# Patient Record
Sex: Male | Born: 1950 | Race: White | Hispanic: No | Smoking: Former smoker
Health system: Southern US, Community
[De-identification: ages and names within clinical notes are randomized; demographics above are authoritative.]

## PROBLEM LIST (undated history)

## (undated) DIAGNOSIS — I219 Acute myocardial infarction, unspecified: Secondary | ICD-10-CM

## (undated) DIAGNOSIS — D649 Anemia, unspecified: Secondary | ICD-10-CM

## (undated) DIAGNOSIS — S6291XA Unspecified fracture of right wrist and hand, initial encounter for closed fracture: Secondary | ICD-10-CM

## (undated) DIAGNOSIS — N289 Disorder of kidney and ureter, unspecified: Secondary | ICD-10-CM

## (undated) DIAGNOSIS — K5730 Diverticulosis of large intestine without perforation or abscess without bleeding: Secondary | ICD-10-CM

## (undated) DIAGNOSIS — G629 Polyneuropathy, unspecified: Secondary | ICD-10-CM

## (undated) DIAGNOSIS — M3390 Dermatopolymyositis, unspecified, organ involvement unspecified: Secondary | ICD-10-CM

## (undated) DIAGNOSIS — K648 Other hemorrhoids: Secondary | ICD-10-CM

## (undated) DIAGNOSIS — K224 Dyskinesia of esophagus: Secondary | ICD-10-CM

## (undated) DIAGNOSIS — J449 Chronic obstructive pulmonary disease, unspecified: Secondary | ICD-10-CM

## (undated) DIAGNOSIS — R51 Headache: Secondary | ICD-10-CM

## (undated) DIAGNOSIS — R569 Unspecified convulsions: Secondary | ICD-10-CM

## (undated) DIAGNOSIS — F329 Major depressive disorder, single episode, unspecified: Secondary | ICD-10-CM

## (undated) DIAGNOSIS — I639 Cerebral infarction, unspecified: Secondary | ICD-10-CM

## (undated) DIAGNOSIS — K5792 Diverticulitis of intestine, part unspecified, without perforation or abscess without bleeding: Secondary | ICD-10-CM

## (undated) DIAGNOSIS — I480 Paroxysmal atrial fibrillation: Secondary | ICD-10-CM

## (undated) DIAGNOSIS — I499 Cardiac arrhythmia, unspecified: Secondary | ICD-10-CM

## (undated) DIAGNOSIS — K222 Esophageal obstruction: Secondary | ICD-10-CM

## (undated) DIAGNOSIS — Z86010 Personal history of colonic polyps: Secondary | ICD-10-CM

## (undated) DIAGNOSIS — I4891 Unspecified atrial fibrillation: Secondary | ICD-10-CM

## (undated) DIAGNOSIS — E669 Obesity, unspecified: Secondary | ICD-10-CM

## (undated) DIAGNOSIS — E785 Hyperlipidemia, unspecified: Secondary | ICD-10-CM

## (undated) DIAGNOSIS — F419 Anxiety disorder, unspecified: Secondary | ICD-10-CM

## (undated) DIAGNOSIS — R42 Dizziness and giddiness: Secondary | ICD-10-CM

## (undated) DIAGNOSIS — Z860101 Personal history of adenomatous and serrated colon polyps: Secondary | ICD-10-CM

## (undated) DIAGNOSIS — R251 Tremor, unspecified: Secondary | ICD-10-CM

## (undated) DIAGNOSIS — N3941 Urge incontinence: Secondary | ICD-10-CM

## (undated) DIAGNOSIS — F319 Bipolar disorder, unspecified: Secondary | ICD-10-CM

## (undated) DIAGNOSIS — D869 Sarcoidosis, unspecified: Secondary | ICD-10-CM

## (undated) DIAGNOSIS — N200 Calculus of kidney: Secondary | ICD-10-CM

## (undated) DIAGNOSIS — E119 Type 2 diabetes mellitus without complications: Secondary | ICD-10-CM

## (undated) DIAGNOSIS — I259 Chronic ischemic heart disease, unspecified: Secondary | ICD-10-CM

## (undated) DIAGNOSIS — I7300 Raynaud's syndrome without gangrene: Secondary | ICD-10-CM

## (undated) DIAGNOSIS — M797 Fibromyalgia: Secondary | ICD-10-CM

## (undated) DIAGNOSIS — M3313 Other dermatomyositis without myopathy: Secondary | ICD-10-CM

## (undated) DIAGNOSIS — F1120 Opioid dependence, uncomplicated: Secondary | ICD-10-CM

## (undated) DIAGNOSIS — F1121 Opioid dependence, in remission: Secondary | ICD-10-CM

## (undated) DIAGNOSIS — J45909 Unspecified asthma, uncomplicated: Secondary | ICD-10-CM

## (undated) DIAGNOSIS — M069 Rheumatoid arthritis, unspecified: Secondary | ICD-10-CM

## (undated) DIAGNOSIS — I10 Essential (primary) hypertension: Secondary | ICD-10-CM

## (undated) DIAGNOSIS — H8090 Unspecified otosclerosis, unspecified ear: Secondary | ICD-10-CM

## (undated) DIAGNOSIS — M359 Systemic involvement of connective tissue, unspecified: Secondary | ICD-10-CM

## (undated) DIAGNOSIS — I609 Nontraumatic subarachnoid hemorrhage, unspecified: Secondary | ICD-10-CM

## (undated) DIAGNOSIS — K219 Gastro-esophageal reflux disease without esophagitis: Secondary | ICD-10-CM

## (undated) DIAGNOSIS — K2970 Gastritis, unspecified, without bleeding: Secondary | ICD-10-CM

## (undated) DIAGNOSIS — C801 Malignant (primary) neoplasm, unspecified: Secondary | ICD-10-CM

## (undated) DIAGNOSIS — F449 Dissociative and conversion disorder, unspecified: Secondary | ICD-10-CM

## (undated) DIAGNOSIS — F429 Obsessive-compulsive disorder, unspecified: Secondary | ICD-10-CM

## (undated) DIAGNOSIS — K449 Diaphragmatic hernia without obstruction or gangrene: Secondary | ICD-10-CM

## (undated) HISTORY — DX: Unspecified convulsions: R56.9

## (undated) HISTORY — PX: CARPAL TUNNEL RELEASE: SHX101

## (undated) HISTORY — DX: Gastro-esophageal reflux disease without esophagitis: K21.9

## (undated) HISTORY — DX: Essential (primary) hypertension: I10

## (undated) HISTORY — DX: Obesity, unspecified: E66.9

## (undated) HISTORY — DX: Raynaud's syndrome without gangrene: I73.00

## (undated) HISTORY — PX: CARDIAC CATHETERIZATION: SHX172

## (undated) HISTORY — DX: Dermatopolymyositis, unspecified, organ involvement unspecified: M33.90

## (undated) HISTORY — DX: Cerebral infarction, unspecified: I63.9

## (undated) HISTORY — DX: Tremor, unspecified: R25.1

## (undated) HISTORY — PX: TONSILLECTOMY: SUR1361

## (undated) HISTORY — DX: Gastritis, unspecified, without bleeding: K29.70

## (undated) HISTORY — DX: Unspecified otosclerosis, unspecified ear: H80.90

## (undated) HISTORY — DX: Personal history of adenomatous and serrated colon polyps: Z86.0101

## (undated) HISTORY — DX: Hyperlipidemia, unspecified: E78.5

## (undated) HISTORY — DX: Paroxysmal atrial fibrillation: I48.0

## (undated) HISTORY — DX: Calculus of kidney: N20.0

## (undated) HISTORY — DX: Rheumatoid arthritis, unspecified: M06.9

## (undated) HISTORY — DX: Diaphragmatic hernia without obstruction or gangrene: K44.9

## (undated) HISTORY — DX: Opioid dependence, uncomplicated: F11.20

## (undated) HISTORY — PX: LUMBAR DISC SURGERY: SHX700

## (undated) HISTORY — DX: Dyskinesia of esophagus: K22.4

## (undated) HISTORY — DX: Other dermatomyositis without myopathy: M33.13

## (undated) HISTORY — DX: Chronic obstructive pulmonary disease, unspecified: J44.9

## (undated) HISTORY — DX: Dizziness and giddiness: R42

## (undated) HISTORY — PX: BACK SURGERY: SHX140

## (undated) HISTORY — DX: Major depressive disorder, single episode, unspecified: F32.9

## (undated) HISTORY — DX: Chronic ischemic heart disease, unspecified: I25.9

## (undated) HISTORY — DX: Urge incontinence: N39.41

## (undated) HISTORY — PX: CATARACT EXTRACTION W/ INTRAOCULAR LENS IMPLANT: SHX1309

## (undated) HISTORY — DX: Other hemorrhoids: K64.8

## (undated) HISTORY — DX: Esophageal obstruction: K22.2

## (undated) HISTORY — DX: Acute myocardial infarction, unspecified: I21.9

## (undated) HISTORY — DX: Diverticulosis of large intestine without perforation or abscess without bleeding: K57.30

## (undated) HISTORY — DX: Diverticulitis of intestine, part unspecified, without perforation or abscess without bleeding: K57.92

## (undated) HISTORY — DX: Fibromyalgia: M79.7

## (undated) HISTORY — DX: Anxiety disorder, unspecified: F41.9

## (undated) HISTORY — DX: Anemia, unspecified: D64.9

## (undated) HISTORY — DX: Personal history of colonic polyps: Z86.010

## (undated) HISTORY — DX: Polyneuropathy, unspecified: G62.9

## (undated) HISTORY — PX: LYMPH NODE DISSECTION: SHX5087

## (undated) HISTORY — DX: Opioid dependence, in remission: F11.21

---

## 1997-12-12 ENCOUNTER — Encounter: Payer: Self-pay | Admitting: Emergency Medicine

## 1997-12-12 ENCOUNTER — Emergency Department (HOSPITAL_COMMUNITY): Admission: EM | Admit: 1997-12-12 | Discharge: 1997-12-12 | Payer: Self-pay | Admitting: Emergency Medicine

## 1998-04-14 ENCOUNTER — Observation Stay (HOSPITAL_COMMUNITY): Admission: RE | Admit: 1998-04-14 | Discharge: 1998-04-15 | Payer: Self-pay | Admitting: Specialist

## 1998-04-14 ENCOUNTER — Encounter: Payer: Self-pay | Admitting: Specialist

## 1998-06-03 ENCOUNTER — Ambulatory Visit (HOSPITAL_COMMUNITY): Admission: RE | Admit: 1998-06-03 | Discharge: 1998-06-03 | Payer: Self-pay | Admitting: Emergency Medicine

## 1998-07-21 ENCOUNTER — Inpatient Hospital Stay (HOSPITAL_COMMUNITY): Admission: EM | Admit: 1998-07-21 | Discharge: 1998-07-23 | Payer: Self-pay | Admitting: Emergency Medicine

## 1998-07-21 ENCOUNTER — Encounter: Payer: Self-pay | Admitting: Emergency Medicine

## 1998-07-22 ENCOUNTER — Encounter: Payer: Self-pay | Admitting: Cardiology

## 1998-07-23 ENCOUNTER — Encounter: Payer: Self-pay | Admitting: Emergency Medicine

## 1998-08-06 ENCOUNTER — Encounter: Payer: Self-pay | Admitting: Internal Medicine

## 1998-08-06 ENCOUNTER — Ambulatory Visit (HOSPITAL_COMMUNITY): Admission: RE | Admit: 1998-08-06 | Discharge: 1998-08-06 | Payer: Self-pay | Admitting: Internal Medicine

## 1998-10-08 ENCOUNTER — Ambulatory Visit (HOSPITAL_COMMUNITY): Admission: RE | Admit: 1998-10-08 | Discharge: 1998-10-08 | Payer: Self-pay | Admitting: Internal Medicine

## 1998-12-19 ENCOUNTER — Ambulatory Visit (HOSPITAL_COMMUNITY): Admission: RE | Admit: 1998-12-19 | Discharge: 1998-12-19 | Payer: Self-pay | Admitting: Internal Medicine

## 1999-02-10 ENCOUNTER — Encounter: Payer: Self-pay | Admitting: Internal Medicine

## 1999-02-10 ENCOUNTER — Ambulatory Visit (HOSPITAL_COMMUNITY): Admission: RE | Admit: 1999-02-10 | Discharge: 1999-02-10 | Payer: Self-pay | Admitting: Internal Medicine

## 1999-06-27 ENCOUNTER — Emergency Department (HOSPITAL_COMMUNITY): Admission: EM | Admit: 1999-06-27 | Discharge: 1999-06-27 | Payer: Self-pay | Admitting: Emergency Medicine

## 1999-06-27 ENCOUNTER — Encounter: Payer: Self-pay | Admitting: *Deleted

## 1999-10-09 ENCOUNTER — Encounter: Payer: Self-pay | Admitting: *Deleted

## 1999-10-09 ENCOUNTER — Emergency Department (HOSPITAL_COMMUNITY): Admission: EM | Admit: 1999-10-09 | Discharge: 1999-10-09 | Payer: Self-pay | Admitting: *Deleted

## 1999-11-17 ENCOUNTER — Encounter (INDEPENDENT_AMBULATORY_CARE_PROVIDER_SITE_OTHER): Payer: Self-pay | Admitting: Specialist

## 1999-11-17 ENCOUNTER — Other Ambulatory Visit: Admission: RE | Admit: 1999-11-17 | Discharge: 1999-11-17 | Payer: Self-pay | Admitting: Internal Medicine

## 2000-01-23 ENCOUNTER — Emergency Department (HOSPITAL_COMMUNITY): Admission: EM | Admit: 2000-01-23 | Discharge: 2000-01-23 | Payer: Self-pay

## 2000-03-20 ENCOUNTER — Emergency Department (HOSPITAL_COMMUNITY): Admission: EM | Admit: 2000-03-20 | Discharge: 2000-03-20 | Payer: Self-pay | Admitting: Emergency Medicine

## 2000-04-01 ENCOUNTER — Encounter: Admission: RE | Admit: 2000-04-01 | Discharge: 2000-04-01 | Payer: Self-pay | Admitting: Internal Medicine

## 2000-04-01 ENCOUNTER — Encounter: Payer: Self-pay | Admitting: Internal Medicine

## 2000-04-12 ENCOUNTER — Encounter: Admission: RE | Admit: 2000-04-12 | Discharge: 2000-04-12 | Payer: Self-pay | Admitting: Internal Medicine

## 2000-04-12 ENCOUNTER — Encounter: Payer: Self-pay | Admitting: Internal Medicine

## 2000-05-02 ENCOUNTER — Emergency Department (HOSPITAL_COMMUNITY): Admission: EM | Admit: 2000-05-02 | Discharge: 2000-05-02 | Payer: Self-pay | Admitting: Emergency Medicine

## 2000-07-07 ENCOUNTER — Encounter: Admission: RE | Admit: 2000-07-07 | Discharge: 2000-07-07 | Payer: Self-pay | Admitting: Internal Medicine

## 2000-07-07 ENCOUNTER — Encounter: Payer: Self-pay | Admitting: Internal Medicine

## 2000-11-02 ENCOUNTER — Encounter: Payer: Self-pay | Admitting: Internal Medicine

## 2000-11-02 ENCOUNTER — Encounter: Admission: RE | Admit: 2000-11-02 | Discharge: 2000-11-02 | Payer: Self-pay | Admitting: Internal Medicine

## 2001-08-28 ENCOUNTER — Encounter: Admission: RE | Admit: 2001-08-28 | Discharge: 2001-08-28 | Payer: Self-pay | Admitting: Internal Medicine

## 2001-08-28 ENCOUNTER — Encounter: Payer: Self-pay | Admitting: Internal Medicine

## 2002-01-18 ENCOUNTER — Emergency Department (HOSPITAL_COMMUNITY): Admission: EM | Admit: 2002-01-18 | Discharge: 2002-01-18 | Payer: Self-pay | Admitting: Emergency Medicine

## 2002-01-18 ENCOUNTER — Encounter: Payer: Self-pay | Admitting: Emergency Medicine

## 2002-03-02 ENCOUNTER — Encounter: Admission: RE | Admit: 2002-03-02 | Discharge: 2002-03-02 | Payer: Self-pay | Admitting: Emergency Medicine

## 2002-03-02 ENCOUNTER — Encounter: Payer: Self-pay | Admitting: Emergency Medicine

## 2002-03-25 ENCOUNTER — Emergency Department (HOSPITAL_COMMUNITY): Admission: EM | Admit: 2002-03-25 | Discharge: 2002-03-25 | Payer: Self-pay | Admitting: Emergency Medicine

## 2002-04-12 ENCOUNTER — Encounter: Payer: Self-pay | Admitting: Emergency Medicine

## 2002-04-12 ENCOUNTER — Emergency Department (HOSPITAL_COMMUNITY): Admission: EM | Admit: 2002-04-12 | Discharge: 2002-04-12 | Payer: Self-pay | Admitting: Emergency Medicine

## 2002-05-02 ENCOUNTER — Encounter: Payer: Self-pay | Admitting: Emergency Medicine

## 2002-05-02 ENCOUNTER — Inpatient Hospital Stay (HOSPITAL_COMMUNITY): Admission: EM | Admit: 2002-05-02 | Discharge: 2002-05-03 | Payer: Self-pay

## 2002-07-23 ENCOUNTER — Encounter: Payer: Self-pay | Admitting: Emergency Medicine

## 2002-07-23 ENCOUNTER — Encounter: Admission: RE | Admit: 2002-07-23 | Discharge: 2002-07-23 | Payer: Self-pay | Admitting: Emergency Medicine

## 2002-07-30 ENCOUNTER — Encounter: Payer: Self-pay | Admitting: Radiology

## 2002-07-30 ENCOUNTER — Encounter: Payer: Self-pay | Admitting: Emergency Medicine

## 2002-07-30 ENCOUNTER — Encounter: Admission: RE | Admit: 2002-07-30 | Discharge: 2002-07-30 | Payer: Self-pay | Admitting: Emergency Medicine

## 2002-08-14 ENCOUNTER — Encounter: Payer: Self-pay | Admitting: Emergency Medicine

## 2002-08-14 ENCOUNTER — Encounter: Admission: RE | Admit: 2002-08-14 | Discharge: 2002-08-14 | Payer: Self-pay | Admitting: Emergency Medicine

## 2002-08-28 ENCOUNTER — Emergency Department (HOSPITAL_COMMUNITY): Admission: EM | Admit: 2002-08-28 | Discharge: 2002-08-28 | Payer: Self-pay | Admitting: Emergency Medicine

## 2002-08-31 ENCOUNTER — Encounter (HOSPITAL_COMMUNITY): Admission: EM | Admit: 2002-08-31 | Discharge: 2002-10-02 | Payer: Self-pay | Admitting: Emergency Medicine

## 2002-11-01 ENCOUNTER — Encounter: Payer: Self-pay | Admitting: *Deleted

## 2002-11-06 ENCOUNTER — Ambulatory Visit (HOSPITAL_COMMUNITY): Admission: RE | Admit: 2002-11-06 | Discharge: 2002-11-06 | Payer: Self-pay | Admitting: *Deleted

## 2003-10-17 ENCOUNTER — Ambulatory Visit (HOSPITAL_COMMUNITY): Admission: RE | Admit: 2003-10-17 | Discharge: 2003-10-17 | Payer: Self-pay | Admitting: Endocrinology

## 2003-10-21 ENCOUNTER — Emergency Department (HOSPITAL_COMMUNITY): Admission: EM | Admit: 2003-10-21 | Discharge: 2003-10-21 | Payer: Self-pay | Admitting: *Deleted

## 2003-11-05 ENCOUNTER — Ambulatory Visit (HOSPITAL_COMMUNITY): Admission: RE | Admit: 2003-11-05 | Discharge: 2003-11-05 | Payer: Self-pay | Admitting: Endocrinology

## 2003-11-05 ENCOUNTER — Encounter (INDEPENDENT_AMBULATORY_CARE_PROVIDER_SITE_OTHER): Payer: Self-pay | Admitting: *Deleted

## 2003-11-19 ENCOUNTER — Encounter: Payer: Self-pay | Admitting: Internal Medicine

## 2003-11-25 ENCOUNTER — Inpatient Hospital Stay (HOSPITAL_COMMUNITY): Admission: AD | Admit: 2003-11-25 | Discharge: 2003-11-27 | Payer: Self-pay | Admitting: Internal Medicine

## 2003-11-26 ENCOUNTER — Encounter: Payer: Self-pay | Admitting: Internal Medicine

## 2003-12-10 ENCOUNTER — Encounter: Payer: Self-pay | Admitting: Internal Medicine

## 2004-10-07 IMAGING — CT CT ABDOMEN W/ CM
1 of 4 series · 14 of 32 positions shown, 19 images · IV contrast (omnipaque)
Comparison: none

FINDINGS
CLINICAL DATA: LEFT LOWER QUADRANT PAIN.
CT SCAN OF THE ABDOMEN AND PELVIS WITH CONTRAST
CT SCAN OF THE ABDOMEN WITH CONTRAST:
MULTIPLE SPIRAL IMAGES WERE MADE THROUGH THE ABDOMEN AFTER INTRAVENOUS INJECTION OF 150 CC OF
OMNIPAQUE 300.  ORAL CONTRAST WAS ALSO USED.
THE LUNG BASES ARE NORMAL.  THE LIVER, SPLEEN, AND PANCREAS ARE NORMAL.  THERE IS NO ABNORMALITY OF
THE LEFT KIDNEY OR RETROPERITONEAL STRUCTURES.  THERE IS A 2 CM SIMPLE CYST OF THE RIGHT KIDNEY.
THERE IS NO FREE AIR OR FREE FLUID.
IMPRESSION
NEGATIVE CT SCAN OF THE ABDOMEN WITH CONTRAST.
CT SCAN OF THE PELVIS WITH CONTRAST:
ADDITIONAL IMAGES THROUGH THE PELVIS AFTER ORAL AND INTRAVENOUS CONTRAST DEMONSTRATE DIVERTICULAR
CHANGE OF THE SIGMOID COLON WITH NO DIVERTICULITIS.  THE APPENDIX IS NORMAL.
1.   DIVERTICULAR CHANGE OF THE SIGMOID COLON WITH NO DIVERTICULITIS.
2.   NORMAL APPENDIX.

[Series 2: abd/pelvis 5.0 b30f · axial · 0.95mm/px · z∈[-674,-268]mm · 14 of 93 slices shown, 19 images]
[im 6/93  soft-tissue]
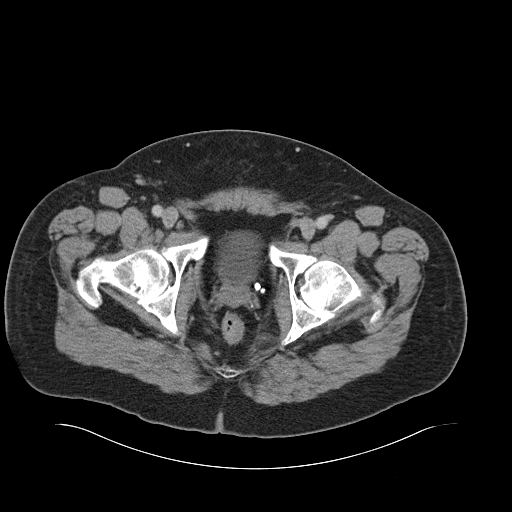
[im 6/93  bone]
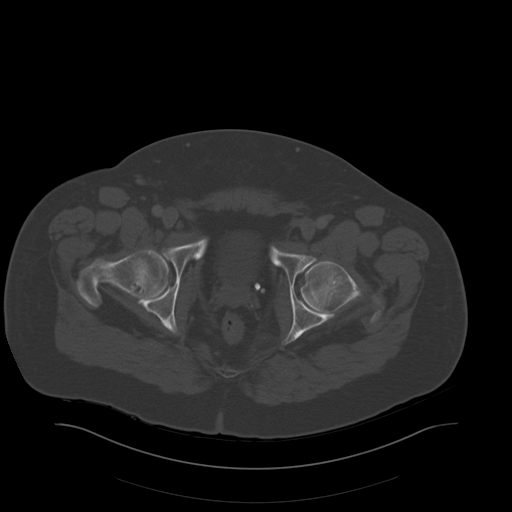
[im 11/93  soft-tissue]
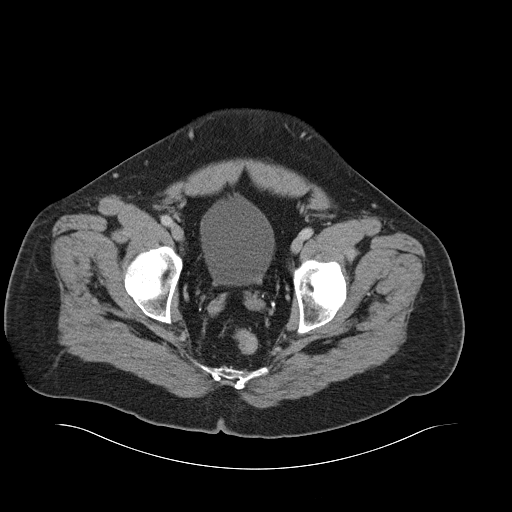
[im 22/93  soft-tissue]
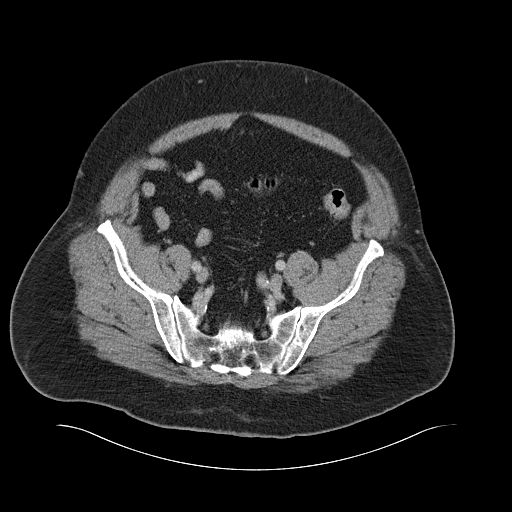
[im 28/93  soft-tissue]
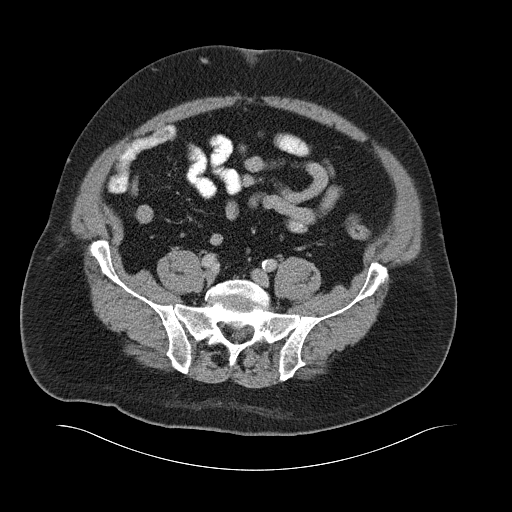
[im 33/93  soft-tissue]
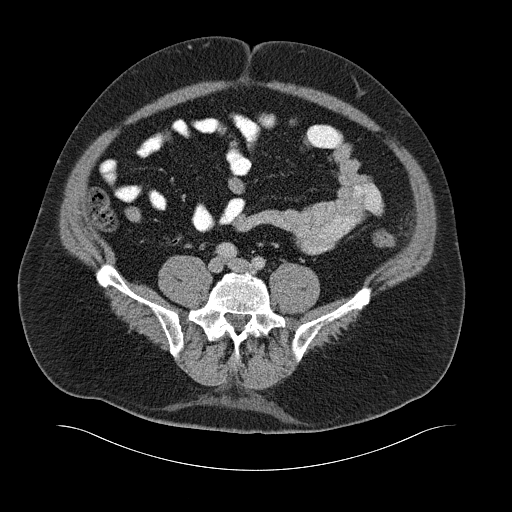
[im 38/93  soft-tissue]
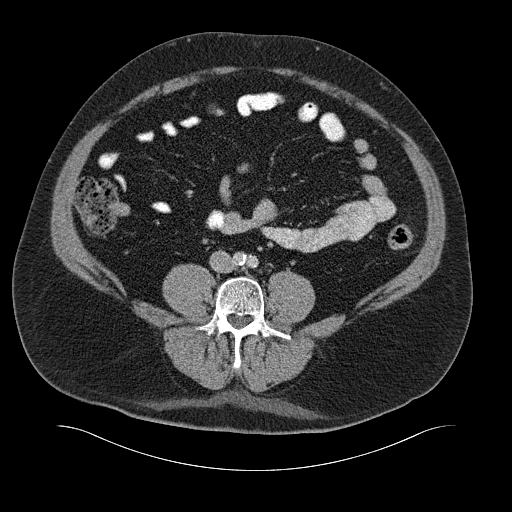
[im 49/93  soft-tissue]
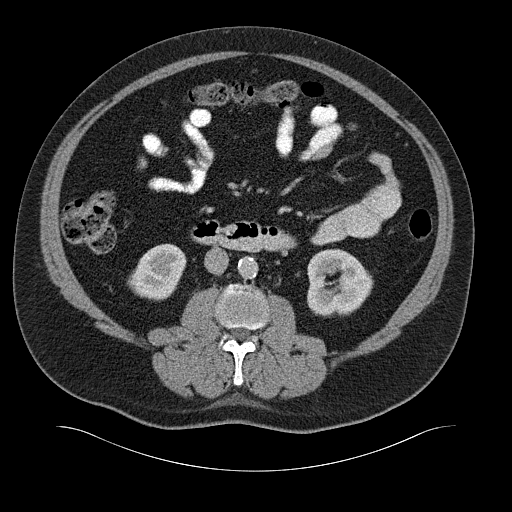
[im 55/93  soft-tissue]
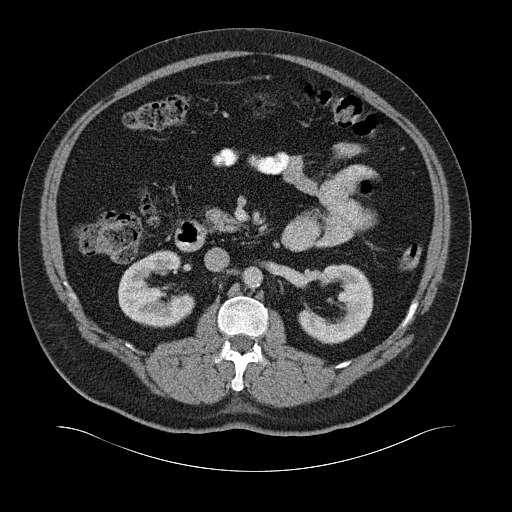
[im 60/93  soft-tissue]
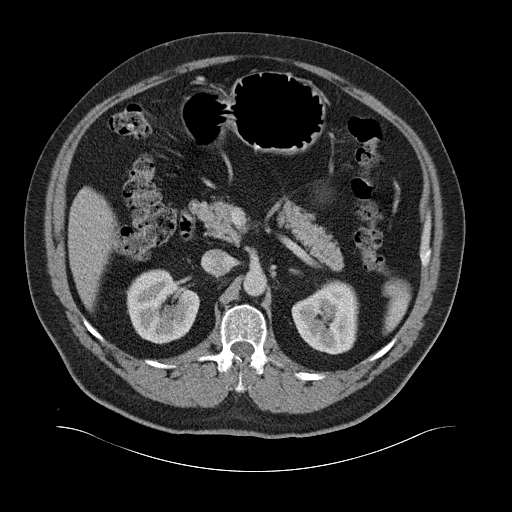
[im 60/93  bone]
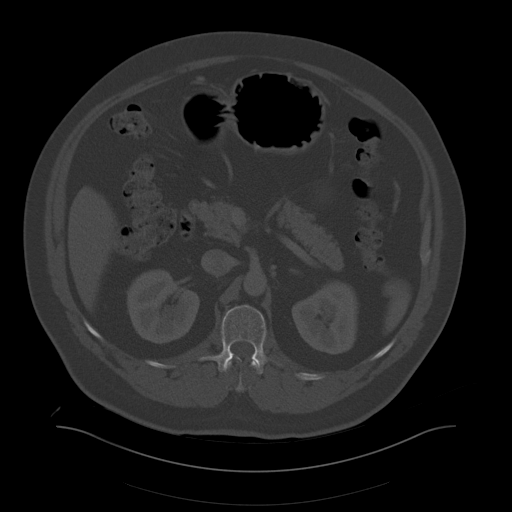
[im 65/93  soft-tissue]
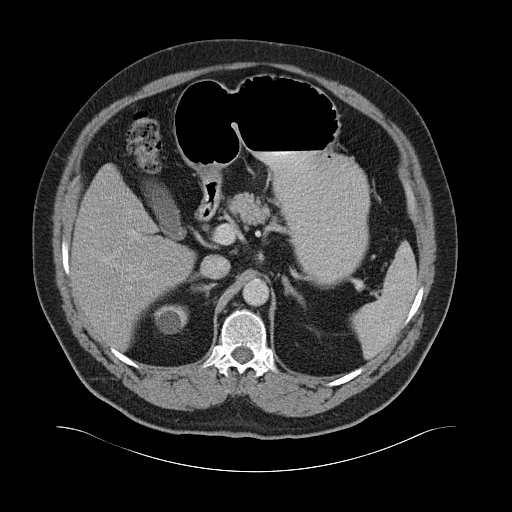
[im 71/93  soft-tissue]
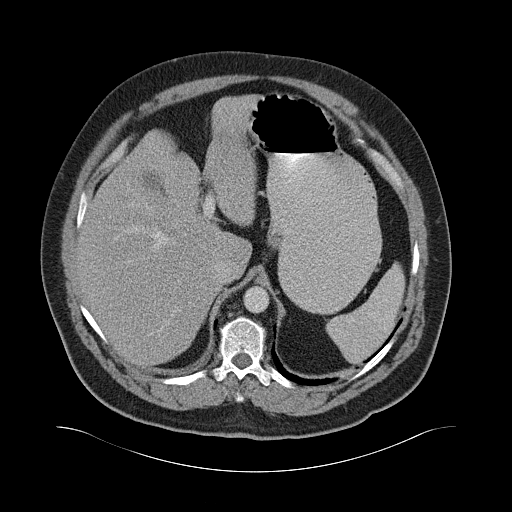
[im 71/93  lung]
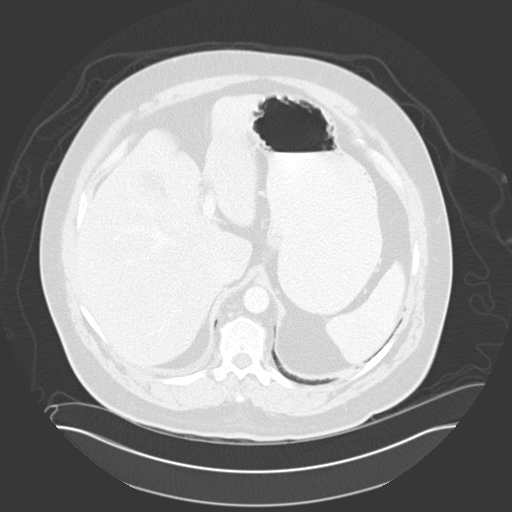
[im 76/93  lung]
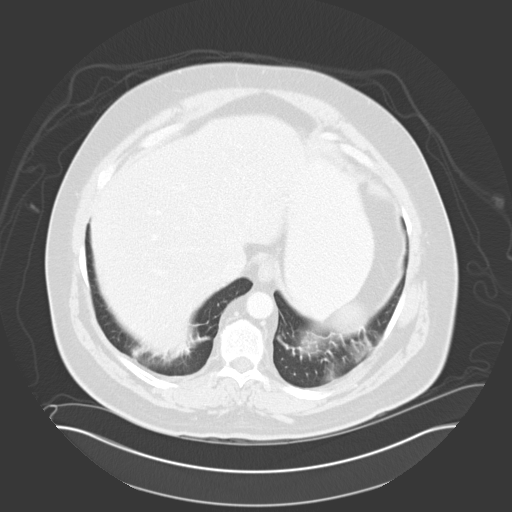
[im 82/93  soft-tissue]
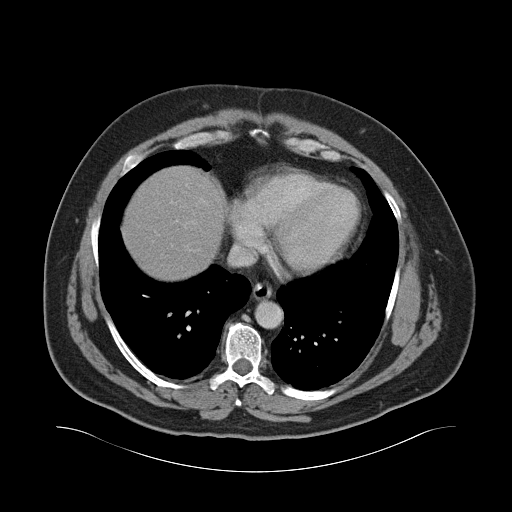
[im 82/93  lung]
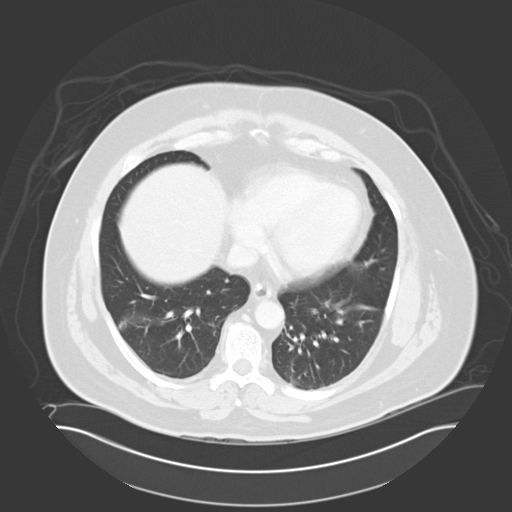
[im 87/93  soft-tissue]
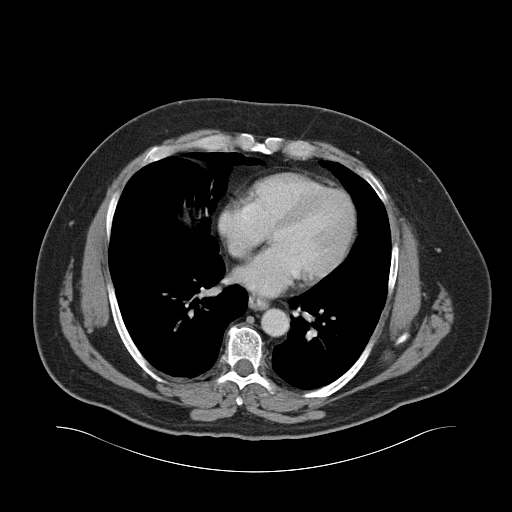
[im 87/93  lung]
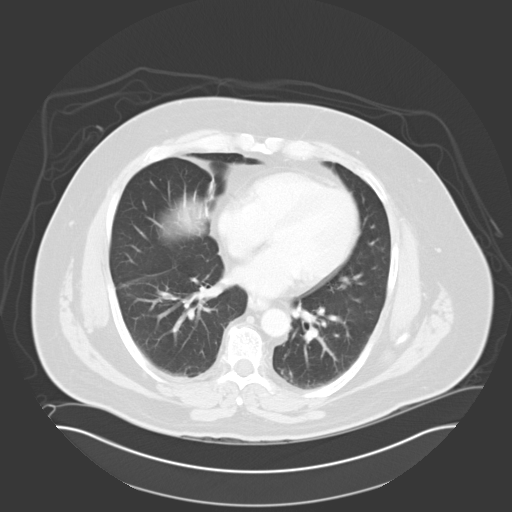

[14 of 32 positions shown; findings below may reference images not displayed]

## 2005-06-08 ENCOUNTER — Ambulatory Visit (HOSPITAL_COMMUNITY): Admission: RE | Admit: 2005-06-08 | Discharge: 2005-06-08 | Payer: Self-pay

## 2005-06-08 ENCOUNTER — Encounter (INDEPENDENT_AMBULATORY_CARE_PROVIDER_SITE_OTHER): Payer: Self-pay | Admitting: *Deleted

## 2005-07-22 ENCOUNTER — Ambulatory Visit: Payer: Self-pay | Admitting: Cardiology

## 2005-08-10 ENCOUNTER — Ambulatory Visit: Payer: Self-pay | Admitting: Cardiology

## 2005-08-11 ENCOUNTER — Encounter (INDEPENDENT_AMBULATORY_CARE_PROVIDER_SITE_OTHER): Payer: Self-pay | Admitting: *Deleted

## 2005-08-11 ENCOUNTER — Ambulatory Visit: Payer: Self-pay | Admitting: Internal Medicine

## 2005-08-11 ENCOUNTER — Ambulatory Visit: Payer: Self-pay | Admitting: Cardiology

## 2005-08-11 ENCOUNTER — Ambulatory Visit: Payer: Self-pay

## 2005-12-29 ENCOUNTER — Ambulatory Visit (HOSPITAL_COMMUNITY): Admission: RE | Admit: 2005-12-29 | Discharge: 2005-12-30 | Payer: Self-pay | Admitting: Otolaryngology

## 2005-12-29 ENCOUNTER — Encounter (INDEPENDENT_AMBULATORY_CARE_PROVIDER_SITE_OTHER): Payer: Self-pay | Admitting: Specialist

## 2006-01-26 ENCOUNTER — Encounter: Admission: RE | Admit: 2006-01-26 | Discharge: 2006-01-26 | Payer: Self-pay | Admitting: Dentistry

## 2006-01-26 ENCOUNTER — Ambulatory Visit: Payer: Self-pay | Admitting: Dentistry

## 2006-03-04 ENCOUNTER — Ambulatory Visit: Payer: Self-pay | Admitting: Dentistry

## 2006-03-16 ENCOUNTER — Encounter (INDEPENDENT_AMBULATORY_CARE_PROVIDER_SITE_OTHER): Payer: Self-pay | Admitting: Specialist

## 2006-03-16 ENCOUNTER — Ambulatory Visit (HOSPITAL_BASED_OUTPATIENT_CLINIC_OR_DEPARTMENT_OTHER): Admission: RE | Admit: 2006-03-16 | Discharge: 2006-03-17 | Payer: Self-pay | Admitting: Otolaryngology

## 2006-04-11 ENCOUNTER — Ambulatory Visit: Payer: Self-pay | Admitting: Dentistry

## 2006-04-13 IMAGING — CR DG CHEST 2V
2 series · 2 of 2 positions shown · non-contrast
Comparison: 05/02/02.

CLINICAL DATA: Chest pain, shortness of breath, fever, and diabetes.
 TWO VIEW CHEST   - 10/17/03

[view not recorded (1 of 2)]
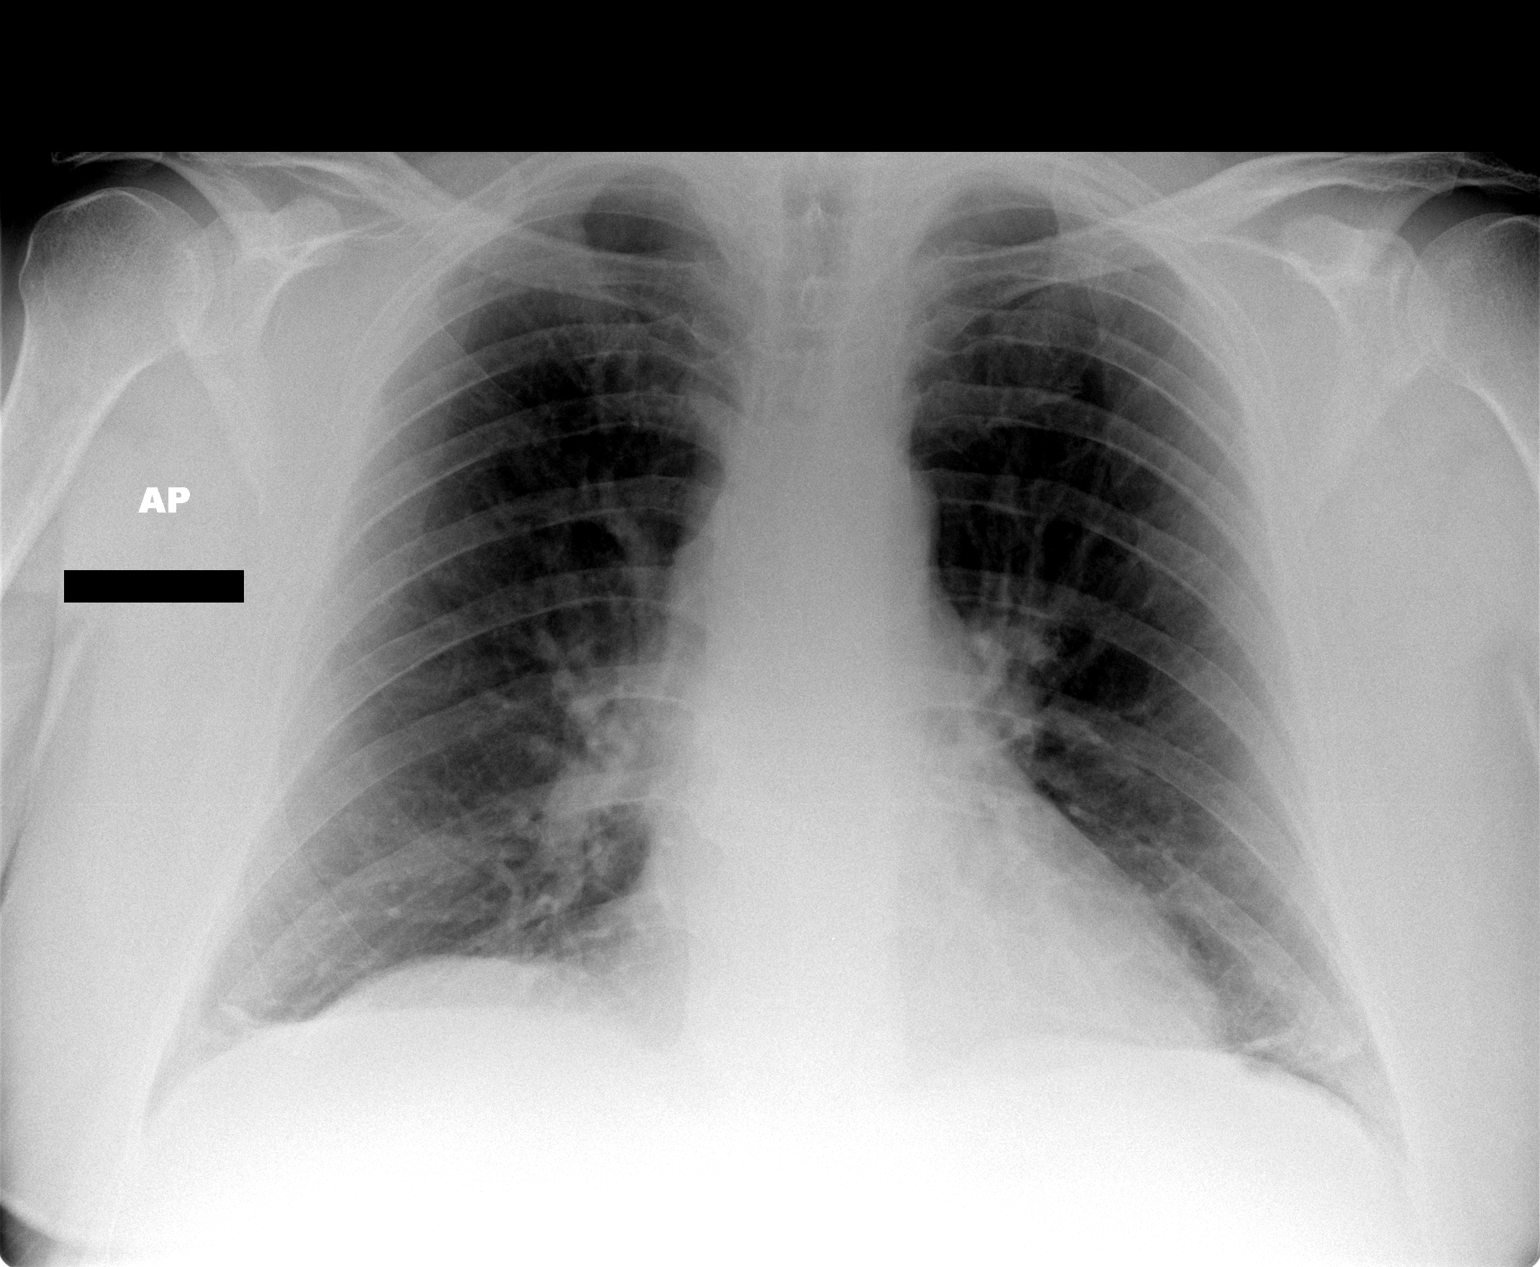

[view not recorded (2 of 2)]
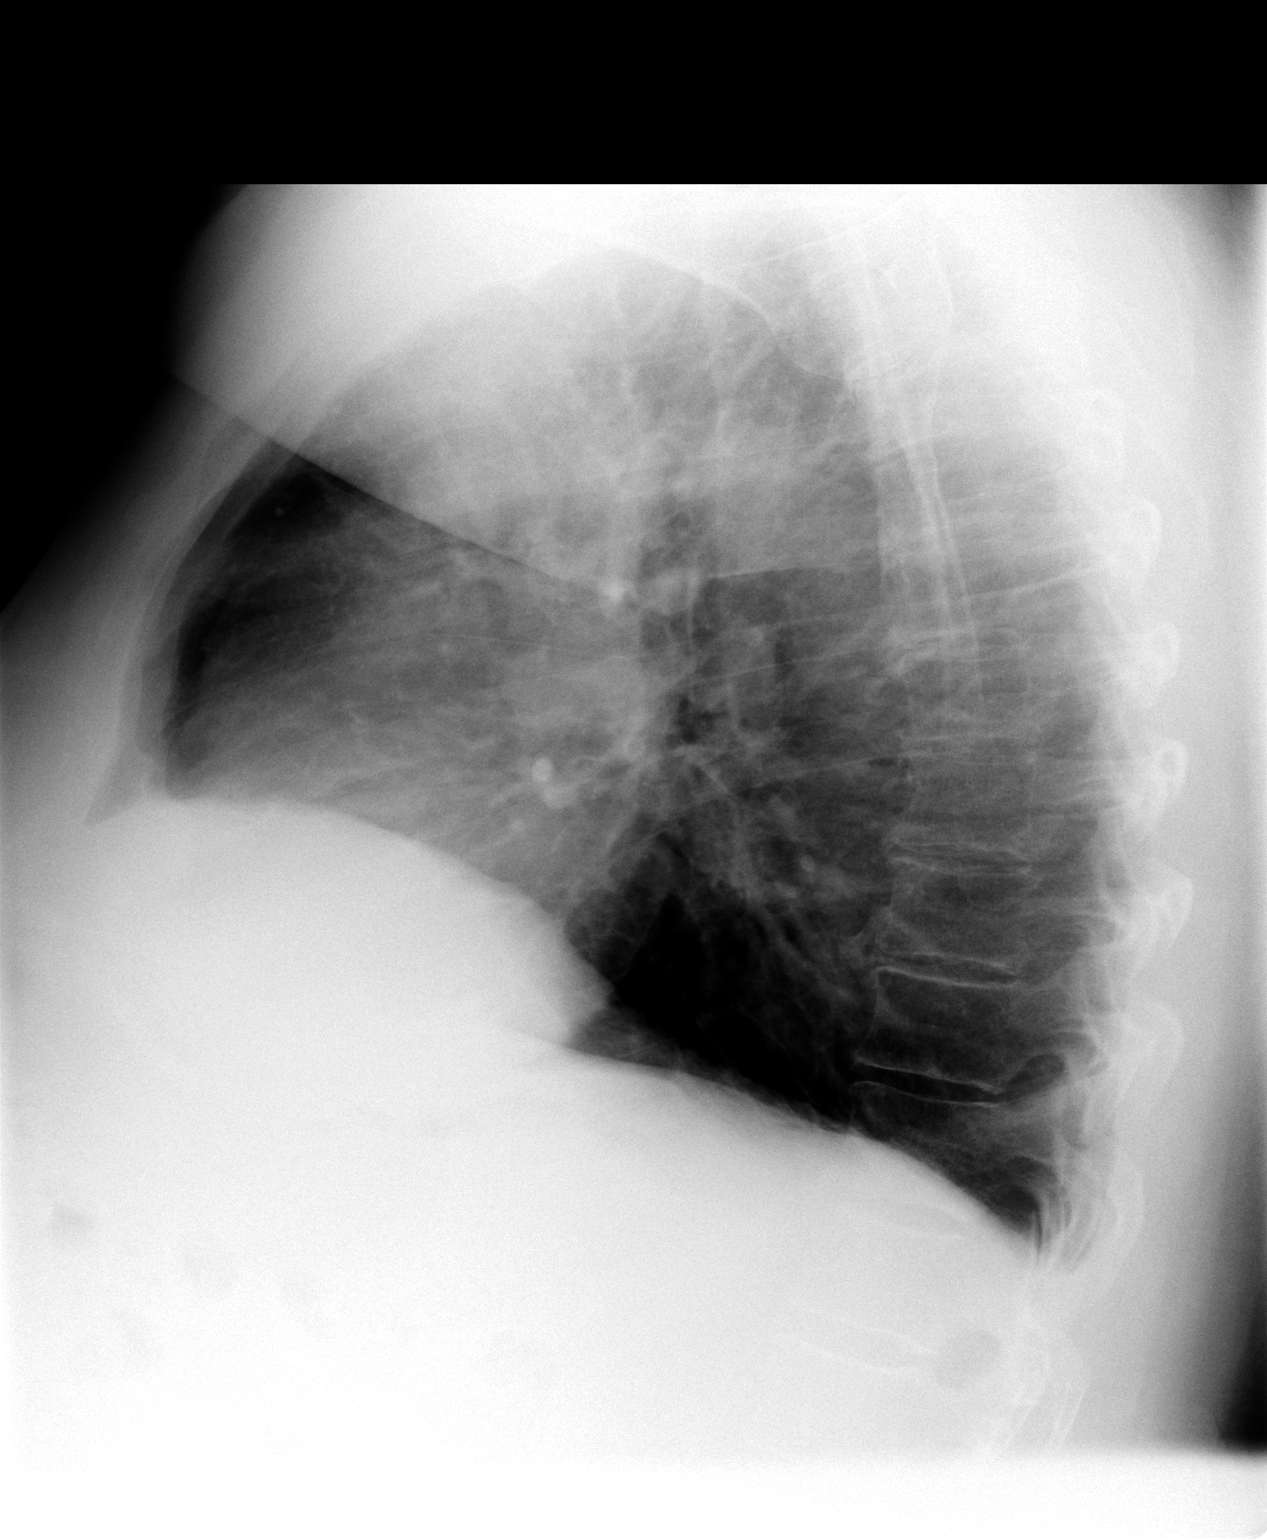

[2 of 2 positions shown; findings below may reference images not displayed]

Two views show minimal scarring and/or atelectasis at both lung bases.  No evidence of edema, infiltrate, nodule, or pleural effusion.  The heart size is normal.  The bony thorax shows mild degenerative changes of the thoracic spine.
 IMPRESSION
 Mild bibasilar scarring/atelectasis.  No active disease.

## 2006-04-14 ENCOUNTER — Ambulatory Visit (HOSPITAL_COMMUNITY): Admission: RE | Admit: 2006-04-14 | Discharge: 2006-04-14 | Payer: Self-pay

## 2006-04-17 IMAGING — CR DG CHEST 2V
2 series · 2 of 2 positions shown · non-contrast
Comparison: none

CLINICAL DATA: Chest pain.
 TWO VIEW CHEST 
 Unremarkable cardiomediastinal silhouette.  Lungs are clear.  No evidence of pleural effusions or pneumothorax. 
 IMPRESSION
 No evidence of active cardiopulmonary disease.

[view not recorded (1 of 2)]
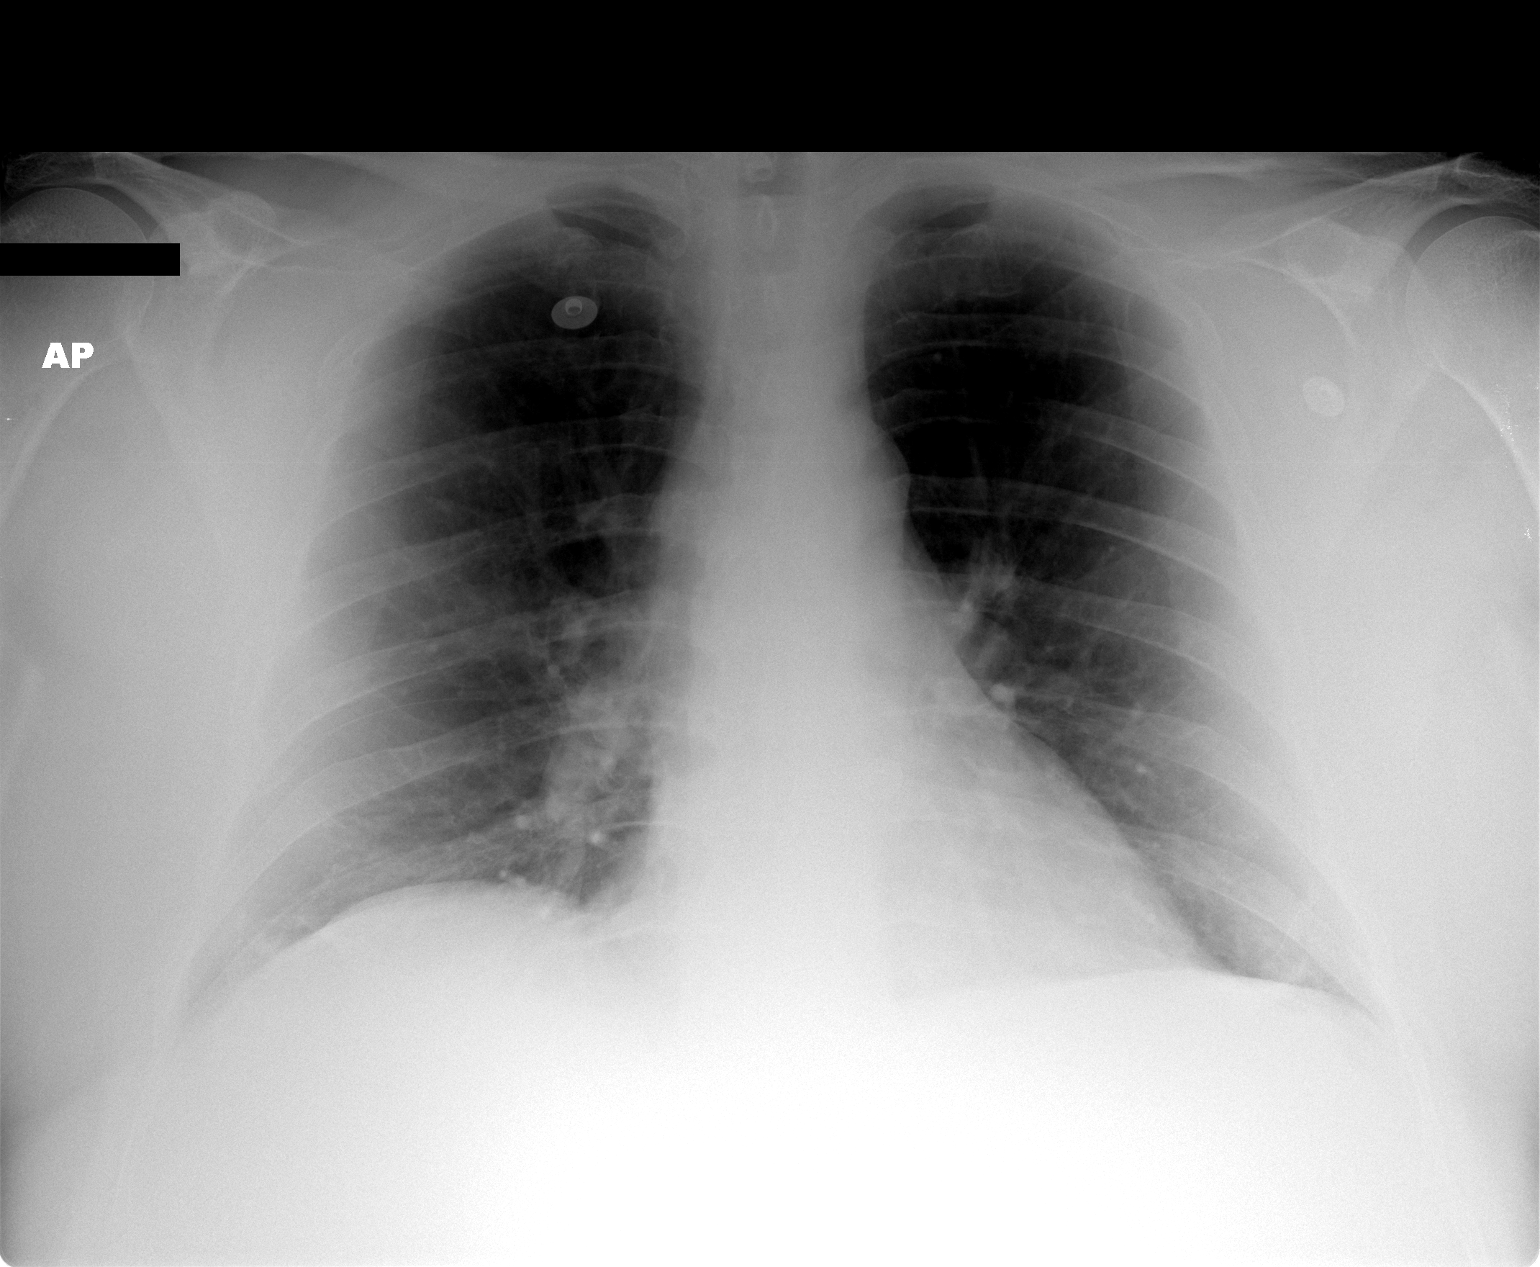

[view not recorded (2 of 2)]
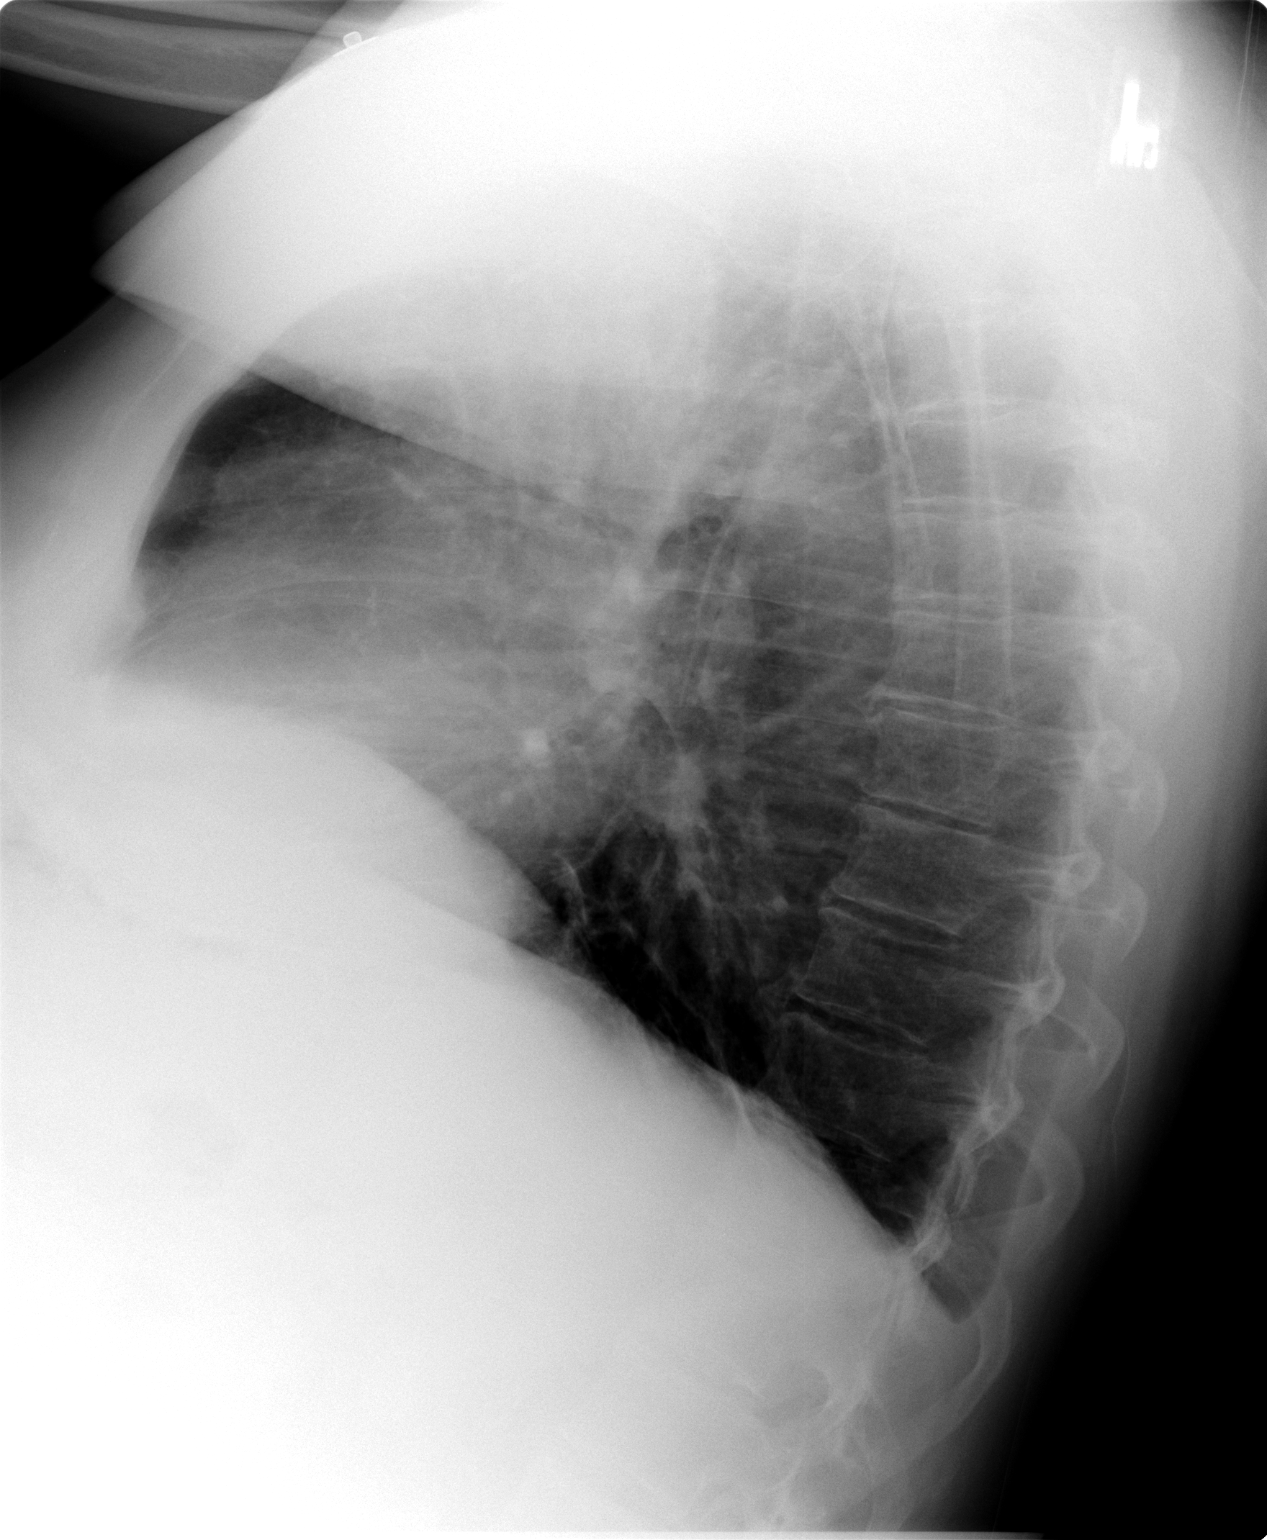

[2 of 2 positions shown; findings below may reference images not displayed]

## 2006-05-02 IMAGING — CT CT PELVIS W/ CM
1 of 3 series · 14 of 32 positions shown, 19 images · IV contrast (omnipaque)
Comparison: 04/12/02.

CLINICAL DATA: Left lower quadrant pain/fever/history of diverticulitis.
TECHNIQUE: Multidetector helical imaging carried out through the abdomen and pelvis utilizing oral and IV contrast ? 150 cc of Omnipaque 300.

[Series 2: abd/pelvis 5.0 b10f · axial · 0.86mm/px · z∈[+1435,+1870]mm · 14 of 97 slices shown, 19 images]
[im 5/97  soft-tissue]
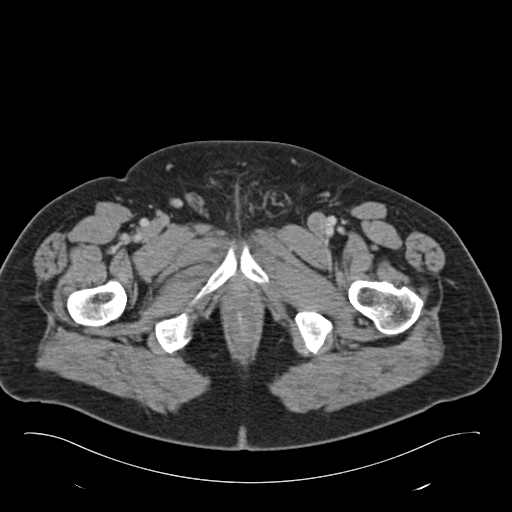
[im 5/97  bone]
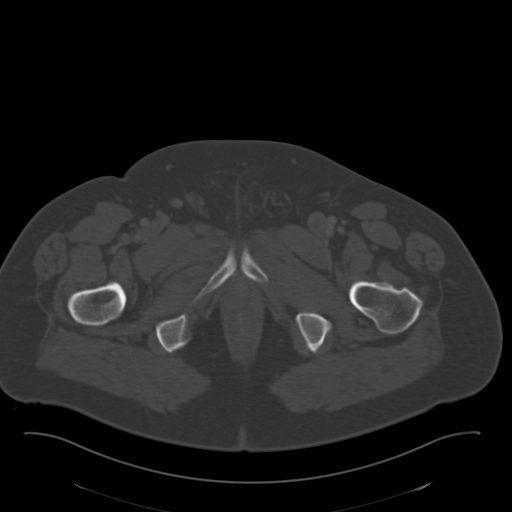
[im 14/97  soft-tissue]
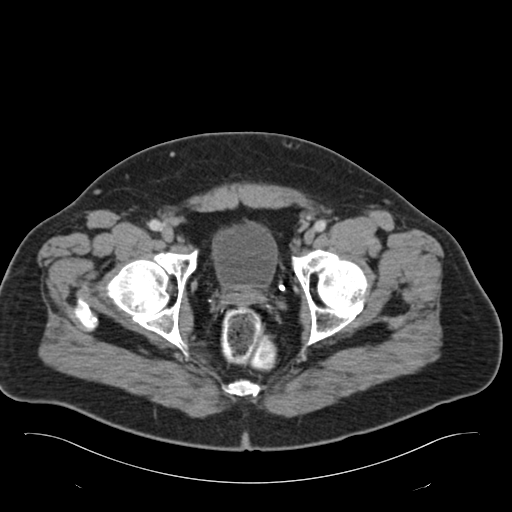
[im 19/97  soft-tissue]
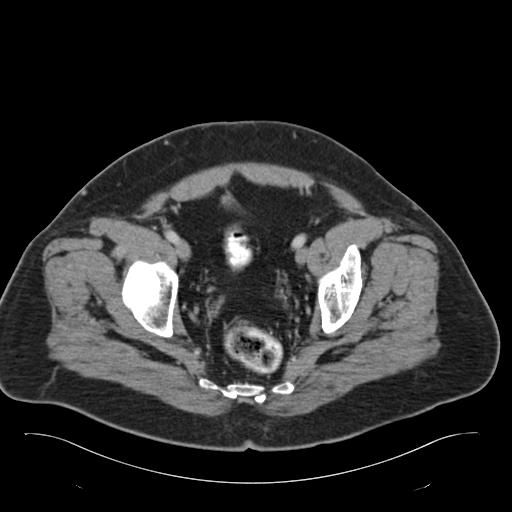
[im 28/97  soft-tissue]
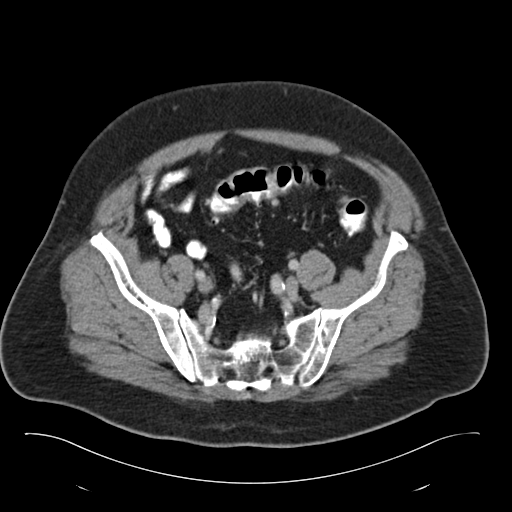
[im 33/97  soft-tissue]
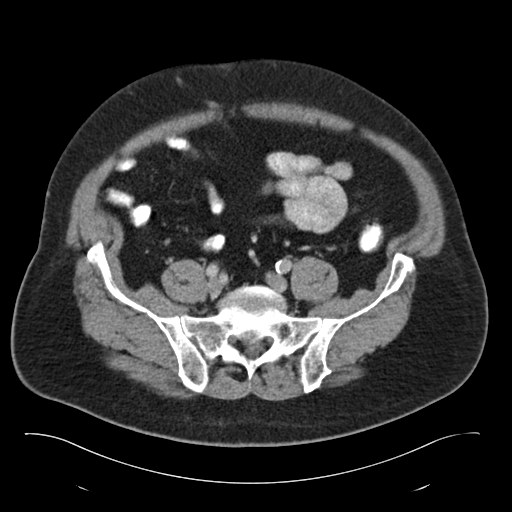
[im 42/97  soft-tissue]
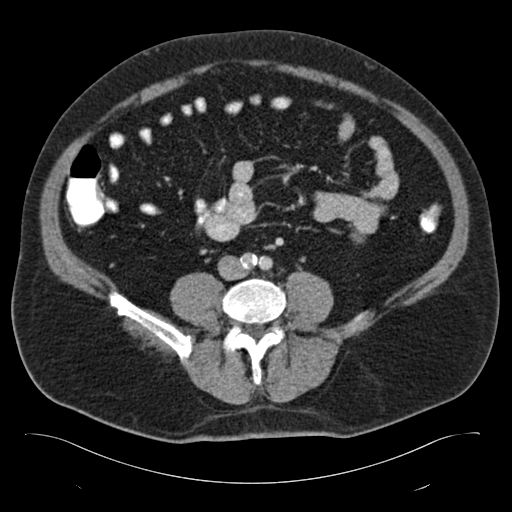
[im 51/97  soft-tissue]
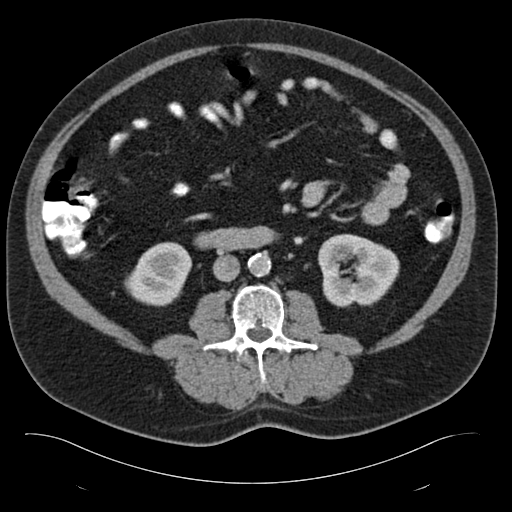
[im 55/97  soft-tissue]
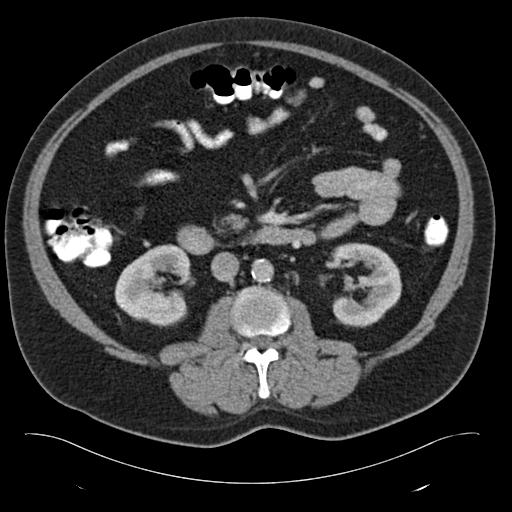
[im 65/97  soft-tissue]
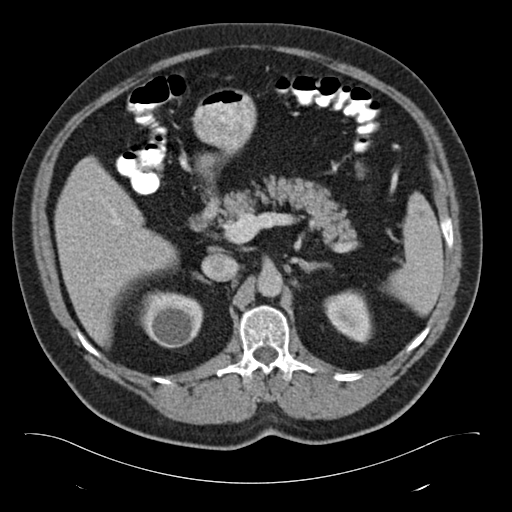
[im 65/97  bone]
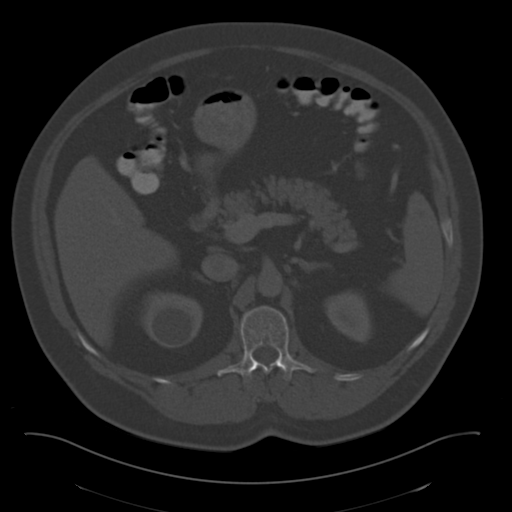
[im 69/97  soft-tissue]
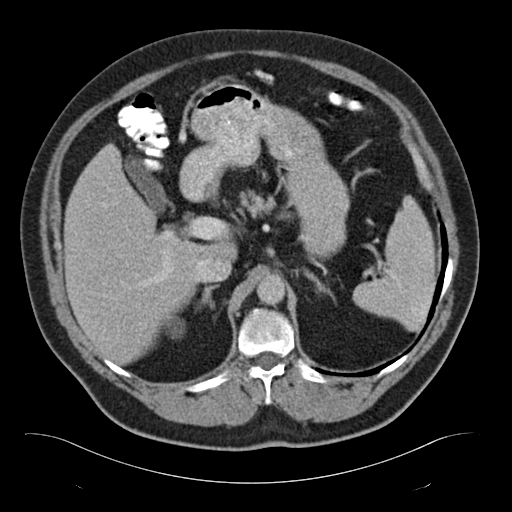
[im 78/97  soft-tissue]
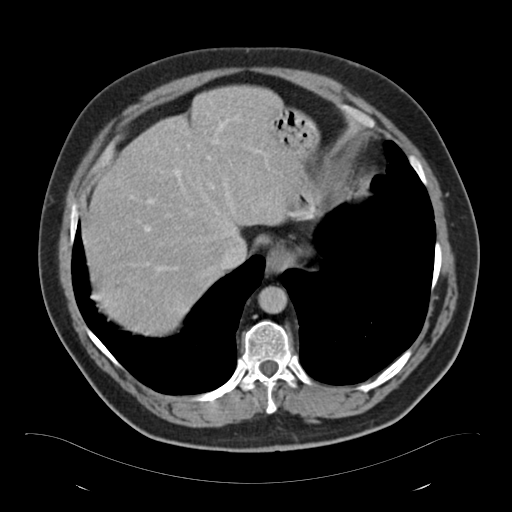
[im 78/97  lung]
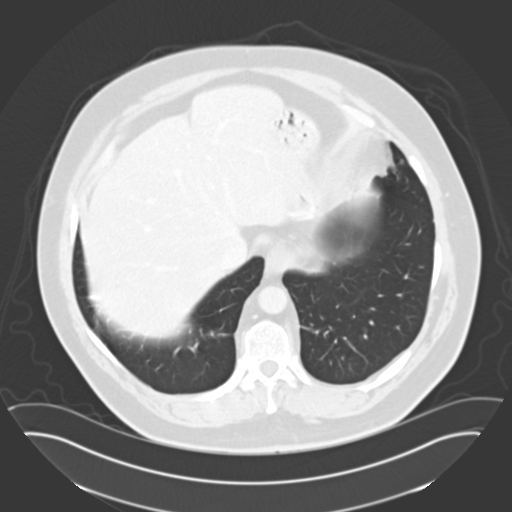
[im 83/97  soft-tissue]
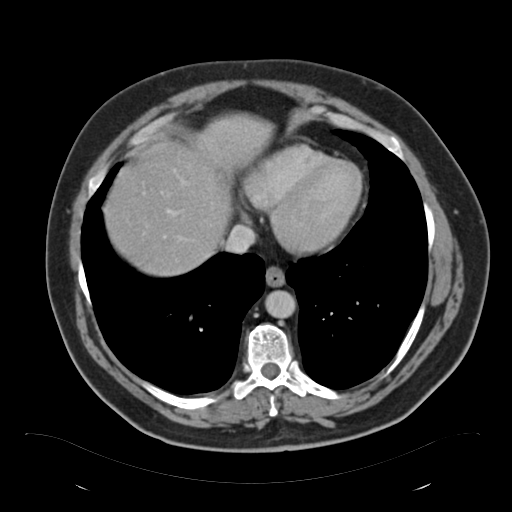
[im 83/97  lung]
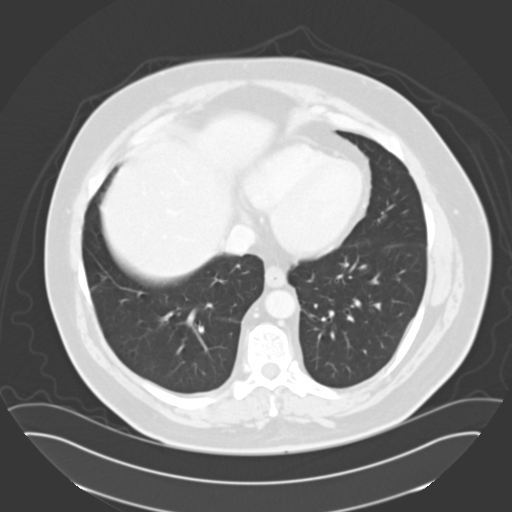
[im 87/97  lung]
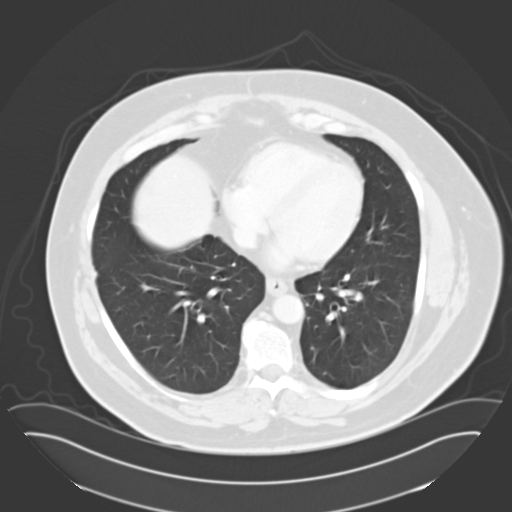
[im 92/97  soft-tissue]
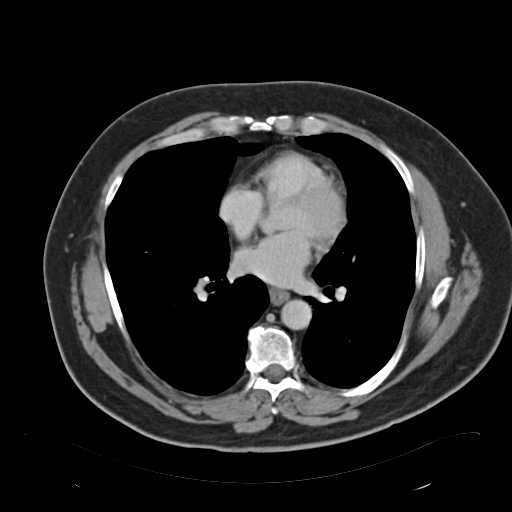
[im 92/97  lung]
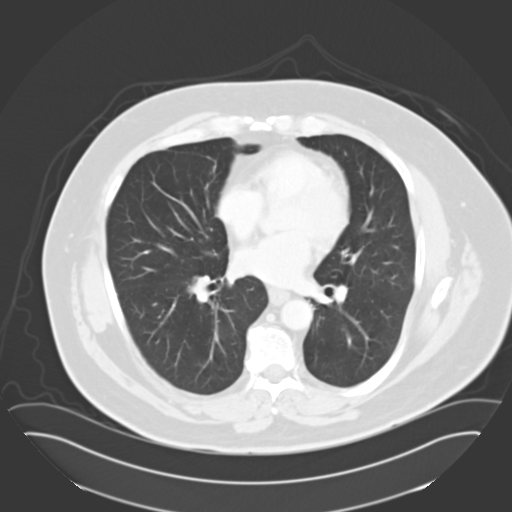

[14 of 32 positions shown; findings below may reference images not displayed]

CT ABDOMEN WITH CONTRAST 
 Lung bases clear.  Liver, spleen, pancreas, and adrenals normal.  There is a 3 cm simple cyst involving the upper pole of the right kidney.  This was present on the prior study, although it is currently larger.  Nonetheless, it is fluid density and well demarcated, and it is still a simple cyst, even though larger.  No adenopathy or ascites.  
 IMPRESSION
 No acute or significant findings ? There is a simple cyst involving the upper pole of the right kidney, that is larger when compared to a prior study in March 2002.
 CT PELVIS WITH CONTRAST 
 No focal masses, adenopathy, or fluid collections.  There are changes of diverticulosis of the descending colon and the sigmoid colon.  I do not see definite changes of diverticulitis.  However, there is subtle stranding of the pericolonic fat of the lower descending colon noted on images 62 through 64.  I cannot say that this was present on the patient?s prior scan.  Therefore, this could represent early inflammatory change of diverticulitis.  However, this is not a definitive abnormality. 
 No other findings of significance. 
 IMPRESSION
 Diverticular disease of the lower left colon with possible early/subtle changes of diverticulitis of the distal descending colon.  See report.

## 2006-10-25 ENCOUNTER — Ambulatory Visit (HOSPITAL_COMMUNITY): Admission: RE | Admit: 2006-10-25 | Discharge: 2006-10-25 | Payer: Self-pay

## 2007-02-16 HISTORY — PX: KNEE ARTHROSCOPY W/ MENISCAL REPAIR: SHX1877

## 2007-11-02 ENCOUNTER — Telehealth: Payer: Self-pay | Admitting: Internal Medicine

## 2007-12-04 IMAGING — US US ABDOMEN COMPLETE
1 series · 14 of 25 positions shown · non-contrast
Comparison: none

CLINICAL DATA: Hypertension.  Diabetes.  Abdominal pain.  Question abdominal aortic aneurysm.
 ABDOMEN ULTRASOUND:
TECHNIQUE: Complete abdominal ultrasound examination was performed including evaluation of the liver, gallbladder, bile ducts, pancreas, kidneys, spleen, IVC, and abdominal aorta.
 The gallbladder has a normal appearance.  The common bile duct is normal in caliber measuring 6mm in diameter.  The liver and inferior vena cava have a normal appearance.  The pancreas is not fully visualized due to overlying bowel gas.  Portions of the head and body of the pancreas are visualized and have a normal appearance.  The spleen and kidneys are normal in size.  The right kidney measures 13.6cm in length and the left kidney 13.3cm in length.  There is a 4.7cm in size simple-appearing upper pole right renal cyst.  The abdominal aorta is not well visualized distally due to overlying gas.  The proximal abdominal aorta measures 2.9cm in diameter.

[Series 1: unknown · 0.41mm/px · 14 of 82 slices shown]
[im 1/82]
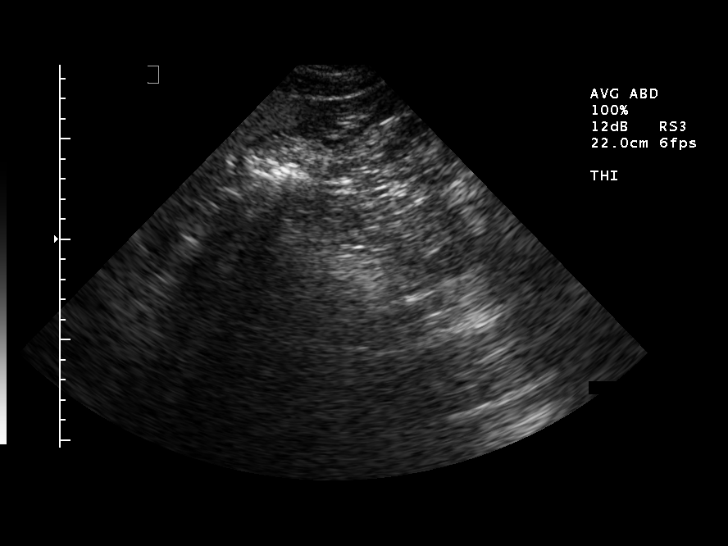
[im 7/82]
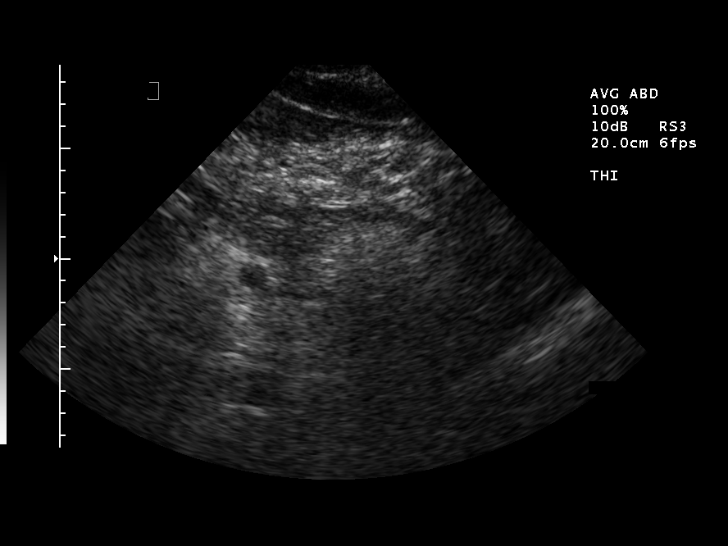
[im 14/82]
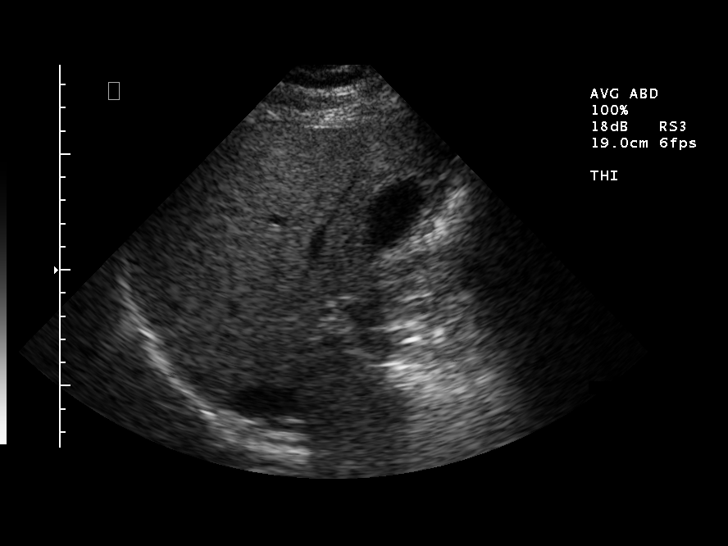
[im 21/82]
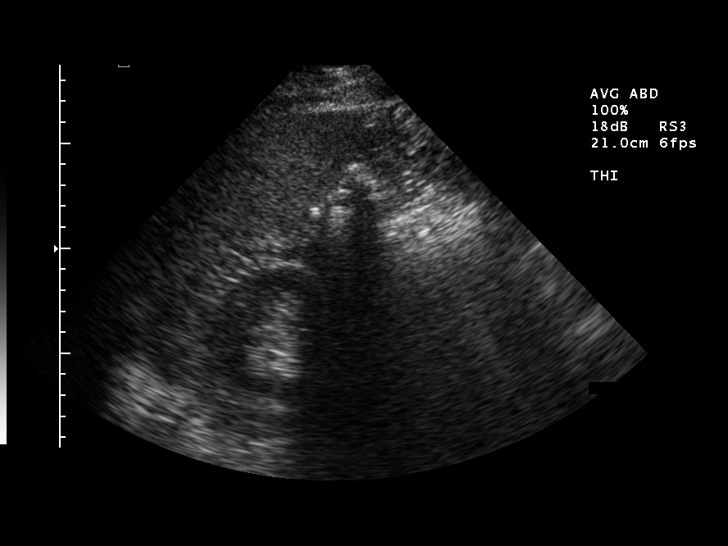
[im 28/82]
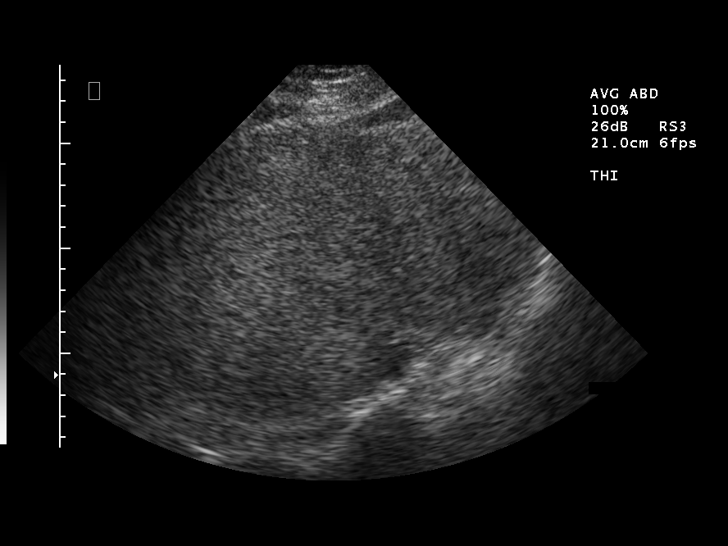
[im 31/82]
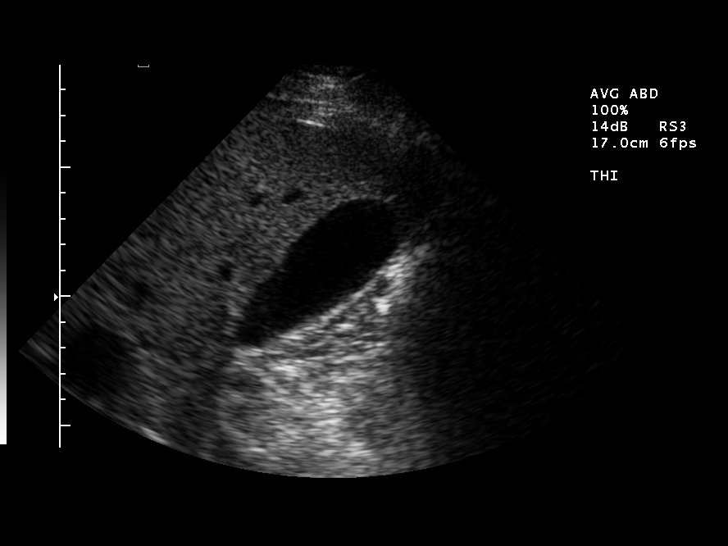
[im 38/82]
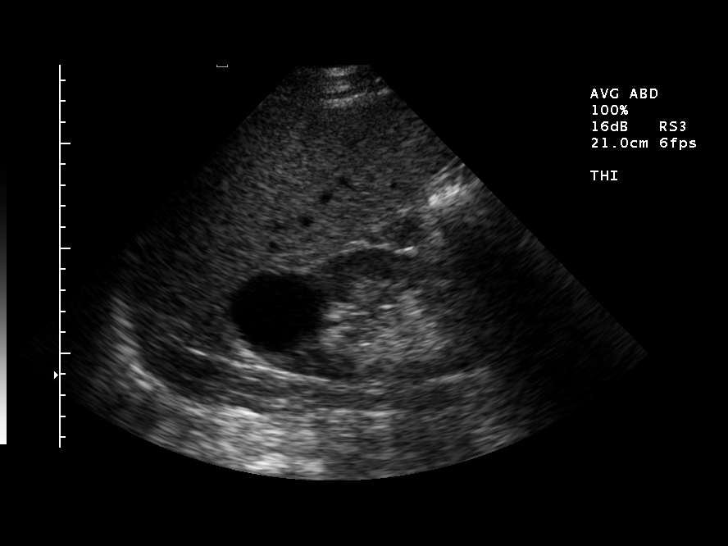
[im 44/82]
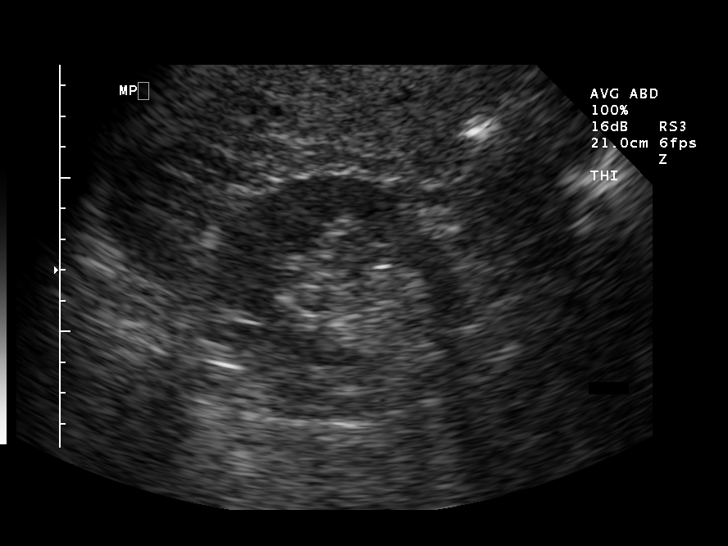
[im 51/82]
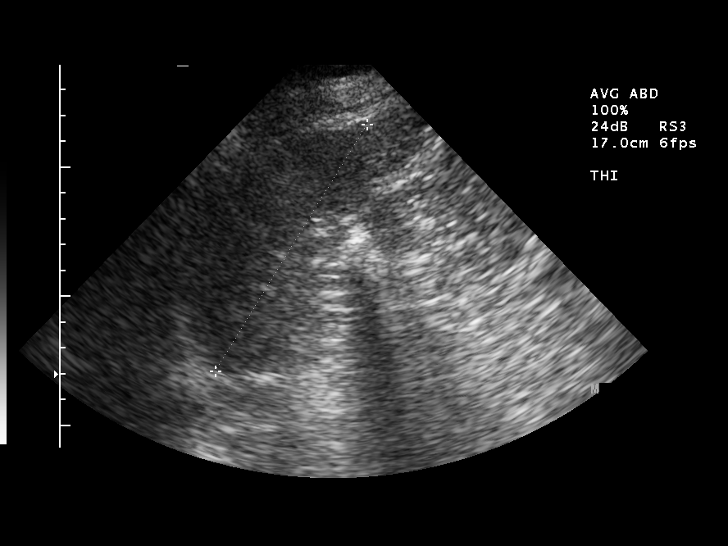
[im 55/82]
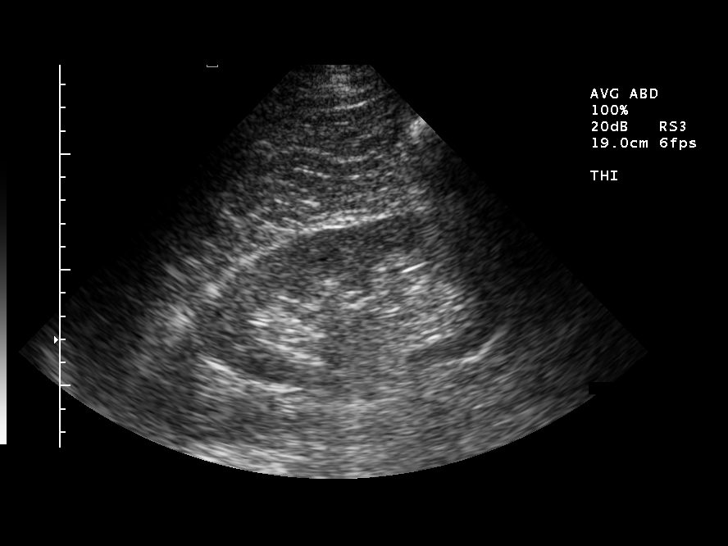
[im 61/82]
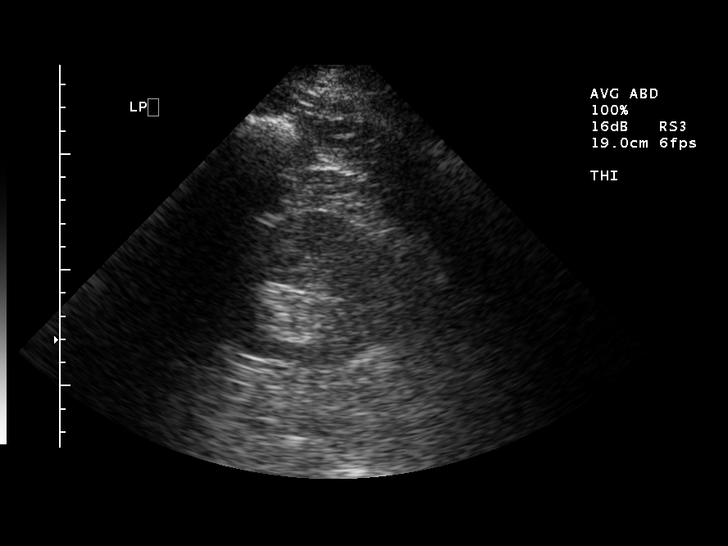
[im 68/82]
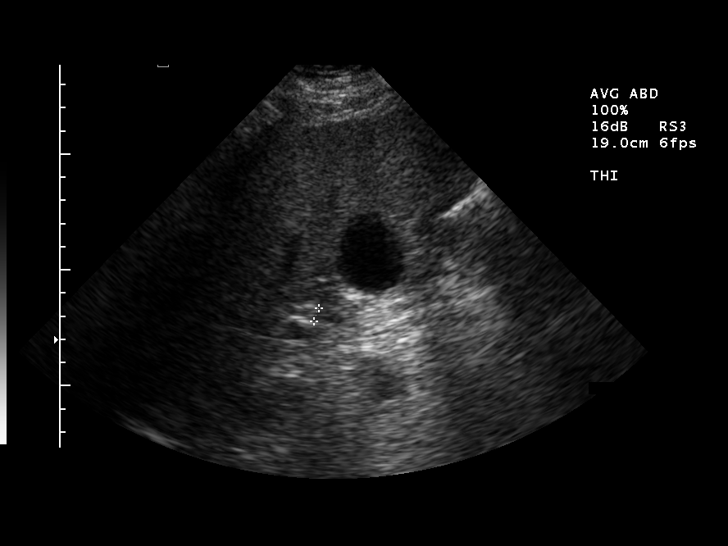
[im 75/82]
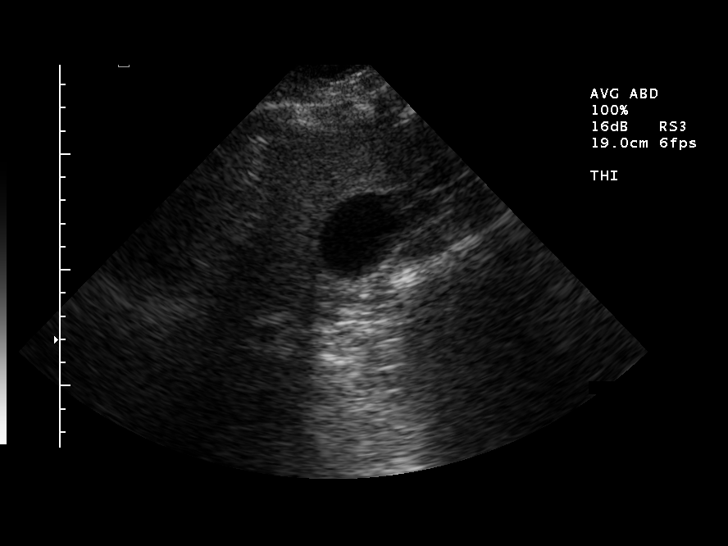
[im 82/82]
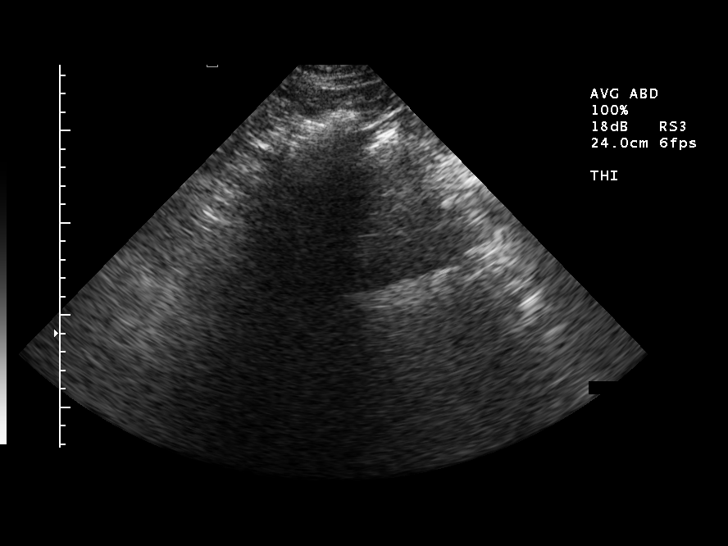

[14 of 25 positions shown; findings below may reference images not displayed]

IMPRESSION: 1.  Limited evaluation of the distal abdominal aorta and pancreas due to overlying bowel gas.
 2.  4.7cm in size simple-appearing upper pole right renal cyst.

## 2008-01-26 DIAGNOSIS — E119 Type 2 diabetes mellitus without complications: Secondary | ICD-10-CM

## 2008-01-26 DIAGNOSIS — E785 Hyperlipidemia, unspecified: Secondary | ICD-10-CM | POA: Insufficient documentation

## 2008-01-26 DIAGNOSIS — K219 Gastro-esophageal reflux disease without esophagitis: Secondary | ICD-10-CM | POA: Insufficient documentation

## 2008-01-26 DIAGNOSIS — K224 Dyskinesia of esophagus: Secondary | ICD-10-CM

## 2008-01-26 DIAGNOSIS — I10 Essential (primary) hypertension: Secondary | ICD-10-CM | POA: Insufficient documentation

## 2008-01-26 DIAGNOSIS — F411 Generalized anxiety disorder: Secondary | ICD-10-CM

## 2008-01-26 DIAGNOSIS — M3390 Dermatopolymyositis, unspecified, organ involvement unspecified: Secondary | ICD-10-CM

## 2008-01-26 DIAGNOSIS — J449 Chronic obstructive pulmonary disease, unspecified: Secondary | ICD-10-CM

## 2008-01-26 DIAGNOSIS — K648 Other hemorrhoids: Secondary | ICD-10-CM | POA: Insufficient documentation

## 2008-01-26 DIAGNOSIS — K5730 Diverticulosis of large intestine without perforation or abscess without bleeding: Secondary | ICD-10-CM | POA: Insufficient documentation

## 2008-01-29 ENCOUNTER — Ambulatory Visit: Payer: Self-pay | Admitting: Internal Medicine

## 2008-02-06 IMAGING — CT CT ANGIO CHEST
1 of 8 series · 11 of 46 positions shown · IV contrast (omnipaque)
Comparison: There are no prior studies available for comparison.

CLINICAL DATA: Chest pain. Numbness in both legs [REDACTED].  History of myocardial infarction and fibromyalgia.  Please evaluate. 
CT ANGIOGRAPHY OF CHEST:
TECHNIQUE: Multidetector CT imaging of the chest was performed during bolus injection of intravenous contrast.  Multiplanar CT angiographic image reconstructions were generated to evaluate the vascular anatomy.
Contrast:  150 cc Omnipaque 350.
TECHNIQUE: Multidetector CT imaging of the abdomen was performed before and during bolus injection of intravenous contrast.  Multiplanar CT angiographic image reconstructions were generated to evaluate the vascular anatomy.

[Series 9: cta_abd_xxl 0.75 b10f st · axial · 0.52mm/px · z∈[-478,-83]mm · 11 of 948 slices shown]
[im 79/948  lung]
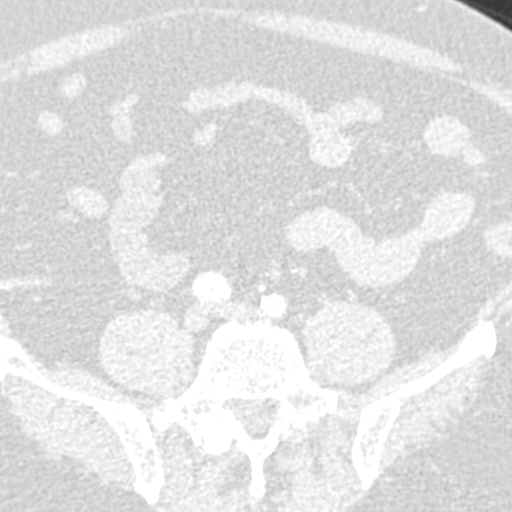
[im 158/948  soft-tissue]
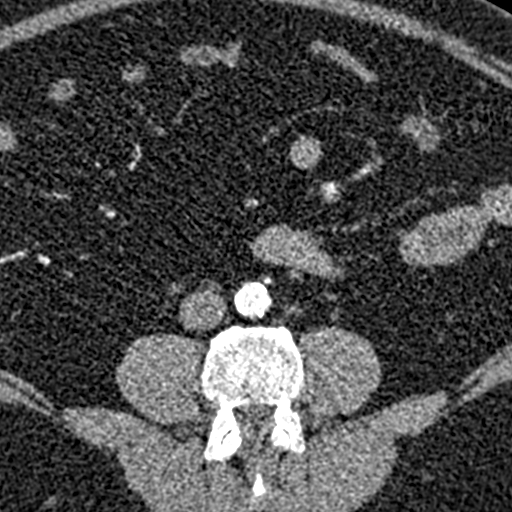
[im 237/948  lung]
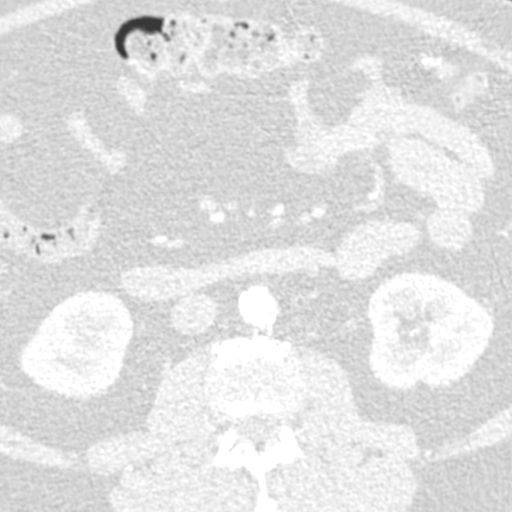
[im 316/948  soft-tissue]
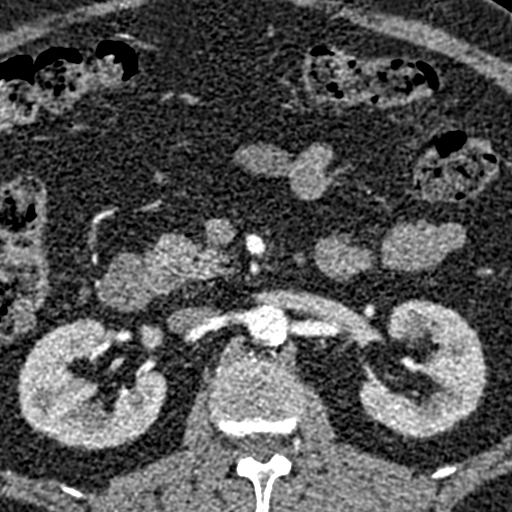
[im 395/948  lung]
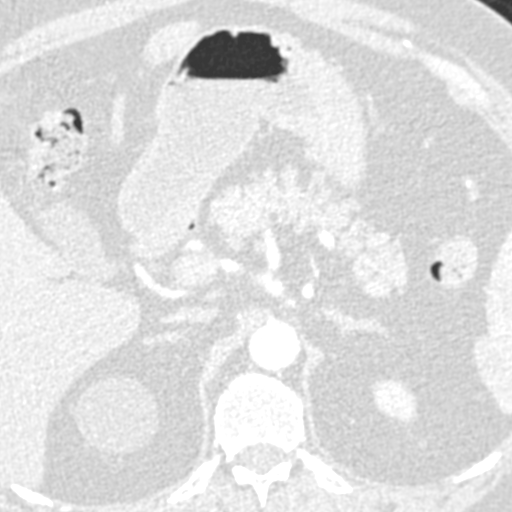
[im 474/948  soft-tissue]
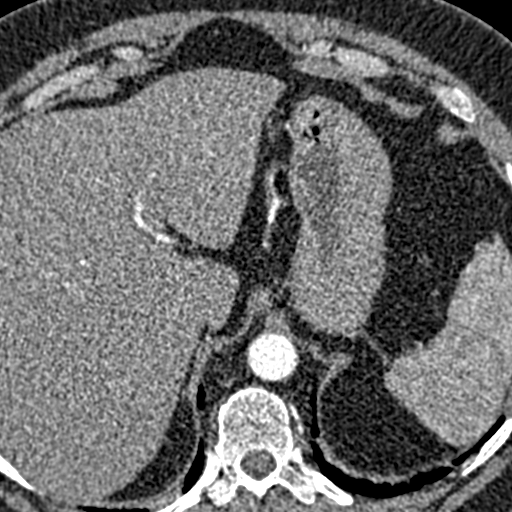
[im 553/948  lung]
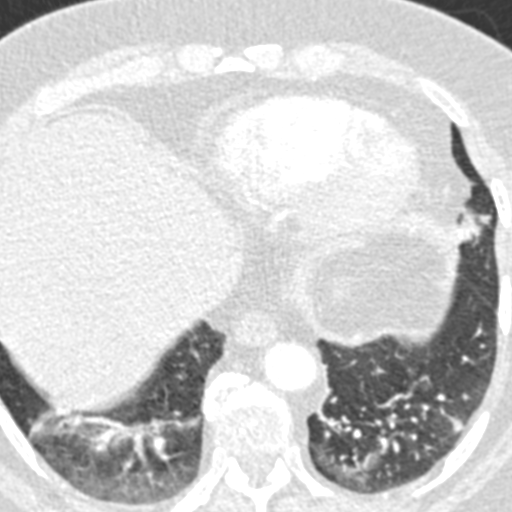
[im 632/948  soft-tissue]
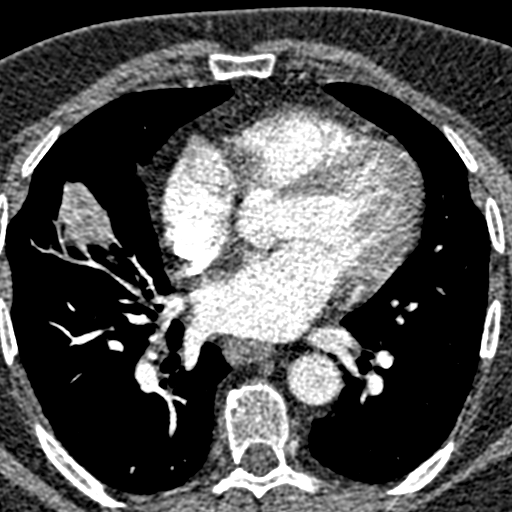
[im 711/948  lung]
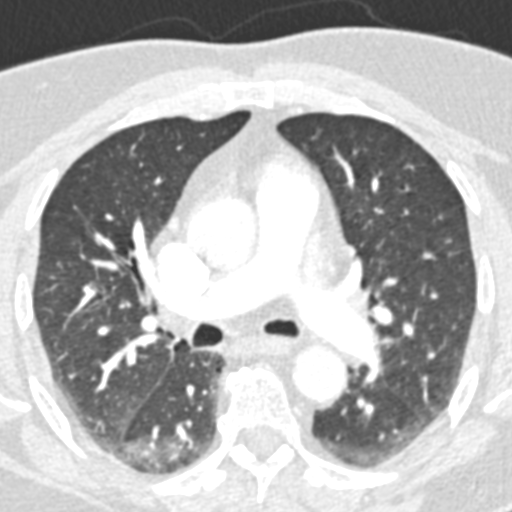
[im 790/948  soft-tissue]
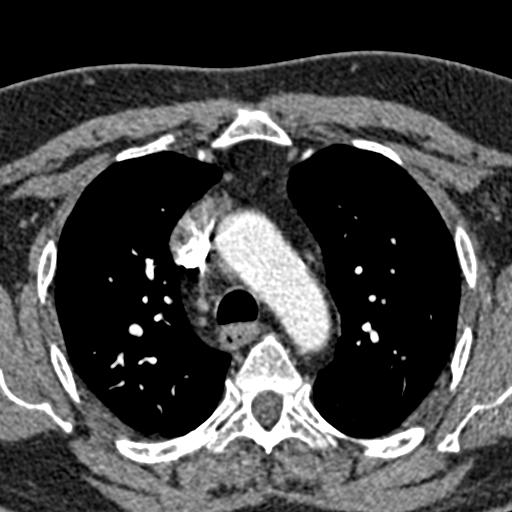
[im 869/948  lung]
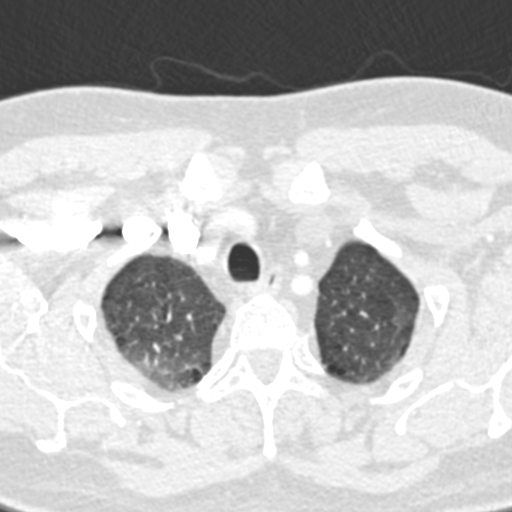

[11 of 46 positions shown; findings below may reference images not displayed]

FINDINGS: The precontrast demonstrate no evidence for intramural hematoma.  Following contrast enhancement, there is adequate enhancement of the vasculature.  There is no evidence for aortic dissection or an aortic aneurysm. There are scattered small mediastinal lymph nodes present, many which contain central fat, consistent with benign reactive lymph nodes.  There are mild bibasilar atelectatic changes.  There are no parenchymal pulmonary nodules.  There are mild changes of COPD noted.  The central airways are patent.  No CT scan evidence for pulmonary emboli.
IMPRESSION: 1.  No evidence for an aortic aneurysm or aortic dissection.  Also, no CT scan evidence for pulmonary emboli. 
2.  Mild bibasilar atelectatic changes. 
CT ANGIOGRAPHY OF ABDOMEN:
FINDINGS: There is a 4.4-cm sized simple-appearing upper pole right renal cyst.  The liver, spleen, pancreas, adrenal glands, and left kidney have a normal appearance.  The gallbladder has a normal appearance. The bowel  also has a normal appearance.  There is no evidence for abdominal aortic dissection or aneurysm.   There are mild atherosclerotic changes seen within the abdominal aorta.  The superior mesenteric, celiac, and inferior mesenteric arteries are patent.  There are single renal arteries bilaterally, which are patent.
IMPRESSION: 1.  No evidence for an aortic aneurysm or aortic dissection. 
2.  4.4-cm sized simple-appearing upper pole right renal cyst.
3.  Atherosclerotic changes seen within the abdominal aorta.

## 2008-02-14 ENCOUNTER — Telehealth: Payer: Self-pay | Admitting: Internal Medicine

## 2008-02-16 HISTORY — PX: OTHER SURGICAL HISTORY: SHX169

## 2008-02-19 ENCOUNTER — Ambulatory Visit: Payer: Self-pay | Admitting: Internal Medicine

## 2008-02-26 ENCOUNTER — Telehealth: Payer: Self-pay | Admitting: Internal Medicine

## 2008-02-28 ENCOUNTER — Ambulatory Visit: Payer: Self-pay | Admitting: Internal Medicine

## 2008-02-28 ENCOUNTER — Inpatient Hospital Stay (HOSPITAL_COMMUNITY): Admission: AD | Admit: 2008-02-28 | Discharge: 2008-02-29 | Payer: Self-pay | Admitting: Internal Medicine

## 2008-02-28 ENCOUNTER — Telehealth: Payer: Self-pay | Admitting: Internal Medicine

## 2008-02-29 ENCOUNTER — Encounter: Payer: Self-pay | Admitting: Internal Medicine

## 2008-03-04 ENCOUNTER — Encounter: Payer: Self-pay | Admitting: Internal Medicine

## 2008-03-04 ENCOUNTER — Telehealth: Payer: Self-pay | Admitting: Internal Medicine

## 2008-04-18 ENCOUNTER — Ambulatory Visit: Payer: Self-pay | Admitting: Family Medicine

## 2008-05-31 ENCOUNTER — Ambulatory Visit: Payer: Self-pay | Admitting: Family Medicine

## 2008-05-31 ENCOUNTER — Encounter: Payer: Self-pay | Admitting: Sports Medicine

## 2008-05-31 DIAGNOSIS — I259 Chronic ischemic heart disease, unspecified: Secondary | ICD-10-CM

## 2008-05-31 DIAGNOSIS — R42 Dizziness and giddiness: Secondary | ICD-10-CM

## 2008-05-31 DIAGNOSIS — R259 Unspecified abnormal involuntary movements: Secondary | ICD-10-CM | POA: Insufficient documentation

## 2008-05-31 DIAGNOSIS — I7300 Raynaud's syndrome without gangrene: Secondary | ICD-10-CM

## 2008-05-31 DIAGNOSIS — K449 Diaphragmatic hernia without obstruction or gangrene: Secondary | ICD-10-CM | POA: Insufficient documentation

## 2008-05-31 DIAGNOSIS — R52 Pain, unspecified: Secondary | ICD-10-CM

## 2008-05-31 DIAGNOSIS — IMO0001 Reserved for inherently not codable concepts without codable children: Secondary | ICD-10-CM

## 2008-05-31 DIAGNOSIS — M069 Rheumatoid arthritis, unspecified: Secondary | ICD-10-CM | POA: Insufficient documentation

## 2008-05-31 DIAGNOSIS — F329 Major depressive disorder, single episode, unspecified: Secondary | ICD-10-CM | POA: Insufficient documentation

## 2008-05-31 LAB — CONVERTED CEMR LAB
ALT: 25 U/L (ref 0–53)
AST: 29 U/L (ref 0–37)
Albumin: 3.8 g/dL (ref 3.5–5.2)
Alkaline Phosphatase: 91 U/L (ref 39–117)
BUN: 8 mg/dL (ref 6–23)
CO2: 28 meq/L (ref 19–32)
Calcium: 9.6 mg/dL (ref 8.4–10.5)
Chloride: 102 meq/L (ref 96–112)
Cholesterol: 197 mg/dL (ref 0–200)
Creatinine, Ser: 0.74 mg/dL (ref 0.40–1.50)
Glucose, Bld: 70 mg/dL (ref 70–99)
HCT: 50.1 % (ref 39.0–52.0)
HDL: 39 mg/dL — ABNORMAL LOW (ref >39)
Hemoglobin: 16.4 g/dL (ref 13.0–17.0)
Hgb A1c MFr Bld: 5.6 %
LDL Cholesterol: 110 mg/dL — ABNORMAL HIGH (ref 0–99)
MCHC: 32.7 g/dL (ref 30.0–36.0)
MCV: 99.2 fL (ref 78.0–100.0)
PSA: 0.76 ng/mL (ref 0.10–4.00)
Platelets: 321 K/uL (ref 150–400)
Potassium: 3.6 meq/L (ref 3.5–5.3)
RBC: 5.05 M/uL (ref 4.22–5.81)
RDW: 18.4 % — ABNORMAL HIGH (ref 11.5–15.5)
Sodium: 144 meq/L (ref 135–145)
Total Bilirubin: 0.7 mg/dL (ref 0.3–1.2)
Total CHOL/HDL Ratio: 5.1
Total Protein: 7.1 g/dL (ref 6.0–8.3)
Triglycerides: 242 mg/dL — ABNORMAL HIGH (ref <150)
VLDL: 48 mg/dL — ABNORMAL HIGH (ref 0–40)
WBC: 7.5 10*3/microliter (ref 4.0–10.5)

## 2008-06-18 ENCOUNTER — Ambulatory Visit: Payer: Self-pay | Admitting: Family Medicine

## 2008-06-25 IMAGING — CR DG CHEST 2V
2 series · 2 of 2 positions shown · non-contrast
Comparison: 10/21/03.

CLINICAL DATA: Pre-op for left base tongue lesion.   Smoker.  Hypertension. 
 CHEST ? 2 VIEW:

[view not recorded (1 of 2)]
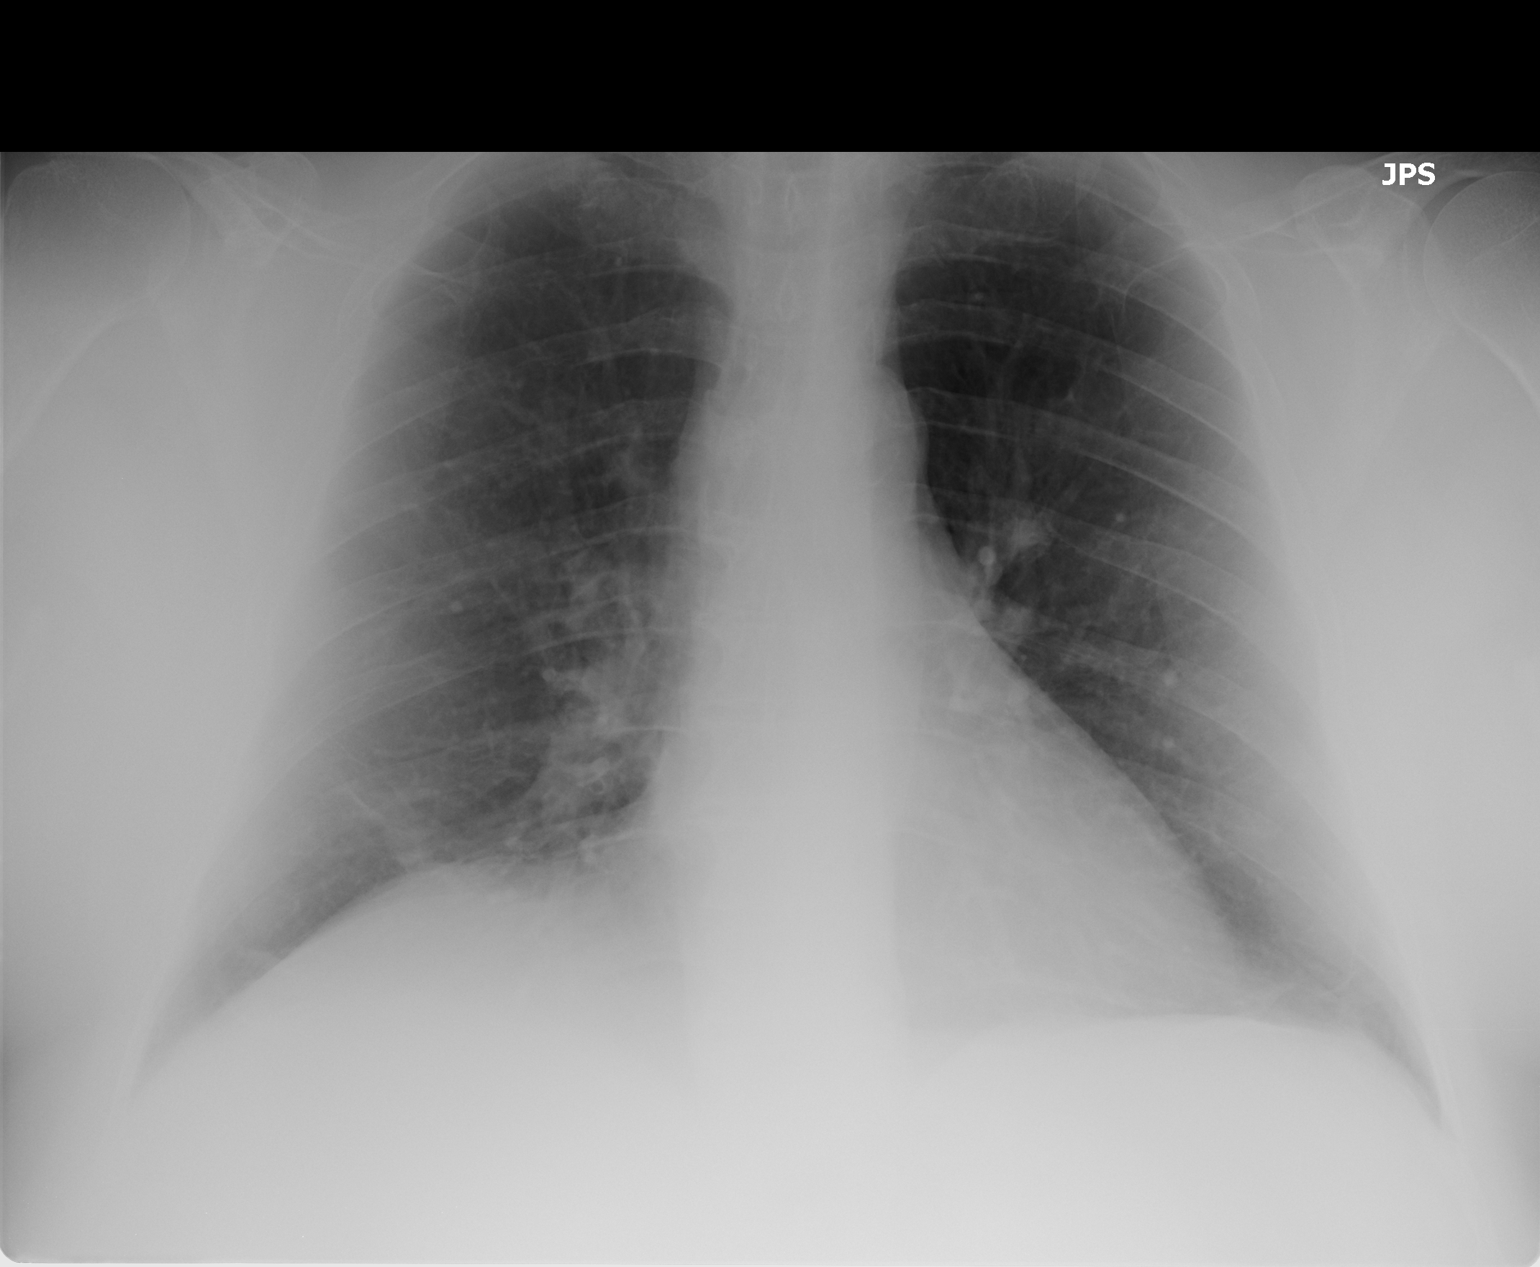

[view not recorded (2 of 2)]
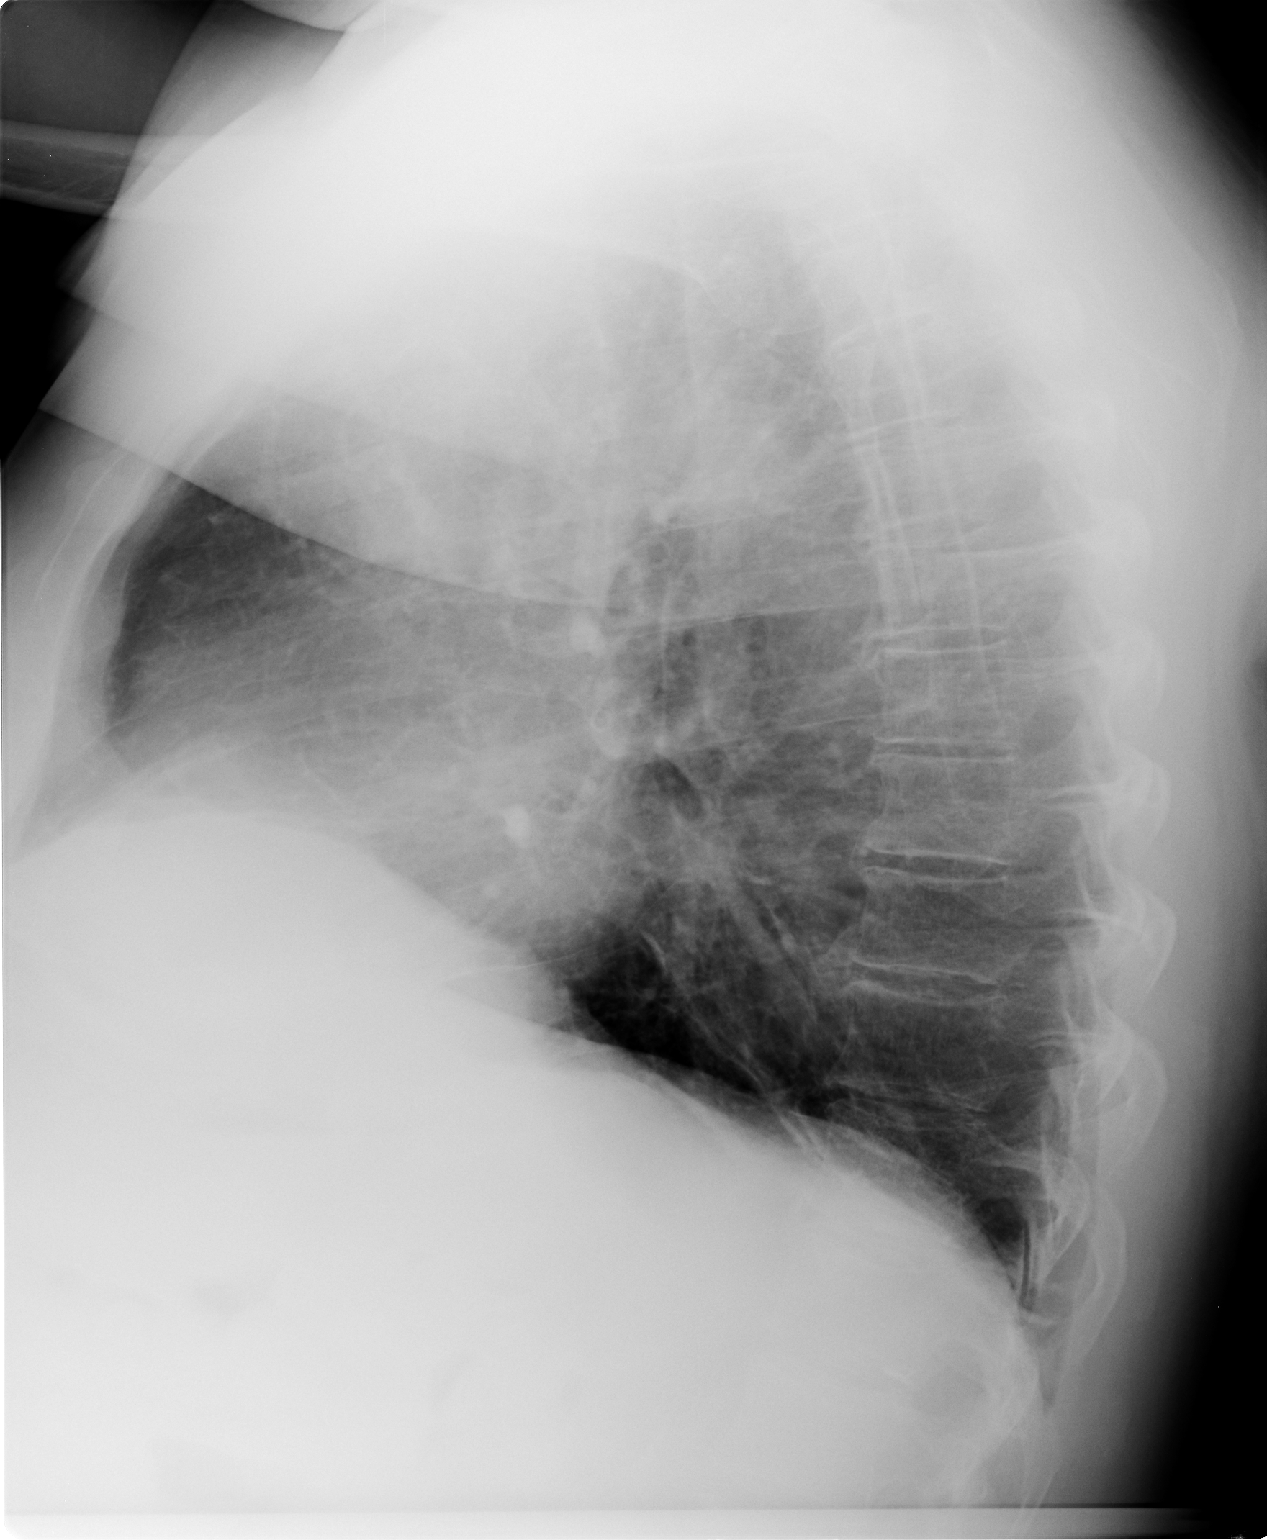

[2 of 2 positions shown; findings below may reference images not displayed]

FINDINGS: Subtle linear densities at the lung bases compatible with scarring.  Nodular-like density at the right base as well.  Cardiac size within normal limits.  Lungs hyperaerated and minimal accentuation of peribronchial/interstitial markings.  Findings suggest COPD.  Bony thorax intact.
IMPRESSION: There is an approximate 1.1 x 1.5 nodular-like density at the right base.  Consider CT for further evaluation.  COPD.

## 2008-06-27 ENCOUNTER — Telehealth: Payer: Self-pay | Admitting: *Deleted

## 2008-07-08 ENCOUNTER — Telehealth: Payer: Self-pay | Admitting: Cardiology

## 2008-07-25 ENCOUNTER — Ambulatory Visit: Payer: Self-pay | Admitting: Family Medicine

## 2008-07-25 DIAGNOSIS — E669 Obesity, unspecified: Secondary | ICD-10-CM | POA: Insufficient documentation

## 2008-08-16 ENCOUNTER — Ambulatory Visit: Payer: Self-pay | Admitting: Family Medicine

## 2008-08-16 ENCOUNTER — Encounter: Payer: Self-pay | Admitting: Family Medicine

## 2008-08-16 LAB — CONVERTED CEMR LAB: Hgb A1c MFr Bld: 5.7 %

## 2008-08-20 ENCOUNTER — Encounter: Payer: Self-pay | Admitting: Sports Medicine

## 2008-08-23 ENCOUNTER — Telehealth: Payer: Self-pay | Admitting: Sports Medicine

## 2008-08-26 ENCOUNTER — Telehealth: Payer: Self-pay | Admitting: Sports Medicine

## 2008-08-27 ENCOUNTER — Ambulatory Visit: Payer: Self-pay | Admitting: Family Medicine

## 2008-09-09 ENCOUNTER — Telehealth: Payer: Self-pay | Admitting: Sports Medicine

## 2008-09-18 ENCOUNTER — Ambulatory Visit: Payer: Self-pay | Admitting: Family Medicine

## 2008-10-09 IMAGING — CT CT CHEST W/ CM
3 series · 17 of 29 positions shown, 19 images · IV contrast (omnipaque)
Comparison: 12/29/05 chest x-ray and 08/11/05 CT angio chest.

CLINICAL DATA: Right lower lobe nodule by plain radiography.
CHEST CT WITH CONTRAST ? 04/14/06:
TECHNIQUE: Multidetector CT imaging of the chest was performed following the standard protocol during bolus administration of intravenous contrast.   
Contrast:  100 cc Omnipaque 300 IV.

[Series 2: routine chest · axial · 0.84mm/px · z∈[-339,-94]mm · 7 of 69 slices shown, 9 images]
[im 10/69  mediastinal]
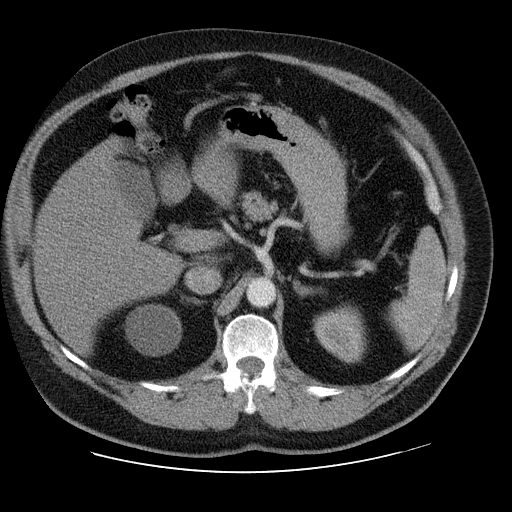
[im 10/69  lung]
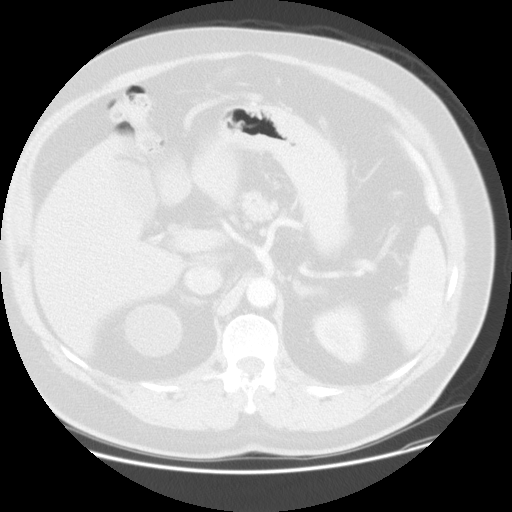
[im 20/69  lung]
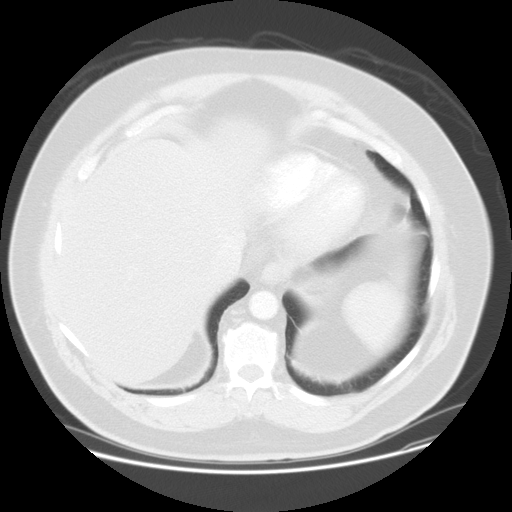
[im 30/69  lung]
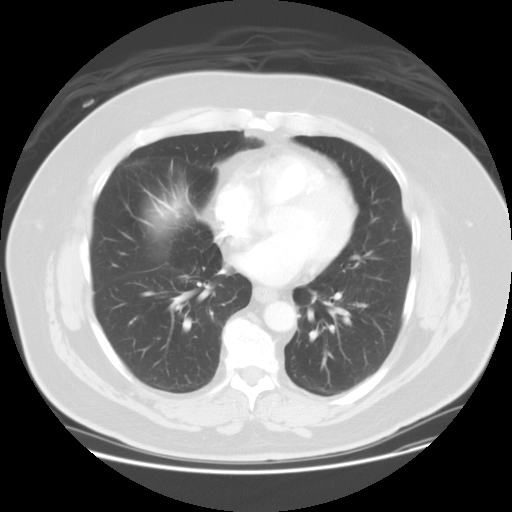
[im 38/69  lung]
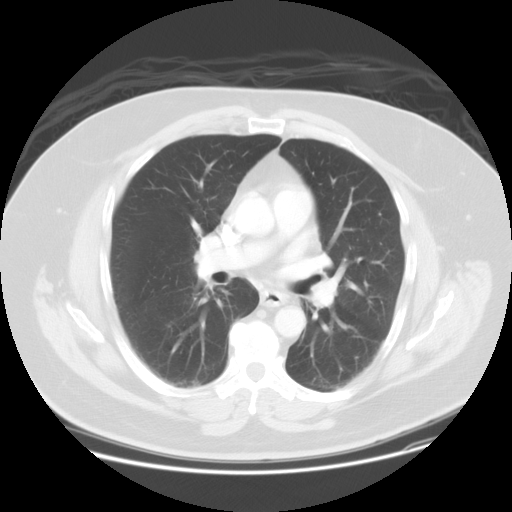
[im 39/69  mediastinal]
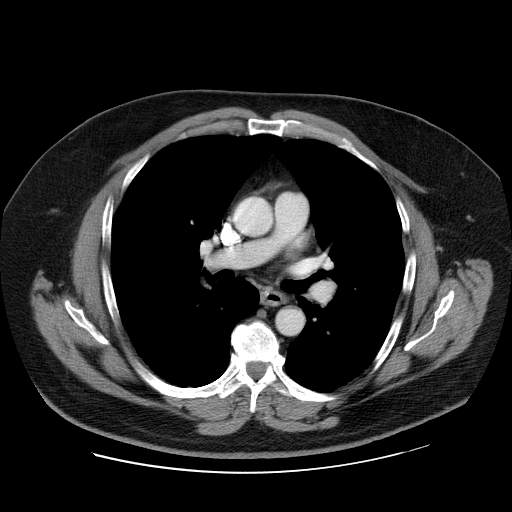
[im 39/69  lung]
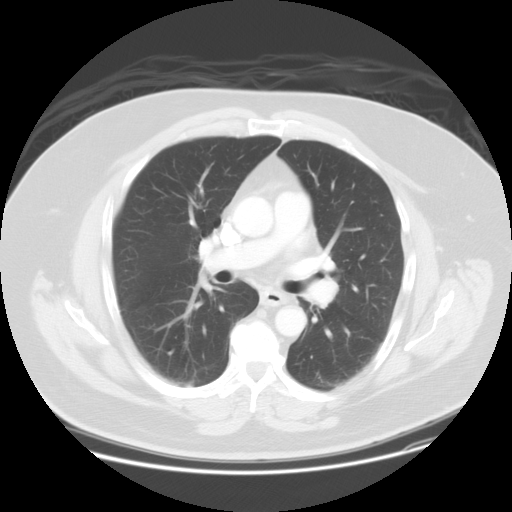
[im 49/69  lung]
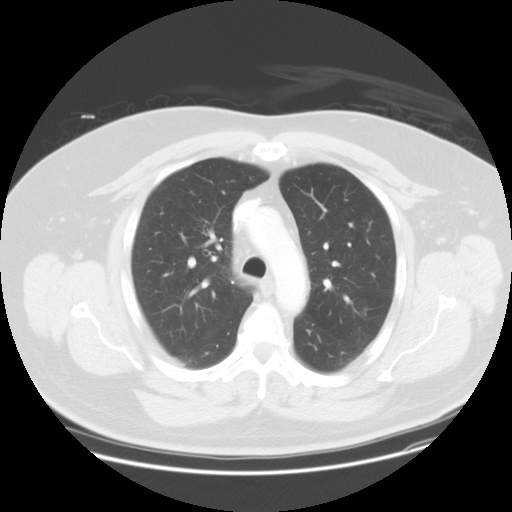
[im 59/69  lung]
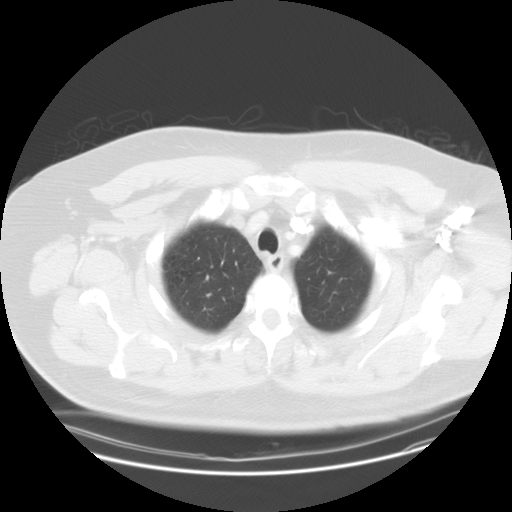

[Series 400: sagittal chest · sagittal · 0.82mm/px · 8 of 93 slices shown]
[im 11/93  lung]
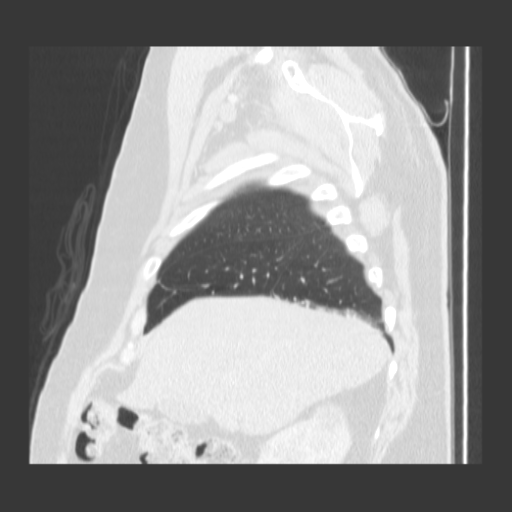
[im 21/93  lung]
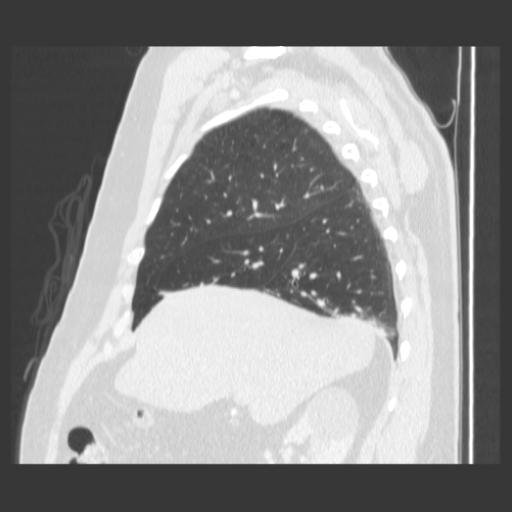
[im 31/93  lung]
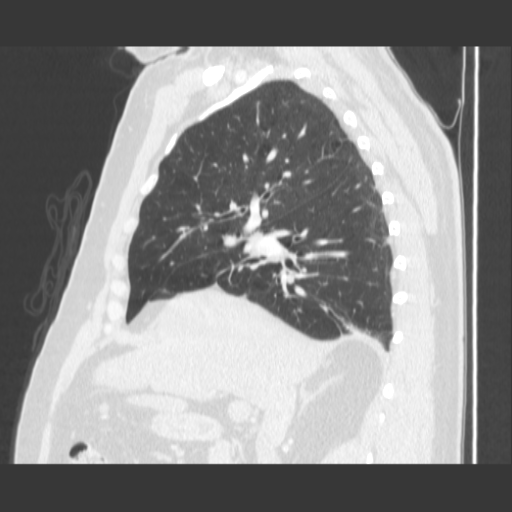
[im 41/93  lung]
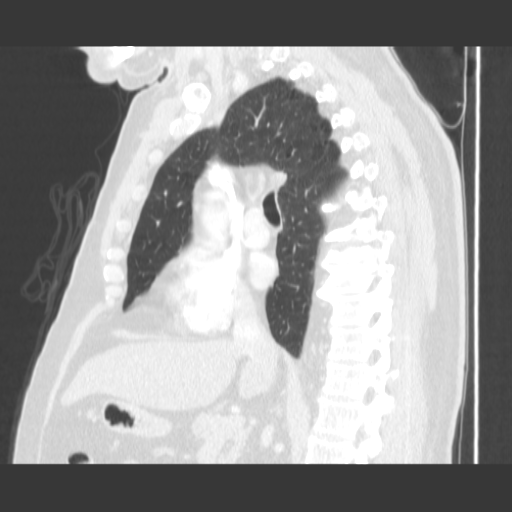
[im 52/93  lung]
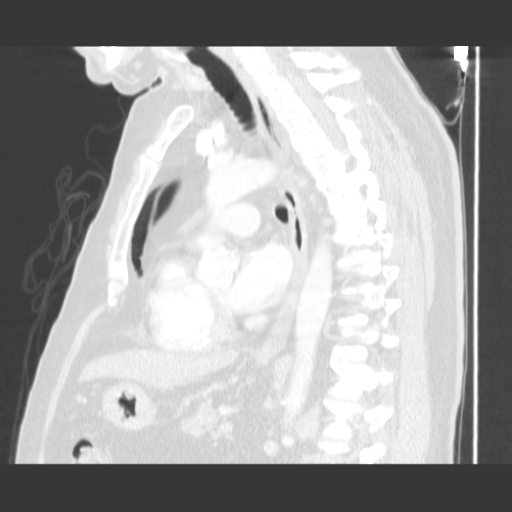
[im 62/93  lung]
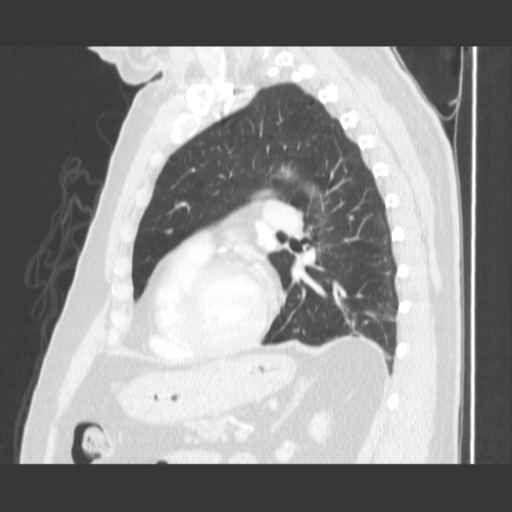
[im 72/93  lung]
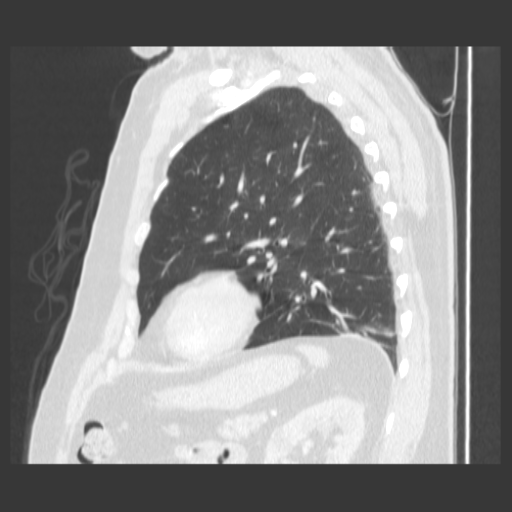
[im 82/93  lung]
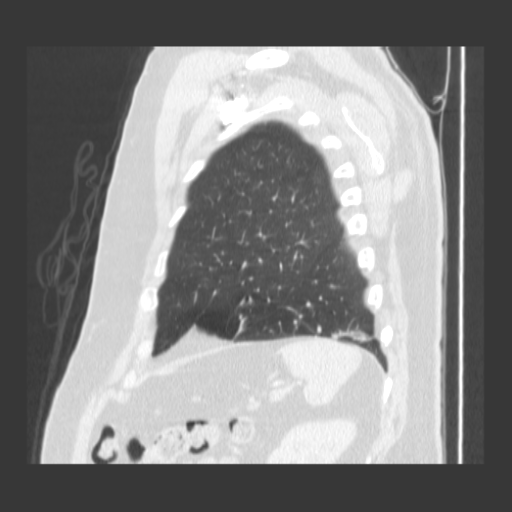

[Series 401: coronal chest · coronal · 0.82mm/px · 2 of 93 slices shown]
[im 11/93  lung]
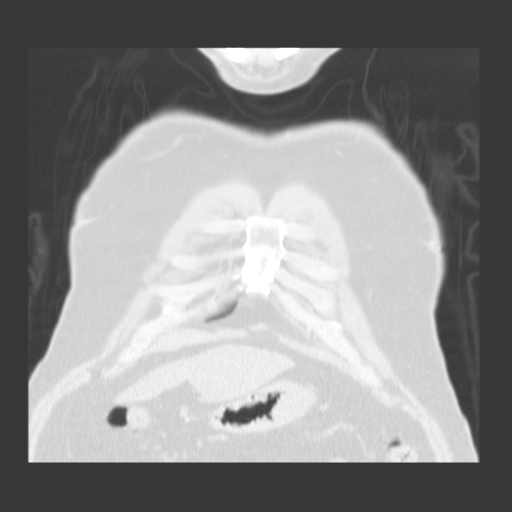
[im 21/93  lung]
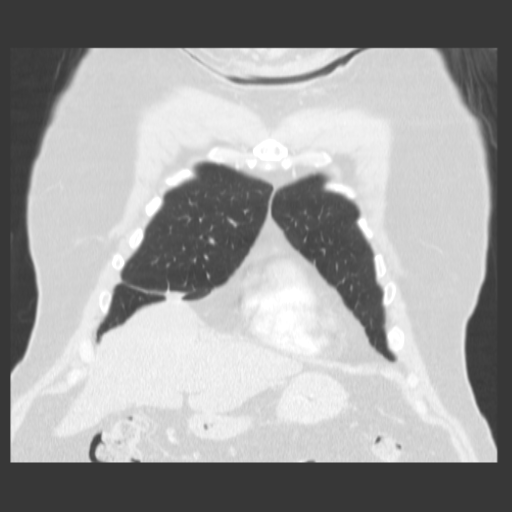

[17 of 29 positions shown; findings below may reference images not displayed]

FINDINGS: No axillary, hilar, or mediastinal adenopathy.  Within the mediastinum, there are prominent paratracheal lymph nodes which have fatty hila.  No pleural or pericardial effusion.  Normal heart size.  Degenerative changes are present in the thoracic spine.
Lung windows demonstrate mild changes of upper lobe centrilobular emphysema.  No acute pneumonia, consolidation, interstitial change, or bronchial abnormality.  Bibasilar linear and nodular scarring is stable.  In the anterior right lower lobe, there is nodular subpleural scarring versus rounded atelectasis overlying the diaphragm which is stable compared to 08/11/05 and appears to account for the chest x-ray abnormality.
No suspicious pulmonary nodule, enlarging nodule, or mass.
IMPRESSION: 1.  Bibasilar linear and nodular scarring as well as anterior right lower lobe subpleural rounded atelectasis accounting for the chest x-ray finding.
2.  No suspicious pulmonary nodule, mass, or adenopathy.

## 2008-10-29 ENCOUNTER — Ambulatory Visit: Payer: Self-pay | Admitting: Family Medicine

## 2008-10-29 LAB — CONVERTED CEMR LAB: Rapid Strep: NEGATIVE

## 2008-11-05 ENCOUNTER — Ambulatory Visit (HOSPITAL_COMMUNITY): Admission: RE | Admit: 2008-11-05 | Discharge: 2008-11-05 | Payer: Self-pay | Admitting: Sports Medicine

## 2008-11-14 ENCOUNTER — Encounter: Payer: Self-pay | Admitting: Sports Medicine

## 2008-11-23 ENCOUNTER — Encounter (INDEPENDENT_AMBULATORY_CARE_PROVIDER_SITE_OTHER): Payer: Self-pay | Admitting: *Deleted

## 2008-11-23 DIAGNOSIS — Z720 Tobacco use: Secondary | ICD-10-CM | POA: Insufficient documentation

## 2008-11-25 ENCOUNTER — Ambulatory Visit: Payer: Self-pay | Admitting: Family Medicine

## 2008-11-25 ENCOUNTER — Encounter: Payer: Self-pay | Admitting: Sports Medicine

## 2008-11-26 LAB — CONVERTED CEMR LAB
Basophils Absolute: 0 10*3/uL (ref 0.0–0.1)
Basophils Relative: 0 % (ref 0–1)
Eosinophils Absolute: 0.2 10*3/uL (ref 0.0–0.7)
Eosinophils Relative: 2 % (ref 0–5)
Folate: 5.8 ng/mL
HCT: 47.1 % (ref 39.0–52.0)
Hemoglobin: 15.4 g/dL (ref 13.0–17.0)
Lymphocytes Relative: 17 % (ref 12–46)
Lymphs Abs: 1.3 K/uL (ref 0.7–4.0)
MCHC: 32.7 g/dL (ref 30.0–36.0)
MCV: 107.5 fL — ABNORMAL HIGH (ref 78.0–100.0)
Monocytes Absolute: 0.8 10*3/uL (ref 0.1–1.0)
Monocytes Relative: 10 % (ref 3–12)
Neutro Abs: 5.7 K/uL (ref 1.7–7.7)
Neutrophils Relative %: 71 % (ref 43–77)
Platelets: 311 10*3/uL (ref 150–400)
RBC: 4.38 M/uL (ref 4.22–5.81)
RDW: 17.8 % — ABNORMAL HIGH (ref 11.5–15.5)
Vitamin B-12: 295 pg/mL (ref 211–911)
WBC: 8 10*3/microliter (ref 4.0–10.5)

## 2008-12-03 ENCOUNTER — Ambulatory Visit: Payer: Self-pay | Admitting: Family Medicine

## 2008-12-16 ENCOUNTER — Encounter: Admission: RE | Admit: 2008-12-16 | Discharge: 2009-02-13 | Payer: Self-pay | Admitting: Sports Medicine

## 2008-12-26 ENCOUNTER — Ambulatory Visit: Payer: Self-pay | Admitting: Family Medicine

## 2009-01-20 ENCOUNTER — Encounter: Payer: Self-pay | Admitting: Sports Medicine

## 2009-01-27 ENCOUNTER — Telehealth: Payer: Self-pay | Admitting: *Deleted

## 2009-02-12 ENCOUNTER — Ambulatory Visit: Payer: Self-pay | Admitting: Family Medicine

## 2009-02-12 ENCOUNTER — Encounter: Payer: Self-pay | Admitting: Sports Medicine

## 2009-03-03 ENCOUNTER — Ambulatory Visit: Payer: Self-pay | Admitting: Family Medicine

## 2009-03-12 ENCOUNTER — Telehealth (INDEPENDENT_AMBULATORY_CARE_PROVIDER_SITE_OTHER): Payer: Self-pay | Admitting: *Deleted

## 2009-03-20 ENCOUNTER — Ambulatory Visit: Payer: Self-pay | Admitting: Family Medicine

## 2009-04-07 ENCOUNTER — Encounter: Payer: Self-pay | Admitting: Sports Medicine

## 2009-04-08 ENCOUNTER — Ambulatory Visit: Payer: Self-pay | Admitting: Family Medicine

## 2009-04-09 ENCOUNTER — Telehealth (INDEPENDENT_AMBULATORY_CARE_PROVIDER_SITE_OTHER): Payer: Self-pay | Admitting: *Deleted

## 2009-04-11 ENCOUNTER — Ambulatory Visit: Payer: Self-pay | Admitting: Family Medicine

## 2009-04-11 ENCOUNTER — Encounter: Payer: Self-pay | Admitting: Sports Medicine

## 2009-04-21 ENCOUNTER — Telehealth: Payer: Self-pay | Admitting: Sports Medicine

## 2009-04-21 IMAGING — MR MR HEAD WO/W CM
9 of 13 series · 32 of 48 positions shown · IV contrast (magnevist)
Comparison: none

CLINICAL DATA: Visual blurring, headache.
MRI BRAIN WITHOUT AND WITH CONTRAST:
TECHNIQUE: Multiplanar and multiecho pulse sequences of the brain and surrounding structures were obtained according to standard protocol before and after administration of intravenous contrast.
Contrast:  20 cc Magnevist.

[Series 2: DWI · axial · 5.0mm · 0.94mm/px · z∈[-21,+120]mm · 7 of 53 slices shown (1 of 4)]
[im 1/53]
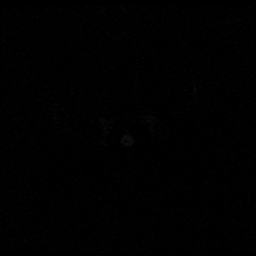
[im 9/53]
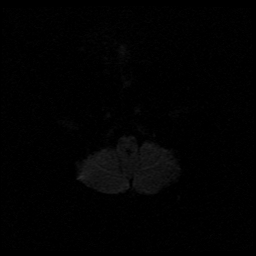
[im 18/53]
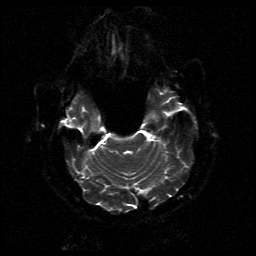
[im 27/53]
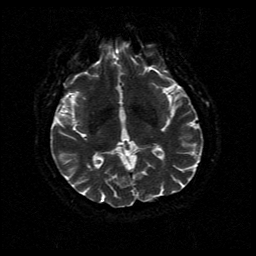
[im 35/53]
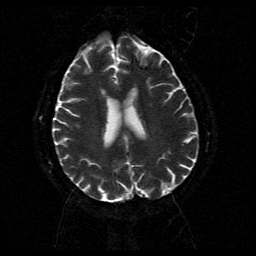
[im 44/53]
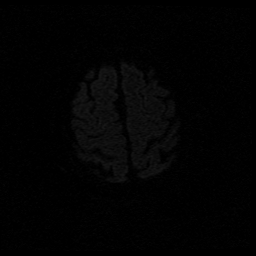
[im 53/53]
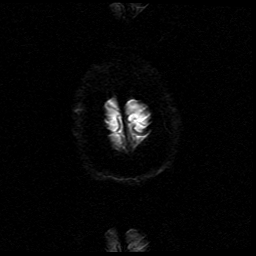

[Series 3: T1 · sagittal · 5.0mm · 0.47mm/px · 1 of 12 slices shown]
[im 1/12]
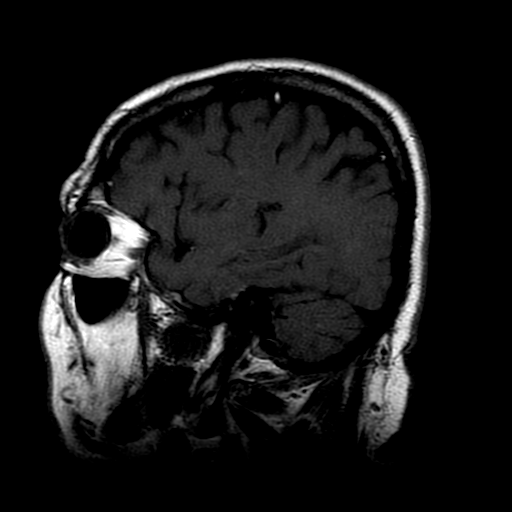

[Series 4: DWI · axial · 5.0mm · 1.41mm/px · z∈[-2,+134]mm · 6 of 52 slices shown (2 of 4)]
[im 1/52]
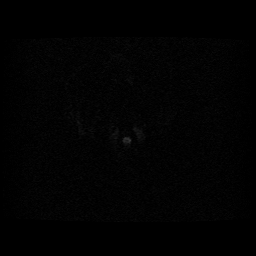
[im 11/52]
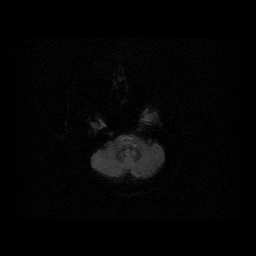
[im 21/52]
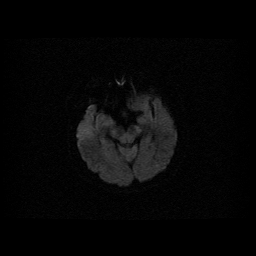
[im 31/52]
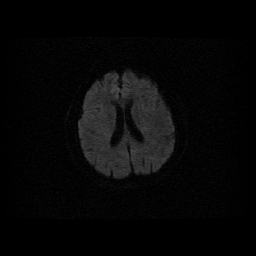
[im 41/52]
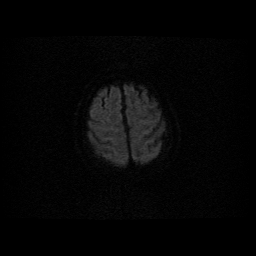
[im 52/52]
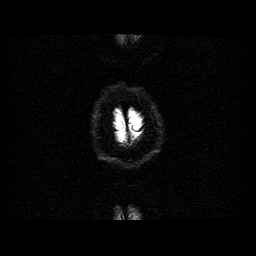

[Series 5: T2 · axial · 5.0mm · 0.47mm/px · z∈[-13,+123]mm · 3 of 24 slices shown (1 of 2)]
[im 1/24]
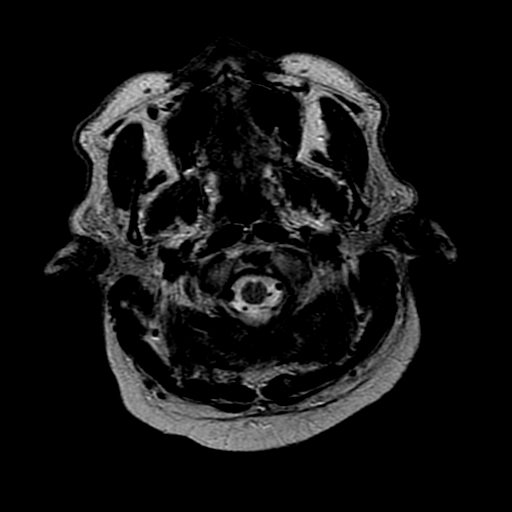
[im 12/24]
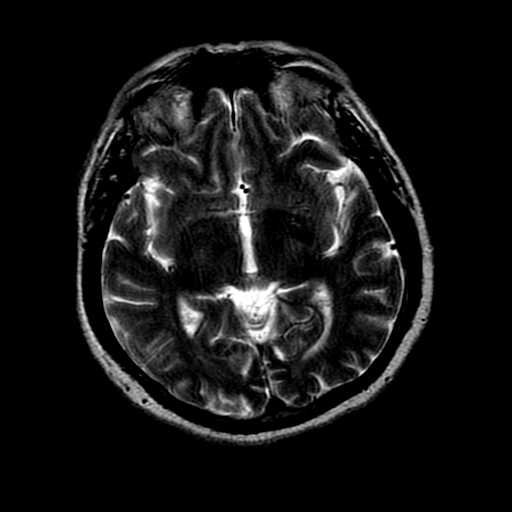
[im 24/24]
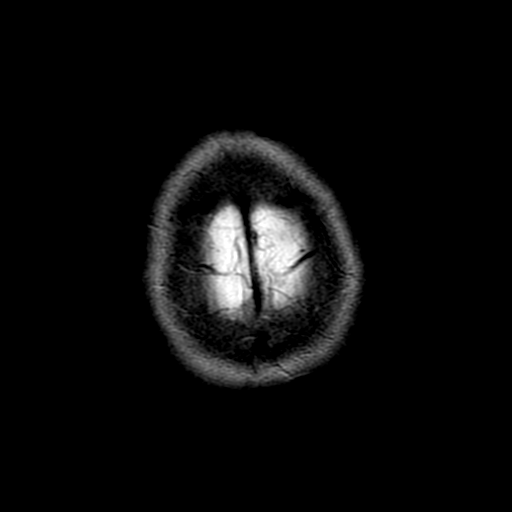

[Series 6: FLAIR · axial · 5.0mm · 0.45mm/px · z∈[-14,+122]mm · 3 of 24 slices shown]
[im 1/24]
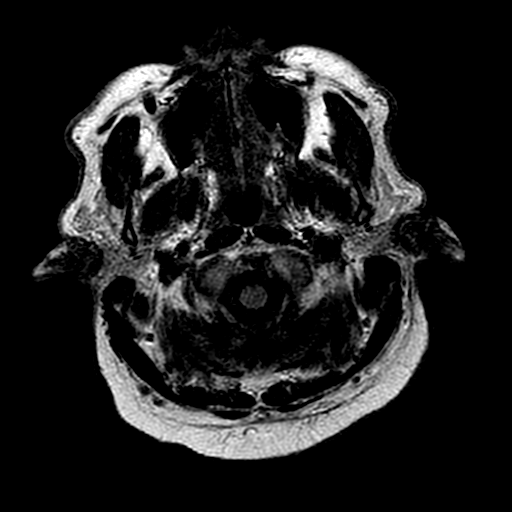
[im 12/24]
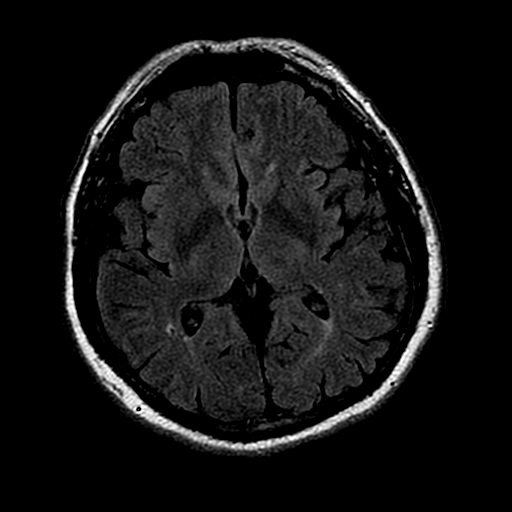
[im 24/24]
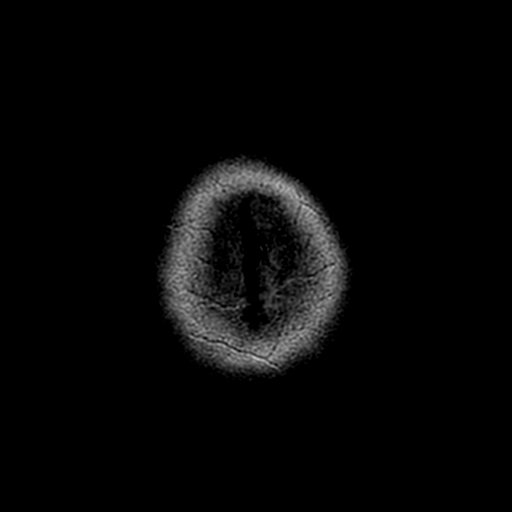

[Series 9: T2 · coronal · 5.0mm · 0.51mm/px · 3 of 24 slices shown (2 of 2)]
[im 1/24]
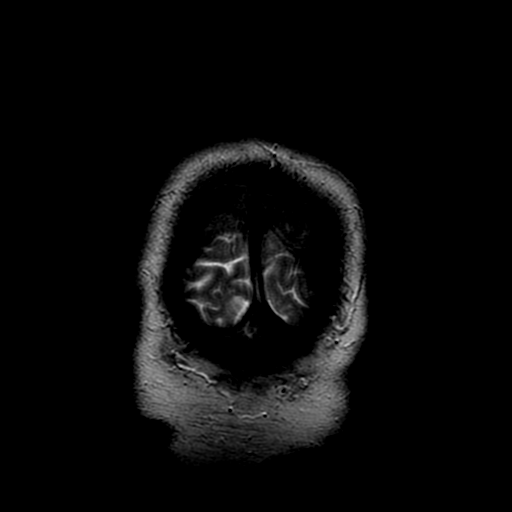
[im 12/24]
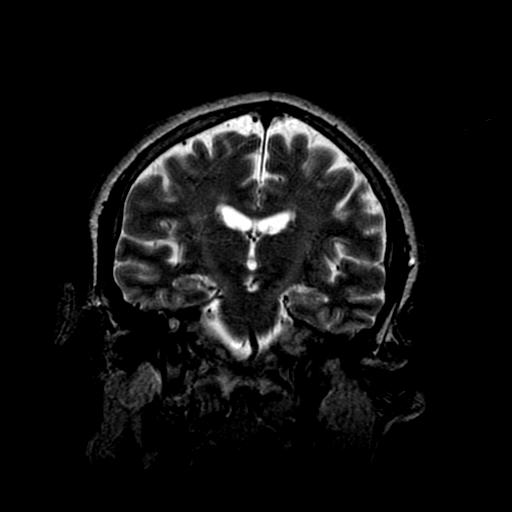
[im 24/24]
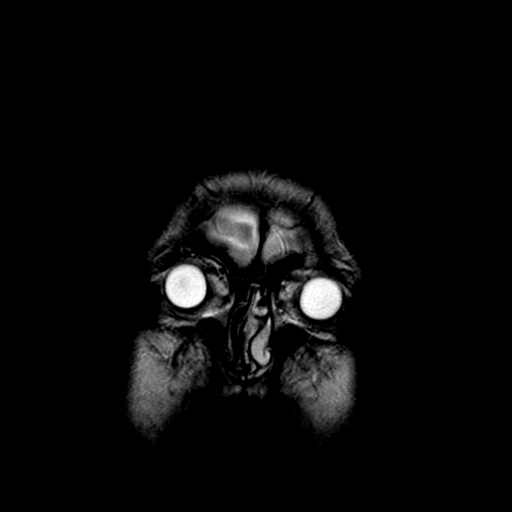

[Series 12: T1 post-contrast · coronal · 5.0mm · 0.43mm/px · 3 of 24 slices shown]
[im 1/24]
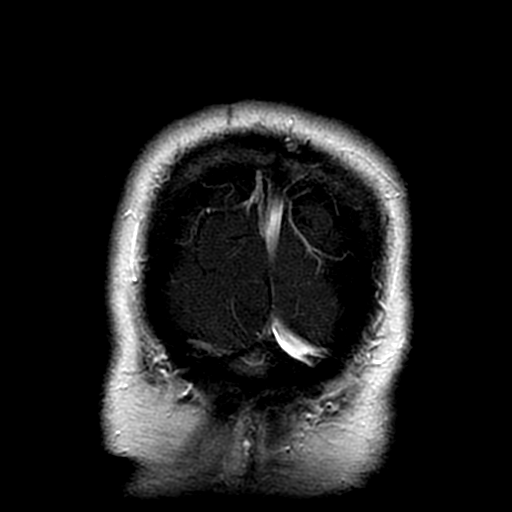
[im 12/24]
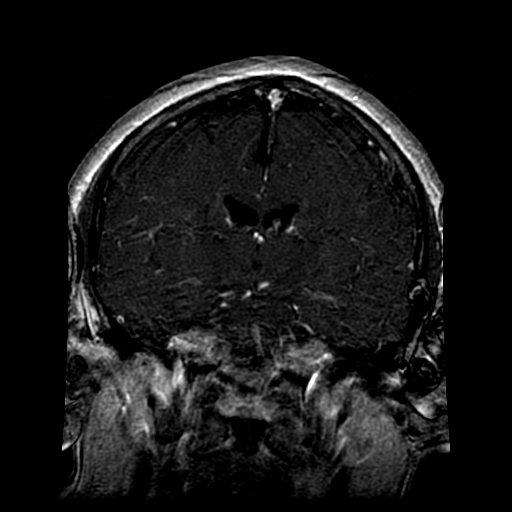
[im 24/24]
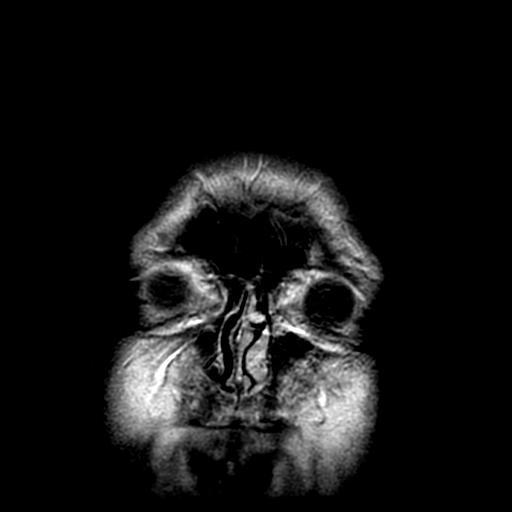

[Series 158: DWI · axial · 5.0mm · 0.94mm/px · z∈[-21,+120]mm · 3 of 27 slices shown (3 of 4)]
[im 1/27]
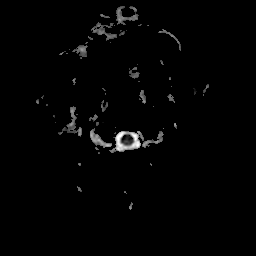
[im 14/27]
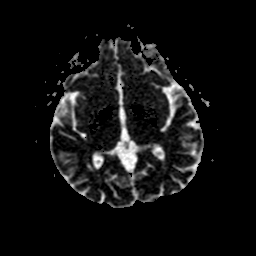
[im 27/27]
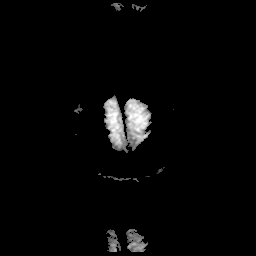

[Series 159: DWI · axial · 5.0mm · 1.41mm/px · z∈[-2,+134]mm · 3 of 26 slices shown (4 of 4)]
[im 1/26]
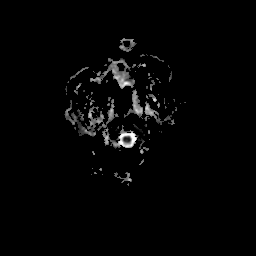
[im 13/26]
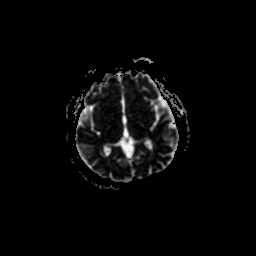
[im 26/26]
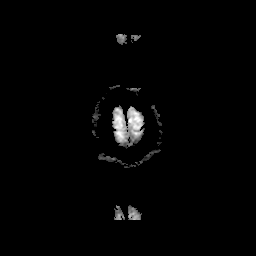

[32 of 48 positions shown; findings below may reference images not displayed]

FINDINGS: The gray-white matter differentiation is normal.  The sella is appropriate in signal morphology.  The clival signal is intact.  The cerebellar tonsils are just above the level of the foramen magnum.  The odontoid process, the predental space, and the prevertebral soft tissues are also within normal limits for the patient?s age.  
No acute diffusion weighted abnormalities noted.
Axial FLAIR and T2 weighted images demonstrate multiple small focal signal hyperintensities in the subcortical white matter bifrontally, adjacent to the frontal horns and more so in the centrum semi ovale bilaterally.  
A few foci are also seen in the biparietal subcortical regions including the splenium of the corpus callosum.  
The ventricles are within normal limits.  Mastoids are well developed and aerated.  The internal auditory canals are symmetrical in signal morphology.  
Flow voids are maintained in the major vessels of the cranial skull base.  There is mild thickening of the mucosa in the ethmoid air cells, the left nasal cavity, and to a lesser degree the frontal sinuses.  The visualized orbital contents demonstrate possible previous eye lens surgery on the left.
No pathological intracranial enhancement is noted.   Dural venous sinuses are patent.
IMPRESSION: 1.  No evidence of acute ischemia.
2.  Nonspecific subcortical white matter changes supratentorially.  These may be related to small vessel diseases, hypertension and/or diabetes versus a demyelinating process or a vasculitis. 
3.  No pathological enhancement noted.  Mild sinus disease in the ethmoids and the left nasal passage.
4.  Incidental note made of probable prominent perivascular spaces in the cerebral peduncles bilaterally.

## 2009-04-22 ENCOUNTER — Telehealth: Payer: Self-pay | Admitting: *Deleted

## 2009-04-24 ENCOUNTER — Telehealth: Payer: Self-pay | Admitting: Sports Medicine

## 2009-05-02 ENCOUNTER — Telehealth: Payer: Self-pay | Admitting: *Deleted

## 2009-05-05 ENCOUNTER — Telehealth (INDEPENDENT_AMBULATORY_CARE_PROVIDER_SITE_OTHER): Payer: Self-pay | Admitting: Family Medicine

## 2009-05-13 ENCOUNTER — Ambulatory Visit: Payer: Self-pay | Admitting: Family Medicine

## 2009-05-13 ENCOUNTER — Encounter: Payer: Self-pay | Admitting: Sports Medicine

## 2009-05-14 LAB — CONVERTED CEMR LAB
ALT: 26 units/L (ref 0–53)
AST: 36 U/L (ref 0–37)
Albumin: 3.9 g/dL (ref 3.5–5.2)
Alkaline Phosphatase: 78 units/L (ref 39–117)
BUN: 14 mg/dL (ref 6–23)
CO2: 31 meq/L (ref 19–32)
Calcium: 9.2 mg/dL (ref 8.4–10.5)
Chloride: 96 meq/L (ref 96–112)
Creatinine, Ser: 0.82 mg/dL (ref 0.40–1.50)
Direct LDL: 125 mg/dL — ABNORMAL HIGH
GGT: 77 U/L — ABNORMAL HIGH (ref 7–51)
Glucose, Bld: 77 mg/dL (ref 70–99)
HCT: 47 % (ref 39.0–52.0)
Hemoglobin: 15.4 g/dL (ref 13.0–17.0)
MCHC: 32.8 g/dL (ref 30.0–36.0)
MCV: 100.6 fL — ABNORMAL HIGH (ref 78.0–100.0)
Platelets: 226 K/uL (ref 150–400)
Potassium: 3 meq/L — ABNORMAL LOW (ref 3.5–5.3)
RBC: 4.67 M/uL (ref 4.22–5.81)
RDW: 17.2 % — ABNORMAL HIGH (ref 11.5–15.5)
Sodium: 139 meq/L (ref 135–145)
TSH: 1.435 microintl units/mL (ref 0.350–4.500)
Total Bilirubin: 0.8 mg/dL (ref 0.3–1.2)
Total CK: 79 U/L (ref 7–232)
Total Protein: 6.9 g/dL (ref 6.0–8.3)
Uric Acid, Serum: 10.3 mg/dL — ABNORMAL HIGH (ref 4.0–7.8)
WBC: 8.2 10*3/uL (ref 4.0–10.5)

## 2009-05-21 ENCOUNTER — Ambulatory Visit: Payer: Self-pay | Admitting: Family Medicine

## 2009-05-21 ENCOUNTER — Encounter: Payer: Self-pay | Admitting: Sports Medicine

## 2009-05-22 LAB — CONVERTED CEMR LAB
BUN: 16 mg/dL (ref 6–23)
CO2: 31 meq/L (ref 19–32)
Calcium: 8.9 mg/dL (ref 8.4–10.5)
Chloride: 99 meq/L (ref 96–112)
Creatinine, Ser: 0.94 mg/dL (ref 0.40–1.50)
Glucose, Bld: 74 mg/dL (ref 70–99)
Potassium: 3 meq/L — ABNORMAL LOW (ref 3.5–5.3)
Sodium: 143 meq/L (ref 135–145)

## 2009-05-28 ENCOUNTER — Ambulatory Visit: Payer: Self-pay | Admitting: Family Medicine

## 2009-05-28 ENCOUNTER — Encounter: Payer: Self-pay | Admitting: Sports Medicine

## 2009-05-28 LAB — CONVERTED CEMR LAB
BUN: 11 mg/dL (ref 6–23)
CO2: 34 meq/L — ABNORMAL HIGH (ref 19–32)
Calcium: 9.1 mg/dL (ref 8.4–10.5)
Chloride: 103 meq/L (ref 96–112)
Creatinine, Ser: 0.76 mg/dL (ref 0.40–1.50)
Glucose, Bld: 71 mg/dL (ref 70–99)
Potassium: 3.8 meq/L (ref 3.5–5.3)
Sodium: 142 meq/L (ref 135–145)

## 2009-05-29 ENCOUNTER — Ambulatory Visit (HOSPITAL_COMMUNITY): Admission: RE | Admit: 2009-05-29 | Discharge: 2009-05-29 | Payer: Self-pay | Admitting: Sports Medicine

## 2009-05-29 ENCOUNTER — Ambulatory Visit: Payer: Self-pay | Admitting: Internal Medicine

## 2009-05-29 ENCOUNTER — Encounter: Payer: Self-pay | Admitting: Sports Medicine

## 2009-06-04 ENCOUNTER — Telehealth: Payer: Self-pay | Admitting: Sports Medicine

## 2009-06-09 ENCOUNTER — Telehealth: Payer: Self-pay | Admitting: Sports Medicine

## 2009-06-20 ENCOUNTER — Telehealth (INDEPENDENT_AMBULATORY_CARE_PROVIDER_SITE_OTHER): Payer: Self-pay | Admitting: *Deleted

## 2009-07-03 ENCOUNTER — Ambulatory Visit: Payer: Self-pay | Admitting: Family Medicine

## 2009-07-03 ENCOUNTER — Encounter: Payer: Self-pay | Admitting: Sports Medicine

## 2009-07-07 ENCOUNTER — Telehealth: Payer: Self-pay | Admitting: Sports Medicine

## 2009-07-07 ENCOUNTER — Telehealth (INDEPENDENT_AMBULATORY_CARE_PROVIDER_SITE_OTHER): Payer: Self-pay | Admitting: Family Medicine

## 2009-07-07 LAB — CONVERTED CEMR LAB
ALT: 19 U/L (ref 0–53)
AST: 26 U/L (ref 0–37)
Albumin: 3.5 g/dL (ref 3.5–5.2)
Alkaline Phosphatase: 76 units/L (ref 39–117)
BUN: 15 mg/dL (ref 6–23)
CO2: 25 meq/L (ref 19–32)
Calcium: 8.7 mg/dL (ref 8.4–10.5)
Chloride: 99 meq/L (ref 96–112)
Creatinine, Ser: 0.73 mg/dL (ref 0.40–1.50)
GGT: 55 U/L — ABNORMAL HIGH (ref 7–51)
Glucose, Bld: 71 mg/dL (ref 70–99)
HCT: 42.4 % (ref 39.0–52.0)
Hemoglobin: 14.4 g/dL (ref 13.0–17.0)
LDH: 198 units/L (ref 94–250)
MCHC: 34 g/dL (ref 30.0–36.0)
MCV: 106.3 fL — ABNORMAL HIGH (ref 78.0–100.0)
Platelets: 301 10*3/uL (ref 150–400)
Potassium: 3.2 meq/L — ABNORMAL LOW (ref 3.5–5.3)
RBC: 3.99 M/uL — ABNORMAL LOW (ref 4.22–5.81)
RDW: 18.6 % — ABNORMAL HIGH (ref 11.5–15.5)
Sed Rate: 5 mm/hr (ref 0–16)
Sodium: 139 meq/L (ref 135–145)
Total Bilirubin: 0.5 mg/dL (ref 0.3–1.2)
Total CK: 97 units/L (ref 7–232)
Total Protein: 6.1 g/dL (ref 6.0–8.3)
Uric Acid, Serum: 9.2 mg/dL — ABNORMAL HIGH (ref 4.0–7.8)
WBC: 8 10*3/microliter (ref 4.0–10.5)

## 2009-07-10 ENCOUNTER — Ambulatory Visit: Payer: Self-pay | Admitting: Family Medicine

## 2009-07-10 ENCOUNTER — Encounter: Payer: Self-pay | Admitting: Sports Medicine

## 2009-07-10 LAB — CONVERTED CEMR LAB
BUN: 11 mg/dL (ref 6–23)
CO2: 25 meq/L (ref 19–32)
Calcium: 8.9 mg/dL (ref 8.4–10.5)
Chloride: 98 meq/L (ref 96–112)
Creatinine, Ser: 0.73 mg/dL (ref 0.40–1.50)
Glucose, Bld: 65 mg/dL — ABNORMAL LOW (ref 70–99)
Potassium: 3.3 meq/L — ABNORMAL LOW (ref 3.5–5.3)
Sodium: 137 meq/L (ref 135–145)

## 2009-07-12 ENCOUNTER — Telehealth: Payer: Self-pay | Admitting: Family Medicine

## 2009-07-14 ENCOUNTER — Ambulatory Visit: Payer: Self-pay | Admitting: Internal Medicine

## 2009-07-15 ENCOUNTER — Encounter (INDEPENDENT_AMBULATORY_CARE_PROVIDER_SITE_OTHER): Payer: Self-pay | Admitting: *Deleted

## 2009-07-15 ENCOUNTER — Inpatient Hospital Stay (HOSPITAL_COMMUNITY): Admission: EM | Admit: 2009-07-15 | Discharge: 2009-07-24 | Payer: Self-pay | Admitting: Emergency Medicine

## 2009-07-15 ENCOUNTER — Ambulatory Visit: Payer: Self-pay | Admitting: Cardiology

## 2009-07-16 ENCOUNTER — Encounter: Payer: Self-pay | Admitting: Cardiology

## 2009-07-21 ENCOUNTER — Telehealth: Payer: Self-pay | Admitting: Sports Medicine

## 2009-07-21 ENCOUNTER — Ambulatory Visit: Payer: Self-pay | Admitting: Family Medicine

## 2009-07-28 ENCOUNTER — Telehealth: Payer: Self-pay | Admitting: Sports Medicine

## 2009-08-04 ENCOUNTER — Ambulatory Visit: Payer: Self-pay | Admitting: Family Medicine

## 2009-08-04 ENCOUNTER — Encounter: Payer: Self-pay | Admitting: Sports Medicine

## 2009-08-04 LAB — CONVERTED CEMR LAB
BUN: 12 mg/dL (ref 6–23)
CO2: 27 meq/L (ref 19–32)
Calcium: 9.1 mg/dL (ref 8.4–10.5)
Chloride: 105 meq/L (ref 96–112)
Creatinine, Ser: 0.79 mg/dL (ref 0.40–1.50)
Glucose, Bld: 72 mg/dL (ref 70–99)
Potassium: 3.6 meq/L (ref 3.5–5.3)
Sodium: 142 meq/L (ref 135–145)

## 2009-08-07 ENCOUNTER — Telehealth: Payer: Self-pay | Admitting: Sports Medicine

## 2009-08-20 ENCOUNTER — Telehealth: Payer: Self-pay | Admitting: Sports Medicine

## 2009-08-29 ENCOUNTER — Telehealth: Payer: Self-pay | Admitting: Sports Medicine

## 2009-09-04 ENCOUNTER — Encounter: Payer: Self-pay | Admitting: Sports Medicine

## 2009-09-04 ENCOUNTER — Telehealth: Payer: Self-pay | Admitting: *Deleted

## 2009-09-08 ENCOUNTER — Telehealth: Payer: Self-pay | Admitting: Sports Medicine

## 2009-10-21 ENCOUNTER — Telehealth: Payer: Self-pay | Admitting: Sports Medicine

## 2009-11-13 ENCOUNTER — Telehealth: Payer: Self-pay | Admitting: Sports Medicine

## 2009-11-18 ENCOUNTER — Encounter: Payer: Self-pay | Admitting: Internal Medicine

## 2009-11-20 ENCOUNTER — Encounter: Payer: Self-pay | Admitting: *Deleted

## 2009-11-20 ENCOUNTER — Telehealth: Payer: Self-pay | Admitting: *Deleted

## 2009-12-04 ENCOUNTER — Ambulatory Visit: Payer: Self-pay | Admitting: Internal Medicine

## 2009-12-11 ENCOUNTER — Ambulatory Visit: Payer: Self-pay | Admitting: Family Medicine

## 2009-12-11 LAB — CONVERTED CEMR LAB
Bilirubin Urine: NEGATIVE
Blood in Urine, dipstick: NEGATIVE
Glucose, Urine, Semiquant: NEGATIVE
Hgb A1c MFr Bld: 5.3 %
Ketones, urine, test strip: NEGATIVE
Nitrite: NEGATIVE
Protein, U semiquant: NEGATIVE
Specific Gravity, Urine: 1.02
Urobilinogen, UA: 2
WBC Urine, dipstick: NEGATIVE
pH: 8.5

## 2009-12-12 ENCOUNTER — Encounter (INDEPENDENT_AMBULATORY_CARE_PROVIDER_SITE_OTHER): Payer: Self-pay | Admitting: *Deleted

## 2009-12-22 ENCOUNTER — Encounter: Payer: Self-pay | Admitting: Sports Medicine

## 2009-12-24 ENCOUNTER — Telehealth (INDEPENDENT_AMBULATORY_CARE_PROVIDER_SITE_OTHER): Payer: Self-pay

## 2010-01-12 ENCOUNTER — Telehealth: Payer: Self-pay | Admitting: *Deleted

## 2010-01-15 ENCOUNTER — Encounter (INDEPENDENT_AMBULATORY_CARE_PROVIDER_SITE_OTHER): Payer: Self-pay | Admitting: *Deleted

## 2010-01-19 ENCOUNTER — Telehealth: Payer: Self-pay | Admitting: Sports Medicine

## 2010-01-27 ENCOUNTER — Telehealth: Payer: Self-pay | Admitting: Sports Medicine

## 2010-02-25 ENCOUNTER — Ambulatory Visit: Admission: RE | Admit: 2010-02-25 | Discharge: 2010-02-25 | Payer: Self-pay | Source: Home / Self Care

## 2010-02-26 ENCOUNTER — Encounter: Payer: Self-pay | Admitting: Sports Medicine

## 2010-03-08 ENCOUNTER — Encounter: Payer: Self-pay | Admitting: Internal Medicine

## 2010-03-11 ENCOUNTER — Telehealth: Payer: Self-pay | Admitting: Sports Medicine

## 2010-03-19 NOTE — Assessment & Plan Note (Signed)
Summary: mobility exam,df   Vital Signs:  Patient profile:   60 year old male Weight:      205 pounds Temp:     98.2 degrees F oral Pulse rate:   68 / minute Pulse rhythm:   regular BP sitting:   132 / 92 Cuff size:   regular  Vitals Entered By: Loralee Pacas CMA (February 25, 2010 1:46 PM) CC: mobilty    Primary Care Provider:  Rodney Langton, MD  CC:  mobilty .  History of Present Illness: 60 yo male here for mobility eval.  Noted multiple difficulties with ADLs.  Form reviewed with patient and filled out.  Faxed.  Will be scanned for upload into the chart and to be appended to this office visit.  Current Medications (verified): 1)  Nexium 40 Mg Cpdr (Esomeprazole Magnesium) .... Take 1 Tablet By Mouth Once A Day 2)  Nitroglycerin 0.4 Mg Subl (Nitroglycerin) .... As Needed 3)  Methotrexate 2.5 Mg Tabs (Methotrexate Sodium) .... 3 Tabs By Mouth Q Weekly 4)  Alprazolam 1 Mg Tbdp (Alprazolam) .Marland Kitchen.. 1 Tablet Three Times A Day 5)  Cymbalta 60 Mg Cpep (Duloxetine Hcl) .Marland Kitchen.. 1 Capsule Two Times A Day 6)  Glucotrol Xl 5 Mg Xr24h-Tab (Glipizide) .Marland Kitchen.. 1 Tablet By Mouth Once Daily 7)  Prednisone 5 Mg Tabs (Prednisone) .Marland Kitchen.. 1 Tab By Mouth Once Daily 8)  Lisinopril-Hydrochlorothiazide 20-25 Mg Tabs (Lisinopril-Hydrochlorothiazide) .... One Half Tab By Mouth Daily 9)  Aspir-81 81 Mg Tbec (Aspirin) .... Once Daily 10)  Niaspan 1000 Mg Cr-Tabs (Niacin (Antihyperlipidemic)) .... One Tab By Mouth Qhs 11)  Ropinirole Hcl 2 Mg Tabs (Ropinirole Hcl) .... One Tab By Mouth Tid 12)  Oxycodone Hcl 5 Mg Tabs (Oxycodone Hcl) .... One Tab By Mouth Q8h As Needed For Pain 13)  Nasonex 50 Mcg/act Susp (Mometasone Furoate) .... 2 Sprays in Each Nostril Two Times A Day. 14)  Oxybutynin Chloride 5 Mg Tabs (Oxybutynin Chloride) .... One Tab By Mouth Two Times A Day 15)  Antivert 25 Mg Tabs (Meclizine Hcl) .... One Tab By Mouth Q8h As Needed For Vertigo. 16)  Metoprolol Tartrate 50 Mg Tabs (Metoprolol  Tartrate) .... One Tab By Mouth Bid 17)  Demadex 10 Mg Tabs (Torsemide) .... One Tab By Mouth Daily 18)  Folic Acid 1 Mg Tabs (Folic Acid) .... One Tab By Mouth On Days Not Taking Methotrexate. 19)  Potassium Chloride Crys Cr 20 Meq Cr-Tabs (Potassium Chloride Crys Cr) .... Two Tabs By Mouth Daily 20)  Promethazine Hcl 25 Mg Tabs (Promethazine Hcl) .... One Tab By Mouth Q6h As Needed For Nausea 21)  Combivent 18-103 Mcg/act Aero (Ipratropium-Albuterol) .... Oen To Two Puffs Q4h As Needed Sob/wheeze 22)  Hydrocortisone 2.5 % Oint (Hydrocortisone) .... Apply To Itchy Areas As Needed Two Times A Day 23)  Shower Chair .... Use As Needed  Allergies (verified): 1)  ! Pcn 2)  ! Codeine 3)  ! Wellbutrin 4)  ! * Zyban 5)  ! Cipro 6)  ! Flagyl 7)  ! * Ppd 8)  ! * Tetanus 9)  ! * Iv Ig 10)  ! * Statins 11)  ! Lexapro (Escitalopram Oxalate) 12)  ! Paxil (Paroxetine Hcl) 13)  ! Prozac 14)  ! Advair Diskus (Fluticasone-Salmeterol) 15)  ! Protopic (Tacrolimus) 16)  ! Temovate E (Clobetasol Prop Emollient Base) 17)  ! Betamethasone Dipropionate (Betamethasone Dipropionate) 18)  ! Lasix  Review of Systems       See HPI  Physical Exam  General:  Well-developed,well-nourished,in no acute distress; alert,appropriate and cooperative throughout examination Additional Exam:  See scanned mobility eval form for further details of the examination during this visit.   Impression & Recommendations:  Problem # 1:  WEAKNESS (ICD-780.79) Assessment New Mobility evaluation performed with patient. Filled out for Power joystick wheelchair. Faxed. Will scan and attach to this office visit for further documentation. I spend 25 mins with this patient in face to face time.  Orders: [facility]- Est  Level 4 (99214)  Complete Medication List: 1)  Nexium 40 Mg Cpdr (Esomeprazole magnesium) .... Take 1 tablet by mouth once a day 2)  Nitroglycerin 0.4 Mg Subl (Nitroglycerin) .... As needed 3)  Methotrexate 2.5  Mg Tabs (Methotrexate sodium) .... 3 tabs by mouth q weekly 4)  Alprazolam 1 Mg Tbdp (Alprazolam) .Marland Kitchen.. 1 tablet three times a day 5)  Cymbalta 60 Mg Cpep (Duloxetine hcl) .Marland Kitchen.. 1 capsule two times a day 6)  Glucotrol Xl 5 Mg Xr24h-tab (Glipizide) .Marland Kitchen.. 1 tablet by mouth once daily 7)  Prednisone 5 Mg Tabs (Prednisone) .Marland Kitchen.. 1 tab by mouth once daily 8)  Lisinopril-hydrochlorothiazide 20-25 Mg Tabs (Lisinopril-hydrochlorothiazide) .... One half tab by mouth daily 9)  Aspir-81 81 Mg Tbec (Aspirin) .... Once daily 10)  Niaspan 1000 Mg Cr-tabs (Niacin (antihyperlipidemic)) .... One tab by mouth qhs 11)  Ropinirole Hcl 2 Mg Tabs (Ropinirole hcl) .... One tab by mouth tid 12)  Oxycodone Hcl 5 Mg Tabs (Oxycodone hcl) .... One tab by mouth q8h as needed for pain 13)  Nasonex 50 Mcg/act Susp (Mometasone furoate) .... 2 sprays in each nostril two times a day. 14)  Oxybutynin Chloride 5 Mg Tabs (Oxybutynin chloride) .... One tab by mouth two times a day 15)  Antivert 25 Mg Tabs (Meclizine hcl) .... One tab by mouth q8h as needed for vertigo. 16)  Metoprolol Tartrate 50 Mg Tabs (Metoprolol tartrate) .... One tab by mouth bid 17)  Demadex 10 Mg Tabs (Torsemide) .... One tab by mouth daily 18)  Folic Acid 1 Mg Tabs (Folic acid) .... One tab by mouth on days not taking methotrexate. 19)  Potassium Chloride Crys Cr 20 Meq Cr-tabs (Potassium chloride crys cr) .... Two tabs by mouth daily 20)  Promethazine Hcl 25 Mg Tabs (Promethazine hcl) .... One tab by mouth q6h as needed for nausea 21)  Combivent 18-103 Mcg/act Aero (Ipratropium-albuterol) .... Oen to two puffs q4h as needed sob/wheeze 22)  Hydrocortisone 2.5 % Oint (Hydrocortisone) .... Apply to itchy areas as needed two times a day 23)  Paediatric nurse  .... Use as needed   Orders Added: 1)  [facility]- Est  Level 4 [84696]    Last HGBA1C:  5.3 (12/11/2009 1:56:57 PM) HGBA1C Next Due:  6 mo

## 2010-03-19 NOTE — Code Status and Advance Directives (Signed)
Summary: ROI  pt signed ROI to release his records to a rheumatologist, he will call us back once he knows which one he will see. See Knox Royalty. Knox Royalty  December 12, 2009 9:39 AM

## 2010-03-19 NOTE — Assessment & Plan Note (Signed)
Summary: BP CHECK/tcb  Nurse Visit   Vital Signs:  Patient profile:   60 year old male Pulse rate:   92 / minute BP sitting:   120 / 82  (left arm)  Vitals Entered By: Theresia Lo RN (April 08, 2009 2:39 PM) patient states he is taking BP meds as directed. Is currently taking metoprolol 25 mg two tabs twice daily.  has enough to last 15 days . he will need a new RX sent to [facility] for the 50 mg tab with instructions to take one twice daily.   patient  will need to be reminded that this is an increased dose and only  to take one twice daily.  Patient reports continued falling and excessive sleepiness. appointment scheduled with Dr. Benjamin Stain for 04/11/2009 to follow up. Also patient just came from Dr. Ines Bloomer office with new instructions for methotrexate to take 3 pills per week, prednisone 5 mg daily. will call Dr. Ines Bloomer office to request notes of office visit at patient's request. Theresia Lo RN  April 08, 2009 2:47 PM   Allergies: 1)  ! Pcn 2)  ! Codeine 3)  ! Wellbutrin 4)  ! * Zyban 5)  ! Cipro 6)  ! Flagyl 7)  ! * Ppd 8)  ! * Tetanus 9)  ! * Iv Ig 10)  ! * Statins 11)  ! Lexapro (Escitalopram Oxalate) 12)  ! Paxil (Paroxetine Hcl) 13)  ! Prozac 14)  ! Advair Diskus (Fluticasone-Salmeterol) 15)  ! Protopic (Tacrolimus) 16)  ! Temovate E (Clobetasol Prop Emollient Base) 17)  ! Betamethasone Dipropionate (Betamethasone Dipropionate)  Orders Added: 1)  No Charge Patient Arrived (NCPA0) [NCPA0]

## 2010-03-19 NOTE — Assessment & Plan Note (Signed)
 Summary: FU/KH   Vital Signs:  Patient profile:   60 year old male Height:      70 inches Temp:     98.2 degrees F oral Pulse rate:   97 / minute BP sitting:   134 / 81  (left arm) CC: F/U  Is Patient Diabetic? Yes Did you bring your meter with you today? No Pain Assessment Patient in pain? yes     Location: all over Intensity: 6 Type: aching Onset of pain  Chronic   Primary Care Provider:  Debby Petties, MD  CC:  F/U .  History of Present Illness: 60y M with dermatomyositis here for fu.  Glossitis: symptoms starting to return now off of nystatin .  Mouth dry, tongue swollen.  Dermatomyositis:  60y Pt now back on methotrexate , still taking folate/b12.  Vertigo:  Wants something for this.    Chronic Pain:  Has 55 oxycodone  5mg  tabs left.  Controlling pain well at the moment.  PT: Saw therapist, has appt again on the 16th, and then the 23rd.  Allergies: 1)  ! Pcn 2)  ! Codeine 3)  ! Wellbutrin 4)  ! * Zyban 5)  ! Cipro 6)  ! Flagyl 7)  ! * Ppd 8)  ! * Tetanus 9)  ! * Iv Ig 10)  ! * Statins 11)  ! Lexapro (Escitalopram Oxalate) 12)  ! Paxil (Paroxetine Hcl) 13)  ! Prozac 14)  ! Advair Diskus (Fluticasone -Salmeterol) 15)  ! Protopic (Tacrolimus) 16)  ! Temovate E (Clobetasol Prop Emollient Base) 17)  ! 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) FIBROMYALGIA (ICD-729.1) VERTIGO (ICD-780.4) TREMOR, RIGHT HAND (ICD-781.0) SPECIAL SCREENING MALIGNANT NEOPLASM OF PROSTATE (ICD-V76.44) GASTRITIS, HX OF (ICD-V12.79) HEMORRHOIDS, INTERNAL  (ICD-455.0) DIVERTICULITIS, HX OF (ICD-V12.79) ANXIETY, CHRONIC (ICD-300.00) DIVERTICULOSIS OF COLON (ICD-562.10) COLONIC POLYPS, ADENOMATOUS, HX OF (ICD-V12.72) NEPHROLITHIASIS, HX OF (ICD-V13.01) ANEMIA, HX OF (ICD-V12.3) Hx of ESOPHAGEAL STRICTURE (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) DERMATOMYOSITIS (ICD-710.3) ARTHRITIS (ICD-716.90) RHINITIS (ICD-472.0) GENERALIZED PAIN (ICD-780.96)   DERMATOMYOSITIS (ICD-710.3) ARTHRITIS (ICD-716.90) GERD (ICD-530.81) HYPERTENSION (ICD-401.9) HYPERLIPIDEMIA (ICD-272.4) Hx MI 1999, 2000, 2004  Past Surgical History: Last updated: 01/26/2008 knee surgery for meniscal tear lumbar diskectomy  Family History: Last updated: 05/31/2008 Mother: Brain Tumor, Heart disease, stroke, DM, ETOH addiction Biological father: unknown  Half Brother: DM, Obesity  Social History: Last updated: 05/31/2008 Occupation: Disabled, Wheelchair Bound Patient currently smokes.  Alcohol  Use - yes red wine everyday Illicit Drug Use - no Pt lives alone.  No help at home.  Does ADLs himself.  Physical Exam  General:  Well-developed,well-nourished,in no acute distress; alert,appropriate and cooperative throughout examination Mouth:  Tongue starting to appear beefy again. Lungs:  Normal respiratory effort, chest expands symmetrically. Lungs are clear to auscultation, no crackles or wheezes. Heart:  Normal rate and regular rhythm. S1 and S2 normal without gallop, murmur, click, rub or other extra sounds.   Impression & Recommendations:  Problem # 1:  GLOSSITIS (ICD-529.0) Assessment Deteriorated Restart nystatin , continue until symptoms resolved x 2 weeks.  May stop MVI.  Orders: [facility]- Est  Level 4 (00785)  Problem # 2:  VERTIGO (ICD-780.4) Assessment: Deteriorated Try antivert  for now.  His updated medication list for this problem includes:    Antivert  25 Mg Tabs (Meclizine   hcl) ..... One tab by mouth q8h as needed for  vertigo.  Problem # 3:  DERMATOMYOSITIS (ICD-710.3) Assessment: Unchanged Restarted MTX, pt needs to return to rheumatologist.  States he tried to make appts but was unable. States heliotrope rash returning intermittently.  Problem # 4:  GENERALIZED PAIN (ICD-780.96) Assessment: Unchanged Has enough oxycodone  for 18 days, will refill but make note on script not to refill until 01/13/09.  Orders: [facility]- Est  Level 4 (99214)  Complete Medication List: 1)  Nexium  40 Mg Cpdr (Esomeprazole  magnesium ) .... Take 1 tablet by mouth once a day 2)  Nitroglycerin  0.4 Mg Subl (Nitroglycerin ) .... As needed 3)  Methotrexate  2.5 Mg Tabs (Methotrexate  sodium) .... 5 tabs by mouth q weekly 4)  Alprazolam  1 Mg Tbdp (Alprazolam ) .SABRA.. 1 tablet three times a day 5)  Cymbalta  60 Mg Cpep (Duloxetine  hcl) .SABRA.. 1 capsule two times a day 6)  Glucotrol  Xl 5 Mg Xr24h-tab (Glipizide ) .SABRA.. 1 tablet by mouth once daily 7)  Prednisone  5 Mg Tabs (Prednisone ) .... 2 tablets by mouth once daily 8)  Lisinopril -hydrochlorothiazide  20-25 Mg Tabs (Lisinopril -hydrochlorothiazide ) .... Once daily 9)  Aspir-81 81 Mg Tbec (Aspirin ) .... Once daily 10)  Niaspan  1000 Mg Cr-tabs (Niacin  (antihyperlipidemic)) .... One tab by mouth qhs 11)  Ropinirole  Hcl 1 Mg Tabs (Ropinirole  hcl) .... Two tabs by mouth three times a day 12)  Oxycodone  Hcl 5 Mg Tabs (Oxycodone  hcl) .... One tab by mouth q8h as needed for pain 13)  Nasonex  50 Mcg/act Susp (Mometasone  furoate) .... 2 sprays in each nostril two times a day. 14)  Oxybutynin  Chloride 5 Mg Tabs (Oxybutynin  chloride) .... One tab by mouth two times a day 15)  Nystatin  100000 Unit/ml Susp (Nystatin ) .... 6ml by mouth, swish, keep in mouth as long as possible, then swallow.  do this 4x/day.  continue at least 48h after symptoms resolve. 16)  Antivert  25 Mg Tabs (Meclizine  hcl) .... One tab by mouth q8h as needed for vertigo.  Patient Instructions: 1)  Stop the vitamins,  2)  Start  Meclizine , 3)  Refilled oxycodone  for 18 days from now. 4)  Continue with the physical therapist. 5)  Use the nystatin  again whenever you get mouth symptoms. 6)  Come back to see me as needed.  Keep trying to get an appt to see the rheumatologist. 7)  -Dr. ONEIDA. Prescriptions: OXYCODONE  HCL 5 MG TABS (OXYCODONE  HCL) One tab by mouth q8h as needed for pain  #180 x 0   Entered and Authorized by:   Debby Petties MD   Signed by:   Debby Petties MD on 12/26/2008   Method used:   Print then Give to Patient   RxID:   [id number] ANTIVERT  25 MG TABS (MECLIZINE  HCL) One tab by mouth q8h as needed for vertigo.  #30 x 6   Entered and Authorized by:   Debby Petties MD   Signed by:   Debby Petties MD on 12/26/2008   Method used:   Print then Give to Patient   RxID:   [id number]

## 2010-03-19 NOTE — Progress Notes (Signed)
Summary: [facility]  Phone Note Call from Patient Call back at Home Phone [phone]   Caller: Patient Summary of Call: Has some concerns about his meds. Initial call taken by: Clydell Hakim,  June 09, 2009 8:41 AM  Follow-up for Phone Call        patient had already been told  about 2 D echo results. he has a concern about prednisone. states he has gradulally been decreasing dosage  because at one point  he was on 60 mg daily.  now his concern is that his left hand has millions of paper cuts just like the right hand which Dr. Lovenia Shuck has seen in the past. also he is having some incontinence  of urine again and problem with bowels. sometimes he has constipation and then again he will have diarrhea.Marland Kitchen offered him a work in appointment for all these issues but he states he is unable to come in to see MD again until after May 9 because he has so many appointments until then. appointment scheduled 06/26/2009. advised if he feels he needs to come in sooner and can arrange we can can schedule sooner for work in appointment. Follow-up by: Theresia Lo RN,  June 09, 2009 9:59 AM  Additional Follow-up for Phone Call Additional follow up Details #1::        Thanks Larita Fife, he should direct questions re: prednisone to his Rheumatologist dr Kellie Simmering (currently tapering him off prednisone). As for diarrhea/constipation, he can be seen as needed for this. Should do regular wound care for the paper cuts, keep them clean, use neospirin, keep open sores covered up.  Avoid trauma to hands if at all possible. Thanks! Additional Follow-up by: Rodney Langton MD,  June 09, 2009 11:38 AM    Additional Follow-up for Phone Call Additional follow up Details #2::    message left to return call. Follow-up by: Theresia Lo RN,  June 09, 2009 1:59 PM  Additional Follow-up for Phone Call Additional follow up Details #3:: Details for Additional Follow-up Action Taken: patient notified of message from  MD. Additional Follow-up by: Theresia Lo RN,  June 09, 2009 4:30 PM

## 2010-03-19 NOTE — Progress Notes (Signed)
Summary: refill  Phone Note Refill Request Call back at Home Phone [phone] Call back at [phone] Message from:  Patient  Refills Requested: Medication #1:  OXYCODONE HCL 5 MG TABS One tab by mouth q8h as needed for pain Please call when ready  Initial call taken by: De Nurse,  March 11, 2010 10:56 AM  Follow-up for Phone Call        Script at front. Follow-up by: Rodney Langton MD,  March 12, 2010 1:34 PM  Additional Follow-up for Phone Call Additional follow up Details #1::        pt called & informed ready for p/u Additional Follow-up by: Knox Royalty,  March 13, 2010 8:46 AM    Prescriptions: OXYCODONE HCL 5 MG TABS (OXYCODONE HCL) One tab by mouth q8h as needed for pain  #180 x 0   Entered and Authorized by:   Rodney Langton MD   Signed by:   Rodney Langton MD on 03/12/2010   Method used:   Print then Give to Patient   RxID:   [id number]

## 2010-03-19 NOTE — Assessment & Plan Note (Signed)
Summary: FU/KH   Vital Signs:  Patient profile:   60 year old male Temp:     97.9 degrees F Pulse rate:   80 / minute BP sitting:   92 / 60  Vitals Entered By: Jone Baseman CMA (May 13, 2009 2:05 PM) CC: f/u meds Pain Assessment Patient in pain? yes     Location: all over Intensity: 10   Primary Care Provider:  Rodney Langton, MD  CC:  f/u meds.  History of Present Illness: Fall:  Recent accidental fall trying to pick up a pebble, wheelchair fell on him, never went to ED.  Pain in R shoulder and ribs.  heart disease:  Getting ECHO, Demadex 10 improved edema greatly!  HTN:  BP a little low today.  Asymptomatic.  HLD:  Due for lipids.  Will check dLDL.  On Niacin.  DM2:  Last A1c controlled.  No changes.  Checking a1c today.  Dermatomyositis: Followed by rheum, pred decreased, mtx unchanged.  Rhem needs some bloodwork checked.   Generalized pain: Needs refill on oxycodone, controls pain.  Habits & Providers  Alcohol-Tobacco-Diet     Tobacco Status: current     Tobacco Counseling: to quit use of tobacco products     Cigarette Packs/Day: <0.25  Current Medications (verified): 1)  Nexium 40 Mg Cpdr (Esomeprazole Magnesium) .... Take 1 Tablet By Mouth Once A Day 2)  Nitroglycerin 0.4 Mg Subl (Nitroglycerin) .... As Needed 3)  Methotrexate 2.5 Mg Tabs (Methotrexate Sodium) .... 3 Tabs By Mouth Q Weekly 4)  Alprazolam 1 Mg Tbdp (Alprazolam) .Marland Kitchen.. 1 Tablet Three Times A Day 5)  Cymbalta 60 Mg Cpep (Duloxetine Hcl) .Marland Kitchen.. 1 Capsule Two Times A Day 6)  Glucotrol Xl 5 Mg Xr24h-Tab (Glipizide) .Marland Kitchen.. 1 Tablet By Mouth Once Daily 7)  Prednisone 5 Mg Tabs (Prednisone) .Marland Kitchen.. 1 Tab By Mouth Once Daily 8)  Lisinopril-Hydrochlorothiazide 20-25 Mg Tabs (Lisinopril-Hydrochlorothiazide) .... One Half Tab By Mouth Daily 9)  Aspir-81 81 Mg Tbec (Aspirin) .... Once Daily 10)  Niaspan 1000 Mg Cr-Tabs (Niacin (Antihyperlipidemic)) .... One Tab By Mouth Qhs 11)  Ropinirole Hcl 2 Mg  Tabs (Ropinirole Hcl) .... One Tab By Mouth Tid 12)  Oxycodone Hcl 5 Mg Tabs (Oxycodone Hcl) .... One Tab By Mouth Q8h As Needed For Pain 13)  Nasonex 50 Mcg/act Susp (Mometasone Furoate) .... 2 Sprays in Each Nostril Two Times A Day. 14)  Oxybutynin Chloride 5 Mg Tabs (Oxybutynin Chloride) .... One Tab By Mouth Two Times A Day 15)  Nystatin 100000 Unit/ml Susp (Nystatin) .... 6ml By Mouth, Swish, Keep in Mouth As Long As Possible, Then Swallow.  Do This 4x/day.  Continue At Least 48h After Symptoms Resolve. 16)  Antivert 25 Mg Tabs (Meclizine Hcl) .... One Tab By Mouth Q8h As Needed For Vertigo. 17)  Metoprolol Tartrate 50 Mg Tabs (Metoprolol Tartrate) .... One Tab By Mouth Bid 18)  Demadex 10 Mg Tabs (Torsemide) .... One Tab By Mouth Daily 19)  Folic Acid 1 Mg Tabs (Folic Acid) .... One Tab By Mouth On Days Not Taking Methotrexate.  Allergies (verified): 1)  ! Pcn 2)  ! Codeine 3)  ! Wellbutrin 4)  ! * Zyban 5)  ! Cipro 6)  ! Flagyl 7)  ! * Ppd 8)  ! * Tetanus 9)  ! * Iv Ig 10)  ! * Statins 11)  ! Lexapro (Escitalopram Oxalate) 12)  ! Paxil (Paroxetine Hcl) 13)  ! Prozac 14)  ! Advair Diskus (Fluticasone-Salmeterol) 15)  !  Protopic (Tacrolimus) 16)  ! Temovate E (Clobetasol Prop Emollient Base) 17)  ! Betamethasone Dipropionate (Betamethasone Dipropionate) 18)  ! 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) FIBROMYALGIA (ICD-729.1) VERTIGO (ICD-780.4) TREMOR, RIGHT HAND (ICD-781.0) SPECIAL SCREENING MALIGNANT  NEOPLASM OF PROSTATE (ICD-V76.44) GASTRITIS, HX OF (ICD-V12.79) HEMORRHOIDS, INTERNAL (ICD-455.0) DIVERTICULITIS, HX OF (ICD-V12.79) ANXIETY, CHRONIC (ICD-300.00) DIVERTICULOSIS OF COLON (ICD-562.10) COLONIC POLYPS, ADENOMATOUS, HX OF (ICD-V12.72) NEPHROLITHIASIS, HX OF (ICD-V13.01) ANEMIA, HX OF (ICD-V12.3) Hx of ESOPHAGEAL STRICTURE (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) DERMATOMYOSITIS (ICD-710.3) ARTHRITIS (ICD-716.90) RHINITIS (ICD-472.0) GENERALIZED PAIN (ICD-780.96)   DERMATOMYOSITIS (ICD-710.3) ARTHRITIS (ICD-716.90) GERD (ICD-530.81) HYPERTENSION (ICD-401.9) HYPERLIPIDEMIA (ICD-272.4) Hx MI 1999, 2000, 2004  Past Surgical History: Last updated: 01/26/2008 knee surgery for meniscal tear lumbar diskectomy  Family History: Last updated: 05/31/2008 Mother: Brain Tumor, Heart disease, stroke, DM, ETOH addiction Biological father: unknown  Half Brother: DM, Obesity  Social History: Last updated: 05/31/2008 Occupation: Disabled, Wheelchair Bound Patient currently smokes.  Alcohol Use - yes red wine everyday Illicit Drug Use - no Pt lives alone.  No help at home.  Does ADLs himself.  Social History: Packs/Day:  <0.25  Review of Systems       12 point ROS neg except as in HPI.  Physical Exam  General:  Well-developed,well-nourished,in no acute distress; alert,appropriate and cooperative throughout examination Head:  Normocephalic and atraumatic without obvious abnormalities.  Eyes:  No corneal or conjunctival inflammation noted. EOMI. Perrla. Ears:  External ear exam shows no significant lesions or deformities.  Nose:  External nasal examination shows no deformity or inflammation.  Mouth:  Oral mucosa and oropharynx without lesions or exudates.  Lungs:  Normal respiratory effort, chest expands symmetrically. Lungs are clear to auscultation, no crackles or wheezes. Heart:  Normal rate and regular rhythm. S1 and S2 normal without gallop, murmur,  click, rub or other extra sounds. Abdomen:  Bowel sounds positive,abdomen soft and non-tender without masses, organomegaly or hernias noted. Msk:  Limited abduction in R arm. Painful. Extremities:  No clubbing, cyanosis, or deformity noted with normal full range of motion of all joints.   2+ pitting edema in lower ext. Neurologic:  Grossly non-focal. Skin:  Cracks /dry skin on both hands. No purulence, no drainage.   Impression & Recommendations:  Problem # 1:  ACCIDENTAL FALL (ICD-E888.9) Assessment New XR affected joints/structures.  Oxycodone should control pain.  Orders: Diagnostic X-Ray/Fluoroscopy (Diagnostic X-Ray/Flu) CXR- 2view (CXR) [facility]- Est Level  5 (11914)  Problem # 2:  ISCHEMIC HEART DISEASE (ICD-414.9) Assessment: Unchanged ECHO tomo.  Lasix helping swelling.  Favor systolic CHF.  His updated medication list for this problem includes:    Nitroglycerin 0.4 Mg Subl (Nitroglycerin) .Marland Kitchen... As needed    Lisinopril-hydrochlorothiazide 20-25 Mg Tabs (Lisinopril-hydrochlorothiazide) ..... One half tab by mouth daily    Aspir-81 81 Mg Tbec (Aspirin) ..... Once daily    Metoprolol Tartrate 50 Mg Tabs (Metoprolol tartrate) ..... One tab by mouth bid    Demadex 10 Mg Tabs (Torsemide) ..... One tab by mouth daily  Orders: [facility]- Est Level  5 (78295)  Problem # 3:  HYPERTENSION (ICD-401.9) Assessment: Unchanged BP low, cut lisinopril/hctz tabs in half.  Recheck BP  at fu visit.  His updated medication list for this problem includes:    Lisinopril-hydrochlorothiazide 20-25 Mg Tabs (Lisinopril-hydrochlorothiazide) ..... One half tab by mouth daily    Metoprolol Tartrate 50 Mg Tabs (Metoprolol tartrate) ..... One tab by mouth bid    Demadex 10 Mg Tabs (Torsemide) ..... One tab by mouth daily  Orders: [facility]- Est Level  5 (81191)  Problem # 4:  HYPERLIPIDEMIA (ICD-272.4) Assessment: Unchanged HDL was low.  Due for FLP but pt often has trouble getting here in AM.  Checking dLDL  for now.  His updated medication list for this problem includes:    Niaspan 1000 Mg Cr-tabs (Niacin (antihyperlipidemic)) ..... One tab by mouth qhs  Orders: Direct LDL-[facility] ([id number]) [facility]- Est Level  5 (08657)  Problem # 5:  DIABETES MELLITUS, TYPE II (ICD-250.00) Assessment: Improved last A1c well controlled, due for another.  His updated medication list for this problem includes:    Glucotrol Xl 5 Mg Xr24h-tab (Glipizide) .Marland Kitchen... 1 tablet by mouth once daily    Lisinopril-hydrochlorothiazide 20-25 Mg Tabs (Lisinopril-hydrochlorothiazide) ..... One half tab by mouth daily    Aspir-81 81 Mg Tbec (Aspirin) ..... Once daily  Orders: A1C-[facility] (84696) [facility]- Est Level  5 (29528)  Problem # 6:  DERMATOMYOSITIS (ICD-710.3) Assessment: Unchanged Cont MTX, prednisone.  Rheum following.  Rheum MD requested the below labs.  Will forward when back.  On MTX but not on Folate, added folate in between MTX doses.  Skin cracking on hands.  Recommended less washing and emollients.  Orders: Comp Met-[facility] [phone]) CBC-[facility] [phone]) CK (Creatine Kinase)-[facility] [phone]) TSH-[facility] [phone]) Uric Acid-[facility] ([phone]) Sed Rate (ESR)-[facility] (60630) Miscellaneous Lab Charge-[facility] (16010) [facility]- Est Level  5 (93235)  Problem # 7:  GENERALIZED PAIN (ICD-780.96) Controlled with cymbalta/oxycodone.  Refilled oxycodone today.  Orders: [facility]- Est Level  5 (57322)  Complete Medication List: 1)  Nexium 40 Mg Cpdr (Esomeprazole magnesium) .... Take 1 tablet by mouth once a day 2)  Nitroglycerin 0.4 Mg Subl (Nitroglycerin) .... As needed 3)  Methotrexate 2.5 Mg Tabs (Methotrexate sodium) .... 3 tabs by mouth q weekly 4)  Alprazolam 1 Mg Tbdp (Alprazolam) .Marland Kitchen.. 1 tablet three times a day 5)  Cymbalta 60 Mg Cpep (Duloxetine hcl) .Marland Kitchen.. 1 capsule two times a day 6)  Glucotrol Xl 5 Mg Xr24h-tab (Glipizide) .Marland Kitchen.. 1 tablet by mouth once daily 7)  Prednisone 5 Mg Tabs (Prednisone) .Marland Kitchen.. 1 tab by mouth once daily 8)   Lisinopril-hydrochlorothiazide 20-25 Mg Tabs (Lisinopril-hydrochlorothiazide) .... One half tab by mouth daily 9)  Aspir-81 81 Mg Tbec (Aspirin) .... Once daily 10)  Niaspan 1000 Mg Cr-tabs (Niacin (antihyperlipidemic)) .... One tab by mouth qhs 11)  Ropinirole Hcl 2 Mg Tabs (Ropinirole hcl) .... One tab by mouth tid 12)  Oxycodone Hcl 5 Mg Tabs (Oxycodone hcl) .... One tab by mouth q8h as needed for pain 13)  Nasonex 50 Mcg/act Susp (Mometasone furoate) .... 2 sprays in each nostril two times a day. 14)  Oxybutynin Chloride 5 Mg Tabs (Oxybutynin chloride) .... One tab by mouth two times a day 15)  Nystatin 100000 Unit/ml Susp (Nystatin) .... 6ml by mouth, swish, keep in mouth as long as possible, then swallow.  do this 4x/day.  continue at least 48h after symptoms resolve. 16)  Antivert 25 Mg Tabs (Meclizine hcl) .... One tab by mouth q8h as needed for vertigo. 17)  Metoprolol Tartrate 50 Mg Tabs (Metoprolol tartrate) .... One tab by mouth bid 18)  Demadex 10 Mg Tabs (  Torsemide) .... One tab by mouth daily 19)  Folic Acid 1 Mg Tabs (Folic acid) .... One tab by mouth on days not taking methotrexate.  Patient Instructions: 1)  Decrease lisinopril/hctz to 1/2 tab daily 2)  will check labs now 3)  chest xray, shoulder x ray right side 4)  refilled oxycodone 5)  wash your hands once a day, keep lots of lotion on them. 6)  be sure to get your ECHO. 7)  -Dr. Karie Schwalbe.  Prescriptions: OXYCODONE HCL 5 MG TABS (OXYCODONE HCL) One tab by mouth q8h as needed for pain  #180 x 0   Entered and Authorized by:   Rodney Langton MD   Signed by:   Rodney Langton MD on 05/13/2009   Method used:   Print then Give to Patient   RxID:   [id number] FOLIC ACID 1 MG TABS (FOLIC ACID) One tab by mouth on days not taking methotrexate.  #90 x 11   Entered and Authorized by:   Rodney Langton MD   Signed by:   Rodney Langton MD on 05/13/2009   Method used:   Electronically to        [facility] (retail)       [name]       [place], [place]  [id number]       Ph: [phone]       Fax: [phone]   RxID:   [id number]            CMP ordered   CMP ordered       Appended Document: A1c = 5.5%,   ESR = 1 mm/hr    Lab Visit  Laboratory Results   Blood Tests   Date/Time Received: May 13, 2009 2:43 PM  Date/Time Reported: May 13, 2009 4:04 PM   HGBA1C: 5.5%   (Normal Range: Non-Diabetic - 3-6%   Control Diabetic - 6-8%) SED rate: 1 mm/hr  Comments: ...............test performed by......Marland KitchenBonnie A. [place], MLS (ASCP)cm    Orders Today:

## 2010-03-19 NOTE — Progress Notes (Signed)
Summary: phn msg  Phone Note Call from Patient Call back at [facility] Phone [phone]   Caller: Patient Summary of Call: Pt stating Dr. Roberts Gaudy nurse was to call him back. Initial call taken by: Clydell Hakim,  May 05, 2009 1:54 PM  Follow-up for Phone Call        Notified pt to bring lab paperwork with him to his next appt on the 29,  when he can get into the office. Follow-up by: Gladstone Pih,  May 05, 2009 2:39 PM

## 2010-03-19 NOTE — Assessment & Plan Note (Signed)
Summary: bladder problem,tcb   Vital Signs:  Patient profile:   60 year old male Height:      70 inches Weight:      236.0 pounds BMI:     33.98 Temp:     98.6 degrees F oral Pulse rate:   60 / minute BP sitting:   116 / 78  (left arm) Cuff size:   large  Vitals Entered By: Gladstone Pih (May 21, 2009 11:35 AM) CC: F/U Is Patient Diabetic? Yes   Primary Care Provider:  Rodney Langton, MD  CC:  F/U.  History of Present Illness: Here to discuss:  Missing ECHO:  was raining, rescheduled for 4/16 at 3pm at hospital.  No CP, No SOB, lost some weight.  Go over labwork:  Was hypokalemic.  Other labs faxed to dr. Kellie Simmering.  C/o leaking urine and dry mouth:  On oxybutinin.  Recent fall:  Never got XR, symptoms improving, doesn' want XRs anymore.  Habits & Providers  Alcohol-Tobacco-Diet     Tobacco Status: current     Tobacco Counseling: to quit use of tobacco products     Cigarette Packs/Day: <0.25  Current Medications (verified): 1)  Nexium 40 Mg Cpdr (Esomeprazole Magnesium) .... Take 1 Tablet By Mouth Once A Day 2)  Nitroglycerin 0.4 Mg Subl (Nitroglycerin) .... As Needed 3)  Methotrexate 2.5 Mg Tabs (Methotrexate Sodium) .... 3 Tabs By Mouth Q Weekly 4)  Alprazolam 1 Mg Tbdp (Alprazolam) .Marland Kitchen.. 1 Tablet Three Times A Day 5)  Cymbalta 60 Mg Cpep (Duloxetine Hcl) .Marland Kitchen.. 1 Capsule Two Times A Day 6)  Glucotrol Xl 5 Mg Xr24h-Tab (Glipizide) .Marland Kitchen.. 1 Tablet By Mouth Once Daily 7)  Prednisone 5 Mg Tabs (Prednisone) .Marland Kitchen.. 1 Tab By Mouth Once Daily 8)  Lisinopril-Hydrochlorothiazide 20-25 Mg Tabs (Lisinopril-Hydrochlorothiazide) .... One Half Tab By Mouth Daily 9)  Aspir-81 81 Mg Tbec (Aspirin) .... Once Daily 10)  Niaspan 1000 Mg Cr-Tabs (Niacin (Antihyperlipidemic)) .... One Tab By Mouth Qhs 11)  Ropinirole Hcl 2 Mg Tabs (Ropinirole Hcl) .... One Tab By Mouth Tid 12)  Oxycodone Hcl 5 Mg Tabs (Oxycodone Hcl) .... One Tab By Mouth Q8h As Needed For Pain 13)  Nasonex 50 Mcg/act  Susp (Mometasone Furoate) .... 2 Sprays in Each Nostril Two Times A Day. 14)  Oxybutynin Chloride 5 Mg Tabs (Oxybutynin Chloride) .... One Tab By Mouth Two Times A Day 15)  Nystatin 100000 Unit/ml Susp (Nystatin) .... 6ml By Mouth, Swish, Keep in Mouth As Long As Possible, Then Swallow.  Do This 4x/day.  Continue At Least 48h After Symptoms Resolve. 16)  Antivert 25 Mg Tabs (Meclizine Hcl) .... One Tab By Mouth Q8h As Needed For Vertigo. 17)  Metoprolol Tartrate 50 Mg Tabs (Metoprolol Tartrate) .... One Tab By Mouth Bid 18)  Demadex 10 Mg Tabs (Torsemide) .... One Tab By Mouth Daily 19)  Folic Acid 1 Mg Tabs (Folic Acid) .... One Tab By Mouth On Days Not Taking Methotrexate. 20)  Potassium Chloride Crys Cr 20 Meq Cr-Tabs (Potassium Chloride Crys Cr) .... One Tab By Mouth Daily  Allergies (verified): 1)  ! Pcn 2)  ! Codeine 3)  ! Wellbutrin 4)  ! * Zyban 5)  ! Cipro 6)  ! Flagyl 7)  ! * Ppd 8)  ! * Tetanus 9)  ! * Iv Ig 10)  ! * Statins 11)  ! Lexapro (Escitalopram Oxalate) 12)  ! Paxil (Paroxetine Hcl) 13)  ! Prozac 14)  ! Advair Diskus (Fluticasone-Salmeterol) 15)  !  Protopic (Tacrolimus) 16)  ! Temovate E (Clobetasol Prop Emollient Base) 17)  ! Betamethasone Dipropionate (Betamethasone Dipropionate) 18)  ! 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) FIBROMYALGIA (ICD-729.1) VERTIGO (ICD-780.4) TREMOR, RIGHT HAND (ICD-781.0) SPECIAL SCREENING MALIGNANT NEOPLASM OF PROSTATE (ICD-V76.44) GASTRITIS, HX OF  (ICD-V12.79) HEMORRHOIDS, INTERNAL (ICD-455.0) DIVERTICULITIS, HX OF (ICD-V12.79) ANXIETY, CHRONIC (ICD-300.00) DIVERTICULOSIS OF COLON (ICD-562.10) COLONIC POLYPS, ADENOMATOUS, HX OF (ICD-V12.72) NEPHROLITHIASIS, HX OF (ICD-V13.01) ANEMIA, HX OF (ICD-V12.3) Hx of ESOPHAGEAL STRICTURE (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 Lungs are clear to auscultation, no crackles or wheezes. Heart:  Normal rate and regular rhythm. S1 and S2 normal without gallop, murmur, click, rub or other extra sounds. Extremities:  No clubbing, cyanosis, edema, or deformity noted with normal full range of motion of all joints.     Impression & Recommendations:  Problem # 1:  HYPOKALEMIA, MILD (ICD-276.8) Assessment New Taking KCl 20 meq by mouth once daily well.  Recheck BMEt today.  Orders: Basic Met-[facility] ([id number]) [facility]- Est  Level 4 (86578)  Problem # 2:  ACCIDENTAL FALL (ICD-E888.9) Assessment: Improved Symptoms improved.  Will cancel orders for XR.  Orders: [facility]- Est  Level 4 (46962)  Problem # 3:  ISCHEMIC HEART DISEASE (ICD-414.9) Assessment: Unchanged Still needs ECHO.  Rescheduled.  His updated medication list for this problem includes:    Nitroglycerin 0.4 Mg Subl (Nitroglycerin) .Marland Kitchen... As needed    Lisinopril-hydrochlorothiazide 20-25 Mg Tabs (Lisinopril-hydrochlorothiazide) ..... One half  tab by mouth daily    Aspir-81 81 Mg Tbec (Aspirin) ..... Once daily    Metoprolol Tartrate 50 Mg Tabs (Metoprolol tartrate) ..... One tab by mouth bid    Demadex 10 Mg Tabs (Torsemide) ..... One tab by mouth daily  Orders: 2 D Echo (2 D Echo) [facility]- Est  Level 4 (95284)  Complete Medication List: 1)  Nexium 40 Mg Cpdr (Esomeprazole magnesium) .... Take 1 tablet by mouth once a day 2)  Nitroglycerin 0.4 Mg Subl (Nitroglycerin) .... As needed 3)  Methotrexate 2.5 Mg Tabs (Methotrexate sodium) .... 3 tabs by mouth q weekly 4)  Alprazolam 1 Mg Tbdp (Alprazolam) .Marland Kitchen.. 1 tablet three times a day 5)  Cymbalta 60 Mg Cpep (Duloxetine hcl) .Marland Kitchen.. 1 capsule two times a day 6)  Glucotrol Xl 5 Mg Xr24h-tab (Glipizide) .Marland Kitchen.. 1 tablet by mouth once daily 7)  Prednisone 5 Mg Tabs (Prednisone) .Marland Kitchen.. 1 tab by mouth once daily 8)  Lisinopril-hydrochlorothiazide 20-25 Mg Tabs (Lisinopril-hydrochlorothiazide) .... One half tab by mouth daily 9)  Aspir-81 81 Mg Tbec (Aspirin) .... Once daily 10)  Niaspan 1000 Mg Cr-tabs (Niacin (antihyperlipidemic)) .... One tab by mouth qhs 11)  Ropinirole Hcl 2 Mg Tabs (Ropinirole hcl) .... One tab by mouth tid 12)  Oxycodone  Hcl 5 Mg Tabs (Oxycodone hcl) .... One tab by mouth q8h as needed for pain 13)  Nasonex 50 Mcg/act Susp (Mometasone furoate) .... 2 sprays in each nostril two times a day. 14)  Oxybutynin Chloride 5 Mg Tabs (Oxybutynin chloride) .... One tab by mouth two times a day 15)  Nystatin 100000 Unit/ml Susp (Nystatin) .... 6ml by mouth, swish, keep in mouth as long as possible, then swallow.  do this 4x/day.  continue at least 48h after symptoms resolve. 16)  Antivert 25 Mg Tabs (Meclizine hcl) .... One tab by mouth q8h as needed for vertigo. 17)  Metoprolol Tartrate 50 Mg Tabs (Metoprolol tartrate) .... One tab by mouth bid 18)  Demadex 10 Mg Tabs (Torsemide) .... One tab by mouth daily 19)  Folic Acid 1 Mg Tabs (Folic acid) .... One tab by mouth on days not taking  methotrexate. 20)  Potassium Chloride Crys Cr 20 Meq Cr-tabs (Potassium chloride crys cr) .... One tab by mouth daily  Patient Instructions: 1)  Lab to recheck potassium 2)  Stop oxybutinin for now, this will improve your dry mouth. 3)  ECHO appt is April 14th at 3pm at the hospital.  Be there! 4)  Come back to see me in 6 months or sooner if needed. 5)  -Dr. Karie Schwalbe.   Last HGBA1C:  5.5 (05/13/2009 4:03:54 PM) HGBA1C Next Due:  6 mo

## 2010-03-19 NOTE — Assessment & Plan Note (Signed)
 Summary: remove skin lesion/kh   Vital Signs:  Patient profile:   60 year old male Temp:     97.1 degrees F Pulse rate:   99 / minute BP sitting:   94 / 60  (right arm)  Vitals Entered By: Donzell Ip RN (August 16, 2008 2:36 PM) CC: lesion removal Is Patient Diabetic? Yes  Pain Assessment Patient in pain? no        Primary Care Provider:  Debby Petties, MD  CC:  lesion removal.  History of Present Illness: 26M with Dermatomyositis, tremor, lesion on left nipple comes in for biopsy of skin lesion.  Skin lesion as on prior note: Present weeks to months, just medial to left nipple, has recently become painful.  No other lesions of similar nature.  Allergies: 1)  ! Pcn 2)  ! Codeine 3)  ! Wellbutrin 4)  ! * Zyban 5)  ! Cipro 6)  ! Flagyl 7)  ! * Ppd 8)  ! * Tetanus 9)  ! * Iv Ig 10)  ! * Statins 11)  ! Lexapro (Escitalopram Oxalate) 12)  ! Paxil (Paroxetine Hcl) 13)  ! Prozac (Fluoxetine Hcl) 14)  ! Advair Diskus (Fluticasone -Salmeterol) 15)  ! Protopic (Tacrolimus) 16)  ! Temovate E (Clobetasol Prop Emollient Base) 17)  ! 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) FIBROMYALGIA (ICD-729.1) VERTIGO (ICD-780.4) TREMOR, RIGHT HAND (ICD-781.0) SPECIAL SCREENING MALIGNANT NEOPLASM OF PROSTATE (ICD-V76.44) GASTRITIS, HX OF (ICD-V12.79) HEMORRHOIDS, INTERNAL (ICD-455.0) DIVERTICULITIS, HX OF (ICD-V12.79) ANXIETY, CHRONIC (ICD-300.00) DIVERTICULOSIS OF COLON (ICD-562.10) COLONIC POLYPS, ADENOMATOUS, HX OF (ICD-V12.72) NEPHROLITHIASIS, HX OF  (ICD-V13.01) ANEMIA, HX OF (ICD-V12.3) Hx of ESOPHAGEAL STRICTURE (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) DERMATOMYOSITIS (ICD-710.3) ARTHRITIS (ICD-716.90) RHINITIS (ICD-472.0) GENERALIZED PAIN (ICD-780.96)   DERMATOMYOSITIS (ICD-710.3) ARTHRITIS (ICD-716.90) GERD (ICD-530.81) HYPERTENSION (ICD-401.9) HYPERLIPIDEMIA (ICD-272.4) Hx MI 1999, 2000, 2004  Past Surgical History: Last updated: 01/26/2008 knee surgery for meniscal tear lumbar diskectomy  Review of Systems       See HPI  Physical Exam  General:  Well-developed,well-nourished,in no acute distress; alert,appropriate and cooperative throughout examination Chest Wall:  1cm lesion, appears somewhat like a hypertrophic scar, located just medial to left nipple, TTP, non-erythematous. Lungs:  Normal respiratory effort, chest expands symmetrically. Lungs are clear to auscultation, no crackles or wheezes. Heart:  Normal rate and regular rhythm. S1 and S2 normal without gallop, murmur, click, rub or other extra sounds. Additional Exam:  Procedure:  Punch biopsy of skin lesion. Surgeon: Petties  Consent obtained, pt understands risks/benefits of procedure, time out conducted.   ~5cc 1% lidocaine  without epi instilled around lesion and under lesion as a field block.  Area prepped with betadine and draped in a sterile fashion.  4mm punch biopsy taken at edge of lesion obtaining some of lesion and some of normal skin.  Placed in formalin and sent for pathologic diagnosis.  4-0 Ethilon suture placed in skin opposing edges of wound.  Hemostasis achieved.  Triple antibiotic ointment placed on skin and gauze placed with tegaderm on top.  Pt cleaned and left room in stable condition.   Impression & Recommendations:  Problem # 1:  SKIN LESION (ICD-709.9) Assessment New Punch biopsy done today.  Will f/u pathologic diagnosis.  Pt instructed to RTC in  7 days for removal of suture.  Ok to double book  appts.  Orders: Punch Biopsy- [facility] ([id number])  Problem # 2:  TREMOR, RIGHT HAND (ICD-781.0) Assessment: Improved Pt states tremor has completely improved with addition of Ropinirole .  Complete Medication List: 1)  Nexium  40 Mg Cpdr (Esomeprazole  magnesium ) .... Take 1 tablet by mouth once a day 2)  Nitroglycerin  0.4 Mg Subl (Nitroglycerin ) .... As needed 3)  Methotrexate  2.5 Mg Tabs (Methotrexate  sodium) .... 5 tabs by mouth q weekly 4)  Oxycodone -acetaminophen  7.5-325 Mg Tabs (Oxycodone -acetaminophen ) .SABRA.. 1 tablet two times a day 5)  Alprazolam  1 Mg Tbdp (Alprazolam ) .SABRA.. 1 tablet three times a day 6)  Cymbalta  60 Mg Cpep (Duloxetine  hcl) .SABRA.. 1 capsule two times a day 7)  Glucotrol  Xl 5 Mg Xr24h-tab (Glipizide ) .SABRA.. 1 tablet by mouth once daily 8)  Prednisone  5 Mg Tabs (Prednisone ) .... 2 tablets by mouth once daily 9)  Lisinopril -hydrochlorothiazide  20-25 Mg Tabs (Lisinopril -hydrochlorothiazide ) .... Once daily 10)  Aspir-81 81 Mg Tbec (Aspirin ) .... Once daily 11)  Afrin Nodrip Severe Congest 0.05 % Soln (Oxymetazoline hcl) .... One spray in each nostril for nasal congestion and runny nose. 12)  Niacin  Flush Free 500 Mg Caps (Inositol niacinate) .... One tab by mouth qhs x4 weeks, then 2 tabs by mouth qhs x 4 more weeks. 13)  Ropinirole  Hcl 0.25 Mg Tabs (Ropinirole  hcl) .... One tab by mouth tid for one week, then 2 tabs by mouth tid for a week, then 3 tabs by mouth tid x1wk, then 4 tabs by mouth tid 14)  Oxycodone  Hcl 5 Mg Tabs (Oxycodone  hcl) .... One tab by mouth q6h as needed for pain  Other Orders: A1C-[facility] ([id number])  Patient Instructions: 1)  Good to see you today,  2)  We took a biopsy of your skin lesion.  I want you to come back in one week to see me to take out your stitch.  Ok to double book appointments.   3)  -Dr. ONEIDA.  Laboratory Results   Blood Tests   Date/Time Received: August 16, 2008 2:06 PM  Date/Time Reported: August 16, 2008 2:17 PM   HGBA1C: 5.7%   (Normal  Range: Non-Diabetic - 3-6%   Control Diabetic - 6-8%)  Comments: ...........test performed by...........SABRAArland Morel, CMA

## 2010-03-19 NOTE — Progress Notes (Signed)
 Summary: phn msg  Phone Note Call from Patient   Caller: Patient Summary of Call: pt could not come today due to car trouble. Initial call taken by: Madelin Daring,  August 26, 2008 3:32 PM  Follow-up for Phone Call        He still has stitches in that NEED to be removed.  He needs to either come as a work in, come to see me tomorrow, or wed with the understanding that we won't be discussing anything BUT removal or the stitches, or go to the [facility] or ED to have the stitches removed.  If he agrees to see me tomorrow or Wed and only discuss stitches then ok to double book.  Either way he needs to see a provider.  Thanks. Follow-up by: Debby Petties MD,  August 26, 2008 4:35 PM

## 2010-03-19 NOTE — Assessment & Plan Note (Signed)
Summary: several issues/ls   Vital Signs:  Patient profile:   60 year old male Weight:      228.7 pounds Temp:     98.4 degrees F oral Pulse rate:   84 / minute Pulse rhythm:   regular BP sitting:   88 / 56  (left arm) Cuff size:   large  Vitals Entered By: Loralee Pacas CMA (Jul 03, 2009 4:09 PM)  Primary Care Provider:  Rodney Langton, MD   History of Present Illness: 84M here for fu of Dermatomyositis.  DM:  Followed by Dr. Kellie Simmering, wants some labwork checked and forwarded to him.  Symptoms worsening since backing off prednisone and MTX.  Hands swollen and sore.  HTN:  BP low today, asymptomatic.  Swelling: Needs refill on potassium.  Demadex was helping swelling, ECHO normal.  Current Medications (verified): 1)  Nexium 40 Mg Cpdr (Esomeprazole Magnesium) .... Take 1 Tablet By Mouth Once A Day 2)  Nitroglycerin 0.4 Mg Subl (Nitroglycerin) .... As Needed 3)  Methotrexate 2.5 Mg Tabs (Methotrexate Sodium) .... 3 Tabs By Mouth Q Weekly 4)  Alprazolam 1 Mg Tbdp (Alprazolam) .Marland Kitchen.. 1 Tablet Three Times A Day 5)  Cymbalta 60 Mg Cpep (Duloxetine Hcl) .Marland Kitchen.. 1 Capsule Two Times A Day 6)  Glucotrol Xl 5 Mg Xr24h-Tab (Glipizide) .Marland Kitchen.. 1 Tablet By Mouth Once Daily 7)  Prednisone 5 Mg Tabs (Prednisone) .Marland Kitchen.. 1 Tab By Mouth Once Daily 8)  Lisinopril-Hydrochlorothiazide 20-25 Mg Tabs (Lisinopril-Hydrochlorothiazide) .... One Half Tab By Mouth Daily 9)  Aspir-81 81 Mg Tbec (Aspirin) .... Once Daily 10)  Niaspan 1000 Mg Cr-Tabs (Niacin (Antihyperlipidemic)) .... One Tab By Mouth Qhs 11)  Ropinirole Hcl 2 Mg Tabs (Ropinirole Hcl) .... One Tab By Mouth Tid 12)  Oxycodone Hcl 5 Mg Tabs (Oxycodone Hcl) .... One Tab By Mouth Q8h As Needed For Pain 13)  Nasonex 50 Mcg/act Susp (Mometasone Furoate) .... 2 Sprays in Each Nostril Two Times A Day. 14)  Oxybutynin Chloride 5 Mg Tabs (Oxybutynin Chloride) .... One Tab By Mouth Two Times A Day 15)  Nystatin 100000 Unit/ml Susp (Nystatin) .... 6ml By  Mouth, Swish, Keep in Mouth As Long As Possible, Then Swallow.  Do This 4x/day.  Continue At Least 48h After Symptoms Resolve. 16)  Antivert 25 Mg Tabs (Meclizine Hcl) .... One Tab By Mouth Q8h As Needed For Vertigo. 17)  Metoprolol Tartrate 50 Mg Tabs (Metoprolol Tartrate) .... One Tab By Mouth Bid 18)  Demadex 10 Mg Tabs (Torsemide) .... One Tab By Mouth Daily 19)  Folic Acid 1 Mg Tabs (Folic Acid) .... One Tab By Mouth On Days Not Taking Methotrexate. 20)  Potassium Chloride 40 Meq/47ml (20%) Liqd (Potassium Chloride) .... Take 15ml By Mouth Daily  Allergies (verified): 1)  ! Pcn 2)  ! Codeine 3)  ! Wellbutrin 4)  ! * Zyban 5)  ! Cipro 6)  ! Flagyl 7)  ! * Ppd 8)  ! * Tetanus 9)  ! * Iv Ig 10)  ! * Statins 11)  ! Lexapro (Escitalopram Oxalate) 12)  ! Paxil (Paroxetine Hcl) 13)  ! Prozac 14)  ! Advair Diskus (Fluticasone-Salmeterol) 15)  ! Protopic (Tacrolimus) 16)  ! Temovate E (Clobetasol Prop Emollient Base) 17)  ! Betamethasone Dipropionate (Betamethasone Dipropionate) 18)  ! Lasix  Review of Systems       See HPI  Physical Exam  General:  Well-developed,well-nourished,in no acute distress; alert,appropriate and cooperative throughout examination Lungs:  Normal respiratory effort, chest expands symmetrically. Lungs  are clear to auscultation, no crackles or wheezes. Heart:  Normal rate and regular rhythm. S1 and S2 normal without gallop, murmur, click, rub or other extra sounds. Abdomen:  Bowel sounds positive,abdomen soft and non-tender without masses, organomegaly or hernias noted. Extremities:  2+ edema in both LE.  hands red, tender, swollen.   Impression & Recommendations:  Problem # 1:  DERMATOMYOSITIS (ICD-710.3) Assessment Deteriorated Will forward the below labs to Dr. Kellie Simmering.  Solumedrol IM now for flare of symptoms.  Orders: Comp Met-[facility] [phone]) CBC-[facility] [phone]) CK (Creatine Kinase)-[facility] [id number]) Uric Acid-[facility] ([id number]) Sed Rate  (ESR)-[facility] (85651) LDH-[facility] (84132) Miscellaneous Lab Charge-[facility] (44010) [facility]- Est  Level 4 (27253)  Problem # 2:  HYPERTENSION (ICD-401.9) Assessment: Unchanged BP somewhat low, HOLDING lisinopril/HCTZ for now, pt to RTC next week for BP check.  His updated medication list for this problem includes:    Lisinopril-hydrochlorothiazide 20-25 Mg Tabs (Lisinopril-hydrochlorothiazide) ..... One half tab by mouth daily    Metoprolol Tartrate 50 Mg Tabs (Metoprolol tartrate) ..... One tab by mouth bid    Demadex 10 Mg Tabs (Torsemide) ..... One tab by mouth daily  Orders: [facility]- Est  Level 4 (99214)  Problem # 3:  HYPOKALEMIA, MILD (ICD-276.8) Assessment: Improved resolved with kdur.  Will change to potassium 40 meq syrup.  QS 90 days.  Problem # 4:  GENERALIZED PAIN (ICD-780.96) Assessment: Unchanged Pt agrees to wait till end of month to refill oxycodone.  #180 tabs last him 2 months.  Orders: [facility]- Est  Level 4 (99214)  Complete Medication List: 1)  Nexium 40 Mg Cpdr (Esomeprazole magnesium) .... Take 1 tablet by mouth once a day 2)  Nitroglycerin 0.4 Mg Subl (Nitroglycerin) .... As needed 3)  Methotrexate 2.5 Mg Tabs (Methotrexate sodium) .... 3 tabs by mouth q weekly 4)  Alprazolam 1 Mg Tbdp (Alprazolam) .Marland Kitchen.. 1 tablet three times a day 5)  Cymbalta 60 Mg Cpep (Duloxetine hcl) .Marland Kitchen.. 1 capsule two times a day 6)  Glucotrol Xl 5 Mg Xr24h-tab (Glipizide) .Marland Kitchen.. 1 tablet by mouth once daily 7)  Prednisone 5 Mg Tabs (Prednisone) .Marland Kitchen.. 1 tab by mouth once daily 8)  Lisinopril-hydrochlorothiazide 20-25 Mg Tabs (Lisinopril-hydrochlorothiazide) .... One half tab by mouth daily 9)  Aspir-81 81 Mg Tbec (Aspirin) .... Once daily 10)  Niaspan 1000 Mg Cr-tabs (Niacin (antihyperlipidemic)) .... One tab by mouth qhs 11)  Ropinirole Hcl 2 Mg Tabs (Ropinirole hcl) .... One tab by mouth tid 12)  Oxycodone Hcl 5 Mg Tabs (Oxycodone hcl) .... One tab by mouth q8h as needed for pain 13)  Nasonex 50 Mcg/act Susp  (Mometasone furoate) .... 2 sprays in each nostril two times a day. 14)  Oxybutynin Chloride 5 Mg Tabs (Oxybutynin chloride) .... One tab by mouth two times a day 15)  Nystatin 100000 Unit/ml Susp (Nystatin) .... 6ml by mouth, swish, keep in mouth as long as possible, then swallow.  do this 4x/day.  continue at least 48h after symptoms resolve. 16)  Antivert 25 Mg Tabs (Meclizine hcl) .... One tab by mouth q8h as needed for vertigo. 17)  Metoprolol Tartrate 50 Mg Tabs (Metoprolol tartrate) .... One tab by mouth bid 18)  Demadex 10 Mg Tabs (Torsemide) .... One tab by mouth daily 19)  Folic Acid 1 Mg Tabs (Folic acid) .... One tab by mouth on days not taking methotrexate. 20)  Potassium Chloride 40 Meq/48ml (20%) Liqd (Potassium chloride) .... Take 15ml by mouth daily  Other Orders: Solumedrol up to 125mg  ([id number])  Patient Instructions:  1)  Change potassium to 40 meq syrup daily 2)  HOLD lisinopril/hctz for now 3)  Labs today, will forward to Dr. Kellie Simmering. 4)  Solumedrol shot 5)  come back in one week for a nurse BP check. 6)  Call me near the end of the month and I will refill your oxycodone then. 7)  -Dr. Karie Schwalbe. Prescriptions: POTASSIUM CHLORIDE 40 MEQ/15ML (20%) LIQD (POTASSIUM CHLORIDE) Take 15mL by mouth daily  #3 months x 6   Entered and Authorized by:   Rodney Langton MD   Signed by:   Rodney Langton MD on 07/03/2009   Method used:   Electronically to        [facility]* (retail)       [address]       [place], [place]  [id number]       Ph: [phone]       Fax: [phone]   RxID:   [id number]    Medication Administration  Injection # 1:    Medication: Solumedrol up to 125mg     Diagnosis: ACCIDENTAL FALL (ICD-E888.9)    Route: IM    Site: R deltoid    Exp Date: 12/17/2011    Lot #: [id number]    Mfr: pfizer    Patient tolerated injection without complications    Given by: Loralee Pacas CMA (Jul 03, 2009 5:01 PM)  Orders Added: 1)  Comp  Met-[facility] [[id number]] 2)  CBC-[facility] [85027] 3)  CK (Creatine Kinase)-[facility] [82550-23250] 4)  Uric Acid-[facility] [84550-23180] 5)  Sed Rate (ESR)-[facility] [85651] 6)  LDH-[facility] [83615] 7)  Miscellaneous Lab Charge-[facility] [99999] 8)  [facility]- Est  Level 4 [99214] 9)  Solumedrol up to 125mg  [[id number]]

## 2010-03-19 NOTE — Miscellaneous (Signed)
Summary: ROI  ROI   Imported By: Clydell Hakim 07/17/2009 16:29:40  _____________________________________________________________________  External Attachment:    Type:   Image     Comment:   External Document

## 2010-03-19 NOTE — Miscellaneous (Signed)
Summary: Re: Home health referral  Clinical Lists Changes called [facility] about referral and was told that they cannot provide services for light cleaning , etc. if patient requires skilled nursing services this is where they would be able to help. will forward to MD to advise. Marland Kitchen  Theresia Lo RN  September 04, 2009 9:18 AM  Noted, lets just let pt know, he refused SNF so this is not an option. Rodney Langton MD  September 04, 2009 9:21 AM    patient ntoified. Theresia Lo RN  September 04, 2009 10:57 AM

## 2010-03-19 NOTE — Progress Notes (Signed)
Summary: pls call  Phone Note Call from Patient Call back at Home Phone [phone]   Caller: Patient Summary of Call: wants to talk to administrator about his missing appointment  states he just got out of hospital Initial call taken by: De Nurse,  November 20, 2009 1:34 PM  Follow-up for Phone Call        I see no evidence that patient was hospitalized (last note is from June) I will not call him back. This has been a pattern (no shows) of his.  Probation letter to be mailed today. Follow-up by: Dennison Nancy RN,  November 20, 2009 2:48 PM

## 2010-03-19 NOTE — Assessment & Plan Note (Signed)
 Summary: FU/KH   Vital Signs:  Patient profile:   60 year old male Temp:     98.0 degrees F oral Pulse rate:   98 / minute BP sitting:   112 / 79  (right arm)  Vitals Entered By: Letitia Reusing (Jun 18, 2008 1:34 PM)  CC: f/u Is Patient Diabetic? Yes   History of Present Illness: 87M with MMP here for follow up of HLD, Tremor, rhinorrhea.  HLD:  Elevated Triglycerides, LDL, low HDL.  Actually not taking zetia, as [employer] no longer pays for this, allergic to all statins.  Tremor:  Still present.  Present both at rest and on intention, approx 4 hz, sometimes drops things, very concerning for pt.  Pt has had a recent MRI brain that showed subcortical white matter disease but no acute infarct or other abnormalities.  Pt also has somewhat dysarthric speech that is very slow and deliberate.  This is not new.  No tremor in head/neck.  No subjective asymmetric weakness, however he does complain of overall weakness.   Season Nasal congestion:  Resolved with afrin nasal spray.  Chronic Pain:  Pt still asking me for oxycodone .    Habits & Providers     Tobacco Status: current  Allergies: 1)  ! Pcn 2)  ! Codeine 3)  ! Wellbutrin 4)  ! * Zyban 5)  ! Cipro 6)  ! Flagyl 7)  ! * Ppd 8)  ! * Tetanus 9)  ! * Iv Ig 10)  ! * Statins 11)  ! Lexapro (Escitalopram Oxalate) 12)  ! Paxil (Paroxetine Hcl) 13)  ! Prozac (Fluoxetine Hcl) 14)  ! Advair Diskus (Fluticasone -Salmeterol) 15)  ! Protopic (Tacrolimus) 16)  ! Temovate E (Clobetasol Prop Emollient Base) 17)  ! 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     Alcohol  Use - yes red wine everyday    Illicit Drug Use - no    Pt lives alone.  No help at home.  Does ADLs himself. (05/31/2008)  Review of Systems       See HPI  Physical Exam  General:  Well-developed,well-nourished,in no acute distress; alert,appropriate and cooperative throughout examination Mouth:  Oral mucosa and oropharynx without lesions or exudates.  Lungs:  Normal respiratory effort, chest expands symmetrically. Lungs are clear to auscultation, no  crackles or wheezes. Heart:  Normal rate and regular rhythm. S1 and S2 normal without gallop, murmur, click, rub or other extra sounds. Abdomen:  Bowel sounds positive,abdomen soft and non-tender without masses, organomegaly or hernias noted.   Impression & Recommendations:  Problem # 1:  RHINITIS (ICD-472.0) Assessment Improved This has resolved.  Orders: [facility]- Est  Level 4 (00785)  Problem # 2:  HYPERLIPIDEMIA (ICD-272.4) Assessment: Deteriorated Pt is not actually taking zetia.  Unable to use statins.  As major issue is his triglycerides, will start niacin .  This can also help increase HDL.  Will start at 500 x4weeks, then increase to 1000 x4 weeks to minimize flushing.  Recheck lipid panel in 8 weeks.    The following  medications were removed from the medication list:    Zetia 10 Mg Tabs (Ezetimibe) .SABRA... 1 tab by mouth daily  Orders: [facility]- Est  Level 4 (99214)  Problem # 3:  TREMOR, RIGHT HAND (ICD-781.0) Assessment: Unchanged Considering essential tremor.  Will try propranolol 60 three times a day.  Reassess at next visit in 4 weeks.  Pt's pulse was in the 90's so there is room to titrate.  If no improvement after appropriate trial of propranolol, can try primidone.  If still no improvement, will refer to Neurology.  Orders: [facility]- Est  Level 4 (00785)  Problem # 4:  GENERALIZED PAIN (ICD-780.96) Assessment: Unchanged Will not give oxycodone  until pt can prove that he follows up with me regularly.  Will have him sign a pain contract if he makes next appt.  Orders: [facility]- Est  Level 4 (00785)  Problem # 5:  DIABETES MELLITUS, TYPE II (ICD-250.00) Assessment: Improved Well controlled, no changes.  His updated medication list for this problem includes:    Glucotrol  Xl 5 Mg Xr24h-tab (Glipizide ) .SABRA... 1 tablet by mouth once daily    Lisinopril -hydrochlorothiazide  20-25 Mg Tabs (Lisinopril -hydrochlorothiazide ) ..... Once daily    Aspir-81 81 Mg Tbec (Aspirin ) ..... Once daily  Orders: [facility]- Est  Level 4 (00785)  Problem # 6:  HYPERTENSION (ICD-401.9) Assessment: Improved Well controlled, no changes needed.  His updated medication list for this problem includes:    Lisinopril -hydrochlorothiazide  20-25 Mg Tabs (Lisinopril -hydrochlorothiazide ) ..... Once daily    Propranolol Hcl 60 Mg Tabs (Propranolol hcl) ..... One tab by mouth tid  Orders: [facility]- Est  Level 4 (99214)  Problem # 7:  SPECIAL SCREENING MALIGNANT NEOPLASM OF PROSTATE (ICD-V76.44) PSA WNL.  Orders: [facility]- Est  Level 4 (99214)  Complete Medication List: 1)  Nexium  40 Mg Cpdr (Esomeprazole  magnesium ) .... Take 1 tablet by mouth once a day 2)  Nitroglycerin  0.4 Mg Subl (Nitroglycerin ) .... As needed 3)  Methotrexate  2.5 Mg Tabs  (Methotrexate  sodium) .... 5 tabs by mouth q weekly 4)  Oxycodone -acetaminophen  7.5-325 Mg Tabs (Oxycodone -acetaminophen ) .SABRA.. 1 tablet two times a day 5)  Alprazolam  1 Mg Tbdp (Alprazolam ) .SABRA.. 1 tablet three times a day 6)  Cymbalta  60 Mg Cpep (Duloxetine  hcl) .SABRA.. 1 capsule two times a day 7)  Glucotrol  Xl 5 Mg Xr24h-tab (Glipizide ) .SABRA.. 1 tablet by mouth once daily 8)  Prednisone  5 Mg Tabs (Prednisone ) .... 2 tablets by mouth once daily 9)  Lisinopril -hydrochlorothiazide  20-25 Mg Tabs (Lisinopril -hydrochlorothiazide ) .... Once daily 10)  Aspir-81 81 Mg Tbec (Aspirin ) .... Once daily 11)  Afrin Nodrip Severe Congest 0.05 % Soln (Oxymetazoline hcl) .... One spray in each nostril for nasal congestion and runny nose. 12)  Propranolol Hcl 60 Mg Tabs (Propranolol hcl) .... One tab  by mouth tid 13)  Niacin  Flush Free 500 Mg Caps (Inositol niacinate) .... One tab by mouth qhs x4 weeks, then 2 tabs by mouth qhs x 4 more weeks.  Patient Instructions: 1)  Good to see you. 2)  I still want to see you one more time before I start prescribing narcotics. 3)  I will start you on low dose propranolol for your hand tremor.  60mg  three times a day. 4)  I will also start Niacin  (Vitamin B3), this will help with lowering your triglycerides, and will increase your good cholesterol.  Start with 500mg  at bedtime for 4 weeks, then increase to two 500 mg tabs (1000mg ) at bedtime, then I will recheck your lipids. 5)  Come back to see me in four weeks to reassess your tremor, and then 4 weeks after that to recheck your cholesterol and adjust your cholesterol meds. 6)  -Dr. ONEIDA. Prescriptions: NIACIN  FLUSH FREE 500 MG CAPS (INOSITOL NIACINATE) One tab by mouth qHS x4 weeks, then 2 tabs by mouth qHS x 4 more weeks.  #90 x 1   Entered and Authorized by:   Debby Petties MD   Signed by:   Debby Petties MD on 06/18/2008   Method used:   Electronically to        [facility]* (retail)       [address]       [place], [place]  [address]       Ph: [phone]       Fax: [phone]   RxID:   [id number] PROPRANOLOL HCL 60 MG TABS (PROPRANOLOL HCL) One tab by mouth TID  #90 x 2   Entered and Authorized by:   Debby Petties MD   Signed by:   Debby Petties MD on 06/18/2008   Method used:   Electronically to        [facility]* (retail)       [address]       [place], [place]  [address]       Ph: [phone]       Fax: [phone]   RxID:   [id number]

## 2010-03-19 NOTE — Miscellaneous (Signed)
 Summary: Procedure Consent  Procedure Consent   Imported By: Karna Seminole 08/21/2008 16:15:30  _____________________________________________________________________  External Attachment:    Type:   Image     Comment:   External Document

## 2010-03-19 NOTE — Progress Notes (Signed)
Summary: phn msg  Phone Note Call from Patient   Caller: Patient Summary of Call: wants to know if nurse has heard from Truslow's office.   Initial call taken by: De Nurse,  April 09, 2009 9:37 AM  Follow-up for Phone Call        patient notified that RN did contact Dr. Ines Bloomer office yesterday and they will send office note when Dr.Truslow is finished with his dictation.  Follow-up by: Theresia Lo RN,  April 09, 2009 10:43 AM

## 2010-03-19 NOTE — Letter (Signed)
Summary: Call a Nurse  Call a Nurse   Imported By: Lanelle Bal 11/25/2009 10:44:32  _____________________________________________________________________  External Attachment:    Type:   Image     Comment:   External Document

## 2010-03-19 NOTE — Assessment & Plan Note (Signed)
Summary: BP CHECK/KH  Nurse Visit  patient in office today because he  thought he had been scheduled an appointment with Dr. Ilsa Iha.    BP checked 120/78 pulse 96 today . he brings in papers from [employer] that MD will need to fillout . he has signed a  release of information form so information can be given to  them.   RN spent 40 minutes with patient  during this visit. He is quite concerned about the liquid potassium that has been prescribed. States he has much nausea and has to  "hug the commode  " when he takes it. He has not gotten the rx for phenergan that was sent in for him yet but plans to get it today. He doesn't understand why MD changed to the liquid form because he had no problem swallowing the big tablets of potassium.  Also he has a concern about the folic acid that he was instructed to take . He states a pharmacist contacted Dr. Benjamin Stain about the folic acid reacting with some of his meds , part.  the methotrexate. States  now he has been taken off the methotrexate by rheumatologist and wants to make sure he should  continue the folic acid.   Advised patient this message will be given to MD . Sent to lab for ordered bloodwork . Patient also wanted a follow up appointment with Dr. Benjamin Stain  and this is scheduled for 08/04/2009.Theresia Lo RN  Jul 10, 2009 2:26 PM'  Allergies: 1)  ! Pcn 2)  ! Codeine 3)  ! Wellbutrin 4)  ! * Zyban 5)  ! Cipro 6)  ! Flagyl 7)  ! * Ppd 8)  ! * Tetanus 9)  ! * Iv Ig 10)  ! * Statins 11)  ! Lexapro (Escitalopram Oxalate) 12)  ! Paxil (Paroxetine Hcl) 13)  ! Prozac 14)  ! Advair Diskus (Fluticasone-Salmeterol) 15)  ! Protopic (Tacrolimus) 16)  ! Temovate E (Clobetasol Prop Emollient Base) 17)  ! Betamethasone Dipropionate (Betamethasone Dipropionate) 18)  ! Lasix  Orders Added: 1)  No Charge Patient Arrived (NCPA0) [NCPA0]  Appended Document: BP CHECK/KH Hi Lynn, thanks for seeing him, if he really wants the large pills then thats  ok.  Also he should continue the folic acid.  Will fill out forms at next visit. Rodney Langton MD  Jul 10, 2009 10:28 PM   Clinical Lists Changes  Medications: Changed medication from POTASSIUM CHLORIDE 40 MEQ/15ML (20%) LIQD (POTASSIUM CHLORIDE) Take 15mL by mouth daily to POTASSIUM CHLORIDE CRYS CR 20 MEQ CR-TABS (POTASSIUM CHLORIDE CRYS CR) Two tabs by mouth daily      Appended Document: BP CHECK/KH message left on patient 's  voicemail to return call. Theresia Lo RN  Jul 11, 2009 3:00 PM   patient notified to stop liquid potassium and advised that MD will send in RX for potassium tablets. advised to take folic acid also. also advised that MD will fill out [facility] form at next office visit. Theresia Lo RN  Jul 11, 2009 4:58 PM  rx called to pharmacy for potassium 20 meq  tabs as noted on med list  and directions to take two daily. 60 tabs with 1 refill. Theresia Lo RN  Jul 11, 2009 5:01 PM

## 2010-03-19 NOTE — Progress Notes (Signed)
Summary: triage  Phone Note Call from Patient Call back at Home Phone [phone]   Caller: Patient Summary of Call: Pt thinks he has taken too much of his bp pill.   Initial call taken by: Clydell Hakim,  March 12, 2009 1:51 PM  Follow-up for Phone Call        took metoprolol. dose is 1 tab two times a day per pt on his bottle. recent talk with him to increase to 50mg  two times a day. he did remember this. wanted to make sure he had dose right. taking the last of his pain meds today. read him md note that he cannot get more until md returns. told him we will call when it is ready to be picked up Follow-up by: Golden Circle RN,  March 12, 2009 1:51 PM  Additional Follow-up for Phone Call Additional follow up Details #1::        Should come to be seen if has symptoms of hypotension, otherwise take meds as rx'ed.   Additional Follow-up by: Rodney Langton MD,  March 18, 2009 5:26 PM    Additional Follow-up for Phone Call Additional follow up Details #2::    Pt notified that he can pick up RX. Confirmed apt on Feb 8th with Pt Follow-up by: Gladstone Pih,  March 19, 2009 2:17 PM  Prescriptions: OXYCODONE HCL 5 MG TABS (OXYCODONE HCL) One tab by mouth q8h as needed for pain  #180 x 0   Entered by:   Rodney Langton MD   Authorized by:   Marland Kitchen Triage Nurse-[facility]   Signed by:   Rodney Langton MD on 03/19/2009   Method used:   Handwritten   RxID:   [id number]
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
## 2010-03-19 NOTE — Progress Notes (Signed)
Summary: Req Dr. Karie Schwalbe to be paged  Phone Note Call from Patient Call back at [phone]   Reason for Call: Talk to Nurse Summary of Call: pt is in room 2010, pt is tied down and cannot get up to go to the restroom, pt has a cathatar & wants it off, pt wants a walker so he can use the restroom himself Initial call taken by: Knox Royalty,  July 21, 2009 2:49 PM  Follow-up for Phone Call        states he is in due to a fall. told him we cannot direct his care from here. the hospital mds will addess his concerns. told him to call the nurses station & ask his nurse to call the md he has in the hospital.  Follow-up by: Golden Circle RN,  July 21, 2009 2:57 PM  Additional Follow-up for Phone Call Additional follow up Details #1::        Noted, pt is on our service now in the hospital  Additional Follow-up by: Rodney Langton MD,  July 21, 2009 8:58 PM     Appended Document: Req Dr. Karie Schwalbe to be paged Pt refused placement and has been discharged, please call him and set up a hospital followup appt with me. Thanks, -Dr. Karie Schwalbe.

## 2010-03-19 NOTE — Progress Notes (Signed)
Summary: phn msg  Phone Note Call from Patient Call back at Home Phone [phone]   Caller: Patient Summary of Call: needs to nurse about meds from hosp. Initial call taken by: De Nurse,  July 28, 2009 9:51 AM  Follow-up for Phone Call        states he feels weak. states Dr. Kellie Simmering cut him off the methotrexate. sent home with 325mg   asa instead of the 81mg  dose.  told him to take it since they changed it in the hospital. asked him to bring all meds with him when he came for his f/u appt & the papers he got from the hospital Follow-up by: Golden Circle RN,  July 28, 2009 10:01 AM  Additional Follow-up for Phone Call Additional follow up Details #1::        Noted, will d/w pt at appt. Additional Follow-up by: Rodney Langton MD,  July 28, 2009 1:33 PM

## 2010-03-19 NOTE — Progress Notes (Signed)
Summary: Rx Ques  Phone Note Call from Patient Call back at [facility] Phone [phone]   Caller: Patient Summary of Call: Has questions about his medication. Initial call taken by: Clydell Hakim,  Jun 20, 2009 2:01 PM  Follow-up for Phone Call        message left to return call. Follow-up by: Theresia Lo RN,  Jun 20, 2009 2:45 PM  Additional Follow-up for Phone Call Additional follow up Details #1::        spoke with patient and the RX he has a question about has been sent to pharmacy because he received call  from pharmacy.. Additional Follow-up by: Theresia Lo RN,  Jun 20, 2009 5:32 PM

## 2010-03-19 NOTE — Progress Notes (Signed)
Summary: Emergency Line Call  Patient called Emergency Line with concern of fever. States that he has had a fever over the last few days, Tm 102. He feels like he has a fever now, but his thermometer isn't working anymore. He endorses cough, SOB, rash, waekness, and back pain - all chronic. He has dermatomysitis. He denies abdominal pain, N/V/D, hematuria, flank pain, dysuria.   Spent > 15 minutes on the phone. Patient hard to redirect. He is upset about care by Dr. Kellie Simmering - stating, "he didn't read my records." States that he has no transportation, no family, no friends to help.   I gave him red flags for calling EMS for evaluation. He states that he may wait until Tues.   Helane Rima DO  Jul 12, 2009 7:55 PM

## 2010-03-19 NOTE — Consult Note (Signed)
Summary: Rheumatology  Rheumatology   Imported By: Clydell Hakim 04/18/2009 11:46:29  _____________________________________________________________________  External Attachment:    Type:   Image     Comment:   External Document

## 2010-03-19 NOTE — Miscellaneous (Signed)
Summary: allergy?  Clinical Lists Changes states they just got a rx for lasix & they have that as one of his alllergies. told her it was not listed on our chart. when he comes in to get it, confirm allergy & if so do not give it to him.Golden Circle RN  April 11, 2009 4:02 PM  He has lots of medications listed on his allergies, if true allergy then I can switch to [facility]. Let me know. Rodney Langton MD  April 11, 2009 4:34 PM    left message.Golden Circle RN  April 14, 2009 8:52 AM  He states he is allergic to lasix. md to add to allergy list & order the demadex. Marland KitchenGolden Circle RN  April 14, 2009 9:15 AM  Done, please let him know he can pick it up.  I want to see him in a month to reassess the leg swelling. Rodney Langton MD  April 14, 2009 10:41 AM   Medications: Changed medication from FUROSEMIDE 20 MG TABS (FUROSEMIDE) One tab by mouth no more than once daily for lower extremity swelling. to DEMADEX 10 MG TABS (TORSEMIDE) One tab by mouth daily - Signed Rx of DEMADEX 10 MG TABS (TORSEMIDE) One tab by mouth daily;  #30 x 3;  Signed;  Entered by: Rodney Langton MD;  Authorized by: Rodney Langton MD;  Method used: Electronically to [facility]*, [address], [place], [place]  [address], Ph: [phone], Fax: [phone] Allergies: Added new allergy or adverse reaction of LASIX - Signed    Prescriptions: DEMADEX 10 MG TABS (TORSEMIDE) One tab by mouth daily  #30 x 3   Entered and Authorized by:   Rodney Langton MD   Signed by:   Rodney Langton MD on 04/14/2009   Method used:   Electronically to        [facility]* (retail)       [address]       [place], [place]  [id number]       Ph: [phone]       Fax: [phone]   RxID:   [id number]  Spoke with pt regarding rx being filled and appt.  Pt said appt was scheduled for end of March on the 29th. Confirmed this with pt. Abundio Miu   April 14, 2009 4:17 PM

## 2010-03-19 NOTE — Assessment & Plan Note (Signed)
 Summary: New pt/  dpg   Vitals Entered By: Letitia Reusing (May 31, 2008 2:47 PM) TD Next Due:  Not Indicated HGBA1C Result Date:  05/31/2008 HGBA1C Next Due:  3 mo   History of Present Illness: 27M with MMP including dermatomyositis, multiple MI's, DM2, HTN, HLD, GERD, RA, Esophageal spasm, Depression, COPD  presents as new Pt.  This patient has a personal MD in about every specialty.  He has a very extensive PMHx as documented in the problem list and PMHx part of centricity.  He comes in to establish primary care after having been released by several other MDs, and also is requesting narcotics refills.  He has a new (since january) onset tremor in his right hand.  Also has seasonal nasal congestion and running.  No other acute complaints.  I spent an hour with this patient.  Tremor:  Present both at rest and on intention, approx 4 hz, sometimes drops things, very concerning for pt.  Pt has had a recent MRI brain that showed subcortical white matter disease but no acute infarct or other abnormalities.  Pt also has somewhat dysarthric speech that is very slow and deliberate.  This is not new.  No tremor in head/neck.  No subjective asymmetric weakness, however he does complain of overall weakness.      Pain:  Pt wants me to prescribe him hydrocodone .  He complains of pain all over, all the time.  Takes hydrocodone /APAP - 7.5/325 two times a day but his last MD stopped giving him narcotics.    DM2:  Unknown A1c, unknown level of control.  Pt claims complaint with meds.  HTN:  Fairly well controlled.  HLD: Unknown when he had his last lipid panel.  Allergic to all statins.  Taking Zetia.  Dermatomyositis:  Taking Methotrexate  and Prednisone , followed by Dr. Lenon, rheum.    Other Medical problems:  Stable on current medications.      Habits & Providers     Allergist: Dr. Camellia Denis     Cardiologist: Dr. Debby Core     ENT: [facility] ENT Dr. Jesus     Gastroenterologist: Dr. Princella Nida     Ophthalmologist: Dr. Carlin Regal     Oral Surgeon: Dr. Laverta     Psychiatrist: Dr. Noralyn [facility] Counselling     Rheumatologist: Dr. Lenon     Urologist: Dr. Amiel  Allergies: 1)  ! Pcn 2)  ! Codeine 3)  ! Wellbutrin 4)  ! * Zyban 5)  ! Cipro 6)  ! Flagyl 7)  ! * Ppd 8)  ! * Tetanus 9)  ! * Iv Ig 10)  ! * Statins 11)  ! Lexapro (Escitalopram Oxalate) 12)  ! Paxil (Paroxetine Hcl) 13)  ! Prozac (Fluoxetine Hcl) 14)  ! Advair Diskus (Fluticasone -Salmeterol) 15)  ! Protopic (Tacrolimus) 16)  ! Temovate E (Clobetasol Prop Emollient Base) 17)  ! Betamethasone Dipropionate (Betamethasone Dipropionate)  Past History:  Past Medical History:    Wheelchair Bound but able to walk short distances    GASTRITIS, HX OF (ICD-V12.79)    HEMORRHOIDS, INTERNAL (ICD-455.0)    DIVERTICULITIS, HX OF (ICD-V12.79)    ANXIETY, CHRONIC (ICD-300.00)    DIVERTICULOSIS OF COLON (ICD-562.10)    COLONIC POLYPS, ADENOMATOUS, HX OF (ICD-V12.72)    DIABETES MELLITUS, TYPE II (ICD-250.00)    COPD (ICD-496)    NEPHROLITHIASIS, HX OF (ICD-V13.01)    ANEMIA, HX OF (ICD-V12.3)    Hx of ESOPHAGEAL STRICTURE (ICD-530.3)    ESOPHAGEAL  MOTILITY DISORDER (ICD-530.5)    DERMATOMYOSITIS (ICD-710.3)    ARTHRITIS (ICD-716.90)    GERD (ICD-530.81)    HYPERTENSION (ICD-401.9)    HYPERLIPIDEMIA (ICD-272.4)        Hx MI 1999, 2000, 2004  Family History:    Mother: Brain Tumor, Heart disease, stroke, DM, ETOH addiction    Biological father: unknown        Half Brother: DM, Obesity  Social History:    Occupation: Disabled, Wheelchair Bound    Patient currently smokes.     Alcohol  Use - yes red wine everyday    Illicit Drug Use - no    Pt lives alone.  No help at home.  Does ADLs himself.  Review of Systems       12 point negative except as in HPI.  Physical Exam  General:  Appears weak, older than stated age, obese, in motorized wheelchair, frail demeanor, NAD Head:   Normocephalic and atraumatic without obvious abnormalities. Eyes:  No corneal or conjunctival inflammation noted. EOMI. Perrla. Ears:  External ear exam shows no significant lesions or deformities.  Otoscopic examination reveals clear canals, tympanic membranes are intact bilaterally without bulging, retraction, inflammation or discharge. Hearing is grossly normal bilaterally. Nose:  External nasal examination shows no deformity or inflammation. Nasal mucosa are pink and moist without lesions or exudates. Mouth:  Oral mucosa and oropharynx without lesions or exudates. Neck:  No deformities, masses, or tenderness noted. Lungs:  Normal respiratory effort, chest expands symmetrically. Lungs are clear to auscultation, no crackles or wheezes. Heart:  Normal rate and regular rhythm. S1 and S2 normal without gallop, murmur, click, rub or other extra sounds. Abdomen:  Bowel sounds positive,abdomen soft and non-tender without masses, organomegaly or hernias noted. Obese. Extremities:  Trace LE edema. Pulses 2+ x4 Neurologic:  No cranial nerve deficits noted.  DTRs are symmetrical throughout. Sensory, motor and coordinative functions appear intact.  Strength 4/5 and symmetric,  ~4hz  tremor present at rest and on intention in right hand.  Finger to nose normal with no exacerbation of tremor.  Some cogwheeling present in right arm.     Impression & Recommendations:  Problem # 1:  TREMOR, RIGHT HAND (ICD-781.0) Etiology unknown at this time but differential includes Parkinsons vs essential tremor.  Will consider trial of propranolol at next appt and neurology referral if no improvement.    Problem # 2:  DIABETES MELLITUS, TYPE II (ICD-250.00) Checked A1c, excellent control, no changes in meds needed..  Check renal function to establish baseline.  RTC 2 weeks.  His updated medication list for this problem includes:    Glucotrol  Xl 5 Mg Xr24h-tab (Glipizide ) .SABRA... 1 tablet by mouth once daily     Lisinopril -hydrochlorothiazide  20-25 Mg Tabs (Lisinopril -hydrochlorothiazide ) ..... Once daily    Aspir-81 81 Mg Tbec (Aspirin ) ..... Once daily  Orders: Comp Met-[facility] [phone]) Lipid-[facility] ([id number]) A1C-[facility] (16963) CBC-[facility] (14972) UA Microalbumin-[facility] (17955)  Problem # 3:  ANEMIA, HX OF (ICD-V12.3) Will check CBC today to establish baseline.  Orders: Comp Met-[facility] [phone]) Lipid-[facility] ([id number]) A1C-[facility] (16963) CBC-[facility] (14972)  Problem # 4:  DERMATOMYOSITIS (ICD-710.3) Limits pt to wheelchair.  Very limited mobility 2/2 pain and weakness.  CPKs followed by Dr. Lenon.  Taking Methotrexate  and Prednisone .  Orders: Comp Met-[facility] [id number]) Lipid-[facility] ([id number]) A1C-[facility] (16963) CBC-[facility] (14972)  Problem # 5:  HYPERTENSION (ICD-401.9) BP approximately at diabetic goal.  Will check Renal function to establish baseline.  His updated medication list for this problem includes:    Lisinopril -hydrochlorothiazide  20-25  Mg Tabs (Lisinopril -hydrochlorothiazide ) ..... Once daily  Orders: Comp Met[facility] [id number]) Lipid-[facility] [phone]) A1C-[facility] (16963) CBC-[facility] (14972) UA Microalbumin-[facility] (17955)  Problem # 6:  HYPERLIPIDEMIA (ICD-272.4) Unknown by pt when or where last lipid panel was done.  Will check one today to establish his baseline.  Though pt allergic to all statins, we could still use niacin , fibrates, bile acid binding resins to lower lipids.  His updated medication list for this problem includes:    Zetia 10 Mg Tabs (Ezetimibe) .SABRA... 1 tab by mouth daily  Orders: Comp Met[facility] [id number]) Lipid-[facility] ([id number]) A1C-[facility] (16963) CBC-[facility] (14972)  Problem # 7:  SPECIAL SCREENING MALIGNANT NEOPLASM OF PROSTATE (ICD-V76.44) Will check PSA today.  Problem # 8:  GENERALIZED PAIN (ICD-780.96) Previous MDs have refused to give narcotics.  This is suspicious.  I will have pt sign a narcotic contract prior to giving him any pain meds.  He does have a real  cause for his pain, dermatomyositis.  I want to see that he will follow up with me several times before I prescribe more hydrocodone .  Problem # 9:  RHINITIS (ICD-472.0) Will try Afrin nasal spray for now.  Reassess in 2 weeks.  Complete Medication List: 1)  Nexium  40 Mg Cpdr (Esomeprazole  magnesium ) .... Take 1 tablet by mouth once a day 2)  Nitroglycerin  0.4 Mg Subl (Nitroglycerin ) .... As needed 3)  Methotrexate  2.5 Mg Tabs (Methotrexate  sodium) .... 5 tabs by mouth q weekly 4)  Oxycodone -acetaminophen  7.5-325 Mg Tabs (Oxycodone -acetaminophen ) .SABRA.. 1 tablet two times a day 5)  Alprazolam  1 Mg Tbdp (Alprazolam ) .SABRA.. 1 tablet three times a day 6)  Cymbalta  60 Mg Cpep (Duloxetine  hcl) .SABRA.. 1 capsule two times a day 7)  Glucotrol  Xl 5 Mg Xr24h-tab (Glipizide ) .SABRA.. 1 tablet by mouth once daily 8)  Prednisone  5 Mg Tabs (Prednisone ) .... 2 tablets by mouth once daily 9)  Lisinopril -hydrochlorothiazide  20-25 Mg Tabs (Lisinopril -hydrochlorothiazide ) .... Once daily 10)  Aspir-81 81 Mg Tbec (Aspirin ) .... Once daily 11)  Afrin Nodrip Severe Congest 0.05 % Soln (Oxymetazoline hcl) .... One spray in each nostril for nasal congestion and runny nose. 12)  Zetia 10 Mg Tabs (Ezetimibe) .SABRA.. 1 tab by mouth daily  Other Orders: PSA-[facility] ([id number])  Patient Instructions: 1)  Good to meet you today, 2)  I want you to go to the lab and get some baseline bloodwork checked.  Then I want you to come back to see me in 2 weeks.  Be sure to keep your appointments with your other doctors, especially your cardiologist and rheumatologist. 3)  Feel free to call the office if you have any questions. 4)  I will have you sign a narcotic contract before I can give you any narcotic pain medications. 5)  -Dr. ONEIDA. Prescriptions: AFRIN NODRIP SEVERE CONGEST 0.05 % SOLN (OXYMETAZOLINE HCL) One spray in each nostril for nasal congestion and runny nose.  #1 bottle x 6   Entered and Authorized by:   Debby Petties MD    Signed by:   Debby Petties MD on 05/31/2008   Method used:   Electronically to        [facility]* (retail)       [address]       [place], [place]  [id number]       Ph: [phone]       Fax: [phone]   RxID:   [id number]   Laboratory Results   Urine Tests  Date/Time Received: May 31, 2008 3:41 PM  Date/Time Reported: May 31, 2008 4:19 PM   Microalbumin (urine): trace mg/L   Comments: ...........test performed by...........SABRAArland Morel, CMA   Blood Tests   Date/Time Received: May 31, 2008 3:41 PM  Date/Time Reported: May 31, 2008 4:19 PM   HGBA1C: 5.6%   (Normal Range: Non-Diabetic - 3-6%   Control Diabetic - 6-8%)  Comments: ...........test performed by...........SABRAArland Morel, CMA

## 2010-03-19 NOTE — Assessment & Plan Note (Signed)
 Summary: medication problem,tcb   Vital Signs:  Patient profile:   60 year old male Height:      70 inches Temp:     97.9 degrees F oral Pulse rate:   107 / minute Pulse rhythm:   regular BP sitting:   123 / 86  (left arm)  Vitals Entered By: Niels Porto LPN (September 18, 2008 9:40 AM) CC: Tremors are worse.  Medication management.  Previous biopsy site feels like it is on fire. Is Patient Diabetic? Yes   Primary Care Provider:  Debby Petties, MD  CC:  Tremors are worse.  Medication management.  Previous biopsy site feels like it is on fire.SABRA  History of Present Illness: Pt with MMP comes in with c/o chronic burning over nipple, Tremor, bladder spasm, runny nose.  Nipple burning:  Has been present chronically, has thickened area of skin overlying that was bx and came back as normal skin  Site healing well.    Tremor:  Got better with ropinirole  but then deteriorated again.  Pt currently taking 1mg  three times a day.  Bladder spasm:  This is also chronic, and has been present for some time now.  No pain, no burning, pt just states that occasionally when laying in bed he will feel the urge to go and urinate on himself before he can make it to the toilet, he is debilitated and needs lots of time to get out of bed.  No flank pain, no fevers/chills, no hx recent catheterization.  Runny Nose:  Helped significantly by nasonex  spray.  Allergies: 1)  ! Pcn 2)  ! Codeine 3)  ! Wellbutrin 4)  ! * Zyban 5)  ! Cipro 6)  ! Flagyl 7)  ! * Ppd 8)  ! * Tetanus 9)  ! * Iv Ig 10)  ! * Statins 11)  ! Lexapro (Escitalopram Oxalate) 12)  ! Paxil (Paroxetine Hcl) 13)  ! Prozac 14)  ! Advair Diskus (Fluticasone -Salmeterol) 15)  ! Protopic (Tacrolimus) 16)  ! Temovate E (Clobetasol Prop Emollient Base) 17)  ! Betamethasone Dipropionate (Betamethasone Dipropionate)  Review of Systems       See HPI  Physical Exam  General:  Well-developed,well-nourished,in no acute  distress; alert,appropriate and cooperative throughout examination Head:  Normocephalic and atraumatic without obvious abnormalities.  Eyes:  No corneal or conjunctival inflammation noted. EOMI. Perrla.  Ears:  External ear exam shows no significant lesions or deformities.  Otoscopic examination reveals clear canals, tympanic membranes are intact bilaterally without bulging, retraction, inflammation or discharge. Hearing is grossly normal bilaterally. Nose:  External nasal examination shows no deformity or inflammation. Nasal mucosa are pink and moist without lesions or exudates. Mouth:  Oral mucosa and oropharynx without lesions or exudates.  Teeth in good repair. Chest Wall:  Nipple looks the same as it always has, appears like a small 1cm hypertrophic scar just medial to left nipple, biopsy site healing well. Lungs:  Normal respiratory effort, chest expands symmetrically. Lungs are clear to auscultation, no crackles or wheezes. Heart:  Normal rate and regular rhythm. S1 and S2 normal without gallop, murmur, click, rub or other extra sounds. Additional Exam:  Placed Lidocaine  ointment over burning area and occluded with tegaderm, pt instructed to wash off in 2 hours.   Impression & Recommendations:  Problem # 1:  SKIN LESION (ICD-709.9) Assessment Unchanged Still causing some burning, but not at biopsy site.  Will try topical lidocaine  as needed for burning, pt to apply, occlude, then wash off  after 2 hours. Biopsy was negative in recent past.  Orders: [facility]- Est  Level 4 (99214)  Problem # 2:  TREMOR, RIGHT HAND (ICD-781.0) Assessment: Unchanged Will increase ropinirole  to 2mg  three times a day.  Orders: [facility]- Est  Level 4 (99214)  Problem # 3:  RHINITIS (ICD-472.0) Assessment: Improved nearly resolved with Nasonex  spray.  Orders: [facility]- Est  Level 4 (99214)  Complete Medication List: 1)  Nexium  40 Mg Cpdr (Esomeprazole  magnesium ) .... Take 1 tablet by mouth once a day 2)   Nitroglycerin  0.4 Mg Subl (Nitroglycerin ) .... As needed 3)  Methotrexate  2.5 Mg Tabs (Methotrexate  sodium) .... 5 tabs by mouth q weekly 4)  Alprazolam  1 Mg Tbdp (Alprazolam ) .SABRA.. 1 tablet three times a day 5)  Cymbalta  60 Mg Cpep (Duloxetine  hcl) .SABRA.. 1 capsule two times a day 6)  Glucotrol  Xl 5 Mg Xr24h-tab (Glipizide ) .SABRA.. 1 tablet by mouth once daily 7)  Prednisone  5 Mg Tabs (Prednisone ) .... 2 tablets by mouth once daily 8)  Lisinopril -hydrochlorothiazide  20-25 Mg Tabs (Lisinopril -hydrochlorothiazide ) .... Once daily 9)  Aspir-81 81 Mg Tbec (Aspirin ) .... Once daily 10)  Niaspan  1000 Mg Cr-tabs (Niacin  (antihyperlipidemic)) .... One tab by mouth qhs 11)  Ropinirole  Hcl 1 Mg Tabs (Ropinirole  hcl) .... Two tabs by mouth three times a day 12)  Oxycodone  Hcl 5 Mg Tabs (Oxycodone  hcl) .... One tab by mouth q8h as needed for pain 13)  Nasonex  50 Mcg/act Susp (Mometasone  furoate) .... 2 sprays in each nostril two times a day. 14)  Oxybutynin  Chloride 5 Mg Tabs (Oxybutynin  chloride) .... One tab by mouth two times a day 15)  Lidocaine  5 % Oint (Lidocaine ) .... Apply to painful area on nipple and occlude with teraderm/saran wrap x 2 hours then wash off  Patient Instructions: 1)  Good to see you today, 2)  Oxybutinin 5mg  two times a day for overactive bladder. 3)  Lidocaine  ointment to nipple lesion when painful, occlude with tegaderm or saran wrap for 2 hours then wash off. 4)  Increase your ropinirole  to 2 tabs three times a day for tremor. 5)  Keep using your Nasonex  nasal spray 2 sprays in each nostril 2x/day. 6)  Come back to see me as needed. 7)  -Dr. ONEIDA. Prescriptions: LIDOCAINE  5 % OINT (LIDOCAINE ) Apply to painful area on nipple and occlude with teraderm/saran wrap x 2 hours then wash off  #1 tube x 6   Entered and Authorized by:   Debby Petties MD   Signed by:   Debby Petties MD on 09/18/2008   Method used:   Electronically to        [facility]* (retail)        [address]       [place], [place]  [id number]       Ph: [phone]       Fax: [phone]   RxID:   [id number] OXYBUTYNIN  CHLORIDE 5 MG TABS (OXYBUTYNIN  CHLORIDE) One tab by mouth two times a day  #60 x 6   Entered and Authorized by:   Debby Petties MD   Signed by:   Debby Petties MD on 09/18/2008   Method used:   Electronically to        [facility]* (retail)       [address]       [place], [place]  [id number]       Ph: [phone]       Fax: [phone]   RxID:  [id number]   Flex Sig Next Due:  Not Indicated Hemoccult Next Due:  Not Indicated Last HGBA1C:  5.7 (08/16/2008 1:14:22 PM) HGBA1C Next Due:  6 mo

## 2010-03-19 NOTE — Miscellaneous (Signed)
  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) FIBROMYALGIA (ICD-729.1) VERTIGO (ICD-780.4) TREMOR, RIGHT HAND (ICD-781.0) SPECIAL SCREENING MALIGNANT NEOPLASM OF PROSTATE (ICD-V76.44) GASTRITIS, HX OF (ICD-V12.79) HEMORRHOIDS, INTERNAL (ICD-455.0) DIVERTICULITIS, HX OF (ICD-V12.79) ANXIETY, CHRONIC (ICD-300.00) DIVERTICULOSIS OF COLON (ICD-562.10) COLONIC POLYPS, ADENOMATOUS, HX OF (ICD-V12.72) NEPHROLITHIASIS, HX OF (ICD-V13.01) ANEMIA, HX OF (ICD-V12.3) Hx of ESOPHAGEAL STRICTURE (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) DERMATOMYOSITIS (ICD-710.3) ARTHRITIS (ICD-716.90) RHINITIS (ICD-472.0) GENERALIZED PAIN (ICD-780.96)   DERMATOMYOSITIS (ICD-710.3) ARTHRITIS (ICD-716.90) GERD (ICD-530.81) HYPERTENSION (ICD-401.9) HYPERLIPIDEMIA (ICD-272.4) Hx MI 1999, 2000, 2004  (01/20/2009 9:19)        Past History:  Past Medical History: Went to rehab 12-16-08, discharged on day 1 of rehab 2/2 lack of rehab potention and patient's request Wheelchair Bound but able to walk short distances ISCHEMIC HEART DISEASE (ICD-414.9) COPD (ICD-496) HYPERTENSION (ICD-401.9) HYPERLIPIDEMIA (ICD-272.4) GERD (ICD-530.81) HIATAL HERNIA (ICD-553.3) DIABETES MELLITUS, TYPE II (ICD-250.00) DEPRESSION, 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) RHEUMATOID ARTHRITIS (ICD-714.0) 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) FIBROMYALGIA (ICD-729.1) VERTIGO (ICD-780.4) TREMOR, RIGHT HAND (ICD-781.0) SPECIAL SCREENING MALIGNANT NEOPLASM OF PROSTATE  (ICD-V76.44) GASTRITIS, HX OF (ICD-V12.79) HEMORRHOIDS, INTERNAL (ICD-455.0) DIVERTICULITIS, HX OF (ICD-V12.79) ANXIETY, CHRONIC (ICD-300.00) DIVERTICULOSIS OF COLON (ICD-562.10) COLONIC POLYPS, ADENOMATOUS, HX OF (ICD-V12.72) NEPHROLITHIASIS, HX OF (ICD-V13.01) ANEMIA, HX OF (ICD-V12.3) Hx of ESOPHAGEAL STRICTURE (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) DERMATOMYOSITIS (ICD-710.3) ARTHRITIS (ICD-716.90) RHINITIS (ICD-472.0) GENERALIZED PAIN (ICD-780.96)   DERMATOMYOSITIS (ICD-710.3) ARTHRITIS (ICD-716.90) GERD (ICD-530.81) HYPERTENSION (ICD-401.9) HYPERLIPIDEMIA (ICD-272.4) Hx MI 1999, 2000, 2004

## 2010-03-19 NOTE — Miscellaneous (Signed)
  Clinical Lists Changes  Problems: Removed problem of MOTOR PROBLEMS WITH LIMBS (ICD-V49.2) Removed problem of HYPOKALEMIA, MILD (ICD-276.8) Removed problem of NEED PROPHYLACTIC VACCINATION&INOCULATION FLU (ICD-V04.81) Removed problem of GLOSSITIS (ICD-529.0) Removed problem of ACCIDENTAL FALL (ICD-E888.9) Removed problem of SKIN LESION (ICD-709.9) Removed problem of SPECIAL SCREENING MALIGNANT NEOPLASM OF PROSTATE (ICD-V76.44) Removed problem of GASTRITIS, HX OF (ICD-V12.79) Removed problem of DIVERTICULITIS, HX OF (ICD-V12.79) Removed problem of COLONIC POLYPS, ADENOMATOUS, HX OF (ICD-V12.72) Removed problem of NEPHROLITHIASIS, HX OF (ICD-V13.01) Removed problem of ANEMIA, HX OF (ICD-V12.3) Removed problem of History of  ESOPHAGEAL STRICTURE (ICD-530.3) Removed problem of ARTHRITIS (ICD-716.90) Removed problem of RHINITIS (ICD-472.0)

## 2010-03-19 NOTE — Progress Notes (Signed)
Summary: Rx Req  Phone Note Refill Request Call back at Home Phone [phone] Message from:  Patient  Refills Requested: Medication #1:  OXYCODONE HCL 5 MG TABS One tab by mouth q8h as needed for pain Initial call taken by: Clydell Hakim,  September 08, 2009 2:17 PM    Prescriptions: OXYCODONE HCL 5 MG TABS (OXYCODONE HCL) One tab by mouth q8h as needed for pain  #180 x 0   Entered and Authorized by:   Rodney Langton MD   Signed by:   Rodney Langton MD on 09/09/2009   Method used:   Print then Give to Patient   RxID:   [id number]

## 2010-03-19 NOTE — Miscellaneous (Signed)
Summary: wheelchair broken wants new one  Clinical Lists Changes  patient comes to office today and advised Patrick Holt that the arm on his  power wheelchair s broken.  he has checked into having it repaired and will cost $700.00. he wants a an RX for new wheelchair, advised will send message to MD. Theresia Lo RN  January 15, 2010 3:22 PM  Pt needs to call either a medical supply store or the wheelchair company first to determine if a new chair will be covered before we rx another one. Rodney Langton MD  January 15, 2010 4:59 PM    message left on voicemail of above message from MD. Theresia Lo RN  January 16, 2010 11:01 AM   message again left on voicemail  of message from MD. Theresia Lo RN  January 19, 2010 2:46 PM

## 2010-03-19 NOTE — Assessment & Plan Note (Signed)
 Summary: f/up/flu shot,tcb   Vital Signs:  Patient profile:   60 year old male Temp:     98.6 degrees F Pulse rate:   96 / minute BP sitting:   150 / 99  (right arm)  Vitals Entered By: Nathanel Saba RN (February 12, 2009 1:39 PM) CC: Follow up and immunizations Is Patient Diabetic? Yes Pain Assessment Patient in pain? yes     Location: all over Onset of pain  Chronic   Primary Care Provider:  Debby Petties, MD  CC:  Follow up and immunizations.  History of Present Illness: 27y M with dermatomyositis here for fu.  Glossitis: improved  Dermatomyositis:  Pt still on methotrexate , still taking folate/b12.  Needs referral to Dr. Everlean, Dr. Lenon will no longer see him.  Vertigo:  Has not filled meclizine  yet.  Chronic Pain: Controlling pain well, does not need oxycodone  refill  yet.  PT: Finished PT.  HTN:  BP elevated today.    Tremor:  controlled, needs ropinirole  refill.  Preventive: needs PCV shot.  Habits & Providers  Alcohol -Tobacco-Diet     Tobacco Status: current     Tobacco Counseling: to quit use of tobacco products     Cigarette Packs/Day: 0.25  Current Medications (verified): 1)  Nexium  40 Mg Cpdr (Esomeprazole  Magnesium ) .... Take 1 Tablet By Mouth Once A Day 2)  Nitroglycerin  0.4 Mg Subl (Nitroglycerin ) .... As Needed 3)  Methotrexate  2.5 Mg Tabs (Methotrexate  Sodium) .... 5 Tabs By Mouth Q Weekly 4)  Alprazolam  1 Mg Tbdp (Alprazolam ) .SABRA.. 1 Tablet Three Times A Day 5)  Cymbalta  60 Mg Cpep (Duloxetine  Hcl) .SABRA.. 1 Capsule Two Times A Day 6)  Glucotrol  Xl 5 Mg Xr24h-Tab (Glipizide ) .SABRA.. 1 Tablet By Mouth Once Daily 7)  Prednisone  5 Mg Tabs (Prednisone ) .... 2 Tablets By Mouth Once Daily 8)  Lisinopril -Hydrochlorothiazide  20-25 Mg Tabs (Lisinopril -Hydrochlorothiazide ) .... Once Daily 9)  Aspir-81 81 Mg Tbec (Aspirin ) .... Once Daily 10)  Niaspan  1000 Mg Cr-Tabs (Niacin  (Antihyperlipidemic)) .... One Tab By Mouth Qhs 11)  Ropinirole  Hcl 2  Mg Tabs (Ropinirole  Hcl) .... One Tab By Mouth Tid 12)  Oxycodone  Hcl 5 Mg Tabs (Oxycodone  Hcl) .... One Tab By Mouth Q8h As Needed For Pain 13)  Nasonex  50 Mcg/act Susp (Mometasone  Furoate) .... 2 Sprays in Each Nostril Two Times A Day. 14)  Oxybutynin  Chloride 5 Mg Tabs (Oxybutynin  Chloride) .... One Tab By Mouth Two Times A Day 15)  Nystatin  100000 Unit/ml Susp (Nystatin ) .... 6ml By Mouth, Swish, Keep in Mouth As Long As Possible, Then Swallow.  Do This 4x/day.  Continue At Least 48h After Symptoms Resolve. 16)  Antivert  25 Mg Tabs (Meclizine  Hcl) .... One Tab By Mouth Q8h As Needed For Vertigo. 17)  Metoprolol  Tartrate 25 Mg Tabs (Metoprolol  Tartrate) .... One Tab By Mouth Bid  Allergies (verified): 1)  ! Pcn 2)  ! Codeine 3)  ! Wellbutrin 4)  ! * Zyban 5)  ! Cipro 6)  ! Flagyl 7)  ! * Ppd 8)  ! * Tetanus 9)  ! * Iv Ig 10)  ! * Statins 11)  ! Lexapro (Escitalopram Oxalate) 12)  ! Paxil (Paroxetine Hcl) 13)  ! Prozac 14)  ! Advair Diskus (Fluticasone -Salmeterol) 15)  ! Protopic (Tacrolimus) 16)  ! Temovate E (Clobetasol Prop Emollient Base) 17)  ! 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) FIBROMYALGIA (ICD-729.1) VERTIGO (ICD-780.4) TREMOR, RIGHT HAND (ICD-781.0) SPECIAL SCREENING MALIGNANT NEOPLASM OF PROSTATE (ICD-V76.44) GASTRITIS, HX OF (ICD-V12.79) HEMORRHOIDS, INTERNAL (ICD-455.0) DIVERTICULITIS, HX OF (ICD-V12.79) ANXIETY, CHRONIC  (ICD-300.00) DIVERTICULOSIS OF COLON (ICD-562.10) COLONIC POLYPS, ADENOMATOUS, HX OF (ICD-V12.72) NEPHROLITHIASIS, HX OF (ICD-V13.01) ANEMIA, HX OF (ICD-V12.3) Hx of ESOPHAGEAL STRICTURE (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) DERMATOMYOSITIS (ICD-710.3) ARTHRITIS (ICD-716.90) RHINITIS (ICD-472.0) GENERALIZED PAIN (ICD-780.96)   DERMATOMYOSITIS (ICD-710.3) ARTHRITIS (ICD-716.90) GERD (ICD-530.81) HYPERTENSION (ICD-401.9) HYPERLIPIDEMIA (ICD-272.4) Hx MI 1999, 2000, 2004  Past Surgical History: Last updated: 01/26/2008 knee surgery for meniscal tear lumbar diskectomy  Family History: Last updated: 05/31/2008 Mother: Brain Tumor, Heart disease, stroke, DM, ETOH addiction Biological father: unknown  Half Brother: DM, Obesity  Social History: Last updated: 05/31/2008 Occupation: Disabled, Wheelchair Bound Patient currently smokes.  Alcohol  Use - yes red wine everyday Illicit Drug Use - no Pt lives alone.  No help at home.  Does ADLs himself.  Social History: Packs/Day:  0.25  Review of Systems       See HPI  Physical Exam  General:  Well-developed,well-nourished,in no acute distress; alert,appropriate and cooperative throughout examination Lungs:  Normal respiratory effort, chest expands symmetrically. Lungs are clear to auscultation, no crackles or wheezes. Heart:  Normal rate and regular rhythm. S1 and S2 normal without gallop, murmur, click, rub or other extra sounds. Neurologic:  No tremor noted.   Impression & Recommendations:  Problem # 1:  GLOSSITIS (ICD-529.0) Assessment Improved  Orders: [facility]- Est  Level 4 (00785)  Problem # 2:  HYPERTENSION (ICD-401.9) Assessment: Deteriorated Added metoprolol  25 two times a day, RTC 2 weeks for RN recheck of BP.  His updated medication list for this problem includes:    Lisinopril -hydrochlorothiazide  20-25 Mg Tabs (Lisinopril -hydrochlorothiazide ) ..... Once daily    Metoprolol  Tartrate 25 Mg Tabs  (Metoprolol  tartrate) ..... One tab by mouth bid  Orders: [facility]- Est  Level 4 (00785)  Problem # 3:  VERTIGO (ICD-780.4) Assessment: Unchanged Has not filled meclizine  yet.  Says pharmacy lost script.  Will refill.  His updated medication list for this problem includes:    Antivert  25 Mg Tabs (Meclizine  hcl) ..... One tab by mouth q8h as needed for vertigo.  Orders: [facility]- Est  Level 4 (99214)  Problem # 4:  TREMOR, RIGHT HAND (ICD-781.0) Assessment: Improved Needs refill on ropinirole .  Orders: [facility]- Est  Level 4 (00785)  Problem # 5:  DERMATOMYOSITIS (ICD-710.3) Assessment: Unchanged Needs referral to Dr. Everlean.  No refills needed yet on MTX or prednisone .  Orders: [facility]- Est  Level 4 (00785) Rheumatology Referral (Rheumatology)  Problem # 6:  GENERALIZED PAIN (ICD-780.96) Assessment: Unchanged Does not neeed refills yet.  Orders: [facility]- Est  Level 4 (00785)  Problem # 7:  Preventive Health Care (ICD-V70.0) Assessment: Comment Only Getting PCV today (has COPD)  Complete Medication List: 1)  Nexium  40 Mg Cpdr (Esomeprazole  magnesium ) .... Take 1 tablet by mouth once a day 2)  Nitroglycerin  0.4 Mg Subl (Nitroglycerin ) .... As needed 3)  Methotrexate  2.5 Mg Tabs (Methotrexate  sodium) .... 5 tabs by mouth q weekly 4)  Alprazolam  1 Mg Tbdp (Alprazolam ) .SABRA.. 1 tablet three times a day 5)  Cymbalta  60 Mg Cpep (Duloxetine  hcl) .SABRA.. 1 capsule two times a day 6)  Glucotrol   Xl 5 Mg Xr24h-tab (Glipizide ) .SABRA.. 1 tablet by mouth once daily 7)  Prednisone  5 Mg Tabs (Prednisone ) .... 2 tablets by mouth once daily 8)  Lisinopril -hydrochlorothiazide  20-25 Mg Tabs (Lisinopril -hydrochlorothiazide ) .... Once daily 9)  Aspir-81 81 Mg Tbec (Aspirin ) .... Once daily 10)  Niaspan  1000 Mg Cr-tabs (Niacin  (antihyperlipidemic)) .... One tab by mouth qhs 11)  Ropinirole  Hcl 2 Mg Tabs (Ropinirole  hcl) .... One tab by mouth tid 12)  Oxycodone  Hcl 5 Mg Tabs (Oxycodone  hcl) .... One tab by mouth q8h  as needed for pain 13)  Nasonex  50 Mcg/act Susp (Mometasone  furoate) .... 2 sprays in each nostril two times a day. 14)  Oxybutynin  Chloride 5 Mg Tabs (Oxybutynin  chloride) .... One tab by mouth two times a day 15)  Nystatin  100000 Unit/ml Susp (Nystatin ) .... 6ml by mouth, swish, keep in mouth as long as possible, then swallow.  do this 4x/day.  continue at least 48h after symptoms resolve. 16)  Antivert  25 Mg Tabs (Meclizine  hcl) .... One tab by mouth q8h as needed for vertigo. 17)  Metoprolol  Tartrate 25 Mg Tabs (Metoprolol  tartrate) .... One tab by mouth bid  Other Orders: Pneumococcal Vaccine (90732) Admin 1st Vaccine (09528) Admin 1st Vaccine [facility]) [id number]  Patient Instructions: 1)  Good to see you, 2)  Refilled ropinirole , antivert  3)  Started metoprolol  for BP 4)  Gave pneumonia vaccine 5)  Referral made to Dr. Everlean Rheumatology. 6)  Come back in 2 weeks for an RN BP check to see if the metoprolol  is helping your BP.  You don't have to see me at this visit. 7)  Come back to see me in a year. 8)  -Dr. ONEIDA. Prescriptions: METOPROLOL  TARTRATE 25 MG TABS (METOPROLOL  TARTRATE) One tab by mouth BID  #60 x 0   Entered and Authorized by:   Debby Petties MD   Signed by:   Debby Petties MD on 02/12/2009   Method used:   Electronically to        [facility]* (retail)       [address]       [place], [place]  [id number]       Ph: [phone]       Fax: [phone]   RxID:   [id number] ANTIVERT  25 MG TABS (MECLIZINE  HCL) One tab by mouth q8h as needed for vertigo.  #30 x 6   Entered and Authorized by:   Debby Petties MD   Signed by:   Debby Petties MD on 02/12/2009   Method used:   Electronically to        [facility]* (retail)       [address]       [place], [place]  [id number]       Ph: [phone]       Fax: [phone]   RxID:   [id number] ROPINIROLE  HCL 2 MG TABS (ROPINIROLE  HCL) One tab by mouth TID   #90 x 6   Entered and Authorized by:   Debby Petties MD   Signed by:   Debby Petties MD on 02/12/2009   Method used:   Electronically to        [facility]* (retail)       [address]       [place], [place]  [id number]       Ph: [phone]       Fax: [phone]   RxID:   [id number]   Last Flu Vaccine:  Fluvax 3+ (  02/19/2008 9:45:38 AM) Flu Vaccine Result Date:  02/19/2008 Flu Vaccine Result:  given Flu Vaccine Next Due:  1 yr    Pneumovax Vaccine    Vaccine Type: Pneumovax    Site: right deltoid    Mfr: Merck    Dose: 0.5 ml    Route: IM    Given by: Nathanel Saba RN    Exp. Date: 01/25/2010    Lot #: [id number]    VIS given: 09/13/95 version given February 12, 2009.

## 2010-03-19 NOTE — Assessment & Plan Note (Signed)
 Summary: f/u visit/bmc   Vital Signs:  Patient profile:   60 year old male Height:      70 inches Temp:     98.8 degrees F Pulse rate:   65 / minute BP sitting:   131 / 87  Vitals Entered By: Harlene Carte CMA (July 25, 2008 1:46 PM) CC: f/u Pain Assessment Patient in pain? yes     Location: all over Intensity: 10   Primary Care Provider:  Debby Petties, MD  CC:  f/u.  History of Present Illness: 36M MMP here for f/u of tremor, pain 2/2 dermatomyositis, and c/o lesion to left nipple.  Tremor:  No improvement with propranolol.  Same character as previously described.  Pain:  Still 10/10 and located everywhere, including hair, per pt.  I had previously refused to prescribe narcotics until he had established continuity of care with me.  This is his 4th visit.  Was on oxycodone  5 previously QID.  Lesion to left nipple:  Present weeks to months, just medial to left nipple, has recently become painful.  No other lesions of similar nature.   HLD:  Taking Niacin  for Triglycerides, no flushing.  Allergies: 1)  ! Pcn 2)  ! Codeine 3)  ! Wellbutrin 4)  ! * Zyban 5)  ! Cipro 6)  ! Flagyl 7)  ! * Ppd 8)  ! * Tetanus 9)  ! * Iv Ig 10)  ! * Statins 11)  ! Lexapro (Escitalopram Oxalate) 12)  ! Paxil (Paroxetine Hcl) 13)  ! Prozac (Fluoxetine Hcl) 14)  ! Advair Diskus (Fluticasone -Salmeterol) 15)  ! Protopic (Tacrolimus) 16)  ! Temovate E (Clobetasol Prop Emollient Base) 17)  ! 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) FIBROMYALGIA (ICD-729.1) VERTIGO  (ICD-780.4) TREMOR, RIGHT HAND (ICD-781.0) SPECIAL SCREENING MALIGNANT NEOPLASM OF PROSTATE (ICD-V76.44) GASTRITIS, HX OF (ICD-V12.79) HEMORRHOIDS, INTERNAL (ICD-455.0) DIVERTICULITIS, HX OF (ICD-V12.79) ANXIETY, CHRONIC (ICD-300.00) DIVERTICULOSIS OF COLON (ICD-562.10) COLONIC POLYPS, ADENOMATOUS, HX OF (ICD-V12.72) NEPHROLITHIASIS, HX OF (ICD-V13.01) ANEMIA, HX OF (ICD-V12.3) Hx of ESOPHAGEAL STRICTURE (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) DERMATOMYOSITIS (ICD-710.3) ARTHRITIS (ICD-716.90) RHINITIS (ICD-472.0) GENERALIZED PAIN (ICD-780.96)   DERMATOMYOSITIS (ICD-710.3) ARTHRITIS (ICD-716.90) GERD (ICD-530.81) HYPERTENSION (ICD-401.9) HYPERLIPIDEMIA (ICD-272.4) Hx MI 1999, 2000, 2004  Past Surgical History: Last updated: 01/26/2008 knee surgery for meniscal tear lumbar diskectomy  Family History: Last updated: 05/31/2008 Mother: Brain Tumor, Heart disease, stroke, DM, ETOH addiction Biological father: unknown  Half Brother: DM, Obesity  Social History: Last updated: 05/31/2008 Occupation: Disabled, Wheelchair Bound Patient currently smokes.  Alcohol  Use - yes red wine everyday Illicit Drug Use - no Pt lives alone.  No help at home.  Does ADLs himself.  Review of Systems  The patient denies anorexia, fever, chest pain, enlarged lymph nodes, and breast masses.    Physical Exam  General:  Well-developed,well-nourished,in no acute distress; alert,appropriate and cooperative throughout examination Neck:  No deformities, masses, or tenderness noted. Chest Wall:  1cm lesion, appears somewhat like a hypertrophic scar, located just medial to left nipple, TTP, non-erythematous. Lungs:  Normal respiratory effort, chest expands symmetrically. Lungs are clear to auscultation, no crackles or wheezes. Heart:  Normal rate and regular rhythm. S1 and S2 normal without gallop, murmur, click, rub or other extra sounds. Abdomen:  Bowel  sounds positive,abdomen soft  and non-tender without masses, organomegaly or hernias noted. Neurologic:  4hz  tremor in right hand>left.  No change with intention, Cogwheeling present in B/L upper ext.   Impression & Recommendations:  Problem # 1:  SKIN LESION (ICD-709.9) Assessment New Unsure of etiology, would opt to biopsy lesion to r/o skin CA.  Will have pt come back for Bx at my next available spot.  Orders: [facility]- Est  Level 4 (00785)  Problem # 2:  HYPERTENSION (ICD-401.9) Assessment: Deteriorated Has been controlled at previous visits.  Mildly elevated today, no changes, will recheck at next visit.  The following medications were removed from the medication list:    Propranolol Hcl 60 Mg Tabs (Propranolol hcl) ..... One tab by mouth tid His updated medication list for this problem includes:    Lisinopril -hydrochlorothiazide  20-25 Mg Tabs (Lisinopril -hydrochlorothiazide ) ..... Once daily  Problem # 3:  HYPERLIPIDEMIA (ICD-272.4) Assessment: Unchanged Taking Niacin  for triglycerides.  Continue current tx.  Would recheck at next visit. 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  Will try non-ergot Dopamine agonist. Ropinirole  0.25 three times a day x1wk then 0.5 three times a day x1wk, then 0.75 three times a day x 1 wk, then 1mg  three times a day.  Reassess at subsequent visits.  Orders: [facility]- Est  Level 4 (00785)  Problem # 5:  DERMATOMYOSITIS (ICD-710.3) Assessment: Unchanged This is the likely cause of his chronic widespread pain.  Started Oxycontin  and gave 3 month supply but pt called back from pharmacy informing office that he could not afford this.  Agreed to write a script for oxycodone , enough for 45 days, but he has to return script for Oxycontin  so that it can be destroyed.    Orders: [facility]- Est  Level 4 (99214)  Complete Medication List: 1)  Nexium  40 Mg Cpdr (Esomeprazole   magnesium ) .... Take 1 tablet by mouth once a day 2)  Nitroglycerin  0.4 Mg Subl (Nitroglycerin ) .... As needed 3)  Methotrexate  2.5 Mg Tabs (Methotrexate  sodium) .... 5 tabs by mouth q weekly 4)  Oxycodone -acetaminophen  7.5-325 Mg Tabs (Oxycodone -acetaminophen ) .SABRA.. 1 tablet two times a day 5)  Alprazolam  1 Mg Tbdp (Alprazolam ) .SABRA.. 1 tablet three times a day 6)  Cymbalta  60 Mg Cpep (Duloxetine  hcl) .SABRA.. 1 capsule two times a day 7)  Glucotrol  Xl 5 Mg Xr24h-tab (Glipizide ) .SABRA.. 1 tablet by mouth once daily 8)  Prednisone  5 Mg Tabs (Prednisone ) .... 2 tablets by mouth once daily 9)  Lisinopril -hydrochlorothiazide  20-25 Mg Tabs (Lisinopril -hydrochlorothiazide ) .... Once daily 10)  Aspir-81 81 Mg Tbec (Aspirin ) .... Once daily 11)  Afrin Nodrip Severe Congest 0.05 % Soln (Oxymetazoline hcl) .... One spray in each nostril for nasal congestion and runny nose. 12)  Niacin  Flush Free 500 Mg Caps (Inositol niacinate) .... One tab by mouth qhs x4 weeks, then 2 tabs by mouth qhs x 4 more weeks. 13)  Ropinirole  Hcl 0.25 Mg Tabs (Ropinirole  hcl) .... One tab by mouth tid for one week, then 2 tabs by mouth tid for a week, then 3 tabs by mouth tid x1wk, then 4 tabs by mouth tid 14)  Oxycodone  Hcl 5 Mg Tabs (Oxycodone  hcl) .... One tab by mouth q6h as needed for pain  Patient Instructions: 1)  Good to see you, 2)  STOP the propranolol, start taking a new medicine Ropinirole  0.25mg  3x/day for a week, then 0.5mg  3x/day  for a week, then 0.75mg  3x a day for a week, then 1mg  3x a day.  This should help with your tremor. 3)  I will start a longer acting pain medicine, oxycontin  10mg  2x a day. (Enough for 3 months)  No more pain medicine until 10/25/08. 4)  Come back at my next available appt to biopsy your skin lesion. 5)  -Dr. ONEIDA. Prescriptions: OXYCODONE  HCL 5 MG TABS (OXYCODONE  HCL) One tab by mouth q6h as needed for pain  #180 x 0   Entered and Authorized by:   Debby Petties MD   Signed by:   Debby Petties MD on 07/25/2008   Method used:   Print then Give to Patient   RxID:   [id number] OXYCONTIN  10 MG XR12H-TAB (OXYCODONE  HCL) One tab by mouth q12  #180 x 0   Entered and Authorized by:   Debby Petties MD   Signed by:   Debby Petties MD on 07/25/2008   Method used:   Print then Give to Patient   RxID:   [id number] ROPINIROLE  HCL 0.25 MG TABS (ROPINIROLE  HCL) One tab by mouth TID for one week, then 2 tabs by mouth TID for a week, then 3 tabs by mouth TID x1wk, then 4 tabs by mouth TID  #180 x 0   Entered and Authorized by:   Debby Petties MD   Signed by:   Debby Petties MD on 07/25/2008   Method used:   Print then Give to Patient   RxID:   [id number]   Appended Document: f/u visit/bmc Pt cannot afford oxycontin , script written for oxycodone , pt to return script for oxycontin , staff instructed to destroy it before giving script for oxycodone .

## 2010-03-19 NOTE — Progress Notes (Signed)
Summary: Ref Req  Phone Note Call from Patient   Caller: Patient Summary of Call: Wants to change rheumatologist to [facility] [phone] and fax number is [phone].  He would like [place] to help him get the appointment. Initial call taken by: Clydell Hakim,  August 20, 2009 4:50 PM  Follow-up for Phone Call        to PCP Follow-up by: Gladstone Pih,  August 20, 2009 4:58 PM  Additional Follow-up for Phone Call Additional follow up Details #1::        Referral placed. Additional Follow-up by: Rodney Langton MD,  August 21, 2009 8:49 AM    Additional Follow-up for Phone Call Additional follow up Details #2::    referral flaged to [facility] Follow-up by: Gladstone Pih,  August 21, 2009 9:37 AM

## 2010-03-19 NOTE — Progress Notes (Signed)
Summary: FYI  Phone Note Call from Patient   Summary of Call: ECHO has been resch to 3/17 Initial call taken by: De Nurse,  April 24, 2009 4:56 PM  Follow-up for Phone Call        Noted. Follow-up by: Rodney Langton MD,  April 27, 2009 2:38 PM

## 2010-03-19 NOTE — Progress Notes (Signed)
Summary: phn msg  Phone Note Call from Patient Call back at Home Phone [phone]   Caller: Patient Summary of Call: Pt states he has taken to bad falls since yesterday.  Says he didn't talk to a nurse yesterday though there is a note from the triage nurse in his chart.  He states he can't get up and get dressed.  Pt requesting Dr. Benjamin Stain to call him.   Initial call taken by: Clydell Hakim,  April 22, 2009 11:09 AM  Follow-up for Phone Call        He was instructed to go to the ED yesterday, there is no record of his ER visit.  I have reviewed the previous triage notes.  He has 2 choices, he should be seen with the falls and so can either come to the office or call 911 and go to the ED.  I am awaiting and ECHO and recently started him on lasix so he should have lytes checked at least.  Have him make an appt to see me at my next slot if he declines ER or office appt now. Follow-up by: Rodney Langton MD,  April 23, 2009 8:47 AM    called pt and told him that it is imperative for him to come in or go to the ED to be checked out. he said that he will try to keep his appt to have the echo done on tomorrow.Loralee Pacas CMA  April 23, 2009 9:15 AM

## 2010-03-19 NOTE — Progress Notes (Signed)
  Phone Note Call from Patient   Caller: Patient Call For: [phone] Summary of Call: Please call patient to inform him if he need to continue taking his Lisinopril or not. Initial call taken by: Abundio Miu,  January 27, 2010 10:37 AM  Follow-up for Phone Call        Yes, he is on lisinopril/hctz combo that he should still be taking, pls re-inform him of this. Follow-up by: Rodney Langton MD,  January 27, 2010 11:42 AM  Additional Follow-up for Phone Call Additional follow up Details #1::        spoke with pt and informed him to take rx like prescribed.  Additional Follow-up by: Jimmy Footman, CMA,  January 27, 2010 11:51 AM

## 2010-03-19 NOTE — Progress Notes (Signed)
Summary: [place] an office visit to discuss colon/endo  Phone Note Call from Patient Call back at Home Phone [phone]   Caller: Patient Call For: Dr Juanda Chance Summary of Call: Patient called and wants to schedule an office visit with Dr Juanda Chance to discuss colon and an endo.  patient is scheduled for 01/22/10 11:00 Initial call taken by: Darcey Nora RN, CGRN,  December 24, 2009 3:07 PM

## 2010-03-19 NOTE — Progress Notes (Signed)
Summary: phn msg  Phone Note Call from Patient Call back at Home Phone [phone]   Caller: Patient Summary of Call: pt cannot take the liquids b/c it makes him sick to his stomach and needs phengran in order to take this. pls advise Initial call taken by: De Nurse,  Jul 07, 2009 2:08 PM  Follow-up for Phone Call        to PCP Follow-up by: Gladstone Pih,  Jul 07, 2009 4:20 PM  Additional Follow-up for Phone Call Additional follow up Details #1::        Rx for phenergan sent.  Will also have script for oxycodone ready.  Additional Follow-up by: Rodney Langton MD,  Jul 07, 2009 8:36 PM    Additional Follow-up for Phone Call Additional follow up Details #2::    Pt notified, voiced understanding Follow-up by: Gladstone Pih,  Jul 08, 2009 9:52 AM  New/Updated Medications: PROMETHAZINE HCL 25 MG TABS (PROMETHAZINE HCL) One tab by mouth q6h as needed for nausea Prescriptions: PROMETHAZINE HCL 25 MG TABS (PROMETHAZINE HCL) One tab by mouth q6h as needed for nausea  #30 x 3   Entered and Authorized by:   Rodney Langton MD   Signed by:   Rodney Langton MD on 07/07/2009   Method used:   Electronically to        [facility]* (retail)       [address]       [place], [place]  [id number]       Ph: [phone]       Fax: [phone]   RxID:   [id number]

## 2010-03-19 NOTE — Progress Notes (Signed)
Summary: Rx Ques  Phone Note Call from Patient Call back at [facility] Phone [phone]   Caller: Patient Summary of Call: Needs to know if when he finish taking Torsemide 10mg  does he continue taking if so he needs refills.   Initial call taken by: Clydell Hakim,  August 07, 2009 2:56 PM  Follow-up for Phone Call        will forward to MD. If so please send in RX. Follow-up by: Theresia Lo RN,  August 07, 2009 3:36 PM  Additional Follow-up for Phone Call Additional follow up Details #1::        Yes, cont taking it, will refill. Additional Follow-up by: Rodney Langton MD,  August 07, 2009 5:39 PM    Prescriptions: DEMADEX 10 MG TABS (TORSEMIDE) One tab by mouth daily  #30 x 6   Entered and Authorized by:   Rodney Langton MD   Signed by:   Rodney Langton MD on 08/07/2009   Method used:   Electronically to        [facility]* (retail)       [address]       [place], [place]  [id number]       Ph: [phone]       Fax: [phone]   RxID:   [id number]   patient notified. Theresia Lo RN  August 08, 2009 10:29 AM

## 2010-03-19 NOTE — Assessment & Plan Note (Signed)
Summary: BP CHECK/KH  Nurse Visit Patient came in today to pick up pain medication and decided to have BP rechecked while he was here. BP check with large adult cuff. RA 154/100 and LA 152/96 pulse 80. He is taking the metoprolol as directed he states. However he has not taken meds today yet.  Contacted Dr. Benjamin Stain and he advises to have patient come back for BP again in 2 weeks. Continue meds the same  for now. Theresia Lo RN  March 20, 2009 10:00 AM   Allergies: 1)  ! Pcn 2)  ! Codeine 3)  ! Wellbutrin 4)  ! * Zyban 5)  ! Cipro 6)  ! Flagyl 7)  ! * Ppd 8)  ! * Tetanus 9)  ! * Iv Ig 10)  ! * Statins 11)  ! Lexapro (Escitalopram Oxalate) 12)  ! Paxil (Paroxetine Hcl) 13)  ! Prozac 14)  ! Advair Diskus (Fluticasone-Salmeterol) 15)  ! Protopic (Tacrolimus) 16)  ! Temovate E (Clobetasol Prop Emollient Base) 17)  ! Betamethasone Dipropionate (Betamethasone Dipropionate)  Orders Added: 1)  No Charge Patient Arrived (NCPA0) [NCPA0]

## 2010-03-19 NOTE — Progress Notes (Signed)
 Summary: phn msg  Phone Note Call from Patient   Caller: Patient Summary of Call: pt uable to come in today due to car trouble res to [facility] the 12th. Initial call taken by: Madelin Daring,  August 23, 2008 9:04 AM  Follow-up for Phone Call        Thanks, noted. Follow-up by: Debby Petties MD,  August 23, 2008 12:29 PM

## 2010-03-19 NOTE — Letter (Signed)
Summary: Probation Letter  [facility]  [address]   [place], [place] [address]   Phone: [phone]  Fax: [phone]    11/20/2009  Patrick Holt [address] [place], [place]  [address]  Dear [name],  With the goal of better serving all our patients the [facility] is following each patient's missed appointments.  You have missed at least 3 appointments with our practice.If you cannot keep your appointment, we expect you to call at least 24 hours before your appointment time.  Missing appointments prevents other patients from seeing [place] and makes it difficult to provide you with the best possible medical care.      1.   If you miss one more appointment, we will only give you limited medical services. This means we will not call in medication refills, complete a form, or make a referral for you except when you are here for a scheduled office visit.    2.   If you miss 2 or more appointments in the next year, we will dismiss you from our practice.    Our office staff can be reached at [phone] Monday through Friday from 8:30 a.m.-5:00 p.m. and will be glad to schedule your appointment as necessary.    Thank you.   [facility]

## 2010-03-19 NOTE — Assessment & Plan Note (Signed)
Summary: discuss meds, and back pain/ls   Vital Signs:  Patient profile:   60 year old male Height:      70 inches Temp:     97.9 degrees F oral Pulse rate:   64 / minute BP sitting:   120 / 74  (left arm) Cuff size:   regular  Vitals Entered By: Gladstone Pih (August 04, 2009 2:38 PM) CC: F/U Is Patient Diabetic? Yes Did you bring your meter with you today? No   Primary Care Provider:  Rodney Langton, MD  CC:  F/U.  History of Present Illness: Pt here for HFU, see DC summary.  Multiple complaints and requests.  Breathing:  S/p PNA, breathing well, no cough, no SOB.  Would like refill on combivent.  Walking:  Still refusing to be institutionalized for help.  Would like a walker and shower seat for home to improve his safety.  Is ambulatory.  Rash on buttocks:  Some itching.  HC 1% working, would like refill.  Benzo w/d seizure:  back on Xanax rx'ed by psych.  He will contact them for a refill.   Habits & Providers  Alcohol-Tobacco-Diet     Tobacco Status: quit     Tobacco Counseling: to quit use of tobacco products     Year Quit: ?  Current Medications (verified): 1)  Nexium 40 Mg Cpdr (Esomeprazole Magnesium) .... Take 1 Tablet By Mouth Once A Day 2)  Nitroglycerin 0.4 Mg Subl (Nitroglycerin) .... As Needed 3)  Methotrexate 2.5 Mg Tabs (Methotrexate Sodium) .... 3 Tabs By Mouth Q Weekly 4)  Alprazolam 1 Mg Tbdp (Alprazolam) .Marland Kitchen.. 1 Tablet Three Times A Day 5)  Cymbalta 60 Mg Cpep (Duloxetine Hcl) .Marland Kitchen.. 1 Capsule Two Times A Day 6)  Glucotrol Xl 5 Mg Xr24h-Tab (Glipizide) .Marland Kitchen.. 1 Tablet By Mouth Once Daily 7)  Prednisone 5 Mg Tabs (Prednisone) .Marland Kitchen.. 1 Tab By Mouth Once Daily 8)  Lisinopril-Hydrochlorothiazide 20-25 Mg Tabs (Lisinopril-Hydrochlorothiazide) .... One Half Tab By Mouth Daily 9)  Aspir-81 81 Mg Tbec (Aspirin) .... Once Daily 10)  Niaspan 1000 Mg Cr-Tabs (Niacin (Antihyperlipidemic)) .... One Tab By Mouth Qhs 11)  Ropinirole Hcl 2 Mg Tabs (Ropinirole Hcl)  .... One Tab By Mouth Tid 12)  Oxycodone Hcl 5 Mg Tabs (Oxycodone Hcl) .... One Tab By Mouth Q8h As Needed For Pain 13)  Nasonex 50 Mcg/act Susp (Mometasone Furoate) .... 2 Sprays in Each Nostril Two Times A Day. 14)  Oxybutynin Chloride 5 Mg Tabs (Oxybutynin Chloride) .... One Tab By Mouth Two Times A Day 15)  Antivert 25 Mg Tabs (Meclizine Hcl) .... One Tab By Mouth Q8h As Needed For Vertigo. 16)  Metoprolol Tartrate 50 Mg Tabs (Metoprolol Tartrate) .... One Tab By Mouth Bid 17)  Demadex 10 Mg Tabs (Torsemide) .... One Tab By Mouth Daily 18)  Folic Acid 1 Mg Tabs (Folic Acid) .... One Tab By Mouth On Days Not Taking Methotrexate. 19)  Potassium Chloride Crys Cr 20 Meq Cr-Tabs (Potassium Chloride Crys Cr) .... Two Tabs By Mouth Daily 20)  Promethazine Hcl 25 Mg Tabs (Promethazine Hcl) .... One Tab By Mouth Q6h As Needed For Nausea 21)  Combivent 18-103 Mcg/act Aero (Ipratropium-Albuterol) .... Oen To Two Puffs Q4h As Needed Sob/wheeze 22)  Hydrocortisone 2.5 % Oint (Hydrocortisone) .... Apply To Itchy Areas As Needed Two Times A Day 23)  Shower Chair .... Use As Needed 24)  Rolling Walker .... Use For Ambulation Out of Wheelchair.  Allergies (verified): 1)  ! Pcn  2)  ! Codeine 3)  ! Wellbutrin 4)  ! * Zyban 5)  ! Cipro 6)  ! Flagyl 7)  ! * Ppd 8)  ! * Tetanus 9)  ! * Iv Ig 10)  ! * Statins 11)  ! Lexapro (Escitalopram Oxalate) 12)  ! Paxil (Paroxetine Hcl) 13)  ! Prozac 14)  ! Advair Diskus (Fluticasone-Salmeterol) 15)  ! Protopic (Tacrolimus) 16)  ! Temovate E (Clobetasol Prop Emollient Base) 17)  ! Betamethasone Dipropionate (Betamethasone Dipropionate) 18)  ! Lasix  Social History: Smoking Status:  quit  Review of Systems       See HPI  Physical Exam  General:  Well-developed,well-nourished,in no acute distress; alert,appropriate and cooperative throughout examination Lungs:  Normal respiratory effort, chest expands symmetrically. Lungs are clear to auscultation, no  crackles or wheezes. Heart:  Normal rate and regular rhythm. S1 and S2 normal without gallop, murmur, click, rub or other extra sounds. Extremities:  No clubbing, cyanosis, edema, or deformity noted with normal full range of motion of all joints.     Impression & Recommendations:  Problem # 1:  HYPOKALEMIA, MILD (ICD-276.8) Assessment Unchanged On loop, checking BMET today.  Orders: Basic Met-[facility] ([id number]) [facility]- Est  Level 4 ([address])  Problem # 2:  ACCIDENTAL FALL (ICD-E888.9) Assessment: Unchanged Unstable on his feet but ambulatory, I think a rolling walker and shower seat would be beneficial for him.  Maybe he will walk more (and get stronger) if his confidence improves with a walker.  Pt will not agree to [facility].  Orders: [facility]- Est  Level 4 ([address])  Problem # 3:  HYPERTENSION (ICD-401.9) Assessment: Improved Controlled.  His updated medication list for this problem includes:    Lisinopril-hydrochlorothiazide 20-25 Mg Tabs (Lisinopril-hydrochlorothiazide) ..... One half tab by mouth daily    Metoprolol Tartrate 50 Mg Tabs (Metoprolol tartrate) ..... One tab by mouth bid    Demadex 10 Mg Tabs (Torsemide) ..... One tab by mouth daily  Orders: [facility]- Est  Level 4 ([address])  Problem # 4:  COPD (ICD-496) Assessment: Improved Refilled combivent.  His updated medication list for this problem includes:    Combivent 18-103 Mcg/act Aero (Ipratropium-albuterol) ..... Oen to two puffs q4h as needed sob/wheeze  Complete Medication List: 1)  Nexium 40 Mg Cpdr (Esomeprazole magnesium) .... Take 1 tablet by mouth once a day 2)  Nitroglycerin 0.4 Mg Subl (Nitroglycerin) .... As needed 3)  Methotrexate 2.5 Mg Tabs (Methotrexate sodium) .... 3 tabs by mouth q weekly 4)  Alprazolam 1 Mg Tbdp (Alprazolam) .Marland Kitchen.. 1 tablet three times a day 5)  Cymbalta 60 Mg Cpep (Duloxetine hcl) .Marland Kitchen.. 1 capsule two times a day 6)  Glucotrol Xl 5 Mg Xr24h-tab (Glipizide) .Marland Kitchen.. 1 tablet by mouth once daily 7)   Prednisone 5 Mg Tabs (Prednisone) .Marland Kitchen.. 1 tab by mouth once daily 8)  Lisinopril-hydrochlorothiazide 20-25 Mg Tabs (Lisinopril-hydrochlorothiazide) .... One half tab by mouth daily 9)  Aspir-81 81 Mg Tbec (Aspirin) .... Once daily 10)  Niaspan 1000 Mg Cr-tabs (Niacin (antihyperlipidemic)) .... One tab by mouth qhs 11)  Ropinirole Hcl 2 Mg Tabs (Ropinirole hcl) .... One tab by mouth tid 12)  Oxycodone Hcl 5 Mg Tabs (Oxycodone hcl) .... One tab by mouth q8h as needed for pain 13)  Nasonex 50 Mcg/act Susp (Mometasone furoate) .... 2 sprays in each nostril two times a day. 14)  Oxybutynin Chloride 5 Mg Tabs (Oxybutynin chloride) .... One tab by mouth two times a day 15)  Antivert 25 Mg  Tabs (Meclizine hcl) .... One tab by mouth q8h as needed for vertigo. 16)  Metoprolol Tartrate 50 Mg Tabs (Metoprolol tartrate) .... One tab by mouth bid 17)  Demadex 10 Mg Tabs (Torsemide) .... One tab by mouth daily 18)  Folic Acid 1 Mg Tabs (Folic acid) .... One tab by mouth on days not taking methotrexate. 19)  Potassium Chloride Crys Cr 20 Meq Cr-tabs (Potassium chloride crys cr) .... Two tabs by mouth daily 20)  Promethazine Hcl 25 Mg Tabs (Promethazine hcl) .... One tab by mouth q6h as needed for nausea 21)  Combivent 18-103 Mcg/act Aero (Ipratropium-albuterol) .... Oen to two puffs q4h as needed sob/wheeze 22)  Hydrocortisone 2.5 % Oint (Hydrocortisone) .... Apply to itchy areas as needed two times a day 23)  Paediatric nurse  .... Use as needed 24)  Rolling Walker  .... Use for ambulation out of wheelchair.  Patient Instructions: 1)  Checking your electrolytes. 2)  Scripts for rolling walker and shower seat. 3)  Refilled hydrocotisone at a higher strength. 4)  Come back to see me in 6 months. 5)  -Dr. Karie Schwalbe. Prescriptions: Levan Hurst Use for ambulation out of wheelchair.  #1 x 0   Entered and Authorized by:   Rodney Langton MD   Signed by:   Rodney Langton MD on 08/04/2009   Method used:   Print  then Give to Patient   RxID:   [id number] [facility] Use as needed  #1 x 0   Entered and Authorized by:   Rodney Langton MD   Signed by:   Rodney Langton MD on 08/04/2009   Method used:   Print then Give to Patient   RxID:   [id number] HYDROCORTISONE 2.5 % OINT (HYDROCORTISONE) Apply to itchy areas as needed two times a day  #60 gm tube x 0   Entered and Authorized by:   Rodney Langton MD   Signed by:   Rodney Langton MD on 08/04/2009   Method used:   Print then Give to Patient   RxID:   [id number] COMBIVENT 18-103 MCG/ACT AERO (IPRATROPIUM-ALBUTEROL) Oen to two puffs q4h as needed sob/wheeze  #1 x 6   Entered and Authorized by:   Rodney Langton MD   Signed by:   Rodney Langton MD on 08/04/2009   Method used:   Print then Give to Patient   RxID:   [id number]

## 2010-03-19 NOTE — Assessment & Plan Note (Signed)
 Summary: FU/KH   Vital Signs:  Patient profile:   60 year old male Height:      70 inches Weight:      244 pounds BMI:     35.14 BP sitting:   112 / 80  Vitals Entered By: Giovanna Ada CMA (November 25, 2008 2:15 PM)  Primary Care Provider:  Debby Petties, MD   History of Present Illness: 43M with MMP fu fall, ST.  Fall:  Addressed at last visit, XR normal, better per pt.  ST:  Present almost 30 days now, feels that throat is raw, now tongue raw as well.  No fevers/chills/V/N/D/C.  no swollen glands.  No sick contacts, no hx GC/Chlam.  Feels like tongue sticks to roof of mouth.  No diarrhea, no other signs mucosal irritation.  Pt had multiple other complaints as he usually does but agrees to discuss them at a later visit.  Also req some rehab to help him get out of wheelchair.  Very weak, claims he has had several falls during transfer.  Allergies: 1)  ! Pcn 2)  ! Codeine 3)  ! Wellbutrin 4)  ! * Zyban 5)  ! Cipro 6)  ! Flagyl 7)  ! * Ppd 8)  ! * Tetanus 9)  ! * Iv Ig 10)  ! * Statins 11)  ! Lexapro (Escitalopram Oxalate) 12)  ! Paxil (Paroxetine Hcl) 13)  ! Prozac 14)  ! Advair Diskus (Fluticasone -Salmeterol) 15)  ! Protopic (Tacrolimus) 16)  ! Temovate E (Clobetasol Prop Emollient Base) 17)  ! 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) FIBROMYALGIA (ICD-729.1) VERTIGO (ICD-780.4) TREMOR, RIGHT HAND (ICD-781.0) SPECIAL SCREENING MALIGNANT NEOPLASM OF PROSTATE (ICD-V76.44) GASTRITIS, HX OF (ICD-V12.79) HEMORRHOIDS, INTERNAL  (ICD-455.0) DIVERTICULITIS, HX OF (ICD-V12.79) ANXIETY, CHRONIC (ICD-300.00) DIVERTICULOSIS OF COLON (ICD-562.10) COLONIC POLYPS, ADENOMATOUS, HX OF (ICD-V12.72) NEPHROLITHIASIS, HX OF (ICD-V13.01) ANEMIA, HX OF (ICD-V12.3) Hx of ESOPHAGEAL STRICTURE (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) DERMATOMYOSITIS (ICD-710.3) ARTHRITIS (ICD-716.90) RHINITIS (ICD-472.0) GENERALIZED PAIN (ICD-780.96)   DERMATOMYOSITIS (ICD-710.3) ARTHRITIS (ICD-716.90) GERD (ICD-530.81) HYPERTENSION (ICD-401.9) HYPERLIPIDEMIA (ICD-272.4) Hx MI 1999, 2000, 2004  Past Surgical History: Last updated: 01/26/2008 knee surgery for meniscal tear lumbar diskectomy  Family History: Last updated: 05/31/2008 Mother: Brain Tumor, Heart disease, stroke, DM, ETOH addiction Biological father: unknown  Half Brother: DM, Obesity  Social History: Last updated: 05/31/2008 Occupation: Disabled, Wheelchair Bound Patient currently smokes.  Alcohol  Use - yes red wine everyday Illicit Drug Use - no Pt lives alone.  No help at home.  Does ADLs himself.  Review of Systems       See HPI  Physical Exam  General:  Well-developed,well-nourished,in no acute distress; alert,appropriate and cooperative throughout examination Eyes:  No corneal or conjunctival inflammation noted. EOMI. Perrla. Mouth:  Tongue beefy red, appearance of raw ground beef, throat reddish.  No LAD, no tenderness on neck. Neck:  No deformities, masses, or tenderness noted. Lungs:  Normal respiratory effort, chest expands symmetrically. Lungs are clear to auscultation, no crackles or wheezes. Heart:  Normal rate and regular rhythm. S1 and S2 normal without gallop, murmur, click, rub or other extra sounds. Msk:  Very weak 3/5 proximal muscles in both upper an lower ext, in motorized chair with joystick, has cane when he does transfer.  Not ambulatory today.  Additional Exam:  KOH of tongue with multiple large budding yeasts   Impression  & Recommendations:  Problem # 1:  GLOSSITIS (ICD-529.0) Assessment New Budding yeasts on tongue, likely candidal glossitis.  Pt on methotrexate  so must also consider folate deficiency.  Other causes include B vitamin deficiency, HIV.  Will treat with Nystatin  swish and swallow QID until 48h after symptoms resolve.  Holding methotrexate  for now, return to Dr. Lenon Rheum ASAP (pt states he will make appt as soon as he can but has no money to pay copay).  Will check CBC, HIV, B12, Folate levels.  Also will give folic acid , b6, b12 vitamin supplement two times a day.  RTC to see me 4 weeks to assess response.  Orders: CBC w/Diff-[facility] (14974) Folate-[facility] [phone]) B12-[facility] [phone]) HIV-[facility] ([id number]) KOH-[facility] (12779) [facility]- Est  Level 4 (00785)  Problem # 2:  SORE THROAT (ICD-462) Assessment: Unchanged See #1.  His updated medication list for this problem includes:    Aspir-81 81 Mg Tbec (Aspirin ) ..... Once daily    Nystatin  100000 Unit/ml Susp (Nystatin ) .SABRASABRASABRASABRA 6ml by mouth, swish, keep in mouth as long as possible, then swallow.  do this 4x/day.  continue at least 48h after symptoms resolve.  Orders: [facility]- Est  Level 4 (00785)  Problem # 3:  ACCIDENTAL FALL (ICD-E888.9) Assessment: Unchanged Pt debilitated, needs rehab, referral for outpt rehab made.  Orders: [facility]- Est  Level 4 (00785) Rehabilitation Referral (Rehab)  Problem # 4:  DERMATOMYOSITIS (ICD-710.3) Assessment: Unchanged Holding methotrexate  for now as this may be causing his oral symptoms.  Pt to get in to see Rheumatology, Dr. Lenon ASAP.  Orders: [facility]- Est  Level 4 (00785) Rehabilitation Referral (Rehab)  Complete Medication List: 1)  Nexium  40 Mg Cpdr (Esomeprazole  magnesium ) .... Take 1 tablet by mouth once a day 2)  Nitroglycerin  0.4 Mg Subl (Nitroglycerin ) .... As needed 3)  Methotrexate  2.5 Mg Tabs (Methotrexate  sodium) .... 5 tabs by mouth q weekly 4)  Alprazolam  1 Mg Tbdp (Alprazolam ) .SABRA.. 1  tablet three times a day 5)  Cymbalta  60 Mg Cpep (Duloxetine  hcl) .SABRA.. 1 capsule two times a day 6)  Glucotrol  Xl 5 Mg Xr24h-tab (Glipizide ) .SABRA.. 1 tablet by mouth once daily 7)  Prednisone  5 Mg Tabs (Prednisone ) .... 2 tablets by mouth once daily 8)  Lisinopril -hydrochlorothiazide  20-25 Mg Tabs (Lisinopril -hydrochlorothiazide ) .... Once daily 9)  Aspir-81 81 Mg Tbec (Aspirin ) .... Once daily 10)  Niaspan  1000 Mg Cr-tabs (Niacin  (antihyperlipidemic)) .... One tab by mouth qhs 11)  Ropinirole  Hcl 1 Mg Tabs (Ropinirole  hcl) .... Two tabs by mouth three times a day 12)  Oxycodone  Hcl 5 Mg Tabs (Oxycodone  hcl) .... One tab by mouth q8h as needed for pain 13)  Nasonex  50 Mcg/act Susp (Mometasone  furoate) .... 2 sprays in each nostril two times a day. 14)  Oxybutynin  Chloride 5 Mg Tabs (Oxybutynin  chloride) .... One tab by mouth two times a day 15)  Lidocaine  5 % Oint (Lidocaine ) .... Apply to painful area on nipple and occlude with teraderm/saran wrap x 2 hours then wash off 16)  Nystatin  100000 Unit/ml Susp (Nystatin ) .... 6ml by mouth, swish, keep in mouth as long as possible, then swallow.  do this 4x/day.  continue at least 48h after symptoms resolve. 17)  B Complex-folic Acid  500-5-200 Mcg-mg-mcg Tabs (Folic acid -vit b6-vit b12) .... Two tabs by mouth daily  Patient Instructions: 1)  Great to see you today, 2)  Your tongue and throat  symptoms are likely due to a fungal infection confirmed on the swab we did today. 3)  I want you to use a nystatin  swish and swallow 6mL, swish and keep in mouth as long as possible 4x a day.  Continue to use at least 48h after all your symptoms have resolved. 4)  Hold the methotrexate  for now. 5)  Take Folic acid  vitamins. 6)  Come back to see me in 4 weeks. 7)  -Dr. ONEIDA. Prescriptions: B COMPLEX-FOLIC ACID  500-5-200 MCG-MG-MCG TABS (FOLIC ACID -VIT B6-VIT B12) Two tabs by mouth daily  #90 x 6   Entered and Authorized by:   Debby Petties MD   Signed by:    Debby Petties MD on 11/25/2008   Method used:   Electronically to        [facility]* (retail)       [address]       [place], [place]  [id number]       Ph: [phone]       Fax: [phone]   RxID:   [id number] NYSTATIN  100000 UNIT/ML SUSP (NYSTATIN ) 6mL by mouth, swish, keep in mouth as long as possible, then swallow.  Do this 4x/day.  Continue at least 48h after symptoms resolve.  #1 bottle x 6   Entered and Authorized by:   Debby Petties MD   Signed by:   Debby Petties MD on 11/25/2008   Method used:   Electronically to        [facility]* (retail)       [address]       [place], [place]  [id number]       Ph: [phone]       Fax: [phone]   RxID:   [id number]   Last Colonoscopy:  Location:  [facility]   (02/29/2008 1:55:12 PM) Colonoscopy Next Due:  10 yr   Laboratory Results  Date/Time Received: November 25, 2008 3:00 PM  Date/Time Reported: November 25, 2008 3:47 PM   Other Tests  Skin KOH: Positive  - oral  Comments: ...............test performed by......SABRABonnie A. Jordan, [place] (ASCP)

## 2010-03-19 NOTE — Progress Notes (Signed)
Summary: phn msg  Phone Note Call from Patient Call back at Home Phone [phone]   Caller: Patient Summary of Call: Please have Dr. Benjamin Stain call him about his medications.  Initial call taken by: Clydell Hakim,  Jul 07, 2009 2:52 PM  Follow-up for Phone Call        to PCP Follow-up by: Gladstone Pih,  Jul 07, 2009 3:13 PM  Additional Follow-up for Phone Call Additional follow up Details #1::        rec'd a call from another doctor about his meds and he is upset. Additional Follow-up by: De Nurse,  Jul 07, 2009 5:01 PM    Additional Follow-up for Phone Call Additional follow up Details #2::    spoke with pt , states he told you you need to find him another dr states they do not do the lab test and can not even spell his disease and they want him to come off his meds due to CPK levels being normal..Also wants his RX written so he can pick then up on Thur when he comes into the office for lab work since you did not give them to him last visit. Oxycodone...to PCP Follow-up by: Gladstone Pih,  Jul 07, 2009 5:08 PM  Additional Follow-up for Phone Call Additional follow up Details #3:: Details for Additional Follow-up Action Taken: There are no other rheumatologists in the area that will take him, he needs to listen to them and stop meds if they say so.  Also refilled oxycodone, will be in envelope at front.  Additional Follow-up by: Rodney Langton MD,  Jul 08, 2009 2:14 PM  Prescriptions: OXYCODONE HCL 5 MG TABS (OXYCODONE HCL) One tab by mouth q8h as needed for pain  #180 x 0   Entered and Authorized by:   Rodney Langton MD   Signed by:   Rodney Langton MD on 07/08/2009   Method used:   Print then Give to Patient   RxID:   [id number]  Pt notified.Gladstone Pih  Jul 08, 2009 2:21 PM

## 2010-03-19 NOTE — Assessment & Plan Note (Signed)
Summary: rsch'd f rom 11/20/09/[facility]   Vital Signs:  Patient profile:   60 year old male Temp:     99.0 degrees F Pulse rate:   67 / minute BP sitting:   172 / 94  Vitals Entered By: Jone Baseman CMA (December 11, 2009 2:54 PM) CC: f/u Is Patient Diabetic? No Pain Assessment Patient in pain? no        Primary Care Provider:  Rodney Langton, MD  CC:  f/u.  History of Present Illness: 60 yo male here to discuss bladder, dismissal letter, dermatomyositis.  Bladder:  On oxybutinin but claims this is not working, he doesn't get spasm, just wets himself occasionally.  No dysuria or polyuria.  No fevers/chills.  Dismissal letter:  He received a probation letter for >3 missed appts in 1 year recently.   Informed him that should he need to miss an appt due to rain (cannot use electric wheelchair in the rain) then he should just call to let us know.  2 more missed appts and he would be dismissed from practice.  Dermatomyositis:  He needs refill on prednisone.  Doesn't want to see the only rheumatologist in town that will see him due to a personality clash.  Wants me to manage his dermatomyositis and methotrexate.  Habits & Providers  Alcohol-Tobacco-Diet     Tobacco Status: quit     Tobacco Counseling: to quit use of tobacco products     Cigarette Packs/Day: <0.25     Year Quit: ?  Current Medications (verified): 1)  Nexium 40 Mg Cpdr (Esomeprazole Magnesium) .... Take 1 Tablet By Mouth Once A Day 2)  Nitroglycerin 0.4 Mg Subl (Nitroglycerin) .... As Needed 3)  Methotrexate 2.5 Mg Tabs (Methotrexate Sodium) .... 3 Tabs By Mouth Q Weekly 4)  Alprazolam 1 Mg Tbdp (Alprazolam) .Marland Kitchen.. 1 Tablet Three Times A Day 5)  Cymbalta 60 Mg Cpep (Duloxetine Hcl) .Marland Kitchen.. 1 Capsule Two Times A Day 6)  Glucotrol Xl 5 Mg Xr24h-Tab (Glipizide) .Marland Kitchen.. 1 Tablet By Mouth Once Daily 7)  Prednisone 5 Mg Tabs (Prednisone) .Marland Kitchen.. 1 Tab By Mouth Once Daily 8)  Lisinopril-Hydrochlorothiazide 20-25 Mg Tabs  (Lisinopril-Hydrochlorothiazide) .... One Half Tab By Mouth Daily 9)  Aspir-81 81 Mg Tbec (Aspirin) .... Once Daily 10)  Niaspan 1000 Mg Cr-Tabs (Niacin (Antihyperlipidemic)) .... One Tab By Mouth Qhs 11)  Ropinirole Hcl 2 Mg Tabs (Ropinirole Hcl) .... One Tab By Mouth Tid 12)  Oxycodone Hcl 5 Mg Tabs (Oxycodone Hcl) .... One Tab By Mouth Q8h As Needed For Pain 13)  Nasonex 50 Mcg/act Susp (Mometasone Furoate) .... 2 Sprays in Each Nostril Two Times A Day. 14)  Oxybutynin Chloride 5 Mg Tabs (Oxybutynin Chloride) .... One Tab By Mouth Two Times A Day 15)  Antivert 25 Mg Tabs (Meclizine Hcl) .... One Tab By Mouth Q8h As Needed For Vertigo. 16)  Metoprolol Tartrate 50 Mg Tabs (Metoprolol Tartrate) .... One Tab By Mouth Bid 17)  Demadex 10 Mg Tabs (Torsemide) .... One Tab By Mouth Daily 18)  Folic Acid 1 Mg Tabs (Folic Acid) .... One Tab By Mouth On Days Not Taking Methotrexate. 19)  Potassium Chloride Crys Cr 20 Meq Cr-Tabs (Potassium Chloride Crys Cr) .... Two Tabs By Mouth Daily 20)  Promethazine Hcl 25 Mg Tabs (Promethazine Hcl) .... One Tab By Mouth Q6h As Needed For Nausea 21)  Combivent 18-103 Mcg/act Aero (Ipratropium-Albuterol) .... Oen To Two Puffs Q4h As Needed Sob/wheeze 22)  Hydrocortisone 2.5 % Oint (Hydrocortisone) .... Apply  To Itchy Areas As Needed Two Times A Day 23)  Shower Chair .... Use As Needed 24)  Rolling Walker .... Use For Ambulation Out of Wheelchair.  Allergies (verified): 1)  ! Pcn 2)  ! Codeine 3)  ! Wellbutrin 4)  ! * Zyban 5)  ! Cipro 6)  ! Flagyl 7)  ! * Ppd 8)  ! * Tetanus 9)  ! * Iv Ig 10)  ! * Statins 11)  ! Lexapro (Escitalopram Oxalate) 12)  ! Paxil (Paroxetine Hcl) 13)  ! Prozac 14)  ! Advair Diskus (Fluticasone-Salmeterol) 15)  ! Protopic (Tacrolimus) 16)  ! Temovate E (Clobetasol Prop Emollient Base) 17)  ! Betamethasone Dipropionate (Betamethasone Dipropionate) 18)  ! Lasix  Review of Systems       See HPI  Physical Exam  General:   Well-developed,well-nourished,in no acute distress; alert,appropriate and cooperative throughout examination Lungs:  Normal respiratory effort, chest expands symmetrically. Lungs are clear to auscultation, no crackles or wheezes. Heart:  Normal rate and regular rhythm. S1 and S2 normal without gallop, murmur, click, rub or other extra sounds.   Impression & Recommendations:  Problem # 1:  HYPERTENSION (ICD-401.9) Assessment Deteriorated BP elevated today but has been well controlled previously. No changes unless cont to be elevated.  His updated medication list for this problem includes:    Lisinopril-hydrochlorothiazide 20-25 Mg Tabs (Lisinopril-hydrochlorothiazide) ..... One half tab by mouth daily    Metoprolol Tartrate 50 Mg Tabs (Metoprolol tartrate) ..... One tab by mouth bid    Demadex 10 Mg Tabs (Torsemide) ..... One tab by mouth daily  Orders: [facility]- Est  Level 4 (04540)  Problem # 2:  DIABETES MELLITUS, TYPE II (ICD-250.00) Assessment: Unchanged Due for A1c and urine microalbumin. A1c is in the normal range today, I think his high CBGs may just be 2/2 his prednisone use.   We can probable remove DM2 from his problem list after checking a 75g glucose tolerance test. Will do this next time he comes back.  His updated medication list for this problem includes:    Glucotrol Xl 5 Mg Xr24h-tab (Glipizide) .Marland Kitchen... 1 tablet by mouth once daily    Lisinopril-hydrochlorothiazide 20-25 Mg Tabs (Lisinopril-hydrochlorothiazide) ..... One half tab by mouth daily    Aspir-81 81 Mg Tbec (Aspirin) ..... Once daily  Orders: A1C-[facility] (98119) [facility]- Est  Level 4 (99214) Urinalysis-[facility] (00000)  Problem # 3:  DERMATOMYOSITIS (ICD-710.3) Assessment: Unchanged Refilled prednisone.   Advised that though there was a personality conflict he doesn't have other options for a rheumatologist and that if he wanted his dermatomyositis treated, he would have to cont to see Dr. Kellie Simmering.  Problem # 4:  NEED  PROPHYLACTIC VACCINATION&INOCULATION FLU (ICD-V04.81) Assessment: Comment Only Flu shot given.  Complete Medication List: 1)  Nexium 40 Mg Cpdr (Esomeprazole magnesium) .... Take 1 tablet by mouth once a day 2)  Nitroglycerin 0.4 Mg Subl (Nitroglycerin) .... As needed 3)  Methotrexate 2.5 Mg Tabs (Methotrexate sodium) .... 3 tabs by mouth q weekly 4)  Alprazolam 1 Mg Tbdp (Alprazolam) .Marland Kitchen.. 1 tablet three times a day 5)  Cymbalta 60 Mg Cpep (Duloxetine hcl) .Marland Kitchen.. 1 capsule two times a day 6)  Glucotrol Xl 5 Mg Xr24h-tab (Glipizide) .Marland Kitchen.. 1 tablet by mouth once daily 7)  Prednisone 5 Mg Tabs (Prednisone) .Marland Kitchen.. 1 tab by mouth once daily 8)  Lisinopril-hydrochlorothiazide 20-25 Mg Tabs (Lisinopril-hydrochlorothiazide) .... One half tab by mouth daily 9)  Aspir-81 81 Mg Tbec (Aspirin) .... Once daily 10)  Niaspan 1000 Mg Cr-tabs (Niacin (antihyperlipidemic)) .... One tab by mouth qhs 11)  Ropinirole Hcl 2 Mg Tabs (Ropinirole hcl) .... One tab by mouth tid 12)  Oxycodone Hcl 5 Mg Tabs (Oxycodone hcl) .... One tab by mouth q8h as needed for pain 13)  Nasonex 50 Mcg/act Susp (Mometasone furoate) .... 2 sprays in each nostril two times a day. 14)  Oxybutynin Chloride 5 Mg Tabs (Oxybutynin chloride) .... One tab by mouth two times a day 15)  Antivert 25 Mg Tabs (Meclizine hcl) .... One tab by mouth q8h as needed for vertigo. 16)  Metoprolol Tartrate 50 Mg Tabs (Metoprolol tartrate) .... One tab by mouth bid 17)  Demadex 10 Mg Tabs (Torsemide) .... One tab by mouth daily 18)  Folic Acid 1 Mg Tabs (Folic acid) .... One tab by mouth on days not taking methotrexate. 19)  Potassium Chloride Crys Cr 20 Meq Cr-tabs (Potassium chloride crys cr) .... Two tabs by mouth daily 20)  Promethazine Hcl 25 Mg Tabs (Promethazine hcl) .... One tab by mouth q6h as needed for nausea 21)  Combivent 18-103 Mcg/act Aero (Ipratropium-albuterol) .... Oen to two puffs q4h as needed sob/wheeze 22)  Hydrocortisone 2.5 % Oint  (Hydrocortisone) .... Apply to itchy areas as needed two times a day 23)  Paediatric nurse  .... Use as needed 24)  Rolling Walker  .... Use for ambulation out of wheelchair.  Other Orders: Flu Vaccine 54yrs + (16109) Admin 1st Vaccine (60454)  Patient Instructions: 1)  Good to see you, 2)  Will refill your prednisone. 3)  Checking A1c, urine. 4)  Flu shot. 5)  Call in the morning if you cannot make an appt. 6)  Be sure to see Dr. Kellie Simmering. 7)  Come back to see me as needed. 8)  -Dr. Karie Schwalbe.  Prescriptions: PREDNISONE 5 MG TABS (PREDNISONE) 1 tab by mouth once daily  #30 x 11   Entered and Authorized by:   Rodney Langton MD   Signed by:   Rodney Langton MD on 12/11/2009   Method used:   Electronically to        [facility]* (retail)       [address]       [place], [place]  [id number]       Ph: [phone]       Fax: [phone]   RxID:   [id number]    Orders Added: 1)  A1C-[facility] [83036] 2)  [facility]- Est  Level 4 [36644] 3)  Urinalysis-[facility] [00000] 4)  Flu Vaccine 40yrs + [03474] 5)  Admin 1st Vaccine [25956]   Immunizations Administered:  Influenza Vaccine # 1:    Vaccine Type: Fluvax 3+    Site: left deltoid    Mfr: GlaxoSmithKline    Dose: 0.5 ml    Route: IM    Given by: Garen Grams LPN    Exp. Date: 08/12/2010    Lot #: [id number]    VIS given: 09/09/09 version given December 11, 2009.  Flu Vaccine Consent Questions:    Do you have a history of severe allergic reactions to this vaccine? no    Any prior history of allergic reactions to egg and/or gelatin? no    Do you have a sensitivity to the preservative Thimersol? no    Do you have a past history of Guillan-Barre Syndrome? no    Do you currently have an acute febrile illness? no    Have you ever had a severe reaction to latex? no  Vaccine information given and explained to patient? yes   Immunizations Administered:  Influenza Vaccine # 1:    Vaccine Type: Fluvax 3+    Site: left  deltoid    Mfr: GlaxoSmithKline    Dose: 0.5 ml    Route: IM    Given by: Garen Grams LPN    Exp. Date: 08/12/2010    Lot #: [id number]    VIS given: 09/09/09 version given December 11, 2009.  Last Creatinine:  0.79 (08/04/2009 8:12:00 PM) Creatinine Next Due: 1 yr Last Potassium:  3.6 (08/04/2009 8:12:00 PM) Potassium Next Due:  1 yr   Laboratory Results   Urine Tests  Date/Time Received: December 11, 2009 3:48 PM  Date/Time Reported: December 11, 2009 3:49 PM   Routine Urinalysis   Color: yellow Appearance: Clear Glucose: negative   (Normal Range: Negative) Bilirubin: negative   (Normal Range: Negative) Ketone: negative   (Normal Range: Negative) Spec. Gravity: 1.020   (Normal Range: 1.003-1.035) Blood: negative   (Normal Range: Negative) pH: 8.5   (Normal Range: 5.0-8.0) Protein: negative   (Normal Range: Negative) Urobilinogen: 2.0   (Normal Range: 0-1) Nitrite: negative   (Normal Range: Negative) Leukocyte Esterace: negative   (Normal Range: Negative)    Comments: ...........test performed by...........Marland KitchenTerese Door, CMA   Blood Tests   Date/Time Received: December 11, 2009 3:48 PM  Date/Time Reported: December 11, 2009 3:51 PM   HGBA1C: 5.3%   (Normal Range: Non-Diabetic - 3-6%   Control Diabetic - 6-8%)  Comments: .......test performed by........Marland Kitchen Garen Grams, LPN entered by Terese Door, CMA

## 2010-03-19 NOTE — Letter (Signed)
 Summary: *Referral Letter; Rheumatology  [facility]  [address]   [place], [place] [address]   Phone: [phone]  Fax: [phone]    02/12/2009  Thank you in advance for agreeing to see my patient Dr. Everlean.  Patrick Holt [address] [place], [place]  [address]  Phone: [phone]  Reason for Referral: This patient is a 60 year old male who has a history of Dermatomyositis, is on methotrexate  and prednisone , his symptoms have been well controlled now for several years.  He has been seen by Dr. Lenon in the past but endorses that his insurance will no longer cover visits with Dr. Lenon but will cover visits with you.  He needs a rheumatologist to follow his condition.  Current Medications: 1)  NEXIUM  40 MG CPDR (ESOMEPRAZOLE  MAGNESIUM ) Take 1 tablet by mouth once a day 2)  NITROGLYCERIN  0.4 MG SUBL (NITROGLYCERIN ) as needed 3)  METHOTREXATE  2.5 MG TABS (METHOTREXATE  SODIUM) 5 tabs by mouth q weekly 4)  ALPRAZOLAM  1 MG TBDP (ALPRAZOLAM ) 1 tablet three times a day 5)  CYMBALTA  60 MG CPEP (DULOXETINE  HCL) 1 capsule two times a day 6)  GLUCOTROL  XL 5 MG XR24H-TAB (GLIPIZIDE ) 1 tablet by mouth once daily 7)  PREDNISONE  5 MG TABS (PREDNISONE ) 2 tablets by mouth once daily 8)  LISINOPRIL -HYDROCHLOROTHIAZIDE  20-25 MG TABS (LISINOPRIL -HYDROCHLOROTHIAZIDE ) once daily 9)  ASPIR-81 81 MG TBEC (ASPIRIN ) once daily 10)  NIASPAN  1000 MG CR-TABS (NIACIN  (ANTIHYPERLIPIDEMIC)) One tab by mouth qHS 11)  ROPINIROLE  HCL 2 MG TABS (ROPINIROLE  HCL) One tab by mouth TID 12)  OXYCODONE  HCL 5 MG TABS (OXYCODONE  HCL) One tab by mouth q8h as needed for pain 13)  NASONEX  50 MCG/ACT SUSP (MOMETASONE  FUROATE) 2 sprays in each nostril two times a day. 14)  OXYBUTYNIN  CHLORIDE 5 MG TABS (OXYBUTYNIN  CHLORIDE) One tab by mouth two times a day 15)  NYSTATIN  100000 UNIT/ML SUSP (NYSTATIN ) 6mL by mouth, swish, keep in mouth as long as possible, then swallow.  Do this 4x/day.   Continue at least 48h after symptoms resolve. 16)  ANTIVERT  25 MG TABS (MECLIZINE  HCL) One tab by mouth q8h as needed for vertigo. 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) FIBROMYALGIA (ICD-729.1) VERTIGO (ICD-780.4) TREMOR, RIGHT HAND (ICD-781.0) GASTRITIS, HX OF (ICD-V12.79) HEMORRHOIDS, INTERNAL (ICD-455.0) DIVERTICULITIS, HX OF (ICD-V12.79) ANXIETY, CHRONIC (ICD-300.00) COLONIC POLYPS, ADENOMATOUS, HX OF (ICD-V12.72) NEPHROLITHIASIS, HX OF (ICD-V13.01) ANEMIA, HX OF (ICD-V12.3) 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  Sincerely,  Debby Petties MD

## 2010-03-19 NOTE — Assessment & Plan Note (Signed)
 Summary: FU PER DR T/KH   Vital Signs:  Patient profile:   60 year old male Temp:     97.9 degrees F Pulse rate:   97 / minute BP sitting:   108 / 67  (left arm)  Vitals Entered By: Donzell Ip RN (August 27, 2008 1:31 PM) CC: suture removal Is Patient Diabetic? Yes   Primary Care Provider:  Debby Petties, MD  CC:  suture removal.  History of Present Illness: 63M with MMP here for removal of stitches, fu of his tremor, c/o rhinitis, and refill for his Niacin .  Stitches:  Had punch bx of left superficial breast lesion, Pathology showed normal skin.  He feels it is still there and painful.  Pt missed his past 2 appts 2/2 car trouble and rain.  Stitches removed today.  Tremor:  Felt that it was significantly better since starting ropinirole  but since has gotten slightly worse.  Tolerating 1mg  three times a day.    Rhinitis:  Complains that his nose is always running, itchy, drips when he tries to eat, as well as other times during the day.  No fevers/chills/cough, SOB.  Used oxymetazolone in the past that helped.  Has used a nasal steroid spray in the past as well but doesn't remember which one.  Would like refill of his niacin  to rx strength.  No flushing.  Allergies: 1)  ! Pcn 2)  ! Codeine 3)  ! Wellbutrin 4)  ! * Zyban 5)  ! Cipro 6)  ! Flagyl 7)  ! * Ppd 8)  ! * Tetanus 9)  ! * Iv Ig 10)  ! * Statins 11)  ! Lexapro (Escitalopram Oxalate) 12)  ! Paxil (Paroxetine Hcl) 13)  ! Prozac (Fluoxetine Hcl) 14)  ! Advair Diskus (Fluticasone -Salmeterol) 15)  ! Protopic (Tacrolimus) 16)  ! Temovate E (Clobetasol Prop Emollient Base) 17)  ! 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) FIBROMYALGIA (ICD-729.1) VERTIGO (ICD-780.4) TREMOR, RIGHT HAND (ICD-781.0) SPECIAL SCREENING MALIGNANT NEOPLASM OF PROSTATE (ICD-V76.44) GASTRITIS, HX OF (ICD-V12.79) HEMORRHOIDS, INTERNAL (ICD-455.0) DIVERTICULITIS, HX OF (ICD-V12.79) ANXIETY, CHRONIC (ICD-300.00) DIVERTICULOSIS OF COLON (ICD-562.10) COLONIC POLYPS, ADENOMATOUS, HX OF (ICD-V12.72) NEPHROLITHIASIS, HX OF (ICD-V13.01) ANEMIA, HX OF (ICD-V12.3) Hx of ESOPHAGEAL STRICTURE (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) DERMATOMYOSITIS (ICD-710.3) ARTHRITIS (ICD-716.90) RHINITIS (ICD-472.0) GENERALIZED PAIN (ICD-780.96)   DERMATOMYOSITIS (ICD-710.3) ARTHRITIS (ICD-716.90) GERD (ICD-530.81) HYPERTENSION (ICD-401.9) HYPERLIPIDEMIA (ICD-272.4) Hx MI 1999, 2000, 2004  Past Surgical History: Last updated: 01/26/2008 knee surgery for meniscal tear lumbar diskectomy  Family History: Last updated: 05/31/2008 Mother: Brain Tumor, Heart disease, stroke, DM, ETOH addiction Biological father: unknown  Half Brother: DM, Obesity  Social History: Last updated: 05/31/2008 Occupation: Disabled, Wheelchair Bound Patient currently smokes.  Alcohol  Use - yes red wine everyday Illicit Drug Use - no Pt lives alone.  No help at home.  Does ADLs himself.  Review of Systems       See HPI  Physical Exam  General:  Well-developed,well-nourished,in no acute distress; alert,appropriate and cooperative throughout examination Nose:  External nasal examination shows no deformity or inflammation. Nasal mucosa are pink and moist without lesions or exudates. Chest Wall:  Stitch removed without difficulty, well healed. Lungs:  Normal respiratory effort, chest expands symmetrically. Lungs are clear to  auscultation, no crackles or wheezes. Heart:  Normal rate and regular rhythm. S1 and S2 normal without  gallop, murmur, click, rub or other extra sounds. Abdomen:  Bowel sounds positive,abdomen soft and non-tender without masses, organomegaly or hernias noted. Neurologic:  No tremor apparent.   Impression & Recommendations:  Problem # 1:  SKIN LESION (ICD-709.9) Assessment Unchanged Normal skin by pathology report.  Still present medial to left nipple.  No changes since I first saw it months ago.    Orders: [facility]- Est  Level 4 (00785)  Problem # 2:  HYPERLIPIDEMIA (ICD-272.4) Assessment: Comment Only mostly hypertriglyceridemia.  Tolerating Niacin , will refill at 1000mg  dose.  Recheck Lipid panel in one to two months.  His updated medication list for this problem includes:    Niaspan  1000 Mg Cr-tabs (Niacin  (antihyperlipidemic)) ..... One tab by mouth qhs  Orders: [facility]- Est  Level 4 (99214)  Problem # 3:  TREMOR, RIGHT HAND (ICD-781.0) Assessment: Improved Resting and intention per pt.  No tremor apparent.  If still c/o tremor at next visit, can increase ropinirole  to 2mg  three times a day as this has helped him in the past.  Orders: [facility]- Est  Level 4 (99214)  Problem # 4:  RHINITIS (ICD-472.0) Assessment: New Will Rx nasonex .  Reassess at next visit. Nasonex  50 Mcg/act Susp (Mometasone  furoate) .... 2 sprays in each nostril two times a day.  Orders: [facility]- Est  Level 4 (99214)  Complete Medication List: 1)  Nexium  40 Mg Cpdr (Esomeprazole  magnesium ) .... Take 1 tablet by mouth once a day 2)  Nitroglycerin  0.4 Mg Subl (Nitroglycerin ) .... As needed 3)  Methotrexate  2.5 Mg Tabs (Methotrexate  sodium) .... 5 tabs by mouth q weekly 4)  Oxycodone -acetaminophen  7.5-325 Mg Tabs (Oxycodone -acetaminophen ) .SABRA.. 1 tablet two times a day 5)  Alprazolam  1 Mg Tbdp (Alprazolam ) .SABRA.. 1 tablet three times a day 6)  Cymbalta  60 Mg Cpep (Duloxetine  hcl) .SABRA.. 1 capsule two times a day 7)  Glucotrol  Xl 5 Mg Xr24h-tab (Glipizide ) .SABRA.. 1 tablet by mouth once daily 8)  Prednisone  5 Mg Tabs  (Prednisone ) .... 2 tablets by mouth once daily 9)  Lisinopril -hydrochlorothiazide  20-25 Mg Tabs (Lisinopril -hydrochlorothiazide ) .... Once daily 10)  Aspir-81 81 Mg Tbec (Aspirin ) .... Once daily 11)  Afrin Nodrip Severe Congest 0.05 % Soln (Oxymetazoline hcl) .... One spray in each nostril for nasal congestion and runny nose. 12)  Niaspan  1000 Mg Cr-tabs (Niacin  (antihyperlipidemic)) .... One tab by mouth qhs 13)  Ropinirole  Hcl 1 Mg Tabs (Ropinirole  hcl) .... One tab by mouth three times a day 14)  Oxycodone  Hcl 5 Mg Tabs (Oxycodone  hcl) .... One tab by mouth q6h as needed for pain 15)  Nasonex  50 Mcg/act Susp (Mometasone  furoate) .... 2 sprays in each nostril two times a day.  Patient Instructions: 1)  Great to see you today, 2)  We removed your stitches today,  refilled your ropinirole , niacin .  Starting Nasonex  for your runny nose. 3)  I want you to come back to see me in 2 months, earlier if you have any problems. 4)  -Dr. ONEIDA. Prescriptions: NASONEX  50 MCG/ACT SUSP (MOMETASONE  FUROATE) 2 sprays in each nostril two times a day.  #1 bottle x 3   Entered and Authorized by:   Debby Petties MD   Signed by:   Debby Petties MD on 08/27/2008   Method used:   Electronically to        [facility]* (retail)       [address]       [place], [place]  [id number]       Ph: [phone]       Fax: [phone]   RxID:   [id number] NIASPAN  1000 MG CR-TABS (NIACIN  (ANTIHYPERLIPIDEMIC)) One tab by mouth qHS  #90 x 6   Entered and Authorized by:   Debby Petties MD   Signed by:   Debby Petties MD on 08/27/2008   Method used:   Electronically to        [facility]* (retail)       [address]       [place], [place]  [id number]       Ph: [phone]       Fax: [phone]   RxID:   [id number] ROPINIROLE  HCL 1 MG TABS (ROPINIROLE  HCL) One tab by mouth three times a day  #180 x 6   Entered and Authorized by:   Debby Petties  MD   Signed by:   Debby Petties MD on 08/27/2008   Method used:   Electronically to        [facility]* (retail)       [address]       [place], [place]  [id number]       Ph: [phone]       Fax: [phone]   RxID:   [id number]    Prevention & Chronic Care Immunizations   Influenza vaccine: Fluvax 3+  (02/19/2008)   Influenza vaccine due: 02/18/2009    Tetanus booster: Not documented   Tetanus booster due: Not Indicated    Pneumococcal vaccine: Not documented  Colorectal Screening   Hemoccult: Not documented   Hemoccult action/deferral: Not indicated  (08/27/2008)    Colonoscopy: Location:  [facility].    (02/29/2008)   Colonoscopy due: 11/2008  Other Screening   PSA: 0.76  (05/31/2008)   PSA due due: 05/31/2009    Smoking status: current  (06/18/2008)   Smoking cessation counseling: yes  (08/27/2008)  Diabetes Mellitus   HgbA1C: 5.7  (08/16/2008)   Hemoglobin A1C due: 08/30/2008    Eye exam: Not documented    Foot exam: Not documented   High risk foot: Not documented   Foot care education: Not documented    Urine microalbumin/creatinine ratio: Not documented   Urine microalbumin/cr due: 05/31/2009  Hypertension   Last Blood Pressure: 108 / 67  (08/27/2008)   Serum creatinine: 0.74  (05/31/2008)   Serum potassium 3.6  (05/31/2008)    Hypertension flowsheet reviewed?: Yes   Progress toward BP goal: At goal  Lipids   Total Cholesterol: 197  (05/31/2008)   LDL: 110  (05/31/2008)   LDL Direct: Not documented   HDL: 39  (05/31/2008)   Triglycerides: 242  (05/31/2008)    SGOT (AST): 29  (05/31/2008)   SGPT (ALT): 25  (05/31/2008)   Alkaline phosphatase: 91  (05/31/2008)   Total bilirubin: 0.7  (05/31/2008)    Lipid flowsheet reviewed?: Yes   Progress toward LDL goal: At goal  Self-Management Support :    Diabetes self-management support: Not documented    Hypertension self-management support: Not  documented    Lipid self-management support: Not documented

## 2010-03-19 NOTE — Progress Notes (Signed)
Summary: Test Res  Phone Note Call from Patient Call back at Home Phone [phone]   Caller: Patient Summary of Call: Pt checking on test results from echo. Initial call taken by: Clydell Hakim,  June 04, 2009 2:48 PM  Follow-up for Phone Call        will forward to MD. Follow-up by: Theresia Lo RN,  June 04, 2009 2:55 PM  Additional Follow-up for Phone Call Additional follow up Details #1::        ECHO was completely normal.  This is GOOD.  Will further discuss with him at his next appt.  (Doesn't need to make a special appt just to discuss this) Additional Follow-up by: Rodney Langton MD,  June 05, 2009 11:14 PM    Additional Follow-up for Phone Call Additional follow up Details #2::    pt notified about results. Follow-up by: Clydell Hakim,  June 09, 2009 8:40 AM

## 2010-03-19 NOTE — Progress Notes (Signed)
Summary: Request to speak with RN  Phone Note Call from Patient Call back at Home Phone [phone]   Reason for Call: Talk to Nurse Summary of Call: pt calling re: his ECHO & blood work his Rheumatologist wants him to have. Pt tried explaining what he needs but not really following. 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 said that rheumatologist requested that we do his bloodwork and that our office would need to call the satilite lab so that bloodwork can be done. mr. maye wants to know if all of this can be done on 03.23.2011.  will forward to pcp.Loralee Pacas CMA  May 02, 2009 5:30 PM

## 2010-03-19 NOTE — Assessment & Plan Note (Signed)
 Summary: weakness,df   Vital Signs:  Patient profile:   60 year old male Temp:     98.1 degrees F Pulse rate:   83 / minute BP sitting:   132 / 93  Vitals Entered By: Harlene Carte CMA (December 03, 2008 1:38 PM) CC: test results Pain Assessment Patient in pain? no        Primary Care Provider:  Debby Petties, MD  CC:  test results.  History of Present Illness: 47M here or fu  Glossitis: had been using nystatin  as Rx'ed symptoms resolved in 2 days.    Dermatomyositis:  Held MTX.    HIV: neg  Nose bleeds:  Has been using nasonex , then rolling up tissue and picking nose.    Habits & Providers  Alcohol -Tobacco-Diet     Tobacco Status: current     Tobacco Counseling: to quit use of tobacco products     Cigarette Packs/Day: 1.0  Allergies: 1)  ! Pcn 2)  ! Codeine 3)  ! Wellbutrin 4)  ! * Zyban 5)  ! Cipro 6)  ! Flagyl 7)  ! * Ppd 8)  ! * Tetanus 9)  ! * Iv Ig 10)  ! * Statins 11)  ! Lexapro (Escitalopram Oxalate) 12)  ! Paxil (Paroxetine Hcl) 13)  ! Prozac 14)  ! Advair Diskus (Fluticasone -Salmeterol) 15)  ! Protopic (Tacrolimus) 16)  ! Temovate E (Clobetasol Prop Emollient Base) 17)  ! 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) FIBROMYALGIA (ICD-729.1) VERTIGO (ICD-780.4) TREMOR, RIGHT HAND (ICD-781.0) SPECIAL SCREENING MALIGNANT NEOPLASM OF PROSTATE (ICD-V76.44) GASTRITIS, HX OF (ICD-V12.79) HEMORRHOIDS, INTERNAL (ICD-455.0) DIVERTICULITIS, HX OF (ICD-V12.79) ANXIETY, CHRONIC (ICD-300.00) DIVERTICULOSIS OF COLON  (ICD-562.10) COLONIC POLYPS, ADENOMATOUS, HX OF (ICD-V12.72) NEPHROLITHIASIS, HX OF (ICD-V13.01) ANEMIA, HX OF (ICD-V12.3) Hx of ESOPHAGEAL STRICTURE (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) DERMATOMYOSITIS (ICD-710.3) ARTHRITIS (ICD-716.90) RHINITIS (ICD-472.0) GENERALIZED PAIN (ICD-780.96)   DERMATOMYOSITIS (ICD-710.3) ARTHRITIS (ICD-716.90) GERD (ICD-530.81) HYPERTENSION (ICD-401.9) HYPERLIPIDEMIA (ICD-272.4) Hx MI 1999, 2000, 2004  Past Surgical History: Last updated: 01/26/2008 knee surgery for meniscal tear lumbar diskectomy  Family History: Last updated: 05/31/2008 Mother: Brain Tumor, Heart disease, stroke, DM, ETOH addiction Biological father: unknown  Half Brother: DM, Obesity  Social History: Last updated: 05/31/2008 Occupation: Disabled, Wheelchair Bound Patient currently smokes.  Alcohol  Use - yes red wine everyday Illicit Drug Use - no Pt lives alone.  No help at home.  Does ADLs himself.  Social History: Packs/Day:  1.0  Review of Systems       See HPI  Physical Exam  General:  Well-developed,well-nourished,in no acute distress; alert,appropriate and cooperative throughout examination Mouth:  Glossitis resolved, tongue appears normal.  Oopharynx normal and non-erythematous. Lungs:  Normal respiratory effort, chest expands symmetrically. Lungs are clear to auscultation, no crackles or wheezes. Heart:  Normal rate and regular rhythm. S1 and S2 normal without gallop, murmur, click, rub or other extra sounds.   Impression & Recommendations:  Problem # 1:  GLOSSITIS (ICD-529.0) Assessment Improved 2/2 fungus, resolved with nystatin .  B12, folate normal. Taking MVI.  Orders: [facility]- Est  Level 4 (00785)  Problem # 2:  DERMATOMYOSITIS (ICD-710.3) Assessment: Unchanged Pt still has not seen rheum, cannot afford copay, asking if I can manage his dermatomyositis and he can stop  seeing Dr. Lenon, I declined.  We held MTX at last visit  2/2 glossitis but now that it is resolved and etiology fungal, may restart MTX.  Orders: [facility]- Est  Level 4 (00785)  Problem # 3:  ACCIDENTAL FALL (ICD-E888.9) Assessment: Unchanged Debilitated.  has outpt rehab appt next friday.  Problem # 4:  RHINITIS (ICD-472.0) Assessment: Unchanged Epistaxis 2/2 picking nose, vicks salve helps, recommended avoiding picking nose, just apply salve with q-tip.  Orders: [facility]- Est  Level 4 (99214)  Complete Medication List: 1)  Nexium  40 Mg Cpdr (Esomeprazole  magnesium ) .... Take 1 tablet by mouth once a day 2)  Nitroglycerin  0.4 Mg Subl (Nitroglycerin ) .... As needed 3)  Methotrexate  2.5 Mg Tabs (Methotrexate  sodium) .... 5 tabs by mouth q weekly 4)  Alprazolam  1 Mg Tbdp (Alprazolam ) .SABRA.. 1 tablet three times a day 5)  Cymbalta  60 Mg Cpep (Duloxetine  hcl) .SABRA.. 1 capsule two times a day 6)  Glucotrol  Xl 5 Mg Xr24h-tab (Glipizide ) .SABRA.. 1 tablet by mouth once daily 7)  Prednisone  5 Mg Tabs (Prednisone ) .... 2 tablets by mouth once daily 8)  Lisinopril -hydrochlorothiazide  20-25 Mg Tabs (Lisinopril -hydrochlorothiazide ) .... Once daily 9)  Aspir-81 81 Mg Tbec (Aspirin ) .... Once daily 10)  Niaspan  1000 Mg Cr-tabs (Niacin  (antihyperlipidemic)) .... One tab by mouth qhs 11)  Ropinirole  Hcl 1 Mg Tabs (Ropinirole  hcl) .... Two tabs by mouth three times a day 12)  Oxycodone  Hcl 5 Mg Tabs (Oxycodone  hcl) .... One tab by mouth q8h as needed for pain 13)  Nasonex  50 Mcg/act Susp (Mometasone  furoate) .... 2 sprays in each nostril two times a day. 14)  Oxybutynin  Chloride 5 Mg Tabs (Oxybutynin  chloride) .... One tab by mouth two times a day 15)  Lidocaine  5 % Oint (Lidocaine ) .... Apply to painful area on nipple and occlude with teraderm/saran wrap x 2 hours then wash off 16)  Nystatin  100000 Unit/ml Susp (Nystatin ) .... 6ml by mouth, swish, keep in mouth as long as possible, then swallow.  do this 4x/day.  continue at least 48h after symptoms resolve. 17)  B  Complex-folic Acid  500-5-200 Mcg-mg-mcg Tabs (Folic acid -vit b6-vit b12) .... Two tabs by mouth daily  Patient Instructions: 1)  HIV negative, 2)  symptoms resolved, fungal infection cleared. 3)  Restart methotrexate , get in to see Dr. Lenon as soon as you can. 4)  Come back to see me at your next appt in November. 5)  -Dr. ONEIDA.

## 2010-03-19 NOTE — Miscellaneous (Signed)
 Summary: Meds Contract  Meds Contract   Imported By: Madelin Daring 06/03/2008 14:24:48  _____________________________________________________________________  External Attachment:    Type:   Image     Comment:   External Document

## 2010-03-19 NOTE — Miscellaneous (Signed)
 Summary: oxycodone  refill  Clinical Lists Changes [name] is in the lobby to pick up a rx for oxycodone  he thinks was left for him.  I did't find any thing in our box.  He said the last visit he had he mentioned it and was told he could pick up on the 28th.  No notation made in emr regarding rx written. Heron Spates  November 14, 2008 8:23 AM  paged md as there is no indication in the chart that md was aware of refill request. spoke with pcp & he asked that pt come back tomorrow for this. pt notified & he is upset. states he has been out since Tuesday. md paged again. pcp asked that another md write same rx for him as pt is due. given to preceptor.Raejean Mau RN  November 14, 2008 8:40 AM  preceptor wrote hard copy script. given to pt.Raejean Mau RN  November 14, 2008 9:04 AM   Noted, refilled 11/14/2008, no further refills until 02/13/09, last refill was to last 2 months, this refill is to last 3 months, slowly weaning patient.  -Dr. Curtis, 11/14/08 9:21am

## 2010-03-19 NOTE — Assessment & Plan Note (Signed)
Summary: BP check/ls  Nurse Visit In for BP check today.  BP left arm 144/90 and right arm 140/92. pulse 76. BP checked manually using large adult cuff. states he is taking all meds as directed.   patient reports he fell last night and thinks he injured his  right hand and back. advised patient that he needs to be checked out by MD and offered work in appointment. however he declines stating his wheelchair is electric and he must get in his house before the rain starts. states he cannot wait today. advised to call back for appointment if he desires.  Theresia Lo RN  March 03, 2009 5:08 PM     states he needs to get refill on his oxycodone before the end of the month. advised will notify MD. Theresia Lo RN  March 03, 2009 5:08 PM   Noted, with several high values we can increase his metoprolol to 25mg  two tabs by mouth two times a day.  He can call when he runs out and I will refill with 50mg  tabs.  Oxycodone refill will have to wait until I am back from [place].  Will fill it first thing when I get back.  Have him come back after 2 weeks of the new dosage of metoprolol for another BP check. Thanks. Rodney Langton MD  March 08, 2009 7:24 PM    Vital Signs:  Patient profile:   60 year old male Temp:     98.2 degrees F  Vitals Entered By: Theresia Lo RN (March 03, 2009 5:07 PM)  Allergies: 1)  ! Pcn 2)  ! Codeine 3)  ! Wellbutrin 4)  ! * Zyban 5)  ! Cipro 6)  ! Flagyl 7)  ! * Ppd 8)  ! * Tetanus 9)  ! * Iv Ig 10)  ! * Statins 11)  ! Lexapro (Escitalopram Oxalate) 12)  ! Paxil (Paroxetine Hcl) 13)  ! Prozac 14)  ! Advair Diskus (Fluticasone-Salmeterol) 15)  ! Protopic (Tacrolimus) 16)  ! Temovate E (Clobetasol Prop Emollient Base) 17)  ! Betamethasone Dipropionate (Betamethasone Dipropionate)  Immunizations Administered:  Influenza Vaccine # 1:    Vaccine Type: Fluvax MCR    Site: left deltoid    Mfr: [employer]    Dose: 0.5 ml    Route: IM  Given by: Theresia Lo RN    Exp. Date: 08/14/2009    Lot #: [id number]    VIS given: 09/24/2008  Flu Vaccine Consent Questions:    Do you have a history of severe allergic reactions to this vaccine? no    Any prior history of allergic reactions to egg and/or gelatin? no    Do you have a sensitivity to the preservative Thimersol? no    Do you have a past history of Guillan-Barre Syndrome? no    Do you currently have an acute febrile illness? no    Have you ever had a severe reaction to latex? no    Vaccine information given and explained to patient? yes  Orders Added: 1)  Influenza Vaccine MCR [00025] 2)  Administration Flu vaccine - MCR [G0008]      Vital Signs:  Patient profile:   60 year old male Temp:     98.2 degrees F  Vitals Entered By: Theresia Lo RN (March 03, 2009 5:07 PM)  spoke with patient and gave message from MD about increasing metroprol 25 mg to two tabs twice daily. he will return for BP check on 03/25/2009. he is ok  with getting Oxycodone when MD returns. states he  won't have the money to get until the Feb 3  anyway. Theresia Lo RN  March 10, 2009 3:49 PM

## 2010-03-19 NOTE — Progress Notes (Signed)
Summary: refill  Phone Note Refill Request Call back at Home Phone [phone] Message from:  Patient  Refills Requested: Medication #1:  OXYCODONE HCL 5 MG TABS One tab by mouth q8h as needed for pain Please call when ready  Initial call taken by: De Nurse,  January 12, 2010 9:11 AM  Follow-up for Phone Call        In envelope at front. Follow-up by: Rodney Langton MD,  January 12, 2010 9:44 AM  Additional Follow-up for Phone Call Additional follow up Details #1::        informed pt of rx being up front Additional Follow-up by: Loralee Pacas CMA,  January 12, 2010 9:55 AM    Prescriptions: OXYCODONE HCL 5 MG TABS (OXYCODONE HCL) One tab by mouth q8h as needed for pain  #180 x 0   Entered and Authorized by:   Rodney Langton MD   Signed by:   Rodney Langton MD on 01/12/2010   Method used:   Print then Give to Patient   RxID:   [id number]

## 2010-03-19 NOTE — Progress Notes (Signed)
 Summary: Rx Prob  Phone Note Call from Patient Call back at Home Phone [phone]   Caller: Patient Summary of Call: pt thinks that his refill for his pain meds is incorrect and he wants to talk to a nurse about this. Initial call taken by: Madelin Daring,  September 09, 2008 4:28 PM  Follow-up for Phone Call        patient states he was given RX for oxycodone  5 mg  and picked it up on June 11,2010. the directions are one tab every 6 hours. he has tried to be conservative with taking the medication . states his pain level on a scale of 1-10 runs 30 . when he took the medication regularly every 6 hours he was able to get the pain level down to a 15. states he was told to return to see MD in September. his concern is that this medication will not last until Sept. he has enough to last until this Sat., July 31, but he would like to get a new Rx to pick up on 07/29 since he will be across the street at an appointment with Dr. Edith. will forward message to MD. Follow-up by: Avelina Sharps RN,  September 09, 2008 4:55 PM  Additional Follow-up for Phone Call Additional follow up Details #1::        He has no refills on the medication.  I gave him enough for 45 days.  He is now 45 days out so I will give him another script.  I am trying to wean him from the medication so instead of taking it every 6 hours, I will have him take it every 8 hours.  This means #180 tabs will last 60 days/2 months, no more medication until 11/11/08.  The script is ready for him at the front.  Please call and let him know this. Additional Follow-up by: Debby Petties MD,  September 10, 2008 9:52 AM    New/Updated Medications: OXYCODONE  HCL 5 MG TABS (OXYCODONE  HCL) One tab by mouth q8h as needed for pain Prescriptions: OXYCODONE  HCL 5 MG TABS (OXYCODONE  HCL) One tab by mouth q8h as needed for pain  #180 x 0   Entered and Authorized by:   Debby Petties MD   Signed by:   Debby Petties MD on 09/10/2008   Method used:    Print then Give to Patient   RxID:   [id number]   Appended Document: Rx Prob pt notified. he will pick it up on Thursday when he is in area for another appt. told him this was every 8 hrs, to last until 9/27

## 2010-03-19 NOTE — Assessment & Plan Note (Signed)
Summary: several issues, falling and sleeping a lot/ls   Vital Signs:  Patient profile:   60 year old male Temp:     98.3 degrees F oral Pulse rate:   97 / minute BP sitting:   132 / 77  (left arm)  Vitals Entered By: Loralee Pacas CMA (April 11, 2009 2:37 PM)  Primary Care Provider:  Rodney Langton, MD   History of Present Illness: [name] with MMP here for fu of concerns about falling and sleeping.  Pt has dermatomyositis, power wheelchair bound.  Endorses falls when he tries to stand up.  Has cane but rarely walks, able to do transfers but not much more.  This has happened in the past and imaging has been negative.  Today he endorses mild pain in hips but no other concerns.     Glossitis: Resolved  Ischemic heart disease: The pt has a hx MI, endorses some swelling in legs, no other CHF symptoms endorsed.  He has never had an ECHO.   No current CP or CP with exertion.  HTN:  Controlled on current regimen.  DM2:  Due for diabetic foot and eye exams.  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  Current Medications (verified): 1)  Nexium 40 Mg Cpdr (Esomeprazole Magnesium) .... Take 1 Tablet By Mouth Once A Day 2)  Nitroglycerin 0.4 Mg Subl (Nitroglycerin) .... As Needed 3)  Methotrexate 2.5 Mg Tabs (Methotrexate Sodium) .... 3 Tabs By Mouth Q Weekly 4)  Alprazolam 1 Mg Tbdp (Alprazolam) .Marland Kitchen.. 1 Tablet Three Times A Day 5)  Cymbalta 60 Mg Cpep (Duloxetine Hcl) .Marland Kitchen.. 1 Capsule Two Times A Day 6)  Glucotrol Xl 5 Mg Xr24h-Tab (Glipizide) .Marland Kitchen.. 1 Tablet By Mouth Once Daily 7)  Prednisone 5 Mg Tabs (Prednisone) .... 2 Tablets By Mouth Once Daily 8)  Lisinopril-Hydrochlorothiazide 20-25 Mg Tabs (Lisinopril-Hydrochlorothiazide) .... Once Daily 9)  Aspir-81 81 Mg Tbec (Aspirin) .... Once Daily 10)  Niaspan 1000 Mg Cr-Tabs (Niacin (Antihyperlipidemic)) .... One Tab By Mouth Qhs 11)  Ropinirole Hcl 2 Mg Tabs (Ropinirole Hcl) .... One Tab By Mouth Tid 12)  Oxycodone Hcl 5 Mg Tabs (Oxycodone  Hcl) .... One Tab By Mouth Q8h As Needed For Pain 13)  Nasonex 50 Mcg/act Susp (Mometasone Furoate) .... 2 Sprays in Each Nostril Two Times A Day. 14)  Oxybutynin Chloride 5 Mg Tabs (Oxybutynin Chloride) .... One Tab By Mouth Two Times A Day 15)  Nystatin 100000 Unit/ml Susp (Nystatin) .... 6ml By Mouth, Swish, Keep in Mouth As Long As Possible, Then Swallow.  Do This 4x/day.  Continue At Least 48h After Symptoms Resolve. 16)  Antivert 25 Mg Tabs (Meclizine Hcl) .... One Tab By Mouth Q8h As Needed For Vertigo. 17)  Metoprolol Tartrate 25 Mg Tabs (Metoprolol Tartrate) .... One Tab By Mouth Bid 18)  Furosemide 20 Mg Tabs (Furosemide) .... One Tab By Mouth No More Than Once Daily For Lower Extremity Swelling.  Allergies (verified): 1)  ! Pcn 2)  ! Codeine 3)  ! Wellbutrin 4)  ! * Zyban 5)  ! Cipro 6)  ! Flagyl 7)  ! * Ppd 8)  ! * Tetanus 9)  ! * Iv Ig 10)  ! * Statins 11)  ! Lexapro (Escitalopram Oxalate) 12)  ! Paxil (Paroxetine Hcl) 13)  ! Prozac 14)  ! Advair Diskus (Fluticasone-Salmeterol) 15)  ! Protopic (Tacrolimus) 16)  ! Temovate E (Clobetasol Prop Emollient Base) 17)  ! Betamethasone Dipropionate (Betamethasone Dipropionate)  Review of Systems  See HPI  Physical Exam  General:  Well-developed,well-nourished,in no acute distress; alert,appropriate and cooperative throughout examination Eyes:  No corneal or conjunctival inflammation noted. EOMI. Perrla. Funduscopic exam in left eye benign, without hemorrhages, exudates or papilledema. Vision grossly normal.  R eye with trauma, difficult to see past anterior chamber. Lungs:  Normal respiratory effort, chest expands symmetrically. Lungs are clear to auscultation, no crackles or wheezes. Heart:  Normal rate and regular rhythm. S1 and S2 normal without gallop, murmur, click, rub or other extra sounds. Abdomen:  Bowel sounds positive,abdomen soft and non-tender without masses, organomegaly or hernias noted. Extremities:  LE  with 2+ pitting edema bilaterally.  Feet don't fit in shoes.  Diabetes Management Exam:    Foot Exam (with socks and/or shoes not present):       Sensory-Monofilament:          Left foot: normal          Right foot: normal   Impression & Recommendations:  Problem # 1:  GLOSSITIS (ICD-529.0) Assessment Improved  Orders: [facility]- Est  Level 4 (69629)  Problem # 2:  ISCHEMIC HEART DISEASE (ICD-414.9) Assessment: Unchanged Cont current meds, checking ECHO to assesss EF/CHF.  Lasix for swelling.  His updated medication list for this problem includes:    Nitroglycerin 0.4 Mg Subl (Nitroglycerin) .Marland Kitchen... As needed    Lisinopril-hydrochlorothiazide 20-25 Mg Tabs (Lisinopril-hydrochlorothiazide) ..... Once daily    Aspir-81 81 Mg Tbec (Aspirin) ..... Once daily    Metoprolol Tartrate 25 Mg Tabs (Metoprolol tartrate) ..... One tab by mouth bid    Furosemide 20 Mg Tabs (Furosemide) ..... One tab by mouth no more than once daily for lower extremity swelling.  Orders: [facility]- Est  Level 4 (99214) 2 D Echo (2 D Echo)  Problem # 3:  HYPERTENSION (ICD-401.9) Assessment: Unchanged Controlled.  His updated medication list for this problem includes:    Lisinopril-hydrochlorothiazide 20-25 Mg Tabs (Lisinopril-hydrochlorothiazide) ..... Once daily    Metoprolol Tartrate 25 Mg Tabs (Metoprolol tartrate) ..... One tab by mouth bid    Furosemide 20 Mg Tabs (Furosemide) ..... One tab by mouth no more than once daily for lower extremity swelling.  Orders: [facility]- Est  Level 4 (52841)  Problem # 4:  DIABETES MELLITUS, TYPE II (ICD-250.00) Assessment: Unchanged Normal fundoscopy, foot exam low risk.  Teaching done.  His updated medication list for this problem includes:    Glucotrol Xl 5 Mg Xr24h-tab (Glipizide) .Marland Kitchen... 1 tablet by mouth once daily    Lisinopril-hydrochlorothiazide 20-25 Mg Tabs (Lisinopril-hydrochlorothiazide) ..... Once daily    Aspir-81 81 Mg Tbec (Aspirin) ..... Once  daily  Orders: [facility]- Est  Level 4 (99214)  Complete Medication List: 1)  Nexium 40 Mg Cpdr (Esomeprazole magnesium) .... Take 1 tablet by mouth once a day 2)  Nitroglycerin 0.4 Mg Subl (Nitroglycerin) .... As needed 3)  Methotrexate 2.5 Mg Tabs (Methotrexate sodium) .... 3 tabs by mouth q weekly 4)  Alprazolam 1 Mg Tbdp (Alprazolam) .Marland Kitchen.. 1 tablet three times a day 5)  Cymbalta 60 Mg Cpep (Duloxetine hcl) .Marland Kitchen.. 1 capsule two times a day 6)  Glucotrol Xl 5 Mg Xr24h-tab (Glipizide) .Marland Kitchen.. 1 tablet by mouth once daily 7)  Prednisone 5 Mg Tabs (Prednisone) .... 2 tablets by mouth once daily 8)  Lisinopril-hydrochlorothiazide 20-25 Mg Tabs (Lisinopril-hydrochlorothiazide) .... Once daily 9)  Aspir-81 81 Mg Tbec (Aspirin) .... Once daily 10)  Niaspan 1000 Mg Cr-tabs (Niacin (antihyperlipidemic)) .... One tab by mouth qhs 11)  Ropinirole Hcl 2 Mg  Tabs (Ropinirole hcl) .... One tab by mouth tid 12)  Oxycodone Hcl 5 Mg Tabs (Oxycodone hcl) .... One tab by mouth q8h as needed for pain 13)  Nasonex 50 Mcg/act Susp (Mometasone furoate) .... 2 sprays in each nostril two times a day. 14)  Oxybutynin Chloride 5 Mg Tabs (Oxybutynin chloride) .... One tab by mouth two times a day 15)  Nystatin 100000 Unit/ml Susp (Nystatin) .... 6ml by mouth, swish, keep in mouth as long as possible, then swallow.  do this 4x/day.  continue at least 48h after symptoms resolve. 16)  Antivert 25 Mg Tabs (Meclizine hcl) .... One tab by mouth q8h as needed for vertigo. 17)  Metoprolol Tartrate 25 Mg Tabs (Metoprolol tartrate) .... One tab by mouth bid 18)  Furosemide 20 Mg Tabs (Furosemide) .... One tab by mouth no more than once daily for lower extremity swelling.  Patient Instructions: 1)  Good to see you Patrick Holt 2)  2D ECHO to assess heart function. 3)  Lasix 20mg  orally no more than once daily for swelling in the legs. 4)  Diabetic eye and foot exams done today. 5)  -Dr. Karie Schwalbe. Prescriptions: FUROSEMIDE 20 MG TABS (FUROSEMIDE)  One tab by mouth no more than once daily for lower extremity swelling.  #30 x 0   Entered and Authorized by:   Rodney Langton MD   Signed by:   Rodney Langton MD on 04/11/2009   Method used:   Electronically to        [facility]* (retail)       [address]       [place], [place]  [address]       Ph: [phone]       Fax: [phone]   RxID:   [id number]   Last Flu Vaccine:  Fluvax MCR (03/03/2009 1:10:10 PM) Flu Vaccine Next Due:  1 yr Diabetic Foot Exam Date:  04/11/2009 Diabetes Foot Check Result:  normal Diabetes Foot Check Next Due:  1 yr Diabetic Eye Exam Date:  04/11/2009 Diabetes Eye Exam Result:  normal Diabetes Eye Exam Due:  1 yr Last HGBA1C:  5.7 (08/16/2008 1:14:22 PM) HGBA1C Next Due:  6 mo Foot Care Education Date:  04/11/2009 Foot Care Education:  completed Foot Care Education Due:  1 yr Self-Mgt EDU Date:  04/11/2009 Self-Mgt EDU:  completed Self-Mgt EDU Next Due:  1 yr     Diabetic Foot Exam Last Podiatry Exam Date: 04/11/2009  Foot Inspection Is there a history of a foot ulcer?              No Is there a foot ulcer now?              No Are the shoes appropriate in style and fit?          Yes Is there swelling or an abnormal foot shape?          No Are the toenails long?                No Are the toenails thick?                No Are the toenails ingrown?              No Is there heavy callous build-up?              No Is there pain in the calf muscle (Intermittent claudication) when walking?    NoIs there a claw toe deformity?  No Is there elevated skin temperature?            No Is there limited ankle dorsiflexion?            No Is there foot or ankle muscle weakness?            No  Diabetic Foot Care Education Patient educated on appropriate care of diabetic feet.  Pulse Check          Right Foot          Left Foot Posterior Tibial:        normal            normal Dorsalis Pedis:        normal             normal  High Risk Feet? No Set Next Diabetic Foot Exam here: 04/11/2010   10-g (5.07) Semmes-Weinstein Monofilament Test           Right Foot          Left Foot Visual Inspection               Test Control      normal         normal Site 1         normal         normal Site 2         normal         normal Site 3         normal         normal Site 4         normal         normal Site 5         normal         normal Site 6         normal         normal Site 7         normal         normal Site 8         normal         normal Site 9         normal         normal Site 10         normal         normal  Impression      normal         normal                Nursing Instructions: Diabetic foot exam today   Appended Document: several issues, falling and sleeping a lot/ls    Clinical Lists Changes  Medications: Changed medication from METOPROLOL TARTRATE 25 MG TABS (METOPROLOL TARTRATE) One tab by mouth BID to METOPROLOL TARTRATE 50 MG TABS (METOPROLOL TARTRATE) One tab by mouth BID - Signed Rx of METOPROLOL TARTRATE 50 MG TABS (METOPROLOL TARTRATE) One tab by mouth BID;  #180 x 6;  Signed;  Entered by: Rodney Langton MD;  Authorized by: Rodney Langton MD;  Method used: Electronically to [facility] [address], [place], [place]  [address], Ph: [phone], Fax: [phone]    Prescriptions: METOPROLOL TARTRATE 50 MG TABS (METOPROLOL TARTRATE) One tab by mouth BID  #180 x 6   Entered and Authorized by:   Rodney Langton MD   Signed by:   Rodney Langton MD on 04/16/2009   Method used:   Electronically  to        [facility]* (retail)       [address]       [place], [place]  [address]       Ph: [phone]       Fax: [phone]   RxID:   [id number]

## 2010-03-19 NOTE — Progress Notes (Signed)
Summary: Rx Req  Phone Note Refill Request Call back at Home Phone [phone] Message from:  Patient  Refills Requested: Medication #1:  OXYCODONE HCL 5 MG TABS One tab by mouth q8h as needed for pain Initial call taken by: Clydell Hakim,  November 13, 2009 8:38 AM  Follow-up for Phone Call        Script waiting at front for pt.  Follow-up by: Rodney Langton MD,  November 13, 2009 8:56 AM    Prescriptions: OXYCODONE HCL 5 MG TABS (OXYCODONE HCL) One tab by mouth q8h as needed for pain  #180 x 0   Entered and Authorized by:   Rodney Langton MD   Signed by:   Rodney Langton MD on 11/13/2009   Method used:   Print then Give to Patient   RxID:   [id number]

## 2010-03-19 NOTE — Progress Notes (Signed)
Summary: refill  Phone Note Refill Request Call back at Home Phone [phone] Message from:  Patient  Refills Requested: Medication #1:  PREDNISONE 5 MG TABS 1 tab by mouth once daily pt needs today  Initial call taken by: De Nurse,  October 21, 2009 11:42 AM  Follow-up for Phone Call        uses [facility]. texted pcp Follow-up by: Golden Circle RN,  October 21, 2009 3:04 PM    Prescriptions: PREDNISONE 5 MG TABS (PREDNISONE) 1 tab by mouth once daily  #30 x 2   Entered and Authorized by:   Rodney Langton MD   Signed by:   Rodney Langton MD on 10/21/2009   Method used:   Electronically to        [facility]* (retail)       [address]       [place], [place]  [address]       Ph: [phone]       Fax: [phone]   RxID:   [id number]

## 2010-03-19 NOTE — Progress Notes (Signed)
Summary: triage  Phone Note Call from Patient Call back at Home Phone [phone]   Caller: Patient Summary of Call: pt is "busted up" - fell out of wheelchair and needs to be seen Initial call taken by: De Nurse,  April 21, 2009 3:16 PM  Follow-up for Phone Call        feel out of w/c twice. friday & saturday. cut ear but "it is already healed up" injured" both armpits down ribcage & hips to knee caps" states he is going to have a "3d echo for my head" soon. went on about his lights. not entirely sure how he fell out of w/c. he thinks something may be broken. has not eaten or drunk or taken any meds. had to crawl to phone to call here. advised ED NOW. he agreed to call 911 & go by ambulance Follow-up by: Golden Circle RN,  April 21, 2009 3:23 PM  Additional Follow-up for Phone Call Additional follow up Details #1::        Noted, to ED.  Thanks [name] Rounds. Additional Follow-up by: Rodney Langton MD,  April 22, 2009 10:14 AM

## 2010-03-19 NOTE — Letter (Signed)
Summary: Mobility Exam  Mobility Exam   Imported By: De Nurse 03/02/2010 14:04:20  _____________________________________________________________________  External Attachment:    Type:   Image     Comment:   External Document

## 2010-03-19 NOTE — Progress Notes (Signed)
Summary: phn msg  Phone Note Call from Patient Call back at Home Phone [phone]   Caller: Patient Summary of Call: Pt wanting to speak with Dr. Benjamin Stain about the fact he would like to get assistance with help cleaning his house.   Initial call taken by: Clydell Hakim,  September 04, 2009 3:46 PM  Follow-up for Phone Call        [facility] will not provide this service, also he has refused SNF.  The yellow pages will have a list of maids he can hire. Follow-up by: Rodney Langton MD,  September 04, 2009 4:57 PM    called and lvm and informed him that he would need to call a maid service.Loralee Pacas CMA  September 05, 2009 11:08 AM

## 2010-03-19 NOTE — Consult Note (Signed)
Summary: [facility]   Imported By: Roderic Ovens 08/12/2009 14:16:18  _____________________________________________________________________  External Attachment:    Type:   Image     Comment:   External Document

## 2010-03-19 NOTE — Assessment & Plan Note (Signed)
 Summary: FU/KH   Vital Signs:  Patient profile:   60 year old male Temp:     98.2 degrees F oral Pulse rate:   88 / minute BP sitting:   127 / 78  (left arm)  Vitals Entered By: Arland Morel (October 29, 2008 2:03 PM) CC: f/u-  pt fell this weekend. c/o hip pain Is Patient Diabetic? Yes  Pain Assessment Patient in pain? yes     Location: hip Intensity: >10 Type: tingling   Primary Care Provider:  Debby Petties, MD  CC:  f/u-  pt fell this weekend. c/o hip pain.  History of Present Illness: 11M with MMP here to discuss fall this wknd with hip pain and ST.  Fall:  Accidentally tripped, usually wheelchair bound but able to walk short distances, fell onto right hip, with immediate pain.  Was able to ambulate after but with persistent pain.  No LOC, no CP, no SOB, no HA.    ST:  Present 4 days now, feels that throat is raw.  Occasional cough, no fevers/chills/V/N/D/C.  no swollen glands.  No sick contacts, no hx GC/Chlam.  Pt had multiple other complaints as he usually does but agrees to discuss them at a later visit.  Allergies: 1)  ! Pcn 2)  ! Codeine 3)  ! Wellbutrin 4)  ! * Zyban 5)  ! Cipro 6)  ! Flagyl 7)  ! * Ppd 8)  ! * Tetanus 9)  ! * Iv Ig 10)  ! * Statins 11)  ! Lexapro (Escitalopram Oxalate) 12)  ! Paxil (Paroxetine Hcl) 13)  ! Prozac 14)  ! Advair Diskus (Fluticasone -Salmeterol) 15)  ! Protopic (Tacrolimus) 16)  ! Temovate E (Clobetasol Prop Emollient Base) 17)  ! 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) FIBROMYALGIA  (ICD-729.1) VERTIGO (ICD-780.4) TREMOR, RIGHT HAND (ICD-781.0) SPECIAL SCREENING MALIGNANT NEOPLASM OF PROSTATE (ICD-V76.44) GASTRITIS, HX OF (ICD-V12.79) HEMORRHOIDS, INTERNAL (ICD-455.0) DIVERTICULITIS, HX OF (ICD-V12.79) ANXIETY, CHRONIC (ICD-300.00) DIVERTICULOSIS OF COLON (ICD-562.10) COLONIC POLYPS, ADENOMATOUS, HX OF (ICD-V12.72) NEPHROLITHIASIS, HX OF (ICD-V13.01) ANEMIA, HX OF (ICD-V12.3) Hx of ESOPHAGEAL STRICTURE (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) DERMATOMYOSITIS (ICD-710.3) ARTHRITIS (ICD-716.90) RHINITIS (ICD-472.0) GENERALIZED PAIN (ICD-780.96)   DERMATOMYOSITIS (ICD-710.3) ARTHRITIS (ICD-716.90) GERD (ICD-530.81) HYPERTENSION (ICD-401.9) HYPERLIPIDEMIA (ICD-272.4) Hx MI 1999, 2000, 2004  Past Surgical History: Last updated: 01/26/2008 knee surgery for meniscal tear lumbar diskectomy  Family History: Last updated: 05/31/2008 Mother: Brain Tumor, Heart disease, stroke, DM, ETOH addiction Biological father: unknown  Half Brother: DM, Obesity  Social History: Last updated: 05/31/2008 Occupation: Disabled, Wheelchair Bound Patient currently smokes.  Alcohol  Use - yes red wine everyday Illicit Drug Use - no Pt lives alone.  No help at home.  Does ADLs himself.  Review of Systems       See HPI  Physical Exam  General:  Well-developed,well-nourished,in no acute distress; alert,appropriate and cooperative throughout examination Head:  Normocephalic and atraumatic without obvious abnormalities.  Reddish rash over face. Eyes:  No corneal or conjunctival inflammation noted. EOMI. Perrla. Ears:  External ear exam shows no significant lesions or deformities.  Nose:  External nasal examination shows no deformity or inflammation. Mouth:  Oral mucosa and oropharynx without lesions or exudates, somewhat erythematous, no tonsillitis.  Teeth in good repair. Neck:  No deformities, masses, or tenderness noted. Chest Wall:  No deformities, masses,  tenderness or gynecomastia noted. Nipple lesion healed and not TTP. Lungs:  Normal respiratory effort, chest expands symmetrically. Lungs are clear to auscultation, no crackles or wheezes. Heart:  Normal rate and regular rhythm. S1 and S2 normal without gallop, murmur, click, rub or other extra sounds. Msk:  TTP post right iliac crest/SI joints.  I was able to IR and ER right hip without a problem.  No overt swelling or bruising.   Impression & Recommendations:  Problem # 1:  SORE THROAT (ICD-462) Assessment New Rapid strep neg.  Likely Viral URI.  Tylenol  OTC and hydration.  Orders: Rapid Strep-[facility] (12569) [facility]- Est  Level 4 (00785)  Problem # 2:  ACCIDENTAL FALL (ICD-E888.9) Assessment: New With persistent hip pain, ill check 3-view XR of right hip.  Orders: Diagnostic X-Ray/Fluoroscopy (Diagnostic X-Ray/Flu) [facility]- Est  Level 4 (00785)  Problem # 3:  SKIN LESION (ICD-709.9) Assessment: Improved Still present but not causing any pain.  Orders: [facility]- Est  Level 4 (99214)  Problem # 4:  TREMOR, RIGHT HAND (ICD-781.0) Assessment: Improved Present on intention but not bothering patient.  Orders: [facility]- Est  Level 4 (99214)  Complete Medication List: 1)  Nexium  40 Mg Cpdr (Esomeprazole  magnesium ) .... Take 1 tablet by mouth once a day 2)  Nitroglycerin  0.4 Mg Subl (Nitroglycerin ) .... As needed 3)  Methotrexate  2.5 Mg Tabs (Methotrexate  sodium) .... 5 tabs by mouth q weekly 4)  Alprazolam  1 Mg Tbdp (Alprazolam ) .SABRA.. 1 tablet three times a day 5)  Cymbalta  60 Mg Cpep (Duloxetine  hcl) .SABRA.. 1 capsule two times a day 6)  Glucotrol  Xl 5 Mg Xr24h-tab (Glipizide ) .SABRA.. 1 tablet by mouth once daily 7)  Prednisone  5 Mg Tabs (Prednisone ) .... 2 tablets by mouth once daily 8)  Lisinopril -hydrochlorothiazide  20-25 Mg Tabs (Lisinopril -hydrochlorothiazide ) .... Once daily 9)  Aspir-81 81 Mg Tbec (Aspirin ) .... Once daily 10)  Niaspan  1000 Mg Cr-tabs (Niacin  (antihyperlipidemic)) .... One tab  by mouth qhs 11)  Ropinirole  Hcl 1 Mg Tabs (Ropinirole  hcl) .... Two tabs by mouth three times a day 12)  Oxycodone  Hcl 5 Mg Tabs (Oxycodone  hcl) .... One tab by mouth q8h as needed for pain 13)  Nasonex  50 Mcg/act Susp (Mometasone  furoate) .... 2 sprays in each nostril two times a day. 14)  Oxybutynin  Chloride 5 Mg Tabs (Oxybutynin  chloride) .... One tab by mouth two times a day 15)  Lidocaine  5 % Oint (Lidocaine ) .... Apply to painful area on nipple and occlude with teraderm/saran wrap x 2 hours then wash off  Patient Instructions: 1)  Good to see you today, 2)  I think the most pressing issues are your hip pain after your fall and your sore throat. 3)  I am sending you for Xrays of your hip. 4)  I will also have my nurses swab you or strep throat. 5)  Come back to see me to discuss your other concerns. 6)  -Dr. ONEIDA.  Laboratory Results  Date/Time Received: October 29, 2008 2:43 PM  Date/Time Reported: October 29, 2008 2:57 PM   Other Tests  Rapid Strep: negative Comments: ...............test performed by......SABRABonnie A. Jordan, [place] (ASCP)

## 2010-03-19 NOTE — Progress Notes (Signed)
Summary: Orders  Phone Note Call from Patient Call back at Home Phone [phone]   Reason for Call: Talk to Nurse Summary of Call: pt was able to get a walker with a seat, cant get shower chair bc its not covered, pt asking for home care to help with light cleaning etc. MD has to put in an order.  Initial call taken by: Knox Royalty,  August 29, 2009 1:49 PM  Follow-up for Phone Call        [place]. Follow-up by: Rodney Langton MD,  August 29, 2009 6:05 PM  New Problems: MOTOR PROBLEMS WITH LIMBS (ICD-V49.2)   New Problems: MOTOR PROBLEMS WITH LIMBS (ICD-V49.2)   forwarded to pcp.Loralee Pacas CMA  September 01, 2009 4:59 PM  Appended Document: Orders called and pt is to call back with information for the chair  Appended Document: Orders left vm for Bascom Levels ([phone]) to discuss with her Mr. Kleist orders.

## 2010-03-20 ENCOUNTER — Telehealth: Payer: Self-pay | Admitting: Sports Medicine

## 2010-03-25 NOTE — Progress Notes (Signed)
Summary: Pharmacy question  Phone Note From Pharmacy Call back at [phone]   Caller: [facility] Call For: Dr. Karie Schwalbe.  Summary of Call: [place] went to pharmacy to pick up oxycodone however [facility] denied this stating a new regulation as of Jan 1st 2012 that residents cannot prescribe Schedule 2 substances.  I have checked with attendings and pharmacy staff regarding this and they have not heard of this regulation.  Will look into this.  I authorized an override so that Jonovan could pick up his prescription today at [facility]. Initial call taken by: Rodney Langton MD,  March 20, 2010 11:24 AM

## 2010-03-31 ENCOUNTER — Ambulatory Visit: Payer: Medicare HMO | Attending: Sports Medicine | Admitting: Physical Therapy

## 2010-03-31 DIAGNOSIS — R262 Difficulty in walking, not elsewhere classified: Secondary | ICD-10-CM | POA: Insufficient documentation

## 2010-03-31 DIAGNOSIS — IMO0001 Reserved for inherently not codable concepts without codable children: Secondary | ICD-10-CM | POA: Insufficient documentation

## 2010-04-06 ENCOUNTER — Telehealth: Payer: Self-pay | Admitting: Sports Medicine

## 2010-04-06 NOTE — Telephone Encounter (Signed)
Spoke with pt and he states that the person from the scooter store said that Dr. Benjamin Stain will need to sign off on it and fax it back. Pt states that his wheelchair is causing him to be in danger. Will forward to PCP

## 2010-04-06 NOTE — Telephone Encounter (Signed)
I have already filled this out and placed it in the to-fax box last week. -T

## 2010-04-13 NOTE — Telephone Encounter (Signed)
Forms were faxed, refaxed last week.

## 2010-05-04 LAB — BASIC METABOLIC PANEL
BUN: 4 mg/dL — ABNORMAL LOW (ref 6–23)
BUN: 6 mg/dL (ref 6–23)
CO2: 24 mEq/L (ref 19–32)
CO2: 26 mEq/L (ref 19–32)
CO2: 28 mEq/L (ref 19–32)
CO2: 29 mEq/L (ref 19–32)
CO2: 30 mEq/L (ref 19–32)
Calcium: 8.1 mg/dL — ABNORMAL LOW (ref 8.4–10.5)
Calcium: 8.8 mg/dL (ref 8.4–10.5)
Calcium: 7.6 mg/dL — ABNORMAL LOW (ref 8.4–10.5)
Chloride: 102 mEq/L (ref 96–112)
Chloride: 104 mEq/L (ref 96–112)
Chloride: 105 mEq/L (ref 96–112)
Chloride: 105 mEq/L (ref 96–112)
Chloride: 107 mEq/L (ref 96–112)
Chloride: 110 mEq/L (ref 96–112)
Creatinine, Ser: 0.56 mg/dL (ref 0.4–1.5)
Creatinine, Ser: 0.58 mg/dL (ref 0.4–1.5)
Creatinine, Ser: 0.58 mg/dL (ref 0.4–1.5)
GFR calc Af Amer: >60 mL/min (ref >60)
GFR calc Af Amer: >60 mL/min (ref >60)
GFR calc Af Amer: >60 mL/min (ref >60)
GFR calc Af Amer: >60 mL/min (ref >60)
GFR calc Af Amer: >60 mL/min (ref >60)
GFR calc non Af Amer: >60 mL/min (ref >60)
GFR calc non Af Amer: >60 mL/min (ref >60)
GFR calc non Af Amer: >60 mL/min (ref >60)
Glucose, Bld: 81 mg/dL (ref 70–99)
Potassium: 3.7 mEq/L (ref 3.5–5.1)
Potassium: 3.7 mEq/L (ref 3.5–5.1)
Potassium: 3.7 mEq/L (ref 3.5–5.1)
Potassium: 3.8 mEq/L (ref 3.5–5.1)
Potassium: 3.3 mEq/L — ABNORMAL LOW (ref 3.5–5.1)
Sodium: 135 mEq/L (ref 135–145)
Sodium: 140 mEq/L (ref 135–145)
Sodium: 134 mEq/L — ABNORMAL LOW (ref 135–145)
Sodium: 137 mEq/L (ref 135–145)

## 2010-05-04 LAB — POCT CARDIAC MARKERS
CKMB, poc: 16.8 ng/mL (ref 1.0–8.0)
Troponin i, poc: 0.09 ng/mL (ref 0.00–0.09)

## 2010-05-04 LAB — URINALYSIS, ROUTINE W REFLEX MICROSCOPIC
Ketones, ur: 40 mg/dL — AB
Nitrite: NEGATIVE
Specific Gravity, Urine: 1.024 (ref 1.005–1.030)
Urobilinogen, UA: 2 mg/dL — ABNORMAL HIGH (ref 0.0–1.0)
pH: 6 (ref 5.0–8.0)

## 2010-05-04 LAB — GLUCOSE, CAPILLARY
Glucose-Capillary: 100 mg/dL — ABNORMAL HIGH (ref 70–99)
Glucose-Capillary: 101 mg/dL — ABNORMAL HIGH (ref 70–99)
Glucose-Capillary: 104 mg/dL — ABNORMAL HIGH (ref 70–99)
Glucose-Capillary: 104 mg/dL — ABNORMAL HIGH (ref 70–99)
Glucose-Capillary: 106 mg/dL — ABNORMAL HIGH (ref 70–99)
Glucose-Capillary: 108 mg/dL — ABNORMAL HIGH (ref 70–99)
Glucose-Capillary: 121 mg/dL — ABNORMAL HIGH (ref 70–99)
Glucose-Capillary: 122 mg/dL — ABNORMAL HIGH (ref 70–99)
Glucose-Capillary: 124 mg/dL — ABNORMAL HIGH (ref 70–99)
Glucose-Capillary: 133 mg/dL — ABNORMAL HIGH (ref 70–99)
Glucose-Capillary: 72 mg/dL (ref 70–99)
Glucose-Capillary: 81 mg/dL (ref 70–99)
Glucose-Capillary: 82 mg/dL (ref 70–99)
Glucose-Capillary: 83 mg/dL (ref 70–99)
Glucose-Capillary: 87 mg/dL (ref 70–99)
Glucose-Capillary: 87 mg/dL (ref 70–99)
Glucose-Capillary: 89 mg/dL (ref 70–99)
Glucose-Capillary: 94 mg/dL (ref 70–99)
Glucose-Capillary: 94 mg/dL (ref 70–99)
Glucose-Capillary: 96 mg/dL (ref 70–99)
Glucose-Capillary: 97 mg/dL (ref 70–99)
Glucose-Capillary: 106 mg/dL — ABNORMAL HIGH (ref 70–99)
Glucose-Capillary: 56 mg/dL — ABNORMAL LOW (ref 70–99)
Glucose-Capillary: 77 mg/dL (ref 70–99)
Glucose-Capillary: 79 mg/dL (ref 70–99)
Glucose-Capillary: 94 mg/dL (ref 70–99)

## 2010-05-04 LAB — CBC
HCT: 33 % — ABNORMAL LOW (ref 39.0–52.0)
HCT: 36.5 % — ABNORMAL LOW (ref 39.0–52.0)
HCT: 38.5 % — ABNORMAL LOW (ref 39.0–52.0)
HCT: 39.7 % (ref 39.0–52.0)
HCT: 42.5 % (ref 39.0–52.0)
Hemoglobin: 11.5 g/dL — ABNORMAL LOW (ref 13.0–17.0)
Hemoglobin: 11.8 g/dL — ABNORMAL LOW (ref 13.0–17.0)
Hemoglobin: 12.5 g/dL — ABNORMAL LOW (ref 13.0–17.0)
Hemoglobin: 12.7 g/dL — ABNORMAL LOW (ref 13.0–17.0)
Hemoglobin: 12.8 g/dL — ABNORMAL LOW (ref 13.0–17.0)
MCHC: 33 g/dL (ref 30.0–36.0)
MCHC: 33.2 g/dL (ref 30.0–36.0)
MCHC: 33.5 g/dL (ref 30.0–36.0)
MCHC: 33.9 g/dL (ref 30.0–36.0)
MCHC: 34.6 g/dL (ref 30.0–36.0)
MCHC: 34.9 g/dL (ref 30.0–36.0)
MCHC: 33.8 g/dL (ref 30.0–36.0)
MCV: 109.2 fL — ABNORMAL HIGH (ref 78.0–100.0)
MCV: 109.6 fL — ABNORMAL HIGH (ref 78.0–100.0)
MCV: 110.2 fL — ABNORMAL HIGH (ref 78.0–100.0)
MCV: 111 fL — ABNORMAL HIGH (ref 78.0–100.0)
MCV: 111 fL — ABNORMAL HIGH (ref 78.0–100.0)
MCV: 110.5 fL — ABNORMAL HIGH (ref 78.0–100.0)
Platelets: 192 10*3/uL (ref 150–400)
Platelets: 195 10*3/uL (ref 150–400)
Platelets: 461 10*3/uL — ABNORMAL HIGH (ref 150–400)
Platelets: 256 10*3/uL (ref 150–400)
RBC: 3.01 MIL/uL — ABNORMAL LOW (ref 4.22–5.81)
RBC: 3.09 MIL/uL — ABNORMAL LOW (ref 4.22–5.81)
RBC: 3.18 MIL/uL — ABNORMAL LOW (ref 4.22–5.81)
RBC: 3.42 MIL/uL — ABNORMAL LOW (ref 4.22–5.81)
RBC: 3.45 MIL/uL — ABNORMAL LOW (ref 4.22–5.81)
RBC: 3.47 MIL/uL — ABNORMAL LOW (ref 4.22–5.81)
RDW: 21.9 % — ABNORMAL HIGH (ref 11.5–15.5)
RDW: 20.7 % — ABNORMAL HIGH (ref 11.5–15.5)
WBC: 6.1 10*3/uL (ref 4.0–10.5)
WBC: 6.2 10*3/uL (ref 4.0–10.5)
WBC: 7.3 10*3/uL (ref 4.0–10.5)
WBC: 7.4 10*3/uL (ref 4.0–10.5)
WBC: 7.5 10*3/uL (ref 4.0–10.5)
WBC: 9 10*3/uL (ref 4.0–10.5)
WBC: 14.9 10*3/uL — ABNORMAL HIGH (ref 4.0–10.5)

## 2010-05-04 LAB — HEPARIN LEVEL (UNFRACTIONATED)
Heparin Unfractionated: 0.15 IU/mL — ABNORMAL LOW (ref 0.30–0.70)
Heparin Unfractionated: 0.19 IU/mL — ABNORMAL LOW (ref 0.30–0.70)
Heparin Unfractionated: 0.31 IU/mL (ref 0.30–0.70)

## 2010-05-04 LAB — POCT I-STAT 3, ART BLOOD GAS (G3+)
Acid-Base Excess: 3 mmol/L — ABNORMAL HIGH (ref 0.0–2.0)
Acid-base deficit: 2 mmol/L (ref 0.0–2.0)
Acid-base deficit: 2 mmol/L (ref 0.0–2.0)
Bicarbonate: 16.8 mEq/L — ABNORMAL LOW (ref 20.0–24.0)
Bicarbonate: 21.9 mEq/L (ref 20.0–24.0)
O2 Saturation: 99 %
O2 Saturation: 91 %
O2 Saturation: 93 %
Patient temperature: 102.1
Patient temperature: 98.5
Patient temperature: 98.5
Patient temperature: 98.8
TCO2: 18 mmol/L (ref 0–100)
TCO2: 26 mmol/L (ref 0–100)
TCO2: 26 mmol/L (ref 0–100)
pCO2 arterial: 31.9 mmHg — ABNORMAL LOW (ref 35.0–45.0)
pCO2 arterial: 37.6 mmHg (ref 35.0–45.0)
pH, Arterial: 7.208 — ABNORMAL LOW (ref 7.350–7.450)
pH, Arterial: 7.319 — ABNORMAL LOW (ref 7.350–7.450)
pH, Arterial: 7.4 (ref 7.350–7.450)
pO2, Arterial: 231 mmHg — ABNORMAL HIGH (ref 80.0–100.0)
pO2, Arterial: 81 mmHg (ref 80.0–100.0)

## 2010-05-04 LAB — COMPREHENSIVE METABOLIC PANEL
ALT: 53 U/L (ref 0–53)
AST: 152 U/L — ABNORMAL HIGH (ref 0–37)
AST: 94 U/L — ABNORMAL HIGH (ref 0–37)
Albumin: 1.7 g/dL — ABNORMAL LOW (ref 3.5–5.2)
Albumin: 1.8 g/dL — ABNORMAL LOW (ref 3.5–5.2)
Albumin: 2.5 g/dL — ABNORMAL LOW (ref 3.5–5.2)
Alkaline Phosphatase: 57 U/L (ref 39–117)
BUN: 6 mg/dL (ref 6–23)
BUN: 8 mg/dL (ref 6–23)
BUN: 10 mg/dL (ref 6–23)
CO2: 28 mEq/L (ref 19–32)
Calcium: 7.3 mg/dL — ABNORMAL LOW (ref 8.4–10.5)
Chloride: 110 mEq/L (ref 96–112)
Chloride: 95 mEq/L — ABNORMAL LOW (ref 96–112)
Creatinine, Ser: 0.72 mg/dL (ref 0.4–1.5)
Creatinine, Ser: 1.09 mg/dL (ref 0.4–1.5)
GFR calc Af Amer: >60 mL/min (ref >60)
GFR calc non Af Amer: >60 mL/min (ref >60)
Potassium: 3.3 mEq/L — ABNORMAL LOW (ref 3.5–5.1)
Sodium: 137 mEq/L (ref 135–145)
Total Bilirubin: 1.4 mg/dL — ABNORMAL HIGH (ref 0.3–1.2)
Total Bilirubin: 1.9 mg/dL — ABNORMAL HIGH (ref 0.3–1.2)
Total Protein: 4.8 g/dL — ABNORMAL LOW (ref 6.0–8.3)
Total Protein: 4.9 g/dL — ABNORMAL LOW (ref 6.0–8.3)

## 2010-05-04 LAB — CULTURE, BAL-QUANTITATIVE W GRAM STAIN
Colony Count: NO GROWTH
Culture: NO GROWTH

## 2010-05-04 LAB — PROTIME-INR
INR: 1.21 (ref 0.00–1.49)
INR: 1.22 (ref 0.00–1.49)
INR: 1.26 (ref 0.00–1.49)
INR: 1.5 — ABNORMAL HIGH (ref 0.00–1.49)
INR: 1.34 (ref 0.00–1.49)
Prothrombin Time: 15.2 seconds (ref 11.6–15.2)
Prothrombin Time: 15.7 seconds — ABNORMAL HIGH (ref 11.6–15.2)

## 2010-05-04 LAB — POCT I-STAT, CHEM 8
BUN: 8 mg/dL (ref 6–23)
Chloride: 106 mEq/L (ref 96–112)
Glucose, Bld: 118 mg/dL — ABNORMAL HIGH (ref 70–99)
HCT: 48 % (ref 39.0–52.0)
Potassium: 4.1 mEq/L (ref 3.5–5.1)

## 2010-05-04 LAB — URINE CULTURE

## 2010-05-04 LAB — CK TOTAL AND CKMB (NOT AT ARMC)
CK, MB: 2.4 ng/mL (ref 0.3–4.0)
Relative Index: 0.1 (ref 0.0–2.5)
Relative Index: 0.1 (ref 0.0–2.5)
Relative Index: 0.1 (ref 0.0–2.5)
Total CK: 3040 U/L — ABNORMAL HIGH (ref 7–232)
Total CK: 5691 U/L — ABNORMAL HIGH (ref 7–232)

## 2010-05-04 LAB — DIFFERENTIAL
Basophils Absolute: 0 10*3/uL (ref 0.0–0.1)
Lymphocytes Relative: 4 % — ABNORMAL LOW (ref 12–46)
Monocytes Relative: 5 % (ref 3–12)
Neutrophils Relative %: 91 % — ABNORMAL HIGH (ref 43–77)

## 2010-05-04 LAB — APTT: aPTT: 37 seconds (ref 24–37)

## 2010-05-04 LAB — MAGNESIUM
Magnesium: 2.2 mg/dL (ref 1.5–2.5)
Magnesium: 2 mg/dL (ref 1.5–2.5)

## 2010-05-04 LAB — CARDIAC PANEL(CRET KIN+CKTOT+MB+TROPI)
CK, MB: 6.7 ng/mL (ref 0.3–4.0)
CK, MB: 13.8 ng/mL (ref 0.3–4.0)
Relative Index: 2.6 — ABNORMAL HIGH (ref 0.0–2.5)
Total CK: 7356 U/L — ABNORMAL HIGH (ref 7–232)
Troponin I: 0.03 ng/mL (ref 0.00–0.06)
Troponin I: 0.16 ng/mL — ABNORMAL HIGH (ref 0.00–0.06)

## 2010-05-04 LAB — TROPONIN I: Troponin I: 0.24 ng/mL — ABNORMAL HIGH (ref 0.00–0.06)

## 2010-05-04 LAB — AMYLASE: Amylase: 25 U/L (ref 0–105)

## 2010-05-04 LAB — CARBOXYHEMOGLOBIN
Methemoglobin: 1.1 % (ref 0.0–1.5)
Total hemoglobin: 13 g/dL — ABNORMAL LOW (ref 13.5–18.0)

## 2010-05-04 LAB — HEPATIC FUNCTION PANEL
Albumin: 2.2 g/dL — ABNORMAL LOW (ref 3.5–5.2)
Total Bilirubin: 1.3 mg/dL — ABNORMAL HIGH (ref 0.3–1.2)
Total Protein: 5.2 g/dL — ABNORMAL LOW (ref 6.0–8.3)

## 2010-05-04 LAB — CK: Total CK: 1130 U/L — ABNORMAL HIGH (ref 7–232)

## 2010-05-04 LAB — LEGIONELLA ANTIGEN, URINE

## 2010-05-04 LAB — URINE MICROSCOPIC-ADD ON

## 2010-05-04 LAB — CULTURE, BLOOD (ROUTINE X 2): Culture: NO GROWTH

## 2010-05-04 LAB — STREP PNEUMONIAE URINARY ANTIGEN: Strep Pneumo Urinary Antigen: NEGATIVE

## 2010-05-04 LAB — AMMONIA: Ammonia: 48 umol/L — ABNORMAL HIGH (ref 11–35)

## 2010-05-04 LAB — PHOSPHORUS: Phosphorus: 3.6 mg/dL (ref 2.3–4.6)

## 2010-05-04 LAB — LACTIC ACID, PLASMA: Lactic Acid, Venous: 0.9 mmol/L (ref 0.5–2.2)

## 2010-05-04 LAB — PROCALCITONIN: Procalcitonin: 3.32 ng/mL

## 2010-05-04 LAB — LIPASE, BLOOD: Lipase: 15 U/L (ref 11–59)

## 2010-05-15 ENCOUNTER — Ambulatory Visit (INDEPENDENT_AMBULATORY_CARE_PROVIDER_SITE_OTHER): Payer: Medicare HMO | Admitting: Sports Medicine

## 2010-05-15 ENCOUNTER — Encounter: Payer: Self-pay | Admitting: Sports Medicine

## 2010-05-15 DIAGNOSIS — M3313 Other dermatomyositis without myopathy: Secondary | ICD-10-CM

## 2010-05-15 DIAGNOSIS — E119 Type 2 diabetes mellitus without complications: Secondary | ICD-10-CM

## 2010-05-15 DIAGNOSIS — I10 Essential (primary) hypertension: Secondary | ICD-10-CM

## 2010-05-15 DIAGNOSIS — E785 Hyperlipidemia, unspecified: Secondary | ICD-10-CM

## 2010-05-15 DIAGNOSIS — M3390 Dermatopolymyositis, unspecified, organ involvement unspecified: Secondary | ICD-10-CM

## 2010-05-15 DIAGNOSIS — R52 Pain, unspecified: Secondary | ICD-10-CM

## 2010-05-15 LAB — COMPREHENSIVE METABOLIC PANEL
ALT: 16 U/L (ref 0–53)
Albumin: 4.2 g/dL (ref 3.5–5.2)
Alkaline Phosphatase: 104 U/L (ref 39–117)
CO2: 26 mEq/L (ref 19–32)
Glucose, Bld: 69 mg/dL — ABNORMAL LOW (ref 70–99)
Potassium: 3.6 mEq/L (ref 3.5–5.3)
Sodium: 141 mEq/L (ref 135–145)
Total Protein: 7.3 g/dL (ref 6.0–8.3)

## 2010-05-15 LAB — POCT GLYCOSYLATED HEMOGLOBIN (HGB A1C): Hemoglobin A1C: 5.4

## 2010-05-15 LAB — CBC
HCT: 48.8 % (ref 39.0–52.0)
Hemoglobin: 16.3 g/dL (ref 13.0–17.0)
MCH: 33.3 pg (ref 26.0–34.0)
MCHC: 33.4 g/dL (ref 30.0–36.0)
MCV: 99.6 fL (ref 78.0–100.0)
Platelets: 274 10*3/uL (ref 150–400)
RBC: 4.9 MIL/uL (ref 4.22–5.81)
RDW: 14.3 % (ref 11.5–15.5)
WBC: 8.2 10*3/uL (ref 4.0–10.5)

## 2010-05-15 LAB — LIPID PANEL
Cholesterol: 179 mg/dL (ref 0–200)
HDL: 44 mg/dL (ref >39)
LDL Cholesterol: 101 mg/dL — ABNORMAL HIGH (ref 0–99)
Total CHOL/HDL Ratio: 4.1 ratio
Triglycerides: 172 mg/dL — ABNORMAL HIGH (ref <150)
VLDL: 34 mg/dL (ref 0–40)

## 2010-05-15 LAB — COMPREHENSIVE METABOLIC PANEL WITH GFR
AST: 18 U/L (ref 0–37)
BUN: 18 mg/dL (ref 6–23)
Calcium: 9.4 mg/dL (ref 8.4–10.5)
Chloride: 104 meq/L (ref 96–112)
Creat: 0.82 mg/dL (ref 0.40–1.50)
Total Bilirubin: 0.6 mg/dL (ref 0.3–1.2)

## 2010-05-15 LAB — CK: Total CK: 126 U/L (ref 7–232)

## 2010-05-15 MED ORDER — OXYCODONE HCL 5 MG PO TABS: 5.0000 mg | ORAL_TABLET | Freq: Three times a day (TID) | ORAL | Status: DC | PRN

## 2010-05-15 MED ORDER — LISINOPRIL-HYDROCHLOROTHIAZIDE 10-12.5 MG PO TABS: 1.0000 | ORAL_TABLET | Freq: Every day | ORAL | Status: DC

## 2010-05-15 MED ORDER — METHOTREXATE 2.5 MG PO TABS: 7.5000 mg | ORAL_TABLET | ORAL | Status: DC

## 2010-05-15 NOTE — Assessment & Plan Note (Signed)
Rechecking lipid panel 

## 2010-05-15 NOTE — Patient Instructions (Signed)
Good to see you, Checking bloodwork. Refilled methotrexate and oxycodone. Changed your BP medicine to the 10/12.5mg  form so you don't have to break it in half. Be sure to have Rosalita Chessman fax me the order for your headrest. Come back to see me in 3 months.  Patrick Holt. Benjamin Stain, M.D.

## 2010-05-15 NOTE — Assessment & Plan Note (Addendum)
Refilling methotrexate. Checking CBC, CMET, CPK. Pt is still in the process of finding another rheumatologist.

## 2010-05-15 NOTE — Assessment & Plan Note (Signed)
Refilled oxycodone. Rxing this q 2 months. He does NOT request early refills or exhibit seeking behaviors.

## 2010-05-15 NOTE — Assessment & Plan Note (Signed)
A1c's have been very well controlled in the past. Most likely in diabetic range 2/2 chronic pred. Rechecking.

## 2010-05-15 NOTE — Assessment & Plan Note (Signed)
Well controlled. Having trouble breaking lisinopril/hctz in half 2/2 dermatomyositis. Will change to 10/12.5 pills so he doesn't have to break in half. Checking lytes, creat.

## 2010-05-15 NOTE — Progress Notes (Signed)
  Subjective:    Patient ID: Patrick Holt, male    DOB: May 04, 1950, 60 y.o.   MRN: [id number]  HPI HTN:  Controlled but cannot break pills in half 2/2 dermatomyositis.   Wheelchair:  Needs headrest.  Will have order sent to me.  Dermatomyositis:  Needs refill on methotrexate, still hasn't found rheumatologist, fired by his previous one.  Symptoms overall controlled.  Chronic pain:  Needs refill on oxycodone.  No HA, N/V/D/C/SOB/CP/fevers/chills.  Review of Systems See HPI    Objective:   Physical Exam  Constitutional: He appears well-developed and well-nourished. No distress.  Cardiovascular: Normal rate, regular rhythm and normal heart sounds.  Exam reveals no gallop and no friction rub.   No murmur heard. Pulmonary/Chest: Effort normal. No respiratory distress. He has no wheezes. He has no rales. He exhibits no tenderness.  Skin: Skin is warm and dry.          Assessment & Plan:

## 2010-06-01 LAB — LACTATE DEHYDROGENASE, ISOENZYMES
LDH 2: 34 % (ref 29–42)
LDH 3: 22 % (ref 18–30)
LDH 4: 7 % — ABNORMAL LOW (ref 8–15)
LDH 5: 8 % (ref 6–23)

## 2010-06-01 LAB — COMPREHENSIVE METABOLIC PANEL
ALT: 21 U/L (ref 0–53)
AST: 24 U/L (ref 0–37)
Albumin: 3.4 g/dL — ABNORMAL LOW (ref 3.5–5.2)
Alkaline Phosphatase: 65 U/L (ref 39–117)
Chloride: 102 mEq/L (ref 96–112)
GFR calc Af Amer: >60 mL/min (ref >60)
Potassium: 2.8 mEq/L — ABNORMAL LOW (ref 3.5–5.1)
Sodium: 142 mEq/L (ref 135–145)
Total Bilirubin: 1 mg/dL (ref 0.3–1.2)
Total Protein: 6 g/dL (ref 6.0–8.3)

## 2010-06-01 LAB — BASIC METABOLIC PANEL
Calcium: 9 mg/dL (ref 8.4–10.5)
GFR calc non Af Amer: >60 mL/min (ref >60)
Glucose, Bld: 84 mg/dL (ref 70–99)
Potassium: 2.9 mEq/L — ABNORMAL LOW (ref 3.5–5.1)
Sodium: 143 mEq/L (ref 135–145)

## 2010-06-01 LAB — GLUCOSE, CAPILLARY
Glucose-Capillary: 116 mg/dL — ABNORMAL HIGH (ref 70–99)
Glucose-Capillary: 89 mg/dL (ref 70–99)

## 2010-06-01 LAB — CBC
HCT: 45.3 % (ref 39.0–52.0)
Hemoglobin: 14.8 g/dL (ref 13.0–17.0)
MCV: 97.8 fL (ref 78.0–100.0)
Platelets: 296 10*3/uL (ref 150–400)
WBC: 8.8 10*3/uL (ref 4.0–10.5)

## 2010-06-01 LAB — CK ISOENZYMES
CK-MB: 0 % (ref <5)
Creatine Kinase-Total: 60 U/L (ref 44–196)

## 2010-06-16 DIAGNOSIS — F449 Dissociative and conversion disorder, unspecified: Secondary | ICD-10-CM

## 2010-06-16 HISTORY — DX: Dissociative and conversion disorder, unspecified: F44.9

## 2010-06-19 ENCOUNTER — Ambulatory Visit (INDEPENDENT_AMBULATORY_CARE_PROVIDER_SITE_OTHER): Payer: Medicare HMO | Admitting: Sports Medicine

## 2010-06-19 ENCOUNTER — Encounter: Payer: Self-pay | Admitting: Sports Medicine

## 2010-06-19 DIAGNOSIS — E785 Hyperlipidemia, unspecified: Secondary | ICD-10-CM

## 2010-06-19 DIAGNOSIS — N3941 Urge incontinence: Secondary | ICD-10-CM

## 2010-06-19 DIAGNOSIS — E119 Type 2 diabetes mellitus without complications: Secondary | ICD-10-CM

## 2010-06-19 DIAGNOSIS — I10 Essential (primary) hypertension: Secondary | ICD-10-CM

## 2010-06-19 MED ORDER — OXYBUTYNIN CHLORIDE ER 10 MG PO TB24: 10.0000 mg | ORAL_TABLET | Freq: Every day | ORAL | Status: DC

## 2010-06-19 MED ORDER — HYOSCYAMINE SULFATE 0.125 MG PO TABS: 0.1250 mg | ORAL_TABLET | ORAL | Status: DC | PRN

## 2010-06-19 NOTE — Patient Instructions (Addendum)
Great to see  You, oxybutinin 10mg  daily. Hyoscyamine every 4h as needed for colon spasm. STOP glipizide. Come back to see me in June.  Patrick Holt. Benjamin Stain, M.D.

## 2010-06-19 NOTE — Assessment & Plan Note (Signed)
Last lipid panel controlled. No changes needed.

## 2010-06-19 NOTE — Assessment & Plan Note (Signed)
Lst A1c 5.4%. May DC glipizide for now. Recheck A1c in 2 months. Glucose intolerance is likely iatrogenic from prednisone.

## 2010-06-19 NOTE — Assessment & Plan Note (Signed)
Cont oxybutinin at 10mg  dose. Also having some colon spasms, will add hyoscyamine.

## 2010-06-19 NOTE — Assessment & Plan Note (Signed)
Well-controlled. No changes needed. 

## 2010-06-19 NOTE — Progress Notes (Signed)
  Subjective:    Patient ID: Patrick Holt, male    DOB: 08-11-1950, 60 y.o.   MRN: [id number]  HPI Pt here to discuss:  Bladder spasm:  Dx urge incontinence, takes oxybutinin.  10mg , not helping currently, still gets urge to void and can't make it to toilet.  Has been on IR but not XR tablets.  Stools:  Also occasionally gets a sudden urge to stool, then is unable to make it to toilet, and messes self.  This has happened a  Few times.  No LE weakness/numbness/paresthesias/saddle anesthesia.  DM2:  Last A1c was 5.4%.  Review of Systems    See HPI Objective:   Physical Exam  Constitutional: He appears well-developed and well-nourished. No distress.  Cardiovascular: Normal rate, regular rhythm and normal heart sounds.   Pulmonary/Chest: Effort normal and breath sounds normal. No respiratory distress. He has no wheezes. He has no rales. He exhibits no tenderness.  Abdominal: Soft. There is no tenderness.  Skin: Skin is warm and dry.          Assessment & Plan:

## 2010-06-24 ENCOUNTER — Other Ambulatory Visit: Payer: Self-pay | Admitting: Sports Medicine

## 2010-06-24 ENCOUNTER — Telehealth: Payer: Self-pay | Admitting: Sports Medicine

## 2010-06-24 DIAGNOSIS — N3941 Urge incontinence: Secondary | ICD-10-CM

## 2010-06-24 DIAGNOSIS — K589 Irritable bowel syndrome without diarrhea: Secondary | ICD-10-CM | POA: Insufficient documentation

## 2010-06-24 MED ORDER — DIPHENOXYLATE-ATROPINE 2.5-0.025 MG PO TABS: 1.0000 | ORAL_TABLET | Freq: Four times a day (QID) | ORAL | Status: AC | PRN

## 2010-06-24 MED ORDER — PSYLLIUM 100 % PO PACK: 1.0000 | PACK | Freq: Three times a day (TID) | ORAL | Status: DC

## 2010-06-24 NOTE — Telephone Encounter (Signed)
Need referral to [facility] hospital for CT scan

## 2010-06-24 NOTE — Telephone Encounter (Signed)
Sure, CT of what, and what is the indication, with or without iv contrast, with or without oral contrast?

## 2010-06-25 NOTE — Telephone Encounter (Signed)
Would need to see him in an office visit dedicated to "back pain."  He has had other complaints at every other office visit and we can only do one complaint per visit.

## 2010-06-25 NOTE — Telephone Encounter (Signed)
Patrick Holt, I spoke with Patrick Holt and asked what he wanted done. He stated that he has a "ruptured disk" and that he knows what it feels like and he has had this before and he is absolutely sure this is what is wrong with his back. He would like CT scans done on his back. I asked if he wanted with or without contrast and he said that he doesn't care which just as long as he can get this done. He also stated that the reason he wanted the CT's done is because "the x-rays did not show anything and he knows his disk is ruptured".  -----Huntley Dec

## 2010-06-25 NOTE — Telephone Encounter (Signed)
Patient calling back, routing to RN to get details for CT scan.

## 2010-06-29 NOTE — Telephone Encounter (Signed)
Patient called back, advised of MDs message, pt not happy b/c he says hes tried discussing back pain with MD, told pt when he scheduled his visit for back pain we will only discuss that one acute issue. Pt will call back when its not suppose to rain since he has a power wheel chair.

## 2010-06-30 NOTE — H&P (Signed)
NAMEELEAZAR, KIMMEY NO.:  [id number]   MEDICAL RECORD NO.:  [id number]          PATIENT TYPE:  INP   LOCATION:  1516                         FACILITY:  [facility]   PHYSICIAN:  Hedwig Morton. Juanda Chance, MD     DATE OF BIRTH:  1951-01-25   DATE OF ADMISSION:  02/28/2008  DATE OF DISCHARGE:                              HISTORY & PHYSICAL   HISTORY OF PRESENT ILLNESS:  Mr. Jerger is a 60 year old male, known  to Dr. 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  The  patient is mostly confined to a wheelchair and is therefore being  admitted for a bowel prep for colonoscopy in the morning.  Because of  recurrent dysphagia, the patient will also undergo an EGD.  Mr. Delauder  saw Dr. Lina Sar on January 29, 2008, and at that time was  complaining of abdominal pain which she describes to me as being across  the midabdomen and unrelated to meals or defecation.  Of note, the  patient has a history of diverticulosis as well as diverticulitis.  He  has been having problems with nausea recently.  No acid reflux,  excessive belching, or bloating.  No hematochezia.  No unusual weight  loss.  No constipation or diarrhea.   PAST MEDICAL HISTORY:  1. Diverticulosis.  2. Diverticulitis.  3. Anxiety.  4. Adenomatous colon polyps.  5. Diabetes.  6. COPD.  7. History of nephrolithiasis.  8. History of esophageal motility disorder.  9. Dermatomyositis.  10.Arthritis.  11.Hypertension.  12.Dyslipidemia.   FAMILY MEDICAL HISTORY:  Diabetes, coronary artery disease.  No family  history of colorectal cancer.   SOCIAL HISTORY:  The patient is disabled.  Positive for tobacco use.  Drinks red wine on a daily basis.   MEDICATIONS:  Home medications include:  1. NitroQuick 0.4 mg sublingual as needed.  2. Methotrexate every Wednesday.  3. Nexium 40 mg daily.  4. Oxycodone every 6 hours as needed.  5.  Xanax 1 mg 3 times day as needed.  6. Glipizide 5 mg daily.  7. Cymbalta 60 mg b.i.d.  8. Prednisone 5 mg b.i.d.  9. Lisinopril/hydrochlorothiazide 20/25 mg daily.  10.Aspirin 81 mg daily.  11.Calcium 5000 mg daily.  12.Potassium 20 mEq daily.   ALLERGIES:  Multiple allergies including include PENICILLIN, CIPRO,  FLAGYL, TETANUS, PPD, WELLBUTRIN, CODEINE, ZYBAN, and IVIG.   REVIEW OF SYSTEMS:  Positive for mid abdominal pain.  Positive for  nausea.  Positive for recurrent dysphasia.  All other review of systems  negative other than were noted in the HPI.   PHYSICAL ASSESSMENT:  Temperature 97.6, pulse 77, respirations 20, blood  pressure 118/73.  Mr. Kustra is a 60 year old male lying in bed in no acute distress.  HEENT:  No scleral icterus.  Conjunctivae pink.  NECK:  Supple.  No palpable lymphadenopathy.  CARDIAC:  Regular rate and rhythm.   LUNGS:  Clear to auscultation bilaterally.  GASTROINTESTINAL:  Hypoactive bowel sounds.  The abdomen is soft, mildly  distended, nontender.  No obvious palpable masses.  No obvious  hepatomegaly.  EXTREMITIES:  No lower extremity edema.  No skin rashes or lesions noted  to the lower extremities.  SKIN:  Intact without significant lesions.  PSYCHOLOGICAL:  Alert and cooperative.   LABORATORY STUDIES:  Pending.   DIAGNOSTIC STUDIES:  Pending.   IMPRESSION:  1. 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 Dermatomyositis since 2001.  3. History of diverticulitis 2005 and 2009.  4. History of adenomatous colon polyps 2001.  No polyps in 2005.  5. Inability to prep for colonoscopy at home.  6. Diabetes.  7. Anxiety.  8. Chronic obstructive pulmonary disease.  9. Chronic steroid use.   PLAN:  1. Will admit for bowel prep.  Procedure will be done under propofol      as the patient is on chronic narcotics at      home and will likely have a high tolerance for sedation.   2. EGD with probable esophageal dilation.  3. Colonoscopy.      Willette Cluster, NP      Hedwig Morton. Juanda Chance, MD  Electronically Signed    PG/MEDQ  D:  02/28/2008  T:  02/28/2008  Job:  [id number]

## 2010-07-03 NOTE — Op Note (Signed)
[name], [name]             ACCOUNT NO.:  [id number]   MEDICAL RECORD NO.:  [id number]          PATIENT TYPE:  AMB   LOCATION:  DSC                          FACILITY:  [facility]   PHYSICIAN:  Jefry H. 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   POSTOPERATIVE DIAGNOSIS:  Otosclerosis.   PROCEDURE:  Right stapedectomy.   SURGEON:  Jefry H. Pollyann Kennedy, MD   ANESTHESIA:  General endotracheal anesthesia was used.   COMPLICATIONS:  No complications.   BLOOD LOSS:  Minimal.   FINDINGS:  Posteriorly displaced oval window niche, chorda tympani nerve  anteriorly located, preserved, thickening of the entire footplate with a  stapes fixation.  No complications. A Lippe modified Robinson bucket-  handle prosthesis 0.4 mm diameter shaft and 4 mm length was used.   HISTORY:  60 year old with a history of hearing loss on the right side  consistent with otosclerosis.  Risks, benefits, alternatives,  complications of procedure explained to the patient, who seemed to  understand and agreed to surgery.   PROCEDURE:  The patient was taken to the operating room, placed the  operating table in supine position.  Following induction of general  endotracheal anesthesia, the right ear was prepped and draped in  standard fashion.  Xylocaine with epinephrine was infiltrated into four  quadrants of the external auditory canal and the tragus.  A posteriorly  based tympanomeatal flap was developed and brought forward exposing the  middle ear.  The chorda tympani nerve was identified and preserved.  A  stapes curette was used to remove bone from the posterosuperior annulus  to facilitate exposure of the oval window niche.  Extensive bone removal  was necessary because of the positioning of the oval window niche and  because of the positioning of  the chorda tympani nerve but the nerve  itself was preserved.  The ossicular chain was palpated.  There was good  mobility of the malleus and incus but minimal to no mobility of the  stapes.  A tragal perichondrial graft was harvested and cut to size and  shape.  The defect was reapproximated with running 5-0 plain gut.  The  incudostapedial joint was divided.  The stapedial tendon was cut.  Control hole was created in the footplate and the stapes superstructure  was down fractured off of the footplate and removed.  A Rosen pick and a  right angle pick were used to remove two large fragments of the  thickened footplate.  The graft was put into position.  The prosthesis  was sized up at 4.0 mm and was placed without difficulty with bucket-  handle lying over the lenticular process.  The anterior tympanic cavity  was packed with saline-soaked  Gelfoam.  The flap was brought back to its native position and secured  in place with Ciprodex soaked Gelfoam.  Cotton ball with bacitracin was  placed in the meatus.  The patient was awakened, extubated, transferred  to recovery in stable condition.      Jefry H. Pollyann Kennedy, MD  Electronically Signed     JHR/MEDQ  D:  03/16/2006  T:  03/16/2006  Job:  [id number]   cc:   Olene Craven, M.D.

## 2010-07-03 NOTE — Discharge Summary (Signed)
Patrick Holt, Patrick Holt NO.:  [id number]   MEDICAL RECORD NO.:  [id number]          PATIENT TYPE:  INP   LOCATION:  0483                         FACILITY:  [facility]   PHYSICIAN:  Lina Sar, M.D. [facility]  DATE OF BIRTH:  1950-08-02   DATE OF ADMISSION:  11/25/2003  DATE OF DISCHARGE:  11/27/2003                                 DISCHARGE SUMMARY   ADMITTING DIAGNOSES:  1.  Recurrent diverticulitis.  2.  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  3.  Chronic obstructive pulmonary disease.  4.  Hypertension.  5.  History of cardiomyopathy.  6.  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  7.  Adult-onset diabetes mellitus.   DISCHARGE DIAGNOSES:  1.  Stable post colonoscopy with finding of moderately severe      diverticulosis.  No active diverticulitis and no recurrent colon polyps.  2.  Small hiatal hernia with early stricture, stable status post dilation.  3.  Hypokalemia, resolved.  4.  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  5.  Chronic obstructive pulmonary disease.  6.  Hypertension.  7.  History of cardiomyopathy.  8.  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  9.  Adult-onset diabetes mellitus.   CONSULTS:  None.   PROCEDURES:  Colonoscopy and upper endoscopy per Dr. Juanda Chance on November 26, 2003.   BRIEF HISTORY:  Jashan is a 60 year old white male primary patient of Dr.  Everardo All and known to Dr. Lina Sar who does have a history of adenomatous  colon polyps and diverticulitis.  He has a history of dermatomyositis which  was diagnosed in 2001 and does carry an increased incidence of colon cancer,  also with history of fibromyalgia and esophageal dysmotility secondary to  the dermatomyositis.  He is disabled because of this, lives alone, and is  unable to prep for colonoscopy without assistance.   The  patient had a recent episode of diverticulitis which was treated by Dr.  Everardo All and confirmed on CT scan showing diverticulosis of the descending  colon and sigmoid colon and no significant diverticulitis.  There was some  subtle stranding, however, around the descending colon.  He was then  referred to Dr. Juanda Chance on October 4 and seen in the office and it was felt  that he was due for follow-up colonoscopy and the patient himself, as well,  is very concerned about his colon cancer risk.  He is admitted to the  hospital at this time to undergo colon bowel prep with assistance and then  colonoscopy as well as upper endoscopy.   LABORATORIES:  On October 10 WBC of 10.3, hemoglobin 14.2, hematocrit 42.5,  MCV 94.2, platelets 334.  Potassium on admission 2.9.  Follow-up on October  11 was 2.9 and follow-up on October 12 was 3.6.  Glucose 121, BUN 13,  creatinine 0.9.   HOSPITAL COURSE:  The patient was admitted to the service of Dr. Lina Sar.  He was kept on a clear liquid diet  and underwent bowel prep with  MiraLax, Gatorade, and Dulcolax on the afternoon of admission.  He then  underwent colonoscopy and upper endoscopy the following day with findings as  outlined above.  He did have an early esophageal stricture and did have some  chronic complaints of dysphagia and the stricture was dilated to 48 mm with  Maloney.  Fortunately, he did not have any recurrent colon polyps and  recommendation was for follow-up again in five years or sooner p.r.n.  He  tolerated the procedures without difficulty.  His diet was advanced.  However, patient remained in the hospital until the morning of October 12  because of sedation and therefore inability to operate a motor vehicle.  He  has no local family, etc., therefore, we watched him overnight and he was  discharged the following morning on October 12 in a stable condition.   MEDICATIONS:  1.  Same as on admission with aspirin 81 mg daily.  2.   Prozac 40 daily.  3.  Methotrexate 15 weekly.  4.  Alprazolam 1 mg b.i.d.  5.  Glipizide as previous.  6.  Oxycodone 5/325 b.i.d.  7.  HCTZ 25 daily.  8.  Reserpine 0.1 daily.  9.  Folic acid supplement daily.  10. Advair b.i.d.  He is uncertain of the dosage.  11. Nitroglycerin on a p.r.n. basis.  12. We also placed him on Protonix 40 mg daily.   Patient admitted during his hospitalization that he is supposed to be on a  potassium supplement which he had not mentioned and said that he had run out  of it recently and was unable to afford a refill at the time and that likely  explains his hypokalemia on admission.     Amy   AE/MEDQ  D:  12/10/2003  T:  12/10/2003  Job:  [id number]

## 2010-07-03 NOTE — H&P (Signed)
NAMERAJAT, STAVER NO.:  [id number]   MEDICAL RECORD NO.:  [id number]          PATIENT TYPE:  OBV   LOCATION:  0483                         [facility]ACILITY:  [facility]   PHYSICIAN:  Lina Sar, M.D. [facility]  DATE OF BIRTH:  [birth year]   DATE OF ADMISSION:  11/25/2003  DATE OF DISCHARGE:                                HISTORY & PHYSICAL   CHIEF COMPLAINT:  For colonoscopy.   REASON:  Diverticulitis.   HISTORY:  Patrick Holt is a 60 year old white male known to Dr. Lina Sar, a  primary patient of Dr. Everardo All, who has a history of diverticulitis and  colon polyps.  He also has a history of dermatomyositis, which was diagnosed  in 2001, fibromyalgia, and esophageal dysmotility secondary to the  dermatomyositis.  Also with a history of anemia, nephrolithiasis, COPD,  cataracts, coronary artery disease with cardiomyopathy, and adult-onset  diabetes mellitus.   Patient had recently been treated for an episode of diverticulitis, per Dr.  Everardo All, and this was confirmed on recent CT scan showing diverticulosis of  the descending colon and sigmoid colon but no significant diverticulitis.  There was some subtle stranding of the pericolonic fat in the lower  descending colon.  He was referred to Dr. Juanda Chance on November 19, 2003 for  followup, and the patient is due at this time for a follow-up colonoscopy.  He is very concerned about colon cancer because of the increased incidence  with the dermatomyositis.  He is disabled because of the dermatomyositis and  fibromyalgia, lives alone, and due to his disabilities, it is felt that he  would be unable to prep at home; therefore was admitted for bowel prep and  colonoscopy today.   MEDICATIONS:  1.  Aspirin 81 mg q.d.  2.  Prozac 40 q.d.  3.  Methotrexate 15 mg weekly.  4.  Alprazolam 1 mg b.i.d.  5.  Glipizide daily.  He is uncertain of the dose.  6.  Oxycodone 5/325 mg b.i.d.  7.  HCTZ 25 q.d.  8.  Reserpine 0.1 mg q.d.  9.  Folic  acid supplement daily.  10. He is also on Advair b.i.d.  11. Nitroglycerin p.r.n.   ALLERGIES:  1.  WELLBUTRIN and ZYBAN intolerance.  2.  CIPRO AND FLAGYL has reaction of swelling.  3.  PENICILLIN.  4.  PPD.  5.  TETANUS.  6.  IV IG.  7.  He also listed codeine but takes oxycodone without difficulty.   PAST HISTORY:  1.  Lumbar diskectomy in 2000.  2.  Knee repair in 2000.  3.  Dermatomyositis diagnosed in 2001.  4.  Cardiomyopathy with ejection fraction of 40% by previous records.  5.  COPD.  6.  Adult-onset diabetes mellitus.  7.  GERD.  8.  Hypertension.  9.  Anxiety.   FAMILY HISTORY:  Mother deceased at 65 with a brain tumor.  Father with a  history of hypertension and diabetes.   SOCIAL HISTORY:  Patient is disabled.  Divorced.  Lives alone.  His wife and  daughter apparently left him last year.  He is a smoker, one-half  pack per  day.  No ETOH.   REVIEW OF SYSTEMS:  Pertinent for rather diffuse body discomfort, frequent  nausea.  He says he has not been sleeping well since he had a decrease in  the dose of his alprazolam from b.i.d. to q.d.  He ambulates with a cane.  He has a scooter as well and says that he is able to drive during the  daylight hours.   PHYSICAL EXAMINATION:  VITAL SIGNS:  Temp 98.7, pulse 71, sats 94% on room  air.  Blood pressure 138/93.  Weight is 240.  GENERAL:  A well-developed white male, talkative, in no acute distress.  HEENT:  Normocephalic and atraumatic.  EOMI.  PERRLA.  Sclerae are  anicteric.  PULMONARY:  Clear to A&P.  CARDIOVASCULAR:  S1 and S2.  No murmur, rub or gallop.  ABDOMEN:  Obese, soft.  Bowel sounds are active.  There is no focal  abdominal tenderness.  No mass or hepatosplenomegaly.  RECTAL:  Not done at this time.  EXTREMITIES:  Trace edema bilaterally.   LABORATORY STUDIES:  Pending on admission.   IMPRESSION:  39.  A 60 year old white male with a history of colon polyps, recent      diverticulitis, now  admitted for colon prep and colonoscopy.  He is      disabled secondary to dermatomyositis and unable to prep at home.  2.  Chronic obstructive pulmonary disease.  3.  Hypertension.  4.  Cardiomyopathy.  5.  Fibromyalgia.  6.  Dermatomyositis.  7.  Esophageal dysmotility secondary to the dermatomyositis.  8.  Adult-onset diabetes mellitus.   PLAN:  Patient is admitted to the service of Dr. Lina Sar for bowel prep.  A colonoscopy will be scheduled on Tuesday, October 11.  Will also proceed  with the EGD simultaneously.     Amy   AE/MEDQ  D:  11/25/2003  T:  11/25/2003  Job:  [id number]

## 2010-07-03 NOTE — Op Note (Signed)
NAMENEWMAN, WAREN             ACCOUNT NO.:  [id number]   MEDICAL RECORD NO.:  [id number]          PATIENT TYPE:  AMB   LOCATION:  SDS                          FACILITY:  [facility]   PHYSICIAN:  Jefry H. Pollyann Kennedy, MD     DATE OF BIRTH:  03-May-1950   DATE OF PROCEDURE:  12/29/2005  DATE OF DISCHARGE:                               OPERATIVE REPORT   PREOPERATIVE DIAGNOSIS:  Left base of tongue mass.   POSTOPERATIVE DIAGNOSIS:  Left base of tongue mass.   PROCEDURE:  Direct laryngoscopy with excisional biopsy of left base of  tongue mass.   SURGEON:  Pollyann Kennedy.   General endotracheal anesthesia was used.   No complications.   Blood loss about 50 mL.   FINDINGS:  A large pedunculated papillary type lesion arising from the  left base of tongue, conglomerate size approximately 4 cm in largest  dimension.  The stalk that attached to the base of tongue was very small  and approximately 0.05 cm in diameter.  No other findings.  No  complications.   HISTORY:  This is a 61 year old with a long and complicated medical  history, but for the past 6-12 months, he has had soreness in his throat  on the left side and the sensation of a lump in that area.  Office exam  revealed a pedunculated mass.  Risks, benefits, alternatives, and  complications of the procedure were explained to the patient who seemed  to understand and agreed to surgery.   PROCEDURE:  The patient was taken to the operating room and placed on  the operating table in the supine.  Following induction of general  endotracheal anesthesia, the table was turned, and the patient was  draped in a standard fashion.  A sliding Jackson laryngoscope was  entered into the oral cavity and used to view the larynx and  hypopharynx.  The above-mentioned findings were noted.  The sliding  device and the laryngoscope were removed to facilitate instrumentation  through the scope.  A spinal needle with syringe of Xylocaine with  epinephrine was  infiltrated into the base of the lesion.  A total of  approximately 1-2 mL was injected.  The lesion was then removed  using a biopsy forceps, removing it in a piecemeal fashion down to the  base.  The base was then cauterized with a suction cautery unit.  The  pharynx was irrigated with saline and suctioned.  The patient was then  awakened and extubated and transferred to recovery in stable condition.      Jefry H. Pollyann Kennedy, MD  Electronically Signed     JHR/MEDQ  D:  12/29/2005  T:  12/30/2005  Job:  [id number]   cc:   Olene Craven, M.D.

## 2010-07-03 NOTE — Consult Note (Signed)
[place]  Patient:    Patrick Holt, Patrick Holt                     MRN: [id number] Proc. Date: 03/20/00 Adm. Date:  [id number] Attending:  Devoria Albe CC:         Barbette Hair. Vaughan Basta., M.D.  Francisca December, M.D.   Consultation Report  FINDINGS: 1. Syncope, probably vasovagal.  Has history of similar episodes in the past    though not quite as severe. 2. Dermatomyositis. 3. Esophageal strictures by history. 4. Obesity. 5. Recent negative work-up for myocardial ischemia with negative adenosine    cardiolite, myocardial perfusion scanning, last week per Dr. Corliss Marcus.  RECOMMENDATIONS: 1. Schedule Holter monitor this week through [facility] to rule out    arrhythmia which I think would still likely be rare. 2. Increased hydration, generally. 3. Contact MD for any symptoms consistent with rapid heart rate such as    palpitations, shortness of breath, lightheadedness, or dizziness or change    in vision.  DISCUSSION:  [name] is a very pleasant unfortunate 60 year old male who has contracted dermatomyositis and now is on prednisone and methotrexate.  He is followed up at [facility].  He reports that his dermatomyositis started after being injected intramuscularly with a PPD skin test a couple of years ago, and has required high doses of steroids to maintain him in remission.  He has been treated with intravenous immunoglobulin earlier in the month of January, 2002, and is now on methotrexate and prednisone and had his prednisone tapered from 40 to 35 mg approximately January 2002.  Since that time he has had the onset of increasing arthralgias, myalgias, and more skin erythema.  He feels as if he is going to need to go back up on his steroids.  He is to see his rheumatologist in 2 days at [facility].  Earlier today he had some mild nausea to Phenergan and also he took darvocet for arthralgias and myalgias.  A  couple of hours later he went to church and was being greeted by a lot of people all at once.  He recalls someone reaching down and gently squeezing his trapezius muscle on the right and then his next recollection was waking up on the floor and feeling very diaphoretic.  He noticed no palpitations or chest pains.  No obvious shortness of breath.  He did have intense diaphoresis.  The patient states that he has had several episodes of lightheadedness followed by dizziness over the past couple of years and sometimes it would happen when he was driving and were quite worrisome.  He does state that he has actually passed out in the very distant past in various situations.  These were never exertionally related and it sounded as though they may have been around the times where he was having pain or standing for prolonged periods.  He had eaten and taken fluids lightly during the day previous to the syncopal episode this evening.  He has been monitored now for a total of 5 hours both in the emergency medical services vehicle and in the [facility] emergency room with no known bradycardia, tachycardia or arrhythmia.  He feels back to baseline currently.  MEDICATIONS: 1. Hydrochlorothiazide. 2. Methotrexate. 3. Prednisone 35 mg a day. 4. Os-Cal 1500 mg a day. 5. Flexeril. 6. Darvocet. 7. Phenergan. 8. Nexium 40 mg a day.  PAST MEDICAL HISTORY: 1. Hypertension. 2. Obesity.  3. Borderline elevations in blood sugar with prednisone therapy and esophageal    strictures. 4. He has no seizure history.  PHYSICAL EXAMINATION:  GENERAL:  He is an obese polite male who is an excellent historian.  VITAL SIGNS:  Blood pressure is 134/90, pulse is 78 and regular.  Respiratory rate is 16 and easy.  He is afebrile.  CHEST:  Clear to auscultation.  CORONARY:  Regular rate and rhythm without murmur or gallop.  NECK:  Carotids are without bruits.  No JVD.  ABDOMEN:  Obese abdomen but  soft.  EXTREMITIES:  Without edema.  There is 4/5 strength throughout.  It is hard for him to flex his thighs and raise his arms up.  SKIN:  With diffuse erythema which is chronic.  EKG:  Reveals normal sinus rhythm at 76 beats per minute.  It is normal a EKG. Normal voltages and axis.  CPK is 64.  LABORATORY DATA: Sodium 141, potassium 3.4, chloride 102, bicarbonate 30, BUN 13, creatinine 0.9.  Blood sugar 138.  ASSESSMENT:  A 60 year old male with symptoms which are very characteristic of vasovagal or neurogenic syncope.  He had no chest pain, ectopy or shortness of breath but did have intense diaphoresis just right as he was waking up from being unconscious for probably 15 to 20 seconds.  His EKG shows no evidence of ischemia or ectopy.  He has had no tachycardia or bradycardia.  PLAN:  As above and the patient is to be discharged home with his brother.  He is to not drive a car.  We should plan to check a Holter monitor this week through [facility].  We will increase oral fluids as above. DD:  03/20/00 TD:  03/21/00 Job: [id number]

## 2010-07-03 NOTE — H&P (Signed)
NAME:  Patrick Holt, Patrick Holt                        ACCOUNT NO.:  [id number]   MEDICAL RECORD NO.:  [id number]                   PATIENT TYPE:  INP   LOCATION:  0358                                 FACILITY:  [facility]   PHYSICIAN:  Reuben Likes, M.D.               DATE OF BIRTH:  [birth year]   DATE OF ADMISSION:  05/02/2002  DATE OF DISCHARGE:                                HISTORY & PHYSICAL   CHIEF COMPLAINT:  Hives.   HISTORY OF PRESENT ILLNESS:  Patrick Holt is a 60 year old male with  dermatomyositis who had a three day history of generalized itchy rash,  swelling of the lips and tongue, shortness of breath, wheezing, cough and  continuous substernal chest discomfort, worse with swallowing. The patient  had been on Metronidazole and Cipro for probable diverticulitis although  this was not confirmed on a CT scan since April 09, 2002. He has been on  no other new medications but is on multiple chronic medications. After the  onset of the hives, his Prednisone was increased to 30 mg per day and the  patient was told to take Benadryl. This morning the swelling of the lips and  tongue began and he came to the emergency room. He was slightly better after  IV Solu-Medrol but requested admission for observation.   PAST SURGICAL HISTORY:  The patient had a carpal tunnel release in 1995.  Diskectomy and herniated nucleus pulposis in 2000. Knee surgery in 2000.   PAST MEDICAL HISTORY:  The patient was hospitalized in 2001 twice for IV IG  for dermatomyositis. The second time he reacted to it and was not given it  any more. The dermatomyositis had it's onset in September of 2000. At that  time, he had a minor wrist injury for which he was seen in the walk in  clinic and he received instead of a tetanus injection, a PPD IM and then the  tetanus shot. After that time, he began to have a variety of difficulties  including myalgias, weakness, rashes, hematuria, trouble with bladder  control,  reflux symptoms, esophagitis and gastritis. Eventually, he was  forced to discontinue his work in June of 2001 because of this combination  of symptoms. 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  Also fibromyalgia, gastroesophageal reflux disease, hypertension, anxiety,  depression, chronic obstructive pulmonary disease, asthma and bronchospasm  and history of ischemic heart disease.   MEDICATIONS:  Methotrexate 2.5 mg eight tabs once weekly, Prednisone in  varying doses currently 30 mg per day, Effexor XR 75 mg once daily, folic  acid 1 mg per day, Miacalcin nasal spray one spray in one nostril once  daily, HCTZ 25 mg once daily, Reserpine 0.1 mg once daily, Alprazolam 2 mg  one half to one tab per day as needed, Nexium 40 mg once  or twice a day,  Phenergan 25 mg every four hours as needed nausea, Furosemide 40 mg once  daily as needed edema, potassium chloride 20 mEq once daily when he takes  the Furosemide, Oxycodone APAP 5/325 mg every six to eight hours as needed,  NitroQuick as needed, Advair 50/100 mg twice a day, Glucotrol XL 5 mg once  daily.   ALLERGIES:  PENICILLIN, SULFA, WELLBUTRIN, AND CODEINE (cause some type of  skin rash or breaking out).   FAMILY HISTORY:  Father has obesity and diabetes mellitus. Mother has brain  tumor and died in May 31, 2001 at the age of 31 years. One of his brother's  has hepatitis due to drug use. Another brother has obesity. A sister is in  good health.   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 He is married. Lives with  his wife and step-daughter. Does not use alcohol. Smokes one to two pack per  day of cigarettes.   REVIEW OF SYSTEMS:  He has multiple positive symptoms but denies any  recent  fevers. He complains of constant diarrhea although there is no constipation.  Abdomen is very tender. He has frequent headaches. He is nauseated often. He  has burning pain with urination and his hands and feet have been swelling.  Otherwise, negative.   PHYSICAL EXAMINATION:  VITAL SIGNS: Blood pressure 135/79, pulse 93,  respiratory rate 20, temperature 97.9. O2 sat 93% on room air.  GENERAL: The patient appears cushingoid but is alert and in no respiratory  distress.  SKIN: There is a blotchy erythematous rash on the trunk, arms and legs. No  obvious swelling of the lips or tongue.  HEENT:  Pupils small and poorly reactive. Fundi not well seen. Extraocular  muscles intact. Lids and conjunctiva normal. ENT, Tm's are normal. Mucous  membranes dry. Teeth in poor repair. No intraoral lesions. Pharynx clear.  NECK: Supple. No adenopathy. No jugular venous distention. No bruit. Thyroid  normal.  LUNGS: Clear to auscultation and percussion. No wheezes, rales, or rhonchi.  HEART: Regular rate and rhythm. No gallop or murmur.  ABDOMEN: Soft. There is mild generalized tenderness to palpation without  guarding or rebound. No organomegaly or mass. Bowel sounds are normally  active.  RECTAL: No masses. Normal prostate. Heme negative stool.  EXTREMITIES: No pedal edema. Peripheral pulses are full.  NEURO: Alert and oriented times three. Speech clear and appropriate. No  extremity tremor. He does exhibit mild generalized weakness of the arms and  legs. Deep tendon reflexes 1+ and symmetrical. Babinski's downgoing. Cranial  nerves 2-12 are intact.   ADMISSION IMPRESSION:  1. Urticaria probably reaction to Cipro or Metronidazole.  2. Chest pain probable esophagitis, reflux versus Candida.  3. Dermatomyositis.  4. Type II diabetes mellitus, steroid induced.  5. Fibromyalgia.  6. Ischemic heart disease.  7. Chronic obstructive pulmonary disease.  8. Hypertension.  9. Depression and  anxiety.  PLAN:  1. IV Solu-Medrol.  2. Serial CPK, MB, troponin's and EKG's.                                               Reuben Likes, M.D.    DCK/MEDQ  D:  05/02/2002  T:  05/03/2002  Job:  [id number]

## 2010-07-04 ENCOUNTER — Emergency Department (HOSPITAL_COMMUNITY)
Admission: EM | Admit: 2010-07-04 | Discharge: 2010-07-05 | Disposition: A | Discharge status: Home / Self Care | Payer: Medicare HMO | Attending: Emergency Medicine | Admitting: Emergency Medicine

## 2010-07-04 ENCOUNTER — Emergency Department (HOSPITAL_COMMUNITY): Payer: Medicare HMO

## 2010-07-04 DIAGNOSIS — E785 Hyperlipidemia, unspecified: Secondary | ICD-10-CM | POA: Insufficient documentation

## 2010-07-04 DIAGNOSIS — E119 Type 2 diabetes mellitus without complications: Secondary | ICD-10-CM | POA: Insufficient documentation

## 2010-07-04 DIAGNOSIS — Z79899 Other long term (current) drug therapy: Secondary | ICD-10-CM | POA: Insufficient documentation

## 2010-07-04 DIAGNOSIS — R55 Syncope and collapse: Secondary | ICD-10-CM | POA: Insufficient documentation

## 2010-07-04 DIAGNOSIS — J4489 Other specified chronic obstructive pulmonary disease: Secondary | ICD-10-CM | POA: Insufficient documentation

## 2010-07-04 DIAGNOSIS — R29898 Other symptoms and signs involving the musculoskeletal system: Secondary | ICD-10-CM | POA: Insufficient documentation

## 2010-07-04 DIAGNOSIS — I10 Essential (primary) hypertension: Secondary | ICD-10-CM | POA: Insufficient documentation

## 2010-07-04 DIAGNOSIS — IMO0001 Reserved for inherently not codable concepts without codable children: Secondary | ICD-10-CM | POA: Insufficient documentation

## 2010-07-04 DIAGNOSIS — J449 Chronic obstructive pulmonary disease, unspecified: Secondary | ICD-10-CM | POA: Insufficient documentation

## 2010-07-04 DIAGNOSIS — F411 Generalized anxiety disorder: Secondary | ICD-10-CM | POA: Insufficient documentation

## 2010-07-04 LAB — DIFFERENTIAL
Basophils Absolute: 0 10*3/uL (ref 0.0–0.1)
Eosinophils Absolute: 0 10*3/uL (ref 0.0–0.7)
Eosinophils Relative: 0 % (ref 0–5)
Lymphocytes Relative: 6 % — ABNORMAL LOW (ref 12–46)
Lymphs Abs: 0.9 10*3/uL (ref 0.7–4.0)
Neutrophils Relative %: 86 % — ABNORMAL HIGH (ref 43–77)

## 2010-07-04 LAB — POCT I-STAT, CHEM 8
Chloride: 103 mEq/L (ref 96–112)
Glucose, Bld: 115 mg/dL — ABNORMAL HIGH (ref 70–99)
HCT: 48 % (ref 39.0–52.0)
Hemoglobin: 16.3 g/dL (ref 13.0–17.0)
Potassium: 3.1 mEq/L — ABNORMAL LOW (ref 3.5–5.1)

## 2010-07-04 LAB — CBC
HCT: 44.8 % (ref 39.0–52.0)
Platelets: 234 10*3/uL (ref 150–400)
RBC: 4.48 MIL/uL (ref 4.22–5.81)
RDW: 15.3 % (ref 11.5–15.5)
WBC: 13.7 10*3/uL — ABNORMAL HIGH (ref 4.0–10.5)

## 2010-07-06 ENCOUNTER — Encounter: Payer: Self-pay | Admitting: Family Medicine

## 2010-07-06 ENCOUNTER — Emergency Department (HOSPITAL_COMMUNITY): Payer: Medicare HMO

## 2010-07-06 ENCOUNTER — Inpatient Hospital Stay (HOSPITAL_COMMUNITY)
Admission: EM | Admit: 2010-07-06 | Discharge: 2010-07-10 | DRG: 101 | Disposition: A | Discharge status: Skilled Nursing Facility | Payer: Medicare HMO | Attending: Family Medicine | Admitting: Family Medicine

## 2010-07-06 DIAGNOSIS — G40909 Epilepsy, unspecified, not intractable, without status epilepticus: Principal | ICD-10-CM | POA: Diagnosis present

## 2010-07-06 DIAGNOSIS — I7300 Raynaud's syndrome without gangrene: Secondary | ICD-10-CM | POA: Diagnosis present

## 2010-07-06 DIAGNOSIS — K219 Gastro-esophageal reflux disease without esophagitis: Secondary | ICD-10-CM | POA: Diagnosis present

## 2010-07-06 DIAGNOSIS — J4489 Other specified chronic obstructive pulmonary disease: Secondary | ICD-10-CM | POA: Diagnosis present

## 2010-07-06 DIAGNOSIS — E119 Type 2 diabetes mellitus without complications: Secondary | ICD-10-CM | POA: Diagnosis present

## 2010-07-06 DIAGNOSIS — J449 Chronic obstructive pulmonary disease, unspecified: Secondary | ICD-10-CM | POA: Diagnosis present

## 2010-07-06 DIAGNOSIS — M3390 Dermatopolymyositis, unspecified, organ involvement unspecified: Secondary | ICD-10-CM | POA: Diagnosis present

## 2010-07-06 DIAGNOSIS — M3313 Other dermatomyositis without myopathy: Secondary | ICD-10-CM | POA: Diagnosis present

## 2010-07-06 DIAGNOSIS — IMO0001 Reserved for inherently not codable concepts without codable children: Secondary | ICD-10-CM | POA: Diagnosis present

## 2010-07-06 DIAGNOSIS — M069 Rheumatoid arthritis, unspecified: Secondary | ICD-10-CM | POA: Diagnosis present

## 2010-07-06 DIAGNOSIS — F17200 Nicotine dependence, unspecified, uncomplicated: Secondary | ICD-10-CM | POA: Diagnosis present

## 2010-07-06 DIAGNOSIS — N3941 Urge incontinence: Secondary | ICD-10-CM | POA: Diagnosis present

## 2010-07-06 LAB — DIFFERENTIAL
Basophils Absolute: 0 10*3/uL (ref 0.0–0.1)
Lymphocytes Relative: 19 % (ref 12–46)
Lymphs Abs: 2.1 10*3/uL (ref 0.7–4.0)
Neutro Abs: 7.8 10*3/uL — ABNORMAL HIGH (ref 1.7–7.7)

## 2010-07-06 LAB — COMPREHENSIVE METABOLIC PANEL
ALT: 22 U/L (ref 0–53)
AST: 35 U/L (ref 0–37)
Albumin: 3.9 g/dL (ref 3.5–5.2)
Alkaline Phosphatase: 82 U/L (ref 39–117)
Chloride: 98 mEq/L (ref 96–112)
GFR calc Af Amer: >60 mL/min (ref >60)
Potassium: 2.5 mEq/L — CL (ref 3.5–5.1)
Sodium: 139 mEq/L (ref 135–145)
Total Protein: 7.2 g/dL (ref 6.0–8.3)

## 2010-07-06 LAB — CBC
HCT: 44.9 % (ref 39.0–52.0)
Hemoglobin: 15.7 g/dL (ref 13.0–17.0)
MCV: 98.7 fL (ref 78.0–100.0)
WBC: 10.9 10*3/uL — ABNORMAL HIGH (ref 4.0–10.5)

## 2010-07-06 LAB — GLUCOSE, CAPILLARY: Glucose-Capillary: 90 mg/dL (ref 70–99)

## 2010-07-06 LAB — PROTIME-INR: INR: 0.99 (ref 0.00–1.49)

## 2010-07-06 LAB — URINALYSIS, ROUTINE W REFLEX MICROSCOPIC
Glucose, UA: NEGATIVE mg/dL
Hgb urine dipstick: NEGATIVE
Nitrite: NEGATIVE
Specific Gravity, Urine: 1.024 (ref 1.005–1.030)
pH: 6 (ref 5.0–8.0)

## 2010-07-06 LAB — LIPASE, BLOOD: Lipase: 18 U/L (ref 11–59)

## 2010-07-06 LAB — URINE MICROSCOPIC-ADD ON

## 2010-07-06 LAB — APTT: aPTT: 31 seconds (ref 24–37)

## 2010-07-06 LAB — RAPID URINE DRUG SCREEN, HOSP PERFORMED
Amphetamines: NOT DETECTED
Benzodiazepines: POSITIVE — AB

## 2010-07-06 NOTE — Progress Notes (Unsigned)
Family Medicine Teaching [facility] Admission History and Physical  Patient name: Patrick Holt Medical record number: [id number] Date of birth: January 22, 1951 Age: 60 y.o. Gender: male  Primary Care Provider: Rodney Langton, MD  Chief Complaint: AMS/seizure  History of Present Illness: Patrick Holt is a 60 y.o. year old male presenting with AMS.  Pt was found in yard by neighbor confused and acting strangely.  Pt had his bank bill in his hand and was asking "how did this happen."  Police were called and found pt as described by neighbor so EMS was notified to transport pt to ER.  Workup was initiated in the ER.  K found to be 2.5.  Otherwise workup negative.  After a few hours in the ER pt had sudden onset of seizure that was witnessed by ER staff.  ER provider described seizure as pt staring off, with repeatative chewing motion, and decorticate posturing with left arm. Seizure resolved with ativan 2mg .   No known hx of seizure.  Pt states that he doesn't know why he is here.  His thoughts are tangential and asks the same question multiple times to staff.  Continued to show bank bill to ER staff and asked them to explain what happened.  Pt states that there should be more money in the bank at this point in the month.    History obtained from ER and EMS records.  Unable to obtain ROS since pt thoughts tangential, unable to focus on questions being asked.   Patient Active Problem List  Diagnoses  . DIABETES MELLITUS, TYPE II  . HYPERLIPIDEMIA  . OBESITY, UNSPECIFIED  . DEPRESSION, MAJOR  . ANXIETY, CHRONIC  . TOBACCO USER  . HYPERTENSION  . ISCHEMIC HEART DISEASE  . RAYNAUD'S DISEASE  . HEMORRHOIDS, INTERNAL  . COPD  . ESOPHAGEAL MOTILITY DISORDER  . GERD  . HIATAL HERNIA  . DIVERTICULOSIS OF COLON  . DERMATOMYOSITIS  . RHEUMATOID ARTHRITIS  . FIBROMYALGIA  . VERTIGO  . GENERALIZED PAIN  . TREMOR, RIGHT HAND  . Urge incontinence  . Irritable bowel syndrome (IBS)     Past Medical History: See active problem list above  Paroxysmal atrial fibrillation in intensive care unit during 07/25/09 admission   Past Surgical History:  Knee surgery for meniscal tear status post  lumbar diskectomy.   Social History: Pt lives alone, drinks 2 glasses of wine per day, smokes 1 ppd. Denies drug use.  Family History:  Diabetes, coronary artery disease.  No family   history of colorectal cancer. (obtained from previous h and p)  Allergies: Allergies  Allergen Reactions  . Advair Hfa   . Betamethasone Dipropionate   . Bupropion Hcl   . Ciprofloxacin     REACTION: swelling  . Clobetasol   . Codeine   . Escitalopram Oxalate   . Fluoxetine Hcl   . Furosemide   . Metronidazole     REACTION: swelling  . Paroxetine   . Penicillins   . Statins   . Tacrolimus   . Tetanus Toxoid   . Tuberculin Purified Protein Derivative     Current Outpatient Prescriptions  Medication Sig Dispense Refill  . albuterol-ipratropium (COMBIVENT) 18-103 MCG/ACT inhaler Inhale 2 puffs into the lungs every 4 (four) hours as needed. For shortness of breath/wheeze       . ALPRAZolam (XANAX) 1 MG tablet Take 1 mg by mouth 3 (three) times daily as needed.        Marland Kitchen aspirin (ASPIR-81) 81 MG EC tablet  Take 81 mg by mouth daily.        . DULoxetine (CYMBALTA) 60 MG capsule Take 60 mg by mouth daily.        Marland Kitchen esomeprazole (NEXIUM) 40 MG capsule Take 40 mg by mouth daily.        . folic acid (FOLVITE) 1 MG tablet Take 1 mg by mouth as directed. One tab by mouth on days not taking methotrexate       . hydrocortisone 2.5 % ointment Apply topically 2 (two) times daily. Apply to itchy areas two times daily       . lisinopril-hydrochlorothiazide (PRINZIDE,ZESTORETIC) 10-12.5 MG per tablet Take 1 tablet by mouth daily.  30 tablet  11  . meclizine (ANTIVERT) 25 MG tablet Take 25 mg by mouth every 8 (eight) hours as needed. For vertigo       . methotrexate (RHEUMATREX) 2.5 MG tablet Take 3 tablets  (7.5 mg total) by mouth once a week.  12 tablet  5  . metoprolol (LOPRESSOR) 50 MG tablet Take 50 mg by mouth 2 (two) times daily.        . mometasone (NASONEX) 50 MCG/ACT nasal spray 2 sprays by Nasal route 2 (two) times daily. 2 sprays in each nostril       . niacin (NIASPAN) 1000 MG CR tablet Take 1,000 mg by mouth at bedtime.        . nitroGLYCERIN (NITROSTAT) 0.4 MG SL tablet Place 0.4 mg under the tongue as needed.        Marland Kitchen oxybutynin (DITROPAN XL) 10 MG 24 hr tablet Take 1 tablet (10 mg total) by mouth daily.  30 tablet  11  . predniSONE (DELTASONE) 5 MG tablet Take 5 mg by mouth daily.        . promethazine (PHENERGAN) 25 MG tablet Take 25 mg by mouth every 6 (six) hours as needed. For nausea       . Psyllium 100 % PACK Take 1 packet by mouth 3 (three) times daily between meals.  1 Bottle  0  . rOPINIRole (REQUIP) 2 MG tablet Take 2 mg by mouth 3 (three) times daily.        Marland Kitchen torsemide (DEMADEX) 10 MG tablet Take 10 mg by mouth daily.          Physical Exam: Pulse: 80  Blood Pressure: 135/59 RR: 18   O2: 100% on RA Temp: 98.7  General: alert, confused, mild agitation when I asked questions, repeating the same questions to staff multiple times. 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  Mucous membranes dry.  Heart: S1 and S2 normal, regular rate and rhythm, no edema or JVD Lungs: clear to auscultation, no wheezes or rales and unlabored breathing Abdomen: abdomen is soft without significant tenderness, masses, organomegaly or guarding Extremities: extremities normal, atraumatic, no cyanosis or edema Skin:no rashes, +hyperpigmented macules scattered on lower ext bilateral, foot exam- callous to heels bilateral left > right.  No open lesions on feet.  Neurology: normal without focal findings, mental status, speech normal, alert and oriented x3, PERLA and reflexes normal and symmetric, thoughts tangential,  pt doesn't seem to retain information given short term, moving all 4 extremities. CN 2-12 grossly intact.   Labs and Imaging: Lab Results  Component Value Date/Time   NA 139 07/06/2010  5:50 PM   K 2.5 CRITICAL RESULT CALLED TO, READ BACK BY AND VERIFIED  WITH: C CHATTEN,RN 1852 07/06/10 D BRADLEY* 07/06/2010  5:50 PM   CL 98 07/06/2010  5:50 PM   CO2 28 07/06/2010  5:50 PM   BUN 9 07/06/2010  5:50 PM   CREATININE 0.68 07/06/2010  5:50 PM   CREATININE 0.82 05/15/2010 12:17 PM   GLUCOSE 82 07/06/2010  5:50 PM   Lab Results  Component Value Date   WBC 10.9* 07/06/2010   HGB 15.7 07/06/2010   HCT 44.9 07/06/2010   MCV 98.7 07/06/2010   PLT 243 07/06/2010    Alcohol level <11  Lipase 18  U/A- moderate bili in urine, >80 ketones, TP- 30   Urine micro- rare epithelials, rare bacteria  Urine drug- + benzos  CT of head w/o contrast - stable atrophy white matter disease, likely reflecting chronic microvascular ischemia.  No acute intracranial abnormality. Atherosclerosis.  CXR- Emphysematous and bronchitic changes. Minimal bibasilar atelectasis.   EKG- NSR with no ST elevation.  Assessment and Plan: Patrick Holt is a 59 y.o. year old male presenting with AMS and seizures: 1. AMS -unsure of cause of confusion.  Seizure is the most likely cause of AMS.  Pt lives alone, could have had seizure prior to being seen confused by neighbor.  Then had a repeat seizure in ER?  Also, need to r/o infectious cause of confusion.  Lactic acid and pro calcitonin pending.   CXR clear.  U/A indeterminant- urine cx pending.  Blood cx x 2 pending.  Stroke could be cause of AMS- CT of head negative.  Could consider obtaining MRI-- will discuss with FPTS team in AM.  Pt also on multiple drugs that could cause confusion- will hold methotrexate (has been related to encephalopathy and seizures), will hold ditropan (side effect of seizures),  Will hold phenergan, meclizine and  requip also.  Most likely confusion 2/2  seizure but may be complicated by other factors such as infection or medications as discussed above.   2. Seizure -new onset seizures.  Ativan 1mg  IV if any further seizure activity.  Seizure precautions.  Admitted to stepdown ICU for close monitoring.  3.Diabetes -pt is not on any home medications for diabetes.  Will monitor CBG's/  4.Hyperlipidemia -continue niacin per home regimen  5. Depression/anxiety -will continue cymbalta  per home regimen.  Will write for ativan as per CIWA protocol and for seizures.    6.Tobacco abuse/alcohol use -will write for nicotine patch if needed.  Unsure of exact amount of etoh use daily.  Will place on ciwa protocol.   7.HTN Will continue pt home regimen of lisinopril/hctz 10/12.5 po daily, demadex 10mg  po daily, metoprolol 50mg  po bid,   8.Autoimmune diseases: raynaud's and dermatomyositis -will continue predinsoe 5mg  daily, will hold methotrexate.   9.Fibromyalgia/generalized pain  - will monitor for pain.  Will hold all pain meds at this time 2/2 ams.  10.Urge incontinence -will hold ditropan for now since it has known adverse reaction of seizures.   11. Copd -continue combivent prn  12.gerd - will give protonix 40mg  IV daily.  13. FEN/GI: npo until pass swallow eval  14. Prophylaxis: protonix, heparin 5000units TID SQ  15. Disposition: pending clinical improvement.  dicatation [phone]

## 2010-07-07 ENCOUNTER — Inpatient Hospital Stay (HOSPITAL_COMMUNITY): Payer: Medicare HMO

## 2010-07-07 DIAGNOSIS — R569 Unspecified convulsions: Secondary | ICD-10-CM

## 2010-07-07 DIAGNOSIS — F1020 Alcohol dependence, uncomplicated: Secondary | ICD-10-CM

## 2010-07-07 LAB — CARDIAC PANEL(CRET KIN+CKTOT+MB+TROPI)
Total CK: 496 U/L — ABNORMAL HIGH (ref 7–232)
Total CK: 405 U/L — ABNORMAL HIGH (ref 7–232)
Troponin I: <0.3 ng/mL (ref <0.30)

## 2010-07-07 LAB — PHOSPHORUS: Phosphorus: 2.4 mg/dL (ref 2.3–4.6)

## 2010-07-07 LAB — COMPREHENSIVE METABOLIC PANEL
ALT: 19 U/L (ref 0–53)
AST: 27 U/L (ref 0–37)
Albumin: 3.1 g/dL — ABNORMAL LOW (ref 3.5–5.2)
Alkaline Phosphatase: 71 U/L (ref 39–117)
CO2: 28 mEq/L (ref 19–32)
Chloride: 101 mEq/L (ref 96–112)
Creatinine, Ser: 0.53 mg/dL (ref 0.4–1.5)
GFR calc Af Amer: >60 mL/min (ref >60)
GFR calc non Af Amer: >60 mL/min (ref >60)
Potassium: 3.2 mEq/L — ABNORMAL LOW (ref 3.5–5.1)
Total Bilirubin: 1.3 mg/dL — ABNORMAL HIGH (ref 0.3–1.2)

## 2010-07-07 LAB — BASIC METABOLIC PANEL
CO2: 28 mEq/L (ref 19–32)
CO2: 29 mEq/L (ref 19–32)
Chloride: 103 mEq/L (ref 96–112)
GFR calc Af Amer: >60 mL/min (ref >60)
Glucose, Bld: 100 mg/dL — ABNORMAL HIGH (ref 70–99)
Glucose, Bld: 155 mg/dL — ABNORMAL HIGH (ref 70–99)
Potassium: 2.5 mEq/L — CL (ref 3.5–5.1)
Potassium: 3.5 mEq/L (ref 3.5–5.1)
Sodium: 139 mEq/L (ref 135–145)
Sodium: 137 mEq/L (ref 135–145)

## 2010-07-07 LAB — GLUCOSE, CAPILLARY
Glucose-Capillary: 100 mg/dL — ABNORMAL HIGH (ref 70–99)
Glucose-Capillary: 68 mg/dL — ABNORMAL LOW (ref 70–99)
Glucose-Capillary: 103 mg/dL — ABNORMAL HIGH (ref 70–99)
Glucose-Capillary: 173 mg/dL — ABNORMAL HIGH (ref 70–99)

## 2010-07-07 LAB — URINE CULTURE: Culture  Setup Time: 201205212139

## 2010-07-07 LAB — CBC
Hemoglobin: 14.2 g/dL (ref 13.0–17.0)
MCH: 33.6 pg (ref 26.0–34.0)
MCHC: 33.7 g/dL (ref 30.0–36.0)
MCV: 99.8 fL (ref 78.0–100.0)
Platelets: 218 10*3/uL (ref 150–400)

## 2010-07-07 LAB — MRSA PCR SCREENING: MRSA by PCR: NEGATIVE

## 2010-07-07 MED ORDER — GADOBENATE DIMEGLUMINE 529 MG/ML IV SOLN
20.0000 mL | Freq: Once | INTRAVENOUS | Status: AC
  Administered 2010-07-07: 20 mL via INTRAVENOUS

## 2010-07-08 LAB — GLUCOSE, CAPILLARY
Glucose-Capillary: 123 mg/dL — ABNORMAL HIGH (ref 70–99)
Glucose-Capillary: 139 mg/dL — ABNORMAL HIGH (ref 70–99)
Glucose-Capillary: 126 mg/dL — ABNORMAL HIGH (ref 70–99)
Glucose-Capillary: 131 mg/dL — ABNORMAL HIGH (ref 70–99)
Glucose-Capillary: 134 mg/dL — ABNORMAL HIGH (ref 70–99)

## 2010-07-08 LAB — BASIC METABOLIC PANEL
Calcium: 9.4 mg/dL (ref 8.4–10.5)
GFR calc Af Amer: >60 mL/min (ref >60)
GFR calc non Af Amer: >60 mL/min (ref >60)
Potassium: 3.6 mEq/L (ref 3.5–5.1)
Sodium: 141 mEq/L (ref 135–145)

## 2010-07-08 LAB — CBC
HCT: 43.6 % (ref 39.0–52.0)
Hemoglobin: 14.8 g/dL (ref 13.0–17.0)
MCHC: 33.9 g/dL (ref 30.0–36.0)
RDW: 15.3 % (ref 11.5–15.5)
WBC: 7 10*3/uL (ref 4.0–10.5)

## 2010-07-08 NOTE — Procedures (Unsigned)
EEG NUMBER:  REFERRING PHYSICIAN:  Leighton Roach McDiarmid, MD.  DATE OF STUDY:  Jul 07, 2010.  HISTORY:  The patient is a 60 year old man with altered mental status and confusion.  EEG is for further evaluation.  MEDICATIONS:  He is on morphine, Ativan, K-Dur, thiamine, folate.  His EEG duration is 2 minutes of EEG recording.  EEG DESCRIPTION:  This is a routine 18 channel adult EEG recording with one channel devoted to limited EKG recording.  Activation procedure is performed during the photic stimulation.  The study is performed in awake and mostly sleep state.  As the EEG opens up, I noticed that the majority of the study segment is reflective of sleep state with well-formed synchronous spindles and K complexes.  The patient does wake up in between and the posterior dominant rhythm is noted to be 9 Hz frequency with a low amplitude of 16 microvolts at the most.  I do not see any asymmetry or any abnormal slowing during the study.  I do not see any electrographic seizure or epileptiform discharges during the study.  No driving was noted with the photic stimulation in posterior leads.  EEG INTERPRETATION:  This is a normal awake and sleep EEG.  There is no evidence to suggest electrographic seizures or epileptiform discharges based on this study.  Majority of the study is reflective of sleep segment with superimposed beta activity which is commonly seen in patients who are on sedating medications.  There is no evidence for electrographic seizures or epileptiform discharges.          ______________________________ Levie Heritage, MD    [id number] D:  07/07/2010 16:16:40  T:  07/08/2010 06:10:21  Job #:  [id number]

## 2010-07-09 DIAGNOSIS — F333 Major depressive disorder, recurrent, severe with psychotic symptoms: Secondary | ICD-10-CM

## 2010-07-09 LAB — GLUCOSE, CAPILLARY
Glucose-Capillary: 125 mg/dL — ABNORMAL HIGH (ref 70–99)
Glucose-Capillary: 110 mg/dL — ABNORMAL HIGH (ref 70–99)

## 2010-07-09 LAB — BASIC METABOLIC PANEL
BUN: 5 mg/dL — ABNORMAL LOW (ref 6–23)
Chloride: 103 mEq/L (ref 96–112)
Creatinine, Ser: 0.5 mg/dL (ref 0.4–1.5)
Glucose, Bld: 99 mg/dL (ref 70–99)

## 2010-07-09 LAB — CBC
HCT: 43.1 % (ref 39.0–52.0)
MCH: 34.6 pg — ABNORMAL HIGH (ref 26.0–34.0)
MCV: 98.9 fL (ref 78.0–100.0)
Platelets: 253 10*3/uL (ref 150–400)
RDW: 15 % (ref 11.5–15.5)

## 2010-07-10 LAB — GLUCOSE, CAPILLARY

## 2010-07-13 LAB — CULTURE, BLOOD (ROUTINE X 2)
Culture  Setup Time: 201205221303
Culture: NO GROWTH

## 2010-07-14 ENCOUNTER — Ambulatory Visit (INDEPENDENT_AMBULATORY_CARE_PROVIDER_SITE_OTHER): Payer: Medicare HMO | Admitting: Sports Medicine

## 2010-07-14 ENCOUNTER — Encounter: Payer: Self-pay | Admitting: Sports Medicine

## 2010-07-14 DIAGNOSIS — I10 Essential (primary) hypertension: Secondary | ICD-10-CM

## 2010-07-14 DIAGNOSIS — R52 Pain, unspecified: Secondary | ICD-10-CM

## 2010-07-14 DIAGNOSIS — F329 Major depressive disorder, single episode, unspecified: Secondary | ICD-10-CM

## 2010-07-14 MED ORDER — OXYCODONE HCL 5 MG PO TABS: 5.0000 mg | ORAL_TABLET | Freq: Three times a day (TID) | ORAL | Status: DC | PRN

## 2010-07-14 NOTE — Progress Notes (Signed)
  Subjective:    Patient ID: Patrick Holt, male    DOB: June 13, 1950, 60 y.o.   MRN: [id number]  HPI  Pt arrived almost 30 mins late for appt.  Pt recently in hospital for AMS:  MRI brain unremarkable, EEG unremarkable.  Seen by psych and dx depression with psychotic features.  Seroquel started.  No sz seen in hospital.  Presumptive pseudoseizures vs benzo withdrawal though discharge summary not available, d/w physician who cared for him in hospital.  He now has [facility], psychiatrist.  CSW to work with him re: inadequate material resources.  Needs refill on oxycodone.  Had several other problems that he will make another appt to discuss.  Review of Systems    See HPI Objective:   Physical Exam  Constitutional: He appears well-developed and well-nourished. No distress.  Cardiovascular: Normal rate, regular rhythm and normal heart sounds.  Exam reveals no gallop and no friction rub.   No murmur heard. Pulmonary/Chest: Effort normal and breath sounds normal. No respiratory distress. He has no wheezes. He has no rales. He exhibits no tenderness.  Skin: Skin is warm and dry.          Assessment & Plan:

## 2010-07-14 NOTE — Assessment & Plan Note (Signed)
Refilled oxycodone, refill q 2 months.

## 2010-07-14 NOTE — Assessment & Plan Note (Signed)
With psychotic features. Started seroquel in hospital by psychiatrist.

## 2010-07-15 NOTE — H&P (Signed)
Patrick Holt, Patrick Holt NO.:  [id number]  MEDICAL RECORD NO.:  [id number]           PATIENT TYPE:  E  LOCATION:  [facility]                         FACILITY:  [facility]  PHYSICIAN:  Leighton Roach Teddi Badalamenti, M.D.DATE OF BIRTH:  03/09/1950  DATE OF ADMISSION:  07/06/2010 DATE OF DISCHARGE:                             HISTORY & PHYSICAL   PRIMARY CARE PROVIDER:  Monica Becton, MD  CHIEF COMPLAINT:  Altered mental status and seizure.  HISTORY OF PRESENT ILLNESS:  Patrick Holt is a 60 year old man who presented to the ER with altered mental status.  The patient was found in his yard by his neighbor confused and acting strangely.  He had a bank bill in his hand and was asking how did this happen and also saying other strange things.  Police were called and the patient was found as described by neighbors.  EMS was notified to transport the patient to the ER for workup.  Workup was initiated in the ER.  Potassium was found to be 2.5, otherwise workup negative.  After a few hours in the ER, the patient had sudden onset of seizure that was witnessed by ER staff.  ER provider described seizure as patient is staring off with repetitive chewing motion and decorticate posturing with left arm.  Seizure resolved with Ativan 2 mg.  No known history of seizure.  The patient states that he does not know why he is here in the emergency department. His thoughts were tangential and asked the same question multiple times to staff members.  He continued to show bank bill to ER staff and asked them to explain what happened.  Unable to obtain detailed history for HPI secondary to the patient's altered mental status.  Therefore, history obtained mainly from the ER and EMS records.  Unable to obtain review of systems since the patient's thought was tangential and unable to focus on the questions that were being asked.  PAST MEDICAL HISTORY: 1. Diabetes. 2. Hyperlipidemia. 3. Obesity. 4.  Depression. 5. Anxiety. 6. Tobacco abuse. 7. Hypertension. 8. Ischemic heart disease. 9. Raynaud's disease. 10.Hemorrhoids internal. 11.COPD. 12.Esophageal motility disorder. 13.GERD. 14.Hiatal hernia. 15.Diverticulosis of colon. 16.Dermatomyositis. 17.Rheumatoid arthritis. 18.Fibromyalgia. 19.Vertigo. 20.Generalized pain. 21.Right hand tremor. 22.Urge incontinence. 23.IBS. 24.Paroxysmal AFib in Intensive Care Unit during July 25, 2009     admission.  PAST SURGICAL HISTORY:  Knee surgery for meniscal tear and status post lumbar diskectomy.  SOCIAL HISTORY:  The patient lives alone.  He drinks 2 glasses of wine per day.  Smokes 1 pack per day.  Denies drug use.  FAMILY HISTORY:  Diabetes, coronary artery disease.  No family history of colorectal cancer.  This was obtained from previous H&P from previous admission.  ALLERGIES: 1. ADVAIR. 2. BETAMETHASONE. 3. BUPROPION. 4. CIPROFLOXACIN. 5. CLOBETASOL. 6. CODEINE. 7. CITALOPRAM. 8. FLUOXETINE. 9. FUROSEMIDE. 10.METRONIDAZOLE.11.PAROXETINE. 12.PENICILLIN. 13.STATIN. 14.TACROLIMUS. 15.TETANUS. 16.TUBERCULIN PURIFIED PROTEIN DERIVATIVE.  MEDICATIONS ON OUTPATIENT MED LIST: 1. Combivent 2 puffs q.4 h. p.r.n. for shortness of breath and     wheezing. 2. Xanax 1 mg t.i.d. p.r.n. 3. Aspirin 81 mg p.o. daily. 4. Cymbalta 60 mg daily. 5. Nexium 40  mg daily. 6. Folic acid 1 mg as directed. 7. Hydrocortisone 2.5 ointment to affected areas b.i.d. 8. Lisinopril/HCTZ 10/12.5, 1 tablet daily. 9. Antivert 25 mg p.o. q.8 h. as needed. 10.Methotrexate, take 3 tablets by mouth once a week. 11.Lopressor 50 mg by mouth b.i.d. 12.Nasonex 2 sprays in each nostril b.i.d. 13.Niacin 1000 mg by mouth at bedtime. 14.Nitroglycerin 0.4 mg under the tongue as needed. 15.Ditropan 10 mg by mouth daily. 16.Prednisone 5 mg daily. 17.Promethazine 25 mg q.6 h. p.r.n. 18.Psyllium 1 pack by mouth 3 times daily between meals. 19.ReQuip 2  mg by mouth 3 times a day. 20.Demadex 2 mg by mouth daily.  PHYSICAL EXAMINATION:  VITAL SIGNS:  Pulse 80, blood pressure 135/59, respiratory rate 18, pulse ox 100% on room air, temperature 98.7. GENERAL:  Alert, confused, mild agitation when asked questions, repeating the same questions to staff members. HEENT:  Pupils equal, round, reactive to light and accommodation on left.  Right eye is dilated and fixed.  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  Oropharynx clear.  Mucous membranes dry. HEART:  S1, S2 normal.  Regular rate and rhythm.  No edema or JVD. LUNGS:  Clear to auscultation.  No wheezes or rales.  The patient has unlabored breathing. ABDOMEN:  Soft, nontender, nondistended.  No masses, no guarding. EXTREMITIES:  Normal, atraumatic.  No cyanosis or edema except for on foot exam, calluses to heels, bilateral left greater than right.  No open lesions on feet. SKIN:  No rashes.  Positive hyperpigmented macules scattered on lower extremities bilateral. NEUROLOGIC:  Normal without focal findings.  Mental status alert and oriented x3.  Thought tangential.  The speech is normal.  The patient does not seem to retain information given short-term.  Moving all 4 extremities.  Cranial nerves II through XII grossly intact.  LABS AND IMAGING: 1. Sodium 139, potassium 2.5, chloride 98, BUN 9, creatinine 0.68,     glucose 82.  White blood cells 10.9, hemoglobin 15.7, platelets     243,000.  Alcohol level less than 11, lipase 18.  UA, moderate bili     in urine, greater than 80 ketones, total protein 30.  Urine micro,     rare epithelials, rare bacteria.  Urine drug is positive for     benzos.  CT of head without contrast, stable atrophy, white matter     disease likely reflecting chronic microvascular ischemia, no acute     intracranial abnormality, atherosclerosis. 2. Chest x-ray, emphysematous and bronchitic changes, minimal     bibasilar atelectasis. 3.  EKG, normal sinus rhythm with no ST elevation.  ASSESSMENT/PLAN:  Patrick Holt is a 60 year old man presenting with altered mental status and seizures. 1. Altered mental status, unsure of cause of confusion.  Seizure is     the most likely cause of altered mental status.  The patient lives     alone, could have had seizure prior to being seen confused by     neighbor.  He did have repeated seizure in the ER ? also need to     rule out infectious cause of confusion.  Lactic acid and     procalcitonin pending.  Chest x-ray clear.  UA, indeterminate UA,     urine culture pending.  Blood culture x2 pending.  Stroke could be     cause of altered mental status.  CT of head negative.  Could     consider obtaining MRI.  We will discuss  with [facility]     Teaching Service team in a.m.  The patient is also on multiple     drugs which can cause confusion.  We will hold methotrexate as it     is related to encephalopathy and seizures.  We will also hold     Ditropan which has side effects of seizures.  We will also hold     Phenergan, meclizine, and ReQuip.  Most likely confusion is     secondary to the seizure but may be complicated by other factors     such as infection or medications as discussed above. 2. Seizure.  New onset seizures.  Ativan 1 mg IV if any further     seizure activity.  Seizure precautions, admitted to step-down ICU     for close monitoring. 3. Diabetes.  The patient is on home medications for diabetes.  We     will monitor CBGs. 4. Hyperlipidemia.  Continue niacin for home regimen. 5. Depression, anxiety.  We will continue Cymbalta per home regimen.     We will write for Ativan as per CIWA protocol for seizures. 6. Tobacco abuse.  We will write for nicotine patch.  Also has history     of alcohol use, unsure of amount the patient drinks on a daily     basis, states 2 glasses of wine per day but the patient is     currently altered, so unsure of accuracy.  We will  place on CIWA     protocol. 7. Hypertension.  We will continue the patient's home regimen of     lisinopril/HCTZ 10/12.5 p.o. daily and Demadex 10 mg p.o. daily and     metoprolol 50 mg p.o. b.i.d. when the patient is cleared for p.o.     intake. 8. Autoimmune diseases, Raynaud's, and dermatomyositis.  We will     continue prednisone 5 mg daily.  We will hold methotrexate. 9. Fibromyalgia and generalized pain.  We will monitor for pain.  We     will hold all pain meds at this time secondary to altered mental     status. 10.Urge incontinence.  We will hold Ditropan for now since it has a     known adverse reaction of seizures. 11.Chronic obstructive pulmonary disease.  We will continue Combivent     p.r.n. 12.Gastroesophageal reflux disease.  We will give Protonix 40 mg IV     daily. 13.Fluids, electrolytes, nutrition/gastrointestinal.  N.p.o. currently     until passed swallow eval. 14.Prophylaxis.  Protonix and heparin 5000 units t.i.d. subcu. 15.Disposition pending clinical improvement.     Ellin Mayhew, MD   ______________________________ Etta Grandchild, M.D.    DC/MEDQ  D:  07/07/2010  T:  07/07/2010  Job:  [id number]  Electronically Signed by Ellin Mayhew  on 07/09/2010 08:59:04 AM Electronically Signed by Acquanetta Belling M.D. on 07/15/2010 08:49:22 AM

## 2010-07-15 NOTE — Consult Note (Signed)
NAMESAFAL, Patrick Holt NO.:  [id number]  MEDICAL RECORD NO.:  [id number]           PATIENT TYPE:  LOCATION:                                 FACILITY:  PHYSICIAN:  Patrick Ditch, MD DATE OF BIRTH:  [birth year]  DATE OF CONSULTATION:  07/09/2010 DATE OF DISCHARGE:                                CONSULTATION   REASON FOR CONSULTATION:  Conversion disorder.  HISTORY OF PRESENT ILLNESS:  A 60 year old Caucasian male who was admitted because of altered mental status.  At the time of admission, the patient was very confused, was asking everybody about the bank bill in his hand.  The patient told me that it is very hard for him to find the words, but after little delay he started speaking.  The patient reported that "a lot of pain in the lower back" also reported "I think I have ruptured my disk."  The patient reported that he is followed by Dr. Christy Holt and Dr. Jerry Holt in the outpatient setting and for his depression he was given Cymbalta and Xanax.  The patient reported that, in the past he was tried on Prozac and Lexapro, but on these medication he started thinking about killing himself and when they changed it to Paxil, his suicidal ideations increased, but later on he was put on Cymbalta and he is doing much better on Cymbalta along with Xanax.  On asking about the stress, the patient told me that "people criticize me and I feel bad about that.:  He also reported he is getting of bills a lot and he do not have money to pay the bills and then they are sending the bills to the [employer].  He also reported that his ex-wife and stepdaughters "they think I hate them."  The patient reported that his wife left when his daughter was 58.  On asking about the voices, the patient told me "I pray a lot" "I also talked with the Patrick Holt spirits" he reported he says he is my advocate to heavenly father.  The patient was redirectable during the interview.   Denies any suicidal ideations.  On asking about any past suicide attempt, he reported that in 2003 he cut his wrist and throw himself out of the window.  SOCIAL HISTORY:  The patient lives alone.  SUBSTANCE ABUSE HISTORY:  The patient denies any illicit drug abuse or abusing alcohol.  PAST MEDICAL HISTORY:  The patient has a history of diabetes, hyperlipidemia, hypertension, ischemic heart disease, Raynaud's disease, hemorrhoids, COPD, GERD, hiatal hernia, diverticulitis of colon, dermatosis, rheumatoid arthritis, fibromyalgia, vertigo, IBS, paroxysmal atrial fibrillation.  MENTAL STATUS EXAMINATION:  The patient was cooperative during the interview.  Fair eye contact.  No abnormal movements noticed.  The patient was able to speak, but there was a delay in the speech process. He was goal directed, but at certain times seem to be circumstantial and tangential.  He denies hearing any voices, but delusional component of grandiosity present.  Denies any active suicidal ideations, reported depressed mood and anxiety.  Cognition alert, awake, and oriented x3. Memory of immediate, recent, and remote fair to poor.  Attention and concentration fair.  Abstraction ability fair.  Insight and judgment poor to fair.  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 Axis II:  Deferred. Axis III:  See medical notes. Axis IV:  Chronic mental health issues and psychosocial stressors. Axis: V:  40 to 50  RECOMMENDATIONS: 1. The patient can be continued on Cymbalta 60 mg p.o. daily along     with Xanax 1 mg t.i.d. as needed.  I also started the patient on     Seroquel 12.5 mg 3 times a day. 2. We will get more collateral information on this patient.  We will     also call Dr. Christy Holt and Dr. Jerry Holt  Thank you for involving me in taking care of this patient.     Patrick Ditch, MD     SA/MEDQ  D:  07/09/2010  T:  07/09/2010  Job:   [id number]  Electronically Signed by Patrick Holt  on 07/15/2010 05:30:42 PM

## 2010-07-22 NOTE — Discharge Summary (Signed)
NAMEFRANKO, HILLIKER NO.:  [id number]  MEDICAL RECORD NO.:  [id number]           PATIENT TYPE:  E  LOCATION:  [facility]                         FACILITY:  [facility]  PHYSICIAN:  Leighton Roach Jefrey Raburn, M.D.DATE OF BIRTH:  Jan 15, 1951  DATE OF ADMISSION:  07/06/2010 DATE OF DISCHARGE:  07/10/2010                              DISCHARGE SUMMARY   DISCHARGE DIAGNOSES: 1. Altered mental status. 2. Possible seizure versus pseudoseizure. 3. Type 2 diabetes mellitus. 4. Hyperlipidemia. 5. Obesity. 6. Depression. 7. Anxiety. 8. Tobacco abuse. 9. Hypertension. 10.History of ischemic heart disease. 11.History of Raynaud disease. 12.Hemorrhoids. 13.Chronic obstructive pulmonary disease. 14.Esophageal motility disorder. 15.Gastroesophageal reflux disease. 16.Hiatal hernia. 17.Diverticulosis of the colon. 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 19.Rheumatoid arthritis. 20.Fibromyalgia. 21.Vertigo. 22.Generalized pain. 23.Right hand tremor. 24.Urge incontinence. 25.Irritable bowel. 26.Paroxysmal atrial fibrillation.  DISCHARGE MEDICATIONS:  New medications. 1. Seroquel 12.5 mg p.o. q.8 h. 2. Thiamine 100 mg p.o. daily.  He is to continue the following medications: 1. Alprazolam 1 mg 1 tablet p.o. t.i.d. p.r.n. for anxiety. 2. Cymbalta 60 mg 1 tablet p.o. b.i.d. 3. Folic acid 1 mg tab 1 tablet p.o. daily. 4. Glipizide 5 mg 1 tablet p.o. daily. 5. Lisinopril/hydrochlorothiazide 20/25 half tablet p.o. daily. 6. Methotrexate 2.5 mg 3 tablets p.o. weekly on Wednesday. 7. Metoprolol tartrate 50 mg 1 tablet p.o. b.i.d. 8. Niaspan 1000 mg p.o. daily. 9. Oxybutynin 10 mg p.o. daily. 10.Oxycodone 5 mg 1 tablet p.o. t.i.d. p.r.n. 11.Potassium chloride 20 mEq 2 tablets p.o. daily. 12.Prednisone 5 mg 1 tablet p.o. daily. 13.Promethazine 25 mg 1 tablet p.o. q.6 h. p.r.n. for nausea. 14.Ropinirole 2 mg 1 tablet p.o. t.i.d. 15.Torsemide 10 mg 1 tablet p.o. daily.  CONSULTATIONS:  Psychiatry  Service, Eulogio Ditch, MD.  PROCEDURES AND IMAGING: 1. On Jul 06, 2010, the patient underwent noncontrast head CT with     impression: a .  Stable atrophy white matter disease, likely reflecting chronic microvascular ischemia. b.  No acute intracranial abnormality. c.  Atherosclerosis. 1. He had a chest x-ray on Jul 06, 2010, with impression:     a.     Emphysematous and bronchitic changes, minimal bibasilar      atelectases. 2. He had an MRI of the brain on Jul 07, 2010, with impression,     atrophy and small vessel disease without apparent acute infarction     or acute intracranial hemorrhage.  There is no visible abnormal     postcontrast enhancement. 3. He had an ankle x-ray with no acute finding. 4. He had a foot x-ray with plantar calcaneal spur. 5. He underwent an EEG with impression, this is a normal awake and     sleep EEG.  There is no evidence to suggest electrographic seizures     or epileptiform discharges based on this study.  Majority of the     study is reflective of sleep segment with superimposed beta     activity which is currently seen in patient who is on sedating     medications.  There is no evidence for electrographic seizures or     epileptiform discharges.  LABORATORY DATA:  At time of admission, WBCs  of 10.9, hemoglobin 15.7, hematocrit of 44.9, platelets of 243.  Sodium 139, potassium 2.5, chloride 98, bicarb 28, glucose of 82, BUN of 9, creatinine 0.68. Alcohol level was less than 11.  Lipase is 18.  Urine drug screen was positive for benzodiazepines.  Lactic acid was 1.1 and procalcitonin was less than 0.10.  During the hospitalization significant labs were negative cardiac enzymes x3.  Ammonia level of 55.  Urine culture with no growth and blood cultures are negative x2.  At the time of discharge, sodium was 137, potassium 3.5, chloride 103, bicarb of 24, BUN of 5, creatinine 0.50, glucose of 99.  WBCs of 8.7, hemoglobin of 15.1, hematocrit  of 43.1, platelets of 253.  BRIEF HOSPITAL COURSE:  This is a 60 year old male who presented with acute mental status change and question of seizures. 1. Altered mental status.  At the time of admission, the patient was     very confused and very disoriented.  In the ED, the patient had     what looked like a tonic-clonic seizure.  It was felt that the     patient was likely postictal and that was the cause of his     confusion at that point.  It was also thought that a possible     stroke or mass lesion could be causing his symptom, so an MRI was     obtained which was found to be normal without intracranial mass or     acute stroke.  The patient also underwent an EEG to rule out     seizure activity.  This was also normal.  Given his normal neuro     imaging and normal EEG, it was felt that there may be some     underlying psychiatric disorder occurring.  Throughout the hospital     course, the patient's behavior became increasingly odd and he     expressed difficulty in performing his ADLs including using a     urinal on his own.  PT, OT was consulted at that time to assess for     difficulty with ADLs.  PT/OT recommended home health physical     therapy and occupational therapy, this was set up prior to     discharge.  Also, the Psychiatry Service was consulted and felt     that he had major depressive disorder with psychotic features.  He     was started on Seroquel 12.5 mg.  Following the addition of     Seroquel, his mental status had completely cleared.  His speech had     normalized and he was much more appropriate to questions.  It was     felt that this was an acute psychotic break secondary to financial     difficulties at home. 2. Question of seizures versus pseudoseizures.  In the ED, the patient     with witnessed tonic-clonic seizure-like activity.  At the time of     admission, this was felt to likely be secondary to benzodiazepine     or alcohol withdrawal.  The  patient did admit to running out of his     Xanax a few days prior.  Also has been taking his Xanax     incorrectly.  He has been taking 3 tablets at one time instead of 1     tablet three times a day because he says he takes his medicines     like food and he can only afford to  eat once per day.  Education     was given about how to take these medicines correctly.  During the     hospitalization, the patient did have seizure-like activity again     with a protruding tongue and catatonic-like behavior.  However, he     was able to be arouse with sternal rub or nail bed pressure, so     this is not typical of a true seizure.  It was felt that this was     possibly a pseudoseizure exacerbated by his major depression with     psychotic features. 3. Type 2 diabetes mellitus.  The patient was placed on sliding scale     insulin throughout the hospitalization.  His CBGs remained well     controlled. 4. Hyperlipidemia.  He was continued on niacin. 5. Depression/anxiety.  During the hospitalization, the patient was     continued on his Cymbalta.  Initially, his Xanax was held and he     was placed on CIWA protocol.  Initially was transitioned to Librium     from CIWA protocol and initially placed back on his home Xanax regimen prior to discharge.  Seroquel was added for depression with     psychotic features. 6. Hypertension.  Blood pressures remained fairly well controlled     during the hospitalization.  He was continued on his home     antihypertensives throughout the hospitalization. 7. Rheumatological disorders.  The patient's methotrexate was held     during the hospitalization due to possibly contributing to mental     status change.  He was continued on his prednisone during the     hospitalization.  He may resume his methotrexate at the time of     discharge. 8. Urge incontinence.  The patient had episodes of difficulty using     his urinal throughout the hospitalization.  This was  felt that he     had secondary gain from this because he was only requesting male     nurses to help him use a urinal.  During the hospitalization, his     Ditropan was held due to possible mental status change effects.  At     the time of discharge, this may be restarted. 9. COPD.  The patient was continued on his home Combivent.  DISCHARGE INSTRUCTIONS:  The patient was discharged with instructions to increase activity slowly and walk with assistance.  He is to have no restrictions in his diet.  FOLLOWUP APPOINTMENTS:  He is to follow up with Dr. Rodney Langton, with [facility] on Jul 14, 2010.  He is to keep his current appointment that he has scheduled.  DISCHARGE CONDITION:  The patient will be discharged to home in stable medical condition.    ______________________________ Everrett Coombe, MD   ______________________________ Leighton Roach Sian Rockers, M.D.    CM/MEDQ  D:  07/15/2010  T:  07/16/2010  Job:  [id number]  Electronically Signed by Everrett Coombe MD on 07/16/2010 11:06:54 PM Electronically Signed by Acquanetta Belling M.D. on 07/22/2010 01:21:32 PM

## 2010-07-27 ENCOUNTER — Telehealth: Payer: Self-pay | Admitting: Sports Medicine

## 2010-07-27 NOTE — Telephone Encounter (Signed)
Patrick Holt was missing the prescription for Oxycodone, he thought he dropped it off at the drug store, but isn't sure if he lost it but would like a new one.

## 2010-07-28 ENCOUNTER — Telehealth: Payer: Self-pay | Admitting: Family Medicine

## 2010-07-28 ENCOUNTER — Telehealth: Payer: Self-pay | Admitting: Sports Medicine

## 2010-07-28 ENCOUNTER — Encounter: Payer: Self-pay | Admitting: Sports Medicine

## 2010-07-28 ENCOUNTER — Other Ambulatory Visit: Payer: Self-pay | Admitting: Sports Medicine

## 2010-07-28 MED ORDER — OXYCODONE HCL 5 MG PO TABS: 5.0000 mg | ORAL_TABLET | Freq: Three times a day (TID) | ORAL | Status: DC | PRN

## 2010-07-28 NOTE — Telephone Encounter (Signed)
Mr. Reicher called the after hours line for his chronic pain medications, he is on Oxycodone per the last visit in May. He is given q 2 month supply , he is now out of pain meds, he is unsure if he was given another hard copy but the pharmacy does not have any new prescriptions for him. He went on to discuss his chronic medical problems listed in the chart and why he needs his meds. He is concerned because it has has been 2 days since he initially called and a physician has not responded to his message.  I can not prescribe this medication during the after ours line, and he understood that His med list does not have the oxycodone but the visit from a few weeks ago states he is on this medication and it is a chronic med. If Dr. Karie Schwalbe is not available please ask a preceptor to review this and give him enough meds to at least get him to his next appt this month. I did not see any red flags on his last visit.

## 2010-07-28 NOTE — Telephone Encounter (Signed)
Calling again today about his pain meds- he is in pain

## 2010-07-28 NOTE — Telephone Encounter (Signed)
err

## 2010-07-28 NOTE — Telephone Encounter (Signed)
A 2 month supply, 180 tabs, was written on 5/29.  Looking back at the logbook, he signed for it and picked it up. We do not replace lost/stolen scripts for narcotics. His next refill is 09/12/2010. 

## 2010-07-28 NOTE — Telephone Encounter (Signed)
A 2 month supply, 180 tabs, was written on 5/29.  Looking back at the logbook, he signed for it and picked it up. We do not replace lost/stolen scripts for narcotics. His next refill is 09/12/2010.

## 2010-07-29 ENCOUNTER — Telehealth: Payer: Self-pay | Admitting: *Deleted

## 2010-07-29 NOTE — Telephone Encounter (Signed)
Spoke with patient and informed him that we will not be able to write/replace narcotic prescriptions. Patient states that pharmacy has lost prescriptions before and this is common for them. Explained to Patrick Holt that we cannot replace this three times before patient informed me that he will put himself back in the hospital so he can receive pain meds.

## 2010-07-29 NOTE — Telephone Encounter (Signed)
Note to all, if Mr Rise obtains narcotics from the ED or other provider, he will be dismissed from the practice per his pain contract on 05/31/2008.

## 2010-07-29 NOTE — Telephone Encounter (Signed)
Received call from pharmacist at [facility] stating patient called them and wanted her to call us. He misplaced the oxycodone 5 mg  RX  given on 07/10/2010.  Pharmacist states he last got refill on this RX April 2 and received #180 capsules. Will forward to MD.

## 2010-07-29 NOTE — Telephone Encounter (Signed)
Noted. See prior phone notes.

## 2010-07-29 NOTE — Telephone Encounter (Signed)
Pharmacy notified of message from Dr. Benjamin Stain that we do not replace stolen or lost  Narcotic RX.

## 2010-07-30 ENCOUNTER — Telehealth: Payer: Self-pay | Admitting: Sports Medicine

## 2010-07-30 NOTE — Telephone Encounter (Signed)
Patrick Holt called to say he spoke with a rep from [facility] and the informed him their interpretation was that the rx could not be rewritten if the medicine had been filled by the pharmacy.  They did not have a record of it being processed through them.  Patrick Holt is requesting a new rx since he never filled the first one but lost it.  Said he couldn't wait until 7/25.

## 2010-07-30 NOTE — Telephone Encounter (Signed)
I don't know how many times we have to repeat this, we do not replace lost narcotic scripts.  If he keeps abusing the office staff with repeated calls we will dismiss him from the practice as per his signed narcotic agreement.  Pls see the multiple prior phone notes.

## 2010-07-30 NOTE — Telephone Encounter (Signed)
Informed pt of what Dr. Benjamin Stain said and that if he continues to call and harass our staff about this he will be dismissed from the practice per his narcotic agreement. Pt was not happy about this.Patrick Holt

## 2010-08-03 NOTE — Telephone Encounter (Signed)
Please refer him to his signed narcotic agreement in centricity from 05/31/2008. If he again calls re: this same issue we will stop rx'ing narcotics altogether.

## 2010-08-03 NOTE — Telephone Encounter (Signed)
Patrick Fus  Do you think we should stop prescribing narcotics for him (instead of dismissing)?   You might also want to give a verbal summary to his new PCP  thanks

## 2010-08-03 NOTE — Telephone Encounter (Addendum)
Noted.  Discussed this in person with his new PCP.  Narcotics are reasonable as he has chronic pain.  This is the 2nd time he has lost a script and requested a replacement.  He likely just needed to be reminded of our practice policy and his signed agreement re: lost/stolen scripts.

## 2010-08-05 ENCOUNTER — Ambulatory Visit (INDEPENDENT_AMBULATORY_CARE_PROVIDER_SITE_OTHER): Payer: Medicare HMO | Admitting: Sports Medicine

## 2010-08-05 ENCOUNTER — Encounter: Payer: Self-pay | Admitting: Sports Medicine

## 2010-08-05 DIAGNOSIS — K589 Irritable bowel syndrome without diarrhea: Secondary | ICD-10-CM

## 2010-08-05 DIAGNOSIS — R52 Pain, unspecified: Secondary | ICD-10-CM

## 2010-08-05 MED ORDER — PREDNISONE 50 MG PO TABS: 50.0000 mg | ORAL_TABLET | Freq: Every day | ORAL | Status: DC

## 2010-08-05 MED ORDER — ACETAMINOPHEN ER 650 MG PO TBCR: 1300.0000 mg | EXTENDED_RELEASE_TABLET | Freq: Three times a day (TID) | ORAL | Status: AC | PRN

## 2010-08-05 MED ORDER — CYCLOBENZAPRINE HCL 5 MG PO TABS: 5.0000 mg | ORAL_TABLET | Freq: Three times a day (TID) | ORAL | Status: AC | PRN

## 2010-08-05 MED ORDER — DIPHENOXYLATE-ATROPINE 2.5-0.025 MG PO TABS: 1.0000 | ORAL_TABLET | Freq: Four times a day (QID) | ORAL | Status: AC | PRN

## 2010-08-05 MED ORDER — GABAPENTIN 600 MG PO TABS: 600.0000 mg | ORAL_TABLET | Freq: Three times a day (TID) | ORAL | Status: DC

## 2010-08-05 NOTE — Assessment & Plan Note (Addendum)
Again, no change in the characteristics of his back pain. Too early to refill lost oxycodone script. Will rx 1 month of: Acetaminophen 1300 TID Neurontin 600 TID Flexeril 5 TID Prednisone 50 qd for 5d. These are the most efficacious/evidence based for LBP. RTC prn.

## 2010-08-05 NOTE — Progress Notes (Signed)
  Subjective:    Patient ID: Patrick Holt, male    DOB: February 14, 1951, 60 y.o.   MRN: [id number]  HPI Pt with chronic LBP, no change.  No bowel/bladder problems.  No change in strength (wheelchair bound 2/2 dermatomyositis).  No trauma.  Hx bulging discs on MRI per pt.  Would like recommendations for further treatment as he lost his oxycodone script and cannot get a refill until later in July.  No fevers/chills.   Review of Systems    See HPI Objective:   Physical Exam  Constitutional: He appears well-developed and well-nourished. No distress.  Skin: Skin is warm and dry.          Assessment & Plan:

## 2010-08-05 NOTE — Assessment & Plan Note (Signed)
Symptoms much improved with Lomotol.  Will refill.

## 2010-08-05 NOTE — Patient Instructions (Signed)
Acetaminophen, gabapentin, flexeril, prednisone for your back pain. Lomotil for diarrhea. Come back to see [place] in a month.  Ihor Austin. Benjamin Stain, M.D. [facility] [address], [place] [address] [phone]

## 2010-09-02 ENCOUNTER — Encounter: Payer: Self-pay | Admitting: Family Medicine

## 2010-09-02 ENCOUNTER — Ambulatory Visit (INDEPENDENT_AMBULATORY_CARE_PROVIDER_SITE_OTHER): Payer: Medicare HMO | Admitting: Family Medicine

## 2010-09-02 VITALS — BP 126/74 | HR 67 | Temp 99.2°F

## 2010-09-02 DIAGNOSIS — M3390 Dermatopolymyositis, unspecified, organ involvement unspecified: Secondary | ICD-10-CM

## 2010-09-02 DIAGNOSIS — K648 Other hemorrhoids: Secondary | ICD-10-CM

## 2010-09-02 DIAGNOSIS — R52 Pain, unspecified: Secondary | ICD-10-CM

## 2010-09-02 DIAGNOSIS — E119 Type 2 diabetes mellitus without complications: Secondary | ICD-10-CM

## 2010-09-02 DIAGNOSIS — R42 Dizziness and giddiness: Secondary | ICD-10-CM

## 2010-09-02 LAB — CK: Total CK: 330 U/L — ABNORMAL HIGH (ref 7–232)

## 2010-09-02 LAB — POCT GLYCOSYLATED HEMOGLOBIN (HGB A1C): Hemoglobin A1C: 5.4

## 2010-09-02 MED ORDER — OXYCODONE HCL 5 MG PO TABS: 5.0000 mg | ORAL_TABLET | Freq: Three times a day (TID) | ORAL | Status: DC | PRN

## 2010-09-03 ENCOUNTER — Encounter: Payer: Self-pay | Admitting: Family Medicine

## 2010-09-03 ENCOUNTER — Telehealth: Payer: Self-pay | Admitting: Family Medicine

## 2010-09-03 ENCOUNTER — Encounter: Payer: Self-pay | Admitting: *Deleted

## 2010-09-03 NOTE — Assessment & Plan Note (Signed)
Likely cause of bleeding.  No red flags on exam.  Blood pressure good today.   Strict warnings and red flags given.

## 2010-09-03 NOTE — Assessment & Plan Note (Addendum)
Will add Meclizine therapy to attempt relief.   Seems to be peripheral versus central lesion.   Cont to follow.

## 2010-09-03 NOTE — Assessment & Plan Note (Signed)
Wrote for 2 months prescription, to be filled 7/25.

## 2010-09-03 NOTE — Progress Notes (Unsigned)
  Subjective:    Patient ID: Patrick Holt, male    DOB: 09-10-1950, 60 y.o.   MRN: [id number]  HPI    Review of Systems     Objective:   Physical Exam        Assessment & Plan:  Patient came in for office visit on 09/02/2010 and was seen by Dr. Gwendolyn Grant. The patient was to go to lab and being that there was no space for him I explained to him he was going to have to wait and he said "that's ok as long as I can be looking at you" As the patient left he took my hand and asked me "do you have a boyfriend" I responded yes and then tried to pull my hand back. 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

## 2010-09-03 NOTE — Progress Notes (Signed)
I will discuss this with him at his next visit.

## 2010-09-03 NOTE — Progress Notes (Signed)
  Subjective:    Patient ID: Patrick Holt, male    DOB: 12-27-1950, 60 y.o.   MRN: [id number]  HPI Patrick Holt is here for a visit to meet his new PCP.  We discussed multiple medical problems as well as his past medical history extensively.  He reports that he is "one of 30 people nationwide who cannot quit smoking for medical reasons," stating the tar has a protective effect on his lungs.  We discussed his multiple medical allergies, and he informed me that he is also allergic to sunlight.  When pressed on this issue, he describes skin fissuring and cracking, no blisters, and some itching. (I believe this is likely secondary to his dermatomyositis rather than an undiagnosed disease such as porphyria cutanea tarda, but I am not sure.)    Current complaints:  1.  Chronic Pain:  Please see extensive Telephone notes regarding chronic pain as well patient losing Oxycodone.  Long-term sufferer of chronic pain from multiple accidents, falls, dermatomyositis.  Pain is 8-10/10 chronically.  No acute worsening for several months to years.  2.  Rectal bleeding:  Hx/o internal hemorrhoids as well as diverticulosis.  States he has bleeding which occurs as either blood when wipes versus drops of blood in toilet.  No lightheadedness or acute worsening.  When asked about colonscopy, he states it is time for him to have another.  On record review, he had one in 2010, 1 sessile polyp removed, needs fu in 5 years.  No lightheadedness, chest pain, shortness of breath, palpitations.  3.  Vertigo:  Long-time problem for patient, occurs for years.  Has not had anything for this.  Describes typical vertiginous symptoms, sometimes falls out of wheelchair.  No prodrome or consistent preceding triggers.  No tinnitus, changes in hearing, vision, neurological deficits.    Multiple other medical concerns were raised, but we did not have time to go through these as we had to review his extensive PMH and medication/allergy  list.   Review of Systems See HPI above for review of systems.       Objective:   Physical Exam Gen:  Alert, cooperative patient who appears stated age in no acute distress.  Vital signs reviewed.  Sitting in mobile wheelchair Cardiac:  Regular rate and rhythm without murmur auscultated.  Good S1/S2. Pulm:  Clear to auscultation bilaterally with good air movement.  No wheezes or rales noted.   MSK:  4/5 strength BL LE's.  4/5 BL UE's.   Neuro:  No decreased sensation BL LEs.  CN II-XII intact.   Ext:  No clubbing/cyanosis/erythema.  No edema noted bilateral lower extremities.   Skin:  Multiple fissures of various healing ages scattered throughout extremities, heliotrope rash noted.            Assessment & Plan:

## 2010-09-03 NOTE — Telephone Encounter (Signed)
Spoke with [employer] about in home care and they require pre-auth before this set.

## 2010-09-07 NOTE — Telephone Encounter (Signed)
Spoke to [place] about this.  She is going to call [facility] and see how they work with [facility] patients.  Await their response.

## 2010-09-07 NOTE — Telephone Encounter (Signed)
Pt called again is wanting to know status.

## 2010-09-08 ENCOUNTER — Other Ambulatory Visit: Payer: Self-pay | Admitting: Family Medicine

## 2010-09-08 ENCOUNTER — Telehealth: Payer: Self-pay | Admitting: Family Medicine

## 2010-09-08 DIAGNOSIS — M069 Rheumatoid arthritis, unspecified: Secondary | ICD-10-CM

## 2010-09-08 NOTE — Telephone Encounter (Signed)
Mr. Bolds is angry and frustrated because he called [facility] and discovered that they had to deny his referral due to staffing issues.  He wants to know what is being done.  He says, "It has been way way over 48 hours and he can call Arline Asp at [employer], and he didn't have this problem with Dr. Karie Schwalbe.  He would appreciate a call even if it is from the nurse."
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
## 2010-09-08 NOTE — Telephone Encounter (Signed)
Spoke with [facility] payor services. They do require a pre-auth. When they receive referral the will acquire the pre-cert. I will fax over referral today to them.Patrick Holt

## 2010-09-08 NOTE — Telephone Encounter (Signed)
Referral was faxed today. 

## 2010-09-08 NOTE — Telephone Encounter (Signed)
Called last week about the [facility], but no one called him back.  He said it has been long over 48 hours and no one has called him back.

## 2010-09-10 ENCOUNTER — Other Ambulatory Visit: Payer: Self-pay | Admitting: Family Medicine

## 2010-09-10 MED ORDER — DIPHENOXYLATE-ATROPINE 2.5-0.025 MG PO TABS: 1.0000 | ORAL_TABLET | Freq: Four times a day (QID) | ORAL | Status: DC | PRN

## 2010-09-12 ENCOUNTER — Encounter: Payer: Self-pay | Admitting: Family Medicine

## 2010-09-16 NOTE — Telephone Encounter (Signed)
Spoke with [facility] she stated that they are not taking [employer] at this time due to a contract issue

## 2010-09-16 NOTE — Telephone Encounter (Signed)
Pt is calling again to ask about [facility] referral - he hasn't heard anything and wants to know who will be coming out to see him.

## 2010-09-16 NOTE — Telephone Encounter (Signed)
Spoke with patient and explained to him about below and he agreed to calling his insurance company to find out where he can get home health from

## 2010-09-25 ENCOUNTER — Ambulatory Visit (INDEPENDENT_AMBULATORY_CARE_PROVIDER_SITE_OTHER): Payer: Medicare HMO | Admitting: Family Medicine

## 2010-09-25 DIAGNOSIS — R1031 Right lower quadrant pain: Secondary | ICD-10-CM

## 2010-09-25 NOTE — Patient Instructions (Signed)
I will send in a referral to Dr. Valentino Hue to see if we can get you in there. I will also send a referral to Dr. Juanda Chance regarding your abdominal pain.  If you start having worsening pain, nausea and vomiting, or fever, call us or go to the ED.   I still recommend getting set up with the [facility]iatric[facility] clinic so we can evaluate you for falls.   It was good to see you today.

## 2010-09-27 DIAGNOSIS — R1031 Right lower quadrant pain: Secondary | ICD-10-CM | POA: Insufficient documentation

## 2010-09-27 NOTE — Assessment & Plan Note (Signed)
No peritoneal signs.  No red flags by history or physical. Discussed extensively with him and provided written instructions as to what should prompt him to call clinic or go to ED.   Expressed understanding.

## 2010-09-27 NOTE — Progress Notes (Signed)
  Subjective:    Patient ID: Patrick Holt, male    DOB: 02-Mar-1950, 60 y.o.   MRN: [id number]  HPI 1.  Abdominal Pain: Present for several weeks.  States he has called clinic to discuss this pain several times and was told I would call him back but that he never heard from me.  When records were reviewed, there was never any mention of abdominal pain, only discussion about home health.  He states that the receptionists and nurse must "not have written it down."  Describes RLQ pain.  Feels like "pulled muscle."  Present at times when he pushes on his own belly, but only inconsistently.  Eating well.  No dysuria or penile discharge.  States pain is fairly constant.  Sleeping poorly at night but this is long-term problem and not changed or affected by pain.  No change in appetite (decreased food intake due to finances, not pain or pathology), nausea, vomiting, change in bowel habits, fevers, chills.   Review of Systems See HPI above for review of systems.       Objective:   Physical Exam Gen:  Alert, cooperative patient who appears stated age in no acute distress.  Vital signs reviewed. Abd:  Soft/nondistended.  Tender RLQ about 2 cm from umbilicus.  Good bowel sounds throughout all four quadrants.  No masses noted.  No guarding, rebound tenderness. Gait:  Using walker rather then wheelchair to ambulate today.        Assessment & Plan:  Other issues:  Question of inappropriateness with clinic staff at prior visit.  Well documented in chart.  Reviewed with patient this behavior will NOT be tolerated and he will be dismissed if this occurs in future.  Patient expresses understanding, but states comments were taken out of context.  Re-iterated professional interactions with clinic staff.

## 2010-10-14 ENCOUNTER — Ambulatory Visit: Payer: Medicare HMO | Admitting: Family Medicine

## 2010-10-21 ENCOUNTER — Telehealth: Payer: Self-pay | Admitting: Family Medicine

## 2010-10-21 MED ORDER — TORSEMIDE 10 MG PO TABS: 10.0000 mg | ORAL_TABLET | Freq: Every day | ORAL | Status: DC

## 2010-10-21 NOTE — Telephone Encounter (Signed)
I will refill this today.  Marland Kitchen

## 2010-10-21 NOTE — Telephone Encounter (Signed)
PLEASE, PLEASE REFILL THIS RX FOR MR. Adames!!!

## 2010-11-03 ENCOUNTER — Encounter: Payer: Self-pay | Admitting: Family Medicine

## 2010-11-03 ENCOUNTER — Ambulatory Visit (INDEPENDENT_AMBULATORY_CARE_PROVIDER_SITE_OTHER): Payer: Medicare HMO | Admitting: Family Medicine

## 2010-11-03 VITALS — BP 138/80 | HR 71 | Temp 97.6°F | Ht 68.0 in | Wt 208.0 lb

## 2010-11-03 DIAGNOSIS — W19XXXA Unspecified fall, initial encounter: Secondary | ICD-10-CM

## 2010-11-03 DIAGNOSIS — R1031 Right lower quadrant pain: Secondary | ICD-10-CM

## 2010-11-03 DIAGNOSIS — R42 Dizziness and giddiness: Secondary | ICD-10-CM

## 2010-11-03 DIAGNOSIS — M545 Low back pain, unspecified: Secondary | ICD-10-CM

## 2010-11-03 DIAGNOSIS — K0889 Other specified disorders of teeth and supporting structures: Secondary | ICD-10-CM

## 2010-11-03 DIAGNOSIS — E119 Type 2 diabetes mellitus without complications: Secondary | ICD-10-CM

## 2010-11-03 DIAGNOSIS — K089 Disorder of teeth and supporting structures, unspecified: Secondary | ICD-10-CM

## 2010-11-03 DIAGNOSIS — Z23 Encounter for immunization: Secondary | ICD-10-CM

## 2010-11-03 MED ORDER — OXYCODONE HCL 5 MG PO TABS: 5.0000 mg | ORAL_TABLET | Freq: Three times a day (TID) | ORAL | Status: DC | PRN

## 2010-11-03 NOTE — Assessment & Plan Note (Signed)
Resolved
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
## 2010-11-03 NOTE — Assessment & Plan Note (Signed)
A1c is 5.4. States he still takes his glipizide. Discuss the peripheral neuropathy is difficult to control. Discuss any new medication he states he will for now. Recommend starting gabapentin or Lyrica for him in the future to help with this neuropathy.

## 2010-11-03 NOTE — Assessment & Plan Note (Addendum)
We'll need to obtain pelvis and hip x-rays. I do not think x-ray has a fracture as he has been ambulating for the past 2 weeks. However we'll need to rule this out. It also might help and qualify for possible SNF placement if he is in need of surgery.  Noted scars on his head from recent falls. These seem very well healed and I believe they are from falls from several months rather than several weeks ago. No new neurological symptoms red flags, no headaches, no concern for any need for a CT of his head.

## 2010-11-04 ENCOUNTER — Telehealth: Payer: Self-pay | Admitting: *Deleted

## 2010-11-04 NOTE — Telephone Encounter (Signed)
Thanks so much.  This is good to know.

## 2010-11-04 NOTE — Telephone Encounter (Signed)
Patrick Holt, I spoke with Patrick Holt at Dr. Luretha Murphy office and she informed that patient is not a candidate for the clinic.Patrick Holt

## 2010-11-04 NOTE — Telephone Encounter (Signed)
Message copied by Farrell Ours on Wed Nov 04, 2010  9:02 AM ------      Message from: Midway Cellar      Created: Tue Nov 03, 2010  5:44 PM      Regarding: [facility] referral        Patrick Holt would like to be referred back to Dr. Kristin Bruins, he is a known patient there.

## 2010-11-09 ENCOUNTER — Other Ambulatory Visit: Payer: Self-pay | Admitting: Family Medicine

## 2010-11-09 ENCOUNTER — Ambulatory Visit (HOSPITAL_COMMUNITY)
Admission: RE | Admit: 2010-11-09 | Discharge: 2010-11-09 | Disposition: A | Discharge status: Home / Self Care | Payer: Medicare HMO | Source: Ambulatory Visit | Attending: Family Medicine | Admitting: Family Medicine

## 2010-11-09 DIAGNOSIS — W19XXXA Unspecified fall, initial encounter: Secondary | ICD-10-CM

## 2010-11-09 DIAGNOSIS — M25559 Pain in unspecified hip: Secondary | ICD-10-CM | POA: Insufficient documentation

## 2010-11-11 ENCOUNTER — Telehealth: Payer: Self-pay | Admitting: Family Medicine

## 2010-11-11 NOTE — Telephone Encounter (Signed)
Need to call for xray results.  Leave msg if pt doesn't answer

## 2010-11-17 DIAGNOSIS — M545 Low back pain, unspecified: Secondary | ICD-10-CM | POA: Insufficient documentation

## 2010-11-17 NOTE — Assessment & Plan Note (Signed)
From fall.   X-rays negative.

## 2010-11-17 NOTE — Telephone Encounter (Signed)
Called and discussed results with patient, no fracture.  Patient still with significant soft tissue pain.  Recommended he come back and be seen since he is in pain.  Patient agreed.

## 2010-11-20 ENCOUNTER — Encounter: Payer: Self-pay | Admitting: Internal Medicine

## 2010-11-20 ENCOUNTER — Ambulatory Visit (INDEPENDENT_AMBULATORY_CARE_PROVIDER_SITE_OTHER): Payer: Medicare HMO | Admitting: Internal Medicine

## 2010-11-20 VITALS — BP 128/90 | HR 76 | Ht 68.0 in | Wt 189.0 lb

## 2010-11-20 DIAGNOSIS — K625 Hemorrhage of anus and rectum: Secondary | ICD-10-CM

## 2010-11-20 MED ORDER — HYDROCORTISONE ACE-PRAMOXINE 2.5-1 % RE CREA: TOPICAL_CREAM | Freq: Three times a day (TID) | RECTAL | Status: AC | PRN

## 2010-11-20 MED ORDER — ESOMEPRAZOLE MAGNESIUM 40 MG PO CPDR: 40.0000 mg | DELAYED_RELEASE_CAPSULE | Freq: Every day | ORAL | Status: DC

## 2010-11-20 MED ORDER — POLYETHYLENE GLYCOL 3350 17 GM/SCOOP PO POWD: ORAL | Status: DC

## 2010-11-20 NOTE — Patient Instructions (Signed)
We have sent the following medications to your pharmacy for you to pick up at your convenience: Analpram cream Nexium 40 mg daily. Miralax 1 scoop daily as needed for constipation. CC: Dr Renold Don

## 2010-11-20 NOTE — Progress Notes (Signed)
Patrick Holt 1950-05-08 MRN [id number]   History of Present Illness:  This is a 60 year old white male who is chronically ill. He arrives in a wheelchair. He has a history of dermatomyositis. I have known him for the past 12 years. He has gastrointestinal problems consist of esophageal stricture for which he is status post multiple dilations. 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 He has a history of a tubular adenoma on his last colonoscopy in Jan 2010. He does not have any specific complaints today other than alternating diarrhea and constipation. He takes Lomotil for diarrhea. He also has noticed some rectal bleeding. His last colonoscopy was in January 2010. He will be due for his next colonoscopy in January 2016.   Past Medical History  Diagnosis Date  . Rheumatoid arthritis   . Obesity   . Major depression     with acute psychotic break in 06/2010  . Diabetes mellitus   . Hypertension   . Hyperlipidemia   . Diverticulosis of colon   . COPD (chronic obstructive pulmonary disease)   . Anxiety   . GERD (gastroesophageal reflux disease)   . Vertigo   . Seizures   . Fibromyalgia   . Dermatomyositis   . Myocardial infarct     mulitple (1999, 2000, 2004)  . Raynaud's disease   . Narcotic dependence   . Peripheral neuropathy   . Internal hemorrhoids   . Ischemic heart disease   . Hiatal hernia   . Gastritis   . Diverticulitis   . Hx of adenomatous colonic polyps   . Nephrolithiasis   . Anemia   . Esophageal stricture   . Esophageal dysmotility   . Dermatomyositis   . Paroxysmal a-fib   . Urge incontinence    Past Surgical History  Procedure Date  . Knee arthroscopy w/ meniscal repair 2009    left  . Lumbar disc surgery     reports that he has been smoking.  He does not have any smokeless tobacco history on file. He reports that he drinks about .6 ounces of alcohol per week. He reports that he does not use illicit  drugs. family history includes Alcohol abuse in his mother; Depression in his mother; Diabetes in his mother and other; Heart disease in his mother; and Stroke in his mother. Allergies  Allergen Reactions  . Advair Hfa   . Betamethasone Dipropionate   . Bupropion Hcl   . Ciprofloxacin     REACTION: swelling  . Clobetasol   . Codeine   . Escitalopram Oxalate   . Fluoxetine Hcl   . Furosemide   . Metronidazole     REACTION: swelling  . Paroxetine   . Penicillins   . Statins   . Tacrolimus   . Tetanus Toxoid   . Tuberculin Purified Protein Derivative         Review of Systems: Denies dysphagia odynophagia. Denies abdominal pain. Positive for small-volume hematochezia  The remainder of the 10 point ROS is negative except as outlined in H&P   Physical Exam: General appearance  Well developed, in no distress. Alert and oriented in a wheelchair Eyes- non icteric. HEENT nontraumatic, normocephalic. Mouth no lesions, tongue papillated, no cheilosis. Neck supple without adenopathy, thyroid not enlarged, no carotid bruits, no JVD. Lungs Clear to auscultation bilaterally. Cor normal S1, normal S2, regular rhythm, no murmur,  quiet precordium. Abdomen: Soft nontender with normoactive bowel sounds. No fullness. Rectal: Normal rectal sphincter tone, stool is Hemoccult negative.  Extremities no pedal edema. Skin no lesions. Neurological alert and oriented x 3. Psychological normal mood and affect, Patient is unable to focus on one subject. He has tangential thinking and short term memory problem.  Assessment and Plan:  Problem #1 dermatomyositis. His disease carries an increased risk for colorectal cancer. He is up-to-date on his colonoscopy. His next colonoscopy will be due in January 2016. His rectal bleeding is likely from constipation and straining. We will start him on MiraLax 17 g when necessary.  Problem #2 gastroesophageal reflux. Patient's last endoscopy was in January 2010.  He does not have any symptoms currently that would warrant the repeat upper endoscopy with dilatation. He will continue Nexium 40 mg daily.  Problem #3 colon polyps. Her next colonoscopy will be due in January 2016.  11/20/2010 Lina Sar

## 2010-11-30 ENCOUNTER — Telehealth: Payer: Self-pay | Admitting: Family Medicine

## 2010-11-30 NOTE — Telephone Encounter (Signed)
Monica at [employer] is calling about a referral.  She is faxing over a form.

## 2010-12-01 NOTE — Telephone Encounter (Signed)
Forwarded to pcp.Patrick Holt  

## 2010-12-01 NOTE — Telephone Encounter (Signed)
LVM for [facility] to call back. Gave her my direct number back to the nurses station. Wanting to find out what a "referral authorization number" is and why insurance company needs. Tax ID and NPI are on referral so what is this other number needed.Patrick Holt

## 2010-12-01 NOTE — Telephone Encounter (Signed)
Found this in my box today.  I checked my box yesterday and there was no referral.  The referral sheet asks for a Referral Number rather than a prescription number, I am unsure what this is.  I have given it to Huntley Dec and she is calling to find out more information.  Will fax back today after we find out what other information they need from [place].

## 2010-12-01 NOTE — Telephone Encounter (Signed)
Mr. Wilner is now calling to check on the status of this referral.  He gave the fax # which is [phone].  He does not understand what is taking so long and why it takes so long to get Rx's filled either.  He is considering calling Dr. Mauricio Po or [employer] and filing a complaint.

## 2010-12-02 NOTE — Telephone Encounter (Signed)
Called and informed pt that we received the information from the [employer] store and told him that we have submitted the information that they have requested back to them.Patrick Holt [place]

## 2010-12-02 NOTE — Telephone Encounter (Signed)
Huntley Dec lvm this morning for [employer] from the [employer] store to return her call at [phone] to gather more information regarding Mr. Eliot Ford

## 2010-12-02 NOTE — Telephone Encounter (Signed)
Spoke with Maya B. @ [employer] and she said that [place] with the scooter store was to call them about this authorization is to go through them not [place]. There is a number that [employer] will have that we do not, so they will contact them at [phone]. Reference number given to me about the conversation we had----[id number].Erma Pinto

## 2010-12-04 NOTE — Telephone Encounter (Signed)
Attempted to call [facility] numerous times to inform of below. Got no response back. I left my direct number and number on voicemail of Monica and received no response.Patrick Holt

## 2010-12-10 ENCOUNTER — Telehealth: Payer: Self-pay | Admitting: *Deleted

## 2010-12-10 NOTE — Telephone Encounter (Signed)
Spoke with [place] and she stated that she is going to submit the request to [facility]. Being that I have spoke with the insurance company they already have this on file. She apologized for this inconvenience.

## 2010-12-15 ENCOUNTER — Telehealth: Payer: Self-pay | Admitting: Family Medicine

## 2010-12-15 NOTE — Telephone Encounter (Signed)
PLEASE CALL THIS PATIENT ASAP

## 2010-12-15 NOTE — Telephone Encounter (Signed)
Patrick Holt is calling after calling several times yesterday.  He wanted to get a message to Dr. Gwendolyn Grant.  He does not want it to go through his nurse, he says his nurse makes racist and degrading remarks to him.  He says he also heard her tell Dr. Gwendolyn Grant through the door that Dr. Gwendolyn Grant was not to make referrals for him and that she wouldn't do these referrals for this patient anymore.  [phone] is the # for [facility], needs to ask for Authorization Deparement and the reference # is [id number].  He is also frustrated because it takes so long to get his Rx's refilled

## 2010-12-15 NOTE — Telephone Encounter (Signed)
Will discuss with Dr. Gwendolyn Grant and the white team clinic staff.

## 2010-12-15 NOTE — Telephone Encounter (Signed)
Not in clinic this afternoon.  I will give him a call tomorrow AM.

## 2010-12-16 NOTE — Telephone Encounter (Signed)
Returned phone call from yesterday.  Had to leave message on answering machine.  Anticipate he will call back sometime this AM, will speak with him then.

## 2011-01-25 ENCOUNTER — Telehealth: Payer: Self-pay | Admitting: Family Medicine

## 2011-01-25 NOTE — Telephone Encounter (Signed)
Calling about diabetic shoes and supplies.  Need to have form sent to [place] completed and sent back

## 2011-01-26 ENCOUNTER — Telehealth: Payer: Self-pay | Admitting: *Deleted

## 2011-01-26 NOTE — Telephone Encounter (Signed)
Patient coming in 12/12 @ 4:15. Cancelled 12/19

## 2011-01-26 NOTE — Telephone Encounter (Signed)
Returned call to patient.  Has concerns about [employer] form not being completed and faxed back in order to get his diabetic supplies and shoes.  Patient informed that he has an appt on 02/03/2011 to be evaluated by Dr. Gwendolyn Grant and form will be completed at that time.  States he is unable to wait until next week to have form completed.  Agreed to have patient worked in Dr. Tyson Alias clinic tomorrow afternoon at 4:15pm.  Informed that he needs to arrive and register no later than 4:00pm.  Patient verbalized understanding and states he is willing to give Dr. Gwendolyn Grant "another chance"  before requesting  change to another PCP.   Gaylene Brooks, RN

## 2011-01-27 ENCOUNTER — Ambulatory Visit (INDEPENDENT_AMBULATORY_CARE_PROVIDER_SITE_OTHER): Payer: Medicare HMO | Admitting: Family Medicine

## 2011-01-27 VITALS — BP 132/85 | HR 94 | Temp 98.2°F

## 2011-01-27 DIAGNOSIS — Z8669 Personal history of other diseases of the nervous system and sense organs: Secondary | ICD-10-CM

## 2011-01-27 DIAGNOSIS — E119 Type 2 diabetes mellitus without complications: Secondary | ICD-10-CM

## 2011-01-27 DIAGNOSIS — M545 Low back pain: Secondary | ICD-10-CM

## 2011-01-27 MED ORDER — OXYCODONE HCL 5 MG PO TABS: 5.0000 mg | ORAL_TABLET | Freq: Three times a day (TID) | ORAL | Status: DC | PRN

## 2011-01-27 NOTE — Telephone Encounter (Signed)
I will discuss this with him in clinic today.

## 2011-01-27 NOTE — Patient Instructions (Signed)
Stop taking the Glipizide. We will recheck your A1C in 2 months.  Come back at that time.   If you have those "spells" call 911.  You need to be seen in the ER if that happens.

## 2011-01-28 ENCOUNTER — Telehealth: Payer: Self-pay | Admitting: Family Medicine

## 2011-01-28 NOTE — Telephone Encounter (Signed)
Asking to speak with RN, received our fax yesterday re: pts diabetes orders, says patient told her he is testing his sugar at home once daily and just finished his glipizide & starting insulin which is not what is on his order form, also says the pt told her he is currently taking prednisone however Victorino Dike said this may cause his sugar to elevate, needs to verify.

## 2011-01-29 ENCOUNTER — Telehealth: Payer: Self-pay | Admitting: Family Medicine

## 2011-01-29 DIAGNOSIS — Z8669 Personal history of other diseases of the nervous system and sense organs: Secondary | ICD-10-CM | POA: Insufficient documentation

## 2011-01-29 NOTE — Assessment & Plan Note (Signed)
Went back through his A1c last year. He has never been higher than 5.4.  I am unsure about the actual diagnosis of diabetes. When asked about this he states that the physician told him that long time ago. I did fill out paperwork for diabetic shoes as I believe they will help with his support while walking to prevent any calluses from the poor circulation in his feet. However did not fill out work for any diabetic testing his A1c is still low. Her insurance will not pay for diabetic testing with A1c at that level. I now wonder if his falls are from hypoglycemia. I told him to stop taking his glipizide. We will resume check his A1c in 2 months after not taking any glipizide.

## 2011-01-29 NOTE — Telephone Encounter (Signed)
Pt seen 2 days ago & wanted a CT scan for possible ruptured disc & wanted to know if MD has put in the orders?

## 2011-01-29 NOTE — Telephone Encounter (Signed)
Attempt to call back Patrick Holt, had a message.  In short, this was the extent of our visit: - Patient's A1c is 5.2 and it has been this way for over a year. I am not sure he even has diabetes. - I told him to stop taking his glipizide as this could be causing his sugars are low. -He does have poor lower extremity circulation and I do believe diabetic shoes will help him so I filled out the paperwork for this. - However with an A1c that low I doubt that he will be able to get any insulin testing materials. I did discuss this with him at length he expressed understanding at this - I definitely did NOT start insulin for the reasons listed above.

## 2011-01-29 NOTE — Telephone Encounter (Signed)
MRI ordered

## 2011-01-29 NOTE — Progress Notes (Signed)
  Subjective:    Patient ID: Patrick Holt, male    DOB: [birth year], 60 y.o.   MRN: [id number]  HPI 1.  Diabetic paperwork:  Please see previous office visit notes and [place] notes regarding this. In short he is here for appointment to review whether he needs diabetic shoes as well as diabetic supplies. He states he checks his blood sugars once a day. When in the low 100 range. Does have paresthesias bilateral lower extremities. Does have episodes of falling. He has not checked blood sugars when this occurs. No sweatiness or recognized symptoms of hypoglycemia  2.  Back pain:  Present bilaterally lumbar region. This is a long-term problem the patient but has had recent exacerbation of his falls. Has not had any real increase in pain since he last saw me. Does complain of bilateral paresthesias as noted in #1 above. Weakness and pain keep them from him ambulating without motorized wheelchair. He is worried this may be worsening.  Not incontinent of bowel or bladder.  3.  seizure-like activity: Patient states she's had several instances where he freezes. States this often happens with his mother-in-law present. She becomes angry because he will not answer questions. He states he freezes in place and cannot move or speak. This lasts anywhere from 15-20 minutes. He is concerned about a stroke. Denies any focal neurological deficits. Has had 3 episodes of this since he last saw me 2 months ago.   Review of Systems See HPI above for review of systems.       Objective:   Physical Exam Gen:  Alert, cooperative patient who appears stated age in no acute distress.  Vital signs reviewed. Pulm:  Clear to auscultation bilaterally with good air movement.  No wheezes or rales noted.   Cardiac:  Regular rate and rhythm without murmur auscultated.  Good S1/S2. MSK:  Tender bilaterally paraspinal or region Ext:  No edema noted. CV: DP pulses +1 bilaterally. Unable to palpate PT pulses bilaterally Neuro:   Proprioception intact bilateral lower extremities. Decreased pinprick and light sensation perception bilateral toes. This is intact however from the midfoot upwards. No clonus or signs of upper motor neuron dysfunction.         Assessment & Plan:

## 2011-01-29 NOTE — Assessment & Plan Note (Signed)
Refill oxycodone today. This may actually be acutely worsening. Along with paresthesias plan for MRI to assess for possible radiculopathy or myelopathy. No actual red flags, no bowel or bladder incontinence.

## 2011-01-29 NOTE — Assessment & Plan Note (Signed)
Patient has a history of seizure disorder. Has not had any seizures in at least 10 years. He is not taking any current medications for this at this time. I did discuss with him the seriousness of any recurrent seizures. I told him that if these episodes occur again these are emergencies esp he is concerned about a stroke. He would need to call 911 immediately. Patient expressed understanding of this. Question seizure versus pseudoseizure we would need to be able to evaluate patient while they are occurring to fully address this

## 2011-02-01 NOTE — Telephone Encounter (Signed)
I spoke with Patrick Holt and explained below. She states that Patrick Holt has told her that he does inded have diabetes and that he has been on insulin. She is going to give below instructions to patient.Patrick Holt

## 2011-02-02 NOTE — Telephone Encounter (Signed)
Called patient and informed him of appointment set up 12/21 @ 3pm patient to arrive@ 2:45pm. [facility]

## 2011-02-03 ENCOUNTER — Ambulatory Visit: Payer: Medicare HMO | Admitting: Family Medicine

## 2011-02-05 ENCOUNTER — Inpatient Hospital Stay (HOSPITAL_COMMUNITY)
Admission: RE | Admit: 2011-02-05 | Discharge: 2011-02-05 | Payer: Medicare HMO | Source: Ambulatory Visit | Attending: Family Medicine | Admitting: Family Medicine

## 2011-02-18 ENCOUNTER — Ambulatory Visit (HOSPITAL_COMMUNITY)
Admission: RE | Admit: 2011-02-18 | Discharge: 2011-02-18 | Disposition: A | Discharge status: Home / Self Care | Payer: Medicare HMO | Source: Ambulatory Visit | Attending: Family Medicine | Admitting: Family Medicine

## 2011-02-18 DIAGNOSIS — M51379 Other intervertebral disc degeneration, lumbosacral region without mention of lumbar back pain or lower extremity pain: Secondary | ICD-10-CM | POA: Insufficient documentation

## 2011-02-18 DIAGNOSIS — G7109 Other specified muscular dystrophies: Secondary | ICD-10-CM | POA: Insufficient documentation

## 2011-02-18 DIAGNOSIS — M5137 Other intervertebral disc degeneration, lumbosacral region: Secondary | ICD-10-CM | POA: Insufficient documentation

## 2011-02-18 DIAGNOSIS — M47817 Spondylosis without myelopathy or radiculopathy, lumbosacral region: Secondary | ICD-10-CM | POA: Insufficient documentation

## 2011-02-18 DIAGNOSIS — M545 Low back pain: Secondary | ICD-10-CM

## 2011-03-02 ENCOUNTER — Ambulatory Visit: Payer: Medicare HMO | Admitting: Family Medicine

## 2011-03-08 ENCOUNTER — Ambulatory Visit (INDEPENDENT_AMBULATORY_CARE_PROVIDER_SITE_OTHER): Payer: Medicare HMO | Admitting: Family Medicine

## 2011-03-08 DIAGNOSIS — M545 Low back pain, unspecified: Secondary | ICD-10-CM

## 2011-03-08 DIAGNOSIS — M5410 Radiculopathy, site unspecified: Secondary | ICD-10-CM

## 2011-03-08 DIAGNOSIS — K030 Excessive attrition of teeth: Secondary | ICD-10-CM

## 2011-03-08 DIAGNOSIS — IMO0002 Reserved for concepts with insufficient information to code with codable children: Secondary | ICD-10-CM

## 2011-03-08 DIAGNOSIS — W19XXXA Unspecified fall, initial encounter: Secondary | ICD-10-CM

## 2011-03-08 DIAGNOSIS — H9209 Otalgia, unspecified ear: Secondary | ICD-10-CM

## 2011-03-09 DIAGNOSIS — M5410 Radiculopathy, site unspecified: Secondary | ICD-10-CM | POA: Insufficient documentation

## 2011-03-09 NOTE — Progress Notes (Signed)
  Subjective:    Patient ID: Patrick Holt, male    DOB: [birth year], 61 y.o.   MRN: [id number]  HPI 1.  FU MRI: Patient here to discuss followup for his MRI. His continued to complain of bilateral lumbar back pain as well as right hip pain. He continues to complain of weakness and paresthesias bilateral legs but worse in his left leg. This seems to occur in a sciatic distribution. He states he is basically not related to his wheelchair secondary to weakness. No urinary or bowel incontinence. No saddle anesthesia. He is concerned that the pain and paresthesias are increasing.  2.  Dental referral:  Complaining of poor dental hygeine lower teeth. He has already had all of his upper teeth removed.  He would like a referral to a dentist. He denies any current dental infections. Does have pain when he chews. States he is using to toothbrush and dental floss and including mouthwash every single day  3.  ENT referral:  States he was seen regularly by ENT Dr. Pollyann Kennedy until his insurance changed and then no longer took his new insurance. He has no hearing in his right ear. He states he had some sort of trauma to that ear but does not remember what it was. States is now losing hearing in his left ear. The patient has pain in his left ear. No drainage from either. No recent illnesses.  The following portions of the patient's history were reviewed and updated as appropriate: allergies, current medications, past medical history, family and social history, and problem list, including fact patient is current smoker.  Counseled to quit, patient states he is "1 of 10 people in the [place] who cannot stop smoking or he will die." Review of Systems See HPI above for review of systems.       Objective:   Physical Exam  Gen:  Alert, cooperative patient who appears stated age in no acute distress.  Sitting in wheelchair.  Vital signs reviewed. HEENT:  [place]/AT.  EOMI, PERRL.  MMM, tonsils non-erythematous, non-edematous.   External ears WNL, Right TM with either cerumen impaction versus scarred over TM, it is difficult to tell by examination with otoscope.  Left ear with some mild cerumen but otherwise WNL. Cardiac:  Regular rate and rhythm without murmur auscultated.  Good S1/S2. Pulm:  Clear to auscultation bilaterally with good air movement.  No wheezes or rales noted.   MSK:  TTP bilateral paraspinal muscles. Neuro: Sensation intact bilateral lower extremities. Does report some paresthesias on light touch. Motor strength 3/5 bilateral lower extremities. No clonus noted.      Assessment & Plan:

## 2011-03-09 NOTE — Assessment & Plan Note (Signed)
Possibly secondary to impingement syndrome, unclear about this.  Also could be due to generalized deconditioning and weakness.  No further falls since last visit however.

## 2011-03-09 NOTE — Assessment & Plan Note (Signed)
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 Patient is extremely worried about his decreasing motor strength and paresthesias. We'll obtain referral to orthopedic spine surgeon today. Provided red flags and warnings regarding cauda equina syndrome. He has had no problems with this in the past. Also long discussion about spinal stenosis which patient who had questions about this versus foraminal impingement which is what MRI showed for him.

## 2011-03-10 ENCOUNTER — Telehealth: Payer: Self-pay | Admitting: Family Medicine

## 2011-03-10 ENCOUNTER — Telehealth: Payer: Self-pay | Admitting: *Deleted

## 2011-03-10 DIAGNOSIS — K030 Excessive attrition of teeth: Secondary | ICD-10-CM | POA: Insufficient documentation

## 2011-03-10 DIAGNOSIS — H9209 Otalgia, unspecified ear: Secondary | ICD-10-CM | POA: Insufficient documentation

## 2011-03-10 NOTE — Telephone Encounter (Signed)
Spoke with patient about his dental referral request. He states that he does not have dental through [facility]. Patient states that he had spoken with Dr. Gwendolyn Grant and [place] I informed him that the dental clinic was just done and that it may be a couple of months before another clinic will be here again.   Also patient and I had discussed his other 2 referrals in. One is for ENT and other for orthopaedic surgery. After speaking with the patient he informed me that these referrals cannot be done until March. He says that this is because he has no family in town and that he only receives checks the 1st of every month. I spoke with him about how he stressedthe importance of these referral that he made during the office visit with Dr. Gwendolyn Grant. He still insisted that they will have to wait until march and that he will call back when it is ok to do the referrals.   I have deferred referral until March. Wanted to note this in chart due to patient's history.Logan Bores, Roselyn Meier

## 2011-03-10 NOTE — Telephone Encounter (Signed)
Message copied by Farrell Ours on Wed Mar 10, 2011  1:17 PM ------      Message from: Deno Etienne      Created: Wed Mar 10, 2011 11:53 AM       Say-ra,            Imma let you handle YO boyfriend... LOL!![place]            ~t      ----- Message -----         From: Renold Don, MD         Sent: 03/10/2011  11:01 AM           To: [facility]            Brett Canales is also asking for a  Dental referral.  Does anybody take [employer] that we know?  Thanks            Trey Paula

## 2011-03-10 NOTE — Telephone Encounter (Signed)
Noted.  Richardson, Jeannette Ann, RN  

## 2011-03-10 NOTE — Assessment & Plan Note (Signed)
No red flags.  As stated above, patient with worsening symptoms however. He desires referral to Orthopedic surgeon for discussion on possible back surgery to relieve some of the impingement if possible. Will place referral

## 2011-03-10 NOTE — Telephone Encounter (Signed)
Mr. Sirico want to have put in the referral that he must not be taken off his Cymbalta  60 mg taken twice or his Alprazolam 1mg  taken 3 times daily because he will have seizures without these meds.

## 2011-03-10 NOTE — Assessment & Plan Note (Signed)
Believe some evidence of cerumen impaction versus chronic scarring of Right ear.  Patient now with decreased hearing of Left ear.  He desires ENT referral as he was seeing ENT regularly prior to insurance changing.  Prior ENT will not take current insurance.

## 2011-03-10 NOTE — Telephone Encounter (Signed)
Thanks for all your work.  Will see him back next visit.

## 2011-03-10 NOTE — Assessment & Plan Note (Addendum)
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   Will refer to dentist who takes [facility] if possible. Would likely benefit from removal of teeth and denture fitting.    If not, await for free dental clinics.

## 2011-03-11 NOTE — Telephone Encounter (Signed)
I spoke with Patrick Holt and we talked about the medication below. This is medication that is prescribed by another doctor

## 2011-04-06 ENCOUNTER — Ambulatory Visit: Payer: Medicare HMO | Admitting: Family Medicine

## 2011-04-07 ENCOUNTER — Ambulatory Visit: Payer: Medicare HMO | Admitting: Family Medicine

## 2011-04-13 ENCOUNTER — Ambulatory Visit (INDEPENDENT_AMBULATORY_CARE_PROVIDER_SITE_OTHER): Payer: Medicare HMO | Admitting: Family Medicine

## 2011-04-13 ENCOUNTER — Encounter: Payer: Self-pay | Admitting: Family Medicine

## 2011-04-13 VITALS — BP 122/78 | HR 88 | Temp 98.7°F | Ht 68.0 in | Wt 235.0 lb

## 2011-04-13 DIAGNOSIS — M5410 Radiculopathy, site unspecified: Secondary | ICD-10-CM

## 2011-04-13 DIAGNOSIS — IMO0002 Reserved for concepts with insufficient information to code with codable children: Secondary | ICD-10-CM

## 2011-04-13 DIAGNOSIS — E119 Type 2 diabetes mellitus without complications: Secondary | ICD-10-CM

## 2011-04-13 DIAGNOSIS — H9209 Otalgia, unspecified ear: Secondary | ICD-10-CM

## 2011-04-13 DIAGNOSIS — F17200 Nicotine dependence, unspecified, uncomplicated: Secondary | ICD-10-CM

## 2011-04-13 DIAGNOSIS — K030 Excessive attrition of teeth: Secondary | ICD-10-CM

## 2011-04-13 LAB — POCT GLYCOSYLATED HEMOGLOBIN (HGB A1C): Hemoglobin A1C: 5.8

## 2011-04-16 NOTE — Assessment & Plan Note (Signed)
Patient does not have any dental insurance. He discussed multiple ways to get him to be seen by a dentist. We have also discussed possibly being sent to [facility] the patient states is very difficult for him to leave the city. I tried to discuss with them obtaining an orange cards that he be put on the list to be seen by dentist however he kept saying he did not need the arch car because his bills are always paid for. Currently we're waiting for him to be seen during a dental clinic. I have asked him to reconsider the [facility] drive to be seen there at [employer].

## 2011-04-16 NOTE — Progress Notes (Signed)
  Subjective:    Patient ID: GOLDEN GILREATH, male    DOB: 25-Oct-1950, 61 y.o.   MRN: [id number]  HPI 1.  Ear pain:  Persists. Present in right ear. Patient lives also some diminished hearing in right ear as well. He states that he has enough money now for ear nose and throat physician referral. No fevers or chills.  #2. Teeth pain: Teeth are continuing to cause a considerable amount of pain. He is asking me to numb his jaw and "pull his teeth out here in clinic." He has pain when talking or trying to eat. No drainage. No fevers or chills.  3.  back pain: Also persists. Patient, as noted above, states he has money to like to have referral to orthopedic physician which was placed one month ago. He states he continues to have lower extremity weakness. Occasionally has paresthesias. No incontinence. No cyanosis.  The following portions of the patient's history were reviewed and updated as appropriate: allergies, current medications, past medical history, family and social history, and problem list, including fact patient is a smoker.   Review of Systems See HPI above for review of systems.       Objective:   Physical Exam  Gen:  Alert, cooperative patient who appears stated age in no acute distress.  Vital signs reviewed. Meds: Was not atraumatic Ears: Left ear within normal limits. Right ear with difficulty visualizing tympanic membrane. Ear canals clear. There is what may be a cholesteatoma obscuring tympanic membrane. Mouth very poor dental hygiene. Teeth are necrotic and black appearing. His multiple broken teeth throughout. There is no evidence of any areas of fluctuance, abscess, erythema, or drainage. Neck no cervical lymphadenopathy Cardiac:  Regular rate and rhythm without murmur auscultated.  Good S1/S2. Pulm:  Clear to auscultation bilaterally with good air movement.  No wheezes or rales noted.   Abd:  Soft/nondistended/nontender.  Good bowel sounds throughout all four quadrants.   No masses noted.  MSK:  Paraspinal muscles are tender bilaterally lumbar area. No spine tenderness. Patient ambulates with use of walker. DTRs +2 bilaterally. No clonus. Sensation intact bilateral lower extremities        Assessment & Plan:

## 2011-04-16 NOTE — Assessment & Plan Note (Signed)
As noted below, patient has been paid March 1 and plans to keep referral for Ortho now that he can pay the required copay. No red flags on exam today.   Will forward so nurses can make referral which was placed last visit.

## 2011-04-16 NOTE — Assessment & Plan Note (Signed)
I am concerned there may be cholesteatoma in Right ear. Referral to ENT placed last visit but patient unable to pay copay, he states he receives next paycheck March 1 and will go then.

## 2011-04-20 ENCOUNTER — Telehealth: Payer: Self-pay | Admitting: Family Medicine

## 2011-04-20 NOTE — Telephone Encounter (Signed)
Pt rec'd Lomotil and it is for only 1 per day and he takes 4 per day.  [facility]  Is also asking about referrals to several places  Is asking for Dr Gwendolyn Grant to call his dentist, dental dept at [facility] - so that he can get his teeth pulled.

## 2011-04-21 MED ORDER — DIPHENOXYLATE-ATROPINE 2.5-0.025 MG PO TABS: 1.0000 | ORAL_TABLET | Freq: Four times a day (QID) | ORAL | Status: DC | PRN

## 2011-04-23 ENCOUNTER — Telehealth: Payer: Self-pay | Admitting: Family Medicine

## 2011-04-23 NOTE — Telephone Encounter (Signed)
Who is supposed to call and discuss his Orange Card with him?  I'm not sure what this call is about

## 2011-04-23 NOTE — Telephone Encounter (Signed)
I don't have a clue as to what this is about.... I will call him and ask him about this on Monday.Patrick Holt

## 2011-04-23 NOTE — Telephone Encounter (Signed)
Patient want to let you know that he has certification through hosptial and billing office.  Do not need orange card f rom [facility].  Please call to discuss

## 2011-05-03 ENCOUNTER — Ambulatory Visit (INDEPENDENT_AMBULATORY_CARE_PROVIDER_SITE_OTHER): Payer: Medicare HMO | Admitting: Family Medicine

## 2011-05-03 ENCOUNTER — Encounter: Payer: Self-pay | Admitting: Family Medicine

## 2011-05-03 VITALS — BP 122/64 | HR 76 | Temp 99.1°F | Ht 68.0 in | Wt 237.0 lb

## 2011-05-03 DIAGNOSIS — E785 Hyperlipidemia, unspecified: Secondary | ICD-10-CM

## 2011-05-03 DIAGNOSIS — M545 Low back pain: Secondary | ICD-10-CM

## 2011-05-03 DIAGNOSIS — Z79899 Other long term (current) drug therapy: Secondary | ICD-10-CM

## 2011-05-03 IMAGING — CR DG HIP COMPLETE 2+V*R*
3 series · 3 of 3 positions shown · non-contrast
Comparison: CT abdomen 08/11/2005

CLINICAL DATA: Fall , hip pain

RIGHT HIP - COMPLETE 2+ VIEW

[t pelvis a.p. (1 of 2)]
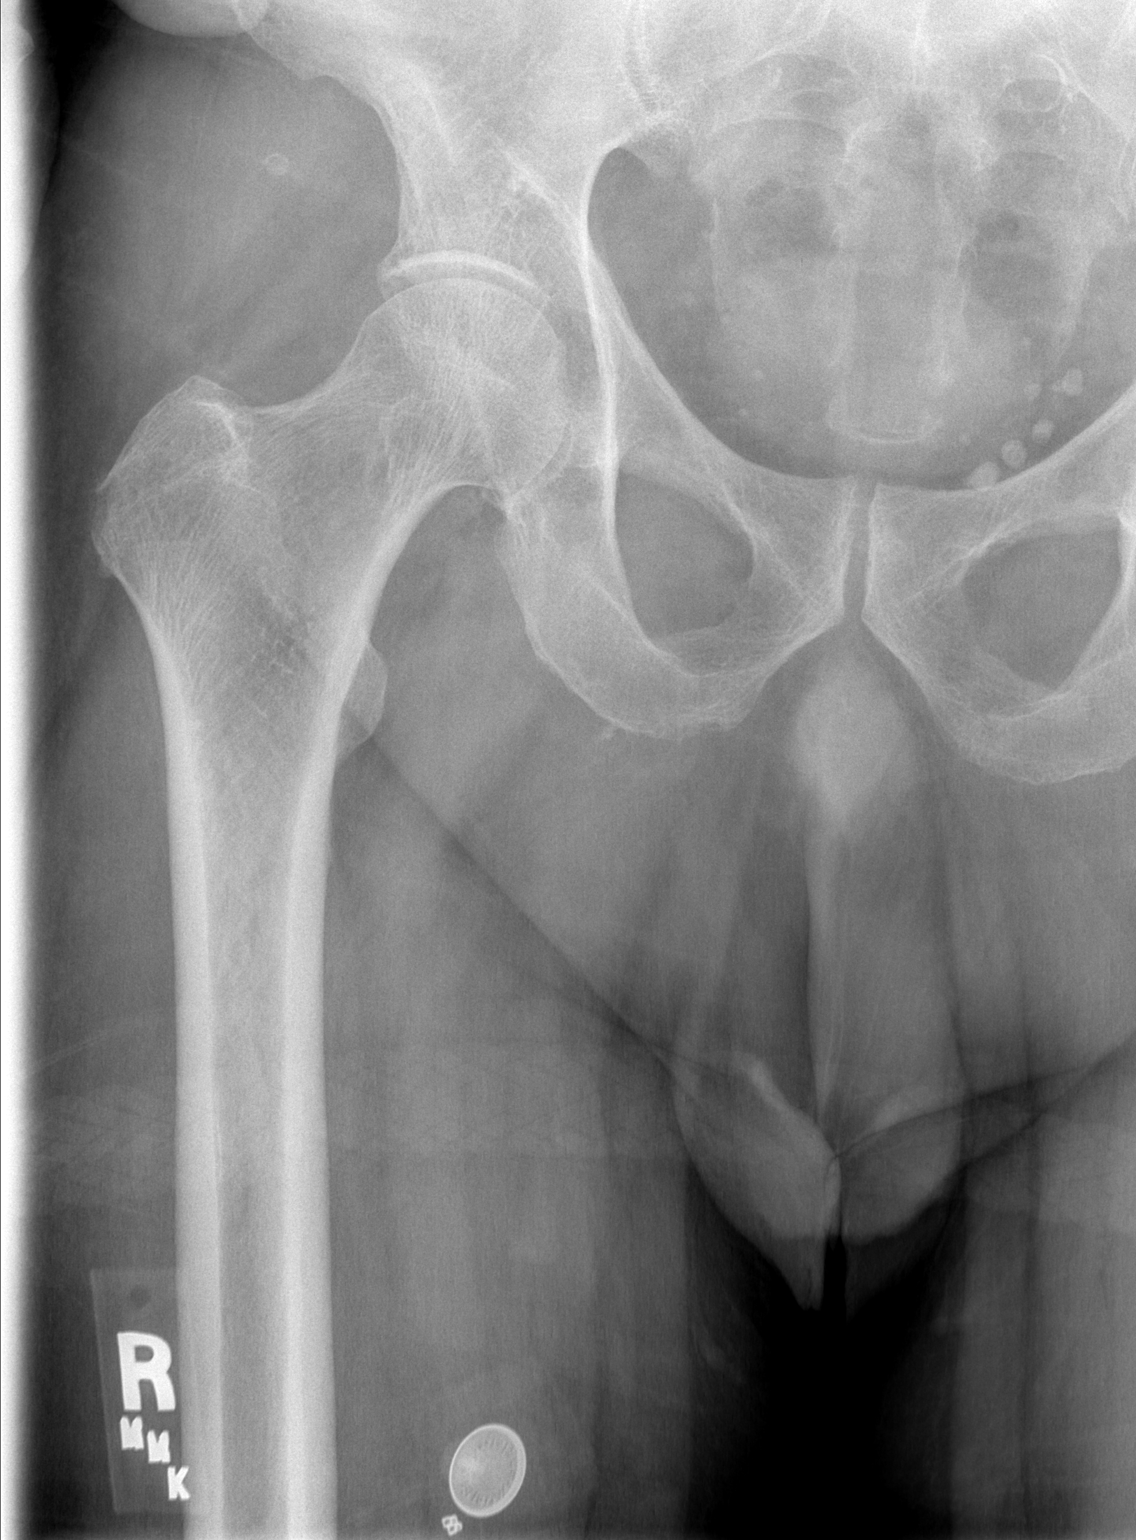

[t pelvis a.p. (2 of 2)]
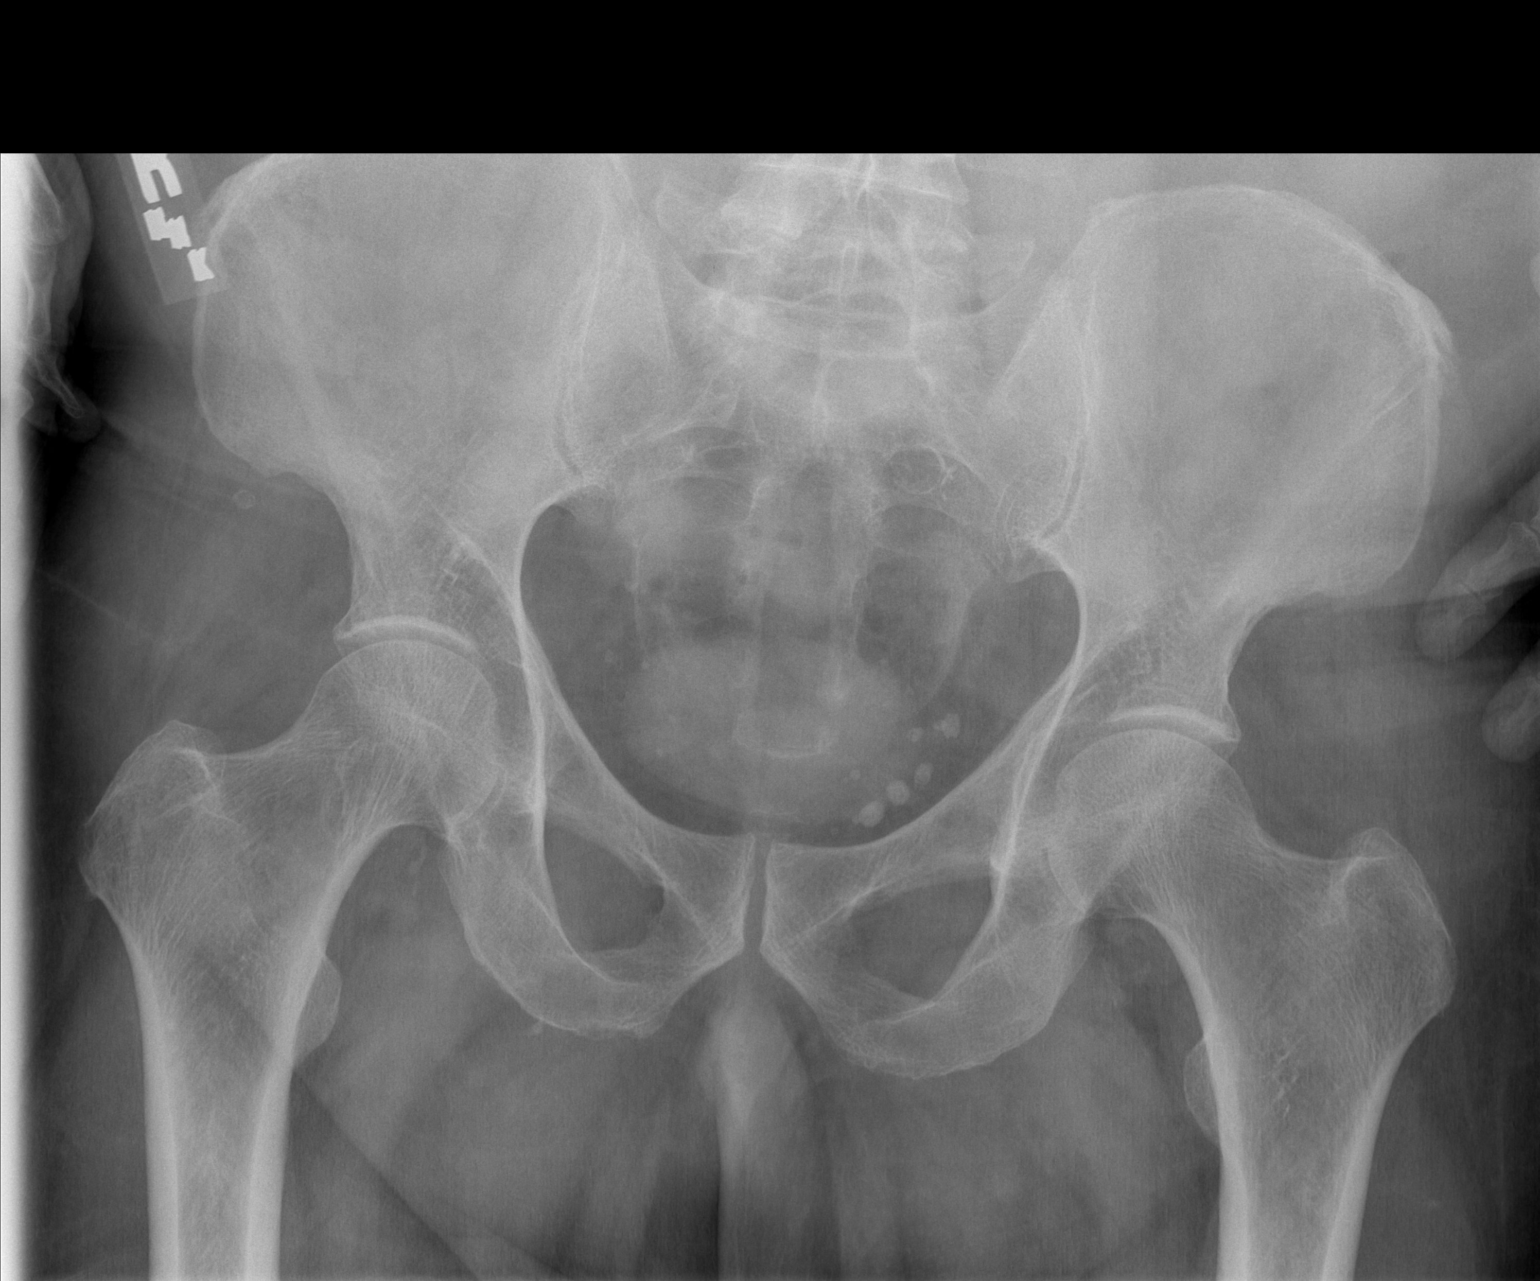

[t hip frog leg right]
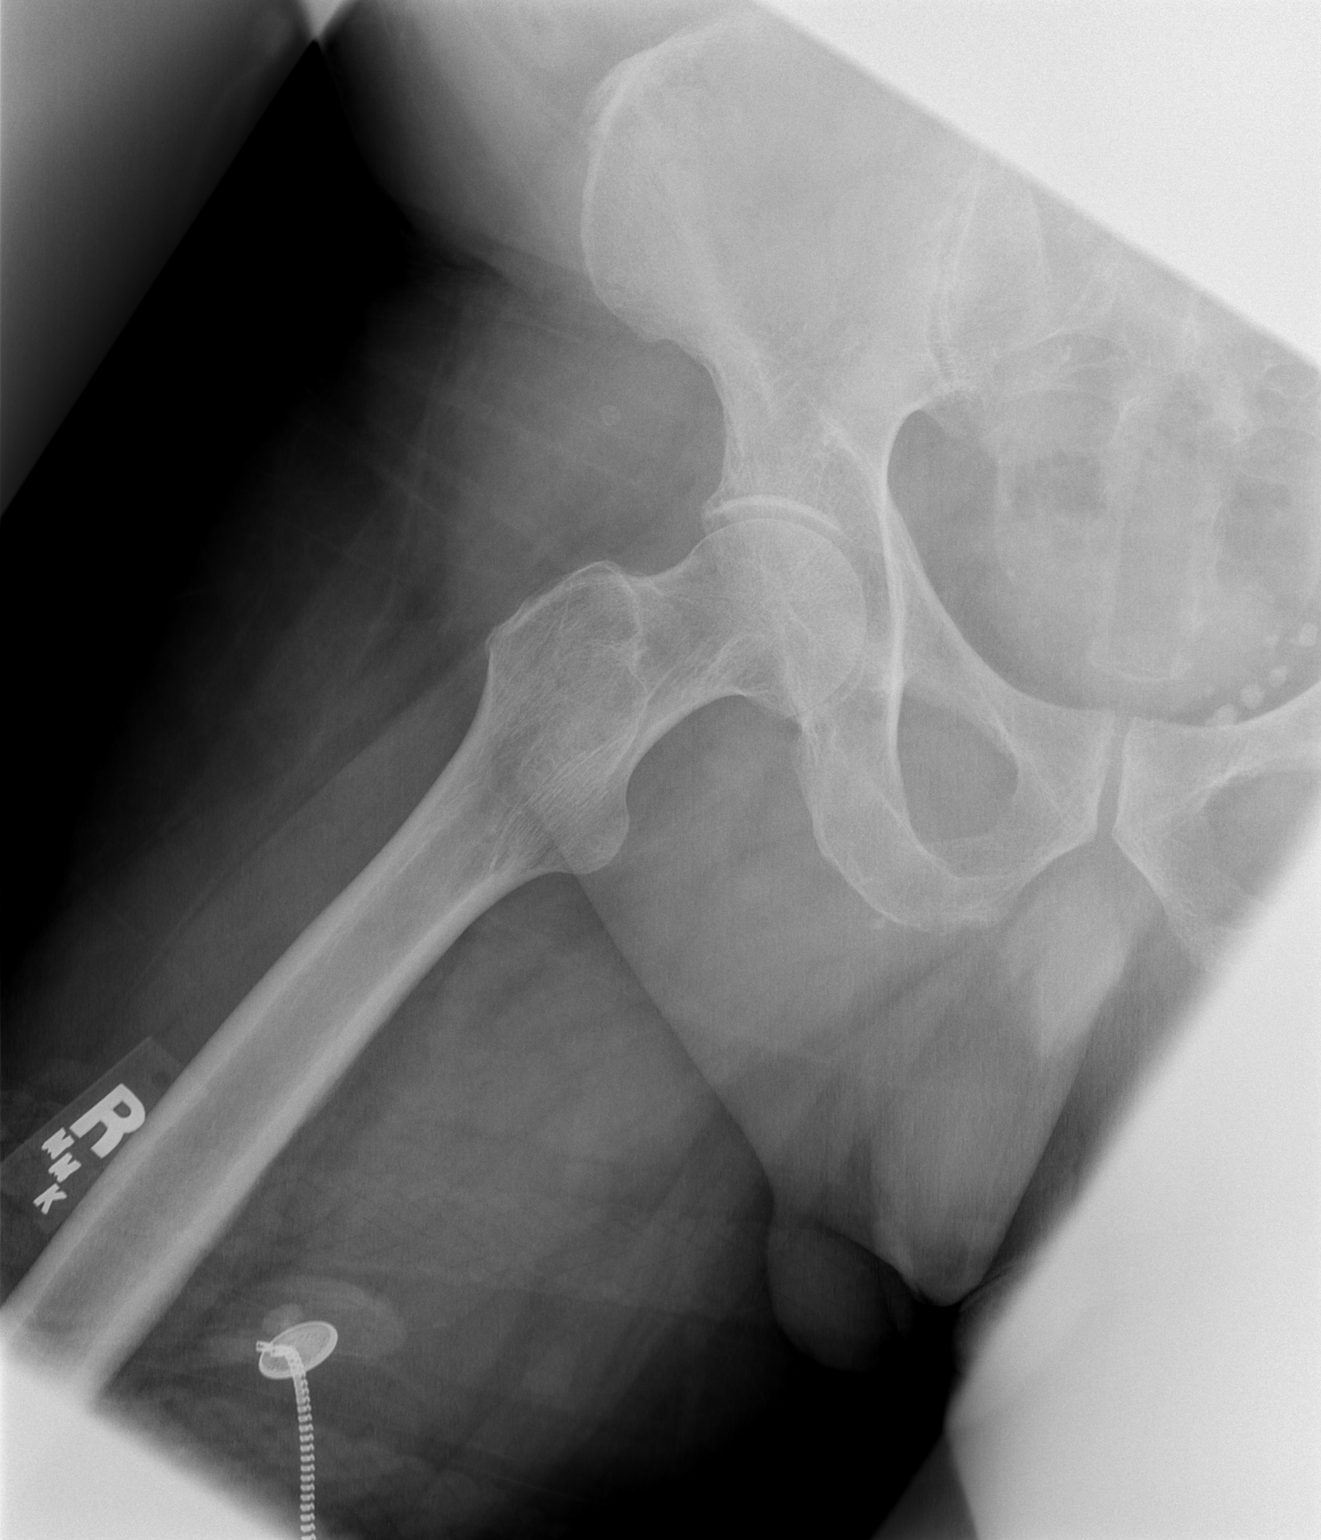

[3 of 3 positions shown; findings below may reference images not displayed]

FINDINGS: The hips are located.  No evidence of right femoral neck
fracture.  No evidence of pelvic fracture or sacral fracture.
IMPRESSION: No acute osseous abnormality.

## 2011-05-03 MED ORDER — METHOTREXATE 2.5 MG PO TABS: 7.5000 mg | ORAL_TABLET | ORAL | Status: DC

## 2011-05-03 MED ORDER — ESOMEPRAZOLE MAGNESIUM 40 MG PO CPDR: 40.0000 mg | DELAYED_RELEASE_CAPSULE | Freq: Every day | ORAL | Status: DC

## 2011-05-03 MED ORDER — OXYCODONE HCL 5 MG PO TABS: 5.0000 mg | ORAL_TABLET | Freq: Three times a day (TID) | ORAL | Status: DC | PRN

## 2011-05-03 NOTE — Patient Instructions (Signed)
We will check your blood work today for the methotrexate. I refilled your Oxycodone. Huntley Dec is getting the referral for Orthopedics and ENT.   Come back and see me in 2 months or earlier if you need it.

## 2011-05-04 LAB — COMPREHENSIVE METABOLIC PANEL
AST: 16 U/L (ref 0–37)
BUN: 10 mg/dL (ref 6–23)
CO2: 34 mEq/L — ABNORMAL HIGH (ref 19–32)
Calcium: 9.3 mg/dL (ref 8.4–10.5)
Chloride: 98 mEq/L (ref 96–112)
Creat: 0.74 mg/dL (ref 0.50–1.35)

## 2011-05-04 LAB — CBC
Hemoglobin: 14.8 g/dL (ref 13.0–17.0)
RBC: 4.4 MIL/uL (ref 4.22–5.81)

## 2011-05-04 LAB — LDL CHOLESTEROL, DIRECT: Direct LDL: 137 mg/dL — ABNORMAL HIGH

## 2011-05-06 NOTE — Progress Notes (Signed)
  Subjective:    Patient ID: Patrick Holt, male    DOB: 1950-07-07, 61 y.o.   MRN: [id number]  HPI  Chronic Pain Follow-Up Assessment Chronic Pain Diagnosis: chronic back pain  Pain scale rating at its BEST in the past 24 hours (1 to 10): 5 Pain scale rating at its WORST in the past 24 hours (1 to 10): 10 Adverse Effects:  (None x__ Nausea__ Vomiting__ Confusion__ Sleepiness__ Fatigue__ Constipation__ Other__) Treatment of Adverse Effects: n/a Since the last clinic visit, how much relief have pain treatment and medication provided? Please circle the one percentage that shows how much relief you have received: 0%__ 10%__ 20%__ 30%__ 40%__ 50%__ 60%__ 70%__ 80%_x_ 90%__ 100%__ Loraine Leriche the one number that describes how, during the past 24 hours, pain has interfered with your: General Activity 0__ 1__ 2__ 3__ 4__ 5__ 6__ 7__ 8_x_ 9__ 10__ Does not interfere      Completely interferes Mood 0__ 1__ 2__ 3__ 4__ 5__ 6_x_ 7__ 8__ 9__ 10__ Does not interfere      Completely interferes Ability to work (in or out of the home) 0__ 1__ 2__ 3__ 4__ 5__ 6__ 7__ 8__ 9__ 10_x_ Does not interfere      Completely interferes Interactions with other people 0__ 1__ 2__ 3__ 4__ 5__ 6__ 7__ 8__ 9_xx_ 10__ Does not interfere      Completely interferes Sleep 0__ 1__ 2__ 3__ 4__ 5__ 6__ 7_x_ 8__ 9__ 10__ Does not interfere      Completely interferes  Enjoyment of life 0__ 1__ 2__ 3__ 4__ 5__ 6__ 7__ 8__ 9__ 10__ Does not interfere      Completely interferes   Review of Systems No fevers or chills. No bowel or bladder incontinence.      Objective:   Physical Exam  Gen:  Alert, cooperative patient who appears stated age in no acute distress.  Vital signs reviewed. Back - Normal skin, Spine with normal alignment and no deformity.  No tenderness to vertebral process palpation.  Paraspinous muscles are tender BL with palpable spasm noted.   Decreased flexion and extension of neck, limited by lower back  pain.   Neuro:  Sensation equal and symmetric BL.  Ambulates with walker today          Assessment & Plan:

## 2011-05-06 NOTE — Assessment & Plan Note (Signed)
Checking direct LDL today.  Adding methotrexate labs to this as well (CMET)

## 2011-05-06 NOTE — Assessment & Plan Note (Signed)
Provided bimonthly Oxycodone refill today.   No acute worsening or improvement in pain or function.  Still attempting Orthopedic refill.  Finances (covering copay) has limited this thus far.

## 2011-05-09 ENCOUNTER — Telehealth: Payer: Self-pay | Admitting: Family Medicine

## 2011-05-09 MED ORDER — POTASSIUM CHLORIDE CRYS ER 20 MEQ PO TBCR: 20.0000 meq | EXTENDED_RELEASE_TABLET | Freq: Every day | ORAL | Status: DC

## 2011-05-09 NOTE — Telephone Encounter (Signed)
Hypokalemic on last lab check, sent in potassium.

## 2011-05-10 NOTE — Telephone Encounter (Signed)
Called Patrick Holt and informed him of his results and told him that Dr. Gwendolyn Grant has called in an Rx for him to p/u.  Patrick Holt has an appt with Dr. Shon Baton [facility]) 05/12/2011 Thursday @ 745 am  Address:  [address] [employer]  Phone:  [phone]    Narda Bonds (ENT) 05/13/2011  @ 100 pm  Address:  [address] Phone:  [phone]  Patrick Holt was told that if he cannot keep these appts that HE WILL NEED TO CALL THEIR OFFICES TO CANCEL/RESCHEDULE WE WILL NOT DO THIS FOR HIM.  Patrick Holt voiced understanding and agreed.  He also informed me that he only has $.04 to his name and his check will not come in until 04.03.2013. He also told me that he has not had anything to eat for 1 week and that he is in a lot of pain due to his teeth being rotten and breaking at the gum line he was asking about the dental referral. I told him that because he has Medicare HE will need to find a dentist, because Medicare will not pay for dental services he will have to pay out of pocket.Loralee Pacas [place]

## 2011-05-18 NOTE — Telephone Encounter (Signed)
Please call Mr. Keeven regarding the problem he's having with his tooth and not being able to contact a dentist that takes medicare.

## 2011-05-24 ENCOUNTER — Telehealth: Payer: Self-pay | Admitting: Family Medicine

## 2011-05-24 NOTE — Telephone Encounter (Signed)
Ha a question about the amount of potassium that he got.  He has been taking this regularly and wondering if he should continue since he only got 7 pills from Dr Gwendolyn Grant.  Wants to talk to Dr Gwendolyn Grant He wants Dr Gwendolyn Grant to speak to [facility] in the dental dept at [facility] hosp.

## 2011-05-25 NOTE — Telephone Encounter (Signed)
Called and lvm informing pt that Dr. Gwendolyn Grant had only gave him enough medication for the 7 days. I said that I would forward this concern to Dr. Gwendolyn Grant and also have him to address the dental matter.Patrick Holt

## 2011-05-25 NOTE — Telephone Encounter (Signed)
Please return patient's call regarding his potassium tablets

## 2011-05-25 NOTE — Telephone Encounter (Signed)
Called and lvm for pt to return call.Patrick Holt  

## 2011-05-25 NOTE — Telephone Encounter (Signed)
Spoke with pt and he told me that his potassium and blood sugars drops low all the time. He has been taking this twice a day (only asking for meds for potassium). rescheduled appts with  ENT 5.6.13 and [facility] 5.7.2013. Dr. Gwendolyn Grant will need to call Dr. Neoma Laming (dentist) at [facility] to have surgery done on teeth.  Not to tell Delores who Dr. Gwendolyn Grant is calling about. I read ALL of this information that pt is talking about back to him to ensure that we are properly addressing his concerns. Pt agreed to what was given. I told him that I would forward this to Dr. Gwendolyn Grant for review and either I would call him back or Dr. Gwendolyn Grant would speak with him about this. Pt voiced understanding.Loralee Pacas [place]   This is the pt's cell phone that is on him at all times he can be reached at: [phone].Laureen Ochs, Viann Shove

## 2011-05-26 NOTE — Telephone Encounter (Signed)
Spoke with patient. He states he just picked up a prescription for potassium and we'll not need a refill for least a month or 2. He also like for me to personally called Dr. Valentino Hue regarding his teeth decay. He also wanted to let me know about moving his appointments.  Will attempt call to Dr. Kristin Bruins.

## 2011-05-28 ENCOUNTER — Telehealth: Payer: Self-pay | Admitting: Family Medicine

## 2011-05-28 NOTE — Telephone Encounter (Signed)
Advised patient that he would need appointment with MD if he wants laceration evaluated. He states  it does not  appear to require  sutures. Does not sound like area is gaping open . States area is red .  He has someone looking at it now. Advised  to keep area clean and dry . May apply neosporin. He will call Monday if he feels it needs to be seen and will make appointment.

## 2011-05-28 NOTE — Telephone Encounter (Signed)
Patient is calling because he fell yesterday and has a gash on his spine about a foot long.  He had an appt for Monday, but it is supposed to rain so he wants to cancel that and he wants to come in on Tuesday and just see the nurse.

## 2011-05-31 ENCOUNTER — Ambulatory Visit: Payer: Medicare HMO | Admitting: Family Medicine

## 2011-06-01 ENCOUNTER — Telehealth: Payer: Self-pay | Admitting: Family Medicine

## 2011-06-01 NOTE — Telephone Encounter (Signed)
Disabled Consideration Report placed in Dr. Tyson Alias box for completion.  Patrick Holt

## 2011-06-01 NOTE — Telephone Encounter (Signed)
Disability Consideration Request from [place] [facility] [place] to be completed by Gwendolyn Grant.

## 2011-06-02 ENCOUNTER — Telehealth: Payer: Self-pay | Admitting: Family Medicine

## 2011-06-02 NOTE — Telephone Encounter (Signed)
Hospice referral form dropped off by Case Manager to be filled out.  Please fax when completed or call him to pick up.

## 2011-06-02 NOTE — Telephone Encounter (Signed)
[facility] form placed in Dr. Tyson Alias box for review.  Ileana Ladd

## 2011-06-03 NOTE — Telephone Encounter (Signed)
Completed and given to Donna Loring.  

## 2011-06-03 NOTE — Telephone Encounter (Signed)
Disable Consideration form completed and mailed to patient. Ileana Ladd

## 2011-06-03 NOTE — Telephone Encounter (Signed)
[facility] form completed and faxed to [phone].  Ileana Ladd

## 2011-06-10 NOTE — Telephone Encounter (Signed)
I do not know why he was referred to Hospice.  I completed the paperwork provided that describes his chronic medical conditions.  However he does not have any terminal illnesses and does not have a life expectancy of less than 6 months.  I have discussed this with Jimmy Footman who will contact [name] regarding this.  He can contact Hospice as well if he has any questions.

## 2011-06-10 NOTE — Telephone Encounter (Signed)
Pt is asking why he hasn't heard from Hospice

## 2011-06-10 NOTE — Telephone Encounter (Signed)
Spoke with patient and informed him that he does not qualify for hospice, he was understanding about this

## 2011-07-07 ENCOUNTER — Telehealth: Payer: Self-pay | Admitting: Family Medicine

## 2011-07-07 DIAGNOSIS — E785 Hyperlipidemia, unspecified: Secondary | ICD-10-CM

## 2011-07-07 MED ORDER — OXYCODONE HCL 5 MG PO TABS: 5.0000 mg | ORAL_TABLET | Freq: Three times a day (TID) | ORAL | Status: DC | PRN

## 2011-07-07 NOTE — Telephone Encounter (Signed)
Will refill oxycodone and follow up with Dr. Gwendolyn Grant. Will see Dr. Lacretia Nicks on June 6th.

## 2011-07-07 NOTE — Telephone Encounter (Signed)
Will forward message to Dr. Denyse Amass who is taking messages for Dr. Gwendolyn Grant

## 2011-07-07 NOTE — Telephone Encounter (Signed)
Need refill on his oxycodone.  Completely out of medicine.  Please let patient know when ready for pickup.

## 2011-07-22 ENCOUNTER — Encounter: Payer: Self-pay | Admitting: Family Medicine

## 2011-07-22 ENCOUNTER — Encounter: Payer: Self-pay | Admitting: *Deleted

## 2011-07-22 ENCOUNTER — Ambulatory Visit (INDEPENDENT_AMBULATORY_CARE_PROVIDER_SITE_OTHER): Payer: Medicare HMO | Admitting: Family Medicine

## 2011-07-22 VITALS — BP 122/76 | HR 84 | Temp 98.7°F | Ht 68.0 in | Wt 237.0 lb

## 2011-07-22 DIAGNOSIS — N3941 Urge incontinence: Secondary | ICD-10-CM

## 2011-07-22 DIAGNOSIS — M25561 Pain in right knee: Secondary | ICD-10-CM | POA: Insufficient documentation

## 2011-07-22 DIAGNOSIS — M25569 Pain in unspecified knee: Secondary | ICD-10-CM

## 2011-07-22 DIAGNOSIS — B49 Unspecified mycosis: Secondary | ICD-10-CM | POA: Insufficient documentation

## 2011-07-22 DIAGNOSIS — E119 Type 2 diabetes mellitus without complications: Secondary | ICD-10-CM

## 2011-07-22 DIAGNOSIS — IMO0002 Reserved for concepts with insufficient information to code with codable children: Secondary | ICD-10-CM

## 2011-07-22 DIAGNOSIS — M5410 Radiculopathy, site unspecified: Secondary | ICD-10-CM

## 2011-07-22 LAB — POCT GLYCOSYLATED HEMOGLOBIN (HGB A1C): Hemoglobin A1C: 6

## 2011-07-22 MED ORDER — KETOROLAC TROMETHAMINE 60 MG/2ML IM SOLN
60.0000 mg | Freq: Once | INTRAMUSCULAR | Status: AC
  Administered 2011-07-22: 60 mg via INTRAMUSCULAR

## 2011-07-22 MED ORDER — OXYBUTYNIN CHLORIDE 5 MG PO TABS: 5.0000 mg | ORAL_TABLET | Freq: Three times a day (TID) | ORAL | Status: DC

## 2011-07-22 MED ORDER — CLOTRIMAZOLE 1 % EX SOLN: Freq: Two times a day (BID) | CUTANEOUS | Status: DC

## 2011-07-22 NOTE — Assessment & Plan Note (Signed)
Clotrimazole to treat.  

## 2011-07-22 NOTE — Assessment & Plan Note (Signed)
Unclear etiology. Before I could examine patient further, he had left.  Please see HPI regarding this.

## 2011-07-22 NOTE — Assessment & Plan Note (Signed)
Persists. Controlled with Oxycodone. Patient does not desire spinal fusion. He has never needed increase in pain medications -- if he does, this would warrant referral to pain management.

## 2011-07-22 NOTE — Progress Notes (Signed)
I was informed this afternoon by Jimmy Footman CMA that while she was in the room with patient, she leaned over to administer an injection at which point he said to her "you have beautiful breasts".  I confronted the patient about this comment.  He admitted saying it and began to make excuses as to why he said it.  I advised the patient that any future sexual or inappropriate comments to any of the [facility] staff would result in immediate dismissal from this practice.  Patient expressed understanding.

## 2011-07-22 NOTE — Assessment & Plan Note (Signed)
Patient would rather have TID instead of long-acting Oxybutynin. Made this switch today.

## 2011-07-22 NOTE — Progress Notes (Signed)
  Subjective:    Patient ID: Patrick Holt, male    DOB: 1950-08-25, 61 y.o.   MRN: [id number]  HPI 1.  Back pain:  Seen by Dr. [facility] yesterday for his back.  Per patient, was told that he needed to be seen by [facility], no need for surgery.  Told he needed a spinal fusion but he refused this because ex-wife had spinal fusion with many complications.      2.  Knee pain:  Had knee manipulated by Orthopedist yesterday.  Since then, has had pain in his knee.  Is worried he has an injury.  No swelling, redness, effusion.   3.  Urinary incontinence:  Present as has been for years.  Usually controlled by Oxybutynin.  He had run out of this and has not been refilled.  No bowel incontinence.  Describes stress incontinence.    4.  Rash on hands:  Present for 1-2 months.  Has been using bleach and hydrogen peroxide to remove "dirt-like" rash on his hands.  Washing hands with anti-bacterial soap.    The following portions of the patient's history were reviewed and updated as appropriate: allergies, current medications, past medical history, family and social history, and problem list.  Patient is a current smoker.    ** Of note:  Patient made inappropriate comment to nurse while he was having his Toradol shot given.  I had stepped out of the room to make some copies of his paperwork.  At that point, he spoke to clinic manager and left before I could finish up with him. I was unable to finish the above history  Review of Systems See HPI above for review of systems.       Objective:   Physical Exam Gen:  Elderly man sitting in manual wheelchair, crying and moaning in pain when I entered room.  After he began talking, stopping moaning and no further crying.  Talking animatedly, was in usual mood, smiling.  When I left room I heard him complaining of pain again.   Head:  Landa/AT Mouth: MMM Cardiac:  Regular rate and rhythm without murmur auscultated.  Good  S1/S2. Pulm:  Clear to auscultation bilaterally with good air movement.  No wheezes or rales noted.   MSK:  Tender BL lower lumbar region of back.  Straight leg raise positive for back pain bilaterally.  Right knee tender along joint line.  No joint laxity noted.  Anterior and posterior drawer test negative.  No effusion noted Skin:  Cracked, dark skin along fingers and palms of bilateral hands.         Assessment & Plan:

## 2011-07-22 NOTE — Patient Instructions (Addendum)
I think you have a fungal infection on your hands. Use the Lotrimin cream on your hands twice a day for the next month.  I have increased the Oxybutynin to 3 times a day.  I have given you 90 pills for the month.    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  I'm not sure who I will be keeping yet.  I've enjoyed taking care of you!

## 2011-07-22 NOTE — Progress Notes (Signed)
Addended by: Jimmy Footman K on: 07/22/2011 05:50 PM   Modules accepted: Orders

## 2011-07-26 ENCOUNTER — Telehealth: Payer: Self-pay | Admitting: Family Medicine

## 2011-07-26 NOTE — Telephone Encounter (Signed)
Pt is wanting to speak to nurse

## 2011-07-29 ENCOUNTER — Telehealth: Payer: Self-pay | Admitting: Family Medicine

## 2011-07-29 NOTE — Telephone Encounter (Signed)
Pt is asking about the dental referral - he has teeth that have broken off and has been waiting a long time

## 2011-07-29 NOTE — Telephone Encounter (Signed)
Called pt and lvm telling him that due to the type of insurance that he has he will need to find a dentist and more than likely have to pay out of pocket for this. Laureen Ochs, Viann Shove

## 2011-08-03 ENCOUNTER — Ambulatory Visit (INDEPENDENT_AMBULATORY_CARE_PROVIDER_SITE_OTHER): Payer: Medicare HMO | Admitting: Family Medicine

## 2011-08-03 ENCOUNTER — Encounter: Payer: Self-pay | Admitting: Family Medicine

## 2011-08-03 VITALS — BP 100/62 | HR 76 | Temp 98.1°F | Ht 68.0 in | Wt 237.0 lb

## 2011-08-03 DIAGNOSIS — IMO0002 Reserved for concepts with insufficient information to code with codable children: Secondary | ICD-10-CM

## 2011-08-03 DIAGNOSIS — IMO0001 Reserved for inherently not codable concepts without codable children: Secondary | ICD-10-CM

## 2011-08-03 MED ORDER — KETOROLAC TROMETHAMINE 60 MG/2ML IM SOLN
60.0000 mg | Freq: Once | INTRAMUSCULAR | Status: AC
  Administered 2011-08-03: 60 mg via INTRAMUSCULAR

## 2011-08-04 ENCOUNTER — Other Ambulatory Visit: Payer: Self-pay | Admitting: *Deleted

## 2011-08-04 ENCOUNTER — Telehealth: Payer: Self-pay | Admitting: Family Medicine

## 2011-08-04 MED ORDER — DICLOFENAC SODIUM 75 MG PO TBEC: 75.0000 mg | DELAYED_RELEASE_TABLET | Freq: Two times a day (BID) | ORAL | Status: DC | PRN

## 2011-08-04 NOTE — Telephone Encounter (Signed)
He can take the Diclofenac with his Oxycodone.  We discussed this yesterday.

## 2011-08-04 NOTE — Assessment & Plan Note (Signed)
Toradol shot provided in clinic today, this was same that was provided last week. Diclofenac as NSAID on outpt basis since Toradol has helped so much. Refer to pain clinic as he has increasing opiate use.

## 2011-08-04 NOTE — Telephone Encounter (Signed)
Call patient back to inform him whether or not he can take the oxycodone with the new pain med prescribe.  Leave a msg on his machine because he won't be able to hear the phone ring.

## 2011-08-04 NOTE — Progress Notes (Signed)
  Subjective:    Patient ID: Patrick Holt, male    DOB: 1951/02/04, 61 y.o.   MRN: [id number]  HPI Brett Canales returns today to discuss his back pain:    1.  Back pain:  Persists.  He states that what has helped him the most in the past several years was "whatever shot it was that you gave me last time."  He states that he was able to go home and sleep through the night without pain for the first time in several years.  Still taking Oxycodone for relief in between, next refill not needed until end of July.  Feels Oxycodone is not helping with pain, desires referral to pain specialist.    As noted, does have chronic diarrhea.  Also with some chronic BL LE weakness.  Not surgical candidate per Ortho.  No saddle anesthesia.     Review of Systems See HPI above for review of systems.       Objective:   Physical Exam  Gen:  Alert, cooperative patient who appears stated age in no acute distress.  Vital signs reviewed. Back:  Tender to palpation BL lower extremities.  Patient ambulating with walker today in clinic.       Assessment & Plan:

## 2011-08-05 MED ORDER — DIPHENOXYLATE-ATROPINE 2.5-0.025 MG PO TABS: 1.0000 | ORAL_TABLET | Freq: Four times a day (QID) | ORAL | Status: DC | PRN

## 2011-08-05 MED ORDER — METOPROLOL TARTRATE 50 MG PO TABS: 50.0000 mg | ORAL_TABLET | Freq: Two times a day (BID) | ORAL | Status: DC

## 2011-08-05 NOTE — Telephone Encounter (Signed)
Called pt and lvm informing him that he can take the Diclofenac with the oxycodone.Loralee Pacas [place]

## 2011-08-06 ENCOUNTER — Telehealth: Payer: Self-pay | Admitting: Family Medicine

## 2011-08-06 NOTE — Telephone Encounter (Signed)
Message left on voicemail that RX has been sent to pharmacy. Looks like Dr. Gwendolyn Grant entered but "print" was selected because this cannot be sent electronically. Verbally gave RX to pharmacist.

## 2011-08-06 NOTE — Telephone Encounter (Signed)
Patient calling to inquire about getting refill for his diphenoxylate atropine for diarrhea.  Having a lot of loose stools and need rx sent to pharmacy asap.  On his way to pick up his other meds and would like to have this ready when picking up others

## 2011-08-09 ENCOUNTER — Telehealth: Payer: Self-pay | Admitting: Family Medicine

## 2011-08-09 NOTE — Telephone Encounter (Signed)
Patient is calling about his Referral to Pain Management because he still hasn't heard anything.  [facility], he would like someone to check on it.

## 2011-08-10 NOTE — Telephone Encounter (Signed)
Pt was informed that we are not in control of these appts and will have to wait for pain management to call him concerning his appt.Loralee Pacas [place]

## 2011-08-11 NOTE — Telephone Encounter (Signed)
Pt called pain management center - they told him that there was nothing they could do for him - wants to know what to do now.

## 2011-08-11 NOTE — Telephone Encounter (Signed)
I don't have any records of one's that he's been seen at before, I'm not sure that he has been.  He did state that he'd been seen at the "one on [address]  I don't know about any others.

## 2011-08-11 NOTE — Telephone Encounter (Signed)
It is true.  Thank you for the referral, but this patient is not appropriate for our clinic at [facility] is their exact words.  We can attempt another Pain Center.  Can you advise me of the ones he has already been seen at?  Ileana Ladd

## 2011-08-11 NOTE — Telephone Encounter (Signed)
Is this true?  I would suspect they would have a chance at helping someone with minimal narcotics and who is open to NSAIDs as an alternative for pain.  Is there any way to double check or attempt another pain clinic where he hasn't been seen before?  I will forward to Terese Door to ask her advice as well.

## 2011-08-12 ENCOUNTER — Telehealth: Payer: Self-pay | Admitting: Family Medicine

## 2011-08-12 NOTE — Telephone Encounter (Signed)
Patient is calling about the problem with the Referral and why they wouldn't take him as a patient.  He was hoping that he would get a return call about this yesterday but since that didn't happen he really wants to know something about what the next step will be, today.

## 2011-08-12 NOTE — Telephone Encounter (Signed)
New referral and records faxed to [facility] at [phone].  Ileana Ladd

## 2011-08-12 NOTE — Telephone Encounter (Signed)
Called lvm  informing pt that it will take about 1 week to see if we can get him into another pain clinic.Loralee Pacas [place]

## 2011-08-12 NOTE — Telephone Encounter (Signed)
We are trying another pain management clinic.  It may take a few days to hear back from them but when we do Patrick Holt can be sure that we will contact him.

## 2011-08-17 ENCOUNTER — Other Ambulatory Visit: Payer: Self-pay | Admitting: *Deleted

## 2011-08-18 MED ORDER — TORSEMIDE 10 MG PO TABS: 10.0000 mg | ORAL_TABLET | Freq: Every day | ORAL | Status: DC

## 2011-08-23 ENCOUNTER — Other Ambulatory Visit: Payer: Self-pay | Admitting: *Deleted

## 2011-08-23 MED ORDER — POTASSIUM CHLORIDE CRYS ER 20 MEQ PO TBCR: 20.0000 meq | EXTENDED_RELEASE_TABLET | Freq: Every day | ORAL | Status: DC

## 2011-09-06 ENCOUNTER — Telehealth: Payer: Self-pay | Admitting: Sports Medicine

## 2011-09-06 NOTE — Telephone Encounter (Signed)
He needs to go to Radiology dept at [facility] (or wherever he had MRI done) and request disc from them. We cannot do discs here as we don't have the films.

## 2011-09-06 NOTE — Telephone Encounter (Signed)
Patient informed that he will need to contact the facility that he had MRI performed and request a copy from them.  Patient states he was seen at Sports Med office in [facility] and that Dr. Gwendolyn Grant picked up patient's papers to make copies and never returned them.  Will inform Dr. Gwendolyn Grant.   Gaylene Brooks, RN

## 2011-09-06 NOTE — Telephone Encounter (Signed)
Need disc of MRI on patient's back and the paperwork left with it.  Need tomorrow when he comes in for his appt.  Need to take with him to the pain clinic.

## 2011-09-07 ENCOUNTER — Ambulatory Visit (INDEPENDENT_AMBULATORY_CARE_PROVIDER_SITE_OTHER): Payer: Medicare HMO | Admitting: Family Medicine

## 2011-09-07 ENCOUNTER — Encounter: Payer: Self-pay | Admitting: Family Medicine

## 2011-09-07 VITALS — BP 116/60 | HR 68 | Temp 98.7°F | Wt 221.0 lb

## 2011-09-07 DIAGNOSIS — E119 Type 2 diabetes mellitus without complications: Secondary | ICD-10-CM

## 2011-09-07 DIAGNOSIS — F411 Generalized anxiety disorder: Secondary | ICD-10-CM

## 2011-09-07 DIAGNOSIS — F329 Major depressive disorder, single episode, unspecified: Secondary | ICD-10-CM

## 2011-09-07 DIAGNOSIS — R52 Pain, unspecified: Secondary | ICD-10-CM

## 2011-09-07 DIAGNOSIS — E785 Hyperlipidemia, unspecified: Secondary | ICD-10-CM

## 2011-09-07 DIAGNOSIS — K589 Irritable bowel syndrome without diarrhea: Secondary | ICD-10-CM

## 2011-09-07 DIAGNOSIS — F17200 Nicotine dependence, unspecified, uncomplicated: Secondary | ICD-10-CM

## 2011-09-07 DIAGNOSIS — M3390 Dermatopolymyositis, unspecified, organ involvement unspecified: Secondary | ICD-10-CM

## 2011-09-07 MED ORDER — DICLOFENAC SODIUM 75 MG PO TBEC: 75.0000 mg | DELAYED_RELEASE_TABLET | Freq: Two times a day (BID) | ORAL | Status: DC | PRN

## 2011-09-07 MED ORDER — OXYCODONE HCL 5 MG PO TABS: 5.0000 mg | ORAL_TABLET | Freq: Three times a day (TID) | ORAL | Status: DC | PRN

## 2011-09-07 NOTE — Progress Notes (Signed)
Patient ID: Patrick Holt, male   DOB: May 07, 1950, 61 y.o.   MRN: [id number] Patrick Holt is a 61 y.o. male who presents to [facility] today for follow-up for his chronic medical problems:  1.  Back pain:  States pain is a "50" on scale of 1-10.  Diclofenac knocks this down to a "40."  Patient states he brought disc containing his MRI to clinic last visit last month.  He states he can no longer find this and thinks he may left it here.  I discussed no one has turned this in and if he needs to have this again he can pick up another copy at Radiology office at [facility].    Currently ambulating with walker.  Alternates between wheelchair and walker, although he reports that he does mow his own yard at home without either.  No saddle anesthesia.    The following portions of the patient's history were reviewed and updated as appropriate: allergies, current medications, past medical history, family and social history, and problem list.  Patient is a nonsmoker.   ROS as above otherwise neg. No Chest pain, palpitations, SOB, Fever, Chills, Abd pain, N/V/D.  Medications reviewed. Current Outpatient Prescriptions  Medication Sig Dispense Refill  . albuterol-ipratropium (COMBIVENT) 18-103 MCG/ACT inhaler Inhale 2 puffs into the lungs every 4 (four) hours as needed. For shortness of breath/wheeze       . ALPRAZolam (XANAX) 1 MG tablet Take 1 mg by mouth 3 (three) times daily as needed. Provided by Saul Fordyce MD      . aspirin (ASPIR-81) 81 MG EC tablet Take 81 mg by mouth daily.        . clotrimazole (LOTRIMIN) 1 % external solution Apply topically 2 (two) times daily.  30 mL  1  . diclofenac (VOLTAREN) 75 MG EC tablet Take 1 tablet (75 mg total) by mouth 2 (two) times daily as needed. For back pain  60 tablet  3  . diphenoxylate-atropine (LOMOTIL) 2.5-0.025 MG per tablet Take 1 tablet by mouth 4 (four) times daily as needed for diarrhea or loose stools.  90 tablet  1  . DULoxetine (CYMBALTA) 60 MG capsule  Take 60 mg by mouth daily. Provided by Saul Fordyce MD      . esomeprazole (NEXIUM) 40 MG capsule Take 1 capsule (40 mg total) by mouth daily.  30 capsule  4  . folic acid (FOLVITE) 1 MG tablet Take 1 mg by mouth as directed. One tab by mouth on days not taking methotrexate       . meclizine (ANTIVERT) 25 MG tablet Take 25 mg by mouth every 8 (eight) hours as needed. For vertigo       . methotrexate (RHEUMATREX) 2.5 MG tablet Take 3 tablets (7.5 mg total) by mouth once a week.  15 tablet  5  . metoprolol (LOPRESSOR) 50 MG tablet Take 1 tablet (50 mg total) by mouth 2 (two) times daily.  60 tablet  3  . mometasone (NASONEX) 50 MCG/ACT nasal spray 2 sprays by Nasal route 2 (two) times daily. 2 sprays in each nostril       . niacin (NIASPAN) 1000 MG CR tablet Take 1,000 mg by mouth at bedtime.        Marland Kitchen oxybutynin (DITROPAN) 5 MG tablet Take 1 tablet (5 mg total) by mouth 3 (three) times daily.  90 tablet  3  . oxyCODONE (OXY IR/ROXICODONE) 5 MG immediate release tablet Take 1 tablet (5 mg total) by mouth every  8 (eight) hours as needed for pain. Will fill q2 months.  180 tablet  0  . predniSONE (DELTASONE) 5 MG tablet Take 5 mg by mouth daily.        Marland Kitchen rOPINIRole (REQUIP) 2 MG tablet Take 2 mg by mouth 3 (three) times daily.        Marland Kitchen DISCONTD: oxyCODONE (OXY IR/ROXICODONE) 5 MG immediate release tablet Take 1 tablet (5 mg total) by mouth every 8 (eight) hours as needed for pain. Will fill q2 months.  180 tablet  0  . gabapentin (NEURONTIN) 600 MG tablet Take 1 tablet (600 mg total) by mouth 3 (three) times daily.  90 tablet  0  . lisinopril-hydrochlorothiazide (PRINZIDE,ZESTORETIC) 10-12.5 MG per tablet Take 1 tablet by mouth daily.  30 tablet  11  . nitroGLYCERIN (NITROSTAT) 0.4 MG SL tablet Place 0.4 mg under the tongue as needed.        . potassium chloride SA (K-DUR,KLOR-CON) 20 MEQ tablet Take 1 tablet (20 mEq total) by mouth daily.  30 tablet  0  . torsemide (DEMADEX) 10 MG tablet Take 1 tablet  (10 mg total) by mouth daily.  30 tablet  0  . DISCONTD: predniSONE (DELTASONE) 50 MG tablet Take 1 tablet (50 mg total) by mouth daily.  5 tablet  0    Exam:  BP 116/60  Pulse 68  Temp 98.7 F (37.1 C) (Oral)  Wt 221 lb (100.245 kg) Gen: Well-appearing, sitting in chair, walker present HEENT: EOMI,  MMM.  Very poor dental hygiene.  Lungs: CTABL Nl WOB Heart: RRR no MRG Abd: NABS, NT, ND MSK:  TTP BL lumbar region.  Decreased range of motion lumbar region secondary to pain Skin:  Dry scaling skin noted BL hand knuckles.  No other rash noted.  No heliotrope rash noted.  Exts: Non edematous BL  LE, warm and well perfused.   No results found for this or any previous visit (from the past 72 hour(s)).

## 2011-09-07 NOTE — Telephone Encounter (Signed)
He is coming in today for clinic.  The paperwork he provided me was from ENT and was a copy of ENT's original records.  He informed me that since it was a copy, we could keep it.  I still have the paperwork and can return it today.  However, there was nothing from his recent MRI or any previous Orthopedist notes in that paperwork.  He will still need to contact the Radiology Dept of wherever he had his MRI to receive those films.

## 2011-09-07 NOTE — Telephone Encounter (Signed)
Pt has appt today and can discuss any issues at that time.Patrick Holt

## 2011-09-08 ENCOUNTER — Encounter: Payer: Self-pay | Admitting: Family Medicine

## 2011-09-08 DIAGNOSIS — R52 Pain, unspecified: Secondary | ICD-10-CM

## 2011-09-08 MED ORDER — KETOROLAC TROMETHAMINE 60 MG/2ML IM SOLN
60.0000 mg | Freq: Once | INTRAMUSCULAR | Status: AC
  Administered 2011-09-08: 60 mg via INTRAMUSCULAR

## 2011-09-08 NOTE — Assessment & Plan Note (Signed)
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  Has rebound diarrhea when this is stopped.

## 2011-09-08 NOTE — Assessment & Plan Note (Addendum)
Provided him last dose of oxycodone today.   He is scheduled to see pain management in late August.  Oxycodone provided to get him through to that appointment.  Back pain currently is main complaint.

## 2011-09-08 NOTE — Assessment & Plan Note (Signed)
No signs of active current disease. Again brought up tapering off methotrexate and prednisone.  Last labwork has all been WNL. Recommended again that he needs rheumatologist -- his concern is transportation and lack of finances.   Ultimate goal would be to totally taper off medications and then have Rheum re-initiate if warranted.

## 2011-09-13 ENCOUNTER — Other Ambulatory Visit: Payer: Self-pay | Admitting: *Deleted

## 2011-09-13 MED ORDER — TORSEMIDE 10 MG PO TABS: 10.0000 mg | ORAL_TABLET | Freq: Every day | ORAL | Status: DC

## 2011-10-05 ENCOUNTER — Other Ambulatory Visit: Payer: Self-pay | Admitting: *Deleted

## 2011-10-07 ENCOUNTER — Other Ambulatory Visit: Payer: Self-pay | Admitting: Sports Medicine

## 2011-10-07 DIAGNOSIS — I10 Essential (primary) hypertension: Secondary | ICD-10-CM

## 2011-10-07 MED ORDER — DIPHENOXYLATE-ATROPINE 2.5-0.025 MG PO TABS: 1.0000 | ORAL_TABLET | Freq: Four times a day (QID) | ORAL | Status: DC | PRN

## 2011-10-07 MED ORDER — PREDNISONE 5 MG PO TABS: 5.0000 mg | ORAL_TABLET | Freq: Every day | ORAL | Status: DC

## 2011-10-07 MED ORDER — LISINOPRIL-HYDROCHLOROTHIAZIDE 10-12.5 MG PO TABS: 1.0000 | ORAL_TABLET | Freq: Every day | ORAL | Status: DC

## 2011-10-13 ENCOUNTER — Telehealth: Payer: Self-pay | Admitting: Clinical

## 2011-10-13 NOTE — Telephone Encounter (Signed)
Clinical Child psychotherapist (CSW) informed by hospital staff that pt called stating he has not had food in a few days, and has been waiting for a RN/CSW to go to his home however nobody has arrived. CSW contacted pt to explore pt living situation, concerns in order to be able to provide him with assistance. Pt stated he receives [phone] for disability however does not receive food stamps, pays for his bills and the maintenance of his truck. Pt stated that his ex-mother in law typically brings him food however she is financially having a difficult time as well. Pt stated his ex mother in law brought him a box of food yesterday so he has eaten however only has .90 in his account and will be unable to pay for [employer] transportation. CSW explored whether pt has applied for food stamps however pt stated the last time he applied in 2005/2011 he never received follow up information. CSW informed pt that she would explore what agency was to be following up with pt. And that CSW would explore resources for pt. Pt appreciative.  CSW contacted Anibal Henderson with [facility] and confirmed that [facility] has been trying to contact pt however have not been successful. CSW informed that [facility] CSW will contact pt today and assist with pt concerns.  Theresia Bough, MSW, Theresia Majors [phone]

## 2011-10-15 ENCOUNTER — Ambulatory Visit (INDEPENDENT_AMBULATORY_CARE_PROVIDER_SITE_OTHER): Payer: Medicare HMO | Admitting: Sports Medicine

## 2011-10-15 ENCOUNTER — Encounter: Payer: Self-pay | Admitting: Sports Medicine

## 2011-10-15 VITALS — BP 131/81 | HR 62 | Ht 68.0 in

## 2011-10-15 DIAGNOSIS — Z609 Problem related to social environment, unspecified: Secondary | ICD-10-CM

## 2011-10-15 DIAGNOSIS — E785 Hyperlipidemia, unspecified: Secondary | ICD-10-CM

## 2011-10-15 DIAGNOSIS — F17200 Nicotine dependence, unspecified, uncomplicated: Secondary | ICD-10-CM

## 2011-10-15 DIAGNOSIS — M3390 Dermatopolymyositis, unspecified, organ involvement unspecified: Secondary | ICD-10-CM

## 2011-10-15 DIAGNOSIS — R52 Pain, unspecified: Secondary | ICD-10-CM

## 2011-10-15 DIAGNOSIS — Z7289 Other problems related to lifestyle: Secondary | ICD-10-CM

## 2011-10-15 DIAGNOSIS — I10 Essential (primary) hypertension: Secondary | ICD-10-CM

## 2011-10-15 DIAGNOSIS — E119 Type 2 diabetes mellitus without complications: Secondary | ICD-10-CM

## 2011-10-15 MED ORDER — KETOROLAC TROMETHAMINE 60 MG/2ML IM SOLN: 60.0000 mg | Freq: Once | INTRAMUSCULAR | Status: DC

## 2011-10-15 MED ORDER — KETOROLAC TROMETHAMINE 60 MG/2ML IM SOLN
60.0000 mg | Freq: Once | INTRAMUSCULAR | Status: AC
  Administered 2011-10-15: 60 mg via INTRAMUSCULAR

## 2011-10-15 NOTE — Assessment & Plan Note (Signed)
Needs A1c rechecked at next visit. Obtaining food has been an issue for him.  Will avoid addition of hypoglycemics if possible

## 2011-10-15 NOTE — Assessment & Plan Note (Signed)
Stable/Well Controlled - no changes at this time. 

## 2011-10-15 NOTE — Patient Instructions (Addendum)
It was nice to meet you today.  YOU HAVE TO MAKE YOUR APPOINTMENT WITH THE PAIN DOCTORS ON SEPT 12.  I will not be continuing to write the medications for you.  Jewel Baize will be in touch with you.  Follow up in 3 weeks to further discuss your on going medical needs.

## 2011-10-15 NOTE — Assessment & Plan Note (Addendum)
Per OVERVIEW: "Patient states he was told by previous primary care physician that he is one of seven people in the entire country who cannot quit smoking because it will kill him.  Therefore he is not interested in stopping smoking. "

## 2011-10-15 NOTE — Assessment & Plan Note (Addendum)
Reinforced follow up with Pain management.  Was clearly stated to patient that we are not going to be continuing to write his chronic pain medicines due to complex pain issues  Toradol today

## 2011-10-15 NOTE — Assessment & Plan Note (Signed)
Needs CMET, CBC and CPK at next lab draw

## 2011-10-15 NOTE — Assessment & Plan Note (Addendum)
Appreciate Social Work's intervention.  Pt requesting help with transportation and food stamps Will refer to Berdie Ogren to address yearly Medicare Exam

## 2011-10-15 NOTE — Progress Notes (Signed)
  [facility]  Patient name: Patrick Holt MRN [id number]  Date of birth: [birth year]  CC & HPI:  Patrick Holt is a 61 y.o. male presenting today for injections for pain.  Today is my first day meeting Patrick Holt as his new PCP.    He reports that he had to reschedule his pain management meeting due to scheduling conflicts.  It is now scheduled for September 12.  Reports he has enough pain of his Oxycodone to last through 9/20.  Reports pain is once again 50 on a scale of 1-10.  Toradol injections during the last visits have helped.  Reiterates inability to quit smoking due to the fact he will die if he stops.  Reports psych issues followed by Dr. Saul Holt.  No acute decompensation.  ROS:  No worsening of his chronic symptoms.  No new loss of bowel or bladder. Does report difficulty with obtaining meals as well as difficulty with obtaining medications and transportation.  Pertinent History Reviewed:  Medical & Surgical Hx:  Reviewed: Significant for dermatomyositis, DM, Anxiety, Prior ischemic heart disease Medications: Reviewed & Updated - see associated section Social History: Reviewed - Significant for current everday smoker.  High risk social situation  Objective Findings:  Vitals:  Filed Vitals:   10/15/11 1428  BP: 131/81  Pulse: 62    PE: GENERAL:  Adult obese caucasian male in electric wheelchair.  Disheveled. In no discomfort; no respiratory distress. PSYCH: Alert.  Tangenital.  Anxious.  Stream of thought.  Insight:Lacking   H&N: AT/Loris, trachea midline EENT:  MMM, no scleral icterus, EOMi HEART: RRR, S1/S2 heard, no murmur LUNGS: CTA B, no wheezes, no crackles EXTREMITIES: Moves all 4 extremities spontaneously, warm well perfused, no edema, bilateral DP and PT pulses 2/4.  Hands with healing wound over palm of hand due to a fall.     Assessment & Plan:

## 2011-10-20 ENCOUNTER — Other Ambulatory Visit: Payer: Self-pay | Admitting: *Deleted

## 2011-10-21 MED ORDER — TORSEMIDE 10 MG PO TABS: 10.0000 mg | ORAL_TABLET | Freq: Every day | ORAL | Status: DC

## 2011-10-26 ENCOUNTER — Telehealth: Payer: Self-pay | Admitting: Sports Medicine

## 2011-10-26 NOTE — Telephone Encounter (Signed)
CSW has emailed [facility] provider Geralynn [facility] CSW Danford Bad with pt requests for a call regarding his concerns.  Theresia Bough, MSW, Theresia Majors [phone]

## 2011-10-26 NOTE — Telephone Encounter (Signed)
Clinical Child psychotherapist (CSW) received a call from [facility] CSW Maren Beach who was assigned to pt case through [facility]. CSW informed that pt has been receiving services through [facility] as well and that [facility] may have been the provider that recently spoke with pt. Ms. Lovell Sheehan informed CSW that she has contacted pt multiple times and attempted to provide pt with resources to address his needs however pt has been very resistant and will often times give a reason as to why the resource will not work/appropriate for pt. CSW informed that pt has received an application for the [facility] dental clinic and a DSS application for nutritional services as well as an offer to have [facility] CSW to facilitate with completing the applications however pt remains resistant. Pt has also been offered an aide to assist with pt needs at home however pt has stated he has appts and other obligations and therefore is not agreeable to an aide. [facility] CSW has also discussed ALF placement options for pt however pt became very irate and informed [facility] CSW that he is not agreeable to placement. Pt has been given necessary resources however it will be pt responsibility to accept assistance from providers. [facility] CSW will follow up with [facility] Caseworker to determine whether they have recently spoken to pt.   Theresia Bough, MSW, Theresia Majors [phone]

## 2011-10-26 NOTE — Telephone Encounter (Signed)
Patrick Holt calling to discuss last conversation with rep from [facility] to find out why it will take 6mos before he will be receiving any assistance.  [name] that someone from [facility] spoke with him and was very abrupt and hung up.  Please contact this patient to find exactly what is going on. May not have registered correct info from them.

## 2011-10-27 ENCOUNTER — Other Ambulatory Visit: Payer: Self-pay | Admitting: Sports Medicine

## 2011-10-27 NOTE — Telephone Encounter (Signed)
Spoke with [facility] @ [employer]. Patient signed for rx on 9/9 and paid a $3 fee to the pharmacy delivery service. So patient has received medicine. Concerning the pain medication, patient is going to [facility] for pain management and that will be handled by them and not [place].

## 2011-10-27 NOTE — Telephone Encounter (Signed)
Patient is calling because he says he needs a refill on his pain medication and Turesomide.  He says that his Pharmacy says they delivered them to his house but he doesn't recall anyone coming to his house.  He is very sluggish on the phone, confused and unable to say words correctly.  After I said I would get a message to his MD, all of a sudden he spoke normally.

## 2011-10-27 NOTE — Telephone Encounter (Signed)
Patient called back to see if [name] got the message and he also updated his cell #.

## 2011-10-28 NOTE — Telephone Encounter (Signed)
Spoke with patient and discussed the below. He does now know that he did receive his medication. We spoke about the pain medication and that he will be going to a pain clinic so we will not be handling those medications any more. Patient agreed to this.

## 2011-10-29 ENCOUNTER — Telehealth: Payer: Self-pay | Admitting: Sports Medicine

## 2011-10-29 NOTE — Telephone Encounter (Signed)
Patient is calling requesting to speak to Dr. Mauricio Po or Dr. Gwendolyn Grant about something confidential about the Pain Clinic he went to yesterday.

## 2011-11-01 NOTE — Telephone Encounter (Signed)
Patient informed that Dr. Gwendolyn Grant is no longer his PCP and that we would have to forward to PCP. Patient declined this and would not discuss further

## 2011-11-10 ENCOUNTER — Ambulatory Visit: Payer: Medicare HMO | Admitting: Home Health Services

## 2011-11-10 ENCOUNTER — Ambulatory Visit: Payer: Medicare HMO | Admitting: Sports Medicine

## 2011-11-12 ENCOUNTER — Emergency Department (HOSPITAL_COMMUNITY)
Admission: EM | Admit: 2011-11-12 | Discharge: 2011-11-12 | Disposition: A | Discharge status: Home / Self Care | Payer: Medicare HMO | Attending: Emergency Medicine | Admitting: Emergency Medicine

## 2011-11-12 ENCOUNTER — Emergency Department (HOSPITAL_COMMUNITY): Payer: Medicare HMO

## 2011-11-12 ENCOUNTER — Encounter (HOSPITAL_COMMUNITY): Payer: Self-pay | Admitting: Emergency Medicine

## 2011-11-12 DIAGNOSIS — M545 Low back pain, unspecified: Secondary | ICD-10-CM | POA: Insufficient documentation

## 2011-11-12 DIAGNOSIS — S0292XA Unspecified fracture of facial bones, initial encounter for closed fracture: Secondary | ICD-10-CM

## 2011-11-12 DIAGNOSIS — Y92410 Unspecified street and highway as the place of occurrence of the external cause: Secondary | ICD-10-CM | POA: Insufficient documentation

## 2011-11-12 DIAGNOSIS — I259 Chronic ischemic heart disease, unspecified: Secondary | ICD-10-CM | POA: Insufficient documentation

## 2011-11-12 DIAGNOSIS — S02109A Fracture of base of skull, unspecified side, initial encounter for closed fracture: Secondary | ICD-10-CM | POA: Insufficient documentation

## 2011-11-12 DIAGNOSIS — Z7982 Long term (current) use of aspirin: Secondary | ICD-10-CM | POA: Insufficient documentation

## 2011-11-12 DIAGNOSIS — S0180XA Unspecified open wound of other part of head, initial encounter: Secondary | ICD-10-CM | POA: Insufficient documentation

## 2011-11-12 DIAGNOSIS — S0181XA Laceration without foreign body of other part of head, initial encounter: Secondary | ICD-10-CM

## 2011-11-12 DIAGNOSIS — M542 Cervicalgia: Secondary | ICD-10-CM | POA: Insufficient documentation

## 2011-11-12 DIAGNOSIS — I10 Essential (primary) hypertension: Secondary | ICD-10-CM | POA: Insufficient documentation

## 2011-11-12 DIAGNOSIS — Z79899 Other long term (current) drug therapy: Secondary | ICD-10-CM | POA: Insufficient documentation

## 2011-11-12 DIAGNOSIS — E119 Type 2 diabetes mellitus without complications: Secondary | ICD-10-CM | POA: Insufficient documentation

## 2011-11-12 LAB — URINALYSIS, ROUTINE W REFLEX MICROSCOPIC
Bilirubin Urine: NEGATIVE
Hgb urine dipstick: NEGATIVE
Protein, ur: NEGATIVE mg/dL
Specific Gravity, Urine: 1.019 (ref 1.005–1.030)
pH: 7.5 (ref 5.0–8.0)

## 2011-11-12 LAB — COMPREHENSIVE METABOLIC PANEL
Alkaline Phosphatase: 63 U/L (ref 39–117)
BUN: 12 mg/dL (ref 6–23)
CO2: 31 mEq/L (ref 19–32)
Calcium: 9.3 mg/dL (ref 8.4–10.5)
GFR calc Af Amer: >90 mL/min (ref >90)
GFR calc non Af Amer: >90 mL/min (ref >90)
Glucose, Bld: 109 mg/dL — ABNORMAL HIGH (ref 70–99)
Total Protein: 6.9 g/dL (ref 6.0–8.3)

## 2011-11-12 LAB — URINE MICROSCOPIC-ADD ON

## 2011-11-12 LAB — CBC WITH DIFFERENTIAL/PLATELET
Eosinophils Absolute: 0.2 10*3/uL (ref 0.0–0.7)
Eosinophils Relative: 2 % (ref 0–5)
HCT: 45 % (ref 39.0–52.0)
Hemoglobin: 15.4 g/dL (ref 13.0–17.0)
Lymphocytes Relative: 33 % (ref 12–46)
Lymphs Abs: 2.8 10*3/uL (ref 0.7–4.0)
MCH: 36.9 pg — ABNORMAL HIGH (ref 26.0–34.0)
MCV: 107.9 fL — ABNORMAL HIGH (ref 78.0–100.0)
Monocytes Relative: 8 % (ref 3–12)
RBC: 4.17 MIL/uL — ABNORMAL LOW (ref 4.22–5.81)

## 2011-11-12 LAB — PROTIME-INR
INR: 0.95 (ref 0.00–1.49)
Prothrombin Time: 12.6 seconds (ref 11.6–15.2)

## 2011-11-12 LAB — POCT I-STAT TROPONIN I

## 2011-11-12 MED ORDER — METOPROLOL TARTRATE 1 MG/ML IV SOLN: 20.0000 mg | INTRAVENOUS | Status: DC | PRN

## 2011-11-12 MED ORDER — METOPROLOL TARTRATE 1 MG/ML IV SOLN
10.0000 mg | Freq: Once | INTRAVENOUS | Status: AC
  Administered 2011-11-12: 10 mg via INTRAVENOUS
  Filled 2011-11-12: qty 10

## 2011-11-12 MED ORDER — LIDOCAINE-EPINEPHRINE 1 %-1:100000 IJ SOLN: 10.0000 mL | Freq: Once | INTRAMUSCULAR | Status: DC

## 2011-11-12 MED ORDER — FENTANYL CITRATE 0.05 MG/ML IJ SOLN
100.0000 ug | Freq: Once | INTRAMUSCULAR | Status: AC
  Administered 2011-11-12: 100 ug via INTRAVENOUS
  Filled 2011-11-12: qty 2

## 2011-11-12 NOTE — ED Notes (Addendum)
MD in room suturing forehead. Xylocaine 2% used from suture cart

## 2011-11-12 NOTE — ED Notes (Signed)
i-stat troponin results=.00 
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
## 2011-11-12 NOTE — ED Notes (Signed)
SW at bedside arranging cab ride home

## 2011-11-12 NOTE — ED Notes (Signed)
Pt back from XR and CT. On monitor

## 2011-11-12 NOTE — ED Notes (Signed)
Pt picking at nose and lip causing cut areas to bleed.

## 2011-11-12 NOTE — ED Notes (Signed)
CSW provided pt with taxi voucher. 

## 2011-11-12 NOTE — ED Notes (Signed)
Pt to XR and CT on monitor with RN

## 2011-11-22 ENCOUNTER — Emergency Department (HOSPITAL_COMMUNITY): Payer: Medicare HMO

## 2011-11-22 ENCOUNTER — Other Ambulatory Visit: Payer: Self-pay

## 2011-11-22 ENCOUNTER — Encounter (HOSPITAL_COMMUNITY): Payer: Self-pay | Admitting: *Deleted

## 2011-11-22 ENCOUNTER — Emergency Department (HOSPITAL_COMMUNITY)
Admission: EM | Admit: 2011-11-22 | Discharge: 2011-11-22 | Disposition: A | Discharge status: Home / Self Care | Payer: Medicare HMO | Attending: Emergency Medicine | Admitting: Emergency Medicine

## 2011-11-22 DIAGNOSIS — S0093XA Contusion of unspecified part of head, initial encounter: Secondary | ICD-10-CM

## 2011-11-22 DIAGNOSIS — S0003XA Contusion of scalp, initial encounter: Secondary | ICD-10-CM | POA: Insufficient documentation

## 2011-11-22 DIAGNOSIS — M542 Cervicalgia: Secondary | ICD-10-CM | POA: Insufficient documentation

## 2011-11-22 DIAGNOSIS — Z79899 Other long term (current) drug therapy: Secondary | ICD-10-CM | POA: Insufficient documentation

## 2011-11-22 DIAGNOSIS — S0280XA Fracture of other specified skull and facial bones, unspecified side, initial encounter for closed fracture: Secondary | ICD-10-CM | POA: Insufficient documentation

## 2011-11-22 DIAGNOSIS — I10 Essential (primary) hypertension: Secondary | ICD-10-CM | POA: Insufficient documentation

## 2011-11-22 DIAGNOSIS — R42 Dizziness and giddiness: Secondary | ICD-10-CM | POA: Insufficient documentation

## 2011-11-22 DIAGNOSIS — E119 Type 2 diabetes mellitus without complications: Secondary | ICD-10-CM | POA: Insufficient documentation

## 2011-11-22 DIAGNOSIS — W19XXXA Unspecified fall, initial encounter: Secondary | ICD-10-CM | POA: Insufficient documentation

## 2011-11-22 DIAGNOSIS — I252 Old myocardial infarction: Secondary | ICD-10-CM | POA: Insufficient documentation

## 2011-11-22 DIAGNOSIS — S0291XA Unspecified fracture of skull, initial encounter for closed fracture: Secondary | ICD-10-CM

## 2011-11-22 LAB — URINALYSIS, ROUTINE W REFLEX MICROSCOPIC
Bilirubin Urine: NEGATIVE
Hgb urine dipstick: NEGATIVE
Nitrite: NEGATIVE
Specific Gravity, Urine: 1.014 (ref 1.005–1.030)
pH: 5 (ref 5.0–8.0)

## 2011-11-22 LAB — CBC WITH DIFFERENTIAL/PLATELET
Hemoglobin: 14.4 g/dL (ref 13.0–17.0)
Lymphs Abs: 2.3 10*3/uL (ref 0.7–4.0)
Monocytes Relative: 8 % (ref 3–12)
Neutro Abs: 4.3 10*3/uL (ref 1.7–7.7)
Neutrophils Relative %: 59 % (ref 43–77)
RBC: 4.03 MIL/uL — ABNORMAL LOW (ref 4.22–5.81)
WBC: 7.3 10*3/uL (ref 4.0–10.5)

## 2011-11-22 LAB — BASIC METABOLIC PANEL
BUN: 20 mg/dL (ref 6–23)
Chloride: 101 mEq/L (ref 96–112)
GFR calc Af Amer: 72 mL/min — ABNORMAL LOW (ref >90)
Glucose, Bld: 106 mg/dL — ABNORMAL HIGH (ref 70–99)
Potassium: 3 mEq/L — ABNORMAL LOW (ref 3.5–5.1)

## 2011-11-22 LAB — RAPID URINE DRUG SCREEN, HOSP PERFORMED
Cocaine: NOT DETECTED
Opiates: NOT DETECTED
Tetrahydrocannabinol: NOT DETECTED

## 2011-11-22 LAB — ETHANOL: Alcohol, Ethyl (B): <11 mg/dL (ref 0–11)

## 2011-11-22 MED ORDER — SODIUM CHLORIDE 0.9 % IV BOLUS (SEPSIS)
1000.0000 mL | Freq: Once | INTRAVENOUS | Status: AC
  Administered 2011-11-22: 1000 mL via INTRAVENOUS

## 2011-11-22 NOTE — ED Notes (Signed)
Patient given discharge instructions, information, prescriptions, and diet order. Patient states that they adequately understand discharge information given and to return to ED if symptoms return or worsen.     

## 2011-11-22 NOTE — ED Notes (Signed)
Wound cleaned with NS and applied to xeroform dsg per Dr. Patsey Berthold instructions.  Sutures removed as well as VO by Dr. Adriana Simas

## 2011-11-22 NOTE — ED Notes (Signed)
Patrick Holt Sec calling pt's ENT Patrick Holt per his request to cancel his appt this afternoon.

## 2011-11-22 NOTE — ED Notes (Signed)
MD at bedside.(Cook) 

## 2011-11-22 NOTE — ED Notes (Signed)
Patient transported to CT 

## 2011-11-22 NOTE — ED Provider Notes (Signed)
History     CSN: [id number]  Arrival date & time 11/22/11  0820   First MD Initiated Contact with Patient 11/22/11 [phone]      Chief Complaint  Patient presents with  . Alcohol Problem    (Consider location/radiation/quality/duration/timing/severity/associated sxs/prior treatment) HPI.... accidental fall this morning.  Complains of abrasion to left forehead.  Level V caveat for urgent need for intervention.  No chest pain or shortness of breath.  Severity is mild to moderate.  On spine board  Past Medical History  Diagnosis Date  . Rheumatoid arthritis   . Obesity   . Major depression     with acute psychotic break in 06/2010  . Diabetes mellitus   . Hypertension   . Hyperlipidemia   . Diverticulosis of colon   . COPD (chronic obstructive pulmonary disease)   . Anxiety   . GERD (gastroesophageal reflux disease)   . Vertigo   . Seizures   . Fibromyalgia   . Dermatomyositis   . Myocardial infarct     mulitple (1999, 2000, 2004)  . Raynaud's disease   . Narcotic dependence   . Peripheral neuropathy   . Internal hemorrhoids   . Ischemic heart disease   . Hiatal hernia   . Gastritis   . Diverticulitis   . Hx of adenomatous colonic polyps   . Nephrolithiasis   . Anemia   . Esophageal stricture   . Esophageal dysmotility   . Dermatomyositis   . Paroxysmal a-fib   . Urge incontinence   . Otosclerosis     Past Surgical History  Procedure Date  . Knee arthroscopy w/ meniscal repair 2009    left  . Lumbar disc surgery   . Squamous papilloma  2010    removed by Dr. Pollyann Kennedy ENT, noted on tongue    Family History  Problem Relation Age of Onset  . Alcohol abuse Mother   . Depression Mother   . Heart disease Mother   . Diabetes Mother   . Stroke Mother   . Diabetes Other     1/2 brother    History  Substance Use Topics  . Smoking status: Current Every Day Smoker -- 0.5 packs/day  . Smokeless tobacco: Not on file  . Alcohol Use: 0.6 oz/week    1 Glasses of  wine per week     daily      Review of Systems  Unable to perform ROS: Other    Allergies  Betamethasone dipropionate; Bupropion hcl; Ciprofloxacin; Clobetasol; Codeine; Escitalopram oxalate; Fluoxetine hcl; Fluticasone-salmeterol; Furosemide; Immune globulins; Metronidazole; Paroxetine; Penicillins; Statins; Tacrolimus; Tetanus toxoid; and Tuberculin purified protein derivative  Home Medications   Current Outpatient Rx  Name Route Sig Dispense Refill  . IPRATROPIUM-ALBUTEROL 18-103 MCG/ACT IN AERO Inhalation Inhale 2 puffs into the lungs every 4 (four) hours as needed. For shortness of breath/wheeze     . ALPRAZOLAM 1 MG PO TABS Oral Take 1 mg by mouth 3 (three) times daily as needed. For anxiety    . ASPIRIN 81 MG PO TBEC Oral Take 81 mg by mouth daily.      Marland Kitchen DICLOFENAC SODIUM 75 MG PO TBEC Oral Take 1 tablet (75 mg total) by mouth 2 (two) times daily as needed. For back pain 60 tablet 3  . DIPHENOXYLATE-ATROPINE 2.5-0.025 MG PO TABS Oral Take 1 tablet by mouth 4 (four) times daily as needed for diarrhea or loose stools. 90 tablet 5  . DULOXETINE HCL 60 MG PO CPEP Oral Take 60 mg  by mouth 2 (two) times daily.     Marland Kitchen FOLIC ACID 1 MG PO TABS Oral Take 1 mg by mouth as directed. One tab by mouth on days not taking methotrexate     . LISINOPRIL-HYDROCHLOROTHIAZIDE 10-12.5 MG PO TABS Oral Take 1 tablet by mouth daily. 30 tablet 11  . METHOTREXATE 2.5 MG PO TABS Oral Take 3 tablets (7.5 mg total) by mouth once a week. 15 tablet 5  . METOPROLOL TARTRATE 50 MG PO TABS Oral Take 1 tablet (50 mg total) by mouth 2 (two) times daily. 60 tablet 3  . OXYBUTYNIN CHLORIDE 5 MG PO TABS Oral Take 1 tablet (5 mg total) by mouth 3 (three) times daily. 90 tablet 3  . POTASSIUM CHLORIDE CRYS ER 20 MEQ PO TBCR Oral Take 1 tablet (20 mEq total) by mouth daily. 30 tablet 0    Make office visit within one month for check.  Marland Kitchen PREDNISONE 5 MG PO TABS Oral Take 1 tablet (5 mg total) by mouth daily. 30 tablet 5    . ROPINIROLE HCL 2 MG PO TABS Oral Take 2 mg by mouth 3 (three) times daily.      . TORSEMIDE 10 MG PO TABS Oral Take 1 tablet (10 mg total) by mouth daily. 30 tablet 0    Needs doctor visit prior to refills  . GABAPENTIN 600 MG PO TABS Oral Take 1 tablet (600 mg total) by mouth 3 (three) times daily. 90 tablet 0  . NITROGLYCERIN 0.4 MG SL SUBL Sublingual Place 0.4 mg under the tongue every 5 (five) minutes as needed. For chest pain      BP 144/84  Pulse 60  Temp 98 F (36.7 C) (Oral)  Resp 17  SpO2 100%  Physical Exam  Nursing note and vitals reviewed. Constitutional: He is oriented to person, place, and time. He appears well-developed and well-nourished.  HENT:  Head: Normocephalic.       Left frontal scalp:  Abrasion superimposed upon old sutures   Eyes: Conjunctivae normal and EOM are normal. Pupils are equal, round, and reactive to light.  Neck: Normal range of motion. Neck supple.  Cardiovascular: Normal rate, regular rhythm and normal heart sounds.   Pulmonary/Chest: Effort normal and breath sounds normal.  Abdominal: Soft. Bowel sounds are normal.  Musculoskeletal: Normal range of motion.  Neurological: He is alert and oriented to person, place, and time.  Skin: Skin is warm and dry.  Psychiatric: He has a normal mood and affect.    ED Course  Procedures (including critical care time)  Labs Reviewed  CBC WITH DIFFERENTIAL - Abnormal; Notable for the following:    RBC 4.03 (*)     MCV 107.4 (*)     MCH 35.7 (*)     All other components within normal limits  BASIC METABOLIC PANEL - Abnormal; Notable for the following:    Potassium 3.0 (*)     Glucose, Bld 106 (*)     GFR calc non Af Amer 62 (*)     GFR calc Af Amer 72 (*)     All other components within normal limits  URINALYSIS, ROUTINE W REFLEX MICROSCOPIC - Abnormal; Notable for the following:    APPearance CLOUDY (*)     All other components within normal limits  URINE RAPID DRUG SCREEN (HOSP PERFORMED) -  Abnormal; Notable for the following:    Benzodiazepines POSITIVE (*)     All other components within normal limits  ETHANOL   Ct  Head Wo Contrast  11/22/2011  *RADIOLOGY REPORT*  Clinical Data:  Vertigo, dizziness, fall striking head, neck pain  CT HEAD WITHOUT CONTRAST CT CERVICAL SPINE WITHOUT CONTRAST  Technique:  Multidetector CT imaging of the head and cervical spine was performed following the standard protocol without intravenous contrast.  Multiplanar CT image reconstructions of the cervical spine were also generated.  Comparison:  11/12/2011  CT HEAD  Findings: Generalized atrophy. Normal ventricular morphology. No midline shift or mass effect. Small vessel chronic ischemic changes of deep cerebral white matter. No intracranial hemorrhage, mass lesion or evidence of acute infarction. No extra-axial fluid collections. Small left frontal scalp hematoma. Decreased opacification of the right maxillary sinus since previous exam, with sinus incompletely visualized. Bones appear demineralized. Extensive atherosclerotic calcification within the internal carotid arteries bilaterally.  Nondisplaced left frontal bone fracture extending through the superior wall of the left orbit, in retrospect unchanged.  IMPRESSION: Atrophy with small vessel chronic ischemic changes of deep cerebral white matter. No acute intracranial abnormalities. Nondisplaced left frontal bone fracture.  CT CERVICAL SPINE  Findings: Visualized skull base intact. Scattered atherosclerotic calcifications. Lung apices clear. Old ununited fracture of the spinous process of the T1. Prevertebral soft tissues normal thickness. Vertebral body and disc space heights maintained. No acute fracture, subluxation, or bone destruction.  IMPRESSION: No acute cervical spine abnormalities.   Original Report Authenticated By: Lollie Marrow, M.D.    Ct Cervical Spine Wo Contrast  11/22/2011  *RADIOLOGY REPORT*  Clinical Data:  Vertigo, dizziness, fall  striking head, neck pain  CT HEAD WITHOUT CONTRAST CT CERVICAL SPINE WITHOUT CONTRAST  Technique:  Multidetector CT imaging of the head and cervical spine was performed following the standard protocol without intravenous contrast.  Multiplanar CT image reconstructions of the cervical spine were also generated.  Comparison:  11/12/2011  CT HEAD  Findings: Generalized atrophy. Normal ventricular morphology. No midline shift or mass effect. Small vessel chronic ischemic changes of deep cerebral white matter. No intracranial hemorrhage, mass lesion or evidence of acute infarction. No extra-axial fluid collections. Small left frontal scalp hematoma. Decreased opacification of the right maxillary sinus since previous exam, with sinus incompletely visualized. Bones appear demineralized. Extensive atherosclerotic calcification within the internal carotid arteries bilaterally.  Nondisplaced left frontal bone fracture extending through the superior wall of the left orbit, in retrospect unchanged.  IMPRESSION: Atrophy with small vessel chronic ischemic changes of deep cerebral white matter. No acute intracranial abnormalities. Nondisplaced left frontal bone fracture.  CT CERVICAL SPINE  Findings: Visualized skull base intact. Scattered atherosclerotic calcifications. Lung apices clear. Old ununited fracture of the spinous process of the T1. Prevertebral soft tissues normal thickness. Vertebral body and disc space heights maintained. No acute fracture, subluxation, or bone destruction.  IMPRESSION: No acute cervical spine abnormalities.   Original Report Authenticated By: Lollie Marrow, M.D.      1. Fall   2. Non-depressed skull fracture   3. Contusion of head       MDM  Nondisplaced left frontal bone fracture noted in retrospect which is unchanged from previous film.  Patient is ambulatory at discharge. No neuro deficits. He is alert and oriented x3.  Follow up per Dr. Pollyann Kennedy for  fracture care        Donnetta Hutching, MD 11/22/11 [phone]

## 2011-11-22 NOTE — ED Notes (Signed)
Pt states he is ready to leave, [facility] EDP notified.  Wants pt ambulated.  Pt ambulated with steady gait.  Dr. Adriana Simas notified.  Pt states that he is ready to leave, states "give me a pass and i will get out of here."  Pt made aware that he is not discharged as of yet.  Dr. Adriana Simas made aware.  Pt notified that EDP will be in to speak to him.  Pt is yelling, states he wants to leave.  Pt states that he had money, but does not remember where he kept it.  Pt has an orange bag with his wallet in it.  Pt's belonging bag with his pants handed to pt, pt's money was in his pants.  Pt yelled out states that he wants to leave, pt states "where is that damn doctor?" Pt asked not to yell at staff and that we are trying to help him.

## 2011-11-22 NOTE — Progress Notes (Signed)
CSW received referral for pt transportation assistance. Pt unable to be transported by bus due to physical limitations, csw received approval for cab voucher. CSW provided pt with cab voucher ticket and scheduled pick up. .No further Clinical Social Work needs, signing off.   Catha Gosselin, LCSWA  [phone] .11/22/2011 1559pm

## 2011-11-22 NOTE — ED Notes (Signed)
Spoke to [employer] re pt requesting cab voucher.  Will call back

## 2011-11-22 NOTE — ED Notes (Signed)
Pt is also refusing to lay back down, pt is sitting on the side of the stretcher.
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
## 2011-11-22 NOTE — ED Notes (Signed)
Per EMS, was called by pt's neighbor, pt was found laying on his front porch.  Lac above L eye noted per EMS.  ETOH on board.

## 2011-11-22 NOTE — ED Notes (Signed)
Social worked contacted for patient. Pt requesting cab voucher. ETA is approximately 30 minutes.

## 2011-12-08 ENCOUNTER — Other Ambulatory Visit: Payer: Self-pay | Admitting: Cardiology

## 2011-12-08 ENCOUNTER — Telehealth: Payer: Self-pay | Admitting: Internal Medicine

## 2011-12-08 DIAGNOSIS — E785 Hyperlipidemia, unspecified: Secondary | ICD-10-CM

## 2011-12-08 MED ORDER — ESOMEPRAZOLE MAGNESIUM 40 MG PO CPDR: 40.0000 mg | DELAYED_RELEASE_CAPSULE | Freq: Every day | ORAL | Status: DC

## 2011-12-08 MED ORDER — HYDROCORTISONE ACETATE 25 MG RE SUPP: 25.0000 mg | Freq: Every day | RECTAL | Status: DC

## 2011-12-08 MED ORDER — METOPROLOL TARTRATE 50 MG PO TABS: 50.0000 mg | ORAL_TABLET | Freq: Two times a day (BID) | ORAL | Status: DC

## 2011-12-08 MED ORDER — DICYCLOMINE HCL 20 MG PO TABS: 20.0000 mg | ORAL_TABLET | Freq: Two times a day (BID) | ORAL | Status: DC

## 2011-12-08 NOTE — Addendum Note (Signed)
Addended by: Gaspar Bidding D on: 12/08/2011 08:41 PM   Modules accepted: Orders

## 2011-12-08 NOTE — Telephone Encounter (Signed)
Pt calling b/c he has run out of his medications prescribed by his primary care [facility]). Pt states he was a pt of Dr. Daleen Squibb &  May have seen him in the hospital last year. Pt ensued to give a complete, to the best of his knowledge, history of his recent & past illnesses & hospitalizations.  After checking EMR & EPIC  I could not find where pt had seen Dr. Daleen Squibb in the office. I did offer him a first available appt with our cardiologists here.  I did tell that I could not call in any refills for a pt we had not seen & had not prescribed these medications too.  Recommended he contact his pcp. He says he tried but they will not call him back  Pt became verbally belligerant and hung up after stating that I was responsible if he died b/c I would not refill his medications then hung up.  I have tried to contact [facility] but they are at lunch until 1:30. I will call back after regarding pt and his medications.

## 2011-12-08 NOTE — Telephone Encounter (Signed)
plz return call to pt regarding metoprolol med.  Pt is totally Rx was written by a previous Dr. Claiborne Billings is no longer practicing.  Pt can be reached at [phone].

## 2011-12-08 NOTE — Telephone Encounter (Signed)
Patient calls today requesting refills of Nexium and Lomotil (we have not given him Lomotil in quite some time). I called [facility] because patient stated that they had no lomotil prescription on file for him even though Dr Berline Chough sent multiple refills recently. [facility] does have multiple refills available of Lomotil, therefore, they will go forward with filling the Rx. I will send Nexium. Patient states that he is having diarrhea with some blood as well as multiple episodes of fecal incontinence and urgency. He also has occasional sharp lower abdominal pain that varies in location from LLQ to RLQ. No objective fever. At patient's last office visit with [place], he was having rectal bleeding which was associated with constipation and straining (has hx of IBS), therefore he was given Miralax to take. I did ask if he was still taking this and he assured me that he was not taking it any longer. Dr Juanda Chance, any other suggestions?

## 2011-12-08 NOTE — Telephone Encounter (Signed)
I was able to speak with someone, ?Para March, at [facility] about pt medications.  He does have refills on Lisinopril-HCTZ but apparently had no refills on his metoprolol.   Reviewed meds with her. They will call pt back about his refills. Mylo Red RN

## 2011-12-08 NOTE — Telephone Encounter (Signed)
I have happily refilled his metoprolol.  However as I discussed with him at our one visit (and had Patrick Holt during his last visits) [facility] will no longer be writing him Oxycodone.  There was evidently a problem with his pain management appointment that he was not willing to discuss with anybody but Patrick Holt who is no longer his PCP.  Unfortunately, Patrick Holt will need to be seen (as he would for a month refill any ways) if he needs to further discuss his ongoing chronic pain.

## 2011-12-08 NOTE — Telephone Encounter (Signed)
Patient advised to begin Bentyl twice daily and Anusol suppositories qhs x 12 nights. He will also continue Lomotil. Patient verbalizes understanding.

## 2011-12-08 NOTE — Telephone Encounter (Signed)
OK to refill Lomotil and nexiem

## 2011-12-08 NOTE — Telephone Encounter (Signed)
He has a hx of IBS, last colon 1//2010, adenomatous polyp ( also 2005), please start Bentyl 20 mg po bid, #60, 1 refill and Anusol HC supp #12, 1 hs, 1 refill

## 2011-12-08 NOTE — Telephone Encounter (Addendum)
Called patient to get more info.  Patient states "by law y'all have to fill my medicines for 30 days."  Requesting refill of metoprolol and oxycodone.  Does not have transportation and asks if we can drop his oxycodone Rx off at his pharmacy. Informed patient we will not be able to take his Rx to pharmacy---he can send friend or family member to pick up Rx if it is approved.  Patient will be transferring to practice on [address] and [address], but does not remember the provider's name.  Will send request to [name] and call patient back.  Gaylene Brooks, RN

## 2011-12-08 NOTE — Telephone Encounter (Signed)
LMTCB Debbie Monique Gift RN  

## 2011-12-10 MED ORDER — METHOTREXATE 2.5 MG PO TABS: 7.5000 mg | ORAL_TABLET | ORAL | Status: DC

## 2011-12-10 MED ORDER — TORSEMIDE 10 MG PO TABS: 10.0000 mg | ORAL_TABLET | Freq: Every day | ORAL | Status: DC

## 2011-12-10 MED ORDER — OXYBUTYNIN CHLORIDE 5 MG PO TABS: 5.0000 mg | ORAL_TABLET | Freq: Three times a day (TID) | ORAL | Status: DC

## 2011-12-10 NOTE — Telephone Encounter (Signed)
Refills for methotrexate, metoprolol and torsemide with zero refills.  We will no longer be refilling his medications unless returns to clinic

## 2011-12-10 NOTE — Addendum Note (Signed)
Addended by: Gaspar Bidding D on: 12/10/2011 05:25 PM   Modules accepted: Orders

## 2011-12-10 NOTE — Telephone Encounter (Signed)
Returned call to patient and informed of message from [name] that office visit required for pain med refill.  Patient states he only needs refills of Torsemide, methotrexate, and Oxybutynin sent to [facility] because they will deliver his meds.  Informed that forms are on Dr. Janeece Riggers desk to be completed and faxed after he finishes his afternoon clinic.  Gaylene Brooks, RN

## 2012-01-10 IMAGING — CR DG CHEST 1V PORT
1 series · 1 of 1 positions shown · non-contrast
Comparison: PA and lateral chest 07/15/2009 at [DATE] a.m.

CLINICAL DATA: Line placement.

PORTABLE CHEST - 1 VIEW

[AP]
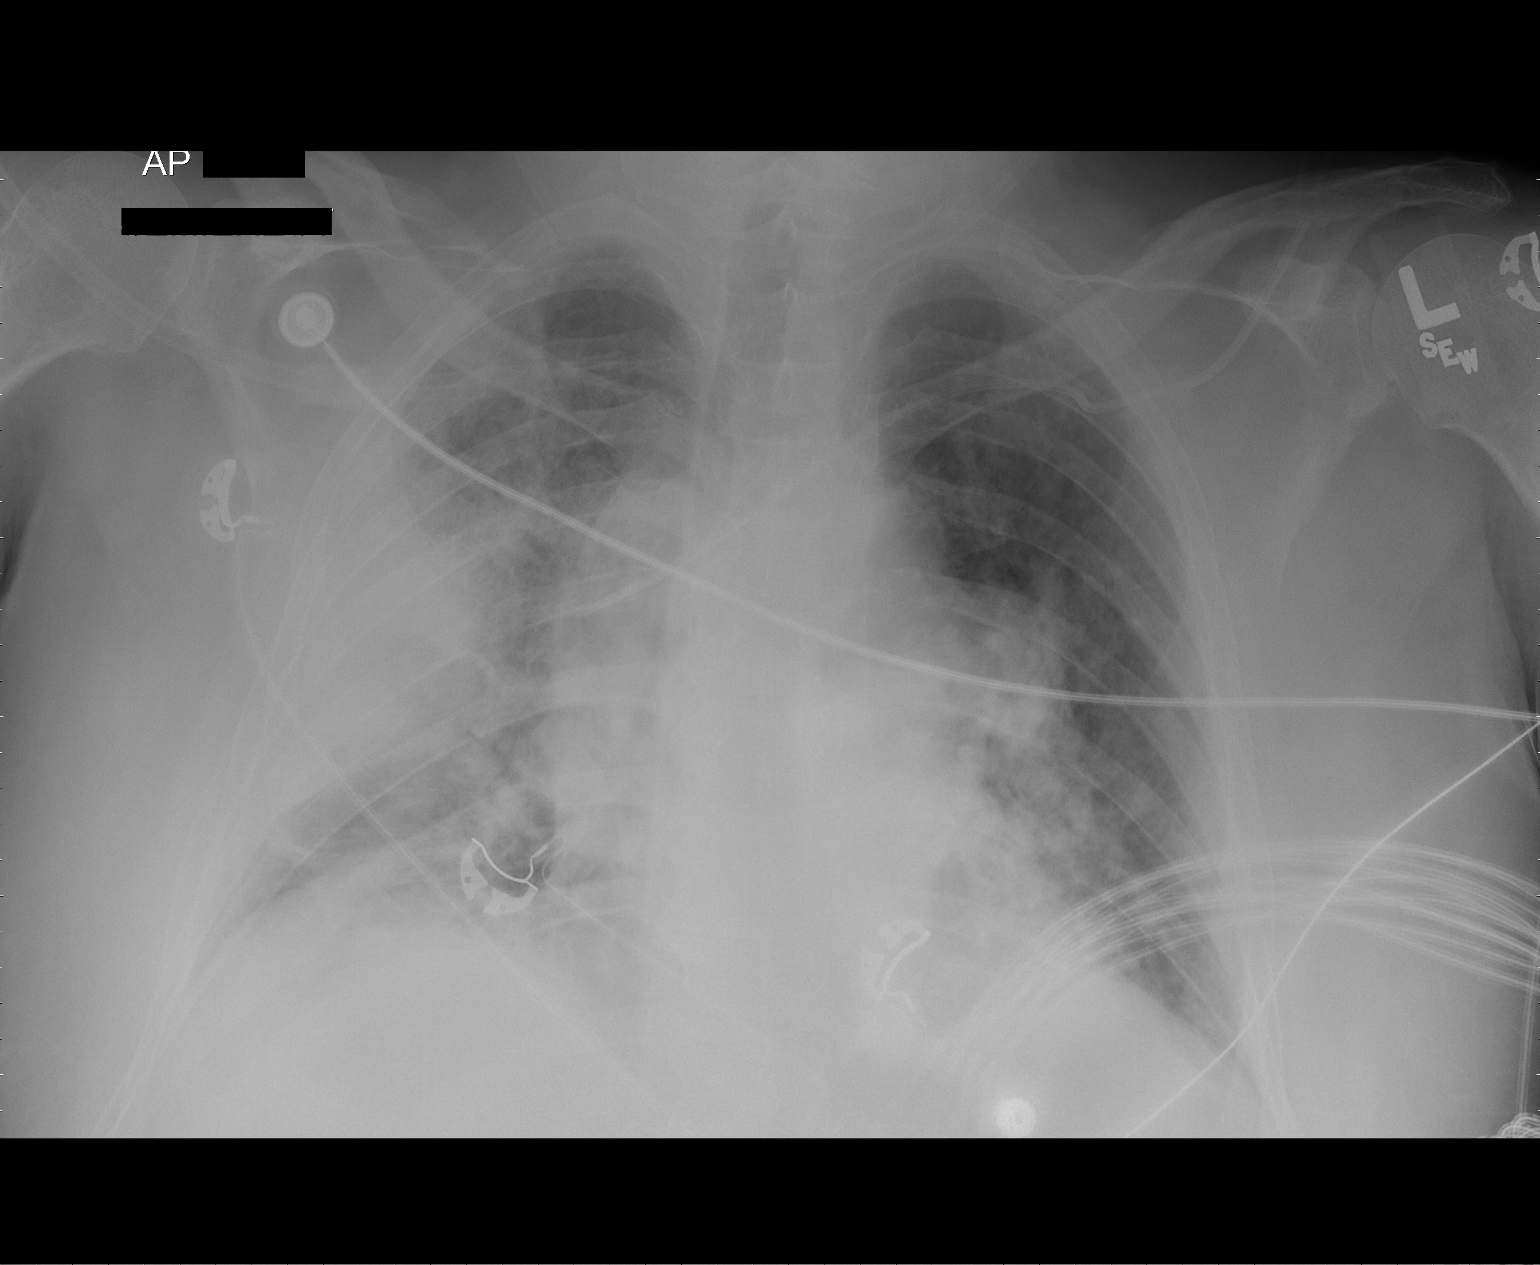

[1 of 1 positions shown; findings below may reference images not displayed]

FINDINGS: The patient has a new left subclavian central venous
catheter with the tip in the superior vena cava.  Right upper lobe
airspace disease shows some worsening.  There has also been some
increase in patchy left basilar airspace disease.  No pneumothorax.
Heart size normal.
IMPRESSION: 1.  Left subclavian catheter in good position.  No pneumothorax.
2.  Right worse than left airspace disease shows some progression.

## 2012-01-10 IMAGING — CR DG CHEST 2V
1 series · 1 of 1 positions shown · non-contrast
Comparison: Plain film of the chest 12/29/2005.

CLINICAL DATA: Fever.

CHEST - 2 VIEW

[w chest lat]
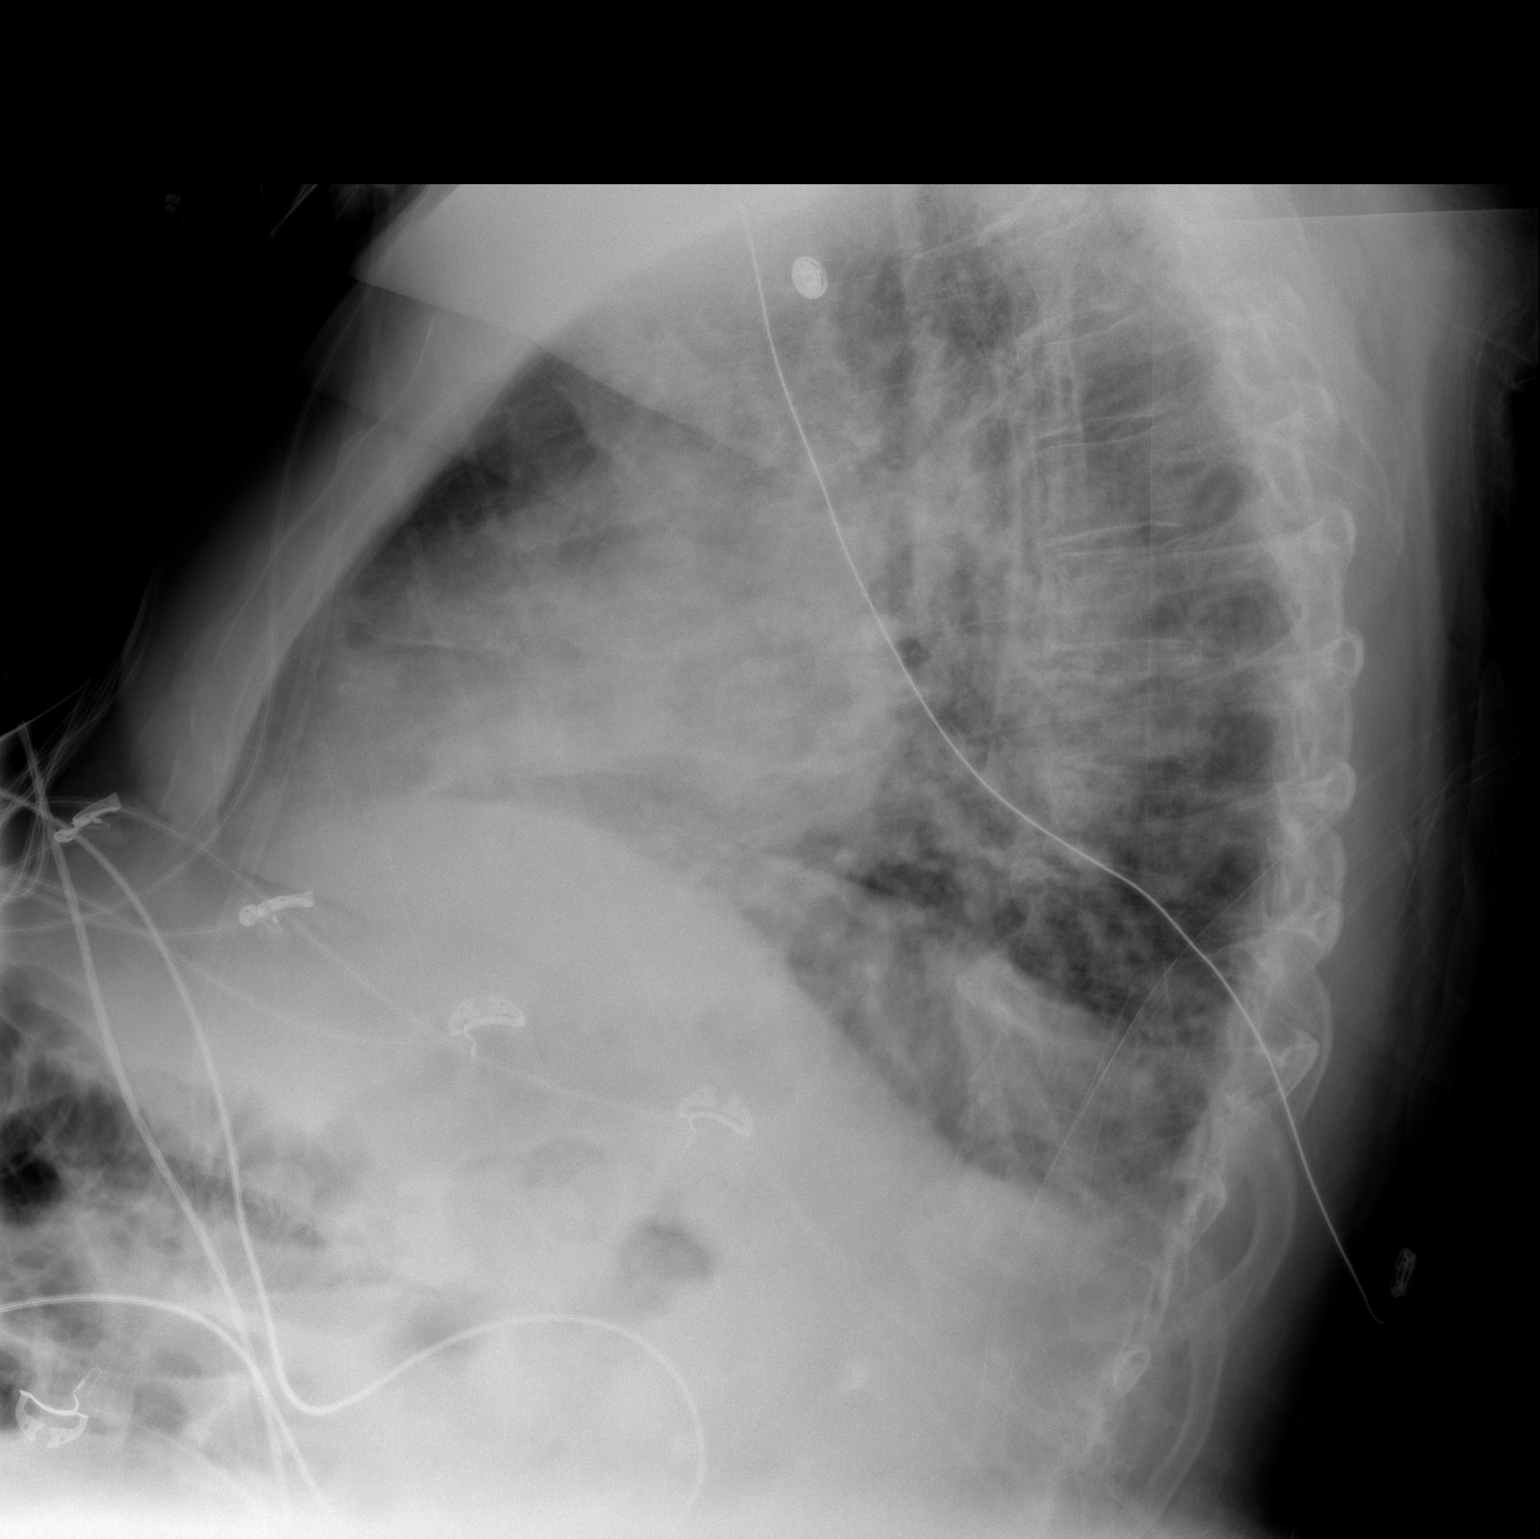

[1 of 1 positions shown; findings below may reference images not displayed]

FINDINGS: The patient has dense airspace disease in the right upper
lobe.  Patchy left basilar airspace opacity noted.  Heart size
upper normal. Small bilateral pleural effusions are present.
IMPRESSION: Dense right upper lobe pneumonia.  Recommend follow-up plain films
to clearing.

## 2012-01-10 IMAGING — CR DG CHEST 1V PORT
1 series · 1 of 1 positions shown · non-contrast
Comparison: Chest radiograph 07/15/2009

CLINICAL DATA: Line placement

PORTABLE CHEST - 1 VIEW

[view not recorded]
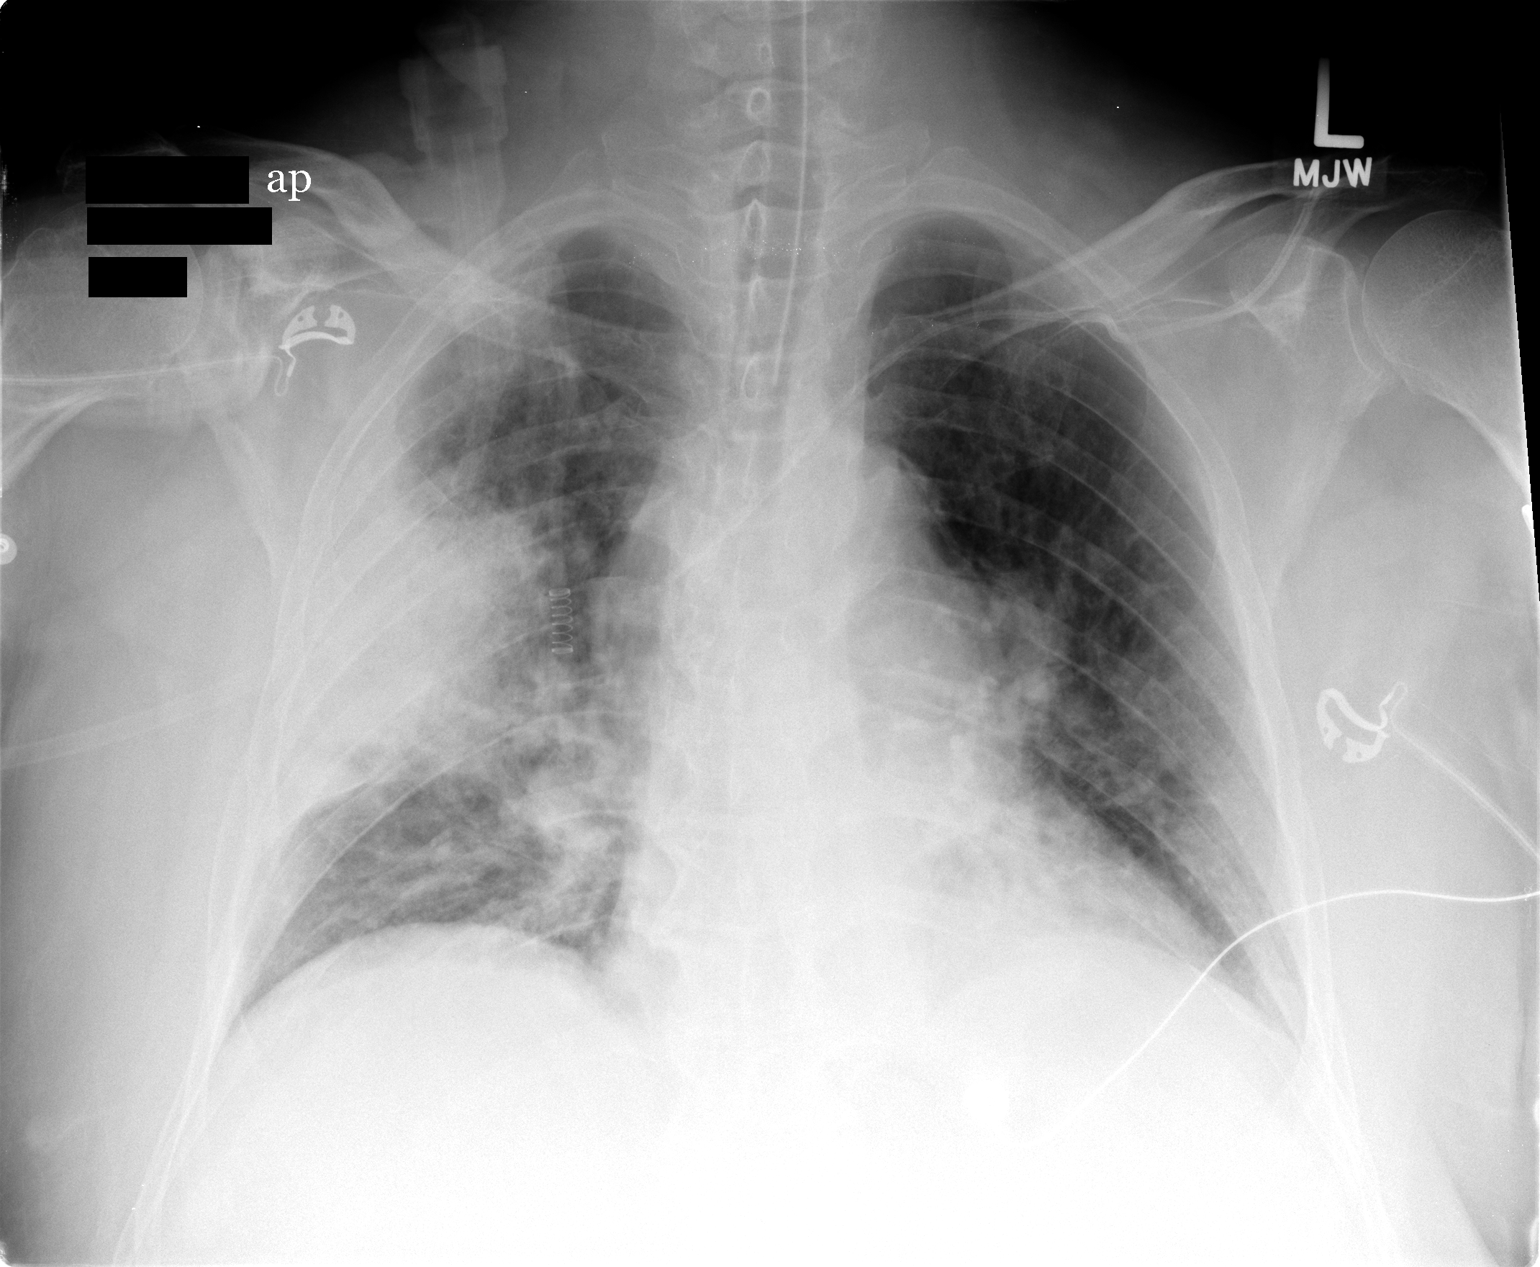

[1 of 1 positions shown; findings below may reference images not displayed]

FINDINGS: Interval placement of endotracheal tube which is
positioned 5.5 cm from carina in good position.  Stable cardiac
silhouette. There is mild perihilar air space disease.  There is
focal consolidative air space disease in the right upper lobe which
is unchanged.  No pneumothorax.
IMPRESSION: 1.  Endotracheal tube in good position.
2.  Stable perihilar air space disease and consolidation in the
right upper lobe.

## 2012-01-10 IMAGING — CT CT ABD-PELV W/ CM
3 of 5 series · 15 of 32 positions shown, 19 images · IV contrast (water & 100 ML OMNI 300)
Comparison: CT abdomen and pelvis 08/11/2005.

CLINICAL DATA: Abdominal pain.  Status post fall.

CT ABDOMEN AND PELVIS WITH CONTRAST
TECHNIQUE: Multidetector CT imaging of the abdomen and pelvis was
performed following the standard protocol during bolus
administration of intravenous contrast.
Contrast: 100 ml Nmnipaque-833.

[Series 3: routine abdomen · axial · 0.93mm/px · z∈[-440,-190]mm · 3 of 100 slices shown, 7 images]
[im 25/100  soft-tissue]
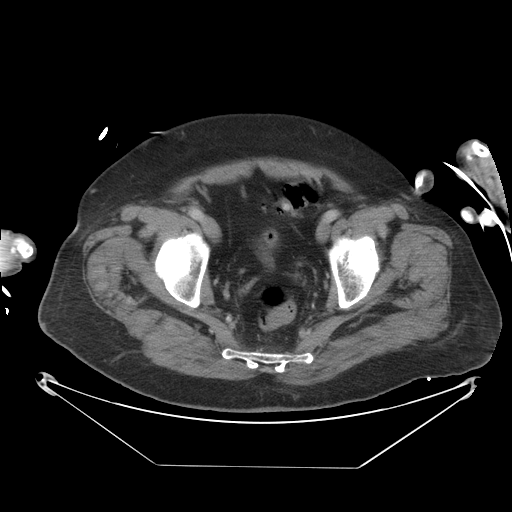
[im 25/100  lung]
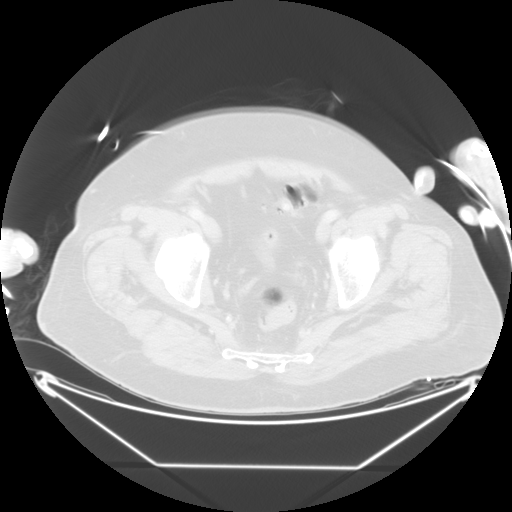
[im 25/100  bone]
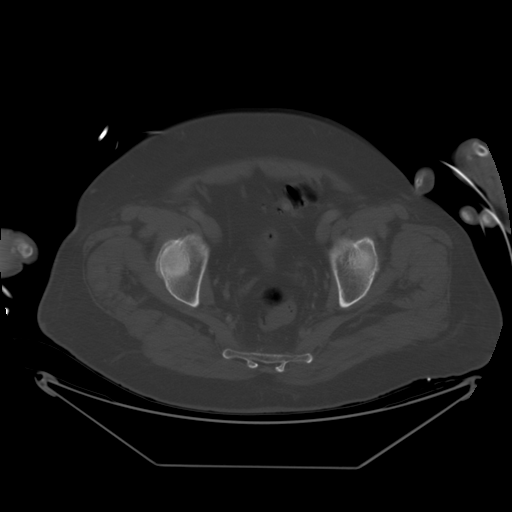
[im 50/100  soft-tissue]
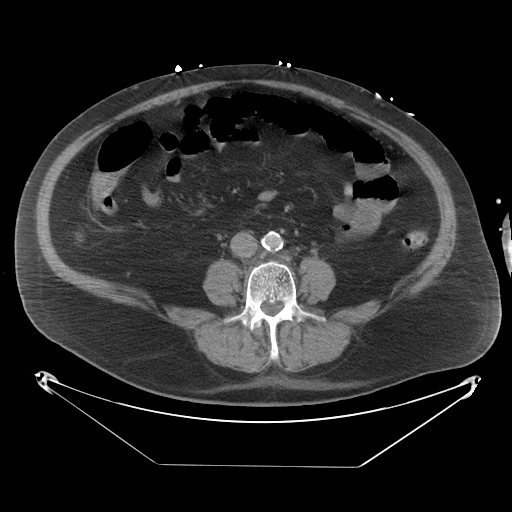
[im 50/100  lung]
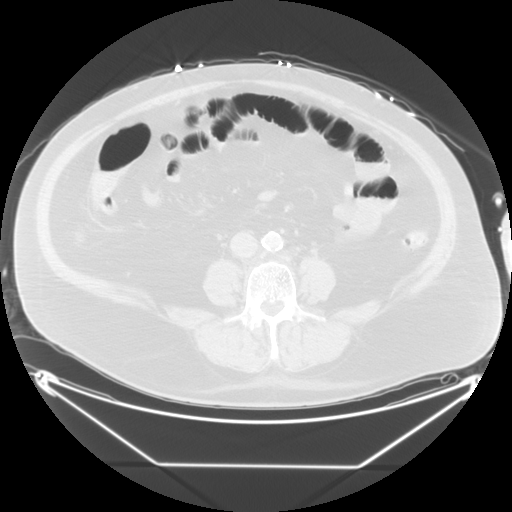
[im 75/100  soft-tissue]
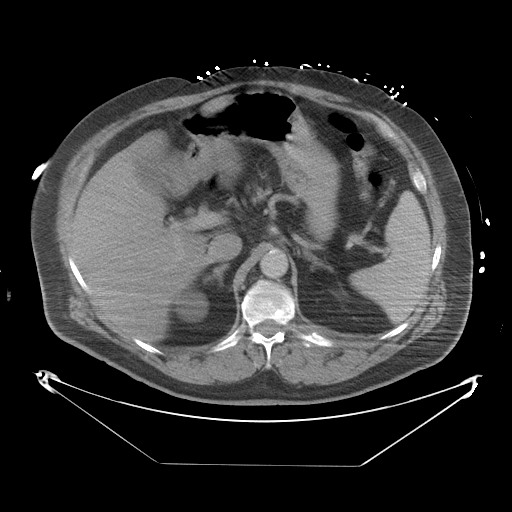
[im 75/100  lung]
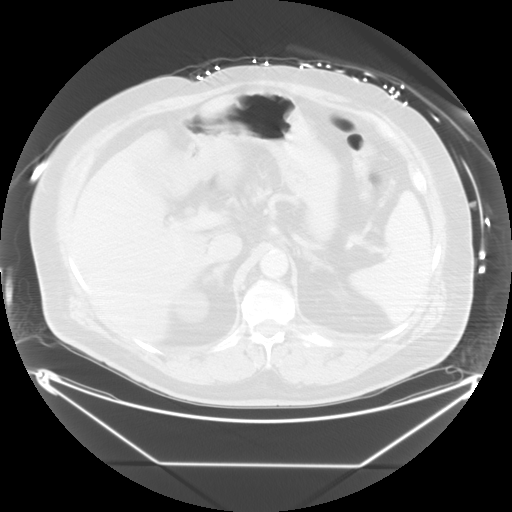

[Series 500: reformatted · sagittal · 0.99mm/px · 8 of 236 slices shown (1 of 2)]
[im 20/236  soft-tissue]
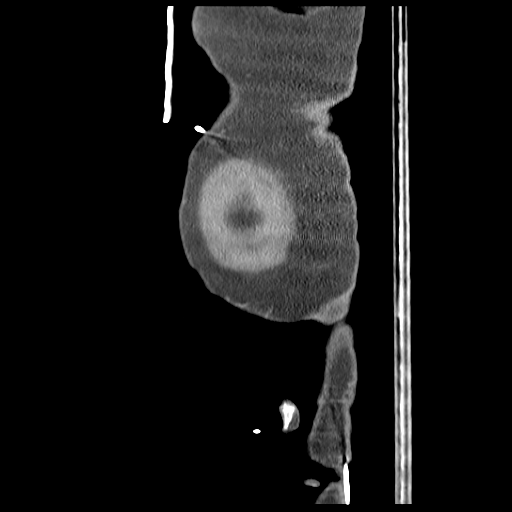
[im 59/236  soft-tissue]
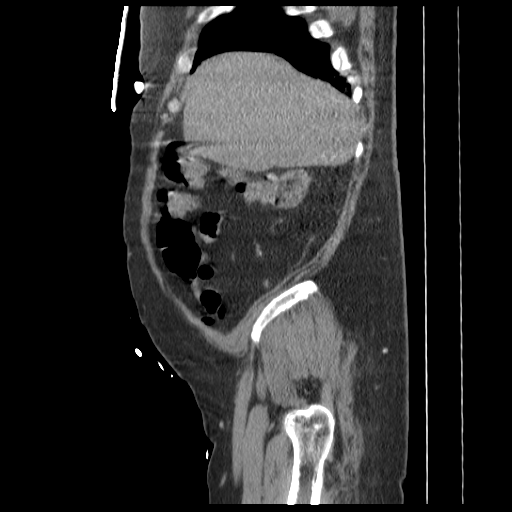
[im 79/236  soft-tissue]
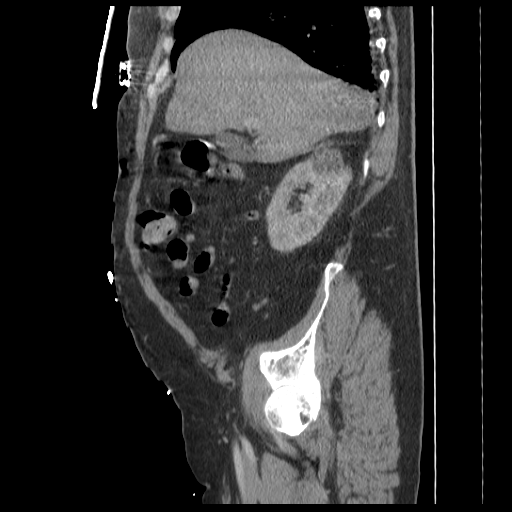
[im 98/236  soft-tissue]
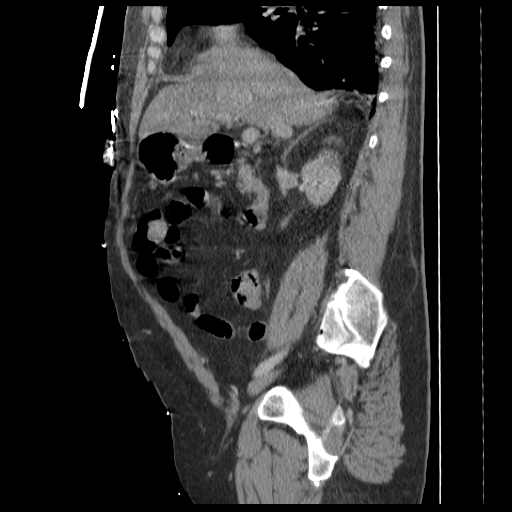
[im 138/236  soft-tissue]
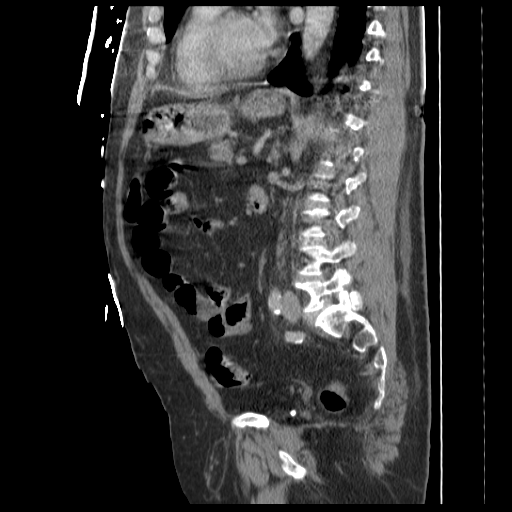
[im 157/236  soft-tissue]
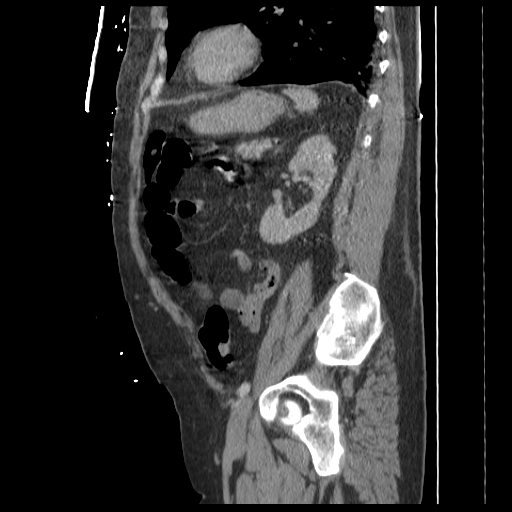
[im 177/236  soft-tissue]
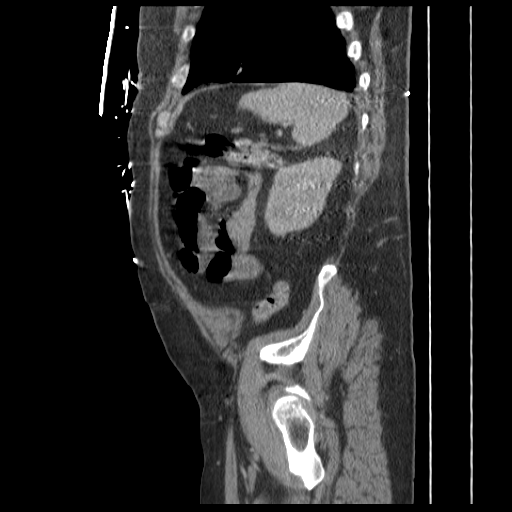
[im 216/236  soft-tissue]
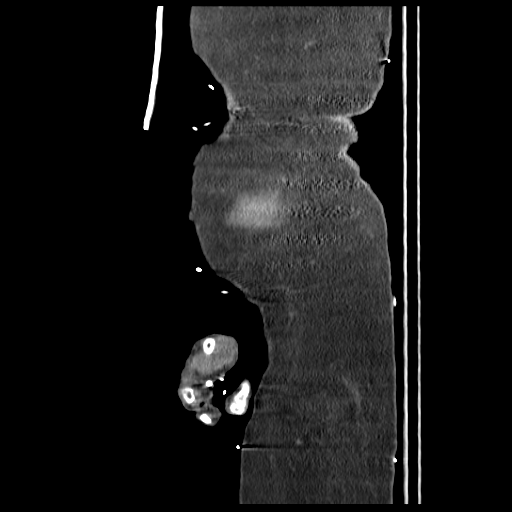

[Series 501: reformatted · coronal · 0.99mm/px · 4 of 178 slices shown (2 of 2)]
[im 20/178  soft-tissue]
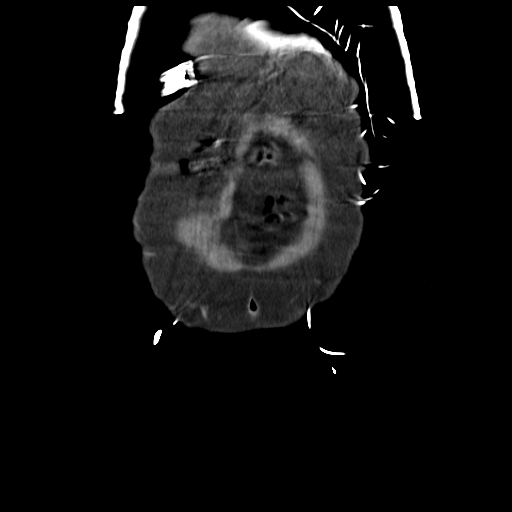
[im 40/178  soft-tissue]
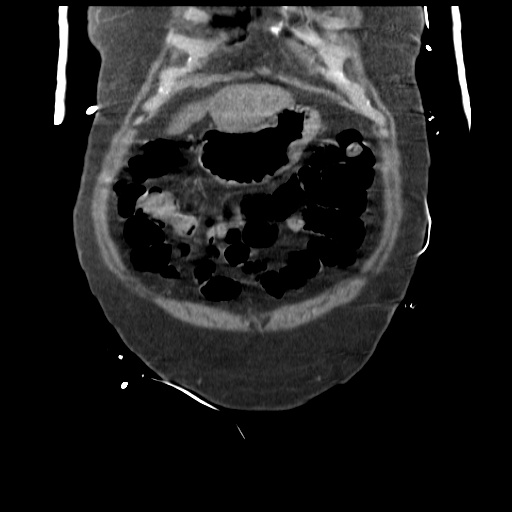
[im 60/178  soft-tissue]
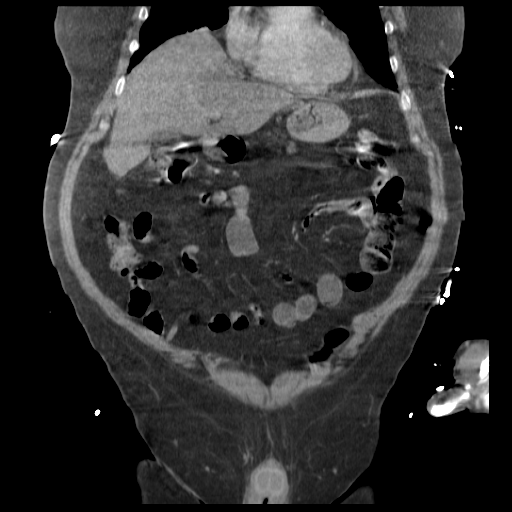
[im 79/178  soft-tissue]
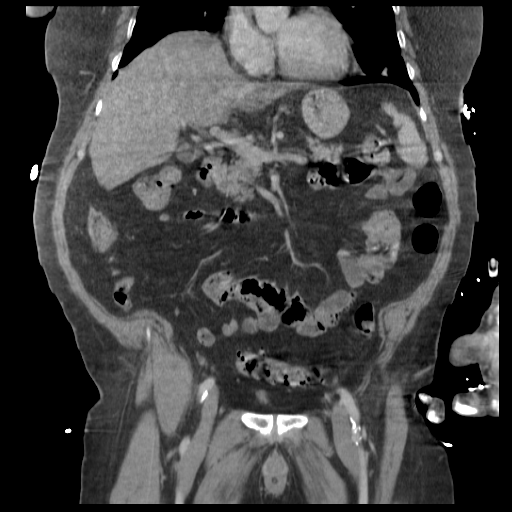

[15 of 32 positions shown; findings below may reference images not displayed]

FINDINGS: There is some dependent atelectatic change in the lung
bases.  No pleural or pericardial effusion.

The liver is low attenuating consistent with fatty change.  The
gallbladder, adrenal glands, spleen, pancreas all appear normal.
Bilateral renal cysts are unchanged with the largest in the upper
pole of the right kidney measuring 4.8 cm.  The stomach and small
and large bowel appear normal.  The appendix is unremarkable.  No
lymphadenopathy or fluid. A Foley catheter is in place in a
decompressed urinary bladder.  The patient is status post
hysterectomy.  Atherosclerotic vascular disease in a nonaneurysmal
aorta noted.  The patient has bilateral L5 pars interarticularis
defects with associated mild anterolisthesis.
IMPRESSION: 1.  No acute abnormality or finding to explain the patient's
symptoms.
2.  Status post hysterectomy.
3.  Atherosclerosis.
4.  Fatty infiltration of the liver.
5.  Renal cysts.
6.  Bilateral L5 pars interarticularis defects.

## 2012-01-11 IMAGING — CR DG CHEST 1V PORT
1 series · 1 of 1 positions shown · non-contrast
Comparison: 07/15/2009

CLINICAL DATA: Respiratory distress

PORTABLE CHEST - 1 VIEW

[view not recorded]
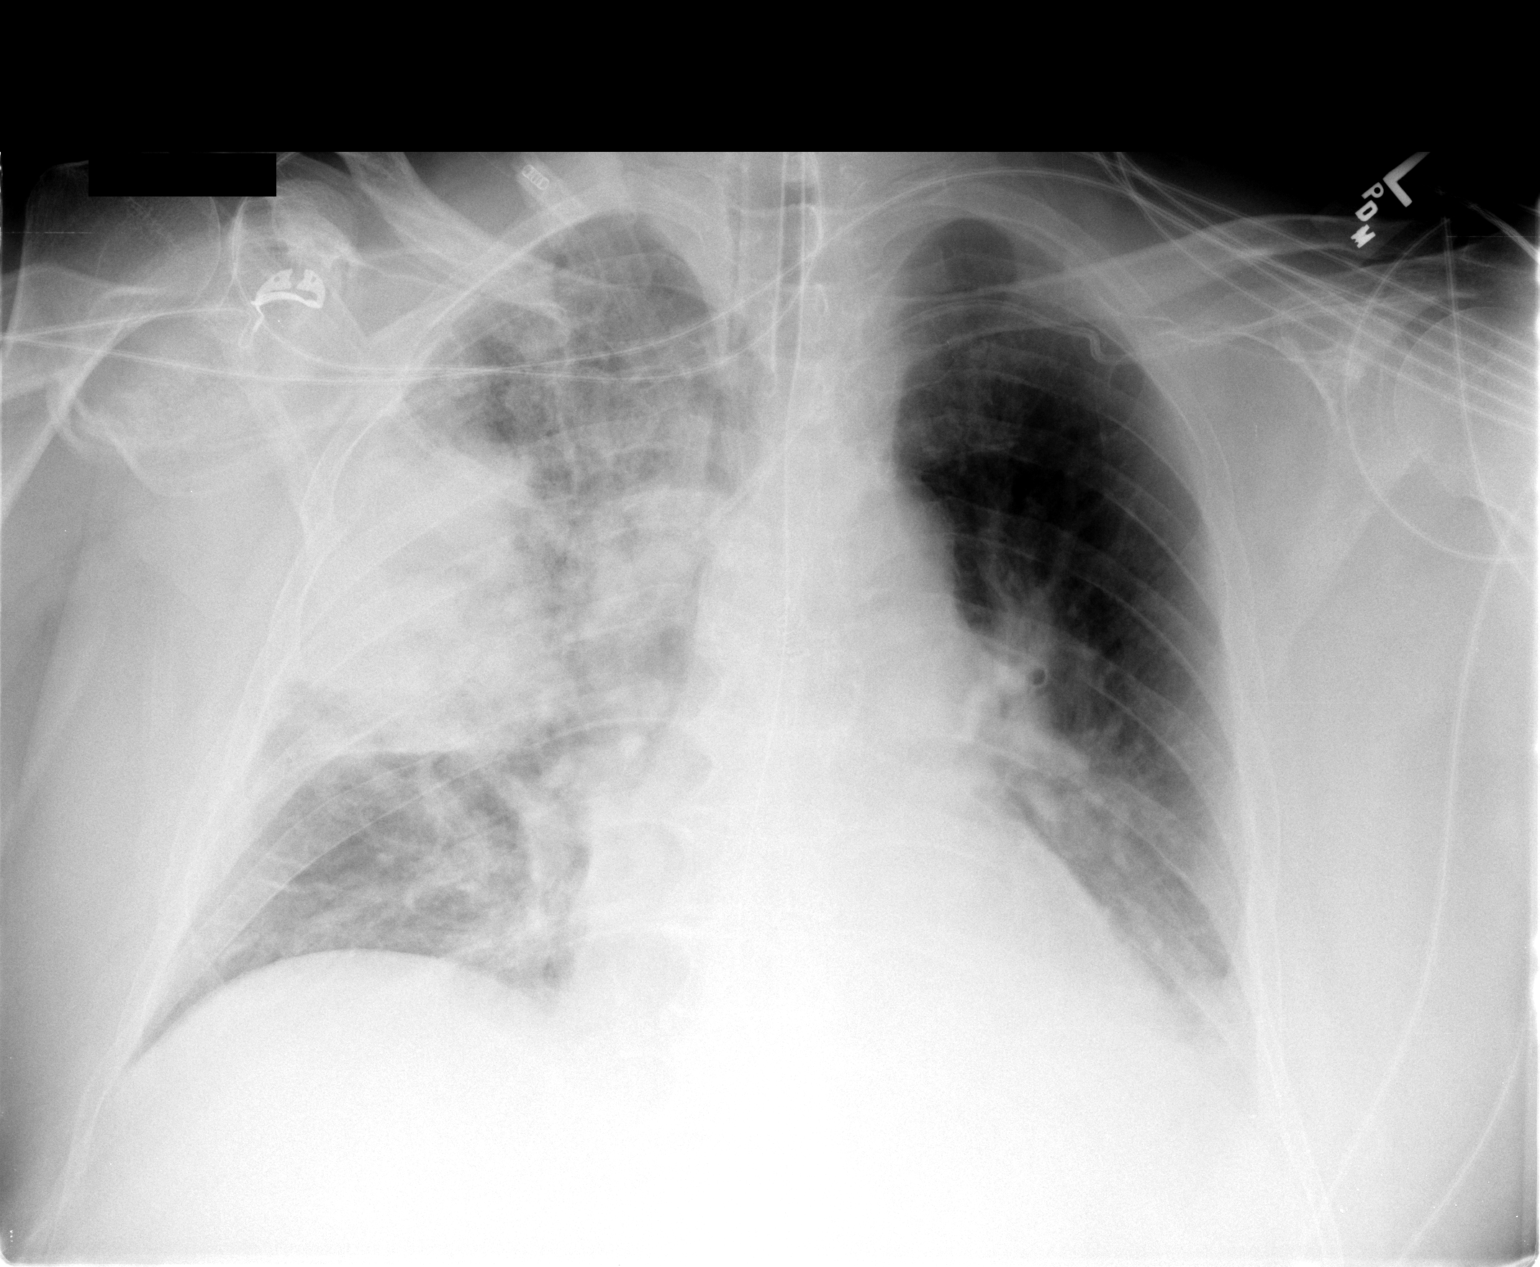

[1 of 1 positions shown; findings below may reference images not displayed]

FINDINGS: Right upper lobe consolidation stable.  Increased
bibasilar densities with interval worsening on the left.  Left
upper lobe remains clear.  Support apparatus stable.
IMPRESSION: Interval increase in left lower lobe airspace disease - otherwise
stable findings.

## 2012-01-12 IMAGING — CR DG CHEST 1V PORT
1 series · 1 of 1 positions shown · non-contrast
Comparison: 07/16/2009.

CLINICAL DATA: Rhabdomyolysis/asthma

PORTABLE CHEST - 1 VIEW

[AP]
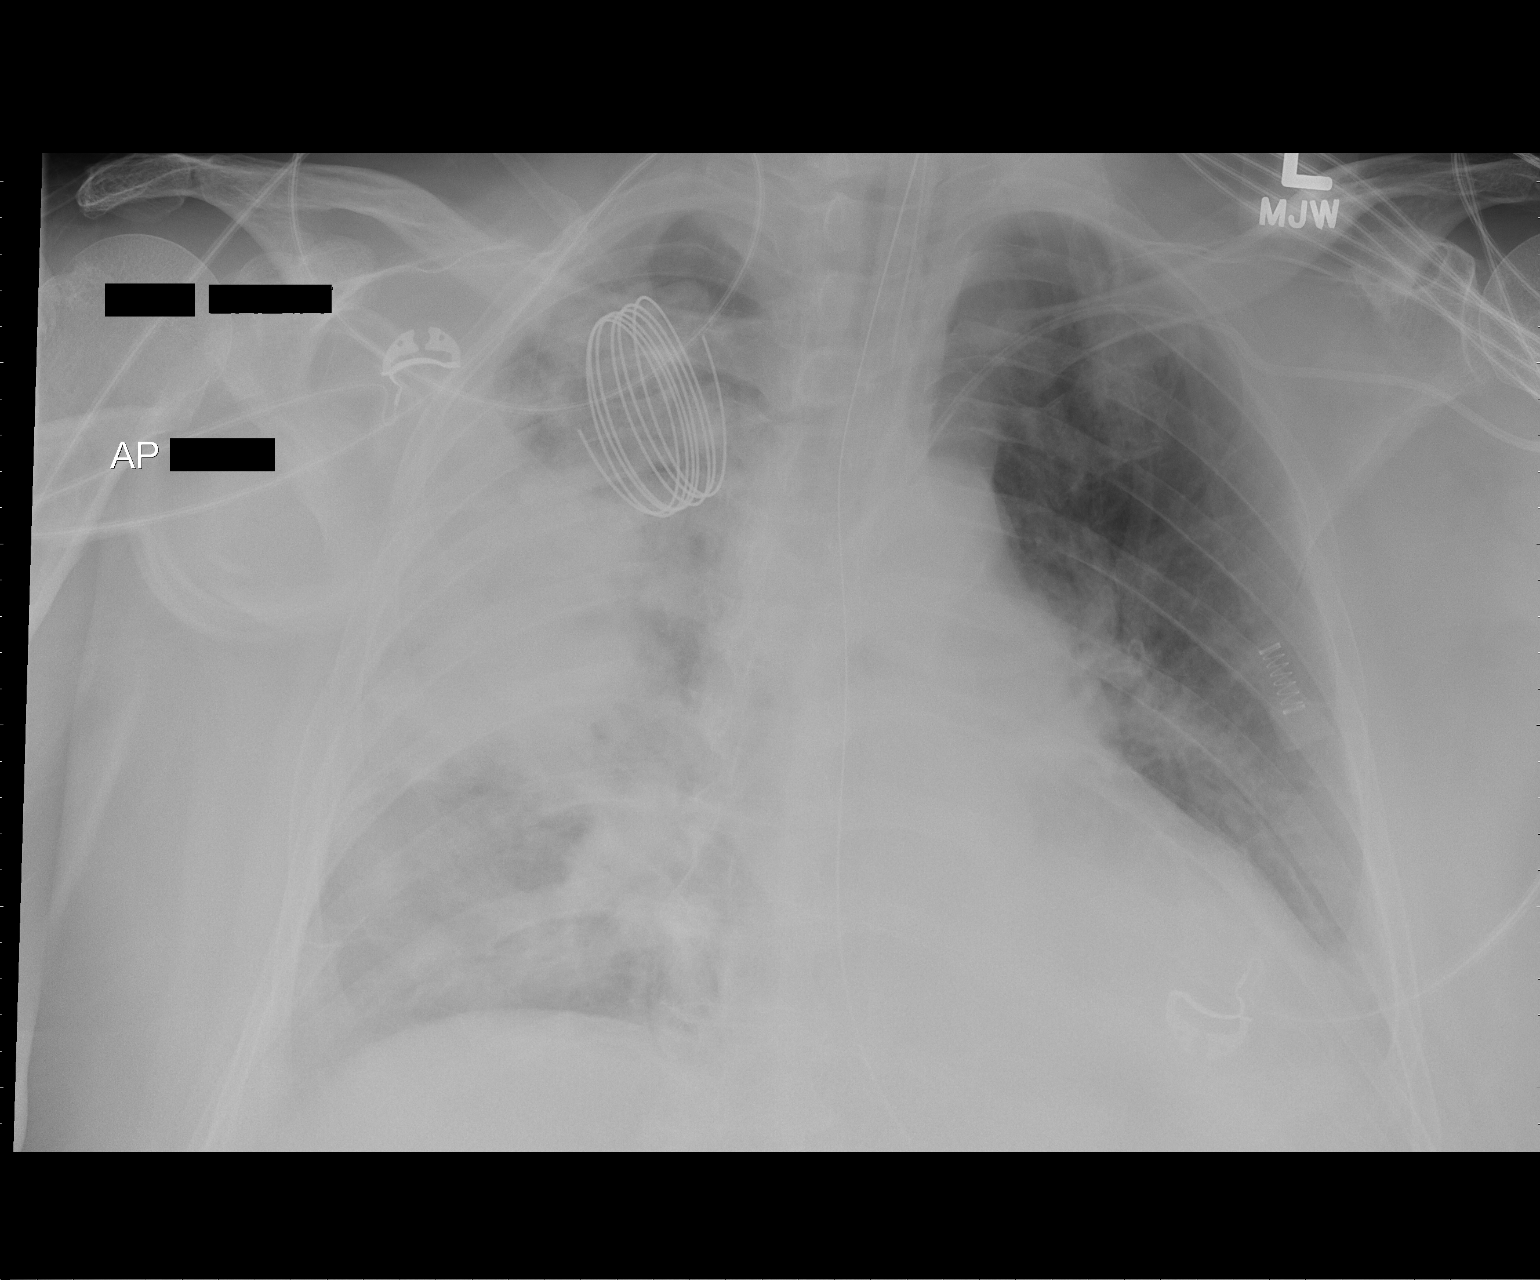

[1 of 1 positions shown; findings below may reference images not displayed]

FINDINGS: Focal airspace disease right upper lung zone presents as
a consolidation which is unchanged.  Left lower lobe atelectasis /
consolidation is again noted.  ETT is in satisfactory position.
CVC is in the upper SVC.  NG tube is noted traversing esophagus and
process stomach.
IMPRESSION: No significant interval change.  Right lung consolidation persist.
Left lower lobe atelectasis / consolidation persist.

## 2012-01-13 IMAGING — CR DG CHEST 1V PORT
1 series · 1 of 1 positions shown · non-contrast
Comparison: 07/17/2009

CLINICAL DATA: Rhabdomyolysis, ventilator, asthma.

PORTABLE CHEST - 1 VIEW

[AP]
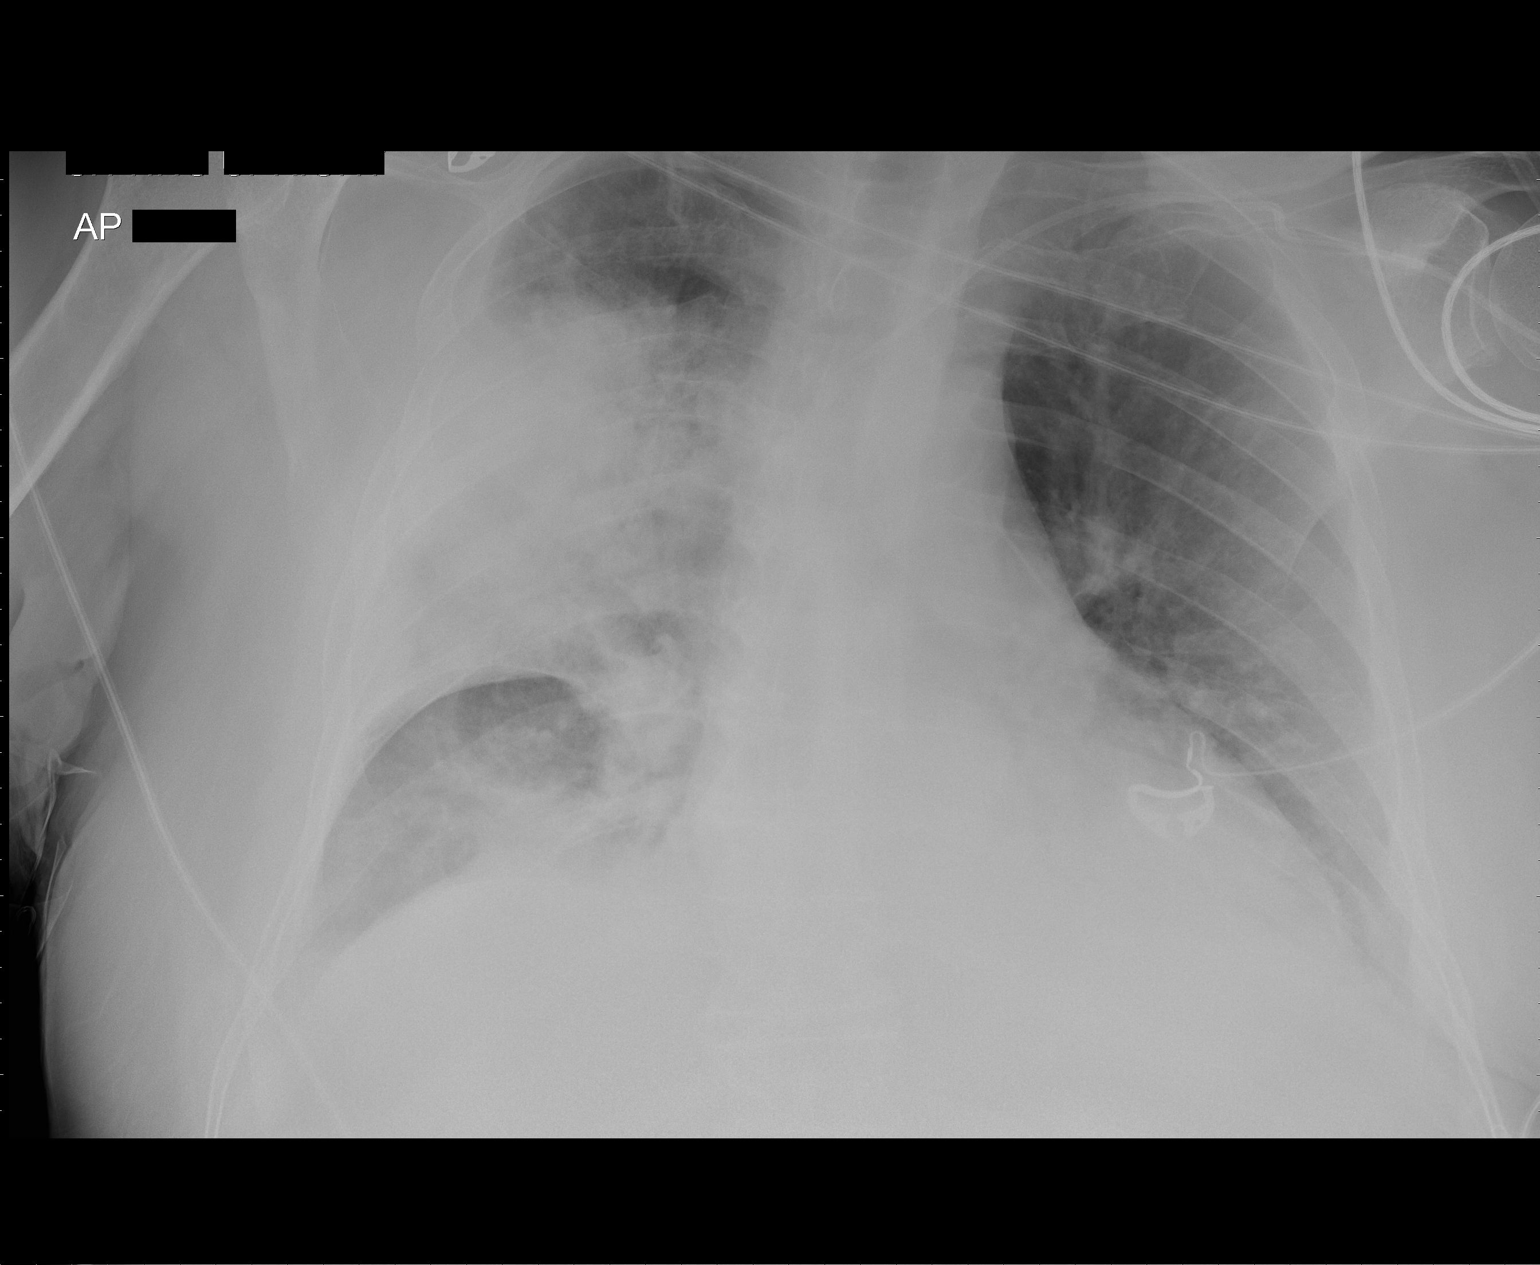

[1 of 1 positions shown; findings below may reference images not displayed]

FINDINGS: Trachea is midline.  Endotracheal tube and nasogastric
tube have been removed.  Heart size stable.  Dense airspace
consolidation is again seen in the right upper lobe.  Additional
patchy air space disease is seen in both lung bases.  Probable
bilateral pleural effusions.  Left subclavian central line tip
projects near the junction of the brachiocephalic veins.
IMPRESSION: 1.  Stable dense airspace consolidation in the right upper lobe has
the appearance of pneumonia.
2.  Bibasilar air space disease.
3.  Probable bilateral pleural effusions.

## 2012-01-14 IMAGING — CR DG CHEST 1V PORT
1 series · 1 of 1 positions shown · non-contrast
Comparison: 07/18/2009

CLINICAL DATA: Rhabdomyolysis, asthma

PORTABLE CHEST - 1 VIEW

[view not recorded]
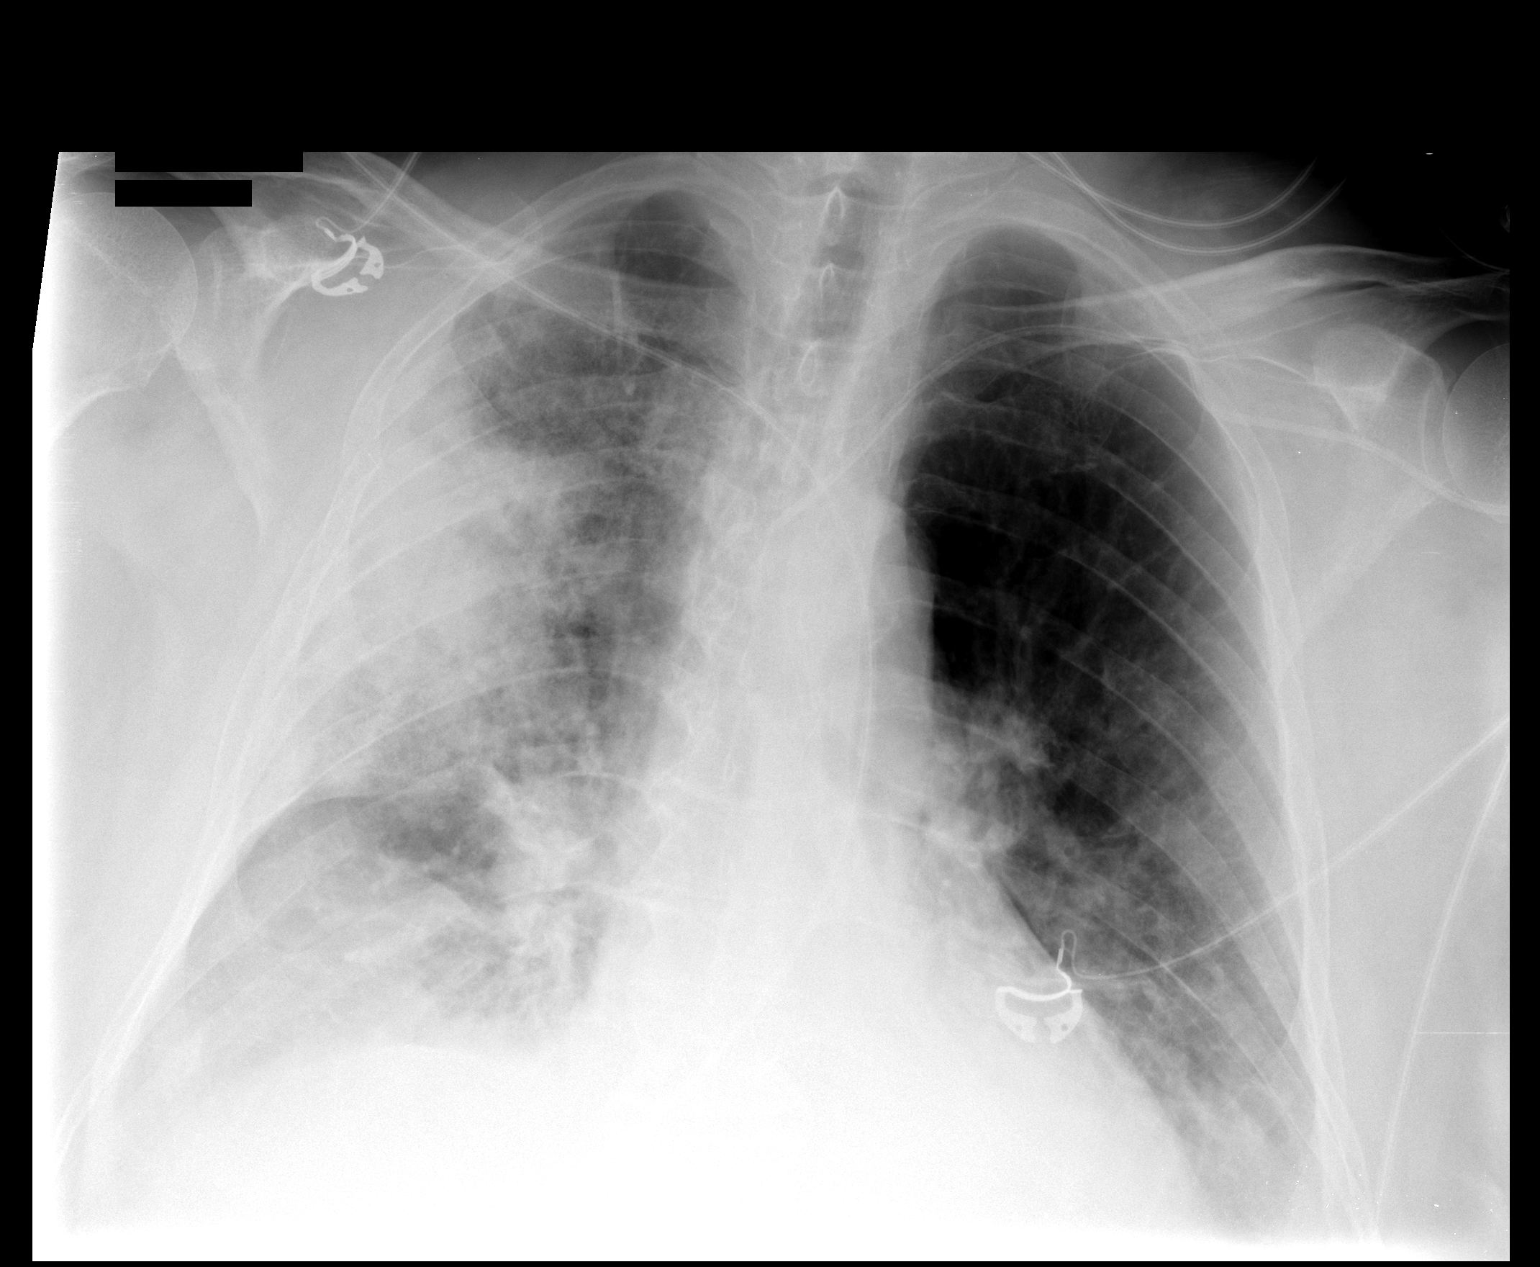

[1 of 1 positions shown; findings below may reference images not displayed]

FINDINGS: Left subclavian approach central line tip over proximal
SVC.  No pneumothorax.  Improving right upper lobe airspace disease
with persistent bibasilar consolidation and effusions.  Left
costophrenic angle omitted from the film.
IMPRESSION: Improving right upper lobe airspace disease, likely pneumonia
versus asymmetric edema or possibly aspiration.

## 2012-01-16 DIAGNOSIS — I609 Nontraumatic subarachnoid hemorrhage, unspecified: Secondary | ICD-10-CM

## 2012-01-16 DIAGNOSIS — I4891 Unspecified atrial fibrillation: Secondary | ICD-10-CM

## 2012-01-16 HISTORY — DX: Unspecified atrial fibrillation: I48.91

## 2012-01-16 HISTORY — DX: Nontraumatic subarachnoid hemorrhage, unspecified: I60.9

## 2012-01-17 IMAGING — CT CT HEAD W/O CM
1 of 2 series · 16 of 30 positions shown, 20 images · non-contrast
Comparison: 07/15/2009

CLINICAL DATA: Altered mental status.  Asthma.

CT HEAD WITHOUT CONTRAST
TECHNIQUE: Contiguous axial images were obtained from the base of
the skull through the vertex without contrast.

[Series 2: head routine 4.8 h37s · axial · 0.45mm/px · z∈[-125,+35]mm · 16 of 36 slices shown, 20 images]
[im 2/36  brain]
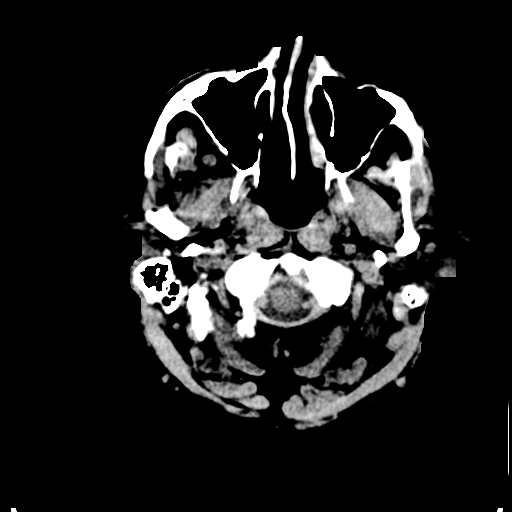
[im 2/36  bone]
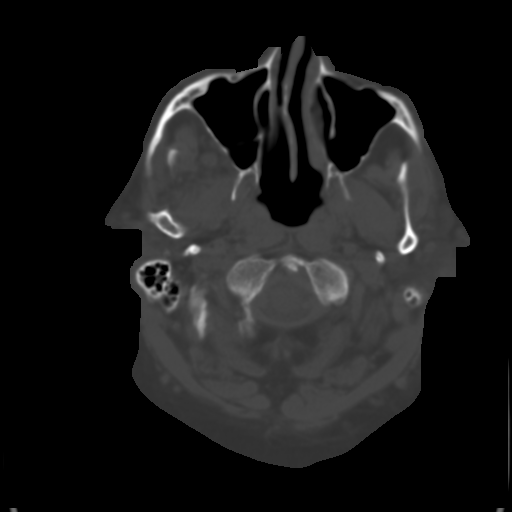
[im 4/36  brain]
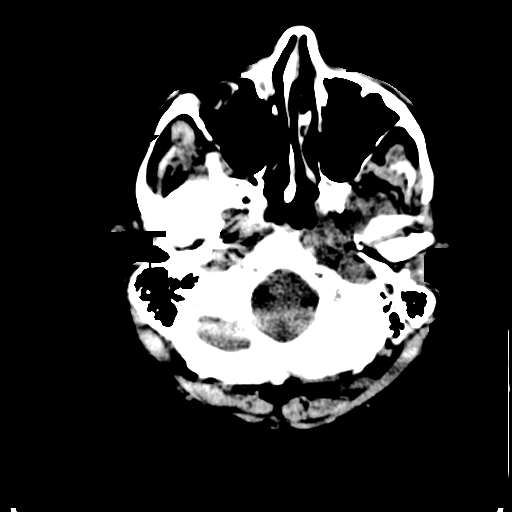
[im 7/36  brain]
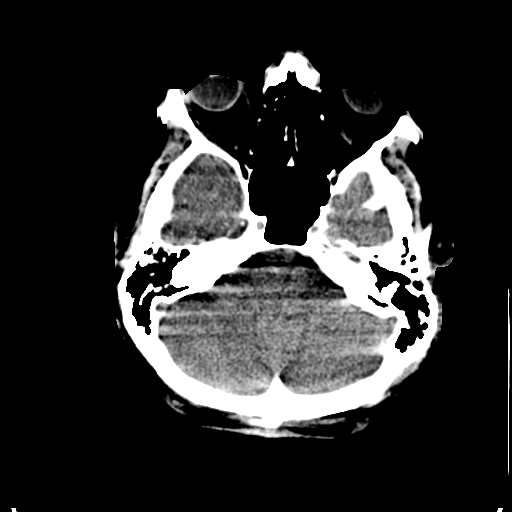
[im 9/36  brain]
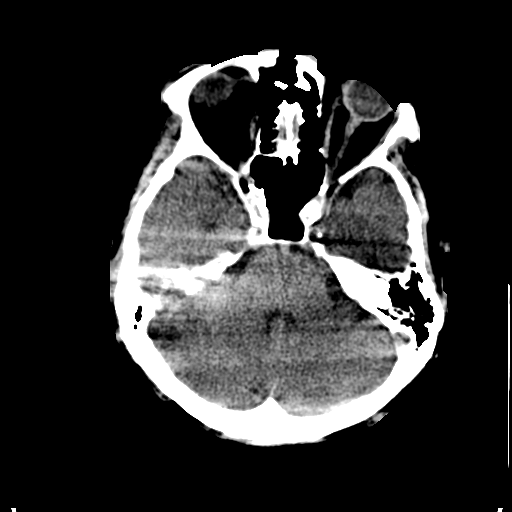
[im 11/36  brain]
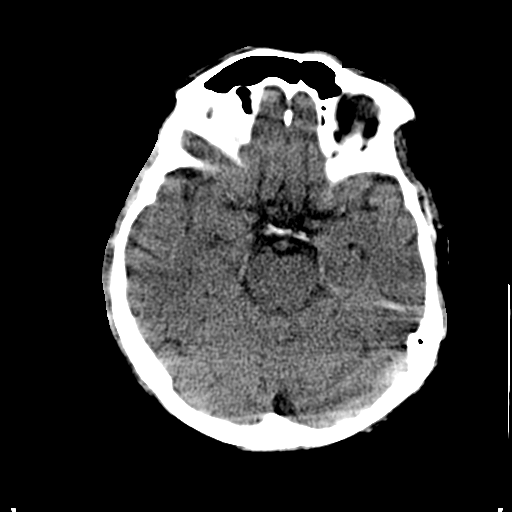
[im 11/36  bone]
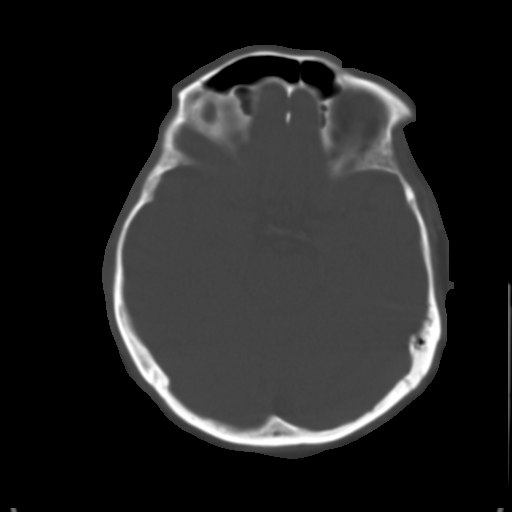
[im 12/36  brain]
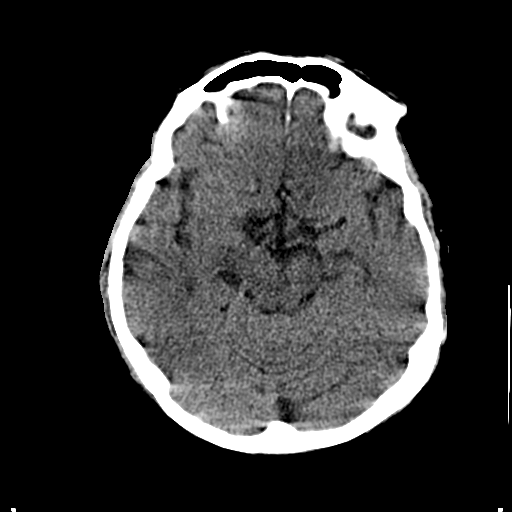
[im 16/36  brain]
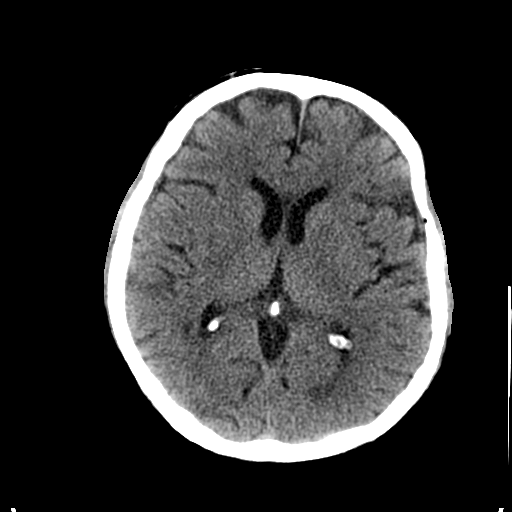
[im 17/36  brain]
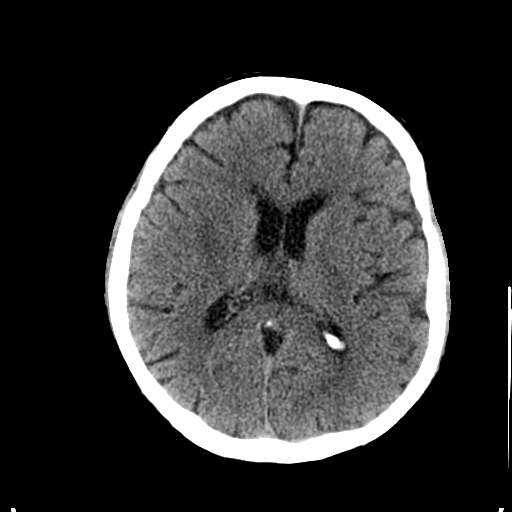
[im 19/36  brain]
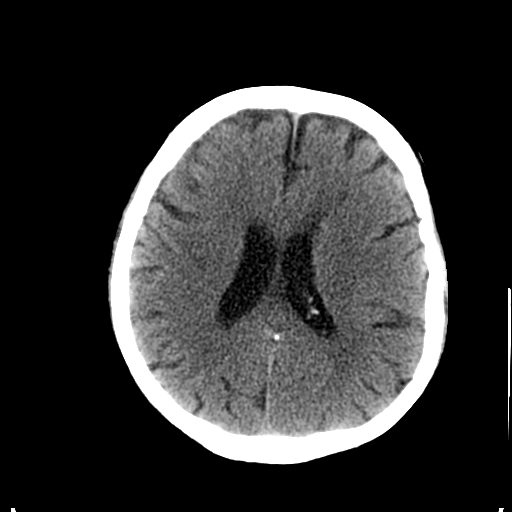
[im 19/36  bone]
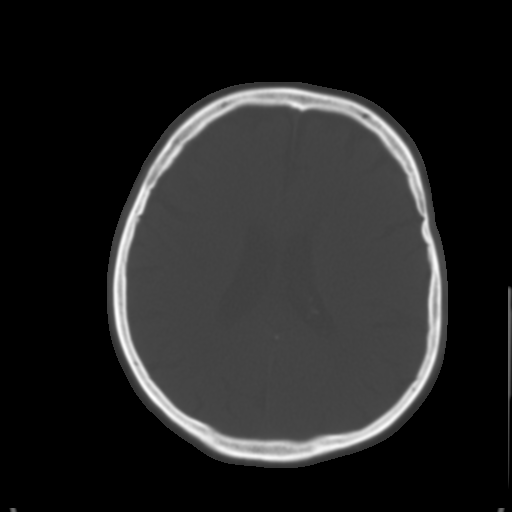
[im 21/36  brain]
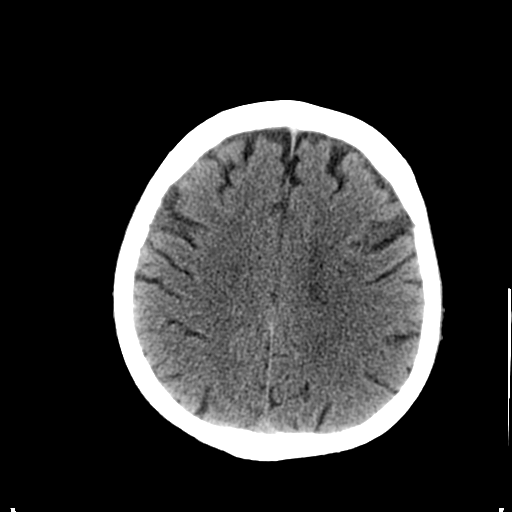
[im 24/36  brain]
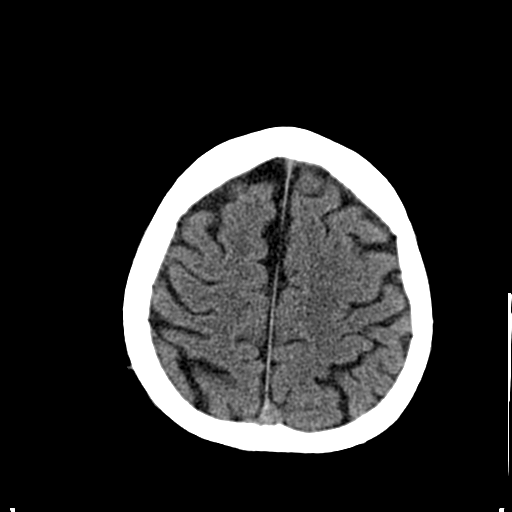
[im 26/36  brain]
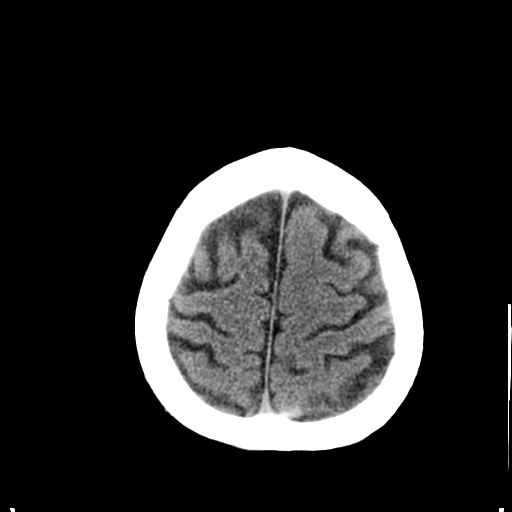
[im 27/36  brain]
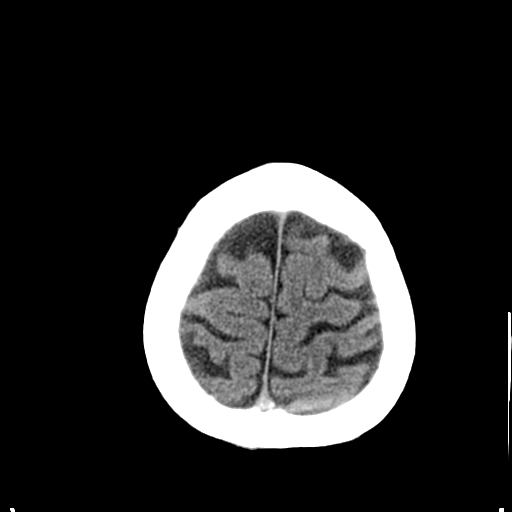
[im 27/36  bone]
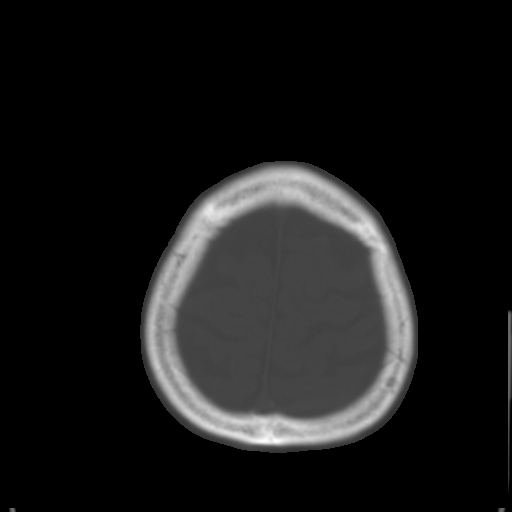
[im 29/36  brain]
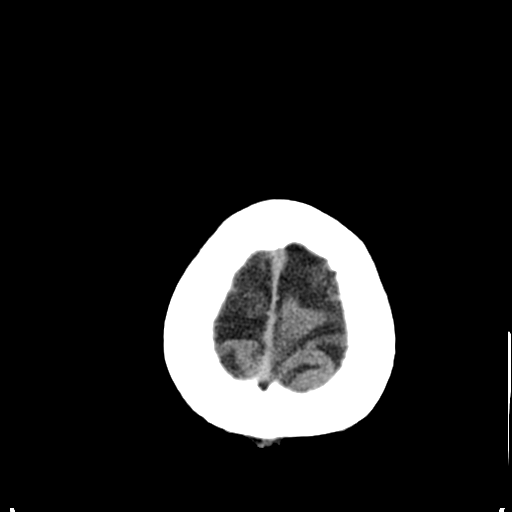
[im 32/36  brain]
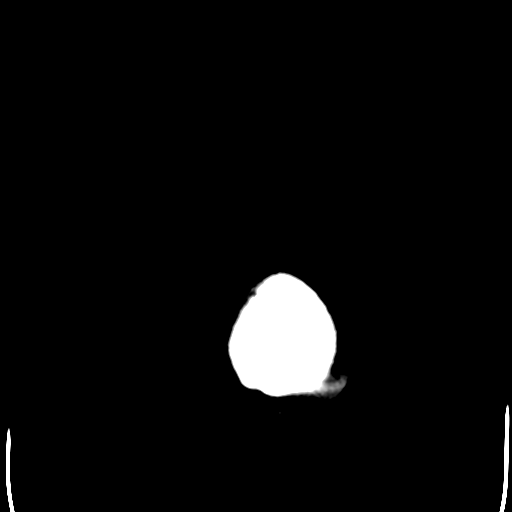
[im 34/36  brain]
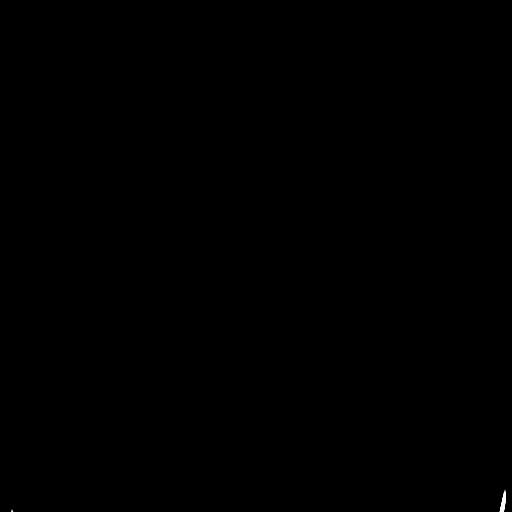

[16 of 30 positions shown; findings below may reference images not displayed]

FINDINGS: Bone windows demonstrate mild motion degradation. Clear
paranasal sinuses and mastoid air cells.

Soft tissue windows demonstrate mildly age advanced cerebral
atrophy.  Mild low density in the periventricular white matter
likely related to small vessel disease.  Within the
periventricular white matter of the right frontal lobe, a vague
area of hypoattenuation is suggested on image 17 series 2.  This is
similar to on the 07/15/2009 exam.  Favored to relate to remote
ischemia or small vessel disease.

No hydrocephalous, acute hemorrhage, mass lesion, intra-axial,
extra-axial fluid collection.
IMPRESSION: 1.  Mildly motion degraded exam.
2.  Cerebral atrophy and small vessel ischemic change.

## 2012-02-03 ENCOUNTER — Observation Stay (HOSPITAL_COMMUNITY): Payer: Medicare HMO

## 2012-02-03 ENCOUNTER — Emergency Department (HOSPITAL_COMMUNITY): Payer: Medicare HMO

## 2012-02-03 ENCOUNTER — Inpatient Hospital Stay (HOSPITAL_COMMUNITY)
Admission: EM | Admit: 2012-02-03 | Discharge: 2012-02-08 | DRG: 100 | Disposition: A | Discharge status: Home / Self Care | Payer: Medicare HMO | Attending: Family Medicine | Admitting: Family Medicine

## 2012-02-03 DIAGNOSIS — S065XAA Traumatic subdural hemorrhage with loss of consciousness status unknown, initial encounter: Secondary | ICD-10-CM | POA: Diagnosis present

## 2012-02-03 DIAGNOSIS — R4182 Altered mental status, unspecified: Secondary | ICD-10-CM

## 2012-02-03 DIAGNOSIS — K219 Gastro-esophageal reflux disease without esophagitis: Secondary | ICD-10-CM | POA: Diagnosis present

## 2012-02-03 DIAGNOSIS — G40909 Epilepsy, unspecified, not intractable, without status epilepticus: Principal | ICD-10-CM | POA: Diagnosis present

## 2012-02-03 DIAGNOSIS — Z8669 Personal history of other diseases of the nervous system and sense organs: Secondary | ICD-10-CM

## 2012-02-03 DIAGNOSIS — E119 Type 2 diabetes mellitus without complications: Secondary | ICD-10-CM | POA: Diagnosis present

## 2012-02-03 DIAGNOSIS — E785 Hyperlipidemia, unspecified: Secondary | ICD-10-CM | POA: Diagnosis present

## 2012-02-03 DIAGNOSIS — S065X0A Traumatic subdural hemorrhage without loss of consciousness, initial encounter: Secondary | ICD-10-CM | POA: Diagnosis present

## 2012-02-03 DIAGNOSIS — K449 Diaphragmatic hernia without obstruction or gangrene: Secondary | ICD-10-CM | POA: Diagnosis present

## 2012-02-03 DIAGNOSIS — I2510 Atherosclerotic heart disease of native coronary artery without angina pectoris: Secondary | ICD-10-CM | POA: Diagnosis present

## 2012-02-03 DIAGNOSIS — F1010 Alcohol abuse, uncomplicated: Secondary | ICD-10-CM | POA: Diagnosis present

## 2012-02-03 DIAGNOSIS — I4891 Unspecified atrial fibrillation: Secondary | ICD-10-CM | POA: Diagnosis present

## 2012-02-03 DIAGNOSIS — F3289 Other specified depressive episodes: Secondary | ICD-10-CM | POA: Diagnosis present

## 2012-02-03 DIAGNOSIS — I252 Old myocardial infarction: Secondary | ICD-10-CM

## 2012-02-03 DIAGNOSIS — W19XXXA Unspecified fall, initial encounter: Secondary | ICD-10-CM | POA: Diagnosis present

## 2012-02-03 DIAGNOSIS — S065X9A Traumatic subdural hemorrhage with loss of consciousness of unspecified duration, initial encounter: Secondary | ICD-10-CM

## 2012-02-03 DIAGNOSIS — F329 Major depressive disorder, single episode, unspecified: Secondary | ICD-10-CM

## 2012-02-03 DIAGNOSIS — M069 Rheumatoid arthritis, unspecified: Secondary | ICD-10-CM | POA: Diagnosis present

## 2012-02-03 DIAGNOSIS — F1920 Other psychoactive substance dependence, uncomplicated: Secondary | ICD-10-CM | POA: Diagnosis present

## 2012-02-03 DIAGNOSIS — K5730 Diverticulosis of large intestine without perforation or abscess without bleeding: Secondary | ICD-10-CM | POA: Diagnosis present

## 2012-02-03 DIAGNOSIS — I2589 Other forms of chronic ischemic heart disease: Secondary | ICD-10-CM | POA: Diagnosis present

## 2012-02-03 DIAGNOSIS — J4489 Other specified chronic obstructive pulmonary disease: Secondary | ICD-10-CM | POA: Diagnosis present

## 2012-02-03 DIAGNOSIS — J449 Chronic obstructive pulmonary disease, unspecified: Secondary | ICD-10-CM | POA: Diagnosis present

## 2012-02-03 DIAGNOSIS — F17200 Nicotine dependence, unspecified, uncomplicated: Secondary | ICD-10-CM | POA: Diagnosis present

## 2012-02-03 DIAGNOSIS — R32 Unspecified urinary incontinence: Secondary | ICD-10-CM | POA: Diagnosis present

## 2012-02-03 DIAGNOSIS — R569 Unspecified convulsions: Secondary | ICD-10-CM | POA: Diagnosis present

## 2012-02-03 DIAGNOSIS — L02419 Cutaneous abscess of limb, unspecified: Secondary | ICD-10-CM | POA: Diagnosis present

## 2012-02-03 DIAGNOSIS — I10 Essential (primary) hypertension: Secondary | ICD-10-CM | POA: Diagnosis present

## 2012-02-03 DIAGNOSIS — IMO0001 Reserved for inherently not codable concepts without codable children: Secondary | ICD-10-CM | POA: Diagnosis present

## 2012-02-03 DIAGNOSIS — E876 Hypokalemia: Secondary | ICD-10-CM | POA: Diagnosis not present

## 2012-02-03 DIAGNOSIS — R159 Full incontinence of feces: Secondary | ICD-10-CM | POA: Diagnosis present

## 2012-02-03 DIAGNOSIS — I259 Chronic ischemic heart disease, unspecified: Secondary | ICD-10-CM

## 2012-02-03 DIAGNOSIS — I2582 Chronic total occlusion of coronary artery: Secondary | ICD-10-CM | POA: Diagnosis present

## 2012-02-03 DIAGNOSIS — R531 Weakness: Secondary | ICD-10-CM

## 2012-02-03 DIAGNOSIS — R52 Pain, unspecified: Secondary | ICD-10-CM

## 2012-02-03 DIAGNOSIS — F411 Generalized anxiety disorder: Secondary | ICD-10-CM

## 2012-02-03 LAB — URINALYSIS, ROUTINE W REFLEX MICROSCOPIC
Leukocytes, UA: NEGATIVE
Protein, ur: >300 mg/dL — AB
Specific Gravity, Urine: 1.018 (ref 1.005–1.030)
Urobilinogen, UA: 1 mg/dL (ref 0.0–1.0)

## 2012-02-03 LAB — CBC
HCT: 46.7 % (ref 39.0–52.0)
Hemoglobin: 15.3 g/dL (ref 13.0–17.0)
MCHC: 32.8 g/dL (ref 30.0–36.0)
Platelets: 255 10*3/uL (ref 150–400)
RBC: 4.39 MIL/uL (ref 4.22–5.81)
RBC: 4.41 MIL/uL (ref 4.22–5.81)
WBC: 13.5 10*3/uL — ABNORMAL HIGH (ref 4.0–10.5)
WBC: 9.5 10*3/uL (ref 4.0–10.5)

## 2012-02-03 LAB — COMPREHENSIVE METABOLIC PANEL
Alkaline Phosphatase: 75 U/L (ref 39–117)
BUN: 8 mg/dL (ref 6–23)
CO2: 22 mEq/L (ref 19–32)
Chloride: 99 mEq/L (ref 96–112)
Creatinine, Ser: 0.81 mg/dL (ref 0.50–1.35)
GFR calc non Af Amer: >90 mL/min (ref >90)
Glucose, Bld: 111 mg/dL — ABNORMAL HIGH (ref 70–99)
Potassium: 3.1 mEq/L — ABNORMAL LOW (ref 3.5–5.1)
Total Bilirubin: 0.5 mg/dL (ref 0.3–1.2)

## 2012-02-03 LAB — CREATININE, SERUM
Creatinine, Ser: 0.57 mg/dL (ref 0.50–1.35)
GFR calc Af Amer: >90 mL/min (ref >90)

## 2012-02-03 LAB — PROTIME-INR: INR: 1.03 (ref 0.00–1.49)

## 2012-02-03 LAB — DIFFERENTIAL
Lymphocytes Relative: 31 % (ref 12–46)
Lymphs Abs: 4.2 10*3/uL — ABNORMAL HIGH (ref 0.7–4.0)
Monocytes Absolute: 1.2 10*3/uL — ABNORMAL HIGH (ref 0.1–1.0)
Monocytes Relative: 9 % (ref 3–12)
Neutro Abs: 8 10*3/uL — ABNORMAL HIGH (ref 1.7–7.7)
Neutrophils Relative %: 59 % (ref 43–77)

## 2012-02-03 LAB — RAPID URINE DRUG SCREEN, HOSP PERFORMED
Cocaine: NOT DETECTED
Opiates: NOT DETECTED
Tetrahydrocannabinol: NOT DETECTED

## 2012-02-03 LAB — AMMONIA: Ammonia: 46 umol/L (ref 11–60)

## 2012-02-03 LAB — ETHANOL: Alcohol, Ethyl (B): <11 mg/dL (ref 0–11)

## 2012-02-03 LAB — SALICYLATE LEVEL: Salicylate Lvl: <2 mg/dL — ABNORMAL LOW (ref 2.8–20.0)

## 2012-02-03 LAB — TROPONIN I: Troponin I: <0.3 ng/mL (ref <0.30)

## 2012-02-03 MED ORDER — IPRATROPIUM-ALBUTEROL 18-103 MCG/ACT IN AERO
2.0000 | INHALATION_SPRAY | RESPIRATORY_TRACT | Status: DC | PRN
  Filled 2012-02-03: qty 14.7

## 2012-02-03 MED ORDER — LORAZEPAM 2 MG/ML IJ SOLN
1.0000 mg | Freq: Once | INTRAMUSCULAR | Status: AC
  Administered 2012-02-03: 1 mg via INTRAVENOUS
  Administered 2012-02-03: 2 mg via INTRAVENOUS

## 2012-02-03 MED ORDER — HYDROCHLOROTHIAZIDE 12.5 MG PO CAPS
12.5000 mg | ORAL_CAPSULE | Freq: Every day | ORAL | Status: DC
  Administered 2012-02-03 – 2012-02-08 (×6): 12.5 mg via ORAL
  Filled 2012-02-03 (×6): qty 1

## 2012-02-03 MED ORDER — SODIUM CHLORIDE 0.9 % IV BOLUS (SEPSIS)
1000.0000 mL | Freq: Once | INTRAVENOUS | Status: AC
  Administered 2012-02-03: 1000 mL via INTRAVENOUS

## 2012-02-03 MED ORDER — VITAMIN B-1 100 MG PO TABS
100.0000 mg | ORAL_TABLET | Freq: Every day | ORAL | Status: DC
  Administered 2012-02-04 – 2012-02-08 (×5): 100 mg via ORAL
  Filled 2012-02-03 (×6): qty 1

## 2012-02-03 MED ORDER — SODIUM CHLORIDE 0.9 % IJ SOLN
3.0000 mL | Freq: Two times a day (BID) | INTRAMUSCULAR | Status: DC
  Administered 2012-02-03 – 2012-02-07 (×8): 3 mL via INTRAVENOUS

## 2012-02-03 MED ORDER — HEPARIN SODIUM (PORCINE) 5000 UNIT/ML IJ SOLN
5000.0000 [IU] | Freq: Three times a day (TID) | INTRAMUSCULAR | Status: DC
  Administered 2012-02-03 – 2012-02-04 (×3): 5000 [IU] via SUBCUTANEOUS
  Filled 2012-02-03 (×5): qty 1

## 2012-02-03 MED ORDER — DILTIAZEM LOAD VIA INFUSION
20.0000 mg | Freq: Once | INTRAVENOUS | Status: DC
  Filled 2012-02-03: qty 20

## 2012-02-03 MED ORDER — PANTOPRAZOLE SODIUM 40 MG IV SOLR
40.0000 mg | INTRAVENOUS | Status: DC
  Filled 2012-02-03: qty 40

## 2012-02-03 MED ORDER — ASPIRIN 81 MG PO TBEC: 81.0000 mg | DELAYED_RELEASE_TABLET | Freq: Every day | ORAL | Status: DC

## 2012-02-03 MED ORDER — LISINOPRIL-HYDROCHLOROTHIAZIDE 10-12.5 MG PO TABS: 1.0000 | ORAL_TABLET | Freq: Every day | ORAL | Status: DC

## 2012-02-03 MED ORDER — LORAZEPAM 2 MG/ML IJ SOLN
INTRAMUSCULAR | Status: AC
  Filled 2012-02-03: qty 1

## 2012-02-03 MED ORDER — LISINOPRIL 10 MG PO TABS
10.0000 mg | ORAL_TABLET | Freq: Every day | ORAL | Status: DC
  Administered 2012-02-03 – 2012-02-08 (×6): 10 mg via ORAL
  Filled 2012-02-03 (×6): qty 1

## 2012-02-03 MED ORDER — NIACIN ER 500 MG PO CPCR
1000.0000 mg | ORAL_CAPSULE | Freq: Every day | ORAL | Status: DC
Start: 2012-02-03 — End: 2012-02-08
  Administered 2012-02-03 – 2012-02-07 (×5): 1000 mg via ORAL
  Filled 2012-02-03 (×7): qty 2

## 2012-02-03 MED ORDER — ACETAMINOPHEN 650 MG RE SUPP: 650.0000 mg | Freq: Four times a day (QID) | RECTAL | Status: DC | PRN

## 2012-02-03 MED ORDER — FOLIC ACID 1 MG PO TABS
1.0000 mg | ORAL_TABLET | Freq: Every day | ORAL | Status: DC
  Administered 2012-02-04 – 2012-02-08 (×5): 1 mg via ORAL
  Filled 2012-02-03 (×5): qty 1

## 2012-02-03 MED ORDER — SODIUM CHLORIDE 0.9 % IV SOLN
500.0000 mg | Freq: Two times a day (BID) | INTRAVENOUS | Status: DC
  Administered 2012-02-03 – 2012-02-04 (×3): 500 mg via INTRAVENOUS
  Filled 2012-02-03 (×6): qty 5

## 2012-02-03 MED ORDER — METOPROLOL TARTRATE 1 MG/ML IV SOLN: 5.0000 mg | Freq: Two times a day (BID) | INTRAVENOUS | Status: DC

## 2012-02-03 MED ORDER — OXYBUTYNIN CHLORIDE 5 MG PO TABS
5.0000 mg | ORAL_TABLET | Freq: Three times a day (TID) | ORAL | Status: DC
  Administered 2012-02-03 – 2012-02-08 (×13): 5 mg via ORAL
  Filled 2012-02-03 (×16): qty 1

## 2012-02-03 MED ORDER — POTASSIUM CHLORIDE CRYS ER 20 MEQ PO TBCR
20.0000 meq | EXTENDED_RELEASE_TABLET | Freq: Every day | ORAL | Status: DC
  Administered 2012-02-03 – 2012-02-08 (×6): 20 meq via ORAL
  Filled 2012-02-03 (×6): qty 1

## 2012-02-03 MED ORDER — NIACIN ER (ANTIHYPERLIPIDEMIC) 1000 MG PO TBCR: 1000.0000 mg | EXTENDED_RELEASE_TABLET | Freq: Every day | ORAL | Status: DC

## 2012-02-03 MED ORDER — NITROGLYCERIN 0.4 MG SL SUBL: 0.4000 mg | SUBLINGUAL_TABLET | SUBLINGUAL | Status: DC | PRN

## 2012-02-03 MED ORDER — ONDANSETRON HCL 4 MG/2ML IJ SOLN
4.0000 mg | Freq: Four times a day (QID) | INTRAMUSCULAR | Status: DC | PRN
  Administered 2012-02-03 – 2012-02-05 (×2): 4 mg via INTRAVENOUS
  Filled 2012-02-03 (×2): qty 2

## 2012-02-03 MED ORDER — MAGNESIUM OXIDE 400 (241.3 MG) MG PO TABS
400.0000 mg | ORAL_TABLET | Freq: Every day | ORAL | Status: DC
  Administered 2012-02-03 – 2012-02-08 (×6): 400 mg via ORAL
  Filled 2012-02-03 (×6): qty 1

## 2012-02-03 MED ORDER — MAGNESIUM OXIDE 400 MG PO TABS: 400.0000 mg | ORAL_TABLET | Freq: Every day | ORAL | Status: DC

## 2012-02-03 MED ORDER — SODIUM CHLORIDE 0.9 % IV SOLN
1000.0000 mg | Freq: Once | INTRAVENOUS | Status: AC
  Administered 2012-02-03: 1000 mg via INTRAVENOUS
  Filled 2012-02-03: qty 20

## 2012-02-03 MED ORDER — METHOTREXATE 2.5 MG PO TABS
7.5000 mg | ORAL_TABLET | ORAL | Status: DC
  Administered 2012-02-04: 7.5 mg via ORAL
  Filled 2012-02-03: qty 3

## 2012-02-03 MED ORDER — TORSEMIDE 10 MG PO TABS
10.0000 mg | ORAL_TABLET | Freq: Every day | ORAL | Status: DC
  Administered 2012-02-03 – 2012-02-08 (×5): 10 mg via ORAL
  Filled 2012-02-03 (×7): qty 1

## 2012-02-03 MED ORDER — PREDNISONE 5 MG PO TABS
5.0000 mg | ORAL_TABLET | Freq: Every day | ORAL | Status: DC
  Administered 2012-02-04 – 2012-02-08 (×5): 5 mg via ORAL
  Filled 2012-02-03 (×7): qty 1

## 2012-02-03 MED ORDER — INSULIN ASPART 100 UNIT/ML ~~LOC~~ SOLN
0.0000 [IU] | Freq: Three times a day (TID) | SUBCUTANEOUS | Status: DC
  Administered 2012-02-05: 1 [IU] via SUBCUTANEOUS
  Administered 2012-02-06: 2 [IU] via SUBCUTANEOUS

## 2012-02-03 MED ORDER — TORSEMIDE 20 MG/2ML IV SOLN
5.0000 mg | Freq: Every day | INTRAVENOUS | Status: DC
  Filled 2012-02-03: qty 0.5

## 2012-02-03 MED ORDER — THIAMINE HCL 100 MG/ML IJ SOLN
Freq: Once | INTRAVENOUS | Status: AC
  Administered 2012-02-03: 22:00:00 via INTRAVENOUS
  Filled 2012-02-03: qty 1000

## 2012-02-03 MED ORDER — DIPHENOXYLATE-ATROPINE 2.5-0.025 MG PO TABS: 1.0000 | ORAL_TABLET | Freq: Four times a day (QID) | ORAL | Status: DC | PRN

## 2012-02-03 MED ORDER — ACETAMINOPHEN 325 MG PO TABS: 650.0000 mg | ORAL_TABLET | Freq: Four times a day (QID) | ORAL | Status: DC | PRN

## 2012-02-03 MED ORDER — ALPRAZOLAM 0.5 MG PO TABS
1.0000 mg | ORAL_TABLET | ORAL | Status: AC
  Administered 2012-02-03: 1 mg via ORAL
  Filled 2012-02-03: qty 2

## 2012-02-03 MED ORDER — LORAZEPAM 2 MG/ML IJ SOLN
INTRAMUSCULAR | Status: AC
  Administered 2012-02-03: 2 mg via INTRAVENOUS
  Filled 2012-02-03: qty 1

## 2012-02-03 MED ORDER — ASPIRIN EC 81 MG PO TBEC
81.0000 mg | DELAYED_RELEASE_TABLET | Freq: Every day | ORAL | Status: DC
  Administered 2012-02-03 – 2012-02-08 (×6): 81 mg via ORAL
  Filled 2012-02-03 (×6): qty 1

## 2012-02-03 MED ORDER — DILTIAZEM HCL 100 MG IV SOLR
5.0000 mg/h | INTRAVENOUS | Status: DC
  Filled 2012-02-03: qty 100

## 2012-02-03 MED ORDER — NICOTINE 14 MG/24HR TD PT24
14.0000 mg | MEDICATED_PATCH | Freq: Every day | TRANSDERMAL | Status: DC
  Administered 2012-02-03: 14 mg via TRANSDERMAL
  Filled 2012-02-03 (×2): qty 1

## 2012-02-03 MED ORDER — METOPROLOL TARTRATE 50 MG PO TABS
50.0000 mg | ORAL_TABLET | Freq: Two times a day (BID) | ORAL | Status: DC
  Administered 2012-02-03: 50 mg via ORAL
  Filled 2012-02-03: qty 1

## 2012-02-03 MED ORDER — ONDANSETRON HCL 4 MG PO TABS: 4.0000 mg | ORAL_TABLET | Freq: Four times a day (QID) | ORAL | Status: DC | PRN

## 2012-02-03 NOTE — ED Provider Notes (Signed)
History     CSN: [id number]  Arrival date & time 02/03/12  1130   First MD Initiated Contact with Patient 02/03/12 1135      Chief Complaint - fall   Patient is a 61 y.o. male presenting with fall. The history is provided by the patient and the EMS personnel.  Fall Incident onset: unknown time ago. The point of impact was the head. The pain is moderate. Pertinent negatives include no fever, no abdominal pain and no headaches. Exacerbated by: movement. Treatment on scene includes a c-collar and a backboard. He has tried rest and immobilization for the symptoms. The treatment provided mild relief.  Pt presents s/p fall He is unsure how he fell  Per EMS, they were called as patient was "found down" outside his home.  He was noted to have abrasion to forehead therefore level 2 trauma was called.  He was initially confused but is now more awake  Past Medical History  Diagnosis Date  . Rheumatoid arthritis   . Obesity   . Major depression     with acute psychotic break in 06/2010  . Diabetes mellitus   . Hypertension   . Hyperlipidemia   . Diverticulosis of colon   . COPD (chronic obstructive pulmonary disease)   . Anxiety   . GERD (gastroesophageal reflux disease)   . Vertigo   . Seizures   . Fibromyalgia   . Dermatomyositis   . Myocardial infarct     mulitple (1999, 2000, 2004)  . Raynaud's disease   . Narcotic dependence   . Peripheral neuropathy   . Internal hemorrhoids   . Ischemic heart disease   . Hiatal hernia   . Gastritis   . Diverticulitis   . Hx of adenomatous colonic polyps   . Nephrolithiasis   . Anemia   . Esophageal stricture   . Esophageal dysmotility   . Dermatomyositis   . Paroxysmal a-fib   . Urge incontinence   . Otosclerosis     Past Surgical History  Procedure Date  . Knee arthroscopy w/ meniscal repair 2009    left  . Lumbar disc surgery   . Squamous papilloma  2010    removed by Dr. Pollyann Kennedy ENT, noted on tongue    Family History   Problem Relation Age of Onset  . Alcohol abuse Mother   . Depression Mother   . Heart disease Mother   . Diabetes Mother   . Stroke Mother   . Diabetes Other     1/2 brother    History  Substance Use Topics  . Smoking status: Current Every Day Smoker -- 0.5 packs/day  . Smokeless tobacco: Not on file  . Alcohol Use: 0.6 oz/week    1 Glasses of wine per week     Comment: daily      Review of Systems  Constitutional: Negative for fever.  Respiratory: Negative for shortness of breath.   Cardiovascular: Negative for chest pain.  Gastrointestinal: Negative for abdominal pain.  Musculoskeletal: Positive for back pain.  Neurological: Negative for headaches.  All other systems reviewed and are negative.    Allergies  Betamethasone dipropionate; Bupropion hcl; Ciprofloxacin; Clobetasol; Codeine; Escitalopram oxalate; Fluoxetine hcl; Fluticasone-salmeterol; Furosemide; Immune globulins; Metronidazole; Paroxetine; Penicillins; Statins; Tacrolimus; Tetanus toxoid; and Tuberculin purified protein derivative  Home Medications   Current Outpatient Rx  Name  Route  Sig  Dispense  Refill  . IPRATROPIUM-ALBUTEROL 18-103 MCG/ACT IN AERO   Inhalation   Inhale 2 puffs into the lungs  every 4 (four) hours as needed. For shortness of breath/wheeze          . ALPRAZOLAM 1 MG PO TABS   Oral   Take 1 mg by mouth 3 (three) times daily as needed. For anxiety         . ASPIRIN 81 MG PO TBEC   Oral   Take 81 mg by mouth daily.           Marland Kitchen DICLOFENAC SODIUM 75 MG PO TBEC   Oral   Take 1 tablet (75 mg total) by mouth 2 (two) times daily as needed. For back pain   60 tablet   3   . DICYCLOMINE HCL 20 MG PO TABS   Oral   Take 1 tablet (20 mg total) by mouth 2 (two) times daily.   60 tablet   1   . DIPHENOXYLATE-ATROPINE 2.5-0.025 MG PO TABS   Oral   Take 1 tablet by mouth 4 (four) times daily as needed for diarrhea or loose stools.   90 tablet   5   . DULOXETINE HCL 60 MG PO  CPEP   Oral   Take 60 mg by mouth 2 (two) times daily.          Marland Kitchen ESOMEPRAZOLE MAGNESIUM 40 MG PO CPDR   Oral   Take 1 capsule (40 mg total) by mouth daily before breakfast.   30 capsule   1   . FOLIC ACID 1 MG PO TABS   Oral   Take 1 mg by mouth as directed. One tab by mouth on days not taking methotrexate          . GLIPIZIDE ER 5 MG PO TB24   Oral   Take 5 mg by mouth daily.         Marland Kitchen LISINOPRIL-HYDROCHLOROTHIAZIDE 10-12.5 MG PO TABS   Oral   Take 1 tablet by mouth daily.   30 tablet   11   . MAGNESIUM OXIDE 400 MG PO TABS   Oral   Take 400 mg by mouth daily.         Marland Kitchen METHOTREXATE 2.5 MG PO TABS   Oral   Take 3 tablets (7.5 mg total) by mouth once a week.   15 tablet   0   . METOPROLOL TARTRATE 50 MG PO TABS   Oral   Take 1 tablet (50 mg total) by mouth 2 (two) times daily.   60 tablet   3   . NIACIN ER (ANTIHYPERLIPIDEMIC) 1000 MG PO TBCR   Oral   Take 1,000 mg by mouth at bedtime.         Marland Kitchen NITROGLYCERIN 0.4 MG SL SUBL   Sublingual   Place 0.4 mg under the tongue every 5 (five) minutes as needed. For chest pain         . OXYBUTYNIN CHLORIDE 5 MG PO TABS   Oral   Take 1 tablet (5 mg total) by mouth 3 (three) times daily.   90 tablet   0   . POTASSIUM CHLORIDE CRYS ER 20 MEQ PO TBCR   Oral   Take 1 tablet (20 mEq total) by mouth daily.   30 tablet   0     Make office visit within one month for check.   Marland Kitchen PREDNISONE 5 MG PO TABS   Oral   Take 1 tablet (5 mg total) by mouth daily.   30 tablet   5   . ROPINIROLE HCL 2 MG  PO TABS   Oral   Take 2 mg by mouth 3 (three) times daily as needed. For tremors         . VITAMIN B-1 100 MG PO TABS   Oral   Take 100 mg by mouth daily.         . TORSEMIDE 10 MG PO TABS   Oral   Take 1 tablet (10 mg total) by mouth daily.   30 tablet   0     Needs doctor visit prior to refills   . GABAPENTIN 600 MG PO TABS   Oral   Take 1 tablet (600 mg total) by mouth 3 (three) times daily.    90 tablet   0    BP 137/84  Pulse 65  Temp 98.7 F (37.1 C) (Oral)  Resp 16  SpO2 97%  Physical Exam CONSTITUTIONAL: Well developed/well nourished HEAD AND FACE:forehead abrasion.  No other signs of trauma EYES: EOMI/PERRL ENMT: Mucous membranes moist, poor dentition, No evidence of facial/nasal trauma NECK:c-collar in place SPINE:lumbar tenderness.  No cervical or thoracic tenderness. No bruising/crepitance/stepoffs noted to spine Patient maintained in spinal precautions/logroll utilized CV: S1/S2 noted, no murmurs/rubs/gallops noted LUNGS: Lungs are clear to auscultation bilaterally, no apparent distress ABDOMEN: soft, nontender, no rebound or guarding GU:no cva tenderness but there is abrasion to his flank NEURO: Pt is awake/alert, moves all extremitiesx4.  He is oriented to person/place/time.  He does appear to have mild expressive aphasia.   EXTREMITIES: pulses normal, full ROM, All extremities/joints palpated/ranged and nontender SKIN: warm, color normal PSYCH: no abnormalities of mood noted  ED Course  Procedures   CRITICAL CARE Performed by: Joya Gaskins   Total critical care time: 35  Critical care time was exclusive of separately billable procedures and treating other patients.  Critical care was necessary to treat or prevent imminent or life-threatening deterioration.  Critical care was time spent personally by me on the following activities: development of treatment plan with patient and/or surrogate as well as nursing, discussions with consultants, evaluation of patient's response to treatment, examination of patient, obtaining history from patient or surrogate, ordering and performing treatments and interventions, ordering and review of laboratory studies, ordering and review of radiographic studies, pulse oximetry and re-evaluation of patient's condition.   Labs Reviewed  GLUCOSE, CAPILLARY - Abnormal; Notable for the following:    Glucose-Capillary  133 (*)     All other components within normal limits  PROTIME-INR  CBC  DIFFERENTIAL  COMPREHENSIVE METABOLIC PANEL  URINE RAPID DRUG SCREEN (HOSP PERFORMED)  TROPONIN I  URINALYSIS, ROUTINE W REFLEX MICROSCOPIC  ETHANOL   11:57 AM Level 2 trauma cancelled after initial evaluation Pt seen on arrival, awake/alert but then proceeded to have tonic clonic sz while in the ED.  He was given ativan and PTN load ordered.  He had brief episode of hypoxia that improved with oxygen.  Will follow closely.   Per nursing he had brief seizure prior to ct imaging   1:52 PM Ct imaging negative for acute intracranial process He is awake/alert currently He has no focal neuro deficits but he is slow to speak, unclear if this is acute in onset Unclear cause of fall earlier today but he may have had seizure that caused fall (EMS reports he was confused on their arrival) D/w [facility], will admit to tele Awake/alert at this time He is not on AED therapy, but does admit to etoh use.  Could be etoh withdrawal seizure.    3:58  PM  While in the ED, he did fall out of bed while trying to use urinal but no LOC and no injury noted.  No evidence of spinal injury or head injury    MDM  Nursing notes including past medical history and social history reviewed and considered in documentation xrays reviewed and considered Previous records reviewed and considered - previous ED visits reviewed Labs/vital reviewed and considered        Date: 02/03/2012  Rate: 65  Rhythm: normal sinus rhythm  QRS Axis: normal  Intervals: normal  ST/T Wave abnormalities: nonspecific ST changes  Conduction Disutrbances:none    Joya Gaskins, MD 02/03/12 1559

## 2012-02-03 NOTE — ED Notes (Signed)
Patient heard calling for help and found on floor of room in sitting position beside bed, patient was attempting to ambulate to bedside table for urinal use, patient with no apparent injuries, Dr. Bebe Shaggy notified and exam performed, no injuries from fall, patient placed into bed and education reinforced on call bell apparatus and notification procedures for assistance, patient in NAD post fall.

## 2012-02-03 NOTE — H&P (Signed)
Family Medicine Teaching [facility] Admission History and Physical Service Pager: [phone]  Patient name: Patrick Holt Medical record number: [id number] Date of birth: [birth year] Age: 61 y.o. Gender: male  Primary Care Provider: Default, Provider, MD  Chief Complaint: Fall, altered mental status   Assessment and Plan: Patrick Holt is a 61 y.o. year old male  with h/o RA, Depression, HTN, diverticulosis/itis, HLD, COPD, Reflux, Seizures, Fibromyalgia, Dermatomyositis, MIO, raynaud's, hiatal hernia, neprholithiasis, esophageal dysmotility, paroxysmal A-fib, and otosclerosis presenting w/  fall and likely seizure.  1. Fall, with witnessed tonic clonic seizure- Pt presented with fall.  CT scan negative in ED. Multiple labs done on admission and loaded with dilantin and one time dose of Ativan. Unsure of etiology. Pt w/ known seizure disorder but w/o seizure for >20yr. No evidence of infection or metabolic derangement. Las ativan and percocet ~2wks ago and drinks 1 glass of wine nightly. Cardiac source of fall possible as q/ h/o paroxysmal A-fib and w/ h/o incontinence, but unlikely.  1. Will admit to telemetry, vitals per unit, neuro checks  2. NPO until bedside swallow study.  Oral meds ordered but not to be given until passes 3. Seizure precautions, aspiration precautions, and bedrest.  If becomes combative, will need restraints and possible Haldol onboard 4. Will continue antiepileptic, keppra 500 mg IV BID.   5. For possible alcohol withdrawal, CIWA protocol, IV fluids with Thiamine, Folate, and D5.  When able to tolerate PO, will switch back to oral B1 and Folate and KVO pt 6. Recheck BMP, CBC, and EKG in AM.  Will also get Salicylate level and Acetaminophen levels.  7. PT/OT evaluation when clinically improved  8. May need inpatient psychology consult.  Consider if no improvement or unable to obtain records from previous doctor.  2. Psych: Pt w/ no formal diagnoses but w/  apparent Psych history. Pt seen by Saul Fordyce, NP [facility]).  Called practice to obtain information about diagnosis and treatment and was informed that pt had been fired from [employer].  If does not return page, will need to get in contact with the practice tomorrow. Pt w/ significant tangential thinking and likely confabulation 1. Monitor for acute psychosis . W/ Haldol prn 2. F/u w/ psych office in the am 3. DM II - Will get A1C on patient today 1. SSI sensitive.  No basal insulin 2. CBG with meals and QHS, if elevated, increase SSI 4. HTN - BP on admission 135/84 1. Will continue home meds including Metoprolol and Zestoretic 2. Metoprolol IV until passes swallow eval 5. Dermatomyositis - Stable, no clinical evidence 1. Continue home Prednisone 5 mg qd 6. Rheumatoid Arthritis - Stable 1. Continue home MTX 7. Ischemic Cardiomyopathy, hx of MI x 2- Unsure of baseline. EKG w/o evidenc of MI at this time. Complained of palpitations prior to seizure this afternoon 1. Continue ASA and BB 2. Troponins x3 3. Strict I/O and daily weights  8. SKin wound: Pt states wound was obtained back in September. Wound is non-painful to palpation and w/ scab formation in center w/ some contracture of skin edges. No evidence of infection at this time though concerning for poor wound healing as wound should have healed if truly 2-3 months old. Albumin normal. 1. Wound care 9. Tobacco Abuse - Smokes about  1/2 ppd for unstated length 1. Start Nicotine patches for replacement 10. Bowel and Bladder Incontinence with hx of IBS- States it is chronic 1. Will continue home meds including  Oxybutynin, Lomotil, Mag Ox, Bentyl  11. Hx of COPD - No evidence of exacerbation.  1.  Continue Combivent 2. O2 PRN to keep O2 > 92% 12. FEN/GI: NPO until passes swallow study.   1. D5 NS 75 cc/hr with thiamine and folate. 2. Zofran PRN for nausea.  13. Prophylaxis: Heparin SQ 14. Disposition:  Telemetry pending further w/u of AMS 15. Code Status: Unable to obtain. Will need to discuss if has capacity.   History of Present Illness: Patrick Holt is a 61 y.o. year old male presenting with a fall.  Pt is slightly confused and hard to obtain a thorough history from.  Pt states he was in his normal state of health until this morning.  He was drying dishes and doing his wash, when he went down to the end of his handicap ramp outside to get his mail and started having palpitations in his arm along with his chest, dizziness, nausea and at this point does not remember anything believing he passed out.  He is unsure of whether he lost his bladder or bowel during this episode, but believes he did lose his bladder.  A neighbor witnessed him fall out of his wheelchair, hit his head, and started having a tonic clonic seizure.  EMS was called and placed him in a C-collar and immobilization before bringing him to the ED and this is the point where he started to be cognizant of his surroundings again.   Pt was brought to the ED where he had an extensive workup for trauma.  A CT scan of his head was negative for acute process, a BMP showing a K+ of 3.1 but otherwise normal, a troponin negative x 1, Leukocytosis to 13.5 and MCV of 106.4, UA >300 protein, CXR without acute findings, EKG unchanged from last EKG, UDS negative, EtOH <11.  Pt was witnessed to have a tonic clonic seizure in the trauma bay and had one dose of Ativan and a loading dose of Dilantin.    Pt states he has a PMHx of dermatomyositis, multiple MI's, and autoimmune conditions that he takes many medications for these conditions.   He does have a history of LBP recently sent to ortho where they recommended a spinal fusion, which pt was not amenable to and was sent to pain management instead.   Pt does have a remote history of seizure, unsure of his last episode.    Pt c/o loss of bowel and bladder in a chronic condition.  Negative for recent  fevers, chills, sweats, weakness, fatigue, abdominal pain.    Has not been taking his medications as he has run out.  Has not taken Xanax for about 2 weeks, Percocet for several weeks, and denies large volumes on alcohol use.  Does smoke 1/2 to 2 ppd because "doctors have told him to smoke and he will die if he does not smoke".    Patient Active Problem List  Diagnosis  . DIABETES MELLITUS, TYPE II  . HYPERLIPIDEMIA  . OBESITY, UNSPECIFIED  . DEPRESSION, MAJOR  . ANXIETY, CHRONIC  . TOBACCO USER  . HYPERTENSION  . ISCHEMIC HEART DISEASE  . RAYNAUD'S DISEASE  . HEMORRHOIDS, INTERNAL  . COPD  . ESOPHAGEAL MOTILITY DISORDER  . GERD  . HIATAL HERNIA  . DIVERTICULOSIS OF COLON  . Dermatomyositis  . RHEUMATOID ARTHRITIS  . FIBROMYALGIA  . VERTIGO  . Generalized pain  . TREMOR, RIGHT HAND  . Urge incontinence  . Irritable bowel syndrome (IBS)  .  History of seizure disorder  . Radicular low back pain  . Ear pain  . Dental attrition, excessive  . Fungal infection  . High risk social situation   Past Medical History: Past Medical History  Diagnosis Date  . Rheumatoid arthritis   . Obesity   . Major depression     with acute psychotic break in 06/2010  . Diabetes mellitus   . Hypertension   . Hyperlipidemia   . Diverticulosis of colon   . COPD (chronic obstructive pulmonary disease)   . Anxiety   . GERD (gastroesophageal reflux disease)   . Vertigo   . Seizures   . Fibromyalgia   . Dermatomyositis   . Myocardial infarct     mulitple (1999, 2000, 2004)  . Raynaud's disease   . Narcotic dependence   . Peripheral neuropathy   . Internal hemorrhoids   . Ischemic heart disease   . Hiatal hernia   . Gastritis   . Diverticulitis   . Hx of adenomatous colonic polyps   . Nephrolithiasis   . Anemia   . Esophageal stricture   . Esophageal dysmotility   . Dermatomyositis   . Paroxysmal a-fib   . Urge incontinence   . Otosclerosis    Past Surgical History: Past  Surgical History  Procedure Date  . Knee arthroscopy w/ meniscal repair 2009    left  . Lumbar disc surgery   . Squamous papilloma  2010    removed by Dr. Pollyann Kennedy ENT, noted on tongue   Social History: History  Substance Use Topics  . Smoking status: Current Every Day Smoker -- 0.5 packs/day  . Smokeless tobacco: Not on file  . Alcohol Use: 0.6 oz/week    1 Glasses of wine per week     Comment: daily   For any additional social history documentation, please refer to relevant sections of EMR.  Family History: Family History  Problem Relation Age of Onset  . Alcohol abuse Mother   . Depression Mother   . Heart disease Mother   . Diabetes Mother   . Stroke Mother   . Diabetes Other     1/2 brother   Allergies: Allergies  Allergen Reactions  . Betamethasone Dipropionate     Unknown  . Bupropion Hcl     Unknown  . Ciprofloxacin     REACTION: swelling  . Clobetasol     Unknown  . Codeine     Unknown  . Escitalopram Oxalate     Unknown  . Fluoxetine Hcl     Unknown  . Fluticasone-Salmeterol     Unknown  . Furosemide     Unknown  . Immune Globulins     Acute renal failure  . Metronidazole     REACTION: swelling  . Paroxetine     Unknown  . Penicillins     Unknown  . Statins     Unknown  . Tacrolimus     Unknown  . Tetanus Toxoid     Unknown  . Tuberculin Purified Protein Derivative     Unknown   No current facility-administered medications on file prior to encounter.   Current Outpatient Prescriptions on File Prior to Encounter  Medication Sig Dispense Refill  . albuterol-ipratropium (COMBIVENT) 18-103 MCG/ACT inhaler Inhale 2 puffs into the lungs every 4 (four) hours as needed. For shortness of breath/wheeze       . ALPRAZolam (XANAX) 1 MG tablet Take 1 mg by mouth 3 (three) times daily as needed. For anxiety      .  aspirin (ASPIR-81) 81 MG EC tablet Take 81 mg by mouth daily.        . diclofenac (VOLTAREN) 75 MG EC tablet Take 1 tablet (75 mg total)  by mouth 2 (two) times daily as needed. For back pain  60 tablet  3  . dicyclomine (BENTYL) 20 MG tablet Take 1 tablet (20 mg total) by mouth 2 (two) times daily.  60 tablet  1  . diphenoxylate-atropine (LOMOTIL) 2.5-0.025 MG per tablet Take 1 tablet by mouth 4 (four) times daily as needed for diarrhea or loose stools.  90 tablet  5  . DULoxetine (CYMBALTA) 60 MG capsule Take 60 mg by mouth 2 (two) times daily.       Marland Kitchen esomeprazole (NEXIUM) 40 MG capsule Take 1 capsule (40 mg total) by mouth daily before breakfast.  30 capsule  1  . folic acid (FOLVITE) 1 MG tablet Take 1 mg by mouth as directed. One tab by mouth on days not taking methotrexate       . lisinopril-hydrochlorothiazide (PRINZIDE,ZESTORETIC) 10-12.5 MG per tablet Take 1 tablet by mouth daily.  30 tablet  11  . methotrexate (RHEUMATREX) 2.5 MG tablet Take 3 tablets (7.5 mg total) by mouth once a week.  15 tablet  0  . metoprolol (LOPRESSOR) 50 MG tablet Take 1 tablet (50 mg total) by mouth 2 (two) times daily.  60 tablet  3  . nitroGLYCERIN (NITROSTAT) 0.4 MG SL tablet Place 0.4 mg under the tongue every 5 (five) minutes as needed. For chest pain      . oxybutynin (DITROPAN) 5 MG tablet Take 1 tablet (5 mg total) by mouth 3 (three) times daily.  90 tablet  0  . potassium chloride SA (K-DUR,KLOR-CON) 20 MEQ tablet Take 1 tablet (20 mEq total) by mouth daily.  30 tablet  0  . predniSONE (DELTASONE) 5 MG tablet Take 1 tablet (5 mg total) by mouth daily.  30 tablet  5  . rOPINIRole (REQUIP) 2 MG tablet Take 2 mg by mouth 3 (three) times daily as needed. For tremors      . torsemide (DEMADEX) 10 MG tablet Take 1 tablet (10 mg total) by mouth daily.  30 tablet  0  . gabapentin (NEURONTIN) 600 MG tablet Take 1 tablet (600 mg total) by mouth 3 (three) times daily.  90 tablet  0  . [DISCONTINUED] oxybutynin (DITROPAN) 5 MG tablet Take 1 tablet (5 mg total) by mouth 3 (three) times daily.  90 tablet  3   Review Of Systems: Per HPI  Physical  Exam: BP 137/83  Pulse 56  Temp 98.7 F (37.1 C) (Oral)  Resp 16  SpO2 96% Exam: General: In mild distress, two episodes of clear emesis while in the room , Disheveled, Inappropriate affect including urinating into beside urinal and accidentally pouring onto himself  HEENT: EOMI B/L, MMM, poor dentia  Cardiovascular: RRR, S1/S2 present, no murmurs appreciated  Respiratory: CTA B/L, no wheezes, no respiratory distress Abdomen: Soft, NT/ND, NABS Extremities: + Ulnar deviation of fingers,   Skin: Multiple small lacerations on extremities, + plaques B/L elbows, +1 pitting Edema B/L , LE venous dermatitis, + 2 x 2 cm non-purulent, non-painful healing wound R proximal tibia. Neuro: CN 2-12 intact, no focal neurologic deficits  Vascular: +1 pedal pulses B/L  Psych: Alert, States correct year, Believes it's the 13th of November, Tangential Speech, Insight Lacking  Labs and Imaging: CBC BMET   Lab 02/03/12 1143  WBC 13.5*  HGB 15.3  HCT 46.7  PLT 277   Drugs of Abuse     Component Value Date/Time   LABOPIA NONE DETECTED 02/03/2012 1301   COCAINSCRNUR NONE DETECTED 02/03/2012 1301   LABBENZ NONE DETECTED 02/03/2012 1301   AMPHETMU NONE DETECTED 02/03/2012 1301   THCU NONE DETECTED 02/03/2012 1301   LABBARB NONE DETECTED 02/03/2012 1301      Lab 02/03/12 1143  NA 140  K 3.1*  CL 99  CO2 22  BUN 8  CREATININE 0.81  GLUCOSE 111*  CALCIUM 9.0     Hess, Bryan, DO  02/03/2012, 4:04 PM  --------------------------------------------------------------------------------------------------------------------------------------------------------------------------------------------------------- PT SEEN WITH DR HESS AND AGREE W/ ASSESSMENT & PLAN  UPPER LEVEL ADDENDUM IN BLUE  Shelly Flatten, MD Family Medicine PGY-2 02/03/2012, 6:02 PM  I examined this patient and discussed the care plan with Konrad Dolores and the FPTS team and agree with assessment and plan as documented in the  admission note above. Currently he is alert and complains of not being offered food or drink although they are sitting in front of him on his tray. He elaborates prolifically on details in responses to questions making it difficult to complete an interview. He currently is in NSR at a rate of 64. The right lower leg wound is full thickness and could be treated with a hydrocolloid dressing which would moisten it and prevent damage from hydrogen peroxide and other cytotoxic treatments that he has been applying.

## 2012-02-03 NOTE — Progress Notes (Signed)
Patient transferred to [id number] via bed with telemetry monitoring. All belongings transferred with patient.

## 2012-02-03 NOTE — Progress Notes (Addendum)
MT notified this RN of patient HR in the 150's with rhythm changing between Afib to Aflutter and back to NSR in the 70's. BP 137/74. Patient alert and conversant. MD notified. MD came to assess patient. Patient's HR continued to be very irregular ranging between 60 and 150 with rhythms changing frequently. Order to transfer patient to a unit where cardizem drip can be administered ([facility]).

## 2012-02-03 NOTE — ED Notes (Signed)
Patient resting more comfortably at this time, patient resting with eyes closed, easily arousable

## 2012-02-03 NOTE — Progress Notes (Signed)
Report called to Chris, RN

## 2012-02-03 NOTE — Progress Notes (Signed)
Family Practice Teaching Service Interim Progress Note  Called by RN after patient became tachycardic on heart rate on monitor, consistent with atrial fibrillation with RVR. BP stable at 137/74. Ordered stat EKG, and scheduled PO metoprolol dose to be given now. Was called again shortly thereafter by RN who reported that patient's heart rate continued to fluctuate up and down on the monitor. Went to see patient with upper level Dr. Tye Savoy.   Patient was in no acute distress, and sleeping comfortably when we first arrived. He awoke to voice and firm touch. He immediately began talking extensively about how he has been out of his xanax and percocet and needs to have these medicines. He endorsed feeling like his chest and arm was fluttering and also some slight discomfort in his chest. [name] this was how he felt before he came in to the hospital. He was also asking multiple times how to dial out on the phone, saying he needed to talk to a family member.  Exam: Gen: NAD, talking fairly rapidly in a monologue to MDs and RN Heart: irregularly irregular rhythm with auscultation and with palpation of radial pulse Pulm: normal respiratory effort Abd: soft, nontender, nondistended Ext: nontender to palpation Neuro: oriented to place and time, speech intact, moving all extremities  Telemetry showing varying heart rate in 130s, 140s, then falling down briefly to 68 when beat to beat variation is prolonged. EKG shows normal sinus rhythm with frequent PVCs. Had planned to transfer patient to stepdown, but no beds are available. Will plan to start patient on a diltiazem drip for rate control.  Levert Feinstein, MD Family Medicine PGY-1

## 2012-02-03 NOTE — Progress Notes (Signed)
Patient inducing vomiting when he got on the floor saying, 'He has not eaten the whole day". MD notified about it because of bedside swallow eval by SLP.,ok for patient to eat if tolerating po's said MD.

## 2012-02-03 NOTE — ED Notes (Addendum)
Patient arrived

## 2012-02-03 NOTE — ED Notes (Signed)
Patient with seizure symptoms, supplemental O2 given to patient, Dr. Bebe Shaggy notified

## 2012-02-04 ENCOUNTER — Inpatient Hospital Stay (HOSPITAL_COMMUNITY): Payer: Medicare HMO

## 2012-02-04 ENCOUNTER — Encounter (HOSPITAL_COMMUNITY): Payer: Self-pay | Admitting: Radiology

## 2012-02-04 DIAGNOSIS — L539 Erythematous condition, unspecified: Secondary | ICD-10-CM

## 2012-02-04 DIAGNOSIS — M069 Rheumatoid arthritis, unspecified: Secondary | ICD-10-CM

## 2012-02-04 DIAGNOSIS — I4891 Unspecified atrial fibrillation: Secondary | ICD-10-CM

## 2012-02-04 DIAGNOSIS — S065X9A Traumatic subdural hemorrhage with loss of consciousness of unspecified duration, initial encounter: Secondary | ICD-10-CM

## 2012-02-04 DIAGNOSIS — I259 Chronic ischemic heart disease, unspecified: Secondary | ICD-10-CM

## 2012-02-04 DIAGNOSIS — M3390 Dermatopolymyositis, unspecified, organ involvement unspecified: Secondary | ICD-10-CM

## 2012-02-04 DIAGNOSIS — R569 Unspecified convulsions: Secondary | ICD-10-CM

## 2012-02-04 LAB — LACTIC ACID, PLASMA: Lactic Acid, Venous: 1.5 mmol/L (ref 0.5–2.2)

## 2012-02-04 LAB — BASIC METABOLIC PANEL
BUN: 4 mg/dL — ABNORMAL LOW (ref 6–23)
BUN: 5 mg/dL — ABNORMAL LOW (ref 6–23)
Calcium: 8.7 mg/dL (ref 8.4–10.5)
Calcium: 9.3 mg/dL (ref 8.4–10.5)
Creatinine, Ser: 0.71 mg/dL (ref 0.50–1.35)
GFR calc Af Amer: >90 mL/min (ref >90)
GFR calc non Af Amer: >90 mL/min (ref >90)
GFR calc non Af Amer: >90 mL/min (ref >90)
Glucose, Bld: 105 mg/dL — ABNORMAL HIGH (ref 70–99)
Glucose, Bld: 118 mg/dL — ABNORMAL HIGH (ref 70–99)

## 2012-02-04 LAB — TSH: TSH: 0.398 u[IU]/mL (ref 0.350–4.500)

## 2012-02-04 LAB — CBC
Hemoglobin: 15 g/dL (ref 13.0–17.0)
MCH: 34.9 pg — ABNORMAL HIGH (ref 26.0–34.0)
MCHC: 33.6 g/dL (ref 30.0–36.0)
MCV: 103.7 fL — ABNORMAL HIGH (ref 78.0–100.0)
Platelets: 240 10*3/uL (ref 150–400)

## 2012-02-04 LAB — POCT I-STAT 3, ART BLOOD GAS (G3+)
Acid-Base Excess: 6 mmol/L — ABNORMAL HIGH (ref 0.0–2.0)
Bicarbonate: 29.9 mEq/L — ABNORMAL HIGH (ref 20.0–24.0)
O2 Saturation: 70 %
O2 Saturation: 95 %
TCO2: 31 mmol/L (ref 0–100)
pO2, Arterial: 34 mmHg — CL (ref 80.0–100.0)

## 2012-02-04 LAB — GLUCOSE, CAPILLARY
Glucose-Capillary: 113 mg/dL — ABNORMAL HIGH (ref 70–99)
Glucose-Capillary: 194 mg/dL — ABNORMAL HIGH (ref 70–99)

## 2012-02-04 LAB — MRSA PCR SCREENING: MRSA by PCR: NEGATIVE

## 2012-02-04 MED ORDER — AMIODARONE LOAD VIA INFUSION
150.0000 mg | Freq: Once | INTRAVENOUS | Status: DC
  Filled 2012-02-04: qty 83.34

## 2012-02-04 MED ORDER — AMIODARONE HCL IN DEXTROSE 360-4.14 MG/200ML-% IV SOLN
INTRAVENOUS | Status: AC
  Filled 2012-02-04: qty 200

## 2012-02-04 MED ORDER — HALOPERIDOL LACTATE 5 MG/ML IJ SOLN
5.0000 mg | Freq: Once | INTRAMUSCULAR | Status: AC
  Administered 2012-02-04: 5 mg via INTRAVENOUS

## 2012-02-04 MED ORDER — DOXYCYCLINE HYCLATE 100 MG IV SOLR
100.0000 mg | INTRAVENOUS | Status: AC
  Administered 2012-02-04: 100 mg via INTRAVENOUS
  Filled 2012-02-04: qty 100

## 2012-02-04 MED ORDER — POTASSIUM CHLORIDE CRYS ER 20 MEQ PO TBCR
40.0000 meq | EXTENDED_RELEASE_TABLET | Freq: Once | ORAL | Status: AC
  Administered 2012-02-04: 40 meq via ORAL
  Filled 2012-02-04: qty 2

## 2012-02-04 MED ORDER — AMIODARONE HCL IN DEXTROSE 360-4.14 MG/200ML-% IV SOLN
60.0000 mg/h | INTRAVENOUS | Status: AC
  Administered 2012-02-04 – 2012-02-05 (×2): 60 mg/h via INTRAVENOUS
  Filled 2012-02-04: qty 200

## 2012-02-04 MED ORDER — POTASSIUM CHLORIDE CRYS ER 20 MEQ PO TBCR: 40.0000 meq | EXTENDED_RELEASE_TABLET | Freq: Two times a day (BID) | ORAL | Status: DC

## 2012-02-04 MED ORDER — SODIUM CHLORIDE 0.9 % IV SOLN
1000.0000 mg | Freq: Once | INTRAVENOUS | Status: DC
  Filled 2012-02-04: qty 20

## 2012-02-04 MED ORDER — AMIODARONE HCL IN DEXTROSE 360-4.14 MG/200ML-% IV SOLN
60.0000 mg/h | INTRAVENOUS | Status: DC
  Administered 2012-02-05 (×5): 60 mg/h via INTRAVENOUS
  Administered 2012-02-05 – 2012-02-06 (×2): 30 mg/h via INTRAVENOUS
  Administered 2012-02-06: 60 mg/h via INTRAVENOUS
  Administered 2012-02-06: 30 mg/h via INTRAVENOUS
  Administered 2012-02-06 – 2012-02-07 (×3): 60 mg/h via INTRAVENOUS
  Filled 2012-02-04 (×20): qty 200

## 2012-02-04 MED ORDER — DOXYCYCLINE HYCLATE 100 MG IV SOLR
100.0000 mg | Freq: Two times a day (BID) | INTRAVENOUS | Status: DC
  Administered 2012-02-04 – 2012-02-05 (×3): 100 mg via INTRAVENOUS
  Filled 2012-02-04 (×4): qty 100

## 2012-02-04 MED ORDER — NICOTINE 21 MG/24HR TD PT24
21.0000 mg | MEDICATED_PATCH | Freq: Every day | TRANSDERMAL | Status: DC
  Filled 2012-02-04 (×4): qty 1

## 2012-02-04 MED ORDER — SODIUM CHLORIDE 0.9 % IV SOLN
1500.0000 mg | Freq: Once | INTRAVENOUS | Status: AC
  Administered 2012-02-04: 1500 mg via INTRAVENOUS
  Filled 2012-02-04: qty 15

## 2012-02-04 MED ORDER — POTASSIUM CHLORIDE 20 MEQ PO PACK: 40.0000 meq | PACK | Freq: Once | ORAL | Status: DC

## 2012-02-04 MED ORDER — COLLAGENASE 250 UNIT/GM EX OINT
TOPICAL_OINTMENT | Freq: Every day | CUTANEOUS | Status: DC
  Administered 2012-02-04 – 2012-02-08 (×5): via TOPICAL
  Filled 2012-02-04: qty 30

## 2012-02-04 MED ORDER — DILTIAZEM HCL 100 MG IV SOLR
5.0000 mg/h | INTRAVENOUS | Status: DC
  Filled 2012-02-04: qty 100

## 2012-02-04 MED ORDER — HALOPERIDOL LACTATE 5 MG/ML IJ SOLN
INTRAMUSCULAR | Status: AC
  Administered 2012-02-04: 5 mg
  Filled 2012-02-04: qty 1

## 2012-02-04 NOTE — Consult Note (Signed)
Patient ID: RISHARD DELANGE MRN: [id number], DOB/AGE: 62-05-1950   Admit date: 02/03/2012 Date of Consult: @TODAY @  Primary Cardiologist: New (seen once in consult by D Shirlee Latch 07/16/09 for rapid afib)    Problem List: Past Medical History  Diagnosis Date  . Rheumatoid arthritis   . Obesity   . Major depression     with acute psychotic break in 06/2010  . Diabetes mellitus   . Hypertension   . Hyperlipidemia   . Diverticulosis of colon   . COPD (chronic obstructive pulmonary disease)   . Anxiety   . GERD (gastroesophageal reflux disease)   . Vertigo   . Seizures   . Fibromyalgia   . Dermatomyositis   . Myocardial infarct     mulitple (1999, 2000, 2004)  . Raynaud's disease   . Narcotic dependence   . Peripheral neuropathy   . Internal hemorrhoids   . Ischemic heart disease   . Hiatal hernia   . Gastritis   . Diverticulitis   . Hx of adenomatous colonic polyps   . Nephrolithiasis   . Anemia   . Esophageal stricture   . Esophageal dysmotility   . Dermatomyositis   . Paroxysmal a-fib   . Urge incontinence   . Otosclerosis     Past Surgical History  Procedure Date  . Knee arthroscopy w/ meniscal repair 2009    left  . Lumbar disc surgery   . Squamous papilloma  2010    removed by Dr. Pollyann Kennedy ENT, noted on tongue     Allergies:  Allergies  Allergen Reactions  . Betamethasone Dipropionate     Unknown  . Bupropion Hcl     Unknown  . Ciprofloxacin     REACTION: swelling  . Clobetasol     Unknown  . Codeine     Unknown  . Escitalopram Oxalate     Unknown  . Fluoxetine Hcl     Unknown  . Fluticasone-Salmeterol     Unknown  . Furosemide     Unknown  . Immune Globulins     Acute renal failure  . Metronidazole     REACTION: swelling  . Paroxetine     Unknown  . Penicillins     Unknown  . Statins     Unknown  . Tacrolimus     Unknown  . Tetanus Toxoid     Unknown  . Tuberculin Purified Protein Derivative     Unknown    HPI:   Patient is a 61  yo who we are asked to see re slow and fast heart rates. The patient was admitted on 12/19  History of  CAD (s/p MI x3 - 1999, 2000, 2004  No records) seizures, COPD, DM, paroxysmal afib, dermatomyositis, RA, HTN,.  Witnesses was a tonic clonic seizure.  (last seizure over 1 year ago)   Patient was going down ramp to get mail and began having palpitations.  Became dizzy and nauseated.  Then didn't remember.  Thought he passed out.  Neighbor saw him fall out of wheelchair, hit head, had seizure like activity.    IN ER K was 3.1.  WBC increased.    On 12/19 patient found to have afib with RVR on monitor.  BP was 137/74.   Patient had been sleeping.   Patient converted to SR.  Dilt gtt started  ON 12/19 call because of ? seizure like activity.  Patient in NSR at time.  Dilt was d/c'd   Neuro was consulted.  Not  convinced of seizure  Felt due to oversedation. Patient transferred to ICU for frequent neuro checks.    Patient is poor historian but denies CP  Say he is dizzy at times.  Remembers palpittions.  Doesn't remember more about spell. Inpatient Medications:    . aspirin EC  81 mg Oral Daily  . collagenase   Topical Daily  . doxycycline (VIBRAMYCIN) IV  100 mg Intravenous Q12H  . folic acid  1 mg Oral Daily  . heparin  5,000 Units Subcutaneous Q8H  . lisinopril  10 mg Oral Daily   And  . hydrochlorothiazide  12.5 mg Oral Daily  . insulin aspart  0-9 Units Subcutaneous TID WC  . levetiracetam  500 mg Intravenous Q12H  . magnesium oxide  400 mg Oral Daily  . methotrexate  7.5 mg Oral Weekly  . niacin  1,000 mg Oral QHS  . nicotine  21 mg Transdermal Daily  . oxybutynin  5 mg Oral TID  . potassium chloride SA  20 mEq Oral Daily  . predniSONE  5 mg Oral QAC breakfast  . sodium chloride  3 mL Intravenous Q12H  . thiamine  100 mg Oral Daily  . torsemide  10 mg Oral Daily    Family History  Problem Relation Age of Onset  . Alcohol abuse Mother   . Depression Mother   . Heart disease  Mother   . Diabetes Mother   . Stroke Mother   . Diabetes Other     1/2 brother     History   Social History  . Marital Status: Divorced    Spouse Name: N/A    Number of Children: N/A  . Years of Education: N/A   Occupational History  . disabled    Social History Main Topics  . Smoking status: Current Every Day Smoker -- 0.5 packs/day  . Smokeless tobacco: Not on file  . Alcohol Use: 0.6 oz/week    1 Glasses of wine per week     Comment: daily  . Drug Use: No  . Sexually Active: No   Other Topics Concern  . Not on file   Social History Narrative  . No narrative on file     Review of Systems: All other systems reviewed and are otherwise negative except as noted above.  Physical Exam: Filed Vitals:   02/04/12 1600  BP: 141/70  Pulse: 68  Temp: 97.7 F (36.5 C)  Resp: 18    Intake/Output Summary (Last 24 hours) at 02/04/12 1629 Last data filed at 02/04/12 1100  Gross per 24 hour  Intake   1250 ml  Output   3075 ml  Net  -1825 ml    General: Well developed, well nourished, in no acute distress. Head: Normocephalic,  Bruise to face and neck Neck: Negative for carotid bruits. JVP not elevated. Lungs: Clear bilaterally to auscultation without wheezes, rales, or rhonchi. Breathing is unlabored. Heart: RRR with S1 S2. No murmurs, rubs, or gallops appreciated. Abdomen: Soft, non-tender, non-distended with normoactive bowel sounds. No hepatomegaly. No rebound/guarding. No obvious abdominal masses. Msk:  Strength and tone appears normal for age. Extremities: No clubbing, cyanosis or edema.  Distal pedal pulses are 2+ and equal bilaterally. Neuro:  Patient answers questions.  Sleepy.  Labs: Results for orders placed during the hospital encounter of 02/03/12 (from the past 24 hour(s))  AMMONIA     Status: Normal   Collection Time   02/03/12  6:19 PM      Component Value  Range   Ammonia 46  11 - 60 umol/L  CBC     Status: Abnormal   Collection Time    02/03/12  6:19 PM      Component Value Range   WBC 9.5  4.0 - 10.5 K/uL   RBC 4.41  4.22 - 5.81 MIL/uL   Hemoglobin 14.6  13.0 - 17.0 g/dL   HCT 16.1  09.6 - 04.5 %   MCV 101.6 (*) 78.0 - 100.0 fL   MCH 33.1  26.0 - 34.0 pg   MCHC 32.6  30.0 - 36.0 g/dL   RDW 40.9  81.1 - 91.4 %   Platelets 255  150 - 400 K/uL  CREATININE, SERUM     Status: Normal   Collection Time   02/03/12  6:19 PM      Component Value Range   Creatinine, Ser 0.57  0.50 - 1.35 mg/dL   GFR calc non Af Amer >90  >90 mL/min   GFR calc Af Amer >90  >90 mL/min  TROPONIN I     Status: Normal   Collection Time   02/03/12  6:19 PM      Component Value Range   Troponin I <0.30  <0.30 ng/mL  SALICYLATE LEVEL     Status: Abnormal   Collection Time   02/03/12  6:19 PM      Component Value Range   Salicylate Lvl <2.0 (*) 2.8 - 20.0 mg/dL  ACETAMINOPHEN LEVEL     Status: Normal   Collection Time   02/03/12  6:19 PM      Component Value Range   Acetaminophen (Tylenol), Serum <15.0  10 - 30 ug/mL  HEMOGLOBIN A1C     Status: Normal   Collection Time   02/03/12  6:19 PM      Component Value Range   Hemoglobin A1C 5.5  <5.7 %   Mean Plasma Glucose 111  <117 mg/dL  GLUCOSE, CAPILLARY     Status: Abnormal   Collection Time   02/04/12 12:20 AM      Component Value Range   Glucose-Capillary 120 (*) 70 - 99 mg/dL  TROPONIN I     Status: Normal   Collection Time   02/04/12 12:22 AM      Component Value Range   Troponin I <0.30  <0.30 ng/mL  TSH     Status: Normal   Collection Time   02/04/12 12:43 AM      Component Value Range   TSH 0.398  0.350 - 4.500 uIU/mL  VITAMIN B12     Status: Normal   Collection Time   02/04/12 12:43 AM      Component Value Range   Vitamin B-12 326  211 - 911 pg/mL  LACTIC ACID, PLASMA     Status: Normal   Collection Time   02/04/12 12:44 AM      Component Value Range   Lactic Acid, Venous 1.5  0.5 - 2.2 mmol/L  MRSA PCR SCREENING     Status: Normal   Collection Time   02/04/12   1:28 AM      Component Value Range   MRSA by PCR NEGATIVE  NEGATIVE  POCT I-STAT 3, BLOOD GAS (G3+)     Status: Abnormal   Collection Time   02/04/12  1:28 AM      Component Value Range   pH, Arterial 7.477 (*) 7.350 - 7.450   pCO2 arterial 40.4  35.0 - 45.0 mmHg   pO2, Arterial 34.0 (*)  80.0 - 100.0 mmHg   Bicarbonate 29.9 (*) 20.0 - 24.0 mEq/L   TCO2 31  0 - 100 mmol/L   O2 Saturation 70.0     Acid-Base Excess 6.0 (*) 0.0 - 2.0 mmol/L   Sample type ARTERIAL     Comment MD NOTIFIED, REPEAT TEST    POCT I-STAT 3, BLOOD GAS (G3+)     Status: Abnormal   Collection Time   02/04/12  1:42 AM      Component Value Range   pH, Arterial 7.473 (*) 7.350 - 7.450   pCO2 arterial 40.0  35.0 - 45.0 mmHg   pO2, Arterial 70.0 (*) 80.0 - 100.0 mmHg   Bicarbonate 29.3 (*) 20.0 - 24.0 mEq/L   TCO2 31  0 - 100 mmol/L   O2 Saturation 95.0     Acid-Base Excess 5.0 (*) 0.0 - 2.0 mmol/L   Sample type ARTERIAL    GLUCOSE, CAPILLARY     Status: Abnormal   Collection Time   02/04/12  4:32 AM      Component Value Range   Glucose-Capillary 113 (*) 70 - 99 mg/dL   Comment 1 Documented in Chart    BASIC METABOLIC PANEL     Status: Abnormal   Collection Time   02/04/12  6:00 AM      Component Value Range   Sodium 139  135 - 145 mEq/L   Potassium 2.7 (*) 3.5 - 5.1 mEq/L   Chloride 101  96 - 112 mEq/L   CO2 28  19 - 32 mEq/L   Glucose, Bld 105 (*) 70 - 99 mg/dL   BUN 4 (*) 6 - 23 mg/dL   Creatinine, Ser 9.60  0.50 - 1.35 mg/dL   Calcium 8.7  8.4 - 45.4 mg/dL   GFR calc non Af Amer >90  >90 mL/min   GFR calc Af Amer >90  >90 mL/min  CBC     Status: Abnormal   Collection Time   02/04/12  6:00 AM      Component Value Range   WBC 8.8  4.0 - 10.5 K/uL   RBC 4.30  4.22 - 5.81 MIL/uL   Hemoglobin 15.0  13.0 - 17.0 g/dL   HCT 09.8  11.9 - 14.7 %   MCV 103.7 (*) 78.0 - 100.0 fL   MCH 34.9 (*) 26.0 - 34.0 pg   MCHC 33.6  30.0 - 36.0 g/dL   RDW 82.9  56.2 - 13.0 %   Platelets 240  150 - 400 K/uL   TROPONIN I     Status: Normal   Collection Time   02/04/12  6:06 AM      Component Value Range   Troponin I <0.30  <0.30 ng/mL  GLUCOSE, CAPILLARY     Status: Abnormal   Collection Time   02/04/12  8:04 AM      Component Value Range   Glucose-Capillary 103 (*) 70 - 99 mg/dL  GLUCOSE, CAPILLARY     Status: Abnormal   Collection Time   02/04/12 12:27 PM      Component Value Range   Glucose-Capillary 119 (*) 70 - 99 mg/dL   Comment 1 Notify RN      Radiology/Studies: Dg Lumbar Spine Complete  02/03/2012  *RADIOLOGY REPORT*  Clinical Data: Fall.  Low back pain.  LUMBAR SPINE - COMPLETE 4+ VIEW  Comparison: 11/12/2011, MRI 02/18/2011  Findings: Grade 1 slip L5 on S1 is stable from prior studies. There are bilateral pars defects of L5.  Remaining lumbar alignment is normal.  No acute fracture.  Mild disc degeneration and anterior spurring L2-3 and L3-4.  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  No acute fracture.   Original Report Authenticated By: Janeece Riggers, M.D.    Ct Head Wo Contrast  02/04/2012  *RADIOLOGY REPORT*  Clinical Data: Altered mental status, possible seizure  CT HEAD WITHOUT CONTRAST  Technique:  Contiguous axial images were obtained from the base of the skull through the vertex without contrast.  Comparison: 02/03/2012  Findings: Motion degraded images.  No evidence of parenchymal hemorrhage or extra-axial fluid collection. No mass lesion, mass effect, or midline shift.  No CT evidence of acute infarction.  Subcortical white matter and periventricular small vessel ischemic changes.  Intracranial atherosclerosis.  Cerebral volume is age appropriate.  No ventriculomegaly.  The visualized paranasal sinuses are essentially clear. The mastoid air cells are unopacified.  No evidence of calvarial fracture.  IMPRESSION: Motion degraded images.  No interval change from recent CT.   Original Report Authenticated By: Charline Bills, M.D.    Ct Head Wo  Contrast  02/03/2012  *RADIOLOGY REPORT*  Clinical Data:  Found down.  Weakness and confusion.  CT HEAD WITHOUT CONTRAST CT CERVICAL SPINE WITHOUT CONTRAST  Technique:  Multidetector CT imaging of the head and cervical spine was performed following the standard protocol without intravenous contrast.  Multiplanar CT image reconstructions of the cervical spine were also generated.  Comparison:  11/22/2011.  CT HEAD  Findings: The ventricles are in the midline without mass effect or shift.  There are stable in size and configuration.  There is advanced periventricular white matter disease for age.  No extra- axial fluid collections identified.  No CT findings for acute hemispheric infarction and/or intracranial hemorrhage.  No mass lesions.  The brainstem and cerebellum grossly normal and stable.  The bony structures are intact.  The paranasal sinuses and mastoid air cells are clear.  IMPRESSION:  1.  Stable age advanced periventricular white matter disease. 2.  No acute intracranial findings or mass lesions.  CT CERVICAL SPINE  Findings: Examination is degraded by motion artifact but the overall alignment of the cervical vertebral bodies is normal and no acute fractures identified.  There is a remote ununited spinous process fracture of T1.  IMPRESSION: Normal alignment and no acute bony findings.   Original Report Authenticated By: Rudie Meyer, M.D.    Ct Cervical Spine Wo Contrast  02/03/2012  *RADIOLOGY REPORT*  Clinical Data:  Found down.  Weakness and confusion.  CT HEAD WITHOUT CONTRAST CT CERVICAL SPINE WITHOUT CONTRAST  Technique:  Multidetector CT imaging of the head and cervical spine was performed following the standard protocol without intravenous contrast.  Multiplanar CT image reconstructions of the cervical spine were also generated.  Comparison:  11/22/2011.  CT HEAD  Findings: The ventricles are in the midline without mass effect or shift.  There are stable in size and configuration.  There is  advanced periventricular white matter disease for age.  No extra- axial fluid collections identified.  No CT findings for acute hemispheric infarction and/or intracranial hemorrhage.  No mass lesions.  The brainstem and cerebellum grossly normal and stable.  The bony structures are intact.  The paranasal sinuses and mastoid air cells are clear.  IMPRESSION:  1.  Stable age advanced periventricular white matter disease. 2.  No acute intracranial findings or mass lesions.  CT CERVICAL SPINE  Findings: Examination is degraded by motion artifact but the  overall alignment of the cervical vertebral bodies is normal and no acute fractures identified.  There is a remote ununited spinous process fracture of T1.  IMPRESSION: Normal alignment and no acute bony findings.   Original Report Authenticated By: Rudie Meyer, M.D.    Dg Pelvis Portable  02/03/2012  *RADIOLOGY REPORT*  Clinical Data: Larey Seat.  PORTABLE PELVIS  Comparison: None  Findings: The hips are normally located.  No acute hip fracture. The pubic symphysis and SI joints are intact.  No pelvic fractures.  IMPRESSION: No acute bony findings.   Original Report Authenticated By: Rudie Meyer, M.D.    Dg Chest Port 1 View  02/03/2012  *RADIOLOGY REPORT*  Clinical Data: Found down.  Confused.  PORTABLE CHEST - 1 VIEW  Comparison: 11/12/2011.  Findings: The cardiac silhouette, mediastinal and hilar contours are stable given the supine position of the patient.  The lungs are grossly clear.  The bony thorax is intact.  Remote healed rib fractures are noted.  IMPRESSION: No acute cardiopulmonary findings and intact bony thorax.   Original Report Authenticated By: Rudie Meyer, M.D.     EKG (12/20):  Sinus bradycardia  58 bpm  Nonspecific ST changes  EKG  (12/19):  Sinus bradycardia  Sl ST depression laterally (upsloping) witn PVCs  ASSESSMENT AND PLAN:   Patient a 61 yo with history of reported CAD though no records.  Admitted after fall ?syncope vs seizure.   Had palpitations prior to event. Here in hosp has had sinus bradycardia as well as rapid afib.  Teated with IV diltiazem.  Now off. HR in 40s to 50s  SBP is adequate in 130s to 150s  There has been 1 short burst of rapid afib for about 1 min earlier today (130s)  Syncope vs seizure?   Could have been due to rapid afib with post termination pause.  Could have been seize.  With in/out of rapid afib would recomm amiodarone.  Not good candidate for other antiarrhythmics  WIll most likely proceed to pacer but would follow heart rates for now. Not a coumadin candidate. Give IV for now since just went into afib with RVR.  Transition to PO Will need to watch LFTs closely with other meds.  Recomm echo to evaluate LV.    2.  Hx CAD   Reported  Need to track down records from remote heart events.  QUestion if ever cathed.  R/O for MI  Get echo  3.  HTN  On meds  4.  Hypokalemia.  Replete.      Scherrie Merritts 02/04/2012, 4:29 PM

## 2012-02-04 NOTE — Progress Notes (Signed)
PGY-1 Daily Progress Note Family Medicine Teaching Service [place] R. Franciscojavier Wronski, DO Service Pager: [phone]   Subjective: Pt asleep this AM.    Objective:  VITALS Temp:  [97.6 F (36.4 C)-98.9 F (37.2 C)] 98 F (36.7 C) (12/20 0805) Pulse Rate:  [56-92] 63  (12/20 0500) Resp:  [15-18] 17  (12/20 0500) BP: (121-189)/(72-94) 146/87 mmHg (12/20 0500) SpO2:  [90 %-100 %] 98 % (12/20 0500) Weight:  [232 lb 9.4 oz (105.5 kg)] 232 lb 9.4 oz (105.5 kg) (12/19 2320)  In/Out  Intake/Output Summary (Last 24 hours) at 02/04/12 0913 Last data filed at 02/04/12 0600  Gross per 24 hour  Intake    800 ml  Output   3150 ml  Net  -2350 ml    Physical Exam: General: Disheveled HEENT: MMM, poor dentia,  Cardiovascular: RRR, S1/S2 present, no murmurs appreciated  Respiratory: CTA B/L, no wheezes, no respiratory distress  Abdomen: Soft, NT/ND, NABS  Extremities: + Ulnar deviation of fingers Skin:  +1 pitting Edema B/L , LE venous dermatitis, + 2 x 2 cm non-purulent, non-painful healing wound R proximal tibia.  Neuro: , no focal neurologic deficits (pt asleep) Vascular: +1 pedal pulses B/L     MEDS Scheduled Meds:    . aspirin EC  81 mg Oral Daily  . collagenase   Topical Daily  . doxycycline (VIBRAMYCIN) IV  100 mg Intravenous Q12H  . folic acid  1 mg Oral Daily  . haloperidol lactate      . haloperidol lactate  5 mg Intravenous Once  . heparin  5,000 Units Subcutaneous Q8H  . lisinopril  10 mg Oral Daily   And  . hydrochlorothiazide  12.5 mg Oral Daily  . insulin aspart  0-9 Units Subcutaneous TID WC  . levetiracetam  500 mg Intravenous Q12H  . magnesium oxide  400 mg Oral Daily  . methotrexate  7.5 mg Oral Weekly  . niacin  1,000 mg Oral QHS  . nicotine  21 mg Transdermal Daily  . oxybutynin  5 mg Oral TID  . potassium chloride SA  20 mEq Oral Daily  . predniSONE  5 mg Oral QAC breakfast  . sodium chloride  3 mL Intravenous Q12H  . thiamine  100 mg Oral Daily  . torsemide   10 mg Oral Daily   Continuous Infusions:  PRN Meds:.acetaminophen, acetaminophen, albuterol-ipratropium, diphenoxylate-atropine, nitroGLYCERIN, ondansetron (ZOFRAN) IV, ondansetron  Labs and imaging:   CBC  Lab 02/04/12 0600 02/03/12 1819 02/03/12 1143  WBC 8.8 9.5 13.5*  HGB 15.0 14.6 15.3  HCT 44.6 44.8 46.7  PLT 240 255 277   BMET/CMET  Lab 02/04/12 0600 02/03/12 1819 02/03/12 1143  NA 139 -- 140  K 2.7* -- 3.1*  CL 101 -- 99  CO2 28 -- 22  BUN 4* -- 8  CREATININE 0.78 0.57 0.81  CALCIUM 8.7 -- 9.0  PROT -- -- 6.7  BILITOT -- -- 0.5  ALKPHOS -- -- 75  ALT -- -- 16  AST -- -- 18  GLUCOSE 105* -- 111*   Results for orders placed during the hospital encounter of 02/03/12 (from the past 24 hour(s))  URINE RAPID DRUG SCREEN (HOSP PERFORMED)     Status: Normal   Collection Time   02/03/12  1:01 PM      Component Value Range   Opiates NONE DETECTED  NONE DETECTED   Cocaine NONE DETECTED  NONE DETECTED   Benzodiazepines NONE DETECTED  NONE DETECTED   Amphetamines NONE DETECTED  NONE DETECTED   Tetrahydrocannabinol NONE DETECTED  NONE DETECTED   Barbiturates NONE DETECTED  NONE DETECTED  URINALYSIS, ROUTINE W REFLEX MICROSCOPIC     Status: Abnormal   Collection Time   02/03/12  1:01 PM      Component Value Range   Color, Urine YELLOW  YELLOW   APPearance CLEAR  CLEAR   Specific Gravity, Urine 1.018  1.005 - 1.030   pH 7.0  5.0 - 8.0   Glucose, UA NEGATIVE  NEGATIVE mg/dL   Hgb urine dipstick TRACE (*) NEGATIVE   Bilirubin Urine NEGATIVE  NEGATIVE   Ketones, ur NEGATIVE  NEGATIVE mg/dL   Protein, ur >811 (*) NEGATIVE mg/dL   Urobilinogen, UA 1.0  0.0 - 1.0 mg/dL   Nitrite NEGATIVE  NEGATIVE   Leukocytes, UA NEGATIVE  NEGATIVE  URINE MICROSCOPIC-ADD ON     Status: Abnormal   Collection Time   02/03/12  1:01 PM      Component Value Range   Squamous Epithelial / LPF RARE  RARE   WBC, UA 0-2  <3 WBC/hpf   RBC / HPF 0-2  <3 RBC/hpf   Bacteria, UA FEW (*) RARE    Casts HYALINE CASTS (*) NEGATIVE   Urine-Other SPERM PRESENT    AMMONIA     Status: Normal   Collection Time   02/03/12  6:19 PM      Component Value Range   Ammonia 46  11 - 60 umol/L  CBC     Status: Abnormal   Collection Time   02/03/12  6:19 PM      Component Value Range   WBC 9.5  4.0 - 10.5 K/uL   RBC 4.41  4.22 - 5.81 MIL/uL   Hemoglobin 14.6  13.0 - 17.0 g/dL   HCT 91.4  78.2 - 95.6 %   MCV 101.6 (*) 78.0 - 100.0 fL   MCH 33.1  26.0 - 34.0 pg   MCHC 32.6  30.0 - 36.0 g/dL   RDW 21.3  08.6 - 57.8 %   Platelets 255  150 - 400 K/uL  CREATININE, SERUM     Status: Normal   Collection Time   02/03/12  6:19 PM      Component Value Range   Creatinine, Ser 0.57  0.50 - 1.35 mg/dL   GFR calc non Af Amer >90  >90 mL/min   GFR calc Af Amer >90  >90 mL/min  TROPONIN I     Status: Normal   Collection Time   02/03/12  6:19 PM      Component Value Range   Troponin I <0.30  <0.30 ng/mL  SALICYLATE LEVEL     Status: Abnormal   Collection Time   02/03/12  6:19 PM      Component Value Range   Salicylate Lvl <2.0 (*) 2.8 - 20.0 mg/dL  ACETAMINOPHEN LEVEL     Status: Normal   Collection Time   02/03/12  6:19 PM      Component Value Range   Acetaminophen (Tylenol), Serum <15.0  10 - 30 ug/mL  HEMOGLOBIN A1C     Status: Normal   Collection Time   02/03/12  6:19 PM      Component Value Range   Hemoglobin A1C 5.5  <5.7 %   Mean Plasma Glucose 111  <117 mg/dL  GLUCOSE, CAPILLARY     Status: Abnormal   Collection Time   02/04/12 12:20 AM      Component Value Range   Glucose-Capillary  120 (*) 70 - 99 mg/dL  TROPONIN I     Status: Normal   Collection Time   02/04/12 12:22 AM      Component Value Range   Troponin I <0.30  <0.30 ng/mL  TSH     Status: Normal   Collection Time   02/04/12 12:43 AM      Component Value Range   TSH 0.398  0.350 - 4.500 uIU/mL  VITAMIN B12     Status: Normal   Collection Time   02/04/12 12:43 AM      Component Value Range   Vitamin B-12 326  211  - 911 pg/mL  LACTIC ACID, PLASMA     Status: Normal   Collection Time   02/04/12 12:44 AM      Component Value Range   Lactic Acid, Venous 1.5  0.5 - 2.2 mmol/L  MRSA PCR SCREENING     Status: Normal   Collection Time   02/04/12  1:28 AM      Component Value Range   MRSA by PCR NEGATIVE  NEGATIVE  POCT I-STAT 3, BLOOD GAS (G3+)     Status: Abnormal   Collection Time   02/04/12  1:28 AM      Component Value Range   pH, Arterial 7.477 (*) 7.350 - 7.450   pCO2 arterial 40.4  35.0 - 45.0 mmHg   pO2, Arterial 34.0 (*) 80.0 - 100.0 mmHg   Bicarbonate 29.9 (*) 20.0 - 24.0 mEq/L   TCO2 31  0 - 100 mmol/L   O2 Saturation 70.0     Acid-Base Excess 6.0 (*) 0.0 - 2.0 mmol/L   Sample type ARTERIAL     Comment MD NOTIFIED, REPEAT TEST    POCT I-STAT 3, BLOOD GAS (G3+)     Status: Abnormal   Collection Time   02/04/12  1:42 AM      Component Value Range   pH, Arterial 7.473 (*) 7.350 - 7.450   pCO2 arterial 40.0  35.0 - 45.0 mmHg   pO2, Arterial 70.0 (*) 80.0 - 100.0 mmHg   Bicarbonate 29.3 (*) 20.0 - 24.0 mEq/L   TCO2 31  0 - 100 mmol/L   O2 Saturation 95.0     Acid-Base Excess 5.0 (*) 0.0 - 2.0 mmol/L   Sample type ARTERIAL    GLUCOSE, CAPILLARY     Status: Abnormal   Collection Time   02/04/12  4:32 AM      Component Value Range   Glucose-Capillary 113 (*) 70 - 99 mg/dL   Comment 1 Documented in Chart    BASIC METABOLIC PANEL     Status: Abnormal   Collection Time   02/04/12  6:00 AM      Component Value Range   Sodium 139  135 - 145 mEq/L   Potassium 2.7 (*) 3.5 - 5.1 mEq/L   Chloride 101  96 - 112 mEq/L   CO2 28  19 - 32 mEq/L   Glucose, Bld 105 (*) 70 - 99 mg/dL   BUN 4 (*) 6 - 23 mg/dL   Creatinine, Ser 1.61  0.50 - 1.35 mg/dL   Calcium 8.7  8.4 - 09.6 mg/dL   GFR calc non Af Amer >90  >90 mL/min   GFR calc Af Amer >90  >90 mL/min  CBC     Status: Abnormal   Collection Time   02/04/12  6:00 AM      Component Value Range   WBC 8.8  4.0 - 10.5  K/uL   RBC 4.30  4.22  - 5.81 MIL/uL   Hemoglobin 15.0  13.0 - 17.0 g/dL   HCT 16.1  09.6 - 04.5 %   MCV 103.7 (*) 78.0 - 100.0 fL   MCH 34.9 (*) 26.0 - 34.0 pg   MCHC 33.6  30.0 - 36.0 g/dL   RDW 40.9  81.1 - 91.4 %   Platelets 240  150 - 400 K/uL  TROPONIN I     Status: Normal   Collection Time   02/04/12  6:06 AM      Component Value Range   Troponin I <0.30  <0.30 ng/mL  GLUCOSE, CAPILLARY     Status: Abnormal   Collection Time   02/04/12  8:04 AM      Component Value Range   Glucose-Capillary 103 (*) 70 - 99 mg/dL   Dg Lumbar Spine Complete  02/03/2012  *RADIOLOGY REPORT*  Clinical Data: Fall.  Low back pain.  LUMBAR SPINE - COMPLETE 4+ VIEW  Comparison: 11/12/2011, MRI 02/18/2011  Findings: Grade 1 slip L5 on S1 is stable from prior studies. There are bilateral pars defects of L5.  Remaining lumbar alignment is normal.  No acute fracture.  Mild disc degeneration and anterior spurring L2-3 and L3-4.  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  No acute fracture.   Original Report Authenticated By: Janeece Riggers, M.D.    Ct Head Wo Contrast  02/04/2012   IMPRESSION: Motion degraded images.  No interval change from recent CT.   Original Report Authenticated By: Charline Bills, M.D.    Ct Head Wo Contrast  02/03/2012   IMPRESSION: Normal alignment and no acute bony findings.   Original Report Authenticated By: Rudie Meyer, M.D.    Ct Cervical Spine Wo Contrast  02/03/2012  .  IMPRESSION: Normal alignment and no acute bony findings.   Original Report Authenticated By: Rudie Meyer, M.D.    Dg Pelvis Portable  02/03/2012    IMPRESSION: No acute bony findings.   Original Report Authenticated By: Rudie Meyer, M.D.    Dg Chest Port 1 View  02/03/2012  .  IMPRESSION: No acute cardiopulmonary findings and intact bony thorax.   Original Report Authenticated By: Rudie Meyer, M.D.     Assessment   Patrick Holt is a 61 y.o. year old male with h/o RA, Depression, HTN,  diverticulosis/itis, HLD, COPD, Reflux, Seizures, Fibromyalgia, Dermatomyositis, MIO, raynaud's, hiatal hernia, neprholithiasis, esophageal dysmotility, paroxysmal A-fib, and otosclerosis presenting w/ fall and likely seizure.   1. Fall, with witnessed tonic clonic seizure- Pt presented with fall. CT scan negative in ED. Multiple labs done on admission and loaded with dilantin and one time dose of Ativan. Unsure of etiology. Pt w/ known seizure disorder but w/o seizure for >60yr. No evidence of infection or metabolic derangement. Last ativan and percocet ~2wks ago and drinks 1 glass of wine nightly. Cardiac source of fall possible as q/ h/o paroxysmal A-fib and w/ h/o incontinence, but unlikely.  1. Transferred pt to SDU last night due to telemetry monitor showing tachy/brady episodes. 2. Neuro consulted last PM due to possible ongoing seizure, greatly appreciate recs.  Repeat CT last PM did not show acute changes.  Will get TSH, B12 today.  Will continue thiamine supplementation and Keppra 500 BID.  Also will want to get MRI to r/o acute pathology that would not show up on CT 3. Pt seen by speech this AM.  Passed swallow study, now on dysphagia  3 diet.  4. Seizure precautions, aspiration precautions, and bedrest. If becomes combative, will need restraints and possible Haldol  5. For possible alcohol withdrawal, CIWA protocol. Oral B1 and Folate.  6. Labs from 12/20 AM show Ammonia at 46, neg Salicylate level, neg acetaminophen level  7. PT/OT evaluation when clinically improved  2. Pt with Hx of Psych disorder - Pt w/ no formal diagnoses but w/ apparent Psych history. Pt seen by Saul Fordyce, NP [facility] [place]). Called practice to obtain information about diagnosis and treatment and was informed that pt had been fired from [employer].   1. Pt w/ significant tangential thinking and likely confabulation.  May be combination of alcohol abuse/psychiatry disorder   2. Monitor for  acute psychosis . W/ Haldol prn (QTc 0.448, would give up to QTc 0.50) 3. F/u w/ psych office today  4. Inpt psych to evaluate today, greatly appreciate  3. DM II , controlled.  A1C from 12/19 at 5.5  1. SSI sensitive. No basal insulin 2. CBG with meals and QHS, if elevated, increase SSI  4. Hypokalemia - Pt found to have K+ of 2.7 on 12/20 Am 1. Will replenish this AM 40 meq KDur PO 2. Recheck BMP at 1700 today.  Consider another dose if low. 5. HTN - BP on admission 135/84  1. Will continue home meds including Metoprolol and Zestoretic 6. Dermatomyositis - Stable, no clinical evidence  1. Continue home Prednisone 5 mg qd  7. Rheumatoid Arthritis - Stable  1. Continue home MTX  8. Ischemic Cardiomyopathy, hx of MI x 2- Unsure of baseline. EKG w/o evidenc of MI at this time. Complained of palpitations and had evidence of bradycardia and tachycardia on telemetry last night. See's Dr. Daleen Squibb at [facility].  Will consult them today for formal evaluation  1. Continue ASA and BB 2. Troponins negative x 3 and repeat EKG negative for acute changes.  3. Strict I/O and daily weights  9. Skin wound with possible cellulitis - Pt initially had non purulent, non draining, non erythematous  1. Wound care  2. BCx x 2 pending. Started on IV doxycycline. F/U CBC   3. LE Doppler negative for acute DVT 10. Tobacco Abuse - Smokes about 1/2 ppd for unstated length  1. Start Nicotine patches for replacement (21) 11. Bowel and Bladder Incontinence with hx of IBS- States it is chronic  1. Will continue home meds including Oxybutynin, Lomotil, Mag Ox, Bentyl  12. Hx of COPD - No evidence of exacerbation.  1. Continue Combivent 2. O2 PRN to keep O2 > 92%  13. FEN/GI: Dysphagia Three Diet 1. Zofran PRN for nausea.  14. Prophylaxis: Heparin SQ 15. Disposition: Telemetry pending further w/u of AMS 16. Code Status: Unable to obtain. Will need to discuss if has capacity.   Twana First Paulina Fusi, DO of [facility]  [facility] 02/04/2012, 9:10 AM

## 2012-02-04 NOTE — Progress Notes (Signed)
PT Cancellation Note  Patient Details Name: Patrick Holt MRN: [id number] DOB: 1950/11/11   Cancelled Treatment:    Reason Eval/Treat Not Completed: Medical issues which prohibited therapy (pt with K 2.7 and await repletion prior to eval)   Delaney Meigs, PT [phone]

## 2012-02-04 NOTE — Progress Notes (Signed)
Patients HR increased to 154 for about a minute, dropped to 44 for a few seconds and then resumed to sb/sr 55-65, pt was asleep during episode and asymptomatic. Placed strips in chart and will cont to monitor.

## 2012-02-04 NOTE — Care Management Note (Addendum)
    Page 1 of 2   02/08/2012     11:32:38 AM   CARE MANAGEMENT NOTE 02/08/2012  Patient:  Patrick Holt, Patrick Holt   Account Number:  [id number]  Date Initiated:  02/04/2012  Documentation initiated by:  Junius Creamer  Subjective/Objective Assessment:   adm w fall, seizure     Action/Plan:   lives alone, uses w/c, hx of [facility]   Anticipated DC Date:  02/08/2012   Anticipated DC Plan:  HOME W HOME HEALTH SERVICES  In-house referral  Clinical Social Worker      DC Associate Professor  CM consult      [facility] Choice  HOME HEALTH   Choice offered to / List presented to:  C-1 Patient        HH arranged  HH-1 Theatre stage manager PT      [facility] agency  [facility]   Status of service:   Medicare Important Message given?   (If response is "NO", the following Medicare IM given date fields will be blank) Date Medicare IM given:   Date Additional Medicare IM given:    Discharge Disposition:  HOME W HOME HEALTH SERVICES  Per UR Regulation:  Reviewed for med. necessity/level of care/duration of stay  If discussed at Long Length of Stay Meetings, dates discussed:    Comments:  12/24 1129a debbie Neeka Urista rn,bsn spoke w pt and gave pt list of hhc agencies. no pref. called several agencies and phy ther is about a week out and [facility] will send nse to ck on pt until phy ther starts. pt aware and will alert md also. pt aware that hhc is limited on visits and he states cannot pay for addit help but also does not want snf.  12/23 1237 debbie Montoya Brandel rn,bsn phy ther rec snf, sw ref made.  12/20 1425p debbie Sanaiyah Kirchhoff rn,bsn pt for pt/ot eval. will assist w dc planning as pt progresses.

## 2012-02-04 NOTE — Progress Notes (Signed)
Patient resting comfortably now. VS wnl. Will cont to monitor.

## 2012-02-04 NOTE — Progress Notes (Signed)
Subjective: Patient awake and alert this morning.  Very talkative.  Wants to eat and wants to leave.    Objective: Current vital signs: BP 146/87  Pulse 63  Temp 98 F (36.7 C) (Oral)  Resp 17  Ht 5\' 9"  (1.753 m)  Wt 105.5 kg (232 lb 9.4 oz)  BMI 34.35 kg/m2  SpO2 98% Vital signs in last 24 hours: Temp:  [97.6 F (36.4 C)-98.9 F (37.2 C)] 98 F (36.7 C) (12/20 0805) Pulse Rate:  [56-92] 63  (12/20 0500) Resp:  [15-18] 17  (12/20 0500) BP: (121-189)/(72-94) 146/87 mmHg (12/20 0500) SpO2:  [90 %-100 %] 98 % (12/20 0500) Weight:  [105.5 kg (232 lb 9.4 oz)] 105.5 kg (232 lb 9.4 oz) (12/19 2320)  Intake/Output from previous day: 12/19 0701 - 12/20 0700 In: 800 [I.V.:450; IV Piggyback:350] Out: 3150 [Urine:3150] Intake/Output this shift:   Nutritional status: Dysphagia  Neurologic Exam: Mental Status: Alert.  Able to give me detail about his history and recent events. Speech fluent without evidence of aphasia.  Able to follow 3 step commands without difficulty. Cranial Nerves: II: Discs flat bilaterally; Visual fields grossly normal, pupils equal, round, reactive to light and accommodation III,IV, VI: right ptosis likely secondary to trauma, extra-ocular motions intact bilaterally V,VII: smile symmetric, facial light touch sensation normal bilaterally VIII: hearing normal bilaterally IX,X: gag reflex present XI: bilateral shoulder shrug XII: midline tongue extension Motor: Right : Upper extremity   5/5    Left:     Upper extremity   5/5  Lower extremity   5/5     Lower extremity   5/5 Sensory: Pinprick and light touch intact throughout, bilaterally Deep Tendon Reflexes: 2+ in the upper extremities, trace at the knees and absent at the ankles Plantars: Right: downgoing   Left: downgoing   Lab Results: Basic Metabolic Panel:  Lab 02/04/12 4540 02/03/12 1819 02/03/12 1143  NA 139 -- 140  K 2.7* -- 3.1*  CL 101 -- 99  CO2 28 -- 22  GLUCOSE 105* -- 111*  BUN 4* --  8  CREATININE 0.78 0.57 0.81  CALCIUM 8.7 -- 9.0  MG -- -- --  PHOS -- -- --    Liver Function Tests:  Lab 02/03/12 1143  AST 18  ALT 16  ALKPHOS 75  BILITOT 0.5  PROT 6.7  ALBUMIN 3.4*   No results found for this basename: LIPASE:5,AMYLASE:5 in the last 168 hours  Lab 02/03/12 1819  AMMONIA 46    CBC:  Lab 02/04/12 0600 02/03/12 1819 02/03/12 1143  WBC 8.8 9.5 13.5*  NEUTROABS -- -- 8.0*  HGB 15.0 14.6 15.3  HCT 44.6 44.8 46.7  MCV 103.7* 101.6* 106.4*  PLT 240 255 277    Cardiac Enzymes:  Lab 02/04/12 0606 02/04/12 0022 02/03/12 1819 02/03/12 1143  CKTOTAL -- -- -- --  CKMB -- -- -- --  CKMBINDEX -- -- -- --  TROPONINI <0.30 <0.30 <0.30 <0.30    Lipid Panel: No results found for this basename: CHOL:5,TRIG:5,HDL:5,CHOLHDL:5,VLDL:5,LDLCALC:5 in the last 168 hours  CBG:  Lab 02/04/12 0804 02/04/12 0432 02/04/12 0020 02/03/12 1131  GLUCAP 103* 113* 120* 133*    Microbiology: Results for orders placed during the hospital encounter of 02/03/12  MRSA PCR SCREENING     Status: Normal   Collection Time   02/04/12  1:28 AM      Component Value Range Status Comment   MRSA by PCR NEGATIVE  NEGATIVE Final     Coagulation Studies:  Basename 02/03/12 1143  LABPROT 13.4  INR 1.03    Imaging: Dg Lumbar Spine Complete  02/03/2012  *RADIOLOGY REPORT*  Clinical Data: Fall.  Low back pain.  LUMBAR SPINE - COMPLETE 4+ VIEW  Comparison: 11/12/2011, MRI 02/18/2011  Findings: Grade 1 slip L5 on S1 is stable from prior studies. There are bilateral pars defects of L5.  Remaining lumbar alignment is normal.  No acute fracture.  Mild disc degeneration and anterior spurring L2-3 and L3-4.  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  No acute fracture.   Original Report Authenticated By: Janeece Riggers, M.D.    Ct Head Wo Contrast  02/04/2012  *RADIOLOGY REPORT*  Clinical Data: Altered mental status, possible seizure  CT HEAD WITHOUT CONTRAST   Technique:  Contiguous axial images were obtained from the base of the skull through the vertex without contrast.  Comparison: 02/03/2012  Findings: Motion degraded images.  No evidence of parenchymal hemorrhage or extra-axial fluid collection. No mass lesion, mass effect, or midline shift.  No CT evidence of acute infarction.  Subcortical white matter and periventricular small vessel ischemic changes.  Intracranial atherosclerosis.  Cerebral volume is age appropriate.  No ventriculomegaly.  The visualized paranasal sinuses are essentially clear. The mastoid air cells are unopacified.  No evidence of calvarial fracture.  IMPRESSION: Motion degraded images.  No interval change from recent CT.   Original Report Authenticated By: Charline Bills, M.D.    Ct Head Wo Contrast  02/03/2012  *RADIOLOGY REPORT*  Clinical Data:  Found down.  Weakness and confusion.  CT HEAD WITHOUT CONTRAST CT CERVICAL SPINE WITHOUT CONTRAST  Technique:  Multidetector CT imaging of the head and cervical spine was performed following the standard protocol without intravenous contrast.  Multiplanar CT image reconstructions of the cervical spine were also generated.  Comparison:  11/22/2011.  CT HEAD  Findings: The ventricles are in the midline without mass effect or shift.  There are stable in size and configuration.  There is advanced periventricular white matter disease for age.  No extra- axial fluid collections identified.  No CT findings for acute hemispheric infarction and/or intracranial hemorrhage.  No mass lesions.  The brainstem and cerebellum grossly normal and stable.  The bony structures are intact.  The paranasal sinuses and mastoid air cells are clear.  IMPRESSION:  1.  Stable age advanced periventricular white matter disease. 2.  No acute intracranial findings or mass lesions.  CT CERVICAL SPINE  Findings: Examination is degraded by motion artifact but the overall alignment of the cervical vertebral bodies is normal and no  acute fractures identified.  There is a remote ununited spinous process fracture of T1.  IMPRESSION: Normal alignment and no acute bony findings.   Original Report Authenticated By: Rudie Meyer, M.D.    Ct Cervical Spine Wo Contrast  02/03/2012  *RADIOLOGY REPORT*  Clinical Data:  Found down.  Weakness and confusion.  CT HEAD WITHOUT CONTRAST CT CERVICAL SPINE WITHOUT CONTRAST  Technique:  Multidetector CT imaging of the head and cervical spine was performed following the standard protocol without intravenous contrast.  Multiplanar CT image reconstructions of the cervical spine were also generated.  Comparison:  11/22/2011.  CT HEAD  Findings: The ventricles are in the midline without mass effect or shift.  There are stable in size and configuration.  There is advanced periventricular white matter disease for age.  No extra- axial fluid collections identified.  No CT findings for acute hemispheric infarction and/or intracranial hemorrhage.  No mass lesions.  The brainstem and cerebellum grossly normal and stable.  The bony structures are intact.  The paranasal sinuses and mastoid air cells are clear.  IMPRESSION:  1.  Stable age advanced periventricular white matter disease. 2.  No acute intracranial findings or mass lesions.  CT CERVICAL SPINE  Findings: Examination is degraded by motion artifact but the overall alignment of the cervical vertebral bodies is normal and no acute fractures identified.  There is a remote ununited spinous process fracture of T1.  IMPRESSION: Normal alignment and no acute bony findings.   Original Report Authenticated By: Rudie Meyer, M.D.    Dg Pelvis Portable  02/03/2012  *RADIOLOGY REPORT*  Clinical Data: Larey Seat.  PORTABLE PELVIS  Comparison: None  Findings: The hips are normally located.  No acute hip fracture. The pubic symphysis and SI joints are intact.  No pelvic fractures.  IMPRESSION: No acute bony findings.   Original Report Authenticated By: Rudie Meyer, M.D.    Dg  Chest Port 1 View  02/03/2012  *RADIOLOGY REPORT*  Clinical Data: Found down.  Confused.  PORTABLE CHEST - 1 VIEW  Comparison: 11/12/2011.  Findings: The cardiac silhouette, mediastinal and hilar contours are stable given the supine position of the patient.  The lungs are grossly clear.  The bony thorax is intact.  Remote healed rib fractures are noted.  IMPRESSION: No acute cardiopulmonary findings and intact bony thorax.   Original Report Authenticated By: Rudie Meyer, M.D.     Medications:  I have reviewed the patient's current medications. Scheduled:   . aspirin EC  81 mg Oral Daily  . collagenase   Topical Daily  . doxycycline (VIBRAMYCIN) IV  100 mg Intravenous Q12H  . folic acid  1 mg Oral Daily  . heparin  5,000 Units Subcutaneous Q8H  . lisinopril  10 mg Oral Daily   And  . hydrochlorothiazide  12.5 mg Oral Daily  . insulin aspart  0-9 Units Subcutaneous TID WC  . levetiracetam  500 mg Intravenous Q12H  . magnesium oxide  400 mg Oral Daily  . methotrexate  7.5 mg Oral Weekly  . niacin  1,000 mg Oral QHS  . nicotine  14 mg Transdermal Daily  . oxybutynin  5 mg Oral TID  . potassium chloride SA  20 mEq Oral Daily  . potassium chloride  40 mEq Oral Once  . predniSONE  5 mg Oral QAC breakfast  . sodium chloride  3 mL Intravenous Q12H  . thiamine  100 mg Oral Daily  . torsemide  10 mg Oral Daily    Assessment/Plan:  It remains unclear why the patient fell.  May have had a seizure at that time as well.  Clearly with seizure after presentation.  If not reason for fall patient has ben seizure free for a period of time and the head injury may have precipitated the seizures.  Patient at this point on Keppra/Dilantin and seems to be tolerating well.  Patient did develop afib with RVR and would like to rule out a small ischemic event.    Recommendations: 1.  Continue Dilantin and Keppra at current doses.  May change to po once able to tolerate po 2.  MRI of the brain     LOS:  1 day   Thana Farr, MD [facility] [phone] 02/04/2012  9:48 AM

## 2012-02-04 NOTE — Progress Notes (Signed)
FMTS Attending Daily Note:  Renold Don MD  [phone] pager  Family Practice pager:  [phone] I have discussed this patient with the resident Paulina Fusi and attending physician Dr. Sheffield Slider.  I agree with their findings, assessment, and care plan

## 2012-02-04 NOTE — Progress Notes (Signed)
Patients hr decreases while sleeping 40-50, asymptomatic.  Spoke with Dr. Tenny Craw at bedside. Ok to turn alarm down to 40.

## 2012-02-04 NOTE — Progress Notes (Signed)
Patient is climbing out of bed, yelling at nurse and staff that he must go out to smoke.  He is also stating he needs to get oob and go home, very anxious and easily agitated. Put patient back in bed with rn observing while md is notified.
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
## 2012-02-04 NOTE — Progress Notes (Signed)
PT/OT Cancellation Note  Patient Details Name: Patrick Holt MRN: [id number] DOB: March 09, 1950   Cancelled Treatment:    Reason Eval/Treat Not Completed: Medical issues which prohibited therapy (pt with K 2.7 and has only received one dose of K. Pt also received Haldol and is unable to participate in therapy at this time. Per RN pt uses motorized WC at home, lives alone and has RW. Anticipate pt will need further care at discharge but will hold formal eval til next date.  Delaney Meigs, PT [phone]

## 2012-02-04 NOTE — Progress Notes (Addendum)
Pt. cont. to be responsive when awake, arousable and oriented x3. Complained of mid chest pain, BP stable noted HR drops in the 40's, 12 lead EKG done. Dr. Pollie Meyer made aware and assessed pt,. denies pain when approached by the MD. Will cont to monitor.

## 2012-02-04 NOTE — Consult Note (Signed)
Reason for Consult:Altered Mental Status Referring Physician: Mariane Duval  CC: Altered Mental Status  History is obtained from:Patient, primary team.   HPI: Patrick Holt is a 61 y.o. male who fell earlier today. He was found down, and had some confusion after the fall. He did have an abrasion on his forehead and was brought in as a trauma. He had a head CT downstairs that was negative. He was noted downstairs to be slightly slow to speak. He had a "tonic-clonic seizure"in the emergency room. He was given a gram of phenytoin and then started on Keppra. Per the chart, he has a history of seizures, but no seizures within the past year.  He reports only a single half glass of wine per day. He takes Xanax at baseline, but has been out of his prescription for at least several weeks. He was given a milligram of Xanax this evening at 10:30pm as it is his home dose.  He went into afib with RVR and was transferred to 3W to start a drip, but converted. Sometime between 11 and 12, it was noticed that he was not as arousable as he had been earlier and there was concern over some shaking movements of his legs.   ROS: Unable to obtain due to AMS  Past Medical History  Diagnosis Date  . Rheumatoid arthritis   . Obesity   . Major depression     with acute psychotic break in 06/2010  . Diabetes mellitus   . Hypertension   . Hyperlipidemia   . Diverticulosis of colon   . COPD (chronic obstructive pulmonary disease)   . Anxiety   . GERD (gastroesophageal reflux disease)   . Vertigo   . Seizures   . Fibromyalgia   . Dermatomyositis   . Myocardial infarct     mulitple (1999, 2000, 2004)  . Raynaud's disease   . Narcotic dependence   . Peripheral neuropathy   . Internal hemorrhoids   . Ischemic heart disease   . Hiatal hernia   . Gastritis   . Diverticulitis   . Hx of adenomatous colonic polyps   . Nephrolithiasis   . Anemia   . Esophageal stricture   . Esophageal dysmotility   .  Dermatomyositis   . Paroxysmal a-fib   . Urge incontinence   . Otosclerosis     Family History: Unable to obtain due to AMS  Social History: Tob: Per chart, smokes, drinks one half glass red wine each day  Exam: Current vital signs: BP 121/78  Pulse 71  Temp 97.6 F (36.4 C) (Oral)  Resp 18  Ht 5\' 9"  (1.753 m)  Wt 105.5 kg (232 lb 9.4 oz)  BMI 34.35 kg/m2  SpO2 97% Vital signs in last 24 hours: Temp:  [97.6 F (36.4 C)-98.7 F (37.1 C)] 97.6 F (36.4 C) (12/19 2320) Pulse Rate:  [56-92] 71  (12/19 2320) Resp:  [15-18] 18  (12/19 2141) BP: (121-189)/(72-94) 121/78 mmHg (12/19 2320) SpO2:  [90 %-98 %] 97 % (12/19 2320) Weight:  [105.5 kg (232 lb 9.4 oz)] 105.5 kg (232 lb 9.4 oz) (12/19 2320)  General: In bed, asleep CV: RRR Mental Status: Patient is lethargic, but once roused he is able to tell me the year, location and name.  He follows commands when asked with noxious stimuli but is very uncooperative, he insists we take him "down to the corner" in a wheelchair or give him a patch.  He is dysarthric, with a speech pattern resembling someone  who is drunk When eyes are opened passively, he has a bell's phenomenon.  Cranial Nerves: II: Fixates and trakcs Pupils are equal, round, and reactive to light.  Discs are not visualizable due tp . III,IV, VI: tracks across midline in both directions.   V,VII: blinks to eyelash stimuli VIII: hearing is intact to voice X,XI,XII: not assessed due to patient compliance Motor: Tone is normal. Bulk is normal. He moves all extremities with good strength. He does intermittently stretch out his legs bilaterally, but if pinched, this movement is aborted.  Sensory: Responds to nox stim in all 4 ext.  Deep Tendon Reflexes: Difficult to elicit as patient resists attempts to check  Cerebellar: Unable to assess due to patient compliance Gait: Unable to assess due to patient compliance.   I have reviewed labs in epic and the results  pertinent to this consultation are: Ammonia 45 CBC - elevated MCV tox screen negative etoh level negative  I have reviewed the images obtained:CT head - unchanged from earlier  Impression: 61 yo M with history of seizures who presents after several seizures earlier today. His current decline in mental status has the character of intoxication and a non-focal exam. I suspect that it is related to his evening dose of xanax, however a post-ictal contribution is possible, though I am not certain that he has had any seizure activity tonight.   He received a < 10 mg/kg dose of dilantin in the ER and has received a single dose of 500mg  of keppra. This is likely to be subtheraputic doses of both of these medicines and I would recommend a load of keppra to ensure that it is theraputic.   Recommendations: 1) Have repeated CT head, appears unchanged.  2) Rule out possible contributing factors to MS, TSH, B12, consider cortisol given  3) Agree with thiamine supplementation 4) Would give single dose of 1.5 gm keppra, then continue at 500mg  BID.  5) abg 6) If above normal, would observe as I suspect this is medication related.    Ritta Slot, MD [facility] [phone]  If 7pm- 7am, please page neurology on call at [phone].

## 2012-02-04 NOTE — Progress Notes (Signed)
Patient Identification:  TARRANCE JANUSZEWSKI Date of Evaluation:  02/04/2012 Reason for Consult:  Witnessed fall; r/o seiure  Referring Provider: Dr. Warner Mccreedy [phone]  History of Present Illness:Consult request is potponed cue to pt receiving Haldol IV just before consult was recorded.  Pt is sedated.   RECOMMENDATION:  1.  Consultation is pending when pt can participate. Mickeal Skinner MD 02/04/2012 11:55 PM

## 2012-02-04 NOTE — Progress Notes (Signed)
Patient complaining that he is very hungry and thirsty. Performed bedside swallow, patient passed. Put patient in for Carb Mod diet. Discussed with Dr. Sheffield Slider. Patients tooth has come out, patient with very poor dentition. Kcl is 2.7, notified dr Gwendolyn Grant. Spent 45 mins with patient.

## 2012-02-04 NOTE — Progress Notes (Signed)
Family Practice Teaching Service  Interim Progress Note  Called the 3W floor to inquire about patient's current rhythm on telemetry. He had not been started yet on diltiazem drip. Was transferred to RN, who reported concern for current seizure activity, as level of alertness was rapidly changing; pt would close his eyes and jerk legs rhythmically briefly before beginning to speak again.  Went to bedside with upper level Dr. Tye Savoy. Pt's telemetry now showed normal sinus rhythm, so diltiazem drip was cancelled. Pt was notably sedated, but would awaken to loud voice. Of note, he had been given 1mg  of Xanax prior to transfer down from 4N to 3W. On exam we noted that his RLE had erythema and warmth over the shin, just distal to the quarter-sized old healing wound noted upon admission exam. DP pulses palpable bilaterally, and were regular in rate and rhythm.   Ordered blood cultures be drawn out of concern for right lower extremity cellulitis, with the plan of initiating antibiotics after cultures were drawn. Also called to obtain neurology consult, out of concern for continued seizure activity. Pt was then seen and discussed with Dr. Amada Jupiter of neurology, who recommended obtaining a stat head CT to rule out evolving epidural hematoma. This has been done and was negative for any acute findings. A CBG was also obtained, which was 120.  Dr. Alene Mires neurological exam was nonfocal and not concerning for current seizure activity, and he believes that pt's current altered mental status is likely secondary to receiving Xanax 1mg  prior to his transfer to 3W. I agree with this assessment. Per his recommendations, will also check an ABG, TSH, and B12 level and give another Keppra loading dose of 1.5g.   Will transfer patient to unit 2900 (stepdown) for closer monitoring with q1h neuro checks. Will initiate doxycycline 100mg  IV BID to treat possible RLE cellulitis. ABG reviewed - slightly alkalotic with a  pH of 7.47, normal CO2, and elevated bicarb at 29, likely secondary to vomiting. Continue to monitor on telemetry with continuous pulse oxymetry. Will also make patient NPO until a formal SLP eval can be done.  Levert Feinstein, MD Family Medicine PGY-1 Pager [phone]

## 2012-02-04 NOTE — Evaluation (Signed)
Clinical/Bedside Swallow Evaluation Patient Details  Name: Patrick Holt MRN: [id number] Date of Birth: 1950-05-15  Today's Date: 02/04/2012 Time: 4098-1191 SLP Time Calculation (min): 18 min  Past Medical History:  Past Medical History  Diagnosis Date  . Rheumatoid arthritis   . Obesity   . Major depression     with acute psychotic break in 06/2010  . Diabetes mellitus   . Hypertension   . Hyperlipidemia   . Diverticulosis of colon   . COPD (chronic obstructive pulmonary disease)   . Anxiety   . GERD (gastroesophageal reflux disease)   . Vertigo   . Seizures   . Fibromyalgia   . Dermatomyositis   . Myocardial infarct     mulitple (1999, 2000, 2004)  . Raynaud's disease   . Narcotic dependence   . Peripheral neuropathy   . Internal hemorrhoids   . Ischemic heart disease   . Hiatal hernia   . Gastritis   . Diverticulitis   . Hx of adenomatous colonic polyps   . Nephrolithiasis   . Anemia   . Esophageal stricture   . Esophageal dysmotility   . Dermatomyositis   . Paroxysmal a-fib   . Urge incontinence   . Otosclerosis    Past Surgical History:  Past Surgical History  Procedure Date  . Knee arthroscopy w/ meniscal repair 2009    left  . Lumbar disc surgery   . Squamous papilloma  2010    removed by Dr. Pollyann Kennedy ENT, noted on tongue   HPI:  61 yr old admitted after fall and AMS with witnessed seizure.  PMH:  fibromyalgia, COPD, GERD, dermatomyositis, hiatal hernia, esophageal dysmotility.  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  Esophagram 2000, esophageal dilation.   Assessment / Plan / Recommendation Clinical Impression  Swallow assessment wasslightly limited secondary to pt's verbosity and visits from wound RN and MD.  Oral prep, manipulation with puree and thin boluses appeared functional.  Solid texture not attempted due to pt. losing a tooth prior to SLP arrival and reluctance to masticate cracker at this time.   Pharyngeal swallow appeared functional, however aspiration risk is increased given history of dermatomyositis and esophageal deficits.  SLP recommends texture downgrade to Dys 3 and thin liquids.  Small sips with straw and pills with liquid.  ST will continue to follow for continued observation, education and efficiency with Dys 3 diet texture.     Aspiration Risk  Moderate    Diet Recommendation Dysphagia 3 (Mechanical Soft);Thin liquid   Liquid Administration via: Cup;Straw Medication Administration: Whole meds with liquid Supervision: Patient able to self feed;Intermittent supervision to cue for compensatory strategies Compensations: Slow rate;Small sips/bites Postural Changes and/or Swallow Maneuvers: Seated upright 90 degrees    Other  Recommendations Oral Care Recommendations: Oral care BID   Follow Up Recommendations   (to be determined)    Frequency and Duration min 2x/week  1 week        SLP Swallow Goals Patient will utilize recommended strategies during swallow to increase swallowing safety with: Minimal cueing   Swallow Study Prior Functional Status          Oral/Motor/Sensory Function Overall Oral Motor/Sensory Function: Appears within functional limits for tasks assessed   Ice Chips Ice chips: Not tested   Thin Liquid Thin Liquid: Within functional limits Presentation: Cup;Straw    Nectar Thick Nectar Thick Liquid: Not tested   Honey Thick Honey Thick Liquid: Not tested   Puree Puree:  Within functional limits   Solid       Solid: Not tested (due to poor dentition, pt. reluctant to attempt)       Darrow Bussing.Ed [employer] [phone]  02/04/2012

## 2012-02-04 NOTE — Progress Notes (Signed)
VASCULAR LAB PRELIMINARY  PRELIMINARY  PRELIMINARY  PRELIMINARY  Right lower extremity venous duplex completed.    Preliminary report:  Right:  No evidence of DVT, superficial thrombosis, or Baker's cyst.  Patrick Holt, RVT 02/04/2012, 9:06 AM
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
## 2012-02-04 NOTE — Progress Notes (Signed)
At approximately 23:45, patient was transferred to 3W21 from [facility] for rapid heart rate to start on diltiazem drip. On transfer, patient was alert, oriented and very talkative with stable vital signs (normal for patient). He was in a normal sinus rhythm with rate from 65-75.  He was giving a verbal history when he abruptly stopped talking, his eyes rolled up, his face became very reddened and his breathing became very deep and slow. Vital signs were obtained and were unchanged from previous values. After 10 - 15 seconds, he regained his normal LOC and continued talking. The verbal history he resumed was non-linear; ideas pre and post-event were completely separate. Several similar brief episodes occurred over the course of the next 10 minutes.  Rapid response was made aware of the patient's apparent neurological changes from baseline; assessment was deferred based on my clinical judgment that he was stable for the time being. During this conversation, Dr. Pollie Meyer with [facility] called and was also updated on his condition. She quickly came to the floor to assess Mr. Espinal.  Since he was in a normal sinus rhythm, and had not experienced any high rates since transfer, a verbal order was given to hold the diltiazem drip and to seek a transfer to a stepdown bed for closer observation of his neuro status. Dr. Amada Jupiter with [facility] was consulted and responded to the room also. Repeated vital signs and a CBG were requested and obtained; also within normal limits for the patient. Continuous SpO2 monitoring was started at the bedside; O2 saturation was 93-96% on room air. Orders were received for a STAT CT of the head and multiple labs, including blood cultures. These were obtained immediately by the phlebotomist, who was already present in the room.  After labs were drawn, the patient was taken to CT, report was called to [facility] at 2900 and the patient was transferred from CT  to 2924 just after 1:00 AM.

## 2012-02-04 NOTE — Progress Notes (Signed)
Received pt. From 3W  after CT scan was done. Pt. Awake  and conversant, with some inappropriate words. Follows simple command and orientedx3. Placed in the monitor and on neuro check every hour.

## 2012-02-05 ENCOUNTER — Encounter (HOSPITAL_COMMUNITY): Payer: Self-pay | Admitting: *Deleted

## 2012-02-05 DIAGNOSIS — I6200 Nontraumatic subdural hemorrhage, unspecified: Secondary | ICD-10-CM

## 2012-02-05 DIAGNOSIS — S065X9A Traumatic subdural hemorrhage with loss of consciousness of unspecified duration, initial encounter: Secondary | ICD-10-CM | POA: Diagnosis present

## 2012-02-05 DIAGNOSIS — R4182 Altered mental status, unspecified: Secondary | ICD-10-CM

## 2012-02-05 DIAGNOSIS — I2510 Atherosclerotic heart disease of native coronary artery without angina pectoris: Secondary | ICD-10-CM

## 2012-02-05 DIAGNOSIS — I4891 Unspecified atrial fibrillation: Secondary | ICD-10-CM

## 2012-02-05 LAB — GLUCOSE, CAPILLARY
Glucose-Capillary: 183 mg/dL — ABNORMAL HIGH (ref 70–99)
Glucose-Capillary: 95 mg/dL (ref 70–99)

## 2012-02-05 MED ORDER — LORAZEPAM 2 MG/ML IJ SOLN
1.0000 mg | Freq: Three times a day (TID) | INTRAMUSCULAR | Status: DC | PRN
  Administered 2012-02-05: 1 mg via INTRAVENOUS
  Filled 2012-02-05: qty 1

## 2012-02-05 MED ORDER — AMIODARONE IV BOLUS ONLY 150 MG/100ML
150.0000 mg | Freq: Once | INTRAVENOUS | Status: AC
  Administered 2012-02-05: 150 mg via INTRAVENOUS

## 2012-02-05 MED ORDER — LORAZEPAM 2 MG/ML IJ SOLN
0.5000 mg | Freq: Three times a day (TID) | INTRAMUSCULAR | Status: DC | PRN
  Administered 2012-02-06 – 2012-02-07 (×3): 0.5 mg via INTRAVENOUS
  Filled 2012-02-05 (×3): qty 1

## 2012-02-05 MED ORDER — DOXYCYCLINE HYCLATE 100 MG PO TABS
100.0000 mg | ORAL_TABLET | Freq: Two times a day (BID) | ORAL | Status: DC
  Administered 2012-02-05 – 2012-02-08 (×6): 100 mg via ORAL
  Filled 2012-02-05 (×8): qty 1

## 2012-02-05 MED ORDER — LEVETIRACETAM 500 MG PO TABS
500.0000 mg | ORAL_TABLET | Freq: Two times a day (BID) | ORAL | Status: DC
  Administered 2012-02-05 – 2012-02-08 (×7): 500 mg via ORAL
  Filled 2012-02-05 (×9): qty 1

## 2012-02-05 MED ORDER — LEVETIRACETAM 500 MG PO TABS
500.0000 mg | ORAL_TABLET | Freq: Two times a day (BID) | ORAL | Status: DC
  Filled 2012-02-05 (×2): qty 1

## 2012-02-05 NOTE — Progress Notes (Signed)
Dr. Jerolyn Center was paged and made aware of the result of the MRI.

## 2012-02-05 NOTE — Progress Notes (Signed)
PHARMACIST - PHYSICIAN COMMUNICATION DR:   Gwendolyn Grant CONCERNING: Antibiotic IV to Oral Route Change Policy  RECOMMENDATION: This patient is receiving Doxycycline by the intravenous route.  Based on criteria approved by the Pharmacy and Therapeutics Committee, the antibiotic(s) is/are being converted to the equivalent oral dose form(s).   DESCRIPTION: These criteria include:  Patient being treated for a respiratory tract infection, urinary tract infection, or cellulitis  The patient is not neutropenic and does not exhibit a GI malabsorption state  The patient is eating (either orally or via tube) and/or has been taking other orally administered medications for a least 24 hours  The patient is improving clinically and has a Tmax < 100.5  If you have questions about this conversion, please contact the Pharmacy Department  []   [phone] )  [facility] [x]   [phone] )  [facility]  []   [phone] )  [facility] []   [phone] )  [facility]     Georgina Pillion, PharmD, BCPS 02/05/2012 2:50 PM

## 2012-02-05 NOTE — Progress Notes (Signed)
Subjective: Patient awake and alert.  Amnestic of many of the previous events including much of yesterday.  Less agitated today and speech no longer pressured.  MRI of the brain reviewed and shows tiny bilateral frontal subdural hemorrhages and subarachnoid hemorrhage.  No evidence of trauma on imaging but patient clinically with frontal abrasions and hemorrhage likely secondary to fall.  This may have also led to increased seizure activity as well.    Objective: Current vital signs: BP 147/80  Pulse 69  Temp 97.9 F (36.6 C) (Oral)  Resp 16  Ht 5\' 9"  (1.753 m)  Wt 105.5 kg (232 lb 9.4 oz)  BMI 34.35 kg/m2  SpO2 97% Vital signs in last 24 hours: Temp:  [97.6 F (36.4 C)-99.3 F (37.4 C)] 97.9 F (36.6 C) (12/21 0808) Pulse Rate:  [37-90] 69  (12/21 0810) Resp:  [3-23] 16  (12/21 0810) BP: (108-155)/(66-98) 147/80 mmHg (12/21 0801) SpO2:  [93 %-100 %] 97 % (12/21 0810)  Intake/Output from previous day: 12/20 0701 - 12/21 0700 In: 993.2 [P.O.:660; I.V.:333.2] Out: 1925 [Urine:1925] Intake/Output this shift:   Nutritional status: Dysphagia  Neurologic Exam: Mental Status:  Alert. Able to give me detail about his history and recent events. Speech fluent without evidence of aphasia. Able to follow 3 step commands without difficulty.  Cranial Nerves:  II: Discs flat bilaterally; Visual fields grossly normal, pupils equal, round, reactive to light and accommodation  III,IV, VI: right ptosis likely secondary to trauma, extra-ocular motions intact bilaterally  V,VII: smile symmetric, facial light touch sensation normal bilaterally  VIII: hearing normal bilaterally  IX,X: gag reflex present  XI: bilateral shoulder shrug  XII: midline tongue extension  Motor:  Right : Upper extremity 5/5         Left: Upper extremity 5/5   Lower extremity 5/5       Lower extremity 5/5  Sensory: Pinprick and light touch intact throughout, bilaterally  Deep Tendon Reflexes: 2+ in the upper  extremities, trace at the knees and absent at the ankles  Plantars:  Right: downgoing      Left: downgoing    Lab Results: Basic Metabolic Panel:  Lab 02/04/12 1610 02/04/12 0600 02/03/12 1819 02/03/12 1143  NA 140 139 -- 140  K 3.9 2.7* -- 3.1*  CL 102 101 -- 99  CO2 26 28 -- 22  GLUCOSE 118* 105* -- 111*  BUN 5* 4* -- 8  CREATININE 0.71 0.78 0.57 0.81  CALCIUM 9.3 8.7 -- 9.0  MG -- -- -- --  PHOS -- -- -- --    Liver Function Tests:  Lab 02/03/12 1143  AST 18  ALT 16  ALKPHOS 75  BILITOT 0.5  PROT 6.7  ALBUMIN 3.4*   No results found for this basename: LIPASE:5,AMYLASE:5 in the last 168 hours  Lab 02/03/12 1819  AMMONIA 46    CBC:  Lab 02/04/12 0600 02/03/12 1819 02/03/12 1143  WBC 8.8 9.5 13.5*  NEUTROABS -- -- 8.0*  HGB 15.0 14.6 15.3  HCT 44.6 44.8 46.7  MCV 103.7* 101.6* 106.4*  PLT 240 255 277    Cardiac Enzymes:  Lab 02/04/12 0606 02/04/12 0022 02/03/12 1819 02/03/12 1143  CKTOTAL -- -- -- --  CKMB -- -- -- --  CKMBINDEX -- -- -- --  TROPONINI <0.30 <0.30 <0.30 <0.30    Lipid Panel: No results found for this basename: CHOL:5,TRIG:5,HDL:5,CHOLHDL:5,VLDL:5,LDLCALC:5 in the last 168 hours  CBG:  Lab 02/05/12 0809 02/04/12 2142 02/04/12 1720 02/04/12 1227 02/04/12 0804  GLUCAP 105* 194* 125* 119* 103*    Microbiology: Results for orders placed during the hospital encounter of 02/03/12  MRSA PCR SCREENING     Status: Normal   Collection Time   02/04/12  1:28 AM      Component Value Range Status Comment   MRSA by PCR NEGATIVE  NEGATIVE Final     Coagulation Studies:  Basename 02/03/12 1143  LABPROT 13.4  INR 1.03    Imaging: Dg Lumbar Spine Complete  02/03/2012  *RADIOLOGY REPORT*  Clinical Data: Fall.  Low back pain.  LUMBAR SPINE - COMPLETE 4+ VIEW  Comparison: 11/12/2011, MRI 02/18/2011  Findings: Grade 1 slip L5 on S1 is stable from prior studies. There are bilateral pars defects of L5.  Remaining lumbar alignment is normal.   No acute fracture.  Mild disc degeneration and anterior spurring L2-3 and L3-4.  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  No acute fracture.   Original Report Authenticated By: Janeece Riggers, M.D.    Ct Head Wo Contrast  02/04/2012  *RADIOLOGY REPORT*  Clinical Data: Altered mental status, possible seizure  CT HEAD WITHOUT CONTRAST  Technique:  Contiguous axial images were obtained from the base of the skull through the vertex without contrast.  Comparison: 02/03/2012  Findings: Motion degraded images.  No evidence of parenchymal hemorrhage or extra-axial fluid collection. No mass lesion, mass effect, or midline shift.  No CT evidence of acute infarction.  Subcortical white matter and periventricular small vessel ischemic changes.  Intracranial atherosclerosis.  Cerebral volume is age appropriate.  No ventriculomegaly.  The visualized paranasal sinuses are essentially clear. The mastoid air cells are unopacified.  No evidence of calvarial fracture.  IMPRESSION: Motion degraded images.  No interval change from recent CT.   Original Report Authenticated By: Charline Bills, M.D.    Ct Head Wo Contrast  02/03/2012  *RADIOLOGY REPORT*  Clinical Data:  Found down.  Weakness and confusion.  CT HEAD WITHOUT CONTRAST CT CERVICAL SPINE WITHOUT CONTRAST  Technique:  Multidetector CT imaging of the head and cervical spine was performed following the standard protocol without intravenous contrast.  Multiplanar CT image reconstructions of the cervical spine were also generated.  Comparison:  11/22/2011.  CT HEAD  Findings: The ventricles are in the midline without mass effect or shift.  There are stable in size and configuration.  There is advanced periventricular white matter disease for age.  No extra- axial fluid collections identified.  No CT findings for acute hemispheric infarction and/or intracranial hemorrhage.  No mass lesions.  The brainstem and cerebellum grossly normal and stable.   The bony structures are intact.  The paranasal sinuses and mastoid air cells are clear.  IMPRESSION:  1.  Stable age advanced periventricular white matter disease. 2.  No acute intracranial findings or mass lesions.  CT CERVICAL SPINE  Findings: Examination is degraded by motion artifact but the overall alignment of the cervical vertebral bodies is normal and no acute fractures identified.  There is a remote ununited spinous process fracture of T1.  IMPRESSION: Normal alignment and no acute bony findings.   Original Report Authenticated By: Rudie Meyer, M.D.    Ct Cervical Spine Wo Contrast  02/03/2012  *RADIOLOGY REPORT*  Clinical Data:  Found down.  Weakness and confusion.  CT HEAD WITHOUT CONTRAST CT CERVICAL SPINE WITHOUT CONTRAST  Technique:  Multidetector CT imaging of the head and cervical spine was performed following the standard protocol without intravenous contrast.  Multiplanar CT  image reconstructions of the cervical spine were also generated.  Comparison:  11/22/2011.  CT HEAD  Findings: The ventricles are in the midline without mass effect or shift.  There are stable in size and configuration.  There is advanced periventricular white matter disease for age.  No extra- axial fluid collections identified.  No CT findings for acute hemispheric infarction and/or intracranial hemorrhage.  No mass lesions.  The brainstem and cerebellum grossly normal and stable.  The bony structures are intact.  The paranasal sinuses and mastoid air cells are clear.  IMPRESSION:  1.  Stable age advanced periventricular white matter disease. 2.  No acute intracranial findings or mass lesions.  CT CERVICAL SPINE  Findings: Examination is degraded by motion artifact but the overall alignment of the cervical vertebral bodies is normal and no acute fractures identified.  There is a remote ununited spinous process fracture of T1.  IMPRESSION: Normal alignment and no acute bony findings.   Original Report Authenticated By: Rudie Meyer, M.D.    Mr Brain Wo Contrast  02/04/2012  *RADIOLOGY REPORT*  Clinical Data: Seizure.  Dizziness.  Possible loss of consciousness.  Fall from wheelchair of trauma head.  MRI HEAD WITHOUT CONTRAST  Technique:  Multiplanar, multiecho pulse sequences of the brain and surrounding structures were obtained according to standard protocol without intravenous contrast.  Comparison: CT head without contrast 02/04/2012.  MRI brain 07/07/2010.  Findings: Small subarachnoid or subdural anterior frontal hematomas are evident bilaterally.  There is no significant mass effect. Atrophy and extensive white matter disease is otherwise stable.  No acute infarct, parenchymal hemorrhage, or mass lesion is present.  Flow is present in the major intracranial arteries.  The patient is status post left lens extraction.  The paranasal sinuses and mastoid air cells are clear.  IMPRESSION:  1.  Tiny bilateral frontal subarachnoid or subdural hemorrhages without significant underlying trauma. 2.  Moderate atrophy white matter disease is stable.  Critical Value/emergent results were called by telephone at the time of interpretation on 02/04/2012 at 08:40 p.m. to [facility], R.N., who verbally acknowledged these results.   Original Report Authenticated By: Marin Roberts, M.D.    Dg Pelvis Portable  02/03/2012  *RADIOLOGY REPORT*  Clinical Data: Larey Seat.  PORTABLE PELVIS  Comparison: None  Findings: The hips are normally located.  No acute hip fracture. The pubic symphysis and SI joints are intact.  No pelvic fractures.  IMPRESSION: No acute bony findings.   Original Report Authenticated By: Rudie Meyer, M.D.    Dg Chest Port 1 View  02/03/2012  *RADIOLOGY REPORT*  Clinical Data: Found down.  Confused.  PORTABLE CHEST - 1 VIEW  Comparison: 11/12/2011.  Findings: The cardiac silhouette, mediastinal and hilar contours are stable given the supine position of the patient.  The lungs are grossly clear.  The bony thorax is  intact.  Remote healed rib fractures are noted.  IMPRESSION: No acute cardiopulmonary findings and intact bony thorax.   Original Report Authenticated By: Rudie Meyer, M.D.     Medications:  I have reviewed the patient's current medications. Scheduled:   . amiodarone  150 mg Intravenous Once  . aspirin EC  81 mg Oral Daily  . collagenase   Topical Daily  . doxycycline (VIBRAMYCIN) IV  100 mg Intravenous Q12H  . folic acid  1 mg Oral Daily  . lisinopril  10 mg Oral Daily   And  . hydrochlorothiazide  12.5 mg Oral Daily  . insulin aspart  0-9 Units Subcutaneous  TID WC  . levetiracetam  500 mg Intravenous Q12H  . magnesium oxide  400 mg Oral Daily  . methotrexate  7.5 mg Oral Weekly  . niacin  1,000 mg Oral QHS  . nicotine  21 mg Transdermal Daily  . oxybutynin  5 mg Oral TID  . potassium chloride SA  20 mEq Oral Daily  . predniSONE  5 mg Oral QAC breakfast  . sodium chloride  3 mL Intravenous Q12H  . thiamine  100 mg Oral Daily  . torsemide  10 mg Oral Daily    Assessment/Plan: Seizure   Assessment:  Patient much improved today.  Mental status on yesterday may very well have been pst-ictal and/or related to frontal head trauma.  Patient without further seizures.  On Keppra and tolerating well.     Plan:  1. Change Keppra to po 500mg  BID SDH   Assessment:  Small on imaging and likely related to trauma   Plan:  1. No intervention recommended at this time.      LOS: 2 days   Thana Farr, MD [facility] [phone] 02/05/2012  9:37 AM

## 2012-02-05 NOTE — Progress Notes (Addendum)
Subjective:   61 y/o with CAD, ETOH abuse. Admitted with seizures and SAH/SDH after fall.   Has been in and out of AF. Failed diltiazem. Started on amio. Also with episodes of bradycardia. Now back in AF with RVR.   C/o back pain. No CP, SOB or palpitations.  Tele: Alternating between SR in 50s and AF 140-150s.    Intake/Output Summary (Last 24 hours) at 02/05/12 1104 Last data filed at 02/05/12 0400  Gross per 24 hour  Intake  543.2 ml  Output   1300 ml  Net -756.8 ml    Current meds:    . amiodarone  150 mg Intravenous Once  . aspirin EC  81 mg Oral Daily  . collagenase   Topical Daily  . doxycycline (VIBRAMYCIN) IV  100 mg Intravenous Q12H  . folic acid  1 mg Oral Daily  . lisinopril  10 mg Oral Daily   And  . hydrochlorothiazide  12.5 mg Oral Daily  . insulin aspart  0-9 Units Subcutaneous TID WC  . levETIRAcetam  500 mg Oral BID  . magnesium oxide  400 mg Oral Daily  . methotrexate  7.5 mg Oral Weekly  . niacin  1,000 mg Oral QHS  . nicotine  21 mg Transdermal Daily  . oxybutynin  5 mg Oral TID  . potassium chloride SA  20 mEq Oral Daily  . predniSONE  5 mg Oral QAC breakfast  . sodium chloride  3 mL Intravenous Q12H  . thiamine  100 mg Oral Daily  . torsemide  10 mg Oral Daily   Infusions:    . amiodarone (NEXTERONE PREMIX) 360 mg/200 mL dextrose Stopped (02/05/12 0956)     Objective:  Blood pressure 154/80, pulse 62, temperature 97.9 F (36.6 C), temperature source Oral, resp. rate 11, height 5\' 9"  (1.753 m), weight 105.5 kg (232 lb 9.4 oz), SpO2 98.00%. Weight change:   Physical Exam: General: Well developed, well nourished, in no acute distress.  Head: Normocephalic, Bruise to face and neck  Neck: Negative for carotid bruits. JVP not elevated.  Lungs: Clear bilaterally to auscultation without wheezes, rales, or rhonchi. Breathing is unlabored.  Heart: IRR tachy with S1 S2. No murmurs, rubs, or gallops appreciated.  Abdomen: Soft, non-tender,  non-distended with normoactive bowel sounds. No hepatomegaly. No rebound/guarding. No obvious abdominal masses.  Msk: Strength and tone appears normal for age.  Extremities: No clubbing, cyanosis or edema. Distal pedal pulses are 2+ and equal bilaterally.  Neuro: awake and alert.    Telemetry: AF with RVR 150s  Lab Results: Basic Metabolic Panel:  Lab 02/04/12 1610 02/04/12 0600 02/03/12 1819 02/03/12 1143  NA 140 139 -- 140  K 3.9 2.7* -- --  CL 102 101 -- 99  CO2 26 28 -- 22  GLUCOSE 118* 105* -- 111*  BUN 5* 4* -- 8  CREATININE 0.71 0.78 0.57 0.81  CALCIUM 9.3 8.7 -- 9.0  MG -- -- -- --  PHOS -- -- -- --   Liver Function Tests:  Lab 02/03/12 1143  AST 18  ALT 16  ALKPHOS 75  BILITOT 0.5  PROT 6.7  ALBUMIN 3.4*   No results found for this basename: LIPASE:5,AMYLASE:5 in the last 168 hours  Lab 02/03/12 1819  AMMONIA 46   CBC:  Lab 02/04/12 0600 02/03/12 1819 02/03/12 1143  WBC 8.8 9.5 13.5*  NEUTROABS -- -- 8.0*  HGB 15.0 14.6 15.3  HCT 44.6 44.8 46.7  MCV 103.7* 101.6* 106.4*  PLT  240 255 277   Cardiac Enzymes:  Lab 02/04/12 0606 02/04/12 0022 02/03/12 1819 02/03/12 1143  CKTOTAL -- -- -- --  CKMB -- -- -- --  CKMBINDEX -- -- -- --  TROPONINI <0.30 <0.30 <0.30 <0.30   BNP: No components found with this basename: POCBNP:5 CBG:  Lab 02/05/12 0809 02/04/12 2142 02/04/12 1720 02/04/12 1227 02/04/12 0804  GLUCAP 105* 194* 125* 119* 103*   Microbiology: Lab Results  Component Value Date   CULT NO GROWTH 5 DAYS 07/07/2010   CULT NO GROWTH 5 DAYS 07/07/2010   CULT NO GROWTH 07/06/2010   CULT NO GROWTH 2 DAYS 07/15/2009   CULT NO GROWTH 5 DAYS 07/15/2009   No results found for this basename: CULT:2,SDES:2 in the last 168 hours  Imaging: Dg Lumbar Spine Complete  02/03/2012  *RADIOLOGY REPORT*  Clinical Data: Fall.  Low back pain.  LUMBAR SPINE - COMPLETE 4+ VIEW  Comparison: 11/12/2011, MRI 02/18/2011  Findings: Grade 1 slip L5 on S1 is stable from  prior studies. There are bilateral pars defects of L5.  Remaining lumbar alignment is normal.  No acute fracture.  Mild disc degeneration and anterior spurring L2-3 and L3-4.  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  No acute fracture.   Original Report Authenticated By: Janeece Riggers, M.D.    Ct Head Wo Contrast  02/04/2012  *RADIOLOGY REPORT*  Clinical Data: Altered mental status, possible seizure  CT HEAD WITHOUT CONTRAST  Technique:  Contiguous axial images were obtained from the base of the skull through the vertex without contrast.  Comparison: 02/03/2012  Findings: Motion degraded images.  No evidence of parenchymal hemorrhage or extra-axial fluid collection. No mass lesion, mass effect, or midline shift.  No CT evidence of acute infarction.  Subcortical white matter and periventricular small vessel ischemic changes.  Intracranial atherosclerosis.  Cerebral volume is age appropriate.  No ventriculomegaly.  The visualized paranasal sinuses are essentially clear. The mastoid air cells are unopacified.  No evidence of calvarial fracture.  IMPRESSION: Motion degraded images.  No interval change from recent CT.   Original Report Authenticated By: Charline Bills, M.D.    Ct Head Wo Contrast  02/03/2012  *RADIOLOGY REPORT*  Clinical Data:  Found down.  Weakness and confusion.  CT HEAD WITHOUT CONTRAST CT CERVICAL SPINE WITHOUT CONTRAST  Technique:  Multidetector CT imaging of the head and cervical spine was performed following the standard protocol without intravenous contrast.  Multiplanar CT image reconstructions of the cervical spine were also generated.  Comparison:  11/22/2011.  CT HEAD  Findings: The ventricles are in the midline without mass effect or shift.  There are stable in size and configuration.  There is advanced periventricular white matter disease for age.  No extra- axial fluid collections identified.  No CT findings for acute hemispheric infarction and/or  intracranial hemorrhage.  No mass lesions.  The brainstem and cerebellum grossly normal and stable.  The bony structures are intact.  The paranasal sinuses and mastoid air cells are clear.  IMPRESSION:  1.  Stable age advanced periventricular white matter disease. 2.  No acute intracranial findings or mass lesions.  CT CERVICAL SPINE  Findings: Examination is degraded by motion artifact but the overall alignment of the cervical vertebral bodies is normal and no acute fractures identified.  There is a remote ununited spinous process fracture of T1.  IMPRESSION: Normal alignment and no acute bony findings.   Original Report Authenticated By: Rudie Meyer, M.D.  Ct Cervical Spine Wo Contrast  02/03/2012  *RADIOLOGY REPORT*  Clinical Data:  Found down.  Weakness and confusion.  CT HEAD WITHOUT CONTRAST CT CERVICAL SPINE WITHOUT CONTRAST  Technique:  Multidetector CT imaging of the head and cervical spine was performed following the standard protocol without intravenous contrast.  Multiplanar CT image reconstructions of the cervical spine were also generated.  Comparison:  11/22/2011.  CT HEAD  Findings: The ventricles are in the midline without mass effect or shift.  There are stable in size and configuration.  There is advanced periventricular white matter disease for age.  No extra- axial fluid collections identified.  No CT findings for acute hemispheric infarction and/or intracranial hemorrhage.  No mass lesions.  The brainstem and cerebellum grossly normal and stable.  The bony structures are intact.  The paranasal sinuses and mastoid air cells are clear.  IMPRESSION:  1.  Stable age advanced periventricular white matter disease. 2.  No acute intracranial findings or mass lesions.  CT CERVICAL SPINE  Findings: Examination is degraded by motion artifact but the overall alignment of the cervical vertebral bodies is normal and no acute fractures identified.  There is a remote ununited spinous process fracture of  T1.  IMPRESSION: Normal alignment and no acute bony findings.   Original Report Authenticated By: Rudie Meyer, M.D.    Mr Brain Wo Contrast  02/04/2012  *RADIOLOGY REPORT*  Clinical Data: Seizure.  Dizziness.  Possible loss of consciousness.  Fall from wheelchair of trauma head.  MRI HEAD WITHOUT CONTRAST  Technique:  Multiplanar, multiecho pulse sequences of the brain and surrounding structures were obtained according to standard protocol without intravenous contrast.  Comparison: CT head without contrast 02/04/2012.  MRI brain 07/07/2010.  Findings: Small subarachnoid or subdural anterior frontal hematomas are evident bilaterally.  There is no significant mass effect. Atrophy and extensive white matter disease is otherwise stable.  No acute infarct, parenchymal hemorrhage, or mass lesion is present.  Flow is present in the major intracranial arteries.  The patient is status post left lens extraction.  The paranasal sinuses and mastoid air cells are clear.  IMPRESSION:  1.  Tiny bilateral frontal subarachnoid or subdural hemorrhages without significant underlying trauma. 2.  Moderate atrophy white matter disease is stable.  Critical Value/emergent results were called by telephone at the time of interpretation on 02/04/2012 at 08:40 p.m. to [facility], R.N., who verbally acknowledged these results.   Original Report Authenticated By: Marin Roberts, M.D.    Dg Pelvis Portable  02/03/2012  *RADIOLOGY REPORT*  Clinical Data: Larey Seat.  PORTABLE PELVIS  Comparison: None  Findings: The hips are normally located.  No acute hip fracture. The pubic symphysis and SI joints are intact.  No pelvic fractures.  IMPRESSION: No acute bony findings.   Original Report Authenticated By: Rudie Meyer, M.D.    Dg Chest Port 1 View  02/03/2012  *RADIOLOGY REPORT*  Clinical Data: Found down.  Confused.  PORTABLE CHEST - 1 VIEW  Comparison: 11/12/2011.  Findings: The cardiac silhouette, mediastinal and hilar contours are  stable given the supine position of the patient.  The lungs are grossly clear.  The bony thorax is intact.  Remote healed rib fractures are noted.  IMPRESSION: No acute cardiopulmonary findings and intact bony thorax.   Original Report Authenticated By: Rudie Meyer, M.D.      ASSESSMENT:   1. 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 Traumatic SDH/SAH 3. CAD  4. Seizures  5. DM2  PLAN/DISCUSSION:  He is  back in AF with RVR. Will give additional amio bolus and increase drip. Not candidate for anti-coagulation due to SAH/SDH and ETOH. Await echo. CAD appears stable.    LOS: 2 days    Patrick Meres, MD 02/05/2012, 11:04 AM

## 2012-02-05 NOTE — Progress Notes (Signed)
Dr. Shaune Spittle made aware pt's HR continues to spike to the 160's and is in the 140's most of the time.  Amiodarone left at 60mg /hr and pt bolused wth amiodarone 150mg  .  RN continuing to monitor.

## 2012-02-05 NOTE — Progress Notes (Signed)
PT Cancellation Note  Patient Details Name: Patrick Holt MRN: [id number] DOB: [birth year]   Cancelled Treatment:    Reason Eval/Treat Not Completed: Medical issues which prohibited therapy.  Patient with orders for "strict bedrest".  MD note indicates "PT/OT eval when clinically improved".  *MD - Please write activity orders when appropriate for patient.  Thank you.   Vena Austria 02/05/2012, 10:03 AM [phone]

## 2012-02-05 NOTE — Progress Notes (Signed)
Patient ID: Patrick Holt, male   DOB: [birth year], 61 y.o.   MRN: [id number] [facility] Medicine Teaching Service PGY-1 Progress Note   Overnight Events: Patient reports he does not recall the event that led up to his admission. He reports he has had seizures in the past, and heard he had a seizure last night. He would like to get up and walk and does not seem to have great insight to the reason he has been admitted or his cardiac condition. Complains of aches and pains from laying in bed.  Objective: BP 154/80  Pulse 62  Temp 97.6 F (36.4 C) (Oral)  Resp 11  Ht 5\' 9"  (1.753 m)  Wt 232 lb 9.4 oz (105.5 kg)  BMI 34.35 kg/m2  SpO2 98%  Physical Exam:  General: NAD. Irritated.  Head: Normocephalic, bruised right side of face.  Neck: No JVD noted. Lungs: CTAB,.  Heart: RRR. No mur mur. Abdomen: Soft, non-tender, non-distended with normoactive bowel sounds. No hepatomegaly. No rebound/guarding. No obvious abdominal masses.  Extremities: NT. No erythema. No edema. Neuro: awake and alert.   Assessment and Plan: GRAYDEN BURLEY is a 61 y.o. year old male with h/o RA, Depression, HTN, diverticulosis/itis, HLD, COPD, Reflux, Seizures, Fibromyalgia, Dermatomyositis, MIO, raynaud's, hiatal hernia, neprholithiasis, esophageal dysmotility, paroxysmal A-fib, and otosclerosis presenting w/ fall and likely seizure.   Fall, with witnessed tonic clonic seizure- Pt presented with fall. CT scan negative in ED. Multiple labs done on admission and loaded with dilantin and one time dose of Ativan. Cardiac source of fall possible as q/ h/o paroxysmal A-fib and w/ h/o incontinence, but unlikely.  - Telemetry, In SDU - Neuro consulted due to possible ongoing seizure, greatly appreciate recs.  - Repeat CT did not show acute changes.  - TSH, B12 WNL.  -  Keppra 500 BID. - MRI: Subarachnoid vs small subdural bleed; Neuro checks Q4, if patients becomes symptomatic page neuro surgery. - Seizure precautions,  aspiration precautions, and bedrest. - If becomes combative, will need restraints and possible Haldol  - For possible alcohol withdrawal, CIWA protocol. Oral B1 and Folate.  - PT/OT when more stable.   Hx of Psych disorder - Pt w/ no formal diagnoses but w/ apparent Psych history. Pt seen by Saul Fordyce, NP [facility]). Called practice to obtain information about diagnosis and treatment and was informed that pt had been fired from [employer]. Pt w/ significant tangential thinking and likely confabulation. May be combination of alcohol abuse/psychiatry disorder - Monitor for acute psychosis . W/ Haldol prn (QTc 0.448, would give up to QTc 0.50) -F/u w/ psych office today  - Inpt psych to evaluate once patient is more cooperative, greatly appreciate   DM II , controlled. A1C from 12/19 at 5.5  - SSI sensitive. No basal insulin - CBG with meals and QHS, if elevated, increase SSI   Hypokalemia - Pt found to have K+ of 2.7 on 12/20 Am  - Will replenish 40 meq KDur PO HTN - BP on admission 135/84  -Will continue home meds including Metoprolol and Zestoretic  Dermatomyositis - Stable, no clinical evidence  - Continue home Prednisone 5 mg qd Rheumatoid Arthritis - Stable  -Continue home MTX  Ischemic Cardiomyopathy, hx of MI x 2- Unsure of baseline. EKG w/o evidenc of MI at this time. Complained of palpitations and had evidence of bradycardia and tachycardia on telemetry last night. See's Dr. Daleen Squibb at [facility].  - cards consulted - Continue ASA and  BB - Strict I/O and daily weights  Skin wound with possible cellulitis - Pt initially had non purulent, non draining, non erythematous - Wound care: Dressed with Gel dressing and has signed off, available if needed - BCx x 2 pending.  - Started on IV doxycycline. Tobacco Abuse - Smokes about 1/2 ppd for unstated length  - Start Nicotine patches for replacement (21)  Bowel and Bladder Incontinence with hx of IBS-  States it is chronic  - Will continue home meds including Oxybutynin, Lomotil, Mag Ox, Bentyl  Hx of COPD - No evidence of exacerbation.  -Continue Combivent -O2 PRN to keep O2 > 92%  -FEN/GI:  -Dysphagia Three Diet  -Zofran PRN for nausea.  Prophylaxis: Heparin SQ Disposition: Telemetry pending further w/u of AMS Code Status: Unable to obtain. Will need to discuss if has capacity.

## 2012-02-05 NOTE — Progress Notes (Signed)
FMTS Attending Daily Note:  Renold Don MD  [phone] pager  Family Practice pager:  [phone] I have discussed this patient with the resident [facility].  I agree with their findings, assessment, and care plan.  Addionally: I know Mr. Tenaglia VERY well as he was my patient at [facility].  It is often very difficult to get a straight story from him due to his circumferential thinking and perseverations.  Greatly appreciate Neurology, Cardiology, and Psychiatry input into his care.   - Agree with close following for subdural/subarachnoid bleed.  Pulse and respirations good thus far, with tachy-brady course noted.  Watch for any signs of increased intracranial pressure.  - It is difficult to ascertain Mr. Kundert's cognitive and functional baseline.  This is even true in the outpatient setting.  He has presented to clinic ambulating on his own to using a walker to using a motorized wheelchair depending on the day, sometimes with visits even only a few days apart.  He can drive on his own and lives by himself.     - I have changed his orders to Up with Assistance so that we can have PT evaluate him for strength needs.

## 2012-02-05 NOTE — Consult Note (Signed)
Reason for Consult: "Altered Mental status" Referring Physician: Dr. Lavell Luster is an 61 y.o. male.  HPI: Patient was seen and chart reviewed. Patient was admitted to [facility] medical floor with altered mental status, atrial fib and active seizure and history of falls. Patient has history of depression and received out patient psychiatric services from [facility] in [place]. Reportedly he was treated with Cymbalta and benzodiazepines. Patient denied history of bipolar disorder and psychosis. He has denied substance abuse but has regular limited drinking at dinner.   MSE: Patient was on his bed with cardiac monitor and his staff RN was at bed side and planning to give his IM benzo as ordered because he is feeling anxious. He agree to be interviewed for psychiatric evaluation and able to participate in a calm and cooperative. His evaluation was terminated when he expressed increased anxiety and chest discomfort and his Staff RN provided planned shot which helped him relax. He has anxious mood, and congruent affect. He has normal speech and thought process. He has denied suicidal or homicidal ideations. He has no evidence of psyhosis.   Past Medical History  Diagnosis Date  . Rheumatoid arthritis   . Obesity   . Major depression     with acute psychotic break in 06/2010  . Diabetes mellitus   . Hypertension   . Hyperlipidemia   . Diverticulosis of colon   . COPD (chronic obstructive pulmonary disease)   . Anxiety   . GERD (gastroesophageal reflux disease)   . Vertigo   . Seizures   . Fibromyalgia   . Dermatomyositis   . Myocardial infarct     mulitple (1999, 2000, 2004)  . Raynaud's disease   . Narcotic dependence   . Peripheral neuropathy   . Internal hemorrhoids   . Ischemic heart disease   . Hiatal hernia   . Gastritis   . Diverticulitis   . Hx of adenomatous colonic polyps   . Nephrolithiasis   . Anemia   . Esophageal stricture   .  Esophageal dysmotility   . Dermatomyositis   . Paroxysmal a-fib   . Urge incontinence   . Otosclerosis     Past Surgical History  Procedure Date  . Knee arthroscopy w/ meniscal repair 2009    left  . Lumbar disc surgery   . Squamous papilloma  2010    removed by Dr. Pollyann Kennedy ENT, noted on tongue    Family History  Problem Relation Age of Onset  . Alcohol abuse Mother   . Depression Mother   . Heart disease Mother   . Diabetes Mother   . Stroke Mother   . Diabetes Other     1/2 brother    Social History:  reports that he has been smoking.  He does not have any smokeless tobacco history on file. He reports that he drinks about .6 ounces of alcohol per week. He reports that he does not use illicit drugs.  Allergies:  Allergies  Allergen Reactions  . Betamethasone Dipropionate     Unknown  . Bupropion Hcl     Unknown  . Ciprofloxacin     REACTION: swelling  . Clobetasol     Unknown  . Codeine     Unknown  . Escitalopram Oxalate     Unknown  . Fluoxetine Hcl     Unknown  . Fluticasone-Salmeterol     Unknown  . Furosemide     Unknown  . Immune Globulins  Acute renal failure  . Metronidazole     REACTION: swelling  . Paroxetine     Unknown  . Penicillins     Unknown  . Statins     Unknown  . Tacrolimus     Unknown  . Tetanus Toxoid     Unknown  . Tuberculin Purified Protein Derivative     Unknown    Medications: I have reviewed the patient's current medications.  Results for orders placed during the hospital encounter of 02/03/12 (from the past 48 hour(s))  AMMONIA     Status: Normal   Collection Time   02/03/12  6:19 PM      Component Value Range Comment   Ammonia 46  11 - 60 umol/L   CBC     Status: Abnormal   Collection Time   02/03/12  6:19 PM      Component Value Range Comment   WBC 9.5  4.0 - 10.5 K/uL    RBC 4.41  4.22 - 5.81 MIL/uL    Hemoglobin 14.6  13.0 - 17.0 g/dL    HCT 16.1  09.6 - 04.5 %    MCV 101.6 (*) 78.0 - 100.0 fL     MCH 33.1  26.0 - 34.0 pg    MCHC 32.6  30.0 - 36.0 g/dL    RDW 40.9  81.1 - 91.4 %    Platelets 255  150 - 400 K/uL   CREATININE, SERUM     Status: Normal   Collection Time   02/03/12  6:19 PM      Component Value Range Comment   Creatinine, Ser 0.57  0.50 - 1.35 mg/dL    GFR calc non Af Amer >90  >90 mL/min    GFR calc Af Amer >90  >90 mL/min   TROPONIN I     Status: Normal   Collection Time   02/03/12  6:19 PM      Component Value Range Comment   Troponin I <0.30  <0.30 ng/mL   SALICYLATE LEVEL     Status: Abnormal   Collection Time   02/03/12  6:19 PM      Component Value Range Comment   Salicylate Lvl <2.0 (*) 2.8 - 20.0 mg/dL   ACETAMINOPHEN LEVEL     Status: Normal   Collection Time   02/03/12  6:19 PM      Component Value Range Comment   Acetaminophen (Tylenol), Serum <15.0  10 - 30 ug/mL   HEMOGLOBIN A1C     Status: Normal   Collection Time   02/03/12  6:19 PM      Component Value Range Comment   Hemoglobin A1C 5.5  <5.7 %    Mean Plasma Glucose 111  <117 mg/dL   GLUCOSE, CAPILLARY     Status: Abnormal   Collection Time   02/04/12 12:20 AM      Component Value Range Comment   Glucose-Capillary 120 (*) 70 - 99 mg/dL   TROPONIN I     Status: Normal   Collection Time   02/04/12 12:22 AM      Component Value Range Comment   Troponin I <0.30  <0.30 ng/mL   TSH     Status: Normal   Collection Time   02/04/12 12:43 AM      Component Value Range Comment   TSH 0.398  0.350 - 4.500 uIU/mL   VITAMIN B12     Status: Normal   Collection Time   02/04/12 12:43 AM  Component Value Range Comment   Vitamin B-12 326  211 - 911 pg/mL   LACTIC ACID, PLASMA     Status: Normal   Collection Time   02/04/12 12:44 AM      Component Value Range Comment   Lactic Acid, Venous 1.5  0.5 - 2.2 mmol/L   MRSA PCR SCREENING     Status: Normal   Collection Time   02/04/12  1:28 AM      Component Value Range Comment   MRSA by PCR NEGATIVE  NEGATIVE   POCT I-STAT 3, BLOOD GAS  (G3+)     Status: Abnormal   Collection Time   02/04/12  1:28 AM      Component Value Range Comment   pH, Arterial 7.477 (*) 7.350 - 7.450    pCO2 arterial 40.4  35.0 - 45.0 mmHg    pO2, Arterial 34.0 (*) 80.0 - 100.0 mmHg    Bicarbonate 29.9 (*) 20.0 - 24.0 mEq/L    TCO2 31  0 - 100 mmol/L    O2 Saturation 70.0      Acid-Base Excess 6.0 (*) 0.0 - 2.0 mmol/L    Sample type ARTERIAL      Comment MD NOTIFIED, REPEAT TEST     POCT I-STAT 3, BLOOD GAS (G3+)     Status: Abnormal   Collection Time   02/04/12  1:42 AM      Component Value Range Comment   pH, Arterial 7.473 (*) 7.350 - 7.450    pCO2 arterial 40.0  35.0 - 45.0 mmHg    pO2, Arterial 70.0 (*) 80.0 - 100.0 mmHg    Bicarbonate 29.3 (*) 20.0 - 24.0 mEq/L    TCO2 31  0 - 100 mmol/L    O2 Saturation 95.0      Acid-Base Excess 5.0 (*) 0.0 - 2.0 mmol/L    Sample type ARTERIAL     GLUCOSE, CAPILLARY     Status: Abnormal   Collection Time   02/04/12  4:32 AM      Component Value Range Comment   Glucose-Capillary 113 (*) 70 - 99 mg/dL    Comment 1 Documented in Chart     BASIC METABOLIC PANEL     Status: Abnormal   Collection Time   02/04/12  6:00 AM      Component Value Range Comment   Sodium 139  135 - 145 mEq/L    Potassium 2.7 (*) 3.5 - 5.1 mEq/L    Chloride 101  96 - 112 mEq/L    CO2 28  19 - 32 mEq/L    Glucose, Bld 105 (*) 70 - 99 mg/dL    BUN 4 (*) 6 - 23 mg/dL    Creatinine, Ser 4.09  0.50 - 1.35 mg/dL    Calcium 8.7  8.4 - 81.1 mg/dL    GFR calc non Af Amer >90  >90 mL/min    GFR calc Af Amer >90  >90 mL/min   CBC     Status: Abnormal   Collection Time   02/04/12  6:00 AM      Component Value Range Comment   WBC 8.8  4.0 - 10.5 K/uL    RBC 4.30  4.22 - 5.81 MIL/uL    Hemoglobin 15.0  13.0 - 17.0 g/dL    HCT 91.4  78.2 - 95.6 %    MCV 103.7 (*) 78.0 - 100.0 fL    MCH 34.9 (*) 26.0 - 34.0 pg    MCHC 33.6  30.0 - 36.0 g/dL    RDW 40.9  81.1 - 91.4 %    Platelets 240  150 - 400 K/uL   TROPONIN I     Status:  Normal   Collection Time   02/04/12  6:06 AM      Component Value Range Comment   Troponin I <0.30  <0.30 ng/mL   GLUCOSE, CAPILLARY     Status: Abnormal   Collection Time   02/04/12  8:04 AM      Component Value Range Comment   Glucose-Capillary 103 (*) 70 - 99 mg/dL   GLUCOSE, CAPILLARY     Status: Abnormal   Collection Time   02/04/12 12:27 PM      Component Value Range Comment   Glucose-Capillary 119 (*) 70 - 99 mg/dL    Comment 1 Notify RN     BASIC METABOLIC PANEL     Status: Abnormal   Collection Time   02/04/12  4:32 PM      Component Value Range Comment   Sodium 140  135 - 145 mEq/L    Potassium 3.9  3.5 - 5.1 mEq/L    Chloride 102  96 - 112 mEq/L    CO2 26  19 - 32 mEq/L    Glucose, Bld 118 (*) 70 - 99 mg/dL    BUN 5 (*) 6 - 23 mg/dL    Creatinine, Ser 7.82  0.50 - 1.35 mg/dL    Calcium 9.3  8.4 - 95.6 mg/dL    GFR calc non Af Amer >90  >90 mL/min    GFR calc Af Amer >90  >90 mL/min   RPR     Status: Normal   Collection Time   02/04/12  4:32 PM      Component Value Range Comment   RPR NON REACTIVE  NON REACTIVE   GLUCOSE, CAPILLARY     Status: Abnormal   Collection Time   02/04/12  5:20 PM      Component Value Range Comment   Glucose-Capillary 125 (*) 70 - 99 mg/dL   GLUCOSE, CAPILLARY     Status: Abnormal   Collection Time   02/04/12  9:42 PM      Component Value Range Comment   Glucose-Capillary 194 (*) 70 - 99 mg/dL    Comment 1 Notify RN     GLUCOSE, CAPILLARY     Status: Abnormal   Collection Time   02/05/12  8:09 AM      Component Value Range Comment   Glucose-Capillary 105 (*) 70 - 99 mg/dL   GLUCOSE, CAPILLARY     Status: Abnormal   Collection Time   02/05/12 11:36 AM      Component Value Range Comment   Glucose-Capillary 183 (*) 70 - 99 mg/dL    Comment 1 Notify RN       Ct Head Wo Contrast  02/04/2012  *RADIOLOGY REPORT*  Clinical Data: Altered mental status, possible seizure  CT HEAD WITHOUT CONTRAST  Technique:  Contiguous axial  images were obtained from the base of the skull through the vertex without contrast.  Comparison: 02/03/2012  Findings: Motion degraded images.  No evidence of parenchymal hemorrhage or extra-axial fluid collection. No mass lesion, mass effect, or midline shift.  No CT evidence of acute infarction.  Subcortical white matter and periventricular small vessel ischemic changes.  Intracranial atherosclerosis.  Cerebral volume is age appropriate.  No ventriculomegaly.  The visualized paranasal sinuses are essentially clear. The mastoid air cells are unopacified.  No evidence of calvarial fracture.  IMPRESSION: Motion degraded images.  No interval change from recent CT.   Original Report Authenticated By: Charline Bills, M.D.    Mr Brain Wo Contrast  02/04/2012  *RADIOLOGY REPORT*  Clinical Data: Seizure.  Dizziness.  Possible loss of consciousness.  Fall from wheelchair of trauma head.  MRI HEAD WITHOUT CONTRAST  Technique:  Multiplanar, multiecho pulse sequences of the brain and surrounding structures were obtained according to standard protocol without intravenous contrast.  Comparison: CT head without contrast 02/04/2012.  MRI brain 07/07/2010.  Findings: Small subarachnoid or subdural anterior frontal hematomas are evident bilaterally.  There is no significant mass effect. Atrophy and extensive white matter disease is otherwise stable.  No acute infarct, parenchymal hemorrhage, or mass lesion is present.  Flow is present in the major intracranial arteries.  The patient is status post left lens extraction.  The paranasal sinuses and mastoid air cells are clear.  IMPRESSION:  1.  Tiny bilateral frontal subarachnoid or subdural hemorrhages without significant underlying trauma. 2.  Moderate atrophy white matter disease is stable.  Critical Value/emergent results were called by telephone at the time of interpretation on 02/04/2012 at 08:40 p.m. to [facility], R.N., who verbally acknowledged these results.    Original Report Authenticated By: Marin Roberts, M.D.     Positive for anxiety, bad mood, depression, sleep disturbance and tobacco use Blood pressure 154/80, pulse 62, temperature 97.6 F (36.4 C), temperature source Oral, resp. rate 11, height 5\' 9"  (1.753 m), weight 232 lb 9.4 oz (105.5 kg), SpO2 98.00%.   Assessment/Plan: Major depressive disorder, recurrent Altered mental status - improved  Patient does not meet criteria for acute psychiatric hospitalization. He will be following up with out patient psychiatric services upon discharge from hospital. Social service may provide several referral in local community if needed. He may restart his medications Cymbalta, wellbutrin and xanax upon medically stable. Appreciate psych consult and will sign off. May contact if needs further assistance.  Treniyah Lynn,JANARDHAHA R. 02/05/2012, 2:52 PM

## 2012-02-05 NOTE — Progress Notes (Signed)
Radiology called, spoke to Dr. Alfredo Batty with the result of MRI-tiny bil. frontal subarachnoid or subdural hemorrhage. Will call the primary MD to follow.

## 2012-02-05 NOTE — Progress Notes (Signed)
Pts. Rhythm in the monitor converted to sinus HR-60's-70's,  BP stable Dr. Terressa Koyanagi made aware.

## 2012-02-05 NOTE — Progress Notes (Signed)
Pt. Had episode of HR up to the 130's-150' after using the bathroom, back to bed pt. Stated feeling of palpitation. O2 administered at 2l, 12 lead EKG done BP stable. EKG shows afib. Thayer Ohm BergNP made aware and with orders made. Dr. Tenny Craw was also notified and in to see pt. with orders made. Cont.to have HR of 120's-150's and down to 90'-110's, very irregular and on and off with premature beats. Amiodarone drip started. Will cont. monitor pt.

## 2012-02-06 DIAGNOSIS — F339 Major depressive disorder, recurrent, unspecified: Secondary | ICD-10-CM

## 2012-02-06 DIAGNOSIS — I359 Nonrheumatic aortic valve disorder, unspecified: Secondary | ICD-10-CM

## 2012-02-06 LAB — BASIC METABOLIC PANEL
Calcium: 9.5 mg/dL (ref 8.4–10.5)
GFR calc non Af Amer: 90 mL/min — ABNORMAL LOW (ref >90)
Glucose, Bld: 123 mg/dL — ABNORMAL HIGH (ref 70–99)
Sodium: 137 mEq/L (ref 135–145)

## 2012-02-06 LAB — CBC
Hemoglobin: 17.1 g/dL — ABNORMAL HIGH (ref 13.0–17.0)
MCH: 35.9 pg — ABNORMAL HIGH (ref 26.0–34.0)
MCHC: 34.5 g/dL (ref 30.0–36.0)

## 2012-02-06 MED ORDER — AMIODARONE LOAD VIA INFUSION: 150.0000 mg | Freq: Once | INTRAVENOUS | Status: DC

## 2012-02-06 MED ORDER — METOPROLOL TARTRATE 1 MG/ML IV SOLN
INTRAVENOUS | Status: AC
  Filled 2012-02-06: qty 5

## 2012-02-06 MED ORDER — METOPROLOL TARTRATE 1 MG/ML IV SOLN
5.0000 mg | Freq: Once | INTRAVENOUS | Status: AC
  Administered 2012-02-06: 5 mg via INTRAVENOUS

## 2012-02-06 NOTE — Progress Notes (Signed)
FMTS Attending Daily Note:  Renold Don MD  [phone] pager  Family Practice pager:  [phone] I have seen and examined this patient and have reviewed their chart. I have discussed this patient with the resident. I agree with the resident's findings, assessment and care plan.  Patient only complaints are not having his pants.  HR much improved with Amiodarone.  Greatly appreciate consultants' help.  Agree with transition to po tomorrow.   He is back to his cognitive baseline.  He actually looks better today than when I usually see him in clinic. No nystagmus on examination.  No focal deficits on my exam.  No resp depression/tachycardia -- less likely that subdural has extended.

## 2012-02-06 NOTE — Progress Notes (Signed)
Subjective:   61 y/o with CAD, ETOH abuse. Admitted with seizures and SAH/SDH after fall.   Maintaining SR on amio. No significant bradycardia.   Tele: SR 60-70s    Intake/Output Summary (Last 24 hours) at 02/06/12 0933 Last data filed at 02/06/12 0700  Gross per 24 hour  Intake 1773.5 ml  Output   2700 ml  Net -926.5 ml    Current meds:    . amiodarone  150 mg Intravenous Once  . aspirin EC  81 mg Oral Daily  . collagenase   Topical Daily  . doxycycline  100 mg Oral Q12H  . folic acid  1 mg Oral Daily  . lisinopril  10 mg Oral Daily   And  . hydrochlorothiazide  12.5 mg Oral Daily  . insulin aspart  0-9 Units Subcutaneous TID WC  . levETIRAcetam  500 mg Oral BID  . magnesium oxide  400 mg Oral Daily  . methotrexate  7.5 mg Oral Weekly  . niacin  1,000 mg Oral QHS  . nicotine  21 mg Transdermal Daily  . oxybutynin  5 mg Oral TID  . potassium chloride SA  20 mEq Oral Daily  . predniSONE  5 mg Oral QAC breakfast  . sodium chloride  3 mL Intravenous Q12H  . thiamine  100 mg Oral Daily  . torsemide  10 mg Oral Daily   Infusions:    . amiodarone (NEXTERONE PREMIX) 360 mg/200 mL dextrose 60 mg/hr (02/06/12 0517)     Objective:  Blood pressure 143/98, pulse 75, temperature 98.6 F (37 C), temperature source Oral, resp. rate 18, height 5\' 9"  (1.753 m), weight 105.5 kg (232 lb 9.4 oz), SpO2 95.00%. Weight change:   Physical Exam: General: Well developed, well nourished, in no acute distress.  Head: Normocephalic, Bruise to face and neck  Neck: Negative for carotid bruits. JVP not elevated.  Lungs: Clear bilaterally to auscultation without wheezes, rales, or rhonchi. Breathing is unlabored.  Heart: RRR. No murmurs, rubs, or gallops appreciated.  Abdomen: Soft, non-tender, non-distended with normoactive bowel sounds. No hepatomegaly. No rebound/guarding. No obvious abdominal masses.  Msk: Strength and tone appears normal for age.  Extremities: No clubbing,  cyanosis or edema. Distal pedal pulses are 2+ and equal bilaterally.  Neuro: awake and alert.    Telemetry: AF with RVR 150s  Lab Results: Basic Metabolic Panel:  Lab 02/04/12 4098 02/04/12 0600 02/03/12 1819 02/03/12 1143  NA 140 139 -- 140  K 3.9 2.7* -- --  CL 102 101 -- 99  CO2 26 28 -- 22  GLUCOSE 118* 105* -- 111*  BUN 5* 4* -- 8  CREATININE 0.71 0.78 0.57 0.81  CALCIUM 9.3 8.7 -- 9.0  MG -- -- -- --  PHOS -- -- -- --   Liver Function Tests:  Lab 02/03/12 1143  AST 18  ALT 16  ALKPHOS 75  BILITOT 0.5  PROT 6.7  ALBUMIN 3.4*   No results found for this basename: LIPASE:5,AMYLASE:5 in the last 168 hours  Lab 02/03/12 1819  AMMONIA 46   CBC:  Lab 02/04/12 0600 02/03/12 1819 02/03/12 1143  WBC 8.8 9.5 13.5*  NEUTROABS -- -- 8.0*  HGB 15.0 14.6 15.3  HCT 44.6 44.8 46.7  MCV 103.7* 101.6* 106.4*  PLT 240 255 277   Cardiac Enzymes:  Lab 02/04/12 0606 02/04/12 0022 02/03/12 1819 02/03/12 1143  CKTOTAL -- -- -- --  CKMB -- -- -- --  CKMBINDEX -- -- -- --  TROPONINI <  0.30 <0.30 <0.30 <0.30   BNP: No components found with this basename: POCBNP:5 CBG:  Lab 02/06/12 0818 02/05/12 2215 02/05/12 1644 02/05/12 1136 02/05/12 0809  GLUCAP 118* 95 127* 183* 105*   Microbiology: Lab Results  Component Value Date   CULT        BLOOD CULTURE RECEIVED NO GROWTH TO DATE CULTURE WILL BE HELD FOR 5 DAYS BEFORE ISSUING A FINAL NEGATIVE REPORT 02/04/2012   CULT        BLOOD CULTURE RECEIVED NO GROWTH TO DATE CULTURE WILL BE HELD FOR 5 DAYS BEFORE ISSUING A FINAL NEGATIVE REPORT 02/04/2012   CULT NO GROWTH 5 DAYS 07/07/2010   CULT NO GROWTH 5 DAYS 07/07/2010   CULT NO GROWTH 07/06/2010    Lab 02/04/12 0050 02/04/12 0045  CULT       BLOOD CULTURE RECEIVED NO GROWTH TO DATE CULTURE WILL BE HELD FOR 5 DAYS BEFORE ISSUING A FINAL NEGATIVE REPORT       BLOOD CULTURE RECEIVED NO GROWTH TO DATE CULTURE WILL BE HELD FOR 5 DAYS BEFORE ISSUING A FINAL NEGATIVE REPORT  SDES BLOOD  LEFT HAND BLOOD LEFT ARM    Imaging: Mr Brain Wo Contrast  02/04/2012  *RADIOLOGY REPORT*  Clinical Data: Seizure.  Dizziness.  Possible loss of consciousness.  Fall from wheelchair of trauma head.  MRI HEAD WITHOUT CONTRAST  Technique:  Multiplanar, multiecho pulse sequences of the brain and surrounding structures were obtained according to standard protocol without intravenous contrast.  Comparison: CT head without contrast 02/04/2012.  MRI brain 07/07/2010.  Findings: Small subarachnoid or subdural anterior frontal hematomas are evident bilaterally.  There is no significant mass effect. Atrophy and extensive white matter disease is otherwise stable.  No acute infarct, parenchymal hemorrhage, or mass lesion is present.  Flow is present in the major intracranial arteries.  The patient is status post left lens extraction.  The paranasal sinuses and mastoid air cells are clear.  IMPRESSION:  1.  Tiny bilateral frontal subarachnoid or subdural hemorrhages without significant underlying trauma. 2.  Moderate atrophy white matter disease is stable.  Critical Value/emergent results were called by telephone at the time of interpretation on 02/04/2012 at 08:40 p.m. to [facility], R.N., who verbally acknowledged these results.   Original Report Authenticated By: Marin Roberts, M.D.      ASSESSMENT:   1. 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 Traumatic SDH/SAH 3. CAD  4. Seizures  5. DM2  PLAN/DISCUSSION:  Maintaining SR on IV amio. Will continue IV today and switch to po in the am.    LOS: 3 days    Arvilla Meres, MD 02/06/2012, 9:33 AM

## 2012-02-06 NOTE — Progress Notes (Signed)
  Echocardiogram 2D Echocardiogram has been performed.  [place] 02/06/2012, 5:03 PM

## 2012-02-06 NOTE — Evaluation (Addendum)
Speech Language Pathology Evaluation Patient Details Name: Patrick Holt MRN: [id number] DOB: 09/17/1950 Today's Date: 02/06/2012 Time: 1191-4782 SLP Time Calculation (min): 42 min  Problem List:  Patient Active Problem List  Diagnosis  . DIABETES MELLITUS, TYPE II  . HYPERLIPIDEMIA  . OBESITY, UNSPECIFIED  . DEPRESSION, MAJOR  . ANXIETY, CHRONIC  . TOBACCO USER  . HYPERTENSION  . ISCHEMIC HEART DISEASE  . RAYNAUD'S DISEASE  . HEMORRHOIDS, INTERNAL  . COPD  . ESOPHAGEAL MOTILITY DISORDER  . GERD  . HIATAL HERNIA  . DIVERTICULOSIS OF COLON  . Dermatomyositis  . RHEUMATOID ARTHRITIS  . FIBROMYALGIA  . VERTIGO  . Generalized pain  . TREMOR, RIGHT HAND  . Urge incontinence  . Irritable bowel syndrome (IBS)  . History of seizure disorder  . Radicular low back pain  . Ear pain  . Dental attrition, excessive  . Fungal infection  . High risk social situation  . SDH (subdural hematoma)  . Atrial fibrillation  . Coronary atherosclerosis of native coronary artery   Past Medical History:  Past Medical History  Diagnosis Date  . Rheumatoid arthritis   . Obesity   . Major depression     with acute psychotic break in 06/2010  . Diabetes mellitus   . Hypertension   . Hyperlipidemia   . Diverticulosis of colon   . COPD (chronic obstructive pulmonary disease)   . Anxiety   . GERD (gastroesophageal reflux disease)   . Vertigo   . Seizures   . Fibromyalgia   . Dermatomyositis   . Myocardial infarct     mulitple (1999, 2000, 2004)  . Raynaud's disease   . Narcotic dependence   . Peripheral neuropathy   . Internal hemorrhoids   . Ischemic heart disease   . Hiatal hernia   . Gastritis   . Diverticulitis   . Hx of adenomatous colonic polyps   . Nephrolithiasis   . Anemia   . Esophageal stricture   . Esophageal dysmotility   . Dermatomyositis   . Paroxysmal a-fib   . Urge incontinence   . Otosclerosis    Past Surgical History:  Past Surgical History   Procedure Date  . Knee arthroscopy w/ meniscal repair 2009    left  . Lumbar disc surgery   . Squamous papilloma  2010    removed by Dr. Pollyann Kennedy ENT, noted on tongue   HPI:  61 y/o male admitted s/p fall with AMS and witnessed seizure.  Head CT negative.  Patient referred for SLE secondary to stroke protocol.    Assessment / Plan / Recommendation Clinical Impression  Patient seen for Cognitive Linguistic evaluation.   Moderate cognitive deficits in areas of problem solving, safety awareness, decision making and reasoning   Deficits judged to be baseline.  All further Speech Language Pathology Services  in area of cognition to be addressed at next level of care.  Recommend SNF placement.     SLP Assessment  All further Speech Lanaguage Pathology  needs can be addressed in the next venue of care (SNF with 24 hour care)    Follow Up Recommendations  Skilled Nursing facility           SLP Evaluation Prior Functioning  Cognitive/Linguistic Baseline: Baseline deficits Type of Home: House Lives With: Alone Available Help at Discharge: Available PRN/intermittently Education: 12 th grade education  Vocation: On disability   Cognition  Overall Cognitive Status: Impaired at baseline Arousal/Alertness: Awake/alert Orientation Level: Oriented X4 Attention: Focused Focused Attention: Impaired  Focused Attention Impairment: Verbal complex;Functional basic;Functional complex Memory: Appears intact Awareness: Impaired Awareness Impairment: Emergent impairment;Anticipatory impairment Problem Solving: Impaired Problem Solving Impairment: Verbal complex;Functional basic;Functional complex Executive Function: Reasoning;Decision Making Reasoning: Impaired Reasoning Impairment: Verbal complex;Functional basic;Functional complex Decision Making: Impaired Decision Making Impairment: Verbal basic;Verbal complex;Functional basic Behaviors: Perseveration;Lability Safety/Judgment: Impaired     Comprehension  Auditory Comprehension Overall Auditory Comprehension: Appears within functional limits for tasks assessed Yes/No Questions: Within Functional Limits Commands: Within Functional Limits Conversation: Simple Visual Recognition/Discrimination Discrimination: Not tested Reading Comprehension Reading Status: Not tested    Expression Expression Primary Mode of Expression: Verbal Verbal Expression Overall Verbal Expression: Appears within functional limits for tasks assessed   Oral / Motor Oral Motor/Sensory Function Overall Oral Motor/Sensory Function: Appears within functional limits for tasks assessed Motor Speech Overall Motor Speech: Appears within functional limits for tasks assessed   GO    Moreen Fowler MS,CCC-SLP [phone] [facility] 02/06/2012, 9:51 AM

## 2012-02-06 NOTE — Progress Notes (Signed)
Family Practice Teaching Service Interval Progress Note  Stopped by to see patient, who was getting his echo done. He reports anxiety about having lost his clothing during his various room transitions, because his keys to his house were in his pants pocket. Otherwise, says he is feeling all right. Head pain depends on which direction he lays his head. Denies chest pain.  Exam: Gen: NAD, awake, alert HEENT: EOMI Heart: regular rate and rhythm of R radial pulse, did not auscultate due to echo Lungs: normal respiratory effort Ext: R lower extremity with bandage over wound on R shin Neuro: alert, speech clear and understandable, less tangential than before. Grip 5/5 bilaterally. Lower extremity strength 5/5 bilaterally. Face symmetric. EOMI.  Will continue to monitor and f/u on echo results, also f/u on psychiatry consult.  Levert Feinstein, MD Family Medicine PGY-1

## 2012-02-06 NOTE — Progress Notes (Signed)
02/06/12 1204 02/06/12 1238 02/06/12 1241  Vitals  Temp 98.7 F (37.1 C) --  --   Temp src Oral --  --   BP 127/74 mmHg --  --   MAP (mmHg) 86  --  --   BP Location Right arm --  --   BP Method Automatic --  --   Patient Position, if appropriate Sitting --  --   ECG Heart Rate --  ! 158  ! 158      02/06/12 1245  Vitals  Temp --   Temp src --   BP --   MAP (mmHg) --   BP Location --   BP Method --   Patient Position, if appropriate --   ECG Heart Rate ! 166    Call to PA re Hr. Afib. Pt without complaint. Orders given. Will continue to monitor and advise attending as needed.

## 2012-02-06 NOTE — Progress Notes (Signed)
Called by Jorja Loa, RN re: conversion a-fib with RVR, rates 150-170s. Patient had been maintaining NSR on amiodarone gtt at 60 mg/hr. In an effort to transition to PO, this was down-titrated to 30 mg/hr. Since then, he has converted back to a-fib with RVR. Amiodarone bolus given with continued rates in the 140s. Have given Lopressor 5mg  IV and increased amiodarone infusion back to 60 mg/hr with rates now 80-130s and intermittent sinus rhythm. Patient does note occasional palpitations, but is otherwise asymptomatic. BP stable. Will continue to monitor as amiodarone takes effect in hopes he will convert and maintain SR.   R. Hurman Horn, PA-C 02/06/2012 2:42 PM

## 2012-02-06 NOTE — Progress Notes (Signed)
Speech Language Pathology Dysphagia Treatment Patient Details Name: Patrick Holt MRN: [id number] DOB: December 22, 1950 Today's Date: 02/06/2012 Time: 7829-5621 SLP Time Calculation (min): 42 min  Assessment / Plan / Recommendation Clinical Impression  Focus of treatment for diet tolerance of dysphagia 3 diet consistency and thin liquids following BSE completed on 02/04/12.  Direct observation of patient with mechanical soft and thin liquids with moderate encouragement for PO intake due to noted lack of understanding of purpose of treatment.  No observed s/s of aspiration noted.  Continue current diet consistency with intermittent supervision .  ST to continue to follow in acute care setting for diet tolerance.     Diet Recommendation  Continue with Current Diet: Dysphagia 3 (mechanical soft);Thin liquid    SLP Plan Continue with current plan of care      Swallowing Goals  SLP Swallowing Goals Patient will consume recommended diet without observed clinical signs of aspiration with: Minimal cueing Swallow Study Goal #1 - Progress: Progressing toward goal Patient will utilize recommended strategies during swallow to increase swallowing safety with: Minimal cueing Swallow Study Goal #2 - Progress: Progressing toward goal  General Temperature Spikes Noted: No Respiratory Status: Room air Behavior/Cognition: Alert;Cooperative;Pleasant mood;Distractible;Decreased sustained attention Oral Cavity - Dentition: Missing dentition;Poor condition Patient Positioning: Upright in bed  Oral Cavity - Oral Hygiene Does patient have any of the following "at risk" factors?: Other - dysphagia Patient is HIGH RISK - Oral Care Protocol followed (see row info): Yes Patient is AT RISK - Oral Care Protocol followed (see row info): Yes   Dysphagia Treatment Treatment focused on: Skilled observation of diet tolerance;Patient/family/caregiver education;Facilitation of oral phase Treatment Methods/Modalities:  Skilled observation;Differential diagnosis Patient observed directly with PO's: Yes Type of PO's observed: Dysphagia 3 (soft);Thin liquids Feeding: Able to feed self Liquids provided via: Cup;Straw Type of cueing: Verbal Amount of cueing: Minimal   GO    Moreen Fowler MS, CCC-SLP [phone] [facility] 02/06/2012, 10:13 AM

## 2012-02-06 NOTE — Progress Notes (Signed)
PGY-1 Daily Progress Note Family Medicine Teaching Service [place] R. Rahi Chandonnet, DO Service Pager: [phone]   Subjective: Pt doing well this AM.  Not C/O new onset palpitations or dizziness.   Objective:  VITALS Temp:  [97.6 F (36.4 C)-98.8 F (37.1 C)] 98.6 F (37 C) (12/22 0734) Pulse Rate:  [51-132] 75  (12/22 0700) Resp:  [7-23] 18  (12/22 0700) BP: (108-170)/(78-104) 143/98 mmHg (12/22 0700) SpO2:  [92 %-100 %] 95 % (12/22 0700)  In/Out  Intake/Output Summary (Last 24 hours) at 02/06/12 0843 Last data filed at 02/06/12 0700  Gross per 24 hour  Intake 1773.5 ml  Output   2700 ml  Net -926.5 ml    Physical Exam: General: Disheveled HEENT: MMM, poor dentia,  Cardiovascular: Tachycardia, sinus rhythm, S1/S2 present, no murmurs appreciated  Respiratory: CTA B/L, no wheezes, no respiratory distress  Abdomen: Soft, NT/ND, NABS  Extremities: + Ulnar deviation of fingers Skin:  +1 pitting Edema B/L , LE venous dermatitis, + 2 x 2 cm non-purulent, non-painful healing wound R proximal tibia.  Neuro: , no focal neurologic deficits  Vascular: +1 pedal pulses B/L     MEDS Scheduled Meds:    . amiodarone  150 mg Intravenous Once  . aspirin EC  81 mg Oral Daily  . collagenase   Topical Daily  . doxycycline  100 mg Oral Q12H  . folic acid  1 mg Oral Daily  . lisinopril  10 mg Oral Daily   And  . hydrochlorothiazide  12.5 mg Oral Daily  . insulin aspart  0-9 Units Subcutaneous TID WC  . levETIRAcetam  500 mg Oral BID  . magnesium oxide  400 mg Oral Daily  . methotrexate  7.5 mg Oral Weekly  . niacin  1,000 mg Oral QHS  . nicotine  21 mg Transdermal Daily  . oxybutynin  5 mg Oral TID  . potassium chloride SA  20 mEq Oral Daily  . predniSONE  5 mg Oral QAC breakfast  . sodium chloride  3 mL Intravenous Q12H  . thiamine  100 mg Oral Daily  . torsemide  10 mg Oral Daily   Continuous Infusions:    . amiodarone (NEXTERONE PREMIX) 360 mg/200 mL dextrose 60 mg/hr (02/06/12  0517)   PRN Meds:.acetaminophen, acetaminophen, albuterol-ipratropium, diphenoxylate-atropine, LORazepam, nitroGLYCERIN, ondansetron (ZOFRAN) IV, ondansetron  Labs and imaging:   CBC  Lab 02/04/12 0600 02/03/12 1819 02/03/12 1143  WBC 8.8 9.5 13.5*  HGB 15.0 14.6 15.3  HCT 44.6 44.8 46.7  PLT 240 255 277   BMET/CMET  Lab 02/04/12 1632 02/04/12 0600 02/03/12 1819 02/03/12 1143  NA 140 139 -- 140  K 3.9 2.7* -- 3.1*  CL 102 101 -- 99  CO2 26 28 -- 22  BUN 5* 4* -- 8  CREATININE 0.71 0.78 0.57 --  CALCIUM 9.3 8.7 -- 9.0  PROT -- -- -- 6.7  BILITOT -- -- -- 0.5  ALKPHOS -- -- -- 75  ALT -- -- -- 16  AST -- -- -- 18  GLUCOSE 118* 105* -- 111*   Results for orders placed during the hospital encounter of 02/03/12 (from the past 24 hour(s))  GLUCOSE, CAPILLARY     Status: Abnormal   Collection Time   02/05/12 11:36 AM      Component Value Range   Glucose-Capillary 183 (*) 70 - 99 mg/dL   Comment 1 Notify RN    GLUCOSE, CAPILLARY     Status: Abnormal   Collection Time  02/05/12  4:44 PM      Component Value Range   Glucose-Capillary 127 (*) 70 - 99 mg/dL   Comment 1 Notify RN    GLUCOSE, CAPILLARY     Status: Normal   Collection Time   02/05/12 10:15 PM      Component Value Range   Glucose-Capillary 95  70 - 99 mg/dL  GLUCOSE, CAPILLARY     Status: Abnormal   Collection Time   02/06/12  8:18 AM      Component Value Range   Glucose-Capillary 118 (*) 70 - 99 mg/dL   Dg Lumbar Spine Complete  02/03/2012  *RADIOLOGY REPORT*  Clinical Data: Fall.  Low back pain.  LUMBAR SPINE - COMPLETE 4+ VIEW  Comparison: 11/12/2011, MRI 02/18/2011  Findings: Grade 1 slip L5 on S1 is stable from prior studies. There are bilateral pars defects of L5.  Remaining lumbar alignment is normal.  No acute fracture.  Mild disc degeneration and anterior spurring L2-3 and L3-4.  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  No acute fracture.   Original Report Authenticated By:  Janeece Riggers, M.D.    Ct Head Wo Contrast  02/04/2012   IMPRESSION: Motion degraded images.  No interval change from recent CT.   Original Report Authenticated By: Charline Bills, M.D.    Ct Head Wo Contrast  02/03/2012   IMPRESSION: Normal alignment and no acute bony findings.   Original Report Authenticated By: Rudie Meyer, M.D.    Ct Cervical Spine Wo Contrast  02/03/2012  .  IMPRESSION: Normal alignment and no acute bony findings.   Original Report Authenticated By: Rudie Meyer, M.D.    Dg Pelvis Portable  02/03/2012    IMPRESSION: No acute bony findings.   Original Report Authenticated By: Rudie Meyer, M.D.    Dg Chest Port 1 View  02/03/2012  .  IMPRESSION: No acute cardiopulmonary findings and intact bony thorax.   Original Report Authenticated By: Rudie Meyer, M.D.     Assessment   ELBIE STATZER is a 61 y.o. year old male with h/o RA, Depression, HTN, diverticulosis/itis, HLD, COPD, Reflux, Seizures, Fibromyalgia, Dermatomyositis, MIO, raynaud's, hiatal hernia, neprholithiasis, esophageal dysmotility, paroxysmal A-fib, and otosclerosis presenting w/ fall and likely seizure.   1. Fall, with witnessed tonic clonic seizure- Pt presented with fall. CT scan negative in ED. Multiple labs done on admission and loaded with dilantin and one time dose of Ativan. Unsure of etiology. Pt w/ known seizure disorder but w/o seizure for >47yr. No evidence of infection or metabolic derangement. Last ativan and percocet ~2wks ago and drinks 1 glass of wine nightly. Cardiac source of fall possible as q/ h/o paroxysmal A-fib and w/ h/o incontinence, but unlikely.  1. Continue Telemetry  2. Continue following neuro recs, greatly appreciated .  TSH/B12 Nml.  Will continue thiamine supplementation and Keppra 500 BID.   3. MRI: Subarachnoid vs small subdural bleed; Neuro checks Q4, if patients becomes symptomatic page neuro surgery. 4. Pt seen by speech this AM.  Passed swallow study, now on  dysphagia 3 diet.  5. Seizure precautions, aspiration precautions, and bedrest. If becomes combative, will need restraints and Haldol PRN 6. For possible alcohol withdrawal, CIWA protocol. Oral B1 and Folate.  7. Labs from 12/20 AM show Ammonia at 46, neg Salicylate level, neg acetaminophen level  8. PT/OT evaluation when clinically improved  2. Pt with Hx of Psych disorder - Pt w/ no formal diagnoses but w/ apparent Psych  history. Pt seen by Saul Fordyce, NP [facility]). Called practice to obtain information about diagnosis and treatment and was informed that pt had been fired from [employer].  1. Pt w/ significant tangential thinking and likely confabulation.  May be combination of alcohol abuse/psychiatry disorder   2. Monitor for acute psychosis . W/ Haldol prn (QTc 0.448, would give up to QTc 0.50) 3. Inpt psych to evaluate today, greatly appreciate  3. DM II , controlled.  A1C from 12/19 at 5.5  1. SSI sensitive. No basal insulin 2. CBG with meals and QHS, if elevated, increase SSI  4. Hypokalemia - Pt found to have K+ of 2.7 on 12/20 Am 1. Will replenish this AM 40 meq KDur PO 2. Recheck BMP at 1700 today.  Consider another dose if low. 5. HTN - BP on admission 135/84  1. Will continue home meds including Metoprolol and Zestoretic 6. Dermatomyositis - Stable, no clinical evidence  1. Continue home Prednisone 5 mg qd  7. Rheumatoid Arthritis - Stable  1. Continue home MTX  8. New onset AFib w/ RVR, Hx of Ischemic Cardiomyopathy, hx of MI x 2- Unsure of baseline. EKG w/o evidenc of MI at this time.  1. Consulted Cardiology, greatly appreciate recs. Pt new onset Afib RVR, increasing amiodarone but holding anti-coagulation due possible SAH/SDH and ETOH.  Will get results of ECHO.  2. Continue ASA and BB 3. Troponins negative x 3 and repeat EKG negative for acute changes.  4. Strict I/O and daily weights  9. Skin wound with possible cellulitis - Pt  initially had non purulent, non draining, non erythematous  1. Wound care  2. BCx x 2 pending. Switched to PO doxy yesterday. F/U CBC   3. LE Doppler negative for acute DVT 10. Tobacco Abuse - Smokes about 1/2 ppd for unstated length  1. Start Nicotine patches for replacement (21) 11. Bowel and Bladder Incontinence with hx of IBS- States it is chronic  1. Will continue home meds including Oxybutynin, Lomotil, Mag Ox, Bentyl  12. Hx of COPD - No evidence of exacerbation.  1. Continue Combivent 2. O2 PRN to keep O2 > 92%  13. FEN/GI: Dysphagia Three Diet 1. Zofran PRN for nausea.  14. Prophylaxis: SCD, no Anticoagulation due to SAH/SDH 15. Disposition: Telemetry pending further w/u of AMS and AFib w/ RVR per cards 16. Code Status: Unable to obtain. Will need to discuss if has capacity per psych   Judie Grieve R. Paulina Fusi, DO of [facility] 02/06/2012, 8:43 AM

## 2012-02-07 ENCOUNTER — Encounter (HOSPITAL_COMMUNITY): Payer: Self-pay

## 2012-02-07 LAB — GLUCOSE, CAPILLARY
Glucose-Capillary: 115 mg/dL — ABNORMAL HIGH (ref 70–99)
Glucose-Capillary: 108 mg/dL — ABNORMAL HIGH (ref 70–99)
Glucose-Capillary: 117 mg/dL — ABNORMAL HIGH (ref 70–99)

## 2012-02-07 MED ORDER — DOCUSATE SODIUM 100 MG PO CAPS
100.0000 mg | ORAL_CAPSULE | Freq: Two times a day (BID) | ORAL | Status: DC
  Administered 2012-02-07 – 2012-02-08 (×2): 100 mg via ORAL
  Filled 2012-02-07 (×3): qty 1

## 2012-02-07 MED ORDER — AMIODARONE HCL 200 MG PO TABS
400.0000 mg | ORAL_TABLET | Freq: Three times a day (TID) | ORAL | Status: DC
  Administered 2012-02-07 – 2012-02-08 (×4): 400 mg via ORAL
  Filled 2012-02-07 (×6): qty 2

## 2012-02-07 NOTE — Evaluation (Signed)
Occupational Therapy Evaluation Patient Details Name: Patrick Holt MRN: [id number] DOB: 05/24/1950 Today's Date: 02/07/2012 Time: 4098-1191 OT Time Calculation (min): 27 min  OT Assessment / Plan / Recommendation Clinical Impression  Pt admitted following a seizure resulting in a fall with SDH.  Pt with psych issues and ETOH in history, questionable baseline cognition.  Pt is not safe to return home.  He lives alone and has very limited assistance available to him.  Recommending SNF.    OT Assessment  Patient needs continued OT Services    Follow Up Recommendations  SNF    Barriers to Discharge Decreased caregiver support    Equipment Recommendations  None recommended by OT    Recommendations for Other Services    Frequency  Min 2X/week    Precautions / Restrictions Precautions Precautions: Fall Restrictions Weight Bearing Restrictions: No   Pertinent Vitals/Pain No pain, pt on RA    ADL  Eating/Feeding: Independent Where Assessed - Eating/Feeding: Bed level Grooming: Wash/dry hands;Wash/dry face;Brushing hair;Set up Where Assessed - Grooming: Supported sitting Upper Body Bathing: Minimal assistance Where Assessed - Upper Body Bathing: Unsupported sitting Lower Body Bathing: Moderate assistance Where Assessed - Lower Body Bathing: Supported sit to stand Upper Body Dressing: Minimal assistance Where Assessed - Upper Body Dressing: Unsupported sitting Lower Body Dressing: Moderate assistance Where Assessed - Lower Body Dressing: Unsupported sitting;Supported sit to stand Transfers/Ambulation Related to ADLs: min assist without a device ADL Comments: Pt with LOB to L with attempt to donn L sock.    OT Diagnosis: Generalized weakness;Cognitive deficits  OT Problem List: Decreased strength;Decreased activity tolerance;Impaired balance (sitting and/or standing);Decreased cognition;Decreased safety awareness;Decreased knowledge of use of DME or AE;Cardiopulmonary status  limiting activity OT Treatment Interventions: Self-care/ADL training;DME and/or AE instruction;Cognitive remediation/compensation;Patient/family education   OT Goals Acute Rehab OT Goals OT Goal Formulation: With patient Time For Goal Achievement: 02/21/12 Potential to Achieve Goals: Good ADL Goals Pt Will Perform Grooming: with supervision;Standing at sink ADL Goal: Grooming - Progress: Goal set today Pt Will Perform Upper Body Bathing: with supervision;Sitting at sink ADL Goal: Upper Body Bathing - Progress: Goal set today Pt Will Perform Lower Body Bathing: with supervision;Standing at sink;Sitting at sink ADL Goal: Lower Body Bathing - Progress: Goal set today Pt Will Perform Upper Body Dressing: with set-up;Sitting, chair ADL Goal: Upper Body Dressing - Progress: Goal set today Pt Will Perform Lower Body Dressing: with supervision;Sit to stand from chair ADL Goal: Lower Body Dressing - Progress: Goal set today Pt Will Transfer to Toilet: with supervision;Ambulation;Regular height toilet ADL Goal: Toilet Transfer - Progress: Goal set today Pt Will Perform Toileting - Clothing Manipulation: with supervision;Standing ADL Goal: Toileting - Clothing Manipulation - Progress: Goal set today Pt Will Perform Toileting - Hygiene: with supervision;Sitting on 3-in-1 or toilet ADL Goal: Toileting - Hygiene - Progress: Goal set today  Visit Information  Last OT Received On: 02/07/12 Assistance Needed: +1 PT/OT Co-Evaluation/Treatment: Yes    Subjective Data  Subjective: "I don't know what I would do without my ex mother in law."   Prior Functioning     Home Living Lives With: Alone Available Help at Discharge: Available PRN/intermittently Type of Home: House Home Access: Ramped entrance Home Layout: One level Bathroom Shower/Tub: Walk-in shower Home Adaptive Equipment: Barrister's clerk cane;Walker - rolling Additional Comments: pt unable to recall bathroom set up at  home Prior Function Level of Independence: Independent Able to Take Stairs?: No Driving: No Vocation: On disability Comments: pt notes using power scooter when  outside of home.  Only warms up meals in microwave.  pt notes he uses a cab to go to grocery store.   Communication Communication: No difficulties Dominant Hand: Right         Vision/Perception     Cognition  Overall Cognitive Status: Impaired Area of Impairment: Attention;Memory;Safety/judgement;Awareness of deficits;Problem solving;Executive functioning Arousal/Alertness: Awake/alert Orientation Level: Appears intact for tasks assessed Behavior During Session: Flat affect Current Attention Level: Sustained Safety/Judgement: Decreased safety judgement for tasks assessed;Decreased awareness of need for assistance Awareness of Deficits: pt unaware for need for A.   Problem Solving: pt slow to process.   Cognition - Other Comments: ? if cognitive deficits are baseline.      Extremity/Trunk Assessment Right Upper Extremity Assessment RUE ROM/Strength/Tone: WFL for tasks assessed Left Upper Extremity Assessment LUE ROM/Strength/Tone: WFL for tasks assessed Right Lower Extremity Assessment RLE ROM/Strength/Tone: Deficits RLE ROM/Strength/Tone Deficits: Generally weak, 4/5 Left Lower Extremity Assessment LLE ROM/Strength/Tone: Deficits LLE ROM/Strength/Tone Deficits: Generally weak, 4/5 Trunk Assessment Trunk Assessment: Normal     Mobility Bed Mobility Bed Mobility: Supine to Sit;Sitting - Scoot to Edge of Bed Supine to Sit: 5: Supervision;With rails Sitting - Scoot to Edge of Bed: 5: Supervision Details for Bed Mobility Assistance: Needs increased time and use of bed rail to complete without A.   Transfers Sit to Stand: 4: Min assist;With upper extremity assist;From bed Stand to Sit: 4: Min guard;With upper extremity assist;To chair/3-in-1;With armrests Details for Transfer Assistance: cues forUE use, getting  closer to chair prior to sitting, attending to task.       Shoulder Instructions     Exercise     Balance Balance Balance Assessed: Yes Dynamic Sitting Balance Dynamic Sitting - Balance Support: Feet supported Dynamic Sitting - Level of Assistance: 5: Stand by assistance Dynamic Sitting - Balance Activities: Forward lean/weight shifting;Lateral lean/weight shifting Static Standing Balance Static Standing - Balance Support: No upper extremity supported Static Standing - Level of Assistance: 4: Min assist Static Standing - Comment/# of Minutes: pt unsteady and requiring MinA to maintain balance.     End of Session OT - End of Session Activity Tolerance: Patient limited by fatigue Patient left: in chair;with call bell/phone within reach Nurse Communication: Mobility status  GO     Evern Bio 02/07/2012, 11:12 AM [phone]
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
## 2012-02-07 NOTE — Evaluation (Signed)
Physical Therapy Evaluation Patient Details Name: Patrick Holt MRN: [id number] DOB: [birth year] Today's Date: 02/07/2012 Time: 2440-1027 PT Time Calculation (min): 25 min  PT Assessment / Plan / Recommendation Clinical Impression  pt presents with Seizure resulting in fall with SDH, A-fib, and hx of Seizures.  pt with baseline cognitive deficits limiting safety.  At this time pt is not safe for return home alone and he notes he does not have any family to A him other than his ex-mother-in-law who can not provide much assistance.  pt will need SNF at D/C.      PT Assessment  Patient needs continued PT services    Follow Up Recommendations  SNF    Does the patient have the potential to tolerate intense rehabilitation      Barriers to Discharge None      Equipment Recommendations  None recommended by PT    Recommendations for Other Services     Frequency Min 3X/week    Precautions / Restrictions Precautions Precautions: Fall Restrictions Weight Bearing Restrictions: No   Pertinent Vitals/Pain Denies pain.        Mobility  Bed Mobility Bed Mobility: Supine to Sit;Sitting - Scoot to Edge of Bed Supine to Sit: 5: Supervision;With rails Sitting - Scoot to Edge of Bed: 5: Supervision Details for Bed Mobility Assistance: Needs increased time and use of bed rail to complete without A.   Transfers Transfers: Sit to Stand;Stand to Sit Sit to Stand: 4: Min assist;With upper extremity assist;From bed Stand to Sit: 4: Min guard;With upper extremity assist;To chair/3-in-1;With armrests Details for Transfer Assistance: cues forUE use, getting closer to chair prior to sitting, attending to task.   Ambulation/Gait Ambulation/Gait Assistance: 4: Min assist Ambulation Distance (Feet): 15 Feet Assistive device: 1 person hand held assist Ambulation/Gait Assistance Details: pt generally unsteady.  pt normally uses either a cane or RW at home.  pt needs cueing to attend to task.    Gait Pattern: Step-through pattern;Decreased stride length;Narrow base of support Stairs: No Wheelchair Mobility Wheelchair Mobility: No    Shoulder Instructions     Exercises     PT Diagnosis: Difficulty walking  PT Problem List: Decreased strength;Decreased activity tolerance;Decreased balance;Decreased mobility;Decreased coordination;Decreased cognition;Decreased knowledge of use of DME;Decreased safety awareness PT Treatment Interventions: DME instruction;Gait training;Functional mobility training;Therapeutic activities;Therapeutic exercise;Balance training;Cognitive remediation;Patient/family education   PT Goals Acute Rehab PT Goals PT Goal Formulation: With patient Time For Goal Achievement: 02/14/12 Potential to Achieve Goals: Good Pt will go Supine/Side to Sit: with modified independence PT Goal: Supine/Side to Sit - Progress: Goal set today Pt will go Sit to Supine/Side: with modified independence PT Goal: Sit to Supine/Side - Progress: Goal set today Pt will go Sit to Stand: with modified independence PT Goal: Sit to Stand - Progress: Goal set today Pt will go Stand to Sit: with modified independence PT Goal: Stand to Sit - Progress: Goal set today Pt will Ambulate: >150 feet;with modified independence;with rolling walker PT Goal: Ambulate - Progress: Goal set today  Visit Information  Last PT Received On: 02/07/12 Assistance Needed: +1 PT/OT Co-Evaluation/Treatment: Yes    Subjective Data  Subjective: I only have my ex-mother-in-law.   Patient Stated Goal: Home   Prior Functioning  Home Living Lives With: Alone Available Help at Discharge:  (No one per pt.  ) Type of Home: House Home Access: Ramped entrance Home Layout: One level Bathroom Shower/Tub: Naval architect Equipment: Barrister's clerk cane;Walker - rolling Prior Function Level of Independence:  Independent Able to Take Stairs?: No Driving: No Vocation: On  disability Comments: pt notes using power scooter when outside of home.  Only warms up meals in microwave.  pt notes he uses a cab to go to grocery store.   Communication Communication: No difficulties    Cognition  Overall Cognitive Status: Impaired Area of Impairment: Attention;Memory;Safety/judgement;Awareness of deficits;Problem solving;Executive functioning Arousal/Alertness: Awake/alert Orientation Level: Appears intact for tasks assessed Behavior During Session: Flat affect Current Attention Level: Sustained Safety/Judgement: Decreased safety judgement for tasks assessed;Decreased awareness of need for assistance Awareness of Deficits: pt unaware for need for A.   Problem Solving: pt slow to process.   Cognition - Other Comments: ? if cognitive deficits are baseline.      Extremity/Trunk Assessment Right Lower Extremity Assessment RLE ROM/Strength/Tone: Deficits RLE ROM/Strength/Tone Deficits: Generally weak, 4/5 Left Lower Extremity Assessment LLE ROM/Strength/Tone: Deficits LLE ROM/Strength/Tone Deficits: Generally weak, 4/5 Trunk Assessment Trunk Assessment: Normal   Balance Balance Balance Assessed: Yes Static Standing Balance Static Standing - Balance Support: No upper extremity supported Static Standing - Level of Assistance: 4: Min assist Static Standing - Comment/# of Minutes: pt unsteady and requiring MinA to maintain balance.    End of Session PT - End of Session Equipment Utilized During Treatment: Gait belt Activity Tolerance: Patient tolerated treatment well Patient left: in chair;with call bell/phone within reach Nurse Communication: Mobility status  GP     Sunny Schlein, [place] [phone] 02/07/2012, 9:21 AM
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
## 2012-02-07 NOTE — Progress Notes (Signed)
PGY-1 Daily Progress Note Family Medicine Teaching Service Cayey R. Markesha Hannig, DO Service Pager: [phone]   Subjective: Pt doing well this AM.  Not C/O new onset palpitations or dizziness. Worried about his pants and wanting to find them.   Objective:  VITALS Temp:  [97.8 F (36.6 C)-98.9 F (37.2 C)] 98.9 F (37.2 C) (12/23 0745) Pulse Rate:  [26-86] 85  (12/23 0800) Resp:  [11-19] 16  (12/23 0800) BP: (105-145)/(70-99) 123/75 mmHg (12/23 0800) SpO2:  [92 %-99 %] 95 % (12/23 0800)  In/Out  Intake/Output Summary (Last 24 hours) at 02/07/12 0914 Last data filed at 02/07/12 0800  Gross per 24 hour  Intake 1101.49 ml  Output    875 ml  Net 226.49 ml    Physical Exam: General: Disheveled HEENT: MMM, poor dentia,  Cardiovascular: RRR, S1/S2 present, no murmurs appreciated  Respiratory: CTA B/L, no wheezes, no respiratory distress  Abdomen: Soft, NT/ND, NABS  Extremities: + Ulnar deviation of fingers Skin:  +1 pitting Edema B/L , LE venous dermatitis, + 2 x 2 cm non-purulent, non-painful healing wound R proximal tibia.  Neuro: , no focal neurologic deficits  Vascular: +1 pedal pulses B/L     MEDS Scheduled Meds:    . amiodarone  150 mg Intravenous Once  . aspirin EC  81 mg Oral Daily  . collagenase   Topical Daily  . doxycycline  100 mg Oral Q12H  . folic acid  1 mg Oral Daily  . lisinopril  10 mg Oral Daily   And  . hydrochlorothiazide  12.5 mg Oral Daily  . insulin aspart  0-9 Units Subcutaneous TID WC  . levETIRAcetam  500 mg Oral BID  . magnesium oxide  400 mg Oral Daily  . methotrexate  7.5 mg Oral Weekly  . niacin  1,000 mg Oral QHS  . nicotine  21 mg Transdermal Daily  . oxybutynin  5 mg Oral TID  . potassium chloride SA  20 mEq Oral Daily  . predniSONE  5 mg Oral QAC breakfast  . sodium chloride  3 mL Intravenous Q12H  . thiamine  100 mg Oral Daily  . torsemide  10 mg Oral Daily   Continuous Infusions:    . amiodarone (NEXTERONE PREMIX) 360 mg/200 mL  dextrose 59.94 mg/hr (02/07/12 0800)   PRN Meds:.acetaminophen, acetaminophen, albuterol-ipratropium, diphenoxylate-atropine, LORazepam, nitroGLYCERIN, ondansetron (ZOFRAN) IV, ondansetron  Labs and imaging:   CBC  Lab 02/06/12 1103 02/04/12 0600 02/03/12 1819  WBC 11.6* 8.8 9.5  HGB 17.1* 15.0 14.6  HCT 49.6 44.6 44.8  PLT 236 240 255   BMET/CMET  Lab 02/06/12 1103 02/04/12 1632 02/04/12 0600 02/03/12 1143  NA 137 140 139 --  K 3.5 3.9 2.7* --  CL 96 102 101 --  CO2 32 26 28 --  BUN 9 5* 4* --  CREATININE 0.89 0.71 0.78 --  CALCIUM 9.5 9.3 8.7 --  PROT -- -- -- 6.7  BILITOT -- -- -- 0.5  ALKPHOS -- -- -- 75  ALT -- -- -- 16  AST -- -- -- 18  GLUCOSE 123* 118* 105* --   Results for orders placed during the hospital encounter of 02/03/12 (from the past 24 hour(s))  CBC     Status: Abnormal   Collection Time   02/06/12 11:03 AM      Component Value Range   WBC 11.6 (*) 4.0 - 10.5 K/uL   RBC 4.76  4.22 - 5.81 MIL/uL   Hemoglobin 17.1 (*) 13.0 -  17.0 g/dL   HCT 21.3  08.6 - 57.8 %   MCV 104.2 (*) 78.0 - 100.0 fL   MCH 35.9 (*) 26.0 - 34.0 pg   MCHC 34.5  30.0 - 36.0 g/dL   RDW 46.9  62.9 - 52.8 %   Platelets 236  150 - 400 K/uL  BASIC METABOLIC PANEL     Status: Abnormal   Collection Time   02/06/12 11:03 AM      Component Value Range   Sodium 137  135 - 145 mEq/L   Potassium 3.5  3.5 - 5.1 mEq/L   Chloride 96  96 - 112 mEq/L   CO2 32  19 - 32 mEq/L   Glucose, Bld 123 (*) 70 - 99 mg/dL   BUN 9  6 - 23 mg/dL   Creatinine, Ser 4.13  0.50 - 1.35 mg/dL   Calcium 9.5  8.4 - 24.4 mg/dL   GFR calc non Af Amer 90 (*) >90 mL/min   GFR calc Af Amer >90  >90 mL/min  GLUCOSE, CAPILLARY     Status: Abnormal   Collection Time   02/06/12 12:13 PM      Component Value Range   Glucose-Capillary 156 (*) 70 - 99 mg/dL  GLUCOSE, CAPILLARY     Status: Abnormal   Collection Time   02/06/12  4:32 PM      Component Value Range   Glucose-Capillary 100 (*) 70 - 99 mg/dL   GLUCOSE, CAPILLARY     Status: Abnormal   Collection Time   02/06/12  9:38 PM      Component Value Range   Glucose-Capillary 115 (*) 70 - 99 mg/dL  GLUCOSE, CAPILLARY     Status: Abnormal   Collection Time   02/07/12  8:01 AM      Component Value Range   Glucose-Capillary 108 (*) 70 - 99 mg/dL   Dg Lumbar Spine Complete  02/03/2012  *RADIOLOGY REPORT*  Clinical Data: Fall.  Low back pain.  LUMBAR SPINE - COMPLETE 4+ VIEW  Comparison: 11/12/2011, MRI 02/18/2011  Findings: Grade 1 slip L5 on S1 is stable from prior studies. There are bilateral pars defects of L5.  Remaining lumbar alignment is normal.  No acute fracture.  Mild disc degeneration and anterior spurring L2-3 and L3-4.  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  No acute fracture.   Original Report Authenticated By: Janeece Riggers, M.D.    Ct Head Wo Contrast  02/04/2012   IMPRESSION: Motion degraded images.  No interval change from recent CT.   Original Report Authenticated By: Charline Bills, M.D.    Ct Head Wo Contrast  02/03/2012   IMPRESSION: Normal alignment and no acute bony findings.   Original Report Authenticated By: Rudie Meyer, M.D.    Ct Cervical Spine Wo Contrast  02/03/2012  .  IMPRESSION: Normal alignment and no acute bony findings.   Original Report Authenticated By: Rudie Meyer, M.D.    Dg Pelvis Portable  02/03/2012    IMPRESSION: No acute bony findings.   Original Report Authenticated By: Rudie Meyer, M.D.    Dg Chest Port 1 View  02/03/2012  .  IMPRESSION: No acute cardiopulmonary findings and intact bony thorax.   Original Report Authenticated By: Rudie Meyer, M.D.     Assessment   FABION GATSON is a 61 y.o. year old male with h/o RA, Depression, HTN, diverticulosis/itis, HLD, COPD, Reflux, Seizures, Fibromyalgia, Dermatomyositis, MIO, raynaud's, hiatal hernia, neprholithiasis, esophageal dysmotility, paroxysmal  A-fib, and otosclerosis presenting w/ fall and likely  seizure.   1. Fall, with witnessed tonic clonic seizure- Pt presented with fall. CT scan negative in ED. Multiple labs done on admission and loaded with dilantin and one time dose of Ativan. Unsure of etiology. Pt w/ known seizure disorder but w/o seizure for >23yr. No evidence of infection or metabolic derangement. Last ativan and percocet ~2wks ago and drinks 1 glass of wine nightly. Cardiac source of fall possible as q/ h/o paroxysmal A-fib and w/ h/o incontinence, but unlikely.  1. Continue Telemetry  2. Continue following neuro recs, greatly appreciated .  TSH/B12 Nml.  Will continue thiamine supplementation and Keppra 500 BID.   3. MRI: Subarachnoid vs small subdural bleed; Neuro checks Q4, if patients becomes symptomatic page neuro surgery. 4.  Passed swallow study, now on dysphagia 3 diet.  5. Seizure precautions, aspiration precautions, and bedrest. If becomes combative, will need restraints and Haldol PRN 6. For possible alcohol withdrawal, CIWA protocol. Oral B1 and Folate.  7. Labs from 12/20 AM show Ammonia at 46, neg Salicylate level, neg acetaminophen level  8. PT/OT evaluation when clinically improved  2. Pt with Hx of Psych disorder - Pt w/ no formal diagnoses but w/ apparent Psych history. Pt seen by Saul Fordyce, NP [facility]). Called practice to obtain information about diagnosis and treatment and was informed that pt had been fired from [employer].  1. Pt w/ significant tangential thinking and likely confabulation.  May be combination of alcohol abuse/psychiatry disorder   2. Monitor for acute psychosis . W/ Haldol prn (QTc 0.448, would give up to QTc 0.50) 3. Inpt psych evaluated, greatly appreciate.  Will restart Cymbalta, Wellbutrin, and xanax when stable and f/u with outpatient psych upon d/c.  3. DM II , controlled.  A1C from 12/19 at 5.5  1. SSI sensitive. No basal insulin 2. CBG with meals and QHS, if elevated, increase SSI   4. Hypokalemia - Pt found to have K+ of 2.7 on 12/20 Am 1. Resolved after oral repletion  5. HTN - BP on admission 135/84  1. Will continue home meds including Metoprolol and Zestoretic 6. Dermatomyositis - Stable, no clinical evidence  1. Continue home Prednisone 5 mg qd  7. Rheumatoid Arthritis - Stable  1. Continue home MTX  8. New onset AFib w/ RVR, Hx of Ischemic Cardiomyopathy, hx of MI x 2- Unsure of baseline. EKG w/o evidenc of MI at this time.  1. Consulted Cardiology, greatly appreciate recs. Pt new onset Afib RVR, continues on amiodarone per cardiology but holding anti-coagulation due possible SAH/SDH and ETOH.  Will get results of ECHO today 2. Continue ASA and BB 3. Troponins negative x 3 and repeat EKG negative for acute changes.  4. Strict I/O and daily weights  9. Skin wound with possible cellulitis - Pt initially had non purulent, non draining, non erythematous  1. Wound care  2. BCx x 2 NGTD. Oral doxy continuing F/U CBC   3. LE Doppler negative for acute DVT 10. Tobacco Abuse - Smokes about 1/2 ppd for unstated length  1. Start Nicotine patches for replacement (21) 11. Bowel and Bladder Incontinence with hx of IBS- States it is chronic  1. Will continue home meds including Oxybutynin, Lomotil, Mag Ox, Bentyl  12. Hx of COPD - No evidence of exacerbation.  1. Continue Combivent 2. O2 PRN to keep O2 > 92%  13. FEN/GI: Dysphagia Three Diet 1. Zofran PRN for nausea.  14. Prophylaxis: SCD, no Anticoagulation due to SAH/SDH 15. Disposition: Telemetry pending further w/u of AMS and AFib w/ RVR per cards 16. Code Status: Unable to obtain. Will need to discuss if has capacity per psych   Judie Grieve R. Paulina Fusi, DO of [facility] 02/07/2012, 9:14 AM

## 2012-02-07 NOTE — Progress Notes (Signed)
  Patient Name: Patrick Holt      SUBJECTIVE: admited with siezures and SAH after fall Had PAF converted   Back into AFib ON amio>>NSR  Past Medical History  Diagnosis Date  . Rheumatoid arthritis   . Obesity   . Major depression     with acute psychotic break in 06/2010  . Diabetes mellitus   . Hypertension   . Hyperlipidemia   . Diverticulosis of colon   . COPD (chronic obstructive pulmonary disease)   . Anxiety   . GERD (gastroesophageal reflux disease)   . Vertigo   . Seizures   . Fibromyalgia   . Dermatomyositis   . Myocardial infarct     mulitple (1999, 2000, 2004)  . Raynaud's disease   . Narcotic dependence   . Peripheral neuropathy   . Internal hemorrhoids   . Ischemic heart disease   . Hiatal hernia   . Gastritis   . Diverticulitis   . Hx of adenomatous colonic polyps   . Nephrolithiasis   . Anemia   . Esophageal stricture   . Esophageal dysmotility   . Dermatomyositis   . Paroxysmal a-fib   . Urge incontinence   . Otosclerosis     PHYSICAL EXAM   BP 123/75  Pulse 85  Temp 98.9 F (37.2 C) (Oral)  Resp 16  Ht 5\' 9"  (1.753 m)  Wt 232 lb 9.4 oz (105.5 kg)  BMI 34.35 kg/m2  SpO2 95% Well developed and nourished in no acute distress HENT normal Neck supple with JVP-flat Clear Regular rate and rhythm, no murmurs or gallops Abd-soft with active BS No Clubbing cyanosis edema Skin-warm and dry A & Oriented  Grossly normal sensory and motor function   ELEMETRY: Reviewed telemetry pt in nsr :    Intake/Output Summary (Last 24 hours) at 02/07/12 0832 Last data filed at 02/07/12 0800  Gross per 24 hour  Intake 1341.49 ml  Output    875 ml  Net 466.49 ml    LABS: Basic Metabolic Panel:  Lab 02/06/12 0981 02/04/12 1632 02/04/12 0600 02/03/12 1819 02/03/12 1143  NA 137 140 139 -- 140  K 3.5 3.9 2.7* -- 3.1*  CL 96 102 101 -- 99  CO2 32 26 28 -- 22  GLUCOSE 123* 118* 105* -- 111*  BUN 9 5* 4* -- 8  CREATININE 0.89 0.71 0.78 0.57  0.81  CALCIUM 9.5 9.3 -- -- --  MG -- -- -- -- --  PHOS -- -- -- -- --   Cardiac Enzymes: No results found for this basename: CKTOTAL:3,CKMB:3,CKMBINDEX:3,TROPONINI:3 in the last 72 hours CBC:  Lab 02/06/12 1103 02/04/12 0600 02/03/12 1819 02/03/12 1143  WBC 11.6* 8.8 9.5 13.5*  NEUTROABS -- -- -- 8.0*  HGB 17.1* 15.0 14.6 15.3  HCT 49.6 44.6 44.8 46.7  MCV 104.2* 103.7* 101.6* 106.4*  PLT 236 240 255 277   Device Interrogation: Echo normal lv function with mild lae  ASSESSMENT AND PLAN:  Patient Active Hospital Problem List: SDH (subdural hematoma) (02/05/2012)   Atrial fibrillation (02/05/2012)   Coronary atherosclerosis of native coronary artery (02/05/2012)   Reasonable to continue amio Although not clear to me who has been prescribing sotalol   He has seen TW in past and will arrange followup with him in hte future Will need PFTs for baseline; TSH and LFTs ok  Has multiple ocncerns re healthcare and dental care which i hope FP service can address  Signed, Sherryl Manges MD  02/07/2012
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
## 2012-02-08 DIAGNOSIS — F17200 Nicotine dependence, unspecified, uncomplicated: Secondary | ICD-10-CM

## 2012-02-08 MED ORDER — AMIODARONE HCL 400 MG PO TABS: 400.0000 mg | ORAL_TABLET | Freq: Three times a day (TID) | ORAL | Status: DC

## 2012-02-08 MED ORDER — AMIODARONE HCL 200 MG PO TABS: 400.0000 mg | ORAL_TABLET | Freq: Every day | ORAL | Status: DC

## 2012-02-08 MED ORDER — TRAZODONE HCL 50 MG PO TABS: 50.0000 mg | ORAL_TABLET | Freq: Every day | ORAL | Status: DC

## 2012-02-08 MED ORDER — LEVETIRACETAM 500 MG PO TABS: 500.0000 mg | ORAL_TABLET | Freq: Two times a day (BID) | ORAL | Status: DC

## 2012-02-08 MED ORDER — AMIODARONE HCL 200 MG PO TABS: 200.0000 mg | ORAL_TABLET | Freq: Every day | ORAL | Status: DC

## 2012-02-08 MED ORDER — AMIODARONE HCL 200 MG PO TABS: 400.0000 mg | ORAL_TABLET | Freq: Two times a day (BID) | ORAL | Status: DC

## 2012-02-08 MED ORDER — DOXYCYCLINE HYCLATE 100 MG PO TABS: 100.0000 mg | ORAL_TABLET | Freq: Two times a day (BID) | ORAL | Status: DC

## 2012-02-08 NOTE — Progress Notes (Signed)
Clinical Social Work Progress Note PSYCHIATRY SERVICE LINE 02/08/2012  Patient:  Patrick Holt  Account:  [id number]  Admit Date:  02/03/2012  Clinical Social Worker:  Unk Lightning, LCSW  Date/Time:  02/08/2012 09:45 AM  Review of Patient  Overall Medical Condition:   Patient reports he feels better and is anxious to get home. Patient reports he is going to dc today.   Participation Level:  Active  Participation Quality  Appropriate   Other Participation Quality:   Affect  Flat   Cognitive  Alert   Reaction to Medications/Concerns:   None reported   Modes of Intervention  Education   Summary of Progress/Plan at Discharge   CSW met with patient at bedside. Patient sitting in chair and watching tv. Patient is agreeable to session with CSW. Patient reports he is anxious to get home. During session, patient's ex mother-in-law called and is agreeable to assist patient at dc. She has patient's keys and is willing to assist with transportation if needed or just bring patient's keys to his house.    Patient spoke with CSW in further detail regarding his support system and his sadness that he only has one friend. Patient spoke about struggles regarding using [employer] and the inconvenience of transportation. CSW and patient discussed the importance of follow up appointments and using [employer] even if patient felt it was inconvenient. Patient agreeable to follow up appointments.    CSW provided patient with referral to [facility] as alternative agency for psych follow up. Patient is aware of location. Agency accepts patient's insurance and agreeable to accept new patients. CSW placed information on AVS and patient reports he will schedule intake appointment after Christmas.

## 2012-02-08 NOTE — Progress Notes (Signed)
PGY-1 Daily Progress Note Family Medicine Teaching Service [place] R. Natassia Guthridge, DO Service Pager: [phone]   Subjective: Pt doing well this AM.  Not C/O new onset palpitations or dizziness. Is ready to go home today.   Objective:  VITALS Temp:  [98.1 F (36.7 C)-99 F (37.2 C)] 98.1 F (36.7 C) (12/24 0745) Pulse Rate:  [68-146] 92  (12/24 0324) Resp:  [16-18] 18  (12/24 0745) BP: (90-123)/(59-77) 102/67 mmHg (12/24 0324) SpO2:  [90 %-97 %] 91 % (12/24 0324)  In/Out  Intake/Output Summary (Last 24 hours) at 02/08/12 4782 Last data filed at 02/08/12 0300  Gross per 24 hour  Intake 393.33 ml  Output    875 ml  Net -481.67 ml    Physical Exam: General: Disheveled HEENT: MMM, poor dentia,  Cardiovascular: RRR, S1/S2 present, no murmurs appreciated  Respiratory: CTA B/L, no wheezes, no respiratory distress  Abdomen: Soft, NT/ND, NABS  Extremities: + Ulnar deviation of fingers Skin:  , LE venous dermatitis, + 2 x 2 cm non-purulent, non-painful healing wound R proximal tibia.  Neuro: no focal neurologic deficits     MEDS Scheduled Meds:    . amiodarone  400 mg Oral Q8H  . aspirin EC  81 mg Oral Daily  . collagenase   Topical Daily  . docusate sodium  100 mg Oral BID  . doxycycline  100 mg Oral Q12H  . folic acid  1 mg Oral Daily  . lisinopril  10 mg Oral Daily   And  . hydrochlorothiazide  12.5 mg Oral Daily  . insulin aspart  0-9 Units Subcutaneous TID WC  . levETIRAcetam  500 mg Oral BID  . magnesium oxide  400 mg Oral Daily  . methotrexate  7.5 mg Oral Weekly  . niacin  1,000 mg Oral QHS  . nicotine  21 mg Transdermal Daily  . oxybutynin  5 mg Oral TID  . potassium chloride SA  20 mEq Oral Daily  . predniSONE  5 mg Oral QAC breakfast  . sodium chloride  3 mL Intravenous Q12H  . thiamine  100 mg Oral Daily  . torsemide  10 mg Oral Daily   Continuous Infusions:   PRN Meds:.acetaminophen, acetaminophen, albuterol-ipratropium, diphenoxylate-atropine, LORazepam,  nitroGLYCERIN, ondansetron (ZOFRAN) IV, ondansetron  Labs and imaging:   CBC  Lab 02/06/12 1103 02/04/12 0600 02/03/12 1819  WBC 11.6* 8.8 9.5  HGB 17.1* 15.0 14.6  HCT 49.6 44.6 44.8  PLT 236 240 255   BMET/CMET  Lab 02/06/12 1103 02/04/12 1632 02/04/12 0600 02/03/12 1143  NA 137 140 139 --  K 3.5 3.9 2.7* --  CL 96 102 101 --  CO2 32 26 28 --  BUN 9 5* 4* --  CREATININE 0.89 0.71 0.78 --  CALCIUM 9.5 9.3 8.7 --  PROT -- -- -- 6.7  BILITOT -- -- -- 0.5  ALKPHOS -- -- -- 75  ALT -- -- -- 16  AST -- -- -- 18  GLUCOSE 123* 118* 105* --   Results for orders placed during the hospital encounter of 02/03/12 (from the past 24 hour(s))  GLUCOSE, CAPILLARY     Status: Abnormal   Collection Time   02/07/12 11:52 AM      Component Value Range   Glucose-Capillary 109 (*) 70 - 99 mg/dL  GLUCOSE, CAPILLARY     Status: Abnormal   Collection Time   02/07/12  4:50 PM      Component Value Range   Glucose-Capillary 120 (*) 70 - 99  mg/dL  GLUCOSE, CAPILLARY     Status: Abnormal   Collection Time   02/07/12  9:19 PM      Component Value Range   Glucose-Capillary 117 (*) 70 - 99 mg/dL  GLUCOSE, CAPILLARY     Status: Abnormal   Collection Time   02/08/12  7:47 AM      Component Value Range   Glucose-Capillary 104 (*) 70 - 99 mg/dL   Dg Lumbar Spine Complete  02/03/2012  *RADIOLOGY REPORT*  Clinical Data: Fall.  Low back pain.  LUMBAR SPINE - COMPLETE 4+ VIEW  Comparison: 11/12/2011, MRI 02/18/2011  Findings: Grade 1 slip L5 on S1 is stable from prior studies. There are bilateral pars defects of L5.  Remaining lumbar alignment is normal.  No acute fracture.  Mild disc degeneration and anterior spurring L2-3 and L3-4.  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  No acute fracture.   Original Report Authenticated By: Janeece Riggers, M.D.    Ct Head Wo Contrast  02/04/2012   IMPRESSION: Motion degraded images.  No interval change from recent CT.   Original Report  Authenticated By: Charline Bills, M.D.    Ct Head Wo Contrast  02/03/2012   IMPRESSION: Normal alignment and no acute bony findings.   Original Report Authenticated By: Rudie Meyer, M.D.    Ct Cervical Spine Wo Contrast  02/03/2012  .  IMPRESSION: Normal alignment and no acute bony findings.   Original Report Authenticated By: Rudie Meyer, M.D.    Dg Pelvis Portable  02/03/2012    IMPRESSION: No acute bony findings.   Original Report Authenticated By: Rudie Meyer, M.D.    Dg Chest Port 1 View  02/03/2012  .  IMPRESSION: No acute cardiopulmonary findings and intact bony thorax.   Original Report Authenticated By: Rudie Meyer, M.D.     Assessment   Patrick Holt is a 61 y.o. year old male with h/o RA, Depression, HTN, diverticulosis/itis, HLD, COPD, Reflux, Seizures, Fibromyalgia, Dermatomyositis, MIO, raynaud's, hiatal hernia, neprholithiasis, esophageal dysmotility, paroxysmal A-fib, and otosclerosis presenting w/ fall and likely seizure.   1. Fall, with witnessed tonic clonic seizure- Pt presented with fall. CT scan negative in ED. Multiple labs done on admission and loaded with dilantin and one time dose of Ativan. Unsure of etiology. Pt w/ known seizure disorder but w/o seizure for >42yr. No evidence of infection or metabolic derangement. Last ativan and percocet ~2wks ago and drinks 1 glass of wine nightly. Cardiac source of fall possible as q/ h/o paroxysmal A-fib and w/ h/o incontinence, but unlikely.  1. Continue Telemetry  2. Continue following neuro recs, greatly appreciated .  TSH/B12 Nml.  Will continue thiamine supplementation and Keppra 500 BID.   3. MRI: Subarachnoid vs small subdural bleed; Neuro checks Q4, if patients becomes symptomatic page neuro surgery. 4.  Passed swallow study, now on dysphagia 3 diet.  5. Seizure precautions, aspiration precautions, and bedrest. If becomes combative, will need restraints and Haldol PRN 6. For possible alcohol withdrawal,  CIWA protocol. Oral B1 and Folate.  7. Labs from 12/20 AM show Ammonia at 46, neg Salicylate level, neg acetaminophen level  8. PT/OT evaluation when clinically improved  2. Pt with Hx of Psych disorder - Pt w/ no formal diagnoses but w/ apparent Psych history. Pt seen by Saul Fordyce, NP [facility]). Called practice to obtain information about diagnosis and treatment and was informed that pt had been fired from [employer].  1.  Pt w/ significant tangential thinking and likely confabulation.  May be combination of alcohol abuse/psychiatry disorder   2. Monitor for acute psychosis . W/ Haldol prn (QTc 0.448, would give up to QTc 0.50) 3. Inpt psych evaluated, greatly appreciate. Pt has capacity and may need outpatient psych f/u.   Will restart Cymbalta and xanax upon d/c.  3. DM II , controlled.  A1C from 12/19 at 5.5  1. SSI sensitive. No basal insulin 2. CBG with meals and QHS, if elevated, increase SSI  4. Hypokalemia - Pt found to have K+ of 2.7 on 12/20 Am 1. Resolved after oral repletion  5. HTN - BP on admission 135/84  1. Will continue home meds including Metoprolol and Zestoretic 6. Dermatomyositis - Stable, no clinical evidence  1. Continue home Prednisone 5 mg qd  7. Rheumatoid Arthritis - Stable  1. Continue home MTX  8. New onset AFib w/ RVR, Hx of Ischemic Cardiomyopathy, hx of MI x 2- Unsure of baseline. EKG w/o evidenc of MI at this time.  1. Consulted Cardiology, greatly appreciate recs. Pt new onset Afib RVR, continues on amiodarone per cardiology but holding anti-coagulation due possible SAH/SDH and ETOH.  Will send home on taper of Amiodarone and have pt f/u with Dr. Daleen Squibb in 3-4 weeks for adjustments.  2. Continue ASA and BB 3. Troponins negative x 3 and repeat EKG negative for acute changes.  4. Strict I/O and daily weights  9. Skin wound with possible cellulitis - Pt initially had non purulent, non draining, non erythematous  1. Wound care   2. BCx x 2 NGTD. Will continue Doxy for 5 more days (8 days total)  3. LE Doppler negative for acute DVT 10. Tobacco Abuse - Smokes about 1/2 ppd for unstated length  1. Start Nicotine patches for replacement (21) 11. Bowel and Bladder Incontinence with hx of IBS- States it is chronic  1. Will continue home meds including Oxybutynin, Lomotil, Mag Ox, Bentyl  12. Hx of COPD - No evidence of exacerbation.  1. Continue Combivent 2. O2 PRN to keep O2 > 92%  13. FEN/GI: Dysphagia Three Diet 1. Zofran PRN for nausea.  14. Prophylaxis: SCD, no Anticoagulation due to SAH/SDH 15. Disposition: Stable, pending d/c today 16. Code Status: Full Code   Jane Broughton R. Paulina Fusi, DO of [facility] 02/08/2012, 8:22 AM

## 2012-02-08 NOTE — Discharge Summary (Signed)
Physician Discharge Summary  Patient ID: Patrick Holt MRN: [id number] DOB: 08-22-1950 Age: 61 y.o.  Admit date: 02/03/2012 Discharge date: 02/08/2012 Admitting Physician: Tobey Grim, MD  PCP: Gaspar Bidding, DO Consultants:Cardiology, Psychiatry      Discharge Diagnosis:  Principal Problem:  *Seizures Active Problems:  DIABETES MELLITUS, TYPE II  HYPERTENSION  Rheumatoid arthritis  SDH (subdural hematoma)  Atrial fibrillation  Coronary atherosclerosis of native coronary artery    Hospital Course Patrick Holt is a 61 y.o. year old male with h/o RA, Depression, HTN, diverticulosis/itis, HLD, COPD, Reflux, Seizures, Fibromyalgia, Dermatomyositis, MIO, raynaud's, hiatal hernia, neprholithiasis, esophageal dysmotility, paroxysmal A-fib, and otosclerosis who presented w/ fall and likely seizure.   Problem List 1. Tonic Clonic Seizure with Fall - PT presented to the ED with a witnessed tonic clonic seizure and also had another witnessed event in the ED.  He was given one dose of Ativan and loaded with Dilantin.  During time of admission, pt had altered mental status and was unable to elaborate on what had happened prior to admission.  W/U in the ED included a CT head which was negative for acute process, a BMP showing a K+ of 3.1 but otherwise normal, a troponin negative x 1, Leukocytosis 13.5 and MCV 106.4, UA >300, and CXR without acute findings along with EKG unchanged from previous, UDS was negative, and EtOH was < 11. Pt was admitted to telemetry, NPO until he passed his swallow screen, placed on seizure precautions, and started on Keppra 500 mg BID IV.  He was also placed on CIWA due to possible alcohol withdrawal being a cause of his seizures but did not require a dose of ativan while in the hospital.  Pt started to develop agitation and there was worry he may have developed a bleed in his head.  Neurology was consulted who recommended a TSH (WNL), a B12 (WNL), and an MRI  which showed tiny possible SAH vs SDH.  Neurology recommended close observation and possible neurosurgery intervention if pt deteriorated.  Pt was monitored closely for worsening mental status or focal neurologic deficits during the rest of his hospital stay.  PT/OT was also consulted during his stay and recommended SNF but pt adamantly denied.   2. 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  His troponins were negative x 3 and was placed on telemetry during his hospital stay.  It was noted that the patient developed some bradycardia and then tachycardia, multiple times, and eventually was found to have Atrial Fibrillation.  A repeat EKG confirmed this but did not show acute ST changes.  Cardiology was consulted and they recommended amiodarone drip due to patient's HR, did not start on coumadin or anticoagulation due to fall risk, and ordered an Echo which showed a preserved EF with possible grade one diastolic CHF.  Pt was continued on amiodarone bolus while in Afib with RVR, eventually converting to NSR. He was switched to oral amiodarone, 400 mg q 8 hrs, to be completed for 7 days.  Then 400 mg BID x 14 days, and 400 mg qd for 14 days, eventually being tapered to 200 mg qd.  He was advised to follow up with Dr. Daleen Squibb in 3-4 weeks at the time of d/c.  No palpations or lightheaded at time of d/c and was stable.  This could have been an inciting event to his fall/seizure on admission.   3.  Subdural hemorrhage/Subarachnoid hemorrhage - Pt CT head on admission was negative for acute bleed/pathology.  Pt continued to have waxing and wanning mental status (unsure of baseline) during his first 24 hrs so neurology was consulted and an MRI and multiple labs were ordered.  An MRI showed small SDH vs SAH and he was observed very closely during the rest of his hospital stay.  No focal neurologic deficits at time of discharge  and AAO x 3.    4. Extensive Psychiatric history - Pt presented to the ED with extreme tangential thinking.  It was unsure at that time whether this was related to a seizure, fall, or head trauma.  Records were not obtained from his previous psychiatrist [facility]) but the provider was not available multiple times and we were told he had been dismissed from the practice.  Inpatient Psychiatry was consulted for further evaluation for capacity and recommendations and stated he had capacity along with continuing his Cymbalta, starting Trazodone 50 mg qhs, and following up with behavior health in an outpatient setting.   5. DM II, controlled  - A1C on admission was 5.5. Placed on SSI sensitive and stable during hospitalization.   6. HTN - Stable, continued home Metoprolol and Zestoretic   7. Dermatomyositis - Stable, continued home prednisone  8. Rheumatoid arthritis - Stable, continued home MTX  9 - R Leg cellulitis - Pt sent home on Doxycycline 100 mg bid for 5 more days (total of 8 days)   10- Tobacco Abuse - Placed pt on Nictoine 21 mg patch.  Stable during hospital stay and given information on cessation   11. Hypokalemia - Pt developed hypokalemia during hospitalization to 2.7.  Replenished with Kdur and corrected to 3.7 prior to d/c.         Discharge PE   Filed Vitals:   02/08/12 1100  BP: 119/81  Pulse: 138  Temp:   Resp:    General: Disheveled  HEENT: MMM, poor dentia,  Cardiovascular: RRR, S1/S2 present, no murmurs appreciated  Respiratory: CTA B/L, no wheezes, no respiratory distress  Abdomen: Soft, NT/ND, NABS  Extremities: + Ulnar deviation of fingers  Skin: LE venous dermatitis, + 2 x 2 cm non-purulent, non-painful healing wound R proximal tibia.  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  No acute  fracture.   Original Report Authenticated By: Janeece Riggers, M.D.    Ct Head Wo Contrast  02/04/2012    IMPRESSION: Motion degraded images.  No interval change from recent CT.   Original Report Authenticated By: Charline Bills, M.D.    Ct Cervical Spine Wo Contrast  02/03/2012  IMPRESSION: Normal alignment and no acute bony findings.   Original Report Authenticated By: Rudie Meyer, M.D.    Mr Brain Wo Contrast  02/04/2012   IMPRESSION:  1.  Tiny bilateral frontal subarachnoid or subdural hemorrhages without significant underlying trauma. 2.  Moderate atrophy white matter disease is stable.  Critical Value/emergent results were called by telephone at the time of interpretation on 02/04/2012 at 08:40 p.m. to [facility], R.N., who verbally acknowledged these results.   Original Report Authenticated By: Marin Roberts, M.D.    Dg Pelvis Portable  02/03/2012 IMPRESSION: No acute bony findings.   Original Report Authenticated By: Rudie Meyer, M.D.    Dg Chest Port 1 View  02/03/2012  IMPRESSION: No acute cardiopulmonary findings and intact bony thorax.   Original Report Authenticated By: Rudie Meyer, M.D.    Echo 02/06/12 Left ventricle: The cavity size was normal. Wall thickness was increased in a pattern of mild LVH. Systolic function was vigorous. The estimated ejection fraction was in the range of 65% to 70%. Wall motion was normal; there were no regional wall motion abnormalities. Doppler parameters are consistent with abnormal left ventricular relaxation (grade 1 diastolic dysfunction).    Labs  CBC  Lab 02/06/12 1103 02/04/12 0600 02/03/12 1819  WBC 11.6* 8.8 9.5  HGB 17.1* 15.0 14.6  HCT 49.6 44.6 44.8  PLT 236 240 255   BMET  Lab 02/06/12 1103 02/04/12 1632 02/04/12 0600 02/03/12 1143  NA 137 140 139 --  K 3.5 3.9 2.7* --  CL 96 102 101 --  CO2 32 26 28 --  BUN 9 5* 4* --  CREATININE 0.89 0.71 0.78 --  CALCIUM 9.5 9.3 8.7 --  PROT -- -- -- 6.7   BILITOT -- -- -- 0.5  ALKPHOS -- -- -- 75  ALT -- -- -- 16  AST -- -- -- 18  GLUCOSE 123* 118* 105* --        Patient condition at time of discharge/disposition: stable  Disposition-home   Follow up issues: 1. F/U with cards for Sick Sinus Syndrome vs Paroxysmal Afib (Dr. Daleen Squibb) On amiodarone taper until then 2. Consider outpatient psychiatry.  Started on Trazodone 50 mg qhs, psych recommends continuing Xanax 3. Pt found to have SAH on MRI.  May need f/u with Neuro along with evaluation for seizures.  4. Will need PFT's/Sleep study in outpatient setting   Discharge follow up:  Follow-up Information    Follow up with Valera Castle, MD. Schedule an appointment as soon as possible for a visit in 1 week.   Contact information:   [address] [place] [address] [phone]       Follow up with RIGBY, MICHAEL, DO. (02/21/12 @ 9:30 AM)    Contact information:   [address] [place] [address] [phone]       Call [facility]. (To schedule intake appointment for mental health treatment)    Contact information:   [address] [place], [place] [address] [phone]        Discharge Orders    Future Appointments: Provider: Department: Dept Phone: Center:   02/21/2012 9:30 AM Andrena Mews, DO [facility] [phone] [facility]     Future Orders Please Complete By Expires   Increase activity slowly      Call MD for:  redness, tenderness, or signs of infection (pain, swelling, redness, odor or green/yellow discharge around incision site)      Call MD for:  difficulty breathing, headache or visual disturbances      Call MD for:  persistant dizziness or light-headedness          Discharge Instructions: Please refer to Patient Instructions section of EMR for full details.  Patient was counseled important signs and symptoms that should prompt return to medical care, changes in medications, dietary  instructions, activity restrictions, and follow up appointments.  Significant instructions noted below:    Discharge Medications   Medication List     As of 02/10/2012 10:21 AM    Errors       There were errors while generating this report. This may mean that the  report is inaccurate, possibly including missing medications. Review the chart to verify that the report is correct and complete, and annotate the report with any corrections. Also, notify your system administrator to have the problem fixed.       START taking these medications         * amiodarone 400 MG tablet   Commonly known as: PACERONE   Take 1 tablet (400 mg total) by mouth every 8 (eight) hours.      * amiodarone 200 MG tablet   Commonly known as: PACERONE   Take 2 tablets (400 mg total) by mouth 2 (two) times daily.   Start taking on: 02/16/2012      * amiodarone 200 MG tablet   Commonly known as: PACERONE   Take 2 tablets (400 mg total) by mouth daily.   Start taking on: 03/02/2012      * amiodarone 200 MG tablet   Commonly known as: PACERONE   Take 1 tablet (200 mg total) by mouth daily.   Start taking on: 03/17/2012      doxycycline 100 MG tablet   Commonly known as: VIBRA-TABS   Take 1 tablet (100 mg total) by mouth every 12 (twelve) hours.      levETIRAcetam 500 MG tablet   Commonly known as: KEPPRA   Take 1 tablet (500 mg total) by mouth 2 (two) times daily.      traZODone 50 MG tablet   Commonly known as: DESYREL   Take 1 tablet (50 mg total) by mouth at bedtime.     * Notice: This list has 4 medication(s) that are the same as other medications prescribed for you. Read the directions carefully, and ask your doctor or other care provider to review them with you.    CONTINUE taking these medications         ALPRAZolam 1 MG tablet   Commonly known as: XANAX      ASPIR-81 81 MG EC tablet   Generic drug: aspirin      COMBIVENT 18-103 MCG/ACT inhaler   Generic drug: albuterol-ipratropium       diclofenac 75 MG EC tablet   Commonly known as: VOLTAREN   Take 1 tablet (75 mg total) by mouth 2 (two) times daily as needed. For back pain      dicyclomine 20 MG tablet   Commonly known as: BENTYL   Take 1 tablet (20 mg total) by mouth 2 (two) times daily.      diphenoxylate-atropine 2.5-0.025 MG per tablet   Commonly known as: LOMOTIL   Take 1 tablet by mouth 4 (four) times daily as needed for diarrhea or loose stools.      DULoxetine 60 MG capsule   Commonly known as: CYMBALTA      esomeprazole 40 MG capsule   Commonly known as: NEXIUM   Take 1 capsule (40 mg total) by mouth daily before breakfast.      folic acid 1 MG tablet   Commonly known as: FOLVITE      glipiZIDE 5 MG 24 hr tablet   Commonly known as: GLUCOTROL XL      lisinopril-hydrochlorothiazide 10-12.5 MG per tablet   Commonly known as: PRINZIDE,ZESTORETIC   Take 1 tablet by mouth daily.      magnesium oxide 400 MG tablet   Commonly known as: MAG-OX      methotrexate 2.5 MG tablet   Commonly known as: RHEUMATREX   Take 3 tablets (7.5 mg total) by mouth once  a week.      metoprolol 50 MG tablet   Commonly known as: LOPRESSOR   Take 1 tablet (50 mg total) by mouth 2 (two) times daily.      niacin 1000 MG CR tablet   Commonly known as: NIASPAN      nitroGLYCERIN 0.4 MG SL tablet   Commonly known as: NITROSTAT      oxybutynin 5 MG tablet   Commonly known as: DITROPAN   Take 1 tablet (5 mg total) by mouth 3 (three) times daily.      potassium chloride SA 20 MEQ tablet   Commonly known as: K-DUR,KLOR-CON   Take 1 tablet (20 mEq total) by mouth daily.      predniSONE 5 MG tablet   Commonly known as: DELTASONE   Take 1 tablet (5 mg total) by mouth daily.      rOPINIRole 2 MG tablet   Commonly known as: REQUIP      thiamine 100 MG tablet   Commonly known as: VITAMIN B-1      torsemide 10 MG tablet   Commonly known as: DEMADEX   Take 1 tablet (10 mg total) by mouth daily.      STOP taking  these medications         gabapentin 600 MG tablet   Commonly known as: NEURONTIN          Where to get your medications    These are the prescriptions that you need to pick up. We sent them to a specific pharmacy, so you will need to go there to get them.   [facility] - [place], [place] - [address]    [address] [place] [address]    Phone: [phone]        amiodarone 200 MG tablet   amiodarone 400 MG tablet   doxycycline 100 MG tablet   levETIRAcetam 500 MG tablet   traZODone 50 MG tablet                Gildardo Cranker, DO of [facility] 02/10/2012 10:11 AM

## 2012-02-08 NOTE — Progress Notes (Signed)
Physical Therapy Treatment Patient Details Name: Patrick Holt MRN: [id number] DOB: Aug 07, 1950 Today's Date: 02/08/2012 Time: 0730-0806 PT Time Calculation (min): 36 min  PT Assessment / Plan / Recommendation Comments on Treatment Session  O2 dropped last night while sleeping and pt very focused on this today. Ambulated pt on RA and he maintained SaO2 90% or above with only one dip to 89% and pt able to recover with cues for pursed lip/deep breathing. Pt refusing SNF. Rec HHPT/OT and HH safety eval for d/c.     Follow Up Recommendations  Home health PT;Other (comment);Supervision for mobility/OO[facility] safety eval)     Does the patient have the potential to tolerate intense rehabilitation     Barriers to Discharge        Equipment Recommendations  None recommended by PT    Recommendations for Other Services    Frequency Min 3X/week   Plan Discharge plan needs to be updated    Precautions / Restrictions Precautions Precautions: Fall Restrictions Weight Bearing Restrictions: No   Pertinent Vitals/Pain SaO2 remained 90% or above during ambulation on RA (one dip to 89% but pt recovered quickly with instruction for breathing vs talking while walking)    Mobility  Bed Mobility Bed Mobility: Not assessed Transfers Transfers: Sit to Stand;Stand to Sit Sit to Stand: 5: Supervision;From chair/3-in-1;With upper extremity assist;With armrests;From bed Stand to Sit: 5: Supervision;To chair/3-in-1;With upper extremity assist;With armrests;To bed Details for Transfer Assistance: verbal cues for hand placement, needing 1-2 reminders during session with good carry over at end of session Ambulation/Gait Ambulation/Gait Assistance: 5: Supervision;4: Min guard Ambulation Distance (Feet): 360 Feet (180 x2) Assistive device: Rolling walker Ambulation/Gait Assistance Details: cues for tall posture and safe positioning with RW especially when backing up to sit Gait Pattern: Trunk  flexed;Decreased stride length      PT Goals Acute Rehab PT Goals PT Goal: Sit to Stand - Progress: Progressing toward goal PT Goal: Stand to Sit - Progress: Progressing toward goal PT Goal: Ambulate - Progress: Progressing toward goal  Visit Information  Last PT Received On: 02/08/12 Assistance Needed: +1    Subjective Data  Subjective: 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    Cognition  Overall Cognitive Status: Impaired Area of Impairment: Attention Arousal/Alertness: Awake/alert Orientation Level: Appears intact for tasks assessed Behavior During Session: [facility] for tasks performed Current Attention Level: Sustained;Selective Attention - Other Comments: attention sustained for minutes at a time on concerns about oxygen overnight Safety/Judgement: Decreased awareness of need for assistance Awareness of Deficits: planning to go home regardless of safety issues Cognition - Other Comments: off topic, needs redirective cues throughout    Balance  Static Standing Balance Static Standing - Balance Support: No upper extremity supported Static Standing - Level of Assistance: 5: Stand by assistance Static Standing - Comment/# of Minutes: increased AP sway needing unilateral upper extremity support for stability  End of Session PT - End of Session Equipment Utilized During Treatment: Gait belt Activity Tolerance: Patient tolerated treatment well Patient left: in chair;with call bell/phone within reach Nurse Communication: Mobility status   GP     [facility] [address] HELEN 02/08/2012, 8:44 AM

## 2012-02-08 NOTE — Progress Notes (Signed)
Oxygen level dropped to 83% on RA while asleep.Oxygen at 2lnc placed ans saturations gradually increased to 96%.Will continue to monitor. me

## 2012-02-08 NOTE — Progress Notes (Signed)
Pt had 12 beats VT. VSS. Pt asymptomatic. Notified Dr. Kateri Plummer of VT. Orders still Ok for discharge. Barbera Setters

## 2012-02-08 NOTE — Progress Notes (Addendum)
SUBJECTIVE: The patient is doing well today.  He has occasional decrease in sats while asleep.  At this time, he denies chest pain, shortness of breath, or any new concerns.     Marland Kitchen amiodarone  400 mg Oral Q8H  . aspirin EC  81 mg Oral Daily  . collagenase   Topical Daily  . docusate sodium  100 mg Oral BID  . doxycycline  100 mg Oral Q12H  . folic acid  1 mg Oral Daily  . lisinopril  10 mg Oral Daily   And  . hydrochlorothiazide  12.5 mg Oral Daily  . insulin aspart  0-9 Units Subcutaneous TID WC  . levETIRAcetam  500 mg Oral BID  . magnesium oxide  400 mg Oral Daily  . methotrexate  7.5 mg Oral Weekly  . niacin  1,000 mg Oral QHS  . nicotine  21 mg Transdermal Daily  . oxybutynin  5 mg Oral TID  . potassium chloride SA  20 mEq Oral Daily  . predniSONE  5 mg Oral QAC breakfast  . sodium chloride  3 mL Intravenous Q12H  . thiamine  100 mg Oral Daily  . torsemide  10 mg Oral Daily      OBJECTIVE: Physical Exam: Filed Vitals:   02/08/12 0000 02/08/12 0200 02/08/12 0324 02/08/12 0745  BP: 113/62 100/63 102/67   Pulse: 77 79 92   Temp:   98.8 F (37.1 C) 98.1 F (36.7 C)  TempSrc:   Oral Oral  Resp:   16 18  Height:      Weight:      SpO2: 93% 90% 91%     Intake/Output Summary (Last 24 hours) at 02/08/12 0759 Last data filed at 02/08/12 0300  Gross per 24 hour  Intake 426.63 ml  Output    875 ml  Net -448.37 ml    Telemetry reveals sinus rhythm, atrial flutter overnight now back in sinus  GEN- The patient is well appearing, alert  Head- normocephalic, atraumatic Eyes-  Sclera clear, conjunctiva pink Ears- hearing intact Oropharynx- clear Neck- supple,  Lungs- Clear to ausculation bilaterally, normal work of breathing Heart- Regular rate and rhythm, no murmurs, rubs or gallops, PMI not laterally displaced GI- soft, NT, ND, + BS Extremities- no clubbing, cyanosis, or edema Skin- no rash or lesion Psych- flat affect  LABS: Basic Metabolic  Panel:  Basename 02/06/12 1103  NA 137  K 3.5  CL 96  CO2 32  GLUCOSE 123*  BUN 9  CREATININE 0.89  CALCIUM 9.5  MG --  PHOS --   Liver Function Tests: No results found for this basename: AST:2,ALT:2,ALKPHOS:2,BILITOT:2,PROT:2,ALBUMIN:2 in the last 72 hours No results found for this basename: LIPASE:2,AMYLASE:2 in the last 72 hours CBC:  Basename 02/06/12 1103  WBC 11.6*  NEUTROABS --  HGB 17.1*  HCT 49.6  MCV 104.2*  PLT 236   ASSESSMENT AND PLAN:  Active Problems:  SDH (subdural hematoma)  Atrial fibrillation  Coronary atherosclerosis of native coronary artery  1. Paroxysmal atrial flutter and atrial fibrillation  2. Traumatic SDH/SAH  3. CAD  4. Seizures  5. DM2   Appears to be doing well with amiodarone Continue amiodarone 400mg  Q8 hours x 7 days, then 400mg  BID x 14 days, then 400mg  daily x 14 days, then 200mg  daily No further inpatient cardiology eval planned Follow-up with Dr Daleen Squibb in 3-4 weeks Not candidate for anti-coagulation due to SAH/SDH and ETOH  Given nocturnal hypoxia, would recommend outpatient sleep study Smoking  cessation strongly advised  We will see as needed while here Transfer to telemetry today   Hillis Range, MD 02/08/2012 7:59 AM

## 2012-02-08 NOTE — Progress Notes (Signed)
Seen and examined.  Discussed with Dr. Paulina Fusi.  OK to DC to home today.  I am concerned that he lives alone and is physically and emotionally frail.  Psych deems he has capacity and patient wants home.  We have reached point of max hospital benefit.

## 2012-02-09 ENCOUNTER — Emergency Department (HOSPITAL_COMMUNITY): Payer: Medicare HMO

## 2012-02-09 ENCOUNTER — Encounter (HOSPITAL_COMMUNITY): Payer: Self-pay | Admitting: *Deleted

## 2012-02-09 ENCOUNTER — Emergency Department (HOSPITAL_COMMUNITY)
Admission: EM | Admit: 2012-02-09 | Discharge: 2012-02-09 | Disposition: A | Discharge status: Home / Self Care | Payer: Medicare HMO | Attending: Emergency Medicine | Admitting: Emergency Medicine

## 2012-02-09 DIAGNOSIS — Z8719 Personal history of other diseases of the digestive system: Secondary | ICD-10-CM | POA: Insufficient documentation

## 2012-02-09 DIAGNOSIS — I10 Essential (primary) hypertension: Secondary | ICD-10-CM | POA: Insufficient documentation

## 2012-02-09 DIAGNOSIS — Z8659 Personal history of other mental and behavioral disorders: Secondary | ICD-10-CM | POA: Insufficient documentation

## 2012-02-09 DIAGNOSIS — I4891 Unspecified atrial fibrillation: Secondary | ICD-10-CM | POA: Insufficient documentation

## 2012-02-09 DIAGNOSIS — J449 Chronic obstructive pulmonary disease, unspecified: Secondary | ICD-10-CM | POA: Insufficient documentation

## 2012-02-09 DIAGNOSIS — Z8679 Personal history of other diseases of the circulatory system: Secondary | ICD-10-CM | POA: Insufficient documentation

## 2012-02-09 DIAGNOSIS — R569 Unspecified convulsions: Secondary | ICD-10-CM

## 2012-02-09 DIAGNOSIS — Z87442 Personal history of urinary calculi: Secondary | ICD-10-CM | POA: Insufficient documentation

## 2012-02-09 DIAGNOSIS — Z79899 Other long term (current) drug therapy: Secondary | ICD-10-CM | POA: Insufficient documentation

## 2012-02-09 DIAGNOSIS — Z860100 Personal history of colon polyps, unspecified: Secondary | ICD-10-CM | POA: Insufficient documentation

## 2012-02-09 DIAGNOSIS — F17200 Nicotine dependence, unspecified, uncomplicated: Secondary | ICD-10-CM | POA: Insufficient documentation

## 2012-02-09 DIAGNOSIS — Z8619 Personal history of other infectious and parasitic diseases: Secondary | ICD-10-CM | POA: Insufficient documentation

## 2012-02-09 DIAGNOSIS — G40909 Epilepsy, unspecified, not intractable, without status epilepticus: Secondary | ICD-10-CM | POA: Insufficient documentation

## 2012-02-09 DIAGNOSIS — J4489 Other specified chronic obstructive pulmonary disease: Secondary | ICD-10-CM | POA: Insufficient documentation

## 2012-02-09 DIAGNOSIS — Z8669 Personal history of other diseases of the nervous system and sense organs: Secondary | ICD-10-CM | POA: Insufficient documentation

## 2012-02-09 DIAGNOSIS — M069 Rheumatoid arthritis, unspecified: Secondary | ICD-10-CM | POA: Insufficient documentation

## 2012-02-09 DIAGNOSIS — E785 Hyperlipidemia, unspecified: Secondary | ICD-10-CM | POA: Insufficient documentation

## 2012-02-09 DIAGNOSIS — K219 Gastro-esophageal reflux disease without esophagitis: Secondary | ICD-10-CM | POA: Insufficient documentation

## 2012-02-09 DIAGNOSIS — I480 Paroxysmal atrial fibrillation: Secondary | ICD-10-CM

## 2012-02-09 DIAGNOSIS — Z87448 Personal history of other diseases of urinary system: Secondary | ICD-10-CM | POA: Insufficient documentation

## 2012-02-09 DIAGNOSIS — Z85038 Personal history of other malignant neoplasm of large intestine: Secondary | ICD-10-CM | POA: Insufficient documentation

## 2012-02-09 DIAGNOSIS — F411 Generalized anxiety disorder: Secondary | ICD-10-CM | POA: Insufficient documentation

## 2012-02-09 DIAGNOSIS — E119 Type 2 diabetes mellitus without complications: Secondary | ICD-10-CM | POA: Insufficient documentation

## 2012-02-09 DIAGNOSIS — Z7982 Long term (current) use of aspirin: Secondary | ICD-10-CM | POA: Insufficient documentation

## 2012-02-09 DIAGNOSIS — Z862 Personal history of diseases of the blood and blood-forming organs and certain disorders involving the immune mechanism: Secondary | ICD-10-CM | POA: Insufficient documentation

## 2012-02-09 DIAGNOSIS — E669 Obesity, unspecified: Secondary | ICD-10-CM | POA: Insufficient documentation

## 2012-02-09 DIAGNOSIS — I252 Old myocardial infarction: Secondary | ICD-10-CM | POA: Insufficient documentation

## 2012-02-09 LAB — CBC WITH DIFFERENTIAL/PLATELET
Basophils Absolute: 0 10*3/uL (ref 0.0–0.1)
Basophils Relative: 0 % (ref 0–1)
Eosinophils Absolute: 0 10*3/uL (ref 0.0–0.7)
Eosinophils Relative: 0 % (ref 0–5)
HCT: 46.2 % (ref 39.0–52.0)
MCH: 34.8 pg — ABNORMAL HIGH (ref 26.0–34.0)
MCHC: 33.8 g/dL (ref 30.0–36.0)
Monocytes Absolute: 1 10*3/uL (ref 0.1–1.0)
Neutro Abs: 7.6 10*3/uL (ref 1.7–7.7)
RDW: 13.6 % (ref 11.5–15.5)

## 2012-02-09 LAB — COMPREHENSIVE METABOLIC PANEL
AST: 25 U/L (ref 0–37)
Albumin: 3.4 g/dL — ABNORMAL LOW (ref 3.5–5.2)
Calcium: 9.7 mg/dL (ref 8.4–10.5)
Chloride: 95 mEq/L — ABNORMAL LOW (ref 96–112)
Creatinine, Ser: 1.52 mg/dL — ABNORMAL HIGH (ref 0.50–1.35)
Total Protein: 7.1 g/dL (ref 6.0–8.3)

## 2012-02-09 LAB — TROPONIN I: Troponin I: <0.3 ng/mL (ref <0.30)

## 2012-02-09 MED ORDER — LEVETIRACETAM 500 MG PO TABS
500.0000 mg | ORAL_TABLET | Freq: Once | ORAL | Status: AC
  Administered 2012-02-09: 500 mg via ORAL
  Filled 2012-02-09: qty 1

## 2012-02-09 MED ORDER — LORAZEPAM 2 MG/ML IJ SOLN: 0.5000 mg | Freq: Once | INTRAMUSCULAR | Status: DC

## 2012-02-09 MED ORDER — LEVETIRACETAM 500 MG PO TABS: 500.0000 mg | ORAL_TABLET | Freq: Once | ORAL | Status: DC

## 2012-02-09 MED ORDER — DILTIAZEM HCL 25 MG/5ML IV SOLN
10.0000 mg | Freq: Once | INTRAVENOUS | Status: DC
  Filled 2012-02-09: qty 5

## 2012-02-09 MED ORDER — ONDANSETRON HCL 4 MG/2ML IJ SOLN: 4.0000 mg | Freq: Three times a day (TID) | INTRAMUSCULAR | Status: DC | PRN

## 2012-02-09 MED ORDER — AMIODARONE HCL 200 MG PO TABS
400.0000 mg | ORAL_TABLET | Freq: Every day | ORAL | Status: DC
  Administered 2012-02-09: 400 mg via ORAL
  Filled 2012-02-09: qty 2

## 2012-02-09 MED ORDER — AMIODARONE HCL 200 MG PO TABS
800.0000 mg | ORAL_TABLET | Freq: Once | ORAL | Status: DC
  Filled 2012-02-09: qty 4

## 2012-02-09 NOTE — ED Notes (Signed)
MD at bedside. 

## 2012-02-09 NOTE — ED Notes (Signed)
Patient arrived via EMS.  Was discharged from this facility at 2pm 12/24.  Stated when he was discharged they told him to call EMS if he started having any chest pain, difficulty breathing, feeling different anyway.  Tonight he started feeling funny in his chest.  Denies SOB, diaphoresis, N/V.  hile talking with the patient, noticWed how he talked slowly and would

## 2012-02-09 NOTE — ED Provider Notes (Signed)
History     CSN: [id number]  Arrival date & time 02/09/12  0031   First MD Initiated Contact with Patient 02/09/12 0057      Chief Complaint  Patient presents with  . Palpitations    (Consider location/radiation/quality/duration/timing/severity/associated sxs/prior treatment) HPI Pt d/c home yesterday after several day admission for ICH and Af with RVR on amiodarone. Pt went home and states from 7-9 pm had chest fluttering and the sensation of his heart speeding up and slowing down. Currently pt has no palpations, CP, SOB.  Past Medical History  Diagnosis Date  . Rheumatoid arthritis   . Obesity   . Major depression     with acute psychotic break in 06/2010  . Diabetes mellitus   . Hypertension   . Hyperlipidemia   . Diverticulosis of colon   . COPD (chronic obstructive pulmonary disease)   . Anxiety   . GERD (gastroesophageal reflux disease)   . Vertigo   . Seizures   . Fibromyalgia   . Dermatomyositis   . Myocardial infarct     mulitple (1999, 2000, 2004)  . Raynaud's disease   . Narcotic dependence   . Peripheral neuropathy   . Internal hemorrhoids   . Ischemic heart disease   . Hiatal hernia   . Gastritis   . Diverticulitis   . Hx of adenomatous colonic polyps   . Nephrolithiasis   . Anemia   . Esophageal stricture   . Esophageal dysmotility   . Dermatomyositis   . Paroxysmal a-fib   . Urge incontinence   . Otosclerosis     Past Surgical History  Procedure Date  . Knee arthroscopy w/ meniscal repair 2009    left  . Lumbar disc surgery   . Squamous papilloma  2010    removed by Dr. Pollyann Kennedy ENT, noted on tongue    Family History  Problem Relation Age of Onset  . Alcohol abuse Mother   . Depression Mother   . Heart disease Mother   . Diabetes Mother   . Stroke Mother   . Diabetes Other     1/2 brother    History  Substance Use Topics  . Smoking status: Current Every Day Smoker -- 0.5 packs/day  . Smokeless tobacco: Not on file  . Alcohol  Use: 0.6 oz/week    1 Glasses of wine per week     Comment: daily      Review of Systems  Constitutional: Negative for fever and chills.  HENT: Negative for neck pain.   Respiratory: Negative for shortness of breath.   Cardiovascular: Positive for palpitations. Negative for chest pain and leg swelling.  Gastrointestinal: Negative for nausea, vomiting and abdominal pain.  Musculoskeletal: Negative for back pain.  Skin: Negative for rash and wound.  Neurological: Negative for dizziness, weakness, numbness and headaches.  All other systems reviewed and are negative.    Allergies  Betamethasone dipropionate; Bupropion hcl; Ciprofloxacin; Clobetasol; Codeine; Escitalopram oxalate; Fluoxetine hcl; Fluticasone-salmeterol; Furosemide; Immune globulins; Metronidazole; Paroxetine; Penicillins; Statins; Tacrolimus; Tetanus toxoid; and Tuberculin purified protein derivative  Home Medications   Current Outpatient Rx  Name  Route  Sig  Dispense  Refill  . IPRATROPIUM-ALBUTEROL 18-103 MCG/ACT IN AERO   Inhalation   Inhale 2 puffs into the lungs every 4 (four) hours as needed. For shortness of breath/wheeze          . ALPRAZOLAM 1 MG PO TABS   Oral   Take 1 mg by mouth 3 (three) times daily as  needed. For anxiety         . AMIODARONE HCL 400 MG PO TABS   Oral   Take 1 tablet (400 mg total) by mouth every 8 (eight) hours.   21 tablet   0   . ASPIRIN 81 MG PO TBEC   Oral   Take 81 mg by mouth daily.           Marland Kitchen DICLOFENAC SODIUM 75 MG PO TBEC   Oral   Take 1 tablet (75 mg total) by mouth 2 (two) times daily as needed. For back pain   60 tablet   3   . DICYCLOMINE HCL 20 MG PO TABS   Oral   Take 1 tablet (20 mg total) by mouth 2 (two) times daily.   60 tablet   1   . DIPHENOXYLATE-ATROPINE 2.5-0.025 MG PO TABS   Oral   Take 1 tablet by mouth 4 (four) times daily as needed for diarrhea or loose stools.   90 tablet   5   . DOXYCYCLINE HYCLATE 100 MG PO TABS   Oral    Take 1 tablet (100 mg total) by mouth every 12 (twelve) hours.   12 tablet   0   . DULOXETINE HCL 60 MG PO CPEP   Oral   Take 60 mg by mouth 2 (two) times daily.          Marland Kitchen ESOMEPRAZOLE MAGNESIUM 40 MG PO CPDR   Oral   Take 1 capsule (40 mg total) by mouth daily before breakfast.   30 capsule   1   . FOLIC ACID 1 MG PO TABS   Oral   Take 1 mg by mouth as directed. One tab by mouth on days not taking methotrexate          . GLIPIZIDE ER 5 MG PO TB24   Oral   Take 5 mg by mouth daily.         Marland Kitchen LEVETIRACETAM 500 MG PO TABS   Oral   Take 1 tablet (500 mg total) by mouth 2 (two) times daily.   60 tablet   3   . LISINOPRIL-HYDROCHLOROTHIAZIDE 10-12.5 MG PO TABS   Oral   Take 1 tablet by mouth daily.   30 tablet   11   . MAGNESIUM OXIDE 400 MG PO TABS   Oral   Take 400 mg by mouth daily.         Marland Kitchen METHOTREXATE 2.5 MG PO TABS   Oral   Take 3 tablets (7.5 mg total) by mouth once a week.   15 tablet   0   . METOPROLOL TARTRATE 50 MG PO TABS   Oral   Take 1 tablet (50 mg total) by mouth 2 (two) times daily.   60 tablet   3   . NIACIN ER (ANTIHYPERLIPIDEMIC) 1000 MG PO TBCR   Oral   Take 1,000 mg by mouth at bedtime.         Marland Kitchen NITROGLYCERIN 0.4 MG SL SUBL   Sublingual   Place 0.4 mg under the tongue every 5 (five) minutes as needed. For chest pain         . OXYBUTYNIN CHLORIDE 5 MG PO TABS   Oral   Take 1 tablet (5 mg total) by mouth 3 (three) times daily.   90 tablet   0   . POTASSIUM CHLORIDE CRYS ER 20 MEQ PO TBCR   Oral   Take 1 tablet (20 mEq total)  by mouth daily.   30 tablet   0     Make office visit within one month for check.   Marland Kitchen PREDNISONE 5 MG PO TABS   Oral   Take 1 tablet (5 mg total) by mouth daily.   30 tablet   5   . ROPINIROLE HCL 2 MG PO TABS   Oral   Take 2 mg by mouth 3 (three) times daily as needed. For tremors         . VITAMIN B-1 100 MG PO TABS   Oral   Take 100 mg by mouth daily.         . TORSEMIDE 10  MG PO TABS   Oral   Take 1 tablet (10 mg total) by mouth daily.   30 tablet   0     Needs doctor visit prior to refills   . TRAZODONE HCL 50 MG PO TABS   Oral   Take 1 tablet (50 mg total) by mouth at bedtime.   30 tablet   3   . AMIODARONE HCL 200 MG PO TABS   Oral   Take 2 tablets (400 mg total) by mouth 2 (two) times daily.   28 tablet   0   . AMIODARONE HCL 200 MG PO TABS   Oral   Take 2 tablets (400 mg total) by mouth daily.   14 tablet   0   . AMIODARONE HCL 200 MG PO TABS   Oral   Take 1 tablet (200 mg total) by mouth daily.   30 tablet   3     BP 134/86  Pulse 89  Temp 98.7 F (37.1 C) (Oral)  Resp 20  SpO2 94%  Physical Exam  Nursing note and vitals reviewed. Constitutional: He is oriented to person, place, and time. He appears well-developed and well-nourished. No distress.  HENT:  Head: Normocephalic and atraumatic.  Mouth/Throat: Oropharynx is clear and moist.  Eyes: EOM are normal. Pupils are equal, round, and reactive to light.  Neck: Normal range of motion. Neck supple.  Cardiovascular: Normal rate and regular rhythm.   Pulmonary/Chest: Effort normal and breath sounds normal. No respiratory distress. He has no wheezes. He has no rales.  Abdominal: Soft. Bowel sounds are normal. He exhibits no distension and no mass. There is no tenderness. There is no rebound and no guarding.  Musculoskeletal: Normal range of motion. He exhibits no edema and no tenderness.  Neurological: He is alert and oriented to person, place, and time.       5/5 motor in all ext, sensation intact  Skin: Skin is warm and dry. No rash noted. No erythema.  Psychiatric: He has a normal mood and affect. His behavior is normal.    ED Course  Procedures (including critical care time)  Labs Reviewed  CBC WITH DIFFERENTIAL - Abnormal; Notable for the following:    WBC 10.8 (*)     MCV 103.1 (*)     MCH 34.8 (*)     All other components within normal limits  COMPREHENSIVE  METABOLIC PANEL - Abnormal; Notable for the following:    Potassium 3.0 (*)     Chloride 95 (*)     CO2 36 (*)     Glucose, Bld 121 (*)     BUN 25 (*)     Creatinine, Ser 1.52 (*)     Albumin 3.4 (*)     GFR calc non Af Amer 47 (*)  GFR calc Af Amer 55 (*)     All other components within normal limits  TROPONIN I   Dg Chest 2 View  02/09/2012  *RADIOLOGY REPORT*  Clinical Data: Palpitations.  CHEST - 2 VIEW  Comparison: Chest radiograph performed 02/03/2012  Findings: The lungs are well-aerated.  Bibasilar airspace opacities may reflect atelectasis or possibly pneumonia.  There is no evidence of pleural effusion or pneumothorax.  Nodular density at the left lung base is thought to reflect the overlying rib.  The heart is normal in size; calcification is noted within the aortic arch.  No acute osseous abnormalities are seen.  IMPRESSION: Bibasilar airspace opacities may reflect atelectasis or possibly pneumonia.   Original Report Authenticated By: Tonia Ghent, M.D.      1. Paroxysmal atrial fibrillation   2. Seizure-like activity       Date: 02/09/2012  Rate: 134  Rhythm: atrial fibrillation  QRS Axis: normal  Intervals: normal  ST/T Wave abnormalities: nonspecific T wave changes  Conduction Disutrbances:none  Narrative Interpretation:   Old EKG Reviewed: changes noted   MDM   Pt fluctuating in and out of afib with RVR with rates up to 150. Discussed with Fam Med resident. Will see in ED.   Pt converted to NSR before any meds given. He had brief seizure-like episode consisiting of eye twitching and no verbal response. Discussed with Dr Konrad Dolores who states pt has history of seizure and recently started on Keppra. Suggested giving morning dose of Keppra and Amiodarone and following up as outpatient.      Loren Racer, MD 02/09/12 [phone]

## 2012-02-09 NOTE — ED Notes (Signed)
Called to room by IV nurse, state pt. Stopped in middle of conversation (story), pt was non-verbal, mouth opened and jaw twitching slightly. EDP notified, Pt returned to normal at this time, does not recall event.

## 2012-02-09 NOTE — ED Notes (Signed)
Patient transported to X-ray 

## 2012-02-09 NOTE — Progress Notes (Signed)
Family Medicine Teaching Service Consult Note  Patient name: Patrick Holt Medical record number: [id number] Date of birth: [birth year] Age: 61 y.o. Gender: male    LOS: 0 days   Primary Care Provider: Default, Provider, MD  Pt recently admitted to [facility] service after fall, seizure, and cardiac arrhtymia. Felt palptiations yesterday evening after being discharged adn called EMS. EMS found pt to be in Afib w/ RVR and brought to ED. PT states he took his medications last night after discharge but is unsure of which ones he was supposed to take. Pt denies CP, lightheadedness, HA, syncope, n/v, SOB, los of bowel or bladder function, or seizures. Feels well otherwise.   Objective: Vital signs in last 24 hours: Temp:  [98.1 F (36.7 C)-98.7 F (37.1 C)] 98.7 F (37.1 C) (12/25 0041) Pulse Rate:  [81-138] 87  (12/25 0230) Resp:  [13-21] 21  (12/25 0230) BP: (89-119)/(60-81) 114/78 mmHg (12/25 0230) SpO2:  [92 %-97 %] 97 % (12/25 0230)  Wt Readings from Last 3 Encounters:  02/03/12 232 lb 9.4 oz (105.5 kg)  09/07/11 221 lb (100.245 kg)  08/03/11 237 lb (107.502 kg)     Current Facility-Administered Medications  Medication Dose Route Frequency Provider Last Rate Last Dose  . diltiazem (CARDIZEM) injection 10 mg  10 mg Intravenous Once Loren Racer, MD       Current Outpatient Prescriptions  Medication Sig Dispense Refill  . albuterol-ipratropium (COMBIVENT) 18-103 MCG/ACT inhaler Inhale 2 puffs into the lungs every 4 (four) hours as needed. For shortness of breath/wheeze       . ALPRAZolam (XANAX) 1 MG tablet Take 1 mg by mouth 3 (three) times daily as needed. For anxiety      . amiodarone (PACERONE) 400 MG tablet Take 1 tablet (400 mg total) by mouth every 8 (eight) hours.  21 tablet  0  . aspirin (ASPIR-81) 81 MG EC tablet Take 81 mg by mouth daily.        . diclofenac (VOLTAREN) 75 MG EC tablet Take 1 tablet (75 mg total) by mouth 2 (two) times daily as needed. For back pain  60  tablet  3  . dicyclomine (BENTYL) 20 MG tablet Take 1 tablet (20 mg total) by mouth 2 (two) times daily.  60 tablet  1  . diphenoxylate-atropine (LOMOTIL) 2.5-0.025 MG per tablet Take 1 tablet by mouth 4 (four) times daily as needed for diarrhea or loose stools.  90 tablet  5  . doxycycline (VIBRA-TABS) 100 MG tablet Take 1 tablet (100 mg total) by mouth every 12 (twelve) hours.  12 tablet  0  . DULoxetine (CYMBALTA) 60 MG capsule Take 60 mg by mouth 2 (two) times daily.       Marland Kitchen esomeprazole (NEXIUM) 40 MG capsule Take 1 capsule (40 mg total) by mouth daily before breakfast.  30 capsule  1  . folic acid (FOLVITE) 1 MG tablet Take 1 mg by mouth as directed. One tab by mouth on days not taking methotrexate       . glipiZIDE (GLUCOTROL XL) 5 MG 24 hr tablet Take 5 mg by mouth daily.      Marland Kitchen levETIRAcetam (KEPPRA) 500 MG tablet Take 1 tablet (500 mg total) by mouth 2 (two) times daily.  60 tablet  3  . lisinopril-hydrochlorothiazide (PRINZIDE,ZESTORETIC) 10-12.5 MG per tablet Take 1 tablet by mouth daily.  30 tablet  11  . magnesium oxide (MAG-OX) 400 MG tablet Take 400 mg by mouth daily.      Marland Kitchen  methotrexate (RHEUMATREX) 2.5 MG tablet Take 3 tablets (7.5 mg total) by mouth once a week.  15 tablet  0  . metoprolol (LOPRESSOR) 50 MG tablet Take 1 tablet (50 mg total) by mouth 2 (two) times daily.  60 tablet  3  . niacin (NIASPAN) 1000 MG CR tablet Take 1,000 mg by mouth at bedtime.      . nitroGLYCERIN (NITROSTAT) 0.4 MG SL tablet Place 0.4 mg under the tongue every 5 (five) minutes as needed. For chest pain      . oxybutynin (DITROPAN) 5 MG tablet Take 1 tablet (5 mg total) by mouth 3 (three) times daily.  90 tablet  0  . potassium chloride SA (K-DUR,KLOR-CON) 20 MEQ tablet Take 1 tablet (20 mEq total) by mouth daily.  30 tablet  0  . predniSONE (DELTASONE) 5 MG tablet Take 1 tablet (5 mg total) by mouth daily.  30 tablet  5  . rOPINIRole (REQUIP) 2 MG tablet Take 2 mg by mouth 3 (three) times daily as  needed. For tremors      . thiamine (VITAMIN B-1) 100 MG tablet Take 100 mg by mouth daily.      Marland Kitchen torsemide (DEMADEX) 10 MG tablet Take 1 tablet (10 mg total) by mouth daily.  30 tablet  0  . traZODone (DESYREL) 50 MG tablet Take 1 tablet (50 mg total) by mouth at bedtime.  30 tablet  3  . amiodarone (PACERONE) 200 MG tablet Take 2 tablets (400 mg total) by mouth 2 (two) times daily.  28 tablet  0  . amiodarone (PACERONE) 200 MG tablet Take 2 tablets (400 mg total) by mouth daily.  14 tablet  0  . amiodarone (PACERONE) 200 MG tablet Take 1 tablet (200 mg total) by mouth daily.  30 tablet  3  . [DISCONTINUED] oxybutynin (DITROPAN) 5 MG tablet Take 1 tablet (5 mg total) by mouth 3 (three) times daily.  90 tablet  3     PE: Gen: No Distress, AAOx3 HEENT:mmm EOMI CV: Irregularly irregular Res: normal effort Abd: non-distended, non painful to palpation Neuro:CN grossly intact, Cerebellar fxn normal  Labs/Studies:  Results for orders placed during the hospital encounter of 02/09/12 (from the past 24 hour(s))  CBC WITH DIFFERENTIAL     Status: Abnormal   Collection Time   02/09/12  2:43 AM      Component Value Range   WBC 10.8 (*) 4.0 - 10.5 K/uL   RBC 4.48  4.22 - 5.81 MIL/uL   Hemoglobin 15.6  13.0 - 17.0 g/dL   HCT 16.1  09.6 - 04.5 %   MCV 103.1 (*) 78.0 - 100.0 fL   MCH 34.8 (*) 26.0 - 34.0 pg   MCHC 33.8  30.0 - 36.0 g/dL   RDW 40.9  81.1 - 91.4 %   Platelets 241  150 - 400 K/uL   Neutrophils Relative 70  43 - 77 %   Neutro Abs 7.6  1.7 - 7.7 K/uL   Lymphocytes Relative 20  12 - 46 %   Lymphs Abs 2.2  0.7 - 4.0 K/uL   Monocytes Relative 9  3 - 12 %   Monocytes Absolute 1.0  0.1 - 1.0 K/uL   Eosinophils Relative 0  0 - 5 %   Eosinophils Absolute 0.0  0.0 - 0.7 K/uL   Basophils Relative 0  0 - 1 %   Basophils Absolute 0.0  0.0 - 0.1 K/uL  COMPREHENSIVE METABOLIC PANEL  Status: Abnormal   Collection Time   02/09/12  2:43 AM      Component Value Range   Sodium 141  135  - 145 mEq/L   Potassium 3.0 (*) 3.5 - 5.1 mEq/L   Chloride 95 (*) 96 - 112 mEq/L   CO2 36 (*) 19 - 32 mEq/L   Glucose, Bld 121 (*) 70 - 99 mg/dL   BUN 25 (*) 6 - 23 mg/dL   Creatinine, Ser 1.61 (*) 0.50 - 1.35 mg/dL   Calcium 9.7  8.4 - 09.6 mg/dL   Total Protein 7.1  6.0 - 8.3 g/dL   Albumin 3.4 (*) 3.5 - 5.2 g/dL   AST 25  0 - 37 U/L   ALT 22  0 - 53 U/L   Alkaline Phosphatase 70  39 - 117 U/L   Total Bilirubin 0.8  0.3 - 1.2 mg/dL   GFR calc non Af Amer 47 (*) >90 mL/min   GFR calc Af Amer 55 (*) >90 mL/min  TROPONIN I     Status: Normal   Collection Time   02/09/12  2:43 AM      Component Value Range   Troponin I <0.30  <0.30 ng/mL        Assessment/Plan:  Mr. Vallone is a 61yo male w/ h/o RA, depression, HTN, HLD, COPD, Reflux Szrs, Firbomyalgia, Dermatomyositis, Raynauds, hiatal hernia, paroxysmal A-fib recently discharged from the hospital presenting w/ conintued palpitations  1. Palpitations: Pt is in and out of NSR while in ED. Cards eval yesterday there was no further workup needed for the pt and pt was to go home on Amiodarone taper (400mg  Q8 x 7 days then 400mg  BID x14 days then 400mg  Qday x14 days then 200mg  Qday, w/ F./u w/ Dr. Daleen Squibb in 3-4wks. No anticoagulation required due to SAH/SDH noted on previous hospitalization. - Recommend continued dosing of dilt or Amio for rate control in ED w/ DC and careful pt education on medication regimen again.  - Amiodarone may be better option due to pts low baseline BPs.  - Pt should f/u in FM clinic on Thursday and call the clinic 24/7 if has concerns - If pt unstable and unable to get controlled would recommend admission. For further titration of medications  2. Psych: AAOx3 today which is improved from previous admission. Pt evaluated by Dr. Ferol Luz of Psychiatry. Pt to f/u w/ [facility]. Social concerns contributing to current mental state.   3. Seizures: No further seizures since discharge.  - continue Keppra   LOS  0  Signed: Shelly Flatten, MD Family Medicine Resident PGY-2 [phone] 02/09/2012 4:35 AM

## 2012-02-09 NOTE — ED Notes (Signed)
Patient c/o a funny feeling in his chest.  Found to be hypotensive, HR 130 Afib

## 2012-02-09 NOTE — ED Notes (Signed)
Red area on left arm from an IV per the patient.  Area not warm to touch.

## 2012-02-09 NOTE — ED Notes (Signed)
Friend called to come pick up patient; will be here in a little bit.

## 2012-02-10 ENCOUNTER — Telehealth: Payer: Self-pay | Admitting: Physician Assistant

## 2012-02-10 ENCOUNTER — Telehealth: Payer: Self-pay | Admitting: Family Medicine

## 2012-02-10 DIAGNOSIS — R569 Unspecified convulsions: Secondary | ICD-10-CM | POA: Diagnosis present

## 2012-02-10 LAB — CULTURE, BLOOD (ROUTINE X 2): Culture: NO GROWTH

## 2012-02-10 NOTE — Telephone Encounter (Signed)
Emergency Line Call:  Patient calls stating his discharge instructions stated to call "[facility]" but the PA could not make the appointment? I informed the patient that we do not make appointments on the emergency line, but I did let the patient know that he has a previously scheduled appointment with [name] on January 6. Patient then asked me to make an appointment for his heart doctor, I again informed him that I do not schedule appointments. No emergency or other questions at this time.  Jaelani Posa M. Doniven Vanpatten, M.D. 02/10/2012 6:40 PM

## 2012-02-10 NOTE — Discharge Summary (Signed)
Seen and examined on the day of DC.  Agree with Dr. Paulina Fusi' documentation and management.

## 2012-02-10 NOTE — Telephone Encounter (Signed)
Pt called this evening to state he was discharged from the hospital recently and needed to make a f/u appt with Dr. Daleen Squibb. I informed him that the office is closed after 5pm but told him I would leave a message with our scheduling dept which I did. He also requested the family medicine service call him back but did not have a pen to take down their number. He asked that I call the person on-call to call him back. I called the FP resident with this request who acknowledged it. They will be calling him back. Iram Lundberg PA-C

## 2012-02-12 ENCOUNTER — Encounter (HOSPITAL_COMMUNITY): Payer: Self-pay | Admitting: Emergency Medicine

## 2012-02-12 ENCOUNTER — Emergency Department (HOSPITAL_COMMUNITY): Payer: Medicare HMO

## 2012-02-12 ENCOUNTER — Inpatient Hospital Stay (HOSPITAL_COMMUNITY)
Admission: EM | Admit: 2012-02-12 | Discharge: 2012-02-21 | Disposition: A | Discharge status: Home / Self Care | Payer: Medicare HMO | Source: Home / Self Care | Attending: Family Medicine | Admitting: Family Medicine

## 2012-02-12 DIAGNOSIS — M5410 Radiculopathy, site unspecified: Secondary | ICD-10-CM

## 2012-02-12 DIAGNOSIS — K648 Other hemorrhoids: Secondary | ICD-10-CM

## 2012-02-12 DIAGNOSIS — K449 Diaphragmatic hernia without obstruction or gangrene: Secondary | ICD-10-CM

## 2012-02-12 DIAGNOSIS — R259 Unspecified abnormal involuntary movements: Secondary | ICD-10-CM

## 2012-02-12 DIAGNOSIS — I7300 Raynaud's syndrome without gangrene: Secondary | ICD-10-CM

## 2012-02-12 DIAGNOSIS — H9209 Otalgia, unspecified ear: Secondary | ICD-10-CM

## 2012-02-12 DIAGNOSIS — B49 Unspecified mycosis: Secondary | ICD-10-CM

## 2012-02-12 DIAGNOSIS — F1027 Alcohol dependence with alcohol-induced persisting dementia: Secondary | ICD-10-CM

## 2012-02-12 DIAGNOSIS — R52 Pain, unspecified: Secondary | ICD-10-CM

## 2012-02-12 DIAGNOSIS — I2510 Atherosclerotic heart disease of native coronary artery without angina pectoris: Secondary | ICD-10-CM

## 2012-02-12 DIAGNOSIS — K589 Irritable bowel syndrome without diarrhea: Secondary | ICD-10-CM

## 2012-02-12 DIAGNOSIS — J4489 Other specified chronic obstructive pulmonary disease: Secondary | ICD-10-CM

## 2012-02-12 DIAGNOSIS — E785 Hyperlipidemia, unspecified: Secondary | ICD-10-CM

## 2012-02-12 DIAGNOSIS — N3941 Urge incontinence: Secondary | ICD-10-CM

## 2012-02-12 DIAGNOSIS — E119 Type 2 diabetes mellitus without complications: Secondary | ICD-10-CM

## 2012-02-12 DIAGNOSIS — K030 Excessive attrition of teeth: Secondary | ICD-10-CM

## 2012-02-12 DIAGNOSIS — R410 Disorientation, unspecified: Secondary | ICD-10-CM

## 2012-02-12 DIAGNOSIS — M3390 Dermatopolymyositis, unspecified, organ involvement unspecified: Secondary | ICD-10-CM

## 2012-02-12 DIAGNOSIS — E876 Hypokalemia: Secondary | ICD-10-CM

## 2012-02-12 DIAGNOSIS — IMO0001 Reserved for inherently not codable concepts without codable children: Secondary | ICD-10-CM

## 2012-02-12 DIAGNOSIS — I259 Chronic ischemic heart disease, unspecified: Secondary | ICD-10-CM

## 2012-02-12 DIAGNOSIS — I4891 Unspecified atrial fibrillation: Secondary | ICD-10-CM

## 2012-02-12 DIAGNOSIS — Z609 Problem related to social environment, unspecified: Secondary | ICD-10-CM

## 2012-02-12 DIAGNOSIS — S065XAA Traumatic subdural hemorrhage with loss of consciousness status unknown, initial encounter: Secondary | ICD-10-CM

## 2012-02-12 DIAGNOSIS — M3313 Other dermatomyositis without myopathy: Secondary | ICD-10-CM

## 2012-02-12 DIAGNOSIS — M069 Rheumatoid arthritis, unspecified: Secondary | ICD-10-CM

## 2012-02-12 DIAGNOSIS — I10 Essential (primary) hypertension: Secondary | ICD-10-CM

## 2012-02-12 DIAGNOSIS — Z8669 Personal history of other diseases of the nervous system and sense organs: Secondary | ICD-10-CM

## 2012-02-12 DIAGNOSIS — R42 Dizziness and giddiness: Secondary | ICD-10-CM

## 2012-02-12 DIAGNOSIS — J449 Chronic obstructive pulmonary disease, unspecified: Secondary | ICD-10-CM

## 2012-02-12 DIAGNOSIS — Z720 Tobacco use: Secondary | ICD-10-CM | POA: Diagnosis present

## 2012-02-12 DIAGNOSIS — F411 Generalized anxiety disorder: Secondary | ICD-10-CM

## 2012-02-12 DIAGNOSIS — K219 Gastro-esophageal reflux disease without esophagitis: Secondary | ICD-10-CM | POA: Diagnosis present

## 2012-02-12 DIAGNOSIS — F17200 Nicotine dependence, unspecified, uncomplicated: Secondary | ICD-10-CM

## 2012-02-12 DIAGNOSIS — F329 Major depressive disorder, single episode, unspecified: Secondary | ICD-10-CM

## 2012-02-12 DIAGNOSIS — K5730 Diverticulosis of large intestine without perforation or abscess without bleeding: Secondary | ICD-10-CM

## 2012-02-12 DIAGNOSIS — E669 Obesity, unspecified: Secondary | ICD-10-CM

## 2012-02-12 DIAGNOSIS — R569 Unspecified convulsions: Secondary | ICD-10-CM

## 2012-02-12 DIAGNOSIS — K224 Dyskinesia of esophagus: Secondary | ICD-10-CM

## 2012-02-12 DIAGNOSIS — S065X9A Traumatic subdural hemorrhage with loss of consciousness of unspecified duration, initial encounter: Secondary | ICD-10-CM

## 2012-02-12 DIAGNOSIS — F29 Unspecified psychosis not due to a substance or known physiological condition: Secondary | ICD-10-CM

## 2012-02-12 DIAGNOSIS — R4189 Other symptoms and signs involving cognitive functions and awareness: Secondary | ICD-10-CM

## 2012-02-12 LAB — PHENYTOIN LEVEL, TOTAL: Phenytoin Lvl: <2.5 ug/mL — ABNORMAL LOW (ref 10.0–20.0)

## 2012-02-12 LAB — COMPREHENSIVE METABOLIC PANEL
ALT: 45 U/L (ref 0–53)
AST: 66 U/L — ABNORMAL HIGH (ref 0–37)
CO2: 29 mEq/L (ref 19–32)
Calcium: 9.4 mg/dL (ref 8.4–10.5)
Chloride: 94 mEq/L — ABNORMAL LOW (ref 96–112)
Creatinine, Ser: 0.89 mg/dL (ref 0.50–1.35)
GFR calc Af Amer: >90 mL/min (ref >90)
GFR calc non Af Amer: 90 mL/min — ABNORMAL LOW (ref >90)
Glucose, Bld: 99 mg/dL (ref 70–99)
Sodium: 139 mEq/L (ref 135–145)
Total Bilirubin: 1.3 mg/dL — ABNORMAL HIGH (ref 0.3–1.2)

## 2012-02-12 LAB — CBC WITH DIFFERENTIAL/PLATELET
Eosinophils Relative: 0 % (ref 0–5)
HCT: 46.4 % (ref 39.0–52.0)
Hemoglobin: 15.8 g/dL (ref 13.0–17.0)
Lymphocytes Relative: 12 % (ref 12–46)
Lymphs Abs: 1.8 10*3/uL (ref 0.7–4.0)
MCV: 100.9 fL — ABNORMAL HIGH (ref 78.0–100.0)
Monocytes Absolute: 1.6 10*3/uL — ABNORMAL HIGH (ref 0.1–1.0)
Neutro Abs: 11.2 10*3/uL — ABNORMAL HIGH (ref 1.7–7.7)
RBC: 4.6 MIL/uL (ref 4.22–5.81)
WBC: 14.6 10*3/uL — ABNORMAL HIGH (ref 4.0–10.5)

## 2012-02-12 LAB — ETHANOL: Alcohol, Ethyl (B): <11 mg/dL (ref 0–11)

## 2012-02-12 MED ORDER — OXYBUTYNIN CHLORIDE 5 MG PO TABS
5.0000 mg | ORAL_TABLET | Freq: Three times a day (TID) | ORAL | Status: DC
  Administered 2012-02-13 – 2012-02-21 (×24): 5 mg via ORAL
  Filled 2012-02-12 (×30): qty 1

## 2012-02-12 MED ORDER — IBUPROFEN 600 MG PO TABS
600.0000 mg | ORAL_TABLET | Freq: Three times a day (TID) | ORAL | Status: DC | PRN
  Administered 2012-02-14: 600 mg via ORAL
  Filled 2012-02-12: qty 1

## 2012-02-12 MED ORDER — GLIPIZIDE ER 5 MG PO TB24
5.0000 mg | ORAL_TABLET | Freq: Every day | ORAL | Status: DC
  Administered 2012-02-13 – 2012-02-20 (×7): 5 mg via ORAL
  Filled 2012-02-12 (×11): qty 1

## 2012-02-12 MED ORDER — ZOLPIDEM TARTRATE 5 MG PO TABS
5.0000 mg | ORAL_TABLET | Freq: Every evening | ORAL | Status: DC | PRN
  Administered 2012-02-13: 5 mg via ORAL
  Filled 2012-02-12: qty 1

## 2012-02-12 MED ORDER — MAGNESIUM OXIDE 400 (241.3 MG) MG PO TABS
400.0000 mg | ORAL_TABLET | Freq: Every day | ORAL | Status: DC
  Administered 2012-02-13 – 2012-02-21 (×9): 400 mg via ORAL
  Filled 2012-02-12 (×10): qty 1

## 2012-02-12 MED ORDER — VITAMIN B-1 100 MG PO TABS
100.0000 mg | ORAL_TABLET | Freq: Every day | ORAL | Status: DC
  Administered 2012-02-13 – 2012-02-21 (×9): 100 mg via ORAL
  Filled 2012-02-12 (×9): qty 1

## 2012-02-12 MED ORDER — DOXYCYCLINE HYCLATE 100 MG PO TABS
100.0000 mg | ORAL_TABLET | Freq: Two times a day (BID) | ORAL | Status: DC
  Administered 2012-02-13 – 2012-02-21 (×17): 100 mg via ORAL
  Filled 2012-02-12 (×18): qty 1

## 2012-02-12 MED ORDER — NICOTINE 21 MG/24HR TD PT24
21.0000 mg | MEDICATED_PATCH | Freq: Every day | TRANSDERMAL | Status: DC | PRN
  Filled 2012-02-12: qty 1

## 2012-02-12 MED ORDER — NIACIN ER (ANTIHYPERLIPIDEMIC) 500 MG PO TBCR
1000.0000 mg | EXTENDED_RELEASE_TABLET | Freq: Every day | ORAL | Status: DC
  Filled 2012-02-12 (×3): qty 2

## 2012-02-12 MED ORDER — AMIODARONE HCL 200 MG PO TABS
400.0000 mg | ORAL_TABLET | Freq: Three times a day (TID) | ORAL | Status: AC
  Administered 2012-02-13 – 2012-02-14 (×6): 400 mg via ORAL
  Filled 2012-02-12 (×10): qty 2

## 2012-02-12 MED ORDER — ROPINIROLE HCL 1 MG PO TABS
2.0000 mg | ORAL_TABLET | Freq: Three times a day (TID) | ORAL | Status: DC
  Administered 2012-02-13 – 2012-02-17 (×13): 2 mg via ORAL
  Filled 2012-02-12 (×17): qty 2

## 2012-02-12 MED ORDER — POTASSIUM CHLORIDE CRYS ER 20 MEQ PO TBCR
40.0000 meq | EXTENDED_RELEASE_TABLET | Freq: Once | ORAL | Status: AC
  Administered 2012-02-12: 40 meq via ORAL
  Filled 2012-02-12: qty 2

## 2012-02-12 MED ORDER — ONDANSETRON HCL 4 MG PO TABS: 4.0000 mg | ORAL_TABLET | Freq: Three times a day (TID) | ORAL | Status: DC | PRN

## 2012-02-12 MED ORDER — SODIUM CHLORIDE 0.9 % IV SOLN
Freq: Once | INTRAVENOUS | Status: AC
  Administered 2012-02-12: 20:00:00 via INTRAVENOUS

## 2012-02-12 MED ORDER — DICYCLOMINE HCL 20 MG PO TABS
20.0000 mg | ORAL_TABLET | Freq: Two times a day (BID) | ORAL | Status: DC
  Administered 2012-02-13 – 2012-02-21 (×17): 20 mg via ORAL
  Filled 2012-02-12 (×20): qty 1

## 2012-02-12 MED ORDER — PANTOPRAZOLE SODIUM 40 MG PO TBEC
80.0000 mg | DELAYED_RELEASE_TABLET | Freq: Every day | ORAL | Status: DC
  Administered 2012-02-13 – 2012-02-21 (×9): 80 mg via ORAL
  Filled 2012-02-12: qty 2
  Filled 2012-02-12 (×3): qty 1
  Filled 2012-02-12 (×3): qty 2
  Filled 2012-02-12: qty 1
  Filled 2012-02-12 (×2): qty 2

## 2012-02-12 MED ORDER — SODIUM CHLORIDE 0.9 % IV BOLUS (SEPSIS)
1000.0000 mL | Freq: Once | INTRAVENOUS | Status: AC
  Administered 2012-02-12: 1000 mL via INTRAVENOUS

## 2012-02-12 MED ORDER — POTASSIUM CHLORIDE CRYS ER 20 MEQ PO TBCR
20.0000 meq | EXTENDED_RELEASE_TABLET | Freq: Every day | ORAL | Status: DC
  Administered 2012-02-13 – 2012-02-21 (×9): 20 meq via ORAL
  Filled 2012-02-12 (×9): qty 1

## 2012-02-12 MED ORDER — FOLIC ACID 1 MG PO TABS: 1.0000 mg | ORAL_TABLET | ORAL | Status: DC

## 2012-02-12 MED ORDER — HYDROCHLOROTHIAZIDE 12.5 MG PO CAPS
12.5000 mg | ORAL_CAPSULE | Freq: Every day | ORAL | Status: DC
  Administered 2012-02-13: 12.5 mg via ORAL
  Filled 2012-02-12 (×2): qty 1

## 2012-02-12 MED ORDER — TRAZODONE HCL 50 MG PO TABS
50.0000 mg | ORAL_TABLET | Freq: Every day | ORAL | Status: DC
  Administered 2012-02-13 – 2012-02-20 (×9): 50 mg via ORAL
  Filled 2012-02-12 (×10): qty 1

## 2012-02-12 MED ORDER — POTASSIUM CHLORIDE 10 MEQ/100ML IV SOLN
10.0000 meq | INTRAVENOUS | Status: AC
  Administered 2012-02-12 (×4): 10 meq via INTRAVENOUS
  Filled 2012-02-12 (×4): qty 100

## 2012-02-12 MED ORDER — ALUM & MAG HYDROXIDE-SIMETH 200-200-20 MG/5ML PO SUSP
30.0000 mL | ORAL | Status: DC | PRN
  Filled 2012-02-12: qty 30

## 2012-02-12 MED ORDER — ACETAMINOPHEN 325 MG PO TABS
650.0000 mg | ORAL_TABLET | ORAL | Status: DC | PRN
  Administered 2012-02-16: 650 mg via ORAL
  Filled 2012-02-12: qty 2

## 2012-02-12 MED ORDER — DIPHENOXYLATE-ATROPINE 2.5-0.025 MG PO TABS: 1.0000 | ORAL_TABLET | Freq: Four times a day (QID) | ORAL | Status: DC | PRN

## 2012-02-12 MED ORDER — TORSEMIDE 10 MG PO TABS
10.0000 mg | ORAL_TABLET | Freq: Every day | ORAL | Status: DC
  Administered 2012-02-13: 10 mg via ORAL
  Filled 2012-02-12 (×2): qty 1

## 2012-02-12 MED ORDER — METOPROLOL TARTRATE 50 MG PO TABS
50.0000 mg | ORAL_TABLET | Freq: Two times a day (BID) | ORAL | Status: DC
  Administered 2012-02-13: 50 mg via ORAL
  Filled 2012-02-12 (×3): qty 1
  Filled 2012-02-12: qty 2

## 2012-02-12 MED ORDER — DULOXETINE HCL 60 MG PO CPEP
60.0000 mg | ORAL_CAPSULE | Freq: Two times a day (BID) | ORAL | Status: DC
  Administered 2012-02-13 – 2012-02-21 (×18): 60 mg via ORAL
  Filled 2012-02-12 (×20): qty 1

## 2012-02-12 MED ORDER — DICLOFENAC SODIUM 75 MG PO TBEC
75.0000 mg | DELAYED_RELEASE_TABLET | Freq: Two times a day (BID) | ORAL | Status: DC | PRN
Start: 2012-02-12 — End: 2012-02-12

## 2012-02-12 MED ORDER — LEVETIRACETAM 500 MG PO TABS
500.0000 mg | ORAL_TABLET | Freq: Two times a day (BID) | ORAL | Status: DC
  Administered 2012-02-13 (×2): 500 mg via ORAL
  Filled 2012-02-12 (×3): qty 1

## 2012-02-12 MED ORDER — LISINOPRIL-HYDROCHLOROTHIAZIDE 10-12.5 MG PO TABS: 1.0000 | ORAL_TABLET | Freq: Every day | ORAL | Status: DC

## 2012-02-12 MED ORDER — LISINOPRIL 10 MG PO TABS
10.0000 mg | ORAL_TABLET | Freq: Every day | ORAL | Status: DC
  Administered 2012-02-13: 10 mg via ORAL
  Filled 2012-02-12 (×2): qty 1

## 2012-02-12 MED ORDER — METHOTREXATE 2.5 MG PO TABS: 7.5000 mg | ORAL_TABLET | ORAL | Status: DC

## 2012-02-12 MED ORDER — ASPIRIN EC 81 MG PO TBEC
81.0000 mg | DELAYED_RELEASE_TABLET | Freq: Every day | ORAL | Status: DC
  Administered 2012-02-13 – 2012-02-21 (×9): 81 mg via ORAL
  Filled 2012-02-12 (×10): qty 1

## 2012-02-12 MED ORDER — IPRATROPIUM-ALBUTEROL 20-100 MCG/ACT IN AERS
2.0000 | INHALATION_SPRAY | RESPIRATORY_TRACT | Status: DC | PRN
  Filled 2012-02-12: qty 4

## 2012-02-12 MED ORDER — ALPRAZOLAM 0.5 MG PO TABS
1.0000 mg | ORAL_TABLET | Freq: Three times a day (TID) | ORAL | Status: DC | PRN
  Administered 2012-02-13 – 2012-02-20 (×11): 1 mg via ORAL
  Filled 2012-02-12 (×11): qty 2

## 2012-02-12 NOTE — ED Notes (Signed)
Safety sitter at bedside at this time 

## 2012-02-12 NOTE — ED Notes (Signed)
Making several attempts to get pt to urinate. Pt verbalizes ability to urinate but will not. Urinal at pt bedside. Will continue to monitor. RN Drenda Freeze notified

## 2012-02-12 NOTE — ED Notes (Signed)
Returned from CT.

## 2012-02-12 NOTE — ED Provider Notes (Signed)
History     CSN: [id number]  Arrival date & time 02/12/12  1830   First MD Initiated Contact with Patient 02/12/12 1944      Chief Complaint  Patient presents with  . Medical Clearance    (Consider location/radiation/quality/duration/timing/severity/associated sxs/prior treatment) HPI Comments: Patrick Holt is a 61 y.o. Male who was found wandering outside of his home, naked and carrying a BB gun. He reportedly lives alone. He is unable to give any history. He was brought in by EMS  Patient was recently hospitalized, with similar, incoming h.levistory. During that stay. He had seizures, and required treatment with Dilantin. He also takes Keppra.   Level V Caveat- Altered Mental Status  The history is provided by the patient.    Past Medical History  Diagnosis Date  . Rheumatoid arthritis   . Obesity   . Major depression     with acute psychotic break in 06/2010  . Diabetes mellitus   . Hypertension   . Hyperlipidemia   . Diverticulosis of colon   . COPD (chronic obstructive pulmonary disease)   . Anxiety   . GERD (gastroesophageal reflux disease)   . Vertigo   . Seizures   . Fibromyalgia   . Dermatomyositis   . Myocardial infarct     mulitple (1999, 2000, 2004)  . Raynaud's disease   . Narcotic dependence   . Peripheral neuropathy   . Internal hemorrhoids   . Ischemic heart disease   . Hiatal hernia   . Gastritis   . Diverticulitis   . Hx of adenomatous colonic polyps   . Nephrolithiasis   . Anemia   . Esophageal stricture   . Esophageal dysmotility   . Dermatomyositis   . Paroxysmal a-fib   . Urge incontinence   . Otosclerosis     Past Surgical History  Procedure Date  . Knee arthroscopy w/ meniscal repair 2009    left  . Lumbar disc surgery   . Squamous papilloma  2010    removed by Dr. Pollyann Kennedy ENT, noted on tongue    Family History  Problem Relation Age of Onset  . Alcohol abuse Mother   . Depression Mother   . Heart disease Mother     . Diabetes Mother   . Stroke Mother   . Diabetes Other     1/2 brother    History  Substance Use Topics  . Smoking status: Current Every Day Smoker -- 0.5 packs/day  . Smokeless tobacco: Not on file  . Alcohol Use: 0.6 oz/week    1 Glasses of wine per week     Comment: daily      Review of Systems  Unable to perform ROS   Allergies  Betamethasone dipropionate; Bupropion hcl; Ciprofloxacin; Clobetasol; Codeine; Escitalopram oxalate; Fluoxetine hcl; Fluticasone-salmeterol; Furosemide; Immune globulins; Metronidazole; Paroxetine; Penicillins; Statins; Tacrolimus; Tetanus toxoid; and Tuberculin purified protein derivative  Home Medications   Current Outpatient Rx  Name  Route  Sig  Dispense  Refill  . IPRATROPIUM-ALBUTEROL 18-103 MCG/ACT IN AERO   Inhalation   Inhale 2 puffs into the lungs every 4 (four) hours as needed. For shortness of breath/wheeze          . ALPRAZOLAM 1 MG PO TABS   Oral   Take 1 mg by mouth 3 (three) times daily as needed. For anxiety         . AMIODARONE HCL 200 MG PO TABS   Oral   Take 2 tablets (400 mg total) by  mouth 2 (two) times daily.   28 tablet   0   . AMIODARONE HCL 200 MG PO TABS   Oral   Take 2 tablets (400 mg total) by mouth daily.   14 tablet   0   . AMIODARONE HCL 200 MG PO TABS   Oral   Take 1 tablet (200 mg total) by mouth daily.   30 tablet   3   . AMIODARONE HCL 400 MG PO TABS   Oral   Take 1 tablet (400 mg total) by mouth every 8 (eight) hours.   21 tablet   0   . ASPIRIN 81 MG PO TBEC   Oral   Take 81 mg by mouth daily.           Marland Kitchen DICLOFENAC SODIUM 75 MG PO TBEC   Oral   Take 1 tablet (75 mg total) by mouth 2 (two) times daily as needed. For back pain   60 tablet   3   . DICYCLOMINE HCL 20 MG PO TABS   Oral   Take 1 tablet (20 mg total) by mouth 2 (two) times daily.   60 tablet   1   . DIPHENOXYLATE-ATROPINE 2.5-0.025 MG PO TABS   Oral   Take 1 tablet by mouth 4 (four) times daily as needed  for diarrhea or loose stools.   90 tablet   5   . DOXYCYCLINE HYCLATE 100 MG PO TABS   Oral   Take 1 tablet (100 mg total) by mouth every 12 (twelve) hours.   12 tablet   0   . DULOXETINE HCL 60 MG PO CPEP   Oral   Take 60 mg by mouth 2 (two) times daily.          Marland Kitchen ESOMEPRAZOLE MAGNESIUM 40 MG PO CPDR   Oral   Take 1 capsule (40 mg total) by mouth daily before breakfast.   30 capsule   1   . FOLIC ACID 1 MG PO TABS   Oral   Take 1 mg by mouth as directed. One tab by mouth on days not taking methotrexate          . GLIPIZIDE ER 5 MG PO TB24   Oral   Take 5 mg by mouth daily.         Marland Kitchen LEVETIRACETAM 500 MG PO TABS   Oral   Take 1 tablet (500 mg total) by mouth 2 (two) times daily.   60 tablet   3   . LISINOPRIL-HYDROCHLOROTHIAZIDE 10-12.5 MG PO TABS   Oral   Take 1 tablet by mouth daily.   30 tablet   11   . MAGNESIUM OXIDE 400 MG PO TABS   Oral   Take 400 mg by mouth daily.         Marland Kitchen METHOTREXATE 2.5 MG PO TABS   Oral   Take 3 tablets (7.5 mg total) by mouth once a week.   15 tablet   0   . METOPROLOL TARTRATE 50 MG PO TABS   Oral   Take 1 tablet (50 mg total) by mouth 2 (two) times daily.   60 tablet   3   . NIACIN ER (ANTIHYPERLIPIDEMIC) 1000 MG PO TBCR   Oral   Take 1,000 mg by mouth at bedtime.         Marland Kitchen NITROGLYCERIN 0.4 MG SL SUBL   Sublingual   Place 0.4 mg under the tongue every 5 (five) minutes as needed. For  chest pain         . OXYBUTYNIN CHLORIDE 5 MG PO TABS   Oral   Take 1 tablet (5 mg total) by mouth 3 (three) times daily.   90 tablet   0   . POTASSIUM CHLORIDE CRYS ER 20 MEQ PO TBCR   Oral   Take 1 tablet (20 mEq total) by mouth daily.   30 tablet   0     Make office visit within one month for check.   Marland Kitchen PREDNISONE 5 MG PO TABS   Oral   Take 1 tablet (5 mg total) by mouth daily.   30 tablet   5   . ROPINIROLE HCL 2 MG PO TABS   Oral   Take 2 mg by mouth 3 (three) times daily as needed. For tremors          . VITAMIN B-1 100 MG PO TABS   Oral   Take 100 mg by mouth daily.         . TORSEMIDE 10 MG PO TABS   Oral   Take 1 tablet (10 mg total) by mouth daily.   30 tablet   0     Needs doctor visit prior to refills   . TRAZODONE HCL 50 MG PO TABS   Oral   Take 1 tablet (50 mg total) by mouth at bedtime.   30 tablet   3     BP 110/88  Pulse 107  Temp 99.7 F (37.6 C) (Rectal)  Resp 24  SpO2 95%  Physical Exam  Nursing note and vitals reviewed. Constitutional: He appears well-developed and well-nourished.  HENT:  Head: Normocephalic and atraumatic.  Right Ear: External ear normal.  Left Ear: External ear normal.       Mucus membranes are dry  Eyes: Conjunctivae normal and EOM are normal. Pupils are equal, round, and reactive to light.  Neck: Normal range of motion and phonation normal. Neck supple.  Cardiovascular: Normal rate, regular rhythm, normal heart sounds and intact distal pulses.   Pulmonary/Chest: Effort normal and breath sounds normal. He exhibits no bony tenderness.  Abdominal: Soft. Normal appearance. There is no tenderness.  Musculoskeletal: Normal range of motion.  Neurological: He is alert. He has normal strength. No cranial nerve deficit or sensory deficit. He exhibits normal muscle tone. Coordination normal.       He is distractible, and will minimally follow commands.  Skin: Skin is warm, dry and intact.  Psychiatric:       Anxious and agitated    ED Course  Procedures (including critical care time)]   Emergency department treatment: IV fluid bolus, and drip Oral and IV potassium Second liter of saline, given Record review from recent admission, indicates that he was not discharged on Dilantin. It appears that neurology was managing his seizure disorder, with Keppra. A Keppra level has been ordered. Patient has not had seizure. In the emergency department. He has not been post-ictal.     Labs Reviewed  CBC WITH DIFFERENTIAL - Abnormal;  Notable for the following:    WBC 14.6 (*)     MCV 100.9 (*)     MCH 34.3 (*)     Neutro Abs 11.2 (*)     Monocytes Absolute 1.6 (*)     All other components within normal limits  COMPREHENSIVE METABOLIC PANEL - Abnormal; Notable for the following:    Potassium 2.6 (*)     Chloride 94 (*)     AST  66 (*)     Total Bilirubin 1.3 (*)     GFR calc non Af Amer 90 (*)     All other components within normal limits  GLUCOSE, CAPILLARY - Abnormal; Notable for the following:    Glucose-Capillary 113 (*)     All other components within normal limits  PHENYTOIN LEVEL, TOTAL - Abnormal; Notable for the following:    Phenytoin Lvl <2.5 (*)     All other components within normal limits  ETHANOL  URINE RAPID DRUG SCREEN (HOSP PERFORMED)  URINALYSIS, ROUTINE W REFLEX MICROSCOPIC  LEVETIRACETAM LEVEL   No results found.   1. Confusion   2. Psychosis   3. Hypokalemia       MDM  Confusion, with known psychosis, and psychiatric disease. He appears to be worsening, since recent hospitalization. Doubt dilerium or medical cause for confusion. Treatment for dehydration, and hypokalemia, begun in emergency department. This should stabilize, and normalize; with the treatment given. He is stable for psychiatric treatment.           Flint Melter, MD 02/12/12 [phone]

## 2012-02-12 NOTE — ED Notes (Signed)
AVW:[id number]<XB> Expected date:02/12/12<BR> Expected time: 6:34 PM<BR> Means of arrival:<BR> Comments:<BR> Med clearance

## 2012-02-12 NOTE — ED Notes (Signed)
Patient placed on cardiac monitor. Monitor shows a-fib without ectopy.

## 2012-02-12 NOTE — ED Notes (Signed)
Neighbors called EMS stating that patient was wandering around outside naked with a BB gun.  Patient has been seen for same symptoms in the past.  Pt lives alone, and EMS said the house was disheveled and dirty.  Pt is verbalizing but appears to be irrational in thought and action. CBG-120

## 2012-02-13 DIAGNOSIS — F29 Unspecified psychosis not due to a substance or known physiological condition: Secondary | ICD-10-CM

## 2012-02-13 LAB — RAPID URINE DRUG SCREEN, HOSP PERFORMED
Barbiturates: NOT DETECTED
Benzodiazepines: NOT DETECTED

## 2012-02-13 LAB — URINALYSIS, ROUTINE W REFLEX MICROSCOPIC
Hgb urine dipstick: NEGATIVE
Leukocytes, UA: NEGATIVE
Nitrite: NEGATIVE
Protein, ur: 30 mg/dL — AB
Specific Gravity, Urine: 1.03 (ref 1.005–1.030)
Urobilinogen, UA: 1 mg/dL (ref 0.0–1.0)

## 2012-02-13 LAB — GLUCOSE, CAPILLARY: Glucose-Capillary: 88 mg/dL (ref 70–99)

## 2012-02-13 LAB — URINE MICROSCOPIC-ADD ON

## 2012-02-13 MED ORDER — AMIODARONE HCL 200 MG PO TABS
400.0000 mg | ORAL_TABLET | Freq: Two times a day (BID) | ORAL | Status: DC
  Administered 2012-02-16 – 2012-02-21 (×11): 400 mg via ORAL
  Filled 2012-02-13 (×13): qty 2

## 2012-02-13 MED ORDER — AMIODARONE HCL 200 MG PO TABS: 200.0000 mg | ORAL_TABLET | Freq: Every day | ORAL | Status: DC

## 2012-02-13 MED ORDER — AMIODARONE HCL 200 MG PO TABS: 400.0000 mg | ORAL_TABLET | Freq: Every day | ORAL | Status: DC

## 2012-02-13 MED ORDER — LORAZEPAM 1 MG PO TABS
1.0000 mg | ORAL_TABLET | Freq: Once | ORAL | Status: AC
  Administered 2012-02-13: 1 mg via ORAL
  Filled 2012-02-13: qty 1

## 2012-02-13 MED ORDER — SODIUM CHLORIDE 0.9 % IV BOLUS (SEPSIS)
1000.0000 mL | Freq: Once | INTRAVENOUS | Status: AC
  Administered 2012-02-13: 1000 mL via INTRAVENOUS

## 2012-02-13 MED ORDER — DIVALPROEX SODIUM 500 MG PO DR TAB
500.0000 mg | DELAYED_RELEASE_TABLET | Freq: Two times a day (BID) | ORAL | Status: DC
Start: 2012-02-13 — End: 2012-02-21
  Administered 2012-02-13 – 2012-02-21 (×16): 500 mg via ORAL
  Filled 2012-02-13 (×17): qty 1

## 2012-02-13 MED ORDER — VALPROATE SODIUM 500 MG/5ML IV SOLN
1500.0000 mg | INTRAVENOUS | Status: AC
  Administered 2012-02-13: 1500 mg via INTRAVENOUS
  Filled 2012-02-13: qty 15

## 2012-02-13 MED ORDER — FOLIC ACID 1 MG PO TABS
1.0000 mg | ORAL_TABLET | Freq: Every day | ORAL | Status: DC
  Administered 2012-02-13 – 2012-02-21 (×9): 1 mg via ORAL
  Filled 2012-02-13 (×9): qty 1

## 2012-02-13 MED ORDER — NIACIN ER 500 MG PO CPCR
1000.0000 mg | ORAL_CAPSULE | Freq: Every day | ORAL | Status: DC
  Administered 2012-02-13 – 2012-02-20 (×8): 1000 mg via ORAL
  Filled 2012-02-13 (×10): qty 2

## 2012-02-13 NOTE — ED Provider Notes (Addendum)
Pt sleeping in room, no distress, no reported events overnight. Pending ACT eval/placement for psychosis.   3:08 PM Telepsych eval recommends neurology evaluate for acute delirium. Dr. Amada Jupiter with Neurology has seen the patient, he initially felt that the patient's symptoms were related to a psychiatric process, but on further reading into his chart, he is concerned about occult seizures. He has asked that the patient be admitted to [facility] hospital for EEG, possibly 24hr continuous. He was recently on the [facility] service so will contact them for admission.   Edelmiro Innocent B. Bernette Mayers, MD 02/13/12 1510

## 2012-02-13 NOTE — ED Notes (Signed)
Patient awake. Alert to person. Continues to talk about his prayer life and Jesus. Patient has been trying to pull off pulse oximeter probe; patient has to be redirected. Patient is cooperative; Comptroller at bedside.

## 2012-02-13 NOTE — H&P (Signed)
Family Medicine Teaching [facility] Admission History and Physical Service Pager: [phone]  Patient name: Patrick Holt Medical record number: [id number] Date of birth: 27-Oct-1950 Age: 61 y.o. Gender: male  Primary Care Provider: Default, Provider, MD  Chief Complaint: "Was not doing well"  Assessment and Plan: Patrick Holt is a 61 y.o. year old male with a history of previous psychotic episode in 2012, seizures, anxiety, depression, afib, and DM II presenting from [facility] ED as a transfer after being found by police walking naked in the road with a BB gun.  1. Neuro/Psych: patient with previous history of seizures and psychotic episode presenting with psychosis like behaviors and thought process. Per neurology and behavioral health consult notes the patient appears hyper-religious, has tangential thought process, and exhibits grandiose delusions. Neurology asked that patient be admitted for EEG evaluation given seizure history.  -admit to FPTS  -follow neurology recommendations regarding discontinuing keppra and starting depakote-he has been loaded with 15 mg/kg Depakote to achieve therapeutic level  -will plan for EEG in the morning  -continue to follow neuro and psych recommendations  -tremor-continue home ropinirole  -continue home trazodone, cymbalta, and prn xanax  2.  CV: patient with history of afib, HTN.  -afib: recently found to have Paroxysmal Atrial Fibrillation with RVR at recent hospitalization. Was supposed to be discharged on amiodarone taper (400mg  Q8 x 7 days then 400mg  BID  x14 days then 400mg  Qday x14 days then 200mg  Qday)   -place patient on cardiac monitoring   -will continue amiodarone taper  -HTN: BP currently low in the 90's/60's. Appears to have had several low BPs during ED visit  on 12/25.   -will hold home lopressor, lisinopril, and HCTZ given low BP on admission   -will hold home torsemide due to low BP-patient has not been taking this per epic        Documentation   -continue to follow vitals  3. Pulm: patient with history of COPD. No respiratory complaints at this time.  -continue home combivent  -nicotine replacement patch  4. Endo: patient with history of DM II. Most recent HbA1c 5.5.  -continue home glipizide  5. FEN/GI: patient with history of IBS and GERD, also with hypokalemia  -continue home Bentyl for IBS  -protonix for GERD  -patient with hypokalemia-will continue to replete with daily k-dur tablet  -continue home vitamin B1  -zofran prn for nausea  -lomotil prn for diarrhea  -continue folic acid, magnesium oxide  6. ID: patient with recent history of right proximal tibia cellulitis, appears to be healing lesion at this time. Being treated with Doxycycline that pt picked up from pharmacy but unsure if ever took. Also with elevated WBC to 14.6. Currently afebrile. No signs of systemic infection to point to cause of elevated WBC.  -will continue doxycyline.  -am CBC to follow WBC             -wound care consult.  7. 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               - will hold on prednisone due to acute pt psychosis. Methotrexate is once a week, we will hold for now as well.   Prophylaxis: SCDs Disposition: pending EEG and neurology evaluation, in addition  Code Status: full  History of Present Illness: Patrick Holt is a 61 y.o. year old male with a history of previous psychotic episode in 2012, seizures, anxiety, depression, afib, and DM II presenting  from [facility] ED as a transfer after being found by police walking naked in the road with a BB gun.  The patient is not able to contribute much of a history.  He only states he was not doing well previously, but is fine now.  States at the other hospital he says he was told he was going home and does not know why he is here now as he feels fine. States he needs his medications repeatedly.  Also states that all the metal in the room needs  to be removed as he is allergic and that he is allergic to badges.  States these "cause everything in his body."  In the [facility] ED he was evaluated by neurology who initially stated that this was a psychiatric issue, though upon further review of records the MD was concerned for possible recurrent seizures. The neurologist recommended transfer to [facility] for EEG evaluation. The patient was also evaluated by Behavioral Health who believed he was acutely psychotic and recommended inpatient psych treatment. Additionally during recent hospitalization with d/c on 12/24 an MRI showed small SDH vs SAH. Patient had no focal neurologic deficits at time of discharge and AAO x 3.   Patient Active Problem List  Diagnosis  . DIABETES MELLITUS, TYPE II  . HYPERLIPIDEMIA  . OBESITY, UNSPECIFIED  . DEPRESSION, MAJOR  . ANXIETY, CHRONIC  . TOBACCO USER  . HYPERTENSION  . ISCHEMIC HEART DISEASE  . RAYNAUD'S DISEASE  . HEMORRHOIDS, INTERNAL  . COPD  . ESOPHAGEAL MOTILITY DISORDER  . GERD  . HIATAL HERNIA  . DIVERTICULOSIS OF COLON  . Dermatomyositis  . Rheumatoid arthritis  . FIBROMYALGIA  . VERTIGO  . Generalized pain  . TREMOR, RIGHT HAND  . Urge incontinence  . Irritable bowel syndrome (IBS)  . History of seizure disorder  . Radicular low back pain  . Ear pain  . Dental attrition, excessive  . Fungal infection  . High risk social situation  . SDH (subdural hematoma)  . Atrial fibrillation  . Coronary atherosclerosis of native coronary artery  . Seizures   Past Medical History: Past Medical History  Diagnosis Date  . Rheumatoid arthritis   . Obesity   . Major depression     with acute psychotic break in 06/2010  . Diabetes mellitus   . Hypertension   . Hyperlipidemia   . Diverticulosis of colon   . COPD (chronic obstructive pulmonary disease)   . Anxiety   . GERD (gastroesophageal reflux disease)   . Vertigo   . Seizures   . Fibromyalgia   . Dermatomyositis   . Myocardial infarct      mulitple (1999, 2000, 2004)  . Raynaud's disease   . Narcotic dependence   . Peripheral neuropathy   . Internal hemorrhoids   . Ischemic heart disease   . Hiatal hernia   . Gastritis   . Diverticulitis   . Hx of adenomatous colonic polyps   . Nephrolithiasis   . Anemia   . Esophageal stricture   . Esophageal dysmotility   . Dermatomyositis   . Paroxysmal a-fib   . Urge incontinence   . Otosclerosis    Past Surgical History: Past Surgical History  Procedure Date  . Knee arthroscopy w/ meniscal repair 2009    left  . Lumbar disc surgery   . Squamous papilloma  2010    removed by Dr. Pollyann Kennedy ENT, noted on tongue   Social History: History  Substance Use Topics  . Smoking status: Current  Every Day Smoker -- 0.5 packs/day  . Smokeless tobacco: Not on file  . Alcohol Use: 0.6 oz/week    1 Glasses of wine per week     Comment: daily   For any additional social history documentation, please refer to relevant sections of EMR.  Family History: Family History  Problem Relation Age of Onset  . Alcohol abuse Mother   . Depression Mother   . Heart disease Mother   . Diabetes Mother   . Stroke Mother   . Diabetes Other     1/2 brother   Allergies: Allergies  Allergen Reactions  . Betamethasone Dipropionate     Unknown  . Bupropion Hcl     Unknown  . Ciprofloxacin     REACTION: swelling  . Clobetasol     Unknown  . Codeine     Unknown  . Escitalopram Oxalate     Unknown  . Fluoxetine Hcl     Unknown  . Fluticasone-Salmeterol     Unknown  . Furosemide     Unknown  . Immune Globulins     Acute renal failure  . Metronidazole     REACTION: swelling  . Paroxetine     Unknown  . Penicillins     Unknown  . Statins     Unknown  . Tacrolimus     Unknown  . Tetanus Toxoid     Unknown  . Tuberculin Purified Protein Derivative     Unknown   No current facility-administered medications on file prior to encounter.   Current Outpatient Prescriptions on File  Prior to Encounter  Medication Sig Dispense Refill  . albuterol-ipratropium (COMBIVENT) 18-103 MCG/ACT inhaler Inhale 2 puffs into the lungs every 4 (four) hours as needed. For shortness of breath/wheeze       . ALPRAZolam (XANAX) 1 MG tablet Take 1 mg by mouth 3 (three) times daily as needed. For anxiety      . amiodarone (PACERONE) 200 MG tablet Take 2 tablets (400 mg total) by mouth 2 (two) times daily.  28 tablet  0  . amiodarone (PACERONE) 200 MG tablet Take 2 tablets (400 mg total) by mouth daily.  14 tablet  0  . amiodarone (PACERONE) 200 MG tablet Take 1 tablet (200 mg total) by mouth daily.  30 tablet  3  . amiodarone (PACERONE) 400 MG tablet Take 1 tablet (400 mg total) by mouth every 8 (eight) hours.  21 tablet  0  . diclofenac (VOLTAREN) 75 MG EC tablet Take 1 tablet (75 mg total) by mouth 2 (two) times daily as needed. For back pain  60 tablet  3  . dicyclomine (BENTYL) 20 MG tablet Take 1 tablet (20 mg total) by mouth 2 (two) times daily.  60 tablet  1  . diphenoxylate-atropine (LOMOTIL) 2.5-0.025 MG per tablet Take 1 tablet by mouth 4 (four) times daily as needed for diarrhea or loose stools.  90 tablet  5  . DULoxetine (CYMBALTA) 60 MG capsule Take 60 mg by mouth 2 (two) times daily.       Marland Kitchen esomeprazole (NEXIUM) 40 MG capsule Take 1 capsule (40 mg total) by mouth daily before breakfast.  30 capsule  1  . glipiZIDE (GLUCOTROL XL) 5 MG 24 hr tablet Take 5 mg by mouth daily.      Marland Kitchen levETIRAcetam (KEPPRA) 500 MG tablet Take 1 tablet (500 mg total) by mouth 2 (two) times daily.  60 tablet  3  . lisinopril-hydrochlorothiazide (PRINZIDE,ZESTORETIC) 10-12.5 MG per  tablet Take 1 tablet by mouth daily.  30 tablet  11  . magnesium oxide (MAG-OX) 400 MG tablet Take 400 mg by mouth daily.      . metoprolol (LOPRESSOR) 50 MG tablet Take 1 tablet (50 mg total) by mouth 2 (two) times daily.  60 tablet  3  . niacin (NIASPAN) 1000 MG CR tablet Take 1,000 mg by mouth at bedtime.      . nitroGLYCERIN  (NITROSTAT) 0.4 MG SL tablet Place 0.4 mg under the tongue every 5 (five) minutes as needed. For chest pain      . oxybutynin (DITROPAN) 5 MG tablet Take 1 tablet (5 mg total) by mouth 3 (three) times daily.  90 tablet  0  . potassium chloride SA (K-DUR,KLOR-CON) 20 MEQ tablet Take 1 tablet (20 mEq total) by mouth daily.  30 tablet  0  . predniSONE (DELTASONE) 5 MG tablet Take 1 tablet (5 mg total) by mouth daily.  30 tablet  5  . rOPINIRole (REQUIP) 2 MG tablet Take 2 mg by mouth 3 (three) times daily as needed. For tremors      . thiamine (VITAMIN B-1) 100 MG tablet Take 100 mg by mouth daily.      Marland Kitchen torsemide (DEMADEX) 10 MG tablet Take 1 tablet (10 mg total) by mouth daily.  30 tablet  0  . traZODone (DESYREL) 50 MG tablet Take 1 tablet (50 mg total) by mouth at bedtime.  30 tablet  3  . doxycycline (VIBRA-TABS) 100 MG tablet Take 1 tablet (100 mg total) by mouth every 12 (twelve) hours.  12 tablet  0  . methotrexate (RHEUMATREX) 2.5 MG tablet Take 3 tablets (7.5 mg total) by mouth once a week.  15 tablet  0  . [DISCONTINUED] oxybutynin (DITROPAN) 5 MG tablet Take 1 tablet (5 mg total) by mouth 3 (three) times daily.  90 tablet  3   Review Of Systems: Per HPI with the following additions: denies nausea, vomiting, headache, abdominal pain, and chest pain Otherwise 12 point review of systems was performed and was unremarkable.  Physical Exam: BP 96/62  Pulse 55  Temp 97.5 F (36.4 C) (Oral)  Resp 20  Wt 183 lb 10.3 oz (83.3 kg)  SpO2 95% Exam: General: no acute distress, laying comfortably in bed, using hands to fidget with a napkin Cardiovascular: regular rate and rhythm, no murmurs, rubs or gallops Respiratory: CTAB anteriorly Abdomen: soft, nontender, nondistended, no masses felt Extremities: no edema Skin: wound 2 cm x 2 cm on upper right tibial area, mild erythema, no drainage, scar formation present, no fluctuation Neuro: able to move all extremities equally Psych: tangential  though process, perseveration on his medications and metal being in the room  Labs and Imaging: CBC BMET   Lab 02/12/12 1904  WBC 14.6*  HGB 15.8  HCT 46.4  PLT 365    Lab 02/13/12 0150 02/12/12 1904  NA -- 139  K 3.2* --  CL -- 94*  CO2 -- 29  BUN -- 16  CREATININE -- 0.89  GLUCOSE -- 99  CALCIUM -- 9.4    UDS: negative, Ethanol level: normal Urine dipstick shows positive for protein and positive for ketones and positive for bilirubin.  Micro exam: hyaline casts seen.  CT Head: Stable atrophy and small vessel disease. No acute findings.   Marikay Alar, MD 02/13/2012, 10:41 PM  Teaching Service Addendum. I have seen and evaluated this pt in conjunction with Dr. Birdie Sons and agree with his assessment  and plan as written in this note.  D. [place]oto Rolene Arbour, MD Family Medicine  PGY-2

## 2012-02-13 NOTE — ED Notes (Addendum)
Pt keeps talking about how there must be no metal, plastic or paper in his room and must be in room 2928.

## 2012-02-13 NOTE — ED Notes (Signed)
Turn patient to his right.

## 2012-02-13 NOTE — ED Notes (Signed)
Patient disoriented to place and time. Patient keeps talking about the metal in his body and praying to God. Patient re-oriented to place and time and redirected. Patient trying to pull IV out.

## 2012-02-13 NOTE — ED Notes (Signed)
Patient is resting with eyes closed. O2 sat down to 87% on room air. Patient placed on O2 at 2L/min via [place].

## 2012-02-13 NOTE — BH Assessment (Signed)
Assessment Note   Patrick Holt is an 61 y.o. male who presents to [facility] voluntarily via [employer] after concerned neighbors called. Pt states he lives alone. Per [employer], pt was naked in street with a BB gun. Pt is poor historian. He is acutely psychotic. He doesn't answer writer's questions. Instead, he talks about God and pt's sins being forgiven. When writer enters room, pt yells at Clinical research associate to remove her eyeglasses. Nurse tech who is present for assessment states that pt doesn't like eyeglasses. Writer tells pt she can't see without them. Pt then tells Clinical research associate that soon Clinical research associate will be able to see without glasses.  Pt exhibits tangential thought process and is hyper religious. Pt states reason he was brought to hospital was b/c "I was defibbing". He does answer "no" when asked whether he wants to harm himself. When asked re: weapons in his home, pt says he has a BB gun. Pt becomes tearful apropos to nothing and states he wants to see his grandparents. He asks Clinical research associate twice more to remove her eyeglasses. Pt has green pads between him and bed rails. Pt points to pads and tells writer it is a curtain and that he will answer writer's questions when curtain is moved. Pt's UDS is negative and BAL < 11. Pt needs inpatient treatment for stabilization. Writer will seek placement at a geri psych facility. Per pt's RN Drenda Freeze, pt has been unable to walk since his admission as his potassium is low. Drenda Freeze also says pt has been unable to hold a cup of water since admission.   Axis I: Psychotic Disorder NOS Axis II: Deferred Axis III:  Past Medical History  Diagnosis Date  . Rheumatoid arthritis   . Obesity   . Major depression     with acute psychotic break in 06/2010  . Diabetes mellitus   . Hypertension   . Hyperlipidemia   . Diverticulosis of colon   . COPD (chronic obstructive pulmonary disease)   . Anxiety   . GERD (gastroesophageal reflux disease)   . Vertigo   . Seizures   . Fibromyalgia   . Dermatomyositis     . Myocardial infarct     mulitple (1999, 2000, 2004)  . Raynaud's disease   . Narcotic dependence   . Peripheral neuropathy   . Internal hemorrhoids   . Ischemic heart disease   . Hiatal hernia   . Gastritis   . Diverticulitis   . Hx of adenomatous colonic polyps   . Nephrolithiasis   . Anemia   . Esophageal stricture   . Esophageal dysmotility   . Dermatomyositis   . Paroxysmal a-fib   . Urge incontinence   . Otosclerosis    Axis IV: other psychosocial or environmental problems, problems related to social environment and problems with primary support group Axis V: 21-30 behavior considerably influenced by delusions or hallucinations OR serious impairment in judgment, communication OR inability to function in almost all areas  Past Medical History:  Past Medical History  Diagnosis Date  . Rheumatoid arthritis   . Obesity   . Major depression     with acute psychotic break in 06/2010  . Diabetes mellitus   . Hypertension   . Hyperlipidemia   . Diverticulosis of colon   . COPD (chronic obstructive pulmonary disease)   . Anxiety   . GERD (gastroesophageal reflux disease)   . Vertigo   . Seizures   . Fibromyalgia   . Dermatomyositis   . Myocardial infarct  mulitple (1999, 2000, 2004)  . Raynaud's disease   . Narcotic dependence   . Peripheral neuropathy   . Internal hemorrhoids   . Ischemic heart disease   . Hiatal hernia   . Gastritis   . Diverticulitis   . Hx of adenomatous colonic polyps   . Nephrolithiasis   . Anemia   . Esophageal stricture   . Esophageal dysmotility   . Dermatomyositis   . Paroxysmal a-fib   . Urge incontinence   . Otosclerosis     Past Surgical History  Procedure Date  . Knee arthroscopy w/ meniscal repair 2009    left  . Lumbar disc surgery   . Squamous papilloma  2010    removed by Dr. Pollyann Kennedy ENT, noted on tongue    Family History:  Family History  Problem Relation Age of Onset  . Alcohol abuse Mother   . Depression  Mother   . Heart disease Mother   . Diabetes Mother   . Stroke Mother   . Diabetes Other     1/2 brother    Social History:  reports that he has been smoking.  He does not have any smokeless tobacco history on file. He reports that he drinks about .6 ounces of alcohol per week. He reports that he does not use illicit drugs.  Additional Social History:  Alcohol / Drug Use Pain Medications: see PTA meds list Prescriptions: see PTA meds list Over the Counter: see PTA meds list History of alcohol / drug use?:  (unable to assess - UDS negative & no etoh on board)  CIWA: CIWA-Ar BP: 114/71 mmHg Pulse Rate: 94  COWS:    Allergies:  Allergies  Allergen Reactions  . Betamethasone Dipropionate     Unknown  . Bupropion Hcl     Unknown  . Ciprofloxacin     REACTION: swelling  . Clobetasol     Unknown  . Codeine     Unknown  . Escitalopram Oxalate     Unknown  . Fluoxetine Hcl     Unknown  . Fluticasone-Salmeterol     Unknown  . Furosemide     Unknown  . Immune Globulins     Acute renal failure  . Metronidazole     REACTION: swelling  . Paroxetine     Unknown  . Penicillins     Unknown  . Statins     Unknown  . Tacrolimus     Unknown  . Tetanus Toxoid     Unknown  . Tuberculin Purified Protein Derivative     Unknown    Home Medications:  (Not in a hospital admission)  OB/GYN Status:  No LMP for male patient.  General Assessment Data Location of Assessment: [facility] ED Living Arrangements: Alone Can pt return to current living arrangement?: Yes Admission Status: Voluntary Is patient capable of signing voluntary admission?: No Transfer from: Home Referral Source: Other ([employer] brought him in after neighbors called)  Education Status Is patient currently in school?: No Highest grade of school patient has completed: unable to assess (un)  Risk to self Suicidal Ideation: No Suicidal Intent: No Is patient at risk for suicide?: No Suicidal Plan?:  (unable to  assess) Access to Means:  (unable to assess) What has been your use of drugs/alcohol within the last 12 months?: unable to assess Previous Attempts/Gestures:  (unable to assess) Other Self Harm Risks:  (unable to assess) Triggers for Past Attempts:  (unable to assess) Intentional Self Injurious Behavior:  (unable to  assess) Family Suicide History: Unable to assess Recent stressful life event(s):  (unable to assess) Persecutory voices/beliefs?:  (unable to assess) Depression:  (unable to assess) Depression Symptoms:  (unable to assess) Substance abuse history and/or treatment for substance abuse?:  (unable to assess) Suicide prevention information given to non-admitted patients: Not applicable  Risk to Others Homicidal Ideation:  (unable to assess) Thoughts of Harm to Others:  (unable to assess) Current Homicidal Intent:  (unable to assess) Current Homicidal Plan:  (unable to assess) Access to Homicidal Means:  (unable to assess) Identified Victim:  (unable to assess) History of harm to others?:  (unable to assess) Assessment of Violence: None Noted Violent Behavior Description: unable to assess Does patient have access to weapons?: Yes (Comment) (pt states he has a BB gun) Criminal Charges Pending?:  (unable to assess) Does patient have a court date:  (unable to assess)  Psychosis Hallucinations:  (unable to assess) Delusions: Grandiose  Mental Status Report Appear/Hygiene: Disheveled Eye Contact: Fair Motor Activity: Restlessness;Freedom of movement Speech: Loud;Tangential Level of Consciousness: Alert Mood: Other (Comment) (unable to assess) Affect: Anxious;Preoccupied Thought Processes: Flight of Ideas;Irrelevant;Tangential Judgement: Impaired Orientation: Not oriented Obsessive Compulsive Thoughts/Behaviors:  (unable to assess)  Cognitive Functioning Concentration:  (unable to assess) Memory:  (unable to assess) IQ:  (unable to assess) Insight: Poor Impulse  Control: Poor Appetite:  (unable to assess) Sleep:  (unable to assess) Vegetative Symptoms:  (unable to assess)  ADLScreening [facility] Assessment Services) Patient's cognitive ability adequate to safely complete daily activities?: No Patient able to express need for assistance with ADLs?: Yes (unable to assess) Independently performs ADLs?: No  Abuse/Neglect [facility]) Physical Abuse:  (unable to assess) Verbal Abuse:  (unable to assess) Sexual Abuse:  (unable to assess)  Prior Inpatient Therapy Prior Inpatient Therapy:  (unable to assess)  Prior Outpatient Therapy Prior Outpatient Therapy:  (unable to assess)  ADL Screening (condition at time of admission) Patient's cognitive ability adequate to safely complete daily activities?: No Patient able to express need for assistance with ADLs?: Yes (unable to assess) Independently performs ADLs?: No Weakness of Legs: Both  Home Assistive Devices/Equipment Home Assistive Devices/Equipment: Cane (specify quad or straight);Dentures (specify type);Eyeglasses;Insurance underwriter (specify type)    Abuse/Neglect Assessment (Assessment to be complete while patient is alone) Physical Abuse:  (unable to assess) Verbal Abuse:  (unable to assess) Sexual Abuse:  (unable to assess) Exploitation of patient/patient's resources:  (unable to assess) Self-Neglect:  (unable to assess) Values / Beliefs Cultural Requests During Hospitalization: None Spiritual Requests During Hospitalization: None   Advance Directives (For Healthcare) Advance Directive: Patient does not have advance directive    Additional Information 1:1 In Past 12 Months?:  (unable to assess - has 1:1 currently) CIRT Risk: No Elopement Risk: No Does patient have medical clearance?: Yes     Disposition:  Disposition Disposition of Patient: Inpatient treatment program (geri psych program) Type of inpatient treatment program: Adult  On Site Evaluation by:   Reviewed with  Physician:     Donnamarie Rossetti P 02/13/2012 3:40 AM

## 2012-02-13 NOTE — ED Notes (Signed)
Patient still having hyper religious thoughts continues to yell out that he can not have any plastic or metal in room. Patient does not want anyone with facial hair in his room current male  sitter switched out for male sitter.
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
## 2012-02-14 ENCOUNTER — Encounter (HOSPITAL_COMMUNITY): Payer: Medicare HMO

## 2012-02-14 DIAGNOSIS — F29 Unspecified psychosis not due to a substance or known physiological condition: Secondary | ICD-10-CM

## 2012-02-14 LAB — CBC
HCT: 38.7 % — ABNORMAL LOW (ref 39.0–52.0)
MCHC: 32.3 g/dL (ref 30.0–36.0)
RDW: 13.6 % (ref 11.5–15.5)

## 2012-02-14 LAB — BASIC METABOLIC PANEL
Chloride: 105 mEq/L (ref 96–112)
GFR calc non Af Amer: 61 mL/min — ABNORMAL LOW (ref >90)
Glucose, Bld: 84 mg/dL (ref 70–99)
Potassium: 3.6 mEq/L (ref 3.5–5.1)
Sodium: 146 mEq/L — ABNORMAL HIGH (ref 135–145)

## 2012-02-14 MED ORDER — COLLAGENASE 250 UNIT/GM EX OINT
TOPICAL_OINTMENT | Freq: Every day | CUTANEOUS | Status: DC
  Administered 2012-02-14 – 2012-02-21 (×7): via TOPICAL
  Filled 2012-02-14: qty 30
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
## 2012-02-14 NOTE — Progress Notes (Signed)
FMTS Attending Note Patient's case discussed with resident team, and I agree with assessment/plan.  Patient seen today by attending Dr Deirdre Priest. Paula Compton, MD

## 2012-02-14 NOTE — H&P (Signed)
Family Medicine Teaching Service Attending Note  I interviewed and examined patient Patrick Holt and reviewed their tests and x-rays.  I discussed with Dr. Aviva Signs and reviewed their note for today.  I agree with their assessment and plan.     Additionally  This am is awake alert calm and interactive Oriented to place and month can't say year Frequently tangential in his responses Moving all extrem equally PERRL  For EEG today Continue rate control for Afib Proceed as per psychiatry and neurology after EEG

## 2012-02-14 NOTE — Progress Notes (Signed)
LTVM EEG initiated. 
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
## 2012-02-14 NOTE — Progress Notes (Signed)
Subjective: Feels "fine."  Exam: Filed Vitals:   02/14/12 0606  BP: 91/50  Pulse: 65  Temp: 97.6 F (36.4 C)  Resp: 21   Gen: In bed, NAD MS: Awake, Alert, knows that he was at [facility] yesterday and is at [facility] today.  UJ:WJXBJ, EOMI Motor: MAEW Sensory:intact to LT.  Impression: 61 yo M with a history of psychotic episode in 2012 and recurrent episode. He had an extensive evaluation previously. There is a question of seizures, and the patient was started on Keppra previously. I advised a change to depakote as this can be mood stabilizing.   He has been admitted to rule out recurrent seizures contributing to AMS.   Labs: wbc decreased  Recommendations: 1)24 hour EEG.  2) Depakote 500mg  BID.  3) Would check level in a few days.   Ritta Slot, MD [facility] [phone]  If 7pm- 7am, please page neurology on call at [phone].

## 2012-02-15 DIAGNOSIS — F1027 Alcohol dependence with alcohol-induced persisting dementia: Secondary | ICD-10-CM | POA: Diagnosis present

## 2012-02-15 LAB — LEVETIRACETAM LEVEL: Levetiracetam Lvl: <5 ug/mL — ABNORMAL LOW (ref 5.0–30.0)

## 2012-02-15 NOTE — Progress Notes (Signed)
Patient ID: Patrick Holt, male   DOB: Oct 10, 1950, 61 y.o.   MRN: [id number] Family Medicine Teaching Service Daily Progress Note Service Page: [phone]  Patient Assessment: patient is a 61 yo male who presents following being found naked in his yard with a BB gun and has psychosis like symptoms with tangential thinking and perseveration on certain topics  Subjective: Patient continues to have tangential thinking and perseveration. Focused on being able to use phone to call creditors to pay bills.  Continues to perseverate on metal in the room and it being the cause of his medical issues.  Objective: Temp:  [98.2 F (36.8 C)-98.5 F (36.9 C)] 98.3 F (36.8 C) (12/31 0942) Pulse Rate:  [56-89] 56  (12/31 0942) Resp:  [18-22] 18  (12/31 0942) BP: (93-118)/(58-72) 93/67 mmHg (12/31 0942) SpO2:  [90 %-98 %] 98 % (12/31 0942) Exam: General: no acute distress, eating breakfast in bed Cardiovascular: regular rate and rhythm, no murmurs Respiratory: clear to auscultation anteriorly Psych: tangential thought process, perseveration on variety of topics, alert and oriented x3 Skin: 2x2 cm wound on proximal right tibia, bandaged with with santyl ointment, no erythema or drainage apparent.  I have reviewed the patient's medications, labs, imaging, and diagnostic testing.  Notable results are summarized below.  CBC BMET   Lab 02/14/12 0954 02/12/12 1904 02/09/12 0243  WBC 6.9 14.6* 10.8*  HGB 12.5* 15.8 15.6  HCT 38.7* 46.4 46.2  PLT 293 365 241    Lab 02/14/12 0954 02/13/12 0150 02/12/12 1904 02/09/12 0243  NA 146* -- 139 141  K 3.6 3.2* 2.6* --  CL 105 -- 94* 95*  CO2 29 -- 29 36*  BUN 22 -- 16 25*  CREATININE 1.24 -- 0.89 1.52*  GLUCOSE 84 -- 99 121*  CALCIUM 9.2 -- 9.4 9.7     Imaging/Diagnostic Tests: EEG in process  Plan: Patrick Holt is a 61 y.o. year old male with a history of previous psychotic episode in 2012, seizures, anxiety, depression, afib, and DM II  presenting from [facility] ED as a transfer after being found by police walking naked in the road with a BB gun.   1. Neuro/Psych: patient with previous history of seizures and psychotic episode presenting with psychosis like behaviors and thought process. Likely in psychotic state, though with seizure history need to rule-out seizure activity -continue depakote-will check depakote level in a couple of days -follow-up EEG results and neurology recs -psych contacted and is to see the patient today-will need to follow-up with their further recs -psych recs inpatient geriatric psych -tremor-continue home ropinirole  -continue home trazodone, cymbalta, and prn xanax   2. CV: patient with history of afib, HTN.  -afib: continue amiodarone taper per recent discharge summary 400mg  Q8 x 7 days then 400mg  BID x14 days then 400mg  Qday x14 days then 200mg  Qday -place patient on cardiac monitoring-remains in normal sinus rhythm -HTN: BP currently low in the 90's/60's. Continues to be stable at this level  -will hold home lopressor, lisinopril, and HCTZ given low BP  -will hold home torsemide due to low BP-patient has not been taking this per epic Documentation  -continue to follow vitals   3. Pulm: patient with history of COPD. No respiratory complaints at this time.  -continue home combivent  -nicotine replacement patch   4. Endo: patient with history of DM II. Most recent HbA1c 5.5.  -continue home glipizide   5. FEN/GI: patient with history of IBS and GERD, also with hypokalemia  -  continue home Bentyl for IBS  -protonix for GERD  -patient with hypokalemia-will continue to replete with daily k-dur tablet -BMP tomorrow am -continue home vitamin B1  -zofran prn for nausea  -lomotil prn for diarrhea  -continue folic acid, magnesium oxide   6. ID: patient with recent history of right proximal tibia cellulitis, appears to be healing lesion at this time.Also with elevated WBC to 14.6 at admission, now 6.9.  Currently afebrile. No signs of systemic infection to point to cause of elevated WBC.  -will continue doxycyline for full 8 day course.  -CBC in am  -wound care recs continuing santyl ointment.   7. 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  - will hold on prednisone due to acute pt psychosis. Methotrexate is once a week, per nursing he may not be taking this as well.  Prophylaxis: SCDs  Disposition: pending EEG and neurology evaluation, in addition to psychiatric placement Code Status: full   Marikay Alar, MD 02/15/2012, 10:37 AM

## 2012-02-15 NOTE — Progress Notes (Signed)
FMTS Attending Note Patient seen and examined by me, discussed with resident team and I agree with their assessment and plan.  Patient is quite tangential in his thinking today.  Perseverating on calling telephone company about unpaid bills.  24hr EEG concluding today.  Appreciate Neuro input.  I agree with need for Psychiatry assessment in assessment of this patient for assessment of management of his psychosis. Paula Compton, MD

## 2012-02-15 NOTE — Progress Notes (Signed)
Clinical Social Work Department CLINICAL SOCIAL WORK PSYCHIATRY SERVICE LINE ASSESSMENT 02/15/2012  Patient:  Patrick Holt  Account:  [id number]  Admit Date:  02/12/2012  Clinical Social Worker:  Unk Lightning, LCSW  Date/Time:  02/15/2012 12:00 N Referred by:  Physician  Date referred:  02/15/2012 Reason for Referral  Behavioral Health Issues   Presenting Symptoms/Problems (In the person's/family's own words):   Psych consulted due to patient found naked carrying BB gun around backyard   Abuse/Neglect/Trauma History (check all that apply)  Denies history   Abuse/Neglect/Trauma Comments:   Psychiatric History (check all that apply)  Outpatient treatment   Psychiatric medications:  Depakote and Cymbalta   Current Mental Health Hospitalizations/Previous Mental Health History:   Patient recently quit going to current provider for medication management. Patient recently dc from hospital but did not follow through with follow up appointment at new agency.   Current provider:   N/A   Place and Date:   N/A   Current Medications:   acetaminophen, ALPRAZolam, alum & mag hydroxide-simeth, diphenoxylate-atropine, ibuprofen, Ipratropium-Albuterol, nicotine, ondansetron                        . amiodarone  200 mg Oral Daily  . amiodarone  400 mg Oral BID  . amiodarone  400 mg Oral Daily  . aspirin EC  81 mg Oral Daily  . collagenase   Topical Daily  . dicyclomine  20 mg Oral BID  . divalproex  500 mg Oral Q12H  . doxycycline  100 mg Oral Q12H  . DULoxetine  60 mg Oral BID  . folic acid  1 mg Oral Daily  . glipiZIDE  5 mg Oral QAC breakfast  . magnesium oxide  400 mg Oral Daily  . niacin  1,000 mg Oral QHS  . oxybutynin  5 mg Oral TID  . pantoprazole  80 mg Oral Q1200  . potassium chloride SA  20 mEq Oral Daily  . rOPINIRole  2 mg Oral TID  . thiamine  100 mg Oral Daily  . traZODone  50 mg Oral QHS   Previous Impatient Admission/Date/Reason:   None reported    Emotional Health / Current Symptoms    Suicide/Self Harm  None reported   Suicide attempt in the past:   Other harmful behavior:   Psychotic/Dissociative Symptoms  None reported   Other Psychotic/Dissociative Symptoms:    Attention/Behavioral Symptoms  Inattentive   Other Attention / Behavioral Symptoms:    Cognitive Impairment  Within Normal Limits   Other Cognitive Impairment:    Mood and Adjustment  Anxious    Stress, Anxiety, Trauma, Any Recent Loss/Stressor  Relationship   Anxiety (frequency):   Phobia (specify):   Compulsive behavior (specify):   Obsessive behavior (specify):   Other:   Lacking social support.   Substance Abuse/Use  Current substance use   SBIRT completed (please refer for detailed history):  Y  Self-reported substance use:   Patient appears to minimize use and changes the amount he drinks during assessment. Patient reports that he drinks a glass of wine each day. Patient reports that he sometimes has a beer socially.   Urinary Drug Screen Completed:  Y Alcohol level:   <11    Environmental/Housing/Living Arrangement  Stable housing   Who is in the home:   Alone   Emergency contact:  Sharon-friend   Financial  Medicare   Patient's Strengths and Goals (patient's own words):   Patient unable to state  Clinical Social Worker's Interpretive Summary:   CSW and psych MD met with patient. Patient reports he does not remember incident that prompted admission. Patient states that he felt he was not doing well and that a friend called 911 for him.    Psych MD completed MSE with patient. Patient struggled with participating in asssessment. Patient gave tangential information and struggled with staying focused on topic.    Patient reports that he does not drink alcohol but later stated that he drinks a glass of wine a day and then later stated that he drinks beer when guests come over. Patient reports he does not remember if he was  drinking on day of admission.    Patient was diagnosed with depression several years ago. Patient struggles with following through with appointments and recently stopped going to provider. Patient did not follow up with scheduled appointment from hospital. Patient reports he has been compliant with medications at home.    Psych MD requests patient being transferred to inpatient psych hopsital. CSW sent referrals after patient signed ROI. CSW will continue to follow.   Disposition:  Inpatient referral made [facility], [facility], [place])

## 2012-02-15 NOTE — Progress Notes (Signed)
Chart review complete.  Patient is not eligible for [facility] services at this time because his admitting and primary diagnoses do not meet admission criteria.  Of note, [facility] Care Management services does not replace or interfere with any services that are arranged by inpatient case management or social work.  For additional questions or referrals please contact Anibal Henderson BSN RN [facility] Liaison at [phone].

## 2012-02-15 NOTE — Procedures (Signed)
History: 61 yo M with altered mental status and concern for spells of unresponsiveness.   This continuous EEG recorded from 10:48 am on 12/29 until 9:44 am on 12/30  Background: There is a well defined posterior dominant rhythm of 10 Hz that attenuates with eye opening. There is prominant bifrontally predominant beta activity present throughout the recording. There is irregular generalized delta activity associated with periods of drowsiness. Sleep structures including spindles and k-complexes were seen.   EEG Diagnosis:1) Excessive beta activity  Clinical Interpretation: This normal EEG is recorded in the waking and sleep state. The beta activity seen is medication effect, as can be seen with benzodiazepine. There was no seizure or seizure predisposition recorded on this study.   Ritta Slot, MD [facility] [phone]  If 7pm- 7am, please page neurology on call at [phone].

## 2012-02-15 NOTE — Consult Note (Addendum)
Patient Identification:  Patrick Holt Date of Evaluation:  02/15/2012 Reason for Consult:  Pt found in road, nude with BB gun  Referring Provider: Dr. Birdie Sons  History of Present Illness:Pt was found walking nude down the road  carrying a BB gun. He was transferred from [facility] ED. He is unable to explain what was happening.  Past Psychiatric History  patient was recently admitted to [facility] with apparent seizure disorder. There was some effort to detox from alcohol.  He was discharged to home and now returns again more confused than he was during the last admission.  Tobacco dependence; suspect alcohol abuse.  Past Medical History:     Past Medical History  Diagnosis Date  . Rheumatoid arthritis   . Obesity   . Major depression     with acute psychotic break in 06/2010  . Diabetes mellitus   . Hypertension   . Hyperlipidemia   . Diverticulosis of colon   . COPD (chronic obstructive pulmonary disease)   . Anxiety   . GERD (gastroesophageal reflux disease)   . Vertigo   . Seizures   . Fibromyalgia   . Dermatomyositis   . Myocardial infarct     mulitple (1999, 2000, 2004)  . Raynaud's disease   . Narcotic dependence   . Peripheral neuropathy   . Internal hemorrhoids   . Ischemic heart disease   . Hiatal hernia   . Gastritis   . Diverticulitis   . Hx of adenomatous colonic polyps   . Nephrolithiasis   . Anemia   . Esophageal stricture   . Esophageal dysmotility   . Dermatomyositis   . Paroxysmal a-fib   . Urge incontinence   . Otosclerosis        Past Surgical History  Procedure Date  . Knee arthroscopy w/ meniscal repair 2009    left  . Lumbar disc surgery   . Squamous papilloma  2010    removed by Dr. Pollyann Kennedy ENT, noted on tongue    Allergies:  Allergies  Allergen Reactions  . Betamethasone Dipropionate     Unknown  . Bupropion Hcl     Unknown  . Ciprofloxacin     REACTION: swelling  . Clobetasol     Unknown  . Codeine     Unknown    . Escitalopram Oxalate     Unknown  . Fluoxetine Hcl     Unknown  . Fluticasone-Salmeterol     Unknown  . Furosemide     Unknown  . Immune Globulins     Acute renal failure  . Metronidazole     REACTION: swelling  . Paroxetine     Unknown  . Penicillins     Unknown  . Statins     Unknown  . Tacrolimus     Unknown  . Tetanus Toxoid     Unknown  . Tuberculin Purified Protein Derivative     Unknown    Current Medications:  Prior to Admission medications   Medication Sig Start Date End Date Taking? Authorizing Provider  albuterol-ipratropium (COMBIVENT) 18-103 MCG/ACT inhaler Inhale 2 puffs into the lungs every 4 (four) hours as needed. For shortness of breath/wheeze    Yes Historical Provider, MD  ALPRAZolam Prudy Feeler) 1 MG tablet Take 1 mg by mouth 3 (three) times daily as needed. For anxiety   Yes Historical Provider, MD  amiodarone (PACERONE) 200 MG tablet Take 2 tablets (400 mg total) by mouth 2 (two) times daily. 02/16/12 03/01/12 Yes Twana First  Hess, DO  amiodarone (PACERONE) 200 MG tablet Take 2 tablets (400 mg total) by mouth daily. 03/02/12 03/16/12 Yes Bryan R Hess, DO  amiodarone (PACERONE) 200 MG tablet Take 1 tablet (200 mg total) by mouth daily. 03/17/12  Yes Bryan R Hess, DO  amiodarone (PACERONE) 400 MG tablet Take 1 tablet (400 mg total) by mouth every 8 (eight) hours. 02/08/12 02/15/12 Yes Bryan R Hess, DO  aspirin EC 81 MG tablet Take 81 mg by mouth daily.   Yes Historical Provider, MD  diclofenac (VOLTAREN) 75 MG EC tablet Take 1 tablet (75 mg total) by mouth 2 (two) times daily as needed. For back pain 09/07/11 09/06/12 Yes Tobey Grim, MD  dicyclomine (BENTYL) 20 MG tablet Take 1 tablet (20 mg total) by mouth 2 (two) times daily. 12/08/11  Yes Hart Carwin, MD  diphenoxylate-atropine (LOMOTIL) 2.5-0.025 MG per tablet Take 1 tablet by mouth 4 (four) times daily as needed for diarrhea or loose stools. 10/07/11 10/06/12 Yes Andrena Mews, DO  DULoxetine (CYMBALTA) 60  MG capsule Take 60 mg by mouth 2 (two) times daily.    Yes Historical Provider, MD  esomeprazole (NEXIUM) 40 MG capsule Take 1 capsule (40 mg total) by mouth daily before breakfast. 12/08/11  Yes Hart Carwin, MD  folic acid (FOLVITE) 1 MG tablet Take 1 mg by mouth daily.   Yes Historical Provider, MD  glipiZIDE (GLUCOTROL XL) 5 MG 24 hr tablet Take 5 mg by mouth daily.   Yes Historical Provider, MD  levETIRAcetam (KEPPRA) 500 MG tablet Take 1 tablet (500 mg total) by mouth 2 (two) times daily. 02/08/12  Yes Bryan R Hess, DO  lisinopril-hydrochlorothiazide (PRINZIDE,ZESTORETIC) 10-12.5 MG per tablet Take 1 tablet by mouth daily. 10/07/11 10/06/12 Yes Andrena Mews, DO  magnesium oxide (MAG-OX) 400 MG tablet Take 400 mg by mouth daily.   Yes Historical Provider, MD  metoprolol (LOPRESSOR) 50 MG tablet Take 1 tablet (50 mg total) by mouth 2 (two) times daily. 12/08/11  Yes Andrena Mews, DO  niacin (NIASPAN) 1000 MG CR tablet Take 1,000 mg by mouth at bedtime.   Yes Historical Provider, MD  nitroGLYCERIN (NITROSTAT) 0.4 MG SL tablet Place 0.4 mg under the tongue every 5 (five) minutes as needed. For chest pain   Yes Historical Provider, MD  oxybutynin (DITROPAN) 5 MG tablet Take 1 tablet (5 mg total) by mouth 3 (three) times daily. 12/10/11 12/09/12 Yes Andrena Mews, DO  potassium chloride SA (K-DUR,KLOR-CON) 20 MEQ tablet Take 1 tablet (20 mEq total) by mouth daily. 08/23/11  Yes Durwin Reges, MD  predniSONE (DELTASONE) 5 MG tablet Take 1 tablet (5 mg total) by mouth daily. 10/07/11  Yes Andrena Mews, DO  rOPINIRole (REQUIP) 2 MG tablet Take 2 mg by mouth 3 (three) times daily as needed. For tremors   Yes Historical Provider, MD  thiamine (VITAMIN B-1) 100 MG tablet Take 100 mg by mouth daily.   Yes Historical Provider, MD  torsemide (DEMADEX) 10 MG tablet Take 1 tablet (10 mg total) by mouth daily. 12/10/11  Yes Andrena Mews, DO  traZODone (DESYREL) 50 MG tablet Take 1 tablet (50 mg total)  by mouth at bedtime. 02/08/12  Yes Bryan R Hess, DO  doxycycline (VIBRA-TABS) 100 MG tablet Take 1 tablet (100 mg total) by mouth every 12 (twelve) hours. 02/08/12   Twana First Hess, DO  methotrexate (RHEUMATREX) 2.5 MG tablet Take 3 tablets (7.5 mg total) by mouth once  a week. 12/10/11   Andrena Mews, DO    Social History:    reports that he has been smoking.  He does not have any smokeless tobacco history on file. He reports that he drinks about .6 ounces of alcohol per week. He reports that he does not use illicit drugs.   Family History:    Family History  Problem Relation Age of Onset  . Alcohol abuse Mother   . Depression Mother   . Heart disease Mother   . Diabetes Mother   . Stroke Mother   . Diabetes Other     1/2 brother    Mental Status Examination/Evaluation: Objective:  Appearance: Disheveled Environmental consultant::  Good  Speech:  Clear and Coherent and Normal Rate  Volume:  Normal  Mood:  Confused   Affect:  Blunt  Thought Process:  Circumstantial, Irrelevant and Loose  Orientation:  Other:  Patient fails to provide adequate information to confirm stable mental status. It is unknown if there is confabulation but he is extremely focused on providing minutia about everything he is thinking about. Therefore it is not possible to reality test for his comments in description.  Thought Content:  Rumination and Disorganized stream of consciousness  Suicidal Thoughts:  No  Homicidal Thoughts:  No  Judgement:  Impaired  Insight:  Lacking   DIAGNOSIS:   AXIS I   rule out delirium versus alcoholic dementia versus confabulation   AXIS II  Deferred  AXIS III See medical notes.  AXIS IV other psychosocial or environmental problems, problems related to social environment and history of alcohol use however, he states that he keeps a teeth appear in the house "for his X.-mother-in-law who comes to help him"  AXIS V 41-50 serious symptoms   Assessment/Plan:  Discussed with  Dr. Ermalinda Memos [facility] CSW Patient is sitting in bed. He recognizes M.D. He begins to explain the opening question of why he is here. The following monologue is lengthy, tangential, unrelated to the initial question and specifically non-disclosing about his symptoms, mood or thoughts. He is unable to engage in a mental status exam. His comments range from activities in his yard to his rooms to his ex-mother-in-law to comments about birds, he is wheelchair (which is never been obvious and prior discussions) and evasive comments when asked specifically about drug use or alcohol. RECOMMENDATION:  1. Patient does not have capacity to take charge of his medical treatment. He depends on the ex.-mother-in-law whom he says befriends him 2.  Corroboration history is important. 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  It is also noted that the MRI displays microvascular infarcts and atrophy as well as to small subdural and, subarachnoid hematomas. 3.  Suggest patient be transferred when medically stable to [facility] or comparable facility. 4. No further psychiatric needs unless requested. M.D. psychiatrist signs off Mickeal Skinner MD 02/15/2012 12:14 PM

## 2012-02-15 NOTE — Progress Notes (Signed)
No seizures seen on EEG and therefore I do not think that seizure is contributing to his mental status. No spells recorded so still cannot rule out a seizure disorder with this study. Would continue Depakote at the current dose, check a level in 2 - 3 days. He will need neurology follow up as an outpatient.   At this time, no further workup from a neurological perspective. Neurology will sign off.   Ritta Slot, MD [facility] [phone]  If 7pm- 7am, please page neurology on call at [phone].

## 2012-02-15 NOTE — Progress Notes (Signed)
Subjective: No button presses overnight, though patient reports a "seizure" though it is unclear what he means by this.    Exam: Filed Vitals:   02/15/12 0942  BP: 93/67  Pulse: 56  Temp: 98.3 F (36.8 C)  Resp: 18   Gen: In bed, NAD MS: Awake, Alert, Oriented NW:GNFAO, EOMI Motor: 5/5 throughout Sensory:intact to LT  Impression: 61 yo M with a history of psychotic episode in 2012 and recurrent episode. He had an extensive evaluation previously. There is a question of seizures, and the patient was started on Keppra previously. I advised a change to depakote as this can be mood stabilizing. I still question what was seen and felt to be a seizure, but it is certainly possible and therefore would continue AED therapy.   Recommendations: 1) F/U results of EEG, if negative for seizures, then I do not feel that recurrent seizures could be responsible for his current behavior.  2) Agree with psychiatry consultation.   Ritta Slot, MD [facility] [phone]  If 7pm- 7am, please page neurology on call at [phone].

## 2012-02-16 ENCOUNTER — Encounter (HOSPITAL_COMMUNITY): Payer: Self-pay | Admitting: General Practice

## 2012-02-16 LAB — BASIC METABOLIC PANEL
CO2: 29 mEq/L (ref 19–32)
Calcium: 9.3 mg/dL (ref 8.4–10.5)
Glucose, Bld: 91 mg/dL (ref 70–99)
Potassium: 3.5 mEq/L (ref 3.5–5.1)
Sodium: 145 mEq/L (ref 135–145)

## 2012-02-16 LAB — CBC
Hemoglobin: 13.7 g/dL (ref 13.0–17.0)
MCH: 33.7 pg (ref 26.0–34.0)
RBC: 4.07 MIL/uL — ABNORMAL LOW (ref 4.22–5.81)

## 2012-02-16 NOTE — Progress Notes (Signed)
Patient ID: Patrick Holt, male   DOB: November 16, 1950, 62 y.o.   MRN: [id number] Family Medicine Teaching Service Daily Progress Note Service Page: [phone]  Patient Assessment: patient is a 62 yo male who presents following being found naked in his yard with a BB gun and has psychosis like symptoms with tangential thinking and perseveration on certain topics  Subjective: Patient continues to have tangential thinking and perseveration. Continues to be focused on being able to pay bills.   Was able to discuss establishing care with outside physicians.  Reports leaving [facility] due to being mad at myself for referring him to Pain Management.  Denies actually being seen by those physicians.  Reports he was accosted by their provider while trying to provide urine sample for drug testing.  Thought content continues to be tangential, disorganized and nonsensical.    Objective: Temp:  [98.1 F (36.7 C)-98.9 F (37.2 C)] 98.1 F (36.7 C) (01/01 0612) Pulse Rate:  [56-81] 81  (01/01 0612) Resp:  [18] 18  (01/01 0612) BP: (93-121)/(67-75) 121/74 mmHg (01/01 0612) SpO2:  [92 %-98 %] 95 % (01/01 0612) Weight:  [183 lb 10.3 oz (83.3 kg)] 183 lb 10.3 oz (83.3 kg) (01/01 0612) Exam: General: no acute distress, eating breakfast in bed Cardiovascular: regular rate and rhythm, no murmurs Respiratory: clear to auscultation anteriorly Psych: tangential thought process, perseveration on variety of topics, alert and oriented x3 Skin: 2x2 cm wound on proximal right tibia, bandaged with with santyl ointment, no erythema or drainage apparent.  I have reviewed the patient's medications, labs, imaging, and diagnostic testing.  Notable results are summarized below.  CBC BMET   Lab 02/16/12 0615 02/14/12 0954 02/12/12 1904  WBC 6.4 6.9 14.6*  HGB 13.7 12.5* 15.8  HCT 42.6 38.7* 46.4  PLT 303 293 365    Lab 02/16/12 0615 02/14/12 0954 02/13/12 0150 02/12/12 1904  NA 145 146* -- 139    K 3.5 3.6 3.2* --  CL 105 105 -- 94*  CO2 29 29 -- 29  BUN 15 22 -- 16  CREATININE 0.90 1.24 -- 0.89  GLUCOSE 91 84 -- 99  CALCIUM 9.3 9.2 -- 9.4     Imaging/Diagnostic Tests: EEG negative  Plan: Patrick Holt is a 62 y.o. year old male with a history of previous psychotic episode in 2012, seizures, anxiety, depression, afib, and DM II presenting from [facility] ED as a transfer after being found by police walking naked in the road with a BB gun.   1. 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 Likely in psychotic state, though with seizure history need to rule-out seizure activity -continue depakote-will check depakote level tomorrow - EEG non-revealing, continue Depakote, Neuro signed off - PSYCH - acute psychosis likely due to [facility], needs geri-psych vs dementia-psych placement; does not have capacity -tremor-continue home ropinirole  -continue home trazodone, cymbalta, and prn xanax  -Niacin & Folate daily -CIWA  2. CV: patient with history of afib, HTN.  -afib: continue amiodarone taper per recent discharge summary 400mg  Q8 x 7 days then 400mg  BID x14 days then 400mg  Qday x14 days then 200mg  Qday -place patient on cardiac monitoring-remains in normal sinus rhythm -HTN: appropriate today - consider restarting home lopressor and/or lisinopril if BP elevates -will hold home HCTZ and torsemide due to low BP-patient has not been taking this per  epic Documentation  -continue to follow vitals   3. Pulm: patient with history of COPD. No respiratory complaints at this time.  -continue home combivent  -nicotine replacement patch   4. Endo: patient with history of DM II. Most recent HbA1c 5.5.  -continue home glipizide   5. FEN/GI: patient with history of IBS and GERD, also with hypokalemia  -continue  home Bentyl for IBS  -protonix for GERD  -patient with hypokalemia-will continue to replete with daily k-dur tablet -BMP tomorrow am -continue home vitamin B1  -zofran prn for nausea  -lomotil prn for diarrhea  -continue folic acid, magnesium oxide   6. ID: patient with recent history of right proximal tibia cellulitis, appears to be healing lesion at this time.Also with elevated WBC to 14.6 at admission, now 6.9. Currently afebrile. No signs of systemic infection to point to cause of elevated WBC.  -will continue doxycyline for full 8 day course.  -CBC in am  -wound care recs continuing santyl ointment.   7. 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  - will hold on prednisone due to acute pt psychosis. Methotrexate is once a week, per nursing he may not be taking this as well.  Prophylaxis: SCDs  Disposition: pending EEG and neurology evaluation, in addition to psychiatric placement Code Status: full   Andrena Mews, DO [facility] Family [facility] Resident - PGY-2 02/16/2012 9:42 AM

## 2012-02-16 NOTE — Progress Notes (Signed)
FMTS Attending Daily Note: Patrick Levy MD [phone] pager office [phone] I  have seen and examined this patient, reviewed their chart. I have discussed this patient with the resident. I agree with the resident's findings, assessment and care plan. Tangential and perseverative thoughts. I do not see how he has been living by himself. He denies episode leading up to admission. Agree with geriatric psych unit placement. We will need to continue sitter as well.

## 2012-02-17 LAB — GLUCOSE, CAPILLARY: Glucose-Capillary: 144 mg/dL — ABNORMAL HIGH (ref 70–99)

## 2012-02-17 MED ORDER — HALOPERIDOL LACTATE 5 MG/ML IJ SOLN
INTRAMUSCULAR | Status: AC
  Filled 2012-02-17: qty 1

## 2012-02-17 MED ORDER — HALOPERIDOL LACTATE 5 MG/ML IJ SOLN
2.5000 mg | Freq: Once | INTRAMUSCULAR | Status: AC
  Administered 2012-02-17: 2.5 mg via INTRAVENOUS

## 2012-02-17 NOTE — Progress Notes (Signed)
Patient ID: Patrick Holt, male   DOB: 1950-06-14, 62 y.o.   MRN: [id number]  Family Medicine Teaching Service Daily Progress Note Service Page: [phone]  Patient Assessment: patient is a 62 yo male who presents following being found naked in his yard with a BB gun and has psychosis like symptoms with tangential thinking and perseveration on certain topics  Subjective:    No complaints; denies chest pain or palpitations; tangential pressured speech with perseveration on bill pay   Objective: Temp:  [97.9 F (36.6 C)-98.4 F (36.9 C)] 98.4 F (36.9 C) (01/02 0952) Pulse Rate:  [72-90] 90  (01/02 0952) Resp:  [18-20] 18  (01/02 0952) BP: (121-148)/(63-82) 123/72 mmHg (01/02 0952) SpO2:  [92 %-99 %] 92 % (01/02 0952) Weight:  [183 lb 13.8 oz (83.4 kg)] 183 lb 13.8 oz (83.4 kg) (01/01 2112)  Exam: General: mild emotional distress, elderly, chronically ill appearing  Cardiovascular: regular rate and rhythm, no murmurs Respiratory: clear to auscultation anteriorly Psych: tangential thought process, perseveration on variety of topics, alert and oriented x3 Skin: 2x2 cm wound on proximal right tibia, bandaged with with santyl ointment, no erythema or drainage apparent.  I have reviewed the patient's medications, labs, imaging, and diagnostic testing.  Notable results are summarized below.  CBC BMET   Lab 02/16/12 0615 02/14/12 0954 02/12/12 1904  WBC 6.4 6.9 14.6*  HGB 13.7 12.5* 15.8  HCT 42.6 38.7* 46.4  PLT 303 293 365    Lab 02/16/12 0615 02/14/12 0954 02/13/12 0150 02/12/12 1904  NA 145 146* -- 139  K 3.5 3.6 3.2* --  CL 105 105 -- 94*  CO2 29 29 -- 29  BUN 15 22 -- 16  CREATININE 0.90 1.24 -- 0.89  GLUCOSE 91 84 -- 99  CALCIUM 9.3 9.2 -- 9.4     Imaging/Diagnostic Tests: EEG negative    Plan: Patrick Holt is a 62 y.o. year old male with a history of previous psychotic episode in 2012, seizures, anxiety, depression, afib, and DM II presenting from [facility] ED as  a transfer after being found by police walking naked in the road with a BB gun.   1. 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 Likely in psychotic state, though with seizure history need to rule-out seizure activity - Seizure Disorder: EEG non-revealing, continue Depakote, level normal on 02/17/11 - Acute psychosis: likely due to [facility], seen by psychiatrist Dr. Ferol Luz who recommended placement when medically stable, which he now is medically stable - Tremor- HOLD HOME ROPINIROLE as this may be activating for psychosis  -continue home trazodone, cymbalta, and prn xanax  -Niacin & Folate daily -CIWA  2. CV: patient with history of afib, HTN.  -afib: continue amiodarone taper per recent discharge summary 400mg  Q8 x 7 days then 400mg  BID x14 days then 400mg  Qday x14 days then 200mg  Qday -place patient on cardiac monitoring-remains in normal sinus rhythm -HTN: appropriate today - consider restarting home lopressor and/or lisinopril if BP elevates -will hold home HCTZ and torsemide due to low BP-patient has not been taking this per epic Documentation  -continue to follow vitals   3. Pulm: patient with history of COPD. No respiratory complaints at this time.  -continue home combivent  -nicotine replacement patch   4. Endo: patient with history of DM II. Most recent HbA1c 5.5.  -continue home glipizide   5.  FEN/GI: patient with history of IBS and GERD, also with hypokalemia  -continue home Bentyl for IBS  -protonix for GERD  -patient with hypokalemia-will continue to replete with daily k-dur tablet -BMP tomorrow am -continue home vitamin B1  -zofran prn for nausea  -lomotil prn for diarrhea  -continue folic acid, magnesium oxide   6. ID: patient with recent history of right proximal tibia cellulitis,  appears to be healing lesion at this time.Also with elevated WBC to 14.6 at admission, now 6.9. Currently afebrile. No signs of systemic infection to point to cause of elevated WBC.  -will continue doxycyline for full 8 day course; final day 02/21/11 -CBC in am  -wound care recs continuing santyl ointment.   7. 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  - will hold on prednisone due to acute pt psychosis. Methotrexate is once a week, per nursing he may not be taking this as well.  Prophylaxis: SCDs  Disposition: waiting placement and appreciate assistance of Psych social work  Code Status: full   Si Raider. Clinton Sawyer, MD, MBA 02/17/2012, 10:14 AM Family Medicine Resident, PGY-2 [phone] pager

## 2012-02-17 NOTE — Clinical Social Work Note (Signed)
Clinical Social Worker continuing to follow patient for support and discharge planning needs.  Per report, patient had already been referred to [facility], however upon phone call to check status, no referral currently on file.  CSW initiated a new referral to [facility] and will await to hear if patient qualifies for placement.  CSW remains available for support as needed.  Macario Golds, LCSW [phone]  [facility] [place], [phone])

## 2012-02-17 NOTE — Progress Notes (Signed)
FMTS Attending Daily Note: Sara Neal MD [phone] pager office [phone] I  have seen and examined this patient, reviewed their chart. I have discussed this patient with the resident. I agree with the resident's findings, assessment and care plan. 

## 2012-02-18 LAB — BASIC METABOLIC PANEL
BUN: 15 mg/dL (ref 6–23)
CO2: 30 mEq/L (ref 19–32)
Chloride: 105 mEq/L (ref 96–112)
Glucose, Bld: 59 mg/dL — ABNORMAL LOW (ref 70–99)
Potassium: 4 mEq/L (ref 3.5–5.1)

## 2012-02-18 LAB — CBC
HCT: 43.3 % (ref 39.0–52.0)
Hemoglobin: 13.6 g/dL (ref 13.0–17.0)
WBC: 5.9 10*3/uL (ref 4.0–10.5)

## 2012-02-18 NOTE — Clinical Social Work Note (Signed)
Clinical Social Worker continuing to follow patient for placement to inpatient psych.  CSW initiated referral to both [facility] and [facility] - [facility] is out of network for patient insurance [facility]) and patient would be required to pay out of pocket.  Patient remains on the waiting list at [facility] for the [facility] hall for higher acuity patients.  CSW spoke with Fannie Knee in assessment at [facility] who confirmed that patient would remain on the board and be re-evaluated once bed available beginning next week.  CSW relayed message to RN about placement status.  CSW remains available for emotional support and to facilitate patient discharge needs once bed available.  Macario Golds, [place] [phone]

## 2012-02-18 NOTE — Progress Notes (Signed)
Family Medicine Teaching Service Daily Progress Note Service Page: [phone]  Subjective:  Patient ready to go home, Explains that he is convinced he was not naked in his yard with a BB gun. Says that his mood is irritated and that he really wants to go home. No chest pain or dyspnea.   Objective: Temp:  [97.2 F (36.2 C)-98.5 F (36.9 C)] 97.2 F (36.2 C) (01/03 0510) Pulse Rate:  [68-90] 68  (01/03 0510) Resp:  [16-18] 18  (01/03 0510) BP: (117-143)/(72-87) 143/77 mmHg (01/03 0510) SpO2:  [92 %-96 %] 96 % (01/03 0510) Exam: General: mild emotional distress, elderly, chronically ill appearing, stooped over his table Cardiovascular: RRR, No murmur Respiratory: CTAB, Non labored  Psych: tangential thought process, perseveration on variety of topics, alert and oriented x3    I have reviewed the patient's medications, labs, imaging, and diagnostic testing.  Notable results are summarized below.  CBC BMET   Lab 02/18/12 0545 02/16/12 0615 02/14/12 0954  WBC 5.9 6.4 6.9  HGB 13.6 13.7 12.5*  HCT 43.3 42.6 38.7*  PLT 287 303 293    Lab 02/18/12 0545 02/16/12 0615 02/14/12 0954  NA 146* 145 146*  K 4.0 3.5 3.6  CL 105 105 105  CO2 30 29 29   BUN 15 15 22   CREATININE 1.03 0.90 1.24  GLUCOSE 59* 91 84  CALCIUM 9.4 9.3 9.2       Plan: Patrick Holt is a 62 y.o. year old male with a history of previous psychotic episode in 2012, seizures, anxiety, depression, afib, and DM II presenting from [facility] ED as a transfer after being found by police walking naked in the road with a BB gun.   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 Likely in psychotic state, though with seizure history need to rule-out seizure activity  - Seizure Disorder: EEG non-revealing, continue Depakote, level normal on 02/17/11  - Acute  psychosis: likely due to [facility], seen by psychiatrist Dr. Ferol Luz who recommended placement when medically stable, which he now is medically stable  - Tremor- HOLD HOME ROPINIROLE as this may be activating for psychosis  -continue home trazodone, cymbalta, and prn xanax  -Niacin & Folate daily  -CIWA   CV: patient with history of afib, HTN.   afib: continue amiodarone taper per recent discharge summary 400mg  Q8 x 7 days then 400mg  BID x14 days then 400mg  Qday x14 days then 200mg  Qday  -place patient on cardiac monitoring-remains in normal sinus rhythm   HTN - appropriate today  - consider restarting home lopressor and/or lisinopril if BP elevates  -will hold home HCTZ and torsemide due to low BP-patient has not been taking this per epic Documentation  -continue to follow vitals   Pulm: - Patient with history of COPD. No respiratory complaints at this time.  - continue home combivent  - nicotine replacement patch   Endo:  - patient with history of DM II. Most recent HbA1c 5.5.  - continue home glipizide   FEN/GI:  - patient with history of IBS and GERD, also with hypokalemia  -continue home Bentyl for IBS  -protonix for GERD  -patient with hypokalemia-will continue to replete with daily k-dur tablet  -BMP tomorrow am  -continue home vitamin B1  -zofran prn for nausea  -lomotil prn for diarrhea  -continue folic acid, magnesium oxide  ID:  - patient with recent history of right proximal tibia cellulitis, appears to be healing lesion at this time.Also with elevated WBC to 14.6 at admission, now 5.9. Currently afebrile. No signs of systemic infection to point to cause of elevated WBC.  -will continue doxycyline for full 8 day course; final day 02/21/11  -CBC in am  -wound care recs continuing santyl ointment.   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  - will hold on prednisone due to acute pt psychosis. Methotrexate is once a week,  per nursing he may not be taking this as well.   Prophylaxis: SCDs  Disposition: waiting placement and appreciate assistance of Psych social work  Code Status: full   [name], MD 02/18/2012, 8:31 AM

## 2012-02-18 NOTE — Progress Notes (Signed)
FMTS Attending Daily Note: Gertha Lichtenberg MD [phone] pager office [phone] I  have seen and examined this patient, reviewed their chart. I have discussed this patient with the resident. I agree with the resident's findings, assessment and care plan. 

## 2012-02-18 NOTE — Progress Notes (Signed)
Pt's ex mother-in-law brought in the pair of boots the pt believed were here at the hospital, but they were at his home. She stated that she will look for the other missing items to see if they are at his house as well. A pair of clothes were placed in the closet in his room, but the pt was not notified of this, so that he would not try to put on the clothes and leave the hospital AMA.

## 2012-02-18 NOTE — Progress Notes (Signed)
Pt very anxious about some items of his that are missing, including some dentures, some boots, a t-shirt, and a blue case with his keys in them. The only belongings noted in EPIC are clothes. Security was called to pt's room to discuss missing items. 1mg  Xanax given PO to relieve anxiety. Will continue to monitor.

## 2012-02-19 MED ORDER — QUETIAPINE FUMARATE 50 MG PO TABS
50.0000 mg | ORAL_TABLET | Freq: Every day | ORAL | Status: DC
  Administered 2012-02-19 – 2012-02-20 (×2): 50 mg via ORAL
  Filled 2012-02-19 (×3): qty 1

## 2012-02-19 NOTE — Progress Notes (Signed)
I examined this patient and discussed the care plan with Dr Ermalinda Memos and the [facility] team and agree with assessment and plan as documented in the progress note above. Mr Patrick Holt was calmly responsive to questions, but remains very tangential and perseverative in the topics he discusses. Now says that he has to go home so that he can call [employer] about his phone bill. No tremor or cogwheel rigidity evident off of the Ropinirole

## 2012-02-19 NOTE — Progress Notes (Signed)
Interval note   S:  Called to patients bedside by RN because he is anxious and threatening to leave. States that he would like to go home and does not want to stay for admission to psychiatric facility. Feels anxious about having to stay. He really wants to go home because he wants a haircut and because he claims we aren't doing anything for him here. Threatening to leave AMA.   O:  Gen: NAD, alert,  Psych: anxious affect, oriented to person place and time, denies SI/HI, denies visual/auditory hallucinations.   A/P: Counseled patient that we are still adjusting meds and awaiting placement for psych placement as this is what would be best for him given his multiple recent episodes of unusual behavior and multiple hospital admissions.   Add Seroquel 50 mg qHS Continue to monitor  [name], MD 4:41 PM, 02/19/2012

## 2012-02-19 NOTE — Progress Notes (Signed)
Pt has been anxious all day, wondering why he is being kept in the hospital. I have informed the pt numerous times of the reason why he is here, and tried to re-direct his repetitive thoughts to leave the hospital. When pt speaks, he is still having tangential thoughts, and rambling speech, and is in denial of the reason for this hospitalization - "I would never walk naked in the street with a bb gun, I always wear clothes." Pt has been deemed to be lacking capacity by psych, and it has been recommended that pt be discharged to a behavioral health facility, but I am unsure if pt will be willing to stay in the hospital until those arrangements have been made. Pt threatens to leave AMA. MD has been made aware. MD spoke with pt and encouraged compliance with discharge plan. Order received for Seroquel. Will continue to monitor. Pt continues to have 24/7 sitter for elopement.

## 2012-02-19 NOTE — Progress Notes (Signed)
Family Medicine Teaching Service Daily Progress Note Service Page: [phone]  Subjective:  Patient ready to go home, very concerned that he needs a haircut and to go buy groceries. Denies any pain. Describes his mood as pretty good today. Does not like the idea of going to inpatient psych and states that he will leave if he's not sent home today.   Objective: Temp:  [97.9 F (36.6 C)-99.1 F (37.3 C)] 98.5 F (36.9 C) (01/03 1815) Pulse Rate:  [83-87] 83  (01/03 1815) Resp:  [18] 18  (01/03 1815) BP: (109-134)/(71-84) 134/84 mmHg (01/03 1815) SpO2:  [96 %] 96 % (01/03 1333) Exam: General: NAD, lying comfortable in bed Cardiovascular: RRR, No murmur Respiratory: CTAB, Non labored  Abd: SNTND, + BS, No guarding Psych: tangential thought process, perseveration on variety of topics, alert and oriented x3  Skin: 2x2 cm wound on proximal right tibia, bandaged with with santyl ointment, no erythema or drainage apparent, bandage clean, dry and intact   I have reviewed the patient's medications, labs, imaging, and diagnostic testing.  Notable results are summarized below.  CBC BMET   Lab 02/18/12 0545 02/16/12 0615 02/14/12 0954  WBC 5.9 6.4 6.9  HGB 13.6 13.7 12.5*  HCT 43.3 42.6 38.7*  PLT 287 303 293    Lab 02/18/12 0545 02/16/12 0615 02/14/12 0954  NA 146* 145 146*  K 4.0 3.5 3.6  CL 105 105 105  CO2 30 29 29   BUN 15 15 22   CREATININE 1.03 0.90 1.24  GLUCOSE 59* 91 84  CALCIUM 9.4 9.3 9.2       Plan: Patrick Holt is a 62 y.o. year old male with a history of previous psychotic episode in 2012, seizures, anxiety, depression, afib, and DM II presenting from [facility] ED as a transfer after being found by police walking naked in the road with a BB gun.   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 - Seizure Disorder: EEG non-revealing, continue Depakote, level normal on 02/17/11  - Acute psychosis: likely due to [facility], seen by psychiatrist Dr. Ferol Luz who recommended placement when medically stable - Home Ropinirole held- as this may be activating and contribute to hallucinations with concomitant psychosis.   - continue home trazodone, cymbalta, and prn xanax  - Niacin & Folate daily  - CIWA   CV: patient with history of afib, HTN.   afib:  - continue amiodarone taper per recent discharge summary 400mg  Q8 x 7 days then 400mg  BID x14 days then 400mg  Qday x14 days then 200mg  Qday  - place patient on cardiac monitoring-remains in normal sinus rhythm   HTN - appropriate today - all home meds held - consider restarting home lopressor and/or lisinopril if BP elevates  - will hold home HCTZ and torsemide due to low BP-patient has not been taking this per epic Documentation  - continue to follow vitals   Pulm: - Patient with history of COPD. No respiratory complaints at this time.  - continue home combivent  - nicotine replacement patch   Endo:  - patient with history of DM II. Most recent HbA1c 5.5.  - continue home glipizide   FEN/GI:  - patient with history of IBS and GERD, also with hypokalemia  - continue home Bentyl for IBS  - protonix for GERD  - patient with  hypokalemia- resolved on 02/18/2012, recheck on 1/5 - continue home vitamin B1  - zofran prn for nausea  - lomotil prn for diarrhea  - continue folic acid, magnesium oxide   ID:  - patient with recent history of right proximal tibia cellulitis, appears to be healing lesion at this time. - WBC to 14.6 at admission, now 5.9.  - Currently afebrile - will continue doxycyline for full 8 day course; final day 02/21/11  - CBC in am  - wound care recs continuing santyl ointment.   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  - will hold on  prednisone due to acute pt psychosis. Methotrexate is once a week, per nursing he may not be taking this as well.   Prophylaxis: SCDs  Disposition: waiting placement and appreciate assistance of Psych social work  Code Status: full   [name], MD 02/19/2012, 9:07 AM

## 2012-02-20 NOTE — Progress Notes (Signed)
I examined this patient and discussed the care plan with Ritch and the FPTS team and agree with assessment and plan as documented in the progress note above.
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
## 2012-02-21 ENCOUNTER — Inpatient Hospital Stay: Payer: Medicare HMO | Admitting: Sports Medicine

## 2012-02-21 DIAGNOSIS — R4189 Other symptoms and signs involving cognitive functions and awareness: Secondary | ICD-10-CM | POA: Diagnosis present

## 2012-02-21 MED ORDER — COLLAGENASE 250 UNIT/GM EX OINT: TOPICAL_OINTMENT | Freq: Every day | CUTANEOUS | Status: DC

## 2012-02-21 MED ORDER — QUETIAPINE FUMARATE 50 MG PO TABS: 50.0000 mg | ORAL_TABLET | Freq: Every day | ORAL | Status: DC

## 2012-02-21 MED ORDER — DIVALPROEX SODIUM 500 MG PO DR TAB: 500.0000 mg | DELAYED_RELEASE_TABLET | Freq: Two times a day (BID) | ORAL | Status: DC
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
## 2012-02-21 NOTE — Progress Notes (Signed)
Clinical Social Work  CSW continues to work on placement for patient. [facility] reports patient is on waiting list but no 400 beds available at this time. CSW spoke with Steward Drone at [facility] who reported MD is reviewing referrals this morning. CSW sent updated information regarding patient's progress. CSW spoke with MD regarding placement barriers. CSW will continue to follow to work on placement.  [place], [place] [phone]

## 2012-02-21 NOTE — Progress Notes (Signed)
FMTS Attending Note  Patient seen and examined by me, discussed with resident team and I agree with assess/plan. Paula Compton, MD
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
## 2012-02-21 NOTE — Care Management Note (Addendum)
   CARE MANAGEMENT NOTE 02/21/2012  Patient:  Patrick Holt, Patrick Holt   Account Number:  [id number]  Date Initiated:  02/15/202014  Documentation initiated by:  Brettney Ficken  Subjective/Objective Assessment:   Pt followed for progression and d/c planning by CM. Plan for inpt psych until today, now for [facility] HHRN for psy eval not available     Action/Plan:   [facility] has agreed to visit pt for safety eval, [employer] will setup [facility] program for this pt with RN and social worker to see pt in home weekly for 3-4 weeks. Also spoke with CSW [place] who is making pt appointment at the [facility].   Anticipated DC Date:  02/15/202014   Anticipated DC Plan:  HOME W HOME HEALTH SERVICES         Choice offered to / List presented to:          [facility] arranged  HH-1 RN      [facility] agency  [facility]   Status of service:  Completed, signed off Medicare Important Message given?   (If response is "NO", the following Medicare IM given date fields will be blank) Date Medicare IM given:   Date Additional Medicare IM given:    Discharge Disposition:  HOME W HOME HEALTH SERVICES  Per UR Regulation:    If discussed at Long Length of Stay Meetings, dates discussed:    Comments:  02/15/202014 Spoke with pt insurance provider re [facility] program for this pt. They will setup [address] with RN and social worker to see pt in home weekly for 3-4 weeks. Have also asked [facility] to make [facility] visits to eval for safety in the home immediately post d/c . Also spoke with Unk Lightning CSW for psy pt and she has arranged outpt follow up for this pt at [facility] for psy issues. Met with pt to explain plan for [facility], [facility] RN and social worker and plan to followup with [facility].  Johny Shock RN MPH Case Manager [phone]

## 2012-02-21 NOTE — Progress Notes (Addendum)
Clinical Social Work  CSW staffed case with psych MD. Psych MD reports she is signing off on patient but is requesting HH be set up. CSW informed unit CSW of patient's needs and she is agreeable to inform CM of HH needs. CSW is signing off but available if further needs arise.  [place], [place] [phone]  Addendum: From previous admission patient reported he stopped care at [facility] and was interested in other agencies. Patient agreeable to follow up at [facility]. Information placed on AVS for patient as well. Patient will use [facility] for transportation to appointments.

## 2012-02-21 NOTE — Progress Notes (Signed)
Clinical Social Work  CSW met with patient and friend in room. Friend unable to transport patient. Patient requesting [facility] transport and aware of costs. CSW set up transportation and placed paperwork on chart.  [place], [place] [phone]

## 2012-02-23 ENCOUNTER — Other Ambulatory Visit: Payer: Self-pay | Admitting: *Deleted

## 2012-02-23 ENCOUNTER — Other Ambulatory Visit: Payer: Self-pay | Admitting: Internal Medicine

## 2012-02-23 MED ORDER — IPRATROPIUM-ALBUTEROL 20-100 MCG/ACT IN AERS: 1.0000 | INHALATION_SPRAY | Freq: Four times a day (QID) | RESPIRATORY_TRACT | Status: DC

## 2012-02-23 MED ORDER — ESOMEPRAZOLE MAGNESIUM 40 MG PO CPDR: 40.0000 mg | DELAYED_RELEASE_CAPSULE | Freq: Every day | ORAL | Status: DC

## 2012-02-23 MED ORDER — DICLOFENAC SODIUM 75 MG PO TBEC: 75.0000 mg | DELAYED_RELEASE_TABLET | Freq: Two times a day (BID) | ORAL | Status: DC | PRN

## 2012-02-23 MED ORDER — GLIPIZIDE ER 5 MG PO TB24: 5.0000 mg | ORAL_TABLET | Freq: Every day | ORAL | Status: DC

## 2012-02-23 MED ORDER — DICYCLOMINE HCL 20 MG PO TABS: 20.0000 mg | ORAL_TABLET | Freq: Two times a day (BID) | ORAL | Status: DC

## 2012-02-23 NOTE — Discharge Summary (Signed)
FMTS Attending Note Patient case discussed with resident team, I agree with discharge plan as per Dr. Felipa Emory D/C summary. Paula Compton, MD

## 2012-02-23 NOTE — Telephone Encounter (Signed)
Rx's sent to patient's pharmacy. Left message advising patient.

## 2012-02-24 NOTE — Care Management Note (Addendum)
  Late entry: This CM received a Call from [facility] on 11/21/2012 stating that they would be unable to provide services for this pt as per their history they found the pt to be noncompliant and were unable to work with the patient. Genevieve Norlander contacted and has a Chief Strategy Officer program but are unable to network with this pt insurance provider. Spoke with pt insurance provider on 02/23/2011 am and upon their recommendation contacted [facility] and faxed info to that agency. Case manager notified 02/23/12 pm that [facility] would be unable to provide services due to inadequate staffing. This case manager then contacted [facility] and faxed pt info. Per [facility] rep, Corrie Dandy they will attempt to provide [facility] for safety eval and medication management. Pt insurance provider also recommended referral to [facility]. This CM spoke with Unk [place] CSW who stated that she has called DSS re this pt needs and was told that they would make a "welfare" visit to this pt.  Johny Shock RN MPH Case Manager [phone]

## 2012-02-25 ENCOUNTER — Telehealth: Payer: Self-pay | Admitting: Cardiology

## 2012-02-25 ENCOUNTER — Other Ambulatory Visit: Payer: Self-pay | Admitting: *Deleted

## 2012-02-25 NOTE — Telephone Encounter (Signed)
New Problem:    Called in with a question about the patient's Pacerone. Please call back.

## 2012-02-25 NOTE — Telephone Encounter (Signed)
I spoke with [facility] care manager about pt Amiodarone. Did not check blood pressure or pulse. "Seems like he was doing okay. Felt like he needed help in the home". States pt has not been taking any Amiodarone. He was discharged from the hospital on 02/21/12 and was to taper Amiodarone down as per instruction. She is concerned that he is "not safe in the home about medications and taking care of himself".  Offered earlier appt with Tereso Newcomer ( has appt on 1/ 17/14) Appt made for Tuesday at 2:20 pm. She will call him to have [facility] to pick him up. Not sure if he can get [facility] transportation.  She is not sure if a nurse will be going out this weekend.  Mylo Red RN

## 2012-02-26 ENCOUNTER — Telehealth: Payer: Self-pay | Admitting: Cardiology

## 2012-02-26 NOTE — Telephone Encounter (Signed)
Called patient to remind him of his appointment time as it was recently changed. It is now 02/29/12 at 2:20. He wrote it down and stated understanding.  [place], PA-C 02/26/2012 1:55 PM

## 2012-02-28 ENCOUNTER — Telehealth: Payer: Self-pay | Admitting: Sports Medicine

## 2012-02-28 MED ORDER — POTASSIUM CHLORIDE CRYS ER 20 MEQ PO TBCR: 20.0000 meq | EXTENDED_RELEASE_TABLET | Freq: Every day | ORAL | Status: DC

## 2012-02-28 MED ORDER — OXYBUTYNIN CHLORIDE 5 MG PO TABS: 5.0000 mg | ORAL_TABLET | Freq: Three times a day (TID) | ORAL | Status: DC

## 2012-02-28 MED ORDER — NITROGLYCERIN 0.4 MG SL SUBL: 0.4000 mg | SUBLINGUAL_TABLET | SUBLINGUAL | Status: DC | PRN

## 2012-02-28 NOTE — Telephone Encounter (Signed)
Will forward to Dr Rigby 

## 2012-02-28 NOTE — Telephone Encounter (Signed)
Care Manager is calling because Patrick Holt was D/C'd from the hospital without any home care therapy set up.  She said that he called her on Saturday and insisted that she come from 1.5 hours away to pick up a piece of paper from the floor or he was going to kill himself.  She contacted 911 and they went to his home but did not take him in so as far as she knows, he is still at home.  She doesn't feel that he should be in the home alone.  She would also like for some type of home care orders be started for him.  She is also going to try and get Meals on Wheels started for him.  She would like either the nurse or Dr. Berline Holt to call her to let her know what type of Home Care is going to be set up for him.

## 2012-02-29 ENCOUNTER — Emergency Department (HOSPITAL_COMMUNITY): Payer: Medicare HMO

## 2012-02-29 ENCOUNTER — Ambulatory Visit: Payer: Medicare HMO | Admitting: Physician Assistant

## 2012-02-29 ENCOUNTER — Emergency Department (HOSPITAL_COMMUNITY)
Admission: EM | Admit: 2012-02-29 | Discharge: 2012-03-09 | Disposition: A | Discharge status: Home / Self Care | Payer: Medicare HMO | Attending: Emergency Medicine | Admitting: Emergency Medicine

## 2012-02-29 ENCOUNTER — Encounter (HOSPITAL_COMMUNITY): Payer: Self-pay

## 2012-02-29 DIAGNOSIS — Z9114 Patient's other noncompliance with medication regimen: Secondary | ICD-10-CM

## 2012-02-29 DIAGNOSIS — E785 Hyperlipidemia, unspecified: Secondary | ICD-10-CM | POA: Insufficient documentation

## 2012-02-29 DIAGNOSIS — K219 Gastro-esophageal reflux disease without esophagitis: Secondary | ICD-10-CM | POA: Insufficient documentation

## 2012-02-29 DIAGNOSIS — E669 Obesity, unspecified: Secondary | ICD-10-CM | POA: Insufficient documentation

## 2012-02-29 DIAGNOSIS — R259 Unspecified abnormal involuntary movements: Secondary | ICD-10-CM | POA: Insufficient documentation

## 2012-02-29 DIAGNOSIS — G909 Disorder of the autonomic nervous system, unspecified: Secondary | ICD-10-CM | POA: Insufficient documentation

## 2012-02-29 DIAGNOSIS — Z8679 Personal history of other diseases of the circulatory system: Secondary | ICD-10-CM | POA: Insufficient documentation

## 2012-02-29 DIAGNOSIS — Z87442 Personal history of urinary calculi: Secondary | ICD-10-CM | POA: Insufficient documentation

## 2012-02-29 DIAGNOSIS — Z87448 Personal history of other diseases of urinary system: Secondary | ICD-10-CM | POA: Insufficient documentation

## 2012-02-29 DIAGNOSIS — F329 Major depressive disorder, single episode, unspecified: Secondary | ICD-10-CM | POA: Insufficient documentation

## 2012-02-29 DIAGNOSIS — J4489 Other specified chronic obstructive pulmonary disease: Secondary | ICD-10-CM | POA: Insufficient documentation

## 2012-02-29 DIAGNOSIS — R21 Rash and other nonspecific skin eruption: Secondary | ICD-10-CM | POA: Insufficient documentation

## 2012-02-29 DIAGNOSIS — J449 Chronic obstructive pulmonary disease, unspecified: Secondary | ICD-10-CM | POA: Insufficient documentation

## 2012-02-29 DIAGNOSIS — Z91199 Patient's noncompliance with other medical treatment and regimen due to unspecified reason: Secondary | ICD-10-CM | POA: Insufficient documentation

## 2012-02-29 DIAGNOSIS — F411 Generalized anxiety disorder: Secondary | ICD-10-CM | POA: Insufficient documentation

## 2012-02-29 DIAGNOSIS — Z872 Personal history of diseases of the skin and subcutaneous tissue: Secondary | ICD-10-CM | POA: Insufficient documentation

## 2012-02-29 DIAGNOSIS — Z8669 Personal history of other diseases of the nervous system and sense organs: Secondary | ICD-10-CM | POA: Insufficient documentation

## 2012-02-29 DIAGNOSIS — E1149 Type 2 diabetes mellitus with other diabetic neurological complication: Secondary | ICD-10-CM | POA: Insufficient documentation

## 2012-02-29 DIAGNOSIS — F259 Schizoaffective disorder, unspecified: Secondary | ICD-10-CM | POA: Insufficient documentation

## 2012-02-29 DIAGNOSIS — F17200 Nicotine dependence, unspecified, uncomplicated: Secondary | ICD-10-CM | POA: Insufficient documentation

## 2012-02-29 DIAGNOSIS — F429 Obsessive-compulsive disorder, unspecified: Secondary | ICD-10-CM | POA: Insufficient documentation

## 2012-02-29 DIAGNOSIS — I252 Old myocardial infarction: Secondary | ICD-10-CM | POA: Insufficient documentation

## 2012-02-29 DIAGNOSIS — K222 Esophageal obstruction: Secondary | ICD-10-CM | POA: Insufficient documentation

## 2012-02-29 DIAGNOSIS — F29 Unspecified psychosis not due to a substance or known physiological condition: Secondary | ICD-10-CM | POA: Insufficient documentation

## 2012-02-29 DIAGNOSIS — Z7982 Long term (current) use of aspirin: Secondary | ICD-10-CM | POA: Insufficient documentation

## 2012-02-29 DIAGNOSIS — Z8739 Personal history of other diseases of the musculoskeletal system and connective tissue: Secondary | ICD-10-CM | POA: Insufficient documentation

## 2012-02-29 DIAGNOSIS — Z8719 Personal history of other diseases of the digestive system: Secondary | ICD-10-CM | POA: Insufficient documentation

## 2012-02-29 DIAGNOSIS — Z862 Personal history of diseases of the blood and blood-forming organs and certain disorders involving the immune mechanism: Secondary | ICD-10-CM | POA: Insufficient documentation

## 2012-02-29 DIAGNOSIS — F4010 Social phobia, unspecified: Secondary | ICD-10-CM | POA: Insufficient documentation

## 2012-02-29 DIAGNOSIS — IMO0002 Reserved for concepts with insufficient information to code with codable children: Secondary | ICD-10-CM | POA: Insufficient documentation

## 2012-02-29 DIAGNOSIS — Z79899 Other long term (current) drug therapy: Secondary | ICD-10-CM | POA: Insufficient documentation

## 2012-02-29 DIAGNOSIS — Z9119 Patient's noncompliance with other medical treatment and regimen: Secondary | ICD-10-CM | POA: Insufficient documentation

## 2012-02-29 DIAGNOSIS — F1027 Alcohol dependence with alcohol-induced persisting dementia: Secondary | ICD-10-CM | POA: Insufficient documentation

## 2012-02-29 DIAGNOSIS — F319 Bipolar disorder, unspecified: Secondary | ICD-10-CM | POA: Insufficient documentation

## 2012-02-29 HISTORY — DX: Bipolar disorder, unspecified: F31.9

## 2012-02-29 HISTORY — DX: Obsessive-compulsive disorder, unspecified: F42.9

## 2012-02-29 LAB — RAPID URINE DRUG SCREEN, HOSP PERFORMED
Amphetamines: NOT DETECTED
Barbiturates: NOT DETECTED
Benzodiazepines: NOT DETECTED
Cocaine: NOT DETECTED
Tetrahydrocannabinol: NOT DETECTED

## 2012-02-29 LAB — COMPREHENSIVE METABOLIC PANEL
ALT: 30 U/L (ref 0–53)
AST: 54 U/L — ABNORMAL HIGH (ref 0–37)
Albumin: 3.3 g/dL — ABNORMAL LOW (ref 3.5–5.2)
CO2: 25 mEq/L (ref 19–32)
Calcium: 9.2 mg/dL (ref 8.4–10.5)
GFR calc non Af Amer: >90 mL/min (ref >90)
Sodium: 137 mEq/L (ref 135–145)

## 2012-02-29 LAB — CBC WITH DIFFERENTIAL/PLATELET
Basophils Absolute: 0 10*3/uL (ref 0.0–0.1)
Eosinophils Relative: 0 % (ref 0–5)
Lymphocytes Relative: 17 % (ref 12–46)
Neutro Abs: 7.1 10*3/uL (ref 1.7–7.7)
Platelets: 343 10*3/uL (ref 150–400)
RDW: 13.2 % (ref 11.5–15.5)
WBC: 10.1 10*3/uL (ref 4.0–10.5)

## 2012-02-29 LAB — URINALYSIS, ROUTINE W REFLEX MICROSCOPIC
Glucose, UA: NEGATIVE mg/dL
Hgb urine dipstick: NEGATIVE
Specific Gravity, Urine: 1.027 (ref 1.005–1.030)
Urobilinogen, UA: 1 mg/dL (ref 0.0–1.0)
pH: 6 (ref 5.0–8.0)

## 2012-02-29 MED ORDER — LISINOPRIL-HYDROCHLOROTHIAZIDE 10-12.5 MG PO TABS: 1.0000 | ORAL_TABLET | Freq: Every day | ORAL | Status: DC

## 2012-02-29 MED ORDER — METHOTREXATE 2.5 MG PO TABS: 7.5000 mg | ORAL_TABLET | ORAL | Status: DC

## 2012-02-29 MED ORDER — PANTOPRAZOLE SODIUM 40 MG PO TBEC
40.0000 mg | DELAYED_RELEASE_TABLET | Freq: Every day | ORAL | Status: DC
  Administered 2012-02-29 – 2012-03-09 (×10): 40 mg via ORAL
  Filled 2012-02-29 (×10): qty 1

## 2012-02-29 MED ORDER — TRAZODONE HCL 50 MG PO TABS
50.0000 mg | ORAL_TABLET | Freq: Every day | ORAL | Status: DC
  Administered 2012-02-29 – 2012-03-02 (×3): 50 mg via ORAL
  Filled 2012-02-29 (×3): qty 1

## 2012-02-29 MED ORDER — OXYBUTYNIN CHLORIDE 5 MG PO TABS
5.0000 mg | ORAL_TABLET | Freq: Three times a day (TID) | ORAL | Status: DC
  Administered 2012-02-29 – 2012-03-09 (×27): 5 mg via ORAL
  Filled 2012-02-29 (×28): qty 1

## 2012-02-29 MED ORDER — QUETIAPINE FUMARATE 50 MG PO TABS
50.0000 mg | ORAL_TABLET | Freq: Every day | ORAL | Status: DC
  Administered 2012-02-29: 50 mg via ORAL
  Filled 2012-02-29: qty 1

## 2012-02-29 MED ORDER — METOPROLOL TARTRATE 25 MG PO TABS
50.0000 mg | ORAL_TABLET | Freq: Two times a day (BID) | ORAL | Status: DC
  Administered 2012-02-29 – 2012-03-09 (×17): 50 mg via ORAL
  Filled 2012-02-29 (×7): qty 2
  Filled 2012-02-29: qty 1
  Filled 2012-02-29 (×2): qty 2
  Filled 2012-02-29 (×2): qty 1
  Filled 2012-02-29 (×6): qty 2
  Filled 2012-02-29: qty 1
  Filled 2012-02-29: qty 2

## 2012-02-29 MED ORDER — GLIPIZIDE ER 5 MG PO TB24
5.0000 mg | ORAL_TABLET | Freq: Every day | ORAL | Status: DC
  Administered 2012-03-01 – 2012-03-09 (×7): 5 mg via ORAL
  Filled 2012-02-29 (×11): qty 1

## 2012-02-29 MED ORDER — SODIUM CHLORIDE 0.9 % IV SOLN
INTRAVENOUS | Status: DC
Start: 2012-02-29 — End: 2012-02-29
  Administered 2012-02-29: 21:00:00 via INTRAVENOUS
  Administered 2012-02-29: 1000 mL via INTRAVENOUS

## 2012-02-29 MED ORDER — AMIODARONE HCL 200 MG PO TABS
200.0000 mg | ORAL_TABLET | Freq: Every day | ORAL | Status: DC
  Filled 2012-02-29: qty 1

## 2012-02-29 MED ORDER — DIVALPROEX SODIUM 500 MG PO DR TAB
500.0000 mg | DELAYED_RELEASE_TABLET | Freq: Two times a day (BID) | ORAL | Status: DC
  Administered 2012-02-29 – 2012-03-09 (×18): 500 mg via ORAL
  Filled 2012-02-29 (×18): qty 1

## 2012-02-29 MED ORDER — IPRATROPIUM-ALBUTEROL 20-100 MCG/ACT IN AERS
1.0000 | INHALATION_SPRAY | Freq: Four times a day (QID) | RESPIRATORY_TRACT | Status: DC
  Administered 2012-03-01 – 2012-03-09 (×27): 1 via RESPIRATORY_TRACT
  Filled 2012-02-29: qty 4

## 2012-02-29 MED ORDER — ZIPRASIDONE MESYLATE 20 MG IM SOLR
10.0000 mg | Freq: Once | INTRAMUSCULAR | Status: AC
  Administered 2012-02-29: 10 mg via INTRAMUSCULAR
  Filled 2012-02-29: qty 20

## 2012-02-29 MED ORDER — DIPHENHYDRAMINE HCL 50 MG/ML IJ SOLN: 50.0000 mg | Freq: Once | INTRAMUSCULAR | Status: DC

## 2012-02-29 MED ORDER — LISINOPRIL 10 MG PO TABS
10.0000 mg | ORAL_TABLET | Freq: Every day | ORAL | Status: DC
  Administered 2012-02-29 – 2012-03-09 (×10): 10 mg via ORAL
  Filled 2012-02-29 (×10): qty 1

## 2012-02-29 MED ORDER — HYDROCHLOROTHIAZIDE 12.5 MG PO CAPS
12.5000 mg | ORAL_CAPSULE | Freq: Every day | ORAL | Status: DC
  Administered 2012-02-29 – 2012-03-09 (×10): 12.5 mg via ORAL
  Filled 2012-02-29 (×11): qty 1

## 2012-02-29 MED ORDER — DICYCLOMINE HCL 20 MG PO TABS
20.0000 mg | ORAL_TABLET | Freq: Two times a day (BID) | ORAL | Status: DC
  Administered 2012-02-29 – 2012-03-09 (×18): 20 mg via ORAL
  Filled 2012-02-29 (×18): qty 1

## 2012-02-29 MED ORDER — DIPHENHYDRAMINE HCL 50 MG/ML IJ SOLN
50.0000 mg | Freq: Once | INTRAMUSCULAR | Status: AC
  Administered 2012-02-29: 50 mg via INTRAVENOUS
  Filled 2012-02-29: qty 1

## 2012-02-29 NOTE — ED Notes (Signed)
Pt states he has parkinsons. He has tremors. He is confused and unable to answer questions appropriately. Pt counts to 3 over and over. Pt states he is on prednisone.

## 2012-02-29 NOTE — Telephone Encounter (Signed)
Patient did not show for appointment today. Tereso Newcomer, PA-C  5:03 PM 02/29/2012

## 2012-02-29 NOTE — ED Notes (Signed)
Sitter at bedside.

## 2012-02-29 NOTE — ED Notes (Signed)
ZOX:[id number]<AV> Expected date:02/29/12<BR> Expected time: 4:12 PM<BR> Means of arrival:Ambulance<BR> Comments:<BR> Behavioral, elderly, non compliant
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
## 2012-03-01 ENCOUNTER — Inpatient Hospital Stay: Payer: Medicare HMO | Admitting: Sports Medicine

## 2012-03-01 ENCOUNTER — Other Ambulatory Visit: Payer: Self-pay

## 2012-03-01 ENCOUNTER — Emergency Department (HOSPITAL_COMMUNITY): Payer: Medicare HMO

## 2012-03-01 LAB — GLUCOSE, CAPILLARY
Glucose-Capillary: 100 mg/dL — ABNORMAL HIGH (ref 70–99)
Glucose-Capillary: 108 mg/dL — ABNORMAL HIGH (ref 70–99)

## 2012-03-01 MED ORDER — ZIPRASIDONE MESYLATE 20 MG IM SOLR
10.0000 mg | Freq: Once | INTRAMUSCULAR | Status: AC
  Administered 2012-03-01: 10 mg via INTRAMUSCULAR
  Filled 2012-03-01: qty 20

## 2012-03-01 MED ORDER — POTASSIUM CHLORIDE CRYS ER 20 MEQ PO TBCR
40.0000 meq | EXTENDED_RELEASE_TABLET | Freq: Once | ORAL | Status: AC
  Administered 2012-03-01: 40 meq via ORAL
  Filled 2012-03-01: qty 2

## 2012-03-01 MED ORDER — IBUPROFEN 800 MG PO TABS
800.0000 mg | ORAL_TABLET | Freq: Once | ORAL | Status: AC
  Administered 2012-03-01: 800 mg via ORAL
  Filled 2012-03-01: qty 1

## 2012-03-01 MED ORDER — QUETIAPINE FUMARATE 50 MG PO TABS
50.0000 mg | ORAL_TABLET | Freq: Every morning | ORAL | Status: DC
  Administered 2012-03-01 – 2012-03-09 (×9): 50 mg via ORAL
  Filled 2012-03-01 (×8): qty 1
  Filled 2012-03-01: qty 2

## 2012-03-01 MED ORDER — QUETIAPINE FUMARATE 100 MG PO TABS
100.0000 mg | ORAL_TABLET | Freq: Every day | ORAL | Status: DC
  Administered 2012-03-01 – 2012-03-02 (×2): 100 mg via ORAL
  Filled 2012-03-01 (×2): qty 1

## 2012-03-01 NOTE — ED Notes (Signed)
Pt was observed playing with bathroom tissue, tearing tissue into small pieces.  Was assisted to sit on commode earlier, had voided adequately but wanted to stay longer; pt was confused--- pt was redirected and assisted to get back to bed, pt's gait wobbly and balance unsteady.

## 2012-03-01 NOTE — ED Notes (Signed)
Pt requesting warm blanket and juice. Pt given items. Stated "Thank you".

## 2012-03-01 NOTE — ED Notes (Signed)
On-call specialist MD attempted to assess pt --- pt very drowsy and able to open eyes only for a few seconds; unable to follow instructions--- on-call MD recommended to call On-Call Specialist in the morning when patient is more alert and able to answer questions.

## 2012-03-01 NOTE — ED Notes (Signed)
Pt OOB, being verbally aggressive to RN; pt yelling and shaking fists; MD notified, security at bedside; pt assisted to BR and pt urinated on the floor, took multiple attempts to get pt back to bed after going to the bathroom.

## 2012-03-01 NOTE — ED Notes (Signed)
Pt lying in bed talking to self. Stating "I need to give you money to pay my bills, although I don't have much money at this moment" "But I'll defiantly do what I can".

## 2012-03-01 NOTE — ED Notes (Signed)
Sitter at bedside.

## 2012-03-01 NOTE — ED Notes (Signed)
Pt assisted to BR x 1 assist and back to bed; pt cooperative but continues to yell and talk aggressively to staff; room darkened and stimuli decreased.

## 2012-03-01 NOTE — ED Notes (Signed)
Pt sitting in chair with sitter at bedside; pt asked where is at this time and the month and year; pt just continues story about working for [employer] and numbers and being let go. Pt asked questions again and pt continues to ramble with the same story. Pt unable to answer questions or redirect to answer questions.

## 2012-03-01 NOTE — ED Notes (Signed)
Pt ambulated to the bathroom x 1assist, gait unsteady. States that he uses a Acupuncturist while at home.

## 2012-03-01 NOTE — ED Notes (Signed)
Pt sitting up in room in chair talking about "having an affair with women" "wife is a prostitute" "I use to work at [employer]

## 2012-03-01 NOTE — BHH Counselor (Signed)
nformed by TCU nurse that the telepsych will not be completed until after 10 AM. Ranae Pila, MS,LCAS

## 2012-03-01 NOTE — ED Provider Notes (Addendum)
7:41 AM  Filed Vitals:   03/01/12 0549  BP: 110/60  Pulse: 56  Temp: 98.3 F (36.8 C)  Resp: 18   Pt seen and assessed. Rambling, disorganized thoughts. Paranoid. Pending placement.   Raeford Razor, MD 03/01/12 [phone]   Patient's psychiatric consultation was reviewed. The to feel that patient is not psychiatrically stable for discharge. Recommending inpatient psychiatric hospitalization. Recommended Seroquel 50 mg the morning and 100 mg at bedtime.  Raeford Razor, MD 03/02/12 1105

## 2012-03-01 NOTE — ED Notes (Signed)
Telepych called.
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
## 2012-03-01 NOTE — ED Provider Notes (Signed)
EKG and CXR ordered for admission to [facility].    Date: 03/01/2012  Rate: 57  Rhythm: sinus bradycardia  QRS Axis: normal  Intervals: normal  ST/T Wave abnormalities: normal  Conduction Disutrbances:none  Narrative Interpretation:   Old EKG Reviewed: none available    Richardean Canal, MD 03/01/12 2304
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
## 2012-03-01 NOTE — Progress Notes (Signed)
CSW received referral regarding patient unable to care for self at home. Per chart review, telepsych has been ordered. CSW to assess patient and patient situation.   Catha Gosselin, LCSWA  [phone] .03/01/2012 1550pm

## 2012-03-01 NOTE — ED Notes (Signed)
Pt was asked how he is feeling--- pt answered "I've been praying to Jesus all day for his intercession"; noted some flight of ideas--- "I painted him" ... "In chapter..chapter" ..." I don't remember...".."In [place]".  Pt was re-oriented and redirected.
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
## 2012-03-01 NOTE — ED Notes (Signed)
Pt asked "When are you serving dinner?  I've been here for three hours without anything"---- pt was re-oriented and offered a choice of [place] or ham sandwich; pt preferred the [place] sandwich which was offered.

## 2012-03-01 NOTE — ED Notes (Signed)
Pt is currently now IVC'd

## 2012-03-01 NOTE — ED Notes (Signed)
[phone] Zola Button Case Manager for [facility], states that pt is unsafe to live at home alone, not able to care for self. Not able to cook meals, walk (unsteady on feet). Feels pt needs consult to social work for possible home care.

## 2012-03-01 NOTE — Progress Notes (Signed)
[facility] ED CM reviewed RN note and requested EDP be consulted for SW consult order

## 2012-03-02 DIAGNOSIS — F209 Schizophrenia, unspecified: Secondary | ICD-10-CM

## 2012-03-02 DIAGNOSIS — F0390 Unspecified dementia without behavioral disturbance: Secondary | ICD-10-CM

## 2012-03-02 MED ORDER — ZIPRASIDONE MESYLATE 20 MG IM SOLR
10.0000 mg | Freq: Once | INTRAMUSCULAR | Status: AC
  Administered 2012-03-02: 10 mg via INTRAMUSCULAR
  Filled 2012-03-02: qty 20

## 2012-03-02 MED ORDER — LORAZEPAM 1 MG PO TABS
1.0000 mg | ORAL_TABLET | Freq: Four times a day (QID) | ORAL | Status: DC | PRN
  Administered 2012-03-02 – 2012-03-09 (×13): 1 mg via ORAL
  Filled 2012-03-02 (×14): qty 1

## 2012-03-02 NOTE — ED Notes (Signed)
Pt attempting OOB multiple times; pt assisted back in bed and advised its early morning; pt states "I need to take a shower, call the bank and get my mail"; Rn attempted to advise pt he was in the hospital and pt began yelling and stating "I am not"; pt talking to people he states are in the room when there is no one in the room; pt continues to mumble and repeat same statements repetitively.

## 2012-03-02 NOTE — ED Provider Notes (Signed)
Pt resting comfortably, nad. Vitals normal. Discussed w ACT team - awaiting placement.   Suzi Roots, MD 03/02/12 [phone]

## 2012-03-02 NOTE — BHH Counselor (Addendum)
Referred to [facility] [place]; pending review.    Referred to [facility]; pending review. *Insurance is Out of network with [facility].   Referred to [facility]; pending review. *Declined by Dr. Forrestine Him due to medical acuity 03/02/12 @ 1125.  Pt referred to [facility]; pending review.   *Declined due to medical acuity 03/02/12 @ 1845.

## 2012-03-02 NOTE — Progress Notes (Signed)
LOS meeting recommendations: Informed pt maybe denied for [facility] for dementia pmh 1) pt's baseline needs to be determined by ED psychiatrist 2) check on APS/guardianship status 2) most likely needs memory care unit placement -[facility] [place]   1645 ED CM and ED SW consulted about needs for pt and disposition plan

## 2012-03-02 NOTE — Progress Notes (Addendum)
Pt gave csw permission to speak with pt mother in law/neighbor regarding patient. Pt also gave CSW permission to speak with pt social worker Tia Turner. Pt was able to recall Sharon's number. Patient stated that Crist Infante was a Child psychotherapist who had been to patient house. CSW asked patient about [employer], however pt was not able to provide any information regarding [employer].   CSW attempted to call [employer] Case manager 2x regarding patient home condition however CSW unable to leave a voicemail.   CSW informed by RN that pt neighbor Jasmine December called stating that a social worker left a note on patient door Tia Turner. CSW attempted to call Jasmine December at number in pt chart. However CSW unable to leave a voice mail.   CSW called after hours DSS awaiting call back for APS report and to determine if patient has open case with APS.   CSW directed from Clinical social work Interior and spatial designer to initiate alf placement with log, as well as continue inpatient psychiatric placement search.   Per Dr. Elsie Saas note patient medications were adjusted, however disposition was not recommended. At this time, disposition will remain inpatient psychiatric stabilization.    CSW will refer patient to [place].   Catha Gosselin, LCSWA  [phone] .03/02/2012 1736pm

## 2012-03-02 NOTE — Progress Notes (Addendum)
Referred patient to [facility], pt accepted pending possible discharges tomorrow per Erskine Squibb.   Catha Gosselin, LCSWA  [phone] .03/02/2012 1030pm

## 2012-03-02 NOTE — ED Notes (Signed)
Pt attempting to get out of bed multiple times. Pt transferred to hospital bed for comfort and reminded pt that it still very early in the morning and he should rest.
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
## 2012-03-02 NOTE — ED Notes (Signed)
Pt is getting out of bed and walking out of his room, asking for his bank papers, pt sts he has to balance his checkbook; pt explained multiple times that he can't have any of his personal items with him, pt is agitated and argumentative, pt is stating he wants to go out and we can't stop him. This Teacher, early years/pre assisted pt back to his bed.
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
## 2012-03-02 NOTE — ED Notes (Signed)
Pt took shower and got clean scrubs and socks; bed linens changed; pt appear comfortable at this time

## 2012-03-03 ENCOUNTER — Encounter: Payer: Medicare HMO | Admitting: Physician Assistant

## 2012-03-03 LAB — COMPREHENSIVE METABOLIC PANEL
ALT: 20 U/L (ref 0–53)
Calcium: 8.8 mg/dL (ref 8.4–10.5)
GFR calc Af Amer: >90 mL/min (ref >90)
Glucose, Bld: 91 mg/dL (ref 70–99)
Sodium: 140 mEq/L (ref 135–145)
Total Protein: 5.9 g/dL — ABNORMAL LOW (ref 6.0–8.3)

## 2012-03-03 LAB — URINALYSIS, ROUTINE W REFLEX MICROSCOPIC
Hgb urine dipstick: NEGATIVE
Nitrite: NEGATIVE
Protein, ur: NEGATIVE mg/dL
Urobilinogen, UA: 1 mg/dL (ref 0.0–1.0)

## 2012-03-03 LAB — VALPROIC ACID LEVEL: Valproic Acid Lvl: 53.2 ug/mL (ref 50.0–100.0)

## 2012-03-03 MED ORDER — POTASSIUM CHLORIDE CRYS ER 20 MEQ PO TBCR
40.0000 meq | EXTENDED_RELEASE_TABLET | Freq: Once | ORAL | Status: AC
  Administered 2012-03-03: 40 meq via ORAL
  Filled 2012-03-03: qty 2

## 2012-03-03 MED ORDER — QUETIAPINE FUMARATE 100 MG PO TABS
200.0000 mg | ORAL_TABLET | Freq: Every day | ORAL | Status: DC
  Administered 2012-03-03 – 2012-03-08 (×6): 200 mg via ORAL
  Filled 2012-03-03 (×6): qty 2

## 2012-03-03 NOTE — Consult Note (Signed)
Reason for Consult:Psychosis, dementia and paranoia Referring Physician: Dr. Margarita Grizzle is an 62 y.o. male.  HPI: Patient was serve sleeping on his bed without distress. Staff nurse reported he was waking up when needed. Patient has no irritability, agitation and aggressive behavior since yesterday morning.   MSE: Patient was sleeping and no observable behavioral problems.  Past Medical History  Diagnosis Date  . Rheumatoid arthritis   . Obesity   . Major depression     with acute psychotic break in 06/2010  . Diabetes mellitus   . Hypertension   . Hyperlipidemia   . Diverticulosis of colon   . COPD (chronic obstructive pulmonary disease)   . Anxiety   . GERD (gastroesophageal reflux disease)   . Vertigo   . Seizures   . Fibromyalgia   . Dermatomyositis   . Myocardial infarct     mulitple (1999, 2000, 2004)  . Raynaud's disease   . Narcotic dependence   . Peripheral neuropathy   . Internal hemorrhoids   . Ischemic heart disease   . Hiatal hernia   . Gastritis   . Diverticulitis   . Hx of adenomatous colonic polyps   . Nephrolithiasis   . Anemia   . Esophageal stricture   . Esophageal dysmotility   . Dermatomyositis   . Paroxysmal a-fib   . Urge incontinence   . Otosclerosis   . Bipolar 1 disorder   . OCD (obsessive compulsive disorder)     Past Surgical History  Procedure Date  . Knee arthroscopy w/ meniscal repair 2009    left  . Lumbar disc surgery   . Squamous papilloma  2010    removed by Dr. Pollyann Kennedy ENT, noted on tongue    Family History  Problem Relation Age of Onset  . Alcohol abuse Mother   . Depression Mother   . Heart disease Mother   . Diabetes Mother   . Stroke Mother   . Diabetes Other     1/2 brother    Social History:  reports that he has been smoking Cigarettes.  He has been smoking about .5 packs per day. He has never used smokeless tobacco. He reports that he drinks about .6 ounces of alcohol per week. He reports that he  does not use illicit drugs.  Allergies:  Allergies  Allergen Reactions  . Betamethasone Dipropionate     Unknown  . Bupropion Hcl     Unknown  . Ciprofloxacin     REACTION: swelling  . Clobetasol     Unknown  . Codeine     Unknown  . Escitalopram Oxalate     Unknown  . Fluoxetine Hcl     Unknown  . Fluticasone-Salmeterol     Unknown  . Furosemide     Unknown  . Immune Globulins     Acute renal failure  . Metronidazole     REACTION: swelling  . Paroxetine     Unknown  . Penicillins     Unknown  . Statins     Unknown  . Tacrolimus     Unknown  . Tetanus Toxoid     Unknown  . Tuberculin Purified Protein Derivative     Unknown    Medications: I have reviewed the patient's current medications.  Results for orders placed during the hospital encounter of 02/29/12 (from the past 48 hour(s))  COMPREHENSIVE METABOLIC PANEL     Status: Abnormal   Collection Time   03/03/12  9:05 AM  Component Value Range Comment   Sodium 140  135 - 145 mEq/L    Potassium 3.2 (*) 3.5 - 5.1 mEq/L    Chloride 103  96 - 112 mEq/L    CO2 31  19 - 32 mEq/L    Glucose, Bld 91  70 - 99 mg/dL    BUN 10  6 - 23 mg/dL    Creatinine, Ser 2.84  0.50 - 1.35 mg/dL    Calcium 8.8  8.4 - 13.2 mg/dL    Total Protein 5.9 (*) 6.0 - 8.3 g/dL    Albumin 2.6 (*) 3.5 - 5.2 g/dL    AST 19  0 - 37 U/L    ALT 20  0 - 53 U/L    Alkaline Phosphatase 46  39 - 117 U/L    Total Bilirubin 0.8  0.3 - 1.2 mg/dL    GFR calc non Af Amer 89 (*) >90 mL/min    GFR calc Af Amer >90  >90 mL/min   GLUCOSE, CAPILLARY     Status: Normal   Collection Time   03/03/12  9:07 AM      Component Value Range Comment   Glucose-Capillary 87  70 - 99 mg/dL     Dg Chest 2 View  4/40/1027  *RADIOLOGY REPORT*  Clinical Data: Medical clearance, psychiatric evaluation.  CHEST - 2 VIEW  Comparison: 02/09/2012  Findings: There is patchy coarse subsegmental atelectasis or scarring in the lung bases, right greater than left as  before.  No new infiltrate.  No effusion.  Spurring in the lower thoracic spine.  Heart size upper limits normal.  Atheromatous aortic arch.  IMPRESSION:  1.  Chronic bibasilar changes.  No acute disease.   Original Report Authenticated By: D. Andria Rhein, MD     Positive for paranoid and non compliant with medication at home. Blood pressure 110/60, pulse 62, temperature 98.4 F (36.9 C), temperature source Axillary, resp. rate 18, SpO2 94.00%.   Assessment/Plan:  Schizoaffective disorder  Dementia, not otherwise specified.   Plan: Recommended acute psych hospitalization for stabilization prior to out of home placement. Continue Seroquel 200 mg at bedtime and continue treatment plan. Reviewed his valproic acid level is 53.2 mcg per mL and has a traces of ketones. Patient is waiting for the inpatient hospitalization bed availability.   Merwyn Hodapp,JANARDHAHA R. 03/03/2012, 10:38 AM

## 2012-03-03 NOTE — ED Notes (Signed)
Patient becoming more agitated, tearing up cup and saying he needs to pay his utility bills before they charge him a deposit.  Ativan given 1mg  per prn order.

## 2012-03-03 NOTE — Progress Notes (Signed)
LOS/progression [facility] ED CM consulted by ED SW. Medical director consulted. Medication management discussed.  Noted recommendations by ED psychiatrist for increase to 200 mg seroquel q hs.  EDP, Delo entered orders for medication changes per ED psych recommendations for 03/03/14 & 03/03/15.  Medical Director updated and made recommendations for pt care 1) disposition to memory care not inpatient [facility] admission 2) no further telepsych evaluations at this time

## 2012-03-03 NOTE — ED Provider Notes (Signed)
pts potassium replaced and he was to be sent to [facility] for admission but they have now canceled--social worker attempting to place  Toy Baker, MD 03/03/12 1539

## 2012-03-03 NOTE — Progress Notes (Addendum)
PT Referred to [facility]  denied due to medical acuity 03/03/2012   Referred to [facility]; pending review.  *Insurance is Out of network with [facility].  Referred to [facility]; pending review.  *Declined by Dr. Forrestine Him due to medical acuity 03/02/12 @ 1125.  Pt referred to [facility]; pending review.  *Declined due to medical acuity 03/02/12 @ 1845.  Pt referred to [place] Pending review.  [employer]; pending review.    CSW  Dance movement psychotherapist who recommended  that patient disposition is memory care rather than inpatient psychiatric treatment. Pt medications to be managed over weekend and assessed next week for alf placement. No telepsyche at this time.   CSW discussed case with nurse case manager, medications were updated to medical director, and recommendations were made for Medical Director.   CSW completed fl2 for MD signature and will place in patient shadow chart.   Catha Gosselin, LCSWA  [phone] .03/03/2012 1808pm

## 2012-03-03 NOTE — ED Notes (Signed)
Pt in geri chair with sitter at bedside.  Pt awake at times but drowsy.

## 2012-03-03 NOTE — Progress Notes (Signed)
Clarified medication management with EDP, Delo (seroquel 50 mg qam & seroquel 200 mg qhs)

## 2012-03-03 NOTE — ED Notes (Signed)
Patient sitting up in geri chair.

## 2012-03-03 NOTE — ED Notes (Signed)
[employer] social worker Angelique Blonder called for update.  Left phone number [phone].  Also left phone number for APS worker Salome Spotted [phone] and her supervisor Doyne Keel [phone].  Also left number for home health follow up nurse Tangela with transition program [phone].

## 2012-03-03 NOTE — Progress Notes (Signed)
CSW left message for Patrick Holt, Child psychotherapist for [employer] regarding patient at [phone].   CSW spoke with nurse Case Manager from [place], Patrick Holt. Per Patrick Holt, she was working with patient regarding medication management. Patrick Holt had called pt doctors to assist with appointments. When she tried to inform patient of instructions for doctor, patient was agitated and upset and could not find discharge paperwork. Pt demanded that nurse case manager come find the paperwork, she was unable to, and patient threatened to off himself if she did not return. She called 911, however police did not feel patient was threat to self.  Case manager is concerned because patient stated he had not eaten in days, was not taking medications, no support, and unable to care forself, and extremely weak. Patrick Holt and Patrick Holt agreed that if patient came to appointment therapy would be addressed to receive home health services for physical therapy. Patient did not make appointments on Tuesday as scheduled, but that was when patient was admitted to the ED.  An APS report was not made by [facility] case Production designer, theatre/television/film. Per human case manager, patient is on section 8 at this time. Per case manager, pt  was sleeping on the couch, medications were not being taken, not managed, no food in the house, and weak.   CSW unable to follow up with APS, phone numbers continue to ring for Patrick Holt and Patrick Holt as well as the main number.   Patrick Holt, LCSWA  [phone] .03/03/2012 1511pm

## 2012-03-03 NOTE — ED Notes (Signed)
Visitor at bedside.

## 2012-03-03 NOTE — ED Notes (Signed)
Patient has reddened area on sacrum.  Repositioned patient every 2 hours.

## 2012-03-03 NOTE — ED Notes (Signed)
Patient continues to be confused.  No change from previous assessment.

## 2012-03-03 NOTE — BHH Counselor (Signed)
Previous note shows that Child psychotherapist spoke with Erskine Squibb at [facility], states that pt is accepted pending an avail bed, however this Clinical research associate contacted [employer] at [employer] and spoke with Erskine Squibb, she states she did not tell the social worker that pt had been accepted.  Erskine Squibb says she told the social worker that the pt.'s information would be reviewed for possible admission once discharges were decided on 03/03/12.

## 2012-03-03 NOTE — Progress Notes (Signed)
ED CM reviewed care recommendations and medication changes with TCU RN

## 2012-03-03 NOTE — Progress Notes (Addendum)
CSW spoke with Shanda Bumps at [facility], stated that there were anticipated discharges and possible beds available. Patient information refaxed as requested. Pt pending review again.   Catha Gosselin, LCSWA  [phone] .03/03/2012 1202pm   PT denied at [facility] due to acuity.   CSW awaiting call back from [facility].   .[place], [place]  [phone] .03/03/2012 2:38pm

## 2012-03-03 NOTE — Op Note (Signed)
NAME:  Patrick Holt, Patrick Holt NO.:  [id number]  MEDICAL RECORD NO.:  [id number]  LOCATION:  WA26                         FACILITY:  [facility]  PHYSICIAN:  Loreta Ave, M.D. DATE OF BIRTH:  09-03-1950  DATE OF PROCEDURE:  03/02/2012 DATE OF DISCHARGE:  03/02/2012                              OPERATIVE REPORT   PREOPERATIVE DIAGNOSIS:  Right knee history of remote dislocation with recurrent subluxations.  Previous arthroscopy with lateral release.  Now with marked mechanical symptoms, loose bodies, documented on x-ray, MRI.  POSTOPERATIVE DIAGNOSIS:  Right knee history of remote dislocation with recurrent subluxations.  Previous arthroscopy with lateral release.  Now with marked mechanical symptoms, loose bodies, documented on x-ray, MRI with adequate previous release.  Persistent instability.  Grade 4 changes lateral trochlea.  A central grade 4 lesion trochlea.  Grade 3 changes throughout the patella.  Two obvious removable loose bodies in the knee.  One well down the popliteal tendon sheath unable to be removed.  PROCEDURE:  Right knee exam under anesthesia, arthroscopy.  Removal of loose bodies.  Chondroplasty, patellofemoral joint.  Micro fracturing of a contained grade 4 lesion trochlea.  SURGEON:  Loreta Ave, M.D.  ASSISTANT:  Genene Churn. Barry Dienes, [place].  ANESTHESIA:  General.  BLOOD LOSS:  Minimal.  SPECIMENS:  None.  CULTURES:  None.  COMPLICATION:  None.  DRESSINGS:  Soft compressive.  TOURNIQUET:  Not EMPLOYED.  DESCRIPTION OF THE PROCEDURE:  The patient was brought to operating room, placed on operating table in supine position.  After adequate anesthesia had been obtained, leg holder applied.  Leg prepped and draped in usual sterile fashion.  Two portals, one each medial and lateral parapatellar.  Arthroscope was introduced.  Knee distended and inspected.  Extensive changes from recurrent dislocation.  Chondroplasty to a stable surface   throughout the patellofemoral joint.  Much of the lateral border of the lateral femoral condyle and certainly the trochlea grade 4 wear.  Grade 3 over the entire patella.  A new focal grade 4 lesion in the middle of the trochlea debrided to stable margins treated with Vicryl fracturing, adequate bleeding confirmed.  Two relatively large loose bodies down in the front of the knee.  One at the base of the ACL, the other brought out the lateral compartment removed.  Medial meniscus, medial compartment, lateral meniscus, lateral compartment otherwise looked good.  A little attrition on the ACL, but functional intact.  I then looked at the entire rest of knee.  There was 1 loose body, well down the popliteal tendon sheath that I could see, but I could not bring out the knee and to remove.  This however, was not causing symptoms, I elected to leave that in place.  I got a good look at all recesses posterior aspect of the knee as well.  Certainly, lateral tracking, but no tethering.  Certainly instability from stretching medial ligaments, but no new tears.  Instruments were removed.  Portals were closed with nylon.  Sterile compressive dressing applied.  Anesthesia reversed.  Brought to the recovery room.  Tolerated the surgery well.  No complications.     Loreta Ave, M.D.     DFM/MEDQ  D:  03/02/2012  T:  03/03/2012  Job:  [id number]

## 2012-03-04 LAB — GLUCOSE, CAPILLARY: Glucose-Capillary: 85 mg/dL (ref 70–99)

## 2012-03-04 MED ORDER — POTASSIUM CHLORIDE CRYS ER 20 MEQ PO TBCR
20.0000 meq | EXTENDED_RELEASE_TABLET | Freq: Every day | ORAL | Status: DC
  Administered 2012-03-04 – 2012-03-09 (×6): 20 meq via ORAL
  Filled 2012-03-04 (×6): qty 1

## 2012-03-04 NOTE — ED Notes (Signed)
Pt sleeping. Respirations even and unlabored. Bilateral rise and fall of chest. Skin warm and dry. In no acute distress.   

## 2012-03-04 NOTE — ED Notes (Signed)
Pt at baseline confusion.  Pt resting, calm and cooperative in bed.

## 2012-03-04 NOTE — ED Notes (Signed)
NT/Sitter is currently giving Pt a bed bath, changing scrubs and changing bed linens.

## 2012-03-04 NOTE — ED Notes (Signed)
Pt family member brought sealed Fedex envelope for pt. Placed in locker 26.

## 2012-03-04 NOTE — ED Notes (Signed)
Pt's family member took Fedex envelope home.

## 2012-03-04 NOTE — ED Provider Notes (Signed)
Patrick Holt is a 62 y.o. male who was being evaluated for confusion and schizoaffective disorder. He has been seen by him to psychiatry. The current assessment by psychiatry, and case management, is that he needs placement in a memory care unit. Social work Department is working on placement.  The patient is sleeping on rounds today. His vital signs are normal. His potassium was repleted yesterday with a single dose. He is on hydrochlorothiazide, I will start, regular potassium, and check a level tomorrow.    Flint Melter, MD 03/04/12 [phone]

## 2012-03-04 NOTE — ED Notes (Signed)
Pt is able to tell me his name, birth date, location and situation.  Pt, also, knows it is January, but unable to tell me the date or day of the week.  Pt continues to have flight of ideas, but is able to be oriented/redirected.  Currently, cooperative and calm.

## 2012-03-05 LAB — POTASSIUM: Potassium: 3.4 mEq/L — ABNORMAL LOW (ref 3.5–5.1)

## 2012-03-05 NOTE — ED Notes (Signed)
Received report on pt. Rounded on pt. Pt sitting up in bed waiting television. Alert and oriented; requesting the phone to make a call. Pt made aware that phone calls are not made til after 9 am. Pt reports he is ok to wait.

## 2012-03-05 NOTE — ED Notes (Signed)
Potassium level 3.4. He is on oral potassium supplement.  Disposition plan is placement in a assisted living facility./Memory care unit.   Flint Melter, MD 03/05/12 [phone]

## 2012-03-05 NOTE — ED Notes (Signed)
Potassium improved today, 3.4. He is on daily oral potassium supplement.  Plan remains to place, and memory, care unit. No headway on this yesterday.  Patrick Melter, MD 03/05/12 [phone]

## 2012-03-06 MED ORDER — LORAZEPAM 2 MG/ML IJ SOLN
INTRAMUSCULAR | Status: AC
  Administered 2012-03-06: 1 mg
  Filled 2012-03-06: qty 1
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
## 2012-03-06 NOTE — ED Notes (Signed)
Patient up in geri chair waiting for dinner.

## 2012-03-06 NOTE — Progress Notes (Addendum)
2:36pm- CSW spoke to [place], Counsellor at [facility] who stated that they would not be able to accept the pt.  Cordelia Pen stated that Emergency planning/management officer stated they could not meet his needs.  CSW called and left message with Wellsite geologist.  CSW will continue to f/u.  11:40am Per Medical Director's recommendation, CSW is f/u on ALF placement for pt.  CSW contacted [facility] at [phone] and spoke to [place].  Cordelia Pen stated that pt is not eligible for the memory care unit because the state requires 7 different dx in order to qualify for that specific level of care.  I requested that Cordelia Pen take a look at the pt chart notes to determine if he would be appropriate for ALF level of care there at [facility].  Cordelia Pen agreed to review the pt.    CSW will continue to follow and assist as necessary.  Vickii Penna, LCSWA [phone]  Clinical Social Work

## 2012-03-06 NOTE — ED Notes (Signed)
Pt in bed resting. Limited mobility as patient moves slowly and states "he has trouble moving". When given drink, pt prompted to drink from straw. After several min of prompting pt was still unsuccessful. Almost appeared as if he did not understand the concept of sucking through the straw. He would blow into it. Pt had no complications drinking from the actual cup. Scabs to both shin areas. Pt unable to verify what happened to legs, states he was putting santyl on them as ordered prior to coming into hospital. Pt is A&Ox3. Can perform serial activities as requested.

## 2012-03-06 NOTE — ED Notes (Signed)
Patient restless having trouble falling asleep after getting 1mg  Ativan IM.  Repositioned patient, offered toileting.

## 2012-03-06 NOTE — ED Notes (Signed)
Patient is on the phone with his case worker.

## 2012-03-06 NOTE — ED Notes (Addendum)
Patient is awake. Talking with sitter who is at bedside. Patient does not make any sense when he talks. Patient asking sitter about his bills and making sure she pays them. Sitter is redirecting him.

## 2012-03-06 NOTE — ED Notes (Signed)
Patient is resting comfortably, sitter at bedside  

## 2012-03-06 NOTE — Progress Notes (Addendum)
CSW spoke with medical director regarding patient needing memory care at alf. Medical director recommended further evaluation of patient dementia to determine more specific diagnosis. CSW discussed with EDP who plans to review patients clinical information to determine patient dementia diagnosis or if neurology consult is needed.   Catha Gosselin, Theresia Majors  [phone] .03/06/2012 1637pm   CSW and EDP discussed case, Nuerology consult recommended at this time.   Catha Gosselin, LCSWA  [phone] 03/06/2012 1637pm
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
## 2012-03-06 NOTE — Progress Notes (Signed)
[facility] ED CM re reviewed clinicals noting increase in K and pt on oral medication to manage

## 2012-03-07 LAB — GLUCOSE, CAPILLARY: Glucose-Capillary: 48 mg/dL — ABNORMAL LOW (ref 70–99)

## 2012-03-07 MED ORDER — DIPHENHYDRAMINE HCL 25 MG PO CAPS
25.0000 mg | ORAL_CAPSULE | Freq: Once | ORAL | Status: AC
  Administered 2012-03-07: 25 mg via ORAL
  Filled 2012-03-07: qty 1

## 2012-03-07 MED ORDER — LORAZEPAM 1 MG PO TABS
1.0000 mg | ORAL_TABLET | Freq: Once | ORAL | Status: AC
  Administered 2012-03-07: 1 mg via ORAL
  Filled 2012-03-07: qty 1

## 2012-03-07 NOTE — Progress Notes (Signed)
CSW spoke with EDP who stated that he does feel that patient can be handled in assisted living memory care unit and pt has Alzheimer's dementia. CSW spoke with [facility] who are considering patient for memory care ALF. Per discussion with patient nurses patient has been compliant with medications while in the emergency department and aggressive behaviors have ceased with patient being redirectable.   Catha Gosselin, LCSWA  [phone] .03/07/2012 1614pm

## 2012-03-07 NOTE — Progress Notes (Signed)
LOS recommendations: Refer to ED SW note for 03/06/12, EDP assist with clarification of cognitive diagnosis, refer to [facility]

## 2012-03-07 NOTE — ED Provider Notes (Addendum)
Patient seen by me this morning. Patient very alert but also very confused. In review of all the previous notes. Patient appears to have psychiatric and cognizant detriments or impairments consistent with Alzheimer type disease. Patient would probably be most appropriately placed to [facility] for further evaluation.  Shelda Jakes, MD 03/07/12 1154  Clarification and some confusion on my part. Patient does not require admission to [facility] however he does have significant memory impairment probably does require admission to a memory unit of some sort do not feel that he could handle assisted living he would require redirection significantly. But could perhaps be placed in a memory unit nursing home-type environment.  Shelda Jakes, MD 03/07/12 1551  Classification again in discussion with the social worker. Patient based on the criteria that they use is eligible only for memory unit assisted living. Does not meet criteria for full nursing home-type environment. In discussion with them I agree with their assessment and patient came to 2 and assisted living environment.  Shelda Jakes, MD 03/07/12 [phone]

## 2012-03-07 NOTE — Progress Notes (Addendum)
Pt complaining of itching to back and is having increased agitation. Back examined and no rash noted. Dr. Silverio Lay notifed and orders received.

## 2012-03-07 NOTE — ED Notes (Signed)
Pt behaving calming. Very cooperative. Confused but easily redirectable. No prn medications used for agitation or anxiety. Pt currently in bed watching TV.

## 2012-03-07 NOTE — ED Notes (Signed)
Pt becoming aggitated stating that his if not getting his medications he needs to rest and for pain. Explained to the pt that the previous nurse administered Ativan per his request at 1730. Informed pt that he would be able to get his scheduled night meds at 2130. Pt still agitated and security and [employer] called to help calm the pt. Pt states that the staff "just doesn't have any brains". Sitter at bedside

## 2012-03-08 LAB — GLUCOSE, CAPILLARY: Glucose-Capillary: 94 mg/dL (ref 70–99)

## 2012-03-08 NOTE — Progress Notes (Signed)
CSW left message for Cordelia Pen at [facility].   CSW referred patient to [facility] however no male beds available.   CSW contacted [place] [facility] living, and at this time they do not feel they can meet pt needs of Alzheimer's care.  CSW left message for Patrick Holt house.   Pt denied at [employer], [place], [address], [address], [place], and [facility].   Catha Gosselin, LCSWA  [phone] .03/08/2012 13:49

## 2012-03-08 NOTE — Progress Notes (Signed)
CSW initiated state wide alf search for memory care unit.   .Clinical social worker continuing to follow pt to assist with pt dc plans and further csw needs.   Catha Gosselin, LCSWA  [phone] 03/08/2012 1750pm

## 2012-03-08 NOTE — ED Provider Notes (Signed)
8:24 AM Filed Vitals:   03/08/12 0521  BP: 115/73  Pulse: 69  Temp: 98 F (36.7 C)  Resp: 18   No complaints this AM. Awaiting placement  Lyanne Co, MD 03/08/12 [phone]

## 2012-03-08 NOTE — Progress Notes (Signed)
CSW received bed offer for patient to [facility] where patient Alzhiemer's needs can be met. Per facility, pt to staff ratio is low and patient with dementia are able to be managed.   CSW submitted patient pasarr with dementia as primary diagnosis.   Pt discharge pending pasarr number to be assigned.   Catha Gosselin, LCSWA  [phone] 03/08/2012 1936pm

## 2012-03-09 DIAGNOSIS — F259 Schizoaffective disorder, unspecified: Secondary | ICD-10-CM

## 2012-03-09 DIAGNOSIS — F09 Unspecified mental disorder due to known physiological condition: Secondary | ICD-10-CM

## 2012-03-09 LAB — GLUCOSE, CAPILLARY
Glucose-Capillary: 111 mg/dL — ABNORMAL HIGH (ref 70–99)
Glucose-Capillary: 125 mg/dL — ABNORMAL HIGH (ref 70–99)

## 2012-03-09 MED ORDER — SALINE SPRAY 0.65 % NA SOLN
1.0000 | NASAL | Status: DC | PRN
  Administered 2012-03-09: 1 via NASAL
  Filled 2012-03-09: qty 44

## 2012-03-09 MED ORDER — AMIODARONE HCL 200 MG PO TABS: 200.0000 mg | ORAL_TABLET | Freq: Every day | ORAL | Status: DC

## 2012-03-09 MED ORDER — DIPHENHYDRAMINE HCL 25 MG PO CAPS
25.0000 mg | ORAL_CAPSULE | Freq: Once | ORAL | Status: AC
  Administered 2012-03-09: 25 mg via ORAL
  Filled 2012-03-09: qty 1

## 2012-03-09 MED ORDER — QUETIAPINE FUMARATE 200 MG PO TABS
200.0000 mg | ORAL_TABLET | Freq: Every day | ORAL | Status: DC
Start: 2012-03-09 — End: 2012-03-22

## 2012-03-09 NOTE — Progress Notes (Signed)
CSW updated pasarr with requested additional information. CSW awaiting passarr Number for placement.   CSW returned call from APS social worker Salome Spotted at [phone] and left message. CSW awaiting return call.   CSW received the following bed offers for patient:   Pharmacist, hospital, [facility] [place],  [place]  [facility] [place], [place] [facility] [place], [place] [address] [facility] [place], [place]  [facility] [place], [place]   CSW is discussing patient with all places to determine if patient needs will be met to help determine which facility patient will be placed.   Pt will need guardianship to be pursued by facility.   Catha Gosselin, LCSWA  [phone] .03/09/2012 01:09pm

## 2012-03-09 NOTE — ED Notes (Signed)
Pt anxious.  Repeatedly asking to see Child psychotherapist. Belenda Cruise, SW, in meeting at present. Pt made aware. Pt insistent that he wants to go home today before dark.

## 2012-03-09 NOTE — Consult Note (Signed)
Reason for Consult: Schizoaffective disorder, psychosis ,partial compliant with medication and cognitive deficits Referring Physician: Dr. Jackolyn Confer Patrick Holt is an 62 y.o. male.  HPI: Patient has been stable on his current medication regimen. Patient does not have a symptoms of mania, depression or psychosis. Patient has no irritability, agitation and aggressive behavior. Patient denied suicidal, homicidal ideation. Patient has completed Mini-Mental Status Examination and the got 25/30. Patient is requesting to be discharged home, because he has a business to take care of it and is willing to be going to the assisted-living facility after that. Patient will followup with the home health care and social worker and made a shoe possibility of for assisted living facility.  MSE: Patient was calm, quite cooperative. He was served with the good mood, with the appropriate bright full affect. He has normal speech and thought processes. No suicidal or homicidal ideation. He has no evidence of psychotic symptoms.  Past Medical History  Diagnosis Date  . Rheumatoid arthritis   . Obesity   . Major depression     with acute psychotic break in 06/2010  . Diabetes mellitus   . Hypertension   . Hyperlipidemia   . Diverticulosis of colon   . COPD (chronic obstructive pulmonary disease)   . Anxiety   . GERD (gastroesophageal reflux disease)   . Vertigo   . Seizures   . Fibromyalgia   . Dermatomyositis   . Myocardial infarct     mulitple (1999, 2000, 2004)  . Raynaud's disease   . Narcotic dependence   . Peripheral neuropathy   . Internal hemorrhoids   . Ischemic heart disease   . Hiatal hernia   . Gastritis   . Diverticulitis   . Hx of adenomatous colonic polyps   . Nephrolithiasis   . Anemia   . Esophageal stricture   . Esophageal dysmotility   . Dermatomyositis   . Paroxysmal a-fib   . Urge incontinence   . Otosclerosis   . Bipolar 1 disorder   . OCD (obsessive compulsive  disorder)     Past Surgical History  Procedure Date  . Knee arthroscopy w/ meniscal repair 2009    left  . Lumbar disc surgery   . Squamous papilloma  2010    removed by Dr. Pollyann Kennedy ENT, noted on tongue    Family History  Problem Relation Age of Onset  . Alcohol abuse Mother   . Depression Mother   . Heart disease Mother   . Diabetes Mother   . Stroke Mother   . Diabetes Other     1/2 brother    Social History:  reports that he has been smoking Cigarettes.  He has been smoking about .5 packs per day. He has never used smokeless tobacco. He reports that he drinks about .6 ounces of alcohol per week. He reports that he does not use illicit drugs.  Allergies:  Allergies  Allergen Reactions  . Betamethasone Dipropionate     Unknown  . Bupropion Hcl     Unknown  . Ciprofloxacin     REACTION: swelling  . Clobetasol     Unknown  . Codeine     Unknown  . Escitalopram Oxalate     Unknown  . Fluoxetine Hcl     Unknown  . Fluticasone-Salmeterol     Unknown  . Furosemide     Unknown  . Immune Globulins     Acute renal failure  . Metronidazole     REACTION: swelling  .  Paroxetine     Unknown  . Penicillins     Unknown  . Statins     Unknown  . Tacrolimus     Unknown  . Tetanus Toxoid     Unknown  . Tuberculin Purified Protein Derivative     Unknown    Medications: I have reviewed the patient's current medications.  Results for orders placed during the hospital encounter of 02/29/12 (from the past 48 hour(s))  GLUCOSE, CAPILLARY     Status: Abnormal   Collection Time   03/08/12  7:48 AM      Component Value Range Comment   Glucose-Capillary 62 (*) 70 - 99 mg/dL   GLUCOSE, CAPILLARY     Status: Abnormal   Collection Time   03/08/12 10:07 AM      Component Value Range Comment   Glucose-Capillary 63 (*) 70 - 99 mg/dL   GLUCOSE, CAPILLARY     Status: Normal   Collection Time   03/08/12  5:26 PM      Component Value Range Comment   Glucose-Capillary 94  70 -  99 mg/dL   GLUCOSE, CAPILLARY     Status: Abnormal   Collection Time   03/09/12 12:51 AM      Component Value Range Comment   Glucose-Capillary 111 (*) 70 - 99 mg/dL   GLUCOSE, CAPILLARY     Status: Normal   Collection Time   03/09/12  6:50 AM      Component Value Range Comment   Glucose-Capillary 84  70 - 99 mg/dL   GLUCOSE, CAPILLARY     Status: Abnormal   Collection Time   03/09/12 11:57 AM      Component Value Range Comment   Glucose-Capillary 125 (*) 70 - 99 mg/dL     No results found.  Positive for sleep disturbance and paranoia and non compliance with medication and poor psychosocial support. Blood pressure 125/77, pulse 70, temperature 98.3 F (36.8 C), temperature source Oral, resp. rate 18, SpO2 94.00%.   Assessment/Plan: Schizoaffective disorder Partially compliant with medication Cognitive deficits  Recommended to the chest is outpatient psychiatric services with the home healthcare and social services regarding feature assisted living facility the store, LCSW contacting the adult protective services and they're recommending the guardianship at this time. No medication changes made during this visit   Chrishaun Sasso,JANARDHAHA R. 03/09/2012, 3:32 PM

## 2012-03-09 NOTE — ED Notes (Signed)
Put cream on pt lower back and bottom

## 2012-03-09 NOTE — Progress Notes (Signed)
Confirmed fax to [facility] at [facility] for resuming of home health orders received

## 2012-03-09 NOTE — Progress Notes (Signed)
[facility] ED CM consulted by ED SW to assistance for home health, dr appointments and [employer] Cm services/update.  1620 Cm spoke with Angelique Blonder at [facility] family practice (dr Berline Chough)  to schedule pt an appointment Wednesday,  March 22 2012 at 215 pm Pt agreed to this appointment time and day

## 2012-03-09 NOTE — ED Provider Notes (Signed)
  Physical Exam  BP 116/76  Pulse 65  Temp 98.6 F (37 C) (Oral)  Resp 16  SpO2 92%  Physical Exam  ED Course  Procedures  MDM Patient sitting in bed comfortably this morning. Pending placement.       Juliet Rude. Rubin Payor, MD 03/09/12 0800

## 2012-03-09 NOTE — Progress Notes (Signed)
Noted in EPIC pt has a CV appt with scott weaver on 02/29/12 but pt has been hospitalized.  CM spoke with Marlowe Kays and made another appointment for February 3 2-14 at 3: 20 Pm with Scott weaver or PA --Pt updated and list of appointments and follow ups provided on d/c sheet plus separate sheet to prevent lost of appointments

## 2012-03-09 NOTE — ED Notes (Signed)
Patient is resting comfortably. 

## 2012-03-09 NOTE — ED Notes (Signed)
Pt verbalizes undersatnding.  Act team and SW reviewed pt discharge instructions at bedside

## 2012-03-09 NOTE — Progress Notes (Signed)
[facility] ED CM spoke Rexene Edison oft [employer] senior Bridge Care transition program [phone] to inform her of pt d/c on 03/09/12 to home and stabilized on medications.  Discussed his follow up appointments with her. Tangela to call CM to provided fax number to finish his referral Tangela had closed pt's case on Monday after informed she states by her staff he was going to "[place]" Pt  states he has a walker and a powered wheelchair at home

## 2012-03-09 NOTE — ED Notes (Signed)
MD at bedside.  Dr Rubin Payor at bedside w/ pt. Pt requesting to go home today. Wants to see his Child psychotherapist.

## 2012-03-09 NOTE — Progress Notes (Signed)
Pt has small amt of bloody drainge from nose on tissue. No active bleeding from nose noted at present. Pt stated he had to insert tissue deep into his nose as he was "stuffed up and couldn't breathe." Pt also stating that he hasn't seen a physician "seen I got to this place" and that he wants "my papers to sign myself out if no doctor is going to see me."  Will inform Dr Rubin Payor. Pt aware. Pt also instructed to refrain from inserting tissue into his nose and that this writer will request saline nasal spray for pt.

## 2012-03-09 NOTE — Progress Notes (Addendum)
CSW met with pt at bedside to complete MMSE. Pt completed MMSE with score of 30/30. Pt reported to CSW when all of his bills are due, that he would like to get home before dark incase his pipes have busted due to severe cold weather, and that he has valuable items in his home he would like to sort through before going to assisted living. Pt reports that he is willing to go to assisted living when medically stable and is willing to work with a Child psychotherapist through home health to assist with that process. CSW discussed case with EDP and on site psychiatrist who agreed that patient has capacity at this time to make decisions. ED nurse case manager to discuss home health agencies with patient. CSW left message with Juventino Slovak at [facility] regarding pt returning home to follow up with patient.   Catha Gosselin, LCSWA  [phone] .03/09/2012 1515  CSW discharging home with taxi voucher. CSW provided patient with sweat pants and shirt as patient does not have warm clothes here at the hosptial.   .Catha Gosselin, LCSWA  [phone] .03/09/2012 1625pm

## 2012-03-09 NOTE — Progress Notes (Signed)
[facility] ED CM received a return call from [place] of [employer] senior bridge program to state she has spoken with Olegario Messier brooks to confirm pt will be re assigned to the Long term In Home Program. Also a Meryl Glendale Chard will be consulted CM will prepare referral information for Tangela to pick up from [facility] ED on 03/10/12 from CM

## 2012-03-09 NOTE — Progress Notes (Signed)
Ok Anis RRT, R%Cp gave PT combivent 1 puff as ordered. Unsure as to why it appears on my worklist as needing to administer- documentation is available for [phone] treatment.

## 2012-03-09 NOTE — ED Notes (Signed)
Belenda Cruise, LCSW, at bedside w/ pt.

## 2012-03-09 NOTE — Progress Notes (Signed)
[facility] ED Cm spoke with Tamala B[facility] of [facility] about resuming pt home health services Aware pt has [facility] and CM will re fax updated progress note, facesheet and orders to [phone]

## 2012-03-09 NOTE — [facility] Suicide Risk Assessment (Signed)
Suicide Risk Assessment  Discharge Assessment     Demographic Factors:  Male, Adolescent or young adult, Caucasian, Low socioeconomic status, Living alone and Unemployed  Mental Status Per Nursing Assessment::   On Admission:     Current Mental Status by Physician: NA  Loss Factors: Decline in physical health and Financial problems/change in socioeconomic status  Historical Factors: NA  Risk Reduction Factors:   Sense of responsibility to family, Religious beliefs about death and Positive therapeutic relationship  Continued Clinical Symptoms:  Schizophrenia:   Paranoid or undifferentiated type Previous Psychiatric Diagnoses and Treatments Medical Diagnoses and Treatments/Surgeries  Cognitive Features That Contribute To Risk:  Polarized thinking    Suicide Risk:  Minimal: No identifiable suicidal ideation.  Patients presenting with no risk factors but with morbid ruminations; may be classified as minimal risk based on the severity of the depressive symptoms  Discharge Diagnoses:   AXIS I:  Schizoaffective Disorder AXIS II:  Deferred AXIS III:   Past Medical History  Diagnosis Date  . Rheumatoid arthritis   . Obesity   . Major depression     with acute psychotic break in 06/2010  . Diabetes mellitus   . Hypertension   . Hyperlipidemia   . Diverticulosis of colon   . COPD (chronic obstructive pulmonary disease)   . Anxiety   . GERD (gastroesophageal reflux disease)   . Vertigo   . Seizures   . Fibromyalgia   . Dermatomyositis   . Myocardial infarct     mulitple (1999, 2000, 2004)  . Raynaud's disease   . Narcotic dependence   . Peripheral neuropathy   . Internal hemorrhoids   . Ischemic heart disease   . Hiatal hernia   . Gastritis   . Diverticulitis   . Hx of adenomatous colonic polyps   . Nephrolithiasis   . Anemia   . Esophageal stricture   . Esophageal dysmotility   . Dermatomyositis   . Paroxysmal a-fib   . Urge incontinence   . Otosclerosis     . Bipolar 1 disorder   . OCD (obsessive compulsive disorder)    AXIS IV:  economic problems, other psychosocial or environmental problems, problems related to social environment and problems with access to health care services AXIS V:  51-60 moderate symptoms  Plan Of Care/Follow-up recommendations:  Activity:  as tolerated Diet:  regular  Is patient on multiple antipsychotic therapies at discharge:  No   Has Patient had three or more failed trials of antipsychotic monotherapy by history:  No  Recommended Plan for Multiple Antipsychotic Therapies: Not applicable  Rianne Degraaf,JANARDHAHA R. 03/09/2012, 3:47 PM

## 2012-03-10 NOTE — Progress Notes (Signed)
Cm did not receive a call from Tangela to pick up referral information on pt CM left referral information at [facility] ED triage window to be given to [facility] if she arrives

## 2012-03-10 NOTE — Progress Notes (Signed)
03/10/12 Cm called by Tamala Bari of [facility] to report that [facility] will not be able to accept referral for pt.  She stated she called [place] but she was informed that [place] and [facility] could not assist pt. 03/10/12 at 1530 CM called to check on pt He reports his house is "in a shambles" He states he had a call from [facility] medicare cm He reports his ex mother in law has been called but will not be able to visit him until "this afternoon" CM had to orient pt to the day, Friday March 10 2012 He thought the day was March 11 2012.  He reports taking a cab to his pharmacy to check on his medications  Pt reports missing medications nexium and apresoline. When CM discussed assisted living facility,  pt states he is willing to go to assisted living facility but not until February 2014. Pt states he recalls telling staff at hospital the same thing.   1549 Cm confirmed with [facility] that they do not have any available BH RNs at this time. CM spoke with CM leadership about other possible resources for pt CM will check with ED SW on outpatient resources available other than [employer] CM services.  Pt refused private duty nursing services because of out of pocket expenses on 03/09/12.

## 2012-03-13 ENCOUNTER — Telehealth: Payer: Self-pay | Admitting: *Deleted

## 2012-03-13 NOTE — Progress Notes (Signed)
[facility]MA home health benefit check results indicated in network agencies are  [facility], [facility] and [facility].  ED CM has already received response form [facility] and [facility] that pt is not a candidate for services CM received [facility] responses from [facility] and a verbal response from dementia care program [facility]- does not service [place] area) Pending responses from [facility], [facility], [facility] , [facility]

## 2012-03-13 NOTE — Telephone Encounter (Signed)
PA required  for Oxybutynin 5 mg.  One three times daily.   Form placed in  Dr. Janeece Riggers box.

## 2012-03-13 NOTE — Progress Notes (Signed)
Benefit check requested of CMA. Cm sent referral to [facility], [facility], [facility] care program, [facility], [facility], [facility] via [facility] for possible home care services acceptance Pending Responses

## 2012-03-13 NOTE — Progress Notes (Signed)
[facility] ED CM received 2 voice messages from [place] at [place], [place] Cm about the referral information she requested CM to leave for her at [facility] ED triage area.  Cm spoke with Tangela at 1059 03/13/12 who confirms [facility] ED nursing staff gave her the referral information CM had left for her in [facility] ED triage as she requested. CM updated and clarified with Tangela that [facility] staff member Tamala Bari was notified of second referral and conversation Cm had with Corrie Dandy and Cm supervisor on 03/11/12.  Tangela states she is at the pt's home at the time CM spoke with her.  CM also provided Tangela ED SW contact number to receive her answers about assisted living services.  1145 03/13/12 Cm received another call from Tangela requesting pt's d/c medications Apresoline and xanax be called in to [facility]

## 2012-03-13 NOTE — Progress Notes (Signed)
[facility] ED CM spoke with Danford Bad at [facility] [phone] to call in the xanax 1 mg tid as needed for anxiety as listed on [facility] d/c medication list to continue at discharge per Dr Mervyn Gay

## 2012-03-14 NOTE — Progress Notes (Signed)
[facility] ED CM noted no further home health service responses CM consulted with Angelique Blonder at Dr Berline Chough office about a Child psychotherapist or case worker that [employer] may be about to consult with for progression of services to assist pt CM offered Norma at [phone].

## 2012-03-14 NOTE — Progress Notes (Signed)
CM left a voice message for Patrick Holt requesting a return call Pending

## 2012-03-14 NOTE — Progress Notes (Signed)
ED CM consulted with T Orson Aloe of [facility].  Pt is unable to be engaged at home at this time due to his BH hx.  Consult appreciated

## 2012-03-14 NOTE — Progress Notes (Signed)
[facility] ED CM received a return call back from [facility] SW covering for [place] in [facility] outpatient center). CM reviewed pt 02/23/12 d/c from mc, 02/29/12 to 03/09/12 stay at [facility], [facility] unable to provide services and CM recommendation from Cm leadership to see if pt's pcp office had a SW member that could continue to offer possible services to pt from pcp office (to continue to offer assisted living as an option to pt) Pt is scheduled for f/u appointment 03/22/12 with Dr Berline Chough. Meagan provided MRN to review to see if assistance available and to discuss with Nelva Bush

## 2012-03-15 ENCOUNTER — Other Ambulatory Visit: Payer: Self-pay | Admitting: Sports Medicine

## 2012-03-15 ENCOUNTER — Telehealth: Payer: Self-pay | Admitting: Clinical

## 2012-03-15 MED ORDER — ALPRAZOLAM 1 MG PO TABS: 1.0000 mg | ORAL_TABLET | Freq: Three times a day (TID) | ORAL | Status: DC | PRN

## 2012-03-15 MED ORDER — OXYBUTYNIN CHLORIDE 5 MG PO TABS: 5.0000 mg | ORAL_TABLET | Freq: Three times a day (TID) | ORAL | Status: DC

## 2012-03-15 MED ORDER — METHOTREXATE 2.5 MG PO TABS: 7.5000 mg | ORAL_TABLET | ORAL | Status: DC

## 2012-03-15 NOTE — Progress Notes (Signed)
[facility] ED CM spoke with [facility] outpatient clinic SW on 03/15/12 1034 about seeing pt possibly during his pcp appointment on 03/22/12 CM discussed this with Benard Rink ED SW at 1440. 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 He was able to redirected himself after he started speaking about his [employer] bill but later began talking about another bill. CM inquired about [facility] staff. States he has recently spoken with Salome Spotted ([facility]) about assisted living but does not want to go "right now" Still agreeing to assisted living and concerned with his belongings expensive paintings SW from [facility] called 03/15/12 about [facility] He had call from [facility] to start services which are pending He talked to his mail man about his interaction with him prior to coming in [facility] on 02/29/12. Pt requested Cm enter in his records that he is not an alcoholic (he reports he has been told by medical provider to drink red wine with lots of ice per his doctor at [facility] at the time) and that the mail man confirmed "I did not hit him but he noticed I was showing strange behavior like picking up things not there on my porch railings" States he noted these 2 errors in his d/c records sent home with him Pt informed that Cm spoke with Angelique Blonder at Dr Berline Chough office and Cm encouraged him to review other medical concerns he believes are errors on his record during the upcoming appointment. He has called [facility] to transport him (uses a [facility] punch card). Pt now concerned about getting his dental plate repaired after issues with "a fall"  CM discussed with pt the attempts to find a home health to see him but unable to find one to at this time.  CM at interval made  attempts to redirect pt with success 50% of the time. Pt agrees to have [facility] SW to see him during appointment at Dr Berline Chough office to check on him about Assisted living and dental concerns Pt informed CM he appreciated her calling, all her help and if he does not answer when she calls again to try his cell number of [phone]

## 2012-03-15 NOTE — Telephone Encounter (Signed)
CSW informed ED CM Kim that this CSW will attempt follow up with pt at his appointment with his PCP next week. This CSW is familiar with this pt who has been noncompliant in the past. CSW will facilitate with ALF placement if pt remains agreeable. CSW appreciates the support of the ED CM who is searching for a [facility] agency that is willing to provide pt with services.  Theresia Bough, MSW, Theresia Majors [phone]

## 2012-03-16 NOTE — Telephone Encounter (Signed)
Completed and placed in to be faxed 

## 2012-03-17 ENCOUNTER — Telehealth: Payer: Self-pay | Admitting: Cardiology

## 2012-03-17 NOTE — Telephone Encounter (Signed)
App time and date confirmed. Chart reviewed, no current complaints on return call, pt agreed to date and time of app.

## 2012-03-17 NOTE — Telephone Encounter (Signed)
New Problem     Pt just got released from hospital. Called to confirm appt with Tereso Newcomer.  However, pt is c/o he is having a hard time remembering, very weak, fell last night, took him a very long time to do  Normal ADL's today.

## 2012-03-20 ENCOUNTER — Encounter: Payer: Medicare HMO | Admitting: Physician Assistant

## 2012-03-20 NOTE — Progress Notes (Signed)
Late Entry:   Patient [facility] social Worker  Juventino Slovak Work Cell: [phone] Personal cell [phone].   CSW spoke with patient guardian on Thursday 03/16/2012 who stated that patient was at home and she was going to visit patient to see if patient was safe to remain at home. Ms. Mayford Knife stated she would return call if patient was a danger to himself or others at home, however CSW did not receive a return call.   This Clinical research associate and APS Child psychotherapist Ms. Turner discussed that when patient was discharged he was felt to have capacity and was insistent on returning home but open to ALF in February.   This Clinical research associate forwarded information to [facility] Medicine teaching service CSW for further care of patient as patient is a patient at the outpatient clinic.   Catha Gosselin, LCSWA  [phone] .03/20/2012 03/20/2012

## 2012-03-21 ENCOUNTER — Telehealth: Payer: Self-pay | Admitting: Sports Medicine

## 2012-03-21 NOTE — Telephone Encounter (Signed)
Patient does not want to f/u with Mental Health appt and she said that he is scheduled to see [name] on Wednesday and she just wants [name] to know.

## 2012-03-22 ENCOUNTER — Ambulatory Visit (INDEPENDENT_AMBULATORY_CARE_PROVIDER_SITE_OTHER): Payer: Medicare HMO | Admitting: Sports Medicine

## 2012-03-22 ENCOUNTER — Encounter: Payer: Self-pay | Admitting: Sports Medicine

## 2012-03-22 VITALS — BP 109/75 | HR 74 | Temp 98.6°F | Ht 68.0 in | Wt 200.5 lb

## 2012-03-22 DIAGNOSIS — Z7289 Other problems related to lifestyle: Secondary | ICD-10-CM

## 2012-03-22 DIAGNOSIS — N3941 Urge incontinence: Secondary | ICD-10-CM

## 2012-03-22 DIAGNOSIS — Z79899 Other long term (current) drug therapy: Secondary | ICD-10-CM

## 2012-03-22 DIAGNOSIS — F17200 Nicotine dependence, unspecified, uncomplicated: Secondary | ICD-10-CM

## 2012-03-22 DIAGNOSIS — R52 Pain, unspecified: Secondary | ICD-10-CM

## 2012-03-22 DIAGNOSIS — Z609 Problem related to social environment, unspecified: Secondary | ICD-10-CM

## 2012-03-22 DIAGNOSIS — I4891 Unspecified atrial fibrillation: Secondary | ICD-10-CM

## 2012-03-22 DIAGNOSIS — M3390 Dermatopolymyositis, unspecified, organ involvement unspecified: Secondary | ICD-10-CM

## 2012-03-22 DIAGNOSIS — M069 Rheumatoid arthritis, unspecified: Secondary | ICD-10-CM

## 2012-03-22 DIAGNOSIS — IMO0001 Reserved for inherently not codable concepts without codable children: Secondary | ICD-10-CM

## 2012-03-22 DIAGNOSIS — I10 Essential (primary) hypertension: Secondary | ICD-10-CM

## 2012-03-22 DIAGNOSIS — F29 Unspecified psychosis not due to a substance or known physiological condition: Secondary | ICD-10-CM

## 2012-03-22 MED ORDER — DIVALPROEX SODIUM 500 MG PO DR TAB: 500.0000 mg | DELAYED_RELEASE_TABLET | Freq: Two times a day (BID) | ORAL | Status: DC

## 2012-03-22 MED ORDER — MIRABEGRON ER 25 MG PO TB24: 25.0000 mg | ORAL_TABLET | Freq: Every day | ORAL | Status: DC

## 2012-03-22 MED ORDER — QUETIAPINE FUMARATE 200 MG PO TABS: 200.0000 mg | ORAL_TABLET | Freq: Every day | ORAL | Status: DC

## 2012-03-22 NOTE — Patient Instructions (Addendum)
It was good to see you.  I am glad that you feel like you are doing better now.    Patrick Holt will be calling you to discuss assisted living options with you; I highly recommend you think about this as an option.  I will try to refer you to Pain management again to address your chronic back pain  YOU HAVE TO SCHEDULE AN APPOINTMENT with the [facility] for your Psychiatric Care.  I am no longer able to prescribe your psychiatric medications including your Xanax  Their number is [phone]  Their Address is: [address] [place] [address]  I have changed your bladder medicine to Myrbetriq.  Hopefully this will be approved.   Please follow up in 1 month

## 2012-03-23 ENCOUNTER — Encounter: Payer: Self-pay | Admitting: Physician Assistant

## 2012-03-23 ENCOUNTER — Ambulatory Visit (INDEPENDENT_AMBULATORY_CARE_PROVIDER_SITE_OTHER): Payer: Medicare HMO | Admitting: Physician Assistant

## 2012-03-23 VITALS — BP 132/78 | HR 62 | Ht 68.0 in

## 2012-03-23 DIAGNOSIS — Z79899 Other long term (current) drug therapy: Secondary | ICD-10-CM

## 2012-03-23 DIAGNOSIS — I4891 Unspecified atrial fibrillation: Secondary | ICD-10-CM

## 2012-03-23 DIAGNOSIS — I10 Essential (primary) hypertension: Secondary | ICD-10-CM

## 2012-03-23 DIAGNOSIS — I2510 Atherosclerotic heart disease of native coronary artery without angina pectoris: Secondary | ICD-10-CM

## 2012-03-23 NOTE — Progress Notes (Signed)
[address] [place], [place]  [address] Phone: [phone], Fax:  [phone]  Date:  03/23/2012   ID:  Patrick, Holt 1950-03-26, MRN [id number]  PCP:  Gaspar Bidding, DO  Primary Cardiologist:  Dr. Valera Castle     History of Present Illness: Patrick Holt is a 62 y.o. male who returns for post hospital follow up.  Patient has a reported history of CAD although we have no record of this. He also notes that he has been seen in the past by Dr. wall. There is no record of this. He did see Dr. Shirlee Latch once in 2011 in consultation. Dr. Tenny Craw saw him in the hospital in 12/13 when he was admitted after a fall (likely do to seizure) and noted to be in atrial fibrillation with RVR. Other history includes DM2, HTN, COPD, rheumatoid arthritis, dermatomyositis. MRI demonstrated a subarachnoid vs. subdural hematoma.  He was not felt to be a candidate for anticoagulation. 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 He was placed on amiodarone to help maintain sinus rhythm. He was noted to have nocturnal hypoxia and an outpatient sleep study was also recommended. Patient also has psychiatric disorder (schizoaffective disorder) and was admitted in late December for psychotic break. He was followed by psychiatry during that admission with adjustments in his medications.  Echo 12/13: Mild LVH, vigorous LV, EF 65-70%, grade 1 diastolic dysfunction, mild LAE, mild aortic stenosis, mean gradient 10.  Patient denies any further palpitations since leaving the hospital. Denies chest pain, significant dyspnea, orthopnea, PND. Denies significant pedal edema.  Labs (12/13):  TSH 0.398 Labs (1/14):     K 3.4, creatinine 0.9, ALT 20, hemoglobin 14.2  Wt Readings from Last 3 Encounters:  03/22/12 200 lb 8 oz (90.946 kg)  02/20/12 191 lb 12.8 oz (87 kg)  02/03/12 232 lb 9.4 oz (105.5 kg)     Past Medical History  Diagnosis Date    . Rheumatoid arthritis   . Obesity   . Major depression     with acute psychotic break in 06/2010  . Diabetes mellitus   . Hypertension   . Hyperlipidemia   . Diverticulosis of colon   . COPD (chronic obstructive pulmonary disease)   . Anxiety   . GERD (gastroesophageal reflux disease)   . Vertigo   . Seizures   . Fibromyalgia   . Dermatomyositis   . Myocardial infarct     mulitple (1999, 2000, 2004)  . Raynaud's disease   . Narcotic dependence   . Peripheral neuropathy   . Internal hemorrhoids   . Ischemic heart disease   . Hiatal hernia   . Gastritis   . Diverticulitis   . Hx of adenomatous colonic polyps   . Nephrolithiasis   . Anemia   . Esophageal stricture   . Esophageal dysmotility   . Dermatomyositis   . Paroxysmal a-fib   . Urge incontinence   . Otosclerosis   . Bipolar 1 disorder   . OCD (obsessive compulsive disorder)     Current Outpatient Prescriptions  Medication Sig Dispense Refill  . ALPRAZolam (XANAX) 1 MG tablet Take 1 tablet (1 mg total) by mouth 3 (three) times daily as needed. For anxiety  90 tablet  2  . amiodarone (PACERONE) 200 MG tablet Take 1 tablet (200 mg total) by mouth daily.  30 tablet  3  . aspirin EC 81 MG tablet Take 81 mg by mouth  daily.      . collagenase (SANTYL) ointment Apply 1 application topically daily.      . diclofenac (VOLTAREN) 75 MG EC tablet Take 75 mg by mouth 2 (two) times daily as needed. For back pain      . divalproex (DEPAKOTE) 500 MG DR tablet Take 1 tablet (500 mg total) by mouth every 12 (twelve) hours.  60 tablet  0  . DULoxetine (CYMBALTA) 60 MG capsule Take 60 mg by mouth 2 (two) times daily.       Marland Kitchen esomeprazole (NEXIUM) 40 MG capsule Take 40 mg by mouth daily before breakfast.      . glipiZIDE (GLUCOTROL XL) 5 MG 24 hr tablet Take 5 mg by mouth daily.      . Ipratropium-Albuterol (COMBIVENT) 20-100 MCG/ACT AERS respimat Inhale 1 puff into the lungs every 6 (six) hours.      Marland Kitchen  lisinopril-hydrochlorothiazide (PRINZIDE,ZESTORETIC) 10-12.5 MG per tablet Take 1 tablet by mouth daily.      . magnesium oxide (MAG-OX) 400 MG tablet Take 400 mg by mouth daily.      . methotrexate (RHEUMATREX) 2.5 MG tablet Take 3 tablets (7.5 mg total) by mouth once a week.  12 tablet  1  . metoprolol (LOPRESSOR) 50 MG tablet Take 50 mg by mouth 2 (two) times daily.      . mirabegron ER (MYRBETRIQ) 25 MG TB24 Take 1 tablet (25 mg total) by mouth daily.  30 tablet  1  . niacin (NIASPAN) 1000 MG CR tablet Take 1,000 mg by mouth at bedtime.      . nitroGLYCERIN (NITROSTAT) 0.4 MG SL tablet Place 0.4 mg under the tongue every 5 (five) minutes as needed. For chest pain      . QUEtiapine (SEROQUEL) 200 MG tablet Take 1 tablet (200 mg total) by mouth at bedtime.  30 tablet  0  . thiamine (VITAMIN B-1) 100 MG tablet Take 100 mg by mouth daily.      . traZODone (DESYREL) 50 MG tablet Take 50 mg by mouth at bedtime.      . [DISCONTINUED] lisinopril-hydrochlorothiazide (PRINZIDE,ZESTORETIC) 10-12.5 MG per tablet Take 1 tablet by mouth daily.  30 tablet  11  . [DISCONTINUED] oxybutynin (DITROPAN) 5 MG tablet Take 1 tablet (5 mg total) by mouth 3 (three) times daily.  90 tablet  3    Allergies:    Allergies  Allergen Reactions  . Betamethasone Dipropionate     Unknown  . Bupropion Hcl     Unknown  . Ciprofloxacin     REACTION: swelling  . Clobetasol     Unknown  . Codeine     Unknown  . Escitalopram Oxalate     Unknown  . Fluoxetine Hcl     Unknown  . Fluticasone-Salmeterol     Unknown  . Furosemide     Unknown  . Immune Globulins     Acute renal failure  . Metronidazole     REACTION: swelling  . Paroxetine     Unknown  . Penicillins     Unknown  . Statins     Unknown  . Tacrolimus     Unknown  . Tetanus Toxoid     Unknown  . Tuberculin Purified Protein Derivative     Unknown    Social History:  The patient  reports that he has been smoking Cigarettes.  He has been smoking  about .5 packs per day. He has never used smokeless tobacco. He reports that he  drinks about .6 ounces of alcohol per week. He reports that he does not use illicit drugs.    PHYSICAL EXAM: VS:  BP 132/78  Pulse 62  Ht 5\' 8"  (1.727 m) Well nourished, well developed, in no acute distress HEENT: normal Neck: no JVD at 90 Cardiac:  normal S1, S2; RRR; 1/6 systolic murmur heard best at the right upper sternal border Lungs:  clear to auscultation bilaterally, no wheezing, rhonchi or rales Abd: soft, nontender, no hepatomegaly Ext: Trace-1+ bilateral LE edema Skin: warm and dry Neuro:  CNs 2-12 intact, no focal abnormalities noted  EKG:  NSR, HR 62, normal axis, poor R wave progression, nonspecific ST-T wave changes     ASSESSMENT AND PLAN:  1. Atrial Fibrillation:  Maintaining sinus rhythm. He admits to compliance with amiodarone. Recent LFTs were normal. Arrange TSH. Arrange PFTs with DLCO. He has been advised to get yearly eye exams. He is not a good candidate for oral anticoagulation. Continue aspirin for now. He knows to contact us if he develops palpitations or syncope/near-syncope.  Will eventually need to get him set up for a sleep study.  2. Coronary Artery Disease: He reports a prior history of MI. There are no records of this. He may continue aspirin. 3. Hypertension: Controlled area 4. Disposition: I will arrange followup with Dr. Tenny Craw in 3 months.  Signed, Tereso Newcomer, PA-C  1:59 PM 03/23/2012

## 2012-03-23 NOTE — Patient Instructions (Addendum)
Your physician recommends that you return for lab work in: today (tsh)  Your physician has recommended that you have a pulmonary function test. Pulmonary Function Tests are a group of tests that measure how well air moves in and out of your lungs.  Your physician recommends that you schedule a follow-up appointment in: 3 months with Dr Tenny Craw

## 2012-03-24 ENCOUNTER — Telehealth: Payer: Self-pay | Admitting: Physician Assistant

## 2012-03-24 NOTE — Telephone Encounter (Signed)
Pt was in yesterday,  Was precribed two meds and didn't get an rx nor were they called in pls call pt when called in at [phone] , pls [facility], will deliver at 2p if called in asap

## 2012-03-24 NOTE — Telephone Encounter (Signed)
cb tp today to see what medications he was needing refilled. pt said that Lorin Picket said he was calling in amiodarone for him. I said no that Lorin Picket said you had pills still in the bottle and when you were ready for refills to call pharmacy. Pt states PCP took him off a lot of meds and he threw them away but he does not know what these meds were. I advised when a medication is stopped to just put them aside, do not throw them away. I advised pt to look at his bottles and if he does not have the amiodarone that he will need to call his pharmacy and they will call us, pt said he was still not all together, and said he misunderstood. Pt did verbalize understanding

## 2012-03-24 NOTE — Telephone Encounter (Signed)
cb tp today to see what medications he was needing refilled. pt said that Scott said he was calling in amiodarone for him. I said no that Scott said you had pills still in the bottle and when you were ready for refills to call pharmacy. Pt states PCP took him off a lot of meds and he threw them away but he does not know what these meds were. I advised when a medication is stopped to just put them aside, do not throw them away. I advised pt to look at his bottles and if he does not have the amiodarone that he will need to call his pharmacy and they will call us, pt said he was still not all together, and said he misunderstood. Pt did verbalize understanding 

## 2012-03-26 DIAGNOSIS — Z79899 Other long term (current) drug therapy: Secondary | ICD-10-CM | POA: Insufficient documentation

## 2012-03-26 NOTE — Assessment & Plan Note (Signed)
Stable/Well Controlled - no changes at this time. 

## 2012-03-26 NOTE — Assessment & Plan Note (Signed)
RRR today.  Amio decreased due to interaction with seroquel.  Continue to monitor
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
## 2012-03-26 NOTE — Assessment & Plan Note (Signed)
On Methotrexate chronically.  Currently written by [facility]. Has been dismissed by multiple rheumatology offices in town.

## 2012-03-26 NOTE — Assessment & Plan Note (Signed)
FROM PREVIOUS VISITS: Patient states he was told by previous primary care physician that he is one of seven people in the entire country who cannot quit smoking because it will kill him.  Therefore he is not interested in stopping smoking.   DID NOT BROACH THIS VISIT

## 2012-03-26 NOTE — Assessment & Plan Note (Addendum)
Living independently.  No home health agencies will service his homes due to safety concerns.  Pt is potentially willing to go to ALF if able to bring particular large items from his home.  He does recogize his need for help. >Will have NORMA our social worker attempt to help with potential placement in ALF
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
## 2012-03-26 NOTE — Assessment & Plan Note (Addendum)
Pt unclear as to what meds are for what.  Also his pill bottles were opened when placed in his bag to bring with him and there was a large pile of mixed pills at the bottom of his med bag.  Concern that this may happen at home and that he is mistakingly taking the wrong dose/frequency of medications.  In house pharmacist assisted in replacing pills in correct bottles and medication education

## 2012-03-26 NOTE — Assessment & Plan Note (Signed)
Issues with obtaining Detrol due to medication reactions and insurance approval.  - Trial Myrbetriq

## 2012-03-28 ENCOUNTER — Telehealth: Payer: Self-pay | Admitting: Clinical

## 2012-03-28 NOTE — Telephone Encounter (Signed)
Clinical Child psychotherapist (CSW) contacted pt to discuss possible ALF placement as requested by PCP and previous ED admissions. CSW introduced herself to patient and purpose of call. CSW informed pt of recommendations for ALF placement to assist with pt medical needs. Pt initially seemed interested and stated the "[employer] social worker that came last Monday" mentioned placement to him. Pt could not provide a name or contact number for home health RN/CSW. Pt mentioned he has an extremely difficult time remembering things. CSW expressed concern whether pt has remembered to take his medication. Pt stated he has a weekly pill box that his ex-mother in-law refills for him.  CSW informed pt of benefits of placement that could assist pt with these concerns and provide pt with necessary support. CSW informed pt of placement process however pt stated he needed to have a private room and would not give up his social security check. CSW informed pt that Medicaid would not cover a private room (Per Amy Washington with [facility] a private room would be an extra $418/month.) CSW informed pt that he would keep $35-66 a month after his room/board is paid. Pt stated that would not be enough to pay for groceries/detergent to wash clothes etc. CSW informed pt that the facility would provide daily meals, take care of his laundry etc. CSW also informed pt that the facility has a physician and psychiatrist who would see pt in the facility. Pt also made aware that he would receive transportation to his appointments by the facility. Pt quickly stated he would not go to a facility unless he had a private room because he has "no immune system." Pt ended conversation with "I will not go to a facility!"   CSW has provided [facility] RNCM/CSW with the above information in the instance that pt arrives at the ED.  CSW has also left a message with APS worker: Salome Spotted [facility]  Work [phone] personal cell : [phone]  Theresia Bough, MSW, Theresia Majors [phone]

## 2012-04-03 ENCOUNTER — Telehealth: Payer: Self-pay | Admitting: *Deleted

## 2012-04-03 NOTE — Telephone Encounter (Signed)
Message copied by Tarri Fuller on Mon Apr 03, 2012 12:09 PM ------      Message from: Jefferey Pica      Created: Tue Mar 28, 2012  1:13 PM       I left a message for the patient to call. Ordered by Tereso Newcomer, PA ------

## 2012-04-03 NOTE — Telephone Encounter (Signed)
pt notified about normal lab results. pt states [employer]. PA increased xanax to TID, but PCP changed to QD, pt states PCP told him he will not fill any control drugs for him. pt asked if [facility]. PAC will refill xanax, says he needs a refill, I w/d/w PA

## 2012-04-03 NOTE — Telephone Encounter (Signed)
lmom per Tereso Newcomer, PAC No. I did not change his Xanax. This drug was prescribed by someone else and I would not change it nor will I refill it. Controlled substances should be managed by one provider only. We are managing his atrial fibrillation and CAD. We are not managing his anxiety. Tereso Newcomer, PA-C 2:04 PM 04/03/2012

## 2012-04-04 ENCOUNTER — Telehealth: Payer: Self-pay | Admitting: Internal Medicine

## 2012-04-04 NOTE — Telephone Encounter (Signed)
FolloW Up   Pt following up on phone call he received earlier today.

## 2012-04-05 NOTE — Telephone Encounter (Signed)
Pt is returning [employer].

## 2012-04-06 NOTE — Telephone Encounter (Signed)
lmom on cell, home # not working. see my phone note from 04/03/12 to pt.

## 2012-04-06 NOTE — Telephone Encounter (Signed)
lmom on cell, home # not working. see my phone note from 04/03/12 to pt. 

## 2012-04-19 ENCOUNTER — Ambulatory Visit: Payer: Medicare HMO | Admitting: Sports Medicine

## 2012-04-24 ENCOUNTER — Other Ambulatory Visit: Payer: Self-pay | Admitting: *Deleted

## 2012-04-24 MED ORDER — DIVALPROEX SODIUM 500 MG PO DR TAB: 500.0000 mg | DELAYED_RELEASE_TABLET | Freq: Two times a day (BID) | ORAL | Status: DC

## 2012-04-25 ENCOUNTER — Ambulatory Visit (INDEPENDENT_AMBULATORY_CARE_PROVIDER_SITE_OTHER): Payer: Medicare HMO | Admitting: Sports Medicine

## 2012-04-25 ENCOUNTER — Encounter: Payer: Self-pay | Admitting: Sports Medicine

## 2012-04-25 VITALS — BP 136/86 | HR 75 | Temp 99.9°F | Ht 68.0 in | Wt 191.0 lb

## 2012-04-25 DIAGNOSIS — M3390 Dermatopolymyositis, unspecified, organ involvement unspecified: Secondary | ICD-10-CM

## 2012-04-25 DIAGNOSIS — R52 Pain, unspecified: Secondary | ICD-10-CM

## 2012-04-25 DIAGNOSIS — Z7289 Other problems related to lifestyle: Secondary | ICD-10-CM

## 2012-04-25 DIAGNOSIS — I10 Essential (primary) hypertension: Secondary | ICD-10-CM

## 2012-04-25 DIAGNOSIS — E119 Type 2 diabetes mellitus without complications: Secondary | ICD-10-CM

## 2012-04-25 DIAGNOSIS — Z609 Problem related to social environment, unspecified: Secondary | ICD-10-CM

## 2012-04-25 DIAGNOSIS — F329 Major depressive disorder, single episode, unspecified: Secondary | ICD-10-CM

## 2012-04-25 NOTE — Patient Instructions (Signed)
It was good to see you. I am glad that you feel like you are doing better now.  YOU HAVE TO SCHEDULE AN APPOINTMENT with the [facility] for your Psychiatric Care. I am no longer able to prescribe your psychiatric medications including your Xanax  Their number is [phone]  Their Address is: [address], [place] [address]   Follow up in 4 weeks

## 2012-04-30 NOTE — Assessment & Plan Note (Signed)
Reports doing well. Has been denied at Pain Management.   Declines refills today.

## 2012-04-30 NOTE — Progress Notes (Signed)
  [facility]  Patient name: Patrick Holt MRN [id number]  Date of birth: [birth year]  CC & HPI:  Patrick Holt is a 62 y.o. male presenting today for follow up of:  #  Hypertension - chronic problem, well controlled.    Home BP checks/ranges: not checking  no orthostasis  no chest pain, no dyspnea on exertion, no orthopnea/PND, no peripheral edema,   no episodes of unilateral weakness, dysarthria or acute visual changes  #  Diabetes - chronic problem, very well controlled.   Blood Sugar checks/ranges: not checking consistently   no hypoglycemic symptoms/episodes.   no poluria, no polydipsia,  no new visual problems,   LE dysesthesias: Yes - consistent with prior chronic pain  self foot checks performed: unable to coherently answer  #  Hyperlipidemia - chronic problem, adequately controlled.     # Mental Status and acute Psychosis  Reports being off his medications and is the best he has felt.  Denies making follow up appointment with [facility].  Reports feeling the best he has.  See [facility] exam  # Chronic Pain: off pain medications and is feeling good.  Suspect due to acute psychosis.  ------------------------------------------------------------------------------------------------------------------ Medication Compliance: unsure of what medicaitons he has been taking.  Reports that non of his medications have written on them what they are for and therefore he doesn't know what his medicaitons are for.  Upon inspection all bill bottles with hand written indicaiton.  Has  all bottles with him and tops in place.  Multiple empty bottle of which he reports his pharmacy is supposed to be bringing refills for today  Diet Compliance: probably noncompliant though I cannot elicit that specific history ------------------------------------------------------------------------------------------------------------------ New Concerns:  none   ROS:  PER HPI  Pertinent History  Reviewed:  Medical & Surgical Hx:  Reviewed: Significant for dermatomyositis, multiple acute hospitalizations for acute psychosis with evidence of potential harm to self or others Medications: Reviewed & Updated - See associated section in EMR Social History: Reviewed -  reports that he has been smoking Cigarettes.  He has been smoking about 0.50 packs per day. He has never used smokeless tobacco.   Objective Findings:  Vitals: BP 136/86  Pulse 75  Temp(Src) 99.9 F (37.7 C) (Oral)  Ht 5\' 8"  (1.727 m)  Wt 191 lb (86.637 kg)  BMI 29.05 kg/m2  PE: GENERAL:  Adult well kempt  male. Ambultes in with assistance of walker.  In no discomfort; no respiratory distress. PSYCH: Alert and interactive; Insight:Shallow.  Peserverates on God healing him and that he is significantly better due to [employer] intervention.  Denies SI/HI, auditory or visual hallucinations.  Reports thinking clearer than normal but still with tangential thought content on exam.    Reports doing well at home and able to care for self better than ever.  No concerns raised other than paying for bills.  Still very disorganized thoughts and pressured speech H&N: AT/[place], trachea midline EENT:  MMM, no scleral icterus, EOMi HEART: RRR, S1/S2 heard, no murmur LUNGS: CTA B, no wheezes, no crackles EXTREMITIES: Moves all 4 extremities spontaneously, warm well perfused, no edema, bilateral DP and PT pulses 2/4.      Assessment & Plan:

## 2012-04-30 NOTE — Assessment & Plan Note (Addendum)
Appears acutely manic potentially. Reports not taking the psych meds provided to him however all pill bottles empty and at appropriate time for refill.   >> Consider therapeutic levels of appropriate medications helping with depression/anxiety and chronic pain vs acute mania.  Will need to continue to be monitored for potential risk of self harm.  APS referral in place from last hospitalization. >>Needs PSYCH followup and informed him that it is no longer appropriate that [facility] provide his psychiatric care.  He acknowledges this and acknowledges that if he wants to continue his current medications he needs to be evaluated as an OP at the [facility] facility

## 2012-04-30 NOTE — Assessment & Plan Note (Signed)
Okay to continue Methotrexate. Rx sent to pharmacy as unclear if remaining refills and pt was out today

## 2012-04-30 NOTE — Assessment & Plan Note (Signed)
Reports doing okay at home Declines further intervention needed at home Has food, no concerns with his medications although extensive amount of >14min visit spent in medication reconciliation

## 2012-04-30 NOTE — Assessment & Plan Note (Signed)
Will need A1c Rechecked in 1 month Currently diet controlled

## 2012-04-30 NOTE — Assessment & Plan Note (Signed)
Stable/Well Controlled - no changes at this time. 

## 2012-05-02 ENCOUNTER — Telehealth: Payer: Self-pay | Admitting: Sports Medicine

## 2012-05-02 ENCOUNTER — Telehealth: Payer: Self-pay | Admitting: Internal Medicine

## 2012-05-02 NOTE — Telephone Encounter (Signed)
Patient needs to speak to [name] about a psychiatrist that takes appointments and is affordable.  He has to be able to give [facility] an estimated pick up time or he won't have a way home.

## 2012-05-02 NOTE — Telephone Encounter (Signed)
LVM for patient to call back. I can either mail him or he can come pick up a list of psycharists that I have of names and insurances that they accept. He can then call their offices and decide from there which one he would prefer to go to. This is due to the fact that psychiatrists do not take referrals and the patient has to make his own appointment.

## 2012-05-02 NOTE — Telephone Encounter (Signed)
Pt returned call to wrong office.

## 2012-05-02 NOTE — Telephone Encounter (Signed)
New Problem:    Patient called in retuning a call.  Please call back.

## 2012-05-03 NOTE — Telephone Encounter (Signed)
Spoke with patient and informed him that I will fax him out a list that I have. He was fine with this and agreed to this. Also informed him that it may take a couple of days for this to come through mail.

## 2012-05-08 ENCOUNTER — Telehealth: Payer: Self-pay | Admitting: Sports Medicine

## 2012-05-08 ENCOUNTER — Other Ambulatory Visit: Payer: Self-pay | Admitting: Sports Medicine

## 2012-05-08 NOTE — Telephone Encounter (Signed)
Patient is calling because the fluid pills were denied by [name], but he really needs it.

## 2012-05-10 MED ORDER — TORSEMIDE 10 MG PO TABS: 10.0000 mg | ORAL_TABLET | Freq: Every day | ORAL | Status: DC

## 2012-05-10 NOTE — Telephone Encounter (Signed)
Spoke with patient and informed him of below. I also sent out another list for him

## 2012-05-10 NOTE — Telephone Encounter (Signed)
Have refilled but pt needs to be seen again. Please schedule appointment in 1-2 weeks.  Andrena Mews, DO

## 2012-05-10 NOTE — Telephone Encounter (Signed)
Patient is calling because he still hasn't gotten the list of Psychiatrists and he would like another one mailed to him.

## 2012-05-12 ENCOUNTER — Telehealth: Payer: Self-pay | Admitting: Sports Medicine

## 2012-05-12 NOTE — Telephone Encounter (Signed)
Received request for diabetic supplies.  Not on insulin therapy so denied.  Not appropriate to be checking sugars at home.  Also received Rx for DM shoes.  Completed & given to preceptors to sign.  Andrena Mews, DO [facility] Resident - PGY-2 05/12/2012 1:41 PM

## 2012-05-13 ENCOUNTER — Encounter (HOSPITAL_COMMUNITY): Payer: Self-pay | Admitting: Emergency Medicine

## 2012-05-13 ENCOUNTER — Telehealth: Payer: Self-pay | Admitting: Family Medicine

## 2012-05-13 ENCOUNTER — Emergency Department (HOSPITAL_COMMUNITY)
Admission: EM | Admit: 2012-05-13 | Discharge: 2012-05-13 | Disposition: A | Discharge status: Home / Self Care | Payer: Medicare HMO | Attending: Emergency Medicine | Admitting: Emergency Medicine

## 2012-05-13 DIAGNOSIS — I4891 Unspecified atrial fibrillation: Secondary | ICD-10-CM | POA: Insufficient documentation

## 2012-05-13 DIAGNOSIS — J4489 Other specified chronic obstructive pulmonary disease: Secondary | ICD-10-CM | POA: Insufficient documentation

## 2012-05-13 DIAGNOSIS — F17200 Nicotine dependence, unspecified, uncomplicated: Secondary | ICD-10-CM | POA: Insufficient documentation

## 2012-05-13 DIAGNOSIS — Z860100 Personal history of colon polyps, unspecified: Secondary | ICD-10-CM | POA: Insufficient documentation

## 2012-05-13 DIAGNOSIS — Z87442 Personal history of urinary calculi: Secondary | ICD-10-CM | POA: Insufficient documentation

## 2012-05-13 DIAGNOSIS — Z7982 Long term (current) use of aspirin: Secondary | ICD-10-CM | POA: Insufficient documentation

## 2012-05-13 DIAGNOSIS — J449 Chronic obstructive pulmonary disease, unspecified: Secondary | ICD-10-CM | POA: Insufficient documentation

## 2012-05-13 DIAGNOSIS — I252 Old myocardial infarction: Secondary | ICD-10-CM | POA: Insufficient documentation

## 2012-05-13 DIAGNOSIS — F329 Major depressive disorder, single episode, unspecified: Secondary | ICD-10-CM | POA: Insufficient documentation

## 2012-05-13 DIAGNOSIS — Z79899 Other long term (current) drug therapy: Secondary | ICD-10-CM | POA: Insufficient documentation

## 2012-05-13 DIAGNOSIS — F411 Generalized anxiety disorder: Secondary | ICD-10-CM | POA: Insufficient documentation

## 2012-05-13 DIAGNOSIS — Z8669 Personal history of other diseases of the nervous system and sense organs: Secondary | ICD-10-CM | POA: Insufficient documentation

## 2012-05-13 DIAGNOSIS — Z0389 Encounter for observation for other suspected diseases and conditions ruled out: Secondary | ICD-10-CM | POA: Insufficient documentation

## 2012-05-13 DIAGNOSIS — K219 Gastro-esophageal reflux disease without esophagitis: Secondary | ICD-10-CM | POA: Insufficient documentation

## 2012-05-13 DIAGNOSIS — I10 Essential (primary) hypertension: Secondary | ICD-10-CM | POA: Insufficient documentation

## 2012-05-13 DIAGNOSIS — E669 Obesity, unspecified: Secondary | ICD-10-CM | POA: Insufficient documentation

## 2012-05-13 DIAGNOSIS — Z209 Contact with and (suspected) exposure to unspecified communicable disease: Secondary | ICD-10-CM

## 2012-05-13 DIAGNOSIS — Z8719 Personal history of other diseases of the digestive system: Secondary | ICD-10-CM | POA: Insufficient documentation

## 2012-05-13 DIAGNOSIS — Z8739 Personal history of other diseases of the musculoskeletal system and connective tissue: Secondary | ICD-10-CM | POA: Insufficient documentation

## 2012-05-13 DIAGNOSIS — R209 Unspecified disturbances of skin sensation: Secondary | ICD-10-CM | POA: Insufficient documentation

## 2012-05-13 DIAGNOSIS — IMO0001 Reserved for inherently not codable concepts without codable children: Secondary | ICD-10-CM | POA: Insufficient documentation

## 2012-05-13 DIAGNOSIS — Z8659 Personal history of other mental and behavioral disorders: Secondary | ICD-10-CM | POA: Insufficient documentation

## 2012-05-13 DIAGNOSIS — Z203 Contact with and (suspected) exposure to rabies: Secondary | ICD-10-CM | POA: Insufficient documentation

## 2012-05-13 DIAGNOSIS — Z872 Personal history of diseases of the skin and subcutaneous tissue: Secondary | ICD-10-CM | POA: Insufficient documentation

## 2012-05-13 DIAGNOSIS — N3941 Urge incontinence: Secondary | ICD-10-CM | POA: Insufficient documentation

## 2012-05-13 DIAGNOSIS — E119 Type 2 diabetes mellitus without complications: Secondary | ICD-10-CM | POA: Insufficient documentation

## 2012-05-13 DIAGNOSIS — Z862 Personal history of diseases of the blood and blood-forming organs and certain disorders involving the immune mechanism: Secondary | ICD-10-CM | POA: Insufficient documentation

## 2012-05-13 DIAGNOSIS — E785 Hyperlipidemia, unspecified: Secondary | ICD-10-CM | POA: Insufficient documentation

## 2012-05-13 DIAGNOSIS — Z8679 Personal history of other diseases of the circulatory system: Secondary | ICD-10-CM | POA: Insufficient documentation

## 2012-05-13 MED ORDER — RABIES VACCINE, PCEC IM SUSR
1.0000 mL | Freq: Once | INTRAMUSCULAR | Status: AC
  Administered 2012-05-13: 1 mL via INTRAMUSCULAR
  Filled 2012-05-13: qty 1

## 2012-05-13 NOTE — Telephone Encounter (Signed)
[facility] after hours line:  Bit by [place] in 2004 and was treated for rabies at that time. Thinks he was allergic to IVIG.  Says the substituted something but he isnt sure what.  Yesterday evening, Ran bats out of somewhere in his house.  Thinks he was bit on the hand and appears to be bat bite.  Very sore, numbness and pain in back and shoulder.  Right hand swollen up. No fever/chills/nausea/vomiting  Patient running low on cell phone minutes and had to get off the line. Advised patient to go to ED for further evaluation and likely treatment for rabies.

## 2012-05-13 NOTE — ED Provider Notes (Signed)
This case was discussed in depth, with the provider.  This patient apparently had a primary series Rabies vaccine 9 years ago. Therefore, due to possible rabies exposure, now; he only needs rabies vaccine booster series. Not a full primary series.  Medical screening examination/treatment/procedure(s) were performed by non-physician practitioner and as supervising physician I was immediately available for consultation/collaboration.  Flint Melter, MD 05/13/12 [phone]

## 2012-05-13 NOTE — ED Notes (Signed)
Per EMS, pt states he was bitten by bat 2 days ago-was bitten by same bat this am, while sleeping

## 2012-05-13 NOTE — ED Notes (Signed)
Pt reports possible exposure to bats over the last few days.. Two small scratches noted on r/forearm. C/o numbness in r/arm and face over last few hours
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
## 2012-05-13 NOTE — ED Notes (Signed)
ZOX:[id number]<AV> Expected date:05/13/12<BR> Expected time:12:49 PM<BR> Means of arrival:Ambulance<BR> Comments:<BR> Bat bite/ arm swellling

## 2012-05-15 ENCOUNTER — Telehealth: Payer: Self-pay | Admitting: *Deleted

## 2012-05-15 NOTE — Telephone Encounter (Signed)
Ms. Jenelle Mages, RN, Case Manager with [facility] left message on Physician Line.  States [employer] faxed over a request for Diabetic Shoes and Diabetic Testing Supplies but Mr. Stelmach told them that [name] would not complete these forms because he thought it was a scam.  Ms. Jenelle Mages states that [facility] is a pharmacy that carries diabetic supplies used by [facility] and it is not a scam.  She has reached out to [facility] to ask them to refax [place] the forms but they state they can only send out one fax a month so Ms. Jenelle Mages is asking for [name] to reach out to [facility] at [phone] or [facility] at [phone].  Will forward to [name] for review.  Ileana Ladd

## 2012-05-15 NOTE — Telephone Encounter (Signed)
I completed the request for diabetic shoes and placed it in Dr. Tyson Alias box for signature.    However, I did decline diabetic testing supplies.  According to the Choosing Wisely Campaign published from the Society of General Internal Medicine 2013, in Type 2 Diabetic patients who are not on insulin therapy, "self monitoring of glucose" should not be performed.    I have not spoken to Patrick Holt since our last office visit so I am not sure how he was informed that I thought this was a scam.   I stand by my prior decisions to provide him with diabetic shoes but to defer home glucose monitoring.   If Patrick Holt calls back please inform her of the above.  I will attempt to reach out to [facility] in the coming days

## 2012-05-16 ENCOUNTER — Telehealth: Payer: Self-pay | Admitting: Sports Medicine

## 2012-05-16 ENCOUNTER — Telehealth (HOSPITAL_COMMUNITY): Payer: Self-pay | Admitting: Emergency Medicine

## 2012-05-16 NOTE — Telephone Encounter (Signed)
Dr. Gwendolyn Grant is not [employer] registered yet.  You will need to have another faculty sign the forms.  Ileana Ladd
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
## 2012-05-16 NOTE — ED Notes (Signed)
Pt reports that he called the urgent care and they refused to make an appt for him to come in for f/u rabies shots. Pt reports that he rides scat and they wont do "will calls"  Advised pt to come to [facility]cc for f/u shots and then call scat once shot has been given.

## 2012-05-17 NOTE — Telephone Encounter (Signed)
Called both numbers. One is not connected.  The other is to [facility].  No contact for case Chemical engineer.  I would be happy to discuss this with her when I available or if she will leave direct contact information. Diabetic shoe rx completed by Dr. Mauricio Po today and placed in to be faxed. Once again home glucose testing not indicated

## 2012-05-18 NOTE — Telephone Encounter (Signed)
Diabetic Shoe forms faxed to [facility].  Ileana Ladd

## 2012-05-19 ENCOUNTER — Emergency Department (INDEPENDENT_AMBULATORY_CARE_PROVIDER_SITE_OTHER)
Admission: EM | Admit: 2012-05-19 | Discharge: 2012-05-19 | Disposition: A | Discharge status: Home / Self Care | Payer: Medicare HMO | Source: Home / Self Care | Attending: Emergency Medicine | Admitting: Emergency Medicine

## 2012-05-19 ENCOUNTER — Encounter (HOSPITAL_COMMUNITY): Payer: Self-pay | Admitting: Emergency Medicine

## 2012-05-19 DIAGNOSIS — Z203 Contact with and (suspected) exposure to rabies: Secondary | ICD-10-CM

## 2012-05-19 DIAGNOSIS — Z23 Encounter for immunization: Secondary | ICD-10-CM

## 2012-05-19 MED ORDER — RABIES VACCINE, PCEC IM SUSR
INTRAMUSCULAR | Status: AC
  Filled 2012-05-19: qty 1

## 2012-05-19 MED ORDER — LIDOCAINE-EPINEPHRINE-TETRACAINE (LET) SOLUTION
NASAL | Status: AC
  Filled 2012-05-19: qty 3

## 2012-05-19 MED ORDER — RABIES VACCINE, PCEC IM SUSR
1.0000 mL | Freq: Once | INTRAMUSCULAR | Status: AC
  Administered 2012-05-19: 1 mL via INTRAMUSCULAR

## 2012-05-19 NOTE — Telephone Encounter (Signed)
Pt appears to be in Urgent Care currently.  Compliance has been an ongoing issue.  He has an appointment next week and I will follow up with him at that time.

## 2012-05-19 NOTE — ED Provider Notes (Signed)
History     CSN: [id number]  Arrival date & time 05/19/12  4098   First MD Initiated Contact with Patient 05/19/12 1015      Chief Complaint  Patient presents with  . Allergic Reaction    (Consider location/radiation/quality/duration/timing/severity/associated sxs/prior treatment) HPI Comments: Patient presents urgent care, to receive a second posterior for a recent post exposure scenario and which he might have gotten bit by a bat that was never trapped. The patient, had received a post exposure rabies vaccination about 8 years ago from a previous exposure to raccoon.  Patient, presents " in a hurry", 1 and to receive his last shot. Patient denies any symptoms such as headache, muscle rigidity or paresthesias no abdominal pain.   Patient is a 62 y.o. male presenting with allergic reaction. The history is provided by the patient.  Allergic Reaction The primary symptoms do not include nausea, dizziness or rash.    Past Medical History  Diagnosis Date  . Rheumatoid arthritis   . Obesity   . Major depression     with acute psychotic break in 06/2010  . Diabetes mellitus   . Hypertension   . Hyperlipidemia   . Diverticulosis of colon   . COPD (chronic obstructive pulmonary disease)   . Anxiety   . GERD (gastroesophageal reflux disease)   . Vertigo   . Seizures   . Fibromyalgia   . Dermatomyositis   . Myocardial infarct     mulitple (1999, 2000, 2004)  . Raynaud's disease   . Narcotic dependence   . Peripheral neuropathy   . Internal hemorrhoids   . Ischemic heart disease   . Hiatal hernia   . Gastritis   . Diverticulitis   . Hx of adenomatous colonic polyps   . Nephrolithiasis   . Anemia   . Esophageal stricture   . Esophageal dysmotility   . Dermatomyositis   . Paroxysmal a-fib   . Urge incontinence   . Otosclerosis   . Bipolar 1 disorder   . OCD (obsessive compulsive disorder)     Past Surgical History  Procedure Laterality Date  . Knee arthroscopy w/  meniscal repair  2009    left  . Lumbar disc surgery    . Squamous papilloma   2010    removed by Dr. Pollyann Kennedy ENT, noted on tongue    Family History  Problem Relation Age of Onset  . Alcohol abuse Mother   . Depression Mother   . Heart disease Mother   . Diabetes Mother   . Stroke Mother   . Diabetes Other     1/2 brother    History  Substance Use Topics  . Smoking status: Current Every Day Smoker -- 0.50 packs/day    Types: Cigarettes  . Smokeless tobacco: Never Used     Comment: PATIENT STATES HE QUIT SMOKING LAST WINTER   . Alcohol Use: 0.6 oz/week    1 Glasses of wine per week     Comment: daily      Review of Systems  Constitutional: Negative for fever and activity change.  Gastrointestinal: Negative for nausea.  Musculoskeletal: Negative for myalgias, back pain and arthralgias.  Skin: Negative for color change, pallor and rash.  Neurological: Negative for dizziness, facial asymmetry, weakness, numbness and headaches.    Allergies  Betamethasone dipropionate; Bupropion hcl; Ciprofloxacin; Clobetasol; Codeine; Escitalopram oxalate; Fluoxetine hcl; Fluticasone-salmeterol; Furosemide; Immune globulins; Metronidazole; Paroxetine; Penicillins; Statins; Tacrolimus; Tetanus toxoid; and Tuberculin purified protein derivative  Home Medications   Current  Outpatient Rx  Name  Route  Sig  Dispense  Refill  . amiodarone (PACERONE) 200 MG tablet   Oral   Take 200 mg by mouth daily before breakfast.         . aspirin EC 81 MG tablet   Oral   Take 81 mg by mouth daily.         . collagenase (SANTYL) ointment   Topical   Apply 1 application topically daily.         . divalproex (DEPAKOTE) 500 MG DR tablet   Oral   Take 1 tablet (500 mg total) by mouth every 12 (twelve) hours.   60 tablet   0     Needs for Psychiatrist to fill in future   . esomeprazole (NEXIUM) 40 MG capsule   Oral   Take 40 mg by mouth daily before breakfast.         . glipiZIDE  (GLUCOTROL XL) 5 MG 24 hr tablet   Oral   Take 5 mg by mouth daily.         . Ipratropium-Albuterol (COMBIVENT) 20-100 MCG/ACT AERS respimat   Inhalation   Inhale 1 puff into the lungs every 6 (six) hours.         . magnesium oxide (MAG-OX) 400 MG tablet   Oral   Take 400 mg by mouth daily.         . mirabegron ER (MYRBETRIQ) 25 MG TB24   Oral   Take 1 tablet (25 mg total) by mouth daily.   30 tablet   1   . niacin (NIASPAN) 1000 MG CR tablet   Oral   Take 1,000 mg by mouth at bedtime.         . nitroGLYCERIN (NITROSTAT) 0.4 MG SL tablet   Sublingual   Place 0.4 mg under the tongue every 5 (five) minutes as needed. For chest pain         . QUEtiapine (SEROQUEL) 200 MG tablet   Oral   Take 1 tablet (200 mg total) by mouth at bedtime.   30 tablet   0   . rOPINIRole (REQUIP) 2 MG tablet   Oral   Take 2 mg by mouth 3 (three) times daily.         Marland Kitchen torsemide (DEMADEX) 10 MG tablet   Oral   Take 10 mg by mouth daily before breakfast.           BP 152/85  Pulse 79  Temp(Src) 98.9 F (37.2 C) (Oral)  SpO2 98%  Physical Exam  Nursing note and vitals reviewed. Constitutional: He is oriented to person, place, and time. Vital signs are normal. He appears well-developed and well-nourished.  Non-toxic appearance. He does not have a sickly appearance. He does not appear ill. No distress.  Neurological: He is alert and oriented to person, place, and time. He exhibits normal muscle tone.    ED Course  Procedures (including critical care time)  Labs Reviewed - No data to display No results found.   1. Need for post exposure prophylaxis for rabies       MDM  Patient completed his post exposure prophylaxis. With second Rabvert. Patient experienced no immediate local or systemic reaction.        Jimmie Molly, MD 05/19/12 1110

## 2012-05-19 NOTE — ED Notes (Signed)
Pt c/o possible allergic reaction to rabies vaccination. Given on 05/14/11. Pt reports itching around injection site on left deltoid. Also feels pain in injection site that radiates to neck. Pt is alert and oriented.

## 2012-05-25 ENCOUNTER — Other Ambulatory Visit: Payer: Self-pay | Admitting: *Deleted

## 2012-05-26 ENCOUNTER — Telehealth: Payer: Self-pay | Admitting: Sports Medicine

## 2012-05-26 ENCOUNTER — Encounter: Payer: Self-pay | Admitting: Sports Medicine

## 2012-05-26 ENCOUNTER — Ambulatory Visit (INDEPENDENT_AMBULATORY_CARE_PROVIDER_SITE_OTHER): Payer: Medicare HMO | Admitting: Sports Medicine

## 2012-05-26 VITALS — BP 152/83 | HR 83 | Temp 98.9°F | Ht 68.0 in | Wt 215.0 lb

## 2012-05-26 DIAGNOSIS — F29 Unspecified psychosis not due to a substance or known physiological condition: Secondary | ICD-10-CM

## 2012-05-26 DIAGNOSIS — F411 Generalized anxiety disorder: Secondary | ICD-10-CM

## 2012-05-26 DIAGNOSIS — F1027 Alcohol dependence with alcohol-induced persisting dementia: Secondary | ICD-10-CM

## 2012-05-26 DIAGNOSIS — K030 Excessive attrition of teeth: Secondary | ICD-10-CM

## 2012-05-26 DIAGNOSIS — M3313 Other dermatomyositis without myopathy: Secondary | ICD-10-CM

## 2012-05-26 DIAGNOSIS — N3941 Urge incontinence: Secondary | ICD-10-CM

## 2012-05-26 DIAGNOSIS — I259 Chronic ischemic heart disease, unspecified: Secondary | ICD-10-CM

## 2012-05-26 DIAGNOSIS — E119 Type 2 diabetes mellitus without complications: Secondary | ICD-10-CM

## 2012-05-26 DIAGNOSIS — R52 Pain, unspecified: Secondary | ICD-10-CM

## 2012-05-26 DIAGNOSIS — I10 Essential (primary) hypertension: Secondary | ICD-10-CM

## 2012-05-26 DIAGNOSIS — M3390 Dermatopolymyositis, unspecified, organ involvement unspecified: Secondary | ICD-10-CM

## 2012-05-26 MED ORDER — OXYBUTYNIN CHLORIDE 5 MG PO TABS: 5.0000 mg | ORAL_TABLET | Freq: Three times a day (TID) | ORAL | Status: DC

## 2012-05-26 MED ORDER — QUETIAPINE FUMARATE 200 MG PO TABS: 200.0000 mg | ORAL_TABLET | Freq: Every day | ORAL | Status: DC

## 2012-05-26 NOTE — Assessment & Plan Note (Addendum)
Should consider stopping glipizide given A1c that was returned following the patient leaving the visit. No reported falls but concern for hypoglycemia with lack of meals.   Addressed indications for checking blood sugars at home.  Given not on insulin therapy not truly indicated.  Not appropriate to titrate his own glipizide. > I do recommend that he be taken off of this medicine at this time and will have my nursing staff contact him on Monday morning.

## 2012-05-26 NOTE — Assessment & Plan Note (Signed)
Patient has not had ability to set up psychiatric followup on his own.  Encouraged him to continue calling.  Will need to be followed up next week to ensure he is scheduled appointment.  Unclear if he has the capacity to make this appointment or not.

## 2012-05-26 NOTE — Telephone Encounter (Signed)
Patient seen in clinic today please see associated note

## 2012-05-26 NOTE — Assessment & Plan Note (Addendum)
Continues to be followed by cardiology.  Denies any current symptoms of ischemic chest pain .  Will defer further to them. continue aspirin

## 2012-05-26 NOTE — Assessment & Plan Note (Addendum)
Not tolerate Mybetriq Patient was restarted Ditropan that he had and is doing well with this.  Represcribed and informed him that his insurance will likely not cover this.

## 2012-05-26 NOTE — Assessment & Plan Note (Signed)
Not accepted by pain management since last referral.  Not currently require pain medications so will defer this time with difficulty going forward.

## 2012-05-26 NOTE — Progress Notes (Signed)
  [facility]  Patient name: Patrick Holt MRN [id number]  Date of birth: 1950/02/28  CC & HPI:  Patrick Holt is a 62 y.o. male presenting today for follow up of:  #  Hypertension - chronic problem, adequately controlled.    Home BP checks/ranges: not checking  no orthostasis  no chest pain, no dyspnea on exertion, no orthopnea/PND, no peripheral edema,   no episodes of unilateral weakness, dysarthria or acute visual changes  #  Diabetes - chronic problem, very well controlled.   Blood Sugar checks/ranges: not checking   no hypoglycemic symptoms/episodes.   no poluria, no polydipsia,  no new visual problems,   LE dysesthesias: Yes - underlying MSK issues  self foot checks performed: Weekly  # [place] Bite and Rabies Prophylaxis: Has now completed  # Psychiatric Issues:  Has not follow up with psychiatrist due to difficulty scheduling appointment.  He wants a male on [address] to be a Therapist, sports but does not know who.  Has lack of insight regarding need for psychiatrist given previous psychosis  # Chronic Pain: Reports overall doing well not requiring chronic pain medication.  Is no longer requiring the use of a walker.  Does have difficulty with leaning over and picking things up off of the floor.  # Urinary incontinence: Reports he had some left over Detrol and restarted taking that after quitting Mybetriq as this significantly worsened his urinary incontinence. ------------------------------------------------------------------------------------------------------------------ Medication Compliance: probably noncompliant though I cannot elicit that specific history  Diet Compliance: Reports eating 6 meals per day and has gained 25 pounds.  Does have access to meals on wheels.  ROS:  PER HPI  Pertinent History Reviewed:  Medical & Surgical Hx:  Reviewed: Significant for recent multiple hospitalizations for acute psychotic episodes.  Most recently  raccoon bite.  Otherwise per problem list Medications: Reviewed & Updated - See associated section in EMR Social History: Reviewed -  reports that he has been smoking Cigarettes.  He has been smoking about 0.50 packs per day. He has never used smokeless tobacco.   Objective Findings:  Vitals: BP 152/83  Pulse 83  Temp(Src) 98.9 F (37.2 C) (Oral)  Ht 5\' 8"  (1.727 m)  Wt 215 lb (97.523 kg)  BMI 32.7 kg/m2  PE: GENERAL:  Patient walks into clinic today unassisted.  Adult male male.  In no discomfort; no respiratory distress. PSYCH: Alert and appropriately interactive; Insight:Shallow, stream of thought again on exam today.  Disorganized thoughts.  No evidence of hallucination, no SI HI.  H&N: AT/[place], trachea midline, no JVD, hepatojugular reflux EENT:  MMM, no scleral icterus, EOMi HEART: RRR, S1/S2 heard, no murmur LUNGS: CTA B, no wheezes, no crackles EXTREMITIES: Moves all 4 extremities spontaneously, warm well perfused, bilateral lower extremity 1+ edema    Assessment & Plan:

## 2012-05-26 NOTE — Patient Instructions (Addendum)
It was good to see you.  Please follow up in 1 month  I am referring you to psychiatry.  You have to follow up with them as soon as possible to be evaluated.  It is okay to stop checking your sugars

## 2012-05-26 NOTE — Assessment & Plan Note (Signed)
Needs to see a dentist however no current resources available.

## 2012-05-26 NOTE — Assessment & Plan Note (Signed)
Given recommendations for blood pressure target range and elderly patients I do feel as though is appropriate to have a higher threshold for his systolic blood pressure in the setting his goal 160/90.  Given his multiple other medical conditions I am hesitant to add additional agents that could potentially cause hypotension and resultant fall

## 2012-05-26 NOTE — Assessment & Plan Note (Signed)
Has not been accepted by [facility] pain management physicians.  Does not requiring chronic pain medicines at this time.  On methotrexate.    > recheck labs in 1 month.

## 2012-05-26 NOTE — Telephone Encounter (Signed)
The nurse is calling to let [name] know that the patient has not been able to make an appt with a Psychiatrist and she thought that if the doctor could do a referral that would help to speed things up.  He also has some bad teeth in his mouth so if [name] could help out with some place that offers free dental care.  The patient doesn't seem to understand that he doesn't need to check his blood sugars daily so if [name] could explain that to him.  And, the patient doesn't understand that he doesn't need Hospice and there are no agencies out there that he qualifies for and because of his psychiatric problems, he doesn't understand.  Patient doesn't know if he qualifies for food stamps, but he does get Meals on Wheels once a day, 5 days a week.

## 2012-05-29 ENCOUNTER — Telehealth: Payer: Self-pay | Admitting: *Deleted

## 2012-05-29 NOTE — Telephone Encounter (Signed)
Message copied by Farrell Ours on Mon May 29, 2012 11:15 AM ------      Message from: Patrick Holt      Created: Fri May 26, 2012  7:59 PM       Please call patient had him stop his glipizide. ------

## 2012-05-29 NOTE — Telephone Encounter (Signed)
Spoke with patient and he said that he has ordered the glipize and that he does not want to stop it. i informed him that his A1C was 4.8. He also needs Nexium called in to the pharmacy.

## 2012-06-08 ENCOUNTER — Other Ambulatory Visit: Payer: Self-pay | Admitting: *Deleted

## 2012-06-08 MED ORDER — NIACIN ER (ANTIHYPERLIPIDEMIC) 1000 MG PO TBCR: 1000.0000 mg | EXTENDED_RELEASE_TABLET | Freq: Every day | ORAL | Status: DC

## 2012-06-19 ENCOUNTER — Ambulatory Visit (INDEPENDENT_AMBULATORY_CARE_PROVIDER_SITE_OTHER): Payer: Medicare HMO | Admitting: Internal Medicine

## 2012-06-19 DIAGNOSIS — Z79899 Other long term (current) drug therapy: Secondary | ICD-10-CM

## 2012-06-19 DIAGNOSIS — I4891 Unspecified atrial fibrillation: Secondary | ICD-10-CM

## 2012-06-19 LAB — PULMONARY FUNCTION TEST

## 2012-06-19 NOTE — Progress Notes (Signed)
PFT done today. 

## 2012-06-21 ENCOUNTER — Telehealth: Payer: Self-pay | Admitting: Sports Medicine

## 2012-06-21 NOTE — Telephone Encounter (Signed)
Pt is calling to say that he cannot ever get an appt with a psychiatrist - needs for [place] to call and make appt for him in June

## 2012-06-22 NOTE — Telephone Encounter (Signed)
Will send out referral list again patient is to make his appointment himself

## 2012-06-26 ENCOUNTER — Ambulatory Visit (INDEPENDENT_AMBULATORY_CARE_PROVIDER_SITE_OTHER): Payer: Medicare HMO | Admitting: Internal Medicine

## 2012-06-26 ENCOUNTER — Encounter: Payer: Self-pay | Admitting: Internal Medicine

## 2012-06-26 VITALS — BP 148/91 | HR 77 | Ht 66.5 in | Wt 230.8 lb

## 2012-06-26 DIAGNOSIS — I4891 Unspecified atrial fibrillation: Secondary | ICD-10-CM

## 2012-06-26 DIAGNOSIS — I10 Essential (primary) hypertension: Secondary | ICD-10-CM

## 2012-06-26 LAB — SEDIMENTATION RATE: Sed Rate: 8 mm/hr (ref 0–22)

## 2012-06-26 LAB — COMPREHENSIVE METABOLIC PANEL
ALT: 9 U/L (ref 0–53)
CO2: 30 mEq/L (ref 19–32)
Creatinine, Ser: 1 mg/dL (ref 0.4–1.5)
GFR: 82.99 mL/min (ref >60.00)
Total Bilirubin: 0.3 mg/dL (ref 0.3–1.2)

## 2012-06-26 LAB — TSH: TSH: 0.41 u[IU]/mL (ref 0.35–5.50)

## 2012-06-26 NOTE — Patient Instructions (Addendum)
LABS TODAY:  TSH, CKMB, CMET, SED RATE

## 2012-06-26 NOTE — Progress Notes (Signed)
HPI Patient is a 62 yo with a histroy of reported CAD (no records), dermatomyositis, HTN, DM, Afib, COPD, HTN, subduralor subarachnoid hematoma, RA and schizoaffective disorder. The patient was hsop in 12/13 for fall (? Due to seizure)  In rapid afib.   . Echo 12/13: Mild LVH, vigorous LV, EF 65-70%, grade 1 diastolic dysfunction, mild LAE, mild aortic stenosis, mean gradient 10.  The patient was seen by Wende Mott in Feb.   Since seen his breathing is OK  He denies palpitations.  Denies CP Hs thoughts are wandering, difficutl to keep focused  WIll answer directed questions.    Allergies  Allergen Reactions  . Betamethasone Dipropionate     Unknown  . Bupropion Hcl     Unknown  . Ciprofloxacin     REACTION: swelling  . Clobetasol     Unknown  . Codeine     Unknown  . Escitalopram Oxalate     Unknown  . Fluoxetine Hcl     Unknown  . Fluticasone-Salmeterol     Unknown  . Furosemide     Unknown  . Immune Globulins     Acute renal failure  . Metronidazole     REACTION: swelling  . Paroxetine     Unknown  . Penicillins     Unknown  . Statins     Unknown  . Tacrolimus     Unknown  . Tetanus Toxoid     Unknown  . Tuberculin Purified Protein Derivative     Unknown    Current Outpatient Prescriptions  Medication Sig Dispense Refill  . ALPRAZolam (XANAX) 1 MG tablet Take 1 mg by mouth 3 (three) times daily as needed for sleep.      Marland Kitchen amiodarone (PACERONE) 200 MG tablet Take 200 mg by mouth daily before breakfast.      . aspirin EC 81 MG tablet Take 81 mg by mouth daily.      . collagenase (SANTYL) ointment Apply 1 application topically daily.      . diclofenac (VOLTAREN) 75 MG EC tablet       . divalproex (DEPAKOTE) 500 MG DR tablet Take 1 tablet (500 mg total) by mouth every 12 (twelve) hours.  60 tablet  0  . esomeprazole (NEXIUM) 40 MG capsule Take 40 mg by mouth daily before breakfast.      . Ipratropium-Albuterol (COMBIVENT) 20-100 MCG/ACT AERS respimat Inhale 1 puff  into the lungs every 6 (six) hours.      . magnesium oxide (MAG-OX) 400 MG tablet Take 400 mg by mouth daily.      . methotrexate (RHEUMATREX) 2.5 MG tablet       . niacin (NIASPAN) 1000 MG CR tablet Take 1 tablet (1,000 mg total) by mouth at bedtime.  30 tablet  11  . nitroGLYCERIN (NITROSTAT) 0.4 MG SL tablet Place 0.4 mg under the tongue every 5 (five) minutes as needed. For chest pain      . oxybutynin (DITROPAN) 5 MG tablet Take 1 tablet (5 mg total) by mouth 3 (three) times daily.  90 tablet  0  . QUEtiapine (SEROQUEL) 200 MG tablet Take 1 tablet (200 mg total) by mouth at bedtime.  30 tablet  0  . rOPINIRole (REQUIP) 2 MG tablet Take 2 mg by mouth 3 (three) times daily.      Marland Kitchen torsemide (DEMADEX) 10 MG tablet Take 10 mg by mouth daily before breakfast.      . [DISCONTINUED] oxybutynin (DITROPAN) 5 MG tablet Take 1  tablet (5 mg total) by mouth 3 (three) times daily.  90 tablet  3   No current facility-administered medications for this visit.    Past Medical History  Diagnosis Date  . Rheumatoid arthritis   . Obesity   . Major depression     with acute psychotic break in 06/2010  . Diabetes mellitus   . Hypertension   . Hyperlipidemia   . Diverticulosis of colon   . COPD (chronic obstructive pulmonary disease)   . Anxiety   . GERD (gastroesophageal reflux disease)   . Vertigo   . Seizures   . Fibromyalgia   . Dermatomyositis   . Myocardial infarct     mulitple (1999, 2000, 2004)  . Raynaud's disease   . Narcotic dependence   . Peripheral neuropathy   . Internal hemorrhoids   . Ischemic heart disease   . Hiatal hernia   . Gastritis   . Diverticulitis   . Hx of adenomatous colonic polyps   . Nephrolithiasis   . Anemia   . Esophageal stricture   . Esophageal dysmotility   . Dermatomyositis   . Paroxysmal a-fib   . Urge incontinence   . Otosclerosis   . Bipolar 1 disorder   . OCD (obsessive compulsive disorder)     Past Surgical History  Procedure Laterality  Date  . Knee arthroscopy w/ meniscal repair  2009    left  . Lumbar disc surgery    . Squamous papilloma   2010    removed by Dr. Pollyann Kennedy ENT, noted on tongue    Family History  Problem Relation Age of Onset  . Alcohol abuse Mother   . Depression Mother   . Heart disease Mother   . Diabetes Mother   . Stroke Mother   . Diabetes Other     1/2 brother    History   Social History  . Marital Status: Divorced    Spouse Name: N/A    Number of Children: N/A  . Years of Education: N/A   Occupational History  . disabled    Social History Main Topics  . Smoking status: Current Every Day Smoker -- 0.50 packs/day    Types: Cigarettes  . Smokeless tobacco: Never Used     Comment: PATIENT STATES HE QUIT SMOKING LAST WINTER   . Alcohol Use: 0.6 oz/week    1 Glasses of wine per week     Comment: daily  . Drug Use: No  . Sexually Active: No   Other Topics Concern  . Not on file   Social History Narrative  . No narrative on file    Review of Systems:  All systems reviewed.  They are negative to the above problem except as previously stated.  Vital Signs: BP 148/91  Pulse 77  Ht 5' 6.5" (1.689 m)  Wt 230 lb 12.8 oz (104.69 kg)  BMI 36.7 kg/m2  Physical Exam Patient is in NAD HEENT:  Normocephalic, atraumatic. EOMI, PERRLA.  Neck: JVP is normal.  No bruits.  Lungs: clear to auscultation. No rales no wheezes.  Heart: Regular rate and rhythm. Normal S1, S2. No S3.   No significant murmurs. PMI not displaced.  Abdomen:  Supple, nontender. Normal bowel sounds. No masses. No hepatomegaly.  Extremities:   Good distal pulses throughout. 1 to 2+ lower extremity edema.  Musculoskeletal :moving all extremities.  Neuro:   alert and oriented x3.  CN II-XII grossly intact. Skin:  Erythema throughout EKG  SR 72.  IWMI  Anteroseptal MI  Assessment and Plan:  1.  Atrial fibrillatoin.  Remains in SR  Keep on 200 mg amiodarone.  He is not an anticoagulation candidate with bleeding hx.   Will check labs with amio  2.  HTN  Follow  A little high today  I would not make changes  3.  CAD  By report.  No record of cath in past.  No actvie symptoms  I would follow.  3.  Dermatomyositis.  Will check ESR

## 2012-06-29 ENCOUNTER — Ambulatory Visit (INDEPENDENT_AMBULATORY_CARE_PROVIDER_SITE_OTHER): Payer: Medicare HMO | Admitting: Sports Medicine

## 2012-06-29 ENCOUNTER — Encounter: Payer: Self-pay | Admitting: Sports Medicine

## 2012-06-29 VITALS — BP 152/82 | HR 80 | Temp 98.2°F | Ht 66.0 in | Wt 230.1 lb

## 2012-06-29 DIAGNOSIS — F411 Generalized anxiety disorder: Secondary | ICD-10-CM

## 2012-06-29 DIAGNOSIS — L608 Other nail disorders: Secondary | ICD-10-CM

## 2012-06-29 DIAGNOSIS — E119 Type 2 diabetes mellitus without complications: Secondary | ICD-10-CM

## 2012-06-29 DIAGNOSIS — F329 Major depressive disorder, single episode, unspecified: Secondary | ICD-10-CM

## 2012-06-29 DIAGNOSIS — N3941 Urge incontinence: Secondary | ICD-10-CM

## 2012-06-29 DIAGNOSIS — M3390 Dermatopolymyositis, unspecified, organ involvement unspecified: Secondary | ICD-10-CM

## 2012-06-29 DIAGNOSIS — I10 Essential (primary) hypertension: Secondary | ICD-10-CM

## 2012-06-29 DIAGNOSIS — L603 Nail dystrophy: Secondary | ICD-10-CM

## 2012-06-29 MED ORDER — ESOMEPRAZOLE MAGNESIUM 40 MG PO CPDR: 40.0000 mg | DELAYED_RELEASE_CAPSULE | Freq: Every day | ORAL | Status: DC

## 2012-06-29 MED ORDER — COLLAGENASE 250 UNIT/GM EX OINT: 1.0000 "application " | TOPICAL_OINTMENT | Freq: Every day | CUTANEOUS | Status: DC

## 2012-06-29 MED ORDER — NIACIN ER (ANTIHYPERLIPIDEMIC) 1000 MG PO TBCR: 1000.0000 mg | EXTENDED_RELEASE_TABLET | Freq: Every day | ORAL | Status: DC

## 2012-06-29 MED ORDER — TORSEMIDE 20 MG PO TABS: 20.0000 mg | ORAL_TABLET | Freq: Every day | ORAL | Status: DC

## 2012-06-29 MED ORDER — OXYBUTYNIN CHLORIDE 5 MG PO TABS: 5.0000 mg | ORAL_TABLET | Freq: Three times a day (TID) | ORAL | Status: DC

## 2012-06-29 NOTE — Patient Instructions (Addendum)
It was nice to see you today.   Today we discussed: Psych Referal - please call with the psychiatrist from the list provided.  It is very important to followup with them.  Fluid Status:  Please increase your Demadex to 20 mg each morning.  This will help get off some of the extra fluid. I have also given a prescription for compression socks.  Please go to the nearest medical supply stores to see if these will be covered with your insurance.  Your BP is okay. It is okay to stop your methotrexate if you are feeling this good Please stop taking your GLIPIZIDE     Please plan to return to see me in 1 month.  If you need anything prior to seeing me please call the clinic.  Please Bring all medications with you to each appointment.

## 2012-06-29 NOTE — Progress Notes (Signed)
[facility]  Patient name: Patrick Holt MRN [id number]  Date of birth: 1950/06/23  CC & HPI:  Patrick Holt is a 62 y.o. male presenting today for follow up of:  #  Hypertension - chronic problem, well controlled.    Home BP checks/ranges: not checking  no orthostasis  no chest pain, no dyspnea on exertion, mild orthopnea/PND, mild worsening peripheral edema,   no episodes of unilateral weakness, dysarthria or acute visual changes   #  Diabetes - chronic problem, very well controlled.   Blood Sugar checks/ranges: Not checking  no hypoglycemic symptoms/episodes.   no poluria, no polydipsia,  no new visual problems,  LE dysesthesias: Mild - unchanged associated more with Dermatomyositis  self foot checks performed: Rarely  no chest pain, no dyspnea on exertion, no orthopnea/PND, no peripheral edema,   no episodes of unilateral weakness, dysarthria or acute visual changes  Newly reports having difficulty trimming his toenails.   # Dermatomyositis: Reports doing relatively well.  Not require his chronic pain medication  # Fall: small abrasion on left forehead.  No LOC. Legs gave out.  Not using walker.  Home health has been ordered but issue with pain home health services due to patient compliance in the past.  In concerns for home health aide in the presence of Mr. Milnes due to his past behaviors.  # anxiety and psychiatric issues: Patient has been taking Xanax that filled hesitant lady in the past.  He reports only taking them occasionally.  He has not been taking any of his other antipsychotics for greater than one month.  He feels like he is doing well.  He does understand that he needs to see psychiatry for further evaluation due to his past episodes of psychosis.  He denies any episodes of auditory or visual hallucinations  ------------------------------------------------------------------------------------------------------------------ Medication  Compliance: compliant most of the time still very poor insight as to what medications he is on for what.  Does have moments of clarity however when asking how he takes his medicines he often times becomes easily confused.   Diet Compliance: noncompliant much of the time ------------------------------------------------------------------------------------------------------------------ New Concerns:  none   ROS:  PER HPI  Pertinent History Reviewed:  Medical & Surgical Hx:  Reviewed: Significant for multiple recent hospitalizations for psychiatric issues.  Has not followed up with psychiatry Medications: Reviewed & Updated - See associated section in EMR Social History: Reviewed -  reports that he has been smoking Cigarettes.  He has been smoking about 0.50 packs per day. He has never used smokeless tobacco.   Objective Findings:  Vitals: Pulse 80  Temp(Src) 98.2 F (36.8 C) (Oral)  Ht 5\' 6"  (1.676 m)  Wt 230 lb 1.6 oz (104.373 kg)  BMI 37.16 kg/m2  PE: GENERAL:  Somewhat disheveled adult chronically ill a well-developed male. In no discomfort; no respiratory distress. PSYCH: Alert and appropriately interactive; Insight:Fair has occasional moments of clarity but does have tangential thoughts on a regular basis.  His memory is actually exquisite and he recalls many details of our last meeting.  This is a significant proven from prior episodes were he has difficulty remembering things that were discussed   H&N: AT/[place], trachea midline EENT:  MMM, no scleral icterus, EOMi HEART: RRR, S1/S2 heard, no murmur LUNGS: CTA B, no wheezes, no crackles EXTREMITIES: Moves all 4 extremities spontaneously, warm well perfused, no edema, bilateral DP and PT pulses 2/4.  Bilateral dystrophic nails without evidence of diabetic ulcerations or lesions.  Assessment & Plan:

## 2012-07-03 DIAGNOSIS — L603 Nail dystrophy: Secondary | ICD-10-CM | POA: Insufficient documentation

## 2012-07-03 NOTE — Assessment & Plan Note (Signed)
See above

## 2012-07-03 NOTE — Assessment & Plan Note (Addendum)
He is currently not under the care of psychiatrist and on no psychiatric medications due to his problems being beyond the scope of primary care. Once again reiterated the importance of psychiatric followup.  Provided community resources again.   Of note:  Patient did leave his Xanax bottle here in clinic after discussing whether the a not this medicine was appropriate for him.  I did inform him I would not be continuing to feel this medicine and encouraged him to see a psychiatrist to further further discuss his ongoing psychiatric care.  Patient did elect to stop this medication and that was the reason he left it.  He has been on a low dose of this (reports only taking one every couple of days) and I agree that is safe to stop with minimal concerns for seizure activity.  Be partially filled Xanax bottle was left with Dr. Raymondo Band who appropriately secured this medicine as witnessed by myself and Huntley Dec CMA.   We did contact Mr. Latner and he will plan to pick up his prescription bottle at his earliest convenience.  I do not disagree with him having this medicine but do feel is more appropriate that he have oversight from a psychiatrist for this.  We explained to him that we were unable to mail this medication.

## 2012-07-03 NOTE — Assessment & Plan Note (Signed)
Bilateral dystrophic nails in the setting of well controlled diabetes.  All 10 lower treated nails trimmed today. > Will plan to evaluate and trimmed as needed

## 2012-07-03 NOTE — Assessment & Plan Note (Signed)
Slight volume overload.  Will increase his diuretic

## 2012-07-03 NOTE — Assessment & Plan Note (Signed)
Patient currently diet controlled. With monotherapy glipizide his A1c dropped to 4.8. History of multiple recurrent falls and at risk for hypoglycemia due to missing meals on a regular basis due to availability and. Accessibility  >Will continue to monitor his A1c in 3 months and reassess at that time

## 2012-07-03 NOTE — Assessment & Plan Note (Signed)
Refilled Oxybutyin.  Mybetriq did not seem to help him

## 2012-07-03 NOTE — Assessment & Plan Note (Signed)
Currently off of all medications and reports he is feeling better than ever.   Will continue to hesitantly monitor.

## 2012-07-05 ENCOUNTER — Emergency Department (HOSPITAL_COMMUNITY)
Admission: EM | Admit: 2012-07-05 | Discharge: 2012-07-05 | Disposition: A | Discharge status: Home / Self Care | Payer: Medicare HMO | Attending: Emergency Medicine | Admitting: Emergency Medicine

## 2012-07-05 ENCOUNTER — Encounter (HOSPITAL_COMMUNITY): Payer: Self-pay | Admitting: *Deleted

## 2012-07-05 ENCOUNTER — Emergency Department (HOSPITAL_COMMUNITY): Payer: Medicare HMO

## 2012-07-05 ENCOUNTER — Ambulatory Visit (HOSPITAL_COMMUNITY): Admission: RE | Admit: 2012-07-05 | Payer: Medicare HMO | Source: Ambulatory Visit

## 2012-07-05 DIAGNOSIS — F17200 Nicotine dependence, unspecified, uncomplicated: Secondary | ICD-10-CM | POA: Insufficient documentation

## 2012-07-05 DIAGNOSIS — Z87442 Personal history of urinary calculi: Secondary | ICD-10-CM | POA: Insufficient documentation

## 2012-07-05 DIAGNOSIS — G40909 Epilepsy, unspecified, not intractable, without status epilepticus: Secondary | ICD-10-CM | POA: Insufficient documentation

## 2012-07-05 DIAGNOSIS — IMO0001 Reserved for inherently not codable concepts without codable children: Secondary | ICD-10-CM | POA: Insufficient documentation

## 2012-07-05 DIAGNOSIS — Z8669 Personal history of other diseases of the nervous system and sense organs: Secondary | ICD-10-CM | POA: Insufficient documentation

## 2012-07-05 DIAGNOSIS — Z880 Allergy status to penicillin: Secondary | ICD-10-CM | POA: Insufficient documentation

## 2012-07-05 DIAGNOSIS — E119 Type 2 diabetes mellitus without complications: Secondary | ICD-10-CM | POA: Insufficient documentation

## 2012-07-05 DIAGNOSIS — F419 Anxiety disorder, unspecified: Secondary | ICD-10-CM

## 2012-07-05 DIAGNOSIS — Z8639 Personal history of other endocrine, nutritional and metabolic disease: Secondary | ICD-10-CM | POA: Insufficient documentation

## 2012-07-05 DIAGNOSIS — F411 Generalized anxiety disorder: Secondary | ICD-10-CM | POA: Insufficient documentation

## 2012-07-05 DIAGNOSIS — Z7982 Long term (current) use of aspirin: Secondary | ICD-10-CM | POA: Insufficient documentation

## 2012-07-05 DIAGNOSIS — Z8679 Personal history of other diseases of the circulatory system: Secondary | ICD-10-CM | POA: Insufficient documentation

## 2012-07-05 DIAGNOSIS — H81399 Other peripheral vertigo, unspecified ear: Secondary | ICD-10-CM | POA: Insufficient documentation

## 2012-07-05 DIAGNOSIS — I10 Essential (primary) hypertension: Secondary | ICD-10-CM | POA: Insufficient documentation

## 2012-07-05 DIAGNOSIS — F429 Obsessive-compulsive disorder, unspecified: Secondary | ICD-10-CM | POA: Insufficient documentation

## 2012-07-05 DIAGNOSIS — Z79899 Other long term (current) drug therapy: Secondary | ICD-10-CM | POA: Insufficient documentation

## 2012-07-05 DIAGNOSIS — E669 Obesity, unspecified: Secondary | ICD-10-CM | POA: Insufficient documentation

## 2012-07-05 DIAGNOSIS — Z8719 Personal history of other diseases of the digestive system: Secondary | ICD-10-CM | POA: Insufficient documentation

## 2012-07-05 DIAGNOSIS — F319 Bipolar disorder, unspecified: Secondary | ICD-10-CM | POA: Insufficient documentation

## 2012-07-05 DIAGNOSIS — K219 Gastro-esophageal reflux disease without esophagitis: Secondary | ICD-10-CM | POA: Insufficient documentation

## 2012-07-05 DIAGNOSIS — M069 Rheumatoid arthritis, unspecified: Secondary | ICD-10-CM | POA: Insufficient documentation

## 2012-07-05 DIAGNOSIS — Z860100 Personal history of colon polyps, unspecified: Secondary | ICD-10-CM | POA: Insufficient documentation

## 2012-07-05 DIAGNOSIS — Z8739 Personal history of other diseases of the musculoskeletal system and connective tissue: Secondary | ICD-10-CM | POA: Insufficient documentation

## 2012-07-05 DIAGNOSIS — G4700 Insomnia, unspecified: Secondary | ICD-10-CM | POA: Insufficient documentation

## 2012-07-05 DIAGNOSIS — J449 Chronic obstructive pulmonary disease, unspecified: Secondary | ICD-10-CM | POA: Insufficient documentation

## 2012-07-05 DIAGNOSIS — I252 Old myocardial infarction: Secondary | ICD-10-CM | POA: Insufficient documentation

## 2012-07-05 DIAGNOSIS — Z862 Personal history of diseases of the blood and blood-forming organs and certain disorders involving the immune mechanism: Secondary | ICD-10-CM | POA: Insufficient documentation

## 2012-07-05 DIAGNOSIS — J4489 Other specified chronic obstructive pulmonary disease: Secondary | ICD-10-CM | POA: Insufficient documentation

## 2012-07-05 MED ORDER — ALPRAZOLAM 0.5 MG PO TABS: 0.5000 mg | ORAL_TABLET | Freq: Two times a day (BID) | ORAL | Status: DC

## 2012-07-05 MED ORDER — MECLIZINE HCL 25 MG PO TABS: 25.0000 mg | ORAL_TABLET | Freq: Four times a day (QID) | ORAL | Status: DC

## 2012-07-05 NOTE — ED Notes (Signed)
Per EMS, pt from home with reports of increased anxiety and insomnia. EMS reports that pt has hx of same. EMS reports that pt is non compliant with psych meds.

## 2012-07-05 NOTE — ED Notes (Addendum)
PT DID NOT HAVE A LS Spine xray today

## 2012-07-05 NOTE — ED Provider Notes (Signed)
History     CSN: [id number]  Arrival date & time 07/05/12  1114   First MD Initiated Contact with Patient 07/05/12 1136      Chief Complaint  Patient presents with  . Anxiety  . Insomnia    (Consider location/radiation/quality/duration/timing/severity/associated sxs/prior treatment) HPI Comments: 62 y.o. Male with PMHx of anxiety, vertigo, DM, HTN, and a series of behavioral issues, presents today with complaints of increased anxiety and episodic vertigo. Pt states he was to see his PCP ([name]) recently and his Xanax was left out of his medication bag. Pt also reports episodes of the room spinning when he lays down too quickly. He feels he is out of sorts because he has not slept in a few days d/t his increasing anxiety and feelings of vertigo. Pt denies fever, nausea, vomiting, chest pain, shortness of breath, loss of bowel/bladder control, ataxia, numbness or dyspnea.    Past Medical History  Diagnosis Date  . Rheumatoid arthritis   . Obesity   . Major depression     with acute psychotic break in 06/2010  . Diabetes mellitus   . Hypertension   . Hyperlipidemia   . Diverticulosis of colon   . COPD (chronic obstructive pulmonary disease)   . Anxiety   . GERD (gastroesophageal reflux disease)   . Vertigo   . Seizures   . Fibromyalgia   . Dermatomyositis   . Myocardial infarct     mulitple (1999, 2000, 2004)  . Raynaud's disease   . Narcotic dependence   . Peripheral neuropathy   . Internal hemorrhoids   . Ischemic heart disease   . Hiatal hernia   . Gastritis   . Diverticulitis   . Hx of adenomatous colonic polyps   . Nephrolithiasis   . Anemia   . Esophageal stricture   . Esophageal dysmotility   . Dermatomyositis   . Paroxysmal a-fib   . Urge incontinence   . Otosclerosis   . Bipolar 1 disorder   . OCD (obsessive compulsive disorder)     Past Surgical History  Procedure Laterality Date  . Knee arthroscopy w/ meniscal repair  2009    left  . Lumbar  disc surgery    . Squamous papilloma   2010    removed by Dr. Pollyann Kennedy ENT, noted on tongue    Family History  Problem Relation Age of Onset  . Alcohol abuse Mother   . Depression Mother   . Heart disease Mother   . Diabetes Mother   . Stroke Mother   . Diabetes Other     1/2 brother    History  Substance Use Topics  . Smoking status: Current Every Day Smoker -- 0.50 packs/day    Types: Cigarettes  . Smokeless tobacco: Never Used     Comment: PATIENT STATES HE QUIT SMOKING LAST WINTER   . Alcohol Use: 0.6 oz/week    1 Glasses of wine per week     Comment: daily      Review of Systems  Constitutional: Negative for fever and diaphoresis.  HENT: Negative for neck pain and neck stiffness.   Eyes: Negative for visual disturbance.  Respiratory: Negative for apnea, chest tightness and shortness of breath.   Cardiovascular: Negative for chest pain and palpitations.  Gastrointestinal: Negative for nausea, vomiting, diarrhea and constipation.  Genitourinary: Negative for dysuria.  Musculoskeletal: Negative for gait problem.  Skin: Negative for rash.  Neurological: Positive for dizziness. Negative for syncope, weakness, light-headedness, numbness and headaches.  Feels dizzy when he lays too quickly at night, feels as if the "room is spinning"    Allergies  Betamethasone dipropionate; Bupropion hcl; Ciprofloxacin; Clobetasol; Codeine; Escitalopram oxalate; Fluoxetine hcl; Fluticasone-salmeterol; Furosemide; Immune globulins; Metronidazole; Paroxetine; Penicillins; Statins; Tacrolimus; Tetanus toxoid; and Tuberculin purified protein derivative  Home Medications   Current Outpatient Rx  Name  Route  Sig  Dispense  Refill  . amiodarone (PACERONE) 200 MG tablet   Oral   Take 200 mg by mouth daily before breakfast.         . collagenase (SANTYL) ointment   Topical   Apply 1 application topically daily.   15 g   2   . ALPRAZolam (XANAX) 0.5 MG tablet   Oral   Take 1  tablet (0.5 mg total) by mouth 2 (two) times daily.   4 tablet   0   . aspirin EC 81 MG tablet   Oral   Take 81 mg by mouth daily.         . diclofenac (VOLTAREN) 75 MG EC tablet               . divalproex (DEPAKOTE) 500 MG DR tablet   Oral   Take 1 tablet (500 mg total) by mouth every 12 (twelve) hours.   60 tablet   0     Needs for Psychiatrist to fill in future   . esomeprazole (NEXIUM) 40 MG capsule   Oral   Take 1 capsule (40 mg total) by mouth daily before breakfast.   30 capsule   11   . Ipratropium-Albuterol (COMBIVENT) 20-100 MCG/ACT AERS respimat   Inhalation   Inhale 1 puff into the lungs every 6 (six) hours.         . magnesium oxide (MAG-OX) 400 MG tablet   Oral   Take 400 mg by mouth daily.         . meclizine (ANTIVERT) 25 MG tablet   Oral   Take 1 tablet (25 mg total) by mouth 4 (four) times daily.   28 tablet   0   . methotrexate (RHEUMATREX) 2.5 MG tablet               . niacin (NIASPAN) 1000 MG CR tablet   Oral   Take 1 tablet (1,000 mg total) by mouth at bedtime.   30 tablet   11   . nitroGLYCERIN (NITROSTAT) 0.4 MG SL tablet   Sublingual   Place 0.4 mg under the tongue every 5 (five) minutes as needed. For chest pain         . oxybutynin (DITROPAN) 5 MG tablet   Oral   Take 1 tablet (5 mg total) by mouth 3 (three) times daily.   90 tablet   3   . QUEtiapine (SEROQUEL) 200 MG tablet   Oral   Take 1 tablet (200 mg total) by mouth at bedtime.   30 tablet   0   . rOPINIRole (REQUIP) 2 MG tablet   Oral   Take 2 mg by mouth 3 (three) times daily.         Marland Kitchen torsemide (DEMADEX) 20 MG tablet   Oral   Take 1 tablet (20 mg total) by mouth daily.   30 tablet   2     BP 132/96  Pulse 60  Temp(Src) 98.8 F (37.1 C) (Oral)  Resp 18  Wt 230 lb (104.327 kg)  BMI 37.14 kg/m2  SpO2 98%  Physical Exam  Nursing note  and vitals reviewed. Constitutional: He is oriented to person, place, and time. He appears  well-developed and well-nourished. No distress.  HENT:  Head: Normocephalic and atraumatic.  Eyes: Conjunctivae and EOM are normal.  Neck: Normal range of motion. Neck supple.  No meningeal signs  Cardiovascular: Normal rate, regular rhythm and normal heart sounds.  Exam reveals no gallop and no friction rub.   No murmur heard. Pulmonary/Chest: Effort normal and breath sounds normal. No respiratory distress. He has no wheezes. He has no rales. He exhibits no tenderness.  Abdominal: Soft. Bowel sounds are normal. He exhibits no distension. There is no tenderness. There is no rebound and no guarding.  Musculoskeletal: Normal range of motion. He exhibits no edema and no tenderness.     Neurological: He is alert and oriented to person, place, and time. No cranial nerve deficit.  Speech is clear and goal oriented, follows commands Sensation normal to light touch and two point discrimination Moves extremities without ataxia, coordination intact Normal strength in upper and lower extremities bilaterally including dorsiflexion and plantar flexion, strong and equal grip strength    Skin: Skin is warm and dry. He is not diaphoretic. No erythema.  Psychiatric:  Pt appears anxious, circumferential, and tangential    ED Course  Procedures (including critical care time)  Labs Reviewed - No data to display No results found. Discharge Medication List as of 07/05/2012 12:23 PM    START taking these medications   Details  ALPRAZolam (XANAX) 0.5 MG tablet Take 1 tablet (0.5 mg total) by mouth 2 (two) times daily., Starting 07/05/2012, Until Discontinued, Print    meclizine (ANTIVERT) 25 MG tablet Take 1 tablet (25 mg total) by mouth 4 (four) times daily., Starting 07/05/2012, Until Discontinued, Print         1. Anxiety   2. Vertigo, peripheral, unspecified laterality       MDM  Pt presenting today with complaints of increased anxiety and episodic vertigo and hx of same. Difficult to nail  down precisely what the pt was looking for as he tended to drift onto tangents that began with sentences like, "14 years ago," and "last December." Pt had no somatic complaints at this time. Normal neuro exam is not concerning for central vertigo. Pt seems only to want relief from his anxiety which he believes has been caused by episodic BPPV and has caused him to lose sleep. I explained to the pt that he can visit his primary care doctor for refills as he has been prescribed Xanax and Antivert in the past and let him know that I would refill his medication as a courtesy.   Pt does not drive and called [id number] to come to the ED.  Advised that this was also not the best use of resources.Pt states he understands, but did not know what else to do. I explained I am only able to prescribe enough medications to get him by until he is able to see [name]. Pt states he understands and is appreciative. Nursing assisted with finding a way for the pt to get home and to get his prescriptions filled.   Glade Nurse, PA-C 07/05/12 1612

## 2012-07-06 NOTE — ED Provider Notes (Signed)
Medical screening examination/treatment/procedure(s) were performed by non-physician practitioner and as supervising physician I was immediately available for consultation/collaboration.   Charles B. Bernette Mayers, MD 07/06/12 9604

## 2012-07-19 ENCOUNTER — Other Ambulatory Visit: Payer: Self-pay | Admitting: *Deleted

## 2012-07-20 MED ORDER — ROPINIROLE HCL 2 MG PO TABS: 2.0000 mg | ORAL_TABLET | Freq: Three times a day (TID) | ORAL | Status: DC

## 2012-07-24 ENCOUNTER — Emergency Department (HOSPITAL_COMMUNITY): Payer: Medicare HMO

## 2012-07-24 ENCOUNTER — Emergency Department (HOSPITAL_COMMUNITY)
Admission: EM | Admit: 2012-07-24 | Discharge: 2012-07-24 | Disposition: A | Discharge status: Home / Self Care | Payer: Medicare HMO | Attending: Emergency Medicine | Admitting: Emergency Medicine

## 2012-07-24 ENCOUNTER — Encounter (HOSPITAL_COMMUNITY): Payer: Self-pay | Admitting: Emergency Medicine

## 2012-07-24 DIAGNOSIS — R109 Unspecified abdominal pain: Secondary | ICD-10-CM | POA: Insufficient documentation

## 2012-07-24 DIAGNOSIS — K219 Gastro-esophageal reflux disease without esophagitis: Secondary | ICD-10-CM | POA: Insufficient documentation

## 2012-07-24 DIAGNOSIS — G609 Hereditary and idiopathic neuropathy, unspecified: Secondary | ICD-10-CM | POA: Insufficient documentation

## 2012-07-24 DIAGNOSIS — Z860100 Personal history of colon polyps, unspecified: Secondary | ICD-10-CM | POA: Insufficient documentation

## 2012-07-24 DIAGNOSIS — Z79899 Other long term (current) drug therapy: Secondary | ICD-10-CM | POA: Insufficient documentation

## 2012-07-24 DIAGNOSIS — J449 Chronic obstructive pulmonary disease, unspecified: Secondary | ICD-10-CM | POA: Insufficient documentation

## 2012-07-24 DIAGNOSIS — Z87442 Personal history of urinary calculi: Secondary | ICD-10-CM | POA: Insufficient documentation

## 2012-07-24 DIAGNOSIS — Z8659 Personal history of other mental and behavioral disorders: Secondary | ICD-10-CM | POA: Insufficient documentation

## 2012-07-24 DIAGNOSIS — Y9301 Activity, walking, marching and hiking: Secondary | ICD-10-CM | POA: Insufficient documentation

## 2012-07-24 DIAGNOSIS — Z8679 Personal history of other diseases of the circulatory system: Secondary | ICD-10-CM | POA: Insufficient documentation

## 2012-07-24 DIAGNOSIS — Z862 Personal history of diseases of the blood and blood-forming organs and certain disorders involving the immune mechanism: Secondary | ICD-10-CM | POA: Insufficient documentation

## 2012-07-24 DIAGNOSIS — F309 Manic episode, unspecified: Secondary | ICD-10-CM | POA: Insufficient documentation

## 2012-07-24 DIAGNOSIS — J4489 Other specified chronic obstructive pulmonary disease: Secondary | ICD-10-CM | POA: Insufficient documentation

## 2012-07-24 DIAGNOSIS — K5732 Diverticulitis of large intestine without perforation or abscess without bleeding: Secondary | ICD-10-CM | POA: Insufficient documentation

## 2012-07-24 DIAGNOSIS — Z87448 Personal history of other diseases of urinary system: Secondary | ICD-10-CM | POA: Insufficient documentation

## 2012-07-24 DIAGNOSIS — E119 Type 2 diabetes mellitus without complications: Secondary | ICD-10-CM | POA: Insufficient documentation

## 2012-07-24 DIAGNOSIS — G40909 Epilepsy, unspecified, not intractable, without status epilepticus: Secondary | ICD-10-CM | POA: Insufficient documentation

## 2012-07-24 DIAGNOSIS — Z8669 Personal history of other diseases of the nervous system and sense organs: Secondary | ICD-10-CM | POA: Insufficient documentation

## 2012-07-24 DIAGNOSIS — E669 Obesity, unspecified: Secondary | ICD-10-CM | POA: Insufficient documentation

## 2012-07-24 DIAGNOSIS — Z872 Personal history of diseases of the skin and subcutaneous tissue: Secondary | ICD-10-CM | POA: Insufficient documentation

## 2012-07-24 DIAGNOSIS — I10 Essential (primary) hypertension: Secondary | ICD-10-CM | POA: Insufficient documentation

## 2012-07-24 DIAGNOSIS — Z8719 Personal history of other diseases of the digestive system: Secondary | ICD-10-CM | POA: Insufficient documentation

## 2012-07-24 DIAGNOSIS — K5792 Diverticulitis of intestine, part unspecified, without perforation or abscess without bleeding: Secondary | ICD-10-CM

## 2012-07-24 DIAGNOSIS — F1920 Other psychoactive substance dependence, uncomplicated: Secondary | ICD-10-CM | POA: Insufficient documentation

## 2012-07-24 DIAGNOSIS — Z7982 Long term (current) use of aspirin: Secondary | ICD-10-CM | POA: Insufficient documentation

## 2012-07-24 DIAGNOSIS — S0100XA Unspecified open wound of scalp, initial encounter: Secondary | ICD-10-CM | POA: Insufficient documentation

## 2012-07-24 DIAGNOSIS — IMO0002 Reserved for concepts with insufficient information to code with codable children: Secondary | ICD-10-CM

## 2012-07-24 DIAGNOSIS — W1809XA Striking against other object with subsequent fall, initial encounter: Secondary | ICD-10-CM | POA: Insufficient documentation

## 2012-07-24 DIAGNOSIS — I252 Old myocardial infarction: Secondary | ICD-10-CM | POA: Insufficient documentation

## 2012-07-24 DIAGNOSIS — Z8739 Personal history of other diseases of the musculoskeletal system and connective tissue: Secondary | ICD-10-CM | POA: Insufficient documentation

## 2012-07-24 DIAGNOSIS — F17200 Nicotine dependence, unspecified, uncomplicated: Secondary | ICD-10-CM | POA: Insufficient documentation

## 2012-07-24 DIAGNOSIS — E785 Hyperlipidemia, unspecified: Secondary | ICD-10-CM | POA: Insufficient documentation

## 2012-07-24 DIAGNOSIS — Y92009 Unspecified place in unspecified non-institutional (private) residence as the place of occurrence of the external cause: Secondary | ICD-10-CM | POA: Insufficient documentation

## 2012-07-24 LAB — URINALYSIS, ROUTINE W REFLEX MICROSCOPIC
Bilirubin Urine: NEGATIVE
Ketones, ur: NEGATIVE mg/dL
Nitrite: NEGATIVE
Protein, ur: NEGATIVE mg/dL
Specific Gravity, Urine: 1.013 (ref 1.005–1.030)
Urobilinogen, UA: 0.2 mg/dL (ref 0.0–1.0)

## 2012-07-24 LAB — CBC WITH DIFFERENTIAL/PLATELET
Eosinophils Absolute: 0.2 10*3/uL (ref 0.0–0.7)
HCT: 43.6 % (ref 39.0–52.0)
Hemoglobin: 14.3 g/dL (ref 13.0–17.0)
Lymphs Abs: 2.1 10*3/uL (ref 0.7–4.0)
MCH: 30 pg (ref 26.0–34.0)
MCV: 91.6 fL (ref 78.0–100.0)
Monocytes Relative: 9 % (ref 3–12)
Neutrophils Relative %: 58 % (ref 43–77)
RBC: 4.76 MIL/uL (ref 4.22–5.81)

## 2012-07-24 LAB — COMPREHENSIVE METABOLIC PANEL
Alkaline Phosphatase: 76 U/L (ref 39–117)
BUN: 9 mg/dL (ref 6–23)
GFR calc Af Amer: >90 mL/min (ref >90)
Glucose, Bld: 108 mg/dL — ABNORMAL HIGH (ref 70–99)
Potassium: 3.7 mEq/L (ref 3.5–5.1)
Total Bilirubin: 0.5 mg/dL (ref 0.3–1.2)
Total Protein: 7.6 g/dL (ref 6.0–8.3)

## 2012-07-24 LAB — LIPASE, BLOOD: Lipase: 23 U/L (ref 11–59)

## 2012-07-24 MED ORDER — MORPHINE SULFATE 2 MG/ML IJ SOLN
2.0000 mg | Freq: Once | INTRAMUSCULAR | Status: AC
  Administered 2012-07-24: 2 mg via INTRAVENOUS
  Filled 2012-07-24: qty 1

## 2012-07-24 MED ORDER — SULFAMETHOXAZOLE-TRIMETHOPRIM 800-160 MG PO TABS: 1.0000 | ORAL_TABLET | Freq: Two times a day (BID) | ORAL | Status: DC

## 2012-07-24 MED ORDER — SODIUM CHLORIDE 0.9 % IV BOLUS (SEPSIS)
1000.0000 mL | Freq: Once | INTRAVENOUS | Status: AC
  Administered 2012-07-24: 1000 mL via INTRAVENOUS

## 2012-07-24 NOTE — ED Notes (Signed)
Pt presenting to ed with c/o falling and hitting his head on his TV pt states he tripped while trying to feed his birds. Pt also with c/o abdominal pain pt denies nausea and vomiting and diarrhea at this time

## 2012-07-24 NOTE — ED Provider Notes (Signed)
History     CSN: [id number]  Arrival date & time 07/24/12  1247   First MD Initiated Contact with Patient 07/24/12 1450      Chief Complaint  Patient presents with  . Head Laceration  . Abdominal Pain    (Consider location/radiation/quality/duration/timing/severity/associated sxs/prior treatment) The history is provided by the patient. No language interpreter was used.   Patrick Holt is a 62 y/o M with significant PMHx presenting to the ED with head laceration and abdominal pain. Patient reported that he tripped over bird seed feeding in his house and hit the corner of the television, leading to a laceration to the scalp of his head, occurred this morning. Patient denied LOC, blurred vision, headache, visual distortions, amnesia.  Patient presenting with abdominal pain that has been ongoing x 1 week, localized to the lower abdomen, described as a pain that "justs hurts" - patient unable to describe, without radiation to the back or flank. Stated that he has not been using anything for the discomfort. Stated that he has history of diverticulitis and hardening of the aorta. 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 Denied nausea, vomiting, diarrhea, melena, hematochezia, fever, chills, urinary symptoms, changes in diet.  PCP: [name]  Past Medical History  Diagnosis Date  . Rheumatoid arthritis(714.0)   . Obesity   . Major depression     with acute psychotic break in 06/2010  . Diabetes mellitus   . Hypertension   . Hyperlipidemia   . Diverticulosis of colon   . COPD (chronic obstructive pulmonary disease)   . Anxiety   . GERD (gastroesophageal reflux disease)   . Vertigo   . Seizures   . Fibromyalgia   . Dermatomyositis   . Myocardial infarct     mulitple (1999, 2000, 2004)  . Raynaud's disease   . Narcotic dependence   . Peripheral neuropathy     . Internal hemorrhoids   . Ischemic heart disease   . Hiatal hernia   . Gastritis   . Diverticulitis   . Hx of adenomatous colonic polyps   . Nephrolithiasis   . Anemia   . Esophageal stricture   . Esophageal dysmotility   . Dermatomyositis   . Paroxysmal a-fib   . Urge incontinence   . Otosclerosis   . Bipolar 1 disorder   . OCD (obsessive compulsive disorder)     Past Surgical History  Procedure Laterality Date  . Knee arthroscopy w/ meniscal repair  2009    left  . Lumbar disc surgery    . Squamous papilloma   2010    removed by Dr. Pollyann Kennedy ENT, noted on tongue    Family History  Problem Relation Age of Onset  . Alcohol abuse Mother   . Depression Mother   . Heart disease Mother   . Diabetes Mother   . Stroke Mother   . Diabetes Other     1/2 brother    History  Substance Use Topics  . Smoking status: Current Every Day Smoker -- 0.50 packs/day    Types: Cigarettes  . Smokeless tobacco: Never Used     Comment: PATIENT STATES HE QUIT SMOKING LAST WINTER   . Alcohol Use: No     Comment: daily      Review of Systems  Constitutional: Negative for fever, chills and fatigue.  HENT: Negative for sore throat, trouble swallowing, neck  pain and neck stiffness.   Eyes: Negative for pain and visual disturbance.  Respiratory: Negative for cough, chest tightness and shortness of breath.   Cardiovascular: Negative for chest pain.  Gastrointestinal: Positive for abdominal pain. Negative for nausea, vomiting, diarrhea, constipation, blood in stool and anal bleeding.  Genitourinary: Negative for dysuria, decreased urine volume and difficulty urinating.  Musculoskeletal: Negative for back pain and arthralgias.  Skin: Positive for wound (laceration to right side of scalp).  Neurological: Negative for dizziness, weakness, light-headedness, numbness and headaches.  All other systems reviewed and are negative.    Allergies  Betamethasone dipropionate; Bupropion hcl;  Ciprofloxacin; Clobetasol; Codeine; Escitalopram oxalate; Flagyl; Fluoxetine hcl; Fluticasone-salmeterol; Furosemide; Immune globulins; Paroxetine; Penicillins; Statins; Tacrolimus; Tetanus toxoid; and Tuberculin purified protein derivative  Home Medications   Current Outpatient Rx  Name  Route  Sig  Dispense  Refill  . ALPRAZolam (XANAX) 1 MG tablet   Oral   Take 1 mg by mouth 3 (three) times daily as needed for sleep or anxiety.         Marland Kitchen amiodarone (PACERONE) 200 MG tablet   Oral   Take 200 mg by mouth daily before breakfast.         . aspirin EC 81 MG tablet   Oral   Take 81 mg by mouth daily.         . collagenase (SANTYL) ointment   Topical   Apply 1 application topically daily.   15 g   2   . divalproex (DEPAKOTE) 500 MG DR tablet   Oral   Take 1 tablet (500 mg total) by mouth every 12 (twelve) hours.   60 tablet   0     Needs for Psychiatrist to fill in future   . esomeprazole (NEXIUM) 40 MG capsule   Oral   Take 1 capsule (40 mg total) by mouth daily before breakfast.   30 capsule   11   . Ipratropium-Albuterol (COMBIVENT) 20-100 MCG/ACT AERS respimat   Inhalation   Inhale 1 puff into the lungs every 6 (six) hours.         . magnesium oxide (MAG-OX) 400 MG tablet   Oral   Take 400 mg by mouth daily.         . methotrexate (RHEUMATREX) 2.5 MG tablet      2.5 mg See admin instructions. 3 times a week         . niacin (NIASPAN) 1000 MG CR tablet   Oral   Take 1 tablet (1,000 mg total) by mouth at bedtime.   30 tablet   11   . oxybutynin (DITROPAN) 5 MG tablet   Oral   Take 1 tablet (5 mg total) by mouth 3 (three) times daily.   90 tablet   3   . QUEtiapine (SEROQUEL) 200 MG tablet   Oral   Take 1 tablet (200 mg total) by mouth at bedtime.   30 tablet   0   . rOPINIRole (REQUIP) 2 MG tablet   Oral   Take 1 tablet (2 mg total) by mouth 3 (three) times daily.   360 tablet   0     Needs to be addressed by Psychiatrist for further   ...   . torsemide (DEMADEX) 20 MG tablet   Oral   Take 1 tablet (20 mg total) by mouth daily.   30 tablet   2   . diclofenac (VOLTAREN) 75 MG EC tablet               .  nitroGLYCERIN (NITROSTAT) 0.4 MG SL tablet   Sublingual   Place 0.4 mg under the tongue every 5 (five) minutes as needed. For chest pain         . sulfamethoxazole-trimethoprim (BACTRIM DS,SEPTRA DS) 800-160 MG per tablet   Oral   Take 1 tablet by mouth 2 (two) times daily. One po bid x 7 days   14 tablet   0     BP 163/96  Pulse 69  Temp(Src) 99 F (37.2 C) (Oral)  Resp 18  SpO2 100%  Physical Exam  Nursing note and vitals reviewed. Constitutional: He is oriented to person, place, and time. He appears well-developed and well-nourished. No distress.  HENT:  Head: Normocephalic.  Laceration, approximately 3- 3.5 cm linear noted to the right aspect of the scalp - bleeding controlled. Negative foreign body noted. Negative hematoma noted.   Eyes: Conjunctivae and EOM are normal. Pupils are equal, round, and reactive to light. Right eye exhibits no discharge. Left eye exhibits no discharge.  Neck: Normal range of motion. Neck supple. No tracheal deviation present.  Negative neck stiffness Negative nuchal rigidity Negative pain upon palpation to the cervical spine  Cardiovascular: Normal rate, regular rhythm and normal heart sounds.  Exam reveals no friction rub.   No murmur heard. Pulses:      Radial pulses are 2+ on the right side, and 2+ on the left side.  Pulmonary/Chest: Effort normal and breath sounds normal. No respiratory distress. He has no wheezes. He has no rales.  Abdominal: Soft. Bowel sounds are normal. He exhibits no distension. There is no hepatosplenomegaly. There is tenderness in the right lower quadrant and left lower quadrant. There is no rebound and no guarding. No hernia.    Musculoskeletal: Normal range of motion. He exhibits no edema and no tenderness.  Lymphadenopathy:    He  has no cervical adenopathy.  Neurological: He is alert and oriented to person, place, and time. No cranial nerve deficit or sensory deficit. He exhibits normal muscle tone. Coordination normal. GCS eye subscore is 4. GCS verbal subscore is 5. GCS motor subscore is 6.  Cranial nerves III-XII grossly intact Sensation intact  Skin: Skin is warm and dry. No rash noted. He is not diaphoretic. No erythema.  Psychiatric: He has a normal mood and affect. His behavior is normal. Thought content normal.    ED Course  Procedures (including critical care time)  LACERATION REPAIR Performed by: Raymon Mutton Authorized by: Raymon Mutton Consent: Verbal consent obtained. Risks and benefits: risks, benefits and alternatives were discussed Consent given by: patient Patient identity confirmed: provided demographic data Prepped and Draped in normal sterile fashion Wound explored  Laceration Location: scalp  Laceration Length: 3 - 3.5 cm  No Foreign Bodies seen or palpated  Anesthesia: local infiltration  Local anesthetic: lidocaine 2 % without epinephrine  Anesthetic total: 3 ml  Irrigation method: syringe Amount of cleaning: standard  Skin closure: approximate  Number of sutures: staples placed - 6  Technique: staples placed  Patient tolerance: Patient tolerated the procedure well with no immediate complications.  Good hemostasis. Negative foreign bodies noted.   7:00PM Dr. Theodoro Grist saw and assessed patient - patient refusing CT scan of abdomen.   7:02PM Spoke with Marchelle Folks from pharmacy who recommended Carbapenem for patient, Primaxin 500 mg q6h. Discussed with Dr. Theodoro Grist.   7:16PM Spoke with Dr. Rhea Belton from [facility] - discussed case with physician - recommended patient to be seen tomorrow as outpatient in office,  stated that office will give patient a call tomorrow morning and recommended Bactrim DS to be prescribed since patient is allergic to most  medications to treat for diverticulitis.    7:32PM Spoke with Denny Peon from Pharmacy regarding Methotrexate and Bactrim DS interaction - stated that this is okay and that patient needs to get CBC re-check.    Labs Reviewed  COMPREHENSIVE METABOLIC PANEL - Abnormal; Notable for the following:    CO2 33 (*)    Glucose, Bld 108 (*)    GFR calc non Af Amer 85 (*)    All other components within normal limits  CBC WITH DIFFERENTIAL  LIPASE, BLOOD  URINALYSIS, ROUTINE W REFLEX MICROSCOPIC   Ct Head Wo Contrast  07/24/2012   *RADIOLOGY REPORT*  Clinical Data:  Fall with head injury.  CT HEAD WITHOUT CONTRAST CT CERVICAL SPINE WITHOUT CONTRAST  Technique:  Multidetector CT imaging of the head and cervical spine was performed following the standard protocol without intravenous contrast.  Multiplanar CT image reconstructions of the cervical spine were also generated.  Comparison:  CT of the head on 02/29/2012  CT HEAD  Findings: Stable moderately advanced small vessel disease in the periventricular white matter and associated mild atrophy. The brain demonstrates no evidence of hemorrhage, infarction, edema, mass effect, extra-axial fluid collection, hydrocephalus or mass lesion. The skull is unremarkable.  IMPRESSION: No acute findings.  Stable small vessel disease and atrophy.  CT CERVICAL SPINE  Findings: The cervical spine shows normal alignment and no evidence of fracture or subluxation.  Degenerative changes are seen at C1-2 and C5-6.  No soft tissue swelling or hematoma is identified.  The visualized airway is normally patent.  IMPRESSION: No evidence of acute cervical spine injury.   Original Report Authenticated By: Irish Lack, M.D.   Ct Cervical Spine Wo Contrast  07/24/2012   *RADIOLOGY REPORT*  Clinical Data:  Fall with head injury.  CT HEAD WITHOUT CONTRAST CT CERVICAL SPINE WITHOUT CONTRAST  Technique:  Multidetector CT imaging of the head and cervical spine was performed following the standard  protocol without intravenous contrast.  Multiplanar CT image reconstructions of the cervical spine were also generated.  Comparison:  CT of the head on 02/29/2012  CT HEAD  Findings: Stable moderately advanced small vessel disease in the periventricular white matter and associated mild atrophy. The brain demonstrates no evidence of hemorrhage, infarction, edema, mass effect, extra-axial fluid collection, hydrocephalus or mass lesion. The skull is unremarkable.  IMPRESSION: No acute findings.  Stable small vessel disease and atrophy.  CT CERVICAL SPINE  Findings: The cervical spine shows normal alignment and no evidence of fracture or subluxation.  Degenerative changes are seen at C1-2 and C5-6.  No soft tissue swelling or hematoma is identified.  The visualized airway is normally patent.  IMPRESSION: No evidence of acute cervical spine injury.   Original Report Authenticated By: Irish Lack, M.D.   Dg Abd Acute W/chest  07/24/2012   *RADIOLOGY REPORT*  Clinical Data: Abdominal pain and nausea.  ACUTE ABDOMEN SERIES (ABDOMEN 2 VIEW & CHEST 1 VIEW)  Comparison: Chest x-ray on 03/01/2012  Findings: The lungs are clear.  There is elevation of the right hemidiaphragm with associated atelectasis at the right lung base. Mild atelectasis also present on the left.  No edema identified. Heart size is normal.  Abdominal films show no evidence of bowel obstruction or ileus.  No free air is identified.  Degenerative changes are present in the lower lumbar spine.  No abnormal calcifications  are seen.  IMPRESSION: No active disease.  Atelectasis at both lung bases.   Original Report Authenticated By: Irish Lack, M.D.     1. Laceration   2. Diverticulitis       MDM  Labs and imaging ordered.  Laceration closed via staples placed - 6 staples. Good hemostasis and negative foreign body noted. Urine negative. CBC and CMP negative findings. Lipase negative findings. Abdomen xray CT head and cervical spine negative  acute injuries noted.  07/15/2009 - CT abdomen and pelvis performed - aortic atherosclerosis noted, negative aneurysm found - negative radiation of pain to the back - doubt AAA.  Patient refused CT of abdomen for further evaluation. Patient stated that he cannot take TDaP because he is "extremely allergic" to tetanus toxoid. Suspicion is diverticulitis beginnings since patient has history. Spoke with GI who recommended patient to be treated with Bactrim and to follow-up as outpatient with [place] tomorrow. Patient stable, afebrile. Patient seen and assessed by Dr. Theodoro Grist - cleared patient for discharge. Patient discharged. Discharged patient with Bactrim - discussed with patient course. Discussed with patient to follow-up with gastroenterology tomorrow - [place] office will call him - patient understood. Referred patient to PCP. Discussed with patient that he needs to get CBC re-checked by the end of the week to follow-up with PCP by the end of the week. Discussed with patient wound care and staple care - needs to return to ED to get staples removed within 8-10 days - patient understood. Discussed with patient diet. Discussed with patient to monitor symptoms and if symptoms are to worsen or change to report back to the ED - strict return instructions given. Patient agreed to plan of care, understood, all questions answered.      Raymon Mutton, PA-C 07/24/12 2116  Raymon Mutton, PA-C 07/24/12 2119  Raymon Mutton, PA-C 07/24/12 2345

## 2012-07-24 NOTE — ED Notes (Signed)
Patient reports that he tripped over a 50 lb bag of bird seed , fell and hit the right side of his head on television at home. patient states he is suppose to be using a wheelchair due to his MS. Patient denies feeling dizzy or having a syncopal episode. Patient also c/o abdominal pain that he rates 10/10. Patient states he has a historyofo diveerticulitis. Patient denies N/V/D/

## 2012-07-25 ENCOUNTER — Ambulatory Visit: Payer: Self-pay | Admitting: Internal Medicine

## 2012-07-25 ENCOUNTER — Telehealth: Payer: Self-pay | Admitting: *Deleted

## 2012-07-25 NOTE — Telephone Encounter (Signed)
Per Dr. Rhea Belton, patients OV with Dr Juanda Chance or extender for f/u after ED visit last night. Abd pain and diverticulitis. Called patient and offered OV with Dr. Juanda Chance today. He states he cannot come today d/t transportation issues. Scheduled with Willette Cluster, NP tomorrow at 9:30 AM.

## 2012-07-26 ENCOUNTER — Encounter: Payer: Self-pay | Admitting: Nurse Practitioner

## 2012-07-26 ENCOUNTER — Ambulatory Visit (INDEPENDENT_AMBULATORY_CARE_PROVIDER_SITE_OTHER): Payer: Medicare HMO | Admitting: Nurse Practitioner

## 2012-07-26 VITALS — BP 152/78 | HR 63 | Ht 66.0 in | Wt 217.0 lb

## 2012-07-26 DIAGNOSIS — K5732 Diverticulitis of large intestine without perforation or abscess without bleeding: Secondary | ICD-10-CM

## 2012-07-26 MED ORDER — SULFAMETHOXAZOLE-TRIMETHOPRIM 800-160 MG PO TABS: 1.0000 | ORAL_TABLET | Freq: Two times a day (BID) | ORAL | Status: DC

## 2012-07-26 NOTE — Patient Instructions (Addendum)
We made you an appointment with Dr. Juanda Chance on 09-05-2012  Tuesday at 3:15 PM.   We sent and called [facility] with the Bactrim prescription today. They will deliver it today 07-26-2012.

## 2012-07-26 NOTE — ED Provider Notes (Signed)
Medical screening examination/treatment/procedure(s) were conducted as a shared visit with non-physician practitioner(s) and myself.  I personally evaluated the patient during the encounter  Derwood Kaplan, MD 07/26/12 1629

## 2012-07-26 NOTE — Progress Notes (Signed)
  History of Present Illness:  Patient is a 62 year old male known to Dr. Juanda Chance. He has a history of multiple medical problems on multiple medicines. He has multiple allergies. 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 He is s/p multiple esophageal dilations. He also has a history of adenomatous colon polyps and is due for surveillance colonoscopy in 2016.   Patient has multiple medical complaints today, talking incessantly about all of his medical conditions.  Patient recently fell at home, lacerated his head. He went to ED two days ago for evaluation of injury. He complained of lower abdominal pain in ED as well. CBC, CMET, Lipase all unremarkable. Abdominal series negative. His LLQ pain started about two weeks ago. BMs are normal. He has had this pain before, feels like recurrent diverticulitis ( in 2005 a CTscan did show possible mild diverticulitis). ED gave hiim Rx for Bactrim but he hasn't had transportation to get the medication.  No urinary symptoms   Current Medications, Allergies, Past Medical History, Past Surgical History, Family History and Social History were reviewed in [employer] record.  Physical Exam: General: Well developed , white male in no acute distress Head: Normocephalic and atraumatic Eyes:  sclerae anicteric, conjunctiva pink  Ears: Normal auditory acuity Lungs: Clear throughout to auscultation Heart: Regular rate and rhythm Abdomen: Soft, non distended, moderate LLQ tenderness.. No masses, no hepatomegaly. Normal bowel sounds Musculoskeletal: Symmetrical with no gross deformities  Neurological: Alert oriented x 4, grossly nonfocalt  Assessment and Recommendations: 84. 62 year old male with multiple medical problems. He takes multiple medications and has multiple allergies.   2. LLQ pain, suspect diverticulitis. ED gave him 7 days of Bactrim, I changed this to 10 day supply which he will start today. Follow  up with Dr. Juanda Chance in a few weeks. Patient will call in interim if symptoms worsen.

## 2012-07-27 ENCOUNTER — Encounter: Payer: Self-pay | Admitting: Nurse Practitioner

## 2012-07-28 NOTE — Progress Notes (Signed)
Reviewed and agree.Why Bactrim? db

## 2012-08-01 ENCOUNTER — Emergency Department (HOSPITAL_COMMUNITY)
Admission: EM | Admit: 2012-08-01 | Discharge: 2012-08-01 | Disposition: A | Discharge status: Home / Self Care | Payer: Medicare HMO | Attending: Emergency Medicine | Admitting: Emergency Medicine

## 2012-08-01 ENCOUNTER — Encounter (HOSPITAL_COMMUNITY): Payer: Self-pay | Admitting: *Deleted

## 2012-08-01 DIAGNOSIS — M3313 Other dermatomyositis without myopathy: Secondary | ICD-10-CM | POA: Insufficient documentation

## 2012-08-01 DIAGNOSIS — Z8719 Personal history of other diseases of the digestive system: Secondary | ICD-10-CM | POA: Insufficient documentation

## 2012-08-01 DIAGNOSIS — E669 Obesity, unspecified: Secondary | ICD-10-CM | POA: Insufficient documentation

## 2012-08-01 DIAGNOSIS — Z87442 Personal history of urinary calculi: Secondary | ICD-10-CM | POA: Insufficient documentation

## 2012-08-01 DIAGNOSIS — J449 Chronic obstructive pulmonary disease, unspecified: Secondary | ICD-10-CM | POA: Insufficient documentation

## 2012-08-01 DIAGNOSIS — Z862 Personal history of diseases of the blood and blood-forming organs and certain disorders involving the immune mechanism: Secondary | ICD-10-CM | POA: Insufficient documentation

## 2012-08-01 DIAGNOSIS — Z87448 Personal history of other diseases of urinary system: Secondary | ICD-10-CM | POA: Insufficient documentation

## 2012-08-01 DIAGNOSIS — Z4802 Encounter for removal of sutures: Secondary | ICD-10-CM | POA: Insufficient documentation

## 2012-08-01 DIAGNOSIS — F17200 Nicotine dependence, unspecified, uncomplicated: Secondary | ICD-10-CM | POA: Insufficient documentation

## 2012-08-01 DIAGNOSIS — G40909 Epilepsy, unspecified, not intractable, without status epilepticus: Secondary | ICD-10-CM | POA: Insufficient documentation

## 2012-08-01 DIAGNOSIS — M069 Rheumatoid arthritis, unspecified: Secondary | ICD-10-CM | POA: Insufficient documentation

## 2012-08-01 DIAGNOSIS — Z79899 Other long term (current) drug therapy: Secondary | ICD-10-CM | POA: Insufficient documentation

## 2012-08-01 DIAGNOSIS — F411 Generalized anxiety disorder: Secondary | ICD-10-CM | POA: Insufficient documentation

## 2012-08-01 DIAGNOSIS — Z860100 Personal history of colon polyps, unspecified: Secondary | ICD-10-CM | POA: Insufficient documentation

## 2012-08-01 DIAGNOSIS — I4891 Unspecified atrial fibrillation: Secondary | ICD-10-CM | POA: Insufficient documentation

## 2012-08-01 DIAGNOSIS — Z8639 Personal history of other endocrine, nutritional and metabolic disease: Secondary | ICD-10-CM | POA: Insufficient documentation

## 2012-08-01 DIAGNOSIS — F329 Major depressive disorder, single episode, unspecified: Secondary | ICD-10-CM | POA: Insufficient documentation

## 2012-08-01 DIAGNOSIS — Z8679 Personal history of other diseases of the circulatory system: Secondary | ICD-10-CM | POA: Insufficient documentation

## 2012-08-01 DIAGNOSIS — IMO0001 Reserved for inherently not codable concepts without codable children: Secondary | ICD-10-CM | POA: Insufficient documentation

## 2012-08-01 DIAGNOSIS — F429 Obsessive-compulsive disorder, unspecified: Secondary | ICD-10-CM | POA: Insufficient documentation

## 2012-08-01 DIAGNOSIS — E119 Type 2 diabetes mellitus without complications: Secondary | ICD-10-CM | POA: Insufficient documentation

## 2012-08-01 DIAGNOSIS — I10 Essential (primary) hypertension: Secondary | ICD-10-CM | POA: Insufficient documentation

## 2012-08-01 DIAGNOSIS — Z8739 Personal history of other diseases of the musculoskeletal system and connective tissue: Secondary | ICD-10-CM | POA: Insufficient documentation

## 2012-08-01 DIAGNOSIS — I252 Old myocardial infarction: Secondary | ICD-10-CM | POA: Insufficient documentation

## 2012-08-01 DIAGNOSIS — M3390 Dermatopolymyositis, unspecified, organ involvement unspecified: Secondary | ICD-10-CM | POA: Insufficient documentation

## 2012-08-01 DIAGNOSIS — Z7982 Long term (current) use of aspirin: Secondary | ICD-10-CM | POA: Insufficient documentation

## 2012-08-01 DIAGNOSIS — Z8669 Personal history of other diseases of the nervous system and sense organs: Secondary | ICD-10-CM | POA: Insufficient documentation

## 2012-08-01 DIAGNOSIS — K219 Gastro-esophageal reflux disease without esophagitis: Secondary | ICD-10-CM | POA: Insufficient documentation

## 2012-08-01 DIAGNOSIS — Z880 Allergy status to penicillin: Secondary | ICD-10-CM | POA: Insufficient documentation

## 2012-08-01 DIAGNOSIS — J4489 Other specified chronic obstructive pulmonary disease: Secondary | ICD-10-CM | POA: Insufficient documentation

## 2012-08-01 DIAGNOSIS — I7300 Raynaud's syndrome without gangrene: Secondary | ICD-10-CM | POA: Insufficient documentation

## 2012-08-01 NOTE — ED Provider Notes (Signed)
History     CSN: [id number]  Arrival date & time 08/01/12  1033   First MD Initiated Contact with Patient 08/01/12 1212      Chief Complaint  Patient presents with  . Suture / Staple Removal    (Consider location/radiation/quality/duration/timing/severity/associated sxs/prior treatment) HPI Comments: Patient presents for removal of staples on the right side of his scalp.  Staples were placed on 07/24/12.  Patient denies any drainage from the area.  Denies fever or chills.  Laceration scabbing over.  Denies any pain of the laceration at this time.    Patient is a 62 y.o. male presenting with suture removal. The history is provided by the patient.  Suture / Staple Removal This is a new problem.    Past Medical History  Diagnosis Date  . Rheumatoid arthritis(714.0)   . Obesity   . Major depression     with acute psychotic break in 06/2010  . Diabetes mellitus   . Hypertension   . Hyperlipidemia   . Diverticulosis of colon   . COPD (chronic obstructive pulmonary disease)   . Anxiety   . GERD (gastroesophageal reflux disease)   . Vertigo   . Seizures   . Fibromyalgia   . Dermatomyositis   . Myocardial infarct     mulitple (1999, 2000, 2004)  . Raynaud's disease   . Narcotic dependence   . Peripheral neuropathy   . Internal hemorrhoids   . Ischemic heart disease   . Hiatal hernia   . Gastritis   . Diverticulitis   . Hx of adenomatous colonic polyps   . Nephrolithiasis   . Anemia   . Esophageal stricture   . Esophageal dysmotility   . Dermatomyositis   . Paroxysmal a-fib   . Urge incontinence   . Otosclerosis   . Bipolar 1 disorder   . OCD (obsessive compulsive disorder)     Past Surgical History  Procedure Laterality Date  . Knee arthroscopy w/ meniscal repair  2009    left  . Lumbar disc surgery    . Squamous papilloma   2010    removed by Dr. Pollyann Kennedy ENT, noted on tongue    Family History  Problem Relation Age of Onset  . Alcohol abuse Mother   .  Depression Mother   . Heart disease Mother   . Diabetes Mother   . Stroke Mother   . Diabetes Other     1/2 brother    History  Substance Use Topics  . Smoking status: Current Every Day Smoker -- 0.50 packs/day    Types: Cigarettes  . Smokeless tobacco: Never Used     Comment: PATIENT STATES HE QUIT SMOKING LAST WINTER   . Alcohol Use: No     Comment: daily      Review of Systems  Allergies  Betamethasone dipropionate; Bupropion hcl; Ciprofloxacin; Clobetasol; Codeine; Escitalopram oxalate; Flagyl; Fluoxetine hcl; Fluticasone-salmeterol; Furosemide; Immune globulins; Paroxetine; Penicillins; Statins; Tacrolimus; Tetanus toxoid; and Tuberculin purified protein derivative  Home Medications   Current Outpatient Rx  Name  Route  Sig  Dispense  Refill  . ALPRAZolam (XANAX) 1 MG tablet   Oral   Take 1 mg by mouth 3 (three) times daily as needed for sleep or anxiety.         Marland Kitchen amiodarone (PACERONE) 200 MG tablet   Oral   Take 200 mg by mouth daily before breakfast.         . aspirin EC 81 MG tablet   Oral  Take 81 mg by mouth daily.         . collagenase (SANTYL) ointment   Topical   Apply 1 application topically daily.   15 g   2   . diclofenac (VOLTAREN) 75 MG EC tablet               . divalproex (DEPAKOTE) 500 MG DR tablet   Oral   Take 1 tablet (500 mg total) by mouth every 12 (twelve) hours.   60 tablet   0     Needs for Psychiatrist to fill in future   . esomeprazole (NEXIUM) 40 MG capsule   Oral   Take 1 capsule (40 mg total) by mouth daily before breakfast.   30 capsule   11   . Ipratropium-Albuterol (COMBIVENT) 20-100 MCG/ACT AERS respimat   Inhalation   Inhale 1 puff into the lungs every 6 (six) hours.         . magnesium oxide (MAG-OX) 400 MG tablet   Oral   Take 400 mg by mouth daily.         . methotrexate (RHEUMATREX) 2.5 MG tablet      2.5 mg See admin instructions. 3 times a week         . niacin (NIASPAN) 1000 MG CR  tablet   Oral   Take 1 tablet (1,000 mg total) by mouth at bedtime.   30 tablet   11   . nitroGLYCERIN (NITROSTAT) 0.4 MG SL tablet   Sublingual   Place 0.4 mg under the tongue every 5 (five) minutes as needed. For chest pain         . oxybutynin (DITROPAN) 5 MG tablet   Oral   Take 1 tablet (5 mg total) by mouth 3 (three) times daily.   90 tablet   3   . QUEtiapine (SEROQUEL) 200 MG tablet   Oral   Take 1 tablet (200 mg total) by mouth at bedtime.   30 tablet   0   . rOPINIRole (REQUIP) 2 MG tablet   Oral   Take 1 tablet (2 mg total) by mouth 3 (three) times daily.   360 tablet   0     Needs to be addressed by Psychiatrist for further  ...   . sulfamethoxazole-trimethoprim (BACTRIM DS) 800-160 MG per tablet   Oral   Take 1 tablet by mouth 2 (two) times daily.   20 tablet   0   . sulfamethoxazole-trimethoprim (BACTRIM DS,SEPTRA DS) 800-160 MG per tablet   Oral   Take 1 tablet by mouth 2 (two) times daily. One po bid x 7 days   14 tablet   0   . torsemide (DEMADEX) 20 MG tablet   Oral   Take 1 tablet (20 mg total) by mouth daily.   30 tablet   2     BP 138/79  Pulse 65  Temp(Src) 98.6 F (37 C) (Oral)  Resp 20  SpO2 96%  Physical Exam  Nursing note and vitals reviewed. Constitutional: He appears well-developed and well-nourished.  HENT:  Laceration of the right scalp well approximated and scabbed over.  No drainage.  No surrounding erythema or edema.    Cardiovascular: Normal rate, regular rhythm and normal heart sounds.   Pulmonary/Chest: Effort normal and breath sounds normal.  Neurological: He is alert.  Skin: Skin is warm and dry.  Psychiatric: He has a normal mood and affect.    ED Course  Procedures (including critical care time)  Labs Reviewed - No data to display No results found.   1. Encounter for staple removal       MDM  Patient presenting to have staples removed from scalp.  Staples removed without difficulty.  No signs  of infection at this time.        Pascal Lux Dixon, PA-C 08/01/12 1646

## 2012-08-01 NOTE — ED Provider Notes (Signed)
Medical screening examination/treatment/procedure(s) were performed by non-physician practitioner and as supervising physician I was immediately available for consultation/collaboration.  Raeford Razor, MD 08/01/12 1650

## 2012-08-01 NOTE — ED Notes (Addendum)
Pt was seen here for fall last week.  Here for staple removal.  Wound appears to be healing well.  Denies any pain at this time.

## 2012-08-04 ENCOUNTER — Encounter: Payer: Self-pay | Admitting: Sports Medicine

## 2012-08-04 ENCOUNTER — Ambulatory Visit (INDEPENDENT_AMBULATORY_CARE_PROVIDER_SITE_OTHER): Payer: Medicare HMO | Admitting: Sports Medicine

## 2012-08-04 VITALS — BP 140/80 | HR 77 | Temp 99.8°F | Ht 66.0 in | Wt 208.0 lb

## 2012-08-04 DIAGNOSIS — E119 Type 2 diabetes mellitus without complications: Secondary | ICD-10-CM

## 2012-08-04 DIAGNOSIS — F29 Unspecified psychosis not due to a substance or known physiological condition: Secondary | ICD-10-CM

## 2012-08-04 DIAGNOSIS — F1027 Alcohol dependence with alcohol-induced persisting dementia: Secondary | ICD-10-CM

## 2012-08-04 DIAGNOSIS — I10 Essential (primary) hypertension: Secondary | ICD-10-CM

## 2012-08-04 DIAGNOSIS — K5732 Diverticulitis of large intestine without perforation or abscess without bleeding: Secondary | ICD-10-CM

## 2012-08-04 DIAGNOSIS — Z79899 Other long term (current) drug therapy: Secondary | ICD-10-CM

## 2012-08-04 MED ORDER — AMIODARONE HCL 200 MG PO TABS: 200.0000 mg | ORAL_TABLET | Freq: Every day | ORAL | Status: DC

## 2012-08-04 MED ORDER — ALPRAZOLAM 1 MG PO TABS: 1.0000 mg | ORAL_TABLET | Freq: Three times a day (TID) | ORAL | Status: DC | PRN

## 2012-08-04 NOTE — Patient Instructions (Addendum)
Please follow up with Dr. Juanda Chance regarding your abdominal pain; I'm not sure what antibiotic would be appropriate to use at this time.    I have refilled your Xanax and amiodarone.  Keep your followup appointment with psychiatry  Return in 1 month

## 2012-08-11 NOTE — Assessment & Plan Note (Signed)
Unclear if this is actually contributory.

## 2012-08-11 NOTE — Assessment & Plan Note (Signed)
Stable/Well Controlled - no changes at this time. 

## 2012-08-11 NOTE — Assessment & Plan Note (Signed)
No change 

## 2012-08-11 NOTE — Assessment & Plan Note (Signed)
Has stopped his Bactrim as he appears to be allergic to this.  He does report a history of sulfa allergies. Reports the pain is moderate.  I do not have any options to put him on for antibiotics.  He denies any nausea or vomiting, fevers or chills and does not appear to be overtly ill.  His vital signs are otherwise stable. I will defer antibiotics and have encouraged him to followup with his gastroenterologist ASAP

## 2012-08-11 NOTE — Assessment & Plan Note (Signed)
Has followup with psychiatry.  Did have multiple hospitalizations or earlier this year.  He does appear to be slightly more stable at this time.  Patient does need to go to psychiatry for further prescription refills.  He does have a small supply remaining but must be seen by psychiatry for any further refills. Patient voices understanding.  The patient's partially full bottle of Xanax was returned to [facility].  It was secured in the same location that it was initially left and Dr. Macky Lower office.

## 2012-08-11 NOTE — Progress Notes (Signed)
  [facility]  Patient name: Patrick Holt MRN [id number]  Date of birth: [birth year]  CC & HPI:  Patrick Holt is a 62 y.o. male presenting today for follow up of:  #  Hypertension - chronic problem, well controlled.    Home BP checks/ranges: not checking  no orthostasis  no chest pain, no dyspnea on exertion, mild orthopnea/PND, mild worsening peripheral edema,   no episodes of unilateral weakness, dysarthria or acute visual changes  #  Diabetes - chronic problem, very well controlled.   Blood Sugar checks/ranges: Not checking  no hypoglycemic symptoms/episodes.   no poluria, no polydipsia,  no new visual problems,  LE dysesthesias: Mild - unchanged associated more with Dermatomyositis  self foot checks performed: Rarely  no chest pain, no dyspnea on exertion, no orthopnea/PND, no peripheral edema,   no episodes of unilateral weakness, dysarthria or acute visual changes  Did well following his last dystrophic nails open  # Dermatomyositis: Reports doing relatively well.  Not require his chronic pain medication  # anxiety and psychiatric issues: He has been very anxious recently.  Has been off of Xanax.  Requesting refill.  Is going to followup with Dr. Lolly Mustache on 09/25/12.    ------------------------------------------------------------------------------------------------------------------ Medication Compliance: compliant most of the time still very poor insight as to what medications he is on for what.   Diet Compliance: noncompliant much of the time ------------------------------------------------------------------------------------------------------------------ New Concerns:  Abdominal Pain: Reports that he has discontinue his antibiotics that was started during his last ED visit because it was causing him to rash in his genital area.  He does report a allergy to sulfa antibiotics.  Also allergic to multiple other antibiotics.   ROS:  PER  HPI  Pertinent History Reviewed:  Medical & Surgical Hx:  Reviewed: Significant for multiple recent hospitalizations for psychiatric issues.  Has not followed up with psychiatry Medications: Reviewed & Updated - See associated section in EMR Social History: Reviewed -  reports that he has been smoking Cigarettes.  He has been smoking about 0.50 packs per day. He has never used smokeless tobacco.   Objective Findings:  Vitals: BP 140/80  Pulse 77  Temp(Src) 99.8 F (37.7 C) (Oral)  Ht 5\' 6"  (1.676 m)  Wt 208 lb (94.348 kg)  BMI 33.59 kg/m2  PE: GENERAL:  Somewhat disheveled adult chronically ill a well-developed male. In no discomfort; no respiratory distress.  Skin is bronzed appearing PSYCH: Alert and appropriately interactive; Insight:Fair has occasional moments of clarity but continues to have tangential thoughts and is difficult to focus.  He appears to be overwhelmed.  H&N: AT/[place], trachea midline EENT:  MMM, no scleral icterus, EOMi, no oral lesions appreciated but poor dentition HEART: Irregularly irregular., S1/S2 heard, no murmur LUNGS: CTA B, no wheezes, no crackles EXTREMITIES: Moves all 4 extremities spontaneously, warm well perfused, no edema, bilateral DP and PT pulses 2/4.     Assessment & Plan:

## 2012-08-11 NOTE — Assessment & Plan Note (Addendum)
Complete med reviewed again today.

## 2012-08-14 ENCOUNTER — Telehealth: Payer: Self-pay | Admitting: Nurse Practitioner

## 2012-08-14 NOTE — Telephone Encounter (Signed)
Patient states the Biaxin "blistered me bad so I stopped it. " He is asking for an earlier appointment on 09/05/12 d/t transportation problems. Moved to 10?45 AM.

## 2012-09-05 ENCOUNTER — Encounter: Payer: Self-pay | Admitting: Internal Medicine

## 2012-09-05 ENCOUNTER — Ambulatory Visit: Payer: Self-pay | Admitting: Internal Medicine

## 2012-09-05 ENCOUNTER — Ambulatory Visit (INDEPENDENT_AMBULATORY_CARE_PROVIDER_SITE_OTHER): Payer: Medicare HMO | Admitting: Internal Medicine

## 2012-09-05 VITALS — BP 138/76 | HR 60 | Ht 65.0 in | Wt 209.2 lb

## 2012-09-05 DIAGNOSIS — R1013 Epigastric pain: Secondary | ICD-10-CM

## 2012-09-05 DIAGNOSIS — K5732 Diverticulitis of large intestine without perforation or abscess without bleeding: Secondary | ICD-10-CM

## 2012-09-05 DIAGNOSIS — R1032 Left lower quadrant pain: Secondary | ICD-10-CM

## 2012-09-05 DIAGNOSIS — M3390 Dermatopolymyositis, unspecified, organ involvement unspecified: Secondary | ICD-10-CM

## 2012-09-05 MED ORDER — ALPRAZOLAM 1 MG PO TABS: 1.0000 mg | ORAL_TABLET | Freq: Three times a day (TID) | ORAL | Status: DC | PRN

## 2012-09-05 NOTE — Patient Instructions (Addendum)
We have sent the following medications to your pharmacy for you to pick up at your convenience: Xanax (This is a 1 time prescription. Further prescriptions for Xanax must be gotten from your PCP/Psychiatrist per Dr Juanda Chance)  Patrick Holt have been scheduled for an endoscopy and colonoscopy at [facility] on 09/27/12. You will be admitted to the hospital the day prior to your procedure so you may prep. The hospital will contact you when a bed becomes available on the date you are to be admitted (09/26/12).  CC: Dr Berline Chough [phone]

## 2012-09-05 NOTE — Progress Notes (Signed)
Patrick Holt 01/25/1951 MRN [id number]  History of Present Illness:  This is a 62 year old white male whom I have known for over 20 years, initially under the name of Patrick Holt, who assumed his mother's last name after her death in 07-22-02. He has had dermatomyositis for many years and at one point was wheelchair-bound. He is currently walking with a walker. He has been having his colorectal screening every 5 years because of increased incidence of colorectal cancer in dermatomyositis.Marland Kitchen He had several colonoscopies and has a history of adenomatous polyps in 07/22/03 and most recently in 07/22/2007. He has moderate to severe diverticulosis and is currently complaining of lower abdominal pain. He has a history of diverticulitis on a CT scan in September 2005 which showed stranding of the pericolic fat in the lower descending colon. He has multiple antibiotic allergies including  He was put on sulfa recently but developed a rash. His most recent CT scan of the abdomen in 06/06/2011showed bilateral renal cysts but no evidence of diverticulitis. An ultrasound of the gallbladder in July 21, 2005 showed a normal gallbladder and common bile duct of 6 mm. He has a history of dysphagia. In December 2000, a barium esophagram showed a 13 mm tablet to pass without delay. There were multiple tertiary contractions and a small hiatal hernia. He is having dyspepsia and epigastric discomfort. He has been on Nexium 40 mg daily. He has been on methotrexate 2.5 mg 3 times a week for dermatomyositis and he was on high-dose prednisone for many years but this was slowly tapered and he is currently on no steroids. He feels that his dermatomyositis started after an allergic reaction to a PPD skin test. He did actually  legal action against the physicians whoadministerd the skin test, but the case has been dismissed without finding anybody guilty.    Past Medical History  Diagnosis Date  . Rheumatoid arthritis(714.0)   . Obesity   . Major  depression     with acute psychotic break in July 22, 2010  . Diabetes mellitus   . Hypertension   . Hyperlipidemia   . Diverticulosis of colon   . COPD (chronic obstructive pulmonary disease)   . Anxiety   . GERD (gastroesophageal reflux disease)   . Vertigo   . Seizures   . Fibromyalgia   . Dermatomyositis   . Myocardial infarct     mulitple Jul 21, 1997, 22-Jul-1998, 07/22/2002)  . Raynaud's disease   . Narcotic dependence   . Peripheral neuropathy   . Internal hemorrhoids   . Ischemic heart disease   . Hiatal hernia   . Gastritis   . Diverticulitis   . Hx of adenomatous colonic polyps   . Nephrolithiasis   . Anemia   . Esophageal stricture   . Esophageal dysmotility   . Dermatomyositis   . Paroxysmal a-fib   . Urge incontinence   . Otosclerosis   . Bipolar 1 disorder   . OCD (obsessive compulsive disorder)    Past Surgical History  Procedure Laterality Date  . Knee arthroscopy w/ meniscal repair  Jul 22, 2007    left  . Lumbar disc surgery    . Squamous papilloma   2010    removed by Dr. Pollyann Kennedy ENT, noted on tongue    reports that he has been smoking Cigarettes.  He has been smoking about 0.50 packs per day. He has never used smokeless tobacco. He reports that he does not drink alcohol or use illicit drugs. family history includes Alcohol abuse in his  mother; Depression in his mother; Diabetes in his mother and other; Heart disease in his mother; and Stroke in his mother. Allergies  Allergen Reactions  . Betamethasone Dipropionate     Unknown  . Bupropion Hcl     Unknown  . Ciprofloxacin     REACTION: swelling  . Clobetasol     Unknown  . Codeine     Unknown  . Escitalopram Oxalate     Unknown  . Flagyl (Metronidazole)     REACTION: swelling  . Fluoxetine Hcl     Unknown  . Fluticasone-Salmeterol     Unknown  . Furosemide     Unknown  . Immune Globulins     Acute renal failure  . Paroxetine     Unknown  . Penicillins     Unknown  . Statins     Unknown  . Sulfa Antibiotics    . Tacrolimus     Unknown  . Tetanus Toxoid     Unknown  . Tuberculin Purified Protein Derivative     Unknown        Review of Systems:denies dysphagia. Occasional heartburn and epigastric pain , denies rectal bleeding.-positive follow abdominal pain   The remainder of the 10 point ROS is negative except as outlined in H&P   Physical Exam: General appearance  Well developed, in no distress.walks with a walker Eyes- non icteric. HEENT nontraumatic, normocephalic. Mouth no lesions, tongue papillated, no cheilosis. Neck supple without adenopathy, thyroid not enlarged, no carotid bruits, no JVD. Lungs Clear to auscultation bilaterally. Cor normal S1, normal S2, regular rhythm, no murmur,  quiet precordium. Abdomen:  soft relaxed with normoactive bowel sounds. Tenderness in epigastrium and in left lower quadrant. No palpable mass.  Rectal: normal perianal area. Normal rectal sphincter tone. Soft Hemoccult negative stool.  Extremities no pedal edema. Skin no lesions. Neurological alert and oriented x 3. Psychological normal mood and affect.  Assessment and Plan:  Problem #51 62 year old white male with dermatomyositis is at higher risk for colorectal neoplasia. He will be due for a recall colonoscopy in the next few months but because of his lower GI symptoms and severity of his recent diverticulitis flare, we will go ahead and schedule him for a colonoscopy now. He has a personal history of adenomatous polyps on 2 separate occasions.He could take Doxycycline if he had to.  Problem #2 Dyspepsia. Patient has had normal gallbladder studies. He has a history of esophageal dysmotility which may be related to dermatomyositis. We will proceed with an upper endoscopy. He will continue Nexium 40 mg daily.  Since patient is unable to have anyone present with him for his procedure, we will admit him prior to his procedure for prep and he will stay after the procedure for a short time as  well.  Problem #3 Depression and anxiety. Patient has already been referred to a psychiatrist. His appointment is August 20th. He ran out of Xanax and he we will given a refill of which must last him until August 20. After that time, he should get refills from his psychiatrist.   09/05/2012 Lina Sar

## 2012-09-08 ENCOUNTER — Ambulatory Visit (INDEPENDENT_AMBULATORY_CARE_PROVIDER_SITE_OTHER): Payer: Medicare HMO | Admitting: Sports Medicine

## 2012-09-08 VITALS — BP 144/93 | HR 66 | Temp 98.0°F | Wt 207.8 lb

## 2012-09-08 DIAGNOSIS — M3313 Other dermatomyositis without myopathy: Secondary | ICD-10-CM

## 2012-09-08 DIAGNOSIS — K5730 Diverticulosis of large intestine without perforation or abscess without bleeding: Secondary | ICD-10-CM

## 2012-09-08 DIAGNOSIS — M069 Rheumatoid arthritis, unspecified: Secondary | ICD-10-CM

## 2012-09-08 DIAGNOSIS — M3390 Dermatopolymyositis, unspecified, organ involvement unspecified: Secondary | ICD-10-CM

## 2012-09-08 DIAGNOSIS — IMO0001 Reserved for inherently not codable concepts without codable children: Secondary | ICD-10-CM

## 2012-09-08 DIAGNOSIS — R52 Pain, unspecified: Secondary | ICD-10-CM

## 2012-09-08 DIAGNOSIS — F29 Unspecified psychosis not due to a substance or known physiological condition: Secondary | ICD-10-CM

## 2012-09-08 MED ORDER — TORSEMIDE 20 MG PO TABS: 20.0000 mg | ORAL_TABLET | Freq: Every day | ORAL | Status: DC

## 2012-09-08 MED ORDER — COLLAGENASE 250 UNIT/GM EX OINT: 1.0000 "application " | TOPICAL_OINTMENT | Freq: Every day | CUTANEOUS | Status: DC

## 2012-09-08 NOTE — Assessment & Plan Note (Signed)
Consider Restarting Methotrexate with appropriate monitoring after colonoscopy.   Cannot get in to see Rheumatology >AVOID STEROIDS AND OPIOIDS

## 2012-09-08 NOTE — Progress Notes (Signed)
  [facility]  Patient name: Patrick Holt MRN [id number]  Date of birth: 1950/03/29  CC & HPI:  Patrick Holt is a 62 y.o. male presenting today for follow up of:  # Abdominal pain  Character & Context:  Seen by Dr. Juanda Chance Plans colonoscopy and hospitalization for cleanout Somewhat improved currently   # [facility]  Character & Context:  has appointment with psych coming up seems to be stable  Onset/Duration:     Severity:    Aggravating:  off meds  Associated Symptoms:  stable Anxiety is stable on Xanax, overall pain worsening, mood/affect  Effective Therapies:  Xanax  Inneffective Therapies:  Has not been taking mood stablizers Has taken > 6 months to schedule f/u with PSYCH  ROS/RED FLAGS:  no SI/HI, no evidence of hallucinations   # Chronic Pain back, RA, Dermatomysitis:  Context  Long standing autoimmune process Has been off methotrexate for quite some time Not willing to see chronic pain specialists  Onset/Duration:  long standing  Illicit ing Event/Injury:  No  Character:  Chronic, low back pain   Severity:  moderate to severe  Radiation:  none  Aggravating:  none  Associated Symptoms:  associated fall recently while out gardening  Effective Therapies:    Inneffective Therapies:    RED FLAGS:  no changes in bowel/bladder   ------------------------------------------------------------------------------------------------------------------ New Concerns:  Shrinking: reports height drop in past 1 year  ------------------------------------------------------------------------------------------------------------------ MEDICATIONS: compliant all of the time   TLC Compliance: Compliant most of the time       ROS:  PER HPI  Pertinent History Reviewed:  Medical & Surgical Hx:  Reviewed: Significant for psychiatric hospitalization earlier this year Medications: Reviewed & Updated - See associated section in EMR Social History: Reviewed -  reports that he  has been smoking Cigarettes.  He has been smoking about 0.50 packs per day. He has never used smokeless tobacco.  Objective Findings:  Vitals: BP 144/93  Pulse 66  Temp(Src) 98 F (36.7 C) (Oral)  Wt 207 lb 12.8 oz (94.257 kg)  BMI 34.58 kg/m2  PE: GENERAL: Adult caucasian  male. In no discomfort; no respiratory distress ruborus skin tone   PSYCH:  alert and appropriate, good insight   HNEENT:    CARDIO:  RRR, S1/S2 heard, no murmur  LUNGS:  CTA B, no wheezes, no crackles  ABDOMEN:    EXTREM:   GU:   SKIN:   NEUROMSK: Stooped position to walk Using walker to ambulate     Assessment & Plan:

## 2012-09-08 NOTE — Assessment & Plan Note (Signed)
Has f/u Encouraged to have Psychiatrist address mood stabilizer Would likely benefit from being back on Cymbalta but concern for exacerbation of manic episode (has previously occurred) if isn't on Mood Stablizer.  Pt has not wanted to take mood stablizers in the past. Has appointment with me following psych appointment >Will need Xanax Refilled if not done at psychiatrist

## 2012-09-08 NOTE — Assessment & Plan Note (Signed)
Not on anti-rheum agent Likely needs Methotrexate but will defer until after colonoscopy

## 2012-09-08 NOTE — Patient Instructions (Addendum)
It was nice to see you today.   Today we discussed: 1. DIVERTICULOSIS OF COLON I'm glad that Dr. Dickie La is getting you set up for a colonoscopy  Dermatomyositis & Rheumatoid arthritis &  FIBROMYALGIA  6. Psychotic disorder Please keep your appointment with your psychiatrist   Please plan to return to see me in 1 month as scheduled.  If you need anything prior to seeing me please call the clinic.  Please Bring all medications with you to each appointment.

## 2012-09-08 NOTE — Assessment & Plan Note (Signed)
Likely should be on CYMBALTA but cannot without mood stablizer

## 2012-09-08 NOTE — Assessment & Plan Note (Signed)
Off pain meds Likely needs anti-rheumatlogic agent Would discourage pain medications >Consider work up for compression fx if not significantly improved (DEXA and Bisphosphonate)

## 2012-09-22 ENCOUNTER — Telehealth: Payer: Self-pay | Admitting: Internal Medicine

## 2012-09-22 NOTE — Telephone Encounter (Signed)
Spoke with patient and told him he is on the bed placement list on 09/26/12. Verified this with bed placement. Patient states he was called by a man at pre service who told him he did not see him being admitted on 09/27/12.

## 2012-09-25 ENCOUNTER — Ambulatory Visit (HOSPITAL_COMMUNITY): Payer: Medicare HMO | Admitting: Psychiatry

## 2012-09-26 ENCOUNTER — Telehealth: Payer: Self-pay | Admitting: Internal Medicine

## 2012-09-26 ENCOUNTER — Encounter (HOSPITAL_COMMUNITY): Payer: Self-pay

## 2012-09-26 ENCOUNTER — Observation Stay (HOSPITAL_COMMUNITY)
Admission: AD | Admit: 2012-09-26 | Discharge: 2012-09-27 | Disposition: A | Discharge status: Home / Self Care | Payer: Medicare HMO | Source: Ambulatory Visit | Attending: Internal Medicine | Admitting: Internal Medicine

## 2012-09-26 DIAGNOSIS — I252 Old myocardial infarction: Secondary | ICD-10-CM | POA: Insufficient documentation

## 2012-09-26 DIAGNOSIS — M3313 Other dermatomyositis without myopathy: Secondary | ICD-10-CM | POA: Insufficient documentation

## 2012-09-26 DIAGNOSIS — K224 Dyskinesia of esophagus: Secondary | ICD-10-CM

## 2012-09-26 DIAGNOSIS — M069 Rheumatoid arthritis, unspecified: Secondary | ICD-10-CM | POA: Insufficient documentation

## 2012-09-26 DIAGNOSIS — K3189 Other diseases of stomach and duodenum: Secondary | ICD-10-CM | POA: Insufficient documentation

## 2012-09-26 DIAGNOSIS — K5730 Diverticulosis of large intestine without perforation or abscess without bleeding: Secondary | ICD-10-CM | POA: Insufficient documentation

## 2012-09-26 DIAGNOSIS — K222 Esophageal obstruction: Principal | ICD-10-CM | POA: Insufficient documentation

## 2012-09-26 DIAGNOSIS — K2970 Gastritis, unspecified, without bleeding: Secondary | ICD-10-CM | POA: Insufficient documentation

## 2012-09-26 DIAGNOSIS — R1032 Left lower quadrant pain: Secondary | ICD-10-CM

## 2012-09-26 DIAGNOSIS — Z993 Dependence on wheelchair: Secondary | ICD-10-CM | POA: Insufficient documentation

## 2012-09-26 DIAGNOSIS — E119 Type 2 diabetes mellitus without complications: Secondary | ICD-10-CM | POA: Insufficient documentation

## 2012-09-26 DIAGNOSIS — I259 Chronic ischemic heart disease, unspecified: Secondary | ICD-10-CM

## 2012-09-26 DIAGNOSIS — J4489 Other specified chronic obstructive pulmonary disease: Secondary | ICD-10-CM | POA: Insufficient documentation

## 2012-09-26 DIAGNOSIS — M3390 Dermatopolymyositis, unspecified, organ involvement unspecified: Secondary | ICD-10-CM

## 2012-09-26 DIAGNOSIS — D126 Benign neoplasm of colon, unspecified: Secondary | ICD-10-CM | POA: Insufficient documentation

## 2012-09-26 DIAGNOSIS — I10 Essential (primary) hypertension: Secondary | ICD-10-CM | POA: Insufficient documentation

## 2012-09-26 DIAGNOSIS — J449 Chronic obstructive pulmonary disease, unspecified: Secondary | ICD-10-CM | POA: Insufficient documentation

## 2012-09-26 DIAGNOSIS — K219 Gastro-esophageal reflux disease without esophagitis: Secondary | ICD-10-CM | POA: Insufficient documentation

## 2012-09-26 DIAGNOSIS — Z7982 Long term (current) use of aspirin: Secondary | ICD-10-CM | POA: Insufficient documentation

## 2012-09-26 DIAGNOSIS — R1013 Epigastric pain: Secondary | ICD-10-CM | POA: Insufficient documentation

## 2012-09-26 LAB — COMPREHENSIVE METABOLIC PANEL
Alkaline Phosphatase: 87 U/L (ref 39–117)
BUN: 8 mg/dL (ref 6–23)
CO2: 33 mEq/L — ABNORMAL HIGH (ref 19–32)
Calcium: 8.7 mg/dL (ref 8.4–10.5)
GFR calc Af Amer: >90 mL/min (ref >90)
GFR calc non Af Amer: 89 mL/min — ABNORMAL LOW (ref >90)
Glucose, Bld: 85 mg/dL (ref 70–99)
Potassium: 2.9 mEq/L — ABNORMAL LOW (ref 3.5–5.1)
Total Protein: 6.3 g/dL (ref 6.0–8.3)

## 2012-09-26 LAB — CK TOTAL AND CKMB (NOT AT ARMC)
CK, MB: 4.2 ng/mL — ABNORMAL HIGH (ref 0.3–4.0)
Total CK: 150 U/L (ref 7–232)

## 2012-09-26 MED ORDER — NITROGLYCERIN 0.4 MG SL SUBL: 0.4000 mg | SUBLINGUAL_TABLET | SUBLINGUAL | Status: DC | PRN

## 2012-09-26 MED ORDER — PEG-KCL-NACL-NASULF-NA ASC-C 100 G PO SOLR
0.5000 | Freq: Once | ORAL | Status: AC
  Administered 2012-09-26: 100 g via ORAL
  Filled 2012-09-26: qty 1

## 2012-09-26 MED ORDER — ASPIRIN EC 81 MG PO TBEC
81.0000 mg | DELAYED_RELEASE_TABLET | Freq: Every day | ORAL | Status: DC
  Administered 2012-09-27: 81 mg via ORAL
  Filled 2012-09-26: qty 1

## 2012-09-26 MED ORDER — ONDANSETRON HCL 4 MG/2ML IJ SOLN: 4.0000 mg | Freq: Four times a day (QID) | INTRAMUSCULAR | Status: DC | PRN

## 2012-09-26 MED ORDER — PEG-KCL-NACL-NASULF-NA ASC-C 100 G PO SOLR
0.5000 | Freq: Once | ORAL | Status: AC
  Administered 2012-09-27: 100 g via ORAL

## 2012-09-26 MED ORDER — AMIODARONE HCL 200 MG PO TABS
200.0000 mg | ORAL_TABLET | Freq: Every day | ORAL | Status: DC
  Filled 2012-09-26 (×2): qty 1

## 2012-09-26 MED ORDER — ROPINIROLE HCL 1 MG PO TABS
2.0000 mg | ORAL_TABLET | Freq: Three times a day (TID) | ORAL | Status: DC
  Administered 2012-09-26 – 2012-09-27 (×3): 2 mg via ORAL
  Filled 2012-09-26 (×5): qty 2

## 2012-09-26 MED ORDER — OXYBUTYNIN CHLORIDE 5 MG PO TABS
5.0000 mg | ORAL_TABLET | Freq: Three times a day (TID) | ORAL | Status: DC
  Administered 2012-09-26 – 2012-09-27 (×3): 5 mg via ORAL
  Filled 2012-09-26 (×5): qty 1

## 2012-09-26 MED ORDER — SODIUM CHLORIDE 0.9 % IV SOLN
INTRAVENOUS | Status: DC
  Administered 2012-09-26: 22:00:00 via INTRAVENOUS

## 2012-09-26 MED ORDER — PEG-KCL-NACL-NASULF-NA ASC-C 100 G PO SOLR: 1.0000 | Freq: Once | ORAL | Status: DC

## 2012-09-26 MED ORDER — IPRATROPIUM-ALBUTEROL 20-100 MCG/ACT IN AERS
1.0000 | INHALATION_SPRAY | Freq: Four times a day (QID) | RESPIRATORY_TRACT | Status: DC
  Administered 2012-09-26 – 2012-09-27 (×3): 1 via RESPIRATORY_TRACT
  Filled 2012-09-26: qty 4

## 2012-09-26 MED ORDER — MORPHINE SULFATE 2 MG/ML IJ SOLN
2.0000 mg | INTRAMUSCULAR | Status: DC | PRN
  Administered 2012-09-26 – 2012-09-27 (×4): 2 mg via INTRAVENOUS
  Filled 2012-09-26 (×4): qty 1

## 2012-09-26 MED ORDER — ALPRAZOLAM 1 MG PO TABS
1.0000 mg | ORAL_TABLET | Freq: Three times a day (TID) | ORAL | Status: DC | PRN
  Administered 2012-09-27: 1 mg via ORAL
  Filled 2012-09-26: qty 1

## 2012-09-26 MED ORDER — ONDANSETRON HCL 4 MG PO TABS: 4.0000 mg | ORAL_TABLET | Freq: Four times a day (QID) | ORAL | Status: DC | PRN

## 2012-09-26 MED ORDER — OXYCODONE HCL 5 MG PO TABS
5.0000 mg | ORAL_TABLET | ORAL | Status: DC | PRN
  Administered 2012-09-26: 5 mg via ORAL
  Filled 2012-09-26: qty 1

## 2012-09-26 MED ORDER — TORSEMIDE 20 MG PO TABS
20.0000 mg | ORAL_TABLET | Freq: Every day | ORAL | Status: DC
  Administered 2012-09-27: 20 mg via ORAL
  Filled 2012-09-26: qty 1

## 2012-09-26 NOTE — Progress Notes (Signed)
Patient has computer, but cannot afford internet

## 2012-09-26 NOTE — H&P (Signed)
Primary Care Physician:  Gaspar Bidding, DO Primary Gastroenterologist:   Lina Sar, MD  HPI: Patrick Holt is a 62 y.o. male with multiple medical problems. He has dermatomyositis for many years and at one point was wheelchair-bound. Patient known to Dr. Juanda Chance for history of GERD, diverticulitis and adenomatous colon polyps. He gets colorectal screening every 5 years because of increased incidence of colorectal cancer in dermatomyositis. He had several colonoscopies and has a history of adenomatous polyps in 2005 and most recently in 2009.  Patient saw Dr. Juanda Chance 09/05/12 with complaints of dyspepsia / epigastric discomfort.Marland Kitchen EGD was planned for further evaluation. He is due for colonoscopy but because of concern about his ability to prep as well as find transportation the decision was made to admit patient to Observation for prep and colonoscopy.   Past Medical History  Diagnosis Date  . Rheumatoid arthritis(714.0)   . Obesity   . Major depression     with acute psychotic break in 06/2010  . Diabetes mellitus   . Hypertension   . Hyperlipidemia   . Diverticulosis of colon   . COPD (chronic obstructive pulmonary disease)   . Anxiety   . GERD (gastroesophageal reflux disease)   . Vertigo   . Seizures   . Fibromyalgia   . Dermatomyositis   . Myocardial infarct     mulitple (1999, 2000, 2004)  . Raynaud's disease   . Narcotic dependence   . Peripheral neuropathy   . Internal hemorrhoids   . Ischemic heart disease   . Hiatal hernia   . Gastritis   . Diverticulitis   . Hx of adenomatous colonic polyps   . Nephrolithiasis   . Anemia   . Esophageal stricture   . Esophageal dysmotility   . Dermatomyositis   . Paroxysmal a-fib   . Urge incontinence   . Otosclerosis   . Bipolar 1 disorder   . OCD (obsessive compulsive disorder)     Past Surgical History  Procedure Laterality Date  . Knee arthroscopy w/ meniscal repair  2009    left  . Lumbar disc surgery    . Squamous  papilloma   2010    removed by Dr. Pollyann Kennedy ENT, noted on tongue    Prior to Admission medications   Medication Sig Start Date End Date Taking? Authorizing Provider  ALPRAZolam Prudy Feeler) 1 MG tablet Take 1 tablet (1 mg total) by mouth 3 (three) times daily as needed for sleep or anxiety. 09/05/12  Yes Hart Carwin, MD  amiodarone (PACERONE) 200 MG tablet Take 1 tablet (200 mg total) by mouth daily before breakfast. 08/04/12  Yes Andrena Mews, DO  aspirin EC 81 MG tablet Take 81 mg by mouth daily.   Yes Historical Provider, MD  collagenase (SANTYL) ointment Apply 1 application topically daily. 09/08/12  Yes Andrena Mews, DO  esomeprazole (NEXIUM) 40 MG capsule Take 1 capsule (40 mg total) by mouth daily before breakfast. 06/29/12  Yes Andrena Mews, DO  Ipratropium-Albuterol (COMBIVENT) 20-100 MCG/ACT AERS respimat Inhale 1 puff into the lungs every 6 (six) hours. 02/23/12  Yes Andrena Mews, DO  methotrexate (RHEUMATREX) 2.5 MG tablet 2.5 mg See admin instructions. 3 times a week 04/23/12  Yes Historical Provider, MD  oxybutynin (DITROPAN) 5 MG tablet Take 1 tablet (5 mg total) by mouth 3 (three) times daily. 06/29/12 06/29/13 Yes Andrena Mews, DO  rOPINIRole (REQUIP) 2 MG tablet Take 1 tablet (2 mg total) by mouth 3 (three) times daily.  07/19/12  Yes Andrena Mews, DO  torsemide (DEMADEX) 20 MG tablet Take 1 tablet (20 mg total) by mouth daily. 09/08/12  Yes Andrena Mews, DO  nitroGLYCERIN (NITROSTAT) 0.4 MG SL tablet Place 0.4 mg under the tongue every 5 (five) minutes as needed. For chest pain 02/25/12   Andrena Mews, DO    Current Facility-Administered Medications  Medication Dose Route Frequency Provider Last Rate Last Dose  . 0.9 %  sodium chloride infusion   Intravenous Continuous Meredith Pel, NP      . ALPRAZolam Prudy Feeler) tablet 1 mg  1 mg Oral TID PRN Meredith Pel, NP      . Melene Muller ON 09/27/2012] amiodarone (PACERONE) tablet 200 mg  200 mg Oral QAC breakfast Meredith Pel, NP      . Melene Muller ON 09/27/2012] aspirin EC tablet 81 mg  81 mg Oral Daily Meredith Pel, NP      . Ipratropium-Albuterol (COMBIVENT) respimat 1 puff  1 puff Inhalation QID Meredith Pel, NP      . nitroGLYCERIN (NITROSTAT) SL tablet 0.4 mg  0.4 mg Sublingual Q5 min PRN Meredith Pel, NP      . ondansetron [facility]) tablet 4 mg  4 mg Oral Q6H PRN Meredith Pel, NP       Or  . ondansetron [facility]) injection 4 mg  4 mg Intravenous Q6H PRN Meredith Pel, NP      . oxybutynin (DITROPAN) tablet 5 mg  5 mg Oral TID Meredith Pel, NP      . rOPINIRole (REQUIP) tablet 2 mg  2 mg Oral TID Meredith Pel, NP      . Melene Muller ON 09/27/2012] torsemide (DEMADEX) tablet 20 mg  20 mg Oral Daily Meredith Pel, NP        Allergies as of 09/05/2012 - Review Complete 09/05/2012  Allergen Reaction Noted  . Betamethasone dipropionate  05/31/2008  . Bupropion hcl  01/26/2008  . Ciprofloxacin  01/26/2008  . Clobetasol  05/31/2008  . Codeine  01/26/2008  . Escitalopram oxalate  05/31/2008  . Flagyl (metronidazole)  01/26/2008  . Fluoxetine hcl  05/31/2008  . Fluticasone-salmeterol  05/31/2008  . Furosemide  04/11/2009  . Immune globulins  03/08/2011  . Paroxetine  05/31/2008  . Penicillins  01/26/2008  . Statins  05/31/2008  . Sulfa antibiotics  08/04/2012  . Tacrolimus  05/31/2008  . Tetanus toxoid  01/26/2008  . Tuberculin purified protein derivative  01/26/2008    Family History  Problem Relation Age of Onset  . Alcohol abuse Mother   . Depression Mother   . Heart disease Mother   . Diabetes Mother   . Stroke Mother   . Diabetes Other     1/2 brother    History   Social History  . Marital Status: Divorced    Spouse Name: N/A    Number of Children: N/A  . Years of Education: N/A   Occupational History  . disabled    Social History Main Topics  . Smoking status: Current Every Day Smoker -- 0.50 packs/day    Types: Cigarettes  . Smokeless tobacco: Never  Used     Comment: PATIENT STATES HE QUIT SMOKING LAST WINTER   . Alcohol Use: No     Comment: daily  . Drug Use: No  . Sexually Active: No   Review of Systems: Occasional heartburn and epigastric pain. Positive for bilateral leg weakness. All other systems reviewed  and negative except where noted in HPI.  Physical Exam: Vital signs in last 24 hours: Temp:  [97.6 F (36.4 C)-97.7 F (36.5 C)] 97.6 F (36.4 C) (08/12 1415) Pulse Rate:  [61-70] 61 (08/12 1415) Resp:  [18] 18 (08/12 1415) BP: (116-133)/(62-71) 133/71 mmHg (08/12 1415) SpO2:  [95 %-98 %] 98 % (08/12 1415) Weight:  [177 lb 6.4 oz (80.468 kg)] 177 lb 6.4 oz (80.468 kg) (08/12 1145)  General:  well-developed, white male in NAD Head:  Normocephalic and atraumatic. Eyes:  Sclera clear, no icterus.   Conjunctiva pink. Ears:  Normal auditory acuity. Mouth:  No deformity or lesions.  Neck:  Supple; no masses . Lungs:  Clear throughout to auscultation.   No wheezes, crackles, or rhonchi. No acute distress. Heart:  Regular rate and rhythm Abdomen:  Soft, nondistended,. Diffuse bilateral lower quadrant tenderness. No masses, hepatomegaly. No obvious masses.  Normal bowel .    Msk:  Symmetrical without gross deformities.Bilateral lower extremity weakness (proximal). Pulses:  Normal pulses noted. Extremities:  Without edema. Neurologic:  Alert and  oriented x4;  grossly normal neurologically. Skin:  Intact without significant lesions or rashes. Ruddy facial skin Cervical Nodes:  No significant cervical adenopathy. Psych:  Alert and cooperative. Normal mood and affect.   Impression / Plan:  11.  62 year old male with dermatomyositis putting him at higher risk for colorectal cancer. He also has a risk of adenomatous colon polyps. Patient due for surveillance colonoscopy. He will be admitted to Observation and prepped today for am colonoscopy.  2.  Dyspepsia. For further evaluation patient will undergo EGD at time of colonoscopy  tomorrow.   3. Depression and anxiety. Patient scheduled to see psychiatrist this month  .  LOS: 0 days   Willette Cluster  09/26/2012, 2:55 PM  Attending MD note:   I have reviewed the above note, examined the patient and agree with plan of treatment. Pt is ambulatory with the wheelchair, prior colonoscopies were accomplished in in-hospital setting. Colonoscopy prep initiated, check electrolytes in am.  [facility] Pager # [phone]

## 2012-09-26 NOTE — Telephone Encounter (Signed)
Spoke with Patrick Holt patient is in [phone]. Willette Cluster, NP notified.

## 2012-09-26 NOTE — Progress Notes (Signed)
Arrived to place PIV. Patient persisted in manipulating his belongings and turning away. He finally asked me to leave his room after 5 minutes of attempting to get him "settled" so that I could assess. Primary RN notified to contact IV team again if the patient decided he was ready to have [place] look for a PIV.

## 2012-09-27 ENCOUNTER — Encounter (HOSPITAL_COMMUNITY): Payer: Self-pay

## 2012-09-27 ENCOUNTER — Encounter (HOSPITAL_COMMUNITY)
Admission: AD | Disposition: A | Discharge status: Home / Self Care | Payer: Self-pay | Source: Ambulatory Visit | Attending: Internal Medicine

## 2012-09-27 DIAGNOSIS — K222 Esophageal obstruction: Principal | ICD-10-CM

## 2012-09-27 DIAGNOSIS — D126 Benign neoplasm of colon, unspecified: Secondary | ICD-10-CM

## 2012-09-27 HISTORY — PX: COLONOSCOPY: SHX5424

## 2012-09-27 HISTORY — PX: ESOPHAGOGASTRODUODENOSCOPY: SHX5428

## 2012-09-27 LAB — BASIC METABOLIC PANEL
CO2: 32 mEq/L (ref 19–32)
Calcium: 9.2 mg/dL (ref 8.4–10.5)
GFR calc Af Amer: 89 mL/min — ABNORMAL LOW (ref >90)
Sodium: 143 mEq/L (ref 135–145)

## 2012-09-27 SURGERY — EGD (ESOPHAGOGASTRODUODENOSCOPY)
Anesthesia: Moderate Sedation

## 2012-09-27 MED ORDER — FENTANYL CITRATE 0.05 MG/ML IJ SOLN
INTRAMUSCULAR | Status: DC | PRN
  Administered 2012-09-27 (×4): 25 ug via INTRAVENOUS

## 2012-09-27 MED ORDER — PANTOPRAZOLE SODIUM 40 MG PO TBEC
40.0000 mg | DELAYED_RELEASE_TABLET | Freq: Once | ORAL | Status: AC
  Administered 2012-09-27: 40 mg via ORAL
  Filled 2012-09-27: qty 1

## 2012-09-27 MED ORDER — DIPHENHYDRAMINE HCL 50 MG/ML IJ SOLN
INTRAMUSCULAR | Status: DC | PRN
  Administered 2012-09-27: 25 mg via INTRAVENOUS

## 2012-09-27 MED ORDER — MIDAZOLAM HCL 10 MG/2ML IJ SOLN
INTRAMUSCULAR | Status: DC | PRN
  Administered 2012-09-27 (×3): 2 mg via INTRAVENOUS
  Administered 2012-09-27: 1 mg via INTRAVENOUS
  Administered 2012-09-27: 2 mg via INTRAVENOUS

## 2012-09-27 MED ORDER — IPRATROPIUM-ALBUTEROL 20-100 MCG/ACT IN AERS
1.0000 | INHALATION_SPRAY | Freq: Three times a day (TID) | RESPIRATORY_TRACT | Status: DC
  Filled 2012-09-27: qty 4

## 2012-09-27 MED ORDER — BUTAMBEN-TETRACAINE-BENZOCAINE 2-2-14 % EX AERO
INHALATION_SPRAY | CUTANEOUS | Status: DC | PRN
  Administered 2012-09-27: 2 via TOPICAL

## 2012-09-27 NOTE — Progress Notes (Signed)
Clinical Social Work  CSW informed RN that [facility] was at entrance to pick up patient. CSW is signing off but available if needed.  [place], [place] [phone]

## 2012-09-27 NOTE — Progress Notes (Signed)
Patient discharged per MD order. Discharge papers reviewed with patient, no prescriptions given. Pt wheeled downstairs by NT for transport home via [facility]. Angelena Form, RN

## 2012-09-27 NOTE — Op Note (Signed)
[facility] [address] [place], [address]   ENDOSCOPY PROCEDURE REPORT  PATIENT: Patrick Holt, Patrick Holt  MR#: [id number] BIRTHDATE: 05/30/1950 , 62  yrs. old GENDER: Male ENDOSCOPIST: Hart Carwin, MD REFERRED BY:  none PROCEDURE DATE:  09/27/2012 PROCEDURE:  EGD w/ biopsy  , Maloney dilation ASA CLASS:     Class II INDICATIONS:  Dysphagia.   Dyspepsia.   hx of dermatomyositis, esophageal dismotility. MEDICATIONS: These medications were titrated to patient response per physician's verbal order, Fentanyl 50 mcg IV, and Versed 5 mg IV TOPICAL ANESTHETIC: Cetacaine Spray  DESCRIPTION OF PROCEDURE: After the risks benefits and alternatives of the procedure were thoroughly explained, informed consent was obtained.  The PENTAX GASTOROSCOPE [id number] endoscope was introduced through the mouth and advanced to the second portion of the duodenum. Without limitations.  The instrument was slowly withdrawn as the mucosa was fully examined.        ESOPHAGUS: The mucosa of the esophagus appeared normal.   A stricture was found at the gastroesophageal junction.  The stenosis was traversable with the endoscope. It appeared benign, fibrous, 46 F Maloney dilator passed without blood on the dilator  ,  STOMACH: Mild gastritis (inflammation) was found on the anterior wall of the gastric antrum.  A biopsy was performed using cold forceps.  Sample obtained for helicobacter pylori testing. Gastric outlet was normal  DUODENUM: The duodenal mucosa showed no abnormalities in the bulb and second portion of the duodenum.  Retroflexed views revealed no abnormalities.     The scope was then withdrawn from the patient and the procedure completed.  COMPLICATIONS: There were no complications. ENDOSCOPIC IMPRESSION: 1.   The mucosa of the esophagus appeared normal 2.   Stricture was found at the gastroesophageal junction , passage of 36F Maloney dilator 3.   Gastritis (inflammation)  was found on the anterior wall of the gastric antrum; biopsy to r/o H.Pylori 4.   The duodenal mucosa showed no abnormalities in the bulb and second portion of the duodenum  RECOMMENDATIONS: 1.  Await pathology results 2.  Anti-reflux regimen to be follow 3.  Continue PPI  REPEAT EXAM: no recall  eSigned:  Hart Carwin, MD 09/27/2012 9:41 AM   CC:  PATIENT NAME:  Patrick Holt, Patrick Holt MR#: [id number]

## 2012-09-27 NOTE — Progress Notes (Signed)
Clinical Social Work Department BRIEF PSYCHOSOCIAL ASSESSMENT 09/27/2012  Patient:  Patrick Holt, Patrick Holt     Account Number:  [id number]     Admit date:  09/26/2012  Clinical Social Worker:  Dennison Bulla  Date/Time:  09/27/2012 12:30 PM  Referred by:  RN  Date Referred:  09/27/2012 Referred for  Transportation assistance   Other Referral:   Interview type:  Patient Other interview type:    PSYCHOSOCIAL DATA Living Status:  ALONE Admitted from facility:   Level of care:   Primary support name:  Jasmine December Primary support relationship to patient:  FRIEND Degree of support available:   Adequate    CURRENT CONCERNS Current Concerns  Other - See comment   Other Concerns:   Transportation home    SOCIAL WORK ASSESSMENT / PLAN CSW received referral from RN reporting that patient is ready to DC but needs assistance with transportation back home. CSW reviewed chart and met with patient at bedside.    CSW introduced myself and explained role. Patient reports that he scheduled [facility] to transport him to the hospital but that they need 24 hour notice so they will not pick him up today. CSW asked permission to contact [facility]. CSW spoke with Morrie Sheldon at [facility] who reports they will put patient on list. [facility] will call CSW once they are on their way to transport patient. CSW updated patient and RN on transportation plans.    CSW will continue to follow in order to keep RN updated on time of DC.   Assessment/plan status:  Psychosocial Support/Ongoing Assessment of Needs Other assessment/ plan:   Information/referral to community resources:   [facility] appointment scheduled    PATIENT'S/FAMILY'S RESPONSE TO PLAN OF CARE: Patient alert and oriented. Patient reports he is anxious to DC home and is glad that [facility] will come and pick him up. Patient thanked CSW for calling [facility].

## 2012-09-27 NOTE — Interval H&P Note (Signed)
History and Physical Interval Note:  09/27/2012 8:46 AM  Patrick Holt  has presented today for surgery, with the diagnosis of abdominal pain- no special needs  The various methods of treatment have been discussed with the patient and family. After consideration of risks, benefits and other options for treatment, the patient has consented to  Procedure(s): ESOPHAGOGASTRODUODENOSCOPY (EGD) (N/A) COLONOSCOPY (N/A) as a surgical intervention .  The patient's history has been reviewed, patient examined, no change in status, stable for surgery.  I have reviewed the patient's chart and labs.  Questions were answered to the patient's satisfaction.     Lina Sar

## 2012-09-27 NOTE — Progress Notes (Signed)
Pt has finished bowel prep as ordered. Last BM was running very watery with some blood in it, per pt. Pt reminded to not to eat or drink after 5am for EGD and colonoscopy today.

## 2012-09-27 NOTE — Discharge Summary (Signed)
[facility] Discharge Summary  Name: Patrick Holt MRN: [id number] DOB: 29-Nov-1950 61 y.o. PCP:  Gaspar Bidding, DO  Date of Admission: 09/26/2012 11:24 AM Date of Discharge: 09/27/2012 Primary Gastroenterologist: Lina Sar, MD Discharging Physician: Lina Sar, Md  Discharge Diagnosis: 1. History of adenomatous colon polyps  2. Upper abdominal pain.   3. Multiple medical problems including but not limited to CAD, atrial fibrillation, and  dermatomositis. Total CK this admission was normal at 150.  LDH normal at 148. No acute issues with his chronic medical problems this admission    4. Hypokalemia, noted after discharge.  Likely bowel prep related.   Consultations:none  GI Procedures:  EGD. Findings mild gastritis, biopsies pending. GEJ benign appearing stricture dilated with 80F Maloney  Colonoscopy. Findings: Diverticulosis and t6wo sessile polyps in ransverse and descending colon. Polyp path pending.  History/Physical Exam:  See Admission H&P  Admission HPI: Patrick Holt is a 62 y.o. male with multiple medical problems. He has dermatomyositis for many years and at one point was wheelchair-bound. Patient known to Dr. Juanda Chance for history of GERD, diverticulitis and adenomatous colon polyps. He gets colorectal screening every 5 years because of increased incidence of colorectal cancer in dermatomyositis. He had several colonoscopies and has a history of adenomatous polyps in 2005 and most recently in 2009. Patient saw Dr. Juanda Chance 09/05/12 with complaints of dyspepsia / epigastric discomfort.Marland Kitchen EGD was planned for further evaluation. He is due for colonoscopy but because of concern about his ability to prep as well as find transportation the decision was made to admit patient to Observation for prep and colonoscopy.   Hospital Course by problem list: 1. History of colon polyps. Patient has limited mobility at home. He reported difficulty with getting transportation  to endoscopic procedures so decision was made to admit patient prior to colonoscopy. Patient admitted to medical bed. His home medications were continued. Patient began bowel prep on evening of admission. The following morning he underwent colonoscopy with findings as noted above. Procedure was uneventful. Patient was discharged home a few hours after the procedure. He was transported home by [facility].   2. Upper abdominal pain. For further evaluation patient underwent EGD at time of colonoscopy. Other than gastritis he had a GEJ stricture which was dilated. Gastric biopsies pending at time of discharge.  3. Hypokalemia, noted after discharge. Will call in 40 mEq of potassium x 3 days and then have patient come for repeat K+ level Monday morning.    Discharge Vitals:  BP 127/66  Pulse 67  Temp(Src) 97.6 F (36.4 C) (Oral)  Resp 10  Ht 5\' 6"  (1.676 m)  Wt 190 lb (86.183 kg)  BMI 30.68 kg/m2  SpO2 96%  Discharge Labs:  Results for orders placed during the hospital encounter of 09/26/12 (from the past 24 hour(s))  COMPREHENSIVE METABOLIC PANEL     Status: Abnormal   Collection Time    09/26/12  5:47 PM      Result Value Range   Sodium 141  135 - 145 mEq/L   Potassium 2.9 (*) 3.5 - 5.1 mEq/L   Chloride 102  96 - 112 mEq/L   CO2 33 (*) 19 - 32 mEq/L   Glucose, Bld 85  70 - 99 mg/dL   BUN 8  6 - 23 mg/dL   Creatinine, Ser 4.09  0.50 - 1.35 mg/dL   Calcium 8.7  8.4 - 81.1 mg/dL   Total Protein 6.3  6.0 - 8.3 g/dL   Albumin 3.4 (*)  3.5 - 5.2 g/dL   AST 13  0 - 37 U/L   ALT 9  0 - 53 U/L   Alkaline Phosphatase 87  39 - 117 U/L   Total Bilirubin 0.6  0.3 - 1.2 mg/dL   GFR calc non Af Amer 89 (*) >90 mL/min   GFR calc Af Amer >90  >90 mL/min  CK TOTAL AND CKMB     Status: Abnormal   Collection Time    09/26/12  5:47 PM      Result Value Range   Total CK 150  7 - 232 U/L   CK, MB 4.2 (*) 0.3 - 4.0 ng/mL   Relative Index 2.8 (*) 0.0 - 2.5  LACTATE DEHYDROGENASE     Status: None    Collection Time    09/26/12  5:47 PM      Result Value Range   LDH 148  94 - 250 U/L  BASIC METABOLIC PANEL     Status: Abnormal   Collection Time    09/27/12  5:06 AM      Result Value Range   Sodium 143  135 - 145 mEq/L   Potassium 2.8 (*) 3.5 - 5.1 mEq/L   Chloride 104  96 - 112 mEq/L   CO2 32  19 - 32 mEq/L   Glucose, Bld 87  70 - 99 mg/dL   BUN 8  6 - 23 mg/dL   Creatinine, Ser 4.69  0.50 - 1.35 mg/dL   Calcium 9.2  8.4 - 62.9 mg/dL   GFR calc non Af Amer 77 (*) >90 mL/min   GFR calc Af Amer 89 (*) >90 mL/min    Disposition and follow-up:   Mr.Bracy W Bristol was discharged from [facility] in stable condition.    Follow-up Appointments: Discharge Orders   Future Appointments Provider Department Dept Phone   10/06/2012 11:00 AM Andrena Mews, DO [facility] [phone]   Future Orders Complete By Expires   Resume previous diet  As directed       Discharge Medications:   Medication List         ALPRAZolam 1 MG tablet  Commonly known as:  XANAX  Take 1 tablet (1 mg total) by mouth 3 (three) times daily as needed for sleep or anxiety.     amiodarone 200 MG tablet  Commonly known as:  PACERONE  Take 1 tablet (200 mg total) by mouth daily before breakfast.     aspirin EC 81 MG tablet  Take 81 mg by mouth daily.     collagenase ointment  Commonly known as:  SANTYL  Apply 1 application topically daily.     esomeprazole 40 MG capsule  Commonly known as:  NEXIUM  Take 1 capsule (40 mg total) by mouth daily before breakfast.     Ipratropium-Albuterol 20-100 MCG/ACT Aers respimat  Commonly known as:  COMBIVENT  Inhale 1 puff into the lungs every 6 (six) hours.     methotrexate 2.5 MG tablet  Commonly known as:  RHEUMATREX  2.5 mg See admin instructions. 3 times a week     nitroGLYCERIN 0.4 MG SL tablet  Commonly known as:  NITROSTAT  Place 0.4 mg under the tongue every 5 (five) minutes as needed. For chest pain      oxybutynin 5 MG tablet  Commonly known as:  DITROPAN  Take 1 tablet (5 mg total) by mouth 3 (three) times daily.     rOPINIRole 2 MG  tablet  Commonly known as:  REQUIP  Take 1 tablet (2 mg total) by mouth 3 (three) times daily.     torsemide 20 MG tablet  Commonly known as:  DEMADEX  Take 1 tablet (20 mg total) by mouth daily.        Signed: Willette Cluster 09/27/2012, 1:03 PM

## 2012-09-27 NOTE — Op Note (Signed)
[facility] [address] [place], [address]   COLONOSCOPY PROCEDURE REPORT  PATIENT: Patrick Holt, Patrick Holt  MR#: [id number] BIRTHDATE: 1950/03/06 , 62  yrs. old GENDER: Male ENDOSCOPIST: Hart Carwin, MD REFERRED BY:  none PROCEDURE DATE:  09/27/2012 PROCEDURE:   Colonoscopy with snare polypectomy and Colonoscopy with cold biopsy polypectomy ASA CLASS:   Class II INDICATIONS: dermatomyositis, last colon 2007, hx adenomatous polyp, rectal bleeding MEDICATIONS: These medications were titrated to patient response per physician's verbal order, Fentanyl 50 mcg IV, Benadryl 25 mg IV, and Versed 2 mg IV  DESCRIPTION OF PROCEDURE:   After the risks and benefits and of the procedure were explained, informed consent was obtained.  A digital rectal exam revealed no abnormalities of the rectum.    The Pentax Ped Colon [id number]  endoscope was introduced through the anus and advanced to the cecum, which was identified by both the appendix and ileocecal valve .  The quality of the prep was excellent, using MoviPrep .  The instrument was then slowly withdrawn as the colon was fully examined.     COLON FINDINGS: Two smooth sessile polyps ranging between 3-6mm in size were found in the transverse colon.  A polypectomy was performed with a cold snare and with cold forceps.  The resection was complete and the polyp tissue was completely retrieved.   There was moderate diverticulosis noted throughout the entire examined colon with associated muscular hypertrophy and angulation. Retroflexed views revealed no abnormalities.     The scope was then withdrawn from the patient and the procedure completed.  COMPLICATIONS: There were no complications. ENDOSCOPIC IMPRESSION: 1.   Two sessile polyps ranging between 3-46mm in size were found in the transverse colon and descending , polypectomy was performed with a cold snare and with cold forceps 2.   There was moderate diverticulosis  noted throughout the entire examined colon  RECOMMENDATIONS: 1.  Await pathology results 2.  High fiber diet 3. Bentyl 10 mg po tid ac   REPEAT EXAM: In 5 year(s)  for Colonoscopy.  cc:  _______________________________ eSignedHart Carwin, MD 09/27/2012 9:51 AM     PATIENT NAME:  Patrick Holt, Patrick Holt MR#: [id number]

## 2012-09-28 ENCOUNTER — Telehealth: Payer: Self-pay | Admitting: *Deleted

## 2012-09-28 ENCOUNTER — Encounter (HOSPITAL_COMMUNITY): Payer: Self-pay | Admitting: Internal Medicine

## 2012-09-28 DIAGNOSIS — E876 Hypokalemia: Secondary | ICD-10-CM

## 2012-09-28 NOTE — Telephone Encounter (Signed)
Per Willette Cluster, NP , rx for Kdur 40 Meq daily x 3 days. Recheck potassium level on Monday. Spoke with patient and he states he has no money for the rx. He states he has bananas at home and will eat them. He will come for labs on Monday. Notified Willette Cluster, NP.

## 2012-09-30 ENCOUNTER — Encounter: Payer: Self-pay | Admitting: Internal Medicine

## 2012-10-02 ENCOUNTER — Telehealth: Payer: Self-pay | Admitting: *Deleted

## 2012-10-02 ENCOUNTER — Other Ambulatory Visit (INDEPENDENT_AMBULATORY_CARE_PROVIDER_SITE_OTHER): Payer: Medicare HMO

## 2012-10-02 DIAGNOSIS — E876 Hypokalemia: Secondary | ICD-10-CM

## 2012-10-02 MED ORDER — POTASSIUM CHLORIDE CRYS ER 20 MEQ PO TBCR: EXTENDED_RELEASE_TABLET | ORAL | Status: DC

## 2012-10-02 NOTE — Telephone Encounter (Signed)
Per Willette Cluster, NP patient needs KDur 40 Meq daily x 5 days, hold Demadex x 3 days. Watch salt intake. Needs potassium recheck on Friday. Spoke with patient and he does not have the $ for rx or a ride to get it . Rx sent to [facility] for delivery. Patient states he does not have a ride to get labs checked on Friday. He states he will be seeing [name] on Friday. Would like labs drawn then. Left a message for Dr. Janeece Riggers office to call me.

## 2012-10-03 ENCOUNTER — Telehealth: Payer: Self-pay | Admitting: *Deleted

## 2012-10-03 DIAGNOSIS — M3390 Dermatopolymyositis, unspecified, organ involvement unspecified: Secondary | ICD-10-CM

## 2012-10-03 DIAGNOSIS — E876 Hypokalemia: Secondary | ICD-10-CM

## 2012-10-03 NOTE — Telephone Encounter (Signed)
Left a message for Patrick Holt at [phone] at [facility] to call me.

## 2012-10-03 NOTE — Telephone Encounter (Signed)
Spoke with Molly Maduro at Dr. Janeece Riggers office. Gave him the information re: patient's potassium level and our interventions. He will let [name] know and have potassium followed up on Friday at [facility] with [name].

## 2012-10-03 NOTE — Addendum Note (Signed)
Addended by: Gaspar Bidding D on: 10/03/2012 10:36 AM   Modules accepted: Orders

## 2012-10-03 NOTE — Telephone Encounter (Signed)
Future orders placed.  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

## 2012-10-03 NOTE — Telephone Encounter (Signed)
Received a call from [facility], Mr Owens Shark potassium was low during his colonoscopy. They prescribed him tabs to take for 5 days and to hold the Demadex for 3 days. He needs to have his level checked here on Friday at his follow up visit with you. Please put in future order.Nance Mccombs, Rodena Medin

## 2012-10-04 ENCOUNTER — Ambulatory Visit (INDEPENDENT_AMBULATORY_CARE_PROVIDER_SITE_OTHER): Payer: Medicare HMO | Admitting: Psychiatry

## 2012-10-04 ENCOUNTER — Encounter (HOSPITAL_COMMUNITY): Payer: Self-pay | Admitting: Psychiatry

## 2012-10-04 VITALS — BP 137/78 | HR 63 | Ht 64.0 in | Wt 208.0 lb

## 2012-10-04 DIAGNOSIS — F329 Major depressive disorder, single episode, unspecified: Secondary | ICD-10-CM

## 2012-10-04 DIAGNOSIS — F39 Unspecified mood [affective] disorder: Secondary | ICD-10-CM

## 2012-10-04 DIAGNOSIS — F09 Unspecified mental disorder due to known physiological condition: Secondary | ICD-10-CM

## 2012-10-04 DIAGNOSIS — F0630 Mood disorder due to known physiological condition, unspecified: Secondary | ICD-10-CM
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
## 2012-10-06 ENCOUNTER — Encounter: Payer: Self-pay | Admitting: Sports Medicine

## 2012-10-06 ENCOUNTER — Ambulatory Visit (INDEPENDENT_AMBULATORY_CARE_PROVIDER_SITE_OTHER): Payer: Medicare HMO | Admitting: Sports Medicine

## 2012-10-06 VITALS — BP 132/85 | HR 68 | Temp 98.6°F | Ht 64.0 in | Wt 206.0 lb

## 2012-10-06 DIAGNOSIS — I10 Essential (primary) hypertension: Secondary | ICD-10-CM

## 2012-10-06 DIAGNOSIS — F329 Major depressive disorder, single episode, unspecified: Secondary | ICD-10-CM

## 2012-10-06 DIAGNOSIS — E876 Hypokalemia: Secondary | ICD-10-CM

## 2012-10-06 DIAGNOSIS — F1027 Alcohol dependence with alcohol-induced persisting dementia: Secondary | ICD-10-CM

## 2012-10-06 DIAGNOSIS — R52 Pain, unspecified: Secondary | ICD-10-CM

## 2012-10-06 DIAGNOSIS — M3390 Dermatopolymyositis, unspecified, organ involvement unspecified: Secondary | ICD-10-CM

## 2012-10-06 MED ORDER — ALPRAZOLAM 1 MG PO TABS: 1.0000 mg | ORAL_TABLET | Freq: Three times a day (TID) | ORAL | Status: DC | PRN

## 2012-10-06 NOTE — Progress Notes (Signed)
  [facility]  Patient name: Patrick Holt MRN [id number]  Date of birth: [birth year]  CC & HPI:  Patrick Holt is a 62 y.o. male presenting to clinic.  Chief Complaint  Patient presents with  . Follow-up   patient presents today to followup his multiple chronic medical problems.  He was recently discharged from the hospital following a colonoscopy and was incidentally found to be hypokalemic.  He is requesting labs rechecked today in his insisting on going back on his methotrexate.  He was additionally seen by psychiatry yesterday who was unclear of the exact reason for referral and given Patrick Holt's significant difficulties with history - albeit sometimes incredibly clear recall of associated details - did not make any medication recommendations at this time.  Pt denies chest pain, dyspnea at rest or exertion, PND, lower extremity edema..  Patient denies any facial asymmetry, unilateral weakness, or dysarthria.   Patrick Holt does endorse continuing to feel overwhelmed but he is aggravated by having to see so many specialists.  He does not understand why primary care can take care of all of his problems.   ROS:  PER HPI  Pertinent History Reviewed:  Medical & Surgical Hx:  Reviewed: Significant for dermatomyositis, thought disorder, prior hospitalizations for psychiatric breaks and December of 2013 in January 2014.  Significant gastrointestinal disorders including durable bowel syndrome but with recent negative colonoscopy.  He does state that if he stopped smoking he will die from stopping smoking and has been told this by multiple providers.  There is some question as to whether he has a alcohol dependence however on questioning he has never elicited this. Medications: Reviewed & Updated - see associated section Social History: Reviewed -  reports that he has been smoking Cigarettes.  He has been smoking about 0.50 packs per day. He has never used smokeless  tobacco.  Objective Findings:  Vitals: BP 132/85  Pulse 68  Temp(Src) 98.6 F (37 C) (Oral)  Ht 5\' 4"  (1.626 m)  Wt 206 lb (93.441 kg)  BMI 35.34 kg/m2 PE: GENERAL:  adult elderly appearing male. In no discomfort; no respiratory distress Walks hunched over with a walker.    PSYCH:  alert and interactive, tangential and disorganized thoughts, fluid speech , poor insight   HNEENT:    CARDIO:  RRR, S1/S2 heard, no murmur  LUNGS:  CTA B, no wheezes, no crackles    Assessment & Plan:   1. Hypokalemia   2. Dermatomyositis    See problem associated charting

## 2012-10-07 LAB — COMPREHENSIVE METABOLIC PANEL WITH GFR
ALT: 11 U/L (ref 0–53)
AST: 15 U/L (ref 0–37)
Albumin: 3.7 g/dL (ref 3.5–5.2)
Alkaline Phosphatase: 85 U/L (ref 39–117)
BUN: 10 mg/dL (ref 6–23)
CO2: 28 meq/L (ref 19–32)
Calcium: 9.1 mg/dL (ref 8.4–10.5)
Chloride: 106 meq/L (ref 96–112)
Creat: 0.9 mg/dL (ref 0.50–1.35)
Glucose, Bld: 103 mg/dL — ABNORMAL HIGH (ref 70–99)
Potassium: 4 meq/L (ref 3.5–5.3)
Sodium: 142 meq/L (ref 135–145)
Total Bilirubin: 0.8 mg/dL (ref 0.3–1.2)
Total Protein: 6.4 g/dL (ref 6.0–8.3)

## 2012-10-07 LAB — CBC
MCHC: 32.6 g/dL (ref 30.0–36.0)
Platelets: 304 10*3/uL (ref 150–400)
RDW: 17.5 % — ABNORMAL HIGH (ref 11.5–15.5)
WBC: 5.5 10*3/uL (ref 4.0–10.5)

## 2012-10-07 LAB — CK: Total CK: 142 U/L (ref 7–232)

## 2012-10-09 ENCOUNTER — Other Ambulatory Visit: Payer: Self-pay | Admitting: *Deleted

## 2012-10-09 ENCOUNTER — Telehealth: Payer: Self-pay | Admitting: Internal Medicine

## 2012-10-09 NOTE — Telephone Encounter (Signed)
New prob  Pt is concerned because he saw his PCP(Dr Berline Chough) and he was told he did not have a stroke before. Per pt when he saw Tereso Newcomer in Feb and saw Dr Tenny Craw in May he was told that he had a stroke. Pt said his PCP said he did not see anywhere in his records that he had a stroke.  Patient would like some clarification.

## 2012-10-09 NOTE — Telephone Encounter (Signed)
Left message to call back  

## 2012-10-10 NOTE — Telephone Encounter (Signed)
Follow up  Pt is returning call from yesterday. He would like to speak with Tereso Newcomer if at all possible.

## 2012-10-10 NOTE — Telephone Encounter (Signed)
Pt. Called because he wants to verify that he did not have a stroke; because his PCP [name] said that according to pt's records he did not have one. I reviewed  Tereso Newcomer PA and Dr. Tenny Craw O/V notes, which states that pt had a fall , an MRI was done which it showed that pt had a subarachnoid vs subdural hematoma. I explained to pt the difference of what he had, and a stroke. After much repeating pt finally understood. Pt also asked if he needed to continued taking Amiodarone 200 mg once daily, which my reply was "definitely yes". Pt Verbalized understanding.

## 2012-10-11 ENCOUNTER — Other Ambulatory Visit: Payer: Self-pay | Admitting: *Deleted

## 2012-10-11 MED ORDER — METHOTREXATE 2.5 MG PO TABS: 2.5000 mg | ORAL_TABLET | ORAL | Status: DC

## 2012-10-11 NOTE — Telephone Encounter (Signed)
Pt needs a follow up appointment ASAP to further assess labs

## 2012-10-12 ENCOUNTER — Telehealth: Payer: Self-pay | Admitting: Sports Medicine

## 2012-10-12 NOTE — Telephone Encounter (Signed)
Pt would like lab results.  

## 2012-10-12 NOTE — Telephone Encounter (Signed)
Please advise and I will be happy to call if needed. Wyatt Haste, RN-BSN

## 2012-10-12 NOTE — Telephone Encounter (Signed)
Pt states that he had labs done on 8/22 and was in the hospital on 8/12 - is it still necessary? Please advise and he would like for you to call him regarding several issues. Wyatt Haste, RN-BSN

## 2012-10-16 ENCOUNTER — Emergency Department (HOSPITAL_COMMUNITY): Payer: Medicare HMO

## 2012-10-16 ENCOUNTER — Encounter (HOSPITAL_COMMUNITY): Payer: Self-pay | Admitting: Neurology

## 2012-10-16 ENCOUNTER — Inpatient Hospital Stay (HOSPITAL_COMMUNITY)
Admission: EM | Admit: 2012-10-16 | Discharge: 2012-10-20 | DRG: 885 | Disposition: A | Discharge status: Skilled Nursing Facility | Payer: Medicare HMO | Attending: Family Medicine | Admitting: Family Medicine

## 2012-10-16 DIAGNOSIS — E785 Hyperlipidemia, unspecified: Secondary | ICD-10-CM | POA: Diagnosis present

## 2012-10-16 DIAGNOSIS — G40909 Epilepsy, unspecified, not intractable, without status epilepticus: Secondary | ICD-10-CM | POA: Diagnosis present

## 2012-10-16 DIAGNOSIS — F429 Obsessive-compulsive disorder, unspecified: Secondary | ICD-10-CM | POA: Diagnosis present

## 2012-10-16 DIAGNOSIS — I480 Paroxysmal atrial fibrillation: Secondary | ICD-10-CM | POA: Diagnosis not present

## 2012-10-16 DIAGNOSIS — M3390 Dermatopolymyositis, unspecified, organ involvement unspecified: Secondary | ICD-10-CM

## 2012-10-16 DIAGNOSIS — F329 Major depressive disorder, single episode, unspecified: Principal | ICD-10-CM | POA: Diagnosis present

## 2012-10-16 DIAGNOSIS — N3941 Urge incontinence: Secondary | ICD-10-CM | POA: Diagnosis present

## 2012-10-16 DIAGNOSIS — F17200 Nicotine dependence, unspecified, uncomplicated: Secondary | ICD-10-CM

## 2012-10-16 DIAGNOSIS — Z6832 Body mass index (BMI) 32.0-32.9, adult: Secondary | ICD-10-CM

## 2012-10-16 DIAGNOSIS — M069 Rheumatoid arthritis, unspecified: Secondary | ICD-10-CM | POA: Diagnosis present

## 2012-10-16 DIAGNOSIS — J449 Chronic obstructive pulmonary disease, unspecified: Secondary | ICD-10-CM | POA: Diagnosis present

## 2012-10-16 DIAGNOSIS — G609 Hereditary and idiopathic neuropathy, unspecified: Secondary | ICD-10-CM | POA: Diagnosis present

## 2012-10-16 DIAGNOSIS — F1920 Other psychoactive substance dependence, uncomplicated: Secondary | ICD-10-CM | POA: Diagnosis present

## 2012-10-16 DIAGNOSIS — Z9119 Patient's noncompliance with other medical treatment and regimen: Secondary | ICD-10-CM

## 2012-10-16 DIAGNOSIS — I10 Essential (primary) hypertension: Secondary | ICD-10-CM | POA: Diagnosis present

## 2012-10-16 DIAGNOSIS — E119 Type 2 diabetes mellitus without complications: Secondary | ICD-10-CM | POA: Diagnosis present

## 2012-10-16 DIAGNOSIS — G9349 Other encephalopathy: Secondary | ICD-10-CM | POA: Diagnosis present

## 2012-10-16 DIAGNOSIS — R52 Pain, unspecified: Secondary | ICD-10-CM

## 2012-10-16 DIAGNOSIS — F319 Bipolar disorder, unspecified: Secondary | ICD-10-CM

## 2012-10-16 DIAGNOSIS — F341 Dysthymic disorder: Secondary | ICD-10-CM | POA: Diagnosis present

## 2012-10-16 DIAGNOSIS — F411 Generalized anxiety disorder: Secondary | ICD-10-CM

## 2012-10-16 DIAGNOSIS — I7300 Raynaud's syndrome without gangrene: Secondary | ICD-10-CM | POA: Diagnosis present

## 2012-10-16 DIAGNOSIS — I4891 Unspecified atrial fibrillation: Secondary | ICD-10-CM | POA: Diagnosis present

## 2012-10-16 DIAGNOSIS — Z609 Problem related to social environment, unspecified: Secondary | ICD-10-CM

## 2012-10-16 DIAGNOSIS — E669 Obesity, unspecified: Secondary | ICD-10-CM | POA: Diagnosis present

## 2012-10-16 DIAGNOSIS — Z8669 Personal history of other diseases of the nervous system and sense organs: Secondary | ICD-10-CM

## 2012-10-16 DIAGNOSIS — J9819 Other pulmonary collapse: Secondary | ICD-10-CM | POA: Diagnosis present

## 2012-10-16 DIAGNOSIS — J4489 Other specified chronic obstructive pulmonary disease: Secondary | ICD-10-CM | POA: Diagnosis present

## 2012-10-16 DIAGNOSIS — E876 Hypokalemia: Secondary | ICD-10-CM | POA: Diagnosis present

## 2012-10-16 DIAGNOSIS — N179 Acute kidney failure, unspecified: Secondary | ICD-10-CM | POA: Diagnosis present

## 2012-10-16 DIAGNOSIS — K219 Gastro-esophageal reflux disease without esophagitis: Secondary | ICD-10-CM | POA: Diagnosis present

## 2012-10-16 DIAGNOSIS — R159 Full incontinence of feces: Secondary | ICD-10-CM | POA: Diagnosis present

## 2012-10-16 DIAGNOSIS — I252 Old myocardial infarction: Secondary | ICD-10-CM

## 2012-10-16 DIAGNOSIS — W19XXXA Unspecified fall, initial encounter: Secondary | ICD-10-CM | POA: Diagnosis present

## 2012-10-16 DIAGNOSIS — I259 Chronic ischemic heart disease, unspecified: Secondary | ICD-10-CM

## 2012-10-16 DIAGNOSIS — Z91199 Patient's noncompliance with other medical treatment and regimen due to unspecified reason: Secondary | ICD-10-CM

## 2012-10-16 DIAGNOSIS — R4182 Altered mental status, unspecified: Secondary | ICD-10-CM

## 2012-10-16 DIAGNOSIS — Z79899 Other long term (current) drug therapy: Secondary | ICD-10-CM

## 2012-10-16 DIAGNOSIS — F309 Manic episode, unspecified: Secondary | ICD-10-CM | POA: Diagnosis present

## 2012-10-16 DIAGNOSIS — F29 Unspecified psychosis not due to a substance or known physiological condition: Secondary | ICD-10-CM

## 2012-10-16 DIAGNOSIS — M6282 Rhabdomyolysis: Secondary | ICD-10-CM | POA: Diagnosis present

## 2012-10-16 DIAGNOSIS — F1027 Alcohol dependence with alcohol-induced persisting dementia: Secondary | ICD-10-CM

## 2012-10-16 DIAGNOSIS — K030 Excessive attrition of teeth: Secondary | ICD-10-CM

## 2012-10-16 DIAGNOSIS — I2510 Atherosclerotic heart disease of native coronary artery without angina pectoris: Secondary | ICD-10-CM | POA: Diagnosis present

## 2012-10-16 LAB — CBC WITH DIFFERENTIAL/PLATELET
Basophils Relative: 0 % (ref 0–1)
Eosinophils Absolute: 0 10*3/uL (ref 0.0–0.7)
Eosinophils Relative: 0 % (ref 0–5)
MCH: 31.4 pg (ref 26.0–34.0)
MCHC: 34.2 g/dL (ref 30.0–36.0)
MCV: 91.8 fL (ref 78.0–100.0)
Neutrophils Relative %: 82 % — ABNORMAL HIGH (ref 43–77)
Platelets: 385 10*3/uL (ref 150–400)

## 2012-10-16 LAB — RAPID URINE DRUG SCREEN, HOSP PERFORMED
Amphetamines: NOT DETECTED
Barbiturates: NOT DETECTED
Benzodiazepines: NOT DETECTED
Cocaine: NOT DETECTED
Tetrahydrocannabinol: NOT DETECTED

## 2012-10-16 LAB — COMPREHENSIVE METABOLIC PANEL
Albumin: 4.1 g/dL (ref 3.5–5.2)
Alkaline Phosphatase: 93 U/L (ref 39–117)
BUN: 41 mg/dL — ABNORMAL HIGH (ref 6–23)
Calcium: 9.6 mg/dL (ref 8.4–10.5)
Potassium: 3 mEq/L — ABNORMAL LOW (ref 3.5–5.1)
Sodium: 142 mEq/L (ref 135–145)
Total Protein: 8 g/dL (ref 6.0–8.3)

## 2012-10-16 LAB — URINE MICROSCOPIC-ADD ON

## 2012-10-16 LAB — POCT I-STAT, CHEM 8
BUN: 40 mg/dL — ABNORMAL HIGH (ref 6–23)
Calcium, Ion: 1.12 mmol/L — ABNORMAL LOW (ref 1.13–1.30)
HCT: 55 % — ABNORMAL HIGH (ref 39.0–52.0)
Hemoglobin: 18.7 g/dL — ABNORMAL HIGH (ref 13.0–17.0)
Sodium: 145 mEq/L (ref 135–145)
TCO2: 26 mmol/L (ref 0–100)

## 2012-10-16 LAB — ETHANOL: Alcohol, Ethyl (B): <11 mg/dL (ref 0–11)

## 2012-10-16 LAB — CREATININE, SERUM: GFR calc Af Amer: 68 mL/min — ABNORMAL LOW (ref >90)

## 2012-10-16 LAB — URINALYSIS, ROUTINE W REFLEX MICROSCOPIC
Glucose, UA: NEGATIVE mg/dL
Specific Gravity, Urine: 1.023 (ref 1.005–1.030)
pH: 5.5 (ref 5.0–8.0)

## 2012-10-16 LAB — CBC
HCT: 46.3 % (ref 39.0–52.0)
MCV: 92 fL (ref 78.0–100.0)
RDW: 17.3 % — ABNORMAL HIGH (ref 11.5–15.5)
WBC: 14.5 10*3/uL — ABNORMAL HIGH (ref 4.0–10.5)

## 2012-10-16 LAB — CG4 I-STAT (LACTIC ACID): Lactic Acid, Venous: 1.59 mmol/L (ref 0.5–2.2)

## 2012-10-16 LAB — SALICYLATE LEVEL: Salicylate Lvl: <2 mg/dL — ABNORMAL LOW (ref 2.8–20.0)

## 2012-10-16 LAB — TROPONIN I
Troponin I: <0.3 ng/mL (ref <0.30)
Troponin I: <0.3 ng/mL (ref <0.30)

## 2012-10-16 MED ORDER — LORAZEPAM 2 MG/ML IJ SOLN
0.5000 mg | Freq: Three times a day (TID) | INTRAMUSCULAR | Status: DC
  Administered 2012-10-17: 0.5 mg via INTRAVENOUS
  Filled 2012-10-16: qty 1

## 2012-10-16 MED ORDER — FOLIC ACID 1 MG PO TABS: 1.0000 mg | ORAL_TABLET | Freq: Every day | ORAL | Status: DC

## 2012-10-16 MED ORDER — THIAMINE HCL 100 MG/ML IJ SOLN
100.0000 mg | Freq: Every day | INTRAMUSCULAR | Status: DC
  Filled 2012-10-16 (×4): qty 1

## 2012-10-16 MED ORDER — PANTOPRAZOLE SODIUM 40 MG PO TBEC
40.0000 mg | DELAYED_RELEASE_TABLET | Freq: Every day | ORAL | Status: DC
  Administered 2012-10-17 – 2012-10-20 (×4): 40 mg via ORAL
  Filled 2012-10-16 (×4): qty 1

## 2012-10-16 MED ORDER — VANCOMYCIN HCL IN DEXTROSE 750-5 MG/150ML-% IV SOLN
750.0000 mg | Freq: Two times a day (BID) | INTRAVENOUS | Status: DC
  Administered 2012-10-17 – 2012-10-19 (×5): 750 mg via INTRAVENOUS
  Filled 2012-10-16 (×8): qty 150

## 2012-10-16 MED ORDER — AMIODARONE HCL 200 MG PO TABS
200.0000 mg | ORAL_TABLET | Freq: Every day | ORAL | Status: DC
  Administered 2012-10-17 – 2012-10-20 (×4): 200 mg via ORAL
  Filled 2012-10-16 (×7): qty 1

## 2012-10-16 MED ORDER — NICOTINE 14 MG/24HR TD PT24
14.0000 mg | MEDICATED_PATCH | Freq: Every day | TRANSDERMAL | Status: DC | PRN
  Filled 2012-10-16: qty 1

## 2012-10-16 MED ORDER — DEXTROSE 5 % IV SOLN
1.0000 g | Freq: Once | INTRAVENOUS | Status: AC
  Administered 2012-10-16: 1 g via INTRAVENOUS
  Filled 2012-10-16: qty 1

## 2012-10-16 MED ORDER — ASPIRIN EC 81 MG PO TBEC
81.0000 mg | DELAYED_RELEASE_TABLET | Freq: Every day | ORAL | Status: DC
  Administered 2012-10-17 – 2012-10-20 (×4): 81 mg via ORAL
  Filled 2012-10-16 (×5): qty 1

## 2012-10-16 MED ORDER — LORAZEPAM 1 MG PO TABS
1.0000 mg | ORAL_TABLET | Freq: Four times a day (QID) | ORAL | Status: AC | PRN
  Administered 2012-10-18 (×3): 1 mg via ORAL
  Filled 2012-10-16 (×3): qty 1

## 2012-10-16 MED ORDER — ADULT MULTIVITAMIN W/MINERALS CH
1.0000 | ORAL_TABLET | Freq: Every day | ORAL | Status: DC
  Administered 2012-10-17 – 2012-10-20 (×4): 1 via ORAL
  Filled 2012-10-16 (×4): qty 1

## 2012-10-16 MED ORDER — DEXTROSE 5 % IV SOLN
1.0000 g | Freq: Three times a day (TID) | INTRAVENOUS | Status: DC
  Administered 2012-10-17 – 2012-10-19 (×7): 1 g via INTRAVENOUS
  Filled 2012-10-16 (×10): qty 1

## 2012-10-16 MED ORDER — HEPARIN SODIUM (PORCINE) 5000 UNIT/ML IJ SOLN
5000.0000 [IU] | Freq: Three times a day (TID) | INTRAMUSCULAR | Status: DC
  Administered 2012-10-16 – 2012-10-20 (×11): 5000 [IU] via SUBCUTANEOUS
  Filled 2012-10-16 (×14): qty 1

## 2012-10-16 MED ORDER — DEXTROSE 5 % IV SOLN: 1.0000 g | Freq: Two times a day (BID) | INTRAVENOUS | Status: DC

## 2012-10-16 MED ORDER — FOLIC ACID 1 MG PO TABS
1.0000 mg | ORAL_TABLET | Freq: Every day | ORAL | Status: DC
  Administered 2012-10-17 – 2012-10-20 (×4): 1 mg via ORAL
  Filled 2012-10-16 (×5): qty 1

## 2012-10-16 MED ORDER — VANCOMYCIN HCL IN DEXTROSE 1-5 GM/200ML-% IV SOLN
1000.0000 mg | Freq: Once | INTRAVENOUS | Status: AC
  Administered 2012-10-16: 1000 mg via INTRAVENOUS
  Filled 2012-10-16: qty 200

## 2012-10-16 MED ORDER — VITAMIN B-1 100 MG PO TABS
100.0000 mg | ORAL_TABLET | Freq: Every day | ORAL | Status: DC
  Administered 2012-10-17 – 2012-10-20 (×4): 100 mg via ORAL
  Filled 2012-10-16 (×4): qty 1

## 2012-10-16 MED ORDER — LORAZEPAM 2 MG/ML IJ SOLN
1.0000 mg | Freq: Four times a day (QID) | INTRAMUSCULAR | Status: AC | PRN
  Administered 2012-10-17 (×3): 1 mg via INTRAVENOUS
  Filled 2012-10-16 (×3): qty 1

## 2012-10-16 MED ORDER — SODIUM CHLORIDE 0.9 % IV BOLUS (SEPSIS)
1000.0000 mL | Freq: Once | INTRAVENOUS | Status: AC
  Administered 2012-10-16: 1000 mL via INTRAVENOUS

## 2012-10-16 MED ORDER — SODIUM CHLORIDE 0.9 % IJ SOLN
3.0000 mL | Freq: Two times a day (BID) | INTRAMUSCULAR | Status: DC
  Administered 2012-10-17 – 2012-10-20 (×7): 3 mL via INTRAVENOUS

## 2012-10-16 MED ORDER — THIAMINE HCL 100 MG/ML IJ SOLN
Freq: Once | INTRAVENOUS | Status: AC
  Administered 2012-10-16: 22:00:00 via INTRAVENOUS
  Filled 2012-10-16: qty 1000

## 2012-10-16 MED ORDER — VITAMIN B-1 100 MG PO TABS
100.0000 mg | ORAL_TABLET | Freq: Every day | ORAL | Status: DC
  Filled 2012-10-16: qty 1

## 2012-10-16 MED ORDER — POTASSIUM CHLORIDE CRYS ER 20 MEQ PO TBCR: 40.0000 meq | EXTENDED_RELEASE_TABLET | Freq: Two times a day (BID) | ORAL | Status: DC

## 2012-10-16 NOTE — Progress Notes (Signed)
Pt arrived to floor from ED via stretcher. Tele box was put on, pt placed in bed, VS taken, & pt oriented to room. CCMD was called. Pt in no apparent distress at this time however, keeps pulling on cords. rn will continue to monitor pt.

## 2012-10-16 NOTE — ED Notes (Signed)
Pt remains disoriented. Is taking repeatedly about paintings and art.

## 2012-10-16 NOTE — ED Notes (Signed)
Family practice at bedside.

## 2012-10-16 NOTE — ED Notes (Signed)
Pt is confused. Pt repeated states "You are a convicted felon" and cannot remember my name.
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
## 2012-10-16 NOTE — H&P (Signed)
Family Medicine Teaching [facility] Admission History and Physical Service Pager: [phone]  Patient name: Patrick Holt Medical record number: [id number] Date of birth: 04-Jun-1950 Age: 62 y.o. Gender: male  Primary Care Provider: Gaspar Bidding, DO Consultants: Psychiatry, Neurology Code Status: Presumed full, unable to address d/t AMS  Chief Complaint: Altered Mental Status  Assessment and Plan: DAEMIEN FRONCZAK is a 62 y.o. year old male presenting with altered mental state after being found down on floor. PMH is extensive and significant for Major depression with anxiety, psychosis, bipolar disorder, seizure disorder, HTN, A.fib, COPD, dermatomyositis, MI (multiple) and narcotic dependence with non-compliance to medical management.   Altered mental status: Patient has extensive psych history with Major depressive disorder, anxiety, bipolar d/o and psychosis. Per PCP and [facility] notes, it appears patient removed himself of all medications over the last year. He has been prescribed Seroquel,  Depakote and Cymbalta in the past. Recent refill on Xanax (#90), although negative for benzo on UDS today.  - Uncertain etiology of AMS, patient does have leukocytosis of 17K. Can not rule out infectious causes. Urine and blood cultures pending. Unfortunately he was started on broad spectrum antibiotics in ED just prior to obtaining labs on the floor. CXR resulted with bibasilar atelectasis. Will want to narrow antibiotics as soon as possible. Does not appear encephalopathic on exam.  - CT of head: No acute processes or hydrocephalus. Patchy bilateral white matter low attenuation with no evidence of vascular territory infarct.  Stable diffuse atrophy.  - CK: 4390; received 1L bolus in ED and 1.5 MIVF on floor.  - Ammonia pending - UDS normal; No benzo.  - Bedside swallow study before allowing PO - Consult Neurology and psychiatry in the AM (pt recently seen in [facility] in 09/2012)--> will need called -  Repeat CBC in am   Hypokalemia:Recent colonoscopy on the 14th. Patient was asked to hold demedex and take potassium supplement.  - Potassium today 3.0; added KDUR x3 doses.  - Continued to hold demadex for now. Watch for fluid overload. Weight 211.9 on admission, and is extremely dry.  - CMP in AM  Elevated CK: Likely from fall. Will continue to monitor.  - CK 4390; received 1 bolus of NS in ED; then 1.5 MIVF on floor.  - repeat CK and CMP in the AM  A.fib:Last Echo 12/13, mild LVH. Systolic function was vigorous. EF 65% to 70%. Wall motion was normal; there were no regional wall motion abnormalities. Grade 1 diastolic dysfunction. Mild AS. MV calcified. Left atrium mildly dilated.  Seizure disorder:Patient previously on Keppra. Has been non-compliant with medication. Uncertain if altered mental status/syncope is from seizure. CK is elevated, but likely from early rhabdomyolysis from being down for 3 days.  - Consult Neurology in the morning, likely will need EEG - Uncertain if patient will be compliant on Keppra given his history.   FEN/GI:  -NPO -Banana bag @1 .5 MIVF Prophylaxis: Hep SQ PT/OT and social consulted.  Disposition: Pending neurology and psychiatry evaluations.   History of Present Illness: Patrick Holt is a 62 y.o. year old male presenting to the ED by squad, after being found on the floor. EMS told ED provider they were called by non-identified person that stated Mr. Monnig had been found down on the floor today and he had been there for 3 days with no AC and "cut phone lines." Level V caveat applies to HPI. Patient believed this provider to be someone names "[name]" and repeatedly stated  he was a "convicted felon." He stated, "they never got me, I know this sounds weird but let me explain before you kill me." He asked me to please not "shoot him" and  "Amada Jupiter holder is also a convicted felon." He is alert and tells me, "my real name is Patrick Holt."    Review Of Systems: Unable to obtain Level V Caveat   Patient Active Problem List   Diagnosis Date Noted  . Hypokalemia 10/03/2012  . Benign neoplasm of colon 09/27/2012  . Stricture and stenosis of esophagus 09/27/2012  . Abdominal pain, left lower quadrant 09/26/2012  . Diverticulitis of colon without hemorrhage 07/26/2012  . Dystrophic nail 07/03/2012  . Polypharmacy 03/26/2012  . Thought disorder 02/21/2012    Class: Chronic  . 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 Psychotic disorder 02/14/2012  . Seizures 02/10/2012  . Atrial fibrillation 02/05/2012  . Coronary atherosclerosis of native coronary artery 02/05/2012  . High risk social situation 10/15/2011  . Ear pain 03/10/2011  . Dental attrition, excessive 03/10/2011  . Radicular low back pain 03/09/2011  . History of seizure disorder 01/29/2011  . Irritable bowel syndrome (IBS) 06/24/2010  . Urge incontinence 06/19/2010  . TOBACCO USER 11/23/2008  . OBESITY, UNSPECIFIED 07/25/2008  . DEPRESSION, MAJOR 05/31/2008  . ISCHEMIC HEART DISEASE 05/31/2008  . RAYNAUD'S DISEASE 05/31/2008  . HIATAL HERNIA 05/31/2008  . Rheumatoid arthritis(714.0) 05/31/2008  . FIBROMYALGIA 05/31/2008  . Generalized pain 05/31/2008  . DIABETES MELLITUS, TYPE II 01/26/2008  . HYPERLIPIDEMIA 01/26/2008  . ANXIETY, CHRONIC 01/26/2008  . HYPERTENSION 01/26/2008  . HEMORRHOIDS, INTERNAL 01/26/2008  . COPD 01/26/2008  . ESOPHAGEAL MOTILITY DISORDER 01/26/2008  . GERD 01/26/2008  . DIVERTICULOSIS OF COLON 01/26/2008  . Dermatomyositis 01/26/2008   Past Medical History: Past Medical History  Diagnosis Date  . Rheumatoid arthritis(714.0)   . Obesity   . Major depression     with acute psychotic break in 06/2010  . Diabetes mellitus   . Hypertension   . Hyperlipidemia   . Diverticulosis of colon   . COPD (chronic obstructive pulmonary disease)   . Anxiety   . GERD (gastroesophageal reflux disease)   .  Vertigo   . Seizures   . Fibromyalgia   . Dermatomyositis   . Myocardial infarct     mulitple (1999, 2000, 2004)  . Raynaud's disease   . Narcotic dependence   . Peripheral neuropathy   . Internal hemorrhoids   . Ischemic heart disease   . Hiatal hernia   . Gastritis   . Diverticulitis   . Hx of adenomatous colonic polyps   . Nephrolithiasis   . Anemia   . Esophageal stricture   . Esophageal dysmotility   . Dermatomyositis   . Paroxysmal a-fib   . Urge incontinence   . Otosclerosis   . Bipolar 1 disorder   . OCD (obsessive compulsive disorder)    Past Surgical History: Past Surgical History  Procedure Laterality Date  . Knee arthroscopy w/ meniscal repair  2009    left  . Lumbar disc surgery    . Squamous papilloma   2010    removed by Dr. Pollyann Kennedy ENT, noted on tongue  . Esophagogastroduodenoscopy N/A 09/27/2012    Procedure: ESOPHAGOGASTRODUODENOSCOPY (EGD);  Surgeon: Hart Carwin, MD;  Location: [facility];  Service: Endoscopy;  Laterality: N/A;  . Colonoscopy N/A 09/27/2012    Procedure: COLONOSCOPY;  Surgeon: Hart Carwin, MD;  Location: [facility];  Service: Endoscopy;  Laterality: N/A;   Social History: History  Substance Use Topics  . Smoking status: Current Every Day Smoker -- 0.50 packs/day    Types: Cigarettes  . Smokeless tobacco: Never Used     Comment: PATIENT STATES HE QUIT SMOKING LAST WINTER   . Alcohol Use: No     Comment: daily   Additional social history:  Please also refer to relevant sections of EMR.  Family History: Family History  Problem Relation Age of Onset  . Alcohol abuse Mother   . Depression Mother   . Heart disease Mother   . Diabetes Mother   . Stroke Mother   . Diabetes Other     1/2 brother   Allergies and Medications: Allergies  Allergen Reactions  . Betamethasone Dipropionate     Unknown  . Bupropion Hcl     Unknown  . Ciprofloxacin     REACTION: swelling  . Clobetasol     Unknown  . Codeine     Unknown   . Escitalopram Oxalate     Unknown  . Flagyl [Metronidazole]     REACTION: swelling  . Fluoxetine Hcl     Unknown  . Fluticasone-Salmeterol     Unknown  . Furosemide     Unknown  . Immune Globulins     Acute renal failure  . Paroxetine     Unknown  . Penicillins     Unknown  . Statins     Unknown  . Sulfa Antibiotics   . Tacrolimus     Unknown  . Tetanus Toxoid     Unknown  . Tuberculin Purified Protein Derivative     Unknown   No current facility-administered medications on file prior to encounter.   Current Outpatient Prescriptions on File Prior to Encounter  Medication Sig Dispense Refill  . ALPRAZolam (XANAX) 1 MG tablet Take 1 tablet (1 mg total) by mouth 3 (three) times daily as needed for sleep or anxiety.  90 tablet  0  . amiodarone (PACERONE) 200 MG tablet Take 1 tablet (200 mg total) by mouth daily before breakfast.  30 tablet  5  . aspirin EC 81 MG tablet Take 81 mg by mouth daily.      . collagenase (SANTYL) ointment Apply 1 application topically daily.  15 g  2  . esomeprazole (NEXIUM) 40 MG capsule Take 1 capsule (40 mg total) by mouth daily before breakfast.  30 capsule  11  . Ipratropium-Albuterol (COMBIVENT) 20-100 MCG/ACT AERS respimat Inhale 1 puff into the lungs every 6 (six) hours.      . methotrexate (RHEUMATREX) 2.5 MG tablet Take 1 tablet (2.5 mg total) by mouth See admin instructions. 3 times a week  12 tablet  1  . nitroGLYCERIN (NITROSTAT) 0.4 MG SL tablet Place 0.4 mg under the tongue every 5 (five) minutes as needed. For chest pain      . oxybutynin (DITROPAN) 5 MG tablet Take 1 tablet (5 mg total) by mouth 3 (three) times daily.  90 tablet  3  . potassium chloride SA (K-DUR,KLOR-CON) 20 MEQ tablet Take 2 tablets daily x 5 days  10 tablet  0  . rOPINIRole (REQUIP) 2 MG tablet Take 1 tablet (2 mg total) by mouth 3 (three) times daily.  360 tablet  0  . torsemide (DEMADEX) 20 MG tablet Take 1 tablet (20 mg total) by mouth daily.  30 tablet  2  .  [DISCONTINUED] oxybutynin (DITROPAN) 5 MG tablet Take 1 tablet (5 mg total) by  mouth 3 (three) times daily.  90 tablet  3    Objective: BP 150/130  Pulse 88  Temp(Src) 99.9 F (37.7 C) (Rectal)  Resp 29  SpO2 93% Exam: General/Psych: Alert. Psychotic features. Repeating same sentences over multiple times. Unable to answer questions. Slowed mildly slurred speech. Psychomotor agitation with arms, pulling on lead wires. Makes good eye contact.  HEENT: Ong. Dry and tachy mucous membranes. Poor dentition. Unable to access back of throat. Bilateral eyes with injections, and drainage in left eye. Nose with older abrasion and erythema on external nasal bridge. Bilateral nares patent, without discharge. Bilateral TM WNL.  Neck: Supple. No LAD appreciated.  Cardiovascular: RRR. 1/6 SM appreciated.  Respiratory: Diffuse Rhonchi present. No wheeze or crackles anterior or laterally, could not get patient to sit up for posterior exam  Abdomen: Soft, NTND. BS present. No masses palpated.  Extremities: No erythema or edema. Strength 5/5 bilateral UE/LE. Normal ROM bilaterally. Skin: Multiple contusions and scrapes (old)  over arms, shins and nose.  Neuro: Alert to person only. Left eye reactive to light and accommodation. Left eye reactive to accommodation only. EOMi. Patient not extremely cooperative, but appears CN grossly intact other than stated above. Unable to access gait.   Labs and Imaging:  Results for orders placed during the hospital encounter of 10/16/12 (from the past 24 hour(s))  CK     Status: Abnormal   Collection Time    10/16/12  4:27 PM      Result Value Range   Total CK 4390 (*) 7 - 232 U/L  CBC WITH DIFFERENTIAL     Status: Abnormal   Collection Time    10/16/12  4:27 PM      Result Value Range   WBC 17.4 (*) 4.0 - 10.5 K/uL   RBC 5.38  4.22 - 5.81 MIL/uL   Hemoglobin 16.9  13.0 - 17.0 g/dL   HCT 45.4  09.8 - 11.9 %   MCV 91.8  78.0 - 100.0 fL   MCH 31.4  26.0 - 34.0 pg    MCHC 34.2  30.0 - 36.0 g/dL   RDW 14.7 (*) 82.9 - 56.2 %   Platelets 385  150 - 400 K/uL   Neutrophils Relative % 82 (*) 43 - 77 %   Neutro Abs 14.2 (*) 1.7 - 7.7 K/uL   Lymphocytes Relative 9 (*) 12 - 46 %   Lymphs Abs 1.5  0.7 - 4.0 K/uL   Monocytes Relative 10  3 - 12 %   Monocytes Absolute 1.7 (*) 0.1 - 1.0 K/uL   Eosinophils Relative 0  0 - 5 %   Eosinophils Absolute 0.0  0.0 - 0.7 K/uL   Basophils Relative 0  0 - 1 %   Basophils Absolute 0.0  0.0 - 0.1 K/uL  COMPREHENSIVE METABOLIC PANEL     Status: Abnormal   Collection Time    10/16/12  4:27 PM      Result Value Range   Sodium 142  135 - 145 mEq/L   Potassium 3.0 (*) 3.5 - 5.1 mEq/L   Chloride 100  96 - 112 mEq/L   CO2 23  19 - 32 mEq/L   Glucose, Bld 101 (*) 70 - 99 mg/dL   BUN 41 (*) 6 - 23 mg/dL   Creatinine, Ser 1.30 (*) 0.50 - 1.35 mg/dL   Calcium 9.6  8.4 - 86.5 mg/dL   Total Protein 8.0  6.0 - 8.3 g/dL   Albumin 4.1  3.5 -  5.2 g/dL   AST 295 (*) 0 - 37 U/L   ALT 55 (*) 0 - 53 U/L   Alkaline Phosphatase 93  39 - 117 U/L   Total Bilirubin 1.7 (*) 0.3 - 1.2 mg/dL   GFR calc non Af Amer 47 (*) >90 mL/min   GFR calc Af Amer 54 (*) >90 mL/min  TROPONIN I     Status: None   Collection Time    10/16/12  4:27 PM      Result Value Range   Troponin I <0.30  <0.30 ng/mL  ACETAMINOPHEN LEVEL     Status: None   Collection Time    10/16/12  4:27 PM      Result Value Range   Acetaminophen (Tylenol), Serum <15.0  10 - 30 ug/mL  SALICYLATE LEVEL     Status: Abnormal   Collection Time    10/16/12  4:27 PM      Result Value Range   Salicylate Lvl <2.0 (*) 2.8 - 20.0 mg/dL  ETHANOL     Status: None   Collection Time    10/16/12  4:27 PM      Result Value Range   Alcohol, Ethyl (B) <11  0 - 11 mg/dL  POCT I-STAT, CHEM 8     Status: Abnormal   Collection Time    10/16/12  4:46 PM      Result Value Range   Sodium 145  135 - 145 mEq/L   Potassium 3.0 (*) 3.5 - 5.1 mEq/L   Chloride 105  96 - 112 mEq/L   BUN 40 (*) 6 -  23 mg/dL   Creatinine, Ser 6.21 (*) 0.50 - 1.35 mg/dL   Glucose, Bld 308 (*) 70 - 99 mg/dL   Calcium, Ion 6.57 (*) 1.13 - 1.30 mmol/L   TCO2 26  0 - 100 mmol/L   Hemoglobin 18.7 (*) 13.0 - 17.0 g/dL   HCT 84.6 (*) 96.2 - 95.2 %  CG4 I-STAT (LACTIC ACID)     Status: None   Collection Time    10/16/12  4:46 PM      Result Value Range   Lactic Acid, Venous 1.59  0.5 - 2.2 mmol/L  URINALYSIS, ROUTINE W REFLEX MICROSCOPIC     Status: Abnormal   Collection Time    10/16/12  6:18 PM      Result Value Range   Color, Urine AMBER (*) YELLOW   APPearance CLEAR  CLEAR   Specific Gravity, Urine 1.023  1.005 - 1.030   pH 5.5  5.0 - 8.0   Glucose, UA NEGATIVE  NEGATIVE mg/dL   Hgb urine dipstick LARGE (*) NEGATIVE   Bilirubin Urine MODERATE (*) NEGATIVE   Ketones, ur 40 (*) NEGATIVE mg/dL   Protein, ur 30 (*) NEGATIVE mg/dL   Urobilinogen, UA 1.0  0.0 - 1.0 mg/dL   Nitrite NEGATIVE  NEGATIVE   Leukocytes, UA NEGATIVE  NEGATIVE  URINE RAPID DRUG SCREEN (HOSP PERFORMED)     Status: None   Collection Time    10/16/12  6:18 PM      Result Value Range   Opiates NONE DETECTED  NONE DETECTED   Cocaine NONE DETECTED  NONE DETECTED   Benzodiazepines NONE DETECTED  NONE DETECTED   Amphetamines NONE DETECTED  NONE DETECTED   Tetrahydrocannabinol NONE DETECTED  NONE DETECTED   Barbiturates NONE DETECTED  NONE DETECTED  URINE MICROSCOPIC-ADD ON     Status: Abnormal   Collection Time    10/16/12  6:18 PM      Result Value Range   Squamous Epithelial / LPF FEW (*) RARE   WBC, UA 0-2  <3 WBC/hpf   RBC / HPF 0-2  <3 RBC/hpf   Bacteria, UA FEW (*) RARE   Casts HYALINE CASTS (*) NEGATIVE   Urine-Other MUCOUS PRESENT      CXR:  Heart is normal size. Minimal bibasilar atelectasis. No effusions or acute bony abnormality.  CT Head wo contrast: Unchanged appearance from prior study.  Chronic involutional change with no acute traumatic injury.   CT CERVICAL SPINE:  Incidental note is made of debris  within the pharynx, likely retained secretions.  There is no evidence of cervical spine acute fracture.  There is a chronic corticated fracture of the T1 spinous process.  IMPRESSION: No acute fracture.  Filling defects/debris in the hypopharynx likely related to mucous and retained secretions.  Consider reevaluation to exclude polypoid lesions when the patient's physical condition allows.   .  EKG: Prolonged QTc, St depression in anterior and lateral leads noted.   Natalia Leatherwood, DO 10/16/2012, 7:55 PM PGY-2, [facility] FPTS Intern pager: [phone], text pages welcome

## 2012-10-16 NOTE — Progress Notes (Signed)
Interim note: Patient with repeat EKG improved quality, still with mild ST depression  In leads I and II. NSR. Inferior infarct of unknown age. Prolonged QTc 513. Good R wave progression. POCT trop and repeat trop on floor are <0.30. Patient is sleeping soundly in bed, not requiring any PRN pain medications. Spoke with attending, Dr. Gwendolyn Grant, and we felt at this time cardiology did not need to be consulted. Will continue to monitor troponin (cycled) and EKG in the am.  - Cr improving from 1.6 to 1.27 with IVF. - Ammonia level WNL. Kuneff, Renee DO

## 2012-10-16 NOTE — ED Notes (Signed)
Myself and Heather, EMT undressed pt, placed in gown, on monitor, continuous pulse oximetry and blood pressure cuff; vitals and EKG being performed 

## 2012-10-16 NOTE — ED Provider Notes (Signed)
CSN: [id number]     Arrival date & time 10/16/12  1520 History   First MD Initiated Contact with Patient 10/16/12 1525     Chief Complaint  Patient presents with  . Altered Mental Status   (Consider location/radiation/quality/duration/timing/severity/associated sxs/prior Treatment) HPI Comments: Patient found at home with altered mental status. Level V caveat applies. Anonymous caller told EMS she's been on the floor for the past 3 days. His been urinating on himself. EMS reports his house is really hot and that his phone lines are not working. Patient is warm he is alert and oriented to himself only.  when he is asked to date he gets his birth date 10-Jan-1951. no obvious trauma. He has erythema to his head and arms bilaterally. there is no focal weakness, numbness or tingling. He answers yes to all questions.   The history is provided by the patient and the EMS personnel. The history is limited by the condition of the patient.    Past Medical History  Diagnosis Date  . Rheumatoid arthritis(714.0)   . Obesity   . Major depression     with acute psychotic break in 06/2010  . Diabetes mellitus   . Hypertension   . Hyperlipidemia   . Diverticulosis of colon   . COPD (chronic obstructive pulmonary disease)   . Anxiety   . GERD (gastroesophageal reflux disease)   . Vertigo   . Seizures   . Fibromyalgia   . Dermatomyositis   . Myocardial infarct     mulitple (1999, 2000, 2004)  . Raynaud's disease   . Narcotic dependence   . Peripheral neuropathy   . Internal hemorrhoids   . Ischemic heart disease   . Hiatal hernia   . Gastritis   . Diverticulitis   . Hx of adenomatous colonic polyps   . Nephrolithiasis   . Anemia   . Esophageal stricture   . Esophageal dysmotility   . Dermatomyositis   . Paroxysmal a-fib   . Urge incontinence   . Otosclerosis   . Bipolar 1 disorder   . OCD (obsessive compulsive disorder)    Past Surgical History  Procedure Laterality Date  . Knee  arthroscopy w/ meniscal repair  2009    left  . Lumbar disc surgery    . Squamous papilloma   2010    removed by Dr. Pollyann Kennedy ENT, noted on tongue  . Esophagogastroduodenoscopy N/A 09/27/2012    Procedure: ESOPHAGOGASTRODUODENOSCOPY (EGD);  Surgeon: Hart Carwin, MD;  Location: [facility];  Service: Endoscopy;  Laterality: N/A;  . Colonoscopy N/A 09/27/2012    Procedure: COLONOSCOPY;  Surgeon: Hart Carwin, MD;  Location: [facility];  Service: Endoscopy;  Laterality: N/A;   Family History  Problem Relation Age of Onset  . Alcohol abuse Mother   . Depression Mother   . Heart disease Mother   . Diabetes Mother   . Stroke Mother   . Diabetes Other     1/2 brother   History  Substance Use Topics  . Smoking status: Current Every Day Smoker -- 0.50 packs/day    Types: Cigarettes  . Smokeless tobacco: Never Used     Comment: PATIENT STATES HE QUIT SMOKING LAST WINTER   . Alcohol Use: No     Comment: daily    Review of Systems  Unable to perform ROS: Mental status change    Allergies  Betamethasone dipropionate; Bupropion hcl; Ciprofloxacin; Clobetasol; Codeine; Escitalopram oxalate; Flagyl; Fluoxetine hcl; Fluticasone-salmeterol; Furosemide; Immune globulins; Paroxetine; Penicillins; Statins; Sulfa  antibiotics; Tacrolimus; Tetanus toxoid; and Tuberculin purified protein derivative  Home Medications   Current Outpatient Rx  Name  Route  Sig  Dispense  Refill  . ALPRAZolam (XANAX) 1 MG tablet   Oral   Take 1 tablet (1 mg total) by mouth 3 (three) times daily as needed for sleep or anxiety.   90 tablet   0     MUST GET FURTHER REFILLS FROM PCP IF NEEDED   . amiodarone (PACERONE) 200 MG tablet   Oral   Take 1 tablet (200 mg total) by mouth daily before breakfast.   30 tablet   5   . aspirin EC 81 MG tablet   Oral   Take 81 mg by mouth daily.         . collagenase (SANTYL) ointment   Topical   Apply 1 application topically daily.   15 g   2   . esomeprazole  (NEXIUM) 40 MG capsule   Oral   Take 1 capsule (40 mg total) by mouth daily before breakfast.   30 capsule   11   . Ipratropium-Albuterol (COMBIVENT) 20-100 MCG/ACT AERS respimat   Inhalation   Inhale 1 puff into the lungs every 6 (six) hours.         . methotrexate (RHEUMATREX) 2.5 MG tablet   Oral   Take 1 tablet (2.5 mg total) by mouth See admin instructions. 3 times a week   12 tablet   1   . nitroGLYCERIN (NITROSTAT) 0.4 MG SL tablet   Sublingual   Place 0.4 mg under the tongue every 5 (five) minutes as needed. For chest pain         . oxybutynin (DITROPAN) 5 MG tablet   Oral   Take 1 tablet (5 mg total) by mouth 3 (three) times daily.   90 tablet   3   . potassium chloride SA (K-DUR,KLOR-CON) 20 MEQ tablet      Take 2 tablets daily x 5 days   10 tablet   0   . rOPINIRole (REQUIP) 2 MG tablet   Oral   Take 1 tablet (2 mg total) by mouth 3 (three) times daily.   360 tablet   0     Needs to be addressed by Psychiatrist for further  ...   . torsemide (DEMADEX) 20 MG tablet   Oral   Take 1 tablet (20 mg total) by mouth daily.   30 tablet   2    BP 144/78  Pulse 88  Temp(Src) 99.9 F (37.7 C) (Rectal)  Resp 29  SpO2 95% Physical Exam  Constitutional: He appears well-developed and well-nourished. No distress.  HENT:  Head: Normocephalic and atraumatic.  Mouth/Throat: Oropharynx is clear and moist. No oropharyngeal exudate.  Old abrasions to forehead and nose  Eyes: Conjunctivae and EOM are normal. Pupils are equal, round, and reactive to light.  Neck: Normal range of motion. Neck supple.  No meningismus  Cardiovascular: Normal rate, regular rhythm and normal heart sounds.   Pulmonary/Chest: Effort normal and breath sounds normal. No respiratory distress.  Abdominal: Soft. There is no tenderness. There is no rebound and no guarding.  Musculoskeletal: Normal range of motion. He exhibits no edema and no tenderness.  Neurological: He is alert. No  cranial nerve deficit. He exhibits normal muscle tone. Coordination normal.  Oriented to person only, follows some commands. Moving all extremities equally.  Skin: Skin is warm. There is erythema.  Erythema to face and bilateral arms  ED Course  Procedures (including critical care time) Labs Review Labs Reviewed  URINALYSIS, ROUTINE W REFLEX MICROSCOPIC - Abnormal; Notable for the following:    Color, Urine AMBER (*)    Hgb urine dipstick LARGE (*)    Bilirubin Urine MODERATE (*)    Ketones, ur 40 (*)    Protein, ur 30 (*)    All other components within normal limits  CK - Abnormal; Notable for the following:    Total CK 4390 (*)    All other components within normal limits  CBC WITH DIFFERENTIAL - Abnormal; Notable for the following:    WBC 17.4 (*)    RDW 17.3 (*)    Neutrophils Relative % 82 (*)    Neutro Abs 14.2 (*)    Lymphocytes Relative 9 (*)    Monocytes Absolute 1.7 (*)    All other components within normal limits  COMPREHENSIVE METABOLIC PANEL - Abnormal; Notable for the following:    Potassium 3.0 (*)    Glucose, Bld 101 (*)    BUN 41 (*)    Creatinine, Ser 1.53 (*)    AST 114 (*)    ALT 55 (*)    Total Bilirubin 1.7 (*)    GFR calc non Af Amer 47 (*)    GFR calc Af Amer 54 (*)    All other components within normal limits  SALICYLATE LEVEL - Abnormal; Notable for the following:    Salicylate Lvl <2.0 (*)    All other components within normal limits  URINE MICROSCOPIC-ADD ON - Abnormal; Notable for the following:    Squamous Epithelial / LPF FEW (*)    Bacteria, UA FEW (*)    Casts HYALINE CASTS (*)    All other components within normal limits  POCT I-STAT, CHEM 8 - Abnormal; Notable for the following:    Potassium 3.0 (*)    BUN 40 (*)    Creatinine, Ser 1.60 (*)    Glucose, Bld 101 (*)    Calcium, Ion 1.12 (*)    Hemoglobin 18.7 (*)    HCT 55.0 (*)    All other components within normal limits  URINE RAPID DRUG SCREEN (HOSP PERFORMED)  TROPONIN I   ACETAMINOPHEN LEVEL  ETHANOL  CG4 I-STAT (LACTIC ACID)   Imaging Review Dg Chest 2 View  10/16/2012   CLINICAL DATA:  Altered mental status.  EXAM: CHEST  2 VIEW  COMPARISON:  07/24/2012  FINDINGS: Heart is normal size. Minimal bibasilar atelectasis. No effusions or acute bony abnormality.  IMPRESSION: Bibasilar atelectasis.   Electronically Signed   By: Charlett Nose   On: 10/16/2012 16:22   Ct Head Wo Contrast  10/16/2012   *RADIOLOGY REPORT*  Clinical Data:  CT HEAD WITHOUT CONTRAST CT CERVICAL SPINE WITHOUT CONTRAST  Technique:  Multidetector CT imaging of the head and cervical spine was performed following the standard protocol without intravenous contrast.  Multiplanar CT image reconstructions of the cervical spine were also generated.  Comparison: 07/24/12  Findings: No displaced skull fractures.  There is patchy bilateral white matter low attenuation with no evidence of vascular territory infarct.  No hemorrhage extra-axial fluid.  Stable diffuse atrophy. No hydrocephalus.  IMPRESSION:  Unchanged appearance from prior study.  Chronic involutional change with no acute traumatic injury.  CT CERVICAL SPINE  Findings: Incidental note is made of debris within the pharynx, likely retained secretions.  There is no evidence of cervical spine acute fracture.  There is a chronic corticated fracture of the T1  spinous process.  IMPRESSION: No acute fracture.  Filling defects/debris in the hypopharynx likely related to mucous and retained secretions.  Consider reevaluation to exclude polypoid lesions when the patient's physical condition allows.   Original Report Authenticated By: Esperanza Heir, M.D.   Ct Cervical Spine Wo Contrast  10/16/2012   *RADIOLOGY REPORT*  Clinical Data:  CT HEAD WITHOUT CONTRAST CT CERVICAL SPINE WITHOUT CONTRAST  Technique:  Multidetector CT imaging of the head and cervical spine was performed following the standard protocol without intravenous contrast.  Multiplanar CT image  reconstructions of the cervical spine were also generated.  Comparison: 07/24/12  Findings: No displaced skull fractures.  There is patchy bilateral white matter low attenuation with no evidence of vascular territory infarct.  No hemorrhage extra-axial fluid.  Stable diffuse atrophy. No hydrocephalus.  IMPRESSION:  Unchanged appearance from prior study.  Chronic involutional change with no acute traumatic injury.  CT CERVICAL SPINE  Findings: Incidental note is made of debris within the pharynx, likely retained secretions.  There is no evidence of cervical spine acute fracture.  There is a chronic corticated fracture of the T1 spinous process.  IMPRESSION: No acute fracture.  Filling defects/debris in the hypopharynx likely related to mucous and retained secretions.  Consider reevaluation to exclude polypoid lesions when the patient's physical condition allows.   Original Report Authenticated By: Esperanza Heir, M.D.    MDM   1. Altered mental status   2. Rhabdomyolysis    Found down at home with altered mental status.  Vitals stable. No evidence of trauma. Extensive psych history.  CT head and C spine negative. Significant EKG changes with ST depressions laterally.  When asked about chest pain, patient says yes, but says yes too all questions. Troponin negative.  IVF started.   Elevated CK and Cr. Empiric antibiotics given: vancomycin and aztreonam based on patient's allergies.  CXR negative. UA negative.  No meningismus on exam, afebrile. Uncertain cause of AMS. Patient dehydrated with early rhabdomyolysis.  HR and BP are normal which makes alcohol withdrawal seem less likely. Consider infection, leukocytosis but no fever, no source identified. No meningismus.   Will need admission for further workup.  Vitals stable in ED and protecting airway. Remains disoriented and confused.     Date: 10/16/2012  Rate: 83  Rhythm: normal sinus rhythm  QRS Axis: normal  Intervals: normal  ST/T Wave  abnormalities: ST depressions anteriorly and ST depressions laterally  Conduction Disutrbances:none  Narrative Interpretation: new ST depressions anterior laterally  Old EKG Reviewed: changes noted  CRITICAL CARE Performed by: Glynn Octave Total critical care time: 30 Critical care time was exclusive of separately billable procedures and treating other patients. Critical care was necessary to treat or prevent imminent or life-threatening deterioration. Critical care was time spent personally by me on the following activities: development of treatment plan with patient and/or surrogate as well as nursing, discussions with consultants, evaluation of patient's response to treatment, examination of patient, obtaining history from patient or surrogate, ordering and performing treatments and interventions, ordering and review of laboratory studies, ordering and review of radiographic studies, pulse oximetry and re-evaluation of patient's condition.    Glynn Octave, MD 10/17/12 [phone]

## 2012-10-16 NOTE — Progress Notes (Signed)
ANTIBIOTIC CONSULT NOTE - INITIAL  Pharmacy Consult for azactam, vancomycin Indication:  Empiric coverage  Allergies  Allergen Reactions  . Betamethasone Dipropionate     Unknown  . Bupropion Hcl     Unknown  . Ciprofloxacin     REACTION: swelling  . Clobetasol     Unknown  . Codeine     Unknown  . Escitalopram Oxalate     Unknown  . Flagyl [Metronidazole]     REACTION: swelling  . Fluoxetine Hcl     Unknown  . Fluticasone-Salmeterol     Unknown  . Furosemide     Unknown  . Immune Globulins     Acute renal failure  . Paroxetine     Unknown  . Penicillins     Unknown  . Statins     Unknown  . Sulfa Antibiotics   . Tacrolimus     Unknown  . Tetanus Toxoid     Unknown  . Tuberculin Purified Protein Derivative     Unknown    Vital Signs: Temp: 99.9 F (37.7 C) (09/01 1544) Temp src: Rectal (09/01 1544) BP: 144/78 mmHg (09/01 1737) Pulse Rate: 88 (09/01 1737) Intake/Output from previous day:   Intake/Output from this shift:    Labs:  Recent Labs  10/16/12 1627 10/16/12 1646  WBC 17.4*  --   HGB 16.9 18.7*  PLT 385  --   CREATININE 1.53* 1.60*   The CrCl is unknown because both a height and weight (above a minimum accepted value) are required for this calculation. No results found for this basename: VANCOTROUGH, VANCOPEAK, VANCORANDOM, GENTTROUGH, GENTPEAK, GENTRANDOM, TOBRATROUGH, TOBRAPEAK, TOBRARND, AMIKACINPEAK, AMIKACINTROU, AMIKACIN,  in the last 72 hours   Microbiology: No results found for this or any previous visit (from the past 720 hour(s)).  Medical History: Past Medical History  Diagnosis Date  . Rheumatoid arthritis(714.0)   . Obesity   . Major depression     with acute psychotic break in 06/2010  . Diabetes mellitus   . Hypertension   . Hyperlipidemia   . Diverticulosis of colon   . COPD (chronic obstructive pulmonary disease)   . Anxiety   . GERD (gastroesophageal reflux disease)   . Vertigo   . Seizures   . Fibromyalgia    . Dermatomyositis   . Myocardial infarct     mulitple (1999, 2000, 2004)  . Raynaud's disease   . Narcotic dependence   . Peripheral neuropathy   . Internal hemorrhoids   . Ischemic heart disease   . Hiatal hernia   . Gastritis   . Diverticulitis   . Hx of adenomatous colonic polyps   . Nephrolithiasis   . Anemia   . Esophageal stricture   . Esophageal dysmotility   . Dermatomyositis   . Paroxysmal a-fib   . Urge incontinence   . Otosclerosis   . Bipolar 1 disorder   . OCD (obsessive compulsive disorder)     Assessment: 62 yo male here with AMS to begin antibiotics for empiric coverage with azactam and vancomycin (patient noted with multiple antibiotic allergies). WBC= 17.4, SCr= 1.6 and CrCl ~ 50 (normalized).  Vancomycin 1000mg  is currently being administered.  9/1 azactam 9/1 vanco  Goal of Therapy:  Vancomycin trough level 15-20 mcg/ml  Plan:  -Azactam 1gm IV q8hr and vancomycin 750mg  IV q12h -Will follow renal function, cultures and clinical progress  Harland German, Pharm D 10/16/2012 6:13 PM

## 2012-10-16 NOTE — ED Notes (Signed)
Pt is talking to things in his room that are not there. Is pointing and gesturing to the wall.

## 2012-10-17 ENCOUNTER — Encounter (HOSPITAL_COMMUNITY): Payer: Self-pay | Admitting: Physician Assistant

## 2012-10-17 DIAGNOSIS — J449 Chronic obstructive pulmonary disease, unspecified: Secondary | ICD-10-CM

## 2012-10-17 DIAGNOSIS — Z8669 Personal history of other diseases of the nervous system and sense organs: Secondary | ICD-10-CM

## 2012-10-17 DIAGNOSIS — E119 Type 2 diabetes mellitus without complications: Secondary | ICD-10-CM

## 2012-10-17 DIAGNOSIS — I10 Essential (primary) hypertension: Secondary | ICD-10-CM

## 2012-10-17 DIAGNOSIS — I2510 Atherosclerotic heart disease of native coronary artery without angina pectoris: Secondary | ICD-10-CM

## 2012-10-17 DIAGNOSIS — E876 Hypokalemia: Secondary | ICD-10-CM

## 2012-10-17 DIAGNOSIS — F29 Unspecified psychosis not due to a substance or known physiological condition: Secondary | ICD-10-CM

## 2012-10-17 DIAGNOSIS — I480 Paroxysmal atrial fibrillation: Secondary | ICD-10-CM | POA: Diagnosis not present

## 2012-10-17 DIAGNOSIS — F17200 Nicotine dependence, unspecified, uncomplicated: Secondary | ICD-10-CM

## 2012-10-17 DIAGNOSIS — R52 Pain, unspecified: Secondary | ICD-10-CM

## 2012-10-17 DIAGNOSIS — I4891 Unspecified atrial fibrillation: Secondary | ICD-10-CM

## 2012-10-17 DIAGNOSIS — Z79899 Other long term (current) drug therapy: Secondary | ICD-10-CM

## 2012-10-17 DIAGNOSIS — Z7289 Other problems related to lifestyle: Secondary | ICD-10-CM

## 2012-10-17 DIAGNOSIS — I259 Chronic ischemic heart disease, unspecified: Secondary | ICD-10-CM

## 2012-10-17 DIAGNOSIS — M3390 Dermatopolymyositis, unspecified, organ involvement unspecified: Secondary | ICD-10-CM

## 2012-10-17 DIAGNOSIS — F1027 Alcohol dependence with alcohol-induced persisting dementia: Secondary | ICD-10-CM

## 2012-10-17 DIAGNOSIS — F329 Major depressive disorder, single episode, unspecified: Secondary | ICD-10-CM

## 2012-10-17 DIAGNOSIS — F411 Generalized anxiety disorder: Secondary | ICD-10-CM

## 2012-10-17 DIAGNOSIS — R4182 Altered mental status, unspecified: Secondary | ICD-10-CM

## 2012-10-17 DIAGNOSIS — K030 Excessive attrition of teeth: Secondary | ICD-10-CM

## 2012-10-17 LAB — COMPREHENSIVE METABOLIC PANEL
Alkaline Phosphatase: 77 U/L (ref 39–117)
BUN: 30 mg/dL — ABNORMAL HIGH (ref 6–23)
CO2: 27 mEq/L (ref 19–32)
Chloride: 108 mEq/L (ref 96–112)
GFR calc Af Amer: 82 mL/min — ABNORMAL LOW (ref >90)
GFR calc non Af Amer: 70 mL/min — ABNORMAL LOW (ref >90)
Glucose, Bld: 93 mg/dL (ref 70–99)
Potassium: 3.1 mEq/L — ABNORMAL LOW (ref 3.5–5.1)
Total Bilirubin: 1.6 mg/dL — ABNORMAL HIGH (ref 0.3–1.2)

## 2012-10-17 LAB — GLUCOSE, CAPILLARY
Glucose-Capillary: 83 mg/dL (ref 70–99)
Glucose-Capillary: 85 mg/dL (ref 70–99)

## 2012-10-17 LAB — CK: Total CK: 1871 U/L — ABNORMAL HIGH (ref 7–232)

## 2012-10-17 LAB — CBC
HCT: 44.5 % (ref 39.0–52.0)
Hemoglobin: 15.5 g/dL (ref 13.0–17.0)
MCHC: 34.8 g/dL (ref 30.0–36.0)
RBC: 4.82 MIL/uL (ref 4.22–5.81)
WBC: 13.6 10*3/uL — ABNORMAL HIGH (ref 4.0–10.5)

## 2012-10-17 LAB — TROPONIN I: Troponin I: <0.3 ng/mL (ref <0.30)

## 2012-10-17 LAB — HIV ANTIBODY (ROUTINE TESTING W REFLEX): HIV: NONREACTIVE

## 2012-10-17 MED ORDER — LORAZEPAM 2 MG/ML IJ SOLN: 2.0000 mg | INTRAMUSCULAR | Status: DC | PRN

## 2012-10-17 MED ORDER — DEXTROSE 5 % IV SOLN
40.0000 meq | Freq: Once | INTRAVENOUS | Status: DC
  Filled 2012-10-17: qty 9.09

## 2012-10-17 MED ORDER — LEVETIRACETAM 500 MG PO TABS
500.0000 mg | ORAL_TABLET | Freq: Two times a day (BID) | ORAL | Status: DC
  Administered 2012-10-17 – 2012-10-20 (×7): 500 mg via ORAL
  Filled 2012-10-17 (×10): qty 1

## 2012-10-17 MED ORDER — SODIUM CHLORIDE 0.9 % IV SOLN
INTRAVENOUS | Status: DC
  Administered 2012-10-17 – 2012-10-19 (×6): via INTRAVENOUS

## 2012-10-17 MED ORDER — POTASSIUM CHLORIDE 10 MEQ/100ML IV SOLN
10.0000 meq | INTRAVENOUS | Status: AC
  Administered 2012-10-17 (×4): 10 meq via INTRAVENOUS
  Filled 2012-10-17: qty 400

## 2012-10-17 MED ORDER — DIVALPROEX SODIUM ER 500 MG PO TB24
500.0000 mg | ORAL_TABLET | Freq: Two times a day (BID) | ORAL | Status: DC
  Administered 2012-10-18 – 2012-10-20 (×6): 500 mg via ORAL
  Filled 2012-10-17 (×7): qty 1

## 2012-10-17 MED ORDER — METOPROLOL SUCCINATE ER 25 MG PO TB24
25.0000 mg | ORAL_TABLET | Freq: Every day | ORAL | Status: DC
  Administered 2012-10-17 – 2012-10-18 (×2): 25 mg via ORAL
  Filled 2012-10-17 (×3): qty 1

## 2012-10-17 MED ORDER — POTASSIUM CHLORIDE CRYS ER 20 MEQ PO TBCR
40.0000 meq | EXTENDED_RELEASE_TABLET | Freq: Two times a day (BID) | ORAL | Status: AC
  Administered 2012-10-17 (×2): 40 meq via ORAL
  Filled 2012-10-17 (×2): qty 2

## 2012-10-17 MED ORDER — LORAZEPAM 2 MG/ML IJ SOLN
2.0000 mg | INTRAMUSCULAR | Status: DC | PRN
  Administered 2012-10-19: 2 mg via INTRAVENOUS
  Filled 2012-10-17: qty 1

## 2012-10-17 MED ORDER — RISPERIDONE 0.5 MG PO TBDP
0.5000 mg | ORAL_TABLET | Freq: Two times a day (BID) | ORAL | Status: DC | PRN
  Filled 2012-10-17: qty 1

## 2012-10-17 NOTE — Assessment & Plan Note (Signed)
This is a very difficult situation.  Patrick Holt has a very poor accurate recall of prior medications and allergies which further complications his care.  He has had multiple acute psychotic breaks most recently in December of 2013 in January of 2014.  He was initially started on anti-psychotic but was lost to followup for short period in our clinic as well he never established with psychiatry until August of 2013.  Given his tangential thought process and his thought disorder it was recommended on both occasions during his acute hospitalizations in the ED visits that he be placed into a Geri-psychiatric unit.  He was in the emergency department and on the family medicine teaching service long enough that his thought process cleared and he was able to be discharged home prior to going to an inpatient facility.

## 2012-10-17 NOTE — Evaluation (Signed)
Physical Therapy Evaluation Patient Details Name: Patrick Holt MRN: [id number] DOB: 1950-06-24 Today's Date: 10/17/2012 Time: 0743-0800 PT Time Calculation (min): 17 min  PT Assessment / Plan / Recommendation History of Present Illness  Briefly, 62 yo M with very extensive PMH including mutliple prior psychotic breaks, bipolar disorder, HTN, questionable seizure disorder, atrial fibrillation not an anticoagulant candidate, dermatomyositis for which he has been taking Methotrexate for many years, CAD, and issues with non-compliance who presents with altered mental status.  Patient unable to relate reason for hospitalization either last night or this AM.  No overt complaints this AM.  Per ED, patient was down for multiple days in his apartment and "anonymous caller" reported this to EMS  Clinical Impression  Pt with significant cognitive deficits impairing mobility and safety at this time. Pt will benefit from acute therapy to maximize mobility, safety and function. Pt at this time needs 24hr supervision/assist for all mobility and unable to determine if this is available for pt at this time. Pt repeatedly stating he can't walk and needs a WC and then ambulated the entire 66M unit. Pt also stating "all my symptoms are coming back" "I have parkinson's, CA, muscular dystrophy". Pt impulsive with gait and yet required extensive direct cues to initiate mobility. Will follow.     PT Assessment  Patient needs continued PT services    Follow Up Recommendations  Supervision/Assistance - 24 hour;SNF;Home health PT (pending available assist at DC)    Does the patient have the potential to tolerate intense rehabilitation      Barriers to Discharge Decreased caregiver support      Equipment Recommendations  None recommended by PT    Recommendations for Other Services OT consult   Frequency Min 3X/week    Precautions / Restrictions Precautions Precautions: Fall   Pertinent Vitals/Pain No  pain sats 91-96% on RA      Mobility  Bed Mobility Bed Mobility: Supine to Sit Supine to Sit: 4: Min assist;HOB flat;With rails Details for Bed Mobility Assistance: max cueing to attend to task and for safety Transfers Transfers: Sit to Stand;Stand to Sit Sit to Stand: 4: Min assist;From bed Stand to Sit: 4: Min assist;To chair/3-in-1;With armrests Details for Transfer Assistance: max cueing to attend to task and for safety, cues for hand placement Ambulation/Gait Ambulation/Gait Assistance: 4: Min assist Ambulation Distance (Feet): 160 Feet Assistive device: Rolling walker Ambulation/Gait Assistance Details: Assist to maintain safe distance to RW, cueing for directions, max cueing to attend to task and for safety Gait Pattern: Step-through pattern;Decreased stride length;Narrow base of support Gait velocity: too quick for pt safety Stairs: No    Exercises     PT Diagnosis: Abnormality of gait;Altered mental status  PT Problem List: Decreased activity tolerance;Decreased balance;Decreased mobility;Decreased coordination;Decreased knowledge of use of DME;Decreased cognition PT Treatment Interventions: Gait training;DME instruction;Functional mobility training;Therapeutic activities;Therapeutic exercise;Patient/family education;Cognitive remediation     PT Goals(Current goals can be found in the care plan section) Acute Rehab PT Goals Patient Stated Goal: pt unable to state PT Goal Formulation: With patient Time For Goal Achievement: 10/31/12 Potential to Achieve Goals: Fair  Visit Information  Last PT Received On: 10/17/12 Assistance Needed: +1 History of Present Illness: Briefly, 62 yo M with very extensive PMH including mutliple prior psychotic breaks, bipolar disorder, HTN, questionable seizure disorder, atrial fibrillation not an anticoagulant candidate, dermatomyositis for which he has been taking Methotrexate for many years, CAD, and issues with non-compliance who  presents with altered mental status.  Patient  unable to relate reason for hospitalization either last night or this AM.  No overt complaints this AM.  Per ED, patient was down for multiple days in his apartment and "anonymous caller" reported this to EMS       Prior Functioning  Home Living Family/patient expects to be discharged to:: Private residence Living Arrangements: Alone Type of Home: House Home Access: Ramped entrance Home Layout: One level Home Equipment: Electric scooter;Cane - single point;Walker - 2 wheels Additional Comments: pt unable to provide clear PLOF and information taken from prior admission Prior Function Level of Independence: Independent Comments: pt notes using power scooter when outside of home.  Only warms up meals in microwave.  pt notes he uses a cab to go to grocery store.   Communication Communication: No difficulties Dominant Hand: Right    Cognition  Cognition Arousal/Alertness: Awake/alert Behavior During Therapy: Restless;Impulsive Overall Cognitive Status: No family/caregiver present to determine baseline cognitive functioning Memory: Decreased short-term memory    Extremity/Trunk Assessment Upper Extremity Assessment Upper Extremity Assessment: Overall WFL for tasks assessed Lower Extremity Assessment Lower Extremity Assessment: Generalized weakness Cervical / Trunk Assessment Cervical / Trunk Assessment: Normal   Balance Balance Balance Assessed: Yes Static Sitting Balance Static Sitting - Balance Support: Feet supported Static Sitting - Level of Assistance: 5: Stand by assistance Static Sitting - Comment/# of Minutes: 3  End of Session PT - End of Session Equipment Utilized During Treatment: Gait belt Activity Tolerance: Patient tolerated treatment well Patient left: in chair;with call bell/phone within reach;Other (comment) (bil mittens reapplied) Nurse Communication: Mobility status  GP     Toney Sang [facility] 10/17/2012, 10:58  AM Delaney Meigs, PT [phone]

## 2012-10-17 NOTE — Progress Notes (Signed)
UR Completed.  Rinoa Garramone Jane [phone] 10/17/2012  

## 2012-10-17 NOTE — Progress Notes (Signed)
Pt.is now sleeping ;report given to [facility] .Transferred to micu 05 via bed & telemetry on. & personal belongings with pt.

## 2012-10-17 NOTE — Evaluation (Signed)
Clinical/Bedside Swallow Evaluation Patient Details  Name: Patrick Holt MRN: [id number] Date of Birth: 03/26/1950  Today's Date: 10/17/2012 Time: 1035-1050 SLP Time Calculation (min): 15 min  Past Medical History:  Past Medical History  Diagnosis Date  . Rheumatoid arthritis(714.0)   . Obesity   . Major depression     with acute psychotic break in 06/2010  . Diabetes mellitus   . Hypertension   . Hyperlipidemia   . Diverticulosis of colon   . COPD (chronic obstructive pulmonary disease)   . Anxiety   . GERD (gastroesophageal reflux disease)   . Vertigo   . Seizures   . Fibromyalgia   . Dermatomyositis   . Myocardial infarct     mulitple (1999, 2000, 2004)  . Raynaud's disease   . Narcotic dependence   . Peripheral neuropathy   . Internal hemorrhoids   . Ischemic heart disease   . Hiatal hernia   . Gastritis   . Diverticulitis   . Hx of adenomatous colonic polyps   . Nephrolithiasis   . Anemia   . Esophageal stricture   . Esophageal dysmotility   . Dermatomyositis   . Paroxysmal a-fib   . Urge incontinence   . Otosclerosis   . Bipolar 1 disorder   . OCD (obsessive compulsive disorder)    Past Surgical History:  Past Surgical History  Procedure Laterality Date  . Knee arthroscopy w/ meniscal repair  2009    left  . Lumbar disc surgery    . Squamous papilloma   2010    removed by Dr. Pollyann Kennedy ENT, noted on tongue  . Esophagogastroduodenoscopy N/A 09/27/2012    Procedure: ESOPHAGOGASTRODUODENOSCOPY (EGD);  Surgeon: Hart Carwin, MD;  Location: [facility];  Service: Endoscopy;  Laterality: N/A;  . Colonoscopy N/A 09/27/2012    Procedure: COLONOSCOPY;  Surgeon: Hart Carwin, MD;  Location: [facility];  Service: Endoscopy;  Laterality: N/A;   HPI:  62 y.o. year old male presenting with altered mental state after being found down on floor. PMH is extensive and significant for Major depression with anxiety, psychosis, bipolar disorder, seizure disorder, HTN,  A.fib, COPD, dermatomyositis, MI (multiple) and narcotic dependence with non-compliance to medical management, GERD, gastritis, hiatal hernia, esopahgeal stricutre and dysmotility, OCD.  Head CT Unchanged appearance from prior study. Chronic involutional change with no acute traumatic injury.  CXR Bibasilar atelectasis.    Assessment / Plan / Recommendation Clinical Impression  Swallow assessment completed with pt. demonstrating significant cognitive/physiatric impairments.  Mild oral dysphagia with delayed oral prep and transit.  Pt. states he has dentures, however unable to determine if he wears while eating.  No physiological deficits exhibited with pharyngeal swallow function.  Cognitive impairments and history of esophageal dysphagia place him at slightly higher aspiration risk.  Recommend he initiate Dys 2 diet and thin liquids, straws ok, pills with liquid and full supervision.  ST will follow.     Aspiration Risk  Moderate    Diet Recommendation Dysphagia 2 (Fine chop);Thin liquid   Liquid Administration via: Cup;Straw Medication Administration: Whole meds with liquid Supervision: Full supervision/cueing for compensatory strategies;Patient able to self feed Compensations: Slow rate;Small sips/bites Postural Changes and/or Swallow Maneuvers: Seated upright 90 degrees;Upright 30-60 min after meal    Other  Recommendations Oral Care Recommendations: Oral care QID   Follow Up Recommendations  24 hour supervision/assistance    Frequency and Duration min 1 x/week  2 weeks   Pertinent Vitals/Pain none    SLP Swallow Goals Patient  will utilize recommended strategies during swallow to increase swallowing safety with: Moderate cueing   Swallow Study Prior Functional Status  Type of Home: House       Oral/Motor/Sensory Function Overall Oral Motor/Sensory Function:  (no overt weakness or decr ROM)   Ice Chips Ice chips: Within functional limits   Thin Liquid Thin Liquid: Within  functional limits Presentation: Straw    Nectar Thick Nectar Thick Liquid: Not tested   Honey Thick Honey Thick Liquid: Not tested   Puree Puree: Within functional limits   Solid       Solid: Impaired Oral Phase Impairments: Reduced lingual movement/coordination Oral Phase Functional Implications:  (delayed transit)       Royce Macadamia M.Ed [employer] [phone]  10/17/2012

## 2012-10-17 NOTE — Evaluation (Signed)
Occupational Therapy Evaluation Patient Details Name: Patrick Holt MRN: [id number] DOB: [birth year] Today's Date: 10/17/2012 Time: 1535-1601 OT Time Calculation (min): 26 min  OT Assessment / Plan / Recommendation History of present illness Briefly, 62 yo M with very extensive PMH including mutliple prior psychotic breaks, bipolar disorder, HTN, questionable seizure disorder, atrial fibrillation not an anticoagulant candidate, dermatomyositis for which he has been taking Methotrexate for many years, CAD, and issues with non-compliance who presents with altered mental status.  Patient unable to relate reason for hospitalization either last night or this AM.  No overt complaints this AM.  Per ED, patient was down for multiple days in his apartment and "anonymous caller" reported this to EMS   Clinical Impression   Pt admitted with the above.  Pt presents to OT with significant  Cognitive deficits which impede his ability to perform BADLs.  Pt requires mod A- total A with all BADLs due to inability to sustain attention to task.  Pt. Would not sustain standing to get OOB - states his symptoms are returning and immediately sits.  He will benefit from continued OT to maximize safety and independence with BADLs. Recommend SNF    OT Assessment  Patient needs continued OT Services    Follow Up Recommendations  SNF    Barriers to Discharge Decreased caregiver support    Equipment Recommendations  None recommended by OT    Recommendations for Other Services    Frequency  Min 2X/week    Precautions / Restrictions Precautions Precautions: Fall Restrictions Weight Bearing Restrictions: No   Pertinent Vitals/Pain     ADL  Eating/Feeding: Independent Where Assessed - Eating/Feeding: Bed level Grooming: Wash/dry hands;Wash/dry face;Brushing hair;Moderate assistance Where Assessed - Grooming: Supported sitting Upper Body Bathing: Maximal assistance Where Assessed - Upper Body Bathing:  Unsupported sitting;Supported sitting Lower Body Bathing: Maximal assistance Where Assessed - Lower Body Bathing: Supported sit to stand Upper Body Dressing: Maximal assistance Where Assessed - Upper Body Dressing: Unsupported sitting Lower Body Dressing: +1 Total assistance Where Assessed - Lower Body Dressing: Supported sit to Pharmacist, hospital: +1 Total assistance (unable due to pt would not maintain standing) Toilet Transfer Method: Sit to stand Transfers/Ambulation Related to ADLs: Attempted to move sit to stand x 4, pt would not stand stating he couldn't due to his symptoms returning.  he would partially stand, then abrubtly sit. ADL Comments: Pt uanble to sustain attention to complete ADL tasks    OT Diagnosis: Generalized weakness;Cognitive deficits  OT Problem List: Decreased strength;Decreased activity tolerance;Impaired balance (sitting and/or standing);Decreased cognition OT Treatment Interventions: Self-care/ADL training;DME and/or AE instruction;Therapeutic activities;Patient/family education;Balance training;Cognitive remediation/compensation   OT Goals(Current goals can be found in the care plan section) Acute Rehab OT Goals Patient Stated Goal: pt unable to state OT Goal Formulation: Patient unable to participate in goal setting Time For Goal Achievement: 10/31/12 Potential to Achieve Goals: Fair ADL Goals Pt Will Perform Grooming: with supervision;standing Pt Will Perform Upper Body Bathing: with min assist;sitting Pt Will Perform Lower Body Bathing: with min assist;sit to/from stand Pt Will Transfer to Toilet: with supervision;regular height toilet;ambulating  Visit Information  Last OT Received On: 10/17/12 Assistance Needed: +1 History of Present Illness: Briefly, 62 yo M with very extensive PMH including mutliple prior psychotic breaks, bipolar disorder, HTN, questionable seizure disorder, atrial fibrillation not an anticoagulant candidate, dermatomyositis for  which he has been taking Methotrexate for many years, CAD, and issues with non-compliance who presents with altered mental status.  Patient unable to  relate reason for hospitalization either last night or this AM.  No overt complaints this AM.  Per ED, patient was down for multiple days in his apartment and "anonymous caller" reported this to EMS       Prior Functioning     Home Living Family/patient expects to be discharged to:: Private residence Living Arrangements: Alone Type of Home: House Home Access: Ramped entrance Home Layout: One level Home Equipment: Electric scooter;Cane - single point;Walker - 2 wheels Additional Comments: pt unable to provide clear PLOF and information taken from prior admission Prior Function Level of Independence: Independent Comments: pt notes using power scooter when outside of home.  Only warms up meals in microwave.  pt notes he uses a cab to go to grocery store.   Communication Communication: No difficulties Dominant Hand: Right         Vision/Perception     Cognition  Cognition Arousal/Alertness: Awake/alert Behavior During Therapy: Restless;Impulsive Overall Cognitive Status: Impaired/Different from baseline Area of Impairment: Orientation;Attention;Safety/judgement;Following commands;Memory;Awareness;Problem solving Orientation Level: Disoriented to;Situation;Place Current Attention Level: Focused;Sustained (variable) Memory: Decreased short-term memory Following Commands: Follows one step commands inconsistently Safety/Judgement: Decreased awareness of safety Problem Solving: Decreased initiation;Requires verbal cues;Requires tactile cues;Difficulty sequencing General Comments: Pt very confused and confabulatory.  Pt self distracts and unable to maintain attention to complete simple tasks.  Pt perseverating that his symptoms have returned, that he needs his Santel, and that his food needs to be warmed    Extremity/Trunk Assessment  Upper Extremity Assessment Upper Extremity Assessment: Overall WFL for tasks assessed Lower Extremity Assessment Lower Extremity Assessment: Defer to PT evaluation Cervical / Trunk Assessment Cervical / Trunk Assessment: Normal     Mobility Bed Mobility Bed Mobility: Supine to Sit;Sitting - Scoot to [place] of Bed;Scooting to HOB Supine to Sit: 4: Min assist;With rails;HOB elevated Sitting - Scoot to [place] of Bed: 4: Min guard Scooting to [facility]: 3: Mod assist Details for Bed Mobility Assistance: max cuing and encouragement, due to pt insisting that he is unable to perform Transfers Transfers: Sit to Stand;Stand to Sit Details for Transfer Assistance: Pt would not move to sitting despite four attempts, pt would not sustain standing position.  Pt moved into partial stand, then immediately sat saying his symptoms were returning. .       Exercise     Balance     End of Session OT - End of Session Activity Tolerance: Other (comment) (cogntive issues) Patient left: in bed;with call bell/phone within reach;with bed alarm set Nurse Communication: Mobility status  GO     Jeani Hawking M 10/17/2012, 4:35 PM

## 2012-10-17 NOTE — Assessment & Plan Note (Signed)
Patient has been off of methotrexate for approximately 6 months.  He has refused to go to rheumatology in town an additional is apparently not welcome and multiple rheumatologic practices in [place].  After discussing with the patient in he does report that things are getting significantly worse again and he is afraid he is going to end up back in his wheelchair.  After discussing with the patient our options I'm hesitant but agreed to prescribe short course of methotrexate as long as patient is agreeable to close followup for lap monitoring given he's been on this for quite some time in the past and tolerated it well.

## 2012-10-17 NOTE — Progress Notes (Signed)
FMTS Attending  Patient was discussed with the resident team. I agree with the resident assessment and plan.   Altered mental status/Encephalopathy - likely due to psychiatric cause and noncompliance with psych. medications. Other possible etiologies include infection/metabolic/intoxication/seizures, I agree with workup as outlined in the resident note, appreciate psychiatric input  EKG changes/?Hx. afib/CAD - troponin negative X3, agree with cardiology consult, continue current dose of amiodarione, appreciate cardiology input on need for anticoagulation

## 2012-10-17 NOTE — Consult Note (Signed)
CARDIOLOGY CONSULT NOTE   Patient ID: Patrick Holt MRN: [id number] DOB/AGE: 1950-10-11 62 y.o.  Admit date: 10/16/2012  Primary Physician   Gaspar Bidding, DO Primary Cardiologist   PR Reason for Consultation   PAF, is amiodarone the best drug; abnl ECG  WGN:FAOZHY Patrick Holt is a 62 y.o. male with a history of PAF. He was admitted 9/1 with AMS. He became combative last pm and required sedation. During all of this, he was in SR or ST. Today, he has been seen by psychiatry and IM. He was continued on the Ativan, but is getting agitated again. He went into atrial fibrillation, RVR and cardiology was asked to evaluate him.   Patrick Holt has complaints including wanting ginger ale, a microwave and other things. He feels his Parkinson's has come back and wants meds for this. He does not answer questions well and his answers do not indicate he is aware of an irregular heart rate. Compliance with medications prior to admission is not assured. He is not having chest pain or SOB. He does not appear to be in acute physical discomfort in any way.  Past Medical History  Diagnosis Date  . Rheumatoid arthritis(714.0)   . Obesity   . Major depression     with acute psychotic break in 06/2010  . Diabetes mellitus   . Hypertension   . Hyperlipidemia   . Diverticulosis of colon   . COPD (chronic obstructive pulmonary disease)   . Anxiety   . GERD (gastroesophageal reflux disease)   . Vertigo   . Seizures   . Fibromyalgia   . Dermatomyositis   . Myocardial infarct     mulitple (1999, 2000, 2004)  . Raynaud's disease   . Narcotic dependence   . Peripheral neuropathy   . Internal hemorrhoids   . Ischemic heart disease   . Hiatal hernia   . Gastritis   . Diverticulitis   . Hx of adenomatous colonic polyps   . Nephrolithiasis   . Anemia   . Esophageal stricture   . Esophageal dysmotility   . Dermatomyositis   . Paroxysmal a-fib   . Urge incontinence   . Otosclerosis   . Bipolar 1  disorder   . OCD (obsessive compulsive disorder)      Past Surgical History  Procedure Laterality Date  . Knee arthroscopy w/ meniscal repair  2009    left  . Lumbar disc surgery    . Squamous papilloma   2010    removed by Dr. Pollyann Kennedy ENT, noted on tongue  . Esophagogastroduodenoscopy N/A 09/27/2012    Procedure: ESOPHAGOGASTRODUODENOSCOPY (EGD);  Surgeon: Hart Carwin, MD;  Location: [facility];  Service: Endoscopy;  Laterality: N/A;  . Colonoscopy N/A 09/27/2012    Procedure: COLONOSCOPY;  Surgeon: Hart Carwin, MD;  Location: [facility];  Service: Endoscopy;  Laterality: N/A;    Allergies  Allergen Reactions  . Betamethasone Dipropionate     Unknown  . Bupropion Hcl     Unknown  . Ciprofloxacin     REACTION: swelling  . Clobetasol     Unknown  . Codeine     Unknown  . Escitalopram Oxalate     Unknown  . Flagyl [Metronidazole]     REACTION: swelling  . Fluoxetine Hcl     Unknown  . Fluticasone-Salmeterol     Unknown  . Furosemide     Unknown  . Immune Globulins     Acute renal failure  . Paroxetine  Unknown  . Penicillins     Unknown  . Statins     Unknown  . Sulfa Antibiotics   . Tacrolimus     Unknown  . Tetanus Toxoid     Unknown  . Tuberculin Purified Protein Derivative     Unknown    I have reviewed the patient's current medications . amiodarone  200 mg Oral QAC breakfast  . aspirin EC  81 mg Oral Daily  . aztreonam  1 g Intravenous Q8H  . folic acid  1 mg Oral Daily  . heparin  5,000 Units Subcutaneous Q8H  . levETIRAcetam  500 mg Oral BID  . multivitamin with minerals  1 tablet Oral Daily  . pantoprazole  40 mg Oral Daily  . potassium chloride  40 mEq Oral BID  . sodium chloride  3 mL Intravenous Q12H  . thiamine  100 mg Oral Daily   Or  . thiamine  100 mg Intravenous Daily  . vancomycin  750 mg Intravenous Q12H   . sodium chloride 200 mL/hr at 10/17/12 0919   LORazepam, LORazepam, LORazepam, nicotine  Prior to Admission  medications   Medication Sig Start Date End Date Taking? Authorizing Provider  ALPRAZolam Prudy Feeler) 1 MG tablet Take 1 tablet (1 mg total) by mouth 3 (three) times daily as needed for sleep or anxiety. 10/06/12   Andrena Mews, DO  amiodarone (PACERONE) 200 MG tablet Take 1 tablet (200 mg total) by mouth daily before breakfast. 08/04/12   Andrena Mews, DO  aspirin EC 81 MG tablet Take 81 mg by mouth daily.    Historical Provider, MD  collagenase (SANTYL) ointment Apply 1 application topically daily. 09/08/12   Andrena Mews, DO  esomeprazole (NEXIUM) 40 MG capsule Take 1 capsule (40 mg total) by mouth daily before breakfast. 06/29/12   Andrena Mews, DO  Ipratropium-Albuterol (COMBIVENT) 20-100 MCG/ACT AERS respimat Inhale 1 puff into the lungs every 6 (six) hours. 02/23/12   Andrena Mews, DO  methotrexate (RHEUMATREX) 2.5 MG tablet Take 1 tablet (2.5 mg total) by mouth See admin instructions. 3 times a week 10/11/12   Andrena Mews, DO  nitroGLYCERIN (NITROSTAT) 0.4 MG SL tablet Place 0.4 mg under the tongue every 5 (five) minutes as needed. For chest pain 02/25/12   Andrena Mews, DO  oxybutynin (DITROPAN) 5 MG tablet Take 1 tablet (5 mg total) by mouth 3 (three) times daily. 06/29/12 06/29/13  Andrena Mews, DO  potassium chloride SA (K-DUR,KLOR-CON) 20 MEQ tablet Take 2 tablets daily x 5 days 10/02/12   Meredith Pel, NP  rOPINIRole (REQUIP) 2 MG tablet Take 1 tablet (2 mg total) by mouth 3 (three) times daily. 07/19/12   Andrena Mews, DO  torsemide (DEMADEX) 20 MG tablet Take 1 tablet (20 mg total) by mouth daily. 09/08/12   Andrena Mews, DO   06/26/2012 Home med list ALPRAZolam Prudy Feeler) 1 MG tablet   Take 1 mg by mouth 3 (three) times daily as needed for sleep.  amiodarone (PACERONE) 200 MG tablet   Take 200 mg by mouth daily before breakfast.   aspirin EC 81 MG tablet   Take 81 mg by mouth daily.   collagenase (SANTYL) ointment   Apply 1 application topically daily.    diclofenac (VOLTAREN) 75 MG EC tablet divalproex (DEPAKOTE) 500 MG DR tablet   Take 1 tablet (500 mg total) by mouth every 12 (twelve) hours.  esomeprazole (NEXIUM) 40 MG capsule  Take 40 mg by mouth daily before breakfast.  Ipratropium-Albuterol (COMBIVENT) 20-100 MCG/ACT AERS respimat   Inhale 1 puff into the lungs every 6 (six) hours.   magnesium oxide (MAG-OX) 400 MG tablet   Take 400 mg by mouth daily.  methotrexate (RHEUMATREX) 2.5 MG tablet niacin (NIASPAN) 1000 MG CR tablet   Take 1 tablet (1,000 mg total) by mouth at bedtime.  nitroGLYCERIN (NITROSTAT) 0.4 MG SL tablet   Place 0.4 mg under the tongue every 5 (five) minutes as needed. oxybutynin (DITROPAN) 5 MG tablet   Take 1 tablet (5 mg total) by mouth 3 (three) times daily.  QUEtiapine (SEROQUEL) 200 MG tablet   Take 1 tablet (200 mg total) by mouth at bedtime.Marland Kitchen  rOPINIRole (REQUIP) 2 MG tablet   Take 2 mg by mouth 3 (three) times daily.   torsemide (DEMADEX) 10 MG tablet   Take 10 mg by mouth daily before breakfast.   [DISCONTINUED] oxybutynin (DITROPAN) 5 MG tablet   Take 1 tablet (5 mg total) by mouth 3 (three) times daily.       History   Social History  . Marital Status: Divorced    Spouse Name: N/A    Number of Children: N/A  . Years of Education: N/A   Occupational History  . disabled    Social History Main Topics  . Smoking status: Current Every Day Smoker -- 0.50 packs/day    Types: Cigarettes  . Smokeless tobacco: Never Used     Comment: PATIENT STATES HE QUIT SMOKING LAST WINTER   . Alcohol Use: No     Comment: daily  . Drug Use: No  . Sexual Activity: No   Other Topics Concern  . Not on file   Social History Narrative  . No narrative on file    Family Status  Relation Status Death Age  . Mother Deceased 67    Brain tumor   Family History  Problem Relation Age of Onset  . Alcohol abuse Mother   . Depression Mother   . Heart disease Mother   . Diabetes Mother   . Stroke Mother    . Diabetes Other     1/2 brother  . Hepatitis Brother      ROS:  Full 14 point review of systems complete and found to be negative unless listed above.  Physical Exam: Blood pressure 111/61, pulse 73, temperature 97.8 F (36.6 C), temperature source Oral, resp. rate 17, height 5\' 4"  (1.626 m), weight 188 lb 15 oz (85.7 kg), SpO2 96.00%.  General: Well developed, well nourished, male, agitated but not in acute physical pain. Head: Eyes PERRLA, No xanthomas.   Normocephalic and atraumatic, oropharynx without edema or exudate. Dentition: poor.  Lungs: clear anteriorly Heart: Heart irregular rate and rhythm with S1, S2; 2/6 systolic murmur best heard at the right upper sternal border, pulses are 2+ all 4 extrem.   Neck: No carotid bruits. No lymphadenopathy.  JVD not elevated. Abdomen: Bowel sounds present, abdomen soft and non-tender without masses or hernias noted. Msk:  No spine or cva tenderness. No weakness, no joint deformities or effusions. Extremities: No clubbing or cyanosis. no edema.  Neuro: Alert and oriented X 3. No focal deficits noted. Psych:  The patient is mildly agitated and doesn't answer appropriately  Skin: No rashes noted. He has several superficial wounds in different areas, no signs of infection in any.  Labs:   Lab Results  Component Value Date   WBC 13.6* 10/17/2012   HGB  15.5 10/17/2012   HCT 44.5 10/17/2012   MCV 92.3 10/17/2012   PLT 341 10/17/2012     Recent Labs Lab 10/17/12 0347  NA 145  K 3.1*  CL 108  CO2 27  BUN 30*  CREATININE 1.09  CALCIUM 8.9  PROT 6.6  BILITOT 1.6*  ALKPHOS 77  ALT 50  AST 92*  GLUCOSE 93    Recent Labs  10/16/12 1627 10/16/12 2100 10/17/12 0347 10/17/12 1325  CKTOTAL 4390*  --   --  1871*  TROPONINI <0.30 <0.30 <0.30 <0.30   Pro B Natriuretic peptide (BNP)  Date/Time Value Range Status  07/15/2009  6:30 AM 294.0* 0.0 - 100.0 pg/mL Final   Lab Results  Component Value Date   CHOL 179 05/15/2010   HDL 44  05/15/2010   LDLCALC 101* 05/15/2010   TRIG 172* 05/15/2010   TSH  Date/Time Value Range Status  06/26/2012  2:18 PM 0.41  0.35 - 5.50 uIU/mL Final   Echo: 02/06/2012 Study Conclusions - Left ventricle: The cavity size was normal. Wall thickness was increased in a pattern of mild LVH. Systolic function was vigorous. The estimated ejection fraction was in the range of 65% to 70%. Wall motion was normal; there were no regional wall motion abnormalities. Doppler parameters are consistent with abnormal left ventricular relaxation (grade 1 diastolic dysfunction). - Aortic valve: There was very mild stenosis. Valve area: 1.51cm^2(VTI). Valve area: 1.42cm^2 (Vmax). - Mitral valve: Calcified annulus. - Left atrium: The atrium was mildly dilated. - Atrial septum: No defect or patent foramen ovale was identified.  ECG:  10/17/2012 SR Vent. rate 75 BPM PR interval 156 ms QRS duration 82 ms QT/QTc 490/547 ms P-R-T axes 48 5 66  ECG from 11/22/2011 SR Vent. rate 62 BPM PR interval 168 ms QRS duration 90 ms QT/QTc 424/431 ms P-R-T axes 50 5 33  Radiology:  Dg Chest 2 View 10/16/2012   CLINICAL DATA:  Altered mental status.  EXAM: CHEST  2 VIEW  COMPARISON:  07/24/2012  FINDINGS: Heart is normal size. Minimal bibasilar atelectasis. No effusions or acute bony abnormality.  IMPRESSION: Bibasilar atelectasis.   Electronically Signed   By: Charlett Nose   On: 10/16/2012 16:22   Ct Cervical Spine Wo Contrast 10/16/2012   *RADIOLOGY REPORT*  Clinical Data:  CT HEAD WITHOUT CONTRAST CT CERVICAL SPINE WITHOUT CONTRAST  Technique:  Multidetector CT imaging of the head and cervical spine was performed following the standard protocol without intravenous contrast.  Multiplanar CT image reconstructions of the cervical spine were also generated.  Comparison: 07/24/12  Findings: No displaced skull fractures.  There is patchy bilateral white matter low attenuation with no evidence of vascular territory infarct.  No  hemorrhage extra-axial fluid.  Stable diffuse atrophy. No hydrocephalus.  IMPRESSION:  Unchanged appearance from prior study.  Chronic involutional change with no acute traumatic injury.    CT CERVICAL SPINE  Findings: Incidental note is made of debris within the pharynx, likely retained secretions.  There is no evidence of cervical spine acute fracture.  There is a chronic corticated fracture of the T1 spinous process.  IMPRESSION: No acute fracture.  Filling defects/debris in the hypopharynx likely related to mucous and retained secretions.  Consider reevaluation to exclude polypoid lesions when the patient's physical condition allows.   Original Report Authenticated By: Esperanza Heir, M.D.   ASSESSMENT AND PLAN:   The patient was seen today by Dr. Delton See, the patient evaluated and the data reviewed.   PAF (paroxysmal  atrial fibrillation) - pt clearly in SR on admission, has gone into PAF today. No way to tell if this happens as an outpatient or if he is compliant with amiodarone. Can get a level but that takes several days. His QTc is prolonged, but there is concern that he is taking extra psych meds, which can also cause a prolonged QT. Continue to follow, but would not stop amiodarone. Will recheck a TSH, LFTs are only mildly abnormal and CXR is OK. Will add Toprol XL 25 mg and see how tolerated.   Abnormal ECG - his ECG has changes from 07/05/2012 ECG. No history of ischemic symptoms and the patient does not complain of chest pain. Check echo, follow for symptoms.   Otherwise, per IM, Psych MDs. Active Problems:   DIABETES MELLITUS, TYPE II   HYPERTENSION   Hypokalemia   Psychiatric issues      Signed: Theodore Demark, PA-C 10/17/2012 4:53 PM Beeper [phone]  The patient was seen, examined and discussed with Theodore Demark, PA-C. I agree with the above note. The patient was consulted for episode of A-fib with RVR on telemetry and found to have prolonged QT and ST depressions in the V2-4  when compared to the prior ECG from 11/22/2011. He was also found to be hypokalemic. The patient is a poor historian with multiple psychiatric disorder and we are unable to verify the compliance with his medications. At this point we will continue Amiodarone, add betablocker and repeat ECG once potassium is replaced. He is not a candidate for a cardioversion considering h/o falls and subdural hematoma. We will wait for a repeat TTE and decide for further step based on the result. He is currently not a candidate for a stress test as he would require sedation for the test and he is asymptomatic. We will follow in the am.   Co-Sign MD  Lars Masson, MD

## 2012-10-17 NOTE — Progress Notes (Signed)
Family Medicine Teaching Service PCP NOTE Service Pager: [phone]  MALCOLM QUAST 62 y.o. male  MRN: [id number]  DOB: 1951/01/01   CODE STATUS: Full   BRIEF PATIENT OVERVIEW   LOS: 1 day  62 y.o. year old male who is well-known to me from clinic however my grasp on his complete medical history still eludes me due to the complexity and difficulty obtaining history from [place].  He seems to significantly worse than when I saw him in clinic last week.  He is even more tangential, withdrawn, having abnormal movements, and difficulty expressing his needs.  He continues to perseverative which is his normal for his baseline however seems to be taken to an elevated level and his focus seems to be on inconsequential things.  And concern that he may be having an acute psychotic break versus a central neurologic process such as a focal seizure.  Past medical history is complex and I have tried to work on cleaning of his problem list in past medical history and will continue to do so prior to his discharge.  Unfortunately with the extent of his allergies and he is past medications it has always been unclear as to what type of reactions he had and whether or not they were truly associated with any of the medications were were just coincidental from his underlying medical conditions.    I appreciate the excellent care from the family medicine teaching service and will defer to them for continued management.  Appreciate cardiology and neurology evaluating and making recommendations for this extremely complex patient.   Andrena Mews, DO [facility] Resident - PGY-2 10/17/2012 1:46 PM

## 2012-10-17 NOTE — Consult Note (Signed)
Reason for Consult: Capacity evaluation and off of psych medication Referring Physician: Wenda Low, MD   Patrick Holt is an 62 y.o. male.  HPI: Patient is seen and chart reviewed. Patient is a poor historian. He has been hyperactive, irritable, distracted, and over focussed or ruminated on his food and drink, needs more frequent redirection. Patrick Holt is a 62 y.o. year old male admitted to [facility] cone medical floor with altered mental state after being found down on floor. Reportedly patient has extensive and significant history of mental illness and poor compliance with his treatment. He was primarily received his care from primary care physician [name] who provided xanax for anxiety and possibly cymbalta. Patient is a poor historian and he has no family that available. Reportedly his ex wife mother infrequently provide groceries and he does not care for himself and does not get along with his providers and people around him. He is known for non compliance. Please see his recent psychiatric evaluation on [facility] out patient clinic for more details.   MSE: Patient is awake, alert but has limited orientation. He has poor grooming and increased psychomotor activity. He has unhappy mood and affect is non congruent and inappropriate. He has tangential and circumstantial speech. His thought process is difficult to follow, easily distracted and ruminated about his food and drink. He has poor cognitions and lack of insight, judgment and impulse control.   Past Medical History  Diagnosis Date  . Rheumatoid arthritis(714.0)   . Obesity   . Major depression     with acute psychotic break in 06/2010  . Diabetes mellitus   . Hypertension   . Hyperlipidemia   . Diverticulosis of colon   . COPD (chronic obstructive pulmonary disease)   . Anxiety   . GERD (gastroesophageal reflux disease)   . Vertigo   . Seizures   . Fibromyalgia   . Dermatomyositis   . Myocardial infarct     mulitple (1999,  2000, 2004)  . Raynaud's disease   . Narcotic dependence   . Peripheral neuropathy   . Internal hemorrhoids   . Ischemic heart disease   . Hiatal hernia   . Gastritis   . Diverticulitis   . Hx of adenomatous colonic polyps   . Nephrolithiasis   . Anemia   . Esophageal stricture   . Esophageal dysmotility   . Dermatomyositis   . Paroxysmal a-fib   . Urge incontinence   . Otosclerosis   . Bipolar 1 disorder   . OCD (obsessive compulsive disorder)     Past Surgical History  Procedure Laterality Date  . Knee arthroscopy w/ meniscal repair  2009    left  . Lumbar disc surgery    . Squamous papilloma   2010    removed by Dr. Pollyann Kennedy ENT, noted on tongue  . Esophagogastroduodenoscopy N/A 09/27/2012    Procedure: ESOPHAGOGASTRODUODENOSCOPY (EGD);  Surgeon: Hart Carwin, MD;  Location: [facility];  Service: Endoscopy;  Laterality: N/A;  . Colonoscopy N/A 09/27/2012    Procedure: COLONOSCOPY;  Surgeon: Hart Carwin, MD;  Location: [facility];  Service: Endoscopy;  Laterality: N/A;    Family History  Problem Relation Age of Onset  . Alcohol abuse Mother   . Depression Mother   . Heart disease Mother   . Diabetes Mother   . Stroke Mother   . Diabetes Other     1/2 brother    Social History:  reports that he has been smoking Cigarettes.  He has been smoking about 0.50 packs per day. He has never used smokeless tobacco. He reports that he does not drink alcohol or use illicit drugs.  Allergies:  Allergies  Allergen Reactions  . Betamethasone Dipropionate     Unknown  . Bupropion Hcl     Unknown  . Ciprofloxacin     REACTION: swelling  . Clobetasol     Unknown  . Codeine     Unknown  . Escitalopram Oxalate     Unknown  . Flagyl [Metronidazole]     REACTION: swelling  . Fluoxetine Hcl     Unknown  . Fluticasone-Salmeterol     Unknown  . Furosemide     Unknown  . Immune Globulins     Acute renal failure  . Paroxetine     Unknown  . Penicillins     Unknown   . Statins     Unknown  . Sulfa Antibiotics   . Tacrolimus     Unknown  . Tetanus Toxoid     Unknown  . Tuberculin Purified Protein Derivative     Unknown    Medications: I have reviewed the patient's current medications.  Results for orders placed during the hospital encounter of 10/16/12 (from the past 48 hour(s))  CK     Status: Abnormal   Collection Time    10/16/12  4:27 PM      Result Value Range   Total CK 4390 (*) 7 - 232 U/L  CBC WITH DIFFERENTIAL     Status: Abnormal   Collection Time    10/16/12  4:27 PM      Result Value Range   WBC 17.4 (*) 4.0 - 10.5 K/uL   RBC 5.38  4.22 - 5.81 MIL/uL   Hemoglobin 16.9  13.0 - 17.0 g/dL   HCT 16.1  09.6 - 04.5 %   MCV 91.8  78.0 - 100.0 fL   MCH 31.4  26.0 - 34.0 pg   MCHC 34.2  30.0 - 36.0 g/dL   RDW 40.9 (*) 81.1 - 91.4 %   Platelets 385  150 - 400 K/uL   Neutrophils Relative % 82 (*) 43 - 77 %   Neutro Abs 14.2 (*) 1.7 - 7.7 K/uL   Lymphocytes Relative 9 (*) 12 - 46 %   Lymphs Abs 1.5  0.7 - 4.0 K/uL   Monocytes Relative 10  3 - 12 %   Monocytes Absolute 1.7 (*) 0.1 - 1.0 K/uL   Eosinophils Relative 0  0 - 5 %   Eosinophils Absolute 0.0  0.0 - 0.7 K/uL   Basophils Relative 0  0 - 1 %   Basophils Absolute 0.0  0.0 - 0.1 K/uL  COMPREHENSIVE METABOLIC PANEL     Status: Abnormal   Collection Time    10/16/12  4:27 PM      Result Value Range   Sodium 142  135 - 145 mEq/L   Potassium 3.0 (*) 3.5 - 5.1 mEq/L   Chloride 100  96 - 112 mEq/L   CO2 23  19 - 32 mEq/L   Glucose, Bld 101 (*) 70 - 99 mg/dL   BUN 41 (*) 6 - 23 mg/dL   Creatinine, Ser 7.82 (*) 0.50 - 1.35 mg/dL   Calcium 9.6  8.4 - 95.6 mg/dL   Total Protein 8.0  6.0 - 8.3 g/dL   Albumin 4.1  3.5 - 5.2 g/dL   AST 213 (*) 0 - 37 U/L   ALT 55 (*)  0 - 53 U/L   Alkaline Phosphatase 93  39 - 117 U/L   Total Bilirubin 1.7 (*) 0.3 - 1.2 mg/dL   GFR calc non Af Amer 47 (*) >90 mL/min   GFR calc Af Amer 54 (*) >90 mL/min   Comment: (NOTE)     The eGFR has been  calculated using the CKD EPI equation.     This calculation has not been validated in all clinical situations.     eGFR's persistently <90 mL/min signify possible Chronic Kidney     Disease.  TROPONIN I     Status: None   Collection Time    10/16/12  4:27 PM      Result Value Range   Troponin I <0.30  <0.30 ng/mL   Comment:            Due to the release kinetics of cTnI,     a negative result within the first hours     of the onset of symptoms does not rule out     myocardial infarction with certainty.     If myocardial infarction is still suspected,     repeat the test at appropriate intervals.  ACETAMINOPHEN LEVEL     Status: None   Collection Time    10/16/12  4:27 PM      Result Value Range   Acetaminophen (Tylenol), Serum <15.0  10 - 30 ug/mL   Comment:            THERAPEUTIC CONCENTRATIONS VARY     SIGNIFICANTLY. A RANGE OF 10-30     ug/mL MAY BE AN EFFECTIVE     CONCENTRATION FOR MANY PATIENTS.     HOWEVER, SOME ARE BEST TREATED     AT CONCENTRATIONS OUTSIDE THIS     RANGE.     ACETAMINOPHEN CONCENTRATIONS     >150 ug/mL AT 4 HOURS AFTER     INGESTION AND >50 ug/mL AT 12     HOURS AFTER INGESTION ARE     OFTEN ASSOCIATED WITH TOXIC     REACTIONS.  SALICYLATE LEVEL     Status: Abnormal   Collection Time    10/16/12  4:27 PM      Result Value Range   Salicylate Lvl <2.0 (*) 2.8 - 20.0 mg/dL  ETHANOL     Status: None   Collection Time    10/16/12  4:27 PM      Result Value Range   Alcohol, Ethyl (B) <11  0 - 11 mg/dL   Comment:            LOWEST DETECTABLE LIMIT FOR     SERUM ALCOHOL IS 11 mg/dL     FOR MEDICAL PURPOSES ONLY  POCT I-STAT, CHEM 8     Status: Abnormal   Collection Time    10/16/12  4:46 PM      Result Value Range   Sodium 145  135 - 145 mEq/L   Potassium 3.0 (*) 3.5 - 5.1 mEq/L   Chloride 105  96 - 112 mEq/L   BUN 40 (*) 6 - 23 mg/dL   Creatinine, Ser 4.09 (*) 0.50 - 1.35 mg/dL   Glucose, Bld 811 (*) 70 - 99 mg/dL   Calcium, Ion 9.14 (*)  1.13 - 1.30 mmol/L   TCO2 26  0 - 100 mmol/L   Hemoglobin 18.7 (*) 13.0 - 17.0 g/dL   HCT 78.2 (*) 95.6 - 21.3 %  CG4 I-STAT (LACTIC ACID)     Status:  None   Collection Time    10/16/12  4:46 PM      Result Value Range   Lactic Acid, Venous 1.59  0.5 - 2.2 mmol/L  URINALYSIS, ROUTINE W REFLEX MICROSCOPIC     Status: Abnormal   Collection Time    10/16/12  6:18 PM      Result Value Range   Color, Urine AMBER (*) YELLOW   Comment: BIOCHEMICALS MAY BE AFFECTED BY COLOR   APPearance CLEAR  CLEAR   Specific Gravity, Urine 1.023  1.005 - 1.030   pH 5.5  5.0 - 8.0   Glucose, UA NEGATIVE  NEGATIVE mg/dL   Hgb urine dipstick LARGE (*) NEGATIVE   Bilirubin Urine MODERATE (*) NEGATIVE   Ketones, ur 40 (*) NEGATIVE mg/dL   Protein, ur 30 (*) NEGATIVE mg/dL   Urobilinogen, UA 1.0  0.0 - 1.0 mg/dL   Nitrite NEGATIVE  NEGATIVE   Leukocytes, UA NEGATIVE  NEGATIVE  URINE RAPID DRUG SCREEN (HOSP PERFORMED)     Status: None   Collection Time    10/16/12  6:18 PM      Result Value Range   Opiates NONE DETECTED  NONE DETECTED   Cocaine NONE DETECTED  NONE DETECTED   Benzodiazepines NONE DETECTED  NONE DETECTED   Amphetamines NONE DETECTED  NONE DETECTED   Tetrahydrocannabinol NONE DETECTED  NONE DETECTED   Barbiturates NONE DETECTED  NONE DETECTED   Comment:            DRUG SCREEN FOR MEDICAL PURPOSES     ONLY.  IF CONFIRMATION IS NEEDED     FOR ANY PURPOSE, NOTIFY LAB     WITHIN 5 DAYS.                LOWEST DETECTABLE LIMITS     FOR URINE DRUG SCREEN     Drug Class       Cutoff (ng/mL)     Amphetamine      1000     Barbiturate      200     Benzodiazepine   200     Tricyclics       300     Opiates          300     Cocaine          300     THC              50  URINE MICROSCOPIC-ADD ON     Status: Abnormal   Collection Time    10/16/12  6:18 PM      Result Value Range   Squamous Epithelial / LPF FEW (*) RARE   WBC, UA 0-2  <3 WBC/hpf   RBC / HPF 0-2  <3 RBC/hpf   Bacteria, UA FEW  (*) RARE   Casts HYALINE CASTS (*) NEGATIVE   Urine-Other MUCOUS PRESENT    TROPONIN I     Status: None   Collection Time    10/16/12  9:00 PM      Result Value Range   Troponin I <0.30  <0.30 ng/mL   Comment:            Due to the release kinetics of cTnI,     a negative result within the first hours     of the onset of symptoms does not rule out     myocardial infarction with certainty.     If myocardial infarction is still suspected,  repeat the test at appropriate intervals.  CBC     Status: Abnormal   Collection Time    10/16/12 10:00 PM      Result Value Range   WBC 14.5 (*) 4.0 - 10.5 K/uL   RBC 5.03  4.22 - 5.81 MIL/uL   Hemoglobin 16.2  13.0 - 17.0 g/dL   HCT 47.8  29.5 - 62.1 %   MCV 92.0  78.0 - 100.0 fL   MCH 32.2  26.0 - 34.0 pg   MCHC 35.0  30.0 - 36.0 g/dL   RDW 30.8 (*) 65.7 - 84.6 %   Platelets 328  150 - 400 K/uL  CREATININE, SERUM     Status: Abnormal   Collection Time    10/16/12 10:00 PM      Result Value Range   Creatinine, Ser 1.27  0.50 - 1.35 mg/dL   GFR calc non Af Amer 58 (*) >90 mL/min   GFR calc Af Amer 68 (*) >90 mL/min   Comment: (NOTE)     The eGFR has been calculated using the CKD EPI equation.     This calculation has not been validated in all clinical situations.     eGFR's persistently <90 mL/min signify possible Chronic Kidney     Disease.  AMMONIA     Status: None   Collection Time    10/16/12 10:00 PM      Result Value Range   Ammonia 29  11 - 60 umol/L  HIV ANTIBODY (ROUTINE TESTING)     Status: None   Collection Time    10/16/12 10:00 PM      Result Value Range   HIV NON REACTIVE  NON REACTIVE   Comment: Performed at [facility]  RPR     Status: None   Collection Time    10/16/12 10:00 PM      Result Value Range   RPR NON REACTIVE  NON REACTIVE   Comment: Performed at [facility]  GLUCOSE, CAPILLARY     Status: None   Collection Time    10/17/12  2:06 AM      Result Value Range    Glucose-Capillary 83  70 - 99 mg/dL  MRSA PCR SCREENING     Status: None   Collection Time    10/17/12  2:16 AM      Result Value Range   MRSA by PCR NEGATIVE  NEGATIVE   Comment:            The GeneXpert MRSA Assay (FDA     approved for NASAL specimens     only), is one component of a     comprehensive MRSA colonization     surveillance program. It is not     intended to diagnose MRSA     infection nor to guide or     monitor treatment for     MRSA infections.  CBC     Status: Abnormal   Collection Time    10/17/12  3:47 AM      Result Value Range   WBC 13.6 (*) 4.0 - 10.5 K/uL   RBC 4.82  4.22 - 5.81 MIL/uL   Hemoglobin 15.5  13.0 - 17.0 g/dL   HCT 96.2  95.2 - 84.1 %   MCV 92.3  78.0 - 100.0 fL   MCH 32.2  26.0 - 34.0 pg   MCHC 34.8  30.0 - 36.0 g/dL   RDW 32.4 (*) 40.1 - 02.7 %  Platelets 341  150 - 400 K/uL  COMPREHENSIVE METABOLIC PANEL     Status: Abnormal   Collection Time    10/17/12  3:47 AM      Result Value Range   Sodium 145  135 - 145 mEq/L   Potassium 3.1 (*) 3.5 - 5.1 mEq/L   Chloride 108  96 - 112 mEq/L   CO2 27  19 - 32 mEq/L   Glucose, Bld 93  70 - 99 mg/dL   BUN 30 (*) 6 - 23 mg/dL   Creatinine, Ser 2.84  0.50 - 1.35 mg/dL   Calcium 8.9  8.4 - 13.2 mg/dL   Total Protein 6.6  6.0 - 8.3 g/dL   Albumin 3.3 (*) 3.5 - 5.2 g/dL   AST 92 (*) 0 - 37 U/L   ALT 50  0 - 53 U/L   Alkaline Phosphatase 77  39 - 117 U/L   Total Bilirubin 1.6 (*) 0.3 - 1.2 mg/dL   GFR calc non Af Amer 70 (*) >90 mL/min   GFR calc Af Amer 82 (*) >90 mL/min   Comment: (NOTE)     The eGFR has been calculated using the CKD EPI equation.     This calculation has not been validated in all clinical situations.     eGFR's persistently <90 mL/min signify possible Chronic Kidney     Disease.  TROPONIN I     Status: None   Collection Time    10/17/12  3:47 AM      Result Value Range   Troponin I <0.30  <0.30 ng/mL   Comment:            Due to the release kinetics of cTnI,     a  negative result within the first hours     of the onset of symptoms does not rule out     myocardial infarction with certainty.     If myocardial infarction is still suspected,     repeat the test at appropriate intervals.  GLUCOSE, CAPILLARY     Status: None   Collection Time    10/17/12  4:15 AM      Result Value Range   Glucose-Capillary 85  70 - 99 mg/dL  TROPONIN I     Status: None   Collection Time    10/17/12  1:25 PM      Result Value Range   Troponin I <0.30  <0.30 ng/mL   Comment:            Due to the release kinetics of cTnI,     a negative result within the first hours     of the onset of symptoms does not rule out     myocardial infarction with certainty.     If myocardial infarction is still suspected,     repeat the test at appropriate intervals.  CK     Status: Abnormal   Collection Time    10/17/12  1:25 PM      Result Value Range   Total CK 1871 (*) 7 - 232 U/L  MAGNESIUM     Status: None   Collection Time    10/17/12  1:25 PM      Result Value Range   Magnesium 2.5  1.5 - 2.5 mg/dL    Dg Chest 2 View  05/19/100   CLINICAL DATA:  Altered mental status.  EXAM: CHEST  2 VIEW  COMPARISON:  07/24/2012  FINDINGS: Heart is normal size. Minimal  bibasilar atelectasis. No effusions or acute bony abnormality.  IMPRESSION: Bibasilar atelectasis.   Electronically Signed   By: Charlett Nose   On: 10/16/2012 16:22   Ct Head Wo Contrast  10/16/2012   *RADIOLOGY REPORT*  Clinical Data:  CT HEAD WITHOUT CONTRAST CT CERVICAL SPINE WITHOUT CONTRAST  Technique:  Multidetector CT imaging of the head and cervical spine was performed following the standard protocol without intravenous contrast.  Multiplanar CT image reconstructions of the cervical spine were also generated.  Comparison: 07/24/12  Findings: No displaced skull fractures.  There is patchy bilateral white matter low attenuation with no evidence of vascular territory infarct.  No hemorrhage extra-axial fluid.  Stable diffuse  atrophy. No hydrocephalus.  IMPRESSION:  Unchanged appearance from prior study.  Chronic involutional change with no acute traumatic injury.  CT CERVICAL SPINE  Findings: Incidental note is made of debris within the pharynx, likely retained secretions.  There is no evidence of cervical spine acute fracture.  There is a chronic corticated fracture of the T1 spinous process.  IMPRESSION: No acute fracture.  Filling defects/debris in the hypopharynx likely related to mucous and retained secretions.  Consider reevaluation to exclude polypoid lesions when the patient's physical condition allows.   Original Report Authenticated By: Esperanza Heir, M.D.   Ct Cervical Spine Wo Contrast  10/16/2012   *RADIOLOGY REPORT*  Clinical Data:  CT HEAD WITHOUT CONTRAST CT CERVICAL SPINE WITHOUT CONTRAST  Technique:  Multidetector CT imaging of the head and cervical spine was performed following the standard protocol without intravenous contrast.  Multiplanar CT image reconstructions of the cervical spine were also generated.  Comparison: 07/24/12  Findings: No displaced skull fractures.  There is patchy bilateral white matter low attenuation with no evidence of vascular territory infarct.  No hemorrhage extra-axial fluid.  Stable diffuse atrophy. No hydrocephalus.  IMPRESSION:  Unchanged appearance from prior study.  Chronic involutional change with no acute traumatic injury.  CT CERVICAL SPINE  Findings: Incidental note is made of debris within the pharynx, likely retained secretions.  There is no evidence of cervical spine acute fracture.  There is a chronic corticated fracture of the T1 spinous process.  IMPRESSION: No acute fracture.  Filling defects/debris in the hypopharynx likely related to mucous and retained secretions.  Consider reevaluation to exclude polypoid lesions when the patient's physical condition allows.   Original Report Authenticated By: Esperanza Heir, M.D.    Positive for aggressive behavior, anxiety, bad  mood, behavior problems, learning difficulty, mood swings, sleep disturbance and medication non compliace Blood pressure 111/61, pulse 73, temperature 97.8 F (36.6 C), temperature source Oral, resp. rate 17, height 5\' 4"  (1.626 m), weight 85.7 kg (188 lb 15 oz), SpO2 96.00%.   Assessment/Plan: Depressive disorder NOS Anxiety disorder NOS R/O Bipolar disorder  Recommendation: Case discussed patient staff RN and primary physician on call. Patient does not meet criteria for capacity to make his own medical decision and living arrangement based on my evaluation. Patient benefit from SNF with psychiatric services available Contact psych social service regarding guardianship and placement needs as required Will start Depakote 500 mg BID for manic symptoms  Start Risperidone 0.5 mg BID for agitation or aggressive behaviors.  Appreciate psych consult and will follow up as clinically required  Zaire Vanbuskirk,JANARDHAHA R. 10/17/2012, 4:05 PM

## 2012-10-17 NOTE — Telephone Encounter (Signed)
Patient is in the hospital today, labs will be monitored while he was there.

## 2012-10-17 NOTE — Progress Notes (Signed)
Interim note: Paged by nursing staff. Mr. Fournet woke up and was combative, thrashing in bed, yelling loudly and banging his head of the bed rales. With markedly elevated CIWA score. They called security to help in calming him down. He continued to ramble about snakes, not addressing the majority of the questions asked. He did not admit to pain or discomfort. Patient was alert to person and place, but not time. Still unaware of surrounding issues that lead to his hospital admission.   BP was unable to be obtained. However telemetry showed HR 97 and NSR. His actions caused both elbows and nose lacerations to bleed that were previously scabbed over. Patient was cleaned up and bandages applied. Mittens are applied to his hands bilaterally.   A/P: - Transfer to stepdown unit for close supervision - Continue to monitor on CIWA - Spoke with pharmacist about ativan dosage; adjusted dose to be 2 mg every 15 minutes until calm with Max dose of 6 mg, may repeat every 2 hours to keep patient calm.   - haldol is not an option for him d/t is prolonged Qtc - Continuous telemetry and O2 monitoring.   Kuneff, Renee DO

## 2012-10-17 NOTE — Progress Notes (Signed)
Clinical Social Work Department BRIEF PSYCHOSOCIAL ASSESSMENT 10/17/2012  Patient:  Patrick Holt, Patrick Holt     Account Number:  [id number]     Admit date:  10/16/2012  Clinical Social Worker:  Harless Nakayama  Date/Time:  10/17/2012 02:45 PM  Referred by:  Physician  Date Referred:  10/17/2012 Referred for  SNF Placement   Other Referral:   Interview type:  Other - See comment Other interview type:   Pt disoriented and unable to complete assessment at this time. CSW spoke with pt nurse and family medicine CSW.    PSYCHOSOCIAL DATA Living Status:  ALONE Admitted from facility:   Level of care:   Primary support name:   Primary support relationship to patient:   Degree of support available:   Pt appears to have no known support system.    CURRENT CONCERNS Current Concerns  Post-Acute Placement   Other Concerns:    SOCIAL WORK ASSESSMENT / PLAN CSW informed pt may need SNF placement if inpatient psych is not appropriate. CSW attempted to complete assessment with pt but pt is disoriented at this time. CSW spoke with pt nurse who reported that pt has no support. Family medicine CSW confirmed pt limited support system. Family medicine CSW is very familiar with pt and reported that pt has been noncompliant in the past. CSW will await psych note and continue to follow.   Assessment/plan status:  Psychosocial Support/Ongoing Assessment of Needs Other assessment/ plan:   Information/referral to community resources:   None needed at this time    PATIENT'S/FAMILY'S RESPONSE TO PLAN OF CARE: Pt very disoriented while CSW attempted to conduct assessment.       Jeanny Rymer, LCSWA [phone]

## 2012-10-17 NOTE — Progress Notes (Addendum)
Pt.woke up yelling ,combative ,banging his head on siderails,oriented to self ,disoriented to time,& talking nonsense.Unable to calm him down.Security was called  To calm him down,but still continues to talk nopnsense.V/S taken b/p=130/72;hr=82;Ciwa score is 35;;Dr.DO was called & made aware & came to see pt.Ordered to give additional Ativan 1 mg  IV.in addition to 0.5mg .given earlier.& ordered to transfer to stepdown.Will continue to monitor pt.

## 2012-10-17 NOTE — Assessment & Plan Note (Signed)
Will recheck today. Consider Spironolactone but this is likely not an option given his waxing and waning nutritional status and mental status.

## 2012-10-17 NOTE — Progress Notes (Signed)
Family Medicine Teaching Service Daily Progress Note Intern Pager: [phone]  Patient name: Patrick Holt Medical record number: [id number] Date of birth: [birth year] Age: 62 y.o. Gender: male  Primary Care Provider: Gaspar Bidding, DO Consultants: Psych Code Status: Presumed full, unable to address d/t AMS  Pt Overview and Major Events to Date:   9/1: AMS; CK (4390); Cr (1.53); BUN (41); K (3); EKG: Prolonged QTc w/ ST depression; UDS neg  Assessment and Plan: Patrick Holt is a 62 y.o. year old male presenting with altered mental state after being found down on floor. PMH is extensive and significant for Major depression with anxiety, psychosis, bipolar disorder, seizure disorder, HTN, A.fib, COPD, dermatomyositis, MI (multiple) and narcotic dependence with non-compliance to medical management.   # Altered mental status  Patient has extensive psych history with Major depressive disorder, anxiety, bipolar d/o and psychosis. Per PCP and [facility] notes, it appears patient removed himself of all medications over the last year. He has been prescribed Seroquel, Depakote and Cymbalta in the past. Recent refill on Xanax (#90), although negative for benzo on UDS today.  Uncertain etiology of AMS   Can not rule out infectious causes: leukocytosis of 17K (Neuts 84%); CXR resulted with bibasilar atelectasis. UA: Neg for Leuks & Nit  Azetreonam and Vanc started 9/1  Urine and blood cultures pending.  Drawn after Abx started; Will narrow antibiotics as soon as possible.   Does not appear encephalopathic on exam.  HIV, RPR: Non Reactive  WBC: trending down 13.6 (9/2)  Psych Consulted   Pt [facility] in [facility]  CNS  Seizures?  Previous Hx of seizures: current off meds per last PCP visit  Neurology Consulted  Likely need EEG  Structural Unlikely  CT of head: No acute processes or hydrocephalus. Patchy bilateral white matter low attenuation with no evidence of vascular  territory infarct. Stable diffuse atrophy.   Drugs/Toxins?  UDS normal; No benzo.  Ethanol, Salicylate, Acetaminophen: wnl  CIWA protocol: given unknown alcohol consumption   Receiving Thiamine & Folate  Uncertain if he took his antipsychotics recently?  Cardiac: Hx of Afib and MI x 3   EKG: Prolonged QTc 547, St depression in anterior and lateral leads noted  Repeat EKG: NSR, Qtc 609, St depression in anterior and lateral leads noted  Troponin: neg x 3  Metabolic Unlikely  Initial CMP: Mild hypokalemia (3); AST/ALT slightly elevated, Gluc, Ns, Ca & Bicarb wnl  Ammonia: wnl  TSH (06/26/12): wnl  # Hypokalemia:  Recent colonoscopy on the 14th. Patient was asked to hold demedex and take potassium supplement.   Potassium: initial 3.0    Today 3.1: KDUR: Ordered oral x 2 doses.   Mg: wnl  Continued to hold demadex for now. Watch for fluid overload. Weight 211.9 on admission, and is extremely dry.   # Elevated CK: Likely from fall. Will continue to monitor.   CK 4390 Initial  Received 1 bolus of NS in ED; then 1.5 MIVF on floor.   CK (9/2): 1871  # A.fib  Currently NSR  Last Echo 12/13, mild LVH. Systolic function was vigorous. EF 65% to 70%. Wall motion was normal; there were no regional wall motion abnormalities. Grade 1 diastolic dysfunction. Mild AS. MV calcified. Left atrium mildly dilated.  Continuing Amiodarone 200 mg QD  ASA Daily  Consult Cardiology  Previously seen by [facility] Cardiology [phone])  Consult questions:   Should we continue Amiodarone given noncompliance and lack of follow-up/monitoring  Does  pt require ACE &/or BB?; BP currently 111/61 HR 77; However has PHx of MI x 3  # Seizure disorder:   Patient previously on Keppra. Has been non-compliant with medication. Uncertain if altered mental status/syncope is from seizure. CK is elevated, but likely from early rhabdomyolysis from being down for 3 days.   Consult Neurology:  likely will need EEG   Keppra 500 mg BID ordered while inpatient   Uncertain if patient will be compliant on Keppra given his history.   FEN/GI:   Speech Eval Performed  Diet: Dysphagia 2 (Fine chop);Thin liquid   MIVF: 26mL/hr   Will monitor and dec. based on hydration status  Continue home PPI  Prophylaxis: Hep SQ  Consults  PT, OT, SW  Disposition: Pending neurology and psychiatry evaluations  Subjective: Patrick Holt continues to be  seems to be improving today. He is expressing desire to eat, and concern for "return of his symptoms" (while moving his feet and head in circles).   Objective: Temp:  [97.8 F (36.6 C)-99.9 F (37.7 C)] 97.8 F (36.6 C) (09/02 0815) Pulse Rate:  [77-88] 82 (09/02 0600) Resp:  [11-29] 23 (09/02 0600) BP: (121-155)/(60-130) 131/104 mmHg (09/02 0600) SpO2:  [86 %-98 %] 95 % (09/02 0600) Weight:  [188 lb 15 oz (85.7 kg)-211 lb 9.6 oz (95.981 kg)] 188 lb 15 oz (85.7 kg) (09/02 0200)  Physical Exam: General/Psych: Alert. Oriented to person, place, and time but not situation. Repeat himself. Unable to answer questions due to tangential thoughts. Psychomotor mild agitation. Makes good eye contact.  Neuro: Patient not extremely cooperative, but appears CN grossly intact Unable to access gait HEENT: [place]. Dry mucous membranes. Nose with abrasion and erythema on external nasal bridge. Bilateral nares patent, without discharge. Cardiovascular: RRR.   Respiratory: CTAB, normal resp effort Abdomen: Soft, NTND. BS present. No masses palpated.  Extremities: No erythema or edema. Strength 5/5 bilateral UE/LE. Normal ROM bilaterally.  Skin: Multiple contusions and scrapes (old) over arms, shins and nose.   Laboratory:  Recent Labs Lab 10/16/12 1627 10/16/12 1646 10/16/12 2200 10/17/12 0347  WBC 17.4*  --  14.5* 13.6*  HGB 16.9 18.7* 16.2 15.5  HCT 49.4 55.0* 46.3 44.5  PLT 385  --  328 341    Recent Labs Lab 10/16/12 1627 10/16/12 1646  10/16/12 2200 10/17/12 0347  NA 142 145  --  145  K 3.0* 3.0*  --  3.1*  CL 100 105  --  108  CO2 23  --   --  27  BUN 41* 40*  --  30*  CREATININE 1.53* 1.60* 1.27 1.09  CALCIUM 9.6  --   --  8.9  PROT 8.0  --   --  6.6  BILITOT 1.7*  --   --  1.6*  ALKPHOS 93  --   --  77  ALT 55*  --   --  50  AST 114*  --   --  92*  GLUCOSE 101* 101*  --  93   Imaging/Diagnostic Tests: CXR: Heart is normal size. Minimal bibasilar atelectasis. No effusions or acute bony abnormality.  CT Head wo contrast: Unchanged appearance from prior study. Chronic involutional change with no acute traumatic injury.  CT CERVICAL SPINE: Incidental note is made of debris within the pharynx, likely retained secretions. There is no evidence of cervical spine acute fracture. There is a chronic corticated fracture of the T1 spinous process. IMPRESSION: No acute fracture. Filling defects/debris in the hypopharynx likely related  to mucous and retained secretions. Consider reevaluation to exclude polypoid lesions when the patient's physical condition allows. .  EKG: Prolonged QTc, St depression in anterior and lateral leads noted.    Wenda Low, MD 10/17/2012, 9:01 AM PGY-1, [facility] Family Medicine FPTS Intern pager: [phone], text pages welcome
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
## 2012-10-17 NOTE — Assessment & Plan Note (Signed)
Stable/Well Controlled - no changes at this time. 

## 2012-10-18 ENCOUNTER — Inpatient Hospital Stay (HOSPITAL_COMMUNITY): Payer: Medicare HMO

## 2012-10-18 DIAGNOSIS — I369 Nonrheumatic tricuspid valve disorder, unspecified: Secondary | ICD-10-CM

## 2012-10-18 DIAGNOSIS — F319 Bipolar disorder, unspecified: Secondary | ICD-10-CM

## 2012-10-18 LAB — BASIC METABOLIC PANEL
GFR calc Af Amer: >90 mL/min (ref >90)
GFR calc non Af Amer: >90 mL/min (ref >90)
Potassium: 3.6 mEq/L (ref 3.5–5.1)
Sodium: 142 mEq/L (ref 135–145)

## 2012-10-18 LAB — GLUCOSE, CAPILLARY: Glucose-Capillary: 85 mg/dL (ref 70–99)

## 2012-10-18 LAB — CBC WITH DIFFERENTIAL/PLATELET
Basophils Absolute: 0 10*3/uL (ref 0.0–0.1)
Eosinophils Relative: 1 % (ref 0–5)
Lymphocytes Relative: 27 % (ref 12–46)
Neutro Abs: 3.9 10*3/uL (ref 1.7–7.7)
Platelets: 288 10*3/uL (ref 150–400)
RDW: 17.6 % — ABNORMAL HIGH (ref 11.5–15.5)
WBC: 6.5 10*3/uL (ref 4.0–10.5)

## 2012-10-18 LAB — URINE CULTURE: Culture: NO GROWTH

## 2012-10-18 MED ORDER — LOPERAMIDE HCL 1 MG/5ML PO LIQD
1.0000 mg | ORAL | Status: DC | PRN
  Filled 2012-10-18: qty 5

## 2012-10-18 NOTE — Progress Notes (Signed)
Echocardiogram 2D Echocardiogram has been performed.  Dorothey Baseman 10/18/2012, 1:18 PM

## 2012-10-18 NOTE — Progress Notes (Signed)
Physical Therapy Treatment Patient Details Name: Patrick Holt MRN: [id number] DOB: 1950-09-26 Today's Date: 10/18/2012 Time: 2956-2130 PT Time Calculation (min): 28 min  PT Assessment / Plan / Recommendation  History of Present Illness Briefly, 62 yo M with very extensive PMH including mutliple prior psychotic breaks, bipolar disorder, HTN, questionable seizure disorder, atrial fibrillation not an anticoagulant candidate, dermatomyositis for which he has been taking Methotrexate for many years, CAD, and issues with non-compliance who presents with altered mental status.  Patient unable to relate reason for hospitalization either last night or this AM.  No overt complaints this AM.  Per ED, patient was down for multiple days in his apartment and "anonymous caller" reported this to EMS   PT Comments   PT session limited today by multiple episodes of urinary and bowel incontinence. Pt remains very impulsive and unsafe with mobility as well as being unable to keep self adequately clean. Recommend SNF at d/c for 24 hr supervision. PT will continue to follow.   Follow Up Recommendations  Supervision/Assistance - 24 hour;SNF     Does the patient have the potential to tolerate intense rehabilitation     Barriers to Discharge        Equipment Recommendations  None recommended by PT    Recommendations for Other Services OT consult  Frequency Min 3X/week   Progress towards PT Goals Progress towards PT goals: Progressing toward goals  Plan Current plan remains appropriate    Precautions / Restrictions Precautions Precautions: Fall Restrictions Weight Bearing Restrictions: No   Pertinent Vitals/Pain No pain    Mobility  Bed Mobility Bed Mobility: Supine to Sit;Sit to Supine Supine to Sit: 4: Min assist;HOB flat;With rails Sitting - Scoot to Edge of Bed: 5: Supervision Sit to Supine: 4: Min guard Details for Bed Mobility Assistance: needed min A to push away from flat bed but was able  to get self back into bed without physical assist Transfers Transfers: Sit to Stand;Stand to Sit Sit to Stand: From bed;From toilet;4: Min assist Stand to Sit: 4: Min assist;To toilet;To bed Details for Transfer Assistance: min A to steady and pt very impulsive. Performed sit to stand multiple times from bed and toilet for cleanup Ambulation/Gait Ambulation/Gait Assistance: 4: Min assist Ambulation Distance (Feet): 25 Feet Assistive device: Other (Comment) (pushed IV pole) Ambulation/Gait Assistance Details: could not advance ambulation today because pt had multiple episodes of urinating when up. RN placed condom cath but he then pulled it off, would not leave penis alone throughout session. Min A to steady Gait Pattern: Step-through pattern;Decreased stride length;Narrow base of support Stairs: No Wheelchair Mobility Wheelchair Mobility: No    Exercises     PT Diagnosis:    PT Problem List:   PT Treatment Interventions:     PT Goals (current goals can now be found in the care plan section) Acute Rehab PT Goals Patient Stated Goal: pt unable to state PT Goal Formulation: With patient Time For Goal Achievement: 10/31/12 Potential to Achieve Goals: Fair  Visit Information  Last PT Received On: 10/18/12 Assistance Needed: +1 History of Present Illness: Briefly, 62 yo M with very extensive PMH including mutliple prior psychotic breaks, bipolar disorder, HTN, questionable seizure disorder, atrial fibrillation not an anticoagulant candidate, dermatomyositis for which he has been taking Methotrexate for many years, CAD, and issues with non-compliance who presents with altered mental status.  Patient unable to relate reason for hospitalization either last night or this AM.  No overt complaints this AM.  Per  ED, patient was down for multiple days in his apartment and "anonymous caller" reported this to EMS    Subjective Data  Subjective: pt fixated on urinating and bowel mvmts Patient  Stated Goal: pt unable to state   Cognition  Cognition Arousal/Alertness: Awake/alert Behavior During Therapy: Restless;Impulsive Overall Cognitive Status: No family/caregiver present to determine baseline cognitive functioning Area of Impairment: Orientation;Attention;Safety/judgement;Following commands;Memory;Awareness;Problem solving Orientation Level: Disoriented to;Situation Current Attention Level: Focused Memory: Decreased short-term memory Following Commands: Follows one step commands inconsistently Safety/Judgement: Decreased awareness of safety Problem Solving: Requires verbal cues;Requires tactile cues;Difficulty sequencing General Comments: pt continues to be confused, today very focused on excretion and needing his depends    Balance  Balance Balance Assessed: Yes Static Sitting Balance Static Sitting - Balance Support: Feet supported Static Sitting - Level of Assistance: 5: Stand by assistance Static Sitting - Comment/# of Minutes: 10 Dynamic Standing Balance Dynamic Standing - Balance Support: During functional activity;Right upper extremity supported Dynamic Standing - Level of Assistance: 4: Min assist  End of Session PT - End of Session Equipment Utilized During Treatment: Gait belt Activity Tolerance: Other (comment) (limited due to bowel/ bladder issues) Patient left: in bed;with call bell/phone within reach;Other (comment) (going to xray) Nurse Communication: Mobility status   GP   Lyanne Co, PT  Acute Rehab Services  [phone]   Lyanne Co 10/18/2012, 4:38 PM

## 2012-10-18 NOTE — Clinical Social Work Psych Assess (Signed)
Clinical Social Work Department CLINICAL SOCIAL WORK PSYCHIATRY SERVICE LINE ASSESSMENT 10/18/2012  Patient:  Patrick Holt  Account:  [id number]  Admit Date:  10/16/2012  Clinical Social Worker:  Vonita Moss, Theresia Majors  Date/Time:  10/18/2012 11:30 AM Referred by:  Physician  Date referred:  10/18/2012 Reason for Referral  Behavioral Health Issues   Presenting Symptoms/Problems (In the person's/family's own words):   Psych was consulted for capacity; medication management   Abuse/Neglect/Trauma History (check all that apply)  Denies history   Abuse/Neglect/Trauma Comments:   none   Psychiatric History (check all that apply)  Outpatient treatment   Psychiatric medications:  divalproex (DEPAKOTE ER) 24 hr tablet 500 mg  Dose: 500 mg Freq: 2 times daily with meals Route: PO  Start: 10/18/12 0800    LORazepam (ATIVAN) tablet 1 mg  Dose: 1 mg Freq: Every 6 hours PRN Route: PO  PRN Reason: anxiety  PRN Comment: CIWA-AR > 8  -OR-  withdrawal symptoms: anxiety, agitation, insomnia, diaphoresis, nausea, vomiting, tremors, tachycardia, or hypertension.  Start: 10/16/12 1935 End: 10/19/12 1934   Current Mental Health Hospitalizations/Previous Mental Health History:   Pt is very familiar to [facility] both inpatient and ED.   Current provider:   pt reports his PCP is MD Gaspar Bidding, but states that he has not seen him since July   Place and Date:   ongoing   Current Medications:   Scheduled Meds:      . amiodarone  200 mg Oral QAC breakfast  . aspirin EC  81 mg Oral Daily  . aztreonam  1 g Intravenous Q8H  . divalproex  500 mg Oral BID WC  . folic acid  1 mg Oral Daily  . heparin  5,000 Units Subcutaneous Q8H  . levETIRAcetam  500 mg Oral BID  . metoprolol succinate  25 mg Oral Daily  . multivitamin with minerals  1 tablet Oral Daily  . pantoprazole  40 mg Oral Daily  . sodium chloride  3 mL Intravenous Q12H  . thiamine  100 mg Oral Daily   Or     . thiamine   100 mg Intravenous Daily  . vancomycin  750 mg Intravenous Q12H        Continuous Infusions:      . sodium chloride 200 mL/hr at 10/18/12 0954          PRN Meds:.LORazepam, LORazepam, LORazepam, nicotine, risperiDONE       Previous Impatient Admission/Date/Reason:   Pt has had 2 ED visits in the past 6 months and is a patient of the [facility]   Emotional Health / Current Symptoms    Suicide/Self Harm  None reported   Suicide attempt in the past:   none reported or documented in the chart   Other harmful behavior:   none reported or noted in chart   Psychotic/Dissociative Symptoms  Confusion   Other Psychotic/Dissociative Symptoms:   none reported or noted in the chart    Attention/Behavioral Symptoms  Restless  Verbal aggression   Other Attention / Behavioral Symptoms:   no other sx noted in chart    Cognitive Impairment  Orientation - Self  Orientation - Place  Poor Judgement  Poor/Impaired Decision-Making  Recent Memory Impairment   Other Cognitive Impairment:   no other sx reported or noted in chart    Mood and Adjustment  Aggressive/frustrated  Confused  Guarded  Unstable/Inconsistent    Stress, Anxiety, Trauma, Any Recent Loss/Stressor  None  reported   Anxiety (frequency):   none reported or noted in chart   Phobia (specify):   none reported or noted in chart   Compulsive behavior (specify):   none reported or noted in chart   Obsessive behavior (specify):   none reported or noted in chart   Other:   none reported or noted in chart   Substance Abuse/Use  None   SBIRT completed (please refer for detailed history):  N  Self-reported substance use:   none reported or noted in chart   Urinary Drug Screen Completed:  Y Alcohol level:   <11 and UDS nothing detected    Environmental/Housing/Living Arrangement  Stable housing   Who is in the home:   pt reports living alone   Emergency contact:  pt reports no  community supports   Financial  Medicare   Patient's Strengths and Goals (patient's own words):   Pt is at the hospital getting medical attention.  Pt had someone (though the person wishes to remain anonamous) to contact the EMS when he was found on his floor at home.   Clinical Social Worker's Interpretive Summary:   Psych CSW assessed pt.  Pt was alert, but only oriented to self and place.  Pt appeared restless and frustrated.  Pt reports wanting to go home.    Pt was evaulated by the Psychiatrist for capacity.  The psychiatrist has determined that the pt lacks capacity to make healthcare and living arrangement decisions.  Psych CSW agrees with this finding.    Psych CSW made unit CSW aware.  PT is recommending SNF placement.    Psych CSW is signing off.  Please reconsult psych as necessary.   Disposition:  Psych Clinical Social Worker signing off please consult unit CSW for SNF placement needs   Vickii Penna, LCSWA [phone]  Clinical Social Work

## 2012-10-18 NOTE — Progress Notes (Signed)
FMTS Attending Note Patient seen and examined by me, discussed with resident team and I agree with Dr Laban Emperor assess/plan.   Appreciate Psychiatry consult; regarding lack of capacity for medical decisions/discharge planning, will make effort to identify health care power of attorney, involve SW on floor in this effort.   Appreciate Cardiology consult, plan.  Awaiting ECHO results; patient is continuing on amiodarone.  The team's previous concerns regarding patient's inability to administer the amiodarone will largely be alleviated if he is placed in a skilled nursing facility that will give him his meds as prescribed.  JB

## 2012-10-18 NOTE — Progress Notes (Signed)
CSW (Clinical Child psychotherapist) aware pt will be in need of SNF placement at dc. Pt has been referred out to multiple counties. CSW continues to look for friends/family of pt who could serve as contact person. CSW has staffed with supervisor who is now aware of situation and assisting.  Mitchell Iwanicki, LCSWA [phone]

## 2012-10-18 NOTE — Progress Notes (Signed)
Family Medicine Teaching Service Daily Progress Note Intern Pager: [phone]  Patient name: Patrick Holt Medical record number: [id number] Date of birth: 1950-06-06 Age: 62 y.o. Gender: male  Primary Care Provider: Gaspar Bidding, DO Consultants: Psych Code Status: Presumed full, unable to address d/t AMS  Pt Overview and Major Events to Date:   9/1: AMS; CK (4390); Cr (1.53); BUN (41); K (3); EKG: Prolonged QTc w/ ST depression; UDS neg  Assessment and Plan: Patrick Holt is a 62 y.o. year old male presenting with altered mental state after being found down on floor. PMH is extensive and significant for Major depression with anxiety, psychosis, bipolar disorder, seizure disorder, HTN, A.fib, COPD, dermatomyositis, MI (multiple) and narcotic dependence with non-compliance to medical management.   # Altered mental status  Patient has extensive psych history with Major depressive disorder, anxiety, bipolar d/o and psychosis. Per PCP and [facility] notes, it appears patient removed himself of all medications over the last year. He has been prescribed Seroquel, Depakote and Cymbalta in the past. Recent refill on Xanax (#90), although negative for benzo on UDS today.  Uncertain etiology of AMS   Can not rule out infectious causes: leukocytosis of 17K (Neuts 84%); CXR resulted with bibasilar atelectasis. UA: Neg for Leuks & Nit  Azetreonam and Vanc started 9/1  Urine and blood cultures pending.  Drawn after Abx started; Will narrow antibiotics as soon as possible.   Does not appear encephalopathic on exam.  HIV, RPR: Non Reactive  WBC: trending down 13.6 (9/2)  Appreciate psych consult and recommendations:  Pt does not have capacity  Recommend SNF with psych services, psych SW  Start depakote and risperidone  CNS  Seizures?  Continuing Keppra  Previous Hx of seizures: current off meds per last PCP visit  Neurology Consulted  Likely need EEG  Structural  Unlikely  CT of head: No acute processes or hydrocephalus. Patchy bilateral white matter low attenuation with no evidence of vascular territory infarct. Stable diffuse atrophy.   Drugs/Toxins?  UDS normal; No benzo.  Ethanol, Salicylate, Acetaminophen: wnl  CIWA protocol: given unknown alcohol consumption   Receiving Thiamine & Folate  Uncertain if he took his antipsychotics recently?  Cardiac: Hx of Afib and MI x 3   EKG: Prolonged QTc 547, St depression in anterior and lateral leads noted  Repeat EKG: NSR, Qtc 609, St depression in anterior and lateral leads noted  Troponin: neg x 3  Metabolic Unlikely  Initial CMP: Mild hypokalemia (3); AST/ALT slightly elevated, Gluc, Ns, Ca & Bicarb wnl  Ammonia: wnl  TSH (06/26/12): wnl  # Hypokalemia, resolved:  Recent colonoscopy on the 14th. Patient was asked to hold demedex and take potassium supplement.  K+ 3.6 today  Continued to hold demadex for now. Watch for fluid overload. Weight 211.9 on admission, and is extremely dry.   # Elevated CK: Likely from fall. Will continue to monitor.   CK 4390 Initial  Received 1 bolus of NS in ED; then 1.5 MIVF on floor.   CK (9/2): 1871  # A.fib  Currently NSR  Last Echo 12/13, mild LVH. Systolic function was vigorous. EF 65% to 70%. Wall motion was normal; there were no regional wall motion abnormalities. Grade 1 diastolic dysfunction. Mild AS. MV calcified. Left atrium mildly dilated.  Continuing Amiodarone 200 mg QD  ASA Daily  Appreciate Cardiology consult recommendations  Recommend continuing amiodarone, start metoprolol, asa  # Seizure disorder:   Patient previously on Keppra. Has been non-compliant with medication.  Uncertain if altered mental status/syncope is from seizure. CK is elevated, but likely from early rhabdomyolysis from being down for 3 days.   Consult Neurology: likely will need EEG   Keppra 500 mg BID ordered while inpatient   Uncertain if patient  will be compliant on Keppra given his history.   FEN/GI:   Speech Eval Performed  Diet: Dysphagia 2 (Fine chop);Thin liquid   MIVF: decrease to 181mL/hr   Will monitor and dec. based on hydration status  Continue home PPI  Prophylaxis: Hep SQ  Consults  PT, OT, SW  Disposition: Pending neurology and psychiatry evaluations  Subjective:  Patient more alert today, eating breakfast. Says he only has pain in neck and lower back, says this is a chronic issue. He is very tangential during interview, saying repeatedly he needs his meds and that the nurses have not given them to him, he needs his phone to call [employer] to go to his house to bring him his meds.   Objective: Temp:  [97.8 F (36.6 C)-98.9 F (37.2 C)] 98.9 F (37.2 C) (09/03 0503) Pulse Rate:  [62-77] 62 (09/03 0503) Resp:  [15-18] 18 (09/03 0503) BP: (111-147)/(61-88) 147/85 mmHg (09/03 0503) SpO2:  [94 %-97 %] 97 % (09/03 0503)  Physical Exam: General/Psych: A+Ox3. Tangential thought process HEENT: NCAT Cardiovascular: RRR.  No murmurs Respiratory: CTAB, normal effort Abdomen: Soft, NTND. BS present. No masses palpated.  Extremities: No erythema or edema. Strength 5/5 bilateral UE/LE. Normal ROM bilaterally.  Skin: Multiple contusions and scrapes (old) over arms, shins and nose.  Neuro: CN grossly normal  Laboratory:  Recent Labs Lab 10/16/12 1627 10/16/12 1646 10/16/12 2200 10/17/12 0347  WBC 17.4*  --  14.5* 13.6*  HGB 16.9 18.7* 16.2 15.5  HCT 49.4 55.0* 46.3 44.5  PLT 385  --  328 341    Recent Labs Lab 10/16/12 1627 10/16/12 1646 10/16/12 2200 10/17/12 0347 10/18/12 0400  NA 142 145  --  145 142  K 3.0* 3.0*  --  3.1* 3.6  CL 100 105  --  108 112  CO2 23  --   --  27 23  BUN 41* 40*  --  30* 14  CREATININE 1.53* 1.60* 1.27 1.09 0.74  CALCIUM 9.6  --   --  8.9 8.6  PROT 8.0  --   --  6.6  --   BILITOT 1.7*  --   --  1.6*  --   ALKPHOS 93  --   --  77  --   ALT 55*  --    --  50  --   AST 114*  --   --  92*  --   GLUCOSE 101* 101*  --  93 104*   Imaging/Diagnostic Tests: CXR: Heart is normal size. Minimal bibasilar atelectasis. No effusions or acute bony abnormality.  CT Head wo contrast: Unchanged appearance from prior study. Chronic involutional change with no acute traumatic injury.  CT CERVICAL SPINE: Incidental note is made of debris within the pharynx, likely retained secretions. There is no evidence of cervical spine acute fracture. There is a chronic corticated fracture of the T1 spinous process. IMPRESSION: No acute fracture. Filling defects/debris in the hypopharynx likely related to mucous and retained secretions. Consider reevaluation to exclude polypoid lesions when the patient's physical condition allows. .  EKG: Prolonged QTc, St depression in anterior and lateral leads noted.    Tawni Carnes, MD 10/18/2012, 6:49 AM PGY-1, [facility] FPTS  Intern pager: [phone], text pages welcome

## 2012-10-18 NOTE — Progress Notes (Addendum)
Clinical Social Work Department CLINICAL SOCIAL WORK PLACEMENT NOTE 10/18/2012  Patient:  Patrick Holt, Patrick Holt  Account Number:  [id number] Admit date:  10/16/2012  Clinical Social Worker:  Sharol Harness, Theresia Majors  Date/time:  10/18/2012 12:00 M  Clinical Social Work is seeking post-discharge placement for this patient at the following level of care:   SKILLED NURSING   (*CSW will update this form in Epic as items are completed)   10/18/2012  Patient/family provided with [facility] Department of Clinical Social Work's list of facilities offering this level of care within the geographic area requested by the patient (or if unable, by the patient's family).  10/18/2012  Patient/family informed of their freedom to choose among providers that offer the needed level of care, that participate in Medicare, Medicaid or managed care program needed by the patient, have an available bed and are willing to accept the patient.  10/18/2012  Patient/family informed of [facility]' ownership interest in [facility], as well as of the fact that they are under no obligation to receive care at this facility.  PASARR submitted to EDS on 10/18/2012 PASARR number received from EDS on   FL2 transmitted to all facilities in geographic area requested by pt/family on  10/18/2012 FL2 transmitted to all facilities within larger geographic area on 10/18/2012  Patient informed that his/her managed care company has contracts with or will negotiate with  certain facilities, including the following:     Patient/family informed of bed offers received:  10/19/2012 Patient chooses bed at [facility] Physician recommends and patient chooses bed at    Patient to be transferred [facility]  on  10/20/2012 Patient to be transferred to facility by [facility]  The following physician request were entered in Epic:   Additional Comments:  Marquisha Nikolov, LCSWA [phone]

## 2012-10-18 NOTE — Progress Notes (Signed)
Subjective:  No complaints today. Telemetry shows NSR  Objective:  Vital Signs in the last 24 hours: Temp:  [97.8 F (36.6 C)-98.9 F (37.2 C)] 98.9 F (37.2 C) (09/03 0503) Pulse Rate:  [62-77] 62 (09/03 0503) Resp:  [15-18] 18 (09/03 0503) BP: (111-147)/(61-88) 147/85 mmHg (09/03 0503) SpO2:  [94 %-97 %] 97 % (09/03 0503)  Intake/Output from previous day: 09/02 0701 - 09/03 0700 In: 1736.7 [I.V.:1586.7; IV Piggyback:150] Out: 250 [Urine:250] Intake/Output from this shift:    . amiodarone  200 mg Oral QAC breakfast  . aspirin EC  81 mg Oral Daily  . aztreonam  1 g Intravenous Q8H  . divalproex  500 mg Oral BID WC  . folic acid  1 mg Oral Daily  . heparin  5,000 Units Subcutaneous Q8H  . levETIRAcetam  500 mg Oral BID  . metoprolol succinate  25 mg Oral Daily  . multivitamin with minerals  1 tablet Oral Daily  . pantoprazole  40 mg Oral Daily  . sodium chloride  3 mL Intravenous Q12H  . thiamine  100 mg Oral Daily   Or  . thiamine  100 mg Intravenous Daily  . vancomycin  750 mg Intravenous Q12H   . sodium chloride 200 mL/hr at 10/18/12 1610    Physical Exam: The patient appears to be in no distress.  Head and neck exam reveals that the pupils are equal and reactive.  The extraocular movements are full.  There is no scleral icterus.  Mouth and pharynx are benign.  No lymphadenopathy.  No carotid bruits.  The jugular venous pressure is normal.  Thyroid is not enlarged or tender.  Chest is clear to percussion and auscultation.  No rales or rhonchi.  Expansion of the chest is symmetrical.  Heart reveals no abnormal lift or heave.  First and second heart sounds are normal.  There is no  gallop rub or click. Soft systolic murmur at base.  The abdomen is soft and nontender.  Bowel sounds are normoactive.  There is no hepatosplenomegaly or mass.  There are no abdominal bruits.  Extremities reveal no phlebitis or edema.  Pedal pulses are good.  There is no cyanosis or  clubbing.  Neurologic exam is normal strength and no lateralizing weakness.  No sensory deficits.  Integument reveals no rash  Lab Results:  Recent Labs  10/16/12 2200 10/17/12 0347  WBC 14.5* 13.6*  HGB 16.2 15.5  PLT 328 341    Recent Labs  10/17/12 0347 10/18/12 0400  NA 145 142  K 3.1* 3.6  CL 108 112  CO2 27 23  GLUCOSE 93 104*  BUN 30* 14  CREATININE 1.09 0.74    Recent Labs  10/17/12 0347 10/17/12 1325  TROPONINI <0.30 <0.30   Hepatic Function Panel  Recent Labs  10/17/12 0347  PROT 6.6  ALBUMIN 3.3*  AST 92*  ALT 50  ALKPHOS 77  BILITOT 1.6*   No results found for this basename: CHOL,  in the last 72 hours No results found for this basename: PROTIME,  in the last 72 hours  Imaging: Dg Chest 2 View  10/16/2012   CLINICAL DATA:  Altered mental status.  EXAM: CHEST  2 VIEW  COMPARISON:  07/24/2012  FINDINGS: Heart is normal size. Minimal bibasilar atelectasis. No effusions or acute bony abnormality.  IMPRESSION: Bibasilar atelectasis.   Electronically Signed   By: Charlett Nose   On: 10/16/2012 16:22   Ct Head Wo Contrast  10/16/2012   *  RADIOLOGY REPORT*  Clinical Data:  CT HEAD WITHOUT CONTRAST CT CERVICAL SPINE WITHOUT CONTRAST  Technique:  Multidetector CT imaging of the head and cervical spine was performed following the standard protocol without intravenous contrast.  Multiplanar CT image reconstructions of the cervical spine were also generated.  Comparison: 07/24/12  Findings: No displaced skull fractures.  There is patchy bilateral white matter low attenuation with no evidence of vascular territory infarct.  No hemorrhage extra-axial fluid.  Stable diffuse atrophy. No hydrocephalus.  IMPRESSION:  Unchanged appearance from prior study.  Chronic involutional change with no acute traumatic injury.  CT CERVICAL SPINE  Findings: Incidental note is made of debris within the pharynx, likely retained secretions.  There is no evidence of cervical spine acute  fracture.  There is a chronic corticated fracture of the T1 spinous process.  IMPRESSION: No acute fracture.  Filling defects/debris in the hypopharynx likely related to mucous and retained secretions.  Consider reevaluation to exclude polypoid lesions when the patient's physical condition allows.   Original Report Authenticated By: Esperanza Heir, M.D.   Ct Cervical Spine Wo Contrast  10/16/2012   *RADIOLOGY REPORT*  Clinical Data:  CT HEAD WITHOUT CONTRAST CT CERVICAL SPINE WITHOUT CONTRAST  Technique:  Multidetector CT imaging of the head and cervical spine was performed following the standard protocol without intravenous contrast.  Multiplanar CT image reconstructions of the cervical spine were also generated.  Comparison: 07/24/12  Findings: No displaced skull fractures.  There is patchy bilateral white matter low attenuation with no evidence of vascular territory infarct.  No hemorrhage extra-axial fluid.  Stable diffuse atrophy. No hydrocephalus.  IMPRESSION:  Unchanged appearance from prior study.  Chronic involutional change with no acute traumatic injury.  CT CERVICAL SPINE  Findings: Incidental note is made of debris within the pharynx, likely retained secretions.  There is no evidence of cervical spine acute fracture.  There is a chronic corticated fracture of the T1 spinous process.  IMPRESSION: No acute fracture.  Filling defects/debris in the hypopharynx likely related to mucous and retained secretions.  Consider reevaluation to exclude polypoid lesions when the patient's physical condition allows.   Original Report Authenticated By: Esperanza Heir, M.D.    Cardiac Studies: Telemetry shows NSR Assessment/Plan:  Paroxysmal atrial fibrillation now back in NSR.  Plan: Await echo. Continue Amiodarone, BB, ASA.  LOS: 2 days    Cassell Clement 10/18/2012, 7:50 AM

## 2012-10-18 NOTE — Progress Notes (Signed)
Speech Language Pathology Dysphagia Treatment Patient Details Name: Patrick Holt MRN: [id number] DOB: [birth year] Today's Date: 10/18/2012 Time: 4098-1191 SLP Time Calculation (min): 8 min  Assessment / Plan / Recommendation Clinical Impression  Pt. continues with confusion in addition to baseline psychiatiric diagnoses.  He required moderate visual/verbal cues for purpose of SLP visit, improved positioning and small bites/sips.  Min-mild prolonged oral phase with regular texture (obseved for possible upgrade) and no s/s aspiration with straw sips thin liquid.  Recommend diet texture upgrade to Dys 3 and continue thin with full supervision due to cognitive impairments.  He does not require follow up ST in acute care.  Recommend ST at next venue of care (likely SNF) for upgrade texture to regular.        Diet Recommendation  Initiate / Change Diet: Dysphagia 3 (mechanical soft);Thin liquid    SLP Plan Discharge SLP treatment due to (comment) (no further ST needed in acute care)   Pertinent Vitals/Pain none   Swallowing Goals  SLP Swallowing Goals Patient will utilize recommended strategies during swallow to increase swallowing safety with: Moderate cueing Swallow Study Goal #2 - Progress: Partly met  General Temperature Spikes Noted: No Respiratory Status: Room air Behavior/Cognition: Alert;Cooperative;Confused;Distractible;Requires cueing Oral Cavity - Dentition: Missing dentition  Oral Cavity - Oral Hygiene Does patient have any of the following "at risk" factors?: Lips - dry, cracked;Saliva - thick, dry mouth Brush patient's teeth BID with toothbrush (using toothpaste with fluoride): Yes Patient is AT RISK - Oral Care Protocol followed (see row info): Yes   Dysphagia Treatment Treatment focused on: Skilled observation of diet tolerance;Upgraded PO texture trials Treatment Methods/Modalities: Skilled observation;Differential diagnosis Patient observed directly with PO's:  Yes Type of PO's observed: Thin liquids;Regular Feeding: Able to feed self Liquids provided via: Straw Oral Phase Signs & Symptoms: Prolonged oral phase (min-mild) Type of cueing: Visual;Verbal Amount of cueing: Moderate   GO     Breck Coons [place].Ed [employer] [phone]  10/18/2012

## 2012-10-18 NOTE — Clinical Social Work Psych Note (Signed)
Psych CSW reviewed chart.  Psych CSW evaluated pt (full assessment to follow).    Psychiatry has evaluated and documented "Patient does not meet criteria for capacity to make his own medical decision and living arrangement."  Psych CSW informed unit CSW.  Psych CSW signing off.  Please re-consult as necessary.

## 2012-10-19 LAB — BASIC METABOLIC PANEL
CO2: 24 mEq/L (ref 19–32)
Glucose, Bld: 93 mg/dL (ref 70–99)
Potassium: 3.3 mEq/L — ABNORMAL LOW (ref 3.5–5.1)
Sodium: 146 mEq/L — ABNORMAL HIGH (ref 135–145)

## 2012-10-19 LAB — CBC
HCT: 40.7 % (ref 39.0–52.0)
Hemoglobin: 13 g/dL (ref 13.0–17.0)
RDW: 17.7 % — ABNORMAL HIGH (ref 11.5–15.5)
WBC: 7.5 10*3/uL (ref 4.0–10.5)

## 2012-10-19 MED ORDER — METOPROLOL SUCCINATE 12.5 MG HALF TABLET
12.5000 mg | ORAL_TABLET | Freq: Every day | ORAL | Status: DC
  Administered 2012-10-19 – 2012-10-20 (×2): 12.5 mg via ORAL
  Filled 2012-10-19 (×2): qty 1

## 2012-10-19 MED ORDER — POTASSIUM CHLORIDE CRYS ER 20 MEQ PO TBCR: 40.0000 meq | EXTENDED_RELEASE_TABLET | Freq: Two times a day (BID) | ORAL | Status: DC

## 2012-10-19 MED ORDER — POTASSIUM CHLORIDE CRYS ER 20 MEQ PO TBCR
40.0000 meq | EXTENDED_RELEASE_TABLET | Freq: Once | ORAL | Status: AC
  Administered 2012-10-19: 40 meq via ORAL
  Filled 2012-10-19: qty 2

## 2012-10-19 NOTE — Progress Notes (Signed)
Patient HR repeatedly dropping 30's 40's nonsustained.  MD with family medicine notified.  No new orders given.  Will continue to monitor.

## 2012-10-19 NOTE — Progress Notes (Signed)
Subjective:  No complaints today. Telemetry shows NSR and sinus bradycardia.  Objective:  Vital Signs in the last 24 hours: Temp:  [98.6 F (37 C)-98.8 F (37.1 C)] 98.6 F (37 C) (09/04 0636) Pulse Rate:  [60-87] 60 (09/04 0636) Resp:  [18] 18 (09/04 0636) BP: (143-155)/(71-87) 147/71 mmHg (09/04 0636) SpO2:  [96 %-97 %] 96 % (09/04 0636)  Intake/Output from previous day: 09/03 0701 - 09/04 0700 In: 605 [P.O.:480; I.V.:125] Out: 831 [Urine:825; Stool:6] Intake/Output from this shift:    . amiodarone  200 mg Oral QAC breakfast  . aspirin EC  81 mg Oral Daily  . aztreonam  1 g Intravenous Q8H  . divalproex  500 mg Oral BID WC  . folic acid  1 mg Oral Daily  . heparin  5,000 Units Subcutaneous Q8H  . levETIRAcetam  500 mg Oral BID  . metoprolol succinate  12.5 mg Oral Daily  . multivitamin with minerals  1 tablet Oral Daily  . pantoprazole  40 mg Oral Daily  . sodium chloride  3 mL Intravenous Q12H  . thiamine  100 mg Oral Daily   Or  . thiamine  100 mg Intravenous Daily  . vancomycin  750 mg Intravenous Q12H   . sodium chloride 125 mL/hr at 10/19/12 1610    Physical Exam: The patient appears to be in no distress.  Head and neck exam reveals that the pupils are equal and reactive.  The extraocular movements are full.  There is no scleral icterus.  Mouth and pharynx are benign.  No lymphadenopathy.  No carotid bruits.  The jugular venous pressure is normal.  Thyroid is not enlarged or tender.  Chest is clear to percussion and auscultation.  No rales or rhonchi.  Expansion of the chest is symmetrical.  Heart reveals no abnormal lift or heave.  First and second heart sounds are normal.  There is no  gallop rub or click. Soft systolic murmur at base.  The abdomen is soft and nontender.  Bowel sounds are normoactive.  There is no hepatosplenomegaly or mass.  There are no abdominal bruits.  Extremities reveal no phlebitis or edema.  Pedal pulses are good.  There is no  cyanosis or clubbing.  Neurologic exam is normal strength and no lateralizing weakness.  No sensory deficits.Patient shows confusion and rambling thought processes.  Integument reveals no rash  Lab Results:  Recent Labs  10/18/12 1404 10/19/12 0449  WBC 6.5 7.5  HGB 13.4 13.0  PLT 288 303    Recent Labs  10/18/12 0400 10/19/12 0449  NA 142 146*  K 3.6 3.3*  CL 112 111  CO2 23 24  GLUCOSE 104* 93  BUN 14 10  CREATININE 0.74 0.75    Recent Labs  10/17/12 0347 10/17/12 1325  TROPONINI <0.30 <0.30   Hepatic Function Panel  Recent Labs  10/17/12 0347  PROT 6.6  ALBUMIN 3.3*  AST 92*  ALT 50  ALKPHOS 77  BILITOT 1.6*   No results found for this basename: CHOL,  in the last 72 hours No results found for this basename: PROTIME,  in the last 72 hours  Imaging: Dg Chest 2 View  10/18/2012   *RADIOLOGY REPORT*  Clinical Data: Elevated white blood count.  CHEST - 2 VIEW  Comparison: Chest x-ray dated 10/16/2012  Findings: Heart size and pulmonary vascularity are normal.  The lungs are hyperinflated with flattening of the diaphragm.  Slight blunting of the posterior costophrenic angles could be due to  tiny effusions.  Minimal linear atelectasis at the right lung base.  No consolidative infiltrates.  No acute osseous abnormalities.  IMPRESSION: 1.  Minimal atelectasis at the right lung base. 2.  Possible tiny effusions. 3.  Hyperinflated lungs suggesting emphysema.   Original Report Authenticated By: Francene Boyers, M.D.    Cardiac Studies: Telemetry shows NSR  2D echo shows: ------------------------------------------------------------ Study Conclusions  - Left ventricle: The cavity size was normal. Wall thickness was increased in a pattern of mild LVH. Systolic function was normal. The estimated ejection fraction was in the range of 60% to 65%. Wall motion was normal; there were no regional wall motion abnormalities. - Aortic valve: There was mild stenosis. Valve  area: 1.44cm^2(VTI). Valve area: 1.29cm^2 (Vmax). - Left atrium: The atrium was moderately dilated. - Tricuspid valve: Moderate regurgitation. - Pulmonary arteries: PA peak pressure: 34mm Hg (S).  Assessment/Plan:  Paroxysmal atrial fibrillation now back in NSR.  Plan:  Continue Amiodarone, BB, ASA. No further cardiac workup indicated at this time. DC to SNF when bed available.  LOS: 3 days    Cassell Clement 10/19/2012, 8:50 AM

## 2012-10-19 NOTE — Progress Notes (Signed)
FMTS Attending Note  Seen and examined by me, discussed with resident team and I agree with plan as per resident note.  Agree with plan to reassess competency to make medical and discharge decisions, as he appears much more oriented today than on previous exams.  He states that he is amenable to Assisted Living but not to Skilled Nursing, despite the recommendation for SNF upon discharge to help with medication management. Paula Compton, MD
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
## 2012-10-19 NOTE — Progress Notes (Addendum)
CSW (Clinical Child psychotherapist) and Engineer, technical sales, Patrick Holt, spoke with pt about SNF placement. Pt is agreeable as long as he has a private room available.   CSW (Clinical Child psychotherapist) spoke with Patrick Holt at [facility] and they are able to offer pt a private room. However, pt has been deemed to not have capacity and will need a new psych consult to reevaluate and if he still does not have capacity the guardianship process will need to be started so that pt has someone to sign him in at facility. Facility would be willing to file for guardianship if necessary. CSW spoke with Resident about pt capacity. Resident relayed to CSW that pscyh consult for reevaluation should already be ordered and they are in agreement that pt does have capacity at this time. CSW notified [employer] of chosen facility. CSW faxed additional information (FL2, H&P, 30day note) and still awaiting PASARR.   Patrick Holt, LCSWA [phone]

## 2012-10-19 NOTE — Consult Note (Signed)
Reason for Consult: Capacity evaluation and off of psych medication Referring Physician: Wenda Low, MD   Patrick Holt is an 62 y.o. male.  HPI: Patient is seen face-to-face evaluation for Capacity evaluation as he has improved his mental status since last evaluation.  Patient has been compliant with his medications and without adverse effects. Patient able to cooperate with the evaluation without irritable, distraction, and ruminated on his food and drink. Patient has not required frequent redirection like doing previous visit. Patient has understanding about this condition under needed treatment unwilling to provide consent for stent placement.  MSE: Patient is awake, alert  and orientation to time place person and situation . He has poor grooming and no abnormal  psychomotor activity. He has  fine  mood and affect is appropriate and congruent. He has normal rate rhythm and volume of speech. His thought process is  Linear and goal-directed. Patient has no loosening of associations or flight of ideations. . He has intact cognitions and  fair insight, judgment and impulse control.   Past Medical History  Diagnosis Date  . Rheumatoid arthritis(714.0)   . Obesity   . Major depression     with acute psychotic break in 06/2010  . Diabetes mellitus   . Hypertension   . Hyperlipidemia   . Diverticulosis of colon   . COPD (chronic obstructive pulmonary disease)   . Anxiety   . GERD (gastroesophageal reflux disease)   . Vertigo   . Seizures   . Fibromyalgia   . Dermatomyositis   . Myocardial infarct     mulitple (1999, 2000, 2004)  . Raynaud's disease   . Narcotic dependence   . Peripheral neuropathy   . Internal hemorrhoids   . Ischemic heart disease   . Hiatal hernia   . Gastritis   . Diverticulitis   . Hx of adenomatous colonic polyps   . Nephrolithiasis   . Anemia   . Esophageal stricture   . Esophageal dysmotility   . Dermatomyositis   . Paroxysmal a-fib   . Urge  incontinence   . Otosclerosis   . Bipolar 1 disorder   . OCD (obsessive compulsive disorder)     Past Surgical History  Procedure Laterality Date  . Knee arthroscopy w/ meniscal repair  2009    left  . Lumbar disc surgery    . Squamous papilloma   2010    removed by Dr. Pollyann Kennedy ENT, noted on tongue  . Esophagogastroduodenoscopy N/A 09/27/2012    Procedure: ESOPHAGOGASTRODUODENOSCOPY (EGD);  Surgeon: Hart Carwin, MD;  Location: [facility];  Service: Endoscopy;  Laterality: N/A;  . Colonoscopy N/A 09/27/2012    Procedure: COLONOSCOPY;  Surgeon: Hart Carwin, MD;  Location: [facility];  Service: Endoscopy;  Laterality: N/A;    Family History  Problem Relation Age of Onset  . Alcohol abuse Mother   . Depression Mother   . Heart disease Mother   . Diabetes Mother   . Stroke Mother   . Diabetes Other     1/2 brother  . Hepatitis Brother     Social History:  reports that he has been smoking Cigarettes.  He has been smoking about 0.50 packs per day. He has never used smokeless tobacco. He reports that he does not drink alcohol or use illicit drugs.  Allergies:  Allergies  Allergen Reactions  . Betamethasone Dipropionate     Unknown  . Bupropion Hcl     Unknown  . Ciprofloxacin     REACTION:  swelling  . Clobetasol     Unknown  . Codeine     Unknown  . Escitalopram Oxalate     Unknown  . Flagyl [Metronidazole]     REACTION: swelling  . Fluoxetine Hcl     Unknown  . Fluticasone-Salmeterol     Unknown  . Furosemide     Unknown  . Immune Globulins     Acute renal failure  . Paroxetine     Unknown  . Penicillins     Unknown  . Statins     Unknown  . Sulfa Antibiotics   . Tacrolimus     Unknown  . Tetanus Toxoid     Unknown  . Tuberculin Purified Protein Derivative     Unknown    Medications: I have reviewed the patient's current medications.  Results for orders placed during the hospital encounter of 10/16/12 (from the past 48 hour(s))  BASIC METABOLIC  PANEL     Status: Abnormal   Collection Time    10/18/12  4:00 AM      Result Value Range   Sodium 142  135 - 145 mEq/L   Potassium 3.6  3.5 - 5.1 mEq/L   Chloride 112  96 - 112 mEq/L   CO2 23  19 - 32 mEq/L   Glucose, Bld 104 (*) 70 - 99 mg/dL   BUN 14  6 - 23 mg/dL   Comment: DELTA CHECK NOTED   Creatinine, Ser 0.74  0.50 - 1.35 mg/dL   Calcium 8.6  8.4 - 96.0 mg/dL   GFR calc non Af Amer >90  >90 mL/min   GFR calc Af Amer >90  >90 mL/min   Comment: (NOTE)     The eGFR has been calculated using the CKD EPI equation.     This calculation has not been validated in all clinical situations.     eGFR's persistently <90 mL/min signify possible Chronic Kidney     Disease.  TSH     Status: None   Collection Time    10/18/12  4:00 AM      Result Value Range   TSH 0.752  0.350 - 4.500 uIU/mL   Comment: Performed at [facility]  CBC WITH DIFFERENTIAL     Status: Abnormal   Collection Time    10/18/12  2:04 PM      Result Value Range   WBC 6.5  4.0 - 10.5 K/uL   RBC 4.28  4.22 - 5.81 MIL/uL   Hemoglobin 13.4  13.0 - 17.0 g/dL   HCT 45.4  09.8 - 11.9 %   MCV 94.6  78.0 - 100.0 fL   MCH 31.3  26.0 - 34.0 pg   MCHC 33.1  30.0 - 36.0 g/dL   RDW 14.7 (*) 82.9 - 56.2 %   Platelets 288  150 - 400 K/uL   Neutrophils Relative % 60  43 - 77 %   Neutro Abs 3.9  1.7 - 7.7 K/uL   Lymphocytes Relative 27  12 - 46 %   Lymphs Abs 1.7  0.7 - 4.0 K/uL   Monocytes Relative 12  3 - 12 %   Monocytes Absolute 0.8  0.1 - 1.0 K/uL   Eosinophils Relative 1  0 - 5 %   Eosinophils Absolute 0.1  0.0 - 0.7 K/uL   Basophils Relative 0  0 - 1 %   Basophils Absolute 0.0  0.0 - 0.1 K/uL  CBC     Status: Abnormal  Collection Time    10/19/12  4:49 AM      Result Value Range   WBC 7.5  4.0 - 10.5 K/uL   RBC 4.24  4.22 - 5.81 MIL/uL   Hemoglobin 13.0  13.0 - 17.0 g/dL   HCT 14.7  82.9 - 56.2 %   MCV 96.0  78.0 - 100.0 fL   MCH 30.7  26.0 - 34.0 pg   MCHC 31.9  30.0 - 36.0 g/dL   RDW 13.0 (*)  86.5 - 15.5 %   Platelets 303  150 - 400 K/uL  BASIC METABOLIC PANEL     Status: Abnormal   Collection Time    10/19/12  4:49 AM      Result Value Range   Sodium 146 (*) 135 - 145 mEq/L   Potassium 3.3 (*) 3.5 - 5.1 mEq/L   Chloride 111  96 - 112 mEq/L   CO2 24  19 - 32 mEq/L   Glucose, Bld 93  70 - 99 mg/dL   BUN 10  6 - 23 mg/dL   Creatinine, Ser 7.84  0.50 - 1.35 mg/dL   Calcium 8.6  8.4 - 69.6 mg/dL   GFR calc non Af Amer >90  >90 mL/min   GFR calc Af Amer >90  >90 mL/min   Comment: (NOTE)     The eGFR has been calculated using the CKD EPI equation.     This calculation has not been validated in all clinical situations.     eGFR's persistently <90 mL/min signify possible Chronic Kidney     Disease.    Dg Chest 2 View  10/18/2012   *RADIOLOGY REPORT*  Clinical Data: Elevated white blood count.  CHEST - 2 VIEW  Comparison: Chest x-ray dated 10/16/2012  Findings: Heart size and pulmonary vascularity are normal.  The lungs are hyperinflated with flattening of the diaphragm.  Slight blunting of the posterior costophrenic angles could be due to tiny effusions.  Minimal linear atelectasis at the right lung base.  No consolidative infiltrates.  No acute osseous abnormalities.  IMPRESSION: 1.  Minimal atelectasis at the right lung base. 2.  Possible tiny effusions. 3.  Hyperinflated lungs suggesting emphysema.   Original Report Authenticated By: Francene Boyers, M.D.    Positive for aggressive behavior, anxiety, bad mood, behavior problems, learning difficulty, mood swings, sleep disturbance and medication non compliace Blood pressure 147/71, pulse 60, temperature 98.6 F (37 C), temperature source Oral, resp. rate 18, height 5\' 4"  (1.626 m), weight 85.7 kg (188 lb 15 oz), SpO2 96.00%.   Assessment/Plan: Depressive disorder NOS Anxiety disorder NOS R/O Bipolar disorder  Recommendation: Patient does meet criteria for capacity to make his own medical decision. Patient benefit from SNF  with psychiatric services available Continue Depakote 500 mg BID for manic symptoms  Continue Risperidone M-Tabs 0.5 mg BID for agitation or aggressive behaviors.  Appreciate psych consult and will follow up as clinically required  Emauri Krygier,JANARDHAHA R. 10/19/2012, 5:51 PM

## 2012-10-20 MED ORDER — RISPERIDONE 0.5 MG PO TBDP: 0.5000 mg | ORAL_TABLET | Freq: Two times a day (BID) | ORAL | Status: DC | PRN

## 2012-10-20 MED ORDER — METOPROLOL SUCCINATE 12.5 MG HALF TABLET: 12.5000 mg | ORAL_TABLET | Freq: Every day | ORAL | Status: DC

## 2012-10-20 MED ORDER — LEVETIRACETAM 500 MG PO TABS: 500.0000 mg | ORAL_TABLET | Freq: Two times a day (BID) | ORAL | Status: DC

## 2012-10-20 MED ORDER — DIVALPROEX SODIUM ER 500 MG PO TB24: 500.0000 mg | ORAL_TABLET | Freq: Two times a day (BID) | ORAL | Status: DC

## 2012-10-20 MED ORDER — ASPIRIN 81 MG PO TBEC: 81.0000 mg | DELAYED_RELEASE_TABLET | Freq: Every day | ORAL | Status: DC
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
## 2012-10-20 NOTE — Progress Notes (Signed)
Family Medicine Teaching Service Daily Progress Note Intern Pager: [phone]  Patient name: Patrick Holt Medical record number: [id number] Date of birth: [birth year] Age: 62 y.o. Gender: male  Primary Care Provider: Gaspar Bidding, DO Consultants: Psych, Cardiology Code Status: Full  Pt Overview and Major Events to Date:   9/1: AMS; CK (4390); Cr (1.53); BUN (41); K (3); EKG: Prolonged QTc w/ ST depression; UDS neg 9/2: Psych consult: Patient does not meet criteria for capacity  9/3: Cards CS: PAF, started Toprol; ECHO: Mild LVH EF 60-65% 9/4: A&Ox4; WBC wnl: Abx stopped; Psych has capacity and SW to assist w/ d/c 9/5: Medically ready for discharge; awaiting placement: SW coordinating    Assessment and Plan: Patrick Holt is a 62 y.o. year old male presenting with altered mental state after being found down on floor. PMH is extensive and significant for Major depression with anxiety, psychosis, bipolar disorder, seizure disorder, HTN, A.fib, COPD, dermatomyositis, MI (multiple) and narcotic dependence with non-compliance to medical management.   # Altered mental status: Resolved   Patient has extensive psych history with Major depressive disorder, anxiety, bipolar d/o and psychosis. Per PCP and [facility] notes, it appears patient removed himself of all medications over the last year. He has been prescribed Seroquel, Depakote and Cymbalta in the past. Recent refill on Xanax (#90), although negative for benzo on UDS today. Uncertain etiology of AMS: Infectious unlikely WBC returned to normal and Abx stopped. CNS unlikely CT head normal, and continuing Keppra for seizure Hx. UDS negative and Ethanol, Salicylate, Acetaminophen all wnl    Appreciate psych consult and recommendations: Pt does has capacity, recommend SNF with psych services, psych SW  Continue depakote with risperidone prn  Cardiac: Hx of Afib and MI x 3   EKG: Prolonged QTc 547, St depression in anterior and lateral leads  noted  Repeat EKG 9/3: NSR, Qtc 434, St depression in anterior and lateral leads noted  Troponin: neg x 3  SW consulted: Has Private room at [facility] clearance [facility]) and PAS-R # (should receive today)  # Hypokalemia  Recent colonoscopy on the 14th. Patient was asked to hold demedex and take potassium supplement.  Will continue to monitor and replete as needed: 40mg  PO Kdur 9/4    Continued to hold demadex for now. Watch for fluid overload. Weight 211.9 on admission, and is extremely dry.   # Elevated CK: Likely from fall  CK 4390 Initial  Trending down: CK (9/2): 1871  # A.fib  Currently NSR  Last Echo 12/13, mild LVH. Systolic function was vigorous. EF 65% to 70%. Wall motion was normal; there were no regional wall motion abnormalities. Grade 1 diastolic dysfunction. Mild AS. MV calcified. Left atrium mildly dilated.  Continuing Amiodarone 200 mg QD  ASA Daily  Appreciate Cardiology consult recommendations  Recommend continuing amiodarone, metoprolol, asa  Dec Metoprolol to 12.5 due to continued bradycardia overnight   # Seizure disorder:   Patient previously on Keppra. Has been non-compliant with medication. Uncertain if altered mental status/syncope is from seizure. CK is elevated, but likely from early rhabdomyolysis from being down for 3 days.   Keppra 500 mg BID ordered while inpatient   Uncertain if patient will be compliant on Keppra given his history.   FEN/GI:   Speech Eval Performed  Diet: Dysphagia 2 (Fine chop);Thin liquids  Continue home PPI  Prophylaxis: Hep SQ  Consults  PT, OT, SW  Will consult with SW for placement  Disposition: Pending SNF  placement with SW guidance  Subjective:  Pt alert and oriented this morning and amendable to SNF placement as long as he gets a private room (due to his "immune status" for being on MTX.    Objective: Temp:  [98.4 F (36.9 C)-98.9 F (37.2 C)] 98.9 F (37.2 C)  (09/05 0605) Pulse Rate:  [63-69] 65 (09/05 0832) Resp:  [18] 18 (09/05 0605) BP: (147-163)/(85-86) 147/85 mmHg (09/05 0832) SpO2:  [98 %] 98 % (09/05 3244)  Physical Exam: General/Psych: A+Ox4 Cardiovascular: RRR.  No murmurs Respiratory: CTAB, normal effort Abdomen: Soft, NTND. BS present. No masses palpated.  Extremities: No erythema or edema.  Skin: Multiple contusions and scrapes (old) over arms, shins and nose.  Neuro: CN grossly normal  Laboratory:  Recent Labs Lab 10/17/12 0347 10/18/12 1404 10/19/12 0449  WBC 13.6* 6.5 7.5  HGB 15.5 13.4 13.0  HCT 44.5 40.5 40.7  PLT 341 288 303    Recent Labs Lab 10/16/12 1627  10/17/12 0347 10/18/12 0400 10/19/12 0449  NA 142  < > 145 142 146*  K 3.0*  < > 3.1* 3.6 3.3*  CL 100  < > 108 112 111  CO2 23  --  27 23 24   BUN 41*  < > 30* 14 10  CREATININE 1.53*  < > 1.09 0.74 0.75  CALCIUM 9.6  --  8.9 8.6 8.6  PROT 8.0  --  6.6  --   --   BILITOT 1.7*  --  1.6*  --   --   ALKPHOS 93  --  77  --   --   ALT 55*  --  50  --   --   AST 114*  --  92*  --   --   GLUCOSE 101*  < > 93 104* 93  < > = values in this interval not displayed. Imaging/Diagnostic Tests: CXR: Heart is normal size. Minimal bibasilar atelectasis. No effusions or acute bony abnormality.  CT Head wo contrast: Unchanged appearance from prior study. Chronic involutional change with no acute traumatic injury.  CT CERVICAL SPINE: Incidental note is made of debris within the pharynx, likely retained secretions. There is no evidence of cervical spine acute fracture. There is a chronic corticated fracture of the T1 spinous process. IMPRESSION: No acute fracture. Filling defects/debris in the hypopharynx likely related to mucous and retained secretions. Consider reevaluation to exclude polypoid lesions when the patient's physical condition allows. .  EKG: Prolonged QTc, St depression in anterior and lateral leads noted.  ECHO: LVH, EF 60-65%, AS mild  Wenda Low,  MD 10/20/2012, 11:04 AM PGY-1, [facility] Family Medicine FPTS Intern pager: [phone], text pages welcome

## 2012-10-20 NOTE — Progress Notes (Signed)
Pt says wallet was in his closet at bedside.  After thorough search of all personal belongings and in pt room, unable to find pt wallet.  Security, [address], and [address] were all called and asked about wallet.  Nobody had any info on this.  Security then came up to pt room and filled out a missing item report with the pt.  Pt was notified that if any other info was needed, including signature, security would get in touch with him at SNF.  Pt agrees to plan.

## 2012-10-20 NOTE — Clinical Documentation Improvement (Signed)
THIS DOCUMENT IS NOT A PERMANENT PART OF THE MEDICAL RECORD  Please update your documentation with the medical record to reflect your response to this query. If you need help knowing how to do this please call [phone].  10/20/12  Dear Dr. Gayla Doss,   In an effort to better capture your patient's severity of illness, reflect appropriate length of stay and utilization of resources, a review of the patient medical record has revealed the following indicators.    Current documentation lists this patient's Principal Diagnosis as"AMS, altered mental status" which is a low weighted symptom. The PN on 9/2 states:"AMS/encephalopathy" which is more specific. If possible, please clarify in Notes and DC summary the nonspecific term "AMS" to better illustrate this patient's severity of illness and risk of mortality. Thank you.  Possible Clinical Conditions?  - Encephalopathy (describe type if known)                       Anoxic                       Withdrawal                        Alcoholic                        Hepatic                       Hypertensive                       Metabolic                       Toxic - Traumatic brain injury - Drug induced confusion/delirium - Acute exacerbation of known dementia (indicate type) - New diagnosis of Dementia, Alzheimer's, cerebral atherosclerosis - Other Condition (please specify)    Reviewed: additional documentation in the medical record  Thank You,  Beverley Fiedler RN BSN Clinical Documentation Specialist: [phone] Health Information Management [place]

## 2012-10-20 NOTE — Progress Notes (Signed)
CSW faxed updated PT note to [facility]. Still awaiting insurance authorization. Hopeful it will be received today. Also still awaiting PASARR approval. Pt does have a private room available at [facility].  Edsel Shives, LCSWA [phone]

## 2012-10-20 NOTE — Clinical Social Work Psych Note (Signed)
Psych CSW reviewed chart.  Psychiatry re-evaluated pt on 10/19/12 and documented that "Patient does meet criteria for capacity to make his own medical decision."  Unit CSW is assisting with placement at SNF.  No other psych needs identified.  Capacity has been documented on.  Psych CSW signing off.  Vickii Penna, LCSWA [phone]  Clinical Social Work

## 2012-10-20 NOTE — Care Management Note (Signed)
    Page 1 of 1   10/20/2012     11:33:21 AM   CARE MANAGEMENT NOTE 10/20/2012  Patient:  ZACKRY, DEINES   Account Number:  [id number]  Date Initiated:  10/17/2012  Documentation initiated by:  [facility]  Subjective/Objective Assessment:   Found down.     Action/Plan:   Anticipated DC Date:  10/20/2012   Anticipated DC Plan:  HOME/SELF CARE  In-house referral  Clinical Social Worker      DC Planning Services  CM consult      Choice offered to / List presented to:             Status of service:  Completed, signed off Medicare Important Message given?   (If response is "NO", the following Medicare IM given date fields will be blank) Date Medicare IM given:   Date Additional Medicare IM given:    Discharge Disposition:  SKILLED NURSING FACILITY  Per UR Regulation:  Reviewed for med. necessity/level of care/duration of stay  If discussed at Long Length of Stay Meetings, dates discussed:    Comments:  Contact:  Raeanne Gathers Friend [phone]   [phone]  10-20-12 Plan for d/c to SNF- Per psych pt has capacity for decision making. CSW [place] is assisting with disposition needs. Awaiting insurance authorization. No needs from CM at this time. Tomi Bamberger, RN,BSN [phone]

## 2012-10-20 NOTE — Progress Notes (Signed)
FMTS Attending Note Patient seen and examined by me today, discussed with resident team and I agree with Dr. Whitman Hero note for today.  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 Plan for discharge from hospital today to either ALF or SNF; patient is deemed to have capacity to make health care decisions.  Awaiting further instruction/information from CSW regarding discharge, which we anticipate to be this afternoon. Paula Compton, MD

## 2012-10-20 NOTE — Progress Notes (Signed)
CSW (Clinical Child psychotherapist) prepared pt dc packet and placed with pt shadow chart. CSW notified facility and nurse. Arranging for transport for 6pm.  Sharol Harness, LCSWA [phone]

## 2012-10-20 NOTE — Progress Notes (Signed)
CSW (Clinical Child psychotherapist) informed resident that pt has Therapist, occupational and can dc today. Awaiting dc summary to facilitate transport to [facility].  Lianna Sitzmann, LCSWA [phone]

## 2012-10-20 NOTE — Discharge Summary (Addendum)
Family Medicine Teaching [facility] Discharge Summary  Patient name: Patrick Holt Medical record number: [id number] Date of birth: 1950-10-27 Age: 62 y.o. Gender: male Date of Admission: 10/16/2012  Date of Discharge: 10/20/2012 Admitting Physician: Tobey Grim, MD  Primary Care Provider: Gaspar Bidding, DO Consultants: Psych, Cardiology   Indication for Hospitalization: Altered mental status after being found down ~ 3 days  Discharge Diagnoses/Problem List:   # Encephalopathy (Unknown Cause) - Patient has extensive psych history with major depressive disorder, anxiety, bipolar d/o and psychosis. Per PCP and [facility] notes, it appears patient removed himself of all medications over the last year. He had been prescribed Seroquel, Depakote and Cymbalta in the past. Recent refill on Xanax (#90), although UDS was negative. Several etiologies for AMS were evaluated and ruled out. Infectious: his initial WBC was 17K (Neuts 84%), UA and CXR were unremarkable, HIV & RPR were non-reactive, Azetreonam and Vanc were discontinued after his blood cultures were negative for 48hrs and WBC returned to normal. CNS: CT of head revealed no acute processes or hydrocephalus, and his Keppra was continued through his hospitalization. Cardiac: Cardiology was consulted, telemetry showed episodes of Afib while on Amiodarone, and Toprol was added at 25mg  qd then decreased to 12.5mg  qd due to bradycardia. Metabolic: mild hypokalemia on presentation that was corrected, but otherwise Na, Ca, Gluc, and Bicarb were normal. Psychology was consulted, and restarted depakote & risperidone. Patrick Holt mentation had return to his baseline at discharge.   # Hypokalemia: Patrick Holt had recent colonoscopy and had been asked to hold demedex and take potassium supplement. He had some hypokalemia and this was repleted as needed.  # Elevated CK: Likely from fall. His CK was 4390 on 9/1, and had trended down to 1871 on 10/17/12.   #  A.fib: Patrick Holt was evaluated by Cardiology and found to still have paroxysmal atrial fibrillation. His amiodarone was continued during his hospitalization, and Toprol was added. He had an ECHO to further evaluate which showed mild LVH with preserved EF 60-65% and mild aortic stenosis. Telemetry showed normal sinus rhythm at discharge.  # Seizure disorder: Patient previously on Keppra, and had been non-compliant with medication. Uncertain if altered mental status/syncope was from seizure. CK was elevated, but likely from early rhabdomyolysis from being down for 3 days. Keppra 500 mg BID ordered while inpatient, and no seizure activity was noted.   Disposition: SNF  Discharge Condition: Stable   Brief Hospital Course:Patrick Holt is a 62 y.o. year old male presented with altered mental state after being found down at his home for approximately 3 days. Due to his extensive past medical history significant for Major depression with anxiety, psychosis, bipolar disorder, seizure disorder, HTN, A.fib, COPD, dermatomyositis, MI (multiple) and narcotic dependence with non-compliance to medical management an extensive work-up was performed. He was extremely dehydrated initially and had acute kidney injury with Cr. 1.53 and BUN 41, elevated CK of 4390, and prolonged QTc of 547. Psychology and Cardiology were consulted and provided recommendation throughout his hospitalization. Patrick Holt was initially deemed not to have medical capacity and SNF placement was recommended. His mental status improved with rehydrated and re initiation of psych medications (depakote and risperidone), and Patrick Holt was deemed to HAVE medical capacity. PT worked with Patrick Holt and found that he was very impulsive and unsafe with mobility as well as being unable to keep self adequately clean due to multiple episodes of urinary and bowel incontinence. PT recommended SNF at d/c for  24 hr supervision. OT found significant cognitive  deficits which impeded his ability to perform BADLs and recommended SNF placement. Social work was consulted to assist with discharge placement.  He will be going to [facility].  At discharge: QTc, Cr, WBC had returned to normal.   Issues for Follow Up:  1. CK was trending down 2. Continue Keppra for seizure prophylaxis 3. Continue Amiodarone and Toprol as tolerated for Afib 4. Continue  Depakote & Risperidone   Significant Procedures: None  Significant Labs and Imaging:   Results for orders placed during the hospital encounter of 10/16/12 (from the past 72 hour(s))  BASIC METABOLIC PANEL     Status: Abnormal   Collection Time    10/18/12  4:00 AM      Result Value Range   Sodium 142  135 - 145 mEq/L   Potassium 3.6  3.5 - 5.1 mEq/L   Chloride 112  96 - 112 mEq/L   CO2 23  19 - 32 mEq/L   Glucose, Bld 104 (*) 70 - 99 mg/dL   BUN 14  6 - 23 mg/dL   Comment: DELTA CHECK NOTED   Creatinine, Ser 0.74  0.50 - 1.35 mg/dL   Calcium 8.6  8.4 - 86.5 mg/dL   GFR calc non Af Amer >90  >90 mL/min   GFR calc Af Amer >90  >90 mL/min   Comment: (NOTE)     The eGFR has been calculated using the CKD EPI equation.     This calculation has not been validated in all clinical situations.     eGFR's persistently <90 mL/min signify possible Chronic Kidney     Disease.  TSH     Status: None   Collection Time    10/18/12  4:00 AM      Result Value Range   TSH 0.752  0.350 - 4.500 uIU/mL   Comment: Performed at [facility]  CBC WITH DIFFERENTIAL     Status: Abnormal   Collection Time    10/18/12  2:04 PM      Result Value Range   WBC 6.5  4.0 - 10.5 K/uL   RBC 4.28  4.22 - 5.81 MIL/uL   Hemoglobin 13.4  13.0 - 17.0 g/dL   HCT 78.4  69.6 - 29.5 %   MCV 94.6  78.0 - 100.0 fL   MCH 31.3  26.0 - 34.0 pg   MCHC 33.1  30.0 - 36.0 g/dL   RDW 28.4 (*) 13.2 - 44.0 %   Platelets 288  150 - 400 K/uL   Neutrophils Relative % 60  43 - 77 %   Neutro Abs 3.9  1.7 - 7.7 K/uL   Lymphocytes  Relative 27  12 - 46 %   Lymphs Abs 1.7  0.7 - 4.0 K/uL   Monocytes Relative 12  3 - 12 %   Monocytes Absolute 0.8  0.1 - 1.0 K/uL   Eosinophils Relative 1  0 - 5 %   Eosinophils Absolute 0.1  0.0 - 0.7 K/uL   Basophils Relative 0  0 - 1 %   Basophils Absolute 0.0  0.0 - 0.1 K/uL  CBC     Status: Abnormal   Collection Time    10/19/12  4:49 AM      Result Value Range   WBC 7.5  4.0 - 10.5 K/uL   RBC 4.24  4.22 - 5.81 MIL/uL   Hemoglobin 13.0  13.0 - 17.0 g/dL   HCT 40.7  39.0 - 52.0 %   MCV 96.0  78.0 - 100.0 fL   MCH 30.7  26.0 - 34.0 pg   MCHC 31.9  30.0 - 36.0 g/dL   RDW 28.4 (*) 13.2 - 44.0 %   Platelets 303  150 - 400 K/uL  BASIC METABOLIC PANEL     Status: Abnormal   Collection Time    10/19/12  4:49 AM      Result Value Range   Sodium 146 (*) 135 - 145 mEq/L   Potassium 3.3 (*) 3.5 - 5.1 mEq/L   Chloride 111  96 - 112 mEq/L   CO2 24  19 - 32 mEq/L   Glucose, Bld 93  70 - 99 mg/dL   BUN 10  6 - 23 mg/dL   Creatinine, Ser 1.02  0.50 - 1.35 mg/dL   Calcium 8.6  8.4 - 72.5 mg/dL   GFR calc non Af Amer >90  >90 mL/min   GFR calc Af Amer >90  >90 mL/min   Comment: (NOTE)     The eGFR has been calculated using the CKD EPI equation.     This calculation has not been validated in all clinical situations.     eGFR's persistently <90 mL/min signify possible Chronic Kidney     Disease.   CT Head IMPRESSION:  Unchanged appearance from prior study. Chronic involutional  change with no acute traumatic injury.  CT CSpine IMPRESSION:  Unchanged appearance from prior study. Chronic involutional  change with no acute traumatic injury.  CXR: IMPRESSION:  1. Minimal atelectasis at the right lung base.  2. Possible tiny effusions.  3. Hyperinflated lungs suggesting emphysema.  ECHO:  Study Conclusions  - Left ventricle: The cavity size was normal. Wall thickness was increased in a pattern of mild LVH. Systolic function was normal. The estimated ejection fraction was in  the range of 60% to 65%. Wall motion was normal; there were no regional wall motion abnormalities. - Aortic valve: There was mild stenosis. Valve area: 1.44cm^2(VTI). Valve area: 1.29cm^2 (Vmax). - Left atrium: The atrium was moderately dilated. - Tricuspid valve: Moderate regurgitation. - Pulmonary arteries: PA peak pressure: 34mm Hg (S).  Results/Tests Pending at Time of Discharge: none  Discharge Medications:    Medication List         ALPRAZolam 1 MG tablet  Commonly known as:  XANAX  Take 1 tablet (1 mg total) by mouth 3 (three) times daily as needed for sleep or anxiety.     amiodarone 200 MG tablet  Commonly known as:  PACERONE  Take 1 tablet (200 mg total) by mouth daily before breakfast.     aspirin 81 MG EC tablet  Take 1 tablet (81 mg total) by mouth daily.     divalproex 500 MG 24 hr tablet  Commonly known as:  DEPAKOTE ER  Take 1 tablet (500 mg total) by mouth 2 (two) times daily with a meal.     esomeprazole 40 MG capsule  Commonly known as:  NEXIUM  Take 40 mg by mouth daily before breakfast.     Ipratropium-Albuterol 20-100 MCG/ACT Aers respimat  Commonly known as:  COMBIVENT  Inhale 1 puff into the lungs every 6 (six) hours.     levETIRAcetam 500 MG tablet  Commonly known as:  KEPPRA  Take 1 tablet (500 mg total) by mouth 2 (two) times daily.     methotrexate 2.5 MG tablet  Commonly known as:  RHEUMATREX  Take 2.5 mg by mouth 3 (  three) times a week. Caution:Chemotherapy. Protect from light.     metoprolol succinate 12.5 mg Tb24 24 hr tablet  Commonly known as:  TOPROL-XL  Take 0.5 tablets (12.5 mg total) by mouth daily.     nitroGLYCERIN 0.4 MG SL tablet  Commonly known as:  NITROSTAT  Place 0.4 mg under the tongue every 5 (five) minutes as needed. For chest pain     oxybutynin 5 MG tablet  Commonly known as:  DITROPAN  Take 1 tablet (5 mg total) by mouth 3 (three) times daily.     risperiDONE 0.5 MG disintegrating tablet  Commonly known  as:  RISPERDAL M-TABS  Take 1 tablet (0.5 mg total) by mouth 2 (two) times daily as needed (agitation).     rOPINIRole 2 MG tablet  Commonly known as:  REQUIP  Take 1 tablet (2 mg total) by mouth 3 (three) times daily.     torsemide 20 MG tablet  Commonly known as:  DEMADEX  Take 1 tablet (20 mg total) by mouth daily.        Discharge Instructions: Please refer to Patient Instructions section of EMR for full details.  Patient was counseled important signs and symptoms that should prompt return to medical care, changes in medications, dietary instructions, activity restrictions, and follow up appointments.   Follow-Up Appointments:  Charm Rings, MD 10/20/2012, 4:02 PM PGY-1, [facility] Family Medicine

## 2012-10-23 LAB — CULTURE, BLOOD (ROUTINE X 2)

## 2012-10-23 NOTE — Discharge Summary (Signed)
I agree with the Discharge Summary as documented by the resident.

## 2012-10-24 NOTE — Discharge Summary (Signed)
I agree with the Discharge Summary as documented.   Donnella Sham MD

## 2012-10-26 ENCOUNTER — Ambulatory Visit (HOSPITAL_COMMUNITY): Payer: Self-pay | Admitting: Psychiatry

## 2012-10-27 ENCOUNTER — Non-Acute Institutional Stay (SKILLED_NURSING_FACILITY): Payer: Medicare HMO | Admitting: Internal Medicine

## 2012-10-27 DIAGNOSIS — J449 Chronic obstructive pulmonary disease, unspecified: Secondary | ICD-10-CM

## 2012-10-27 DIAGNOSIS — R569 Unspecified convulsions: Secondary | ICD-10-CM

## 2012-10-27 DIAGNOSIS — K219 Gastro-esophageal reflux disease without esophagitis: Secondary | ICD-10-CM

## 2012-10-27 DIAGNOSIS — I4891 Unspecified atrial fibrillation: Secondary | ICD-10-CM

## 2012-10-30 ENCOUNTER — Non-Acute Institutional Stay (SKILLED_NURSING_FACILITY): Payer: Medicare HMO | Admitting: Internal Medicine

## 2012-10-30 ENCOUNTER — Encounter (HOSPITAL_COMMUNITY): Payer: Self-pay

## 2012-10-30 DIAGNOSIS — I4891 Unspecified atrial fibrillation: Secondary | ICD-10-CM

## 2012-10-30 DIAGNOSIS — E1059 Type 1 diabetes mellitus with other circulatory complications: Secondary | ICD-10-CM

## 2012-10-30 DIAGNOSIS — R569 Unspecified convulsions: Secondary | ICD-10-CM

## 2012-10-30 DIAGNOSIS — I480 Paroxysmal atrial fibrillation: Secondary | ICD-10-CM

## 2012-10-30 NOTE — Progress Notes (Signed)
PROGRESS NOTE  DATE: 10/30/2012   FACILITY: [facility]  LEVEL OF CARE: SNF (31)  Discharge Visit  CHIEF COMPLAINT:  Manage seizure disorder and atrial fibrillation  HISTORY OF PRESENT ILLNESS: I was requested by the social worker to perform face-to-face evaluation for discharge:  Patient was admitted to this facility for short-term rehabilitation after the patient's recent hospitalization for altered mental status.  Patient has completed SNF rehabilitation and therapy has cleared the patient for discharge on 11-02-12.  Reassessment of ongoing problem(s):  ATRIAL FIBRILLATION: the patients atrial fibrillation remains stable.  The patient denies DOE, tachycardia, orthopnea, transient neurological sx, pedal edema, palpitations, & PNDs.  No complications noted from the medications currently being used.  SEIZURE DISORDER: The patient's seizure disorder remains stable. No complications reported from the medications presently being used. Staff do not report any recent seizure activity.  PAST MEDICAL HISTORY : Reviewed.  No changes.  CURRENT MEDICATIONS: Reviewed per [facility]  REVIEW OF SYSTEMS:  GENERAL: no change in appetite, no fatigue, no weight changes, no fever, chills or weakness RESPIRATORY: no cough, SOB, DOE, wheezing, hemoptysis CARDIAC: no chest pain, edema or palpitations GI: no abdominal pain, diarrhea, constipation, heart burn, nausea or vomiting  PHYSICAL EXAMINATION  GENERAL: no acute distress, normal body habitus EYES: conjunctivae normal, sclerae normal, normal eye lids NECK: supple, trachea midline, no neck masses, no thyroid tenderness, no thyromegaly LYMPHATICS: no LAN in the neck, no supraclavicular LAN RESPIRATORY: breathing is even & unlabored, BS CTAB CARDIAC: RRR, no murmur,no extra heart sounds, no edema GI: abdomen soft, normal BS, no masses, no tenderness, no hepatomegaly, no splenomegaly PSYCHIATRIC: the patient is alert & oriented to  person, affect & behavior appropriate  LABS/RADIOLOGY: None  ASSESSMENT/PLAN:  Seizure disorder-stable Atrial fibrillation-rate controlled CAD-stable 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  Patient will receive home health PT, OT, SW, and CNA. Social worker is for [employer] DME provided: Wheelchair with leg rest (V15.88, 728.87). Patient requires a wheelchair for mobility and increased ability to perform ADLs independently due to diagnoses of history of falls and muscle weakness. Resident is able to self propel.  Total discharge time: Greater than 30 minutes Discharge time involved coordination of the discharge process with social worker, nursing staff and therapy department. Medical justification for home health services/DME verified.  CPT CODE: 69629

## 2012-11-06 DIAGNOSIS — E1149 Type 2 diabetes mellitus with other diabetic neurological complication: Secondary | ICD-10-CM

## 2012-11-06 DIAGNOSIS — G909 Disorder of the autonomic nervous system, unspecified: Secondary | ICD-10-CM

## 2012-11-06 DIAGNOSIS — R569 Unspecified convulsions: Secondary | ICD-10-CM

## 2012-11-06 DIAGNOSIS — I10 Essential (primary) hypertension: Secondary | ICD-10-CM

## 2012-11-07 ENCOUNTER — Telehealth: Payer: Self-pay | Admitting: *Deleted

## 2012-11-07 NOTE — Telephone Encounter (Signed)
Verbal order given. .me

## 2012-11-07 NOTE — Telephone Encounter (Signed)
Agree with referral.  Patient needs to followup with me as well.

## 2012-11-07 NOTE — Telephone Encounter (Signed)
PT form [facility] calling to set up  Follow up appt for patient as he was just recently d/c'ed from facility; also wants verbal orders from MD for a nurse to go out and help him with his medications. Will forward to PCP and team.

## 2012-11-07 NOTE — Telephone Encounter (Signed)
JWJXBJ from [facility] calls again wanting verbal orders from MD/ nurse for a Skilled Nurse to help patient with medications. Please call Shauda at [phone].

## 2012-11-08 ENCOUNTER — Emergency Department (HOSPITAL_COMMUNITY): Payer: Medicare HMO

## 2012-11-08 ENCOUNTER — Encounter (HOSPITAL_COMMUNITY): Payer: Self-pay

## 2012-11-08 ENCOUNTER — Inpatient Hospital Stay (HOSPITAL_COMMUNITY)
Admission: EM | Admit: 2012-11-08 | Discharge: 2012-11-16 | DRG: 641 | Disposition: A | Discharge status: Home Health | Payer: Medicare HMO | Attending: Family Medicine | Admitting: Family Medicine

## 2012-11-08 DIAGNOSIS — R569 Unspecified convulsions: Secondary | ICD-10-CM

## 2012-11-08 DIAGNOSIS — Z91199 Patient's noncompliance with other medical treatment and regimen due to unspecified reason: Secondary | ICD-10-CM

## 2012-11-08 DIAGNOSIS — I7300 Raynaud's syndrome without gangrene: Secondary | ICD-10-CM | POA: Diagnosis present

## 2012-11-08 DIAGNOSIS — F1920 Other psychoactive substance dependence, uncomplicated: Secondary | ICD-10-CM | POA: Diagnosis present

## 2012-11-08 DIAGNOSIS — I10 Essential (primary) hypertension: Secondary | ICD-10-CM | POA: Diagnosis present

## 2012-11-08 DIAGNOSIS — IMO0001 Reserved for inherently not codable concepts without codable children: Secondary | ICD-10-CM | POA: Diagnosis present

## 2012-11-08 DIAGNOSIS — F429 Obsessive-compulsive disorder, unspecified: Secondary | ICD-10-CM | POA: Diagnosis present

## 2012-11-08 DIAGNOSIS — E669 Obesity, unspecified: Secondary | ICD-10-CM | POA: Diagnosis present

## 2012-11-08 DIAGNOSIS — L259 Unspecified contact dermatitis, unspecified cause: Secondary | ICD-10-CM

## 2012-11-08 DIAGNOSIS — E119 Type 2 diabetes mellitus without complications: Secondary | ICD-10-CM

## 2012-11-08 DIAGNOSIS — M3390 Dermatopolymyositis, unspecified, organ involvement unspecified: Secondary | ICD-10-CM | POA: Diagnosis present

## 2012-11-08 DIAGNOSIS — F329 Major depressive disorder, single episode, unspecified: Secondary | ICD-10-CM | POA: Diagnosis present

## 2012-11-08 DIAGNOSIS — F411 Generalized anxiety disorder: Secondary | ICD-10-CM | POA: Diagnosis present

## 2012-11-08 DIAGNOSIS — F17200 Nicotine dependence, unspecified, uncomplicated: Secondary | ICD-10-CM

## 2012-11-08 DIAGNOSIS — Z9119 Patient's noncompliance with other medical treatment and regimen: Secondary | ICD-10-CM

## 2012-11-08 DIAGNOSIS — N3941 Urge incontinence: Secondary | ICD-10-CM | POA: Diagnosis present

## 2012-11-08 DIAGNOSIS — M3313 Other dermatomyositis without myopathy: Secondary | ICD-10-CM | POA: Diagnosis present

## 2012-11-08 DIAGNOSIS — G40909 Epilepsy, unspecified, not intractable, without status epilepticus: Secondary | ICD-10-CM | POA: Diagnosis present

## 2012-11-08 DIAGNOSIS — K219 Gastro-esophageal reflux disease without esophagitis: Secondary | ICD-10-CM | POA: Diagnosis present

## 2012-11-08 DIAGNOSIS — Z8669 Personal history of other diseases of the nervous system and sense organs: Secondary | ICD-10-CM

## 2012-11-08 DIAGNOSIS — R4182 Altered mental status, unspecified: Secondary | ICD-10-CM

## 2012-11-08 DIAGNOSIS — I798 Other disorders of arteries, arterioles and capillaries in diseases classified elsewhere: Secondary | ICD-10-CM | POA: Diagnosis present

## 2012-11-08 DIAGNOSIS — I2510 Atherosclerotic heart disease of native coronary artery without angina pectoris: Secondary | ICD-10-CM | POA: Diagnosis present

## 2012-11-08 DIAGNOSIS — G609 Hereditary and idiopathic neuropathy, unspecified: Secondary | ICD-10-CM | POA: Diagnosis present

## 2012-11-08 DIAGNOSIS — F29 Unspecified psychosis not due to a substance or known physiological condition: Secondary | ICD-10-CM | POA: Diagnosis present

## 2012-11-08 DIAGNOSIS — F22 Delusional disorders: Secondary | ICD-10-CM | POA: Diagnosis present

## 2012-11-08 DIAGNOSIS — E860 Dehydration: Secondary | ICD-10-CM

## 2012-11-08 DIAGNOSIS — Z7982 Long term (current) use of aspirin: Secondary | ICD-10-CM

## 2012-11-08 DIAGNOSIS — E1159 Type 2 diabetes mellitus with other circulatory complications: Secondary | ICD-10-CM | POA: Diagnosis present

## 2012-11-08 DIAGNOSIS — I252 Old myocardial infarction: Secondary | ICD-10-CM

## 2012-11-08 DIAGNOSIS — I480 Paroxysmal atrial fibrillation: Secondary | ICD-10-CM

## 2012-11-08 DIAGNOSIS — Z79899 Other long term (current) drug therapy: Secondary | ICD-10-CM

## 2012-11-08 DIAGNOSIS — F319 Bipolar disorder, unspecified: Secondary | ICD-10-CM | POA: Diagnosis present

## 2012-11-08 DIAGNOSIS — J4489 Other specified chronic obstructive pulmonary disease: Secondary | ICD-10-CM | POA: Diagnosis present

## 2012-11-08 DIAGNOSIS — M069 Rheumatoid arthritis, unspecified: Secondary | ICD-10-CM | POA: Diagnosis present

## 2012-11-08 DIAGNOSIS — J449 Chronic obstructive pulmonary disease, unspecified: Secondary | ICD-10-CM | POA: Diagnosis present

## 2012-11-08 DIAGNOSIS — F0390 Unspecified dementia without behavioral disturbance: Secondary | ICD-10-CM | POA: Diagnosis present

## 2012-11-08 DIAGNOSIS — R45851 Suicidal ideations: Secondary | ICD-10-CM

## 2012-11-08 DIAGNOSIS — Z6831 Body mass index (BMI) 31.0-31.9, adult: Secondary | ICD-10-CM

## 2012-11-08 DIAGNOSIS — K222 Esophageal obstruction: Secondary | ICD-10-CM | POA: Diagnosis present

## 2012-11-08 DIAGNOSIS — I4891 Unspecified atrial fibrillation: Secondary | ICD-10-CM | POA: Diagnosis present

## 2012-11-08 DIAGNOSIS — K224 Dyskinesia of esophagus: Secondary | ICD-10-CM | POA: Diagnosis present

## 2012-11-08 DIAGNOSIS — E876 Hypokalemia: Principal | ICD-10-CM

## 2012-11-08 DIAGNOSIS — E785 Hyperlipidemia, unspecified: Secondary | ICD-10-CM | POA: Diagnosis present

## 2012-11-08 HISTORY — DX: Headache: R51

## 2012-11-08 HISTORY — DX: Type 2 diabetes mellitus without complications: E11.9

## 2012-11-08 HISTORY — DX: Cardiac arrhythmia, unspecified: I49.9

## 2012-11-08 HISTORY — DX: Sarcoidosis, unspecified: D86.9

## 2012-11-08 LAB — CBC WITH DIFFERENTIAL/PLATELET
Eosinophils Absolute: 0 10*3/uL (ref 0.0–0.7)
Eosinophils Relative: 0 % (ref 0–5)
Hemoglobin: 13.9 g/dL (ref 13.0–17.0)
Lymphs Abs: 2.1 10*3/uL (ref 0.7–4.0)
MCH: 31.3 pg (ref 26.0–34.0)
MCV: 93.9 fL (ref 78.0–100.0)
Monocytes Relative: 13 % — ABNORMAL HIGH (ref 3–12)
RBC: 4.44 MIL/uL (ref 4.22–5.81)

## 2012-11-08 LAB — TROPONIN I: Troponin I: <0.3 ng/mL (ref <0.30)

## 2012-11-08 LAB — COMPREHENSIVE METABOLIC PANEL
Alkaline Phosphatase: 80 U/L (ref 39–117)
BUN: 13 mg/dL (ref 6–23)
Calcium: 9.7 mg/dL (ref 8.4–10.5)
GFR calc Af Amer: 76 mL/min — ABNORMAL LOW (ref >90)
Glucose, Bld: 84 mg/dL (ref 70–99)
Total Protein: 6.9 g/dL (ref 6.0–8.3)

## 2012-11-08 LAB — ACETAMINOPHEN LEVEL: Acetaminophen (Tylenol), Serum: <15 ug/mL (ref 10–30)

## 2012-11-08 LAB — CK: Total CK: 659 U/L — ABNORMAL HIGH (ref 7–232)

## 2012-11-08 MED ORDER — SODIUM CHLORIDE 0.9 % IV BOLUS (SEPSIS)
1000.0000 mL | INTRAVENOUS | Status: AC
  Administered 2012-11-08: 1000 mL via INTRAVENOUS

## 2012-11-08 MED ORDER — SODIUM CHLORIDE 0.9 % IV BOLUS (SEPSIS): 1000.0000 mL | INTRAVENOUS | Status: DC

## 2012-11-08 MED ORDER — POTASSIUM CHLORIDE 10 MEQ/100ML IV SOLN
10.0000 meq | Freq: Once | INTRAVENOUS | Status: AC
  Administered 2012-11-08: 10 meq via INTRAVENOUS
  Filled 2012-11-08: qty 100

## 2012-11-08 MED ORDER — POTASSIUM CHLORIDE CRYS ER 20 MEQ PO TBCR
40.0000 meq | EXTENDED_RELEASE_TABLET | Freq: Once | ORAL | Status: AC
  Administered 2012-11-08: 40 meq via ORAL
  Filled 2012-11-08: qty 2

## 2012-11-08 NOTE — ED Notes (Signed)
Staff tried to do an In and Out cath, there was no output.  NT notified RN. Staff cleaned pt's feet and groin area. Pt's feet was covered with dirt.
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
## 2012-11-08 NOTE — ED Notes (Signed)
Bed: RESB Expected date:  Expected time:  Means of arrival:  Comments: EMS, psychotic / maniac, SI

## 2012-11-08 NOTE — Telephone Encounter (Signed)
Spoke to [place] and requested she have [name] schedule an appointment with Dr Berline Chough.she stated she'll see him today and will have him schedule. An appointment. [name], [name]

## 2012-11-08 NOTE — ED Notes (Signed)
Pt has a brown belt, brown and black loafers, dark blue boxers, green t-shirt and a long black shirt.

## 2012-11-08 NOTE — ED Provider Notes (Signed)
CSN: [id number]     Arrival date & time 11/08/12  2058 History   First MD Initiated Contact with Patient 11/08/12 2111     Chief Complaint  Patient presents with  . Medical Clearance   (Consider location/radiation/quality/duration/timing/severity/associated sxs/prior Treatment) Patient is a 62 y.o. male presenting with neurologic complaint. The history is provided by the patient and the EMS personnel.  Neurologic Problem This is a recurrent problem. Episode onset: unknown. The problem occurs constantly. The problem has not changed since onset.Pertinent negatives include no chest pain, no abdominal pain, no headaches and no shortness of breath. Nothing aggravates the symptoms. Nothing relieves the symptoms. He has tried nothing for the symptoms. The treatment provided no relief.    Past Medical History  Diagnosis Date  . Rheumatoid arthritis(714.0)   . Obesity   . Major depression     with acute psychotic break in 06/2010  . Diabetes mellitus   . Hypertension   . Hyperlipidemia   . Diverticulosis of colon   . COPD (chronic obstructive pulmonary disease)   . Anxiety   . GERD (gastroesophageal reflux disease)   . Vertigo   . Seizures   . Fibromyalgia   . Dermatomyositis   . Myocardial infarct     mulitple (1999, 2000, 2004)  . Raynaud's disease   . Narcotic dependence   . Peripheral neuropathy   . Internal hemorrhoids   . Ischemic heart disease   . Hiatal hernia   . Gastritis   . Diverticulitis   . Hx of adenomatous colonic polyps   . Nephrolithiasis   . Anemia   . Esophageal stricture   . Esophageal dysmotility   . Dermatomyositis   . Paroxysmal a-fib   . Urge incontinence   . Otosclerosis   . Bipolar 1 disorder   . OCD (obsessive compulsive disorder)    Past Surgical History  Procedure Laterality Date  . Knee arthroscopy w/ meniscal repair  2009    left  . Lumbar disc surgery    . Squamous papilloma   2010    removed by Dr. Pollyann Kennedy ENT, noted on tongue  .  Esophagogastroduodenoscopy N/A 09/27/2012    Procedure: ESOPHAGOGASTRODUODENOSCOPY (EGD);  Surgeon: Hart Carwin, MD;  Location: [facility];  Service: Endoscopy;  Laterality: N/A;  . Colonoscopy N/A 09/27/2012    Procedure: COLONOSCOPY;  Surgeon: Hart Carwin, MD;  Location: [facility];  Service: Endoscopy;  Laterality: N/A;   Family History  Problem Relation Age of Onset  . Alcohol abuse Mother   . Depression Mother   . Heart disease Mother   . Diabetes Mother   . Stroke Mother   . Diabetes Other     1/2 brother  . Hepatitis Brother    History  Substance Use Topics  . Smoking status: Current Every Day Smoker -- 0.50 packs/day    Types: Cigarettes  . Smokeless tobacco: Never Used     Comment: PATIENT STATES HE QUIT SMOKING LAST WINTER   . Alcohol Use: No     Comment: daily    Review of Systems  Constitutional: Negative for fever.  HENT: Negative for rhinorrhea, drooling and neck pain.   Eyes: Negative for pain.  Respiratory: Negative for cough and shortness of breath.   Cardiovascular: Negative for chest pain and leg swelling.  Gastrointestinal: Negative for nausea, vomiting, abdominal pain and diarrhea.  Genitourinary: Negative for dysuria and hematuria.  Musculoskeletal: Negative for gait problem.  Skin: Negative for color change.  Neurological: Negative for  numbness and headaches.  Hematological: Negative for adenopathy.  Psychiatric/Behavioral: Negative for behavioral problems.  All other systems reviewed and are negative.    Allergies  Betamethasone dipropionate; Bupropion hcl; Ciprofloxacin; Clobetasol; Codeine; Escitalopram oxalate; Flagyl; Fluoxetine hcl; Fluticasone-salmeterol; Furosemide; Immune globulins; Paroxetine; Penicillins; Statins; Sulfa antibiotics; Tacrolimus; Tetanus toxoid; and Tuberculin purified protein derivative  Home Medications   Current Outpatient Rx  Name  Route  Sig  Dispense  Refill  . ALPRAZolam (XANAX) 1 MG tablet   Oral   Take  1 tablet (1 mg total) by mouth 3 (three) times daily as needed for sleep or anxiety.   90 tablet   0     MUST GET FURTHER REFILLS FROM PCP IF NEEDED   . amiodarone (PACERONE) 200 MG tablet   Oral   Take 1 tablet (200 mg total) by mouth daily before breakfast.   30 tablet   5   . aspirin EC 81 MG EC tablet   Oral   Take 1 tablet (81 mg total) by mouth daily.         . divalproex (DEPAKOTE ER) 500 MG 24 hr tablet   Oral   Take 1 tablet (500 mg total) by mouth 2 (two) times daily with a meal.         . esomeprazole (NEXIUM) 40 MG capsule   Oral   Take 40 mg by mouth daily before breakfast.         . Ipratropium-Albuterol (COMBIVENT) 20-100 MCG/ACT AERS respimat   Inhalation   Inhale 1 puff into the lungs every 6 (six) hours.         . levETIRAcetam (KEPPRA) 500 MG tablet   Oral   Take 1 tablet (500 mg total) by mouth 2 (two) times daily.         . methotrexate (RHEUMATREX) 2.5 MG tablet   Oral   Take 2.5 mg by mouth 3 (three) times a week. Caution:Chemotherapy. Protect from light.         . metoprolol succinate (TOPROL-XL) 12.5 mg TB24 24 hr tablet   Oral   Take 0.5 tablets (12.5 mg total) by mouth daily.         . nitroGLYCERIN (NITROSTAT) 0.4 MG SL tablet   Sublingual   Place 0.4 mg under the tongue every 5 (five) minutes as needed. For chest pain         . oxybutynin (DITROPAN) 5 MG tablet   Oral   Take 1 tablet (5 mg total) by mouth 3 (three) times daily.   90 tablet   3   . risperiDONE (RISPERDAL M-TABS) 0.5 MG disintegrating tablet   Oral   Take 1 tablet (0.5 mg total) by mouth 2 (two) times daily as needed (agitation).         Marland Kitchen rOPINIRole (REQUIP) 2 MG tablet   Oral   Take 1 tablet (2 mg total) by mouth 3 (three) times daily.   360 tablet   0     Needs to be addressed by Psychiatrist for further  ...   . torsemide (DEMADEX) 20 MG tablet   Oral   Take 1 tablet (20 mg total) by mouth daily.   30 tablet   2    BP 155/69   Temp(Src) 98.4 F (36.9 C) (Oral)  Resp 19  SpO2 99% Physical Exam  Nursing note and vitals reviewed. Constitutional: He is oriented to person, place, and time. He appears well-developed and well-nourished.  HENT:  Head: Normocephalic and atraumatic.  Right Ear: External ear normal.  Left Ear: External ear normal.  Nose: Nose normal.  Mouth/Throat: Oropharynx is clear and moist. No oropharyngeal exudate.  Eyes: Conjunctivae and EOM are normal. Pupils are equal, round, and reactive to light.  Neck: Normal range of motion. Neck supple.  Cardiovascular: Normal rate, regular rhythm, normal heart sounds and intact distal pulses.  Exam reveals no gallop and no friction rub.   No murmur heard. Pulmonary/Chest: Effort normal and breath sounds normal. No respiratory distress. He has no wheezes.  Abdominal: Soft. Bowel sounds are normal. He exhibits no distension. There is no tenderness. There is no rebound and no guarding.  Musculoskeletal: Normal range of motion. He exhibits no edema and no tenderness.  Neurological: He is alert and oriented to person, place, and time.  Skin: Skin is warm and dry. There is erythema (erythema on perineum and scrotum. ).  Psychiatric: His speech is tangential. He is hyperactive.    ED Course  Procedures (including critical care time) Labs Review Labs Reviewed  CBC WITH DIFFERENTIAL  COMPREHENSIVE METABOLIC PANEL  ACETAMINOPHEN LEVEL  SALICYLATE LEVEL  ETHANOL  URINALYSIS, ROUTINE W REFLEX MICROSCOPIC  URINE RAPID DRUG SCREEN (HOSP PERFORMED)  TROPONIN I  CK   Imaging Review Dg Chest 2 View  11/08/2012   CLINICAL DATA:  Altered mental status  EXAM: CHEST  2 VIEW  COMPARISON:  October 18, 2012  FINDINGS: There is relative flattening of the hemidiaphragms suggesting a degree of underlying emphysema. There is no edema or consolidation. Heart is upper normal in size with normal pulmonary vascularity. There is degenerative change in the thoracic spine.   IMPRESSION: Evidence of a degree of underlying emphysematous change. No edema or consolidation.   Electronically Signed   By: Bretta Bang   On: 11/08/2012 22:00   Ct Head Wo Contrast  11/08/2012   CLINICAL DATA:  Altered mental status  EXAM: CT HEAD WITHOUT CONTRAST  TECHNIQUE: Contiguous axial images were obtained from the base of the skull through the vertex without intravenous contrast.  COMPARISON:  10/16/2012  FINDINGS: Skull:No acute osseous abnormality. No lytic or blastic lesion.  Orbits: No acute abnormality.  Brain: No evidence of acute abnormality, such as acute infarction, hemorrhage, hydrocephalus, or mass lesion/mass effect. Unchanged pattern of patchy bilateral cerebral white matter low density, age advanced. There has been a remote lacunar infarct in the left upper cerebellum.  IMPRESSION: 1. No evidence of acute intracranial disease. 2. Age advanced chronic small vessel disease.   Electronically Signed   By: Tiburcio Pea   On: 11/08/2012 22:14     Date: 11/08/2012  Rate: 78  Rhythm: normal sinus rhythm  QRS Axis: normal  Intervals: QT prolonged  ST/T Wave abnormalities: ST depressions inferiorly, ST depressions anteriorly and ST depressions laterally  Conduction Disutrbances:none  Narrative Interpretation: ST dep's in inferior leads appear more prominent, otherwise unchanged  Old EKG Reviewed: changes noted   MDM   1. Hypokalemia   2. Dehydration   3. Contact dermatitis   4. Altered mental status    9:40 PM 62 y.o. male w an extensive psych history with major depressive disorder, anxiety, bipolar d/o and psychosis who pw AMS. The patient was recently admitted several weeks ago here for rhabdomyolysis and altered mental status. The patient presents via EMS tonight after a call from his neighbor due to erratic behavior. Per report pt was walking around his apt. EMS notes that the patient was saturated in urine and feces. The patient is  alert and oriented x3 here. He is  very tangential on exam but can be reoriented. He is afebrile and vital signs are unremarkable. Will get screening labwork, IV fluid hydration. He denies SI/HI, auditory/visual hallucinations.   Nurse noting pt having paranoid thoughts about [employer]. Pt found to have contact dermatitis on perineum and scrotum. Applied topical barrier.    Will give Kdur 40 meq po and potassium 10 meq IV now. Will admit to family medicine teaching service.   Junius Argyle, MD 11/09/12 [phone]

## 2012-11-09 ENCOUNTER — Encounter (HOSPITAL_COMMUNITY): Payer: Self-pay | Admitting: *Deleted

## 2012-11-09 DIAGNOSIS — L259 Unspecified contact dermatitis, unspecified cause: Secondary | ICD-10-CM | POA: Diagnosis present

## 2012-11-09 DIAGNOSIS — F17200 Nicotine dependence, unspecified, uncomplicated: Secondary | ICD-10-CM

## 2012-11-09 DIAGNOSIS — E119 Type 2 diabetes mellitus without complications: Secondary | ICD-10-CM

## 2012-11-09 DIAGNOSIS — I4891 Unspecified atrial fibrillation: Secondary | ICD-10-CM

## 2012-11-09 DIAGNOSIS — R4182 Altered mental status, unspecified: Secondary | ICD-10-CM | POA: Diagnosis present

## 2012-11-09 DIAGNOSIS — F319 Bipolar disorder, unspecified: Secondary | ICD-10-CM

## 2012-11-09 DIAGNOSIS — E860 Dehydration: Secondary | ICD-10-CM | POA: Diagnosis present

## 2012-11-09 LAB — BASIC METABOLIC PANEL
BUN: 11 mg/dL (ref 6–23)
CO2: 26 mEq/L (ref 19–32)
Calcium: 8.9 mg/dL (ref 8.4–10.5)
Chloride: 108 mEq/L (ref 96–112)
Creatinine, Ser: 1.05 mg/dL (ref 0.50–1.35)
GFR calc Af Amer: 85 mL/min — ABNORMAL LOW (ref >90)
Glucose, Bld: 122 mg/dL — ABNORMAL HIGH (ref 70–99)

## 2012-11-09 LAB — RAPID URINE DRUG SCREEN, HOSP PERFORMED
Amphetamines: NOT DETECTED
Barbiturates: NOT DETECTED
Tetrahydrocannabinol: NOT DETECTED

## 2012-11-09 LAB — URINE MICROSCOPIC-ADD ON

## 2012-11-09 LAB — URINALYSIS, ROUTINE W REFLEX MICROSCOPIC
Glucose, UA: NEGATIVE mg/dL
Ketones, ur: 15 mg/dL — AB
Protein, ur: 30 mg/dL — AB

## 2012-11-09 MED ORDER — ASPIRIN EC 81 MG PO TBEC
81.0000 mg | DELAYED_RELEASE_TABLET | Freq: Every day | ORAL | Status: DC
  Administered 2012-11-09 – 2012-11-16 (×8): 81 mg via ORAL
  Filled 2012-11-09 (×8): qty 1

## 2012-11-09 MED ORDER — PANTOPRAZOLE SODIUM 40 MG PO TBEC
40.0000 mg | DELAYED_RELEASE_TABLET | Freq: Every day | ORAL | Status: DC
  Administered 2012-11-09 – 2012-11-16 (×8): 40 mg via ORAL
  Filled 2012-11-09 (×8): qty 1

## 2012-11-09 MED ORDER — DIVALPROEX SODIUM 500 MG PO DR TAB
750.0000 mg | DELAYED_RELEASE_TABLET | Freq: Two times a day (BID) | ORAL | Status: DC
  Administered 2012-11-10 – 2012-11-16 (×13): 750 mg via ORAL
  Filled 2012-11-09 (×17): qty 1

## 2012-11-09 MED ORDER — IPRATROPIUM-ALBUTEROL 20-100 MCG/ACT IN AERS
1.0000 | INHALATION_SPRAY | Freq: Four times a day (QID) | RESPIRATORY_TRACT | Status: DC
  Administered 2012-11-09 – 2012-11-15 (×19): 1 via RESPIRATORY_TRACT
  Filled 2012-11-09: qty 4

## 2012-11-09 MED ORDER — RISPERIDONE 0.5 MG PO TBDP
0.5000 mg | ORAL_TABLET | Freq: Two times a day (BID) | ORAL | Status: DC
  Administered 2012-11-09: 0.5 mg via ORAL
  Filled 2012-11-09 (×2): qty 1

## 2012-11-09 MED ORDER — METHOTREXATE 2.5 MG PO TABS
2.5000 mg | ORAL_TABLET | ORAL | Status: DC
  Administered 2012-11-10 – 2012-11-15 (×3): 2.5 mg via ORAL
  Filled 2012-11-09 (×6): qty 1

## 2012-11-09 MED ORDER — KCL IN DEXTROSE-NACL 20-5-0.45 MEQ/L-%-% IV SOLN
INTRAVENOUS | Status: DC
  Administered 2012-11-09 – 2012-11-10 (×4): via INTRAVENOUS
  Filled 2012-11-09 (×11): qty 1000

## 2012-11-09 MED ORDER — POTASSIUM CHLORIDE CRYS ER 20 MEQ PO TBCR
40.0000 meq | EXTENDED_RELEASE_TABLET | Freq: Three times a day (TID) | ORAL | Status: AC
  Administered 2012-11-09 (×3): 40 meq via ORAL
  Filled 2012-11-09 (×3): qty 2

## 2012-11-09 MED ORDER — ROPINIROLE HCL 1 MG PO TABS
2.0000 mg | ORAL_TABLET | Freq: Three times a day (TID) | ORAL | Status: DC
  Filled 2012-11-09 (×3): qty 2

## 2012-11-09 MED ORDER — LEVETIRACETAM 500 MG PO TABS
500.0000 mg | ORAL_TABLET | Freq: Two times a day (BID) | ORAL | Status: DC
  Administered 2012-11-09 – 2012-11-16 (×15): 500 mg via ORAL
  Filled 2012-11-09 (×17): qty 1

## 2012-11-09 MED ORDER — DIVALPROEX SODIUM ER 500 MG PO TB24
500.0000 mg | ORAL_TABLET | Freq: Two times a day (BID) | ORAL | Status: DC
  Filled 2012-11-09 (×2): qty 1

## 2012-11-09 MED ORDER — ALPRAZOLAM 0.5 MG PO TABS
1.0000 mg | ORAL_TABLET | Freq: Three times a day (TID) | ORAL | Status: DC | PRN
  Administered 2012-11-09 – 2012-11-15 (×7): 1 mg via ORAL
  Filled 2012-11-09 (×7): qty 2

## 2012-11-09 MED ORDER — BARRIER CREAM NON-SPECIFIED
1.0000 "application " | TOPICAL_CREAM | Freq: Two times a day (BID) | TOPICAL | Status: DC | PRN
  Filled 2012-11-09: qty 1

## 2012-11-09 MED ORDER — METOPROLOL SUCCINATE 12.5 MG HALF TABLET
12.5000 mg | ORAL_TABLET | Freq: Every day | ORAL | Status: DC
  Administered 2012-11-10 – 2012-11-16 (×7): 12.5 mg via ORAL
  Filled 2012-11-09 (×8): qty 1

## 2012-11-09 MED ORDER — DIVALPROEX SODIUM 500 MG PO DR TAB
500.0000 mg | DELAYED_RELEASE_TABLET | Freq: Two times a day (BID) | ORAL | Status: DC
  Filled 2012-11-09 (×2): qty 1

## 2012-11-09 MED ORDER — SODIUM CHLORIDE 0.9 % IV SOLN
INTRAVENOUS | Status: DC
  Administered 2012-11-09: 09:00:00 via INTRAVENOUS

## 2012-11-09 MED ORDER — OXYBUTYNIN CHLORIDE 5 MG PO TABS
5.0000 mg | ORAL_TABLET | Freq: Three times a day (TID) | ORAL | Status: DC
  Administered 2012-11-09 – 2012-11-16 (×22): 5 mg via ORAL
  Filled 2012-11-09 (×24): qty 1

## 2012-11-09 MED ORDER — ENOXAPARIN SODIUM 40 MG/0.4ML ~~LOC~~ SOLN
40.0000 mg | SUBCUTANEOUS | Status: DC
  Administered 2012-11-09 – 2012-11-16 (×8): 40 mg via SUBCUTANEOUS
  Filled 2012-11-09 (×8): qty 0.4

## 2012-11-09 MED ORDER — AMIODARONE HCL 200 MG PO TABS
200.0000 mg | ORAL_TABLET | Freq: Every day | ORAL | Status: DC
  Administered 2012-11-10 – 2012-11-16 (×7): 200 mg via ORAL
  Filled 2012-11-09 (×8): qty 1

## 2012-11-09 MED ORDER — RISPERIDONE 1 MG PO TBDP
1.0000 mg | ORAL_TABLET | Freq: Two times a day (BID) | ORAL | Status: DC
  Administered 2012-11-09 – 2012-11-16 (×14): 1 mg via ORAL
  Filled 2012-11-09 (×16): qty 1

## 2012-11-09 NOTE — Progress Notes (Signed)
Utilization Review Completed.Patrick Holt T9/25/2014  
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
## 2012-11-09 NOTE — ED Notes (Signed)
Patient c/o his left hand burning while potassium is infusing. Rate of infusion decreased to 50 ml/hr; patient continues to c/o burning. 2nd IV site established.

## 2012-11-09 NOTE — Consult Note (Signed)
Reason for Consult:Assess for inpatient hospitalization Referring Physician: Hilarie Fredrickson, MD   Patrick Holt is an 62 y.o. male.  HPI: Patient is seen and chart reviewed. Patient is known to this provider from his previous psychiatric consultation on the medical floor. Patient did appear to be deteriorated Clinically and functionally. Patient patient has been suffering with the hyperal mania was his manic symptoms including increased energy, pressured speech, and losing of Associations and flight of ideations. Patient has increase in goal-directed activities, mostly presentable and decreased need for sleep and irritability. Patient reported he was initially presented to the [facility] long emergency department secondary to found unconscious in his room. Patient reported he has been staying up all night several days now watching daily vision she was and not able to eat and drink or function at home. He is currently paranoid/delusional and unable to give full history. He continues to perseverate on losing weight, being unable to see and also mentioning world peace. Patient was medically admitted because he was found to be profoundly hypokalemic therefore transferred to [facility] medicine service. He states he has not taken his medications at home, but it is not clear if this is true. (Although there is a telephone note from [facility] requesting RN at home to assist with medications.)   Mental Status Examination: Patient is an awake, alert and oriented. Patient is calm uncooperative he needed frequent redirection because of increased to tangential and circumstantial often his thought process. He has fair eye contact. Patient has good mood and his affect was labile. he has increased rate of speech but normal rhythm and volume. tient has denied suicidal, homicidal ideations, intentions or plans. Patient has no evidence of auditory or visual hallucinations, delusions, and paranoia. Patient has fair insight judgment and  impulse control.  Past Medical History  Diagnosis Date  . Rheumatoid arthritis(714.0)   . Obesity   . Major depression     with acute psychotic break in 06/2010  . Diabetes mellitus   . Hypertension   . Hyperlipidemia   . Diverticulosis of colon   . COPD (chronic obstructive pulmonary disease)   . Anxiety   . GERD (gastroesophageal reflux disease)   . Vertigo   . Seizures   . Fibromyalgia   . Dermatomyositis   . Myocardial infarct     mulitple (1999, 2000, 2004)  . Raynaud's disease   . Narcotic dependence   . Peripheral neuropathy   . Internal hemorrhoids   . Ischemic heart disease   . Hiatal hernia   . Gastritis   . Diverticulitis   . Hx of adenomatous colonic polyps   . Nephrolithiasis   . Anemia   . Esophageal stricture   . Esophageal dysmotility   . Dermatomyositis   . Paroxysmal a-fib   . Urge incontinence   . Otosclerosis   . Bipolar 1 disorder   . OCD (obsessive compulsive disorder)     Past Surgical History  Procedure Laterality Date  . Knee arthroscopy w/ meniscal repair  2009    left  . Lumbar disc surgery    . Squamous papilloma   2010    removed by Dr. Pollyann Kennedy ENT, noted on tongue  . Esophagogastroduodenoscopy N/A 09/27/2012    Procedure: ESOPHAGOGASTRODUODENOSCOPY (EGD);  Surgeon: Hart Carwin, MD;  Location: [facility];  Service: Endoscopy;  Laterality: N/A;  . Colonoscopy N/A 09/27/2012    Procedure: COLONOSCOPY;  Surgeon: Hart Carwin, MD;  Location: [facility] ENDOSCOPY;  Service: Endoscopy;  Laterality: N/A;  Family History  Problem Relation Age of Onset  . Alcohol abuse Mother   . Depression Mother   . Heart disease Mother   . Diabetes Mother   . Stroke Mother   . Diabetes Other     1/2 brother  . Hepatitis Brother     Social History:  reports that he has been smoking Cigarettes.  He has been smoking about 0.50 packs per day. He has never used smokeless tobacco. He reports that he does not drink alcohol or use illicit  drugs.  Allergies:  Allergies  Allergen Reactions  . Betamethasone Dipropionate     Unknown  . Bupropion Hcl     Unknown  . Ciprofloxacin     REACTION: swelling  . Clobetasol     Unknown  . Codeine     Unknown  . Escitalopram Oxalate     Unknown  . Flagyl [Metronidazole]     REACTION: swelling  . Fluoxetine Hcl     Unknown  . Fluticasone-Salmeterol     Unknown  . Furosemide     Unknown  . Immune Globulins     Acute renal failure  . Paroxetine     Unknown  . Penicillins     Unknown  . Statins     Unknown  . Sulfa Antibiotics   . Tacrolimus     Unknown  . Tetanus Toxoid     Unknown  . Tuberculin Purified Protein Derivative     Unknown    Medications: I have reviewed the patient's current medications.  Results for orders placed during the hospital encounter of 11/08/12 (from the past 48 hour(s))  CBC WITH DIFFERENTIAL     Status: Abnormal   Collection Time    11/08/12 10:30 PM      Result Value Range   WBC 8.2  4.0 - 10.5 K/uL   RBC 4.44  4.22 - 5.81 MIL/uL   Hemoglobin 13.9  13.0 - 17.0 g/dL   HCT 16.1  09.6 - 04.5 %   MCV 93.9  78.0 - 100.0 fL   MCH 31.3  26.0 - 34.0 pg   MCHC 33.3  30.0 - 36.0 g/dL   RDW 40.9  81.1 - 91.4 %   Platelets 277  150 - 400 K/uL   Neutrophils Relative % 61  43 - 77 %   Neutro Abs 5.0  1.7 - 7.7 K/uL   Lymphocytes Relative 25  12 - 46 %   Lymphs Abs 2.1  0.7 - 4.0 K/uL   Monocytes Relative 13 (*) 3 - 12 %   Monocytes Absolute 1.1 (*) 0.1 - 1.0 K/uL   Eosinophils Relative 0  0 - 5 %   Eosinophils Absolute 0.0  0.0 - 0.7 K/uL   Basophils Relative 0  0 - 1 %   Basophils Absolute 0.0  0.0 - 0.1 K/uL  COMPREHENSIVE METABOLIC PANEL     Status: Abnormal   Collection Time    11/08/12 10:30 PM      Result Value Range   Sodium 143  135 - 145 mEq/L   Potassium 2.5 (*) 3.5 - 5.1 mEq/L   Comment: CRITICAL RESULT CALLED TO, READ BACK BY AND VERIFIED WITH:     NEILSON,T RN @ 2317 ON 9.24.2014 BY MCREYNOLDS,B   Chloride 102  96 -  112 mEq/L   CO2 28  19 - 32 mEq/L   Glucose, Bld 84  70 - 99 mg/dL   BUN 13  6 - 23 mg/dL  Creatinine, Ser 1.16  0.50 - 1.35 mg/dL   Calcium 9.7  8.4 - 16.1 mg/dL   Total Protein 6.9  6.0 - 8.3 g/dL   Albumin 3.7  3.5 - 5.2 g/dL   AST 31  0 - 37 U/L   ALT 25  0 - 53 U/L   Alkaline Phosphatase 80  39 - 117 U/L   Total Bilirubin 0.8  0.3 - 1.2 mg/dL   GFR calc non Af Amer 65 (*) >90 mL/min   GFR calc Af Amer 76 (*) >90 mL/min   Comment: (NOTE)     The eGFR has been calculated using the CKD EPI equation.     This calculation has not been validated in all clinical situations.     eGFR's persistently <90 mL/min signify possible Chronic Kidney     Disease.  ACETAMINOPHEN LEVEL     Status: None   Collection Time    11/08/12 10:30 PM      Result Value Range   Acetaminophen (Tylenol), Serum <15.0  10 - 30 ug/mL   Comment:            THERAPEUTIC CONCENTRATIONS VARY     SIGNIFICANTLY. A RANGE OF 10-30     ug/mL MAY BE AN EFFECTIVE     CONCENTRATION FOR MANY PATIENTS.     HOWEVER, SOME ARE BEST TREATED     AT CONCENTRATIONS OUTSIDE THIS     RANGE.     ACETAMINOPHEN CONCENTRATIONS     >150 ug/mL AT 4 HOURS AFTER     INGESTION AND >50 ug/mL AT 12     HOURS AFTER INGESTION ARE     OFTEN ASSOCIATED WITH TOXIC     REACTIONS.  SALICYLATE LEVEL     Status: Abnormal   Collection Time    11/08/12 10:30 PM      Result Value Range   Salicylate Lvl <2.0 (*) 2.8 - 20.0 mg/dL  ETHANOL     Status: None   Collection Time    11/08/12 10:30 PM      Result Value Range   Alcohol, Ethyl (B) <11  0 - 11 mg/dL   Comment:            LOWEST DETECTABLE LIMIT FOR     SERUM ALCOHOL IS 11 mg/dL     FOR MEDICAL PURPOSES ONLY  TROPONIN I     Status: None   Collection Time    11/08/12 10:30 PM      Result Value Range   Troponin I <0.30  <0.30 ng/mL   Comment:            Due to the release kinetics of cTnI,     a negative result within the first hours     of the onset of symptoms does not rule out      myocardial infarction with certainty.     If myocardial infarction is still suspected,     repeat the test at appropriate intervals.  CK     Status: Abnormal   Collection Time    11/08/12 10:30 PM      Result Value Range   Total CK 659 (*) 7 - 232 U/L  AMMONIA     Status: None   Collection Time    11/08/12 10:30 PM      Result Value Range   Ammonia 42  11 - 60 umol/L  MAGNESIUM     Status: None   Collection Time    11/08/12 10:30  PM      Result Value Range   Magnesium 2.0  1.5 - 2.5 mg/dL  URINALYSIS, ROUTINE W REFLEX MICROSCOPIC     Status: Abnormal   Collection Time    11/09/12 12:10 AM      Result Value Range   Color, Urine AMBER (*) YELLOW   Comment: BIOCHEMICALS MAY BE AFFECTED BY COLOR   APPearance CLOUDY (*) CLEAR   Specific Gravity, Urine 1.028  1.005 - 1.030   pH 6.5  5.0 - 8.0   Glucose, UA NEGATIVE  NEGATIVE mg/dL   Hgb urine dipstick MODERATE (*) NEGATIVE   Bilirubin Urine SMALL (*) NEGATIVE   Ketones, ur 15 (*) NEGATIVE mg/dL   Protein, ur 30 (*) NEGATIVE mg/dL   Urobilinogen, UA 1.0  0.0 - 1.0 mg/dL   Nitrite NEGATIVE  NEGATIVE   Leukocytes, UA SMALL (*) NEGATIVE  URINE RAPID DRUG SCREEN (HOSP PERFORMED)     Status: None   Collection Time    11/09/12 12:10 AM      Result Value Range   Opiates NONE DETECTED  NONE DETECTED   Cocaine NONE DETECTED  NONE DETECTED   Benzodiazepines NONE DETECTED  NONE DETECTED   Amphetamines NONE DETECTED  NONE DETECTED   Tetrahydrocannabinol NONE DETECTED  NONE DETECTED   Barbiturates NONE DETECTED  NONE DETECTED   Comment:            DRUG SCREEN FOR MEDICAL PURPOSES     ONLY.  IF CONFIRMATION IS NEEDED     FOR ANY PURPOSE, NOTIFY LAB     WITHIN 5 DAYS.                LOWEST DETECTABLE LIMITS     FOR URINE DRUG SCREEN     Drug Class       Cutoff (ng/mL)     Amphetamine      1000     Barbiturate      200     Benzodiazepine   200     Tricyclics       300     Opiates          300     Cocaine          300     THC               50  URINE MICROSCOPIC-ADD ON     Status: Abnormal   Collection Time    11/09/12 12:10 AM      Result Value Range   Squamous Epithelial / LPF RARE  RARE   WBC, UA 21-50  <3 WBC/hpf   RBC / HPF 21-50  <3 RBC/hpf   Casts HYALINE CASTS (*) NEGATIVE  BASIC METABOLIC PANEL     Status: Abnormal   Collection Time    11/09/12 10:35 AM      Result Value Range   Sodium 143  135 - 145 mEq/L   Potassium 2.8 (*) 3.5 - 5.1 mEq/L   Chloride 108  96 - 112 mEq/L   CO2 26  19 - 32 mEq/L   Glucose, Bld 122 (*) 70 - 99 mg/dL   BUN 11  6 - 23 mg/dL   Creatinine, Ser 1.61  0.50 - 1.35 mg/dL   Calcium 8.9  8.4 - 09.6 mg/dL   GFR calc non Af Amer 74 (*) >90 mL/min   GFR calc Af Amer 85 (*) >90 mL/min   Comment: (NOTE)     The eGFR  has been calculated using the CKD EPI equation.     This calculation has not been validated in all clinical situations.     eGFR's persistently <90 mL/min signify possible Chronic Kidney     Disease.  VALPROIC ACID LEVEL     Status: Abnormal   Collection Time    11/09/12 10:35 AM      Result Value Range   Valproic Acid Lvl <10.0 (*) 50.0 - 100.0 ug/mL    Dg Chest 2 View  11/08/2012   CLINICAL DATA:  Altered mental status  EXAM: CHEST  2 VIEW  COMPARISON:  October 18, 2012  FINDINGS: There is relative flattening of the hemidiaphragms suggesting a degree of underlying emphysema. There is no edema or consolidation. Heart is upper normal in size with normal pulmonary vascularity. There is degenerative change in the thoracic spine.  IMPRESSION: Evidence of a degree of underlying emphysematous change. No edema or consolidation.   Electronically Signed   By: Bretta Bang   On: 11/08/2012 22:00   Ct Head Wo Contrast  11/08/2012   CLINICAL DATA:  Altered mental status  EXAM: CT HEAD WITHOUT CONTRAST  TECHNIQUE: Contiguous axial images were obtained from the base of the skull through the vertex without intravenous contrast.  COMPARISON:  10/16/2012  FINDINGS: Skull:No  acute osseous abnormality. No lytic or blastic lesion.  Orbits: No acute abnormality.  Brain: No evidence of acute abnormality, such as acute infarction, hemorrhage, hydrocephalus, or mass lesion/mass effect. Unchanged pattern of patchy bilateral cerebral white matter low density, age advanced. There has been a remote lacunar infarct in the left upper cerebellum.  IMPRESSION: 1. No evidence of acute intracranial disease. 2. Age advanced chronic small vessel disease.   Electronically Signed   By: Tiburcio Pea   On: 11/08/2012 22:14    Positive for anxiety, behavior problems, bipolar, mood swings and sleep disturbance Blood pressure 131/76, pulse 86, temperature 98.9 F (37.2 C), temperature source Oral, resp. rate 20, weight 82.237 kg (181 lb 4.8 oz), SpO2 100.00%.   Assessment/Plan: Bipolar disorder most recent episode is unspecified  Recommended to acute psychiatric hospitalization for medication management Increase Depakote to 750 mg twice daily Increase risperidone and diabetes 1 mg twice daily Patient may benefit skilled nursing facility where medication management  Case discussed with referring physician Appreciate psychiatric consultation  Ermal Haberer,JANARDHAHA R. 11/09/2012, 3:16 PM

## 2012-11-09 NOTE — H&P (Signed)
Attending Addendum  I examined the patient and discussed the assessment and plan with Dr. Mikel Cella. I have reviewed the note and agree.  The patient is sutured 62-year-old male with a history of depression with psychotic features and suicidal ideation who presents as transfer for the wall after his neighbor called EMS. Patient was found to be acutely psychotic with flight of ideas, tangential thinking, and delusions. He is alert oriented and calm. He was transferred from was allowed ED to to our service in order to treat his hypokalemia. Exam is normal and notable for a edentulous state and psychotic features as noted above. I agree with assessment and plan of my resident. Will consult inpatient psych hopefully the patient will be to be transferred to inpatient psych tomorrow once his hypokalemia has resolved.     Dessa Phi, MD [facility]

## 2012-11-09 NOTE — H&P (Signed)
Family Medicine Teaching [facility] Admission History and Physical Service Pager: [phone]  Patient name: Patrick Holt Medical record number: [id number] Date of birth: 05-20-1950 Age: 62 y.o. Gender: male  Primary Care Provider: Gaspar Bidding, DO Consultants: None Code Status: Full  Chief Complaint: Hypokalemia  Assessment and Plan: Patrick Holt is a 62 y.o. male admitted from [facility] ED for medical clearance due to hypokalemia. PMH is significant for extensive psych history including major depressive disorder, anxiety and psychotic disorder.  # Hypokalemia - Patient found to be hypokalemic to 2.5 on Bmet in ED while undergoing evaluation for [facility] admission. He has had this issue in the past. Magnesium wnl. On review of medications, he is on Torsemide at home although not sure if/when he is actually taking this. EKG revealed no changes. S/p IV and Kdur x1 prior to arrival. - Admit to med-surg, video monitor room with sitter. Attending Dr. Armen Pickup - Hold Torsemide - Kdur x3 - Recheck Bmet now and in the morning - EKG in AM  # Psychosis - Patient is currently paranoid and having tangential thoughts. He states he has not taken his medications because he has "been losing so much weight." Patient is unable to tell me if he has taken any medications lately.  - Restart risperidone scheduled BID (rather than prn) - Xanax TID prn for anxiety - Consult to psych for evaluation for [facility] admission once hypokalemia resolves - D/c scheduled Requip - Currently, patient is pleasant and not agitated. Can add Haldol if needed for changes in behavior, but this has not been ordered at admission  # Elevated CK - Patient discharged from hospital on 9/1 for elevated CK. At that time it was trending down, and currently in the 600's - IVF at 100cc/hr - Troponin negative - No further work up  # Dermatitis - Patient has dermatitis of his groin, likely secondary to  incontinence  - Barrier cream prn for irritation  # History of A.fib- Sinus rhythm now. - Continue metoprolol and amiodarone  # Tobacco abuse - Chart states he has been a tobacco user, I was unable to get a straightforward answer - Consider adding nicotine patch if becomes agitated   # Seizure disorder - Known history of seizures. Denies any recent activity - Depakote level since we are unsure about medication use - Continue Depakote and Keppra - Head CT wnl, no further imaging needed  # Rheumatoid Arthritis - On Methotrexate three times per week, I assume for RA. - Continue methotrexate MWF  # Social concerns - It seems patient is not able to care for himself well at home. He was discharged to SNF at last hospitalization. Patient may need to be admitted to behavioral health, but will see how patient does with restarting medications - PT/OT - CSW to begin process  FEN/GI: D51/2NS + KCL at 100 cc/hr  Prophylaxis: Lovenox, but if unable to tolerate injection can place SCD  Disposition: Pending hospital course to determine [facility] vs. SNF. Patient most likely benefit from [facility].  History of Present Illness: Patrick Holt is a 62 y.o. male presenting from [facility] ED for medical clearance, found to be profoundly hypokalemic therefore transferred to [facility] medicine service. Patient initially presented to [facility] via EMS after a neighbor called for him acting strange at home. He is currently paranoid/delusional and unable to give full history. He continues to perseverate on losing weight, being unable to see and also mentioning world peace.  He states he has not taken his medications at home, but it is not clear if this is true. (Although there is a telephone note from [facility] requesting RN at home to assist with medications.) Patient currently has no physical complaints.  Review Of Systems: Per HPI with the following additions: None Otherwise 12 point review of systems was  performed and was unremarkable.  Patient Active Problem List   Diagnosis Date Noted  . Dehydration 11/09/2012  . Contact dermatitis 11/09/2012  . Altered mental status 11/09/2012  . Type I (juvenile type) diabetes mellitus with peripheral circulatory disorders, not stated as uncontrolled(250.71) 10/30/2012  . PAF (paroxysmal atrial fibrillation) 10/17/2012  . Hypokalemia 10/03/2012  . Benign neoplasm of colon 09/27/2012  . Stricture and stenosis of esophagus 09/27/2012  . Abdominal pain, left lower quadrant 09/26/2012  . Diverticulitis of colon without hemorrhage 07/26/2012  . Dystrophic nail 07/03/2012  . Polypharmacy 03/26/2012  . Thought disorder 02/21/2012    Class: Chronic  . 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 Psychotic disorder 02/14/2012  . Seizures 02/10/2012  . Atrial fibrillation 02/05/2012  . Coronary atherosclerosis of native coronary artery 02/05/2012  . High risk social situation 10/15/2011  . Ear pain 03/10/2011  . Dental attrition, excessive 03/10/2011  . Radicular low back pain 03/09/2011  . History of seizure disorder 01/29/2011  . Irritable bowel syndrome (IBS) 06/24/2010  . Urge incontinence 06/19/2010  . TOBACCO USER 11/23/2008  . OBESITY, UNSPECIFIED 07/25/2008  . DEPRESSION, MAJOR 05/31/2008  . ISCHEMIC HEART DISEASE 05/31/2008  . RAYNAUD'S DISEASE 05/31/2008  . HIATAL HERNIA 05/31/2008  . Rheumatoid arthritis(714.0) 05/31/2008  . FIBROMYALGIA 05/31/2008  . Generalized pain 05/31/2008  . DIABETES MELLITUS, TYPE II 01/26/2008  . HYPERLIPIDEMIA 01/26/2008  . ANXIETY, CHRONIC 01/26/2008  . HYPERTENSION 01/26/2008  . HEMORRHOIDS, INTERNAL 01/26/2008  . COPD 01/26/2008  . ESOPHAGEAL MOTILITY DISORDER 01/26/2008  . GERD 01/26/2008  . DIVERTICULOSIS OF COLON 01/26/2008  . Dermatomyositis 01/26/2008   Past Medical History: Past Medical History  Diagnosis Date  . Rheumatoid arthritis(714.0)   . Obesity   . Major  depression     with acute psychotic break in 06/2010  . Diabetes mellitus   . Hypertension   . Hyperlipidemia   . Diverticulosis of colon   . COPD (chronic obstructive pulmonary disease)   . Anxiety   . GERD (gastroesophageal reflux disease)   . Vertigo   . Seizures   . Fibromyalgia   . Dermatomyositis   . Myocardial infarct     mulitple (1999, 2000, 2004)  . Raynaud's disease   . Narcotic dependence   . Peripheral neuropathy   . Internal hemorrhoids   . Ischemic heart disease   . Hiatal hernia   . Gastritis   . Diverticulitis   . Hx of adenomatous colonic polyps   . Nephrolithiasis   . Anemia   . Esophageal stricture   . Esophageal dysmotility   . Dermatomyositis   . Paroxysmal a-fib   . Urge incontinence   . Otosclerosis   . Bipolar 1 disorder   . OCD (obsessive compulsive disorder)    Past Surgical History: Past Surgical History  Procedure Laterality Date  . Knee arthroscopy w/ meniscal repair  2009    left  . Lumbar disc surgery    . Squamous papilloma   2010    removed by Dr. Pollyann Kennedy ENT, noted on tongue  . Esophagogastroduodenoscopy N/A 09/27/2012    Procedure: ESOPHAGOGASTRODUODENOSCOPY (EGD);  Surgeon:  Hart Carwin, MD;  Location: [facility];  Service: Endoscopy;  Laterality: N/A;  . Colonoscopy N/A 09/27/2012    Procedure: COLONOSCOPY;  Surgeon: Hart Carwin, MD;  Location: [facility] ENDOSCOPY;  Service: Endoscopy;  Laterality: N/A;   Social History: History  Substance Use Topics  . Smoking status: Current Every Day Smoker -- 0.50 packs/day    Types: Cigarettes  . Smokeless tobacco: Never Used     Comment: PATIENT STATES HE QUIT SMOKING LAST WINTER   . Alcohol Use: No     Comment: daily   Additional social history: Lives alone  Please also refer to relevant sections of EMR.  Family History: Family History  Problem Relation Age of Onset  . Alcohol abuse Mother   . Depression Mother   . Heart disease Mother   . Diabetes Mother   . Stroke Mother   .  Diabetes Other     1/2 brother  . Hepatitis Brother    Allergies and Medications: Allergies  Allergen Reactions  . Betamethasone Dipropionate     Unknown  . Bupropion Hcl     Unknown  . Ciprofloxacin     REACTION: swelling  . Clobetasol     Unknown  . Codeine     Unknown  . Escitalopram Oxalate     Unknown  . Flagyl [Metronidazole]     REACTION: swelling  . Fluoxetine Hcl     Unknown  . Fluticasone-Salmeterol     Unknown  . Furosemide     Unknown  . Immune Globulins     Acute renal failure  . Paroxetine     Unknown  . Penicillins     Unknown  . Statins     Unknown  . Sulfa Antibiotics   . Tacrolimus     Unknown  . Tetanus Toxoid     Unknown  . Tuberculin Purified Protein Derivative     Unknown   No current facility-administered medications on file prior to encounter.   Current Outpatient Prescriptions on File Prior to Encounter  Medication Sig Dispense Refill  . ALPRAZolam (XANAX) 1 MG tablet Take 1 tablet (1 mg total) by mouth 3 (three) times daily as needed for sleep or anxiety.  90 tablet  0  . amiodarone (PACERONE) 200 MG tablet Take 1 tablet (200 mg total) by mouth daily before breakfast.  30 tablet  5  . aspirin EC 81 MG EC tablet Take 1 tablet (81 mg total) by mouth daily.      . divalproex (DEPAKOTE ER) 500 MG 24 hr tablet Take 1 tablet (500 mg total) by mouth 2 (two) times daily with a meal.      . esomeprazole (NEXIUM) 40 MG capsule Take 40 mg by mouth daily before breakfast.      . Ipratropium-Albuterol (COMBIVENT) 20-100 MCG/ACT AERS respimat Inhale 1 puff into the lungs every 6 (six) hours.      . levETIRAcetam (KEPPRA) 500 MG tablet Take 1 tablet (500 mg total) by mouth 2 (two) times daily.      . methotrexate (RHEUMATREX) 2.5 MG tablet Take 2.5 mg by mouth 3 (three) times a week. Caution:Chemotherapy. Protect from light.      . metoprolol succinate (TOPROL-XL) 12.5 mg TB24 24 hr tablet Take 0.5 tablets (12.5 mg total) by mouth daily.      .  nitroGLYCERIN (NITROSTAT) 0.4 MG SL tablet Place 0.4 mg under the tongue every 5 (five) minutes as needed. For chest pain      .  oxybutynin (DITROPAN) 5 MG tablet Take 1 tablet (5 mg total) by mouth 3 (three) times daily.  90 tablet  3  . risperiDONE (RISPERDAL M-TABS) 0.5 MG disintegrating tablet Take 1 tablet (0.5 mg total) by mouth 2 (two) times daily as needed (agitation).      Marland Kitchen rOPINIRole (REQUIP) 2 MG tablet Take 1 tablet (2 mg total) by mouth 3 (three) times daily.  360 tablet  0  . torsemide (DEMADEX) 20 MG tablet Take 1 tablet (20 mg total) by mouth daily.  30 tablet  2  . [DISCONTINUED] oxybutynin (DITROPAN) 5 MG tablet Take 1 tablet (5 mg total) by mouth 3 (three) times daily.  90 tablet  3    Objective: BP 122/66  Pulse 60  Temp(Src) 99.5 F (37.5 C) (Oral)  Resp 16  Wt 181 lb 4.8 oz (82.237 kg)  BMI 31.1 kg/m2  SpO2 100% Exam: General: In bed, sitter at bedside. Happy and interactive.  HEENT: MMM. [place] in place Cardiovascular: RRR Respiratory: CTAB Abdomen: Soft nontender Extremities: Moves all extremities. No edema Skin: Wearing diaper, has erythema of groin Neuro: Follows command, no focal deficits Psych: Tangential thoughts, paranoid. Makes appropriate eye contact. No ticks or nervous behavior. Not agitated  Labs and Imaging: CBC BMET   Recent Labs Lab 11/08/12 2230  WBC 8.2  HGB 13.9  HCT 41.7  PLT 277    Recent Labs Lab 11/08/12 2230  NA 143  K 2.5*  CL 102  CO2 28  BUN 13  CREATININE 1.16  GLUCOSE 84  CALCIUM 9.7      Hilarie Fredrickson, MD 11/09/2012, 8:38 AM PGY-3, [facility] FPTS Intern pager: [phone], text pages welcome

## 2012-11-09 NOTE — Progress Notes (Signed)
Patient arrived on stretcher from [facility] ED via [facility] at [phone].  Patient alert, rambling, confused speech in no acute distress.  Respirations even and unlabored.  IV infusing to left arm.  Paper scrubs on.  VSS  99.5  122/66  60  16  100%2L  Wt 181.3lb  Attempted to orient patient to room /unit/ call light.  Bed low and locked.  MD paged and notified of patient's arrival.  Safety sitter arrived at 0700.  Report given to oncoming nurse.

## 2012-11-09 NOTE — ED Notes (Signed)
Patient talking frequently about being saved, repenting, the [employer], IS and other militant groups. Patient frequently asks what time it is. Patient redirected and re-oriented to place and time. Patient states that he was discharged from the hospital 19 days ago and has had diarrhea and frequent urination since that time. States that at home he eats "cookies, crackers and soups" but appetite has been decreased since coming home. States that before he returned home he went to a rehab facility.

## 2012-11-09 NOTE — ED Notes (Signed)
Report given to 5N, [facility] called for transport

## 2012-11-10 LAB — BASIC METABOLIC PANEL
BUN: 8 mg/dL (ref 6–23)
CO2: 27 mEq/L (ref 19–32)
Chloride: 108 mEq/L (ref 96–112)
Creatinine, Ser: 0.97 mg/dL (ref 0.50–1.35)
GFR calc Af Amer: >90 mL/min (ref >90)
GFR calc non Af Amer: 86 mL/min — ABNORMAL LOW (ref >90)
Potassium: 3.6 mEq/L (ref 3.5–5.1)

## 2012-11-10 LAB — CBC
HCT: 40.9 % (ref 39.0–52.0)
Hemoglobin: 13.2 g/dL (ref 13.0–17.0)
MCHC: 32.3 g/dL (ref 30.0–36.0)
MCV: 96 fL (ref 78.0–100.0)
Platelets: 213 10*3/uL (ref 150–400)
RBC: 4.26 MIL/uL (ref 4.22–5.81)
RDW: 16.2 % — ABNORMAL HIGH (ref 11.5–15.5)

## 2012-11-10 LAB — GLUCOSE, CAPILLARY: Glucose-Capillary: 117 mg/dL — ABNORMAL HIGH (ref 70–99)

## 2012-11-10 NOTE — Progress Notes (Signed)
[facility]  Patient Status: Active (receiving services up to time of hospitalization)  [facility] is providing the following services: RN, PT and OT and MSW - this patient was in [facility] from 9/8-9/18/14. Referral to [facility] for PT and OT.  Seen by PT on 9/22. Chaotic home situation, no caregiver. Unsafe home situation Seen x 1 visit by PT before admission. Our staff does not think this pt should live alone.  Please call me if you have any questions about this patient.  Jodene Nam RN  [phone]  If patient discharges after hours, please call [phone].   Jodene Nam 11/10/2012, 3:13 PM

## 2012-11-10 NOTE — Evaluation (Signed)
Occupational Therapy Evaluation Patient Details Name: Patrick Holt MRN: [id number] DOB: Oct 29, 1950 Today's Date: 11/10/2012 Time: 1135-1200 OT Time Calculation (min): 25 min  OT Assessment / Plan / Recommendation History of present illness  62 yo M with very extensive PMH including mutliple prior psychotic breaks, bipolar disorder, HTN, questionable seizure disorder, atrial fibrillation not an anticoagulant candidate, 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 Questionable if patient is managing his medication well at home  and whether he is able to care for himself  at  home.   Clinical Impression   The pt presents with mild ADL and functional mobility deficits that are further complicated by his current mental health issues.  Agree with PT note that unfortunately don't think his is a candidate for SNF.  His cognition-mental health is really what is making it unsafe for him to be at home by himself.  He also reports that since he does not drive he doesn't always have access to food.   Maybe once medically stable and follow-up resources set up, patient could function at home is stays on medication.  OT to follow acutely for deficits listed below.       OT Assessment  Patient needs continued OT Services    Follow Up Recommendations  SNF (vs [facility])    Barriers to Discharge Decreased caregiver support    Equipment Recommendations  None recommended by OT    Frequency  Min 2X/week    Precautions / Restrictions Precautions Precautions: Fall Precaution Comments: mildly unstable on his feet   Pertinent Vitals/Pain Denies pain    ADL  Eating/Feeding: Independent Where Assessed - Eating/Feeding: Chair Grooming: Performed;Wash/dry hands;Brushing hair;Set up Where Assessed - Grooming: Unsupported sitting Upper Body Bathing: Simulated;Minimal assistance Where Assessed - Upper Body Bathing:  Supported sitting Lower Body Bathing: Simulated;Minimal assistance Where Assessed - Lower Body Bathing: Supported standing;Supported sitting Upper Body Dressing: Simulated;Set up Where Assessed - Upper Body Dressing: Supported sitting Lower Body Dressing: Performed;Simulated;Minimal assistance Where Assessed - Lower Body Dressing: Supported standing;Supported sitting Toilet Transfer: Performed;Minimal assistance Toilet Transfer Method: Sit to stand;Stand pivot Acupuncturist: Drop arm bedside commode Toileting - Clothing Manipulation and Hygiene: Performed;Min guard Where Assessed - Toileting Clothing Manipulation and Hygiene: Sit to stand from 3-in-1 or toilet;Sit on 3-in-1 or toilet Transfers/Ambulation Related to ADLs: Unsteady with functional mobility ADL Comments: Poor sustain attention to complete ADL tasks    OT Diagnosis: Generalized weakness;Cognitive deficits  OT Problem List: Decreased strength;Decreased activity tolerance;Impaired balance (sitting and/or standing);Decreased cognition;Decreased safety awareness OT Treatment Interventions: Self-care/ADL training;DME and/or AE instruction;Therapeutic activities;Patient/family education;Balance training;Cognitive remediation/compensation   OT Goals(Current goals can be found in the care plan section)    Visit Information  Last OT Received On: 11/10/12 History of Present Illness:  62 yo M with very extensive PMH including mutliple prior psychotic breaks, bipolar disorder, HTN, questionable seizure disorder, atrial fibrillation not an anticoagulant candidate, 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 Questionable if patient is managing his medication well at home  and whether he is able to care for himself  at  home.       Prior Functioning     Home Living Family/patient expects to be discharged to:: Private  residence Living Arrangements: Alone Available Help at Discharge: Other (Comment) (no support reported) Type of Home: House Home Access: Ramped entrance Home Layout: One  level Home Equipment: Chartered certified accountant - single point;Walker - 2 wheels Additional Comments: per pt report, PTA  he did not walk with cane or walker Prior Function Level of Independence: Independent Comments: per pt report he has a hard time getting food because he doesn't drive anymore, "I take a taxi to [employer]" Communication Communication: No difficulties Dominant Hand: Right    Cognition  Cognition Arousal/Alertness: Awake/alert Behavior During Therapy: WFL for tasks assessed/performed;Impulsive;Anxious Overall Cognitive Status: Impaired/Different from baseline (h/o mental health issues and cognitive issues) Area of Impairment: Safety/judgement;Attention;Memory;Following commands;Awareness Orientation Level: Situation;Time;Disoriented to Current Attention Level: Sustained (max cues due to internal distractions, tangential) Memory: Decreased short-term memory Following Commands: Follows multi-step commands inconsistently Safety/Judgement: Decreased awareness of safety Awareness: Intellectual Problem Solving: Requires verbal cues General Comments: Extremely tangential and unable to recognize multi-modal cues to end our session.  Brought up the topic of stolen wallet and his concerns about its contents as well as relays that he does NOT want to go to a SNF "like before".    Mobility Bed Mobility Bed Mobility: Supine to Sit;Sitting - Scoot to Edge of Bed;Sit to Supine Supine to Sit: 6: Modified independent (Device/Increase time);With rails;HOB elevated Sitting - Scoot to Edge of Bed: 6: Modified independent (Device/Increase time);With rail Scooting to [facility]: 5: Supervision Details for Bed Mobility Assistance: pt relying on railing and HOB elevated to pull to sitting EOB and to scoot EOB.   Transfers Sit to  Stand: With upper extremity assist;From bed;4: Min guard;From chair/3-in-1 Stand to Sit: 4: Min guard;With upper extremity assist;To chair/3-in-1 Details for Transfer Assistance: supervision and occasional steady assist for safety     End of Session OT - End of Session Activity Tolerance: Patient tolerated treatment well Patient left: in chair;with call bell/phone within reach Nurse Communication: Mobility status (NT notified to keep eye on patient)  GO     Wm Fruchter 11/10/2012, 12:47 PM

## 2012-11-10 NOTE — Progress Notes (Signed)
Family Medicine Teaching Service Daily Progress Note Intern Pager: [phone]  Patient name: Patrick Holt Medical record number: [id number] Date of birth: 1950-11-13 Age: 62 y.o. Gender: male  Primary Care Provider: Gaspar Bidding, DO Consultants: Psych Code Status: Full  Pt Overview and Major Events to Date:  9/25: Hypokalemia 2.5; Paranoid thoughts- Psych consulted 9/26: hypokalemia resolved (3.6); medically stable; Psych & SW to aid with Dispo  Assessment and Plan: Patrick Holt is a 62 y.o. male admitted from [facility] ED for medical clearance due to hypokalemia. PMH is significant for extensive psych history including major depressive disorder, anxiety and psychotic disorder.   # Hypokalemia - Patient found to be hypokalemic to 2.5 on Bmet in ED while undergoing evaluation for [facility] admission. He has had this issue in the past. Magnesium wnl. On review of medications, he is on Torsemide at home although not sure if/when he is actually taking this. EKG revealed no changes. S/p IV and Kdur x1 prior to arrival.  - Hold Torsemide; Kdur x3 on admit - Ethanol, Acetaminophen, Salicylate all Neg - UDS neg; TSH wnl; UA: Leu (small), Nit (-), (+) Prot, Ket, Hgb, and Bili - Bmet: K 3.6; Cr 0.97 - EKG 9/26:  NSR, unchanged from admit; no peaked T waves   # Psychosis - Patient is currently paranoid and having tangential thoughts. He states he has not taken his medications because he has "been losing so much weight." Patient is unable to tell me if he has taken any medications lately.  - Restart risperidone scheduled BID (rather than prn) Currently, patient is pleasant and not agitated. Can add Haldol if needed for changes in behavior, but this has not been ordered at admission  - Xanax TID prn for anxiety  - D/c Requip  - Psych for evaluation for [facility] admission once hypokalemia resolves  Recommended to acute psychiatric hospitalization for medication management    Increase Depakote to 750 mg twice daily   Increase risperidone 1 mg twice daily   Patient may benefit skilled nursing facility where medication management    # Elevated CK - Patient discharged from hospital on 9/1 for elevated CK. At that time it was trending down, and currently in the 600's  - IVF at 100cc/hr  - Troponin negative  - No further work up   # Dermatitis - Patient has dermatitis of his groin, likely secondary to incontinence  - Barrier cream prn for irritation   # History of A.fib- Sinus rhythm now.  - Continue metoprolol and amiodarone   # Tobacco abuse - Chart states he has been a tobacco user, I was unable to get a straightforward answer  - Consider adding nicotine patch if becomes agitated   # Seizure disorder - Known history of seizures. Denies any recent activity  - Depakote level subtheraputic (<10)  - Continue Depakote and Keppra  - Head CT wnl, no further imaging needed   # Rheumatoid Arthritis - On Methotrexate three times per week, I assume for RA.  - Continue methotrexate MWF   # Social concerns - It seems patient is not able to care for himself well at home. He was discharged to SNF at last hospitalization. Patient may need to be admitted to behavioral health, but will see how patient does with restarting medications. Psych Recommended to acute psychiatric hospitalization for medication management  - PT/OT  - SW consulted to aid with dispo  FEN/GI: D51/2NS + KCL at 100 cc/hr  Prophylaxis:  Lovenox, but if unable to tolerate injection can place SCD   Disposition: Pending hospital course to determine [facility] vs. SNF. Patient most likely benefit from [facility].  Subjective: No complaints this morning, denies CP, weakness or palpations.   Objective: Temp:  [98.4 F (36.9 C)-98.9 F (37.2 C)] 98.4 F (36.9 C) (09/26 0555) Pulse Rate:  [57-86] 57 (09/26 0555) Resp:  [18-20] 18 (09/26 0555) BP: (117-131)/(59-77) 117/59 mmHg (09/26 0555) SpO2:  [94  %-100 %] 99 % (09/26 0555) Physical Exam: General: In bed, sitter at bedside. Happy and interactive.  HEENT: MMM. Cardiovascular: RRR  Respiratory: CTAB  Abdomen: Soft nontender  Extremities: Moves all extremities. No edema  Neuro: Follows command, no focal deficits  Psych: Tangential thoughts and circumferential speech Makes appropriate eye contact. No ticks or nervous behavior. Not agitated  Laboratory: Results for orders placed during the hospital encounter of 11/08/12 (from the past 24 hour(s))  BASIC METABOLIC PANEL     Status: Abnormal   Collection Time    11/09/12 10:35 AM      Result Value Range   Sodium 143  135 - 145 mEq/L   Potassium 2.8 (*) 3.5 - 5.1 mEq/L   Chloride 108  96 - 112 mEq/L   CO2 26  19 - 32 mEq/L   Glucose, Bld 122 (*) 70 - 99 mg/dL   BUN 11  6 - 23 mg/dL   Creatinine, Ser 1.61  0.50 - 1.35 mg/dL   Calcium 8.9  8.4 - 09.6 mg/dL   GFR calc non Af Amer 74 (*) >90 mL/min   GFR calc Af Amer 85 (*) >90 mL/min  TSH     Status: None   Collection Time    11/09/12 10:35 AM      Result Value Range   TSH 0.708  0.350 - 4.500 uIU/mL  VALPROIC ACID LEVEL     Status: Abnormal   Collection Time    11/09/12 10:35 AM      Result Value Range   Valproic Acid Lvl <10.0 (*) 50.0 - 100.0 ug/mL  GLUCOSE, CAPILLARY     Status: None   Collection Time    11/09/12 10:53 PM      Result Value Range   Glucose-Capillary 94  70 - 99 mg/dL  GLUCOSE, CAPILLARY     Status: Abnormal   Collection Time    11/10/12  6:20 AM      Result Value Range   Glucose-Capillary 117 (*) 70 - 99 mg/dL   Imaging/Diagnostic Tests: CT Head IMPRESSION:  1. No evidence of acute intracranial disease.  2. Age advanced chronic small vessel disease.   Wenda Low, MD 11/10/2012, 7:13 AM PGY-1, [facility] Family Medicine FPTS Intern pager: [phone], text pages welcome

## 2012-11-10 NOTE — Progress Notes (Signed)
Attending Addendum  I examined the patient and discussed the assessment and plan with Dr. Gayla Doss. I have reviewed the note and agree.  Patient medically stable for discharge to in patient psych when a bed becomes available. He remains calm, alert and delusional.     Patrick Phi, MD FAMILY MEDICINE TEACHING SERVICE

## 2012-11-10 NOTE — Progress Notes (Signed)
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 Questionable if patient is managing his medication well at home  and whether he is able to care for himself  at  home.   PT Comments   Patient slow to process activities for balance, but completes with cues.  Demonstrates high fall risk in community situations with Dynamic Gait Index score 17/24 (less than 19 predictive of falls in older community dwelling adults.)  Patient continues to be unsafe for independent living at this time due to poor insight, decreased memory and decreased awareness.    Follow Up Recommendations  Home health PT;Supervision/Assistance - 24 hour (versus inpt psychiatric hospital)     Does the patient have the potential to tolerate intense rehabilitation   N/A  Barriers to Discharge   Decreased caregiver support       Equipment Recommendations  None recommended by PT    Recommendations for Other Services  None  Frequency Min 3X/week   Progress towards PT Goals Progress towards PT goals: Progressing toward goals  Plan Current plan remains appropriate;Discharge plan needs to be updated    Precautions / Restrictions Precautions Precautions: Fall Precaution Comments: mildly unstable on his feet   Pertinent Vitals/Pain C/o back pain with standing and walking about 10 minutes Relieved when seated    Mobility  Bed Mobility Bed Mobility: Supine to Sit;Sitting - Scoot to Edge of Bed;Sit to Supine Supine to Sit:  6: Modified independent (Device/Increase time);With rails;HOB elevated Sitting - Scoot to Edge of Bed: 6: Modified independent (Device/Increase time);With rail Scooting to [facility]: 5: Supervision Details for Bed Mobility Assistance: pt relying on railing and HOB elevated to pull to sitting EOB and to scoot EOB.   Transfers Sit to Stand: 5: Supervision;From chair/3-in-1 Stand to Sit: 5: Supervision;To chair/3-in-1;4: Min guard Details for Transfer Assistance: supervision and occasional steady assist for safety when forgets to manage his IV line Ambulation/Gait Ambulation/Gait Assistance: 5: Supervision Ambulation Distance (Feet): 250 Feet Assistive device: None Ambulation/Gait Assistance Details: flexed gait with occasional balance loss during challenges with head turns (to left) Gait Pattern: Step-through pattern;Decreased stride length;Trunk flexed;Wide base of support Stairs: Yes Stairs Assistance: 5: Supervision Stairs Assistance Details (indicate cue type and reason): for safety due to IV pole Stair Management Technique: Forwards;Two rails;Alternating pattern Number of Stairs: 5      PT Goals (current goals can now be found in the care plan section)    Visit Information  Last PT Received On: 11/10/12 History of Present Illness:  62 yo M with very extensive PMH including mutliple prior psychotic breaks, bipolar disorder, HTN, questionable seizure disorder, atrial fibrillation not an anticoagulant candidate, 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 Questionable if patient is managing his medication well at home  and whether he is able to care for himself  at  home.    Subjective Data   I just want to find out from social worker what is going on.   Cognition  Cognition Arousal/Alertness: Awake/alert Behavior During Therapy:  WFL for tasks assessed/performed;Impulsive;Anxious Area of  Impairment: Safety/judgement;Attention;Memory Current Attention Level: Sustained Memory: Decreased short-term memory Safety/Judgement: Decreased awareness of safety Problem Solving: Requires verbal cues    Balance  Standardized Balance Assessment Standardized Balance Assessment: Dynamic Gait Index Dynamic Gait Index Level Surface: Mild Impairment Change in Gait Speed: Mild Impairment Gait with Horizontal Head Turns: Moderate Impairment Gait with Vertical Head Turns: Normal Gait and Pivot Turn: Mild Impairment Step Over Obstacle: Mild Impairment Step Around Obstacles: Normal Steps: Mild Impairment Total Score: 17 High Level Balance High Level Balance Activites: Other (comment) High Level Balance Comments: Alternating step taps to 6" step without UE support  End of Session PT - End of Session Equipment Utilized During Treatment: Gait belt Activity Tolerance: Patient limited by pain Patient left: in chair;with call bell/phone within reach   GP     [facility] 11/10/2012, 4:31 PM [place], [place] [phone] 11/10/2012

## 2012-11-11 MED ORDER — INFLUENZA VAC SPLIT QUAD 0.5 ML IM SUSP
0.5000 mL | INTRAMUSCULAR | Status: AC
  Filled 2012-11-11: qty 0.5

## 2012-11-11 NOTE — Discharge Summary (Signed)
Family Medicine Teaching [facility] Discharge Summary  Patient name: Patrick Holt Medical record number: [id number] Date of birth: [birth year] Age: 62 y.o. Gender: male Date of Admission: 11/08/2012  Date of Discharge: 11/16/2012 Admitting Physician: Nestor Ramp, MD  Primary Care Provider: Gaspar Bidding, DO Consultants: Psychiatry, CSW  Indication for Hospitalization: Hypokalemia and Psychosis   Discharge Diagnoses/Problem List:  Bipolar disorder Medical non-compliance Hypokalemia Dermatomyositis Dermatitis Seizure disorder Urge incontinence  Disposition: Home with home health PT, ACT team, and home evaluation for PASARR to be voluntarily placed in an assisted living facility.   Discharge Condition: Stable  Discharge Exam:  Temp: [98 F (36.7 C)-98.3 F (36.8 C)] 98 F (36.7 C) (10/02 0459)  Pulse Rate: [61-74] 74 (10/02 0459)  Resp: [18] 18 (10/02 0459)  BP: (128-138)/(64-80) 128/66 mmHg (10/02 0459)  SpO2: [95 %-100 %] 95 % (10/02 0459)  Physical Exam:  General: In chair, conversant, cooperative, non ill appearing  HEENT: MMM  Cardiovascular: RRR, no murmur  Respiratory: CTAB  Extremities: Moves all extremities. No edema  Neuro: Follows command, no focal deficits  Psych: Tangential thoughts, speech non-pressured at normal volume, insight and judgment normal, no SI HI. Unclear the veracity of his perseverations on financial issues.   Brief Hospital Course: Patrick Holt is a 62 y.o. male admitted from [facility] for medical clearance due to hypokalemia and was found to be acute paranoid and exhibiting tangential speech. PMH is significant for major depressive disorder, anxiety and psychotic disorder. He stated he has not taken his medications because he has "been losing so much weight," and perseverated throughout admission on needing to speak with his bank about a stolen debit card and to go home to see what has been stolen. His psychiatric status improved  mildly through admission after restarting medications. Psychiatric consultation deemed him without capacity for medical decision making on 9/30 and recommended that he be ward of the state and kept in an assisted living facility on a locked unit. On repeat examination on 10/1 psychiatric consultant stated that he could make his own decisions about medical care and living arrangements. Pt is discharged on 11/16/2012 to home with plans to be evaluated at home for PASARR and to be seen by the ACT team. ALF placement is being arranged as outpatient by LCSW at [facility]. Pt voluntarily willing to sign in to ALF on discharge, though states he needs to settle some issues with his bank and move his furniture into storage.   On review of medications, he was on Torsemide at home although not sure if/when he is actually taking this. ECG at admission and 10/1 revealed no changes from prior. Potassium was repleted and his medical conditions were stable throughout hospitalization.   Issues for Follow Up:  Pt would benefit from organized living situation as medical non-compliance precipitates multiple ED visits/admissions. Facilitation of this is primary goal at this time.  Pt agreeable to assisted living facility.  Needs PASARR for ALF placement.   Significant Procedures: None  Significant Labs and Imaging:   Recent Labs Lab 11/10/12 0740 11/15/12 0715  WBC 5.5 6.2  HGB 13.2 13.3  HCT 40.9 39.4  PLT 213 215    Recent Labs Lab 11/10/12 0740 11/12/12 1145  NA 142 143  K 3.6 4.2  CL 108 107  CO2 27 29  GLUCOSE 98 79  BUN 8 9  CREATININE 0.97 0.90  CALCIUM 8.5 8.7    CT Head IMPRESSION:  1. No  evidence of acute intracranial disease.  2. Age advanced chronic small vessel disease.  Results/Tests Pending at Time of Discharge: None  Discharge Medications:   Medication List         ALPRAZolam 1 MG tablet  Commonly known as:  XANAX  Take 1 tablet (1 mg total) by mouth 3  (three) times daily as needed for sleep or anxiety.     amiodarone 200 MG tablet  Commonly known as:  PACERONE  Take 1 tablet (200 mg total) by mouth daily before breakfast.     aspirin 81 MG EC tablet  Take 1 tablet (81 mg total) by mouth daily.     collagenase ointment  Commonly known as:  SANTYL  Apply topically 3 (three) times daily as needed (for wound care).     divalproex 500 MG 24 hr tablet  Commonly known as:  DEPAKOTE ER  Take 1 tablet (500 mg total) by mouth 2 (two) times daily with a meal.     divalproex 250 MG DR tablet  Commonly known as:  DEPAKOTE  Take 3 tablets (750 mg total) by mouth 2 (two) times daily with a meal.     esomeprazole 40 MG capsule  Commonly known as:  NEXIUM  Take 40 mg by mouth daily before breakfast.     Ipratropium-Albuterol 20-100 MCG/ACT Aers respimat  Commonly known as:  COMBIVENT  Inhale 1 puff into the lungs every 6 (six) hours.     levETIRAcetam 500 MG tablet  Commonly known as:  KEPPRA  Take 1 tablet (500 mg total) by mouth 2 (two) times daily.     methotrexate 2.5 MG tablet  Commonly known as:  RHEUMATREX  Take 2.5 mg by mouth 3 (three) times a week. Caution:Chemotherapy. Protect from light.     metoprolol succinate 25 MG 24 hr tablet  Commonly known as:  TOPROL-XL  Take 12.5 mg by mouth daily.     nitroGLYCERIN 0.4 MG SL tablet  Commonly known as:  NITROSTAT  Place 0.4 mg under the tongue every 5 (five) minutes as needed. For chest pain     oxybutynin 5 MG tablet  Commonly known as:  DITROPAN  Take 1 tablet (5 mg total) by mouth 3 (three) times daily.     risperiDONE 1 MG disintegrating tablet  Commonly known as:  RISPERDAL M-TABS  Take 1 tablet (1 mg total) by mouth 2 (two) times daily.     rOPINIRole 2 MG tablet  Commonly known as:  REQUIP  Take 1 tablet (2 mg total) by mouth 3 (three) times daily.     torsemide 20 MG tablet  Commonly known as:  DEMADEX  Take 1 tablet (20 mg total) by mouth daily.        Discharge Instructions: Please refer to Patient Instructions section of EMR for full details.  Patient was counseled important signs and symptoms that should prompt return to medical care, changes in medications, dietary instructions, activity restrictions, and follow up appointments.  Follow-Up Appointments: Follow-up Information   Follow up with Hazeline Junker, MD On 11/20/2012. (Monday at 3:30pm)    Specialty:  Family Medicine   Contact information:   [address] [place] [address] [phone]      Fulton Reek. Jarvis Newcomer, MD, PGY-1 11/16/2012 12:23 PM PGY-1, [facility] Family Medicine

## 2012-11-11 NOTE — Progress Notes (Signed)
Family Medicine Teaching Service Daily Progress Note Intern Pager: [phone]  Patient name: Patrick Holt Medical record number: [id number] Date of birth: Nov 23, 1950 Age: 62 y.o. Gender: male  Primary Care Provider: Gaspar Bidding, DO Consultants: Psych Code Status: Full  Pt Overview and Major Events to Date:  9/25: Hypokalemia 2.5; Paranoid thoughts- Psych consulted 9/26: hypokalemia resolved (3.6); medically stable; Psych & SW to aid with Dispo  Assessment and Plan: Patrick Holt is a 62 y.o. male admitted from [facility] ED for medical clearance due to hypokalemia. PMH is significant for extensive psych history including major depressive disorder, anxiety and psychotic disorder.   # Hypokalemia - Resolved, Patient found to be hypokalemic to 2.5 on Bmet in ED while undergoing evaluation for [facility] admission. He has had this issue in the past. Magnesium wnl. On review of medications, he is on Torsemide at home although not sure if/when he is actually taking this. EKG revealed no changes. S/p IV and Kdur x1 prior to arrival.  - Hold Torsemide; Kdur x3 on admit - Ethanol, Acetaminophen, Salicylate all Neg - UDS neg; TSH wnl; UA: Leu (small), Nit (-), (+) Prot, Ket, Hgb, and Bili - Bmet: K 3.6; Cr 0.97 - EKG 9/26:  NSR, unchanged from admit; no peaked T waves   # Psychosis - Patient is currently paranoid and having tangential thoughts. He states he has not taken his medications because he has "been losing so much weight." Patient is unable to tell me if he has taken any medications lately.  - Restart risperidone scheduled BID (rather than prn) Currently, patient is pleasant and not agitated. Can add Haldol if needed for changes in behavior, but this has not been ordered at admission  - Xanax TID prn for anxiety  - D/c Requip  - Home health has left note stating they feel he is unsafe to live alone, Appreciate their input.  - Psych for evaluation for [facility] admission once  hypokalemia resolves  Recommended to acute psychiatric hospitalization for medication management   Increase Depakote to 750 mg twice daily   Increase risperidone 1 mg twice daily   Patient may benefit skilled nursing facility where medication management    # Elevated CK - Patient discharged from hospital on 9/1 for elevated CK. At that time it was trending down, and currently in the 600's  - DC IVFs - Troponin negative  - No further work up   # Dermatitis - Patient has dermatitis of his groin, likely secondary to incontinence  - Barrier cream prn for irritation   # History of A.fib- Sinus rhythm now.  - Continue metoprolol and amiodarone   # Tobacco abuse - Chart states he has been a tobacco user, I was unable to get a straightforward answer  - Consider adding nicotine patch if becomes agitated   # Seizure disorder - Known history of seizures. Denies any recent activity  - Depakote level subtheraputic (<10)  - Continue Depakote and Keppra  - Head CT wnl, no further imaging needed   # Rheumatoid Arthritis - On Methotrexate three times per week.  - Continue methotrexate MWF   # Social concerns - It seems patient is not able to care for himself well at home. He was discharged to SNF at last hospitalization. Patient may need to be admitted to behavioral health, but will see how patient does with restarting medications. Psych Recommended to acute psychiatric hospitalization for medication management  - HH feels he is not safe  to live alone - PT/OT  - SW consulted to aid with dispo  FEN/GI: Saline lock IV Prophylaxis: Lovenox, but if unable to tolerate injection can place SCD   Disposition: Medically stable and clear for DC to IP psych, awaiting placement  Subjective: No complaints this morning except that he has a few things to do at home before going to psych IP.   Objective: Temp:  [98.6 F (37 C)-98.9 F (37.2 C)] 98.6 F (37 C) (09/26 2210) Pulse Rate:  [55-62] 55  (09/26 2210) Resp:  [18-20] 18 (09/26 2210) BP: (126-145)/(71-75) 145/75 mmHg (09/26 2210) SpO2:  [95 %-100 %] 95 % (09/27 0220) Physical Exam: General: In bed, Happy and interactive.  HEENT: MMM Cardiovascular: RRR, no murmur Respiratory: CTAB  Abdomen: Soft nontender  Extremities: Moves all extremities. No edema  Neuro: Follows command, no focal deficits  Psych: Tangential thoughts and circumferential speech Makes appropriate eye contact. No ticks or nervous behavior. Not agitated  Laboratory: No results found for this or any previous visit (from the past 24 hour(s)). Imaging/Diagnostic Tests: CT Head IMPRESSION:  1. No evidence of acute intracranial disease.  2. Age advanced chronic small vessel disease.   Elenora Gamma, MD 11/11/2012, 9:45 AM PGY-2, [facility] FPTS Intern pager: [phone], text pages welcome

## 2012-11-11 NOTE — Progress Notes (Signed)
Attending Addendum  I examined the patient and discussed the assessment and plan with Dr. Ermalinda Memos. I have reviewed the note and agree.     Patient medically stable for discharge to in patient psych. Not able to be discharged to home due to persistent delusions. Awaiting placement. Case management consulted to assist with home management concerns (bills, rent, fraud). Difficult to discern which concerns are valid and which are delusions.   Dessa Phi, MD FAMILY MEDICINE TEACHING SERVICE

## 2012-11-12 DIAGNOSIS — R4182 Altered mental status, unspecified: Secondary | ICD-10-CM

## 2012-11-12 LAB — BASIC METABOLIC PANEL
CO2: 29 mEq/L (ref 19–32)
Calcium: 8.7 mg/dL (ref 8.4–10.5)
Chloride: 107 mEq/L (ref 96–112)
GFR calc Af Amer: >90 mL/min (ref >90)
Glucose, Bld: 79 mg/dL (ref 70–99)
Sodium: 143 mEq/L (ref 135–145)

## 2012-11-12 MED ORDER — ACETAMINOPHEN 325 MG PO TABS
650.0000 mg | ORAL_TABLET | Freq: Four times a day (QID) | ORAL | Status: DC | PRN
  Administered 2012-11-12 – 2012-11-15 (×4): 650 mg via ORAL
  Filled 2012-11-12 (×4): qty 2

## 2012-11-12 MED ORDER — WHITE PETROLATUM GEL
Status: AC
  Administered 2012-11-12: 0.2
  Filled 2012-11-12: qty 5

## 2012-11-12 MED ORDER — PNEUMOCOCCAL VAC POLYVALENT 25 MCG/0.5ML IJ INJ
0.5000 mL | INJECTION | INTRAMUSCULAR | Status: AC
  Administered 2012-11-13: 0.5 mL via INTRAMUSCULAR
  Filled 2012-11-12: qty 0.5

## 2012-11-12 NOTE — Progress Notes (Signed)
Family Medicine Teaching Service Daily Progress Note Intern Pager: [phone]  Patient name: Patrick Holt Medical record number: [id number] Date of birth: 09-17-1950 Age: 62 y.o. Gender: male  Primary Care Provider: Gaspar Bidding, DO Consultants: Psych Code Status: Full  Pt Overview and Major Events to Date:  9/25: Hypokalemia 2.5; Paranoid thoughts- Psych consulted 9/26: hypokalemia resolved (3.6); medically stable; Psych & SW to aid with Dispo 9/27: Medically stable appropriate for transition to behavioral health, no beds available 9/28: Medically stable appropriate for transition to behavioral health, no beds available  Assessment and Plan: Patrick Holt is a 62 y.o. male admitted from [facility] ED for medical clearance due to hypokalemia. PMH is significant for extensive psych history including major depressive disorder, anxiety and psychotic disorder.   # Hypokalemia - Resolved, Patient found to be hypokalemic to 2.5 on Bmet in ED while undergoing evaluation for [facility] admission. He has had this issue in the past. Magnesium wnl. On review of medications, he is on Torsemide at home although not sure if/when he is actually taking this. EKG revealed no changes. S/p IV and Kdur x1 prior to arrival.  - Resolved, patient medically stable for admission to inpatient behavioral health  # Psychosis - Patient is currently paranoid and having tangential thoughts. He states he has not taken his medications because he has "been losing so much weight." Patient is unable to tell me if he has taken any medications lately.  - Restart risperidone scheduled BID (rather than prn) Currently, patient is pleasant and not agitated. Can add Haldol if needed for changes in behavior, but this has not been ordered at admission  - Xanax TID prn for anxiety  - D/c Requip  - Home health has left note stating they feel he is unsafe to live alone, Appreciate their input.  - Psych for evaluation for  [facility] admission once hypokalemia resolves  Recommended to acute psychiatric hospitalization for medication management   Increase Depakote to 750 mg twice daily   Increase risperidone 1 mg twice daily   Patient may benefit skilled nursing facility where medication management    # Elevated CK - Patient discharged from hospital on 9/1 for elevated CK. At that time it was trending down, and currently in the 600's  - No further work up   # Dermatitis - Patient has dermatitis of his groin, likely secondary to incontinence  - Barrier cream prn for irritation   # History of A.fib- Sinus rhythm now.  - Continue metoprolol and amiodarone   # Tobacco abuse - Chart states he has been a tobacco user, I was unable to get a straightforward answer  - Consider adding nicotine patch if becomes agitated   # Seizure disorder - Known history of seizures. Denies any recent activity  - Depakote level subtheraputic (<10)  - Continue Depakote and Keppra  - Head CT wnl, no further imaging needed   # Rheumatoid Arthritis - On Methotrexate three times per week.  - Continue methotrexate MWF   # Social concerns - It seems patient is not able to care for himself well at home. He was discharged to SNF at last hospitalization. Patient may need to be admitted to behavioral health, but will see how patient does with restarting medications. Psych Recommended to acute psychiatric hospitalization for medication management  - HH feels he is not safe to live alone - PT/OT  - SW consulted to aid with dispo  FEN/GI: Saline lock IV Prophylaxis: Lovenox, but  if unable to tolerate injection can place SCD   Disposition: Medically stable and clear for DC to IP psych, awaiting placement  Subjective: Patient concerned about going to the bank prior to going to inpatient psychiatry; also complains of lower back pain that is chronic in nature and worsened by the hospital bed   Objective: Temp:  [97.9 F (36.6 C)] 97.9 F  (36.6 C) (09/28 0549) Pulse Rate:  [57-59] 57 (09/28 0549) Resp:  [18] 18 (09/28 0549) BP: (134-138)/(70-77) 134/70 mmHg (09/28 0549) SpO2:  [96 %-100 %] 100 % (09/28 0549) Physical Exam: General: In bed, conversant, non ill appearing   HEENT: MMM Cardiovascular: RRR, no murmur Respiratory: CTAB  Abdomen: Soft nontender  Extremities: Moves all extremities. No edema  Neuro: Follows command, no focal deficits  Psych: Tangential thoughts and circumferential speech Makes appropriate eye,  contact. No ticks or nervous behavior. Not agitated No evidence of auditory hallucinations   Laboratory:  Recent Labs Lab 11/08/12 2230 11/10/12 0740  WBC 8.2 5.5  HGB 13.9 13.2  HCT 41.7 40.9  PLT 277 213   BMET/CMET  Recent Labs Lab 11/08/12 2230 11/09/12 1035 11/10/12 0740 11/12/12 1145  NA 143 143 142 143  K 2.5* 2.8* 3.6 4.2  CL 102 108 108 107  CO2 28 26 27 29   BUN 13 11 8 9   CREATININE 1.16 1.05 0.97 0.90  CALCIUM 9.7 8.9 8.5 8.7  PROT 6.9  --   --   --   BILITOT 0.8  --   --   --   ALKPHOS 80  --   --   --   ALT 25  --   --   --   AST 31  --   --   --   GLUCOSE 84 122* 98 79     Imaging/Diagnostic Tests: CT Head IMPRESSION:  1. No evidence of acute intracranial disease.  2. Age advanced chronic small vessel disease.   Garnetta Buddy, MD 11/12/2012, 2:22 PM PGY-3, [facility] Family Medicine FPTS Intern pager: [phone], text pages welcome

## 2012-11-12 NOTE — Progress Notes (Signed)
Pt asking for flu and PNA vaccines.Stated  Has  had both in past with no adverse reaction. Due to multi allergies,Dr Michail Jewels will clarify order  Before RN administers these vaccines.Will report to RN at shift change in the am.Alle Difabio, Doran Durand

## 2012-11-12 NOTE — BH Assessment (Signed)
[facility] Assessment Progress Note     Patient has been medically cleared. At this time, the patient is being referred to the following Gero-behavioral units: [place], [facility], [facility], [place]. [place] and [place]. Information was faxed to the respective facilities; awaiting return calls on whether patient has been accepted at this time.    Shon Baton, MSW, LCSW, LCASA, CSW-G

## 2012-11-12 NOTE — Progress Notes (Addendum)
Clinical Child psychotherapist (CSW) called Berne[facility] Heinrich house coverage at [facility] to inquire about a bed for patient. Inetta Fermo reported that there are no beds available at this time and they can reevaluate patient chart tomorrow.   Jetta Lout, LCSWA Weekend CSW [phone]  CSW faxed referrals for inpatient psych to [place], [facility] and attempted to send to [facility] twice but they never received the fax.  Jetta Lout, LCSWA Weekend CSW [phone]

## 2012-11-12 NOTE — Progress Notes (Signed)
Attending Addendum  I examined the patient and discussed the assessment and plan with Dr. Clinton Sawyer. I have reviewed the note and agree.  Patient continues to have delusions and tangential thinking. Potassium is normal. Medically stable for inpatient psych when a bed becomes available.     Dessa Phi, MD FAMILY MEDICINE TEACHING SERVICE

## 2012-11-13 DIAGNOSIS — E876 Hypokalemia: Principal | ICD-10-CM

## 2012-11-13 MED ORDER — INFLUENZA VAC SPLIT QUAD 0.5 ML IM SUSP
0.5000 mL | INTRAMUSCULAR | Status: AC
  Administered 2012-11-13: 0.5 mL via INTRAMUSCULAR

## 2012-11-13 NOTE — Clinical Social Work Psych Note (Signed)
Psych CSW reviewed chart and pt disposition with both unit CSW and MD.  Pt has been recommended by Psych MD, for inpatient psych - medication stabilization.  Pt would be geri-psych admission.  Pt has been referred to: - [facility] - denied (no reason given) - [facility] - denied per Beth due to medical acuity - [facility] - no geri-psych  11/13/2012: [facility] [place]   Psych CSW will continue to seek placement.  MD has requested Psych MD to re-evaluate.  Vickii Penna, LCSWA [phone]  Clinical Social Work
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
## 2012-11-13 NOTE — Progress Notes (Signed)
Family Medicine Teaching Service Daily Progress Note Intern Pager: [phone]  Patient name: Patrick Holt Medical record number: [id number] Date of birth: [birth year] Age: 62 y.o. Gender: male  Primary Care Provider: Gaspar Bidding, DO Consultants: Psych Code Status: Full  Pt Overview and Major Events to Date:  9/25: Hypokalemia 2.5; Paranoid thoughts- Psych consulted 9/26: hypokalemia resolved (3.6); medically stable; Psych & SW to aid with Dispo 9/27: Medically stable appropriate for transition to behavioral health, no beds available 9/28: Medically stable appropriate for transition to behavioral health, no beds available  Assessment and Plan: Patrick Holt is a 62 y.o. male admitted from [facility] ED for medical clearance due to hypokalemia. PMH is significant for extensive psych history including major depressive disorder, anxiety and psychotic disorder.   # Hypokalemia - Resolved, Patient found to be hypokalemic to 2.5 on Bmet in ED while undergoing evaluation for [facility] admission. He has had this issue in the past. Magnesium wnl. On review of medications, he is on Torsemide at home although not sure if/when he is actually taking this. EKG revealed no changes. S/p IV and Kdur x1 prior to arrival.  - Resolved, patient medically stable for admission to inpatient behavioral health  # Psychosis - Patient is currently paranoid and having tangential thoughts. He states he has not taken his medications because he has "been losing so much weight." Patient is unable to tell me if he has taken any medications lately.  - Restart risperidone scheduled BID (rather than prn) Currently, patient is pleasant and not agitated. Can add Haldol if needed for changes in behavior, but this has not been ordered at admission  - Xanax TID prn for anxiety  - D/c Requip  - Home health has left note stating they feel he is unsafe to live alone, Appreciate their input.  - Psych for evaluation for  [facility] admission once hypokalemia resolves  Recommended to acute psychiatric hospitalization for medication management   Increase Depakote to 750 mg twice daily   Increase risperidone 1 mg twice daily   Patient may benefit skilled nursing facility where medication management    # Elevated CK - Patient discharged from hospital on 9/1 for elevated CK. At that time it was trending down, and currently in the 600's  - No further work up   # Dermatitis - Patient has dermatitis of his groin, likely secondary to incontinence  - Barrier cream prn for irritation   # History of A.fib- Sinus rhythm now.  - Continue metoprolol and amiodarone   # Tobacco abuse - Chart states he has been a tobacco user, I was unable to get a straightforward answer  - Consider adding nicotine patch if becomes agitated   # Seizure disorder - Known history of seizures. Denies any recent activity  - Depakote level subtheraputic (<10)  - Continue Depakote and Keppra  - Head CT wnl, no further imaging needed   # Rheumatoid Arthritis - On Methotrexate three times per week.  - Continue methotrexate MWF   # Social concerns - It seems patient is not able to care for himself well at home. He was discharged to SNF at last hospitalization. Patient may need to be admitted to behavioral health, but will see how patient does with restarting medications. Psych Recommended to acute psychiatric hospitalization for medication management  - HH feels he is not safe to live alone - PT/OT following  - SW consulted to aid with dispo  FEN/GI: Saline lock IV Prophylaxis: Lovenox,  but if unable to tolerate injection can place SCD   Disposition: Medically stable and clear for DC to IP psych, awaiting placement  Subjective: Patient concerned about going to the bank prior to going to inpatient psychiatry.  Objective: Temp:  [98.2 F (36.8 C)-98.6 F (37 C)] 98.2 F (36.8 C) (09/29 0626) Pulse Rate:  [59-70] 70 (09/29 0626) Resp:  [18]  18 (09/29 0626) BP: (123-129)/(61-74) 124/61 mmHg (09/29 0626) SpO2:  [95 %-99 %] 99 % (09/29 0626)  Physical Exam: General: In bed, conversant, non ill appearing   HEENT: MMM Cardiovascular: RRR, no murmur Respiratory: CTAB  Abdomen: Soft nontender  Extremities: Moves all extremities. No edema  Neuro: Follows command, no focal deficits  Psych: Tangential thoughts and circumferential speech Makes appropriate eye,  contact. No ticks or nervous behavior. Not agitated No evidence of auditory hallucinations   Laboratory:  Recent Labs Lab 11/08/12 2230 11/10/12 0740  WBC 8.2 5.5  HGB 13.9 13.2  HCT 41.7 40.9  PLT 277 213   BMET/CMET  Recent Labs Lab 11/08/12 2230 11/09/12 1035 11/10/12 0740 11/12/12 1145  NA 143 143 142 143  K 2.5* 2.8* 3.6 4.2  CL 102 108 108 107  CO2 28 26 27 29   BUN 13 11 8 9   CREATININE 1.16 1.05 0.97 0.90  CALCIUM 9.7 8.9 8.5 8.7  PROT 6.9  --   --   --   BILITOT 0.8  --   --   --   ALKPHOS 80  --   --   --   ALT 25  --   --   --   AST 31  --   --   --   GLUCOSE 84 122* 98 79   Imaging/Diagnostic Tests: CT Head IMPRESSION:  1. No evidence of acute intracranial disease.  2. Age advanced chronic small vessel disease.   Wenda Low, MD 11/13/2012, 6:45 AM PGY-1, [facility] Family Medicine FPTS Intern pager: [phone], text pages welcome
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
## 2012-11-14 DIAGNOSIS — F29 Unspecified psychosis not due to a substance or known physiological condition: Secondary | ICD-10-CM

## 2012-11-14 DIAGNOSIS — Z8669 Personal history of other diseases of the nervous system and sense organs: Secondary | ICD-10-CM

## 2012-11-14 DIAGNOSIS — Z9119 Patient's noncompliance with other medical treatment and regimen: Secondary | ICD-10-CM

## 2012-11-14 NOTE — Progress Notes (Signed)
FMTS Attending Admission Note: Patrick Mallet,MD I  have seen and examined this patient, reviewed their chart. I have discussed this patient with the resident. I agree with the resident's findings, assessment and care plan.  
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
## 2012-11-14 NOTE — Progress Notes (Signed)
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 Questionable if patient is managing his medication well at home  and whether he is able to care for himself  at  home.   OT comments  Patient easier to redirect during sponge bath at sink yet continues to be internally and externally distracted.  Patient mildly unsteady on his feet during BADL tasks and ambulating in his room yet did not require physical assist at any time just supervision for safety and set up.   Follow Up Recommendations  SNF (vs. [facility])    Equipment Recommendations  None recommended by OT    Frequency Min 2X/week   Progress towards OT Goals Progress towards OT goals: Progressing toward goals  Plan Discharge plan remains appropriate    Precautions / Restrictions Precautions Precautions: Fall Precaution Comments: mildly unstable on his feet   Pertinent Vitals/Pain Denies pain    ADL  Eating/Feeding: Independent Where Assessed - Eating/Feeding: Chair Grooming: Performed;Wash/dry hands;Brushing hair;Set up;Wash/dry face Where Assessed - Grooming: Unsupported sitting;Unsupported standing Upper Body Bathing: Set up;Performed Where Assessed - Upper Body Bathing: Unsupported sitting Lower Body Bathing: Performed;Supervision/safety;Set up Where Assessed - Lower Body Bathing: Supported standing;Unsupported sitting Upper Body Dressing: Performed;Minimal assistance (hospital gown) Where Assessed - Upper Body  Dressing: Supported standing Transfers/Ambulation Related to ADLs: occasionall unsteady on his feet with functional mobility and ambulating around in his room yet did not require physical assist during BADL tasks ADL Comments: Patient continues with poor sustained attention in quiet environment due to internal and external distractions during simple sponge bath at sink yet much easier to redirect today.    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 Questionable if patient is managing his medication well at home  and whether he is able to care for himself  at  home.    Cognition  Cognition Arousal/Alertness: Awake/alert Behavior During Therapy: WFL for tasks assessed/performed Overall Cognitive Status: History of cognitive impairments - at baseline (h/o mental health issues and cognitive issues) Area of Impairment: Safety/judgement;Attention;Awareness Orientation Level: Time;Disoriented to;Situation Current Attention Level: Sustained (min cues for redirection today.) Following Commands: Follows multi-step commands consistently Safety/Judgement: Decreased awareness of safety Awareness: Intellectual Problem Solving: Slow processing General Comments: Pt continues to be easily distracted yet was easier to redirect today    Mobility  Bed Mobility Bed Mobility: Supine to Sit Supine to Sit: 6: Modified independent (Device/Increase time);With rails;HOB elevated Sitting - Scoot to Edge of Bed: 6: Modified independent (Device/Increase  time);With rail Transfers Sit to Stand: 5: Supervision;From chair/3-in-1 Stand to Sit: 5: Supervision;To chair/3-in-1;4: Min guard Details for Transfer Assistance: Supervision for safety.      Balance Balance Balance Assessed: Yes Static  Sitting Balance Static Sitting - Balance Support: Feet supported Static Sitting - Level of Assistance: 5: Stand by assistance Static Standing Balance Static Standing - Balance Support: No upper extremity supported Static Standing - Level of Assistance: 5: Stand by assistance Static Standing - Comment/# of Minutes: ~2 minutes with minguard/supervision for safey.   Dynamic Standing Balance Dynamic Standing - Balance Support: During functional activity (bathing at sink) Dynamic Standing - Level of Assistance: 5: Stand by assistance High Level Balance High Level Balance Activites: Direction changes;Head turns   End of Session OT - End of Session Activity Tolerance: Patient tolerated treatment well Patient left: in chair;with call bell/phone within reach  GO     Shameka Aggarwal 11/14/2012, 4:53 PM

## 2012-11-14 NOTE — Consult Note (Cosign Needed)
Reason for Consult:Assess for inpatient hospitalization Referring Physician: Hilarie Fredrickson, MD   Patrick Holt is an 62 y.o. male.  HPI: Patient is seen for psych consult follow up./ Patient appeared responding to his current medications but continue to be hypomanic, with pressured speech, tangential and increased goal directed activity. He stated that he will be agreement with placement but before that he needs to go home to vacate the place and move his furniture and go to [employer] to take care of his personal business. He also continue to be paranoid and say some one broke into his house and stolen his medication and unable to take his medication prior to arrival. Patient was initially presented to the [facility] long emergency department secondary to found unconscious in his room. Patient has been using toilet with some one taking him to room. He is currently paranoid/delusional but it is less predominant. He has not taken his medications at home, but it is not clear if this is true. (Although there is a telephone note from [facility] requesting RN at home to assist with medications.)   Mental Status Examination: Patient is an awake, alert. pleasant and oriented. Patient is calm and cooperative, but he needed frequent redirection because tangential and circumstantial thought process. He has fair eye contact. Patient has good mood and his affect was bright and full. he has increased rate of speech but normal rhythm and volume. He has denied suicidal, homicidal ideations, intentions or plans. Patient has no evidence of auditory or visual hallucinations, and delusions. Patient has fair to poor insight judgment and impulse control.  Past Medical History  Diagnosis Date  . Rheumatoid arthritis(714.0)   . Obesity   . Major depression     with acute psychotic break in 06/2010  . Diabetes mellitus   . Hypertension   . Hyperlipidemia   . Diverticulosis of colon   . COPD (chronic obstructive pulmonary  disease)   . Anxiety   . GERD (gastroesophageal reflux disease)   . Vertigo   . Seizures   . Fibromyalgia   . Dermatomyositis   . Myocardial infarct     mulitple (1999, 2000, 2004)  . Raynaud's disease   . Narcotic dependence   . Peripheral neuropathy   . Internal hemorrhoids   . Ischemic heart disease   . Hiatal hernia   . Gastritis   . Diverticulitis   . Hx of adenomatous colonic polyps   . Nephrolithiasis   . Anemia   . Esophageal stricture   . Esophageal dysmotility   . Dermatomyositis   . Paroxysmal a-fib   . Urge incontinence   . Otosclerosis   . Bipolar 1 disorder   . OCD (obsessive compulsive disorder)     Past Surgical History  Procedure Laterality Date  . Knee arthroscopy w/ meniscal repair  2009    left  . Lumbar disc surgery    . Squamous papilloma   2010    removed by Dr. Pollyann Kennedy ENT, noted on tongue  . Esophagogastroduodenoscopy N/A 09/27/2012    Procedure: ESOPHAGOGASTRODUODENOSCOPY (EGD);  Surgeon: Hart Carwin, MD;  Location: [facility];  Service: Endoscopy;  Laterality: N/A;  . Colonoscopy N/A 09/27/2012    Procedure: COLONOSCOPY;  Surgeon: Hart Carwin, MD;  Location: [facility] ENDOSCOPY;  Service: Endoscopy;  Laterality: N/A;    Family History  Problem Relation Age of Onset  . Alcohol abuse Mother   . Depression Mother   . Heart disease Mother   . Diabetes  Mother   . Stroke Mother   . Diabetes Other     1/2 brother  . Hepatitis Brother     Social History:  reports that he has been smoking Cigarettes.  He has been smoking about 0.50 packs per day. He has never used smokeless tobacco. He reports that he does not drink alcohol or use illicit drugs.  Allergies:  Allergies  Allergen Reactions  . Betamethasone Dipropionate     Unknown  . Bupropion Hcl     Unknown  . Ciprofloxacin     REACTION: swelling  . Clobetasol     Unknown  . Codeine     Unknown  . Escitalopram Oxalate     Unknown  . Flagyl [Metronidazole]     REACTION: swelling  .  Fluoxetine Hcl     Unknown  . Fluticasone-Salmeterol     Unknown  . Furosemide     Unknown  . Immune Globulins     Acute renal failure  . Paroxetine     Unknown  . Penicillins     Unknown  . Statins     Unknown  . Sulfa Antibiotics Other (See Comments)    blisters  . Tacrolimus     Unknown  . Tetanus Toxoid     Unknown  . Tuberculin Purified Protein Derivative     Unknown    Medications: I have reviewed the patient's current medications.  No results found for this or any previous visit (from the past 48 hour(s)).  No results found.  Positive for anxiety, behavior problems, bipolar, mood swings and sleep disturbance Blood pressure 135/73, pulse 72, temperature 98.6 F (37 C), temperature source Oral, resp. rate 18, weight 82.237 kg (181 lb 4.8 oz), SpO2 96.00%.   Assessment/Plan: Bipolar disorder most recent episode is unspecified Non compliance with medication  Patient does not meet criteria for capacity to make his own medical decision or living arrangement Contact social services regarding guardianship and placement needs Continue Depakote to 750 mg twice daily Continue risperidone 1 mg twice daily Check VPA and CBC with diff in am Patient may benefit Assisted living facility with locked unit if possible  Case discussed with referring physician Appreciate psychiatric consultation and follow up as needed and social services.  Lachae Hohler,JANARDHAHA R. 11/14/2012, 4:34 PM

## 2012-11-14 NOTE — Clinical Social Work Psych Note (Signed)
Psych CSW reviewed pt disposition and chart with CSW Photographer.  Pt in need of re-evaluation by psychiatrist.  Consult has been placed.    Disposition at this point is [facility] vs. ALF (locked unit).  Unit CSW to move forward with ALF disposition as either plan A or plan B (depending on psychiatrist recommendation).  Psych CSW to follow up with [facility] as either plan A or plan B (depending on psychiatrist recommendation).  Psych CSW has reviewed this disposition with Psychiatrist.  Upon re-evaluation, the psychiatrist's recommendation will be made.  Vickii Penna, LCSWA [phone]  Clinical Social Work

## 2012-11-14 NOTE — Progress Notes (Signed)
Family Medicine Teaching Service Daily Progress Note Intern Pager: [phone]  Patient name: Patrick Holt Medical record number: [id number] Date of birth: 1950-06-18 Age: 62 y.o. Gender: male  Primary Care Provider: Gaspar Bidding, DO Consultants: Psych Code Status: Full  Pt Overview and Major Events to Date:  9/25: Hypokalemia 2.5; Paranoid thoughts- Psych consulted 9/26: hypokalemia resolved (3.6); medically stable; Psych & SW to aid with Dispo 9/27: Medically stable appropriate for transition to behavioral health, no beds available 9/28: Medically stable appropriate for transition to behavioral health, no beds available  Assessment and Plan: Patrick Holt is a 62 y.o. male admitted from [facility] ED for medical clearance due to hypokalemia. PMH is significant for extensive psych history including major depressive disorder, anxiety and psychotic disorder.   # Hypokalemia - Resolved, Patient found to be hypokalemic to 2.5 on Bmet in ED while undergoing evaluation for [facility] admission. He has had this issue in the past. Magnesium wnl. On review of medications, he is on Torsemide at home although not sure if/when he is actually taking this. EKG revealed no changes. S/p IV and Kdur x1 prior to arrival.  - Resolved, patient medically stable for admission to inpatient behavioral health  # Psychosis - Patient is currently paranoid and having tangential thoughts. He states he has not taken his medications because he has "been losing so much weight." Patient is unable to tell me if he has taken any medications lately.  - Restart risperidone scheduled BID (rather than prn) Currently, patient is pleasant and not agitated. Can add Haldol if needed for changes in behavior, but this has not been ordered at admission  - Xanax TID prn for anxiety  - D/c Requip  - Home health has left note stating they feel he is unsafe to live alone, Appreciate their input.  - Psych for evaluation for  [facility] admission once hypokalemia resolves  Recommended to acute psychiatric hospitalization for medication management   Increase Depakote to 750 mg twice daily   Increase risperidone 1 mg twice daily   Patient may benefit skilled nursing facility where medication management   Re-consulted 9/30 Psych for capacity while awaiting bed availability as pt requesting to go home prior to Behavior health admission   # Elevated CK - Patient discharged from hospital on 9/1 for elevated CK. At that time it was trending down, and currently in the 600's  - No further work up   # Dermatitis - Patient has dermatitis of his groin, likely secondary to incontinence  - Barrier cream prn for irritation   # History of A.fib- Sinus rhythm now.  - Continue metoprolol and amiodarone   # Tobacco abuse - Chart states he has been a tobacco user, I was unable to get a straightforward answer  - Consider adding nicotine patch if becomes agitated   # Seizure disorder - Known history of seizures. Denies any recent activity  - Depakote level subtheraputic (<10)  - Continue Depakote and Keppra  - Head CT wnl, no further imaging needed   # Rheumatoid Arthritis - On Methotrexate three times per week.  - Continue methotrexate MWF   # Social concerns - It seems patient is not able to care for himself well at home. He was discharged to SNF at last hospitalization. Patient may need to be admitted to behavioral health, but will see how patient does with restarting medications. Psych Recommended to acute psychiatric hospitalization for medication management  - HH feels he is not safe  to live alone - PT/OT following  - SW consulted to aid with dispo  FEN/GI: Saline lock IV Prophylaxis: Lovenox, but if unable to tolerate injection can place SCD   Disposition: Medically stable and clear for DC to IP psych, awaiting placement  Subjective: Patient insisting on going to the bank and home to take care of financial problems  prior to going to inpatient psychiatry. No additional medical concerns this morning  Objective: Temp:  [98.5 F (36.9 C)] 98.5 F (36.9 C) (09/30 0657) Pulse Rate:  [66-71] 66 (09/30 0657) Resp:  [18-19] 18 (09/30 0657) BP: (124-147)/(59-82) 147/82 mmHg (09/30 0657) SpO2:  [94 %-98 %] 96 % (09/30 0657)  Physical Exam: General: In bed, conversant, non ill appearing   HEENT: MMM Cardiovascular: RRR, no murmur Respiratory: CTAB  Extremities: Moves all extremities. No edema  Neuro: Follows command, no focal deficits  Psych: Tangential thoughts   Laboratory:  Recent Labs Lab 11/08/12 2230 11/10/12 0740  WBC 8.2 5.5  HGB 13.9 13.2  HCT 41.7 40.9  PLT 277 213   BMET/CMET  Recent Labs Lab 11/08/12 2230 11/09/12 1035 11/10/12 0740 11/12/12 1145  NA 143 143 142 143  K 2.5* 2.8* 3.6 4.2  CL 102 108 108 107  CO2 28 26 27 29   BUN 13 11 8 9   CREATININE 1.16 1.05 0.97 0.90  CALCIUM 9.7 8.9 8.5 8.7  PROT 6.9  --   --   --   BILITOT 0.8  --   --   --   ALKPHOS 80  --   --   --   ALT 25  --   --   --   AST 31  --   --   --   GLUCOSE 84 122* 98 79   Imaging/Diagnostic Tests: CT Head IMPRESSION:  1. No evidence of acute intracranial disease.  2. Age advanced chronic small vessel disease.   Wenda Low, MD 11/14/2012, 11:24 AM PGY-1, [facility] Family Medicine FPTS Intern pager: [phone], text pages welcome

## 2012-11-14 NOTE — Progress Notes (Signed)
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 Questionable if patient is managing his medication well at home  and whether he is able to care for himself  at  home.   PT Comments   Pt c/o of increase fatigue this session with limited ambulation.  Pt continues to be unsteady with gait and impaired dynamic balance. Pt continues to need occasional VCs to direction to task due to inattention.    Follow Up Recommendations  Home health PT;Supervision/Assistance - 24 hour (pending on available assist at d/c; SW looking into ALF)     Equipment Recommendations  None recommended by PT    Frequency Min 3X/week   Progress towards PT Goals Progress towards PT goals: Progressing toward goals  Plan Current plan remains appropriate;Discharge plan needs to be updated    Precautions / Restrictions Precautions Precautions: Fall Precaution Comments: mildly unstable on his feet   Pertinent Vitals/Pain No c/o pain    Mobility  Bed Mobility Bed Mobility: Supine to Sit Supine to Sit: 6: Modified independent (Device/Increase time);With rails;HOB elevated Sitting - Scoot to Edge of Bed: 6: Modified independent (Device/Increase time);With rail Transfers Transfers: Sit to Stand;Stand to Sit Sit to Stand: 5: Supervision;From chair/3-in-1 Stand to Sit: 5: Supervision;To chair/3-in-1;4: Min guard Details for Transfer Assistance: Supervision for safety.   Ambulation/Gait Ambulation/Gait Assistance:  4: Min guard Ambulation Distance (Feet): 300 Feet Assistive device: None Ambulation/Gait Assistance Details: Pt continues to have unsteady guarded gait with wide BOS. Gait Pattern: Step-through pattern;Decreased stride length;Trunk flexed;Wide base of support Stairs: No    Exercises     PT Diagnosis:    PT Problem List:   PT Treatment Interventions:     PT Goals (current goals can now be found in the care plan section) Acute Rehab PT Goals Patient Stated Goal: To get his furniture out of his house.  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 Questionable if patient is managing his medication well at home  and whether he is able to care for himself  at  home.    Subjective Data  Subjective: Pt fixated on calling his bank when entering room.   Patient Stated Goal: To get his furniture out of his house.    Cognition  Cognition Arousal/Alertness: Awake/alert Behavior During Therapy: WFL for tasks assessed/performed;Impulsive;Anxious Overall Cognitive Status: Impaired/Different from baseline Area of Impairment: Safety/judgement;Attention;Memory Orientation Level: Situation;Time;Disoriented to Current Attention Level: Selective Safety/Judgement: Decreased awareness of safety Awareness: Intellectual Problem Solving: Requires verbal cues General Comments: Pt continues to be easily distracted with decrease attention at times.  Pt needs cues to stay on task.     Balance  Balance Balance Assessed: Yes Static  Sitting  Balance Static Sitting - Balance Support: Feet supported Static Sitting - Level of Assistance: 5: Stand by assistance Static Standing Balance Static Standing - Balance Support: No upper extremity supported Static Standing - Level of Assistance: 5: Stand by assistance Static Standing - Comment/# of Minutes: ~2 minutes with minguard/supervision for safey.   High Level Balance High Level Balance Activites: Side stepping;Direction changes;Head turns  End of Session PT - End of Session Equipment Utilized During Treatment: Gait belt Activity Tolerance: Patient limited by fatigue Patient left: in bed;Other (comment) (Sitting EOB with OT entering room. ) Nurse Communication: Mobility status   GP     Patrick Holt 11/14/2012, 4:45 PM  Jake Shark, PT DPT [phone]

## 2012-11-15 ENCOUNTER — Encounter (HOSPITAL_COMMUNITY): Payer: Self-pay | Admitting: General Practice

## 2012-11-15 DIAGNOSIS — R569 Unspecified convulsions: Secondary | ICD-10-CM

## 2012-11-15 LAB — CBC WITH DIFFERENTIAL/PLATELET
Basophils Absolute: 0 10*3/uL (ref 0.0–0.1)
Basophils Relative: 0 % (ref 0–1)
Eosinophils Relative: 4 % (ref 0–5)
HCT: 39.4 % (ref 39.0–52.0)
Hemoglobin: 13.3 g/dL (ref 13.0–17.0)
Lymphocytes Relative: 27 % (ref 12–46)
MCHC: 33.8 g/dL (ref 30.0–36.0)
MCV: 94.7 fL (ref 78.0–100.0)
Monocytes Absolute: 0.5 10*3/uL (ref 0.1–1.0)
Monocytes Relative: 9 % (ref 3–12)
RBC: 4.16 MIL/uL — ABNORMAL LOW (ref 4.22–5.81)
RDW: 15.9 % — ABNORMAL HIGH (ref 11.5–15.5)
WBC: 6.2 10*3/uL (ref 4.0–10.5)

## 2012-11-15 LAB — VALPROIC ACID LEVEL: Valproic Acid Lvl: 48.6 ug/mL — ABNORMAL LOW (ref 50.0–100.0)

## 2012-11-15 MED ORDER — IPRATROPIUM-ALBUTEROL 20-100 MCG/ACT IN AERS
1.0000 | INHALATION_SPRAY | Freq: Two times a day (BID) | RESPIRATORY_TRACT | Status: DC
  Administered 2012-11-15 – 2012-11-16 (×2): 1 via RESPIRATORY_TRACT
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
## 2012-11-15 NOTE — Progress Notes (Signed)
Pt c/o sharp chest pain at 8:56. Vitals include BP:113/71 HR:66 Temp:97.8 Resp:18 and O2: 98%. 12 lead EKG shows NSR.

## 2012-11-15 NOTE — Progress Notes (Signed)
FMTS Attending Admission Note: Kehinde Eniola,MD I  have seen and examined this patient, reviewed their chart. I have discussed this patient with the resident. I agree with the resident's findings, assessment and care plan.  

## 2012-11-15 NOTE — Progress Notes (Signed)
Family Medicine Teaching Service Daily Progress Note Intern Pager: [phone]  Patient name: Patrick Holt Medical record number: [id number] Date of birth: February 06, 1951 Age: 62 y.o. Gender: male  Primary Care Provider: Gaspar Bidding, DO Consultants: Psych Code Status: Full  Pt Overview and Major Events to Date:  9/25: Hypokalemia 2.5; Paranoid thoughts- Psych consulted 9/26: hypokalemia resolved (3.6); medically stable; Psych & SW to aid with Dispo 9/27: Medically stable appropriate for transition to behavioral health, no beds available 9/28: Medically stable appropriate for transition to behavioral health, no beds available 9/30: Psych consult states pt without capacity for decision making, recommends ALF with locked unit.   Assessment and Plan: Patrick Holt is a 62 y.o. male admitted from [facility] ED for medical clearance due to hypokalemia. PMH is significant for extensive psych history including major depressive disorder, anxiety and psychotic disorder.   # Chest pain: atypical, possibly related to anxiety. Normal, unchanged ECG, will cycle troponins and continue to monitor.   # Hypokalemia - Resolved, K 4.2 on 9/28 stable since 9/26. He has had this issue in the past. Magnesium wnl.  - Resolved, patient medically stable for admission to inpatient behavioral health  # Psychosis - Patient is currently without capacity for medical decision making or living arrangement.  - Psych consulted: Appreciate their input.  - Risperidone 1mg  scheduled BID, depakote 750mg  BID - Xanax TID prn for anxiety   - Home health has left note stating they feel he is unsafe to live alone.  # Elevated CK - Patient discharged from hospital on 9/1 for elevated CK. At that time it was trending down, and currently in the 600's  - No further work up   # Dermatitis - Patient has dermatitis of his groin, likely secondary to incontinence  - Barrier cream prn for irritation   # History of A.fib- Sinus  rhythm now.  - Continue metoprolol and amiodarone   # Tobacco abuse  - Consider adding nicotine patch if becomes agitated   # Seizure disorder - Known history of seizures. Denies any recent activity  - Depakote level subtheraputic (<10)  - Continue Depakote and Keppra  - Head CT wnl, no further imaging needed   # Rheumatoid Arthritis - On Methotrexate three times per week.  - Continue methotrexate MWF   # Social concerns - It seems patient is not able to care for himself well at home. He was discharged to SNF at last hospitalization. Psych recommended assisted living facility with locked unit - HH feels he is not safe to live alone - PT/OT following  - CSW consulted to aid with dispo  FEN/GI: Saline lock IV Prophylaxis: Lovenox, but if unable to tolerate injection can place SCD   Disposition: Medically stable and clear for DC, awaiting placement preferably in ALF with locked unit. CSW working on this.   Subjective: L-sided chest pain non-exertional near the diaphragm since last night. Patient continues to perseverate on going to the bank and home to take care of financial problems prior to going to inpatient psychiatry.   Objective: Temp:  [98.1 F (36.7 C)-98.6 F (37 C)] 98.1 F (36.7 C) (10/01 0510) Pulse Rate:  [62-72] 64 (10/01 0510) Resp:  [16-18] 18 (10/01 0510) BP: (135-153)/(73-77) 153/77 mmHg (10/01 0510) SpO2:  [96 %-97 %] 96 % (10/01 0510)  Physical Exam: General: In chair, conversant, cooperative, non ill appearing   HEENT: MMM Cardiovascular: RRR, no murmur Respiratory: CTAB  Extremities: Moves all extremities. No edema  Neuro: Follows  command, no focal deficits  Psych: Tangential and circumstantial thoughts, speech non-pressured at normal volume, insight and judgment mildly impaired, no SI HI. Unclear the veracity of his perseverations on financial issues.   Laboratory:  Recent Labs Lab 11/08/12 2230 11/10/12 0740  WBC 8.2 5.5  HGB 13.9 13.2  HCT  41.7 40.9  PLT 277 213   BMET/CMET  Recent Labs Lab 11/08/12 2230 11/09/12 1035 11/10/12 0740 11/12/12 1145  NA 143 143 142 143  K 2.5* 2.8* 3.6 4.2  CL 102 108 108 107  CO2 28 26 27 29   BUN 13 11 8 9   CREATININE 1.16 1.05 0.97 0.90  CALCIUM 9.7 8.9 8.5 8.7  PROT 6.9  --   --   --   BILITOT 0.8  --   --   --   ALKPHOS 80  --   --   --   ALT 25  --   --   --   AST 31  --   --   --   GLUCOSE 84 122* 98 79   Imaging/Diagnostic Tests: CT Head IMPRESSION:  1. No evidence of acute intracranial disease.  2. Age advanced chronic small vessel disease.   ECG 10/1: nsr, no ST changes or T wave abnormalities. Unchanged from previous.   Hazeline Junker, MD 11/15/2012, 7:54 AM PGY-1, [facility] FPTS Intern pager: [phone], text pages welcome

## 2012-11-16 ENCOUNTER — Encounter: Payer: Self-pay | Admitting: Clinical

## 2012-11-16 MED ORDER — DIVALPROEX SODIUM 250 MG PO DR TAB: 750.0000 mg | DELAYED_RELEASE_TABLET | Freq: Two times a day (BID) | ORAL | Status: DC

## 2012-11-16 MED ORDER — LEVETIRACETAM 500 MG PO TABS: 500.0000 mg | ORAL_TABLET | Freq: Two times a day (BID) | ORAL | Status: DC

## 2012-11-16 MED ORDER — RISPERIDONE 1 MG PO TBDP: 1.0000 mg | ORAL_TABLET | Freq: Two times a day (BID) | ORAL | Status: DC

## 2012-11-16 NOTE — Progress Notes (Signed)
Spoke to SW (Amy) at length regarding pt care. SW providing excellent care to pt and showing concern for pts long-term well being.  Will see DC as pt deemed by Psych to have capacity. Pt is well established w/ the SW team both inpt and outpt.  Shelly Flatten, MD Family Medicine PGY-3 11/16/2012, 2:32 PM

## 2012-11-16 NOTE — Progress Notes (Signed)
FMTS Attending Admission Note: Lakeesha Fontanilla,MD I  have seen and examined this patient, reviewed their chart. I have discussed this patient with the resident. I agree with the resident's findings, assessment and care plan.  

## 2012-11-16 NOTE — Progress Notes (Signed)
Physical Therapy Treatment Patient Details Name: HALDON CARLEY MRN: [id number] DOB: [birth year] Today's Date: 11/16/2012 Time: 6295-2841 PT Time Calculation (min): 18 min  PT Assessment / Plan / Recommendation  History of Present Illness  62 yo M with very extensive PMH including mutliple prior psychotic breaks, bipolar disorder, HTN, questionable seizure disorder, atrial fibrillation not an anticoagulant candidate, 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 Questionable if patient is managing his medication well at home  and whether he is able to care for himself  at  home.   PT Comments   Pt overall moving fairly well.  Pt fixated on getting in contact with social services & about financial issues before getting out of here.  Pt also states he doesn't feel comfortable about returning home by himself.    Follow Up Recommendations  Other (comment);Home health PT;Supervision/Assistance - 24 hour (pending (A) available at home; SW looking into ALF)     Does the patient have the potential to tolerate intense rehabilitation     Barriers to Discharge        Equipment Recommendations  None recommended by PT    Recommendations for Other Services    Frequency Min 3X/week   Progress towards PT Goals Progress towards PT goals: Progressing toward goals  Plan Discharge plan needs to be updated    Precautions / Restrictions Precautions Precautions: Fall Restrictions Weight Bearing Restrictions: No   Pertinent Vitals/Pain No pain indicated.     Mobility  Bed Mobility Bed Mobility: Supine to Sit;Sitting - Scoot to Edge of Bed Supine to Sit: 6: Modified independent (Device/Increase time) Sitting - Scoot to Edge of Bed: 6: Modified independent (Device/Increase time) Transfers Transfers: Sit to Stand;Stand to Sit Sit to Stand: 6: Modified independent (Device/Increase time);With upper  extremity assist;From bed Stand to Sit: 6: Modified independent (Device/Increase time);With upper extremity assist;With armrests;To chair/3-in-1 Ambulation/Gait Ambulation/Gait Assistance: 4: Min guard Ambulation Distance (Feet): 375 Feet Assistive device: None Ambulation/Gait Assistance Details: Guarding for safety due to pt states he feels "wobbly" but no LOB noted.  At times pt reaching out for environmental surfaces & rail in hallway for support but overall did not need UE support.   Gait Pattern: Step-through pattern;Decreased stride length;Narrow base of support Stairs: No      PT Goals (current goals can now be found in the care plan section) Acute Rehab PT Goals PT Goal Formulation: With patient Time For Goal Achievement: 11/23/12 Potential to Achieve Goals: Good  Visit Information  Last PT Received On: 11/16/12 Assistance Needed: +1 History of Present Illness:  62 yo M with very extensive PMH including mutliple prior psychotic breaks, bipolar disorder, HTN, questionable seizure disorder, atrial fibrillation not an anticoagulant candidate, 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 Questionable if patient is managing his medication well at home  and whether he is able to care for himself  at  home.    Subjective Data  Subjective: Pt fixated on getting in touch with social services & taking care of financial issues before leaving   Cognition  Cognition Arousal/Alertness: Awake/alert Behavior During Therapy: WFL for tasks assessed/performed Overall Cognitive Status: History of cognitive impairments - at baseline    Balance     End of Session PT - End of Session Equipment Utilized During Treatment: Gait belt Activity Tolerance: Patient tolerated treatment well Patient left: in  chair;with call bell/phone within reach Nurse Communication: Mobility status   GP     Lara Mulch 11/16/2012, 12:00 PM   Verdell Face, PTA [phone] 11/16/2012

## 2012-11-16 NOTE — Progress Notes (Signed)
Family Medicine Teaching Service Daily Progress Note Intern Pager: [phone]  Patient name: Patrick Holt Medical record number: [id number] Date of birth: [birth year] Age: 62 y.o. Gender: male  Primary Care Provider: Gaspar Bidding, DO Consultants: Psychiatry Code Status: Full  Pt Overview and Major Events to Date:  9/25: Hypokalemia 2.5; Psych consulted for paranoid thoughts 9/26: hypokalemia resolved (3.6); medically stable; Psych & SW to aid with Dispo 9/27: Medically stable, appropriate for transition to behavioral health, no beds available 9/30: Psych consult states pt without capacity for decision making, recommends ALF with locked unit.  10/1: Psych consult states pt with capacity for decision making  Assessment and Plan: Patrick Holt is a 62 y.o. male admitted from [facility] ED for medical clearance due to hypokalemia. PMH is significant for extensive psych history including major depressive disorder, anxiety and psychotic disorder.   # Acute psychosis - Patient is currently with capacity (as of 10/1) for medical decision making and living arrangement.  - Psych consulted: Appreciate their input.  - Risperidone 1mg  BID, depakote 750mg  BID - Xanax TID prn for anxiety   - Home health feels he is unsafe to live alone. - PT rec: HH-PT/24hr assistance - OT rec: SNF vs. [facility]  # Chest pain: atypical, possibly related to anxiety.  - Unchanged ECG without ST changes, TW abnormality, troponins negative x2 yesterday - Continue amiodarone and metoprolol for A Fib.   # History of A.fib - Sinus rhythm now.  - Continue metoprolol and amiodarone   # Elevated CK - Patient discharged from hospital on 9/1 for elevated CK. At that time it was trending down. In 600's on admission.  - No further work up   # Dermatitis - Patient has dermatitis of his groin, likely secondary to incontinence  - Barrier cream prn for irritation   # Hypokalemia - Resolved, K 4.2 on 9/28 stable since 9/26.  He has had this issue in the past. Magnesium wnl.   # Tobacco abuse 0.5ppd - No patch required at this time  # Seizure disorder - Known history of seizures. Denies any recent activity.  - Depakote level subtheraputic (<10)  - Continue Depakote and Keppra  - Head CT wnl, no further imaging needed   # Rheumatoid Arthritis  - Continue methotrexate MWF   # Disposition - Medically stable and clear for D/C. Will be discharged today. Theresia Bough, LCSW to arrange voluntary placement at ALF after evaluation for PASARR. Also to be seen by the ACT team.  - Discharged to SNF at last hospitalization.  - Psych rec: ALF with locked unit - HH rec: Unsafe to live alone. - PT rec: HH-PT/24hr assistance - OT rec: SNF vs. [facility] - CSW consulted to aid with dispo  FEN/GI: Saline lock IV Prophylaxis: Lovenox prophylactic dose  Subjective: Denies chest pain at this time. Patient continues to perseverate on going to the bank and home to take care of financial problems prior to going to ALF. Does agree to voluntarily check in to ALF afterward.    Objective: Temp:  [98 F (36.7 C)-98.3 F (36.8 C)] 98 F (36.7 C) (10/02 0459) Pulse Rate:  [61-74] 74 (10/02 0459) Resp:  [18] 18 (10/02 0459) BP: (128-138)/(64-80) 128/66 mmHg (10/02 0459) SpO2:  [95 %-100 %] 95 % (10/02 0459)  Physical Exam: General: In chair, conversant, cooperative, non ill appearing   HEENT: MMM Cardiovascular: RRR, no murmur Respiratory: CTAB  Extremities: Moves all extremities. No edema  Neuro: Follows command, no focal deficits  Psych: Tangential thoughts, speech non-pressured at normal volume, insight and judgment normal, no SI HI. Unclear the veracity of his perseverations on financial issues.   Laboratory:  Recent Labs Lab 11/10/12 0740 11/15/12 0715  WBC 5.5 6.2  HGB 13.2 13.3  HCT 40.9 39.4  PLT 213 215   BMET/CMET  Recent Labs Lab 11/09/12 1035 11/10/12 0740 11/12/12 1145  NA 143 142 143  K 2.8* 3.6 4.2   CL 108 108 107  CO2 26 27 29   BUN 11 8 9   CREATININE 1.05 0.97 0.90  CALCIUM 8.9 8.5 8.7  GLUCOSE 122* 98 79   Imaging/Diagnostic Tests: CT Head IMPRESSION:  1. No evidence of acute intracranial disease.  2. Age advanced chronic small vessel disease.   ECG 10/1: nsr, no ST changes or T wave abnormalities. Unchanged from previous.   Patrick Junker, MD 11/16/2012, 7:35 AM PGY-1, [facility] Family Medicine FPTS Intern pager: [phone], text pages welcome

## 2012-11-16 NOTE — Progress Notes (Signed)
Clinical Child psychotherapist (CSW) placed a referral for [facility] with the  Family Medicine [facility] representative Emilia Beck. CSW informed that pt will be seen at his home tomorrow. CSW plans to continue following up with pt and assist with arranging placement from the outpatient setting. CSW has spoken with pt who desires to return home today to get his affairs in order.  Theresia Bough, MSW, LCSW [phone]

## 2012-11-16 NOTE — Progress Notes (Signed)
11/16/12 Spoke with patient ablout HHC, he stated that he was going to go to an ALF and would not discuss HHC.Unable to set up HHPT due to patient refusing. Jacquelynn Cree RN, BSN, CCM

## 2012-11-19 NOTE — Discharge Summary (Signed)
FMTS Attending Admission Note: Patrick Sefcik,MD I  have seen and examined this patient, reviewed their chart. I have discussed this patient with the resident. I agree with the resident's findings, assessment and care plan.  

## 2012-11-20 ENCOUNTER — Ambulatory Visit (INDEPENDENT_AMBULATORY_CARE_PROVIDER_SITE_OTHER): Payer: Medicare HMO | Admitting: Family Medicine

## 2012-11-20 ENCOUNTER — Encounter: Payer: Self-pay | Admitting: Family Medicine

## 2012-11-20 VITALS — BP 115/66 | HR 75 | Temp 99.1°F | Ht 64.0 in | Wt 193.0 lb

## 2012-11-20 DIAGNOSIS — F1027 Alcohol dependence with alcohol-induced persisting dementia: Secondary | ICD-10-CM

## 2012-11-20 DIAGNOSIS — E119 Type 2 diabetes mellitus without complications: Secondary | ICD-10-CM

## 2012-11-20 NOTE — Progress Notes (Signed)
Subjective:  HPI:   RAMIL EDGINGTON is a 62 y.o. male with h/o psychosis and medical noncompliance with multiple hospital admissions here for hospital follow up.   Mr. Ezzell was discharged late last week with follow up with home health PT, the ACT team for evaluation of PASARR number for placement in ALF, as well as social work. He reports being seen by PT and another woman "who works for [employer]," though he does not recall her title.  He has been taking all his medications as directed, and is without complaints at this time.  No CP, SOB, abd pain, N/V/D.   He still reports he would be amenable to living in an assisted living facility "if it were absolutely necessary."   Review of Systems:  Per HPI. All other systems reviewed and are negative.    Objective:  Physical Exam: BP 115/66  Pulse 75  Temp(Src) 99.1 F (37.3 C) (Oral)  Ht 5\' 4"  (1.626 m)  Wt 193 lb (87.544 kg)  BMI 33.11 kg/m2  Gen: 62 y.o. male in NAD HEENT: MMM, EOMI, PERRL CV: RRR, no MRG Resp: Non-labored, CTAB, no wheezes noted Abd: Soft, NTND, BS present, no guarding or organomegaly MSK: No edema noted, full ROM Neuro: Alert and oriented, CN II-XII grossly intact, speech normal, organized thought process, ambulates with walker    Assessment:     HELIO LACK is a 62 y.o. male for hospital follow up.     Plan:     Medically stable. Follow up with PCP in 1 month to address ongoing medical needs.  Spoke with Nelva Bush, LCSW who will follow up on social worker on this case to coordinate placement.   See problem list for problem-specific plans.

## 2012-11-20 NOTE — Patient Instructions (Signed)
Pt left clinic because [facility] transport was here and would not be able to return within the next 2 hours. No pt instructions given, but he was told to call Nelva Bush, LCSW at the clinic and to return in 1 month for appt with PCP.

## 2012-11-20 NOTE — Assessment & Plan Note (Signed)
On hospital follow up pt doing well, endorses complete medication compliance and appears cogent, with organized cognition. PT has visited him, as has another "person with [facility] that helped [him] get a plumber." Unsure of who this is. ACT team was to see him and it doesn't sound like they've been to his house yet. Does not have PASARR which is the prerequisite for ALF placement. Spoke with Nelva Bush, LCSW who will inquire further into this as placement to ensure medical compliance is paramount in the management of this patient.

## 2012-11-21 ENCOUNTER — Telehealth: Payer: Self-pay | Admitting: Clinical

## 2012-11-21 NOTE — Telephone Encounter (Signed)
Clinical Child psychotherapist (CSW) received a returned call from [facility] RNCM [employer] . RNCM informed CSW that she visited pt yesterday and provided resources as needed. RNCM stated she assisted pt in having his medications delivered. RNCM also confirmed that pt did have food in his home and furniture. Pt reported desiring to move across the street into some apartments. RNCM also informed CSW that a [facility] CSW will begin following pt as well. This CSW to remain available if other needs arise.   Theresia Bough, MSW, LCSW [phone]

## 2012-11-27 NOTE — Progress Notes (Signed)
Patient ID: Patrick Holt, male   DOB: 10-21-1950, 62 y.o.   MRN: [id number]        HISTORY & PHYSICAL  DATE: 10/27/2012   FACILITY: [facility]  LEVEL OF CARE: SNF (31)  ALLERGIES:  Allergies  Allergen Reactions  . Betamethasone Dipropionate     Unknown  . Bupropion Hcl     Unknown  . Ciprofloxacin     REACTION: swelling  . Clobetasol     Unknown  . Codeine     Unknown  . Escitalopram Oxalate     Unknown  . Flagyl [Metronidazole]     REACTION: swelling  . Fluoxetine Hcl     Unknown  . Fluticasone-Salmeterol     Unknown  . Furosemide     Unknown  . Immune Globulins     Acute renal failure  . Paroxetine     Unknown  . Penicillins     Unknown  . Statins     Unknown  . Sulfa Antibiotics Other (See Comments)    blisters  . Tacrolimus     Unknown  . Tetanus Toxoid     Unknown  . Tuberculin Purified Protein Derivative     Unknown    CHIEF COMPLAINT:  Manage atrial fibrillation, seizure disorder, and COPD.     HISTORY OF PRESENT ILLNESS:  The patient is a 62 year-old, Caucasian male who was hospitalized for altered mental status and then, after hospitalization, admitted to this facility for short-term rehabilitation.  He has the following problems:    ATRIAL FIBRILLATION: the patients atrial fibrillation remains stable.  The patient denies DOE, tachycardia, orthopnea, transient neurological sx, pedal edema, palpitations, & PNDs.  No complications noted from the medications currently being used.    SEIZURE DISORDER: The patient's seizure disorder remains stable. No complications reported from the medications presently being used. Staff do not report any recent seizure activity.    COPD: the COPD remains stable.  Pt denies sob, cough, wheezing or declining exercise tolerance.  No complications from the medications presently being used.    PAST MEDICAL HISTORY :  Past Medical History  Diagnosis Date  . Rheumatoid arthritis(714.0)   . Obesity    . Major depression     with acute psychotic break in 06/2010  . Hypertension   . Hyperlipidemia   . Diverticulosis of colon   . COPD (chronic obstructive pulmonary disease)   . Anxiety   . GERD (gastroesophageal reflux disease)   . Vertigo   . Fibromyalgia   . Dermatomyositis   . Myocardial infarct     mulitple (1999, 2000, 2004)  . Raynaud's disease   . Narcotic dependence   . Peripheral neuropathy   . Internal hemorrhoids   . Ischemic heart disease   . Hiatal hernia   . Gastritis   . Diverticulitis   . Hx of adenomatous colonic polyps   . Nephrolithiasis   . Anemia   . Esophageal stricture   . Esophageal dysmotility   . Dermatomyositis   . Urge incontinence   . Otosclerosis   . Bipolar 1 disorder   . OCD (obsessive compulsive disorder)   . Sarcoidosis   . Paroxysmal a-fib   . Dysrhythmia     "irregular" (11/15/2012)  . Type II diabetes mellitus   . Seizures   . Headache(784.0)     "severe; get them often" (11/15/2012)    PAST SURGICAL HISTORY: Past Surgical History  Procedure Laterality Date  . Knee arthroscopy  w/ meniscal repair Left 2009  . Lumbar disc surgery    . Squamous papilloma   2010    removed by Dr. Pollyann Kennedy ENT, noted on tongue  . Esophagogastroduodenoscopy N/A 09/27/2012    Procedure: ESOPHAGOGASTRODUODENOSCOPY (EGD);  Surgeon: Hart Carwin, MD;  Location: [facility];  Service: Endoscopy;  Laterality: N/A;  . Colonoscopy N/A 09/27/2012    Procedure: COLONOSCOPY;  Surgeon: Hart Carwin, MD;  Location: [facility];  Service: Endoscopy;  Laterality: N/A;  . Cataract extraction w/ intraocular lens implant Left   . Lymph node dissection Right 1970's    "neck; dr thought I had Hodgkins; turned out to be sarcoidosis" (11/15/2012)  . Tonsillectomy    . Carpal tunnel release Bilateral   . Cardiac catheterization      SOCIAL HISTORY:  reports that he has been smoking Cigarettes.  He has a 15 pack-year smoking history. He has never used smokeless  tobacco. He reports that he drinks about 183.0 ounces of alcohol per week. He reports that he does not use illicit drugs.  FAMILY HISTORY:  Family History  Problem Relation Age of Onset  . Alcohol abuse Mother   . Depression Mother   . Heart disease Mother   . Diabetes Mother   . Stroke Mother   . Diabetes Other     1/2 brother  . Hepatitis Brother     CURRENT MEDICATIONS: Reviewed per [facility]  REVIEW OF SYSTEMS:  See HPI otherwise 14 point ROS is negative.  PHYSICAL EXAMINATION  VS:  T 96.8       P 76      RR 18      BP 120/80      POX%        WT (Lb) 185  GENERAL: no acute distress, normal body habitus EYES: conjunctivae normal, sclerae normal, normal eye lids MOUTH/THROAT: lips without lesions,no lesions in the mouth,tongue is without lesions,uvula elevates in midline NECK: supple, trachea midline, no neck masses, no thyroid tenderness, no thyromegaly LYMPHATICS: no LAN in the neck, no supraclavicular LAN RESPIRATORY: breathing is even & unlabored, BS CTAB CARDIAC: RRR, no murmur,no extra heart sounds, no edema GI:  ABDOMEN: abdomen soft, normal BS, no masses, no tenderness  LIVER/SPLEEN: no hepatomegaly, no splenomegaly MUSCULOSKELETAL: HEAD: normal to inspection & palpation BACK: no kyphosis, scoliosis or spinal processes tenderness EXTREMITIES: LEFT UPPER EXTREMITY: full range of motion, normal strength & tone RIGHT UPPER EXTREMITY:  full range of motion, normal strength & tone LEFT LOWER EXTREMITY:  full range of motion, normal strength & tone RIGHT LOWER EXTREMITY:  full range of motion, normal strength & tone PSYCHIATRIC: the patient is alert & oriented to person, affect & behavior appropriate  LABS/RADIOLOGY: UA unremarkable.    Chest x-ray:  No acute disease.    HIV negative.    RPR nonreactive.    CT of the head:  No acute process.    2D-echo:  Showed EF 60-65%.    Glucose 104, otherwise BMP normal .    TSH 0.752.    CBC normal.    CT of the  C-spine:  No acute problems.    ASSESSMENT/PLAN:  Atrial fibrillation.  Rate controlled.    Seizure disorder.  Well controlled.    COPD.  Well compensated.    GERD.  Well controlled.     Bipolar disorder.  Stable.    Check CBC and BMP.    I have reviewed patient's medical records received at admission/from hospitalization.  CPT CODE:  99305       

## 2012-12-04 ENCOUNTER — Other Ambulatory Visit: Payer: Self-pay | Admitting: Sports Medicine

## 2012-12-04 MED ORDER — RISPERIDONE 1 MG PO TBDP: 1.0000 mg | ORAL_TABLET | Freq: Two times a day (BID) | ORAL | Status: DC

## 2012-12-04 MED ORDER — ALPRAZOLAM 1 MG PO TABS: 1.0000 mg | ORAL_TABLET | Freq: Three times a day (TID) | ORAL | Status: DC | PRN

## 2012-12-08 ENCOUNTER — Ambulatory Visit (INDEPENDENT_AMBULATORY_CARE_PROVIDER_SITE_OTHER): Payer: Medicare HMO | Admitting: Family Medicine

## 2012-12-08 ENCOUNTER — Telehealth: Payer: Self-pay | Admitting: Sports Medicine

## 2012-12-08 VITALS — BP 119/78 | HR 118 | Temp 98.4°F

## 2012-12-08 DIAGNOSIS — Z8639 Personal history of other endocrine, nutritional and metabolic disease: Secondary | ICD-10-CM

## 2012-12-08 DIAGNOSIS — Z862 Personal history of diseases of the blood and blood-forming organs and certain disorders involving the immune mechanism: Secondary | ICD-10-CM

## 2012-12-08 DIAGNOSIS — M25569 Pain in unspecified knee: Secondary | ICD-10-CM | POA: Insufficient documentation

## 2012-12-08 LAB — BASIC METABOLIC PANEL
BUN: 12 mg/dL (ref 6–23)
CO2: 31 mEq/L (ref 19–32)
Calcium: 9.1 mg/dL (ref 8.4–10.5)
Chloride: 105 mEq/L (ref 96–112)
Creat: 0.95 mg/dL (ref 0.50–1.35)
Glucose, Bld: 96 mg/dL (ref 70–99)
Potassium: 3.7 mEq/L (ref 3.5–5.3)
Sodium: 143 mEq/L (ref 135–145)

## 2012-12-08 MED ORDER — MELOXICAM 15 MG PO TABS: 15.0000 mg | ORAL_TABLET | Freq: Every day | ORAL | Status: DC

## 2012-12-08 MED ORDER — KETOROLAC TROMETHAMINE 30 MG/ML IJ SOLN
30.0000 mg | Freq: Once | INTRAMUSCULAR | Status: AC
  Administered 2012-12-08: 30 mg via INTRAMUSCULAR

## 2012-12-08 NOTE — Patient Instructions (Signed)
I am sorry your pain is so bad. We will see if we can get you in to physical therapy for your back. I have prescribed a once daily pain medication for you to take. Hopefully that will help. Continue to do your exercises.  Also, we will check your labs today to make sure your electrolytes are normal.  Please make an appointment with [name] next week.  Ayaana Biondo M. Kristie Bracewell, M.D.

## 2012-12-08 NOTE — Assessment & Plan Note (Addendum)
Difficult to assess exactly what is going on with [name] today. He is in a wheelchair which is new for him, but he has good strength and sensation of his legs. Will check Bmet today since he has a history of hypokalemia. Toradol shot given in clinic. Will prescribe Mobic po daily for now, although last Creat was at 0.9 so may need to limit NSAIDs in the long term. Referred to PT for strengthening exercises; I am unsure if he is already getting home health PT. I think an ALF vs SNF may be beneficial to keep him focused as well as keep him mobile. Follow up with [name] next week.

## 2012-12-08 NOTE — Telephone Encounter (Signed)
Patient dropped off form to be filled out for scat.  Please fax when completed.

## 2012-12-08 NOTE — Telephone Encounter (Signed)
Placed in PCP box for completion 

## 2012-12-08 NOTE — Progress Notes (Signed)
Patient ID: Patrick Holt, male   DOB: Sep 22, 1950, 62 y.o.   MRN: [id number]  [facility] Amber M. Hairford, MD Phone: [phone]   Subjective: HPI: Patient is a 62 y.o. male presenting to clinic today for same day appointment for leg pain.  Patient has complex medical history including dermatomyositis, FM and generalized pain in addition to psychiatric illness. He is presenting with progressively worsening leg pain. He states he has disc disease in his back that is getting worse and that is contributing to his current situation. He is in a wheelchair today although he typically does not use one. He states he was able to walk yesterday with his walker, but the pain is so severe today he has to be in a wheelchair. He denies weakness and numbness. Patient tends to ramble so I asked him directly what he was hoping to get out of this visit, and he states "I don't know."  He is still on Methotrexate. States he is no longer on narcotics since [name] took him off and last Rx that he was given did not work. He has not changed any other medications  History Reviewed: Every day smoker.  ROS: Please see HPI above.  Objective: Office vital signs reviewed. BP 119/78  Pulse 118  Temp(Src) 98.4 F (36.9 C) (Oral)  Physical Examination:  General: Awake, alert. Talkative HEENT: Atraumatic, normocephalic. Rather dry lips Pulm: CTAB, no wheezes Cardio: RRR, no murmurs appreciated Abdomen: soft, nontender, nondistended Back: No TTP of spine Extremities: +edema. Endorses pain around knees, but good strength bilaterally. Able to go from sitting to standing from wheelchair independently. Psych: Some eye contact. Tangential thoughts, easily redirected  Assessment: 62 y.o. male same day for leg pain  Plan: See Problem List and After Visit Summary

## 2012-12-11 NOTE — Telephone Encounter (Signed)
Patient calls today, would like paperwork mailed to [address] [place], [place] [address].

## 2012-12-13 NOTE — Telephone Encounter (Signed)
Completed and given back to [facility]

## 2012-12-13 NOTE — Telephone Encounter (Signed)
Patient calls again, inquiring status of [facility] paperwork. Patient urges the paperwork to be completed as soon as possible. If paperwork has not been returned by 11/3, patient transportation will be cut off and he will not have a way to his appt. Please call patient once completed.

## 2012-12-14 ENCOUNTER — Ambulatory Visit: Payer: Self-pay | Admitting: Family Medicine

## 2012-12-14 ENCOUNTER — Telehealth: Payer: Self-pay | Admitting: Sports Medicine

## 2012-12-14 NOTE — Telephone Encounter (Signed)
LVM informing that forms have been faxed out today

## 2012-12-14 NOTE — Telephone Encounter (Signed)
LVM informing that forms were faxed today

## 2012-12-14 NOTE — Telephone Encounter (Signed)
Pt called to see if we faxed the forms yet for scat. Jw

## 2012-12-14 NOTE — Telephone Encounter (Signed)
Patient informed. 

## 2012-12-18 ENCOUNTER — Ambulatory Visit: Payer: Self-pay

## 2012-12-22 ENCOUNTER — Ambulatory Visit (INDEPENDENT_AMBULATORY_CARE_PROVIDER_SITE_OTHER): Payer: Medicare Other | Admitting: Sports Medicine

## 2012-12-28 ENCOUNTER — Other Ambulatory Visit: Payer: Self-pay

## 2012-12-28 ENCOUNTER — Encounter (HOSPITAL_COMMUNITY): Payer: Self-pay | Admitting: Emergency Medicine

## 2012-12-28 ENCOUNTER — Ambulatory Visit (HOSPITAL_COMMUNITY)
Admission: RE | Admit: 2012-12-28 | Discharge: 2012-12-28 | Disposition: A | Discharge status: Home / Self Care | Payer: Medicare HMO | Source: Ambulatory Visit | Attending: Sports Medicine | Admitting: Sports Medicine

## 2012-12-28 ENCOUNTER — Emergency Department (HOSPITAL_COMMUNITY): Payer: Medicare HMO

## 2012-12-28 ENCOUNTER — Inpatient Hospital Stay (HOSPITAL_COMMUNITY)
Admission: EM | Admit: 2012-12-28 | Discharge: 2012-12-29 | DRG: 310 | Disposition: A | Discharge status: Home / Self Care | Payer: Medicare HMO | Attending: Family Medicine | Admitting: Family Medicine

## 2012-12-28 ENCOUNTER — Ambulatory Visit (INDEPENDENT_AMBULATORY_CARE_PROVIDER_SITE_OTHER): Payer: Medicare HMO | Admitting: Sports Medicine

## 2012-12-28 VITALS — BP 174/97 | HR 60 | Ht 64.0 in

## 2012-12-28 DIAGNOSIS — K219 Gastro-esophageal reflux disease without esophagitis: Secondary | ICD-10-CM | POA: Diagnosis present

## 2012-12-28 DIAGNOSIS — E119 Type 2 diabetes mellitus without complications: Secondary | ICD-10-CM | POA: Diagnosis present

## 2012-12-28 DIAGNOSIS — IMO0001 Reserved for inherently not codable concepts without codable children: Secondary | ICD-10-CM | POA: Diagnosis present

## 2012-12-28 DIAGNOSIS — D649 Anemia, unspecified: Secondary | ICD-10-CM | POA: Diagnosis present

## 2012-12-28 DIAGNOSIS — F0390 Unspecified dementia without behavioral disturbance: Secondary | ICD-10-CM | POA: Diagnosis present

## 2012-12-28 DIAGNOSIS — J4489 Other specified chronic obstructive pulmonary disease: Secondary | ICD-10-CM | POA: Diagnosis present

## 2012-12-28 DIAGNOSIS — F17200 Nicotine dependence, unspecified, uncomplicated: Secondary | ICD-10-CM

## 2012-12-28 DIAGNOSIS — G40909 Epilepsy, unspecified, not intractable, without status epilepticus: Secondary | ICD-10-CM | POA: Diagnosis present

## 2012-12-28 DIAGNOSIS — Z886 Allergy status to analgesic agent status: Secondary | ICD-10-CM

## 2012-12-28 DIAGNOSIS — G609 Hereditary and idiopathic neuropathy, unspecified: Secondary | ICD-10-CM | POA: Diagnosis present

## 2012-12-28 DIAGNOSIS — R350 Frequency of micturition: Secondary | ICD-10-CM | POA: Diagnosis present

## 2012-12-28 DIAGNOSIS — Z880 Allergy status to penicillin: Secondary | ICD-10-CM

## 2012-12-28 DIAGNOSIS — I252 Old myocardial infarction: Secondary | ICD-10-CM

## 2012-12-28 DIAGNOSIS — R079 Chest pain, unspecified: Secondary | ICD-10-CM

## 2012-12-28 DIAGNOSIS — Z7982 Long term (current) use of aspirin: Secondary | ICD-10-CM

## 2012-12-28 DIAGNOSIS — K589 Irritable bowel syndrome without diarrhea: Secondary | ICD-10-CM | POA: Diagnosis present

## 2012-12-28 DIAGNOSIS — I2510 Atherosclerotic heart disease of native coronary artery without angina pectoris: Secondary | ICD-10-CM

## 2012-12-28 DIAGNOSIS — D869 Sarcoidosis, unspecified: Secondary | ICD-10-CM | POA: Diagnosis present

## 2012-12-28 DIAGNOSIS — J449 Chronic obstructive pulmonary disease, unspecified: Secondary | ICD-10-CM | POA: Diagnosis present

## 2012-12-28 DIAGNOSIS — Z883 Allergy status to other anti-infective agents status: Secondary | ICD-10-CM

## 2012-12-28 DIAGNOSIS — F411 Generalized anxiety disorder: Secondary | ICD-10-CM | POA: Diagnosis present

## 2012-12-28 DIAGNOSIS — Z885 Allergy status to narcotic agent status: Secondary | ICD-10-CM

## 2012-12-28 DIAGNOSIS — E876 Hypokalemia: Secondary | ICD-10-CM | POA: Diagnosis present

## 2012-12-28 DIAGNOSIS — L259 Unspecified contact dermatitis, unspecified cause: Secondary | ICD-10-CM | POA: Diagnosis present

## 2012-12-28 DIAGNOSIS — F1020 Alcohol dependence, uncomplicated: Secondary | ICD-10-CM | POA: Diagnosis present

## 2012-12-28 DIAGNOSIS — I4891 Unspecified atrial fibrillation: Principal | ICD-10-CM | POA: Diagnosis present

## 2012-12-28 DIAGNOSIS — I259 Chronic ischemic heart disease, unspecified: Secondary | ICD-10-CM | POA: Diagnosis present

## 2012-12-28 DIAGNOSIS — Z888 Allergy status to other drugs, medicaments and biological substances status: Secondary | ICD-10-CM

## 2012-12-28 DIAGNOSIS — Z881 Allergy status to other antibiotic agents status: Secondary | ICD-10-CM

## 2012-12-28 DIAGNOSIS — E785 Hyperlipidemia, unspecified: Secondary | ICD-10-CM | POA: Diagnosis present

## 2012-12-28 DIAGNOSIS — I10 Essential (primary) hypertension: Secondary | ICD-10-CM | POA: Diagnosis present

## 2012-12-28 DIAGNOSIS — E860 Dehydration: Secondary | ICD-10-CM | POA: Diagnosis present

## 2012-12-28 DIAGNOSIS — F259 Schizoaffective disorder, unspecified: Secondary | ICD-10-CM | POA: Diagnosis present

## 2012-12-28 DIAGNOSIS — F319 Bipolar disorder, unspecified: Secondary | ICD-10-CM | POA: Diagnosis present

## 2012-12-28 DIAGNOSIS — Z8669 Personal history of other diseases of the nervous system and sense organs: Secondary | ICD-10-CM

## 2012-12-28 DIAGNOSIS — Z79899 Other long term (current) drug therapy: Secondary | ICD-10-CM

## 2012-12-28 DIAGNOSIS — F429 Obsessive-compulsive disorder, unspecified: Secondary | ICD-10-CM | POA: Diagnosis present

## 2012-12-28 DIAGNOSIS — M069 Rheumatoid arthritis, unspecified: Secondary | ICD-10-CM | POA: Diagnosis present

## 2012-12-28 DIAGNOSIS — Z882 Allergy status to sulfonamides status: Secondary | ICD-10-CM

## 2012-12-28 DIAGNOSIS — Z609 Problem related to social environment, unspecified: Secondary | ICD-10-CM

## 2012-12-28 LAB — MAGNESIUM: Magnesium: 2 mg/dL (ref 1.5–2.5)

## 2012-12-28 LAB — GLUCOSE, CAPILLARY: Glucose-Capillary: 118 mg/dL — ABNORMAL HIGH (ref 70–99)

## 2012-12-28 LAB — HEPATIC FUNCTION PANEL
AST: 13 U/L (ref 0–37)
Albumin: 3.3 g/dL — ABNORMAL LOW (ref 3.5–5.2)
Indirect Bilirubin: 0.5 mg/dL (ref 0.3–0.9)
Total Bilirubin: 0.6 mg/dL (ref 0.3–1.2)
Total Protein: 6.8 g/dL (ref 6.0–8.3)

## 2012-12-28 LAB — BASIC METABOLIC PANEL
BUN: 15 mg/dL (ref 6–23)
Chloride: 106 mEq/L (ref 96–112)
Creatinine, Ser: 0.94 mg/dL (ref 0.50–1.35)
GFR calc Af Amer: >90 mL/min (ref >90)
GFR calc non Af Amer: 87 mL/min — ABNORMAL LOW (ref >90)
Glucose, Bld: 83 mg/dL (ref 70–99)
Potassium: 3.3 mEq/L — ABNORMAL LOW (ref 3.5–5.1)

## 2012-12-28 LAB — CBC
HCT: 41.3 % (ref 39.0–52.0)
Hemoglobin: 13.6 g/dL (ref 13.0–17.0)
MCHC: 32.9 g/dL (ref 30.0–36.0)
Platelets: 189 10*3/uL (ref 150–400)
RBC: 4.21 MIL/uL — ABNORMAL LOW (ref 4.22–5.81)

## 2012-12-28 LAB — POCT I-STAT TROPONIN I: Troponin i, poc: 0 ng/mL (ref 0.00–0.08)

## 2012-12-28 LAB — MRSA PCR SCREENING: MRSA by PCR: NEGATIVE

## 2012-12-28 LAB — TROPONIN I: Troponin I: <0.3 ng/mL (ref <0.30)

## 2012-12-28 LAB — PRO B NATRIURETIC PEPTIDE: Pro B Natriuretic peptide (BNP): 211.8 pg/mL — ABNORMAL HIGH (ref 0–125)

## 2012-12-28 MED ORDER — NITROGLYCERIN 0.4 MG SL SUBL
0.4000 mg | SUBLINGUAL_TABLET | SUBLINGUAL | Status: AC | PRN
  Administered 2012-12-28 (×3): 0.4 mg via SUBLINGUAL

## 2012-12-28 MED ORDER — SODIUM CHLORIDE 0.9 % IV SOLN
INTRAVENOUS | Status: AC
  Administered 2012-12-28: 19:00:00 via INTRAVENOUS

## 2012-12-28 MED ORDER — OXYBUTYNIN CHLORIDE 5 MG PO TABS
5.0000 mg | ORAL_TABLET | Freq: Every day | ORAL | Status: DC
  Administered 2012-12-28 – 2012-12-29 (×2): 5 mg via ORAL
  Filled 2012-12-28 (×2): qty 1

## 2012-12-28 MED ORDER — LEVETIRACETAM 500 MG PO TABS
500.0000 mg | ORAL_TABLET | Freq: Every day | ORAL | Status: DC
  Administered 2012-12-28 – 2012-12-29 (×2): 500 mg via ORAL
  Filled 2012-12-28 (×2): qty 1

## 2012-12-28 MED ORDER — RISPERIDONE 1 MG PO TBDP
1.0000 mg | ORAL_TABLET | Freq: Every day | ORAL | Status: DC
  Administered 2012-12-28 – 2012-12-29 (×2): 1 mg via ORAL
  Filled 2012-12-28 (×2): qty 1

## 2012-12-28 MED ORDER — ALPRAZOLAM 0.5 MG PO TABS: 1.0000 mg | ORAL_TABLET | Freq: Every evening | ORAL | Status: DC | PRN

## 2012-12-28 MED ORDER — IPRATROPIUM-ALBUTEROL 20-100 MCG/ACT IN AERS
1.0000 | INHALATION_SPRAY | Freq: Four times a day (QID) | RESPIRATORY_TRACT | Status: DC | PRN
  Filled 2012-12-28: qty 4

## 2012-12-28 MED ORDER — TORSEMIDE 20 MG PO TABS
20.0000 mg | ORAL_TABLET | Freq: Every day | ORAL | Status: DC
  Administered 2012-12-28 – 2012-12-29 (×2): 20 mg via ORAL
  Filled 2012-12-28 (×2): qty 1

## 2012-12-28 MED ORDER — ASPIRIN 325 MG PO TABS
325.0000 mg | ORAL_TABLET | Freq: Every day | ORAL | Status: DC
  Administered 2012-12-28: 325 mg via ORAL

## 2012-12-28 MED ORDER — MORPHINE SULFATE 4 MG/ML IJ SOLN: 4.0000 mg | INTRAMUSCULAR | Status: DC | PRN

## 2012-12-28 MED ORDER — ASPIRIN EC 81 MG PO TBEC
81.0000 mg | DELAYED_RELEASE_TABLET | Freq: Every day | ORAL | Status: DC
  Administered 2012-12-29: 81 mg via ORAL
  Filled 2012-12-28: qty 1

## 2012-12-28 MED ORDER — POTASSIUM CHLORIDE CRYS ER 20 MEQ PO TBCR
40.0000 meq | EXTENDED_RELEASE_TABLET | Freq: Once | ORAL | Status: AC
  Administered 2012-12-28: 40 meq via ORAL
  Filled 2012-12-28: qty 2

## 2012-12-28 MED ORDER — ONDANSETRON HCL 4 MG/2ML IJ SOLN: 4.0000 mg | Freq: Four times a day (QID) | INTRAMUSCULAR | Status: DC | PRN

## 2012-12-28 MED ORDER — ACETAMINOPHEN 325 MG PO TABS: 650.0000 mg | ORAL_TABLET | ORAL | Status: DC | PRN

## 2012-12-28 MED ORDER — SODIUM CHLORIDE 0.9 % IV SOLN
INTRAVENOUS | Status: DC
  Administered 2012-12-28: 20 mL/h via INTRAVENOUS

## 2012-12-28 MED ORDER — HEPARIN SODIUM (PORCINE) 5000 UNIT/ML IJ SOLN
5000.0000 [IU] | Freq: Three times a day (TID) | INTRAMUSCULAR | Status: DC
  Administered 2012-12-28 – 2012-12-29 (×2): 5000 [IU] via SUBCUTANEOUS
  Filled 2012-12-28 (×5): qty 1

## 2012-12-28 MED ORDER — AMIODARONE HCL 200 MG PO TABS
200.0000 mg | ORAL_TABLET | Freq: Every day | ORAL | Status: DC
  Administered 2012-12-28 – 2012-12-29 (×2): 200 mg via ORAL
  Filled 2012-12-28 (×2): qty 1

## 2012-12-28 MED ORDER — NITROGLYCERIN 0.4 MG SL SUBL
0.4000 mg | SUBLINGUAL_TABLET | Freq: Once | SUBLINGUAL | Status: AC
  Administered 2012-12-28: 0.4 mg via SUBLINGUAL

## 2012-12-28 MED ORDER — ROPINIROLE HCL 1 MG PO TABS
2.0000 mg | ORAL_TABLET | Freq: Every day | ORAL | Status: DC
  Administered 2012-12-28 – 2012-12-29 (×2): 2 mg via ORAL
  Filled 2012-12-28 (×2): qty 2

## 2012-12-28 MED ORDER — NITROGLYCERIN 0.4 MG SL SUBL: 0.4000 mg | SUBLINGUAL_TABLET | SUBLINGUAL | Status: DC | PRN

## 2012-12-28 MED ORDER — PANTOPRAZOLE SODIUM 40 MG PO TBEC
80.0000 mg | DELAYED_RELEASE_TABLET | Freq: Every day | ORAL | Status: DC
  Administered 2012-12-29: 80 mg via ORAL
  Filled 2012-12-28: qty 2

## 2012-12-28 NOTE — H&P (Signed)
Attending Addendum  I examined the patient and discussed the assessment and plan with Dr. Pollie Meyer. I have reviewed the note and agree.  Patient with CP this AM while awaiting appt. Initial EKG revealed A fib, repeat revealed bradycardia. He is now pain free. Has known paroxysmal a fib not on anticoagulation due to history of intracranial bleed.   BP 155/78  Pulse 54  Temp(Src) 98.5 F (36.9 C) (Oral)  Resp 16  Ht 5\' 3"  (1.6 m)  Wt 210 lb 12.2 oz (95.6 kg)  BMI 37.34 kg/m2  SpO2 100% General appearance: alert and cooperative, no distress Chest: S1S2. RRR CTA b/l. No carotid bruit.   62 yo M smoker admitted with CP, no ischemic changes on EKG.   Plan for CP rule out.  Low suspicion for ACS, so no heparin gtt. Continue ASA for antiplatelet therapy.       Dessa Phi, MD FAMILY MEDICINE TEACHING SERVICE

## 2012-12-28 NOTE — Progress Notes (Signed)
Admitted from the ED awake and alert. Denied any chest pain at this time. Continue to monitor.

## 2012-12-28 NOTE — Progress Notes (Signed)
[facility] Patrick Holt - 62 y.o. male MRN [id number]  Date of birth: 07-02-1950  CC, HPI, Interval History & ROS  Patrick Holt is here today to followup on his chronic medical conditions including:    He reports acute onset of chest pain while waiting in the lobby.  He reports left-sided chest pressure that radiates into his left shoulder and halfway down his left arm.  He rates it as a severe pain.  No vomiting, or diaphoresis.  He also reports a history of daily walking approximately 1 mile.  He has been doing this without difficulty up until yesterday when he had to be picked up due to his arms and legs "giving out on him" and feeling of overwhelming fatigue.  He has not taken his medications this morning due to being picked up early from [facility].  He has a significant history including diabetes, hyperlipidemia, known coronary artery disease, dermatomyositis, rheumatoid arthritis, fibromyalgia, generalized pain, psychotic disorder not well characterized, recurrent hypokalemia, recurrent hospitalizations for psychiatric causes.  He is a current every day smoker and reports that he quit smoking he will die.    Pertinent History & Care Coordination   History  Smoking status  . Current Every Day Smoker -- 0.50 packs/day for 30 years  . Types: Cigarettes  Smokeless tobacco  . Never Used   Health Maintenance Due  Topic  . Zostavax     Recent Labs  02/03/12 1819  05/26/12 1433 06/26/12 1418 10/18/12 0400 11/09/12 1035 11/20/12 1518  HGBA1C 5.5  --  4.8  --   --   --  5.3  TSH  --   < >  --  0.41 0.752 0.708  --   < > = values in this interval not displayed.   Otherwise past Medical, Surgical, Social, and Family History Reviewed per EMR Medications and Allergies reviewed and all updated if necessary. Objective Findings  VITALS: HR: 60 bpm  BP: 174/97 mmHg  TEMP:   ( )  RESP:    HT: 5\' 4"  (162.6 cm)  WT:    BMI:     BP Readings from Last 3  Encounters:  12/28/12 174/97  12/08/12 119/78  11/20/12 115/66   Wt Readings from Last 3 Encounters:  11/20/12 193 lb (87.544 kg)  11/09/12 181 lb 4.8 oz (82.237 kg)  10/17/12 188 lb 15 oz (85.7 kg)     PHYSICAL EXAM: GENERAL:  adult Caucasian appearance older than her actual age male.  Emotionally labile, no respiratory distress, tremulous   PSYCH: alert and appropriate, good insight   HNEENT:  no jvd, poor dention  CARDIO:  bradycardic, pain is not reproducible to palpation   LUNGS: CTA B, no wheezes, no crackles  ABDOMEN:   EXTREM:   extremities are tremulous,, distal pulses intact, minimal swelling  GU:   SKIN:    EKG: Sinus bradycardia at 52 beats per minute, no ST elevation, no evidence of active ischemia,  Assessment & Plan   Problems addressed today: General Plan & Pt Instructions:  No diagnosis found.    Patient to emergency department     For further discussion of A/P and for follow up issues see problem based charting.

## 2012-12-28 NOTE — ED Notes (Signed)
Pt was at family practice. 1045 received aspirin and 3 nitro and is now chest pain free.

## 2012-12-28 NOTE — ED Notes (Addendum)
Pt transported to 2H with home walker, pant, shirt,pair of shoe, cell phone, coat  and wallet. Upper denture with patient.

## 2012-12-28 NOTE — Addendum Note (Signed)
Addended by: Altamese Dilling A on: 12/28/2012 11:15 AM   Modules accepted: Orders

## 2012-12-28 NOTE — ED Notes (Signed)
Pt reports that he is having chest pain again; EKG repeated and shown to MD. Pt now bradycardic.

## 2012-12-28 NOTE — ED Notes (Addendum)
MD at bedside. Step down bed will be requested since pt in active chest pain.

## 2012-12-28 NOTE — ED Provider Notes (Signed)
Medical screening examination/treatment/procedure(s) were conducted as a shared visit with non-physician practitioner(s) and myself.  I personally evaluated the patient during the encounter.  62yo M, c/o left sided chest pain while at his PMD's ofc PTA. Describes as "pressure," radiates into his left shoulder and down his left arm. Has been associated with recent generalized fatigue. Pt was given ASA and SL ntg with improvement in his symptoms. Pt endorses he did not take his usual morning meds today. Initial EKG with afib, now NSR. VSS, A&O, CTA, RRR, abd soft/NT. Pt has multiple ACS risk factors; will admit. T/C to [facility] resident, case discussed, including:  HPI, pertinent PM/SHx, VS/PE, dx testing, ED course and treatment:  Agreeable to admit, requests to write temporary orders, obtain tele bed to Dr. Armen Pickup.    Date: 12/28/2012 on arrival  Rate: 108  Rhythm: atrial fibrillation  QRS Axis: normal  Intervals: normal  ST/T Wave abnormalities: nonspecific ST/T changes, diffuse  Conduction Disutrbances:none  Narrative Interpretation:   Old EKG Reviewed: changes noted; previous EKG dated 11/15/2012 was NSR.   Date: 12/28/2012 repeat  Rate: 63  Rhythm: normal sinus rhythm  QRS Axis: normal  Intervals: PR prolonged  ST/T Wave abnormalities: normal, diffuse NS STTW changes improved  Conduction Disutrbances:first-degree A-V block   Narrative Interpretation:   Old EKG Reviewed: changes noted, 1st degree AVB new since previous EKG.   Results for orders placed during the hospital encounter of 12/28/12  BASIC METABOLIC PANEL      Result Value Range   Sodium 145  135 - 145 mEq/L   Potassium 3.3 (*) 3.5 - 5.1 mEq/L   Chloride 106  96 - 112 mEq/L   CO2 31  19 - 32 mEq/L   Glucose, Bld 83  70 - 99 mg/dL   BUN 15  6 - 23 mg/dL   Creatinine, Ser 1.61  0.50 - 1.35 mg/dL   Calcium 8.7  8.4 - 09.6 mg/dL   GFR calc non Af Amer 87 (*) >90 mL/min   GFR calc Af Amer >90  >90 mL/min  CBC      Result  Value Range   WBC 5.1  4.0 - 10.5 K/uL   RBC 4.21 (*) 4.22 - 5.81 MIL/uL   Hemoglobin 13.6  13.0 - 17.0 g/dL   HCT 04.5  40.9 - 81.1 %   MCV 98.1  78.0 - 100.0 fL   MCH 32.3  26.0 - 34.0 pg   MCHC 32.9  30.0 - 36.0 g/dL   RDW 91.4 (*) 78.2 - 95.6 %   Platelets 189  150 - 400 K/uL  MAGNESIUM      Result Value Range   Magnesium 2.0  1.5 - 2.5 mg/dL  PRO B NATRIURETIC PEPTIDE      Result Value Range   Pro B Natriuretic peptide (BNP) 211.8 (*) 0 - 125 pg/mL  VALPROIC ACID LEVEL      Result Value Range   Valproic Acid Lvl 28.6 (*) 50.0 - 100.0 ug/mL  HEPATIC FUNCTION PANEL      Result Value Range   Total Protein 6.8  6.0 - 8.3 g/dL   Albumin 3.3 (*) 3.5 - 5.2 g/dL   AST 13  0 - 37 U/L   ALT 7  0 - 53 U/L   Alkaline Phosphatase 63  39 - 117 U/L   Total Bilirubin 0.6  0.3 - 1.2 mg/dL   Bilirubin, Direct 0.1  0.0 - 0.3 mg/dL   Indirect Bilirubin 0.5  0.3 -  0.9 mg/dL  TROPONIN I      Result Value Range   Troponin I <0.30  <0.30 ng/mL  POCT I-STAT TROPONIN I      Result Value Range   Troponin i, poc 0.00  0.00 - 0.08 ng/mL   Comment 3            Dg Chest Port 1 View 12/28/2012   CLINICAL DATA:  Chest pain.  Atrial fibrillation.  EXAM: PORTABLE CHEST - 1 VIEW  COMPARISON:  11/08/2012  FINDINGS: There is slight new pulmonary vascular congestion. Minimal linear atelectasis at the left base. No effusions or lung consolidation. No acute osseous abnormality.  IMPRESSION: New pulmonary vascular congestion.  Minimal left base atelectasis.   Electronically Signed   By: Geanie Cooley M.D.   On: 12/28/2012 12:27          Laray Anger, DO 12/28/12 1427

## 2012-12-28 NOTE — ED Provider Notes (Signed)
CSN: [id number]     Arrival date & time 12/28/12  1116 History   First MD Initiated Contact with Patient 12/28/12 1131     Chief Complaint  Patient presents with  . Chest Pain   (Consider location/radiation/quality/duration/timing/severity/associated sxs/prior Treatment) HPI Comments: The patient is a 62 year-old male with a past medical history of COPD, CAD, A-fib, presenting the Emergency Department with a chief complaint of Left sided chest pain.  He reports a sudden onset of sharp chest pain while in the waiting room at his PCP office today.  He states the chest pain lasted 20 minutes and was relieved by ASA and nitro.  He reports pain radiated into his Left upper extremity and across to his right chest.  He denies pleuritic pain. He reports he was hospitalized in October and Dr. Tenny Craw is his cardiologist.  He reports an increase in dyspnea on exertion recently.  Denies diaphoresis, palpitations, nausea, cough, vomiting, diarrhea, or constipation, fever or chills. He was transported to the ED where he denies current chest pain.  He also reports he was unable to take his morning medication due to his ride for his doctors appointment being early.     Patient is a 62 y.o. male presenting with chest pain. The history is provided by the patient and the EMS personnel.  Chest Pain   Past Medical History  Diagnosis Date  . Rheumatoid arthritis(714.0)   . Obesity   . Major depression     with acute psychotic break in 06/2010  . Hypertension   . Hyperlipidemia   . Diverticulosis of colon   . COPD (chronic obstructive pulmonary disease)   . Anxiety   . GERD (gastroesophageal reflux disease)   . Vertigo   . Fibromyalgia   . Dermatomyositis   . Myocardial infarct     mulitple (1999, 2000, 2004)  . Raynaud's disease   . Narcotic dependence   . Peripheral neuropathy   . Internal hemorrhoids   . Ischemic heart disease   . Hiatal hernia   . Gastritis   . Diverticulitis   . Hx of  adenomatous colonic polyps   . Nephrolithiasis   . Anemia   . Esophageal stricture   . Esophageal dysmotility   . Dermatomyositis   . Urge incontinence   . Otosclerosis   . Bipolar 1 disorder   . OCD (obsessive compulsive disorder)   . Sarcoidosis   . Paroxysmal a-fib   . Dysrhythmia     "irregular" (11/15/2012)  . Type II diabetes mellitus   . Seizures   . Headache(784.0)     "severe; get them often" (11/15/2012)   Past Surgical History  Procedure Laterality Date  . Knee arthroscopy w/ meniscal repair Left 2009  . Lumbar disc surgery    . Squamous papilloma   2010    removed by Dr. Pollyann Kennedy ENT, noted on tongue  . Esophagogastroduodenoscopy N/A 09/27/2012    Procedure: ESOPHAGOGASTRODUODENOSCOPY (EGD);  Surgeon: Hart Carwin, MD;  Location: [facility];  Service: Endoscopy;  Laterality: N/A;  . Colonoscopy N/A 09/27/2012    Procedure: COLONOSCOPY;  Surgeon: Hart Carwin, MD;  Location: [facility];  Service: Endoscopy;  Laterality: N/A;  . Cataract extraction w/ intraocular lens implant Left   . Lymph node dissection Right 1970's    "neck; dr thought I had Hodgkins; turned out to be sarcoidosis" (11/15/2012)  . Tonsillectomy    . Carpal tunnel release Bilateral   . Cardiac catheterization     Family  History  Problem Relation Age of Onset  . Alcohol abuse Mother   . Depression Mother   . Heart disease Mother   . Diabetes Mother   . Stroke Mother   . Diabetes Other     1/2 brother  . Hepatitis Brother    History  Substance Use Topics  . Smoking status: Current Every Day Smoker -- 0.50 packs/day for 30 years    Types: Cigarettes  . Smokeless tobacco: Never Used  . Alcohol Use: 183.0 oz/week    305 Glasses of wine per week     Comment: 11/15/2012 "1/2 glass of red wine ~ qd"    Review of Systems  All other systems reviewed and are negative.    Allergies  Betamethasone dipropionate; Bupropion hcl; Ciprofloxacin; Clobetasol; Codeine; Escitalopram oxalate; Flagyl;  Fluoxetine hcl; Fluticasone-salmeterol; Furosemide; Immune globulins; Paroxetine; Penicillins; Statins; Sulfa antibiotics; Tacrolimus; Tetanus toxoid; and Tuberculin purified protein derivative  Home Medications   No current outpatient prescriptions on file. BP 155/78  Pulse 54  Temp(Src) 98.5 F (36.9 C) (Oral)  Resp 16  Ht 5\' 3"  (1.6 m)  Wt 210 lb 12.2 oz (95.6 kg)  BMI 37.34 kg/m2  SpO2 100% Physical Exam  Nursing note and vitals reviewed. Constitutional: He appears well-developed and well-nourished. No distress.  Patient appears anxious  Neck: No JVD present.  Cardiovascular: An irregularly irregular rhythm present.  Heart rate 70's-120's during exam.  1+ pitting edema bilateral lower extremities.   Pulmonary/Chest: Effort normal. No accessory muscle usage. Not tachypneic. No respiratory distress. He has no decreased breath sounds. He has no wheezes. He has no rhonchi. He has no rales.  Abdominal: Soft. Bowel sounds are normal. He exhibits no distension. There is tenderness in the epigastric area. There is no rebound and no guarding.  Mild epigastric pain  Neurological: He is alert.  Skin: Skin is warm and dry.    ED Course  Procedures (including critical care time) Labs Review Labs Reviewed  BASIC METABOLIC PANEL - Abnormal; Notable for the following:    Potassium 3.3 (*)    GFR calc non Af Amer 87 (*)    All other components within normal limits  CBC - Abnormal; Notable for the following:    RBC 4.21 (*)    RDW 18.3 (*)    All other components within normal limits  PRO B NATRIURETIC PEPTIDE - Abnormal; Notable for the following:    Pro B Natriuretic peptide (BNP) 211.8 (*)    All other components within normal limits  VALPROIC ACID LEVEL - Abnormal; Notable for the following:    Valproic Acid Lvl 28.6 (*)    All other components within normal limits  HEPATIC FUNCTION PANEL - Abnormal; Notable for the following:    Albumin 3.3 (*)    All other components within  normal limits  GLUCOSE, CAPILLARY - Abnormal; Notable for the following:    Glucose-Capillary 118 (*)    All other components within normal limits  MRSA PCR SCREENING  MAGNESIUM  TROPONIN I  TROPONIN I  TROPONIN I  TROPONIN I  LIPID PANEL  CBC  BASIC METABOLIC PANEL  POCT I-STAT TROPONIN I   Imaging Review Dg Chest Port 1 View  12/28/2012   CLINICAL DATA:  Chest pain.  Atrial fibrillation.  EXAM: PORTABLE CHEST - 1 VIEW  COMPARISON:  11/08/2012  FINDINGS: There is slight new pulmonary vascular congestion. Minimal linear atelectasis at the left base. No effusions or lung consolidation. No acute osseous abnormality.  IMPRESSION:  New pulmonary vascular congestion.  Minimal left base atelectasis.   Electronically Signed   By: Geanie Cooley M.D.   On: 12/28/2012 12:27   Date: 12/28/2012  TIME: 1131  Rate: 108  Rhythm: atrial fibrillation and atrial flutter  QRS Axis: normal  Intervals: normal  ST/T Wave abnormalities: ST depressions diffusely  Conduction Disutrbances:none  Narrative Interpretation:  Anteroseptal infarct, old  Old EKG Reviewed: changes noted   Date: 12/28/2012 TIME 1416  Rate: 63  Rhythm: normal sinus rhythm  QRS Axis: normal  Intervals: normal  ST/T Wave abnormalities: normal  Conduction Disutrbances:none  Narrative Interpretation: Probable anteroseptal infarct, old  Old EKG Reviewed: changes noted    MDM   1. Chest pain    Patient presents with an acute onset of chest pain with radiation to UE. The patient has a significant cardiac history.  Recent Echo 93/2014. Labs and imaging ordered.   Pharmacy reconciliation reveals that patient had been taking more than prescribed amount of Depakote.  Depakote level ordered.  Liver function ordered.  EMR reveals: Transthoracic Echocardiography Study Date: 10/18/2012 - Left ventricle: The cavity size was normal. Wall thickness was increased in a pattern of mild LVH. Systolic function was normal. The estimated  ejection fraction was in the range of 60% to 65%. Wall motion was normal; there were no regional wall motion abnormalities. - Aortic valve: There was mild stenosis. Valve area: 1.44cm^2(VTI). Valve area: 1.29cm^2 (Vmax). - Left atrium: The atrium was moderately dilated. - Tricuspid valve: Moderate regurgitation. - Pulmonary arteries: PA peak pressure: 34mm Hg (S).  Discussed patient history and condition with Dr. Clarene Duke, after her evaluation of the patient she will consult for admission.   Meds given in ED:  Medications  morphine 4 MG/ML injection 4 mg (not administered)  0.9 %  sodium chloride infusion ( Intravenous New Bag/Given 12/28/12 1849)  ALPRAZolam (XANAX) tablet 1 mg (not administered)  levETIRAcetam (KEPPRA) tablet 500 mg (500 mg Oral Given 12/28/12 1940)  oxybutynin (DITROPAN) tablet 5 mg (5 mg Oral Given 12/28/12 1940)  risperiDONE (RISPERDAL M-TABS) disintegrating tablet 1 mg (1 mg Oral Given 12/28/12 1940)  rOPINIRole (REQUIP) tablet 2 mg (2 mg Oral Given 12/28/12 1940)  aspirin EC tablet 81 mg (not administered)  pantoprazole (PROTONIX) EC tablet 80 mg (not administered)  torsemide (DEMADEX) tablet 20 mg (20 mg Oral Given 12/28/12 1940)  amiodarone (PACERONE) tablet 200 mg (200 mg Oral Given 12/28/12 1940)  Ipratropium-Albuterol (COMBIVENT) respimat 1 puff (not administered)  nitroGLYCERIN (NITROSTAT) SL tablet 0.4 mg (not administered)  acetaminophen (TYLENOL) tablet 650 mg (not administered)  ondansetron (ZOFRAN) injection 4 mg (not administered)  heparin injection 5,000 Units (not administered)  nitroGLYCERIN (NITROSTAT) SL tablet 0.4 mg (0.4 mg Sublingual Given 12/28/12 1540)  potassium chloride SA (K-DUR,KLOR-CON) CR tablet 40 mEq (40 mEq Oral Given 12/28/12 1939)    Current Discharge Medication List        Clabe Seal, PA-C 12/28/12 2202

## 2012-12-28 NOTE — Assessment & Plan Note (Addendum)
The patient is experiencing acute onset of atypical chest pain rated as severe.  He has significant risk factors and known coronary artery disease.  Given this we will treat him with oxygen, nitroglycerin, baby aspirin. He has not taken any of his medications today and this may be exacerbating his symptoms but he does have a concerning story of worsening exercise tolerance as of yesterday. Due to his significant risk factors he will need to have an ACS evaluation.  Would likely benefit from seeing cardiology given his significant history;   I do not see a recent ischemic evaluation.  Most recent echo was 60-65%, grade 1 diastolic dysfunction, mild left atrial enlargement, mild aortic stenosis, mean gradient of 10.  His primary cardiologist is Dr. Tenny Craw.  He does have a history of atrial fibrillation but has not been an anticoagulation candidate due to a prior subdural bleed.  Dr. Deirdre Priest aware, I have let the ED providers know, transport via [facility]  >50% of this 25 minute visit spent in direct patient counseling and/or coordination of care.

## 2012-12-28 NOTE — H&P (Signed)
Family Medicine Teaching [facility] Admission History and Physical Service Pager: [phone]  Patient name: Patrick Holt Medical record number: [id number] Date of birth: 1950-04-04 Age: 62 y.o. Gender: male  Primary Care Provider: Gaspar Bidding, DO Consultants: cardiology  Code Status: full code (discussed with patient upon admission)  Chief Complaint: chest pain  Assessment and Plan: Patrick Holt is a 62 y.o. male presenting with chest pain . PMH is significant for CAD, several MI's, COPD, GERD, atrial fibrillation, among other problems.  # Chest pain: some atypical features, but with hx must be concerned about ACS. Thus far EKG's reassuring (ST depression resolved and was not in a pattern of localized ischemia) and troponin negative. - admit to stepdown unit as patient actively has chest pain - prn morphine & nitroglycerin - cycle troponins q6h x3 - EKG in AM - monitor on telemetry - consult cardiology  # Hypokalemia: replete PO  # Psych: continue home psych meds - unclear alcohol history, will order CIWA protocol in an abundance of caution  # GERD; continue PPI  # COPD: continue combivent prn  # Hx seizure disorder: continue keppra, pending pharmacy review regarding depakote dosing (has two different doses listed)  # Atrial fibrillation: initially with HR >100 in afib, now sinus rhythm, intermittently sinus bradycardia - per PCP's note, is not a candidate for anticoagulation due to prior subdural bleed  # Hyperlipidemia: - check lipid panel in AM - patient states he cannot afford his home lipid medication, but given his hx he should be on a high intensity statin  # Urinary frequency: - continue home oxybutynin - check UA  FEN/GI: heart healthy diet, SLIV Prophylaxis: SQ heparin  Disposition: pending ACS rule out and cardiology evaluation  History of Present Illness: Patrick Holt is a 62 y.o. male presenting with chest pain.  Earlier today he  was at the [facility] and began to have chest pain which radiated to his left arm, jaw and shoulder. He experienced numbness in his neck and jaw. He does not note it was worse with exertion because he did not want. He had mild nausea but no vomiting. Also endorses having had mild shortness of breath. Denies diaphoresis.  He does note increased urination, and swelling in his lower extremities in the evening. He denies any dysuria, or problems with his bowels.  When he arrived at the ER he was in atrial fibrillation with heart rate in the 110s. He has since spontaneously converted to sinus bradycardia. Per ER RN, pain seems to be worse with reversion to sinus rhythm.   He does not appear to had a recent stress test. Patient states that he has been working on exercise, and has been walking with a walker approximately 1 mile per day.  Review Of Systems: Per HPI otherwise review of systems was performed and was unremarkable.  Patient Active Problem List   Diagnosis Date Noted  . Pain in joint, lower leg 12/08/2012  . Dehydration 11/09/2012  . Contact dermatitis 11/09/2012  . Altered mental status 11/09/2012  . Hypokalemia 10/03/2012  . Benign neoplasm of colon 09/27/2012  . Stricture and stenosis of esophagus 09/27/2012  . Abdominal pain, left lower quadrant 09/26/2012  . Diverticulitis of colon without hemorrhage 07/26/2012  . Dystrophic nail 07/03/2012  . Polypharmacy 03/26/2012  . Thought disorder 07-05-2012    Class: Chronic  . 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 Psychotic disorder 02/14/2012  . Seizures 02/10/2012  .  Atrial fibrillation 02/05/2012  . Coronary atherosclerosis of native coronary artery 02/05/2012  . High risk social situation 10/15/2011  . Ear pain 03/10/2011  . Dental attrition, excessive 03/10/2011  . Radicular low back pain 03/09/2011  . History of seizure disorder 01/29/2011  . Irritable bowel syndrome (IBS) 06/24/2010   . Urge incontinence 06/19/2010  . TOBACCO USER 11/23/2008  . OBESITY, UNSPECIFIED 07/25/2008  . DEPRESSION, MAJOR 05/31/2008  . ISCHEMIC HEART DISEASE 05/31/2008  . RAYNAUD'S DISEASE 05/31/2008  . HIATAL HERNIA 05/31/2008  . Rheumatoid arthritis(714.0) 05/31/2008  . FIBROMYALGIA 05/31/2008  . Generalized pain 05/31/2008  . DIABETES MELLITUS, TYPE II 01/26/2008  . HYPERLIPIDEMIA 01/26/2008  . ANXIETY, CHRONIC 01/26/2008  . HYPERTENSION 01/26/2008  . HEMORRHOIDS, INTERNAL 01/26/2008  . COPD 01/26/2008  . ESOPHAGEAL MOTILITY DISORDER 01/26/2008  . GERD 01/26/2008  . DIVERTICULOSIS OF COLON 01/26/2008  . Dermatomyositis 01/26/2008   Past Medical History: Past Medical History  Diagnosis Date  . Rheumatoid arthritis(714.0)   . Obesity   . Major depression     with acute psychotic break in 06/2010  . Hypertension   . Hyperlipidemia   . Diverticulosis of colon   . COPD (chronic obstructive pulmonary disease)   . Anxiety   . GERD (gastroesophageal reflux disease)   . Vertigo   . Fibromyalgia   . Dermatomyositis   . Myocardial infarct     mulitple (1999, 2000, 2004)  . Raynaud's disease   . Narcotic dependence   . Peripheral neuropathy   . Internal hemorrhoids   . Ischemic heart disease   . Hiatal hernia   . Gastritis   . Diverticulitis   . Hx of adenomatous colonic polyps   . Nephrolithiasis   . Anemia   . Esophageal stricture   . Esophageal dysmotility   . Dermatomyositis   . Urge incontinence   . Otosclerosis   . Bipolar 1 disorder   . OCD (obsessive compulsive disorder)   . Sarcoidosis   . Paroxysmal a-fib   . Dysrhythmia     "irregular" (11/15/2012)  . Type II diabetes mellitus   . Seizures   . Headache(784.0)     "severe; get them often" (11/15/2012)   Past Surgical History: Past Surgical History  Procedure Laterality Date  . Knee arthroscopy w/ meniscal repair Left 2009  . Lumbar disc surgery    . Squamous papilloma   2010    removed by Dr. Pollyann Kennedy  ENT, noted on tongue  . Esophagogastroduodenoscopy N/A 09/27/2012    Procedure: ESOPHAGOGASTRODUODENOSCOPY (EGD);  Surgeon: Hart Carwin, MD;  Location: [facility];  Service: Endoscopy;  Laterality: N/A;  . Colonoscopy N/A 09/27/2012    Procedure: COLONOSCOPY;  Surgeon: Hart Carwin, MD;  Location: [facility];  Service: Endoscopy;  Laterality: N/A;  . Cataract extraction w/ intraocular lens implant Left   . Lymph node dissection Right 1970's    "neck; dr thought I had Hodgkins; turned out to be sarcoidosis" (11/15/2012)  . Tonsillectomy    . Carpal tunnel release Bilateral   . Cardiac catheterization     Social History: History  Substance Use Topics  . Smoking status: Current Every Day Smoker -- 0.50 packs/day for 30 years    Types: Cigarettes  . Smokeless tobacco: Never Used  . Alcohol Use: 183.0 oz/week    305 Glasses of wine per week     Comment: 11/15/2012 "1/2 glass of red wine ~ qd"   Additional social history: smokes <1/2 ppd, every other day drinks some red  wine, no recreational drug use now Please also refer to relevant sections of EMR.  Family History: Family History  Problem Relation Age of Onset  . Alcohol abuse Mother   . Depression Mother   . Heart disease Mother   . Diabetes Mother   . Stroke Mother   . Diabetes Other     1/2 brother  . Hepatitis Brother    Allergies and Medications: Allergies  Allergen Reactions  . Betamethasone Dipropionate     Unknown  . Bupropion Hcl     Unknown  . Ciprofloxacin     REACTION: swelling  . Clobetasol     Unknown  . Codeine     Unknown  . Escitalopram Oxalate     Unknown  . Flagyl [Metronidazole]     REACTION: swelling  . Fluoxetine Hcl     Unknown  . Fluticasone-Salmeterol     Unknown  . Furosemide     Unknown  . Immune Globulins     Acute renal failure  . Paroxetine     Unknown  . Penicillins     Unknown  . Statins     Unknown  . Sulfa Antibiotics Other (See Comments)    blisters  . Tacrolimus      Unknown  . Tetanus Toxoid     Unknown  . Tuberculin Purified Protein Derivative     Unknown   No current facility-administered medications on file prior to encounter.   Current Outpatient Prescriptions on File Prior to Encounter  Medication Sig Dispense Refill  . amiodarone (PACERONE) 200 MG tablet Take 1 tablet (200 mg total) by mouth daily before breakfast.  30 tablet  5  . aspirin EC 81 MG EC tablet Take 1 tablet (81 mg total) by mouth daily.      . collagenase (SANTYL) ointment Apply 1 application topically 3 (three) times daily as needed (for wound care).       Marland Kitchen esomeprazole (NEXIUM) 40 MG capsule Take 40 mg by mouth daily before breakfast.      . Ipratropium-Albuterol (COMBIVENT) 20-100 MCG/ACT AERS respimat Inhale 1 puff into the lungs every 6 (six) hours.      . meloxicam (MOBIC) 15 MG tablet Take 1 tablet (15 mg total) by mouth daily.  30 tablet  0  . methotrexate (RHEUMATREX) 2.5 MG tablet Take 7.5 mg by mouth once a week. Caution:Chemotherapy. Protect from light.      . nitroGLYCERIN (NITROSTAT) 0.4 MG SL tablet Place 0.4 mg under the tongue every 5 (five) minutes as needed. For chest pain      . torsemide (DEMADEX) 20 MG tablet Take 1 tablet (20 mg total) by mouth daily.  30 tablet  2  . [DISCONTINUED] oxybutynin (DITROPAN) 5 MG tablet Take 1 tablet (5 mg total) by mouth 3 (three) times daily.  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) Cardiovascular: Regular rate and rhythm, no murmurs Respiratory: Clear bilaterally via anterior auscultation, normal respiratory effort Abdomen: Soft, nontender to palpation, no masses organomegaly appreciable Extremities: 1-2+ pitting edema bilateral lower extremities Skin: No rashes  noted Neuro: Grossly nonfocal, speech intact, moves all extremities spontaneously Psych: Normal affect, speech normal in rate and volume, and normal amount of eye contact  Labs and Imaging: CBC  Recent Labs Lab 12/28/12 1159  WBC 5.1  HGB 13.6  HCT 41.3  PLT 189     CMET  Recent Labs Lab 12/28/12 1159  NA 145  K 3.3*  CL 106  CO2 31  BUN 15  CREATININE 0.94  GLUCOSE 83  CALCIUM 8.7  AST 13  ALT 7  ALKPHOS 63  PROT 6.8  ALBUMIN 3.3*       CXR: New pulmonary vascular congestion. Minimal left base atelectasis.  Point of care troponin negative  EKG: three EKG's done in ER, one of which showed atrial fibrillation with vent rate of 108, with diffuse ST segment depression Next EKG showed resolution of ST depression, sinus bradycardia  Latrelle Dodrill, MD 12/28/2012, 2:27 PM PGY-2, [facility] Family Medicine FPTS Intern pager: [phone], text pages welcome

## 2012-12-29 DIAGNOSIS — Z7289 Other problems related to lifestyle: Secondary | ICD-10-CM

## 2012-12-29 DIAGNOSIS — Z8669 Personal history of other diseases of the nervous system and sense organs: Secondary | ICD-10-CM

## 2012-12-29 DIAGNOSIS — R079 Chest pain, unspecified: Secondary | ICD-10-CM

## 2012-12-29 LAB — BASIC METABOLIC PANEL
CO2: 31 mEq/L (ref 19–32)
Calcium: 8.2 mg/dL — ABNORMAL LOW (ref 8.4–10.5)
Chloride: 109 mEq/L (ref 96–112)
Creatinine, Ser: 0.92 mg/dL (ref 0.50–1.35)
GFR calc Af Amer: >90 mL/min (ref >90)
GFR calc non Af Amer: 88 mL/min — ABNORMAL LOW (ref >90)
Glucose, Bld: 80 mg/dL (ref 70–99)
Sodium: 145 mEq/L (ref 135–145)

## 2012-12-29 LAB — LIPID PANEL
Cholesterol: 157 mg/dL (ref 0–200)
HDL: 51 mg/dL (ref >39)
Total CHOL/HDL Ratio: 3.1 RATIO
VLDL: 18 mg/dL (ref 0–40)

## 2012-12-29 LAB — URINALYSIS, ROUTINE W REFLEX MICROSCOPIC
Glucose, UA: NEGATIVE mg/dL
Hgb urine dipstick: NEGATIVE
Ketones, ur: NEGATIVE mg/dL
Leukocytes, UA: NEGATIVE
Nitrite: NEGATIVE
Protein, ur: NEGATIVE mg/dL
Urobilinogen, UA: 1 mg/dL (ref 0.0–1.0)

## 2012-12-29 LAB — CBC
Hemoglobin: 12.7 g/dL — ABNORMAL LOW (ref 13.0–17.0)
MCH: 31.9 pg (ref 26.0–34.0)
MCHC: 32.3 g/dL (ref 30.0–36.0)
MCV: 98.7 fL (ref 78.0–100.0)
Platelets: 193 10*3/uL (ref 150–400)
RBC: 3.98 MIL/uL — ABNORMAL LOW (ref 4.22–5.81)
RDW: 18.4 % — ABNORMAL HIGH (ref 11.5–15.5)
WBC: 5.4 10*3/uL (ref 4.0–10.5)

## 2012-12-29 LAB — TROPONIN I: Troponin I: <0.3 ng/mL (ref <0.30)

## 2012-12-29 IMAGING — CT CT HEAD W/O CM
1 of 2 series · 13 of 30 positions shown, 17 images · non-contrast
Comparison: Head CT scan 07/22/2009.

CLINICAL DATA: Syncope.

CT HEAD WITHOUT CONTRAST
TECHNIQUE: Contiguous axial images were obtained from the base of
the skull through the vertex without contrast.

[Series 2: brain · axial · 0.47mm/px · z∈[+96,+231]mm · 13 of 32 slices shown, 17 images]
[im 3/32  brain]
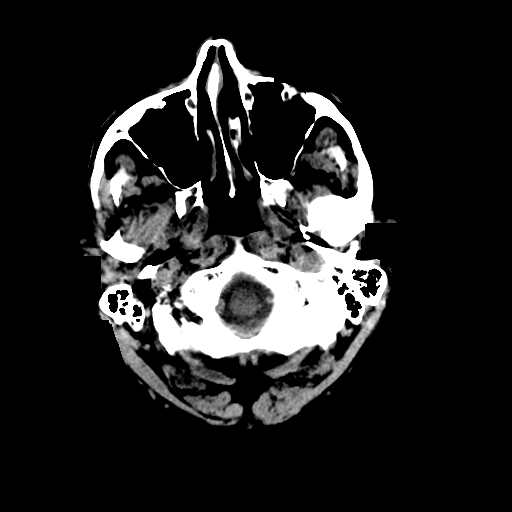
[im 3/32  bone]
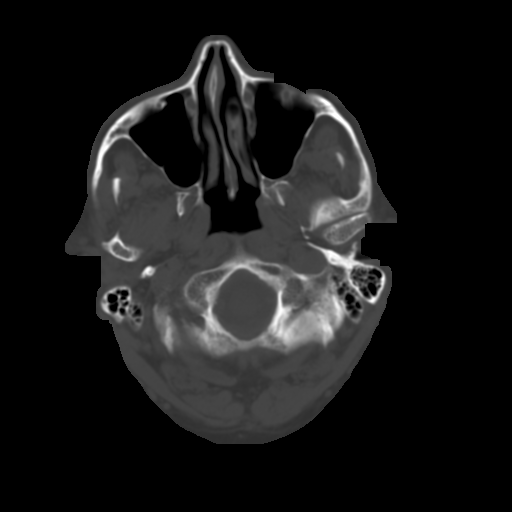
[im 5/32  brain]
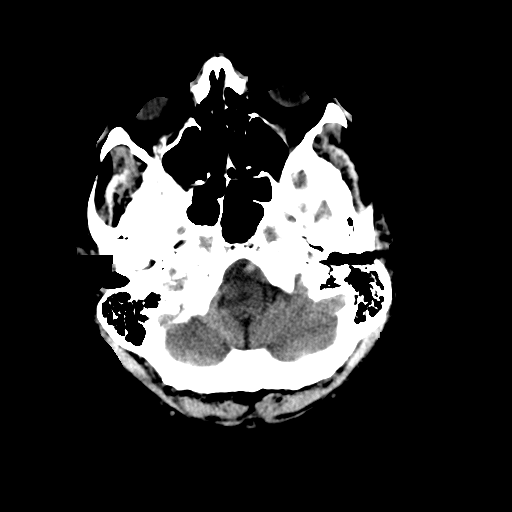
[im 7/32  brain]
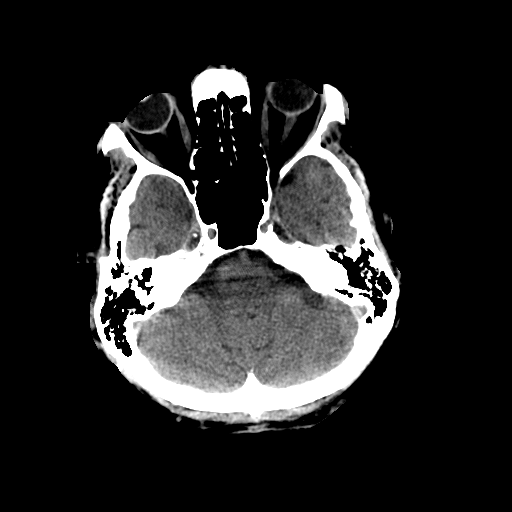
[im 9/32  brain]
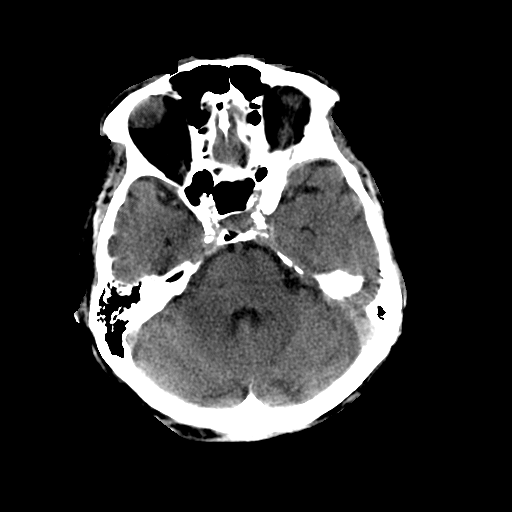
[im 12/32  brain]
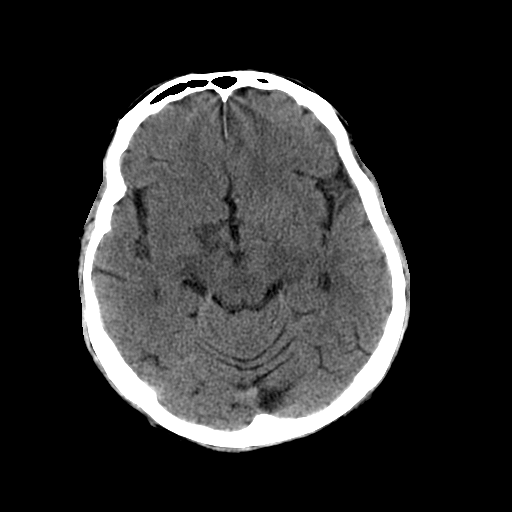
[im 12/32  bone]
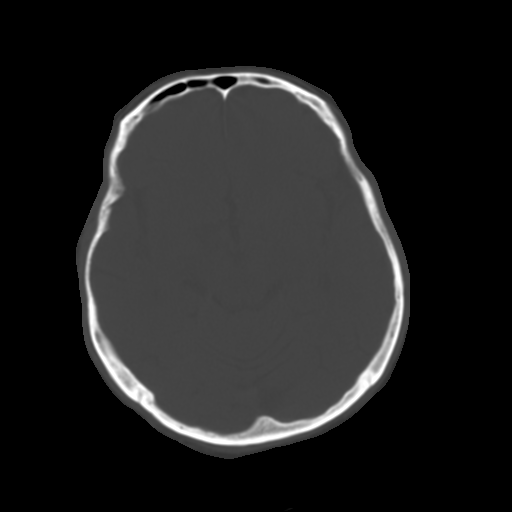
[im 14/32  brain]
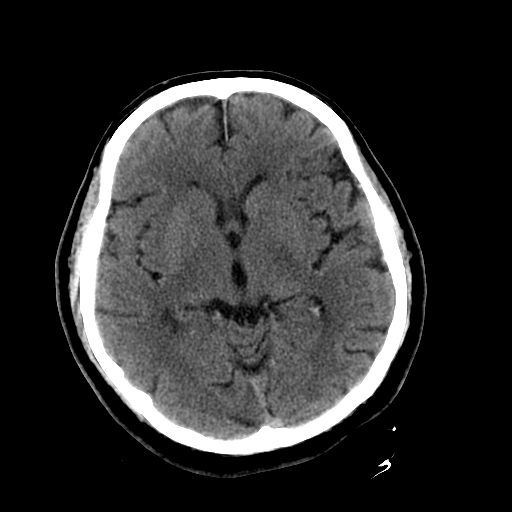
[im 16/32  brain]
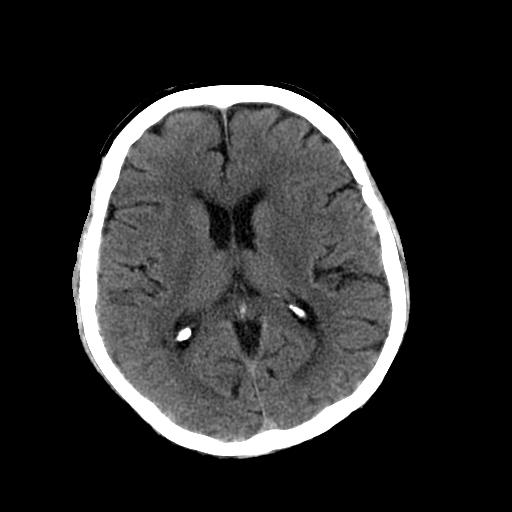
[im 18/32  brain]
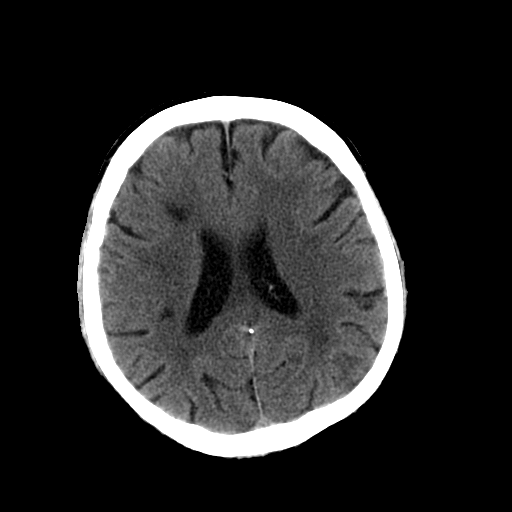
[im 20/32  brain]
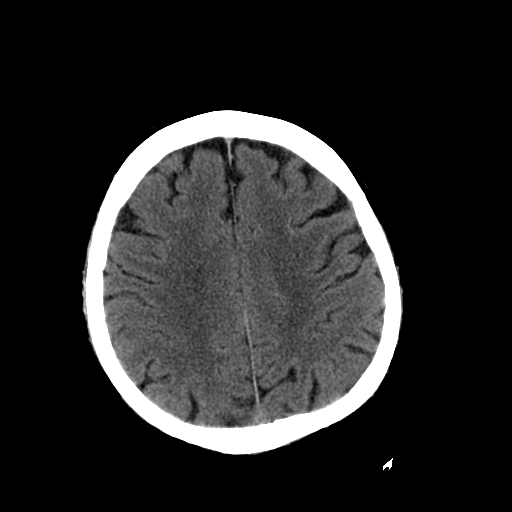
[im 20/32  bone]
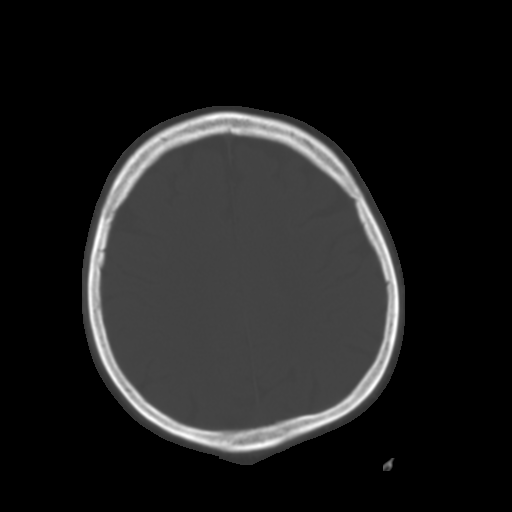
[im 23/32  brain]
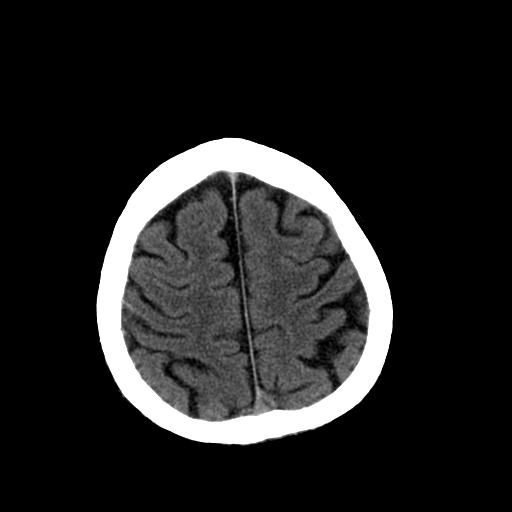
[im 25/32  brain]
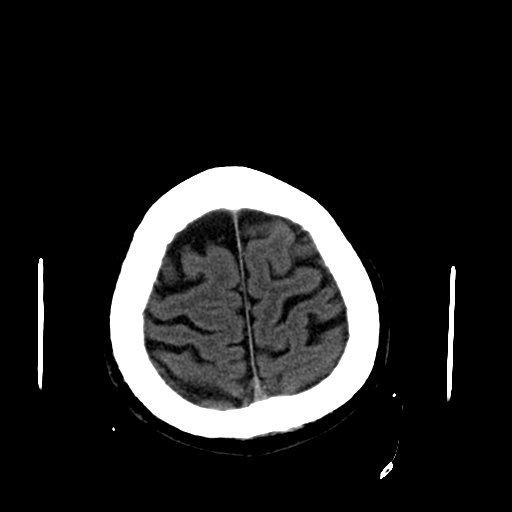
[im 27/32  brain]
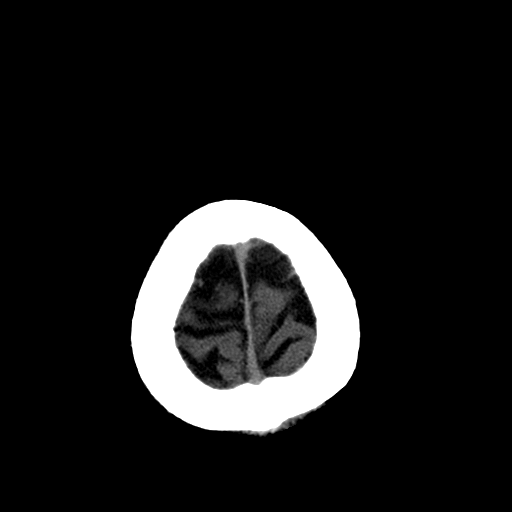
[im 29/32  brain]
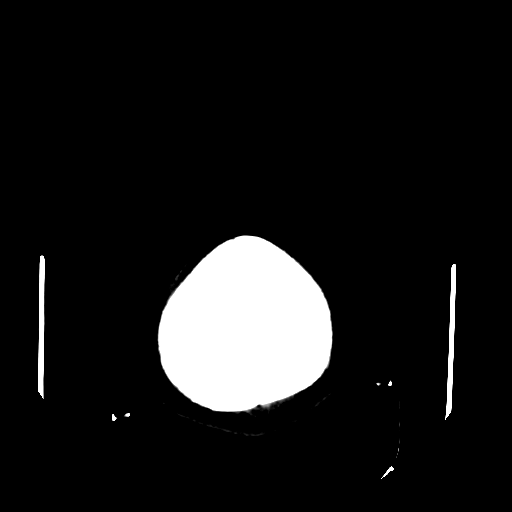
[im 29/32  bone]
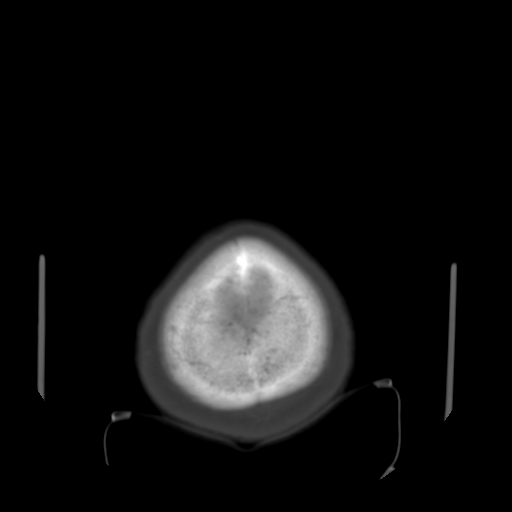

[13 of 30 positions shown; findings below may reference images not displayed]

FINDINGS: The patient has hypoattenuation in the subcortical and
periventricular deep white matter compatible with chronic
microvascular ischemic change.  Changes show progression since the
prior study with some deep white matter infarctions now identified
which appear remote.  No evidence of acute infarction, hemorrhage,
mass lesion, mass effect, midline shift or abnormal extra-axial
fluid collection.  No hydrocephalus.  Imaged paranasal sinuses and
mastoid air cells are clear.
IMPRESSION: 1.  No acute finding.
2.  Progressive microvascular ischemic change.

## 2012-12-29 MED ORDER — LOVASTATIN 40 MG PO TABS: 40.0000 mg | ORAL_TABLET | Freq: Every day | ORAL | Status: DC

## 2012-12-29 MED ORDER — IPRATROPIUM-ALBUTEROL 20-100 MCG/ACT IN AERS
1.0000 | INHALATION_SPRAY | Freq: Four times a day (QID) | RESPIRATORY_TRACT | Status: DC | PRN
  Filled 2012-12-29: qty 4

## 2012-12-29 MED ORDER — LORAZEPAM 1 MG PO TABS: 1.0000 mg | ORAL_TABLET | Freq: Four times a day (QID) | ORAL | Status: DC | PRN

## 2012-12-29 MED ORDER — DIVALPROEX SODIUM ER 500 MG PO TB24: 500.0000 mg | ORAL_TABLET | Freq: Two times a day (BID) | ORAL | Status: DC

## 2012-12-29 MED ORDER — LISINOPRIL 10 MG PO TABS: 10.0000 mg | ORAL_TABLET | Freq: Every day | ORAL | Status: DC

## 2012-12-29 MED ORDER — ADULT MULTIVITAMIN W/MINERALS CH
1.0000 | ORAL_TABLET | Freq: Every day | ORAL | Status: DC
  Administered 2012-12-29: 1 via ORAL
  Filled 2012-12-29: qty 1

## 2012-12-29 MED ORDER — THIAMINE HCL 100 MG/ML IJ SOLN
100.0000 mg | Freq: Every day | INTRAMUSCULAR | Status: DC
  Filled 2012-12-29: qty 1

## 2012-12-29 MED ORDER — LISINOPRIL 10 MG PO TABS
10.0000 mg | ORAL_TABLET | Freq: Every day | ORAL | Status: DC
  Administered 2012-12-29: 10 mg via ORAL
  Filled 2012-12-29: qty 1

## 2012-12-29 MED ORDER — LORAZEPAM 2 MG/ML IJ SOLN: 1.0000 mg | Freq: Four times a day (QID) | INTRAMUSCULAR | Status: DC | PRN

## 2012-12-29 MED ORDER — IPRATROPIUM-ALBUTEROL 20-100 MCG/ACT IN AERS
1.0000 | INHALATION_SPRAY | Freq: Four times a day (QID) | RESPIRATORY_TRACT | Status: DC
  Administered 2012-12-29: 1 via RESPIRATORY_TRACT
  Filled 2012-12-29: qty 4

## 2012-12-29 MED ORDER — VITAMIN B-1 100 MG PO TABS
100.0000 mg | ORAL_TABLET | Freq: Every day | ORAL | Status: DC
  Administered 2012-12-29: 11:00:00 100 mg via ORAL
  Filled 2012-12-29: qty 1

## 2012-12-29 MED ORDER — FOLIC ACID 1 MG PO TABS
1.0000 mg | ORAL_TABLET | Freq: Every day | ORAL | Status: DC
  Administered 2012-12-29: 1 mg via ORAL
  Filled 2012-12-29: qty 1

## 2012-12-29 MED ORDER — ADULT MULTIVITAMIN W/MINERALS CH: 1.0000 | ORAL_TABLET | Freq: Every day | ORAL | Status: DC

## 2012-12-29 NOTE — Progress Notes (Addendum)
Attending Addendum  I examined the patient and discussed the assessment and plan with Dr. Caleb Popp. I have reviewed the note and agree.  Patient seen by his cardiologist and will have outpatient Lexiscan. Ruled out regarding ACS. Medically stable for discharge today once weaned to room air.     Dessa Phi, MD FAMILY MEDICINE TEACHING SERVICE

## 2012-12-29 NOTE — Progress Notes (Signed)
Subjective: No CP  No SOB   Objective: Filed Vitals:   12/29/12 0400 12/29/12 0412 12/29/12 0500 12/29/12 0730  BP: 158/93     Pulse: 72     Temp:  98 F (36.7 C)  98.1 F (36.7 C)  TempSrc:  Oral  Oral  Resp: 18     Height:      Weight:   207 lb 10.8 oz (94.2 kg)   SpO2: 96%      Weight change:   Intake/Output Summary (Last 24 hours) at 12/29/12 0804 Last data filed at 12/29/12 0600  Gross per 24 hour  Intake    900 ml  Output   1650 ml  Net   -750 ml    General: Alert, awake, oriented x3, in no acute distress Neck:  JVP is normal Heart: Regular rate and rhythm, Gr i/VI systolic murmur, rubs, gallops.  Lungs: Clear to auscultation.  No rales or wheezes. Exemities:  No edema.   Neuro: Grossly intact, nonfocal.  Tele:  SR   Lab Results: Results for orders placed during the hospital encounter of 12/28/12 (from the past 24 hour(s))  BASIC METABOLIC PANEL     Status: Abnormal   Collection Time    12/28/12 11:59 AM      Result Value Range   Sodium 145  135 - 145 mEq/L   Potassium 3.3 (*) 3.5 - 5.1 mEq/L   Chloride 106  96 - 112 mEq/L   CO2 31  19 - 32 mEq/L   Glucose, Bld 83  70 - 99 mg/dL   BUN 15  6 - 23 mg/dL   Creatinine, Ser 0.45  0.50 - 1.35 mg/dL   Calcium 8.7  8.4 - 40.9 mg/dL   GFR calc non Af Amer 87 (*) >90 mL/min   GFR calc Af Amer >90  >90 mL/min  CBC     Status: Abnormal   Collection Time    12/28/12 11:59 AM      Result Value Range   WBC 5.1  4.0 - 10.5 K/uL   RBC 4.21 (*) 4.22 - 5.81 MIL/uL   Hemoglobin 13.6  13.0 - 17.0 g/dL   HCT 81.1  91.4 - 78.2 %   MCV 98.1  78.0 - 100.0 fL   MCH 32.3  26.0 - 34.0 pg   MCHC 32.9  30.0 - 36.0 g/dL   RDW 95.6 (*) 21.3 - 08.6 %   Platelets 189  150 - 400 K/uL  MAGNESIUM     Status: None   Collection Time    12/28/12 11:59 AM      Result Value Range   Magnesium 2.0  1.5 - 2.5 mg/dL  PRO B NATRIURETIC PEPTIDE     Status: Abnormal   Collection Time    12/28/12 11:59 AM      Result Value Range   Pro B  Natriuretic peptide (BNP) 211.8 (*) 0 - 125 pg/mL  VALPROIC ACID LEVEL     Status: Abnormal   Collection Time    12/28/12 11:59 AM      Result Value Range   Valproic Acid Lvl 28.6 (*) 50.0 - 100.0 ug/mL  HEPATIC FUNCTION PANEL     Status: Abnormal   Collection Time    12/28/12 11:59 AM      Result Value Range   Total Protein 6.8  6.0 - 8.3 g/dL   Albumin 3.3 (*) 3.5 - 5.2 g/dL   AST 13  0 - 37 U/L  ALT 7  0 - 53 U/L   Alkaline Phosphatase 63  39 - 117 U/L   Total Bilirubin 0.6  0.3 - 1.2 mg/dL   Bilirubin, Direct 0.1  0.0 - 0.3 mg/dL   Indirect Bilirubin 0.5  0.3 - 0.9 mg/dL  TROPONIN I     Status: None   Collection Time    12/28/12 11:59 AM      Result Value Range   Troponin I <0.30  <0.30 ng/mL  POCT I-STAT TROPONIN I     Status: None   Collection Time    12/28/12 12:11 PM      Result Value Range   Troponin i, poc 0.00  0.00 - 0.08 ng/mL   Comment 3           GLUCOSE, CAPILLARY     Status: Abnormal   Collection Time    12/28/12  5:45 PM      Result Value Range   Glucose-Capillary 118 (*) 70 - 99 mg/dL  MRSA PCR SCREENING     Status: None   Collection Time    12/28/12  6:15 PM      Result Value Range   MRSA by PCR NEGATIVE  NEGATIVE  TROPONIN I     Status: None   Collection Time    12/28/12  6:43 PM      Result Value Range   Troponin I <0.30  <0.30 ng/mL  TROPONIN I     Status: None   Collection Time    12/29/12 12:11 AM      Result Value Range   Troponin I <0.30  <0.30 ng/mL  TROPONIN I     Status: None   Collection Time    12/29/12  4:25 AM      Result Value Range   Troponin I <0.30  <0.30 ng/mL  CBC     Status: Abnormal   Collection Time    12/29/12  4:25 AM      Result Value Range   WBC 5.4  4.0 - 10.5 K/uL   RBC 3.98 (*) 4.22 - 5.81 MIL/uL   Hemoglobin 12.7 (*) 13.0 - 17.0 g/dL   HCT 21.3  08.6 - 57.8 %   MCV 98.7  78.0 - 100.0 fL   MCH 31.9  26.0 - 34.0 pg   MCHC 32.3  30.0 - 36.0 g/dL   RDW 46.9 (*) 62.9 - 52.8 %   Platelets 193  150 - 400  K/uL  BASIC METABOLIC PANEL     Status: Abnormal   Collection Time    12/29/12  4:25 AM      Result Value Range   Sodium 145  135 - 145 mEq/L   Potassium 3.5  3.5 - 5.1 mEq/L   Chloride 109  96 - 112 mEq/L   CO2 31  19 - 32 mEq/L   Glucose, Bld 80  70 - 99 mg/dL   BUN 14  6 - 23 mg/dL   Creatinine, Ser 4.13  0.50 - 1.35 mg/dL   Calcium 8.2 (*) 8.4 - 10.5 mg/dL   GFR calc non Af Amer 88 (*) >90 mL/min   GFR calc Af Amer >90  >90 mL/min  LIPID PANEL     Status: None   Collection Time    12/29/12  4:25 AM      Result Value Range   Cholesterol 157  0 - 200 mg/dL   Triglycerides 91  <244 mg/dL   HDL 51  >01 mg/dL  Total CHOL/HDL Ratio 3.1     VLDL 18  0 - 40 mg/dL   LDL Cholesterol 88  0 - 99 mg/dL    Studies/Results: @RISRSLT24 @  Medications: REviewed   @PROBHOSP @ 1.  CP  He ruled out for MI  Currently without sympotms.  He did not take medicines yesterday AM  Was in Afib with RVR.   May be due to this  I will set patient up for outpatinet lexiscan myoview.  OK to d/c    2.  Atrial fibrillation I would keep on current regimen  I do nto think he is a coumadin candidate  He felll last December and had SDH  He is not sure what happened.  Hx of sizues and suicidal ideation, psych issues    3.  HTN  BP is ahigh this AM  I will follow up as outpatinet  4.  DM  5.   Hx Subdural hematoma  6.  RA  7  Schizoaffective disorder  8  AS  Mild by echo in 2013  9  Dematomyocytis   LOS: 1 day   Patrick Holt 12/29/2012, 8:04 AM

## 2012-12-29 NOTE — Progress Notes (Signed)
Pt given discharge instructions and verbalized understanding. Belonging returned to pt. Pt pain free.

## 2012-12-29 NOTE — Clinical Social Work Note (Addendum)
CSW received phone call from [facility] that pt needs taxi voucher. CSW got approval from Wandra Mannan, CSW supervisor, and provided voucher to [employer]. Pt discharging from hospital, so CSW signing off.   Maryclare Labrador, MSW, [facility] Clinical Social Worker [phone]

## 2012-12-29 NOTE — Care Management Note (Signed)
    Page 1 of 1   12/29/2012     4:06:15 PM   CARE MANAGEMENT NOTE 12/29/2012  Patient:  Patrick Holt, Patrick Holt   Account Number:  [id number]  Date Initiated:  12/29/2012  Documentation initiated by:  Avie Arenas  Subjective/Objective Assessment:   Admitted with CP - Afib.     Action/Plan:   Anticipated DC Date:  12/30/2012   Anticipated DC Plan:  HOME/SELF CARE      DC Planning Services  CM consult      Choice offered to / List presented to:             Status of service:  Completed, signed off Medicare Important Message given?   (If response is "NO", the following Medicare IM given date fields will be blank) Date Medicare IM given:   Date Additional Medicare IM given:    Discharge Disposition:  HOME/SELF CARE  Per UR Regulation:  Reviewed for med. necessity/level of care/duration of stay  If discussed at Long Length of Stay Meetings, dates discussed:    Comments:  Contact:  Raeanne Gathers Friend [phone]   [phone]  12-29-12 4pm Avie Arenas, RNBSN [phone] Lives alone, walks with walker.  States no family that he wants to be associated with.  Plan for dc today.  has no needs except needs ride home.  SW consult placed for cab voucher.

## 2012-12-29 NOTE — Progress Notes (Signed)
Nutrition Brief Note  Patient identified on the Malnutrition Screening Tool (MST) Report  Wt Readings from Last 15 Encounters:  12/29/12 207 lb 10.8 oz (94.2 kg)  11/20/12 193 lb (87.544 kg)  11/09/12 181 lb 4.8 oz (82.237 kg)  10/17/12 188 lb 15 oz (85.7 kg)  10/06/12 206 lb (93.441 kg)  10/04/12 208 lb (94.348 kg)  09/27/12 190 lb (86.183 kg)  09/27/12 190 lb (86.183 kg)  09/08/12 207 lb 12.8 oz (94.257 kg)  09/05/12 209 lb 3.2 oz (94.892 kg)  08/04/12 208 lb (94.348 kg)  07/26/12 217 lb (98.431 kg)  07/05/12 230 lb (104.327 kg)  06/29/12 230 lb 1.6 oz (104.373 kg)  06/26/12 230 lb 12.8 oz (104.69 kg)    Body mass index is 36.8 kg/(m^2). Patient meets criteria for obesity grade II based on current BMI.   Pt reports that he lost weight before his previous hospital visit in September 2014. He has since gained most of it back, and he says that he has a good appetite. He says that he needs no nutritional supplementation at this time.  Current diet order is heart healthy, patient is consuming approximately 90% of meals at this time. Labs and medications reviewed.   No nutrition interventions warranted at this time. If nutrition issues arise, please consult RD.   Ebbie Latus RD, LDN

## 2012-12-29 NOTE — Discharge Summary (Signed)
Family Medicine [facility] Discharge Summary  Patient name: Patrick Holt Medical record number: [id number] Date of birth: 11-Dec-1950 Age: 62 y.o. Gender: male Date of Admission: 12/28/2012  Date of Discharge: 12/29/2012 Admitting Physician: Lora Paula, MD  Primary Care Provider: Gaspar Bidding, DO Consultants: Cardiology  Indication for Hospitalization: Chest pain and shortness of breath  Discharge Diagnoses/Problem List:  1. Chest pain 2. Hypokalemia 3. GERD 4. COPD 5. Seizure disorder 6. Atrial fibrillation 7. Hyperlipidemia 8. Urinary frequency 9. Diabetes mellitus type 2  Disposition: Discharge home  Discharge Condition: Stable  Discharge Exam:  General: Laying in bed in no acute distress  Cardiovascular: RRR, II/VI holosystolic murmur heard best in lower left sternal border  Respiratory: Good air movement, mild wheezes, no rhonchi, no increased work of breathing  Abdomen: soft, non-tender, non-distended  Extremities: No cyanosis, 2+ pulses  Brief Hospital Course: Patient presented to clinic and had chest pain which radiated to his left arm, jaw and shoulder. He experienced numbness in his neck and jaw. He does not note it was worse with exertion because he did not want. He had mild nausea but no vomiting. Also endorses having had mild shortness of breath. Denies diaphoresis.  1. Chest pain: When patient arrived to the ED, first EKG showed atrial fibrillation with ventricle rate of 108 and diffuse ST segment depression. A subsequent EKG showed resolution of ST depression and sinus bradycardia. Received three nitroglycerin tablets which did not help. Patient admitted to step down for active chest pain. Troponin was negative x3. He did not use morphine for pain and chest pain resolved on Day #2. Cardiology saw patient and recommended outpatient stress test, which was set up by them. Chest pain most likely due to demand from atrial  fibrillation. 2. Hypokalemia: potassium 3.3 on admission. Repleted with potassium PO. 3. GERD: continued PPI 4. COPD: continued Combivent 5. Seizure disorder: continued Keppra. Depakote not continued inpatient because of confusion of home regimen. Discharged on home Keppra and Depakote 500mg  24hr BID. 6. Atrial fibrillation: continued home amiodorone. Rate was initially 100+ but came down to 50-60s. Patient did not take medication the day prior to admission. Patient discharged with stable heart rate 7. Hyperlipidemia: patient should be on high intensity statin but cannot afford. Started on lovastatin 40mg  for moderate intensity therapy since prescription available for $4 at [facility], allowing for patient compliance 8. Urinary frequency: continued home oxybutynin 9. Diabetes mellitus type 2: Last A1C was 5.3. Not managed during admission  Issues for Follow Up:  1. Compliance with statin therapy 2. Follow-up with cardiology for stress test  Significant Procedures: None  Significant Labs and Imaging:   Recent Labs Lab 12/28/12 1159 12/29/12 0425 12/30/12 1135  WBC 5.1 5.4 7.0  HGB 13.6 12.7* 13.8  HCT 41.3 39.3 41.3  PLT 189 193 227    Recent Labs Lab 12/28/12 1159 12/29/12 0425 12/30/12 1135  NA 145 145 142  K 3.3* 3.5 3.5  CL 106 109 105  CO2 31 31 27   GLUCOSE 83 80 90  BUN 15 14 15   CREATININE 0.94 0.92 0.90  CALCIUM 8.7 8.2* 9.0  MG 2.0  --   --   ALKPHOS 63  --   --   AST 13  --   --   ALT 7  --   --   ALBUMIN 3.3*  --   --     Cardiac Panel (last 3 results)  Recent Labs  12/29/12 0425 12/30/12 1135 12/30/12 1423  TROPONINI <0.30 <0.30 <0.30    Results/Tests Pending at Time of Discharge: None  Discharge Medications:    Medication List    STOP taking these medications       collagenase ointment  Commonly known as:  SANTYL      TAKE these medications       ALPRAZolam 1 MG tablet  Commonly known as:  XANAX  Take 1 mg by mouth at bedtime as  needed for anxiety.     amiodarone 200 MG tablet  Commonly known as:  PACERONE  Take 1 tablet (200 mg total) by mouth daily before breakfast.     aspirin 81 MG EC tablet  Take 1 tablet (81 mg total) by mouth daily.     divalproex 500 MG 24 hr tablet  Commonly known as:  DEPAKOTE ER  Take 1 tablet (500 mg total) by mouth 2 (two) times daily.     esomeprazole 40 MG capsule  Commonly known as:  NEXIUM  Take 40 mg by mouth daily before breakfast.     Ipratropium-Albuterol 20-100 MCG/ACT Aers respimat  Commonly known as:  COMBIVENT  Inhale 1 puff into the lungs every 6 (six) hours.     levETIRAcetam 500 MG tablet  Commonly known as:  KEPPRA  Take 500 mg by mouth daily.     lisinopril 10 MG tablet  Commonly known as:  PRINIVIL,ZESTRIL  Take 1 tablet (10 mg total) by mouth daily.     lovastatin 40 MG tablet  Commonly known as:  MEVACOR  Take 1 tablet (40 mg total) by mouth at bedtime.     meloxicam 15 MG tablet  Commonly known as:  MOBIC  Take 1 tablet (15 mg total) by mouth daily.     methotrexate 2.5 MG tablet  Commonly known as:  RHEUMATREX  Take 7.5 mg by mouth once a week. Caution:Chemotherapy. Protect from light.     multivitamin with minerals Tabs tablet  Take 1 tablet by mouth daily.     nitroGLYCERIN 0.4 MG SL tablet  Commonly known as:  NITROSTAT  Place 0.4 mg under the tongue every 5 (five) minutes as needed. For chest pain     oxybutynin 5 MG tablet  Commonly known as:  DITROPAN  Take 5 mg by mouth daily.     risperiDONE 1 MG disintegrating tablet  Commonly known as:  RISPERDAL M-TABS  Take 1 mg by mouth daily.     rOPINIRole 2 MG tablet  Commonly known as:  REQUIP  Take 2 mg by mouth daily.     torsemide 20 MG tablet  Commonly known as:  DEMADEX  Take 1 tablet (20 mg total) by mouth daily.        Discharge Instructions: Please refer to Patient Instructions section of EMR for full details.  Patient was counseled important signs and symptoms that  should prompt return to medical care, changes in medications, dietary instructions, activity restrictions, and follow up appointments.   Follow-Up Appointments: Follow-up Information   Follow up with RIGBY, MICHAEL, DO On 01/08/2013. (4:15PM for hospital follow-up)    Specialty:  Family Medicine   Contact information:   [address] [place] [address] [phone]       Jacquelin Hawking, MD 12/30/2012, 2:08 PM PGY-1, [facility]

## 2012-12-29 NOTE — Progress Notes (Signed)
Family Medicine Teaching Service Daily Progress Note Intern Pager: [phone]  Patient name: Patrick Holt Medical record number: [id number] Date of birth: [birth year] Age: 62 y.o. Gender: male  Primary Care Provider: Gaspar Bidding, DO Consultants: None Code Status: Full Code  Pt Overview and Major Events to Date:   Assessment and Plan: ITHAN TOUHEY is a 62 y.o. male presenting with chest pain. PMH is significant for CAD, several MI's, COPD, GERD, atrial fibrillation, among other problems.   # Chest pain: some atypical features, but with hx must be concerned about ACS. Thus far EKG's reassuring (ST depression resolved and was not in a pattern of localized ischemia) and troponin negative. Troponin neg x3. LDL 88. - continue prn morphine & nitroglycerin  - follow-up EKG in AM  - consult cardiology today and follow-up recommendations  # Hypokalemia: corrected to 3.5  # Psych: continue home psych meds   # GERD; continue PPI   # COPD: continue combivent prn   # Hx seizure disorder: continue keppra, pending pharmacy review regarding depakote dosing (has two different doses listed)   # Atrial fibrillation: initially with HR >100 in afib, now sinus rhythm, intermittently sinus bradycardia  - per PCP's note, is not a candidate for anticoagulation due to prior subdural bleed   # Hyperlipidemia: lipid panel unremarkable. patient states he cannot afford his home lipid medication, but given his hx he should be on a high intensity statin - prescribe lovastatin 40mg  on discharge (moderate-intensity) since it is on [employer] $4 list  # Urinary frequency - continue home oxybutynin  #DM2  FEN/GI: heart healthy diet, SLIV  Prophylaxis: SQ heparin   Disposition: pending cardiology recommendations  Subjective: [name] reports no chest pain and no shortness of breath. He is currently on 3L [place]. He reports no events overnight.  Objective: Temp:  [97.6 F (36.4 C)-98.7 F (37.1  C)] 98.1 F (36.7 C) (11/14 0730) Pulse Rate:  [39-119] 51 (11/14 0800) Resp:  [11-23] 14 (11/14 0800) BP: (133-174)/(67-121) 166/97 mmHg (11/14 0800) SpO2:  [94 %-100 %] 96 % (11/14 0800) Weight:  [207 lb 10.8 oz (94.2 kg)-210 lb 12.2 oz (95.6 kg)] 207 lb 10.8 oz (94.2 kg) (11/14 0500)  Physical Exam: General: Laying in bed in no acute distress Cardiovascular: RRR, II/VI holosystolic murmur heard best in lower left sternal border Respiratory: Good air movement, mild wheezes, no rhonchi, no increased work of breathing Abdomen: soft, non-tender, non-distended Extremities: No cyanosis, 2+ pulses  Laboratory:  Recent Labs Lab 12/28/12 1159 12/29/12 0425  WBC 5.1 5.4  HGB 13.6 12.7*  HCT 41.3 39.3  PLT 189 193    Recent Labs Lab 12/28/12 1159 12/29/12 0425  NA 145 145  K 3.3* 3.5  CL 106 109  CO2 31 31  BUN 15 14  CREATININE 0.94 0.92  CALCIUM 8.7 8.2*  PROT 6.8  --   BILITOT 0.6  --   ALKPHOS 63  --   ALT 7  --   AST 13  --   GLUCOSE 83 80    Lipid Panel     Component Value Date/Time   CHOL 157 12/29/2012 0425   TRIG 91 12/29/2012 0425   HDL 51 12/29/2012 0425   CHOLHDL 3.1 12/29/2012 0425   VLDL 18 12/29/2012 0425   LDLCALC 88 12/29/2012 0425    Imaging/Diagnostic Tests:  EKG (11/14) Sinus bradycardia, normal axis   Jacquelin Hawking, MD 12/29/2012, 8:56 AM PGY-1, [facility] FPTS Intern pager: [phone], text pages  welcome

## 2012-12-29 NOTE — Consult Note (Signed)
Reason for Consult: chest pain Referring Physician: Khali Holt is an 62 y.o. male.  HPI: Patrick Holt is a 62 yo man with PMH of GERD, COPD, CAD, atrial fibrillation and mental health who had chest pain lasting thirty minutes at Dr. Janeece Riggers office. He tells me otherwise he's trying to walk with his walker approximately 1 mile most days of the week and he doesn't get chest pain then. He did have some associated radiation to left arm/jaw/shoulder some with numbness and nause and shortness of breath. In the ER he was initially in the 110s and eventually converted to sinus rhythm later in the evening. He's currently chest pain free. Cardiology consulted given negative cardiac markers and prior history of CAD and know to Dr. Tenny Craw.    Past Medical History  Diagnosis Date  . Rheumatoid arthritis(714.0)   . Obesity   . Major depression     with acute psychotic break in 06/2010  . Hypertension   . Hyperlipidemia   . Diverticulosis of colon   . COPD (chronic obstructive pulmonary disease)   . Anxiety   . GERD (gastroesophageal reflux disease)   . Vertigo   . Fibromyalgia   . Dermatomyositis   . Myocardial infarct     mulitple (1999, 2000, 2004)  . Raynaud's disease   . Narcotic dependence   . Peripheral neuropathy   . Internal hemorrhoids   . Ischemic heart disease   . Hiatal hernia   . Gastritis   . Diverticulitis   . Hx of adenomatous colonic polyps   . Nephrolithiasis   . Anemia   . Esophageal stricture   . Esophageal dysmotility   . Dermatomyositis   . Urge incontinence   . Otosclerosis   . Bipolar 1 disorder   . OCD (obsessive compulsive disorder)   . Sarcoidosis   . Paroxysmal a-fib   . Dysrhythmia     "irregular" (11/15/2012)  . Type II diabetes mellitus   . Seizures   . Headache(784.0)     "severe; get them often" (11/15/2012)    Past Surgical History  Procedure Laterality Date  . Knee arthroscopy w/ meniscal repair Left 2009  . Lumbar disc surgery     . Squamous papilloma   2010    removed by Dr. Pollyann Kennedy ENT, noted on tongue  . Esophagogastroduodenoscopy N/A 09/27/2012    Procedure: ESOPHAGOGASTRODUODENOSCOPY (EGD);  Surgeon: Hart Carwin, MD;  Location: [facility];  Service: Endoscopy;  Laterality: N/A;  . Colonoscopy N/A 09/27/2012    Procedure: COLONOSCOPY;  Surgeon: Hart Carwin, MD;  Location: [facility];  Service: Endoscopy;  Laterality: N/A;  . Cataract extraction w/ intraocular lens implant Left   . Lymph node dissection Right 1970's    "neck; dr thought I had Hodgkins; turned out to be sarcoidosis" (11/15/2012)  . Tonsillectomy    . Carpal tunnel release Bilateral   . Cardiac catheterization      Family History  Problem Relation Age of Onset  . Alcohol abuse Mother   . Depression Mother   . Heart disease Mother   . Diabetes Mother   . Stroke Mother   . Diabetes Other     1/2 brother  . Hepatitis Brother     Social History:  reports that he has been smoking Cigarettes.  He has a 15 pack-year smoking history. He has never used smokeless tobacco. He reports that he drinks about 183.0 ounces of alcohol per week. He reports that he does not use  illicit drugs.  Allergies:  Allergies  Allergen Reactions  . Betamethasone Dipropionate     Unknown  . Bupropion Hcl     Unknown  . Ciprofloxacin     REACTION: swelling  . Clobetasol     Unknown  . Codeine     Unknown  . Escitalopram Oxalate     Unknown  . Flagyl [Metronidazole]     REACTION: swelling  . Fluoxetine Hcl     Unknown  . Fluticasone-Salmeterol     Unknown  . Furosemide     Unknown  . Immune Globulins     Acute renal failure  . Paroxetine     Unknown  . Penicillins     Unknown  . Statins     Unknown  . Sulfa Antibiotics Other (See Comments)    blisters  . Tacrolimus     Unknown  . Tetanus Toxoid     Unknown  . Tuberculin Purified Protein Derivative     Unknown    Medications:  I have reviewed the patient's current medications. Prior  to Admission:  Prescriptions prior to admission  Medication Sig Dispense Refill  . ALPRAZolam (XANAX) 1 MG tablet Take 1 mg by mouth at bedtime as needed for anxiety.      Marland Kitchen amiodarone (PACERONE) 200 MG tablet Take 1 tablet (200 mg total) by mouth daily before breakfast.  30 tablet  5  . aspirin EC 81 MG EC tablet Take 1 tablet (81 mg total) by mouth daily.      . collagenase (SANTYL) ointment Apply 1 application topically 3 (three) times daily as needed (for wound care).       . divalproex (DEPAKOTE ER) 500 MG 24 hr tablet Take 500 mg by mouth daily.      . divalproex (DEPAKOTE) 250 MG DR tablet Take 750 mg by mouth daily.      Marland Kitchen esomeprazole (NEXIUM) 40 MG capsule Take 40 mg by mouth daily before breakfast.      . Ipratropium-Albuterol (COMBIVENT) 20-100 MCG/ACT AERS respimat Inhale 1 puff into the lungs every 6 (six) hours.      . levETIRAcetam (KEPPRA) 500 MG tablet Take 500 mg by mouth daily.      . meloxicam (MOBIC) 15 MG tablet Take 1 tablet (15 mg total) by mouth daily.  30 tablet  0  . methotrexate (RHEUMATREX) 2.5 MG tablet Take 7.5 mg by mouth once a week. Caution:Chemotherapy. Protect from light.      . nitroGLYCERIN (NITROSTAT) 0.4 MG SL tablet Place 0.4 mg under the tongue every 5 (five) minutes as needed. For chest pain      . oxybutynin (DITROPAN) 5 MG tablet Take 5 mg by mouth daily.      . risperiDONE (RISPERDAL M-TABS) 1 MG disintegrating tablet Take 1 mg by mouth daily.      Marland Kitchen rOPINIRole (REQUIP) 2 MG tablet Take 2 mg by mouth daily.      Marland Kitchen torsemide (DEMADEX) 20 MG tablet Take 1 tablet (20 mg total) by mouth daily.  30 tablet  2   Scheduled: . sodium chloride   Intravenous STAT  . amiodarone  200 mg Oral Daily  . aspirin EC  81 mg Oral Daily  . heparin  5,000 Units Subcutaneous Q8H  . levETIRAcetam  500 mg Oral Daily  . oxybutynin  5 mg Oral Daily  . pantoprazole  80 mg Oral Q1200  . risperiDONE  1 mg Oral Daily  . rOPINIRole  2 mg Oral  Daily  . torsemide  20 mg  Oral Daily    Results for orders placed during the hospital encounter of 12/28/12 (from the past 48 hour(s))  BASIC METABOLIC PANEL     Status: Abnormal   Collection Time    12/28/12 11:59 AM      Result Value Range   Sodium 145  135 - 145 mEq/L   Potassium 3.3 (*) 3.5 - 5.1 mEq/L   Chloride 106  96 - 112 mEq/L   CO2 31  19 - 32 mEq/L   Glucose, Bld 83  70 - 99 mg/dL   BUN 15  6 - 23 mg/dL   Creatinine, Ser 1.61  0.50 - 1.35 mg/dL   Calcium 8.7  8.4 - 09.6 mg/dL   GFR calc non Af Amer 87 (*) >90 mL/min   GFR calc Af Amer >90  >90 mL/min   Comment: (NOTE)     The eGFR has been calculated using the CKD EPI equation.     This calculation has not been validated in all clinical situations.     eGFR's persistently <90 mL/min signify possible Chronic Kidney     Disease.  CBC     Status: Abnormal   Collection Time    12/28/12 11:59 AM      Result Value Range   WBC 5.1  4.0 - 10.5 K/uL   RBC 4.21 (*) 4.22 - 5.81 MIL/uL   Hemoglobin 13.6  13.0 - 17.0 g/dL   HCT 04.5  40.9 - 81.1 %   MCV 98.1  78.0 - 100.0 fL   MCH 32.3  26.0 - 34.0 pg   MCHC 32.9  30.0 - 36.0 g/dL   RDW 91.4 (*) 78.2 - 95.6 %   Platelets 189  150 - 400 K/uL  MAGNESIUM     Status: None   Collection Time    12/28/12 11:59 AM      Result Value Range   Magnesium 2.0  1.5 - 2.5 mg/dL  PRO B NATRIURETIC PEPTIDE     Status: Abnormal   Collection Time    12/28/12 11:59 AM      Result Value Range   Pro B Natriuretic peptide (BNP) 211.8 (*) 0 - 125 pg/mL  VALPROIC ACID LEVEL     Status: Abnormal   Collection Time    12/28/12 11:59 AM      Result Value Range   Valproic Acid Lvl 28.6 (*) 50.0 - 100.0 ug/mL  HEPATIC FUNCTION PANEL     Status: Abnormal   Collection Time    12/28/12 11:59 AM      Result Value Range   Total Protein 6.8  6.0 - 8.3 g/dL   Albumin 3.3 (*) 3.5 - 5.2 g/dL   AST 13  0 - 37 U/L   ALT 7  0 - 53 U/L   Alkaline Phosphatase 63  39 - 117 U/L   Total Bilirubin 0.6  0.3 - 1.2 mg/dL   Bilirubin,  Direct 0.1  0.0 - 0.3 mg/dL   Indirect Bilirubin 0.5  0.3 - 0.9 mg/dL  TROPONIN I     Status: None   Collection Time    12/28/12 11:59 AM      Result Value Range   Troponin I <0.30  <0.30 ng/mL   Comment:            Due to the release kinetics of cTnI,     a negative result within the first hours  of the onset of symptoms does not rule out     myocardial infarction with certainty.     If myocardial infarction is still suspected,     repeat the test at appropriate intervals.  POCT I-STAT TROPONIN I     Status: None   Collection Time    12/28/12 12:11 PM      Result Value Range   Troponin i, poc 0.00  0.00 - 0.08 ng/mL   Comment 3            Comment: Due to the release kinetics of cTnI,     a negative result within the first hours     of the onset of symptoms does not rule out     myocardial infarction with certainty.     If myocardial infarction is still suspected,     repeat the test at appropriate intervals.  GLUCOSE, CAPILLARY     Status: Abnormal   Collection Time    12/28/12  5:45 PM      Result Value Range   Glucose-Capillary 118 (*) 70 - 99 mg/dL  MRSA PCR SCREENING     Status: None   Collection Time    12/28/12  6:15 PM      Result Value Range   MRSA by PCR NEGATIVE  NEGATIVE   Comment:            The GeneXpert MRSA Assay (FDA     approved for NASAL specimens     only), is one component of a     comprehensive MRSA colonization     surveillance program. It is not     intended to diagnose MRSA     infection nor to guide or     monitor treatment for     MRSA infections.  TROPONIN I     Status: None   Collection Time    12/28/12  6:43 PM      Result Value Range   Troponin I <0.30  <0.30 ng/mL   Comment:            Due to the release kinetics of cTnI,     a negative result within the first hours     of the onset of symptoms does not rule out     myocardial infarction with certainty.     If myocardial infarction is still suspected,     repeat the test at  appropriate intervals.  TROPONIN I     Status: None   Collection Time    12/29/12 12:11 AM      Result Value Range   Troponin I <0.30  <0.30 ng/mL   Comment:            Due to the release kinetics of cTnI,     a negative result within the first hours     of the onset of symptoms does not rule out     myocardial infarction with certainty.     If myocardial infarction is still suspected,     repeat the test at appropriate intervals.  TROPONIN I     Status: None   Collection Time    12/29/12  4:25 AM      Result Value Range   Troponin I <0.30  <0.30 ng/mL   Comment:            Due to the release kinetics of cTnI,     a negative result within the first hours     of  the onset of symptoms does not rule out     myocardial infarction with certainty.     If myocardial infarction is still suspected,     repeat the test at appropriate intervals.  CBC     Status: Abnormal   Collection Time    12/29/12  4:25 AM      Result Value Range   WBC 5.4  4.0 - 10.5 K/uL   RBC 3.98 (*) 4.22 - 5.81 MIL/uL   Hemoglobin 12.7 (*) 13.0 - 17.0 g/dL   HCT 13.0  86.5 - 78.4 %   MCV 98.7  78.0 - 100.0 fL   MCH 31.9  26.0 - 34.0 pg   MCHC 32.3  30.0 - 36.0 g/dL   RDW 69.6 (*) 29.5 - 28.4 %   Platelets 193  150 - 400 K/uL  BASIC METABOLIC PANEL     Status: Abnormal   Collection Time    12/29/12  4:25 AM      Result Value Range   Sodium 145  135 - 145 mEq/L   Potassium 3.5  3.5 - 5.1 mEq/L   Chloride 109  96 - 112 mEq/L   CO2 31  19 - 32 mEq/L   Glucose, Bld 80  70 - 99 mg/dL   BUN 14  6 - 23 mg/dL   Creatinine, Ser 1.32  0.50 - 1.35 mg/dL   Calcium 8.2 (*) 8.4 - 10.5 mg/dL   GFR calc non Af Amer 88 (*) >90 mL/min   GFR calc Af Amer >90  >90 mL/min   Comment: (NOTE)     The eGFR has been calculated using the CKD EPI equation.     This calculation has not been validated in all clinical situations.     eGFR's persistently <90 mL/min signify possible Chronic Kidney     Disease.  LIPID PANEL      Status: None   Collection Time    12/29/12  4:25 AM      Result Value Range   Cholesterol 157  0 - 200 mg/dL   Triglycerides 91  <440 mg/dL   HDL 51  >10 mg/dL   Total CHOL/HDL Ratio 3.1     VLDL 18  0 - 40 mg/dL   LDL Cholesterol 88  0 - 99 mg/dL   Comment:            Total Cholesterol/HDL:CHD Risk     Coronary Heart Disease Risk Table                         Men   Women      1/2 Average Risk   3.4   3.3      Average Risk       5.0   4.4      2 X Average Risk   9.6   7.1      3 X Average Risk  23.4   11.0                Use the calculated Patient Ratio     above and the CHD Risk Table     to determine the patient's CHD Risk.                ATP III CLASSIFICATION (LDL):      <100     mg/dL   Optimal      272-536  mg/dL   Near or Above  Optimal      130-159  mg/dL   Borderline      161-096  mg/dL   High      >045     mg/dL   Very High    Dg Chest Port 1 View  12/28/2012   CLINICAL DATA:  Chest pain.  Atrial fibrillation.  EXAM: PORTABLE CHEST - 1 VIEW  COMPARISON:  11/08/2012  FINDINGS: There is slight new pulmonary vascular congestion. Minimal linear atelectasis at the left base. No effusions or lung consolidation. No acute osseous abnormality.  IMPRESSION: New pulmonary vascular congestion.  Minimal left base atelectasis.   Electronically Signed   By: Geanie Cooley M.D.   On: 12/28/2012 12:27    ROS Blood pressure 158/93, pulse 72, temperature 98 F (36.7 C), temperature source Oral, resp. rate 18, height 5\' 3"  (1.6 m), weight 94.2 kg (207 lb 10.8 oz), SpO2 96.00%. Physical Exam  Labs reviewed above ECG unchanged from previous - atrial fibrillation earlier in evening then repeat with conversion to NSR Echo reviewed; 9/14 - EF 60-65%, moderately dilated LA, PASP 34 mmHg, mild AS  Problem List Chest Pain  Atrial fibrillation COPD GERD CAD  Assessment/Plan: 62 yo man with PMH of atrial fibrillation, CAD who had 30 minutes of chest pain in his  physician's office; however, he has been tolerated walking with a walker well most days of the week. This CP appears to most likely be related to atrial fibrillation with potentially a demand component; however, his cardiac markers are negative. He is currently chest pain free. He is on amiodarone for rate/rhythm control but I do not see an anticoagulant. Will defer final review to the day cardiology team but from what I can gather, this is non obstructive coronary artery disease related however a cardiac stress imaging test would not be unreasonable.    Patrick Holt 12/29/2012, 6:26 AM

## 2012-12-30 ENCOUNTER — Other Ambulatory Visit: Payer: Self-pay

## 2012-12-30 ENCOUNTER — Emergency Department (HOSPITAL_COMMUNITY): Payer: Medicare HMO

## 2012-12-30 ENCOUNTER — Encounter (HOSPITAL_COMMUNITY): Payer: Self-pay | Admitting: Emergency Medicine

## 2012-12-30 ENCOUNTER — Emergency Department (HOSPITAL_COMMUNITY)
Admission: EM | Admit: 2012-12-30 | Discharge: 2012-12-30 | Disposition: A | Discharge status: Home / Self Care | Payer: Medicare HMO | Attending: Emergency Medicine | Admitting: Emergency Medicine

## 2012-12-30 DIAGNOSIS — R109 Unspecified abdominal pain: Secondary | ICD-10-CM | POA: Insufficient documentation

## 2012-12-30 DIAGNOSIS — IMO0001 Reserved for inherently not codable concepts without codable children: Secondary | ICD-10-CM | POA: Insufficient documentation

## 2012-12-30 DIAGNOSIS — Z79899 Other long term (current) drug therapy: Secondary | ICD-10-CM | POA: Insufficient documentation

## 2012-12-30 DIAGNOSIS — Z862 Personal history of diseases of the blood and blood-forming organs and certain disorders involving the immune mechanism: Secondary | ICD-10-CM | POA: Insufficient documentation

## 2012-12-30 DIAGNOSIS — R209 Unspecified disturbances of skin sensation: Secondary | ICD-10-CM | POA: Insufficient documentation

## 2012-12-30 DIAGNOSIS — G40909 Epilepsy, unspecified, not intractable, without status epilepticus: Secondary | ICD-10-CM | POA: Insufficient documentation

## 2012-12-30 DIAGNOSIS — M25519 Pain in unspecified shoulder: Secondary | ICD-10-CM | POA: Insufficient documentation

## 2012-12-30 DIAGNOSIS — F3130 Bipolar disorder, current episode depressed, mild or moderate severity, unspecified: Secondary | ICD-10-CM | POA: Insufficient documentation

## 2012-12-30 DIAGNOSIS — E669 Obesity, unspecified: Secondary | ICD-10-CM | POA: Insufficient documentation

## 2012-12-30 DIAGNOSIS — I252 Old myocardial infarction: Secondary | ICD-10-CM | POA: Insufficient documentation

## 2012-12-30 DIAGNOSIS — F419 Anxiety disorder, unspecified: Secondary | ICD-10-CM

## 2012-12-30 DIAGNOSIS — J449 Chronic obstructive pulmonary disease, unspecified: Secondary | ICD-10-CM | POA: Insufficient documentation

## 2012-12-30 DIAGNOSIS — Z7982 Long term (current) use of aspirin: Secondary | ICD-10-CM | POA: Insufficient documentation

## 2012-12-30 DIAGNOSIS — E119 Type 2 diabetes mellitus without complications: Secondary | ICD-10-CM | POA: Insufficient documentation

## 2012-12-30 DIAGNOSIS — F17200 Nicotine dependence, unspecified, uncomplicated: Secondary | ICD-10-CM | POA: Insufficient documentation

## 2012-12-30 DIAGNOSIS — F411 Generalized anxiety disorder: Secondary | ICD-10-CM | POA: Insufficient documentation

## 2012-12-30 DIAGNOSIS — I10 Essential (primary) hypertension: Secondary | ICD-10-CM | POA: Insufficient documentation

## 2012-12-30 DIAGNOSIS — I4891 Unspecified atrial fibrillation: Secondary | ICD-10-CM | POA: Insufficient documentation

## 2012-12-30 DIAGNOSIS — M069 Rheumatoid arthritis, unspecified: Secondary | ICD-10-CM | POA: Insufficient documentation

## 2012-12-30 DIAGNOSIS — R0789 Other chest pain: Secondary | ICD-10-CM

## 2012-12-30 DIAGNOSIS — Z791 Long term (current) use of non-steroidal anti-inflammatories (NSAID): Secondary | ICD-10-CM | POA: Insufficient documentation

## 2012-12-30 DIAGNOSIS — Z860100 Personal history of colon polyps, unspecified: Secondary | ICD-10-CM | POA: Insufficient documentation

## 2012-12-30 DIAGNOSIS — K219 Gastro-esophageal reflux disease without esophagitis: Secondary | ICD-10-CM | POA: Insufficient documentation

## 2012-12-30 DIAGNOSIS — Z880 Allergy status to penicillin: Secondary | ICD-10-CM | POA: Insufficient documentation

## 2012-12-30 DIAGNOSIS — E785 Hyperlipidemia, unspecified: Secondary | ICD-10-CM | POA: Insufficient documentation

## 2012-12-30 DIAGNOSIS — Z8619 Personal history of other infectious and parasitic diseases: Secondary | ICD-10-CM | POA: Insufficient documentation

## 2012-12-30 DIAGNOSIS — Z87442 Personal history of urinary calculi: Secondary | ICD-10-CM | POA: Insufficient documentation

## 2012-12-30 DIAGNOSIS — J4489 Other specified chronic obstructive pulmonary disease: Secondary | ICD-10-CM | POA: Insufficient documentation

## 2012-12-30 LAB — BASIC METABOLIC PANEL
BUN: 15 mg/dL (ref 6–23)
CO2: 27 mEq/L (ref 19–32)
Calcium: 9 mg/dL (ref 8.4–10.5)
Chloride: 105 mEq/L (ref 96–112)
Creatinine, Ser: 0.9 mg/dL (ref 0.50–1.35)
GFR calc Af Amer: >90 mL/min (ref >90)
GFR calc non Af Amer: 89 mL/min — ABNORMAL LOW (ref >90)
Glucose, Bld: 90 mg/dL (ref 70–99)
Potassium: 3.5 mEq/L (ref 3.5–5.1)
Sodium: 142 mEq/L (ref 135–145)

## 2012-12-30 LAB — CBC
HCT: 41.3 % (ref 39.0–52.0)
Hemoglobin: 13.8 g/dL (ref 13.0–17.0)
MCH: 32 pg (ref 26.0–34.0)
MCHC: 33.4 g/dL (ref 30.0–36.0)
MCV: 95.8 fL (ref 78.0–100.0)
Platelets: 227 10*3/uL (ref 150–400)
RBC: 4.31 MIL/uL (ref 4.22–5.81)
RDW: 17.6 % — ABNORMAL HIGH (ref 11.5–15.5)
WBC: 7 10*3/uL (ref 4.0–10.5)

## 2012-12-30 LAB — PRO B NATRIURETIC PEPTIDE: Pro B Natriuretic peptide (BNP): 280.7 pg/mL — ABNORMAL HIGH (ref 0–125)

## 2012-12-30 LAB — TROPONIN I
Troponin I: <0.3 ng/mL (ref <0.30)
Troponin I: <0.3 ng/mL (ref <0.30)

## 2012-12-30 MED ORDER — MORPHINE SULFATE 4 MG/ML IJ SOLN
4.0000 mg | Freq: Once | INTRAMUSCULAR | Status: DC
  Filled 2012-12-30: qty 1

## 2012-12-30 MED ORDER — OXYCODONE-ACETAMINOPHEN 5-325 MG PO TABS: 1.0000 | ORAL_TABLET | Freq: Four times a day (QID) | ORAL | Status: DC | PRN

## 2012-12-30 MED ORDER — KETOROLAC TROMETHAMINE 30 MG/ML IJ SOLN
30.0000 mg | Freq: Once | INTRAMUSCULAR | Status: AC
  Administered 2012-12-30: 30 mg via INTRAVENOUS
  Filled 2012-12-30: qty 1

## 2012-12-30 MED ORDER — MORPHINE SULFATE 4 MG/ML IJ SOLN
6.0000 mg | Freq: Once | INTRAMUSCULAR | Status: AC
  Administered 2012-12-30: 6 mg via INTRAVENOUS
  Filled 2012-12-30: qty 2

## 2012-12-30 MED ORDER — OXYCODONE-ACETAMINOPHEN 5-325 MG PO TABS
1.0000 | ORAL_TABLET | Freq: Once | ORAL | Status: AC
  Administered 2012-12-30: 1 via ORAL
  Filled 2012-12-30: qty 1

## 2012-12-30 NOTE — Discharge Summary (Signed)
Attending Addendum  I examined the patient and discussed the assessment and plan with Dr. Nettey. I have reviewed the note and agree.    Robin Petrakis, MD [facility]    

## 2012-12-30 NOTE — ED Provider Notes (Signed)
CSN: [id number]     Arrival date & time 12/30/12  1008 History   First MD Initiated Contact with Patient 12/30/12 1023     Chief Complaint  Patient presents with  . Chest Pain   (Consider location/radiation/quality/duration/timing/severity/associated sxs/prior Treatment) HPI Patient presents to the emergency department with left-sided chest discomfort and he states he has tingling and numbness in his left arm and left neck.  Patient also complains of pain in the left groin area and his right clavicle.  Patient, states, that he was recently discharged from the hospital.  He states last night.  He started having chest discomfort around 10 PM.  Patient, states, that she's not had any shortness of breath, nausea, vomiting, abdominal pain, diarrhea, headache, blurred vision, back pain, weakness, numbness, dizziness, fever, cough, or syncope.  Patient, states he did not take any medications prior to arrival for his discomfort.  Patient, states nothing seems make his condition, better or worse Past Medical History  Diagnosis Date  . Rheumatoid arthritis(714.0)   . Obesity   . Major depression     with acute psychotic break in 06/2010  . Hypertension   . Hyperlipidemia   . Diverticulosis of colon   . COPD (chronic obstructive pulmonary disease)   . Anxiety   . GERD (gastroesophageal reflux disease)   . Vertigo   . Fibromyalgia   . Dermatomyositis   . Myocardial infarct     mulitple (1999, 2000, 2004)  . Raynaud's disease   . Narcotic dependence   . Peripheral neuropathy   . Internal hemorrhoids   . Ischemic heart disease   . Hiatal hernia   . Gastritis   . Diverticulitis   . Hx of adenomatous colonic polyps   . Nephrolithiasis   . Anemia   . Esophageal stricture   . Esophageal dysmotility   . Dermatomyositis   . Urge incontinence   . Otosclerosis   . Bipolar 1 disorder   . OCD (obsessive compulsive disorder)   . Sarcoidosis   . Paroxysmal a-fib   . Dysrhythmia     "irregular"  (11/15/2012)  . Type II diabetes mellitus   . Seizures   . Headache(784.0)     "severe; get them often" (11/15/2012)   Past Surgical History  Procedure Laterality Date  . Knee arthroscopy w/ meniscal repair Left 2009  . Lumbar disc surgery    . Squamous papilloma   2010    removed by Dr. Pollyann Kennedy ENT, noted on tongue  . Esophagogastroduodenoscopy N/A 09/27/2012    Procedure: ESOPHAGOGASTRODUODENOSCOPY (EGD);  Surgeon: Hart Carwin, MD;  Location: [facility];  Service: Endoscopy;  Laterality: N/A;  . Colonoscopy N/A 09/27/2012    Procedure: COLONOSCOPY;  Surgeon: Hart Carwin, MD;  Location: [facility];  Service: Endoscopy;  Laterality: N/A;  . Cataract extraction w/ intraocular lens implant Left   . Lymph node dissection Right 1970's    "neck; dr thought I had Hodgkins; turned out to be sarcoidosis" (11/15/2012)  . Tonsillectomy    . Carpal tunnel release Bilateral   . Cardiac catheterization     Family History  Problem Relation Age of Onset  . Alcohol abuse Mother   . Depression Mother   . Heart disease Mother   . Diabetes Mother   . Stroke Mother   . Diabetes Other     1/2 brother  . Hepatitis Brother    History  Substance Use Topics  . Smoking status: Current Every Day Smoker -- 0.50 packs/day for  30 years    Types: Cigarettes  . Smokeless tobacco: Never Used  . Alcohol Use: 183.0 oz/week    305 Glasses of wine per week     Comment: 11/15/2012 "1/2 glass of red wine ~ qd"    Review of Systems All other systems negative except as documented in the HPI. All pertinent positives and negatives as reviewed in the HPI. Allergies  Betamethasone dipropionate; Bupropion hcl; Ciprofloxacin; Clobetasol; Codeine; Escitalopram oxalate; Flagyl; Fluoxetine hcl; Fluticasone-salmeterol; Furosemide; Immune globulins; Paroxetine; Penicillins; Statins; Sulfa antibiotics; Tacrolimus; Tetanus toxoid; and Tuberculin purified protein derivative  Home Medications   Current Outpatient Rx   Name  Route  Sig  Dispense  Refill  . ALPRAZolam (XANAX) 1 MG tablet   Oral   Take 1 mg by mouth at bedtime as needed for anxiety.         Marland Kitchen amiodarone (PACERONE) 200 MG tablet   Oral   Take 1 tablet (200 mg total) by mouth daily before breakfast.   30 tablet   5   . aspirin EC 81 MG EC tablet   Oral   Take 1 tablet (81 mg total) by mouth daily.         . divalproex (DEPAKOTE ER) 500 MG 24 hr tablet   Oral   Take 1 tablet (500 mg total) by mouth 2 (two) times daily.   60 tablet   2   . esomeprazole (NEXIUM) 40 MG capsule   Oral   Take 40 mg by mouth daily before breakfast.         . Ipratropium-Albuterol (COMBIVENT) 20-100 MCG/ACT AERS respimat   Inhalation   Inhale 1 puff into the lungs every 6 (six) hours.         . levETIRAcetam (KEPPRA) 500 MG tablet   Oral   Take 500 mg by mouth daily.         Marland Kitchen lisinopril (PRINIVIL,ZESTRIL) 10 MG tablet   Oral   Take 1 tablet (10 mg total) by mouth daily.   30 tablet   3   . lovastatin (MEVACOR) 40 MG tablet   Oral   Take 1 tablet (40 mg total) by mouth at bedtime.   30 tablet   0   . meloxicam (MOBIC) 15 MG tablet   Oral   Take 1 tablet (15 mg total) by mouth daily.   30 tablet   0   . methotrexate (RHEUMATREX) 2.5 MG tablet   Oral   Take 7.5 mg by mouth once a week. Caution:Chemotherapy. Protect from light.         . Multiple Vitamin (MULTIVITAMIN WITH MINERALS) TABS tablet   Oral   Take 1 tablet by mouth daily.   90 tablet   3   . nitroGLYCERIN (NITROSTAT) 0.4 MG SL tablet   Sublingual   Place 0.4 mg under the tongue every 5 (five) minutes as needed. For chest pain         . oxybutynin (DITROPAN) 5 MG tablet   Oral   Take 5 mg by mouth daily.         . risperiDONE (RISPERDAL M-TABS) 1 MG disintegrating tablet   Oral   Take 1 mg by mouth daily.         Marland Kitchen rOPINIRole (REQUIP) 2 MG tablet   Oral   Take 2 mg by mouth daily.         Marland Kitchen torsemide (DEMADEX) 20 MG tablet   Oral   Take  1 tablet (20  mg total) by mouth daily.   30 tablet   2    BP 129/94  Pulse 61  Temp(Src) 98.1 F (36.7 C) (Oral)  Resp 16  SpO2 98% Physical Exam  Nursing note and vitals reviewed. Constitutional: He is oriented to person, place, and time. He appears well-developed and well-nourished. No distress.  HENT:  Head: Normocephalic and atraumatic.  Mouth/Throat: Oropharynx is clear and moist.  Eyes: Pupils are equal, round, and reactive to light.  Neck: Normal range of motion. Neck supple.  Cardiovascular: Normal rate, regular rhythm and normal heart sounds.  Exam reveals no gallop and no friction rub.   No murmur heard. Pulmonary/Chest: Effort normal and breath sounds normal. No respiratory distress.  Abdominal: Soft. Bowel sounds are normal. He exhibits no distension. There is no tenderness.  Neurological: He is alert and oriented to person, place, and time. He exhibits normal muscle tone. Coordination normal.  Skin: Skin is warm and dry. No rash noted.    ED Course  Procedures (including critical care time) Labs Review Labs Reviewed  CBC  BASIC METABOLIC PANEL  TROPONIN I   Imaging Review Dg Chest Port 1 View  12/28/2012   CLINICAL DATA:  Chest pain.  Atrial fibrillation.  EXAM: PORTABLE CHEST - 1 VIEW  COMPARISON:  11/08/2012  FINDINGS: There is slight new pulmonary vascular congestion. Minimal linear atelectasis at the left base. No effusions or lung consolidation. No acute osseous abnormality.  IMPRESSION: New pulmonary vascular congestion.  Minimal left base atelectasis.   Electronically Signed   By: Geanie Cooley M.D.   On: 12/28/2012 12:27   I reviewed the patient's visit and admission to the hospital.  Patient is complaining of multiple complaints today, along with several social issues.  We'll recheck laboratory testing, and chest x-ray.  Patient is explained the expected course here in the emergency department.  Patient has been stable here in the emergency department.   He is sitting in his room watching TV without complaint and then starts complaining about his chest pain once he is asked how he is feeling at this time.  The patient will be asked to follow up with his cardiologist Monday as scheduled  The patient has been seen by the attending Physician.   Date: 12/30/2012  Rate:59  Rhythm: normal sinus rhythm  QRS Axis: normal  Intervals: normal  ST/T Wave abnormalities: normal  Conduction Disutrbances:right bundle branch block  Narrative Interpretation:   Old EKG Reviewed: unchanged    Carlyle Dolly, PA-C 12/30/12 1617

## 2012-12-30 NOTE — ED Notes (Signed)
PTAR at bedside to transport patient 

## 2012-12-30 NOTE — ED Notes (Signed)
Pt had episode of bradycardia down to 32bpm. Pt was resting during this episode, upon waking patient, heart rate came up to between 48-60bpm. Pt now alert, oriented, and interactive in NAD. Thayer Ohm [place] and Dr. Romeo Apple made aware of episode.

## 2012-12-30 NOTE — ED Notes (Signed)
Per ems, hx of admission three days ago, was discharged last night, dx with dysrhythmia.  CP started 9pm last night. Radiating to left axillary radiating to left arm and neck. 324 asa, 1 nitro, no relief.
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
## 2012-12-30 NOTE — ED Notes (Addendum)
Spoke to patient at length about home situation, mobility, etc. Pt states he has a wheelchair and walker at home and is able to call [facility] and/or a cab for transportation when at home. Pt states he does not want to speak with social worker at this time but would accept a resource guide. Pt given a resource guide with various telephone numbers/services circled for patient. PT awaiting [facility] for transport

## 2012-12-30 NOTE — ED Provider Notes (Signed)
Medical screening examination/treatment/procedure(s) were conducted as a shared visit with non-physician practitioner(s) and myself.  I personally evaluated the patient during the encounter.   I interviewed and examined the patient. Lungs are CTAB. Cardiac exam wnl. Abdomen soft.  Pt recently admitted for rule out, planned outpt stress test. Pt tearful on exam. He agrees that their is likely an anxiety component to his sx. I offered admission, but I felt it was also reasonable for him to f/u at his outpt stress test given a neg delta trop in the ED. He agrees. Will d/c home.     Junius Argyle, MD 12/30/12 2040

## 2012-12-31 IMAGING — CR DG CHEST 2V
1 series · 1 of 1 positions shown · non-contrast
Comparison: 07/19/2009

CLINICAL DATA: Confusion, diabetes, hypertension, history smoking,
MI

CHEST - 2 VIEW

[view not recorded]
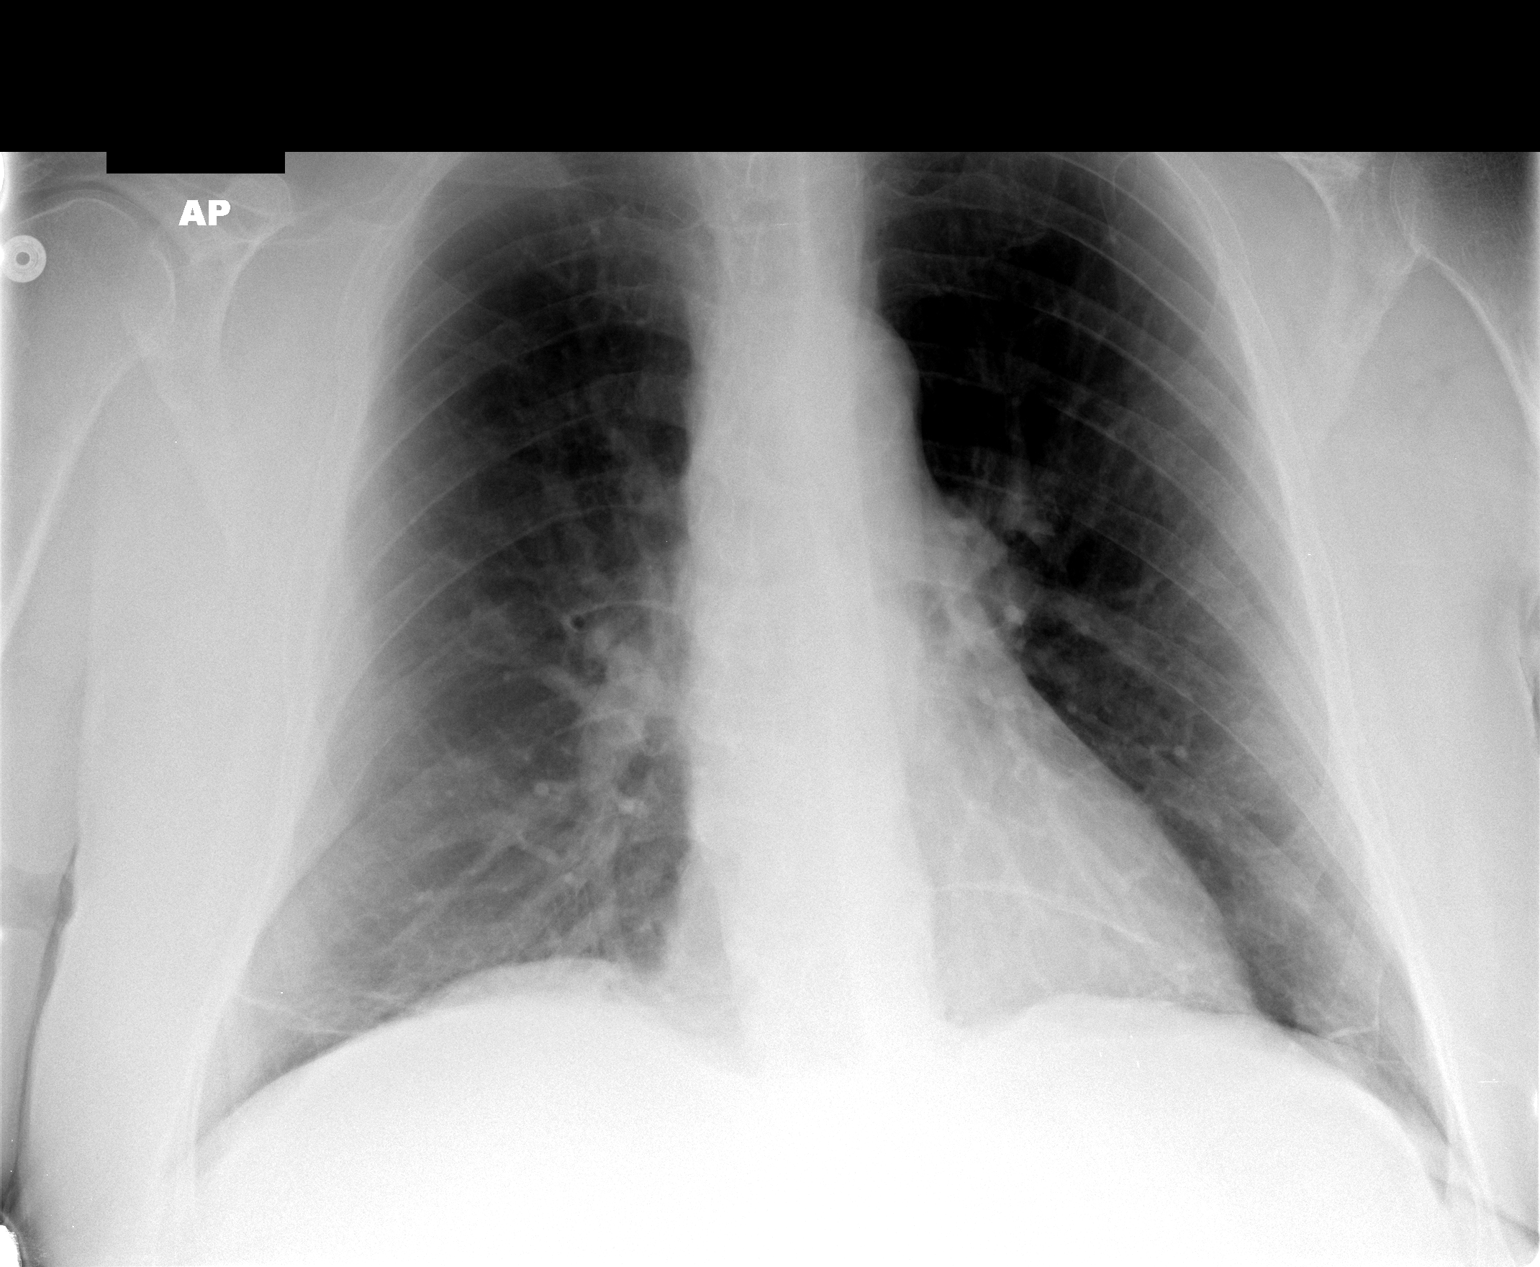

[1 of 1 positions shown; findings below may reference images not displayed]

FINDINGS: Upper normal heart size.
Normal mediastinal contours and pulmonary vascularity.
Emphysematous and minimal bronchitic changes.
Minimal bibasilar atelectasis.
Lungs otherwise clear.
No pleural effusion or pneumothorax.
Bones demineralized with scattered end plate spur formation
thoracic spine.
IMPRESSION: Emphysematous and bronchitic changes.
Minimal bibasilar atelectasis.

## 2012-12-31 IMAGING — CT CT HEAD W/O CM
1 series · 16 of 30 positions shown, 20 images · non-contrast
Comparison: CT head without contrast 07/04/2010

CLINICAL DATA: Confusion and paranoia.  Altered mental status.

CT HEAD WITHOUT CONTRAST
TECHNIQUE: Contiguous axial images were obtained from the base of
the skull through the vertex without contrast.

[Series 2: head routine 4.8 h37s · axial · 0.43mm/px · z∈[-153,+7]mm · 16 of 36 slices shown, 20 images]
[im 2/36  brain]
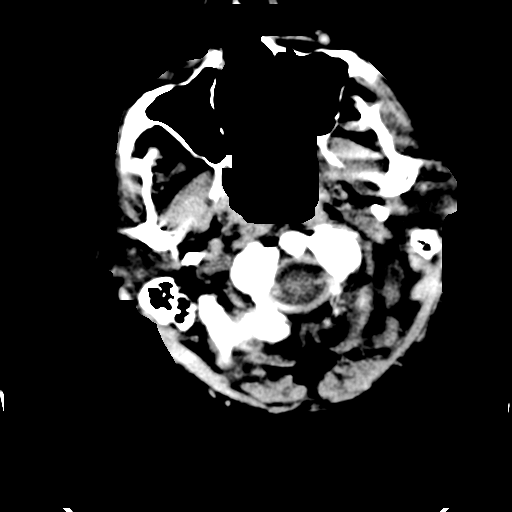
[im 2/36  bone]
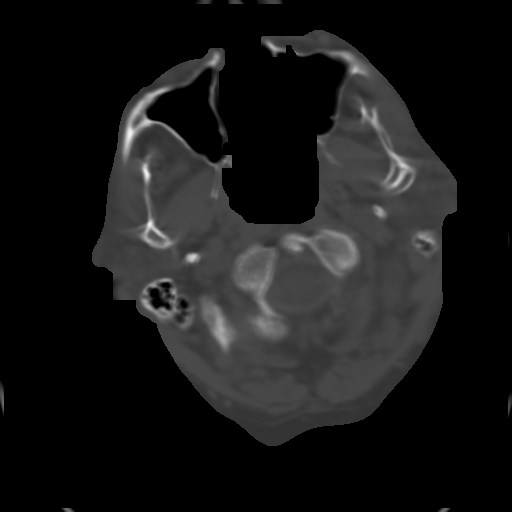
[im 4/36  brain]
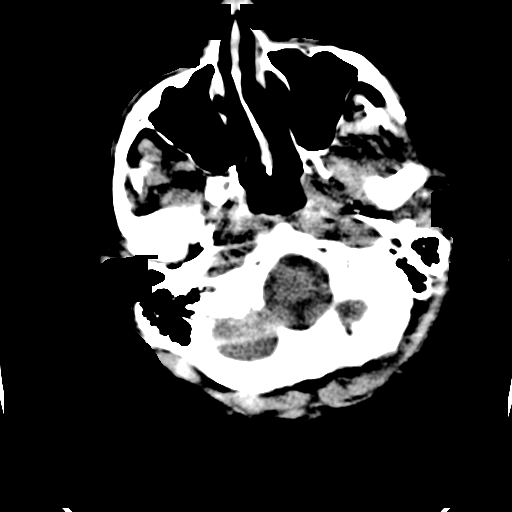
[im 7/36  brain]
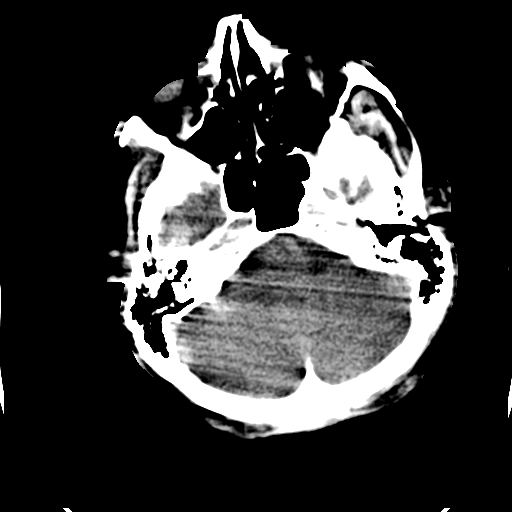
[im 9/36  brain]
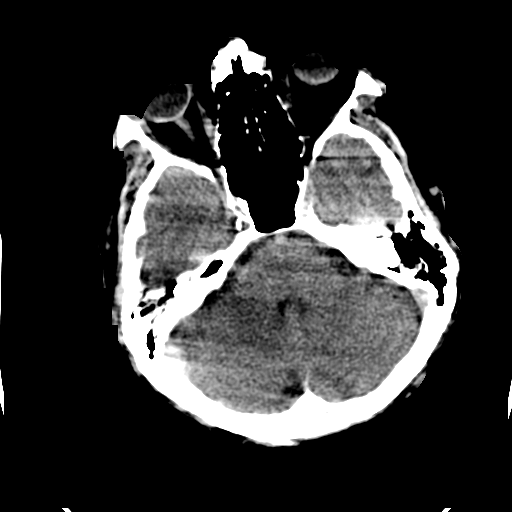
[im 10/36  brain]
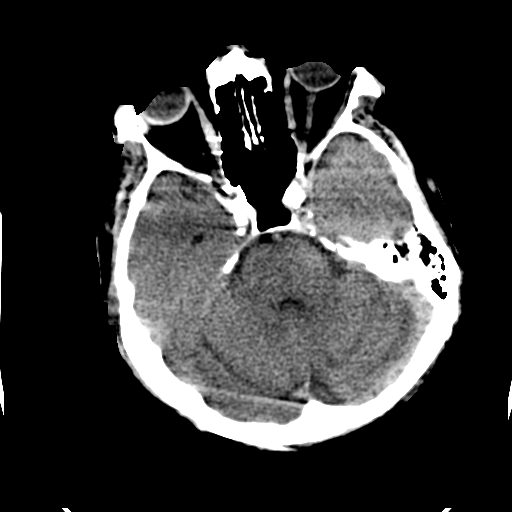
[im 10/36  bone]
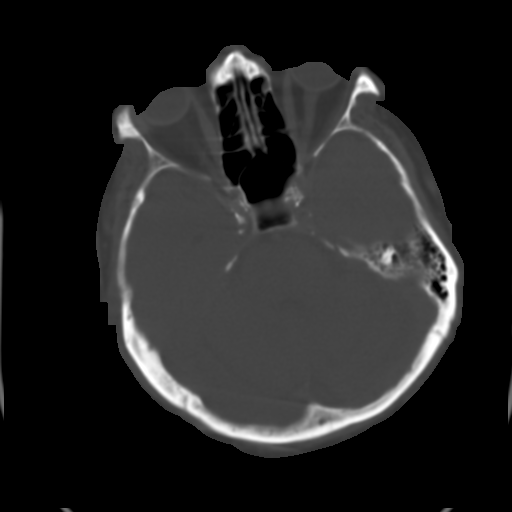
[im 13/36  brain]
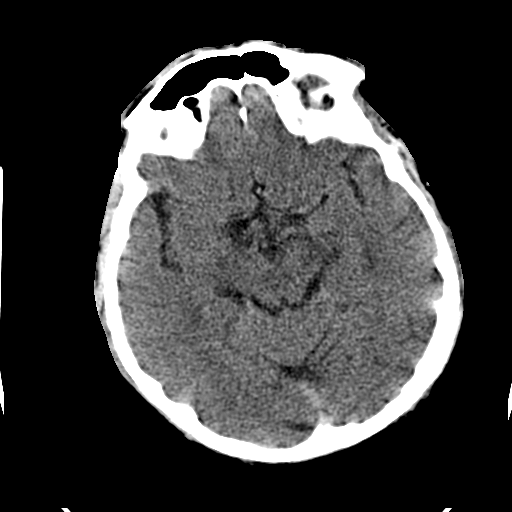
[im 15/36  brain]
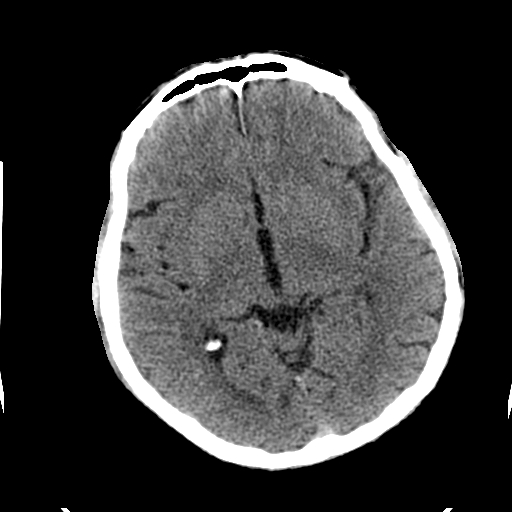
[im 17/36  brain]
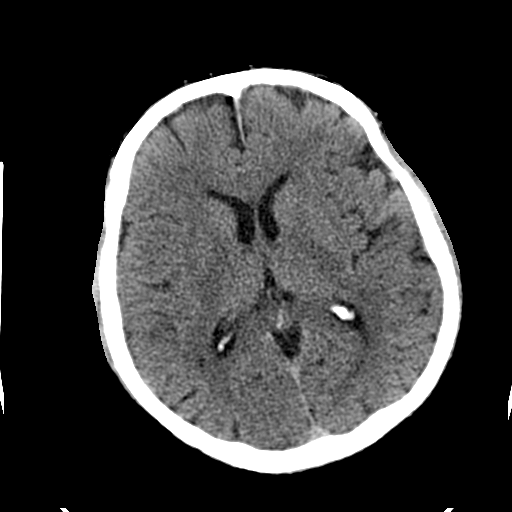
[im 19/36  brain]
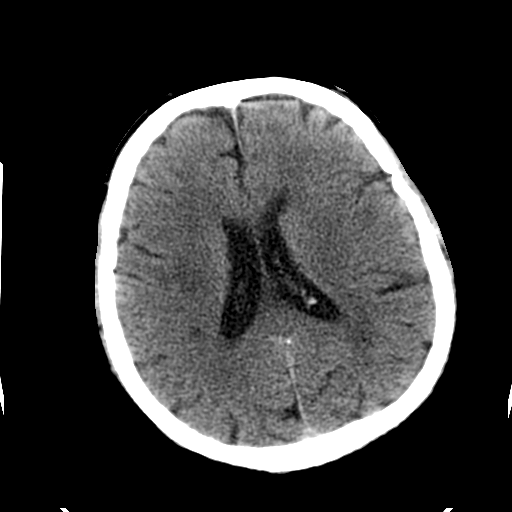
[im 19/36  bone]
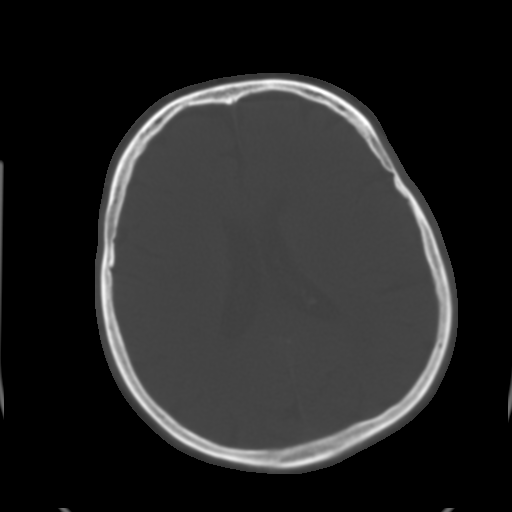
[im 21/36  brain]
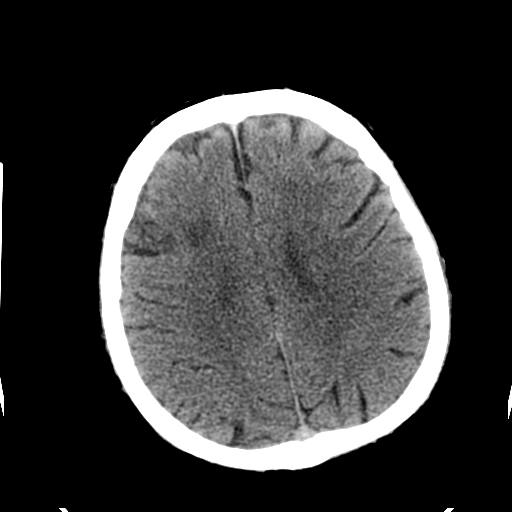
[im 23/36  brain]
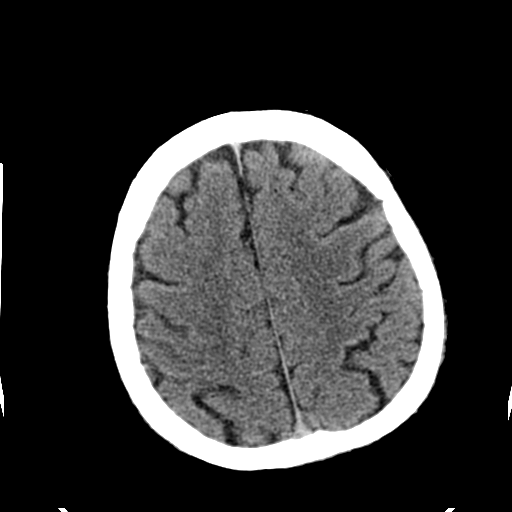
[im 26/36  brain]
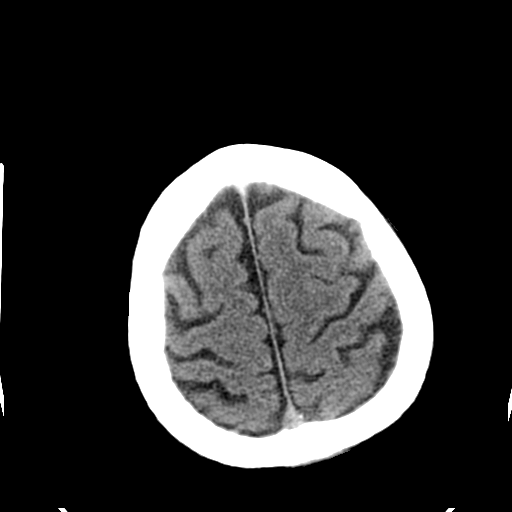
[im 27/36  brain]
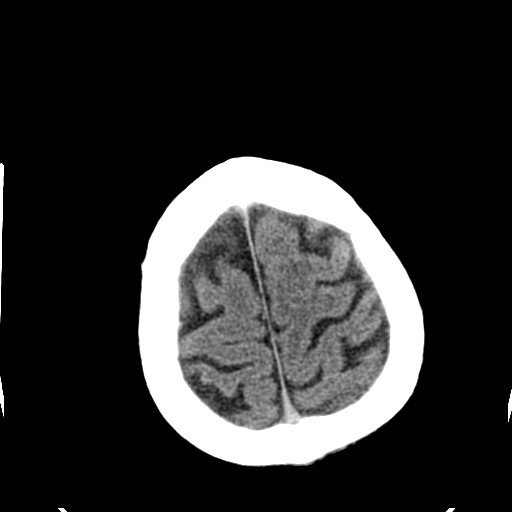
[im 27/36  bone]
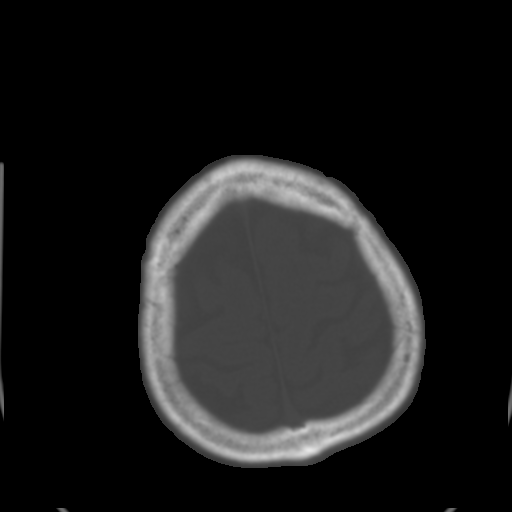
[im 29/36  brain]
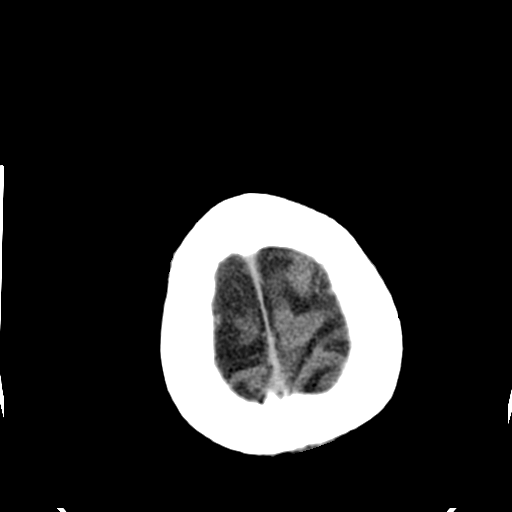
[im 32/36  brain]
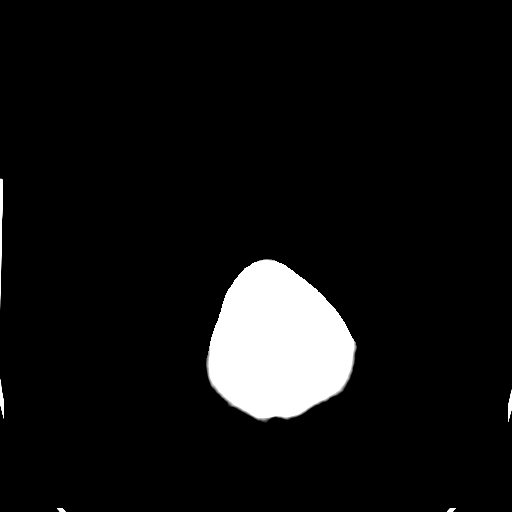
[im 34/36  brain]
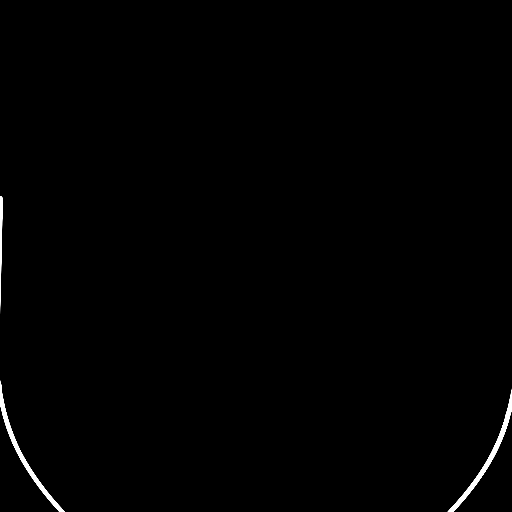

[16 of 30 positions shown; findings below may reference images not displayed]

FINDINGS: Patchy subcortical white matter hypoattenuation is
stable.  The study is mildly degraded by patient motion.  No acute
cortical infarct, hemorrhage, mass lesion is present.

The paranasal sinuses and mastoid air cells are clear.
Atherosclerotic calcifications are noted in the cavernous carotid
arteries.  The osseous skull is intact.
IMPRESSION: 1.  Stable atrophy white matter disease, likely reflecting chronic
microvascular ischemia.
2.  No acute intracranial abnormality.
3.  Atherosclerosis.

## 2012-12-31 NOTE — ED Provider Notes (Signed)
Medical screening examination/treatment/procedure(s) were conducted as a shared visit with non-physician practitioner(s) and myself.  I personally evaluated the patient during the encounter. Please see my previous note.       Laray Anger, DO 12/31/12 [phone]

## 2013-01-01 IMAGING — CR DG ANKLE COMPLETE 3+V*L*
3 series · 3 of 3 positions shown · non-contrast
Comparison: None.

CLINICAL DATA: Fall, pain.

LEFT ANKLE COMPLETE - 3+ VIEW

[view not recorded (1 of 3)]
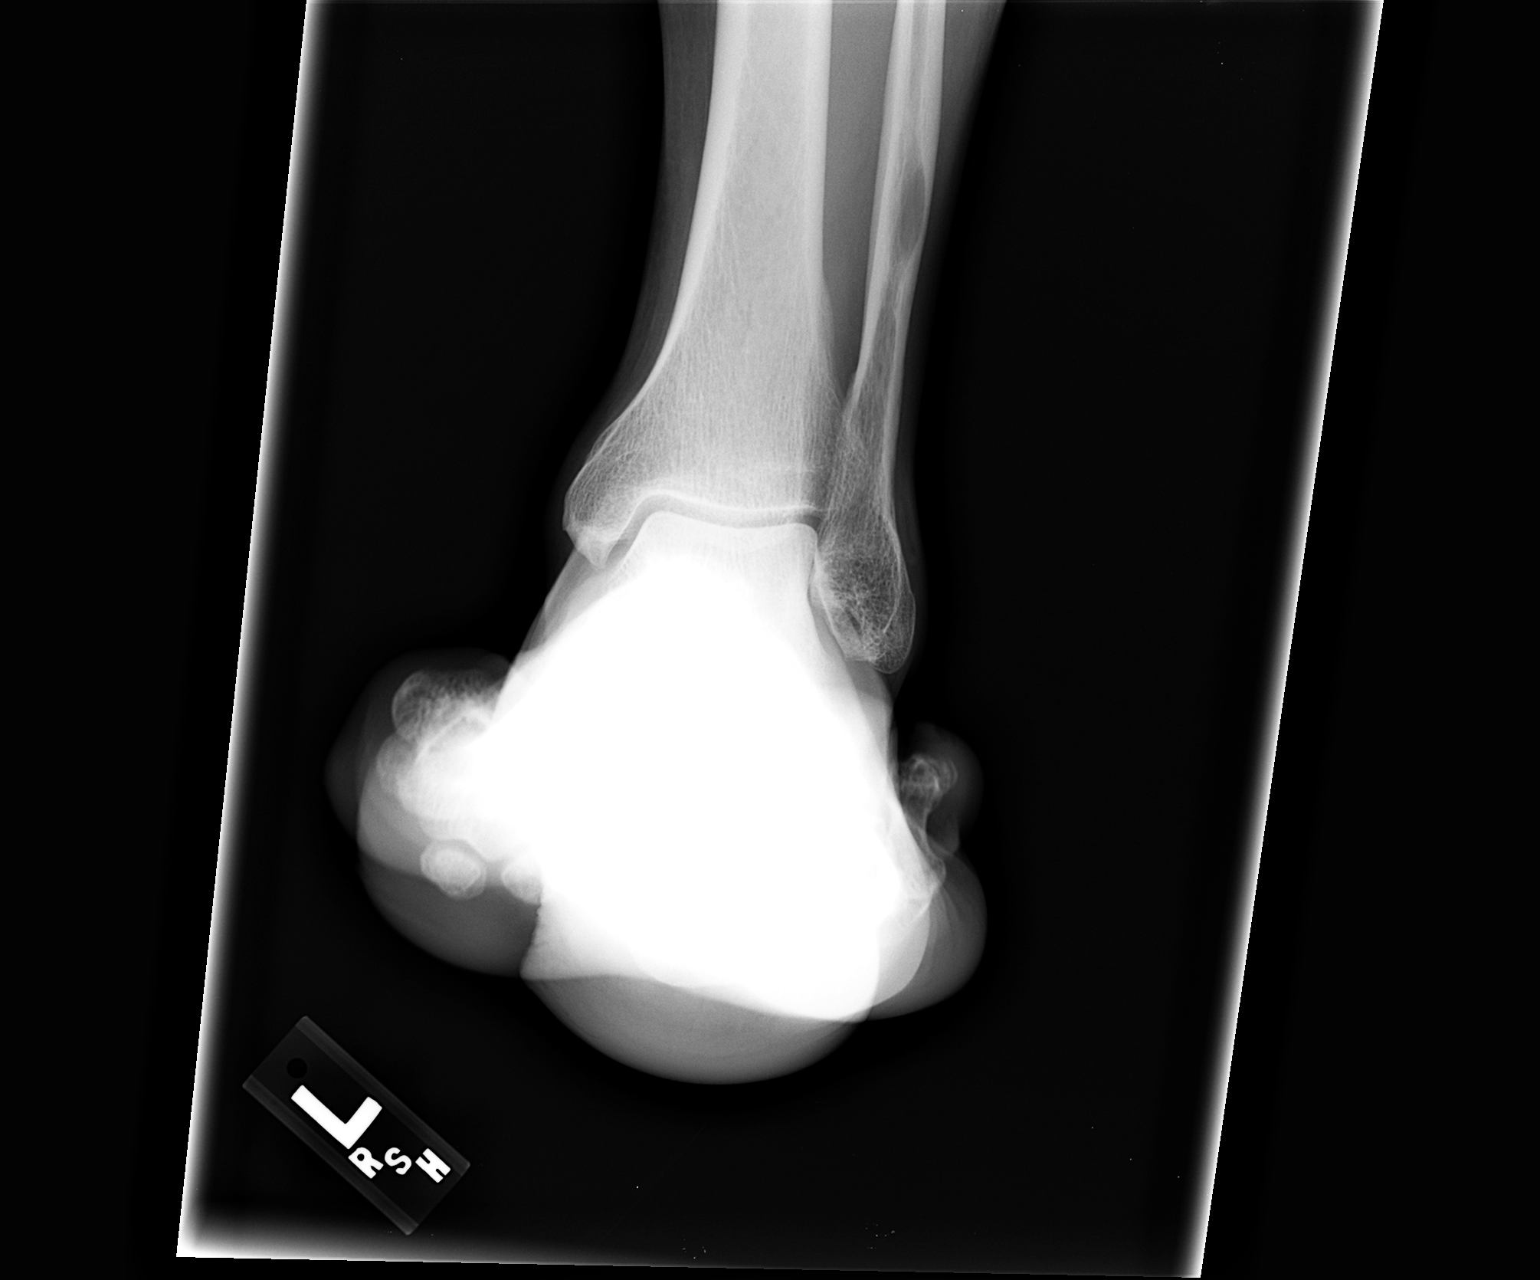

[view not recorded (2 of 3)]
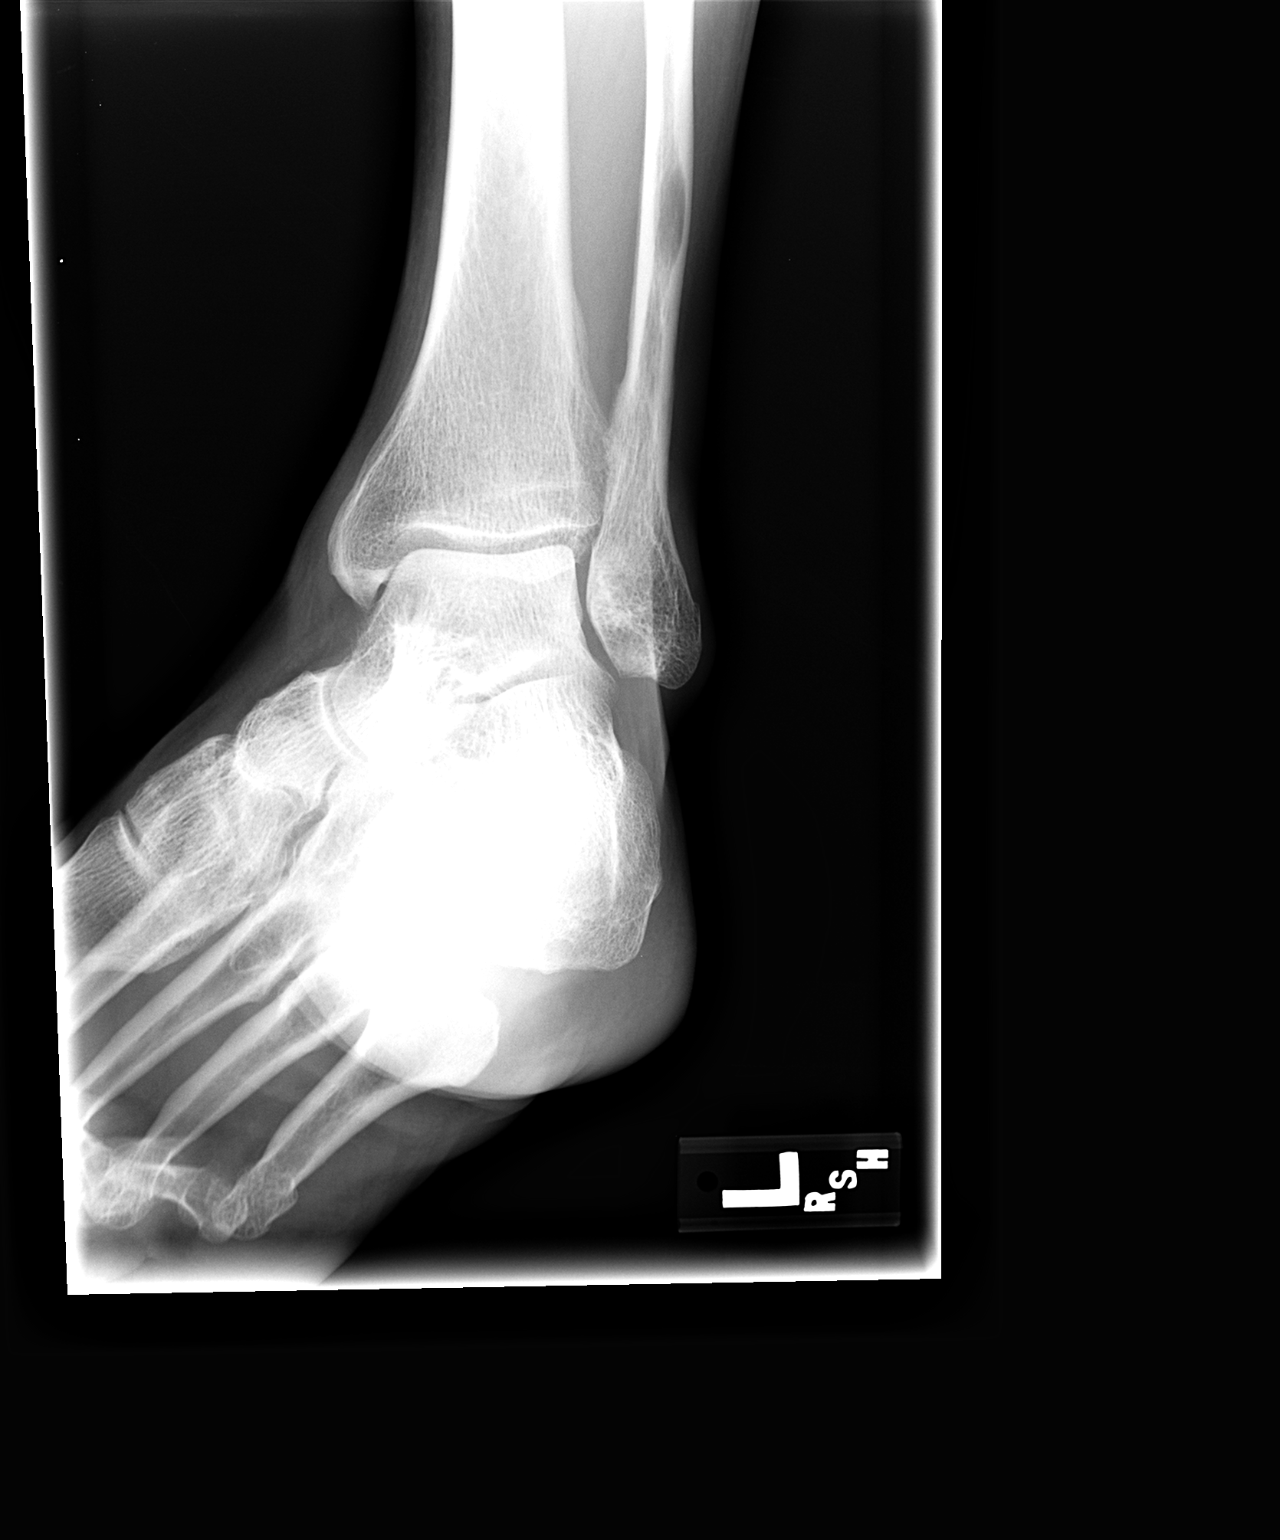

[view not recorded (3 of 3)]
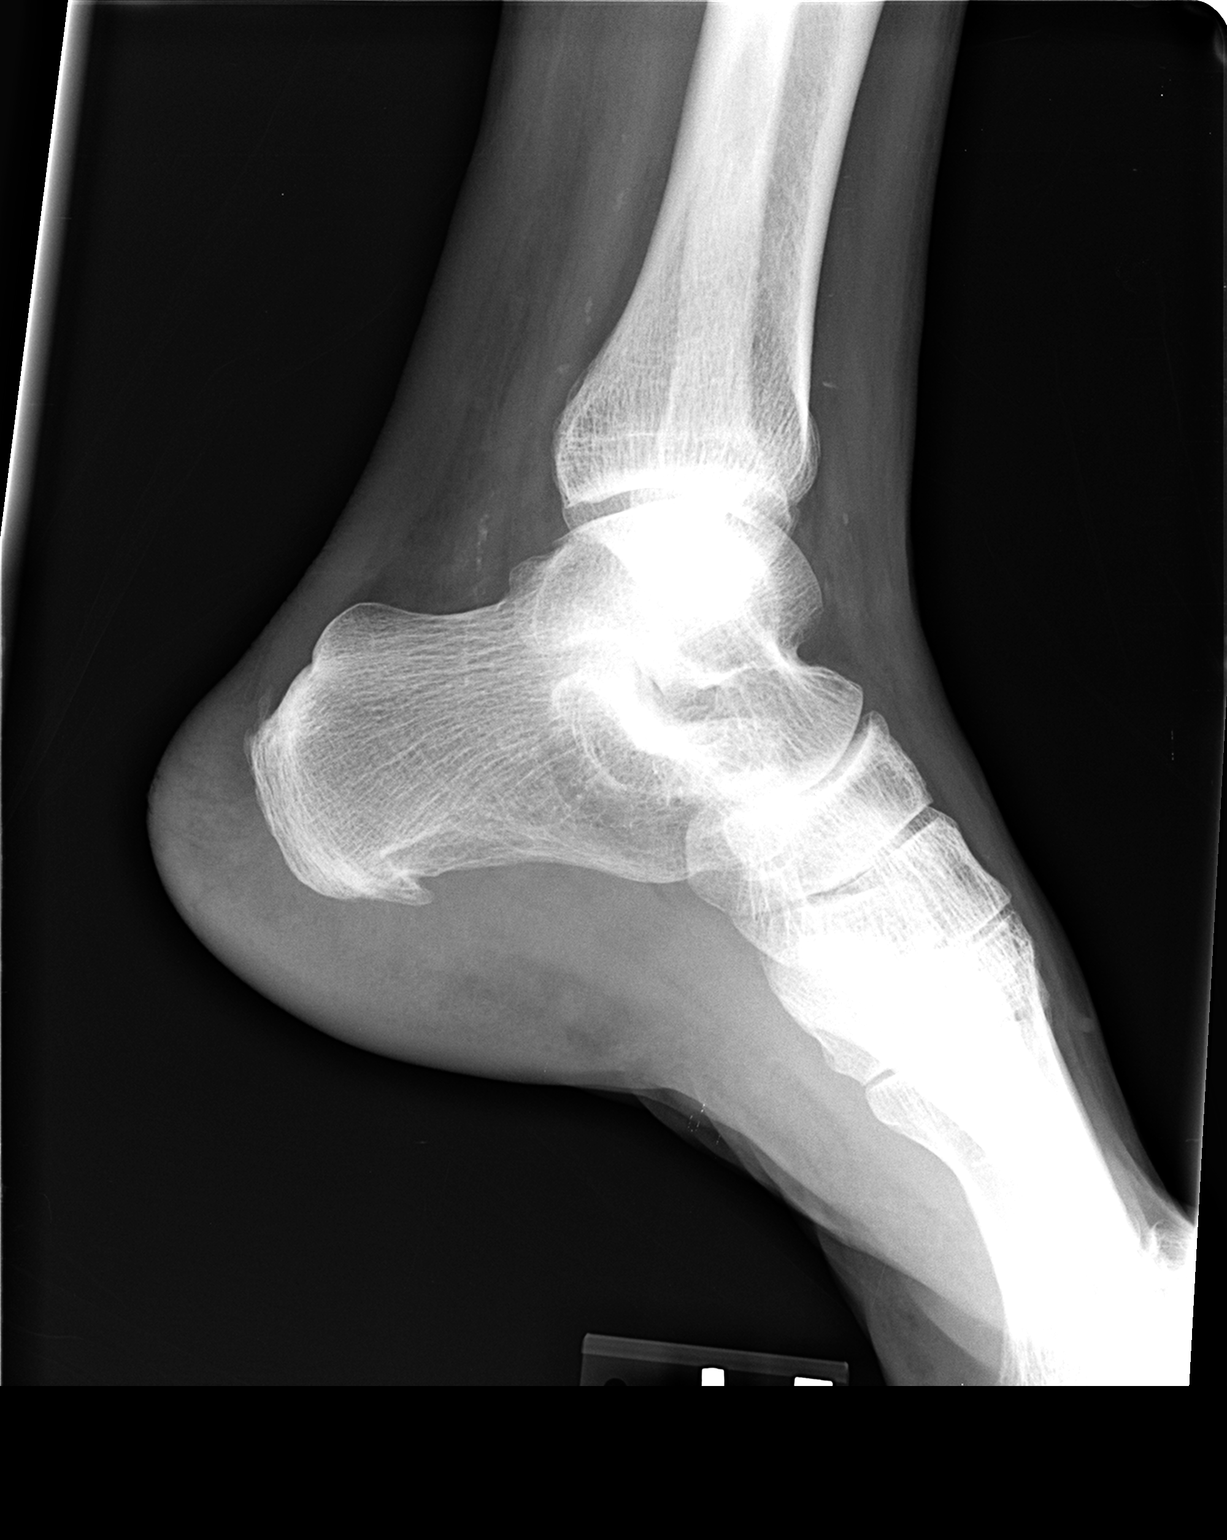

[3 of 3 positions shown; findings below may reference images not displayed]

FINDINGS: No fracture or dislocation is identified.  Plantar
calcaneal spur is noted.  There appears to be a small, well-
circumscribed cyst in the distal fibula.
IMPRESSION: No acute finding.

## 2013-01-01 IMAGING — MR MR HEAD WO/W CM
6 of 11 series · 29 of 48 positions shown · IV contrast (multihance)
Comparison: 07/06/2010.

CLINICAL DATA: New onset of seizures and confusion.  Reportedly
there was abrupt cessation of Xanax. History of hypertension and
diabetes.

MRI HEAD WITHOUT AND WITH CONTRAST
TECHNIQUE: Multiplanar, multiecho pulse sequences of the brain and
surrounding structures were obtained according to standard protocol
without and with intravenous contrast
Contrast: MultiHance, 20 ml

[Series 4: DWI · axial · 5.0mm · 1.09mm/px · z∈[-75,+83]mm · 9 of 60 slices shown (1 of 2)]
[im 1/60]
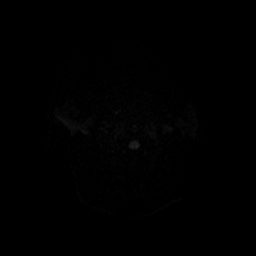
[im 8/60]
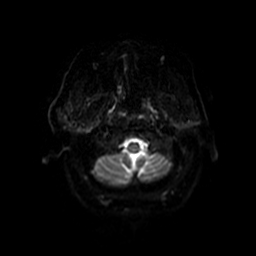
[im 15/60]
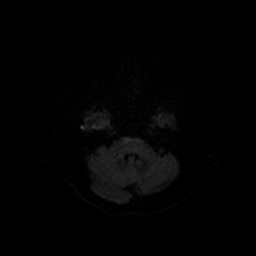
[im 23/60]
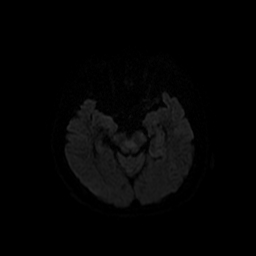
[im 30/60]
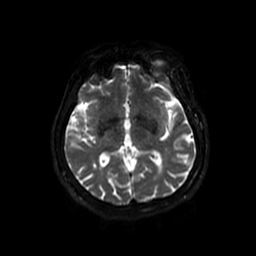
[im 37/60]
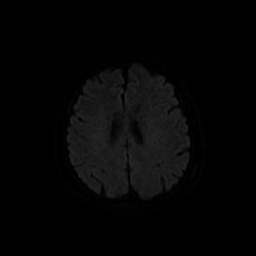
[im 45/60]
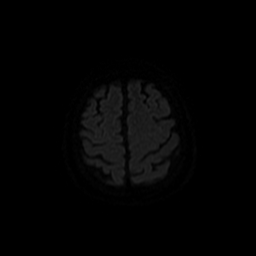
[im 52/60]
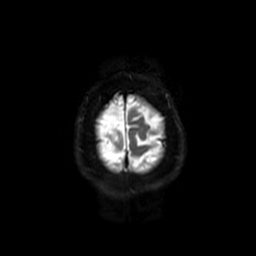
[im 60/60]
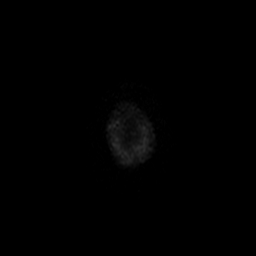

[Series 9: T2 · axial · 5.0mm · 0.43mm/px · z∈[-93,+66]mm · 4 of 24 slices shown (1 of 2)]
[im 1/24]
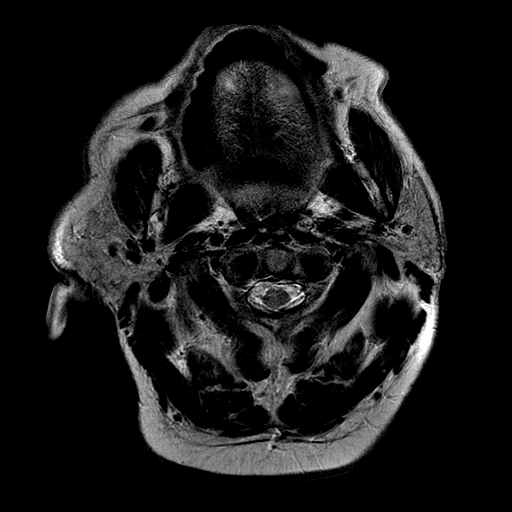
[im 8/24]
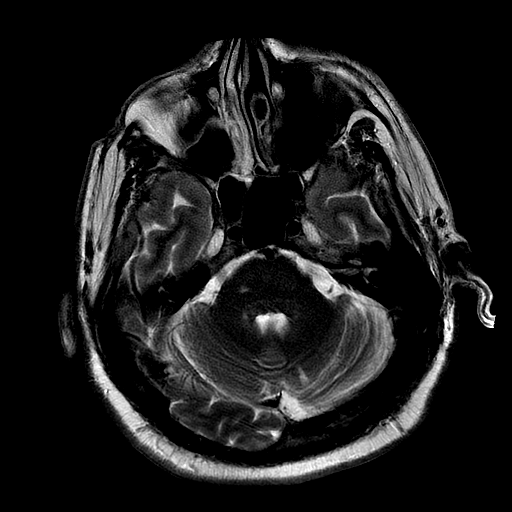
[im 16/24]
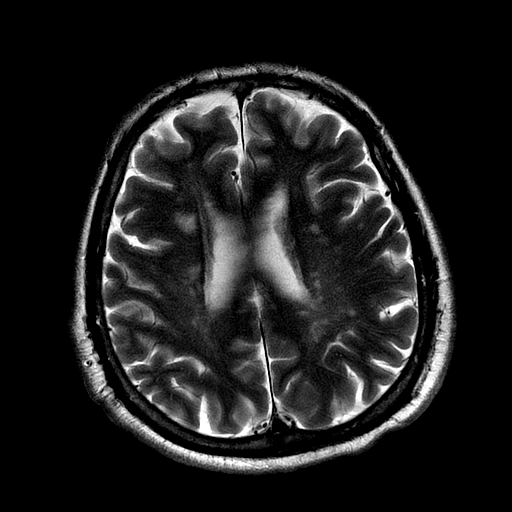
[im 24/24]
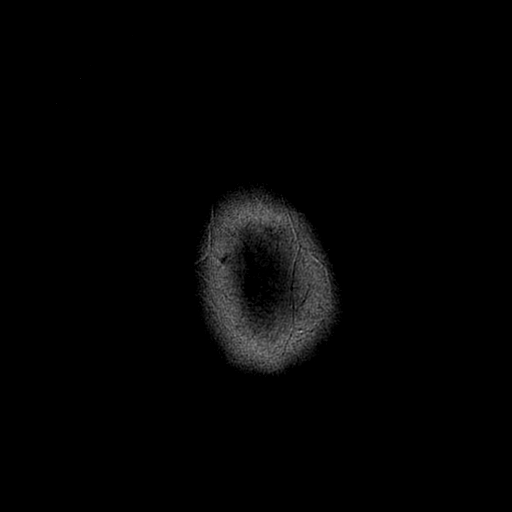

[Series 10: T2 · coronal · 3.0mm · 0.35mm/px · 3 of 30 slices shown (2 of 2)]
[im 1/30]
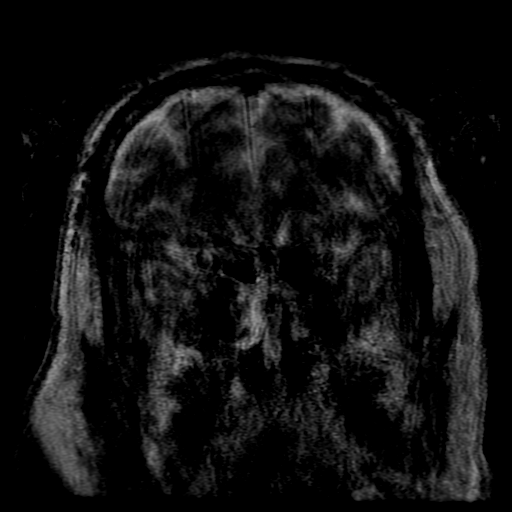
[im 8/30]
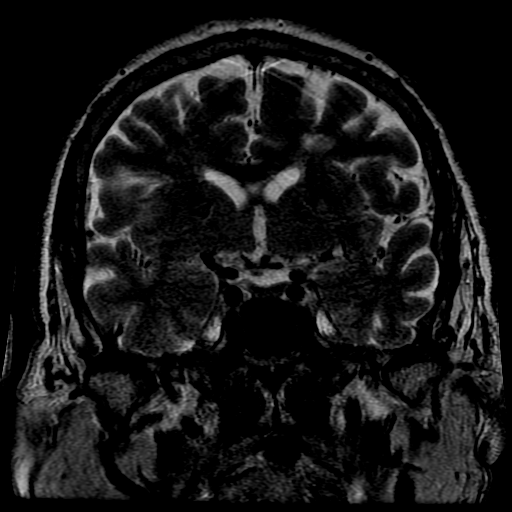
[im 15/30]
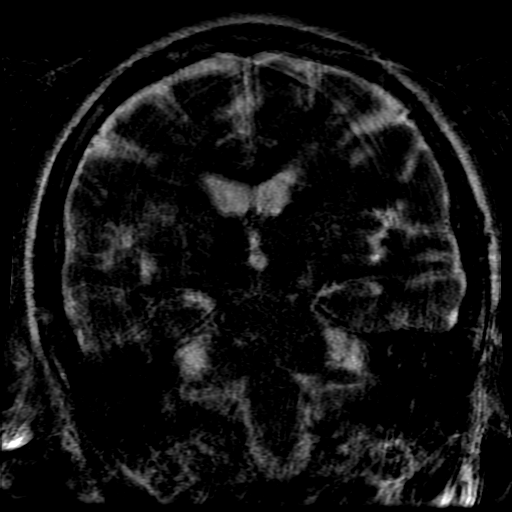

[Series 11: FLAIR · axial · 5.0mm · 0.43mm/px · z∈[-93,+66]mm · 4 of 24 slices shown]
[im 1/24]
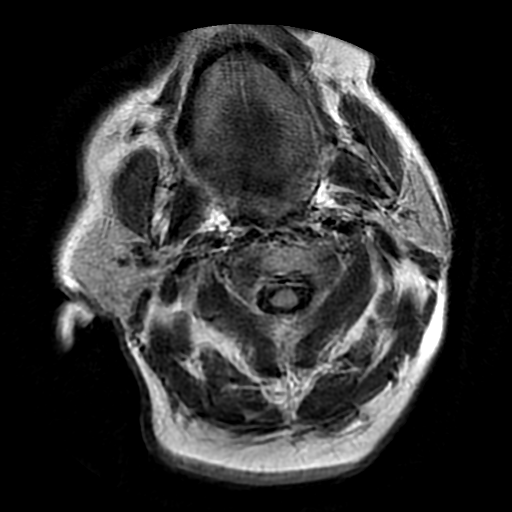
[im 8/24]
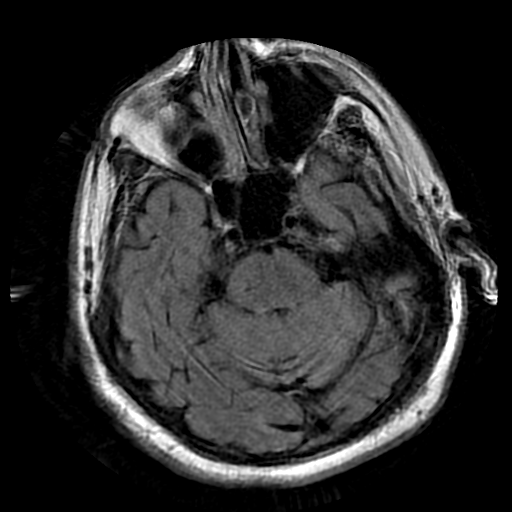
[im 16/24]
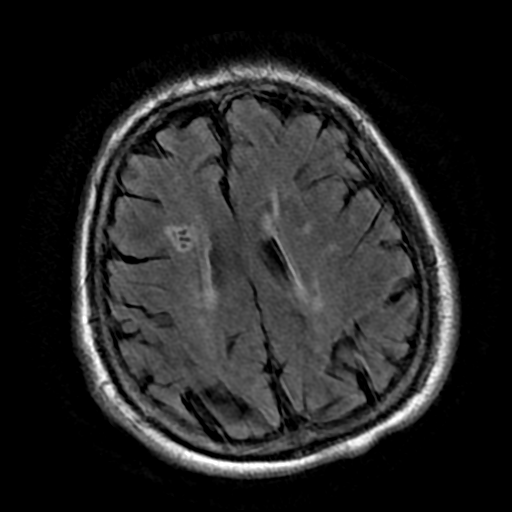
[im 24/24]
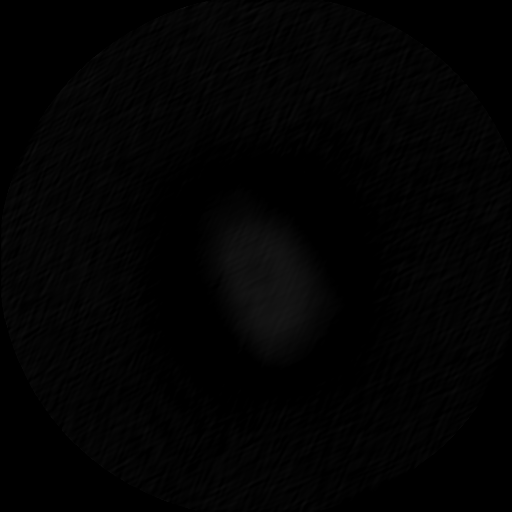

[Series 16: T1 post-contrast · axial · 5.0mm · 0.43mm/px · z∈[-93,+66]mm · 4 of 24 slices shown]
[im 1/24]
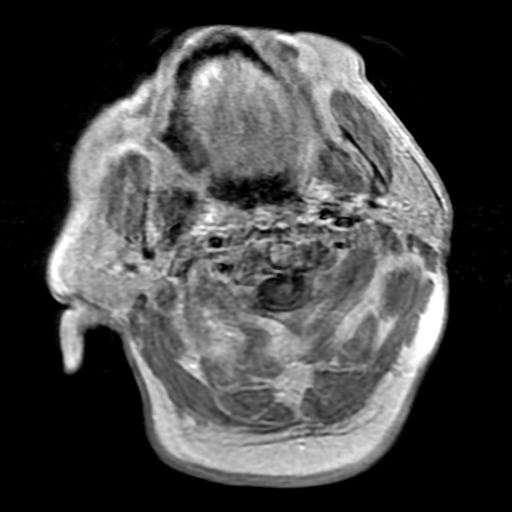
[im 8/24]
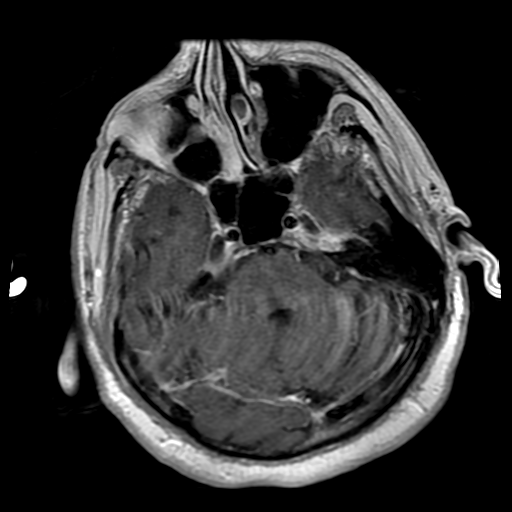
[im 16/24]
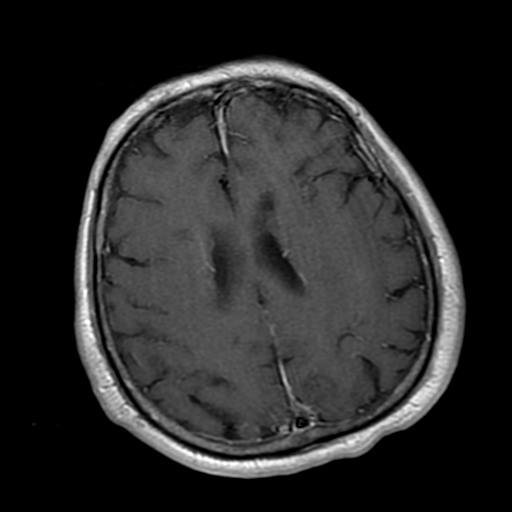
[im 24/24]
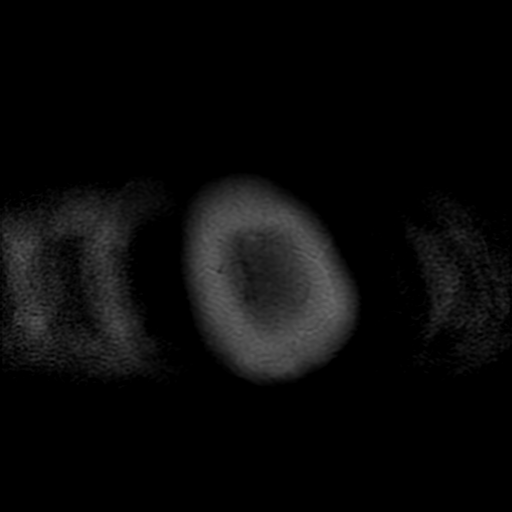

[Series 400: DWI · axial · 5.0mm · 1.09mm/px · z∈[-75,+83]mm · 5 of 29 slices shown (2 of 2)]
[im 1/29]
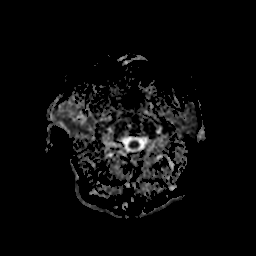
[im 8/29]
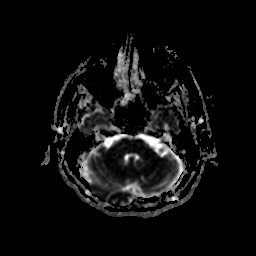
[im 15/29]
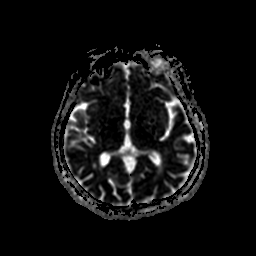
[im 22/29]
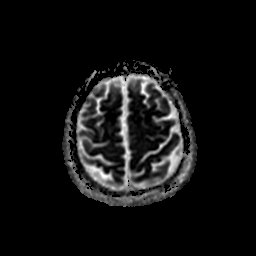
[im 29/29]
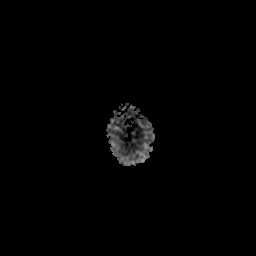

[29 of 48 positions shown; findings below may reference images not displayed]

FINDINGS: The patient was moderately uncooperative and could not
remain motionless for the study.  Images are suboptimal.  Small or
subtle lesions could be overlooked.

There is no evidence for acute infarction, intracranial hemorrhage,
mass lesion, hydrocephalus, or extra-axial fluid.  Mild atrophy is
present.  Moderate chronic microvascular ischemic change is noted.
Scattered lacunes are present. Grossly normal pituitary and
cerebellar tonsils.

Post infusion, there is no abnormal intracranial enhancement.  The
major intracranial vascular structures are patent.  Mild chronic
sinus disease is evident.  There has been previous left cataract
extraction.
IMPRESSION: Motion degraded exam; please see comments above.

Atrophy and small vessel disease without apparent acute infarction,
or acute intracranial hemorrhage.  There is no visible abnormal
post contrast enhancement.

## 2013-01-01 IMAGING — CR DG FOOT COMPLETE 3+V*L*
3 series · 3 of 3 positions shown · non-contrast
Comparison: None.

CLINICAL DATA: Left foot pain

LEFT FOOT - COMPLETE 3+ VIEW

[view not recorded (1 of 3)]
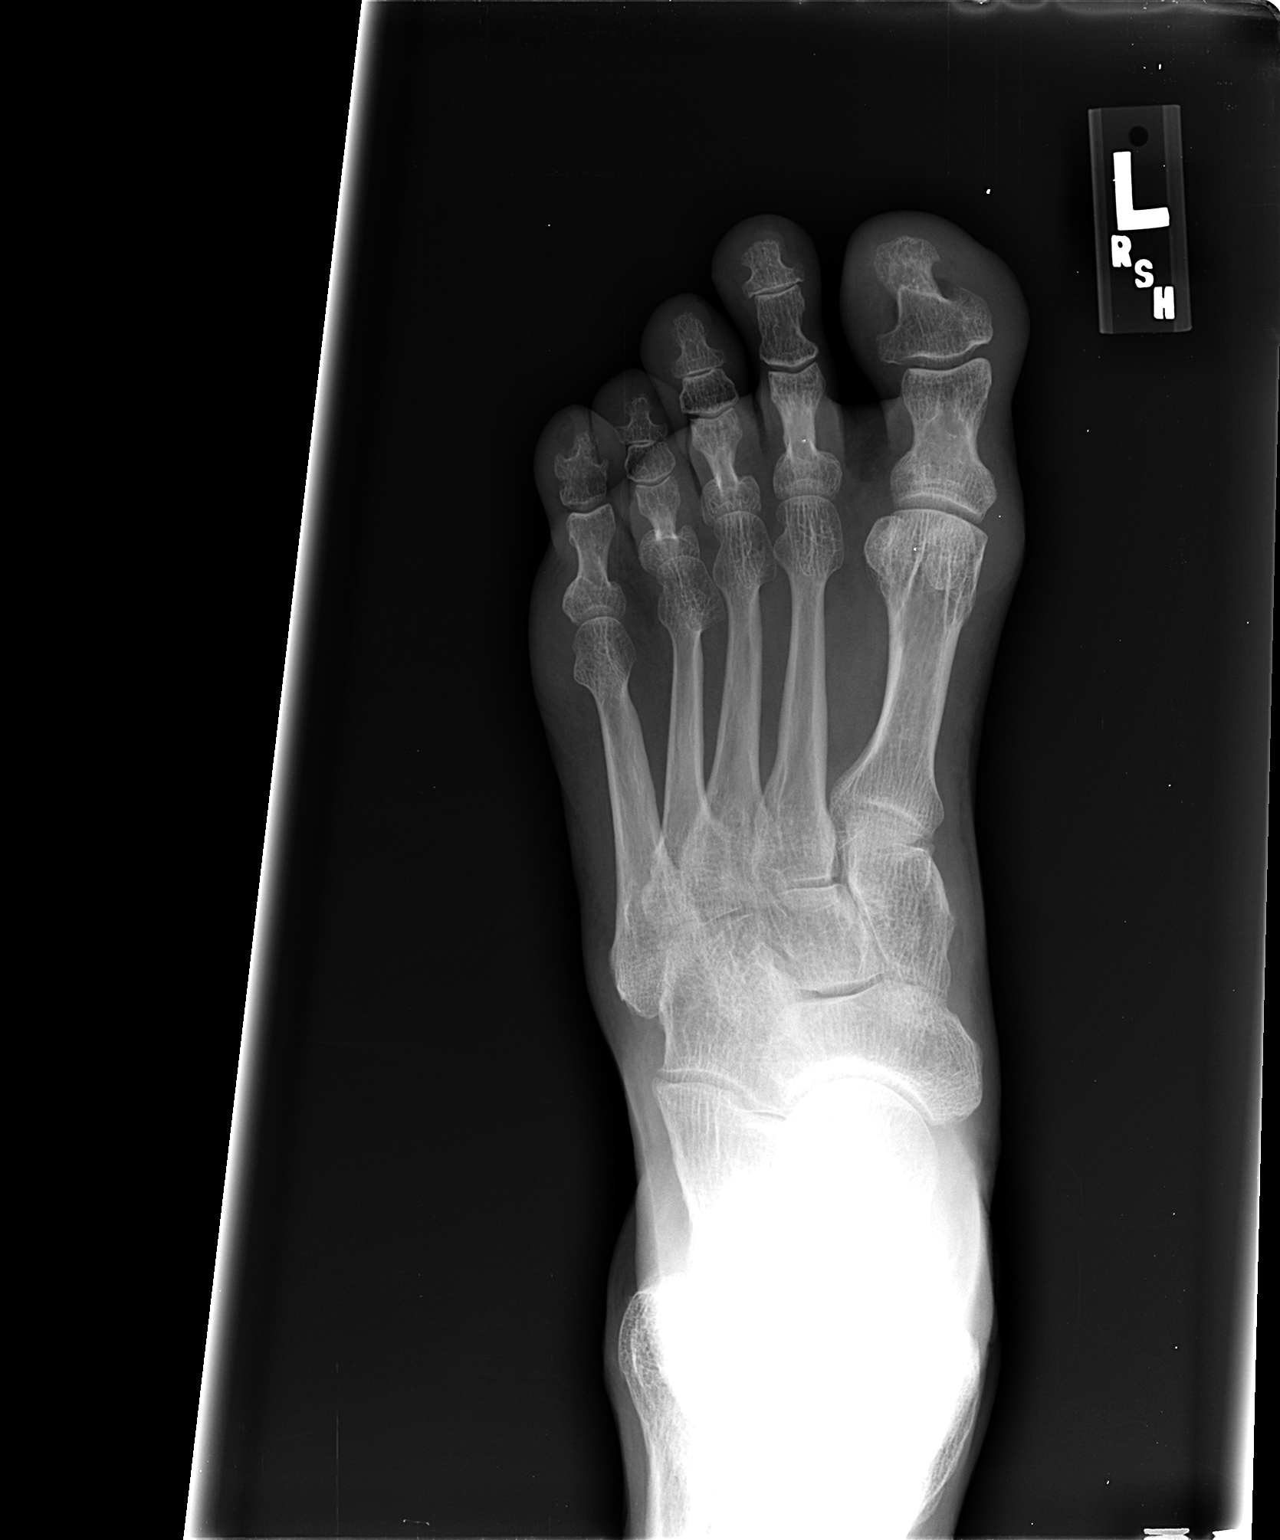

[view not recorded (2 of 3)]
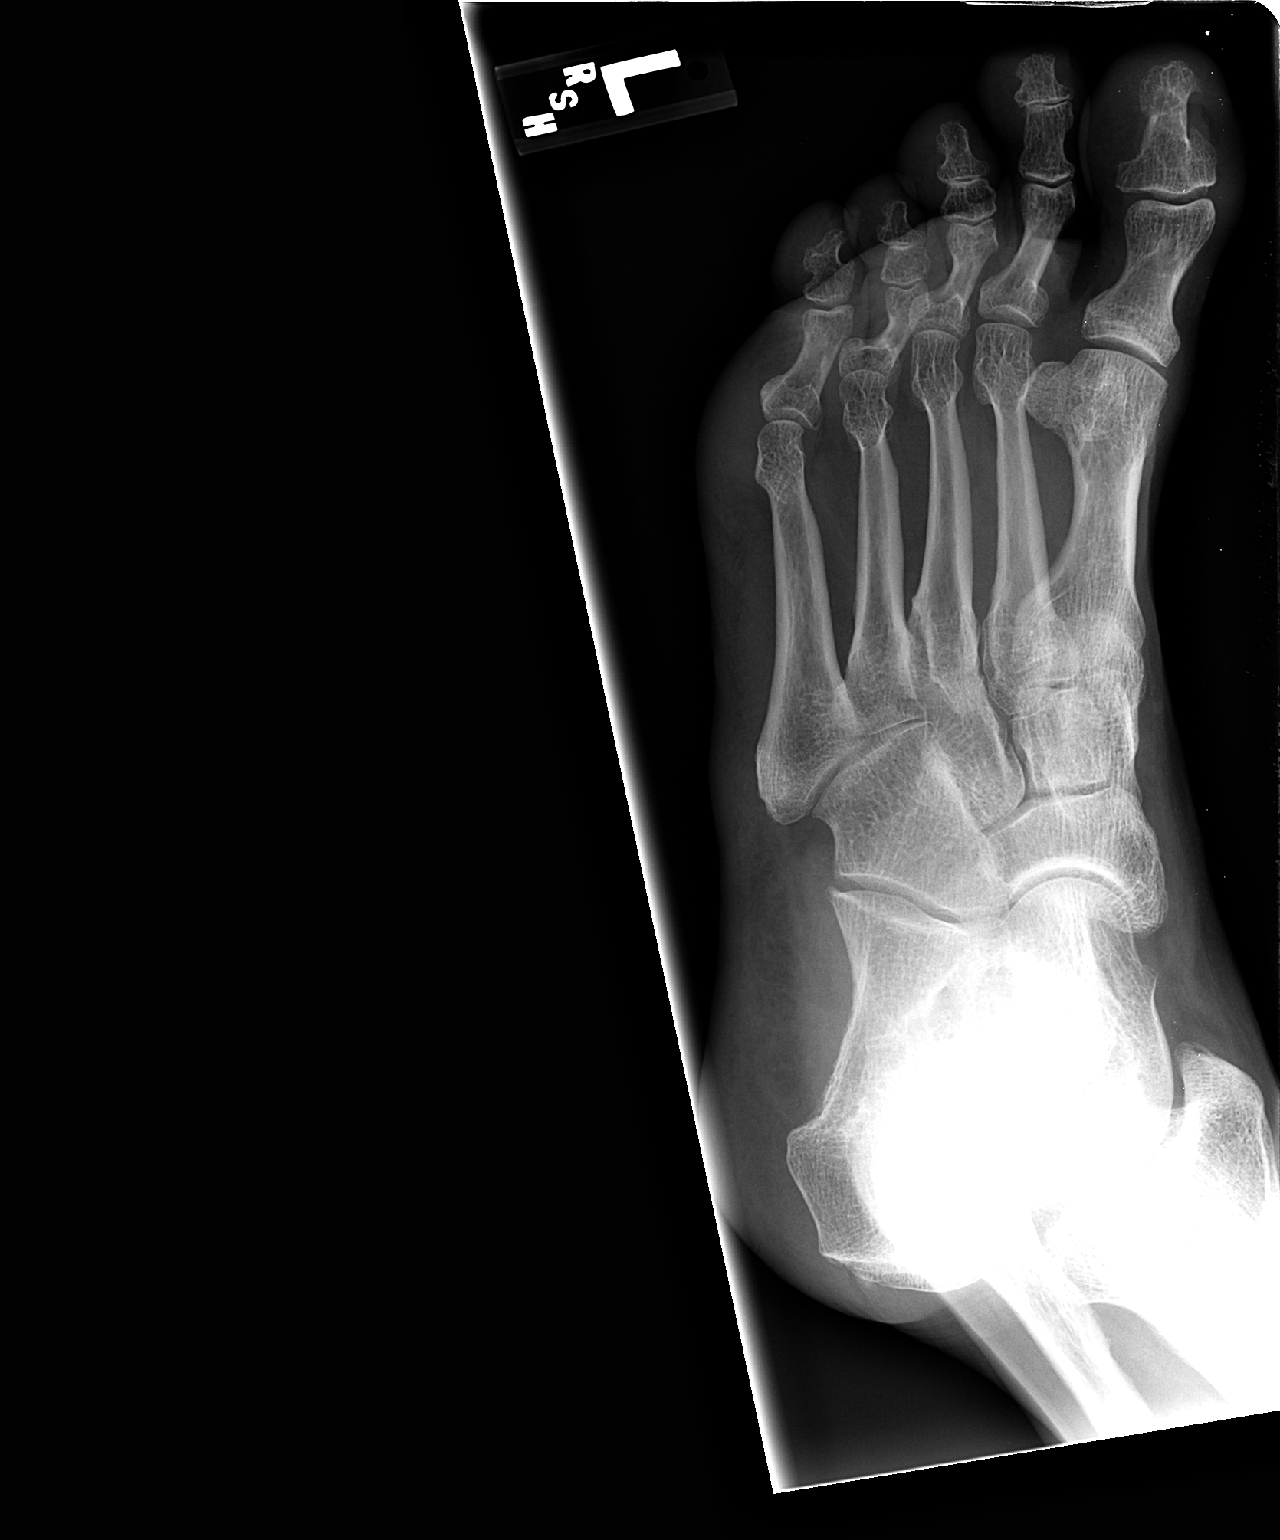

[view not recorded (3 of 3)]
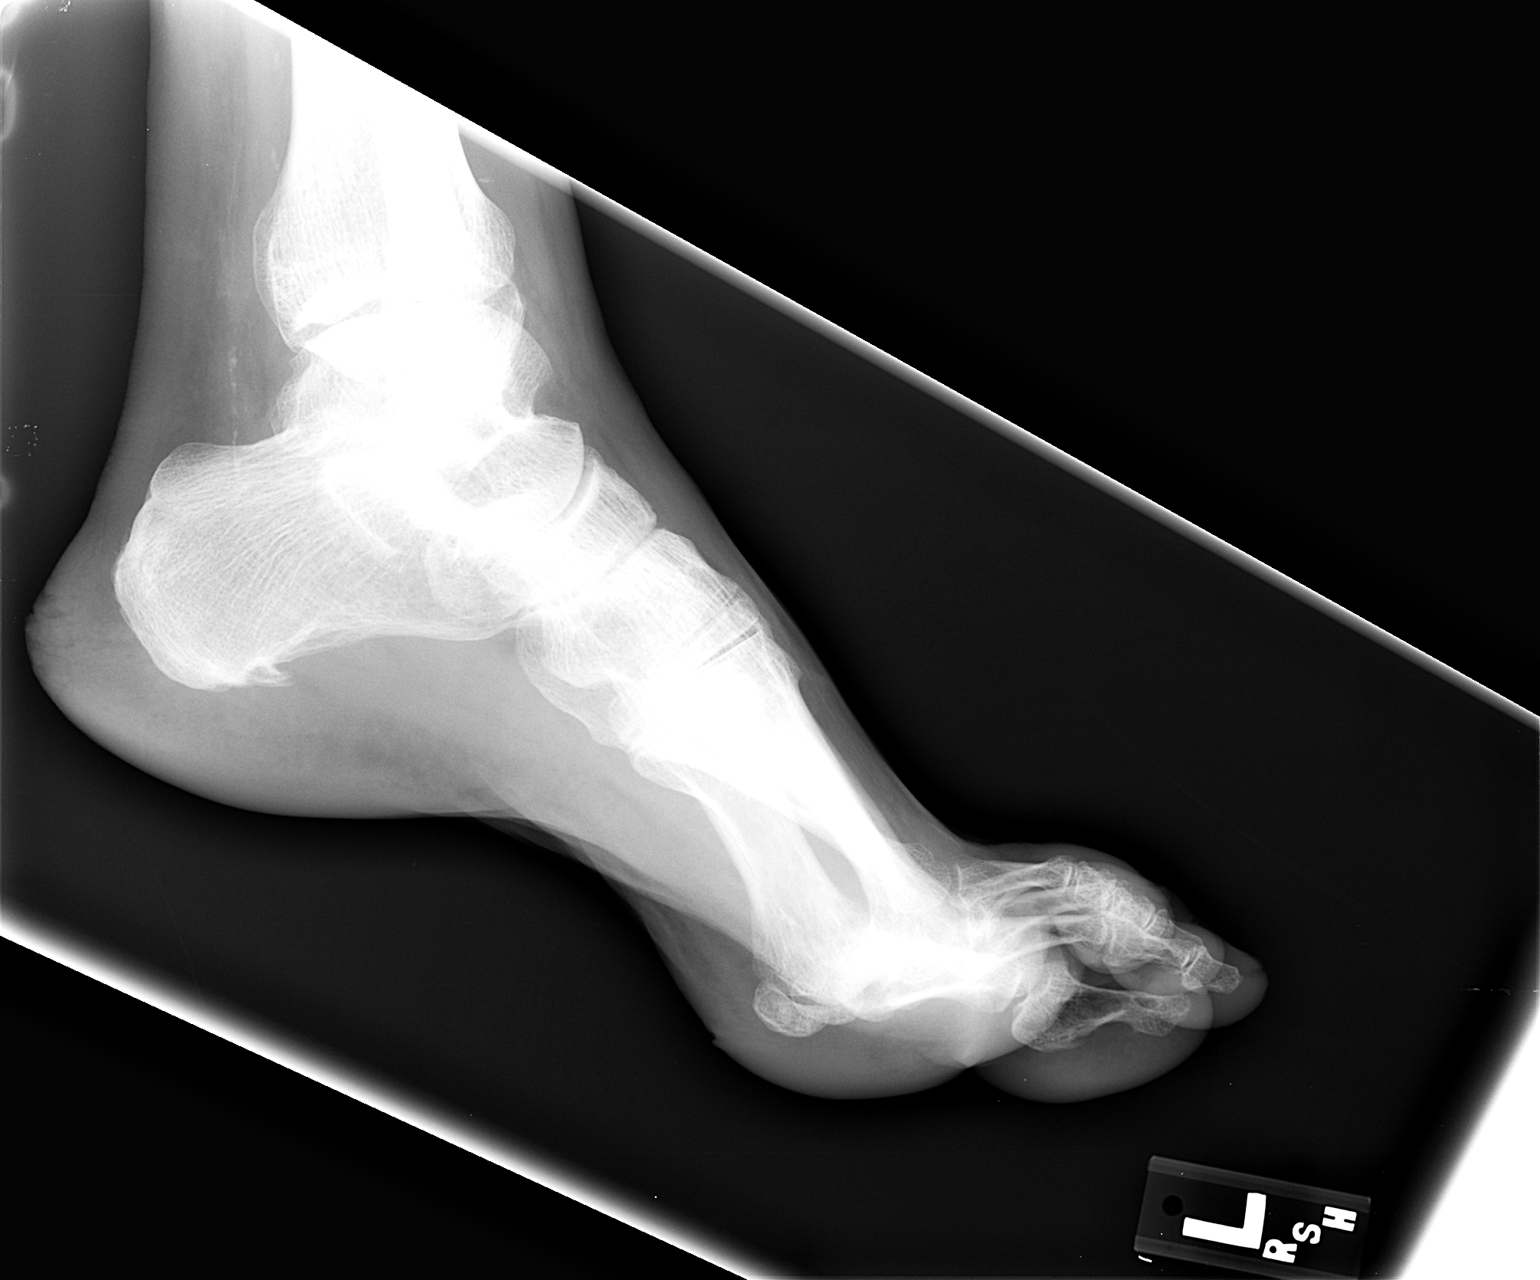

[3 of 3 positions shown; findings below may reference images not displayed]

FINDINGS: There is no evidence of fracture or dislocation.  No
periosteal reaction or cortical destruction are identified.  The
joint compartments are maintained.  There is a plantar calcaneal
spur.
IMPRESSION: Plantar calcaneal spur.

## 2013-01-05 ENCOUNTER — Other Ambulatory Visit: Payer: Self-pay | Admitting: Sports Medicine

## 2013-01-05 ENCOUNTER — Ambulatory Visit: Payer: Self-pay | Admitting: Internal Medicine

## 2013-01-08 ENCOUNTER — Other Ambulatory Visit: Payer: Self-pay | Admitting: Sports Medicine

## 2013-01-08 ENCOUNTER — Inpatient Hospital Stay: Payer: Self-pay | Admitting: Sports Medicine

## 2013-01-08 MED ORDER — ALPRAZOLAM 1 MG PO TABS: 1.0000 mg | ORAL_TABLET | Freq: Every evening | ORAL | Status: DC | PRN

## 2013-01-08 MED ORDER — LEVETIRACETAM 500 MG PO TABS: 500.0000 mg | ORAL_TABLET | Freq: Two times a day (BID) | ORAL | Status: DC

## 2013-01-10 ENCOUNTER — Encounter: Payer: Self-pay | Admitting: Sports Medicine

## 2013-01-10 ENCOUNTER — Ambulatory Visit (INDEPENDENT_AMBULATORY_CARE_PROVIDER_SITE_OTHER): Payer: Medicare HMO | Admitting: Sports Medicine

## 2013-01-10 VITALS — BP 145/79 | HR 64 | Temp 99.5°F | Ht 63.0 in | Wt 205.0 lb

## 2013-01-10 DIAGNOSIS — I10 Essential (primary) hypertension: Secondary | ICD-10-CM

## 2013-01-10 DIAGNOSIS — F29 Unspecified psychosis not due to a substance or known physiological condition: Secondary | ICD-10-CM

## 2013-01-10 DIAGNOSIS — Z7289 Other problems related to lifestyle: Secondary | ICD-10-CM

## 2013-01-10 DIAGNOSIS — Z609 Problem related to social environment, unspecified: Secondary | ICD-10-CM

## 2013-01-10 DIAGNOSIS — M3313 Other dermatomyositis without myopathy: Secondary | ICD-10-CM

## 2013-01-10 DIAGNOSIS — I4891 Unspecified atrial fibrillation: Secondary | ICD-10-CM

## 2013-01-10 DIAGNOSIS — F17200 Nicotine dependence, unspecified, uncomplicated: Secondary | ICD-10-CM

## 2013-01-10 DIAGNOSIS — I2510 Atherosclerotic heart disease of native coronary artery without angina pectoris: Secondary | ICD-10-CM

## 2013-01-10 DIAGNOSIS — M3390 Dermatopolymyositis, unspecified, organ involvement unspecified: Secondary | ICD-10-CM

## 2013-01-10 DIAGNOSIS — Z79899 Other long term (current) drug therapy: Secondary | ICD-10-CM

## 2013-01-10 DIAGNOSIS — K030 Excessive attrition of teeth: Secondary | ICD-10-CM

## 2013-01-10 MED ORDER — RISPERIDONE 1 MG PO TABS: 1.0000 mg | ORAL_TABLET | Freq: Every day | ORAL | Status: DC

## 2013-01-10 MED ORDER — DIVALPROEX SODIUM ER 500 MG PO TB24: 500.0000 mg | ORAL_TABLET | Freq: Two times a day (BID) | ORAL | Status: DC

## 2013-01-10 MED ORDER — ATORVASTATIN CALCIUM 40 MG PO TABS: 40.0000 mg | ORAL_TABLET | Freq: Every day | ORAL | Status: DC

## 2013-01-10 MED ORDER — LEVETIRACETAM 500 MG PO TABS: 500.0000 mg | ORAL_TABLET | Freq: Two times a day (BID) | ORAL | Status: DC

## 2013-01-10 MED ORDER — LISINOPRIL 10 MG PO TABS: 10.0000 mg | ORAL_TABLET | Freq: Every day | ORAL | Status: DC

## 2013-01-10 MED ORDER — OXYBUTYNIN CHLORIDE 5 MG PO TABS: 5.0000 mg | ORAL_TABLET | Freq: Every day | ORAL | Status: DC

## 2013-01-10 MED ORDER — ESOMEPRAZOLE MAGNESIUM 40 MG PO CPDR: 40.0000 mg | DELAYED_RELEASE_CAPSULE | Freq: Every day | ORAL | Status: DC

## 2013-01-10 MED ORDER — TORSEMIDE 20 MG PO TABS: 20.0000 mg | ORAL_TABLET | Freq: Every day | ORAL | Status: DC

## 2013-01-10 MED ORDER — AMIODARONE HCL 200 MG PO TABS: 200.0000 mg | ORAL_TABLET | Freq: Every day | ORAL | Status: DC

## 2013-01-10 MED ORDER — ROPINIROLE HCL 2 MG PO TABS: 2.0000 mg | ORAL_TABLET | Freq: Three times a day (TID) | ORAL | Status: DC

## 2013-01-10 NOTE — Progress Notes (Signed)
Patrick Holt - 62 y.o. male MRN [id number]  Date of birth: 06/07/1950  CC, HPI, Interval History & ROS  Patrick Holt is here today to followup from a recent hospitalization do to chest pain and for management of chronic diseases.    He reports overall feeling significantly better.  He has stopped his methotrexate on his own.  He denies any falls.  Pt denies chest pain, dyspnea at rest or exertion, PND, lower extremity edema.  Patient denies any facial asymmetry, unilateral weakness, or dysarthria.    He currently is receiving all medications free of charge as he has reached his deductible and is interested in 90 day supplies at this time.  He has cut back to 6-7 cigarettes per day.  He does not have his medications with him today.  Pertinent History & Care Coordination  No Patient Care Coordination Note on file.  History  Smoking status  . Current Every Day Smoker -- 0.50 packs/day for 30 years  . Types: Cigarettes  Smokeless tobacco  . Never Used   Health Maintenance Due  Topic  . Zostavax     Recent Labs  02/03/12 1819  05/26/12 1433 06/26/12 1418 10/18/12 0400 11/09/12 1035 11/20/12 1518 12/29/12 0425  HGBA1C 5.5  --  4.8  --   --   --  5.3  --   TRIG  --   --   --   --   --   --   --  91  CHOL  --   --   --   --   --   --   --  157  HDL  --   --   --   --   --   --   --  51  LDLCALC  --   --   --   --   --   --   --  88  TSH  --   < >  --  0.41 0.752 0.708  --   --   < > = values in this interval not displayed.   Otherwise past Medical, Surgical, Social, and Family History Reviewed per EMR Medications and Allergies reviewed and all updated if necessary. Objective Findings  VITALS: HR: 64 bpm  BP: 145/79 mmHg  TEMP: 99.5 F (37.5 C) (Oral)  RESP:    HT: 5\' 3"  (160 cm)  WT: 205 lb (92.987 kg)  BMI: 36.4   BP Readings from Last 3 Encounters:  01/10/13 145/79  12/30/12 127/75  12/29/12 139/76   Wt Readings from Last 3 Encounters:  01/10/13 205 lb  (92.987 kg)  12/29/12 207 lb 10.8 oz (94.2 kg)  11/20/12 193 lb (87.544 kg)     PHYSICAL EXAM: GENERAL: adult Caucasian  male. In no discomfort; no respiratory distress  PSYCH: alert and appropriate,  moderate insight,  Appropriate in conversation  HNEENT:   CARDIO: RRR, S1/S2 heard, 2/6 systolic ejection murmur murmur  LUNGS: CTA B, no wheezes, no crackles  ABDOMEN:   EXTREM:  Warm, well perfused.  Moves all 4 extremities spontaneously; no lateralization.  No noted foot lesions.  Distal pulses 1+/4.  No pretibial edema.  GU:   SKIN:      Assessment & Plan   Problems addressed today: General Plan & Pt Instructions:  1. TOBACCO USER   2. HYPERTENSION   3. Dermatomyositis   4. Atrial fibrillation   5. Coronary atherosclerosis of native coronary artery   6. Psychotic disorder   7. High  risk social situation   8. Dental attrition, excessive   9. Polypharmacy       Stop Lovastatin, I will restart Lipitor  Keep working on quitting smoking.   I am sending in new prescriptions for 90 days.  I will look into a dentistry referral.    You do need an eye exam by an ophthalmologist.     For further discussion of A/P and for follow up issues see problem based charting.

## 2013-01-10 NOTE — Assessment & Plan Note (Signed)
Medication compliance seems to have improved. Will reorder 90 day supplies of all medications at this time as this will hopefully get him 3 months worth without charge.

## 2013-01-10 NOTE — Assessment & Plan Note (Signed)
Currently off of medications.  We'll continue monitoring.  Unfortunately referral for rheumatology in town is not obtainable due to financial and previous dismissals from rheumatology

## 2013-01-10 NOTE — Assessment & Plan Note (Signed)
Patient needs to be seen by dentistry. He has excessive dental attrition which is likely complicating his overall care.

## 2013-01-10 NOTE — Assessment & Plan Note (Signed)
No recurrent tachycardia palpations. He is not a candidate for anticoagulation due to subdural bleed in January 2014.

## 2013-01-10 NOTE — Assessment & Plan Note (Signed)
And had been stable, patient does not have his medications at this time and given and his lack of insight and hesitant to make significant changes to his regimen especially the setting of multiple recurrent falls.  He has been on meloxicam recently and this may be contributing.  This has been discontinued.  He is following up with cardiology later this week, if he is persistently hypertensive would recommend increasing his lisinopril.

## 2013-01-10 NOTE — Patient Instructions (Signed)
   Stop Lovastatin, I will restart Lipitor  Keep working on quitting smoking.   I am sending in new prescriptions for 90 days.  I will look into a dentistry referral.    You do need an eye exam by an ophthalmologist.   If you need anything prior to your next visit please call the clinic. Please Bring all medications or accurate medication list with you to each appointment; an accurate medication list is essential in providing you the best care possible.

## 2013-01-10 NOTE — Assessment & Plan Note (Signed)
He is reportedly trying to quit smoking which is a significant improvement.  Please see overview for history.Marland Kitchen

## 2013-01-10 NOTE — Assessment & Plan Note (Signed)
He is in a regular rate and rhythm today. Will see Cards later this week

## 2013-01-10 NOTE — Assessment & Plan Note (Signed)
The patient is living alone again. Not interested in making a change and he has the capacity to make the appropriate decision at this time.

## 2013-01-10 NOTE — Assessment & Plan Note (Signed)
Markedly improved and pleasantly interactive No changes to medications at this time.

## 2013-01-12 ENCOUNTER — Ambulatory Visit: Payer: Medicare Other | Admitting: Internal Medicine

## 2013-01-17 ENCOUNTER — Telehealth: Payer: Self-pay

## 2013-01-17 NOTE — Telephone Encounter (Signed)
Patient states that at his last visit with [name] that he was to change his meds (does not know the name) and this was never done. Also patient needs 90 day supply of his meds. Please call patient.

## 2013-01-18 NOTE — Telephone Encounter (Signed)
Patient was finformed

## 2013-01-18 NOTE — Telephone Encounter (Signed)
All medications were sent to pharmacy during his last visit for 90 day supplies.  His simvastatin was also changed to Lipitor and this prescription was also sent in.  I'm not sure what the problem is that the patient should likely call his pharmacy.

## 2013-01-22 ENCOUNTER — Encounter: Payer: Self-pay | Admitting: Internal Medicine

## 2013-01-22 ENCOUNTER — Encounter: Payer: Self-pay | Admitting: *Deleted

## 2013-01-22 ENCOUNTER — Ambulatory Visit (INDEPENDENT_AMBULATORY_CARE_PROVIDER_SITE_OTHER): Payer: Medicare HMO | Admitting: Internal Medicine

## 2013-01-22 VITALS — BP 144/78 | HR 52 | Ht 63.0 in | Wt 203.0 lb

## 2013-01-22 DIAGNOSIS — R079 Chest pain, unspecified: Secondary | ICD-10-CM

## 2013-01-22 NOTE — Patient Instructions (Signed)
Your physician has requested that you have a lexiscan myoview. For further information please visit [email or web address]. Please follow instruction sheet, as given.   

## 2013-01-22 NOTE — Progress Notes (Signed)
HPI Patient is a 62 yo with a histroy of reported CAD (no records), dermatomyositis, HTN, DM, Afib, COPD, HTN, subduralor subarachnoid hematoma, RA and schizoaffective disorder. The patient was hsop in 12/13 for fall (? Due to seizure)  In rapid afib.   . Echo 9/14: Mild LVH, vigorous LV, EF 65-70%, grade 1 diastolic dysfunction, mild LAE, mild aortic stenosis, mean gradient 10.  I saw the patient in clinic in May   Patient was admitted to [facility] in November with CP  Was in Rapid AFib at time  [facility] to SR  Seen by cardiology  CP and EKG chnages felt to be demand from Afib  Plan to set up for outpatient stress test   He was in ER x 2 after for CP SInce then he has had only one other spell  Breathing is OK   Still smoking but down to 3 cigs/day    Allergies  Allergen Reactions  . Betamethasone Dipropionate     Unknown  . Bupropion Hcl     Unknown  . Ciprofloxacin     REACTION: swelling  . Clobetasol     Unknown  . Codeine     Unknown  . Escitalopram Oxalate     Unknown  . Flagyl [Metronidazole]     REACTION: swelling  . Fluoxetine Hcl     Unknown  . Fluticasone-Salmeterol     Unknown  . Furosemide     Unknown  . Immune Globulins     Acute renal failure  . Paroxetine     Unknown  . Penicillins     Unknown  . Sulfa Antibiotics Other (See Comments)    blisters  . Tacrolimus     Unknown  . Tetanus Toxoid     Unknown  . Tuberculin Purified Protein Derivative     Unknown    Current Outpatient Prescriptions  Medication Sig Dispense Refill  . ALPRAZolam (XANAX) 1 MG tablet Take 1 tablet (1 mg total) by mouth at bedtime as needed for anxiety.  90 tablet  1  . amiodarone (PACERONE) 200 MG tablet Take 1 tablet (200 mg total) by mouth daily before breakfast.  90 tablet  1  . aspirin EC 81 MG EC tablet Take 1 tablet (81 mg total) by mouth daily.      . divalproex (DEPAKOTE ER) 500 MG 24 hr tablet Take 1 tablet (500 mg total) by mouth 2 (two) times daily.  180  tablet  1  . esomeprazole (NEXIUM) 40 MG capsule Take 1 capsule (40 mg total) by mouth daily before breakfast.  90 capsule  1  . Ipratropium-Albuterol (COMBIVENT) 20-100 MCG/ACT AERS respimat Inhale 1 puff into the lungs every 6 (six) hours.      . levETIRAcetam (KEPPRA) 500 MG tablet Take 1 tablet (500 mg total) by mouth 2 (two) times daily.  180 tablet  1  . lisinopril (PRINIVIL,ZESTRIL) 10 MG tablet Take 1 tablet (10 mg total) by mouth daily.  90 tablet  1  . lovastatin (MEVACOR) 40 MG tablet       . Multiple Vitamin (MULTIVITAMIN WITH MINERALS) TABS tablet Take 1 tablet by mouth daily.  90 tablet  3  . nitroGLYCERIN (NITROSTAT) 0.4 MG SL tablet Place 0.4 mg under the tongue every 5 (five) minutes as needed. For chest pain      . oxybutynin (DITROPAN) 5 MG tablet Take 1 tablet (5 mg total) by mouth daily.  90 tablet  1  . risperiDONE (RISPERDAL) 1  MG tablet Take 1 tablet (1 mg total) by mouth at bedtime.  90 tablet  1  . rOPINIRole (REQUIP) 2 MG tablet Take 1 tablet (2 mg total) by mouth 3 (three) times daily.  270 tablet  1  . torsemide (DEMADEX) 20 MG tablet Take 1 tablet (20 mg total) by mouth daily.  90 tablet  1  . atorvastatin (LIPITOR) 40 MG tablet Take 1 tablet (40 mg total) by mouth daily.  90 tablet  1  . [DISCONTINUED] oxybutynin (DITROPAN) 5 MG tablet Take 1 tablet (5 mg total) by mouth 3 (three) times daily.  90 tablet  3   No current facility-administered medications for this visit.    Past Medical History  Diagnosis Date  . Rheumatoid arthritis(714.0)   . Obesity   . Major depression     with acute psychotic break in 06/2010  . Hypertension   . Hyperlipidemia   . Diverticulosis of colon   . COPD (chronic obstructive pulmonary disease)   . Anxiety   . GERD (gastroesophageal reflux disease)   . Vertigo   . Fibromyalgia   . Dermatomyositis   . Myocardial infarct     mulitple (1999, 2000, 2004)  . Raynaud's disease   . Narcotic dependence   . Peripheral neuropathy    . Internal hemorrhoids   . Ischemic heart disease   . Hiatal hernia   . Gastritis   . Diverticulitis   . Hx of adenomatous colonic polyps   . Nephrolithiasis   . Anemia   . Esophageal stricture   . Esophageal dysmotility   . Dermatomyositis   . Urge incontinence   . Otosclerosis   . Bipolar 1 disorder   . OCD (obsessive compulsive disorder)   . Sarcoidosis   . Paroxysmal a-fib   . Dysrhythmia     "irregular" (11/15/2012)  . Type II diabetes mellitus   . Seizures   . Headache(784.0)     "severe; get them often" (11/15/2012)    Past Surgical History  Procedure Laterality Date  . Knee arthroscopy w/ meniscal repair Left 2009  . Lumbar disc surgery    . Squamous papilloma   2010    removed by Dr. Pollyann Kennedy ENT, noted on tongue  . Esophagogastroduodenoscopy N/A 09/27/2012    Procedure: ESOPHAGOGASTRODUODENOSCOPY (EGD);  Surgeon: Hart Carwin, MD;  Location: [facility];  Service: Endoscopy;  Laterality: N/A;  . Colonoscopy N/A 09/27/2012    Procedure: COLONOSCOPY;  Surgeon: Hart Carwin, MD;  Location: [facility];  Service: Endoscopy;  Laterality: N/A;  . Cataract extraction w/ intraocular lens implant Left   . Lymph node dissection Right 1970's    "neck; dr thought I had Hodgkins; turned out to be sarcoidosis" (11/15/2012)  . Tonsillectomy    . Carpal tunnel release Bilateral   . Cardiac catheterization      Family History  Problem Relation Age of Onset  . Alcohol abuse Mother   . Depression Mother   . Heart disease Mother   . Diabetes Mother   . Stroke Mother   . Diabetes Other     1/2 brother  . Hepatitis Brother     History   Social History  . Marital Status: Divorced    Spouse Name: N/A    Number of Children: N/A  . Years of Education: N/A   Occupational History  . disabled    Social History Main Topics  . Smoking status: Current Every Day Smoker -- 0.50 packs/day for 30 years  Types: Cigarettes  . Smokeless tobacco: Never Used  . Alcohol Use:  183.0 oz/week    305 Glasses of wine per week     Comment: 11/15/2012 "1/2 glass of red wine ~ qd"  . Drug Use: No  . Sexual Activity: No   Other Topics Concern  . Not on file   Social History Narrative  . No narrative on file    Review of Systems:  All systems reviewed.  They are negative to the above problem except as previously stated.  Vital Signs: BP 144/78  Pulse 52  Ht 5\' 3"  (1.6 m)  Wt 203 lb (92.08 kg)  BMI 35.97 kg/m2  Physical Exam Patient is in NAD HEENT:  Normocephalic, atraumatic. EOMI, PERRLA.  Neck: JVP is normal.  No bruits.  Lungs: clear to auscultation. No rales no wheezes.  Heart: Regular rate and rhythm. Normal S1, S2. No S3.  Gr I-II/VI systolic murmurPMI not displaced.  Abdomen:  Supple, nontender. Normal bowel sounds. No masses. No hepatomegaly.  Extremities:   Good distal pulses throughout. No lower extremity edema.  Musculoskeletal :moving all extremities.  Neuro:   alert and oriented x3.  CN II-XII grossly intact. Skin:  Erythema throughout EKG  SB 52 bpm    Assessment and Plan:  1.  Atrial fibrillatoin.  Remains in SR  Keep on 200 mg amiodarone.  He is not an anticoagulation candidate with bleeding hx.  2  CP  He has had a few spells (one that led to admission)  Was in afib at time   Set up for lexiscan myoview to r/o large are of ischemia  Activity limited    3.  HTN  Patient stopped lisinoprl  Said it lead to diarrhea    4.  CAD As above  5.  Mild AS 6.  HL  Continue lipitor 7. Tobacco  Counselled on cessation 8.  .  Dermatomyositis.

## 2013-01-24 ENCOUNTER — Encounter: Payer: Self-pay | Admitting: Cardiology

## 2013-02-12 ENCOUNTER — Encounter (HOSPITAL_COMMUNITY): Payer: Self-pay

## 2013-03-01 ENCOUNTER — Encounter (HOSPITAL_COMMUNITY): Payer: Self-pay

## 2013-03-01 ENCOUNTER — Telehealth: Payer: Self-pay | Admitting: Internal Medicine

## 2013-03-01 NOTE — Telephone Encounter (Signed)
Routed to Dr. Adrian Blackwater, RN

## 2013-03-01 NOTE — Telephone Encounter (Signed)
Do you have referral form?

## 2013-03-01 NOTE — Telephone Encounter (Signed)
New message        Pt needs a referral sent from this office to [facility] [phone] to continue being a pt here.

## 2013-03-05 ENCOUNTER — Encounter (HOSPITAL_COMMUNITY): Payer: Self-pay

## 2013-03-05 NOTE — Telephone Encounter (Signed)
Spoke with pt wife, aware the referral will need to come from primary care.

## 2013-03-06 ENCOUNTER — Ambulatory Visit (HOSPITAL_COMMUNITY): Payer: Medicare HMO | Attending: Cardiovascular Disease | Admitting: Radiology

## 2013-03-06 ENCOUNTER — Encounter: Payer: Self-pay | Admitting: Cardiovascular Disease

## 2013-03-06 VITALS — BP 110/80 | Ht 63.0 in | Wt 200.0 lb

## 2013-03-06 DIAGNOSIS — R002 Palpitations: Secondary | ICD-10-CM | POA: Insufficient documentation

## 2013-03-06 DIAGNOSIS — Z8249 Family history of ischemic heart disease and other diseases of the circulatory system: Secondary | ICD-10-CM | POA: Insufficient documentation

## 2013-03-06 DIAGNOSIS — E119 Type 2 diabetes mellitus without complications: Secondary | ICD-10-CM | POA: Insufficient documentation

## 2013-03-06 DIAGNOSIS — J449 Chronic obstructive pulmonary disease, unspecified: Secondary | ICD-10-CM | POA: Insufficient documentation

## 2013-03-06 DIAGNOSIS — I10 Essential (primary) hypertension: Secondary | ICD-10-CM | POA: Insufficient documentation

## 2013-03-06 DIAGNOSIS — I4891 Unspecified atrial fibrillation: Secondary | ICD-10-CM

## 2013-03-06 DIAGNOSIS — I4949 Other premature depolarization: Secondary | ICD-10-CM

## 2013-03-06 DIAGNOSIS — I2510 Atherosclerotic heart disease of native coronary artery without angina pectoris: Secondary | ICD-10-CM

## 2013-03-06 DIAGNOSIS — I252 Old myocardial infarction: Secondary | ICD-10-CM | POA: Insufficient documentation

## 2013-03-06 DIAGNOSIS — J4489 Other specified chronic obstructive pulmonary disease: Secondary | ICD-10-CM | POA: Insufficient documentation

## 2013-03-06 DIAGNOSIS — R0602 Shortness of breath: Secondary | ICD-10-CM | POA: Insufficient documentation

## 2013-03-06 DIAGNOSIS — R079 Chest pain, unspecified: Secondary | ICD-10-CM | POA: Insufficient documentation

## 2013-03-06 DIAGNOSIS — E785 Hyperlipidemia, unspecified: Secondary | ICD-10-CM | POA: Insufficient documentation

## 2013-03-06 DIAGNOSIS — F17200 Nicotine dependence, unspecified, uncomplicated: Secondary | ICD-10-CM | POA: Insufficient documentation

## 2013-03-06 DIAGNOSIS — R569 Unspecified convulsions: Secondary | ICD-10-CM | POA: Insufficient documentation

## 2013-03-06 MED ORDER — TECHNETIUM TC 99M SESTAMIBI GENERIC - CARDIOLITE
10.0000 | Freq: Once | INTRAVENOUS | Status: AC | PRN
  Administered 2013-03-06: 10 via INTRAVENOUS

## 2013-03-06 MED ORDER — REGADENOSON 0.4 MG/5ML IV SOLN
0.4000 mg | Freq: Once | INTRAVENOUS | Status: AC
  Administered 2013-03-06: 0.4 mg via INTRAVENOUS

## 2013-03-06 MED ORDER — TECHNETIUM TC 99M SESTAMIBI GENERIC - CARDIOLITE
30.0000 | Freq: Once | INTRAVENOUS | Status: AC | PRN
  Administered 2013-03-06: 30 via INTRAVENOUS

## 2013-03-06 NOTE — Progress Notes (Signed)
    [facility] [address] [place], [place] [address] [phone]    Cardiology Nuclear Med Study  Patrick Holt is a 63 y.o. male     MRN : [id number]     DOB: 1950/12/23  Procedure Date: 03/06/2013  Nuclear Med Background Indication for Stress Test:  Evaluation for Ischemia History:  COPD and MI-'01and'03, H/O seizures, 10/18/12 ECHO: EF: 60-65% mild A. Stenosis Cardiac Risk Factors: Family History - CAD, Hypertension, Lipids, NIDDM and Smoker  Symptoms:  Chest Pain, Palpitations and SOB   Nuclear Pre-Procedure Caffeine/Decaff Intake:  None NPO After: 8:00am   Lungs:  clear O2 Sat: 96% on room air. IV 0.9% NS with Angio Cath:  22g  IV Site: R Hand  IV Started by:  Matilde Haymaker, RN  Chest Size (in):  40 Cup Size: n/a  Height: 5\' 3"  (1.6 m)  Weight:  200 lb (90.719 kg)  BMI:  Body mass index is 35.44 kg/(m^2). Tech Comments:  n/a    Nuclear Med Study 1 or 2 day study: 1 day  Stress Test Type:  Lexiscan  Reading MD: n/a  Order Authorizing Provider:  Nevin Bloodgood Ross,MD  Resting Radionuclide: Technetium 60m Sestamibi  Resting Radionuclide Dose: 11.0 mCi   Stress Radionuclide:  Technetium 80m Sestamibi  Stress Radionuclide Dose: 33.0 mCi           Stress Protocol Rest HR: 112 Stress HR: 123  Rest BP: 110/80 Stress BP: 108/75  Exercise Time (min): n/a METS: n/a   Predicted Max HR: 157 bpm % Max HR: 78.34 bpm Rate Pressure Product: 13530   Dose of Adenosine (mg):  n/a Dose of Lexiscan: 0.4 mg  Dose of Atropine (mg): n/a Dose of Dobutamine: n/a mcg/kg/min (at max HR)  Stress Test Technologist: Perrin Maltese, EMT-P  Nuclear Technologist:  Charlton Amor, CNMT     Rest Procedure:  Myocardial perfusion imaging was performed at rest 45 minutes following the intravenous administration of Technetium 38m Sestamibi. Rest ECG: Atrial fibrillation, nonspecific ST T wave abnormalities  Stress Procedure:  The patient received IV  Lexiscan 0.4 mg over 15-seconds.  Technetium 47m Sestamibi injected at 30-seconds. This patient had sob, chest, and throat tightness with the Lexiscan injection, Quantitative spect images were obtained after a 45 minute delay. Stress ECG: No significant change from baseline ECG  QPS Raw Data Images:  Normal; no motion artifact; normal heart/lung ratio. Stress Images:  Decreased basal inderior and inferolateral walls. Rest Images:  Decreased basal inderior and inferolateral walls. Subtraction (SDS):  No evidence of ischemia. Transient Ischemic Dilatation (Normal <1.22):  0.91 Lung/Heart Ratio (Normal <0.45):  0.30  Quantitative Gated Spect Images QGS EDV: NA QGS ESV:  NA  Impression Exercise Capacity:  Lexiscan with no exercise. BP Response:  Normal blood pressure response. Clinical Symptoms:  Mild chest pain/dyspnea. ECG Impression:  No significant ST segment change suggestive of ischemia. Comparison with Prior Nuclear Study: No previous nuclear study performed  Overall Impression:  Low risk stress nuclear study with fixed defect in the basal inferior and inferolateral walls that might represent an old infarct or diaphragmatic attenuation. .This was an ungated study.   LV Ejection Fraction: Study not gated.  LV Wall Motion:  NA   Patrick Holt, Patrick Holt 03/06/2013

## 2013-03-08 ENCOUNTER — Telehealth: Payer: Self-pay | Admitting: Sports Medicine

## 2013-03-08 NOTE — Telephone Encounter (Signed)
Please advise. Patrick Holt  

## 2013-03-08 NOTE — Telephone Encounter (Signed)
Has [employer]. Coverage has not  Changed- Has been told he needs a referral from PCP to [facility] for Dec visit Please advise

## 2013-03-08 NOTE — Telephone Encounter (Signed)
Says he doesn't trust Dr Dorris Carnes. Would like to have a referral to another cardiologist

## 2013-03-22 NOTE — Telephone Encounter (Signed)
Okay to change to other provider if that is what he wishes.  He should call and request a change through [place].

## 2013-03-26 ENCOUNTER — Encounter: Payer: Self-pay | Admitting: Sports Medicine

## 2013-03-26 ENCOUNTER — Ambulatory Visit (INDEPENDENT_AMBULATORY_CARE_PROVIDER_SITE_OTHER): Payer: Medicare HMO | Admitting: Sports Medicine

## 2013-03-26 VITALS — BP 160/85 | HR 50 | Temp 98.1°F | Resp 16 | Wt 205.0 lb

## 2013-03-26 DIAGNOSIS — E785 Hyperlipidemia, unspecified: Secondary | ICD-10-CM

## 2013-03-26 DIAGNOSIS — Z5181 Encounter for therapeutic drug level monitoring: Secondary | ICD-10-CM

## 2013-03-26 DIAGNOSIS — E119 Type 2 diabetes mellitus without complications: Secondary | ICD-10-CM

## 2013-03-26 DIAGNOSIS — F329 Major depressive disorder, single episode, unspecified: Secondary | ICD-10-CM

## 2013-03-26 LAB — COMPLETE METABOLIC PANEL WITH GFR
ALT: 10 U/L (ref 0–53)
AST: 14 U/L (ref 0–37)
Albumin: 4.1 g/dL (ref 3.5–5.2)
Alkaline Phosphatase: 71 U/L (ref 39–117)
BUN: 12 mg/dL (ref 6–23)
CO2: 34 meq/L — AB (ref 19–32)
Calcium: 9 mg/dL (ref 8.4–10.5)
Chloride: 104 mEq/L (ref 96–112)
Creat: 0.94 mg/dL (ref 0.50–1.35)
GFR, Est Non African American: 85 mL/min
Glucose, Bld: 81 mg/dL (ref 70–99)
Potassium: 3.4 mEq/L — ABNORMAL LOW (ref 3.5–5.3)
SODIUM: 146 meq/L — AB (ref 135–145)
TOTAL PROTEIN: 6.8 g/dL (ref 6.0–8.3)
Total Bilirubin: 0.5 mg/dL (ref 0.2–1.2)

## 2013-03-26 LAB — CBC
HCT: 47 % (ref 39.0–52.0)
HEMOGLOBIN: 15.9 g/dL (ref 13.0–17.0)
MCH: 31.9 pg (ref 26.0–34.0)
MCHC: 33.8 g/dL (ref 30.0–36.0)
MCV: 94.2 fL (ref 78.0–100.0)
Platelets: 285 10*3/uL (ref 150–400)
RBC: 4.99 MIL/uL (ref 4.22–5.81)
RDW: 13.2 % (ref 11.5–15.5)
WBC: 9.5 10*3/uL (ref 4.0–10.5)

## 2013-03-26 LAB — POCT GLYCOSYLATED HEMOGLOBIN (HGB A1C): Hemoglobin A1C: 5.1

## 2013-03-26 MED ORDER — ATORVASTATIN CALCIUM 40 MG PO TABS: 40.0000 mg | ORAL_TABLET | Freq: Every day | ORAL | Status: DC

## 2013-03-26 NOTE — Assessment & Plan Note (Signed)
Chronic, well controlled diet controlled. no changes at this time.

## 2013-03-26 NOTE — Assessment & Plan Note (Signed)
Chronic, Labile.  Today well controlled on TLC control only. Not taking lisinopril or BB but declines at this time. > consider low dose ARB for cardiac protection

## 2013-03-26 NOTE — Assessment & Plan Note (Addendum)
Chronic.  Not on appropriate therapy. Unclear as to why not on Lipitor as has been Rxd New Rx sent in

## 2013-03-26 NOTE — Patient Instructions (Signed)
   Referral back to Dr. Harrington Challenger.  You said you wanted to follow up with her and I think this is a good idea as well  Referral to Dentistry  Please START LIPITOR  We are checking labs today.  I will send you a letter with those results.    If you need anything prior to your next visit please call the clinic. Please Bring all medications or accurate medication list with you to each appointment; an accurate medication list is essential in providing you the best care possible.

## 2013-03-26 NOTE — Progress Notes (Signed)
OCTAVIOUS ZIDEK - 63 y.o. male MRN [id number]  Date of birth: [birth year]  CC & Reasons/Dx for Visit General Plan and Pt Instructions  1. DIABETES MELLITUS, TYPE II   2. Encounter for therapeutic drug monitoring   3. HYPERLIPIDEMIA   4. DEPRESSION, MAJOR       Referral back to Dr. Harrington Challenger.  You said you wanted to follow up with her and I think this is a good idea as well  Referral to Dentistry  Please START LIPITOR  We are checking labs today.  I will send you a letter with those results.      HPI, INTERVAL HISTORY & ROS    He reports overall still having poor living arrangements.  Needs referals to his Cardiologist (wants to stay with Dr. Harrington Challenger after discussing options)  All medications reviewed.  He has not gotten his Lipitor filled.  Denies recurrent chest pain, shortness of breath, worsening functional status.  Denies orthostasis, orthopnea or significant lower extremity swelling.  Overall his pain seems to be moderately well controlled and he is not requiring the use of a wheelchair.  Continues to use a cane to help in ambulation.  Denies any cards in the past week  Has been cutting down on his smoking and is now down to 4-5 cigarettes per day.  Pertinent History & Care Coordination  No Patient Care Coordination Note on file.  History  Smoking status  . Current Every Day Smoker -- 0.50 packs/day for 30 years  . Types: Cigarettes  Smokeless tobacco  . Never Used   Health Maintenance Due  Topic  . Zostavax     Recent Labs  05/26/12 1433 06/26/12 1418 10/18/12 0400 11/09/12 1035 11/20/12 1518 12/29/12 0425 03/26/13 1027  HGBA1C 4.8  --   --   --  5.3  --  5.1  TRIG  --   --   --   --   --  91  --   CHOL  --   --   --   --   --  157  --   HDL  --   --   --   --   --  51  --   LDLCALC  --   --   --   --   --  88  --   TSH  --  0.41 0.752 0.708  --   --   --        Otherwise past Medical, Surgical, Social, and Family History Reviewed per EMR Medications  and Allergies reviewed and all updated if necessary. Objective Findings  VITALS: HR: 50 bpm  BP: 160/85 mmHg  TEMP: 98.1 F (36.7 C) (Oral)  RESP: 99 %  HT:    WT: 205 lb (92.987 kg)  BMI:     BP Readings from Last 3 Encounters:  03/26/13 160/85  03/06/13 110/80  01/22/13 144/78   Wt Readings from Last 3 Encounters:  03/26/13 205 lb (92.987 kg)  03/06/13 200 lb (90.719 kg)  01/22/13 203 lb (92.08 kg)     PHYSICAL EXAM: GENERAL:  adult Caucasian slightly disheveled male. In no discomfort; no respiratory distress  PSYCH: alert and appropriate, poor insight.  Pleasant and less pressured speech then in the past.    HNEENT:  no JVD   CARDIO:  slight bradycardia, regular, no murmur   LUNGS:  clear to auscultation bilaterally   ABDOMEN:   EXTREM:  Warm, well perfused.  Moves all 4 extremities spontaneously; no  lateralization.  Distal pulses 1/4.  trace pretibial edema.  GU:   SKIN:     Medications, Labs & Other Orders   Previous Medications   ALPRAZOLAM (XANAX) 1 MG TABLET    Take 1 tablet (1 mg total) by mouth at bedtime as needed for anxiety.   AMIODARONE (PACERONE) 200 MG TABLET    Take 1 tablet (200 mg total) by mouth daily before breakfast.   ASPIRIN EC 81 MG EC TABLET    Take 1 tablet (81 mg total) by mouth daily.   DIVALPROEX (DEPAKOTE ER) 500 MG 24 HR TABLET    Take 1 tablet (500 mg total) by mouth 2 (two) times daily.   ESOMEPRAZOLE (NEXIUM) 40 MG CAPSULE    Take 1 capsule (40 mg total) by mouth daily before breakfast.   IPRATROPIUM-ALBUTEROL (COMBIVENT) 20-100 MCG/ACT AERS RESPIMAT    Inhale 1 puff into the lungs every 6 (six) hours.   LEVETIRACETAM (KEPPRA) 500 MG TABLET    Take 1 tablet (500 mg total) by mouth 2 (two) times daily.   LOVASTATIN (MEVACOR) 40 MG TABLET       MULTIPLE VITAMIN (MULTIVITAMIN WITH MINERALS) TABS TABLET    Take 1 tablet by mouth daily.   NITROGLYCERIN (NITROSTAT) 0.4 MG SL TABLET    Place 0.4 mg under the tongue every 5 (five) minutes as  needed. For chest pain   OXYBUTYNIN (DITROPAN) 5 MG TABLET    Take 1 tablet (5 mg total) by mouth daily.   RISPERIDONE (RISPERDAL) 1 MG TABLET    Take 1 tablet (1 mg total) by mouth at bedtime.   ROPINIROLE (REQUIP) 2 MG TABLET    Take 1 tablet (2 mg total) by mouth 3 (three) times daily.   TORSEMIDE (DEMADEX) 20 MG TABLET    Take 1 tablet (20 mg total) by mouth daily.   Modified Medications   Modified Medication Previous Medication   ATORVASTATIN (LIPITOR) 40 MG TABLET atorvastatin (LIPITOR) 40 MG tablet      Take 1 tablet (40 mg total) by mouth daily.    Take 1 tablet (40 mg total) by mouth daily.   New Prescriptions   No medications on file   Discontinued Medications   LISINOPRIL (PRINIVIL,ZESTRIL) 10 MG TABLET       Orders Placed This Encounter  Procedures  . CBC  . COMPLETE METABOLIC PANEL WITH GFR  . Valproic acid level  . Ambulatory referral to Cardiology  . Ambulatory referral to Dentistry  . HgB A1c   Assessment & Plan  As above & for further discussion see problem based charting if applicable.

## 2013-03-27 ENCOUNTER — Telehealth: Payer: Self-pay | Admitting: Sports Medicine

## 2013-03-27 LAB — VALPROIC ACID LEVEL: VALPROIC ACID LVL: 7.8 ug/mL — AB (ref 50.0–100.0)

## 2013-03-27 NOTE — Telephone Encounter (Signed)
Patient denies wanting to change cardiologist.. Bhavana Kady, Lewie Loron

## 2013-03-27 NOTE — Telephone Encounter (Signed)
Pt is calling back because he needs a list of the dental offices that will take the [place] insurance plan. It's not insurance but a company the offers discounted prices for [employer]. jw

## 2013-03-27 NOTE — Telephone Encounter (Signed)
Spoke with patient and informed him that he will need to find his own dentisit. This is a Paediatric nurse through [employer]

## 2013-03-29 NOTE — Assessment & Plan Note (Signed)
No plans for changes at this time.  Is not seeing psychiatry. Check Depakote levels and basic labs

## 2013-04-02 ENCOUNTER — Encounter: Payer: Self-pay | Admitting: Sports Medicine

## 2013-04-02 ENCOUNTER — Telehealth: Payer: Self-pay | Admitting: Sports Medicine

## 2013-04-02 MED ORDER — POTASSIUM CHLORIDE CRYS ER 20 MEQ PO TBCR: 20.0000 meq | EXTENDED_RELEASE_TABLET | Freq: Every day | ORAL | Status: DC

## 2013-04-02 NOTE — Telephone Encounter (Signed)
Please call and inform pt that all labs looked good but his potassium was slightly low and I would like for him to start a potassium supplement daily when he can.   I have sent in an Rx for him.

## 2013-04-06 NOTE — Telephone Encounter (Signed)
Mr. Hollars called to request results of last labs.  Have been waiting since last month to get results.

## 2013-04-06 NOTE — Telephone Encounter (Signed)
Please see other phone note & Call pt

## 2013-04-09 NOTE — Telephone Encounter (Signed)
Spoke with patient and he was informed that letter for labs are being sent out one more time

## 2013-05-04 ENCOUNTER — Ambulatory Visit: Payer: Self-pay | Admitting: Internal Medicine

## 2013-05-06 IMAGING — CR DG PELVIS 1-2V
1 series · 1 of 1 positions shown · non-contrast
Comparison: CT abdomen and pelvis of 07/15/2009

CLINICAL DATA: Fell several weeks ago with posterior right hip pain

PELVIS - 1-2 VIEW

[t pelvis a.p.]
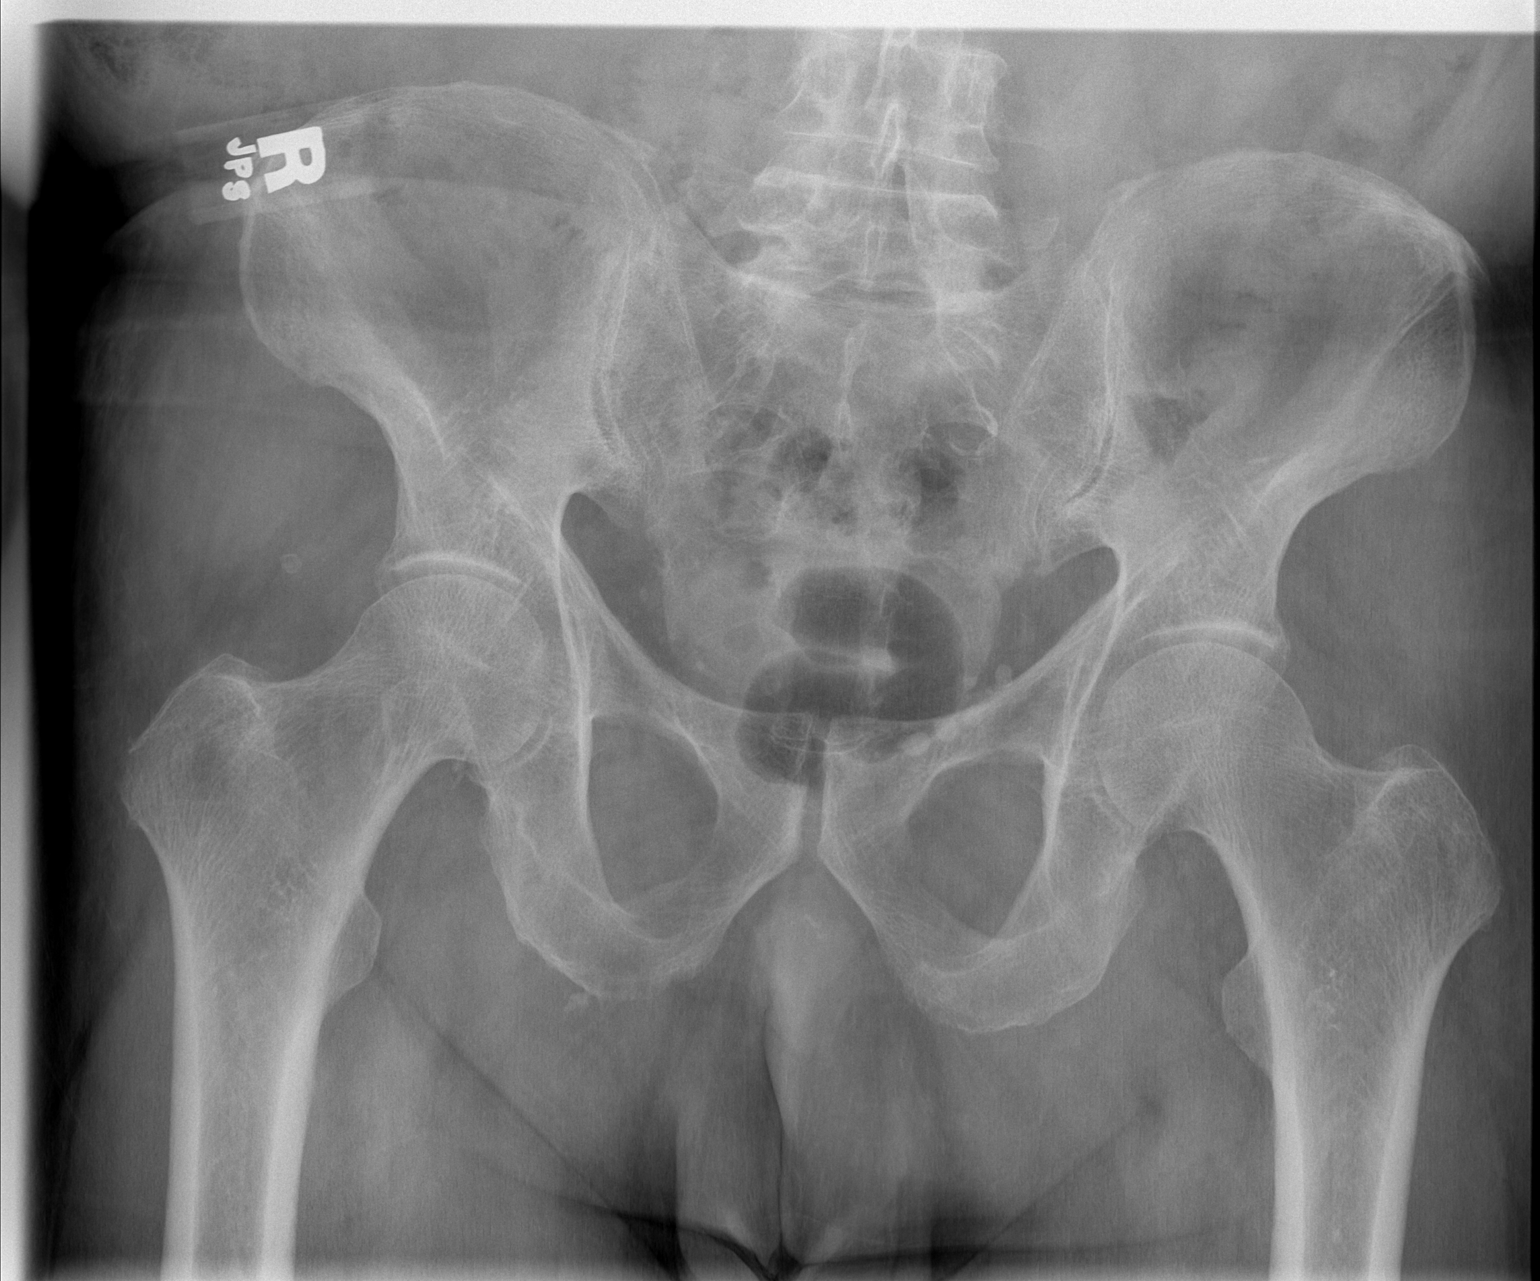

[1 of 1 positions shown; findings below may reference images not displayed]

FINDINGS: Both hips appear to be normally positioned.  The pelvic
rami are intact.  The SI joints are unremarkable.  No acute
fracture is seen.
IMPRESSION: No fracture.

## 2013-05-06 IMAGING — CR DG HIP COMPLETE 2+V*R*
2 series · 2 of 2 positions shown · non-contrast
Comparison: Right hip films of 11/05/2008

CLINICAL DATA: Fell several weeks ago with posterior right hip pain

RIGHT HIP - COMPLETE 2+ VIEW

[t hip ap right]
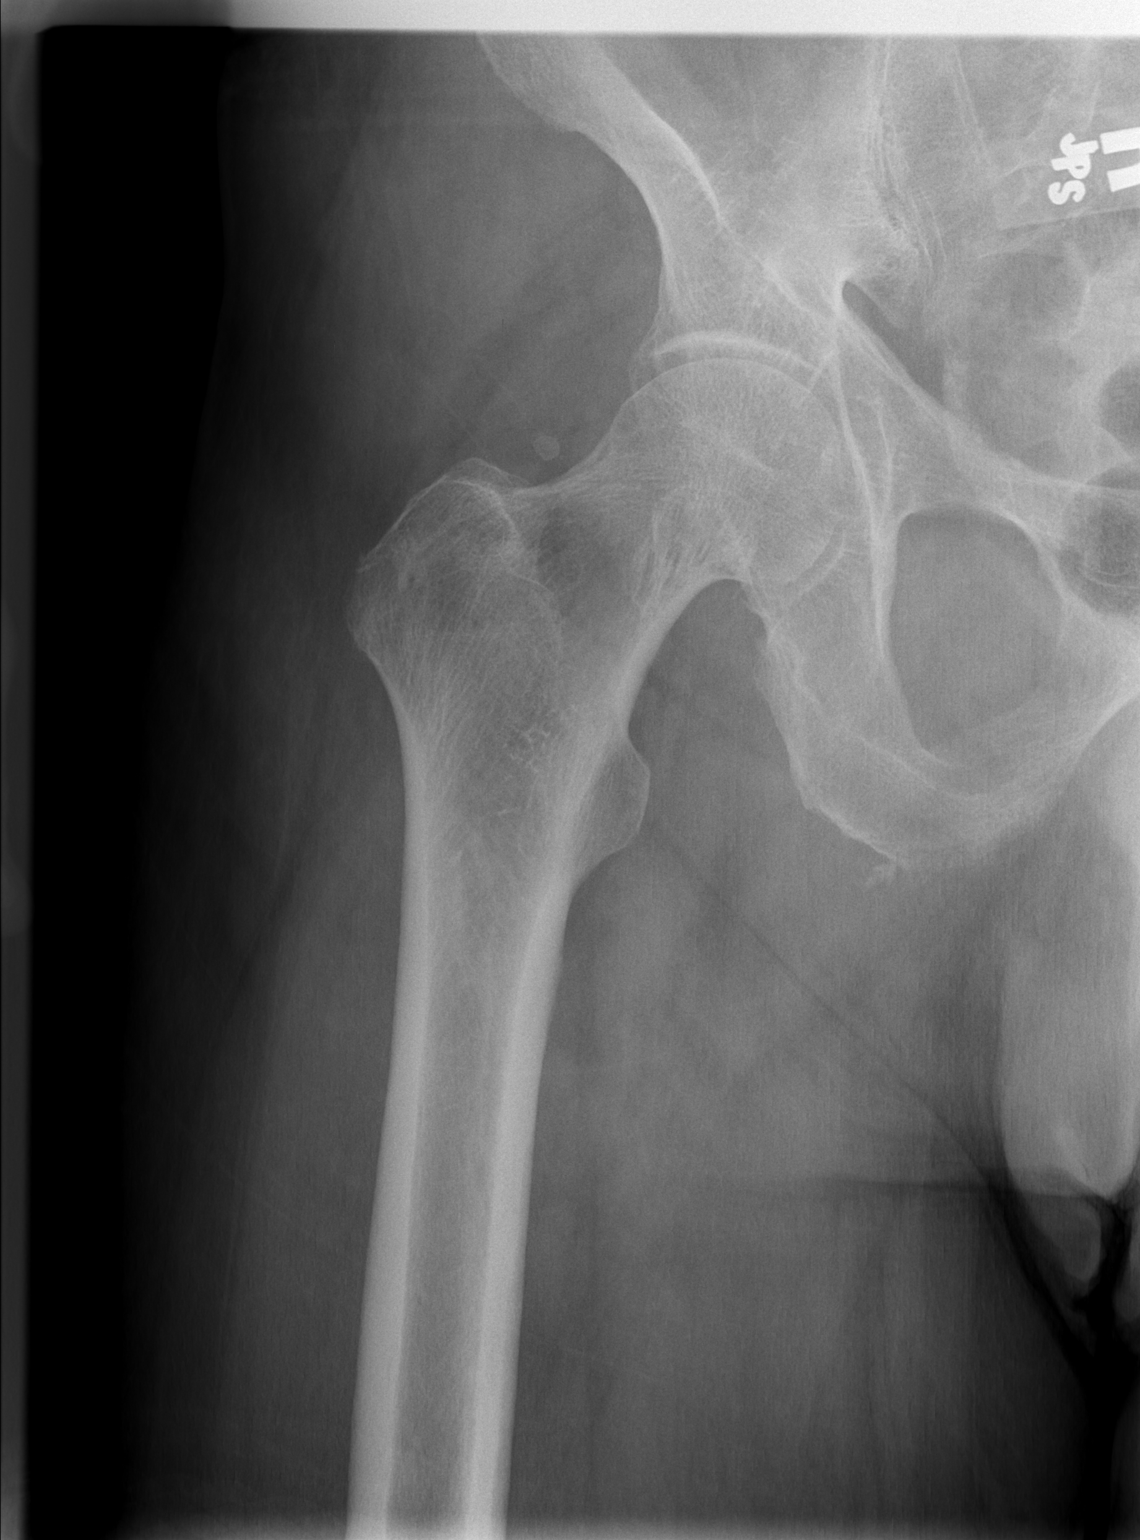

[t hip frog leg right]
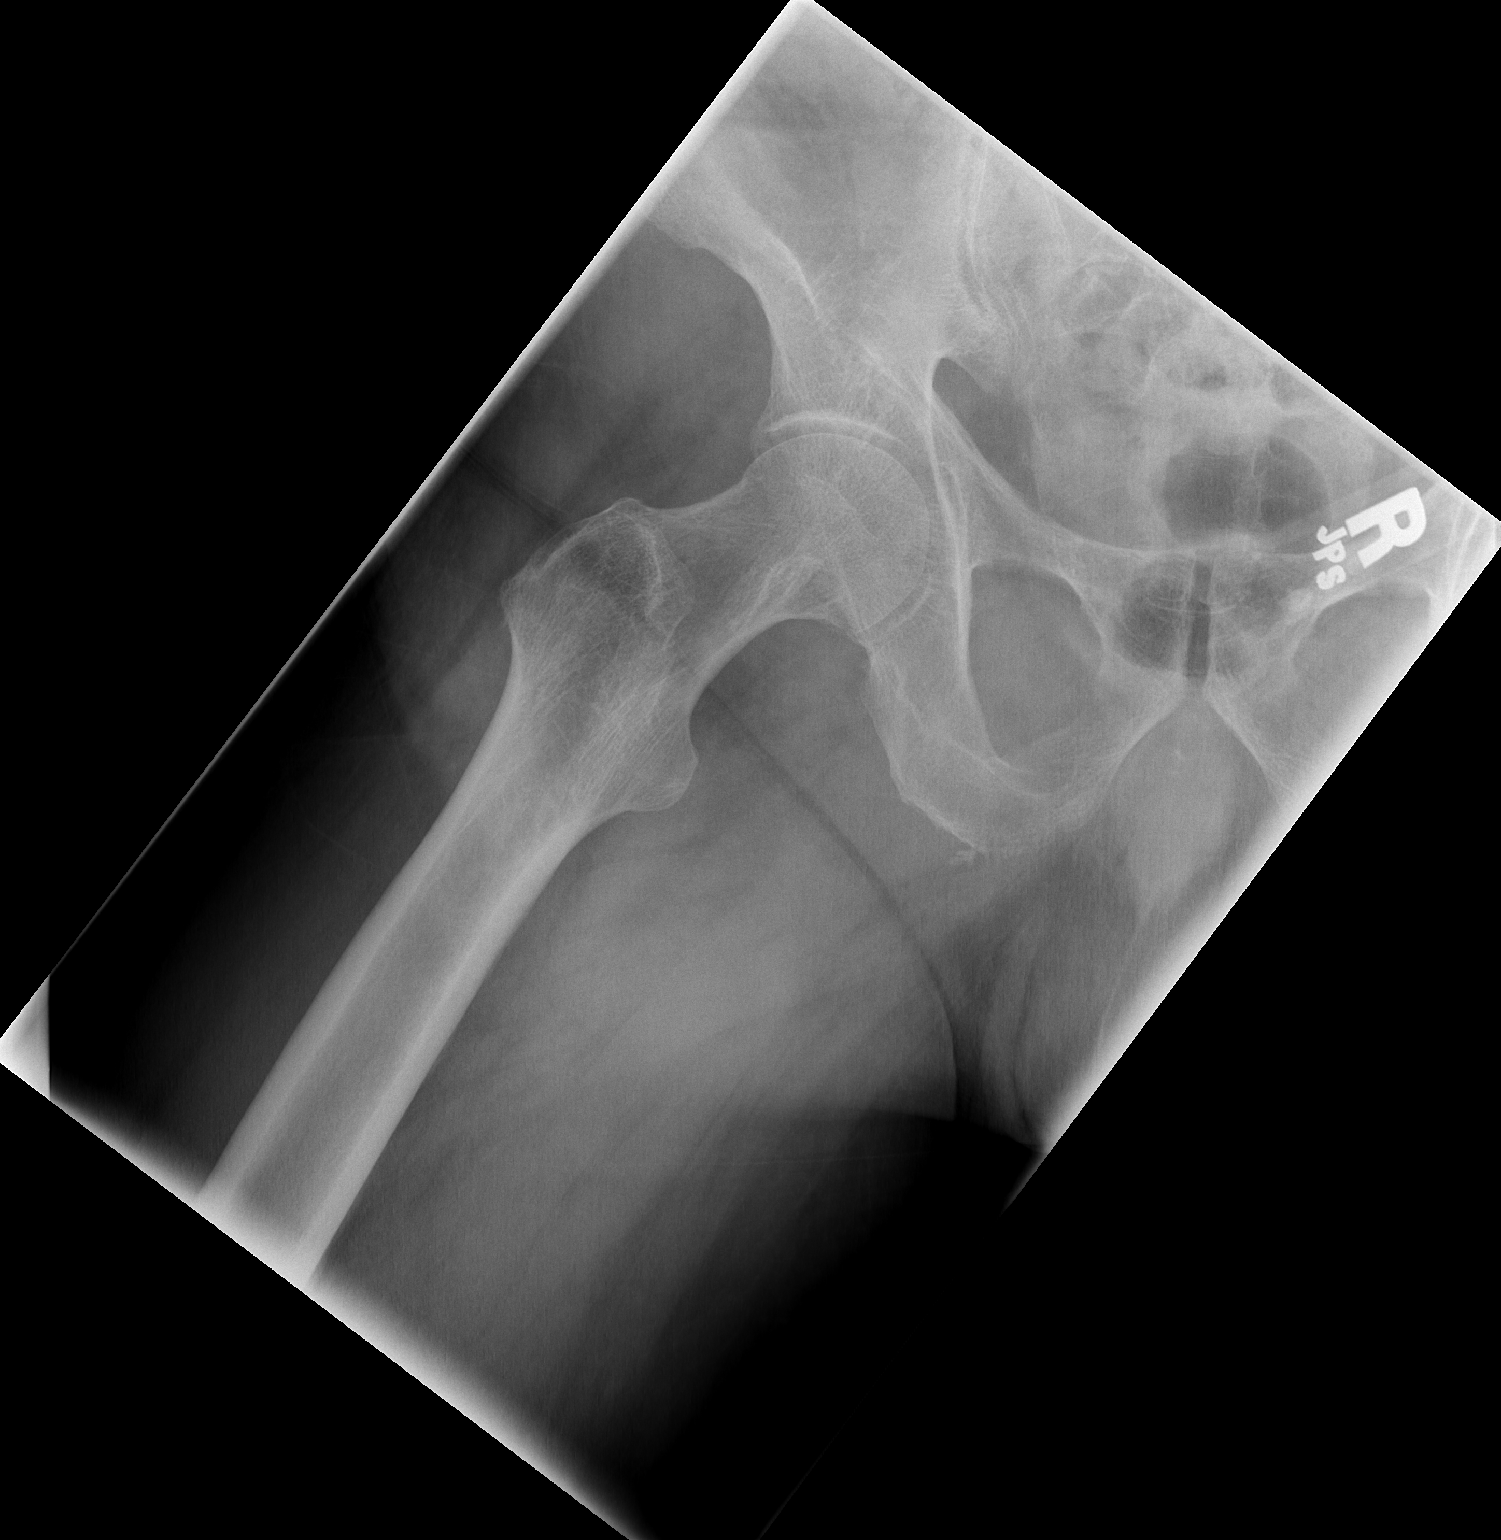

[2 of 2 positions shown; findings below may reference images not displayed]

FINDINGS: No acute fracture is seen.  Only minimal degenerative
change is noted.  The right ramus appears intact.
IMPRESSION: No acute fracture.

## 2013-05-10 ENCOUNTER — Encounter (HOSPITAL_COMMUNITY): Payer: Self-pay | Admitting: Emergency Medicine

## 2013-05-10 DIAGNOSIS — Z8619 Personal history of other infectious and parasitic diseases: Secondary | ICD-10-CM | POA: Insufficient documentation

## 2013-05-10 DIAGNOSIS — K2970 Gastritis, unspecified, without bleeding: Secondary | ICD-10-CM | POA: Insufficient documentation

## 2013-05-10 DIAGNOSIS — Z87442 Personal history of urinary calculi: Secondary | ICD-10-CM | POA: Insufficient documentation

## 2013-05-10 DIAGNOSIS — K5732 Diverticulitis of large intestine without perforation or abscess without bleeding: Secondary | ICD-10-CM | POA: Insufficient documentation

## 2013-05-10 DIAGNOSIS — G40909 Epilepsy, unspecified, not intractable, without status epilepticus: Secondary | ICD-10-CM | POA: Insufficient documentation

## 2013-05-10 DIAGNOSIS — E669 Obesity, unspecified: Secondary | ICD-10-CM | POA: Insufficient documentation

## 2013-05-10 DIAGNOSIS — Z79899 Other long term (current) drug therapy: Secondary | ICD-10-CM | POA: Insufficient documentation

## 2013-05-10 DIAGNOSIS — E785 Hyperlipidemia, unspecified: Secondary | ICD-10-CM | POA: Insufficient documentation

## 2013-05-10 DIAGNOSIS — J449 Chronic obstructive pulmonary disease, unspecified: Secondary | ICD-10-CM | POA: Insufficient documentation

## 2013-05-10 DIAGNOSIS — K219 Gastro-esophageal reflux disease without esophagitis: Secondary | ICD-10-CM | POA: Insufficient documentation

## 2013-05-10 DIAGNOSIS — Z860100 Personal history of colon polyps, unspecified: Secondary | ICD-10-CM | POA: Insufficient documentation

## 2013-05-10 DIAGNOSIS — Z880 Allergy status to penicillin: Secondary | ICD-10-CM | POA: Insufficient documentation

## 2013-05-10 DIAGNOSIS — Z7982 Long term (current) use of aspirin: Secondary | ICD-10-CM | POA: Insufficient documentation

## 2013-05-10 DIAGNOSIS — Z9889 Other specified postprocedural states: Secondary | ICD-10-CM | POA: Insufficient documentation

## 2013-05-10 DIAGNOSIS — I252 Old myocardial infarction: Secondary | ICD-10-CM | POA: Insufficient documentation

## 2013-05-10 DIAGNOSIS — IMO0001 Reserved for inherently not codable concepts without codable children: Secondary | ICD-10-CM | POA: Insufficient documentation

## 2013-05-10 DIAGNOSIS — I4891 Unspecified atrial fibrillation: Secondary | ICD-10-CM | POA: Insufficient documentation

## 2013-05-10 DIAGNOSIS — F323 Major depressive disorder, single episode, severe with psychotic features: Secondary | ICD-10-CM | POA: Insufficient documentation

## 2013-05-10 DIAGNOSIS — J4489 Other specified chronic obstructive pulmonary disease: Secondary | ICD-10-CM | POA: Insufficient documentation

## 2013-05-10 DIAGNOSIS — Z862 Personal history of diseases of the blood and blood-forming organs and certain disorders involving the immune mechanism: Secondary | ICD-10-CM | POA: Insufficient documentation

## 2013-05-10 DIAGNOSIS — I10 Essential (primary) hypertension: Secondary | ICD-10-CM | POA: Insufficient documentation

## 2013-05-10 DIAGNOSIS — F17200 Nicotine dependence, unspecified, uncomplicated: Secondary | ICD-10-CM | POA: Insufficient documentation

## 2013-05-10 DIAGNOSIS — F411 Generalized anxiety disorder: Secondary | ICD-10-CM | POA: Insufficient documentation

## 2013-05-10 DIAGNOSIS — K2990 Gastroduodenitis, unspecified, without bleeding: Secondary | ICD-10-CM

## 2013-05-10 DIAGNOSIS — M069 Rheumatoid arthritis, unspecified: Secondary | ICD-10-CM | POA: Insufficient documentation

## 2013-05-10 LAB — CBC
HEMATOCRIT: 44.2 % (ref 39.0–52.0)
Hemoglobin: 15.5 g/dL (ref 13.0–17.0)
MCH: 31.8 pg (ref 26.0–34.0)
MCHC: 35.1 g/dL (ref 30.0–36.0)
MCV: 90.6 fL (ref 78.0–100.0)
Platelets: 253 10*3/uL (ref 150–400)
RBC: 4.88 MIL/uL (ref 4.22–5.81)
RDW: 13.8 % (ref 11.5–15.5)
WBC: 13.3 10*3/uL — ABNORMAL HIGH (ref 4.0–10.5)

## 2013-05-10 LAB — BASIC METABOLIC PANEL
BUN: 15 mg/dL (ref 6–23)
CALCIUM: 9 mg/dL (ref 8.4–10.5)
CO2: 26 mEq/L (ref 19–32)
CREATININE: 0.97 mg/dL (ref 0.50–1.35)
Chloride: 103 mEq/L (ref 96–112)
GFR calc Af Amer: >90 mL/min (ref >90)
GFR, EST NON AFRICAN AMERICAN: 85 mL/min — AB (ref >90)
Glucose, Bld: 90 mg/dL (ref 70–99)
Potassium: 3.6 mEq/L — ABNORMAL LOW (ref 3.7–5.3)
Sodium: 142 mEq/L (ref 137–147)

## 2013-05-10 LAB — I-STAT TROPONIN, ED: Troponin i, poc: 0 ng/mL (ref 0.00–0.08)

## 2013-05-10 NOTE — ED Notes (Signed)
Arrives via EMS from home. Patient here with numerous complaints tonight. Primary complaint is left sided chest pain which worsens with palpation and lower abdominal pain which starts at the left side and moves to the right. 1 Episode of emesis reported.

## 2013-05-11 ENCOUNTER — Emergency Department (HOSPITAL_COMMUNITY): Payer: Medicare HMO

## 2013-05-11 ENCOUNTER — Emergency Department (HOSPITAL_COMMUNITY)
Admission: EM | Admit: 2013-05-11 | Discharge: 2013-05-11 | Disposition: A | Discharge status: Home / Self Care | Payer: Medicare HMO | Attending: Emergency Medicine | Admitting: Emergency Medicine

## 2013-05-11 ENCOUNTER — Encounter (HOSPITAL_COMMUNITY): Payer: Self-pay | Admitting: Radiology

## 2013-05-11 ENCOUNTER — Telehealth: Payer: Self-pay | Admitting: Sports Medicine

## 2013-05-11 ENCOUNTER — Telehealth: Payer: Self-pay | Admitting: Internal Medicine

## 2013-05-11 DIAGNOSIS — K5792 Diverticulitis of intestine, part unspecified, without perforation or abscess without bleeding: Secondary | ICD-10-CM

## 2013-05-11 DIAGNOSIS — K5732 Diverticulitis of large intestine without perforation or abscess without bleeding: Secondary | ICD-10-CM

## 2013-05-11 MED ORDER — IOHEXOL 300 MG/ML  SOLN
80.0000 mL | Freq: Once | INTRAMUSCULAR | Status: AC | PRN
  Administered 2013-05-11: 80 mL via INTRAVENOUS

## 2013-05-11 MED ORDER — ONDANSETRON HCL 4 MG PO TABS: 8.0000 mg | ORAL_TABLET | Freq: Four times a day (QID) | ORAL | Status: DC

## 2013-05-11 MED ORDER — LEVOFLOXACIN 500 MG PO TABS: 500.0000 mg | ORAL_TABLET | Freq: Every day | ORAL | Status: DC

## 2013-05-11 MED ORDER — IOHEXOL 300 MG/ML  SOLN
25.0000 mL | Freq: Once | INTRAMUSCULAR | Status: AC | PRN
  Administered 2013-05-11: 25 mL via ORAL

## 2013-05-11 MED ORDER — LEVOFLOXACIN 750 MG PO TABS
750.0000 mg | ORAL_TABLET | Freq: Once | ORAL | Status: AC
  Administered 2013-05-11: 750 mg via ORAL
  Filled 2013-05-11: qty 1

## 2013-05-11 MED ORDER — OXYCODONE-ACETAMINOPHEN 5-325 MG PO TABS
1.0000 | ORAL_TABLET | Freq: Once | ORAL | Status: AC
  Administered 2013-05-11: 1 via ORAL
  Filled 2013-05-11: qty 1

## 2013-05-11 MED ORDER — OXYCODONE-ACETAMINOPHEN 5-325 MG PO TABS: 1.0000 | ORAL_TABLET | ORAL | Status: DC | PRN

## 2013-05-11 MED ORDER — MORPHINE SULFATE 4 MG/ML IJ SOLN
4.0000 mg | Freq: Once | INTRAMUSCULAR | Status: AC
  Administered 2013-05-11: 4 mg via INTRAVENOUS
  Filled 2013-05-11: qty 1

## 2013-05-11 NOTE — Telephone Encounter (Signed)
CT scan last night confirms inflammatory process along sigmoid colon, I agree with OV next week, He needs to start on Antibiotics and stay on clear liquids till he feels better. Last clolon 09/2012, not due for recall yet.

## 2013-05-11 NOTE — Telephone Encounter (Signed)
Pt called and would like a referral to see Dr. Maurene Capes on [address]. He said that he was in the ER and they told him to go there. Please call him at [phone]. Blima Rich

## 2013-05-11 NOTE — Telephone Encounter (Signed)
Patient calling to report he went to ED last night for abdominal pain. States he was told he has  "diverticulitis and polyps." Patient states he usually goes into the hospital. He did received rx for antibiotics which he has not gotten yet. He states he will have to have them delivered and his pharmacy is not open yet. Explained to patient the need to get antibiotics started as soon as possible. He insists he have an OV early next week. Scheduled with Alonza Bogus, PA on 05/14/13 at 10:30 AM.

## 2013-05-11 NOTE — Telephone Encounter (Signed)
Spoke with patient and gave him Dr. Nichola Sizer recommendations. He will keep Monday OV.

## 2013-05-11 NOTE — Progress Notes (Signed)
   CARE MANAGEMENT ED NOTE 05/11/2013  Patient:  Patrick Holt, Patrick Holt   Account Number:  [id number]  Date Initiated:  05/11/2013  Documentation initiated by:  Edwyna Shell  Subjective/Objective Assessment:   63 YO male that was discharged from the [facility] ED and was seen by Dr. Venora Maples. Received a call from the pharmacy for clarification of the med orders.     Subjective/Objective Assessment Detail:     Action/Plan:   Spoke with Claiborne Billings, pharmacist, at [facility] and she requested verbal clarification of the ED prescriptions received by the patient so they could then be filled and delivered. The EPIC note was read and the medication prescriptions were read.   Action/Plan Detail:   This RN then called Claiborne Billings back with clarification of the quantity for each med. Claiborne Billings then stated that the script for the percocet would be picked up when the zofran and levaquin are delivered prior to filling and being delivered   Anticipated DC Date:       Status Recommendation to Physician:   Result of Recommendation:      [place]  Other    Choice offered to / List presented to:            Status of service:    ED Comments:   ED Comments Detail:

## 2013-05-11 NOTE — ED Notes (Signed)
Patient transported to X-ray 

## 2013-05-11 NOTE — ED Provider Notes (Signed)
CSN: [id number]     Arrival date & time 05/10/13  2249 History   First MD Initiated Contact with Patient 05/11/13 0148     Chief Complaint  Patient presents with  . Chest Pain  . Abdominal Pain      HPI Pt reports worsening lower abdominal pain x 2 days. Nausea without vomiting. Chills without fever. No urinary complaints. No constipation. No diarrhea. abd pain is constant. Located in left lower abd. Occasional CP without radiation. Worse with palpation and movement. Symptoms are moderate in severity   Past Medical History  Diagnosis Date  . Rheumatoid arthritis(714.0)   . Obesity   . Major depression     with acute psychotic break in 06/2010  . Hypertension   . Hyperlipidemia   . Diverticulosis of colon   . COPD (chronic obstructive pulmonary disease)   . Anxiety   . GERD (gastroesophageal reflux disease)   . Vertigo   . Fibromyalgia   . Dermatomyositis   . Myocardial infarct     mulitple (1999, 2000, 2004)  . Raynaud's disease   . Narcotic dependence   . Peripheral neuropathy   . Internal hemorrhoids   . Ischemic heart disease   . Hiatal hernia   . Gastritis   . Diverticulitis   . Hx of adenomatous colonic polyps   . Nephrolithiasis   . Anemia   . Esophageal stricture   . Esophageal dysmotility   . Dermatomyositis   . Urge incontinence   . Otosclerosis   . Bipolar 1 disorder   . OCD (obsessive compulsive disorder)   . Sarcoidosis   . Paroxysmal a-fib   . Dysrhythmia     "irregular" (11/15/2012)  . Type II diabetes mellitus   . Seizures   . Headache(784.0)     "severe; get them often" (11/15/2012)   Past Surgical History  Procedure Laterality Date  . Knee arthroscopy w/ meniscal repair Left 2009  . Lumbar disc surgery    . Squamous papilloma   2010    removed by Dr. Constance Holster ENT, noted on tongue  . Esophagogastroduodenoscopy N/A 09/27/2012    Procedure: ESOPHAGOGASTRODUODENOSCOPY (EGD);  Surgeon: Lafayette Dragon, MD;  Location: [facility];  Service:  Endoscopy;  Laterality: N/A;  . Colonoscopy N/A 09/27/2012    Procedure: COLONOSCOPY;  Surgeon: Lafayette Dragon, MD;  Location: [facility];  Service: Endoscopy;  Laterality: N/A;  . Cataract extraction w/ intraocular lens implant Left   . Lymph node dissection Right 1970's    "neck; dr thought I had Hodgkins; turned out to be sarcoidosis" (11/15/2012)  . Tonsillectomy    . Carpal tunnel release Bilateral   . Cardiac catheterization     Family History  Problem Relation Age of Onset  . Alcohol abuse Mother   . Depression Mother   . Heart disease Mother   . Diabetes Mother   . Stroke Mother   . Diabetes Other     1/2 brother  . Hepatitis Brother    History  Substance Use Topics  . Smoking status: Current Every Day Smoker -- 0.50 packs/day for 30 years    Types: Cigarettes  . Smokeless tobacco: Never Used  . Alcohol Use: No     Comment: Alcohol stopped in September of 2014    Review of Systems  All other systems reviewed and are negative.      Allergies  Betamethasone dipropionate; Bupropion hcl; Ciprofloxacin; Clobetasol; Codeine; Escitalopram oxalate; Flagyl; Fluoxetine hcl; Fluticasone-salmeterol; Furosemide; Immune globulins; Paroxetine; Penicillins; Sulfa  antibiotics; Tacrolimus; Tetanus toxoid; and Tuberculin purified protein derivative  Home Medications   Current Outpatient Rx  Name  Route  Sig  Dispense  Refill  . albuterol-ipratropium (COMBIVENT) 18-103 MCG/ACT inhaler   Inhalation   Inhale 1 puff into the lungs every 6 (six) hours as needed for wheezing or shortness of breath.         . ALPRAZolam (XANAX) 1 MG tablet   Oral   Take 1 tablet (1 mg total) by mouth at bedtime as needed for anxiety.   90 tablet   1   . amiodarone (PACERONE) 200 MG tablet   Oral   Take 1 tablet (200 mg total) by mouth daily before breakfast.   90 tablet   1   . aspirin EC 81 MG EC tablet   Oral   Take 1 tablet (81 mg total) by mouth daily.         Marland Kitchen atorvastatin  (LIPITOR) 40 MG tablet   Oral   Take 1 tablet (40 mg total) by mouth daily.   90 tablet   1   . divalproex (DEPAKOTE ER) 500 MG 24 hr tablet   Oral   Take 1 tablet (500 mg total) by mouth 2 (two) times daily.   180 tablet   1   . esomeprazole (NEXIUM) 40 MG capsule   Oral   Take 1 capsule (40 mg total) by mouth daily before breakfast.   90 capsule   1   . levETIRAcetam (KEPPRA) 500 MG tablet   Oral   Take 1 tablet (500 mg total) by mouth 2 (two) times daily.   180 tablet   1   . Multiple Vitamin (MULTIVITAMIN WITH MINERALS) TABS tablet   Oral   Take 1 tablet by mouth daily.   90 tablet   3   . nitroGLYCERIN (NITROSTAT) 0.4 MG SL tablet   Sublingual   Place 0.4 mg under the tongue every 5 (five) minutes as needed. For chest pain         . oxybutynin (DITROPAN) 5 MG tablet   Oral   Take 1 tablet (5 mg total) by mouth daily.   90 tablet   1   . potassium chloride SA (K-DUR,KLOR-CON) 20 MEQ tablet   Oral   Take 1 tablet (20 mEq total) by mouth daily.   90 tablet   1   . risperiDONE (RISPERDAL) 1 MG tablet   Oral   Take 1 tablet (1 mg total) by mouth at bedtime.   90 tablet   1   . torsemide (DEMADEX) 20 MG tablet   Oral   Take 1 tablet (20 mg total) by mouth daily.   90 tablet   1   . levofloxacin (LEVAQUIN) 500 MG tablet   Oral   Take 1 tablet (500 mg total) by mouth daily.   10 tablet   0   . ondansetron (ZOFRAN) 4 MG tablet   Oral   Take 2 tablets (8 mg total) by mouth every 6 (six) hours.   12 tablet   0    BP 105/65  Pulse 71  Temp(Src) 100 F (37.8 C) (Oral)  Resp 19  SpO2 92% Physical Exam  Nursing note and vitals reviewed. Constitutional: He is oriented to person, place, and time. He appears well-developed and well-nourished.  HENT:  Head: Normocephalic and atraumatic.  Eyes: EOM are normal.  Neck: Normal range of motion.  Cardiovascular: Normal rate, regular rhythm, normal heart sounds and intact  distal pulses.    Pulmonary/Chest: Effort normal and breath sounds normal. No respiratory distress.  Abdominal: Soft. He exhibits no distension.  Left lower quadrant abdominal tenderness without guarding or rebound  Musculoskeletal: Normal range of motion.  Neurological: He is alert and oriented to person, place, and time.  Skin: Skin is warm and dry.  Psychiatric: He has a normal mood and affect. Judgment normal.    ED Course  Procedures (including critical care time) Labs Review Labs Reviewed  CBC - Abnormal; Notable for the following:    WBC 13.3 (*)    All other components within normal limits  BASIC METABOLIC PANEL - Abnormal; Notable for the following:    Potassium 3.6 (*)    GFR calc non Af Amer 85 (*)    All other components within normal limits  Randolm Idol, ED   Imaging Review Dg Chest 2 View  05/11/2013   CLINICAL DATA:  Left-sided chest pain.  EXAM: CHEST  2 VIEW  COMPARISON:  Chest x-ray 12/30/2012.  FINDINGS: Linear opacities in lung bases bilaterally most compatible with areas of subsegmental atelectasis or scarring. No consolidative airspace disease. No pleural effusions. No evidence of pulmonary edema. Heart size is normal. Mediastinal contours are unremarkable.  IMPRESSION: 1. No radiographic evidence of acute cardiopulmonary disease. 2. Minimal bibasilar subsegmental atelectasis or scarring.   Electronically Signed   By: Vinnie Langton M.D.   On: 05/11/2013 02:44   Ct Abdomen Pelvis W Contrast  05/11/2013   CLINICAL DATA:  Left-sided chest pain.  Lower abdominal pain.  EXAM: CT ABDOMEN AND PELVIS WITH CONTRAST  TECHNIQUE: Multidetector CT imaging of the abdomen and pelvis was performed using the standard protocol following bolus administration of intravenous contrast.  CONTRAST:  34mL OMNIPAQUE IOHEXOL 300 MG/ML  SOLN  COMPARISON:  CT of the abdomen and pelvis 07/15/2009.  FINDINGS: Lung Bases: Dependent areas of scarring and/or atelectasis in the lower lobes of the lungs  bilaterally. Atherosclerotic calcifications in the left anterior descending and right coronary arteries.  Abdomen/Pelvis: The appearance of the liver, gallbladder, pancreas, spleen and bilateral adrenal glands is unremarkable. Several low-attenuation lesions in the kidneys bilaterally are noted, the majority of which are too small to definitively characterize. The larger of the renal lesions are all compatible with simple cysts, with the largest of these measuring 5.7 cm in diameter in the upper pole the right kidney. Atherosclerosis throughout the abdominal and pelvic vasculature, without evidence of aneurysm or dissection.  Colonic diverticulosis. Adjacent to several colonic diverticulae in the region of the sigmoid colon there is extensive soft tissue stranding and hypervascularity, compatible with acute diverticulitis. No well-defined fluid collection is identified at this time to suggest presence of diverticular abscess. No pneumoperitoneum. No significant volume of ascites. No pathologic distention of small bowel. Normal appendix. Prostate gland and urinary bladder are unremarkable in appearance.  Musculoskeletal: There are no aggressive appearing lytic or blastic lesions noted in the visualized portions of the skeleton.  IMPRESSION: 1. Acute diverticulitis of the sigmoid colon. No diverticular abscess or signs to suggest frank perforation are noted at this time. 2. Normal appendix. 3. Extensive atherosclerosis, including at least 2 vessel coronary artery disease. Please note that although the presence of coronary artery calcium documents the presence of coronary artery disease, the severity of this disease and any potential stenosis cannot be assessed on this non-gated CT examination. Assessment for potential risk factor modification, dietary therapy or pharmacologic therapy may be warranted, if clinically indicated. 4. Additional incidental findings,  as above. These results were called by telephone at the  time of interpretation on 05/11/2013 at 3:39 AM to Dr. Jola Schmidt, who verbally acknowledged these results.   Electronically Signed   By: Vinnie Langton M.D.   On: 05/11/2013 03:40  I personally reviewed the imaging tests through PACS system I reviewed available ER/hospitalization records through the EMR    EKG Interpretation   Date/Time:  Thursday May 10 2013 22:55:24 EDT Ventricular Rate:  77 PR Interval:  170 QRS Duration: 88 QT Interval:  404 QTC Calculation: 457 R Axis:   27 Text Interpretation:  Normal sinus rhythm Nonspecific ST abnormality  Abnormal ECG No significant change was found Confirmed by Raevin Wierenga  MD,  Jourdyn Hasler ([id number]) on 05/11/2013 2:25:02 AM      MDM   Final diagnoses:  Diverticulitis    Diverticulitis without complicating factors. Tolerating oral fluids. Dc home in good condition  New Prescriptions   LEVOFLOXACIN (LEVAQUIN) 500 MG TABLET    Take 1 tablet (500 mg total) by mouth daily.   ONDANSETRON (ZOFRAN) 4 MG TABLET    Take 2 tablets (8 mg total) by mouth every 6 (six) hours.       Hoy Morn, MD 05/11/13 [phone]

## 2013-05-11 NOTE — Discharge Instructions (Signed)
Diverticulitis °A diverticulum is a small pouch or sac on the colon. Diverticulosis is the presence of these diverticula on the colon. Diverticulitis is the irritation (inflammation) or infection of diverticula. °CAUSES  °The colon and its diverticula contain bacteria. If food particles block the tiny opening to a diverticulum, the bacteria inside can grow and cause an increase in pressure. This leads to infection and inflammation and is called diverticulitis. °SYMPTOMS  °· Abdominal pain and tenderness. Usually, the pain is located on the left side of your abdomen. However, it could be located elsewhere. °· Fever. °· Bloating. °· Feeling sick to your stomach (nausea). °· Throwing up (vomiting). °· Abnormal stools. °DIAGNOSIS  °Your caregiver will take a history and perform a physical exam. Since many things can cause abdominal pain, other tests may be necessary. Tests may include: °· Blood tests. °· Urine tests. °· X-ray of the abdomen. °· CT scan of the abdomen. °Sometimes, surgery is needed to determine if diverticulitis or other conditions are causing your symptoms. °TREATMENT  °Most of the time, you can be treated without surgery. Treatment includes: °· Resting the bowels by only having liquids for a few days. As you improve, you will need to eat a low-fiber diet. °· Intravenous (IV) fluids if you are losing body fluids (dehydrated). °· Antibiotic medicines that treat infections may be given. °· Pain and nausea medicine, if needed. °· Surgery if the inflamed diverticulum has burst. °HOME CARE INSTRUCTIONS  °· Try a clear liquid diet (broth, tea, or water for as long as directed by your caregiver). You may then gradually begin a low-fiber diet as tolerated.  °A low-fiber diet is a diet with less than 10 grams of fiber. Choose the foods below to reduce fiber in the diet: °· White breads, cereals, rice, and pasta. °· Cooked fruits and vegetables or soft fresh fruits and vegetables without the skin. °· Ground or  well-cooked tender beef, ham, veal, lamb, pork, or poultry. °· Eggs and seafood. °· After your diverticulitis symptoms have improved, your caregiver may put you on a high-fiber diet. A high-fiber diet includes 14 grams of fiber for every 1000 calories consumed. For a standard 2000 calorie diet, you would need 28 grams of fiber. Follow these diet guidelines to help you increase the fiber in your diet. It is important to slowly increase the amount fiber in your diet to avoid gas, constipation, and bloating. °· Choose whole-grain breads, cereals, pasta, and brown rice. °· Choose fresh fruits and vegetables with the skin on. Do not overcook vegetables because the more vegetables are cooked, the more fiber is lost. °· Choose more nuts, seeds, legumes, dried peas, beans, and lentils. °· Look for food products that have greater than 3 grams of fiber per serving on the Nutrition Facts label. °· Take all medicine as directed by your caregiver. °· If your caregiver has given you a follow-up appointment, it is very important that you go. Not going could result in lasting (chronic) or permanent injury, pain, and disability. If there is any problem keeping the appointment, call to reschedule. °SEEK MEDICAL CARE IF:  °· Your pain does not improve. °· You have a hard time advancing your diet beyond clear liquids. °· Your bowel movements do not return to normal. °SEEK IMMEDIATE MEDICAL CARE IF:  °· Your pain becomes worse. °· You have an oral temperature above 102° F (38.9° C), not controlled by medicine. °· You have repeated vomiting. °· You have bloody or black, tarry stools. °·   Symptoms that brought you to your caregiver become worse or are not getting better. MAKE SURE YOU:   Understand these instructions.  Will watch your condition.  Will get help right away if you are not doing well or get worse. Document Released: 11/11/2004 Document Revised: 04/26/2011 Document Reviewed: 03/09/2010 [facility] Patient Information  2014 [place].  Diverticulitis A diverticulum is a small pouch or sac on the colon. Diverticulosis is the presence of these diverticula on the colon. Diverticulitis is the irritation (inflammation) or infection of diverticula. CAUSES  The colon and its diverticula contain bacteria. If food particles block the tiny opening to a diverticulum, the bacteria inside can grow and cause an increase in pressure. This leads to infection and inflammation and is called diverticulitis. SYMPTOMS   Abdominal pain and tenderness. Usually, the pain is located on the left side of your abdomen. However, it could be located elsewhere.  Fever.  Bloating.  Feeling sick to your stomach (nausea).  Throwing up (vomiting).  Abnormal stools. DIAGNOSIS  Your caregiver will take a history and perform a physical exam. Since many things can cause abdominal pain, other tests may be necessary. Tests may include:  Blood tests.  Urine tests.  X-ray of the abdomen.  CT scan of the abdomen. Sometimes, surgery is needed to determine if diverticulitis or other conditions are causing your symptoms. TREATMENT  Most of the time, you can be treated without surgery. Treatment includes:  Resting the bowels by only having liquids for a few days. As you improve, you will need to eat a low-fiber diet.  Intravenous (IV) fluids if you are losing body fluids (dehydrated).  Antibiotic medicines that treat infections may be given.  Pain and nausea medicine, if needed.  Surgery if the inflamed diverticulum has burst. HOME CARE INSTRUCTIONS   Try a clear liquid diet (broth, tea, or water for as long as directed by your caregiver). You may then gradually begin a low-fiber diet as tolerated.  A low-fiber diet is a diet with less than 10 grams of fiber. Choose the foods below to reduce fiber in the diet:  White breads, cereals, rice, and pasta.  Cooked fruits and vegetables or soft fresh fruits and vegetables without  the skin.  Ground or well-cooked tender beef, ham, veal, lamb, pork, or poultry.  Eggs and seafood.  After your diverticulitis symptoms have improved, your caregiver may put you on a high-fiber diet. A high-fiber diet includes 14 grams of fiber for every 1000 calories consumed. For a standard 2000 calorie diet, you would need 28 grams of fiber. Follow these diet guidelines to help you increase the fiber in your diet. It is important to slowly increase the amount fiber in your diet to avoid gas, constipation, and bloating.  Choose whole-grain breads, cereals, pasta, and brown rice.  Choose fresh fruits and vegetables with the skin on. Do not overcook vegetables because the more vegetables are cooked, the more fiber is lost.  Choose more nuts, seeds, legumes, dried peas, beans, and lentils.  Look for food products that have greater than 3 grams of fiber per serving on the Nutrition Facts label.  Take all medicine as directed by your caregiver.  If your caregiver has given you a follow-up appointment, it is very important that you go. Not going could result in lasting (chronic) or permanent injury, pain, and disability. If there is any problem keeping the appointment, call to reschedule. SEEK MEDICAL CARE IF:   Your pain does not improve.  You  have a hard time advancing your diet beyond clear liquids.  Your bowel movements do not return to normal. SEEK IMMEDIATE MEDICAL CARE IF:   Your pain becomes worse.  You have an oral temperature above 102 F (38.9 C), not controlled by medicine.  You have repeated vomiting.  You have bloody or black, tarry stools.  Symptoms that brought you to your caregiver become worse or are not getting better. MAKE SURE YOU:   Understand these instructions.  Will watch your condition.  Will get help right away if you are not doing well or get worse. Document Released: 11/11/2004 Document Revised: 04/26/2011 Document Reviewed: 03/09/2010 [facility]  Patient Information 2014 [place].

## 2013-05-14 ENCOUNTER — Ambulatory Visit (INDEPENDENT_AMBULATORY_CARE_PROVIDER_SITE_OTHER): Payer: Medicare HMO | Admitting: Gastroenterology

## 2013-05-14 ENCOUNTER — Encounter (HOSPITAL_COMMUNITY): Payer: Self-pay | Admitting: Emergency Medicine

## 2013-05-14 ENCOUNTER — Emergency Department (HOSPITAL_COMMUNITY)
Admission: EM | Admit: 2013-05-14 | Discharge: 2013-05-14 | Disposition: A | Discharge status: Home / Self Care | Payer: Medicare HMO | Attending: Emergency Medicine | Admitting: Emergency Medicine

## 2013-05-14 ENCOUNTER — Encounter: Payer: Self-pay | Admitting: Gastroenterology

## 2013-05-14 VITALS — BP 122/68 | HR 64 | Ht 63.0 in | Wt 208.0 lb

## 2013-05-14 DIAGNOSIS — IMO0001 Reserved for inherently not codable concepts without codable children: Secondary | ICD-10-CM | POA: Insufficient documentation

## 2013-05-14 DIAGNOSIS — Z8619 Personal history of other infectious and parasitic diseases: Secondary | ICD-10-CM | POA: Insufficient documentation

## 2013-05-14 DIAGNOSIS — Z862 Personal history of diseases of the blood and blood-forming organs and certain disorders involving the immune mechanism: Secondary | ICD-10-CM | POA: Insufficient documentation

## 2013-05-14 DIAGNOSIS — F411 Generalized anxiety disorder: Secondary | ICD-10-CM | POA: Insufficient documentation

## 2013-05-14 DIAGNOSIS — R1032 Left lower quadrant pain: Secondary | ICD-10-CM

## 2013-05-14 DIAGNOSIS — F17200 Nicotine dependence, unspecified, uncomplicated: Secondary | ICD-10-CM | POA: Insufficient documentation

## 2013-05-14 DIAGNOSIS — E669 Obesity, unspecified: Secondary | ICD-10-CM | POA: Insufficient documentation

## 2013-05-14 DIAGNOSIS — K5732 Diverticulitis of large intestine without perforation or abscess without bleeding: Secondary | ICD-10-CM | POA: Insufficient documentation

## 2013-05-14 DIAGNOSIS — I252 Old myocardial infarction: Secondary | ICD-10-CM | POA: Insufficient documentation

## 2013-05-14 DIAGNOSIS — Z7982 Long term (current) use of aspirin: Secondary | ICD-10-CM | POA: Insufficient documentation

## 2013-05-14 DIAGNOSIS — K5792 Diverticulitis of intestine, part unspecified, without perforation or abscess without bleeding: Secondary | ICD-10-CM

## 2013-05-14 DIAGNOSIS — E119 Type 2 diabetes mellitus without complications: Secondary | ICD-10-CM | POA: Insufficient documentation

## 2013-05-14 DIAGNOSIS — K219 Gastro-esophageal reflux disease without esophagitis: Secondary | ICD-10-CM | POA: Insufficient documentation

## 2013-05-14 DIAGNOSIS — Z880 Allergy status to penicillin: Secondary | ICD-10-CM | POA: Insufficient documentation

## 2013-05-14 DIAGNOSIS — Z79899 Other long term (current) drug therapy: Secondary | ICD-10-CM | POA: Insufficient documentation

## 2013-05-14 DIAGNOSIS — Z860100 Personal history of colon polyps, unspecified: Secondary | ICD-10-CM | POA: Insufficient documentation

## 2013-05-14 DIAGNOSIS — E785 Hyperlipidemia, unspecified: Secondary | ICD-10-CM | POA: Insufficient documentation

## 2013-05-14 DIAGNOSIS — I10 Essential (primary) hypertension: Secondary | ICD-10-CM | POA: Insufficient documentation

## 2013-05-14 DIAGNOSIS — J449 Chronic obstructive pulmonary disease, unspecified: Secondary | ICD-10-CM | POA: Insufficient documentation

## 2013-05-14 DIAGNOSIS — Z792 Long term (current) use of antibiotics: Secondary | ICD-10-CM | POA: Insufficient documentation

## 2013-05-14 DIAGNOSIS — Z87442 Personal history of urinary calculi: Secondary | ICD-10-CM | POA: Insufficient documentation

## 2013-05-14 DIAGNOSIS — F3130 Bipolar disorder, current episode depressed, mild or moderate severity, unspecified: Secondary | ICD-10-CM | POA: Insufficient documentation

## 2013-05-14 DIAGNOSIS — J4489 Other specified chronic obstructive pulmonary disease: Secondary | ICD-10-CM | POA: Insufficient documentation

## 2013-05-14 DIAGNOSIS — G40909 Epilepsy, unspecified, not intractable, without status epilepticus: Secondary | ICD-10-CM | POA: Insufficient documentation

## 2013-05-14 DIAGNOSIS — M069 Rheumatoid arthritis, unspecified: Secondary | ICD-10-CM | POA: Insufficient documentation

## 2013-05-14 LAB — COMPREHENSIVE METABOLIC PANEL
ALK PHOS: 76 U/L (ref 39–117)
ALT: 19 U/L (ref 0–53)
AST: 23 U/L (ref 0–37)
Albumin: 3.4 g/dL — ABNORMAL LOW (ref 3.5–5.2)
BUN: 17 mg/dL (ref 6–23)
CO2: 27 mEq/L (ref 19–32)
Calcium: 9.1 mg/dL (ref 8.4–10.5)
Chloride: 102 mEq/L (ref 96–112)
Creatinine, Ser: 1.12 mg/dL (ref 0.50–1.35)
GFR calc Af Amer: 78 mL/min — ABNORMAL LOW (ref >90)
GFR calc non Af Amer: 68 mL/min — ABNORMAL LOW (ref >90)
Glucose, Bld: 87 mg/dL (ref 70–99)
POTASSIUM: 3.4 meq/L — AB (ref 3.7–5.3)
SODIUM: 142 meq/L (ref 137–147)
Total Bilirubin: 0.7 mg/dL (ref 0.3–1.2)
Total Protein: 6.8 g/dL (ref 6.0–8.3)

## 2013-05-14 LAB — CBC
HCT: 42.2 % (ref 39.0–52.0)
Hemoglobin: 14.3 g/dL (ref 13.0–17.0)
MCH: 30.8 pg (ref 26.0–34.0)
MCHC: 33.9 g/dL (ref 30.0–36.0)
MCV: 90.8 fL (ref 78.0–100.0)
PLATELETS: 252 10*3/uL (ref 150–400)
RBC: 4.65 MIL/uL (ref 4.22–5.81)
RDW: 14 % (ref 11.5–15.5)
WBC: 8.1 10*3/uL (ref 4.0–10.5)

## 2013-05-14 MED ORDER — SODIUM CHLORIDE 0.9 % IV BOLUS (SEPSIS)
1000.0000 mL | Freq: Once | INTRAVENOUS | Status: AC
  Administered 2013-05-14: 1000 mL via INTRAVENOUS

## 2013-05-14 MED ORDER — HYDROMORPHONE HCL PF 1 MG/ML IJ SOLN
1.0000 mg | Freq: Once | INTRAMUSCULAR | Status: AC
  Administered 2013-05-14: 1 mg via INTRAVENOUS
  Filled 2013-05-14: qty 1

## 2013-05-14 MED ORDER — HYDROMORPHONE HCL PF 1 MG/ML IJ SOLN
0.5000 mg | Freq: Once | INTRAMUSCULAR | Status: AC
  Administered 2013-05-14: 0.5 mg via INTRAVENOUS
  Filled 2013-05-14: qty 1

## 2013-05-14 MED ORDER — LEVOFLOXACIN IN D5W 500 MG/100ML IV SOLN
500.0000 mg | Freq: Once | INTRAVENOUS | Status: AC
  Administered 2013-05-14: 500 mg via INTRAVENOUS
  Filled 2013-05-14: qty 100

## 2013-05-14 MED ORDER — POTASSIUM CHLORIDE CRYS ER 20 MEQ PO TBCR
40.0000 meq | EXTENDED_RELEASE_TABLET | Freq: Once | ORAL | Status: AC
  Administered 2013-05-14: 40 meq via ORAL
  Filled 2013-05-14: qty 2

## 2013-05-14 MED ORDER — ONDANSETRON HCL 4 MG/2ML IJ SOLN
4.0000 mg | Freq: Once | INTRAMUSCULAR | Status: AC
  Administered 2013-05-14: 4 mg via INTRAVENOUS
  Filled 2013-05-14: qty 2

## 2013-05-14 MED ORDER — CLINDAMYCIN HCL 300 MG PO CAPS: 300.0000 mg | ORAL_CAPSULE | Freq: Three times a day (TID) | ORAL | Status: DC

## 2013-05-14 MED ORDER — CLINDAMYCIN PHOSPHATE 600 MG/50ML IV SOLN
600.0000 mg | Freq: Once | INTRAVENOUS | Status: AC
  Administered 2013-05-14: 600 mg via INTRAVENOUS
  Filled 2013-05-14: qty 50

## 2013-05-14 NOTE — Telephone Encounter (Signed)
Referral placed back to Dr. Maurene Capes.  He is an established patient.

## 2013-05-14 NOTE — Progress Notes (Signed)
05/14/2013 Patrick Holt [id number] Dec 29, 1950   History of Present Illness:  This is a 63 year old white male who is known to Dr. Olevia Perches for over 20 years, initially under the name of [name], who assumed his mother's last name after her death in 2002/06/04. He has had dermatomyositis for many years and at one point was wheelchair-bound. He is currently walking with a cane. He has been having his colorectal screening every 5 years because of increased incidence of colorectal cancer in dermatomyositis.  He had several colonoscopies and has a history of adenomatous polyps in 2005, 2009, and most recently in 09/2012.  He comes in today our office today with complaints of worsening abdominal pain.  He was in the ED on 3/27 at which time CT scan was performed and showed acute diverticulitis of the sigmoid colon.  He has several medication allergies including PCN, cipro, and flagyl.  He was given Levaquin 500 mg to begin taking once daily.  He says that he has been taking those, but the pain is worse.  Having nausea and vomiting as well.  Was given Zofran in the ED also.  Has oxycodone that he takes chronically at home.   Current Medications, Allergies, Past Medical History, Past Surgical History, Family History and Social History were reviewed in [employer] record.   Physical Exam: BP 122/68  Pulse 64  Ht 5\' 3"  (1.6 m)  Wt 208 lb (94.348 kg)  BMI 36.85 kg/m2 General: Well developed male in no acute distress; tearful Head: Normocephalic and atraumatic Eyes:  Sclerae anicteric, conjunctiva pink  Ears: Normal auditory acuity Lungs: Clear throughout to auscultation Heart: Regular rate and rhythm Abdomen: Soft, non-distended.  Bowel sounds present.  Patient express significant diffuse TTP > in the LLQ. Musculoskeletal: Symmetrical with no gross deformities  Extremities: No edema  Neurological: Alert oriented x 4, grossly non-focal Psychological:  Alert and cooperative.  Normal mood and affect  Assessment and Recommendations: -Diverticulitis, uncomplicated as seen on recent CT scan with worsening pain:  I discussed with the patient the need for repeat CT scan with complaints of worsening pain.  I said that we can order this STAT as an outpatient today vs have him return to the ED for re-evaluation/re-imaging.  He said that due to financial and transportation issues that he cannot get to an appt later today and would rather go to [facility] ED.  Need to rule out complications such as abscess formation, etc.  Patient has several medications listed as allergies and was only able to be given Levaquin 500 mg once daily for his treatment.

## 2013-05-14 NOTE — Progress Notes (Signed)
Reviewed,  I agree with the assessment but there has been an element of dependence on pain meds and magnification of symptoms in the past, so it  Is appropriate to get objective data.

## 2013-05-14 NOTE — ED Notes (Signed)
Pt c/o abd pain; treated at [facility] for same and told had diverticulitis; continues with abd pain; told to return to the ER for repeat CT scan

## 2013-05-14 NOTE — Discharge Instructions (Signed)
Rest. Drink plenty of fluids. Complete the course of your current antibiotic (levaquin). Also take clindamycin (antibiotic) as prescribed. You may take your percocet as need for pain. No driving for the next 6 hours or when taking percocet. Also, do not take tylenol or acetaminophen containing medication when taking percocet.  Take your zofran as need for nausea. Follow up with your primary care doctor in the next couple days for recheck.  Return to ER if worse, new symptoms, fevers, persistent vomiting, other concern.      Diverticulitis A diverticulum is a small pouch or sac on the colon. Diverticulosis is the presence of these diverticula on the colon. Diverticulitis is the irritation (inflammation) or infection of diverticula. CAUSES  The colon and its diverticula contain bacteria. If food particles block the tiny opening to a diverticulum, the bacteria inside can grow and cause an increase in pressure. This leads to infection and inflammation and is called diverticulitis. SYMPTOMS   Abdominal pain and tenderness. Usually, the pain is located on the left side of your abdomen. However, it could be located elsewhere.  Fever.  Bloating.  Feeling sick to your stomach (nausea).  Throwing up (vomiting).  Abnormal stools. DIAGNOSIS  Your caregiver will take a history and perform a physical exam. Since many things can cause abdominal pain, other tests may be necessary. Tests may include:  Blood tests.  Urine tests.  X-ray of the abdomen.  CT scan of the abdomen. Sometimes, surgery is needed to determine if diverticulitis or other conditions are causing your symptoms. TREATMENT  Most of the time, you can be treated without surgery. Treatment includes:  Resting the bowels by only having liquids for a few days. As you improve, you will need to eat a low-fiber diet.  Intravenous (IV) fluids if you are losing body fluids (dehydrated).  Antibiotic medicines that treat infections  may be given.  Pain and nausea medicine, if needed.  Surgery if the inflamed diverticulum has burst. HOME CARE INSTRUCTIONS   Try a clear liquid diet (broth, tea, or water for as long as directed by your caregiver). You may then gradually begin a low-fiber diet as tolerated.  A low-fiber diet is a diet with less than 10 grams of fiber. Choose the foods below to reduce fiber in the diet:  White breads, cereals, rice, and pasta.  Cooked fruits and vegetables or soft fresh fruits and vegetables without the skin.  Ground or well-cooked tender beef, ham, veal, lamb, pork, or poultry.  Eggs and seafood.  After your diverticulitis symptoms have improved, your caregiver may put you on a high-fiber diet. A high-fiber diet includes 14 grams of fiber for every 1000 calories consumed. For a standard 2000 calorie diet, you would need 28 grams of fiber. Follow these diet guidelines to help you increase the fiber in your diet. It is important to slowly increase the amount fiber in your diet to avoid gas, constipation, and bloating.  Choose whole-grain breads, cereals, pasta, and brown rice.  Choose fresh fruits and vegetables with the skin on. Do not overcook vegetables because the more vegetables are cooked, the more fiber is lost.  Choose more nuts, seeds, legumes, dried peas, beans, and lentils.  Look for food products that have greater than 3 grams of fiber per serving on the Nutrition Facts label.  Take all medicine as directed by your caregiver.  If your caregiver has given you a follow-up appointment, it is very important that you go. Not going could result in  lasting (chronic) or permanent injury, pain, and disability. If there is any problem keeping the appointment, call to reschedule. SEEK MEDICAL CARE IF:   Your pain does not improve.  You have a hard time advancing your diet beyond clear liquids.  Your bowel movements do not return to normal. SEEK IMMEDIATE MEDICAL CARE IF:    Your pain becomes worse.  You have an oral temperature above 102 F (38.9 C), not controlled by medicine.  You have repeated vomiting.  You have bloody or black, tarry stools.  Symptoms that brought you to your caregiver become worse or are not getting better. MAKE SURE YOU:   Understand these instructions.  Will watch your condition.  Will get help right away if you are not doing well or get worse. Document Released: 11/11/2004 Document Revised: 04/26/2011 Document Reviewed: 03/09/2010 [facility] Patient Information 2014 [place].

## 2013-05-14 NOTE — Patient Instructions (Signed)
- 

## 2013-05-14 NOTE — ED Provider Notes (Signed)
CSN: [id number]     Arrival date & time 05/14/13  1056 History   First MD Initiated Contact with Patient 05/14/13 1138     Chief Complaint  Patient presents with  . Abdominal Pain     (Consider location/radiation/quality/duration/timing/severity/associated sxs/prior Treatment) Patient is a 63 y.o. male presenting with abdominal pain.  Abdominal Pain Associated symptoms: no chest pain, no chills, no cough, no dysuria, no fever, no shortness of breath, no sore throat and no vomiting   pt c/o persistent left lower abdominal pain since being diagnosed with diverticulitis a couple days ago. States was put on levaquin, which he has had 2 doses.  Today he went to follow up as his gi doctors office, was examined by their PA/NP and was told to return to ER.  Pt denies fever or chills. No nv. Having regular bms. No faintness or dizziness. States compliant w rx. Denies any abrupt worsening in his pain or new symptoms since diagnoses, but states pain persists. Pain is constant, dull, moderate-sev, non radiating, worse w palpation.        Past Medical History  Diagnosis Date  . Rheumatoid arthritis(714.0)   . Obesity   . Major depression     with acute psychotic break in 06/2010  . Hypertension   . Hyperlipidemia   . Diverticulosis of colon   . COPD (chronic obstructive pulmonary disease)   . Anxiety   . GERD (gastroesophageal reflux disease)   . Vertigo   . Fibromyalgia   . Dermatomyositis   . Myocardial infarct     mulitple (1999, 2000, 2004)  . Raynaud's disease   . Narcotic dependence   . Peripheral neuropathy   . Internal hemorrhoids   . Ischemic heart disease   . Hiatal hernia   . Gastritis   . Diverticulitis   . Hx of adenomatous colonic polyps   . Nephrolithiasis   . Anemia   . Esophageal stricture   . Esophageal dysmotility   . Dermatomyositis   . Urge incontinence   . Otosclerosis   . Bipolar 1 disorder   . OCD (obsessive compulsive disorder)   . Sarcoidosis   .  Paroxysmal a-fib   . Dysrhythmia     "irregular" (11/15/2012)  . Type II diabetes mellitus   . Seizures   . Headache(784.0)     "severe; get them often" (11/15/2012)   Past Surgical History  Procedure Laterality Date  . Knee arthroscopy w/ meniscal repair Left 2009  . Lumbar disc surgery    . Squamous papilloma   2010    removed by Dr. Constance Holster ENT, noted on tongue  . Esophagogastroduodenoscopy N/A 09/27/2012    Procedure: ESOPHAGOGASTRODUODENOSCOPY (EGD);  Surgeon: Lafayette Dragon, MD;  Location: [facility];  Service: Endoscopy;  Laterality: N/A;  . Colonoscopy N/A 09/27/2012    Procedure: COLONOSCOPY;  Surgeon: Lafayette Dragon, MD;  Location: [facility];  Service: Endoscopy;  Laterality: N/A;  . Cataract extraction w/ intraocular lens implant Left   . Lymph node dissection Right 1970's    "neck; dr thought I had Hodgkins; turned out to be sarcoidosis" (11/15/2012)  . Tonsillectomy    . Carpal tunnel release Bilateral   . Cardiac catheterization     Family History  Problem Relation Age of Onset  . Alcohol abuse Mother   . Depression Mother   . Heart disease Mother   . Diabetes Mother   . Stroke Mother   . Diabetes Other     1/2 brother  .  Hepatitis Brother    History  Substance Use Topics  . Smoking status: Current Every Day Smoker -- 0.50 packs/day for 30 years    Types: Cigarettes  . Smokeless tobacco: Never Used  . Alcohol Use: No     Comment: Alcohol stopped in September of 2014    Review of Systems  Constitutional: Negative for fever and chills.  HENT: Negative for sore throat.   Eyes: Negative for redness.  Respiratory: Negative for cough and shortness of breath.   Cardiovascular: Negative for chest pain.  Gastrointestinal: Positive for abdominal pain. Negative for vomiting.  Genitourinary: Negative for dysuria and flank pain.  Musculoskeletal: Negative for back pain and neck pain.  Skin: Negative for rash.  Neurological: Negative for weakness, light-headedness  and headaches.  Hematological: Does not bruise/bleed easily.  Psychiatric/Behavioral: Negative for confusion.      Allergies  Betamethasone dipropionate; Bupropion hcl; Ciprofloxacin; Clobetasol; Codeine; Escitalopram oxalate; Flagyl; Fluoxetine hcl; Fluticasone-salmeterol; Furosemide; Immune globulins; Paroxetine; Penicillins; Sulfa antibiotics; Tacrolimus; Tetanus toxoid; and Tuberculin purified protein derivative  Home Medications   Current Outpatient Rx  Name  Route  Sig  Dispense  Refill  . albuterol-ipratropium (COMBIVENT) 18-103 MCG/ACT inhaler   Inhalation   Inhale 1 puff into the lungs every 6 (six) hours as needed for wheezing or shortness of breath.         . ALPRAZolam (XANAX) 1 MG tablet   Oral   Take 1 tablet (1 mg total) by mouth at bedtime as needed for anxiety.   90 tablet   1   . amiodarone (PACERONE) 200 MG tablet   Oral   Take 1 tablet (200 mg total) by mouth daily before breakfast.   90 tablet   1   . aspirin EC 81 MG EC tablet   Oral   Take 1 tablet (81 mg total) by mouth daily.         Marland Kitchen atorvastatin (LIPITOR) 40 MG tablet   Oral   Take 1 tablet (40 mg total) by mouth daily.   90 tablet   1   . divalproex (DEPAKOTE ER) 500 MG 24 hr tablet   Oral   Take 1 tablet (500 mg total) by mouth 2 (two) times daily.   180 tablet   1   . esomeprazole (NEXIUM) 40 MG capsule   Oral   Take 1 capsule (40 mg total) by mouth daily before breakfast.   90 capsule   1   . levETIRAcetam (KEPPRA) 500 MG tablet   Oral   Take 1 tablet (500 mg total) by mouth 2 (two) times daily.   180 tablet   1   . levofloxacin (LEVAQUIN) 500 MG tablet   Oral   Take 1 tablet (500 mg total) by mouth daily.   10 tablet   0   . Multiple Vitamin (MULTIVITAMIN WITH MINERALS) TABS tablet   Oral   Take 1 tablet by mouth daily.   90 tablet   3   . nitroGLYCERIN (NITROSTAT) 0.4 MG SL tablet   Sublingual   Place 0.4 mg under the tongue every 5 (five) minutes as needed.  For chest pain         . ondansetron (ZOFRAN) 4 MG tablet   Oral   Take 2 tablets (8 mg total) by mouth every 6 (six) hours.   12 tablet   0   . oxybutynin (DITROPAN) 5 MG tablet   Oral   Take 1 tablet (5 mg total) by mouth daily.  90 tablet   1   . oxyCODONE-acetaminophen (PERCOCET/ROXICET) 5-325 MG per tablet   Oral   Take 1 tablet by mouth every 4 (four) hours as needed for severe pain.   20 tablet   0   . potassium chloride SA (K-DUR,KLOR-CON) 20 MEQ tablet   Oral   Take 1 tablet (20 mEq total) by mouth daily.   90 tablet   1   . risperiDONE (RISPERDAL) 1 MG tablet   Oral   Take 1 tablet (1 mg total) by mouth at bedtime.   90 tablet   1   . torsemide (DEMADEX) 20 MG tablet   Oral   Take 1 tablet (20 mg total) by mouth daily.   90 tablet   1    BP 123/79  Pulse 67  Temp(Src) 98.3 F (36.8 C) (Oral)  Resp 20  SpO2 95% Physical Exam  Nursing note and vitals reviewed. Constitutional: He is oriented to person, place, and time. He appears well-developed and well-nourished. No distress.  HENT:  Mouth/Throat: Oropharynx is clear and moist.  Eyes: Conjunctivae are normal. No scleral icterus.  Neck: Neck supple. No tracheal deviation present.  Cardiovascular: Normal rate, regular rhythm, normal heart sounds and intact distal pulses.   Pulmonary/Chest: Effort normal and breath sounds normal. No accessory muscle usage. No respiratory distress.  Abdominal: Soft. Bowel sounds are normal. He exhibits no distension and no mass. There is tenderness. There is no rebound and no guarding.  LLQ tenderness, no rebound or guarding.   Genitourinary:  No cva tenderness  Musculoskeletal: Normal range of motion.  Neurological: He is alert and oriented to person, place, and time.  Skin: Skin is warm and dry. He is not diaphoretic.  Psychiatric: He has a normal mood and affect.    ED Course  Procedures (including critical care time)   Results for orders placed during the  hospital encounter of 05/14/13  CBC      Result Value Ref Range   WBC 8.1  4.0 - 10.5 K/uL   RBC 4.65  4.22 - 5.81 MIL/uL   Hemoglobin 14.3  13.0 - 17.0 g/dL   HCT 42.2  39.0 - 52.0 %   MCV 90.8  78.0 - 100.0 fL   MCH 30.8  26.0 - 34.0 pg   MCHC 33.9  30.0 - 36.0 g/dL   RDW 14.0  11.5 - 15.5 %   Platelets 252  150 - 400 K/uL  COMPREHENSIVE METABOLIC PANEL      Result Value Ref Range   Sodium 142  137 - 147 mEq/L   Potassium 3.4 (*) 3.7 - 5.3 mEq/L   Chloride 102  96 - 112 mEq/L   CO2 27  19 - 32 mEq/L   Glucose, Bld 87  70 - 99 mg/dL   BUN 17  6 - 23 mg/dL   Creatinine, Ser 1.12  0.50 - 1.35 mg/dL   Calcium 9.1  8.4 - 10.5 mg/dL   Total Protein 6.8  6.0 - 8.3 g/dL   Albumin 3.4 (*) 3.5 - 5.2 g/dL   AST 23  0 - 37 U/L   ALT 19  0 - 53 U/L   Alkaline Phosphatase 76  39 - 117 U/L   Total Bilirubin 0.7  0.3 - 1.2 mg/dL   GFR calc non Af Amer 68 (*) >90 mL/min   GFR calc Af Amer 78 (*) >90 mL/min   Dg Chest 2 View  05/11/2013   CLINICAL DATA:  Left-sided  chest pain.  EXAM: CHEST  2 VIEW  COMPARISON:  Chest x-ray 12/30/2012.  FINDINGS: Linear opacities in lung bases bilaterally most compatible with areas of subsegmental atelectasis or scarring. No consolidative airspace disease. No pleural effusions. No evidence of pulmonary edema. Heart size is normal. Mediastinal contours are unremarkable.  IMPRESSION: 1. No radiographic evidence of acute cardiopulmonary disease. 2. Minimal bibasilar subsegmental atelectasis or scarring.   Electronically Signed   By: Vinnie Langton M.D.   On: 05/11/2013 02:44   Ct Abdomen Pelvis W Contrast  05/11/2013   CLINICAL DATA:  Left-sided chest pain.  Lower abdominal pain.  EXAM: CT ABDOMEN AND PELVIS WITH CONTRAST  TECHNIQUE: Multidetector CT imaging of the abdomen and pelvis was performed using the standard protocol following bolus administration of intravenous contrast.  CONTRAST:  30mL OMNIPAQUE IOHEXOL 300 MG/ML  SOLN  COMPARISON:  CT of the abdomen and  pelvis 07/15/2009.  FINDINGS: Lung Bases: Dependent areas of scarring and/or atelectasis in the lower lobes of the lungs bilaterally. Atherosclerotic calcifications in the left anterior descending and right coronary arteries.  Abdomen/Pelvis: The appearance of the liver, gallbladder, pancreas, spleen and bilateral adrenal glands is unremarkable. Several low-attenuation lesions in the kidneys bilaterally are noted, the majority of which are too small to definitively characterize. The larger of the renal lesions are all compatible with simple cysts, with the largest of these measuring 5.7 cm in diameter in the upper pole the right kidney. Atherosclerosis throughout the abdominal and pelvic vasculature, without evidence of aneurysm or dissection.  Colonic diverticulosis. Adjacent to several colonic diverticulae in the region of the sigmoid colon there is extensive soft tissue stranding and hypervascularity, compatible with acute diverticulitis. No well-defined fluid collection is identified at this time to suggest presence of diverticular abscess. No pneumoperitoneum. No significant volume of ascites. No pathologic distention of small bowel. Normal appendix. Prostate gland and urinary bladder are unremarkable in appearance.  Musculoskeletal: There are no aggressive appearing lytic or blastic lesions noted in the visualized portions of the skeleton.  IMPRESSION: 1. Acute diverticulitis of the sigmoid colon. No diverticular abscess or signs to suggest frank perforation are noted at this time. 2. Normal appendix. 3. Extensive atherosclerosis, including at least 2 vessel coronary artery disease. Please note that although the presence of coronary artery calcium documents the presence of coronary artery disease, the severity of this disease and any potential stenosis cannot be assessed on this non-gated CT examination. Assessment for potential risk factor modification, dietary therapy or pharmacologic therapy may be  warranted, if clinically indicated. 4. Additional incidental findings, as above. These results were called by telephone at the time of interpretation on 05/11/2013 at 3:39 AM to Dr. Jola Schmidt, who verbally acknowledged these results.   Electronically Signed   By: Vinnie Langton M.D.   On: 05/11/2013 03:40      MDM  Iv ns bolus.  Reviewed nursing notes and prior charts for additional history.   Reviewed prior chart, dx diverticulitis, no abscess or perf.  Pt started levaquin x 2 doses, didn't yet take todays.   Not on cipro and flagyl - pt states in 2002 he had diverticulitis, was on cipro and flagyl - states then had heart attack, and so was told he couldn't take cipro or flagyl.   I discussed w pt that cipro and flagyl would not have caused his heart attack, nevertheless he states that's what his doctor told him, so he will not take.   Will broaden coverage w clindamycin.  Pt afeb, vitals normal, LLQ tenderness but no peritoneal findings - no indication to repeat ct.   Pt tolerating po fluids.  Hr 60's, afeb, abd w LLQ tenderness without peritonitis, known diverticulitis on recent ct - pt appears stable for d/c.     Mirna Mires, MD 05/14/13 [phone]

## 2013-05-21 ENCOUNTER — Encounter: Payer: Self-pay | Admitting: Clinical

## 2013-05-21 ENCOUNTER — Ambulatory Visit (INDEPENDENT_AMBULATORY_CARE_PROVIDER_SITE_OTHER): Payer: Commercial Managed Care - HMO | Admitting: Sports Medicine

## 2013-05-21 ENCOUNTER — Encounter: Payer: Self-pay | Admitting: Sports Medicine

## 2013-05-21 VITALS — BP 114/81 | HR 74 | Temp 99.4°F | Ht 63.0 in | Wt 205.0 lb

## 2013-05-21 DIAGNOSIS — I4891 Unspecified atrial fibrillation: Secondary | ICD-10-CM

## 2013-05-21 DIAGNOSIS — K5732 Diverticulitis of large intestine without perforation or abscess without bleeding: Secondary | ICD-10-CM

## 2013-05-21 DIAGNOSIS — Z7289 Other problems related to lifestyle: Secondary | ICD-10-CM

## 2013-05-21 DIAGNOSIS — Z609 Problem related to social environment, unspecified: Secondary | ICD-10-CM

## 2013-05-21 DIAGNOSIS — I10 Essential (primary) hypertension: Secondary | ICD-10-CM

## 2013-05-21 MED ORDER — ALPRAZOLAM 1 MG PO TABS: 1.0000 mg | ORAL_TABLET | Freq: Every day | ORAL | Status: DC

## 2013-05-21 MED ORDER — CLINDAMYCIN HCL 300 MG PO CAPS: 300.0000 mg | ORAL_CAPSULE | Freq: Three times a day (TID) | ORAL | Status: DC

## 2013-05-21 NOTE — Progress Notes (Signed)
Clinical Education officer, museum (CSW) met with pt after pt visit with PCP. CSW explored needs. Pt states he is trying to move to a new apartment on 4/16 however will need assistance moving his 1bdroom to his new apartment. CSW inquired whether pt can hire a [place] however pt stated he could not and unfortunately did not have any reliable friends to assist him. CSW informed pt that unfortunately there are no resources/agencies (that CSW is aware of) that would assist pt in moving at no cost. CSW also explored whether pt has any food at home/how he obtains his meals. Pt states he receives meals on wheels however has told them to stop bringing meals until he moves into his new location. Pt also stated he normally gets dropped off at the grocery store by [employer] and then pt will have a taxi bring him home. CSW asked pt whether he will have dinner today however pt stated he has already eaten - CSW offered to find some snacks for pt in clinic however pt declined. CSW informed pt that CSW would do some research and contact pt back, pt appreciative.  CSW contacted Loni Muse, RN with [facility]. Pam confirmed that she remains working with pt and that when she has visited pt he has had food in his home. Pam also suggested pt reinstate meals on wheels to obtain food before he moves. CSW also informed that there are not any services to move someone at no cost. Pam however will contact the [facility] CSW to provide assistance and if needed provide pt with a food voucher or take food to his home.   Hunt Oris, MSW, [place]

## 2013-05-21 NOTE — Patient Instructions (Signed)
It was good to see you today. Please pick up her prescription for clindamycin that I have called in. I will send in refills as needed. Please do not start any new blood pressure medications but I would like for you to followup with Dr. Harrington Challenger and when you are able to. Constance Holster our Education officer, museum is meeting with you now and will hopefully they'll help you with food, housing and moving issues

## 2013-05-21 NOTE — Progress Notes (Signed)
Patrick Holt - 63 y.o. male MRN [id number]  Date of birth: [birth year]  SUBJECTIVE:     CC: Medical Managment of Chronic Issues See problem based charting for additional subjective (including HPI, Interval History & ROS)   HISTORY: Quin has a complex medical history.  A generalized overview of his active medical problems include: # NEURO PSYCH: Multiple prior admissions for psychotic behaviors but has never ultimately been hospitalized at a psychiatric facility due to lack of placement and patient will regain capacity and requests discharge.  He is unwilling to be followed by psychiatry and has been fairly well controlled on Depakote, Xanax and Keppra. - There have been concerns for some dementia with alcoholism although his long-term recall is impressively accurate at times and he denies alcohol use - Unclear history of seizure disorder but seems to be in the setting of Xanax withdrawal without recent seizure activity - He easily perseverates on aspects of his medical care including allergies to medications that he is unable to recall and has quite ponderous reports from prior physicians about specific medications causing medical condition such as heart attacks. - He also reports being told previously that if he were to quit smoking he would die do to worsening lung disease and is adamantly against quitting although it is providers continue to encourage him to do so - Poor social situation contributes to difficulty managing his chronic diseases.  He is being evicted from his home but he is living for the past 12 years in April of 2015.  # GI: Followed by Dr. Maurene Capes.  Multiple issues with prior polyps, diverticulosis with recurrent diverticulitis, esophageal dysmotility, strictures and reflux, IBS,  # CV: Hx of HTN, HLD, Ischemic Heart Disease, A Fib on amiodarone.  Followed by Dr. Harrington Challenger.   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   # MSK & Immune: Hx of Dermatomyositis and chronic pain syndrome with prior significant mobility issues.  Has been dismissed by all rheumatology providers in [place] due to chronic no-shows.  Previously on methotrexate and prednisone however has been off of these medicines since early 2014 and is maintaining and improve functional status since that time. - Previously on chronic pain medications, methotrexate, prednisone, but do not recommend restarting.  Other Pertinent Med/Surg/Hosp History: # Urge incontinence   Follow up Issues:  > Social situation including food security and medication availability.  Recent Labs  05/26/12 1433 06/26/12 1418 10/18/12 0400 11/09/12 1035 11/20/12 1518 12/29/12 0425 03/26/13 1027  HGBA1C 4.8  --   --   --  5.3  --  5.1  TRIG  --   --   --   --   --  91  --   CHOL  --   --   --   --   --  157  --   HDL  --   --   --   --   --  51  --   LDLCALC  --   --   --   --   --  88  --   TSH  --  0.41 0.752 0.708  --   --   --   } Wt Readings from Last 3 Encounters:  05/21/13 205 lb (92.987 kg)  05/14/13 208 lb (94.348 kg)  03/26/13 205 lb (92.987 kg)   BP Readings from Last 3 Encounters:  05/21/13 114/81  05/14/13 166/89  05/14/13 122/68    History  Smoking status  . Current Every Day Smoker -- 0.50 packs/day for 30 years  . Types: Cigarettes  Smokeless tobacco  . Never Used   Health Maintenance Due  Topic  . Zostavax     Otherwise past Medical, Surgical, Social, and Family History Reviewed per EMR Medications and Allergies reviewed and updated per below.  VITALS: BP 114/81  Pulse 74  Temp(Src) 99.4 F (37.4 C) (Oral)  Ht 5\' 3"  (1.6 m)  Wt 205 lb (92.987 kg)  BMI 36.32 kg/m2  PHYSICAL EXAM: GENERAL:  adult, Caucasian, younger than appearance male. In no discomfort; no respiratory distress  PSYCH: alert and appropriate, poor insight with remarkably accurate recall although  perseverates on stories from the past   HNEENT:  no JVD   CARDIO: RRR, S1/S2 heard, no murmur  LUNGS: CTA B, no wheezes, no crackles  ABDOMEN: +BS, soft, mild left lower quadrant tenderness.  No rigidity, no guarding   EXTREM:  Warm, well perfused.  Moves all 4 extremities spontaneously; no lateralization.   Pedal pulses 1/4.  Trace pretibial edema.  GU:   SKIN:     MEDICATIONS, LABS & OTHER ORDERS: Previous Medications   ALBUTEROL-IPRATROPIUM (COMBIVENT) 18-103 MCG/ACT INHALER    Inhale 1 puff into the lungs every 6 (six) hours as needed for wheezing or shortness of breath.   AMIODARONE (PACERONE) 200 MG TABLET    Take 1 tablet (200 mg total) by mouth daily before breakfast.   ASPIRIN EC 81 MG EC TABLET    Take 1 tablet (81 mg total) by mouth daily.   ATORVASTATIN (LIPITOR) 40 MG TABLET    Take 1 tablet (40 mg total) by mouth daily.   DIVALPROEX (DEPAKOTE ER) 500 MG 24 HR TABLET    Take 1 tablet (500 mg total) by mouth 2 (two) times daily.   ESOMEPRAZOLE (NEXIUM) 40 MG CAPSULE    Take 1 capsule (40 mg total) by mouth daily before breakfast.   LEVETIRACETAM (KEPPRA) 500 MG TABLET    Take 1 tablet (500 mg total) by mouth 2 (two) times daily.   LEVOFLOXACIN (LEVAQUIN) 500 MG TABLET    Take 1 tablet (500 mg total) by mouth daily.   MULTIPLE VITAMIN (MULTIVITAMIN WITH MINERALS) TABS TABLET    Take 1 tablet by mouth daily.   OXYBUTYNIN (DITROPAN) 5 MG TABLET    Take 5 mg by mouth 3 (three) times daily.   OXYCODONE-ACETAMINOPHEN (PERCOCET/ROXICET) 5-325 MG PER TABLET    Take 1 tablet by mouth every 4 (four) hours as needed for severe pain.   POTASSIUM CHLORIDE SA (K-DUR,KLOR-CON) 20 MEQ TABLET    Take 1 tablet (20 mEq total) by mouth daily.   ROPINIROLE (REQUIP) 2 MG TABLET    Take 2 mg by mouth 3 (three) times daily.   TORSEMIDE (DEMADEX) 20 MG TABLET    Take 1 tablet (20 mg total) by mouth daily.   Modified Medications   Modified Medication Previous Medication   ALPRAZOLAM (XANAX) 1 MG  TABLET ALPRAZolam (XANAX) 1 MG tablet      Take 1 tablet (1 mg total) by mouth daily.    Take 1 mg by mouth daily.   CLINDAMYCIN (CLEOCIN) 300 MG CAPSULE clindamycin (CLEOCIN) 300 MG capsule      Take 1 capsule (300 mg total) by mouth 3 (three) times daily.    Take 1 capsule (300 mg total) by mouth 3 (three) times daily.   New Prescriptions   No  medications on file   Discontinued Medications   No medications on file  No orders of the defined types were placed in this encounter.   ASSESSMENT & PLAN: See problem based charting & AVS for pt instructions.

## 2013-05-22 NOTE — Assessment & Plan Note (Addendum)
Problem Based Documentation:    Subjective Report:  Pt denies chest pain, dyspnea at rest or exertion, PND, lower extremity edema.  Patient denies any facial asymmetry, unilateral weakness, or dysarthria.  During recent pharmacy visit they noted his blood pressure was mildly elevated and question if he should be started on new medicines     Assessment & Plan & Follow up Issues:  Chronic, labile condition - I Do not think that any additional antihypertensives are appropriate for him at this time given his significant history of falls and history of orthostasis.  Although ideally I would like for him to be on an ACE inhibitor he has previously proven to be sensitive and I believe we precipitated increased falls previously (although diuretics also her have a high risk of this).  - He has previously been off Demadex but had issues with volume overload and has since been restarted.  I would prefer him to be off of this medication and on either an ACE inhibitor (diastolic dysfunction) but will defer this decision to Dr. Harrington Challenger at his next followup appointment given how stable he has been recently 1. Defer any further antihypertensives given markedly low blood pressure and marked previous fall. 2. Appropriately on Lipitor and appears volume stable on Demadex. > Consider DC Demadex and add lisinopril 2.5 mg for improved cardioprotective effects

## 2013-05-22 NOTE — Assessment & Plan Note (Signed)
Problem Based Documentation:    Subjective Report:  Overall denies any significant issues with tachycardia palpitations, increased dyspnea on exertion, orthopnea, PND or significant orthostasis.  Once again brings up the issue of prior stroke.     Assessment & Plan & Follow up Issues:  Chronic, rate controlled condition - Most recent chest x-ray in March of 2015 without evidence of pulmonary fibrosis. - After searching through the chart I do see the patient had a tiny bilateral frontal subarachnoid or subdural hemorrhage without significant trauma following a fall on 02/04/2012.  I agree he is a poor anticoagulation candidate and agree with not anticoagulating him. 1. No changes to cardiac regimen. 2. Recommend followup with Dr. Harrington Challenger as previously scheduled

## 2013-05-22 NOTE — Assessment & Plan Note (Signed)
Please see associated clinical social work note.  Patient is being evicted from his home.  He does not have help with moving.  Food Security continues to be an issue.

## 2013-05-22 NOTE — Assessment & Plan Note (Signed)
Problem Based Documentation:    Subjective Report:  Patient recently seen twice in the emergency department and once at gastroenterology for left lower quadrant pain.  He reports this is significantly improved since starting Levaquin.  Upon his subsequent return to emergency department he did not undergo a repeat CT scan do to a markedly improved exam once in the emergency department.    He was brought in to clindamycin plus Levaquin but has been unable to pick up his medication.  He is 2 days left of his Levaquin.  Reporting normal nonbloody stools  No recurrent fevers, chills, nausea or vomiting.     Assessment & Plan & Follow up Issues:  Acute, recurrent condition 1. Exam markedly improved today.  Given significant issues in the past and slow resolution I will fax an a clindamycin prescription for him that his pharmacy is able to deliver. 2. Continue with pain medication divided in emergency department.

## 2013-06-01 ENCOUNTER — Encounter: Payer: Self-pay | Admitting: Clinical

## 2013-06-01 NOTE — Progress Notes (Signed)
Clinical Social Worker (CSW) met with [facility] RNCM Loni Muse 4/16 who informed CSW that pt friend will be renting a moving truck to assist pt in moving to his new apartment. Pam remains providing [facility] services to pt.  Hunt Oris, MSW, [place]

## 2013-06-03 ENCOUNTER — Encounter (HOSPITAL_COMMUNITY): Payer: Self-pay | Admitting: Emergency Medicine

## 2013-06-03 ENCOUNTER — Emergency Department (HOSPITAL_COMMUNITY): Payer: Medicare HMO

## 2013-06-03 ENCOUNTER — Observation Stay (HOSPITAL_COMMUNITY)
Admission: EM | Admit: 2013-06-03 | Discharge: 2013-06-04 | Disposition: A | Discharge status: Home / Self Care | Payer: Medicare HMO | Attending: Family Medicine | Admitting: Family Medicine

## 2013-06-03 DIAGNOSIS — E669 Obesity, unspecified: Secondary | ICD-10-CM | POA: Insufficient documentation

## 2013-06-03 DIAGNOSIS — F1020 Alcohol dependence, uncomplicated: Secondary | ICD-10-CM | POA: Insufficient documentation

## 2013-06-03 DIAGNOSIS — M3313 Other dermatomyositis without myopathy: Secondary | ICD-10-CM | POA: Insufficient documentation

## 2013-06-03 DIAGNOSIS — G8929 Other chronic pain: Secondary | ICD-10-CM | POA: Insufficient documentation

## 2013-06-03 DIAGNOSIS — I252 Old myocardial infarction: Secondary | ICD-10-CM | POA: Insufficient documentation

## 2013-06-03 DIAGNOSIS — K449 Diaphragmatic hernia without obstruction or gangrene: Secondary | ICD-10-CM | POA: Insufficient documentation

## 2013-06-03 DIAGNOSIS — K5730 Diverticulosis of large intestine without perforation or abscess without bleeding: Secondary | ICD-10-CM | POA: Insufficient documentation

## 2013-06-03 DIAGNOSIS — K219 Gastro-esophageal reflux disease without esophagitis: Secondary | ICD-10-CM | POA: Insufficient documentation

## 2013-06-03 DIAGNOSIS — J4489 Other specified chronic obstructive pulmonary disease: Secondary | ICD-10-CM | POA: Insufficient documentation

## 2013-06-03 DIAGNOSIS — K921 Melena: Secondary | ICD-10-CM | POA: Diagnosis present

## 2013-06-03 DIAGNOSIS — K625 Hemorrhage of anus and rectum: Principal | ICD-10-CM | POA: Insufficient documentation

## 2013-06-03 DIAGNOSIS — F411 Generalized anxiety disorder: Secondary | ICD-10-CM | POA: Insufficient documentation

## 2013-06-03 DIAGNOSIS — K5790 Diverticulosis of intestine, part unspecified, without perforation or abscess without bleeding: Secondary | ICD-10-CM | POA: Diagnosis present

## 2013-06-03 DIAGNOSIS — F319 Bipolar disorder, unspecified: Secondary | ICD-10-CM | POA: Insufficient documentation

## 2013-06-03 DIAGNOSIS — M069 Rheumatoid arthritis, unspecified: Secondary | ICD-10-CM | POA: Insufficient documentation

## 2013-06-03 DIAGNOSIS — M3390 Dermatopolymyositis, unspecified, organ involvement unspecified: Secondary | ICD-10-CM | POA: Insufficient documentation

## 2013-06-03 DIAGNOSIS — I4891 Unspecified atrial fibrillation: Secondary | ICD-10-CM

## 2013-06-03 DIAGNOSIS — Z860100 Personal history of colon polyps, unspecified: Secondary | ICD-10-CM | POA: Insufficient documentation

## 2013-06-03 DIAGNOSIS — K224 Dyskinesia of esophagus: Secondary | ICD-10-CM | POA: Insufficient documentation

## 2013-06-03 DIAGNOSIS — F1027 Alcohol dependence with alcohol-induced persisting dementia: Secondary | ICD-10-CM

## 2013-06-03 DIAGNOSIS — F17200 Nicotine dependence, unspecified, uncomplicated: Secondary | ICD-10-CM | POA: Insufficient documentation

## 2013-06-03 DIAGNOSIS — Z609 Problem related to social environment, unspecified: Secondary | ICD-10-CM

## 2013-06-03 DIAGNOSIS — K589 Irritable bowel syndrome without diarrhea: Secondary | ICD-10-CM | POA: Insufficient documentation

## 2013-06-03 DIAGNOSIS — I7300 Raynaud's syndrome without gangrene: Secondary | ICD-10-CM | POA: Insufficient documentation

## 2013-06-03 DIAGNOSIS — J449 Chronic obstructive pulmonary disease, unspecified: Secondary | ICD-10-CM

## 2013-06-03 DIAGNOSIS — I259 Chronic ischemic heart disease, unspecified: Secondary | ICD-10-CM | POA: Insufficient documentation

## 2013-06-03 DIAGNOSIS — R109 Unspecified abdominal pain: Secondary | ICD-10-CM

## 2013-06-03 DIAGNOSIS — N3941 Urge incontinence: Secondary | ICD-10-CM | POA: Insufficient documentation

## 2013-06-03 DIAGNOSIS — K5792 Diverticulitis of intestine, part unspecified, without perforation or abscess without bleeding: Secondary | ICD-10-CM

## 2013-06-03 DIAGNOSIS — D869 Sarcoidosis, unspecified: Secondary | ICD-10-CM | POA: Insufficient documentation

## 2013-06-03 DIAGNOSIS — F29 Unspecified psychosis not due to a substance or known physiological condition: Secondary | ICD-10-CM | POA: Insufficient documentation

## 2013-06-03 DIAGNOSIS — E119 Type 2 diabetes mellitus without complications: Secondary | ICD-10-CM | POA: Insufficient documentation

## 2013-06-03 DIAGNOSIS — I2510 Atherosclerotic heart disease of native coronary artery without angina pectoris: Secondary | ICD-10-CM | POA: Insufficient documentation

## 2013-06-03 HISTORY — DX: Dissociative and conversion disorder, unspecified: F44.9

## 2013-06-03 HISTORY — DX: Unspecified atrial fibrillation: I48.91

## 2013-06-03 HISTORY — DX: Nontraumatic subarachnoid hemorrhage, unspecified: I60.9

## 2013-06-03 LAB — COMPREHENSIVE METABOLIC PANEL
ALBUMIN: 3.6 g/dL (ref 3.5–5.2)
ALT: 14 U/L (ref 0–53)
AST: 25 U/L (ref 0–37)
Alkaline Phosphatase: 90 U/L (ref 39–117)
BILIRUBIN TOTAL: 0.5 mg/dL (ref 0.3–1.2)
BUN: 12 mg/dL (ref 6–23)
CHLORIDE: 103 meq/L (ref 96–112)
CO2: 25 mEq/L (ref 19–32)
CREATININE: 1.07 mg/dL (ref 0.50–1.35)
Calcium: 9.3 mg/dL (ref 8.4–10.5)
GFR calc non Af Amer: 71 mL/min — ABNORMAL LOW (ref >90)
GFR, EST AFRICAN AMERICAN: 83 mL/min — AB (ref >90)
Glucose, Bld: 115 mg/dL — ABNORMAL HIGH (ref 70–99)
Potassium: 3.8 mEq/L (ref 3.7–5.3)
Sodium: 144 mEq/L (ref 137–147)
Total Protein: 7.2 g/dL (ref 6.0–8.3)

## 2013-06-03 LAB — POC OCCULT BLOOD, ED: Fecal Occult Bld: POSITIVE — AB

## 2013-06-03 LAB — CBC WITH DIFFERENTIAL/PLATELET
BASOS ABS: 0 10*3/uL (ref 0.0–0.1)
Basophils Relative: 0 % (ref 0–1)
Eosinophils Absolute: 0.2 10*3/uL (ref 0.0–0.7)
Eosinophils Relative: 3 % (ref 0–5)
HCT: 45.3 % (ref 39.0–52.0)
Hemoglobin: 15.2 g/dL (ref 13.0–17.0)
LYMPHS PCT: 36 % (ref 12–46)
Lymphs Abs: 2.3 10*3/uL (ref 0.7–4.0)
MCH: 30.6 pg (ref 26.0–34.0)
MCHC: 33.6 g/dL (ref 30.0–36.0)
MCV: 91.1 fL (ref 78.0–100.0)
Monocytes Absolute: 0.7 10*3/uL (ref 0.1–1.0)
Monocytes Relative: 11 % (ref 3–12)
NEUTROS ABS: 3.3 10*3/uL (ref 1.7–7.7)
NEUTROS PCT: 50 % (ref 43–77)
PLATELETS: 315 10*3/uL (ref 150–400)
RBC: 4.97 MIL/uL (ref 4.22–5.81)
RDW: 15.3 % (ref 11.5–15.5)
WBC: 6.5 10*3/uL (ref 4.0–10.5)

## 2013-06-03 LAB — URINALYSIS, ROUTINE W REFLEX MICROSCOPIC
Bilirubin Urine: NEGATIVE
Glucose, UA: NEGATIVE mg/dL
Hgb urine dipstick: NEGATIVE
Ketones, ur: NEGATIVE mg/dL
LEUKOCYTES UA: NEGATIVE
NITRITE: NEGATIVE
PH: 6 (ref 5.0–8.0)
Protein, ur: NEGATIVE mg/dL
SPECIFIC GRAVITY, URINE: 1.017 (ref 1.005–1.030)
UROBILINOGEN UA: 0.2 mg/dL (ref 0.0–1.0)

## 2013-06-03 LAB — PRO B NATRIURETIC PEPTIDE: PRO B NATRI PEPTIDE: 91.4 pg/mL (ref 0–125)

## 2013-06-03 MED ORDER — IOHEXOL 300 MG/ML  SOLN
50.0000 mL | Freq: Once | INTRAMUSCULAR | Status: AC | PRN
  Administered 2013-06-03: 50 mL via ORAL

## 2013-06-03 MED ORDER — SODIUM CHLORIDE 0.9 % IV SOLN
INTRAVENOUS | Status: DC
  Administered 2013-06-03: 23:00:00 via INTRAVENOUS

## 2013-06-03 NOTE — ED Provider Notes (Addendum)
CSN: [id number]     Arrival date & time 06/03/13  2130 History   First MD Initiated Contact with Patient 06/03/13 2155     Chief Complaint  Patient presents with  . Rectal Bleeding  . Abdominal Pain     (Consider location/radiation/quality/duration/timing/severity/associated sxs/prior Treatment) Patient is a 63 y.o. male presenting with hematochezia and abdominal pain. The history is provided by the patient.  Rectal Bleeding Associated symptoms: abdominal pain   Associated symptoms: no fever and no vomiting   Abdominal Pain Associated symptoms: diarrhea and hematochezia   Associated symptoms: no chest pain, no dysuria, no fever, no nausea, no shortness of breath and no vomiting    patient brought in by EMS for rectal bleeding. This started tonight. Bright red blood per rectum. At least 2 episodes maybe 3. Prior to that patient had loose bowel movements. Patient with persistent abdominal pain says never really got better since his episodes of diverticulitis at the end of March. Patient was seen March 27 and March 30 for diverticulitis. Was recently started on Levaquin due to a lot of allergies was switched over to Cleocin on the 30th. He did not get that filled right away so he is still taking that. Patient states that abdominal pain is no worse but he does have persistent lower quadrant abdominal pain. Diverticulitis was confirmed by CT scan. Patient denies any lightheadedness or feeling like he was passed out shortness of breath or chest pain. No nausea no vomiting.  Past Medical History  Diagnosis Date  . Rheumatoid arthritis(714.0)   . Obesity   . Major depression     with acute psychotic break in 06/2010  . Hypertension   . Hyperlipidemia   . Diverticulosis of colon   . COPD (chronic obstructive pulmonary disease)   . Anxiety   . GERD (gastroesophageal reflux disease)   . Vertigo   . Fibromyalgia   . Dermatomyositis   . Myocardial infarct     mulitple (1999, 2000, 2004)  .  Raynaud's disease   . Narcotic dependence   . Peripheral neuropathy   . Internal hemorrhoids   . Ischemic heart disease   . Hiatal hernia   . Gastritis   . Diverticulitis   . Hx of adenomatous colonic polyps   . Nephrolithiasis   . Anemia   . Esophageal stricture   . Esophageal dysmotility   . Dermatomyositis   . Urge incontinence   . Otosclerosis   . Bipolar 1 disorder   . OCD (obsessive compulsive disorder)   . Sarcoidosis   . Paroxysmal a-fib   . Dysrhythmia     "irregular" (11/15/2012)  . Type II diabetes mellitus   . Seizures   . Headache(784.0)     "severe; get them often" (11/15/2012)   Past Surgical History  Procedure Laterality Date  . Knee arthroscopy w/ meniscal repair Left 2009  . Lumbar disc surgery    . Squamous papilloma   2010    removed by Dr. Constance Holster ENT, noted on tongue  . Esophagogastroduodenoscopy N/A 09/27/2012    Procedure: ESOPHAGOGASTRODUODENOSCOPY (EGD);  Surgeon: Lafayette Dragon, MD;  Location: [facility];  Service: Endoscopy;  Laterality: N/A;  . Colonoscopy N/A 09/27/2012    Procedure: COLONOSCOPY;  Surgeon: Lafayette Dragon, MD;  Location: [facility];  Service: Endoscopy;  Laterality: N/A;  . Cataract extraction w/ intraocular lens implant Left   . Lymph node dissection Right 1970's    "neck; dr thought I had Hodgkins; turned out to be  sarcoidosis" (11/15/2012)  . Tonsillectomy    . Carpal tunnel release Bilateral   . Cardiac catheterization     Family History  Problem Relation Age of Onset  . Alcohol abuse Mother   . Depression Mother   . Heart disease Mother   . Diabetes Mother   . Stroke Mother   . Diabetes Other     1/2 brother  . Hepatitis Brother    History  Substance Use Topics  . Smoking status: Current Every Day Smoker -- 0.50 packs/day for 30 years    Types: Cigarettes  . Smokeless tobacco: Never Used  . Alcohol Use: No     Comment: Alcohol stopped in September of 2014    Review of Systems  Constitutional: Negative for  fever and diaphoresis.  HENT: Negative for congestion.   Eyes: Negative for visual disturbance.  Respiratory: Negative for shortness of breath.   Cardiovascular: Negative for chest pain.  Gastrointestinal: Positive for abdominal pain, diarrhea, blood in stool, hematochezia and anal bleeding. Negative for nausea and vomiting.  Genitourinary: Negative for dysuria.  Musculoskeletal: Positive for back pain. Negative for neck pain.  Skin: Negative for rash.  Neurological: Negative for headaches.  Hematological: Does not bruise/bleed easily.  Psychiatric/Behavioral: Negative for confusion.      Allergies  Betamethasone dipropionate; Bupropion hcl; Ciprofloxacin; Clobetasol; Codeine; Escitalopram oxalate; Flagyl; Fluoxetine hcl; Fluticasone-salmeterol; Furosemide; Immune globulins; Paroxetine; Penicillins; Sulfa antibiotics; Tacrolimus; Tetanus toxoid; and Tuberculin purified protein derivative  Home Medications   Prior to Admission medications   Medication Sig Start Date End Date Taking? Authorizing Provider  albuterol-ipratropium (COMBIVENT) 18-103 MCG/ACT inhaler Inhale 1 puff into the lungs daily.    Yes Historical Provider, MD  ALPRAZolam Duanne Moron) 1 MG tablet Take 1 tablet (1 mg total) by mouth daily. 05/21/13  Yes Gerda Diss, DO  amiodarone (PACERONE) 200 MG tablet Take 1 tablet (200 mg total) by mouth daily before breakfast. 01/10/13  Yes Gerda Diss, DO  aspirin EC 81 MG EC tablet Take 1 tablet (81 mg total) by mouth daily. 10/20/12  Yes Melony Overly, MD  atorvastatin (LIPITOR) 40 MG tablet Take 1 tablet (40 mg total) by mouth daily. 03/26/13  Yes Gerda Diss, DO  divalproex (DEPAKOTE ER) 500 MG 24 hr tablet Take 500 mg by mouth daily. 01/10/13  Yes Gerda Diss, DO  esomeprazole (NEXIUM) 40 MG capsule Take 1 capsule (40 mg total) by mouth daily before breakfast. 01/10/13  Yes Gerda Diss, DO  levETIRAcetam (KEPPRA) 500 MG tablet Take 500 mg by mouth daily. 01/10/13  Yes  Gerda Diss, DO  Multiple Vitamin (MULTIVITAMIN WITH MINERALS) TABS tablet Take 1 tablet by mouth daily. 12/29/12  Yes Kandis Nab, MD  oxybutynin (DITROPAN) 5 MG tablet Take 5 mg by mouth 3 (three) times daily. 01/10/13  Yes Gerda Diss, DO  oxyCODONE-acetaminophen (PERCOCET/ROXICET) 5-325 MG per tablet Take 1 tablet by mouth every 4 (four) hours as needed for severe pain. 05/11/13  Yes Hoy Morn, MD  potassium chloride SA (K-DUR,KLOR-CON) 20 MEQ tablet Take 1 tablet (20 mEq total) by mouth daily. 04/02/13  Yes Gerda Diss, DO  rOPINIRole (REQUIP) 2 MG tablet Take 2 mg by mouth 3 (three) times daily.   Yes Historical Provider, MD  torsemide (DEMADEX) 20 MG tablet Take 1 tablet (20 mg total) by mouth daily. 01/10/13  Yes Gerda Diss, DO   BP 131/84  Pulse 63  Temp(Src) 98.5 F (36.9 C) (Oral)  Resp 18  SpO2 94% Physical Exam  Nursing note and vitals reviewed. Constitutional: He is oriented to person, place, and time. He appears well-developed and well-nourished. No distress.  HENT:  Head: Normocephalic and atraumatic.  Mucus membranes dry.  Neck: Normal range of motion.  Cardiovascular: Normal rate, regular rhythm and normal heart sounds.   No murmur heard. Pulmonary/Chest: Effort normal and breath sounds normal. No respiratory distress.  Abdominal: Soft. Bowel sounds are normal. There is tenderness.  Mild tenderness of bilateral lower quadrants.  Genitourinary: Guaiac positive stool.  Rectal exam no external hemorrhoids. No prolapsed internal hemorrhoids no fissure. No stool in the rectal vault gross blood.  Musculoskeletal: Normal range of motion.  Neurological: He is alert and oriented to person, place, and time. No cranial nerve deficit.  Skin: Skin is warm. No rash noted.         ED Course  Procedures (including critical care time) Labs Review Labs Reviewed  COMPREHENSIVE METABOLIC PANEL  CBC WITH DIFFERENTIAL  PRO B NATRIURETIC PEPTIDE   URINALYSIS, ROUTINE W REFLEX MICROSCOPIC  POC OCCULT BLOOD, ED  TYPE AND SCREEN   Results for orders placed during the hospital encounter of 06/03/13  COMPREHENSIVE METABOLIC PANEL      Result Value Ref Range   Sodium 144  137 - 147 mEq/L   Potassium 3.8  3.7 - 5.3 mEq/L   Chloride 103  96 - 112 mEq/L   CO2 25  19 - 32 mEq/L   Glucose, Bld 115 (*) 70 - 99 mg/dL   BUN 12  6 - 23 mg/dL   Creatinine, Ser 1.07  0.50 - 1.35 mg/dL   Calcium 9.3  8.4 - 10.5 mg/dL   Total Protein 7.2  6.0 - 8.3 g/dL   Albumin 3.6  3.5 - 5.2 g/dL   AST 25  0 - 37 U/L   ALT 14  0 - 53 U/L   Alkaline Phosphatase 90  39 - 117 U/L   Total Bilirubin 0.5  0.3 - 1.2 mg/dL   GFR calc non Af Amer 71 (*) >90 mL/min   GFR calc Af Amer 83 (*) >90 mL/min  CBC WITH DIFFERENTIAL      Result Value Ref Range   WBC 6.5  4.0 - 10.5 K/uL   RBC 4.97  4.22 - 5.81 MIL/uL   Hemoglobin 15.2  13.0 - 17.0 g/dL   HCT 45.3  39.0 - 52.0 %   MCV 91.1  78.0 - 100.0 fL   MCH 30.6  26.0 - 34.0 pg   MCHC 33.6  30.0 - 36.0 g/dL   RDW 15.3  11.5 - 15.5 %   Platelets 315  150 - 400 K/uL   Neutrophils Relative % 50  43 - 77 %   Neutro Abs 3.3  1.7 - 7.7 K/uL   Lymphocytes Relative 36  12 - 46 %   Lymphs Abs 2.3  0.7 - 4.0 K/uL   Monocytes Relative 11  3 - 12 %   Monocytes Absolute 0.7  0.1 - 1.0 K/uL   Eosinophils Relative 3  0 - 5 %   Eosinophils Absolute 0.2  0.0 - 0.7 K/uL   Basophils Relative 0  0 - 1 %   Basophils Absolute 0.0  0.0 - 0.1 K/uL  PRO B NATRIURETIC PEPTIDE      Result Value Ref Range   Pro B Natriuretic peptide (BNP) 91.4  0 - 125 pg/mL  URINALYSIS, ROUTINE W REFLEX MICROSCOPIC  Result Value Ref Range   Color, Urine YELLOW  YELLOW   APPearance CLEAR  CLEAR   Specific Gravity, Urine 1.017  1.005 - 1.030   pH 6.0  5.0 - 8.0   Glucose, UA NEGATIVE  NEGATIVE mg/dL   Hgb urine dipstick NEGATIVE  NEGATIVE   Bilirubin Urine NEGATIVE  NEGATIVE   Ketones, ur NEGATIVE  NEGATIVE mg/dL   Protein, ur NEGATIVE   NEGATIVE mg/dL   Urobilinogen, UA 0.2  0.0 - 1.0 mg/dL   Nitrite NEGATIVE  NEGATIVE   Leukocytes, UA NEGATIVE  NEGATIVE  POC OCCULT BLOOD, ED      Result Value Ref Range   Fecal Occult Bld POSITIVE (*) NEGATIVE     Imaging Review No results found.   EKG Interpretation None      MDM   Final diagnoses:  Rectal bleeding  Diverticulitis    Patient hemodynamically stable at presentation. Patient with bright red blood per rectum rectal exam also heme positive. Patient with rectal bleeding at least 2-3 times this evening. Patient will require admission. Patient recently treated for diverticulitis the end of March and is still on Cleocin. Patient originally treated with Levaquin and then switched to Cleocin. Patient has extensive antibiotic allergy list so choice of antibiotic for limited. Patient states that he still has lower corner abdominal pain.   Labs and CT scan of the abdomen are pending. Patient will most likely require admission for the GI bleed.      Mervin Kung, MD 06/03/13 [id number]  Mervin Kung, MD 06/04/13 [phone]

## 2013-06-03 NOTE — ED Notes (Signed)
PER EMS - pt from home with c/o lower abd pain and rectal bleeding tonight.  Pt with recent dx diverticulitis and on abx.  A+Ox4.  Ambulatory.

## 2013-06-03 NOTE — ED Notes (Signed)
Bed: YV85 Expected date:  Expected time:  Means of arrival:  Comments: EMS/rectal bleeding/diverticulitus

## 2013-06-04 ENCOUNTER — Encounter (HOSPITAL_COMMUNITY): Payer: Self-pay

## 2013-06-04 ENCOUNTER — Emergency Department (HOSPITAL_COMMUNITY): Payer: Medicare HMO

## 2013-06-04 DIAGNOSIS — J449 Chronic obstructive pulmonary disease, unspecified: Secondary | ICD-10-CM

## 2013-06-04 DIAGNOSIS — K5730 Diverticulosis of large intestine without perforation or abscess without bleeding: Secondary | ICD-10-CM

## 2013-06-04 DIAGNOSIS — K5790 Diverticulosis of intestine, part unspecified, without perforation or abscess without bleeding: Secondary | ICD-10-CM | POA: Diagnosis present

## 2013-06-04 DIAGNOSIS — R109 Unspecified abdominal pain: Secondary | ICD-10-CM

## 2013-06-04 DIAGNOSIS — I2510 Atherosclerotic heart disease of native coronary artery without angina pectoris: Secondary | ICD-10-CM

## 2013-06-04 DIAGNOSIS — K921 Melena: Secondary | ICD-10-CM | POA: Diagnosis present

## 2013-06-04 DIAGNOSIS — Z7289 Other problems related to lifestyle: Secondary | ICD-10-CM

## 2013-06-04 DIAGNOSIS — K625 Hemorrhage of anus and rectum: Principal | ICD-10-CM

## 2013-06-04 DIAGNOSIS — F1027 Alcohol dependence with alcohol-induced persisting dementia: Secondary | ICD-10-CM

## 2013-06-04 DIAGNOSIS — I4891 Unspecified atrial fibrillation: Secondary | ICD-10-CM

## 2013-06-04 LAB — TYPE AND SCREEN
ABO/RH(D): O POS
ANTIBODY SCREEN: NEGATIVE

## 2013-06-04 LAB — CBC WITH DIFFERENTIAL/PLATELET
Basophils Absolute: 0 10*3/uL (ref 0.0–0.1)
Basophils Absolute: 0 10*3/uL (ref 0.0–0.1)
Basophils Relative: 0 % (ref 0–1)
Basophils Relative: 0 % (ref 0–1)
EOS ABS: 0.1 10*3/uL (ref 0.0–0.7)
EOS ABS: 0.2 10*3/uL (ref 0.0–0.7)
EOS PCT: 2 % (ref 0–5)
Eosinophils Relative: 2 % (ref 0–5)
HCT: 40.8 % (ref 39.0–52.0)
HEMATOCRIT: 41.4 % (ref 39.0–52.0)
HEMOGLOBIN: 13.8 g/dL (ref 13.0–17.0)
Hemoglobin: 13.8 g/dL (ref 13.0–17.0)
LYMPHS ABS: 2.4 10*3/uL (ref 0.7–4.0)
Lymphocytes Relative: 45 % (ref 12–46)
Lymphocytes Relative: 46 % (ref 12–46)
Lymphs Abs: 3.4 10*3/uL (ref 0.7–4.0)
MCH: 30.9 pg (ref 26.0–34.0)
MCH: 30.8 pg (ref 26.0–34.0)
MCHC: 33.8 g/dL (ref 30.0–36.0)
MCHC: 33.3 g/dL (ref 30.0–36.0)
MCV: 91.5 fL (ref 78.0–100.0)
MCV: 92.4 fL (ref 78.0–100.0)
MONO ABS: 0.7 10*3/uL (ref 0.1–1.0)
MONOS PCT: 10 % (ref 3–12)
Monocytes Absolute: 0.5 10*3/uL (ref 0.1–1.0)
Monocytes Relative: 9 % (ref 3–12)
NEUTROS PCT: 44 % (ref 43–77)
Neutro Abs: 2.4 10*3/uL (ref 1.7–7.7)
Neutro Abs: 3.1 10*3/uL (ref 1.7–7.7)
Neutrophils Relative %: 42 % — ABNORMAL LOW (ref 43–77)
PLATELETS: ADEQUATE 10*3/uL (ref 150–400)
Platelets: 260 10*3/uL (ref 150–400)
RBC: 4.46 MIL/uL (ref 4.22–5.81)
RBC: 4.48 MIL/uL (ref 4.22–5.81)
RDW: 15.2 % (ref 11.5–15.5)
RDW: 15.4 % (ref 11.5–15.5)
WBC: 5.3 10*3/uL (ref 4.0–10.5)
WBC: 7.4 10*3/uL (ref 4.0–10.5)

## 2013-06-04 LAB — CLOSTRIDIUM DIFFICILE BY PCR: Toxigenic C. Difficile by PCR: NEGATIVE

## 2013-06-04 LAB — CBG MONITORING, ED: GLUCOSE-CAPILLARY: 88 mg/dL (ref 70–99)

## 2013-06-04 LAB — PROTIME-INR
INR: 1.06 (ref 0.00–1.49)
Prothrombin Time: 13.6 seconds (ref 11.6–15.2)

## 2013-06-04 LAB — APTT: APTT: 37 s (ref 24–37)

## 2013-06-04 LAB — ABO/RH: ABO/RH(D): O POS

## 2013-06-04 MED ORDER — ONDANSETRON HCL 4 MG/2ML IJ SOLN
4.0000 mg | Freq: Four times a day (QID) | INTRAMUSCULAR | Status: DC | PRN
  Administered 2013-06-04: 4 mg via INTRAVENOUS
  Filled 2013-06-04: qty 2

## 2013-06-04 MED ORDER — LORATADINE 10 MG PO TABS
10.0000 mg | ORAL_TABLET | Freq: Once | ORAL | Status: AC
  Administered 2013-06-04: 10 mg via ORAL
  Filled 2013-06-04: qty 1

## 2013-06-04 MED ORDER — IOHEXOL 300 MG/ML  SOLN
100.0000 mL | Freq: Once | INTRAMUSCULAR | Status: AC | PRN
  Administered 2013-06-04: 100 mL via INTRAVENOUS

## 2013-06-04 MED ORDER — LEVETIRACETAM 500 MG PO TABS
500.0000 mg | ORAL_TABLET | Freq: Every day | ORAL | Status: DC
  Administered 2013-06-04: 500 mg via ORAL
  Filled 2013-06-04: qty 1

## 2013-06-04 MED ORDER — ALPRAZOLAM 0.5 MG PO TABS
1.0000 mg | ORAL_TABLET | Freq: Every day | ORAL | Status: DC
  Administered 2013-06-04: 1 mg via ORAL
  Filled 2013-06-04: qty 2

## 2013-06-04 MED ORDER — ASPIRIN EC 81 MG PO TBEC
81.0000 mg | DELAYED_RELEASE_TABLET | Freq: Every day | ORAL | Status: DC
  Administered 2013-06-04: 81 mg via ORAL
  Filled 2013-06-04: qty 1

## 2013-06-04 MED ORDER — HYDROCODONE-ACETAMINOPHEN 5-325 MG PO TABS
2.0000 | ORAL_TABLET | Freq: Once | ORAL | Status: AC
  Administered 2013-06-04: 2 via ORAL
  Filled 2013-06-04: qty 2

## 2013-06-04 MED ORDER — HYDROMORPHONE HCL PF 1 MG/ML IJ SOLN
1.0000 mg | Freq: Once | INTRAMUSCULAR | Status: AC
  Administered 2013-06-04: 1 mg via INTRAVENOUS
  Filled 2013-06-04: qty 1

## 2013-06-04 MED ORDER — TORSEMIDE 20 MG PO TABS: 20.0000 mg | ORAL_TABLET | Freq: Every day | ORAL | Status: DC

## 2013-06-04 MED ORDER — PANTOPRAZOLE SODIUM 40 MG PO TBEC
40.0000 mg | DELAYED_RELEASE_TABLET | Freq: Every day | ORAL | Status: DC
  Administered 2013-06-04: 40 mg via ORAL
  Filled 2013-06-04 (×2): qty 1

## 2013-06-04 MED ORDER — AMIODARONE HCL 200 MG PO TABS
200.0000 mg | ORAL_TABLET | Freq: Every day | ORAL | Status: DC
  Filled 2013-06-04 (×2): qty 1

## 2013-06-04 MED ORDER — ONDANSETRON HCL 4 MG PO TABS: 4.0000 mg | ORAL_TABLET | Freq: Four times a day (QID) | ORAL | Status: DC | PRN

## 2013-06-04 MED ORDER — IPRATROPIUM-ALBUTEROL 0.5-2.5 (3) MG/3ML IN SOLN
3.0000 mL | Freq: Every day | RESPIRATORY_TRACT | Status: DC
  Administered 2013-06-04: 3 mL via RESPIRATORY_TRACT
  Filled 2013-06-04: qty 3

## 2013-06-04 MED ORDER — DIVALPROEX SODIUM ER 500 MG PO TB24
500.0000 mg | ORAL_TABLET | Freq: Every day | ORAL | Status: DC
  Administered 2013-06-04: 500 mg via ORAL
  Filled 2013-06-04: qty 1

## 2013-06-04 MED ORDER — ATORVASTATIN CALCIUM 40 MG PO TABS
40.0000 mg | ORAL_TABLET | Freq: Every day | ORAL | Status: DC
  Administered 2013-06-04: 40 mg via ORAL
  Filled 2013-06-04: qty 1

## 2013-06-04 MED ORDER — OXYCODONE-ACETAMINOPHEN 5-325 MG PO TABS
1.0000 | ORAL_TABLET | ORAL | Status: DC | PRN
  Administered 2013-06-04 (×2): 2 via ORAL
  Filled 2013-06-04 (×2): qty 2

## 2013-06-04 MED ORDER — ROPINIROLE HCL 1 MG PO TABS
2.0000 mg | ORAL_TABLET | Freq: Three times a day (TID) | ORAL | Status: DC
  Administered 2013-06-04 (×2): 2 mg via ORAL
  Filled 2013-06-04 (×3): qty 2

## 2013-06-04 NOTE — H&P (Signed)
[facility] Attending Admission Note: Annabell Sabal MD Personal pager:  [phone] FPTS Service Pager:  [phone]  I  have seen and examined this patient, reviewed their chart. I have discussed this patient with the resident. I agree with the resident's findings, assessment and care plan.  Additionally:  63 yo M with recent dx of diverticulitis end of March.  Seen at both GI office and ED prior to dx.  Diverticulitis based on CT abdomen and PE.  He now presents with 1 day history of multiple episodes BRBPR and lower, cramping abdominal pain.  Was on roughly 1 week of antibiotics, did not start immediately after dx.  Episodes started with loose bowels and progressed to only blood.  This AM, reports no further bleeding overnight but still with cramping pain.  No diarrhea overnight.  Not nauseous.   Exam: Gen:  Elderly male lying in bed, NAD Heart: RRR Abdomen:  TTP mildly BL LQ's.  No guarding or rebound. Psych:  Pleasant, alert.  Some tangential discussion but can eventually be redirected  Imp/Plan: 1.  Rectal bleeding:  Stable Hgb.  With loose stools.  Agree with checking C dif.  DC IVF as no signs of dehydration and taking PO well.  Had breakfast this AM so likely no procedures today, can make NPO tonight if needed. Agree with GI consult.    2. Multiple psych issues:  I know Mr. Winquist VERY well, used to be my outpt patient.  Hx/o borderline and dependent personality.  Very difficult to get a straight story on any medical history.  Very tangential thought process. Multiple admits for psychotic breaks, though none very recently.  May play role in hospitalization as time passes.   Alveda Reasons, MD 06/04/2013 8:21 AM

## 2013-06-04 NOTE — H&P (Signed)
[facility] Admission History and Physical Service Pager: [phone]  Patient name: Patrick Holt Medical record number: [id number] Date of birth: Jul 04, 1950 Age: 63 y.o. Gender: male  Primary Care Provider: Teresa Coombs, DO Consultants: Gi (in am) Code Status: Full  Chief Complaint: blood stools  Assessment and Plan: Patrick Holt is a 64 y.o. male presenting with X1 hx of BRBPR consistent with diverticulosis. PMH is significant for CAD, several MI's, COPD, GERD, atrial fibrillation, diverticulosis, dermatomyositis  #FEN/GI: Known diverticulosis, now with likely diverticular bleed (no evidence of diverticulitis), esophageal dysmotility, IBS; pt followed by Dr. Maurene Capes; questionable diverticulosis vs cdiff from recent antibiotic use; clinda notorious for cdiff infection; pt able to tolerate liquids; hgb stable despite active GI bleed, will hold on heparin in light of bleed, repeat hgb this morning; assess plt function (unable to obtain count on previous draw); plan to assess coagulation with PT/INR, does have nml liver fxn with AST/ALT. Type and screen done in ER.  -admit to med-surg under Dr. Mingo Amber -IVF @100  -PO with full liquid diet as tolerated; plan for NPO in light of any procedure -cdiff, stool cultures -enteric precautions -consult GI this morning -repeat CBC in am -protonix 40mg  -zofran prn  #CV: HTN, HLD, Afib; follows with Dr. Harrington Challenger, not candidate for anticoag 2/2 "bilat frontal subarachnoid hemorrhage without trauma"; no CP with GI bleed -cont to monitor per floor protocol -hold torsemide in light of GI bleed and vasc depletion -track i/os -cont amiodarone -ASA  #Psych/Neuro: Possible seizure d/o, On multiple medications; many prior admissions 2/2 psychotic behaviors, unwilling to follow up outpt witih psych; today tangential speech but otherwise A&O -cont Keppra 500 daily, depakote 500mg  daily -xanax 1 tab prn -requip 2mg  -question  of home situation?  #Pulm: COPD -cont home med combivent  #Endo: hyperlipidemia -cont ator 40mg  -could consider repeat lipid panel  #MSK: chronic pain, dermatomyositis; not currently followed by Rheum -cont home pain medication 1-2 perocet q4  #Uro: urge incontinence: on oxybutynin (beers list med) -hold med for now; do not want to cause delirium in acute setting  Prophylaxis: SCD  Disposition: admit to med surg under Dr. Mingo Amber  History of Present Illness: Patrick Holt is a 63 y.o. male presenting with 2-4 episodes of BRBPR yesterday evening assc with loose BMs. And persistent lower quadrant abdominal discomfort. Patient with hx of diverticulitis/diverticulosis, last seen on march 27, initially prescribed levaquin then switched to clinda on the 30th. Currently still taking medication (about 1 week into course) No Sob, CP, dyspnea, dizziness, nausea, emesis.   In the ED pt witnessed to have BRB on rectal exam, heme positive. Labs obtained demonstrating hgb 15.2 ->13.8. No WBC. Platelets unable to be assessed 2/2 clumping. U/A neg, Ct with diverticulosis, no acute process. Pt hemodynamically stable, transferred for further care.  Review Of Systems: Per HPI with the following additions: none Otherwise 12 point review of systems was performed and was unremarkable.  Patient Active Problem List   Diagnosis Date Noted  . Hematochezia 06/04/2013  . Pain in joint, lower leg 12/08/2012  . Dehydration 11/09/2012  . Contact dermatitis 11/09/2012  . Hypokalemia 10/03/2012  . Benign neoplasm of colon 09/27/2012  . Stricture and stenosis of esophagus 09/27/2012  . Diverticulitis of colon without hemorrhage 07/26/2012  . Polypharmacy 03/26/2012  . Thought disorder 02/21/2012    Class: Chronic  . 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 Psychotic disorder 02/14/2012  . Seizures 02/10/2012  .  Atrial fibrillation 02/05/2012  . Coronary atherosclerosis of  native coronary artery 02/05/2012  . High risk social situation 10/15/2011  . Dental attrition, excessive 03/10/2011  . Radicular low back pain 03/09/2011  . History of seizure disorder 01/29/2011  . Irritable bowel syndrome (IBS) 06/24/2010  . Urge incontinence 06/19/2010  . TOBACCO USER 11/23/2008  . DEPRESSION, MAJOR 05/31/2008  . ISCHEMIC HEART DISEASE 05/31/2008  . RAYNAUD'S DISEASE 05/31/2008  . HIATAL HERNIA 05/31/2008  . Rheumatoid arthritis(714.0) 05/31/2008  . FIBROMYALGIA 05/31/2008  . Generalized pain 05/31/2008  . DIABETES MELLITUS, TYPE II 01/26/2008  . HYPERLIPIDEMIA 01/26/2008  . ANXIETY, CHRONIC 01/26/2008  . HYPERTENSION 01/26/2008  . HEMORRHOIDS, INTERNAL 01/26/2008  . COPD 01/26/2008  . ESOPHAGEAL MOTILITY DISORDER 01/26/2008  . GERD 01/26/2008  . DIVERTICULOSIS OF COLON 01/26/2008  . Dermatomyositis 01/26/2008   Past Medical History: Past Medical History  Diagnosis Date  . Rheumatoid arthritis(714.0)   . Obesity   . Major depression     with acute psychotic break in 06/2010  . Hypertension   . Hyperlipidemia   . Diverticulosis of colon   . COPD (chronic obstructive pulmonary disease)   . Anxiety   . GERD (gastroesophageal reflux disease)   . Vertigo   . Fibromyalgia   . Dermatomyositis   . Myocardial infarct     mulitple (1999, 2000, 2004)  . Raynaud's disease   . Narcotic dependence   . Peripheral neuropathy   . Internal hemorrhoids   . Ischemic heart disease   . Hiatal hernia   . Gastritis   . Diverticulitis   . Hx of adenomatous colonic polyps   . Nephrolithiasis   . Anemia   . Esophageal stricture   . Esophageal dysmotility   . Dermatomyositis   . Urge incontinence   . Otosclerosis   . Bipolar 1 disorder   . OCD (obsessive compulsive disorder)   . Sarcoidosis   . Paroxysmal a-fib   . Dysrhythmia     "irregular" (11/15/2012)  . Type II diabetes mellitus   . Seizures   . Headache(784.0)     "severe; get them often" (11/15/2012)    Past Surgical History: Past Surgical History  Procedure Laterality Date  . Knee arthroscopy w/ meniscal repair Left 2009  . Lumbar disc surgery    . Squamous papilloma   2010    removed by Dr. Constance Holster ENT, noted on tongue  . Esophagogastroduodenoscopy N/A 09/27/2012    Procedure: ESOPHAGOGASTRODUODENOSCOPY (EGD);  Surgeon: Lafayette Dragon, MD;  Location: [facility];  Service: Endoscopy;  Laterality: N/A;  . Colonoscopy N/A 09/27/2012    Procedure: COLONOSCOPY;  Surgeon: Lafayette Dragon, MD;  Location: [facility];  Service: Endoscopy;  Laterality: N/A;  . Cataract extraction w/ intraocular lens implant Left   . Lymph node dissection Right 1970's    "neck; dr thought I had Hodgkins; turned out to be sarcoidosis" (11/15/2012)  . Tonsillectomy    . Carpal tunnel release Bilateral   . Cardiac catheterization     Social History: History  Substance Use Topics  . Smoking status: Current Every Day Smoker -- 0.50 packs/day for 30 years    Types: Cigarettes  . Smokeless tobacco: Never Used  . Alcohol Use: No     Comment: Alcohol stopped in September of 2014   Additional social history: current smoker Please also refer to relevant sections of EMR.  Family History: Family History  Problem Relation Age of Onset  . Alcohol abuse Mother   . Depression Mother   .  Heart disease Mother   . Diabetes Mother   . Stroke Mother   . Diabetes Other     1/2 brother  . Hepatitis Brother    Allergies and Medications: Allergies  Allergen Reactions  . Betamethasone Dipropionate     Unknown  . Bupropion Hcl     Unknown  . Ciprofloxacin     REACTION: swelling  . Clobetasol     Unknown  . Codeine     Unknown  . Escitalopram Oxalate     Unknown  . Flagyl [Metronidazole]     REACTION: swelling  . Fluoxetine Hcl     Unknown  . Fluticasone-Salmeterol     Unknown  . Furosemide     Unknown  . Immune Globulins     Acute renal failure  . Paroxetine     Unknown  . Penicillins     Unknown   . Sulfa Antibiotics Other (See Comments)    blisters  . Tacrolimus     Unknown  . Tetanus Toxoid     Unknown  . Tuberculin Purified Protein Derivative     Unknown   No current facility-administered medications on file prior to encounter.   Current Outpatient Prescriptions on File Prior to Encounter  Medication Sig Dispense Refill  . albuterol-ipratropium (COMBIVENT) 18-103 MCG/ACT inhaler Inhale 1 puff into the lungs daily.       Marland Kitchen ALPRAZolam (XANAX) 1 MG tablet Take 1 tablet (1 mg total) by mouth daily.  90 tablet  2  . amiodarone (PACERONE) 200 MG tablet Take 1 tablet (200 mg total) by mouth daily before breakfast.  90 tablet  1  . aspirin EC 81 MG EC tablet Take 1 tablet (81 mg total) by mouth daily.      Marland Kitchen atorvastatin (LIPITOR) 40 MG tablet Take 1 tablet (40 mg total) by mouth daily.  90 tablet  1  . esomeprazole (NEXIUM) 40 MG capsule Take 1 capsule (40 mg total) by mouth daily before breakfast.  90 capsule  1  . Multiple Vitamin (MULTIVITAMIN WITH MINERALS) TABS tablet Take 1 tablet by mouth daily.  90 tablet  3  . oxybutynin (DITROPAN) 5 MG tablet Take 5 mg by mouth 3 (three) times daily.      Marland Kitchen oxyCODONE-acetaminophen (PERCOCET/ROXICET) 5-325 MG per tablet Take 1 tablet by mouth every 4 (four) hours as needed for severe pain.  20 tablet  0  . potassium chloride SA (K-DUR,KLOR-CON) 20 MEQ tablet Take 1 tablet (20 mEq total) by mouth daily.  90 tablet  1  . rOPINIRole (REQUIP) 2 MG tablet Take 2 mg by mouth 3 (three) times daily.      Marland Kitchen torsemide (DEMADEX) 20 MG tablet Take 1 tablet (20 mg total) by mouth daily.  90 tablet  1  . [DISCONTINUED] oxybutynin (DITROPAN) 5 MG tablet Take 1 tablet (5 mg total) by mouth 3 (three) times daily.  90 tablet  3    Objective: BP 135/79  Pulse 52  Temp(Src) 98.3 F (36.8 C) (Oral)  Resp 20  Ht 5\' 5"  (1.651 m)  Wt 199 lb 4.7 oz (90.4 kg)  BMI 33.16 kg/m2  SpO2 94% Exam: Gen: NAD, alert, cooperative with exam, hyperverbose HEENT:  NCAT, EOMI, PERRL Neck: FROM, supple CV: bradycardic with RR, good S1/S2, no murmur, cap refill <3 Resp: CTABL, no wheezes, non-labored Abd: SND, tender in bilat lower quadrants, BS present, no guarding or organomegaly; rectal exam deferred as normal in ED Ext: No edema, warm, normal tone,  moves UE/LE spontaneously; onchomyccosis, difficult to palpate DPs Neuro: Alert and oriented, No gross deficits. Tangential but redirectable  Skin: no rashes no lesions   Labs and Imaging: CBC BMET   Recent Labs Lab 06/04/13 0310  WBC 5.3  HGB 13.8  HCT 40.8  PLT PLATELET CLUMPS NOTED ON SMEAR, COUNT APPEARS ADEQUATE    Recent Labs Lab 06/03/13 2130  NA 144  K 3.8  CL 103  CO2 25  BUN 12  CREATININE 1.07  GLUCOSE 115*  CALCIUM 9.3     CT 06/04/13 IMPRESSION:  No acute intra-abdominal or intrapelvic process.  Diverticulosis without definitive evidence of acute diverticulitis   Langston Masker, MD 06/04/2013, 5:33 AM PGY-1, [place] Intern pager: [phone], text pages welcome  PGY-3 Addendum I have seen and examined this patient. I agree with the above note with my edits in pink.  Melony Overly 06/04/2013, 7:02 AM

## 2013-06-04 NOTE — Progress Notes (Signed)
Pt called out for RN after GI consult.  Pt was irate and saying that he is going to leave the hospital, claiming that he was treated like a "filthy dog" by the MD and that he was going to "sue her ass" if anything was found to be wrong after discharge. Pt was presented with options, ie-waiting for discharge order vs signing [place] papers.  Pt said he would wait for MD to write DC orders.  Pt educated on the repercussions surrounding leaving AMA.  Teaching services MD notified, waiting on possible D/C orders.  Will monitor pt closely.

## 2013-06-04 NOTE — Discharge Summary (Signed)
[facility] Discharge Summary  Patient name: Patrick Holt Medical record number: [id number] Date of birth: [birth year] Age: 63 y.o. Gender: male Date of Admission: 06/03/2013  Date of Discharge: 06/04/13 Admitting Physician: Alveda Reasons, MD  Primary Care Provider: Teresa Coombs, DO Consultants: GI  Indication for Hospitalization: abdominal pain, hematochezia  Discharge Diagnoses/Problem List:  Patient Active Problem List   Diagnosis Date Noted  . Hematochezia 06/04/2013  . Diverticulosis 06/04/2013  . Benign neoplasm of colon 09/27/2012  . 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 Psychotic disorder 02/14/2012  . Seizures 02/10/2012  . Atrial fibrillation 02/05/2012  . Coronary atherosclerosis of native coronary artery 02/05/2012  . Irritable bowel syndrome (IBS) 06/24/2010  . Urge incontinence 06/19/2010  . TOBACCO USER 11/23/2008  . DEPRESSION, MAJOR 05/31/2008  . ISCHEMIC HEART DISEASE 05/31/2008  . RAYNAUD'S DISEASE 05/31/2008  . HIATAL HERNIA 05/31/2008  . Dermatomyositis 01/26/2008    Disposition: home  Discharge Condition: stable  Discharge Exam:  BP 110/66  Pulse 44  Temp(Src) 97.6 F (36.4 C) (Oral)  Resp 20  Ht 5\' 5"  (1.651 m)  Wt 199 lb 4.7 oz (90.4 kg)  BMI 33.16 kg/m2  SpO2 91% Gen: NAD, alert, cooperative with exam, hyperverbose  HEENT: NCAT, EOMI, PERRL  Neck: FROM, supple  CV: bradycardic with RR, good S1/S2, no murmur, cap refill <3  Resp: CTABL, no wheezes, non-labored  Abd: SND, tender in bilat lower quadrants, BS present, no guarding or organomegaly; rectal exam deferred as normal in ED  Ext: No edema, warm, normal tone, moves UE/LE spontaneously; onchomyccosis, difficult to palpate DPs  Neuro: Alert and oriented, No gross deficits. Tangential but redirectable  Skin: no rashes no lesions  Brief Hospital Course:  ALDRED MASE is a 63 y.o. male presenting with X1 hx of  BRBPR consistent with diverticulosis. PMH is significant for CAD, several MI's, COPD, GERD, atrial fibrillation, diverticulosis, dermatomyositis   #FEN/GI: Pt initially present to ED with abdominal pain march 27th, CT with diverticulitis, started on levaquin, f/up'd on the 30th for ongoing pain and was switched to clindamycin. Did not start that course until about 1 week ago. Then subsequently developed 2-4 loose bloody stools on night of admission.  Presented to [facility] where was hemodynamically stable, hgb 15.1 -> repeat 13.8 with heme post stool, repeat CT with resolution of diverticulitis. Does have known diverticulosis, esophageal dysmotility, IBS; pt followed by Dr. Olevia Perches. On admission c/o bilat lower quad abdominal pain. Cdiff obtained, negative. Stool cultures pending. Cont'd on protonix and zofran. Pt able to tolerate full diet. GI was consulted rec outpt follow up as no acute issue and no longer actively having bleeding. Offered him f/up with Dr. Olevia Perches but pt declined. Was instructed to f/up as needed.   #CV: HTN, HLD, Afib; follows with Dr. Harrington Challenger, not candidate for anticoag 2/2 "bilat frontal subarachnoid hemorrhage without trauma"; Denied any CP, SOB in light of recent GI bleed. Home torsemide was held initially for concern of GI bleed and vasc depletion. Cont'd on home amiodarone. In regular rhythm throughout admission.  #Psych/Neuro: Possible seizure d/o 2/2 benzo withdrawal, On multiple medications (cont'd on keppra, depakote xanax and requip); many prior admissions 2/2 psychotic behaviors, unwilling to follow up outpt witih psych; Tangential speech but re-directable. Had a personality conflict with GI PA. Threatened to leave AMA several times, eventually d/c orders received and pt left unit unattended.  #Pulm: COPD. Cont'd on home med combivent   #Endo:  Hx of hyperlipidemia. Cont'd on ator 40. Consider outpt lipid panel (may not change management, as pt already needs high dose statin based on  risk factors).   #MSK: has hx of chronic pain, dermatomyositis; not currently followed by Rheum 2/2 poor med follow up. Continued on percocet q4 (med provided by ED when seen in march).   #Uro: Has hx of urge incontinence: On oxybutynin at home (beers list med)  Held in house as did not want to cause delirium in acute setting  Issues for Follow Up:  1. Outpt follow up with Dr. Olevia Perches 2. Home situation? Concerns for homelessness in upcoming month  Significant Procedures: none  Significant Labs and Imaging:   Recent Labs Lab 06/03/13 2130 06/04/13 0310 06/04/13 0900  WBC 6.5 5.3 7.4  HGB 15.2 13.8 13.8  HCT 45.3 40.8 41.4  PLT 315 PLATELET CLUMPS NOTED ON SMEAR, COUNT APPEARS ADEQUATE 260    Recent Labs Lab 06/03/13 2130  NA 144  K 3.8  CL 103  CO2 25  GLUCOSE 115*  BUN 12  CREATININE 1.07  CALCIUM 9.3  ALKPHOS 90  AST 25  ALT 14  ALBUMIN 3.6   CT 06/04/13  IMPRESSION:  No acute intra-abdominal or intrapelvic process.  Diverticulosis without definitive evidence of acute diverticulitis   Results/Tests Pending at Time of Discharge: stool culture  Discharge Medications:    Medication List         albuterol-ipratropium 18-103 MCG/ACT inhaler  Commonly known as:  COMBIVENT  Inhale 1 puff into the lungs daily.     ALPRAZolam 1 MG tablet  Commonly known as:  XANAX  Take 1 tablet (1 mg total) by mouth daily.     amiodarone 200 MG tablet  Commonly known as:  PACERONE  Take 1 tablet (200 mg total) by mouth daily before breakfast.     aspirin 81 MG EC tablet  Take 1 tablet (81 mg total) by mouth daily.     atorvastatin 40 MG tablet  Commonly known as:  LIPITOR  Take 1 tablet (40 mg total) by mouth daily.     divalproex 500 MG 24 hr tablet  Commonly known as:  DEPAKOTE ER  Take 500 mg by mouth daily.     esomeprazole 40 MG capsule  Commonly known as:  NEXIUM  Take 1 capsule (40 mg total) by mouth daily before breakfast.     levETIRAcetam 500 MG  tablet  Commonly known as:  KEPPRA  Take 500 mg by mouth daily.     multivitamin with minerals Tabs tablet  Take 1 tablet by mouth daily.     oxybutynin 5 MG tablet  Commonly known as:  DITROPAN  Take 5 mg by mouth 3 (three) times daily.     oxyCODONE-acetaminophen 5-325 MG per tablet  Commonly known as:  PERCOCET/ROXICET  Take 1 tablet by mouth every 4 (four) hours as needed for severe pain.     potassium chloride SA 20 MEQ tablet  Commonly known as:  K-DUR,KLOR-CON  Take 1 tablet (20 mEq total) by mouth daily.     rOPINIRole 2 MG tablet  Commonly known as:  REQUIP  Take 2 mg by mouth 3 (three) times daily.     torsemide 20 MG tablet  Commonly known as:  DEMADEX  Take 1 tablet (20 mg total) by mouth daily.        Discharge Instructions: Please refer to Patient Instructions section of EMR for full details.  Patient was counseled important signs  and symptoms that should prompt return to medical care, changes in medications, dietary instructions, activity restrictions, and follow up appointments.   Follow-Up Appointments:     Follow-up Information   Follow up with RIGBY, MICHAEL, DO On 06/13/2013. (1:45PM, For hospital follow-up)    Specialty:  Family Medicine   Contact information:   [place] [id number] [phone]       Schedule an appointment as soon as possible for a visit with Delfin Edis, MD. (As needed)    Specialty:  Gastroenterology   Contact information:   [address] [place] [address] [phone]       Langston Masker, MD 06/04/2013, 10:48 PM PGY-1, [place]

## 2013-06-04 NOTE — Progress Notes (Signed)
Discharge orders received, pt for discharge home today , IV D/C by the pt without order. D/C instructions given with verbalized understanding.  Pt walked off the unit unsupervised against RN advice.

## 2013-06-04 NOTE — Discharge Instructions (Signed)
Mr. Stucke, we admitted you because of your blood per rectum. We consulted gastroenterology and they did not think there was anything acute. They recommended follow-up outpatient with Dr. Olevia Perches and your PCP.

## 2013-06-04 NOTE — ED Provider Notes (Signed)
  Physical Exam  BP 127/77  Pulse 61  Temp(Src) 97.5 F (36.4 C) (Oral)  Resp 18  SpO2 94%  Physical Exam  ED Course  Procedures  MDM  Pt with hx of diverticulitis, comes in with BRBPR. H/H stable, Vitals are stable, not on anticoagulants. Pt has some abd pain, no peritoneal signs. CT is pending. Pt is on Clindamycin currently.  Varney Biles, MD 06/04/13 [phone]

## 2013-06-04 NOTE — ED Notes (Signed)
MP SB with no ectopy noted

## 2013-06-04 NOTE — Progress Notes (Signed)
UR completed 

## 2013-06-04 NOTE — Progress Notes (Signed)
MD paged to come speak to the pt about his disposition.  Pt pulled out his IV and took off his medical bracelets.  Pt coming out into the hallway every 5 minutes asking when the MD will be here.  MD called and reported he would be here within the hour. This information relayed to the pt.  He continues to come out into the hallway.

## 2013-06-04 NOTE — Consult Note (Signed)
[facility] Consult: 1:15 PM 06/04/2013  LOS: 1 day    Referring Provider: Dr Skeet Simmer  Primary Care Physician:  Teresa Coombs, DO Primary Gastroenterologist:  Dr. Olevia Perches   Reason for Consultation:  Hematochezia.    HPI: Patrick Holt (AKA Patrick Holt, pt changed his name) is a 63 y.o. male.  PMH is significant for CAD, several MI's, COPD, GERD, atrial fibrillation, dermatomyositis, fibromyalgia. Significant psych comorbidities and alcohol abuse.  Walks with cane, previously wheelchair bound.  Hx narcotics overuse, addiction.  GI hx of adenomatous colon polyps.  Esophageal dysmotility and strictures requiring dialtation, due to dematomyositis.  Recurrent hx diverticulitis.  Abdominal pain in late 04/2013, CT scan in ED confirmed sigmoid diverticulitis and started on Levaquin (cipro, pcn, metronidazole allergies), Zofran.  Seen 3/30 by GI PA for ongoing pain. Had only had 2 days of ABX at that point.  Sent to ED for objective testing and abx coverage broadend with Clindamycin. No repeat CT scan then. Pain has persisted despite a limited supply of oxycodone ("like candy, doesn't work") provided by ED.  On Saturday PM developed loose diarrheal stools and eventually had several BMs where he wiped blood from his rectum.  He is C diff negative.  Had 2 smaller, but still loose BMs today. At the second BM saw small amount of blood on tissue. No blood in commode.  Nausea without emesis briefly today.  Abdominal pain in lower abdomen persists.   No ETOH for many years.   PMD stopped all narcotics several months ago, but did get limited Rx of oxycodone from ED M/d last month.     Past Medical History  Diagnosis Date  . Rheumatoid arthritis(714.0)   . Obesity   . Major depression     with acute psychotic break in  06/2010  . Hypertension   . Hyperlipidemia   . Diverticulosis of colon   . COPD (chronic obstructive pulmonary disease)   . Anxiety   . GERD (gastroesophageal reflux disease)   . Vertigo   . Fibromyalgia   . Dermatomyositis   . Myocardial infarct     mulitple (1999, 2000, 2004)  . Raynaud's disease   . Narcotic dependence   . Peripheral neuropathy   . Internal hemorrhoids   . Ischemic heart disease   . Hiatal hernia   . Gastritis   . Diverticulitis   . Hx of adenomatous colonic polyps   . Nephrolithiasis   . Anemia   . Esophageal stricture   . Esophageal dysmotility   . Dermatomyositis   . Urge incontinence   . Otosclerosis   . Bipolar 1 disorder   . OCD (obsessive compulsive disorder)   . Sarcoidosis   . Paroxysmal a-fib   . Dysrhythmia     "irregular" (11/15/2012)  . Type II diabetes mellitus   . Seizures   . Headache(784.0)     "severe; get them often" (11/15/2012)    Past Surgical History  Procedure Laterality Date  . Knee arthroscopy w/ meniscal repair Left 2009  . Lumbar disc surgery    .  Squamous papilloma   2010    removed by Dr. Constance Holster ENT, noted on tongue  . Esophagogastroduodenoscopy N/A 09/27/2012    Procedure: ESOPHAGOGASTRODUODENOSCOPY (EGD);  Surgeon: Lafayette Dragon, MD;  Location: [facility];  Service: Endoscopy;  Laterality: N/A;  . Colonoscopy N/A 09/27/2012    Procedure: COLONOSCOPY;  Surgeon: Lafayette Dragon, MD;  Location: [facility];  Service: Endoscopy;  Laterality: N/A;  . Cataract extraction w/ intraocular lens implant Left   . Lymph node dissection Right 1970's    "neck; dr thought I had Hodgkins; turned out to be sarcoidosis" (11/15/2012)  . Tonsillectomy    . Carpal tunnel release Bilateral   . Cardiac catheterization      Prior to Admission medications   Medication Sig Start Date End Date Taking? Authorizing Provider  albuterol-ipratropium (COMBIVENT) 18-103 MCG/ACT inhaler Inhale 1 puff into the lungs daily.    Yes Historical  Provider, MD  ALPRAZolam Duanne Moron) 1 MG tablet Take 1 tablet (1 mg total) by mouth daily. 05/21/13  Yes Gerda Diss, DO  amiodarone (PACERONE) 200 MG tablet Take 1 tablet (200 mg total) by mouth daily before breakfast. 01/10/13  Yes Gerda Diss, DO  aspirin EC 81 MG EC tablet Take 1 tablet (81 mg total) by mouth daily. 10/20/12  Yes Melony Overly, MD  atorvastatin (LIPITOR) 40 MG tablet Take 1 tablet (40 mg total) by mouth daily. 03/26/13  Yes Gerda Diss, DO  divalproex (DEPAKOTE ER) 500 MG 24 hr tablet Take 500 mg by mouth daily. 01/10/13  Yes Gerda Diss, DO  esomeprazole (NEXIUM) 40 MG capsule Take 1 capsule (40 mg total) by mouth daily before breakfast. 01/10/13  Yes Gerda Diss, DO  levETIRAcetam (KEPPRA) 500 MG tablet Take 500 mg by mouth daily. 01/10/13  Yes Gerda Diss, DO  Multiple Vitamin (MULTIVITAMIN WITH MINERALS) TABS tablet Take 1 tablet by mouth daily. 12/29/12  Yes Kandis Nab, MD  oxybutynin (DITROPAN) 5 MG tablet Take 5 mg by mouth 3 (three) times daily. 01/10/13  Yes Gerda Diss, DO  oxyCODONE-acetaminophen (PERCOCET/ROXICET) 5-325 MG per tablet Take 1 tablet by mouth every 4 (four) hours as needed for severe pain. 05/11/13  Yes Hoy Morn, MD  potassium chloride SA (K-DUR,KLOR-CON) 20 MEQ tablet Take 1 tablet (20 mEq total) by mouth daily. 04/02/13  Yes Gerda Diss, DO  rOPINIRole (REQUIP) 2 MG tablet Take 2 mg by mouth 3 (three) times daily.   Yes Historical Provider, MD  torsemide (DEMADEX) 20 MG tablet Take 1 tablet (20 mg total) by mouth daily. 01/10/13  Yes Gerda Diss, DO    Scheduled Meds: . ALPRAZolam  1 mg Oral Daily  . amiodarone  200 mg Oral QAC breakfast  . aspirin EC  81 mg Oral Daily  . atorvastatin  40 mg Oral Daily  . divalproex  500 mg Oral Daily  . ipratropium-albuterol  3 mL Inhalation Daily  . levETIRAcetam  500 mg Oral Daily  . pantoprazole  40 mg Oral Daily  . rOPINIRole  2 mg Oral TID   Infusions:   PRN  Meds: ondansetron (ZOFRAN) IV, ondansetron, oxyCODONE-acetaminophen   Allergies as of 06/03/2013 - Review Complete 06/03/2013  Allergen Reaction Noted  . Betamethasone dipropionate  05/31/2008  . Bupropion hcl  01/26/2008  . Ciprofloxacin  01/26/2008  . Clobetasol  05/31/2008  . Codeine  01/26/2008  . Escitalopram oxalate  05/31/2008  . Flagyl [metronidazole]  01/26/2008  . Fluoxetine hcl  05/31/2008  . Fluticasone-salmeterol  05/31/2008  . Furosemide  04/11/2009  . Immune globulins  03/08/2011  . Paroxetine  05/31/2008  . Penicillins  01/26/2008  . Sulfa antibiotics Other (See Comments) 08/04/2012  . Tacrolimus  05/31/2008  . Tetanus toxoid  01/26/2008  . Tuberculin purified protein derivative  01/26/2008    Family History  Problem Relation Age of Onset  . Alcohol abuse Mother   . Depression Mother   . Heart disease Mother   . Diabetes Mother   . Stroke Mother   . Diabetes Other     1/2 brother  . Hepatitis Brother     History   Social History  . Marital Status: Divorced    Spouse Name: N/A    Number of Children: N/A  . Years of Education: N/A   Occupational History  . disabled    Social History Main Topics  . Smoking status: Current Every Day Smoker -- 0.50 packs/day for 30 years    Types: Cigarettes  . Smokeless tobacco: Never Used  . Alcohol Use: No     Comment: Alcohol stopped in September of 2014  . Drug Use: No  . Sexual Activity: No    REVIEW OF SYSTEMS: Constitutional:  Weight stable ENT:  No nose bleeds Pulm:  No cough or dyspnea CV:  No palpitations, no LE edema.  GU:  No hematuria, no frequency GI:  Per HPI Heme:  No use of iron.     Transfusions:  None Neuro:  No headaches, no peripheral tingling or numbness Derm:  No itching, no rash or sores.  Endocrine:  No sweats or chills.  No polyuria or dysuria Immunization:  Reviewed, up to date on pneumovax, flu.  Travel:  None beyond local counties in last few months.    PHYSICAL  EXAM: Vital signs in last 24 hours: Filed Vitals:   06/04/13 0632  BP: 126/76  Pulse: 50  Temp: 97.9 F (36.6 C)  Resp: 20   Wt Readings from Last 3 Encounters:  06/04/13 90.4 kg (199 lb 4.7 oz)  05/21/13 92.987 kg (205 lb)  05/14/13 94.348 kg (208 lb)   General: anxious wm who is comfortable Head:  No asymmetry  Eyes:  No icterus, no pallor Ears:  HOH  Nose:  No discharge Mouth:  Clear and moist.   Neck:  No mass, no JVD Lungs:  Some fine rales in right base Heart: RRR Abdomen:  Soft, minimal tenderness, ND.  No mass, no HSM.   Rectal: no tears, no pain, no blood, no mass, no stool.    Musc/Skeltl: no contractures or joint swelling Extremities:  No CCE  Neurologic:  Oriented x 3.  No gross limb weakness Skin:  No rash or sores Tattoos:  none Nodes:  No cervical adenopathy.    Psych:  Anxious, perseverating historian who digresses and is obsessed with history from 20 years ago.   Intake/Output from previous day: 04/19 0701 - 04/20 0700 In: 1000 [I.V.:1000] Out: -  Intake/Output this shift: Total I/O In: -  Out: 1 [Urine:1]  LAB RESULTS:  Recent Labs  06/03/13 2130 06/04/13 0310 06/04/13 0900  WBC 6.5 5.3 7.4  HGB 15.2 13.8 13.8  HCT 45.3 40.8 41.4  PLT 315 PLATELET CLUMPS NOTED ON SMEAR, COUNT APPEARS ADEQUATE 260   BMET Lab Results  Component Value Date   NA 144 06/03/2013   NA 142 05/14/2013   NA 142 05/10/2013   K 3.8 06/03/2013   K 3.4* 05/14/2013  K 3.6* 05/10/2013   CL 103 06/03/2013   CL 102 05/14/2013   CL 103 05/10/2013   CO2 25 06/03/2013   CO2 27 05/14/2013   CO2 26 05/10/2013   GLUCOSE 115* 06/03/2013   GLUCOSE 87 05/14/2013   GLUCOSE 90 05/10/2013   BUN 12 06/03/2013   BUN 17 05/14/2013   BUN 15 05/10/2013   CREATININE 1.07 06/03/2013   CREATININE 1.12 05/14/2013   CREATININE 0.97 05/10/2013   CALCIUM 9.3 06/03/2013   CALCIUM 9.1 05/14/2013   CALCIUM 9.0 05/10/2013   LFT  Recent Labs  06/03/13 2130  PROT 7.2  ALBUMIN 3.6  AST 25  ALT 14   ALKPHOS 90  BILITOT 0.5   PT/INR Lab Results  Component Value Date   INR 1.06 06/04/2013   INR 1.03 02/03/2012   INR 0.95 11/12/2011   Hepatitis Panel No results found for this basename: HEPBSAG, HCVAB, HEPAIGM, HEPBIGM,  in the last 72 hours C-Diff Negative PCR on 06/04/12     RADIOLOGY STUDIES: Ct Abdomen Pelvis W Contrast 06/04/2013   CLINICAL DATA: SOLN  COMPARISON:  CT ABD/PELVIS W CM dated 05/11/2013  FINDINGS: Bibasilar atelectasis.  Gallbladder is decompressed  Liver, spleen, pancreas, and adrenal glands are within normal limits  Stable renal hypodensities and cysts.  Normal appendix.  Sigmoid diverticulosis without evidence of definitive acute diverticulitis. Sigmoid colon is decompressed.  Bladder and prostate are unremarkable.  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  IMPRESSION: No acute intra-abdominal or intrapelvic process.  Diverticulosis without definitive evidence of acute diverticulitis   Electronically Signed   By: Maryclare Bean M.D.   On: 06/04/2013 01:32    ENDOSCOPIC STUDIES: 09/2012  EGD for dysphagia ENDOSCOPIC IMPRESSION:  1. The mucosa of the esophagus appeared normal  2. Stricture was found at the gastroesophageal junction , passage  of 60F Maloney dilator  3. Gastritis (inflammation) was found on the anterior wall of the  gastric antrum; biopsy to r/o H.Pylori  4. The duodenal mucosa showed no abnormalities in the bulb and  second portion of the duodenum  RECOMMENDATIONS:  1. Await pathology results  2. Anti-reflux regimen to be follow  3. Continue PPI Pathology MILD CHRONIC GASTRITIS. NO HELICOBACTER PYLORI, DYSPLASIA OR EVIDENCE OF MALIGNANCY IDENTIFIED.  09/2012 Colonoscopy Diverticulosis throughout.  2 sessile polyps removed.  Pathology  Colon, polyp(s), transverse and descending - TUBULAR ADENOMA (2). NO HIGH GRADE DYSPLASIA OR MALIGNANCY IDENTIFIED.Marland Kitchen    IMPRESSION:   *  Rectal bleeding in setting of diarrhea.  Suspect benign source such as  irritation, hemorrhoids.   *  Hx diverticulitis.  Latest episode of sigmoid diverticulitis confirmed by CT of 05/11/13.  Treated with Levaquin, then clindamycin.  CT 06/04/13 shows diverticulitis has resolved. Still has abdominal pain but unremarkable exam.   *  Hx adenomatous colon polyps 09/2012 surveillance colonoscopy due 2016.   *  Hx dysphagia with hx of esophageal strictures esophageal dysmotility due to dermatomyositis. S/p multiple EGDs/dilatations. Latest 09/2012  *  Chronic abdominal and ms pain.  Oxycodone had been stopped by PMD but was given Rx for this by ED MD recently for th diverticulitis.  *   Anxiety, depression, bipolar d/o, OCD.  Currently quite anxious.     PLAN:     *  Can follow up with PMD and Dr Olevia Perches as outpt.  *  If he will use them, can add Anusol HC suppositories for a few days.    Vena Rua  06/04/2013, 1:15 PM Pager:  [phone]  GI ATTENDING  History, laboratories, x-rays, prior endoscopy reports reviewed. Patient personally seen and examined. Agree with H&P as outlined above. The patient has multiple significant medical problems as well as a significant psychiatric history as well as a history of substance abuse. We are asked to see him regarding rectal bleeding. Recent history of uncomplicated diverticulitis treated with antibiotics. CT scan from yesterday shows resolution of problem. The patient continues to complain of abdominal pain. He has chronic pain complaints including fibromyalgia. He had an episode of minor rectal bleeding. Hemoglobin stable and normal. Unremarkable rectal exam by physician assistant. My abdominal exam is unremarkable. He winces with pain with light palpation over the superficial aspect of the abdomen. However, his abdomen is benign. Parenthetically, the patient was quite upset when I entered the room. He states he had a negative interaction with the physician assistant. States he was interrupted during the history taking  portion. Patient does, however, ramble on with the relevant issues. He threatens to have the [employer] medical licence revoked. He tells me that his third cousin is "Marion Downer"...  Anyway, From a GI standpoint, no acute problems identified. Difficult to subjectively assess him with regards to pain. CT and laboratories are fine. Obviously no significant bleeding. No new recommendations at this point. It is certainly okay for him to go home. I offered him followup with Dr. Olevia Perches. He declined. Will followup with Dr. Olevia Perches as needed. Will sign off. Thank you  Docia Chuck. Geri Seminole., M.D. [facility] Division of Gastroenterology

## 2013-06-05 NOTE — Discharge Summary (Signed)
Discussed in rounds.  Agree with Dr. Burt Ek DC documentation and management.

## 2013-06-08 LAB — STOOL CULTURE

## 2013-06-11 ENCOUNTER — Emergency Department (HOSPITAL_COMMUNITY)
Admission: EM | Admit: 2013-06-11 | Discharge: 2013-06-12 | Disposition: A | Discharge status: Home / Self Care | Payer: Medicare HMO | Attending: Emergency Medicine | Admitting: Emergency Medicine

## 2013-06-11 ENCOUNTER — Encounter (HOSPITAL_COMMUNITY): Payer: Self-pay | Admitting: Emergency Medicine

## 2013-06-11 ENCOUNTER — Other Ambulatory Visit: Payer: Self-pay | Admitting: *Deleted

## 2013-06-11 DIAGNOSIS — I10 Essential (primary) hypertension: Secondary | ICD-10-CM | POA: Insufficient documentation

## 2013-06-11 DIAGNOSIS — G40909 Epilepsy, unspecified, not intractable, without status epilepticus: Secondary | ICD-10-CM | POA: Insufficient documentation

## 2013-06-11 DIAGNOSIS — Z860100 Personal history of colon polyps, unspecified: Secondary | ICD-10-CM | POA: Insufficient documentation

## 2013-06-11 DIAGNOSIS — Y9389 Activity, other specified: Secondary | ICD-10-CM | POA: Insufficient documentation

## 2013-06-11 DIAGNOSIS — S8001XA Contusion of right knee, initial encounter: Secondary | ICD-10-CM

## 2013-06-11 DIAGNOSIS — M069 Rheumatoid arthritis, unspecified: Secondary | ICD-10-CM | POA: Insufficient documentation

## 2013-06-11 DIAGNOSIS — E669 Obesity, unspecified: Secondary | ICD-10-CM | POA: Insufficient documentation

## 2013-06-11 DIAGNOSIS — Z87442 Personal history of urinary calculi: Secondary | ICD-10-CM | POA: Insufficient documentation

## 2013-06-11 DIAGNOSIS — E119 Type 2 diabetes mellitus without complications: Secondary | ICD-10-CM | POA: Insufficient documentation

## 2013-06-11 DIAGNOSIS — M549 Dorsalgia, unspecified: Secondary | ICD-10-CM

## 2013-06-11 DIAGNOSIS — J4489 Other specified chronic obstructive pulmonary disease: Secondary | ICD-10-CM | POA: Insufficient documentation

## 2013-06-11 DIAGNOSIS — Z79899 Other long term (current) drug therapy: Secondary | ICD-10-CM | POA: Insufficient documentation

## 2013-06-11 DIAGNOSIS — I4891 Unspecified atrial fibrillation: Secondary | ICD-10-CM | POA: Insufficient documentation

## 2013-06-11 DIAGNOSIS — F329 Major depressive disorder, single episode, unspecified: Secondary | ICD-10-CM | POA: Insufficient documentation

## 2013-06-11 DIAGNOSIS — K219 Gastro-esophageal reflux disease without esophagitis: Secondary | ICD-10-CM | POA: Insufficient documentation

## 2013-06-11 DIAGNOSIS — Z862 Personal history of diseases of the blood and blood-forming organs and certain disorders involving the immune mechanism: Secondary | ICD-10-CM | POA: Insufficient documentation

## 2013-06-11 DIAGNOSIS — K2970 Gastritis, unspecified, without bleeding: Secondary | ICD-10-CM | POA: Insufficient documentation

## 2013-06-11 DIAGNOSIS — F17200 Nicotine dependence, unspecified, uncomplicated: Secondary | ICD-10-CM | POA: Insufficient documentation

## 2013-06-11 DIAGNOSIS — W050XXA Fall from non-moving wheelchair, initial encounter: Secondary | ICD-10-CM | POA: Insufficient documentation

## 2013-06-11 DIAGNOSIS — Z9889 Other specified postprocedural states: Secondary | ICD-10-CM | POA: Insufficient documentation

## 2013-06-11 DIAGNOSIS — I252 Old myocardial infarction: Secondary | ICD-10-CM | POA: Insufficient documentation

## 2013-06-11 DIAGNOSIS — IMO0001 Reserved for inherently not codable concepts without codable children: Secondary | ICD-10-CM | POA: Insufficient documentation

## 2013-06-11 DIAGNOSIS — Z7982 Long term (current) use of aspirin: Secondary | ICD-10-CM | POA: Insufficient documentation

## 2013-06-11 DIAGNOSIS — J449 Chronic obstructive pulmonary disease, unspecified: Secondary | ICD-10-CM | POA: Insufficient documentation

## 2013-06-11 DIAGNOSIS — Z880 Allergy status to penicillin: Secondary | ICD-10-CM | POA: Insufficient documentation

## 2013-06-11 DIAGNOSIS — IMO0002 Reserved for concepts with insufficient information to code with codable children: Secondary | ICD-10-CM | POA: Insufficient documentation

## 2013-06-11 DIAGNOSIS — Z791 Long term (current) use of non-steroidal anti-inflammatories (NSAID): Secondary | ICD-10-CM | POA: Insufficient documentation

## 2013-06-11 DIAGNOSIS — Z8619 Personal history of other infectious and parasitic diseases: Secondary | ICD-10-CM | POA: Insufficient documentation

## 2013-06-11 DIAGNOSIS — F411 Generalized anxiety disorder: Secondary | ICD-10-CM | POA: Insufficient documentation

## 2013-06-11 DIAGNOSIS — K2990 Gastroduodenitis, unspecified, without bleeding: Secondary | ICD-10-CM

## 2013-06-11 DIAGNOSIS — S8000XA Contusion of unspecified knee, initial encounter: Secondary | ICD-10-CM | POA: Insufficient documentation

## 2013-06-11 DIAGNOSIS — Y92009 Unspecified place in unspecified non-institutional (private) residence as the place of occurrence of the external cause: Secondary | ICD-10-CM | POA: Insufficient documentation

## 2013-06-11 MED ORDER — NAPROXEN 500 MG PO TABS: 500.0000 mg | ORAL_TABLET | Freq: Two times a day (BID) | ORAL | Status: DC

## 2013-06-11 MED ORDER — ESOMEPRAZOLE MAGNESIUM 40 MG PO CPDR: 40.0000 mg | DELAYED_RELEASE_CAPSULE | Freq: Every day | ORAL | Status: DC

## 2013-06-11 MED ORDER — OXYCODONE-ACETAMINOPHEN 5-325 MG PO TABS: 1.0000 | ORAL_TABLET | ORAL | Status: DC | PRN

## 2013-06-11 MED ORDER — OXYCODONE-ACETAMINOPHEN 5-325 MG PO TABS
2.0000 | ORAL_TABLET | Freq: Once | ORAL | Status: AC
  Administered 2013-06-11: 2 via ORAL
  Filled 2013-06-11: qty 2

## 2013-06-11 NOTE — ED Notes (Signed)
Pt reports that he fell forward out of his wheelchair yesterday, hurt his L knee and his mid back. Pt states that he has difficulty standing or walking. Pt a&o x4, NAD noted at this time

## 2013-06-11 NOTE — ED Notes (Signed)
Patient reports significant medical history, including muscular dystrophy, long term chemo use, and vertebral disc compression. Patient states he fell from his wheelchair while moving yesterday. Patient states he fell hitting his back and knees. Patient c/o back pain, left >right sided. Patient reports using a uses a cane for mobility for small distance only, states other wise he is wheelchair bound. Patient with full mobility to all extremities. Back tender to palpation.

## 2013-06-11 NOTE — ED Provider Notes (Signed)
CSN: [id number]     Arrival date & time 06/11/13  2029 History   First MD Initiated Contact with Patient 06/11/13 2253     Chief Complaint  Patient presents with  . Fall  . Knee Pain  . Back Pain     (Consider location/radiation/quality/duration/timing/severity/associated sxs/prior Treatment) HPI Comments: 63 year old male, history of multiple medical problems including rheumatoid arthritis, COPD, fibromyalgia, peripheral neuropathy, bipolar disorder, diabetes and atrial fibrillation. He presents stating that he fell out of his wheelchair yesterday, this was mechanical as he was trying to move the wheelchair over a cement step, he fell forward onto his knee and his hands but was able to get back in the wheelchair and has done relatively well since that time. He states that he has increased pain in his back that started multiple hours after the fall happened, it is located in the mid thoracic area and radiates up to the shoulders and the neck. It is worse with moving his arms. He has had no medications prior to arrival. He has been recommended to go to a pain clinic but states that he refuses to go to the pain clinic because of some uncomfortable interactions that he had with the staff.  Patient is a 63 y.o. male presenting with fall, knee pain, and back pain. The history is provided by the patient and the EMS personnel.  Fall  Knee Pain Associated symptoms: back pain   Back Pain   Past Medical History  Diagnosis Date  . Rheumatoid arthritis(714.0)   . Obesity   . Major depression     with acute psychotic break in 06/2010  . Hypertension   . Hyperlipidemia   . Diverticulosis of colon   . COPD (chronic obstructive pulmonary disease)   . Anxiety   . GERD (gastroesophageal reflux disease)   . Vertigo   . Fibromyalgia   . Dermatomyositis   . Myocardial infarct     mulitple (1999, 2000, 2004)  . Raynaud's disease   . Narcotic dependence   . Peripheral neuropathy   . Internal  hemorrhoids   . Ischemic heart disease   . Hiatal hernia   . Gastritis   . Diverticulitis   . Hx of adenomatous colonic polyps   . Nephrolithiasis   . Anemia   . Esophageal stricture   . Esophageal dysmotility   . Dermatomyositis   . Urge incontinence   . Otosclerosis   . Bipolar 1 disorder   . OCD (obsessive compulsive disorder)   . Sarcoidosis   . Paroxysmal a-fib   . Dysrhythmia     "irregular" (11/15/2012)  . Type II diabetes mellitus   . Seizures   . Headache(784.0)     "severe; get them often" (11/15/2012)  . Subarachnoid hemorrhage 01/2012    with subdural  hematoma.   . Atrial fibrillation 01/2012    with RVR  . Conversion disorder 06/2010   Past Surgical History  Procedure Laterality Date  . Knee arthroscopy w/ meniscal repair Left 2009  . Lumbar disc surgery    . Squamous papilloma   2010    removed by Dr. Constance Holster ENT, noted on tongue  . Esophagogastroduodenoscopy N/A 09/27/2012    Procedure: ESOPHAGOGASTRODUODENOSCOPY (EGD);  Surgeon: Lafayette Dragon, MD;  Location: [facility];  Service: Endoscopy;  Laterality: N/A;  . Colonoscopy N/A 09/27/2012    Procedure: COLONOSCOPY;  Surgeon: Lafayette Dragon, MD;  Location: [facility];  Service: Endoscopy;  Laterality: N/A;  . Cataract extraction w/ intraocular lens  implant Left   . Lymph node dissection Right 1970's    "neck; dr thought I had Hodgkins; turned out to be sarcoidosis" (11/15/2012)  . Tonsillectomy    . Carpal tunnel release Bilateral   . Cardiac catheterization     Family History  Problem Relation Age of Onset  . Alcohol abuse Mother   . Depression Mother   . Heart disease Mother   . Diabetes Mother   . Stroke Mother   . Diabetes Other     1/2 brother  . Hepatitis Brother    History  Substance Use Topics  . Smoking status: Current Every Day Smoker -- 0.50 packs/day for 30 years    Types: Cigarettes  . Smokeless tobacco: Never Used  . Alcohol Use: No     Comment: Alcohol stopped in September of  2014    Review of Systems  Musculoskeletal: Positive for back pain.  All other systems reviewed and are negative.     Allergies  Betamethasone dipropionate; Bupropion hcl; Ciprofloxacin; Clobetasol; Codeine; Escitalopram oxalate; Flagyl; Fluoxetine hcl; Fluticasone-salmeterol; Furosemide; Immune globulins; Paroxetine; Penicillins; Sulfa antibiotics; Tacrolimus; Tetanus toxoid; and Tuberculin purified protein derivative  Home Medications   Prior to Admission medications   Medication Sig Start Date End Date Taking? Authorizing Provider  albuterol-ipratropium (COMBIVENT) 18-103 MCG/ACT inhaler Inhale 1 puff into the lungs daily.     Historical Provider, MD  ALPRAZolam Duanne Moron) 1 MG tablet Take 1 tablet (1 mg total) by mouth daily. 05/21/13   Gerda Diss, DO  amiodarone (PACERONE) 200 MG tablet Take 1 tablet (200 mg total) by mouth daily before breakfast. 01/10/13   Gerda Diss, DO  aspirin EC 81 MG EC tablet Take 1 tablet (81 mg total) by mouth daily. 10/20/12   Melony Overly, MD  atorvastatin (LIPITOR) 40 MG tablet Take 1 tablet (40 mg total) by mouth daily. 03/26/13   Gerda Diss, DO  divalproex (DEPAKOTE ER) 500 MG 24 hr tablet Take 500 mg by mouth daily. 01/10/13   Gerda Diss, DO  esomeprazole (NEXIUM) 40 MG capsule Take 1 capsule (40 mg total) by mouth daily before breakfast. 06/11/13   Gerda Diss, DO  levETIRAcetam (KEPPRA) 500 MG tablet Take 500 mg by mouth daily. 01/10/13   Gerda Diss, DO  Multiple Vitamin (MULTIVITAMIN WITH MINERALS) TABS tablet Take 1 tablet by mouth daily. 12/29/12   Kandis Nab, MD  naproxen (NAPROSYN) 500 MG tablet Take 1 tablet (500 mg total) by mouth 2 (two) times daily with a meal. 06/11/13   Johnna Acosta, MD  oxybutynin (DITROPAN) 5 MG tablet Take 5 mg by mouth 3 (three) times daily. 01/10/13   Gerda Diss, DO  oxyCODONE-acetaminophen (PERCOCET) 5-325 MG per tablet Take 1 tablet by mouth every 4 (four) hours as needed. 06/11/13    Johnna Acosta, MD  oxyCODONE-acetaminophen (PERCOCET/ROXICET) 5-325 MG per tablet Take 1 tablet by mouth every 4 (four) hours as needed for severe pain. 05/11/13   Hoy Morn, MD  potassium chloride SA (K-DUR,KLOR-CON) 20 MEQ tablet Take 1 tablet (20 mEq total) by mouth daily. 04/02/13   Gerda Diss, DO  rOPINIRole (REQUIP) 2 MG tablet Take 2 mg by mouth 3 (three) times daily.    Historical Provider, MD  torsemide (DEMADEX) 20 MG tablet Take 1 tablet (20 mg total) by mouth daily. 01/10/13   Gerda Diss, DO   BP 120/81  Pulse 124  Temp(Src) 99.1 F (37.3 C) (Oral)  Resp 18  SpO2 94% Physical Exam  Nursing note and vitals reviewed. Constitutional: He appears well-developed and well-nourished. No distress.  HENT:  Head: Normocephalic and atraumatic.  Mouth/Throat: Oropharynx is clear and moist. No oropharyngeal exudate.  Eyes: Conjunctivae and EOM are normal. Pupils are equal, round, and reactive to light. Right eye exhibits no discharge. Left eye exhibits no discharge. No scleral icterus.  Neck: Normal range of motion. Neck supple. No JVD present. No thyromegaly present.  Cardiovascular: Normal rate, regular rhythm, normal heart sounds and intact distal pulses.  Exam reveals no gallop and no friction rub.   No murmur heard. Normal pulse on my exam  Pulmonary/Chest: Effort normal and breath sounds normal. No respiratory distress. He has no wheezes. He has no rales. He exhibits no tenderness.  No tenderness over the ribs  Musculoskeletal: Normal range of motion. He exhibits tenderness ( Tenderness present over the paraspinal muscles of the thoracic back, the rhomboids in the trapezius muscles left greater than right). He exhibits no edema.  Lymphadenopathy:    He has no cervical adenopathy.  Neurological: He is alert. Coordination normal.  Moves all extremities x4, able to straight leg raise bilaterally  Skin: Skin is warm and dry. No rash noted. No erythema.  Mild bruising over  the left patella  Psychiatric: He has a normal mood and affect. His behavior is normal.    ED Course  Procedures (including critical care time) Labs Review Labs Reviewed - No data to display  Imaging Review No results found.    MDM   Final diagnoses:  Back pain  Contusion of right knee    The patient has no pain in his knee despite having the abrasion, he he describes his back pain very dramatically but is able to move his arms without any difficulty while explaining his symptoms. I doubt that he has any significant trauma to his back, this is likely muscle spasm, he lives by himself, he will be transported by ambulance back to his home as he has no relatives or friends in this area, wheelchair at home.   Meds given in ED:  Medications  oxyCODONE-acetaminophen (PERCOCET/ROXICET) 5-325 MG per tablet 2 tablet (not administered)    New Prescriptions   NAPROXEN (NAPROSYN) 500 MG TABLET    Take 1 tablet (500 mg total) by mouth 2 (two) times daily with a meal.   OXYCODONE-ACETAMINOPHEN (PERCOCET) 5-325 MG PER TABLET    Take 1 tablet by mouth every 4 (four) hours as needed.        Johnna Acosta, MD 06/11/13 [phone]

## 2013-06-11 NOTE — Discharge Instructions (Signed)
I have offered you xray of your knee and you have declined.  Your back pain is likely a muscle strain - this may hurt for several days - take the medicines as directed.  Please call your doctor for a followup appointment within 24-48 hours. When you talk to your doctor please let them know that you were seen in the emergency department and have them acquire all of your records so that they can discuss the findings with you and formulate a treatment plan to fully care for your new and ongoing problems.

## 2013-06-11 NOTE — ED Notes (Signed)
Per [facility] report: pt from home: pt was in his wheelchair and fell forward yesterday.  Pt c/o of knee pain. PTAR noted old abrasions on his knees. Pt denies LOC.  Pt not on blood thinners.  PT[facility] observed pt ambulate today.

## 2013-06-12 NOTE — ED Notes (Signed)
Pt was finally given a cab voucher approved by the [facility], it was explained to patient at length that he would not be given a cab voucher from this facility again, that it's really against policy except in extreme conditions. Pt understood the decision and agreed that next time he would definitely have a ride home.

## 2013-06-12 NOTE — ED Notes (Signed)
Patient relayed to nurse that he was unable to walk, stated that he was only able to take a few small steps and is otherwise wheelchair bound. Spoke to [employer] after arranging transport, [facility] staff who transported patient in states the patient was able to walk from his home to the ambulance and was able to climb with minimal assistance into the ambulance. Patient was refused transport by [facility] due to his ability to ambulate with a cane. Patient was given discharge instructions and a cab voucher. Patient angry that we will not give him a cab voucher for his transport home. Patient was able to dress independently. Patient taken to lobby via wheelchair. MD notified of situation and voided patient narcotic Rx prior to discharge.

## 2013-06-13 ENCOUNTER — Inpatient Hospital Stay: Payer: Self-pay | Admitting: Sports Medicine

## 2013-06-19 ENCOUNTER — Emergency Department (HOSPITAL_COMMUNITY)
Admission: EM | Admit: 2013-06-19 | Discharge: 2013-06-19 | Disposition: A | Discharge status: Home / Self Care | Payer: Medicare HMO | Attending: Emergency Medicine | Admitting: Emergency Medicine

## 2013-06-19 ENCOUNTER — Emergency Department (HOSPITAL_COMMUNITY): Payer: Medicare HMO

## 2013-06-19 ENCOUNTER — Encounter (HOSPITAL_COMMUNITY): Payer: Self-pay | Admitting: Emergency Medicine

## 2013-06-19 DIAGNOSIS — Y929 Unspecified place or not applicable: Secondary | ICD-10-CM | POA: Insufficient documentation

## 2013-06-19 DIAGNOSIS — E785 Hyperlipidemia, unspecified: Secondary | ICD-10-CM | POA: Insufficient documentation

## 2013-06-19 DIAGNOSIS — F339 Major depressive disorder, recurrent, unspecified: Secondary | ICD-10-CM | POA: Insufficient documentation

## 2013-06-19 DIAGNOSIS — G608 Other hereditary and idiopathic neuropathies: Secondary | ICD-10-CM | POA: Insufficient documentation

## 2013-06-19 DIAGNOSIS — Z79899 Other long term (current) drug therapy: Secondary | ICD-10-CM | POA: Insufficient documentation

## 2013-06-19 DIAGNOSIS — IMO0002 Reserved for concepts with insufficient information to code with codable children: Secondary | ICD-10-CM | POA: Insufficient documentation

## 2013-06-19 DIAGNOSIS — S93401A Sprain of unspecified ligament of right ankle, initial encounter: Secondary | ICD-10-CM

## 2013-06-19 DIAGNOSIS — M069 Rheumatoid arthritis, unspecified: Secondary | ICD-10-CM | POA: Insufficient documentation

## 2013-06-19 DIAGNOSIS — Z87828 Personal history of other (healed) physical injury and trauma: Secondary | ICD-10-CM | POA: Insufficient documentation

## 2013-06-19 DIAGNOSIS — I7300 Raynaud's syndrome without gangrene: Secondary | ICD-10-CM | POA: Insufficient documentation

## 2013-06-19 DIAGNOSIS — S93409A Sprain of unspecified ligament of unspecified ankle, initial encounter: Secondary | ICD-10-CM | POA: Insufficient documentation

## 2013-06-19 DIAGNOSIS — Z7982 Long term (current) use of aspirin: Secondary | ICD-10-CM | POA: Insufficient documentation

## 2013-06-19 DIAGNOSIS — W050XXA Fall from non-moving wheelchair, initial encounter: Secondary | ICD-10-CM | POA: Insufficient documentation

## 2013-06-19 DIAGNOSIS — E119 Type 2 diabetes mellitus without complications: Secondary | ICD-10-CM | POA: Insufficient documentation

## 2013-06-19 DIAGNOSIS — Z860100 Personal history of colon polyps, unspecified: Secondary | ICD-10-CM | POA: Insufficient documentation

## 2013-06-19 DIAGNOSIS — I4891 Unspecified atrial fibrillation: Secondary | ICD-10-CM | POA: Insufficient documentation

## 2013-06-19 DIAGNOSIS — F411 Generalized anxiety disorder: Secondary | ICD-10-CM | POA: Insufficient documentation

## 2013-06-19 DIAGNOSIS — Z862 Personal history of diseases of the blood and blood-forming organs and certain disorders involving the immune mechanism: Secondary | ICD-10-CM | POA: Insufficient documentation

## 2013-06-19 DIAGNOSIS — Z87442 Personal history of urinary calculi: Secondary | ICD-10-CM | POA: Insufficient documentation

## 2013-06-19 DIAGNOSIS — Z872 Personal history of diseases of the skin and subcutaneous tissue: Secondary | ICD-10-CM | POA: Insufficient documentation

## 2013-06-19 DIAGNOSIS — E669 Obesity, unspecified: Secondary | ICD-10-CM | POA: Insufficient documentation

## 2013-06-19 DIAGNOSIS — J449 Chronic obstructive pulmonary disease, unspecified: Secondary | ICD-10-CM | POA: Insufficient documentation

## 2013-06-19 DIAGNOSIS — K219 Gastro-esophageal reflux disease without esophagitis: Secondary | ICD-10-CM | POA: Insufficient documentation

## 2013-06-19 DIAGNOSIS — Z9889 Other specified postprocedural states: Secondary | ICD-10-CM | POA: Insufficient documentation

## 2013-06-19 DIAGNOSIS — I10 Essential (primary) hypertension: Secondary | ICD-10-CM | POA: Insufficient documentation

## 2013-06-19 DIAGNOSIS — I252 Old myocardial infarction: Secondary | ICD-10-CM | POA: Insufficient documentation

## 2013-06-19 DIAGNOSIS — J4489 Other specified chronic obstructive pulmonary disease: Secondary | ICD-10-CM | POA: Insufficient documentation

## 2013-06-19 DIAGNOSIS — Z8619 Personal history of other infectious and parasitic diseases: Secondary | ICD-10-CM | POA: Insufficient documentation

## 2013-06-19 DIAGNOSIS — G40909 Epilepsy, unspecified, not intractable, without status epilepticus: Secondary | ICD-10-CM | POA: Insufficient documentation

## 2013-06-19 DIAGNOSIS — F17200 Nicotine dependence, unspecified, uncomplicated: Secondary | ICD-10-CM | POA: Insufficient documentation

## 2013-06-19 DIAGNOSIS — Z880 Allergy status to penicillin: Secondary | ICD-10-CM | POA: Insufficient documentation

## 2013-06-19 DIAGNOSIS — Y9389 Activity, other specified: Secondary | ICD-10-CM | POA: Insufficient documentation

## 2013-06-19 MED ORDER — TRAMADOL HCL 50 MG PO TABS
50.0000 mg | ORAL_TABLET | Freq: Once | ORAL | Status: AC
  Administered 2013-06-19: 50 mg via ORAL
  Filled 2013-06-19: qty 1

## 2013-06-19 NOTE — ED Notes (Signed)
Pt returned from X-ray.  

## 2013-06-19 NOTE — ED Notes (Signed)
Pt comfortable with discharge and follow up instructions. No prescriptions given.
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
## 2013-06-19 NOTE — Discharge Instructions (Signed)
Continue to use rest, ice, compression and elevation to reduce pain and swelling in your ankle and foot. Follow up with your doctor for your appointment as planned for continued evaluation and treatment.    Ankle Sprain An ankle sprain is an injury to the strong, fibrous tissues (ligaments) that hold the bones of your ankle joint together.  CAUSES An ankle sprain is usually caused by a fall or by twisting your ankle. Ankle sprains most commonly occur when you step on the outer edge of your foot, and your ankle turns inward. People who participate in sports are more prone to these types of injuries.  SYMPTOMS   Pain in your ankle. The pain may be present at rest or only when you are trying to stand or walk.  Swelling.  Bruising. Bruising may develop immediately or within 1 to 2 days after your injury.  Difficulty standing or walking, particularly when turning corners or changing directions. DIAGNOSIS  Your caregiver will ask you details about your injury and perform a physical exam of your ankle to determine if you have an ankle sprain. During the physical exam, your caregiver will press on and apply pressure to specific areas of your foot and ankle. Your caregiver will try to move your ankle in certain ways. An X-ray exam may be done to be sure a bone was not broken or a ligament did not separate from one of the bones in your ankle (avulsion fracture).  TREATMENT  Certain types of braces can help stabilize your ankle. Your caregiver can make a recommendation for this. Your caregiver may recommend the use of medicine for pain. If your sprain is severe, your caregiver may refer you to a surgeon who helps to restore function to parts of your skeletal system (orthopedist) or a physical therapist. Happy ice to your injury for 1 2 days or as directed by your caregiver. Applying ice helps to reduce inflammation and pain.  Put ice in a plastic bag.  Place a towel between  your skin and the bag.  Leave the ice on for 15-20 minutes at a time, every 2 hours while you are awake.  Only take over-the-counter or prescription medicines for pain, discomfort, or fever as directed by your caregiver.  Elevate your injured ankle above the level of your heart as much as possible for 2 3 days.  If your caregiver recommends crutches, use them as instructed. Gradually put weight on the affected ankle. Continue to use crutches or a cane until you can walk without feeling pain in your ankle.  If you have a plaster splint, wear the splint as directed by your caregiver. Do not rest it on anything harder than a pillow for the first 24 hours. Do not put weight on it. Do not get it wet. You may take it off to take a shower or bath.  You may have been given an elastic bandage to wear around your ankle to provide support. If the elastic bandage is too tight (you have numbness or tingling in your foot or your foot becomes cold and blue), adjust the bandage to make it comfortable.  If you have an air splint, you may blow more air into it or let air out to make it more comfortable. You may take your splint off at night and before taking a shower or bath. Wiggle your toes in the splint several times per day to decrease swelling. SEEK MEDICAL CARE IF:   You have  rapidly increasing bruising or swelling.  Your toes feel extremely cold or you lose feeling in your foot.  Your pain is not relieved with medicine. SEEK IMMEDIATE MEDICAL CARE IF:  Your toes are numb or blue.  You have severe pain that is increasing. MAKE SURE YOU:   Understand these instructions.  Will watch your condition.  Will get help right away if you are not doing well or get worse. Document Released: 02/01/2005 Document Revised: 10/27/2011 Document Reviewed: 02/13/2011 [facility] Patient Information 2014 [place], [place].

## 2013-06-20 ENCOUNTER — Ambulatory Visit: Payer: Self-pay | Admitting: Sports Medicine

## 2013-06-20 ENCOUNTER — Ambulatory Visit (INDEPENDENT_AMBULATORY_CARE_PROVIDER_SITE_OTHER): Payer: Medicare HMO | Admitting: Sports Medicine

## 2013-06-20 VITALS — BP 108/64 | HR 77 | Temp 98.2°F

## 2013-06-20 DIAGNOSIS — R52 Pain, unspecified: Secondary | ICD-10-CM

## 2013-06-20 DIAGNOSIS — K5730 Diverticulosis of large intestine without perforation or abscess without bleeding: Secondary | ICD-10-CM

## 2013-06-20 DIAGNOSIS — M25571 Pain in right ankle and joints of right foot: Secondary | ICD-10-CM | POA: Insufficient documentation

## 2013-06-20 DIAGNOSIS — M25579 Pain in unspecified ankle and joints of unspecified foot: Secondary | ICD-10-CM

## 2013-06-20 DIAGNOSIS — I10 Essential (primary) hypertension: Secondary | ICD-10-CM

## 2013-06-20 MED ORDER — IPRATROPIUM-ALBUTEROL 18-103 MCG/ACT IN AERO: 1.0000 | INHALATION_SPRAY | Freq: Every day | RESPIRATORY_TRACT | Status: DC

## 2013-06-20 MED ORDER — DIVALPROEX SODIUM ER 500 MG PO TB24
500.0000 mg | ORAL_TABLET | Freq: Every day | ORAL | Status: DC
Start: 2013-06-20 — End: 2014-06-25

## 2013-06-20 MED ORDER — ALPRAZOLAM 1 MG PO TABS: 1.0000 mg | ORAL_TABLET | Freq: Every day | ORAL | Status: DC

## 2013-06-20 MED ORDER — AMIODARONE HCL 200 MG PO TABS: 200.0000 mg | ORAL_TABLET | Freq: Every day | ORAL | Status: DC

## 2013-06-20 MED ORDER — TORSEMIDE 20 MG PO TABS: 20.0000 mg | ORAL_TABLET | Freq: Every day | ORAL | Status: DC | PRN

## 2013-06-20 MED ORDER — OXYCODONE-ACETAMINOPHEN 5-325 MG PO TABS: 1.0000 | ORAL_TABLET | Freq: Three times a day (TID) | ORAL | Status: DC | PRN

## 2013-06-20 NOTE — Patient Instructions (Addendum)
Please call and follow up with Dr. Maurene Capes and Dr. Harrington Challenger  I have sent in refills for your meds at [facility] - consider mail order.  Call your insurance to discuss this with them.  I'm giving you some pain medication to be used as needed and I am not planning on continuing this.   Take your torsemide just when needed for swelling.

## 2013-06-20 NOTE — Assessment & Plan Note (Signed)
Chronic condition, with recent acute bouts with diverticulitis.  Finish antibiotics. - He has not followed up with Dr. Maurene Capes.  He is no longer having significant infectious symptoms but continues to have nonspecific suprapubic pain.  Denies any diarrhea, melena, hematochezia.  He is quite tangential when talking about his abdominal pain and is concerned that Dr. Maurene Capes should perform another colonoscopy. 1. Encouraged to reschedule with Dr. Maurene Capes although colonoscopy at this time is likely not indicated 2. Pain medication provided as above
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
## 2013-06-20 NOTE — Assessment & Plan Note (Signed)
Chronic, worsening condition - Patient was previously out of his wheelchair but has had resume using [place] to do to his overall chronic pain symptoms.  He has a long-standing history of rheumatoid arthritis, dermatomyositis, fibromyalgia.  He is off of his methotrexate at this time due to his wishes may need to be considered to restart. 1. Short course of pain medication provided to try to improve level of functioning and allow him to get out of the wheelchair. > Consider referral back to [facility] for rheumatology given he has been kicked out of all the practices available in [place].

## 2013-06-20 NOTE — Assessment & Plan Note (Signed)
Acute condition, secondary to running foot over with Electric wheel chair - X-rays from ED reviewed and negative for fracture.  In ASO. - Some joint effusion on exam but no focal findings on exam 1. Rx for short course of pain meds to try to allow some improved mobility.  Encouraged ROM exercises.   > Follow up if not improving in next 5-7 days > Consider ankle [place] +/- arthrocentesis with injection if evidence of effusion  .

## 2013-06-20 NOTE — Assessment & Plan Note (Signed)
Chronic, well controlled condition - Previously considered decreasing torsemide and given significant control of blood pressure will discontinue at this time. 1. Torsemide when necessary lower extremity swelling. 2. Encouraged to followup with Dr. Harrington Challenger as soon as possible. > Consider ACE inhibitor at followup if BP is higher.

## 2013-06-22 ENCOUNTER — Encounter: Payer: Self-pay | Admitting: *Deleted

## 2013-06-27 NOTE — ED Provider Notes (Signed)
Medical screening examination/treatment/procedure(s) were performed by non-physician practitioner and as supervising physician I was immediately available for consultation/collaboration.   EKG Interpretation None        Tanna Furry, MD 06/27/13 [phone]

## 2013-07-30 ENCOUNTER — Emergency Department (HOSPITAL_COMMUNITY)
Admission: EM | Admit: 2013-07-30 | Discharge: 2013-07-31 | Disposition: A | Discharge status: Home / Self Care | Payer: Medicare HMO | Source: Home / Self Care | Attending: Emergency Medicine | Admitting: Emergency Medicine

## 2013-07-30 DIAGNOSIS — W57XXXA Bitten or stung by nonvenomous insect and other nonvenomous arthropods, initial encounter: Secondary | ICD-10-CM

## 2013-07-30 MED ORDER — CEPHALEXIN 500 MG PO CAPS: 500.0000 mg | ORAL_CAPSULE | Freq: Four times a day (QID) | ORAL | Status: DC

## 2013-07-30 NOTE — ED Provider Notes (Signed)
CSN: [id number]     Arrival date & time 07/30/13  2057 History  This chart was scribed for non-physician practitioner, Abigail Butts, PA-C working with Kalman Drape, MD by Frederich Balding, ED scribe. This patient was seen in room TR04C/TR04C and the patient's care was started at 11:30 PM.    Chief Complaint  Patient presents with  . Insect Bite   The history is provided by the patient. No language interpreter was used.   HPI Comments: Patrick Holt is a 63 y.o. male with history of CAD, atrial fibrillation, myocardial infarction and diverticulitis who presents to the Emergency Department complaining of a potential spider bite to his left forearm that occurred this morning. Pt has bruising and redness around the area that has  progressively worsened. He thinks a brown recluse may have bitten him because he killed one earlier in his house, but did not see what bit him. Pt states he has gotten a headache, nausea and dizziness throughout the day.  He has had abdominal pain since May 2015; unchanged from previous.  Denies SOB, hematochezia, fever, chills.    Past Medical History  Diagnosis Date  . Rheumatoid arthritis(714.0)   . Obesity   . Major depression     with acute psychotic break in 06/2010  . Hypertension   . Hyperlipidemia   . Diverticulosis of colon   . COPD (chronic obstructive pulmonary disease)   . Anxiety   . GERD (gastroesophageal reflux disease)   . Vertigo   . Fibromyalgia   . Dermatomyositis   . Myocardial infarct     mulitple (1999, 2000, 2004)  . Raynaud's disease   . Narcotic dependence   . Peripheral neuropathy   . Internal hemorrhoids   . Ischemic heart disease   . Hiatal hernia   . Gastritis   . Diverticulitis   . Hx of adenomatous colonic polyps   . Nephrolithiasis   . Anemia   . Esophageal stricture   . Esophageal dysmotility   . Dermatomyositis   . Urge incontinence   . Otosclerosis   . Bipolar 1 disorder   . OCD (obsessive compulsive  disorder)   . Sarcoidosis   . Paroxysmal a-fib   . Dysrhythmia     "irregular" (11/15/2012)  . Type II diabetes mellitus   . Seizures   . Headache(784.0)     "severe; get them often" (11/15/2012)  . Subarachnoid hemorrhage 01/2012    with subdural  hematoma.   . Atrial fibrillation 01/2012    with RVR  . Conversion disorder 06/2010  . History of narcotic addiction    Past Surgical History  Procedure Laterality Date  . Knee arthroscopy w/ meniscal repair Left 2009  . Lumbar disc surgery    . Squamous papilloma   2010    removed by Dr. Constance Holster ENT, noted on tongue  . Esophagogastroduodenoscopy N/A 09/27/2012    Procedure: ESOPHAGOGASTRODUODENOSCOPY (EGD);  Surgeon: Lafayette Dragon, MD;  Location: [facility];  Service: Endoscopy;  Laterality: N/A;  . Colonoscopy N/A 09/27/2012    Procedure: COLONOSCOPY;  Surgeon: Lafayette Dragon, MD;  Location: [facility];  Service: Endoscopy;  Laterality: N/A;  . Cataract extraction w/ intraocular lens implant Left   . Lymph node dissection Right 1970's    "neck; dr thought I had Hodgkins; turned out to be sarcoidosis" (11/15/2012)  . Tonsillectomy    . Carpal tunnel release Bilateral   . Cardiac catheterization     Family History  Problem Relation Age  of Onset  . Alcohol abuse Mother   . Depression Mother   . Heart disease Mother   . Diabetes Mother   . Stroke Mother   . Diabetes Other     1/2 brother  . Hepatitis Brother    History  Substance Use Topics  . Smoking status: Current Every Day Smoker -- 0.50 packs/day for 30 years    Types: Cigarettes  . Smokeless tobacco: Never Used  . Alcohol Use: No     Comment: Alcohol stopped in September of 2014    Review of Systems  Respiratory: Negative for shortness of breath.   Cardiovascular: Negative for chest pain.  Gastrointestinal: Positive for nausea (chronic) and abdominal pain ( chronic). Negative for blood in stool.  Skin:       Insect bite.  Neurological: Positive for dizziness and  headaches.  All other systems reviewed and are negative.  Allergies  Betamethasone dipropionate; Bupropion hcl; Ciprofloxacin; Clobetasol; Codeine; Escitalopram oxalate; Flagyl; Fluoxetine hcl; Fluticasone-salmeterol; Furosemide; Immune globulins; Paroxetine; Penicillins; Sulfa antibiotics; Tacrolimus; Tetanus toxoid; and Tuberculin purified protein derivative  Home Medications   Prior to Admission medications   Medication Sig Start Date End Date Taking? Authorizing Provider  albuterol-ipratropium (COMBIVENT) 18-103 MCG/ACT inhaler Inhale 1 puff into the lungs daily. 06/20/13  Yes Gerda Diss, DO  ALPRAZolam Duanne Moron) 1 MG tablet Take 1 tablet (1 mg total) by mouth daily. 06/20/13  Yes Gerda Diss, DO  amiodarone (PACERONE) 200 MG tablet Take 1 tablet (200 mg total) by mouth daily before breakfast. 06/20/13  Yes Gerda Diss, DO  aspirin EC 81 MG EC tablet Take 1 tablet (81 mg total) by mouth daily. 10/20/12  Yes Melony Overly, MD  atorvastatin (LIPITOR) 40 MG tablet Take 1 tablet (40 mg total) by mouth daily. 03/26/13  Yes Gerda Diss, DO  divalproex (DEPAKOTE ER) 500 MG 24 hr tablet Take 1 tablet (500 mg total) by mouth daily. 06/20/13  Yes Gerda Diss, DO  esomeprazole (NEXIUM) 40 MG capsule Take 1 capsule (40 mg total) by mouth daily before breakfast. 06/11/13  Yes Gerda Diss, DO  levETIRAcetam (KEPPRA) 500 MG tablet Take 500 mg by mouth daily. 01/10/13  Yes Gerda Diss, DO  Multiple Vitamin (MULTIVITAMIN WITH MINERALS) TABS tablet Take 1 tablet by mouth daily. 12/29/12  Yes Kandis Nab, MD  naproxen (NAPROSYN) 500 MG tablet Take 1 tablet (500 mg total) by mouth 2 (two) times daily with a meal. 06/11/13  Yes Johnna Acosta, MD  oxybutynin (DITROPAN) 5 MG tablet Take 5 mg by mouth 3 (three) times daily. 01/10/13  Yes Gerda Diss, DO  oxyCODONE-acetaminophen (ROXICET) 5-325 MG per tablet Take 1 tablet by mouth every 8 (eight) hours as needed for severe pain. 06/20/13  Yes  Gerda Diss, DO  potassium chloride SA (K-DUR,KLOR-CON) 20 MEQ tablet Take 1 tablet (20 mEq total) by mouth daily. 04/02/13  Yes Gerda Diss, DO  rOPINIRole (REQUIP) 2 MG tablet Take 2 mg by mouth 3 (three) times daily.   Yes Historical Provider, MD  torsemide (DEMADEX) 20 MG tablet Take 1 tablet (20 mg total) by mouth daily as needed. 06/20/13  Yes Gerda Diss, DO  cephALEXin (KEFLEX) 500 MG capsule Take 1 capsule (500 mg total) by mouth 4 (four) times daily. 07/30/13   Shlok Raz, PA-C   BP 155/95  Pulse 60  Temp(Src) 97.6 F (36.4 C) (Oral)  Resp 18  Ht 5\' 5"  (1.651 m)  Wt 195 lb (88.451 kg)  BMI 32.45 kg/m2  SpO2 95%  Physical Exam  Nursing note and vitals reviewed. Constitutional: He is oriented to person, place, and time. He appears well-developed and well-nourished. No distress.  Awake, alert, nontoxic appearance  HENT:  Head: Normocephalic and atraumatic.  Mouth/Throat: Oropharynx is clear and moist. No oropharyngeal exudate.  Eyes: Conjunctivae are normal. No scleral icterus.  Neck: Normal range of motion. Neck supple.  Cardiovascular: Normal rate, regular rhythm, normal heart sounds and intact distal pulses.   No irregularity.   Pulmonary/Chest: Effort normal and breath sounds normal. No respiratory distress. He has no wheezes.  Abdominal: Soft. Bowel sounds are normal. He exhibits no mass. There is no tenderness. There is no rebound and no guarding.  Musculoskeletal: Normal range of motion. He exhibits no edema.  Neurological: He is alert and oriented to person, place, and time.  Speech is clear and goal oriented Moves extremities without ataxia  Skin: Skin is warm and dry. He is not diaphoretic. There is erythema.  1 cm x 1 cm area of edema with mild ecchymosis to left lateral forearm consistent with insect bite.   Psychiatric: He has a normal mood and affect.    ED Course  Procedures (including critical care time)  DIAGNOSTIC STUDIES: Oxygen  Saturation is 95% on RA, adequate by my interpretation.    COORDINATION OF CARE: 11:37 PM-Discussed treatment plan which includes an antibiotic with pt at bedside and pt agreed to plan.   Labs Review Labs Reviewed - No data to display  Imaging Review No results found.   EKG Interpretation None      MDM   Final diagnoses:  Insect bite   JAHKI WITHAM presents history and physical consistent with insect bite to the left forearm.  No evidence of secondary infection. Patient is concerned about infection therefore he will be discharged home with antibiotics. He is to followup with his primary care doctor in 24-48 hours for wound check. He is return to emergency Department if the wound ulcerates open.  I have personally reviewed patient's vitals, nursing note and any pertinent labs or imaging.  I performed an undressed physical exam.    At this time, it has been determined that no acute conditions requiring further emergency intervention. The patient/guardian have been advised of the diagnosis and plan. I reviewed all labs and imaging including any potential incidental findings. We have discussed signs and symptoms that warrant return to the ED, such as said above.  Patient/guardian has voiced understanding and agreed to follow-up with the PCP or specialist in 24-48 hours.  Vital signs are stable at discharge.   BP 155/95  Pulse 60  Temp(Src) 97.6 F (36.4 C) (Oral)  Resp 18  Ht 5\' 5"  (1.651 m)  Wt 195 lb (88.451 kg)  BMI 32.45 kg/m2  SpO2 95%          I personally performed the services described in this documentation, which was scribed in my presence. The recorded information has been reviewed and is accurate.  Jarrett Soho Mersadez Linden, PA-C 07/31/13 0006

## 2013-07-30 NOTE — Discharge Instructions (Signed)
1. Medications: keflex, usual home medications 2. Treatment: rest, drink plenty of fluids,  3. Follow Up: Please followup with your primary doctor for discussion of your diagnoses and further evaluation after today's visit; if you do not have a primary care doctor use the resource guide provided to find one;    Insect Bite Mosquitoes, flies, fleas, bedbugs, and many other insects can bite. Insect bites are different from insect stings. A sting is when venom is injected into the skin. Some insect bites can transmit infectious diseases. SYMPTOMS  Insect bites usually turn red, swell, and itch for 2 to 4 days. They often go away on their own. TREATMENT  Your caregiver may prescribe antibiotic medicines if a bacterial infection develops in the bite. HOME CARE INSTRUCTIONS  Do not scratch the bite area.  Keep the bite area clean and dry. Wash the bite area thoroughly with soap and water.  Put ice or cool compresses on the bite area.  Put ice in a plastic bag.  Place a towel between your skin and the bag.  Leave the ice on for 20 minutes, 4 times a day for the first 2 to 3 days, or as directed.  You may apply a baking soda paste, cortisone cream, or calamine lotion to the bite area as directed by your caregiver. This can help reduce itching and swelling.  Only take over-the-counter or prescription medicines as directed by your caregiver.  If you are given antibiotics, take them as directed. Finish them even if you start to feel better. You may need a tetanus shot if:  You cannot remember when you had your last tetanus shot.  You have never had a tetanus shot.  The injury broke your skin. If you get a tetanus shot, your arm may swell, get red, and feel warm to the touch. This is common and not a problem. If you need a tetanus shot and you choose not to have one, there is a rare chance of getting tetanus. Sickness from tetanus can be serious. SEEK IMMEDIATE MEDICAL CARE IF:   You have  increased pain, redness, or swelling in the bite area.  You see a red line on the skin coming from the bite.  You have a fever.  You have joint pain.  You have a headache or neck pain.  You have unusual weakness.  You have a rash.  You have chest pain or shortness of breath.  You have abdominal pain, nausea, or vomiting.  You feel unusually tired or sleepy. MAKE SURE YOU:   Understand these instructions.  Will watch your condition.  Will get help right away if you are not doing well or get worse. Document Released: 03/11/2004 Document Revised: 04/26/2011 Document Reviewed: 09/02/2010 [facility] Patient Information 2014 [place].

## 2013-07-30 NOTE — ED Notes (Signed)
Pt reports spider bite on left forearm early this am. Called EMS. Good circulation, able to move arm without difficulty, ambulated to truck without difficulty.

## 2013-07-30 NOTE — ED Notes (Signed)
Pt c/o spider bite to left forearm. Pt states he believes a brown recluse may have bitten him. Pt has a small bruise to left forearm about the size of nickel, with redness. No pus or raised edges. Pt rates pain 10/10.

## 2013-07-31 NOTE — ED Provider Notes (Signed)
Medical screening examination/treatment/procedure(s) were performed by non-physician practitioner and as supervising physician I was immediately available for consultation/collaboration.   EKG Interpretation None       Kalman Drape, MD 07/31/13 [phone]

## 2013-07-31 NOTE — ED Notes (Signed)
Pt left prior to receiving discharge paperwork  

## 2013-08-01 ENCOUNTER — Inpatient Hospital Stay (HOSPITAL_COMMUNITY)
Admission: EM | Admit: 2013-08-01 | Discharge: 2013-08-03 | DRG: 392 | Disposition: A | Discharge status: Home / Self Care | Payer: Medicare HMO | Attending: Family Medicine | Admitting: Family Medicine

## 2013-08-01 ENCOUNTER — Emergency Department (HOSPITAL_COMMUNITY): Payer: Medicare HMO

## 2013-08-01 ENCOUNTER — Encounter (HOSPITAL_COMMUNITY): Payer: Self-pay | Admitting: Emergency Medicine

## 2013-08-01 DIAGNOSIS — E669 Obesity, unspecified: Secondary | ICD-10-CM | POA: Diagnosis present

## 2013-08-01 DIAGNOSIS — I2510 Atherosclerotic heart disease of native coronary artery without angina pectoris: Secondary | ICD-10-CM | POA: Diagnosis present

## 2013-08-01 DIAGNOSIS — Z7982 Long term (current) use of aspirin: Secondary | ICD-10-CM

## 2013-08-01 DIAGNOSIS — E119 Type 2 diabetes mellitus without complications: Secondary | ICD-10-CM | POA: Diagnosis present

## 2013-08-01 DIAGNOSIS — E785 Hyperlipidemia, unspecified: Secondary | ICD-10-CM | POA: Diagnosis present

## 2013-08-01 DIAGNOSIS — F429 Obsessive-compulsive disorder, unspecified: Secondary | ICD-10-CM | POA: Diagnosis present

## 2013-08-01 DIAGNOSIS — F17200 Nicotine dependence, unspecified, uncomplicated: Secondary | ICD-10-CM | POA: Diagnosis present

## 2013-08-01 DIAGNOSIS — J4489 Other specified chronic obstructive pulmonary disease: Secondary | ICD-10-CM | POA: Diagnosis present

## 2013-08-01 DIAGNOSIS — G609 Hereditary and idiopathic neuropathy, unspecified: Secondary | ICD-10-CM | POA: Diagnosis present

## 2013-08-01 DIAGNOSIS — K5732 Diverticulitis of large intestine without perforation or abscess without bleeding: Principal | ICD-10-CM | POA: Diagnosis present

## 2013-08-01 DIAGNOSIS — IMO0001 Reserved for inherently not codable concepts without codable children: Secondary | ICD-10-CM | POA: Diagnosis present

## 2013-08-01 DIAGNOSIS — F1920 Other psychoactive substance dependence, uncomplicated: Secondary | ICD-10-CM | POA: Diagnosis present

## 2013-08-01 DIAGNOSIS — G8929 Other chronic pain: Secondary | ICD-10-CM | POA: Diagnosis present

## 2013-08-01 DIAGNOSIS — G40909 Epilepsy, unspecified, not intractable, without status epilepticus: Secondary | ICD-10-CM | POA: Diagnosis present

## 2013-08-01 DIAGNOSIS — I7300 Raynaud's syndrome without gangrene: Secondary | ICD-10-CM | POA: Diagnosis present

## 2013-08-01 DIAGNOSIS — E876 Hypokalemia: Secondary | ICD-10-CM | POA: Diagnosis present

## 2013-08-01 DIAGNOSIS — J449 Chronic obstructive pulmonary disease, unspecified: Secondary | ICD-10-CM

## 2013-08-01 DIAGNOSIS — I252 Old myocardial infarction: Secondary | ICD-10-CM

## 2013-08-01 DIAGNOSIS — I4891 Unspecified atrial fibrillation: Secondary | ICD-10-CM

## 2013-08-01 DIAGNOSIS — F1027 Alcohol dependence with alcohol-induced persisting dementia: Secondary | ICD-10-CM | POA: Diagnosis present

## 2013-08-01 DIAGNOSIS — Z8249 Family history of ischemic heart disease and other diseases of the circulatory system: Secondary | ICD-10-CM

## 2013-08-01 DIAGNOSIS — M25579 Pain in unspecified ankle and joints of unspecified foot: Secondary | ICD-10-CM

## 2013-08-01 DIAGNOSIS — K5792 Diverticulitis of intestine, part unspecified, without perforation or abscess without bleeding: Secondary | ICD-10-CM

## 2013-08-01 DIAGNOSIS — K219 Gastro-esophageal reflux disease without esophagitis: Secondary | ICD-10-CM | POA: Diagnosis present

## 2013-08-01 DIAGNOSIS — F449 Dissociative and conversion disorder, unspecified: Secondary | ICD-10-CM | POA: Diagnosis present

## 2013-08-01 DIAGNOSIS — F411 Generalized anxiety disorder: Secondary | ICD-10-CM | POA: Diagnosis present

## 2013-08-01 DIAGNOSIS — F329 Major depressive disorder, single episode, unspecified: Secondary | ICD-10-CM | POA: Diagnosis present

## 2013-08-01 DIAGNOSIS — I259 Chronic ischemic heart disease, unspecified: Secondary | ICD-10-CM

## 2013-08-01 DIAGNOSIS — F1020 Alcohol dependence, uncomplicated: Secondary | ICD-10-CM | POA: Diagnosis present

## 2013-08-01 DIAGNOSIS — Z823 Family history of stroke: Secondary | ICD-10-CM

## 2013-08-01 DIAGNOSIS — I10 Essential (primary) hypertension: Secondary | ICD-10-CM | POA: Diagnosis present

## 2013-08-01 DIAGNOSIS — M25571 Pain in right ankle and joints of right foot: Secondary | ICD-10-CM

## 2013-08-01 DIAGNOSIS — M069 Rheumatoid arthritis, unspecified: Secondary | ICD-10-CM | POA: Diagnosis present

## 2013-08-01 DIAGNOSIS — D869 Sarcoidosis, unspecified: Secondary | ICD-10-CM | POA: Diagnosis present

## 2013-08-01 LAB — CBC WITH DIFFERENTIAL/PLATELET
BASOS ABS: 0 10*3/uL (ref 0.0–0.1)
Basophils Relative: 0 % (ref 0–1)
Eosinophils Absolute: 0.3 10*3/uL (ref 0.0–0.7)
Eosinophils Relative: 2 % (ref 0–5)
HEMATOCRIT: 46.1 % (ref 39.0–52.0)
Hemoglobin: 15.3 g/dL (ref 13.0–17.0)
LYMPHS PCT: 22 % (ref 12–46)
Lymphs Abs: 3 10*3/uL (ref 0.7–4.0)
MCH: 30.8 pg (ref 26.0–34.0)
MCHC: 33.2 g/dL (ref 30.0–36.0)
MCV: 92.9 fL (ref 78.0–100.0)
Monocytes Absolute: 1.4 10*3/uL — ABNORMAL HIGH (ref 0.1–1.0)
Monocytes Relative: 10 % (ref 3–12)
Neutro Abs: 8.9 10*3/uL — ABNORMAL HIGH (ref 1.7–7.7)
Neutrophils Relative %: 66 % (ref 43–77)
Platelets: 250 10*3/uL (ref 150–400)
RBC: 4.96 MIL/uL (ref 4.22–5.81)
RDW: 14.5 % (ref 11.5–15.5)
WBC: 13.5 10*3/uL — AB (ref 4.0–10.5)

## 2013-08-01 LAB — CBC
HEMATOCRIT: 43.2 % (ref 39.0–52.0)
HEMOGLOBIN: 14.2 g/dL (ref 13.0–17.0)
MCH: 30.9 pg (ref 26.0–34.0)
MCHC: 32.9 g/dL (ref 30.0–36.0)
MCV: 94.1 fL (ref 78.0–100.0)
Platelets: 235 10*3/uL (ref 150–400)
RBC: 4.59 MIL/uL (ref 4.22–5.81)
RDW: 14.6 % (ref 11.5–15.5)
WBC: 9.2 10*3/uL (ref 4.0–10.5)

## 2013-08-01 LAB — COMPREHENSIVE METABOLIC PANEL
ALBUMIN: 3.5 g/dL (ref 3.5–5.2)
ALT: 8 U/L (ref 0–53)
AST: 13 U/L (ref 0–37)
Alkaline Phosphatase: 82 U/L (ref 39–117)
BUN: 13 mg/dL (ref 6–23)
CALCIUM: 9 mg/dL (ref 8.4–10.5)
CO2: 25 meq/L (ref 19–32)
CREATININE: 1 mg/dL (ref 0.50–1.35)
Chloride: 104 mEq/L (ref 96–112)
GFR calc non Af Amer: 78 mL/min — ABNORMAL LOW (ref >90)
GFR, EST AFRICAN AMERICAN: 90 mL/min — AB (ref >90)
Glucose, Bld: 95 mg/dL (ref 70–99)
Potassium: 3.5 mEq/L — ABNORMAL LOW (ref 3.7–5.3)
SODIUM: 140 meq/L (ref 137–147)
TOTAL PROTEIN: 6.6 g/dL (ref 6.0–8.3)
Total Bilirubin: 0.6 mg/dL (ref 0.3–1.2)

## 2013-08-01 LAB — CREATININE, SERUM
CREATININE: 1.06 mg/dL (ref 0.50–1.35)
GFR calc Af Amer: 84 mL/min — ABNORMAL LOW (ref >90)
GFR calc non Af Amer: 72 mL/min — ABNORMAL LOW (ref >90)

## 2013-08-01 LAB — LIPASE, BLOOD: LIPASE: 33 U/L (ref 11–59)

## 2013-08-01 MED ORDER — ONDANSETRON HCL 4 MG/2ML IJ SOLN: 4.0000 mg | Freq: Three times a day (TID) | INTRAMUSCULAR | Status: AC | PRN

## 2013-08-01 MED ORDER — ATORVASTATIN CALCIUM 40 MG PO TABS
40.0000 mg | ORAL_TABLET | Freq: Every day | ORAL | Status: DC
  Administered 2013-08-01 – 2013-08-03 (×3): 40 mg via ORAL
  Filled 2013-08-01 (×3): qty 1

## 2013-08-01 MED ORDER — POTASSIUM CHLORIDE CRYS ER 20 MEQ PO TBCR
40.0000 meq | EXTENDED_RELEASE_TABLET | Freq: Once | ORAL | Status: AC
  Administered 2013-08-01: 40 meq via ORAL
  Filled 2013-08-01: qty 2

## 2013-08-01 MED ORDER — MORPHINE SULFATE 4 MG/ML IJ SOLN
4.0000 mg | Freq: Once | INTRAMUSCULAR | Status: AC
  Administered 2013-08-01: 4 mg via INTRAVENOUS
  Filled 2013-08-01: qty 1

## 2013-08-01 MED ORDER — SODIUM CHLORIDE 0.9 % IJ SOLN
3.0000 mL | Freq: Two times a day (BID) | INTRAMUSCULAR | Status: DC
  Administered 2013-08-02: 3 mL via INTRAVENOUS

## 2013-08-01 MED ORDER — IPRATROPIUM-ALBUTEROL 18-103 MCG/ACT IN AERO: 1.0000 | INHALATION_SPRAY | Freq: Every day | RESPIRATORY_TRACT | Status: DC

## 2013-08-01 MED ORDER — HYDROMORPHONE HCL PF 1 MG/ML IJ SOLN
1.0000 mg | Freq: Once | INTRAMUSCULAR | Status: AC
  Administered 2013-08-01: 1 mg via INTRAVENOUS
  Filled 2013-08-01: qty 1

## 2013-08-01 MED ORDER — IOHEXOL 300 MG/ML  SOLN
100.0000 mL | Freq: Once | INTRAMUSCULAR | Status: AC | PRN
  Administered 2013-08-01: 100 mL via INTRAVENOUS

## 2013-08-01 MED ORDER — HEPARIN SODIUM (PORCINE) 5000 UNIT/ML IJ SOLN
5000.0000 [IU] | Freq: Three times a day (TID) | INTRAMUSCULAR | Status: DC
  Administered 2013-08-01 – 2013-08-03 (×5): 5000 [IU] via SUBCUTANEOUS
  Filled 2013-08-01 (×10): qty 1

## 2013-08-01 MED ORDER — HYDROMORPHONE HCL PF 1 MG/ML IJ SOLN
1.0000 mg | INTRAMUSCULAR | Status: DC | PRN
  Administered 2013-08-01 – 2013-08-03 (×2): 1 mg via INTRAVENOUS
  Filled 2013-08-01 (×2): qty 1

## 2013-08-01 MED ORDER — OXYCODONE HCL 5 MG PO TABS
5.0000 mg | ORAL_TABLET | Freq: Four times a day (QID) | ORAL | Status: DC
  Administered 2013-08-01 – 2013-08-02 (×4): 5 mg via ORAL
  Filled 2013-08-01 (×4): qty 1

## 2013-08-01 MED ORDER — IOHEXOL 300 MG/ML  SOLN
50.0000 mL | Freq: Once | INTRAMUSCULAR | Status: AC | PRN
  Administered 2013-08-01: 50 mL via ORAL

## 2013-08-01 MED ORDER — SODIUM CHLORIDE 0.9 % IV BOLUS (SEPSIS)
1000.0000 mL | Freq: Once | INTRAVENOUS | Status: AC
  Administered 2013-08-01: 1000 mL via INTRAVENOUS

## 2013-08-01 MED ORDER — OXYBUTYNIN CHLORIDE 5 MG PO TABS
5.0000 mg | ORAL_TABLET | Freq: Three times a day (TID) | ORAL | Status: DC
  Administered 2013-08-01 – 2013-08-03 (×6): 5 mg via ORAL
  Filled 2013-08-01 (×9): qty 1

## 2013-08-01 MED ORDER — LEVETIRACETAM 500 MG PO TABS
500.0000 mg | ORAL_TABLET | Freq: Two times a day (BID) | ORAL | Status: DC
  Administered 2013-08-01 – 2013-08-03 (×4): 500 mg via ORAL
  Filled 2013-08-01 (×6): qty 1

## 2013-08-01 MED ORDER — ONDANSETRON HCL 4 MG/2ML IJ SOLN
4.0000 mg | Freq: Four times a day (QID) | INTRAMUSCULAR | Status: DC | PRN
  Administered 2013-08-02: 4 mg via INTRAVENOUS
  Filled 2013-08-01: qty 2

## 2013-08-01 MED ORDER — AMIODARONE HCL 200 MG PO TABS
200.0000 mg | ORAL_TABLET | Freq: Every day | ORAL | Status: DC
  Administered 2013-08-01 – 2013-08-03 (×3): 200 mg via ORAL
  Filled 2013-08-01 (×4): qty 1

## 2013-08-01 MED ORDER — OXYCODONE-ACETAMINOPHEN 5-325 MG PO TABS
2.0000 | ORAL_TABLET | Freq: Once | ORAL | Status: AC
Start: 2013-08-01 — End: 2013-08-01
  Administered 2013-08-01: 2 via ORAL
  Filled 2013-08-01: qty 2

## 2013-08-01 MED ORDER — ASPIRIN 81 MG PO CHEW
81.0000 mg | CHEWABLE_TABLET | Freq: Every day | ORAL | Status: DC
  Administered 2013-08-01 – 2013-08-03 (×3): 81 mg via ORAL
  Filled 2013-08-01 (×3): qty 1

## 2013-08-01 MED ORDER — DIPHENHYDRAMINE HCL 25 MG PO CAPS
50.0000 mg | ORAL_CAPSULE | Freq: Four times a day (QID) | ORAL | Status: DC | PRN
  Administered 2013-08-01 – 2013-08-03 (×4): 50 mg via ORAL
  Filled 2013-08-01 (×4): qty 2

## 2013-08-01 MED ORDER — DIVALPROEX SODIUM ER 500 MG PO TB24
500.0000 mg | ORAL_TABLET | Freq: Every day | ORAL | Status: DC
  Administered 2013-08-01 – 2013-08-03 (×3): 500 mg via ORAL
  Filled 2013-08-01 (×3): qty 1

## 2013-08-01 MED ORDER — CLINDAMYCIN HCL 300 MG PO CAPS
300.0000 mg | ORAL_CAPSULE | Freq: Once | ORAL | Status: AC
  Administered 2013-08-01: 300 mg via ORAL
  Filled 2013-08-01: qty 1

## 2013-08-01 MED ORDER — DEXTROSE-NACL 5-0.9 % IV SOLN
INTRAVENOUS | Status: DC
  Administered 2013-08-01 – 2013-08-02 (×2): via INTRAVENOUS

## 2013-08-01 MED ORDER — ONDANSETRON HCL 4 MG/2ML IJ SOLN
4.0000 mg | Freq: Once | INTRAMUSCULAR | Status: AC
  Administered 2013-08-01: 4 mg via INTRAVENOUS
  Filled 2013-08-01: qty 2

## 2013-08-01 MED ORDER — ONDANSETRON HCL 4 MG PO TABS: 4.0000 mg | ORAL_TABLET | Freq: Four times a day (QID) | ORAL | Status: DC | PRN

## 2013-08-01 MED ORDER — FENTANYL CITRATE 0.05 MG/ML IJ SOLN
50.0000 ug | Freq: Once | INTRAMUSCULAR | Status: AC
  Administered 2013-08-01: 50 ug via INTRAVENOUS
  Filled 2013-08-01: qty 2

## 2013-08-01 MED ORDER — DIPHENHYDRAMINE HCL 50 MG/ML IJ SOLN
25.0000 mg | Freq: Once | INTRAMUSCULAR | Status: AC
  Administered 2013-08-01: 25 mg via INTRAVENOUS

## 2013-08-01 MED ORDER — DIPHENHYDRAMINE HCL 25 MG PO CAPS
25.0000 mg | ORAL_CAPSULE | Freq: Once | ORAL | Status: DC
  Filled 2013-08-01: qty 1

## 2013-08-01 MED ORDER — CLINDAMYCIN HCL 150 MG PO CAPS: 300.0000 mg | ORAL_CAPSULE | Freq: Three times a day (TID) | ORAL | Status: DC

## 2013-08-01 MED ORDER — DIPHENHYDRAMINE HCL 25 MG PO CAPS
50.0000 mg | ORAL_CAPSULE | Freq: Once | ORAL | Status: AC
  Administered 2013-08-01: 50 mg via ORAL
  Filled 2013-08-01: qty 2

## 2013-08-01 MED ORDER — IPRATROPIUM-ALBUTEROL 0.5-2.5 (3) MG/3ML IN SOLN
3.0000 mL | Freq: Every day | RESPIRATORY_TRACT | Status: DC
  Administered 2013-08-01 – 2013-08-03 (×3): 3 mL via RESPIRATORY_TRACT
  Filled 2013-08-01 (×3): qty 3

## 2013-08-01 MED ORDER — DIPHENHYDRAMINE HCL 50 MG/ML IJ SOLN
50.0000 mg | Freq: Once | INTRAMUSCULAR | Status: DC
  Filled 2013-08-01: qty 1

## 2013-08-01 MED ORDER — PANTOPRAZOLE SODIUM 40 MG PO TBEC
40.0000 mg | DELAYED_RELEASE_TABLET | Freq: Every day | ORAL | Status: DC
  Administered 2013-08-01 – 2013-08-03 (×3): 40 mg via ORAL
  Filled 2013-08-01 (×3): qty 1

## 2013-08-01 MED ORDER — HYDROMORPHONE HCL PF 1 MG/ML IJ SOLN: 1.0000 mg | INTRAMUSCULAR | Status: DC | PRN

## 2013-08-01 MED ORDER — ALPRAZOLAM 0.5 MG PO TABS
1.0000 mg | ORAL_TABLET | Freq: Every evening | ORAL | Status: DC | PRN
  Administered 2013-08-02: 1 mg via ORAL
  Filled 2013-08-01: qty 2

## 2013-08-01 MED ORDER — CLINDAMYCIN PHOSPHATE 600 MG/50ML IV SOLN
600.0000 mg | Freq: Three times a day (TID) | INTRAVENOUS | Status: DC
  Administered 2013-08-01 – 2013-08-02 (×2): 600 mg via INTRAVENOUS
  Filled 2013-08-01 (×4): qty 50

## 2013-08-01 MED ORDER — DEXTROSE 5 % IV SOLN
1.0000 g | INTRAVENOUS | Status: DC
  Administered 2013-08-01: 1 g via INTRAVENOUS
  Filled 2013-08-01 (×2): qty 10

## 2013-08-01 MED ORDER — ROPINIROLE HCL 1 MG PO TABS
2.0000 mg | ORAL_TABLET | Freq: Three times a day (TID) | ORAL | Status: DC
  Administered 2013-08-01 – 2013-08-03 (×6): 2 mg via ORAL
  Filled 2013-08-01 (×9): qty 2

## 2013-08-01 NOTE — ED Notes (Signed)
Pt stated he wanted something for pain, and sleep. "I want to be knocked out, I have not been able to sleep".  Pt would also like something to eat.  I informed pt he is only allowed pain meds every 4hrs, and is currently NPO. EDRN was informed.

## 2013-08-01 NOTE — H&P (Signed)
[facility] Admission History and Physical Service Pager: [phone]  Patient name: Patrick Holt Medical record number: [id number] Date of birth: 03/31/1950 Age: 63 y.o. Gender: male  Primary Care [place] Magnan: Teresa Coombs, DO Consultants: None; Will consider discussing with patient's GI physician Code Status: Full code  Chief Complaint: Abdominal pain.  Assessment and Plan: [name] is a 63 y.o. male presenting with acute diverticulitis. PMH is significant for anxiety, COPD, Atrial fibrillation, CAD, HTN, HLD, Hx of Diverticulitis and Seizure disorder.  Patient also appears to have a history of chronic pain.  Acute Diverticulitis - Patient has had several office visits and admission earlier this year for diverticulitis. This appears to be a chronic problem for him with acute exacerbations intermittently.  Patient is followed by GI physician Dr. Maurene Capes.  Currently, patient appears to have mild diverticulitis of the sigmoid colon.   - Admit to Telemetry, Attending Dr. Mingo Amber - Antibiotics: Given patient's extensive allergies I discuss this with pharmacy (ID) recommend IV clindamycin and ceftriaxone.  Once patient clinically improves will plan on transition to PO Ceftin 500 mg BID and Clindamycin 300 mg QID. - Pain control: Scheduled Oxycodone 5 mg Q6, Dilaudid 1 mg Q2 PRN for breakthrough pain - Zofran PRN - IV fluids as below.  Patient adamant about eating so will start clear liquid diet.  COPD - No evidence of exacerbation currently.  - Continuing home Combivent  Atrial Fibrillation, CAD - Followed by cardiology.  Patient is not a candidate for anticoagulation given prior history of bleeding.  Continuing home amiodarone.  - Continuing home Aspirin 81 mg daily.  Anxiety - Continuing home Xanax  Seizure disorder - Continuing home Keppra and Depakote.   HTN - No treatment currently for this - Will monitor closely during admission  HLD -  Continuing home Lipitor   FEN/GI: Clear liquid diet; D5 NS @ 75 mL/hr Prophylaxis: Heparin  Disposition: IV antibiotics and pain control; pending clinical improvement.  History of Present Illness:  Patrick Holt is a 63 y.o. male with a complicated and extensive PMH which includes anxiety, COPD, Atrial fibrillation, CAD, HTN, HLD, Hx of Diverticulitis and Seizure disorder who presents with complaints of abdominal pain.  Patient with diverticulitis earlier this year, with admission 4/19 - 4/20.  He has recently been seen in the outpatient setting by Dr. Paulla Fore with continued pain and issues regarding diverticulitis.  This appears to be a chronic issue for him and he is followed by GI physician Dr. Maurene Capes.  Patient reports that last night around 9 to 10 PM he began to have worsening lower abdominal pain.  He reports associated nausea but denies any vomiting.  He also denies any hematochezia or melena over the past several days, but reports he had some blood in his stool approximately one week ago.  Given his worsening pain, he called EMS and was taken to [facility] long ED for evaluation.    In the ED, laboratory workup was obtained and was significant for a mild leukocytosis of 13.5 and mild hypokalemia of 3.5.   CT scan of the abdomen and pelvis was obtained and revealed uncomplicated acute diverticulosis of the sigmoid colon.  Patient was treated with IV antibiotics and receive 2 Percocet, 1 mg of Dilaudid, and 4 mg of morphine without significant improvement in pain.  Patient was then sent over for admission.   Review Of Systems: Per HPI with the following additions: No reports of chest pain, shortness of breath. Otherwise 12  point review of systems was performed and was unremarkable.  Patient Active Problem List   Diagnosis Date Noted  . Diverticulitis 08/01/2013  . Acute diverticulitis 08/01/2013  . Ankle pain, right 06/20/2013  . Hematochezia 06/04/2013  . Diverticulosis 06/04/2013  .  Pain in joint, lower leg 12/08/2012  . Dehydration 11/09/2012  . Contact dermatitis 11/09/2012  . Hypokalemia 10/03/2012  . Benign neoplasm of colon 09/27/2012  . Stricture and stenosis of esophagus 09/27/2012  . Diverticulitis of colon without hemorrhage 07/26/2012  . Polypharmacy 03/26/2012  . Thought disorder 23-Dec-202014    Class: Chronic  . 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 Psychotic disorder 02/14/2012  . Seizures 02/10/2012  . Atrial fibrillation 02/05/2012  . Coronary atherosclerosis of native coronary artery 02/05/2012  . High risk social situation 10/15/2011  . Dental attrition, excessive 03/10/2011  . Radicular low back pain 03/09/2011  . History of seizure disorder 01/29/2011  . Irritable bowel syndrome (IBS) 06/24/2010  . Urge incontinence 06/19/2010  . TOBACCO USER 11/23/2008  . DEPRESSION, MAJOR 05/31/2008  . ISCHEMIC HEART DISEASE 05/31/2008  . RAYNAUD'S DISEASE 05/31/2008  . HIATAL HERNIA 05/31/2008  . Rheumatoid arthritis(714.0) 05/31/2008  . FIBROMYALGIA 05/31/2008  . Generalized pain 05/31/2008  . DIABETES MELLITUS, TYPE II 01/26/2008  . HYPERLIPIDEMIA 01/26/2008  . ANXIETY, CHRONIC 01/26/2008  . HYPERTENSION 01/26/2008  . HEMORRHOIDS, INTERNAL 01/26/2008  . COPD 01/26/2008  . ESOPHAGEAL MOTILITY DISORDER 01/26/2008  . GERD 01/26/2008  . DIVERTICULOSIS OF COLON 01/26/2008  . Dermatomyositis 01/26/2008   Past Medical History: Past Medical History  Diagnosis Date  . Rheumatoid arthritis(714.0)   . Obesity   . Major depression     with acute psychotic break in 06/2010  . Hypertension   . Hyperlipidemia   . Diverticulosis of colon   . COPD (chronic obstructive pulmonary disease)   . Anxiety   . GERD (gastroesophageal reflux disease)   . Vertigo   . Fibromyalgia   . Dermatomyositis   . Myocardial infarct     mulitple (1999, 2000, 2004)  . Raynaud's disease   . Narcotic dependence   . Peripheral neuropathy   .  Internal hemorrhoids   . Ischemic heart disease   . Hiatal hernia   . Gastritis   . Diverticulitis   . Hx of adenomatous colonic polyps   . Nephrolithiasis   . Anemia   . Esophageal stricture   . Esophageal dysmotility   . Dermatomyositis   . Urge incontinence   . Otosclerosis   . Bipolar 1 disorder   . OCD (obsessive compulsive disorder)   . Sarcoidosis   . Paroxysmal a-fib   . Dysrhythmia     "irregular" (11/15/2012)  . Type II diabetes mellitus   . Seizures   . Headache(784.0)     "severe; get them often" (11/15/2012)  . Subarachnoid hemorrhage 01/2012    with subdural  hematoma.   . Atrial fibrillation 01/2012    with RVR  . Conversion disorder 06/2010  . History of narcotic addiction    Past Surgical History: Past Surgical History  Procedure Laterality Date  . Knee arthroscopy w/ meniscal repair Left 2009  . Lumbar disc surgery    . Squamous papilloma   2010    removed by Dr. Constance Holster ENT, noted on tongue  . Esophagogastroduodenoscopy N/A 09/27/2012    Procedure: ESOPHAGOGASTRODUODENOSCOPY (EGD);  Surgeon: Lafayette Dragon, MD;  Location: [facility];  Service: Endoscopy;  Laterality: N/A;  . Colonoscopy N/A  09/27/2012    Procedure: COLONOSCOPY;  Surgeon: Lafayette Dragon, MD;  Location: [facility];  Service: Endoscopy;  Laterality: N/A;  . Cataract extraction w/ intraocular lens implant Left   . Lymph node dissection Right 1970's    "neck; dr thought I had Hodgkins; turned out to be sarcoidosis" (11/15/2012)  . Tonsillectomy    . Carpal tunnel release Bilateral   . Cardiac catheterization     Social History: History  Substance Use Topics  . Smoking status: Current Every Day Smoker -- 0.50 packs/day for 30 years    Types: Cigarettes  . Smokeless tobacco: Never Used  . Alcohol Use: No     Comment: Alcohol stopped in September of 2014    Family History: Family History  Problem Relation Age of Onset  . Alcohol abuse Mother   . Depression Mother   . Heart disease  Mother   . Diabetes Mother   . Stroke Mother   . Diabetes Other     1/2 brother  . Hepatitis Brother    Allergies and Medications: Allergies  Allergen Reactions  . Betamethasone Dipropionate     Unknown  . Bupropion Hcl     Unknown  . Ciprofloxacin     REACTION: swelling  . Clobetasol     Unknown  . Codeine     Unknown  . Escitalopram Oxalate     Unknown  . Flagyl [Metronidazole]     REACTION: swelling  . Fluoxetine Hcl     Unknown  . Fluticasone-Salmeterol     Unknown  . Furosemide     Unknown  . Immune Globulins     Acute renal failure  . Paroxetine     Unknown  . Penicillins     Unknown  . Sulfa Antibiotics Other (See Comments)    blisters  . Tacrolimus     Unknown  . Tetanus Toxoid     Unknown  . Tuberculin Purified Protein Derivative     Unknown   No current facility-administered medications on file prior to encounter.   Current Outpatient Prescriptions on File Prior to Encounter  Medication Sig Dispense Refill  . albuterol-ipratropium (COMBIVENT) 18-103 MCG/ACT inhaler Inhale 1 puff into the lungs daily.  14.7 g  5  . amiodarone (PACERONE) 200 MG tablet Take 1 tablet (200 mg total) by mouth daily before breakfast.  90 tablet  1  . atorvastatin (LIPITOR) 40 MG tablet Take 1 tablet (40 mg total) by mouth daily.  90 tablet  1  . divalproex (DEPAKOTE ER) 500 MG 24 hr tablet Take 1 tablet (500 mg total) by mouth daily.  60 tablet  2  . esomeprazole (NEXIUM) 40 MG capsule Take 1 capsule (40 mg total) by mouth daily before breakfast.  90 capsule  1  . levETIRAcetam (KEPPRA) 500 MG tablet Take 500 mg by mouth 2 (two) times daily.       . Multiple Vitamin (MULTIVITAMIN WITH MINERALS) TABS tablet Take 1 tablet by mouth daily.  90 tablet  3  . oxybutynin (DITROPAN) 5 MG tablet Take 5 mg by mouth 3 (three) times daily.      . potassium chloride SA (K-DUR,KLOR-CON) 20 MEQ tablet Take 1 tablet (20 mEq total) by mouth daily.  90 tablet  1  . rOPINIRole (REQUIP) 2 MG  tablet Take 2 mg by mouth 3 (three) times daily.      . [DISCONTINUED] oxybutynin (DITROPAN) 5 MG tablet Take 1 tablet (5 mg total) by mouth 3 (three) times  daily.  90 tablet  3    Objective: BP 160/79  Pulse 78  Temp(Src) 98.6 F (37 C) (Oral)  Resp 18  SpO2 98% Exam: General: well appearing elderly gentleman, resting in bed, NAD.  HEENT: NCAT. Dry mucous membranes. Cardiovascular: RRR. No m/r/g. Respiratory: CTAB. No rales, rhonchi, or wheezing. Abdomen: soft, mildly tender in the RLQ and LLQ. No palpable organomegaly.  No rebound or guarding. Extremities: Chronic venous stasis changes noted. No LE edema.  Skin: warm, dry, intact. Neuro: No focal deficits on exam.  Patient perseverative regarding concern for colonoscopy.   Labs and Imaging: CBC BMET   Recent Labs Lab 08/01/13 0318  WBC 13.5*  HGB 15.3  HCT 46.1  PLT 250    Recent Labs Lab 08/01/13 0318  NA 140  K 3.5*  CL 104  CO2 25  BUN 13  CREATININE 1.00  GLUCOSE 95  CALCIUM 9.0     Ct Abdomen Pelvis W Contrast 08/01/2013    IMPRESSION: Acute non complicated diverticulitis of the mid sigmoid colon.   Coral Spikes, DO 08/01/2013, 3:24 PM PGY-2, [place] [facility] pager: [phone], text pages welcome

## 2013-08-01 NOTE — Discharge Instructions (Signed)

## 2013-08-01 NOTE — ED Notes (Signed)
Bed: XV40 Expected date:  Expected time:  Means of arrival:  Comments: EMS 64yo M

## 2013-08-01 NOTE — Progress Notes (Signed)
08/01/13 1600  Clinical Encounter Type  Visited With Patient  Visit Type Initial;Spiritual support  Spiritual Encounters  Spiritual Needs Emotional  Stress Factors  Patient Stress Factors Health changes  Family Stress Factors None identified  Pt stated that he felt "isolated and depressed" because he doesn't "have much human contact." We explored his feelings and concern for community. Chaplain offered spiritual and emotional support.  Delford Field

## 2013-08-01 NOTE — ED Notes (Signed)
Per PTAR: Pt c/o bilateral lower abdominal pain. Nausea but no V/D. Pt reports feeling constipated. Pt was at hospital yesterday for spider bite. Hx of diverticulitis, fibromyalgia. Pt ambulatory with cane at baseline, reports stomach hurts worse with walking. A&O x 4.

## 2013-08-01 NOTE — ED Provider Notes (Signed)
CSN: [id number]     Arrival date & time 08/01/13  0243 History   First MD Initiated Contact with Patient 08/01/13 0250     Chief Complaint  Patient presents with  . Abdominal Pain   HPI  History provided by the patient and medical charts. Patient is a 63 year old male with multiple medical problems including hypertension, hyperlipidemia, COPD, diverticulosis, fibromyalgia, narcotic dependence who presents with complaints of lower abdominal pain. Patient reports having some off and on chronic problems of lower abdominal and diffuse abdominal pains for many months. He states his symptoms were recently improved and his PCP gave him a short prescription of oxycodone.  around 10 PM the patient began having sharp worsening pains. This has persisted through the early morning today. He did not use any medications or treatment for his symptoms. Denies any other associated symptoms. No nausea, vomiting, diarrhea or constipation. No fever, chills or sweats.   Past Medical History  Diagnosis Date  . Rheumatoid arthritis(714.0)   . Obesity   . Major depression     with acute psychotic break in 06/2010  . Hypertension   . Hyperlipidemia   . Diverticulosis of colon   . COPD (chronic obstructive pulmonary disease)   . Anxiety   . GERD (gastroesophageal reflux disease)   . Vertigo   . Fibromyalgia   . Dermatomyositis   . Myocardial infarct     mulitple (1999, 2000, 2004)  . Raynaud's disease   . Narcotic dependence   . Peripheral neuropathy   . Internal hemorrhoids   . Ischemic heart disease   . Hiatal hernia   . Gastritis   . Diverticulitis   . Hx of adenomatous colonic polyps   . Nephrolithiasis   . Anemia   . Esophageal stricture   . Esophageal dysmotility   . Dermatomyositis   . Urge incontinence   . Otosclerosis   . Bipolar 1 disorder   . OCD (obsessive compulsive disorder)   . Sarcoidosis   . Paroxysmal a-fib   . Dysrhythmia     "irregular" (11/15/2012)  . Type II diabetes  mellitus   . Seizures   . Headache(784.0)     "severe; get them often" (11/15/2012)  . Subarachnoid hemorrhage 01/2012    with subdural  hematoma.   . Atrial fibrillation 01/2012    with RVR  . Conversion disorder 06/2010  . History of narcotic addiction    Past Surgical History  Procedure Laterality Date  . Knee arthroscopy w/ meniscal repair Left 2009  . Lumbar disc surgery    . Squamous papilloma   2010    removed by Dr. Constance Holster ENT, noted on tongue  . Esophagogastroduodenoscopy N/A 09/27/2012    Procedure: ESOPHAGOGASTRODUODENOSCOPY (EGD);  Surgeon: Lafayette Dragon, MD;  Location: [facility];  Service: Endoscopy;  Laterality: N/A;  . Colonoscopy N/A 09/27/2012    Procedure: COLONOSCOPY;  Surgeon: Lafayette Dragon, MD;  Location: [facility];  Service: Endoscopy;  Laterality: N/A;  . Cataract extraction w/ intraocular lens implant Left   . Lymph node dissection Right 1970's    "neck; dr thought I had Hodgkins; turned out to be sarcoidosis" (11/15/2012)  . Tonsillectomy    . Carpal tunnel release Bilateral   . Cardiac catheterization     Family History  Problem Relation Age of Onset  . Alcohol abuse Mother   . Depression Mother   . Heart disease Mother   . Diabetes Mother   . Stroke Mother   . Diabetes Other  1/2 brother  . Hepatitis Brother    History  Substance Use Topics  . Smoking status: Current Every Day Smoker -- 0.50 packs/day for 30 years    Types: Cigarettes  . Smokeless tobacco: Never Used  . Alcohol Use: No     Comment: Alcohol stopped in September of 2014    Review of Systems  Constitutional: Negative for fever, chills and diaphoresis.  Gastrointestinal: Positive for abdominal pain. Negative for nausea, vomiting, diarrhea, constipation and blood in stool.  Genitourinary: Negative for dysuria, frequency, hematuria and flank pain.  All other systems reviewed and are negative.     Allergies  Betamethasone dipropionate; Bupropion hcl; Ciprofloxacin;  Clobetasol; Codeine; Escitalopram oxalate; Flagyl; Fluoxetine hcl; Fluticasone-salmeterol; Furosemide; Immune globulins; Paroxetine; Penicillins; Sulfa antibiotics; Tacrolimus; Tetanus toxoid; and Tuberculin purified protein derivative  Home Medications   Prior to Admission medications   Medication Sig Start Date End Date Taking? Authorizing Provider  albuterol-ipratropium (COMBIVENT) 18-103 MCG/ACT inhaler Inhale 1 puff into the lungs daily. 06/20/13   Gerda Diss, DO  ALPRAZolam Duanne Moron) 1 MG tablet Take 1 tablet (1 mg total) by mouth daily. 06/20/13   Gerda Diss, DO  amiodarone (PACERONE) 200 MG tablet Take 1 tablet (200 mg total) by mouth daily before breakfast. 06/20/13   Gerda Diss, DO  aspirin EC 81 MG EC tablet Take 1 tablet (81 mg total) by mouth daily. 10/20/12   Melony Overly, MD  atorvastatin (LIPITOR) 40 MG tablet Take 1 tablet (40 mg total) by mouth daily. 03/26/13   Gerda Diss, DO  cephALEXin (KEFLEX) 500 MG capsule Take 1 capsule (500 mg total) by mouth 4 (four) times daily. 07/30/13   Hannah Muthersbaugh, PA-C  divalproex (DEPAKOTE ER) 500 MG 24 hr tablet Take 1 tablet (500 mg total) by mouth daily. 06/20/13   Gerda Diss, DO  esomeprazole (NEXIUM) 40 MG capsule Take 1 capsule (40 mg total) by mouth daily before breakfast. 06/11/13   Gerda Diss, DO  levETIRAcetam (KEPPRA) 500 MG tablet Take 500 mg by mouth daily. 01/10/13   Gerda Diss, DO  Multiple Vitamin (MULTIVITAMIN WITH MINERALS) TABS tablet Take 1 tablet by mouth daily. 12/29/12   Kandis Nab, MD  naproxen (NAPROSYN) 500 MG tablet Take 1 tablet (500 mg total) by mouth 2 (two) times daily with a meal. 06/11/13   Johnna Acosta, MD  oxybutynin (DITROPAN) 5 MG tablet Take 5 mg by mouth 3 (three) times daily. 01/10/13   Gerda Diss, DO  oxyCODONE-acetaminophen (ROXICET) 5-325 MG per tablet Take 1 tablet by mouth every 8 (eight) hours as needed for severe pain. 06/20/13   Gerda Diss, DO  potassium  chloride SA (K-DUR,KLOR-CON) 20 MEQ tablet Take 1 tablet (20 mEq total) by mouth daily. 04/02/13   Gerda Diss, DO  rOPINIRole (REQUIP) 2 MG tablet Take 2 mg by mouth 3 (three) times daily.    Historical Provider, MD  torsemide (DEMADEX) 20 MG tablet Take 1 tablet (20 mg total) by mouth daily as needed. 06/20/13   Gerda Diss, DO   BP 156/96  Pulse 81  Temp(Src) 98.7 F (37.1 C) (Oral)  Resp 18  SpO2 94% Physical Exam  Nursing note and vitals reviewed. Constitutional: He is oriented to person, place, and time. He appears well-developed and well-nourished. No distress.  HENT:  Head: Normocephalic.  Cardiovascular: Normal rate and regular rhythm.   Pulmonary/Chest: Effort normal and breath sounds normal. No respiratory distress. He has  no wheezes. He has no rales.  Abdominal: Soft. He exhibits no distension. There is tenderness. There is guarding. There is no rebound.  Patient complains of significant pains throughout the abdomen. Even to light palpation.  Neurological: He is alert and oriented to person, place, and time.  Skin: Skin is warm.  Psychiatric: He has a normal mood and affect. His behavior is normal.    ED Course  Procedures   COORDINATION OF CARE:  Nursing notes reviewed. Vital signs reviewed. Initial pt interview and examination performed.   Filed Vitals:   08/01/13 0245 08/01/13 0246  BP: 156/96   Pulse: 81   Temp: 98.7 F (37.1 C)   TempSrc: Oral   Resp: 18   SpO2: 96% 94%    3:02 AM-patient seen and evaluated. Patient appears well does not appear in severe pain or discomfort. He is afebrile with unremarkable vital signs. Does have history of similar ongoing pains. These are acutely worsened last night and early this morning.  Pt only reports slight improvement with Percocet.  Pt does have elevated WBC with history of diverticulitis. He is tender in the lower abdomen. Patient discussed with attending physician. We'll order CT scan despite multiple  scans in the past.   Pt discussed in sign out with PALMER, JESSICA K PA-C.  She will follow CT results.   Treatment plan initiated: Medications  sodium chloride 0.9 % bolus 1,000 mL (not administered)  morphine 4 MG/ML injection 4 mg (not administered)  ondansetron (ZOFRAN) injection 4 mg (not administered)  oxyCODONE-acetaminophen (PERCOCET/ROXICET) 5-325 MG per tablet 2 tablet (2 tablets Oral Given 08/01/13 0315)  potassium chloride SA (K-DUR,KLOR-CON) CR tablet 40 mEq (40 mEq Oral Given 08/01/13 0445)    Results for orders placed during the hospital encounter of 08/01/13  CBC WITH DIFFERENTIAL      Result Value Ref Range   WBC 13.5 (*) 4.0 - 10.5 K/uL   RBC 4.96  4.22 - 5.81 MIL/uL   Hemoglobin 15.3  13.0 - 17.0 g/dL   HCT 46.1  39.0 - 52.0 %   MCV 92.9  78.0 - 100.0 fL   MCH 30.8  26.0 - 34.0 pg   MCHC 33.2  30.0 - 36.0 g/dL   RDW 14.5  11.5 - 15.5 %   Platelets 250  150 - 400 K/uL   Neutrophils Relative % 66  43 - 77 %   Neutro Abs 8.9 (*) 1.7 - 7.7 K/uL   Lymphocytes Relative 22  12 - 46 %   Lymphs Abs 3.0  0.7 - 4.0 K/uL   Monocytes Relative 10  3 - 12 %   Monocytes Absolute 1.4 (*) 0.1 - 1.0 K/uL   Eosinophils Relative 2  0 - 5 %   Eosinophils Absolute 0.3  0.0 - 0.7 K/uL   Basophils Relative 0  0 - 1 %   Basophils Absolute 0.0  0.0 - 0.1 K/uL  COMPREHENSIVE METABOLIC PANEL      Result Value Ref Range   Sodium 140  137 - 147 mEq/L   Potassium 3.5 (*) 3.7 - 5.3 mEq/L   Chloride 104  96 - 112 mEq/L   CO2 25  19 - 32 mEq/L   Glucose, Bld 95  70 - 99 mg/dL   BUN 13  6 - 23 mg/dL   Creatinine, Ser 1.00  0.50 - 1.35 mg/dL   Calcium 9.0  8.4 - 10.5 mg/dL   Total Protein 6.6  6.0 - 8.3 g/dL  Albumin 3.5  3.5 - 5.2 g/dL   AST 13  0 - 37 U/L   ALT 8  0 - 53 U/L   Alkaline Phosphatase 82  39 - 117 U/L   Total Bilirubin 0.6  0.3 - 1.2 mg/dL   GFR calc non Af Amer 78 (*) >90 mL/min   GFR calc Af Amer 90 (*) >90 mL/min  LIPASE, BLOOD      Result Value Ref Range    Lipase 33  11 - 59 U/L     Imaging Review No results found.   EKG Interpretation None      MDM   Final diagnoses:  Abdominal pain       Martie Lee, PA-C 08/01/13 0602

## 2013-08-01 NOTE — ED Provider Notes (Signed)
6:00 AM = Received sign-out from [facility]. Await results of CT scan.   Results for orders placed during the hospital encounter of 08/01/13  CBC WITH DIFFERENTIAL      Result Value Ref Range   WBC 13.5 (*) 4.0 - 10.5 K/uL   RBC 4.96  4.22 - 5.81 MIL/uL   Hemoglobin 15.3  13.0 - 17.0 g/dL   HCT 46.1  39.0 - 52.0 %   MCV 92.9  78.0 - 100.0 fL   MCH 30.8  26.0 - 34.0 pg   MCHC 33.2  30.0 - 36.0 g/dL   RDW 14.5  11.5 - 15.5 %   Platelets 250  150 - 400 K/uL   Neutrophils Relative % 66  43 - 77 %   Neutro Abs 8.9 (*) 1.7 - 7.7 K/uL   Lymphocytes Relative 22  12 - 46 %   Lymphs Abs 3.0  0.7 - 4.0 K/uL   Monocytes Relative 10  3 - 12 %   Monocytes Absolute 1.4 (*) 0.1 - 1.0 K/uL   Eosinophils Relative 2  0 - 5 %   Eosinophils Absolute 0.3  0.0 - 0.7 K/uL   Basophils Relative 0  0 - 1 %   Basophils Absolute 0.0  0.0 - 0.1 K/uL  COMPREHENSIVE METABOLIC PANEL      Result Value Ref Range   Sodium 140  137 - 147 mEq/L   Potassium 3.5 (*) 3.7 - 5.3 mEq/L   Chloride 104  96 - 112 mEq/L   CO2 25  19 - 32 mEq/L   Glucose, Bld 95  70 - 99 mg/dL   BUN 13  6 - 23 mg/dL   Creatinine, Ser 1.00  0.50 - 1.35 mg/dL   Calcium 9.0  8.4 - 10.5 mg/dL   Total Protein 6.6  6.0 - 8.3 g/dL   Albumin 3.5  3.5 - 5.2 g/dL   AST 13  0 - 37 U/L   ALT 8  0 - 53 U/L   Alkaline Phosphatase 82  39 - 117 U/L   Total Bilirubin 0.6  0.3 - 1.2 mg/dL   GFR calc non Af Amer 78 (*) >90 mL/min   GFR calc Af Amer 90 (*) >90 mL/min  LIPASE, BLOOD      Result Value Ref Range   Lipase 33  11 - 59 U/L   Filed Vitals:   08/01/13 0906 08/01/13 0931 08/01/13 0941 08/01/13 1059  BP:  101/81  143/89  Pulse: 67 61 77 78  Temp:      TempSrc:      Resp:    19  SpO2: 96%   99%     CT Abdomen Pelvis W Contrast (Final result)  Result time: 08/01/13 07:32:04    Final result by Rad Results In Interface (08/01/13 07:32:04)    Narrative:   CLINICAL DATA: Constipation, lower abdominal pain. Elevated white cell  count  EXAM: CT ABDOMEN AND PELVIS WITH CONTRAST  TECHNIQUE: Multidetector CT imaging of the abdomen and pelvis was performed using the standard protocol following bolus administration of intravenous contrast.  CONTRAST: 128mL OMNIPAQUE IOHEXOL 300 MG/ML SOLN  COMPARISON: CT 06/04/2013  FINDINGS: Lung bases demonstrate mild atelectasis. No pericardial fluid.  No focal hepatic lesion. The gallbladder, pancreas,, spleen, adrenal glands are normal. There are low-density lesions within the kidneys which are unchanged and most consistent with benign cysts.  The stomach, small bowel, appendix, cecum are normal. In the mid sigmoid colon there is pericolonic  inflammation in a region of multiple diverticula. Findings are consistent with acute diverticulitis. There is no evidence of abscess formation or macro perforation. No evidence of colonic mass. The rectum is normal.  Abdominal aorta is heavily calcified. No retroperitoneal periportal lymphadenopathy.  No free fluid the pelvis. The prostate gland and bladder normal. No pelvic lymphadenopathy. No aggressive osseous lesion.  IMPRESSION: Acute non complicated diverticulitis of the mid sigmoid colon.   Electronically Signed By: Suzy Bouchard M.D. On: 08/01/2013 07:32     Rechecks  8:15 AM = Patient states pain is a 10/10. Has had no relief in his symptoms. Abdominal exam benign with tenderness in the LLQ. Ordering 1 mg dilaudid. Has appointment with GI specialist Dr. Maurene Capes in two days.   10:22 AM = Pain uncontrolled. LLQ tenderness.     Patient admitted for uncontrolled pain from diverticulitis. Patient received oxycodone, morphine, and dilaudid with no improvements in his pain. Has hx of diverticulitis in the past. Vital signs stable. Has mile leukocytosis (13.5). Also mild hypokalemia (3.5) which was supplemented in the ED. Patient transferred to [facility] for [facility].    Final impressions: 1.  Diverticulitis   2. Hypokalemia      Mercy Moore PA-C   This patient was discussed with Dr. Leia Alf, PA-C 08/01/13 1110

## 2013-08-01 NOTE — H&P (Signed)
FMTS Attending Admission Note: Annabell Sabal MD Personal pager:  [phone] FPTS Service Pager:  [phone]  I  have seen and examined this patient, reviewed their chart. I have discussed this patient with the resident. I agree with the resident's findings, assessment and care plan.  Additionally:  63 yo M very well known to me who presents with what sounds like 2-3 days acute on chronic abdominal pain.  Describes as sharp stabbing BL LQ's.  No change in bowel habits.  Pain is "50 out of 1-10 scale."  Mild nausea but able to tolerate PO intake.  Came to ED and found to have diverticulitis. Exam reveals elderly male, well-groomed, sitting on edge of bed, in no distress whatsoever.  Lungs clear, abdomen with tenderness LLQ only to very deep palpation.  Talking through most of my abdominal exam without any evidence of tenderness.  Plan to start clinda + CTX due to patient's allergies.  Starting liquid diet tonight.    Alveda Reasons, MD 08/01/2013 5:04 PM

## 2013-08-02 LAB — COMPREHENSIVE METABOLIC PANEL
ALK PHOS: 76 U/L (ref 39–117)
ALT: 8 U/L (ref 0–53)
AST: 15 U/L (ref 0–37)
Albumin: 3.3 g/dL — ABNORMAL LOW (ref 3.5–5.2)
BUN: 10 mg/dL (ref 6–23)
CO2: 26 meq/L (ref 19–32)
Calcium: 8.9 mg/dL (ref 8.4–10.5)
Chloride: 105 mEq/L (ref 96–112)
Creatinine, Ser: 1.01 mg/dL (ref 0.50–1.35)
GFR calc non Af Amer: 77 mL/min — ABNORMAL LOW (ref >90)
GFR, EST AFRICAN AMERICAN: 89 mL/min — AB (ref >90)
Glucose, Bld: 89 mg/dL (ref 70–99)
POTASSIUM: 4.3 meq/L (ref 3.7–5.3)
SODIUM: 142 meq/L (ref 137–147)
TOTAL PROTEIN: 6.3 g/dL (ref 6.0–8.3)
Total Bilirubin: 0.6 mg/dL (ref 0.3–1.2)

## 2013-08-02 LAB — CBC
HCT: 39.9 % (ref 39.0–52.0)
Hemoglobin: 12.7 g/dL — ABNORMAL LOW (ref 13.0–17.0)
MCH: 30.5 pg (ref 26.0–34.0)
MCHC: 31.8 g/dL (ref 30.0–36.0)
MCV: 95.7 fL (ref 78.0–100.0)
PLATELETS: 215 10*3/uL (ref 150–400)
RBC: 4.17 MIL/uL — ABNORMAL LOW (ref 4.22–5.81)
RDW: 15 % (ref 11.5–15.5)
WBC: 6.9 10*3/uL (ref 4.0–10.5)

## 2013-08-02 MED ORDER — CLINDAMYCIN HCL 300 MG PO CAPS
300.0000 mg | ORAL_CAPSULE | Freq: Four times a day (QID) | ORAL | Status: DC
  Administered 2013-08-02 – 2013-08-03 (×4): 300 mg via ORAL
  Filled 2013-08-02 (×7): qty 1

## 2013-08-02 MED ORDER — OXYCODONE HCL 5 MG PO TABS
10.0000 mg | ORAL_TABLET | Freq: Four times a day (QID) | ORAL | Status: DC
  Administered 2013-08-02 – 2013-08-03 (×4): 10 mg via ORAL
  Filled 2013-08-02 (×4): qty 2

## 2013-08-02 MED ORDER — CEFUROXIME AXETIL 500 MG PO TABS
500.0000 mg | ORAL_TABLET | Freq: Two times a day (BID) | ORAL | Status: DC
  Administered 2013-08-02 – 2013-08-03 (×2): 500 mg via ORAL
  Filled 2013-08-02 (×4): qty 1

## 2013-08-02 MED ORDER — HYDROXYZINE HCL 25 MG PO TABS
25.0000 mg | ORAL_TABLET | Freq: Three times a day (TID) | ORAL | Status: DC | PRN
  Administered 2013-08-02 – 2013-08-03 (×2): 25 mg via ORAL
  Filled 2013-08-02 (×2): qty 1

## 2013-08-02 NOTE — Progress Notes (Signed)
UR COMPLETED  

## 2013-08-02 NOTE — Progress Notes (Signed)
Family Medicine Teaching Service Daily Progress Note Intern Pager: [phone]  Patient name: Patrick Holt Medical record number: [id number] Date of birth: 1950-11-10 Age: 63 y.o. Gender: male  Primary Care Provider: Teresa Coombs, DO Consultants: None Code Status: Full  Pt Overview and Major Events to Date:  6/17: Admitted with mild-moderate acute diverticulitis  Assessment and Plan: VENKAT ANKNEY is a 62 y.o. male presenting with acute diverticulitis. PMH is significant for anxiety, COPD, Atrial fibrillation, CAD, HTN, HLD, Hx of Diverticulitis and Seizure disorder. Patient also appears to have a history of chronic pain.   Acute Diverticulitis: Mild - Patient has had several office visits and admission earlier this year for diverticulitis. This appears to be a chronic problem for him with acute exacerbations intermittently. Currently, patient appears to have mild diverticulitis of the sigmoid colon.  - Tolerating diet: IV clindamycin and ceftriaxone transitioned to ceftin 500 mg po BID, clindamycin 300 mg po q6h  - Oxycodone 10mg  q6h, dilaudid 1 mgq2h prn breakthrough pain  - Advancing diet as tolerated: Thickened liquids - Zofran PRN   - Gastroenterologist Dr. Olevia Perches not in the office today; will call tomorrow to make her aware. Cancelled his appointment for tomorrow.    COPD  - Continue combivent   Atrial fibrillation, CAD  - Followed by cardiology. Patient is not a candidate for anticoagulation given prior history of bleeding.  - Rate controlled: continue amiodarone.  - Continue aspirin 81 mg daily.   Anxiety  - Continue xanax   Seizure disorder  - Continue keppra and depakote.   HTN  - Normotensive without medications  HLD  - Continue lipitor   FEN/GI: Thickened liquid diet; saline lock IV Prophylaxis: Subcutaneous heparin  Disposition: Advance diet as tolerated today, provide pain control. Switching to po antibiotics today. Possible D/C tomorrow pending  improvement.   Subjective:  Pt reports severe pain in his lower abdomen bilaterally.   Objective: Temp:  [98.2 F (36.8 C)-98.9 F (37.2 C)] 98.9 F (37.2 C) (06/18 0604) Pulse Rate:  [60-78] 60 (06/18 0604) Resp:  [18-20] 20 (06/18 0604) BP: (117-160)/(67-81) 121/74 mmHg (06/18 0604) SpO2:  [92 %-98 %] 92 % (06/18 0604) Physical Exam: General: Well-appearing 63 y.o. male in NAD Cardiovascular: Regular rate, no murmur or JVD Respiratory: Non-labored, CTAB Abdomen: +BS, soft, mildly tender in L > R lower quadrants. No rebound or guarding. Extremities: No edema. [facility]  Laboratory:  Recent Labs Lab 08/01/13 0318 08/01/13 1542 08/02/13 0515  WBC 13.5* 9.2 6.9  HGB 15.3 14.2 12.7*  HCT 46.1 43.2 39.9  PLT 250 235 215    Recent Labs Lab 08/01/13 0318 08/01/13 1542 08/02/13 0515  NA 140  --  142  K 3.5*  --  4.3  CL 104  --  105  CO2 25  --  26  BUN 13  --  10  CREATININE 1.00 1.06 1.01  CALCIUM 9.0  --  8.9  PROT 6.6  --  6.3  BILITOT 0.6  --  0.6  ALKPHOS 82  --  76  ALT 8  --  8  AST 13  --  15  GLUCOSE 95  --  89   Ct Abdomen Pelvis W Contrast  08/01/2013 IMPRESSION: Acute non complicated diverticulitis of the mid sigmoid colon.   Vance Gather, MD 08/02/2013, 12:40 PM PGY-1, [place] Intern pager: [phone], text pages welcome

## 2013-08-02 NOTE — Progress Notes (Signed)
FMTS Attending Daily Note:  Annabell Sabal MD  [phone] pager  Family Practice pager:  [phone] I have seen and examined this patient and have reviewed their chart. I have discussed this patient with the resident. I agree with the resident's findings, assessment and care plan.   Alveda Reasons, MD 08/02/2013 4:32 PM

## 2013-08-03 ENCOUNTER — Ambulatory Visit: Payer: Self-pay | Admitting: Internal Medicine

## 2013-08-03 DIAGNOSIS — I259 Chronic ischemic heart disease, unspecified: Secondary | ICD-10-CM

## 2013-08-03 DIAGNOSIS — J449 Chronic obstructive pulmonary disease, unspecified: Secondary | ICD-10-CM

## 2013-08-03 MED ORDER — CEFUROXIME AXETIL 500 MG PO TABS: 500.0000 mg | ORAL_TABLET | Freq: Two times a day (BID) | ORAL | Status: DC

## 2013-08-03 MED ORDER — HYDROMORPHONE HCL 2 MG PO TABS: 2.0000 mg | ORAL_TABLET | ORAL | Status: DC | PRN

## 2013-08-03 MED ORDER — CLINDAMYCIN HCL 300 MG PO CAPS: 300.0000 mg | ORAL_CAPSULE | Freq: Four times a day (QID) | ORAL | Status: DC

## 2013-08-03 MED ORDER — HYDROXYZINE HCL 25 MG PO TABS: 25.0000 mg | ORAL_TABLET | Freq: Three times a day (TID) | ORAL | Status: DC | PRN

## 2013-08-03 NOTE — Progress Notes (Signed)
Family Medicine Teaching Service Daily Progress Note Intern Pager: [phone]  Patient name: Patrick Holt Medical record number: [id number] Date of birth: 09/19/1950 Age: 63 y.o. Gender: male  Primary Care Jiraiya Mcewan: Teresa Coombs, DO Consultants: None Code Status: Full  Pt Overview and Major Events to Date:  6/17: Admitted with mild-moderate acute diverticulitis; IV antibiotics started 6/18: Advanced diet, antibiotics transitioned to po  Assessment and Plan: Patrick Holt is a 63 y.o. male presenting with acute diverticulitis. PMH is significant for anxiety, COPD, Atrial fibrillation, CAD, HTN, HLD, Hx of Diverticulitis and Seizure disorder. Patient also appears to have a history of chronic pain.   Acute uncomplicated diverticulitis: Mild: With pain and CT findings of sigmoid diverticulitis - Tolerating soft diet and po antibiotics: ceftin 500 mg po BID, clindamycin 300 mg po q6h  - Oxycodone 10mg  q6h, dilaudid 1 mg q2h prn breakthrough pain  - Advancing diet as tolerated - Zofran PRN   - Gastroenterologist Dr. Olevia Perches to be called this morning  COPD  - Continue combivent  Atrial fibrillation, CAD  - Followed by cardiology. Patient is not a candidate for anticoagulation given prior history of bleeding.  - Rate controlled: continue amiodarone.  - Continue aspirin 81 mg daily.  Anxiety  - Continue xanax  Seizure disorder  - Continue keppra and depakote.  HTN  - Normotensive without medications HLD  - Continue lipitor   FEN/GI: Thickened liquid diet; saline lock IV Prophylaxis: Subcutaneous heparin  Disposition: D/C today given tolerance of po.  Subjective:  Pt reports severe pain in his lower abdomen bilaterally that was helped much more by dilaudid than oxyIR. No BM. Still worried about obtaining colonoscopy. Dr. Olevia Perches called, forwarded to PA who reported she would schedule follow up.   Objective: Temp:  [97.9 F (36.6 C)-98.2 F (36.8 C)] 98.2 F (36.8 C)  (06/19 0548) Pulse Rate:  [66-70] 66 (06/19 0548) Resp:  [18-19] 19 (06/19 0548) BP: (121-135)/(76-89) 135/88 mmHg (06/19 0548) SpO2:  [91 %-96 %] 91 % (06/19 0548) Physical Exam: General: Well-appearing 63 y.o. male in NAD Cardiovascular: Regular rate, no murmur or JVD Respiratory: Non-labored, CTAB Abdomen: +BS, soft, mildly tender in L > R lower quadrants. No rebound or guarding. Extremities: No edema. [facility]  Laboratory:  Recent Labs Lab 08/01/13 0318 08/01/13 1542 08/02/13 0515  WBC 13.5* 9.2 6.9  HGB 15.3 14.2 12.7*  HCT 46.1 43.2 39.9  PLT 250 235 215    Recent Labs Lab 08/01/13 0318 08/01/13 1542 08/02/13 0515  NA 140  --  142  K 3.5*  --  4.3  CL 104  --  105  CO2 25  --  26  BUN 13  --  10  CREATININE 1.00 1.06 1.01  CALCIUM 9.0  --  8.9  PROT 6.6  --  6.3  BILITOT 0.6  --  0.6  ALKPHOS 82  --  76  ALT 8  --  8  AST 13  --  15  GLUCOSE 95  --  89   Ct Abdomen Pelvis W Contrast  08/01/2013 IMPRESSION: Acute non complicated diverticulitis of the mid sigmoid colon.   Vance Gather, MD 08/03/2013, 8:37 AM PGY-1, [place] Intern pager: [phone], text pages welcome

## 2013-08-03 NOTE — Progress Notes (Signed)
FMTS Attending Daily Note:  Annabell Sabal MD  [phone] pager  Family Practice pager:  [phone] I have discussed this patient with the resident Dr. Bonner Puna.  I agree with their findings, assessment, and care plan

## 2013-08-03 NOTE — ED Provider Notes (Signed)
Medical screening examination/treatment/procedure(s) were conducted as a shared visit with non-physician practitioner(s) and myself.  I personally evaluated the patient during the encounter.   EKG Interpretation None      Patient here with diverticulitis - multiple bouts in the past - having severe LLQ pain - admitted to [facility].  Osvaldo Shipper, MD 08/03/13 [phone]

## 2013-08-03 NOTE — Discharge Summary (Signed)
[facility] Discharge Summary  Patient name: Patrick Holt Medical record number: [id number] Date of birth: October 31, 1950 Age: 62 y.o. Gender: male Date of Admission: 08/01/2013  Date of Discharge: 08/03/2013 Admitting Physician: Alveda Reasons, MD  Primary Care Provider: Teresa Coombs, DO Consultants: None  Indication for Hospitalization: Mild diverticulitis  Discharge Diagnoses/Problem List:  Patient Active Problem List   Diagnosis Date Noted  . Diverticulitis 08/01/2013  . Acute diverticulitis 08/01/2013  . Ankle pain, right 06/20/2013  . Hematochezia 06/04/2013  . Diverticulosis 06/04/2013  . Pain in joint, lower leg 12/08/2012  . Dehydration 11/09/2012  . Contact dermatitis 11/09/2012  . Hypokalemia 10/03/2012  . Benign neoplasm of colon 09/27/2012  . Stricture and stenosis of esophagus 09/27/2012  . Diverticulitis of colon without hemorrhage 07/26/2012  . Polypharmacy 03/26/2012  . Thought disorder 16-Jan-202014    Class: Chronic  . 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 Psychotic disorder 02/14/2012  . Seizures 02/10/2012  . Atrial fibrillation 02/05/2012  . Coronary atherosclerosis of native coronary artery 02/05/2012  . High risk social situation 10/15/2011  . Dental attrition, excessive 03/10/2011  . Radicular low back pain 03/09/2011  . History of seizure disorder 01/29/2011  . Irritable bowel syndrome (IBS) 06/24/2010  . Urge incontinence 06/19/2010  . TOBACCO USER 11/23/2008  . DEPRESSION, MAJOR 05/31/2008  . ISCHEMIC HEART DISEASE 05/31/2008  . RAYNAUD'S DISEASE 05/31/2008  . HIATAL HERNIA 05/31/2008  . Rheumatoid arthritis(714.0) 05/31/2008  . FIBROMYALGIA 05/31/2008  . Generalized pain 05/31/2008  . DIABETES MELLITUS, TYPE II 01/26/2008  . HYPERLIPIDEMIA 01/26/2008  . ANXIETY, CHRONIC 01/26/2008  . HYPERTENSION 01/26/2008  . HEMORRHOIDS, INTERNAL 01/26/2008  . COPD 01/26/2008  .  ESOPHAGEAL MOTILITY DISORDER 01/26/2008  . GERD 01/26/2008  . DIVERTICULOSIS OF COLON 01/26/2008  . Dermatomyositis 01/26/2008   Disposition: Discharged home with taxi voucher   Discharge Condition: Stable, improved  Brief Discharge Exam:  General: Well groomed 63 y.o. male in NAD  Abdomen: +BS, soft, mildly tender in L > R lower quadrants. No rebound or guarding.   Brief Hospital Course:  /17: Admitted with mild-moderate acute diverticulitis; IV antibiotics started  6/18: Advanced diet, antibiotics transitioned to po  Assessment and Plan:  Patrick Holt is a 63 y.o. male presenting 08/01/2013 with acute diverticulitis. PMH is significant for anxiety, COPD, atrial fibrillation, CAD, HTN, HLD, chronic pain, Hx of diverticulitis and seizure disorder (see problem list above for exhaustive list).   Patrick Holt reported experiencing lower abdominal pain since March of this year which worsened over the days preceding admission. He made an appointment for this with his long-time gastroenterologist, Dr. Olevia Perches, but presented to the emergency room the day before that appointment. His pain was rated as severe ("50" on the 0-10 scale) and abdominal CT showed mild, uncomplicated sigmoid diverticulitis. IV antibiotics (clindamycin and rocephin) were started and oxyIR 10mg  q6h, dilaudid IV 1mg  q2h were provided for pain. He reported no improvement in the pain but became very hungry and was able to tolerate a soft diet. The day following admission antibiotics were transitioned to oral ceftin 500 mg po BID, clindamycin 300 mg po q6h.   Of note, Patrick Holt seemed to endorse pain out of proportion to tenderness found on exam and CT findings. He also perseverated on obtaining a colonoscopy due to intense concern for colon cancer. Azucena Freed, PA with Dr. Nichola Sizer practice was called to reschedule a follow up appointment for Patrick Holt to discuss  this. He was discharged with oral pain medication and  antibiotics as below.  Issues for Follow Up:  - Follow up lower abdominal pain and pt's desire for colonoscopy.   Significant Procedures: None  Significant Labs and Imaging:   Recent Labs Lab 08/01/13 0318 08/01/13 1542 08/02/13 0515  WBC 13.5* 9.2 6.9  HGB 15.3 14.2 12.7*  HCT 46.1 43.2 39.9  PLT 250 235 215    Recent Labs Lab 08/01/13 0318 08/01/13 1542 08/02/13 0515  NA 140  --  142  K 3.5*  --  4.3  CL 104  --  105  CO2 25  --  26  GLUCOSE 95  --  89  BUN 13  --  10  CREATININE 1.00 1.06 1.01  CALCIUM 9.0  --  8.9  ALKPHOS 82  --  76  AST 13  --  15  ALT 8  --  8  ALBUMIN 3.5  --  3.3*   Ct Abdomen Pelvis W Contrast  08/01/2013 IMPRESSION: Acute non complicated diverticulitis of the mid sigmoid colon.   Results/Tests Pending at Time of Discharge: None  Discharge Medications:    Medication List         albuterol-ipratropium 18-103 MCG/ACT inhaler  Commonly known as:  COMBIVENT  Inhale 1 puff into the lungs daily.     ALPRAZolam 1 MG tablet  Commonly known as:  XANAX  Take 1 mg by mouth at bedtime as needed for anxiety.     amiodarone 200 MG tablet  Commonly known as:  PACERONE  Take 1 tablet (200 mg total) by mouth daily before breakfast.     aspirin 81 MG chewable tablet  Chew 81 mg by mouth daily.     atorvastatin 40 MG tablet  Commonly known as:  LIPITOR  Take 1 tablet (40 mg total) by mouth daily.     cefUROXime 500 MG tablet  Commonly known as:  CEFTIN  Take 1 tablet (500 mg total) by mouth 2 (two) times daily with a meal.     clindamycin 300 MG capsule  Commonly known as:  CLEOCIN  Take 1 capsule (300 mg total) by mouth every 6 (six) hours.     divalproex 500 MG 24 hr tablet  Commonly known as:  DEPAKOTE ER  Take 1 tablet (500 mg total) by mouth daily.     esomeprazole 40 MG capsule  Commonly known as:  NEXIUM  Take 1 capsule (40 mg total) by mouth daily before breakfast.     HYDROmorphone 2 MG tablet  Commonly known as:   DILAUDID  Take 1 tablet (2 mg total) by mouth every 4 (four) hours as needed for severe pain.     hydrOXYzine 25 MG tablet  Commonly known as:  ATARAX/VISTARIL  Take 1 tablet (25 mg total) by mouth 3 (three) times daily as needed for itching.     levETIRAcetam 500 MG tablet  Commonly known as:  KEPPRA  Take 500 mg by mouth 2 (two) times daily.     multivitamin with minerals Tabs tablet  Take 1 tablet by mouth daily.     oxybutynin 5 MG tablet  Commonly known as:  DITROPAN  Take 5 mg by mouth 3 (three) times daily.     potassium chloride SA 20 MEQ tablet  Commonly known as:  K-DUR,KLOR-CON  Take 1 tablet (20 mEq total) by mouth daily.     rOPINIRole 2 MG tablet  Commonly known as:  REQUIP  Take 2  mg by mouth 3 (three) times daily.        Discharge Instructions: Please refer to Patient Instructions section of EMR for full details.  Patient was counseled important signs and symptoms that should prompt return to medical care, changes in medications, dietary instructions, activity restrictions, and follow up appointments.   Follow-Up Appointments:     Follow-up Information   Follow up with Delfin Edis, MD On 08/03/2013. (you will be called to schedule appointment)    Specialty:  Gastroenterology   Contact information:   [address] [place] [address] [phone]       Follow up with Howard Pouch, DO On 08/09/2013. (3:15pm)    Specialty:  Family Medicine   Contact information:   [place] [address] [phone]       Vance Gather, MD 08/03/2013, 11:30 AM PGY-1, [place]

## 2013-08-03 NOTE — ED Provider Notes (Signed)
Medical screening examination/treatment/procedure(s) were performed by non-physician practitioner and as supervising physician I was immediately available for consultation/collaboration.   EKG Interpretation None        Julianne Rice, MD 08/03/13 2342

## 2013-08-03 NOTE — Progress Notes (Addendum)
Discharge home . Home discharge instruction given, no question verbalized. Taxi voucher given to patient.

## 2013-08-06 ENCOUNTER — Encounter: Payer: Self-pay | Admitting: *Deleted

## 2013-08-07 ENCOUNTER — Ambulatory Visit: Payer: Self-pay | Admitting: Sports Medicine

## 2013-08-08 ENCOUNTER — Ambulatory Visit (INDEPENDENT_AMBULATORY_CARE_PROVIDER_SITE_OTHER): Payer: Medicare HMO | Admitting: Internal Medicine

## 2013-08-08 ENCOUNTER — Encounter: Payer: Self-pay | Admitting: Internal Medicine

## 2013-08-08 VITALS — BP 142/96 | HR 66 | Ht 65.0 in | Wt 202.4 lb

## 2013-08-08 DIAGNOSIS — K5732 Diverticulitis of large intestine without perforation or abscess without bleeding: Secondary | ICD-10-CM

## 2013-08-08 DIAGNOSIS — R933 Abnormal findings on diagnostic imaging of other parts of digestive tract: Secondary | ICD-10-CM

## 2013-08-08 NOTE — Discharge Summary (Signed)
Family Medicine Teaching Service  Discharge Note : Attending Jeff Walden MD Pager [phone] Inpatient Team Pager:  [phone]  I have reviewed this patient and the patient's chart and have discussed discharge planning with the resident at the time of discharge. I agree with the discharge plan as above.    

## 2013-08-08 NOTE — Progress Notes (Signed)
Patrick Holt 1950/08/29 [id number]  Note: This dictation was prepared with Dragon digital system. Any transcriptional errors that result from this procedure are unintentional.   History of Present Illness:  This is a 63 year old white male who comes for a post hospitalization visit after being treated for abdominal pain and suspected sigmoid diverticulitis. He was hospitalized from 08/01/13-08/03/13 and was treated with Cleocin and Rocephin. He was discharged on Ceftin 500 mg twice a day and Cleocin 300 mg 4 times a day. During his hospitalization, it was noted that he was requesting large amounts of pain medications, out of proportion to objective findings. 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 He used to be on large doses of steroids but he is no longer on any. He has been having colorectal screenings every 5 years because of the increased incidence of colon cancer in dermatomyositis. During colonoscopies in 2005 and 2009, adenomatous polyps were removed. On his last colonoscopy in August 2014, he had 2 tubular adenomas and moderately severe diverticulosis. An upper endoscopy at that time showed a mild esophageal stricture which was dilated with 46 [place] Maloney dilator. He comes today still complaining of lower abdominal pain, more so in the left lower quadrant. His stools have been regular. He denies rectal bleeding. He still has several days of antibiotics left.    Past Medical History  Diagnosis Date  . Rheumatoid arthritis(714.0)   . Obesity   . Major depression     with acute psychotic break in 06/2010  . Hypertension   . Hyperlipidemia   . Diverticulosis of colon   . COPD (chronic obstructive pulmonary disease)   . Anxiety   . GERD (gastroesophageal reflux disease)   . Vertigo   . Fibromyalgia   . Dermatomyositis   . Myocardial infarct     mulitple (1999, 2000, 2004)  . Raynaud's disease   . Narcotic dependence   . Peripheral neuropathy    . Internal hemorrhoids   . Ischemic heart disease   . Hiatal hernia   . Gastritis   . Diverticulitis   . Hx of adenomatous colonic polyps   . Nephrolithiasis   . Anemia   . Esophageal stricture   . Esophageal dysmotility   . Dermatomyositis   . Urge incontinence   . Otosclerosis   . Bipolar 1 disorder   . OCD (obsessive compulsive disorder)   . Sarcoidosis   . Paroxysmal a-fib   . Dysrhythmia     "irregular" (11/15/2012)  . Type II diabetes mellitus   . Seizures   . Headache(784.0)     "severe; get them often" (11/15/2012)  . Subarachnoid hemorrhage 01/2012    with subdural  hematoma.   . Atrial fibrillation 01/2012    with RVR  . Conversion disorder 06/2010  . History of narcotic addiction     Past Surgical History  Procedure Laterality Date  . Knee arthroscopy w/ meniscal repair Left 2009  . Lumbar disc surgery    . Squamous papilloma   2010    removed by Dr. Constance Holster ENT, noted on tongue  . Esophagogastroduodenoscopy N/A 09/27/2012    Procedure: ESOPHAGOGASTRODUODENOSCOPY (EGD);  Surgeon: Lafayette Dragon, MD;  Location: [facility];  Service: Endoscopy;  Laterality: N/A;  . Colonoscopy N/A 09/27/2012    Procedure: COLONOSCOPY;  Surgeon: Lafayette Dragon, MD;  Location: [facility];  Service: Endoscopy;  Laterality: N/A;  . Cataract extraction w/ intraocular lens implant Left   . Lymph node dissection  Right 1970's    "neck; dr thought I had Hodgkins; turned out to be sarcoidosis" (11/15/2012)  . Tonsillectomy    . Carpal tunnel release Bilateral   . Cardiac catheterization      Allergies  Allergen Reactions  . Betamethasone Dipropionate     Unknown  . Bupropion Hcl     Unknown  . Ciprofloxacin     REACTION: swelling  . Clobetasol     Unknown  . Codeine     Unknown  . Escitalopram Oxalate     Unknown  . Flagyl [Metronidazole]     REACTION: swelling  . Fluoxetine Hcl     Unknown  . Fluticasone-Salmeterol     Unknown  . Furosemide     Unknown  . Immune  Globulins     Acute renal failure  . Paroxetine     Unknown  . Penicillins     Unknown  . Sulfa Antibiotics Other (See Comments)    blisters  . Tacrolimus     Unknown  . Tetanus Toxoid     Unknown  . Tuberculin Purified Protein Derivative     Unknown    Family history and social history have been reviewed.  Review of Systems: Negative for dysphagia heartburn positive for abdominal pain. Positive for weight loss  The remainder of the 10 point ROS is negative except as outlined in the H&P  Physical Exam: General Appearance Well developed, in no distress appears depressed ill-appearing Eyes  Non icteric  HEENT  Non traumatic, normocephalic  Mouth No lesion, tongue papillated, no cheilosis Neck Supple without adenopathy, thyroid not enlarged, no carotid bruits, no JVD Lungs Clear to auscultation bilaterally COR Normal S1, normal S2, regular rhythm, no murmur, quiet precordium Abdomen soft, diffusely tender more so in the left lower quadrant and right lower quadrant. No distention. Normal active bowel sounds Rectal small external hemorrhoids. Normal rectal sphincter tone. Hemoccult-negative stool Extremities  No pedal edema, walks around with a cane Skin No lesions Neurological Alert and oriented x 3 Psychological Normal mood and affect  Assessment and Plan:   Problem #53 63 year old white male with symptomatic diverticulosis and episode of diverticulitis. He is currently finishing  oral antibiotics. He has a problem with dependence on pain medications and  I'm not sure if his pain is actually reflecting his diverticulosis or narcotic bowel syndrome. I would not recommend a sigmoid resection at this time. I asked him to increase fiber in his diet and take Metamucil in addition. He will be due for a recall colonoscopy in 4 years.  Problem #2 History of adenomatous polyps of the colon and dermatomyositis. He is at high risk for colon cancer. His next colonoscopy will be due in August  2019.  Problem #3 Gastroesophageal reflux and esophageal stricture. He has no symptoms at this time. He should continue on Nexium 40 mg daily.    Delfin Edis 08/08/2013

## 2013-08-08 NOTE — Patient Instructions (Signed)
Dr Buddy Duty

## 2013-08-09 ENCOUNTER — Ambulatory Visit (INDEPENDENT_AMBULATORY_CARE_PROVIDER_SITE_OTHER): Payer: Commercial Managed Care - HMO | Admitting: Family Medicine

## 2013-08-09 VITALS — Wt 194.0 lb

## 2013-08-09 DIAGNOSIS — K5730 Diverticulosis of large intestine without perforation or abscess without bleeding: Secondary | ICD-10-CM

## 2013-08-09 NOTE — Patient Instructions (Signed)
Diverticulosis Diverticulosis is the condition that develops when small pouches (diverticula) form in the wall of your colon. Your colon, or large intestine, is where water is absorbed and stool is formed. The pouches form when the inside layer of your colon pushes through weak spots in the outer layers of your colon. CAUSES  No one knows exactly what causes diverticulosis. RISK FACTORS  Being older than 51. Your risk for this condition increases with age. Diverticulosis is rare in people younger than 40 years. By age 80, almost everyone has it.  Eating a low-fiber diet.  Being frequently constipated.  Being overweight.  Not getting enough exercise.  Smoking.  Taking over-the-counter pain medicines, like aspirin and ibuprofen. SYMPTOMS  Most people with diverticulosis do not have symptoms. DIAGNOSIS  Because diverticulosis often has no symptoms, health care providers often discover the condition during an exam for other colon problems. In many cases, a health care provider will diagnose diverticulosis while using a flexible scope to examine the colon (colonoscopy). TREATMENT  If you have never developed an infection related to diverticulosis, you may not need treatment. If you have had an infection before, treatment may include:  Eating more fruits, vegetables, and grains.  Taking a fiber supplement.  Taking a live bacteria supplement (probiotic).  Taking medicine to relax your colon. HOME CARE INSTRUCTIONS   Drink at least 6-8 glasses of water each day to prevent constipation.  Try not to strain when you have a bowel movement.  Keep all follow-up appointments. If you have had an infection before:  Increase the fiber in your diet as directed by your health care provider or dietitian.  Take a dietary fiber supplement if your health care provider approves.  Only take medicines as directed by your health care provider. SEEK MEDICAL CARE IF:   You have abdominal  pain.  You have bloating.  You have cramps.  You have not gone to the bathroom in 3 days. SEEK IMMEDIATE MEDICAL CARE IF:   Your pain gets worse.  Yourbloating becomes very bad.  You have a fever or chills, and your symptoms suddenly get worse.  You begin vomiting.  You have bowel movements that are bloody or black. MAKE SURE YOU:  Understand these instructions.  Will watch your condition.  Will get help right away if you are not doing well or get worse. Document Released: 10/30/2003 Document Revised: 02/06/2013 Document Reviewed: 12/27/2012 [facility] Patient Information 2015 [place], [place]. This information is not intended to replace advice given to you by your health care provider. Make sure you discuss any questions you have with your health care provider.   Please finish your antibiotics as prescribed

## 2013-08-10 ENCOUNTER — Encounter: Payer: Self-pay | Admitting: Family Medicine

## 2013-08-10 ENCOUNTER — Telehealth: Payer: Self-pay | Admitting: Clinical

## 2013-08-10 NOTE — Telephone Encounter (Signed)
CSW returned pt call. Pt states he would like CSW's assistance in moving to a different place however pt does not have much income to pay for a deposit. CSW informed pt that pt will need to have a deposit wherever he goes. During conversation pt needed to be redirected constantly. Pt stated that his neighbors want to purchase his oxycodone, CSW asked pt how they know he is on oxycodone. Pt stated he invited a neighbor to wash her hands and she asked to buy the prescriptions, pt states he declined. CSW encouraged pt to contact the police in the future regarding concerns with drug use/prostitution in his apartment complex. CSW offered to mail pt resources and encouraged him to contact [employer] today to have it reinstated.   Hunt Oris, MSW, [place]

## 2013-08-10 NOTE — Assessment & Plan Note (Addendum)
Recent hospital discharge for diverticulitis. Patient states pain is mildly better but still present. He is taking his antibiotics as directed. He is followed up with Dr. Maurene Capes, his gastroenterologist. - And pain medications were supplied prior to discharge from the hospital. - Continue antibiotics as directed - Try eat a higher fiber meal/add Metamucil to diet.  - SW consulted for possible assistance with meals/living environment.

## 2013-08-10 NOTE — Progress Notes (Signed)
   Subjective:    Patient ID: Patrick Holt, male    DOB: 1950/10/21, 63 y.o.   MRN: [id number]  HPI Patrick Holt is a 63 y.o. male presents to [facility] for hospital followup.  Diverticulitis: Patient is doing okay. He states that his pain is mildly better. However his pain is still present. He reports he followed up with his gastroenterologist yesterday. He denies fevers, chills nausea, vomiting, rectal bleeding or diarrhea. He states he is taking his medications as directed and still has a few days left. He reports he has pain medications but he has concerns that his neighbors are going to steal it from him. He reports he has called the cops on them a few times because he thinks there is drug dealing and prostitution occurring in the upstairs apartment. He reports he is eating okay however she does not have anything but peanut butter and bananas because he makes only half of the poverty level. He is aware that he should be taking in more fiber, but reports he cannot afford it.  Of note: Patient is up-to-date with colonoscopies. Patient does have dermatomyositis which is followed at [facility]. She has had colorectal screening every 5 years because of increased risk of colon cancer in patients with dermatomyositis. Last colonoscopy was August 2014 and had to 2 tubular adenomas removed. Review of Systems Per history of present illness    Objective:   Physical Exam Wt 194 lb (87.998 kg) Blood pressure 116/71, pulse 93, temperature 99.3 Fahrenheit Gen: Patient is well-groomed, talkative, Caucasian male in no acute distress and nontoxic in appearance. HEENT: AT. [place]. Bilateral eyes without injections or icterus. MMM. CV: RRR  Abd: Soft. NTND. BS present. No Masses palpated.  Ext: No erythema. No edema.  Psych: Anxious. Tangential speech. Speech not pressured. Questionable paranoid thought content (fears neighbors will refill his medications). Memory is intact. Judgment appears intact. He answers  questions appropriately after being redirected.

## 2013-08-15 IMAGING — MR MR LUMBAR SPINE W/O CM
4 of 5 series · 20 of 48 positions shown · non-contrast
Comparison: CT scan dated 07/15/2009

CLINICAL DATA: Muscular dystrophy.  Low back pain.  Lower extremity
pain.

MRI LUMBAR SPINE WITHOUT CONTRAST
TECHNIQUE: Multiplanar and multiecho pulse sequences of the lumbar
spine were obtained without intravenous contrast.

[Series 3: T2 · sagittal · 4.0mm · 0.55mm/px · 6 of 12 slices shown (1 of 2)]
[im 1/12]
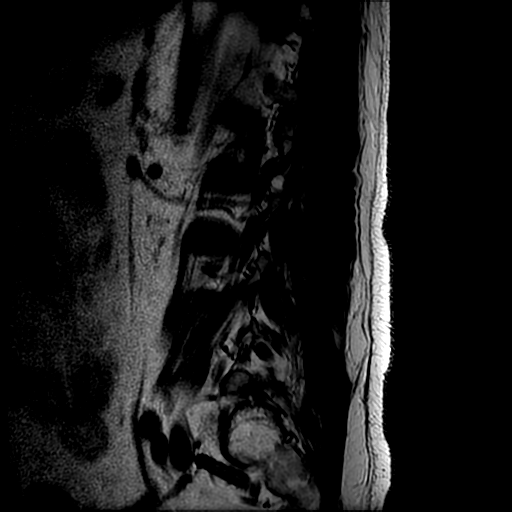
[im 3/12]
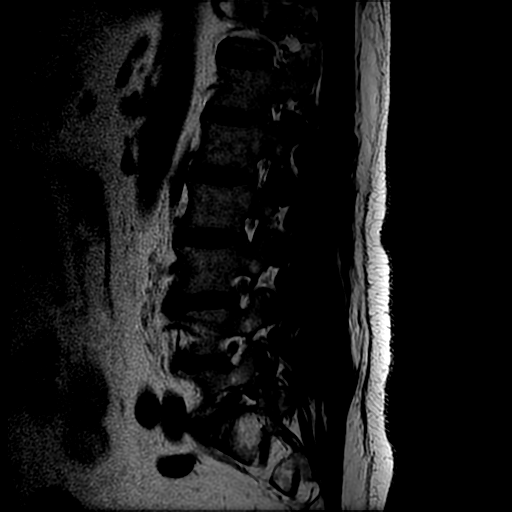
[im 5/12]
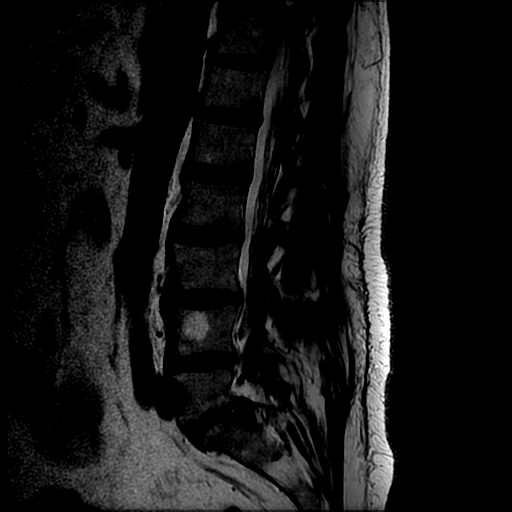
[im 7/12]
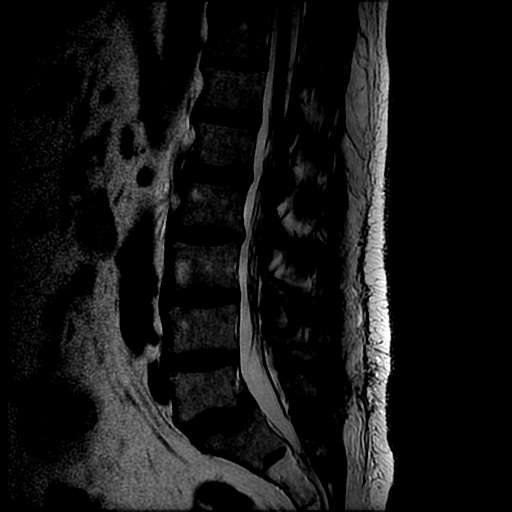
[im 9/12]
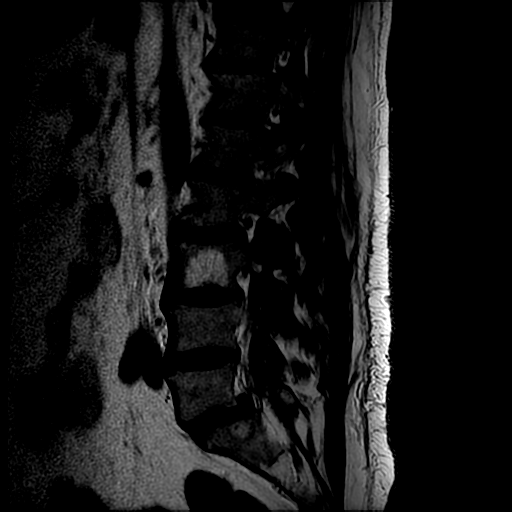
[im 12/12]
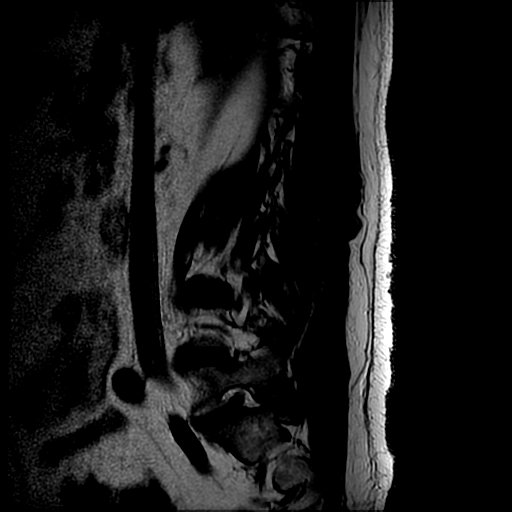

[Series 4: T1 · sagittal · 4.0mm · 0.55mm/px · 3 of 12 slices shown (1 of 2)]
[im 1/12]
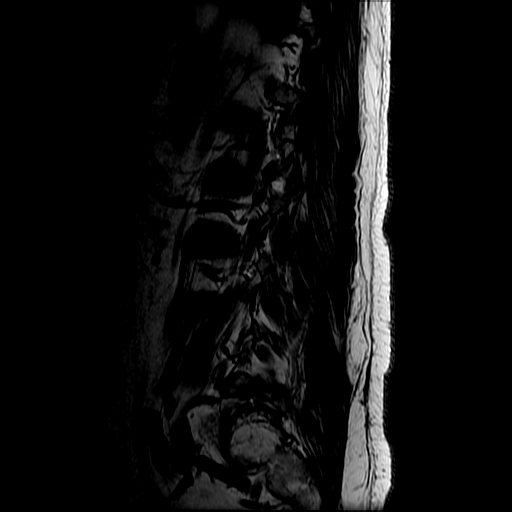
[im 6/12]
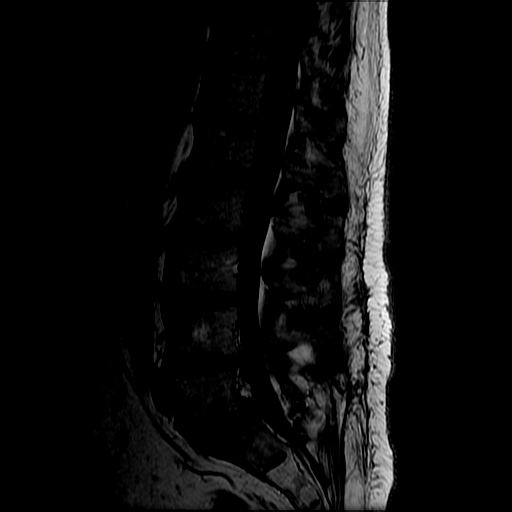
[im 12/12]
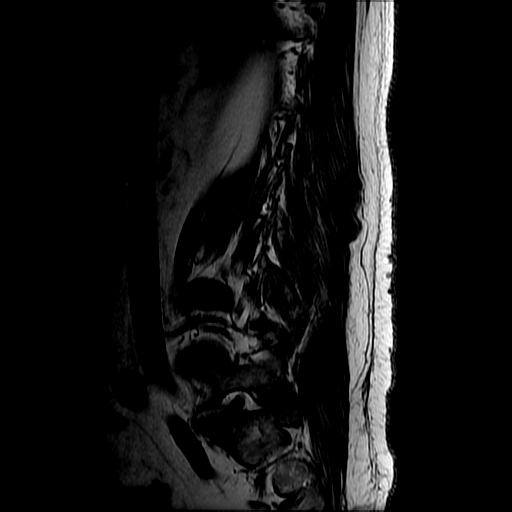

[Series 6: T2 · axial · 4.0mm · 0.39mm/px · z∈[-24,+139]mm · 8 of 36 slices shown (2 of 2)]
[im 3/36]
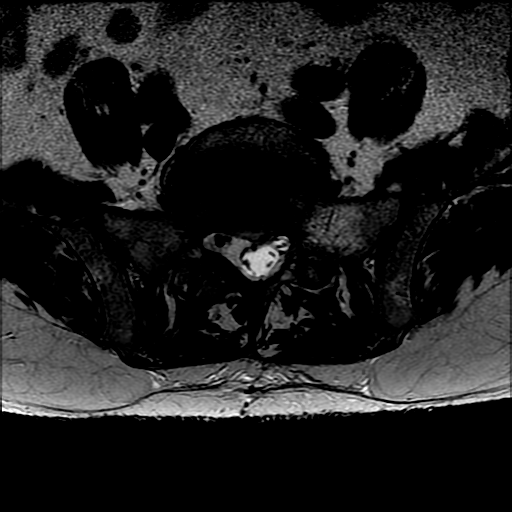
[im 5/36]
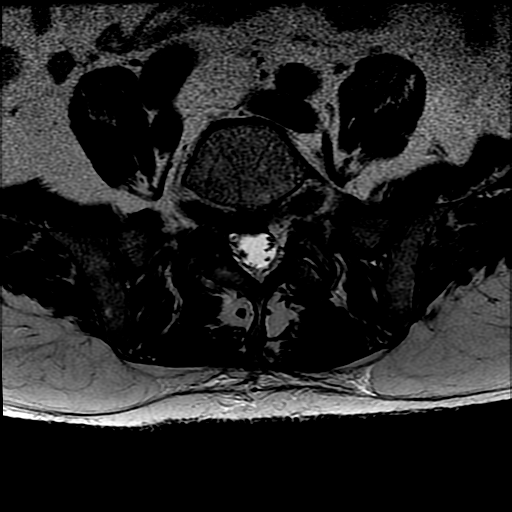
[im 8/36]
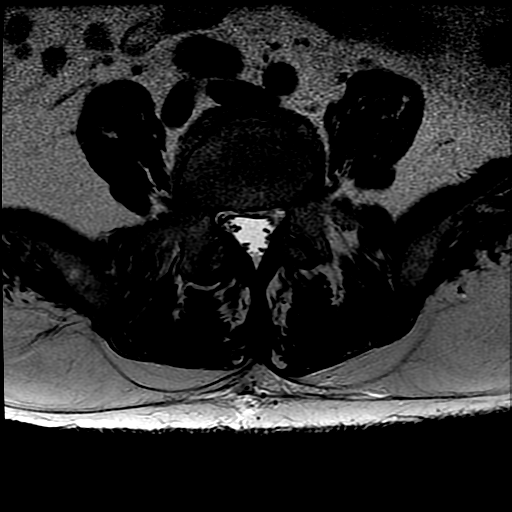
[im 12/36]
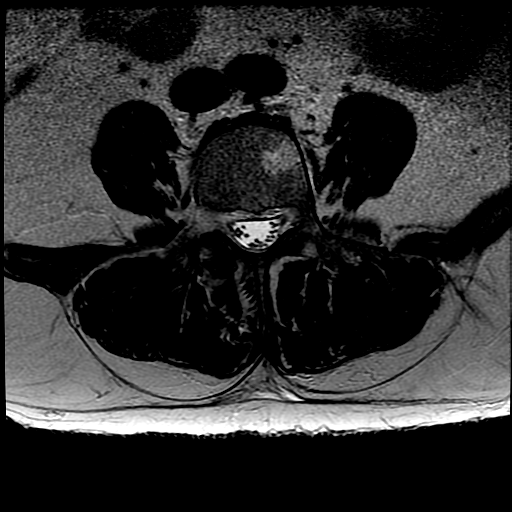
[im 17/36]
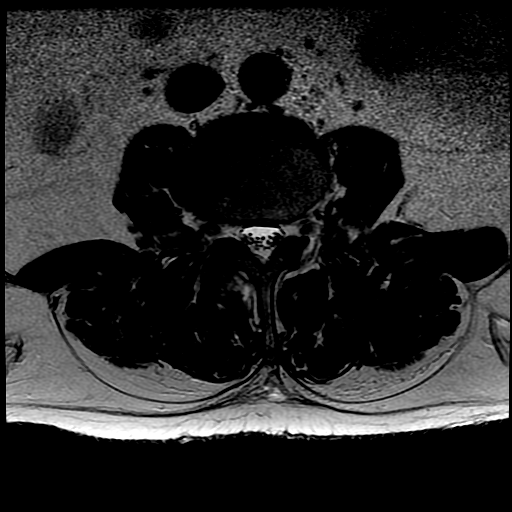
[im 19/36]
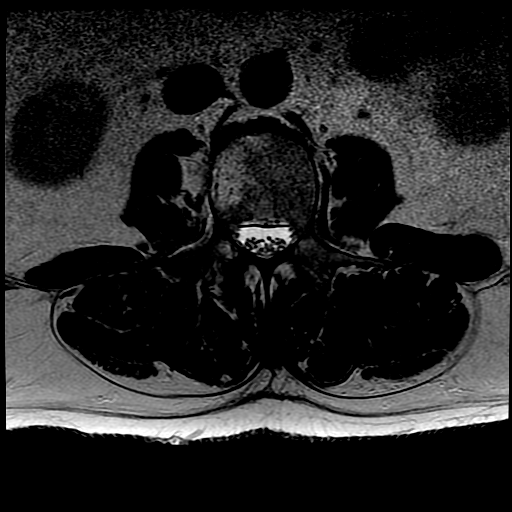
[im 22/36]
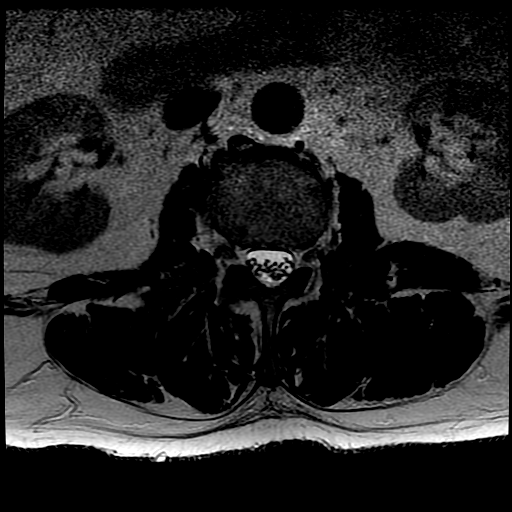
[im 31/36]
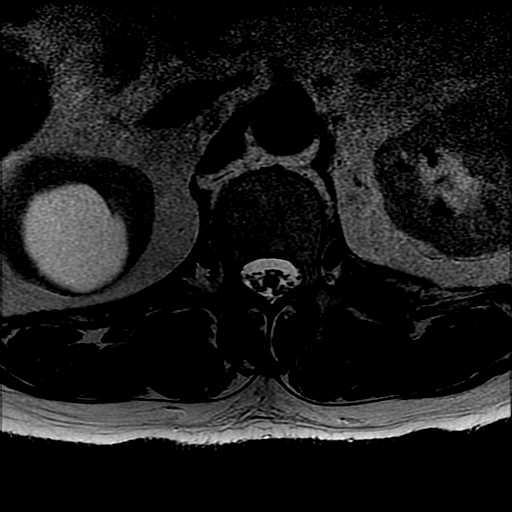

[Series 7: T1 · axial · 4.0mm · 0.78mm/px · z∈[-14,+139]mm · 3 of 36 slices shown (2 of 2)]
[im 5/36]
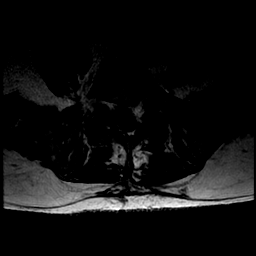
[im 19/36]
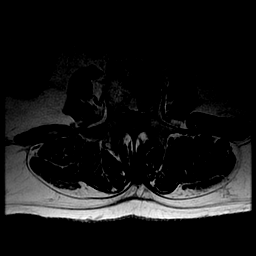
[im 31/36]
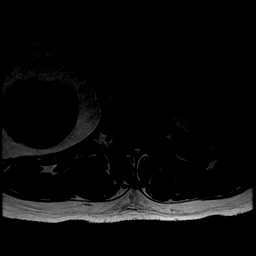

[20 of 48 positions shown; findings below may reference images not displayed]

FINDINGS: The lowest full intervertebral disk space is labeled L5-
S1.  If procedural intervention is to be performed, careful
correlation with this numbering strategy is recommended.

The conus medullaris appears unremarkable.  Conus level:  L1.

Hemangiomas noted in the L3-L4 vertebral bodies.  There also
hemangiomas at S1, S2.

Several T2 hyperintense, T1 hypointense renal lesions noted; the
findings are statistically likely to represent cysts but are not
fully evaluated on today's lumbar spine MRI. Despite efforts by the
patient and technologist, motion artifact is present on some series
of today's examination and could not be totally eliminated.  This
reduces diagnostic sensitivity and specificity.

Minimal levoconvex lumbar scoliosis noted.

Degenerative facet edema noted on the right at L3-4, also with mild
perifacet edema. Paraspinal musculature appears normal and
symmetric.

Chronic bilateral pars defects are present at L5 with 7 mm of
anterolisthesis of L5 on S1.

Additional findings at individual levels are as follows:

L2-3:  Disc bulge noted with mild right foraminal intervertebral
spurring.  There is mild facet arthropathy. The AP diameter of the
thecal sac is 10 mm, borderline for central stenosis.

L3-4:  Right facet arthropathy noted with associated facet and peri
facet edema.  Disc bulge noted. The AP diameter of the thecal sac
is 8 mm, compatible with moderate central stenosis.

L4-5:  Mild facet arthropathy and diffuse disc bulge noted.
Conjoined right L4 and L5 nerve roots noted, resulting in a low
position of the right L4 nerve roots in the neural foramen and mild
foraminal stenosis.

L5-S1:  Right greater than left facet arthropathy noted with disc
uncovering and disc bulge.  Mild bilateral foraminal stenosis is
observed along with mild right subarticular lateral recess
stenosis.
IMPRESSION: 1.  Lumbar spondylosis and degenerative disc disease noted along
with chronic pars defects at L5.  Mild to moderate degrees of
impingement are present at L3-4, L4-5, and L5-S1.

## 2013-08-27 ENCOUNTER — Ambulatory Visit: Payer: Self-pay | Admitting: Internal Medicine

## 2013-08-27 ENCOUNTER — Telehealth: Payer: Self-pay | Admitting: Family Medicine

## 2013-08-27 NOTE — Telephone Encounter (Signed)
Pt called and needs [place] to mail an handicap sticker application to his home. He also needs a letter that he is handicap and that he can not walk to his mailbox. This way the mailman can bring the mail to his door. He has been having his mail stolen since it takes a while for him to be able to get to mailbox. His address in [place] is correct. jw

## 2013-08-27 NOTE — Telephone Encounter (Signed)
Pt called back because the letter to the post office address is.  Post Master [address] [place] [address]  Jackie

## 2013-09-04 ENCOUNTER — Encounter: Payer: Self-pay | Admitting: Family Medicine

## 2013-09-04 NOTE — Telephone Encounter (Signed)
Pt called back needing a Rx for his wheelchair.  Pt stated his battery in the wheelchair will not stay charged and called his insurance company, they needed the Rx for a battery or lose connection.  He also needed the handicap application mailed to him ASAP.  The wheelchair company name is [employer] [phone].Derl Barrow, RN

## 2013-09-04 NOTE — Telephone Encounter (Signed)
Pt handicap sticker request has been completed and mailed to him today. I will write a letter stating his request on his mail delivery. This will be sent to him as well. Patrick Holt is calling the wheelchair company. I attempted to call today and was placed on hold for some time. The company will need to fax [place] a need for his wheelchair, battery in this case, and we will need to sign for approval. 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 Thanks.

## 2013-09-04 NOTE — Telephone Encounter (Signed)
I have completed and mailed the letter to the post office.

## 2013-09-05 NOTE — Telephone Encounter (Signed)
Spoke with Santiago Glad of [employer] regarding Rx needed.  Per Santiago Glad on Rx needs diagonis code and Clinical cytogeneticist for Power chair.  Fax back to [phone].  Derl Barrow, RN

## 2013-09-06 NOTE — Telephone Encounter (Signed)
Rx faxed to [employer] for service and repair.  Derl Barrow, RN

## 2013-09-06 NOTE — Telephone Encounter (Signed)
Prescription given to [place], to fax to wheelchair company for service and repair (signed by Dr. [name]).

## 2013-09-24 ENCOUNTER — Telehealth: Payer: Self-pay | Admitting: Family Medicine

## 2013-09-24 NOTE — Telephone Encounter (Signed)
Please advise.Thank you.Patrick Holt  

## 2013-09-24 NOTE — Telephone Encounter (Signed)
Pt called and his wheel chair motor is broken. He needs have the doctor orders from the doctor to have this fixed. The number to call is [phone] and the reference number is [id number]. Please call today because he has no other way to get around. jw

## 2013-09-24 NOTE — Telephone Encounter (Signed)
This order has already been placed on 08/27/2013. Please call the number given and follow up. Thanks

## 2013-09-24 NOTE — Telephone Encounter (Signed)
[employer]; representative stated that they have contacted pt regarding the issue and working with him.  Someone has already been out for home visit.  There is nothing for PCP to do per representative and he explained this to the patient.  They have received the Rx that was needed for insurance.  Derl Barrow, RN

## 2013-10-09 ENCOUNTER — Emergency Department (HOSPITAL_COMMUNITY)
Admission: EM | Admit: 2013-10-09 | Discharge: 2013-10-09 | Disposition: A | Discharge status: Home / Self Care | Payer: Medicare HMO | Attending: Emergency Medicine | Admitting: Emergency Medicine

## 2013-10-09 ENCOUNTER — Emergency Department (HOSPITAL_COMMUNITY): Payer: Medicare HMO

## 2013-10-09 ENCOUNTER — Encounter (HOSPITAL_COMMUNITY): Payer: Self-pay | Admitting: Emergency Medicine

## 2013-10-09 DIAGNOSIS — J449 Chronic obstructive pulmonary disease, unspecified: Secondary | ICD-10-CM | POA: Diagnosis not present

## 2013-10-09 DIAGNOSIS — R339 Retention of urine, unspecified: Secondary | ICD-10-CM | POA: Diagnosis present

## 2013-10-09 DIAGNOSIS — R569 Unspecified convulsions: Secondary | ICD-10-CM | POA: Diagnosis not present

## 2013-10-09 DIAGNOSIS — K219 Gastro-esophageal reflux disease without esophagitis: Secondary | ICD-10-CM | POA: Diagnosis not present

## 2013-10-09 DIAGNOSIS — F17200 Nicotine dependence, unspecified, uncomplicated: Secondary | ICD-10-CM | POA: Diagnosis not present

## 2013-10-09 DIAGNOSIS — Z862 Personal history of diseases of the blood and blood-forming organs and certain disorders involving the immune mechanism: Secondary | ICD-10-CM | POA: Insufficient documentation

## 2013-10-09 DIAGNOSIS — F319 Bipolar disorder, unspecified: Secondary | ICD-10-CM | POA: Diagnosis not present

## 2013-10-09 DIAGNOSIS — I10 Essential (primary) hypertension: Secondary | ICD-10-CM | POA: Insufficient documentation

## 2013-10-09 DIAGNOSIS — M129 Arthropathy, unspecified: Secondary | ICD-10-CM | POA: Insufficient documentation

## 2013-10-09 DIAGNOSIS — Z860100 Personal history of colon polyps, unspecified: Secondary | ICD-10-CM | POA: Insufficient documentation

## 2013-10-09 DIAGNOSIS — K5792 Diverticulitis of intestine, part unspecified, without perforation or abscess without bleeding: Secondary | ICD-10-CM

## 2013-10-09 DIAGNOSIS — Z79899 Other long term (current) drug therapy: Secondary | ICD-10-CM | POA: Insufficient documentation

## 2013-10-09 DIAGNOSIS — Z8673 Personal history of transient ischemic attack (TIA), and cerebral infarction without residual deficits: Secondary | ICD-10-CM | POA: Diagnosis not present

## 2013-10-09 DIAGNOSIS — Z7982 Long term (current) use of aspirin: Secondary | ICD-10-CM | POA: Diagnosis not present

## 2013-10-09 DIAGNOSIS — K2970 Gastritis, unspecified, without bleeding: Secondary | ICD-10-CM | POA: Diagnosis not present

## 2013-10-09 DIAGNOSIS — Z8669 Personal history of other diseases of the nervous system and sense organs: Secondary | ICD-10-CM | POA: Insufficient documentation

## 2013-10-09 DIAGNOSIS — Z87442 Personal history of urinary calculi: Secondary | ICD-10-CM | POA: Diagnosis not present

## 2013-10-09 DIAGNOSIS — F411 Generalized anxiety disorder: Secondary | ICD-10-CM | POA: Insufficient documentation

## 2013-10-09 DIAGNOSIS — I4891 Unspecified atrial fibrillation: Secondary | ICD-10-CM | POA: Insufficient documentation

## 2013-10-09 DIAGNOSIS — I252 Old myocardial infarction: Secondary | ICD-10-CM | POA: Insufficient documentation

## 2013-10-09 DIAGNOSIS — E785 Hyperlipidemia, unspecified: Secondary | ICD-10-CM | POA: Insufficient documentation

## 2013-10-09 DIAGNOSIS — J4489 Other specified chronic obstructive pulmonary disease: Secondary | ICD-10-CM | POA: Insufficient documentation

## 2013-10-09 DIAGNOSIS — F329 Major depressive disorder, single episode, unspecified: Secondary | ICD-10-CM | POA: Insufficient documentation

## 2013-10-09 DIAGNOSIS — K5732 Diverticulitis of large intestine without perforation or abscess without bleeding: Secondary | ICD-10-CM | POA: Diagnosis not present

## 2013-10-09 DIAGNOSIS — E669 Obesity, unspecified: Secondary | ICD-10-CM | POA: Diagnosis not present

## 2013-10-09 DIAGNOSIS — E119 Type 2 diabetes mellitus without complications: Secondary | ICD-10-CM | POA: Insufficient documentation

## 2013-10-09 DIAGNOSIS — Z880 Allergy status to penicillin: Secondary | ICD-10-CM | POA: Insufficient documentation

## 2013-10-09 DIAGNOSIS — K2990 Gastroduodenitis, unspecified, without bleeding: Secondary | ICD-10-CM | POA: Diagnosis not present

## 2013-10-09 LAB — URINALYSIS, ROUTINE W REFLEX MICROSCOPIC
Bilirubin Urine: NEGATIVE
Glucose, UA: NEGATIVE mg/dL
HGB URINE DIPSTICK: NEGATIVE
Ketones, ur: NEGATIVE mg/dL
Leukocytes, UA: NEGATIVE
NITRITE: NEGATIVE
PROTEIN: NEGATIVE mg/dL
Specific Gravity, Urine: 1.025 (ref 1.005–1.030)
UROBILINOGEN UA: 1 mg/dL (ref 0.0–1.0)
pH: 6 (ref 5.0–8.0)

## 2013-10-09 LAB — CBC WITH DIFFERENTIAL/PLATELET
BASOS ABS: 0 10*3/uL (ref 0.0–0.1)
Basophils Relative: 0 % (ref 0–1)
EOS ABS: 0.3 10*3/uL (ref 0.0–0.7)
EOS PCT: 2 % (ref 0–5)
HCT: 46.1 % (ref 39.0–52.0)
Hemoglobin: 15.3 g/dL (ref 13.0–17.0)
Lymphocytes Relative: 23 % (ref 12–46)
Lymphs Abs: 3.1 10*3/uL (ref 0.7–4.0)
MCH: 30.7 pg (ref 26.0–34.0)
MCHC: 33.2 g/dL (ref 30.0–36.0)
MCV: 92.6 fL (ref 78.0–100.0)
Monocytes Absolute: 1.2 10*3/uL — ABNORMAL HIGH (ref 0.1–1.0)
Monocytes Relative: 9 % (ref 3–12)
NEUTROS PCT: 66 % (ref 43–77)
Neutro Abs: 9 10*3/uL — ABNORMAL HIGH (ref 1.7–7.7)
PLATELETS: 266 10*3/uL (ref 150–400)
RBC: 4.98 MIL/uL (ref 4.22–5.81)
RDW: 14.1 % (ref 11.5–15.5)
WBC: 13.7 10*3/uL — AB (ref 4.0–10.5)

## 2013-10-09 LAB — BASIC METABOLIC PANEL
Anion gap: 11 (ref 5–15)
BUN: 15 mg/dL (ref 6–23)
CO2: 29 mEq/L (ref 19–32)
Calcium: 9.2 mg/dL (ref 8.4–10.5)
Chloride: 106 mEq/L (ref 96–112)
Creatinine, Ser: 0.99 mg/dL (ref 0.50–1.35)
GFR calc Af Amer: >90 mL/min (ref >90)
GFR, EST NON AFRICAN AMERICAN: 85 mL/min — AB (ref >90)
Glucose, Bld: 93 mg/dL (ref 70–99)
POTASSIUM: 3.9 meq/L (ref 3.7–5.3)
SODIUM: 146 meq/L (ref 137–147)

## 2013-10-09 MED ORDER — ONDANSETRON 4 MG PO TBDP: ORAL_TABLET | ORAL | Status: DC

## 2013-10-09 MED ORDER — HYDROMORPHONE HCL PF 1 MG/ML IJ SOLN
0.5000 mg | Freq: Once | INTRAMUSCULAR | Status: AC
  Administered 2013-10-09: 0.5 mg via INTRAVENOUS
  Filled 2013-10-09: qty 1

## 2013-10-09 MED ORDER — HYDROCODONE-ACETAMINOPHEN 5-325 MG PO TABS: 2.0000 | ORAL_TABLET | ORAL | Status: DC | PRN

## 2013-10-09 MED ORDER — HYDROMORPHONE HCL PF 1 MG/ML IJ SOLN
0.5000 mg | Freq: Once | INTRAMUSCULAR | Status: AC
Start: 2013-10-09 — End: 2013-10-09
  Administered 2013-10-09: 0.5 mg via INTRAVENOUS
  Filled 2013-10-09: qty 1

## 2013-10-09 MED ORDER — HYDROCODONE-ACETAMINOPHEN 5-325 MG PO TABS: 2.0000 | ORAL_TABLET | Freq: Once | ORAL | Status: DC

## 2013-10-09 MED ORDER — FENTANYL CITRATE 0.05 MG/ML IJ SOLN
75.0000 ug | INTRAMUSCULAR | Status: DC | PRN
  Administered 2013-10-09: 75 ug via INTRAVENOUS
  Filled 2013-10-09: qty 2

## 2013-10-09 MED ORDER — IOHEXOL 300 MG/ML  SOLN
100.0000 mL | Freq: Once | INTRAMUSCULAR | Status: AC | PRN
  Administered 2013-10-09: 100 mL via INTRAVENOUS

## 2013-10-09 MED ORDER — IOHEXOL 300 MG/ML  SOLN
50.0000 mL | Freq: Once | INTRAMUSCULAR | Status: AC | PRN
  Administered 2013-10-09: 50 mL via ORAL

## 2013-10-09 NOTE — ED Notes (Signed)
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 Pt was given bus pass, however he is insisting that he can't take bus. He is asking for security officers to give him ride home. Explained to pt they can't do that, however they can transport him to bus stop via shuttle. Offered to call an ambulance for pt, since he sts he is not ambulatory,however pt then sts that he walks with a cain and [facility] will not take him home.

## 2013-10-09 NOTE — ED Notes (Signed)
Pt finished drinking his po contrast  Waiting on CT  Pt is c/o his back itching  Lotion rubbed on pt back per his request

## 2013-10-09 NOTE — ED Notes (Signed)
MD at bedside discussing CT results.

## 2013-10-09 NOTE — Discharge Instructions (Signed)
If you were given medicines take as directed.  If you are on coumadin or contraceptives realize their levels and effectiveness is altered by many different medicines.  If you have any reaction (rash, tongues swelling, other) to the medicines stop taking and see a physician.   Take your clindamycin as directed for 7 days. Liquid and soft diet until pain improves. For severe pain take norco or vicodin however realize they have the potential for addiction and it can make you sleepy and has tylenol in it.  No operating machinery while taking.   Please follow up as directed and return to the ER or see a physician for new or worsening symptoms.  Thank you. Filed Vitals:   10/09/13 0112 10/09/13 0625  BP: 162/98 167/86  Pulse: 65 69  Temp: 98.8 F (37.1 C)   TempSrc: Oral   Resp: 18 18  SpO2: 97% 95%    Diverticulitis Diverticulitis is when small pockets that have formed in your colon (large intestine) become infected or swollen. HOME CARE  Follow your doctor's instructions.  Follow a special diet if told by your doctor.  When you feel better, your doctor may tell you to change your diet. You may be told to eat a lot of fiber. Fruits and vegetables are good sources of fiber. Fiber makes it easier to poop (have bowel movements).  Take supplements or probiotics as told by your doctor.  Only take medicines as told by your doctor.  Keep all follow-up visits with your doctor. GET HELP IF:  Your pain does not get better.  You have a hard time eating food.  You are not pooping like normal. GET HELP RIGHT AWAY IF:  Your pain gets worse.  Your problems do not get better.  Your problems suddenly get worse.  You have a fever.  You keep throwing up (vomiting).  You have bloody or black, tarry poop (stool). MAKE SURE YOU:   Understand these instructions.  Will watch your condition.  Will get help right away if you are not doing well or get worse. Document Released: 07/21/2007  Document Revised: 02/06/2013 Document Reviewed: 12/27/2012 [facility] Patient Information 2015 [place], [place]. This information is not intended to replace advice given to you by your health care provider. Make sure you discuss any questions you have with your health care provider.

## 2013-10-09 NOTE — ED Notes (Addendum)
Pt brought in by EMS with c/o urinary retention  Pt last voided this afternoon and had some diarrhea this afternoon as well  Pt is c/o abd pain rating it a 4/10

## 2013-10-09 NOTE — ED Notes (Signed)
Pt states he has been in and out of the hospital since March  Pt states he was diagnosed with diverticulitis/diverticulosis  Pt states he has been on several different antibiotics but continues to have issues  Pt states he has been having bladder control issues along with this  Pt states he has been having problems passing his urine today

## 2013-10-09 NOTE — ED Notes (Signed)
MD at bedside. 

## 2013-10-09 NOTE — ED Provider Notes (Signed)
CSN: [id number]     Arrival date & time 10/09/13  0016 History   First MD Initiated Contact with Patient 10/09/13 0234     Chief Complaint  Patient presents with  . Urinary Retention     (Consider location/radiation/quality/duration/timing/severity/associated sxs/prior Treatment) HPI Comments: 63 year old male with history of diverticulitis, COPD, anxiety, arthritis, narcotic dependence, ischemic heart disease, bipolar, sarcoidosis, atrial fibrillation presents with lower abdominal cramping worsening for the past 4 days. Patient has had this multiple times in the past and feels similar to diverticulitis. Patient has history of urinary retention and incontinence multiple times in the past. Patient denies leg weakness back pain or neuro symptoms. Pain is sharp/ache sensation worse suprapubic. Kidney stone history in the past.  The history is provided by the patient.    Past Medical History  Diagnosis Date  . Rheumatoid arthritis(714.0)   . Obesity   . Major depression     with acute psychotic break in 06/2010  . Hypertension   . Hyperlipidemia   . Diverticulosis of colon   . COPD (chronic obstructive pulmonary disease)   . Anxiety   . GERD (gastroesophageal reflux disease)   . Vertigo   . Fibromyalgia   . Dermatomyositis   . Myocardial infarct     mulitple (1999, 2000, 2004)  . Raynaud's disease   . Narcotic dependence   . Peripheral neuropathy   . Internal hemorrhoids   . Ischemic heart disease   . Hiatal hernia   . Gastritis   . Diverticulitis   . Hx of adenomatous colonic polyps   . Nephrolithiasis   . Anemia   . Esophageal stricture   . Esophageal dysmotility   . Dermatomyositis   . Urge incontinence   . Otosclerosis   . Bipolar 1 disorder   . OCD (obsessive compulsive disorder)   . Sarcoidosis   . Paroxysmal a-fib   . Dysrhythmia     "irregular" (11/15/2012)  . Type II diabetes mellitus   . Seizures   . Headache(784.0)     "severe; get them often" (11/15/2012)   . Subarachnoid hemorrhage 01/2012    with subdural  hematoma.   . Atrial fibrillation 01/2012    with RVR  . Conversion disorder 06/2010  . History of narcotic addiction    Past Surgical History  Procedure Laterality Date  . Knee arthroscopy w/ meniscal repair Left 2009  . Lumbar disc surgery    . Squamous papilloma   2010    removed by Dr. Constance Holster ENT, noted on tongue  . Esophagogastroduodenoscopy N/A 09/27/2012    Procedure: ESOPHAGOGASTRODUODENOSCOPY (EGD);  Surgeon: Lafayette Dragon, MD;  Location: [facility];  Service: Endoscopy;  Laterality: N/A;  . Colonoscopy N/A 09/27/2012    Procedure: COLONOSCOPY;  Surgeon: Lafayette Dragon, MD;  Location: [facility];  Service: Endoscopy;  Laterality: N/A;  . Cataract extraction w/ intraocular lens implant Left   . Lymph node dissection Right 1970's    "neck; dr thought I had Hodgkins; turned out to be sarcoidosis" (11/15/2012)  . Tonsillectomy    . Carpal tunnel release Bilateral   . Cardiac catheterization     Family History  Problem Relation Age of Onset  . Alcohol abuse Mother   . Depression Mother   . Heart disease Mother   . Diabetes Mother   . Stroke Mother   . Diabetes Other     1/2 brother  . Hepatitis Brother    History  Substance Use Topics  . Smoking status: Current  Every Day Smoker -- 0.50 packs/day for 30 years    Types: Cigarettes  . Smokeless tobacco: Never Used  . Alcohol Use: No     Comment: Alcohol stopped in September of 2014    Review of Systems  Constitutional: Negative for fever and chills.  HENT: Negative for congestion.   Eyes: Negative for visual disturbance.  Respiratory: Negative for shortness of breath.   Cardiovascular: Negative for chest pain.  Gastrointestinal: Positive for abdominal pain and diarrhea. Negative for nausea and vomiting.  Genitourinary: Positive for difficulty urinating. Negative for dysuria and flank pain.  Musculoskeletal: Negative for back pain, neck pain and neck stiffness.   Skin: Negative for rash.  Neurological: Negative for light-headedness and headaches.      Allergies  Betamethasone dipropionate; Bupropion hcl; Ciprofloxacin; Clobetasol; Codeine; Escitalopram oxalate; Flagyl; Fluoxetine hcl; Fluticasone-salmeterol; Furosemide; Immune globulins; Paroxetine; Penicillins; Sulfa antibiotics; Tacrolimus; Tetanus toxoid; and Tuberculin purified protein derivative  Home Medications   Prior to Admission medications   Medication Sig Start Date End Date Taking? Authorizing Provider  albuterol-ipratropium (COMBIVENT) 18-103 MCG/ACT inhaler Inhale 1 puff into the lungs daily. 06/20/13  Yes Gerda Diss, DO  ALPRAZolam Duanne Moron) 1 MG tablet Take 1 mg by mouth at bedtime as needed for anxiety.   Yes Historical Provider, MD  amiodarone (PACERONE) 200 MG tablet Take 1 tablet (200 mg total) by mouth daily before breakfast. 06/20/13  Yes Gerda Diss, DO  aspirin 81 MG chewable tablet Chew 81 mg by mouth daily.   Yes Historical Provider, MD  atorvastatin (LIPITOR) 40 MG tablet Take 1 tablet (40 mg total) by mouth daily. 03/26/13  Yes Gerda Diss, DO  divalproex (DEPAKOTE ER) 500 MG 24 hr tablet Take 1 tablet (500 mg total) by mouth daily. 06/20/13  Yes Gerda Diss, DO  esomeprazole (NEXIUM) 40 MG capsule Take 1 capsule (40 mg total) by mouth daily before breakfast. 06/11/13  Yes Gerda Diss, DO  HYDROmorphone (DILAUDID) 2 MG tablet Take 1 tablet (2 mg total) by mouth every 4 (four) hours as needed for severe pain. 08/03/13  Yes Patrecia Pour, MD  levETIRAcetam (KEPPRA) 500 MG tablet Take 500 mg by mouth 2 (two) times daily.  01/10/13  Yes Gerda Diss, DO  Multiple Vitamin (MULTIVITAMIN WITH MINERALS) TABS tablet Take 1 tablet by mouth daily. 12/29/12  Yes Kandis Nab, MD  oxybutynin (DITROPAN) 5 MG tablet Take 5 mg by mouth 3 (three) times daily. 01/10/13  Yes Gerda Diss, DO  potassium chloride SA (K-DUR,KLOR-CON) 20 MEQ tablet Take 1 tablet (20 mEq  total) by mouth daily. 04/02/13  Yes Gerda Diss, DO  rOPINIRole (REQUIP) 2 MG tablet Take 2 mg by mouth 3 (three) times daily.   Yes Historical Provider, MD   BP 162/98  Pulse 65  Temp(Src) 98.8 F (37.1 C) (Oral)  Resp 18  SpO2 97% Physical Exam  Nursing note and vitals reviewed. Constitutional: He is oriented to person, place, and time. He appears well-developed and well-nourished.  HENT:  Head: Normocephalic and atraumatic.  Eyes: Conjunctivae are normal. Right eye exhibits no discharge. Left eye exhibits no discharge.  Neck: Normal range of motion. Neck supple. No tracheal deviation present.  Cardiovascular: Normal rate and regular rhythm.   Pulmonary/Chest: Effort normal and breath sounds normal.  Abdominal: Soft. He exhibits no distension. There is tenderness (suprapubic most severe, very mild lower abdomen pelvis.). There is no guarding.  Musculoskeletal: He exhibits no edema.  Neurological: He  is alert and oriented to person, place, and time.  Skin: Skin is warm. No rash noted.  Psychiatric: He has a normal mood and affect.    ED Course  Procedures (including critical care time) Limited Ultrasound of bladder  Performed by Dr. Reather Converse Indication: to assess for urinary retention and/or bladder volume prior to urinary catheter Technique:  Low frequency probe utilized in two planes to assess bladder volume in real-time. Findings: Bladder volume minimal  Images were archived electronically  Labs Review Labs Reviewed  URINALYSIS, ROUTINE W REFLEX MICROSCOPIC - Abnormal; Notable for the following:    APPearance CLOUDY (*)    All other components within normal limits  CBC WITH DIFFERENTIAL - Abnormal; Notable for the following:    WBC 13.7 (*)    Neutro Abs 9.0 (*)    Monocytes Absolute 1.2 (*)    All other components within normal limits  BASIC METABOLIC PANEL - Abnormal; Notable for the following:    GFR calc non Af Amer 85 (*)    All other components within  normal limits    Imaging Review Ct Abdomen Pelvis W Contrast  10/09/2013   CLINICAL DATA:  Abdominal pain, urinary retention.  EXAM: CT ABDOMEN AND PELVIS WITH CONTRAST  TECHNIQUE: Multidetector CT imaging of the abdomen and pelvis was performed using the standard protocol following bolus administration of intravenous contrast.  CONTRAST:  177mL OMNIPAQUE IOHEXOL 300 MG/ML  SOLN  COMPARISON:  CT of the abdomen and pelvis August 01, 2013  FINDINGS: LUNG BASES: Included view of the lung bases demonstrate bilateral lower lobe atelectasis. Visualized heart and pericardium are unremarkable.  SOLID ORGANS: The liver, spleen, gallbladder, pancreas and adrenal glands are unremarkable.  GASTROINTESTINAL TRACT: Colonic diverticulosis with superimposed short segment of sigmoid inflammation, trace free fluid. The stomach, small bowel are normal in course and caliber without inflammatory changes. Enteric contrast has not yet reached the distal small bowel. Normal appendix.  KIDNEYS/ URINARY TRACT: Kidneys are orthotopic, demonstrating symmetric enhancement. No nephrolithiasis, hydronephrosis or solid renal masses. Stable 6.3 cm right upper pole renal cyst, additional smaller right renal cyst. 19 mm left renal cyst. Additional too small to characterize hypodensities bilaterally. The unopacified ureters are normal in course and caliber. Delayed imaging through the kidneys demonstrates symmetric prompt contrast excretion within the proximal urinary collecting system. Urinary bladder is partially distended and unremarkable.  PERITONEUM/RETROPERITONEUM: No intraperitoneal abscess nor free air. Focal ectasia of the infrarenal aorta without aneurysm ; moderate to severe calcific atherosclerosis of the aortoiliac vessels. No lymphadenopathy by CT size criteria. Internal reproductive organs are unremarkable. Phleboliths in the pelvis.  SOFT TISSUE/OSSEOUS STRUCTURES: Nonsuspicious. Follow-up fat containing inguinal hernia. Grade 1/2  L5-S1 anterolisthesis, left L5 pars interarticularis defect.  IMPRESSION: Acute recurrent sigmoid diverticulitis without bowel perforation, abscess nor bowel obstruction.   Electronically Signed   By: Elon Alas   On: 10/09/2013 06:11     EKG Interpretation None      MDM   Final diagnoses:  Acute diverticulitis   Well-appearing male in no distress presents with recurrent suprapubic abdominal discomfort. Discussed differential including urine infection, colitis, diverticulitis, other. Benign abdominal exam, plan for urinalysis blood work, pain meds and reassessment. Bedside ultrasound revealed minimal urine in the bladder. No retention  Patient required multiple doses of pain meds and pain improved in ER. CT results reviewed showing acute diverticulitis similar to previous no perforation or abscess.  Discussed close outpatient followup with patient, patient has clindamycin and will resume taking, patient has multiple  allergies making it difficult to change treatment option.  Results and differential diagnosis were discussed with the patient/parent/guardian. Close follow up outpatient was discussed, comfortable with the plan.   Medications  fentaNYL (SUBLIMAZE) injection 75 mcg (75 mcg Intravenous Given 10/09/13 0342)  iohexol (OMNIPAQUE) 300 MG/ML solution 50 mL (50 mLs Oral Contrast Given 10/09/13 0443)  HYDROmorphone (DILAUDID) injection 0.5 mg (0.5 mg Intravenous Given 10/09/13 0502)  iohexol (OMNIPAQUE) 300 MG/ML solution 100 mL (100 mLs Intravenous Contrast Given 10/09/13 0550)  HYDROmorphone (DILAUDID) injection 0.5 mg (0.5 mg Intravenous Given 10/09/13 0635)    Filed Vitals:   10/09/13 0112 10/09/13 0625  BP: 162/98 167/86  Pulse: 65 69  Temp: 98.8 F (37.1 C)   TempSrc: Oral   Resp: 18 18  SpO2: 97% 95%       Mariea Clonts, MD 10/09/13 [phone]

## 2013-10-09 NOTE — ED Notes (Signed)
Patient transported to CT 

## 2013-10-12 ENCOUNTER — Encounter: Payer: Self-pay | Admitting: Family Medicine

## 2013-10-12 ENCOUNTER — Ambulatory Visit (INDEPENDENT_AMBULATORY_CARE_PROVIDER_SITE_OTHER): Payer: Commercial Managed Care - HMO | Admitting: Family Medicine

## 2013-10-12 VITALS — BP 144/84 | Temp 98.2°F | Wt 214.0 lb

## 2013-10-12 DIAGNOSIS — E119 Type 2 diabetes mellitus without complications: Secondary | ICD-10-CM

## 2013-10-12 DIAGNOSIS — K5732 Diverticulitis of large intestine without perforation or abscess without bleeding: Secondary | ICD-10-CM

## 2013-10-12 LAB — POCT GLYCOSYLATED HEMOGLOBIN (HGB A1C): HEMOGLOBIN A1C: 5.6

## 2013-10-12 LAB — LDL CHOLESTEROL, DIRECT: LDL DIRECT: 77 mg/dL

## 2013-10-12 NOTE — Progress Notes (Signed)
Patient ID: IMANOL BIHL, male   DOB: 1950/07/07, 63 y.o.   MRN: [id number]   Subjective:    Patient ID: ZACHARI ALBERTA, male    DOB: 1950-06-29, 63 y.o.   MRN: [id number]  HPI  CC: ED follow up  # Diverticulitis:  Seen in ED 2 days ago for lower abdominal pain, had bladder scan which showed no retention and CT abdomen that showed diverticulitis  Patient has history of diverticulitis  Was told to take clindamycin that he had already had at home  States he "finished a bottle" and then took another clindamycin capsule today. States he was taking 2 per day  Continues to have lower abdominal pain ROS: denies fevers/chills, heartburn, bloody or dark stools, difficult urination  Review of Systems   See HPI for ROS. All other systems reviewed and are negative. Objective:  BP 144/84  Temp(Src) 98.2 F (36.8 C) (Oral)  Wt 214 lb (97.07 kg) Vitals reviewed  General: NAD, in powered wheelchair CV: RRR, normal heart sounds, no murmurs appreciated Resp: CTAB, normal effort Abdomen: soft, obese, mild tenderness suprapubic and LLQ, no rebound or guarding. Normal bowel sounds    Assessment & Plan:  See Problem List Documentation

## 2013-10-12 NOTE — Patient Instructions (Signed)
You were diagnosed with Diverticulitis, which is infection of the diverticula (outpouching of the large intestine). This is different from Diverticulosis, where the diverticula bleed.  You are allergic to some of the standard antibiotic treatments. You need to take your antibiotics as the following:  Clindamycin 1 tablet 4 times a day for 10 days

## 2013-10-14 DIAGNOSIS — K5732 Diverticulitis of large intestine without perforation or abscess without bleeding: Secondary | ICD-10-CM | POA: Insufficient documentation

## 2013-10-14 NOTE — Assessment & Plan Note (Signed)
Dx'd diverticulitis in ED and instructed to take clindamycin. Patient has not been taking as directed, and appears to have been noncompliant with previous prescriptions as he brings in clindamycin 300mg  with 39 tablets prescribed several months ago. Overall exam is reassuring with no major red flags of worsening infection. Patient is unfortunately allergic to cipro, flagyl, penicillins, so it appears clindamycin would continue to be the best option at this point. Plan: finish clindamycin 300mg  QID x 10 days, instructed that he needs to take this 4 times a day and needs to finish the course. Return precautions given.

## 2013-10-18 ENCOUNTER — Other Ambulatory Visit: Payer: Self-pay | Admitting: Sports Medicine

## 2013-10-19 ENCOUNTER — Other Ambulatory Visit: Payer: Self-pay | Admitting: Sports Medicine

## 2013-10-23 ENCOUNTER — Other Ambulatory Visit: Payer: Self-pay | Admitting: Family Medicine

## 2013-10-23 ENCOUNTER — Telehealth: Payer: Self-pay | Admitting: Family Medicine

## 2013-10-23 NOTE — Telephone Encounter (Signed)
Pt called and is out of his Lipitor and would like this called today. jw

## 2013-10-24 ENCOUNTER — Other Ambulatory Visit: Payer: Self-pay | Admitting: *Deleted

## 2013-10-24 NOTE — Telephone Encounter (Signed)
Pt called and would like to check the status of his request on Lipitor. jw

## 2013-10-25 MED ORDER — ATORVASTATIN CALCIUM 40 MG PO TABS: 40.0000 mg | ORAL_TABLET | Freq: Every day | ORAL | Status: DC

## 2013-10-25 NOTE — Telephone Encounter (Signed)
Pt called again and has been calling for 2 days now and several times a day to check on the status of his request for Lipitor. If we are not going to fill please call him and let him know or please send this in. jw

## 2013-10-26 NOTE — Telephone Encounter (Signed)
Patient picked up Rx yesterday.  Derl Barrow, RN

## 2013-10-26 NOTE — Telephone Encounter (Signed)
This request has been addressed multiple times this week. Please call pharmacy and patient and confirm correct pharmacy for patient [facility]) and that pharmacy has completed the order. According to epic e-scribe the pharmacy has received script...multiple times. Of note: this patient is known to have some mental disability. Thanks

## 2013-11-17 ENCOUNTER — Other Ambulatory Visit: Payer: Self-pay | Admitting: Sports Medicine

## 2013-11-26 ENCOUNTER — Telehealth: Payer: Self-pay | Admitting: Family Medicine

## 2013-11-26 ENCOUNTER — Ambulatory Visit: Payer: Self-pay | Admitting: Family Medicine

## 2013-11-26 NOTE — Telephone Encounter (Signed)
Pt calling about a referral to an oral surgeon. Pt says when he saw Dr. Mingo Amber and Dr. Paulla Fore he was told a lot of his medical problems were most likely due to his dental problems. Pt was on chemo for a long period of time and it has caused his teeth to go bad. Pts bottom teeth have broken off into his gums and he needs to have them removed. Pt says [employer] does not cover a lot so he spoke with a Robin with [employer] that says if we call them they may be able to have this covered under medical. Please call pt with questions.

## 2013-11-26 NOTE — Telephone Encounter (Signed)
Pt called asking for a referral to an oral surgeon. He states that he talked to a [employer] representative that told him that if we call them than this could be covered underneath medical insurance. Patient would need to still be evaluated for his current dental issues in our clinic. Please advised patient to make an appointment to discuss referral and be evaluated. Thanks

## 2013-12-07 ENCOUNTER — Encounter: Payer: Self-pay | Admitting: Family Medicine

## 2013-12-07 ENCOUNTER — Ambulatory Visit (INDEPENDENT_AMBULATORY_CARE_PROVIDER_SITE_OTHER): Payer: Commercial Managed Care - HMO | Admitting: Family Medicine

## 2013-12-07 VITALS — BP 152/83 | HR 67 | Temp 98.0°F | Ht 65.0 in | Wt 217.0 lb

## 2013-12-07 DIAGNOSIS — Z9889 Other specified postprocedural states: Secondary | ICD-10-CM | POA: Insufficient documentation

## 2013-12-07 DIAGNOSIS — R358 Other polyuria: Secondary | ICD-10-CM | POA: Insufficient documentation

## 2013-12-07 DIAGNOSIS — Z23 Encounter for immunization: Secondary | ICD-10-CM

## 2013-12-07 DIAGNOSIS — N529 Male erectile dysfunction, unspecified: Secondary | ICD-10-CM

## 2013-12-07 DIAGNOSIS — R3589 Other polyuria: Secondary | ICD-10-CM

## 2013-12-07 DIAGNOSIS — K5792 Diverticulitis of intestine, part unspecified, without perforation or abscess without bleeding: Secondary | ICD-10-CM

## 2013-12-07 DIAGNOSIS — R300 Dysuria: Secondary | ICD-10-CM

## 2013-12-07 LAB — POCT UA - MICROSCOPIC ONLY

## 2013-12-07 LAB — GLUCOSE, CAPILLARY: Glucose-Capillary: 91 mg/dL (ref 70–99)

## 2013-12-07 LAB — POCT URINALYSIS DIPSTICK
Glucose, UA: NEGATIVE
Leukocytes, UA: NEGATIVE
Nitrite, UA: NEGATIVE
PH UA: 6.5
PROTEIN UA: 30
SPEC GRAV UA: 1.02
UROBILINOGEN UA: 1

## 2013-12-07 NOTE — Assessment & Plan Note (Signed)
Patient having reservations about talking to a male about his erectile dysfunction. Appears to be long-standing erectile dysfunction of many years. Patient is to prescribe nitroglycerin. We'll send to urology to discuss options.

## 2013-12-07 NOTE — Patient Instructions (Signed)
Kidney Stones Kidney stones (urolithiasis) are deposits that form inside your kidneys. The intense pain is caused by the stone moving through the urinary tract. When the stone moves, the ureter goes into spasm around the stone. The stone is usually passed in the urine.  CAUSES   A disorder that makes certain neck glands produce too much parathyroid hormone (primary hyperparathyroidism).  A buildup of uric acid crystals, similar to gout in your joints.  Narrowing (stricture) of the ureter.  A kidney obstruction present at birth (congenital obstruction).  Previous surgery on the kidney or ureters.  Numerous kidney infections. SYMPTOMS   Feeling sick to your stomach (nauseous).  Throwing up (vomiting).  Blood in the urine (hematuria).  Pain that usually spreads (radiates) to the groin.  Frequency or urgency of urination. DIAGNOSIS   Taking a history and physical exam.  Blood or urine tests.  CT scan.  Occasionally, an examination of the inside of the urinary bladder (cystoscopy) is performed. TREATMENT   Observation.  Increasing your fluid intake.  Extracorporeal shock wave lithotripsy--This is a noninvasive procedure that uses shock waves to break up kidney stones.  Surgery may be needed if you have severe pain or persistent obstruction. There are various surgical procedures. Most of the procedures are performed with the use of small instruments. Only small incisions are needed to accommodate these instruments, so recovery time is minimized. The size, location, and chemical composition are all important variables that will determine the proper choice of action for you. Talk to your health care provider to better understand your situation so that you will minimize the risk of injury to yourself and your kidney.  HOME CARE INSTRUCTIONS   Drink enough water and fluids to keep your urine clear or pale yellow. This will help you to pass the stone or stone fragments.  Strain  all urine through the provided strainer. Keep all particulate matter and stones for your health care provider to see. The stone causing the pain may be as small as a grain of salt. It is very important to use the strainer each and every time you pass your urine. The collection of your stone will allow your health care provider to analyze it and verify that a stone has actually passed. The stone analysis will often identify what you can do to reduce the incidence of recurrences.  Only take over-the-counter or prescription medicines for pain, discomfort, or fever as directed by your health care provider.  Make a follow-up appointment with your health care provider as directed.  Get follow-up X-rays if required. The absence of pain does not always mean that the stone has passed. It may have only stopped moving. If the urine remains completely obstructed, it can cause loss of kidney function or even complete destruction of the kidney. It is your responsibility to make sure X-rays and follow-ups are completed. Ultrasounds of the kidney can show blockages and the status of the kidney. Ultrasounds are not associated with any radiation and can be performed easily in a matter of minutes. SEEK MEDICAL CARE IF:  You experience pain that is progressive and unresponsive to any pain medicine you have been prescribed. SEEK IMMEDIATE MEDICAL CARE IF:   Pain cannot be controlled with the prescribed medicine.  You have a fever or shaking chills.  The severity or intensity of pain increases over 18 hours and is not relieved by pain medicine.  You develop a new onset of abdominal pain.  You feel faint or pass out.  You are unable to urinate. MAKE SURE YOU:   Understand these instructions.  Will watch your condition.  Will get help right away if you are not doing well or get worse. Document Released: 02/01/2005 Document Revised: 10/04/2012 Document Reviewed: 07/05/2012 [facility] Patient Information 2015  [place], [place]. This information is not intended to replace advice given to you by your health care provider. Make sure you discuss any questions you have with your health care provider.  We have made a referral for neurology, gastroenterology and oral surgery. They will call you with all the appointment set up for these referrals. Urology will feel a help you with your possible kidney stones, blood in your urine, urinary frequency and erectile dysfunction.

## 2013-12-07 NOTE — Progress Notes (Signed)
Patient ID: Patrick Holt, male   DOB: 1950-06-19, 63 y.o.   MRN: [id number]  Patrick Holt is a 63 y.o.  Caucasian male, presents to family medicine clinic for polyuria  Frequency of urination: Patient has a history of urge incontinence, and has been prescribed oxybutynin  in the past. He also has a history of diet-controlled diabetes, IBS, diverticulosis, hemorrhoids, depression with psychosis, CAD, COPD, A. fib, hypertension, hyperlipidemia, psychotic disorder, Raynaud's disease, rheumatoid arthritis and seizures. He had a PSA in 2010 which was 0.76. There is no family history of prostate cancer. Patient's last A1c was in August 2015 was 5.6, patient is diet controlled/not medicated.  IPSS score today 25. Patient answered "5" sensation of not emptying his bladder, having to void 2 hours after finishing urinating, stopped and started again several times when urinating, weak urinary stream, nocturia (which he course greater than 5 times). His quality of life he feels makes about equally satisfied and unsatisfied, concerning his urinary condition. Patient admits to history of kidney stones back in 2003 in which he states he had a "8 mm stone "and underwent a procedure to "crack it ". Patient has not had kidney stones since that he recalls. Patient had recent CT of abdomen and pelvis in August 2015 with no signs of nephrolithiasis, hydronephrosis or solid renal masses. There was evidence of renal cysts bilaterally, imaging study results below.  Patient reports compliance with his oxybutynin 5 mg 3 times a day. He states he never really felt like this helped him with his urinary condition. He has frequent complaints of lower abdominal pain, which he has been worked up extensively with recent CT at his gastroenterologist. According to him he states that they keep telling him that it is from his diverticulosis and has been treated multiple times on antibiotics for it. He reports that he is "not wetting his  pants "as much as he used to. He has increased frequency in urination when he attempts to drink even the smallest amount of liquids. He denies any burning with his urination. He denies any bowel changes.  Erectile dysfunction: In addition patient wanted to speak about erectile dysfunction, he states that he is mildly embarrassed considering this provider is a male. He states that he has not had sexual intercourse in over 14 years, but that he has met a "few ladies "they has created interest in possibly pursuing sexual intercourse again. He is uncertain of the years that he has been impotent. His diabetes is and  well controlled and he is not on medications. His hypertension is usually decently controlled. He is on aspirin, Lipitor, and amiodarone. His cholesterol is controlled on medications. He was told in the past he could not use any medications to help him with his erectile dysfunction because he is prescribe nitroglycerin for his chest pain. He states he has not used nitroglycerin in approximately 3 months.  Past Medical History  Diagnosis Date  . Rheumatoid arthritis(714.0)   . Obesity   . Major depression     with acute psychotic break in 06/2010  . Hypertension   . Hyperlipidemia   . Diverticulosis of colon   . COPD (chronic obstructive pulmonary disease)   . Anxiety   . GERD (gastroesophageal reflux disease)   . Vertigo   . Fibromyalgia   . Dermatomyositis   . Myocardial infarct     mulitple (1999, 2000, 2004)  . Raynaud's disease   . Narcotic dependence   . Peripheral neuropathy   .  Internal hemorrhoids   . Ischemic heart disease   . Hiatal hernia   . Gastritis   . Diverticulitis   . Hx of adenomatous colonic polyps   . Nephrolithiasis   . Anemia   . Esophageal stricture   . Esophageal dysmotility   . Dermatomyositis   . Urge incontinence   . Otosclerosis   . Bipolar 1 disorder   . OCD (obsessive compulsive disorder)   . Sarcoidosis   . Paroxysmal a-fib   .  Dysrhythmia     "irregular" (11/15/2012)  . Type II diabetes mellitus   . Seizures   . Headache(784.0)     "severe; get them often" (11/15/2012)  . Subarachnoid hemorrhage 01/2012    with subdural  hematoma.   . Atrial fibrillation 01/2012    with RVR  . Conversion disorder 06/2010  . History of narcotic addiction    History   Social History  . Marital Status: Divorced    Spouse Name: N/A    Number of Children: N/A  . Years of Education: N/A   Occupational History  . disabled    Social History Main Topics  . Smoking status: Current Every Day Smoker -- 0.50 packs/day for 30 years    Types: Cigarettes  . Smokeless tobacco: Never Used  . Alcohol Use: No     Comment: Alcohol stopped in September of 2014  . Drug Use: No  . Sexual Activity: No   Other Topics Concern  . Not on file   Social History Narrative  . No narrative on file     Medication List       This list is accurate as of: 12/07/13 10:25 AM.  Always use your most recent med list.               albuterol-ipratropium 18-103 MCG/ACT inhaler  Commonly known as:  COMBIVENT  Inhale 1 puff into the lungs daily.     ALPRAZolam 1 MG tablet  Commonly known as:  XANAX  Take 1 mg by mouth at bedtime as needed for anxiety.     amiodarone 200 MG tablet  Commonly known as:  PACERONE  Take 1 tablet (200 mg total) by mouth daily before breakfast.     aspirin 81 MG chewable tablet  Chew 81 mg by mouth daily.     atorvastatin 40 MG tablet  Commonly known as:  LIPITOR  TAKE 1 TABLET BY MOUTH EVERY DAY     atorvastatin 40 MG tablet  Commonly known as:  LIPITOR  TAKE 1 TABLET BY MOUTH EVERY DAY     atorvastatin 40 MG tablet  Commonly known as:  LIPITOR  Take 1 tablet (40 mg total) by mouth daily.     clindamycin 300 MG capsule  Commonly known as:  CLEOCIN  Take 300 mg by mouth 4 (four) times daily.     divalproex 500 MG 24 hr tablet  Commonly known as:  DEPAKOTE ER  Take 1 tablet (500 mg total) by mouth  daily.     HYDROcodone-acetaminophen 5-325 MG per tablet  Commonly known as:  NORCO  Take 2 tablets by mouth every 4 (four) hours as needed.     HYDROmorphone 2 MG tablet  Commonly known as:  DILAUDID  Take 1 tablet (2 mg total) by mouth every 4 (four) hours as needed for severe pain.     levETIRAcetam 500 MG tablet  Commonly known as:  KEPPRA  Take 500 mg by mouth 2 (two) times daily.  multivitamin with minerals Tabs tablet  Take 1 tablet by mouth daily.     NEXIUM 40 MG capsule  Generic drug:  esomeprazole  TAKE 1 CAPSULE BY MOUTH EVERY MORNING BEFORE BREAKFAST     ondansetron 4 MG disintegrating tablet  Commonly known as:  ZOFRAN ODT  54m ODT q4 hours prn nausea/vomit     oxybutynin 5 MG tablet  Commonly known as:  DITROPAN  TAKE 1 TABLET BY MOUTH THREE TIMES DAILY     potassium chloride SA 20 MEQ tablet  Commonly known as:  K-DUR,KLOR-CON  TAKE 1 TABLET BY MOUTH EVERY DAY     potassium chloride SA 20 MEQ tablet  Commonly known as:  K-DUR,KLOR-CON  TAKE 1 TABLET BY MOUTH EVERY DAY     rOPINIRole 2 MG tablet  Commonly known as:  REQUIP  Take 2 mg by mouth 3 (three) times daily.       KIDNEYS/ URINARY TRACT: Kidneys are orthotopic, demonstrating  symmetric enhancement. No nephrolithiasis, hydronephrosis or solid  renal masses. Stable 6.3 cm right upper pole renal cyst, additional  smaller right renal cyst. 19 mm left renal cyst. Additional too  small to characterize hypodensities bilaterally. The unopacified  ureters are normal in course and caliber. Delayed imaging through  the kidneys demonstrates symmetric prompt contrast excretion within  the proximal urinary collecting system. Urinary bladder is partially  distended and unremarkable.  Review of Systems Per history of present illness    Objective:   Physical Exam BP 152/83  Temp(Src) 98 F (36.7 C) (Oral)  Ht 5' 5" (1.651 m)  Wt 217 lb (98.431 kg)  BMI 36.11 kg/m2 Gen: Will room, Caucasian male,  no acute distress, nontoxic in appearance, well-developed, well-nourished, extremely talkative, tangential speech.  HEENT: AT. [place]. Bilateral eyes without injections or icterus. MMM.  CV: RRR, no murmurs  Chest: CTAB, no wheeze or crackles Abd: Soft.  obese, tender to palpation suprapubic and left lower quadrant ND. BS  present .  No  Masses palpated.   Muscle skeletal: No CVA tenderness

## 2013-12-07 NOTE — Assessment & Plan Note (Addendum)
Patient with complaints of frequency of urination with fluid intake, nocturia and a IPSS score of 25. Urinalysis today with calcium oxalate crystals, 10-20 red blood cells, 30 protein, small bili and trace bacteria. Patient admits that he does have a prior history of kidney stones in 2003. Reviewed CT from August 2015, it was negative for nephrolithiasis at that time. Patient does have a smoking history. POCT CBG was 91. Continue oxybutynin 5 mg 3 times a day until he sees a urologist Advised patient to attempt to increase his fluid intake (water, limiting, cranberry juice), take NSAIDs for relief if No prior contraindications, referral to urology was placed today.

## 2013-12-11 ENCOUNTER — Telehealth: Payer: Self-pay | Admitting: Family Medicine

## 2013-12-11 NOTE — Telephone Encounter (Signed)
Called and spoke with patient about his referrals. There isn't an oral surgeon that takes his insurance so Patrick Holt is going to attempt to have his insurance changed to something different at the beginning of the year. I gave him his appointment for the GI consult at which point he says he probably will not go to that one, he is more concerned with the oral surgeon and urology referral. I told him the urology referral is in process and I will call him back once I have an appointment.[name], [name]

## 2013-12-11 NOTE — Telephone Encounter (Signed)
Pt called back to let us know that [facility] said that he can go to one of these oral surgeons. Chryl Heck or [employer] and they are both at [address] and their phone number is [phone]. jw

## 2013-12-11 NOTE — Telephone Encounter (Signed)
Pt wants to know about his referrals to oral surgeon, urlogist and if Mcarthur Rossetti has approved his "medical necessity" oral surgery [phone]

## 2013-12-12 NOTE — Telephone Encounter (Signed)
Called and spoke with Patrick Holt and gave him his appointments for Oral Surgeon consult on 12/20/13 at 2 pm and [facility] on 01/07/2014 at [place], [name]

## 2013-12-14 ENCOUNTER — Telehealth: Payer: Self-pay | Admitting: Family Medicine

## 2013-12-14 NOTE — Telephone Encounter (Signed)
Pt called because he needs silverback  Referral and also that it states that this is medically necessary because then medicare might pay for all of this. Please call patient to go over all of what he is required to do. jw

## 2013-12-14 NOTE — Telephone Encounter (Signed)
Pt states that the "oral surgeon" he made an appt with for next week is not actually a oral surgeon. Pt is now requesting [place] to find him an oral surgeon ( referral made on 10/23). Please call patient with appt info.

## 2013-12-17 NOTE — Telephone Encounter (Signed)
I spoke with Patrick Holt at the oral surgeons office and they do not require a silver back referral prior to office visit, they will take care of the insurance claim after. I've also spoke with Patrick Holt and told him I was taking care of his referrals and prior auths. He is also aware of all his appointments.Patrick Holt, Patrick Holt

## 2013-12-24 ENCOUNTER — Telehealth: Payer: Self-pay | Admitting: Family Medicine

## 2013-12-24 NOTE — Telephone Encounter (Signed)
Pt called back to inform us that he is out of minutes until tomorrow 11/10. He also stated that [facility] does not tell him which doctor he would be seeing. The dentist Jodene Nam is no longer an option because he is out of network. He said he needs a oral surgeon who [place]. jw

## 2013-12-24 NOTE — Telephone Encounter (Signed)
Junious Silk a case worker is calling to get verbal orders for a home health nurse visit to assess the patient. Please call Joanie at [phone] to give verbal orders. jw

## 2013-12-24 NOTE — Telephone Encounter (Signed)
Pt called and he needs 2 silverback sent. One to [facility] and the other one to [facility] dentist office number [phone]. Please call pt when and if you need more information. jw

## 2013-12-25 ENCOUNTER — Telehealth: Payer: Self-pay | Admitting: Family Medicine

## 2013-12-25 NOTE — Telephone Encounter (Signed)
Ma Hillock, DO at 12/25/2013 8:41 AM     Status: Signed       Expand All Collapse All   Clarise Cruz please call the number provided for social work and give verbal order that was requested. Thanks.      LVM for Joanie to call back to give verbal ok per Dr. Raoul Pitch

## 2013-12-25 NOTE — Telephone Encounter (Signed)
Spoke with [employer] and gave verbal orders.

## 2013-12-25 NOTE — Telephone Encounter (Signed)
Patrick Holt please call the number provided for social work and give verbal order that was requested. Thanks.

## 2013-12-25 NOTE — Telephone Encounter (Signed)
             LVM for Joanie to call back to give verbal ok for health nurse visit to assess the patient

## 2013-12-25 NOTE — Telephone Encounter (Signed)
Patrick Holt (dont know who this is) told pt he needs to get [name] to call an Chief Financial Officer and tell them he needs to get his teeth pulled and it is a medical neccesity.  Then call [phone] to get the [facility] authorization Also he said he thought he was having a stroke. I advised him to call 911 and get to the hospital

## 2013-12-26 NOTE — Telephone Encounter (Signed)
Attempted to call patient's oral surgeon, their office was closed for the day. I will try again tomorrow and contact the patient.[name], [name]

## 2013-12-31 ENCOUNTER — Telehealth: Payer: Self-pay | Admitting: Family Medicine

## 2013-12-31 NOTE — Telephone Encounter (Signed)
Otis Peak is the case Freight forwarder with [place]. Who is going to make the arrangements for pt to have home health? Please advise

## 2013-12-31 NOTE — Telephone Encounter (Signed)
Pt called and would like DR. Raoul Pitch to write a letter to Dr. Diona Browner stating that it is medically necessary for him to be in the hospital to have his teeth removed. Dr. Hoyt Koch wants to do this in a hospital setting and the patient has no problem with this but his insurance needs this letter to cover the expenses saying that it is medically necessary and then he would only have to pay 250.00 for the whole procedure. Please fax this letter to Dr. Royce Macadamia office. jw

## 2014-01-01 NOTE — Telephone Encounter (Signed)
Pt is calling asking for a letter from his PCP stating his teeth extraction is medically necessary to be performed in the hospitial setting.Please call Dr. Royce Macadamia office and have them fax over confirmation of request  and reasoning they are requesting teeth extraction to be performed in the hospital setting.  Thanks.

## 2014-01-02 NOTE — Telephone Encounter (Signed)
Called and spoke with Dr Lupita Leash office, they faxed over a medical release form to be filled out yesterday. They said it was more his request to have this done in the hospital, not Dr Lupita Leash. Unless you feel this should be done in the hospital, it will be performed in their office once he has been cleared medically. If you do not have the form let me know and they will fax another copy.[name], Patrick Holt

## 2014-01-02 NOTE — Telephone Encounter (Signed)
Pt called again about the letter of medical necessity.  His teeth are killing him and he needs this done ASAP. Please call pt when this is done

## 2014-01-03 ENCOUNTER — Inpatient Hospital Stay (HOSPITAL_COMMUNITY)
Admission: EM | Admit: 2014-01-03 | Discharge: 2014-01-06 | DRG: 642 | Disposition: A | Discharge status: Home / Self Care | Payer: Medicare HMO | Attending: Family Medicine | Admitting: Family Medicine

## 2014-01-03 ENCOUNTER — Emergency Department (HOSPITAL_COMMUNITY): Payer: Medicare HMO

## 2014-01-03 DIAGNOSIS — Z9849 Cataract extraction status, unspecified eye: Secondary | ICD-10-CM

## 2014-01-03 DIAGNOSIS — Z86010 Personal history of colonic polyps: Secondary | ICD-10-CM

## 2014-01-03 DIAGNOSIS — I7300 Raynaud's syndrome without gangrene: Secondary | ICD-10-CM | POA: Diagnosis present

## 2014-01-03 DIAGNOSIS — G8191 Hemiplegia, unspecified affecting right dominant side: Secondary | ICD-10-CM | POA: Diagnosis present

## 2014-01-03 DIAGNOSIS — C8190 Hodgkin lymphoma, unspecified, unspecified site: Secondary | ICD-10-CM | POA: Diagnosis present

## 2014-01-03 DIAGNOSIS — M5412 Radiculopathy, cervical region: Secondary | ICD-10-CM | POA: Insufficient documentation

## 2014-01-03 DIAGNOSIS — I481 Persistent atrial fibrillation: Secondary | ICD-10-CM | POA: Diagnosis present

## 2014-01-03 DIAGNOSIS — R531 Weakness: Principal | ICD-10-CM | POA: Diagnosis present

## 2014-01-03 DIAGNOSIS — K219 Gastro-esophageal reflux disease without esophagitis: Secondary | ICD-10-CM | POA: Diagnosis present

## 2014-01-03 DIAGNOSIS — M797 Fibromyalgia: Secondary | ICD-10-CM | POA: Diagnosis present

## 2014-01-03 DIAGNOSIS — F17210 Nicotine dependence, cigarettes, uncomplicated: Secondary | ICD-10-CM | POA: Diagnosis present

## 2014-01-03 DIAGNOSIS — D869 Sarcoidosis, unspecified: Secondary | ICD-10-CM | POA: Diagnosis present

## 2014-01-03 DIAGNOSIS — G40309 Generalized idiopathic epilepsy and epileptic syndromes, not intractable, without status epilepticus: Secondary | ICD-10-CM | POA: Diagnosis present

## 2014-01-03 DIAGNOSIS — R29898 Other symptoms and signs involving the musculoskeletal system: Secondary | ICD-10-CM

## 2014-01-03 DIAGNOSIS — E119 Type 2 diabetes mellitus without complications: Secondary | ICD-10-CM | POA: Insufficient documentation

## 2014-01-03 DIAGNOSIS — H5441 Blindness, right eye, normal vision left eye: Secondary | ICD-10-CM | POA: Diagnosis present

## 2014-01-03 DIAGNOSIS — I252 Old myocardial infarction: Secondary | ICD-10-CM

## 2014-01-03 DIAGNOSIS — Z6835 Body mass index (BMI) 35.0-35.9, adult: Secondary | ICD-10-CM

## 2014-01-03 DIAGNOSIS — F449 Dissociative and conversion disorder, unspecified: Secondary | ICD-10-CM | POA: Diagnosis present

## 2014-01-03 DIAGNOSIS — E669 Obesity, unspecified: Secondary | ICD-10-CM | POA: Diagnosis present

## 2014-01-03 DIAGNOSIS — Z87442 Personal history of urinary calculi: Secondary | ICD-10-CM

## 2014-01-03 DIAGNOSIS — J449 Chronic obstructive pulmonary disease, unspecified: Secondary | ICD-10-CM | POA: Diagnosis present

## 2014-01-03 DIAGNOSIS — R200 Anesthesia of skin: Secondary | ICD-10-CM | POA: Insufficient documentation

## 2014-01-03 DIAGNOSIS — Z6841 Body Mass Index (BMI) 40.0 and over, adult: Secondary | ICD-10-CM

## 2014-01-03 DIAGNOSIS — R350 Frequency of micturition: Secondary | ICD-10-CM | POA: Diagnosis present

## 2014-01-03 DIAGNOSIS — F319 Bipolar disorder, unspecified: Secondary | ICD-10-CM | POA: Diagnosis present

## 2014-01-03 DIAGNOSIS — Z8673 Personal history of transient ischemic attack (TIA), and cerebral infarction without residual deficits: Secondary | ICD-10-CM

## 2014-01-03 DIAGNOSIS — R079 Chest pain, unspecified: Secondary | ICD-10-CM

## 2014-01-03 DIAGNOSIS — F42 Obsessive-compulsive disorder: Secondary | ICD-10-CM | POA: Diagnosis present

## 2014-01-03 DIAGNOSIS — Z7982 Long term (current) use of aspirin: Secondary | ICD-10-CM

## 2014-01-03 DIAGNOSIS — I2510 Atherosclerotic heart disease of native coronary artery without angina pectoris: Secondary | ICD-10-CM | POA: Diagnosis present

## 2014-01-03 DIAGNOSIS — I4891 Unspecified atrial fibrillation: Secondary | ICD-10-CM

## 2014-01-03 DIAGNOSIS — F1120 Opioid dependence, uncomplicated: Secondary | ICD-10-CM | POA: Diagnosis present

## 2014-01-03 DIAGNOSIS — E785 Hyperlipidemia, unspecified: Secondary | ICD-10-CM | POA: Diagnosis present

## 2014-01-03 DIAGNOSIS — F419 Anxiety disorder, unspecified: Secondary | ICD-10-CM | POA: Diagnosis present

## 2014-01-03 DIAGNOSIS — I480 Paroxysmal atrial fibrillation: Secondary | ICD-10-CM | POA: Diagnosis present

## 2014-01-03 DIAGNOSIS — M069 Rheumatoid arthritis, unspecified: Secondary | ICD-10-CM | POA: Diagnosis present

## 2014-01-03 DIAGNOSIS — G629 Polyneuropathy, unspecified: Secondary | ICD-10-CM | POA: Diagnosis present

## 2014-01-03 DIAGNOSIS — I5020 Unspecified systolic (congestive) heart failure: Secondary | ICD-10-CM | POA: Diagnosis present

## 2014-01-03 DIAGNOSIS — I10 Essential (primary) hypertension: Secondary | ICD-10-CM | POA: Insufficient documentation

## 2014-01-03 LAB — CBC WITH DIFFERENTIAL/PLATELET
BASOS ABS: 0 10*3/uL (ref 0.0–0.1)
Basophils Relative: 0 % (ref 0–1)
Eosinophils Absolute: 0.3 10*3/uL (ref 0.0–0.7)
Eosinophils Relative: 4 % (ref 0–5)
HCT: 44.9 % (ref 39.0–52.0)
Hemoglobin: 14.8 g/dL (ref 13.0–17.0)
LYMPHS ABS: 2.3 10*3/uL (ref 0.7–4.0)
Lymphocytes Relative: 34 % (ref 12–46)
MCH: 30.4 pg (ref 26.0–34.0)
MCHC: 33 g/dL (ref 30.0–36.0)
MCV: 92.2 fL (ref 78.0–100.0)
MONO ABS: 0.6 10*3/uL (ref 0.1–1.0)
MONOS PCT: 9 % (ref 3–12)
NEUTROS ABS: 3.7 10*3/uL (ref 1.7–7.7)
Neutrophils Relative %: 53 % (ref 43–77)
Platelets: 262 10*3/uL (ref 150–400)
RBC: 4.87 MIL/uL (ref 4.22–5.81)
RDW: 14.2 % (ref 11.5–15.5)
WBC: 6.9 10*3/uL (ref 4.0–10.5)

## 2014-01-03 LAB — COMPREHENSIVE METABOLIC PANEL
ALBUMIN: 3.5 g/dL (ref 3.5–5.2)
ALT: 19 U/L (ref 0–53)
ANION GAP: 14 (ref 5–15)
AST: 25 U/L (ref 0–37)
Alkaline Phosphatase: 81 U/L (ref 39–117)
BILIRUBIN TOTAL: 0.3 mg/dL (ref 0.3–1.2)
BUN: 13 mg/dL (ref 6–23)
CALCIUM: 9 mg/dL (ref 8.4–10.5)
CO2: 23 mEq/L (ref 19–32)
Chloride: 106 mEq/L (ref 96–112)
Creatinine, Ser: 1.01 mg/dL (ref 0.50–1.35)
GFR calc Af Amer: 89 mL/min — ABNORMAL LOW (ref >90)
GFR calc non Af Amer: 77 mL/min — ABNORMAL LOW (ref >90)
Glucose, Bld: 128 mg/dL — ABNORMAL HIGH (ref 70–99)
Potassium: 4.1 mEq/L (ref 3.7–5.3)
Sodium: 143 mEq/L (ref 137–147)
Total Protein: 7 g/dL (ref 6.0–8.3)

## 2014-01-03 LAB — VALPROIC ACID LEVEL: Valproic Acid Lvl: 21.8 ug/mL — ABNORMAL LOW (ref 50.0–100.0)

## 2014-01-03 LAB — I-STAT TROPONIN, ED: TROPONIN I, POC: 0 ng/mL (ref 0.00–0.08)

## 2014-01-03 MED ORDER — SODIUM CHLORIDE 0.9 % IV BOLUS (SEPSIS)
1000.0000 mL | Freq: Once | INTRAVENOUS | Status: AC
  Administered 2014-01-03: 1000 mL via INTRAVENOUS

## 2014-01-03 MED ORDER — HYDROMORPHONE HCL 1 MG/ML IJ SOLN
1.0000 mg | Freq: Once | INTRAMUSCULAR | Status: AC
  Administered 2014-01-03: 1 mg via INTRAVENOUS
  Filled 2014-01-03: qty 1

## 2014-01-03 NOTE — Telephone Encounter (Signed)
I have yet to receive a fax from Dr. Hoyt Koch. Thanks.

## 2014-01-03 NOTE — Telephone Encounter (Signed)
Called Dr Lupita Leash office again today, they now state they do not need a medical clearance form from [place], it will need to come from his cardiologist which they have faxed over.[name], [name]

## 2014-01-03 NOTE — H&P (Signed)
[facility] Admission History and Physical Service Pager: [phone]  Patient name: Patrick Holt Medical record number: [id number] Date of birth: [birth year] Age: 63 y.o. Gender: male  Primary Care Provider: Howard Pouch, DO Consultants: Neurology Code Status: Full per discussion on admission  Chief Complaint: Right sided weakness  Assessment and Plan: Patrick Holt is a 63 y.o. male presenting with right sided weakness x 3 weeks with new onset left sided numbness. PMH is significant for COPD, CAD, afib, HTN, HLD, seizure disorder (possibly in the setting of Xanax withdraw), dermatomyositis,  RA, subdural or subarachnoid hemorrhages noted on MRI in 2013.   #Right sided weakness: Ddx includes both ischemic stroke or cervical spine disease. CT of head without any acute changes and cervical spine CT unremarkable. The patient does have weakness on the R>L in the upper and lower extremities. His right pupil does not dilate, however this is baseline as he has a h/o R eye blindness. EKG was normal sinus rhythm without sings of ischemia or infarct. ISTAT troponin was negative. Patient does have a h/o afib and is not on anticoagulation which does put him at risk. CHADVASC score is 3 (HTN, DM, h/o MI) which is high risk-3.2%- (even if you consider his DM resolved as he's been controlled so well without medications, his risk is still elevated at 2.2%).  Neurology was consulted in the ED and felt the patient should have a stroke work-up. - admit to [facility], attending Dr. Andria Frames - monitor on telemetry given h/o afib - f/u risk stratification labs - ordered MRI - ordered echocardiogram  - ordered carotid dopplers  - continue ASA 81mg   - neuro checks q2hrs - neurology following, appreciate recs - consult PT/OT/Speech  - NPO until he passes bedside swallow evaluation  #Chest pain: Per patient, present intermittently for some time now. EKG in the ED without ischemia and  istat troponin negative. Known hx of CAD with prior MI's. History limited by patient's perseverative and tangential speech.  - Repeat EKG in AM - Trend troponins  #Atrial fibrillation, CAD: Nuclear stress test in 02/2013 revealed a  fixed defect in the basal inferior and inferolateral walls that might represent an old infarct or diaphragmatic attenuation with an otherwise normal perfusion. The patient is not on anticoagulation due to bleed history.  - in NSR in the ED - Continue home ASA  - continue amiodarone - monitor on telemetry  #COPD: Patient endorses occasional productive cough, however appears to be at his baseline. CXR with no acute changes. Patient will not stop smoking as doctors at [facility] told him to start smoking so that his "lungs would be covered with tar and he'd be protected against viruses and bacteria."  - consider restart of long-acting inhaler (pt reportedly not taking combivent)  #Neuro/Psych: Patient well maintained on Depakote, Keppra and Xanax. Patient with a questionable history of seizure disorder, he has had seizures in the past but it was in the setting of Xanax withdraw. Valproic acid level decreased on presentation. - continue home Depakote, Keppra, and Xanax - continue Requip   #HTN: Patient not on an antihypertensive currently with stable BPs. Was prescribed lisinopril in the past but discontinued it due to diarrhea.  - continue to monitor  #HLD: - Continue home Lipitor  - f/u lipid panel   #Diabetes mellitus: currently well controlled without medication (was previously on glipizide). Last A1c was 5.6 - f/u A1c  #Substance abuse: Patient smokes ~1/2ppd. Additionally, per the EMR he  has a h/o alcohol abuse. Patient states he does not drink at all any more. -Patient allergic to nicotine patch  -place on CIWA protocol  #Urinary frequency: - Continue oxybutynin  -check UA  FEN/GI: NPO until bedside swallow eval, Protonix, NS @100cc /hr Prophylaxis:  SCDs given h/o bleed  Disposition: Admit to telemetry for stroke work up, attending Dr. Andria Frames   History of Present Illness: Patrick Holt is a 63 y.o. male presenting with 3 weeks of right sided weakness. He states that 3 weeks ago he noted numbness in his right arm along with difficulty closing his R hand. This then progressed to pain radiating up his R arm, into his neck, and into his temple.  Tonight, the pain in the right neck got really bad. He also noted that his left arm has now become numb and he's had difficulty closing his left hand. He has periodic shooting pains down the L leg and his chest. Difficulties with memory (chronic since last year). He has notes dribbling when he drinks but denies any difficulties with swallowing or feelings of choking. Denies any history of trauma. At baseline he does have weakness due to dermatomyositis, often in a wheelchair or using a cane.   He's had intermittent chest pain for "quite some time now." Has had chest pain while he's been in the ED but denies pain currently. Endorses a productive cough. States he sometimes has SOB with his cough. Does not check his temperature. Is tolerating PO well. At baseline he is in a wheelchair.   History limited by pt's perseverative and tangential speech. He requires frequent redirection.  Review Of Systems: Per HPI with the following additions: Teeth breaking off into his gums (he's attempting to get set up with Dr. Hoyt Koch to have oral surgery done in the hospital setting), polyuria  Otherwise 12 point review of systems was performed and was unremarkable.  Patient Active Problem List   Diagnosis Date Noted  . Polyuria 12/07/2013  . History of oral surgery 12/07/2013  . Erectile dysfunction 12/07/2013  . Diverticulitis 08/01/2013  . Acute diverticulitis 08/01/2013  . Ankle pain, right 06/20/2013  . Hematochezia 06/04/2013  . Pain in joint, lower leg 12/08/2012  . Dehydration 11/09/2012  . Contact  dermatitis 11/09/2012  . Hypokalemia 10/03/2012  . Benign neoplasm of colon 09/27/2012  . Stricture and stenosis of esophagus 09/27/2012  . Diverticulitis of colon without hemorrhage 07/26/2012  . Polypharmacy 03/26/2012  . Thought disorder October 18, 202014    Class: Chronic  . 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 Psychotic disorder 02/14/2012  . Seizures 02/10/2012  . Atrial fibrillation 02/05/2012  . Coronary atherosclerosis of native coronary artery 02/05/2012  . High risk social situation 10/15/2011  . Dental attrition, excessive 03/10/2011  . Radicular low back pain 03/09/2011  . History of seizure disorder 01/29/2011  . Irritable bowel syndrome (IBS) 06/24/2010  . Urge incontinence 06/19/2010  . TOBACCO USER 11/23/2008  . DEPRESSION, MAJOR 05/31/2008  . ISCHEMIC HEART DISEASE 05/31/2008  . RAYNAUD'S DISEASE 05/31/2008  . HIATAL HERNIA 05/31/2008  . Rheumatoid arthritis(714.0) 05/31/2008  . FIBROMYALGIA 05/31/2008  . Generalized pain 05/31/2008  . DIABETES MELLITUS, TYPE II 01/26/2008  . HYPERLIPIDEMIA 01/26/2008  . ANXIETY, CHRONIC 01/26/2008  . HYPERTENSION 01/26/2008  . HEMORRHOIDS, INTERNAL 01/26/2008  . COPD 01/26/2008  . ESOPHAGEAL MOTILITY DISORDER 01/26/2008  . GERD 01/26/2008  . DIVERTICULOSIS OF COLON 01/26/2008  . Dermatomyositis 01/26/2008   Past Medical History: Past Medical History  Diagnosis Date  . Rheumatoid arthritis(714.0)   . Obesity   . Major depression     with acute psychotic break in 06/2010  . Hypertension   . Hyperlipidemia   . Diverticulosis of colon   . COPD (chronic obstructive pulmonary disease)   . Anxiety   . GERD (gastroesophageal reflux disease)   . Vertigo   . Fibromyalgia   . Dermatomyositis   . Myocardial infarct     mulitple (1999, 2000, 2004)  . Raynaud's disease   . Narcotic dependence   . Peripheral neuropathy   . Internal hemorrhoids   . Ischemic heart disease   . Hiatal hernia   .  Gastritis   . Diverticulitis   . Hx of adenomatous colonic polyps   . Nephrolithiasis   . Anemia   . Esophageal stricture   . Esophageal dysmotility   . Dermatomyositis   . Urge incontinence   . Otosclerosis   . Bipolar 1 disorder   . OCD (obsessive compulsive disorder)   . Sarcoidosis   . Paroxysmal a-fib   . Dysrhythmia     "irregular" (11/15/2012)  . Type II diabetes mellitus   . Seizures   . Headache(784.0)     "severe; get them often" (11/15/2012)  . Subarachnoid hemorrhage 01/2012    with subdural  hematoma.   . Atrial fibrillation 01/2012    with RVR  . Conversion disorder 06/2010  . History of narcotic addiction    Past Surgical History: Past Surgical History  Procedure Laterality Date  . Knee arthroscopy w/ meniscal repair Left 2009  . Lumbar disc surgery    . Squamous papilloma   2010    removed by Dr. Constance Holster ENT, noted on tongue  . Esophagogastroduodenoscopy N/A 09/27/2012    Procedure: ESOPHAGOGASTRODUODENOSCOPY (EGD);  Surgeon: Lafayette Dragon, MD;  Location: [facility];  Service: Endoscopy;  Laterality: N/A;  . Colonoscopy N/A 09/27/2012    Procedure: COLONOSCOPY;  Surgeon: Lafayette Dragon, MD;  Location: [facility];  Service: Endoscopy;  Laterality: N/A;  . Cataract extraction w/ intraocular lens implant Left   . Lymph node dissection Right 1970's    "neck; dr thought I had Hodgkins; turned out to be sarcoidosis" (11/15/2012)  . Tonsillectomy    . Carpal tunnel release Bilateral   . Cardiac catheterization     Social History: History  Substance Use Topics  . Smoking status: Current Every Day Smoker -- 0.50 packs/day for 30 years    Types: Cigarettes  . Smokeless tobacco: Never Used  . Alcohol Use: No     Comment: Alcohol stopped in September of 2014   Additional social history: Difficult to ascertain how much he is truly smoking now but it sounds like 0.5ppd. Denies drinking alcohol  Please also refer to relevant sections of EMR.  Family  History: Family History  Problem Relation Age of Onset  . Alcohol abuse Mother   . Depression Mother   . Heart disease Mother   . Diabetes Mother   . Stroke Mother   . Diabetes Other     1/2 brother  . Hepatitis Brother    Allergies and Medications: Allergies  Allergen Reactions  . Immune Globulins Other (See Comments)    Acute renal failure  . Ciprofloxacin Swelling  . Flagyl [Metronidazole] Swelling  . Lisinopril Diarrhea  . Sulfa Antibiotics Other (See Comments)    blisters  . Betamethasone Dipropionate     Unknown  . Bupropion Hcl     Unknown  .  Clobetasol     Unknown  . Codeine     Unknown  . Escitalopram Oxalate     Unknown  . Fluoxetine Hcl     Unknown  . Fluticasone-Salmeterol     Unknown  . Furosemide     Unknown  . Paroxetine     Unknown  . Penicillins     Unknown  . Tacrolimus     Unknown  . Tetanus Toxoid     Unknown  . Tuberculin Purified Protein Derivative     Unknown   No current facility-administered medications on file prior to encounter.   Current Outpatient Prescriptions on File Prior to Encounter  Medication Sig Dispense Refill  . ALPRAZolam (XANAX) 1 MG tablet Take 1 mg by mouth daily.     Marland Kitchen amiodarone (PACERONE) 200 MG tablet Take 1 tablet (200 mg total) by mouth daily before breakfast. 90 tablet 1  . aspirin 81 MG chewable tablet Chew 81 mg by mouth daily.    . divalproex (DEPAKOTE ER) 500 MG 24 hr tablet Take 1 tablet (500 mg total) by mouth daily. 60 tablet 2  . levETIRAcetam (KEPPRA) 500 MG tablet Take 500 mg by mouth 2 (two) times daily.     . Multiple Vitamin (MULTIVITAMIN WITH MINERALS) TABS tablet Take 1 tablet by mouth daily. 90 tablet 3  . NEXIUM 40 MG capsule TAKE 1 CAPSULE BY MOUTH EVERY MORNING BEFORE BREAKFAST 90 capsule 0  . oxybutynin (DITROPAN) 5 MG tablet TAKE 1 TABLET BY MOUTH THREE TIMES DAILY 270 tablet 2  . potassium chloride SA (K-DUR,KLOR-CON) 20 MEQ tablet TAKE 1 TABLET BY MOUTH EVERY DAY 90 tablet 0  .  rOPINIRole (REQUIP) 2 MG tablet Take 2 mg by mouth 3 (three) times daily.    Marland Kitchen albuterol-ipratropium (COMBIVENT) 18-103 MCG/ACT inhaler Inhale 1 puff into the lungs daily. 14.7 g 5  . atorvastatin (LIPITOR) 40 MG tablet Take 1 tablet (40 mg total) by mouth daily. 90 tablet 1  . HYDROcodone-acetaminophen (NORCO) 5-325 MG per tablet Take 2 tablets by mouth every 4 (four) hours as needed. 10 tablet 0  . HYDROmorphone (DILAUDID) 2 MG tablet Take 1 tablet (2 mg total) by mouth every 4 (four) hours as needed for severe pain. 30 tablet 0  . ondansetron (ZOFRAN ODT) 4 MG disintegrating tablet 4mg  ODT q4 hours prn nausea/vomit 4 tablet 0  . [DISCONTINUED] oxybutynin (DITROPAN) 5 MG tablet Take 1 tablet (5 mg total) by mouth 3 (three) times daily. 90 tablet 3    Objective: BP 138/62 mmHg  Pulse 63  Temp(Src) 98.3 F (36.8 C) (Oral)  Resp 27  Ht 5\' 5"  (1.651 m)  Wt 216 lb (97.977 kg)  BMI 35.94 kg/m2  SpO2 94% Exam: General: 63 y/o male lying in bed in NAD HEENT: Atraumatic. L pupil round and reactive to light. R pupil not reactive to light (chronic). EOMI without nystagmus. Dry MM. Very poor dentition with only a few small broken teeth on the bottom.  Cardiovascular: Bradycardic. Regular rhythm. No m/r/g noted. Trace pitting edema b/l. 2+ DP and radial pulses bilaterally. No JVD noted. No carotid bruits noted Respiratory: No increased WOB. Mild wheezing noted bilaterally. No crackles or rhonchi noted. Abdomen: +BS, soft, non-distended. Stated tenderness to palpation over the right upper quadrant (however later was not bothered with palpation while he was talking). No rebound or guarding Extremities: No gross deformities noted Skin: No rashes noted Neuro: A&Ox4. Speech clear and coherent. Smile symmetric. Face symmetric. Tongue midline. Sensation  decreased to light touch over the right side of the face. 3/5 dorsiflexion, plantar flexion, and hip flexion on the right. 4/5 dorsiflexion, plantar  flexion, and hip flexion on the left. Grip, shoulder shrug, and elbow extension 3/5 on R, 4/5 on L. EOMI.  Labs and Imaging: CBC BMET   Recent Labs Lab 01/03/14 2121  WBC 6.9  HGB 14.8  HCT 44.9  PLT 262    Recent Labs Lab 01/03/14 2121  NA 143  K 4.1  CL 106  CO2 23  BUN 13  CREATININE 1.01  GLUCOSE 128*  CALCIUM 9.0     EKG: NSR, HR 64. No ST elevation or depression. No significant change since previous EKG  CXR: No active cardiopulmonary disease.   CT head/ Cervical spine: No acute intracranial abnormalities. Chronic atrophy and small vessel ischemic changes. Normal alignment of the cervical spine. Degenerative changes. Old fracture of the T1 spinous process. No acute displaced fractures appreciated.  Archie Patten, MD 01/03/2014, 11:09 PM PGY-1, [place] [facility] pager: [phone], text pages welcome  Upper Level Addendum:  I have seen and evaluated this patient along with Dr. Lorenso Courier and reviewed the above note, making necessary revisions in pink.   Chrisandra Netters, MD Family Medicine PGY-3

## 2014-01-03 NOTE — ED Notes (Signed)
Patient returned from X-ray 

## 2014-01-03 NOTE — ED Notes (Signed)
Admitting MD at bedside.

## 2014-01-03 NOTE — ED Provider Notes (Signed)
CSN: [id number]     Arrival date & time 01/03/14  2028 History   First MD Initiated Contact with Patient 01/03/14 2113     Chief Complaint  Patient presents with  . Neck Pain  . Arm Pain  . Chest Pain     (Consider location/radiation/quality/duration/timing/severity/associated sxs/prior Treatment) The history is provided by the patient.  Patrick Holt is a 63 y.o. male hx of HL, HTN, MI here with weakness. He has been having right-sided weakness for the last 3 weeks. He states that it's hard for him to grab anything with his right hand. Also has some right neck pain. Some intermittent chest pain for 3 weeks as well. He also has some right leg pain as well. He is still smoking but denies any worsening shortness of breath. Claims that he called EMS 3 weeks ago but they didn't bring him. Brought in by EMS today.    Past Medical History  Diagnosis Date  . Rheumatoid arthritis(714.0)   . Obesity   . Major depression     with acute psychotic break in 06/2010  . Hypertension   . Hyperlipidemia   . Diverticulosis of colon   . COPD (chronic obstructive pulmonary disease)   . Anxiety   . GERD (gastroesophageal reflux disease)   . Vertigo   . Fibromyalgia   . Dermatomyositis   . Myocardial infarct     mulitple (1999, 2000, 2004)  . Raynaud's disease   . Narcotic dependence   . Peripheral neuropathy   . Internal hemorrhoids   . Ischemic heart disease   . Hiatal hernia   . Gastritis   . Diverticulitis   . Hx of adenomatous colonic polyps   . Nephrolithiasis   . Anemia   . Esophageal stricture   . Esophageal dysmotility   . Dermatomyositis   . Urge incontinence   . Otosclerosis   . Bipolar 1 disorder   . OCD (obsessive compulsive disorder)   . Sarcoidosis   . Paroxysmal a-fib   . Dysrhythmia     "irregular" (11/15/2012)  . Type II diabetes mellitus   . Seizures   . Headache(784.0)     "severe; get them often" (11/15/2012)  . Subarachnoid hemorrhage 01/2012    with  subdural  hematoma.   . Atrial fibrillation 01/2012    with RVR  . Conversion disorder 06/2010  . History of narcotic addiction    Past Surgical History  Procedure Laterality Date  . Knee arthroscopy w/ meniscal repair Left 2009  . Lumbar disc surgery    . Squamous papilloma   2010    removed by Dr. Constance Holster ENT, noted on tongue  . Esophagogastroduodenoscopy N/A 09/27/2012    Procedure: ESOPHAGOGASTRODUODENOSCOPY (EGD);  Surgeon: Lafayette Dragon, MD;  Location: [facility];  Service: Endoscopy;  Laterality: N/A;  . Colonoscopy N/A 09/27/2012    Procedure: COLONOSCOPY;  Surgeon: Lafayette Dragon, MD;  Location: [facility];  Service: Endoscopy;  Laterality: N/A;  . Cataract extraction w/ intraocular lens implant Left   . Lymph node dissection Right 1970's    "neck; dr thought I had Hodgkins; turned out to be sarcoidosis" (11/15/2012)  . Tonsillectomy    . Carpal tunnel release Bilateral   . Cardiac catheterization     Family History  Problem Relation Age of Onset  . Alcohol abuse Mother   . Depression Mother   . Heart disease Mother   . Diabetes Mother   . Stroke Mother   . Diabetes Other  1/2 brother  . Hepatitis Brother    History  Substance Use Topics  . Smoking status: Current Every Day Smoker -- 0.50 packs/day for 30 years    Types: Cigarettes  . Smokeless tobacco: Never Used  . Alcohol Use: No     Comment: Alcohol stopped in September of 2014    Review of Systems  Cardiovascular: Positive for chest pain.  Musculoskeletal: Positive for neck pain.  Neurological: Positive for weakness.  All other systems reviewed and are negative.     Allergies  Immune globulins; Ciprofloxacin; Flagyl; Lisinopril; Sulfa antibiotics; Betamethasone dipropionate; Bupropion hcl; Clobetasol; Codeine; Escitalopram oxalate; Fluoxetine hcl; Fluticasone-salmeterol; Furosemide; Paroxetine; Penicillins; Tacrolimus; Tetanus toxoid; and Tuberculin purified protein derivative  Home Medications    Prior to Admission medications   Medication Sig Start Date End Date Taking? Authorizing Provider  ALPRAZolam Duanne Moron) 1 MG tablet Take 1 mg by mouth daily.    Yes Historical Provider, MD  amiodarone (PACERONE) 200 MG tablet Take 1 tablet (200 mg total) by mouth daily before breakfast. 06/20/13  Yes Gerda Diss, DO  aspirin 81 MG chewable tablet Chew 81 mg by mouth daily.   Yes Historical Provider, MD  divalproex (DEPAKOTE ER) 500 MG 24 hr tablet Take 1 tablet (500 mg total) by mouth daily. 06/20/13  Yes Gerda Diss, DO  esomeprazole (NEXIUM) 40 MG capsule Take 40 mg by mouth daily at 12 noon.   Yes Historical Provider, MD  levETIRAcetam (KEPPRA) 500 MG tablet Take 500 mg by mouth 2 (two) times daily.  01/10/13  Yes Gerda Diss, DO  Multiple Vitamin (MULTIVITAMIN WITH MINERALS) TABS tablet Take 1 tablet by mouth daily. 12/29/12  Yes Kandis Nab, MD  NEXIUM 40 MG capsule TAKE 1 CAPSULE BY MOUTH EVERY MORNING BEFORE BREAKFAST 11/19/13  Yes Renee A Kuneff, DO  oxybutynin (DITROPAN) 5 MG tablet TAKE 1 TABLET BY MOUTH THREE TIMES DAILY 10/18/13  Yes Renee A Kuneff, DO  potassium chloride SA (K-DUR,KLOR-CON) 20 MEQ tablet TAKE 1 TABLET BY MOUTH EVERY DAY 10/18/13  Yes Renee A Kuneff, DO  rOPINIRole (REQUIP) 2 MG tablet Take 2 mg by mouth 3 (three) times daily.   Yes Historical Provider, MD  albuterol-ipratropium (COMBIVENT) 18-103 MCG/ACT inhaler Inhale 1 puff into the lungs daily. 06/20/13   Gerda Diss, DO  atorvastatin (LIPITOR) 40 MG tablet Take 1 tablet (40 mg total) by mouth daily. 10/25/13   Renee A Kuneff, DO  HYDROcodone-acetaminophen (NORCO) 5-325 MG per tablet Take 2 tablets by mouth every 4 (four) hours as needed. 10/09/13   Mariea Clonts, MD  HYDROmorphone (DILAUDID) 2 MG tablet Take 1 tablet (2 mg total) by mouth every 4 (four) hours as needed for severe pain. 08/03/13   Patrecia Pour, MD  ondansetron (ZOFRAN ODT) 4 MG disintegrating tablet 4mg  ODT q4 hours prn nausea/vomit 10/09/13    Mariea Clonts, MD   BP 138/62 mmHg  Pulse 63  Temp(Src) 98.3 F (36.8 C) (Oral)  Resp 27  Ht 5\' 5"  (1.651 m)  Wt 216 lb (97.977 kg)  BMI 35.94 kg/m2  SpO2 94% Physical Exam  Constitutional: He is oriented to person, place, and time. He appears well-nourished.  Chronically ill   HENT:  Head: Normocephalic.  Mouth/Throat: Oropharynx is clear and moist.  Eyes: Conjunctivae are normal. Pupils are equal, round, and reactive to light.  Neck: Normal range of motion. Neck supple.  Cardiovascular: Normal rate, regular rhythm and normal heart sounds.   Pulmonary/Chest:  Mild diffuse wheezing, no retractions   Abdominal: Soft. Bowel sounds are normal. He exhibits no distension. There is no tenderness. There is no rebound and no guarding.  Musculoskeletal: Normal range of motion. He exhibits no edema or tenderness.  Neurological: He is alert and oriented to person, place, and time.  ? L facial droop. CN otherwise intact. Strength 4/5 R side, 5/5 L side   Skin: Skin is dry.  Psychiatric: He has a normal mood and affect. His behavior is normal. Judgment and thought content normal.  Nursing note and vitals reviewed.   ED Course  Procedures (including critical care time) Labs Review Labs Reviewed  COMPREHENSIVE METABOLIC PANEL - Abnormal; Notable for the following:    Glucose, Bld 128 (*)    GFR calc non Af Amer 77 (*)    GFR calc Af Amer 89 (*)    All other components within normal limits  VALPROIC ACID LEVEL - Abnormal; Notable for the following:    Valproic Acid Lvl 21.8 (*)    All other components within normal limits  CBC WITH DIFFERENTIAL  Randolm Idol, ED    Imaging Review Dg Chest 2 View  01/03/2014   CLINICAL DATA:  Left-sided weakness. Shortness of breath. Chest pain.  EXAM: CHEST  2 VIEW  COMPARISON:  05/11/2013  FINDINGS: Segmental elevation of the right hemidiaphragm, chronic. Mild hyperinflation. Normal heart size and pulmonary vascularity. No focal airspace  disease in the lungs. No blunting of costophrenic angles. No pneumothorax. Calcified and tortuous aorta. Degenerative changes in the spine.  IMPRESSION: No active cardiopulmonary disease.   Electronically Signed   By: Lucienne Capers M.D.   On: 01/03/2014 21:49   Ct Head Wo Contrast  01/03/2014   CLINICAL DATA:  Right arm and hand weakness with right-sided neck pain and right-sided headache for 3 weeks. Pain worsening today.  EXAM: CT HEAD WITHOUT CONTRAST  CT CERVICAL SPINE WITHOUT CONTRAST  TECHNIQUE: Multidetector CT imaging of the head and cervical spine was performed following the standard protocol without intravenous contrast. Multiplanar CT image reconstructions of the cervical spine were also generated.  COMPARISON:  CT head 11/08/2012. CT head and cervical spine 10/16/2012.  FINDINGS: CT HEAD FINDINGS  Diffuse cerebral atrophy. No ventricular dilatation. Low-attenuation changes throughout the deep white matter consistent with small vessel ischemia. No change in pattern since previous study. No mass effect or midline shift. No abnormal extra-axial fluid collections. Gray-white matter junctions are distinct. Basal cisterns are not effaced. No evidence of acute intracranial hemorrhage. No depressed skull fractures. Mucosal thickening in the paranasal sinuses. Opacification of some of the ethmoid air cells. Mastoid air cells are not opacified. Vascular calcifications.  CT CERVICAL SPINE FINDINGS  Normal alignment of the cervical spine and facet joints. Degenerative changes in the cervical spine with narrowed interspace and hypertrophic changes mostly at C5-6 level. Intervertebral disc space heights are otherwise normal. No prevertebral soft tissue swelling. No vertebral compression fractures. Normal alignment of the facet joints. Old fracture of the spinous process of T1 is unchanged since prior study. C1-2 articulation appears intact. No focal bone lesion or bone destruction. Vascular calcifications in  the cervical carotid arteries. Soft tissues are otherwise unremarkable. Suggestion of infiltration or atelectasis in the right lung apex. Emphysematous changes in the lung apices.  IMPRESSION: No acute intracranial abnormalities. Chronic atrophy and small vessel ischemic changes.  Normal alignment of the cervical spine. Degenerative changes. Old fracture of the T1 spinous process. No acute displaced fractures appreciated.   Electronically  Signed   By: Lucienne Capers M.D.   On: 01/03/2014 22:41   Ct Cervical Spine Wo Contrast  01/03/2014   CLINICAL DATA:  Right arm and hand weakness with right-sided neck pain and right-sided headache for 3 weeks. Pain worsening today.  EXAM: CT HEAD WITHOUT CONTRAST  CT CERVICAL SPINE WITHOUT CONTRAST  TECHNIQUE: Multidetector CT imaging of the head and cervical spine was performed following the standard protocol without intravenous contrast. Multiplanar CT image reconstructions of the cervical spine were also generated.  COMPARISON:  CT head 11/08/2012. CT head and cervical spine 10/16/2012.  FINDINGS: CT HEAD FINDINGS  Diffuse cerebral atrophy. No ventricular dilatation. Low-attenuation changes throughout the deep white matter consistent with small vessel ischemia. No change in pattern since previous study. No mass effect or midline shift. No abnormal extra-axial fluid collections. Gray-white matter junctions are distinct. Basal cisterns are not effaced. No evidence of acute intracranial hemorrhage. No depressed skull fractures. Mucosal thickening in the paranasal sinuses. Opacification of some of the ethmoid air cells. Mastoid air cells are not opacified. Vascular calcifications.  CT CERVICAL SPINE FINDINGS  Normal alignment of the cervical spine and facet joints. Degenerative changes in the cervical spine with narrowed interspace and hypertrophic changes mostly at C5-6 level. Intervertebral disc space heights are otherwise normal. No prevertebral soft tissue swelling. No  vertebral compression fractures. Normal alignment of the facet joints. Old fracture of the spinous process of T1 is unchanged since prior study. C1-2 articulation appears intact. No focal bone lesion or bone destruction. Vascular calcifications in the cervical carotid arteries. Soft tissues are otherwise unremarkable. Suggestion of infiltration or atelectasis in the right lung apex. Emphysematous changes in the lung apices.  IMPRESSION: No acute intracranial abnormalities. Chronic atrophy and small vessel ischemic changes.  Normal alignment of the cervical spine. Degenerative changes. Old fracture of the T1 spinous process. No acute displaced fractures appreciated.   Electronically Signed   By: Lucienne Capers M.D.   On: 01/03/2014 22:41     EKG Interpretation   Date/Time:  Thursday January 03 2014 20:46:15 EST Ventricular Rate:  64 PR Interval:  189 QRS Duration: 89 QT Interval:  446 QTC Calculation: 460 R Axis:   13 Text Interpretation:  Sinus rhythm No significant change since last  tracing Confirmed by YAO  MD, DAVID ([id number]) on 01/03/2014 9:15:45 PM      MDM   Final diagnoses:  Chest pain  Weakness   Patrick Holt is a 63 y.o. male here with R sided weakness, neck pain. CT head showed chronic changes. I called Dr. Corinna Lines, who recommend inpatient admission for stroke workup. Family practice will admit. Labs at baseline.     Wandra Arthurs, MD 01/03/14 [phone]

## 2014-01-03 NOTE — ED Notes (Signed)
Patient returned from CT

## 2014-01-03 NOTE — ED Notes (Signed)
Per EMS: pt coming from home with right side weakness for three weeks. Pt also reports right side neck pain and chest pain upon arrival to ED. Pt A&Ox4, respirations equal and unlabored, skin warm and dry

## 2014-01-04 ENCOUNTER — Telehealth: Payer: Self-pay | Admitting: Internal Medicine

## 2014-01-04 ENCOUNTER — Inpatient Hospital Stay (HOSPITAL_COMMUNITY): Payer: Medicare HMO

## 2014-01-04 ENCOUNTER — Encounter (HOSPITAL_COMMUNITY): Payer: Self-pay

## 2014-01-04 DIAGNOSIS — I369 Nonrheumatic tricuspid valve disorder, unspecified: Secondary | ICD-10-CM

## 2014-01-04 DIAGNOSIS — M6289 Other specified disorders of muscle: Secondary | ICD-10-CM

## 2014-01-04 DIAGNOSIS — G8191 Hemiplegia, unspecified affecting right dominant side: Secondary | ICD-10-CM | POA: Diagnosis present

## 2014-01-04 DIAGNOSIS — R531 Weakness: Secondary | ICD-10-CM | POA: Diagnosis present

## 2014-01-04 DIAGNOSIS — R208 Other disturbances of skin sensation: Secondary | ICD-10-CM

## 2014-01-04 DIAGNOSIS — G40309 Generalized idiopathic epilepsy and epileptic syndromes, not intractable, without status epilepticus: Secondary | ICD-10-CM | POA: Diagnosis present

## 2014-01-04 DIAGNOSIS — E785 Hyperlipidemia, unspecified: Secondary | ICD-10-CM

## 2014-01-04 DIAGNOSIS — E119 Type 2 diabetes mellitus without complications: Secondary | ICD-10-CM | POA: Insufficient documentation

## 2014-01-04 DIAGNOSIS — I10 Essential (primary) hypertension: Secondary | ICD-10-CM

## 2014-01-04 DIAGNOSIS — M5412 Radiculopathy, cervical region: Secondary | ICD-10-CM | POA: Insufficient documentation

## 2014-01-04 DIAGNOSIS — Z6841 Body Mass Index (BMI) 40.0 and over, adult: Secondary | ICD-10-CM | POA: Diagnosis not present

## 2014-01-04 DIAGNOSIS — Z6835 Body mass index (BMI) 35.0-35.9, adult: Secondary | ICD-10-CM | POA: Diagnosis not present

## 2014-01-04 DIAGNOSIS — Z87442 Personal history of urinary calculi: Secondary | ICD-10-CM | POA: Diagnosis not present

## 2014-01-04 DIAGNOSIS — I252 Old myocardial infarction: Secondary | ICD-10-CM | POA: Diagnosis not present

## 2014-01-04 DIAGNOSIS — I481 Persistent atrial fibrillation: Secondary | ICD-10-CM | POA: Diagnosis present

## 2014-01-04 DIAGNOSIS — H5441 Blindness, right eye, normal vision left eye: Secondary | ICD-10-CM | POA: Diagnosis present

## 2014-01-04 DIAGNOSIS — F419 Anxiety disorder, unspecified: Secondary | ICD-10-CM | POA: Diagnosis present

## 2014-01-04 DIAGNOSIS — I4891 Unspecified atrial fibrillation: Secondary | ICD-10-CM | POA: Insufficient documentation

## 2014-01-04 DIAGNOSIS — M6281 Muscle weakness (generalized): Secondary | ICD-10-CM

## 2014-01-04 DIAGNOSIS — R350 Frequency of micturition: Secondary | ICD-10-CM | POA: Diagnosis present

## 2014-01-04 DIAGNOSIS — F17210 Nicotine dependence, cigarettes, uncomplicated: Secondary | ICD-10-CM | POA: Diagnosis present

## 2014-01-04 DIAGNOSIS — Z7982 Long term (current) use of aspirin: Secondary | ICD-10-CM | POA: Diagnosis not present

## 2014-01-04 DIAGNOSIS — I480 Paroxysmal atrial fibrillation: Secondary | ICD-10-CM | POA: Diagnosis present

## 2014-01-04 DIAGNOSIS — K219 Gastro-esophageal reflux disease without esophagitis: Secondary | ICD-10-CM | POA: Diagnosis present

## 2014-01-04 DIAGNOSIS — M797 Fibromyalgia: Secondary | ICD-10-CM | POA: Diagnosis present

## 2014-01-04 DIAGNOSIS — F319 Bipolar disorder, unspecified: Secondary | ICD-10-CM | POA: Diagnosis present

## 2014-01-04 DIAGNOSIS — Z9849 Cataract extraction status, unspecified eye: Secondary | ICD-10-CM | POA: Diagnosis not present

## 2014-01-04 DIAGNOSIS — R200 Anesthesia of skin: Secondary | ICD-10-CM | POA: Insufficient documentation

## 2014-01-04 DIAGNOSIS — E669 Obesity, unspecified: Secondary | ICD-10-CM | POA: Diagnosis present

## 2014-01-04 DIAGNOSIS — I5020 Unspecified systolic (congestive) heart failure: Secondary | ICD-10-CM | POA: Diagnosis present

## 2014-01-04 DIAGNOSIS — F1120 Opioid dependence, uncomplicated: Secondary | ICD-10-CM | POA: Diagnosis present

## 2014-01-04 DIAGNOSIS — F449 Dissociative and conversion disorder, unspecified: Secondary | ICD-10-CM | POA: Diagnosis present

## 2014-01-04 DIAGNOSIS — J449 Chronic obstructive pulmonary disease, unspecified: Secondary | ICD-10-CM | POA: Diagnosis present

## 2014-01-04 DIAGNOSIS — I2510 Atherosclerotic heart disease of native coronary artery without angina pectoris: Secondary | ICD-10-CM | POA: Diagnosis present

## 2014-01-04 DIAGNOSIS — F42 Obsessive-compulsive disorder: Secondary | ICD-10-CM | POA: Diagnosis present

## 2014-01-04 DIAGNOSIS — G629 Polyneuropathy, unspecified: Secondary | ICD-10-CM | POA: Diagnosis present

## 2014-01-04 DIAGNOSIS — M069 Rheumatoid arthritis, unspecified: Secondary | ICD-10-CM | POA: Diagnosis present

## 2014-01-04 DIAGNOSIS — I7300 Raynaud's syndrome without gangrene: Secondary | ICD-10-CM | POA: Diagnosis present

## 2014-01-04 DIAGNOSIS — C8190 Hodgkin lymphoma, unspecified, unspecified site: Secondary | ICD-10-CM | POA: Diagnosis present

## 2014-01-04 DIAGNOSIS — Z8673 Personal history of transient ischemic attack (TIA), and cerebral infarction without residual deficits: Secondary | ICD-10-CM | POA: Diagnosis not present

## 2014-01-04 DIAGNOSIS — Z86010 Personal history of colonic polyps: Secondary | ICD-10-CM | POA: Diagnosis not present

## 2014-01-04 DIAGNOSIS — D869 Sarcoidosis, unspecified: Secondary | ICD-10-CM | POA: Diagnosis present

## 2014-01-04 LAB — LIPID PANEL
CHOL/HDL RATIO: 2.8 ratio
Cholesterol: 125 mg/dL (ref 0–200)
HDL: 45 mg/dL (ref >39)
LDL CALC: 57 mg/dL (ref 0–99)
Triglycerides: 116 mg/dL (ref <150)
VLDL: 23 mg/dL (ref 0–40)

## 2014-01-04 LAB — HEMOGLOBIN A1C
Hgb A1c MFr Bld: 5.8 % — ABNORMAL HIGH (ref <5.7)
Mean Plasma Glucose: 120 mg/dL — ABNORMAL HIGH (ref <117)

## 2014-01-04 LAB — TROPONIN I
Troponin I: <0.3 ng/mL (ref <0.30)
Troponin I: <0.3 ng/mL (ref <0.30)

## 2014-01-04 LAB — TSH: TSH: 3.37 u[IU]/mL (ref 0.350–4.500)

## 2014-01-04 MED ORDER — FOLIC ACID 1 MG PO TABS
1.0000 mg | ORAL_TABLET | Freq: Every day | ORAL | Status: DC
  Administered 2014-01-04 – 2014-01-06 (×3): 1 mg via ORAL
  Filled 2014-01-04 (×3): qty 1

## 2014-01-04 MED ORDER — THIAMINE HCL 100 MG/ML IJ SOLN
100.0000 mg | Freq: Every day | INTRAMUSCULAR | Status: DC
  Filled 2014-01-04: qty 2

## 2014-01-04 MED ORDER — ASPIRIN 325 MG PO TABS
325.0000 mg | ORAL_TABLET | Freq: Every day | ORAL | Status: DC
  Administered 2014-01-04 – 2014-01-06 (×3): 325 mg via ORAL
  Filled 2014-01-04 (×3): qty 1

## 2014-01-04 MED ORDER — HYDROCODONE-ACETAMINOPHEN 5-325 MG PO TABS
1.0000 | ORAL_TABLET | Freq: Four times a day (QID) | ORAL | Status: DC | PRN
  Administered 2014-01-04: 2 via ORAL
  Filled 2014-01-04: qty 2

## 2014-01-04 MED ORDER — VITAMIN B-1 100 MG PO TABS
100.0000 mg | ORAL_TABLET | Freq: Every day | ORAL | Status: DC
  Administered 2014-01-04 – 2014-01-05 (×2): 100 mg via ORAL
  Filled 2014-01-04 (×6): qty 1

## 2014-01-04 MED ORDER — LORAZEPAM 1 MG PO TABS: 1.0000 mg | ORAL_TABLET | Freq: Four times a day (QID) | ORAL | Status: DC | PRN

## 2014-01-04 MED ORDER — MORPHINE SULFATE 15 MG PO TABS
7.5000 mg | ORAL_TABLET | ORAL | Status: DC | PRN
  Administered 2014-01-04 – 2014-01-06 (×4): 7.5 mg via ORAL
  Filled 2014-01-04 (×4): qty 1

## 2014-01-04 MED ORDER — ALPRAZOLAM 0.5 MG PO TABS
1.0000 mg | ORAL_TABLET | Freq: Every day | ORAL | Status: DC
  Administered 2014-01-04 – 2014-01-06 (×3): 1 mg via ORAL
  Filled 2014-01-04 (×3): qty 2

## 2014-01-04 MED ORDER — AMIODARONE HCL 200 MG PO TABS
200.0000 mg | ORAL_TABLET | Freq: Every day | ORAL | Status: DC
  Administered 2014-01-04 – 2014-01-06 (×3): 200 mg via ORAL
  Filled 2014-01-04 (×3): qty 1

## 2014-01-04 MED ORDER — ADULT MULTIVITAMIN W/MINERALS CH: 1.0000 | ORAL_TABLET | Freq: Every day | ORAL | Status: DC

## 2014-01-04 MED ORDER — PANTOPRAZOLE SODIUM 40 MG PO TBEC
80.0000 mg | DELAYED_RELEASE_TABLET | Freq: Every day | ORAL | Status: DC
  Administered 2014-01-04 – 2014-01-06 (×3): 80 mg via ORAL
  Filled 2014-01-04 (×3): qty 2

## 2014-01-04 MED ORDER — DIPHENHYDRAMINE HCL 50 MG/ML IJ SOLN
25.0000 mg | Freq: Once | INTRAMUSCULAR | Status: AC
  Administered 2014-01-04: 25 mg via INTRAVENOUS
  Filled 2014-01-04: qty 1

## 2014-01-04 MED ORDER — LORAZEPAM 2 MG/ML IJ SOLN: 1.0000 mg | Freq: Four times a day (QID) | INTRAMUSCULAR | Status: DC | PRN

## 2014-01-04 MED ORDER — ROPINIROLE HCL 1 MG PO TABS
2.0000 mg | ORAL_TABLET | Freq: Three times a day (TID) | ORAL | Status: DC
  Administered 2014-01-04 – 2014-01-06 (×7): 2 mg via ORAL
  Filled 2014-01-04 (×7): qty 2

## 2014-01-04 MED ORDER — ASPIRIN 81 MG PO CHEW: 81.0000 mg | CHEWABLE_TABLET | Freq: Every day | ORAL | Status: DC

## 2014-01-04 MED ORDER — ADULT MULTIVITAMIN W/MINERALS CH
1.0000 | ORAL_TABLET | Freq: Every day | ORAL | Status: DC
  Administered 2014-01-04 – 2014-01-06 (×3): 1 via ORAL
  Filled 2014-01-04 (×3): qty 1

## 2014-01-04 MED ORDER — ACETAMINOPHEN 650 MG RE SUPP: 650.0000 mg | RECTAL | Status: DC | PRN

## 2014-01-04 MED ORDER — ATORVASTATIN CALCIUM 40 MG PO TABS
40.0000 mg | ORAL_TABLET | Freq: Every day | ORAL | Status: DC
  Administered 2014-01-04 – 2014-01-06 (×3): 40 mg via ORAL
  Filled 2014-01-04 (×3): qty 1

## 2014-01-04 MED ORDER — DIVALPROEX SODIUM ER 500 MG PO TB24
500.0000 mg | ORAL_TABLET | Freq: Every day | ORAL | Status: DC
  Administered 2014-01-04 – 2014-01-06 (×3): 500 mg via ORAL
  Filled 2014-01-04 (×3): qty 1

## 2014-01-04 MED ORDER — OXYBUTYNIN CHLORIDE 5 MG PO TABS
5.0000 mg | ORAL_TABLET | Freq: Three times a day (TID) | ORAL | Status: DC
  Administered 2014-01-04 – 2014-01-06 (×7): 5 mg via ORAL
  Filled 2014-01-04 (×7): qty 1

## 2014-01-04 MED ORDER — LEVETIRACETAM 500 MG PO TABS
500.0000 mg | ORAL_TABLET | Freq: Two times a day (BID) | ORAL | Status: DC
  Administered 2014-01-04 – 2014-01-06 (×6): 500 mg via ORAL
  Filled 2014-01-04 (×5): qty 1
  Filled 2014-01-04: qty 2

## 2014-01-04 MED ORDER — ACETAMINOPHEN 325 MG PO TABS: 650.0000 mg | ORAL_TABLET | ORAL | Status: DC | PRN

## 2014-01-04 MED ORDER — SODIUM CHLORIDE 0.9 % IV SOLN
INTRAVENOUS | Status: DC
  Administered 2014-01-04 – 2014-01-05 (×2): via INTRAVENOUS

## 2014-01-04 MED ORDER — STROKE: EARLY STAGES OF RECOVERY BOOK
Freq: Once | Status: DC
  Filled 2014-01-04: qty 1

## 2014-01-04 NOTE — Progress Notes (Signed)
Family Medicine Teaching Service Daily Progress Note Intern Pager: [phone]  Patient name: Patrick Holt Medical record number: [id number] Date of birth: [birth year] Age: 63 y.o. Gender: male  Primary Care Provider: Howard Pouch, DO Consultants: Neurology Code Status: Full  Pt Overview and Major Events to Date:  11/19 Admitted for stroke work up given right sided weakness  Assessment and Plan: Patrick Holt is a 63 y.o. male presenting with right sided weakness x 3 weeks with new onset left sided numbness. PMH is significant for COPD, CAD, afib, HTN, HLD, seizure disorder (possibly in the setting of Xanax withdraw), dermatomyositis, RA, subdural or subarachnoid hemorrhages noted on MRI in 2013.   #Right sided weakness: Differential includes ischemic stroke, spinal pathology, and radiculopathy. CT of head without any acute changes and cervical spine CT unremarkable. Patient does have a h/o afib and but is not on anticoagulation 2/2 prior intracerebral hemorrhages. Other risk factors include HTN and HLD.  - MRI: No acute abnormality - carotid dopplers without significant stenosis - f/u echo - Consider obtaining c-spine MR - TSH wnl, lipid panel at goal, A1C pending - continue ASA 81mg   - neuro checks q2hrs - neurology following, appreciate recs - consult PT/OT/Speech  - NPO until he passes bedside swallow evaluation  #Chest pain: Intermittent chest pain. Limited history secondary to patient's perseverative and tangential speech. Known hx of CAD with prior MI's.  - Troponin negative x2  #Atrial fibrillation, CAD: Nuclear stress test in 02/2013 showed possible old infarct or diaphragmatic attenuation with an otherwise normal perfusion. CHADVASC score is 3 (HTN, DM, h/o MI) which is high risk-3.2%- (even if you consider his DM resolved as he's been controlled so well without medications, his risk is still elevated at 2.2%).The patient is not on anticoagulation due to bleed  history.  - in NSR in the ED - Continue home ASA  - continue amiodarone - monitor on telemetry  #COPD: Patient endorses occasional productive cough, however appears to be at his baseline. CXR with no acute changes. Patient will not stop smoking as doctors at [facility] told him to start smoking so that his "lungs would be covered with tar and he'd be protected against viruses and bacteria."  - consider restart of long-acting inhaler (pt reportedly not taking combivent)  #Neuro/Psych: Patient well maintained on Depakote, Keppra and Xanax. Has had seizures in the past but it was in the setting of Xanax withdraw. Valproic acid level decreased on presentation. - continue home Depakote, Keppra, and Xanax - continue Requip   #HTN: Patient not on an antihypertensive currently with stable BPs. Was prescribed lisinopril in the past but discontinued it due to diarrhea.  - continue to monitor  #HLD: - Continue home Lipitor  - lipid panel at goal  #Diabetes mellitus: currently well controlled without medication (was previously on glipizide). Last A1c was 5.6 - f/u A1c  #Substance abuse: Patient smokes ~1/2ppd. Additionally, per the EMR he has a h/o alcohol abuse. Patient states he does not drink at all any more. -Patient allergic to nicotine patch  -place on CIWA protocol  #Urinary frequency: - Continue oxybutynin  - f/u UA  FEN/GI: NPO until bedside swallow eval, Protonix, NS @100cc /hr Prophylaxis: SCDs given h/o bleed  Disposition: Admitted to telemetry pending work up as above  Subjective:  NAEON. Some neck pain this morning. No chest pain or shortness of breath. No fevers or chills.  Objective: Temp:  [98.3 F (36.8 C)-99.1 F (37.3 C)] 98.8 F (37.1 C) (11/20  0600) Pulse Rate:  [49-69] 58 (11/20 1000) Resp:  [10-27] 20 (11/20 0600) BP: (115-178)/(62-102) 161/72 mmHg (11/20 1000) SpO2:  [92 %-96 %] 95 % (11/20 0600) Weight:  [216 lb (97.977 kg)-262 lb 4.8 oz (118.978  kg)] 262 lb 4.8 oz (118.978 kg) (11/20 0201) Physical Exam: General: 63 y/o male lying in bed in NAD HEENT: Atraumatic. L pupil round and reactive to light. R pupil not reactive to light (chronic). EOMI without nystagmus. Dry MM. Very poor dentition with only a few small broken teeth on the bottom.  Cardiovascular: Bradycardic. Regular rhythm. No murmurs appreciated.   No carotid bruits noted Respiratory: NWOB. Mild wheezing noted bilaterally. No crackles. Abdomen: +BS, S, NTND.  Extremities: Trace pitting edema b/l.  Skin: No rashes noted Neuro: A&Ox4. Speech clear and coherent. Smile symmetric. Face symmetric. Tongue midline. Sensation decreased to light touch over the right side of the face. 4-/5 dorsiflexion, plantar flexion, and hip flexion on the right. 4+/5 dorsiflexion, plantar flexion, and hip flexion on the left. Grip, shoulder shrug, and elbow extension 4-/5 on R, 4+/5 on L. EOMI.  Laboratory:  Recent Labs Lab 01/03/14 2121  WBC 6.9  HGB 14.8  HCT 44.9  PLT 262    Recent Labs Lab 01/03/14 2121  NA 143  K 4.1  CL 106  CO2 23  BUN 13  CREATININE 1.01  CALCIUM 9.0  PROT 7.0  BILITOT 0.3  ALKPHOS 81  ALT 19  AST 25  GLUCOSE 128*    Troponin neg TSH 3.370  Imaging/Diagnostic Tests: Dg Chest 2 View 01/03/2014   IMPRESSION: No active cardiopulmonary disease.     Ct Head Wo Contrast 01/03/2014   IMPRESSION: No acute intracranial abnormalities. Chronic atrophy and small vessel ischemic changes.  Normal alignment of the cervical spine. Degenerative changes. Old fracture of the T1 spinous process. No acute displaced fractures appreciated.     Ct Cervical Spine Wo Contrast 01/03/2014   IMPRESSION: No acute intracranial abnormalities. Chronic atrophy and small vessel ischemic changes.  Normal alignment of the cervical spine. Degenerative changes. Old fracture of the T1 spinous process. No acute displaced fractures appreciated.     Mr Brain Wo Contrast 01/04/2014     IMPRESSION: 1. No acute intracranial abnormality. 2. Moderate chronic small vessel ischemic disease and cerebral atrophy.    Patrick Chyle, MD 01/04/2014, 11:56 AM PGY-1, [place] Intern pager: [phone], text pages welcome

## 2014-01-04 NOTE — Progress Notes (Signed)
STROKE TEAM PROGRESS NOTE   HISTORY Patrick Holt is an 63 y.o. male presenting with 3 weeks of right sided weakness, pain and now left sided sensory loss. He states that 3 weeks ago he noted numbness in his right arm along with difficulty closing his R hand. This then progressed to pain radiating up his R arm, into his neck, and into his temple. He feels that his RUE has gotten progressively weaker over the past few weeks. Also notes some mild weakness in RLE.  Tonight, the pain in the right neck got really bad. He also noted that his left arm has now become numb and he's had difficulty closing his left hand. 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   Has history significant for CAD, A fib, HTN, HLD, seizure disorder, subdural or subarachnoid on MRI in 2013. He is not on anticoagulation due to a bleeding history.   Patient was not administered TPA secondary to late presentation and previous intracranial hemorrhage.  SUBJECTIVE (INTERVAL HISTORY) No family members present. The patient coughs frequently while trying to eat and speak at the same time. He was cautioned to do one or the other. He reports that he is still having significant neck pain. He reports decreased sensation of the right upper extremity with right hand weakness as well as LUE numbness. He has a frequent shooting pain down his left leg. He has a previous history of back problems. He also has weakness and numbness of the right lower extremity. Dr. Erlinda Hong recommended an MRI of the cervical spine as well as an outpatient EMG and nerve conduction study. Dr. Erlinda Hong discussed these recommendations with the [facility]MT[facility] attending Dr Gwendlyn Deutscher.   OBJECTIVE Temp:  [98.3 F (36.8 C)-99.1 F (37.3 C)] 98.8 F (37.1 C) (11/20 0600) Pulse Rate:  [49-69] 53 (11/20 0600) Cardiac Rhythm:  [-] Sinus bradycardia (11/20 0208) Resp:  [10-27] 20  (11/20 0600) BP: (115-178)/(62-102) 148/89 mmHg (11/20 0600) SpO2:  [92 %-96 %] 95 % (11/20 0600) Weight:  [216 lb (97.977 kg)-262 lb 4.8 oz (118.978 kg)] 262 lb 4.8 oz (118.978 kg) (11/20 0201)  No results for input(s): GLUCAP in the last 168 hours.  Recent Labs Lab 01/03/14 2121  NA 143  K 4.1  CL 106  CO2 23  GLUCOSE 128*  BUN 13  CREATININE 1.01  CALCIUM 9.0    Recent Labs Lab 01/03/14 2121  AST 25  ALT 19  ALKPHOS 81  BILITOT 0.3  PROT 7.0  ALBUMIN 3.5    Recent Labs Lab 01/03/14 2121  WBC 6.9  NEUTROABS 3.7  HGB 14.8  HCT 44.9  MCV 92.2  PLT 262    Recent Labs Lab 01/04/14 0244  TROPONINI <0.30   No results for input(s): LABPROT, INR in the last 72 hours. No results for input(s): COLORURINE, LABSPEC, [place], GLUCOSEU, HGBUR, BILIRUBINUR, KETONESUR, PROTEINUR, UROBILINOGEN, NITRITE, LEUKOCYTESUR in the last 72 hours.  Invalid input(s): APPERANCEUR     Component Value Date/Time   CHOL 125 01/04/2014 0244   TRIG 116 01/04/2014 0244   HDL 45 01/04/2014 0244   CHOLHDL 2.8 01/04/2014 0244   VLDL 23 01/04/2014 0244   LDLCALC 57 01/04/2014 0244   Lab Results  Component Value Date   HGBA1C 5.6 10/12/2013      Component Value Date/Time   LABOPIA NONE DETECTED 11/09/2012 0010   COCAINSCRNUR NONE DETECTED  11/09/2012 0010   LABBENZ NONE DETECTED 11/09/2012 0010   AMPHETMU NONE DETECTED 11/09/2012 0010   THCU NONE DETECTED 11/09/2012 0010   LABBARB NONE DETECTED 11/09/2012 0010    No results for input(s): ETH in the last 168 hours.   I have personally reviewed the radiological images below and agree with the radiology interpretations.  Carotid Dopplers - Bilateral: 1-39% ICA stenosis. Vertebral artery flow is antegrade.   2-D echocardiogram - Left ventricle: The cavity size was normal. There was moderate concentric hypertrophy. Systolic function was mildly reduced. The estimated ejection fraction was in the range of 45% to 50%.  There is akinesis of the basal-midinferior myocardium. Doppler parameters are consistent with abnormal left ventricular relaxation (grade 1 diastolic dysfunction). - Mitral valve: Calcified annulus. - Left atrium: The atrium was moderately dilated. - Right ventricle: The cavity size was mildly dilated. Wall thickness was normal. - Right atrium: The atrium was mildly dilated. - Pulmonary arteries: Systolic pressure was moderately increased. PA peak pressure: 60 mm Hg (S). Impressions: - When compared to prior, EF is reduced and inferior wall motion abnormality appears new.  Dg Chest 2 View 01/03/2014    No active cardiopulmonary disease.   Ct Head Wo Contrast 01/03/2014    No acute intracranial abnormalities. Chronic atrophy and small vessel ischemic changes.    Ct Cervical Spine Wo Contrast 01/03/2014    Normal alignment of the cervical spine. Degenerative changes. Old fracture of the T1 spinous process. No acute displaced fractures appreciated.     MRI 01/04/2014 1. No acute intracranial abnormality. 2. Moderate chronic small vessel ischemic disease and cerebral atrophy.   PHYSICAL EXAM  Temp:  [98.3 F (36.8 C)-99.1 F (37.3 C)] 98.8 F (37.1 C) (11/20 1410) Pulse Rate:  [49-69] 68 (11/20 1410) Resp:  [10-27] 20 (11/20 1410) BP: (115-178)/(62-102) 147/88 mmHg (11/20 1410) SpO2:  [92 %-96 %] 95 % (11/20 1410) Weight:  [216 lb (97.977 kg)-262 lb 4.8 oz (118.978 kg)] 262 lb 4.8 oz (118.978 kg) (11/20 0201)  General - Well nourished, well developed, in no apparent distress.  Ophthalmologic - not able to see through.  Cardiovascular - Regular rate and rhythm with no murmur.  Neck - tenderness on palpation of cervical vertebrae, R>L.  Mental Status -  Level of arousal and orientation to time, place, and person were intact. Language including expression, naming, repetition, comprehension was assessed and found intact.  Cranial Nerves II - XII - II -  Visual field intact OU. III, IV, VI - Extraocular movements intact. V - on light touch right face decreased sensation about 80% of left. VII - Facial movement intact bilaterally. VIII - Hearing & vestibular intact bilaterally. X - Palate elevates symmetrically. XI - Chin turning & shoulder shrug intact bilaterally. XII - Tongue protrusion intact.  Motor Strength - The patient's strength was RUE 3+/5 proximal but limited significantly due to pain, distally 3/5 and pronator drift was present on the right.  LUE 5/5. RLE 4/5 and LLE 5/5. Bulk was normal and fasciculations were absent.  Motor Tone - Muscle tone was assessed at the neck and appendages and was normal.  Reflexes - The patient's reflexes were 1+ in all extremities and he had no pathological reflexes.  Sensory - Light touch, temperature/pinprick and were normal decreased on the RUE about 20% of LUE, RLE about 50% of the LLE, however, the sensation are patchy and subjective.    Coordination - The patient had normal movements in the hands with no ataxia  or dysmetria.  Tremor was absent.  Gait and Station - not tested.   ASSESSMENT/PLAN Patrick Holt is a 63 y.o. male with history of atrial fibrillation not anticoagulated, coronary artery disease, hypertension, hyperlipidemia, seizure disorder, and subdural or subarachnoid hemorrhage in 2013 presenting with right arm weakness, pain, and numbness for 3 weeks and then progressed to left arm numbness and shooting pain on left leg. He did not receive IV t-PA due to late presentation and previous intracranial hemorrhage. MRI brain negative for infarct, and there is functional component on his exam. Presentation concerning for cervical spine pathology, likely cervical radiculopathy but need C-spine MRI to rule out myelopathy. If negative, he needs PT/OT and outpt EMG/NCS.   He has very small bifrontal SDH in 01/2012 admission after a grand mal seizure with fall. CT head negative and  repeat MRI showed small bifrontal SDH. I still think it was trauma related. He has afib with intermittent RVR and his CHADS-VASc = 3 and HAS-BLED score = 2 (see below), I would recommend anticoagulation with eliquis which seems to have least bleeding risk among NOACs. However, I would encourage primary team to discuss with his cardiology to see if we can be in agreement.  Bilateral arm paresthesia - considering bilateral cervical radiculopathy   Resultant - bilateral sensory deficits, right hemiparesis  MRI - No acute intracranial abnormality.  MRA - Not ordered  Carotid Doppler - Preliminary findings: Bilateral: 1-39% ICA stenosis. Vertebral artery flow is antegrade.   2D Echo ejection fraction was in the range of 45% to 50%. Thereis akinesis of the basal-midinferior myocardium.  LDL 57, on the goal < 70  HgbA1c pending  SCDs for DVT prophylaxis  Diet heart healthy/carb modified with thin liquids  aspirin 81 mg orally every day prior to admission, now on aspirin 325 mg orally every day  Ongoing aggressive risk factor management  Therapy recommendations:  Pending  Disposition:  Pending  Recommend MRI of the cervical spine  Recommend outpatient EMG and nerve conduction study if negative for myelopathy.  Afib not on anticoagulation - hx of persistent afib and intermittent afib with RVR - not on anticoagulation due to SDH in 2013  - however, 01/2012 he had very small bilateral frontal SDH in the setting of seizure followed by fall, likely traumatic. And also CT negative, SDH was picked up by MRI.  - calculated CHADS-VASc = 3 and HAS-BLED = 2, stroke risk overweighs bleeding risk. (see below) - recommend anticoagulation with eliquis which may be the least risk of bleeding among NOACs - please discuss with his cardiologist to see if we can be in agreement.   CHADS-VASc score         Condition Points   C CHF (or Left ventricular systolic dysfunction) 0/1   H HTN: BP  consistently above 140/90 mmHg (or treated HTN on meds)  1/1   A2 Age ?57 years 0/2   D DM 0/1   S2 Prior Stroke or TIA or thromboembolism 1/2   V Vascular disease (e.g. PAD, MI, aortic plaque) 1/1   A Age 94-74 years 0/1   [place] male sex  0/1           Annual Stroke Risk CHA2DS2-VASc Score Stroke Risk % 95% CI  0 0  1 1.3  2 2.2  3 3.2  4 4.0  5 6.7  6 9.8  7 9.6  8 12.5  9 15.2      HAS-BLED score   Condition  Points   H HTN: uncontrolled, >292 mmHg systolic 0/1   A2 Abnormal renal function: Dialysis, transplant, Cr >2.6 mg/dL or >200 mol/L Abnormal liver function: Cirrhosis or Bilirubin >2x Normal or AST/ALT/AP >3x Normal 0/1 0/1   S Prior history of stroke  1/1   B Bleeding: prior major bleeding or predisposition to bleeding 0/1  L Labile INR: unstable / high INRs, time in therapeutic range < 60% 0/1  E Elderly: age > 65 years Medication Usage Predisposing to Bleeding: (Antiplatelet agents, NSAIDs) 0/1 1/1  D  Prior alcohol or drug usage history 0/1    HAS-BLED score n Bleeds, n Bleeds/100 patients  0 798 9 1.13  1 1286 13 1.02  2 744 14 1.88  3 187 7 3.74  4 46 4 8.70  5 8 1  12.50   Seizure  - hx of seizure - last seizure in 2013 - on keppra and depakote - seizure precautions.  Hypertension  Home meds: No antihypertensive medications prior to admission.  Occasional mildly elevated blood pressures but otherwise stable.  Hyperlipidemia  Home meds:  Lipitor - resumed in hospital  LDL 57, goal < 70  Continue statin at discharge  Other Stroke Risk Factors  Advanced age  Cigarette smoker, advised to stop smoking  Obesity, Body mass index is 43.65 kg/(m^2).   Family hx stroke (mother)  Coronary artery disease  Other Active Problems  History of diabetes mellitus   Other Pertinent History  Dermatomyositis history  Fibromyalgia   Hospital day # 1  Mikey Bussing PA-C [facility] Pager [phone] 01/04/2014, 8:59  AM  I, the attending vascular neurologist, have personally obtained a history, examined the patient, evaluated laboratory data, individually viewed imaging studies. Together with the NP/PA, we formulated the assessment and plan of care which reflects our mutual decision.  I have made any additions or clarifications directly to the above note and agree with the findings and plan as currently documented.   Rosalin Hawking, MD PhD Stroke Neurology 01/04/2014 5:33 PM    To contact Stroke Continuity provider, please refer to [email or web address] After hours, contact General Neurology

## 2014-01-04 NOTE — Discharge Summary (Signed)
[facility] Discharge Summary  Patient name: Patrick Holt Medical record number: [id number] Date of birth: 07/03/1950 Age: 63 y.o. Gender: male Date of Admission: 01/03/2014  Date of Discharge: 01/06/14 Admitting Physician: Zigmund Gottron, MD  Primary Care Provider: Howard Pouch, DO Consultants: Neurology  Indication for Hospitalization: Right Sided Weakness  Discharge Diagnoses/Problem List:  Patient Active Problem List   Diagnosis Date Noted  . Right sided weakness 01/04/2014  . Weakness   . Type 2 diabetes mellitus without complication   . Essential hypertension, benign   . Atrial fibrillation, unspecified   . Numbness of arm   . Radiculopathy of cervical region   . Polyuria 12/07/2013  . History of oral surgery 12/07/2013  . Erectile dysfunction 12/07/2013  . Diverticulitis 08/01/2013  . Acute diverticulitis 08/01/2013  . Ankle pain, right 06/20/2013  . Hematochezia 06/04/2013  . Pain in joint, lower leg 12/08/2012  . Dehydration 11/09/2012  . Contact dermatitis 11/09/2012  . Hypokalemia 10/03/2012  . Benign neoplasm of colon 09/27/2012  . Stricture and stenosis of esophagus 09/27/2012  . Diverticulitis of colon without hemorrhage 07/26/2012  . Polypharmacy 03/26/2012  . Thought disorder 09-18-202014    Class: Chronic  . 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 Psychotic disorder 02/14/2012  . Seizures 02/10/2012  . Atrial fibrillation 02/05/2012  . Coronary atherosclerosis of native coronary artery 02/05/2012  . High risk social situation 10/15/2011  . Dental attrition, excessive 03/10/2011  . Radicular low back pain 03/09/2011  . History of seizure disorder 01/29/2011  . Irritable bowel syndrome (IBS) 06/24/2010  . Urge incontinence 06/19/2010  . TOBACCO USER 11/23/2008  . DEPRESSION, MAJOR 05/31/2008  . ISCHEMIC HEART DISEASE 05/31/2008  . RAYNAUD'S DISEASE 05/31/2008  . HIATAL HERNIA  05/31/2008  . Rheumatoid arthritis(714.0) 05/31/2008  . FIBROMYALGIA 05/31/2008  . Generalized pain 05/31/2008  . DIABETES MELLITUS, TYPE II 01/26/2008  . HYPERLIPIDEMIA 01/26/2008  . ANXIETY, CHRONIC 01/26/2008  . HYPERTENSION 01/26/2008  . HEMORRHOIDS, INTERNAL 01/26/2008  . COPD 01/26/2008  . ESOPHAGEAL MOTILITY DISORDER 01/26/2008  . GERD 01/26/2008  . DIVERTICULOSIS OF COLON 01/26/2008  . Dermatomyositis 01/26/2008     Disposition: home  Discharge Condition: stable  Brief Hospital Course:  Patrick Holt is a 63 y.o. male who presented with right sided weakness x 3 weeks with new onset left sided numbness. PMH is significant for COPD, CAD, afib, HTN, HLD, seizure disorder (possibly in the setting of Xanax withdraw), dermatomyositis, RA, subdural or subarachnoid hemorrhages noted on MRI in 2013.   His hospital course, by problem, is listed below:  #Right sided weakness: Neurology was consulted in the ED and the Patient was admitted for stroke work up. MRI, carotid dopplers, and echocardiogram were unrevealing. Risk stratification labs including TSH, lipid panel, and A1c were additionally unremarkable. MR of his c-spine showed Mild to moderate neural foraminal stenosis at the right C4 and left C8 nerve levels. OT and PT were consulted and recommended no follow-up with them.   #Chest pain: Patient complained of intermittent chest pain on admission. Cardiac work up was negative during this admission.  #Atrial fibrillation, CAD: CHADVASC score is 3 (HTN, DM, h/o MI) which is high risk-3.2%, however the patient is not on anticoagulation due to bleed history. He was in NSR on EKG here and continued on his home ASA and amiodarone. Outpatient cardiology follow-up was arranged to discuss anticoagulation as he was wary of the Eliquis recommended by neurology.  #COPD:  Occasional productive cough at patient's baseline on admission. CXR with no acute changes. Patient will not stop smoking  as doctors at [facility] told him to start smoking so that his "lungs would be covered with tar and he'd be protected against viruses and bacteria."   #Neuro/Psych: Continued home Depakote, Keppra, Xanax, and requip.  #HTN: Not on any medications at home. Stable BP here in the 140s-160s/80s-90s.   #HLD: Lipid panel here at goal. Continued home statin.   #Diabetes mellitus: A1c here 5.8  #Substance abuse: Patient smokes ~1/2ppd. Additionally, per the EMR he has a h/o alcohol abuse. Patient states he does not drink at all any more. He was placed on CIWA protocol with scores of 0-2 throughout his stay.  #Urinary frequency: This is a chronic problem for him. A UA was not suggestive of infection and we continued his home oxybutin  Issues for Follow Up:  - consider restart of long-acting inhaler (pt reportedly not taking combivent) - Eliquis recommended, patient has cards f/u to discuss further.  Significant Procedures: none  Significant Labs and Imaging:   Recent Labs Lab 01/03/14 2121  WBC 6.9  HGB 14.8  HCT 44.9  PLT 262    Recent Labs Lab 01/03/14 2121  NA 143  K 4.1  CL 106  CO2 23  GLUCOSE 128*  BUN 13  CREATININE 1.01  CALCIUM 9.0  ALKPHOS 81  AST 25  ALT 19  ALBUMIN 3.5   Troponin negative x3 A1c 5.8 TSH 3.370  Mr Cervical Spine W Wo Contrast  01/05/2014   CLINICAL DATA:  63 year old male with 3 weeks of right side weakness, left side sensory loss. Left arm paresthesia and right arm pain. Initial encounter.  EXAM: MRI CERVICAL SPINE WITHOUT AND WITH CONTRAST  TECHNIQUE: Multiplanar and multiecho pulse sequences of the cervical spine, to include the craniocervical junction and cervicothoracic junction, were obtained according to standard protocol without and with intravenous contrast.  CONTRAST:  37mL MULTIHANCE GADOBENATE DIMEGLUMINE 529 MG/ML IV SOLN  COMPARISON:  Brain MRI 01/04/2014. Cervical spine CT 12/1913 and earlier.  FINDINGS: Study is intermittently  degraded by motion artifact despite repeated imaging attempts.  Preserved cervical lordosis. No marrow edema or evidence of acute osseous abnormality.  Cervicomedullary junction is within normal limits. Spinal cord signal is within normal limits at all visualized levels. No abnormal enhancement identified.  Negative visualized posterior paraspinal soft tissues.  C2-C3:  Negative except for mild right uncovertebral hypertrophy.  C3-C4: Mild right facet and uncovertebral hypertrophy. Mild to moderate right C4 foraminal stenosis.  C4-C5:  Negative except for mild left uncovertebral hypertrophy.  C5-C6:  Negative except for mild right uncovertebral hypertrophy.  C6-C7:  Negative.  C7-T1: Mild to moderate facet hypertrophy on the left. Mild left C8 foraminal stenosis.  No upper thoracic spinal stenosis.  IMPRESSION: Minimal/mild for age cervical spine degeneration.  No significant disc disease or spinal stenosis.  Mild to moderate neural foraminal stenosis at the right C4 and left C8 nerve levels.   Electronically Signed   By: Lars Pinks M.D.   On: 01/05/2014 20:27    Results/Tests Pending at Time of Discharge: none  Discharge Medications:    Medication List    TAKE these medications        albuterol-ipratropium 18-103 MCG/ACT inhaler  Commonly known as:  COMBIVENT  Inhale 1 puff into the lungs daily.     ALPRAZolam 1 MG tablet  Commonly known as:  XANAX  Take 1 mg by mouth daily.  amiodarone 200 MG tablet  Commonly known as:  PACERONE  Take 1 tablet (200 mg total) by mouth daily before breakfast.     aspirin 81 MG chewable tablet  Chew 81 mg by mouth daily.     atorvastatin 40 MG tablet  Commonly known as:  LIPITOR  Take 1 tablet (40 mg total) by mouth daily.     divalproex 500 MG 24 hr tablet  Commonly known as:  DEPAKOTE ER  Take 1 tablet (500 mg total) by mouth daily.     esomeprazole 40 MG capsule  Commonly known as:  NEXIUM  Take 40 mg by mouth daily at 12 noon.      levETIRAcetam 500 MG tablet  Commonly known as:  KEPPRA  Take 500 mg by mouth 2 (two) times daily.     multivitamin with minerals Tabs tablet  Take 1 tablet by mouth daily.     ondansetron 4 MG disintegrating tablet  Commonly known as:  ZOFRAN ODT  4mg  ODT q4 hours prn nausea/vomit     oxybutynin 5 MG tablet  Commonly known as:  DITROPAN  TAKE 1 TABLET BY MOUTH THREE TIMES DAILY     potassium chloride SA 20 MEQ tablet  Commonly known as:  K-DUR,KLOR-CON  TAKE 1 TABLET BY MOUTH EVERY DAY     rOPINIRole 2 MG tablet  Commonly known as:  REQUIP  Take 2 mg by mouth 3 (three) times daily.        Discharge Instructions: Please refer to Patient Instructions section of EMR for full details.  Patient was counseled important signs and symptoms that should prompt return to medical care, changes in medications, dietary instructions, activity restrictions, and follow up appointments.   Follow-Up Appointments: Follow-up Information    Follow up with Dorris Carnes, MD.   Specialty:  Cardiology   Contact information:   [address] [place] [address] [phone]       Follow up with Howard Pouch, DO.   Specialty:  Family Medicine   Why:  For hosptial follow up appt.   Contact information:   [place] [address] [phone]       Follow up with Xu,Jindong, MD.   Specialty:  Neurology   Why:  Call for an appt for EMG/NCS.   Contact information:   [address] [place] [address] [phone]       Frazier Richards, MD 01/07/2014, 4:09 PM PGY-2, [place]

## 2014-01-04 NOTE — Progress Notes (Signed)
MRA of Brain ordered required patient to sign ABN form agreeing to pay for MRA.  Pt did not wish to do so at this time.

## 2014-01-04 NOTE — Progress Notes (Signed)
Paged by RN for Benadryl; patient endorses itching all over after the Norco.   Patient states he his itching all over. He states he told the nurse he was allergic to it. When I asked what pain medications exactly he was allergic to he couldn't tell me, and he could not tell me the name of the medication he got this morning, but tells me he's allergic to most of them.  No rashes that he knows of. No SOB or difficulty breathing. In the past he tells me numerous pain medicines have caused him to have itching. He cannot tell me which ones specifically.  He has a Rx for Norco from 05/2013 but tells me he doesn't recall getting it filled.  He tells me morphine has not worked in the past. He states he was discharged home in the past with a pill that was a combination of morphine and dilaudid that started with an "A."  He then goes on to talk about his teeth, his cardiologist, the frustration he has with his SCDs.  Blood pressure 148/89, pulse 53, temperature 98.8 F (37.1 C), temperature source Oral, resp. rate 20, height 5\' 5"  (1.651 m), weight 262 lb 4.8 oz (118.978 kg), SpO2 95 %.  Patient is in NAD, breathing comfortably RRR, no m/r/g noted No increased WOB. Mild wheezing (unchanged from admission). No rhonchi, or crackles noted  No rashes/hives noted  P: Will give Benadryl 25mg  IV. Will hold off on hydrocodone currently.

## 2014-01-04 NOTE — Consult Note (Addendum)
Consult    Chief Complaint: weakness and pain  HPI: Patrick Holt is an 63 y.o. male presenting with 3 weeks of right sided weakness, pain and now left sided sensory loss. He states that 3 weeks ago he noted numbness in his right arm along with difficulty closing his R hand. This then progressed to pain radiating up his R arm, into his neck, and into his temple. He feels that his RUE has gotten progressively weaker over the past few weeks. Also notes some mild weakness in RLE.  Tonight, the pain in the right neck got really bad. He also noted that his left arm has now become numb and he's had difficulty closing his left hand. 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   Has history significant for CAD, A fib, HTN, HLD, seizure disorder, subdural or subarachnoid on MRI in 2013. He is not on anticoagulation due to a bleeding history.    Past Medical History  Diagnosis Date  . Rheumatoid arthritis(714.0)   . Obesity   . Major depression     with acute psychotic break in 06/2010  . Hypertension   . Hyperlipidemia   . Diverticulosis of colon   . COPD (chronic obstructive pulmonary disease)   . Anxiety   . GERD (gastroesophageal reflux disease)   . Vertigo   . Fibromyalgia   . Dermatomyositis   . Myocardial infarct     mulitple (1999, 2000, 2004)  . Raynaud's disease   . Narcotic dependence   . Peripheral neuropathy   . Internal hemorrhoids   . Ischemic heart disease   . Hiatal hernia   . Gastritis   . Diverticulitis   . Hx of adenomatous colonic polyps   . Nephrolithiasis   . Anemia   . Esophageal stricture   . Esophageal dysmotility   . Dermatomyositis   . Urge incontinence   . Otosclerosis   . Bipolar 1 disorder   . OCD (obsessive compulsive disorder)   . Sarcoidosis   . Paroxysmal a-fib   . Dysrhythmia     "irregular" (11/15/2012)  . Type II diabetes  mellitus   . Seizures   . Headache(784.0)     "severe; get them often" (11/15/2012)  . Subarachnoid hemorrhage 01/2012    with subdural  hematoma.   . Atrial fibrillation 01/2012    with RVR  . Conversion disorder 06/2010  . History of narcotic addiction     Past Surgical History  Procedure Laterality Date  . Knee arthroscopy w/ meniscal repair Left 2009  . Lumbar disc surgery    . Squamous papilloma   2010    removed by Dr. Constance Holster ENT, noted on tongue  . Esophagogastroduodenoscopy N/A 09/27/2012    Procedure: ESOPHAGOGASTRODUODENOSCOPY (EGD);  Surgeon: Lafayette Dragon, MD;  Location: [facility];  Service: Endoscopy;  Laterality: N/A;  . Colonoscopy N/A 09/27/2012    Procedure: COLONOSCOPY;  Surgeon: Lafayette Dragon, MD;  Location: [facility];  Service: Endoscopy;  Laterality: N/A;  . Cataract extraction w/ intraocular lens implant Left   . Lymph node dissection Right 1970's    "neck; dr thought I had Hodgkins; turned out to be sarcoidosis" (11/15/2012)  . Tonsillectomy    . Carpal tunnel release Bilateral   . Cardiac catheterization      Family History  Problem Relation Age of Onset  . Alcohol abuse Mother   .  Depression Mother   . Heart disease Mother   . Diabetes Mother   . Stroke Mother   . Diabetes Other     1/2 brother  . Hepatitis Brother    Social History:  reports that he has been smoking Cigarettes.  He has a 15 pack-year smoking history. He has never used smokeless tobacco. He reports that he does not drink alcohol or use illicit drugs.  Allergies:  Allergies  Allergen Reactions  . Immune Globulins Other (See Comments)    Acute renal failure  . Ciprofloxacin Swelling  . Flagyl [Metronidazole] Swelling  . Lisinopril Diarrhea  . Sulfa Antibiotics Other (See Comments)    blisters  . Betamethasone Dipropionate     Unknown  . Bupropion Hcl     Unknown  . Clobetasol     Unknown  . Codeine     Unknown  . Escitalopram Oxalate     Unknown  . Fluoxetine Hcl      Unknown  . Fluticasone-Salmeterol     Unknown  . Furosemide     Unknown  . Paroxetine     Unknown  . Penicillins     Unknown  . Tacrolimus     Unknown  . Tetanus Toxoid     Unknown  . Tuberculin Purified Protein Derivative     Unknown     (Not in a hospital admission)  ROS: Out of a complete 14 system review, the patient complains of only the following symptoms, and all other reviewed systems are negative. + weakness, pain, sensory loss  Imaging personally reviewed CT head and C spine are unremarkable  Physical Examination: Blood pressure 115/99, pulse 62, temperature 98.3 F (36.8 C), temperature source Oral, resp. rate 19, height 5\' 5"  (1.651 m), weight 97.977 kg (216 lb), SpO2 96 %.   Neurologic Examination: Mental Status: Alert, oriented, thought content appropriate.  Speech fluent without evidence of aphasia.  Able to follow 3 step commands without difficulty. Cranial Nerves: II: unable to visualize fundi due to pupil size, visual fields grossly normal, fixed right pupil (chronic) III,IV, VI: ptosis not present, extra-ocular motions intact bilaterally V,VII: smile symmetric, facial light touch sensation decreased on right side VIII: hearing normal bilaterally IX,X: gag reflex present XI: trapezius strength/neck flexion strength normal bilaterally XII: tongue strength normal  Motor: Right : Upper extremity    Left:     Upper extremity 4-/5 deltoid       5/5 deltoid 4+/5 biceps      5/5 biceps  4+/5 triceps      5/5 triceps 4+/5 hand grip      5/5 hand grip  Lower extremity     Lower extremity 4-/5 hip flexor      5-/5 hip flexor 4-/5 quadricep      5-/5 quadriceps  4-/5 hamstrings     5-/5 hamstrings 4-/5 plantar flexion       5-/5 plantar flexion 4-/5 plantar extension     5-/5 plantar extension Mild rest tremor noted in right hand Sensory: decreased LT and pinprick on right side UE and LE Deep Tendon Reflexes: 3+ and symmetric  throughout Plantars: Right: equivocal  Left: downgoing Cerebellar: normal finger-to-nose on left, unable to complete on right Gait: deferred due to fall risk  Laboratory Studies:   Basic Metabolic Panel:  Recent Labs Lab 01/03/14 2121  NA 143  K 4.1  CL 106  CO2 23  GLUCOSE 128*  BUN 13  CREATININE 1.01  CALCIUM 9.0  Liver Function Tests:  Recent Labs Lab 01/03/14 2121  AST 25  ALT 19  ALKPHOS 81  BILITOT 0.3  PROT 7.0  ALBUMIN 3.5   No results for input(s): LIPASE, AMYLASE in the last 168 hours. No results for input(s): AMMONIA in the last 168 hours.  CBC:  Recent Labs Lab 01/03/14 2121  WBC 6.9  NEUTROABS 3.7  HGB 14.8  HCT 44.9  MCV 92.2  PLT 262    Cardiac Enzymes: No results for input(s): CKTOTAL, CKMB, CKMBINDEX, TROPONINI in the last 168 hours.  BNP: Invalid input(s): POCBNP  CBG: No results for input(s): GLUCAP in the last 168 hours.  Microbiology: Results for orders placed or performed during the hospital encounter of 06/03/13  Clostridium Difficile by PCR     Status: None   Collection Time: 06/04/13  9:52 AM  Result Value Ref Range Status   C difficile by pcr NEGATIVE NEGATIVE Final  Stool culture     Status: None   Collection Time: 06/04/13  9:52 AM  Result Value Ref Range Status   Specimen Description STOOL  Final   Special Requests NONE  Final   Culture   Final    NO SALMONELLA, SHIGELLA, CAMPYLOBACTER, YERSINIA, OR E.COLI 0157:H7 ISOLATED Performed at [facility]   Report Status 06/08/2013 FINAL  Final    Coagulation Studies: No results for input(s): LABPROT, INR in the last 72 hours.  Urinalysis: No results for input(s): COLORURINE, LABSPEC, PHURINE, GLUCOSEU, HGBUR, BILIRUBINUR, KETONESUR, PROTEINUR, UROBILINOGEN, NITRITE, LEUKOCYTESUR in the last 168 hours.  Invalid input(s): APPERANCEUR  Lipid Panel:     Component Value Date/Time   CHOL 157 12/29/2012 0425   TRIG 91 12/29/2012 0425   HDL 51  12/29/2012 0425   CHOLHDL 3.1 12/29/2012 0425   VLDL 18 12/29/2012 0425   LDLCALC 88 12/29/2012 0425    HgbA1C:  Lab Results  Component Value Date   HGBA1C 5.6 10/12/2013    Urine Drug Screen:     Component Value Date/Time   LABOPIA NONE DETECTED 11/09/2012 0010   COCAINSCRNUR NONE DETECTED 11/09/2012 0010   LABBENZ NONE DETECTED 11/09/2012 0010   AMPHETMU NONE DETECTED 11/09/2012 0010   THCU NONE DETECTED 11/09/2012 0010   LABBARB NONE DETECTED 11/09/2012 0010    Alcohol Level: No results for input(s): ETH in the last 168 hours.  Other results: NLZ:JQBHAL sinus rhythm  Imaging: Dg Chest 2 View  01/03/2014   CLINICAL DATA:  Left-sided weakness. Shortness of breath. Chest pain.  EXAM: CHEST  2 VIEW  COMPARISON:  05/11/2013  FINDINGS: Segmental elevation of the right hemidiaphragm, chronic. Mild hyperinflation. Normal heart size and pulmonary vascularity. No focal airspace disease in the lungs. No blunting of costophrenic angles. No pneumothorax. Calcified and tortuous aorta. Degenerative changes in the spine.  IMPRESSION: No active cardiopulmonary disease.   Electronically Signed   By: Lucienne Capers M.D.   On: 01/03/2014 21:49   Ct Head Wo Contrast  01/03/2014   CLINICAL DATA:  Right arm and hand weakness with right-sided neck pain and right-sided headache for 3 weeks. Pain worsening today.  EXAM: CT HEAD WITHOUT CONTRAST  CT CERVICAL SPINE WITHOUT CONTRAST  TECHNIQUE: Multidetector CT imaging of the head and cervical spine was performed following the standard protocol without intravenous contrast. Multiplanar CT image reconstructions of the cervical spine were also generated.  COMPARISON:  CT head 11/08/2012. CT head and cervical spine 10/16/2012.  FINDINGS: CT HEAD FINDINGS  Diffuse cerebral atrophy. No ventricular dilatation. Low-attenuation changes  throughout the deep white matter consistent with small vessel ischemia. No change in pattern since previous study. No mass effect  or midline shift. No abnormal extra-axial fluid collections. Gray-white matter junctions are distinct. Basal cisterns are not effaced. No evidence of acute intracranial hemorrhage. No depressed skull fractures. Mucosal thickening in the paranasal sinuses. Opacification of some of the ethmoid air cells. Mastoid air cells are not opacified. Vascular calcifications.  CT CERVICAL SPINE FINDINGS  Normal alignment of the cervical spine and facet joints. Degenerative changes in the cervical spine with narrowed interspace and hypertrophic changes mostly at C5-6 level. Intervertebral disc space heights are otherwise normal. No prevertebral soft tissue swelling. No vertebral compression fractures. Normal alignment of the facet joints. Old fracture of the spinous process of T1 is unchanged since prior study. C1-2 articulation appears intact. No focal bone lesion or bone destruction. Vascular calcifications in the cervical carotid arteries. Soft tissues are otherwise unremarkable. Suggestion of infiltration or atelectasis in the right lung apex. Emphysematous changes in the lung apices.  IMPRESSION: No acute intracranial abnormalities. Chronic atrophy and small vessel ischemic changes.  Normal alignment of the cervical spine. Degenerative changes. Old fracture of the T1 spinous process. No acute displaced fractures appreciated.   Electronically Signed   By: Lucienne Capers M.D.   On: 01/03/2014 22:41   Ct Cervical Spine Wo Contrast  01/03/2014   CLINICAL DATA:  Right arm and hand weakness with right-sided neck pain and right-sided headache for 3 weeks. Pain worsening today.  EXAM: CT HEAD WITHOUT CONTRAST  CT CERVICAL SPINE WITHOUT CONTRAST  TECHNIQUE: Multidetector CT imaging of the head and cervical spine was performed following the standard protocol without intravenous contrast. Multiplanar CT image reconstructions of the cervical spine were also generated.  COMPARISON:  CT head 11/08/2012. CT head and cervical spine  10/16/2012.  FINDINGS: CT HEAD FINDINGS  Diffuse cerebral atrophy. No ventricular dilatation. Low-attenuation changes throughout the deep white matter consistent with small vessel ischemia. No change in pattern since previous study. No mass effect or midline shift. No abnormal extra-axial fluid collections. Gray-white matter junctions are distinct. Basal cisterns are not effaced. No evidence of acute intracranial hemorrhage. No depressed skull fractures. Mucosal thickening in the paranasal sinuses. Opacification of some of the ethmoid air cells. Mastoid air cells are not opacified. Vascular calcifications.  CT CERVICAL SPINE FINDINGS  Normal alignment of the cervical spine and facet joints. Degenerative changes in the cervical spine with narrowed interspace and hypertrophic changes mostly at C5-6 level. Intervertebral disc space heights are otherwise normal. No prevertebral soft tissue swelling. No vertebral compression fractures. Normal alignment of the facet joints. Old fracture of the spinous process of T1 is unchanged since prior study. C1-2 articulation appears intact. No focal bone lesion or bone destruction. Vascular calcifications in the cervical carotid arteries. Soft tissues are otherwise unremarkable. Suggestion of infiltration or atelectasis in the right lung apex. Emphysematous changes in the lung apices.  IMPRESSION: No acute intracranial abnormalities. Chronic atrophy and small vessel ischemic changes.  Normal alignment of the cervical spine. Degenerative changes. Old fracture of the T1 spinous process. No acute displaced fractures appreciated.   Electronically Signed   By: Lucienne Capers M.D.   On: 01/03/2014 22:41    Assessment: 63 y.o. male with multiple risk factors including A fib (not on coumadin), HTN, DM, HLD presenting for evaluation of 3 weeks of right sided weakness and sensory loss. CHADS VASC score of 3, will need to further determine future bleeding risk of  being on  anticoagulation.  Of note he does have bilateral symptoms raising question of possible Cervical spine pathology but CT cervical appears unremarkable. If stroke workup unremarkable would consider MRI C spine.   Plan: 1. HgbA1c, fasting lipid panel 2. MRI, MRA  of the brain without contrast 3. PT consult, OT consult, Speech consult 4. Echocardiogram 5. Carotid dopplers 6. Prophylactic therapy-increase ASA to 325mg  daily 7. Risk factor modification 8. Telemetry monitoring 9. Frequent neuro checks 10. NPO until RN stroke swallow screen   Jim Like, DO [facility] [phone]  If 7pm- 7am, please page neurology on call as listed in [place]. 01/04/2014, 12:04 AM

## 2014-01-04 NOTE — Telephone Encounter (Signed)
New Prob    Pt calling to make Dr. Harrington Challenger aware he is currently in the hospital. Also, p states paperwork was recently faxed over to office regarding dental extractions and approval to have work done. Please call.

## 2014-01-04 NOTE — Progress Notes (Signed)
  Echocardiogram 2D Echocardiogram has been performed.  Annelies Coyt 01/04/2014, 8:55 AM

## 2014-01-04 NOTE — Progress Notes (Signed)
*  PRELIMINARY RESULTS* Vascular Ultrasound Carotid Duplex (Doppler) has been completed.  Preliminary findings: Bilateral:  1-39% ICA stenosis.  Vertebral artery flow is antegrade.      Landry Mellow, RDMS, RVT  01/04/2014, 9:52 AM

## 2014-01-05 ENCOUNTER — Inpatient Hospital Stay (HOSPITAL_COMMUNITY): Payer: Medicare HMO

## 2014-01-05 MED ORDER — GADOBENATE DIMEGLUMINE 529 MG/ML IV SOLN
20.0000 mL | Freq: Once | INTRAVENOUS | Status: AC | PRN
  Administered 2014-01-05: 20 mL via INTRAVENOUS

## 2014-01-05 MED ORDER — DIPHENHYDRAMINE HCL 50 MG/ML IJ SOLN
25.0000 mg | Freq: Four times a day (QID) | INTRAMUSCULAR | Status: DC | PRN
Start: 2014-01-05 — End: 2014-01-06
  Administered 2014-01-05 – 2014-01-06 (×2): 25 mg via INTRAVENOUS
  Filled 2014-01-05 (×2): qty 1

## 2014-01-05 NOTE — Plan of Care (Signed)
Problem: Discharge/Transitional Outcomes Goal: Other Discharge Outcomes/Goals Outcome: Not Applicable Date Met:  09/31/12

## 2014-01-05 NOTE — Evaluation (Signed)
Speech Language Pathology Evaluation Patient Details Name: Patrick Holt MRN: [id number] DOB: 09-18-1950 Today's Date: 01/05/2014 Time: 5427-0623 SLP Time Calculation (min) (ACUTE ONLY): 18 min  Problem List:  Patient Active Problem List   Diagnosis Date Noted  . Right sided weakness 01/04/2014  . Weakness   . Type 2 diabetes mellitus without complication   . Essential hypertension, benign   . Atrial fibrillation, unspecified   . Numbness of arm   . Radiculopathy of cervical region   . Polyuria 12/07/2013  . History of oral surgery 12/07/2013  . Erectile dysfunction 12/07/2013  . Diverticulitis 08/01/2013  . Acute diverticulitis 08/01/2013  . Ankle pain, right 06/20/2013  . Hematochezia 06/04/2013  . Pain in joint, lower leg 12/08/2012  . Dehydration 11/09/2012  . Contact dermatitis 11/09/2012  . Hypokalemia 10/03/2012  . Benign neoplasm of colon 09/27/2012  . Stricture and stenosis of esophagus 09/27/2012  . Diverticulitis of colon without hemorrhage 07/26/2012  . Polypharmacy 03/26/2012  . Thought disorder 02/27/202014    Class: Chronic  . 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 Psychotic disorder 02/14/2012  . Seizures 02/10/2012  . Atrial fibrillation 02/05/2012  . Coronary atherosclerosis of native coronary artery 02/05/2012  . High risk social situation 10/15/2011  . Dental attrition, excessive 03/10/2011  . Radicular low back pain 03/09/2011  . History of seizure disorder 01/29/2011  . Irritable bowel syndrome (IBS) 06/24/2010  . Urge incontinence 06/19/2010  . TOBACCO USER 11/23/2008  . DEPRESSION, MAJOR 05/31/2008  . ISCHEMIC HEART DISEASE 05/31/2008  . RAYNAUD'S DISEASE 05/31/2008  . HIATAL HERNIA 05/31/2008  . Rheumatoid arthritis(714.0) 05/31/2008  . FIBROMYALGIA 05/31/2008  . Generalized pain 05/31/2008  . DIABETES MELLITUS, TYPE II 01/26/2008  . HYPERLIPIDEMIA 01/26/2008  . ANXIETY, CHRONIC 01/26/2008  . HYPERTENSION  01/26/2008  . HEMORRHOIDS, INTERNAL 01/26/2008  . COPD 01/26/2008  . ESOPHAGEAL MOTILITY DISORDER 01/26/2008  . GERD 01/26/2008  . DIVERTICULOSIS OF COLON 01/26/2008  . Dermatomyositis 01/26/2008   Past Medical History:  Past Medical History  Diagnosis Date  . Rheumatoid arthritis(714.0)   . Obesity   . Major depression     with acute psychotic break in 06/2010  . Hypertension   . Hyperlipidemia   . Diverticulosis of colon   . COPD (chronic obstructive pulmonary disease)   . Anxiety   . GERD (gastroesophageal reflux disease)   . Vertigo   . Fibromyalgia   . Dermatomyositis   . Myocardial infarct     mulitple (1999, 2000, 2004)  . Raynaud's disease   . Narcotic dependence   . Peripheral neuropathy   . Internal hemorrhoids   . Ischemic heart disease   . Hiatal hernia   . Gastritis   . Diverticulitis   . Hx of adenomatous colonic polyps   . Nephrolithiasis   . Anemia   . Esophageal stricture   . Esophageal dysmotility   . Dermatomyositis   . Urge incontinence   . Otosclerosis   . Bipolar 1 disorder   . OCD (obsessive compulsive disorder)   . Sarcoidosis   . Paroxysmal a-fib   . Dysrhythmia     "irregular" (11/15/2012)  . Type II diabetes mellitus   . Seizures   . Headache(784.0)     "severe; get them often" (11/15/2012)  . Subarachnoid hemorrhage 01/2012    with subdural  hematoma.   . Atrial fibrillation 01/2012    with RVR  . Conversion disorder 06/2010  . History of narcotic addiction  Past Surgical History:  Past Surgical History  Procedure Laterality Date  . Knee arthroscopy w/ meniscal repair Left 2009  . Lumbar disc surgery    . Squamous papilloma   2010    removed by Dr. Constance Holster ENT, noted on tongue  . Esophagogastroduodenoscopy N/A 09/27/2012    Procedure: ESOPHAGOGASTRODUODENOSCOPY (EGD);  Surgeon: Lafayette Dragon, MD;  Location: [facility];  Service: Endoscopy;  Laterality: N/A;  . Colonoscopy N/A 09/27/2012    Procedure: COLONOSCOPY;  Surgeon:  Lafayette Dragon, MD;  Location: [facility];  Service: Endoscopy;  Laterality: N/A;  . Cataract extraction w/ intraocular lens implant Left   . Lymph node dissection Right 1970's    "neck; dr thought I had Hodgkins; turned out to be sarcoidosis" (11/15/2012)  . Tonsillectomy    . Carpal tunnel release Bilateral   . Cardiac catheterization     HPI:  Patrick Holt is a 63 y.o. male presenting with right sided weakness x 3 weeks with new onset left sided numbness. CT  head and cervical spine unremarkable. MRI brain without acute abnormality. PMH is significant for COPD, CAD, afib, HTN, HLD, seizure disorder (possibly in the setting of Xanax withdraw), dermatomyositis, RA, subdural or subarachnoid hemorrhages noted on MRI in 2013.    Assessment / Plan / Recommendation Clinical Impression  Pt has significant impairments with selective attention, which limits his ability to store new information and to participate in functional and structured tasks through completion. It is unclear what his baseline level of function is, however MRI was negative for acute abnormality. Recommend continued SLP services during acute stay to target functional cognition and maximize participation in care and recovery. Pt lives alone and would benefit from supervision upon discharge.     SLP Assessment  Patient needs continued Speech Lanaguage Pathology Services    Follow Up Recommendations  24 hour supervision/assistance    Frequency and Duration min 2x/week  1 week   Pertinent Vitals/Pain Pain Assessment:  (itching, RN aware)   SLP Goals  Patient/Family Stated Goal: wants benedryl Potential to Achieve Goals (ACUTE ONLY): Fair Potential Considerations (ACUTE ONLY): Previous level of function  SLP Evaluation Prior Functioning  Cognitive/Linguistic Baseline: Information not available Type of Home: Apartment  Lives With: Alone Available Help at Discharge:  (reports no support)   Cognition  Overall  Cognitive Status: No family/caregiver present to determine baseline cognitive functioning Arousal/Alertness: Awake/alert Orientation Level: Oriented to person;Oriented to place;Oriented to situation Attention: Selective Selective Attention: Impaired Selective Attention Impairment: Verbal basic;Functional basic Memory: Impaired Memory Impairment: Storage deficit;Decreased recall of new information Awareness: Impaired Awareness Impairment: Emergent impairment;Anticipatory impairment Problem Solving: Impaired Problem Solving Impairment: Functional basic;Verbal basic Behaviors: Perseveration;Other (comment) (tangential speech) Safety/Judgment: Impaired    Comprehension  Visual Recognition/Discrimination Discrimination: Not tested Reading Comprehension Reading Status: Not tested    Expression Expression Primary Mode of Expression: Verbal Verbal Expression Overall Verbal Expression: Impaired Initiation: No impairment Automatic Speech: Name;Social Response Level of Generative/Spontaneous Verbalization: Conversation Pragmatics: Impairment Impairments: Topic maintenance;Turn Taking Non-Verbal Means of Communication: Not applicable Written Expression Dominant Hand: Right Written Expression: Not tested   Oral / Motor Motor Speech Overall Motor Speech: Appears within functional limits for tasks assessed   GO      Germain Osgood, M.A. CCC-SLP [phone]  Germain Osgood 01/05/2014, 3:07 PM

## 2014-01-05 NOTE — Progress Notes (Signed)
STROKE TEAM PROGRESS NOTE   HISTORY Patrick Holt is an 63 y.o. male presenting with 3 weeks of right sided weakness, pain and now left sided sensory loss. He states that 3 weeks ago he noted numbness in his right arm along with difficulty closing his R hand. This then progressed to pain radiating up his R arm, into his neck, and into his temple. He feels that his RUE has gotten progressively weaker over the past few weeks. Also notes some mild weakness in RLE.  Tonight, the pain in the right neck got really bad. He also noted that his left arm has now become numb and he's had difficulty closing his left hand. 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   Has history significant for CAD, A fib, HTN, HLD, seizure disorder, subdural or subarachnoid on MRI in 2013. He is not on anticoagulation due to a bleeding history.   Patient was not administered TPA secondary to late presentation and previous intracranial hemorrhage.  SUBJECTIVE (INTERVAL HISTORY) Pt requests benadryl or other antihistamine for itching. The patient still has neck and right upper extremity pain.   OBJECTIVE Temp:  [97.6 F (36.4 C)-98.8 F (37.1 C)] 98.1 F (36.7 C) (11/21 1000) Pulse Rate:  [54-68] 56 (11/21 1000) Cardiac Rhythm:  [-] Normal sinus rhythm (11/20 2045) Resp:  [16-20] 18 (11/21 1000) BP: (125-171)/(81-111) 168/90 mmHg (11/21 1000) SpO2:  [92 %-95 %] 93 % (11/21 1000)  No results for input(s): GLUCAP in the last 168 hours.  Recent Labs Lab 01/03/14 2121  NA 143  K 4.1  CL 106  CO2 23  GLUCOSE 128*  BUN 13  CREATININE 1.01  CALCIUM 9.0    Recent Labs Lab 01/03/14 2121  AST 25  ALT 19  ALKPHOS 81  BILITOT 0.3  PROT 7.0  ALBUMIN 3.5    Recent Labs Lab 01/03/14 2121  WBC 6.9  NEUTROABS 3.7  HGB 14.8  HCT 44.9  MCV 92.2  PLT 262    Recent Labs Lab  01/04/14 0244 01/04/14 1055 01/04/14 1941  TROPONINI <0.30 <0.30 <0.30   No results for input(s): LABPROT, INR in the last 72 hours. No results for input(s): COLORURINE, LABSPEC, [place], GLUCOSEU, HGBUR, BILIRUBINUR, KETONESUR, PROTEINUR, UROBILINOGEN, NITRITE, LEUKOCYTESUR in the last 72 hours.  Invalid input(s): APPERANCEUR     Component Value Date/Time   CHOL 125 01/04/2014 0244   TRIG 116 01/04/2014 0244   HDL 45 01/04/2014 0244   CHOLHDL 2.8 01/04/2014 0244   VLDL 23 01/04/2014 0244   LDLCALC 57 01/04/2014 0244   Lab Results  Component Value Date   HGBA1C 5.8* 01/04/2014      Component Value Date/Time   LABOPIA NONE DETECTED 11/09/2012 0010   COCAINSCRNUR NONE DETECTED 11/09/2012 0010   LABBENZ NONE DETECTED 11/09/2012 0010   AMPHETMU NONE DETECTED 11/09/2012 0010   THCU NONE DETECTED 11/09/2012 0010   LABBARB NONE DETECTED 11/09/2012 0010    No results for input(s): ETH in the last 168 hours.   I have personally reviewed the radiological images below and agree with the radiology interpretations.  Carotid Dopplers - Bilateral: 1-39% ICA stenosis. Vertebral artery flow is antegrade.   2-D echocardiogram - Left ventricle: The cavity size was normal. There was moderate concentric hypertrophy. Systolic function was mildly reduced. The estimated ejection fraction was in the range of 45% to 50%. There is  akinesis of the basal-midinferior myocardium. Doppler parameters are consistent with abnormal left ventricular relaxation (grade 1 diastolic dysfunction). - Mitral valve: Calcified annulus. - Left atrium: The atrium was moderately dilated. - Right ventricle: The cavity size was mildly dilated. Wall thickness was normal. - Right atrium: The atrium was mildly dilated. - Pulmonary arteries: Systolic pressure was moderately increased. PA peak pressure: 60 mm Hg (S). Impressions: - When compared to prior, EF is reduced and inferior wall  motion abnormality appears new.  Dg Chest 2 View 01/03/2014    No active cardiopulmonary disease.   Ct Head Wo Contrast 01/03/2014    No acute intracranial abnormalities. Chronic atrophy and small vessel ischemic changes.    Ct Cervical Spine Wo Contrast 01/03/2014    Normal alignment of the cervical spine. Degenerative changes. Old fracture of the T1 spinous process. No acute displaced fractures appreciated.     MRI Brain 01/04/2014 1. No acute intracranial abnormality. 2. Moderate chronic small vessel ischemic disease and cerebral atrophy.  MRI of cervical spine - pending   PHYSICAL EXAM  Temp:  [97.6 F (36.4 C)-98.8 F (37.1 C)] 98.1 F (36.7 C) (11/21 1000) Pulse Rate:  [54-68] 56 (11/21 1000) Resp:  [16-20] 18 (11/21 1000) BP: (125-171)/(81-111) 168/90 mmHg (11/21 1000) SpO2:  [92 %-95 %] 93 % (11/21 1000)  General - Well nourished, well developed, in no apparent distress.  Ophthalmologic - not able to see through.  Cardiovascular - Regular rate and rhythm with no murmur.  Neck - tenderness on palpation of cervical vertebrae, R>L.  Mental Status -  Level of arousal and orientation to time, place, and person were intact. Language including expression, naming, repetition, comprehension was assessed and found intact.  Cranial Nerves II - XII - II - Visual field intact OU. III, IV, VI - Extraocular movements intact. V - on light touch right face decreased sensation about 80% of left. VII - Facial movement intact bilaterally. VIII - Hearing & vestibular intact bilaterally. X - Palate elevates symmetrically. XI - Chin turning & shoulder shrug intact bilaterally. XII - Tongue protrusion intact.  Motor Strength - The patient's strength was RUE 3+/5 proximal but limited significantly due to pain, distally 3/5 and pronator drift was present on the right.  LUE 5/5. RLE 4/5 and LLE 5/5. Bulk was normal and fasciculations were absent.  Motor Tone - Muscle tone was  assessed at the neck and appendages and was normal.  Reflexes - The patient's reflexes were 1+ in all extremities and he had no pathological reflexes.  Sensory - Light touch, temperature/pinprick and were normal decreased on the RUE about 20% of LUE, RLE about 50% of the LLE, however, the sensation are patchy and subjective.    Coordination - The patient had normal movements in the hands with no ataxia or dysmetria.  Tremor was absent.  Gait and Station - not tested.   ASSESSMENT/PLAN Mr. LIEUTENANT ABARCA is a 63 y.o. male with history of atrial fibrillation not anticoagulated, coronary artery disease, hypertension, hyperlipidemia, seizure disorder, and subdural or subarachnoid hemorrhage in 2013 presenting with right arm weakness, pain, and numbness for 3 weeks and then progressed to left arm numbness and shooting pain on left leg. He did not receive IV t-PA due to late presentation and previous intracranial hemorrhage. MRI brain negative for infarct, and there is functional component on his exam. Presentation concerning for cervical spine pathology, likely cervical radiculopathy but need C-spine MRI to rule out myelopathy. If negative, he needs PT/OT  and outpt EMG/NCS.   He has very small bifrontal SDH in 01/2012 admission after a grand mal seizure with fall. CT head negative and repeat MRI showed small bifrontal SDH. I still think it was trauma related. He has afib with intermittent RVR and his CHADS-VASc = 3 and HAS-BLED score = 2 (see below), I would recommend anticoagulation with eliquis which seems to have least bleeding risk among NOACs. However, I would encourage primary team to discuss with his cardiology to see if we can be in agreement.  Bilateral arm paresthesia - considering bilateral cervical radiculopathy   Resultant - bilateral sensory deficits, right hemiparesis  MRI Brain- No acute intracranial abnormality.  MRA - Not ordered  MRI Cervical Spine - pending  Carotid  Doppler - Preliminary findings: Bilateral: 1-39% ICA stenosis. Vertebral artery flow is antegrade.   2D Echo ejection fraction was in the range of 45% to 50%. Thereis akinesis of the basal-midinferior myocardium.  LDL 57, on the goal < 70  HgbA1c - 5.8  SCDs for DVT prophylaxis  Diet heart healthy/carb modified with thin liquids  aspirin 81 mg orally every day prior to admission, now on aspirin 325 mg orally every day  Ongoing aggressive risk factor management  Therapy recommendations:  Pending  Disposition:  Pending  Recommend MRI of the cervical spine  Recommend outpatient EMG and nerve conduction study if negative for myelopathy.  Afib not on anticoagulation - hx of persistent afib and intermittent afib with RVR - not on anticoagulation due to SDH in 2013  - however, 01/2012 he had very small bilateral frontal SDH in the setting of seizure followed by fall, likely traumatic. And also CT negative, SDH was picked up by MRI.  - calculated CHADS-VASc = 3 and HAS-BLED = 2, stroke risk overweighs bleeding risk. (see below) - recommend anticoagulation with eliquis which may be the least risk of bleeding among NOACs - please discuss with his cardiologist to see if we can be in agreement.   CHADS-VASc score         Condition Points   C CHF (or Left ventricular systolic dysfunction) 0/1   H HTN: BP consistently above 140/90 mmHg (or treated HTN on meds)  1/1   A2 Age ?11 years 0/2   D DM 0/1   S2 Prior Stroke or TIA or thromboembolism 1/2   V Vascular disease (e.g. PAD, MI, aortic plaque) 1/1   A Age 17-74 years 0/1   [place] male sex  0/1           Annual Stroke Risk CHA2DS2-VASc Score Stroke Risk % 95% CI  0 0  1 1.3  2 2.2  3 3.2  4 4.0  5 6.7  6 9.8  7 9.6  8 12.5  9 15.2      HAS-BLED score   Condition Points   H HTN: uncontrolled, >540 mmHg systolic 0/1   A2 Abnormal renal function: Dialysis, transplant, Cr >2.6 mg/dL or >200 mol/L Abnormal liver  function: Cirrhosis or Bilirubin >2x Normal or AST/ALT/AP >3x Normal 0/1 0/1   S Prior history of stroke  1/1   B Bleeding: prior major bleeding or predisposition to bleeding 0/1  L Labile INR: unstable / high INRs, time in therapeutic range < 60% 0/1  E Elderly: age > 65 years Medication Usage Predisposing to Bleeding: (Antiplatelet agents, NSAIDs) 0/1 1/1  D  Prior alcohol or drug usage history 0/1    HAS-BLED score n Bleeds, n Bleeds/100 patients  0 798 9 1.13  1 1286 13 1.02  2 744 14 1.88  3 187 7 3.74  4 46 4 8.70  5 8 1  12.50   Seizure  - hx of seizure - last seizure in 2013 - on keppra and depakote - seizure precautions.  Hypertension  Home meds: No antihypertensive medications prior to admission.  Occasional mildly elevated blood pressures but otherwise stable.  Diabetes mellitus  Hemoglobin A1c - 5.8  Controlled  Hyperlipidemia  Home meds:  Lipitor - resumed in hospital  LDL 57, goal < 70  Continue statin at discharge  Other Stroke Risk Factors  Advanced age  Cigarette smoker, advised to stop smoking  Obesity, Body mass index is 43.65 kg/(m^2).   Family hx stroke (mother)  Coronary artery disease  Other Active Problems  History of diabetes mellitus   Other Pertinent History  Dermatomyositis history  Fibromyalgia    Stroke team will sign off. Please call if we can be of further service. Please call if MRI of C spine shows compression. If not plan outpt EMG / NCS for radiculopathy.   Hospital day # 2  Mikey Bussing PA-C [facility] Pager [phone] 01/05/2014, 11:06 AM  I have personally examined this patient, reviewed notes, independently viewed imaging studies, participated in medical decision making and plan of care. I have made any additions or clarifications directly to the above note. Agree with note above.No subjective & objective note has been filed under this hospital service for this encounter.   symptoms of right neck and UE pain and paresthesias do not seem to be of neurovascular nature and w/u has been negative . Recommend complete C spine mRI and outpatient f/u with neurology as needed for EMG NCV or neuropathic pain management  Antony Contras, MD Medical Director [place] Pager: [phone] 01/05/2014 3:51 PM    To contact Stroke Continuity provider, please refer to [email or web address] After hours, contact General Neurology

## 2014-01-05 NOTE — Plan of Care (Signed)
Problem: Progression Outcomes Goal: Communication method established Outcome: Completed/Met Date Met:  01/05/14 Goal: Progressive activity as tolerated Outcome: Progressing Goal: Tolerating diet/TF at goal rate Outcome: Completed/Met Date Met:  01/05/14 Goal: Pain controlled Outcome: Progressing Goal: Bowel & Bladder Continence Outcome: Completed/Met Date Met:  01/05/14 Goal: Educational plan initiated Outcome: Completed/Met Date Met:  01/05/14 Goal: Initial discharge plan initiated Outcome: Progressing Goal: Other Progression Outcomes Outcome: Progressing

## 2014-01-05 NOTE — Progress Notes (Signed)
Family Medicine Teaching Service Daily Progress Note Intern Pager: [phone]  Patient name: Patrick Holt Medical record number: [id number] Date of birth: 04/07/1950 Age: 63 y.o. Gender: male  Primary Care Provider: Howard Pouch, DO Consultants: Neurology Code Status: Full  Pt Overview and Major Events to Date:  11/19 Admitted for stroke work up given right sided weakness  Assessment and Plan: Patrick Holt is a 63 y.o. male presenting with right sided weakness x 3 weeks with new onset left sided numbness. PMH is significant for COPD, CAD, afib, HTN, HLD, seizure disorder (possibly in the setting of Xanax withdraw), dermatomyositis, RA, subdural or subarachnoid hemorrhages noted on MRI in 2013.   #Right sided weakness: CT of head without any acute changes and cervical spine CT unremarkable. Patient does have a h/o afib and but is not on anticoagulation 2/2 prior intracerebral hemorrhages. Other risk factors include HTN and HLD.  - MRI brain: No acute abnormality, carotid dopplers without significant stenosis - echo - EF 45-50 (slightly reduced from previous), inferior wall motion abnormality - TSH wnl, lipid panel at goal, A1C 5.8 - continue ASA 81mg   - neurology following, appreciate recs - ordered for cervical spine MRI to investigate weaknes / neck pain, consider NOAC (?Eliquis) but likely will need to f/u with cardiology outpt for final determination (pt equivocal about whether or not he would be willing to take an anticoagulant) - consult PT/OT/Speech   #Chest pain: Intermittent chest pain. Limited history secondary to patient's perseverative and tangential speech. Known hx of CAD with prior MI's.  - Troponin negative x2  #Atrial fibrillation, CAD: Nuclear stress test in 02/2013 showed possible old infarct or diaphragmatic attenuation with an otherwise normal perfusion. CHADVASC score is 3 (HTN, DM, h/o MI) which is high risk-3.2%- (even if you consider his DM resolved as  he's been controlled so well without medications, his risk is still elevated at 2.2%).The patient is not on anticoagulation due to bleed history.  - in NSR in the ED - Continue home ASA  - continue amiodarone - monitor on telemetry  #COPD: Patient endorses occasional productive cough, however appears to be at his baseline. CXR with no acute changes. Patient will not stop smoking as doctors at [facility] told him to start smoking so that his "lungs would be covered with tar and he'd be protected against viruses and bacteria."  - consider restart of long-acting inhaler (pt reportedly not taking combivent)  #Neuro/Psych: Patient well maintained on Depakote, Keppra and Xanax. Has had seizures in the past but it was in the setting of Xanax withdraw. Valproic acid level decreased on presentation. - continue home Depakote, Keppra, and Xanax - continue Requip   #HTN: Patient not on an antihypertensive currently with stable BPs. Was prescribed lisinopril in the past but discontinued it due to diarrhea.  - continue to monitor  #HLD: - Continue home Lipitor  - lipid panel at goal  #Diabetes mellitus: currently well controlled without medication (was previously on glipizide). Last A1c was 5.6, now 5.8.  #Substance abuse: Patient smokes ~1/2ppd. Additionally, per the EMR he has a h/o alcohol abuse. Patient states he does not drink at all any more. -Patient allergic to nicotine patch  -place on CIWA protocol  #Urinary frequency: - Continue oxybutynin  - f/u UA  FEN/GI: heart healthy / carb modified, Protonix, KVO fluids now taking PO Prophylaxis: SCDs given h/o bleed  Disposition: Management as above, with discharge planning pending completion of neuro work-up  Subjective: No acute events.  Still with some neck pain / arm weakness and pain. No chest pain or shortness of breath. No fevers or chills.  Objective: Temp:  [97.6 F (36.4 C)-98.8 F (37.1 C)] 98.1 F (36.7 C) (11/21  0551) Pulse Rate:  [54-68] 54 (11/21 0551) Resp:  [16-20] 20 (11/21 0551) BP: (125-171)/(72-111) 162/81 mmHg (11/21 0551) SpO2:  [92 %-95 %] 94 % (11/20 2109) Physical Exam: General: 63 y/o male lying in bed in NAD HEENT: Atraumatic. L pupil round and reactive to light. R pupil not reactive to light (chronic). EOMI without nystagmus. Dry MM. Very poor dentition with only a few small broken teeth on the bottom.  Cardiovascular: Regular rhythm. No murmurs appreciated.   No carotid bruits noted Respiratory: NWOB. Mild wheezing noted bilaterally. No crackles. Abdomen: +BS, S, NTND.  Extremities: Trace pitting edema b/l.  Skin: No rashes noted Neuro: A&Ox4. Speech clear and coherent. Smile symmetric. Face symmetric. Tongue midline. Strength 3/5 on the right, 4/5 on the left, ?secondary to pain vs poor effort  Laboratory:  Recent Labs Lab 01/03/14 2121  WBC 6.9  HGB 14.8  HCT 44.9  PLT 262    Recent Labs Lab 01/03/14 2121  NA 143  K 4.1  CL 106  CO2 23  BUN 13  CREATININE 1.01  CALCIUM 9.0  PROT 7.0  BILITOT 0.3  ALKPHOS 81  ALT 19  AST 25  GLUCOSE 128*    Troponin neg TSH 3.370  Imaging/Diagnostic Tests: Dg Chest 2 View 01/03/2014   IMPRESSION: No active cardiopulmonary disease.     Ct Head Wo Contrast 01/03/2014   IMPRESSION: No acute intracranial abnormalities. Chronic atrophy and small vessel ischemic changes.  Normal alignment of the cervical spine. Degenerative changes. Old fracture of the T1 spinous process. No acute displaced fractures appreciated.     Ct Cervical Spine Wo Contrast 01/03/2014   IMPRESSION: No acute intracranial abnormalities. Chronic atrophy and small vessel ischemic changes.  Normal alignment of the cervical spine. Degenerative changes. Old fracture of the T1 spinous process. No acute displaced fractures appreciated.     Mr Brain Wo Contrast 01/04/2014    IMPRESSION: 1. No acute intracranial abnormality. 2. Moderate chronic small vessel  ischemic disease and cerebral atrophy.    Sharon Mt [address], MD 01/05/2014, 8:26 AM PGY-3, [place] Intern pager: [phone], text pages welcome

## 2014-01-05 NOTE — Plan of Care (Signed)
Problem: Progression Outcomes Goal: Pain controlled Outcome: Progressing

## 2014-01-05 NOTE — Plan of Care (Signed)
Problem: Progression Outcomes Goal: Progressive activity as tolerated Outcome: Progressing

## 2014-01-05 NOTE — Progress Notes (Signed)
Occupational Therapy Evaluation Patient Details Name: Patrick Holt MRN: [id number] DOB: [birth year] Today's Date: 01/05/2014    History of Present Illness Patrick Holt is an 63 y.o. male presenting with 3 weeks of right sided weakness, pain and now left sided sensory loss. MRI is negative.   Clinical Impression   PTA pt lived at home alone and reports difficulty with LB ADLs (donning socks). Pt also reports significant difficulty with IADLs including homekeeping and grocery shopping. Pt is concerned about his ability to get home from the hospital as he has no family or friend support. Pt has a HHRN who comes to his house 1x/week. Pt needs community support and would benefit from [facility] and CSW to help connect him to resources (possibly meals on wheels?). Pt will benefit from acute OT to increase independence prior to d/c.     Follow Up Recommendations  Other (comment);No OT follow up ([facility], [facility] CSW, possible referral to Meals on Wheels?)    Equipment Recommendations  None recommended by OT    Recommendations for Other Services       Precautions / Restrictions Precautions Precautions: Fall Restrictions Weight Bearing Restrictions: No      Mobility Bed Mobility Overal bed mobility: Modified Independent                Transfers Overall transfer level: Needs assistance Equipment used: Rolling walker (2 wheeled) Transfers: Sit to/from Stand Sit to Stand: Min guard              Balance Overall balance assessment: Needs assistance Sitting-balance support: No upper extremity supported;Feet supported Sitting balance-Leahy Scale: Good     Standing balance support: Bilateral upper extremity supported;During functional activity Standing balance-Leahy Scale: Fair                              ADL Overall ADL's : Needs assistance/impaired Eating/Feeding: Set up;Sitting   Grooming: Set up;Sitting   Upper Body Bathing: Set up;Sitting   Lower  Body Bathing: Minimal assistance;Sit to/from stand   Upper Body Dressing : Set up;Sitting   Lower Body Dressing: Sit to/from stand;Minimal assistance Lower Body Dressing Details (indicate cue type and reason): pt requires (A) with socks and reports that he has "a great deal of difficulty" with donning/doffing socks. Demonstrated use of sock aid with pt able to return demo.  Toilet Transfer: Min guard;Ambulation;RW           Functional mobility during ADLs: Min guard;Rolling walker       Vision  Pt reports no change from baseline.                    Perception Perception Perception Tested?: No   Praxis Praxis Praxis tested?: Within functional limits    Pertinent Vitals/Pain Pain Assessment: 0-10 Pain Score: 6  Faces Pain Scale: Hurts little more Pain Location: RUE from "shoulder to fingers" Pain Descriptors / Indicators: Sharp Pain Intervention(s): Monitored during session;Repositioned     Hand Dominance Right   Extremity/Trunk Assessment Upper Extremity Assessment Upper Extremity Assessment: RUE deficits/detail;LUE deficits/detail RUE Deficits / Details: pt reports pain throughout RUE. Pt unable to close fist fully.  RUE: Unable to fully assess due to pain RUE Coordination: decreased fine motor;decreased gross motor LUE Deficits / Details: pt reports decreased sensation and tingling from ~elbow down to fingers. Pt with impaired light touch.  LUE Sensation: decreased light touch   Lower Extremity Assessment Lower Extremity Assessment:  Overall [facility] for tasks assessed   Cervical / Trunk Assessment Cervical / Trunk Assessment: Normal   Communication Communication Communication: No difficulties   Cognition Arousal/Alertness: Awake/alert Behavior During Therapy: WFL for tasks assessed/performed Overall Cognitive Status: History of cognitive impairments - at baseline (perseveration and decreased attention)       Memory: Decreased short-term memory                         Home Living Family/patient expects to be discharged to:: Private residence Living Arrangements: Alone Available Help at Discharge: Other (Comment) (reports no support aside from Patrick Holt who comes 1x/week) Type of Home: Apartment Home Access: Ramped entrance     Home Layout: One level     Bathroom Shower/Tub: Occupational psychologist: Standard     Home Equipment: Cane - single point;Wheelchair - power      Lives With: Alone    Prior Functioning/Environment Level of Independence: Independent with assistive device(s)        Comments: pt reports significant difficulty with IADLs including housekeeping and grocery shopping as he does not drive and has limited mobility.     OT Diagnosis: Generalized weakness;Acute pain;Other (comment) (impaired sensation)   OT Problem List: Decreased strength;Decreased activity tolerance;Impaired balance (sitting and/or standing);Decreased safety awareness;Decreased knowledge of use of DME or AE;Decreased knowledge of precautions;Impaired sensation;Impaired UE functional use;Pain   OT Treatment/Interventions: Self-care/ADL training;Therapeutic exercise;Energy conservation;DME and/or AE instruction;Therapeutic activities;Patient/family education;Balance training    OT Goals(Current goals can be found in the care plan section) Acute Rehab OT Goals Patient Stated Goal: to have help getting home OT Goal Formulation: With patient Time For Goal Achievement: 01/19/14 Potential to Achieve Goals: Good ADL Goals Pt Will Perform Lower Body Bathing: with set-up;with supervision;with adaptive equipment;sit to/from stand Pt Will Perform Lower Body Dressing: with set-up;with supervision;with adaptive equipment;sit to/from stand Pt Will Transfer to Toilet: with supervision;ambulating;bedside commode Pt Will Perform Toileting - Clothing Manipulation and hygiene: with supervision;sit to/from stand  OT Frequency: Min 2X/week     End of Session Equipment Utilized During Treatment: Rolling walker;Other (comment) (sock aid)  Activity Tolerance: Patient tolerated treatment well Patient left: in bed;with call bell/phone within reach;with bed alarm set   Time: 1712-1747 OT Time Calculation (min): 35 min Charges:  OT General Charges $OT Visit: 1 Procedure OT Evaluation $Initial OT Evaluation Tier I: 1 Procedure OT Treatments $Self Care/Home Management : 8-22 mins $Therapeutic Activity: 8-22 mins  Juluis Rainier 01/05/2014, 6:01 PM   Cyndie Chime, OTR/L Occupational Therapist [phone] (pager)

## 2014-01-05 NOTE — Evaluation (Signed)
Physical Therapy Evaluation Patient Details Name: ENNIS Holt MRN: [id number] DOB: 29-Jun-1950 Today's Date: 01/05/2014   History of Present Illness  Patrick Holt is an 63 y.o. male presenting with 3 weeks of right sided weakness, pain and now left sided sensory loss. MRI is negative.  Clinical Impression  Pt admitted with above. Pt currently with functional limitations due to the deficits listed below (see PT Problem List).  Pt will benefit from skilled PT to increase their independence and safety with mobility to allow discharge to the venue listed below. PT to follow pt acutely.  No f/u services recommended.      Follow Up Recommendations No PT follow up    Equipment Recommendations  Rolling walker with 5" wheels    Recommendations for Other Services       Precautions / Restrictions        Mobility  Bed Mobility Overal bed mobility: Modified Independent                Transfers Overall transfer level: Needs assistance Equipment used: Rolling walker (2 wheeled) Transfers: Sit to/from Stand Sit to Stand: Min guard            Ambulation/Gait Ambulation/Gait assistance: Min guard Ambulation Distance (Feet): 150 Feet Assistive device: Rolling walker (2 wheeled) Gait Pattern/deviations: WFL(Within Functional Limits) Gait velocity: decreased      Stairs            Wheelchair Mobility    Modified Rankin (Stroke Patients Only)       Balance Overall balance assessment: Needs assistance Sitting-balance support: No upper extremity supported;Feet supported Sitting balance-Leahy Scale: Good     Standing balance support: Bilateral upper extremity supported;During functional activity Standing balance-Leahy Scale: Fair                               Pertinent Vitals/Pain Pain Assessment: Faces Faces Pain Scale: Hurts little more Pain Location: right UE Pain Intervention(s): Monitored during session    Home Living  Family/patient expects to be discharged to:: Private residence Living Arrangements: Alone Available Help at Discharge:  (reports no support) Type of Home: Apartment Home Access: Ramped entrance     Home Layout: One level Home Equipment: Cane - single point;Electric scooter      Prior Function Level of Independence: Independent with assistive device(s)               Hand Dominance   Dominant Hand: Right    Extremity/Trunk Assessment   Upper Extremity Assessment: Defer to OT evaluation           Lower Extremity Assessment: Overall WFL for tasks assessed      Cervical / Trunk Assessment: Normal  Communication   Communication: No difficulties  Cognition Arousal/Alertness: Awake/alert Behavior During Therapy: WFL for tasks assessed/performed Overall Cognitive Status: History of cognitive impairments - at baseline (perseveration and decreased attention)       Memory: Decreased short-term memory              General Comments      Exercises        Assessment/Plan    PT Assessment Patient needs continued PT services  PT Diagnosis Acute pain;Difficulty walking   PT Problem List Decreased activity tolerance;Decreased balance;Decreased mobility;Decreased cognition;Pain  PT Treatment Interventions DME instruction;Gait training;Functional mobility training;Therapeutic activities;Therapeutic exercise;Patient/family education;Cognitive remediation;Balance training   PT Goals (Current goals can be found in the Care Plan section) Acute  Rehab PT Goals Patient Stated Goal: stop itching PT Goal Formulation: With patient Time For Goal Achievement: 01/19/14 Potential to Achieve Goals: Good    Frequency Min 3X/week   Barriers to discharge Decreased caregiver support      Co-evaluation               End of Session Equipment Utilized During Treatment: Gait belt Activity Tolerance: Patient tolerated treatment well Patient left: in chair;with call  bell/phone within reach;with chair alarm set Nurse Communication: Mobility status         Time: 4239-5320 PT Time Calculation (min) (ACUTE ONLY): 21 min   Charges:   PT Evaluation $Initial PT Evaluation Tier I: 1 Procedure PT Treatments $Gait Training: 8-22 mins   PT G Codes:          Lorriane Shire 01/05/2014, 3:11 PM

## 2014-01-05 NOTE — Plan of Care (Signed)
Problem: Progression Outcomes Goal: Other Progression Outcomes Outcome: Not Applicable Date Met:  47/09/29

## 2014-01-05 NOTE — Plan of Care (Signed)
Problem: Progression Outcomes Goal: Initial discharge plan initiated Outcome: Progressing

## 2014-01-06 NOTE — Progress Notes (Signed)
Family Medicine Teaching Service Daily Progress Note Intern Pager: [phone]  Patient name: Patrick Holt Medical record number: [id number] Date of birth: 1950-11-16 Age: 63 y.o. Gender: male  Primary Care Provider: Howard Pouch, DO Consultants: Neurology Code Status: Full  Pt Overview and Major Events to Date:  11/19 Admitted for stroke work up given right sided weakness  Assessment and Plan: Patrick Holt is a 63 y.o. male presenting with right sided weakness x 3 weeks with new onset left sided numbness. PMH is significant for COPD, CAD, afib, HTN, HLD, seizure disorder (possibly in the setting of Xanax withdraw), dermatomyositis, RA, subdural or subarachnoid hemorrhages noted on MRI in 2013.   #Right sided weakness:  - MRI brain: No acute abnormality, carotid dopplers without significant stenosis; MRI cervical spine w/ degenerative changes/neuroforaminal stenosis.  - Echo - EF 45-50 (slightly reduced from previous), inferior wall motion abnormality - TSH wnl, lipid panel at goal, A1C 5.8 - Carotid duplex - 1-39% stenosis of right & left internal carotid artery - Neurology following; MRI cervical spine w/o myelopathy. Neuro recommeding Eliquis.  Patient unsure if he wants to proceed with anticoagulation.  Will have patient follow up with cardiology for continued discussion regarding this. - No source of weakness found.  Patient stable for discharge home.   #Chest pain: Intermittent chest pain. Limited history secondary to patient's perseverative and tangential speech. Known hx of CAD with prior MI's.  - Troponin negative x 3.  #Atrial fibrillation, CAD:  - Continue home ASA and amio. - Neurology recommending Eliquis (if okay with cardiology).  #COPD - Stable w/ no signs of exacerbation. - Will continue to monitor.   #Neuro/Psych: Patient well maintained on Depakote, Keppra and Xanax. Has had seizures in the past but it was in the setting of Xanax withdraw. Valproic acid  level decreased on presentation. - continue home Depakote, Keppra, and Xanax - continue Requip   #HTN: Patient not on an antihypertensive currently. - Will continue to monitor. - Will need outpatient follow up.   #HLD: - Continue home Lipitor   #Diabetes mellitus: Diet controlled. Last A1c was 5.6, now 5.8.  #Substance abuse: Patient smokes ~1/2ppd. Additionally, per the EMR he has a h/o alcohol abuse. Patient states he does not drink at all any more. -Patient allergic to nicotine patch  -CIWA protocol  #Urinary frequency: - Continue oxybutynin   FEN/GI: heart healthy / carb modified, Protonix, KVO fluids. Prophylaxis: SCDs.   Disposition: Planning for D/C home today.   Subjective:  Continues to report right sided weakness.  Objective: Temp:  [97.5 F (36.4 C)-98.6 F (37 C)] 98.4 F (36.9 C) (11/22 0947) Pulse Rate:  [54-66] 54 (11/22 0947) Resp:  [18-20] 20 (11/22 0947) BP: (149-181)/(80-99) 158/80 mmHg (11/22 0947) SpO2:  [92 %-96 %] 94 % (11/22 0947)  Physical Exam: General: resting in bed, NAD.  HEENT: NCAT. EOMI.  Cardiovascular: RRR. No m/r/g. Respiratory: NWOB. Clear anteriorly.  Abdomen: soft, nontender, nondistended.  Extremities: No LE edema.  Neuro: AO x 3. Sensation grossly intact.  RUE - 3-4/5 (? Effort dependent), LUE - 5/5; LLE 5/5; RLE 4/5.  Laboratory:  Recent Labs Lab 01/03/14 2121  WBC 6.9  HGB 14.8  HCT 44.9  PLT 262    Recent Labs Lab 01/03/14 2121  NA 143  K 4.1  CL 106  CO2 23  BUN 13  CREATININE 1.01  CALCIUM 9.0  PROT 7.0  BILITOT 0.3  ALKPHOS 81  ALT 19  AST 25  GLUCOSE  128*   Cardiac Panel (last 3 results)  Recent Labs  01/04/14 0244 01/04/14 1055 01/04/14 1941  TROPONINI <0.30 <0.30 <0.30   Lab Results  Component Value Date   TSH 3.370 01/04/2014   Imaging/Diagnostic Tests: Dg Chest 2 View 01/03/2014   IMPRESSION: No active cardiopulmonary disease.     Ct Head Wo Contrast 01/03/2014    IMPRESSION: No acute intracranial abnormalities. Chronic atrophy and small vessel ischemic changes.  Normal alignment of the cervical spine. Degenerative changes. Old fracture of the T1 spinous process. No acute displaced fractures appreciated.     Ct Cervical Spine Wo Contrast 01/03/2014   IMPRESSION: No acute intracranial abnormalities. Chronic atrophy and small vessel ischemic changes.  Normal alignment of the cervical spine. Degenerative changes. Old fracture of the T1 spinous process. No acute displaced fractures appreciated.     Mr Brain Wo Contrast 01/04/2014    IMPRESSION: 1. No acute intracranial abnormality. 2. Moderate chronic small vessel ischemic disease and cerebral atrophy.    MRI C spine IMPRESSION: Minimal/mild for age cervical spine degeneration. No significant disc disease or spinal stenosis. Mild to moderate neural foraminal stenosis at the right C4 and left C8 nerve levels.  Coral Spikes, DO 01/06/2014, 10:07 AM PGY-3, [facility] Intern pager: [phone], text pages welcome

## 2014-01-06 NOTE — Discharge Instructions (Signed)
Your work up for your weakness was negative. Please continue to follow up closely with your PCP and Dr. Harrington Challenger.  You need to see Dr. Harrington Challenger to continue discussion about anticoagulation. Call for an outpatient appt with Neurology.

## 2014-01-06 NOTE — Progress Notes (Signed)
Patient given DC instructions and writer answered all questions. Patient put jacket on independantly. Writer escorted patient in [facility] to waiting cab. Patient required no assistance getting into cab. Cab to transport patient to his residence.

## 2014-01-06 NOTE — Plan of Care (Signed)
Problem: Progression Outcomes Goal: Progressive activity as tolerated Outcome: Completed/Met Date Met:  01/06/14 Goal: Pain controlled Outcome: Progressing Goal: Initial discharge plan initiated Outcome: Completed/Met Date Met:  01/06/14  Problem: Discharge/Transitional Outcomes Goal: Barriers To Progression Addressed/Resolved Outcome: Progressing Goal: Educational Plan Complete Outcome: Progressing Goal: Hemodynamically stable Outcome: Completed/Met Date Met:  01/06/14 Goal: Independent mobility/functioning independent or with min Independent mobility/functioning independently or with minimal assistance  Outcome: Adequate for Discharge

## 2014-01-07 NOTE — Telephone Encounter (Signed)
New message  Pt called states that---Scott Jenson an oral surgeon faxed a form to our office [facility]  discuss if the pt should go into the hospital because his teeth are broken to his gums. Pt also states that he was taking lisinopril and was advised to stop if he has diarrhea. Pt stopped taking the Lisinopril but his primary care has instructed him to restart taking the medication. Please call back to discus these matters.

## 2014-01-08 NOTE — Telephone Encounter (Signed)
Follow Up  Pt called again to address possible admission to hosp; to also discuss changing of med from lisinopril to eliquis; please call pt back to discuss

## 2014-01-08 NOTE — Telephone Encounter (Signed)
Pt states he was discharged from hospital last Sunday. Patient needs post hosptial visit with dr Harrington Challenger per dc notes from hospital. Appointment made with Estella Husk 01/23/14.  Notes say he should take Eliquis if ok w/ cardiology. He does not have a script for it.  Needs to have remaining teeth extracted (broken down to gums) by Jodene Nam, [phone] (fax [phone]) He would like Dr. Harrington Challenger to provide surgical clearance and to help determine if he should be hospitalized for this surgery due to his complicated medical history. Advised that we have not received any clearance form from them.  Aware that I am routing this message to Dr. Harrington Challenger to inform.

## 2014-01-08 NOTE — Telephone Encounter (Signed)
Follow Up        Pt calling wanting to know what the status is of the form from his oral surgeon. Pt nor the other office has heard anything. Please call pt and advise.

## 2014-01-09 ENCOUNTER — Telehealth: Payer: Self-pay | Admitting: Internal Medicine

## 2014-01-09 MED ORDER — APIXABAN 5 MG PO TABS
5.0000 mg | ORAL_TABLET | Freq: Two times a day (BID) | ORAL | Status: DC
Start: 2014-01-09 — End: 2014-01-23

## 2014-01-09 NOTE — Telephone Encounter (Signed)
He can start eliquis and stp aspirin I cannot see before 12/9  Needs to be seen before extraction.   I am not sure that I have seen note from surgeon.  May be on my in stack

## 2014-01-09 NOTE — Telephone Encounter (Signed)
Pt is aware to start Eliquis 5 mg twice a day and stop aspirin . Prescription was send to [facility] pharmacy. Pt is aware.

## 2014-01-09 NOTE — Telephone Encounter (Signed)
Follow Up       Pt calling to give correct pharmacy to [place]. Pharmacy is [facility] at [address], phone number [phone]. Please sent rx to correct pharmacy.

## 2014-01-09 NOTE — Telephone Encounter (Signed)
Patient seen in cardiology 1 year ago Admitted with CVA  Seen by neuror  They recomm Eliquis Hx subdural hematoma in past  Smalll  After fall  would recomm 5 bid eliquis  WIll need outpatient f/u of renal function and CBC He needs to keep app with Gerrianne Scale before discuss clearance for dental work Stop ASA.

## 2014-01-09 NOTE — Telephone Encounter (Signed)
Pt is aware that Dr. Harrington Challenger recommends for him 5 mg of Eliquis BID and that he needs O/P blood work. Pt states he does not drive and can't come for just blood work he will wait to have the labs when he comes for appointment with Ermalinda Barrios 01/23/14. Pt is also aware that he needs to keep the incoming appointment before discussing Clearance for dental work. Pt states that his teeth are killing him. He will wait if he is not death before then. Labs orders for BMET and LFT placed in epic appointment for labs done. Dr Harrington Challenger also said for pt to Stop ASA ? When start Eliquis ?

## 2014-01-14 ENCOUNTER — Encounter (HOSPITAL_COMMUNITY): Payer: Self-pay | Admitting: *Deleted

## 2014-01-14 ENCOUNTER — Emergency Department (HOSPITAL_COMMUNITY)
Admission: EM | Admit: 2014-01-14 | Discharge: 2014-01-14 | Disposition: A | Discharge status: Home / Self Care | Payer: Commercial Managed Care - HMO | Attending: Emergency Medicine | Admitting: Emergency Medicine

## 2014-01-14 DIAGNOSIS — E669 Obesity, unspecified: Secondary | ICD-10-CM | POA: Insufficient documentation

## 2014-01-14 DIAGNOSIS — Z8739 Personal history of other diseases of the musculoskeletal system and connective tissue: Secondary | ICD-10-CM | POA: Diagnosis not present

## 2014-01-14 DIAGNOSIS — K219 Gastro-esophageal reflux disease without esophagitis: Secondary | ICD-10-CM | POA: Insufficient documentation

## 2014-01-14 DIAGNOSIS — Z79899 Other long term (current) drug therapy: Secondary | ICD-10-CM | POA: Insufficient documentation

## 2014-01-14 DIAGNOSIS — E119 Type 2 diabetes mellitus without complications: Secondary | ICD-10-CM | POA: Diagnosis not present

## 2014-01-14 DIAGNOSIS — R112 Nausea with vomiting, unspecified: Secondary | ICD-10-CM

## 2014-01-14 DIAGNOSIS — F319 Bipolar disorder, unspecified: Secondary | ICD-10-CM | POA: Insufficient documentation

## 2014-01-14 DIAGNOSIS — K2970 Gastritis, unspecified, without bleeding: Secondary | ICD-10-CM | POA: Diagnosis not present

## 2014-01-14 DIAGNOSIS — I10 Essential (primary) hypertension: Secondary | ICD-10-CM | POA: Diagnosis not present

## 2014-01-14 DIAGNOSIS — Z862 Personal history of diseases of the blood and blood-forming organs and certain disorders involving the immune mechanism: Secondary | ICD-10-CM | POA: Diagnosis not present

## 2014-01-14 DIAGNOSIS — Z880 Allergy status to penicillin: Secondary | ICD-10-CM | POA: Diagnosis not present

## 2014-01-14 DIAGNOSIS — G40909 Epilepsy, unspecified, not intractable, without status epilepticus: Secondary | ICD-10-CM | POA: Insufficient documentation

## 2014-01-14 DIAGNOSIS — I252 Old myocardial infarction: Secondary | ICD-10-CM | POA: Insufficient documentation

## 2014-01-14 DIAGNOSIS — R197 Diarrhea, unspecified: Secondary | ICD-10-CM | POA: Diagnosis not present

## 2014-01-14 DIAGNOSIS — K920 Hematemesis: Secondary | ICD-10-CM | POA: Diagnosis present

## 2014-01-14 DIAGNOSIS — Z720 Tobacco use: Secondary | ICD-10-CM | POA: Diagnosis not present

## 2014-01-14 DIAGNOSIS — J449 Chronic obstructive pulmonary disease, unspecified: Secondary | ICD-10-CM | POA: Insufficient documentation

## 2014-01-14 DIAGNOSIS — I4891 Unspecified atrial fibrillation: Secondary | ICD-10-CM | POA: Insufficient documentation

## 2014-01-14 LAB — URINALYSIS, ROUTINE W REFLEX MICROSCOPIC
BILIRUBIN URINE: NEGATIVE
GLUCOSE, UA: NEGATIVE mg/dL
HGB URINE DIPSTICK: NEGATIVE
Ketones, ur: NEGATIVE mg/dL
Nitrite: NEGATIVE
Protein, ur: NEGATIVE mg/dL
SPECIFIC GRAVITY, URINE: 1.02 (ref 1.005–1.030)
Urobilinogen, UA: 0.2 mg/dL (ref 0.0–1.0)
pH: 8 (ref 5.0–8.0)

## 2014-01-14 LAB — COMPREHENSIVE METABOLIC PANEL
ALT: 15 U/L (ref 0–53)
AST: 16 U/L (ref 0–37)
Albumin: 3.6 g/dL (ref 3.5–5.2)
Alkaline Phosphatase: 81 U/L (ref 39–117)
Anion gap: 11 (ref 5–15)
BUN: 11 mg/dL (ref 6–23)
CALCIUM: 9 mg/dL (ref 8.4–10.5)
CHLORIDE: 105 meq/L (ref 96–112)
CO2: 28 meq/L (ref 19–32)
CREATININE: 1 mg/dL (ref 0.50–1.35)
GFR calc Af Amer: 90 mL/min — ABNORMAL LOW (ref >90)
GFR, EST NON AFRICAN AMERICAN: 78 mL/min — AB (ref >90)
Glucose, Bld: 80 mg/dL (ref 70–99)
Potassium: 4.2 mEq/L (ref 3.7–5.3)
Sodium: 144 mEq/L (ref 137–147)
Total Bilirubin: 0.6 mg/dL (ref 0.3–1.2)
Total Protein: 7 g/dL (ref 6.0–8.3)

## 2014-01-14 LAB — TYPE AND SCREEN
ABO/RH(D): O POS
ANTIBODY SCREEN: NEGATIVE

## 2014-01-14 LAB — CBC WITH DIFFERENTIAL/PLATELET
BASOS PCT: 0 % (ref 0–1)
Basophils Absolute: 0 10*3/uL (ref 0.0–0.1)
EOS PCT: 3 % (ref 0–5)
Eosinophils Absolute: 0.2 10*3/uL (ref 0.0–0.7)
HEMATOCRIT: 46.4 % (ref 39.0–52.0)
HEMOGLOBIN: 15 g/dL (ref 13.0–17.0)
Lymphocytes Relative: 40 % (ref 12–46)
Lymphs Abs: 2.6 10*3/uL (ref 0.7–4.0)
MCH: 29.2 pg (ref 26.0–34.0)
MCHC: 32.3 g/dL (ref 30.0–36.0)
MCV: 90.3 fL (ref 78.0–100.0)
MONOS PCT: 10 % (ref 3–12)
Monocytes Absolute: 0.6 10*3/uL (ref 0.1–1.0)
NEUTROS ABS: 3.1 10*3/uL (ref 1.7–7.7)
Neutrophils Relative %: 47 % (ref 43–77)
Platelets: 250 10*3/uL (ref 150–400)
RBC: 5.14 MIL/uL (ref 4.22–5.81)
RDW: 14.3 % (ref 11.5–15.5)
WBC: 6.6 10*3/uL (ref 4.0–10.5)

## 2014-01-14 LAB — POC OCCULT BLOOD, ED: Fecal Occult Bld: NEGATIVE

## 2014-01-14 LAB — ABO/RH: ABO/RH(D): O POS

## 2014-01-14 LAB — URINE MICROSCOPIC-ADD ON

## 2014-01-14 LAB — PROTIME-INR
INR: 1.08 (ref 0.00–1.49)
Prothrombin Time: 14.1 seconds (ref 11.6–15.2)

## 2014-01-14 NOTE — ED Notes (Signed)
Ambulated pt in hallway with walker. Pt's gait was steady. Pt stated that he felt weak.

## 2014-01-14 NOTE — ED Provider Notes (Signed)
Care assumed from Dr. Dina Rich. Hemoglobin stable. Hemoccult negative. Vital stable. Orthostatics negative.  Patient with EGD and colonoscopy August 2014. Showed gastritis, esophageal stricture, diverticulosis, and colonic polyps.  D/w [facility] residents who know patient well.  They agree he appears stable for discharge and outpatient followup.  No evidence of acute GI bleed.  Spoke with Nancy Marus of [facility]. She recommends stopping eliquis after discussing with PCP. D/w Dr. Wendi Snipes of [facility].  He agrees with having patient hold eliquis at this time.  Discussed with patient. He will stop the eliquis. Go back to 81 mg aspirin daily. He understands this could increase his slight stroke risk slightly. Benefits of stopping medication appear to outweigh risk.   He will follow up with PCP and GI this week. Return precautions discussed.  BP 143/92 mmHg  Pulse 61  Temp(Src) 98.8 F (37.1 C) (Oral)  Resp 15  Ht 5\' 5"  (1.651 m)  Wt 222 lb (100.699 kg)  BMI 36.94 kg/m2  SpO2 92%     Patrick Essex, MD 01/14/14 1305

## 2014-01-14 NOTE — ED Notes (Signed)
Pt is transferring from ED to home via [place]. PT. Is in stable condition upon d/c

## 2014-01-14 NOTE — ED Provider Notes (Signed)
CSN: [id number]     Arrival date & time 01/14/14  0909 History   First MD Initiated Contact with Patient 01/14/14 [phone]     Chief Complaint  Patient presents with  . Hematemesis  . Diarrhea     (Consider location/radiation/quality/duration/timing/severity/associated sxs/prior Treatment) HPI  This is a 63 year old male with a history of hypertension, hyperlipidemia, diverticulitis, COPD, dermatomyositis, CAD and recent workup for stroke who presents with hematemesis and diarrhea. Patient reports that he was recently changed from aspirin to Eliquis. He started Eliquis Sunday. After 1 dose he had one episode of emesis that was "a lot of blood." He is unable to quantify how much blood because it was in the toilet.  He had no further episodes of emesis. This morning he had one episode of diarrhea that was "dark". He did not note any blood in it. At baseline he states that he has had abdominal pain since March and has been evaluated by his GI doctor multiple times. He has a history of diverticulitis. He denies any fevers. He denies any history of cirrhosis or ulcers.  He is currently not on any NSAIDs or steroids.  Past Medical History  Diagnosis Date  . Rheumatoid arthritis(714.0)   . Obesity   . Major depression     with acute psychotic break in 06/2010  . Hypertension   . Hyperlipidemia   . Diverticulosis of colon   . COPD (chronic obstructive pulmonary disease)   . Anxiety   . GERD (gastroesophageal reflux disease)   . Vertigo   . Fibromyalgia   . Dermatomyositis   . Myocardial infarct     mulitple (1999, 2000, 2004)  . Raynaud's disease   . Narcotic dependence   . Peripheral neuropathy   . Internal hemorrhoids   . Ischemic heart disease   . Hiatal hernia   . Gastritis   . Diverticulitis   . Hx of adenomatous colonic polyps   . Nephrolithiasis   . Anemia   . Esophageal stricture   . Esophageal dysmotility   . Dermatomyositis   . Urge incontinence   . Otosclerosis   .  Bipolar 1 disorder   . OCD (obsessive compulsive disorder)   . Sarcoidosis   . Paroxysmal a-fib   . Dysrhythmia     "irregular" (11/15/2012)  . Type II diabetes mellitus   . Seizures   . Headache(784.0)     "severe; get them often" (11/15/2012)  . Subarachnoid hemorrhage 01/2012    with subdural  hematoma.   . Atrial fibrillation 01/2012    with RVR  . Conversion disorder 06/2010  . History of narcotic addiction    Past Surgical History  Procedure Laterality Date  . Knee arthroscopy w/ meniscal repair Left 2009  . Lumbar disc surgery    . Squamous papilloma   2010    removed by Dr. Constance Holster ENT, noted on tongue  . Esophagogastroduodenoscopy N/A 09/27/2012    Procedure: ESOPHAGOGASTRODUODENOSCOPY (EGD);  Surgeon: Lafayette Dragon, MD;  Location: [facility];  Service: Endoscopy;  Laterality: N/A;  . Colonoscopy N/A 09/27/2012    Procedure: COLONOSCOPY;  Surgeon: Lafayette Dragon, MD;  Location: [facility];  Service: Endoscopy;  Laterality: N/A;  . Cataract extraction w/ intraocular lens implant Left   . Lymph node dissection Right 1970's    "neck; dr thought I had Hodgkins; turned out to be sarcoidosis" (11/15/2012)  . Tonsillectomy    . Carpal tunnel release Bilateral   . Cardiac catheterization    .  Back surgery     Family History  Problem Relation Age of Onset  . Alcohol abuse Mother   . Depression Mother   . Heart disease Mother   . Diabetes Mother   . Stroke Mother   . Diabetes Other     1/2 brother  . Hepatitis Brother    History  Substance Use Topics  . Smoking status: Current Every Day Smoker -- 0.50 packs/day for 30 years    Types: Cigarettes  . Smokeless tobacco: Never Used  . Alcohol Use: No     Comment: Alcohol stopped in September of 2014    Review of Systems  Constitutional: Negative.  Negative for fever.  Respiratory: Negative.  Negative for chest tightness and shortness of breath.   Cardiovascular: Negative.  Negative for chest pain.  Gastrointestinal:  Positive for nausea, vomiting, abdominal pain and diarrhea. Negative for abdominal distention.       Dark stools, hematemesis  Genitourinary: Negative.  Negative for dysuria.  Musculoskeletal: Negative for back pain.  Skin: Negative for rash.  Neurological: Negative for headaches.  All other systems reviewed and are negative.     Allergies  Immune globulins; Ciprofloxacin; Flagyl; Lisinopril; Sulfa antibiotics; Betamethasone dipropionate; Bupropion hcl; Clobetasol; Codeine; Escitalopram oxalate; Fluoxetine hcl; Fluticasone-salmeterol; Furosemide; Paroxetine; Penicillins; Tacrolimus; Tetanus toxoid; and Tuberculin purified protein derivative  Home Medications   Prior to Admission medications   Medication Sig Start Date End Date Taking? Authorizing Provider  albuterol-ipratropium (COMBIVENT) 18-103 MCG/ACT inhaler Inhale 1 puff into the lungs daily. 06/20/13   Gerda Diss, DO  ALPRAZolam Duanne Moron) 1 MG tablet Take 1 mg by mouth daily.     Historical Provider, MD  amiodarone (PACERONE) 200 MG tablet Take 1 tablet (200 mg total) by mouth daily before breakfast. 06/20/13   Gerda Diss, DO  apixaban (ELIQUIS) 5 MG TABS tablet Take 1 tablet (5 mg total) by mouth 2 (two) times daily. 01/09/14   Fay Records, MD  atorvastatin (LIPITOR) 40 MG tablet Take 1 tablet (40 mg total) by mouth daily. 10/25/13   Renee A Kuneff, DO  divalproex (DEPAKOTE ER) 500 MG 24 hr tablet Take 1 tablet (500 mg total) by mouth daily. 06/20/13   Gerda Diss, DO  esomeprazole (NEXIUM) 40 MG capsule Take 40 mg by mouth daily at 12 noon.    Historical Provider, MD  levETIRAcetam (KEPPRA) 500 MG tablet Take 500 mg by mouth 2 (two) times daily.  01/10/13   Gerda Diss, DO  Multiple Vitamin (MULTIVITAMIN WITH MINERALS) TABS tablet Take 1 tablet by mouth daily. 12/29/12   Kandis Nab, MD  ondansetron (ZOFRAN ODT) 4 MG disintegrating tablet 4mg  ODT q4 hours prn nausea/vomit 10/09/13   Mariea Clonts, MD  oxybutynin  (DITROPAN) 5 MG tablet TAKE 1 TABLET BY MOUTH THREE TIMES DAILY 10/18/13   Renee A Kuneff, DO  potassium chloride SA (K-DUR,KLOR-CON) 20 MEQ tablet TAKE 1 TABLET BY MOUTH EVERY DAY 10/18/13   Renee A Kuneff, DO  rOPINIRole (REQUIP) 2 MG tablet Take 2 mg by mouth 3 (three) times daily.    Historical Provider, MD   BP 163/130 mmHg  Pulse 61  Temp(Src) 98.8 F (37.1 C) (Oral)  Resp 13  Ht 5\' 5"  (1.651 m)  Wt 222 lb (100.699 kg)  BMI 36.94 kg/m2  SpO2 95% Physical Exam  Constitutional: He is oriented to person, place, and time. No distress.  HENT:  Head: Normocephalic and atraumatic.  Eyes: Pupils are equal, round, and  reactive to light.  Neck: Neck supple.  Cardiovascular: Normal rate, regular rhythm and normal heart sounds.   No murmur heard. Pulmonary/Chest: Effort normal and breath sounds normal. No respiratory distress. He has no wheezes.  Abdominal: Soft. Bowel sounds are normal. There is tenderness. There is no rebound and no guarding.  Diffuse tenderness to palpation without rebound or guarding  Musculoskeletal: He exhibits no edema.  Lymphadenopathy:    He has no cervical adenopathy.  Neurological: He is alert and oriented to person, place, and time.  Skin: Skin is warm and dry.  Psychiatric: He has a normal mood and affect.  Nursing note and vitals reviewed.   ED Course  Procedures (including critical care time) Labs Review Labs Reviewed  CBC WITH DIFFERENTIAL  COMPREHENSIVE METABOLIC PANEL  PROTIME-INR  POC OCCULT BLOOD, ED  TYPE AND SCREEN    Imaging Review No results found.   EKG Interpretation   Date/Time:  Monday January 14 2014 09:21:31 EST Ventricular Rate:  61 PR Interval:  178 QRS Duration: 89 QT Interval:  468 QTC Calculation: 471 R Axis:   33 Text Interpretation:  Sinus rhythm Confirmed by HORTON  MD, COURTNEY  ([id number]) on 01/14/2014 9:30:15 AM      MDM   Final diagnoses:  None    Patient presents with hematemesis and diarrhea.  VS  stable.  NO active vomiting on exam.  Recent change to Eliquis.  Orthostatics neg and hemoglobin stable.  Hemoccult pending.  Patient with recent EGD with gastritis and followed by GI. Suspect if all other labwork reassuring, patient can likely follow-up with PCP and GI physician.  Signed out to Dr. Wyvonnia Dusky.    Merryl Hacker, MD 01/14/14 [phone]

## 2014-01-14 NOTE — ED Notes (Addendum)
Pt. Reports from home via [facility]. Pt. Has c/o hematemesis x1, and dark diarrhea x1. Pt. Reports he has just started taking a new medication, Eliquis.

## 2014-01-14 NOTE — ED Notes (Signed)
Patient tolerating PO fluids without issue. 

## 2014-01-14 NOTE — Discharge Instructions (Signed)
Diarrhea Stop taking eliquis and go back to 81 mg aspirin.  Follow up with Dr. Olevia Perches and Dr. Raoul Pitch. Return to the ED if you develop worsening nausea, vomiting, diarrhea or bleeding. Any chest pain, any shortness of breath any dizziness or any other symptoms. Diarrhea is frequent loose and watery bowel movements. It can cause you to feel weak and dehydrated. Dehydration can cause you to become tired and thirsty, have a dry mouth, and have decreased urination that often is dark yellow. Diarrhea is a sign of another problem, most often an infection that will not last long. In most cases, diarrhea typically lasts 2-3 days. However, it can last longer if it is a sign of something more serious. It is important to treat your diarrhea as directed by your caregiver to lessen or prevent future episodes of diarrhea. CAUSES  Some common causes include:  Gastrointestinal infections caused by viruses, bacteria, or parasites.  Food poisoning or food allergies.  Certain medicines, such as antibiotics, chemotherapy, and laxatives.  Artificial sweeteners and fructose.  Digestive disorders. HOME CARE INSTRUCTIONS  Ensure adequate fluid intake (hydration): Have 1 cup (8 oz) of fluid for each diarrhea episode. Avoid fluids that contain simple sugars or sports drinks, fruit juices, whole milk products, and sodas. Your urine should be clear or pale yellow if you are drinking enough fluids. Hydrate with an oral rehydration solution that you can purchase at pharmacies, retail stores, and online. You can prepare an oral rehydration solution at home by mixing the following ingredients together:   - tsp table salt.   tsp baking soda.   tsp salt substitute containing potassium chloride.  1  tablespoons sugar.  1 L (34 oz) of water.  Certain foods and beverages may increase the speed at which food moves through the gastrointestinal (GI) tract. These foods and beverages should be avoided and include:  Caffeinated  and alcoholic beverages.  High-fiber foods, such as raw fruits and vegetables, nuts, seeds, and whole grain breads and cereals.  Foods and beverages sweetened with sugar alcohols, such as xylitol, sorbitol, and mannitol.  Some foods may be well tolerated and may help thicken stool including:  Starchy foods, such as rice, toast, pasta, low-sugar cereal, oatmeal, grits, baked potatoes, crackers, and bagels.  Bananas.  Applesauce.  Add probiotic-rich foods to help increase healthy bacteria in the GI tract, such as yogurt and fermented milk products.  Wash your hands well after each diarrhea episode.  Only take over-the-counter or prescription medicines as directed by your caregiver.  Take a warm bath to relieve any burning or pain from frequent diarrhea episodes. SEEK IMMEDIATE MEDICAL CARE IF:   You are unable to keep fluids down.  You have persistent vomiting.  You have blood in your stool, or your stools are black and tarry.  You do not urinate in 6-8 hours, or there is only a small amount of very dark urine.  You have abdominal pain that increases or localizes.  You have weakness, dizziness, confusion, or light-headedness.  You have a severe headache.  Your diarrhea gets worse or does not get better.  You have a fever or persistent symptoms for more than 2-3 days.  You have a fever and your symptoms suddenly get worse. MAKE SURE YOU:   Understand these instructions.  Will watch your condition.  Will get help right away if you are not doing well or get worse. Document Released: 01/22/2002 Document Revised: 06/18/2013 Document Reviewed: 10/10/2011 [facility] Patient Information 2015 [place], [place]. This  information is not intended to replace advice given to you by your health care provider. Make sure you discuss any questions you have with your health care provider. ° °

## 2014-01-14 NOTE — Telephone Encounter (Signed)
States he started Eliquis on 01/13/2014. Took 2 doses. Vomited blood last pm. Had reddish black diarrhea this am Has no family to take him to the hospital. Advised him to call ems for transport immediately. He voices good understanding and will do this as soon as we hang up.

## 2014-01-14 NOTE — Telephone Encounter (Signed)
New message  Pt states that he was at the hospital was prescribed a RX, He reports he got it filled and as of 11/29 he has vomited blood and has dark diarrhea. He is not sure if it is from the medication. Please call back to discuss.   Pt is also requesting clearance for his dental procedure.

## 2014-01-15 ENCOUNTER — Telehealth: Payer: Self-pay | Admitting: Family Medicine

## 2014-01-15 NOTE — Telephone Encounter (Signed)
Patrick Holt called and wanted to know if we had called or decided which home health care agency is going to do Patrick Holt's home health care. Please call which one today so that this can be scheduled. Patrick Holt is only a Tourist information centre manager and she can not call to have it arranged. jw

## 2014-01-18 ENCOUNTER — Other Ambulatory Visit: Payer: Self-pay | Admitting: Sports Medicine

## 2014-01-21 ENCOUNTER — Telehealth: Payer: Self-pay | Admitting: Family Medicine

## 2014-01-21 NOTE — Telephone Encounter (Signed)
Pt needs to speak with RN about his alprazolam, only has 1 left, thinks the pharmacy made a mistake b/c the rx says no refills until 12/14, pt says he took them as prescribed.

## 2014-01-22 ENCOUNTER — Ambulatory Visit: Payer: Self-pay | Admitting: Internal Medicine

## 2014-01-23 ENCOUNTER — Other Ambulatory Visit (INDEPENDENT_AMBULATORY_CARE_PROVIDER_SITE_OTHER): Payer: Commercial Managed Care - HMO | Admitting: *Deleted

## 2014-01-23 ENCOUNTER — Encounter: Payer: Self-pay | Admitting: Physician Assistant

## 2014-01-23 ENCOUNTER — Ambulatory Visit (INDEPENDENT_AMBULATORY_CARE_PROVIDER_SITE_OTHER): Payer: Commercial Managed Care - HMO | Admitting: Physician Assistant

## 2014-01-23 VITALS — BP 140/98 | HR 93 | Ht 65.0 in | Wt 185.0 lb

## 2014-01-23 DIAGNOSIS — I4891 Unspecified atrial fibrillation: Secondary | ICD-10-CM

## 2014-01-23 DIAGNOSIS — I2510 Atherosclerotic heart disease of native coronary artery without angina pectoris: Secondary | ICD-10-CM

## 2014-01-23 DIAGNOSIS — E785 Hyperlipidemia, unspecified: Secondary | ICD-10-CM

## 2014-01-23 DIAGNOSIS — Z01818 Encounter for other preprocedural examination: Secondary | ICD-10-CM | POA: Insufficient documentation

## 2014-01-23 DIAGNOSIS — I10 Essential (primary) hypertension: Secondary | ICD-10-CM

## 2014-01-23 NOTE — Assessment & Plan Note (Signed)
Maintaining normal sinus rhythm. Not on Eliquis because of recent vomiting of blood after taking it.

## 2014-01-23 NOTE — Assessment & Plan Note (Signed)
We don't know the extent of this patient's CAD. He does have a new LV dysfunction on 2-D echo EF 45-50%. He did have the same wall motion abnormality on stress Myoview back in January. The patient has no symptoms of angina or heart failure. Continue medical therapy at this time. Follow-up with Dr. Harrington Challenger.

## 2014-01-23 NOTE — Assessment & Plan Note (Signed)
Patient is here today for preoperative clearance before having 7 teeth pulled. He is a very difficult patient with multiple medical problems including recent possible CVA treated with Eliquis which was stopped because of vomiting blood. The patient also has history of subdural hematoma on MRI in 2013. He has history of CAD which we do not know the details of. Most recent 2-D echo shows new mild LV dysfunction EF 45-50% with akinesis of the basal mid inferior myocardium. He also had a fixed defect in the basal inferior and inferolateral walls that might represent an old infarct on Myoview in January 2015. He was not gated so ejection fraction was not estimated. Prior ejection fraction on echo in 2014 EF was 60-65%. The patient has chronic sharp shooting chest pains but no chest tightness or pressure. He has no evidence of heart failure on exam today I discussed this patient in detail with Dr. Dola Argyle who feels he is at increased risk but could proceed with having his teeth pulled. Follow-up with Dr. Harrington Challenger in 2-3 months.

## 2014-01-23 NOTE — Assessment & Plan Note (Signed)
BP stable.

## 2014-01-23 NOTE — Patient Instructions (Signed)
Your physician recommends that you continue on your current medications as directed. Please refer to the Current Medication list given to you today.   Your physician recommends that you schedule a follow-up appointment in:  WITH  DR ROSS IN 3 MONTHS

## 2014-01-23 NOTE — Progress Notes (Signed)
HPI: This is a 63 year old male patient of Dr. Dorris Carnes who recently had a hospitalization with questionable CVA presenting with right-sided weakness. CHADS-Vasc=3(HTN,DM,CAD) His aspirin was stopped and he was started on Eliquis. After one day of Eliquis he developed vomiting with bright red blood and black stools. He went back to the emergency room where his Hemoccult was negative and hemoglobin was stable. They talked to GI who recommended stopping Eliquis and resuming aspirin 81 mg once daily. 2-D echo in the hospital on 01/04/14 showed new LV dysfunction. There is moderate LVH, EF 45-50% with akinesis of the basal mid inferior myocardium. There is grade 1 diastolic dysfunction. Left atrium was moderately dilated. Carotid Dopplers were okay. Last 2-D echo 10/2012 showed normal LV function EF 60-65%. Stress Myoview in January 2015 showed a fixed defect in the basal inferior and inferolateral walls that might represent old infarct or diaphragmatic attenuation. It was not gated so no ejection fraction was estimated.  Patient last saw Dr. Harrington Challenger in 01/2013. He has a reported history of CAD we have no records, atrial fibrillation, hypertension, diabetes mellitus, COPD with ongoing tobacco abuse, history of subdural or subarachnoid hematoma(on MRI 2013), RA, and schizophrenic disorder.  Patient comes in today wanting clearance to have 7 teeth pulled. He continues to have sharp shooting chest pains at times but denies any chest tightness, pressure, dyspnea at rest, dizziness or presyncope. He continues to have right arm weakness and is unable to raise it over his head. He has had no trouble with heart failure.  Allergies  Allergen Reactions  . Immune Globulins Other (See Comments)    Acute renal failure  . Ciprofloxacin Swelling  . Flagyl [Metronidazole] Swelling  . Lisinopril Diarrhea  . Sulfa Antibiotics Other (See Comments)    blisters  . Betamethasone Dipropionate     Unknown  .  Bupropion Hcl     Unknown  . Clobetasol     Unknown  . Codeine     Unknown  . Escitalopram Oxalate     Unknown  . Fluoxetine Hcl     Unknown  . Fluticasone-Salmeterol     Unknown  . Furosemide     Unknown  . Paroxetine     Unknown  . Penicillins     Unknown  . Tacrolimus     Unknown  . Tetanus Toxoid     Unknown  . Tuberculin Purified Protein Derivative     Unknown     Current Outpatient Prescriptions  Medication Sig Dispense Refill  . albuterol-ipratropium (COMBIVENT) 18-103 MCG/ACT inhaler Inhale 1 puff into the lungs daily. 14.7 g 5  . ALPRAZolam (XANAX) 1 MG tablet TAKE 1 TABLET BY MOUTH DAILY 90 tablet 0  . amiodarone (PACERONE) 200 MG tablet Take 1 tablet (200 mg total) by mouth daily before breakfast. 90 tablet 1  . apixaban (ELIQUIS) 5 MG TABS tablet Take 1 tablet (5 mg total) by mouth 2 (two) times daily. 60 tablet 6  . atorvastatin (LIPITOR) 40 MG tablet Take 1 tablet (40 mg total) by mouth daily. 90 tablet 1  . divalproex (DEPAKOTE ER) 500 MG 24 hr tablet Take 1 tablet (500 mg total) by mouth daily. 60 tablet 2  . esomeprazole (NEXIUM) 40 MG capsule Take 40 mg by mouth daily at 12 noon.    . levETIRAcetam (KEPPRA) 500 MG tablet TAKE 1 TABLET BY MOUTH TWICE DAILY 60 tablet 7  . Multiple Vitamin (MULTIVITAMIN WITH MINERALS) TABS tablet Take 1 tablet  by mouth daily. 90 tablet 3  . ondansetron (ZOFRAN ODT) 4 MG disintegrating tablet 4mg  ODT q4 hours prn nausea/vomit 4 tablet 0  . oxybutynin (DITROPAN) 5 MG tablet TAKE 1 TABLET BY MOUTH THREE TIMES DAILY 270 tablet 2  . potassium chloride SA (K-DUR,KLOR-CON) 20 MEQ tablet TAKE 1 TABLET BY MOUTH EVERY DAY 90 tablet 0  . rOPINIRole (REQUIP) 2 MG tablet Take 2 mg by mouth 3 (three) times daily.    . [DISCONTINUED] oxybutynin (DITROPAN) 5 MG tablet Take 1 tablet (5 mg total) by mouth 3 (three) times daily. 90 tablet 3   No current facility-administered medications for this visit.    Past Medical History  Diagnosis  Date  . Rheumatoid arthritis(714.0)   . Obesity   . Major depression     with acute psychotic break in 06/2010  . Hypertension   . Hyperlipidemia   . Diverticulosis of colon   . COPD (chronic obstructive pulmonary disease)   . Anxiety   . GERD (gastroesophageal reflux disease)   . Vertigo   . Fibromyalgia   . Dermatomyositis   . Myocardial infarct     mulitple (1999, 2000, 2004)  . Raynaud's disease   . Narcotic dependence   . Peripheral neuropathy   . Internal hemorrhoids   . Ischemic heart disease   . Hiatal hernia   . Gastritis   . Diverticulitis   . Hx of adenomatous colonic polyps   . Nephrolithiasis   . Anemia   . Esophageal stricture   . Esophageal dysmotility   . Dermatomyositis   . Urge incontinence   . Otosclerosis   . Bipolar 1 disorder   . OCD (obsessive compulsive disorder)   . Sarcoidosis   . Paroxysmal a-fib   . Dysrhythmia     "irregular" (11/15/2012)  . Type II diabetes mellitus   . Seizures   . Headache(784.0)     "severe; get them often" (11/15/2012)  . Subarachnoid hemorrhage 01/2012    with subdural  hematoma.   . Atrial fibrillation 01/2012    with RVR  . Conversion disorder 06/2010  . History of narcotic addiction     Past Surgical History  Procedure Laterality Date  . Knee arthroscopy w/ meniscal repair Left 2009  . Lumbar disc surgery    . Squamous papilloma   2010    removed by Dr. Constance Holster ENT, noted on tongue  . Esophagogastroduodenoscopy N/A 09/27/2012    Procedure: ESOPHAGOGASTRODUODENOSCOPY (EGD);  Surgeon: Lafayette Dragon, MD;  Location: [facility];  Service: Endoscopy;  Laterality: N/A;  . Colonoscopy N/A 09/27/2012    Procedure: COLONOSCOPY;  Surgeon: Lafayette Dragon, MD;  Location: [facility];  Service: Endoscopy;  Laterality: N/A;  . Cataract extraction w/ intraocular lens implant Left   . Lymph node dissection Right 1970's    "neck; dr thought I had Hodgkins; turned out to be sarcoidosis" (11/15/2012)  . Tonsillectomy    .  Carpal tunnel release Bilateral   . Cardiac catheterization    . Back surgery      Family History  Problem Relation Age of Onset  . Alcohol abuse Mother   . Depression Mother   . Heart disease Mother   . Diabetes Mother   . Stroke Mother   . Diabetes Other     1/2 brother  . Hepatitis Brother     History   Social History  . Marital Status: Divorced    Spouse Name: N/A    Number of Children:  N/A  . Years of Education: N/A   Occupational History  . disabled    Social History Main Topics  . Smoking status: Current Every Day Smoker -- 0.50 packs/day for 30 years    Types: Cigarettes  . Smokeless tobacco: Never Used  . Alcohol Use: No     Comment: Alcohol stopped in September of 2014  . Drug Use: No  . Sexual Activity: No   Other Topics Concern  . Not on file   Social History Narrative    ROS: Patient is in a wheelchair, has chronic shakes, has multiple complaints. BP 140/98 mmHg  Pulse 93  Ht 5\' 5"  (1.651 m)  Wt 185 lb (83.915 kg)  BMI 30.79 kg/m2  PHYSICAL EXAM: Well-nournished, in no acute distress. Neck: No JVD, HJR, Bruit, or thyroid enlargement  Lungs: Decreased breath sounds but No tachypnea, clear without wheezing, rales, or rhonchi  Cardiovascular: RRR, PMI not displaced, Normal S1 and S2, no murmurs, gallops, bruit, thrill, or heave.  Abdomen: BS normal. Soft without organomegaly, masses, lesions or tenderness.  Extremities: without cyanosis, clubbing or edema. Good distal pulses bilateral  SKin: Warm, no lesions or rashes   Musculoskeletal: No deformities  Neuro: no focal signs   Wt Readings from Last 3 Encounters:  01/14/14 222 lb (100.699 kg)  01/04/14 262 lb 4.8 oz (118.978 kg)  12/07/13 217 lb (98.431 kg)    Lab Results  Component Value Date   WBC 6.6 01/14/2014   HGB 15.0 01/14/2014   HCT 46.4 01/14/2014   PLT 250 01/14/2014   GLUCOSE 80 01/14/2014   CHOL 125 01/04/2014   TRIG 116 01/04/2014   HDL 45 01/04/2014   LDLDIRECT  77 10/12/2013   LDLCALC 57 01/04/2014   ALT 15 01/14/2014   AST 16 01/14/2014   NA 144 01/14/2014   K 4.2 01/14/2014   CL 105 01/14/2014   CREATININE 1.00 01/14/2014   BUN 11 01/14/2014   CO2 28 01/14/2014   TSH 3.370 01/04/2014   PSA 0.76 05/31/2008   INR 1.08 01/14/2014   HGBA1C 5.8* 01/04/2014    EKG: Normal sinus rhythm at 60 bpm, possible old inferior infarct no acute change  Myocardial perfusion imaging 03/07/13 Overall Impression:  Low risk stress nuclear study with fixed defect in the basal inferior and inferolateral walls that might represent an old infarct or diaphragmatic attenuation. .This was an ungated study.   LV Ejection Fraction: Study not gated.  LV Wall Motion:  NA   [name], H 03/06/2013  2-D echo11/20/15 Study Conclusions  - Left ventricle: The cavity size was normal. There was moderate   concentric hypertrophy. Systolic function was mildly reduced. The   estimated ejection fraction was in the range of 45% to 50%. There   is akinesis of the basal-midinferior myocardium. Doppler   parameters are consistent with abnormal left ventricular   relaxation (grade 1 diastolic dysfunction). - Mitral valve: Calcified annulus. - Left atrium: The atrium was moderately dilated. - Right ventricle: The cavity size was mildly dilated. Wall   thickness was normal. - Right atrium: The atrium was mildly dilated. - Pulmonary arteries: Systolic pressure was moderately increased.   PA peak pressure: 60 mm Hg (S).  Impressions:  - When compared to prior, EF is reduced and inferior wall motion   abnormality appears new.

## 2014-01-24 ENCOUNTER — Telehealth: Payer: Self-pay | Admitting: Internal Medicine

## 2014-01-24 ENCOUNTER — Telehealth: Payer: Self-pay | Admitting: Physician Assistant

## 2014-01-24 NOTE — Telephone Encounter (Signed)
New Message  Pt was seen by Estella Husk yesterday 12/9, and was told that a clearance was going to be faxed yesterday for oral surgery.  He contacted the dental office, and they had said they arrived no fax from [place].  Pt requests to speak with a nurse about the clearance.  Please call back and discuss.

## 2014-01-24 NOTE — Telephone Encounter (Signed)
Clearance For Dental Procedure Faxed to [facility] Office @ [phone]  # [phone]

## 2014-01-24 NOTE — Telephone Encounter (Signed)
Printed yesterday's ov note from Estella Husk, and phone note detailing the oral surgeon's name and fax number and provided to medical records who will fax this morning.  Called patient to inform. Advised him to call the dental office to confirm it has been received.

## 2014-01-28 ENCOUNTER — Telehealth: Payer: Self-pay | Admitting: Sports Medicine

## 2014-01-29 NOTE — Telephone Encounter (Signed)
Sent in by Dr. Raoul Pitch

## 2014-01-29 NOTE — Telephone Encounter (Signed)
Pt called. He is completely out of requip and needs it refilled.

## 2014-01-30 ENCOUNTER — Telehealth: Payer: Self-pay | Admitting: Family Medicine

## 2014-01-30 NOTE — Telephone Encounter (Signed)
Spoke with patient and he will contact his cardiologist.

## 2014-01-30 NOTE — Telephone Encounter (Addendum)
A review of records suggest the patient was on eliquis and this was stopped recently, secondary to GI bleed. This question should be sent to his cardiologist/neurologist, as I am uncertain as to the outcome of their discussions surrounding his anticoagulation. Thanks.

## 2014-01-30 NOTE — Telephone Encounter (Signed)
Pt called because his home health nurse was checking his med list and seen that he is not taking a blood thinner. So the question he has is should he be on a blood thinner and if so can we call this in. jw

## 2014-02-11 ENCOUNTER — Telehealth: Payer: Self-pay | Admitting: Family Medicine

## 2014-02-11 DIAGNOSIS — H9193 Unspecified hearing loss, bilateral: Secondary | ICD-10-CM

## 2014-02-11 NOTE — Telephone Encounter (Signed)
Requesting a referral to ENT, having problems hearing out of both ears. Pt wants to go to [facility] with Dr. Constance Holster.

## 2014-02-18 ENCOUNTER — Telehealth: Payer: Self-pay | Admitting: Family Medicine

## 2014-02-18 NOTE — Telephone Encounter (Signed)
Pt says [facility] doesn't have his referral  Please call pt when this is done His appt is Jan 19 with Dr Constance Holster

## 2014-02-19 ENCOUNTER — Telehealth: Payer: Self-pay | Admitting: Family Medicine

## 2014-02-19 NOTE — Telephone Encounter (Signed)
Pt called and said that [facility] still has not received referral from [place]. We faxed it on 02/12/14. Pt has an appointment on 03/05/14. Can we re-fax this and check that they received the fax for the patient. jw

## 2014-02-21 NOTE — Telephone Encounter (Signed)
Received fax request and patient has been aware of appoiment

## 2014-03-07 ENCOUNTER — Other Ambulatory Visit: Payer: Self-pay | Admitting: Sports Medicine

## 2014-03-07 ENCOUNTER — Other Ambulatory Visit: Payer: Self-pay | Admitting: Family Medicine

## 2014-03-07 NOTE — Telephone Encounter (Signed)
Pt called and said that his pharmacy is faxing refill request over to [place]. He would like them filled today since the weather is going to be bad tomorrow. jw

## 2014-03-08 ENCOUNTER — Telehealth: Payer: Self-pay | Admitting: Family Medicine

## 2014-03-08 NOTE — Telephone Encounter (Signed)
Emergency Line / After Hours Call  Pt called emergency line because he is not able to afford his nexium. Gave phone number for physician to call to speak with [facility] pharmacy person [phone]). He stated this is urgent and he needs it ASAP. Pt pressured in his speech, listing off his medical problems and history to me. Advised I would attempt to call for him.  Called and spoke with [employer] rep who processed request and stated he can now receive esomeprazole without prior auth. Letter to be sent to patient and his pharmacy.   Chrisandra Netters, MD Family Medicine PGY-3

## 2014-03-11 ENCOUNTER — Telehealth: Payer: Self-pay | Admitting: Family Medicine

## 2014-03-11 NOTE — Telephone Encounter (Signed)
Needs referral to diabetic shoes thru [employer].

## 2014-03-12 NOTE — Telephone Encounter (Addendum)
Received a fax from [facility] stating that Esomeprazole 40 mg 90/90 is available to patient without authorization. Pt has to pay for copay at contracted amount.  [facility] pharmacy aware.  Derl Barrow, RN

## 2014-03-13 NOTE — Telephone Encounter (Signed)
Covering for Dr Raoul Pitch today.  I am guessing he needed a prescription? I have placed one up front that he can pick up and take to [facility]. If however he needed a referral to podiatry, let me know and I can place this.  Hilton Sinclair, MD

## 2014-03-21 ENCOUNTER — Other Ambulatory Visit: Payer: Self-pay | Admitting: Family Medicine

## 2014-03-21 NOTE — Telephone Encounter (Signed)
Pt called and needs a prescription for his diabetic shoes now that he is with [place] [facility]. jw

## 2014-03-22 NOTE — Telephone Encounter (Signed)
Please call the patient, he will need to come in for a diabetic foot exam and paperwork/form for his diabetic shoes with him. Thank you

## 2014-03-22 NOTE — Telephone Encounter (Signed)
Spoke with patient and informed him of below. Appointment made for Tuesday 3/8 @ 2:30pm

## 2014-03-25 ENCOUNTER — Encounter: Payer: Medicare HMO | Admitting: Neurology

## 2014-03-29 ENCOUNTER — Emergency Department (HOSPITAL_COMMUNITY): Payer: Commercial Managed Care - HMO

## 2014-03-29 ENCOUNTER — Observation Stay (HOSPITAL_COMMUNITY)
Admission: EM | Admit: 2014-03-29 | Discharge: 2014-03-29 | Disposition: A | Discharge status: Home / Self Care | Payer: Commercial Managed Care - HMO | Attending: Family Medicine | Admitting: Family Medicine

## 2014-03-29 ENCOUNTER — Encounter (HOSPITAL_COMMUNITY): Payer: Self-pay | Admitting: Emergency Medicine

## 2014-03-29 DIAGNOSIS — E785 Hyperlipidemia, unspecified: Secondary | ICD-10-CM | POA: Insufficient documentation

## 2014-03-29 DIAGNOSIS — R0789 Other chest pain: Secondary | ICD-10-CM | POA: Insufficient documentation

## 2014-03-29 DIAGNOSIS — F17210 Nicotine dependence, cigarettes, uncomplicated: Secondary | ICD-10-CM | POA: Diagnosis not present

## 2014-03-29 DIAGNOSIS — I480 Paroxysmal atrial fibrillation: Secondary | ICD-10-CM | POA: Insufficient documentation

## 2014-03-29 DIAGNOSIS — M797 Fibromyalgia: Secondary | ICD-10-CM | POA: Insufficient documentation

## 2014-03-29 DIAGNOSIS — R079 Chest pain, unspecified: Secondary | ICD-10-CM | POA: Diagnosis not present

## 2014-03-29 DIAGNOSIS — R072 Precordial pain: Secondary | ICD-10-CM

## 2014-03-29 DIAGNOSIS — R9439 Abnormal result of other cardiovascular function study: Secondary | ICD-10-CM | POA: Diagnosis not present

## 2014-03-29 DIAGNOSIS — I259 Chronic ischemic heart disease, unspecified: Secondary | ICD-10-CM | POA: Insufficient documentation

## 2014-03-29 DIAGNOSIS — I10 Essential (primary) hypertension: Secondary | ICD-10-CM | POA: Diagnosis not present

## 2014-03-29 DIAGNOSIS — K219 Gastro-esophageal reflux disease without esophagitis: Secondary | ICD-10-CM | POA: Diagnosis not present

## 2014-03-29 DIAGNOSIS — G20 Parkinson's disease: Secondary | ICD-10-CM | POA: Insufficient documentation

## 2014-03-29 DIAGNOSIS — F1027 Alcohol dependence with alcohol-induced persisting dementia: Secondary | ICD-10-CM | POA: Insufficient documentation

## 2014-03-29 DIAGNOSIS — I252 Old myocardial infarction: Secondary | ICD-10-CM | POA: Insufficient documentation

## 2014-03-29 DIAGNOSIS — I2510 Atherosclerotic heart disease of native coronary artery without angina pectoris: Secondary | ICD-10-CM | POA: Diagnosis not present

## 2014-03-29 DIAGNOSIS — I2584 Coronary atherosclerosis due to calcified coronary lesion: Secondary | ICD-10-CM | POA: Diagnosis not present

## 2014-03-29 DIAGNOSIS — E119 Type 2 diabetes mellitus without complications: Secondary | ICD-10-CM | POA: Diagnosis not present

## 2014-03-29 DIAGNOSIS — J441 Chronic obstructive pulmonary disease with (acute) exacerbation: Secondary | ICD-10-CM | POA: Diagnosis not present

## 2014-03-29 DIAGNOSIS — E669 Obesity, unspecified: Secondary | ICD-10-CM | POA: Diagnosis not present

## 2014-03-29 DIAGNOSIS — M069 Rheumatoid arthritis, unspecified: Secondary | ICD-10-CM | POA: Diagnosis not present

## 2014-03-29 DIAGNOSIS — J449 Chronic obstructive pulmonary disease, unspecified: Secondary | ICD-10-CM | POA: Diagnosis not present

## 2014-03-29 DIAGNOSIS — F419 Anxiety disorder, unspecified: Secondary | ICD-10-CM | POA: Diagnosis not present

## 2014-03-29 LAB — CBC
HCT: 46.4 % (ref 39.0–52.0)
Hemoglobin: 15.1 g/dL (ref 13.0–17.0)
MCH: 29.7 pg (ref 26.0–34.0)
MCHC: 32.5 g/dL (ref 30.0–36.0)
MCV: 91.3 fL (ref 78.0–100.0)
Platelets: 240 10*3/uL (ref 150–400)
RBC: 5.08 MIL/uL (ref 4.22–5.81)
RDW: 15 % (ref 11.5–15.5)
WBC: 8.1 10*3/uL (ref 4.0–10.5)

## 2014-03-29 LAB — BASIC METABOLIC PANEL
ANION GAP: 10 (ref 5–15)
BUN: 9 mg/dL (ref 6–23)
CO2: 27 mmol/L (ref 19–32)
CREATININE: 1.12 mg/dL (ref 0.50–1.35)
Calcium: 9.3 mg/dL (ref 8.4–10.5)
Chloride: 106 mmol/L (ref 96–112)
GFR calc Af Amer: 78 mL/min — ABNORMAL LOW (ref >90)
GFR calc non Af Amer: 67 mL/min — ABNORMAL LOW (ref >90)
Glucose, Bld: 87 mg/dL (ref 70–99)
POTASSIUM: 3.9 mmol/L (ref 3.5–5.1)
Sodium: 143 mmol/L (ref 135–145)

## 2014-03-29 LAB — GLUCOSE, CAPILLARY
GLUCOSE-CAPILLARY: 75 mg/dL (ref 70–99)
Glucose-Capillary: 80 mg/dL (ref 70–99)
Glucose-Capillary: 74 mg/dL (ref 70–99)

## 2014-03-29 LAB — I-STAT TROPONIN, ED: TROPONIN I, POC: 0 ng/mL (ref 0.00–0.08)

## 2014-03-29 LAB — BRAIN NATRIURETIC PEPTIDE: B Natriuretic Peptide: 13.8 pg/mL (ref 0.0–100.0)

## 2014-03-29 LAB — TROPONIN I
Troponin I: <0.03 ng/mL (ref <0.031)
Troponin I: <0.03 ng/mL (ref <0.031)

## 2014-03-29 LAB — MRSA PCR SCREENING: MRSA by PCR: NEGATIVE

## 2014-03-29 MED ORDER — HEPARIN SODIUM (PORCINE) 5000 UNIT/ML IJ SOLN
5000.0000 [IU] | Freq: Three times a day (TID) | INTRAMUSCULAR | Status: DC
  Administered 2014-03-29 (×2): 5000 [IU] via SUBCUTANEOUS
  Filled 2014-03-29: qty 1

## 2014-03-29 MED ORDER — ATORVASTATIN CALCIUM 40 MG PO TABS
40.0000 mg | ORAL_TABLET | Freq: Every day | ORAL | Status: DC
  Administered 2014-03-29: 40 mg via ORAL
  Filled 2014-03-29: qty 1

## 2014-03-29 MED ORDER — TRAMADOL HCL 50 MG PO TABS
50.0000 mg | ORAL_TABLET | Freq: Four times a day (QID) | ORAL | Status: DC
  Administered 2014-03-29 (×2): 50 mg via ORAL
  Filled 2014-03-29 (×2): qty 1

## 2014-03-29 MED ORDER — ACETAMINOPHEN 325 MG PO TABS: 650.0000 mg | ORAL_TABLET | ORAL | Status: DC | PRN

## 2014-03-29 MED ORDER — PANTOPRAZOLE SODIUM 40 MG PO TBEC
40.0000 mg | DELAYED_RELEASE_TABLET | Freq: Every day | ORAL | Status: DC
  Administered 2014-03-29: 40 mg via ORAL
  Filled 2014-03-29: qty 1

## 2014-03-29 MED ORDER — AMIODARONE HCL 200 MG PO TABS
200.0000 mg | ORAL_TABLET | Freq: Every day | ORAL | Status: DC
  Administered 2014-03-29: 200 mg via ORAL
  Filled 2014-03-29: qty 1

## 2014-03-29 MED ORDER — ALPRAZOLAM 0.5 MG PO TABS
1.0000 mg | ORAL_TABLET | Freq: Every day | ORAL | Status: DC
  Administered 2014-03-29: 1 mg via ORAL
  Filled 2014-03-29: qty 2

## 2014-03-29 MED ORDER — INSULIN ASPART 100 UNIT/ML ~~LOC~~ SOLN: 0.0000 [IU] | Freq: Three times a day (TID) | SUBCUTANEOUS | Status: DC

## 2014-03-29 MED ORDER — NITROGLYCERIN 0.4 MG SL SUBL
0.4000 mg | SUBLINGUAL_TABLET | SUBLINGUAL | Status: DC | PRN
  Administered 2014-03-29: 0.4 mg via SUBLINGUAL
  Filled 2014-03-29: qty 1

## 2014-03-29 MED ORDER — METOPROLOL TARTRATE 25 MG PO TABS
25.0000 mg | ORAL_TABLET | Freq: Two times a day (BID) | ORAL | Status: DC
  Administered 2014-03-29: 25 mg via ORAL
  Filled 2014-03-29: qty 1

## 2014-03-29 MED ORDER — ASPIRIN EC 81 MG PO TBEC
81.0000 mg | DELAYED_RELEASE_TABLET | Freq: Every day | ORAL | Status: DC
  Administered 2014-03-29: 81 mg via ORAL
  Filled 2014-03-29: qty 1

## 2014-03-29 MED ORDER — ONDANSETRON HCL 4 MG/2ML IJ SOLN
4.0000 mg | Freq: Four times a day (QID) | INTRAMUSCULAR | Status: DC | PRN
  Administered 2014-03-29: 4 mg via INTRAVENOUS
  Filled 2014-03-29: qty 2

## 2014-03-29 MED ORDER — DIVALPROEX SODIUM ER 500 MG PO TB24
500.0000 mg | ORAL_TABLET | Freq: Every day | ORAL | Status: DC
  Administered 2014-03-29: 500 mg via ORAL
  Filled 2014-03-29: qty 1

## 2014-03-29 MED ORDER — LEVETIRACETAM 500 MG PO TABS
500.0000 mg | ORAL_TABLET | Freq: Two times a day (BID) | ORAL | Status: DC
  Administered 2014-03-29: 500 mg via ORAL
  Filled 2014-03-29 (×2): qty 1

## 2014-03-29 MED ORDER — MORPHINE SULFATE 2 MG/ML IJ SOLN
2.0000 mg | INTRAMUSCULAR | Status: DC | PRN
  Administered 2014-03-29 (×2): 2 mg via INTRAVENOUS
  Filled 2014-03-29 (×2): qty 1

## 2014-03-29 NOTE — Progress Notes (Signed)
  Echocardiogram 2D Echocardiogram has been performed.  Darlina Sicilian M 03/29/2014, 1:26 PM

## 2014-03-29 NOTE — ED Notes (Addendum)
Pt arrives via EMS from home with c/o chest pain ongoing for two days. Long cardiac hx, including MI and a fib. Centeral chest radiating to L arm. Diarrhea and increased weakness over the last several days. NSR EKG. Increased SHOB with movement. 130/78, 65 HR, 18RR, 97% on RA.

## 2014-03-29 NOTE — H&P (Signed)
[facility] Admission History and Physical Service Pager: [phone]  Patient name: Patrick Holt Medical record number: [id number] Date of birth: [birth year] Age: 64 y.o. Gender: male  Primary Care Provider: Howard Pouch, DO Consultants: Cardiology Code Status: Full (confirmed on admission)  Chief Complaint: Chest pain  Assessment and Plan: Patrick Holt is a 64 y.o. male presenting with intermittent substernal / L-sided chest pain worsening over past 48 hours, with some atypical features. Admitted for CP rule out MI and Cardiology consultation (established outpt). PMH is significant for CAD s/p MI, AFib (no anticoagulation), seizures, HTN, active smoker, DM2 (diet controlled), COPD, dermatomyotisis, and h/o psychotic episodes   # Chest Pain, Rule out MI (in setting of known CAD) - Symptoms with intermittent chest pain with some typical and atypical features x 48 hours with worsening. Significant concern given known CAD s/p reported multiple MIs. Last stress test myoview in 02/2013 with fixed defect. Work-up in ED with trop-poct (neg x 2), EKG sinus tachy 117, no ischemic changes (stable compared to 03/20/14). CXR negative, no focal infiltrates. - Admit to SDU per ACS rule out protocol in patient with active CP, attending Dr. Mingo Amber - telemetry, vitals per protocol, cont pulse oximetry (no O2 req on admit), O2 PRN - s/p ASA 81 x 4 at home - Troponin-I cycle x 3 q 6 hr - Repeat EKG in AM - cont ASA 81mg  daily - Morphine 2mg  IV q 2 hr PRN - Atorvastatin 80mg  daily - Metoprolol 25mg  PO BID >> started since pt was not on BB at home - Zofran PRN - AM BMET, CBC - Ordered ECHO - Consulted Cardiology - *note at time of admission pending Cards consult in AM* - Cardiology has seen pt and plan to monitor with med management, if persistent symptoms would consider GI eval for EGD prior to proceeding with definitive diagnostic cardiac cath. - Question dual anti-plt  therapy as pt currently only on ASA 81mg  only  # GERD - Cont Protonix 40mg  daily  # Anxiety - Continue Alprazolam 1mg  daily  # H/o possible CVA (2015) - Hospitalized and evaluation with potential CVA, determined elevated Mali score and risk factors, switched from ASA to Eliquis, but had subsequent GIB and GI recommended DC Eliquis >> resume ASA 81mg  daily - Taking Keppra for possible seizure history  # H/o parkinson's symptoms - Reportedly taking Depakote-ER 500mg  daily  FEN/GI: NPO + chips / sips meds (consider advancing diet if pain resolves and cleared by Cardiology) Prophylaxis: SQ Heparin  Disposition: Admit to [place] [facility] SDU inpatient for CP r/o MI, continue lab and EKG work-up demonstrating no acute findings of ischemia, Cardiology consulted, plan to proceed with med management and consider further work-up with potential cath if pain persists.  History of Present Illness: Patrick Holt is a 64 y.o. male presenting with chest pain.  Reports symptoms started with Left sided intermittent aching chest pain 8/10, worsening within past 24 hours and experienced episode overnight that woke him up with chest pain described as sharp pain (similar to earlier in day) thought about waiting for it to go away, but was persistent so called EMS taken to ED, took 81mg  ASA x 4 at home, did not receive any NTG prior to arrival. Additionally reports associated with sharp shooting pains down Left leg, Left arm, lasting about 30 seconds. Currently in ED now with worsening chest pain described as aching, without exertional component, did not seem to be relieved by NTG or morphine. Additionally with  known history chronic lumbar DJD with significant previous history of radicular pain.  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 Followed at [facility], completed oral chemotherapy instead of IV unable to obtain chronic IV access.  Review Of Systems: Per HPI with the following additions: none Otherwise 12 point review of systems was performed and was unremarkable.  Patient Active Problem List   Diagnosis Date Noted  . Chest pain 03/29/2014  . Preoperative clearance 01/23/2014  . Right sided weakness 01/04/2014  . Weakness   . Type 2 diabetes mellitus without complication   . Essential hypertension, benign   . Atrial fibrillation, unspecified   . Numbness of arm   . Radiculopathy of cervical region   . Polyuria 12/07/2013  . History of oral surgery 12/07/2013  . Erectile dysfunction 12/07/2013  . Diverticulitis 08/01/2013  . Acute diverticulitis 08/01/2013  . Ankle pain, right 06/20/2013  . Hematochezia 06/04/2013  . Pain in joint, lower leg 12/08/2012  . Dehydration 11/09/2012  . Contact dermatitis 11/09/2012  . Hypokalemia 10/03/2012  . Benign neoplasm of colon 09/27/2012  . Stricture and stenosis of esophagus 09/27/2012  . Diverticulitis of colon without hemorrhage 07/26/2012  . Polypharmacy 03/26/2012  . Thought disorder 02/05/202014    Class: Chronic  . 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 Psychotic disorder 02/14/2012  . Seizures 02/10/2012  . Atrial fibrillation 02/05/2012  . Coronary atherosclerosis of native coronary artery 02/05/2012  . High risk social situation 10/15/2011  . Dental attrition, excessive 03/10/2011  . Radicular low back pain 03/09/2011  . History of seizure disorder 01/29/2011  . Irritable bowel syndrome (IBS) 06/24/2010  . Urge incontinence 06/19/2010  . TOBACCO USER 11/23/2008  . DEPRESSION, MAJOR 05/31/2008  . ISCHEMIC HEART DISEASE 05/31/2008  . RAYNAUD'S DISEASE  05/31/2008  . HIATAL HERNIA 05/31/2008  . Rheumatoid arthritis(714.0) 05/31/2008  . FIBROMYALGIA 05/31/2008  . Generalized pain 05/31/2008  . DIABETES MELLITUS, TYPE II 01/26/2008  . HYPERLIPIDEMIA 01/26/2008  . ANXIETY, CHRONIC 01/26/2008  . HYPERTENSION 01/26/2008  . HEMORRHOIDS, INTERNAL 01/26/2008  . COPD 01/26/2008  . ESOPHAGEAL MOTILITY DISORDER 01/26/2008  . GERD 01/26/2008  . DIVERTICULOSIS OF COLON 01/26/2008  . Dermatomyositis 01/26/2008   Past Medical History: Past Medical History  Diagnosis Date  . Rheumatoid arthritis(714.0)   . Obesity   . Major depression     with acute psychotic break in 06/2010  . Hypertension   . Hyperlipidemia   . Diverticulosis of colon   . COPD (chronic obstructive pulmonary disease)   . Anxiety   . GERD (gastroesophageal reflux disease)   . Vertigo   . Fibromyalgia   . Dermatomyositis   . Myocardial infarct     mulitple (1999, 2000, 2004)  . Raynaud's disease   . Narcotic dependence   . Peripheral neuropathy   . Internal hemorrhoids   . Ischemic heart disease   . Hiatal hernia   . Gastritis   . Diverticulitis   . Hx of adenomatous colonic polyps   . Nephrolithiasis   . Anemia   . Esophageal stricture   . Esophageal dysmotility   . Dermatomyositis   . Urge  incontinence   . Otosclerosis   . Bipolar 1 disorder   . OCD (obsessive compulsive disorder)   . Sarcoidosis   . Paroxysmal a-fib   . Dysrhythmia     "irregular" (11/15/2012)  . Type II diabetes mellitus   . Seizures   . Headache(784.0)     "severe; get them often" (11/15/2012)  . Subarachnoid hemorrhage 01/2012    with subdural  hematoma.   . Atrial fibrillation 01/2012    with RVR  . Conversion disorder 06/2010  . History of narcotic addiction    Past Surgical History: Past Surgical History  Procedure Laterality Date  . Knee arthroscopy w/ meniscal repair Left 2009  . Lumbar disc surgery    . Squamous papilloma   2010    removed by Dr. Constance Holster ENT, noted on  tongue  . Esophagogastroduodenoscopy N/A 09/27/2012    Procedure: ESOPHAGOGASTRODUODENOSCOPY (EGD);  Surgeon: Lafayette Dragon, MD;  Location: [facility];  Service: Endoscopy;  Laterality: N/A;  . Colonoscopy N/A 09/27/2012    Procedure: COLONOSCOPY;  Surgeon: Lafayette Dragon, MD;  Location: [facility];  Service: Endoscopy;  Laterality: N/A;  . Cataract extraction w/ intraocular lens implant Left   . Lymph node dissection Right 1970's    "neck; dr thought I had Hodgkins; turned out to be sarcoidosis" (11/15/2012)  . Tonsillectomy    . Carpal tunnel release Bilateral   . Cardiac catheterization    . Back surgery     Social History: History  Substance Use Topics  . Smoking status: Current Every Day Smoker -- 0.50 packs/day for 30 years    Types: Cigarettes  . Smokeless tobacco: Never Used     Comment: 3 cigs a day  . Alcohol Use: No     Comment: Alcohol stopped in September of 2014   Additional social history: none  Please also refer to relevant sections of EMR.  Family History: Family History  Problem Relation Age of Onset  . Alcohol abuse Mother   . Depression Mother   . Heart disease Mother   . Diabetes Mother   . Stroke Mother   . Diabetes Other     1/2 brother  . Hepatitis Brother    Allergies and Medications: Allergies  Allergen Reactions  . Immune Globulins Other (See Comments)    Acute renal failure  . Ciprofloxacin Swelling  . Flagyl [Metronidazole] Swelling  . Lisinopril Diarrhea  . Sulfa Antibiotics Other (See Comments)    blisters  . Betamethasone Dipropionate     Unknown  . Bupropion Hcl     Unknown  . Clobetasol     Unknown  . Codeine     Unknown  . Escitalopram Oxalate     Unknown  . Fluoxetine Hcl     Unknown  . Fluticasone-Salmeterol     Unknown  . Furosemide     Unknown  . Paroxetine     Unknown  . Penicillins     Unknown  . Tacrolimus     Unknown  . Tetanus Toxoid     Unknown  . Tuberculin Purified Protein Derivative     Unknown    No current facility-administered medications on file prior to encounter.   Current Outpatient Prescriptions on File Prior to Encounter  Medication Sig Dispense Refill  . albuterol-ipratropium (COMBIVENT) 18-103 MCG/ACT inhaler Inhale 1 puff into the lungs daily. 14.7 g 5  . ALPRAZolam (XANAX) 1 MG tablet TAKE 1 TABLET BY MOUTH DAILY 90 tablet 0  . amiodarone (PACERONE)  200 MG tablet TAKE 1 TABLET BY MOUTH EVERY DAY WITH BREAKFAST 90 tablet 3  . atorvastatin (LIPITOR) 40 MG tablet Take 1 tablet (40 mg total) by mouth daily. 90 tablet 1  . divalproex (DEPAKOTE ER) 500 MG 24 hr tablet Take 1 tablet (500 mg total) by mouth daily. 60 tablet 2  . levETIRAcetam (KEPPRA) 500 MG tablet TAKE 1 TABLET BY MOUTH TWICE DAILY 60 tablet 7  . Multiple Vitamin (MULTIVITAMIN WITH MINERALS) TABS tablet Take 1 tablet by mouth daily. 90 tablet 3  . NEXIUM 40 MG capsule TAKE ONE CAPSULE BY MOUTH EVERY MORNING WITH BREAKFAST 90 capsule 3  . oxybutynin (DITROPAN) 5 MG tablet TAKE 1 TABLET BY MOUTH THREE TIMES DAILY 270 tablet 2  . potassium chloride SA (K-DUR,KLOR-CON) 20 MEQ tablet TAKE 1 TABLET BY MOUTH EVERY DAY 90 tablet 0  . rOPINIRole (REQUIP) 2 MG tablet TAKE 1 TABLET BY MOUTH THREE TIMES DAILY 90 tablet 3  . esomeprazole (NEXIUM) 40 MG capsule Take 40 mg by mouth daily at 12 noon.    . ondansetron (ZOFRAN ODT) 4 MG disintegrating tablet 4mg  ODT q4 hours prn nausea/vomit (Patient not taking: Reported on 03/29/2014) 4 tablet 0    Objective: BP 142/90 mmHg  Pulse 66  Temp(Src) 98.5 F (36.9 C) (Oral)  Resp 16  Ht 5\' 5"  (1.651 m)  Wt 212 lb (96.163 kg)  BMI 35.28 kg/m2  SpO2 96% Exam: General: well-appearing with intermittent painful episodes with grimacing but resumes talking within 30 seconds of pain, cooperative, NAD HEENT: NCAT, PERRL, EOMI, oropharynx clear, mild dry mucus mem and tongue Cardiovascular: RRR, no murmurs heard,  Respiratory: Scattered wheezing (lower > upper fields), without focal  crackles. Decent air movement. Speaks full sentences without resp distress. Abdomen: soft, mild generalized tenderness lower abd non-focal, non-distended, +active BS Extremities: no edema, non-tender, moves all ext Skin: warm, dry Neuro: awake, alert, oriented x 3, grossly intact Psych: good mood, congruent affect, normal speech and thought content, good insight  Labs and Imaging: CBC BMET   Recent Labs Lab 03/29/14 0230  WBC 8.1  HGB 15.1  HCT 46.4  PLT 240    Recent Labs Lab 03/29/14 0230  NA 143  K 3.9  CL 106  CO2 27  BUN 9  CREATININE 1.12  GLUCOSE 87  CALCIUM 9.3     Trop-poc - neg Troponin-I - neg x 2  CXR Mild cardiomegaly, bibasilar atelectasis. No focal infiltrate or edema  EKG - NSR, no acute ST-T changes  Nobie Putnam, DO 03/29/2014, 4:13 AM PGY-2, [place] [facility] pager: [phone], text pages welcome

## 2014-03-29 NOTE — Progress Notes (Signed)
Discharge instructions given to pt, pt verbalized understanding. Iv access discontinued. Pt's transported via w/c with belongings. Cab called for pt. Voucher handed to NT. Will monitor

## 2014-03-29 NOTE — Discharge Summary (Signed)
[facility] Discharge Summary  Patient name: Patrick Holt Medical record number: [id number] Date of birth: 1950/07/31 Age: 64 y.o. Gender: male Date of Admission: 03/29/2014  Date of Discharge: 03/29/2014 Admitting Physician: Alveda Reasons, MD  Primary Care Provider: Howard Pouch, DO Consultants: Cardiology  Indication for Hospitalization: Chest Pain  Discharge Diagnoses/Problem List:  CAD s/p MI Atrial Fibrillation - not on chronic anticoagulation Seizures Hypertension Active Smoker (uses 2-4 cigarettes daily) Type II DM COPD Dermatomyositis History of Psychotic Episodes  Disposition: Home  Discharge Condition: Stable  Discharge Exam:  General: NAD, AAOx3 HEENT: NCAT, PERRL, EOMI, MMM, O/P Clear Cardiovascular: RRR, no murmurs heard,  Respiratory: Scattered wheezing (lower > upper fields), without focal crackles. Decent air movement. Speaks full sentences without resp distress. Abdomen: S, NT, ND, +BS Extremities: no edema, non-tender, moves all ext Skin: warm, dry Neuro: awake, alert, oriented x 3, grossly intact Psych: good mood, congruent affect, normal speech and thought content, good insight  Brief Hospital Course:  Patrick Holt is a 64 y.o. male presenting with intermittent substernal / L-sided chest pain worsening over past 48 hours, with some atypical features. Admitted for CP rule out MI and Cardiology consultation (established outpt). PMH is significant for CAD s/p MI, AFib (no anticoagulation), seizures, HTN, active smoker, DM2 (diet controlled), COPD, dermatomyotisis, and h/o psychotic episodes   # Chest Pain, Rule out MI (in setting of known CAD) - Symptoms with intermittent chest pain with some typical and atypical features x 48 hours with worsening. Initially with significant concern given known CAD s/p reported multiple MIs. Last stress test myoview in 02/2013 with fixed defect. Work-up in ED with trop-poct (neg x 2),  EKG sinus tachy 117, no ischemic changes (stable compared to 03/20/14). CXR negative, no focal infiltrates. He was admitted to the stepdown unit for rule out ACS. Cardiology was consulted and were concerned as well, but they felt that given his risk for GI bleed with anticoagulation that he would need an EGD to insure no occult lesion that would bleed if he were to be cathed and had stent placement with requirement for dual antiplatelet therapy. However, we cycled troponins on him that remained negative x 3. He had an echocardiogram done that was read as left ventricular septal and apical hypokinesis with an EF of 50% prior to discharge. He was found to be safe and stable for discharge with continuation of risk reduction medications, and close outpatient follow up. As an outpatient he would benefit from an EGD to rule out occult lesion that could bleed with dual antiplatelet therapy and then cardiac catheterization done electively. Will defer to management of PCP and cariology for ongoing workup.   Other Medical Problems managed during this admission:  GERD -  Continued on protonix 40mg  daily.  Anxiety -  Continued on Alprazolam 1mg  daily.  Possible CVA in 2015 -  Concern for CVA during hospitalization last year. [place]i score and risk factors switched from ASA to Eliquis, however had a GIB and GI recommended discontinuation of Eliquis. He is currently on 81mg  daily of ASA. Continued Keppra for history of possible seizures as well as Depakote ER 500mg  daily.    Issues for Follow Up:  1. Follow up improvement in angina 2. Follow up need for EGD and potential Diagnostic catheterization at later date.  3. Follow up mental status and ongoing psych issues.   Significant Procedures: None  Significant Labs and Imaging:   Recent Labs Lab 03/29/14 0230  WBC 8.1  HGB 15.1  HCT 46.4  PLT 240    Recent Labs Lab 03/29/14 0230  NA 143  K 3.9  CL 106  CO2 27  GLUCOSE 87  BUN 9  CREATININE 1.12   CALCIUM 9.3   CXR Mild cardiomegaly, bibasilar atelectasis. No focal infiltrate or edema  EKG - NSR, no acute ST-T changes   Results/Tests Pending at Time of Discharge: None  Discharge Medications:    Medication List    TAKE these medications        albuterol-ipratropium 18-103 MCG/ACT inhaler  Commonly known as:  COMBIVENT  Inhale 1 puff into the lungs daily.     ALPRAZolam 1 MG tablet  Commonly known as:  XANAX  TAKE 1 TABLET BY MOUTH DAILY     amiodarone 200 MG tablet  Commonly known as:  PACERONE  TAKE 1 TABLET BY MOUTH EVERY DAY WITH BREAKFAST     atorvastatin 40 MG tablet  Commonly known as:  LIPITOR  Take 1 tablet (40 mg total) by mouth daily.     divalproex 500 MG 24 hr tablet  Commonly known as:  DEPAKOTE ER  Take 1 tablet (500 mg total) by mouth daily.     esomeprazole 40 MG capsule  Commonly known as:  NEXIUM  Take 40 mg by mouth daily at 12 noon.     NEXIUM 40 MG capsule  Generic drug:  esomeprazole  TAKE ONE CAPSULE BY MOUTH EVERY MORNING WITH BREAKFAST     levETIRAcetam 500 MG tablet  Commonly known as:  KEPPRA  TAKE 1 TABLET BY MOUTH TWICE DAILY     multivitamin with minerals Tabs tablet  Take 1 tablet by mouth daily.     ondansetron 4 MG disintegrating tablet  Commonly known as:  ZOFRAN ODT  4mg  ODT q4 hours prn nausea/vomit     oxybutynin 5 MG tablet  Commonly known as:  DITROPAN  TAKE 1 TABLET BY MOUTH THREE TIMES DAILY     potassium chloride SA 20 MEQ tablet  Commonly known as:  K-DUR,KLOR-CON  TAKE 1 TABLET BY MOUTH EVERY DAY     rOPINIRole 2 MG tablet  Commonly known as:  REQUIP  TAKE 1 TABLET BY MOUTH THREE TIMES DAILY        Discharge Instructions: Please refer to Patient Instructions section of EMR for full details.  Patient was counseled important signs and symptoms that should prompt return to medical care, changes in medications, dietary instructions, activity restrictions, and follow up appointments.   Follow-Up  Appointments: Follow-up Information    Follow up with Kuneff, Renee, DO. Go on 04/23/2014.   Specialty:  Family Medicine   Why:  Hospital Follow Up - 2:30 pm.    Contact information:   [place] [address] [phone]       Aquilla Hacker, MD 04/03/2014, 8:18 PM PGY-1, [place]

## 2014-03-29 NOTE — Consult Note (Signed)
Referring Physician:  Primary Physician: Primary Cardiologist: Reason for Consultation:    HPI:  [name] is a 64 year old male patient of Dr. 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 We are asked to consult regarding CP.   He was hospitalized at the end of last year with questionable CVA presenting with right-sided weakness. CHADS-Vasc=3 (HTN,DM,CAD) His aspirin was stopped and he was started on Eliquis. After one day of Eliquis he developed vomiting with bright red blood and black stools. He went back to the emergency room where his Hemoccult was negative and hemoglobin was stable. They talked to GI who recommended stopping Eliquis and resuming aspirin 81 mg once daily. He did not have any formal GI studies.   2-D echo in the hospital on 01/04/14 showed new LV dysfunction. There is moderate LVH, EF 45-50% with akinesis of the basal mid inferior myocardium. There is grade 1 diastolic dysfunction. Left atrium was moderately dilated. Carotid Dopplers were okay. Last 2-D echo 10/2012 showed normal LV function EF 60-65%. Stress Myoview in January 2015 showed a fixed defect in the basal inferior and inferolateral walls that might represent old infarct or diaphragmatic attenuation. It was not gated so no ejection fraction was estimated.  He reports 3 heart attacks in past - last 2004 - none of which sound like true events but it is hard to tell. Reports heart cath in the 1990s which was ok. Reports CP over past 2 days that feels like a pressure and goes down his left arm and leg. Fairly constant. No relief with NTG or morphine. Asking for dilaudid. Denies exertional symptoms.    Review of Systems:     Cardiac Review of Systems: {Y] = yes [ ]  = no  Chest Pain [  y  ]  Resting  SOB [   ] Exertional SOB  [  ]  Orthopnea [  ]   Pedal Edema [   ]    Palpitations [  ] Syncope  [  ]   Presyncope [   ]  General Review of Systems: [Y] = yes [  ]=no Constitional: recent weight change [  ]; anorexia [  ]; fatigue [  ]; nausea [  ]; night sweats [  ]; fever [  ]; or chills [  ];                                                                     Eyes : blurred vision [  ]; diplopia [   ]; vision changes [  ];  Amaurosis fugax[  ]; Resp: cough [  ];  wheezing[  ];  hemoptysis[  ];  PND [  ];  GI:  gallstones[  ], vomiting[  ];  dysphagia[  ]; melena[  ];  hematochezia [  ]; heartburn[  ];   GU: kidney stones [  ]; hematuria[  ];   dysuria [  ];  nocturia[  ]; incontinence [  ];  Skin: rash, swelling[  ];, hair loss[  ];  peripheral edema[  ];  or itching[  ]; Musculosketetal: myalgias[  ];  joint swelling[  ];  joint erythema[  ];  joint pain[ y ];  back pain[ y ];  Heme/Lymph: bruising[  ];  bleeding[y  ];  anemia[  ];  Neuro: TIA[  ];  headaches[  ];  stroke[  ];  vertigo[  ];  seizures[  ];   paresthesias[  ];  difficulty walking[  ];  Psych:depression[  ]; anxiety[  ];  Endocrine: diabetes[  ];  thyroid dysfunction[  ];  Other:  Past Medical History  Diagnosis Date  . Rheumatoid arthritis(714.0)   . Obesity   . Major depression     with acute psychotic break in 06/2010  . Hypertension   . Hyperlipidemia   . Diverticulosis of colon   . COPD (chronic obstructive pulmonary disease)   . Anxiety   . GERD (gastroesophageal reflux disease)   . Vertigo   . Fibromyalgia   . Dermatomyositis   . Myocardial infarct     mulitple (1999, 2000, 2004)  . Raynaud's disease   . Narcotic dependence   . Peripheral neuropathy   . Internal hemorrhoids   . Ischemic heart disease   . Hiatal hernia   . Gastritis   . Diverticulitis   . Hx of adenomatous colonic polyps   . Nephrolithiasis   . Anemia   . Esophageal stricture   . Esophageal dysmotility   .  Dermatomyositis   . Urge incontinence   . Otosclerosis   . Bipolar 1 disorder   . OCD (obsessive compulsive disorder)   . Sarcoidosis   . Paroxysmal a-fib   . Dysrhythmia     "irregular" (11/15/2012)  . Type II diabetes mellitus   . Seizures   . Headache(784.0)     "severe; get them often" (11/15/2012)  . Subarachnoid hemorrhage 01/2012    with subdural  hematoma.   . Atrial fibrillation 01/2012    with RVR  . Conversion disorder 06/2010  . History of narcotic addiction     Medications Prior to Admission  Medication Sig Dispense Refill  . albuterol-ipratropium (COMBIVENT) 18-103 MCG/ACT inhaler Inhale 1 puff into the lungs daily. 14.7 g 5  . ALPRAZolam (XANAX) 1 MG tablet TAKE 1 TABLET BY MOUTH DAILY 90 tablet 0  . amiodarone (PACERONE) 200 MG tablet TAKE 1 TABLET BY MOUTH EVERY DAY WITH BREAKFAST 90 tablet 3  . atorvastatin (LIPITOR) 40 MG tablet Take 1 tablet (40 mg total) by mouth daily. 90 tablet 1  . divalproex (DEPAKOTE ER) 500 MG 24 hr tablet Take 1 tablet (500 mg total) by mouth daily. 60 tablet 2  . levETIRAcetam (KEPPRA) 500 MG tablet TAKE 1 TABLET BY MOUTH TWICE DAILY 60 tablet 7  . Multiple Vitamin (MULTIVITAMIN WITH MINERALS) TABS tablet Take 1 tablet by mouth daily. 90 tablet 3  . NEXIUM 40 MG capsule TAKE ONE CAPSULE BY MOUTH EVERY MORNING WITH BREAKFAST 90 capsule 3  . oxybutynin (DITROPAN) 5 MG tablet TAKE 1 TABLET BY MOUTH THREE TIMES DAILY 270 tablet 2  . potassium chloride SA (K-DUR,KLOR-CON) 20 MEQ tablet TAKE 1 TABLET BY MOUTH EVERY DAY 90 tablet 0  . rOPINIRole (REQUIP) 2 MG tablet TAKE 1 TABLET BY MOUTH THREE TIMES DAILY 90 tablet 3  . esomeprazole (NEXIUM) 40 MG capsule Take 40 mg by mouth daily at 12 noon.    . ondansetron (ZOFRAN ODT) 4 MG disintegrating  tablet 4mg  ODT q4 hours prn nausea/vomit (Patient not taking: Reported on 03/29/2014) 4 tablet 0     . ALPRAZolam  1 mg Oral Daily  . amiodarone  200 mg Oral Daily  . aspirin EC  81 mg Oral Daily  .  atorvastatin  40 mg Oral Daily  . divalproex  500 mg Oral Daily  . heparin  5,000 Units Subcutaneous 3 times per day  . levETIRAcetam  500 mg Oral BID  . metoprolol tartrate  25 mg Oral BID  . pantoprazole  40 mg Oral Daily    Infusions:    Allergies  Allergen Reactions  . Immune Globulins Other (See Comments)    Acute renal failure  . Ciprofloxacin Swelling  . Flagyl [Metronidazole] Swelling  . Lisinopril Diarrhea  . Sulfa Antibiotics Other (See Comments)    blisters  . Betamethasone Dipropionate     Unknown  . Bupropion Hcl     Unknown  . Clobetasol     Unknown  . Codeine     Unknown  . Escitalopram Oxalate     Unknown  . Fluoxetine Hcl     Unknown  . Fluticasone-Salmeterol     Unknown  . Furosemide     Unknown  . Paroxetine     Unknown  . Penicillins     Unknown  . Tacrolimus     Unknown  . Tetanus Toxoid     Unknown  . Tuberculin Purified Protein Derivative     Unknown    History   Social History  . Marital Status: Divorced    Spouse Name: N/A  . Number of Children: N/A  . Years of Education: N/A   Occupational History  . disabled    Social History Main Topics  . Smoking status: Current Every Day Smoker -- 0.50 packs/day for 30 years    Types: Cigarettes  . Smokeless tobacco: Never Used     Comment: 3 cigs a day  . Alcohol Use: No     Comment: Alcohol stopped in September of 2014  . Drug Use: No  . Sexual Activity: No   Other Topics Concern  . Not on file   Social History Narrative    Family History  Problem Relation Age of Onset  . Alcohol abuse Mother   . Depression Mother   . Heart disease Mother   . Diabetes Mother   . Stroke Mother   . Diabetes Other     1/2 brother  . Hepatitis Brother     PHYSICAL EXAM: Filed Vitals:   03/29/14 0622  BP:   Pulse: 64  Temp:   Resp: 17    No intake or output data in the 24 hours ending 03/29/14 0803  General:  Well appearing. No respiratory difficulty HEENT: normal Neck:  supple. no JVD. Carotids 2+ bilat; no bruits. No lymphadenopathy or thryomegaly appreciated. Cor: PMI nondisplaced. Regular rate & rhythm. No rubs, gallops or murmurs. Lungs: clear Abdomen: soft, nontender, nondistended. No hepatosplenomegaly. No bruits or masses. Good bowel sounds. Extremities: no cyanosis, clubbing, rash, edema Neuro: alert & oriented x 3, cranial nerves grossly intact. moves all 4 extremities w/o difficulty. Affect pleasant.  ECG: NSR 62 inferior qs   Results for orders placed or performed during the hospital encounter of 03/29/14 (from the past 24 hour(s))  CBC     Status: None   Collection Time: 03/29/14  2:30 AM  Result Value Ref Range   WBC 8.1 4.0 - 10.5 K/uL  RBC 5.08 4.22 - 5.81 MIL/uL   Hemoglobin 15.1 13.0 - 17.0 g/dL   HCT 46.4 39.0 - 52.0 %   MCV 91.3 78.0 - 100.0 fL   MCH 29.7 26.0 - 34.0 pg   MCHC 32.5 30.0 - 36.0 g/dL   RDW 15.0 11.5 - 15.5 %   Platelets 240 150 - 400 K/uL  Basic metabolic panel     Status: Abnormal   Collection Time: 03/29/14  2:30 AM  Result Value Ref Range   Sodium 143 135 - 145 mmol/L   Potassium 3.9 3.5 - 5.1 mmol/L   Chloride 106 96 - 112 mmol/L   CO2 27 19 - 32 mmol/L   Glucose, Bld 87 70 - 99 mg/dL   BUN 9 6 - 23 mg/dL   Creatinine, Ser 1.12 0.50 - 1.35 mg/dL   Calcium 9.3 8.4 - 10.5 mg/dL   GFR calc non Af Amer 67 (L) >90 mL/min   GFR calc Af Amer 78 (L) >90 mL/min   Anion gap 10 5 - 15  BNP (order ONLY if patient complains of dyspnea/SOB AND you have documented it for THIS visit)     Status: None   Collection Time: 03/29/14  2:30 AM  Result Value Ref Range   B Natriuretic Peptide 13.8 0.0 - 100.0 pg/mL  I-stat troponin, ED (not at [facility])     Status: None   Collection Time: 03/29/14  2:34 AM  Result Value Ref Range   Troponin i, poc 0.00 0.00 - 0.08 ng/mL   Comment 3          Troponin I (q 6hr x 3)     Status: None   Collection Time: 03/29/14  6:45 AM  Result Value Ref Range   Troponin I <0.03 <0.031 ng/mL    Dg Chest 2 View  03/29/2014   CLINICAL DATA:  Chest pain for 2 days radiating to LEFT arm. Diarrhea and weakness. History of myocardial infarction and atrial fibrillation.  EXAM: CHEST  2 VIEW  COMPARISON:  Chest radiograph January 03, 2014  FINDINGS: The cardiac silhouette remains mildly enlarged. Mediastinal silhouette is nonsuspicious. Increased lung volumes with mildly flattened hemidiaphragms. Hepatic eventration again noted. Bibasilar strandy densities without pleural effusion or focal consolidation. No pneumothorax. Mild osteopenia. Soft tissue planes are nonsuspicious.  IMPRESSION: Mild cardiomegaly.  Bibasilar atelectasis.   Electronically Signed   By: Elon Alas   On: 03/29/2014 03:15     ASSESSMENT: 1. Chest pain    --R/o MI    --Myoview 1/15 inferior infarct no ischemia  2. Tobacco use, ongoing 3.  PAF, not on AC due to ? GIB - now in NSR on amio  4. Schizophrenia  PLAN/DISCUSSION:  By echo and previous Myoview he almost certainly has underlying CAD. Current CP has typical and atypical features. ECG and cardiac enzymes currently normal. He very clearly reports spitting up blood and having BRBPR recently after one Eliquis dose though hemocult was negative and this has not recurred on ASA 81.   Given CRFs and CP, ideally we would cath him to clearly define his coronary anatomy but I think we have to make sure he does not have a severe occult lesion that would bleed rapidy with dual anti-platelet therapy. For now would continue medical therapy. If pain persists or troponins turn positive would ask GI to see for EGD. If EGD ok then we would be comfortable proceeding with cath and possible stenting if needed. Continue asa, b-blocker, statin.  Kashlynn Kundert,MD 8:28 AM

## 2014-03-29 NOTE — Progress Notes (Signed)
Pt echo mildly improved from last echo. Troponin's have been cycled and normal. Patients chest pain has been ruled out as being a ACS. Pt may be discharged. Received a page from nursing he wanted a diet and go smoke. I reviewed discharge plan with nursing and pt stated he did not have a ride home. I spoke with social work that will assist in transportation issues for discharge. Thank you both for aiding in the discharge of this patient.  Howard Pouch DO PGY3 [facility]

## 2014-03-29 NOTE — Care Management Note (Signed)
    Page 1 of 1   03/29/2014     3:25:29 PM CARE MANAGEMENT NOTE 03/29/2014  Patient:  Patrick Holt, Patrick Holt   Account Number:  [id number]  Date Initiated:  03/29/2014  Documentation initiated by:  Elissa Hefty  Subjective/Objective Assessment:   adm w ch pain     Action/Plan:   lives alone, pcp dr Howard Pouch   Anticipated DC Date:  03/30/2014   Anticipated DC Plan:  [place]         [facility] Choice  Resumption Of Svcs/PTA Provider   Choice offered to / List presented to:          [facility] arranged  HH-10 DISEASE MANAGEMENT  HH-1 RN      [facility] agency  [facility]   Status of service:   Medicare Important Message given?   (If response is "NO", the following Medicare IM given date fields will be blank) Date Medicare IM given:   Medicare IM given by:   Date Additional Medicare IM given:   Additional Medicare IM given by:    Discharge Disposition:  [place]  Per UR Regulation:    If discussed at Long Length of Stay Meetings, dates discussed:    Comments:

## 2014-03-29 NOTE — ED Provider Notes (Signed)
CSN: [id number]     Arrival date & time 03/29/14  0206 History  This chart was scribed for Everlene Balls, MD by Chester Holstein, ED Scribe. This patient was seen in room A10C/A10C and the patient's care was started at 3:36 AM.     Chief Complaint  Patient presents with  . Chest Pain    Patient is a 64 y.o. male presenting with chest pain. The history is provided by the patient. No language interpreter was used.  Chest Pain  HPI Comments: HPI Comments: Patrick Holt is a 64 y.o. male brought in by ambulance, with PMHx of DM, MI, subarachnoid hemorrhage, and Afib who presents to the Emergency Department complaining of intermittent sharp left sided chest pain with onset 2 days ago worsening today. Pt notes pain woke him from rest this evening. Pt notes associated numbness in left arm, diaphoresis, SOB and cough. Pt denies aggravation with exertion. Pt notes pain similar to last heart attack.  Pt notes he has been hospitalized a number of times in last 2 years.  Pt takes ASA. Pt denies vomiting and fever.   Past Medical History  Diagnosis Date  . Rheumatoid arthritis(714.0)   . Obesity   . Major depression     with acute psychotic break in 06/2010  . Hypertension   . Hyperlipidemia   . Diverticulosis of colon   . COPD (chronic obstructive pulmonary disease)   . Anxiety   . GERD (gastroesophageal reflux disease)   . Vertigo   . Fibromyalgia   . Dermatomyositis   . Myocardial infarct     mulitple (1999, 2000, 2004)  . Raynaud's disease   . Narcotic dependence   . Peripheral neuropathy   . Internal hemorrhoids   . Ischemic heart disease   . Hiatal hernia   . Gastritis   . Diverticulitis   . Hx of adenomatous colonic polyps   . Nephrolithiasis   . Anemia   . Esophageal stricture   . Esophageal dysmotility   . Dermatomyositis   . Urge incontinence   . Otosclerosis   . Bipolar 1 disorder   . OCD (obsessive compulsive disorder)   . Sarcoidosis   . Paroxysmal a-fib   .  Dysrhythmia     "irregular" (11/15/2012)  . Type II diabetes mellitus   . Seizures   . Headache(784.0)     "severe; get them often" (11/15/2012)  . Subarachnoid hemorrhage 01/2012    with subdural  hematoma.   . Atrial fibrillation 01/2012    with RVR  . Conversion disorder 06/2010  . History of narcotic addiction    Past Surgical History  Procedure Laterality Date  . Knee arthroscopy w/ meniscal repair Left 2009  . Lumbar disc surgery    . Squamous papilloma   2010    removed by Dr. Constance Holster ENT, noted on tongue  . Esophagogastroduodenoscopy N/A 09/27/2012    Procedure: ESOPHAGOGASTRODUODENOSCOPY (EGD);  Surgeon: Lafayette Dragon, MD;  Location: [facility];  Service: Endoscopy;  Laterality: N/A;  . Colonoscopy N/A 09/27/2012    Procedure: COLONOSCOPY;  Surgeon: Lafayette Dragon, MD;  Location: [facility] ENDOSCOPY;  Service: Endoscopy;  Laterality: N/A;  . Cataract extraction w/ intraocular lens implant Left   . Lymph node dissection Right 1970's    "neck; dr thought I had Hodgkins; turned out to be sarcoidosis" (11/15/2012)  . Tonsillectomy    . Carpal tunnel release Bilateral   . Cardiac catheterization    . Back surgery  Family History  Problem Relation Age of Onset  . Alcohol abuse Mother   . Depression Mother   . Heart disease Mother   . Diabetes Mother   . Stroke Mother   . Diabetes Other     1/2 brother  . Hepatitis Brother    History  Substance Use Topics  . Smoking status: Current Every Day Smoker -- 0.50 packs/day for 30 years    Types: Cigarettes  . Smokeless tobacco: Never Used     Comment: 3 cigs a day  . Alcohol Use: No     Comment: Alcohol stopped in September of 2014    Review of Systems  Cardiovascular: Positive for chest pain.   A complete 10 system review of systems was obtained and all systems are negative except as noted in the HPI and PMH.     Allergies  Immune globulins; Ciprofloxacin; Flagyl; Lisinopril; Sulfa antibiotics; Betamethasone dipropionate;  Bupropion hcl; Clobetasol; Codeine; Escitalopram oxalate; Fluoxetine hcl; Fluticasone-salmeterol; Furosemide; Paroxetine; Penicillins; Tacrolimus; Tetanus toxoid; and Tuberculin purified protein derivative  Home Medications   Prior to Admission medications   Medication Sig Start Date End Date Taking? Authorizing Provider  albuterol-ipratropium (COMBIVENT) 18-103 MCG/ACT inhaler Inhale 1 puff into the lungs daily. 06/20/13  Yes Gerda Diss, DO  ALPRAZolam Duanne Moron) 1 MG tablet TAKE 1 TABLET BY MOUTH DAILY 01/21/14  Yes Renee A Kuneff, DO  amiodarone (PACERONE) 200 MG tablet TAKE 1 TABLET BY MOUTH EVERY DAY WITH BREAKFAST 03/07/14  Yes Renee A Kuneff, DO  atorvastatin (LIPITOR) 40 MG tablet Take 1 tablet (40 mg total) by mouth daily. 10/25/13  Yes Renee A Kuneff, DO  divalproex (DEPAKOTE ER) 500 MG 24 hr tablet Take 1 tablet (500 mg total) by mouth daily. 06/20/13  Yes Gerda Diss, DO  levETIRAcetam (KEPPRA) 500 MG tablet TAKE 1 TABLET BY MOUTH TWICE DAILY 01/21/14  Yes Renee A Kuneff, DO  Multiple Vitamin (MULTIVITAMIN WITH MINERALS) TABS tablet Take 1 tablet by mouth daily. 12/29/12  Yes Kandis Nab, MD  NEXIUM 40 MG capsule TAKE ONE CAPSULE BY MOUTH EVERY MORNING WITH BREAKFAST 03/07/14  Yes Renee A Kuneff, DO  oxybutynin (DITROPAN) 5 MG tablet TAKE 1 TABLET BY MOUTH THREE TIMES DAILY 10/18/13  Yes Renee A Kuneff, DO  potassium chloride SA (K-DUR,KLOR-CON) 20 MEQ tablet TAKE 1 TABLET BY MOUTH EVERY DAY 10/18/13  Yes Renee A Kuneff, DO  rOPINIRole (REQUIP) 2 MG tablet TAKE 1 TABLET BY MOUTH THREE TIMES DAILY 03/07/14  Yes Renee A Kuneff, DO  esomeprazole (NEXIUM) 40 MG capsule Take 40 mg by mouth daily at 12 noon.    Historical Provider, MD  ondansetron (ZOFRAN ODT) 4 MG disintegrating tablet 4mg  ODT q4 hours prn nausea/vomit Patient not taking: Reported on 03/29/2014 10/09/13   Mariea Clonts, MD   BP 142/90 mmHg  Pulse 66  Temp(Src) 98.5 F (36.9 C) (Oral)  Resp 16  Ht 5\' 5"  (1.651 m)  Wt  212 lb (96.163 kg)  BMI 35.28 kg/m2  SpO2 96% Physical Exam  Constitutional: He is oriented to person, place, and time. Vital signs are normal. He appears well-developed and well-nourished.  Non-toxic appearance. He does not appear ill. No distress.  HENT:  Head: Normocephalic and atraumatic.  Nose: Nose normal.  Mouth/Throat: Oropharynx is clear and moist. No oropharyngeal exudate.  Eyes: Conjunctivae and EOM are normal. Pupils are equal, round, and reactive to light. No scleral icterus.  Neck: Normal range of motion. Neck supple. No tracheal deviation,  no edema, no erythema and normal range of motion present. No thyroid mass and no thyromegaly present.  Cardiovascular: Normal rate, regular rhythm, S1 normal, S2 normal, normal heart sounds, intact distal pulses and normal pulses.  Exam reveals no gallop and no friction rub.   No murmur heard. Pulses:      Radial pulses are 2+ on the right side, and 2+ on the left side.       Dorsalis pedis pulses are 2+ on the right side, and 2+ on the left side.  Pulmonary/Chest: Effort normal and breath sounds normal. No respiratory distress. He has no wheezes. He has no rhonchi. He has no rales.  Abdominal: Soft. Normal appearance and bowel sounds are normal. He exhibits no distension, no ascites and no mass. There is no hepatosplenomegaly. There is no tenderness. There is no rebound, no guarding and no CVA tenderness.  Musculoskeletal: Normal range of motion. He exhibits no edema or tenderness.  Lymphadenopathy:    He has no cervical adenopathy.  Neurological: He is alert and oriented to person, place, and time. He has normal strength. No cranial nerve deficit or sensory deficit.  Skin: Skin is warm, dry and intact. No petechiae and no rash noted. He is not diaphoretic. No erythema. No pallor.  Psychiatric: He has a normal mood and affect. His behavior is normal. Judgment normal.  Nursing note and vitals reviewed.   ED Course  Procedures (including  critical care time) DIAGNOSTIC STUDIES: Oxygen Saturation is 96% on room air, normal by my interpretation.    COORDINATION OF CARE: 3:43 AM Discussed treatment plan with patient at beside, the patient agrees with the plan and has no further questions at this time.   Labs Review Labs Reviewed  BASIC METABOLIC PANEL - Abnormal; Notable for the following:    GFR calc non Af Amer 67 (*)    GFR calc Af Amer 78 (*)    All other components within normal limits  MRSA PCR SCREENING  CBC  BRAIN NATRIURETIC PEPTIDE  TROPONIN I  TROPONIN I  GLUCOSE, CAPILLARY  GLUCOSE, CAPILLARY  TROPONIN I  TROPONIN I  I-STAT TROPOININ, ED    Imaging Review Dg Chest 2 View  03/29/2014   CLINICAL DATA:  Chest pain for 2 days radiating to LEFT arm. Diarrhea and weakness. History of myocardial infarction and atrial fibrillation.  EXAM: CHEST  2 VIEW  COMPARISON:  Chest radiograph January 03, 2014  FINDINGS: The cardiac silhouette remains mildly enlarged. Mediastinal silhouette is nonsuspicious. Increased lung volumes with mildly flattened hemidiaphragms. Hepatic eventration again noted. Bibasilar strandy densities without pleural effusion or focal consolidation. No pneumothorax. Mild osteopenia. Soft tissue planes are nonsuspicious.  IMPRESSION: Mild cardiomegaly.  Bibasilar atelectasis.   Electronically Signed   By: Elon Alas   On: 03/29/2014 03:15     EKG Interpretation   Date/Time:  Friday March 29 2014 02:20:11 EST Ventricular Rate:  67 PR Interval:  177 QRS Duration: 85 QT Interval:  434 QTC Calculation: 458 R Axis:   10 Text Interpretation:  Sinus rhythm No significant change since last  tracing Confirmed by Glynn Octave [phone]) on 03/29/2014 2:57:10 AM      MDM   Final diagnoses:  None    Patient presents for CP similar to his prior MI. History is consistent with ACS, will admit to [facility] for continued evaluation. He took ASA, nitro given in the ED.  Troponin is neg and EKG  is non-ischemic.  I personally performed the services  described in this documentation, which was scribed in my presence. The recorded information has been reviewed and is accurate.     Everlene Balls, MD 03/29/14 1505

## 2014-03-29 NOTE — Discharge Instructions (Signed)
Chest Pain Observation It is often hard to give a specific diagnosis for the cause of chest pain. Among other possibilities your symptoms might be caused by inadequate oxygen delivery to your heart (angina). Angina that is not treated or evaluated can lead to a heart attack (myocardial infarction) or death. Blood tests, electrocardiograms, and X-rays may have been done to help determine a possible cause of your chest pain. After evaluation and observation, your health care provider has determined that it is unlikely your pain was caused by an unstable condition that requires hospitalization. However, a full evaluation of your pain may need to be completed, with additional diagnostic testing as directed. It is very important to keep your follow-up appointments. Not keeping your follow-up appointments could result in permanent heart damage, disability, or death. If there is any problem keeping your follow-up appointments, you must call your health care provider. HOME CARE INSTRUCTIONS  Due to the slight chance that your pain could be angina, it is important to follow your health care provider's treatment plan and also maintain a healthy lifestyle:  Maintain or work toward achieving a healthy weight.  Stay physically active and exercise regularly.  Decrease your salt intake.  Eat a balanced, healthy diet. Talk to a dietitian to learn about heart-healthy foods.  Increase your fiber intake by including whole grains, vegetables, fruits, and nuts in your diet.  Avoid situations that cause stress, anger, or depression.  Take medicines as advised by your health care provider. Report any side effects to your health care provider. Do not stop medicines or adjust the dosages on your own.  Quit smoking. Do not use nicotine patches or gum until you check with your health care provider.  Keep your blood pressure, blood sugar, and cholesterol levels within normal limits.  Limit alcohol intake to no more than  1 drink per day for women who are not pregnant and 2 drinks per day for men.  Do not abuse drugs. SEEK IMMEDIATE MEDICAL CARE IF: You have severe chest pain or pressure which may include symptoms such as:  You feel pain or pressure in your arms, neck, jaw, or back.  You have severe back or abdominal pain, feel sick to your stomach (nauseous), or throw up (vomit).  You are sweating profusely.  You are having a fast or irregular heartbeat.  You feel short of breath while at rest.  You notice increasing shortness of breath during rest, sleep, or with activity.  You have chest pain that does not get better after rest or after taking your usual medicine.  You wake from sleep with chest pain.  You are unable to sleep because you cannot breathe.  You develop a frequent cough or you are coughing up blood.  You feel dizzy, faint, or experience extreme fatigue.  You develop severe weakness, dizziness, fainting, or chills. Any of these symptoms may represent a serious problem that is an emergency. Do not wait to see if the symptoms will go away. Call your local emergency services (911 in the [place] Do not drive yourself to the hospital. MAKE SURE YOU:  Understand these instructions.  Will watch your condition.  Will get help right away if you are not doing well or get worse. Document Released: 03/06/2010 Document Revised: 02/06/2013 Document Reviewed: 08/03/2012 [facility] Patient Information 2015 [place], [place]. This information is not intended to replace advice given to you by your health care provider. Make sure you discuss any questions you have with your health care provider.  Make certain to follow up with your cardiologist outpatient.

## 2014-03-29 NOTE — H&P (Signed)
FMTS Attending Admission Note: Annabell Sabal MD Personal pager:  [phone] FPTS Service Pager:  [phone]  HPI:  [name] is a 64 yo M very well known to me with a history of HTN, ongoing smoker, DM2, COPD, dermatomyotisis, and several psychotic episodes who presents with several day history of inconsistent chest pain.  Not associated with exertion.  Pain worsened yesterday with alternating sharp/stabbing and pressure.  Also with dyspnea. Denied any diaphoresis or nausea.  Specifically denied any vomiting or hematemesis.  Did report cough for past 1-2 weeks but denied any hemoptysis.    Exam: This AM, he looks much more alert than usually when I see him.  Well groomed.  Heart RRR, Lungs with diffuse wheezing in lower fields.  Some upper airway sounds transmitted as well.  Chest wall is TTP along lateral Left chest.  Abdomen is benign today. LE's clear  Imp/PlaN; 1.  Chest pain: - already seen by cardiology, appreciate input - does NOT sound like true cardiac pain.   Burtis Junes MSK origin.  He is asking for dilaudid -- limit narcotics. - Tylenol or Ultram to treat - Possible cath per cards -- high likelihood of CAD.   2.  COPD - favor exacerbation.  Lungs sound bad.  Ongoing smoker. - Only used Combivent once a day, no other inhalers.  - Add bronchodilators and Prednisone for usual COPD treatment.   3.  Questionable hematemesis: - Of note -- patient has history of very inconsistent symptoms. - specifically denied this with me BUT did endorse with cardiology - does have hx/o GIB so favor cautious approach  Other chronic medical issues are stable.    Alveda Reasons, MD 03/29/2014 8:47 AM

## 2014-03-29 NOTE — Consult Note (Signed)
Patient had previously been active with [facility]. Talked at length with patient regarding care management needs.  He requested the restart of services as a benefit of his [facility] Medicare.  Patient will receive calls and been evaluated for home visits. Consent form signed and folder with [facility] information given.  Of note, [facility] Care Management does not replace or interfere with any services arranged by the inpatient care management staff.  For questions, please call Natividad Brood, RN, BSN, [facility] Liaison at [phone].

## 2014-04-18 ENCOUNTER — Other Ambulatory Visit: Payer: Self-pay | Admitting: Family Medicine

## 2014-04-23 ENCOUNTER — Ambulatory Visit (INDEPENDENT_AMBULATORY_CARE_PROVIDER_SITE_OTHER): Payer: Commercial Managed Care - HMO | Admitting: Family Medicine

## 2014-04-23 ENCOUNTER — Encounter: Payer: Self-pay | Admitting: Family Medicine

## 2014-04-23 VITALS — BP 156/79 | HR 70 | Temp 98.2°F | Ht 65.0 in

## 2014-04-23 DIAGNOSIS — E118 Type 2 diabetes mellitus with unspecified complications: Secondary | ICD-10-CM

## 2014-04-23 DIAGNOSIS — Z5900 Homelessness unspecified: Secondary | ICD-10-CM

## 2014-04-23 DIAGNOSIS — F411 Generalized anxiety disorder: Secondary | ICD-10-CM

## 2014-04-23 LAB — POCT GLYCOSYLATED HEMOGLOBIN (HGB A1C): HEMOGLOBIN A1C: 5.6

## 2014-04-23 MED ORDER — ALPRAZOLAM 1 MG PO TABS: 1.0000 mg | ORAL_TABLET | Freq: Two times a day (BID) | ORAL | Status: DC | PRN

## 2014-04-23 NOTE — Patient Instructions (Signed)
We have refilled your xanax, you are to take this no more than 2 times a day.  We have completed your foot exam today. Please attempt to get your diabetic shoes. If they have problems the insurance company can ask for OV information.  I have made a referral for podiatry, try to go to this appointment. You can ask about co-pay.

## 2014-04-25 DIAGNOSIS — Z5900 Homelessness unspecified: Secondary | ICD-10-CM | POA: Insufficient documentation

## 2014-04-25 NOTE — Assessment & Plan Note (Signed)
Uncertain prior history on diabetes, considering highest a1c is 6.0. Patient is currently not on medications and is diet controlled with repeat a1c today of 5.6. Patient does have peripheral neuropathy, unknown if diabetes in origin.  Script for diabetic shoes given to him and he was encouraged to take to the medical supply store for fitting. Documented foot exam today.  Pts foot exam warranted a podiatry referral with multiple thick callus formations, and long thickened toenails. Pt is unable to see or reach the bottom of his feet. He is uncertain if he will be able to afford this if there is a co-pay.  Patient was encouraged to talk with the podiatry office to see if there is a copay for him and go if able. He should also get diabetic shoes if able.  Continue to watch his diet.  F/U 3 months

## 2014-04-25 NOTE — Progress Notes (Signed)
Subjective:    Patient ID: Patrick Holt, male    DOB: 1950-03-03, 64 y.o.   MRN: [id number]  HPI  Neuropathy: patint presents to [facility] today with [facility] nurse, to discuss his diabetes and neuropathy. He is requesting diabetic shoes, and has a prescription but is concerned his insurance will not pay for them. He is here today to get his foot exam. Patient has a history of  Diabetes that had been controlled on glipizide. His highest a1c in the system is 6.0 in 2013. It appears he was started on glipizide in 08/2010 and taken off in 2014. He has been diet controlled since then. Full history is difficult to obtain as he confabulates his history. It is possible that his steroid use for RA/dermatomyositis may have increased his sugar, requiring  Medications at that time. He has remained diet controlled with last A1c 5.8 in 12/2013. He is wheel chair bound, and had a poor social/medical  history. He denies any hyper/hypoglycemic events or visual changes. He endorses neuropathy of bilateral feet, with burning sensations. He denies any open or non-healing wounds of his lower extremities.   Concern with living arrangements: Patient unfortunately is having to relocate at the end of this month, secondary to his home (renting) being condemned. Social work and [facility] are working with him to find him living arrangements. He states they found the pipes were made out of lead and he is concerned he has lead poisoning  because he was not feeling well. He states he is better now though.   Anxiety: Pt has increased anxiety from his normal anxious baseline secondary to his living arrangements being condemned and he is fearful he will be homeless (see above). He is normally prescribed xanax 1 mg daily, but has felt he needed more and has been taking 2-3 a day.   Current Everyday smoker Past Medical History  Diagnosis Date  . Rheumatoid arthritis(714.0)   . Obesity   . Major depression     with acute psychotic break in  06/2010  . Hypertension   . Hyperlipidemia   . Diverticulosis of colon   . COPD (chronic obstructive pulmonary disease)   . Anxiety   . GERD (gastroesophageal reflux disease)   . Vertigo   . Fibromyalgia   . Dermatomyositis   . Myocardial infarct     mulitple (1999, 2000, 2004)  . Raynaud's disease   . Narcotic dependence   . Peripheral neuropathy   . Internal hemorrhoids   . Ischemic heart disease   . Hiatal hernia   . Gastritis   . Diverticulitis   . Hx of adenomatous colonic polyps   . Nephrolithiasis   . Anemia   . Esophageal stricture   . Esophageal dysmotility   . Dermatomyositis   . Urge incontinence   . Otosclerosis   . Bipolar 1 disorder   . OCD (obsessive compulsive disorder)   . Sarcoidosis   . Paroxysmal a-fib   . Dysrhythmia     "irregular" (11/15/2012)  . Type II diabetes mellitus   . Seizures   . Headache(784.0)     "severe; get them often" (11/15/2012)  . Subarachnoid hemorrhage 01/2012    with subdural  hematoma.   . Atrial fibrillation 01/2012    with RVR  . Conversion disorder 06/2010  . History of narcotic addiction    Allergies  Allergen Reactions  . Immune Globulins Other (See Comments)    Acute renal failure  . Ciprofloxacin Swelling  . Flagyl [  Metronidazole] Swelling  . Lisinopril Diarrhea  . Sulfa Antibiotics Other (See Comments)    blisters  . Betamethasone Dipropionate     Unknown  . Bupropion Hcl     Unknown  . Clobetasol     Unknown  . Codeine     Unknown  . Escitalopram Oxalate     Unknown  . Fluoxetine Hcl     Unknown  . Fluticasone-Salmeterol     Unknown  . Furosemide     Unknown  . Paroxetine     Unknown  . Penicillins     Unknown  . Tacrolimus     Unknown  . Tetanus Toxoid     Unknown  . Tuberculin Purified Protein Derivative     Unknown    Review of Systems Per HPI    Objective:   Physical Exam BP 156/79 mmHg  Pulse 70  Temp(Src) 98.2 F (36.8 C) (Oral)  Ht 5\' 5"  (1.651 m) Gen: NAD. Non-toxic.  In wheelchair, well developed, well nourished. Obese. HEENT: AT. [place]. Bilateral eyes without injections or icterus. MMM. Ext: No erythema. No edema. Diminished but equal pulses bilateral LE.  Psych: Normal dress, anxious, confabulates, tangential speech, slowed behavior Foot exam completed and documented in quality metrics     Assessment & Plan:

## 2014-04-25 NOTE — Assessment & Plan Note (Signed)
Discussion with pt today about his anxiety and medications. We have agreed on xanax 1 mg BID. A new script has been given to him today that he may refill mid April with BID dosing for 3 months. Pt states understanding of not overusing, as we will not prescribe additional script if he runs out early.

## 2014-04-25 NOTE — Assessment & Plan Note (Signed)
Discussion today surrounding his living arrangements, which he will be homeless at the end of this month if he is unable to secure a new living arrangement. He is working to find a new place to rent, but is having difficulty. [facility] and social work have been helping him and have assured him they will find him something by the end of the month.  If they need further assistance our office social work would be able to help.

## 2014-04-29 ENCOUNTER — Other Ambulatory Visit: Payer: Self-pay | Admitting: Family Medicine

## 2014-05-03 ENCOUNTER — Ambulatory Visit: Payer: Self-pay | Admitting: Internal Medicine
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
## 2014-05-09 IMAGING — CR DG CHEST 1V
1 series · 1 of 1 positions shown · non-contrast
Comparison: 07/06/2010

CLINICAL DATA: Tachycardia

CHEST - 1 VIEW

[x chest ap]
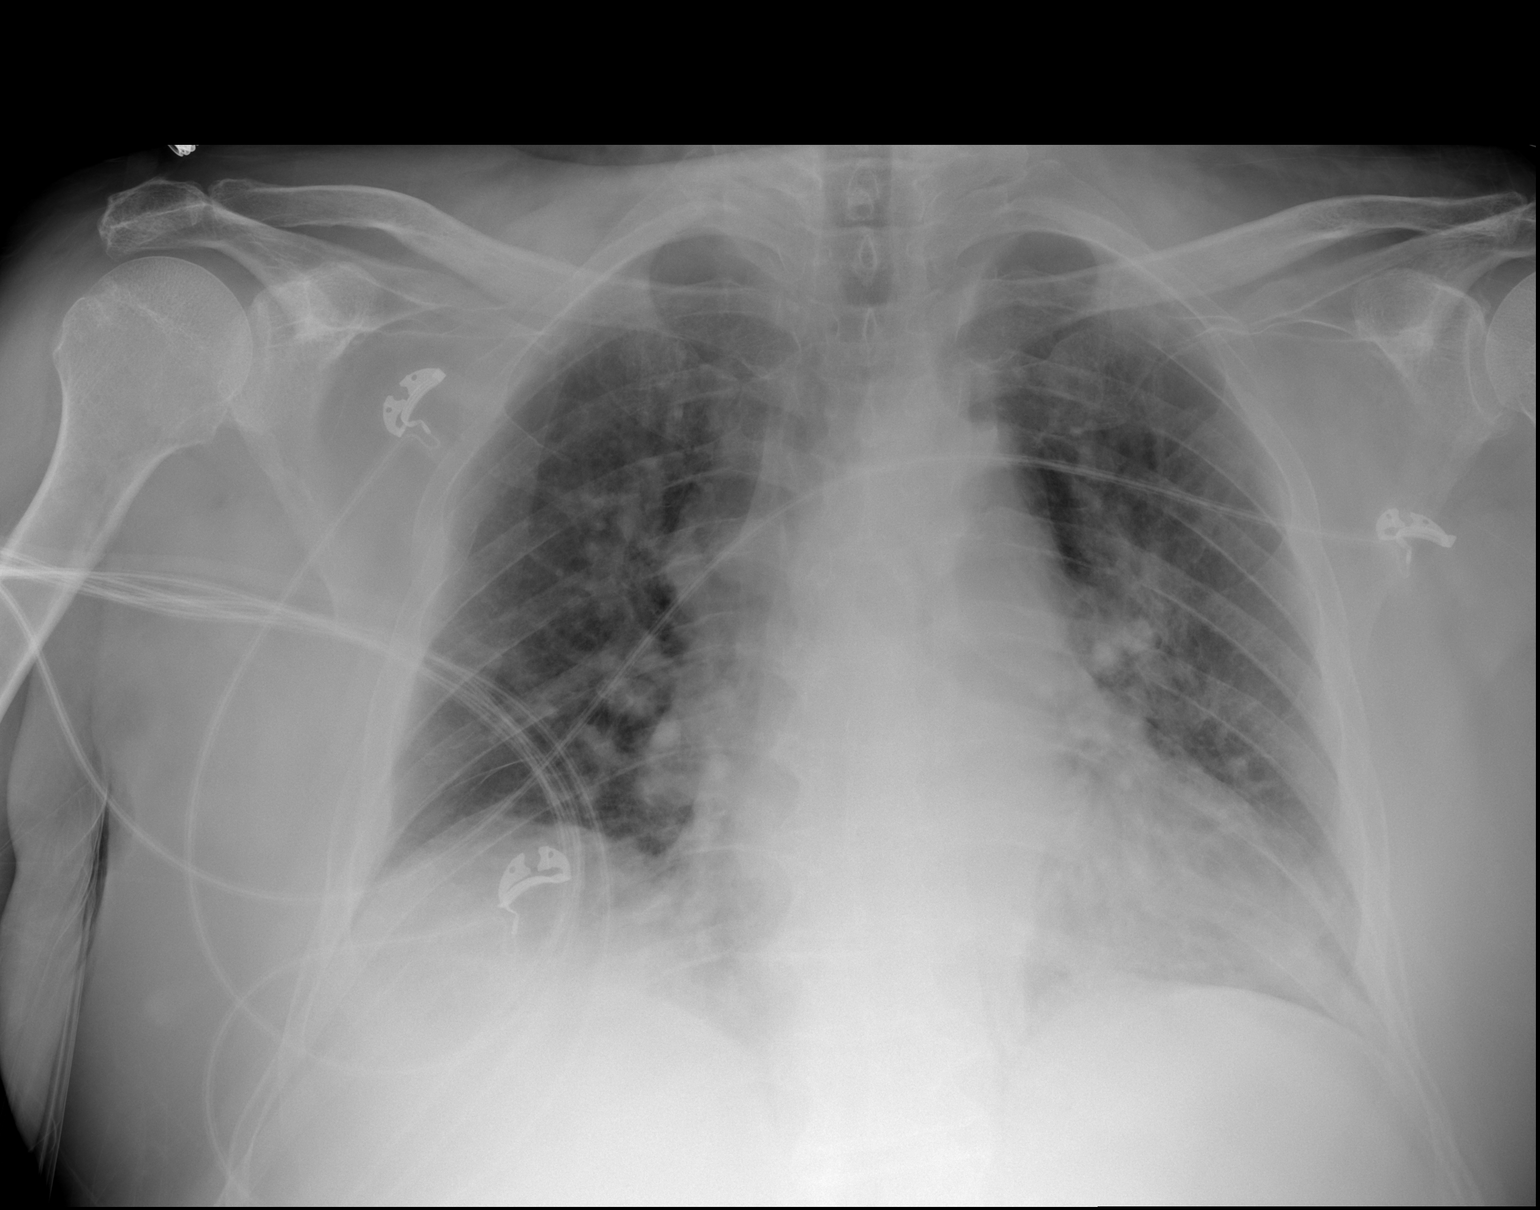

[1 of 1 positions shown; findings below may reference images not displayed]

FINDINGS: Low lung volumes noted with cardiac enlargement and
vascular congestion versus early edema.  Basilar atelectasis also
evident.  Right hemidiaphragm is elevated.  No large effusion or
pneumothorax.  Trachea is midline.  Degenerative changes of the
spine.
IMPRESSION: Low volume exam with atelectasis

Cardiomegaly with vascular congestion versus early edema pattern
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
## 2014-05-20 ENCOUNTER — Other Ambulatory Visit: Payer: Self-pay | Admitting: *Deleted

## 2014-05-20 NOTE — Patient Outreach (Signed)
Care manager called member to follow up on member's "Move Meeting" with [employer] and Legal [employer] attorney last month concerning current living conditions.  Member reports that he does not have to move because the landlord was able to fix all of the problems/issues in the home.  Home visit scheduled for this Thursday.  Will discharge member at that time if there are no further concerns.  Valente David, BSN, [facility]  [facility] Care Manager [phone]

## 2014-05-23 ENCOUNTER — Other Ambulatory Visit: Payer: Self-pay | Admitting: *Deleted

## 2014-05-23 ENCOUNTER — Encounter: Payer: Self-pay | Admitting: *Deleted
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
## 2014-05-24 ENCOUNTER — Encounter: Payer: Self-pay | Admitting: *Deleted

## 2014-05-27 ENCOUNTER — Ambulatory Visit: Payer: Self-pay | Admitting: Internal Medicine

## 2014-05-27 NOTE — Patient Outreach (Signed)
[facility]) Care Management   05/23/2014   LUC SHAMMAS 22-May-1950 [id number]  NAWAF STRANGE is an 64 y.o. male  Subjective:   Member states that he is "not doing well" and still having pain.  He states that he has not taken any of the over the counter pain medications despite multiple conversations about it.  He still requests narcotics, but is made aware that per his primary care physician, he will not be given narcotics.    Objective:   Review of Systems  Musculoskeletal: Positive for back pain.    Physical Exam  Constitutional: He is oriented to person, place, and time. He appears well-developed and well-nourished.  Neck: Normal range of motion.  Cardiovascular: Normal rate, regular rhythm and normal heart sounds.   Respiratory: Effort normal and breath sounds normal.  GI: Soft. Bowel sounds are normal.  Musculoskeletal: Normal range of motion.  Neurological: He is alert and oriented to person, place, and time.  Skin: Skin is warm and dry.   BP 128/78 mmHg  Pulse 70  Resp 16  Wt 215 lb (97.523 kg)  SpO2 94%  Current Medications:   Current Outpatient Prescriptions  Medication Sig Dispense Refill  . albuterol-ipratropium (COMBIVENT) 18-103 MCG/ACT inhaler Inhale 1 puff into the lungs daily. 14.7 g 5  . ALPRAZolam (XANAX) 1 MG tablet Take 1 tablet (1 mg total) by mouth 2 (two) times daily as needed for anxiety. 180 tablet 0  . amiodarone (PACERONE) 200 MG tablet TAKE 1 TABLET BY MOUTH EVERY DAY WITH BREAKFAST 90 tablet 3  . atorvastatin (LIPITOR) 40 MG tablet TAKE 1 TABLET BY MOUTH DAILY 90 tablet 1  . divalproex (DEPAKOTE ER) 500 MG 24 hr tablet Take 1 tablet (500 mg total) by mouth daily. 60 tablet 2  . esomeprazole (NEXIUM) 40 MG capsule Take 40 mg by mouth daily at 12 noon.    . levETIRAcetam (KEPPRA) 500 MG tablet TAKE 1 TABLET BY MOUTH TWICE DAILY 60 tablet 7  . Multiple Vitamin (MULTIVITAMIN WITH MINERALS) TABS tablet Take 1 tablet by mouth  daily. 90 tablet 3  . oxybutynin (DITROPAN) 5 MG tablet TAKE 1 TABLET BY MOUTH THREE TIMES DAILY 270 tablet 2  . potassium chloride SA (K-DUR,KLOR-CON) 20 MEQ tablet TAKE 1 TABLET BY MOUTH EVERY DAY 90 tablet 0  . rOPINIRole (REQUIP) 2 MG tablet TAKE 1 TABLET BY MOUTH THREE TIMES DAILY 90 tablet 3  . NEXIUM 40 MG capsule TAKE ONE CAPSULE BY MOUTH EVERY MORNING WITH BREAKFAST (Patient not taking: Reported on 05/23/2014) 90 capsule 3  . ondansetron (ZOFRAN ODT) 4 MG disintegrating tablet 71m ODT q4 hours prn nausea/vomit (Patient not taking: Reported on 03/29/2014) 4 tablet 0   No current facility-administered medications for this visit.    Functional Status:   In your present state of health, do you have any difficulty performing the following activities: 05/23/2014 03/29/2014  Hearing? N N  Vision? Y Y  Difficulty concentrating or making decisions? Y N  Walking or climbing stairs? Y Y  Dressing or bathing? N Y  Doing errands, shopping? N Y  PConservation officer, natureand eating ? N -  Using the Toilet? N -  In the past six months, have you accidently leaked urine? N -  Do you have problems with loss of bowel control? N -  Managing your Medications? N -  Managing your Finances? N -  Housekeeping or managing your Housekeeping? N -    Fall/Depression Screening:  PHQ 2/9 Scores 08/09/2013 06/20/2013 01/10/2013 08/03/2011 07/22/2011 05/03/2011  PHQ - 2 Score 0 0 0 3 0 4  PHQ- 9 Score - - - 14 - 17    Assessment:    Met with member for a discharge visit.  Social worker, C. Land, also present during visit.  Member has met goals of decrease in his anxiety level, most in part by having the requirements in his home fixed and able to pass inspection.  He also states that he has been able to sleep better with the increase in xanax from once daily to twice daily.  He reports that he still want to move, and was advised by Mrs. Land that it is still possible to do so, and he was informed that he now has more time to  prepare.    Other than pain, member denies any further health concerns or questions.  Pain interventions again discussed, but member refuses to take anything over the counter medications to relieve pain.  Member made aware that his case would be closed and that he could continue to use the 24 hour nurse line.  He was also advised that if there was a change in his situation that he could contact the main office.    Plan:   Will send request to care manager assistant to close case. Will send case closure letters to both member and physician.  [place]        Patient Outreach from 05/23/2014 in [place] Most recent reading at 05/23/2014 12:45 PM   Care Plan Problem One  anxiety related to possible homelessness   Care Plan for Problem One  Active   Interventions for Problem One Long Term Goal  Social worker actively involved with Legal Aide attorney to find somewhere for member to move   [place] Term Goal (31-90 days)  Member will have new residence by 06/15/14   [facility] Long Term Goal Start Date  05/02/14   [facility] CM Short Term Goal #1 (0-30 days)  member will take Xanax as prescribed over the next 3-4 weeks    [facility] CM Short Term Goal #1 Start Date  04/23/14   [facility] CM Short Term Goal #1 Met Date  05/23/14   Care Plan Problem Two  Pain Control   Care Plan for Problem Two  Active   [facility] CM Short Term Goal #1 (0-30 days)  Member will report taking over the counter pain medicine over the next 2-4 weeks to decrease pain    [facility] CM Short Term Goal #1 Start Date  04/23/14   [facility] CM Short Term Goal #1 Met Date   -- [Goal not met, member refuses to take Tylenol or other over the counter pain medications..  Member requests narcotic, but per primary care physician, narcotics is not an option for him]   Care Plan Problem Three  housing   Care Plan for Problem Three  Active   [facility] CM Short Term Goal #1 (0-30 days)  Patient to receive options from social worker for affordable housing within  30 days   [facility] CM Short Term Goal #1 Start Date  04/10/14   [facility] CM Short Term Goal #1 Met Date  05/09/14      Valente David, BSN, [place] Manager [phone]

## 2014-05-28 ENCOUNTER — Encounter: Payer: Self-pay | Admitting: *Deleted

## 2014-06-14 ENCOUNTER — Encounter: Payer: Self-pay | Admitting: Internal Medicine

## 2014-06-18 NOTE — Patient Outreach (Signed)
[facility]) Care Management  06/18/2014  Patrick Holt [birth year] [id number]   Received notification from Valente David, RN to close case due to goals met.  [name]. [place] CM Assistant Phone: [phone] Fax: [phone]

## 2014-06-25 ENCOUNTER — Encounter: Payer: Self-pay | Admitting: Family Medicine

## 2014-06-25 ENCOUNTER — Ambulatory Visit (INDEPENDENT_AMBULATORY_CARE_PROVIDER_SITE_OTHER): Payer: Commercial Managed Care - HMO | Admitting: Family Medicine

## 2014-06-25 VITALS — BP 152/83 | HR 70 | Temp 98.4°F | Ht 65.0 in

## 2014-06-25 DIAGNOSIS — H547 Unspecified visual loss: Secondary | ICD-10-CM | POA: Diagnosis not present

## 2014-06-25 DIAGNOSIS — K5900 Constipation, unspecified: Secondary | ICD-10-CM

## 2014-06-25 DIAGNOSIS — K5732 Diverticulitis of large intestine without perforation or abscess without bleeding: Secondary | ICD-10-CM

## 2014-06-25 MED ORDER — DIVALPROEX SODIUM ER 500 MG PO TB24: 500.0000 mg | ORAL_TABLET | Freq: Every day | ORAL | Status: DC

## 2014-06-25 MED ORDER — POLYETHYLENE GLYCOL 3350 17 GM/SCOOP PO POWD: 17.0000 g | Freq: Every day | ORAL | Status: DC

## 2014-06-25 NOTE — Assessment & Plan Note (Addendum)
He has a history of irritable bowel disease, dermatomyositis and diverticulitis, currently it sounds like he has more constipation with some diarrhea overflow. There are no red flags on exam, he is afebrile, and no weight loss, no blood per stool. History of colon polyps, last colonoscopy 2014 2 tubular adenomas  2 mm to 7 mm were removed, no malignancy identified. - Start Mira lax daily, this has been prescribed and explained to patient on how to titrate up or down depending upon his bowel movements. He has been advised to place one capful in 8 ounces of water to start daily. - Per his request, I will place a new referral for a gastroenterology appointment for his condition. He is requesting to change gastroenterologists in seek a second opinion. - Follow-up as needed

## 2014-06-25 NOTE — Progress Notes (Signed)
Patient ID: Patrick Holt, male   DOB: Apr 02, 1950, 64 y.o.   MRN: [id number]   Subjective:    Patient ID: Patrick Holt, male    DOB: 12-16-1950, 64 y.o.   MRN: [id number]  HPI  Constipation:  Patient state he is fearful that something wrong with his colon again. He has  Follow-up with Dr. Maurene Capes , gastroenterologist over the years, but feels she may be missing something. He states that he feels like he has diverticulitis frequently, and has been on "multiple rounds of antibiotics".  Patient feels that he is eating light meals, and less food , but he feels he is gaining weight. He admits to constipation , with difficulty having any bowel movement. He states that he sits on the toilet for at least an hour, and has difficulty going. He denies any hematochezia or melena, or painful elimination.  Decreased Visual acuity: Patient states that he has been experiencing decreased visual acuity over the past few months. 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 He states his left eye he's noticed that he's had 2 hold things closer in order to see them, especially with reading. He has picked himself up some reading glasses, but he does not feel like these are helpful. He is requesting a referral to an eye doctor today.  Anxiety: Pt concerned his xanax prescription was in accurate last time. He felt there were 240 prescribed and her only received 180 (which is his three-month quantity). He states he has been taking them as directed, which is 1 mg BID. He feels his anxiety is better because his living arrangements have been settled and it is "stable". He has been able to get furniture in his home and now has a bed to sleep on, and does not have concerns of being evicted.  Current Everyday smoker Past Medical History  Diagnosis Date  . Rheumatoid arthritis(714.0)   . Obesity   . Major depression     with acute psychotic break in 06/2010  . Hypertension   .  Hyperlipidemia   . Diverticulosis of colon   . COPD (chronic obstructive pulmonary disease)   . Anxiety   . GERD (gastroesophageal reflux disease)   . Vertigo   . Fibromyalgia   . Dermatomyositis   . Myocardial infarct     mulitple (1999, 2000, 2004)  . Raynaud's disease   . Narcotic dependence   . Peripheral neuropathy   . Internal hemorrhoids   . Ischemic heart disease   . Hiatal hernia   . Gastritis   . Diverticulitis   . Hx of adenomatous colonic polyps   . Nephrolithiasis   . Anemia   . Esophageal stricture   . Esophageal dysmotility   . Dermatomyositis   . Urge incontinence   . Otosclerosis   . Bipolar 1 disorder   . OCD (obsessive compulsive disorder)   . Sarcoidosis   . Paroxysmal a-fib   . Dysrhythmia     "irregular" (11/15/2012)  . Type II diabetes mellitus   . Seizures   . Headache(784.0)     "severe; get them often" (11/15/2012)  . Subarachnoid hemorrhage 01/2012    with subdural  hematoma.   . Atrial fibrillation 01/2012    with RVR  . Conversion disorder 06/2010  . History of narcotic addiction    Allergies  Allergen Reactions  . Immune Globulins Other (See Comments)    Acute renal failure  . Ciprofloxacin  Swelling  . Flagyl [Metronidazole] Swelling  . Lisinopril Diarrhea  . Sulfa Antibiotics Other (See Comments)    blisters  . Betamethasone Dipropionate     Unknown  . Bupropion Hcl     Unknown  . Clobetasol     Unknown  . Codeine     Unknown  . Escitalopram Oxalate     Unknown  . Fluoxetine Hcl     Unknown  . Fluticasone-Salmeterol     Unknown  . Furosemide     Unknown  . Paroxetine     Unknown  . Penicillins     Unknown  . Tacrolimus     Unknown  . Tetanus Toxoid     Unknown  . Tuberculin Purified Protein Derivative     Unknown    Review of Systems Per HPI    Objective:   Physical Exam BP 152/83 mmHg  Pulse 70  Temp(Src) 98.4 F (36.9 C) (Oral)  Ht 5\' 5"  (1.651 m)  Wt  Gen: NAD. Non-toxic. In wheelchair, well  developed, well nourished. Obese. HEENT: AT. [place]. Bilateral eyes without injections or icterus. MMM. ABD: soft, mild diffuse tenderness. ND. No masses palpated.  Psych: Anxious, confabulates, tangential speech.     Assessment & Plan:

## 2014-06-25 NOTE — Patient Instructions (Signed)
It was nice to see you again. We will get you a referral to a new GI doctor and and eye doctor. I will call you in Miralax for you to try for your constipation.  Make a follow up appointment with this clinic in July or August

## 2014-06-25 NOTE — Assessment & Plan Note (Deleted)
He has a history of your bowel disease, currently it sounds like he has more constipation with some diarrhea overflow. - Start Mira lax daily, this has been prescribed and explained to patient on how to titrate up or down depending upon his bowel movements. He has been advised to place one capful in 8 ounces of water to start daily. - Per his request I will place a new referral for a gastroenterology appointment for his condition. He is requesting to change gastroenterologists.

## 2014-06-26 ENCOUNTER — Emergency Department (HOSPITAL_COMMUNITY)
Admission: EM | Admit: 2014-06-26 | Discharge: 2014-06-26 | Disposition: A | Discharge status: Home / Self Care | Payer: Commercial Managed Care - HMO | Attending: Emergency Medicine | Admitting: Emergency Medicine

## 2014-06-26 ENCOUNTER — Emergency Department (HOSPITAL_COMMUNITY): Payer: Commercial Managed Care - HMO

## 2014-06-26 ENCOUNTER — Encounter (HOSPITAL_COMMUNITY): Payer: Self-pay | Admitting: Emergency Medicine

## 2014-06-26 ENCOUNTER — Other Ambulatory Visit: Payer: Self-pay

## 2014-06-26 DIAGNOSIS — S0990XA Unspecified injury of head, initial encounter: Secondary | ICD-10-CM | POA: Diagnosis present

## 2014-06-26 DIAGNOSIS — E785 Hyperlipidemia, unspecified: Secondary | ICD-10-CM | POA: Diagnosis not present

## 2014-06-26 DIAGNOSIS — Y9389 Activity, other specified: Secondary | ICD-10-CM | POA: Insufficient documentation

## 2014-06-26 DIAGNOSIS — E119 Type 2 diabetes mellitus without complications: Secondary | ICD-10-CM | POA: Diagnosis not present

## 2014-06-26 DIAGNOSIS — Z8719 Personal history of other diseases of the digestive system: Secondary | ICD-10-CM | POA: Diagnosis not present

## 2014-06-26 DIAGNOSIS — Z862 Personal history of diseases of the blood and blood-forming organs and certain disorders involving the immune mechanism: Secondary | ICD-10-CM | POA: Diagnosis not present

## 2014-06-26 DIAGNOSIS — Z87442 Personal history of urinary calculi: Secondary | ICD-10-CM | POA: Insufficient documentation

## 2014-06-26 DIAGNOSIS — F329 Major depressive disorder, single episode, unspecified: Secondary | ICD-10-CM | POA: Diagnosis not present

## 2014-06-26 DIAGNOSIS — Z8739 Personal history of other diseases of the musculoskeletal system and connective tissue: Secondary | ICD-10-CM | POA: Insufficient documentation

## 2014-06-26 DIAGNOSIS — Y998 Other external cause status: Secondary | ICD-10-CM | POA: Insufficient documentation

## 2014-06-26 DIAGNOSIS — W01198A Fall on same level from slipping, tripping and stumbling with subsequent striking against other object, initial encounter: Secondary | ICD-10-CM | POA: Insufficient documentation

## 2014-06-26 DIAGNOSIS — Z720 Tobacco use: Secondary | ICD-10-CM | POA: Diagnosis not present

## 2014-06-26 DIAGNOSIS — F419 Anxiety disorder, unspecified: Secondary | ICD-10-CM | POA: Insufficient documentation

## 2014-06-26 DIAGNOSIS — I252 Old myocardial infarction: Secondary | ICD-10-CM | POA: Insufficient documentation

## 2014-06-26 DIAGNOSIS — Z86018 Personal history of other benign neoplasm: Secondary | ICD-10-CM | POA: Diagnosis not present

## 2014-06-26 DIAGNOSIS — Z880 Allergy status to penicillin: Secondary | ICD-10-CM | POA: Insufficient documentation

## 2014-06-26 DIAGNOSIS — I10 Essential (primary) hypertension: Secondary | ICD-10-CM | POA: Diagnosis not present

## 2014-06-26 DIAGNOSIS — W19XXXA Unspecified fall, initial encounter: Secondary | ICD-10-CM

## 2014-06-26 DIAGNOSIS — Y929 Unspecified place or not applicable: Secondary | ICD-10-CM | POA: Insufficient documentation

## 2014-06-26 DIAGNOSIS — E669 Obesity, unspecified: Secondary | ICD-10-CM | POA: Insufficient documentation

## 2014-06-26 DIAGNOSIS — Z79899 Other long term (current) drug therapy: Secondary | ICD-10-CM | POA: Insufficient documentation

## 2014-06-26 DIAGNOSIS — J449 Chronic obstructive pulmonary disease, unspecified: Secondary | ICD-10-CM | POA: Insufficient documentation

## 2014-06-26 DIAGNOSIS — S0101XA Laceration without foreign body of scalp, initial encounter: Secondary | ICD-10-CM

## 2014-06-26 DIAGNOSIS — G40909 Epilepsy, unspecified, not intractable, without status epilepticus: Secondary | ICD-10-CM | POA: Insufficient documentation

## 2014-06-26 MED ORDER — LIDOCAINE-EPINEPHRINE-TETRACAINE (LET) SOLUTION
3.0000 mL | Freq: Once | NASAL | Status: AC
  Administered 2014-06-26: 3 mL via TOPICAL
  Filled 2014-06-26: qty 3

## 2014-06-26 MED ORDER — FENTANYL CITRATE (PF) 100 MCG/2ML IJ SOLN
100.0000 ug | Freq: Once | INTRAMUSCULAR | Status: AC
  Administered 2014-06-26: 100 ug via NASAL
  Filled 2014-06-26: qty 2

## 2014-06-26 NOTE — ED Notes (Signed)
Patient has laceration about 1cm to the back of the head.

## 2014-06-26 NOTE — ED Provider Notes (Signed)
CSN: [id number]     Arrival date & time 06/26/14  2007 History   First MD Initiated Contact with Patient 06/26/14 2018     Chief Complaint  Patient presents with  . Fall     (Consider location/radiation/quality/duration/timing/severity/associated sxs/prior Treatment) HPI Comments: Patient is a 64 year old male past medical history significant for hyperlipidemia, RA, hypertension, GERD, vertigo, fibromyalgia, anemia, bipolar 1 disorder, OCD, psychosis, paroxysmal A. fib, DM, conversion disorder presenting to the emergency department after a mechanical fall. Patient states he went to sit in his seat, the chair broke he fell landing on his back, hit his head on the corner of the wall. He denies any loss of consciousness. Denies any presenting chest pain, shortness of breath, headache. He is also complaining of left shoulder pain. No modifying factors identified. Patient states he "lives at a daily 50 out of 10 pain scale." Patient is not on any blood thinners.    Past Medical History  Diagnosis Date  . Rheumatoid arthritis(714.0)   . Obesity   . Major depression     with acute psychotic break in 06/2010  . Hypertension   . Hyperlipidemia   . Diverticulosis of colon   . COPD (chronic obstructive pulmonary disease)   . Anxiety   . GERD (gastroesophageal reflux disease)   . Vertigo   . Fibromyalgia   . Dermatomyositis   . Myocardial infarct     mulitple (1999, 2000, 2004)  . Raynaud's disease   . Narcotic dependence   . Peripheral neuropathy   . Internal hemorrhoids   . Ischemic heart disease   . Hiatal hernia   . Gastritis   . Diverticulitis   . Hx of adenomatous colonic polyps   . Nephrolithiasis   . Anemia   . Esophageal stricture   . Esophageal dysmotility   . Dermatomyositis   . Urge incontinence   . Otosclerosis   . Bipolar 1 disorder   . OCD (obsessive compulsive disorder)   . Sarcoidosis   . Paroxysmal a-fib   . Dysrhythmia     "irregular" (11/15/2012)  . Type II  diabetes mellitus   . Seizures   . Headache(784.0)     "severe; get them often" (11/15/2012)  . Subarachnoid hemorrhage 01/2012    with subdural  hematoma.   . Atrial fibrillation 01/2012    with RVR  . Conversion disorder 06/2010  . History of narcotic addiction    Past Surgical History  Procedure Laterality Date  . Knee arthroscopy w/ meniscal repair Left 2009  . Lumbar disc surgery    . Squamous papilloma   2010    removed by Dr. Constance Holster ENT, noted on tongue  . Esophagogastroduodenoscopy N/A 09/27/2012    Procedure: ESOPHAGOGASTRODUODENOSCOPY (EGD);  Surgeon: Lafayette Dragon, MD;  Location: [facility];  Service: Endoscopy;  Laterality: N/A;  . Colonoscopy N/A 09/27/2012    Procedure: COLONOSCOPY;  Surgeon: Lafayette Dragon, MD;  Location: [facility];  Service: Endoscopy;  Laterality: N/A;  . Cataract extraction w/ intraocular lens implant Left   . Lymph node dissection Right 1970's    "neck; dr thought I had Hodgkins; turned out to be sarcoidosis" (11/15/2012)  . Tonsillectomy    . Carpal tunnel release Bilateral   . Cardiac catheterization    . Back surgery     Family History  Problem Relation Age of Onset  . Alcohol abuse Mother   . Depression Mother   . Heart disease Mother   . Diabetes Mother   .  Stroke Mother   . Diabetes Other     1/2 brother  . Hepatitis Brother    History  Substance Use Topics  . Smoking status: Current Every Day Smoker -- 0.50 packs/day for 30 years    Types: Cigarettes  . Smokeless tobacco: Never Used     Comment: 3 cigs a day  . Alcohol Use: No     Comment: Alcohol stopped in September of 2014    Review of Systems  Musculoskeletal: Positive for arthralgias.  Neurological: Positive for headaches.  All other systems reviewed and are negative.     Allergies  Immune globulins; Ciprofloxacin; Flagyl; Lisinopril; Sulfa antibiotics; Betamethasone dipropionate; Bupropion hcl; Clobetasol; Codeine; Escitalopram oxalate; Fluoxetine hcl;  Fluticasone-salmeterol; Furosemide; Paroxetine; Penicillins; Tacrolimus; Tetanus toxoid; and Tuberculin purified protein derivative  Home Medications   Prior to Admission medications   Medication Sig Start Date End Date Taking? Authorizing Provider  ALPRAZolam Duanne Moron) 1 MG tablet Take 1 tablet (1 mg total) by mouth 2 (two) times daily as needed for anxiety. 04/23/14  Yes Renee A Kuneff, DO  amiodarone (PACERONE) 200 MG tablet TAKE 1 TABLET BY MOUTH EVERY DAY WITH BREAKFAST 03/07/14  Yes Renee A Kuneff, DO  atorvastatin (LIPITOR) 40 MG tablet TAKE 1 TABLET BY MOUTH DAILY 04/29/14  Yes Renee A Kuneff, DO  Ipratropium-Albuterol (COMBIVENT) 20-100 MCG/ACT AERS respimat Inhale 1 puff into the lungs daily.   Yes Historical Provider, MD  levETIRAcetam (KEPPRA) 500 MG tablet TAKE 1 TABLET BY MOUTH TWICE DAILY 01/21/14  Yes Renee A Kuneff, DO  Multiple Vitamin (MULTIVITAMIN WITH MINERALS) TABS tablet Take 1 tablet by mouth daily. 12/29/12  Yes Kandis Nab, MD  NEXIUM 40 MG capsule TAKE ONE CAPSULE BY MOUTH EVERY MORNING WITH BREAKFAST 03/07/14  Yes Renee A Kuneff, DO  oxybutynin (DITROPAN) 5 MG tablet TAKE 1 TABLET BY MOUTH THREE TIMES DAILY 10/18/13  Yes Renee A Kuneff, DO  polyethylene glycol powder (GLYCOLAX/MIRALAX) powder Take 17 g by mouth daily. 06/25/14  Yes Renee A Kuneff, DO  potassium chloride SA (K-DUR,KLOR-CON) 20 MEQ tablet TAKE 1 TABLET BY MOUTH EVERY DAY 04/18/14  Yes Jayce G Cook, DO  rOPINIRole (REQUIP) 2 MG tablet TAKE 1 TABLET BY MOUTH THREE TIMES DAILY 03/07/14  Yes Renee A Kuneff, DO  albuterol-ipratropium (COMBIVENT) 18-103 MCG/ACT inhaler Inhale 1 puff into the lungs daily. 06/20/13   Gerda Diss, DO  divalproex (DEPAKOTE ER) 500 MG 24 hr tablet Take 1 tablet (500 mg total) by mouth daily. 06/25/14   Renee A Kuneff, DO  ondansetron (ZOFRAN ODT) 4 MG disintegrating tablet 4mg  ODT q4 hours prn nausea/vomit Patient not taking: Reported on 03/29/2014 10/09/13   Elnora Morrison, MD   BP 138/69  mmHg  Pulse 70  Temp(Src) 98.3 F (36.8 C) (Oral)  Resp 19  SpO2 96% Physical Exam  Constitutional: He is oriented to person, place, and time. He appears well-developed and well-nourished. No distress.  HENT:  Head: Normocephalic and atraumatic.    Right Ear: External ear normal.  Left Ear: External ear normal.  Nose: Nose normal.  Mouth/Throat: Oropharynx is clear and moist. No oropharyngeal exudate.  Eyes: Conjunctivae and EOM are normal. Pupils are equal, round, and reactive to light.  Neck: Normal range of motion. Neck supple.  Cardiovascular: Normal rate, regular rhythm, normal heart sounds and intact distal pulses.   Pulmonary/Chest: Effort normal and breath sounds normal. No respiratory distress.  Abdominal: Soft. There is no tenderness.  Neurological: He is alert and oriented to  person, place, and time. He has normal strength. No cranial nerve deficit. Gait normal. GCS eye subscore is 4. GCS verbal subscore is 5. GCS motor subscore is 6.  Sensation grossly intact.  No pronator drift.  Bilateral heel-knee-shin intact.  Skin: Skin is warm and dry. He is not diaphoretic.  Nursing note and vitals reviewed.   ED Course  Procedures (including critical care time) Medications  fentaNYL (SUBLIMAZE) injection 100 mcg (100 mcg Nasal Given 06/26/14 2103)  lidocaine-EPINEPHrine-tetracaine (LET) solution (3 mLs Topical Given 06/26/14 2103)    Labs Review Labs Reviewed - No data to display  Imaging Review Ct Head Wo Contrast  06/26/2014   CLINICAL DATA:  Chair collapse, patient fell striking back of head against wall. Laceration along scalp.  EXAM: CT HEAD WITHOUT CONTRAST  CT CERVICAL SPINE WITHOUT CONTRAST  TECHNIQUE: Multidetector CT imaging of the head and cervical spine was performed following the standard protocol without intravenous contrast. Multiplanar CT image reconstructions of the cervical spine were also generated.  COMPARISON:  Multiple exams, including 03/06/2013   FINDINGS: CT HEAD FINDINGS  The brainstem, cerebellum, cerebral peduncles, thalamus, basal ganglia, basilar cisterns, and ventricular system appear within normal limits. Periventricular white matter and corona radiata hypodensities favor chronic ischemic microvascular white matter disease. No intracranial hemorrhage, mass lesion, or acute CVA. Posterior scalp soft tissue swelling just to the right of midline.  Chronic ethmoid sinusitis. Mild chronic right frontal sinusitis. There is atherosclerotic calcification of the cavernous carotid arteries bilaterally.  CT CERVICAL SPINE FINDINGS  Anterior C1-2 articulation spurring noted. Well corticated separation of the posterior portion of spinous process of T1, potentially from old fracture or failure of fusion. This is unchanged from 01/05/2014.  No cervical spine fracture or subluxation. No prevertebral soft tissue swelling.  IMPRESSION: 1. No acute intracranial or cervical spine findings. 2. Periventricular white matter and corona radiata hypodensities favor chronic ischemic microvascular white matter disease. 3. Chronic frontal and ethmoid sinusitis.   Electronically Signed   By: Van Clines M.D.   On: 06/26/2014 23:09   Ct Cervical Spine Wo Contrast  06/26/2014   CLINICAL DATA:  Chair collapse, patient fell striking back of head against wall. Laceration along scalp.  EXAM: CT HEAD WITHOUT CONTRAST  CT CERVICAL SPINE WITHOUT CONTRAST  TECHNIQUE: Multidetector CT imaging of the head and cervical spine was performed following the standard protocol without intravenous contrast. Multiplanar CT image reconstructions of the cervical spine were also generated.  COMPARISON:  Multiple exams, including 03/06/2013  FINDINGS: CT HEAD FINDINGS  The brainstem, cerebellum, cerebral peduncles, thalamus, basal ganglia, basilar cisterns, and ventricular system appear within normal limits. Periventricular white matter and corona radiata hypodensities favor chronic ischemic  microvascular white matter disease. No intracranial hemorrhage, mass lesion, or acute CVA. Posterior scalp soft tissue swelling just to the right of midline.  Chronic ethmoid sinusitis. Mild chronic right frontal sinusitis. There is atherosclerotic calcification of the cavernous carotid arteries bilaterally.  CT CERVICAL SPINE FINDINGS  Anterior C1-2 articulation spurring noted. Well corticated separation of the posterior portion of spinous process of T1, potentially from old fracture or failure of fusion. This is unchanged from 01/05/2014.  No cervical spine fracture or subluxation. No prevertebral soft tissue swelling.  IMPRESSION: 1. No acute intracranial or cervical spine findings. 2. Periventricular white matter and corona radiata hypodensities favor chronic ischemic microvascular white matter disease. 3. Chronic frontal and ethmoid sinusitis.   Electronically Signed   By: Van Clines M.D.   On: 06/26/2014 23:09  Dg Shoulder Left  06/26/2014   CLINICAL DATA:  Fall out of a chair, striking head on a wall. Left shoulder pain.  EXAM: LEFT SHOULDER - 2+ VIEW  COMPARISON:  None.  FINDINGS: Laterally downsloping acromion may predispose to impingement. AC joint intact. No glenohumeral malalignment. No shoulder fracture identified.  IMPRESSION: 1. No acute findings. 2. Laterally downsloping acromion may predispose to impingement.   Electronically Signed   By: Van Clines M.D.   On: 06/26/2014 22:07     EKG Interpretation None      LACERATION REPAIR Performed by: Harlow Mares Authorized by: Harlow Mares Consent: Verbal consent obtained. Risks and benefits: risks, benefits and alternatives were discussed Consent given by: patient Patient identity confirmed: provided demographic data Prepped and Draped in normal sterile fashion Wound explored  Laceration Location: scalp  Laceration Length: 1cm  No Foreign Bodies seen or palpated  Anesthesia: topical  Local  anesthetic: LET  Anesthetic total: 3 ml  Irrigation method: syringe Amount of cleaning: standard  Skin closure: staples  Number of sutures: 1  Technique: staples  Patient tolerance: Patient tolerated the procedure well with no immediate complications.  MDM   Final diagnoses:  Scalp laceration, initial encounter  Fall, initial encounter    Filed Vitals:   06/26/14 2334  BP:   Pulse:   Temp: 98.3 F (36.8 C)  Resp:    I have reviewed nursing notes, vital signs, and all appropriate lab and imaging results if ordered as above.   Afebrile, NAD, non-toxic appearing, AAOx4. Patient presenting with mechanical fall. Laceration to posterior scalp noted. Bleeding controlled. No neurofocal deficits on examination. CT scans reviewed. X-ray reviewed. No acute findings on imaging. Wound cleansed and closed with staples. Tdap UTD. Bottom of wound visualized, no foreign bodies appreciated. Laceration occurred < 8 hours prior to repair which was well tolerated. Pt has no co morbidities to effect normal wound healing. Discussed suture home care w pt and answered questions. Pt to f-u for wound check and staple removal in 5 days. Pt is hemodynamically stable w no complaints prior to dc. Patient is stable at time of discharge. Patient d/w with Dr. Vanita Panda, agrees with plan.         Baron Sane, PA-C 06/27/14 0049  Carmin Muskrat, MD 06/27/14 2135

## 2014-06-26 NOTE — ED Notes (Signed)
Patient comes from home; [place] from Sitting position when chair broke from under him. Patient hit head on conner on wall Denies any Blood thinner but does take 81mg  ASA. Alert & O on arrival.

## 2014-06-26 NOTE — Discharge Instructions (Signed)
Please follow up with your primary care physician in 1-2 days. If you do not have one please call the [facility] number listed above. Please have your primary care doctor take your staples out in 5 days. Please read all discharge instructions and return precautions.   Staple Care and Removal Your caregiver has used staples today to repair your wound. Staples are used to help a wound heal faster by holding the edges of the wound together. The staples can be removed when the wound has healed well enough to stay together after the staples are removed. A dressing (wound covering), depending on the location of the wound, may have been applied. This may be changed once per day or as instructed. If the dressing sticks, it may be soaked off with soapy water or hydrogen peroxide. Only take over-the-counter or prescription medicines for pain, discomfort, or fever as directed by your caregiver.  If you did not receive a tetanus shot today because you did not recall when your last one was given, check with your caregiver when you have your staples removed to determine if one is needed. Return to your caregiver's office in 1 week or as suggested to have your staples removed. SEEK IMMEDIATE MEDICAL CARE IF:   You have redness, swelling, or increasing pain in the wound.  You have pus coming from the wound.  You have a fever.  You notice a bad smell coming from the wound or dressing.  Your wound edges break open after staples have been removed. Document Released: 10/27/2000 Document Revised: 04/26/2011 Document Reviewed: 11/11/2004 [facility] Patient Information 2015 [place], [place]. This information is not intended to replace advice given to you by your health care provider. Make sure you discuss any questions you have with your health care provider.

## 2014-06-27 ENCOUNTER — Emergency Department (HOSPITAL_COMMUNITY)
Admission: EM | Admit: 2014-06-27 | Discharge: 2014-06-28 | Disposition: A | Discharge status: Home / Self Care | Payer: Commercial Managed Care - HMO | Attending: Emergency Medicine | Admitting: Emergency Medicine

## 2014-06-27 DIAGNOSIS — Z720 Tobacco use: Secondary | ICD-10-CM | POA: Diagnosis not present

## 2014-06-27 DIAGNOSIS — J449 Chronic obstructive pulmonary disease, unspecified: Secondary | ICD-10-CM | POA: Insufficient documentation

## 2014-06-27 DIAGNOSIS — Y92009 Unspecified place in unspecified non-institutional (private) residence as the place of occurrence of the external cause: Secondary | ICD-10-CM | POA: Diagnosis not present

## 2014-06-27 DIAGNOSIS — I10 Essential (primary) hypertension: Secondary | ICD-10-CM | POA: Insufficient documentation

## 2014-06-27 DIAGNOSIS — S0101XA Laceration without foreign body of scalp, initial encounter: Secondary | ICD-10-CM | POA: Diagnosis not present

## 2014-06-27 DIAGNOSIS — Y999 Unspecified external cause status: Secondary | ICD-10-CM | POA: Diagnosis not present

## 2014-06-27 DIAGNOSIS — Z86018 Personal history of other benign neoplasm: Secondary | ICD-10-CM | POA: Insufficient documentation

## 2014-06-27 DIAGNOSIS — Z880 Allergy status to penicillin: Secondary | ICD-10-CM | POA: Diagnosis not present

## 2014-06-27 DIAGNOSIS — S0990XA Unspecified injury of head, initial encounter: Secondary | ICD-10-CM | POA: Diagnosis present

## 2014-06-27 DIAGNOSIS — Y939 Activity, unspecified: Secondary | ICD-10-CM | POA: Insufficient documentation

## 2014-06-27 DIAGNOSIS — H547 Unspecified visual loss: Secondary | ICD-10-CM | POA: Insufficient documentation

## 2014-06-27 DIAGNOSIS — E785 Hyperlipidemia, unspecified: Secondary | ICD-10-CM | POA: Diagnosis not present

## 2014-06-27 DIAGNOSIS — E669 Obesity, unspecified: Secondary | ICD-10-CM | POA: Diagnosis not present

## 2014-06-27 DIAGNOSIS — Z8719 Personal history of other diseases of the digestive system: Secondary | ICD-10-CM | POA: Diagnosis not present

## 2014-06-27 DIAGNOSIS — Z8739 Personal history of other diseases of the musculoskeletal system and connective tissue: Secondary | ICD-10-CM | POA: Insufficient documentation

## 2014-06-27 DIAGNOSIS — E119 Type 2 diabetes mellitus without complications: Secondary | ICD-10-CM | POA: Diagnosis not present

## 2014-06-27 DIAGNOSIS — F419 Anxiety disorder, unspecified: Secondary | ICD-10-CM | POA: Diagnosis not present

## 2014-06-27 DIAGNOSIS — G40909 Epilepsy, unspecified, not intractable, without status epilepticus: Secondary | ICD-10-CM | POA: Diagnosis not present

## 2014-06-27 DIAGNOSIS — I252 Old myocardial infarction: Secondary | ICD-10-CM | POA: Diagnosis not present

## 2014-06-27 DIAGNOSIS — Z862 Personal history of diseases of the blood and blood-forming organs and certain disorders involving the immune mechanism: Secondary | ICD-10-CM | POA: Insufficient documentation

## 2014-06-27 DIAGNOSIS — F329 Major depressive disorder, single episode, unspecified: Secondary | ICD-10-CM | POA: Diagnosis not present

## 2014-06-27 DIAGNOSIS — Z79899 Other long term (current) drug therapy: Secondary | ICD-10-CM | POA: Insufficient documentation
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
## 2014-06-28 ENCOUNTER — Emergency Department (HOSPITAL_COMMUNITY): Payer: Commercial Managed Care - HMO

## 2014-06-28 ENCOUNTER — Encounter (HOSPITAL_COMMUNITY): Payer: Self-pay | Admitting: Emergency Medicine

## 2014-06-28 LAB — COMPREHENSIVE METABOLIC PANEL
ALT: 17 U/L (ref 17–63)
AST: 21 U/L (ref 15–41)
Albumin: 3.4 g/dL — ABNORMAL LOW (ref 3.5–5.0)
Alkaline Phosphatase: 70 U/L (ref 38–126)
Anion gap: 10 (ref 5–15)
BUN: 6 mg/dL (ref 6–20)
CHLORIDE: 106 mmol/L (ref 101–111)
CO2: 24 mmol/L (ref 22–32)
Calcium: 8.7 mg/dL — ABNORMAL LOW (ref 8.9–10.3)
Creatinine, Ser: 1.06 mg/dL (ref 0.61–1.24)
Glucose, Bld: 117 mg/dL — ABNORMAL HIGH (ref 65–99)
Potassium: 3.8 mmol/L (ref 3.5–5.1)
SODIUM: 140 mmol/L (ref 135–145)
Total Bilirubin: 0.5 mg/dL (ref 0.3–1.2)
Total Protein: 6.4 g/dL — ABNORMAL LOW (ref 6.5–8.1)

## 2014-06-28 LAB — CBC WITH DIFFERENTIAL/PLATELET
BASOS ABS: 0 10*3/uL (ref 0.0–0.1)
Basophils Relative: 0 % (ref 0–1)
Eosinophils Absolute: 0.3 10*3/uL (ref 0.0–0.7)
Eosinophils Relative: 5 % (ref 0–5)
HCT: 44 % (ref 39.0–52.0)
Hemoglobin: 14.3 g/dL (ref 13.0–17.0)
LYMPHS PCT: 28 % (ref 12–46)
Lymphs Abs: 1.8 10*3/uL (ref 0.7–4.0)
MCH: 29.1 pg (ref 26.0–34.0)
MCHC: 32.5 g/dL (ref 30.0–36.0)
MCV: 89.6 fL (ref 78.0–100.0)
MONO ABS: 0.6 10*3/uL (ref 0.1–1.0)
Monocytes Relative: 10 % (ref 3–12)
NEUTROS ABS: 3.8 10*3/uL (ref 1.7–7.7)
NEUTROS PCT: 57 % (ref 43–77)
PLATELETS: 233 10*3/uL (ref 150–400)
RBC: 4.91 MIL/uL (ref 4.22–5.81)
RDW: 14.5 % (ref 11.5–15.5)
WBC: 6.6 10*3/uL (ref 4.0–10.5)

## 2014-06-28 LAB — APTT: aPTT: 31 seconds (ref 24–37)

## 2014-06-28 LAB — PROTIME-INR
INR: 0.97 (ref 0.00–1.49)
PROTHROMBIN TIME: 13 s (ref 11.6–15.2)

## 2014-06-28 NOTE — ED Notes (Signed)
Pt given taxi voucher

## 2014-06-28 NOTE — Discharge Instructions (Signed)
Concussion A concussion, or closed-head injury, is a brain injury caused by a direct blow to the head or by a quick and sudden movement (jolt) of the head or neck. Concussions are usually not life-threatening. Even so, the effects of a concussion can be serious. If you have had a concussion before, you are more likely to experience concussion-like symptoms after a direct blow to the head.  CAUSES  Direct blow to the head, such as from running into another player during a soccer game, being hit in a fight, or hitting your head on a hard surface.  A jolt of the head or neck that causes the brain to move back and forth inside the skull, such as in a car crash. SIGNS AND SYMPTOMS The signs of a concussion can be hard to notice. Early on, they may be missed by you, family members, and health care providers. You may look fine but act or feel differently. Symptoms are usually temporary, but they may last for days, weeks, or even longer. Some symptoms may appear right away while others may not show up for hours or days. Every head injury is different. Symptoms include:  Mild to moderate headaches that will not go away.  A feeling of pressure inside your head.  Having more trouble than usual:  Learning or remembering things you have heard.  Answering questions.  Paying attention or concentrating.  Organizing daily tasks.  Making decisions and solving problems.  Slowness in thinking, acting or reacting, speaking, or reading.  Getting lost or being easily confused.  Feeling tired all the time or lacking energy (fatigued).  Feeling drowsy.  Sleep disturbances.  Sleeping more than usual.  Sleeping less than usual.  Trouble falling asleep.  Trouble sleeping (insomnia).  Loss of balance or feeling lightheaded or dizzy.  Nausea or vomiting.  Numbness or tingling.  Increased sensitivity to:  Sounds.  Lights.  Distractions.  Vision problems or eyes that tire  easily.  Diminished sense of taste or smell.  Ringing in the ears.  Mood changes such as feeling sad or anxious.  Becoming easily irritated or angry for little or no reason.  Lack of motivation.  Seeing or hearing things other people do not see or hear (hallucinations). DIAGNOSIS Your health care provider can usually diagnose a concussion based on a description of your injury and symptoms. He or she will ask whether you passed out (lost consciousness) and whether you are having trouble remembering events that happened right before and during your injury. Your evaluation might include:  A brain scan to look for signs of injury to the brain. Even if the test shows no injury, you may still have a concussion.  Blood tests to be sure other problems are not present. TREATMENT  Concussions are usually treated in an emergency department, in urgent care, or at a clinic. You may need to stay in the hospital overnight for further treatment.  Tell your health care provider if you are taking any medicines, including prescription medicines, over-the-counter medicines, and natural remedies. Some medicines, such as blood thinners (anticoagulants) and aspirin, may increase the chance of complications. Also tell your health care provider whether you have had alcohol or are taking illegal drugs. This information may affect treatment.  Your health care provider will send you home with important instructions to follow.  How fast you will recover from a concussion depends on many factors. These factors include how severe your concussion is, what part of your brain was injured, your  may affect treatment.  · Your health care provider will send you home with important instructions to follow.  · How fast you will recover from a concussion depends on many factors. These factors include how severe your concussion is, what part of your brain was injured, your age, and how healthy you were before the concussion.  · Most people with mild injuries recover fully. Recovery can take time. In general, recovery is slower in older persons. Also, persons who have had a concussion in the past or have other medical problems may find that it takes longer to recover from their current injury.  HOME  CARE INSTRUCTIONS  General Instructions  · Carefully follow the directions your health care provider gave you.  · Only take over-the-counter or prescription medicines for pain, discomfort, or fever as directed by your health care provider.  · Take only those medicines that your health care provider has approved.  · Do not drink alcohol until your health care provider says you are well enough to do so. Alcohol and certain other drugs may slow your recovery and can put you at risk of further injury.  · If it is harder than usual to remember things, write them down.  · If you are easily distracted, try to do one thing at a time. For example, do not try to watch TV while fixing dinner.  · Talk with family members or close friends when making important decisions.  · Keep all follow-up appointments. Repeated evaluation of your symptoms is recommended for your recovery.  · Watch your symptoms and tell others to do the same. Complications sometimes occur after a concussion. Older adults with a brain injury may have a higher risk of serious complications, such as a blood clot on the brain.  · Tell your teachers, school nurse, school counselor, coach, athletic trainer, or work manager about your injury, symptoms, and restrictions. Tell them about what you can or cannot do. They should watch for:  · Increased problems with attention or concentration.  · Increased difficulty remembering or learning new information.  · Increased time needed to complete tasks or assignments.  · Increased irritability or decreased ability to cope with stress.  · Increased symptoms.  · Rest. Rest helps the brain to heal. Make sure you:  · Get plenty of sleep at night. Avoid staying up late at night.  · Keep the same bedtime hours on weekends and weekdays.  · Rest during the day. Take daytime naps or rest breaks when you feel tired.  · Limit activities that require a lot of thought or concentration. These include:  · Doing homework or job-related  work.  · Watching TV.  · Working on the computer.  · Avoid any situation where there is potential for another head injury (football, hockey, soccer, basketball, martial arts, downhill snow sports and horseback riding). Your condition will get worse every time you experience a concussion. You should avoid these activities until you are evaluated by the appropriate follow-up health care providers.  Returning To Your Regular Activities  You will need to return to your normal activities slowly, not all at once. You must give your body and brain enough time for recovery.  · Do not return to sports or other athletic activities until your health care provider tells you it is safe to do so.  · Ask your health care provider when you can drive, ride a bicycle, or operate heavy machinery. Your ability to react may be slower after a   belt when riding in a car.  Drinking alcohol only in moderation.  Wearing a helmet when biking, skiing, skateboarding, skating, or doing similar activities.  Avoiding activities that could lead to a second concussion, such as contact or recreational sports, until your health care provider says it is okay.  Taking safety measures in your home.  Remove clutter and tripping hazards from floors and stairways.  Use grab bars in bathrooms and handrails by stairs.  Place non-slip mats on floors and in bathtubs.  Improve lighting in dim areas. SEEK MEDICAL CARE IF:  You have increased problems paying attention or  concentrating.  You have increased difficulty remembering or learning new information.  You need more time to complete tasks or assignments than before.  You have increased irritability or decreased ability to cope with stress.  You have more symptoms than before. Seek medical care if you have any of the following symptoms for more than 2 weeks after your injury:  Lasting (chronic) headaches.  Dizziness or balance problems.  Nausea.  Vision problems.  Increased sensitivity to noise or light.  Depression or mood swings.  Anxiety or irritability.  Memory problems.  Difficulty concentrating or paying attention.  Sleep problems.  Feeling tired all the time. SEEK IMMEDIATE MEDICAL CARE IF:  You have severe or worsening headaches. These may be a sign of a blood clot in the brain.  You have weakness (even if only in one hand, leg, or part of the face).  You have numbness.  You have decreased coordination.  You vomit repeatedly.  You have increased sleepiness.  One pupil is larger than the other.  You have convulsions.  You have slurred speech.  You have increased confusion. This may be a sign of a blood clot in the brain.  You have increased restlessness, agitation, or irritability.  You are unable to recognize people or places.  You have neck pain.  It is difficult to wake you up.  You have unusual behavior changes.  You lose consciousness. MAKE SURE YOU:  Understand these instructions.  Will watch your condition.  Will get help right away if you are not doing well or get worse. Document Released: 04/24/2003 Document Revised: 02/06/2013 Document Reviewed: 08/24/2012 [facility] Patient Information 2015 [place], [place]. This information is not intended to replace advice given to you by your health care provider. Make sure you discuss any questions you have with your health care provider.  Laceration Care, Adult A laceration is a cut or lesion that  goes through all layers of the skin and into the tissue just beneath the skin. TREATMENT  Some lacerations may not require closure. Some lacerations may not be able to be closed due to an increased risk of infection. It is important to see your caregiver as soon as possible after an injury to minimize the risk of infection and maximize the opportunity for successful closure. If closure is appropriate, pain medicines may be given, if needed. The wound will be cleaned to help prevent infection. Your caregiver will use stitches (sutures), staples, wound glue (adhesive), or skin adhesive strips to repair the laceration. These tools bring the skin edges together to allow for faster healing and a better cosmetic outcome. However, all wounds will heal with a scar. Once the wound has healed, scarring can be minimized by covering the wound with sunscreen during the day for 1 full year. HOME CARE INSTRUCTIONS  For sutures or staples:  Keep the wound clean and dry.  If you were  given a bandage (dressing), you should change it at least once a day. Also, change the dressing if it becomes wet or dirty, or as directed by your caregiver.  Wash the wound with soap and water 2 times a day. Rinse the wound off with water to remove all soap. Pat the wound dry with a clean towel.  After cleaning, apply a thin layer of the antibiotic ointment as recommended by your caregiver. This will help prevent infection and keep the dressing from sticking.  You may shower as usual after the first 24 hours. Do not soak the wound in water until the sutures are removed.  Only take over-the-counter or prescription medicines for pain, discomfort, or fever as directed by your caregiver.  Get your sutures or staples removed as directed by your caregiver. For skin adhesive strips:  Keep the wound clean and dry.  Do not get the skin adhesive strips wet. You may bathe carefully, using caution to keep the wound dry.  If the wound gets  wet, pat it dry with a clean towel.  Skin adhesive strips will fall off on their own. You may trim the strips as the wound heals. Do not remove skin adhesive strips that are still stuck to the wound. They will fall off in time. For wound adhesive:  You may briefly wet your wound in the shower or bath. Do not soak or scrub the wound. Do not swim. Avoid periods of heavy perspiration until the skin adhesive has fallen off on its own. After showering or bathing, gently pat the wound dry with a clean towel.  Do not apply liquid medicine, cream medicine, or ointment medicine to your wound while the skin adhesive is in place. This may loosen the film before your wound is healed.  If a dressing is placed over the wound, be careful not to apply tape directly over the skin adhesive. This may cause the adhesive to be pulled off before the wound is healed.  Avoid prolonged exposure to sunlight or tanning lamps while the skin adhesive is in place. Exposure to ultraviolet light in the first year will darken the scar.  The skin adhesive will usually remain in place for 5 to 10 days, then naturally fall off the skin. Do not pick at the adhesive film. You may need a tetanus shot if:  You cannot remember when you had your last tetanus shot.  You have never had a tetanus shot. If you get a tetanus shot, your arm may swell, get red, and feel warm to the touch. This is common and not a problem. If you need a tetanus shot and you choose not to have one, there is a rare chance of getting tetanus. Sickness from tetanus can be serious. SEEK MEDICAL CARE IF:   You have redness, swelling, or increasing pain in the wound.  You see a red line that goes away from the wound.  You have yellowish-white fluid (pus) coming from the wound.  You have a fever.  You notice a bad smell coming from the wound or dressing.  Your wound breaks open before or after sutures have been removed.  You notice something coming out of  the wound such as wood or glass.  Your wound is on your hand or foot and you cannot move a finger or toe. SEEK IMMEDIATE MEDICAL CARE IF:   Your pain is not controlled with prescribed medicine.  You have severe swelling around the wound causing pain and numbness or a  change in color in your arm, hand, leg, or foot.  Your wound splits open and starts bleeding.  You have worsening numbness, weakness, or loss of function of any joint around or beyond the wound.  You develop painful lumps near the wound or on the skin anywhere on your body. MAKE SURE YOU:   Understand these instructions.  Will watch your condition.  Will get help right away if you are not doing well or get worse. Document Released: 02/01/2005 Document Revised: 04/26/2011 Document Reviewed: 07/28/2010 [facility] Patient Information 2015 [place], [place]. This information is not intended to replace advice given to you by your health care provider. Make sure you discuss any questions you have with your health care provider.
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
## 2014-06-30 ENCOUNTER — Encounter (HOSPITAL_COMMUNITY): Payer: Self-pay | Admitting: Emergency Medicine

## 2014-06-30 ENCOUNTER — Emergency Department (HOSPITAL_COMMUNITY)
Admission: EM | Admit: 2014-06-30 | Discharge: 2014-07-01 | Disposition: A | Discharge status: Home / Self Care | Payer: Commercial Managed Care - HMO | Attending: Emergency Medicine | Admitting: Emergency Medicine

## 2014-06-30 DIAGNOSIS — E119 Type 2 diabetes mellitus without complications: Secondary | ICD-10-CM | POA: Diagnosis not present

## 2014-06-30 DIAGNOSIS — F419 Anxiety disorder, unspecified: Secondary | ICD-10-CM | POA: Diagnosis not present

## 2014-06-30 DIAGNOSIS — E669 Obesity, unspecified: Secondary | ICD-10-CM | POA: Diagnosis not present

## 2014-06-30 DIAGNOSIS — Z720 Tobacco use: Secondary | ICD-10-CM | POA: Insufficient documentation

## 2014-06-30 DIAGNOSIS — Z862 Personal history of diseases of the blood and blood-forming organs and certain disorders involving the immune mechanism: Secondary | ICD-10-CM | POA: Insufficient documentation

## 2014-06-30 DIAGNOSIS — Z880 Allergy status to penicillin: Secondary | ICD-10-CM | POA: Diagnosis not present

## 2014-06-30 DIAGNOSIS — I10 Essential (primary) hypertension: Secondary | ICD-10-CM | POA: Insufficient documentation

## 2014-06-30 DIAGNOSIS — Z87442 Personal history of urinary calculi: Secondary | ICD-10-CM | POA: Insufficient documentation

## 2014-06-30 DIAGNOSIS — Z8719 Personal history of other diseases of the digestive system: Secondary | ICD-10-CM | POA: Diagnosis not present

## 2014-06-30 DIAGNOSIS — Z8739 Personal history of other diseases of the musculoskeletal system and connective tissue: Secondary | ICD-10-CM | POA: Diagnosis not present

## 2014-06-30 DIAGNOSIS — Z79899 Other long term (current) drug therapy: Secondary | ICD-10-CM | POA: Diagnosis not present

## 2014-06-30 DIAGNOSIS — G40909 Epilepsy, unspecified, not intractable, without status epilepticus: Secondary | ICD-10-CM | POA: Insufficient documentation

## 2014-06-30 DIAGNOSIS — Z86018 Personal history of other benign neoplasm: Secondary | ICD-10-CM | POA: Diagnosis not present

## 2014-06-30 DIAGNOSIS — R079 Chest pain, unspecified: Secondary | ICD-10-CM | POA: Diagnosis not present

## 2014-06-30 DIAGNOSIS — I252 Old myocardial infarction: Secondary | ICD-10-CM | POA: Diagnosis not present

## 2014-06-30 DIAGNOSIS — J449 Chronic obstructive pulmonary disease, unspecified: Secondary | ICD-10-CM | POA: Diagnosis not present

## 2014-06-30 DIAGNOSIS — E785 Hyperlipidemia, unspecified: Secondary | ICD-10-CM | POA: Diagnosis not present

## 2014-06-30 DIAGNOSIS — F329 Major depressive disorder, single episode, unspecified: Secondary | ICD-10-CM | POA: Insufficient documentation

## 2014-06-30 LAB — CBC
HCT: 41.7 % (ref 39.0–52.0)
Hemoglobin: 13.3 g/dL (ref 13.0–17.0)
MCH: 28.4 pg (ref 26.0–34.0)
MCHC: 31.9 g/dL (ref 30.0–36.0)
MCV: 89.1 fL (ref 78.0–100.0)
PLATELETS: 253 10*3/uL (ref 150–400)
RBC: 4.68 MIL/uL (ref 4.22–5.81)
RDW: 14.7 % (ref 11.5–15.5)
WBC: 6.9 10*3/uL (ref 4.0–10.5)

## 2014-06-30 LAB — I-STAT TROPONIN, ED: TROPONIN I, POC: 0 ng/mL (ref 0.00–0.08)

## 2014-06-30 MED ORDER — LORAZEPAM 1 MG PO TABS
2.0000 mg | ORAL_TABLET | Freq: Once | ORAL | Status: AC
  Administered 2014-06-30: 2 mg via ORAL
  Filled 2014-06-30: qty 2
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
## 2014-07-01 LAB — BASIC METABOLIC PANEL
Anion gap: 8 (ref 5–15)
BUN: 6 mg/dL (ref 6–20)
CALCIUM: 8.8 mg/dL — AB (ref 8.9–10.3)
CO2: 28 mmol/L (ref 22–32)
CREATININE: 1.02 mg/dL (ref 0.61–1.24)
Chloride: 105 mmol/L (ref 101–111)
Glucose, Bld: 98 mg/dL (ref 65–99)
POTASSIUM: 3.7 mmol/L (ref 3.5–5.1)
Sodium: 141 mmol/L (ref 135–145)

## 2014-07-01 NOTE — Discharge Instructions (Signed)
Panic Attacks  Panic attacks are sudden, short-lived surges of severe anxiety, fear, or discomfort. They may occur for no reason when you are relaxed, when you are anxious, or when you are sleeping. Panic attacks may occur for a number of reasons:   · Healthy people occasionally have panic attacks in extreme, life-threatening situations, such as war or natural disasters. Normal anxiety is a protective mechanism of the body that helps us react to danger (fight or flight response).  · Panic attacks are often seen with anxiety disorders, such as panic disorder, social anxiety disorder, generalized anxiety disorder, and phobias. Anxiety disorders cause excessive or uncontrollable anxiety. They may interfere with your relationships or other life activities.  · Panic attacks are sometimes seen with other mental illnesses, such as depression and posttraumatic stress disorder.  · Certain medical conditions, prescription medicines, and drugs of abuse can cause panic attacks.  SYMPTOMS   Panic attacks start suddenly, peak within 20 minutes, and are accompanied by four or more of the following symptoms:  · Pounding heart or fast heart rate (palpitations).  · Sweating.  · Trembling or shaking.  · Shortness of breath or feeling smothered.  · Feeling choked.  · Chest pain or discomfort.  · Nausea or strange feeling in your stomach.  · Dizziness, light-headedness, or feeling like you will faint.  · Chills or hot flushes.  · Numbness or tingling in your lips or hands and feet.  · Feeling that things are not real or feeling that you are not yourself.  · Fear of losing control or going crazy.  · Fear of dying.  Some of these symptoms can mimic serious medical conditions. For example, you may think you are having a heart attack. Although panic attacks can be very scary, they are not life threatening.  DIAGNOSIS   Panic attacks are diagnosed through an assessment by your health care provider. Your health care provider will ask  questions about your symptoms, such as where and when they occurred. Your health care provider will also ask about your medical history and use of alcohol and drugs, including prescription medicines. Your health care provider may order blood tests or other studies to rule out a serious medical condition. Your health care provider may refer you to a mental health professional for further evaluation.  TREATMENT   · Most healthy people who have one or two panic attacks in an extreme, life-threatening situation will not require treatment.  · The treatment for panic attacks associated with anxiety disorders or other mental illness typically involves counseling with a mental health professional, medicine, or a combination of both. Your health care provider will help determine what treatment is best for you.  · Panic attacks due to physical illness usually go away with treatment of the illness. If prescription medicine is causing panic attacks, talk with your health care provider about stopping the medicine, decreasing the dose, or substituting another medicine.  · Panic attacks due to alcohol or drug abuse go away with abstinence. Some adults need professional help in order to stop drinking or using drugs.  HOME CARE INSTRUCTIONS   · Take all medicines as directed by your health care provider.    · Schedule and attend follow-up visits as directed by your health care provider. It is important to keep all your appointments.  SEEK MEDICAL CARE IF:  · You are not able to take your medicines as prescribed.  · Your symptoms do not improve or get worse.  SEEK IMMEDIATE MEDICAL   CARE IF:   · You experience panic attack symptoms that are different than your usual symptoms.  · You have serious thoughts about hurting yourself or others.  · You are taking medicine for panic attacks and have a serious side effect.  MAKE SURE YOU:  · Understand these instructions.  · Will watch your condition.  · Will get help right away if you are not  doing well or get worse.  Document Released: 02/01/2005 Document Revised: 02/06/2013 Document Reviewed: 09/15/2012  ExitCare® Patient Information ©2015 [employer]. This information is not intended to replace advice given to you by your health care provider. Make sure you discuss any questions you have with your health care provider.

## 2014-07-01 NOTE — ED Notes (Signed)
Pt. Left with all belongings 

## 2014-07-02 ENCOUNTER — Ambulatory Visit (INDEPENDENT_AMBULATORY_CARE_PROVIDER_SITE_OTHER): Payer: Commercial Managed Care - HMO | Admitting: Family Medicine

## 2014-07-02 ENCOUNTER — Encounter: Payer: Self-pay | Admitting: Family Medicine

## 2014-07-02 DIAGNOSIS — Z9181 History of falling: Secondary | ICD-10-CM | POA: Diagnosis not present

## 2014-07-02 DIAGNOSIS — Z09 Encounter for follow-up examination after completed treatment for conditions other than malignant neoplasm: Secondary | ICD-10-CM | POA: Diagnosis not present

## 2014-07-02 MED ORDER — ROPINIROLE HCL 2 MG PO TABS: 2.0000 mg | ORAL_TABLET | Freq: Three times a day (TID) | ORAL | Status: DC

## 2014-07-02 MED ORDER — AMIODARONE HCL 200 MG PO TABS: 200.0000 mg | ORAL_TABLET | Freq: Every day | ORAL | Status: DC

## 2014-07-02 MED ORDER — OXYBUTYNIN CHLORIDE 5 MG PO TABS: 5.0000 mg | ORAL_TABLET | Freq: Three times a day (TID) | ORAL | Status: DC

## 2014-07-02 MED ORDER — LEVETIRACETAM 500 MG PO TABS: 500.0000 mg | ORAL_TABLET | Freq: Two times a day (BID) | ORAL | Status: DC

## 2014-07-02 MED ORDER — DIVALPROEX SODIUM ER 500 MG PO TB24: 500.0000 mg | ORAL_TABLET | Freq: Every day | ORAL | Status: DC

## 2014-07-02 MED ORDER — POTASSIUM CHLORIDE CRYS ER 20 MEQ PO TBCR: 20.0000 meq | EXTENDED_RELEASE_TABLET | Freq: Every day | ORAL | Status: DC

## 2014-07-02 MED ORDER — ALPRAZOLAM 1 MG PO TABS: 1.0000 mg | ORAL_TABLET | Freq: Two times a day (BID) | ORAL | Status: DC | PRN
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
## 2014-07-03 NOTE — Progress Notes (Signed)
I was the preceptor on the day of this visit.   Kyle Fletke MD  

## 2014-07-05 ENCOUNTER — Other Ambulatory Visit (HOSPITAL_COMMUNITY): Payer: Self-pay

## 2014-07-05 ENCOUNTER — Emergency Department (HOSPITAL_COMMUNITY): Payer: Commercial Managed Care - HMO

## 2014-07-05 ENCOUNTER — Inpatient Hospital Stay (HOSPITAL_COMMUNITY)
Admission: EM | Admit: 2014-07-05 | Discharge: 2014-07-16 | DRG: 602 | Disposition: A | Discharge status: Other Acute Inpatient Hospital | Payer: Commercial Managed Care - HMO | Attending: Family Medicine | Admitting: Family Medicine

## 2014-07-05 ENCOUNTER — Other Ambulatory Visit: Payer: Self-pay

## 2014-07-05 ENCOUNTER — Encounter (HOSPITAL_COMMUNITY): Payer: Self-pay | Admitting: Emergency Medicine

## 2014-07-05 DIAGNOSIS — M069 Rheumatoid arthritis, unspecified: Secondary | ICD-10-CM | POA: Diagnosis present

## 2014-07-05 DIAGNOSIS — F1027 Alcohol dependence with alcohol-induced persisting dementia: Secondary | ICD-10-CM | POA: Diagnosis present

## 2014-07-05 DIAGNOSIS — Z8249 Family history of ischemic heart disease and other diseases of the circulatory system: Secondary | ICD-10-CM

## 2014-07-05 DIAGNOSIS — F250 Schizoaffective disorder, bipolar type: Secondary | ICD-10-CM

## 2014-07-05 DIAGNOSIS — Z79899 Other long term (current) drug therapy: Secondary | ICD-10-CM

## 2014-07-05 DIAGNOSIS — Z823 Family history of stroke: Secondary | ICD-10-CM

## 2014-07-05 DIAGNOSIS — I10 Essential (primary) hypertension: Secondary | ICD-10-CM | POA: Diagnosis present

## 2014-07-05 DIAGNOSIS — E785 Hyperlipidemia, unspecified: Secondary | ICD-10-CM | POA: Diagnosis present

## 2014-07-05 DIAGNOSIS — H5441 Blindness, right eye, normal vision left eye: Secondary | ICD-10-CM | POA: Diagnosis present

## 2014-07-05 DIAGNOSIS — K589 Irritable bowel syndrome without diarrhea: Secondary | ICD-10-CM | POA: Diagnosis present

## 2014-07-05 DIAGNOSIS — E876 Hypokalemia: Secondary | ICD-10-CM | POA: Diagnosis present

## 2014-07-05 DIAGNOSIS — I252 Old myocardial infarction: Secondary | ICD-10-CM

## 2014-07-05 DIAGNOSIS — Z9114 Patient's other noncompliance with medication regimen: Secondary | ICD-10-CM | POA: Diagnosis present

## 2014-07-05 DIAGNOSIS — N179 Acute kidney failure, unspecified: Secondary | ICD-10-CM | POA: Insufficient documentation

## 2014-07-05 DIAGNOSIS — R4182 Altered mental status, unspecified: Secondary | ICD-10-CM | POA: Insufficient documentation

## 2014-07-05 DIAGNOSIS — Z881 Allergy status to other antibiotic agents status: Secondary | ICD-10-CM

## 2014-07-05 DIAGNOSIS — M6282 Rhabdomyolysis: Secondary | ICD-10-CM | POA: Insufficient documentation

## 2014-07-05 DIAGNOSIS — Z888 Allergy status to other drugs, medicaments and biological substances status: Secondary | ICD-10-CM

## 2014-07-05 DIAGNOSIS — J449 Chronic obstructive pulmonary disease, unspecified: Secondary | ICD-10-CM | POA: Diagnosis present

## 2014-07-05 DIAGNOSIS — R0902 Hypoxemia: Secondary | ICD-10-CM | POA: Diagnosis present

## 2014-07-05 DIAGNOSIS — L03113 Cellulitis of right upper limb: Principal | ICD-10-CM | POA: Insufficient documentation

## 2014-07-05 DIAGNOSIS — E1142 Type 2 diabetes mellitus with diabetic polyneuropathy: Secondary | ICD-10-CM | POA: Diagnosis present

## 2014-07-05 DIAGNOSIS — F3189 Other bipolar disorder: Secondary | ICD-10-CM | POA: Diagnosis present

## 2014-07-05 DIAGNOSIS — K224 Dyskinesia of esophagus: Secondary | ICD-10-CM | POA: Diagnosis present

## 2014-07-05 DIAGNOSIS — F42 Obsessive-compulsive disorder: Secondary | ICD-10-CM | POA: Diagnosis present

## 2014-07-05 DIAGNOSIS — G40909 Epilepsy, unspecified, not intractable, without status epilepticus: Secondary | ICD-10-CM | POA: Diagnosis present

## 2014-07-05 DIAGNOSIS — K219 Gastro-esophageal reflux disease without esophagitis: Secondary | ICD-10-CM | POA: Diagnosis present

## 2014-07-05 DIAGNOSIS — Z882 Allergy status to sulfonamides status: Secondary | ICD-10-CM

## 2014-07-05 DIAGNOSIS — E860 Dehydration: Secondary | ICD-10-CM | POA: Diagnosis present

## 2014-07-05 DIAGNOSIS — Z9119 Patient's noncompliance with other medical treatment and regimen: Secondary | ICD-10-CM | POA: Diagnosis present

## 2014-07-05 DIAGNOSIS — H5709 Other anomalies of pupillary function: Secondary | ICD-10-CM | POA: Insufficient documentation

## 2014-07-05 DIAGNOSIS — F23 Brief psychotic disorder: Secondary | ICD-10-CM | POA: Insufficient documentation

## 2014-07-05 DIAGNOSIS — J189 Pneumonia, unspecified organism: Secondary | ICD-10-CM

## 2014-07-05 DIAGNOSIS — Z880 Allergy status to penicillin: Secondary | ICD-10-CM

## 2014-07-05 DIAGNOSIS — Z885 Allergy status to narcotic agent status: Secondary | ICD-10-CM

## 2014-07-05 DIAGNOSIS — J45909 Unspecified asthma, uncomplicated: Secondary | ICD-10-CM | POA: Diagnosis present

## 2014-07-05 DIAGNOSIS — R0602 Shortness of breath: Secondary | ICD-10-CM

## 2014-07-05 DIAGNOSIS — F17210 Nicotine dependence, cigarettes, uncomplicated: Secondary | ICD-10-CM | POA: Diagnosis present

## 2014-07-05 DIAGNOSIS — I480 Paroxysmal atrial fibrillation: Secondary | ICD-10-CM | POA: Diagnosis present

## 2014-07-05 DIAGNOSIS — Z811 Family history of alcohol abuse and dependence: Secondary | ICD-10-CM

## 2014-07-05 DIAGNOSIS — Z887 Allergy status to serum and vaccine status: Secondary | ICD-10-CM

## 2014-07-05 DIAGNOSIS — Z833 Family history of diabetes mellitus: Secondary | ICD-10-CM

## 2014-07-05 DIAGNOSIS — F200 Paranoid schizophrenia: Secondary | ICD-10-CM | POA: Diagnosis present

## 2014-07-05 DIAGNOSIS — Z7982 Long term (current) use of aspirin: Secondary | ICD-10-CM

## 2014-07-05 DIAGNOSIS — I7300 Raynaud's syndrome without gangrene: Secondary | ICD-10-CM | POA: Diagnosis present

## 2014-07-05 DIAGNOSIS — I2510 Atherosclerotic heart disease of native coronary artery without angina pectoris: Secondary | ICD-10-CM | POA: Diagnosis present

## 2014-07-05 DIAGNOSIS — L0390 Cellulitis, unspecified: Secondary | ICD-10-CM | POA: Diagnosis present

## 2014-07-05 HISTORY — DX: Unspecified asthma, uncomplicated: J45.909

## 2014-07-05 HISTORY — DX: Disorder of kidney and ureter, unspecified: N28.9

## 2014-07-05 HISTORY — DX: Malignant (primary) neoplasm, unspecified: C80.1

## 2014-07-05 HISTORY — DX: Systemic involvement of connective tissue, unspecified: M35.9

## 2014-07-05 LAB — SALICYLATE LEVEL

## 2014-07-05 LAB — CBC WITH DIFFERENTIAL/PLATELET
Basophils Absolute: 0 10*3/uL (ref 0.0–0.1)
Basophils Relative: 0 % (ref 0–1)
Eosinophils Absolute: 0 10*3/uL (ref 0.0–0.7)
Eosinophils Relative: 0 % (ref 0–5)
HEMATOCRIT: 45.2 % (ref 39.0–52.0)
Hemoglobin: 14.9 g/dL (ref 13.0–17.0)
LYMPHS ABS: 0.7 10*3/uL (ref 0.7–4.0)
LYMPHS PCT: 6 % — AB (ref 12–46)
MCH: 29.1 pg (ref 26.0–34.0)
MCHC: 33 g/dL (ref 30.0–36.0)
MCV: 88.3 fL (ref 78.0–100.0)
MONO ABS: 0.9 10*3/uL (ref 0.1–1.0)
MONOS PCT: 8 % (ref 3–12)
NEUTROS ABS: 10.1 10*3/uL — AB (ref 1.7–7.7)
Neutrophils Relative %: 86 % — ABNORMAL HIGH (ref 43–77)
Platelets: 252 10*3/uL (ref 150–400)
RBC: 5.12 MIL/uL (ref 4.22–5.81)
RDW: 14.9 % (ref 11.5–15.5)
WBC: 11.7 10*3/uL — ABNORMAL HIGH (ref 4.0–10.5)

## 2014-07-05 LAB — COMPREHENSIVE METABOLIC PANEL
ALT: 42 U/L (ref 17–63)
ANION GAP: 12 (ref 5–15)
AST: 76 U/L — ABNORMAL HIGH (ref 15–41)
Albumin: 4.2 g/dL (ref 3.5–5.0)
Alkaline Phosphatase: 73 U/L (ref 38–126)
BILIRUBIN TOTAL: 2 mg/dL — AB (ref 0.3–1.2)
BUN: 19 mg/dL (ref 6–20)
CHLORIDE: 105 mmol/L (ref 101–111)
CO2: 24 mmol/L (ref 22–32)
CREATININE: 1.75 mg/dL — AB (ref 0.61–1.24)
Calcium: 9.4 mg/dL (ref 8.9–10.3)
GFR calc non Af Amer: 39 mL/min — ABNORMAL LOW (ref >60)
GFR, EST AFRICAN AMERICAN: 45 mL/min — AB (ref >60)
Glucose, Bld: 70 mg/dL (ref 65–99)
Potassium: 3.1 mmol/L — ABNORMAL LOW (ref 3.5–5.1)
Sodium: 141 mmol/L (ref 135–145)
Total Protein: 7.5 g/dL (ref 6.5–8.1)

## 2014-07-05 LAB — RAPID URINE DRUG SCREEN, HOSP PERFORMED
Amphetamines: NOT DETECTED
Barbiturates: NOT DETECTED
Benzodiazepines: POSITIVE — AB
Cocaine: NOT DETECTED
Opiates: NOT DETECTED
TETRAHYDROCANNABINOL: NOT DETECTED

## 2014-07-05 LAB — URINALYSIS, ROUTINE W REFLEX MICROSCOPIC
GLUCOSE, UA: NEGATIVE mg/dL
Ketones, ur: 40 mg/dL — AB
Leukocytes, UA: NEGATIVE
Nitrite: NEGATIVE
PH: 5 (ref 5.0–8.0)
Protein, ur: 100 mg/dL — AB
SPECIFIC GRAVITY, URINE: 1.023 (ref 1.005–1.030)
Urobilinogen, UA: 1 mg/dL (ref 0.0–1.0)

## 2014-07-05 LAB — URINE MICROSCOPIC-ADD ON

## 2014-07-05 LAB — BASIC METABOLIC PANEL
Anion gap: 12 (ref 5–15)
BUN: 17 mg/dL (ref 6–20)
CO2: 23 mmol/L (ref 22–32)
CREATININE: 1.5 mg/dL — AB (ref 0.61–1.24)
Calcium: 8.7 mg/dL — ABNORMAL LOW (ref 8.9–10.3)
Chloride: 109 mmol/L (ref 101–111)
GFR calc non Af Amer: 47 mL/min — ABNORMAL LOW (ref >60)
GFR, EST AFRICAN AMERICAN: 55 mL/min — AB (ref >60)
Glucose, Bld: 87 mg/dL (ref 65–99)
Potassium: 2.8 mmol/L — ABNORMAL LOW (ref 3.5–5.1)
Sodium: 144 mmol/L (ref 135–145)

## 2014-07-05 LAB — ACETAMINOPHEN LEVEL: Acetaminophen (Tylenol), Serum: <10 ug/mL — ABNORMAL LOW (ref 10–30)

## 2014-07-05 LAB — CBG MONITORING, ED: Glucose-Capillary: 98 mg/dL (ref 65–99)

## 2014-07-05 MED ORDER — HALOPERIDOL LACTATE 5 MG/ML IJ SOLN
5.0000 mg | Freq: Once | INTRAMUSCULAR | Status: AC
  Administered 2014-07-05: 5 mg via INTRAMUSCULAR

## 2014-07-05 MED ORDER — HALOPERIDOL LACTATE 5 MG/ML IJ SOLN
INTRAMUSCULAR | Status: AC
  Filled 2014-07-05: qty 1

## 2014-07-05 MED ORDER — SODIUM CHLORIDE 0.9 % IV BOLUS (SEPSIS)
1000.0000 mL | Freq: Once | INTRAVENOUS | Status: AC
  Administered 2014-07-05: 1000 mL via INTRAVENOUS

## 2014-07-05 MED ORDER — LORAZEPAM 1 MG PO TABS
1.0000 mg | ORAL_TABLET | Freq: Once | ORAL | Status: AC
  Administered 2014-07-05: 1 mg via ORAL
  Filled 2014-07-05: qty 1

## 2014-07-05 MED ORDER — POTASSIUM CHLORIDE 10 MEQ/100ML IV SOLN
10.0000 meq | INTRAVENOUS | Status: AC
Start: 2014-07-05 — End: 2014-07-05
  Administered 2014-07-05 (×4): 10 meq via INTRAVENOUS
  Filled 2014-07-05 (×4): qty 100

## 2014-07-05 MED ORDER — DEXTROSE-NACL 5-0.45 % IV SOLN
INTRAVENOUS | Status: AC
  Administered 2014-07-05: 125 mL/h via INTRAVENOUS

## 2014-07-05 NOTE — BHH Counselor (Signed)
Writer spoke w/ Dr Olene Floss re: pt's presentation. Writer then attempted to do teleassessment with pt but pt unable to be woken up. Per tech present at beside, pt had just been given Haldol and it "kicked in". Writer asked if tech would request that TTS order be removed and that TTS consult order be reentered when pt awake.  Arnold Long, [place] Therapeutic Triage Specialist
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
## 2014-07-05 NOTE — ED Provider Notes (Signed)
CSN: [id number]     Arrival date & time 07/05/14  1143 History   First MD Initiated Contact with Patient 07/05/14 1217     Chief Complaint  Patient presents with  . Fall     (Consider location/radiation/quality/duration/timing/severity/associated sxs/prior Treatment) HPI Comments: 64 year old male well-known to the emergency department comes in with altered mental status. Per EMS report [employer] found him on his house lying in the floor very agitated and angry. Required Haldol and Versed for sick transport. Before giving medications and reported that he was erratic and making nonsensical statements. But was oriented to person and place. No further history is available at this time secondary to patient compliance. LEVEL 5 exception to history  Patient is a 64 y.o. male presenting with altered mental status.  Altered Mental Status Presenting symptoms: behavior changes and combativeness   Severity:  Unable to specify Episode history:  Unable to specify Duration: unable to specifiy. Timing:  Constant Progression:  Unchanged Chronicity:  Recurrent Context comment:  Schizophrenia   Past Medical History  Diagnosis Date  . Rheumatoid arthritis(714.0)   . Obesity   . Major depression     with acute psychotic break in 06/2010  . Hypertension   . Hyperlipidemia   . Diverticulosis of colon   . COPD (chronic obstructive pulmonary disease)   . Anxiety   . GERD (gastroesophageal reflux disease)   . Vertigo   . Fibromyalgia   . Dermatomyositis   . Myocardial infarct     mulitple (1999, 2000, 2004)  . Raynaud's disease   . Narcotic dependence   . Peripheral neuropathy   . Internal hemorrhoids   . Ischemic heart disease   . Hiatal hernia   . Gastritis   . Diverticulitis   . Hx of adenomatous colonic polyps   . Nephrolithiasis   . Anemia   . Esophageal stricture   . Esophageal dysmotility   . Dermatomyositis   . Urge incontinence   . Otosclerosis   . Bipolar 1 disorder    . OCD (obsessive compulsive disorder)   . Sarcoidosis   . Paroxysmal a-fib   . Dysrhythmia     "irregular" (11/15/2012)  . Type II diabetes mellitus   . Seizures   . Headache(784.0)     "severe; get them often" (11/15/2012)  . Subarachnoid hemorrhage 01/2012    with subdural  hematoma.   . Atrial fibrillation 01/2012    with RVR  . Conversion disorder 06/2010  . History of narcotic addiction    Past Surgical History  Procedure Laterality Date  . Knee arthroscopy w/ meniscal repair Left 2009  . Lumbar disc surgery    . Squamous papilloma   2010    removed by Dr. Constance Holster ENT, noted on tongue  . Esophagogastroduodenoscopy N/A 09/27/2012    Procedure: ESOPHAGOGASTRODUODENOSCOPY (EGD);  Surgeon: Lafayette Dragon, MD;  Location: [facility];  Service: Endoscopy;  Laterality: N/A;  . Colonoscopy N/A 09/27/2012    Procedure: COLONOSCOPY;  Surgeon: Lafayette Dragon, MD;  Location: [facility];  Service: Endoscopy;  Laterality: N/A;  . Cataract extraction w/ intraocular lens implant Left   . Lymph node dissection Right 1970's    "neck; dr thought I had Hodgkins; turned out to be sarcoidosis" (11/15/2012)  . Tonsillectomy    . Carpal tunnel release Bilateral   . Cardiac catheterization    . Back surgery     Family History  Problem Relation Age of Onset  . Alcohol abuse Mother   .  Depression Mother   . Heart disease Mother   . Diabetes Mother   . Stroke Mother   . Diabetes Other     1/2 brother  . Hepatitis Brother    History  Substance Use Topics  . Smoking status: Current Every Day Smoker -- 0.50 packs/day for 30 years    Types: Cigarettes  . Smokeless tobacco: Never Used     Comment: 3 cigs a day  . Alcohol Use: No     Comment: Alcohol stopped in September of 2014    Review of Systems  Unable to perform ROS     Allergies  Immune globulins; Ciprofloxacin; Flagyl; Lisinopril; Sulfa antibiotics; Betamethasone dipropionate; Bupropion hcl; Clobetasol; Codeine; Escitalopram  oxalate; Fluoxetine hcl; Fluticasone-salmeterol; Furosemide; Paroxetine; Penicillins; Tacrolimus; Tetanus toxoid; and Tuberculin purified protein derivative  Home Medications   Prior to Admission medications   Medication Sig Start Date End Date Taking? Authorizing Provider  albuterol-ipratropium (COMBIVENT) 18-103 MCG/ACT inhaler Inhale 1 puff into the lungs daily. 06/20/13   Gerda Diss, DO  ALPRAZolam Duanne Moron) 1 MG tablet Take 1 tablet (1 mg total) by mouth 2 (two) times daily as needed for anxiety. 07/02/14   Archie Patten, MD  amiodarone (PACERONE) 200 MG tablet Take 1 tablet (200 mg total) by mouth daily. 07/02/14   Archie Patten, MD  atorvastatin (LIPITOR) 40 MG tablet TAKE 1 TABLET BY MOUTH DAILY 04/29/14   Renee A Kuneff, DO  divalproex (DEPAKOTE ER) 500 MG 24 hr tablet Take 1 tablet (500 mg total) by mouth daily. 07/02/14   Archie Patten, MD  Ipratropium-Albuterol (COMBIVENT) 20-100 MCG/ACT AERS respimat Inhale 1 puff into the lungs daily.    Historical Provider, MD  levETIRAcetam (KEPPRA) 500 MG tablet Take 1 tablet (500 mg total) by mouth 2 (two) times daily. 07/02/14   Archie Patten, MD  Multiple Vitamin (MULTIVITAMIN WITH MINERALS) TABS tablet Take 1 tablet by mouth daily. 12/29/12   Kandis Nab, MD  NEXIUM 40 MG capsule TAKE ONE CAPSULE BY MOUTH EVERY MORNING WITH BREAKFAST 03/07/14   Renee A Kuneff, DO  ondansetron (ZOFRAN ODT) 4 MG disintegrating tablet 4mg  ODT q4 hours prn nausea/vomit Patient not taking: Reported on 03/29/2014 10/09/13   Elnora Morrison, MD  oxybutynin (DITROPAN) 5 MG tablet Take 1 tablet (5 mg total) by mouth 3 (three) times daily. 07/02/14   Archie Patten, MD  polyethylene glycol powder (GLYCOLAX/MIRALAX) powder Take 17 g by mouth daily. 06/25/14   Renee A Kuneff, DO  potassium chloride SA (K-DUR,KLOR-CON) 20 MEQ tablet Take 1 tablet (20 mEq total) by mouth daily. 07/02/14   Archie Patten, MD  rOPINIRole (REQUIP) 2 MG tablet Take 1 tablet (2 mg  total) by mouth 3 (three) times daily. 07/02/14   Archie Patten, MD   BP 147/67 mmHg  Pulse 79  Temp(Src) 97.3 F (36.3 C) (Axillary)  Resp 19  SpO2 92% Physical Exam  Constitutional: He appears well-developed.  HENT:  Head: Normocephalic and atraumatic.  Eyes: Pupils are equal, round, and reactive to light.  Neck: Normal range of motion.  Cardiovascular: Normal rate and intact distal pulses.   Pulmonary/Chest: Effort normal. No respiratory distress.  Abdominal: Soft. He exhibits no distension. There is no tenderness.  Musculoskeletal: He exhibits no tenderness.  No gross deformities  Neurological:  Patient rousable sternal rubs. Maintaining an airway. Technically limited exam secondary to patient compliance. However no gross cranial nerve defects. Moving all extremities. Withdraws to pain.  Skin: Skin  is warm.  Psychiatric:  Unable to assess   Vitals reviewed.   ED Course  Procedures (including critical care time) Labs Review Labs Reviewed  CBC WITH DIFFERENTIAL/PLATELET - Abnormal; Notable for the following:    WBC 11.7 (*)    Neutrophils Relative % 86 (*)    Neutro Abs 10.1 (*)    Lymphocytes Relative 6 (*)    All other components within normal limits  COMPREHENSIVE METABOLIC PANEL - Abnormal; Notable for the following:    Potassium 3.1 (*)    Creatinine, Ser 1.75 (*)    AST 76 (*)    Total Bilirubin 2.0 (*)    GFR calc non Af Amer 39 (*)    GFR calc Af Amer 45 (*)    All other components within normal limits  ACETAMINOPHEN LEVEL - Abnormal; Notable for the following:    Acetaminophen (Tylenol), Serum <10 (*)    All other components within normal limits  SALICYLATE LEVEL  URINALYSIS, ROUTINE W REFLEX MICROSCOPIC  URINE RAPID DRUG SCREEN (HOSP PERFORMED)  BASIC METABOLIC PANEL  CBG MONITORING, ED    Imaging Review Dg Chest Portable 1 View  07/05/2014   CLINICAL DATA:  Recent fall  EXAM: PORTABLE CHEST - 1 VIEW  COMPARISON:  03/29/2014  FINDINGS: Cardiac  shadow is mildly enlarged but accentuated by the portable technique. Mild vascular congestion is noted. Right basilar atelectasis is noted stable from the prior exam. No new focal infiltrate is seen. No bony abnormality is noted.  IMPRESSION: Right basilar atelectasis.  Mild vascular congestion.   Electronically Signed   By: Inez Catalina M.D.   On: 07/05/2014 13:09     EKG Interpretation None      MDM  64 year old male with history of schizophrenia comes in with decompensated schizophrenia. Patient is agitated and confused in the ED requiring Haldol. Prior to use Haldol patient was very tangential in his thinking and perseverating on "terrorists". He is otherwise hemodynamically stable. Basic labs were obtained as well as head CT. CT unremarkable. CBC was only very slightly cytosis. Patient has no obvious infectious symptoms on exam. Likely not representing an infectious issue at this time. CMP showed slight hypokalemia. Although mild. Also had a very mild creatinine bumped from 1.1 baseline to 1.75. Possibly due to decreased by mouth intake with his decompensate schizophrenia last 48 hours. Have given 1 L normal saline bolus start maintenance fluids. Recheck after IVF completed. Psychiatry is been consulted for admission. Awaiting recs at this time. At 4 PM patient care Dr. Vanita Panda   Final diagnoses:  None        Robynn Pane, MD 07/05/14 [place], MD 07/09/14 [phone]

## 2014-07-05 NOTE — ED Notes (Signed)
Right arm noted to be swollen with large weeping blisters from previous IV infiltration.  To continue to monitor.

## 2014-07-05 NOTE — BHH Counselor (Signed)
Writer called Emmy at [employer] and asked her to removed TTS consult ordered at 1513. Writer requested a new TTS consult order be placed when pt is alert enough to do teleassessment.  Arnold Long, [place] Therapeutic Triage Specialist

## 2014-07-05 NOTE — ED Notes (Signed)
[facility] stretcher alarm placed for fall prevention.

## 2014-07-05 NOTE — ED Notes (Signed)
CT coming to pick up pt.

## 2014-07-05 NOTE — Progress Notes (Addendum)
Patient has been referred for geriatric IP treatment at: [facility] - per Nunzio Cory, fax referral. [facility] - per Maudie Mercury, fax it. [facility] - per RN Denny Peon, fax referral but it won't be until a few hours that I will be able to get to it. [facility] - per Tamela Oddi, 2 beds open, fax referral. [facility] - went to voicemail  At capacity: [facility] per [place], all weekend [facility] will continue to seek placement.  Verlon Setting, [place] Disposition staff 07/05/2014 10:04 PM

## 2014-07-05 NOTE — ED Notes (Signed)
Pt here from home ems called by [employer] due to pt lying in floor , upon ems arrival pt agitated and was given 5 versed and 5 of haldol , pt arrived alert and oriented but talking out of his head

## 2014-07-05 NOTE — ED Notes (Signed)
BP and pulse noted to be elevated at this time.  Patient very agitated.  Physician notified.  To give order for ativan.
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
## 2014-07-06 ENCOUNTER — Emergency Department (HOSPITAL_COMMUNITY): Payer: Commercial Managed Care - HMO

## 2014-07-06 ENCOUNTER — Encounter (HOSPITAL_COMMUNITY): Payer: Self-pay

## 2014-07-06 DIAGNOSIS — I10 Essential (primary) hypertension: Secondary | ICD-10-CM | POA: Diagnosis present

## 2014-07-06 DIAGNOSIS — Z833 Family history of diabetes mellitus: Secondary | ICD-10-CM | POA: Diagnosis not present

## 2014-07-06 DIAGNOSIS — I2510 Atherosclerotic heart disease of native coronary artery without angina pectoris: Secondary | ICD-10-CM | POA: Diagnosis present

## 2014-07-06 DIAGNOSIS — E860 Dehydration: Secondary | ICD-10-CM | POA: Diagnosis present

## 2014-07-06 DIAGNOSIS — E876 Hypokalemia: Secondary | ICD-10-CM | POA: Diagnosis present

## 2014-07-06 DIAGNOSIS — K589 Irritable bowel syndrome without diarrhea: Secondary | ICD-10-CM | POA: Diagnosis present

## 2014-07-06 DIAGNOSIS — Z881 Allergy status to other antibiotic agents status: Secondary | ICD-10-CM | POA: Diagnosis not present

## 2014-07-06 DIAGNOSIS — Z885 Allergy status to narcotic agent status: Secondary | ICD-10-CM | POA: Diagnosis not present

## 2014-07-06 DIAGNOSIS — Z811 Family history of alcohol abuse and dependence: Secondary | ICD-10-CM | POA: Diagnosis not present

## 2014-07-06 DIAGNOSIS — M069 Rheumatoid arthritis, unspecified: Secondary | ICD-10-CM | POA: Diagnosis present

## 2014-07-06 DIAGNOSIS — R4182 Altered mental status, unspecified: Secondary | ICD-10-CM | POA: Diagnosis present

## 2014-07-06 DIAGNOSIS — J449 Chronic obstructive pulmonary disease, unspecified: Secondary | ICD-10-CM | POA: Diagnosis present

## 2014-07-06 DIAGNOSIS — F1027 Alcohol dependence with alcohol-induced persisting dementia: Secondary | ICD-10-CM | POA: Diagnosis present

## 2014-07-06 DIAGNOSIS — F209 Schizophrenia, unspecified: Secondary | ICD-10-CM | POA: Diagnosis not present

## 2014-07-06 DIAGNOSIS — N179 Acute kidney failure, unspecified: Secondary | ICD-10-CM | POA: Diagnosis present

## 2014-07-06 DIAGNOSIS — Z7982 Long term (current) use of aspirin: Secondary | ICD-10-CM | POA: Diagnosis not present

## 2014-07-06 DIAGNOSIS — F42 Obsessive-compulsive disorder: Secondary | ICD-10-CM | POA: Diagnosis present

## 2014-07-06 DIAGNOSIS — K224 Dyskinesia of esophagus: Secondary | ICD-10-CM | POA: Diagnosis present

## 2014-07-06 DIAGNOSIS — J45909 Unspecified asthma, uncomplicated: Secondary | ICD-10-CM | POA: Diagnosis present

## 2014-07-06 DIAGNOSIS — Z79899 Other long term (current) drug therapy: Secondary | ICD-10-CM | POA: Diagnosis not present

## 2014-07-06 DIAGNOSIS — F17210 Nicotine dependence, cigarettes, uncomplicated: Secondary | ICD-10-CM | POA: Diagnosis present

## 2014-07-06 DIAGNOSIS — Z9114 Patient's other noncompliance with medication regimen: Secondary | ICD-10-CM | POA: Diagnosis present

## 2014-07-06 DIAGNOSIS — R0902 Hypoxemia: Secondary | ICD-10-CM | POA: Diagnosis present

## 2014-07-06 DIAGNOSIS — H5441 Blindness, right eye, normal vision left eye: Secondary | ICD-10-CM | POA: Diagnosis present

## 2014-07-06 DIAGNOSIS — K219 Gastro-esophageal reflux disease without esophagitis: Secondary | ICD-10-CM | POA: Diagnosis present

## 2014-07-06 DIAGNOSIS — Z888 Allergy status to other drugs, medicaments and biological substances status: Secondary | ICD-10-CM | POA: Diagnosis not present

## 2014-07-06 DIAGNOSIS — F22 Delusional disorders: Secondary | ICD-10-CM | POA: Diagnosis not present

## 2014-07-06 DIAGNOSIS — F3189 Other bipolar disorder: Secondary | ICD-10-CM | POA: Diagnosis present

## 2014-07-06 DIAGNOSIS — Z823 Family history of stroke: Secondary | ICD-10-CM | POA: Diagnosis not present

## 2014-07-06 DIAGNOSIS — E1142 Type 2 diabetes mellitus with diabetic polyneuropathy: Secondary | ICD-10-CM | POA: Diagnosis present

## 2014-07-06 DIAGNOSIS — F259 Schizoaffective disorder, unspecified: Secondary | ICD-10-CM | POA: Diagnosis not present

## 2014-07-06 DIAGNOSIS — Z9119 Patient's noncompliance with other medical treatment and regimen: Secondary | ICD-10-CM | POA: Diagnosis present

## 2014-07-06 DIAGNOSIS — F200 Paranoid schizophrenia: Secondary | ICD-10-CM | POA: Diagnosis present

## 2014-07-06 DIAGNOSIS — Z880 Allergy status to penicillin: Secondary | ICD-10-CM | POA: Diagnosis not present

## 2014-07-06 DIAGNOSIS — M6282 Rhabdomyolysis: Secondary | ICD-10-CM | POA: Diagnosis present

## 2014-07-06 DIAGNOSIS — E785 Hyperlipidemia, unspecified: Secondary | ICD-10-CM | POA: Diagnosis present

## 2014-07-06 DIAGNOSIS — I7300 Raynaud's syndrome without gangrene: Secondary | ICD-10-CM | POA: Diagnosis present

## 2014-07-06 DIAGNOSIS — Z882 Allergy status to sulfonamides status: Secondary | ICD-10-CM | POA: Diagnosis not present

## 2014-07-06 DIAGNOSIS — L03113 Cellulitis of right upper limb: Secondary | ICD-10-CM | POA: Diagnosis present

## 2014-07-06 DIAGNOSIS — G40909 Epilepsy, unspecified, not intractable, without status epilepticus: Secondary | ICD-10-CM | POA: Diagnosis present

## 2014-07-06 DIAGNOSIS — Z8249 Family history of ischemic heart disease and other diseases of the circulatory system: Secondary | ICD-10-CM | POA: Diagnosis not present

## 2014-07-06 DIAGNOSIS — J189 Pneumonia, unspecified organism: Secondary | ICD-10-CM | POA: Diagnosis present

## 2014-07-06 DIAGNOSIS — Z887 Allergy status to serum and vaccine status: Secondary | ICD-10-CM | POA: Diagnosis not present

## 2014-07-06 DIAGNOSIS — I480 Paroxysmal atrial fibrillation: Secondary | ICD-10-CM | POA: Diagnosis present

## 2014-07-06 DIAGNOSIS — L0390 Cellulitis, unspecified: Secondary | ICD-10-CM | POA: Diagnosis present

## 2014-07-06 DIAGNOSIS — T796XXA Traumatic ischemia of muscle, initial encounter: Secondary | ICD-10-CM | POA: Diagnosis not present

## 2014-07-06 DIAGNOSIS — I252 Old myocardial infarction: Secondary | ICD-10-CM | POA: Diagnosis not present

## 2014-07-06 LAB — BASIC METABOLIC PANEL
ANION GAP: 11 (ref 5–15)
BUN: 13 mg/dL (ref 6–20)
CHLORIDE: 108 mmol/L (ref 101–111)
CO2: 24 mmol/L (ref 22–32)
Calcium: 8.9 mg/dL (ref 8.9–10.3)
Creatinine, Ser: 1.24 mg/dL (ref 0.61–1.24)
GFR calc non Af Amer: 59 mL/min — ABNORMAL LOW (ref >60)
Glucose, Bld: 115 mg/dL — ABNORMAL HIGH (ref 65–99)
POTASSIUM: 3.3 mmol/L — AB (ref 3.5–5.1)
SODIUM: 143 mmol/L (ref 135–145)

## 2014-07-06 LAB — CBC WITH DIFFERENTIAL/PLATELET
BASOS ABS: 0 10*3/uL (ref 0.0–0.1)
Basophils Relative: 0 % (ref 0–1)
EOS PCT: 0 % (ref 0–5)
Eosinophils Absolute: 0 10*3/uL (ref 0.0–0.7)
HCT: 40.8 % (ref 39.0–52.0)
Hemoglobin: 13 g/dL (ref 13.0–17.0)
LYMPHS ABS: 1.2 10*3/uL (ref 0.7–4.0)
Lymphocytes Relative: 14 % (ref 12–46)
MCH: 28.6 pg (ref 26.0–34.0)
MCHC: 31.9 g/dL (ref 30.0–36.0)
MCV: 89.9 fL (ref 78.0–100.0)
MONOS PCT: 11 % (ref 3–12)
Monocytes Absolute: 0.9 10*3/uL (ref 0.1–1.0)
NEUTROS ABS: 6 10*3/uL (ref 1.7–7.7)
Neutrophils Relative %: 75 % (ref 43–77)
Platelets: 229 10*3/uL (ref 150–400)
RBC: 4.54 MIL/uL (ref 4.22–5.81)
RDW: 15.4 % (ref 11.5–15.5)
WBC: 8.1 10*3/uL (ref 4.0–10.5)

## 2014-07-06 LAB — I-STAT ARTERIAL BLOOD GAS, ED
Acid-base deficit: 1 mmol/L (ref 0.0–2.0)
Bicarbonate: 25.3 mEq/L — ABNORMAL HIGH (ref 20.0–24.0)
O2 Saturation: 94 %
PCO2 ART: 49.4 mmHg — AB (ref 35.0–45.0)
PO2 ART: 79 mmHg — AB (ref 80.0–100.0)
TCO2: 27 mmol/L (ref 0–100)
pH, Arterial: 7.32 — ABNORMAL LOW (ref 7.350–7.450)

## 2014-07-06 LAB — TROPONIN I: Troponin I: <0.03 ng/mL (ref <0.031)

## 2014-07-06 LAB — VALPROIC ACID LEVEL: Valproic Acid Lvl: 26 ug/mL — ABNORMAL LOW (ref 50.0–100.0)

## 2014-07-06 LAB — LACTIC ACID, PLASMA
Lactic Acid, Venous: 0.9 mmol/L (ref 0.5–2.0)
Lactic Acid, Venous: 0.9 mmol/L (ref 0.5–2.0)

## 2014-07-06 LAB — AMMONIA: Ammonia: 57 umol/L — ABNORMAL HIGH (ref 9–35)

## 2014-07-06 LAB — APTT: APTT: 34 s (ref 24–37)

## 2014-07-06 LAB — CK: Total CK: 2909 U/L — ABNORMAL HIGH (ref 49–397)

## 2014-07-06 LAB — PROTIME-INR
INR: 1.1 (ref 0.00–1.49)
PROTHROMBIN TIME: 14.4 s (ref 11.6–15.2)

## 2014-07-06 LAB — TSH: TSH: 0.463 u[IU]/mL (ref 0.350–4.500)

## 2014-07-06 MED ORDER — HEPARIN SODIUM (PORCINE) 5000 UNIT/ML IJ SOLN
5000.0000 [IU] | Freq: Three times a day (TID) | INTRAMUSCULAR | Status: DC
  Administered 2014-07-06 – 2014-07-16 (×30): 5000 [IU] via SUBCUTANEOUS
  Filled 2014-07-06 (×34): qty 1

## 2014-07-06 MED ORDER — ONDANSETRON 4 MG PO TBDP: 4.0000 mg | ORAL_TABLET | Freq: Three times a day (TID) | ORAL | Status: DC | PRN

## 2014-07-06 MED ORDER — ASPIRIN EC 81 MG PO TBEC
81.0000 mg | DELAYED_RELEASE_TABLET | Freq: Every day | ORAL | Status: DC
  Administered 2014-07-06 – 2014-07-16 (×11): 81 mg via ORAL
  Filled 2014-07-06 (×11): qty 1

## 2014-07-06 MED ORDER — OXYBUTYNIN CHLORIDE 5 MG PO TABS
5.0000 mg | ORAL_TABLET | Freq: Three times a day (TID) | ORAL | Status: DC
  Administered 2014-07-06 (×2): 5 mg via ORAL
  Filled 2014-07-06 (×4): qty 1

## 2014-07-06 MED ORDER — SODIUM CHLORIDE 0.9 % IV SOLN
Freq: Once | INTRAVENOUS | Status: AC
  Administered 2014-07-06: 15:00:00 via INTRAVENOUS

## 2014-07-06 MED ORDER — AMIODARONE HCL 200 MG PO TABS
200.0000 mg | ORAL_TABLET | Freq: Every day | ORAL | Status: DC
  Administered 2014-07-06: 200 mg via ORAL
  Filled 2014-07-06: qty 1

## 2014-07-06 MED ORDER — DIVALPROEX SODIUM ER 500 MG PO TB24
500.0000 mg | ORAL_TABLET | Freq: Every day | ORAL | Status: DC
  Administered 2014-07-06: 500 mg via ORAL
  Filled 2014-07-06: qty 1

## 2014-07-06 MED ORDER — ATORVASTATIN CALCIUM 40 MG PO TABS
40.0000 mg | ORAL_TABLET | Freq: Every day | ORAL | Status: DC
  Administered 2014-07-06 – 2014-07-15 (×10): 40 mg via ORAL
  Filled 2014-07-06 (×11): qty 1

## 2014-07-06 MED ORDER — IOHEXOL 350 MG/ML SOLN
80.0000 mL | Freq: Once | INTRAVENOUS | Status: AC | PRN
  Administered 2014-07-06: 80 mL via INTRAVENOUS

## 2014-07-06 MED ORDER — LORAZEPAM 1 MG PO TABS: 1.0000 mg | ORAL_TABLET | ORAL | Status: DC | PRN

## 2014-07-06 MED ORDER — IPRATROPIUM-ALBUTEROL 0.5-2.5 (3) MG/3ML IN SOLN: 3.0000 mL | Freq: Every day | RESPIRATORY_TRACT | Status: DC

## 2014-07-06 MED ORDER — FOLIC ACID 5 MG/ML IJ SOLN
1.0000 mg | Freq: Every day | INTRAMUSCULAR | Status: DC
  Administered 2014-07-06 – 2014-07-08 (×3): 1 mg via INTRAVENOUS
  Filled 2014-07-06 (×4): qty 0.2

## 2014-07-06 MED ORDER — ALPRAZOLAM 0.25 MG PO TABS
1.0000 mg | ORAL_TABLET | Freq: Two times a day (BID) | ORAL | Status: DC | PRN
  Administered 2014-07-06: 1 mg via ORAL
  Filled 2014-07-06: qty 2

## 2014-07-06 MED ORDER — POTASSIUM CHLORIDE CRYS ER 20 MEQ PO TBCR
20.0000 meq | EXTENDED_RELEASE_TABLET | Freq: Every day | ORAL | Status: DC
  Administered 2014-07-06: 20 meq via ORAL
  Filled 2014-07-06: qty 1

## 2014-07-06 MED ORDER — PANTOPRAZOLE SODIUM 40 MG PO TBEC
40.0000 mg | DELAYED_RELEASE_TABLET | Freq: Every day | ORAL | Status: DC
  Administered 2014-07-06: 40 mg via ORAL
  Filled 2014-07-06: qty 1

## 2014-07-06 MED ORDER — SODIUM CHLORIDE 0.9 % IV BOLUS (SEPSIS): 1000.0000 mL | INTRAVENOUS | Status: AC

## 2014-07-06 MED ORDER — THIAMINE HCL 100 MG/ML IJ SOLN
100.0000 mg | Freq: Every day | INTRAMUSCULAR | Status: DC
  Administered 2014-07-06 – 2014-07-08 (×3): 100 mg via INTRAVENOUS
  Filled 2014-07-06 (×4): qty 1

## 2014-07-06 MED ORDER — LEVETIRACETAM 500 MG PO TABS
500.0000 mg | ORAL_TABLET | Freq: Two times a day (BID) | ORAL | Status: DC
  Administered 2014-07-06 – 2014-07-16 (×20): 500 mg via ORAL
  Filled 2014-07-06 (×21): qty 1

## 2014-07-06 MED ORDER — VANCOMYCIN HCL IN DEXTROSE 1-5 GM/200ML-% IV SOLN: 1000.0000 mg | Freq: Once | INTRAVENOUS | Status: DC

## 2014-07-06 MED ORDER — POTASSIUM CHLORIDE CRYS ER 20 MEQ PO TBCR: 20.0000 meq | EXTENDED_RELEASE_TABLET | Freq: Every day | ORAL | Status: DC

## 2014-07-06 MED ORDER — AMIODARONE HCL 200 MG PO TABS
200.0000 mg | ORAL_TABLET | Freq: Every day | ORAL | Status: DC
  Administered 2014-07-07 – 2014-07-16 (×10): 200 mg via ORAL
  Filled 2014-07-06 (×10): qty 1

## 2014-07-06 MED ORDER — PIPERACILLIN-TAZOBACTAM 3.375 G IVPB 30 MIN: 3.3750 g | Freq: Once | INTRAVENOUS | Status: DC

## 2014-07-06 MED ORDER — DEXTROSE 5 % IV SOLN
1.0000 g | Freq: Once | INTRAVENOUS | Status: AC
  Administered 2014-07-06: 1 g via INTRAVENOUS
  Filled 2014-07-06: qty 10

## 2014-07-06 MED ORDER — SODIUM CHLORIDE 0.9 % IV BOLUS (SEPSIS): 125.0000 mL | INTRAVENOUS | Status: AC

## 2014-07-06 MED ORDER — ATORVASTATIN CALCIUM 40 MG PO TABS
40.0000 mg | ORAL_TABLET | Freq: Every day | ORAL | Status: DC
  Filled 2014-07-06: qty 1

## 2014-07-06 MED ORDER — IPRATROPIUM-ALBUTEROL 0.5-2.5 (3) MG/3ML IN SOLN
3.0000 mL | Freq: Every day | RESPIRATORY_TRACT | Status: DC
  Administered 2014-07-06: 3 mL via RESPIRATORY_TRACT
  Filled 2014-07-06: qty 3

## 2014-07-06 MED ORDER — AMIODARONE HCL 200 MG PO TABS
200.0000 mg | ORAL_TABLET | Freq: Every day | ORAL | Status: DC
  Filled 2014-07-06: qty 1

## 2014-07-06 MED ORDER — SODIUM CHLORIDE 0.9 % IJ SOLN
3.0000 mL | Freq: Two times a day (BID) | INTRAMUSCULAR | Status: DC
  Administered 2014-07-06 – 2014-07-16 (×18): 3 mL via INTRAVENOUS

## 2014-07-06 MED ORDER — ROPINIROLE HCL 1 MG PO TABS
2.0000 mg | ORAL_TABLET | Freq: Three times a day (TID) | ORAL | Status: DC
Start: 2014-07-06 — End: 2014-07-06
  Administered 2014-07-06 (×2): 2 mg via ORAL
  Filled 2014-07-06 (×4): qty 2

## 2014-07-06 MED ORDER — CEFTRIAXONE SODIUM IN DEXTROSE 20 MG/ML IV SOLN
1.0000 g | Freq: Once | INTRAVENOUS | Status: AC
  Administered 2014-07-06: 1 g via INTRAVENOUS
  Filled 2014-07-06: qty 50

## 2014-07-06 MED ORDER — DIVALPROEX SODIUM 250 MG PO DR TAB
500.0000 mg | DELAYED_RELEASE_TABLET | Freq: Once | ORAL | Status: AC
  Administered 2014-07-06: 500 mg via ORAL
  Filled 2014-07-06: qty 2

## 2014-07-06 MED ORDER — VANCOMYCIN HCL 10 G IV SOLR
1500.0000 mg | INTRAVENOUS | Status: AC
  Administered 2014-07-07: 1500 mg via INTRAVENOUS
  Filled 2014-07-06: qty 1500

## 2014-07-06 MED ORDER — ATORVASTATIN CALCIUM 40 MG PO TABS
40.0000 mg | ORAL_TABLET | Freq: Every day | ORAL | Status: DC
  Administered 2014-07-06: 40 mg via ORAL
  Filled 2014-07-06: qty 1

## 2014-07-06 MED ORDER — VANCOMYCIN HCL 1000 MG IV SOLR
750.0000 mg | Freq: Two times a day (BID) | INTRAVENOUS | Status: DC
  Filled 2014-07-06: qty 750

## 2014-07-06 MED ORDER — DEXTROSE 5 % IV SOLN
500.0000 mg | Freq: Once | INTRAVENOUS | Status: AC
  Administered 2014-07-06: 500 mg via INTRAVENOUS
  Filled 2014-07-06: qty 500

## 2014-07-06 MED ORDER — LEVETIRACETAM 500 MG PO TABS
500.0000 mg | ORAL_TABLET | Freq: Two times a day (BID) | ORAL | Status: DC
  Administered 2014-07-06: 500 mg via ORAL
  Filled 2014-07-06 (×2): qty 1

## 2014-07-06 MED ORDER — POLYETHYLENE GLYCOL 3350 17 G PO PACK
17.0000 g | PACK | Freq: Every day | ORAL | Status: DC
  Administered 2014-07-06: 17 g via ORAL
  Filled 2014-07-06: qty 1

## 2014-07-06 MED ORDER — ROPINIROLE HCL 1 MG PO TABS
2.0000 mg | ORAL_TABLET | Freq: Two times a day (BID) | ORAL | Status: DC
  Administered 2014-07-07 – 2014-07-08 (×3): 2 mg via ORAL
  Filled 2014-07-06 (×4): qty 2

## 2014-07-06 NOTE — ED Notes (Addendum)
ATTEMPTED TO CALL PT'S FRIEND SHARON - [phone] TO ADVISE OF ADMISSION PER PT'S REQUEST EARLIER - NO ANSWER.

## 2014-07-06 NOTE — ED Notes (Addendum)
Dr Darl Householder aware pt's O2 sat 86-87% on room air. Order received for CT Angio. Dr Darl Householder also aware of pt's right forearm w/blisters, redness, and peeling.

## 2014-07-06 NOTE — ED Notes (Signed)
Pharmacy Tech attempted to awaken pt to perform Med Rec. Pt remains sleeping.

## 2014-07-06 NOTE — ED Notes (Signed)
LAB ADVISED SHOULD BE BMET SHOULD RESULT W/IN 10 MIN.

## 2014-07-06 NOTE — ED Notes (Signed)
Attempted to call report to Olivia Mackie, RN 3S - secretary advised she needs to return call.

## 2014-07-06 NOTE — ED Notes (Signed)
Pt moved to C23 via bed so may be placed on monitor.

## 2014-07-06 NOTE — ED Notes (Signed)
Patient resting quietly at this time.  No longer needs a Air cabin crew.  To reassess as needed.

## 2014-07-06 NOTE — ED Notes (Signed)
Patrick Holt, Agricultural consultant, aware of need for Air cabin crew. Pt continuously fidgeting w/gown, brief, sheets, call bell, dressing to right arm. Pt noted to be extremely restless. Dr Darl Householder aware and Xanax given.
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
## 2014-07-06 NOTE — Consult Note (Signed)
[facility] for :   Vancomycin Indication:  Cellulitis, broadening coverage for possible MRSA   Hospital Problems: Active Problems:   Cellulitis  Allergies: Allergies  Allergen Reactions  . Immune Globulins Other (See Comments)    Acute renal failure  . Ciprofloxacin Swelling  . Flagyl [Metronidazole] Swelling  . Lisinopril Diarrhea  . Sulfa Antibiotics Other (See Comments)    blisters  . Betamethasone Dipropionate Other (See Comments)    Unknown  . Bupropion Hcl Other (See Comments)    Unknown  . Clobetasol Other (See Comments)    Unknown  . Codeine Other (See Comments)    Unknown  . Escitalopram Oxalate Other (See Comments)    Unknown  . Fluoxetine Hcl Other (See Comments)    Unknown  . Fluticasone-Salmeterol Other (See Comments)    Unknown  . Furosemide Other (See Comments)    Unknown  . Paroxetine Other (See Comments)    Unknown  . Penicillins Other (See Comments)    SWELLING TONGUE, DIFFICULTY BREATHING [per pt]  . Tacrolimus Other (See Comments)    Unknown  . Tetanus Toxoid Other (See Comments)    Unknown  . Tuberculin Purified Protein Derivative Other (See Comments)    Unknown    Patient Measurements: Height: 5\' 5"  (165.1 cm) Weight: 236 lb 8.9 oz (107.3 kg) IBW/kg (Calculated) : 61.5 Vital Signs: Temp: 99.8 F (37.7 C) (05/21 1432) Temp Source: Oral (05/21 1432) BP: 125/75 mmHg (05/21 1805) Pulse Rate: 69 (05/21 1805)  Labs:  Recent Labs  07/05/14 1243 07/05/14 1620 07/06/14 0917 07/06/14 1632  WBC 11.7*  --   --  8.1  HGB 14.9  --   --  13.0  PLT 252  --   --  229  CREATININE 1.75* 1.50* 1.24  --    Estimated Creatinine Clearance: 67 mL/min (by C-G formula based on Cr of 1.24).  Microbiology: No results found for this or any previous visit (from the past 720 hour(s)).  Medical/Surgical History: Past Medical History  Diagnosis Date  . Rheumatoid arthritis(714.0)   . Obesity   . Major depression     with acute psychotic break in 06/2010  . Hypertension   . Hyperlipidemia   . Diverticulosis of colon   . COPD (chronic obstructive pulmonary disease)   . Anxiety   . GERD (gastroesophageal reflux disease)   . Vertigo   . Fibromyalgia   . Dermatomyositis   . Myocardial infarct     mulitple (1999, 2000, 2004)  . Raynaud's disease   . Narcotic dependence   . Peripheral neuropathy   . Internal hemorrhoids   . Ischemic heart disease   . Hiatal hernia   . Gastritis   . Diverticulitis   . Hx of adenomatous colonic polyps   . Nephrolithiasis   . Anemia   . Esophageal stricture   . Esophageal dysmotility   . Dermatomyositis   . Urge incontinence   . Otosclerosis   . Bipolar 1 disorder   . OCD (obsessive compulsive disorder)   . Sarcoidosis   . Paroxysmal a-fib   . Dysrhythmia     "irregular" (11/15/2012)  . Type II diabetes mellitus   . Seizures   . Headache(784.0)     "severe; get them often" (11/15/2012)  . Subarachnoid hemorrhage 01/2012    with subdural  hematoma.   . Atrial fibrillation 01/2012    with RVR  . Conversion disorder 06/2010  . History of narcotic addiction   .  Asthma   . Cancer   . Collagen vascular disease   . Renal insufficiency    Past Surgical History  Procedure Laterality Date  . Knee arthroscopy w/ meniscal repair Left 2009  . Lumbar disc surgery    . Squamous papilloma   2010    removed by Dr. Constance Holster ENT, noted on tongue  . Esophagogastroduodenoscopy N/A 09/27/2012    Procedure: ESOPHAGOGASTRODUODENOSCOPY (EGD);  Surgeon: Lafayette Dragon, MD;  Location: [facility];  Service: Endoscopy;  Laterality: N/A;  . Colonoscopy N/A 09/27/2012    Procedure: COLONOSCOPY;  Surgeon: Lafayette Dragon, MD;  Location: [facility];  Service: Endoscopy;  Laterality: N/A;  . Cataract extraction w/ intraocular lens implant Left   . Lymph node dissection Right 1970's    "neck; dr thought I had Hodgkins; turned out to be sarcoidosis" (11/15/2012)  . Tonsillectomy    .  Carpal tunnel release Bilateral   . Cardiac catheterization    . Back surgery      Current Medication[s] Include: Prior to Admission: Prescriptions prior to admission  Medication Sig Dispense Refill Last Dose  . albuterol-ipratropium (COMBIVENT) 18-103 MCG/ACT inhaler Inhale 1 puff into the lungs daily. 14.7 g 5 Past Week at Unknown time  . ALPRAZolam (XANAX) 1 MG tablet Take 1 tablet (1 mg total) by mouth 2 (two) times daily as needed for anxiety. 30 tablet 0 Past Week at Unknown time  . amiodarone (PACERONE) 200 MG tablet Take 1 tablet (200 mg total) by mouth daily. 30 tablet 0 Past Week at Unknown time  . aspirin EC 81 MG tablet Take 81 mg by mouth daily.   07/05/2014 at Unknown time  . atorvastatin (LIPITOR) 40 MG tablet TAKE 1 TABLET BY MOUTH DAILY 90 tablet 1 Past Week at Unknown time  . divalproex (DEPAKOTE ER) 500 MG 24 hr tablet Take 1 tablet (500 mg total) by mouth daily. 30 tablet 0 Past Week at Unknown time  . levETIRAcetam (KEPPRA) 500 MG tablet Take 1 tablet (500 mg total) by mouth 2 (two) times daily. 60 tablet 0 Past Week at Unknown time  . Multiple Vitamin (MULTIVITAMIN WITH MINERALS) TABS tablet Take 1 tablet by mouth daily. 90 tablet 3 Past Week at Unknown time  . NEXIUM 40 MG capsule TAKE ONE CAPSULE BY MOUTH EVERY MORNING WITH BREAKFAST 90 capsule 3 Past Week at Unknown time  . oxybutynin (DITROPAN) 5 MG tablet Take 1 tablet (5 mg total) by mouth 3 (three) times daily. 90 tablet 0 Past Week at Unknown time  . polyethylene glycol powder (GLYCOLAX/MIRALAX) powder Take 17 g by mouth daily. 3350 g 1 Past Week at Unknown time  . potassium chloride SA (K-DUR,KLOR-CON) 20 MEQ tablet Take 1 tablet (20 mEq total) by mouth daily. 30 tablet 0 Past Week at Unknown time  . rOPINIRole (REQUIP) 2 MG tablet Take 1 tablet (2 mg total) by mouth 3 (three) times daily. 90 tablet 0 Past Week at Unknown time  . ondansetron (ZOFRAN ODT) 4 MG disintegrating tablet 4mg  ODT q4 hours prn nausea/vomit  (Patient not taking: Reported on 03/29/2014) 4 tablet 0 Completed Course at Unknown time   Scheduled:  Scheduled:  . aspirin EC  81 mg Oral Daily  . cefTRIAXone (ROCEPHIN)  IV  1 g Intravenous Once  . folic acid  1 mg Intravenous Daily  . heparin  5,000 Units Subcutaneous 3 times per day  . levETIRAcetam  500 mg Oral BID  . sodium chloride  1,000 mL Intravenous Q1H  Followed by  . sodium chloride  125 mL Intravenous Q1H  . sodium chloride  3 mL Intravenous Q12H  . thiamine  100 mg Intravenous Daily  . vancomycin (VANCOCIN) IVPB  1,500 mg Intravenous NOW  . [START ON 07/07/2014] vancomycin (VANCOCIN) 750 mg IVPB  750 mg Intravenous Q12H   Infusion[s]: Infusions:   Antibiotic[s]: Anti-infectives    Start     Dose/Rate Route Frequency Ordered Stop   07/07/14 0800  vancomycin (VANCOCIN) 750 mg in sodium chloride 0.9 % 150 mL IVPB     750 mg 150 mL/hr over 60 Minutes Intravenous Every 12 hours 07/06/14 1833     07/06/14 1845  vancomycin (VANCOCIN) 1,500 mg in sodium chloride 0.9 % 500 mL IVPB     1,500 mg 250 mL/hr over 120 Minutes Intravenous NOW 07/06/14 1832 07/07/14 1845   07/06/14 1845  cefTRIAXone (ROCEPHIN) 1 g in dextrose 5 % 50 mL IVPB - Premix    Comments:  Cellulitis and possible CAP   1 g 100 mL/hr over 30 Minutes Intravenous  Once 07/06/14 1835     07/06/14 1815  piperacillin-tazobactam (ZOSYN) IVPB 3.375 g  Status:  Discontinued     3.375 g 100 mL/hr over 30 Minutes Intravenous  Once 07/06/14 1802 07/06/14 1835   07/06/14 1815  vancomycin (VANCOCIN) IVPB 1000 mg/200 mL premix  Status:  Discontinued     1,000 mg 200 mL/hr over 60 Minutes Intravenous  Once 07/06/14 1802 07/06/14 1831   07/06/14 1445  cefTRIAXone (ROCEPHIN) 1 g in dextrose 5 % 50 mL IVPB     1 g 100 mL/hr over 30 Minutes Intravenous  Once 07/06/14 1439 07/06/14 1551   07/06/14 1445  azithromycin (ZITHROMAX) 500 mg in dextrose 5 % 250 mL IVPB     500 mg 250 mL/hr over 60 Minutes Intravenous  Once  07/06/14 1439 07/06/14 1654      Assessment:  64 y/o male with an extensive medical and surgical history admitted initially for AMS.  Picture worrisome for sepsis but now, per discussion with Dr. Berkley Harvey, patient's mental status changes most likely due to oversedation at home.  Current blood gases much improved.  Patient relates severe allergic reaction to Penicillins with swelling tongue and difficulty breathing.  Zosyn will be Discontinued, but Vancomycin to be continued for possible MRSA cellulitis.  Patient weight 107 kg, Est CrCl ~ 67 ml/min  Goal of Therapy:  Vancomycin trough level 10-15 mcg/ml  Plan:  1. Change Vancomycin to 1500 mg IV load, then 2. Begin Vancomycin 750 mg IV q 12 hours. 3. Monitor renal function, WBC, fever curve, any cultures/sensitivities, Vancomycin levels as clinically indicated, and clinical progression.  Patrick Holt, Craig Guess,  Pharm.D,    5/21/20166:35 PM

## 2014-07-06 NOTE — ED Notes (Signed)
Ativan given at 2315.  Resting quietly at this time with no distress noted.  BP wnl as well as pulse at this time. To continue to monitor.

## 2014-07-06 NOTE — ED Provider Notes (Addendum)
  Physical Exam  BP 137/107 mmHg  Pulse 84  Temp(Src) 97.3 F (36.3 C) (Axillary)  Resp 19  SpO2 95%  Physical Exam  ED Course  Procedures  MDM Patient here with AMS at home. Psychotic presently. Labs showed hypokalemia yesterday, given K runs. Also Cr elevated. Will repeat chemistry this AM. Pending geri psych placement.   12:07 PM Repeat K 3.3. Cr improved. Will continue to monitor.   2:40 PM I was called by the nursing stating that patient hypoxic. Upon chart review, hypoxic on arrival. It is 87% RA this afternoon. Not on O2 at home. Has low grade temp 99. Has subjective chills and cough. CT angio showed mucous R lower lobe. Also has R forearm cellulitis and the nurse states that EMS placed IV R forearm. On exam, obvious cellulitis, no abscess. Given ceftriaxone/azithro for pneumonia, which can cover for cellulitis as well. Will admit to family practice.   Wandra Arthurs, MD 07/06/14 3143  Wandra Arthurs, MD 07/06/14 8887  Wandra Arthurs, MD 07/06/14 [phone]
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
## 2014-07-06 NOTE — ED Notes (Signed)
ADMITTING MD W/PT.

## 2014-07-06 NOTE — ED Notes (Signed)
Consulting MD at bedside

## 2014-07-07 DIAGNOSIS — F23 Brief psychotic disorder: Secondary | ICD-10-CM | POA: Insufficient documentation

## 2014-07-07 DIAGNOSIS — Z9114 Patient's other noncompliance with medication regimen: Secondary | ICD-10-CM

## 2014-07-07 DIAGNOSIS — M6282 Rhabdomyolysis: Secondary | ICD-10-CM | POA: Insufficient documentation

## 2014-07-07 DIAGNOSIS — F209 Schizophrenia, unspecified: Secondary | ICD-10-CM

## 2014-07-07 DIAGNOSIS — F250 Schizoaffective disorder, bipolar type: Secondary | ICD-10-CM

## 2014-07-07 DIAGNOSIS — F259 Schizoaffective disorder, unspecified: Secondary | ICD-10-CM

## 2014-07-07 DIAGNOSIS — T796XXA Traumatic ischemia of muscle, initial encounter: Secondary | ICD-10-CM

## 2014-07-07 DIAGNOSIS — R4182 Altered mental status, unspecified: Secondary | ICD-10-CM

## 2014-07-07 DIAGNOSIS — F22 Delusional disorders: Secondary | ICD-10-CM

## 2014-07-07 DIAGNOSIS — N179 Acute kidney failure, unspecified: Secondary | ICD-10-CM | POA: Insufficient documentation

## 2014-07-07 DIAGNOSIS — L03113 Cellulitis of right upper limb: Principal | ICD-10-CM | POA: Insufficient documentation

## 2014-07-07 LAB — CLOSTRIDIUM DIFFICILE BY PCR: Toxigenic C. Difficile by PCR: NEGATIVE

## 2014-07-07 LAB — CBC
HCT: 42.5 % (ref 39.0–52.0)
HEMOGLOBIN: 13.3 g/dL (ref 13.0–17.0)
MCH: 28.4 pg (ref 26.0–34.0)
MCHC: 31.3 g/dL (ref 30.0–36.0)
MCV: 90.8 fL (ref 78.0–100.0)
Platelets: 243 10*3/uL (ref 150–400)
RBC: 4.68 MIL/uL (ref 4.22–5.81)
RDW: 15.7 % — ABNORMAL HIGH (ref 11.5–15.5)
WBC: 9.4 10*3/uL (ref 4.0–10.5)

## 2014-07-07 LAB — CK: CK TOTAL: 1573 U/L — AB (ref 49–397)

## 2014-07-07 LAB — BASIC METABOLIC PANEL
Anion gap: 8 (ref 5–15)
BUN: 10 mg/dL (ref 6–20)
CALCIUM: 8.7 mg/dL — AB (ref 8.9–10.3)
CHLORIDE: 108 mmol/L (ref 101–111)
CO2: 29 mmol/L (ref 22–32)
Creatinine, Ser: 0.98 mg/dL (ref 0.61–1.24)
GFR calc Af Amer: >60 mL/min (ref >60)
GFR calc non Af Amer: >60 mL/min (ref >60)
GLUCOSE: 82 mg/dL (ref 65–99)
POTASSIUM: 3.5 mmol/L (ref 3.5–5.1)
SODIUM: 145 mmol/L (ref 135–145)

## 2014-07-07 LAB — MRSA PCR SCREENING: MRSA by PCR: NEGATIVE

## 2014-07-07 MED ORDER — SODIUM CHLORIDE 0.9 % IV SOLN
Freq: Once | INTRAVENOUS | Status: AC
  Administered 2014-07-07: 08:00:00 via INTRAVENOUS

## 2014-07-07 MED ORDER — RISPERIDONE 1 MG PO TABS
1.0000 mg | ORAL_TABLET | Freq: Two times a day (BID) | ORAL | Status: DC
  Administered 2014-07-07 – 2014-07-16 (×18): 1 mg via ORAL
  Filled 2014-07-07 (×19): qty 1

## 2014-07-07 MED ORDER — DOXYCYCLINE HYCLATE 100 MG PO TABS
100.0000 mg | ORAL_TABLET | Freq: Two times a day (BID) | ORAL | Status: DC
  Administered 2014-07-07 – 2014-07-16 (×19): 100 mg via ORAL
  Filled 2014-07-07 (×20): qty 1

## 2014-07-07 MED ORDER — DIVALPROEX SODIUM ER 500 MG PO TB24
500.0000 mg | ORAL_TABLET | Freq: Every day | ORAL | Status: DC
  Administered 2014-07-07 – 2014-07-14 (×8): 500 mg via ORAL
  Filled 2014-07-07 (×9): qty 1

## 2014-07-07 MED ORDER — SODIUM CHLORIDE 0.9 % IV SOLN
INTRAVENOUS | Status: DC
  Administered 2014-07-07 – 2014-07-08 (×2): via INTRAVENOUS
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
## 2014-07-07 NOTE — Progress Notes (Signed)
Family Medicine Teaching Service Daily Progress Note Intern Pager: [phone]  Patient name: Patrick Holt Medical record number: [id number] Date of birth: [birth year] Age: 64 y.o. Gender: male  Primary Care Provider: Howard Pouch, DO Consultants: psych Code Status: full  Pt Overview and Major Events to Date:  5/20: ED for Acute Psychosis, AKI 5/21: Admitted for Cellulitis and somnolence 5/22: Somnolence cleared. Consulting Psych for delusions  Assessment and Plan: Patrick RIMEL is a 65 y.o. male presenting with AMS and Cellulitis. PMH is significant for CAD s/p MI, AFib (no anticoagulation), seizures, HTN, active smoker, DM2 (diet controlled), COPD, dermatomyotisis, and h/o psychotic episodes   # Delusions: Somnolence Resolved.  Active hallucinations on when presented to ED 5/20; called for admission due to decreased level of alertness 5/21. Prior admissions for psychotic behaviors and medication noncompliance which I suspect is contributing to current admission. However, acute worsening of consciousness with active infection and right forearm also contributing; sedation, also likely due to polypharmacy and reinitiating multiple medications which I suspect he has been compliant with her home. CT head for acute pathology 5/20. ABG, CBC and BMET unrevealing. BP wnl. EKG & Trop: wnl. TSH wnl. hypoxia resolved   - IVF as below - Requip: Reduced dose as below. - Abx as below  - Psych consult as below - Depakote level: Sub-theraputic. Suspect noncompliance   # Infection: Cellulitis +/- PNA: Right forearm cellulitis with bullae at site of IV infiltration. CT angio shows bibasilar opacities concerning for atelectasis versus pneumonia. WBC wnl. ABG wnl. Low suspicion for sepsis however giving AMS . We'll treat with broad-spectrum as below and narrow once mentation improves.  - Check Lactic acid - Recheck UA and Culture Urine - BCx: ordered  - Doxy: Abx Day 2 / 7 (5/22>>);  Vanc/CTX: (5/21>>5/21)   # AKI - Resolving. Cr 1.75 (5/20, admit) Baseline Cr 1.1 - Cr 1.24 (5/21); Continue to follow with IVF as below -  CK elevated 2900. IVF as below. Possible due to being found down vs Hx of dermatomyositis.  - Recheck CK @ 5pm 5/22  # Psych: Bipolar, mixed and Chronic Paranoid Schizophrenia. Multiple prior admissions for psychotic behaviors but has never ultimately been hospitalized at a psychiatric facility due to lack of placement and patient will regain capacity and requests discharge. He is unwilling to be followed by psychiatry and has been fairly well controlled on Depakote, Xanax and Keppra. - Continue home Depakote ER 500mg ; Keppra 500mg  BID - Holding home Xanax - Psych consulted   # CV: (Hx of HTN, HLD, CAD, MI, A Fib on amiodarone. Followed by Dr. 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 Echo 03/2014 EF 50% w/ mild LVH - Continue home Amiodarone 200mg  qd, ASA 81mg ; Lipitor 40mg  qhs  # H/o parkinson's symptoms - Decreased Requip dose: 2mg  BID as suspect noncompliance at home. - PT eval  Chronic Problems # GI (Multiple issues with prior polyps, diverticulosis with recurrent diverticulitis, esophageal dysmotility, strictures and reflux, IBS) - Continue Home PPI # COPD: Continue home Combivent # Hx of Dermatomyositis - Needs to re-establish with Rheumatology  # Urinary incontinence: Holding Oxybutynin: (Home dose 5mg  TID)   FEN/GI: Heart healthy diet; NS @ 100 Prophylaxis: Subq Heparin  Subjective:  Reports trouble seeing / burry vision; he reports this is not new, but he missed his eye apt due to being in hospital.   Objective: Temp:  [98.2  F (36.8 C)-99.8 F (37.7 C)] 98.5 F (36.9 C) (05/22 0747) Pulse Rate:  [48-134] 65 (05/22 0747) Resp:  [14-22] 15 (05/22 0747) BP: (109-138)/(60-78) 130/78 mmHg (05/22  0747) SpO2:  [87 %-99 %] 96 % (05/22 0747) Weight:  [236 lb 8.9 oz (107.3 kg)] 236 lb 8.9 oz (107.3 kg) (05/21 1805) Physical Exam: General:NAD; conversational  Eyes: PERRL; Dec vision in right ey ENTM: MM mildly dry;  Cardiovascular: RRR, no M/R/G; radial pulses 2+ bilaterally; Cap refill < 3 sec  Respiratory: Decreased breath sounds bilaterally; Normal effort GI: SNTND; BS + Skin: Right forearm dressing in place Neuro: A&Ox3;  Psych: Active delusion of being shot  Laboratory: Results for orders placed or performed during the hospital encounter of 07/05/14 (from the past 24 hour(s))  Basic metabolic panel     Status: Abnormal   Collection Time: 07/06/14  9:17 AM  Result Value Ref Range   Sodium 143 135 - 145 mmol/L   Potassium 3.3 (L) 3.5 - 5.1 mmol/L   Chloride 108 101 - 111 mmol/L   CO2 24 22 - 32 mmol/L   Glucose, Bld 115 (H) 65 - 99 mg/dL   BUN 13 6 - 20 mg/dL   Creatinine, Ser 1.24 0.61 - 1.24 mg/dL   Calcium 8.9 8.9 - 10.3 mg/dL   GFR calc non Af Amer 59 (L) >60 mL/min   GFR calc Af Amer >60 >60 mL/min   Anion gap 11 5 - 15  Lactic acid, plasma     Status: None   Collection Time: 07/06/14  4:32 PM  Result Value Ref Range   Lactic Acid, Venous 0.9 0.5 - 2.0 mmol/L  CBC with Differential     Status: None   Collection Time: 07/06/14  4:32 PM  Result Value Ref Range   WBC 8.1 4.0 - 10.5 K/uL   RBC 4.54 4.22 - 5.81 MIL/uL   Hemoglobin 13.0 13.0 - 17.0 g/dL   HCT 40.8 39.0 - 52.0 %   MCV 89.9 78.0 - 100.0 fL   MCH 28.6 26.0 - 34.0 pg   MCHC 31.9 30.0 - 36.0 g/dL   RDW 15.4 11.5 - 15.5 %   Platelets 229 150 - 400 K/uL   Neutrophils Relative % 75 43 - 77 %   Neutro Abs 6.0 1.7 - 7.7 K/uL   Lymphocytes Relative 14 12 - 46 %   Lymphs Abs 1.2 0.7 - 4.0 K/uL   Monocytes Relative 11 3 - 12 %   Monocytes Absolute 0.9 0.1 - 1.0 K/uL   Eosinophils Relative 0 0 - 5 %   Eosinophils Absolute 0.0 0.0 - 0.7 K/uL   Basophils Relative 0 0 - 1 %   Basophils Absolute 0.0 0.0 -  0.1 K/uL  I-Stat arterial blood gas, ED     Status: Abnormal   Collection Time: 07/06/14  4:44 PM  Result Value Ref Range   pH, Arterial 7.320 (L) 7.350 - 7.450   pCO2 arterial 49.4 (H) 35.0 - 45.0 mmHg   pO2, Arterial 79.0 (L) 80.0 - 100.0 mmHg   Bicarbonate 25.3 (H) 20.0 - 24.0 mEq/L   TCO2 27 0 - 100 mmol/L   O2 Saturation 94.0 %   Acid-base deficit 1.0 0.0 - 2.0 mmol/L   Patient temperature 99.8 F    Collection site RADIAL, ALLEN'S TEST ACCEPTABLE    Drawn by RT    Sample type ARTERIAL   Lactic acid, plasma     Status: None   Collection Time:  07/06/14  6:43 PM  Result Value Ref Range   Lactic Acid, Venous 0.9 0.5 - 2.0 mmol/L  Protime-INR     Status: None   Collection Time: 07/06/14  6:43 PM  Result Value Ref Range   Prothrombin Time 14.4 11.6 - 15.2 seconds   INR 1.10 0.00 - 1.49  APTT     Status: None   Collection Time: 07/06/14  6:43 PM  Result Value Ref Range   aPTT 34 24 - 37 seconds  TSH     Status: None   Collection Time: 07/06/14  6:43 PM  Result Value Ref Range   TSH 0.463 0.350 - 4.500 uIU/mL  CK     Status: Abnormal   Collection Time: 07/06/14  6:43 PM  Result Value Ref Range   Total CK 2909 (H) 49 - 397 U/L  Valproic acid level     Status: Abnormal   Collection Time: 07/06/14  6:43 PM  Result Value Ref Range   Valproic Acid Lvl 26 (L) 50.0 - 100.0 ug/mL  Ammonia     Status: Abnormal   Collection Time: 07/06/14  6:43 PM  Result Value Ref Range   Ammonia 57 (H) 9 - 35 umol/L  Troponin I     Status: None   Collection Time: 07/06/14  6:43 PM  Result Value Ref Range   Troponin I <0.03 <0.031 ng/mL  Urine culture *Canceled*     Status: None ()   Collection Time: 07/06/14  7:00 PM   Narrative   LIS Cancel (ORR/DE = Data Error)  Urinalysis, Routine w reflex microscopic *Canceled*     Status: None ()   Collection Time: 07/06/14  7:00 PM   Narrative   LIS Cancel (ORR/DE = Data Error)  MRSA PCR Screening     Status: None   Collection Time: 07/06/14  7:47  PM  Result Value Ref Range   MRSA by PCR NEGATIVE NEGATIVE  Basic metabolic panel     Status: Abnormal   Collection Time: 07/07/14  3:49 AM  Result Value Ref Range   Sodium 145 135 - 145 mmol/L   Potassium 3.5 3.5 - 5.1 mmol/L   Chloride 108 101 - 111 mmol/L   CO2 29 22 - 32 mmol/L   Glucose, Bld 82 65 - 99 mg/dL   BUN 10 6 - 20 mg/dL   Creatinine, Ser 0.98 0.61 - 1.24 mg/dL   Calcium 8.7 (L) 8.9 - 10.3 mg/dL   GFR calc non Af Amer >60 >60 mL/min   GFR calc Af Amer >60 >60 mL/min   Anion gap 8 5 - 15  CBC     Status: Abnormal   Collection Time: 07/07/14  3:49 AM  Result Value Ref Range   WBC 9.4 4.0 - 10.5 K/uL   RBC 4.68 4.22 - 5.81 MIL/uL   Hemoglobin 13.3 13.0 - 17.0 g/dL   HCT 42.5 39.0 - 52.0 %   MCV 90.8 78.0 - 100.0 fL   MCH 28.4 26.0 - 34.0 pg   MCHC 31.3 30.0 - 36.0 g/dL   RDW 15.7 (H) 11.5 - 15.5 %   Platelets 243 150 - 400 K/uL   Imaging/Diagnostic Tests: CT Head 5/20:  No CT evidence of acute intracranial abnormality. Re- demonstration of chronic white matter disease. Associated intracranial atherosclerosis.  CT Angio 5/21 IMPRESSION: No evidence for pulmonary embolism.  Cystic lesion off the superior pole of the right kidney. In the interval there has been development of high attenuation material  along the posterior aspect of the cystic lesion. This may represent high attenuation proteinaceous debris however soft tissue mass is not excluded. Recommend correlation with pre and post contrast-enhanced MRI or CT in the non acute setting.  Endoluminal material within the lateral aspect of the distal trachea and right lower lobe bronchus, favored to represent mucus. Consider attention on short-term followup chest CT or further evaluation with direct visualization as clinically indicated.  Bilateral lower lobe subpleural consolidative opacities favored to represent atelectasis.  Olam Idler, MD 07/07/2014, 8:12 AM PGY-2, [place] Intern pager: [phone], text pages welcome

## 2014-07-08 ENCOUNTER — Inpatient Hospital Stay (HOSPITAL_COMMUNITY): Payer: Commercial Managed Care - HMO

## 2014-07-08 DIAGNOSIS — M6282 Rhabdomyolysis: Secondary | ICD-10-CM

## 2014-07-08 LAB — CBC
HCT: 39.1 % (ref 39.0–52.0)
Hemoglobin: 12.4 g/dL — ABNORMAL LOW (ref 13.0–17.0)
MCH: 28.8 pg (ref 26.0–34.0)
MCHC: 31.7 g/dL (ref 30.0–36.0)
MCV: 90.7 fL (ref 78.0–100.0)
Platelets: 239 10*3/uL (ref 150–400)
RBC: 4.31 MIL/uL (ref 4.22–5.81)
RDW: 15.5 % (ref 11.5–15.5)
WBC: 7.1 10*3/uL (ref 4.0–10.5)

## 2014-07-08 LAB — BASIC METABOLIC PANEL
Anion gap: 8 (ref 5–15)
BUN: 14 mg/dL (ref 6–20)
CO2: 25 mmol/L (ref 22–32)
CREATININE: 0.98 mg/dL (ref 0.61–1.24)
Calcium: 8.3 mg/dL — ABNORMAL LOW (ref 8.9–10.3)
Chloride: 111 mmol/L (ref 101–111)
GFR calc Af Amer: >60 mL/min (ref >60)
GLUCOSE: 93 mg/dL (ref 65–99)
POTASSIUM: 3.1 mmol/L — AB (ref 3.5–5.1)
Sodium: 144 mmol/L (ref 135–145)

## 2014-07-08 LAB — GLUCOSE, CAPILLARY: Glucose-Capillary: 94 mg/dL (ref 65–99)

## 2014-07-08 MED ORDER — ROPINIROLE HCL 1 MG PO TABS
2.0000 mg | ORAL_TABLET | Freq: Two times a day (BID) | ORAL | Status: AC
  Administered 2014-07-08 (×2): 2 mg via ORAL
  Filled 2014-07-08 (×2): qty 2

## 2014-07-08 MED ORDER — POTASSIUM CHLORIDE CRYS ER 20 MEQ PO TBCR
40.0000 meq | EXTENDED_RELEASE_TABLET | Freq: Two times a day (BID) | ORAL | Status: AC
  Administered 2014-07-08 (×2): 40 meq via ORAL
  Filled 2014-07-08 (×2): qty 2

## 2014-07-08 MED ORDER — ROPINIROLE HCL 1 MG PO TABS
2.0000 mg | ORAL_TABLET | Freq: Every day | ORAL | Status: DC
  Filled 2014-07-08: qty 2

## 2014-07-08 MED ORDER — ROPINIROLE HCL 1 MG PO TABS
2.0000 mg | ORAL_TABLET | Freq: Three times a day (TID) | ORAL | Status: DC
  Filled 2014-07-08 (×2): qty 2

## 2014-07-08 NOTE — Progress Notes (Signed)
Family Medicine Teaching Service Daily Progress Note Intern Pager: [phone]  Patient name: Patrick Holt Medical record number: [id number] Date of birth: 09/27/1950 Age: 64 y.o. Gender: male  Primary Care Provider: Howard Pouch, DO Consultants: psych Code Status: full  Pt Overview and Major Events to Date:  5/20: ED for Acute Psychosis, AKI 5/21: Admitted for Cellulitis and somnolence 5/22: Somnolence cleared. Consulting Psych for delusions  Assessment and Plan: JADYN BARGE is a 64 y.o. male presenting with AMS and Cellulitis. PMH is significant for CAD s/p MI, AFib (no anticoagulation), seizures, HTN, active smoker, DM2 (diet controlled), COPD, dermatomyotisis, and h/o psychotic episodes   # Delusions: Somnolence Resolved.  Active hallucinations when presented to ED 5/20; called for admission due to decreased level of alertness 5/21. Prior admissions for psychotic behaviors and medication noncompliance. Acute worsening of consciousness with active infection and right forearm also contributing. Also a component also likely due to polypharmacy and reinitiating multiple medications.. CT head for acute pathology 5/20. ABG, CBC and BMET unrevealing. BP wnl. EKG & Trop: wnl. TSH wnl. UDS positive for benzos. hypoxia resolved   - IVF as below - Abx as below  - Psych consult as below - Requip: Reduced dose, consider tapering off.  - Depakote level: Sub-theraputic. Suspect noncompliance   # Infection: Cellulitis +/- PNA: Right forearm cellulitis with bullae at site of IV infiltration. CT angio shows bibasilar opacities concerning for atelectasis versus pneumonia. WBC wnl. ABG wnl. Low suspicion for sepsis however giving AMS. Lactic acid wnl.  - Doxy: (5/22>>); Vanc/CTX: (5/21>>5/21)  - BCx: NGTD   # AKI. Cr 1.75 (5/20, admit) Baseline Cr 1.1. CK also noted to be mildly elevated to 2900. Resolved with IVF.  - Continue IVF - Trend BMP, CK  # Psych: Bipolar, mixed and  Chronic Paranoid Schizophrenia. Multiple prior admissions for psychotic behaviors but has never ultimately been hospitalized at a psychiatric facility due to lack of placement and patient will regain capacity and requests discharge. He is unwilling to be followed by psychiatry and has been fairly well controlled on Depakote, Xanax and Keppra. - Continue home Depakote ER 500mg ; Keppra 500mg  BID - Holding home Xanax - Psych consulted   # CV: (Hx of HTN, HLD, CAD, MI, A Fib on amiodarone. Followed by Dr. 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 Echo 03/2014 EF 50% w/ mild LVH - Continue home Amiodarone 200mg  qd, ASA 81mg ; Lipitor 40mg  qhs  # H/o parkinson's symptoms - Decreased Requip dose: 2mg  BID as suspect noncompliance at home, considering tapering off - PT eval  Chronic Problems # GI (Multiple issues with prior polyps, diverticulosis with recurrent diverticulitis, esophageal dysmotility, strictures and reflux, IBS) - Continue Home PPI # COPD: Continue home Combivent # Hx of Dermatomyositis - Needs to re-establish with Rheumatology  # Urinary incontinence: Holding Oxybutynin: (Home dose 5mg  TID)   FEN/GI: Heart healthy diet; NS @ 100 Prophylaxis: Subq Heparin  Dispo: Medically clear for inpatient psych when bed available.   Subjective:  No new complaints this morning. Alert and oriented x3. No apparent delusions or hallucinations  Objective: Temp:  [98.3 F (36.8 C)-99.5 F (37.5 C)] 98.3 F (36.8 C) (05/23 0434) Pulse Rate:  [65-76] 70 (05/22 2335) Resp:  [13-17] 17 (05/22 2335) BP: (130-159)/(63-86) 151/63 mmHg (05/22 2335) SpO2:  [92 %-96 %] 92 % (05/22 2335) Physical Exam: General:NAD; conversational  Cardiovascular: RRR, no murmursradial pulses  2+ bilaterally;  Respiratory: Decreased breath sounds bilaterally; Normal effort GI: SNTND; BS  + Skin: Right forearm erythema and warmth with central bullae, improving Neuro: A&Ox3;  Psych: Blunted affect, no apparent   Laboratory/imaging:  Recent Labs Lab 07/06/14 1632 07/07/14 0349 07/08/14 0225  HGB 13.0 13.3 12.4*  HCT 40.8 42.5 39.1  WBC 8.1 9.4 7.1  PLT 229 243 239    Recent Labs Lab 07/05/14 1243 07/05/14 1620 07/06/14 0917 07/07/14 0349 07/08/14 0225  NA 141 144 143 145 144  K 3.1* 2.8* 3.3* 3.5 3.1*  CL 105 109 108 108 111  CO2 24 23 24 29 25   GLUCOSE 70 87 115* 82 93  BUN 19 17 13 10 14   CREATININE 1.75* 1.50* 1.24 0.98 0.98  CALCIUM 9.4 8.7* 8.9 8.7* 8.3*   CK: 2909 > 1573  Vivi Barrack, MD 07/08/2014, 7:00 AM PGY-1, [place] Intern pager: [phone], text pages welcome

## 2014-07-08 NOTE — Clinical Social Work Psych Note (Signed)
Psych CSW received consult for hallucinations and possible inpatient psychiatric admission.  Psych CSW now following.  Nonnie Done, LCSW [phone]  Psychiatric & Orthopedics (5N 1-8) Clinical Social Worker
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
## 2014-07-08 NOTE — Evaluation (Signed)
Physical Therapy Evaluation Patient Details Name: Patrick Holt MRN: [id number] DOB: [birth year] Today's Date: 07/08/2014   History of Present Illness  Patient is a 64 y/o male admitted with AMS and Cellulitis. PMH is significant for CAD s/p MI, AFib (no anticoagulation), seizures, HTN, active smoker, DM2 (diet controlled), COPD, dermatomyotisis, and h/o psychotic episodes   Clinical Impression  Patient presents with decreased independence with mobility due to deficits listed in PT problem list below.  He will benefit from skilled PT in the acute setting to minimize burden of care in next venue.  Will need SNF level rehab at d/c at pt lives alone and unable to care for himself currently.    Follow Up Recommendations SNF    Equipment Recommendations  None recommended by PT    Recommendations for Other Services       Precautions / Restrictions Precautions Precautions: Fall Precaution Comments: condom cath, diarrhea/incontinence      Mobility  Bed Mobility Overal bed mobility: Needs Assistance Bed Mobility: Supine to Sit     Supine to sit: Min guard;HOB elevated     General bed mobility comments: use of bed rail to pull up  Transfers Overall transfer level: Needs assistance Equipment used: Rolling walker (2 wheeled) Transfers: Sit to/from Stand Sit to Stand: Min assist         General transfer comment: assist for lift off from bed, pt leaning forward and needed cues to wait for environmental set up.  Ambulation/Gait Ambulation/Gait assistance: Min guard Ambulation Distance (Feet): 5 Feet Assistive device: Rolling walker (2 wheeled) Gait Pattern/deviations: Step-through pattern;Trunk flexed;Shuffle;Decreased stride length     General Gait Details: distance limited due to nursing report of fecal incontinence with diarrhea today and due to patient eating lunch and wanting assist for better set up; noted flexed posture and shuffling steps  Stairs             Wheelchair Mobility    Modified Rankin (Stroke Patients Only)       Balance Overall balance assessment: Needs assistance Sitting-balance support: Feet supported Sitting balance-Leahy Scale: Fair       Standing balance-Leahy Scale: Poor Standing balance comment: imbalance on feet requiring assist versus UE support for safety                             Pertinent Vitals/Pain Pain Assessment: No/denies pain    Home Living Family/patient expects to be discharged to:: Skilled nursing facility Living Arrangements: Alone   Type of Home: Apartment Home Access: Ramped entrance     Home Layout: One level Home Equipment: Cane - single point;Wheelchair - power Additional Comments: patient distracted and not answering questions regarding home environment above noted from previous admission 6 months ago.    Prior Function                 Hand Dominance        Extremity/Trunk Assessment               Lower Extremity Assessment: Generalized weakness      Cervical / Trunk Assessment: Kyphotic  Communication   Communication: No difficulties  Cognition Arousal/Alertness: Awake/alert Behavior During Therapy: Flat affect Overall Cognitive Status: Impaired/Different from baseline Area of Impairment: Attention;Following commands;Problem solving;Memory;Orientation Orientation Level:  (knew day and date due to referring to calendar in room) Current Attention Level: Focused Memory: Decreased short-term memory Following Commands: Follows one step commands with increased time  Problem Solving: Slow processing;Decreased initiation General Comments: patient calling nurse in to help with "these rehab people"  Adjusted meal tray after in chair twice for pt to reach, still pushes it away though attempting to eat    General Comments      Exercises        Assessment/Plan    PT Assessment Patient needs continued PT services  PT Diagnosis  Generalized weakness;Abnormality of gait   PT Problem List Decreased strength;Decreased mobility;Decreased activity tolerance;Decreased balance;Decreased knowledge of use of DME;Decreased cognition;Decreased safety awareness  PT Treatment Interventions DME instruction;Therapeutic exercise;Gait training;Balance training;Functional mobility training;Therapeutic activities;Patient/family education   PT Goals (Current goals can be found in the Care Plan section) Acute Rehab PT Goals Patient Stated Goal: Did not state, seems agreeable to skilled rehab PT Goal Formulation: With patient Time For Goal Achievement: 07/22/14 Potential to Achieve Goals: Fair    Frequency Min 3X/week   Barriers to discharge Decreased caregiver support      Co-evaluation               End of Session Equipment Utilized During Treatment: Gait belt Activity Tolerance: Patient tolerated treatment well Patient left: in chair;with call bell/phone within reach Nurse Communication: Mobility status         Time: 7579-7282 PT Time Calculation (min) (ACUTE ONLY): 20 min   Charges:   PT Evaluation $Initial PT Evaluation Tier I: 1 Procedure     PT G Codes:        Annelle Behrendt,CYNDI 07/12/2014, 3:32 PM  Magda Kiel, [place] 2014-07-12

## 2014-07-08 NOTE — Clinical Social Work Note (Signed)
CSW Consult Acknowledged:   The unit CSW received a consult for medication needs. The unit CSW will inform the case manager regarding this consult. The unit CSW review chart the psychiatrist is recommending inpatient psych. The unit CSW will inform the psych CSW. The unit CSW will sign off.   [place], MSW, [place]
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
## 2014-07-08 NOTE — Progress Notes (Signed)
Medicare Important Message given? YES (If response is "NO", the following Medicare IM given date fields will be blank) Date Medicare IM given: 07/08/14  Medicare IM given by: Bobak Oguinn 

## 2014-07-09 LAB — BASIC METABOLIC PANEL
Anion gap: 9 (ref 5–15)
BUN: 10 mg/dL (ref 6–20)
CHLORIDE: 113 mmol/L — AB (ref 101–111)
CO2: 23 mmol/L (ref 22–32)
CREATININE: 0.85 mg/dL (ref 0.61–1.24)
Calcium: 8.5 mg/dL — ABNORMAL LOW (ref 8.9–10.3)
GFR calc Af Amer: >60 mL/min (ref >60)
GFR calc non Af Amer: >60 mL/min (ref >60)
Glucose, Bld: 82 mg/dL (ref 65–99)
Potassium: 3.5 mmol/L (ref 3.5–5.1)
Sodium: 145 mmol/L (ref 135–145)

## 2014-07-09 MED ORDER — DIVALPROEX SODIUM ER 500 MG PO TB24
500.0000 mg | ORAL_TABLET | Freq: Every day | ORAL | Status: DC
  Administered 2014-07-11 – 2014-07-16 (×3): 500 mg via ORAL
  Filled 2014-07-09 (×8): qty 1

## 2014-07-09 MED ORDER — THIAMINE HCL 100 MG/ML IJ SOLN
100.0000 mg | Freq: Every day | INTRAMUSCULAR | Status: DC
  Filled 2014-07-09 (×2): qty 1

## 2014-07-09 MED ORDER — FOLIC ACID 1 MG PO TABS
1.0000 mg | ORAL_TABLET | Freq: Every day | ORAL | Status: DC
  Administered 2014-07-09 – 2014-07-16 (×8): 1 mg via ORAL
  Filled 2014-07-09 (×9): qty 1

## 2014-07-09 MED ORDER — VITAMIN B-1 100 MG PO TABS
100.0000 mg | ORAL_TABLET | Freq: Every day | ORAL | Status: DC
  Administered 2014-07-09 – 2014-07-16 (×8): 100 mg via ORAL
  Filled 2014-07-09 (×9): qty 1

## 2014-07-09 MED ORDER — FOLIC ACID 5 MG/ML IJ SOLN
1.0000 mg | Freq: Every day | INTRAMUSCULAR | Status: DC
  Filled 2014-07-09 (×2): qty 0.2

## 2014-07-09 MED ORDER — ROPINIROLE HCL 1 MG PO TABS
2.0000 mg | ORAL_TABLET | Freq: Once | ORAL | Status: AC
  Administered 2014-07-09: 2 mg via ORAL
  Filled 2014-07-09: qty 2
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
## 2014-07-09 NOTE — Discharge Instructions (Signed)
You were admitted to the hospital with confusion. While here, we adjusted some of your medications. We also treated your arm infection with antibiotics. It is important that you follow up with your primary care doctor.   Cellulitis Cellulitis is an infection of the skin and the tissue beneath it. The infected area is usually red and tender. Cellulitis occurs most often in the arms and lower legs.  CAUSES  Cellulitis is caused by bacteria that enter the skin through cracks or cuts in the skin. The most common types of bacteria that cause cellulitis are staphylococci and streptococci. SIGNS AND SYMPTOMS   Redness and warmth.  Swelling.  Tenderness or pain.  Fever. DIAGNOSIS  Your health care provider can usually determine what is wrong based on a physical exam. Blood tests may also be done. TREATMENT  Treatment usually involves taking an antibiotic medicine. HOME CARE INSTRUCTIONS   Take your antibiotic medicine as directed by your health care provider. Finish the antibiotic even if you start to feel better.  Keep the infected arm or leg elevated to reduce swelling.  Apply a warm cloth to the affected area up to 4 times per day to relieve pain.  Take medicines only as directed by your health care provider.  Keep all follow-up visits as directed by your health care provider. SEEK MEDICAL CARE IF:   You notice red streaks coming from the infected area.  Your red area gets larger or turns dark in color.  Your bone or joint underneath the infected area becomes painful after the skin has healed.  Your infection returns in the same area or another area.  You notice a swollen bump in the infected area.  You develop new symptoms.  You have a fever. SEEK IMMEDIATE MEDICAL CARE IF:   You feel very sleepy.  You develop vomiting or diarrhea.  You have a general ill feeling (malaise) with muscle aches and pains. MAKE SURE YOU:   Understand these instructions.  Will watch your  condition.  Will get help right away if you are not doing well or get worse. Document Released: 11/11/2004 Document Revised: 06/18/2013 Document Reviewed: 04/19/2011 [facility] Patient Information 2015 [place], [place]. This information is not intended to replace advice given to you by your health care provider. Make sure you discuss any questions you have with your health care provider.
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
## 2014-07-09 NOTE — Plan of Care (Signed)
Problem: Phase I Progression Outcomes Goal: Initial discharge plan identified Outcome: Completed/Met Date Met:  07/09/14 Possibly inpatient psych

## 2014-07-10 DIAGNOSIS — H5709 Other anomalies of pupillary function: Secondary | ICD-10-CM | POA: Insufficient documentation

## 2014-07-10 DIAGNOSIS — Z789 Other specified health status: Secondary | ICD-10-CM

## 2014-07-10 MED ORDER — ROPINIROLE HCL 1 MG PO TABS
1.0000 mg | ORAL_TABLET | Freq: Once | ORAL | Status: AC
  Administered 2014-07-10: 1 mg via ORAL
  Filled 2014-07-10: qty 1

## 2014-07-10 MED ORDER — PANTOPRAZOLE SODIUM 40 MG PO TBEC
40.0000 mg | DELAYED_RELEASE_TABLET | Freq: Every day | ORAL | Status: DC
  Administered 2014-07-10 – 2014-07-16 (×7): 40 mg via ORAL
  Filled 2014-07-10 (×5): qty 1

## 2014-07-10 MED ORDER — NAPHAZOLINE HCL 0.1 % OP SOLN
1.0000 [drp] | Freq: Four times a day (QID) | OPHTHALMIC | Status: DC | PRN
Start: 2014-07-10 — End: 2014-07-16
  Filled 2014-07-10: qty 15

## 2014-07-10 NOTE — Clinical Social Work Psych Note (Addendum)
11:02am- Psych CSW attempted to reach [name] [phone] via phone to collect collateral information per psychiatrist recommendation.  Once dialed, the phone rang multiple times with no answer and no voice mail set up.  Psych CSW will continue attempts.  10:59am- Patient has been referred to the following facilities: [facility]- referral received  [facility]- at capacity- pending discharges (asked for [facility] to review) [facility]-referral sent [facility]-at capacity referral sent [facility]-referral sent (no beds today, at capacity with no expected DC) [facility]- at capacity referral sent [facility] admissions reports someone said he had been moved/dc LCSW clarified patient still needs inpatient.  Currently he is on the wait list. [facility]-referral sent (Denied due to dementia)  [facility]-referral sent Dora Sims- at [place] will continue to assist with placement.  Nonnie Done, LCSW [phone]  Psychiatric & Orthopedics (5N 1-8) Clinical Social Worker

## 2014-07-10 NOTE — Progress Notes (Signed)
Physical Therapy Treatment Patient Details Name: Patrick Holt MRN: [id number] DOB: 1950/05/06 Today's Date: 07/10/2014    History of Present Illness Patient is a 64 y/o male admitted with AMS and Cellulitis. PMH is significant for CAD s/p MI, AFib (no anticoagulation), seizures, HTN, active smoker, DM2 (diet controlled), COPD, dermatomyotisis, and h/o psychotic episodes     PT Comments    Moving more today, still very dependent on RW, and able to ambulate shorter in-room distances; Would the use of a wheelchair help with managing at a [facility]?  Follow Up Recommendations  Other (comment) (Behavioral Health vs SNF)     Equipment Recommendations  Rolling walker with 5" wheels (a wheelchair may make it easier to manage at [facility])    Recommendations for Other Services       Precautions / Restrictions Precautions Precautions: Fall Precaution Comments: condom cath, diarrhea/incontinence    Mobility  Bed Mobility Overal bed mobility: Needs Assistance Bed Mobility: Supine to Sit     Supine to sit: Min assist     General bed mobility comments: Handheld assist to pull to sit  Transfers Overall transfer level: Needs assistance Equipment used: Rolling walker (2 wheeled) Transfers: Sit to/from Stand Sit to Stand: Min assist         General transfer comment: assist for lift off from bed, pt leaning forward and needed cues to wait for environmental set up.  Ambulation/Gait Ambulation/Gait assistance: Min guard;Min assist Ambulation Distance (Feet): 15 Feet (5+10) Assistive device: Rolling walker (2 wheeled) Gait Pattern/deviations: Shuffle   Gait velocity interpretation: Below normal speed for age/gender General Gait Details: Continued flexed posture and shuffling steps; cues for posture and RW proximity; one loss of balance requiring min assist to steady; one seated rest break   Stairs            Wheelchair Mobility    Modified  Rankin (Stroke Patients Only)       Balance     Sitting balance-Leahy Scale: Fair       Standing balance-Leahy Scale: Poor                      Cognition Arousal/Alertness: Awake/alert Behavior During Therapy: Flat affect                   General Comments: Much more participative today than documented last session    Exercises      General Comments        Pertinent Vitals/Pain Pain Assessment: Faces Faces Pain Scale: Hurts a little bit Pain Location: Headache Pain Descriptors / Indicators: Headache Pain Intervention(s): Monitored during session    Home Living                      Prior Function            PT Goals (current goals can now be found in the care plan section) Acute Rehab PT Goals Patient Stated Goal: agreeable to walking PT Goal Formulation: With patient Time For Goal Achievement: 07/22/14 Potential to Achieve Goals: Fair Progress towards PT goals: Progressing toward goals    Frequency  Min 3X/week    PT Plan Discharge plan needs to be updated    Co-evaluation             End of Session Equipment Utilized During Treatment: Gait belt Activity Tolerance: Patient tolerated treatment well Patient left: in chair;with call bell/phone within reach;with chair alarm set  Time: 4847-2072 PT Time Calculation (min) (ACUTE ONLY): 17 min  Charges:  $Gait Training: 8-22 mins                    G Codes:      Patrick Holt 07/10/2014, 4:11 PM  Patrick Holt, [place] Pager [phone] Office [phone]
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
## 2014-07-10 NOTE — Progress Notes (Signed)
Redness(Cellulitis) area on patient left arm 3 cm x3 cm,no blister formation,no drainage noted. Cellulitis on right arm with a big ruptured blister 3.5 x 5 cm x ,slight clear drainage noted.Dressed as per order.

## 2014-07-11 NOTE — Treatment Plan (Signed)
Pt is most appropriate for a gero/med psych bed.  Referrals should made to [facility], [place], [facility], [place], [place], [place], [place], [place], [place], and [place].  Pt's lack of steady independent ambulation would make placement at [facility] a less desirable choice.

## 2014-07-11 NOTE — Progress Notes (Signed)
Pt alert and responsive, calm and cooperative.  Pleasant at this time.
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
## 2014-07-11 NOTE — Clinical Social Work Psych Note (Signed)
Psych CSW received notification that patient may better be suited for gero/med psych placement.  Psych CSW will make the following referrals: [facility]- referral sent [facility]- referral sent [facility]- at capacity [facility]-at capacity [facility]-referral sent 07/09/2014 [facility]-referral sent [facility]- referral sent 07/09/2014 [facility]-at capacity [facility]- at capacity referral sent 07/09/2014  Nonnie Done, LCSW [phone]  Psychiatric & Orthopedics (5N 1-8) Clinical Social Worker

## 2014-07-12 DIAGNOSIS — L03113 Cellulitis of right upper limb: Secondary | ICD-10-CM

## 2014-07-12 DIAGNOSIS — F259 Schizoaffective disorder, unspecified: Secondary | ICD-10-CM

## 2014-07-12 MED ORDER — IPRATROPIUM-ALBUTEROL 0.5-2.5 (3) MG/3ML IN SOLN: 3.0000 mL | Freq: Four times a day (QID) | RESPIRATORY_TRACT | Status: DC | PRN

## 2014-07-12 MED ORDER — IPRATROPIUM-ALBUTEROL 20-100 MCG/ACT IN AERS: 1.0000 | INHALATION_SPRAY | Freq: Four times a day (QID) | RESPIRATORY_TRACT | Status: DC | PRN

## 2014-07-12 NOTE — Progress Notes (Signed)
Physical Therapy Treatment Patient Details Name: BASHEER MOLCHAN MRN: [id number] DOB: 1950/02/22 Today's Date: 07/12/2014    History of Present Illness Patient is a 64 y/o male admitted with AMS and Cellulitis. PMH is significant for CAD s/p MI, AFib (no anticoagulation), seizures, HTN, active smoker, DM2 (diet controlled), COPD, dermatomyotisis, and h/o psychotic episodes     PT Comments    Pt progressing well with mobility, he walked 150' with RW and min/guard assist, no loss of balance. Pt had some difficulty following directions and with problem solving (he was struggling to cut his fish for lunch and required assist).   Follow Up Recommendations  Other (comment) (Behavioral Health vs SNF)     Equipment Recommendations  Rolling walker with 5" wheels (a wheelchair may make it easier to manage at [facility]    Recommendations for Other Services       Precautions / Restrictions Precautions Precautions: Fall Precaution Comments: condom cath, diarrhea/incontinence Restrictions Weight Bearing Restrictions: No    Mobility  Bed Mobility               General bed mobility comments: NT -up in recliner  Transfers Overall transfer level: Needs assistance Equipment used: Rolling walker (2 wheeled) Transfers: Sit to/from Stand Sit to Stand: Mod assist         General transfer comment: increased time and physical assist required (didn't respond to verbal/manual cues) for hand placement on armrests for sit to stand, and for hand placement on RW, MOd A to power up  Ambulation/Gait Ambulation/Gait assistance: Min guard Ambulation Distance (Feet): 150 Feet Assistive device: Rolling walker (2 wheeled) Gait Pattern/deviations: Step-through pattern;Decreased step length - right;Decreased step length - left;Trunk flexed   Gait velocity interpretation: Below normal speed for age/gender General Gait Details: flexed neck, pt did not correct this with verbal cues,     Stairs            Wheelchair Mobility    Modified Rankin (Stroke Patients Only)       Balance     Sitting balance-Leahy Scale: Fair     Standing balance support: Bilateral upper extremity supported Standing balance-Leahy Scale: Poor                      Cognition Arousal/Alertness: Awake/alert Behavior During Therapy: Flat affect Overall Cognitive Status: Impaired/Different from baseline Area of Impairment: Attention;Following commands;Problem solving Orientation Level:  (knew day and date due to referring to calendar in room) Current Attention Level: Focused Memory: Decreased short-term memory Following Commands: Follows one step commands inconsistently     Problem Solving: Slow processing;Decreased initiation;Requires tactile cues;Requires verbal cues General Comments: pt unable to follow commands to place hands on armrests of chair for sit to stand, also had difficulty placing hands on grips of RW    Exercises      General Comments        Pertinent Vitals/Pain Pain Assessment: Faces Pain Score: 4  Pain Location: face Pain Descriptors / Indicators: Sore Pain Intervention(s): Monitored during session    Home Living                      Prior Function            PT Goals (current goals can now be found in the care plan section) Acute Rehab PT Goals Patient Stated Goal: agreeable to walking PT Goal Formulation: With patient Time For Goal Achievement: 07/22/14 Potential to Achieve Goals: Fair Progress towards  PT goals: Progressing toward goals    Frequency  Min 3X/week    PT Plan Current plan remains appropriate    Co-evaluation             End of Session Equipment Utilized During Treatment: Gait belt Activity Tolerance: Patient tolerated treatment well Patient left: in chair;with call bell/phone within reach;with chair alarm set     Time: 6438-3779 PT Time Calculation (min) (ACUTE ONLY): 17 min  Charges:   $Gait Training: 8-22 mins                    G Codes:      Blondell Reveal Kistler 07/12/2014, 2:07 PM [phone]
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
## 2014-07-12 NOTE — Clinical Social Work Psych Note (Addendum)
1:18pm- Psych CSW reviewed patient's disposition needs with CSW Market researcher.  Medical Director aware and actively assisting psych CSW with disposition needs.    8:59am- Psych CSW made the following referrals: [facility]- recommended geropsych [place]-referral sent [facility]- referral sent [facility]- referral sent [facility]- referral sent [facility]- at capacity [place]-at capacity [facility]-referral sent 07/09/2014 [facility]-referral sent- at [facility]- referral sent 07/09/2014- denied [facility]-at capacity [place]- at capacity referral sent 07/09/2014- at capacity [facility]- referral sent will have discharges this evening-will review when bed available  Nonnie Done, LCSW [phone]  Psychiatric & Orthopedics (5N 1-8) Clinical Social Worker
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
## 2014-07-13 LAB — BASIC METABOLIC PANEL
ANION GAP: 9 (ref 5–15)
BUN: 18 mg/dL (ref 6–20)
CHLORIDE: 106 mmol/L (ref 101–111)
CO2: 26 mmol/L (ref 22–32)
Calcium: 9 mg/dL (ref 8.9–10.3)
Creatinine, Ser: 0.8 mg/dL (ref 0.61–1.24)
GFR calc Af Amer: >60 mL/min (ref >60)
GFR calc non Af Amer: >60 mL/min (ref >60)
Glucose, Bld: 100 mg/dL — ABNORMAL HIGH (ref 65–99)
Potassium: 3.5 mmol/L (ref 3.5–5.1)
Sodium: 141 mmol/L (ref 135–145)

## 2014-07-13 LAB — CULTURE, BLOOD (ROUTINE X 2)
CULTURE: NO GROWTH
Culture: NO GROWTH

## 2014-07-13 MED ORDER — ACETAMINOPHEN 325 MG PO TABS
650.0000 mg | ORAL_TABLET | Freq: Four times a day (QID) | ORAL | Status: DC | PRN
  Administered 2014-07-13 – 2014-07-15 (×3): 650 mg via ORAL
  Filled 2014-07-13 (×3): qty 2

## 2014-07-13 NOTE — Progress Notes (Signed)
Family Medicine Teaching Service Daily Progress Note Intern Pager: [phone]  Patient name: Patrick Holt Medical record number: [id number] Date of birth: 1950/12/05 Age: 64 y.o. Gender: male  Primary Care Provider: Howard Pouch, DO Consultants: psych Code Status: full  Pt Overview and Major Events to Date:  5/20: ED for Acute Psychosis, AKI 5/21: Admitted for Cellulitis and somnolence 5/22: Somnolence cleared. Consulting Psych for delusions 5/23: Medically stable for inpatient psych when bed available 5/26: Continues to be stable, searching for psych bed  Assessment and Plan: [name] is a 64 y.o. male presenting with AMS and Cellulitis. PMH is significant for CAD s/p MI, AFib (no anticoagulation), seizures, HTN, active smoker, DM2 (diet controlled), COPD, dermatomyotisis, and h/o psychotic episodes   # Psych:  Bipolar, mixed and Chronic Paranoid Schizophrenia. Multiple prior admissions for psychotic behaviors but has never ultimately been hospitalized at a psychiatric facility due to lack of placement and patient will regain capacity and requests discharge. He is unwilling to be followed by psychiatry and has been fairly well controlled on Depakote, Xanax and Keppra. Active hallucinations when presented to ED 5/20; called for admission due to decreased level of alertness 5/21. Prior admissions for psychotic behaviors and medication noncompliance. Also a component also likely due to polypharmacy and reinitiating multiple medications. Depakote level sub-therapeutic, likely due to noncompliance.     - Psych consulted, appreciate assistance - Risperdal 1mg  bid per psych - Depakote 500mg  daily - Keppra 500mg  bid - Requip: Tapered off, last dose on 5/25 - Holding home Xanax - Psych recommending inpatient psychiatry placement.   # Cellulitis: Right forearm cellulitis with bullae at site of IV infiltration. Improving.  - Doxy: (5/22>>); Vanc/CTX: (5/21>>5/21) - BCx: NGTD  -  Wound care consulted to help manage wound, appreciate assistance  # CV: (Hx of HTN, HLD, CAD, MI, A Fib on amiodarone. Followed by Dr. 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 Echo 03/2014 EF 50% w/ mild LVH - Continue home Amiodarone 200mg  qd, ASA 81mg ; Lipitor 40mg  qhs  # H/o parkinson's symptoms - Tapered off Requip - Consider changing antipsychotics if side effects become intolerable - PT eval - recommending SNF if not going to inpatient psych  # AKI (Resolved). Cr 1.75 (5/20, admit) Baseline Cr 1.1. CK also noted to be mildly elevated to 2900 (trended to 1573). Resolved with IVF. - recheck Bmet today  # Chronic Problems # GI (Multiple issues with prior polyps, diverticulosis with recurrent diverticulitis, esophageal dysmotility, strictures and reflux, IBS) - Continue Home PPI # COPD: Continue home Combivent # Hx of Dermatomyositis - Needs to re-establish with Rheumatology  # Urinary incontinence: Holding Oxybutynin: (Home dose 5mg  TID)   # Somnolence, resolved  FEN/GI: Heart healthy diet; SLIV Prophylaxis: Subq Heparin  Dispo: Medically clear for inpatient psych when bed available.   Subjective:  Still endorses feeling of "saltwater" in his eyes. He has had a minor frontal headache that is better with sleep. No radiation. He notices that the roof of the hospital (as seen from his window) appears as if it has ice. No other concerns overnight.   Objective: Temp:  [98.8 F (37.1 C)-99.8 F (37.7 C)] 99.7 F (37.6 C) (05/28 0505) Pulse Rate:  [82-92] 90 (05/28 0505) Resp:  [16] 16 (05/28 0505) BP: (124-156)/(66-72) 134/72 mmHg (05/28 0505) SpO2:  [90 %-94 %] 94 % (05/28 0505) Weight:  [222 lb 8 oz (100.925  kg)] 222 lb 8 oz (100.925 kg) (05/27 2105)  Physical Exam: General:NAD; conversational  Cardiovascular: RRR, II/VI early systolic murmur heard  best in RUSB, radial pulses 2+ bilaterally;  Respiratory: Clear to auscultation bilaterally GI: Soft, non-tender, non-distended Skin: Right forearm with multiple areas of denuded skin. No surrounding erythema. Neuro: A&Ox3;  Psych: Blunted affect, no apparent SI/HI  Laboratory/imaging:  No results found.  Mariel Aloe, MD 07/13/2014, 8:38 AM PGY-2, [place] Intern pager: [phone], text pages welcome

## 2014-07-14 MED ORDER — ACETAMINOPHEN 325 MG PO TABS
650.0000 mg | ORAL_TABLET | ORAL | Status: AC
  Administered 2014-07-14: 650 mg via ORAL
  Filled 2014-07-14: qty 2

## 2014-07-14 NOTE — Clinical Social Work Note (Signed)
Weekend CSW continuing to assist with Inpatient Psychiatric placement for patient. No beds available as of 07/14/2014. Psych CSW to resume care on 5/30 and will continue to follow and assist with Inpatient Psychiatric placement.  Lubertha Sayres, [place] Clinical Social Work Department Orthopedics [phone]) and Surgical [phone])
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
## 2014-07-15 NOTE — Progress Notes (Signed)
Family Medicine Teaching Service Daily Progress Note Intern Pager: [phone]  Patient name: Patrick Holt Medical record number: [id number] Date of birth: 01-10-1951 Age: 64 y.o. Gender: male  Primary Care Provider: Howard Pouch, DO Consultants: psych Code Status: full  Pt Overview and Major Events to Date:  5/20: ED for Acute Psychosis, AKI 5/21: Admitted for Cellulitis and somnolence 5/22: Somnolence cleared. Consulting Psych for delusions 5/23: Medically stable for inpatient psych when bed available 5/26: Continues to be stable, searching for psych bed  Assessment and Plan: Patrick Holt is a 64 y.o. male presenting with AMS and Cellulitis. PMH is significant for CAD s/p MI, AFib (no anticoagulation), seizures, HTN, active smoker, DM2 (diet controlled), COPD, dermatomyotisis, and h/o psychotic episodes   # Psych:  Bipolar, mixed and Chronic Paranoid Schizophrenia. Multiple prior admissions for psychotic behaviors but has never ultimately been hospitalized at a psychiatric facility due to lack of placement and patient will regain capacity and requests discharge. He is unwilling to be followed by psychiatry and has been fairly well controlled on Depakote, Xanax and Keppra. Active hallucinations when presented to ED 5/20; called for admission due to decreased level of alertness 5/21.     - Psych consulted, appreciate assistance - Risperdal 1mg  bid per psych - Depakote 500mg  daily - Keppra 500mg  bid - Requip: Tapered off, last dose on 5/25 - Holding home Xanax - Psych recommending inpatient psychiatry placement.   # Cellulitis: Right forearm cellulitis with bullae at site of IV infiltration. Improving.  - Doxy: (5/22>>); Vanc/CTX: (5/21>>5/21) - BCx: NGTD  - Wound care consulted to help manage wound, appreciate assistance  # CV: (Hx of HTN, HLD, CAD, MI, A Fib on amiodarone. Followed by Dr. Harrington Challenger) - Not a candidate for anticoagulation due to "tiny bilateral frontal  subarachnoid or subdural hemorrhages without significant trauma" seen on MRI 02/04/2012. Echo 03/2014 EF 50% w/ mild LVH - Continue home Amiodarone 200mg  qd, ASA 81mg ; Lipitor 40mg  qhs  # H/o parkinson's symptoms - Tapered off Requip - Consider changing antipsychotics if side effects become intolerable - PT eval - recommending SNF if not going to inpatient psych  # AKI (Resolved). Cr 1.75 (5/20, admit) Baseline Cr 1.1. CK also noted to be mildly elevated to 2900 (trended to 1573). Resolved with IVF.  # Somnolence, resolved  # Chronic Problems # GI (Multiple issues with prior polyps, diverticulosis with recurrent diverticulitis, esophageal dysmotility, strictures and reflux, IBS) - Continue Home PPI # COPD: Continue home Combivent # Hx of Dermatomyositis - Needs to re-establish with Rheumatology  # Urinary incontinence: Holding Oxybutynin: (Home dose 5mg  TID)   FEN/GI: Heart healthy diet; SLIV Prophylaxis: Subq Heparin  Dispo: Medically clear for inpatient psych when bed available.   Subjective:  Doing well this morning. Feels like he is "doing about the same." Worried about the copperheads in his house.   Objective: Temp:  [98.1 F (36.7 C)-98.9 F (37.2 C)] 98.1 F (36.7 C) (05/30 0454) Pulse Rate:  [69-79] 79 (05/30 0454) Resp:  [18-20] 20 (05/30 0454) BP: (139-166)/(76-88) 166/88 mmHg (05/30 0454) SpO2:  [91 %-93 %] 93 % (05/30 0454) Weight:  [225 lb 9.6 oz (102.331 kg)] 225 lb 9.6 oz (102.331 kg) (05/29 2100)  Physical Exam: General:NAD; conversational  Cardiovascular: RRR, no murmurs appreciated, radial pulses 2+ bilaterally;  Respiratory: NWOB, CTAB GI: Soft, non-tender, non-distended Skin: Right forearm with multiple areas of denuded skin. No surrounding erythema. Neuro: A&Ox3;  Psych: Blunted affect, no apparent SI/HI  Laboratory/imaging:  None  new.   Vivi Barrack, MD 07/15/2014, 7:55 AM PGY-1, [place] Intern pager: [phone],  text pages welcome

## 2014-07-15 NOTE — Progress Notes (Signed)
Physical Therapy Treatment Patient Details Name: Patrick Holt MRN: [id number] DOB: 06-29-1950 Today's Date: 07/15/2014    History of Present Illness Patient is a 64 y/o male admitted with AMS and Cellulitis. PMH is significant for CAD s/p MI, AFib (no anticoagulation), seizures, HTN, active smoker, DM2 (diet controlled), COPD, dermatomyotisis, and h/o psychotic episodes     PT Comments    Making gains with mobility, balance, and activity tolerance; Able to walk 150 ft with RW without loss of balance; It is worth considering: if [name]'s mental health needs outweigh his physical functioning needs, can he go to [facility] with PT follow up there? Otherwise, he can still dc to SNF for consistent PT, then go to [facility]   Follow Up Recommendations  Other (comment) (Behavioral Health vs SNF)     Equipment Recommendations  Rolling walker with 5" wheels;3in1 (PT)    Recommendations for Other Services       Precautions / Restrictions Precautions Precautions: Fall Precaution Comments: condom cath, diarrhea/incontinence    Mobility  Bed Mobility Overal bed mobility: Needs Assistance Bed Mobility: Supine to Sit     Supine to sit: Min assist     General bed mobility comments: min handheld assist to pull to sit; increased time to reciprocally scoot to EOB  Transfers Overall transfer level: Needs assistance Equipment used: Rolling walker (2 wheeled) Transfers: Sit to/from Stand Sit to Stand: Mod assist         General transfer comment: Cues for hand placement and safety; good use of grab bar to stand from low commode; light mod assist to power up  Ambulation/Gait Ambulation/Gait assistance: Min guard Ambulation Distance (Feet): 150 Feet Assistive device: Rolling walker (2 wheeled) Gait Pattern/deviations: Step-through pattern;Trunk flexed     General Gait Details: continued cues for posture and RW porximity   Stairs            Wheelchair Mobility    Modified  [place] (Stroke Patients Only)       Balance                                    Cognition Arousal/Alertness: Awake/alert Behavior During Therapy: Flat affect                   General Comments: very concerned, almost perseverative re: his condom cath    Exercises      General Comments        Pertinent Vitals/Pain Pain Assessment: No/denies pain    Home Living                      Prior Function            PT Goals (current goals can now be found in the care plan section) Acute Rehab PT Goals Patient Stated Goal: agreeable to walking PT Goal Formulation: With patient Time For Goal Achievement: 07/22/14 Potential to Achieve Goals: Fair Progress towards PT goals: Progressing toward goals    Frequency  Min 3X/week    PT Plan Current plan remains appropriate (See comments)    Co-evaluation             End of Session Equipment Utilized During Treatment: Gait belt Activity Tolerance: Patient tolerated treatment well Patient left: in chair;with call bell/phone within reach;with chair alarm set     Time: 3016-0109 PT Time Calculation (min) (ACUTE ONLY): 22 min  Charges:  $  Gait Training: 8-22 mins                    G Codes:      Roney Marion [facility] 07/15/2014, 9:21 AM  Roney Marion, [place] Pager [phone] Office [phone]

## 2014-07-16 ENCOUNTER — Inpatient Hospital Stay: Payer: Commercial Managed Care - HMO

## 2014-07-16 ENCOUNTER — Emergency Department
Admission: EM | Admit: 2014-07-16 | Discharge: 2014-07-16 | Disposition: A | Discharge status: Home / Self Care | Payer: Commercial Managed Care - HMO

## 2014-07-16 ENCOUNTER — Other Ambulatory Visit: Payer: Self-pay | Admitting: Family Medicine

## 2014-07-16 ENCOUNTER — Inpatient Hospital Stay
Admission: EM | Admit: 2014-07-16 | Discharge: 2014-07-23 | DRG: 885 | Disposition: A | Discharge status: Home / Self Care | Payer: Commercial Managed Care - HMO | Source: Intra-hospital | Attending: Psychiatry | Admitting: Psychiatry

## 2014-07-16 ENCOUNTER — Encounter: Payer: Self-pay | Admitting: Psychiatry

## 2014-07-16 DIAGNOSIS — J449 Chronic obstructive pulmonary disease, unspecified: Secondary | ICD-10-CM | POA: Diagnosis present

## 2014-07-16 DIAGNOSIS — Z886 Allergy status to analgesic agent status: Secondary | ICD-10-CM | POA: Diagnosis not present

## 2014-07-16 DIAGNOSIS — Z818 Family history of other mental and behavioral disorders: Secondary | ICD-10-CM

## 2014-07-16 DIAGNOSIS — Z87442 Personal history of urinary calculi: Secondary | ICD-10-CM

## 2014-07-16 DIAGNOSIS — Z811 Family history of alcohol abuse and dependence: Secondary | ICD-10-CM

## 2014-07-16 DIAGNOSIS — Z823 Family history of stroke: Secondary | ICD-10-CM | POA: Diagnosis not present

## 2014-07-16 DIAGNOSIS — Z638 Other specified problems related to primary support group: Secondary | ICD-10-CM

## 2014-07-16 DIAGNOSIS — Z9842 Cataract extraction status, left eye: Secondary | ICD-10-CM

## 2014-07-16 DIAGNOSIS — M3390 Dermatopolymyositis, unspecified, organ involvement unspecified: Secondary | ICD-10-CM | POA: Diagnosis present

## 2014-07-16 DIAGNOSIS — I10 Essential (primary) hypertension: Secondary | ICD-10-CM | POA: Diagnosis present

## 2014-07-16 DIAGNOSIS — Z961 Presence of intraocular lens: Secondary | ICD-10-CM | POA: Diagnosis present

## 2014-07-16 DIAGNOSIS — F22 Delusional disorders: Secondary | ICD-10-CM | POA: Diagnosis present

## 2014-07-16 DIAGNOSIS — Z887 Allergy status to serum and vaccine status: Secondary | ICD-10-CM

## 2014-07-16 DIAGNOSIS — E119 Type 2 diabetes mellitus without complications: Secondary | ICD-10-CM | POA: Diagnosis present

## 2014-07-16 DIAGNOSIS — R05 Cough: Secondary | ICD-10-CM

## 2014-07-16 DIAGNOSIS — Z86010 Personal history of colonic polyps: Secondary | ICD-10-CM | POA: Diagnosis not present

## 2014-07-16 DIAGNOSIS — E785 Hyperlipidemia, unspecified: Secondary | ICD-10-CM | POA: Diagnosis present

## 2014-07-16 DIAGNOSIS — G40909 Epilepsy, unspecified, not intractable, without status epilepticus: Secondary | ICD-10-CM | POA: Diagnosis present

## 2014-07-16 DIAGNOSIS — J189 Pneumonia, unspecified organism: Secondary | ICD-10-CM | POA: Diagnosis present

## 2014-07-16 DIAGNOSIS — Z888 Allergy status to other drugs, medicaments and biological substances status: Secondary | ICD-10-CM | POA: Diagnosis not present

## 2014-07-16 DIAGNOSIS — Z8701 Personal history of pneumonia (recurrent): Secondary | ICD-10-CM

## 2014-07-16 DIAGNOSIS — F29 Unspecified psychosis not due to a substance or known physiological condition: Secondary | ICD-10-CM | POA: Diagnosis present

## 2014-07-16 DIAGNOSIS — M797 Fibromyalgia: Secondary | ICD-10-CM | POA: Diagnosis present

## 2014-07-16 DIAGNOSIS — Z8249 Family history of ischemic heart disease and other diseases of the circulatory system: Secondary | ICD-10-CM

## 2014-07-16 DIAGNOSIS — R32 Unspecified urinary incontinence: Secondary | ICD-10-CM | POA: Diagnosis present

## 2014-07-16 DIAGNOSIS — F259 Schizoaffective disorder, unspecified: Principal | ICD-10-CM | POA: Diagnosis present

## 2014-07-16 DIAGNOSIS — R059 Cough, unspecified: Secondary | ICD-10-CM

## 2014-07-16 DIAGNOSIS — Z833 Family history of diabetes mellitus: Secondary | ICD-10-CM | POA: Diagnosis not present

## 2014-07-16 DIAGNOSIS — K219 Gastro-esophageal reflux disease without esophagitis: Secondary | ICD-10-CM | POA: Diagnosis present

## 2014-07-16 DIAGNOSIS — Z9889 Other specified postprocedural states: Secondary | ICD-10-CM

## 2014-07-16 DIAGNOSIS — R159 Full incontinence of feces: Secondary | ICD-10-CM | POA: Diagnosis present

## 2014-07-16 DIAGNOSIS — F17210 Nicotine dependence, cigarettes, uncomplicated: Secondary | ICD-10-CM | POA: Diagnosis present

## 2014-07-16 DIAGNOSIS — Z882 Allergy status to sulfonamides status: Secondary | ICD-10-CM

## 2014-07-16 MED ORDER — IPRATROPIUM-ALBUTEROL 0.5-2.5 (3) MG/3ML IN SOLN
3.0000 mL | Freq: Every day | RESPIRATORY_TRACT | Status: DC
  Administered 2014-07-17 – 2014-07-21 (×5): 3 mL via RESPIRATORY_TRACT
  Filled 2014-07-16 (×7): qty 3

## 2014-07-16 MED ORDER — LEVETIRACETAM 500 MG PO TABS
500.0000 mg | ORAL_TABLET | Freq: Two times a day (BID) | ORAL | Status: DC
  Administered 2014-07-16 – 2014-07-23 (×14): 500 mg via ORAL
  Filled 2014-07-16 (×14): qty 1

## 2014-07-16 MED ORDER — RISPERIDONE 1 MG PO TABS: 1.0000 mg | ORAL_TABLET | Freq: Two times a day (BID) | ORAL | Status: DC

## 2014-07-16 MED ORDER — POLYETHYLENE GLYCOL 3350 17 GM/SCOOP PO POWD
17.0000 g | Freq: Every day | ORAL | Status: DC
  Filled 2014-07-16: qty 255

## 2014-07-16 MED ORDER — ACETAMINOPHEN 325 MG PO TABS: 650.0000 mg | ORAL_TABLET | Freq: Four times a day (QID) | ORAL | Status: DC | PRN

## 2014-07-16 MED ORDER — ATORVASTATIN CALCIUM 20 MG PO TABS: 40.0000 mg | ORAL_TABLET | Freq: Every day | ORAL | Status: DC

## 2014-07-16 MED ORDER — DOXYCYCLINE HYCLATE 100 MG PO TABS
100.0000 mg | ORAL_TABLET | Freq: Two times a day (BID) | ORAL | Status: DC
  Administered 2014-07-16 – 2014-07-23 (×14): 100 mg via ORAL
  Filled 2014-07-16 (×15): qty 1

## 2014-07-16 MED ORDER — RISPERIDONE 1 MG PO TABS
1.0000 mg | ORAL_TABLET | Freq: Two times a day (BID) | ORAL | Status: DC
  Administered 2014-07-16 – 2014-07-23 (×14): 1 mg via ORAL
  Filled 2014-07-16 (×14): qty 1

## 2014-07-16 MED ORDER — PANTOPRAZOLE SODIUM 40 MG PO TBEC
40.0000 mg | DELAYED_RELEASE_TABLET | Freq: Every day | ORAL | Status: DC
  Administered 2014-07-16 – 2014-07-23 (×8): 40 mg via ORAL
  Filled 2014-07-16 (×8): qty 1

## 2014-07-16 MED ORDER — DIVALPROEX SODIUM ER 500 MG PO TB24
500.0000 mg | ORAL_TABLET | Freq: Every day | ORAL | Status: DC
  Administered 2014-07-16 – 2014-07-23 (×8): 500 mg via ORAL
  Filled 2014-07-16 (×8): qty 1

## 2014-07-16 MED ORDER — MAGNESIUM HYDROXIDE 400 MG/5ML PO SUSP: 30.0000 mL | Freq: Every day | ORAL | Status: DC | PRN

## 2014-07-16 MED ORDER — ASPIRIN EC 81 MG PO TBEC
81.0000 mg | DELAYED_RELEASE_TABLET | Freq: Every day | ORAL | Status: DC
  Administered 2014-07-16 – 2014-07-23 (×8): 81 mg via ORAL
  Filled 2014-07-16 (×9): qty 1

## 2014-07-16 MED ORDER — AMIODARONE HCL 200 MG PO TABS
200.0000 mg | ORAL_TABLET | Freq: Every day | ORAL | Status: DC
  Administered 2014-07-16 – 2014-07-23 (×8): 200 mg via ORAL
  Filled 2014-07-16 (×9): qty 1

## 2014-07-16 MED ORDER — ALUM & MAG HYDROXIDE-SIMETH 200-200-20 MG/5ML PO SUSP: 30.0000 mL | ORAL | Status: DC | PRN

## 2014-07-16 MED ORDER — ADULT MULTIVITAMIN W/MINERALS CH
1.0000 | ORAL_TABLET | Freq: Every day | ORAL | Status: DC
  Administered 2014-07-16 – 2014-07-23 (×8): 1 via ORAL
  Filled 2014-07-16 (×8): qty 1

## 2014-07-16 MED ORDER — DOXYCYCLINE HYCLATE 100 MG PO TABS: 100.0000 mg | ORAL_TABLET | Freq: Two times a day (BID) | ORAL | Status: AC

## 2014-07-16 NOTE — Progress Notes (Signed)
Report given to nurse Meredith Mody at [facility].  [name]

## 2014-07-16 NOTE — Telephone Encounter (Signed)
Refill request from pharmacy. Will forward to PCP for review. Imri Lor, CMA. 
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
## 2014-07-16 NOTE — BH Assessment (Signed)
[facility] Assessment Progress Note  At 13:52 I received a call from [facility] at [facility].  Pt has been accepted to their facility by Dr. Dwyane Dee.  Please call report to [phone].  For further details contact pt's Education officer, museum.  Jalene Mullet, MA Triage Specialist [phone]
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
## 2014-07-16 NOTE — Progress Notes (Signed)
At 13:52 I received a call from [facility] at [facility]. Pt has been accepted to their facility by Dr. Dwyane Dee. Please call report to [phone]. For further details contact pt's Education officer, museum.  LCSW to complete voluntary paperwork and consents.  Faxed consents. Patient will transport by [facility]. RN on [facility] made aware of plan.  Awaiting RN to call back and let LCSW know about time to call for transport.  Lane Hacker, MSW Clinical Social Work: Emergency Room [phone]

## 2014-07-17 DIAGNOSIS — F22 Delusional disorders: Secondary | ICD-10-CM

## 2014-07-17 LAB — LIPID PANEL
CHOL/HDL RATIO: 3.1 ratio
Cholesterol: 104 mg/dL (ref 0–200)
HDL: 34 mg/dL — AB (ref >40)
LDL Cholesterol: 51 mg/dL (ref 0–99)
Triglycerides: 96 mg/dL (ref <150)
VLDL: 19 mg/dL (ref 0–40)

## 2014-07-17 LAB — URINALYSIS COMPLETE WITH MICROSCOPIC (ARMC ONLY)
Bacteria, UA: NONE SEEN
Bilirubin Urine: NEGATIVE
GLUCOSE, UA: NEGATIVE mg/dL
Leukocytes, UA: NEGATIVE
Nitrite: NEGATIVE
Protein, ur: NEGATIVE mg/dL
Specific Gravity, Urine: 1.018 (ref 1.005–1.030)
pH: 7 (ref 5.0–8.0)

## 2014-07-17 LAB — FOLATE: Folate: 31 ng/mL (ref >5.9)

## 2014-07-17 LAB — CK: CK TOTAL: 271 U/L (ref 49–397)

## 2014-07-17 LAB — AMMONIA: Ammonia: 30 umol/L (ref 9–35)

## 2014-07-17 LAB — VALPROIC ACID LEVEL: Valproic Acid Lvl: 38 ug/mL — ABNORMAL LOW (ref 50.0–100.0)

## 2014-07-17 LAB — VITAMIN B12: VITAMIN B 12: 249 pg/mL (ref 180–914)

## 2014-07-17 MED ORDER — POLYETHYLENE GLYCOL 3350 17 G PO PACK
17.0000 g | PACK | Freq: Every day | ORAL | Status: DC
Start: 2014-07-17 — End: 2014-07-23
  Administered 2014-07-17 – 2014-07-20 (×2): 17 g via ORAL
  Filled 2014-07-17 (×5): qty 1

## 2014-07-17 NOTE — Evaluation (Signed)
Physical Therapy Evaluation Patient Details Name: Patrick Holt MRN: [id number] DOB: Sep 15, 1950 Today's Date: 07/17/2014   History of Present Illness  admitted to inpatient psychiatric from [facility] for management of psychological issues.  Initially admitted to [facility] seconadry to AMS/psychosis and cellulitis; found on floor, talking irrationally.  Clinical Impression  Upon evaluation, patient alert and oriented to self; generally confused regarding location and situation.  Follows all commands; pleasant and cooperative.  Slightly labile at times, but easily redirectable.  Demonstrates strength and ROM grossly WFL for basic transfers and mobility; noted balance deficits in standing. Able to complete bed mobility indep; sit/stand with RW, sup; gait (150') with RW, cga/min assist.  Higher-level balance deficits evident; unsafe to attempt gait without RW and +1 cga/close sup at this time. Would benefit from skilled PT to address above deficits and promote optimal return to PLOF; recommend transition to STR upon discharge from acute hospitalization (unsafe to return home alone at this time).     Follow Up Recommendations SNF    Equipment Recommendations  Rolling walker with 5" wheels    Recommendations for Other Services       Precautions / Restrictions Precautions Precautions: Fall Restrictions Weight Bearing Restrictions: No      Mobility  Bed Mobility Overal bed mobility: Independent Bed Mobility: Supine to Sit;Sit to Supine     Supine to sit: Modified independent (Device/Increase time) Sit to supine: Modified independent (Device/Increase time)      Transfers Overall transfer level: Needs assistance Equipment used: Rolling walker (2 wheeled) Transfers: Sit to/from Stand Sit to Stand: Supervision         General transfer comment: cuing for hand placement to prevent pulling on RW  Ambulation/Gait Ambulation/Gait assistance: Min assist;Min guard Ambulation  Distance (Feet): 150 Feet Assistive device: Rolling walker (2 wheeled)       General Gait Details: broad, slightly staggering BOS; forward flexed posture with heavy WBing on RW.  Decreased reaction time and ability to respond to external perturbation; broad turning radius  Financial trader Rankin (Stroke Patients Only)       Balance Overall balance assessment: Needs assistance Sitting-balance support: No upper extremity supported;Feet supported Sitting balance-Leahy Scale: Good     Standing balance support: No upper extremity supported Standing balance-Leahy Scale: Poor Standing balance comment: standing functional reach only 1-2" outside immediate BOS; high risk for falls outside BOS                             Pertinent Vitals/Pain Pain Assessment: No/denies pain    Home Living   Living Arrangements: Alone   Type of Home:  (Main level of a two-story duplex) Home Access: Ramped entrance     Home Layout: One level Home Equipment: Cane - single point;Walker - 2 wheels      Prior Function Level of Independence: Independent with assistive device(s)         Comments: Indep with household mobility/activities with SPC; does report fall history, but unable to quantify     Hand Dominance   Dominant Hand: Right    Extremity/Trunk Assessment   Upper Extremity Assessment: Generalized weakness (shoulder elevation limited to shoulder height only)           Lower Extremity Assessment: Generalized weakness (strength and ROM grossly WFL, at least 4+/5)  Communication   Communication: No difficulties  Cognition Arousal/Alertness: Awake/alert Behavior During Therapy: WFL for tasks assessed/performed (pleasant and cooperative; labile at times) Overall Cognitive Status: Impaired/Different from baseline Area of Impairment: Orientation;Memory;Safety/judgement;Awareness;Problem solving                     General Comments General comments (skin integrity, edema, etc.): reddened, peeling areas to ventral side of R forearm    Exercises        Assessment/Plan    PT Assessment Patient needs continued PT services  PT Diagnosis Difficulty walking;Generalized weakness   PT Problem List Decreased strength;Decreased mobility;Decreased activity tolerance;Decreased balance;Decreased knowledge of use of DME;Decreased cognition;Decreased safety awareness;Decreased coordination;Decreased range of motion  PT Treatment Interventions DME instruction;Therapeutic exercise;Gait training;Balance training;Functional mobility training;Therapeutic activities;Patient/family education   PT Goals (Current goals can be found in the Care Plan section) Acute Rehab PT Goals Patient Stated Goal: "to go to the bathroom" PT Goal Formulation: With patient Time For Goal Achievement: 07/22/14 Potential to Achieve Goals: Fair    Frequency Min 2X/week   Barriers to discharge Decreased caregiver support      Co-evaluation               End of Session Equipment Utilized During Treatment: Gait belt Activity Tolerance: Patient tolerated treatment well Patient left: in bed (sitter present at bedside) Nurse Communication: Mobility status         Time: 3888-7579 PT Time Calculation (min) (ACUTE ONLY): 21 min   Charges:   PT Evaluation $Initial PT Evaluation Tier I: 1 Procedure     PT G Codes:       Hiram Mciver H. Owens Shark, PT, DPT 07/17/2014, 4:31 PM [phone]
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
## 2014-07-17 NOTE — Progress Notes (Signed)
PT Cancellation Note  Patient Details Name: Patrick Holt MRN: [id number] DOB: 1950/10/29   Cancelled Treatment:    Reason Eval/Treat Not Completed: Other (comment) (Patient currently at group meeting; will re-attempt at later time/date  as available and medically appropriate.)   [name] H. Owens Shark, PT, DPT 07/17/2014, 1:53 PM [phone]

## 2014-07-17 NOTE — Progress Notes (Signed)
Recreation Therapy Notes  Date: 06.01.16 Time: 3:00 pm Location: Craft Room  Group Topic: Self-esteem  Goal Area(s) Addresses:  Patient will write at least one positive trait about self. Patient will list one benefit of having a good self-esteem.  Behavioral Response: Did not attend  Intervention: I Am  Activity: Patients were given a worksheet with the letter I on it and instructed to list as many positive trait about themselves inside the letter.  Education: LRT educated patient on ways they can increase their self-esteem.  Education Outcome: Patient did not attend group.  Clinical Observations/Feedback: Patient did not attend group.  Leonette Monarch, LRT/CTRS 07/17/2014 4:05 PM
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
## 2014-07-17 NOTE — Tx Team (Signed)
Initial Interdisciplinary Treatment Plan   PATIENT STRESSORS: Health problems Legal issue   PATIENT STRENGTHS: General fund of knowledge Supportive family/friends   PROBLEM LIST: Problem List/Patient Goals Date to be addressed Date deferred Reason deferred Estimated date of resolution  Psychosis 07/16/14     Anxiety 07/16/14                                                DISCHARGE CRITERIA:  Improved stabilization in mood, thinking, and/or behavior  PRELIMINARY DISCHARGE PLAN: Outpatient therapy  PATIENT/FAMIILY INVOLVEMENT: This treatment plan has been presented to and reviewed with the patient, MARCIA LEPERA, and/or family member.  The patient and family have been given the opportunity to ask questions and make suggestions.  Nash Mantis [facility] 07/17/2014, 6:21 AM

## 2014-07-17 NOTE — Plan of Care (Signed)
Problem: Alteration in mood; excessive anxiety as evidenced by: Goal: LTG-Patient's behavior demonstrates decreased anxiety (Patient's behavior demonstrates anxiety and he/she is utilizing learned coping skills to deal with anxiety-producing situations)  Outcome: Progressing Noted calm able to follow direction
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
## 2014-07-17 NOTE — Telephone Encounter (Signed)
Received request for risperdal refill. Pt is admitted to an inpatient psych facility (to my knowledge). Do not think this needs refilled right now, and medication may need changed if/when discharged from [place]. Please advise.

## 2014-07-18 LAB — HEMOGLOBIN A1C: HEMOGLOBIN A1C: 5.4 % (ref 4.0–6.0)

## 2014-07-18 LAB — HIV ANTIBODY (ROUTINE TESTING W REFLEX): HIV Screen 4th Generation wRfx: NONREACTIVE

## 2014-07-18 LAB — RPR: RPR Ser Ql: NONREACTIVE

## 2014-07-18 MED ORDER — FOLIC ACID 1 MG PO TABS
1.0000 mg | ORAL_TABLET | Freq: Every day | ORAL | Status: DC
  Administered 2014-07-18 – 2014-07-23 (×6): 1 mg via ORAL
  Filled 2014-07-18 (×6): qty 1

## 2014-07-18 MED ORDER — CYANOCOBALAMIN 1000 MCG/ML IJ SOLN
1000.0000 ug | Freq: Every day | INTRAMUSCULAR | Status: AC
  Administered 2014-07-18 – 2014-07-20 (×3): 1000 ug via INTRAMUSCULAR
  Filled 2014-07-18 (×3): qty 1

## 2014-07-18 NOTE — Progress Notes (Signed)
Physical Therapy Treatment Patient Details Name: Patrick Holt MRN: [id number] DOB: 07-16-1950 Today's Date: 07/18/2014    History of Present Illness admitted to inpatient psychiatric from [facility] for management of psychological issues.  Initially admitted to [facility] seconadry to AMS/psychosis and cellulitis; found on floor, talking irrationally.    PT Comments    Provided handout with written/pictorial descriptions of therex; encouraged performance outside of therapy.  Patient voiced understanding. Improved activity tolerance and functional performance compared to previous session.   Follow Up Recommendations  SNF     Equipment Recommendations  Rolling walker with 5" wheels    Recommendations for Other Services       Precautions / Restrictions Precautions Precautions: Fall Restrictions Weight Bearing Restrictions: No    Mobility  Bed Mobility Overal bed mobility: Needs Assistance Bed Mobility: Supine to Sit     Supine to sit: Modified independent (Device/Increase time)     General bed mobility comments: heavy use of bedrails to assist with truncal elevation this date  Transfers Overall transfer level: Needs assistance Equipment used: Rolling walker (2 wheeled) Transfers: Sit to/from Stand Sit to Stand: Min guard         General transfer comment: cuing for hand placement to prevent pulling on RW  Ambulation/Gait Ambulation/Gait assistance: Min guard Ambulation Distance (Feet): 150 Feet Assistive device: Rolling walker (2 wheeled)       General Gait Details: broad, slightly staggering BOS; forward flexed posture with heavy WBing on RW.  Decreased reaction time and ability to respond to external perturbation; broad turning radius   Financial trader Rankin (Stroke Patients Only)       Balance                                    Cognition                             Exercises Other Exercises Other Exercises: Standing LE therex with RW, 1x15, AROM for muscular strength/endurance: heel raises, mini squats, marching, hip flex/ext/abduct/adduct.  All require bilat UE support for optimal safety.  Intermittent seated rest periods throughout due to LE fatigue.  Provided handout with written/pictorial descriptions of therex; encouraged performance outside of therapy.  Patient voiced understanding.    General Comments        Pertinent Vitals/Pain Pain Assessment: No/denies pain    Home Living                      Prior Function            PT Goals (current goals can now be found in the care plan section) Acute Rehab PT Goals Patient Stated Goal: "to go to the bathroom" PT Goal Formulation: With patient Time For Goal Achievement: 07/22/14 Potential to Achieve Goals: Fair Progress towards PT goals: Progressing toward goals    Frequency  Min 2X/week    PT Plan Current plan remains appropriate    Co-evaluation             End of Session Equipment Utilized During Treatment: Gait belt Activity Tolerance: Patient tolerated treatment well Patient left:  (in dining room with sitter)     Time: 3500-9381 PT Time Calculation (min) (ACUTE ONLY): 16 min  Charges:  $Therapeutic Exercise:  8-22 mins                    G Codes:      Anaelle Dunton H. Owens Shark, PT, DPT 07/18/2014, 1:40 PM [phone]

## 2014-07-18 NOTE — Tx Team (Signed)
Interdisciplinary Treatment Plan Update (Adult)  Date:  07/18/2014 Time Reviewed:  3:38 PM  Progress in Treatment: Attending groups: Yes. Participating in groups:  Yes. Taking medication as prescribed:  Yes. Tolerating medication:  Yes. Family/Significant othe contact made:  No, will contact:  mother-in-law Patient understands diagnosis:  No. and As evidenced by:  patients report of delusions Discussing patient identified problems/goals with staff:  Yes. Medical problems stabilized or resolved:  Yes. Denies suicidal/homicidal ideation: Yes. Issues/concerns per patient self-inventory:  No. Other:  New problem(s) identified: No, Describe:  will refer to Financial Resources for Medicaid application and ALF placement  Discharge Plan or Barriers: Patient may need ALF placement or SNF and followup care.   Reason for Continuation of Hospitalization: Delusions   Comments:  Estimated length of stay: up to 4 days  New goal(s):  Review of initial/current patient goals per problem list:   See Care Plan  Attendees: Patient:   6/2/20163:38 PM  Family:   6/2/20163:38 PM  Physician:  Orson Slick, MD 6/2/20163:38 PM  Nursing:    6/2/20163:38 PM  Case Manager:   6/2/20163:38 PM  Counselor:   6/2/20163:38 PM  Other:   Carmell Austria, [place] 6/2/20163:38 PM  Other:  Dossie Arbour, LCSW 6/2/20163:38 PM  Other:  Toma Copier, LCSW 6/2/20163:38 PM  Other: Garald Braver, PsychD 6/2/20163:38 PM  Other: Everitt Amber, [place] 6/2/20163:38 PM  Other:  6/2/20163:38 PM  Other:  6/2/20163:38 PM  Other:  6/2/20163:38 PM  Other:  6/2/20163:38 PM  Other:   6/2/20163:38 PM   Scribe for Treatment Team:   Carmell Austria T, 07/18/2014, 3:38 PM

## 2014-07-18 NOTE — Progress Notes (Signed)
Patient continues to exhibit delusional, confused thinking with fair response to reorientation. He is very concerned about ability to return home given weakened status and decreased independence in mobility.  Patient was encouraged to walk as tolerated. He is using walker, gait belt and staff within arms length. Patient remains on 1:1 given high falls status. Will continue with current treatment plan and monitor response.

## 2014-07-18 NOTE — [facility] Group Notes (Signed)
[place] Group Notes:  (Nursing/MHT/Case Management/Adjunct)  Date:  07/18/2014  Time:  1:51 PM  Type of Therapy:  Group Therapy  Participation Level:  Minimal  Participation Quality:  Attentive  Affect:  Flat  Cognitive:  Appropriate  Insight:  Improving  Engagement in Group:  Engaged  Modes of Intervention:  Discussion  Summary of Progress/Problems:  Drake Leach 07/18/2014, 1:51 PM
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
## 2014-07-18 NOTE — Progress Notes (Signed)
Recreation Therapy Notes  Date: 06.02.16 Time: 3:30 pm Location: Craft Room  Group Topic: Leisure Education/Coping Skills  Goal Area(s) Addresses: Patient will identify things they are grateful for. Patient will identify why it is important to be grateful.   Behavioral Response: Did not attend  Intervention: Grateful Wheel  Activity: Patients were given a "I am Grateful For" worksheet and instructed to list 2-3 things they were grateful for under each category.  Education: LRT educated patients on why it is important to be grateful.   Education Outcome: Patient did not attend group.  Clinical Observations/Feedback: Patient did not attend group.  Leonette Monarch, LRT/CTRS 07/18/2014 4:17 PM

## 2014-07-18 NOTE — Progress Notes (Signed)
Recreation Therapy Notes  INPATIENT RECREATION THERAPY ASSESSMENT  Patient Details Name: Patrick Holt MRN: [id number] DOB: [birth year] Today's Date: 07/18/2014  Patient Stressors: Family, Other (Comment) (The situation he is in. Check book is missing)  Coping Skills:   Isolate, Avoidance, Art/Dance, Talking, Music, Sports, Other (Comment) (Pray, read Bibe, watch TCT)  Personal Challenges: Concentration, Decision-Making, Expressing Yourself, Relationships, Self-Esteem/Confidence, Social Interaction, Stress Management, Trusting Others  Leisure Interests (2+):  Individual - TV, Individual - Other (Comment) (Pray, read Bible)  Awareness of Community Resources:  No  Community Resources:     Current Use:    If no, Barriers?:    Patient Strengths:  Alive after all he had been through  Patient Identified Areas of Improvement:  To stop being fearful  Current Recreation Participation:  Reading Bible, praying, watching TV  Patient Goal for Hospitalization:  To get back on his feet, find a new place to live, and get furniture moved without any problems  [place] of Residence:  [place] of Residence:  [place]   Current [place] (including self-harm):  No  Current [place]:  No  Consent to Intern Participation: N/A   Leonette Monarch, LRT/CTRS 07/18/2014, 12:58 PM

## 2014-07-18 NOTE — Consult Note (Signed)
Patient Demographics  Patrick Holt, is a 64 y.o. male   MRN: [id number]   DOB - 1951-01-03  Admit Date - 07/16/2014    Outpatient Primary MD for the patient is Howard Pouch, DO  Consult requested in the Hospital by Clovis Fredrickson, MD, On 07/18/2014    Reason for consult;cough   With History of -  Past Medical History  Diagnosis Date  . Rheumatoid arthritis(714.0)   . Obesity   . Major depression     with acute psychotic break in 06/2010  . Hypertension   . Hyperlipidemia   . Diverticulosis of colon   . COPD (chronic obstructive pulmonary disease)   . Anxiety   . GERD (gastroesophageal reflux disease)   . Vertigo   . Fibromyalgia   . Dermatomyositis   . Myocardial infarct     mulitple (1999, 2000, 2004)  . Raynaud's disease   . Narcotic dependence   . Peripheral neuropathy   . Internal hemorrhoids   . Ischemic heart disease   . Hiatal hernia   . Gastritis   . Diverticulitis   . Hx of adenomatous colonic polyps   . Nephrolithiasis   . Anemia   . Esophageal stricture   . Esophageal dysmotility   . Dermatomyositis   . Urge incontinence   . Otosclerosis   . Bipolar 1 disorder   . OCD (obsessive compulsive disorder)   . Sarcoidosis   . Paroxysmal a-fib   . Dysrhythmia     "irregular" (11/15/2012)  . Type II diabetes mellitus   . Seizures   . Headache(784.0)     "severe; get them often" (11/15/2012)  . Subarachnoid hemorrhage 01/2012    with subdural  hematoma.   . Atrial fibrillation 01/2012    with RVR  . Conversion disorder 06/2010  . History of narcotic addiction   . Asthma   . Cancer   . Collagen vascular disease   . Renal insufficiency       Past Surgical History  Procedure Laterality Date  . Knee arthroscopy w/ meniscal repair Left 2009  . Lumbar disc surgery    . Squamous  papilloma   2010    removed by Dr. Constance Holster ENT, noted on tongue  . Esophagogastroduodenoscopy N/A 09/27/2012    Procedure: ESOPHAGOGASTRODUODENOSCOPY (EGD);  Surgeon: Lafayette Dragon, MD;  Location: [facility];  Service: Endoscopy;  Laterality: N/A;  . Colonoscopy N/A 09/27/2012    Procedure: COLONOSCOPY;  Surgeon: Lafayette Dragon, MD;  Location: [facility];  Service: Endoscopy;  Laterality: N/A;  . Cataract extraction w/ intraocular lens implant Left   . Lymph node dissection Right 1970's    "neck; dr thought I had Hodgkins; turned out to be sarcoidosis" (11/15/2012)  . Tonsillectomy    . Carpal tunnel release Bilateral   . Cardiac catheterization    . Back surgery      in for   No chief complaint on file.    HPI  Patrick Holt  is a 64 y.o.  male,  With depression/delusions medicine is consulted for cough/dermatomyositis.    Review of Systems    In addition to the HPI above, No Fever-chills, No Headache, No changes with Vision or hearing, No problems swallowing food or Liquids, No Chest pain, Cough or Shortness of Breath, No Abdominal pain, No Nausea or Vommitting, Bowel movements are regular, No Blood in stool or Urine, No dysuria, No new skin rashes or bruises, No new joints pains-aches,  No new weakness, tingling, numbness in any extremity, No recent weight gain or loss, No polyuria, polydypsia or polyphagia, No significant Mental Stressors.  A full 10 point Review of Systems was done, except as stated above, all other Review of Systems were negative.   Social History History  Substance Use Topics  . Smoking status: Current Every Day Smoker -- 30 years    Types: Cigarettes  . Smokeless tobacco: Never Used     Comment: 3 cigs a day  . Alcohol Use: No     Comment: Alcohol stopped in September of 2014     Family History Family History  Problem Relation Age of Onset  . Alcohol abuse Mother   . Depression Mother   . Heart disease Mother   . Diabetes Mother    . Stroke Mother   . Diabetes Other     1/2 brother  . Hepatitis Brother   . Coronary artery disease Father      Prior to Admission medications   Medication Sig Start Date End Date Taking? Authorizing Provider  albuterol-ipratropium (COMBIVENT) 18-103 MCG/ACT inhaler Inhale 1 puff into the lungs daily. 06/20/13   Gerda Diss, DO  amiodarone (PACERONE) 200 MG tablet Take 1 tablet (200 mg total) by mouth daily. 07/02/14   Archie Patten, MD  aspirin EC 81 MG tablet Take 81 mg by mouth daily.    Historical Provider, MD  atorvastatin (LIPITOR) 40 MG tablet TAKE 1 TABLET BY MOUTH DAILY 04/29/14   Renee A Kuneff, DO  divalproex (DEPAKOTE ER) 500 MG 24 hr tablet Take 1 tablet (500 mg total) by mouth daily. 07/02/14   Archie Patten, MD  doxycycline (VIBRA-TABS) 100 MG tablet Take 1 tablet (100 mg total) by mouth every 12 (twelve) hours. 07/16/14 07/19/14  Olam Idler, MD  levETIRAcetam (KEPPRA) 500 MG tablet Take 1 tablet (500 mg total) by mouth 2 (two) times daily. 07/02/14   Archie Patten, MD  Multiple Vitamin (MULTIVITAMIN WITH MINERALS) TABS tablet Take 1 tablet by mouth daily. 12/29/12   Kandis Nab, MD  NEXIUM 40 MG capsule TAKE ONE CAPSULE BY MOUTH EVERY MORNING WITH BREAKFAST 03/07/14   Renee A Kuneff, DO  ondansetron (ZOFRAN ODT) 4 MG disintegrating tablet 66m ODT q4 hours prn nausea/vomit Patient not taking: Reported on 03/29/2014 10/09/13   JElnora Morrison MD  polyethylene glycol powder (GLYCOLAX/MIRALAX) powder Take 17 g by mouth daily. 06/25/14   Renee A Kuneff, DO  potassium chloride SA (K-DUR,KLOR-CON) 20 MEQ tablet Take 1 tablet (20 mEq total) by mouth daily. 07/02/14   CArchie Patten MD  risperiDONE (RISPERDAL) 1 MG tablet Take 1 tablet (1 mg total) by mouth 2 (two) times daily. 07/16/14   JOlam Idler MD    Anti-infectives    Start     Dose/Rate Route Frequency Ordered Stop   07/16/14 2200  doxycycline (VIBRA-TABS) tablet 100 mg     100 mg Oral Every 12 hours  07/16/14 2000        Scheduled Meds: .  amiodarone  200 mg Oral Daily  . aspirin EC  81 mg Oral Daily  . cyanocobalamin  1,000 mcg Intramuscular Daily  . divalproex  500 mg Oral Daily  . doxycycline  100 mg Oral Q12H  . folic acid  1 mg Oral QAC breakfast  . ipratropium-albuterol  3 mL Inhalation Daily  . levETIRAcetam  500 mg Oral BID  . multivitamin with minerals  1 tablet Oral Daily  . pantoprazole  40 mg Oral Daily  . polyethylene glycol  17 g Oral Daily  . risperiDONE  1 mg Oral BID   Continuous Infusions:  PRN Meds:.acetaminophen, alum & mag hydroxide-simeth, magnesium hydroxide  Allergies  Allergen Reactions  . Immune Globulins Other (See Comments)    Acute renal failure  . Ciprofloxacin Swelling  . Flagyl [Metronidazole] Swelling  . Lisinopril Diarrhea  . Sulfa Antibiotics Other (See Comments)    blisters  . Betamethasone Dipropionate Other (See Comments)    Unknown  . Bupropion Hcl Other (See Comments)    Unknown  . Clobetasol Other (See Comments)    Unknown  . Codeine Other (See Comments)    Unknown  . Escitalopram Oxalate Other (See Comments)    Unknown  . Fluoxetine Hcl Other (See Comments)    Unknown  . Fluticasone-Salmeterol Other (See Comments)    Unknown  . Furosemide Other (See Comments)    Unknown  . Paroxetine Other (See Comments)    Unknown  . Penicillins Other (See Comments)    Unknown  . Tacrolimus Other (See Comments)    Unknown  . Tetanus Toxoid Other (See Comments)    Unknown  . Tuberculin Purified Protein Derivative Other (See Comments)    Unknown    Physical Exam  Vitals  Blood pressure 138/75, pulse 69, temperature 98.3 F (36.8 C), temperature source Oral, resp. rate 20, height '5\' 5"'  (1.651 m), weight 107.049 kg (236 lb), SpO2 92 %.   1.seen in [facility],c/p weaknes in legs,blader and bowel incontinence  2. Normal affect and insight, Not Suicidal or Homicidal, Awake Alert, Oriented X 3.  3. No F.N deficits, ALL C.Nerves  Intact, Strength 5/5 all 4 extremities, Sensation intact all 4 extremities, Plantars down going.  4. Ears and Eyes appear Normal, Conjunctivae clear, PERRLA. Moist Oral Mucosa.  5. Supple Neck, No JVD, No cervical lymphadenopathy appriciated, No Carotid Bruits.  6. Symmetrical Chest wall movement, Good air movement bilaterally, CTAB.  7. RRR, No Gallops, Rubs or Murmurs, No Parasternal Heave.  8. Positive Bowel Sounds, Abdomen Soft, No tenderness, No organomegaly appriciated,No rebound -guarding or rigidity.  9.  No Cyanosis, Normal Skin Turgor, No Skin Rash or Bruise.  10. Good muscle tone,  joints appear normal , no effusions, Normal ROM.  11. No Palpable Lymph Nodes in Neck or Axillae   Data Review  CBC No results for input(s): WBC, HGB, HCT, PLT, MCV, MCH, MCHC, RDW, LYMPHSABS, MONOABS, EOSABS, BASOSABS, BANDABS in the last 168 hours.  Invalid input(s): NEUTRABS, BANDSABD ------------------------------------------------------------------------------------------------------------------  Chemistries   Recent Labs Lab 07/13/14 1142  NA 141  K 3.5  CL 106  CO2 26  GLUCOSE 100*  BUN 18  CREATININE 0.80  CALCIUM 9.0   ------------------------------------------------------------------------------------------------------------------ estimated creatinine clearance is 103.8 mL/min (by C-G formula based on Cr of 0.8). ------------------------------------------------------------------------------------------------------------------ No results for input(s): TSH, T4TOTAL, T3FREE, THYROIDAB in the last 72 hours.  Invalid input(s): FREET3   Coagulation profile No results for input(s): INR, PROTIME in the last 168 hours. ------------------------------------------------------------------------------------------------------------------- No results for  input(s): DDIMER in the last 72  hours. -------------------------------------------------------------------------------------------------------------------  Cardiac Enzymes No results for input(s): CKMB, TROPONINI, MYOGLOBIN in the last 168 hours.  Invalid input(s): CK ------------------------------------------------------------------------------------------------------------------ Invalid input(s): POCBNP   ---------------------------------------------------------------------------------------------------------------  Urinalysis    Component Value Date/Time   COLORURINE YELLOW* 07/17/2014 0850   APPEARANCEUR HAZY* 07/17/2014 0850   LABSPEC 1.018 07/17/2014 0850   PHURINE 7.0 07/17/2014 0850   GLUCOSEU NEGATIVE 07/17/2014 0850   HGBUR 1+* 07/17/2014 0850   HGBUR negative 12/11/2009 1356   BILIRUBINUR NEGATIVE 07/17/2014 0850   BILIRUBINUR SMALL 12/07/2013 0957   KETONESUR TRACE* 07/17/2014 0850   PROTEINUR NEGATIVE 07/17/2014 0850   PROTEINUR 30 12/07/2013 0957   UROBILINOGEN 1.0 07/05/2014 1527   UROBILINOGEN 1.0 12/07/2013 0957   NITRITE NEGATIVE 07/17/2014 0850   NITRITE NEG 12/07/2013 0957   LEUKOCYTESUR NEGATIVE 07/17/2014 0850     Imaging results:   Dg Chest 2 View  07/17/2014   CLINICAL DATA:  Acute onset of cough. Atrial fibrillation. Initial encounter.  EXAM: CHEST  2 VIEW  COMPARISON:  None.  FINDINGS: The lungs are well-aerated. Mild vascular congestion is noted. Increased interstitial markings may reflect mild interstitial edema. A small right pleural effusion is noted. No pneumothorax is seen.  The heart is borderline enlarged. No acute osseous abnormalities are identified.  IMPRESSION: Mild vascular congestion and borderline cardiomegaly. Increased interstitial markings may reflect mild interstitial edema. Small right pleural effusion noted.   Electronically Signed   By: Garald Balding M.D.   On: 07/17/2014 03:04        Assessment & Plan  Principal Problem:   Psychotic  disorder  Community aqured pneumonia;on doxycycline,chest xray is negative  For pneumonia, continue another 3 days doxycycline. 2.h/o dermatomyositis;was on predsnione and methotrexate. Before.but stopped due to  Side effects.sees dr.[facility].now has weakness in legs and he thinks he is getting flare up,consult rhumatlogy,check ESR 3.h/o depression/delusional thinking;psyh is  following 4/h/o afib;on amiodarone;rate controlled 5.CAd GErd  DVT Prophylaxis ambulatory  AM Labs Ordered, also please review Full Orders  Family Communication: Plan discussed with patient   Thank you for the consult, we will follow the patient with you in the Hospital.   [facility] M.D on 07/18/2014 at 12:53 PM

## 2014-07-18 NOTE — Progress Notes (Signed)
D: Patient denies SI/HI/AVH.  Patient affect is sad. Mood is depressed.  Patient did NOT attend evening group. Patient remained in his room throughout the night with the sitter at bedside.  No distress noted. A: Support and encouragement offered. Scheduled medications given to pt. 1:1 safety observation continued for patient safety. R: Patient receptive. Patient remains safe on the unit.
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
## 2014-07-19 NOTE — Progress Notes (Signed)
Patient alert and oriented. His delusional thinking is decreasing with reality orientation and medications. Patient continues to require assistance with all ADLs, transferring, and mobility. Patient is worried about after care and how he will manage. At this time, he is on 1:1 supervision for safety. Continue current treatment plan. Reassure patient as to ongoing discharge planning with family involvement. Moniotr mood, mental status, and response to treatment. Encourage independence in ADLs as tolerated.

## 2014-07-19 NOTE — [facility] Group Notes (Signed)
[place] Group Notes:  (Nursing/MHT/Case Management/Adjunct)  Date:  07/19/2014  Time:  1:16 AM  Type of Therapy:  Group Therapy  Participation Level:  Active  Participation Quality:  Appropriate  Affect:  Appropriate  Cognitive:  Appropriate  Insight:  Good  Engagement in Group:  Engaged  Modes of Intervention:  n/a  Summary of Progress/Problems:  Patrick Holt 07/19/2014, 1:16 AM

## 2014-07-19 NOTE — [facility] Group Notes (Signed)
[facility] LCSW Aftercare Discharge Planning Group Note  07/19/2014 11:20 AM  Participation Quality:  Appropriate and Attentive  Affect:  Appropriate  Cognitive:  Alert, Appropriate and Oriented  Insight:  Improving  Engagement in Group:  Improving  Modes of Intervention:  Problem-solving, Socialization and Support  Summary of Progress/Problems: Patient attended and participated in group appropriately sharing that his SMART goal is to "walk and get around by myself".  Carmell Austria T 07/19/2014, 11:20 AM

## 2014-07-19 NOTE — Progress Notes (Signed)
D: Patient denies SI/HI/AVH.  Patient affect appropriate and his mood is depressed.  Patient did attend evening group. Patient visible on the milieu. No distress noted. 1:1 sitter with patient for safety.  A: Support and encouragement offered. Scheduled medications given to pt. 1:1 observation continued for patient safety. R: Patient receptive. Patient remains safe on the unit.

## 2014-07-19 NOTE — Progress Notes (Signed)
Recreation Therapy Notes  Date: 06.03.16 Time: 3:00 pm Location: Craft Room  Group Topic: Self-expression/Coping skills  Goal Area(s) Addresses:  Patient will effectively use art as a means of self-expression. Patient will recognize positive benefit of self-expression. Patient will be able to identify one emotion experienced during group session. Patient will identify use of art as a coping skill.  Behavioral Response: Did not attend  Intervention: Two Faces of Me  Activity: Patients were given a blank face worksheet and instructed to draw a line down the middle. On one side of the face, patients were instructed to draw or write how they felt when they were admitted. On the other side of the face, patient were instructed to draw or write how they want to feel when they are d/c.  Education: LRT educated patients on how art is an important coping skill.   Education Outcome: Patient did not attend group.  Clinical Observations/Feedback: Patient did not attend group.  Leonette Monarch, LRT/CTRS 07/19/2014 4:04 PM

## 2014-07-19 NOTE — Plan of Care (Signed)
Problem: [facility] Participation in Recreation Therapeutic Interventions Goal: STG-Patient will demonstrate improved self esteem by identif STG: Self-Esteem - Within 4 treatment sessions, patient will verbalize at least 5 positive affirmation statements in each of 2 treatment sessions to increase self-esteem post d/c.  Outcome: Progressing Treatment Session 1; Completed 1 out of 2: At approximately 11:25 am, LRT met with patient in patient room. Patient verbalized 5 positive affirmation statements. Patient reported it felt "good". LRT encouraged patient to continue saying positive affirmation statements.  Leonette Monarch, LRT/CTRS 06.03.16 12:53 pm

## 2014-07-20 ENCOUNTER — Encounter: Payer: Self-pay | Admitting: Psychiatry

## 2014-07-20 MED ORDER — OXYBUTYNIN CHLORIDE 5 MG PO TABS
5.0000 mg | ORAL_TABLET | Freq: Three times a day (TID) | ORAL | Status: DC
  Administered 2014-07-20 – 2014-07-23 (×10): 5 mg via ORAL
  Filled 2014-07-20 (×10): qty 1

## 2014-07-20 NOTE — Plan of Care (Signed)
Problem: Alteration in thought process Goal: STG-Patient is able to discuss thoughts with staff Outcome: Progressing Patient is able to express needs and wants to staff.

## 2014-07-20 NOTE — [facility] Group Notes (Signed)
[facility] Group Notes:  (Nursing/MHT/Case Management/Adjunct)  Date:  07/20/2014  Time:  10:33 AM  Type of Therapy:  Community Meeting   Participation Level:  Active  Participation Quality:  Appropriate and Attentive  Affect:  Appropriate  Cognitive:  Alert, Appropriate and Oriented  Insight:  Appropriate  Engagement in Group:  Engaged  Modes of Intervention:  Discussion  Summary of Progress/Problems:  Patrick Holt 07/20/2014, 10:33 AM

## 2014-07-20 NOTE — Progress Notes (Signed)
Patient alert and oriented. Affect flat. Patient continues to require assistance with  ADLs, transferring, and mobility.  Continue current treatment plan. Reassure patient as to ongoing discharge planning with family involvement. Moniotr mood, mental status, and response to treatment. Encourage independence in ADLs as tolerated

## 2014-07-20 NOTE — Progress Notes (Signed)
Pt  Visible on the unit. Continues to be anxious and needy at times. Hoping to go to assisted living . Denies suicidal thoughts. Med compliant. Appears to be resting at this time.

## 2014-07-20 NOTE — Consult Note (Signed)
Patient Demographics  Patrick Holt, is a 64 y.o. male   MRN: [id number]   DOB - 02/02/1951  Admit Date - 07/16/2014    Outpatient Primary MD for the patient is Howard Pouch, DO  Consult requested in the Hospital by Clovis Fredrickson, MD, On 07/20/2014    Reason for consult;cough   With History of -  Past Medical History  Diagnosis Date  . Rheumatoid arthritis(714.0)   . Obesity   . Major depression     with acute psychotic break in 06/2010  . Hypertension   . Hyperlipidemia   . Diverticulosis of colon   . COPD (chronic obstructive pulmonary disease)   . Anxiety   . GERD (gastroesophageal reflux disease)   . Vertigo   . Fibromyalgia   . Dermatomyositis   . Myocardial infarct     mulitple (1999, 2000, 2004)  . Raynaud's disease   . Narcotic dependence   . Peripheral neuropathy   . Internal hemorrhoids   . Ischemic heart disease   . Hiatal hernia   . Gastritis   . Diverticulitis   . Hx of adenomatous colonic polyps   . Nephrolithiasis   . Anemia   . Esophageal stricture   . Esophageal dysmotility   . Dermatomyositis   . Urge incontinence   . Otosclerosis   . Bipolar 1 disorder   . OCD (obsessive compulsive disorder)   . Sarcoidosis   . Paroxysmal a-fib   . Dysrhythmia     "irregular" (11/15/2012)  . Type II diabetes mellitus   . Seizures   . Headache(784.0)     "severe; get them often" (11/15/2012)  . Subarachnoid hemorrhage 01/2012    with subdural  hematoma.   . Atrial fibrillation 01/2012    with RVR  . Conversion disorder 06/2010  . History of narcotic addiction   . Asthma   . Cancer   . Collagen vascular disease   . Renal insufficiency       Past Surgical History  Procedure Laterality Date  . Knee arthroscopy w/ meniscal repair Left 2009  . Lumbar disc surgery    . Squamous  papilloma   2010    removed by Dr. Constance Holster ENT, noted on tongue  . Esophagogastroduodenoscopy N/A 09/27/2012    Procedure: ESOPHAGOGASTRODUODENOSCOPY (EGD);  Surgeon: Lafayette Dragon, MD;  Location: [facility];  Service: Endoscopy;  Laterality: N/A;  . Colonoscopy N/A 09/27/2012    Procedure: COLONOSCOPY;  Surgeon: Lafayette Dragon, MD;  Location: [facility];  Service: Endoscopy;  Laterality: N/A;  . Cataract extraction w/ intraocular lens implant Left   . Lymph node dissection Right 1970's    "neck; dr thought I had Hodgkins; turned out to be sarcoidosis" (11/15/2012)  . Tonsillectomy    . Carpal tunnel release Bilateral   . Cardiac catheterization    . Back surgery      in for   No chief complaint on file.    HPI  Patrick Holt  is a 64 y.o.  male,  With depression/delusions medicine is consulted for cough/dermatomyositis. Seen today, says he feels better.c/o OAB.   Review of Systems    In addition to the HPI above, No Fever-chills, No Headache, No changes with Vision or hearing, No problems swallowing food or Liquids, No Chest pain, Cough or Shortness of Breath, No Abdominal pain, No Nausea or Vommitting, Bowel movements are regular, No Blood in stool or Urine, No dysuria, No new skin rashes or bruises, No new joints pains-aches,  No new weakness, tingling, numbness in any extremity, No recent weight gain or loss, No polyuria, polydypsia or polyphagia, No significant Mental Stressors.  A full 10 point Review of Systems was done, except as stated above, all other Review of Systems were negative.   Social History History  Substance Use Topics  . Smoking status: Current Every Day Smoker -- 30 years    Types: Cigarettes  . Smokeless tobacco: Never Used     Comment: 3 cigs a day  . Alcohol Use: No     Comment: Alcohol stopped in September of 2014     Family History Family History  Problem Relation Age of Onset  . Alcohol abuse Mother   . Depression Mother   .  Heart disease Mother   . Diabetes Mother   . Stroke Mother   . Diabetes Other     1/2 brother  . Hepatitis Brother   . Coronary artery disease Father      Prior to Admission medications   Medication Sig Start Date End Date Taking? Authorizing Provider  albuterol-ipratropium (COMBIVENT) 18-103 MCG/ACT inhaler Inhale 1 puff into the lungs daily. 06/20/13   Gerda Diss, DO  amiodarone (PACERONE) 200 MG tablet Take 1 tablet (200 mg total) by mouth daily. 07/02/14   Archie Patten, MD  aspirin EC 81 MG tablet Take 81 mg by mouth daily.    Historical Provider, MD  atorvastatin (LIPITOR) 40 MG tablet TAKE 1 TABLET BY MOUTH DAILY 04/29/14   Renee A Kuneff, DO  divalproex (DEPAKOTE ER) 500 MG 24 hr tablet Take 1 tablet (500 mg total) by mouth daily. 07/02/14   Archie Patten, MD  doxycycline (VIBRA-TABS) 100 MG tablet Take 1 tablet (100 mg total) by mouth every 12 (twelve) hours. 07/16/14 07/19/14  Olam Idler, MD  levETIRAcetam (KEPPRA) 500 MG tablet Take 1 tablet (500 mg total) by mouth 2 (two) times daily. 07/02/14   Archie Patten, MD  Multiple Vitamin (MULTIVITAMIN WITH MINERALS) TABS tablet Take 1 tablet by mouth daily. 12/29/12   Kandis Nab, MD  NEXIUM 40 MG capsule TAKE ONE CAPSULE BY MOUTH EVERY MORNING WITH BREAKFAST 03/07/14   Renee A Kuneff, DO  ondansetron (ZOFRAN ODT) 4 MG disintegrating tablet 4mg  ODT q4 hours prn nausea/vomit Patient not taking: Reported on 03/29/2014 10/09/13   Elnora Morrison, MD  polyethylene glycol powder (GLYCOLAX/MIRALAX) powder Take 17 g by mouth daily. 06/25/14   Renee A Kuneff, DO  potassium chloride SA (K-DUR,KLOR-CON) 20 MEQ tablet Take 1 tablet (20 mEq total) by mouth daily. 07/02/14   Archie Patten, MD  risperiDONE (RISPERDAL) 1 MG tablet Take 1 tablet (1 mg total) by mouth 2 (two) times daily. 07/16/14   Olam Idler, MD    Anti-infectives    Start     Dose/Rate Route Frequency Ordered Stop   07/16/14 2200  doxycycline (VIBRA-TABS) tablet 100  mg     100 mg Oral Every 12 hours 07/16/14 2000  Scheduled Meds: . amiodarone  200 mg Oral Daily  . aspirin EC  81 mg Oral Daily  . divalproex  500 mg Oral Daily  . doxycycline  100 mg Oral Q12H  . folic acid  1 mg Oral QAC breakfast  . ipratropium-albuterol  3 mL Inhalation Daily  . levETIRAcetam  500 mg Oral BID  . multivitamin with minerals  1 tablet Oral Daily  . pantoprazole  40 mg Oral Daily  . polyethylene glycol  17 g Oral Daily  . risperiDONE  1 mg Oral BID   Continuous Infusions:  PRN Meds:.acetaminophen, alum & mag hydroxide-simeth, magnesium hydroxide  Allergies  Allergen Reactions  . Immune Globulins Other (See Comments)    Acute renal failure  . Ciprofloxacin Swelling  . Flagyl [Metronidazole] Swelling  . Lisinopril Diarrhea  . Sulfa Antibiotics Other (See Comments)    blisters  . Betamethasone Dipropionate Other (See Comments)    Unknown  . Bupropion Hcl Other (See Comments)    Unknown  . Clobetasol Other (See Comments)    Unknown  . Codeine Other (See Comments)    Unknown  . Escitalopram Oxalate Other (See Comments)    Unknown  . Fluoxetine Hcl Other (See Comments)    Unknown  . Fluticasone-Salmeterol Other (See Comments)    Unknown  . Furosemide Other (See Comments)    Unknown  . Paroxetine Other (See Comments)    Unknown  . Penicillins Other (See Comments)    Unknown  . Tacrolimus Other (See Comments)    Unknown  . Tetanus Toxoid Other (See Comments)    Unknown  . Tuberculin Purified Protein Derivative Other (See Comments)    Unknown    Physical Exam  Vitals  Blood pressure 158/91, pulse 79, temperature 98.8 F (37.1 C), temperature source Oral, resp. rate 20, height 5\' 5"  (1.651 m), weight 107.049 kg (236 lb), SpO2 93 %.   1.seen in [facility],c/p weaknes in legs,blader and bowel incontinence  2. Normal affect and insight, Not Suicidal or Homicidal, Awake Alert, Oriented X 3.  3. No F.N deficits, ALL C.Nerves Intact, Strength 5/5  all 4 extremities, Sensation intact all 4 extremities, Plantars down going.  4. Ears and Eyes appear Normal, Conjunctivae clear, PERRLA. Moist Oral Mucosa.  5. Supple Neck, No JVD, No cervical lymphadenopathy appriciated, No Carotid Bruits.  6. Symmetrical Chest wall movement, Good air movement bilaterally, CTAB.  7. RRR, No Gallops, Rubs or Murmurs, No Parasternal Heave.  8. Positive Bowel Sounds, Abdomen Soft, No tenderness, No organomegaly appriciated,No rebound -guarding or rigidity.  9.  No Cyanosis, Normal Skin Turgor, No Skin Rash or Bruise.  10. Good muscle tone,  joints appear normal , no effusions, Normal ROM.  11. No Palpable Lymph Nodes in Neck or Axillae   Data Review  CBC No results for input(s): WBC, HGB, HCT, PLT, MCV, MCH, MCHC, RDW, LYMPHSABS, MONOABS, EOSABS, BASOSABS, BANDABS in the last 168 hours.  Invalid input(s): NEUTRABS, BANDSABD ------------------------------------------------------------------------------------------------------------------  Chemistries  No results for input(s): NA, K, CL, CO2, GLUCOSE, BUN, CREATININE, CALCIUM, MG, AST, ALT, ALKPHOS, BILITOT in the last 168 hours.  Invalid input(s): GFRCGP ------------------------------------------------------------------------------------------------------------------ estimated creatinine clearance is 103.8 mL/min (by C-G formula based on Cr of 0.8). ------------------------------------------------------------------------------------------------------------------ No results for input(s): TSH, T4TOTAL, T3FREE, THYROIDAB in the last 72 hours.  Invalid input(s): FREET3   Coagulation profile No results for input(s): INR, PROTIME in the last 168 hours. ------------------------------------------------------------------------------------------------------------------- No results for input(s): DDIMER in the last 72  hours. -------------------------------------------------------------------------------------------------------------------  Cardiac  Enzymes No results for input(s): CKMB, TROPONINI, MYOGLOBIN in the last 168 hours.  Invalid input(s): CK ------------------------------------------------------------------------------------------------------------------ Invalid input(s): POCBNP   ---------------------------------------------------------------------------------------------------------------  Urinalysis    Component Value Date/Time   COLORURINE YELLOW* 07/17/2014 0850   APPEARANCEUR HAZY* 07/17/2014 0850   LABSPEC 1.018 07/17/2014 0850   PHURINE 7.0 07/17/2014 0850   GLUCOSEU NEGATIVE 07/17/2014 0850   HGBUR 1+* 07/17/2014 0850   HGBUR negative 12/11/2009 1356   BILIRUBINUR NEGATIVE 07/17/2014 0850   BILIRUBINUR SMALL 12/07/2013 0957   KETONESUR TRACE* 07/17/2014 0850   PROTEINUR NEGATIVE 07/17/2014 0850   PROTEINUR 30 12/07/2013 0957   UROBILINOGEN 1.0 07/05/2014 1527   UROBILINOGEN 1.0 12/07/2013 0957   NITRITE NEGATIVE 07/17/2014 0850   NITRITE NEG 12/07/2013 0957   LEUKOCYTESUR NEGATIVE 07/17/2014 0850     Imaging results:   No results found.      Assessment & Plan  Principal Problem:   Psychotic disorder  Community aqured pneumonia;on doxycycline,chest xray is negative  For pneumonia, continue  On doxycycline. For 2 days 2.h/o dermatomyositis;was on predsnione and methotrexate.  Follow up with rheumatology as  Out pt, 3.h/o depression/delusional thinking;psyh is  following 4/h/o afib;on amiodarone;rate controlled 5.CAd GErd OAB;oxybutin  DVT Prophylaxis ambulatory  AM Labs Ordered, also please review Full Orders  Family Communication: Plan discussed with patient   Thank you for the consult, we will follow the patient with you in the Hospital.   [facility] M.D on 07/20/2014 at 12:27 PM

## 2014-07-20 NOTE — Progress Notes (Signed)
Care taken over at 2300, patient resting in bed at that time.  Patient was incontinent through out the night , he required assistance with cleaning himself. He also needed prompting to move up in the bed. He appears to be sleep at this time.  Will continue to assess.
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
## 2014-07-21 DIAGNOSIS — F22 Delusional disorders: Secondary | ICD-10-CM | POA: Insufficient documentation

## 2014-07-21 NOTE — [facility] Counselor (Signed)
Adult Comprehensive Assessment  Patient ID: Patrick Holt, male   DOB: [birth year], 64 y.o.   MRN: [id number]  Information Source: Information source: Patient  Current Stressors:  Family Relationships: patient reports that his siblings want to harm him Housing / Lack of housing: patient does not feel safe in his apartment, is concerned his neighbors want to harm him  Living/Environment/Situation:  Living Arrangements: Alone Living conditions (as described by patient or guardian): patient lives a lone in an apartment What is atmosphere in current home: Other (Comment) (patient is paranoid and does not feel safe in his apartment and is concerned neighbors will harm him)  Family History:  Marital status: Divorced What types of issues is patient dealing with in the relationship?: patient is divorced and his wife left him with another man Does patient have children?: No  Childhood History:  By whom was/is the patient raised?: Both parents Additional childhood history information: parents are deceased Does patient have siblings?: Yes Number of Siblings: 3 Description of patient's current relationship with siblings: 1brother, 1 sister and 1 step brother-patient reports his siblings want to harm him which is probably not true Did patient suffer any verbal/emotional/physical/sexual abuse as a child?: No Did patient suffer from severe childhood neglect?: No Has patient ever been sexually abused/assaulted/raped as an adolescent or adult?: No Was the patient ever a victim of a crime or a disaster?: No Witnessed domestic violence?: No Has patient been effected by domestic violence as an adult?: No  Education:     Employment/Work Situation:   Employment situation: On disability ($1205 monthly) Has patient ever been in the [employer]?: No Has patient ever served in Recruitment consultant?: No  Financial Resources:   Museum/gallery curator resources: Teacher, early years/pre Does patient have a Programmer, applications or guardian?:  No  Alcohol/Substance Abuse:   What has been your use of drugs/alcohol within the last 12 months?: denies substance use Has alcohol/substance abuse ever caused legal problems?: No  Social Support System:   Heritage manager System: Fair  Leisure/Recreation:      Strengths/Needs:      Discharge Plan:   Does patient have access to transportation?: No Plan for no access to transportation at discharge: cab voucher or ALF will transport Will patient be returning to same living situation after discharge?: No Plan for living situation after discharge: applying for [facility] and ALF placement Currently receiving community mental health services: No (patient denies) If no, would patient like referral for services when discharged?: Yes (What county?) [facility]) Does patient have financial barriers related to discharge medications?: No  Summary/Recommendations:  Patient is a 64 yo divorced wm admitted with delusions and paranoid ideations that his neighbors and siblings want to harm him. Patient does not want to return to his apartment where he has been living alone. Patient is interested in assisted living and [facility] is requested for placement. Patient recevies $1205 monthly disability and has [employer]. Patient's only support is his ex-mother-in-law who visited patient while inpatient. Patient is encouraged to participate in medication management, group therapy, therapeutic milieu.     Carmell Austria T. 07/21/2014
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
## 2014-07-21 NOTE — Plan of Care (Signed)
Problem: Alteration in thought process Goal: LTG-Patient has not harmed self or others in at least 2 days Outcome: Progressing Pt denies suicidal ideations.

## 2014-07-21 NOTE — Plan of Care (Signed)
Problem: Alteration in thought process Goal: LTG-Patient has not harmed self or others in at least 2 days Outcome: Progressing Denies SI

## 2014-07-21 NOTE — Consult Note (Signed)
Patient Demographics  [place] Patrick Holt, is a 64 y.o. male   MRN: [id number]   DOB - [birth year]  Admit Date - 07/16/2014    Outpatient Primary MD for the patient is Howard Pouch, DO  Consult requested in the Hospital by Clovis Fredrickson, MD, On 07/21/2014    Reason for consult;cough   With History of -  Past Medical History  Diagnosis Date  . Rheumatoid arthritis(714.0)   . Obesity   . Major depression     with acute psychotic break in 06/2010  . Hypertension   . Hyperlipidemia   . Diverticulosis of colon   . COPD (chronic obstructive pulmonary disease)   . Anxiety   . GERD (gastroesophageal reflux disease)   . Vertigo   . Fibromyalgia   . Dermatomyositis   . Myocardial infarct     mulitple (1999, 2000, 2004)  . Raynaud's disease   . Narcotic dependence   . Peripheral neuropathy   . Internal hemorrhoids   . Ischemic heart disease   . Hiatal hernia   . Gastritis   . Diverticulitis   . Hx of adenomatous colonic polyps   . Nephrolithiasis   . Anemia   . Esophageal stricture   . Esophageal dysmotility   . Dermatomyositis   . Urge incontinence   . Otosclerosis   . Bipolar 1 disorder   . OCD (obsessive compulsive disorder)   . Sarcoidosis   . Paroxysmal a-fib   . Dysrhythmia     "irregular" (11/15/2012)  . Type II diabetes mellitus   . Seizures   . Headache(784.0)     "severe; get them often" (11/15/2012)  . Subarachnoid hemorrhage 01/2012    with subdural  hematoma.   . Atrial fibrillation 01/2012    with RVR  . Conversion disorder 06/2010  . History of narcotic addiction   . Asthma   . Cancer   . Collagen vascular disease   . Renal insufficiency       Past Surgical History  Procedure Laterality Date  . Knee arthroscopy w/ meniscal repair Left 2009  . Lumbar disc surgery    . Squamous  papilloma   2010    removed by Dr. Constance Holster ENT, noted on tongue  . Esophagogastroduodenoscopy N/A 09/27/2012    Procedure: ESOPHAGOGASTRODUODENOSCOPY (EGD);  Surgeon: Lafayette Dragon, MD;  Location: [facility];  Service: Endoscopy;  Laterality: N/A;  . Colonoscopy N/A 09/27/2012    Procedure: COLONOSCOPY;  Surgeon: Lafayette Dragon, MD;  Location: [facility];  Service: Endoscopy;  Laterality: N/A;  . Cataract extraction w/ intraocular lens implant Left   . Lymph node dissection Right 1970's    "neck; dr thought I had Hodgkins; turned out to be sarcoidosis" (11/15/2012)  . Tonsillectomy    . Carpal tunnel release Bilateral   . Cardiac catheterization    . Back surgery      in for   No chief complaint on file.    HPI  Patrick Holt  is a 64 y.o.  male,  With depression/delusions medicine is consulted for cough/dermatomyositis. Feels better overall.   Review of Systems    In addition to the HPI above, No Fever-chills, No Headache, No changes with Vision or hearing, No problems swallowing food or Liquids, No Chest pain, Cough or Shortness of Breath, No Abdominal pain, No Nausea or Vommitting, Bowel movements are regular, No Blood in stool or Urine, No dysuria, No new skin rashes or bruises, No new joints pains-aches,  No new weakness, tingling, numbness in any extremity, No recent weight gain or loss, No polyuria, polydypsia or polyphagia, No significant Mental Stressors.  A full 10 point Review of Systems was done, except as stated above, all other Review of Systems were negative.   Social History History  Substance Use Topics  . Smoking status: Current Every Day Smoker -- 30 years    Types: Cigarettes  . Smokeless tobacco: Never Used     Comment: 3 cigs a day  . Alcohol Use: No     Comment: Alcohol stopped in September of 2014     Family History Family History  Problem Relation Age of Onset  . Alcohol abuse Mother   . Depression Mother   . Heart disease Mother    . Diabetes Mother   . Stroke Mother   . Diabetes Other     1/2 brother  . Hepatitis Brother   . Coronary artery disease Father      Prior to Admission medications   Medication Sig Start Date End Date Taking? Authorizing Provider  albuterol-ipratropium (COMBIVENT) 18-103 MCG/ACT inhaler Inhale 1 puff into the lungs daily. 06/20/13   Gerda Diss, DO  amiodarone (PACERONE) 200 MG tablet Take 1 tablet (200 mg total) by mouth daily. 07/02/14   Archie Patten, MD  aspirin EC 81 MG tablet Take 81 mg by mouth daily.    Historical Provider, MD  atorvastatin (LIPITOR) 40 MG tablet TAKE 1 TABLET BY MOUTH DAILY 04/29/14   Renee A Kuneff, DO  divalproex (DEPAKOTE ER) 500 MG 24 hr tablet Take 1 tablet (500 mg total) by mouth daily. 07/02/14   Archie Patten, MD  doxycycline (VIBRA-TABS) 100 MG tablet Take 1 tablet (100 mg total) by mouth every 12 (twelve) hours. 07/16/14 07/19/14  Olam Idler, MD  levETIRAcetam (KEPPRA) 500 MG tablet Take 1 tablet (500 mg total) by mouth 2 (two) times daily. 07/02/14   Archie Patten, MD  Multiple Vitamin (MULTIVITAMIN WITH MINERALS) TABS tablet Take 1 tablet by mouth daily. 12/29/12   Kandis Nab, MD  NEXIUM 40 MG capsule TAKE ONE CAPSULE BY MOUTH EVERY MORNING WITH BREAKFAST 03/07/14   Renee A Kuneff, DO  ondansetron (ZOFRAN ODT) 4 MG disintegrating tablet 4mg  ODT q4 hours prn nausea/vomit Patient not taking: Reported on 03/29/2014 10/09/13   Elnora Morrison, MD  polyethylene glycol powder (GLYCOLAX/MIRALAX) powder Take 17 g by mouth daily. 06/25/14   Renee A Kuneff, DO  potassium chloride SA (K-DUR,KLOR-CON) 20 MEQ tablet Take 1 tablet (20 mEq total) by mouth daily. 07/02/14   Archie Patten, MD  risperiDONE (RISPERDAL) 1 MG tablet Take 1 tablet (1 mg total) by mouth 2 (two) times daily. 07/16/14   Olam Idler, MD    Anti-infectives    Start     Dose/Rate Route Frequency Ordered Stop   07/16/14 2200  doxycycline (VIBRA-TABS) tablet 100 mg     100 mg Oral  Every 12 hours 07/16/14 2000  Scheduled Meds: . amiodarone  200 mg Oral Daily  . aspirin EC  81 mg Oral Daily  . divalproex  500 mg Oral Daily  . doxycycline  100 mg Oral Q12H  . folic acid  1 mg Oral QAC breakfast  . ipratropium-albuterol  3 mL Inhalation Daily  . levETIRAcetam  500 mg Oral BID  . multivitamin with minerals  1 tablet Oral Daily  . oxybutynin  5 mg Oral TID  . pantoprazole  40 mg Oral Daily  . polyethylene glycol  17 g Oral Daily  . risperiDONE  1 mg Oral BID   Continuous Infusions:  PRN Meds:.acetaminophen, alum & mag hydroxide-simeth, magnesium hydroxide  Allergies  Allergen Reactions  . Immune Globulins Other (See Comments)    Acute renal failure  . Ciprofloxacin Swelling  . Flagyl [Metronidazole] Swelling  . Lisinopril Diarrhea  . Sulfa Antibiotics Other (See Comments)    blisters  . Betamethasone Dipropionate Other (See Comments)    Unknown  . Bupropion Hcl Other (See Comments)    Unknown  . Clobetasol Other (See Comments)    Unknown  . Codeine Other (See Comments)    Unknown  . Escitalopram Oxalate Other (See Comments)    Unknown  . Fluoxetine Hcl Other (See Comments)    Unknown  . Fluticasone-Salmeterol Other (See Comments)    Unknown  . Furosemide Other (See Comments)    Unknown  . Paroxetine Other (See Comments)    Unknown  . Penicillins Other (See Comments)    Unknown  . Tacrolimus Other (See Comments)    Unknown  . Tetanus Toxoid Other (See Comments)    Unknown  . Tuberculin Purified Protein Derivative Other (See Comments)    Unknown    Physical Exam  Vitals  Blood pressure 112/75, pulse 91, temperature 98.9 F (37.2 C), temperature source Oral, resp. rate 20, height 5\' 5"  (1.651 m), weight 107.049 kg (236 lb), SpO2 94 %.   1.seen in B[facility],c/p weaknes in legs,blader and bowel incontinence  2. Normal affect and insight, Not Suicidal or Homicidal, Awake Alert, Oriented X 3.  3. No F.N deficits, ALL C.Nerves Intact,  Strength 5/5 all 4 extremities, Sensation intact all 4 extremities, Plantars down going.  4. Ears and Eyes appear Normal, Conjunctivae clear, PERRLA. Moist Oral Mucosa.  5. Supple Neck, No JVD, No cervical lymphadenopathy appriciated, No Carotid Bruits.  6. Symmetrical Chest wall movement, Good air movement bilaterally, CTAB.  7. RRR, No Gallops, Rubs or Murmurs, No Parasternal Heave.  8. Positive Bowel Sounds, Abdomen Soft, No tenderness, No organomegaly appriciated,No rebound -guarding or rigidity.  9.  No Cyanosis, Normal Skin Turgor, No Skin Rash or Bruise.  10. Good muscle tone,  joints appear normal , no effusions, Normal ROM.  11. No Palpable Lymph Nodes in Neck or Axillae   Data Review  CBC No results for input(s): WBC, HGB, HCT, PLT, MCV, MCH, MCHC, RDW, LYMPHSABS, MONOABS, EOSABS, BASOSABS, BANDABS in the last 168 hours.  Invalid input(s): NEUTRABS, BANDSABD ------------------------------------------------------------------------------------------------------------------  Chemistries  No results for input(s): NA, K, CL, CO2, GLUCOSE, BUN, CREATININE, CALCIUM, MG, AST, ALT, ALKPHOS, BILITOT in the last 168 hours.  Invalid input(s): GFRCGP ------------------------------------------------------------------------------------------------------------------ estimated creatinine clearance is 103.8 mL/min (by C-G formula based on Cr of 0.8). ------------------------------------------------------------------------------------------------------------------ No results for input(s): TSH, T4TOTAL, T3FREE, THYROIDAB in the last 72 hours.  Invalid input(s): FREET3   Coagulation profile No results for input(s): INR, PROTIME in the last 168 hours. ------------------------------------------------------------------------------------------------------------------- No results for input(s): DDIMER in  the last 72  hours. -------------------------------------------------------------------------------------------------------------------  Cardiac Enzymes No results for input(s): CKMB, TROPONINI, MYOGLOBIN in the last 168 hours.  Invalid input(s): CK ------------------------------------------------------------------------------------------------------------------ Invalid input(s): POCBNP   ---------------------------------------------------------------------------------------------------------------  Urinalysis    Component Value Date/Time   COLORURINE YELLOW* 07/17/2014 0850   APPEARANCEUR HAZY* 07/17/2014 0850   LABSPEC 1.018 07/17/2014 0850   PHURINE 7.0 07/17/2014 0850   GLUCOSEU NEGATIVE 07/17/2014 0850   HGBUR 1+* 07/17/2014 0850   HGBUR negative 12/11/2009 1356   BILIRUBINUR NEGATIVE 07/17/2014 0850   BILIRUBINUR SMALL 12/07/2013 0957   KETONESUR TRACE* 07/17/2014 0850   PROTEINUR NEGATIVE 07/17/2014 0850   PROTEINUR 30 12/07/2013 0957   UROBILINOGEN 1.0 07/05/2014 1527   UROBILINOGEN 1.0 12/07/2013 0957   NITRITE NEGATIVE 07/17/2014 0850   NITRITE NEG 12/07/2013 0957   LEUKOCYTESUR NEGATIVE 07/17/2014 0850     Imaging results:   No results found.      Assessment & Plan  Principal Problem:   Psychotic disorder Active Problems:   Delusional disorder  Community aqured pneumonia;on doxycycline,chest xray is negative  For pneumonia, continue  On doxycycline from May 31. Continue tomorrow to finish 7 days of doxycycline. Symptoms are better chest x-ray is negative. Follow up with rheumatology as  Out pt, for his polymyositis and dermatomyositis. 3.h/o depression/delusional thinking;psyh is  following 4/h/o afib;on amiodarone;rate controlled continue aspirin, amiodarone. 5.CAd 6.GErd 7.OAB;oxybutin restarted  DVT Prophylaxis ambulatory History of seizure disorder continue Keppra. We'll sign off, recall if needed.  AM Labs Ordered, also please review Full  Orders  Family Communication: Plan discussed with patient   Thank you for the consult, we will follow the patient with you in the Hospital.   [facility] M.D on 07/21/2014 at 8:54 AM

## 2014-07-21 NOTE — Progress Notes (Signed)
Patient alert and oriented. Affect flat. Patient continues to require assistance with ADLs, transferring, and mobility.  Continue current treatment plan. Reassure patient as to ongoing discharge planning with family involvement. Moniotr mood, mental status, and response to treatment. Encourage independence in ADLs as tolerated

## 2014-07-21 NOTE — Progress Notes (Signed)
Patient currently resting in bed. No sign of discomfort noted. Staff continue to monitor for safety and other needs.

## 2014-07-22 ENCOUNTER — Ambulatory Visit: Payer: Self-pay | Admitting: Family Medicine

## 2014-07-22 NOTE — [facility] Group Notes (Signed)
[facility] LCSW Aftercare Discharge Planning Group Note  07/22/2014 10:02 AM  Participation Quality:  Appropriate  Affect:  Appropriate  Cognitive:  Appropriate  Insight:  Developing/Improving  Engagement in Group:  Improving  Modes of Intervention:  Discussion, Education and Support  Summary of Progress/Problems:Patients goal for today is to meet with his social work to find out about housing support  Enis Slipper M 07/22/2014, 10:02 AM

## 2014-07-22 NOTE — Progress Notes (Signed)
Patient improving slowly in safe structured environment. He continues to express some mild delusional thinking.  Refusing to attend groups. Patient is very concerned about discharge planning and is irritable that treatment team has not yet found him a place to live. Resistive to education regarding need for appropriate aftercare plans. Will continue to monitor clinical status, response to medications, encourage participation in program, and active participation with treatment team.

## 2014-07-22 NOTE — Progress Notes (Signed)
Patient slept for a few hours and has been awake. Expressed concern related to housing, reporting that he will not return to his current residency upon discharge. Has remained calm and cooperative. Was assisted to the bathroom and was encouraged to call when he needs help. Safety precautions maintained.

## 2014-07-22 NOTE — [facility] Group Notes (Signed)
[facility] LCSW Group Therapy  07/22/2014 5:32 PM  Type of Therapy:  Group Therapy  Participation Level:  Active  Participation Quality:  Attentive  Affect:  Appropriate  Cognitive:  Appropriate  Insight:  Developing/Improving  Engagement in Therapy:  Developing/Improving  Modes of Intervention:  Discussion, Education, Exploration and Support  Summary of Progress/Problems: Patients group therapy topic was stress and anxiety. Pts were all encouraged to share a stressful and anxious moment and then as a collective group discuss ways to reduce stress and anxiety. This patient was kind and supportive to his peers.   Enis Slipper M  07/22/2014, 5:32 PM

## 2014-07-22 NOTE — Progress Notes (Signed)
Recreation Therapy Notes  Date: 06.06.16 Time: 3:00 pm Location: Craft Room  Group Topic: Wellness  Goal Area(s) Addresses:  Patient will identify at least one item per dimension of health. Patient will examine areas they are deficient in.  Behavioral Response: Did not attend   Intervention: 6 Dimensions of Health  Activity: Patients were given a worksheet with the 6 dimensions of health on it. Patients were given a worksheet with the 6 dimensions of health on it and instructed to list at least 1 item under each category.   Education: LRT educated patients on each dimension of health and how they can increase each dimension.  Education Outcome: Patient did not attend group.  Clinical Observations/Feedback: Patient did not attend group.  Leonette Monarch, LRT/CTRS 07/22/2014 4:06 PM

## 2014-07-22 NOTE — [facility] Group Notes (Signed)
[facility] Group Notes:  (Nursing/MHT/Case Management/Adjunct)  Date:  07/22/2014  Time:  11:43 AM  Type of Therapy:  Group Therapy  Participation Level:  Minimal  Participation Quality:  Attentive  Affect:  Flat  Cognitive:  Oriented  Insight:  Good  Engagement in Group:  Limited  Modes of Intervention:  Socialization  Summary of Progress/Problems:  Patrick Holt 07/22/2014, 11:43 AM
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
## 2014-07-22 NOTE — Plan of Care (Signed)
Problem: [facility] Participation in Recreation Therapeutic Interventions Goal: STG-Patient will demonstrate improved self esteem by identif STG: Self-Esteem - Within 4 treatment sessions, patient will verbalize at least 5 positive affirmation statements in each of 2 treatment sessions to increase self-esteem post d/c.  Outcome: Completed/Met Date Met:  07/22/14 Treatment Session 2; Completed 2 out of 2: At approximately 12:00 pm, LRT met with patient in patient room. Patient verbalized 5 positive affirmation statements. Patient reported it felt good. LRT encouraged patient to continue saying positive affirmation statements.  Leonette Monarch, LRT/CTRS 06.06.16 1:38 pm

## 2014-07-22 NOTE — Progress Notes (Signed)
PT Cancellation Note  Patient Details Name: JRAKE RODRIQUEZ MRN: [id number] DOB: 1951-01-20   Cancelled Treatment:    Reason Eval/Treat Not Completed: Other (comment) (Patient currently in group session; will re-attempt at later time/date as patient available and medically appropriate)  Jeramie Scogin H. Owens Shark, PT, DPT 07/22/2014, 11:19 AM [phone]
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
## 2014-07-22 NOTE — Clinical Social Work Note (Signed)
CSW faxed signed consent, H&P, MAR, and Progress Note to [facility] PASRR for PASRR request and will request patient be placed in [place] which patient is agreeable to.

## 2014-07-22 NOTE — [facility] Group Notes (Signed)
[facility] Group Notes:  (Nursing/MHT/Case Management/Adjunct)  Date:  07/22/2014  Time:  3:50 AM  Type of Therapy:  Group Therapy  Participation Level:  Did Not Attend  Participation Quality:  n/a  Affect:  n/a  Cognitive:  n/a  Insight:  None  Engagement in Group:  n/a  Modes of Intervention:  n/a  Summary of Progress/Problems:  Marylynn Pearson 07/22/2014, 3:50 AM

## 2014-07-23 ENCOUNTER — Telehealth: Payer: Self-pay | Admitting: Family Medicine

## 2014-07-23 MED ORDER — OXYBUTYNIN CHLORIDE 5 MG PO TABS: 5.0000 mg | ORAL_TABLET | Freq: Three times a day (TID) | ORAL | Status: DC

## 2014-07-23 MED ORDER — DIVALPROEX SODIUM ER 500 MG PO TB24: 500.0000 mg | ORAL_TABLET | Freq: Every day | ORAL | Status: DC

## 2014-07-23 MED ORDER — RISPERIDONE 1 MG PO TABS: 1.0000 mg | ORAL_TABLET | Freq: Two times a day (BID) | ORAL | Status: DC

## 2014-07-23 NOTE — Progress Notes (Signed)
  [facility] Adult Case Management Discharge Plan :  Will you be returning to the same living situation after discharge:  Yes,  back to his apartment in [place], [place] At discharge, do you have transportation home?: No. cab voucher is provider Do you have the ability to pay for your medications: Yes,  patient has Medicare  Release of information consent forms completed and in the chart;  Patient's signature needed at discharge.  Patient to Follow up at: Follow-up Information    Follow up with [facility] On 10/14/2014.   Why:  For follow-up care; 10/14/14 at 8:15am is earliest available appoint, patient may call the number above to check on cancelations for an earlier appointment that may become available   Contact information:   [place], [place] Ph [phone] Fax [phone]      Patient denies SI/HI: Yes,  patient denies SI/HI    Safety Planning and Suicide Prevention discussed: Yes,  SPE discussed with patient and his ex-mother-in-law Ivin Booty and patient is given resources and SPE brochure  Have you used any form of tobacco in the last 30 days? (Cigarettes, Smokeless Tobacco, Cigars, and/or Pipes): Yes  Has patient been referred to the Quitline?: Patient refused referral  Carmell Austria T 07/23/2014, 2:17 PM
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
## 2014-07-23 NOTE — Progress Notes (Signed)
Recreation Therapy Notes  Date: 06.07.16 Time: 3:00 pm Location: Craft Room  Group Topic: Self-expression  Goal Area(s) Addresses:  Patient will identify one color per emotion listed on wheel. Patient will verbalize benefit of using art as a means of self-expression. Patient will verbalize one emotion experienced during session. Patient will be educated on other forms of self-expression.  Behavioral Response: Attentive, Left early  Intervention: Emotion Wheel  Activity: Patients were given a worksheet with 7 emotions and instructed to pick a color for each emotion.   Education: LRT educated patients on different forms on self-expression.  Education Outcome: Acknowledges education/In group clarification offered  Clinical Observations/Feedback: Patient drew flowers on worksheet. Patient left group at approximately 3:20 pm with nursing. Patient did not return to group.  [facility], LRT/CTRS 07/23/2014 4:03 PM

## 2014-07-23 NOTE — Progress Notes (Signed)
D: Pt denies SI/HI/AVH. Pt is pleasant and cooperative. Pt focused on finding key for his home. Pt says he need to get in touch with his Ex-mother in law, she has a copy of the keys.  A: Pt was offered support and encouragement. Pt was given scheduled medications. Pt was encourage to attend groups. Q 15 minute checks were done for safety.   R:Pt attends groups and interacts well with peers and staff. Pt is taking medication .Pt receptive to treatment and safety maintained on unit.

## 2014-07-23 NOTE — Progress Notes (Signed)
Patient discharged home via cab. Instructions reviewed. Patient verbalized understanding of meds and follow up care. Belongings returned. Denies SI/HI/AVH.

## 2014-07-23 NOTE — Progress Notes (Signed)
Physical Therapy Treatment Patient Details Name: Patrick Holt MRN: [id number] DOB: [birth year] Today's Date: 07/23/2014    History of Present Illness admitted to inpatient psychiatric from [facility] for management of psychological issues.  Initially admitted to [facility] seconadry to AMS/psychosis and cellulitis; found on floor, talking irrationally.    PT Comments    Participated in therapy today, reviewed HEP previously provided and performed x15/ea exercise.  Pt required x1/seated rest break due to fatigue.  Pt able to perform all exercises with min/mod cues for technique.    Follow Up Recommendations  SNF     Equipment Recommendations  Rolling walker with 5" wheels    Recommendations for Other Services       Precautions / Restrictions Precautions Precautions: Fall Restrictions Weight Bearing Restrictions: No    Mobility  Bed Mobility                  Transfers Overall transfer level: Needs assistance Equipment used: Rolling walker (2 wheeled) Transfers: Sit to/from Stand Sit to Stand: Min guard         General transfer comment: poor safety awareness cues for proper sequencing  Ambulation/Gait Ambulation/Gait assistance: Min guard Ambulation Distance (Feet): 150 Feet Assistive device: Rolling walker (2 wheeled)       General Gait Details: slow cadance, wide BOS, decreased foot clearance   Stairs            Wheelchair Mobility    Modified Rankin (Stroke Patients Only)       Balance Overall balance assessment: Needs assistance Sitting-balance support: No upper extremity supported Sitting balance-Leahy Scale: Good     Standing balance support: Bilateral upper extremity supported Standing balance-Leahy Scale: Good                      Cognition Arousal/Alertness: Awake/alert Behavior During Therapy: WFL for tasks assessed/performed Overall Cognitive Status: Difficult to assess Area of Impairment:  Orientation;Memory;Safety/judgement;Awareness;Problem solving                    Exercises Other Exercises Other Exercises: Standing LE therex with RW, 1x15, AROM for muscular strength/endurance: heel raises, mini squats, marching, hip flex/ext/abduct/adduct.  All require bilat UE support for optimal safety.  Intermittent seated rest periods throughout due to LE fatigue.  Provided handout with written/pictorial descriptions of therex; encouraged performance outside of therapy.  Patient voiced understanding.    General Comments        Pertinent Vitals/Pain Pain Assessment: No/denies pain    Home Living                      Prior Function            PT Goals (current goals can now be found in the care plan section) Acute Rehab PT Goals PT Goal Formulation: With patient Time For Goal Achievement: 07/22/14 Potential to Achieve Goals: Fair Progress towards PT goals: Progressing toward goals    Frequency  Min 2X/week    PT Plan Current plan remains appropriate    Co-evaluation             End of Session Equipment Utilized During Treatment: Gait belt Activity Tolerance: Patient tolerated treatment well Patient left: in chair     Time: 1419-1445 PT Time Calculation (min) (ACUTE ONLY): 26 min  Charges:  $Gait Training: 8-22 mins $Therapeutic Exercise: 8-22 mins  G Codes:      Patrick Holt Aug 20, 2014, 2:56 PM  Patrick Holt, PTA

## 2014-07-23 NOTE — Progress Notes (Signed)
Recreation Therapy Notes  INPATIENT RECREATION TR PLAN  Patient Details Name: Patrick Holt MRN: [id number] DOB: 1950-08-08 Today's Date: 07/23/2014  Rec Therapy Plan Is patient appropriate for Therapeutic Recreation?: Yes Treatment times per week: At least 3 times a week TR Treatment/Interventions: 1:1 session, Group participation (Comment) (Appropriate participation in daily recreation therapy tx)  Discharge Criteria Pt will be discharged from therapy if:: Discharged Treatment plan/goals/alternatives discussed and agreed upon by:: Patient/family  Discharge Summary Short term goals set: See Care Plan Short term goals met: Complete Progress toward goals comments: One-to-one attended One-to-one attended: Self-esteem Reason goals not met: N/A Therapeutic equipment acquired: None Reason patient discharged from therapy: Discharge from hospital Pt/family agrees with progress & goals achieved: Yes Date patient discharged from therapy: 07/23/14   [facility], LRT/CTRS 07/23/2014, 12:43 PM

## 2014-07-23 NOTE — Plan of Care (Signed)
Problem: Ineffective individual coping Goal: LTG: Patient will report a decrease in negative feelings Outcome: Not Progressing Pt continues to be depressed and have negative feelings.  Goal: STG: Patient will remain free from self harm Outcome: Progressing Pt safe on the unit  Problem: Alteration in mood; excessive anxiety as evidenced by: Goal: LTG-Patient's behavior demonstrates decreased anxiety (Patient's behavior demonstrates anxiety and he/she is utilizing learned coping skills to deal with anxiety-producing situations)  Outcome: Not Progressing Pt continues to be anxious, and feel depressed
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
## 2014-07-23 NOTE — [facility] Group Notes (Signed)
[name]:  (Nursing/MHT/Case Management/Adjunct)  Date:  07/23/2014  Time:  1:27 AM  Type of Therapy:  Group Therapy  Participation Level:  Did Not Attend  Participation Quality:  n/a  Affect:  n/a  Cognitive:  n/a  Insight:  None  Engagement in Group:  n/a  Modes of Intervention:  n/a  Summary of Progress/Problems:  Patrick Holt 07/23/2014, 1:27 AM

## 2014-07-23 NOTE — [facility] Group Notes (Signed)
[facility] Group Notes:  (Nursing/MHT/Case Management/Adjunct)  Date:  07/23/2014  Time:  2:04 PM  Type of Therapy:  Group Therapy  Participation Level:  Did Not Attend  Celso Amy 07/23/2014, 2:04 PM

## 2014-07-23 NOTE — [facility] Suicide Risk Assessment (Signed)
[place] INPATIENT:  Family/Significant Other Suicide Prevention Education  Suicide Prevention Education:  Education Completed; Patrick Holt (ex-mother-in-law) [phone] is patients only support and has been identified by the patient as the family member/significant other with whom the patient will be residing, and identified as the person(s) who will aid the patient in the event of a mental health crisis (suicidal ideations/suicide attempt).  With written consent from the patient, the family member/significant other has been provided the following suicide prevention education, prior to the and/or following the discharge of the patient.  The suicide prevention education provided includes the following:  Suicide risk factors  Suicide prevention and interventions  National Suicide Hotline telephone number  [facility] assessment telephone number  [facility] Emergency Assistance [place] and/or Residential Mobile Crisis Unit telephone number  Request made of family/significant other to:  Remove weapons (e.g., guns, rifles, knives), all items previously/currently identified as safety concern.    Remove drugs/medications (over-the-counter, prescriptions, illicit drugs), all items previously/currently identified as a safety concern.  The family member/significant other verbalizes understanding of the suicide prevention education information provided.  The family member/significant other agrees to remove the items of safety concern listed above.  Patrick Holt T 07/23/2014, 2:14 PM

## 2014-07-24 NOTE — Care Management Note (Signed)
Case Management Note  Patient Details  Name: Patrick Holt MRN: [id number] Date of Birth: May 13, 1950  Subjective/Objective:                    Action/Plan:   Expected Discharge Date:                  Expected Discharge Plan:     In-House Referral:     Discharge planning Services     Post Acute Care Choice:    Choice offered to:     DME Arranged:    DME Agency:     HH Arranged:    [place] Agency:     Status of Service:     Medicare Important Message Given:    Date Medicare IM Given:    Medicare IM give by:    Date Additional Medicare IM Given:    Additional Medicare Important Message give by:     If discussed at [place] of Stay Meetings, dates discussed:    Additional Comments:  Pricilla Handler 07/24/2014, 10:16 AM

## 2014-07-25 NOTE — Progress Notes (Signed)
[facility] H&P Discharge Summary faxed to [facility]

## 2014-07-25 NOTE — Consult Note (Signed)
Patrick Holt at [facility] NAME: Patrick Holt    MR#:  [id number]  DATE OF BIRTH:  09/16/1950  DATE OF ADMISSION:  (Not on file)  PRIMARY CARE PHYSICIAN: Howard Pouch, DO   REQUESTING/REFERRING PHYSICIAN: Dr. Bary Leriche  CHIEF COMPLAINT:  Consult called for numerous medical issues including cough and recent pneumonia.  HISTORY OF PRESENT ILLNESS:  Patrick Holt  is a 64 y.o. male with a known history of dermatomyositis, major depression with psychotic episode, atrial fibrillation, coronary artery disease, gastroesophageal reflux disease, hyperlipidemia. He was over at [facility] for a while and treated for suspected pneumonia. He was transferred daily [facility] to [facility] psychiatry floor. We  Are consulted for pneumonia/dermatomyositis.seen again today,has cough but no other complaiat   PAST MEDICAL HISTORY:   Past Medical History  Diagnosis Date  . Rheumatoid arthritis(714.0)   . Obesity   . Major depression     with acute psychotic break in 06/2010  . Hypertension   . Hyperlipidemia   . Diverticulosis of colon   . COPD (chronic obstructive pulmonary disease)   . Anxiety   . GERD (gastroesophageal reflux disease)   . Vertigo   . Fibromyalgia   . Dermatomyositis   . Myocardial infarct     mulitple (1999, 2000, 2004)  . Raynaud's disease   . Narcotic dependence   . Peripheral neuropathy   . Internal hemorrhoids   . Ischemic heart disease   . Hiatal hernia   . Gastritis   . Diverticulitis   . Hx of adenomatous colonic polyps   . Nephrolithiasis   . Anemia   . Esophageal stricture   . Esophageal dysmotility   . Dermatomyositis   . Urge incontinence   . Otosclerosis   . Bipolar 1 disorder   . OCD (obsessive compulsive disorder)   . Sarcoidosis   . Paroxysmal a-fib   . Dysrhythmia     "irregular" (11/15/2012)  . Type II diabetes mellitus   . Seizures   . Headache(784.0)     "severe; get them  often" (11/15/2012)  . Subarachnoid hemorrhage 01/2012    with subdural  hematoma.   . Atrial fibrillation 01/2012    with RVR  . Conversion disorder 06/2010  . History of narcotic addiction   . Asthma   . Cancer   . Collagen vascular disease   . Renal insufficiency     PAST SURGICAL HISTOIRY:   Past Surgical History  Procedure Laterality Date  . Knee arthroscopy w/ meniscal repair Left 2009  . Lumbar disc surgery    . Squamous papilloma   2010    removed by Dr. Constance Holster ENT, noted on tongue  . Esophagogastroduodenoscopy N/A 09/27/2012    Procedure: ESOPHAGOGASTRODUODENOSCOPY (EGD);  Surgeon: Lafayette Dragon, MD;  Location: [facility];  Service: Endoscopy;  Laterality: N/A;  . Colonoscopy N/A 09/27/2012    Procedure: COLONOSCOPY;  Surgeon: Lafayette Dragon, MD;  Location: [facility];  Service: Endoscopy;  Laterality: N/A;  . Cataract extraction w/ intraocular lens implant Left   . Lymph node dissection Right 1970's    "neck; dr thought I had Hodgkins; turned out to be sarcoidosis" (11/15/2012)  . Tonsillectomy    . Carpal tunnel release Bilateral   . Cardiac catheterization    . Back surgery      SOCIAL HISTORY:   History  Substance Use Topics  . Smoking status: Current Every Day Smoker -- 30 years    Types:  Cigarettes  . Smokeless tobacco: Never Used     Comment: 3 cigs a day  . Alcohol Use: No     Comment: Alcohol stopped in September of 2014    FAMILY HISTORY:   Family History  Problem Relation Age of Onset  . Alcohol abuse Mother   . Depression Mother   . Heart disease Mother   . Diabetes Mother   . Stroke Mother   . Diabetes Other     1/2 brother  . Hepatitis Brother   . Coronary artery disease Father     DRUG ALLERGIES:   Allergies  Allergen Reactions  . Immune Globulins Other (See Comments)    Acute renal failure  . Ciprofloxacin Swelling  . Flagyl [Metronidazole] Swelling  . Lisinopril Diarrhea  . Sulfa Antibiotics Other (See Comments)    blisters   . Betamethasone Dipropionate Other (See Comments)    Unknown  . Bupropion Hcl Other (See Comments)    Unknown  . Clobetasol Other (See Comments)    Unknown  . Codeine Other (See Comments)    Unknown  . Escitalopram Oxalate Other (See Comments)    Unknown  . Fluoxetine Hcl Other (See Comments)    Unknown  . Fluticasone-Salmeterol Other (See Comments)    Unknown  . Furosemide Other (See Comments)    Unknown  . Paroxetine Other (See Comments)    Unknown  . Penicillins Other (See Comments)    Unknown  . Tacrolimus Other (See Comments)    Unknown  . Tetanus Toxoid Other (See Comments)    Unknown  . Tuberculin Purified Protein Derivative Other (See Comments)    Unknown    REVIEW OF SYSTEMS:  CONSTITUTIONAL: Low-grade fever, sweats. Weight stable. Weakness lower extremities EYES: No blurred or double vision. Wears reading glasses EARS, NOSE, AND THROAT: No tinnitus or ear pain. Positive for runny nose RESPIRATORY: Positive for cough, no shortness of breath, wheezing or hemoptysis.  CARDIOVASCULAR: No chest pain, orthopnea, edema.  GASTROINTESTINAL: No nausea, vomiting, or abdominal pain. Positive diarrhea GENITOURINARY: No dysuria, hematuria. Positive for uncontrollable urination ENDOCRINE: No polyuria, nocturia,  HEMATOLOGY: No anemia, easy bruising or bleeding SKIN: Positive for blisters on skin on the right arm at prior IV site MUSCULOSKELETAL: Positive for leg pain NEUROLOGIC: No tingling, numbness, weakness.  PSYCHIATRY: Positive for depression  MEDICATIONS AT HOME:   Prior to Admission medications   Medication Sig Start Date End Date Taking? Authorizing Provider  albuterol-ipratropium (COMBIVENT) 18-103 MCG/ACT inhaler Inhale 1 puff into the lungs daily. 06/20/13   Gerda Diss, DO  amiodarone (PACERONE) 200 MG tablet Take 1 tablet (200 mg total) by mouth daily. 07/02/14   Archie Patten, MD  aspirin EC 81 MG tablet Take 81 mg by mouth daily.    Historical Provider,  MD  atorvastatin (LIPITOR) 40 MG tablet TAKE 1 TABLET BY MOUTH DAILY 04/29/14   Renee A Kuneff, DO  divalproex (DEPAKOTE ER) 500 MG 24 hr tablet Take 1 tablet (500 mg total) by mouth daily. 07/02/14   Archie Patten, MD  doxycycline (VIBRA-TABS) 100 MG tablet Take 1 tablet (100 mg total) by mouth every 12 (twelve) hours. 07/16/14 07/19/14  Olam Idler, MD  levETIRAcetam (KEPPRA) 500 MG tablet Take 1 tablet (500 mg total) by mouth 2 (two) times daily. 07/02/14   Archie Patten, MD  Multiple Vitamin (MULTIVITAMIN WITH MINERALS) TABS tablet Take 1 tablet by mouth daily. 12/29/12   Kandis Nab, MD  NEXIUM 40 MG capsule  TAKE ONE CAPSULE BY MOUTH EVERY MORNING WITH BREAKFAST 03/07/14   Renee A Kuneff, DO  ondansetron (ZOFRAN ODT) 4 MG disintegrating tablet 4mg  ODT q4 hours prn nausea/vomit Patient not taking: Reported on 03/29/2014 10/09/13   Elnora Morrison, MD  polyethylene glycol powder (GLYCOLAX/MIRALAX) powder Take 17 g by mouth daily. 06/25/14   Renee A Kuneff, DO  potassium chloride SA (K-DUR,KLOR-CON) 20 MEQ tablet Take 1 tablet (20 mEq total) by mouth daily. 07/02/14   Archie Patten, MD  risperiDONE (RISPERDAL) 1 MG tablet Take 1 tablet (1 mg total) by mouth 2 (two) times daily. 07/16/14   Olam Idler, MD      VITAL SIGNS:  There were no vitals taken for this visit.  PHYSICAL EXAMINATION:  GENERAL:  64 y.o.-year-old patient lying in the bed with no acute distress.  EYES: Pupils equal, round, reactive to light and accommodation. No scleral icterus. Extraocular muscles intact.  HEENT: Head atraumatic, normocephalic. Oropharynx and nasopharynx clear.  NECK:  Supple, no jugular venous distention. No thyroid enlargement, no tenderness.  LUNGS: Decreased breath sounds bilaterally, no wheezing, rales,rhonchi or crepitation. No use of accessory muscles of respiration. Patient coughs with deep breath CARDIOVASCULAR: S1, S2 normal. 2/6 systolic ejection murmur, no rubs, or gallops.  ABDOMEN:  Soft, nontender, nondistended. Bowel sounds present. No organomegaly or mass.  EXTREMITIES: Positive for edema, no cyanosis, or clubbing.  NEUROLOGIC: Cranial nerves II through XII are intact. Muscle strength 5/5 in all extremities. Sensation intact. Gait not checked.  PSYCHIATRIC: The patient is alert and oriented x 3.  SKIN: Right arm erythema around where blisters had popped.  LABORATORY PANEL:   Chemistries  No results for input(s): NA, K, CL, CO2, GLUCOSE, BUN, CREATININE, CALCIUM, MG, AST, ALT, ALKPHOS, BILITOT in the last 168 hours.  Invalid input(s): GFRCGP  IMPRESSION AND PLAN:   1. Possible recent pneumonia. On  Azithromycin,continue it 2. Dermatomyositis;possible  Rheumatology consult as an out pt,off methotrexate/prednisone due to side effects. 3. Blistering on the right arm: No signs of infection at this time. 4. History of atrial fibrillation; on aspirin for anticoagulation on amiodarone for rate control 5. Hyperlipidemia unspecified will stop Lipitor with elevation of CPK. 6. History of seizure on Keppra. 7. Gaseous after reflux disease without esophagitis continue Nexium. 8. Depression with psychotic features appreciate psychiatry follow-up.   All the records are reviewed and case discussed with Consulting provider. Management plans discussed with the patient, family and he is in agreement.  CODE STATUS: Full  TOTAL TIME TAKING CARE OF THIS PATIENT:20 min minutes.    Epifanio Lesches M.D on 6/1/15at 5:55 PM  Between 7am to 6pm - Pager - [phone]  After 6pm go to [email or web address] - password [email or web address] [place] [facility]  Office  [phone]  CC: Primary care Physician: Howard Pouch, DO

## 2014-07-26 ENCOUNTER — Emergency Department (HOSPITAL_COMMUNITY)
Admission: EM | Admit: 2014-07-26 | Discharge: 2014-07-26 | Disposition: A | Discharge status: Home / Self Care | Payer: Commercial Managed Care - HMO | Attending: Emergency Medicine | Admitting: Emergency Medicine

## 2014-07-26 ENCOUNTER — Encounter (HOSPITAL_COMMUNITY): Payer: Self-pay | Admitting: Emergency Medicine

## 2014-07-26 ENCOUNTER — Emergency Department (HOSPITAL_COMMUNITY): Payer: Commercial Managed Care - HMO

## 2014-07-26 ENCOUNTER — Telehealth: Payer: Self-pay | Admitting: Family Medicine

## 2014-07-26 ENCOUNTER — Other Ambulatory Visit: Payer: Self-pay | Admitting: Psychiatry

## 2014-07-26 DIAGNOSIS — Z8701 Personal history of pneumonia (recurrent): Secondary | ICD-10-CM | POA: Insufficient documentation

## 2014-07-26 DIAGNOSIS — M549 Dorsalgia, unspecified: Secondary | ICD-10-CM

## 2014-07-26 DIAGNOSIS — Z880 Allergy status to penicillin: Secondary | ICD-10-CM | POA: Diagnosis not present

## 2014-07-26 DIAGNOSIS — R32 Unspecified urinary incontinence: Secondary | ICD-10-CM | POA: Diagnosis not present

## 2014-07-26 DIAGNOSIS — G8929 Other chronic pain: Secondary | ICD-10-CM

## 2014-07-26 DIAGNOSIS — M545 Low back pain: Secondary | ICD-10-CM | POA: Insufficient documentation

## 2014-07-26 DIAGNOSIS — E119 Type 2 diabetes mellitus without complications: Secondary | ICD-10-CM | POA: Insufficient documentation

## 2014-07-26 DIAGNOSIS — Z859 Personal history of malignant neoplasm, unspecified: Secondary | ICD-10-CM | POA: Insufficient documentation

## 2014-07-26 DIAGNOSIS — Z7982 Long term (current) use of aspirin: Secondary | ICD-10-CM | POA: Insufficient documentation

## 2014-07-26 DIAGNOSIS — F319 Bipolar disorder, unspecified: Secondary | ICD-10-CM | POA: Insufficient documentation

## 2014-07-26 DIAGNOSIS — I10 Essential (primary) hypertension: Secondary | ICD-10-CM | POA: Insufficient documentation

## 2014-07-26 DIAGNOSIS — Z9889 Other specified postprocedural states: Secondary | ICD-10-CM | POA: Diagnosis not present

## 2014-07-26 DIAGNOSIS — G40909 Epilepsy, unspecified, not intractable, without status epilepticus: Secondary | ICD-10-CM | POA: Diagnosis not present

## 2014-07-26 DIAGNOSIS — I252 Old myocardial infarction: Secondary | ICD-10-CM | POA: Diagnosis not present

## 2014-07-26 DIAGNOSIS — J449 Chronic obstructive pulmonary disease, unspecified: Secondary | ICD-10-CM | POA: Diagnosis not present

## 2014-07-26 DIAGNOSIS — Z87442 Personal history of urinary calculi: Secondary | ICD-10-CM | POA: Insufficient documentation

## 2014-07-26 DIAGNOSIS — Z720 Tobacco use: Secondary | ICD-10-CM | POA: Insufficient documentation

## 2014-07-26 DIAGNOSIS — E785 Hyperlipidemia, unspecified: Secondary | ICD-10-CM | POA: Diagnosis not present

## 2014-07-26 DIAGNOSIS — R21 Rash and other nonspecific skin eruption: Secondary | ICD-10-CM | POA: Diagnosis not present

## 2014-07-26 DIAGNOSIS — Z862 Personal history of diseases of the blood and blood-forming organs and certain disorders involving the immune mechanism: Secondary | ICD-10-CM | POA: Diagnosis not present

## 2014-07-26 DIAGNOSIS — F419 Anxiety disorder, unspecified: Secondary | ICD-10-CM | POA: Diagnosis not present

## 2014-07-26 DIAGNOSIS — E669 Obesity, unspecified: Secondary | ICD-10-CM | POA: Diagnosis not present

## 2014-07-26 DIAGNOSIS — Z79899 Other long term (current) drug therapy: Secondary | ICD-10-CM | POA: Insufficient documentation

## 2014-07-26 DIAGNOSIS — K2970 Gastritis, unspecified, without bleeding: Secondary | ICD-10-CM | POA: Insufficient documentation

## 2014-07-26 DIAGNOSIS — K219 Gastro-esophageal reflux disease without esophagitis: Secondary | ICD-10-CM | POA: Insufficient documentation

## 2014-07-26 DIAGNOSIS — R350 Frequency of micturition: Secondary | ICD-10-CM | POA: Diagnosis present

## 2014-07-26 DIAGNOSIS — L03314 Cellulitis of groin: Secondary | ICD-10-CM

## 2014-07-26 DIAGNOSIS — Z86010 Personal history of colonic polyps: Secondary | ICD-10-CM | POA: Insufficient documentation

## 2014-07-26 DIAGNOSIS — M546 Pain in thoracic spine: Secondary | ICD-10-CM | POA: Insufficient documentation

## 2014-07-26 DIAGNOSIS — Z7951 Long term (current) use of inhaled steroids: Secondary | ICD-10-CM | POA: Diagnosis not present

## 2014-07-26 DIAGNOSIS — Z87448 Personal history of other diseases of urinary system: Secondary | ICD-10-CM | POA: Diagnosis not present

## 2014-07-26 DIAGNOSIS — K222 Esophageal obstruction: Secondary | ICD-10-CM | POA: Insufficient documentation

## 2014-07-26 LAB — URINALYSIS, ROUTINE W REFLEX MICROSCOPIC
Glucose, UA: NEGATIVE mg/dL
Hgb urine dipstick: NEGATIVE
Ketones, ur: 15 mg/dL — AB
Leukocytes, UA: NEGATIVE
Nitrite: NEGATIVE
PROTEIN: NEGATIVE mg/dL
Specific Gravity, Urine: 1.02 (ref 1.005–1.030)
Urobilinogen, UA: 1 mg/dL (ref 0.0–1.0)
pH: 6 (ref 5.0–8.0)

## 2014-07-26 LAB — CBC WITH DIFFERENTIAL/PLATELET
Basophils Absolute: 0 K/uL (ref 0.0–0.1)
Basophils Relative: 0 % (ref 0–1)
Eosinophils Absolute: 0.1 K/uL (ref 0.0–0.7)
Eosinophils Relative: 1 % (ref 0–5)
HCT: 39.4 % (ref 39.0–52.0)
Hemoglobin: 12.7 g/dL — ABNORMAL LOW (ref 13.0–17.0)
Lymphocytes Relative: 22 % (ref 12–46)
Lymphs Abs: 1.9 K/uL (ref 0.7–4.0)
MCH: 29 pg (ref 26.0–34.0)
MCHC: 32.2 g/dL (ref 30.0–36.0)
MCV: 90 fL (ref 78.0–100.0)
Monocytes Absolute: 0.9 K/uL (ref 0.1–1.0)
Monocytes Relative: 10 % (ref 3–12)
Neutro Abs: 5.8 K/uL (ref 1.7–7.7)
Neutrophils Relative %: 67 % (ref 43–77)
Platelets: 442 K/uL — ABNORMAL HIGH (ref 150–400)
RBC: 4.38 MIL/uL (ref 4.22–5.81)
RDW: 15.4 % (ref 11.5–15.5)
WBC: 8.7 K/uL (ref 4.0–10.5)

## 2014-07-26 LAB — COMPREHENSIVE METABOLIC PANEL WITH GFR
ALT: 24 U/L (ref 17–63)
AST: 43 U/L — ABNORMAL HIGH (ref 15–41)
Albumin: 3 g/dL — ABNORMAL LOW (ref 3.5–5.0)
Alkaline Phosphatase: 70 U/L (ref 38–126)
Anion gap: 10 (ref 5–15)
BUN: 8 mg/dL (ref 6–20)
CO2: 25 mmol/L (ref 22–32)
Calcium: 8.6 mg/dL — ABNORMAL LOW (ref 8.9–10.3)
Chloride: 103 mmol/L (ref 101–111)
Creatinine, Ser: 1.07 mg/dL (ref 0.61–1.24)
GFR calc Af Amer: >60 mL/min
GFR calc non Af Amer: >60 mL/min
Glucose, Bld: 90 mg/dL (ref 65–99)
Potassium: 5 mmol/L (ref 3.5–5.1)
Sodium: 138 mmol/L (ref 135–145)
Total Bilirubin: 2.2 mg/dL — ABNORMAL HIGH (ref 0.3–1.2)
Total Protein: 6.5 g/dL (ref 6.5–8.1)

## 2014-07-26 LAB — VALPROIC ACID LEVEL: Valproic Acid Lvl: <10 ug/mL — ABNORMAL LOW (ref 50.0–100.0)

## 2014-07-26 MED ORDER — DOXYCYCLINE HYCLATE 100 MG PO TABS
100.0000 mg | ORAL_TABLET | Freq: Once | ORAL | Status: AC
  Administered 2014-07-26: 100 mg via ORAL
  Filled 2014-07-26: qty 1

## 2014-07-26 MED ORDER — MORPHINE SULFATE 4 MG/ML IJ SOLN
4.0000 mg | Freq: Once | INTRAMUSCULAR | Status: AC
  Administered 2014-07-26: 4 mg via INTRAVENOUS
  Filled 2014-07-26: qty 1

## 2014-07-26 MED ORDER — LORAZEPAM 2 MG/ML IJ SOLN
1.0000 mg | Freq: Once | INTRAMUSCULAR | Status: DC
  Filled 2014-07-26: qty 1

## 2014-07-26 MED ORDER — LORAZEPAM 2 MG/ML IJ SOLN
2.0000 mg | Freq: Once | INTRAMUSCULAR | Status: AC
  Administered 2014-07-26: 2 mg via INTRAVENOUS

## 2014-07-26 MED ORDER — DOXYCYCLINE HYCLATE 100 MG PO CAPS: 100.0000 mg | ORAL_CAPSULE | Freq: Two times a day (BID) | ORAL | Status: DC

## 2014-07-26 MED ORDER — MUPIROCIN 2 % EX OINT: 1.0000 "application " | TOPICAL_OINTMENT | Freq: Two times a day (BID) | CUTANEOUS | Status: DC

## 2014-07-26 NOTE — Telephone Encounter (Signed)
Patient called reporting blistering on his bottom due to bladder and bowel incontinence. He stated that he was in severe pain.  He requested treatment. I informed him that I thought it would be in his best interest to be evaluated. He is in agreement and will have someone take him to the ED or call for an ambulance.

## 2014-07-26 NOTE — Discharge Instructions (Signed)
Take doxycycline twice daily for a week.   Apply bactroban to your rash.   Follow up with your doctor to get your prescriptions.   Return to ER if you have worse rash, unable to walk.

## 2014-07-26 NOTE — ED Notes (Signed)
Pt not in room.  Will attempt to obtain urine sample when pt returns.

## 2014-07-26 NOTE — ED Notes (Signed)
Pt not in room will obtain vitals when pt returns.

## 2014-07-26 NOTE — ED Notes (Signed)
Pt given ativan in MRI. 

## 2014-07-26 NOTE — ED Notes (Signed)
Pt states he was told to come here by his doctor due to loss of bowel and bladder control. Pt states he cant make it to the bathroom and hes wet so many pants. Pt also c/o rash around penis. Pt is AAOX4, in NAD.
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
## 2014-07-26 NOTE — Care Management (Signed)
ED CM  Consulted by Dr. Yao for possible home health recommendation. Reviewed patient's record, patient does not meet criteria for HH patient does not have a skilled need at this time. Met with patient at the bedside, patient verbalizes he is intersected in ALF placement. Patient lives alone and does not have any family. CM explained that patient does not have payor source for ALF he would have to apply for medicaid. Medicaid application was given with the contact information for DSS. Patient verbalized understanding and appreciation for the information. Update Dr. Yao and Christina RN. No further ED Cm needs identified 

## 2014-07-29 ENCOUNTER — Emergency Department (HOSPITAL_COMMUNITY)
Admission: EM | Admit: 2014-07-29 | Discharge: 2014-07-29 | Disposition: A | Discharge status: Home / Self Care | Payer: Commercial Managed Care - HMO | Attending: Emergency Medicine | Admitting: Emergency Medicine

## 2014-07-29 ENCOUNTER — Encounter (HOSPITAL_COMMUNITY): Payer: Self-pay | Admitting: Emergency Medicine

## 2014-07-29 DIAGNOSIS — Z87442 Personal history of urinary calculi: Secondary | ICD-10-CM | POA: Diagnosis not present

## 2014-07-29 DIAGNOSIS — G40909 Epilepsy, unspecified, not intractable, without status epilepticus: Secondary | ICD-10-CM | POA: Insufficient documentation

## 2014-07-29 DIAGNOSIS — F419 Anxiety disorder, unspecified: Secondary | ICD-10-CM | POA: Insufficient documentation

## 2014-07-29 DIAGNOSIS — M069 Rheumatoid arthritis, unspecified: Secondary | ICD-10-CM | POA: Diagnosis not present

## 2014-07-29 DIAGNOSIS — E780 Pure hypercholesterolemia: Secondary | ICD-10-CM | POA: Diagnosis not present

## 2014-07-29 DIAGNOSIS — Z862 Personal history of diseases of the blood and blood-forming organs and certain disorders involving the immune mechanism: Secondary | ICD-10-CM | POA: Diagnosis not present

## 2014-07-29 DIAGNOSIS — Z86010 Personal history of colonic polyps: Secondary | ICD-10-CM | POA: Insufficient documentation

## 2014-07-29 DIAGNOSIS — K219 Gastro-esophageal reflux disease without esophagitis: Secondary | ICD-10-CM | POA: Insufficient documentation

## 2014-07-29 DIAGNOSIS — R1031 Right lower quadrant pain: Secondary | ICD-10-CM | POA: Diagnosis not present

## 2014-07-29 DIAGNOSIS — I10 Essential (primary) hypertension: Secondary | ICD-10-CM | POA: Diagnosis not present

## 2014-07-29 DIAGNOSIS — Z87448 Personal history of other diseases of urinary system: Secondary | ICD-10-CM | POA: Insufficient documentation

## 2014-07-29 DIAGNOSIS — Z792 Long term (current) use of antibiotics: Secondary | ICD-10-CM | POA: Diagnosis not present

## 2014-07-29 DIAGNOSIS — Z7982 Long term (current) use of aspirin: Secondary | ICD-10-CM | POA: Diagnosis not present

## 2014-07-29 DIAGNOSIS — I252 Old myocardial infarction: Secondary | ICD-10-CM | POA: Insufficient documentation

## 2014-07-29 DIAGNOSIS — I480 Paroxysmal atrial fibrillation: Secondary | ICD-10-CM | POA: Diagnosis not present

## 2014-07-29 DIAGNOSIS — F319 Bipolar disorder, unspecified: Secondary | ICD-10-CM | POA: Insufficient documentation

## 2014-07-29 DIAGNOSIS — Z880 Allergy status to penicillin: Secondary | ICD-10-CM | POA: Diagnosis not present

## 2014-07-29 DIAGNOSIS — E119 Type 2 diabetes mellitus without complications: Secondary | ICD-10-CM | POA: Insufficient documentation

## 2014-07-29 DIAGNOSIS — R110 Nausea: Secondary | ICD-10-CM

## 2014-07-29 DIAGNOSIS — J449 Chronic obstructive pulmonary disease, unspecified: Secondary | ICD-10-CM | POA: Insufficient documentation

## 2014-07-29 DIAGNOSIS — Z9889 Other specified postprocedural states: Secondary | ICD-10-CM | POA: Insufficient documentation

## 2014-07-29 DIAGNOSIS — Z720 Tobacco use: Secondary | ICD-10-CM | POA: Diagnosis not present

## 2014-07-29 DIAGNOSIS — Z79899 Other long term (current) drug therapy: Secondary | ICD-10-CM | POA: Diagnosis not present

## 2014-07-29 DIAGNOSIS — Z87828 Personal history of other (healed) physical injury and trauma: Secondary | ICD-10-CM | POA: Diagnosis not present

## 2014-07-29 DIAGNOSIS — E669 Obesity, unspecified: Secondary | ICD-10-CM | POA: Diagnosis not present

## 2014-07-29 DIAGNOSIS — Z859 Personal history of malignant neoplasm, unspecified: Secondary | ICD-10-CM | POA: Insufficient documentation

## 2014-07-29 LAB — I-STAT TROPONIN, ED: Troponin i, poc: 0.01 ng/mL (ref 0.00–0.08)

## 2014-07-29 LAB — COMPREHENSIVE METABOLIC PANEL
ALK PHOS: 78 U/L (ref 38–126)
ALT: 14 U/L — AB (ref 17–63)
AST: 15 U/L (ref 15–41)
Albumin: 3.1 g/dL — ABNORMAL LOW (ref 3.5–5.0)
Anion gap: 8 (ref 5–15)
BUN: 6 mg/dL (ref 6–20)
CALCIUM: 9 mg/dL (ref 8.9–10.3)
CO2: 26 mmol/L (ref 22–32)
Chloride: 107 mmol/L (ref 101–111)
Creatinine, Ser: 1.11 mg/dL (ref 0.61–1.24)
GFR calc Af Amer: >60 mL/min (ref >60)
GLUCOSE: 115 mg/dL — AB (ref 65–99)
Potassium: 3.4 mmol/L — ABNORMAL LOW (ref 3.5–5.1)
Sodium: 141 mmol/L (ref 135–145)
Total Bilirubin: 0.9 mg/dL (ref 0.3–1.2)
Total Protein: 7.1 g/dL (ref 6.5–8.1)

## 2014-07-29 LAB — CBC WITH DIFFERENTIAL/PLATELET
Basophils Absolute: 0 10*3/uL (ref 0.0–0.1)
Basophils Relative: 0 % (ref 0–1)
EOS ABS: 0.1 10*3/uL (ref 0.0–0.7)
Eosinophils Relative: 2 % (ref 0–5)
HCT: 42.3 % (ref 39.0–52.0)
HEMOGLOBIN: 13.6 g/dL (ref 13.0–17.0)
Lymphocytes Relative: 18 % (ref 12–46)
Lymphs Abs: 1.4 10*3/uL (ref 0.7–4.0)
MCH: 28.9 pg (ref 26.0–34.0)
MCHC: 32.2 g/dL (ref 30.0–36.0)
MCV: 89.8 fL (ref 78.0–100.0)
MONOS PCT: 8 % (ref 3–12)
Monocytes Absolute: 0.6 10*3/uL (ref 0.1–1.0)
NEUTROS PCT: 72 % (ref 43–77)
Neutro Abs: 5.4 10*3/uL (ref 1.7–7.7)
Platelets: 395 10*3/uL (ref 150–400)
RBC: 4.71 MIL/uL (ref 4.22–5.81)
RDW: 15.3 % (ref 11.5–15.5)
WBC: 7.5 10*3/uL (ref 4.0–10.5)

## 2014-07-29 LAB — LIPASE, BLOOD: Lipase: 18 U/L — ABNORMAL LOW (ref 22–51)

## 2014-07-29 MED ORDER — ONDANSETRON 4 MG PO TBDP
8.0000 mg | ORAL_TABLET | Freq: Once | ORAL | Status: AC
  Administered 2014-07-29: 8 mg via ORAL
  Filled 2014-07-29: qty 2

## 2014-07-29 MED ORDER — SODIUM CHLORIDE 0.9 % IV BOLUS (SEPSIS)
500.0000 mL | Freq: Once | INTRAVENOUS | Status: AC
  Administered 2014-07-29: 500 mL via INTRAVENOUS
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
## 2014-07-29 NOTE — ED Notes (Signed)
Per PTAR, the patient reports having nausea and vomiting today. Reports blood was mixed in vomit also. Hx of afib.
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
## 2014-07-29 NOTE — Discharge Instructions (Signed)
Follow up with your PCP in the next week.     Nausea and Vomiting Nausea is a sick feeling that often comes before throwing up (vomiting). Vomiting is a reflex where stomach contents come out of your mouth. Vomiting can cause severe loss of body fluids (dehydration). Children and elderly adults can become dehydrated quickly, especially if they also have diarrhea. Nausea and vomiting are symptoms of a condition or disease. It is important to find the cause of your symptoms. CAUSES   Direct irritation of the stomach lining. This irritation can result from increased acid production (gastroesophageal reflux disease), infection, food poisoning, taking certain medicines (such as nonsteroidal anti-inflammatory drugs), alcohol use, or tobacco use.  Signals from the brain.These signals could be caused by a headache, heat exposure, an inner ear disturbance, increased pressure in the brain from injury, infection, a tumor, or a concussion, pain, emotional stimulus, or metabolic problems.  An obstruction in the gastrointestinal tract (bowel obstruction).  Illnesses such as diabetes, hepatitis, gallbladder problems, appendicitis, kidney problems, cancer, sepsis, atypical symptoms of a heart attack, or eating disorders.  Medical treatments such as chemotherapy and radiation.  Receiving medicine that makes you sleep (general anesthetic) during surgery. DIAGNOSIS Your caregiver may ask for tests to be done if the problems do not improve after a few days. Tests may also be done if symptoms are severe or if the reason for the nausea and vomiting is not clear. Tests may include:  Urine tests.  Blood tests.  Stool tests.  Cultures (to look for evidence of infection).  X-rays or other imaging studies. Test results can help your caregiver make decisions about treatment or the need for additional tests. TREATMENT You need to stay well hydrated. Drink frequently but in small amounts.You may wish to drink  water, sports drinks, clear broth, or eat frozen ice pops or gelatin dessert to help stay hydrated.When you eat, eating slowly may help prevent nausea.There are also some antinausea medicines that may help prevent nausea. HOME CARE INSTRUCTIONS   Take all medicine as directed by your caregiver.  If you do not have an appetite, do not force yourself to eat. However, you must continue to drink fluids.  If you have an appetite, eat a normal diet unless your caregiver tells you differently.  Eat a variety of complex carbohydrates (rice, wheat, potatoes, bread), lean meats, yogurt, fruits, and vegetables.  Avoid high-fat foods because they are more difficult to digest.  Drink enough water and fluids to keep your urine clear or pale yellow.  If you are dehydrated, ask your caregiver for specific rehydration instructions. Signs of dehydration may include:  Severe thirst.  Dry lips and mouth.  Dizziness.  Dark urine.  Decreasing urine frequency and amount.  Confusion.  Rapid breathing or pulse. SEEK IMMEDIATE MEDICAL CARE IF:   You have blood or brown flecks (like coffee grounds) in your vomit.  You have black or bloody stools.  You have a severe headache or stiff neck.  You are confused.  You have severe abdominal pain.  You have chest pain or trouble breathing.  You do not urinate at least once every 8 hours.  You develop cold or clammy skin.  You continue to vomit for longer than 24 to 48 hours.  You have a fever. MAKE SURE YOU:   Understand these instructions.  Will watch your condition.  Will get help right away if you are not doing well or get worse. Document Released: 02/01/2005 Document Revised: 04/26/2011 Document  Reviewed: 07/01/2010 Exit[employer] Patient Information 2015 [place], [place]. This information is not intended to replace advice given to you by your health care provider. Make sure you discuss any questions you have with your health care  provider.

## 2014-07-30 ENCOUNTER — Ambulatory Visit (INDEPENDENT_AMBULATORY_CARE_PROVIDER_SITE_OTHER): Payer: Commercial Managed Care - HMO | Admitting: Family Medicine

## 2014-07-30 ENCOUNTER — Other Ambulatory Visit: Payer: Self-pay | Admitting: Family Medicine

## 2014-07-30 ENCOUNTER — Encounter: Payer: Self-pay | Admitting: Family Medicine

## 2014-07-30 ENCOUNTER — Ambulatory Visit: Payer: Self-pay | Admitting: Family Medicine

## 2014-07-30 VITALS — BP 139/96 | HR 81 | Temp 98.7°F | Ht 65.0 in | Wt 200.9 lb

## 2014-07-30 DIAGNOSIS — Z659 Problem related to unspecified psychosocial circumstances: Secondary | ICD-10-CM | POA: Insufficient documentation

## 2014-07-30 DIAGNOSIS — F22 Delusional disorders: Secondary | ICD-10-CM

## 2014-07-30 DIAGNOSIS — Z609 Problem related to social environment, unspecified: Secondary | ICD-10-CM

## 2014-07-30 MED ORDER — RISPERIDONE 1 MG PO TABS: 1.0000 mg | ORAL_TABLET | Freq: Two times a day (BID) | ORAL | Status: DC

## 2014-07-30 MED ORDER — LEVETIRACETAM 500 MG PO TABS: 500.0000 mg | ORAL_TABLET | Freq: Two times a day (BID) | ORAL | Status: DC

## 2014-07-30 MED ORDER — OXYBUTYNIN CHLORIDE 5 MG PO TABS: 5.0000 mg | ORAL_TABLET | Freq: Three times a day (TID) | ORAL | Status: DC

## 2014-07-30 MED ORDER — ATORVASTATIN CALCIUM 40 MG PO TABS: 40.0000 mg | ORAL_TABLET | Freq: Every day | ORAL | Status: DC

## 2014-07-30 MED ORDER — AMIODARONE HCL 200 MG PO TABS: 200.0000 mg | ORAL_TABLET | Freq: Every day | ORAL | Status: DC

## 2014-07-30 MED ORDER — POTASSIUM CHLORIDE CRYS ER 20 MEQ PO TBCR
20.0000 meq | EXTENDED_RELEASE_TABLET | Freq: Every day | ORAL | Status: DC
Start: 2014-07-30 — End: 2014-07-30

## 2014-07-30 MED ORDER — DIVALPROEX SODIUM ER 500 MG PO TB24: 500.0000 mg | ORAL_TABLET | Freq: Every day | ORAL | Status: DC

## 2014-07-30 NOTE — Assessment & Plan Note (Signed)
Patrick Holt is a 64 y.o. for hospital follow-up after being admitted to the psychiatric/behavioral health for paranoid delusions. - Poor social situation/paranoid delusions: Patient continues to have paranoid delusions of people breaking into his home, or knocking on his door every night to bother him. This is likely paranoid delusions, but unknown if it's actual facts. Patient admittedly wants to be in a skilled nursing facility, knowing that he has difficult with medications and delusions, as well as taking care of himself. Refill on all medications today called into his pharmacy. Patient strongly encouraged to follow-up with [facility], this information was given him today and he was encouraged to call tomorrow. Full medication list was given to him to take to [facility] with him. Will order home health nurse and social worker to assist with medications and social situation, and hopefully assist with helping him with placement.

## 2014-07-30 NOTE — Assessment & Plan Note (Signed)
Patrick Holt is a 64 y.o. for hospital follow-up after being admitted to the psychiatric/behavioral health for paranoid delusions. - Poor social situation/paranoid delusions: Patient continues to have paranoid delusions of people breaking into his home, or knocking on his door every night to bother him. This is likely paranoid delusions, but unknown if it's actual facts. Patient admittedly wants to be in a skilled nursing facility, knowing that he has difficult with medications and delusions, as well as taking care of himself. Refill on all medications today called into his pharmacy. Patient strongly encouraged to follow-up with [facility], this information was given him today and he was encouraged to call tomorrow. Full medication list was given to him to take to [facility] with him. Will order home health nurse and social worker to assist with medications and social situation, and hopefully assist with helping him with placement.
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
## 2014-07-30 NOTE — Patient Instructions (Signed)
Please call [facility], I have written this on your paper for contact information call them tomorrow. I will place a referral for home health to come into your home they will be contacting you in the next few days to set this up. This includes a Education officer, museum to help you with your paper work.

## 2014-07-31 ENCOUNTER — Emergency Department (HOSPITAL_COMMUNITY): Payer: Commercial Managed Care - HMO

## 2014-07-31 ENCOUNTER — Emergency Department (HOSPITAL_COMMUNITY)
Admission: EM | Admit: 2014-07-31 | Discharge: 2014-08-01 | Disposition: A | Discharge status: Home / Self Care | Payer: Commercial Managed Care - HMO | Attending: Emergency Medicine | Admitting: Emergency Medicine

## 2014-07-31 ENCOUNTER — Encounter (HOSPITAL_COMMUNITY): Payer: Self-pay | Admitting: Emergency Medicine

## 2014-07-31 ENCOUNTER — Telehealth: Payer: Self-pay | Admitting: Clinical

## 2014-07-31 DIAGNOSIS — K219 Gastro-esophageal reflux disease without esophagitis: Secondary | ICD-10-CM | POA: Diagnosis not present

## 2014-07-31 DIAGNOSIS — Z87442 Personal history of urinary calculi: Secondary | ICD-10-CM | POA: Diagnosis not present

## 2014-07-31 DIAGNOSIS — I252 Old myocardial infarction: Secondary | ICD-10-CM | POA: Insufficient documentation

## 2014-07-31 DIAGNOSIS — Z86010 Personal history of colonic polyps: Secondary | ICD-10-CM | POA: Diagnosis not present

## 2014-07-31 DIAGNOSIS — Z7982 Long term (current) use of aspirin: Secondary | ICD-10-CM | POA: Insufficient documentation

## 2014-07-31 DIAGNOSIS — G40909 Epilepsy, unspecified, not intractable, without status epilepticus: Secondary | ICD-10-CM | POA: Insufficient documentation

## 2014-07-31 DIAGNOSIS — M069 Rheumatoid arthritis, unspecified: Secondary | ICD-10-CM | POA: Insufficient documentation

## 2014-07-31 DIAGNOSIS — J441 Chronic obstructive pulmonary disease with (acute) exacerbation: Secondary | ICD-10-CM

## 2014-07-31 DIAGNOSIS — R0602 Shortness of breath: Secondary | ICD-10-CM | POA: Diagnosis present

## 2014-07-31 DIAGNOSIS — Z859 Personal history of malignant neoplasm, unspecified: Secondary | ICD-10-CM | POA: Diagnosis not present

## 2014-07-31 DIAGNOSIS — M797 Fibromyalgia: Secondary | ICD-10-CM | POA: Diagnosis not present

## 2014-07-31 DIAGNOSIS — Z862 Personal history of diseases of the blood and blood-forming organs and certain disorders involving the immune mechanism: Secondary | ICD-10-CM | POA: Diagnosis not present

## 2014-07-31 DIAGNOSIS — I10 Essential (primary) hypertension: Secondary | ICD-10-CM | POA: Insufficient documentation

## 2014-07-31 DIAGNOSIS — Z79899 Other long term (current) drug therapy: Secondary | ICD-10-CM | POA: Diagnosis not present

## 2014-07-31 DIAGNOSIS — J449 Chronic obstructive pulmonary disease, unspecified: Secondary | ICD-10-CM | POA: Insufficient documentation

## 2014-07-31 DIAGNOSIS — E669 Obesity, unspecified: Secondary | ICD-10-CM | POA: Diagnosis not present

## 2014-07-31 DIAGNOSIS — Z880 Allergy status to penicillin: Secondary | ICD-10-CM | POA: Diagnosis not present

## 2014-07-31 DIAGNOSIS — F319 Bipolar disorder, unspecified: Secondary | ICD-10-CM | POA: Diagnosis not present

## 2014-07-31 DIAGNOSIS — E785 Hyperlipidemia, unspecified: Secondary | ICD-10-CM | POA: Diagnosis not present

## 2014-07-31 DIAGNOSIS — R0781 Pleurodynia: Secondary | ICD-10-CM | POA: Diagnosis not present

## 2014-07-31 DIAGNOSIS — E119 Type 2 diabetes mellitus without complications: Secondary | ICD-10-CM | POA: Diagnosis not present

## 2014-07-31 DIAGNOSIS — Z720 Tobacco use: Secondary | ICD-10-CM | POA: Insufficient documentation

## 2014-07-31 DIAGNOSIS — F419 Anxiety disorder, unspecified: Secondary | ICD-10-CM | POA: Diagnosis not present

## 2014-07-31 LAB — CBC WITH DIFFERENTIAL/PLATELET
BASOS ABS: 0 10*3/uL (ref 0.0–0.1)
Basophils Relative: 0 % (ref 0–1)
Eosinophils Absolute: 0.2 10*3/uL (ref 0.0–0.7)
Eosinophils Relative: 2 % (ref 0–5)
HCT: 41 % (ref 39.0–52.0)
Hemoglobin: 13.2 g/dL (ref 13.0–17.0)
LYMPHS PCT: 17 % (ref 12–46)
Lymphs Abs: 1.5 10*3/uL (ref 0.7–4.0)
MCH: 29 pg (ref 26.0–34.0)
MCHC: 32.2 g/dL (ref 30.0–36.0)
MCV: 90.1 fL (ref 78.0–100.0)
MONO ABS: 1.1 10*3/uL — AB (ref 0.1–1.0)
Monocytes Relative: 13 % — ABNORMAL HIGH (ref 3–12)
NEUTROS ABS: 6 10*3/uL (ref 1.7–7.7)
Neutrophils Relative %: 68 % (ref 43–77)
PLATELETS: 349 10*3/uL (ref 150–400)
RBC: 4.55 MIL/uL (ref 4.22–5.81)
RDW: 15.3 % (ref 11.5–15.5)
WBC: 8.8 10*3/uL (ref 4.0–10.5)

## 2014-07-31 LAB — BASIC METABOLIC PANEL
ANION GAP: 6 (ref 5–15)
BUN: 9 mg/dL (ref 6–20)
CO2: 28 mmol/L (ref 22–32)
Calcium: 8.6 mg/dL — ABNORMAL LOW (ref 8.9–10.3)
Chloride: 106 mmol/L (ref 101–111)
Creatinine, Ser: 1.14 mg/dL (ref 0.61–1.24)
GFR calc Af Amer: >60 mL/min (ref >60)
GFR calc non Af Amer: >60 mL/min (ref >60)
Glucose, Bld: 120 mg/dL — ABNORMAL HIGH (ref 65–99)
POTASSIUM: 3.6 mmol/L (ref 3.5–5.1)
SODIUM: 140 mmol/L (ref 135–145)

## 2014-07-31 LAB — I-STAT TROPONIN, ED: Troponin i, poc: 0.01 ng/mL (ref 0.00–0.08)

## 2014-07-31 LAB — BRAIN NATRIURETIC PEPTIDE: B Natriuretic Peptide: 28.4 pg/mL (ref 0.0–100.0)

## 2014-07-31 LAB — D-DIMER, QUANTITATIVE (NOT AT ARMC): D DIMER QUANT: 3.7 ug{FEU}/mL — AB (ref 0.00–0.48)

## 2014-07-31 IMAGING — CR DG LUMBAR SPINE COMPLETE 4+V
5 series · 5 of 5 positions shown · non-contrast
Comparison: 11/12/2011, MRI 02/18/2011

CLINICAL DATA: Fall.  Low back pain.

LUMBAR SPINE - COMPLETE 4+ VIEW

[t l-spine a.p.]
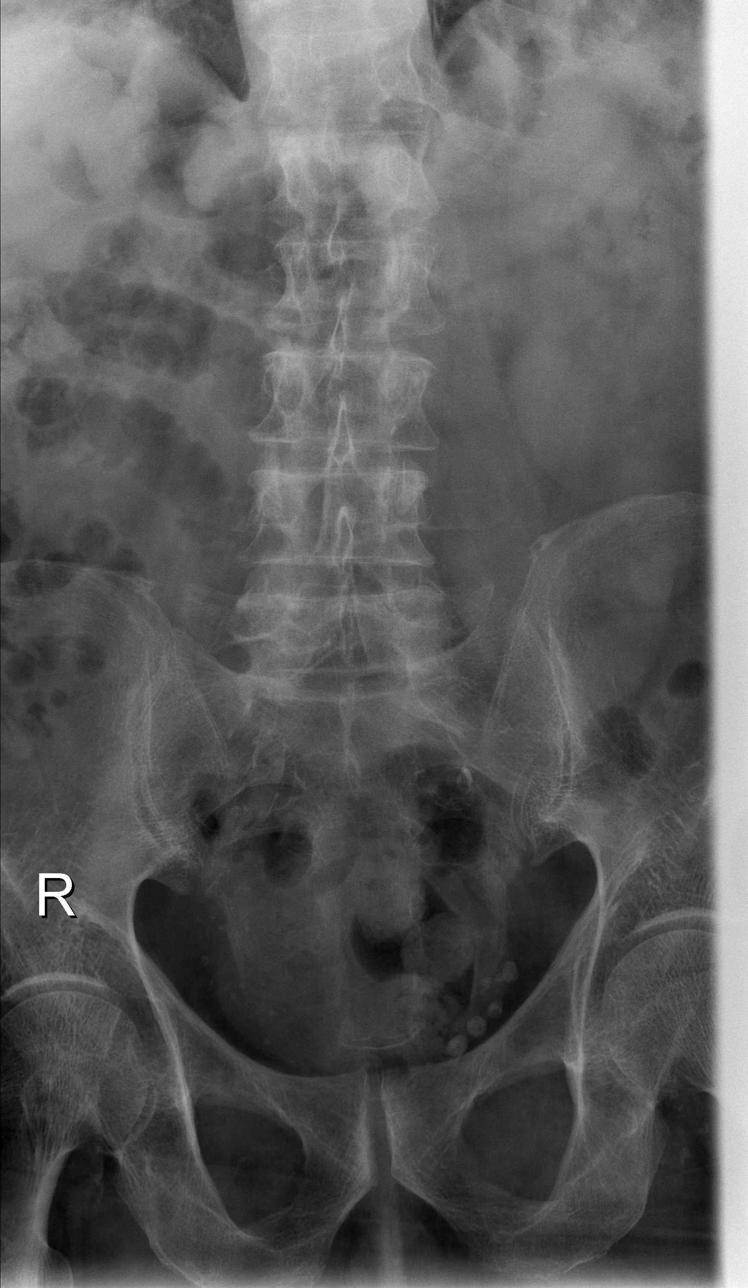

[t l-spine oblique exposure (1 of 2)]
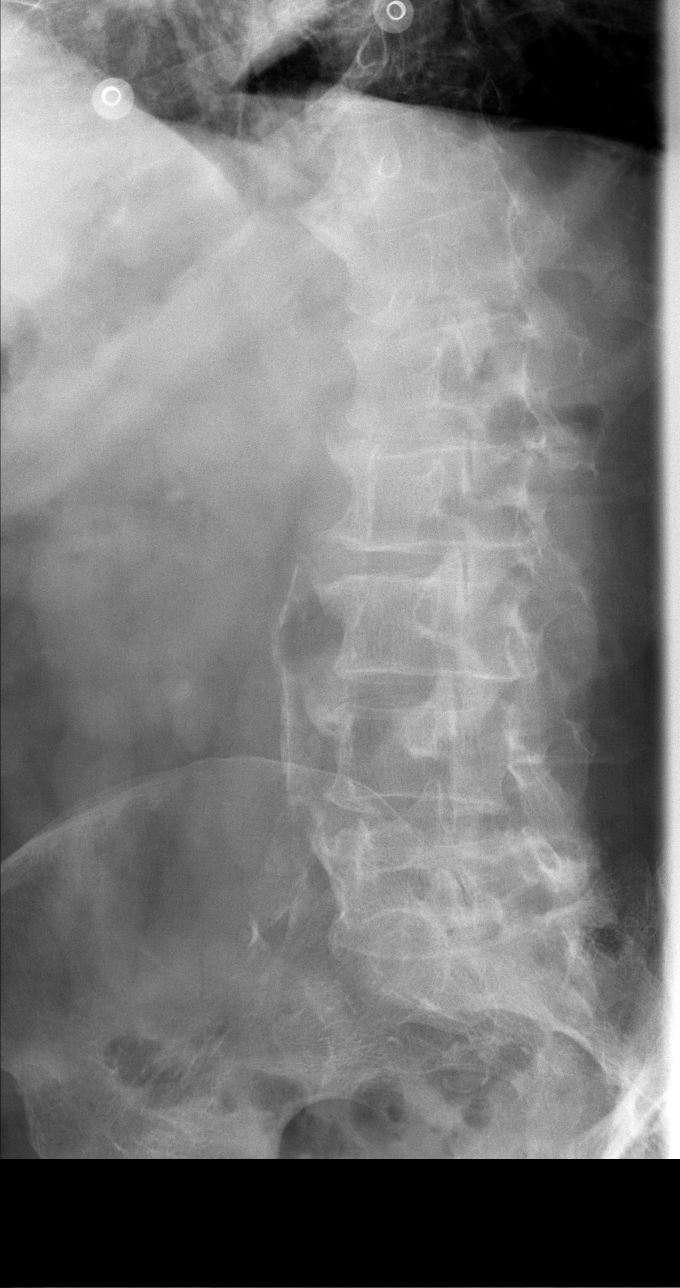

[t l-spine oblique exposure (2 of 2)]
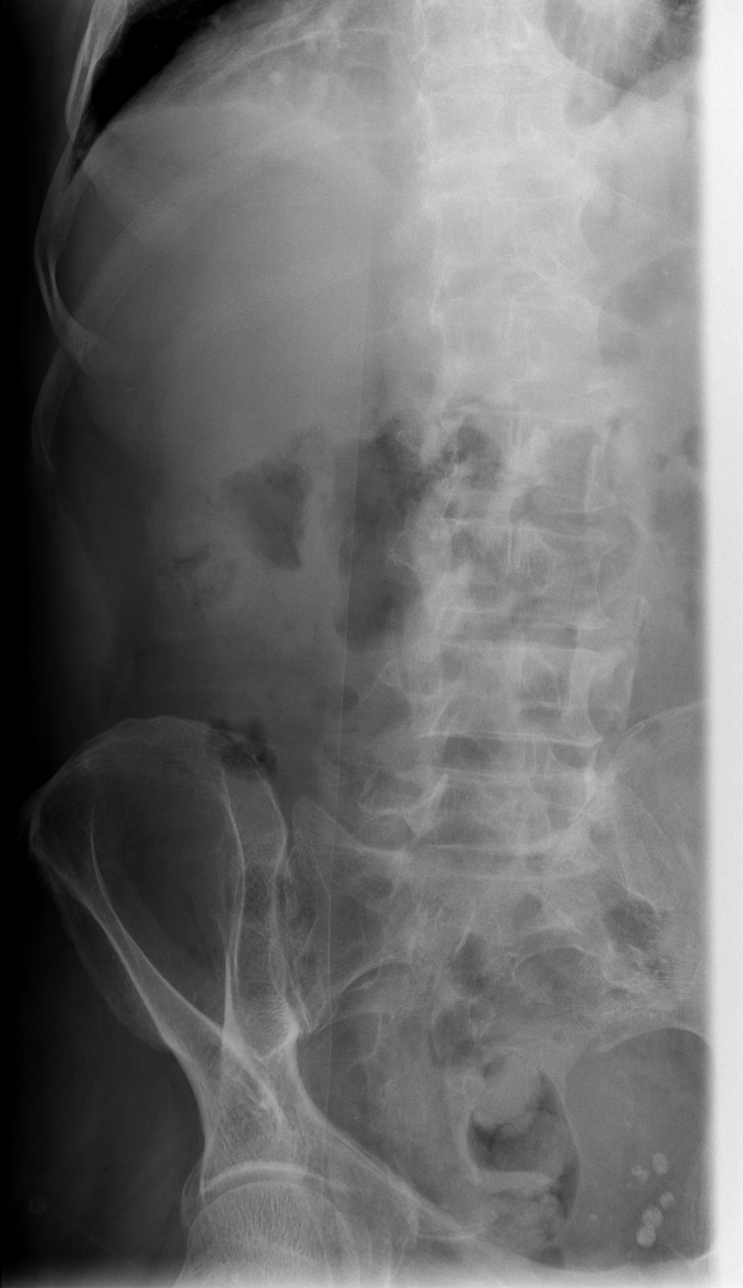

[t l-spine lat]
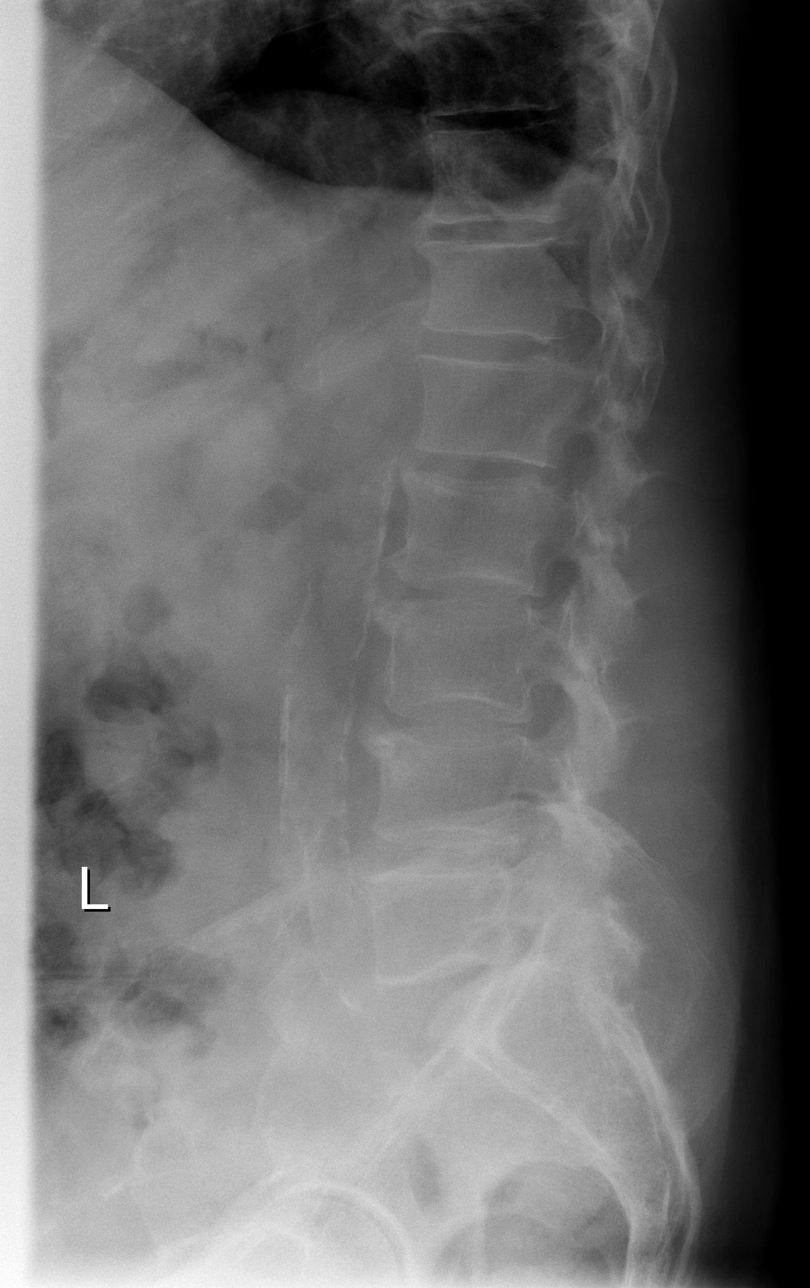

[t l-spine l5-s1 spot]
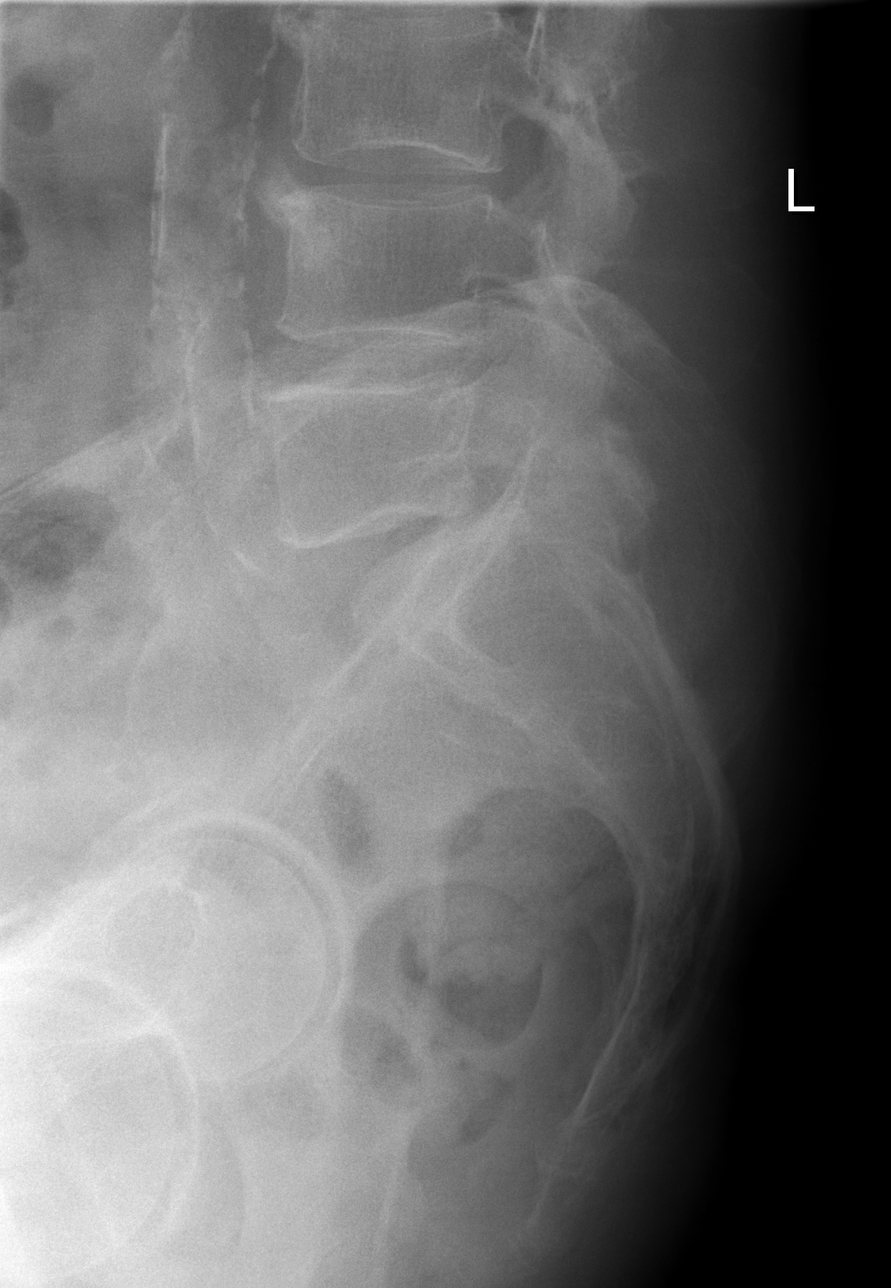

[5 of 5 positions shown; findings below may reference images not displayed]

FINDINGS: Grade 1 slip L5 on S1 is stable from prior studies.
There are bilateral pars defects of L5.  Remaining lumbar alignment
is normal.  No acute fracture.  Mild disc degeneration and anterior
spurring L2-3 and L3-4.
IMPRESSION: Grade 1 slip L5-S1 with bilateral pars defects of L5.

No acute fracture.

## 2014-07-31 IMAGING — CT CT HEAD W/O CM
5 of 9 series · 16 of 47 positions shown, 18 images · non-contrast
Comparison: 11/22/2011.

CT HEAD

CLINICAL DATA: Found down.  Weakness and confusion.

CT HEAD WITHOUT CONTRAST
CT CERVICAL SPINE WITHOUT CONTRAST
TECHNIQUE: Multidetector CT imaging of the head and cervical spine
was performed following the standard protocol without intravenous
contrast.  Multiplanar CT image reconstructions of the cervical
spine were also generated.

[Series 3: head trauma 4.8 h37s · axial · 0.48mm/px · z∈[-73,-15]mm · 2 of 36 slices shown]
[im 12/36  brain]
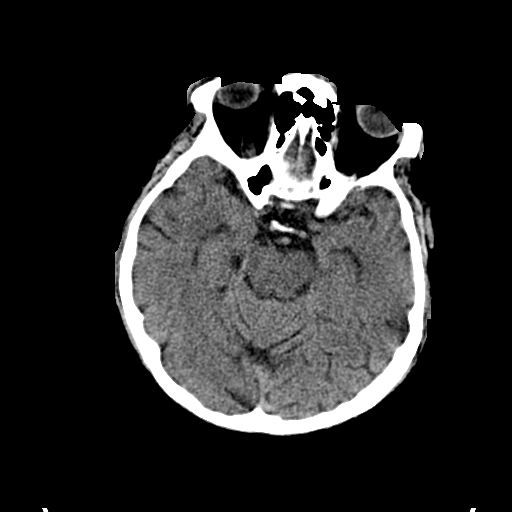
[im 24/36  brain]
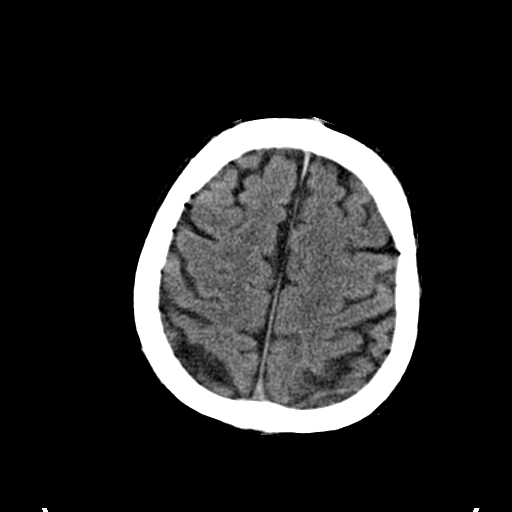

[Series 604: axial c spine · axial · 0.49mm/px · z∈[-340,-218]mm · 5 of 104 slices shown]
[im 18/104  brain]
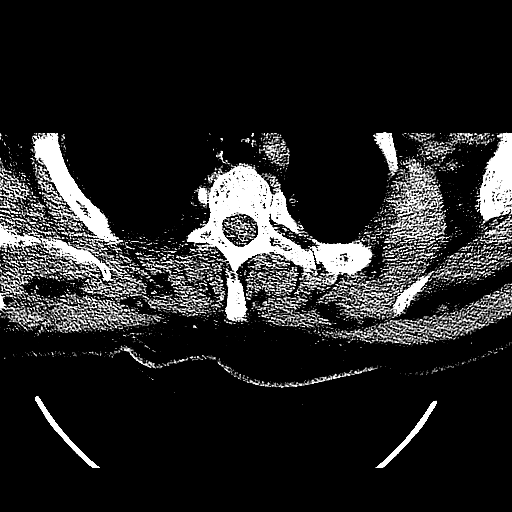
[im 35/104  brain]
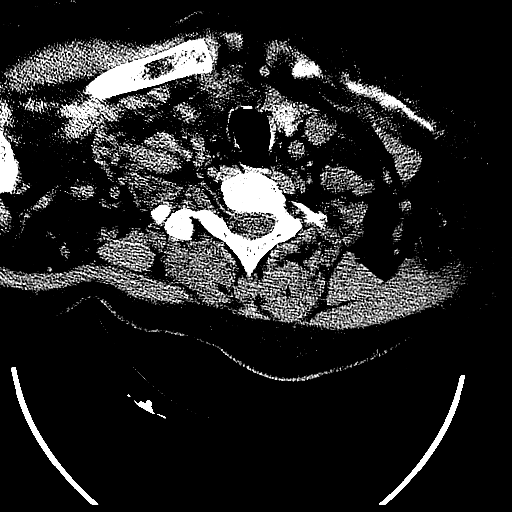
[im 52/104  brain]
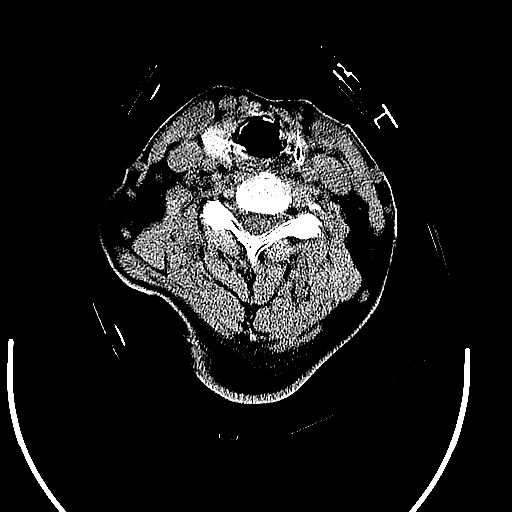
[im 69/104  brain]
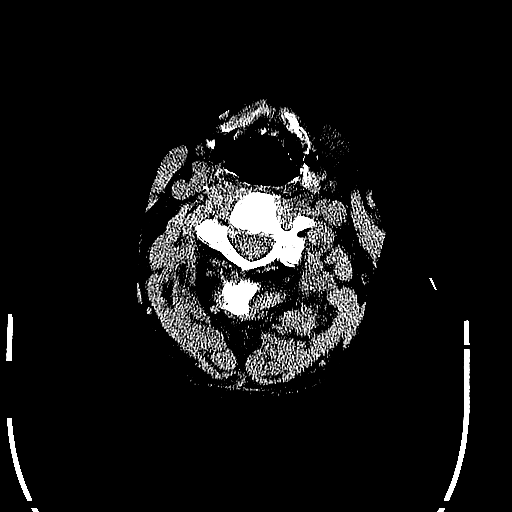
[im 86/104  brain]
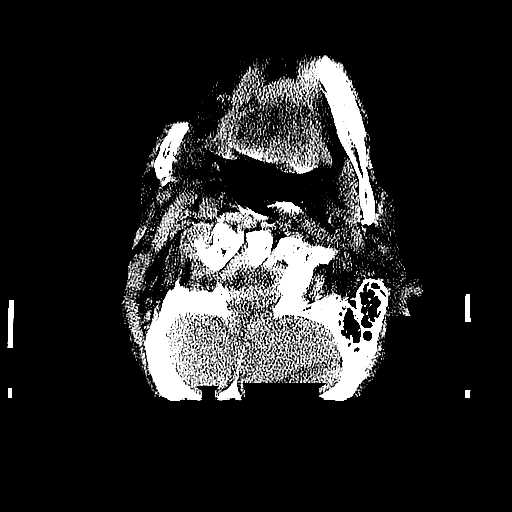

[Series 605: sagittals · sagittal · 0.49mm/px · 1 of 45 slices shown]
[im 23/45  brain]
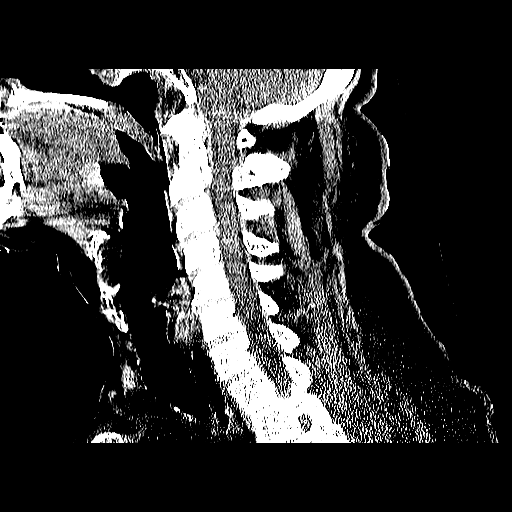

[Series 606: coronals · coronal · 0.49mm/px · 3 of 48 slices shown]
[im 12/48  brain]
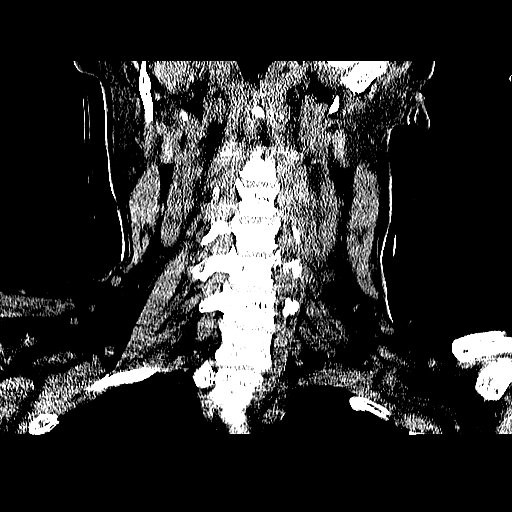
[im 24/48  brain]
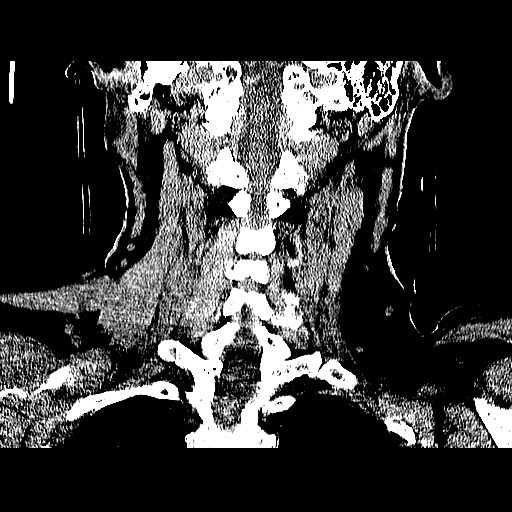
[im 36/48  brain]
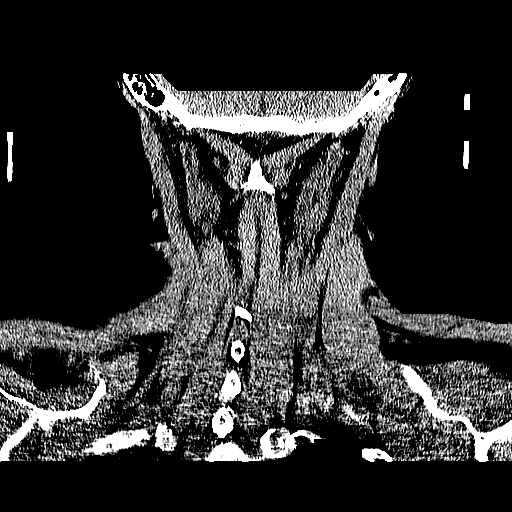

[Series 607: axials · axial · 0.49mm/px · z∈[-340,-212]mm · 5 of 105 slices shown, 7 images]
[im 18/105  brain]
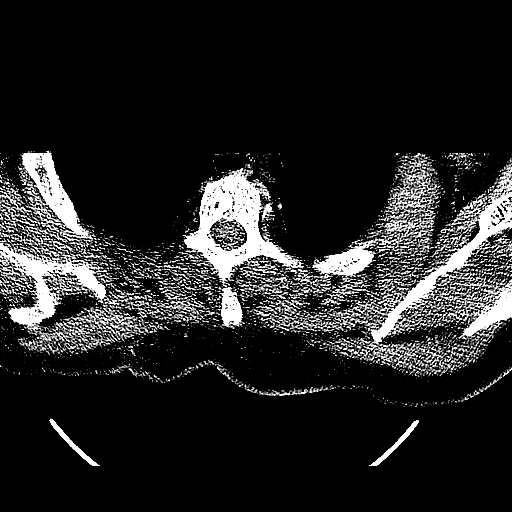
[im 18/105  bone]
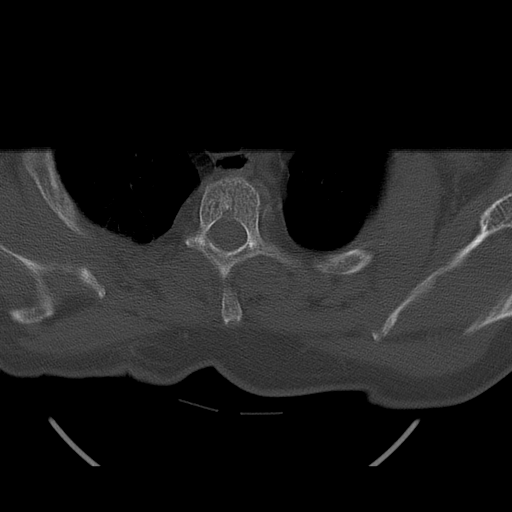
[im 35/105  brain]
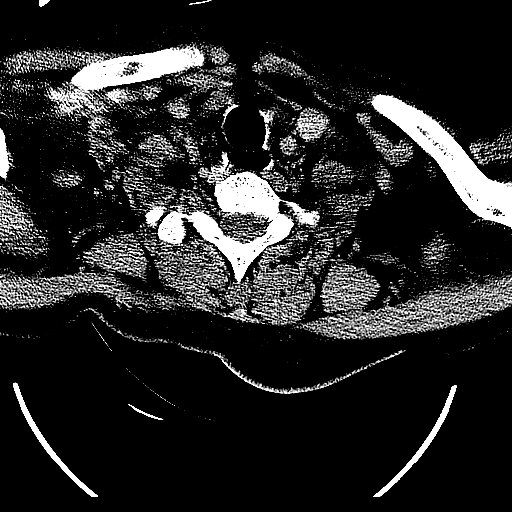
[im 53/105  brain]
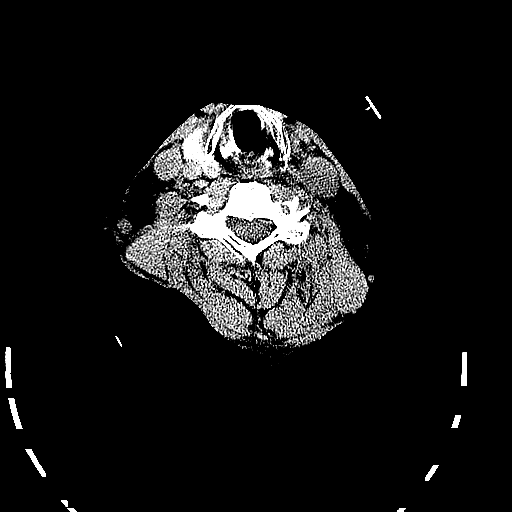
[im 70/105  brain]
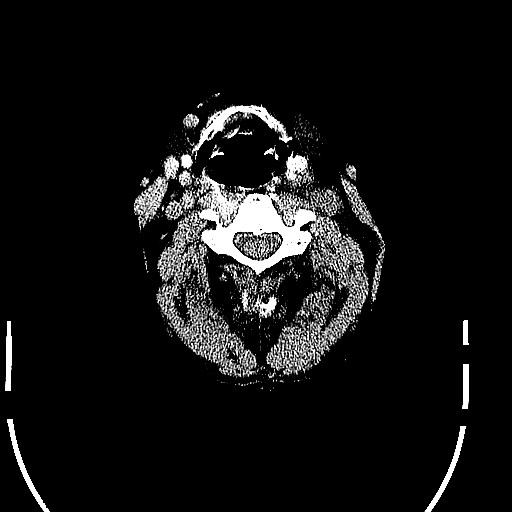
[im 87/105  brain]
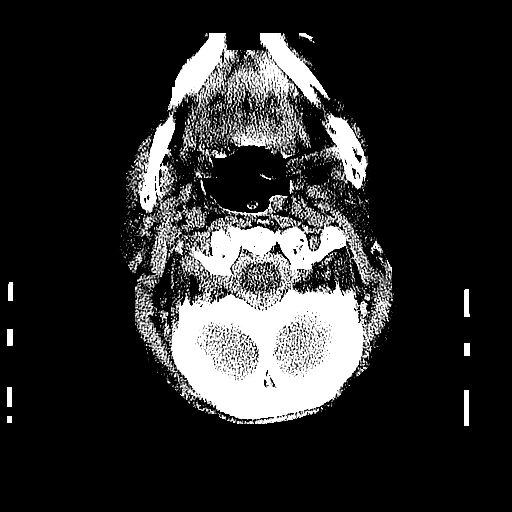
[im 87/105  bone]
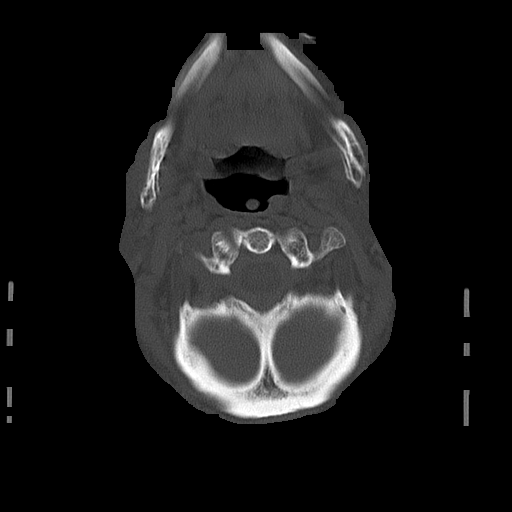

[16 of 47 positions shown; findings below may reference images not displayed]

FINDINGS: The ventricles are in the midline without mass effect or
shift.  There are stable in size and configuration.  There is
advanced periventricular white matter disease for age.  No extra-
axial fluid collections identified.  No CT findings for acute
hemispheric infarction and/or intracranial hemorrhage.  No mass
lesions.  The brainstem and cerebellum grossly normal and stable.

The bony structures are intact.  The paranasal sinuses and mastoid
air cells are clear.
IMPRESSION: 1.  Stable age advanced periventricular white matter disease.
2.  No acute intracranial findings or mass lesions.

CT CERVICAL SPINE
FINDINGS: Examination is degraded by motion artifact but the
overall alignment of the cervical vertebral bodies is normal and no
acute fractures identified.  There is a remote ununited spinous
process fracture of T1.
IMPRESSION: Normal alignment and no acute bony findings.

## 2014-07-31 IMAGING — CR DG PORTABLE PELVIS
1 series · 1 of 1 positions shown · non-contrast
Comparison: None

CLINICAL DATA: Fell.

PORTABLE PELVIS

[AP]
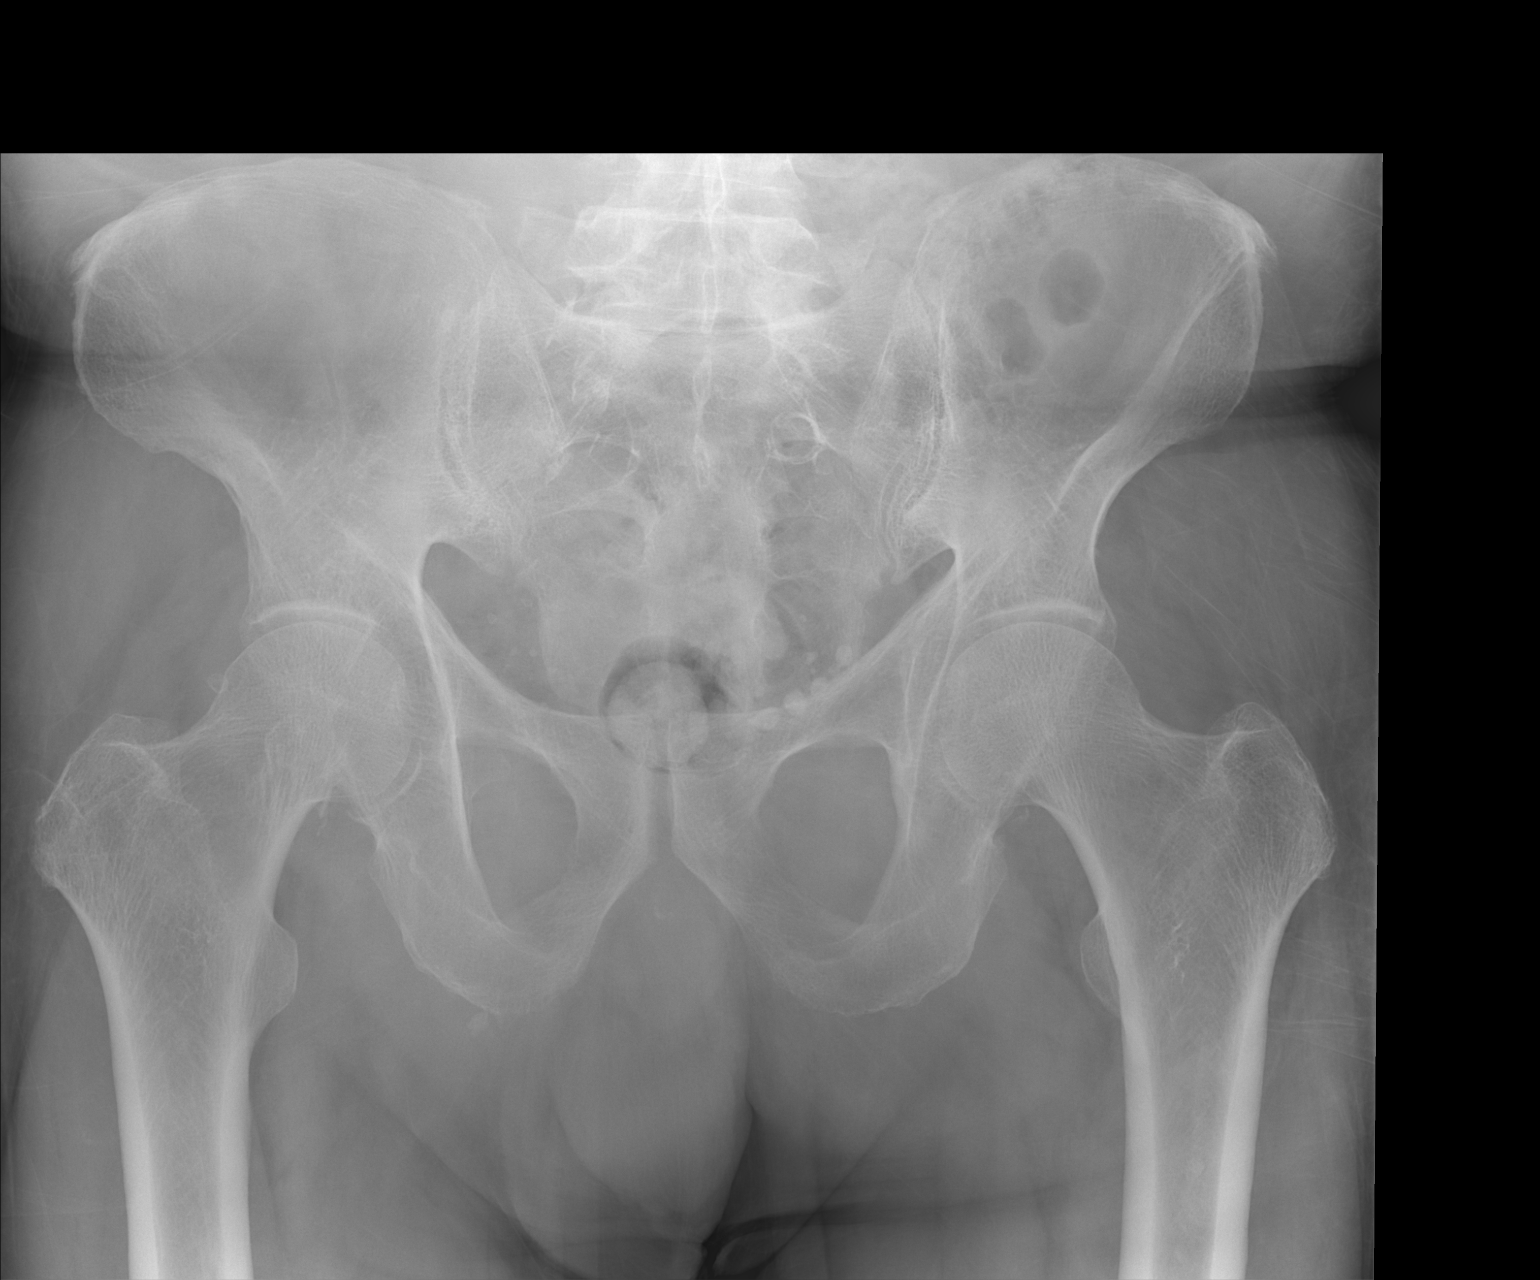

[1 of 1 positions shown; findings below may reference images not displayed]

FINDINGS: The hips are normally located.  No acute hip fracture.
The pubic symphysis and SI joints are intact.  No pelvic fractures.
IMPRESSION: No acute bony findings.

## 2014-07-31 IMAGING — CR DG CHEST 1V PORT
1 series · 1 of 1 positions shown · non-contrast
Comparison: 11/12/2011.

CLINICAL DATA: Found down.  Confused.

PORTABLE CHEST - 1 VIEW

[AP]
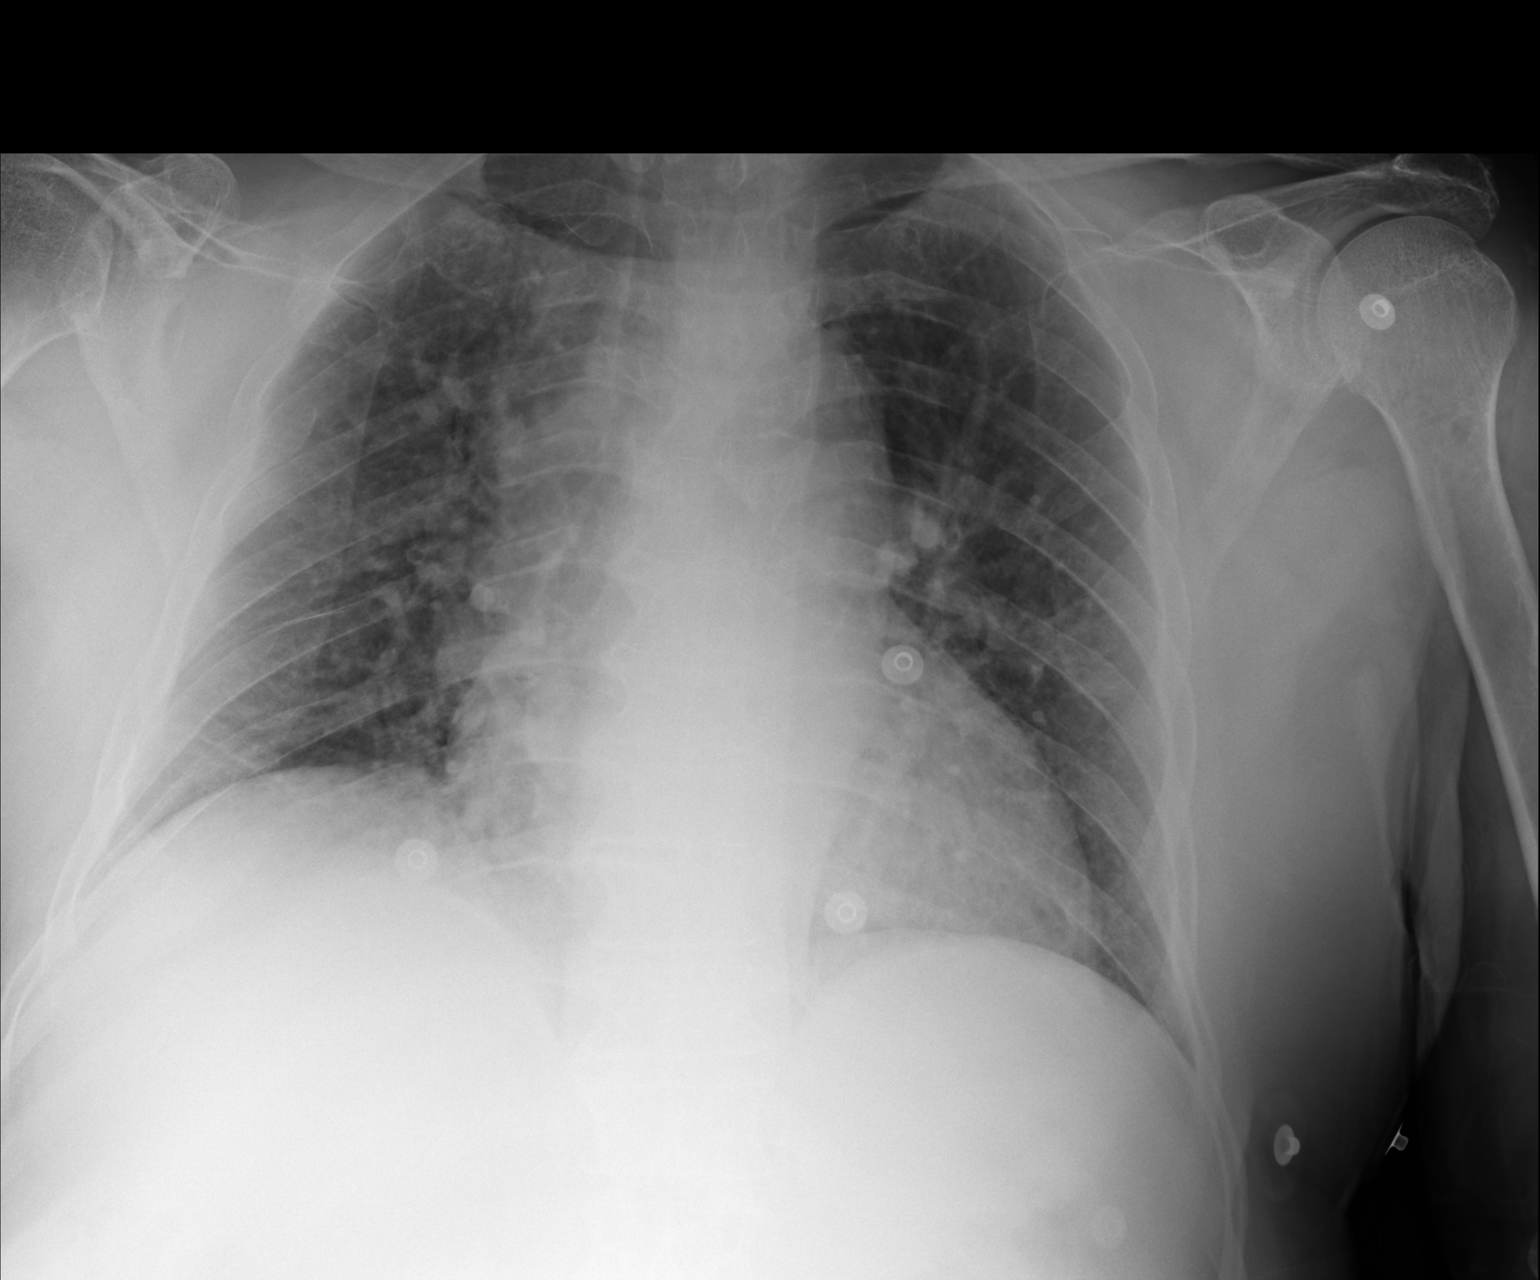

[1 of 1 positions shown; findings below may reference images not displayed]

FINDINGS: The cardiac silhouette, mediastinal and hilar contours
are stable given the supine position of the patient.  The lungs are
grossly clear.  The bony thorax is intact.  Remote healed rib
fractures are noted.
IMPRESSION: No acute cardiopulmonary findings and intact bony thorax.

## 2014-07-31 MED ORDER — FENTANYL CITRATE (PF) 100 MCG/2ML IJ SOLN
50.0000 ug | Freq: Once | INTRAMUSCULAR | Status: DC
  Filled 2014-07-31: qty 2

## 2014-07-31 MED ORDER — ASPIRIN 325 MG PO TABS
325.0000 mg | ORAL_TABLET | Freq: Once | ORAL | Status: AC
  Administered 2014-07-31: 325 mg via ORAL
  Filled 2014-07-31: qty 1

## 2014-07-31 MED ORDER — IOHEXOL 350 MG/ML SOLN
100.0000 mL | Freq: Once | INTRAVENOUS | Status: AC | PRN
  Administered 2014-07-31: 100 mL via INTRAVENOUS

## 2014-07-31 MED ORDER — MORPHINE SULFATE 4 MG/ML IJ SOLN
4.0000 mg | Freq: Once | INTRAMUSCULAR | Status: AC
  Administered 2014-07-31: 4 mg via INTRAVENOUS
  Filled 2014-07-31: qty 1

## 2014-07-31 NOTE — Telephone Encounter (Signed)
CSW received a referral to assist pt who wants to leave his apartment and move into an ALF. CSW reviewed the ED notes and observed that pt was directed to complete a Medicaid application on 2/99. Pt was recently discharged from [facility] and could benefit from extra support at home to assist him with the Medicaid process. Pt informed CSW that he does not feel safe leaving his home and would prefer that someone come to his home asap to assist him.   CSW has sent a referral to [facility] for [facility] nursing (to assist with med management) and a social worker to assist with placement. CSW also contacted Loni Muse, RNCM with [facility] to notify her of pt recent discharge, needs and requests. Loni Muse, RN stated that she will notify RNCM/CSW assigned to assist with pt needs.   Pt aware and appreciative of referrals that have been made. CSW will remain available as needed to assist with placement once Medicaid application is completed.  Hunt Oris, MSW, [place]

## 2014-07-31 NOTE — ED Notes (Signed)
Pt hooked back up to bedside cardiac monitor, or & bp cuff

## 2014-07-31 NOTE — ED Notes (Signed)
Pt to ED via [facility] with c/o shortness of breath.  St's he has felt short of breath for past 2 weeks.  Pt was seen here for same on 6/13.

## 2014-07-31 NOTE — ED Provider Notes (Signed)
CSN: [id number]     Arrival date & time 07/31/14  1825 History   First MD Initiated Contact with Patient 07/31/14 1827     Chief Complaint  Patient presents with  . Shortness of Breath     (Consider location/radiation/quality/duration/timing/severity/associated sxs/prior Treatment) Patient is a 64 y.o. male presenting with chest pain. The history is provided by the patient. No language interpreter was used.  Chest Pain Pain location:  Substernal area Pain quality: sharp   Pain radiates to:  Does not radiate Pain radiates to the back: yes   Pain severity:  Moderate Duration:  2 weeks Timing:  Constant Progression:  Unchanged Chronicity:  New Context: at rest   Relieved by:  Nothing Worsened by:  Deep breathing Associated symptoms: anorexia, cough, fever, nausea and vomiting (2 days ago, now resolved for 2 days)   Associated symptoms: no abdominal pain, no anxiety, no back pain, no diaphoresis, no dizziness, no dysphagia, no fatigue, no headache, no lower extremity edema, no numbness, no shortness of breath and no weakness   Risk factors: coronary artery disease, hypertension and smoking   Risk factors: no prior DVT/PE     Past Medical History  Diagnosis Date  . Rheumatoid arthritis(714.0)   . Obesity   . Major depression     with acute psychotic break in 06/2010  . Hypertension   . Hyperlipidemia   . Diverticulosis of colon   . COPD (chronic obstructive pulmonary disease)   . Anxiety   . GERD (gastroesophageal reflux disease)   . Vertigo   . Fibromyalgia   . Dermatomyositis   . Myocardial infarct     mulitple (1999, 2000, 2004)  . Raynaud's disease   . Narcotic dependence   . Peripheral neuropathy   . Internal hemorrhoids   . Ischemic heart disease   . Hiatal hernia   . Gastritis   . Diverticulitis   . Hx of adenomatous colonic polyps   . Nephrolithiasis   . Anemia   . Esophageal stricture   . Esophageal dysmotility   . Dermatomyositis   . Urge incontinence    . Otosclerosis   . Bipolar 1 disorder   . OCD (obsessive compulsive disorder)   . Sarcoidosis   . Paroxysmal a-fib   . Dysrhythmia     "irregular" (11/15/2012)  . Type II diabetes mellitus   . Seizures   . Headache(784.0)     "severe; get them often" (11/15/2012)  . Subarachnoid hemorrhage 01/2012    with subdural  hematoma.   . Atrial fibrillation 01/2012    with RVR  . Conversion disorder 06/2010  . History of narcotic addiction   . Asthma   . Cancer   . Collagen vascular disease   . Renal insufficiency    Past Surgical History  Procedure Laterality Date  . Knee arthroscopy w/ meniscal repair Left 2009  . Lumbar disc surgery    . Squamous papilloma   2010    removed by Dr. Constance Holster ENT, noted on tongue  . Esophagogastroduodenoscopy N/A 09/27/2012    Procedure: ESOPHAGOGASTRODUODENOSCOPY (EGD);  Surgeon: Lafayette Dragon, MD;  Location: [facility];  Service: Endoscopy;  Laterality: N/A;  . Colonoscopy N/A 09/27/2012    Procedure: COLONOSCOPY;  Surgeon: Lafayette Dragon, MD;  Location: [facility];  Service: Endoscopy;  Laterality: N/A;  . Cataract extraction w/ intraocular lens implant Left   . Lymph node dissection Right 1970's    "neck; dr thought I had Hodgkins; turned out to be sarcoidosis" (  11/15/2012)  . Tonsillectomy    . Carpal tunnel release Bilateral   . Cardiac catheterization    . Back surgery     Family History  Problem Relation Age of Onset  . Alcohol abuse Mother   . Depression Mother   . Heart disease Mother   . Diabetes Mother   . Stroke Mother   . Diabetes Other     1/2 brother  . Hepatitis Brother   . Coronary artery disease Father    History  Substance Use Topics  . Smoking status: Current Every Day Smoker -- 30 years    Types: Cigarettes  . Smokeless tobacco: Never Used     Comment: 3 cigs a day  . Alcohol Use: No     Comment: Alcohol stopped in September of 2014    Review of Systems  Constitutional: Positive for fever. Negative for  diaphoresis, activity change, appetite change and fatigue.  HENT: Positive for ear pain and sore throat. Negative for congestion, facial swelling, rhinorrhea and trouble swallowing.   Eyes: Negative for photophobia and pain.  Respiratory: Positive for cough. Negative for chest tightness and shortness of breath.   Cardiovascular: Positive for chest pain. Negative for leg swelling.  Gastrointestinal: Positive for nausea, vomiting (2 days ago, now resolved for 2 days) and anorexia. Negative for abdominal pain, diarrhea and constipation.  Endocrine: Negative for polydipsia and polyuria.  Genitourinary: Negative for dysuria, urgency, decreased urine volume and difficulty urinating.  Musculoskeletal: Negative for back pain and gait problem.  Skin: Negative for color change, rash and wound.  Allergic/Immunologic: Negative for immunocompromised state.  Neurological: Negative for dizziness, facial asymmetry, speech difficulty, weakness, numbness and headaches.  Psychiatric/Behavioral: Negative for confusion, decreased concentration and agitation.      Allergies  Immune globulins; Ciprofloxacin; Flagyl; Lisinopril; Sulfa antibiotics; Betamethasone dipropionate; Bupropion hcl; Clobetasol; Codeine; Escitalopram oxalate; Fluoxetine hcl; Fluticasone-salmeterol; Furosemide; Paroxetine; Penicillins; Tacrolimus; Tetanus toxoid; and Tuberculin purified protein derivative  Home Medications   Prior to Admission medications   Medication Sig Start Date End Date Taking? Authorizing Provider  albuterol-ipratropium (COMBIVENT) 18-103 MCG/ACT inhaler Inhale 1 puff into the lungs daily. 06/20/13  Yes Gerda Diss, DO  amiodarone (PACERONE) 200 MG tablet Take 1 tablet (200 mg total) by mouth daily. 07/30/14  Yes Renee A Kuneff, DO  aspirin EC 81 MG tablet Take 81 mg by mouth daily.   Yes Historical Provider, MD  atorvastatin (LIPITOR) 40 MG tablet Take 1 tablet (40 mg total) by mouth daily. 07/30/14  Yes Renee A  Kuneff, DO  divalproex (DEPAKOTE ER) 500 MG 24 hr tablet Take 1 tablet (500 mg total) by mouth daily. 07/30/14  Yes Renee A Kuneff, DO  doxycycline (VIBRAMYCIN) 100 MG capsule Take 100 mg by mouth 2 (two) times daily. 07/27/14  Yes Historical Provider, MD  levETIRAcetam (KEPPRA) 500 MG tablet TAKE 1 TABLET(500 MG) BY MOUTH TWICE DAILY 07/31/14  Yes Renee A Kuneff, DO  Multiple Vitamin (MULTIVITAMIN WITH MINERALS) TABS tablet Take 1 tablet by mouth daily. 12/29/12  Yes Kandis Nab, MD  NEXIUM 40 MG capsule TAKE ONE CAPSULE BY MOUTH EVERY MORNING WITH BREAKFAST 03/07/14  Yes Renee A Kuneff, DO  oxybutynin (DITROPAN) 5 MG tablet Take 1 tablet (5 mg total) by mouth 3 (three) times daily. 07/30/14  Yes Renee A Kuneff, DO  potassium chloride SA (K-DUR,KLOR-CON) 20 MEQ tablet TAKE 1 TABLET(20 MEQ) BY MOUTH DAILY 07/31/14  Yes Renee A Kuneff, DO  risperiDONE (RISPERDAL) 1 MG tablet TAKE 1  TABLET(1 MG) BY MOUTH TWICE DAILY 07/31/14  Yes Renee A Kuneff, DO  rOPINIRole (REQUIP) 2 MG tablet Takes 1 tablet by mouth three times a day 06/28/14  Yes Historical Provider, MD  polyethylene glycol powder (GLYCOLAX/MIRALAX) powder Take 17 g by mouth daily. Patient not taking: Reported on 07/29/2014 06/25/14   Renee A Kuneff, DO   BP 121/65 mmHg  Pulse 69  Resp 19  SpO2 99% Physical Exam  Constitutional: He is oriented to person, place, and time. He appears well-developed and well-nourished. No distress.  HENT:  Head: Normocephalic and atraumatic.  Mouth/Throat: No oropharyngeal exudate.  Eyes: Pupils are equal, round, and reactive to light.  Neck: Normal range of motion. Neck supple.  Cardiovascular: Normal rate, regular rhythm and normal heart sounds.  Exam reveals no gallop and no friction rub.   No murmur heard. Pulmonary/Chest: Effort normal and breath sounds normal. No respiratory distress. He has no wheezes. He has no rales.    Abdominal: Soft. Bowel sounds are normal. He exhibits no distension and no mass.  There is no tenderness. There is no rebound and no guarding.  Musculoskeletal: Normal range of motion. He exhibits no edema or tenderness.  Neurological: He is alert and oriented to person, place, and time.  Skin: Skin is warm and dry.  Psychiatric: He has a normal mood and affect.    ED Course  Procedures (including critical care time) Labs Review Labs Reviewed  CBC WITH DIFFERENTIAL/PLATELET - Abnormal; Notable for the following:    Monocytes Relative 13 (*)    Monocytes Absolute 1.1 (*)    All other components within normal limits  BASIC METABOLIC PANEL - Abnormal; Notable for the following:    Glucose, Bld 120 (*)    Calcium 8.6 (*)    All other components within normal limits  D-DIMER, QUANTITATIVE (NOT AT [facility]) - Abnormal; Notable for the following:    D-Dimer, Quant 3.70 (*)    All other components within normal limits  BRAIN NATRIURETIC PEPTIDE  I-STAT TROPOININ, ED  Randolm Idol, ED    Imaging Review Dg Chest 2 View  07/31/2014   CLINICAL DATA:  Chronic central chest pain and shortness of breath, worsening today. Initial encounter.  EXAM: CHEST  2 VIEW  COMPARISON:  Chest radiograph from 07/16/2014, and CTA of the chest performed 07/06/2014  FINDINGS: The lungs are well-aerated. A small right pleural effusion is noted. Underlying vascular congestion is seen, with increased interstitial markings, concerning for mild interstitial edema. There is no evidence of pneumothorax.  The heart is normal in size; the mediastinal contour is within normal limits. No acute osseous abnormalities are seen.  IMPRESSION: Small right pleural effusion noted. Underlying vascular congestion, with increased interstitial markings, concerning for mild interstitial edema.   Electronically Signed   By: Garald Balding M.D.   On: 07/31/2014 19:59   Ct Angio Chest Pe W/cm &/or Wo Cm  07/31/2014   CLINICAL DATA:  Cough and shortness of Breath since Monday. Hemoptysis.  EXAM: CT ANGIOGRAPHY CHEST WITH  CONTRAST  TECHNIQUE: Multidetector CT imaging of the chest was performed using the standard protocol during bolus administration of intravenous contrast. Multiplanar CT image reconstructions and MIPs were obtained to evaluate the vascular anatomy.  CONTRAST:  129mL OMNIPAQUE IOHEXOL 350 MG/ML SOLN  COMPARISON:  07/06/2014  FINDINGS: Chest wall: No chest wall mass, supraclavicular or axillary lymphadenopathy. The thyroid gland is grossly normal. The bony thorax is intact.  Mediastinum: The heart is upper limits of normal in  size. No pericardial effusion. Small scattered mediastinal and hilar lymph nodes but no mass or overt adenopathy. The aorta is normal in caliber. No dissection. Stable atherosclerotic calcifications. The esophagus is grossly normal. The pulmonary arterial tree is fairly well opacified. No definite filling defects to suggest pulmonary embolism. Exam is somewhat limited by breathing motion artifact.  Lungs/pleura: There is a moderate-sized right pleural effusion with significant overlying atelectasis. There are underlying emphysematous changes.  Upper abdomen:  No significant findings.  Review of the MIP images confirms the above findings.  IMPRESSION: 1. No CT findings for pulmonary embolism. 2. Stable atherosclerotic calcifications involving the aorta but no aneurysm or dissection. Stable coronary artery calcifications. 3. Moderate size right pleural effusion with overlying atelectasis. 4. No mediastinal or hilar mass or adenopathy.   Electronically Signed   By: Marijo Sanes M.D.   On: 07/31/2014 22:15     EKG Interpretation None     ED ECG REPORT   Date: 08/01/2014  Rate: 81  Rhythm: normal sinus rhythm  QRS Axis: normal  Intervals: normal  ST/T Wave abnormalities: normal  Conduction Disutrbances:none  Narrative Interpretation:   Old EKG Reviewed: changes noted and Prior EKG with a fib, now sinus  I have personally reviewed the EKG tracing and agree with the computerized  printout as noted.   MDM   Final diagnoses:  Pleuritic chest pain    Pt is a 64 y.o. male with Pmhx as above who presents with about 2 weeks of constant CP, SOB. CP sharp, pleuritic.  He also complains of subjective fevers and chills, sore throat and ear pain He has had 2 recent admissions, one here and one to [facility] regional and has been seen in the ED 8 times in the past 6 months for various complaints.  On physical exam, vital signs are stable.  He is in no acute distress, he hass reproducible central chest pain, no calf pain or swelling, lung fields are clear.  He reports he has not been taking his medications.  He has a known history of A. Fib, sarcoidosis known coronary disease and history of 2 MIs.  EKG shows no acute ischemic changes.  Three-hour delta troponin is negative.  Given his 2 weeks of constant chest pain feel it is very unlikely that pain is consistent with ACS, CT angiogram ordered and is also negative for PE.  I feel patient is safe for outpatient follow-up for continued workup for this persistent pain    [name] evaluation in the Emergency Department is complete. It has been determined that no acute conditions requiring further emergency intervention are present at this time. The patient/guardian have been advised of the diagnosis and plan. We have discussed signs and symptoms that warrant return to the ED, such as changes or worsening in symptoms, fever, worsening SOB.       Ernestina Patches, MD 08/01/14 [phone]

## 2014-08-01 LAB — I-STAT TROPONIN, ED: Troponin i, poc: 0 ng/mL (ref 0.00–0.08)

## 2014-08-01 IMAGING — CT CT HEAD W/O CM
1 of 2 series · 13 of 30 positions shown, 17 images · non-contrast
Comparison: 02/03/2012

CLINICAL DATA: Altered mental status, possible seizure

CT HEAD WITHOUT CONTRAST
TECHNIQUE: Contiguous axial images were obtained from the base of
the skull through the vertex without contrast.

[Series 2: head routine 4.8 h37s · axial · 0.43mm/px · z∈[-152,-5]mm · 13 of 36 slices shown, 17 images]
[im 3/36  brain]
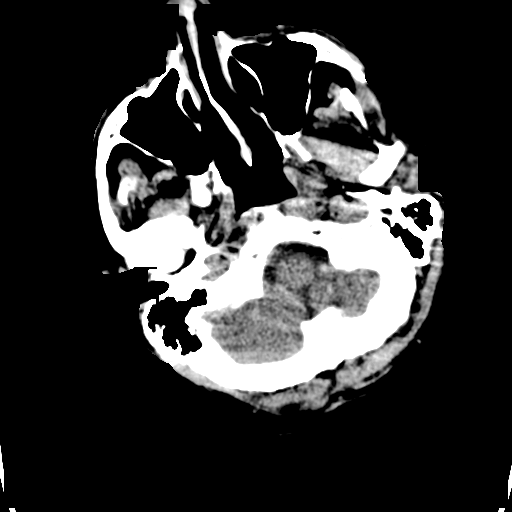
[im 3/36  bone]
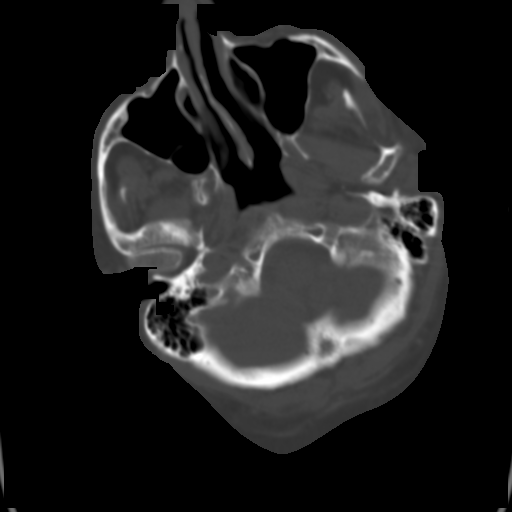
[im 6/36  brain]
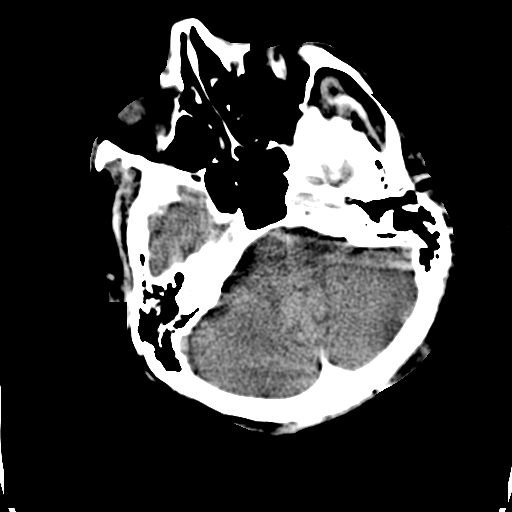
[im 8/36  brain]
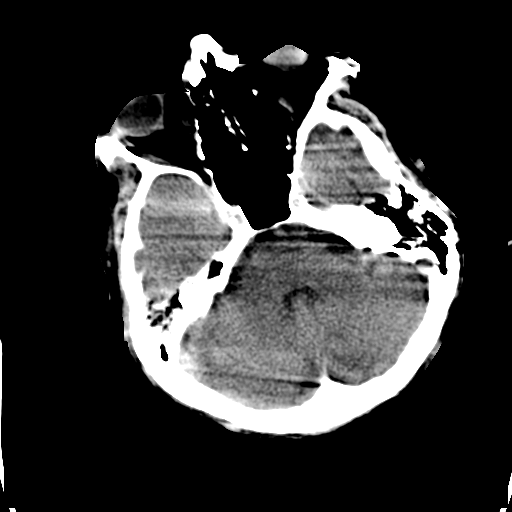
[im 11/36  brain]
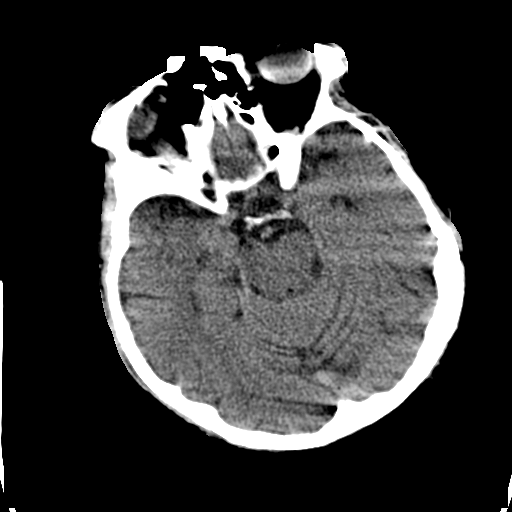
[im 13/36  brain]
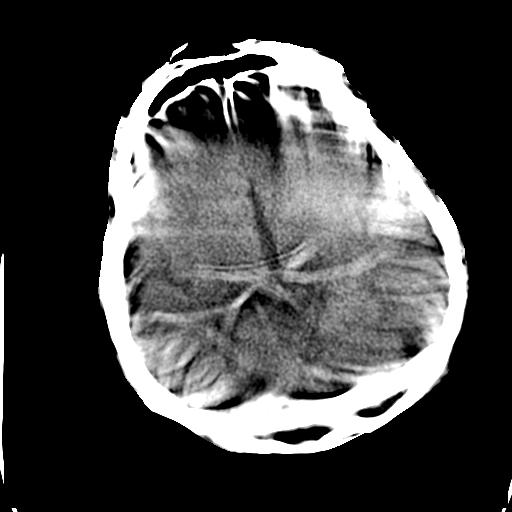
[im 13/36  bone]
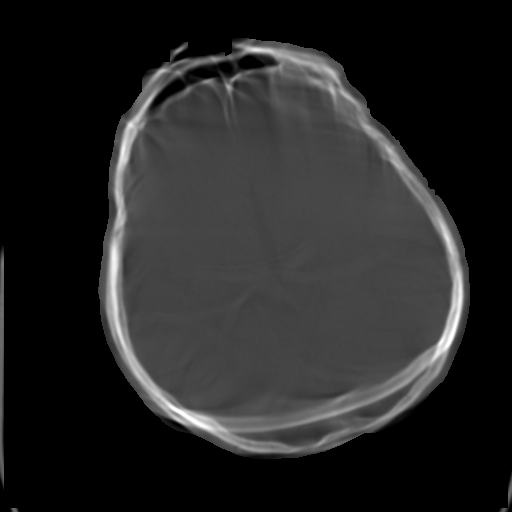
[im 16/36  brain]
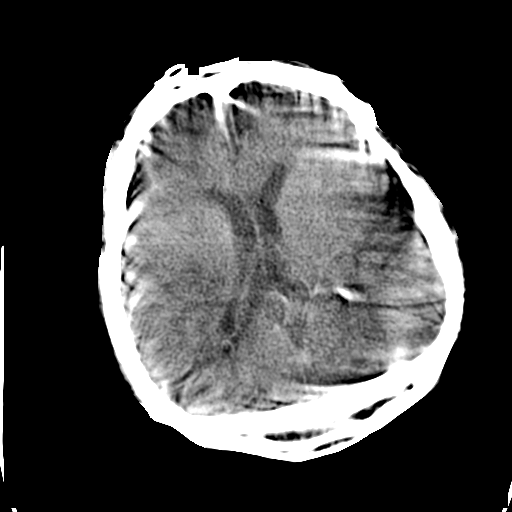
[im 18/36  brain]
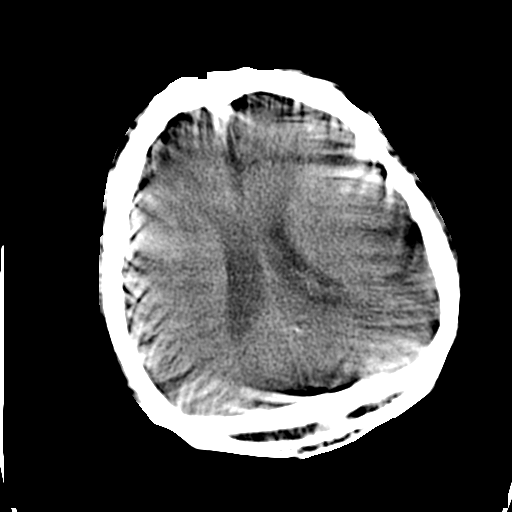
[im 21/36  brain]
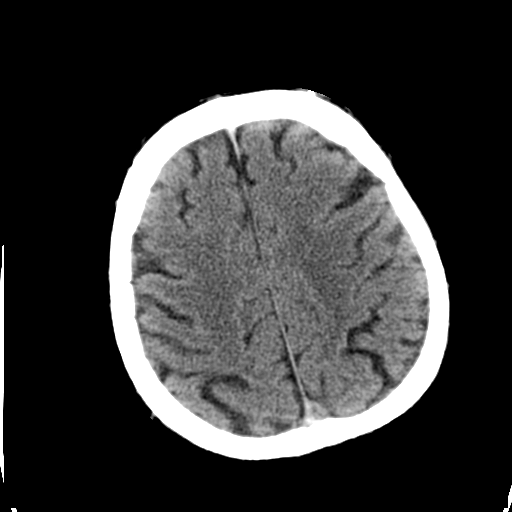
[im 23/36  brain]
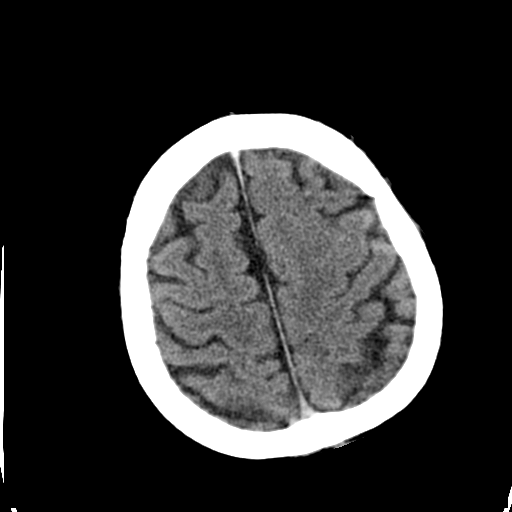
[im 23/36  bone]
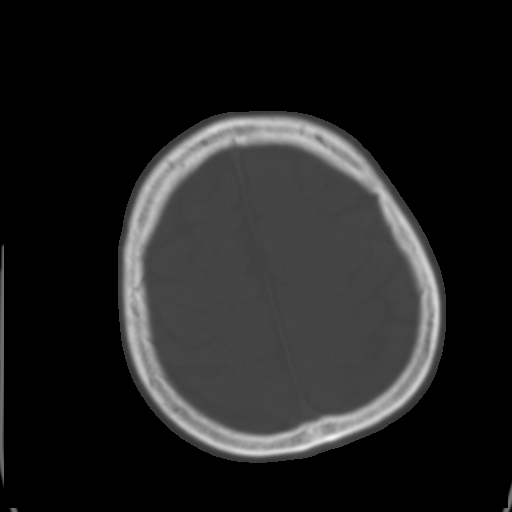
[im 26/36  brain]
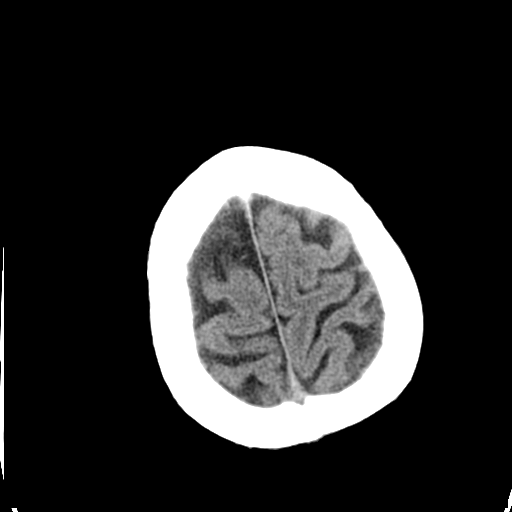
[im 28/36  brain]
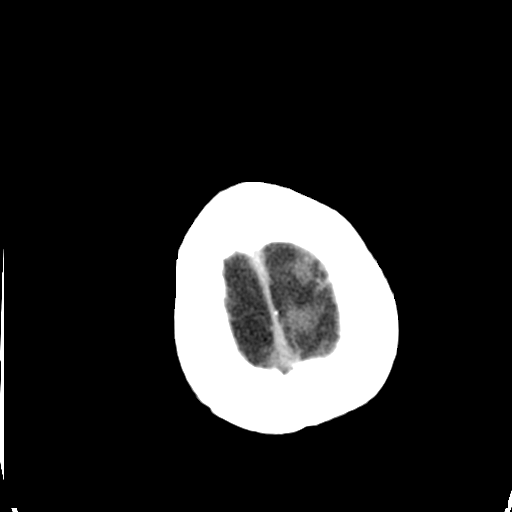
[im 31/36  brain]
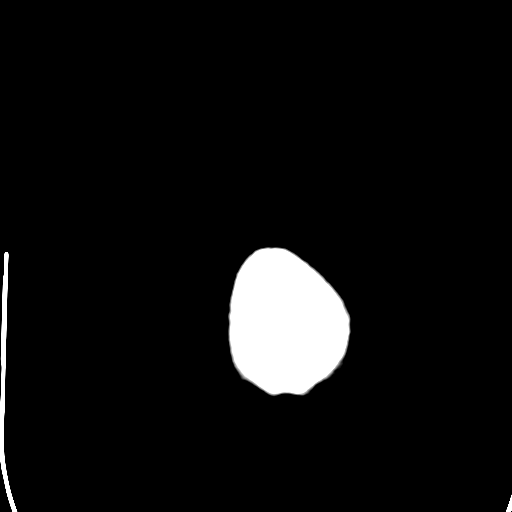
[im 33/36  brain]
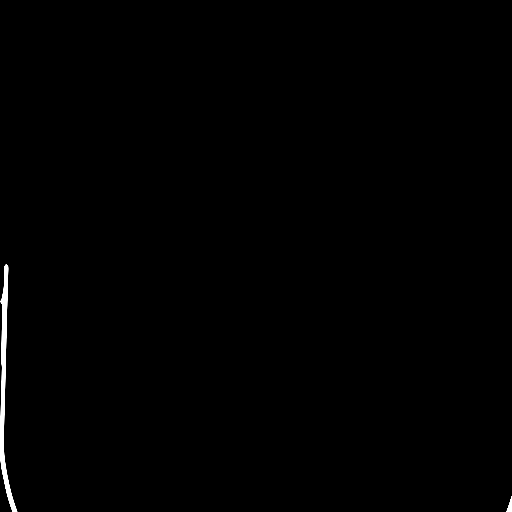
[im 33/36  bone]
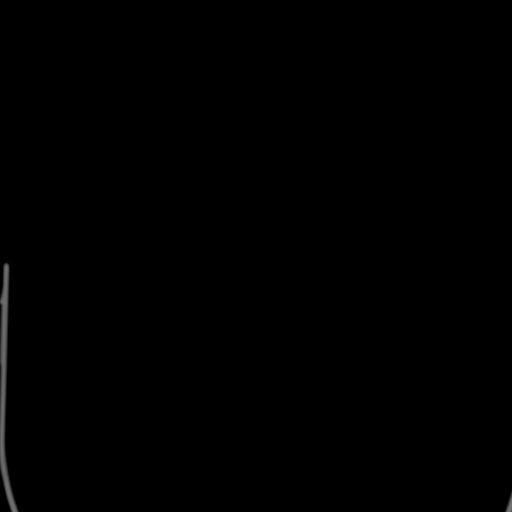

[13 of 30 positions shown; findings below may reference images not displayed]

FINDINGS: Motion degraded images.

No evidence of parenchymal hemorrhage or extra-axial fluid
collection. No mass lesion, mass effect, or midline shift.

No CT evidence of acute infarction.

Subcortical white matter and periventricular small vessel ischemic
changes.  Intracranial atherosclerosis.

Cerebral volume is age appropriate.  No ventriculomegaly.

The visualized paranasal sinuses are essentially clear. The mastoid
air cells are unopacified.

No evidence of calvarial fracture.
IMPRESSION: Motion degraded images.

No interval change from recent CT.

## 2014-08-01 NOTE — Discharge Instructions (Signed)

## 2014-08-06 IMAGING — CR DG CHEST 2V
2 series · 2 of 2 positions shown · non-contrast
Comparison: Chest radiograph performed 02/03/2012

CLINICAL DATA: Palpitations.

CHEST - 2 VIEW

[w chest lat]
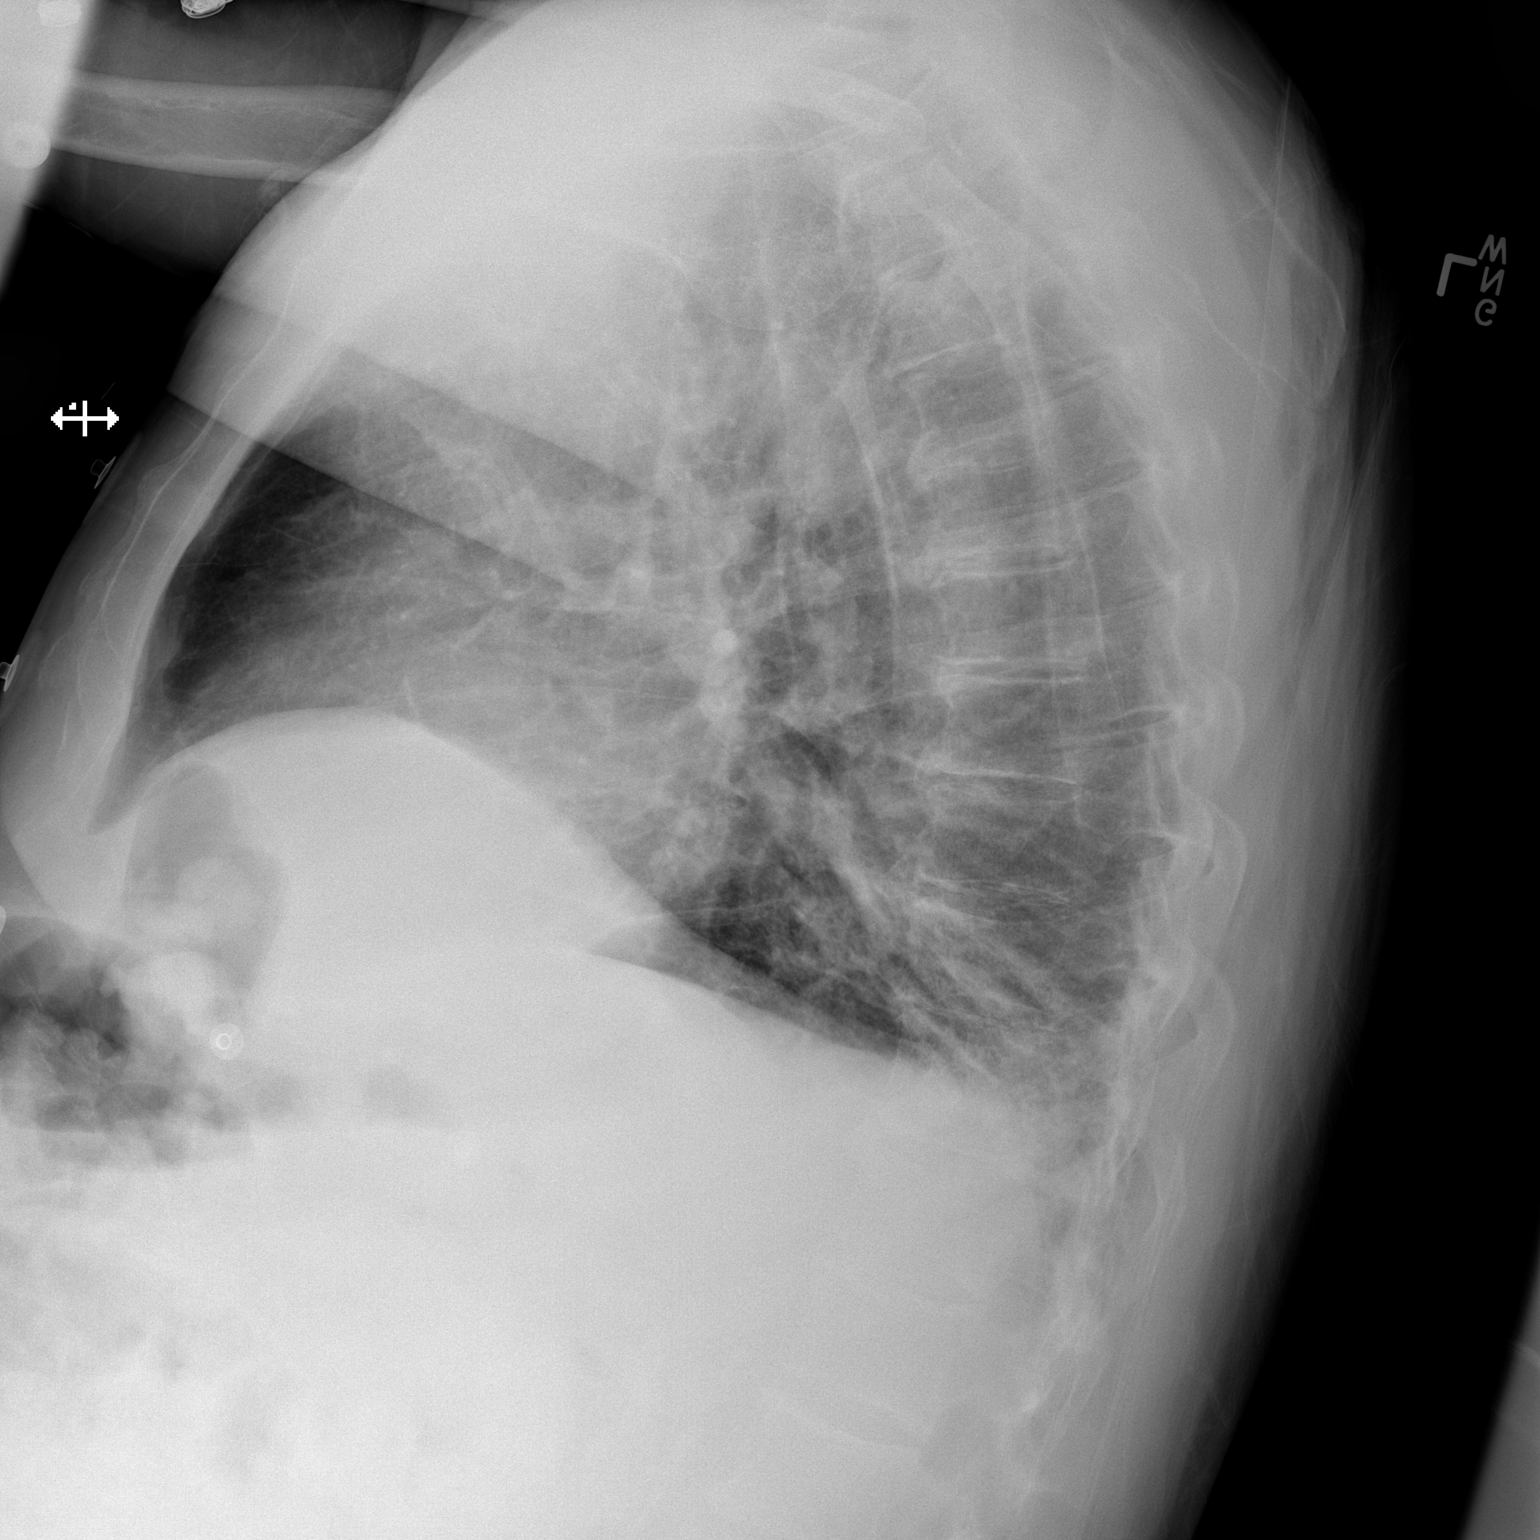

[x chest ap]
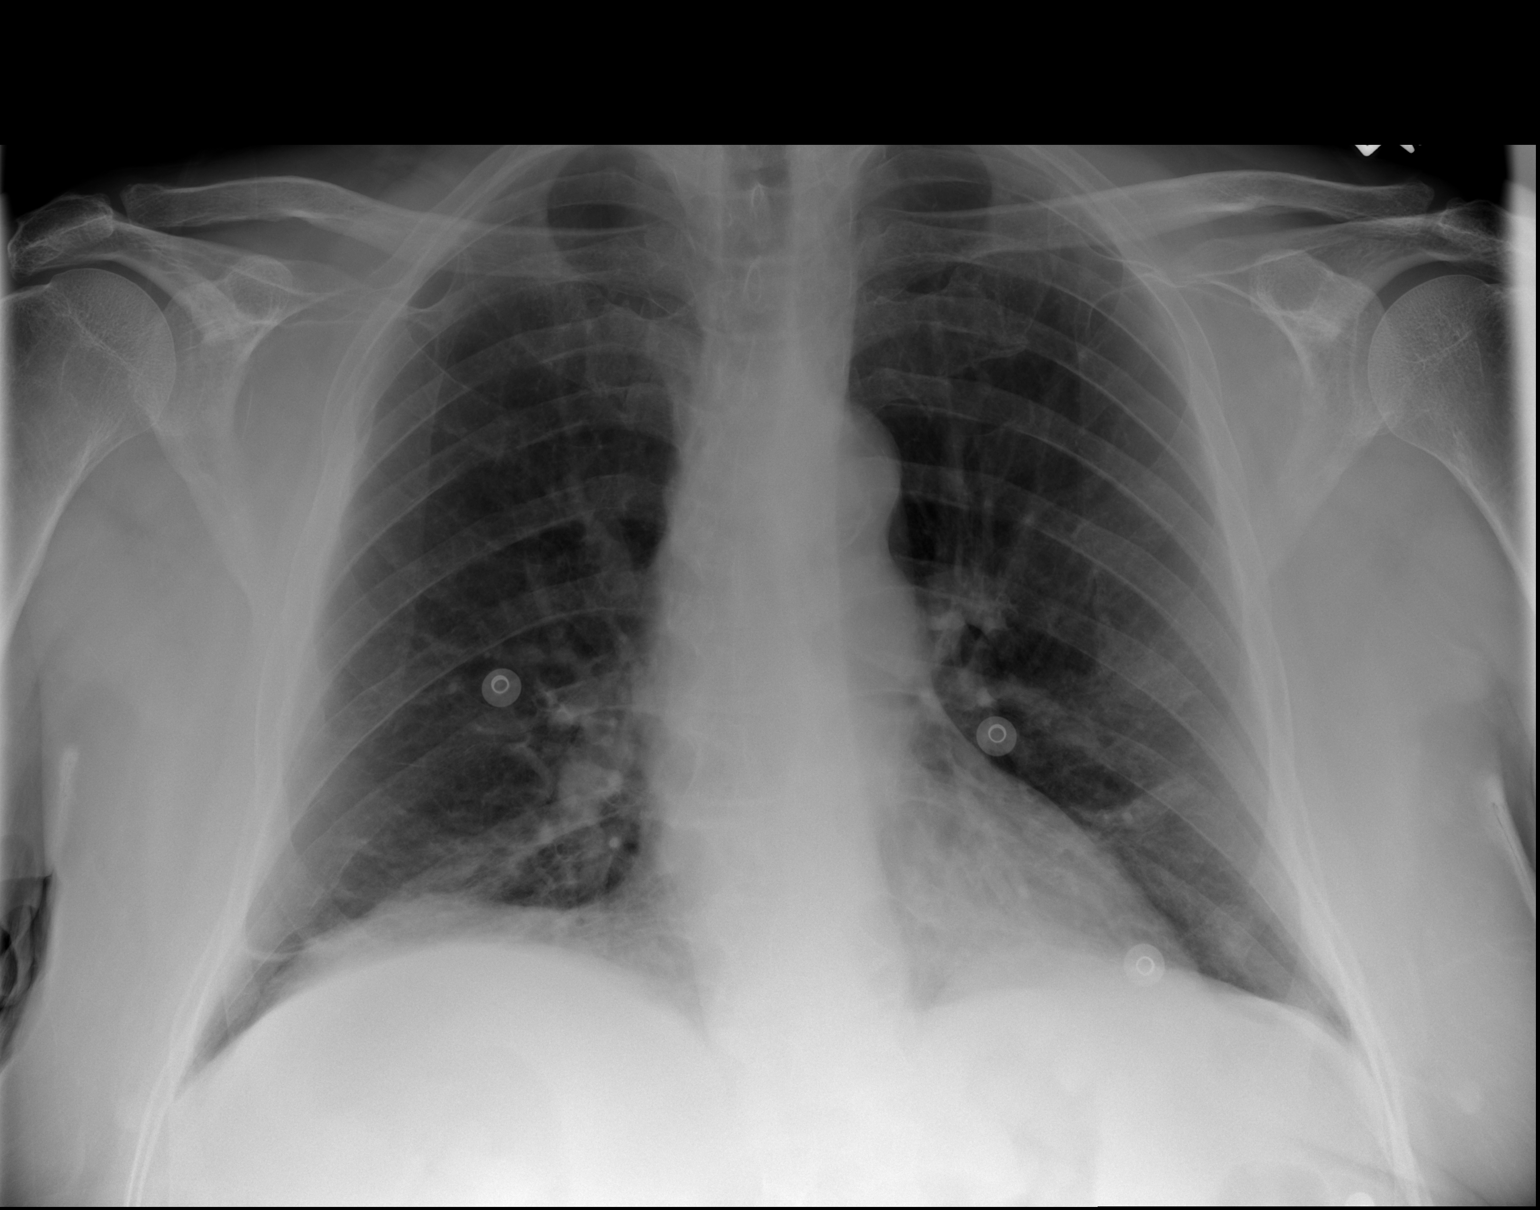

[2 of 2 positions shown; findings below may reference images not displayed]

FINDINGS: The lungs are well-aerated.  Bibasilar airspace opacities
may reflect atelectasis or possibly pneumonia.  There is no
evidence of pleural effusion or pneumothorax.  Nodular density at
the left lung base is thought to reflect the overlying rib.

The heart is normal in size; calcification is noted within the
aortic arch.  No acute osseous abnormalities are seen.
IMPRESSION: Bibasilar airspace opacities may reflect atelectasis or possibly
pneumonia.

## 2014-08-12 ENCOUNTER — Ambulatory Visit (INDEPENDENT_AMBULATORY_CARE_PROVIDER_SITE_OTHER): Payer: Commercial Managed Care - HMO | Admitting: Internal Medicine

## 2014-08-12 ENCOUNTER — Encounter: Payer: Self-pay | Admitting: Internal Medicine

## 2014-08-12 ENCOUNTER — Telehealth: Payer: Self-pay | Admitting: *Deleted

## 2014-08-12 VITALS — BP 132/84 | HR 59 | Ht 65.0 in | Wt 212.4 lb

## 2014-08-12 DIAGNOSIS — I4891 Unspecified atrial fibrillation: Secondary | ICD-10-CM
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
## 2014-08-12 NOTE — Patient Instructions (Signed)
Medication Instructions:  Your physician recommends that you continue on your current medications as directed. Please refer to the Current Medication list given to you today.   Labwork: none  Testing/Procedures: none  Follow-Up: Your physician wants you to follow-up in: October 2016 You will receive a reminder letter in the mail two months in advance. If you don't receive a letter, please call our office to schedule the follow-up appointment.
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
## 2014-08-13 ENCOUNTER — Encounter: Payer: Self-pay | Admitting: Family Medicine

## 2014-08-13 ENCOUNTER — Ambulatory Visit (INDEPENDENT_AMBULATORY_CARE_PROVIDER_SITE_OTHER): Payer: Commercial Managed Care - HMO | Admitting: Family Medicine

## 2014-08-13 VITALS — BP 118/89 | HR 71 | Temp 98.6°F | Ht 65.0 in | Wt 210.6 lb

## 2014-08-13 DIAGNOSIS — R197 Diarrhea, unspecified: Secondary | ICD-10-CM

## 2014-08-13 LAB — BASIC METABOLIC PANEL
BUN: 9 mg/dL (ref 6–23)
CALCIUM: 8.9 mg/dL (ref 8.4–10.5)
CO2: 26 meq/L (ref 19–32)
Chloride: 103 mEq/L (ref 96–112)
Creat: 1 mg/dL (ref 0.50–1.35)
Glucose, Bld: 84 mg/dL (ref 70–99)
Potassium: 4.3 mEq/L (ref 3.5–5.3)
SODIUM: 142 meq/L (ref 135–145)

## 2014-08-13 LAB — POCT URINALYSIS DIPSTICK
Blood, UA: NEGATIVE
Glucose, UA: NEGATIVE
Ketones, UA: NEGATIVE
Leukocytes, UA: NEGATIVE
Nitrite, UA: NEGATIVE
PH UA: 6.5
Protein, UA: 30
Spec Grav, UA: 1.025
UROBILINOGEN UA: 1

## 2014-08-13 LAB — CBC WITH DIFFERENTIAL/PLATELET
Basophils Absolute: 0 10*3/uL (ref 0.0–0.1)
Basophils Relative: 0 % (ref 0–1)
EOS ABS: 0.2 10*3/uL (ref 0.0–0.7)
Eosinophils Relative: 3 % (ref 0–5)
HEMATOCRIT: 45.7 % (ref 39.0–52.0)
HEMOGLOBIN: 14.7 g/dL (ref 13.0–17.0)
Lymphocytes Relative: 39 % (ref 12–46)
Lymphs Abs: 2.8 10*3/uL (ref 0.7–4.0)
MCH: 28.4 pg (ref 26.0–34.0)
MCHC: 32.2 g/dL (ref 30.0–36.0)
MCV: 88.4 fL (ref 78.0–100.0)
MONOS PCT: 8 % (ref 3–12)
MPV: 9.5 fL (ref 8.6–12.4)
Monocytes Absolute: 0.6 10*3/uL (ref 0.1–1.0)
NEUTROS PCT: 50 % (ref 43–77)
Neutro Abs: 3.6 10*3/uL (ref 1.7–7.7)
Platelets: 360 10*3/uL (ref 150–400)
RBC: 5.17 MIL/uL (ref 4.22–5.81)
RDW: 15.2 % (ref 11.5–15.5)
WBC: 7.1 10*3/uL (ref 4.0–10.5)

## 2014-08-13 LAB — POCT UA - MICROSCOPIC ONLY

## 2014-08-13 NOTE — Progress Notes (Signed)
Subjective:    Patient ID: Patrick Holt, male    DOB: 1950-04-04, 64 y.o.   MRN: [id number]  HPI  Diarrhea: Patient presents to family medicine for diarrhea that has been consistent since his hospital discharge 07/16/2014. Patient reports since Saturday evening he has noticed that his stools are not loose to watery but also black in color. He reports no dryness, no melena, no hematochezia. He does have a history of diverticulosis and  history of dermatomyositis. Last colonoscopy 2014- 2 sessile polyps (2-67mm) tubular adenoma, diverticulitis present. Patient does admit to taking Pepto-Bismol prior to change in colors of stools. He has experienced fecal incontinence and is wearing an adult diaper. Patient has not tried Imodium for his diarrhea. Does admit to decreased appetite. He denies any nausea, vomiting, fever or chills. Denies any abdominal pain.  Urinary incontinence: She continues to have urinary incontinence, this has been chronic in nature. He reports he is taking oxybutynin 5 mg 3 times a day, and this is not effective. He has been diagnosed with urge incontinence in the past, but has recently felt like he does not have the sensation to urinate before he notices himself urinating. He has been wearing adult diapers.  Delusional disorder: Patient was recently discharged from [facility] for delusional disorder. Patient had instructions to follow-up with [facility] in the outpatient setting. Patient states that he has not called to set up his [facility] appointment, because he thought he was supposed to follow-up with the psychiatrist. Patient was told at discharge [place] [facility] to follow-up with [facility], as well as his follow-up appointment at [facility].   Current every day smoker Past Medical History  Diagnosis Date  . Rheumatoid arthritis(714.0)   . Obesity   . Major depression     with acute psychotic break in 06/2010  . Hypertension   . Hyperlipidemia   . Diverticulosis of colon   .  COPD (chronic obstructive pulmonary disease)   . Anxiety   . GERD (gastroesophageal reflux disease)   . Vertigo   . Fibromyalgia   . Dermatomyositis   . Myocardial infarct     mulitple (1999, 2000, 2004)  . Raynaud's disease   . Narcotic dependence   . Peripheral neuropathy   . Internal hemorrhoids   . Ischemic heart disease   . Hiatal hernia   . Gastritis   . Diverticulitis   . Hx of adenomatous colonic polyps   . Nephrolithiasis   . Anemia   . Esophageal stricture   . Esophageal dysmotility   . Dermatomyositis   . Urge incontinence   . Otosclerosis   . Bipolar 1 disorder   . OCD (obsessive compulsive disorder)   . Sarcoidosis   . Paroxysmal a-fib   . Dysrhythmia     "irregular" (11/15/2012)  . Type II diabetes mellitus   . Seizures   . Headache(784.0)     "severe; get them often" (11/15/2012)  . Subarachnoid hemorrhage 01/2012    with subdural  hematoma.   . Atrial fibrillation 01/2012    with RVR  . Conversion disorder 06/2010  . History of narcotic addiction   . Asthma   . Cancer   . Collagen vascular disease   . Renal insufficiency    Allergies  Allergen Reactions  . Immune Globulins Other (See Comments)    Acute renal failure  . Ciprofloxacin Swelling  . Flagyl [Metronidazole] Swelling  . Lisinopril Diarrhea  . Sulfa Antibiotics Other (See Comments)    blisters  . Betamethasone  Dipropionate Other (See Comments)    Unknown  . Bupropion Hcl Other (See Comments)    Unknown  . Clobetasol Other (See Comments)    Unknown  . Codeine Other (See Comments)    Unknown  . Escitalopram Oxalate Other (See Comments)    Unknown  . Fluoxetine Hcl Other (See Comments)    Unknown  . Fluticasone-Salmeterol Other (See Comments)    Unknown  . Furosemide Other (See Comments)    Unknown  . Paroxetine Other (See Comments)    Unknown  . Penicillins Other (See Comments)    Unknown  . Tacrolimus Other (See Comments)    Unknown  . Tetanus Toxoid Other (See Comments)      Unknown  . Tuberculin Purified Protein Derivative Other (See Comments)    Unknown   Past Surgical History  Procedure Laterality Date  . Knee arthroscopy w/ meniscal repair Left 2009  . Lumbar disc surgery    . Squamous papilloma   2010    removed by Dr. Constance Holster ENT, noted on tongue  . Esophagogastroduodenoscopy N/A 09/27/2012    Procedure: ESOPHAGOGASTRODUODENOSCOPY (EGD);  Surgeon: Lafayette Dragon, MD;  Location: [facility];  Service: Endoscopy;  Laterality: N/A;  . Colonoscopy N/A 09/27/2012    Procedure: COLONOSCOPY;  Surgeon: Lafayette Dragon, MD;  Location: [facility];  Service: Endoscopy;  Laterality: N/A;  . Cataract extraction w/ intraocular lens implant Left   . Lymph node dissection Right 1970's    "neck; dr thought I had Hodgkins; turned out to be sarcoidosis" (11/15/2012)  . Tonsillectomy    . Carpal tunnel release Bilateral   . Cardiac catheterization    . Back surgery     Review of Systems Per history of present illness    Objective:   Physical Exam BP 118/89 mmHg  Pulse 71  Temp(Src) 98.6 F (37 C) (Oral)  Ht 5\' 5"  (1.651 m)  Wt 210 lb 9.6 oz (95.528 kg)  BMI 35.05 kg/m2 Gen: NAD. Toxic in appearance, well-developed, well nourished Caucasian male. Well Groomed. Smells of cigarette smoke. HEENT: AT. [place]. Bilateral TM visualized and normal in appearance. Bilateral eyes without injections or icterus. MMM.  CV: RRR  Chest: CTAB, no wheeze or crackles. Mild cough present during exam. Abd: Soft. Overweight. NTND. BS present. No Masses palpated.  GU: Patient declined rectal exam. Ext: No erythema. No edema.     Assessment & Plan:  NOMAR BROAD is a 64 y.o. Caucasian male with fecal incontinence, diarrhea.  - BMP, CBC, UA,  fecal occult cards sent home with patient since he declined rectal. - Would consider GI referral if Hemoccults cards positive, patient continues to have diarrhea on follow-up. - No red flags on exam today, except for history of fecal  incontinence. Black colored stools is likely from iron supplementation and/or Pepto-Bismol use. - Patient was asked to make an appointment in geriatrics clinic within the next 4 weeks.

## 2014-08-13 NOTE — Patient Instructions (Signed)
I have sent you home with 3 fecal occult cards, please make sure to follow directions and send this back to [place]. Make Sure to follow-up with [facility], call tomorrow to make an appointment with them as soon as possible. [address] [place], [place] I have called in some Imodium for you to start taking for your diarrhea.  I will collect lab work for me today, we will call you within the next few days once we get all of the results back to discuss. Make an appointment with the geriatric clinic here as soon as possible.

## 2014-08-14 ENCOUNTER — Other Ambulatory Visit: Payer: Self-pay | Admitting: Family Medicine

## 2014-08-14 ENCOUNTER — Telehealth: Payer: Self-pay | Admitting: Family Medicine

## 2014-08-14 MED ORDER — LOPERAMIDE HCL 2 MG PO CAPS: ORAL_CAPSULE | ORAL | Status: DC

## 2014-08-14 NOTE — Telephone Encounter (Signed)
Pt informed and agreeable of the below.   He is awaiting callback about scheduling in [facility], due to schedule not being out yet. Frances Joynt, Salome Spotted

## 2014-08-14 NOTE — Telephone Encounter (Signed)
Will forward to MD to see this will be called in or does patient need to pick up OTC. Jazmin Hartsell,CMA

## 2014-08-14 NOTE — Telephone Encounter (Signed)
He can pick up OTC if he desires ( not certain of cost). I have prescribed him tabs as well though. Encourage him not to take more than the prescription is written for, if he continues to have diarrhea or his fecal occult cards (we sent home with him) are positive, then we will send him to GI. In addition his labs are returned and Blood count is good, no signs of infection of the blood or urine, electrolytes (sodium/potassium etc) are normal.  Please encourage him to make an appointment with geriatrics clininc, if he has not done so already. Thanks.

## 2014-08-14 NOTE — Telephone Encounter (Signed)
Pt called because the pharmacy has not received the prescription for his Imodium. jw

## 2014-08-15 ENCOUNTER — Encounter: Payer: Self-pay | Admitting: Family Medicine

## 2014-08-15 ENCOUNTER — Telehealth: Payer: Self-pay | Admitting: Family Medicine

## 2014-08-15 ENCOUNTER — Other Ambulatory Visit: Payer: Self-pay | Admitting: Family Medicine

## 2014-08-15 NOTE — Assessment & Plan Note (Signed)
Patrick Holt is a 64 y.o. Caucasian male with fecal incontinence, diarrhea.  - BMP, CBC, UA,  fecal occult cards sent home with patient since he declined rectal. - Would consider GI referral if Hemoccults cards positive, patient continues to have diarrhea on follow-up. - No red flags on exam today, except for history of fecal incontinence. Black colored stools is likely from iron supplementation and/or Pepto-Bismol use. - Patient was asked to make an appointment in geriatrics clinic within the next 4 weeks.

## 2014-08-15 NOTE — Telephone Encounter (Signed)
Doy Hutching, LCSW informed that forms are complete and ready for pick up.  Derl Barrow, RN

## 2014-08-15 NOTE — Telephone Encounter (Signed)
Completed social work/FL2 form request. Patient's problem list and medications will need to be printed out and provided with completed form. Please call Wells Guiles, phone number on the front of the forms to let her know these have been completed and faxed them to the appropriate number. Forms have been given to [place]. Thank you

## 2014-08-26 IMAGING — CT CT HEAD W/O CM
2 series · 16 of 30 positions shown, 20 images · non-contrast
Comparison: 02/12/2012

CLINICAL DATA: Confusion, mental status change.

CT HEAD WITHOUT CONTRAST
TECHNIQUE: Contiguous axial images were obtained from the base of
the skull through the vertex without contrast.

[Series 2: head w/o · axial · non-contrast · 0.47mm/px · z∈[+1273,+1413]mm · 13 of 34 slices shown, 17 images]
[im 3/34  brain]
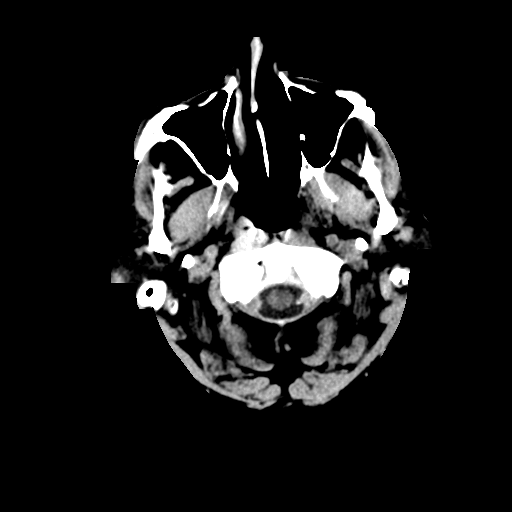
[im 3/34  bone]
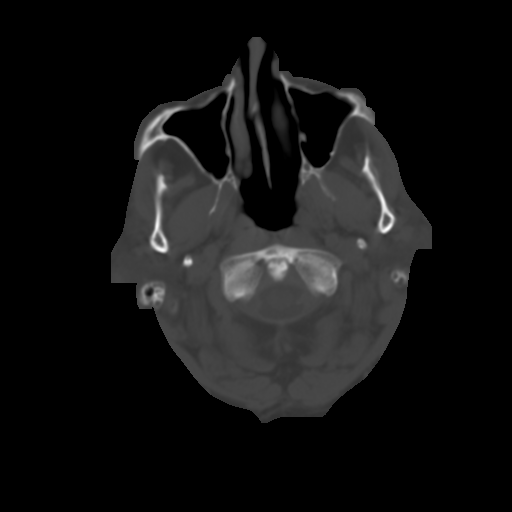
[im 5/34  brain]
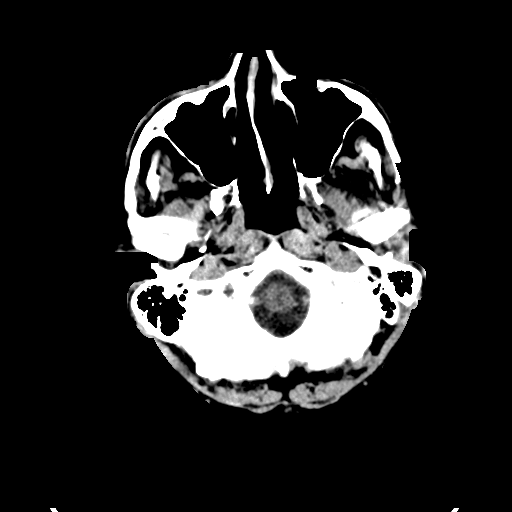
[im 8/34  brain]
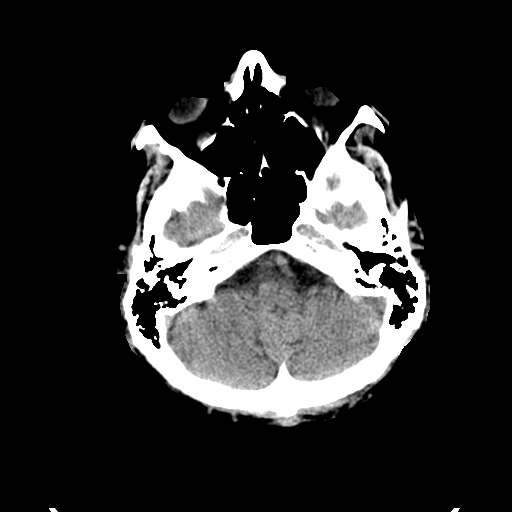
[im 10/34  brain]
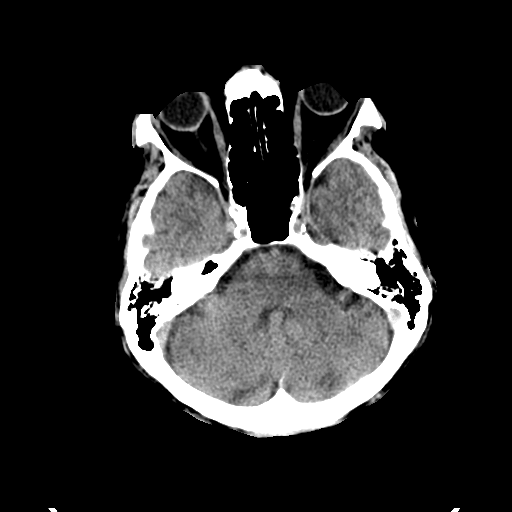
[im 12/34  brain]
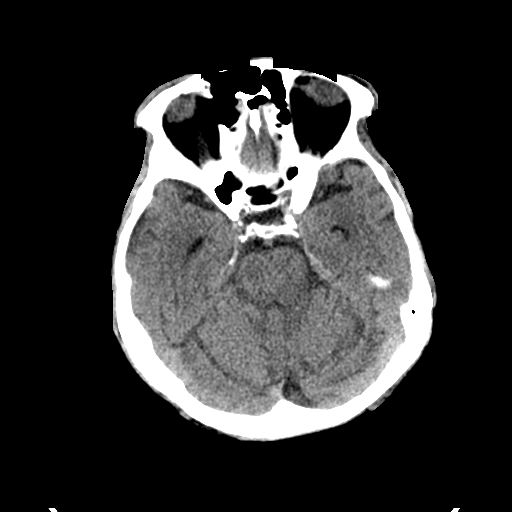
[im 12/34  bone]
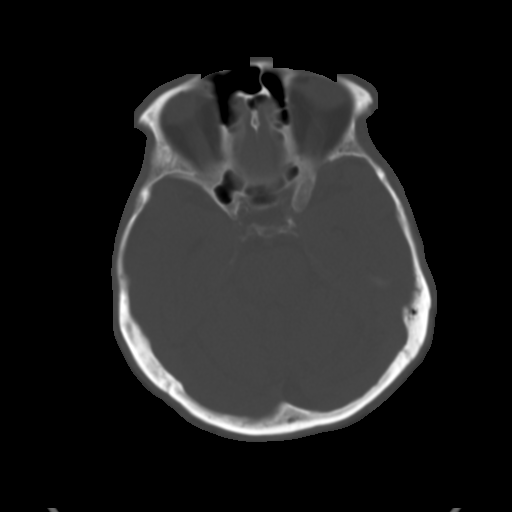
[im 15/34  brain]
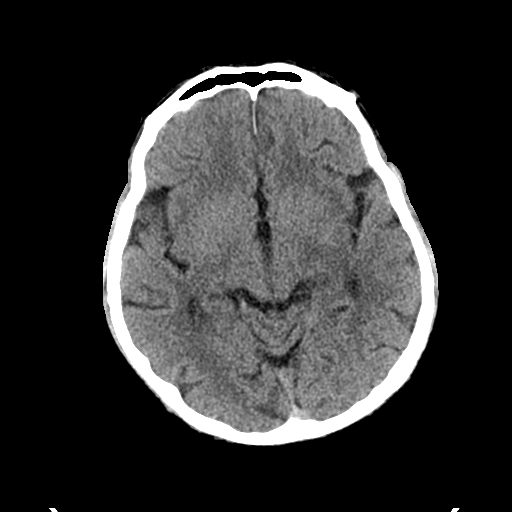
[im 17/34  brain]
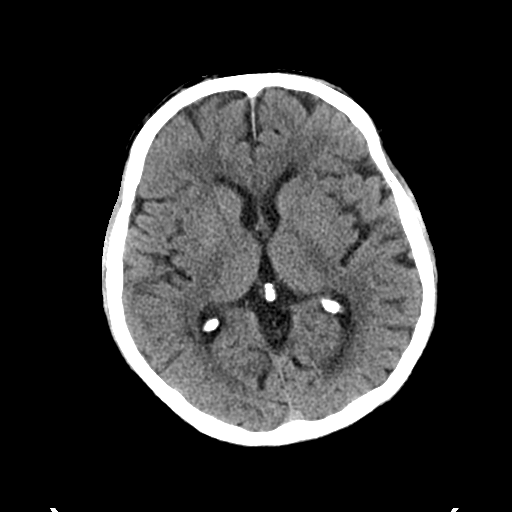
[im 19/34  brain]
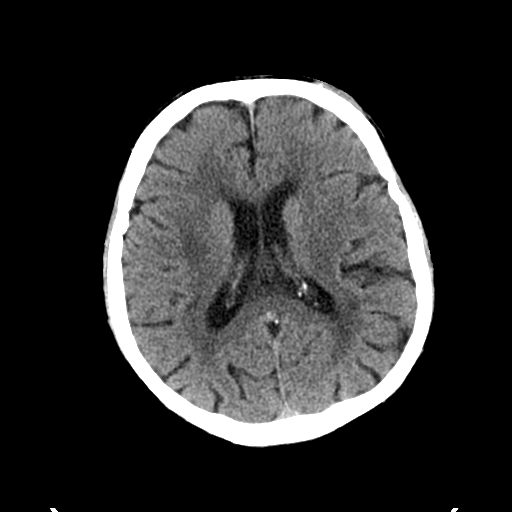
[im 22/34  brain]
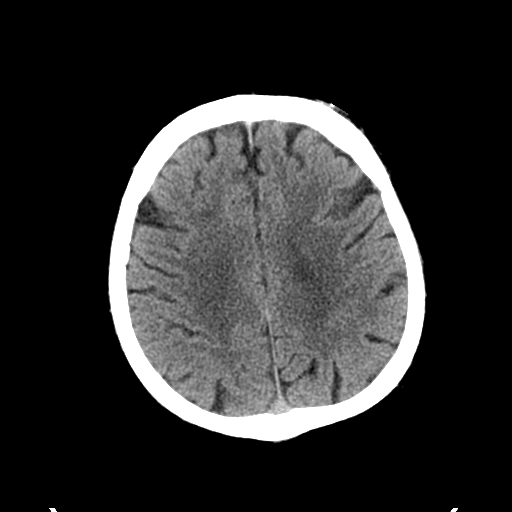
[im 22/34  bone]
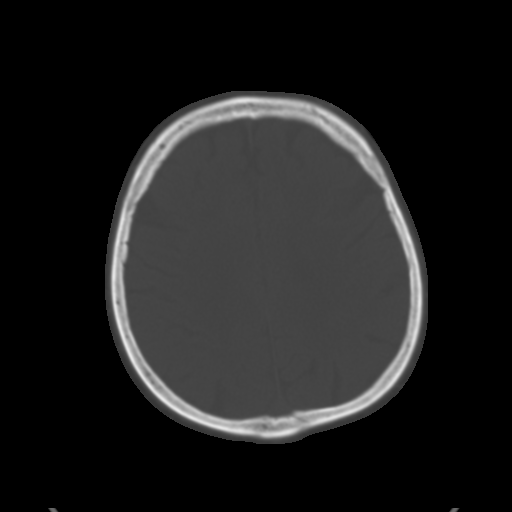
[im 24/34  brain]
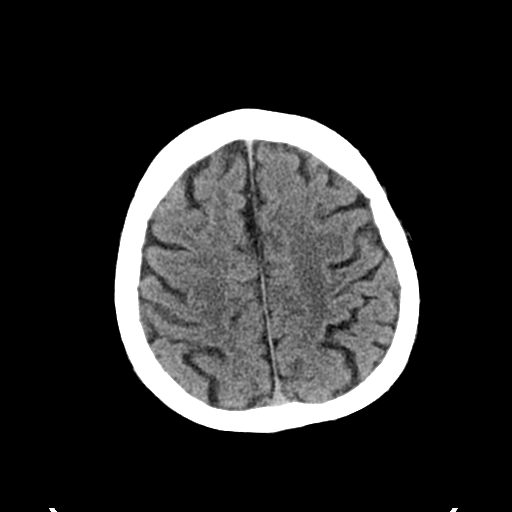
[im 26/34  brain]
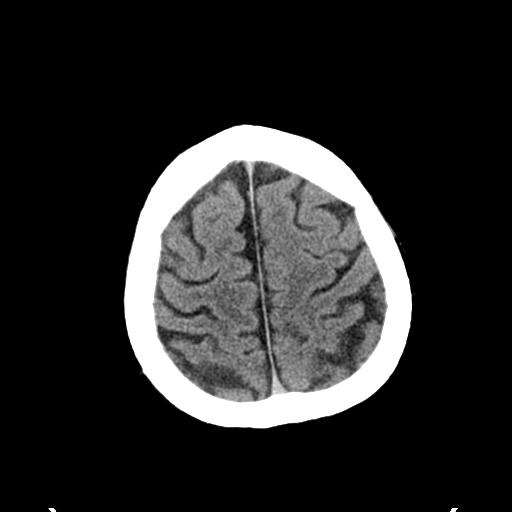
[im 29/34  brain]
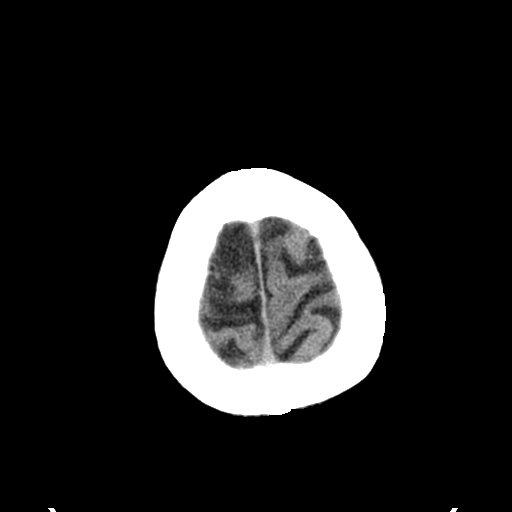
[im 31/34  brain]
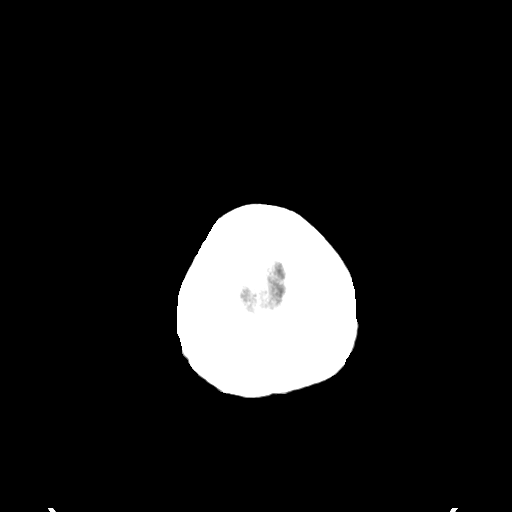
[im 31/34  bone]
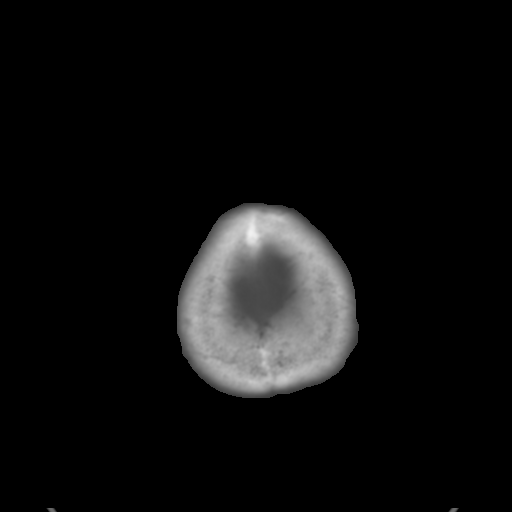

[Series 3: bone windows · axial · 0.47mm/px · z∈[+1273,+1318]mm · 3 of 34 slices shown]
[im 3/34  bone]
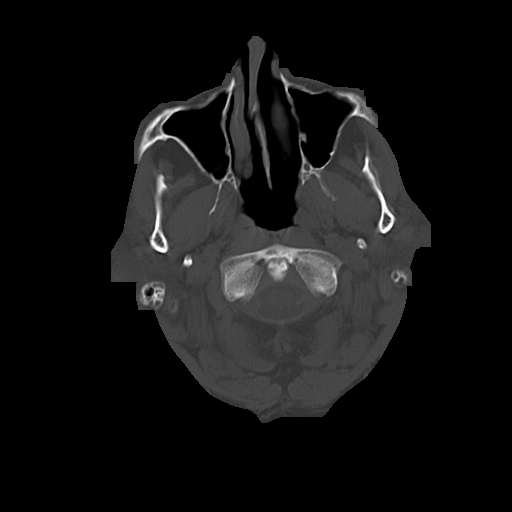
[im 8/34  bone]
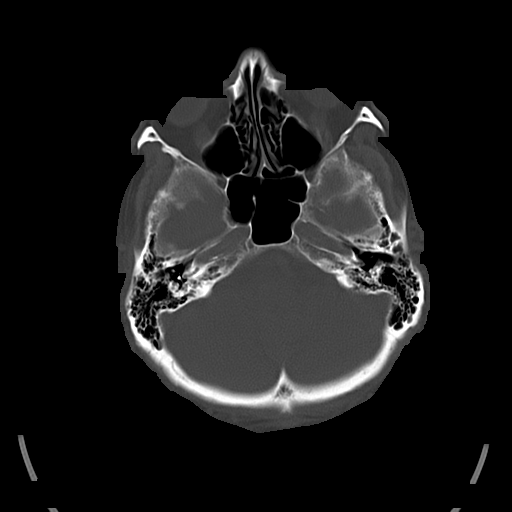
[im 12/34  bone]
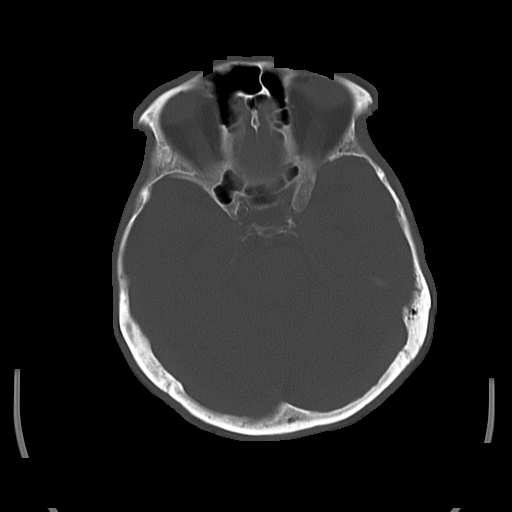

[16 of 30 positions shown; findings below may reference images not displayed]

FINDINGS: Atherosclerotic and physiologic intracranial
calcifications. Diffuse parenchymal atrophy. Patchy areas of
hypoattenuation in deep and periventricular white matter
bilaterally. Negative for acute intracranial hemorrhage, mass
lesion, acute infarction, midline shift, or mass-effect. Acute
infarct may be inapparent on noncontrast CT. Ventricles and sulci
symmetric. Bone windows demonstrate no focal lesion.
IMPRESSION: 1. Negative for bleed or other acute intracranial process.

2. Atrophy and nonspecific white matter changes

## 2014-08-27 IMAGING — CR DG CHEST 2V
2 series · 2 of 2 positions shown · non-contrast
Comparison: 02/09/2012

CLINICAL DATA: Medical clearance, psychiatric evaluation.

CHEST - 2 VIEW

[w chest lat]
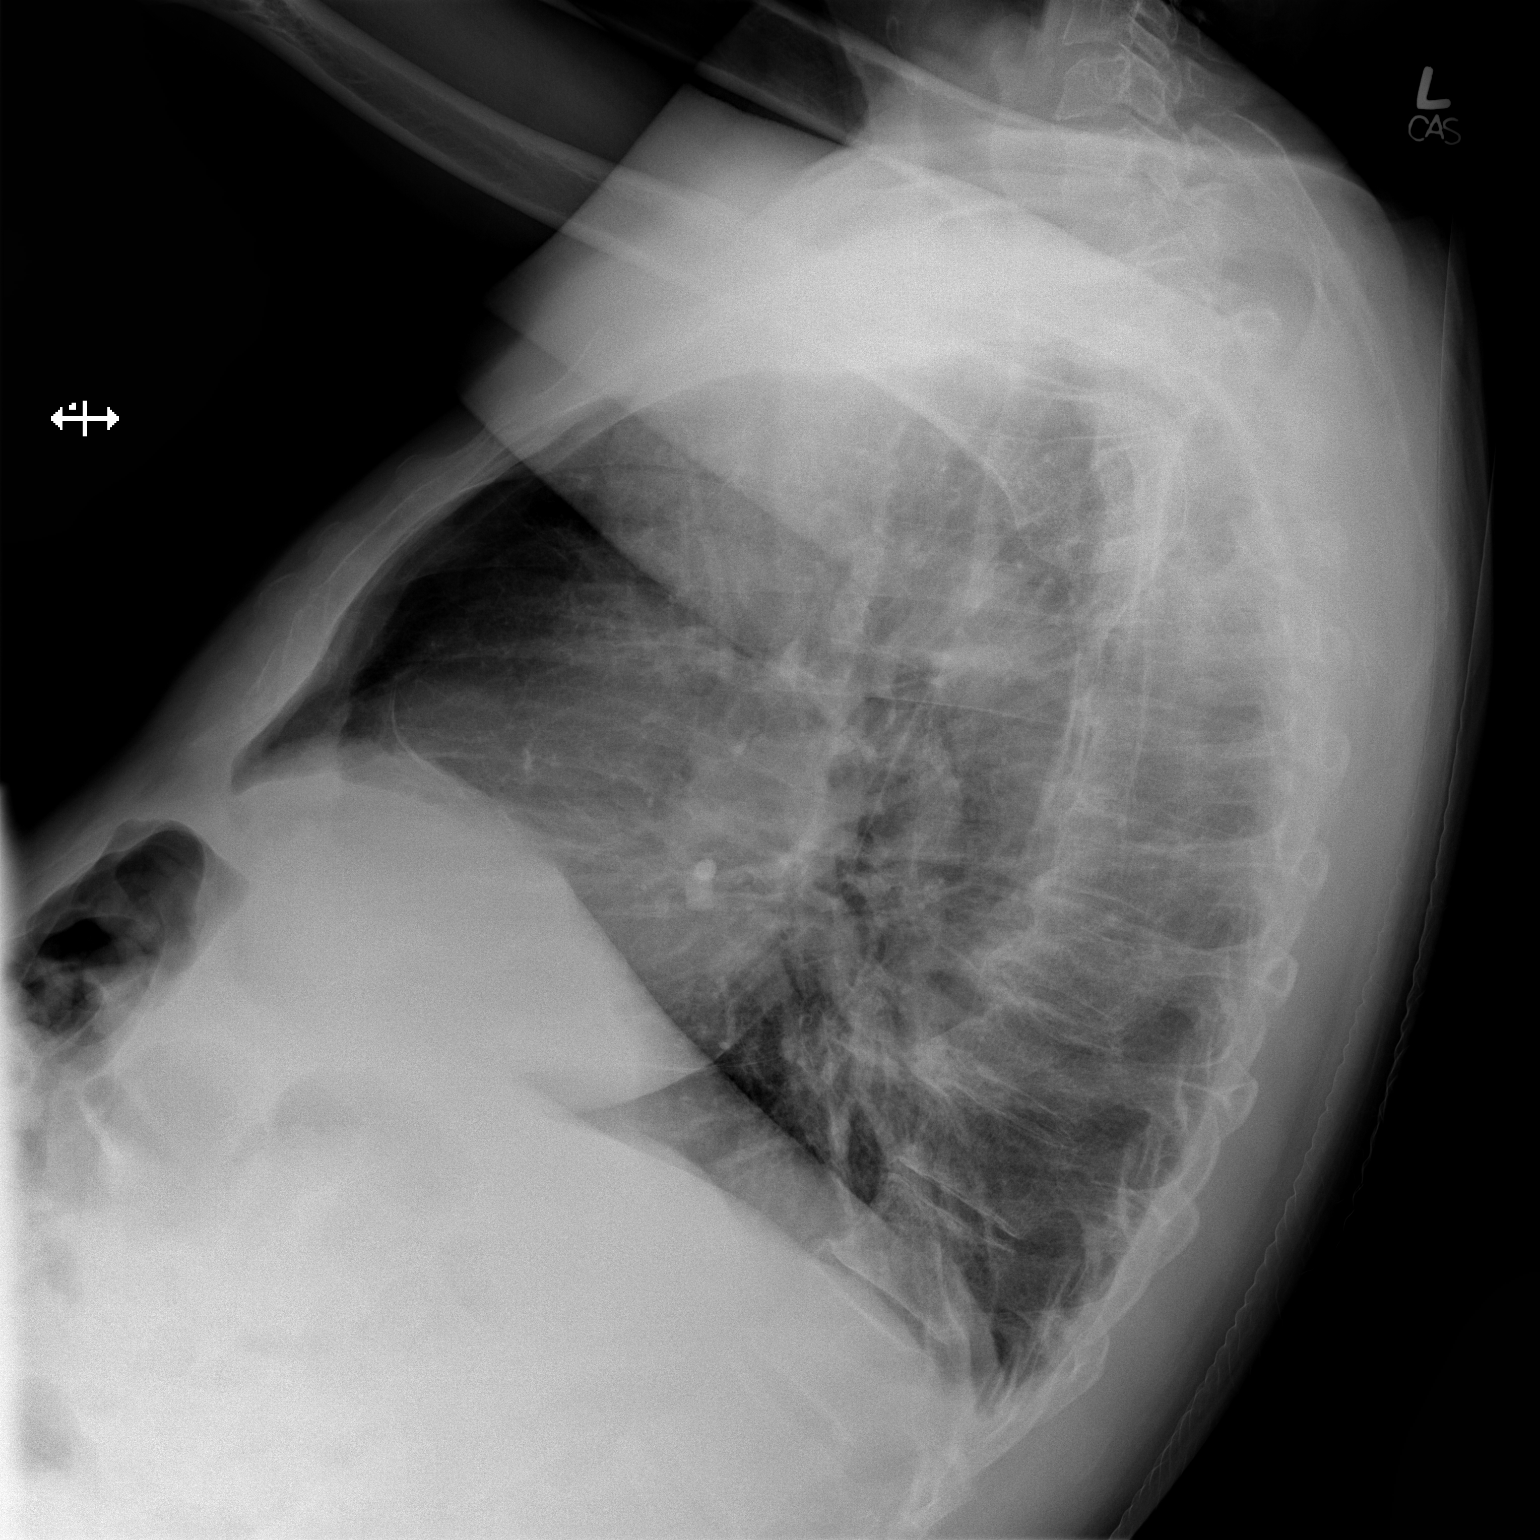

[x chest ap]
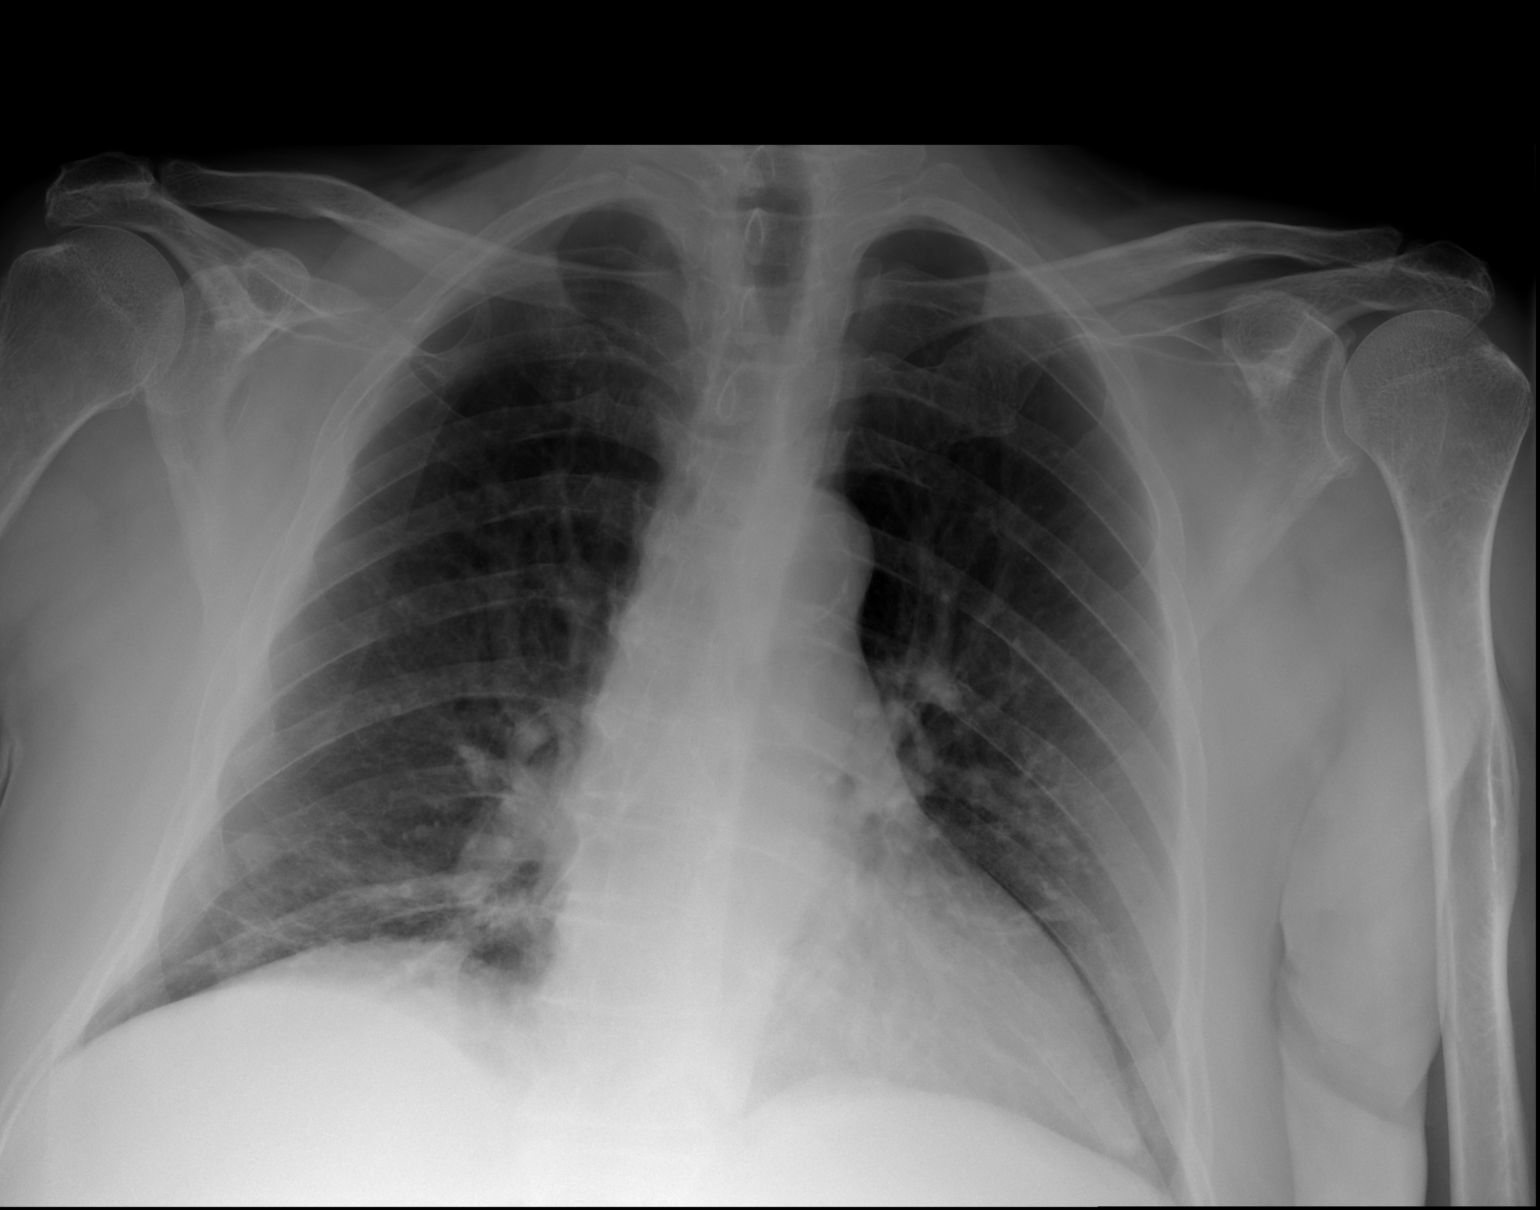

[2 of 2 positions shown; findings below may reference images not displayed]

FINDINGS: There is patchy coarse subsegmental atelectasis or
scarring in the lung bases, right greater than left as before.  No
new infiltrate.  No effusion.  Spurring in the lower thoracic
spine.  Heart size upper limits normal.  Atheromatous aortic arch.
IMPRESSION: 1.  Chronic bibasilar changes.  No acute disease.

## 2014-09-09 ENCOUNTER — Encounter (HOSPITAL_COMMUNITY): Payer: Self-pay

## 2014-09-09 ENCOUNTER — Emergency Department (HOSPITAL_COMMUNITY)
Admission: EM | Admit: 2014-09-09 | Discharge: 2014-09-10 | Disposition: A | Discharge status: Home / Self Care | Payer: Commercial Managed Care - HMO | Attending: Emergency Medicine | Admitting: Emergency Medicine

## 2014-09-09 DIAGNOSIS — R200 Anesthesia of skin: Secondary | ICD-10-CM | POA: Insufficient documentation

## 2014-09-09 DIAGNOSIS — Z862 Personal history of diseases of the blood and blood-forming organs and certain disorders involving the immune mechanism: Secondary | ICD-10-CM | POA: Diagnosis not present

## 2014-09-09 DIAGNOSIS — Z880 Allergy status to penicillin: Secondary | ICD-10-CM | POA: Insufficient documentation

## 2014-09-09 DIAGNOSIS — K219 Gastro-esophageal reflux disease without esophagitis: Secondary | ICD-10-CM | POA: Insufficient documentation

## 2014-09-09 DIAGNOSIS — Z7982 Long term (current) use of aspirin: Secondary | ICD-10-CM | POA: Diagnosis not present

## 2014-09-09 DIAGNOSIS — M79605 Pain in left leg: Secondary | ICD-10-CM | POA: Diagnosis present

## 2014-09-09 DIAGNOSIS — F419 Anxiety disorder, unspecified: Secondary | ICD-10-CM | POA: Insufficient documentation

## 2014-09-09 DIAGNOSIS — F319 Bipolar disorder, unspecified: Secondary | ICD-10-CM | POA: Insufficient documentation

## 2014-09-09 DIAGNOSIS — E669 Obesity, unspecified: Secondary | ICD-10-CM | POA: Insufficient documentation

## 2014-09-09 DIAGNOSIS — Z87448 Personal history of other diseases of urinary system: Secondary | ICD-10-CM | POA: Diagnosis not present

## 2014-09-09 DIAGNOSIS — Z720 Tobacco use: Secondary | ICD-10-CM | POA: Diagnosis not present

## 2014-09-09 DIAGNOSIS — I480 Paroxysmal atrial fibrillation: Secondary | ICD-10-CM | POA: Diagnosis not present

## 2014-09-09 DIAGNOSIS — J449 Chronic obstructive pulmonary disease, unspecified: Secondary | ICD-10-CM | POA: Diagnosis not present

## 2014-09-09 DIAGNOSIS — Z79899 Other long term (current) drug therapy: Secondary | ICD-10-CM | POA: Diagnosis not present

## 2014-09-09 DIAGNOSIS — M25472 Effusion, left ankle: Secondary | ICD-10-CM | POA: Diagnosis not present

## 2014-09-09 DIAGNOSIS — M069 Rheumatoid arthritis, unspecified: Secondary | ICD-10-CM | POA: Insufficient documentation

## 2014-09-09 DIAGNOSIS — Z859 Personal history of malignant neoplasm, unspecified: Secondary | ICD-10-CM | POA: Diagnosis not present

## 2014-09-09 DIAGNOSIS — M7989 Other specified soft tissue disorders: Secondary | ICD-10-CM

## 2014-09-09 DIAGNOSIS — G40909 Epilepsy, unspecified, not intractable, without status epilepticus: Secondary | ICD-10-CM | POA: Diagnosis not present

## 2014-09-09 DIAGNOSIS — I252 Old myocardial infarction: Secondary | ICD-10-CM | POA: Insufficient documentation

## 2014-09-09 DIAGNOSIS — Z9889 Other specified postprocedural states: Secondary | ICD-10-CM | POA: Insufficient documentation

## 2014-09-09 DIAGNOSIS — Z86010 Personal history of colonic polyps: Secondary | ICD-10-CM | POA: Insufficient documentation

## 2014-09-09 DIAGNOSIS — E785 Hyperlipidemia, unspecified: Secondary | ICD-10-CM | POA: Insufficient documentation

## 2014-09-09 DIAGNOSIS — E119 Type 2 diabetes mellitus without complications: Secondary | ICD-10-CM | POA: Insufficient documentation

## 2014-09-09 DIAGNOSIS — I10 Essential (primary) hypertension: Secondary | ICD-10-CM | POA: Insufficient documentation

## 2014-09-09 LAB — CBC WITH DIFFERENTIAL/PLATELET
BASOS PCT: 0 % (ref 0–1)
Basophils Absolute: 0 10*3/uL (ref 0.0–0.1)
EOS PCT: 3 % (ref 0–5)
Eosinophils Absolute: 0.2 10*3/uL (ref 0.0–0.7)
HCT: 42.4 % (ref 39.0–52.0)
HEMOGLOBIN: 13.9 g/dL (ref 13.0–17.0)
LYMPHS PCT: 34 % (ref 12–46)
Lymphs Abs: 2.1 10*3/uL (ref 0.7–4.0)
MCH: 29.3 pg (ref 26.0–34.0)
MCHC: 32.8 g/dL (ref 30.0–36.0)
MCV: 89.3 fL (ref 78.0–100.0)
MONO ABS: 0.7 10*3/uL (ref 0.1–1.0)
Monocytes Relative: 11 % (ref 3–12)
NEUTROS PCT: 52 % (ref 43–77)
Neutro Abs: 3.2 10*3/uL (ref 1.7–7.7)
PLATELETS: 239 10*3/uL (ref 150–400)
RBC: 4.75 MIL/uL (ref 4.22–5.81)
RDW: 15.7 % — ABNORMAL HIGH (ref 11.5–15.5)
WBC: 6.1 10*3/uL (ref 4.0–10.5)

## 2014-09-09 NOTE — ED Provider Notes (Signed)
CSN: [id number]     Arrival date & time 09/09/14  2310 History   First MD Initiated Contact with Patient 09/09/14 2318     Chief Complaint  Patient presents with  . Leg Pain     (Consider location/radiation/quality/duration/timing/severity/associated sxs/prior Treatment) Patient is a 64 y.o. male presenting with leg pain. The history is provided by the patient.  Leg Pain Location:  Foot and leg Time since incident:  2 days Injury: no   Leg location:  L lower leg Foot location:  L foot Pain details:    Quality:  Aching   Severity:  Mild   Onset quality:  Gradual   Duration:  2 days   Timing:  Constant   Progression:  Worsening Chronicity:  New Dislocation: no   Foreign body present:  No foreign bodies Prior injury to area:  No Relieved by:  Nothing Worsened by:  Nothing tried Ineffective treatments:  None tried Associated symptoms: swelling   Associated symptoms: no decreased ROM, no fatigue, no fever and no numbness     Past Medical History  Diagnosis Date  . Rheumatoid arthritis(714.0)   . Obesity   . Major depression     with acute psychotic break in 06/2010  . Hypertension   . Hyperlipidemia   . Diverticulosis of colon   . COPD (chronic obstructive pulmonary disease)   . Anxiety   . GERD (gastroesophageal reflux disease)   . Vertigo   . Fibromyalgia   . Dermatomyositis   . Myocardial infarct     mulitple (1999, 2000, 2004)  . Raynaud's disease   . Narcotic dependence   . Peripheral neuropathy   . Internal hemorrhoids   . Ischemic heart disease   . Hiatal hernia   . Gastritis   . Diverticulitis   . Hx of adenomatous colonic polyps   . Nephrolithiasis   . Anemia   . Esophageal stricture   . Esophageal dysmotility   . Dermatomyositis   . Urge incontinence   . Otosclerosis   . Bipolar 1 disorder   . OCD (obsessive compulsive disorder)   . Sarcoidosis   . Paroxysmal a-fib   . Dysrhythmia     "irregular" (11/15/2012)  . Type II diabetes mellitus    . Seizures   . Headache(784.0)     "severe; get them often" (11/15/2012)  . Subarachnoid hemorrhage 01/2012    with subdural  hematoma.   . Atrial fibrillation 01/2012    with RVR  . Conversion disorder 06/2010  . History of narcotic addiction   . Asthma   . Cancer   . Collagen vascular disease   . Renal insufficiency    Past Surgical History  Procedure Laterality Date  . Knee arthroscopy w/ meniscal repair Left 2009  . Lumbar disc surgery    . Squamous papilloma   2010    removed by Dr. Constance Holster ENT, noted on tongue  . Esophagogastroduodenoscopy N/A 09/27/2012    Procedure: ESOPHAGOGASTRODUODENOSCOPY (EGD);  Surgeon: Lafayette Dragon, MD;  Location: [facility];  Service: Endoscopy;  Laterality: N/A;  . Colonoscopy N/A 09/27/2012    Procedure: COLONOSCOPY;  Surgeon: Lafayette Dragon, MD;  Location: [facility];  Service: Endoscopy;  Laterality: N/A;  . Cataract extraction w/ intraocular lens implant Left   . Lymph node dissection Right 1970's    "neck; dr thought I had Hodgkins; turned out to be sarcoidosis" (11/15/2012)  . Tonsillectomy    . Carpal tunnel release Bilateral   . Cardiac catheterization    .  Back surgery     Family History  Problem Relation Age of Onset  . Alcohol abuse Mother   . Depression Mother   . Heart disease Mother   . Diabetes Mother   . Stroke Mother   . Diabetes Other     1/2 brother  . Hepatitis Brother   . Coronary artery disease Father    History  Substance Use Topics  . Smoking status: Current Every Day Smoker -- 30 years    Types: Cigarettes  . Smokeless tobacco: Never Used     Comment: 3 cigs a day  . Alcohol Use: No     Comment: Alcohol stopped in September of 2014    Review of Systems  Constitutional: Negative for fever and fatigue.  Respiratory: Negative for shortness of breath.   Cardiovascular: Positive for leg swelling.  Skin: Negative for rash and wound.  Neurological: Positive for numbness.  All other systems reviewed and are  negative.     Allergies  Immune globulins; Ciprofloxacin; Flagyl; Lisinopril; Sulfa antibiotics; Betamethasone dipropionate; Bupropion hcl; Clobetasol; Codeine; Escitalopram oxalate; Fluoxetine hcl; Fluticasone-salmeterol; Furosemide; Paroxetine; Penicillins; Tacrolimus; Tetanus toxoid; and Tuberculin purified protein derivative  Home Medications   Prior to Admission medications   Medication Sig Start Date End Date Taking? Authorizing Provider  albuterol-ipratropium (COMBIVENT) 18-103 MCG/ACT inhaler Inhale 1 puff into the lungs daily. Patient taking differently: Inhale 1 puff into the lungs every 6 (six) hours as needed for wheezing or shortness of breath.  06/20/13  Yes Gerda Diss, MD  ALPRAZolam Duanne Moron) 1 MG tablet Take 1 mg by mouth at bedtime as needed for anxiety.   Yes Historical Provider, MD  amiodarone (PACERONE) 200 MG tablet Take 1 tablet (200 mg total) by mouth daily. 07/30/14  Yes Renee A Kuneff, DO  aspirin EC 81 MG tablet Take 81 mg by mouth daily.   Yes Historical Provider, MD  atorvastatin (LIPITOR) 40 MG tablet Take 1 tablet (40 mg total) by mouth daily. 07/30/14  Yes Renee A Kuneff, DO  divalproex (DEPAKOTE ER) 500 MG 24 hr tablet Take 1 tablet (500 mg total) by mouth daily. 07/30/14  Yes Renee A Kuneff, DO  levETIRAcetam (KEPPRA) 500 MG tablet TAKE 1 TABLET(500 MG) BY MOUTH TWICE DAILY 07/31/14  Yes Renee A Kuneff, DO  loperamide (IMODIUM) 2 MG capsule 1-2 tabs TID PRN for diarrhea. 08/14/14  Yes Renee A Kuneff, DO  Multiple Vitamin (MULTIVITAMIN WITH MINERALS) TABS tablet Take 1 tablet by mouth daily. 12/29/12  Yes Kandis Nab, MD  NEXIUM 40 MG capsule TAKE ONE CAPSULE BY MOUTH EVERY MORNING WITH BREAKFAST 03/07/14  Yes Renee A Kuneff, DO  ondansetron (ZOFRAN) 4 MG tablet Take 4 mg by mouth every 8 (eight) hours as needed for nausea or vomiting.   Yes Historical Provider, MD  oxybutynin (DITROPAN) 5 MG tablet Take 1 tablet (5 mg total) by mouth 3 (three) times daily.  07/30/14  Yes Renee A Kuneff, DO  potassium chloride SA (K-DUR,KLOR-CON) 20 MEQ tablet TAKE 1 TABLET(20 MEQ) BY MOUTH DAILY 07/31/14  Yes Renee A Kuneff, DO  rOPINIRole (REQUIP) 2 MG tablet take 0.5 tablet by mouth daily. 06/28/14  Yes Historical Provider, MD   BP 161/95 mmHg  Pulse 60  Resp 18  SpO2 96% Physical Exam  Constitutional: He is oriented to person, place, and time. He appears well-developed and well-nourished.  HENT:  Head: Normocephalic.  Eyes: Pupils are equal, round, and reactive to light.  Neck: Normal range of motion.  Cardiovascular: Normal rate and regular rhythm.   Pulmonary/Chest: Effort normal and breath sounds normal.  Musculoskeletal: He exhibits edema and tenderness.       Left ankle: He exhibits swelling. He exhibits no deformity and normal pulse. Tenderness.       Left lower leg: He exhibits tenderness and edema.  Neurological: He is alert and oriented to person, place, and time.  Skin: Skin is warm. No erythema.  Nursing note and vitals reviewed.   ED Course  Procedures (including critical care time) Labs Review Labs Reviewed  CBC WITH DIFFERENTIAL/PLATELET - Abnormal; Notable for the following:    RDW 15.7 (*)    All other components within normal limits  D-DIMER, QUANTITATIVE (NOT AT [facility]) - Abnormal; Notable for the following:    D-Dimer, Quant 0.62 (*)    All other components within normal limits  I-STAT CHEM 8, ED  I-STAT TROPOININ, ED    Imaging Review No results found.   EKG Interpretation None      MDM   Final diagnoses:  Foot swelling          Junius Creamer, NP 09/10/14 [id number]  Linton Flemings, MD 09/11/14 [id number]

## 2014-09-09 NOTE — ED Notes (Signed)
Per EMS: Pt complaining of recent swelling and pain in R leg. Complaining of tingling down leg. Hx. MI, clots. BP = 162/70.

## 2014-09-10 ENCOUNTER — Emergency Department (HOSPITAL_BASED_OUTPATIENT_CLINIC_OR_DEPARTMENT_OTHER): Payer: Commercial Managed Care - HMO

## 2014-09-10 DIAGNOSIS — M7989 Other specified soft tissue disorders: Secondary | ICD-10-CM | POA: Diagnosis not present

## 2014-09-10 LAB — I-STAT CHEM 8, ED
BUN: 10 mg/dL (ref 6–20)
Calcium, Ion: 1.14 mmol/L (ref 1.13–1.30)
Chloride: 102 mmol/L (ref 101–111)
Creatinine, Ser: 0.9 mg/dL (ref 0.61–1.24)
GLUCOSE: 83 mg/dL (ref 65–99)
HEMATOCRIT: 44 % (ref 39.0–52.0)
Hemoglobin: 15 g/dL (ref 13.0–17.0)
POTASSIUM: 3.6 mmol/L (ref 3.5–5.1)
SODIUM: 141 mmol/L (ref 135–145)
TCO2: 24 mmol/L (ref 0–100)

## 2014-09-10 LAB — I-STAT TROPONIN, ED: Troponin i, poc: 0 ng/mL (ref 0.00–0.08)

## 2014-09-10 LAB — D-DIMER, QUANTITATIVE (NOT AT ARMC): D DIMER QUANT: 0.62 ug{FEU}/mL — AB (ref 0.00–0.48)

## 2014-09-10 NOTE — ED Notes (Signed)
Pt would like an update on his plan of care. This RN told him that all of his results are back, and that this RN was waiting for the provider to update him, or place more orders.

## 2014-09-10 NOTE — Discharge Instructions (Signed)
You were evaluated in the ED today for the swelling in your foot. There is no evidence of DVT or other vascular occlusion. It is important to follow up with your doctors for further evaluation and management of your symptoms. Return to ED for worsening symptoms.

## 2014-09-10 NOTE — ED Notes (Signed)
Pt placed in a hospital bed for his comfort

## 2014-09-10 NOTE — ED Provider Notes (Signed)
Patient care signed out to me by Junius Creamer, NP. Plan is for ultrasound Doppler of right leg at 7 AM to rule out DVT. If negative, patient may be discharged home to follow up with primary care. If positive the patient will need admission to family medicine service for evaluation of anticoagulation.  Ultrasound study is negative for DVT. Discussed follow-up with primary care, patient agrees with this plan. Patient stable, in good condition and is appropriate for discharge to follow-up with PCP. Meds given in ED:  Medications - No data to display  Discharge Medication List as of 09/10/2014  9:39 AM     Filed Vitals:   09/10/14 0727 09/10/14 0800 09/10/14 0804 09/10/14 0941  BP:  144/82 144/82 161/95  Pulse: 56 67 70 60  Resp: 16  18 18   SpO2: 94% 95% 94% 96%     Comer Locket, PA-C 09/10/14 [place], MD 09/10/14 1724

## 2014-09-10 NOTE — ED Notes (Signed)
Charge made aware patient in the room waiting on PTAR.

## 2014-09-10 NOTE — Progress Notes (Signed)
Left lower extremity venous duplex completed.  Left:  No evidence of DVT, superficial thrombosis, or Baker's cyst.  Right:  Negative for DVT in the common femoral vein.  

## 2014-09-23 ENCOUNTER — Ambulatory Visit (INDEPENDENT_AMBULATORY_CARE_PROVIDER_SITE_OTHER): Payer: Commercial Managed Care - HMO | Admitting: Family Medicine

## 2014-09-23 ENCOUNTER — Encounter: Payer: Self-pay | Admitting: Family Medicine

## 2014-09-23 VITALS — BP 153/75 | HR 50 | Temp 98.5°F | Ht 65.0 in | Wt 205.0 lb

## 2014-09-23 DIAGNOSIS — E119 Type 2 diabetes mellitus without complications: Secondary | ICD-10-CM | POA: Diagnosis not present

## 2014-09-23 DIAGNOSIS — F17200 Nicotine dependence, unspecified, uncomplicated: Secondary | ICD-10-CM

## 2014-09-23 DIAGNOSIS — I10 Essential (primary) hypertension: Secondary | ICD-10-CM

## 2014-09-23 DIAGNOSIS — Z0000 Encounter for general adult medical examination without abnormal findings: Secondary | ICD-10-CM | POA: Diagnosis not present

## 2014-09-23 DIAGNOSIS — Z720 Tobacco use: Secondary | ICD-10-CM | POA: Diagnosis not present

## 2014-09-23 DIAGNOSIS — I872 Venous insufficiency (chronic) (peripheral): Secondary | ICD-10-CM | POA: Insufficient documentation

## 2014-09-23 NOTE — Assessment & Plan Note (Signed)
Near goal with widening pulse pressure. No changes to medications due to orthostatic symptoms.

## 2014-09-23 NOTE — Progress Notes (Signed)
Subjective: Patrick Holt is a 64 y.o. male presenting for multiple complaints.  Diabetes shoes: Desires new diabetic shoes. Takes no medications for diabetes, does not check his blood sugar. Denies polyuria, polyphagia, unintentional weight loss.   Dizziness: Reports feeling light headed mostly when he stands from a seated position and worst when he hasn't eaten in the preceding few hours.   Leg swelling: Had evaluation of left leg swelling in ED recently including DVT r/o, but states his right leg now swells symmetrically. Both resolve with elevation and worsen with walking. Onset was gradual, painless. No SOB or chest pain.   - STOPPED Smoking when he moved out of his apartment into the retirement home which he reports is very nice.   Objective: BP 153/75 mmHg  Pulse 50  Temp(Src) 98.5 F (36.9 C) (Oral)  Ht 5\' 5"  (1.651 m)  Wt 205 lb (92.987 kg)  BMI 34.11 kg/m2 Gen: 64 y.o. male in no distress CV: Bradycardic rate (near 60), no murmur; radial, DP and PT pulses 1+ bilaterally; no JVD, cap refill < 2 sec. Pulm: Non-labored breathing ambient air; distant clear sounds, no wheezes or crackles Ext: Bilateral 2+ LE edema to upper calf without pain. No varicosities, negative homans sign.   Assessment/Plan: Patrick Holt is a 64 y.o. male here for multiple complaints.  See problem list for plan.

## 2014-09-23 NOTE — Assessment & Plan Note (Signed)
Recommended leg elevation and compression stockings, though he refused the latter at this time.

## 2014-09-30 ENCOUNTER — Other Ambulatory Visit: Payer: Self-pay | Admitting: Family Medicine

## 2014-09-30 ENCOUNTER — Telehealth: Payer: Self-pay | Admitting: Family Medicine

## 2014-09-30 MED ORDER — MECLIZINE HCL 32 MG PO TABS: 32.0000 mg | ORAL_TABLET | Freq: Three times a day (TID) | ORAL | Status: DC | PRN

## 2014-09-30 MED ORDER — MECLIZINE HCL 25 MG PO TABS: 25.0000 mg | ORAL_TABLET | Freq: Three times a day (TID) | ORAL | Status: DC | PRN

## 2014-09-30 NOTE — Telephone Encounter (Signed)
30 tabs for therapeutic trial of meclizine TID prn sent to that pharmacy. Thank you.

## 2014-09-30 NOTE — Telephone Encounter (Signed)
Pt last seen by PCP on 09/23/14 and pt declined medication at that time for his diagnosed vertigo. Pt now explains that it is getting worse and is requesting the prescribed medication discussed at last visit to be sent to [facility] on [address]. Sadie Reynolds, ASA

## 2014-10-10 ENCOUNTER — Other Ambulatory Visit: Payer: Self-pay | Admitting: Family Medicine

## 2014-10-10 NOTE — Telephone Encounter (Signed)
Can you please have him schedule an appointment for him to be seen for vertigo?

## 2014-10-10 NOTE — Telephone Encounter (Signed)
Says the meclazine for vertigo is not doing any good. Is there something else dr could prescribe? Also needs refill of oxybuton. [facility] [address]

## 2014-10-11 ENCOUNTER — Other Ambulatory Visit: Payer: Self-pay | Admitting: Family Medicine

## 2014-10-11 MED ORDER — LOPERAMIDE HCL 2 MG PO CAPS: ORAL_CAPSULE | ORAL | Status: DC

## 2014-10-11 MED ORDER — OXYBUTYNIN CHLORIDE 5 MG PO TABS: 5.0000 mg | ORAL_TABLET | Freq: Three times a day (TID) | ORAL | Status: DC

## 2014-10-11 NOTE — Telephone Encounter (Signed)
Spoke with patient and tried to schedule him with pcp but there were no appts until October.  Patient declined to see another provider.  He did state that he will try this medication again if you can refill it and also needs a refill on his imodium.  Jazmin Hartsell,CMA

## 2014-10-14 ENCOUNTER — Ambulatory Visit (HOSPITAL_COMMUNITY): Payer: Self-pay | Admitting: Psychiatry

## 2014-10-22 ENCOUNTER — Emergency Department (HOSPITAL_COMMUNITY): Payer: Commercial Managed Care - HMO

## 2014-10-22 ENCOUNTER — Emergency Department (HOSPITAL_COMMUNITY)
Admission: EM | Admit: 2014-10-22 | Discharge: 2014-10-22 | Disposition: A | Discharge status: Home / Self Care | Payer: Commercial Managed Care - HMO | Attending: Emergency Medicine | Admitting: Emergency Medicine

## 2014-10-22 ENCOUNTER — Encounter (HOSPITAL_COMMUNITY): Payer: Self-pay | Admitting: General Practice

## 2014-10-22 DIAGNOSIS — I252 Old myocardial infarction: Secondary | ICD-10-CM | POA: Diagnosis not present

## 2014-10-22 DIAGNOSIS — M1990 Unspecified osteoarthritis, unspecified site: Secondary | ICD-10-CM | POA: Insufficient documentation

## 2014-10-22 DIAGNOSIS — E669 Obesity, unspecified: Secondary | ICD-10-CM | POA: Diagnosis not present

## 2014-10-22 DIAGNOSIS — Z87442 Personal history of urinary calculi: Secondary | ICD-10-CM | POA: Insufficient documentation

## 2014-10-22 DIAGNOSIS — Z79899 Other long term (current) drug therapy: Secondary | ICD-10-CM | POA: Diagnosis not present

## 2014-10-22 DIAGNOSIS — E119 Type 2 diabetes mellitus without complications: Secondary | ICD-10-CM | POA: Diagnosis not present

## 2014-10-22 DIAGNOSIS — J449 Chronic obstructive pulmonary disease, unspecified: Secondary | ICD-10-CM | POA: Diagnosis not present

## 2014-10-22 DIAGNOSIS — G40909 Epilepsy, unspecified, not intractable, without status epilepticus: Secondary | ICD-10-CM | POA: Diagnosis not present

## 2014-10-22 DIAGNOSIS — Z86010 Personal history of colonic polyps: Secondary | ICD-10-CM | POA: Diagnosis not present

## 2014-10-22 DIAGNOSIS — R200 Anesthesia of skin: Secondary | ICD-10-CM | POA: Diagnosis not present

## 2014-10-22 DIAGNOSIS — Z880 Allergy status to penicillin: Secondary | ICD-10-CM | POA: Insufficient documentation

## 2014-10-22 DIAGNOSIS — Z87448 Personal history of other diseases of urinary system: Secondary | ICD-10-CM | POA: Diagnosis not present

## 2014-10-22 DIAGNOSIS — Z7982 Long term (current) use of aspirin: Secondary | ICD-10-CM | POA: Diagnosis not present

## 2014-10-22 DIAGNOSIS — I4891 Unspecified atrial fibrillation: Secondary | ICD-10-CM | POA: Diagnosis not present

## 2014-10-22 DIAGNOSIS — M069 Rheumatoid arthritis, unspecified: Secondary | ICD-10-CM | POA: Insufficient documentation

## 2014-10-22 DIAGNOSIS — R1310 Dysphagia, unspecified: Secondary | ICD-10-CM | POA: Diagnosis not present

## 2014-10-22 DIAGNOSIS — I10 Essential (primary) hypertension: Secondary | ICD-10-CM | POA: Insufficient documentation

## 2014-10-22 DIAGNOSIS — E785 Hyperlipidemia, unspecified: Secondary | ICD-10-CM | POA: Insufficient documentation

## 2014-10-22 DIAGNOSIS — Z87891 Personal history of nicotine dependence: Secondary | ICD-10-CM | POA: Diagnosis not present

## 2014-10-22 DIAGNOSIS — K219 Gastro-esophageal reflux disease without esophagitis: Secondary | ICD-10-CM | POA: Insufficient documentation

## 2014-10-22 DIAGNOSIS — Z853 Personal history of malignant neoplasm of breast: Secondary | ICD-10-CM | POA: Insufficient documentation

## 2014-10-22 DIAGNOSIS — F319 Bipolar disorder, unspecified: Secondary | ICD-10-CM | POA: Insufficient documentation

## 2014-10-22 DIAGNOSIS — Z862 Personal history of diseases of the blood and blood-forming organs and certain disorders involving the immune mechanism: Secondary | ICD-10-CM | POA: Diagnosis not present

## 2014-10-22 DIAGNOSIS — R682 Dry mouth, unspecified: Secondary | ICD-10-CM | POA: Insufficient documentation

## 2014-10-22 LAB — URINALYSIS, ROUTINE W REFLEX MICROSCOPIC
GLUCOSE, UA: NEGATIVE mg/dL
HGB URINE DIPSTICK: NEGATIVE
Ketones, ur: NEGATIVE mg/dL
Leukocytes, UA: NEGATIVE
Nitrite: NEGATIVE
PH: 6 (ref 5.0–8.0)
PROTEIN: NEGATIVE mg/dL
Specific Gravity, Urine: 1.028 (ref 1.005–1.030)
Urobilinogen, UA: 1 mg/dL (ref 0.0–1.0)

## 2014-10-22 LAB — CBC WITH DIFFERENTIAL/PLATELET
Basophils Absolute: 0 10*3/uL (ref 0.0–0.1)
Basophils Relative: 0 % (ref 0–1)
EOS ABS: 0.1 10*3/uL (ref 0.0–0.7)
EOS PCT: 2 % (ref 0–5)
HCT: 45.3 % (ref 39.0–52.0)
Hemoglobin: 14.7 g/dL (ref 13.0–17.0)
LYMPHS ABS: 2.1 10*3/uL (ref 0.7–4.0)
Lymphocytes Relative: 31 % (ref 12–46)
MCH: 29.2 pg (ref 26.0–34.0)
MCHC: 32.5 g/dL (ref 30.0–36.0)
MCV: 90.1 fL (ref 78.0–100.0)
MONOS PCT: 10 % (ref 3–12)
Monocytes Absolute: 0.7 10*3/uL (ref 0.1–1.0)
Neutro Abs: 3.7 10*3/uL (ref 1.7–7.7)
Neutrophils Relative %: 57 % (ref 43–77)
PLATELETS: 286 10*3/uL (ref 150–400)
RBC: 5.03 MIL/uL (ref 4.22–5.81)
RDW: 15.7 % — ABNORMAL HIGH (ref 11.5–15.5)
WBC: 6.6 10*3/uL (ref 4.0–10.5)

## 2014-10-22 LAB — BASIC METABOLIC PANEL
Anion gap: 6 (ref 5–15)
BUN: 13 mg/dL (ref 6–20)
CHLORIDE: 106 mmol/L (ref 101–111)
CO2: 29 mmol/L (ref 22–32)
Calcium: 9 mg/dL (ref 8.9–10.3)
Creatinine, Ser: 1.1 mg/dL (ref 0.61–1.24)
GFR calc Af Amer: >60 mL/min (ref >60)
GFR calc non Af Amer: >60 mL/min (ref >60)
Glucose, Bld: 85 mg/dL (ref 65–99)
Potassium: 3.9 mmol/L (ref 3.5–5.1)
SODIUM: 141 mmol/L (ref 135–145)

## 2014-10-22 MED ORDER — LORAZEPAM 2 MG/ML IJ SOLN
1.0000 mg | Freq: Once | INTRAMUSCULAR | Status: AC
  Administered 2014-10-22: 1 mg via INTRAVENOUS
  Filled 2014-10-22: qty 1

## 2014-10-22 NOTE — ED Notes (Signed)
MRI contacted that the pt is unable to tolerate the scan, will notify Little, MD

## 2014-10-22 NOTE — ED Notes (Signed)
Pt brought in via [facility] with complaints of mouth and left leg numbness X's 1 week. Pt is A/O, and neurologically intact. Pt also reporting some difficulty swallowing with his mouth numbness. EMS reporting during transit pt would be in NSR rate controlled and his HR would go up to the 130's for a few seconds. Episodes of tachycardia only lasted a few seconds- per EMS. Pt is asymptomatic during episodes.

## 2014-10-22 NOTE — ED Provider Notes (Signed)
CSN: [id number]     Arrival date & time 10/22/14  1547 History   First MD Initiated Contact with Patient 10/22/14 1612     Chief Complaint  Patient presents with  . Numbness     (Consider location/radiation/quality/duration/timing/severity/associated sxs/prior Treatment) HPI Comments: 64 year old male with extensive past medical history including hypertension, ischemic heart disease, peripheral neuropathy, esophageal strictures, atrial fibrillation on rate control, rheumatoid arthritis who presents with mouth numbness. The patient states that for the past week, he has noticed numbness of his entire mouth including tongue, gums, and lips. He feels like he is having difficulty swallowing with food being stuck at the very top of his mouth/throat. He also notes some bilateral foot numbness which she has had chronically. He denies any extremity weakness. No chest pain or shortness of breath. No headache. No neck stiffness. No fevers. He was picked up by EMS who noted a few seconds of tachycardia several times during transport.  The history is provided by the patient.    Past Medical History  Diagnosis Date  . Rheumatoid arthritis(714.0)   . Obesity   . Major depression     with acute psychotic break in 06/2010  . Hypertension   . Hyperlipidemia   . Diverticulosis of colon   . COPD (chronic obstructive pulmonary disease)   . Anxiety   . GERD (gastroesophageal reflux disease)   . Vertigo   . Fibromyalgia   . Dermatomyositis   . Myocardial infarct     mulitple (1999, 2000, 2004)  . Raynaud's disease   . Narcotic dependence   . Peripheral neuropathy   . Internal hemorrhoids   . Ischemic heart disease   . Hiatal hernia   . Gastritis   . Diverticulitis   . Hx of adenomatous colonic polyps   . Nephrolithiasis   . Anemia   . Esophageal stricture   . Esophageal dysmotility   . Dermatomyositis   . Urge incontinence   . Otosclerosis   . Bipolar 1 disorder   . OCD (obsessive compulsive  disorder)   . Sarcoidosis   . Paroxysmal a-fib   . Dysrhythmia     "irregular" (11/15/2012)  . Type II diabetes mellitus   . Seizures   . Headache(784.0)     "severe; get them often" (11/15/2012)  . Subarachnoid hemorrhage 01/2012    with subdural  hematoma.   . Atrial fibrillation 01/2012    with RVR  . Conversion disorder 06/2010  . History of narcotic addiction   . Asthma   . Cancer   . Collagen vascular disease   . Renal insufficiency    Past Surgical History  Procedure Laterality Date  . Knee arthroscopy w/ meniscal repair Left 2009  . Lumbar disc surgery    . Squamous papilloma   2010    removed by Dr. Constance Holster ENT, noted on tongue  . Esophagogastroduodenoscopy N/A 09/27/2012    Procedure: ESOPHAGOGASTRODUODENOSCOPY (EGD);  Surgeon: Lafayette Dragon, MD;  Location: [facility];  Service: Endoscopy;  Laterality: N/A;  . Colonoscopy N/A 09/27/2012    Procedure: COLONOSCOPY;  Surgeon: Lafayette Dragon, MD;  Location: [facility];  Service: Endoscopy;  Laterality: N/A;  . Cataract extraction w/ intraocular lens implant Left   . Lymph node dissection Right 1970's    "neck; dr thought I had Hodgkins; turned out to be sarcoidosis" (11/15/2012)  . Tonsillectomy    . Carpal tunnel release Bilateral   . Cardiac catheterization    . Back surgery  Family History  Problem Relation Age of Onset  . Alcohol abuse Mother   . Depression Mother   . Heart disease Mother   . Diabetes Mother   . Stroke Mother   . Diabetes Other     1/2 brother  . Hepatitis Brother   . Coronary artery disease Father    Social History  Substance Use Topics  . Smoking status: Former Smoker -- 30 years    Types: Cigarettes  . Smokeless tobacco: Never Used     Comment: 3 cigs a day  . Alcohol Use: No     Comment: Alcohol stopped in September of 2014    Review of Systems  10 Systems reviewed and are negative for acute change except as noted in the HPI.   Allergies  Immune globulins; Ciprofloxacin;  Flagyl; Lisinopril; Sulfa antibiotics; Betamethasone dipropionate; Bupropion hcl; Clobetasol; Codeine; Escitalopram oxalate; Fluoxetine hcl; Fluticasone-salmeterol; Furosemide; Paroxetine; Penicillins; Tacrolimus; Tetanus toxoid; and Tuberculin purified protein derivative  Home Medications   Prior to Admission medications   Medication Sig Start Date End Date Taking? Authorizing Provider  amiodarone (PACERONE) 200 MG tablet Take 1 tablet (200 mg total) by mouth daily. 07/30/14  Yes Renee A Kuneff, DO  aspirin EC 81 MG tablet Take 81 mg by mouth daily.   Yes Historical Provider, MD  loperamide (IMODIUM) 2 MG capsule 1-2 tabs TID PRN for diarrhea. Patient taking differently: Take 2 mg by mouth as needed for diarrhea or loose stools.  10/11/14  Yes Patrecia Pour, MD  NEXIUM 40 MG capsule TAKE ONE CAPSULE BY MOUTH EVERY MORNING WITH BREAKFAST 03/07/14  Yes Renee A Kuneff, DO  oxybutynin (DITROPAN) 5 MG tablet Take 1 tablet (5 mg total) by mouth 3 (three) times daily. 10/11/14  Yes Patrecia Pour, MD  potassium chloride SA (K-DUR,KLOR-CON) 20 MEQ tablet TAKE 1 TABLET(20 MEQ) BY MOUTH DAILY 07/31/14  Yes Renee A Kuneff, DO  albuterol-ipratropium (COMBIVENT) 18-103 MCG/ACT inhaler Inhale 1 puff into the lungs daily. Patient taking differently: Inhale 1 puff into the lungs every 6 (six) hours as needed for wheezing or shortness of breath.  06/20/13   Gerda Diss, MD  atorvastatin (LIPITOR) 40 MG tablet Take 1 tablet (40 mg total) by mouth daily. 07/30/14   Renee A Kuneff, DO  divalproex (DEPAKOTE ER) 500 MG 24 hr tablet Take 1 tablet (500 mg total) by mouth daily. 07/30/14   Renee A Kuneff, DO  levETIRAcetam (KEPPRA) 500 MG tablet TAKE 1 TABLET(500 MG) BY MOUTH TWICE DAILY 07/31/14   Renee A Kuneff, DO  meclizine (ANTIVERT) 25 MG tablet Take 1 tablet (25 mg total) by mouth 3 (three) times daily as needed. 09/30/14   Patrecia Pour, MD  Multiple Vitamin (MULTIVITAMIN WITH MINERALS) TABS tablet Take 1 tablet by mouth  daily. 12/29/12   Kandis Nab, MD  ondansetron (ZOFRAN) 4 MG tablet Take 4 mg by mouth every 8 (eight) hours as needed for nausea or vomiting.    Historical Provider, MD  rOPINIRole (REQUIP) 2 MG tablet take 0.5 tablet by mouth daily. 06/28/14   Historical Provider, MD   BP 164/91 mmHg  Pulse 64  Temp(Src) 99.1 F (37.3 C) (Oral)  Resp 16  Ht 5\' 10"  (1.778 m)  Wt 200 lb (90.719 kg)  BMI 28.70 kg/m2  SpO2 97% Physical Exam  Constitutional: He is oriented to person, place, and time. He appears well-developed and well-nourished. No distress.  HENT:  Head: Normocephalic and atraumatic.  Moist mucous membranes  Eyes: Conjunctivae and EOM are normal. Pupils are equal, round, and reactive to light.  Neck: Neck supple.  Cardiovascular: Normal rate, normal heart sounds and intact distal pulses.   No murmur heard. Irregularly irregular rhythm  Pulmonary/Chest: Effort normal and breath sounds normal.  Abdominal: Soft. Bowel sounds are normal. He exhibits no distension. There is no tenderness.  Musculoskeletal: He exhibits no edema.  Neurological: He is alert and oriented to person, place, and time. He displays normal reflexes. No cranial nerve deficit. He exhibits normal muscle tone.  Fluent speech; normal strength and sensation throughout, no facial asymmetry  Skin: Skin is warm and dry.  Psychiatric: He has a normal mood and affect. Judgment normal.  Nursing note and vitals reviewed.   ED Course  Procedures (including critical care time) Labs Review Labs Reviewed  CBC WITH DIFFERENTIAL/PLATELET - Abnormal; Notable for the following:    RDW 15.7 (*)    All other components within normal limits  URINALYSIS, ROUTINE W REFLEX MICROSCOPIC (NOT AT [facility]) - Abnormal; Notable for the following:    Color, Urine AMBER (*)    Bilirubin Urine SMALL (*)    All other components within normal limits  BASIC METABOLIC PANEL    Imaging Review Mr Brain Wo Contrast (neuro Protocol)  10/22/2014    CLINICAL DATA:  Patient complains of mouth and LEFT leg numbness for 1 week. Positional vertigo.  EXAM: MRI HEAD WITHOUT CONTRAST  TECHNIQUE: Multiplanar, multiecho pulse sequences of the brain and surrounding structures were obtained without intravenous contrast.  COMPARISON:  CT head 07/08/2014.  MR head 01/04/14.  FINDINGS: No evidence for acute infarction, hemorrhage, mass lesion, hydrocephalus, or extra-axial fluid. Generalized atrophy. Extensive small vessel disease. Scattered chronic lacunar infarcts throughout the deep nuclei, and deep white matter, stable. Flow voids are maintained. No chronic hemorrhage. No midline abnormality. LEFT cataract extraction, otherwise unremarkable extracranial soft tissues.  IMPRESSION: Chronic changes as described, similar to prior CT and MR.  No acute intracranial findings.   Electronically Signed   By: Staci Righter M.D.   On: 10/22/2014 20:11   I have personally reviewed and evaluated these lab results as part of my medical decision-making.   EKG Interpretation None      MDM   Final diagnoses:  None   mouth numbness Dry mouth Dysphagia   64 year old male with multiple medical problems who presents with 1 week of mouth numbness and complaints of difficulty swallowing. Patient also complains of severe dry mouth. At presentation, patient awake and well-appearing. No neurologic deficits on exam. Vital signs notable for mild hypertension. Obtained above lab work and given patient's significant comorbidities and risk for stroke, obtained an MR of the brain to evaluate for CVA versus intracranial bleed.  Labwork was unremarkable and shows no evidence of dehydration. MRI shows chronic changes but no acute findings to explain the patient's symptoms. On reexamination, the patient remains well-appearing with no other neurologic complaints. He does state that he has been on meclizine recently for vertigo although he ran out a week ago. It is possible that his  symptoms of dry mouth are due to medication side effect. Regarding his complaints of difficulty swallowing, he is tolerating by mouth without difficulty here in the emergency department. I have recommended that he follow-up with his PCP or gastroenterologist for a swallow study if his symptoms continue. Return precautions reviewed. Patient discharged in satisfactory condition.    Sharlett Iles, MD 10/22/14 2049

## 2014-10-24 ENCOUNTER — Emergency Department (HOSPITAL_COMMUNITY): Payer: Commercial Managed Care - HMO

## 2014-10-24 ENCOUNTER — Encounter (HOSPITAL_COMMUNITY): Payer: Self-pay | Admitting: *Deleted

## 2014-10-24 DIAGNOSIS — G629 Polyneuropathy, unspecified: Secondary | ICD-10-CM | POA: Diagnosis not present

## 2014-10-24 DIAGNOSIS — E669 Obesity, unspecified: Secondary | ICD-10-CM | POA: Insufficient documentation

## 2014-10-24 DIAGNOSIS — Z87442 Personal history of urinary calculi: Secondary | ICD-10-CM | POA: Insufficient documentation

## 2014-10-24 DIAGNOSIS — F419 Anxiety disorder, unspecified: Secondary | ICD-10-CM | POA: Insufficient documentation

## 2014-10-24 DIAGNOSIS — Z86010 Personal history of colonic polyps: Secondary | ICD-10-CM | POA: Diagnosis not present

## 2014-10-24 DIAGNOSIS — Z79899 Other long term (current) drug therapy: Secondary | ICD-10-CM | POA: Diagnosis not present

## 2014-10-24 DIAGNOSIS — Z862 Personal history of diseases of the blood and blood-forming organs and certain disorders involving the immune mechanism: Secondary | ICD-10-CM | POA: Diagnosis not present

## 2014-10-24 DIAGNOSIS — Z87448 Personal history of other diseases of urinary system: Secondary | ICD-10-CM | POA: Insufficient documentation

## 2014-10-24 DIAGNOSIS — Z880 Allergy status to penicillin: Secondary | ICD-10-CM | POA: Diagnosis not present

## 2014-10-24 DIAGNOSIS — R079 Chest pain, unspecified: Secondary | ICD-10-CM | POA: Insufficient documentation

## 2014-10-24 DIAGNOSIS — Z8669 Personal history of other diseases of the nervous system and sense organs: Secondary | ICD-10-CM | POA: Diagnosis not present

## 2014-10-24 DIAGNOSIS — E785 Hyperlipidemia, unspecified: Secondary | ICD-10-CM | POA: Diagnosis not present

## 2014-10-24 DIAGNOSIS — E119 Type 2 diabetes mellitus without complications: Secondary | ICD-10-CM | POA: Insufficient documentation

## 2014-10-24 DIAGNOSIS — F319 Bipolar disorder, unspecified: Secondary | ICD-10-CM | POA: Insufficient documentation

## 2014-10-24 DIAGNOSIS — I252 Old myocardial infarction: Secondary | ICD-10-CM | POA: Diagnosis not present

## 2014-10-24 DIAGNOSIS — J449 Chronic obstructive pulmonary disease, unspecified: Secondary | ICD-10-CM | POA: Diagnosis not present

## 2014-10-24 DIAGNOSIS — Z7982 Long term (current) use of aspirin: Secondary | ICD-10-CM | POA: Insufficient documentation

## 2014-10-24 DIAGNOSIS — Z87891 Personal history of nicotine dependence: Secondary | ICD-10-CM | POA: Insufficient documentation

## 2014-10-24 DIAGNOSIS — Z8719 Personal history of other diseases of the digestive system: Secondary | ICD-10-CM | POA: Diagnosis not present

## 2014-10-24 DIAGNOSIS — I4891 Unspecified atrial fibrillation: Secondary | ICD-10-CM | POA: Insufficient documentation

## 2014-10-24 DIAGNOSIS — F42 Obsessive-compulsive disorder: Secondary | ICD-10-CM | POA: Insufficient documentation

## 2014-10-24 LAB — BASIC METABOLIC PANEL
ANION GAP: 7 (ref 5–15)
BUN: 18 mg/dL (ref 6–20)
CALCIUM: 9 mg/dL (ref 8.9–10.3)
CO2: 28 mmol/L (ref 22–32)
Chloride: 105 mmol/L (ref 101–111)
Creatinine, Ser: 1.1 mg/dL (ref 0.61–1.24)
Glucose, Bld: 93 mg/dL (ref 65–99)
Potassium: 4.4 mmol/L (ref 3.5–5.1)
Sodium: 140 mmol/L (ref 135–145)

## 2014-10-24 LAB — CBC
HCT: 46.1 % (ref 39.0–52.0)
HEMOGLOBIN: 15 g/dL (ref 13.0–17.0)
MCH: 29.8 pg (ref 26.0–34.0)
MCHC: 32.5 g/dL (ref 30.0–36.0)
MCV: 91.5 fL (ref 78.0–100.0)
Platelets: 272 10*3/uL (ref 150–400)
RBC: 5.04 MIL/uL (ref 4.22–5.81)
RDW: 16 % — ABNORMAL HIGH (ref 11.5–15.5)
WBC: 7.7 10*3/uL (ref 4.0–10.5)

## 2014-10-24 LAB — I-STAT TROPONIN, ED: TROPONIN I, POC: 0.01 ng/mL (ref 0.00–0.08)

## 2014-10-24 NOTE — ED Notes (Signed)
Pt c/o CP that started today that radiates to back and jaw. States that he was here Wednesday with what was thought to be a stoke but wasn't. Also reports recent hx PNA.

## 2014-10-25 ENCOUNTER — Emergency Department (HOSPITAL_COMMUNITY)
Admission: EM | Admit: 2014-10-25 | Discharge: 2014-10-25 | Disposition: A | Discharge status: Home / Self Care | Payer: Commercial Managed Care - HMO | Attending: Emergency Medicine | Admitting: Emergency Medicine

## 2014-10-25 ENCOUNTER — Inpatient Hospital Stay: Payer: Self-pay | Admitting: Family Medicine

## 2014-10-25 DIAGNOSIS — R079 Chest pain, unspecified: Secondary | ICD-10-CM

## 2014-10-25 LAB — TROPONIN I: Troponin I: <0.03 ng/mL (ref <0.031)

## 2014-10-25 MED ORDER — ACETAMINOPHEN 500 MG PO TABS
1000.0000 mg | ORAL_TABLET | Freq: Once | ORAL | Status: AC
  Administered 2014-10-25: 1000 mg via ORAL
  Filled 2014-10-25: qty 2

## 2014-10-25 NOTE — ED Notes (Signed)
Pt sts he does not ambulate independently without his walker and cane, which are at home.  Pt able to stand to pull his pants up during his discharge.

## 2014-10-25 NOTE — ED Notes (Signed)
Called [facility] @ [phone]

## 2014-10-25 NOTE — Discharge Instructions (Signed)
Chest Pain (Nonspecific) [name], the workup for your heart today was normal.  See your primary care doctor today at 1:30pm during your appointment for continued care.  If symptoms worsen, come back to the ED immediately.  Thank you. It is often hard to give a diagnosis for the cause of chest pain. There is always a chance that your pain could be related to something serious, such as a heart attack or a blood clot in the lungs. You need to follow up with your doctor. HOME CARE  If antibiotic medicine was given, take it as directed by your doctor. Finish the medicine even if you start to feel better.  For the next few days, avoid activities that bring on chest pain. Continue physical activities as told by your doctor.  Do not use any tobacco products. This includes cigarettes, chewing tobacco, and e-cigarettes.  Avoid drinking alcohol.  Only take medicine as told by your doctor.  Follow your doctor's suggestions for more testing if your chest pain does not go away.  Keep all doctor visits you made. GET HELP IF:  Your chest pain does not go away, even after treatment.  You have a rash with blisters on your chest.  You have a fever. GET HELP RIGHT AWAY IF:   You have more pain or pain that spreads to your arm, neck, jaw, back, or belly (abdomen).  You have shortness of breath.  You cough more than usual or cough up blood.  You have very bad back or belly pain.  You feel sick to your stomach (nauseous) or throw up (vomit).  You have very bad weakness.  You pass out (faint).  You have chills. This is an emergency. Do not wait to see if the problems will go away. Call your local emergency services (911 in [place] Do not drive yourself to the hospital. MAKE SURE YOU:   Understand these instructions.  Will watch your condition.  Will get help right away if you are not doing well or get worse. Document Released: 07/21/2007 Document Revised: 02/06/2013 Document Reviewed:  07/21/2007 [facility] Patient Information 2015 [place], [place]. This information is not intended to replace advice given to you by your health care provider. Make sure you discuss any questions you have with your health care provider.

## 2014-10-25 NOTE — ED Provider Notes (Signed)
CSN: [id number]     Arrival date & time 10/24/14  1858 History   This chart was scribed for Everlene Balls, MD by Randa Evens, ED Scribe. This patient was seen in room B17C/B17C and the patient's care was started at 12:30 AM.    Chief Complaint  Patient presents with  . Chest Pain   Patient is a 64 y.o. male presenting with chest pain. The history is provided by the patient. No language interpreter was used.  Chest Pain Pain quality: sharp   Pain radiates to:  Mid back and L arm Pain radiates to the back: yes   Pain severity:  Mild Duration:  1 day Timing:  Constant Context: at rest   Relieved by:  None tried Associated symptoms: back pain and nausea   Associated symptoms: not vomiting    HPI Comments: Patrick Holt is a 64 y.o. male with PMHx listed below who presents to the Emergency Department complaining of sharp CP onset today. Pt states that his pain is radiating around to his back, left arm and jaw. Pt reports associated nausea. Pt states that that the pain is worse when sitting. Pt states that he does not ambulate at baseline. Pt doesn't report any alleviating factors. Pt also states that he feels as if his mouth is numb but states that he has been told that this is from the combination of medications he is taking. Pt denies vomiting or other related symptoms. Pt states that he has a follow up with his PCP tomorrow.    Past Medical History  Diagnosis Date  . Rheumatoid arthritis(714.0)   . Obesity   . Major depression     with acute psychotic break in 06/2010  . Hypertension   . Hyperlipidemia   . Diverticulosis of colon   . COPD (chronic obstructive pulmonary disease)   . Anxiety   . GERD (gastroesophageal reflux disease)   . Vertigo   . Fibromyalgia   . Dermatomyositis   . Myocardial infarct     mulitple (1999, 2000, 2004)  . Raynaud's disease   . Narcotic dependence   . Peripheral neuropathy   . Internal hemorrhoids   . Ischemic heart disease   . Hiatal  hernia   . Gastritis   . Diverticulitis   . Hx of adenomatous colonic polyps   . Nephrolithiasis   . Anemia   . Esophageal stricture   . Esophageal dysmotility   . Dermatomyositis   . Urge incontinence   . Otosclerosis   . Bipolar 1 disorder   . OCD (obsessive compulsive disorder)   . Sarcoidosis   . Paroxysmal a-fib   . Dysrhythmia     "irregular" (11/15/2012)  . Type II diabetes mellitus   . Seizures   . Headache(784.0)     "severe; get them often" (11/15/2012)  . Subarachnoid hemorrhage 01/2012    with subdural  hematoma.   . Atrial fibrillation 01/2012    with RVR  . Conversion disorder 06/2010  . History of narcotic addiction   . Asthma   . Cancer   . Collagen vascular disease   . Renal insufficiency    Past Surgical History  Procedure Laterality Date  . Knee arthroscopy w/ meniscal repair Left 2009  . Lumbar disc surgery    . Squamous papilloma   2010    removed by Dr. Constance Holster ENT, noted on tongue  . Esophagogastroduodenoscopy N/A 09/27/2012    Procedure: ESOPHAGOGASTRODUODENOSCOPY (EGD);  Surgeon: Lafayette Dragon, MD;  Location:  [facility];  Service: Endoscopy;  Laterality: N/A;  . Colonoscopy N/A 09/27/2012    Procedure: COLONOSCOPY;  Surgeon: Lafayette Dragon, MD;  Location: [facility];  Service: Endoscopy;  Laterality: N/A;  . Cataract extraction w/ intraocular lens implant Left   . Lymph node dissection Right 1970's    "neck; dr thought I had Hodgkins; turned out to be sarcoidosis" (11/15/2012)  . Tonsillectomy    . Carpal tunnel release Bilateral   . Cardiac catheterization    . Back surgery     Family History  Problem Relation Age of Onset  . Alcohol abuse Mother   . Depression Mother   . Heart disease Mother   . Diabetes Mother   . Stroke Mother   . Diabetes Other     1/2 brother  . Hepatitis Brother   . Coronary artery disease Father    Social History  Substance Use Topics  . Smoking status: Former Smoker -- 30 years    Types: Cigarettes  .  Smokeless tobacco: Never Used     Comment: 3 cigs a day  . Alcohol Use: No     Comment: Alcohol stopped in September of 2014    Review of Systems  Cardiovascular: Positive for chest pain.  Gastrointestinal: Positive for nausea. Negative for vomiting.  Musculoskeletal: Positive for back pain.  All other systems reviewed and are negative.     Allergies  Immune globulins; Ciprofloxacin; Flagyl; Lisinopril; Sulfa antibiotics; Betamethasone dipropionate; Bupropion hcl; Clobetasol; Codeine; Escitalopram oxalate; Fluoxetine hcl; Fluticasone-salmeterol; Furosemide; Paroxetine; Penicillins; Tacrolimus; Tetanus toxoid; and Tuberculin purified protein derivative  Home Medications   Prior to Admission medications   Medication Sig Start Date End Date Taking? Authorizing Provider  albuterol-ipratropium (COMBIVENT) 18-103 MCG/ACT inhaler Inhale 1 puff into the lungs daily. Patient taking differently: Inhale 1 puff into the lungs every 6 (six) hours as needed for wheezing or shortness of breath.  06/20/13   Gerda Diss, MD  amiodarone (PACERONE) 200 MG tablet Take 1 tablet (200 mg total) by mouth daily. 07/30/14   Renee A Kuneff, DO  aspirin EC 81 MG tablet Take 81 mg by mouth daily.    Historical Provider, MD  atorvastatin (LIPITOR) 40 MG tablet Take 1 tablet (40 mg total) by mouth daily. 07/30/14   Renee A Kuneff, DO  divalproex (DEPAKOTE ER) 500 MG 24 hr tablet Take 1 tablet (500 mg total) by mouth daily. 07/30/14   Renee A Kuneff, DO  levETIRAcetam (KEPPRA) 500 MG tablet TAKE 1 TABLET(500 MG) BY MOUTH TWICE DAILY 07/31/14   Renee A Kuneff, DO  loperamide (IMODIUM) 2 MG capsule 1-2 tabs TID PRN for diarrhea. Patient taking differently: Take 2 mg by mouth as needed for diarrhea or loose stools.  10/11/14   Patrecia Pour, MD  meclizine (ANTIVERT) 25 MG tablet Take 1 tablet (25 mg total) by mouth 3 (three) times daily as needed. 09/30/14   Patrecia Pour, MD  Multiple Vitamin (MULTIVITAMIN WITH MINERALS)  TABS tablet Take 1 tablet by mouth daily. 12/29/12   Kandis Nab, MD  NEXIUM 40 MG capsule TAKE ONE CAPSULE BY MOUTH EVERY MORNING WITH BREAKFAST 03/07/14   Renee A Kuneff, DO  ondansetron (ZOFRAN) 4 MG tablet Take 4 mg by mouth every 8 (eight) hours as needed for nausea or vomiting.    Historical Provider, MD  oxybutynin (DITROPAN) 5 MG tablet Take 1 tablet (5 mg total) by mouth 3 (three) times daily. 10/11/14   Patrecia Pour, MD  potassium chloride SA (K-DUR,KLOR-CON) 20 MEQ tablet TAKE 1 TABLET(20 MEQ) BY MOUTH DAILY 07/31/14   Renee A Kuneff, DO  rOPINIRole (REQUIP) 2 MG tablet take 0.5 tablet by mouth daily. 06/28/14   Historical Provider, MD   BP 117/82 mmHg  Pulse 60  Temp(Src) 98.6 F (37 C) (Oral)  Resp 18  SpO2 96%   Physical Exam  Constitutional: He is oriented to person, place, and time. Vital signs are normal. He appears well-developed and well-nourished.  Non-toxic appearance. He does not appear ill. No distress.  HENT:  Head: Normocephalic and atraumatic.  Nose: Nose normal.  Mouth/Throat: Oropharynx is clear and moist. No oropharyngeal exudate.  Eyes: Conjunctivae and EOM are normal. Pupils are equal, round, and reactive to light. No scleral icterus.  Neck: Normal range of motion. Neck supple. No tracheal deviation, no edema, no erythema and normal range of motion present. No thyroid mass and no thyromegaly present.  Cardiovascular: Normal rate, S1 normal, S2 normal, normal heart sounds, intact distal pulses and normal pulses.  An irregularly irregular rhythm present. Exam reveals no gallop and no friction rub.   No murmur heard. Pulses:      Radial pulses are 2+ on the right side, and 2+ on the left side.       Dorsalis pedis pulses are 2+ on the right side, and 2+ on the left side.  Pulmonary/Chest: Effort normal and breath sounds normal. No respiratory distress. He has no wheezes. He has no rhonchi. He has no rales.  Abdominal: Soft. Normal appearance and bowel sounds  are normal. He exhibits no distension, no ascites and no mass. There is no hepatosplenomegaly. There is no tenderness. There is no rebound, no guarding and no CVA tenderness.  Musculoskeletal: Normal range of motion. He exhibits no edema or tenderness.  Lymphadenopathy:    He has no cervical adenopathy.  Neurological: He is alert and oriented to person, place, and time. He has normal strength. No cranial nerve deficit or sensory deficit.  Skin: Skin is warm, dry and intact. No petechiae and no rash noted. He is not diaphoretic. No erythema. No pallor.  Psychiatric: He has a normal mood and affect. His behavior is normal. Judgment normal.  Nursing note and vitals reviewed.   ED Course  Procedures (including critical care time) DIAGNOSTIC STUDIES: Oxygen Saturation is 96% on RA, adequate by my interpretation.    COORDINATION OF CARE: 12:35 AM-Discussed treatment plan with pt at bedside and pt agreed to plan.     Labs Review Labs Reviewed  CBC - Abnormal; Notable for the following:    RDW 16.0 (*)    All other components within normal limits  BASIC METABOLIC PANEL  TROPONIN I  I-STAT TROPOININ, ED    Imaging Review Dg Chest 2 View  10/24/2014   CLINICAL DATA:  Ongoing facial numbness into the jaw since Wednesday. Worsening dizziness. Constant chest pain. History of smoking. AFib. History of stroke.  EXAM: CHEST  2 VIEW  COMPARISON:  07/31/2014  FINDINGS: Lungs are mildly hyperinflated. Heart size is normal. There are no focal consolidations or pleural effusions. No pulmonary edema. Old rib fractures. Mid thoracic spondylosis.  IMPRESSION: Hyperinflation.  No evidence for acute  abnormality.   Electronically Signed   By: Nolon Nations M.D.   On: 10/24/2014 20:00     EKG Interpretation   Date/Time:  Friday October 25 2014 00:52:12 EDT Ventricular Rate:  63 PR Interval:  173 QRS Duration: 91 QT Interval:  443 QTC  Calculation: 453 R Axis:   -12 Text Interpretation:  Sinus  rhythm Ventricular premature complex No acute  changes Confirmed by HORTON  MD, COURTNEY ([id number]) on 10/25/2014 12:56:47 AM      MDM   Final diagnoses:  None   Patient presents emergency department for chest pain. His history is not consistent with ACS.  Initial troponin is negative, EKG does not show any significant changes or signs of ischemia.  0200am - repeat troponin and EKG are negative again. Patient has otherwise appeared well the emergency department, his vital signs were within his normal limits and he is safe for discharge. Patient states he has an appointment today at 1:30 PM with a primary care physician, he was encouraged to attend this appointment for further management.    I personally performed the services described in this documentation, which was scribed in my presence. The recorded information has been reviewed and is accurate.   Everlene Balls, MD 10/25/14 [phone]

## 2014-10-30 ENCOUNTER — Ambulatory Visit (INDEPENDENT_AMBULATORY_CARE_PROVIDER_SITE_OTHER): Payer: Commercial Managed Care - HMO | Admitting: Family Medicine

## 2014-10-30 VITALS — BP 164/94 | HR 66 | Temp 98.7°F | Wt 207.6 lb

## 2014-10-30 DIAGNOSIS — R682 Dry mouth, unspecified: Secondary | ICD-10-CM

## 2014-10-30 DIAGNOSIS — R1310 Dysphagia, unspecified: Secondary | ICD-10-CM | POA: Diagnosis not present

## 2014-10-30 DIAGNOSIS — M5410 Radiculopathy, site unspecified: Secondary | ICD-10-CM | POA: Diagnosis not present
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
## 2014-10-30 NOTE — Patient Instructions (Addendum)
Thank you for coming in,   I would make a call to see Dr. Maurene Capes in order to find out if you need a swallow study.   Please bring all of your medications with you to each visit.   Sign up for My Chart to have easy access to your labs results, and communication with your Primary care physician   Please feel free to call with any questions or concerns at any time, at [phone]. --Dr. Raeford Razor

## 2014-10-31 DIAGNOSIS — R1310 Dysphagia, unspecified: Secondary | ICD-10-CM | POA: Insufficient documentation

## 2014-10-31 DIAGNOSIS — R682 Dry mouth, unspecified: Secondary | ICD-10-CM | POA: Insufficient documentation

## 2014-10-31 NOTE — Assessment & Plan Note (Signed)
Has trouble initiating swallowing with solid foods but nothing with liquids. Has a history of esophageal motility disorder apart in chart review Is followed by Dr. Maurene Capes from gastroenterology Reported dry mouth could be contributing - Ordered modified barium swallow - Also encouraged to follow-up with Dr. Maurene Capes

## 2014-10-31 NOTE — Assessment & Plan Note (Signed)
Patient with history of low back pain that has been ongoing for some years. Patient reports he had is unable to bend over and this interferes with his ADLs Is requesting the help of an in-home aide - sent email to SW, Hunt Oris, if his insurance would cover the help of an in home aide.

## 2014-10-31 NOTE — Assessment & Plan Note (Signed)
Most likely related to 1 or more the medications that he is currently taking. Could be contributed from meclizine, oxybutynin, or Imodium On exam today he looks like he has moist mucous membranes however - Advised that he take sips of water throughout the day - Had a discussion with him about discontinuing any of these medications and he didn't want to pursue that quite yet

## 2014-11-04 ENCOUNTER — Telehealth: Payer: Self-pay | Admitting: Family Medicine

## 2014-11-04 NOTE — Telephone Encounter (Signed)
Adele who is a Product/process development scientist for the patient wanted to know who we sent the orders to for the patient home health care. Please call her at [phone]. Blima Rich

## 2014-11-05 NOTE — Telephone Encounter (Signed)
-----   Message from Rosemarie Ax, MD sent at 10/30/2014  2:26 PM EDT ----- Dear Constance Holster,   This patient would like a personal care in home aide. He reports that his insurance would cover one.   He has some back pain and other chronic stuff. I am not his PCP.   Ysidro Evert

## 2014-11-05 NOTE — Telephone Encounter (Signed)
CSW contacted Adele at [employer] and informed her that unfortunately pt does not qualify for a PCS aide as he does not have Medicaid. CSW informed Adele that pt can contact [employer], his insurance and see whether his coverage includes an aide. Adele stated that pt was also denied by [facility] because he does not have a support person to list as an emergency contact. CSW confirmed that it is true that pt would need someone to agree to provide support to him in the event of an emergency in order to qualify for [facility].  Adele will share the above information with pt.  Hunt Oris, MSW, [place]

## 2014-11-11 ENCOUNTER — Encounter: Payer: Self-pay | Admitting: Internal Medicine

## 2014-11-11 ENCOUNTER — Ambulatory Visit (INDEPENDENT_AMBULATORY_CARE_PROVIDER_SITE_OTHER): Payer: Commercial Managed Care - HMO | Admitting: Internal Medicine

## 2014-11-11 VITALS — BP 124/82 | HR 62 | Ht 70.0 in | Wt 207.0 lb

## 2014-11-11 DIAGNOSIS — R079 Chest pain, unspecified: Secondary | ICD-10-CM

## 2014-11-11 DIAGNOSIS — I480 Paroxysmal atrial fibrillation: Secondary | ICD-10-CM

## 2014-11-11 MED ORDER — AMIODARONE HCL 200 MG PO TABS: 200.0000 mg | ORAL_TABLET | Freq: Every day | ORAL | Status: DC

## 2014-11-11 MED ORDER — ATORVASTATIN CALCIUM 40 MG PO TABS: 40.0000 mg | ORAL_TABLET | Freq: Every day | ORAL | Status: DC

## 2014-11-11 NOTE — Patient Instructions (Signed)
Your physician recommends that you continue on your current medications as directed. Please refer to the Current Medication list given to you today. Your physician wants you to follow-up in: 1 YEAR WITH DR. ROSS.  You will receive a reminder letter in the mail two months in advance. If you don't receive a letter, please call our office to schedule the follow-up appointment.  

## 2014-11-11 NOTE — Progress Notes (Signed)
Cardiology Office Note   Date:  11/11/2014   ID:  Patrick Holt, Patrick Holt [birth year], MRN [id number]  PCP:  Patrick Gather, MD  Cardiologist:   Patrick Carnes, MD   F/U of atrial fib  And CAD     History of Present Illness: Patrick Holt is a 64 y.o. male with a history of CAD, subdural hematoma questionable CVA, atrial fib (CHADS VAsc 3), GI bleeding, schizophrenia  Stopped eliquis . 2-D echo in the hospital on 01/04/14 showed  moderate LVH, EF 45-50% with akinesis of the basal mid inferior myocardium. There is grade 1 diastolic dysfunction. Left atrium was moderately dilated. Carotid Dopplers were okay. . Stress Myoview in January 2015 showed a fixed defect in the basal inferior and inferolateral walls that might represent old infarct or diaphragmatic attenuation. It was not gated so no ejection fraction was estimated.  I saw the pt in June 2016    Pt had numbness of lips    Went to ER  Felt having stroke  Seen in ER on 9/9 wotih CP  Sharp  Radiated to L arm and back  COnstant  At rest  Not felt to be cardiac  Sent home   Note that barium swallow ordered for problems swallowing      Current Outpatient Prescriptions  Medication Sig Dispense Refill  . albuterol-ipratropium (COMBIVENT) 18-103 MCG/ACT inhaler Inhale 1 puff into the lungs daily. (Patient taking differently: Inhale 1 puff into the lungs every 6 (six) hours as needed for wheezing or shortness of breath. ) 14.7 g 5  . amiodarone (PACERONE) 200 MG tablet Take 1 tablet (200 mg total) by mouth daily. 30 tablet 0  . aspirin EC 81 MG tablet Take 81 mg by mouth daily.    Marland Kitchen atorvastatin (LIPITOR) 40 MG tablet Take 1 tablet (40 mg total) by mouth daily. 90 tablet 1  . divalproex (DEPAKOTE ER) 500 MG 24 hr tablet Take 1 tablet (500 mg total) by mouth daily. 30 tablet 0  . levETIRAcetam (KEPPRA) 500 MG tablet TAKE 1 TABLET(500 MG) BY MOUTH TWICE DAILY 180 tablet 0  . loperamide (IMODIUM) 2 MG capsule 1-2 tabs TID PRN for diarrhea.  (Patient taking differently: Take 2 mg by mouth as needed for diarrhea or loose stools. ) 60 capsule 0  . meclizine (ANTIVERT) 25 MG tablet Take 1 tablet (25 mg total) by mouth 3 (three) times daily as needed. 30 tablet 0  . Multiple Vitamin (MULTIVITAMIN WITH MINERALS) TABS tablet Take 1 tablet by mouth daily. 90 tablet 3  . NEXIUM 40 MG capsule TAKE ONE CAPSULE BY MOUTH EVERY MORNING WITH BREAKFAST 90 capsule 3  . ondansetron (ZOFRAN) 4 MG tablet Take 4 mg by mouth every 8 (eight) hours as needed for nausea or vomiting.    Marland Kitchen oxybutynin (DITROPAN) 5 MG tablet Take 1 tablet (5 mg total) by mouth 3 (three) times daily. 90 tablet 0  . potassium chloride SA (K-DUR,KLOR-CON) 20 MEQ tablet TAKE 1 TABLET(20 MEQ) BY MOUTH DAILY 90 tablet 0  . rOPINIRole (REQUIP) 2 MG tablet take 0.5 tablet by mouth daily.  1   No current facility-administered medications for this visit.    Allergies:   Immune globulins; Ciprofloxacin; Flagyl; Lisinopril; Sulfa antibiotics; Betamethasone dipropionate; Bupropion hcl; Clobetasol; Codeine; Escitalopram oxalate; Fluoxetine hcl; Fluticasone-salmeterol; Furosemide; Paroxetine; Penicillins; Tacrolimus; Tetanus toxoid; and Tuberculin purified protein derivative   Past Medical History  Diagnosis Date  . Rheumatoid arthritis(714.0)   . Obesity   . Major depression  with acute psychotic break in 06/2010  . Hypertension   . Hyperlipidemia   . Diverticulosis of colon   . COPD (chronic obstructive pulmonary disease)   . Anxiety   . GERD (gastroesophageal reflux disease)   . Vertigo   . Fibromyalgia   . Dermatomyositis   . Myocardial infarct     mulitple (1999, 2000, 2004)  . Raynaud's disease   . Narcotic dependence   . Peripheral neuropathy   . Internal hemorrhoids   . Ischemic heart disease   . Hiatal hernia   . Gastritis   . Diverticulitis   . Hx of adenomatous colonic polyps   . Nephrolithiasis   . Anemia   . Esophageal stricture   . Esophageal dysmotility    . Dermatomyositis   . Urge incontinence   . Otosclerosis   . Bipolar 1 disorder   . OCD (obsessive compulsive disorder)   . Sarcoidosis   . Paroxysmal a-fib   . Dysrhythmia     "irregular" (11/15/2012)  . Type II diabetes mellitus   . Seizures   . Headache(784.0)     "severe; get them often" (11/15/2012)  . Subarachnoid hemorrhage 01/2012    with subdural  hematoma.   . Atrial fibrillation 01/2012    with RVR  . Conversion disorder 06/2010  . History of narcotic addiction   . Asthma   . Cancer   . Collagen vascular disease   . Renal insufficiency     Past Surgical History  Procedure Laterality Date  . Knee arthroscopy w/ meniscal repair Left 2009  . Lumbar disc surgery    . Squamous papilloma   2010    removed by Dr. Constance Holt ENT, noted on tongue  . Esophagogastroduodenoscopy N/A 09/27/2012    Procedure: ESOPHAGOGASTRODUODENOSCOPY (EGD);  Surgeon: Patrick Dragon, MD;  Location: [facility];  Service: Endoscopy;  Laterality: N/A;  . Colonoscopy N/A 09/27/2012    Procedure: COLONOSCOPY;  Surgeon: Patrick Dragon, MD;  Location: [facility];  Service: Endoscopy;  Laterality: N/A;  . Cataract extraction w/ intraocular lens implant Left   . Lymph node dissection Right 1970's    "neck; dr thought I had Hodgkins; turned out to be sarcoidosis" (11/15/2012)  . Tonsillectomy    . Carpal tunnel release Bilateral   . Cardiac catheterization    . Back surgery       Social History:  The patient  reports that he has quit smoking. His smoking use included Cigarettes. He quit after 30 years of use. He has never used smokeless tobacco. He reports that he does not drink alcohol or use illicit drugs.   Family History:  The patient's family history includes Alcohol abuse in his mother; Coronary artery disease in his father; Depression in his mother; Diabetes in his mother and other; Heart disease in his mother; Hepatitis in his brother; Stroke in his mother.    ROS:  Please see the history of  present illness. All other systems are reviewed and  Negative to the above problem except as noted.    PHYSICAL EXAM: VS:  BP 124/82 mmHg  Pulse 62  Ht 5\' 10"  (1.778 m)  Wt 207 lb (93.895 kg)  BMI 29.70 kg/m2  GEN: Well nourished, well developed, in no acute distress HEENT: normal Neck: no JVD, carotid bruits, or masses Cardiac: RRR; no murmurs, rubs, or gallops,no edema  Respiratory:  clear to auscultation bilaterally, normal work of breathing GI: soft, nontender, nondistended, + BS  No hepatomegaly  MS: no  deformity Moving all extremities   Skin: warm and dry, no rash Neuro:  Strength and sensation are intact Psych: euthymic mood, full affect   EKG:  EKG is not ordered today.   Lipid Panel    Component Value Date/Time   CHOL 104 07/17/2014 0805   TRIG 96 07/17/2014 0805   HDL 34* 07/17/2014 0805   CHOLHDL 3.1 07/17/2014 0805   VLDL 19 07/17/2014 0805   LDLCALC 51 07/17/2014 0805   LDLDIRECT 77 10/12/2013 1417      Wt Readings from Last 3 Encounters:  11/11/14 207 lb (93.895 kg)  10/30/14 207 lb 9.6 oz (94.167 kg)  10/22/14 200 lb (90.719 kg)      ASSESSMENT AND PLAN:  1  Atrail fib  Keep on amiodarone  Not felt to be a coumadin candidate   2.  CP  Atypical  None now  Follow    2.  HTN  Continue medical Rx   3.  HL    F/U in 1 year  Sooner for problems      Signed, Patrick Carnes, MD  11/11/2014 10:50 AM    [address], [place], [place]  [address] Phone: [phone]; Fax: [phone]

## 2014-11-12 ENCOUNTER — Other Ambulatory Visit: Payer: Self-pay | Admitting: Family Medicine

## 2014-11-15 MED ORDER — OXYBUTYNIN CHLORIDE 5 MG PO TABS: 5.0000 mg | ORAL_TABLET | Freq: Three times a day (TID) | ORAL | Status: DC

## 2014-11-15 NOTE — Telephone Encounter (Signed)
Can you please call to see if he can schedule a follow up appointment. We started some new medications at our last visit and I just received a refill request without knowing how these are working for him. I will refill x1 month.

## 2014-11-19 NOTE — Progress Notes (Signed)
REview of records.  Patient can cut back on amiodarone to 100 mg per day Please put in for f/u in May 2017

## 2014-11-20 ENCOUNTER — Telehealth: Payer: Self-pay | Admitting: *Deleted

## 2014-11-20 MED ORDER — AMIODARONE HCL 200 MG PO TABS: 100.0000 mg | ORAL_TABLET | Freq: Every day | ORAL | Status: DC

## 2014-11-20 NOTE — Telephone Encounter (Signed)
-----   Message from Fay Records, MD sent at 11/19/2014  7:55 AM EDT -----   ----- Message -----    From: Fay Records, MD    Sent: 11/13/2014  10:56 PM      To: Fay Records, MD

## 2014-11-20 NOTE — Telephone Encounter (Signed)
Fay Records, MD at 11/19/2014 7:54 AM     Status: Signed       Expand All Collapse All   REview of records. Patient can cut back on amiodarone to 100 mg per day Please put in for f/u in May 2017       INFORMED PATIENT TO TAKE AMIODARONE 100 MG DAILY.  HE WILL CUT HIS PILLS IN HALF.

## 2014-11-29 ENCOUNTER — Telehealth: Payer: Self-pay | Admitting: Family Medicine

## 2014-11-29 NOTE — Telephone Encounter (Signed)
Need refills on his Ropinrole, Combivent inhaler and Potassium Chloride SA sent to [facility] on [address].

## 2014-12-03 ENCOUNTER — Ambulatory Visit (INDEPENDENT_AMBULATORY_CARE_PROVIDER_SITE_OTHER): Payer: Commercial Managed Care - HMO | Admitting: Family Medicine

## 2014-12-03 ENCOUNTER — Encounter: Payer: Self-pay | Admitting: Family Medicine

## 2014-12-03 VITALS — BP 129/83 | HR 73 | Temp 99.1°F | Ht 70.0 in | Wt 212.2 lb

## 2014-12-03 DIAGNOSIS — J410 Simple chronic bronchitis: Secondary | ICD-10-CM | POA: Diagnosis not present

## 2014-12-03 DIAGNOSIS — I10 Essential (primary) hypertension: Secondary | ICD-10-CM | POA: Diagnosis not present

## 2014-12-03 DIAGNOSIS — R682 Dry mouth, unspecified: Secondary | ICD-10-CM

## 2014-12-03 DIAGNOSIS — R351 Nocturia: Secondary | ICD-10-CM | POA: Diagnosis not present

## 2014-12-03 DIAGNOSIS — Z23 Encounter for immunization: Secondary | ICD-10-CM | POA: Diagnosis not present

## 2014-12-03 MED ORDER — ROPINIROLE HCL 2 MG PO TABS: 1.0000 mg | ORAL_TABLET | Freq: Every day | ORAL | Status: DC

## 2014-12-03 MED ORDER — IPRATROPIUM-ALBUTEROL 18-103 MCG/ACT IN AERO: 1.0000 | INHALATION_SPRAY | Freq: Four times a day (QID) | RESPIRATORY_TRACT | Status: DC | PRN

## 2014-12-03 MED ORDER — POTASSIUM CHLORIDE CRYS ER 20 MEQ PO TBCR
EXTENDED_RELEASE_TABLET | ORAL | Status: DC
Start: 2014-12-03 — End: 2014-12-14

## 2014-12-03 NOTE — Progress Notes (Signed)
Subjective: Patrick Holt is a 64 y.o. male presenting for follow up.  Among a myriad of complaints, Patrick Holt says that having a dry mouth has been troubling him the most. He has trouble swallowing solids but not liquids due to dry mouth. Tried ACT without improvement.  Has a history of urge incontinence. No urinary hesitancy or weak stream. Reports nocturia ( reported 6 - 8 episodes of nocturia, all "normal" volume) and 3 episodes of urination per day. Drinks about a gallon of water per day even up to bedtime. Reports oxybutynin has not changed these symptoms at all.   - Now a resident of ALF reports being satisfied with it for the most part. He is able to ambulate well with a cane.  - ROS: Has been to the hospital twice recently, once for chest pain which was determined to be not cardiogenic and once for dry mouth and oral numbness where a CVA was ruled out by MRI. These symptoms have resolved and he furthermore denies HA, dizziness, syncope, palpitations, abd pain, N/V/D. No weight loss, night sweats, malaise.  - Non-smoker  Objective: BP 129/83 mmHg  Pulse 73  Temp(Src) 99.1 F (37.3 C) (Oral)  Ht '5\' 10"'  (1.778 m)  Wt 212 lb 3.2 oz (96.253 kg)  BMI 30.45 kg/m2 BP 129/83 mmHg  Pulse 73  Temp(Src) 99.1 F (37.3 C) (Oral)  Ht '5\' 10"'  (1.778 m)  Wt 212 lb 3.2 oz (96.253 kg)  BMI 30.45 kg/m2 Gen: Well-appearing 64 y.o.male in NAD HEENT: MMM, posterior oropharynx clear without obstructing lesion. Upper dentures.  Pulm: Non-labored; CTAB, no wheezes  CV: Regular rate, no murmur appreciated; distal pulses intact/symmetric GI: + BS; soft, non-tender, non-distended Skin: No rashes, wounds, ulcers Neuro: A&Ox3, CN II-XII without deficits Psych: Neatly groomed. Maintains good eye contact and is cooperative and attentive. Speech is normal volume and rate. Mood is depressed with a mildly restricted affect. Thought process is somewhat tangential but goal directed. No suicidal or  homicidal ideation. Does not appear to be responding to any internal stimuli. Able to maintain train of thought and concentrate on the questions.  Assessment/Plan: Patrick Holt is a 64 y.o. male here for dry mouth.  Essential hypertension, benign At goal. Stable.   COPD (chronic obstructive pulmonary disease) (HCC) No signs of exacerbation. Will refill combivent.   Dry mouth Not taking meclizine and we will now stop oxybutynin given the lack of benefits from this. Will follow up in 1 month to see if this helps. If not, in setting of history of esophageal stricture/dysmotility, we will refer to GI and/or perform swallow evaluation.

## 2014-12-03 NOTE — Assessment & Plan Note (Signed)
No signs of exacerbation. Will refill combivent.

## 2014-12-03 NOTE — Patient Instructions (Signed)
Thank you for coming in today!  - Stop taking oxybutynin - Start a record of your voids, both day and night - Try not drinking fluids after 5 or 6 pm  Our clinic's number is [phone]. Feel free to call any time with questions or concerns. We will answer any questions after hours with our 24-hour emergency line at that number as well.   - Dr. Bonner Puna

## 2014-12-03 NOTE — Assessment & Plan Note (Signed)
At goal. Stable.

## 2014-12-04 DIAGNOSIS — Z23 Encounter for immunization: Secondary | ICD-10-CM

## 2014-12-04 NOTE — Assessment & Plan Note (Signed)
Not taking meclizine and we will now stop oxybutynin given the lack of benefits from this. Will follow up in 1 month to see if this helps. If not, in setting of history of esophageal stricture/dysmotility, we will refer to GI and/or perform swallow evaluation.

## 2014-12-14 ENCOUNTER — Encounter (HOSPITAL_COMMUNITY): Payer: Self-pay | Admitting: Emergency Medicine

## 2014-12-14 ENCOUNTER — Emergency Department (HOSPITAL_COMMUNITY): Payer: Commercial Managed Care - HMO

## 2014-12-14 ENCOUNTER — Emergency Department (HOSPITAL_COMMUNITY)
Admission: EM | Admit: 2014-12-14 | Discharge: 2014-12-14 | Disposition: A | Discharge status: Home / Self Care | Payer: Commercial Managed Care - HMO | Attending: Emergency Medicine | Admitting: Emergency Medicine

## 2014-12-14 DIAGNOSIS — Z86018 Personal history of other benign neoplasm: Secondary | ICD-10-CM | POA: Insufficient documentation

## 2014-12-14 DIAGNOSIS — Z9889 Other specified postprocedural states: Secondary | ICD-10-CM | POA: Insufficient documentation

## 2014-12-14 DIAGNOSIS — G40909 Epilepsy, unspecified, not intractable, without status epilepticus: Secondary | ICD-10-CM | POA: Diagnosis not present

## 2014-12-14 DIAGNOSIS — R1084 Generalized abdominal pain: Secondary | ICD-10-CM | POA: Insufficient documentation

## 2014-12-14 DIAGNOSIS — Z859 Personal history of malignant neoplasm, unspecified: Secondary | ICD-10-CM | POA: Insufficient documentation

## 2014-12-14 DIAGNOSIS — Z7982 Long term (current) use of aspirin: Secondary | ICD-10-CM | POA: Diagnosis not present

## 2014-12-14 DIAGNOSIS — I10 Essential (primary) hypertension: Secondary | ICD-10-CM | POA: Diagnosis not present

## 2014-12-14 DIAGNOSIS — Z8719 Personal history of other diseases of the digestive system: Secondary | ICD-10-CM | POA: Diagnosis not present

## 2014-12-14 DIAGNOSIS — R200 Anesthesia of skin: Secondary | ICD-10-CM | POA: Diagnosis not present

## 2014-12-14 DIAGNOSIS — E119 Type 2 diabetes mellitus without complications: Secondary | ICD-10-CM | POA: Insufficient documentation

## 2014-12-14 DIAGNOSIS — E785 Hyperlipidemia, unspecified: Secondary | ICD-10-CM | POA: Insufficient documentation

## 2014-12-14 DIAGNOSIS — Z87442 Personal history of urinary calculi: Secondary | ICD-10-CM | POA: Diagnosis not present

## 2014-12-14 DIAGNOSIS — H5712 Ocular pain, left eye: Secondary | ICD-10-CM | POA: Diagnosis not present

## 2014-12-14 DIAGNOSIS — G478 Other sleep disorders: Secondary | ICD-10-CM | POA: Diagnosis not present

## 2014-12-14 DIAGNOSIS — M069 Rheumatoid arthritis, unspecified: Secondary | ICD-10-CM | POA: Insufficient documentation

## 2014-12-14 DIAGNOSIS — E669 Obesity, unspecified: Secondary | ICD-10-CM | POA: Diagnosis not present

## 2014-12-14 DIAGNOSIS — Z87448 Personal history of other diseases of urinary system: Secondary | ICD-10-CM | POA: Diagnosis not present

## 2014-12-14 DIAGNOSIS — Z872 Personal history of diseases of the skin and subcutaneous tissue: Secondary | ICD-10-CM | POA: Insufficient documentation

## 2014-12-14 DIAGNOSIS — Z79899 Other long term (current) drug therapy: Secondary | ICD-10-CM | POA: Diagnosis not present

## 2014-12-14 DIAGNOSIS — J449 Chronic obstructive pulmonary disease, unspecified: Secondary | ICD-10-CM | POA: Diagnosis not present

## 2014-12-14 DIAGNOSIS — Z880 Allergy status to penicillin: Secondary | ICD-10-CM | POA: Insufficient documentation

## 2014-12-14 DIAGNOSIS — I252 Old myocardial infarction: Secondary | ICD-10-CM | POA: Insufficient documentation

## 2014-12-14 DIAGNOSIS — Z720 Tobacco use: Secondary | ICD-10-CM | POA: Diagnosis not present

## 2014-12-14 DIAGNOSIS — J45909 Unspecified asthma, uncomplicated: Secondary | ICD-10-CM | POA: Insufficient documentation

## 2014-12-14 DIAGNOSIS — Z862 Personal history of diseases of the blood and blood-forming organs and certain disorders involving the immune mechanism: Secondary | ICD-10-CM | POA: Diagnosis not present

## 2014-12-14 DIAGNOSIS — I4891 Unspecified atrial fibrillation: Secondary | ICD-10-CM | POA: Diagnosis not present

## 2014-12-14 LAB — CBC WITH DIFFERENTIAL/PLATELET
BASOS ABS: 0 10*3/uL (ref 0.0–0.1)
Basophils Relative: 0 %
Eosinophils Absolute: 0.2 10*3/uL (ref 0.0–0.7)
Eosinophils Relative: 3 %
HEMATOCRIT: 46 % (ref 39.0–52.0)
Hemoglobin: 15.5 g/dL (ref 13.0–17.0)
LYMPHS PCT: 35 %
Lymphs Abs: 2.1 10*3/uL (ref 0.7–4.0)
MCH: 30.2 pg (ref 26.0–34.0)
MCHC: 33.7 g/dL (ref 30.0–36.0)
MCV: 89.7 fL (ref 78.0–100.0)
Monocytes Absolute: 0.5 10*3/uL (ref 0.1–1.0)
Monocytes Relative: 9 %
NEUTROS ABS: 3.2 10*3/uL (ref 1.7–7.7)
Neutrophils Relative %: 53 %
Platelets: 252 10*3/uL (ref 150–400)
RBC: 5.13 MIL/uL (ref 4.22–5.81)
RDW: 14.6 % (ref 11.5–15.5)
WBC: 6 10*3/uL (ref 4.0–10.5)

## 2014-12-14 LAB — COMPREHENSIVE METABOLIC PANEL
ALT: 16 U/L — AB (ref 17–63)
AST: 20 U/L (ref 15–41)
Albumin: 3.4 g/dL — ABNORMAL LOW (ref 3.5–5.0)
Alkaline Phosphatase: 66 U/L (ref 38–126)
Anion gap: 7 (ref 5–15)
BILIRUBIN TOTAL: 0.9 mg/dL (ref 0.3–1.2)
BUN: 11 mg/dL (ref 6–20)
CO2: 27 mmol/L (ref 22–32)
CREATININE: 1.11 mg/dL (ref 0.61–1.24)
Calcium: 8.7 mg/dL — ABNORMAL LOW (ref 8.9–10.3)
Chloride: 103 mmol/L (ref 101–111)
GFR calc Af Amer: >60 mL/min (ref >60)
Glucose, Bld: 93 mg/dL (ref 65–99)
Potassium: 3.1 mmol/L — ABNORMAL LOW (ref 3.5–5.1)
Sodium: 137 mmol/L (ref 135–145)
TOTAL PROTEIN: 6.2 g/dL — AB (ref 6.5–8.1)

## 2014-12-14 LAB — APTT: aPTT: 31 seconds (ref 24–37)

## 2014-12-14 LAB — PROTIME-INR
INR: 1.05 (ref 0.00–1.49)
PROTHROMBIN TIME: 13.9 s (ref 11.6–15.2)

## 2014-12-14 LAB — ETHANOL: Alcohol, Ethyl (B): <5 mg/dL (ref <5)

## 2014-12-14 MED ORDER — POTASSIUM CHLORIDE CRYS ER 20 MEQ PO TBCR
40.0000 meq | EXTENDED_RELEASE_TABLET | Freq: Once | ORAL | Status: AC
  Administered 2014-12-14: 40 meq via ORAL
  Filled 2014-12-14: qty 2

## 2014-12-14 NOTE — ED Notes (Signed)
Per EMS:  Pt here after waking up this evening with some left sided facial numbness.  Pt has a hx of facial numbness, but states this is different than previous times.   Pt has a hx of parkinsons, and fibromyalgia.  Pt able to ambulate with cane with EMS.  Gait steady and even.  Pt also c/o dry mouth and left sided HA.  Pt alert and oriented in room at this time.

## 2014-12-14 NOTE — ED Notes (Signed)
The pt went to c-t and returned to the treatemnt room  Asking for mouth swabs given

## 2014-12-14 NOTE — ED Notes (Signed)
Reviewed discharge instructions and follow-up information with patient. Patient verbalized understanding. VSS. Patient waiting on [facility] to arrive [facility].

## 2014-12-14 NOTE — ED Notes (Signed)
PA at bedside at this time.  

## 2014-12-14 NOTE — ED Provider Notes (Signed)
CSN: [id number]     Arrival date & time 12/14/14  0245 History   First MD Initiated Contact with Patient 12/14/14 0257     Chief Complaint  Patient presents with  . facial numbness     HPI   Patrick Holt is a 64 y.o. male with a PMH of HTN, HLD, MI, COPD, atrial fibrillation who presents to the ED with left-sided facial numbness. He states he went to bed around 8 PM and woke up at midnight, at which time he noticed the left side of his face felt numb. He states this has never happened to him before. He denies exacerbating or alleviating factors. He reports associated left eye pain. He denies vision changes, and states he has floaters. He reports he feels like he has had difficulty with speech today. He denies facial asymmetry. He denies fever, chills, headache, lightheadedness, dizziness. He denies chest pain, shortness of breath, abdominal pain, nausea, vomiting, diarrhea, constipation.   Past Medical History  Diagnosis Date  . Rheumatoid arthritis(714.0)   . Obesity   . Major depression ([place])     with acute psychotic break in 06/2010  . Hypertension   . Hyperlipidemia   . Diverticulosis of colon   . COPD (chronic obstructive pulmonary disease) ([place])   . Anxiety   . GERD (gastroesophageal reflux disease)   . Vertigo   . Fibromyalgia   . Dermatomyositis ([place])   . Myocardial infarct ([place])     mulitple (1999, 2000, 2004)  . Raynaud's disease   . Narcotic dependence ([place])   . Peripheral neuropathy ([place])   . Internal hemorrhoids   . Ischemic heart disease   . Hiatal hernia   . Gastritis   . Diverticulitis   . Hx of adenomatous colonic polyps   . Nephrolithiasis   . Anemia   . Esophageal stricture   . Esophageal dysmotility   . Dermatomyositis ([place])   . Urge incontinence   . Otosclerosis   . Bipolar 1 disorder ([place])   . OCD (obsessive compulsive disorder)   . Sarcoidosis ([place])   . Paroxysmal a-fib ([place])   . Dysrhythmia     "irregular" (11/15/2012)  . Type II diabetes  mellitus ([place])   . Seizures ([place])   . Headache(784.0)     "severe; get them often" (11/15/2012)  . Subarachnoid hemorrhage ([place]) 01/2012    with subdural  hematoma.   . Atrial fibrillation ([place]) 01/2012    with RVR  . Conversion disorder 06/2010  . History of narcotic addiction ([place])   . Asthma   . Cancer ([place])   . Collagen vascular disease ([place])   . Renal insufficiency    Past Surgical History  Procedure Laterality Date  . Knee arthroscopy w/ meniscal repair Left 2009  . Lumbar disc surgery    . Squamous papilloma   2010    removed by Dr. Constance Holster ENT, noted on tongue  . Esophagogastroduodenoscopy N/A 09/27/2012    Procedure: ESOPHAGOGASTRODUODENOSCOPY (EGD);  Surgeon: Lafayette Dragon, MD;  Location: [facility];  Service: Endoscopy;  Laterality: N/A;  . Colonoscopy N/A 09/27/2012    Procedure: COLONOSCOPY;  Surgeon: Lafayette Dragon, MD;  Location: [facility];  Service: Endoscopy;  Laterality: N/A;  . Cataract extraction w/ intraocular lens implant Left   . Lymph node dissection Right 1970's    "neck; dr thought I had Hodgkins; turned out to be sarcoidosis" (11/15/2012)  . Tonsillectomy    . Carpal tunnel release Bilateral   . Cardiac  catheterization    . Back surgery     Family History  Problem Relation Age of Onset  . Alcohol abuse Mother   . Depression Mother   . Heart disease Mother   . Diabetes Mother   . Stroke Mother   . Diabetes Other     1/2 brother  . Hepatitis Brother   . Coronary artery disease Father    Social History  Substance Use Topics  . Smoking status: Current Every Day Smoker -- 0.50 packs/day for 30 years    Types: Cigarettes  . Smokeless tobacco: Never Used     Comment: 3 cigs a day  . Alcohol Use: No     Comment: Alcohol stopped in September of 2014     Review of Systems  Constitutional: Negative for fever and chills.  Respiratory: Negative for shortness of breath.   Cardiovascular: Negative for chest pain.  Gastrointestinal: Negative for  nausea, vomiting, abdominal pain, diarrhea and constipation.  Neurological: Positive for speech difficulty and numbness. Negative for dizziness, syncope, weakness, light-headedness and headaches.  All other systems reviewed and are negative.     Allergies  Immune globulins; Ciprofloxacin; Flagyl; Lisinopril; Sulfa antibiotics; Betamethasone dipropionate; Bupropion hcl; Clobetasol; Codeine; Escitalopram oxalate; Fluoxetine hcl; Fluticasone-salmeterol; Furosemide; Paroxetine; Penicillins; Tacrolimus; Tetanus toxoid; and Tuberculin purified protein derivative  Home Medications   Prior to Admission medications   Medication Sig Start Date End Date Taking? Authorizing Provider  albuterol-ipratropium (COMBIVENT) 18-103 MCG/ACT inhaler Inhale 1 puff into the lungs every 6 (six) hours as needed for wheezing or shortness of breath. 12/03/14  Yes Patrecia Pour, MD  amiodarone (PACERONE) 200 MG tablet Take 0.5 tablets (100 mg total) by mouth daily. 11/20/14  Yes Fay Records, MD  aspirin EC 81 MG tablet Take 81 mg by mouth daily.   Yes Historical Provider, MD  atorvastatin (LIPITOR) 40 MG tablet Take 1 tablet (40 mg total) by mouth daily. 11/11/14  Yes Fay Records, MD  divalproex (DEPAKOTE ER) 500 MG 24 hr tablet Take 1 tablet (500 mg total) by mouth daily. 07/30/14  Yes Renee A Kuneff, DO  levETIRAcetam (KEPPRA) 500 MG tablet TAKE 1 TABLET(500 MG) BY MOUTH TWICE DAILY 07/31/14  Yes Renee A Kuneff, DO  loperamide (IMODIUM) 2 MG capsule TAKE 1 TO 2 CAPSULES BY MOUTH 3 TIMES A DAY AS NEEDED FOR DIARRHEA 11/15/14  Yes Patrecia Pour, MD  NEXIUM 40 MG capsule TAKE ONE CAPSULE BY MOUTH EVERY MORNING WITH BREAKFAST 03/07/14  Yes Renee A Kuneff, DO  rOPINIRole (REQUIP) 2 MG tablet Take 0.5 tablets (1 mg total) by mouth at bedtime. 12/03/14  Yes Patrecia Pour, MD    BP 152/89 mmHg  Pulse 56  Temp(Src) 98.4 F (36.9 C) (Oral)  Resp 13  SpO2 98% Physical Exam  Constitutional: He is oriented to person, place, and  time. He appears well-developed and well-nourished. No distress.  HENT:  Head: Normocephalic and atraumatic.  Right Ear: External ear normal.  Left Ear: External ear normal.  Nose: Nose normal.  Mouth/Throat: Uvula is midline, oropharynx is clear and moist and mucous membranes are normal.  Eyes: Conjunctivae, EOM and lids are normal. Pupils are equal, round, and reactive to light. Right eye exhibits no discharge. Left eye exhibits no discharge. Right conjunctiva is not injected. Right conjunctiva has no hemorrhage. Left conjunctiva is not injected. Left conjunctiva has no hemorrhage. No scleral icterus.  Neck: Normal range of motion. Neck supple.  Cardiovascular: Normal rate, regular rhythm, normal  heart sounds, intact distal pulses and normal pulses.   Pulmonary/Chest: Effort normal and breath sounds normal. No respiratory distress. He has no wheezes. He has no rales. He exhibits no tenderness.  Abdominal: Soft. Normal appearance and bowel sounds are normal. He exhibits no distension and no mass. There is tenderness. There is no rigidity, no rebound and no guarding.  Minimal diffuse tenderness to palpation. No rebound, guarding, or masses.  Musculoskeletal: Normal range of motion. He exhibits no edema or tenderness.  Neurological: He is alert and oriented to person, place, and time. He has normal strength. No cranial nerve deficit.  Decreased sensation to light touch of left forehead and face. Speech fluent.  Skin: Skin is warm, dry and intact. No rash noted. He is not diaphoretic. No erythema. No pallor.  Psychiatric: He has a normal mood and affect. His speech is normal and behavior is normal.  Nursing note and vitals reviewed.   ED Course  Procedures (including critical care time)   Labs Review Labs Reviewed  COMPREHENSIVE METABOLIC PANEL - Abnormal; Notable for the following:    Potassium 3.1 (*)    Calcium 8.7 (*)    Total Protein 6.2 (*)    Albumin 3.4 (*)    ALT 16 (*)    All  other components within normal limits  CBC WITH DIFFERENTIAL/PLATELET  PROTIME-INR  APTT  ETHANOL    Imaging Review Ct Head Wo Contrast  12/14/2014  CLINICAL DATA:  Acute onset of left-sided facial numbness. Initial encounter. EXAM: CT HEAD WITHOUT CONTRAST TECHNIQUE: Contiguous axial images were obtained from the base of the skull through the vertex without intravenous contrast. COMPARISON:  CT of the head performed 07/08/2014, and MRI of the brain performed 10/22/2014 kids FINDINGS: There is no evidence of acute infarction, mass lesion, or intra- or extra-axial hemorrhage on CT. Prominence of the sulci suggests mild cortical volume loss. Scattered periventricular and subcortical white matter change likely reflects small vessel ischemic microangiopathy. The brainstem and fourth ventricle are within normal limits. The basal ganglia are unremarkable in appearance. The cerebral hemispheres demonstrate grossly normal gray-white differentiation. No mass effect or midline shift is seen. There is no evidence of fracture; visualized osseous structures are unremarkable in appearance. The orbits are within normal limits. The paranasal sinuses and mastoid air cells are well-aerated. No significant soft tissue abnormalities are seen. IMPRESSION: 1. No evidence of traumatic intracranial injury or fracture. 2. Mild cortical volume loss and scattered small vessel ischemic microangiopathy. Electronically Signed   By: Garald Balding M.D.   On: 12/14/2014 04:10   I have personally reviewed and evaluated these images and lab results as part of my medical decision-making.   EKG Interpretation   Date/Time:  Saturday December 14 2014 02:48:15 EDT Ventricular Rate:  59 PR Interval:  182 QRS Duration: 94 QT Interval:  486 QTC Calculation: 481 R Axis:   15 Text Interpretation:  Sinus rhythm No significant change since last  tracing Confirmed by Glynn Octave [phone]) on 12/14/2014 4:13:27  AM      MDM    Final diagnoses:  Left facial numbness  Left eye pain    64 year old male presents with left facial numbness, which he states he noticed on waking up around midnight tonight. He also reports left eye pain and floaters. States he feels like he has had difficulty with speech today. Denies facial asymmetry. Denies fever, chills, headache, lightheadedness, dizziness, chest pain, shortness of breath, abdominal pain, nausea, vomiting, diarrhea, constipation.  Patient is  afebrile. Vital signs stable. Heart regular rate and rhythm. Lungs clear to auscultation bilaterally. Abdomen soft, nontender, nondistended. Decreased sensation to light touch over left forehead and face. Otherwise, normal neuro exam with no focal deficit. Strength, sensation, coordination intact.  CBC negative for leukocytosis or anemia. CMP remarkable for potassium 3.1, repleted in the ED. INR and PTT within normal limits. Ethanol negative. Head CT negative for traumatic intracranial injury or fracture, reveals mild cortical volume loss and scattered small vessel ischemic microangiopathy.  Patient was evaluated in the ED in September for numbness around his mouth, and had a normal MRI at that time. Patient discussed with Dr. Claudine Mouton. Patient is well appearing, feel he is stable for discharge at this time.  Return precautions discussed. Instructed to follow-up with ophthalmology.  BP 152/89 mmHg  Pulse 56  Temp(Src) 98.4 F (36.9 C) (Oral)  Resp 13  SpO2 98%   Marella Chimes, PA-C 12/14/14 [id number]  Everlene Balls, MD 12/14/14 [phone]

## 2014-12-14 NOTE — Discharge Instructions (Signed)
1. Medications: usual home medications 2. Treatment: rest, drink plenty of fluids 3. Follow Up: please followup with ophthalmology for discussion of your diagnoses and further evaluation after today's visit; if you do not have a primary care doctor use the resource guide provided to find one; please return to the ER for high fever, severe headache, confusion, loss of consciousness, new or worsening symptoms    Paresthesia Paresthesia is an abnormal burning or prickling sensation. This sensation is generally felt in the hands, arms, legs, or feet. However, it may occur in any part of the body. Usually, it is not painful. The feeling may be described as:  Tingling or numbness.  Pins and needles.  Skin crawling.  Buzzing.  Limbs falling asleep.  Itching. Most people experience temporary (transient) paresthesia at some time in their lives. Paresthesia may occur when you breathe too quickly (hyperventilation). It can also occur without any apparent cause. Commonly, paresthesia occurs when pressure is placed on a nerve. The sensation quickly goes away after the pressure is removed. For some people, however, paresthesia is a long-lasting (chronic) condition that is caused by an underlying disorder. If you continue to have paresthesia, you may need further medical evaluation. HOME CARE INSTRUCTIONS Watch your condition for any changes. Taking the following actions may help to lessen any discomfort that you are feeling:  Avoid drinking alcohol.  Try acupuncture or massage to help relieve your symptoms.  Keep all follow-up visits as directed by your health care provider. This is important. SEEK MEDICAL CARE IF:  You continue to have episodes of paresthesia.  Your burning or prickling feeling gets worse when you walk.  You have pain, cramps, or dizziness.  You develop a rash. SEEK IMMEDIATE MEDICAL CARE IF:  You feel weak.  You have trouble walking or moving.  You have problems with  speech, understanding, or vision.  You feel confused.  You cannot control your bladder or bowel movements.  You have numbness after an injury.  You faint.   This information is not intended to replace advice given to you by your health care provider. Make sure you discuss any questions you have with your health care provider.   Document Released: 01/22/2002 Document Revised: 06/18/2014 Document Reviewed: 01/28/2014 Elsevier Interactive Patient Education 2016 [employer].   Emergency Department Resource Guide 1) Find a Doctor and Pay Out of Pocket Although you won't have to find out who is covered by your insurance plan, it is a good idea to ask around and get recommendations. You will then need to call the office and see if the doctor you have chosen will accept you as a new patient and what types of options they offer for patients who are self-pay. Some doctors offer discounts or will set up payment plans for their patients who do not have insurance, but you will need to ask so you aren't surprised when you get to your appointment.  2) Contact Your Local Health Department Not all health departments have doctors that can see patients for sick visits, but many do, so it is worth a call to see if yours does. If you don't know where your local health department is, you can check in your phone book. The CDC also has a tool to help you locate your state's health department, and many state websites also have listings of all of their local health departments.  3) Find a [facility] If your illness is not likely to be very severe or complicated, you may  want to try a walk in clinic. These are popping up all over the country in pharmacies, drugstores, and shopping centers. They're usually staffed by nurse practitioners or physician assistants that have been trained to treat common illnesses and complaints. They're usually fairly quick and inexpensive. However, if you have serious medical issues  or chronic medical problems, these are probably not your best option.  No Primary Care Doctor: - Call Health Connect at  [phone] - they can help you locate a primary care doctor that  accepts your insurance, provides certain services, etc. - Physician Referral Service- [phone]  Chronic Pain Problems: Organization         Address  Phone   Notes  [facility]  [phone] Patients need to be referred by their primary care doctor.   Medication Assistance: Organization         Address  Phone   Notes  [place] [facility] [address] [place], [place] [address] [phone] --Must be a resident of [facility] -- Must have NO insurance coverage whatsoever (no Medicaid/ Medicare, etc.) -- The pt. MUST have a primary care doctor that directs their care regularly and follows them in the community   MedAssist  [phone]   [employer]  [phone]    Agencies that provide inexpensive medical care: Organization         Address  Phone   Notes  [place]  [phone]   [facility]    [phone]   [facility] [place], [place] [address] [phone]   [place] [address] [place] [phone]   Planned Parenthood    [phone]   [facility]    [phone]   [place] [address], [place] Phone:  [phone], Fax:  [phone] Hours of Operation:  9 am - 6 pm, M-F.  Also accepts Medicaid/Medicare and self-pay.  [facility] for [place] [address], [address], [place] Phone: [phone], Fax: [phone]. Hours of Operation:  8:30 am - 5:30 pm, M-F.  Also accepts Medicaid and self-pay.  [facility] [place] [address] [place] Phone: [phone]   [place], [place], [place]  [phone], Ext. [phone] Mondays & Thursdays: 7-9 AM.  First 15 patients are seen on a first come, first serve basis.    [place] Providers:  Organization         Address  Phone   Notes  [facility] [address], [place] [phone] Also accepts self-pay patients.  [facility] [address], [place]  [phone]   [place], [address], [place] [phone]   [facility] [address], [place] [phone]   Lucianne Lei [address], [place]   [phone] Only accepts [place] Access [place] patients after they have their name applied to their card.   Self-Pay (no insurance) in [facility]:  Organization         Address  Phone   Notes  Sickle Cell Patients, [facility] [place] [phone]   [facility] [place] [phone])  [facility] [place]  [place], [address], [place] [phone]   [facility]/Dr. Osei-Bonsu  [address], [place] or [address], [place] [phone] Phone number for both [place] and [place] locations is the same.  Urgent Medical and [facility] [address] [place] [phone]   [facility] [address], [place] or [address] [phone]   [facility] [address] [place] [phone], phone; [phone], fax Sees patients 1st and 3rd Saturday of every month.  Must not qualify for public or private insurance (i.e. Medicaid, Medicare, [place] Health Choice, Veterans' Benefits)  Household income should be no more than 200% of the poverty level The clinic cannot treat you if you are pregnant or think you are pregnant  Sexually transmitted  diseases are not treated at the clinic.    Dental Care: Organization         Address  Phone  Notes  [facility] Department of [facility] [phone] Accepts children up to age 24 who are enrolled in [place] or [place]; pregnant women with a Medicaid card; and children who have applied for Medicaid or [place] Health Choice, but were declined, whose parents can pay a reduced fee at time of service.  [facility] Department of [facility]  [address], [place] [phone] Accepts children up to age 37 who are enrolled in [place] or [place]; pregnant women with a Medicaid card; and children who have applied for Medicaid or [place] Health Choice, but were declined, whose parents can pay a reduced fee at time of service.  [place] Adult Dental Access PROGRAM  [place] [phone] Patients are seen by appointment only. Walk-ins are not accepted. [place] will see patients 25 years of age and older. Monday - Tuesday (8am-5pm) Most Wednesdays (8:30-5pm) $30 per visit, cash only  [facility] Adult Dental Access PROGRAM  [address], [facility] [phone] Patients are seen by appointment only. Walk-ins are not accepted. [place] will see patients 2 years of age and older. One Wednesday Evening (Monthly: Volunteer Based).  $30 per visit, cash only  [place]  [phone] for adults; Children under age 64, call Graduate Pediatric Dentistry at [phone]. Children aged 80-14, please call [phone] to request a pediatric application.  Dental services are provided in all areas of dental care including fillings, crowns and bridges, complete and partial dentures, implants, gum treatment, root canals, and extractions. Preventive care is also provided. Treatment is provided to both adults and children. Patients are selected via a  lottery and there is often a waiting list.   [facility] [place] [address] [place]  [phone] [email or web address]   [facility] [address] [place], [place] [phone], Ext. [phone] Second and Fourth Thursday of each month, opens at 6:30 AM; Clinic ends at 9 AM.  Patients are seen on a first-come first-served basis, and a limited number are seen during each clinic.   [facility]  [address] Hillard Danker [place], [place] [phone]   Eligibility Requirements You must have lived in [place], [place], or [place] counties for at least the last three months.   You cannot be eligible for state  or Museum/gallery conservator, including [employer], [place], or [employer].   You generally cannot be eligible for healthcare insurance through your employer.    How to apply: Eligibility screenings are held every Tuesday and Wednesday afternoon from 1:00 pm until 4:00 pm. You do not need an appointment for the interview!  [facility] [address] [employer], [place]   [place]  [place] Department  [place]  [phone]    Behavioral Health Resources in the Community: Intensive Outpatient Programs Organization         Address  Phone  Notes  [facility]. [address] [place], [place] [phone]   [facility] Outpatient [address], [place], [place]   ADS: Alcohol & Drug Svcs [name] [place], [place]   [place] [address],  [place], [place] or [phone]   Substance Abuse Resources Organization         Address  Phone  Notes  Alcohol and Drug Services  [phone]   [place]  [phone]   [facility] [place]   Chinita Pester  [phone]   Residential &  Outpatient Substance Abuse Program  [phone]   Psychological Services Organization         Address  Phone  Notes  [facility] [place]  [place]  [phone]   [place] [address], [place] or [phone]    Mobile Crisis Teams Organization         Address  Phone  Notes  [facility], Mobile Crisis Care Unit  [phone]   [facility]  [address] [place], [place]   [place] [address] [place] [phone]    Self-Help/Support Groups Organization         Address  Phone             Notes  [place]. of [place] - variety of support groups  [place] Call for more information  Narcotics Anonymous (NA), [facility] [address], [employer] [place]  2 meetings at this location   Special educational needs teacher         Address  Phone  Notes  [facility] Residential Treatment [place],    [place]  [phone]   [facility]  [address] [place] [phone], [place], [place]   [place], [place] [phone] Admissions: 8am-3pm M-F  Incentives Substance [place] [address]    [place], [place] [phone]   [facility] [address] Jadene Pierini [place], [place]   [name] [address]  [place], [place]   [facility] Programs - Intensive Outpatient [address], [place], [place]   [facility] ([place].) [address]  [place], [place] [phone] or [phone]   [facility] (RT[facility]) [address] [place], [place] Accepts Medicaid  [facility] [place] [address]  [place] [phone] Substance Abuse/Addiction Treatment   [facility] Organization          Address  Phone  Notes  [facility]  [phone]   Domenic Schwab, PhD [place], [address] [place], [place]   [phone]  [phone] or [phone]) [phone]   [facility]   [address] [place], [place] [phone]   [employer] [address], [place], [place] [phone] Insurance/Medicaid/sponsorship through [facility] and Families [address]                                    [place], [place] [phone] [place] [address]   [place], [place] [phone]    Dr. Adele Schilder  [phone]   [facility] Dept. 1) [address], [place] 2) [place] 3)  [place] [address], [place] [phone]  [phone]   [place] [phone] or [phone] (After Hours)

## 2014-12-16 ENCOUNTER — Other Ambulatory Visit: Payer: Self-pay | Admitting: Family Medicine

## 2014-12-21 ENCOUNTER — Encounter (HOSPITAL_COMMUNITY): Payer: Self-pay | Admitting: *Deleted

## 2014-12-21 ENCOUNTER — Emergency Department (HOSPITAL_COMMUNITY): Payer: Commercial Managed Care - HMO

## 2014-12-21 ENCOUNTER — Observation Stay (HOSPITAL_COMMUNITY)
Admission: EM | Admit: 2014-12-21 | Discharge: 2014-12-22 | Disposition: A | Discharge status: Home / Self Care | Payer: Commercial Managed Care - HMO | Attending: Family Medicine | Admitting: Family Medicine

## 2014-12-21 DIAGNOSIS — Z7982 Long term (current) use of aspirin: Secondary | ICD-10-CM | POA: Insufficient documentation

## 2014-12-21 DIAGNOSIS — K219 Gastro-esophageal reflux disease without esophagitis: Secondary | ICD-10-CM | POA: Insufficient documentation

## 2014-12-21 DIAGNOSIS — M3390 Dermatopolymyositis, unspecified, organ involvement unspecified: Secondary | ICD-10-CM | POA: Diagnosis not present

## 2014-12-21 DIAGNOSIS — K648 Other hemorrhoids: Secondary | ICD-10-CM | POA: Insufficient documentation

## 2014-12-21 DIAGNOSIS — I10 Essential (primary) hypertension: Secondary | ICD-10-CM | POA: Diagnosis not present

## 2014-12-21 DIAGNOSIS — E119 Type 2 diabetes mellitus without complications: Secondary | ICD-10-CM | POA: Insufficient documentation

## 2014-12-21 DIAGNOSIS — J159 Unspecified bacterial pneumonia: Principal | ICD-10-CM | POA: Insufficient documentation

## 2014-12-21 DIAGNOSIS — K449 Diaphragmatic hernia without obstruction or gangrene: Secondary | ICD-10-CM | POA: Insufficient documentation

## 2014-12-21 DIAGNOSIS — K5730 Diverticulosis of large intestine without perforation or abscess without bleeding: Secondary | ICD-10-CM | POA: Diagnosis not present

## 2014-12-21 DIAGNOSIS — F429 Obsessive-compulsive disorder, unspecified: Secondary | ICD-10-CM | POA: Insufficient documentation

## 2014-12-21 DIAGNOSIS — N200 Calculus of kidney: Secondary | ICD-10-CM | POA: Diagnosis not present

## 2014-12-21 DIAGNOSIS — J189 Pneumonia, unspecified organism: Secondary | ICD-10-CM

## 2014-12-21 DIAGNOSIS — R509 Fever, unspecified: Secondary | ICD-10-CM

## 2014-12-21 DIAGNOSIS — E785 Hyperlipidemia, unspecified: Secondary | ICD-10-CM | POA: Insufficient documentation

## 2014-12-21 DIAGNOSIS — Z880 Allergy status to penicillin: Secondary | ICD-10-CM | POA: Diagnosis not present

## 2014-12-21 DIAGNOSIS — Z87891 Personal history of nicotine dependence: Secondary | ICD-10-CM | POA: Insufficient documentation

## 2014-12-21 DIAGNOSIS — M359 Systemic involvement of connective tissue, unspecified: Secondary | ICD-10-CM | POA: Insufficient documentation

## 2014-12-21 DIAGNOSIS — J441 Chronic obstructive pulmonary disease with (acute) exacerbation: Secondary | ICD-10-CM | POA: Diagnosis not present

## 2014-12-21 DIAGNOSIS — D649 Anemia, unspecified: Secondary | ICD-10-CM | POA: Insufficient documentation

## 2014-12-21 DIAGNOSIS — N289 Disorder of kidney and ureter, unspecified: Secondary | ICD-10-CM | POA: Diagnosis not present

## 2014-12-21 DIAGNOSIS — I7300 Raynaud's syndrome without gangrene: Secondary | ICD-10-CM | POA: Diagnosis not present

## 2014-12-21 DIAGNOSIS — M797 Fibromyalgia: Secondary | ICD-10-CM | POA: Diagnosis not present

## 2014-12-21 DIAGNOSIS — E669 Obesity, unspecified: Secondary | ICD-10-CM | POA: Diagnosis not present

## 2014-12-21 DIAGNOSIS — I259 Chronic ischemic heart disease, unspecified: Secondary | ICD-10-CM | POA: Insufficient documentation

## 2014-12-21 DIAGNOSIS — I252 Old myocardial infarction: Secondary | ICD-10-CM | POA: Insufficient documentation

## 2014-12-21 DIAGNOSIS — I609 Nontraumatic subarachnoid hemorrhage, unspecified: Secondary | ICD-10-CM | POA: Diagnosis not present

## 2014-12-21 DIAGNOSIS — F419 Anxiety disorder, unspecified: Secondary | ICD-10-CM | POA: Insufficient documentation

## 2014-12-21 DIAGNOSIS — I4891 Unspecified atrial fibrillation: Secondary | ICD-10-CM | POA: Diagnosis not present

## 2014-12-21 DIAGNOSIS — K2970 Gastritis, unspecified, without bleeding: Secondary | ICD-10-CM | POA: Diagnosis not present

## 2014-12-21 DIAGNOSIS — G629 Polyneuropathy, unspecified: Secondary | ICD-10-CM | POA: Insufficient documentation

## 2014-12-21 DIAGNOSIS — F329 Major depressive disorder, single episode, unspecified: Secondary | ICD-10-CM | POA: Diagnosis not present

## 2014-12-21 DIAGNOSIS — Z79899 Other long term (current) drug therapy: Secondary | ICD-10-CM | POA: Insufficient documentation

## 2014-12-21 DIAGNOSIS — M069 Rheumatoid arthritis, unspecified: Secondary | ICD-10-CM | POA: Insufficient documentation

## 2014-12-21 LAB — COMPREHENSIVE METABOLIC PANEL
ALT: 14 U/L — ABNORMAL LOW (ref 17–63)
AST: 20 U/L (ref 15–41)
Albumin: 3.4 g/dL — ABNORMAL LOW (ref 3.5–5.0)
Alkaline Phosphatase: 75 U/L (ref 38–126)
Anion gap: 9 (ref 5–15)
BUN: 8 mg/dL (ref 6–20)
CHLORIDE: 106 mmol/L (ref 101–111)
CO2: 26 mmol/L (ref 22–32)
CREATININE: 1.1 mg/dL (ref 0.61–1.24)
Calcium: 8.8 mg/dL — ABNORMAL LOW (ref 8.9–10.3)
Glucose, Bld: 92 mg/dL (ref 65–99)
POTASSIUM: 3.5 mmol/L (ref 3.5–5.1)
Sodium: 141 mmol/L (ref 135–145)
Total Bilirubin: 0.8 mg/dL (ref 0.3–1.2)
Total Protein: 6.4 g/dL — ABNORMAL LOW (ref 6.5–8.1)

## 2014-12-21 LAB — CBC WITH DIFFERENTIAL/PLATELET
Basophils Absolute: 0 10*3/uL (ref 0.0–0.1)
Basophils Relative: 0 %
EOS PCT: 3 %
Eosinophils Absolute: 0.2 10*3/uL (ref 0.0–0.7)
HCT: 43.1 % (ref 39.0–52.0)
HEMOGLOBIN: 14.1 g/dL (ref 13.0–17.0)
LYMPHS ABS: 1.6 10*3/uL (ref 0.7–4.0)
LYMPHS PCT: 19 %
MCH: 29.9 pg (ref 26.0–34.0)
MCHC: 32.7 g/dL (ref 30.0–36.0)
MCV: 91.3 fL (ref 78.0–100.0)
MONOS PCT: 11 %
Monocytes Absolute: 0.9 10*3/uL (ref 0.1–1.0)
NEUTROS PCT: 67 %
Neutro Abs: 5.3 10*3/uL (ref 1.7–7.7)
Platelets: 255 10*3/uL (ref 150–400)
RBC: 4.72 MIL/uL (ref 4.22–5.81)
RDW: 14.7 % (ref 11.5–15.5)
WBC: 8 10*3/uL (ref 4.0–10.5)

## 2014-12-21 LAB — URINALYSIS, ROUTINE W REFLEX MICROSCOPIC
GLUCOSE, UA: NEGATIVE mg/dL
Hgb urine dipstick: NEGATIVE
Ketones, ur: NEGATIVE mg/dL
LEUKOCYTES UA: NEGATIVE
Nitrite: NEGATIVE
PH: 6.5 (ref 5.0–8.0)
Protein, ur: NEGATIVE mg/dL
SPECIFIC GRAVITY, URINE: 1.025 (ref 1.005–1.030)
Urobilinogen, UA: 1 mg/dL (ref 0.0–1.0)

## 2014-12-21 LAB — I-STAT CG4 LACTIC ACID, ED: LACTIC ACID, VENOUS: 1.44 mmol/L (ref 0.5–2.0)

## 2014-12-21 MED ORDER — IPRATROPIUM BROMIDE 0.02 % IN SOLN
0.5000 mg | Freq: Once | RESPIRATORY_TRACT | Status: AC
  Administered 2014-12-21: 0.5 mg via RESPIRATORY_TRACT
  Filled 2014-12-21: qty 2.5

## 2014-12-21 MED ORDER — ALBUTEROL SULFATE (2.5 MG/3ML) 0.083% IN NEBU
5.0000 mg | INHALATION_SOLUTION | Freq: Once | RESPIRATORY_TRACT | Status: AC
  Administered 2014-12-21: 5 mg via RESPIRATORY_TRACT
  Filled 2014-12-21: qty 6

## 2014-12-21 MED ORDER — PREDNISONE 20 MG PO TABS
60.0000 mg | ORAL_TABLET | Freq: Once | ORAL | Status: DC
  Filled 2014-12-21: qty 3

## 2014-12-21 NOTE — ED Provider Notes (Signed)
CSN: [id number]     Arrival date & time 12/21/14  2059 History   First MD Initiated Contact with Patient 12/21/14 2133     Chief Complaint  Patient presents with  . Shortness of Breath  . Fever     (Consider location/radiation/quality/duration/timing/severity/associated sxs/prior Treatment) Patient is a 64 y.o. male presenting with cough. The history is provided by the patient and medical records. The history is limited by the condition of the patient.  Cough Cough characteristics:  Productive Sputum characteristics:  Nondescript Severity:  Moderate Onset quality:  Gradual Duration:  1 week Timing:  Constant Progression:  Worsening Chronicity:  Recurrent Context: upper respiratory infection and with activity   Relieved by:  Nothing Worsened by:  Activity Ineffective treatments:  Rest and beta-agonist inhaler Associated symptoms: chest pain, chills, fever, headaches, shortness of breath, sore throat and wheezing   Associated symptoms: no eye discharge, no myalgias, no rash and no rhinorrhea   Risk factors: recent infection     Past Medical History  Diagnosis Date  . Rheumatoid arthritis(714.0)   . Obesity   . Major depression ([place])     with acute psychotic break in 06/2010  . Hypertension   . Hyperlipidemia   . Diverticulosis of colon   . COPD (chronic obstructive pulmonary disease) ([place])   . Anxiety   . GERD (gastroesophageal reflux disease)   . Vertigo   . Fibromyalgia   . Dermatomyositis ([place])   . Myocardial infarct ([place])     mulitple (1999, 2000, 2004)  . Raynaud's disease   . Narcotic dependence ([place])   . Peripheral neuropathy ([place])   . Internal hemorrhoids   . Ischemic heart disease   . Hiatal hernia   . Gastritis   . Diverticulitis   . Hx of adenomatous colonic polyps   . Nephrolithiasis   . Anemia   . Esophageal stricture   . Esophageal dysmotility   . Dermatomyositis ([place])   . Urge incontinence   . Otosclerosis   . Bipolar 1 disorder ([place])   .  OCD (obsessive compulsive disorder)   . Sarcoidosis ([place])   . Paroxysmal a-fib ([place])   . Dysrhythmia     "irregular" (11/15/2012)  . Type II diabetes mellitus ([place])   . Seizures ([place])   . Headache(784.0)     "severe; get them often" (11/15/2012)  . Subarachnoid hemorrhage ([place]) 01/2012    with subdural  hematoma.   . Atrial fibrillation ([place]) 01/2012    with RVR  . Conversion disorder 06/2010  . History of narcotic addiction ([place])   . Asthma   . Cancer ([place])   . Collagen vascular disease ([place])   . Renal insufficiency    Past Surgical History  Procedure Laterality Date  . Knee arthroscopy w/ meniscal repair Left 2009  . Lumbar disc surgery    . Squamous papilloma   2010    removed by Dr. Constance Holster ENT, noted on tongue  . Esophagogastroduodenoscopy N/A 09/27/2012    Procedure: ESOPHAGOGASTRODUODENOSCOPY (EGD);  Surgeon: Lafayette Dragon, MD;  Location: [facility];  Service: Endoscopy;  Laterality: N/A;  . Colonoscopy N/A 09/27/2012    Procedure: COLONOSCOPY;  Surgeon: Lafayette Dragon, MD;  Location: [facility];  Service: Endoscopy;  Laterality: N/A;  . Cataract extraction w/ intraocular lens implant Left   . Lymph node dissection Right 1970's    "neck; dr thought I had Hodgkins; turned out to be sarcoidosis" (11/15/2012)  . Tonsillectomy    . Carpal tunnel release Bilateral   .  Cardiac catheterization    . Back surgery     Family History  Problem Relation Age of Onset  . Alcohol abuse Mother   . Depression Mother   . Heart disease Mother   . Diabetes Mother   . Stroke Mother   . Diabetes Other     1/2 brother  . Hepatitis Brother   . Coronary artery disease Father    Social History  Substance Use Topics  . Smoking status: Former Smoker -- 0.50 packs/day for 30 years    Types: Cigarettes    Quit date: 12/15/2014  . Smokeless tobacco: Never Used     Comment: 3 cigs a day  . Alcohol Use: No     Comment: Alcohol stopped in September of 2014    Review of Systems   Constitutional: Positive for fever and chills.  HENT: Positive for sore throat. Negative for facial swelling and rhinorrhea.   Eyes: Negative for discharge.  Respiratory: Positive for cough, chest tightness, shortness of breath and wheezing.   Cardiovascular: Positive for chest pain. Negative for palpitations and leg swelling.  Gastrointestinal: Negative for abdominal pain.  Genitourinary: Negative for dysuria.  Musculoskeletal: Negative for myalgias and back pain.  Skin: Negative for rash.  Neurological: Positive for headaches.  Psychiatric/Behavioral: Negative for confusion.      Allergies  Immune globulins; Ciprofloxacin; Flagyl; Lisinopril; Sulfa antibiotics; Betamethasone dipropionate; Bupropion hcl; Clobetasol; Codeine; Escitalopram oxalate; Fluoxetine hcl; Fluticasone-salmeterol; Furosemide; Paroxetine; Penicillins; Tacrolimus; Tetanus toxoid; and Tuberculin purified protein derivative  Home Medications   Prior to Admission medications   Medication Sig Start Date End Date Taking? Authorizing Provider  albuterol-ipratropium (COMBIVENT) 18-103 MCG/ACT inhaler Inhale 1 puff into the lungs every 6 (six) hours as needed for wheezing or shortness of breath. 12/03/14  Yes Patrecia Pour, MD  amiodarone (PACERONE) 200 MG tablet Take 0.5 tablets (100 mg total) by mouth daily. 11/20/14  Yes Fay Records, MD  aspirin EC 81 MG tablet Take 81 mg by mouth daily.   Yes Historical Provider, MD  atorvastatin (LIPITOR) 40 MG tablet Take 1 tablet (40 mg total) by mouth daily. 11/11/14  Yes Fay Records, MD  divalproex (DEPAKOTE ER) 500 MG 24 hr tablet Take 1 tablet (500 mg total) by mouth daily. 07/30/14  Yes Renee A Kuneff, DO  guaiFENesin (MUCINEX) 600 MG 12 hr tablet Take 600 mg by mouth 2 (two) times daily. OTC   Yes Historical Provider, MD  levETIRAcetam (KEPPRA) 500 MG tablet TAKE 1 TABLET(500 MG) BY MOUTH TWICE DAILY 07/31/14  Yes Renee A Kuneff, DO  loperamide (IMODIUM) 2 MG capsule TAKE 1 TO 2  CAPSULES BY MOUTH 3 TIMES A DAY AS NEEDED FOR DIARRHEA 11/15/14  Yes Patrecia Pour, MD  meclizine (ANTIVERT) 25 MG tablet TAKE 1 TABLET (25 MG TOTAL) BY MOUTH 3 (THREE) TIMES DAILY AS NEEDED. 11/15/14  Yes Historical Provider, MD  Multiple Vitamin (MULTIVITAMIN WITH MINERALS) TABS tablet Take 1 tablet by mouth daily.   Yes Historical Provider, MD  NEXIUM 40 MG capsule TAKE ONE CAPSULE BY MOUTH EVERY MORNING WITH BREAKFAST 03/07/14  Yes Renee A Kuneff, DO  oxybutynin (DITROPAN) 5 MG tablet TAKE 1 TABLET (5 MG TOTAL) BY MOUTH 3 (THREE) TIMES DAILY. 12/17/14  Yes Patrecia Pour, MD  Potassium Chloride ER 20 MEQ TBCR Take 1 tablet by mouth every other day.  11/29/14  Yes Historical Provider, MD  rOPINIRole (REQUIP) 2 MG tablet Take 0.5 tablets (1 mg total) by mouth at bedtime.  12/03/14  Yes Patrecia Pour, MD  doxycycline (VIBRA-TABS) 100 MG tablet Take 1 tablet (100 mg total) by mouth every 12 (twelve) hours. 12/22/14   Elberta Leatherwood, MD  predniSONE (DELTASONE) 50 MG tablet Take 1 tablet (50 mg total) by mouth daily with breakfast. 12/22/14   Elberta Leatherwood, MD   BP 150/88 mmHg  Pulse 76  Temp(Src) 100.2 F (37.9 C) (Oral)  Resp 16  Ht 5\' 10"  (1.778 m)  Wt 215 lb 12.8 oz (97.886 kg)  BMI 30.96 kg/m2  SpO2 95% Physical Exam  Constitutional: He is oriented to person, place, and time. He appears well-developed and well-nourished. No distress.  HENT:  Head: Normocephalic and atraumatic.  Right Ear: External ear normal.  Left Ear: External ear normal.  Nose: Nose normal.  Mouth/Throat: Oropharynx is clear and moist. No oropharyngeal exudate.  Eyes: Conjunctivae and EOM are normal. Pupils are equal, round, and reactive to light. Right eye exhibits no discharge. Left eye exhibits no discharge. No scleral icterus.  Neck: Normal range of motion. Neck supple. No JVD present. No tracheal deviation present. No thyromegaly present.  Cardiovascular: Normal rate, regular rhythm and intact distal pulses.    Pulmonary/Chest: Effort normal. No stridor. No respiratory distress. He has wheezes.  Abdominal: Soft. He exhibits no distension. There is no tenderness.  Musculoskeletal: Normal range of motion. He exhibits no edema or tenderness.  Lymphadenopathy:    He has no cervical adenopathy.  Neurological: He is alert and oriented to person, place, and time. He displays no atrophy and no tremor. No cranial nerve deficit or sensory deficit. He exhibits normal muscle tone. He displays no seizure activity. GCS eye subscore is 4. GCS verbal subscore is 5. GCS motor subscore is 6.  Skin: Skin is warm and dry. No rash noted. He is not diaphoretic. No erythema. No pallor.  Psychiatric: His mood appears anxious. His speech is tangential. He is hyperactive. Thought content is paranoid and delusional. Cognition and memory are normal. He expresses impulsivity and inappropriate judgment.  Nursing note and vitals reviewed.   ED Course  Procedures (including critical care time) Labs Review Labs Reviewed  COMPREHENSIVE METABOLIC PANEL - Abnormal; Notable for the following:    Calcium 8.8 (*)    Total Protein 6.4 (*)    Albumin 3.4 (*)    ALT 14 (*)    All other components within normal limits  URINALYSIS, ROUTINE W REFLEX MICROSCOPIC (NOT AT [facility]) - Abnormal; Notable for the following:    Color, Urine AMBER (*)    APPearance CLOUDY (*)    Bilirubin Urine SMALL (*)    All other components within normal limits  BASIC METABOLIC PANEL - Abnormal; Notable for the following:    Potassium 3.2 (*)    Glucose, Bld 126 (*)    All other components within normal limits  CULTURE, BLOOD (ROUTINE X 2)  CULTURE, BLOOD (ROUTINE X 2)  URINE CULTURE  CBC WITH DIFFERENTIAL/PLATELET  TROPONIN I  TROPONIN I  CBC  HEMOGLOBIN A1C  I-STAT CG4 LACTIC ACID, ED    Imaging Review Dg Chest Port 1 View  12/21/2014  CLINICAL DATA:  Dyspnea cough and fever for 2 days. EXAM: PORTABLE CHEST 1 VIEW COMPARISON:  10/24/2014  FINDINGS: Two AP portable views of the chest demonstrates no focal airspace consolidation or alveolar edema. The lungs are grossly clear. There is no large effusion or pneumothorax. Cardiac and mediastinal contours appear unremarkable. IMPRESSION: No active disease. Electronically Signed   By:  Andreas Newport M.D.   On: 12/21/2014 21:44   I have personally reviewed and evaluated these images and lab results as part of my medical decision-making.   EKG Interpretation   Date/Time:  Saturday December 21 2014 21:07:15 EDT Ventricular Rate:  78 PR Interval:  171 QRS Duration: 88 QT Interval:  400 QTC Calculation: 456 R Axis:   29 Text Interpretation:  Sinus rhythm Borderline repolarization abnormality  Baseline wander in lead(s) I II aVR aVL artifact noted Abnormal ekg  Confirmed by Christy Gentles  MD, Elenore Rota ([id number]) on 12/21/2014 9:09:57 PM      MDM   Final diagnoses:  Fever in adult  CAP (community acquired pneumonia)    Pt with hx of COPD with cough, worsening SOB, not improved with home meds.  Pt endorsing subjective fevers.  Multiple co-morbid medical conditions.  Pt initially improved with inhaled medications.  Pt initially refused steroids because of concern for weight gain, and other adverse medication symptoms.  Pt required significant explanation of risk/benefits of PO steroids in order to agree to proceed with COPD Tx.  Pt has a hx of mental illness and appears very preoccupied with the possibility of a poor reaction to his medications.  Pt was ambulated, but had subsequent desaturation events and developed AF w/RVR.  Pt on amiodarone, and states that he took his dose today.  Temp increased to 100.9 Rectal, and suspect that worsening fever is contributing to his AF w/RVR.   Also appears dehydrated.  Family medicine consulted for admission.  Will treat as CAP in setting of fever.  Pt was admitted to family medicine for further management.    Patient care was discussed with my attending,  Dr. Christy Gentles.     Hoyle Sauer, MD 12/23/14 1914  Ripley Fraise, MD 12/23/14 [phone]

## 2014-12-21 NOTE — ED Notes (Signed)
Pt. C/o SOB and fever. Called EMS. Pt. hot to touch and having difficulty breathing. Fever on arrival 100.2 orally. Pt. Usually on Room air but O2 sats in the 80s. Pt. Placed on 2L [place] SPO2 now 97%.

## 2014-12-22 ENCOUNTER — Encounter (HOSPITAL_COMMUNITY): Payer: Self-pay | Admitting: *Deleted

## 2014-12-22 DIAGNOSIS — J441 Chronic obstructive pulmonary disease with (acute) exacerbation: Secondary | ICD-10-CM | POA: Diagnosis not present

## 2014-12-22 DIAGNOSIS — J159 Unspecified bacterial pneumonia: Secondary | ICD-10-CM | POA: Diagnosis not present

## 2014-12-22 DIAGNOSIS — F329 Major depressive disorder, single episode, unspecified: Secondary | ICD-10-CM | POA: Diagnosis not present

## 2014-12-22 DIAGNOSIS — J189 Pneumonia, unspecified organism: Secondary | ICD-10-CM | POA: Diagnosis present

## 2014-12-22 DIAGNOSIS — E669 Obesity, unspecified: Secondary | ICD-10-CM | POA: Diagnosis not present

## 2014-12-22 DIAGNOSIS — M069 Rheumatoid arthritis, unspecified: Secondary | ICD-10-CM | POA: Diagnosis not present

## 2014-12-22 LAB — BASIC METABOLIC PANEL
Anion gap: 11 (ref 5–15)
BUN: 9 mg/dL (ref 6–20)
CHLORIDE: 101 mmol/L (ref 101–111)
CO2: 26 mmol/L (ref 22–32)
CREATININE: 1.11 mg/dL (ref 0.61–1.24)
Calcium: 8.9 mg/dL (ref 8.9–10.3)
GFR calc Af Amer: >60 mL/min (ref >60)
GFR calc non Af Amer: >60 mL/min (ref >60)
Glucose, Bld: 126 mg/dL — ABNORMAL HIGH (ref 65–99)
POTASSIUM: 3.2 mmol/L — AB (ref 3.5–5.1)
Sodium: 138 mmol/L (ref 135–145)

## 2014-12-22 LAB — TROPONIN I

## 2014-12-22 LAB — CBC
HEMATOCRIT: 42.5 % (ref 39.0–52.0)
HEMOGLOBIN: 14 g/dL (ref 13.0–17.0)
MCH: 30 pg (ref 26.0–34.0)
MCHC: 32.9 g/dL (ref 30.0–36.0)
MCV: 91 fL (ref 78.0–100.0)
Platelets: 252 10*3/uL (ref 150–400)
RBC: 4.67 MIL/uL (ref 4.22–5.81)
RDW: 14.9 % (ref 11.5–15.5)
WBC: 6.5 10*3/uL (ref 4.0–10.5)

## 2014-12-22 MED ORDER — SODIUM CHLORIDE 0.9 % IJ SOLN
3.0000 mL | Freq: Two times a day (BID) | INTRAMUSCULAR | Status: DC
  Administered 2014-12-22: 3 mL via INTRAVENOUS

## 2014-12-22 MED ORDER — DIVALPROEX SODIUM ER 500 MG PO TB24
500.0000 mg | ORAL_TABLET | Freq: Every day | ORAL | Status: DC
  Administered 2014-12-22: 500 mg via ORAL
  Filled 2014-12-22: qty 1

## 2014-12-22 MED ORDER — ASPIRIN EC 81 MG PO TBEC
81.0000 mg | DELAYED_RELEASE_TABLET | Freq: Every day | ORAL | Status: DC
  Administered 2014-12-22: 81 mg via ORAL
  Filled 2014-12-22: qty 1

## 2014-12-22 MED ORDER — IPRATROPIUM-ALBUTEROL 0.5-2.5 (3) MG/3ML IN SOLN
3.0000 mL | RESPIRATORY_TRACT | Status: DC
  Administered 2014-12-22 (×3): 3 mL via RESPIRATORY_TRACT
  Filled 2014-12-22 (×4): qty 3

## 2014-12-22 MED ORDER — POTASSIUM CHLORIDE ER 20 MEQ PO TBCR: 1.0000 | EXTENDED_RELEASE_TABLET | ORAL | Status: DC

## 2014-12-22 MED ORDER — SODIUM CHLORIDE 0.9 % IV SOLN
INTRAVENOUS | Status: DC
  Administered 2014-12-22: 05:00:00 via INTRAVENOUS

## 2014-12-22 MED ORDER — OXYBUTYNIN CHLORIDE 5 MG PO TABS: 5.0000 mg | ORAL_TABLET | Freq: Three times a day (TID) | ORAL | Status: DC | PRN

## 2014-12-22 MED ORDER — ACETAMINOPHEN 650 MG RE SUPP: 650.0000 mg | Freq: Four times a day (QID) | RECTAL | Status: DC | PRN

## 2014-12-22 MED ORDER — ATORVASTATIN CALCIUM 40 MG PO TABS
40.0000 mg | ORAL_TABLET | Freq: Every day | ORAL | Status: DC
  Administered 2014-12-22: 40 mg via ORAL
  Filled 2014-12-22: qty 1

## 2014-12-22 MED ORDER — DEXTROSE 5 % IV SOLN
1.0000 g | Freq: Once | INTRAVENOUS | Status: AC
  Administered 2014-12-22: 1 g via INTRAVENOUS
  Filled 2014-12-22: qty 10

## 2014-12-22 MED ORDER — AMIODARONE HCL 100 MG PO TABS
100.0000 mg | ORAL_TABLET | Freq: Every day | ORAL | Status: DC
  Administered 2014-12-22: 100 mg via ORAL
  Filled 2014-12-22: qty 1

## 2014-12-22 MED ORDER — AZITHROMYCIN 500 MG IV SOLR
500.0000 mg | Freq: Once | INTRAVENOUS | Status: AC
  Administered 2014-12-22: 500 mg via INTRAVENOUS
  Filled 2014-12-22: qty 500

## 2014-12-22 MED ORDER — ADULT MULTIVITAMIN W/MINERALS CH
1.0000 | ORAL_TABLET | Freq: Every day | ORAL | Status: DC
  Administered 2014-12-22: 1 via ORAL
  Filled 2014-12-22: qty 1

## 2014-12-22 MED ORDER — DOXYCYCLINE HYCLATE 100 MG PO TABS: 100.0000 mg | ORAL_TABLET | Freq: Two times a day (BID) | ORAL | Status: DC

## 2014-12-22 MED ORDER — LEVETIRACETAM 500 MG PO TABS
500.0000 mg | ORAL_TABLET | Freq: Two times a day (BID) | ORAL | Status: DC
  Administered 2014-12-22: 500 mg via ORAL
  Filled 2014-12-22: qty 1

## 2014-12-22 MED ORDER — SODIUM CHLORIDE 0.9 % IV BOLUS (SEPSIS)
1000.0000 mL | Freq: Once | INTRAVENOUS | Status: AC
  Administered 2014-12-22: 1000 mL via INTRAVENOUS

## 2014-12-22 MED ORDER — ENOXAPARIN SODIUM 40 MG/0.4ML ~~LOC~~ SOLN
40.0000 mg | SUBCUTANEOUS | Status: DC
Start: 2014-12-22 — End: 2014-12-22
  Administered 2014-12-22: 40 mg via SUBCUTANEOUS
  Filled 2014-12-22: qty 0.4

## 2014-12-22 MED ORDER — PREDNISONE 50 MG PO TABS: 50.0000 mg | ORAL_TABLET | Freq: Every day | ORAL | Status: DC

## 2014-12-22 MED ORDER — GUAIFENESIN ER 600 MG PO TB12: 600.0000 mg | ORAL_TABLET | Freq: Two times a day (BID) | ORAL | Status: DC

## 2014-12-22 MED ORDER — DOXYCYCLINE HYCLATE 100 MG PO TABS
100.0000 mg | ORAL_TABLET | Freq: Two times a day (BID) | ORAL | Status: DC
  Administered 2014-12-22: 100 mg via ORAL
  Filled 2014-12-22: qty 1

## 2014-12-22 MED ORDER — GUAIFENESIN ER 600 MG PO TB12
600.0000 mg | ORAL_TABLET | Freq: Two times a day (BID) | ORAL | Status: DC
  Administered 2014-12-22: 600 mg via ORAL
  Filled 2014-12-22: qty 1

## 2014-12-22 MED ORDER — ROPINIROLE HCL 1 MG PO TABS
1.0000 mg | ORAL_TABLET | Freq: Every day | ORAL | Status: DC
Start: 2014-12-22 — End: 2014-12-22

## 2014-12-22 MED ORDER — POTASSIUM CHLORIDE CRYS ER 20 MEQ PO TBCR
20.0000 meq | EXTENDED_RELEASE_TABLET | ORAL | Status: DC
  Administered 2014-12-22: 20 meq via ORAL
  Filled 2014-12-22: qty 1

## 2014-12-22 MED ORDER — METHYLPREDNISOLONE SODIUM SUCC 125 MG IJ SOLR
125.0000 mg | Freq: Once | INTRAMUSCULAR | Status: AC
  Administered 2014-12-22: 125 mg via INTRAVENOUS
  Filled 2014-12-22: qty 2

## 2014-12-22 MED ORDER — PANTOPRAZOLE SODIUM 40 MG PO TBEC
80.0000 mg | DELAYED_RELEASE_TABLET | Freq: Every day | ORAL | Status: DC
  Administered 2014-12-22: 80 mg via ORAL
  Filled 2014-12-22: qty 2

## 2014-12-22 MED ORDER — LEVETIRACETAM 500 MG PO TABS: 500.0000 mg | ORAL_TABLET | Freq: Two times a day (BID) | ORAL | Status: DC

## 2014-12-22 MED ORDER — ALBUTEROL SULFATE (2.5 MG/3ML) 0.083% IN NEBU: 2.5000 mg | INHALATION_SOLUTION | RESPIRATORY_TRACT | Status: DC | PRN

## 2014-12-22 MED ORDER — ACETAMINOPHEN 325 MG PO TABS
650.0000 mg | ORAL_TABLET | Freq: Four times a day (QID) | ORAL | Status: DC | PRN
  Administered 2014-12-22: 650 mg via ORAL
  Filled 2014-12-22: qty 2

## 2014-12-22 MED ORDER — LOPERAMIDE HCL 2 MG PO CAPS: 2.0000 mg | ORAL_CAPSULE | ORAL | Status: DC | PRN

## 2014-12-22 MED ORDER — ACETAMINOPHEN 325 MG PO TABS
650.0000 mg | ORAL_TABLET | Freq: Once | ORAL | Status: AC
  Administered 2014-12-22: 650 mg via ORAL
  Filled 2014-12-22: qty 2

## 2014-12-22 NOTE — Discharge Summary (Signed)
[facility] Discharge Summary  Patient name: Patrick Holt Medical record number: [id number] Date of birth: Dec 14, 1950 Age: 64 y.o. Gender: male Date of Admission: 12/21/2014  Date of Discharge: 12/22/2014 Admitting Physician: Zenia Resides, MD  Primary Care Provider: Vance Gather, MD Consultants: none  Indication for Hospitalization: Cough and shortness of breath  Discharge Diagnoses/Problem List:  COPD with exacerbation Atypical chest pain Atrial fibrillation  Disposition: Home  Discharge Condition: Stable  Discharge Exam:  General: NAD, on room air, A/O Eyes: EMOI, PERRL HEENT: MMM, no tonsilar exudates, mild erythema of pharynx, PERRL, EOMI Cardiovascular: RRR, no murmur, peripheral pulses intact bilaterally Respiratory: no crackles, no current expiratory wheezing noted MSK: moves extremities equally; no LE edema Skin: no rash, warm, well perfused  Neuro: no gross deficits noted; awake, alert  Brief Hospital Course:  Patient had presented to the ED for a productive cough of one week. On presentation he did noted a subjective fever and shortness of breath 1 day. Some chest pain with coughing. In the ED a Tmax was noted of 100.9. O2 sats duration was were in the 80s on room air. WBC 8.0. Urinalysis benign. Lactic acid 1.44. Creatinine at baseline.   Patient was provided oxygen supplementation, and DuoNeb's. He was started on azithromycin and ceftriaxone for concern for community-acquired pneumonia. While in the ED there was some concern about patient's heart rate. Patient presented initially in normal sinus rhythm but was then noted to be tachycardic. At that time patient had converted to A. fib with RVR. However, patient was back in normal sinus rhythm at the time admitting team had arrived. Troponin negative throughout stay. Patient was admitted for management for a COPD exacerbation.  On admission to the floor IV antibiotics were  discontinued. Doxycycline by mouth was ordered. Patient received IV Solu-Medrol in the ED. Oral prednisone 50 mg was ordered in its stead (to be started 11/7). Patient improved dramatically overnight. No further issues with tachycardia/A. Fib. O2 saturations in the 90s on room air.   Issues for Follow Up:  1. Follow-up blood and urine cultures. 2. Address any issues with long-term maintenance of COPD.  Significant Procedures: None  Significant Labs and Imaging:   Recent Labs Lab 12/21/14 2204 12/22/14 0358  WBC 8.0 6.5  HGB 14.1 14.0  HCT 43.1 42.5  PLT 255 252    Recent Labs Lab 12/21/14 2204 12/22/14 0358  NA 141 138  K 3.5 3.2*  CL 106 101  CO2 26 26  GLUCOSE 92 126*  BUN 8 9  CREATININE 1.10 1.11  CALCIUM 8.8* 8.9  ALKPHOS 75  --   AST 20  --   ALT 14*  --   ALBUMIN 3.4*  --     Results/Tests Pending at Time of Discharge:  Blood culture Urine culture A1c  Discharge Medications:    Medication List    TAKE these medications        albuterol-ipratropium 18-103 MCG/ACT inhaler  Commonly known as:  COMBIVENT  Inhale 1 puff into the lungs every 6 (six) hours as needed for wheezing or shortness of breath.     amiodarone 200 MG tablet  Commonly known as:  PACERONE  Take 0.5 tablets (100 mg total) by mouth daily.     aspirin EC 81 MG tablet  Take 81 mg by mouth daily.     atorvastatin 40 MG tablet  Commonly known as:  LIPITOR  Take 1 tablet (40 mg total) by mouth daily.  divalproex 500 MG 24 hr tablet  Commonly known as:  DEPAKOTE ER  Take 1 tablet (500 mg total) by mouth daily.     doxycycline 100 MG tablet  Commonly known as:  VIBRA-TABS  Take 1 tablet (100 mg total) by mouth every 12 (twelve) hours.     guaiFENesin 600 MG 12 hr tablet  Commonly known as:  MUCINEX  Take 600 mg by mouth 2 (two) times daily. OTC     levETIRAcetam 500 MG tablet  Commonly known as:  KEPPRA  TAKE 1 TABLET(500 MG) BY MOUTH TWICE DAILY     loperamide 2 MG  capsule  Commonly known as:  IMODIUM  TAKE 1 TO 2 CAPSULES BY MOUTH 3 TIMES A DAY AS NEEDED FOR DIARRHEA     meclizine 25 MG tablet  Commonly known as:  ANTIVERT  TAKE 1 TABLET (25 MG TOTAL) BY MOUTH 3 (THREE) TIMES DAILY AS NEEDED.     multivitamin with minerals Tabs tablet  Take 1 tablet by mouth daily.     NEXIUM 40 MG capsule  Generic drug:  esomeprazole  TAKE ONE CAPSULE BY MOUTH EVERY MORNING WITH BREAKFAST     oxybutynin 5 MG tablet  Commonly known as:  DITROPAN  TAKE 1 TABLET (5 MG TOTAL) BY MOUTH 3 (THREE) TIMES DAILY.     Potassium Chloride ER 20 MEQ Tbcr  Take 1 tablet by mouth every other day.     predniSONE 50 MG tablet  Commonly known as:  DELTASONE  Take 1 tablet (50 mg total) by mouth daily with breakfast.     rOPINIRole 2 MG tablet  Commonly known as:  REQUIP  Take 0.5 tablets (1 mg total) by mouth at bedtime.        Discharge Instructions: Please refer to Patient Instructions section of EMR for full details.  Patient was counseled important signs and symptoms that should prompt return to medical care, changes in medications, dietary instructions, activity restrictions, and follow up appointments.   Follow-Up Appointments: Follow-up Information    Follow up with Vance Gather, MD. Call in 1 day.   Specialty:  Family Medicine   Why:  To schedule an Appointment   Contact information:   [place] [address] [phone]       Elberta Leatherwood, MD 12/22/2014, 2:03 PM PGY-2, [place]

## 2014-12-22 NOTE — Progress Notes (Signed)
Patient Discharge:  Disposition: Pt discharged home  Education: Pt educated on medications, follow up appointments and all discharge instructions. Pt verbalized understanding.   IV: Removed.   Telemetry: Removed. CCMD notified.   Follow-up appointments: Reviewed with pt.   Prescriptions: Scripts sent to pt's pharmacy  Transportation: Pt given cab voucher from Education officer, museum  Belongings: All belongings taken with pt.

## 2014-12-22 NOTE — ED Notes (Signed)
Pt states he had his cane with him when transported by ambulance. This RN looked all over room for cane and could not find it. This RN will ask Network engineer to call [facility] to locate pt. [employer]

## 2014-12-22 NOTE — ED Notes (Signed)
This RN received a call from [facility] about pt. Code sepsis status. This RN informed [facility] that the plan was to discharge pt. [facility] stated they would cancel Code Sepsis

## 2014-12-22 NOTE — Care Management (Signed)
CM met with patient at bedside, patient reports being transported to [facility] ED via EMS where his cane was lost. Discussed the DME agency that patient uses, patient states, [facility]. Referral called into [facility] Rep at [facility] on weekends cane delivered to room. Patient states, PCP is  Dr. Bonner Puna at [facility].  CM will notify office scheduler J. Werner [phone] to contact him to arrange a follow up appt tomorrow patient is agreeable with this plan. Patient is concern about transportation home upon discharge SW notified. No further CM needs.

## 2014-12-22 NOTE — Discharge Instructions (Signed)
Chronic Obstructive Pulmonary Disease Chronic obstructive pulmonary disease (COPD) is a common lung condition in which airflow from the lungs is limited. COPD is a general term that can be used to describe many different lung problems that limit airflow, including both chronic bronchitis and emphysema. If you have COPD, your lung function will probably never return to normal, but there are measures you can take to improve lung function and make yourself feel better. CAUSES   Smoking (common).  Exposure to secondhand smoke.  Genetic problems.  Chronic inflammatory lung diseases or recurrent infections. SYMPTOMS  Shortness of breath, especially with physical activity.  Deep, persistent (chronic) cough with a large amount of thick mucus.  Wheezing.  Rapid breaths (tachypnea).  Gray or bluish discoloration (cyanosis) of the skin, especially in your fingers, toes, or lips.  Fatigue.  Weight loss.  Frequent infections or episodes when breathing symptoms become much worse (exacerbations).  Chest tightness. DIAGNOSIS Your health care provider will take a medical history and perform a physical examination to diagnose COPD. Additional tests for COPD may include:  Lung (pulmonary) function tests.  Chest X-ray.  CT scan.  Blood tests. TREATMENT  Treatment for COPD may include:  Inhaler and nebulizer medicines. These help manage the symptoms of COPD and make your breathing more comfortable.  Supplemental oxygen. Supplemental oxygen is only helpful if you have a low oxygen level in your blood.  Exercise and physical activity. These are beneficial for nearly all people with COPD.  Lung surgery or transplant.  Nutrition therapy to gain weight, if you are underweight.  Pulmonary rehabilitation. This may involve working with a team of health care providers and specialists, such as respiratory, occupational, and physical therapists. HOME CARE INSTRUCTIONS  Take all medicines  (inhaled or pills) as directed by your health care provider.  Avoid over-the-counter medicines or cough syrups that dry up your airway (such as antihistamines) and slow down the elimination of secretions unless instructed otherwise by your health care provider.  If you are a smoker, the most important thing that you can do is stop smoking. Continuing to smoke will cause further lung damage and breathing trouble. Ask your health care provider for help with quitting smoking. He or she can direct you to community resources or hospitals that provide support.  Avoid exposure to irritants such as smoke, chemicals, and fumes that aggravate your breathing.  Use oxygen therapy and pulmonary rehabilitation if directed by your health care provider. If you require home oxygen therapy, ask your health care provider whether you should purchase a pulse oximeter to measure your oxygen level at home.  Avoid contact with individuals who have a contagious illness.  Avoid extreme temperature and humidity changes.  Eat healthy foods. Eating smaller, more frequent meals and resting before meals may help you maintain your strength.  Stay active, but balance activity with periods of rest. Exercise and physical activity will help you maintain your ability to do things you want to do.  Preventing infection and hospitalization is very important when you have COPD. Make sure to receive all the vaccines your health care provider recommends, especially the pneumococcal and influenza vaccines. Ask your health care provider whether you need a pneumonia vaccine.  Learn and use relaxation techniques to manage stress.  Learn and use controlled breathing techniques as directed by your health care provider. Controlled breathing techniques include:  Pursed lip breathing. Start by breathing in (inhaling) through your nose for 1 second. Then, purse your lips as if you were   going to whistle and breathe out (exhale) through the  pursed lips for 2 seconds.  Diaphragmatic breathing. Start by putting one hand on your abdomen just above your waist. Inhale slowly through your nose. The hand on your abdomen should move out. Then purse your lips and exhale slowly. You should be able to feel the hand on your abdomen moving in as you exhale.  Learn and use controlled coughing to clear mucus from your lungs. Controlled coughing is a series of short, progressive coughs. The steps of controlled coughing are: 1. Lean your head slightly forward. 2. Breathe in deeply using diaphragmatic breathing. 3. Try to hold your breath for 3 seconds. 4. Keep your mouth slightly open while coughing twice. 5. Spit any mucus out into a tissue. 6. Rest and repeat the steps once or twice as needed. SEEK MEDICAL CARE IF:  You are coughing up more mucus than usual.  There is a change in the color or thickness of your mucus.  Your breathing is more labored than usual.  Your breathing is faster than usual. SEEK IMMEDIATE MEDICAL CARE IF:  You have shortness of breath while you are resting.  You have shortness of breath that prevents you from:  Being able to talk.  Performing your usual physical activities.  You have chest pain lasting longer than 5 minutes.  Your skin color is more cyanotic than usual.  You measure low oxygen saturations for longer than 5 minutes with a pulse oximeter. MAKE SURE YOU:  Understand these instructions.  Will watch your condition.  Will get help right away if you are not doing well or get worse.   This information is not intended to replace advice given to you by your health care provider. Make sure you discuss any questions you have with your health care provider.   Document Released: 11/11/2004 Document Revised: 02/22/2014 Document Reviewed: 09/28/2012 Elsevier Interactive Patient Education 2016 [employer]vier Inc.  

## 2014-12-22 NOTE — ED Notes (Signed)
Pt. Sleeping and SPO2 dropped to 84% with good pleth. RN applied 2L [place] and SPO2 went back up to 97%
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
## 2014-12-22 NOTE — ED Notes (Signed)
Pt. Ambulated with pulse ox on RA at 93-95%. HR went up to 151

## 2014-12-22 NOTE — Progress Notes (Signed)
Patient admitted from ED. Alert oriented x4. Placed on telemetry. Needs attended and safety maintained. MD on call paged regarding patient's arrival.

## 2014-12-23 LAB — URINE CULTURE

## 2014-12-23 LAB — HEMOGLOBIN A1C
HEMOGLOBIN A1C: 5.8 % — AB (ref 4.8–5.6)
Mean Plasma Glucose: 120 mg/dL

## 2014-12-26 LAB — CULTURE, BLOOD (ROUTINE X 2)
CULTURE: NO GROWTH
Culture: NO GROWTH

## 2014-12-30 ENCOUNTER — Emergency Department (HOSPITAL_COMMUNITY): Payer: Commercial Managed Care - HMO

## 2014-12-30 ENCOUNTER — Emergency Department (HOSPITAL_COMMUNITY)
Admission: EM | Admit: 2014-12-30 | Discharge: 2014-12-30 | Disposition: A | Discharge status: Home / Self Care | Payer: Commercial Managed Care - HMO | Attending: Emergency Medicine | Admitting: Emergency Medicine

## 2014-12-30 ENCOUNTER — Encounter (HOSPITAL_COMMUNITY): Payer: Self-pay | Admitting: Emergency Medicine

## 2014-12-30 DIAGNOSIS — J449 Chronic obstructive pulmonary disease, unspecified: Secondary | ICD-10-CM | POA: Insufficient documentation

## 2014-12-30 DIAGNOSIS — Z86010 Personal history of colonic polyps: Secondary | ICD-10-CM | POA: Diagnosis not present

## 2014-12-30 DIAGNOSIS — M069 Rheumatoid arthritis, unspecified: Secondary | ICD-10-CM | POA: Insufficient documentation

## 2014-12-30 DIAGNOSIS — Z87891 Personal history of nicotine dependence: Secondary | ICD-10-CM | POA: Diagnosis not present

## 2014-12-30 DIAGNOSIS — E119 Type 2 diabetes mellitus without complications: Secondary | ICD-10-CM | POA: Insufficient documentation

## 2014-12-30 DIAGNOSIS — I10 Essential (primary) hypertension: Secondary | ICD-10-CM | POA: Insufficient documentation

## 2014-12-30 DIAGNOSIS — Z87442 Personal history of urinary calculi: Secondary | ICD-10-CM | POA: Insufficient documentation

## 2014-12-30 DIAGNOSIS — Z9889 Other specified postprocedural states: Secondary | ICD-10-CM | POA: Diagnosis not present

## 2014-12-30 DIAGNOSIS — G40909 Epilepsy, unspecified, not intractable, without status epilepticus: Secondary | ICD-10-CM | POA: Diagnosis not present

## 2014-12-30 DIAGNOSIS — E669 Obesity, unspecified: Secondary | ICD-10-CM | POA: Insufficient documentation

## 2014-12-30 DIAGNOSIS — D869 Sarcoidosis, unspecified: Secondary | ICD-10-CM | POA: Diagnosis not present

## 2014-12-30 DIAGNOSIS — Z862 Personal history of diseases of the blood and blood-forming organs and certain disorders involving the immune mechanism: Secondary | ICD-10-CM | POA: Insufficient documentation

## 2014-12-30 DIAGNOSIS — Z79899 Other long term (current) drug therapy: Secondary | ICD-10-CM | POA: Diagnosis not present

## 2014-12-30 DIAGNOSIS — Z7982 Long term (current) use of aspirin: Secondary | ICD-10-CM | POA: Insufficient documentation

## 2014-12-30 DIAGNOSIS — E785 Hyperlipidemia, unspecified: Secondary | ICD-10-CM | POA: Insufficient documentation

## 2014-12-30 DIAGNOSIS — S301XXA Contusion of abdominal wall, initial encounter: Secondary | ICD-10-CM

## 2014-12-30 DIAGNOSIS — F319 Bipolar disorder, unspecified: Secondary | ICD-10-CM | POA: Diagnosis not present

## 2014-12-30 DIAGNOSIS — I252 Old myocardial infarction: Secondary | ICD-10-CM | POA: Diagnosis not present

## 2014-12-30 DIAGNOSIS — Z859 Personal history of malignant neoplasm, unspecified: Secondary | ICD-10-CM | POA: Insufficient documentation

## 2014-12-30 DIAGNOSIS — Z8719 Personal history of other diseases of the digestive system: Secondary | ICD-10-CM | POA: Insufficient documentation

## 2014-12-30 DIAGNOSIS — M797 Fibromyalgia: Secondary | ICD-10-CM | POA: Diagnosis not present

## 2014-12-30 DIAGNOSIS — Z880 Allergy status to penicillin: Secondary | ICD-10-CM | POA: Diagnosis not present

## 2014-12-30 DIAGNOSIS — M7981 Nontraumatic hematoma of soft tissue: Secondary | ICD-10-CM | POA: Insufficient documentation

## 2014-12-30 LAB — HEPATIC FUNCTION PANEL
ALBUMIN: 3.4 g/dL — AB (ref 3.5–5.0)
ALT: 16 U/L — AB (ref 17–63)
AST: 18 U/L (ref 15–41)
Alkaline Phosphatase: 62 U/L (ref 38–126)
BILIRUBIN INDIRECT: 0.5 mg/dL (ref 0.3–0.9)
Bilirubin, Direct: 0.1 mg/dL (ref 0.1–0.5)
TOTAL PROTEIN: 6.4 g/dL — AB (ref 6.5–8.1)
Total Bilirubin: 0.6 mg/dL (ref 0.3–1.2)

## 2014-12-30 LAB — CBC WITH DIFFERENTIAL/PLATELET
BASOS ABS: 0 10*3/uL (ref 0.0–0.1)
Basophils Relative: 0 %
EOS PCT: 2 %
Eosinophils Absolute: 0.2 10*3/uL (ref 0.0–0.7)
HEMATOCRIT: 42.2 % (ref 39.0–52.0)
HEMOGLOBIN: 13.8 g/dL (ref 13.0–17.0)
LYMPHS ABS: 2.1 10*3/uL (ref 0.7–4.0)
LYMPHS PCT: 22 %
MCH: 30.2 pg (ref 26.0–34.0)
MCHC: 32.7 g/dL (ref 30.0–36.0)
MCV: 92.3 fL (ref 78.0–100.0)
Monocytes Absolute: 0.8 10*3/uL (ref 0.1–1.0)
Monocytes Relative: 8 %
NEUTROS ABS: 6.5 10*3/uL (ref 1.7–7.7)
NEUTROS PCT: 68 %
PLATELETS: 305 10*3/uL (ref 150–400)
RBC: 4.57 MIL/uL (ref 4.22–5.81)
RDW: 14.9 % (ref 11.5–15.5)
WBC: 9.6 10*3/uL (ref 4.0–10.5)

## 2014-12-30 LAB — URINALYSIS, ROUTINE W REFLEX MICROSCOPIC
Glucose, UA: NEGATIVE mg/dL
Hgb urine dipstick: NEGATIVE
KETONES UR: NEGATIVE mg/dL
LEUKOCYTES UA: NEGATIVE
NITRITE: NEGATIVE
PROTEIN: NEGATIVE mg/dL
Specific Gravity, Urine: 1.026 (ref 1.005–1.030)
Urobilinogen, UA: 1 mg/dL (ref 0.0–1.0)
pH: 6 (ref 5.0–8.0)

## 2014-12-30 LAB — BASIC METABOLIC PANEL
ANION GAP: 9 (ref 5–15)
BUN: 13 mg/dL (ref 6–20)
CHLORIDE: 105 mmol/L (ref 101–111)
CO2: 27 mmol/L (ref 22–32)
CREATININE: 1.11 mg/dL (ref 0.61–1.24)
Calcium: 8.8 mg/dL — ABNORMAL LOW (ref 8.9–10.3)
GFR calc non Af Amer: >60 mL/min (ref >60)
Glucose, Bld: 101 mg/dL — ABNORMAL HIGH (ref 65–99)
Potassium: 3.7 mmol/L (ref 3.5–5.1)
SODIUM: 141 mmol/L (ref 135–145)

## 2014-12-30 LAB — CK: Total CK: 97 U/L (ref 49–397)

## 2014-12-30 LAB — LIPASE, BLOOD: LIPASE: 26 U/L (ref 11–51)

## 2014-12-30 LAB — PROTIME-INR
INR: 0.95 (ref 0.00–1.49)
Prothrombin Time: 12.9 seconds (ref 11.6–15.2)

## 2014-12-30 MED ORDER — BENZONATATE 100 MG PO CAPS
100.0000 mg | ORAL_CAPSULE | Freq: Once | ORAL | Status: AC
  Administered 2014-12-30: 100 mg via ORAL
  Filled 2014-12-30: qty 1

## 2014-12-30 MED ORDER — IOHEXOL 300 MG/ML  SOLN
100.0000 mL | Freq: Once | INTRAMUSCULAR | Status: AC | PRN
  Administered 2014-12-30: 100 mL via INTRAVENOUS

## 2014-12-30 MED ORDER — ACETAMINOPHEN 325 MG PO TABS
650.0000 mg | ORAL_TABLET | Freq: Once | ORAL | Status: DC
Start: 2014-12-30 — End: 2014-12-31
  Filled 2014-12-30: qty 2

## 2014-12-30 MED ORDER — BENZONATATE 100 MG PO CAPS: 100.0000 mg | ORAL_CAPSULE | Freq: Three times a day (TID) | ORAL | Status: DC

## 2014-12-30 NOTE — ED Notes (Signed)
Patient unhappy at discharge as he was not going to be provided with a taxi voucher "like they do at [facility]". Patient also unsatisfied with not faxing in his Rx to the pharmacy. Patient was advised he would have to take the Rx to the rug store or have it filled by his facility. Patient moved to the hallway to await PTAR, he was agreeable to that. Patient coughing when not being talked to, cough stops when patient is engaged in conversation. Patient given water while waiting.

## 2014-12-30 NOTE — ED Notes (Signed)
Patient refused tylenol, stating "it is not strong enough - they give me morphine and oxycodone so I don't see the point of taking it".

## 2014-12-30 NOTE — ED Provider Notes (Signed)
CSN: [id number]     Arrival date & time 12/30/14  1218 History   None    Chief Complaint  Patient presents with  . Abdominal Bruising      (Consider location/radiation/quality/duration/timing/severity/associated sxs/prior Treatment) HPI   Patrick Holt is a 64 y.o. male, with a history of COPD, DM, and , presenting to the ED with abdominal bruising since yesterday, one of which came on while pt was here in the ED today.  Bruising accompanied by soreness over the bruised areas. Pt denies this ever happening before. Pt rates pain at 4/10, non-radiating, and is only present on palpation. Pt denies anticoagulation other than ASA. Pt also complains of productive cough and shortness of breath since Nov 5. Pt was treated with ABX and prednisone. Admitted to [facility] on Nov 5 for 24 hours. Pt had a chemical stress test about a year ago, some blockages were found, but no stents were necessary at that time.  Denies falling/trauma, N/V/C/D, recent illness (other than that mentioned above), or any other complaints.    Past Medical History  Diagnosis Date  . Rheumatoid arthritis(714.0)   . Obesity   . Major depression ([place])     with acute psychotic break in 06/2010  . Hypertension   . Hyperlipidemia   . Diverticulosis of colon   . COPD (chronic obstructive pulmonary disease) ([place])   . Anxiety   . GERD (gastroesophageal reflux disease)   . Vertigo   . Fibromyalgia   . Dermatomyositis ([place])   . Myocardial infarct ([place])     mulitple (1999, 2000, 2004)  . Raynaud's disease   . Narcotic dependence ([place])   . Peripheral neuropathy ([place])   . Internal hemorrhoids   . Ischemic heart disease   . Hiatal hernia   . Gastritis   . Diverticulitis   . Hx of adenomatous colonic polyps   . Nephrolithiasis   . Anemia   . Esophageal stricture   . Esophageal dysmotility   . Dermatomyositis ([place])   . Urge incontinence   . Otosclerosis   . Bipolar 1 disorder ([place])   . OCD (obsessive compulsive disorder)    . Sarcoidosis ([place])   . Paroxysmal a-fib ([place])   . Dysrhythmia     "irregular" (11/15/2012)  . Type II diabetes mellitus ([place])   . Seizures ([place])   . Headache(784.0)     "severe; get them often" (11/15/2012)  . Subarachnoid hemorrhage ([place]) 01/2012    with subdural  hematoma.   . Atrial fibrillation ([place]) 01/2012    with RVR  . Conversion disorder 06/2010  . History of narcotic addiction ([place])   . Asthma   . Cancer ([place])   . Collagen vascular disease ([place])   . Renal insufficiency    Past Surgical History  Procedure Laterality Date  . Knee arthroscopy w/ meniscal repair Left 2009  . Lumbar disc surgery    . Squamous papilloma   2010    removed by Dr. Constance Holster ENT, noted on tongue  . Esophagogastroduodenoscopy N/A 09/27/2012    Procedure: ESOPHAGOGASTRODUODENOSCOPY (EGD);  Surgeon: Lafayette Dragon, MD;  Location: [facility];  Service: Endoscopy;  Laterality: N/A;  . Colonoscopy N/A 09/27/2012    Procedure: COLONOSCOPY;  Surgeon: Lafayette Dragon, MD;  Location: [facility];  Service: Endoscopy;  Laterality: N/A;  . Cataract extraction w/ intraocular lens implant Left   . Lymph node dissection Right 1970's    "neck; dr thought I had Hodgkins; turned out to be sarcoidosis" (11/15/2012)  .  Tonsillectomy    . Carpal tunnel release Bilateral   . Cardiac catheterization    . Back surgery     Family History  Problem Relation Age of Onset  . Alcohol abuse Mother   . Depression Mother   . Heart disease Mother   . Diabetes Mother   . Stroke Mother   . Diabetes Other     1/2 brother  . Hepatitis Brother   . Coronary artery disease Father    Social History  Substance Use Topics  . Smoking status: Former Smoker -- 0.50 packs/day for 30 years    Types: Cigarettes    Quit date: 12/15/2014  . Smokeless tobacco: Never Used     Comment: 3 cigs a day  . Alcohol Use: No     Comment: Alcohol stopped in September of 2014    Review of Systems  Gastrointestinal:       Abdominal bruising   All other systems reviewed and are negative.     Allergies  Immune globulins; Ciprofloxacin; Flagyl; Lisinopril; Sulfa antibiotics; Betamethasone dipropionate; Bupropion hcl; Clobetasol; Codeine; Escitalopram oxalate; Fluoxetine hcl; Fluticasone-salmeterol; Furosemide; Paroxetine; Penicillins; Tacrolimus; Tetanus toxoid; and Tuberculin purified protein derivative  Home Medications   Prior to Admission medications   Medication Sig Start Date End Date Taking? Authorizing Provider  albuterol-ipratropium (COMBIVENT) 18-103 MCG/ACT inhaler Inhale 1 puff into the lungs every 6 (six) hours as needed for wheezing or shortness of breath. 12/03/14  Yes Patrecia Pour, MD  amiodarone (PACERONE) 200 MG tablet Take 0.5 tablets (100 mg total) by mouth daily. 11/20/14  Yes Fay Records, MD  aspirin EC 81 MG tablet Take 81 mg by mouth daily.   Yes Historical Provider, MD  atorvastatin (LIPITOR) 40 MG tablet Take 1 tablet (40 mg total) by mouth daily. 11/11/14  Yes Fay Records, MD  divalproex (DEPAKOTE ER) 500 MG 24 hr tablet Take 1 tablet (500 mg total) by mouth daily. 07/30/14  Yes Renee A Kuneff, DO  levETIRAcetam (KEPPRA) 500 MG tablet TAKE 1 TABLET(500 MG) BY MOUTH TWICE DAILY 07/31/14  Yes Renee A Kuneff, DO  loperamide (IMODIUM) 2 MG capsule TAKE 1 TO 2 CAPSULES BY MOUTH 3 TIMES A DAY AS NEEDED FOR DIARRHEA 11/15/14  Yes Patrecia Pour, MD  Multiple Vitamin (MULTIVITAMIN WITH MINERALS) TABS tablet Take 1 tablet by mouth daily.   Yes Historical Provider, MD  NEXIUM 40 MG capsule TAKE ONE CAPSULE BY MOUTH EVERY MORNING WITH BREAKFAST 03/07/14  Yes Renee A Kuneff, DO  oxybutynin (DITROPAN) 5 MG tablet TAKE 1 TABLET (5 MG TOTAL) BY MOUTH 3 (THREE) TIMES DAILY. 12/17/14  Yes Patrecia Pour, MD  Potassium Chloride ER 20 MEQ TBCR Take 1 tablet by mouth every other day.  11/29/14  Yes Historical Provider, MD  rOPINIRole (REQUIP) 2 MG tablet Take 0.5 tablets (1 mg total) by mouth at bedtime. 12/03/14  Yes Patrecia Pour, MD   benzonatate (TESSALON) 100 MG capsule Take 1 capsule (100 mg total) by mouth every 8 (eight) hours. 12/30/14   Savannah Morford C Keylor Rands, PA-C  doxycycline (VIBRA-TABS) 100 MG tablet Take 1 tablet (100 mg total) by mouth every 12 (twelve) hours. Patient not taking: Reported on 12/30/2014 12/22/14   Elberta Leatherwood, MD  guaiFENesin (MUCINEX) 600 MG 12 hr tablet Take 600 mg by mouth 2 (two) times daily. OTC    Historical Provider, MD  meclizine (ANTIVERT) 25 MG tablet TAKE 1 TABLET (25 MG TOTAL) BY MOUTH 3 (THREE) TIMES DAILY  AS NEEDED. 11/15/14   Historical Provider, MD  predniSONE (DELTASONE) 50 MG tablet Take 1 tablet (50 mg total) by mouth daily with breakfast. Patient not taking: Reported on 12/30/2014 12/22/14   Elberta Leatherwood, MD   BP 134/88 mmHg  Pulse 88  Temp(Src) 99.3 F (37.4 C) (Oral)  Resp 16  Ht 5\' 5"  (1.651 m)  Wt 207 lb (93.895 kg)  BMI 34.45 kg/m2  SpO2 97% Physical Exam  Constitutional: He appears well-developed and well-nourished. No distress.  HENT:  Head: Normocephalic and atraumatic.  Eyes: Conjunctivae are normal. Pupils are equal, round, and reactive to light.  Cardiovascular: Normal rate, regular rhythm and normal heart sounds.   Pulmonary/Chest: Effort normal and breath sounds normal. No respiratory distress.  Abdominal: Soft. Bowel sounds are normal.  3 areas of blue-purple bruising on left abdomen and left flank, the largest of which is on his flank and is about the size of the patient's fist. May be some firmness over the bruises, but difficult to assess due to pt frequent coughing.   Musculoskeletal: He exhibits no edema or tenderness.  Neurological: He is alert.  Skin: Skin is warm and dry. He is not diaphoretic.  Nursing note and vitals reviewed.   ED Course  Procedures (including critical care time) Labs Review Labs Reviewed  BASIC METABOLIC PANEL - Abnormal; Notable for the following:    Glucose, Bld 101 (*)    Calcium 8.8 (*)    All other components within normal  limits  HEPATIC FUNCTION PANEL - Abnormal; Notable for the following:    Total Protein 6.4 (*)    Albumin 3.4 (*)    ALT 16 (*)    All other components within normal limits  URINALYSIS, ROUTINE W REFLEX MICROSCOPIC (NOT AT [facility]) - Abnormal; Notable for the following:    Color, Urine AMBER (*)    Bilirubin Urine SMALL (*)    All other components within normal limits  CBC WITH DIFFERENTIAL/PLATELET  PROTIME-INR  LIPASE, BLOOD  CK    Imaging Review Dg Chest 2 View  12/30/2014  CLINICAL DATA:  Cough since October per pt. Per EMS patient has bruising on abdomen that he does not know where it came from. The brusing has showed up over the past 48 hours. Bruising on abdomen. EXAM: CHEST  2 VIEW COMPARISON:  10/24/2014 FINDINGS: Lungs are mildly hyperinflated. There is elevation of the right hemidiaphragm, stable since prior studies. There is mild perihilar peribronchial thickening. There are no focal consolidations or pleural effusions. Mid thoracic spondylosis. IMPRESSION: 1. Hyperinflation. 2. Bronchitic changes. Electronically Signed   By: Nolon Nations M.D.   On: 12/30/2014 17:53   Ct Abdomen Pelvis W Contrast  12/30/2014  CLINICAL DATA:  Left-sided abdominal pain and bruising. EXAM: CT ABDOMEN AND PELVIS WITH CONTRAST TECHNIQUE: Multidetector CT imaging of the abdomen and pelvis was performed using the standard protocol following bolus administration of intravenous contrast. CONTRAST:  115mL OMNIPAQUE IOHEXOL 300 MG/ML  SOLN COMPARISON:  10/09/2013 FINDINGS: There is a left rectus sheath hematoma. This accounts for the patient's abdominal pain and bruising. The lung bases are clear. The heart is normal in size. Stable aortic and coronary artery calcifications. The solid abdominal organs are normal and stable. No acute intra-abdominal/ intrapelvic abnormalities are demonstrated. No retroperitoneal hematoma. Moderate to advanced atherosclerotic calcifications involving the aorta and iliac  arteries but no focal aneurysm or dissection. Moderate diverticulosis of the sigmoid colon but no findings for acute diverticulitis. No mesenteric or retroperitoneal mass  or adenopathy.  No hematoma. The deep bladder, prostate gland and seminal vesicles are unremarkable. A urachal remnant is noted. No inguinal mass or adenopathy. IMPRESSION: 1. Left rectus sheath hematoma accounting for the patient's tender left-sided abdominal pain and bruising. No retroperitoneal hematoma. 2. No significant or acute intra-abdominal/intrapelvic findings. 3. Moderate to advanced atherosclerotic calcifications involving the aorta and iliac arteries. Electronically Signed   By: Marijo Sanes M.D.   On: 12/30/2014 20:58   I have personally reviewed and evaluated these images and lab results as part of my medical decision-making.   EKG Interpretation None      MDM   Final diagnoses:  Superficial bruising of abdominal wall, initial encounter  Rectus sheath hematoma, initial encounter    Patrick Holt presents with abdominal bruising since yesterday.  Findings and plan of care discussed with Wandra Arthurs, MD.  Plan to obtain chest x-ray. CBC, BMP, hepatic function panel, PT/INR, and lipase without acute abnormalities and reveals nothing that would explain patient's symptoms. Plan to obtain a CT scan of patient's abdomen. 9:10 PM abdominal CT reveals left rectus sheath hematoma which would account for patient's tenderness and bruising in that area. No other significant intra-abdominal or intrapelvic findings. CT findings were communicated with patient as well as the plan of care upon discharge. Patient voiced understanding and acceptance of this plan and is comfortable with discharge.  Lorayne Bender, PA-C 12/30/14 2123  Wandra Arthurs, MD 12/31/14 [phone]

## 2014-12-30 NOTE — ED Notes (Addendum)
Per EMS patient has bruising on abdomen that he does not know where it came from. The brusing has showed up over the past 48 hours. Bruising on abdomen is painful when touched and when the patient coughs. Denies trauma/injury. Recently hospitalized at [facility] for COPD / A. Fib. and received one Lovenox injection during admission. Not currently on any blood thinners.

## 2014-12-30 NOTE — ED Notes (Signed)
Patient very anxious and upset stating no one has seen him. Three different clinicians has seen the patient. The delay has been explained to the patient by the assigned nurse and the lead tech. The attending provider is at the bedside at this time.

## 2014-12-30 NOTE — Discharge Instructions (Signed)
You have been seen today for abdominal bruising. Her CT scan showed a hematoma, or a collection of blood, and one of your abdominal muscles. Treatment for this is symptomatic including applying ice to the area. The area will become more bruised over the next few days. Follow up with PCP as needed. Return to ED should symptoms worsen.

## 2014-12-30 NOTE — ED Notes (Signed)
Patient coughing but is easily distracted by conversation and coughing ceases.  Patient begins coughing again once he stops talking.

## 2015-01-01 ENCOUNTER — Ambulatory Visit (INDEPENDENT_AMBULATORY_CARE_PROVIDER_SITE_OTHER): Payer: Commercial Managed Care - HMO | Admitting: Family Medicine

## 2015-01-01 ENCOUNTER — Encounter: Payer: Self-pay | Admitting: Family Medicine

## 2015-01-01 VITALS — Temp 98.8°F | Ht 64.0 in | Wt 219.2 lb

## 2015-01-01 DIAGNOSIS — F411 Generalized anxiety disorder: Secondary | ICD-10-CM

## 2015-01-01 DIAGNOSIS — N3941 Urge incontinence: Secondary | ICD-10-CM

## 2015-01-01 MED ORDER — ALPRAZOLAM 1 MG PO TABS: 1.0000 mg | ORAL_TABLET | Freq: Two times a day (BID) | ORAL | Status: DC | PRN

## 2015-01-01 NOTE — Patient Instructions (Signed)
Thank you for coming in today!  - You will be contacted to schedule an appointment with the urologists to discuss your urinary urgency - You should take xanax as needed only when you REALLY need it.   Our clinic's number is [phone]. Feel free to call any time with questions or concerns. We will answer any questions after hours with our 24-hour emergency line at that number as well.   - Dr. Bonner Puna

## 2015-01-01 NOTE — Assessment & Plan Note (Addendum)
Has significant ongoing issues with this and requests xanax today. He has significant allergies to medications, many of which are anti-depressants and I believe a therapeutic trial of this may beneficial. We will try this and follow up in 4 weeks. No longer seeing psychology.

## 2015-01-02 ENCOUNTER — Encounter: Payer: Self-pay | Admitting: Family Medicine

## 2015-01-02 NOTE — Progress Notes (Signed)
Subjective: Patrick Holt is a 64 y.o. male presenting for ongoing urinary urgency/frequency and anxiety.  He reports urinary urgency with urinary incontinence which is improved by taking oxybutynin 5mg  TID. He supplies a paper with an average of about 30 urinations per day including "a lot" at night which is disrupting sleep significantly. He reports significant problems not emptying bladder, voiding soon after each previous void, weak stream. His quality of life continues to worsen due to this condition. Denies stress incontinence, dysuria, changes in bowel habits. Note that patient is a poor historian  He also has had worsening of his chronic anxiety related to interactions, upon which he perseverates, with other residents and staff at his assisted living facility. He does not believe he is in danger, but feels that he is not wanted and has anxiety about being around people leading him to isolate himself. Alprazolam has helped with this in the past.   - Former smoker  Objective: BP   Temp(Src) 98.8 F (37.1 C) (Oral)  Ht 5\' 4"  (1.626 m)  Wt 219 lb 3.2 oz (99.428 kg)  BMI 37.61 kg/m2 Gen: Anxious but nontoxic-appearing 64 y.o. male in no distress DRE: Normal rectal tone, prostate is smooth and normal size to palpation.    12/30/2014 18:30  Appearance CLEAR  Bilirubin Urine SMALL (A)  Color, Urine AMBER (A)  Glucose NEGATIVE  Hgb urine dipstick NEGATIVE  Ketones, ur NEGATIVE  Leukocytes, UA NEGATIVE  Nitrite NEGATIVE  pH 6.0  Protein NEGATIVE  Specific Gravity, Urine 1.026  Urobilinogen, UA 1.0   CT ABDOMEN AND PELVIS WITH CONTRAST (12/30/2014)  FINDINGS: There is a left rectus sheath hematoma. This accounts for the patient's abdominal pain and bruising.  The lung bases are clear. The heart is normal in size. Stable aortic and coronary artery calcifications.  The solid abdominal organs are normal and stable. No acute intra-abdominal/ intrapelvic abnormalities are  demonstrated. No retroperitoneal hematoma.  Moderate to advanced atherosclerotic calcifications involving the aorta and iliac arteries but no focal aneurysm or dissection.  Moderate diverticulosis of the sigmoid colon but no findings for acute diverticulitis.  No mesenteric or retroperitoneal mass or adenopathy. No hematoma.  The deep bladder, prostate gland and seminal vesicles are unremarkable. A urachal remnant is noted. No inguinal mass or adenopathy.  IMPRESSION: 1. Left rectus sheath hematoma accounting for the patient's tender left-sided abdominal pain and bruising. No retroperitoneal hematoma. 2. No significant or acute intra-abdominal/intrapelvic findings. 3. Moderate to advanced atherosclerotic calcifications involving the aorta and iliac arteries.  Assessment/Plan: Patrick Holt is a 64 y.o. male here for urge incontinence and anxiety.   Anxiety state Has significant ongoing issues with this and requests xanax today. He has significant allergies to medications, many of which are anti-depressants and I believe a therapeutic trial of this may beneficial. We will try this and follow up in 4 weeks. No longer seeing psychology.   Urge incontinence Reports improved but still significant symptoms on oxybutynin severely impacting quality of life. Myrbetriq has been tried without success in the past. Prostate normal to palpation, normal on recent imaging, and urinalysis unremarkable. Will refer to urology for further considerations.

## 2015-01-02 NOTE — Assessment & Plan Note (Signed)
Reports improved but still significant symptoms on oxybutynin severely impacting quality of life. Myrbetriq has been tried without success in the past. Prostate normal to palpation, normal on recent imaging, and urinalysis unremarkable. Will refer to urology for further considerations.

## 2015-01-03 ENCOUNTER — Telehealth: Payer: Self-pay | Admitting: Family Medicine

## 2015-01-03 NOTE — Telephone Encounter (Signed)
LVM for pt to call back to inform him of his referral  Appt: 12/30 @ 1:30 PM with Dr. Matilde Sprang at [facility]. Letter mailed to patient with appt info. If pt calls back please inform him of below. Katharina Caper, Henning Ehle D, [place]

## 2015-01-03 NOTE — Telephone Encounter (Signed)
Checking status of referral to a urologist

## 2015-01-03 NOTE — Telephone Encounter (Signed)
Pt informed of below. Zimmerman Rumple, Taleisha Kaczynski D, CMA  

## 2015-01-17 ENCOUNTER — Other Ambulatory Visit: Payer: Self-pay | Admitting: *Deleted

## 2015-01-18 ENCOUNTER — Other Ambulatory Visit: Payer: Self-pay | Admitting: Family Medicine

## 2015-01-19 IMAGING — CR DG ABDOMEN ACUTE W/ 1V CHEST
4 series · 4 of 4 positions shown · non-contrast
Comparison: Chest x-ray on 03/01/2012

CLINICAL DATA: Abdominal pain and nausea.

ACUTE ABDOMEN SERIES (ABDOMEN 2 VIEW & CHEST 1 VIEW)

[w chest pa]
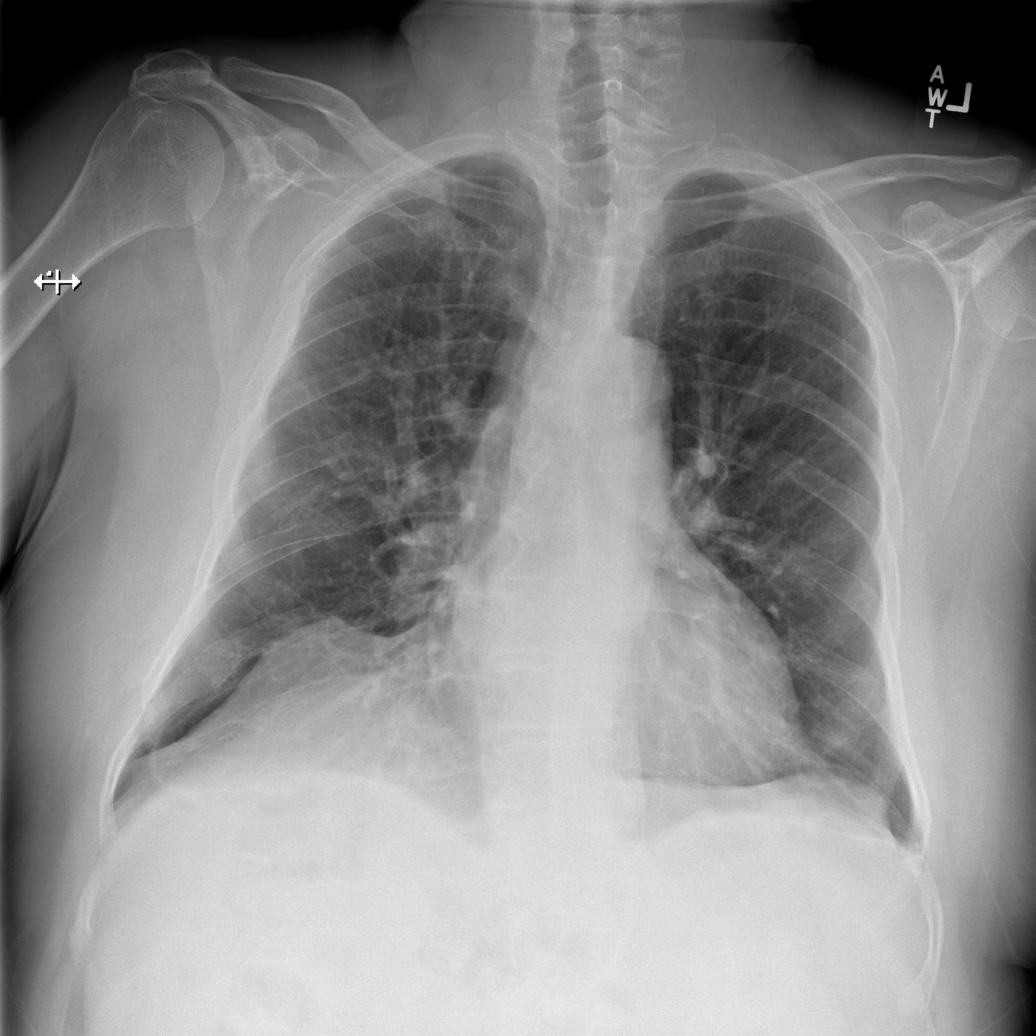

[w abdomen upright]
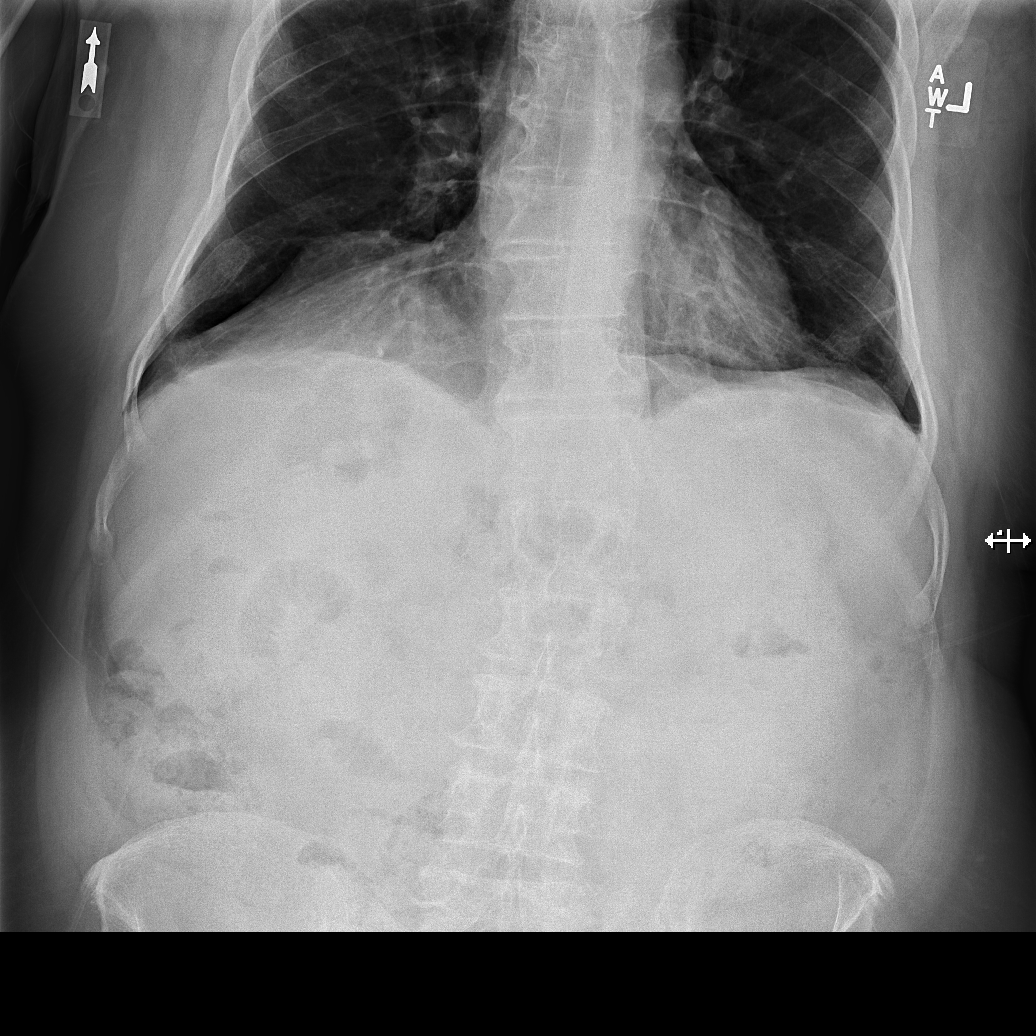

[t abdomen supine (1 of 2)]
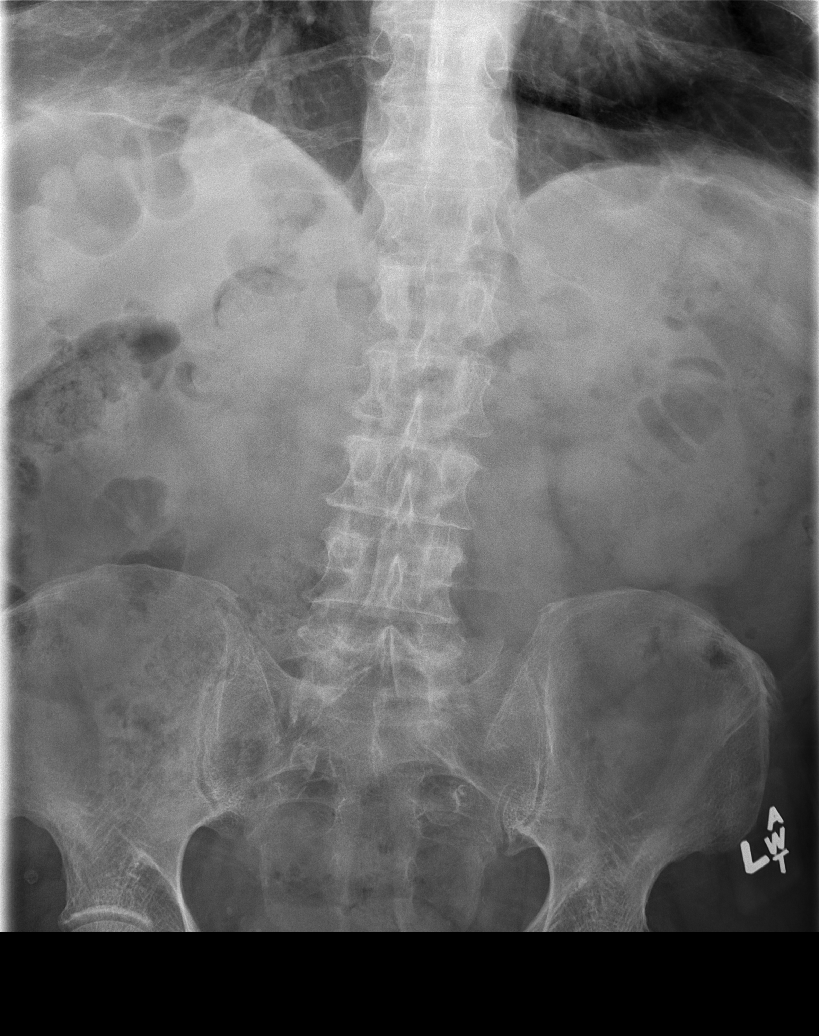

[t abdomen supine (2 of 2)]
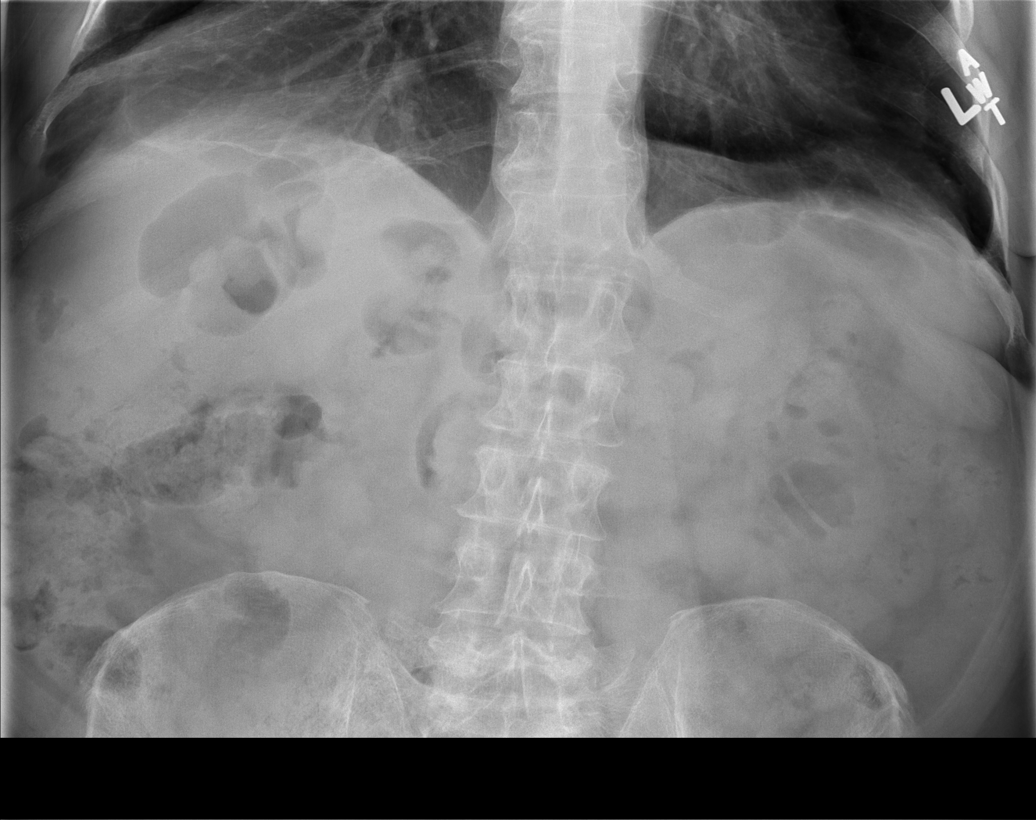

[4 of 4 positions shown; findings below may reference images not displayed]

FINDINGS: The lungs are clear.  There is elevation of the right
hemidiaphragm with associated atelectasis at the right lung base.
Mild atelectasis also present on the left.  No edema identified.
Heart size is normal.

Abdominal films show no evidence of bowel obstruction or ileus.  No
free air is identified.  Degenerative changes are present in the
lower lumbar spine.  No abnormal calcifications are seen.
IMPRESSION: No active disease.  Atelectasis at both lung bases.

## 2015-01-19 IMAGING — CT CT CERVICAL SPINE W/O CM
4 of 6 series · 14 of 33 positions shown, 16 images · non-contrast
Comparison: CT of the head on 02/29/2012

CT HEAD

CLINICAL DATA: Fall with head injury.

CT HEAD WITHOUT CONTRAST
CT CERVICAL SPINE WITHOUT CONTRAST
TECHNIQUE: Multidetector CT imaging of the head and cervical spine
was performed following the standard protocol without intravenous
contrast.  Multiplanar CT image reconstructions of the cervical
spine were also generated.

[Series 5: c-spine st · axial · 0.23mm/px · z∈[-256,-164]mm · 3 of 92 slices shown, 4 images]
[im 23/92  soft-tissue]
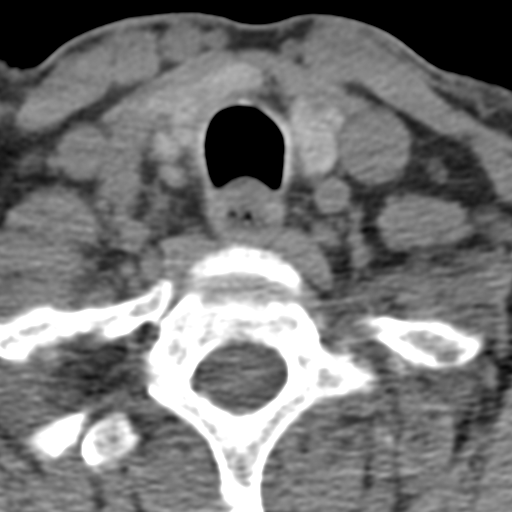
[im 23/92  bone]
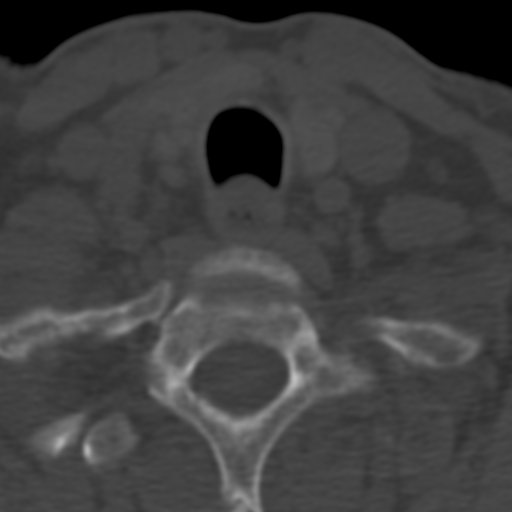
[im 46/92  bone]
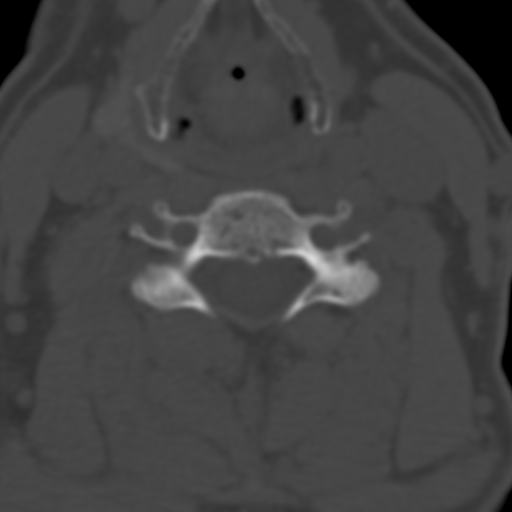
[im 69/92  bone]
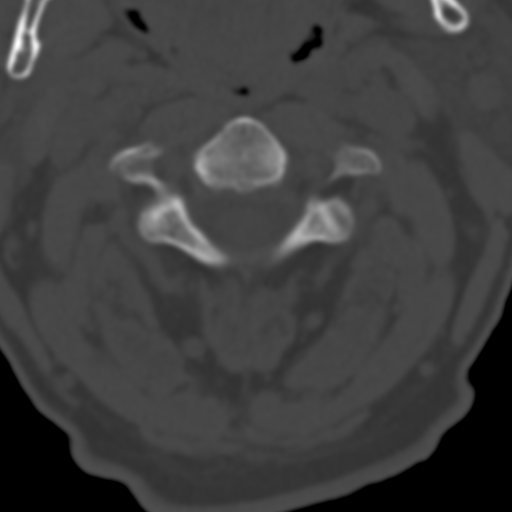

[Series 602: <mpr thick range> · axial · 0.36mm/px · z∈[-289,-194]mm · 3 of 100 slices shown]
[im 25/100  bone]
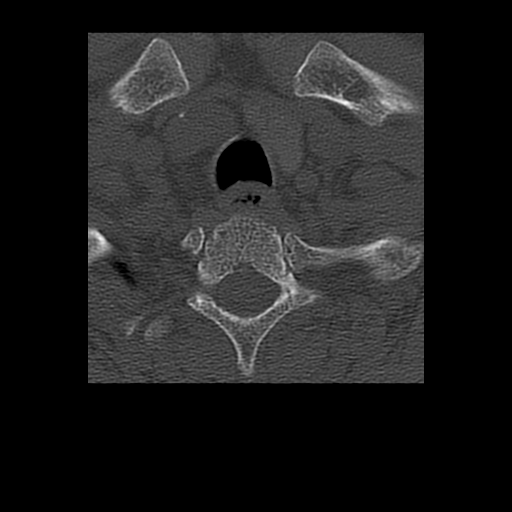
[im 50/100  bone]
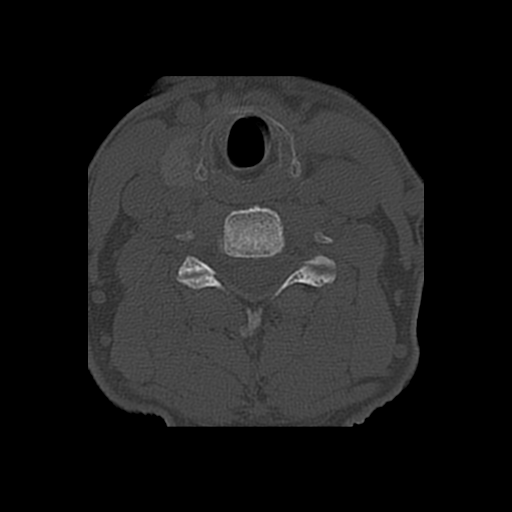
[im 75/100  bone]
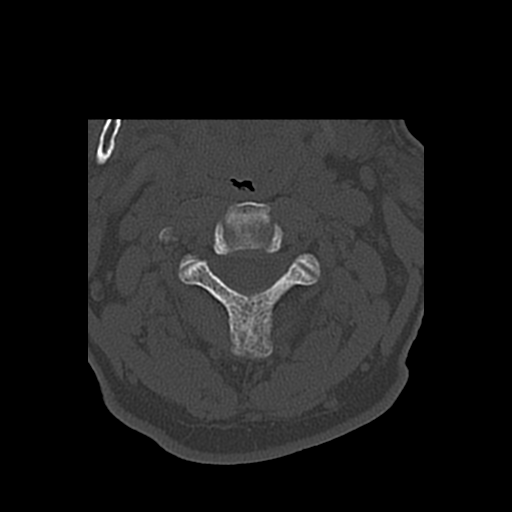

[Series 603: <mpr thick range(1)> · coronal · 0.36mm/px · 3 of 67 slices shown]
[im 14/67  bone]
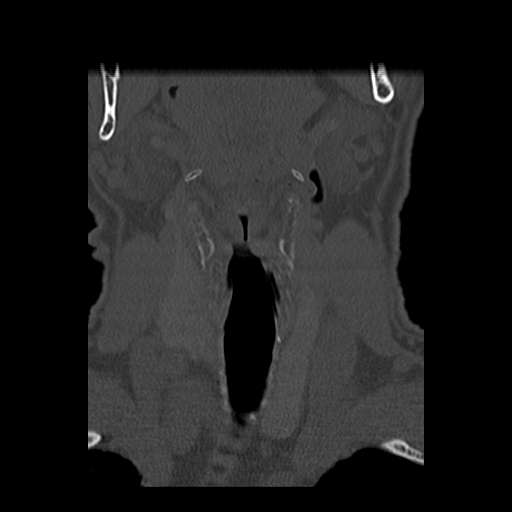
[im 27/67  bone]
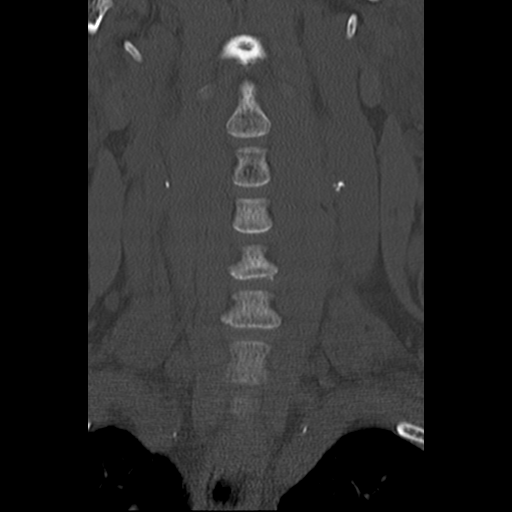
[im 40/67  bone]
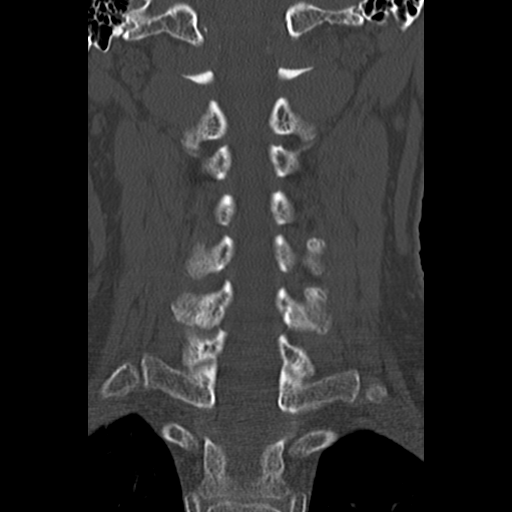

[Series 604: <mpr thick range(2)> · sagittal · 0.36mm/px · 5 of 46 slices shown, 6 images]
[im 16/46  bone]
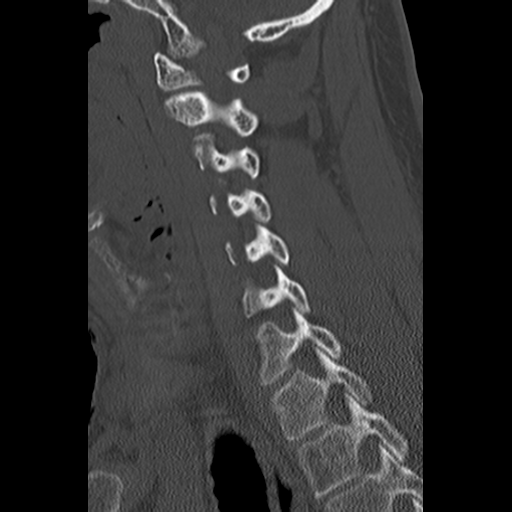
[im 19/46  bone]
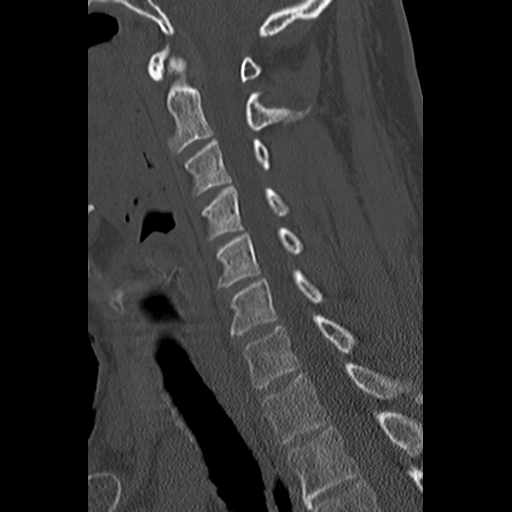
[im 23/46  soft-tissue]
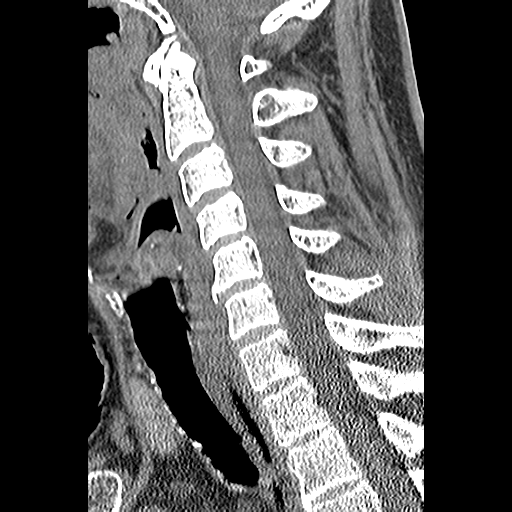
[im 23/46  bone]
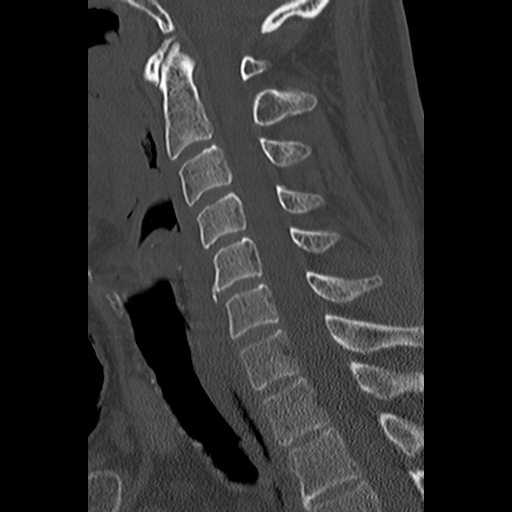
[im 27/46  bone]
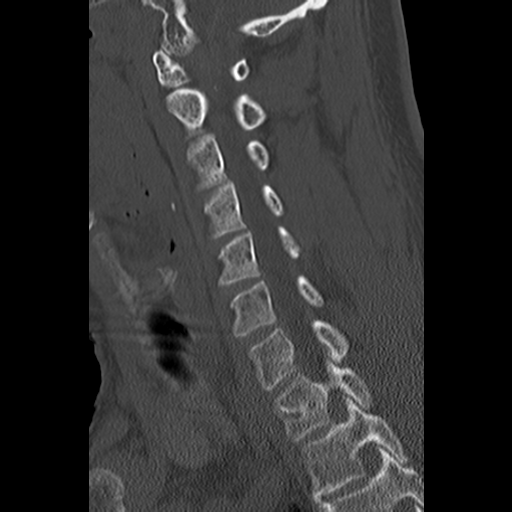
[im 31/46  bone]
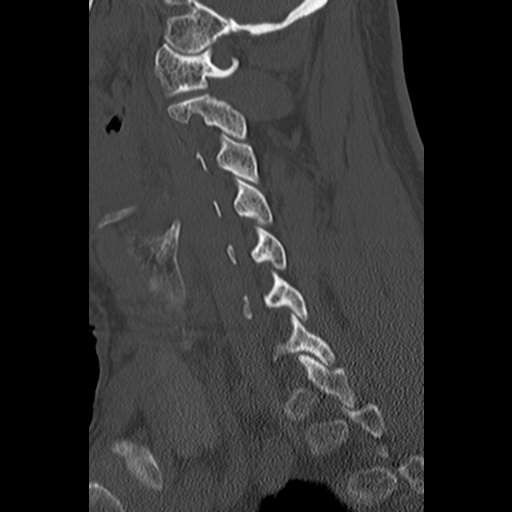

[14 of 33 positions shown; findings below may reference images not displayed]

FINDINGS: Stable moderately advanced small vessel disease in the
periventricular white matter and associated mild atrophy. The brain
demonstrates no evidence of hemorrhage, infarction, edema, mass
effect, extra-axial fluid collection, hydrocephalus or mass lesion.
The skull is unremarkable.
IMPRESSION: No acute findings.  Stable small vessel disease and atrophy.

CT CERVICAL SPINE
FINDINGS: The cervical spine shows normal alignment and no evidence
of fracture or subluxation.  Degenerative changes are seen at C1-2
and C5-6.  No soft tissue swelling or hematoma is identified.  The
visualized airway is normally patent.
IMPRESSION: No evidence of acute cervical spine injury.

## 2015-01-20 ENCOUNTER — Telehealth: Payer: Self-pay | Admitting: Family Medicine

## 2015-01-20 ENCOUNTER — Other Ambulatory Visit: Payer: Self-pay | Admitting: *Deleted

## 2015-01-20 DIAGNOSIS — F411 Generalized anxiety disorder: Secondary | ICD-10-CM

## 2015-01-20 MED ORDER — ESOMEPRAZOLE MAGNESIUM 40 MG PO CPDR: 40.0000 mg | DELAYED_RELEASE_CAPSULE | Freq: Every day | ORAL | Status: DC

## 2015-01-20 MED ORDER — OXYBUTYNIN CHLORIDE 5 MG PO TABS: ORAL_TABLET | ORAL | Status: DC

## 2015-01-20 NOTE — Telephone Encounter (Signed)
Pt is calling back because he said that the doctor called in Depakote. He is unsure why this is being called in since he hasn't taken this in a while. He does need his stomach medication though. jw

## 2015-01-20 NOTE — Telephone Encounter (Signed)
Pt called and needs refills on his Nexium 40 mg the generic and Xanax called in. jw

## 2015-01-20 NOTE — Telephone Encounter (Signed)
Patient is requesting a 90 day supply.   Jazmin Hartsell,CMA  

## 2015-01-20 NOTE — Telephone Encounter (Signed)
Spoke with pt, he is unable to get Nexium filled and is requesting refill on Xanax.  Advised I would call the pharmacy about Nexium and I would also contact MD, but he would likely need an appt because "MD did not mean for him to take a pill two times a day unless NEEDED"  Pt then states that he did need the medication due to his living situation.  Spoke with Maylon Cos ([facility]) and they have tried to get the insurance to pay for medication as a "stolen med" but they will not do so.  Pharmacy has signed form that pt signed to pick up med in October.  They have a discount program but the medication is still over $100. Pharmacy states that if we send in a new script for omeprazole then it may be covered since it is a different medication.   Spoke with Dr. Bonner Puna, he is ok with calling in omeprazole 40mg  once daily.  But is agreeable that pt would need appointment for additional refills (is ok with same day since there is limited availability this month)  Called pt back to explain we were going to call back in a different medication for this month and to make appointment for Xanax.  Pt then tells me that he found his Nexium "it was in a cabinet that he never uses" he is reluctant to an appt tomorrow, but does accept appt with Dr. Bonner Puna tomorrow afternoon.  Called [facility] ([facility]) and let him know that Rx was found. Bohden Dung, Salome Spotted, CMA

## 2015-01-20 NOTE — Telephone Encounter (Signed)
Spoke with pt, he is unable to get Nexium filled and is requesting refill on Xanax. Advised I would call the pharmacy about Nexium and I would also contact MD, but he would likely need an appt because "MD did not mean for him to take a pill two times a day unless NEEDED" Pt then states that he did need the medication due to his living situation.  Spoke with May[facility] Cos ([facility]) and they have tried to get the insurance to pay for medication as a "stolen med" but they will not do so. Pharmacy has signed form that pt signed to pick up med in October. They have a discount program but the medication is still over $100. Pharmacy states that if we send in a new script for omeprazole then it may be covered since it is a different medication.   Spoke with Dr. Bonner Puna, he is ok with calling in omeprazole 40mg  once daily. But is agreeable that pt would need appointment for additional refills (is ok with same day since there is limited availability this month)  Called pt back to explain we were going to call back in a different medication for this month and to make appointment for Xanax. Pt then tells me that he found his Nexium "it was in a cabinet that he never uses" he is reluctant to an appt tomorrow, but does accept appt with Dr. Bonner Puna tomorrow afternoon.  Called [place] ([facility] and let him know that Rx was found. [place], Salome Spotted, CMA

## 2015-01-20 NOTE — Telephone Encounter (Signed)
Received a call from [facility] stating patient has called them crying several times requesting refill on his Xanax.  He has been out of medication since 01/16/2015.  Please advise.  Derl Barrow, RN

## 2015-01-21 ENCOUNTER — Ambulatory Visit (INDEPENDENT_AMBULATORY_CARE_PROVIDER_SITE_OTHER): Payer: Commercial Managed Care - HMO | Admitting: Family Medicine

## 2015-01-21 ENCOUNTER — Encounter: Payer: Self-pay | Admitting: Family Medicine

## 2015-01-21 VITALS — BP 100/50 | HR 75 | Temp 98.0°F | Ht 64.0 in | Wt 226.0 lb

## 2015-01-21 DIAGNOSIS — F411 Generalized anxiety disorder: Secondary | ICD-10-CM

## 2015-01-21 MED ORDER — DIVALPROEX SODIUM ER 250 MG PO TB24: 500.0000 mg | ORAL_TABLET | Freq: Every day | ORAL | Status: DC

## 2015-01-21 MED ORDER — ALPRAZOLAM 1 MG PO TABS: 1.0000 mg | ORAL_TABLET | Freq: Two times a day (BID) | ORAL | Status: DC | PRN

## 2015-01-21 NOTE — Progress Notes (Signed)
Subjective: Patrick Holt is a 64 y.o. male presenting for follow up of anxiety.  He continues to perseverate about anxiety-provoking interactions with the staff and residents of his ALF. Alprazolam has helped with this in the past and so it was prescribed at our last visit, 30 pills on 01/01/2015. These initially showed no improvement when taken once a day so he took them twice daily and finds that it has helped significantly with his emotional lability - cries at very minimal things, and allows him to have conversations with people more easily. He denies SI, HI. No falls, dizziness, palpitations, SOB, diaphoresis.   - Former smoker, lives in ALF  Objective: BP 100/50 mmHg  Pulse 75  Temp(Src) 98 F (36.7 C) (Oral)  Ht 5\' 4"  (1.626 m)  Wt 226 lb (102.513 kg)  BMI 38.77 kg/m2 Gen: Anxious but nontoxic-appearing 64 y.o. male in no distress Psych: Neatly groomed and appropriately dressed. Maintains good eye contact and is cooperative and attentive. Speech is normal volume and rate. Mood is depressed with a mildly restricted affect. Thought process is very tangential and disorganized. Does not appear to be responding to any internal stimuli. Able to maintain train of thought and concentrate on the questions.  Assessment/Plan: [name] is a 64 y.o. male here for anxiety.   Anxiety state Dose of alprazolam BID is sufficient to control symptoms to manageable levels. We will prescribe #60 tablets this time, follow up in 1 month. He has not been taking depakote, though I suspect this may help with his mood disorder so we will start a low dose given age - no hepatic dysfunction.

## 2015-01-21 NOTE — Patient Instructions (Signed)
Thank you for coming in today!  - Let's try alprazolam 1mg  twice a day as needed - you should not take any more than this amount daily.  - Also start a low dose of depakote - 250mg  - once a day to help with your overall mood. This should help with the crying.  - If you feel like you want to harm yourself or others seek medical attention immediately.  - Let's follow up in 1 month to discuss anxiety.    Our clinic's number is [phone]. Feel free to call any time with questions or concerns. We will answer any questions after hours with our 24-hour emergency line at that number as well.   - Dr. Bonner Puna

## 2015-01-21 NOTE — Assessment & Plan Note (Addendum)
Dose of alprazolam BID is sufficient to control symptoms to manageable levels. We will prescribe #60 tablets this time, follow up in 1 month. He has not been taking depakote, though I suspect this may help with his mood disorder so we will start a low dose given age - no hepatic dysfunction.

## 2015-01-27 ENCOUNTER — Emergency Department (HOSPITAL_COMMUNITY)
Admission: EM | Admit: 2015-01-27 | Discharge: 2015-01-28 | Disposition: A | Discharge status: Home / Self Care | Payer: Commercial Managed Care - HMO | Attending: Emergency Medicine | Admitting: Emergency Medicine

## 2015-01-27 ENCOUNTER — Encounter (HOSPITAL_COMMUNITY): Payer: Self-pay | Admitting: Family Medicine

## 2015-01-27 ENCOUNTER — Emergency Department (HOSPITAL_COMMUNITY): Payer: Commercial Managed Care - HMO

## 2015-01-27 DIAGNOSIS — I252 Old myocardial infarction: Secondary | ICD-10-CM | POA: Insufficient documentation

## 2015-01-27 DIAGNOSIS — W19XXXA Unspecified fall, initial encounter: Secondary | ICD-10-CM

## 2015-01-27 DIAGNOSIS — Z87448 Personal history of other diseases of urinary system: Secondary | ICD-10-CM | POA: Insufficient documentation

## 2015-01-27 DIAGNOSIS — Z8669 Personal history of other diseases of the nervous system and sense organs: Secondary | ICD-10-CM | POA: Diagnosis not present

## 2015-01-27 DIAGNOSIS — E785 Hyperlipidemia, unspecified: Secondary | ICD-10-CM | POA: Diagnosis not present

## 2015-01-27 DIAGNOSIS — S29002A Unspecified injury of muscle and tendon of back wall of thorax, initial encounter: Secondary | ICD-10-CM | POA: Diagnosis not present

## 2015-01-27 DIAGNOSIS — K219 Gastro-esophageal reflux disease without esophagitis: Secondary | ICD-10-CM | POA: Diagnosis not present

## 2015-01-27 DIAGNOSIS — Z87442 Personal history of urinary calculi: Secondary | ICD-10-CM | POA: Insufficient documentation

## 2015-01-27 DIAGNOSIS — S3992XA Unspecified injury of lower back, initial encounter: Secondary | ICD-10-CM | POA: Insufficient documentation

## 2015-01-27 DIAGNOSIS — W1839XA Other fall on same level, initial encounter: Secondary | ICD-10-CM | POA: Diagnosis not present

## 2015-01-27 DIAGNOSIS — Z859 Personal history of malignant neoplasm, unspecified: Secondary | ICD-10-CM | POA: Insufficient documentation

## 2015-01-27 DIAGNOSIS — E119 Type 2 diabetes mellitus without complications: Secondary | ICD-10-CM | POA: Diagnosis not present

## 2015-01-27 DIAGNOSIS — Y93E1 Activity, personal bathing and showering: Secondary | ICD-10-CM | POA: Insufficient documentation

## 2015-01-27 DIAGNOSIS — Y92091 Bathroom in other non-institutional residence as the place of occurrence of the external cause: Secondary | ICD-10-CM | POA: Insufficient documentation

## 2015-01-27 DIAGNOSIS — E669 Obesity, unspecified: Secondary | ICD-10-CM | POA: Diagnosis not present

## 2015-01-27 DIAGNOSIS — Z7982 Long term (current) use of aspirin: Secondary | ICD-10-CM | POA: Insufficient documentation

## 2015-01-27 DIAGNOSIS — I10 Essential (primary) hypertension: Secondary | ICD-10-CM | POA: Diagnosis not present

## 2015-01-27 DIAGNOSIS — Z86010 Personal history of colonic polyps: Secondary | ICD-10-CM | POA: Insufficient documentation

## 2015-01-27 DIAGNOSIS — I480 Paroxysmal atrial fibrillation: Secondary | ICD-10-CM | POA: Diagnosis not present

## 2015-01-27 DIAGNOSIS — F419 Anxiety disorder, unspecified: Secondary | ICD-10-CM | POA: Insufficient documentation

## 2015-01-27 DIAGNOSIS — S199XXA Unspecified injury of neck, initial encounter: Secondary | ICD-10-CM | POA: Insufficient documentation

## 2015-01-27 DIAGNOSIS — Z87891 Personal history of nicotine dependence: Secondary | ICD-10-CM | POA: Insufficient documentation

## 2015-01-27 DIAGNOSIS — F319 Bipolar disorder, unspecified: Secondary | ICD-10-CM | POA: Insufficient documentation

## 2015-01-27 DIAGNOSIS — Z791 Long term (current) use of non-steroidal anti-inflammatories (NSAID): Secondary | ICD-10-CM | POA: Insufficient documentation

## 2015-01-27 DIAGNOSIS — Z880 Allergy status to penicillin: Secondary | ICD-10-CM | POA: Insufficient documentation

## 2015-01-27 DIAGNOSIS — Y998 Other external cause status: Secondary | ICD-10-CM | POA: Insufficient documentation

## 2015-01-27 DIAGNOSIS — Z862 Personal history of diseases of the blood and blood-forming organs and certain disorders involving the immune mechanism: Secondary | ICD-10-CM | POA: Insufficient documentation

## 2015-01-27 DIAGNOSIS — J449 Chronic obstructive pulmonary disease, unspecified: Secondary | ICD-10-CM | POA: Insufficient documentation

## 2015-01-27 DIAGNOSIS — Z79899 Other long term (current) drug therapy: Secondary | ICD-10-CM | POA: Insufficient documentation

## 2015-01-27 DIAGNOSIS — M069 Rheumatoid arthritis, unspecified: Secondary | ICD-10-CM | POA: Insufficient documentation

## 2015-01-27 MED ORDER — NAPROXEN 250 MG PO TABS
500.0000 mg | ORAL_TABLET | Freq: Once | ORAL | Status: DC
  Filled 2015-01-27: qty 2

## 2015-01-27 NOTE — ED Provider Notes (Signed)
CSN: [id number]     Arrival date & time 01/27/15  1856 History   First MD Initiated Contact with Patient 01/27/15 2259     Chief Complaint  Patient presents with  . Fall     (Consider location/radiation/quality/duration/timing/severity/associated sxs/prior Treatment) Patient is a 64 y.o. male presenting with fall. The history is provided by the patient.  Fall This is a new problem. The current episode started in the past 7 days. The problem occurs intermittently. The problem has been unchanged. Associated symptoms include neck pain. Pertinent negatives include no chills, fever, numbness or weakness. The symptoms are aggravated by exertion. He has tried nothing for the symptoms. The treatment provided no relief.    Past Medical History  Diagnosis Date  . Rheumatoid arthritis(714.0)   . Obesity   . Major depression ([place])     with acute psychotic break in 06/2010  . Hypertension   . Hyperlipidemia   . Diverticulosis of colon   . COPD (chronic obstructive pulmonary disease) ([place])   . Anxiety   . GERD (gastroesophageal reflux disease)   . Vertigo   . Fibromyalgia   . Dermatomyositis ([place])   . Myocardial infarct ([place])     mulitple (1999, 2000, 2004)  . Raynaud's disease   . Narcotic dependence ([place])   . Peripheral neuropathy ([place])   . Internal hemorrhoids   . Ischemic heart disease   . Hiatal hernia   . Gastritis   . Diverticulitis   . Hx of adenomatous colonic polyps   . Nephrolithiasis   . Anemia   . Esophageal stricture   . Esophageal dysmotility   . Dermatomyositis ([place])   . Urge incontinence   . Otosclerosis   . Bipolar 1 disorder ([place])   . OCD (obsessive compulsive disorder)   . Sarcoidosis ([place])   . Paroxysmal a-fib ([place])   . Dysrhythmia     "irregular" (11/15/2012)  . Type II diabetes mellitus ([place])   . Seizures ([place])   . Headache(784.0)     "severe; get them often" (11/15/2012)  . Subarachnoid hemorrhage ([place]) 01/2012    with subdural  hematoma.   . Atrial  fibrillation ([place]) 01/2012    with RVR  . Conversion disorder 06/2010  . History of narcotic addiction ([place])   . Asthma   . Cancer ([place])   . Collagen vascular disease ([place])   . Renal insufficiency    Past Surgical History  Procedure Laterality Date  . Knee arthroscopy w/ meniscal repair Left 2009  . Lumbar disc surgery    . Squamous papilloma   2010    removed by Dr. Constance Holster ENT, noted on tongue  . Esophagogastroduodenoscopy N/A 09/27/2012    Procedure: ESOPHAGOGASTRODUODENOSCOPY (EGD);  Surgeon: Lafayette Dragon, MD;  Location: [facility];  Service: Endoscopy;  Laterality: N/A;  . Colonoscopy N/A 09/27/2012    Procedure: COLONOSCOPY;  Surgeon: Lafayette Dragon, MD;  Location: [facility];  Service: Endoscopy;  Laterality: N/A;  . Cataract extraction w/ intraocular lens implant Left   . Lymph node dissection Right 1970's    "neck; dr thought I had Hodgkins; turned out to be sarcoidosis" (11/15/2012)  . Tonsillectomy    . Carpal tunnel release Bilateral   . Cardiac catheterization    . Back surgery     Family History  Problem Relation Age of Onset  . Alcohol abuse Mother   . Depression Mother   . Heart disease Mother   . Diabetes Mother   . Stroke Mother   .  Diabetes Other     1/2 brother  . Hepatitis Brother   . Coronary artery disease Father    Social History  Substance Use Topics  . Smoking status: Former Smoker -- 0.50 packs/day for 30 years    Types: Cigarettes    Quit date: 12/15/2014  . Smokeless tobacco: Never Used     Comment: 3 cigs a day  . Alcohol Use: No     Comment: Alcohol stopped in September of 2014    Review of Systems  Constitutional: Negative for fever and chills.  HENT: Negative.   Eyes: Negative.   Respiratory: Negative.   Cardiovascular: Negative.   Gastrointestinal: Negative.   Endocrine: Negative.   Musculoskeletal: Positive for back pain and neck pain.  Skin: Negative for wound.  Allergic/Immunologic: Negative.   Neurological: Negative for  dizziness, speech difficulty, weakness and numbness.  All other systems reviewed and are negative.     Allergies  Immune globulins; Ciprofloxacin; Flagyl; Lisinopril; Sulfa antibiotics; Betamethasone dipropionate; Bupropion hcl; Clobetasol; Codeine; Escitalopram oxalate; Fluoxetine hcl; Fluticasone-salmeterol; Furosemide; Paroxetine; Penicillins; Tacrolimus; Tetanus toxoid; and Tuberculin purified protein derivative  Home Medications   Prior to Admission medications   Medication Sig Start Date End Date Taking? Authorizing Provider  albuterol-ipratropium (COMBIVENT) 18-103 MCG/ACT inhaler Inhale 1 puff into the lungs every 6 (six) hours as needed for wheezing or shortness of breath. 12/03/14   Patrecia Pour, MD  ALPRAZolam Duanne Moron) 1 MG tablet Take 1 tablet (1 mg total) by mouth 2 (two) times daily as needed for anxiety. 01/21/15   Patrecia Pour, MD  amiodarone (PACERONE) 200 MG tablet Take 0.5 tablets (100 mg total) by mouth daily. 11/20/14   Fay Records, MD  aspirin EC 81 MG tablet Take 81 mg by mouth daily.    Historical Provider, MD  atorvastatin (LIPITOR) 40 MG tablet Take 1 tablet (40 mg total) by mouth daily. 11/11/14   Fay Records, MD  benzonatate (TESSALON) 100 MG capsule Take 1 capsule (100 mg total) by mouth every 8 (eight) hours. 12/30/14   Shawn C Joy, PA-C  divalproex (DEPAKOTE ER) 250 MG 24 hr tablet Take 2 tablets (500 mg total) by mouth daily. 01/21/15   Patrecia Pour, MD  esomeprazole (NEXIUM) 40 MG capsule Take 1 capsule (40 mg total) by mouth daily at 12 noon. 01/20/15   Patrecia Pour, MD  guaiFENesin (MUCINEX) 600 MG 12 hr tablet Take 600 mg by mouth 2 (two) times daily. OTC    Historical Provider, MD  levETIRAcetam (KEPPRA) 500 MG tablet TAKE 1 TABLET(500 MG) BY MOUTH TWICE DAILY 07/31/14   Renee A Kuneff, DO  loperamide (IMODIUM) 2 MG capsule TAKE 1 TO 2 CAPSULES BY MOUTH 3 TIMES A DAY AS NEEDED FOR DIARRHEA 11/15/14   Patrecia Pour, MD  meclizine (ANTIVERT) 25 MG tablet TAKE 1  TABLET (25 MG TOTAL) BY MOUTH 3 (THREE) TIMES DAILY AS NEEDED. 11/15/14   Historical Provider, MD  Multiple Vitamin (MULTIVITAMIN WITH MINERALS) TABS tablet Take 1 tablet by mouth daily.    Historical Provider, MD  naproxen (NAPROSYN) 500 MG tablet Take 1 tablet (500 mg total) by mouth 2 (two) times daily with a meal. 01/28/15   Junius Creamer, NP  NEXIUM 40 MG capsule TAKE ONE CAPSULE BY MOUTH EVERY MORNING WITH BREAKFAST 03/07/14   Renee A Kuneff, DO  oxybutynin (DITROPAN) 5 MG tablet TAKE 1 TABLET (5 MG TOTAL) BY MOUTH 3 (THREE) TIMES DAILY. 01/20/15   Patrecia Pour,  MD  Potassium Chloride ER 20 MEQ TBCR Take 1 tablet by mouth every other day.  11/29/14   Historical Provider, MD  rOPINIRole (REQUIP) 2 MG tablet Take 0.5 tablets (1 mg total) by mouth at bedtime. 12/03/14   Patrecia Pour, MD   BP 154/84 mmHg  Pulse 72  Temp(Src) 98.8 F (37.1 C) (Oral)  Resp 16  SpO2 94% Physical Exam  Constitutional: He appears well-developed and well-nourished.  Eyes: Pupils are equal, round, and reactive to light.  Neck: Normal range of motion. Neck supple. Spinous process tenderness and muscular tenderness present. Normal range of motion present.  Cardiovascular: Normal rate.   Pulmonary/Chest: Effort normal.  Abdominal: Soft. He exhibits no distension. There is no tenderness.  Musculoskeletal: He exhibits tenderness. He exhibits no edema.       Back:  Neurological: He is alert.  Skin: Skin is warm. No erythema.  Nursing note and vitals reviewed.   ED Course  Procedures (including critical care time) Labs Review Labs Reviewed - No data to display  Imaging Review Dg Thoracic Spine 2 View  01/28/2015  CLINICAL DATA:  Status post fall, with upper back pain. Initial encounter. EXAM: THORACIC SPINE 2 VIEWS COMPARISON:  Chest radiograph performed 12/30/2014 FINDINGS: There is no evidence of fracture or subluxation. Vertebral bodies demonstrate normal height and alignment. Intervertebral disc spaces are  preserved. Mild lateral osteophytes are noted along the thoracic spine. The visualized portions of both lungs are clear. The mediastinum is unremarkable in appearance. IMPRESSION: No evidence of fracture or subluxation along the thoracic spine. Electronically Signed   By: Garald Balding M.D.   On: 01/28/2015 00:01   Dg Lumbar Spine 2-3 Views  01/27/2015  CLINICAL DATA:  Initial encounter for Pt fell in bathroom today pain all over EXAM: LUMBAR SPINE - 2-3 VIEW COMPARISON:  MRI on 07/26/2014 FINDINGS: Dense aortic atherosclerosis. Five lumbar type vertebral bodies. Maintenance of vertebral body height. Grade 1 L5-S1 anterolisthesis is similar. Spondylosis, including degenerative disc disease at the lumbosacral junction and L2-3 level. Facet arthropathy at multiple lumbar levels. IMPRESSION: Advanced spondylosis, without acute vertebral body height loss. Similar grade 1 L5-S1 anterolisthesis. Advanced aortic atherosclerosis. Electronically Signed   By: Abigail Miyamoto M.D.   On: 01/27/2015 23:56   Ct Cervical Spine Wo Contrast  01/28/2015  CLINICAL DATA:  Patient fell in the shower on Friday. Neck pain, back pain, and right elbow abrasion P 8 bilateral knee pain. Posterior neck pain and swelling. EXAM: CT CERVICAL SPINE WITHOUT CONTRAST TECHNIQUE: Multidetector CT imaging of the cervical spine was performed without intravenous contrast. Multiplanar CT image reconstructions were also generated. COMPARISON:  06/28/2014 FINDINGS: Normal alignment of the cervical spine. Mild degenerative endplate hypertrophic changes at C5-6 and C6-7. No vertebral compression deformities. No prevertebral soft tissue swelling. C1-2 articulation appears intact. No focal bone lesion or bone destruction. Bone cortex and trabecular architecture appear intact. Old fracture of the spinous process of T1. Vascular calcifications. Mild focal infiltration in the right lung apex. IMPRESSION: Normal alignment of the cervical spine. Mild  degenerative changes. Old fracture of the spinous process of T1. No acute displaced fractures identified. Electronically Signed   By: Lucienne Capers M.D.   On: 01/28/2015 00:29   I have personally reviewed and evaluated these images and lab results as part of my medical decision-making.   EKG Interpretation None      MDM   Final diagnoses:  Fall, initial encounter         Baker Janus  Olean Ree, NP 01/28/15 [id number]  Blanchie Dessert, MD 01/29/15 2130

## 2015-01-27 NOTE — ED Notes (Signed)
Pt here for fall on Friday. sts was in the shower and fell flat on back. sts neck pain, back pain and rt elbow abrasion. sts bilateral knee pain

## 2015-01-28 ENCOUNTER — Other Ambulatory Visit: Payer: Self-pay

## 2015-01-28 ENCOUNTER — Emergency Department (HOSPITAL_COMMUNITY): Payer: Commercial Managed Care - HMO

## 2015-01-28 ENCOUNTER — Other Ambulatory Visit (HOSPITAL_COMMUNITY): Payer: Self-pay

## 2015-01-28 MED ORDER — NAPROXEN 500 MG PO TABS: 500.0000 mg | ORAL_TABLET | Freq: Two times a day (BID) | ORAL | Status: DC

## 2015-01-28 NOTE — ED Notes (Signed)
PTAR Called @ [phone].

## 2015-01-28 NOTE — Patient Outreach (Signed)
[facility]) Care Management  01/28/2015  SHYKEIM SOLE 1950/07/14 [id number]  Telephone call to patient regarding [employer] high risk referral.  Unable to reach patient. HIPAA compliant voice message left with call back phone number.   PLAN; RNCM will attempt 2nd telephone call to patient within 1 week.  Quinn Plowman RN,BSN,CCM [place] Coordinator [phone]

## 2015-01-28 NOTE — ED Notes (Signed)
Pt escorted to pod C to await PTAR. Requesting taxi voucher, RN unable to provide at this time per department rules.

## 2015-01-28 NOTE — Discharge Instructions (Signed)
Fortunately all your x rays are normal  Follow up with your PCP as needed

## 2015-01-28 NOTE — ED Notes (Signed)
Patient ambulated to nurse's station questioning as to the delay for PTAR for transportation, then ambulated back to his room without assistance. No distress noted.

## 2015-02-03 ENCOUNTER — Other Ambulatory Visit: Payer: Self-pay

## 2015-02-03 NOTE — Patient Outreach (Signed)
[facility]) Care Management  02/03/2015  Patrick Holt 09/07/1950 [id number]   Telephone call to patient regarding [employer] high risk referral.  HIPAA verified with patient. Discussed and offered [facility] care management services.  Patient refused services at this time stating he is switching insurances at the beginning of the year.  Patient agreed to receive [facility] care management outreach letter and packet for future needs.   PLAN; RNCM will refer patient to  Damita Rhodie to close due to refusal of services. ] RNCM will notify patients primary MD of refusal of services.  RNCM will send patient [facility] outreach letter and brochure.  Quinn Plowman RN,BSN,CCM [place] Coordinator [phone]

## 2015-02-12 ENCOUNTER — Ambulatory Visit: Payer: Commercial Managed Care - HMO | Admitting: Family Medicine

## 2015-02-25 ENCOUNTER — Encounter: Payer: Self-pay | Admitting: Family Medicine

## 2015-02-25 ENCOUNTER — Ambulatory Visit (INDEPENDENT_AMBULATORY_CARE_PROVIDER_SITE_OTHER): Payer: Medicare Other | Admitting: Family Medicine

## 2015-02-25 VITALS — Temp 98.2°F | Ht 64.0 in | Wt 221.0 lb

## 2015-02-25 DIAGNOSIS — M5410 Radiculopathy, site unspecified: Secondary | ICD-10-CM

## 2015-02-25 DIAGNOSIS — I10 Essential (primary) hypertension: Secondary | ICD-10-CM | POA: Diagnosis not present

## 2015-02-25 DIAGNOSIS — R351 Nocturia: Secondary | ICD-10-CM

## 2015-02-25 DIAGNOSIS — F411 Generalized anxiety disorder: Secondary | ICD-10-CM

## 2015-02-25 MED ORDER — SILODOSIN 4 MG PO CAPS: 4.0000 mg | ORAL_CAPSULE | Freq: Every day | ORAL | Status: DC

## 2015-02-25 MED ORDER — ALPRAZOLAM 1 MG PO TABS: 1.0000 mg | ORAL_TABLET | Freq: Two times a day (BID) | ORAL | Status: DC | PRN

## 2015-02-25 MED ORDER — OXYBUTYNIN CHLORIDE 5 MG PO TABS: ORAL_TABLET | ORAL | Status: DC

## 2015-02-25 NOTE — Assessment & Plan Note (Signed)
Above goal at 146/74 today similar to last OV, but prior to that was in 90s-100 / 69s - 78. Will revisit at follow up.

## 2015-02-25 NOTE — Patient Instructions (Signed)
Thank you for coming in today!   - Please consider speaking with a surgeon about your back. Call me and let me know what you decide. There is not medicine that can help this.  - I have refilled your oxybutynin to be taken 3 times per day, and rapaflo though this may be expensive. Please follow up with the urologist to discuss this.    Our clinic's number is [phone]. Feel free to call any time with questions or concerns. We will answer any questions after hours with our 24-hour emergency line at that number as well.   - Dr. Bonner Puna

## 2015-02-25 NOTE — Assessment & Plan Note (Signed)
Grade 1 L5-S1 anterolisthesis and advanced spondylosis at the lumbosacral junction and L2-3 level noted on XR 01/28/2015. Advised that if pain medications are not helping and the condition is worsening, he should consider discussing spinal fusion with surgeon. He will consider this and let me know if he wishes for surgical referral. Symptomatic treatments reviewed.

## 2015-02-25 NOTE — Progress Notes (Signed)
Subjective: Patrick Holt is a 65 y.o. male presenting for back pain.  He endorses ongoing aching lower back pain in the middle without sciatic radiation for the past several months, worsened significantly after a fall in December. He went to the ED after this where scans were negative for acute fracture, though lumber XR showed advanced spondylosis without vertebral body height loss and grade 1 anterolisthesis at L5 - S1. He has tried OTC medications without relief, IV pain medications only provided relief temporarily and he wishes not to use these. He refused pain management and has been told he should have surgery but is not sure about this. Has not fallen since that time.   Urology, Dr. Matilde Sprang, added rapaflo (gave samples, though he is out of them) to oxybutynin and  recommended "reasonable treatment goals" which I think is to the point. Patrick Holt says this seemed to help somewhat at first, less so now. He still urinates "dozens" of times per night. Urology noted that neurogenic bladder is not suspected.   Anxiety is stable, taking xanax twice a day is allowing him to socialize intermittently.   - ROS: Pt denies any current bowel/bladder problems, fever, chills, unintentional weight loss, night time awakenings secondary to pain (just urination), weakness in one or both legs - Non-smoker  Objective: Temp(Src) 98.2 F (36.8 C) (Oral)  Ht 5\' 4"  (1.626 m)  Wt 221 lb (100.245 kg)  BMI 37.92 kg/m2 Gen: Well-appearing 65 y.o. male in no distress Back:  Normal skin. Spine with normal alignment and no palpable step-offs. No tenderness to vertebral process palpation. Paraspinous muscles are tender without spasm.   Range of motion is full at neck and severely limited at lumbar sacral regions. Straight leg raise is positive/limited by diminished ROM. Neuro:  Sensation and motor function 5/5 bilateral lower extremities. Patellar and achilles DTR's 2+  Grade 1 L5-S1 anterolisthesis and  advanced spondylosis at the lumbosacral junction and L2-3 level noted on XR 01/28/2015.  Assessment/Plan: Patrick Holt is a 65 y.o. male here for back pain and ongoing urologic issues.  Essential hypertension, benign Above goal at 146/74 today similar to last OV, but prior to that was in 90s-100 / 25s - 78. Will revisit at follow up.   Anxiety state Stable, refilled xanax today with caveat that this should be used only as needed for anxiety.  Nocturia Stable, slightly improved per pt report with addition of rapaflo, though he is out of the samples he was given. I will refill this, though I'm not certain about expense to him, and oxybutynin. He will follow up with urology on 2/1.  Radicular low back pain Grade 1 L5-S1 anterolisthesis and advanced spondylosis at the lumbosacral junction and L2-3 level noted on XR 01/28/2015. Advised that if pain medications are not helping and the condition is worsening, he should consider discussing spinal fusion with surgeon. He will consider this and let me know if he wishes for surgical referral. Symptomatic treatments reviewed.

## 2015-02-25 NOTE — Assessment & Plan Note (Signed)
Stable, slightly improved per pt report with addition of rapaflo, though he is out of the samples he was given. I will refill this, though I'm not certain about expense to him, and oxybutynin. He will follow up with urology on 2/1.

## 2015-02-25 NOTE — Assessment & Plan Note (Signed)
Stable, refilled xanax today with caveat that this should be used only as needed for anxiety.

## 2015-02-26 ENCOUNTER — Telehealth: Payer: Self-pay | Admitting: Family Medicine

## 2015-02-26 DIAGNOSIS — M5410 Radiculopathy, site unspecified: Secondary | ICD-10-CM

## 2015-02-26 NOTE — Telephone Encounter (Signed)
Will forward to MD to place referral. Tyion Boylen,CMA  

## 2015-02-26 NOTE — Telephone Encounter (Signed)
Pt called because he is getting a referral to see a back surgeon. He has [employer] and wants to make sure that the surgeon is in his network. He also does not want to see the surgeon he seen in the past. jw

## 2015-02-28 NOTE — Telephone Encounter (Signed)
Pt calling once more about this. Pt states that there was a phone call to him earlier but no messages were left. Please advise, Fonda Kinder, ASA

## 2015-03-03 ENCOUNTER — Other Ambulatory Visit: Payer: Self-pay | Admitting: Family Medicine

## 2015-03-03 NOTE — Telephone Encounter (Signed)
Spoke to pt. Informed him the referral is in the system and he will be getting a call from the office in the next few days. He then asked if he could get a refill on the imodium. Please advise. Ottis Stain, CMA

## 2015-03-04 NOTE — Telephone Encounter (Signed)
LVM for pt to call our office. If pt calls, please inform him that the Imodium has been called in. Ottis Stain, CMA

## 2015-03-04 NOTE — Telephone Encounter (Signed)
Pt called and said that he needs a refill on his Imodium called in. jw

## 2015-03-07 ENCOUNTER — Ambulatory Visit (INDEPENDENT_AMBULATORY_CARE_PROVIDER_SITE_OTHER): Payer: Medicare Other | Admitting: Family Medicine

## 2015-03-07 ENCOUNTER — Encounter: Payer: Self-pay | Admitting: Family Medicine

## 2015-03-07 ENCOUNTER — Ambulatory Visit: Payer: Self-pay | Admitting: Family Medicine

## 2015-03-07 VITALS — BP 149/106 | HR 71 | Temp 98.1°F | Wt 221.0 lb

## 2015-03-07 DIAGNOSIS — Y92009 Unspecified place in unspecified non-institutional (private) residence as the place of occurrence of the external cause: Secondary | ICD-10-CM | POA: Insufficient documentation

## 2015-03-07 DIAGNOSIS — M4716 Other spondylosis with myelopathy, lumbar region: Secondary | ICD-10-CM | POA: Insufficient documentation

## 2015-03-07 DIAGNOSIS — M545 Low back pain: Secondary | ICD-10-CM | POA: Diagnosis not present

## 2015-03-07 DIAGNOSIS — R531 Weakness: Secondary | ICD-10-CM | POA: Diagnosis not present

## 2015-03-07 DIAGNOSIS — Y92099 Unspecified place in other non-institutional residence as the place of occurrence of the external cause: Secondary | ICD-10-CM

## 2015-03-07 DIAGNOSIS — M5416 Radiculopathy, lumbar region: Secondary | ICD-10-CM | POA: Diagnosis not present

## 2015-03-07 DIAGNOSIS — W19XXXA Unspecified fall, initial encounter: Secondary | ICD-10-CM | POA: Diagnosis not present

## 2015-03-07 NOTE — Assessment & Plan Note (Signed)
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 Due to recurrent fall in addition to his back pain I recommended PT assessment. Patient does not seem to be on board with it. I discussed the reason why this might be beneficial for him to prevent recurrent fall at home and they can also assess him for a wheel chair. PT referral placed. My guess is that he will not allow PT appointment to be scheduled when he is contacted based on the conversation I had with him today.

## 2015-03-07 NOTE — Patient Instructions (Signed)
Fall Prevention in the Home  Falls can cause injuries and can affect people from all age groups. There are many simple things that you can do to make your home safe and to help prevent falls. WHAT CAN I DO ON THE OUTSIDE OF MY HOME?  Regularly repair the edges of walkways and driveways and fix any cracks.  Remove high doorway thresholds.  Trim any shrubbery on the main path into your home.  Use bright outdoor lighting.  Clear walkways of debris and clutter, including tools and rocks.  Regularly check that handrails are securely fastened and in good repair. Both sides of any steps should have handrails.  Install guardrails along the edges of any raised decks or porches.  Have leaves, snow, and ice cleared regularly.  Use sand or salt on walkways during winter months.  In the garage, clean up any spills right away, including grease or oil spills. WHAT CAN I DO IN THE BATHROOM?  Use night lights.  Install grab bars by the toilet and in the tub and shower. Do not use towel bars as grab bars.  Use non-skid mats or decals on the floor of the tub or shower.  If you need to sit down while you are in the shower, use a plastic, non-slip stool..  Keep the floor dry. Immediately clean up any water that spills on the floor.  Remove soap buildup in the tub or shower on a regular basis.  Attach bath mats securely with double-sided non-slip rug tape.  Remove throw rugs and other tripping hazards from the floor. WHAT CAN I DO IN THE BEDROOM?  Use night lights.  Make sure that a bedside light is easy to reach.  Do not use oversized bedding that drapes onto the floor.  Have a firm chair that has side arms to use for getting dressed.  Remove throw rugs and other tripping hazards from the floor. WHAT CAN I DO IN THE KITCHEN?   Clean up any spills right away.  Avoid walking on wet floors.  Place frequently used items in easy-to-reach places.  If you need to reach for something  above you, use a sturdy step stool that has a grab bar.  Keep electrical cables out of the way.  Do not use floor polish or wax that makes floors slippery. If you have to use wax, make sure that it is non-skid floor wax.  Remove throw rugs and other tripping hazards from the floor. WHAT CAN I DO IN THE STAIRWAYS?  Do not leave any items on the stairs.  Make sure that there are handrails on both sides of the stairs. Fix handrails that are broken or loose. Make sure that handrails are as long as the stairways.  Check any carpeting to make sure that it is firmly attached to the stairs. Fix any carpet that is loose or worn.  Avoid having throw rugs at the top or bottom of stairways, or secure the rugs with carpet tape to prevent them from moving.  Make sure that you have a light switch at the top of the stairs and the bottom of the stairs. If you do not have them, have them installed. WHAT ARE SOME OTHER FALL PREVENTION TIPS?  Wear closed-toe shoes that fit well and support your feet. Wear shoes that have rubber soles or low heels.  When you use a stepladder, make sure that it is completely opened and that the sides are firmly locked. Have someone hold the ladder while you   are using it. Do not climb a closed stepladder.  Add color or contrast paint or tape to grab bars and handrails in your home. Place contrasting color strips on the first and last steps.  Use mobility aids as needed, such as canes, walkers, scooters, and crutches.  Turn on lights if it is dark. Replace any light bulbs that burn out.  Set up furniture so that there are clear paths. Keep the furniture in the same spot.  Fix any uneven floor surfaces.  Choose a carpet design that does not hide the edge of steps of a stairway.  Be aware of any and all pets.  Review your medicines with your healthcare provider. Some medicines can cause dizziness or changes in blood pressure, which increase your risk of falling. Talk  with your health care provider about other ways that you can decrease your risk of falls. This may include working with a physical therapist or trainer to improve your strength, balance, and endurance.   This information is not intended to replace advice given to you by your health care provider. Make sure you discuss any questions you have with your health care provider.   Document Released: 01/22/2002 Document Revised: 06/18/2014 Document Reviewed: 03/08/2014 Elsevier Interactive Patient Education 2016 Elsevier Inc.  

## 2015-03-07 NOTE — Assessment & Plan Note (Addendum)
No acute change in symptoms it seems. He is set up with a spine specialist. Discussion with patient today was confusing.  He asked for referral to a spine specialist and I told him he already has referral per record and documentation to Dr. Towanda Malkin office. He then stated he needed his xray to be faxed to him and i again informed him that according to documentation this has already been done. He then asked for a copy of all his imaging since 2014 or more. I asked the CMA to help print him a copy. I discussed his initial request for wheel chair prescription with him, I recommended PT referral for wheel chair assessment. He then stated that he will get wheel chair from his spine surgeon. He does not want to go to the PT. At this point for his back, he is to continue current pain management plan. It seems he is on NSAID. F/U with Spine specialist as planned. See PCP in 1-2 wks for reassessment and to continue management.

## 2015-03-07 NOTE — Progress Notes (Signed)
Subjective:     Patient ID: Patrick Holt, male   DOB: 1951/02/06, 65 y.o.   MRN: [id number]  HPI Back pain/Fall: Patient initially came in with request for wheel chair evaluation, he stated he has a spinal fusion surgery scheduled for March 8th and he anticipates that he is going to be needing a wheel chair after surgery. He endorsed hx lower back pain since 2004. Back pain is more than 10/10 in severity, worse with ambulation and change in position. He denies recent trauma to his back but he had fallen multiple times at home. It affects his ambulation. He can't bend over to pick up stuff from the floor.  He does not use meds for pain. He was on Oxycodone. He endorsed falling at home frequently, his last fall at home was 1 month ago. [facility] [place] [facility]. Patient gave a lot of confusing hx and at times I was unable to understand the message he is trying to pass across to me. 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  Current Outpatient Prescriptions on File Prior to Visit  Medication Sig Dispense Refill  . albuterol-ipratropium (COMBIVENT) 18-103 MCG/ACT inhaler Inhale 1 puff into the lungs every 6 (six) hours as needed for wheezing or shortness of breath. 14.7 g 5  . ALPRAZolam (XANAX) 1 MG tablet Take 1 tablet (1 mg total) by mouth 2 (two) times daily as needed for anxiety. 60 tablet 0  . amiodarone (PACERONE) 200 MG tablet Take 0.5 tablets (100 mg total) by mouth daily. 90 tablet 3  . aspirin EC 81 MG tablet Take 81 mg by mouth daily.    Marland Kitchen atorvastatin (LIPITOR) 40 MG tablet Take 1 tablet (40 mg total) by mouth daily. 90 tablet 3  . benzonatate (TESSALON) 100 MG capsule Take 1 capsule (100 mg total) by mouth every 8 (eight) hours. 21 capsule 0  . divalproex (DEPAKOTE ER) 250 MG 24 hr tablet Take 2 tablets (500 mg total) by mouth daily. 30 tablet 2  . esomeprazole (NEXIUM) 40 MG capsule Take 1 capsule (40  mg total) by mouth daily at 12 noon. 90 capsule 0  . guaiFENesin (MUCINEX) 600 MG 12 hr tablet Take 600 mg by mouth 2 (two) times daily. OTC    . levETIRAcetam (KEPPRA) 500 MG tablet TAKE 1 TABLET(500 MG) BY MOUTH TWICE DAILY 180 tablet 0  . loperamide (IMODIUM) 2 MG capsule TAKE 1 TO 2 CAPSULES BY MOUTH 3 TIMES A DAY AS NEEDED FOR DIARRHEA 60 capsule 0  . meclizine (ANTIVERT) 25 MG tablet TAKE 1 TABLET (25 MG TOTAL) BY MOUTH 3 (THREE) TIMES DAILY AS NEEDED.  0  . Multiple Vitamin (MULTIVITAMIN WITH MINERALS) TABS tablet Take 1 tablet by mouth daily.    . naproxen (NAPROSYN) 500 MG tablet Take 1 tablet (500 mg total) by mouth 2 (two) times daily with a meal. 30 tablet 0  . NEXIUM 40 MG capsule TAKE ONE CAPSULE BY MOUTH EVERY MORNING WITH BREAKFAST 90 capsule 3  . oxybutynin (DITROPAN) 5 MG tablet TAKE 1 TABLET (5 MG TOTAL) BY MOUTH 3 (THREE) TIMES DAILY. 90 tablet 2  . Potassium Chloride ER 20 MEQ TBCR Take 1 tablet by mouth every other day.   0  . rOPINIRole (REQUIP) 2 MG tablet Take 0.5 tablets (1 mg total) by mouth at bedtime. 90 tablet 2  . silodosin (RAPAFLO) 4 MG CAPS capsule Take  1 capsule (4 mg total) by mouth daily with breakfast. 30 capsule 0   No current facility-administered medications on file prior to visit.   Past Medical History  Diagnosis Date  . Rheumatoid arthritis(714.0)   . Obesity   . Major depression ([place])     with acute psychotic break in 06/2010  . Hypertension   . Hyperlipidemia   . Diverticulosis of colon   . COPD (chronic obstructive pulmonary disease) ([place])   . Anxiety   . GERD (gastroesophageal reflux disease)   . Vertigo   . Fibromyalgia   . Dermatomyositis ([place])   . Myocardial infarct ([place])     mulitple (1999, 2000, 2004)  . Raynaud's disease   . Narcotic dependence ([place])   . Peripheral neuropathy ([place])   . Internal hemorrhoids   . Ischemic heart disease   . Hiatal hernia   . Gastritis   . Diverticulitis   . Hx of adenomatous colonic polyps   .  Nephrolithiasis   . Anemia   . Esophageal stricture   . Esophageal dysmotility   . Dermatomyositis ([place])   . Urge incontinence   . Otosclerosis   . Bipolar 1 disorder ([place])   . OCD (obsessive compulsive disorder)   . Sarcoidosis ([place])   . Paroxysmal a-fib ([place])   . Dysrhythmia     "irregular" (11/15/2012)  . Type II diabetes mellitus ([place])   . Seizures ([place])   . Headache(784.0)     "severe; get them often" (11/15/2012)  . Subarachnoid hemorrhage ([place]) 01/2012    with subdural  hematoma.   . Atrial fibrillation ([place]) 01/2012    with RVR  . Conversion disorder 06/2010  . History of narcotic addiction ([place])   . Asthma   . Cancer ([place])   . Collagen vascular disease ([place])   . Renal insufficiency      Review of Systems  Respiratory: Negative.   Cardiovascular: Negative.   Musculoskeletal: Positive for back pain and gait problem.       Muscle weakness  All other systems reviewed and are negative.  Filed Vitals:   03/07/15 1035  BP: 149/106  Pulse: 71  Temp: 98.1 F (36.7 C)  TempSrc: Oral  Weight: 221 lb (100.245 kg)       Objective:   Physical Exam  Constitutional: He is oriented to person, place, and time. He appears well-developed. No distress.  Cardiovascular: Normal rate, regular rhythm and normal heart sounds.   No murmur heard. Pulmonary/Chest: Effort normal and breath sounds normal. No respiratory distress.  Mild expiratory wheezing globally  Abdominal: Soft. Bowel sounds are normal. He exhibits no distension. There is no tenderness.  Musculoskeletal:       Lumbar back: He exhibits decreased range of motion and tenderness.  Neurological: He is alert and oriented to person, place, and time. No cranial nerve deficit or sensory deficit.  Motor of his upper and lower limb about 3-4/5 B/L  Nursing note and vitals reviewed.      Assessment:     Chronic back pain Muslce weakness Multiple fall at home     Plan:     Check problem list.

## 2015-03-07 NOTE — Assessment & Plan Note (Signed)
Fall precaution discussed with him. I also gave him fall prevention handout. I worry he lives alone and I recommended HHA. I also recommended PT assessment for muscle strengthening exercise and wheel chair assessment. Patient then said he does not need a wheel chair anymore or any services, all he wants is a copy of his xray. I doubt he will allow scheduling of his appointment and again, I am uncertain that he is able to make good clinical health decision for himself based on my conversation with him. I will defer HHA referral to his PCP. Patient advised to see his PCP in 1-2 wks or sooner if he has any concern. He agreed with plan.

## 2015-03-25 ENCOUNTER — Telehealth: Payer: Self-pay | Admitting: Family Medicine

## 2015-03-25 ENCOUNTER — Other Ambulatory Visit: Payer: Self-pay | Admitting: *Deleted

## 2015-03-25 DIAGNOSIS — F411 Generalized anxiety disorder: Secondary | ICD-10-CM

## 2015-03-25 NOTE — Telephone Encounter (Signed)
Has appt on 2/20. His alprazolam runs out on the 11th.  He only has one left because he has taken 3 a day sometimes because he couldn't relax.  Is there anyway he could get enough to last him until his appt on 04/07/15?

## 2015-03-26 MED ORDER — ALPRAZOLAM 1 MG PO TABS: 1.0000 mg | ORAL_TABLET | Freq: Two times a day (BID) | ORAL | Status: DC | PRN

## 2015-03-26 NOTE — Telephone Encounter (Signed)
Patient is aware of this and will come pick up his script. Alynah Schone,CMA

## 2015-03-27 ENCOUNTER — Other Ambulatory Visit: Payer: Self-pay | Admitting: *Deleted

## 2015-03-27 DIAGNOSIS — F411 Generalized anxiety disorder: Secondary | ICD-10-CM

## 2015-04-03 DIAGNOSIS — N139 Obstructive and reflux uropathy, unspecified: Secondary | ICD-10-CM | POA: Diagnosis not present

## 2015-04-07 ENCOUNTER — Encounter: Payer: Self-pay | Admitting: Family Medicine

## 2015-04-07 ENCOUNTER — Ambulatory Visit (INDEPENDENT_AMBULATORY_CARE_PROVIDER_SITE_OTHER): Payer: Medicare Other | Admitting: Family Medicine

## 2015-04-07 VITALS — BP 142/100 | HR 91 | Temp 98.4°F | Ht 64.0 in | Wt 219.7 lb

## 2015-04-07 DIAGNOSIS — F411 Generalized anxiety disorder: Secondary | ICD-10-CM

## 2015-04-07 MED ORDER — ALPRAZOLAM 1 MG PO TABS: 1.0000 mg | ORAL_TABLET | Freq: Three times a day (TID) | ORAL | Status: DC | PRN

## 2015-04-07 NOTE — Patient Instructions (Addendum)
Thank you for coming in today!  - I think you need to take more alprazolam than you are currently being provided. I have increased your frequency to three times per day (from 2 times per day). You should get by with 2 a day, but can take 3 per day if absolutely necessary.  - I will see you in 1 month.   Our clinic's number is [phone]. Feel free to call any time with questions or concerns. We will answer any questions after hours with our 24-hour emergency line at that number as well.   - Dr. Bonner Puna

## 2015-04-07 NOTE — Progress Notes (Signed)
Subjective: Patrick Holt is a 65 y.o. male with a history of schizophrenia and anxiety presenting for anxiety follow up.   It is difficult for him to remain focused on questions, but he does report increased general anxiety. Today he only wants to talk about a bill from this office many months ago for $10 which he disagrees with. He is in contact with our Chief Strategy Officer and [facility] in an effort to resolve the dispute. He has taken xanax up to three times per day at times to calm himself with good effect. Still refuses to seek counseling therapy.   - ROS: He denies ideas of persecution, SI, HI.  - Former long-term smoker  Objective: BP 142/100 mmHg  Pulse 91  Temp(Src) 98.4 F (36.9 C) (Oral)  Ht 5\' 4"  (1.626 m)  Wt 219 lb 11.2 oz (99.655 kg)  BMI 37.69 kg/m2 Gen: 65 y.o. male in no distress Neuro: No focal deficits. Walks slowly with cane without evidence of imbalance. Speech exhibits no dysphasia.  Psych: Neatly groomed and appropriately dressed. Maintains good eye contact. He is poorly cooperative and inattentive, but redirectable. Speech is normal volume and rate. Mood, though not explicitly asked, is demonstrably nervous with congruent affect. Thought process is fixated but logical and goal directed. No suicidal or homicidal ideation. Does not appear to be responding to any internal stimuli. Has difficulty maintaining train of thought.  Assessment/Plan: BRIJESH VITE is a 64 y.o. male here for worsening anxiety.  Anxiety state Xanax frequency/dosing not quite dialed in. Will increase to TID dosing and follow up in 1 month. I have not seen significant improvement in his quality of life since starting this medicine, so will continue until likely therapeutic dose is achieved without side effects. If side effects develop or if significant dose fails to produce improvements, will certainly discontinue this.

## 2015-04-09 NOTE — Assessment & Plan Note (Signed)
Xanax frequency/dosing not quite dialed in. Will increase to TID dosing and follow up in 1 month. I have not seen significant improvement in his quality of life since starting this medicine, so will continue until likely therapeutic dose is achieved without side effects. If side effects develop or if significant dose fails to produce improvements, will certainly discontinue this.

## 2015-04-13 IMAGING — CT CT HEAD W/O CM
4 of 6 series · 16 of 47 positions shown, 18 images · non-contrast
Comparison: {}07/24/12

CLINICAL DATA: CT HEAD WITHOUT CONTRAST
CT CERVICAL SPINE WITHOUT CONTRAST
TECHNIQUE: Multidetector CT imaging of the head and cervical spine
was performed following the standard protocol without intravenous
contrast.  Multiplanar CT image reconstructions of the cervical
spine were also generated.

[Series 2: head w/o · axial · non-contrast · 0.49mm/px · z∈[+146,+201]mm · 2 of 34 slices shown]
[im 12/34  brain]
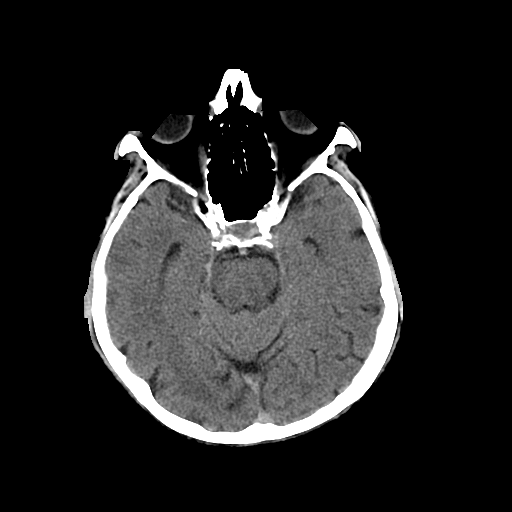
[im 23/34  brain]
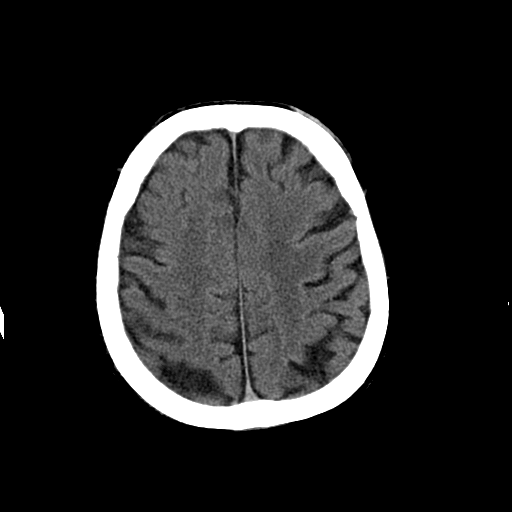

[Series 5: soft tissue · axial · 0.39mm/px · z∈[-45,+109]mm · 8 of 101 slices shown, 10 images]
[im 12/101  brain]
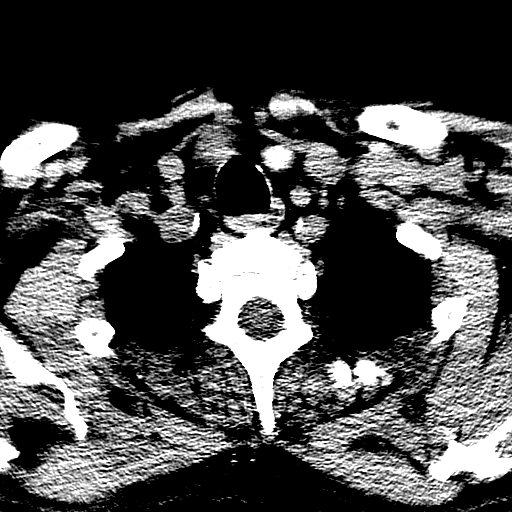
[im 12/101  bone]
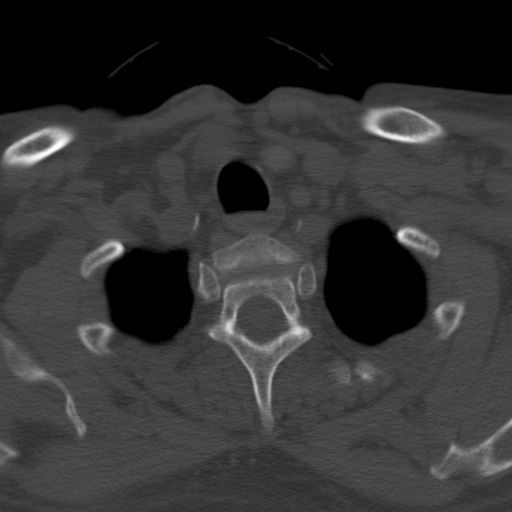
[im 23/101  brain]
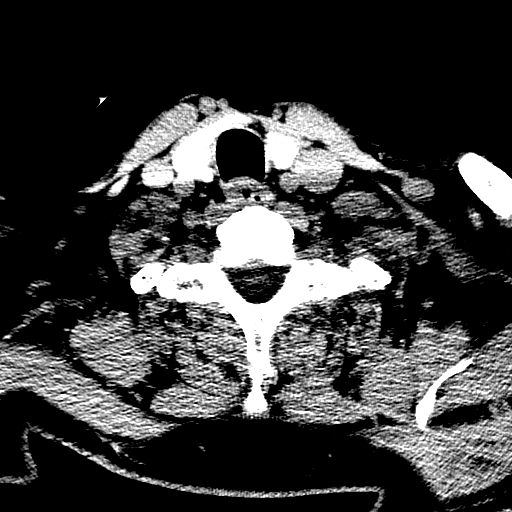
[im 34/101  brain]
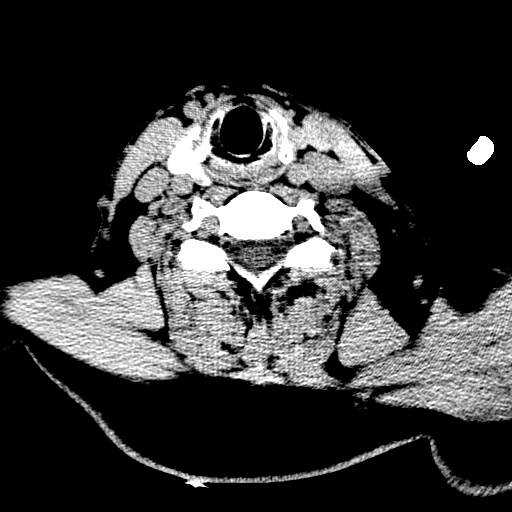
[im 45/101  brain]
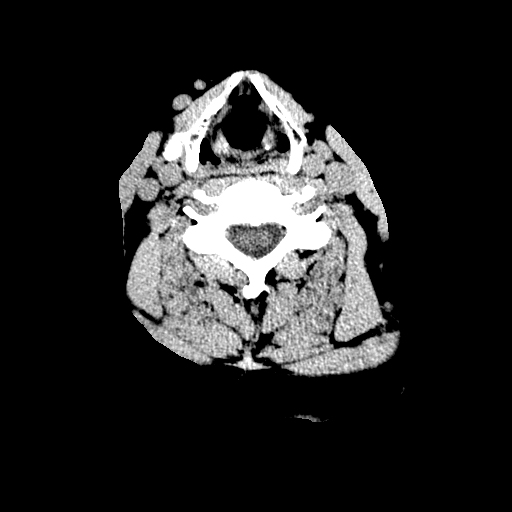
[im 56/101  brain]
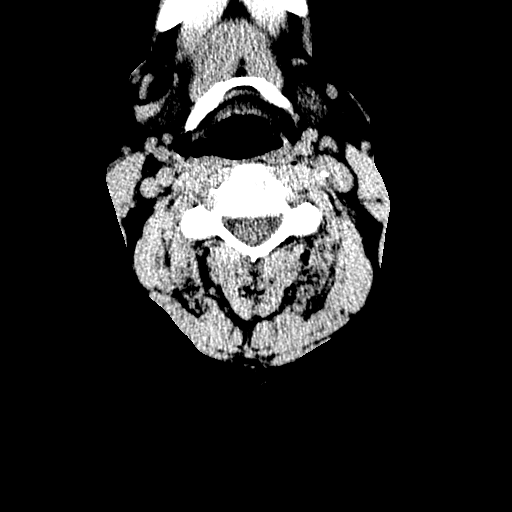
[im 56/101  bone]
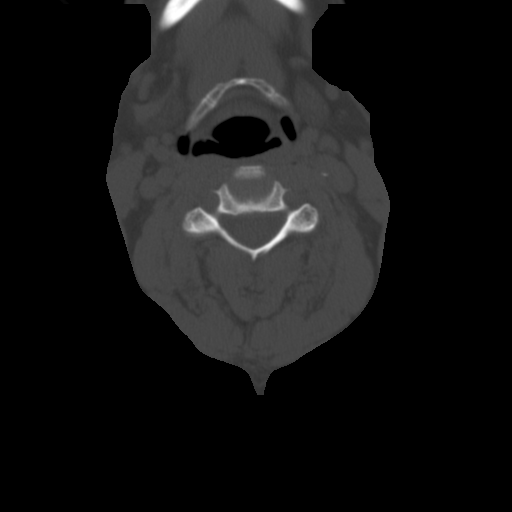
[im 67/101  brain]
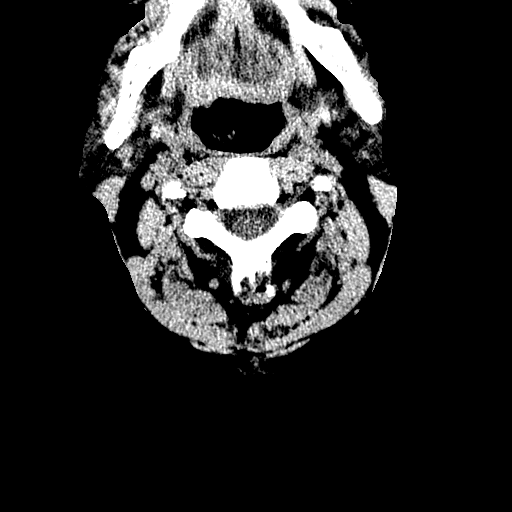
[im 78/101  brain]
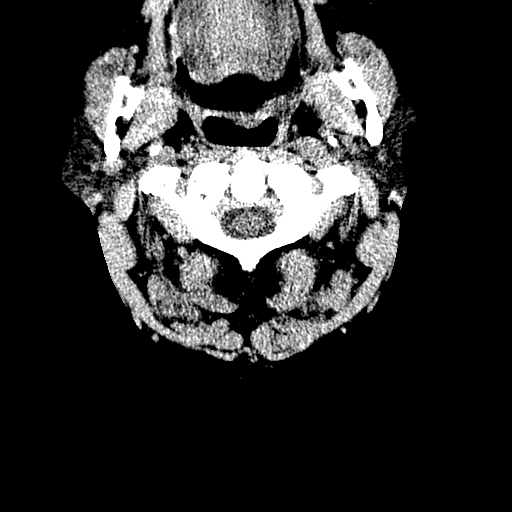
[im 89/101  brain]
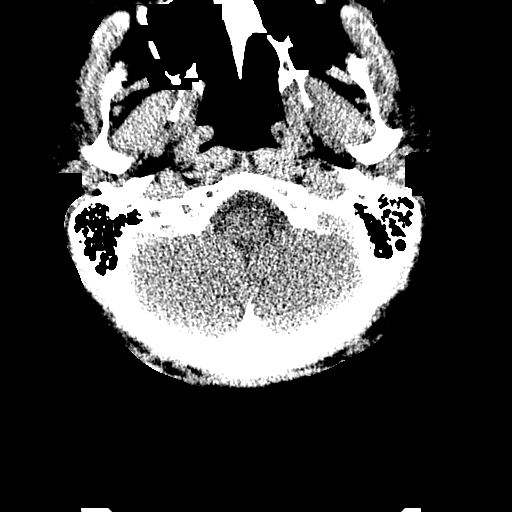

[coronal · coronal · 0.39mm/px · 3 of 52 slices shown]
[im 18/52  brain]
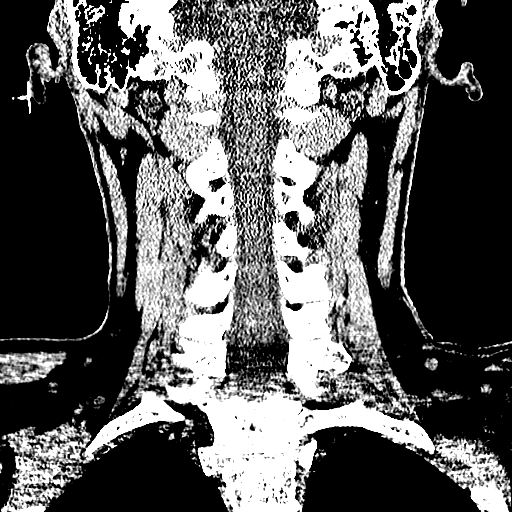
[im 23/52  brain]
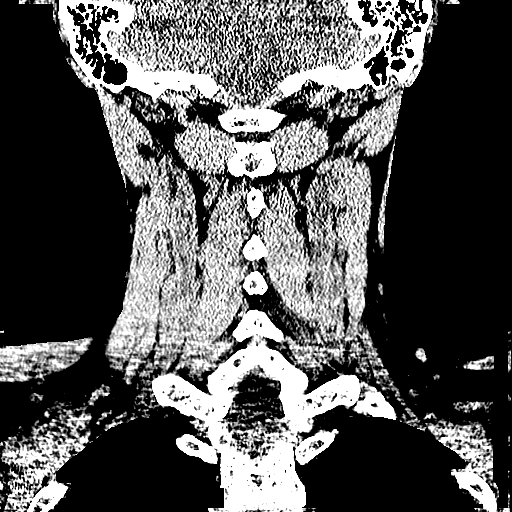
[im 29/52  brain]
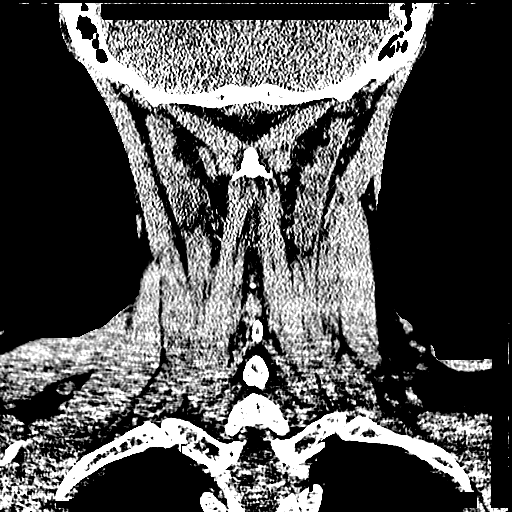

[sag · sagittal · 0.39mm/px · 3 of 51 slices shown]
[im 17/51  brain]
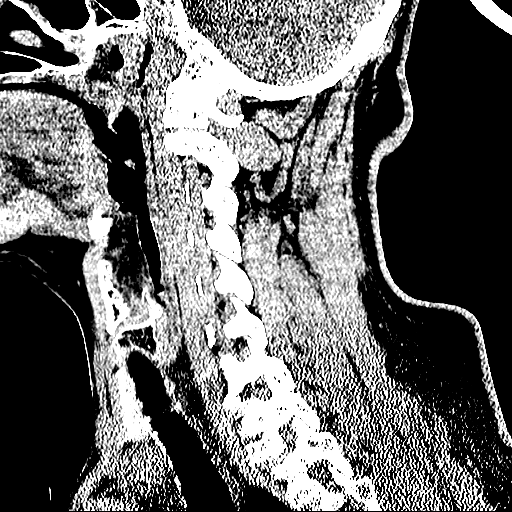
[im 26/51  brain]
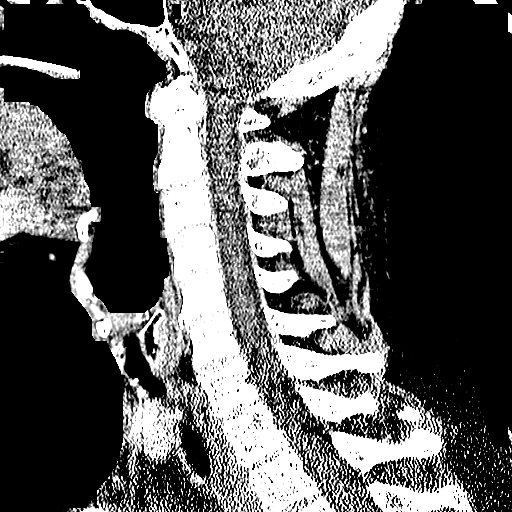
[im 34/51  brain]
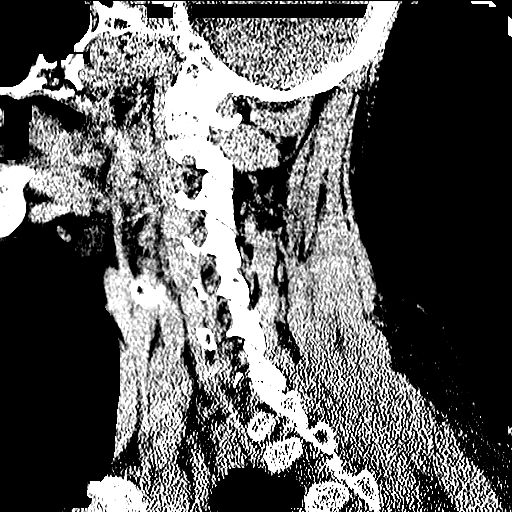

[16 of 47 positions shown; findings below may reference images not displayed]

FINDINGS: [No displaced skull fractures.  There is patchy bilateral
white matter low attenuation with no evidence of vascular territory
infarct.  No hemorrhage extra-axial fluid.  Stable diffuse atrophy.
No hydrocephalus.]
IMPRESSION: [Unchanged appearance from prior study.  Chronic involutional
change with no acute traumatic injury.]

CT CERVICAL SPINE
FINDINGS: Incidental note is made of debris within the pharynx,
likely retained secretions.  There is no evidence of cervical spine
acute fracture.  There is a chronic corticated fracture of the T1
spinous process.
IMPRESSION: No acute fracture.  Filling defects/debris in the hypopharynx
likely related to mucous and retained secretions.  Consider
reevaluation to exclude polypoid lesions when the patient's
physical condition allows.

## 2015-04-13 IMAGING — CR DG CHEST 2V
2 series · 2 of 2 positions shown · non-contrast
Comparison: 07/24/2012

CLINICAL DATA: Altered mental status.

EXAM:
CHEST  2 VIEW

[x chest ap]
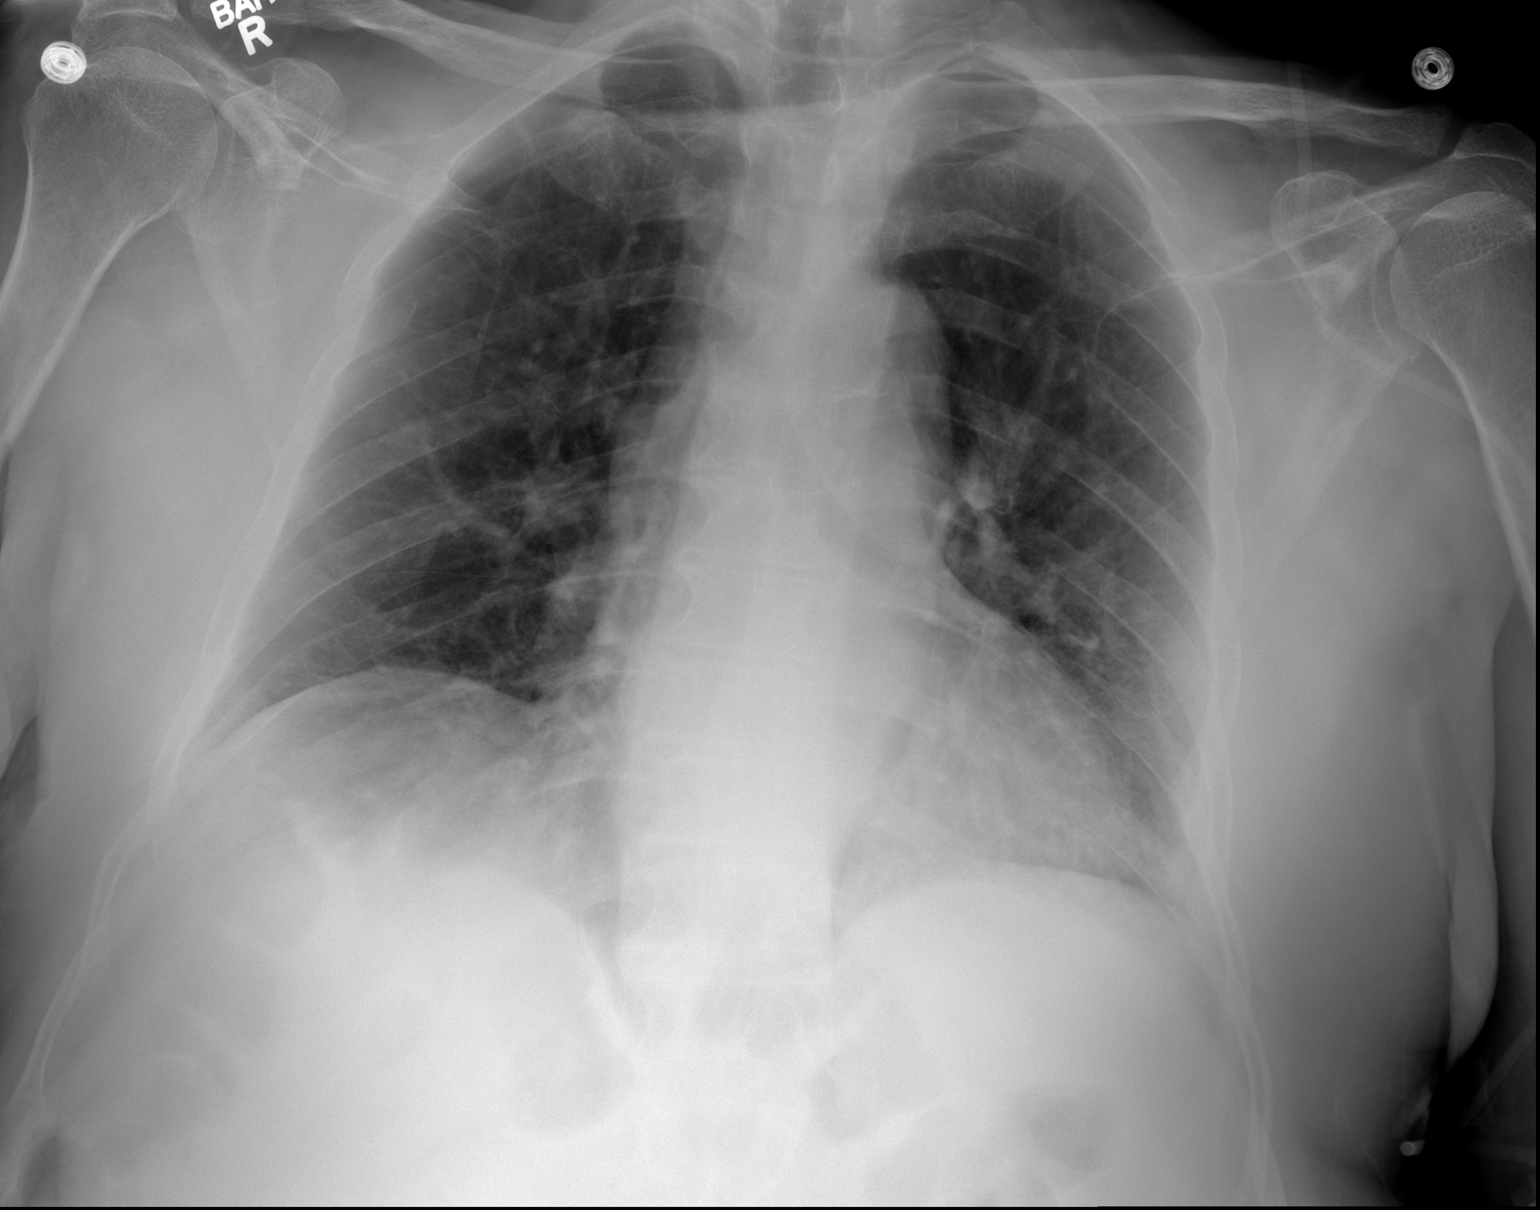

[w chest lat]
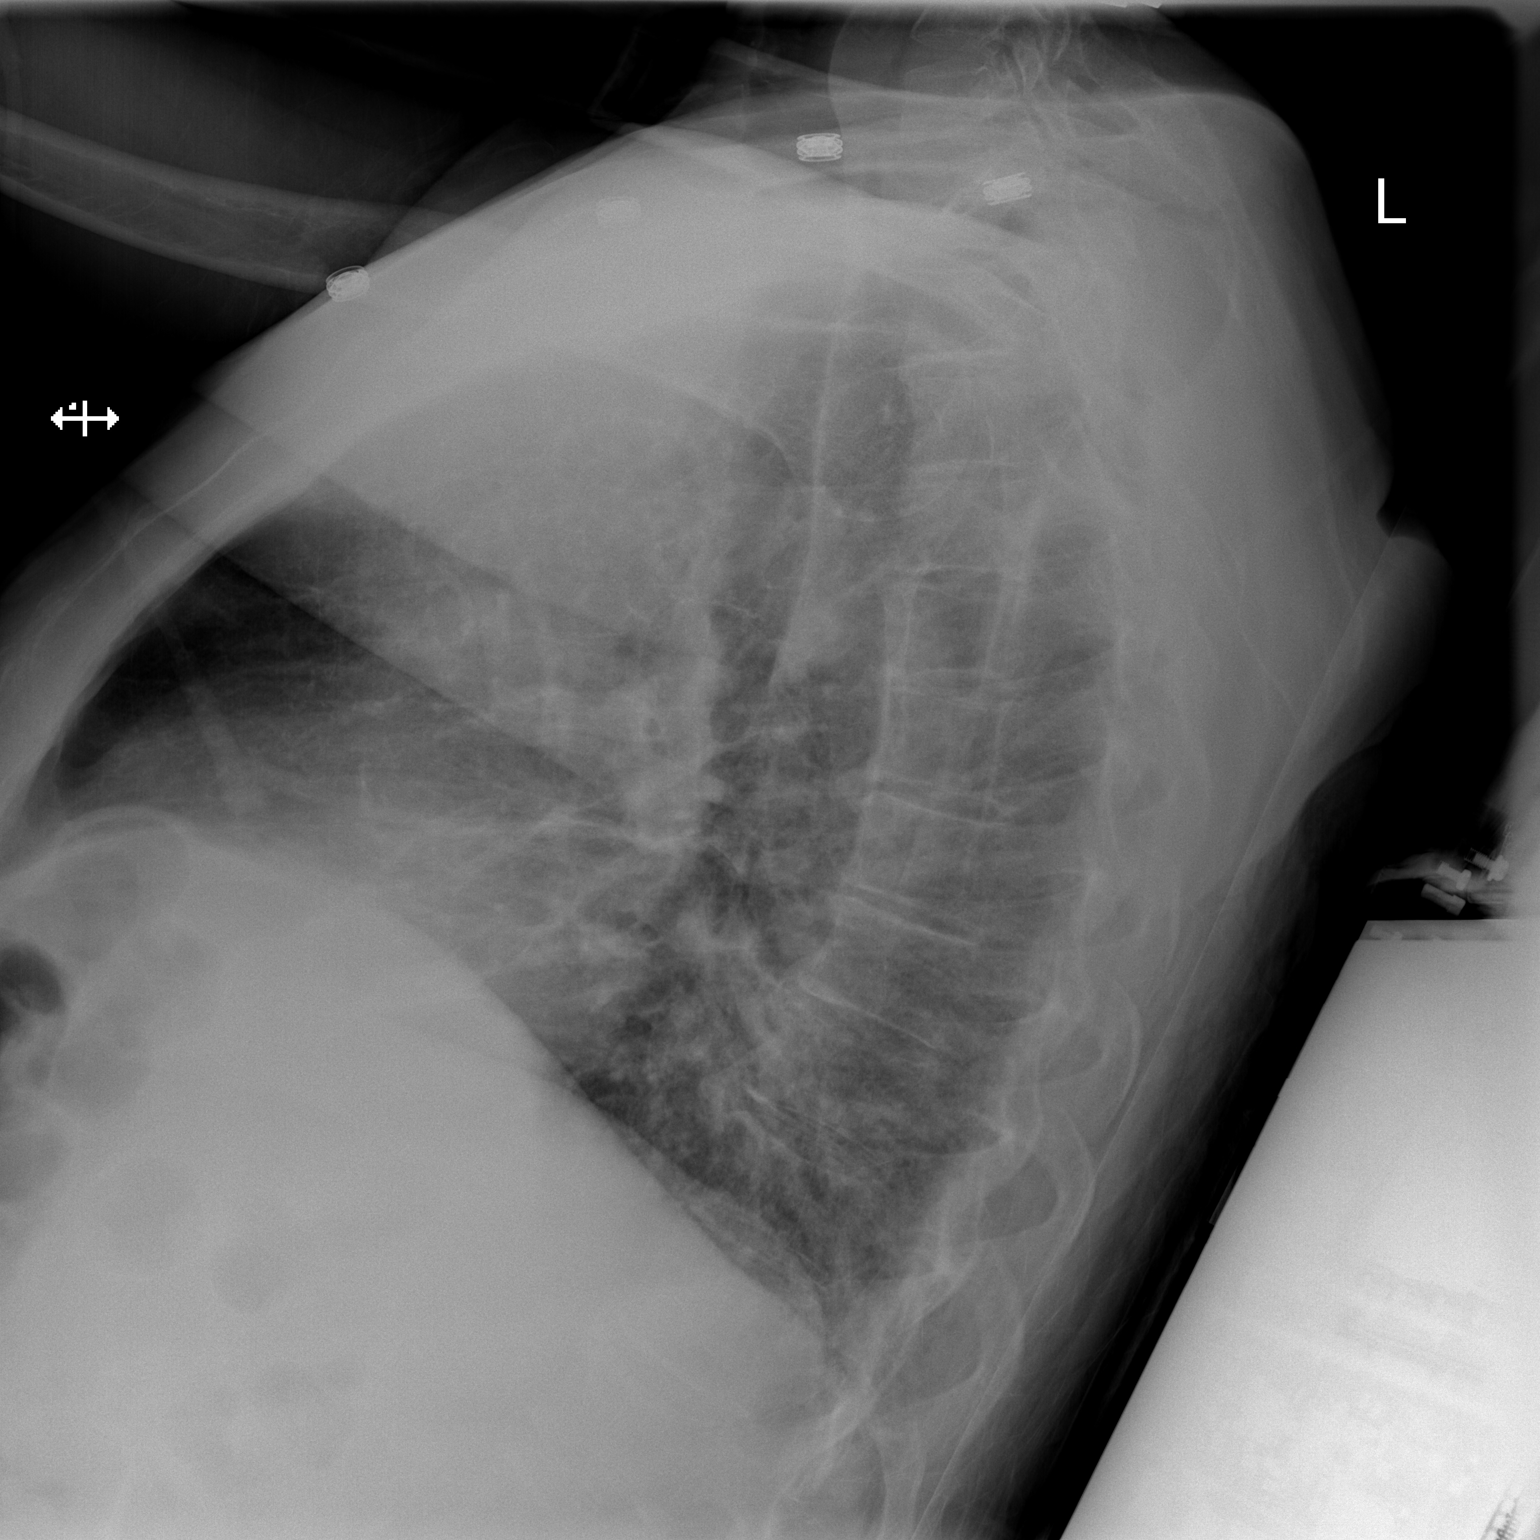

[2 of 2 positions shown; findings below may reference images not displayed]

FINDINGS: Heart is normal size. Minimal bibasilar atelectasis. No effusions or
acute bony abnormality.
IMPRESSION: Bibasilar atelectasis.

## 2015-04-14 ENCOUNTER — Telehealth: Payer: Self-pay | Admitting: Family Medicine

## 2015-04-14 NOTE — Telephone Encounter (Signed)
Spoke to pt. Told him to get the book at [place] or he can order it online. Ottis Stain, CMA

## 2015-04-14 NOTE — Telephone Encounter (Signed)
Wants to know where he can get a book [employer]- a book about writing will.

## 2015-04-15 IMAGING — CR DG CHEST 2V
2 series · 2 of 2 positions shown · non-contrast
Comparison: Chest x-ray dated 10/16/2012

CLINICAL DATA: Elevated white blood count.

CHEST - 2 VIEW

[view not recorded (1 of 2)]
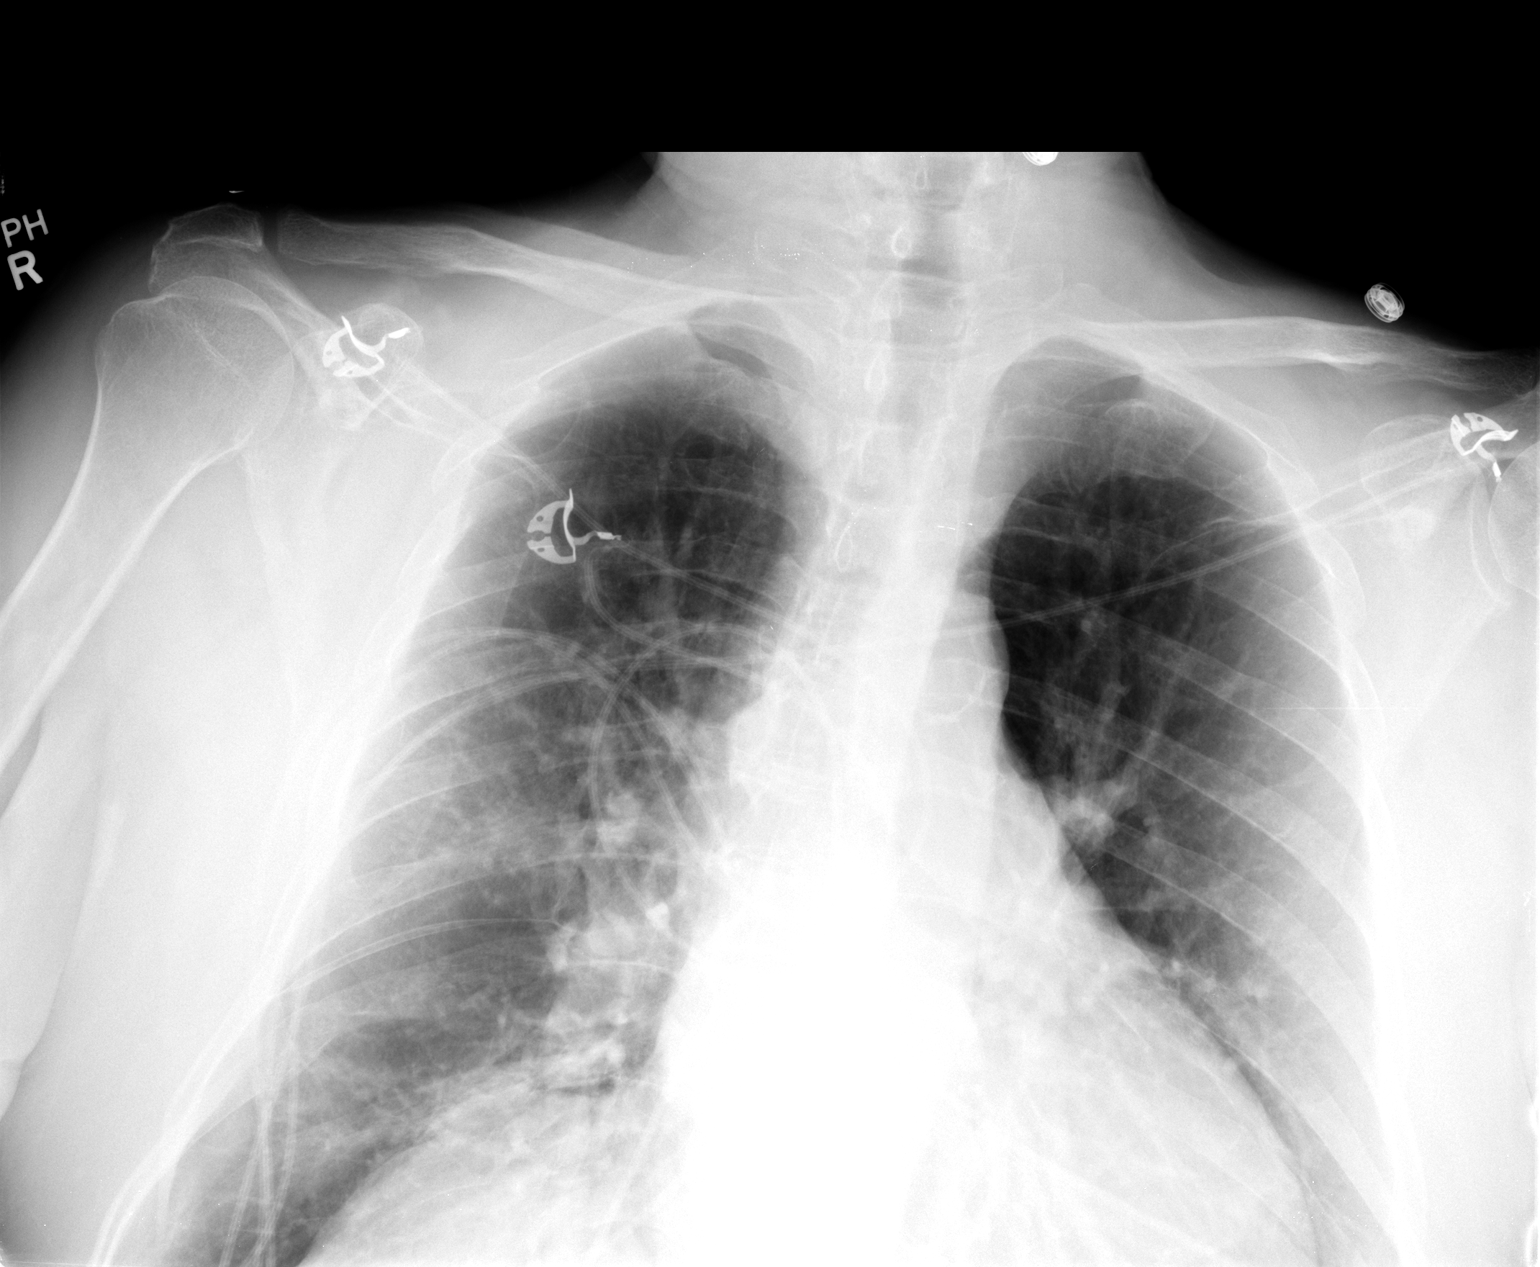

[view not recorded (2 of 2)]
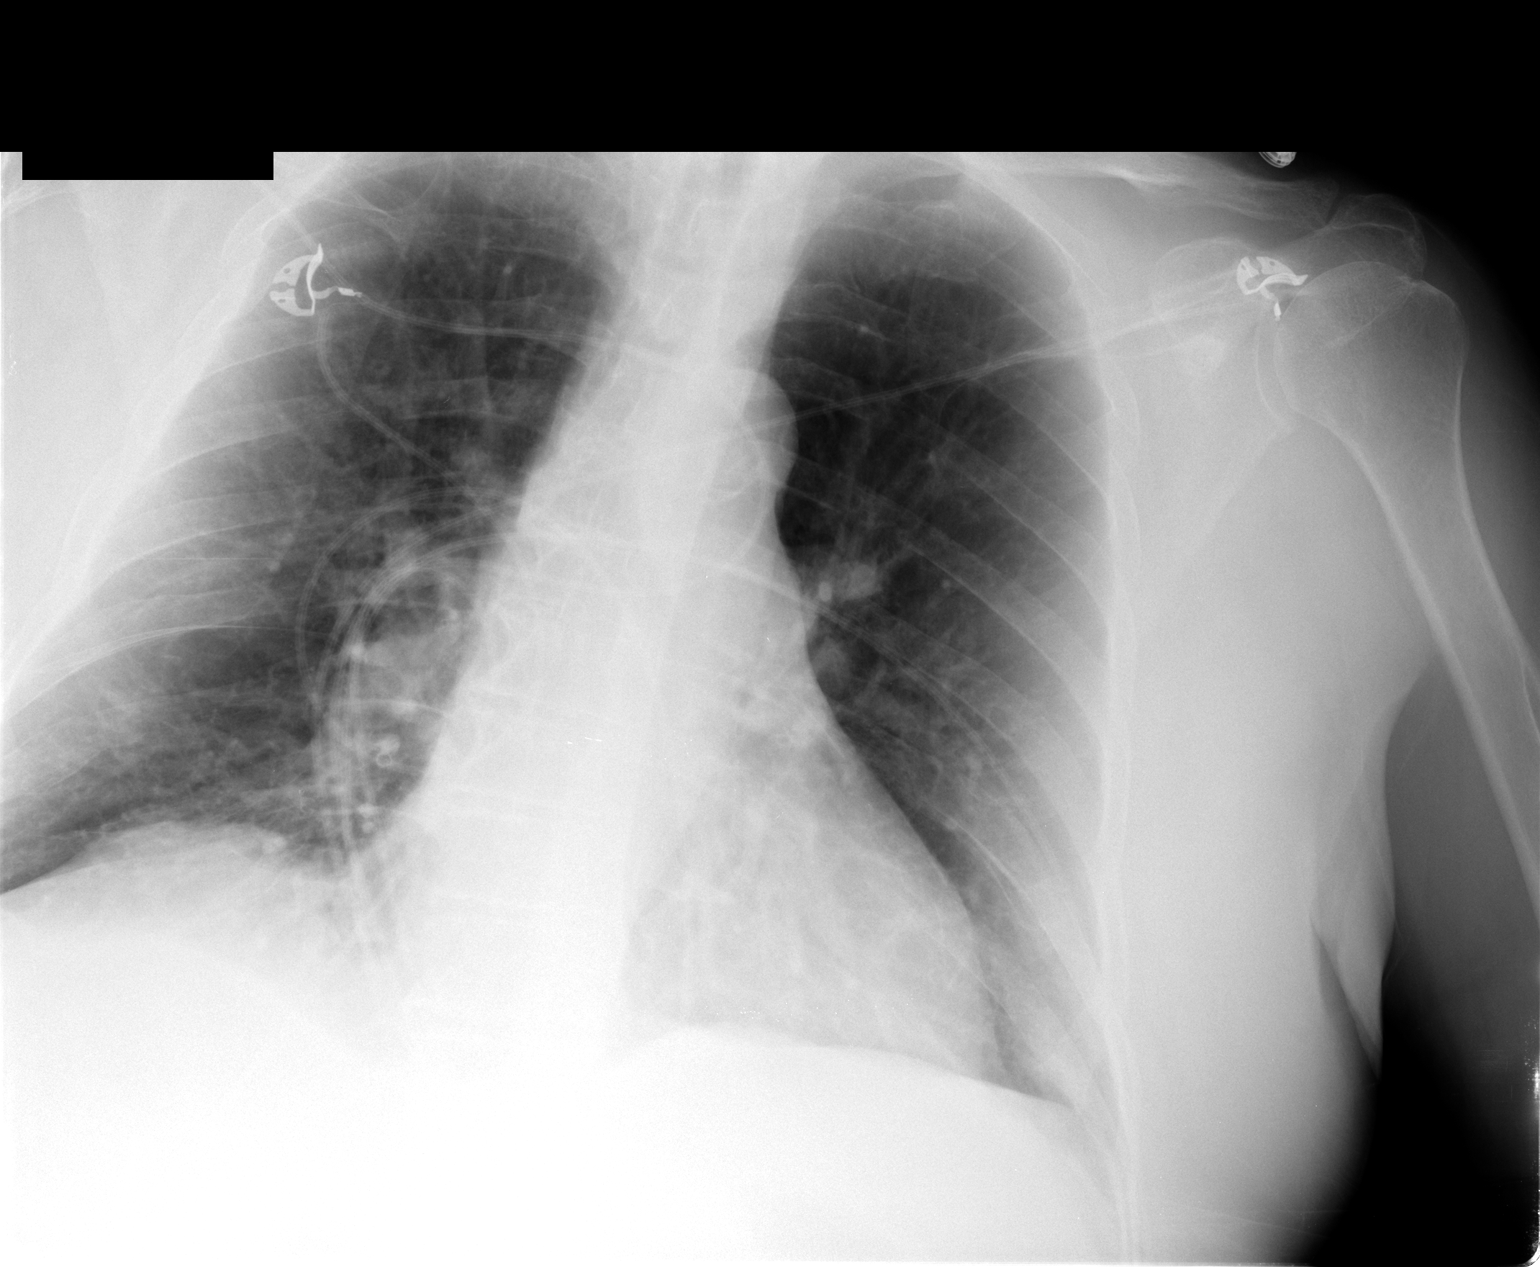

[2 of 2 positions shown; findings below may reference images not displayed]

FINDINGS: Heart size and pulmonary vascularity are normal.  The
lungs are hyperinflated with flattening of the diaphragm.  Slight
blunting of the posterior costophrenic angles could be due to tiny
effusions.  Minimal linear atelectasis at the right lung base.  No
consolidative infiltrates.  No acute osseous abnormalities.
IMPRESSION: 1.  Minimal atelectasis at the right lung base.
2.  Possible tiny effusions.
3.  Hyperinflated lungs suggesting emphysema.

## 2015-04-18 ENCOUNTER — Other Ambulatory Visit: Payer: Self-pay | Admitting: Family Medicine

## 2015-04-21 NOTE — Telephone Encounter (Signed)
"  he needs to call in the damn prescription because I am tired of changing pants!"

## 2015-04-23 DIAGNOSIS — G894 Chronic pain syndrome: Secondary | ICD-10-CM | POA: Diagnosis not present

## 2015-04-23 DIAGNOSIS — M4316 Spondylolisthesis, lumbar region: Secondary | ICD-10-CM | POA: Diagnosis not present

## 2015-04-28 ENCOUNTER — Encounter: Payer: Self-pay | Admitting: Family Medicine

## 2015-04-28 ENCOUNTER — Ambulatory Visit (INDEPENDENT_AMBULATORY_CARE_PROVIDER_SITE_OTHER): Payer: Medicare Other | Admitting: Family Medicine

## 2015-04-28 VITALS — BP 162/95 | HR 63 | Temp 98.5°F | Ht 64.0 in

## 2015-04-28 DIAGNOSIS — Z7409 Other reduced mobility: Secondary | ICD-10-CM | POA: Diagnosis not present

## 2015-04-28 DIAGNOSIS — M3390 Dermatopolymyositis, unspecified, organ involvement unspecified: Secondary | ICD-10-CM | POA: Diagnosis not present

## 2015-04-28 NOTE — Assessment & Plan Note (Signed)
After evaluation, I believe Mr. Sum needs more significant assistance with mobility. He has had a motorized wheelchair in the past and I believe this may be best for him now, as his upper body strength is too impaired for reliable use of manual wheelchair. Will Rx this, fax to [facility] at [phone].

## 2015-04-28 NOTE — Progress Notes (Addendum)
Subjective: Patrick Holt is a 65 y.o. male presenting for mobility evaluation.   He reports ongoing difficulty with ambulation related to severe back pain with sciatica. He was referred to surgery, though they recommended physical therapy, which he has tried multiple times without improvement. He began having difficulty walking many years ago, for which he was in a wheelchair, though found that it was difficult to maneuver in his home. He was using a power wheelchair, but did not get a new one when it stopped working after 5 years. He has been getting around with a cane and reports frequent falls due to weakness of legs, slipping in shower, and imbalance. He finds it difficult to walk from the bus stop to the clinic (~ 150 feet) and has severe pain. The recent cause of his desire for increased ambulation assistance is that he is unable to open and proceed through heavy doors at his assisted living facility. He has found he is unable to propel himself with manual wheelchair.   - ROS: As above. - Non-smoker - PMH: Dermatomyositis, dismissed by multiple rheumatology practices for chronic no-shows. This has flared at times but worsened overall in the past several years. Also has avdanced lumbosacral spondylosis.   Objective: BP 162/95 mmHg  Pulse 63  Temp(Src) 98.5 F (36.9 C) (Oral)  Ht 5\' 4"  (1.626 m)  Wt  not obtained at this office visit, Last weighed 04/07/2015: 219.7lbs. (BMI 37.69) Gen: Well groomed 65 y.o. male in no distress CV: Regular rate, no murmur; radial, DP and PT pulses 2+ bilaterally; trace LE edema, no JVD, cap refill < 2 sec. Pulm: Non-labored breathing ambient air; CTAB, no wheezes or crackles Back: Normal skin. Spine with normal alignment and no palpable step-offs. No tenderness to vertebral process palpation. Paraspinous muscles are tender without spasm. Range of motion is full at neck and severely limited at lumbar sacral regions. Straight leg raise is positive/limited  by diminished ROM. MSK/Neuro: Alert and oriented x4, CN II-XII without deficits, no focal deficits in sensation or motor function, but diffusely decreased strength proximal > distal muscle groups. Grasp 3/5, elbow flex/ex 3/5, shoulder flexion and abduction 3/5. Able to stand from chair in ~7 second with assistance from cane and table and considerable effort. Hip flexion 3/5, knee extension 3/5, foot dorsiflexion/plantarflexion 4/5, somewhat limited by pain. Patellar DTRs 2+ bilaterally. No dysmetria or dysdiadochokinesia. No aphasia. Romberg negative. No pronator drift. Gait: Very slow with imbalance, jerky movements and grimace of pain with steps. Uses a cane and occasionally holds onto the wall.   Grade 1 L5-S1 anterolisthesis and advanced spondylosis at the lumbosacral junction and L2-3 level noted on XR 01/28/2015.  Assessment/Plan: Patrick Holt is a 65 y.o. male here for mobility evaluation.  Impaired mobility After evaluation, I believe Patrick Holt needs more significant assistance with mobility. He has had a motorized wheelchair in the past and I believe this may be best for him now, as his upper body strength is too impaired for reliable use of manual wheelchair. Will Rx this, fax to [facility] at [phone].    ADDENDUM 05/09/2015: On exam, Patrick Holt exhibits significant weakness in the upper body affecting predominantly axial musculature and also extremities. His strength of resistance with shoulder abduction and flexion is symmetric and only sufficient to resist gravity, not any physical resistance. Thus, he would be unable to propel a manual wheelchair.   I believe an assistive device such as a powered wheelchair or scooter is required to mobilize this  patient to the restroom, kitchen, etc. in order to perform ADLs such as toileting or meal preparation. He is able to transfer safely to/from a scooter, operate a swivel steering mechanism, and maintain posture/position on a  scooter. He is physically and mentally capable of safely operating a [place] or scooter.   Patrick Abruzzese B. Bonner Puna, MD, PGY-3 05/09/2015 4:12 PM  ADDENDUM 05/13/2015:  Please note addition of weight data. As above, weight was not obtained at this office visit, Last weighed 04/07/2015: 219.7lbs. (BMI 37.69)  Patrick Repetto B. Bonner Puna, MD, PGY-3 05/13/2015 1:14 PM

## 2015-04-28 NOTE — Patient Instructions (Signed)
Thank you for coming in today!  We are getting documentation together to prescribe the power wheelchair. We will send this by fax to [facility] in the next couple days.   Our clinic's number is [phone]. Feel free to call any time with questions or concerns. We will answer any questions after hours with our 24-hour emergency line at that number as well.   - Dr. Bonner Puna

## 2015-05-01 ENCOUNTER — Telehealth: Payer: Self-pay | Admitting: Cardiology

## 2015-05-01 NOTE — Telephone Encounter (Signed)
New Message  Pt called states that on 2/12 hospital. Per pt he states that his PCP never reads the reports that are sent. Had to get the report from Social services. Also retrieved a "Film"  from [facility]. Last report that was sent talked about alot. Main concern is he will need heart surgery. Per Report that the pt has he has heart disease and that his valve is not closing. blockage in arteries. Per pt he has had a  Stress test completed and the  stress test came back ok. However,  the hospital says he will need surgery. He states that he cant change PCP. even though he doesnt like him.  Will he need to have surgery? Please call back to discuss.    Pt states that he has been in AFib many many times since September. Pt declines PA or NP.

## 2015-05-02 ENCOUNTER — Telehealth: Payer: Self-pay | Admitting: Family Medicine

## 2015-05-02 NOTE — Telephone Encounter (Signed)
Goff called and said that for her to get the wheelchair approved they need the office notes from his wheelchair evaluation. They also need a faculty member to sign off on this so that they can bill insurance. Please fax this to [phone] attention [place]. jw

## 2015-05-02 NOTE — Telephone Encounter (Signed)
He has mobility issues and was referred for back surgery (spinal fusion) but surgeon said he does not need it but the paperwork that was sent to the surgeon stated he needs heart surgery and has a brain tumor.  He has fallen several times, twice in the shower.     He states he is in/out of afib"all the time".   --takes baby aspirin; was on eliquis, stopped due to GI bleed-- He is focused on copays and how much he pays at each doctors office and his new insurance.   He is in pain all the time in his back.  Pt kept retelling various forms of the above information for rest of phone call.  I did schedule an OV with Dr. Harrington Challenger for 06/05/15.

## 2015-05-03 ENCOUNTER — Telehealth: Payer: Self-pay | Admitting: Family Medicine

## 2015-05-03 NOTE — Telephone Encounter (Addendum)
Paged to [facility] Emergency Line at approx 1030 by patient Patrick Holt. He recounts a long detailed history regarding his Back Pain (Lumbar DJD w/ sciatica) and Impaired Mobility course more recently dating back to 2013 but prior notes report >10-15 years of chronic back pain. He has been seen multiple times within past 3 months at [facility], including 03/07/15 for back pain, and 04/28/15 by PCP for a mobility wheelchair evaluation. He is established at [facility], he states that he had been told a "spinal fusion" was proposed, however when he went to see Spine Surgery they told him he "did not need surgery and should go to pain clinic", however patient does not want to "get hooked on the strong pain medicine".  He expresses his primary concern today that he needs a wheelchair at home, currently lives on 3rd floor, and does not think that the previously ordered motorized wheelchair will fit (previously had motorized wheelchair >5 yr ago), he is now concerned he may need a "3 wheeled scooter". Additionally, states he has already contacted [facility] and other related company, now they have advised him that they are waiting on an prescription for the motorized wheelchair (or scooter) due to chronic weakness / dermatomyositis, he needs this rx written and signed by a faculty member (he stated Dr Andria Frames was listed on the documents), prior rx on 04/28/15 by PCP Dr Bonner Puna is not covered by insurance. He is concerned because he continues to have frequent falls while only using a cane.  Copied recent telephone note dated 05/02/15 below, as it pertains to the above conversation: "Patrick Holt called and said that for her to get the wheelchair approved they need the office notes from his wheelchair evaluation. They also need a faculty member to sign off on this so that they can bill insurance. Please fax this to [phone] attention Patrick Holt"  Forward this note to PCP and Dr Andria Frames for further  assistance in expediting this request.  Patrick Holt, [place], PGY-3

## 2015-05-05 NOTE — Telephone Encounter (Signed)
Pt calls back again.  Advised that we were going to get a PECOS certified MD to cosign the Rx and fax over notes.  He gave me the following number to contact Claris Pong it is as follows [phone]  Opt [phone] .  Her Fax  [phone].  I was able to get Dr. Mingo Amber to [facility] and fax notes to above number.  [employer] and she received notes, she is going to send for review but there are a few things that will need to be changed in the OVN, she will fax these over with additional information.  The notes will also need to be signed by Dr. Mingo Amber wether it is electronically or handwritten. Wayburn Shaler, Salome Spotted, CMA

## 2015-05-05 NOTE — Telephone Encounter (Signed)
I (Hensel) am listed on documents because residents cannot sign as DME providers.  Dr. Bonner Puna will work with patient to see what can be done.  I will continue to co sign for whatever Dr. Bonner Puna deems appropriate.

## 2015-05-06 IMAGING — CT CT HEAD W/O CM
2 series · 16 of 30 positions shown, 20 images · non-contrast
Comparison: 10/16/2012

CLINICAL DATA: Altered mental status

EXAM:
CT HEAD WITHOUT CONTRAST
TECHNIQUE: Contiguous axial images were obtained from the base of the skull
through the vertex without intravenous contrast.

[Series 2: head w/o · axial · non-contrast · 0.48mm/px · z∈[-254,-119]mm · 13 of 33 slices shown, 17 images]
[im 3/33  brain]
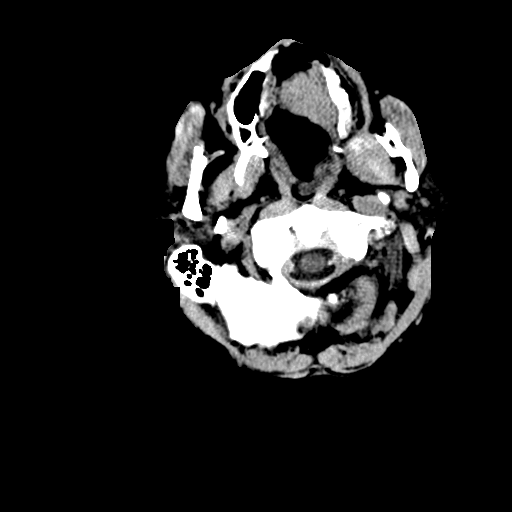
[im 3/33  bone]
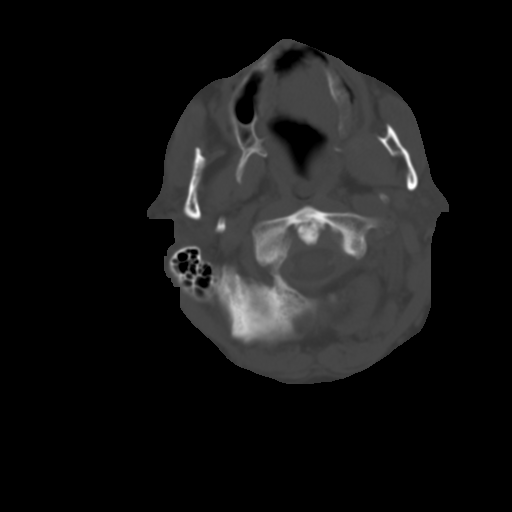
[im 5/33  brain]
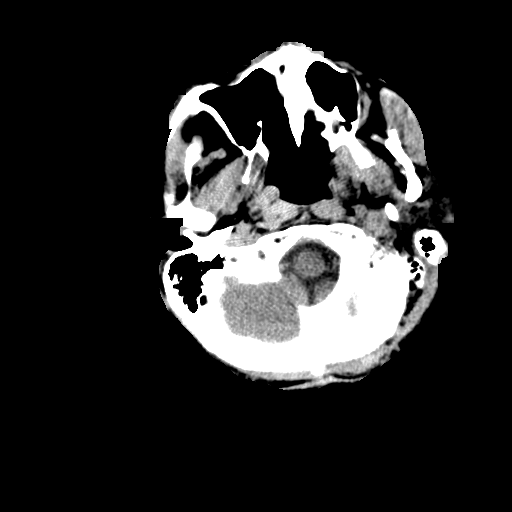
[im 7/33  brain]
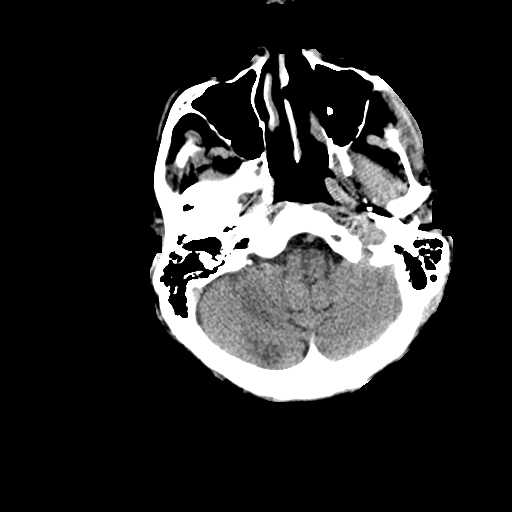
[im 10/33  brain]
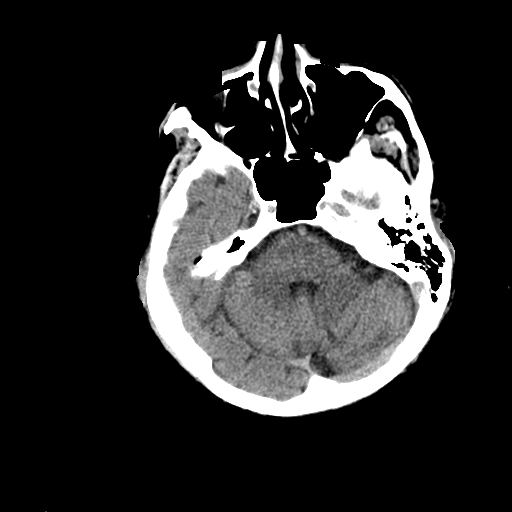
[im 12/33  brain]
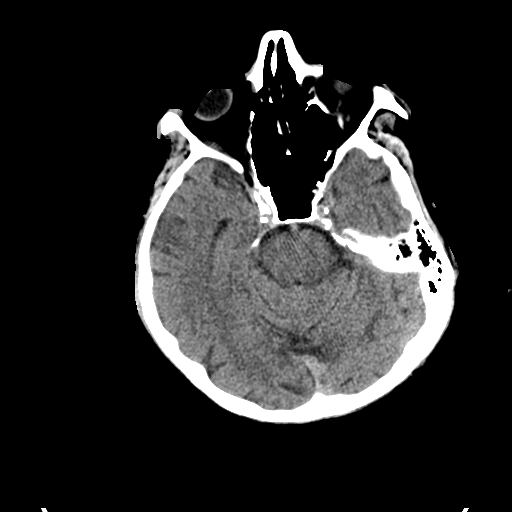
[im 12/33  bone]
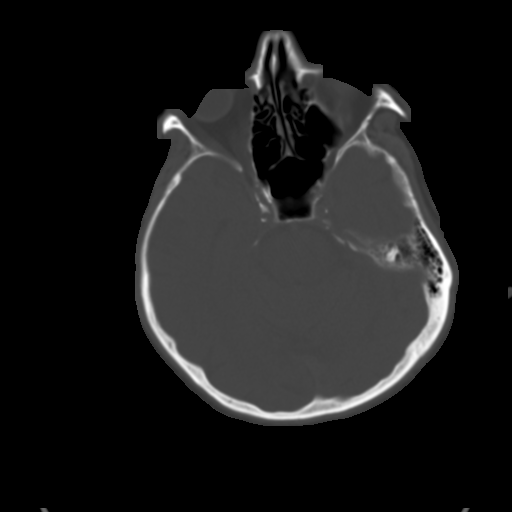
[im 14/33  brain]
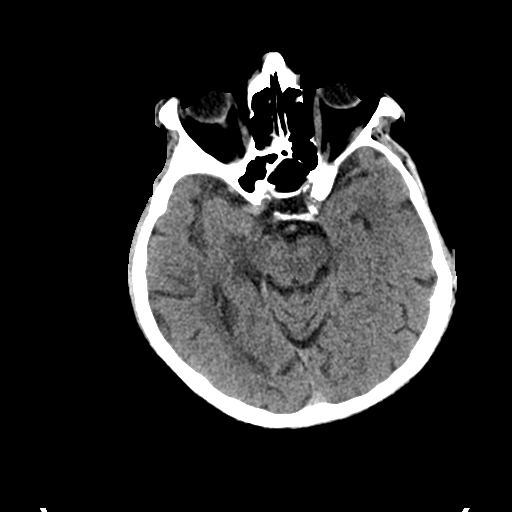
[im 17/33  brain]
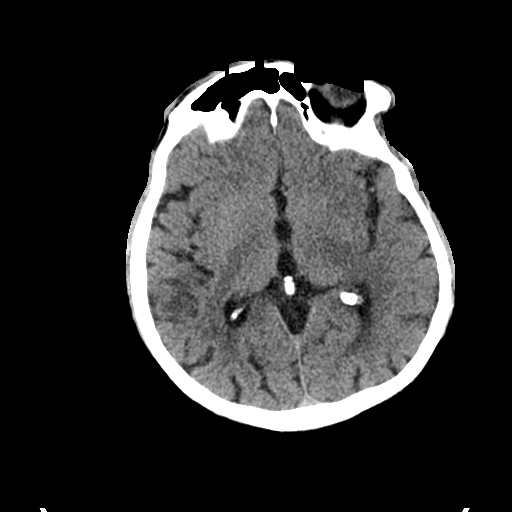
[im 19/33  brain]
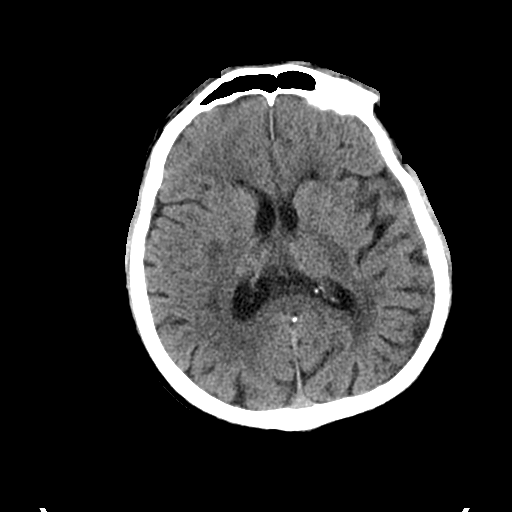
[im 21/33  brain]
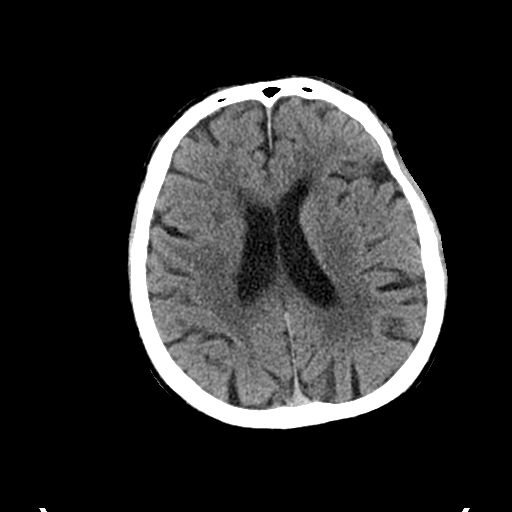
[im 21/33  bone]
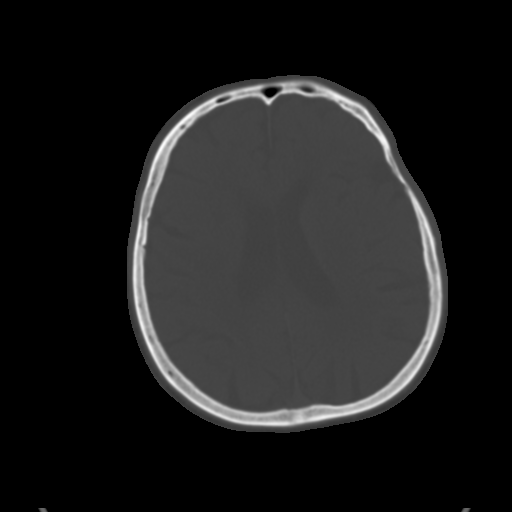
[im 23/33  brain]
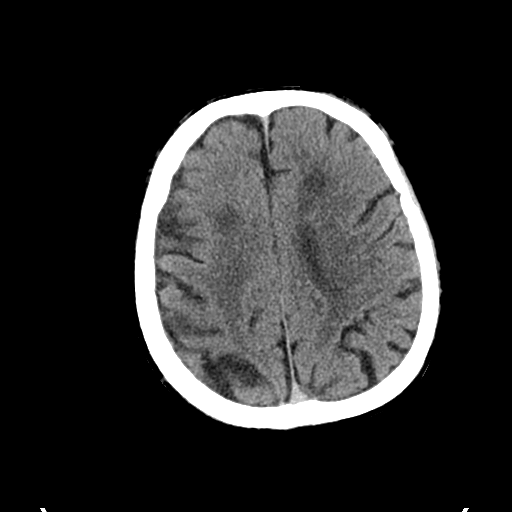
[im 26/33  brain]
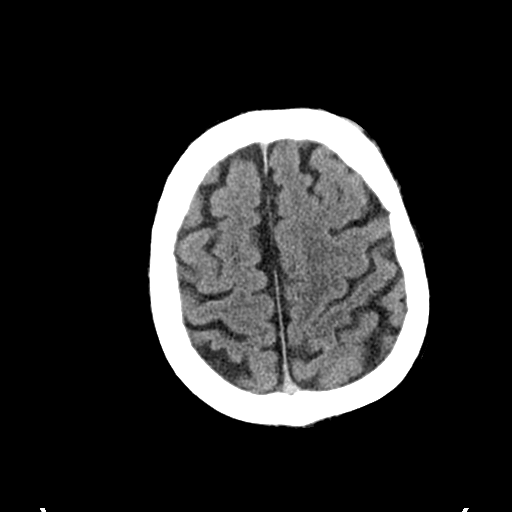
[im 28/33  brain]
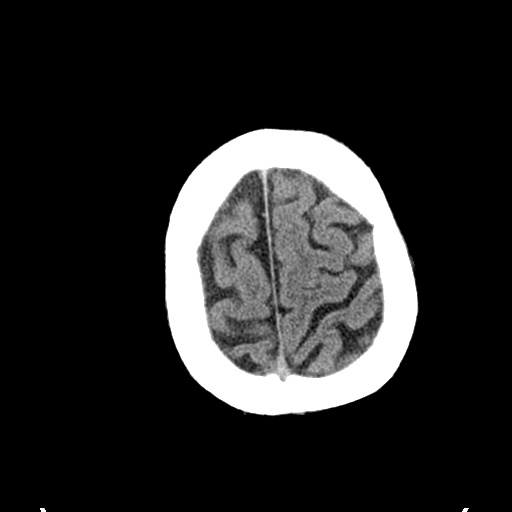
[im 30/33  brain]
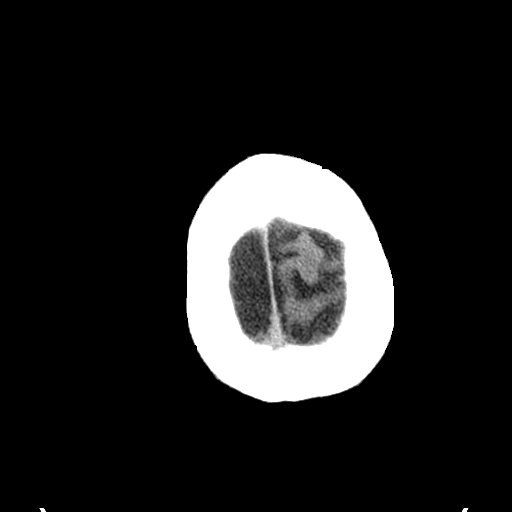
[im 30/33  bone]
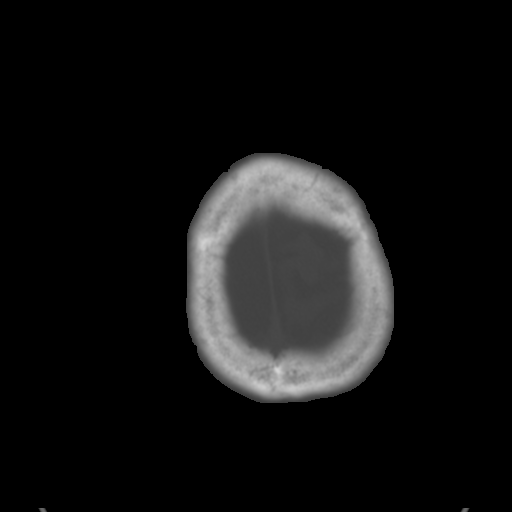

[Series 3: bone windows · axial · 0.48mm/px · z∈[-254,-209]mm · 3 of 33 slices shown]
[im 3/33  bone]
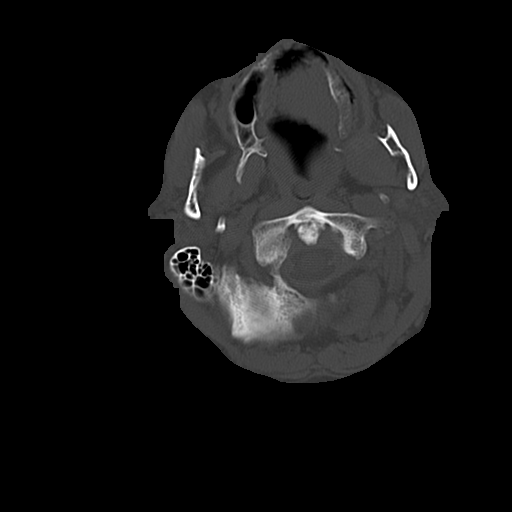
[im 7/33  bone]
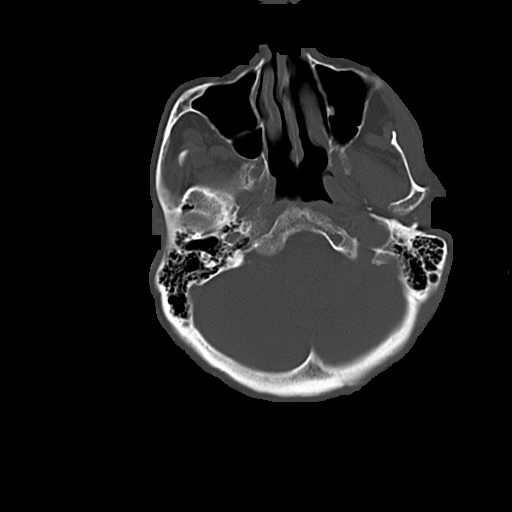
[im 12/33  bone]
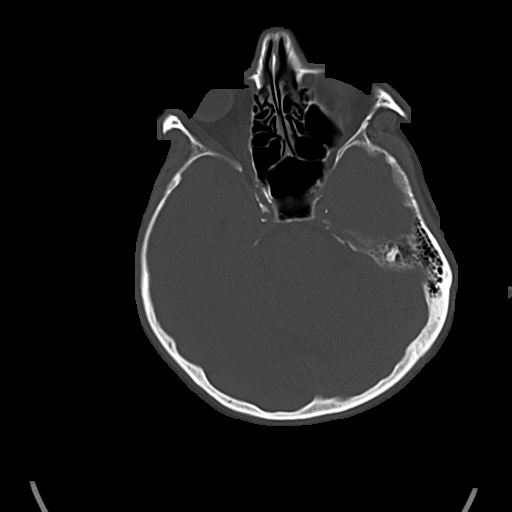

[16 of 30 positions shown; findings below may reference images not displayed]

FINDINGS: Skull:No acute osseous abnormality. No lytic or blastic lesion.

Orbits: No acute abnormality.

Brain: No evidence of acute abnormality, such as acute infarction,
hemorrhage, hydrocephalus, or mass lesion/mass effect. Unchanged
pattern of patchy bilateral cerebral white matter low density, age
advanced. There has been a remote lacunar infarct in the left upper
cerebellum.
IMPRESSION: 1. No evidence of acute intracranial disease.
2. Age advanced chronic small vessel disease.

## 2015-05-06 IMAGING — CR DG CHEST 2V
2 series · 2 of 2 positions shown · non-contrast
Comparison: October 18, 2012

CLINICAL DATA: Altered mental status

EXAM:
CHEST  2 VIEW

[w chest pa]
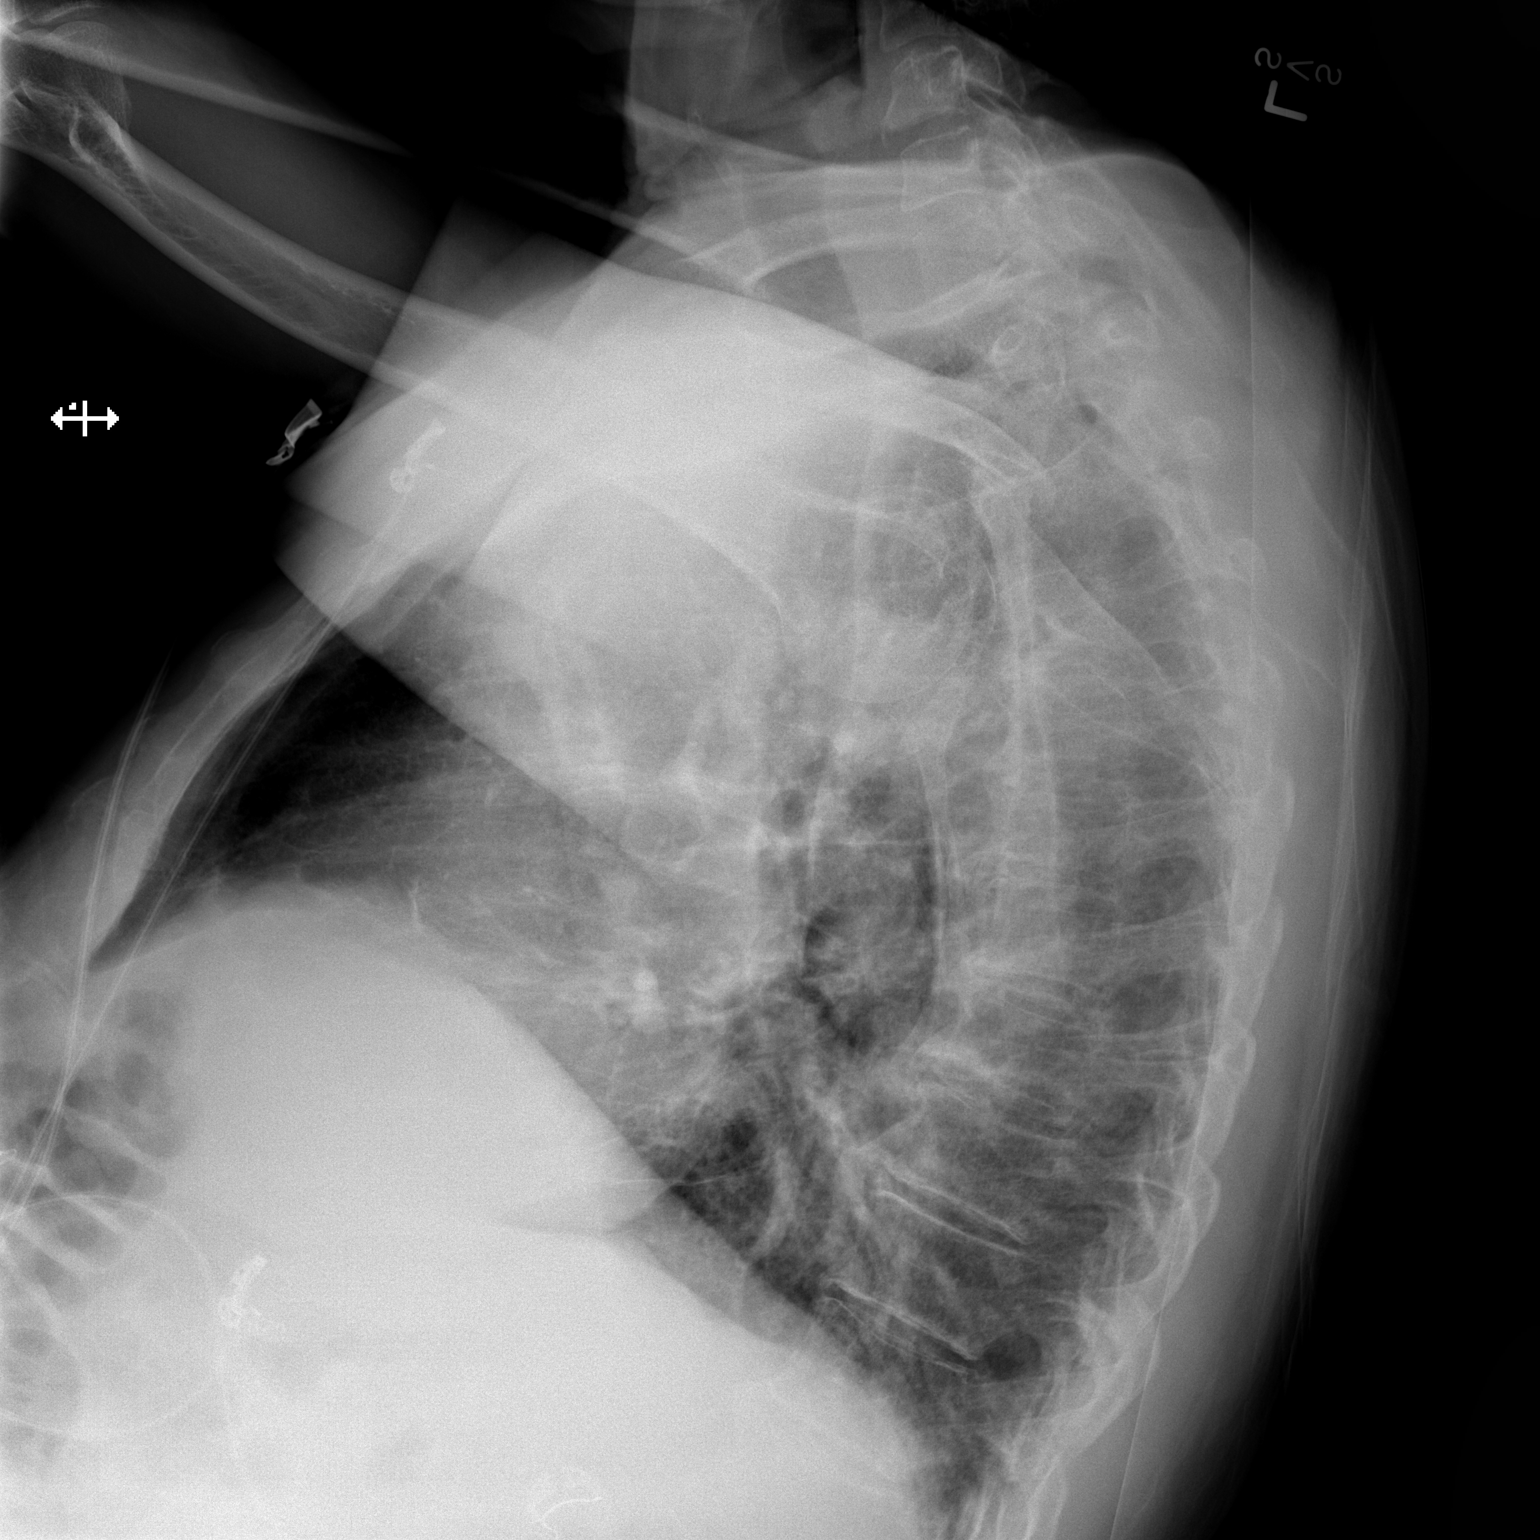

[x chest ap]
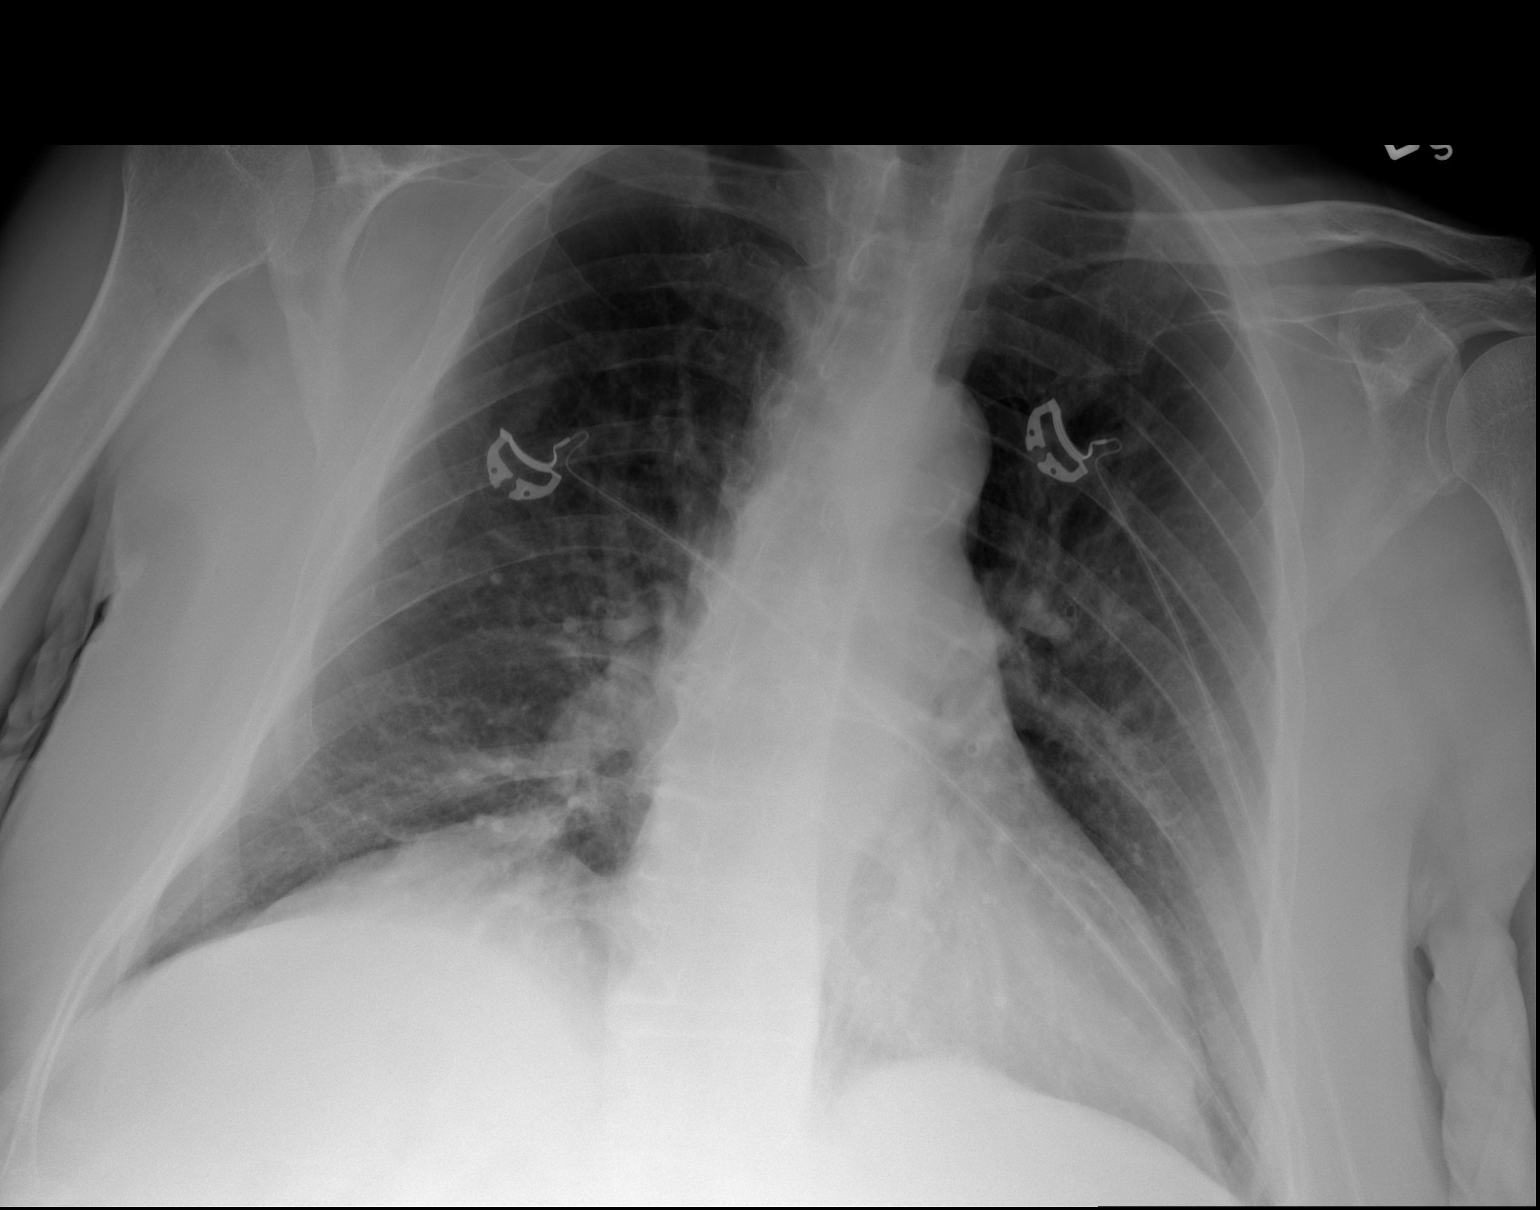

[2 of 2 positions shown; findings below may reference images not displayed]

FINDINGS: There is relative flattening of the hemidiaphragms suggesting a
degree of underlying emphysema. There is no edema or consolidation.
Heart is upper normal in size with normal pulmonary vascularity.
There is degenerative change in the thoracic spine.
IMPRESSION: Evidence of a degree of underlying emphysematous change. No edema or
consolidation.

## 2015-05-06 NOTE — Telephone Encounter (Signed)
Received faxed info as indicated below.  Placed in MDs box for completion. Domanic Matusek, Salome Spotted, CMA

## 2015-05-07 ENCOUNTER — Telehealth: Payer: Self-pay | Admitting: Family Medicine

## 2015-05-07 NOTE — Telephone Encounter (Signed)
Pt is wanting to speak to Dr. Andria Frames because Dr. Bonner Puna is taking to long to fill out and fax his wheelchair papers. He has been dealing with this for several days and he feels that his health is not being prioritized. Please call when you can . jw

## 2015-05-08 NOTE — Telephone Encounter (Signed)
Informed pt of below. Zimmerman Rumple, Arley Salamone D, CMA  

## 2015-05-08 NOTE — Telephone Encounter (Signed)
LVM for pt to call office to inform him that the forms for his wheelchair were faxed today. Katharina Caper, April D, [place]

## 2015-05-08 NOTE — Telephone Encounter (Signed)
Addended note to include more specific strength measurements in UE's which would preclude manual wheelchair use. Signed by Dr. Andria Frames, faxed to appropriate number.

## 2015-05-08 NOTE — Telephone Encounter (Signed)
I have spoken with Dr. Bonner Puna and verified that he is working diligently on completing the forms.

## 2015-05-09 NOTE — Telephone Encounter (Signed)
Contacted Riley Churches at [phone] @ [employer] to inquire about the forms to see if they had received them and she stated that the had but that they needed a bit more information and that Dr. McDiarmid would have to sign the addendum due to the fact that he signed the written order, did speak to Dr. Bonner Puna to inform him and will place back in his box for completion. He suggested that we contact pt and inform him that there is a little more information that they are requesting and that we should have this completed by the first of next week due to this being the last day of the week and late in the afternoon.  Katharina Caper, April D, [place]

## 2015-05-09 NOTE — Telephone Encounter (Signed)
Pt states [place] at [place].  Pt says to choose customer service option because Ms Lazarus Salines doesn't return her phone call.  Ms Lazarus Salines works with [employer] and told pt Dr Bonner Puna was not completing the form correctly.  Dr Bonner Puna is continuing to fax forms stating pt needs a power wheelchair.  Pt says he needs a power scooter. Pt says he has repeatedly told dr Bonner Puna his apartment has very heavy fire doors and his door to his apartment  that are too narrow for a power wheelchair.  Pt is not happy with Dr Bonner Puna at this time.

## 2015-05-09 NOTE — Telephone Encounter (Signed)
Form redone and faxed for a scooter to [facility]. Katharina Caper, Leanza Shepperson D, [place]

## 2015-05-12 ENCOUNTER — Telehealth: Payer: Self-pay | Admitting: Family Medicine

## 2015-05-12 NOTE — Telephone Encounter (Signed)
Pt is calling because he is having issues with being constipated. This is happening because of one of his medications. He would like to speak to April about this and also check on the status of his forms being filled out for the wheelchair. jw

## 2015-05-13 ENCOUNTER — Other Ambulatory Visit: Payer: Self-pay | Admitting: Family Medicine

## 2015-05-13 DIAGNOSIS — R351 Nocturia: Secondary | ICD-10-CM

## 2015-05-13 MED ORDER — OXYBUTYNIN CHLORIDE 5 MG PO TABS: 5.0000 mg | ORAL_TABLET | Freq: Four times a day (QID) | ORAL | Status: DC

## 2015-05-13 NOTE — Telephone Encounter (Signed)
Contacted pt late Monday afternoon about below and he stated that he wanted to see about getting a refill on his oxybutin.  He stated he would like it to be increased to 4 a day instead of 3.  He said that when he takes 4 a day he has no problem with with bladder control but with 3 he has problems with wetting himself. Also told him that there is a little more needed before they can begin the process to get his scooter. There is another form that needs to be completed by PCP and Dr. McDiarmid and also we need documentation from his previous visit weight since pt stated he was unable to weigh at last visit. Forwarding to PCP. Katharina Caper, Citlali Gautney D, CMA d

## 2015-05-13 NOTE — Telephone Encounter (Signed)
Form completed by myself and Dr. McDiarmid. Addendum to original office visit note addended (clearly labeled, see note from 04/28/2015), printed, and signed by myself and Dr. McDiarmid as well.   I also discussed this matter personally with Mr. Lattie Corns, asking that he practice more patience in matters involving coordination of his care. He has unnecessarily taken up significant staff resources at the clinic with his frequent calling, which has kept [place] from being able to address his concerns and from helping other patients.  Thank you to all our staff who have helped with Mr. Copeman's care.

## 2015-05-13 NOTE — Telephone Encounter (Signed)
Forms faxed to [facility]. Katharina Caper, April D, [place]

## 2015-05-16 ENCOUNTER — Other Ambulatory Visit: Payer: Self-pay | Admitting: Family Medicine

## 2015-05-19 ENCOUNTER — Ambulatory Visit (INDEPENDENT_AMBULATORY_CARE_PROVIDER_SITE_OTHER): Payer: Medicare Other | Admitting: Internal Medicine

## 2015-05-19 ENCOUNTER — Encounter: Payer: Self-pay | Admitting: Internal Medicine

## 2015-05-19 VITALS — BP 150/82 | HR 62 | Ht 64.0 in | Wt 215.0 lb

## 2015-05-19 DIAGNOSIS — Z79899 Other long term (current) drug therapy: Secondary | ICD-10-CM | POA: Diagnosis not present

## 2015-05-19 LAB — CBC
HCT: 44 % (ref 38.5–50.0)
HEMOGLOBIN: 14.2 g/dL (ref 13.2–17.1)
MCH: 27.3 pg (ref 27.0–33.0)
MCHC: 32.3 g/dL (ref 32.0–36.0)
MCV: 84.5 fL (ref 80.0–100.0)
MPV: 9.8 fL (ref 7.5–12.5)
Platelets: 291 10*3/uL (ref 140–400)
RBC: 5.21 MIL/uL (ref 4.20–5.80)
RDW: 16.9 % — ABNORMAL HIGH (ref 11.0–15.0)
WBC: 6.9 10*3/uL (ref 3.8–10.8)

## 2015-05-19 LAB — TSH: TSH: 1.34 m[IU]/L (ref 0.40–4.50)

## 2015-05-19 NOTE — Patient Instructions (Addendum)
Your physician recommends that you continue on your current medications as directed. Please refer to the Current Medication list given to you today.  Your physician recommends that you return for lab work in: today (cbc, cmet, tsh)  Your physician wants you to follow-up in: January 2018 with Dr. Harrington Challenger.  You will receive a reminder letter in the mail two months in advance. If you don't receive a letter, please call our office to schedule the follow-up appointment.

## 2015-05-19 NOTE — Progress Notes (Signed)
Cardiology Office Note   Date:  05/19/2015   ID:  Patrick Holt, Patrick Holt 21-Feb-1950, MRN [id number]  PCP:  Vance Gather, MD  Cardiologist:   Dorris Carnes, MD    F/U of CAD     History of Present Illness: Patrick Holt is a 65 y.o. male with a history of CAD, subdural hematoma questionable CVA, atrial fib (CHADS VAsc 3), GI bleeding Stopped eliqus, And shizophrenia  . 2-D echo in the hospital on 01/04/14 showed new LV dysfunction. There is moderate LVH, EF 45-50% with akinesis of the basal mid inferior myocardium. There is grade 1 diastolic dysfunction. Left atrium was moderately dilated. Carotid Dopplers were okay. Last 2-D echo 10/2012 showed normal LV function EF 60-65%. Stress Myoview in January 2015 showed a fixed defect in the basal inferior and inferolateral walls that might represent old infarct or diaphragmatic attenuation. It was not gated so no ejection fraction was estimated.  I saw th pt in September 2016  Complained of CP at that time  Atypical    Pt botherer by back pain  Falls a lot    Mother died in 06-13-2001 of brain tumor     Since seen in Sept breathing is fair  Occasional fluttering  Says he is going into afib a lot   Spends a lot of time talking about back pain and Xray studies    Echo in Feb 2016 LVEF was 50%    Outpatient Prescriptions Prior to Visit  Medication Sig Dispense Refill  . albuterol-ipratropium (COMBIVENT) 18-103 MCG/ACT inhaler Inhale 1 puff into the lungs every 6 (six) hours as needed for wheezing or shortness of breath. 14.7 g 5  . ALPRAZolam (XANAX) 1 MG tablet Take 1 tablet (1 mg total) by mouth 3 (three) times daily as needed for anxiety. 90 tablet 0  . amiodarone (PACERONE) 200 MG tablet Take 0.5 tablets (100 mg total) by mouth daily. 90 tablet 3  . aspirin EC 81 MG tablet Take 81 mg by mouth daily.    Marland Kitchen atorvastatin (LIPITOR) 40 MG tablet Take 1 tablet (40 mg total) by mouth daily. 90 tablet 3  . benzonatate (TESSALON) 100 MG capsule  Take 1 capsule (100 mg total) by mouth every 8 (eight) hours. 21 capsule 0  . divalproex (DEPAKOTE ER) 250 MG 24 hr tablet Take 2 tablets (500 mg total) by mouth daily. 30 tablet 2  . esomeprazole (NEXIUM) 40 MG capsule Take 1 capsule (40 mg total) by mouth daily at 12 noon. 90 capsule 0  . guaiFENesin (MUCINEX) 600 MG 12 hr tablet Take 600 mg by mouth 2 (two) times daily. OTC    . levETIRAcetam (KEPPRA) 500 MG tablet TAKE 1 TABLET(500 MG) BY MOUTH TWICE DAILY 180 tablet 0  . loperamide (IMODIUM) 2 MG capsule TAKE 1 TO 2 CAPSULES BY MOUTH 3 TIMES A DAY AS NEEDED FOR DIARRHEA 90 capsule 3  . meclizine (ANTIVERT) 25 MG tablet TAKE 1 TABLET (25 MG TOTAL) BY MOUTH 3 (THREE) TIMES DAILY AS NEEDED.  0  . Multiple Vitamin (MULTIVITAMIN WITH MINERALS) TABS tablet Take 1 tablet by mouth daily.    . naproxen (NAPROSYN) 500 MG tablet Take 1 tablet (500 mg total) by mouth 2 (two) times daily with a meal. 30 tablet 0  . oxybutynin (DITROPAN) 5 MG tablet Take 1 tablet (5 mg total) by mouth 4 (four) times daily. 120 tablet 2  . Potassium Chloride ER 20 MEQ TBCR Take 1 tablet by mouth every other  day.   0  . silodosin (RAPAFLO) 4 MG CAPS capsule Take 1 capsule (4 mg total) by mouth daily with breakfast. 30 capsule 0  . NEXIUM 40 MG capsule TAKE ONE CAPSULE BY MOUTH EVERY MORNING WITH BREAKFAST (Patient not taking: Reported on 05/19/2015) 90 capsule 3  . rOPINIRole (REQUIP) 2 MG tablet Take 0.5 tablets (1 mg total) by mouth at bedtime. (Patient not taking: Reported on 05/19/2015) 90 tablet 2   No facility-administered medications prior to visit.     Allergies:   Immune globulins; Ciprofloxacin; Flagyl; Lisinopril; Sulfa antibiotics; Betamethasone dipropionate; Bupropion hcl; Clobetasol; Codeine; Escitalopram oxalate; Fluoxetine hcl; Fluticasone-salmeterol; Furosemide; Paroxetine; Penicillins; Tacrolimus; Tetanus toxoid; and Tuberculin purified protein derivative   Past Medical History  Diagnosis Date  . 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 Obesity   . Major depression ([place])     with acute psychotic break in 06/2010  . Hypertension   . Hyperlipidemia   . Diverticulosis of colon   . COPD (chronic obstructive pulmonary disease) ([place])   . Anxiety   . GERD (gastroesophageal reflux disease)   . Vertigo   . Fibromyalgia   . Dermatomyositis ([place])   . Myocardial infarct ([place])     mulitple (1999, 2000, 2004)  . Raynaud's disease   . Narcotic dependence ([place])   . Peripheral neuropathy ([place])   . Internal hemorrhoids   . Ischemic heart disease   . Hiatal hernia   . Gastritis   . Diverticulitis   . Hx of adenomatous colonic polyps   . Nephrolithiasis   . Anemia   . Esophageal stricture   . Esophageal dysmotility   . Dermatomyositis ([place])   . Urge incontinence   . Otosclerosis   . Bipolar 1 disorder ([place])   . OCD (obsessive compulsive disorder)   . Sarcoidosis ([place])   . Paroxysmal a-fib ([place])   . Dysrhythmia     "irregular" (11/15/2012)  . Type II diabetes mellitus ([place])   . Seizures ([place])   . Headache(784.0)     "severe; get them often" (11/15/2012)  . Subarachnoid hemorrhage ([place]) 01/2012    with subdural  hematoma.   . Atrial fibrillation ([place]) 01/2012    with RVR  . Conversion disorder 06/2010  . History of narcotic addiction ([place])   . Asthma   . Cancer ([place])   . Collagen vascular disease ([place])   . Renal insufficiency     Past Surgical History  Procedure Laterality Date  . Knee arthroscopy w/ meniscal repair Left 2009  . Lumbar disc surgery    . Squamous papilloma   2010    removed by Dr. Constance Holster ENT, noted on tongue  . Esophagogastroduodenoscopy N/A 09/27/2012    Procedure: ESOPHAGOGASTRODUODENOSCOPY (EGD);  Surgeon: Lafayette Dragon, MD;  Location: [facility];  Service: Endoscopy;  Laterality: N/A;  . Colonoscopy N/A 09/27/2012    Procedure: COLONOSCOPY;  Surgeon: Lafayette Dragon, MD;  Location: [facility];  Service: Endoscopy;  Laterality: N/A;  . Cataract extraction w/ intraocular  lens implant Left   . Lymph node dissection Right 1970's    "neck; dr thought I had Hodgkins; turned out to be sarcoidosis" (11/15/2012)  . Tonsillectomy    . Carpal tunnel release Bilateral   . Cardiac catheterization    . Back surgery       Social History:  The patient  reports that he quit smoking about 5 months ago. His smoking use included Cigarettes. He has a 15 pack-year smoking history. He has never  used smokeless tobacco. He reports that he does not drink alcohol or use illicit drugs.   Family History:  The patient's family history includes Alcohol abuse in his mother; Coronary artery disease in his father; Depression in his mother; Diabetes in his mother and other; Heart disease in his mother; Hepatitis in his brother; Stroke in his mother.    ROS:  Please see the history of present illness. All other systems are reviewed and  Negative to the above problem except as noted.    PHYSICAL EXAM: VS:  BP 150/82 mmHg  Pulse 62  Ht 5\' 4"  (1.626 m)  Wt 215 lb (97.523 kg)  BMI 36.89 kg/m2  GEN: Well nourished, well developed, in no acute distress HEENT: normal Neck: no JVD, carotid bruits, or masses Cardiac: RRR; no murmurs, rubs, or gallops,no edema  Respiratory:  Wheezes bilaterallywith decrased airflow   GI: soft, nontender, nondistended, + BS  No hepatomegaly  MS: no deformity Moving all extremities   Skin: warm and dry, no rash Neuro:  Strength and sensation are intact Psych  Scattered thoughts     EKG:  EKG is ordered today.  SR 62 bpm     Lipid Panel    Component Value Date/Time   CHOL 104 07/17/2014 0805   Patrick 96 07/17/2014 0805   HDL 34* 07/17/2014 0805   CHOLHDL 3.1 07/17/2014 0805   VLDL 19 07/17/2014 0805   LDLCALC 51 07/17/2014 0805   LDLDIRECT 77 10/12/2013 1417      Wt Readings from Last 3 Encounters:  05/19/15 215 lb (97.523 kg)  04/07/15 219 lb 11.2 oz (99.655 kg)  03/07/15 221 lb (100.245 kg)      ASSESSMENT AND PLAN:  1  PAF  Currently  in SR  He says that he is in / out of afib more  He is not a coumadin candidate   I would check labs today  2.  CAD I am not convinced of angina  3.  cOPD  Wheezing on exam  Needs to toak to Dr Bonner Puna  4.  HTN  BP is midly increased  I would not increase meds at this time  Keep on same regimen     I will see him in Jan   Signed, Dorris Carnes, MD  05/19/2015 2:23 PM    [facility], [place],   [address] Phone: [phone]; Fax: [phone]

## 2015-05-20 ENCOUNTER — Other Ambulatory Visit: Payer: Self-pay | Admitting: *Deleted

## 2015-05-20 ENCOUNTER — Ambulatory Visit: Payer: Self-pay | Admitting: Family Medicine

## 2015-05-20 LAB — COMPREHENSIVE METABOLIC PANEL
ALBUMIN: 3.7 g/dL (ref 3.6–5.1)
ALT: 7 U/L — ABNORMAL LOW (ref 9–46)
AST: 12 U/L (ref 10–35)
Alkaline Phosphatase: 80 U/L (ref 40–115)
BUN: 7 mg/dL (ref 7–25)
CHLORIDE: 103 mmol/L (ref 98–110)
CO2: 30 mmol/L (ref 20–31)
Calcium: 8.9 mg/dL (ref 8.6–10.3)
Creat: 0.97 mg/dL (ref 0.70–1.25)
Glucose, Bld: 70 mg/dL (ref 65–99)
POTASSIUM: 4.3 mmol/L (ref 3.5–5.3)
Sodium: 140 mmol/L (ref 135–146)
TOTAL PROTEIN: 6.4 g/dL (ref 6.1–8.1)
Total Bilirubin: 0.5 mg/dL (ref 0.2–1.2)

## 2015-05-20 MED ORDER — ESOMEPRAZOLE MAGNESIUM 40 MG PO CPDR: 40.0000 mg | DELAYED_RELEASE_CAPSULE | Freq: Every day | ORAL | Status: DC

## 2015-05-20 NOTE — Telephone Encounter (Signed)
90 day supply. Ahman Dugdale L, RN  

## 2015-05-21 ENCOUNTER — Telehealth: Payer: Self-pay | Admitting: Internal Medicine

## 2015-05-21 NOTE — Telephone Encounter (Signed)
New Message:  Pt says he received a call from a nurse and he was just returning the call.

## 2015-05-21 NOTE — Telephone Encounter (Signed)
Patient was called to give lab results. He has been informed. See lab result note.

## 2015-05-27 DIAGNOSIS — M3390 Dermatopolymyositis, unspecified, organ involvement unspecified: Secondary | ICD-10-CM | POA: Diagnosis not present

## 2015-05-27 DIAGNOSIS — M4716 Other spondylosis with myelopathy, lumbar region: Secondary | ICD-10-CM | POA: Diagnosis not present

## 2015-06-02 ENCOUNTER — Other Ambulatory Visit: Payer: Self-pay | Admitting: *Deleted

## 2015-06-02 ENCOUNTER — Other Ambulatory Visit: Payer: Self-pay | Admitting: Family Medicine

## 2015-06-02 DIAGNOSIS — F411 Generalized anxiety disorder: Secondary | ICD-10-CM

## 2015-06-02 MED ORDER — ALPRAZOLAM 1 MG PO TABS: 1.0000 mg | ORAL_TABLET | Freq: Three times a day (TID) | ORAL | Status: DC | PRN

## 2015-06-02 NOTE — Telephone Encounter (Signed)
Need refill for his Alprazolam.

## 2015-06-02 NOTE — Telephone Encounter (Signed)
Pt called again about refill on alprazolam.  He doesn't want to run out. He hasnt been taking 3 a day so that is why it has lasted this long. He has called the pharmacy also

## 2015-06-02 NOTE — Telephone Encounter (Signed)
LVM for pt to call back to inform him of below. Zimmerman Rumple, April D, CMA  

## 2015-06-05 ENCOUNTER — Encounter: Payer: Self-pay | Admitting: Family Medicine

## 2015-06-05 ENCOUNTER — Ambulatory Visit (INDEPENDENT_AMBULATORY_CARE_PROVIDER_SITE_OTHER): Payer: Medicare Other | Admitting: Family Medicine

## 2015-06-05 ENCOUNTER — Ambulatory Visit: Payer: Self-pay | Admitting: Internal Medicine

## 2015-06-05 VITALS — BP 147/106 | HR 105 | Temp 98.2°F | Ht 64.0 in | Wt 217.6 lb

## 2015-06-05 DIAGNOSIS — R351 Nocturia: Secondary | ICD-10-CM

## 2015-06-05 DIAGNOSIS — R300 Dysuria: Secondary | ICD-10-CM

## 2015-06-05 DIAGNOSIS — F419 Anxiety disorder, unspecified: Secondary | ICD-10-CM

## 2015-06-05 LAB — POCT URINALYSIS DIPSTICK
Blood, UA: NEGATIVE
Glucose, UA: NEGATIVE
LEUKOCYTES UA: NEGATIVE
Nitrite, UA: NEGATIVE
PH UA: 6.5
PROTEIN UA: 30
Spec Grav, UA: 1.02
UROBILINOGEN UA: 2

## 2015-06-05 LAB — POCT UA - MICROSCOPIC ONLY

## 2015-06-05 MED ORDER — OXYBUTYNIN CHLORIDE 5 MG PO TABS: 5.0000 mg | ORAL_TABLET | Freq: Four times a day (QID) | ORAL | Status: DC

## 2015-06-05 NOTE — Patient Instructions (Signed)
Thank you for coming in today!  We are ruling out a urinary tract infection We will call you tomorrow with the results and any treatment that is required I have refilled your oxybutynin   Our clinic's number is [phone]. Feel free to call any time with questions or concerns. We will answer any questions after hours with our 24-hour emergency line at that number as well.   - Dr. Bonner Puna

## 2015-06-05 NOTE — Progress Notes (Signed)
Subjective: Patrick Holt is a 65 y.o. male presenting for anxiety follow up.  He reports daily nervous feelings in general and often around the staff at his living facility. These symptoms are improved with xanax, which he is taking as needed - usually twice daily. Denies adverse effects of medication.   Also endorsing very rare, mild dysuria in addition to usual urinary urgency and frequency with nocturia. No fevers, abd pain, N/V/D, hematuria. Sees urology.   - ROS: No SI/HI, hallucinations, drowsiness, trouble breathing, or opioid pain medications taken.  - Current smoker   Objective: BP 147/106 mmHg  Pulse 105  Temp(Src) 98.2 F (36.8 C) (Oral)  Ht 5\' 4"  (1.626 m)  Wt 217 lb 9.6 oz (98.703 kg)  BMI 37.33 kg/m2  SpO2 94% Gen: 65 y.o. male in no distress Psych: Neatly groomed and wearing shirt with many stains and crumbs on it. Maintains good eye contact. Only somewhat cooperative, frequently talking for long stretches without interruption, though thought process is goal-directed, albeit perseverative. Speech is normal volume and rate. Mood is anxious. Does not appear to be responding to any internal stimuli.   Assessment/Plan: [name] is a 65 y.o. male here for anxiety.  Anxiety Stable, improved with xanax without signs of overuse or adverse effects, not coadministered with opioids. Will continue current treatment plan.   Nocturia With new mild dysuric symptoms, will check UA. Otherwise, refill oxybutynin at QID dosing and follow up per urology. Refuses rapaflo because this caused constipation on 3 separate therapeutic trials.

## 2015-06-06 DIAGNOSIS — F419 Anxiety disorder, unspecified: Secondary | ICD-10-CM | POA: Insufficient documentation

## 2015-06-06 NOTE — Assessment & Plan Note (Signed)
With new mild dysuric symptoms, will check UA. Otherwise, refill oxybutynin at QID dosing and follow up per urology. Refuses rapaflo because this caused constipation on 3 separate therapeutic trials.

## 2015-06-06 NOTE — Assessment & Plan Note (Signed)
Stable, improved with xanax without signs of overuse or adverse effects, not coadministered with opioids. Will continue current treatment plan.

## 2015-06-13 ENCOUNTER — Other Ambulatory Visit: Payer: Self-pay | Admitting: *Deleted

## 2015-06-13 ENCOUNTER — Other Ambulatory Visit: Payer: Self-pay | Admitting: Family Medicine

## 2015-06-13 MED ORDER — AMIODARONE HCL 100 MG PO TABS: 100.0000 mg | ORAL_TABLET | Freq: Every day | ORAL | Status: DC

## 2015-06-13 MED ORDER — AMIODARONE HCL 200 MG PO TABS: 100.0000 mg | ORAL_TABLET | Freq: Every day | ORAL | Status: DC

## 2015-06-13 NOTE — Telephone Encounter (Signed)
Received a fax from [facility] requesting a new Rx for amiodarone 20 mg. Patient stated dosage was change to 100 mg.  Please advise.  Derl Barrow, RN

## 2015-06-25 IMAGING — CR DG CHEST 1V PORT
1 series · 1 of 1 positions shown · non-contrast
Comparison: 11/08/2012

CLINICAL DATA: Chest pain.  Atrial fibrillation.

EXAM:
PORTABLE CHEST - 1 VIEW

[AP]
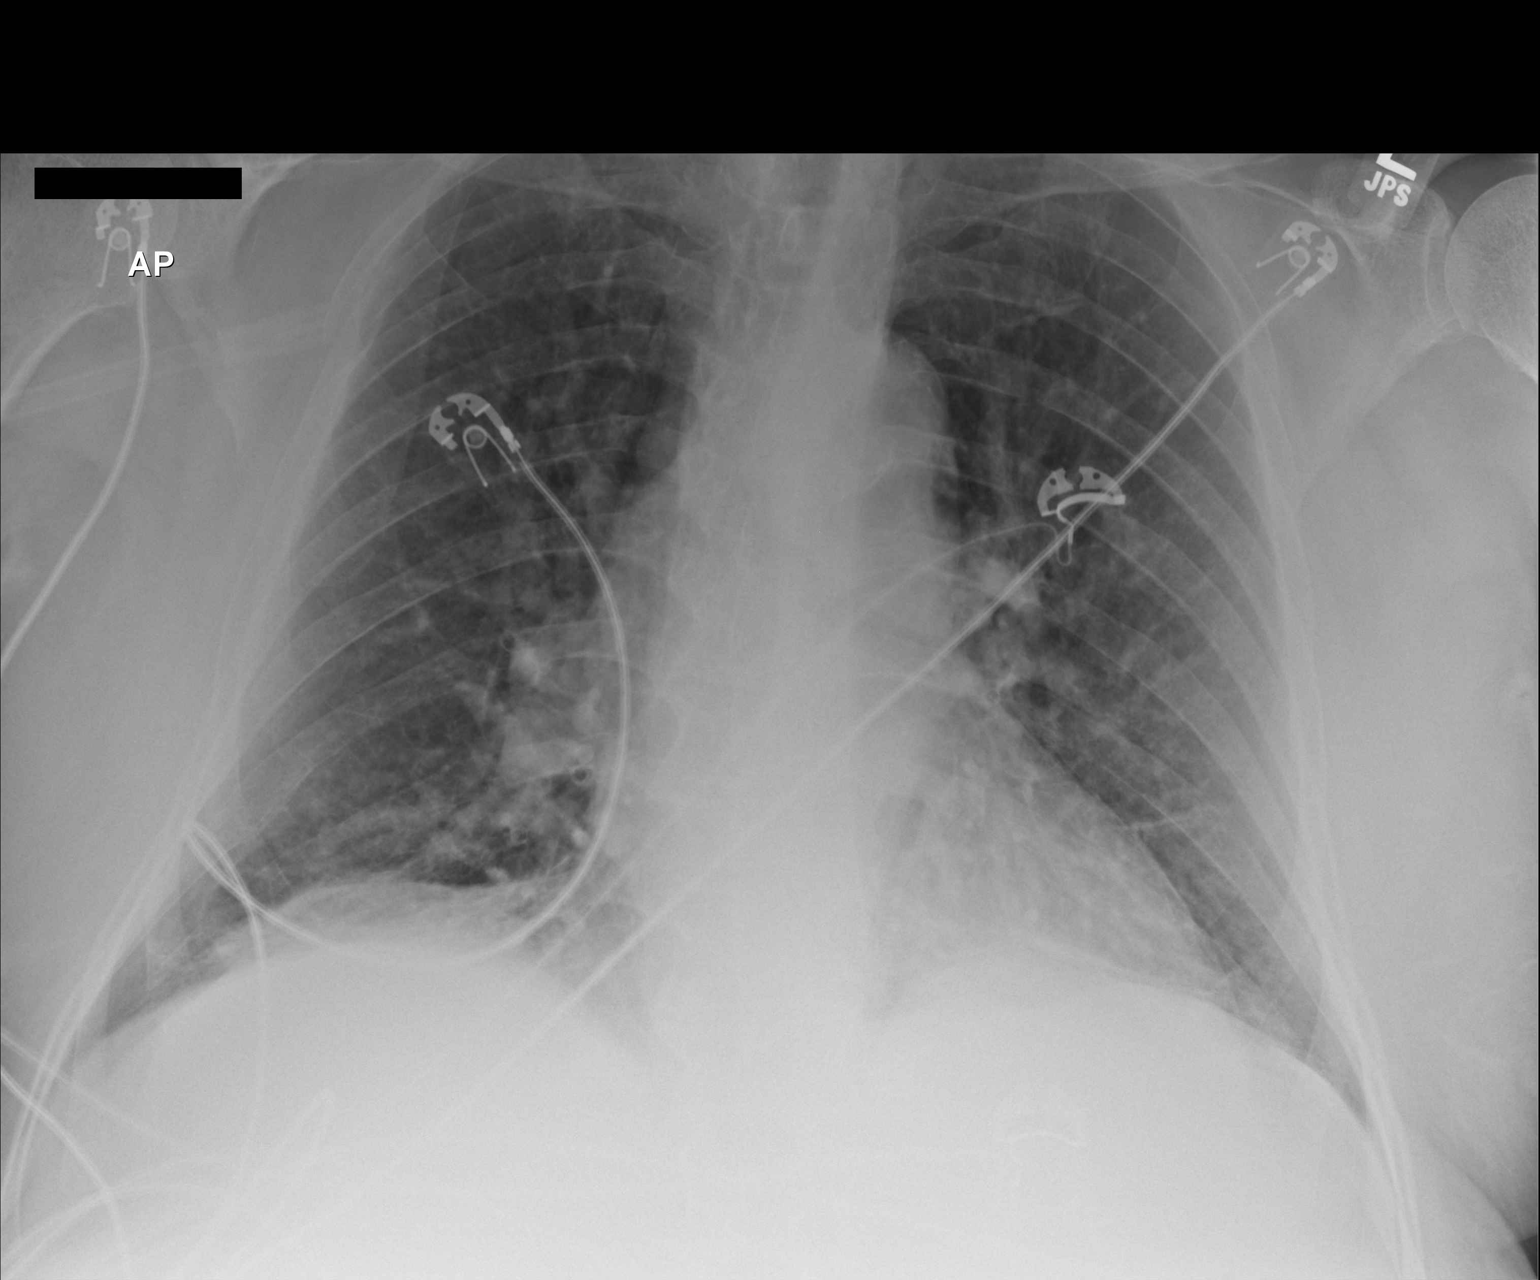

[1 of 1 positions shown; findings below may reference images not displayed]

FINDINGS: There is slight new pulmonary vascular congestion. Minimal linear
atelectasis at the left base. No effusions or lung consolidation. No
acute osseous abnormality.
IMPRESSION: New pulmonary vascular congestion.  Minimal left base atelectasis.

## 2015-06-27 IMAGING — CR DG CHEST 2V
3 series · 3 of 3 positions shown · non-contrast
Comparison: One-view chest 12/28/2012.

CLINICAL DATA: COPD.  Irregular heart rate.  Left-sided chest pain.

EXAM:
CHEST  2 VIEW

[w chest pa]
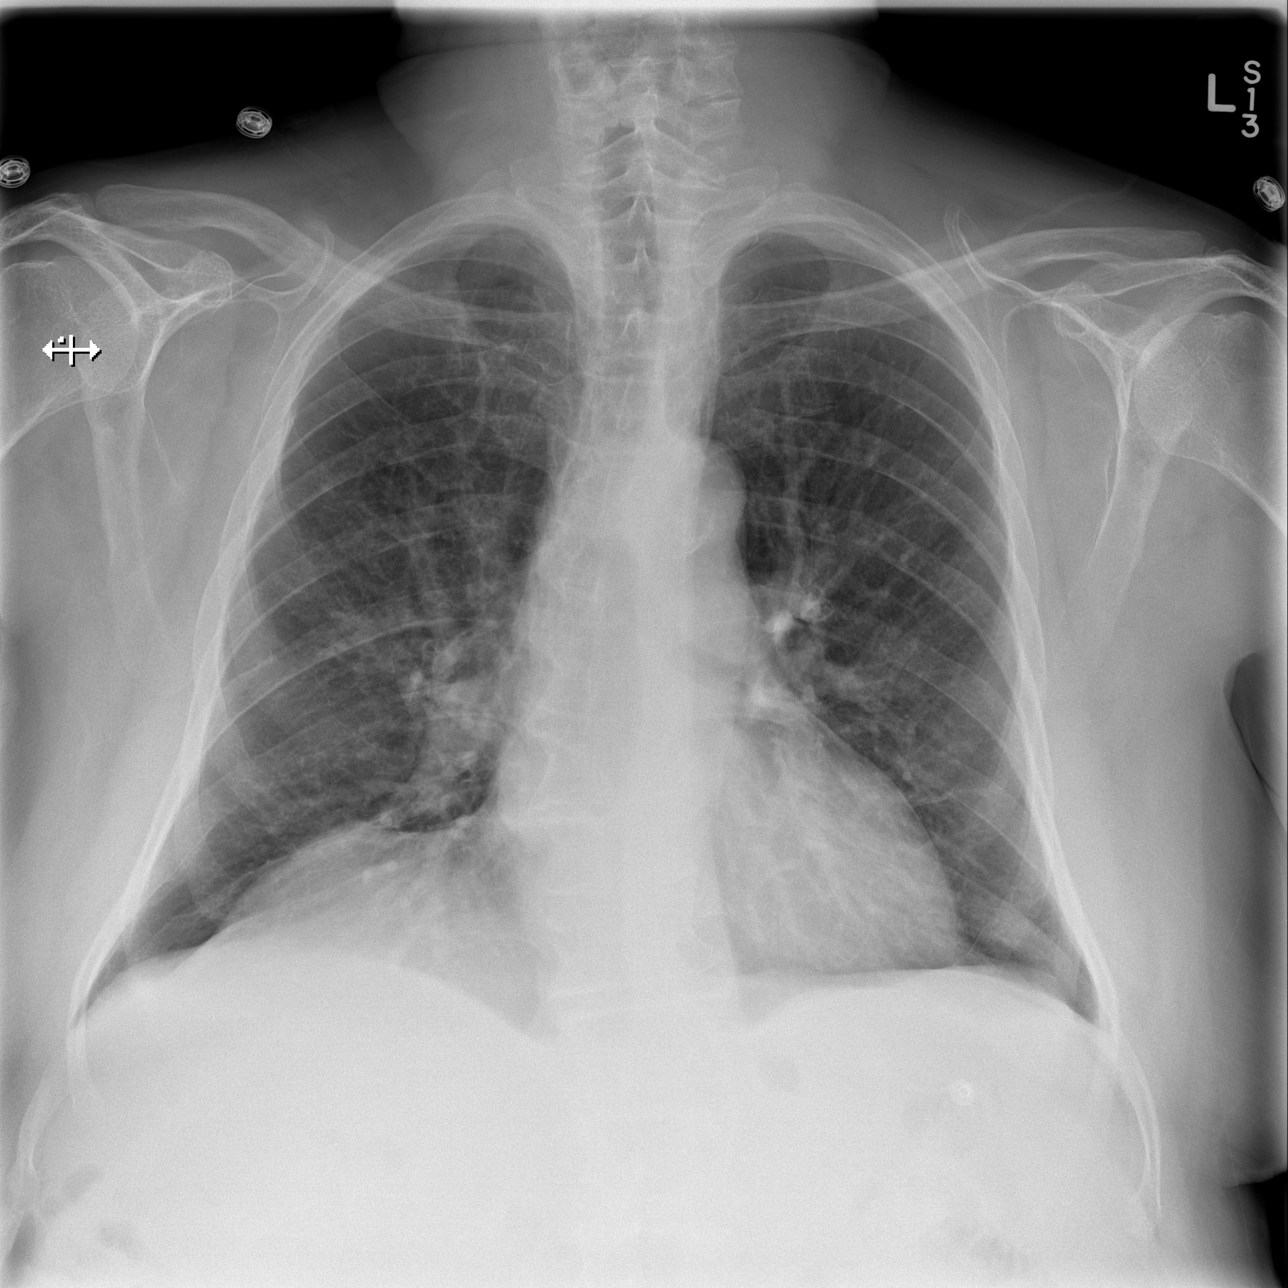

[w chest lat (1 of 2)]
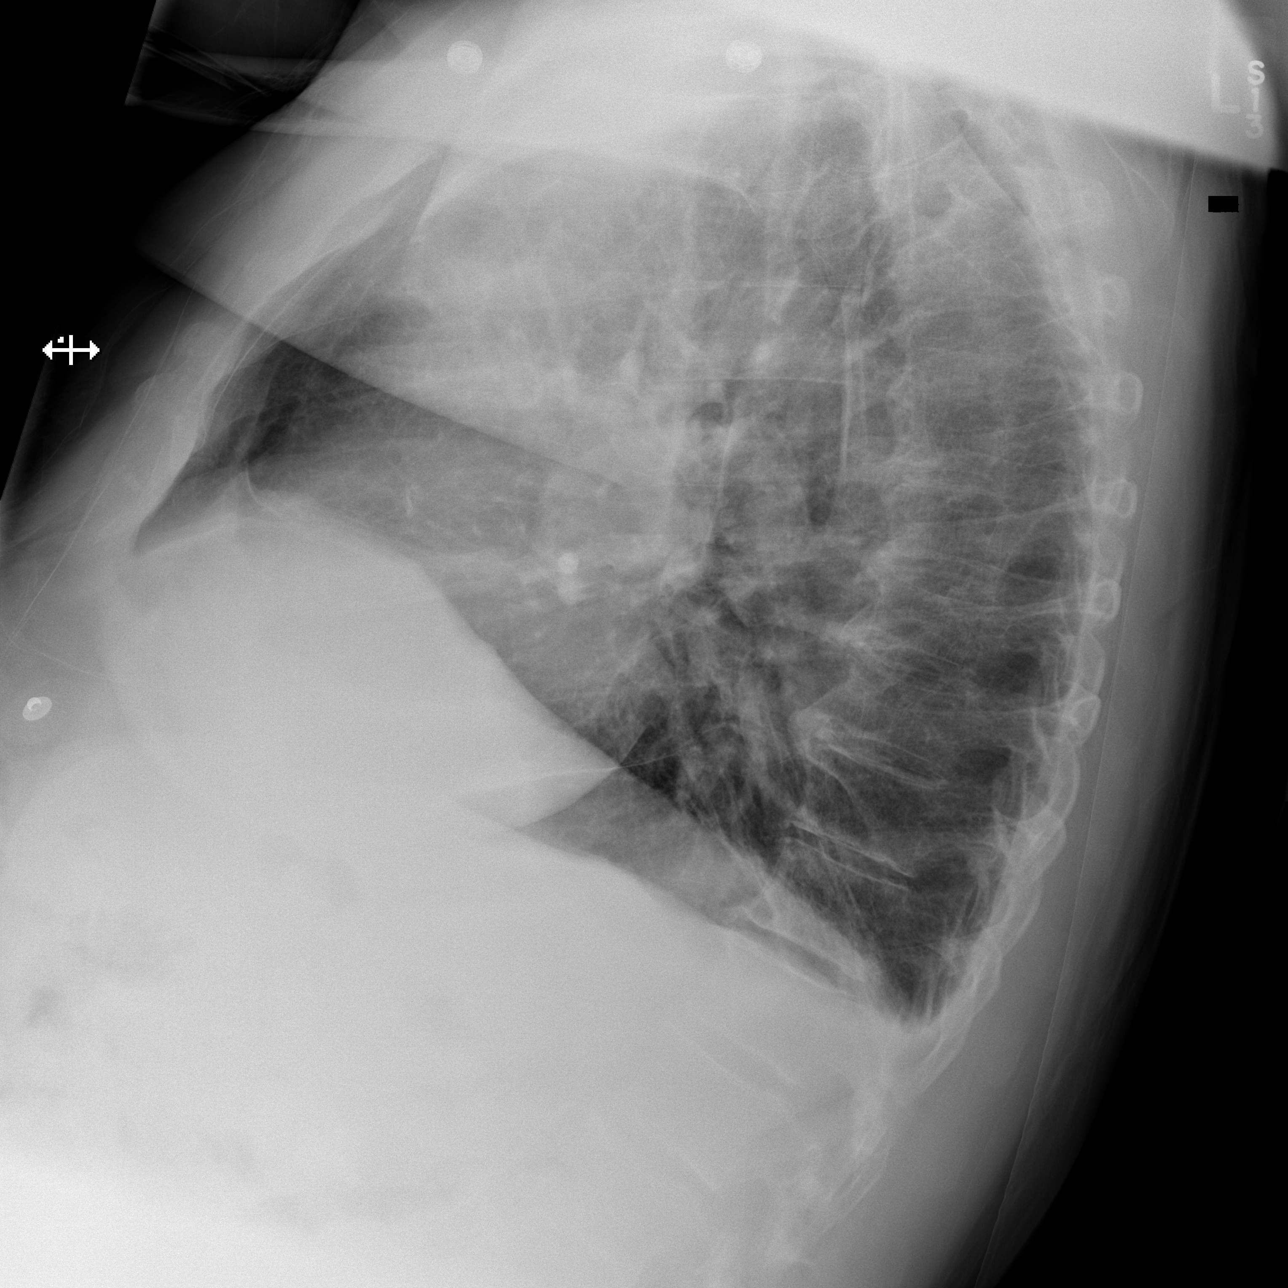

[w chest lat (2 of 2)]
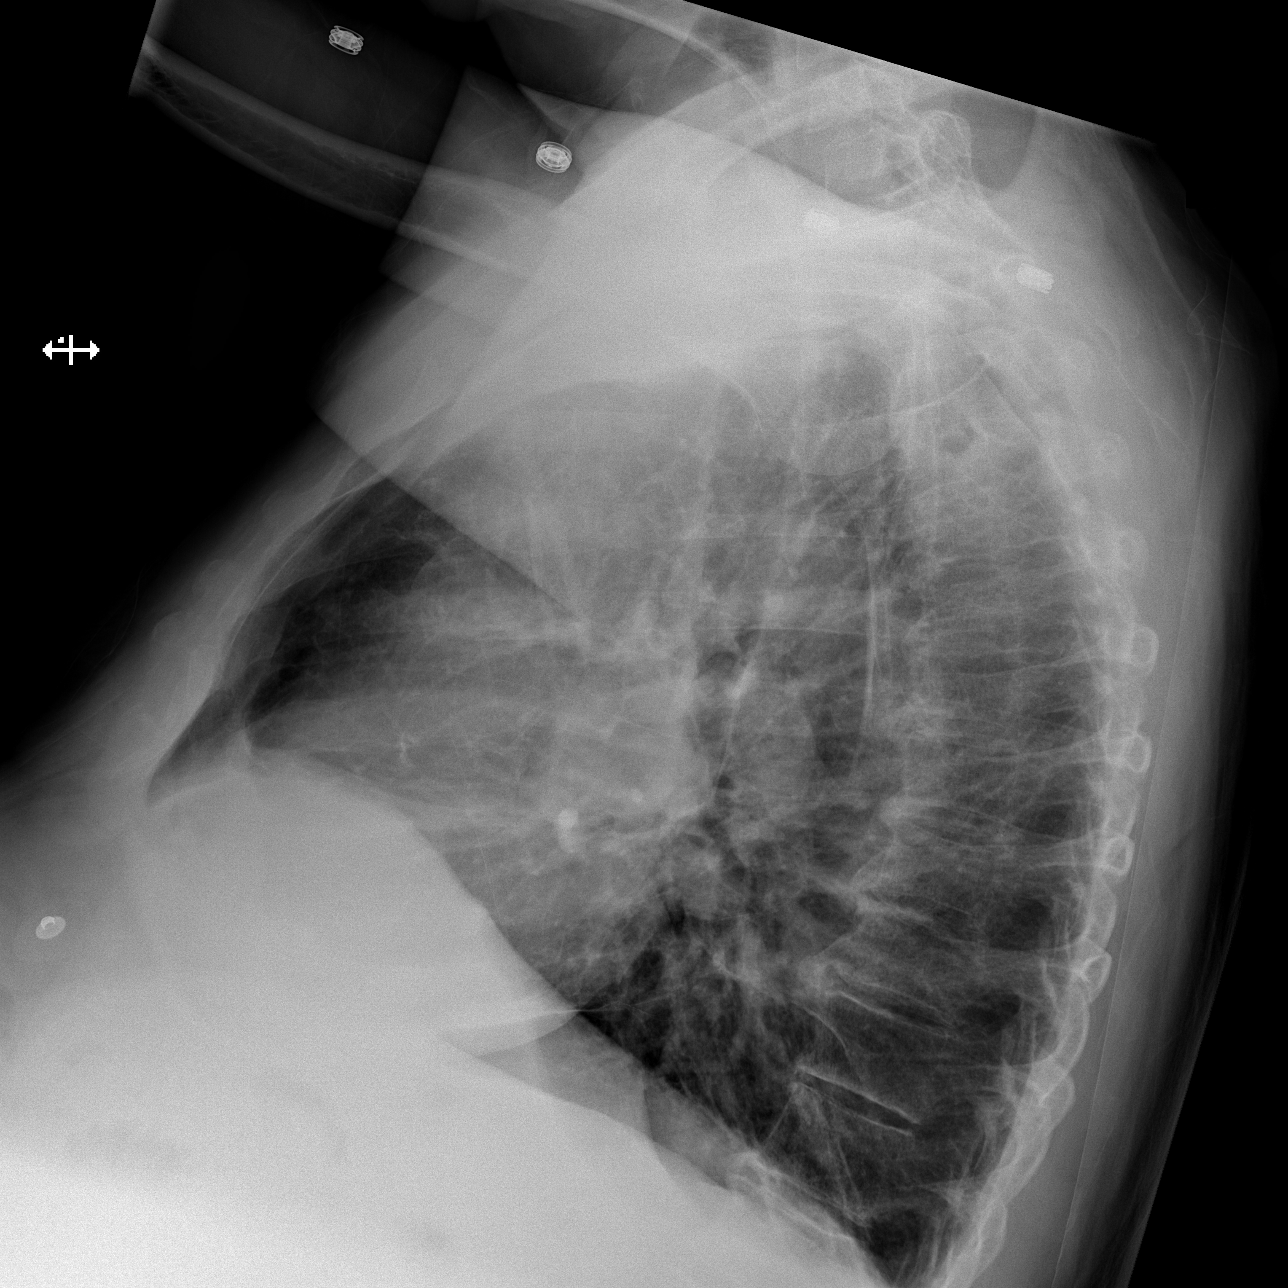

[3 of 3 positions shown; findings below may reference images not displayed]

FINDINGS: Heart size is normal. Mild pulmonary vascular congestion persists.
Small effusions are evident. Minimal bibasilar atelectasis is
present.
IMPRESSION: 1. Mild congestive heart failure with mild pulmonary vascular
congestion with small effusions and associated atelectasis.
2. No significant airspace consolidation.

## 2015-07-07 ENCOUNTER — Encounter: Payer: Self-pay | Admitting: Psychology

## 2015-07-07 NOTE — Progress Notes (Signed)
Patient ID: Patrick Holt, male   DOB: 08/08/1950, 65 y.o.   MRN: [id number]  Health and Well-being  1. Physical health needs: COPD, dermatomyositis, atrial fibrillation, seizures, hypertension, back pain, impaired mobility, multiple falls, nocturia  2. Physical health problems impacting mental wellbeing?: back pain, impaired mobility  3. Lifestyle impacting mental wellbeing?: Not reported in chart.  4. Other concerns re: mental wellbeing?: Anxiety, possible Bipolar I Disorder, Delusional Disorder, Schizophrenia, or schizoaffective disorder (noted as all 3 at different points in chart). As chart does not note negative symptoms, current diagnosis of schizoaffective disorder is likely the most accurate.  Social Environment  1. Home environment -- safety and stability?: Currently living in an assisted living facility. This living arrangement seems to be safe and stable.  2. Daily activities impacting wellbeing?: Not recently reported in chart. Patient stopped smoking fairly recently after moving into his current living facility.   3. Social Network?: Not married, and does not report chidlren. Patient reports frequent interaction with staff at his living facility, which makes him anxious. Patient most likely has poor social support in general.  4. Financial resources?: Not recently reported in chart. Patient is living in an assisted living facility. Patient receives (or received in the past) disability benefits for dermatomyositis.   Health Literacy and Communication  1. Understand their health and wellbeing?: Patient appears to have some understanding of his health and wellbeing. Based on chart review, it is unclear if his lack of cooperation with medical instructions by PCP is due to lack of understanding, or some other cause (e.g., lack of motivation, depression).  2. Engage in healthcare discussions?: Patient appears to have the ability to engage in healthcare discussion to some extent,  although it has been noted that he is sometimes uncooperative.   Does the patient have a firm diagnosis (i.e., established through assessment)? No firm psychological diagnoses  What psychotropic medications does the patient currently take? Alprazolam as needed (noted as usually twice daily), ropinirole, divalproex  Psychotropic medication history: Alprazolam, ropinirole, divalproex.  What is the patient's medical utilization (e.g., how frequently are they being seen and by whom?)  Patient is seen at [facility] 1-2 times per month, with multiple ED visits in 2016. These visits seem to have been reduced in 2017, with the reduction somewhat coinciding with his move into an assisted living facility. Patient is also seen approximately every 3 months by cardiology.  Recommendations: Patient has long-term benzodiazepine use and has clearly documented difficulty ambulating, and a history of falls. As a result, his continued use of Xanax may constitute an increased risk of falls. Although this risk is mitigated by recent instructions for the patient to use a wheelchair or motorized scooter and his residence in an assisted living facility, long-term use of benzodiazepines may also increase his risk of dementia. Thus, I would recommend weaning patient off Xanax.

## 2015-07-16 ENCOUNTER — Other Ambulatory Visit: Payer: Self-pay | Admitting: Family Medicine

## 2015-07-16 DIAGNOSIS — F411 Generalized anxiety disorder: Secondary | ICD-10-CM

## 2015-07-16 MED ORDER — ALPRAZOLAM 1 MG PO TABS: 1.0000 mg | ORAL_TABLET | Freq: Three times a day (TID) | ORAL | Status: DC | PRN

## 2015-07-16 NOTE — Telephone Encounter (Signed)
Pt called and needs a refill on his Xanax left up front for pickup. Please call patient when ready so  That he can get a ride to come and get this. jw

## 2015-07-16 NOTE — Telephone Encounter (Signed)
Will refill this and leave Rx up front for pick up. Documentation from 07/07/2015 noted in EMR by integrated care. Appreciate their attention to this complicated patient. Agree with recommendation to wean off xanax, though I believe his success remaining at ALF is due in part to the mitigation of his anxiety that this benzodiazepine has provided. Fall risk was, in my opinion, primarily from weakness and will hopefully be reduced with motorized wheelchair.   Patient may benefit from integrated care visit to further explore his anxiety.

## 2015-07-17 ENCOUNTER — Ambulatory Visit (INDEPENDENT_AMBULATORY_CARE_PROVIDER_SITE_OTHER): Payer: Self-pay | Admitting: *Deleted

## 2015-07-17 VITALS — BP 158/93 | HR 59 | Temp 98.3°F

## 2015-07-17 DIAGNOSIS — Z136 Encounter for screening for cardiovascular disorders: Secondary | ICD-10-CM

## 2015-07-17 DIAGNOSIS — Z0130 Encounter for examination of blood pressure without abnormal findings: Secondary | ICD-10-CM

## 2015-07-17 DIAGNOSIS — I10 Essential (primary) hypertension: Secondary | ICD-10-CM

## 2015-07-17 MED ORDER — AMLODIPINE BESYLATE 5 MG PO TABS: 5.0000 mg | ORAL_TABLET | Freq: Every day | ORAL | Status: DC

## 2015-07-17 NOTE — Progress Notes (Signed)
BP elevation is consistent over the past few months. His cardiologist saw him in April and recommended against starting medications at that time, but given the ongoing nature of this, he should be treated. In an effort to minimize side effects (already has significant urinary problems) and chances of allergic reaction (has 18 documented allergies, one of which is lisinopril; so will avoid ACE/ARB), I will try low dose amlodipine and follow up in 2 weeks for recheck of BP. I have discussed this with him and he is in agreement to go pick this up and return in 2 weeks.

## 2015-07-17 NOTE — Assessment & Plan Note (Signed)
BP elevation is consistent over the past few months. His cardiologist saw him in April and recommended against starting medications at that time, but given the ongoing nature of this, he should be treated. In an effort to minimize side effects (already has significant urinary problems) and chances of allergic reaction (has 18 documented allergies, one of which is lisinopril; so will avoid ACE/ARB), I will try low dose amlodipine and follow up in 2 weeks for recheck of BP.

## 2015-07-17 NOTE — Progress Notes (Signed)
   Patient walked in (motorized wheelchair) to pick up Rx for alprazolam and requested a blood pressure check. States he fell out of electric wheelchair yesterday as he tried to stand but no injury received.  Patient states EMS came to house yesterday, took BP at 190/101 and wanted to take patient to ED. Patient refused. States he had mild chest pain during night (sometime after 7 pm but doesn't know exact time) and "left arm didn't feel quite right." Denies chest pain or arm symptoms at present. Offered same day visit but patient declined. Stated [facility] bus would be waiting on him.   Filed Vitals:   07/17/15 1101  BP: 158/93  Pulse: 59  Temp: 98.3 F (36.8 C)  SpO2 93%  Preceptor, Dr. Lindell Noe made aware Note forwarded to PCP Alisson Rozell, Orvis Brill, RN

## 2015-07-21 NOTE — Telephone Encounter (Signed)
Pt has picked up Rx. Made him an appointment for Monday June 12th to meet with Dr. Bonner Puna. Ottis Stain, CMA

## 2015-07-28 ENCOUNTER — Encounter: Payer: Self-pay | Admitting: Family Medicine

## 2015-07-28 ENCOUNTER — Ambulatory Visit (INDEPENDENT_AMBULATORY_CARE_PROVIDER_SITE_OTHER): Payer: Medicare Other | Admitting: Family Medicine

## 2015-07-28 VITALS — BP 143/96 | HR 70 | Temp 98.5°F | Ht 64.0 in | Wt 247.6 lb

## 2015-07-28 DIAGNOSIS — I4891 Unspecified atrial fibrillation: Secondary | ICD-10-CM | POA: Diagnosis not present

## 2015-07-28 DIAGNOSIS — I10 Essential (primary) hypertension: Secondary | ICD-10-CM | POA: Diagnosis not present

## 2015-07-28 MED ORDER — AMLODIPINE BESYLATE 10 MG PO TABS: 10.0000 mg | ORAL_TABLET | Freq: Every day | ORAL | Status: DC

## 2015-07-28 MED ORDER — AMIODARONE HCL 100 MG PO TABS: 100.0000 mg | ORAL_TABLET | Freq: Every day | ORAL | Status: DC

## 2015-07-28 NOTE — Progress Notes (Signed)
Subjective: Patrick Holt is a 65 y.o. male with a history of HTN and PAF here for hypertension follow up.   He reports not checking BP's at home but has no concerns about his blood pressure and denies any side effects of medications. Taking all medications as directed. No longer coughing.   - ROS: Denies CP, SOB, palpitations, syncope, dizziness, orthopnea, PND, frequent headaches, vision changes, claudication, leg swelling. - PMFSH: Former smoker, no EtOH, no illicit drugs. - Medications: reviewed and updated  Objective: BP 143/96 mmHg  Pulse 70  Temp(Src) 98.5 F (36.9 C) (Oral)  Ht 5\' 4"  (1.626 m)  Wt 247 lb 9.6 oz (112.311 kg)  BMI 42.48 kg/m2 Gen: 65 y.o.male in no distress Neck: brisk carotid upstroke, no bruits; thyroid not enlarged  Pulm: Non-labored breathing room air; CTAB CV: Regular rate with normal S1/S2, no murmur; no LE edema, no JVD; DP and radial pulses symmetric and 2+. Homan's sign absent. cap refill < 2 sec.  GI: Nontender, nondistended, no HSM   Assessment & Plan: Patrick Holt is a 65 y.o. male here for hypertension, currently slightly uncontrolled.    See problem list.

## 2015-07-28 NOTE — Patient Instructions (Addendum)
We made the following medication changes today:  - INCREASE NORVASC FROM 5MG  TO 10MG  DAILY - High blood pressure is best treated by losing weight, taking medications as directed, avoiding salt in your diet and increasing potassium from fruits and vegetables (called the DASH diet). - Follow up in 2 weeks   Take care,  - Dr. Bonner Puna  DASH Eating Plan DASH stands for "Dietary Approaches to Stop Hypertension." The DASH eating plan is a healthy eating plan that has been shown to reduce high blood pressure (hypertension). Additional health benefits may include reducing the risk of type 2 diabetes mellitus, heart disease, and stroke. The DASH eating plan may also help with weight loss. WHAT DO I NEED TO KNOW ABOUT THE DASH EATING PLAN? For the DASH eating plan, you will follow these general guidelines:  Choose foods with a percent daily value for sodium of less than 5% (as listed on the food label).  Use salt-free seasonings or herbs instead of table salt or sea salt.  Check with your health care provider or pharmacist before using salt substitutes.  Eat lower-sodium products, often labeled as "lower sodium" or "no salt added."  Eat fresh foods.  Eat more vegetables, fruits, and low-fat dairy products.  Choose whole grains. Look for the word "whole" as the first word in the ingredient list.  Choose fish and skinless chicken or [place] more often than red meat. Limit fish, poultry, and meat to 6 oz (170 g) each day.  Limit sweets, desserts, sugars, and sugary drinks.  Choose heart-healthy fats.  Limit cheese to 1 oz (28 g) per day.  Eat more home-cooked food and less restaurant, buffet, and fast food.  Limit fried foods.  Cook foods using methods other than frying.  Limit canned vegetables. If you do use them, rinse them well to decrease the sodium.  When eating at a restaurant, ask that your food be prepared with less salt, or no salt if possible. WHAT FOODS CAN I EAT? Seek help  from a dietitian for individual calorie needs. Grains Whole grain or whole wheat bread. Brown rice. Whole grain or whole wheat pasta. Quinoa, bulgur, and whole grain cereals. Low-sodium cereals. Corn or whole wheat flour tortillas. Whole grain cornbread. Whole grain crackers. Low-sodium crackers. Vegetables Fresh or frozen vegetables (raw, steamed, roasted, or grilled). Low-sodium or reduced-sodium tomato and vegetable juices. Low-sodium or reduced-sodium tomato sauce and paste. Low-sodium or reduced-sodium canned vegetables.  Fruits All fresh, canned (in natural juice), or frozen fruits. Meat and Other Protein Products Ground beef (85% or leaner), grass-fed beef, or beef trimmed of fat. Skinless chicken or [place]. Ground chicken or [place]. Pork trimmed of fat. All fish and seafood. Eggs. Dried beans, peas, or lentils. Unsalted nuts and seeds. Unsalted canned beans. Dairy Low-fat dairy products, such as skim or 1% milk, 2% or reduced-fat cheeses, low-fat ricotta or cottage cheese, or plain low-fat yogurt. Low-sodium or reduced-sodium cheeses. Fats and Oils Tub margarines without trans fats. Light or reduced-fat mayonnaise and salad dressings (reduced sodium). Avocado. Safflower, olive, or canola oils. Natural peanut or almond butter. Other Unsalted popcorn and pretzels.  WHAT FOODS ARE NOT RECOMMENDED? Grains White bread. White pasta. White rice. Refined cornbread. Bagels and croissants. Crackers that contain trans fat. Vegetables Creamed or fried vegetables. Vegetables in a cheese sauce. Regular canned vegetables. Regular canned tomato sauce and paste. Regular tomato and vegetable juices. Fruits Dried fruits. Canned fruit in light or heavy syrup. Fruit juice. Meat and Other Protein Products Fatty cuts  of meat. Ribs, chicken wings, bacon, sausage, bologna, salami, chitterlings, fatback, hot dogs, bratwurst, and packaged luncheon meats. Salted nuts and seeds. Canned beans with  salt. Dairy Whole or 2% milk, cream, half-and-half, and cream cheese. Whole-fat or sweetened yogurt. Full-fat cheeses or blue cheese. Nondairy creamers and whipped toppings. Processed cheese, cheese spreads, or cheese curds. Condiments Onion and garlic salt, seasoned salt, table salt, and sea salt. Canned and packaged gravies. Worcestershire sauce. Tartar sauce. Barbecue sauce. Teriyaki sauce. Soy sauce, including reduced sodium. Steak sauce. Fish sauce. Oyster sauce. Cocktail sauce. Horseradish. Ketchup and mustard. Meat flavorings and tenderizers. Bouillon cubes. Hot sauce. Tabasco sauce. Marinades. Taco seasonings. Relishes. Fats and Oils Butter, stick margarine, lard, shortening, ghee, and bacon fat. Coconut, palm kernel, or palm oils. Regular salad dressings. Other Pickles and olives. Salted popcorn and pretzels. The items listed above may not be a complete list of foods and beverages to avoid. Contact your dietitian for more information. WHERE CAN I FIND MORE INFORMATION? National Heart, Lung, and Blood Institute: travelstabloid.com

## 2015-07-28 NOTE — Assessment & Plan Note (Signed)
Slightly above goal.  - Will increase amlodipine 5mg  > 10mg . Monitor closely to ensure this doesn't worsen falling.  - Primary elements of DASH diet and weight loss strategies reviewed in detail. Handout given.  - Follow up in 2 weeks for BP check

## 2015-07-28 NOTE — Assessment & Plan Note (Signed)
Maintaining NSR on amiodarone. Will be due for follow up CXR in Nov. Has had coughing in the past year but this has not persisted. TSH and LFTs wnl April 2017.

## 2015-07-29 ENCOUNTER — Other Ambulatory Visit: Payer: Self-pay | Admitting: Family Medicine

## 2015-07-29 NOTE — Telephone Encounter (Signed)
Pt says he needs a refill on his alpralozam. He doesn't think he doesn't have enough to last until the end of June.  Please advise

## 2015-07-29 NOTE — Telephone Encounter (Signed)
Pt informed of below. Zimmerman Rumple, April D, CMA  

## 2015-07-29 NOTE — Telephone Encounter (Signed)
90 tablets were prescribed 5/31, so he should have enough to last at LEAST one month. While it is prescribed three times daily as needed, it is not intended that he would need it three times daily every day. He will need to follow up in early July.

## 2015-08-06 ENCOUNTER — Other Ambulatory Visit: Payer: Self-pay | Admitting: *Deleted

## 2015-08-06 DIAGNOSIS — R351 Nocturia: Secondary | ICD-10-CM

## 2015-08-07 MED ORDER — OXYBUTYNIN CHLORIDE 5 MG PO TABS: 5.0000 mg | ORAL_TABLET | Freq: Four times a day (QID) | ORAL | Status: DC

## 2015-08-07 NOTE — Telephone Encounter (Signed)
2nd request for 90 day supply.  Derl Barrow, RN

## 2015-08-07 NOTE — Addendum Note (Signed)
Addended by: Vance Gather B on: 08/07/2015 05:06 PM   Modules accepted: Orders

## 2015-08-15 ENCOUNTER — Telehealth: Payer: Self-pay | Admitting: Family Medicine

## 2015-08-15 NOTE — Telephone Encounter (Signed)
Contacted [facility] and spoke with Sunday Spillers and told her that pt is now on 10 mg per office note at visit on the 12th. Contacted pt as well and let him know that I contacted them, also informed him that it may be his oxybutynin 5mg  that the pharmacy may have been calling about since it was sent in on 08/07/15.  He said he would contact them and see if this is what they had contacted him about. Katharina Caper, April D, [place]

## 2015-08-15 NOTE — Telephone Encounter (Signed)
Please call the patients pharmacy to clarify the medication for his BP, Amlodipine was prescribed 10 MG but now the pharmacy is calling him and saying they have 5 mg for him to pick up. He doesn't understand what they mean. Can we call and get this fixed and then let the patient know. jw

## 2015-08-20 ENCOUNTER — Telehealth: Payer: Self-pay | Admitting: Student in an Organized Health Care Education/Training Program

## 2015-08-20 ENCOUNTER — Encounter: Payer: Self-pay | Admitting: Student

## 2015-08-20 ENCOUNTER — Ambulatory Visit (INDEPENDENT_AMBULATORY_CARE_PROVIDER_SITE_OTHER): Payer: Medicare Other | Admitting: Student

## 2015-08-20 VITALS — BP 111/80 | HR 112 | Temp 98.5°F | Wt 250.0 lb

## 2015-08-20 DIAGNOSIS — N3941 Urge incontinence: Secondary | ICD-10-CM

## 2015-08-20 DIAGNOSIS — K589 Irritable bowel syndrome without diarrhea: Secondary | ICD-10-CM

## 2015-08-20 DIAGNOSIS — I10 Essential (primary) hypertension: Secondary | ICD-10-CM | POA: Diagnosis not present

## 2015-08-20 MED ORDER — LOPERAMIDE HCL 2 MG PO CAPS: ORAL_CAPSULE | ORAL | Status: DC

## 2015-08-20 NOTE — Assessment & Plan Note (Signed)
Refilled his loperamide today

## 2015-08-20 NOTE — Assessment & Plan Note (Addendum)
Called pharmacy to verify patient's complaint about inadequate quantity of oxybutynin dispensed. Called and talked to his pharmacy after the patient left. Confirmed that his prescription for oxybutynin is ready for pickup. Called the patient to let him know that his prescription is ready for pickup.

## 2015-08-20 NOTE — Patient Instructions (Signed)
It was great seeing you today! We have addressed the following issues today  1. Bladder incontinence: I will call you pharmacy and verify about your prescription. Will get back to you after I called the pharmacy 2. Hypertension: Continue taking her blood pressure medication as prescribed. Your blood pressure is within normal range today 3. Diarrhea: I will call in your prescription to the pharmacy.     If we did any lab work today, and the results require attention, either me or my nurse will get in touch with you. If everything is normal, you will get a letter in mail. If you don't hear from [place] in two weeks, please give [place] a call. Otherwise, I look forward to talking with you again at our next visit. If you have any questions or concerns before then, please call the clinic at [phone].  Please bring all your medications to every doctors visit   Sign up for My Chart to have easy access to your labs results, and communication with your Primary care physician.    Please check-out at the front desk before leaving the clinic.   Take Care,

## 2015-08-20 NOTE — Assessment & Plan Note (Signed)
Blood pressure within normal range. -We'll continue his medication as they are.

## 2015-08-20 NOTE — Progress Notes (Signed)
   Subjective:    Patient ID: Patrick Holt, male    DOB: April 26, 1950, 65 y.o.   MRN: [id number]  CC: High blood pressure  HPI # Hypertension: here for follow up on blood pressure. Says his blood pressure was high about two weeks ago and was started on new medication. Reports taking his medications as prescribed. Denies issues with his blood pressure meds.    #Urinary incontinence: says pharmacy has not given him enough of his oxybutynin when he got it filled about 2 weeks ago. He says he was told to wait until July 12 before his next refill. However, he states he has only 2 pills left. He reports taking 4 tablets a day. Called his pharmacy after the patient left. Confirmed that his prescription for oxybutynin is ready for pickup. Called the patient to let him know that his prescription is pickup.   Off note, patient is very tangential and hard to focus. He also complains about the pharmacy & previous providers. He says the pharmacy does not count his pills appropriately. He also complains that his previous provider doesn't respond his refill requests.   Patient got up to leave before we even finished talking saying that his bus will be here in a few minutes.   Review of Systems  Per HPI Objective:   Physical Exam Filed Vitals:   08/20/15 1416  BP: 111/80  Pulse: 112  Temp: 98.5 F (36.9 C)  Weight: 250 lb (113.399 kg)    GEN: Tangential, hard to direct, NAD CVS: RRR, normal s1 and s2, no murmurs, no edema RESP: no increased work of breathing NEURO: A&O x3, no gross defecits  PSYCH: tangential, hard to direct, appears angry  Assessment & Plan:  Essential hypertension, benign Blood pressure within normal range. -We'll continue his medication as they are.  Urge incontinence Called pharmacy to verify patient's complaint about inadequate quantity of oxybutynin dispensed. Called and talked to his pharmacy after the patient left. Confirmed that his prescription for oxybutynin is  ready for pickup. Called the patient to let him know that his prescription is ready for pickup.   Irritable bowel syndrome (IBS) Refilled his loperamide today

## 2015-08-20 NOTE — Telephone Encounter (Signed)
Pt is calling because he was suppose to have refills called in today to the pharmacy. He is needing Oxybutin, Xanax, Nexium, Imodium. jw

## 2015-08-21 ENCOUNTER — Other Ambulatory Visit: Payer: Self-pay | Admitting: Student

## 2015-08-21 ENCOUNTER — Telehealth: Payer: Self-pay | Admitting: Family Medicine

## 2015-08-21 DIAGNOSIS — K219 Gastro-esophageal reflux disease without esophagitis: Secondary | ICD-10-CM

## 2015-08-21 DIAGNOSIS — F411 Generalized anxiety disorder: Secondary | ICD-10-CM

## 2015-08-21 MED ORDER — ALPRAZOLAM 1 MG PO TABS: 1.0000 mg | ORAL_TABLET | Freq: Three times a day (TID) | ORAL | Status: DC | PRN

## 2015-08-21 MED ORDER — ESOMEPRAZOLE MAGNESIUM 40 MG PO CPDR: 40.0000 mg | DELAYED_RELEASE_CAPSULE | Freq: Every day | ORAL | Status: DC

## 2015-08-21 NOTE — Telephone Encounter (Signed)
Pt came into office today to pick up Rx and I contacted Dr. Cyndia Skeeters who came and gave me the Rx to give the pt.  Pt was very upset and he stated that I should tell Dr. Cyndia Skeeters that after he gets through talking to whoever he needs to speak to that he better be looking for another job.  He said that he was a "stupid son of a , and I don't like to cuss" at which time i told him he probably shouldn't and that he should just be glad he had his Rx. As i was closing the door pt started to apologize at which time i told him to have a good day and closed the door. Katharina Caper, Mertis Mosher D

## 2015-08-21 NOTE — Telephone Encounter (Addendum)
Spoke with him. He agrees his negative behavior and cursing was not appropriate and apologizes. He is agreeable to have Dr Cyndia Skeeters be his PCP He will attempt to refrain from cursing or making negative comments in the future He understands he will be dismissed from the practice if this continues

## 2015-09-15 ENCOUNTER — Telehealth: Payer: Self-pay | Admitting: *Deleted

## 2015-09-15 NOTE — Telephone Encounter (Signed)
Patrick Holt had a rambling set of concerns.  I have concern about his general cognition.    His ask was to refill three medications before this Friday: 1. Oxybutin 2. Alprazolam 3. Loperamide.  I will route this request to his PCP.    Of note, he also complained of frequent falls.  And someone has given him the notion that he may have a brain tumor.  I requested that he make an appointment with his PCP in the next couple of weeks.  I would think it would be reasonable, in fact, good medicine, to decrease or even wean off his alprazolam given his falls and impaired, slow cognition.

## 2015-09-15 NOTE — Telephone Encounter (Signed)
Pt calling in stating that he missed a call from dr Andria Frames and he would like for him to call him back. Lerline Valdivia [name] Holter, CMA

## 2015-09-16 ENCOUNTER — Other Ambulatory Visit: Payer: Self-pay | Admitting: Student

## 2015-09-16 DIAGNOSIS — F411 Generalized anxiety disorder: Secondary | ICD-10-CM

## 2015-09-16 MED ORDER — ALPRAZOLAM 0.5 MG PO TABS: 0.5000 mg | ORAL_TABLET | Freq: Three times a day (TID) | ORAL | 0 refills | Status: DC | PRN

## 2015-09-16 NOTE — Telephone Encounter (Signed)
Called patient in regards to his refill request. Advised patient to call pharmacy for his refill on oxybutynin and loperamide. Also discussed about his alprazolam. Patient with history of anxiety with long-term use of benzodiazepines. Also history of falls which is concerning. At this time it is even hard to tell if his symptoms are due to anxiety or withdrawal from benzo. He has multiple medications listed under his allergy including Lexapro, Prozac, paroxetine and Wellbutrin. The reactions are unknown. I discussed with the patient about going down on his alprazolam from 1 mg to 0.5 mg every 8 hours as needed for anxiety, quantity 90. Patient's response was "whatever". I also advised him to call the front desk office and set an appointment with me to discuss about anxiety.

## 2015-09-17 NOTE — Telephone Encounter (Signed)
Pt would like to have his alprazolam called into the pharmacy, the hard copy is upfront. Pt said the pharmacy told him we could call it in and pt does not want to spend the $30 to get him here and to pharmacy is he doesn't have to. Please advise. Thanks! ep

## 2015-09-19 ENCOUNTER — Other Ambulatory Visit: Payer: Self-pay | Admitting: Student

## 2015-10-02 ENCOUNTER — Telehealth: Payer: Self-pay | Admitting: Student

## 2015-10-14 ENCOUNTER — Other Ambulatory Visit: Payer: Self-pay | Admitting: Student

## 2015-10-14 DIAGNOSIS — R351 Nocturia: Secondary | ICD-10-CM

## 2015-10-14 DIAGNOSIS — F411 Generalized anxiety disorder: Secondary | ICD-10-CM

## 2015-10-14 NOTE — Telephone Encounter (Signed)
Pt will be going to another PCP Sept 8.  He has been told by the new dr no prescriptions will be filled at the first visit. His alprazolam runs out on 10-24-15. He would like to have this sent to [facility] on [address]. He wants his refills for potassium, oxybutin, napraxen, meclizine, lopressor, kepra sent to [facility] to [address]

## 2015-10-16 ENCOUNTER — Other Ambulatory Visit: Payer: Self-pay | Admitting: *Deleted

## 2015-10-16 DIAGNOSIS — M5412 Radiculopathy, cervical region: Secondary | ICD-10-CM

## 2015-10-16 DIAGNOSIS — I10 Essential (primary) hypertension: Secondary | ICD-10-CM

## 2015-10-16 DIAGNOSIS — I2510 Atherosclerotic heart disease of native coronary artery without angina pectoris: Secondary | ICD-10-CM

## 2015-10-16 DIAGNOSIS — R569 Unspecified convulsions: Secondary | ICD-10-CM

## 2015-10-16 DIAGNOSIS — E785 Hyperlipidemia, unspecified: Secondary | ICD-10-CM

## 2015-10-16 DIAGNOSIS — R351 Nocturia: Secondary | ICD-10-CM

## 2015-10-16 DIAGNOSIS — K219 Gastro-esophageal reflux disease without esophagitis: Secondary | ICD-10-CM

## 2015-10-16 DIAGNOSIS — F411 Generalized anxiety disorder: Secondary | ICD-10-CM

## 2015-10-16 DIAGNOSIS — I4891 Unspecified atrial fibrillation: Secondary | ICD-10-CM

## 2015-10-16 NOTE — Telephone Encounter (Signed)
Pharmacist from [facility] called stating that [facility]  He is needing a lot of refills by Sept 7, 2017.  Please advise.  Derl Barrow, RN

## 2015-10-16 NOTE — Telephone Encounter (Signed)
Pt called again. He wants to make sure the RX are sent t [facility] to [address]. He also needs a refill methotrexate for 3 pills per day

## 2015-10-17 ENCOUNTER — Telehealth: Payer: Self-pay | Admitting: *Deleted

## 2015-10-17 DIAGNOSIS — I4891 Unspecified atrial fibrillation: Secondary | ICD-10-CM

## 2015-10-17 MED ORDER — AMIODARONE HCL 100 MG PO TABS: 100.0000 mg | ORAL_TABLET | Freq: Every day | ORAL | 2 refills | Status: DC

## 2015-10-17 MED ORDER — NAPROXEN 500 MG PO TABS: 500.0000 mg | ORAL_TABLET | Freq: Two times a day (BID) | ORAL | 0 refills | Status: DC

## 2015-10-17 MED ORDER — MECLIZINE HCL 25 MG PO TABS: ORAL_TABLET | ORAL | 0 refills | Status: DC

## 2015-10-17 MED ORDER — ASPIRIN EC 81 MG PO TBEC: 81.0000 mg | DELAYED_RELEASE_TABLET | Freq: Every day | ORAL | 3 refills | Status: DC

## 2015-10-17 MED ORDER — IPRATROPIUM-ALBUTEROL 18-103 MCG/ACT IN AERO: 1.0000 | INHALATION_SPRAY | Freq: Four times a day (QID) | RESPIRATORY_TRACT | 5 refills | Status: DC | PRN

## 2015-10-17 MED ORDER — AMIODARONE HCL 200 MG PO TABS: 100.0000 mg | ORAL_TABLET | Freq: Every day | ORAL | 3 refills | Status: DC

## 2015-10-17 MED ORDER — GUAIFENESIN ER 600 MG PO TB12: 600.0000 mg | ORAL_TABLET | Freq: Two times a day (BID) | ORAL | 1 refills | Status: DC

## 2015-10-17 MED ORDER — ATORVASTATIN CALCIUM 40 MG PO TABS: 40.0000 mg | ORAL_TABLET | Freq: Every day | ORAL | 3 refills | Status: DC

## 2015-10-17 MED ORDER — LEVETIRACETAM 500 MG PO TABS: ORAL_TABLET | ORAL | 0 refills | Status: DC

## 2015-10-17 MED ORDER — ESOMEPRAZOLE MAGNESIUM 40 MG PO CPDR: 40.0000 mg | DELAYED_RELEASE_CAPSULE | Freq: Every day | ORAL | 0 refills | Status: DC

## 2015-10-17 MED ORDER — DIVALPROEX SODIUM ER 250 MG PO TB24: 500.0000 mg | ORAL_TABLET | Freq: Every day | ORAL | 2 refills | Status: DC

## 2015-10-17 MED ORDER — OXYBUTYNIN CHLORIDE 5 MG PO TABS: 5.0000 mg | ORAL_TABLET | Freq: Four times a day (QID) | ORAL | 3 refills | Status: DC

## 2015-10-17 MED ORDER — ADULT MULTIVITAMIN W/MINERALS CH: 1.0000 | ORAL_TABLET | Freq: Every day | ORAL | 3 refills | Status: DC

## 2015-10-17 MED ORDER — AMLODIPINE BESYLATE 10 MG PO TABS: 10.0000 mg | ORAL_TABLET | Freq: Every day | ORAL | 1 refills | Status: DC

## 2015-10-17 NOTE — Telephone Encounter (Signed)
Prior Authorization received from [place] for amiodarone 100 mg tab. Formulary preferred for 200 mg tablets. [facility] pharmacy is requesting to change Rx to amiodarone 200 mg: take half of 200 mg tab daily. PA form placed in provider box.  Derl Barrow, RN

## 2015-10-21 ENCOUNTER — Other Ambulatory Visit: Payer: Self-pay | Admitting: Student

## 2015-10-21 DIAGNOSIS — J410 Simple chronic bronchitis: Secondary | ICD-10-CM

## 2015-10-21 MED ORDER — IPRATROPIUM-ALBUTEROL 20-100 MCG/ACT IN AERS: 1.0000 | INHALATION_SPRAY | Freq: Four times a day (QID) | RESPIRATORY_TRACT | Status: DC | PRN

## 2015-10-21 NOTE — Progress Notes (Signed)
Sent prescription for Combivent Respiment to his pharmacy

## 2015-10-21 NOTE — Telephone Encounter (Signed)
Pt is calling for a refill on his Potassium called in. He still needs Xanax . Patrick Holt

## 2015-10-22 ENCOUNTER — Other Ambulatory Visit: Payer: Self-pay | Admitting: Student

## 2015-10-22 DIAGNOSIS — F411 Generalized anxiety disorder: Secondary | ICD-10-CM

## 2015-10-22 MED ORDER — ALPRAZOLAM 0.5 MG PO TABS: 0.5000 mg | ORAL_TABLET | Freq: Three times a day (TID) | ORAL | 0 refills | Status: DC | PRN

## 2015-10-23 ENCOUNTER — Other Ambulatory Visit: Payer: Self-pay | Admitting: Student

## 2015-10-23 NOTE — Telephone Encounter (Signed)
Attempted to call patient about his refill request on metoprolol and potassium. Patient didn't answer. It appears patient hasn't taken those medication in about a year. His last potassium level was within normal limit. I recommend checking his labs before refilling this prescription. It is almost time to check his CMP and thyroid function as patient is on amiodarone. So he may need office visit. Please advise. Thanks!

## 2015-10-23 NOTE — Telephone Encounter (Signed)
Needs refill on his potassium.  [facility] [address].

## 2015-10-23 NOTE — Telephone Encounter (Signed)
Xanax has already been called in. It doesn't seem he is on Metoprolol. Advice him to follow up with his cardiologist on his atrial fibrillation.  Thanks!

## 2015-10-23 NOTE — Telephone Encounter (Signed)
Message was given to pt  °

## 2015-10-24 ENCOUNTER — Encounter: Payer: Self-pay | Admitting: Student

## 2015-10-29 ENCOUNTER — Emergency Department (HOSPITAL_COMMUNITY)
Admission: EM | Admit: 2015-10-29 | Discharge: 2015-10-29 | Disposition: A | Discharge status: Home / Self Care | Payer: Medicare Other | Attending: Emergency Medicine | Admitting: Emergency Medicine

## 2015-10-29 ENCOUNTER — Emergency Department (HOSPITAL_COMMUNITY): Payer: Medicare Other

## 2015-10-29 ENCOUNTER — Encounter (HOSPITAL_COMMUNITY): Payer: Self-pay | Admitting: *Deleted

## 2015-10-29 DIAGNOSIS — J449 Chronic obstructive pulmonary disease, unspecified: Secondary | ICD-10-CM | POA: Insufficient documentation

## 2015-10-29 DIAGNOSIS — R791 Abnormal coagulation profile: Secondary | ICD-10-CM | POA: Diagnosis not present

## 2015-10-29 DIAGNOSIS — T148 Other injury of unspecified body region: Secondary | ICD-10-CM | POA: Diagnosis not present

## 2015-10-29 DIAGNOSIS — Z79899 Other long term (current) drug therapy: Secondary | ICD-10-CM | POA: Diagnosis not present

## 2015-10-29 DIAGNOSIS — I252 Old myocardial infarction: Secondary | ICD-10-CM | POA: Insufficient documentation

## 2015-10-29 DIAGNOSIS — Y92009 Unspecified place in unspecified non-institutional (private) residence as the place of occurrence of the external cause: Secondary | ICD-10-CM | POA: Diagnosis not present

## 2015-10-29 DIAGNOSIS — Z7982 Long term (current) use of aspirin: Secondary | ICD-10-CM | POA: Insufficient documentation

## 2015-10-29 DIAGNOSIS — E860 Dehydration: Secondary | ICD-10-CM | POA: Diagnosis not present

## 2015-10-29 DIAGNOSIS — R296 Repeated falls: Secondary | ICD-10-CM | POA: Diagnosis not present

## 2015-10-29 DIAGNOSIS — Z859 Personal history of malignant neoplasm, unspecified: Secondary | ICD-10-CM | POA: Diagnosis not present

## 2015-10-29 DIAGNOSIS — S62109A Fracture of unspecified carpal bone, unspecified wrist, initial encounter for closed fracture: Secondary | ICD-10-CM | POA: Diagnosis not present

## 2015-10-29 DIAGNOSIS — E1140 Type 2 diabetes mellitus with diabetic neuropathy, unspecified: Secondary | ICD-10-CM | POA: Insufficient documentation

## 2015-10-29 DIAGNOSIS — S199XXA Unspecified injury of neck, initial encounter: Secondary | ICD-10-CM | POA: Diagnosis not present

## 2015-10-29 DIAGNOSIS — Y999 Unspecified external cause status: Secondary | ICD-10-CM | POA: Insufficient documentation

## 2015-10-29 DIAGNOSIS — I10 Essential (primary) hypertension: Secondary | ICD-10-CM | POA: Insufficient documentation

## 2015-10-29 DIAGNOSIS — Z87891 Personal history of nicotine dependence: Secondary | ICD-10-CM | POA: Insufficient documentation

## 2015-10-29 DIAGNOSIS — S66309A Unspecified injury of extensor muscle, fascia and tendon of unspecified finger at wrist and hand level, initial encounter: Secondary | ICD-10-CM | POA: Diagnosis not present

## 2015-10-29 DIAGNOSIS — S0990XA Unspecified injury of head, initial encounter: Secondary | ICD-10-CM | POA: Diagnosis not present

## 2015-10-29 DIAGNOSIS — S52611A Displaced fracture of right ulna styloid process, initial encounter for closed fracture: Secondary | ICD-10-CM | POA: Diagnosis not present

## 2015-10-29 DIAGNOSIS — Y939 Activity, unspecified: Secondary | ICD-10-CM | POA: Insufficient documentation

## 2015-10-29 DIAGNOSIS — S299XXA Unspecified injury of thorax, initial encounter: Secondary | ICD-10-CM | POA: Diagnosis not present

## 2015-10-29 DIAGNOSIS — S52501A Unspecified fracture of the lower end of right radius, initial encounter for closed fracture: Secondary | ICD-10-CM

## 2015-10-29 DIAGNOSIS — W1839XA Other fall on same level, initial encounter: Secondary | ICD-10-CM | POA: Diagnosis not present

## 2015-10-29 DIAGNOSIS — M25511 Pain in right shoulder: Secondary | ICD-10-CM | POA: Diagnosis not present

## 2015-10-29 DIAGNOSIS — W19XXXA Unspecified fall, initial encounter: Secondary | ICD-10-CM

## 2015-10-29 DIAGNOSIS — M79641 Pain in right hand: Secondary | ICD-10-CM | POA: Diagnosis not present

## 2015-10-29 LAB — CBC
HCT: 49.9 % (ref 39.0–52.0)
Hemoglobin: 16.2 g/dL (ref 13.0–17.0)
MCH: 29.7 pg (ref 26.0–34.0)
MCHC: 32.5 g/dL (ref 30.0–36.0)
MCV: 91.6 fL (ref 78.0–100.0)
PLATELETS: 273 10*3/uL (ref 150–400)
RBC: 5.45 MIL/uL (ref 4.22–5.81)
RDW: 14.7 % (ref 11.5–15.5)
WBC: 9.2 10*3/uL (ref 4.0–10.5)

## 2015-10-29 LAB — BASIC METABOLIC PANEL
ANION GAP: 7 (ref 5–15)
BUN: 10 mg/dL (ref 6–20)
CALCIUM: 9.2 mg/dL (ref 8.9–10.3)
CO2: 26 mmol/L (ref 22–32)
Chloride: 107 mmol/L (ref 101–111)
Creatinine, Ser: 1.42 mg/dL — ABNORMAL HIGH (ref 0.61–1.24)
GFR calc Af Amer: 58 mL/min — ABNORMAL LOW (ref >60)
GFR, EST NON AFRICAN AMERICAN: 50 mL/min — AB (ref >60)
GLUCOSE: 112 mg/dL — AB (ref 65–99)
Potassium: 3.6 mmol/L (ref 3.5–5.1)
SODIUM: 140 mmol/L (ref 135–145)

## 2015-10-29 LAB — PROTIME-INR
INR: 1.02
PROTHROMBIN TIME: 13.4 s (ref 11.4–15.2)

## 2015-10-29 LAB — I-STAT TROPONIN, ED: TROPONIN I, POC: 0 ng/mL (ref 0.00–0.08)

## 2015-10-29 MED ORDER — ACETAMINOPHEN 325 MG PO TABS
650.0000 mg | ORAL_TABLET | Freq: Once | ORAL | Status: AC
  Administered 2015-10-29: 650 mg via ORAL
  Filled 2015-10-29: qty 2

## 2015-10-29 MED ORDER — SODIUM CHLORIDE 0.9 % IV BOLUS (SEPSIS)
1000.0000 mL | Freq: Once | INTRAVENOUS | Status: AC
  Administered 2015-10-29: 1000 mL via INTRAVENOUS

## 2015-10-29 MED ORDER — IBUPROFEN 400 MG PO TABS
400.0000 mg | ORAL_TABLET | Freq: Once | ORAL | Status: AC
Start: 2015-10-29 — End: 2015-10-29
  Administered 2015-10-29: 400 mg via ORAL
  Filled 2015-10-29 (×2): qty 1

## 2015-10-29 NOTE — ED Notes (Signed)
Patient at Xray.

## 2015-10-29 NOTE — Discharge Instructions (Signed)
Please read and follow all provided instructions.  Your diagnoses today include:  1. Fall, initial encounter   2. Closed fracture of right distal radius, initial encounter   3. Dehydration    Tests performed today include: Vital signs. See below for your results today.   Medications prescribed:  Take as prescribed   Home care instructions:  Follow any educational materials contained in this packet.  Follow-up instructions: Please follow-up with Orthopedics for further evaluation of symptoms and treatment   Return instructions:  Please return to the Emergency Department if you do not get better, if you get worse, or new symptoms OR  - Fever (temperature greater than 101.27F)  - Bleeding that does not stop with holding pressure to the area    -Severe pain (please note that you may be more sore the day after your accident)  - Chest Pain  - Difficulty breathing  - Severe nausea or vomiting  - Inability to tolerate food and liquids  - Passing out  - Skin becoming red around your wounds  - Change in mental status (confusion or lethargy)  - New numbness or weakness    Please return if you have any other emergent concerns.  Additional Information:  Your vital signs today were: BP 117/91    Pulse 115    Temp 98.1 F (36.7 C) (Oral)    Resp 22    SpO2 91%  If your blood pressure (BP) was elevated above 135/85 this visit, please have this repeated by your doctor within one month. ---------------

## 2015-10-29 NOTE — ED Triage Notes (Addendum)
Patient came in by [facility]. Patient states he fell 3 times at home. EMS states patient has a wheelchair at home but does not use it. Denies LOC. Patient states he hit his head. No hematoma noted at this time. C/o right shoulder and wrist pain and chest pain with chronic lower back pain. EMS fsbs 111. EMS v/s were 118/78, 72 HR, 96 on RA, and 16 RR. Hx COPD, afib, stent placements, and fibromyalgia. A/ox4. GCS of 15. Patient does state he takes a blood thinner for afib.

## 2015-10-29 NOTE — ED Provider Notes (Signed)
[facility] DEPT Provider Note   CSN: [id number] Arrival date & time: 10/29/15  1250     History   Chief Complaint Chief Complaint  Patient presents with  . Fall    HPI Patrick Holt is a 65 y.o. male.  HPI  65 y.o. male with a hx of Atrial Fibrillation, DM2, HTN, presents to the Emergency Department today via EMS due to fall x 3 today. Pt with hx of falls. Seen in past for same. Falls today were mechanical in nature. Pt states that he ambulated from shower and fell backwards. Noted second occurrence when walking from chair. Uses scooter or cane for assistance. States he struck the back of his head without LOC. Notes pain on right wrist mainly with pain 10/10. Limited ROM due to pain. Notes CP as well x 1 week that has been consistent and without change. No N/V. No diaphoresis. No SOB/ABD pain. No visual changes. No headaches. No fevers. Notes back pain currently as well, but this is chronic. Pt states that his falls are due to his back pain. No other symptoms noted.   Past Medical History:  Diagnosis Date  . Anemia   . Anxiety   . Asthma   . Atrial fibrillation ([place]) 01/2012   with RVR  . Bipolar 1 disorder ([place])   . Cancer ([place])   . Collagen vascular disease ([place])   . Conversion disorder 06/2010  . COPD (chronic obstructive pulmonary disease) ([place])   . Dermatomyositis ([place])   . Dermatomyositis ([place])   . Diverticulitis   . Diverticulosis of colon   . Dysrhythmia    "irregular" (11/15/2012)  . Esophageal dysmotility   . Esophageal stricture   . Fibromyalgia   . Gastritis   . GERD (gastroesophageal reflux disease)   . Headache(784.0)    "severe; get them often" (11/15/2012)  . Hiatal hernia   . History of narcotic addiction ([place])   . Hx of adenomatous colonic polyps   . Hyperlipidemia   . Hypertension   . Internal hemorrhoids   . Ischemic heart disease   . Major depression ([place])    with acute psychotic break in 06/2010  . Myocardial infarct ([place])    mulitple  (1999, 2000, 2004)  . Narcotic dependence ([place])   . Nephrolithiasis   . Obesity   . OCD (obsessive compulsive disorder)   . Otosclerosis   . Paroxysmal a-fib ([place])   . Peripheral neuropathy ([place])   . Raynaud's disease   . Renal insufficiency   . Rheumatoid arthritis(714.0)   . Sarcoidosis ([employer])   . Seizures ([place])   . Subarachnoid hemorrhage ([place]) 01/2012   with subdural  hematoma.   . Type II diabetes mellitus ([place])   . Urge incontinence   . Vertigo     Patient Active Problem List   Diagnosis Date Noted  . Anxiety 06/06/2015  . Impaired mobility 04/28/2015  . Lumbar spondylosis with myelopathy 03/07/2015  . Nocturia 12/03/2014  . Chronic venous insufficiency 09/23/2014  . Poor social situation 07/30/2014  . Schizophrenia, acute ([place])   . Schizoaffective disorder, unspecified type ([place])   . Decreased visual acuity 06/27/2014  . Right sided weakness 01/04/2014  . Essential hypertension, benign   . Atrial fibrillation, unspecified   . Radiculopathy of cervical region   . Polyuria 12/07/2013  . Erectile dysfunction 12/07/2013  . Diverticulitis 08/01/2013  . Acute diverticulitis 08/01/2013  . Hematochezia 06/04/2013  . Hypokalemia 10/03/2012  . Benign neoplasm of colon 09/27/2012  . Stricture  and stenosis of esophagus 09/27/2012  . Diverticulitis of colon without hemorrhage 07/26/2012  . Polypharmacy 03/26/2012  . Seizures ([place]) 02/10/2012  . Atrial fibrillation ([place]) 02/05/2012  . Coronary atherosclerosis of native coronary artery 02/05/2012  . Radicular low back pain 03/09/2011  . History of seizure disorder 01/29/2011  . Irritable bowel syndrome (IBS) 06/24/2010  . Urge incontinence 06/19/2010  . TOBACCO USER 11/23/2008  . DEPRESSION, MAJOR 05/31/2008  . ISCHEMIC HEART DISEASE 05/31/2008  . RAYNAUD'S DISEASE 05/31/2008  . HIATAL HERNIA 05/31/2008  . Rheumatoid arthritis ([place]) 05/31/2008  . FIBROMYALGIA 05/31/2008  . Hyperlipidemia 01/26/2008  . Anxiety state  01/26/2008  . HEMORRHOIDS, INTERNAL 01/26/2008  . COPD (chronic obstructive pulmonary disease) ([place]) 01/26/2008  . ESOPHAGEAL MOTILITY DISORDER 01/26/2008  . GERD 01/26/2008  . Dermatomyositis ([place]) 01/26/2008    Past Surgical History:  Procedure Laterality Date  . BACK SURGERY    . CARDIAC CATHETERIZATION    . CARPAL TUNNEL RELEASE Bilateral   . CATARACT EXTRACTION W/ INTRAOCULAR LENS IMPLANT Left   . COLONOSCOPY N/A 09/27/2012   Procedure: COLONOSCOPY;  Surgeon: Lafayette Dragon, MD;  Location: [facility] ENDOSCOPY;  Service: Endoscopy;  Laterality: N/A;  . ESOPHAGOGASTRODUODENOSCOPY N/A 09/27/2012   Procedure: ESOPHAGOGASTRODUODENOSCOPY (EGD);  Surgeon: Lafayette Dragon, MD;  Location: [facility];  Service: Endoscopy;  Laterality: N/A;  . KNEE ARTHROSCOPY W/ MENISCAL REPAIR Left 2009  . LUMBAR DISC SURGERY    . LYMPH NODE DISSECTION Right 1970's   "neck; dr thought I had Hodgkins; turned out to be sarcoidosis" (11/15/2012)  . squamous papilloma   2010   removed by Dr. Constance Holster ENT, noted on tongue  . TONSILLECTOMY       Home Medications    Prior to Admission medications   Medication Sig Start Date End Date Taking? Authorizing Provider  ALPRAZolam Duanne Moron) 0.5 MG tablet Take 1 tablet (0.5 mg total) by mouth 3 (three) times daily as needed for anxiety. 10/22/15   [name], MD  amiodarone (PACERONE) 200 MG tablet Take 0.5 tablets (100 mg total) by mouth daily. 10/17/15   [name], MD  amLODipine (NORVASC) 10 MG tablet Take 1 tablet (10 mg total) by mouth daily. 10/17/15   [name], MD  aspirin EC 81 MG tablet Take 1 tablet (81 mg total) by mouth daily. 10/17/15   [name], MD  atorvastatin (LIPITOR) 40 MG tablet Take 1 tablet (40 mg total) by mouth daily. 10/17/15   [name], MD  divalproex (DEPAKOTE ER) 250 MG 24 hr tablet Take 2 tablets (500 mg total) by mouth daily. 10/17/15   [name], MD  esomeprazole (NEXIUM) 40 MG capsule Take 1 capsule (40 mg total) by mouth daily at 12 noon.  10/17/15   [name], MD  guaiFENesin (MUCINEX) 600 MG 12 hr tablet Take 1 tablet (600 mg total) by mouth 2 (two) times daily. OTC 10/17/15   [name], MD  levETIRAcetam (KEPPRA) 500 MG tablet TAKE 1 TABLET(500 MG) BY MOUTH TWICE DAILY 10/17/15   [name], MD  loperamide (IMODIUM) 2 MG capsule TAKE 1 TO 2 CAPSULES BY MOUTH 3 TIMES A DAY AS NEEDED FOR DIARRHEA 08/20/15   [name], MD  meclizine (ANTIVERT) 25 MG tablet TAKE 1 TABLET (25 MG TOTAL) BY MOUTH 3 (THREE) TIMES DAILY AS NEEDED. 10/17/15   [name], MD  Multiple Vitamin (MULTIVITAMIN WITH MINERALS) TABS tablet Take 1 tablet by mouth daily. 10/17/15  [name], MD  naproxen (NAPROSYN) 500 MG tablet Take 1 tablet (500 mg total) by mouth 2 (two) times daily with a meal. 10/17/15   [name], MD  oxybutynin (DITROPAN) 5 MG tablet Take 1 tablet (5 mg total) by mouth 4 (four) times daily. 10/17/15   [name], MD  Potassium Chloride ER 20 MEQ TBCR Take 1 tablet by mouth every other day.  11/29/14   Historical Provider, MD    Family History Family History  Problem Relation Age of Onset  . Alcohol abuse Mother   . Depression Mother   . Heart disease Mother   . Diabetes Mother   . Stroke Mother   . Coronary artery disease Father   . Diabetes Other     1/2 brother  . Hepatitis Brother     Social History Social History  Substance Use Topics  . Smoking status: Former Smoker    Packs/day: 0.50    Years: 30.00    Types: Cigarettes    Quit date: 12/15/2014  . Smokeless tobacco: Never Used     Comment: 3 cigs a day  . Alcohol use No     Comment: Alcohol stopped in September of 2014   Allergies   Immune globulins; Ciprofloxacin; Flagyl [metronidazole]; Lisinopril; Sulfa antibiotics; Requip [ropinirole hcl]; Betamethasone dipropionate; Bupropion hcl; Clobetasol; Codeine; Escitalopram oxalate; Fluoxetine hcl; Fluticasone-salmeterol; Furosemide; Paroxetine; Penicillins; Tacrolimus; Tetanus toxoid; and Tuberculin purified  protein derivative   Review of Systems Review of Systems ROS reviewed and all are negative for acute change except as noted in the HPI.  Physical Exam Updated Vital Signs BP 102/89 (BP Location: Left Arm)   Pulse 111   Temp 98.1 F (36.7 C) (Oral)   Resp 14   SpO2 (!) 87% Comment: COPD pt, PA & RN aware   Physical Exam  Constitutional: He is oriented to person, place, and time. Vital signs are normal. He appears well-developed and well-nourished.  HENT:  Head: Normocephalic.  Right Ear: Hearing normal.  Left Ear: Hearing normal.  Eyes: Conjunctivae and EOM are normal. Pupils are equal, round, and reactive to light.  Neck: Normal range of motion. Neck supple.  Cardiovascular: Normal heart sounds and intact distal pulses.  An irregularly irregular rhythm present.  Pulmonary/Chest: Effort normal and breath sounds normal. No respiratory distress. He has no rales. He exhibits no tenderness.  Abdominal: Soft.  Musculoskeletal:       Right wrist: He exhibits decreased range of motion and tenderness. He exhibits no swelling and no deformity.  RUE. NVI. Distal pulses intact. Motor/sensation intact.  Neurological: He is alert and oriented to person, place, and time. He has normal strength. No cranial nerve deficit or sensory deficit.  Cranial Nerves:  II: Pupils equal, round, reactive to light III,IV, VI: ptosis not present, extra-ocular motions intact bilaterally  V,VII: smile symmetric, facial light touch sensation equal VIII: hearing grossly normal bilaterally  IX,X: midline uvula rise  XI: bilateral shoulder shrug equal and strong XII: midline tongue extension  Skin: Skin is warm and dry.  Psychiatric: He has a normal mood and affect. His behavior is normal. Thought content normal. His speech is tangential.  Noted tangential requiring persistent redirecting  Nursing note and vitals reviewed.  ED Treatments / Results  Labs (all labs ordered are listed, but only abnormal  results are displayed) Labs Reviewed  BASIC METABOLIC PANEL - Abnormal; Notable for the following:       Result Value   Glucose, Bld 112 (*)  Creatinine, Ser 1.42 (*)    GFR calc non Af Amer 50 (*)    GFR calc Af Amer 58 (*)    All other components within normal limits  CBC  PROTIME-INR  I-STAT TROPOININ, ED    EKG  EKG Interpretation None       Radiology Dg Chest 2 View  Result Date: 10/29/2015 CLINICAL DATA:  Status post fall x3 today. EXAM: CHEST  2 VIEW COMPARISON:  PA and lateral chest 10/16/2012, 12/30/2012 07/31/2014 and 12/30/2014. CT chest 07/31/2014. FINDINGS: Asymmetric elevation of the right hemidiaphragm is seen on prior exams. The lungs are clear. Heart size is upper normal. No pneumothorax or pleural effusion. Aortic atherosclerosis noted. IMPRESSION: No acute disease. Electronically Signed   By: Inge Rise M.D.   On: 10/29/2015 14:56   Dg Wrist Complete Right  Result Date: 10/29/2015 CLINICAL DATA:  65 year old male status post multiple falls at home. Pain. Initial encounter. EXAM: RIGHT WRIST - COMPLETE 3+ VIEW COMPARISON:  None. FINDINGS: Comminuted and dorsally impacted fracture of the distal right radius with radiocarpal joint and DRU involvement. Comminuted mildly displaced fracture of the ulnar styloid. Surrounding soft tissue swelling. Mild widening of the scapholunate interval, but otherwise carpal bone alignment appears preserved. Visible metacarpals appear intact, there is foreshortening of the fifth metacarpal. Calcified peripheral vascular disease. IMPRESSION: 1. Comminuted and dorsally impacted distal right radius fracture with radiocarpal and probably also DRU involvement. 2. Comminuted mildly displaced fracture of the right ulnar styloid. 3. Widening of the scapholunate interval suspicious for scapholunate ligament injury. Electronically Signed   By: Genevie Ann M.D.   On: 10/29/2015 14:56   Ct Head Wo Contrast  Result Date: 10/29/2015 CLINICAL DATA:   Status post fall x3 at home today. The patient reports a blow to the head. Initial encounter. EXAM: CT HEAD WITHOUT CONTRAST CT CERVICAL SPINE WITHOUT CONTRAST TECHNIQUE: Multidetector CT imaging of the head and cervical spine was performed following the standard protocol without intravenous contrast. Multiplanar CT image reconstructions of the cervical spine were also generated. COMPARISON:  Head and cervical spine CT scan 06/26/2014. FINDINGS: CT HEAD FINDINGS Brain: Cortical atrophy and chronic microvascular ischemic change are identified. No evidence of acute abnormality including hemorrhage, infarct, mass lesion, mass effect, midline shift or abnormal extra-axial fluid collection is identified. No hydrocephalus or pneumocephalus. Vascular: Extensive atherosclerosis is seen. Skull: Intact. Sinuses/Orbits: Unremarkable. Other: None. CT CERVICAL SPINE FINDINGS Alignment: Maintained.  Straightening of lordosis is unchanged. Skull base and vertebrae: Unremarkable. Soft tissues and spinal canal: Unremarkable. Disc levels: Intervertebral disc space height is maintained. Degenerative change at the C1-2 articulation is noted. Upper chest: Emphysematous change in the apices is noted. Other: None. IMPRESSION: No acute abnormality head or cervical spine. Atherosclerosis. Atrophy and chronic microvascular ischemic change. Emphysema. Electronically Signed   By: Inge Rise M.D.   On: 10/29/2015 14:29   Ct Cervical Spine Wo Contrast  Result Date: 10/29/2015 CLINICAL DATA:  Status post fall x3 at home today. The patient reports a blow to the head. Initial encounter. EXAM: CT HEAD WITHOUT CONTRAST CT CERVICAL SPINE WITHOUT CONTRAST TECHNIQUE: Multidetector CT imaging of the head and cervical spine was performed following the standard protocol without intravenous contrast. Multiplanar CT image reconstructions of the cervical spine were also generated. COMPARISON:  Head and cervical spine CT scan 06/26/2014. FINDINGS:  CT HEAD FINDINGS Brain: Cortical atrophy and chronic microvascular ischemic change are identified. No evidence of acute abnormality including hemorrhage, infarct, mass lesion, mass effect, midline shift or  abnormal extra-axial fluid collection is identified. No hydrocephalus or pneumocephalus. Vascular: Extensive atherosclerosis is seen. Skull: Intact. Sinuses/Orbits: Unremarkable. Other: None. CT CERVICAL SPINE FINDINGS Alignment: Maintained.  Straightening of lordosis is unchanged. Skull base and vertebrae: Unremarkable. Soft tissues and spinal canal: Unremarkable. Disc levels: Intervertebral disc space height is maintained. Degenerative change at the C1-2 articulation is noted. Upper chest: Emphysematous change in the apices is noted. Other: None. IMPRESSION: No acute abnormality head or cervical spine. Atherosclerosis. Atrophy and chronic microvascular ischemic change. Emphysema. Electronically Signed   By: Inge Rise M.D.   On: 10/29/2015 14:29   Dg Hand Complete Right  Result Date: 10/29/2015 CLINICAL DATA:  65 year old male status post multiple falls at home. Pain. Initial encounter. EXAM: RIGHT HAND - COMPLETE 3+ VIEW COMPARISON:  Right wrist series from today reported separately. FINDINGS: See right wrist series from today reported separately regarding distal radius, ulna, and carpal bone injuries. Foreshortening of the right fifth metacarpal where there may be an underlying healed chronic fracture. No acute metacarpal fracture. No acute phalanx fracture. Distal joint spaces are within normal limits for age. IMPRESSION: 1. See right wrist series from today reported separately. 2. No superimposed acute fracture or dislocation identified in the right hand. Electronically Signed   By: Genevie Ann M.D.   On: 10/29/2015 14:57   Procedures Procedures (including critical care time)  Medications Ordered in ED Medications  ibuprofen (ADVIL,MOTRIN) tablet 400 mg (400 mg Oral Not Given 10/29/15 1507)    acetaminophen (TYLENOL) tablet 650 mg (650 mg Oral Given 10/29/15 1507)  sodium chloride 0.9 % bolus 1,000 mL (1,000 mLs Intravenous New Bag/Given 10/29/15 1522)   Initial Impression / Assessment and Plan / ED Course  I have reviewed the triage vital signs and the nursing notes.  Pertinent labs & imaging results that were available during my care of the patient were reviewed by me and considered in my medical decision making (see chart for details).  Clinical Course    Final Clinical Impressions(s) / ED Diagnoses  I have reviewed and evaluated the relevant laboratory values I have reviewed and evaluated the relevant imaging studies.  I have interpreted the relevant EKG. I have reviewed the relevant previous healthcare records. I have reviewed EMS Documentation. I obtained HPI from historian. Patient discussed with supervising physician  ED Course:  Assessment: Pt is a 67yM with hx Atrial Fibrillation, DM2, HTN who presents with via EMS due to mechanical fall x 3 today. Hx falls in past. Noted head trauma without LOC. Pt with Afib and unsure of blood thinners. On exam, pt in NAD. Nontoxic/nonseptic appearing. VSS. Afebrile. Lungs CTA. Heart RRR. Abdomen nontender soft. Right wrist TTP. Limited ROM due to pain. CN evaluated and unremarkable. Motor/sensation intact. Tangential during questioning requiring redirecting questions. Appears baseline for patient. Labs show mild AKI likely from dehydration.CT Head/Neck unremarkable CXR unremarkable. DG Right Wrist shows distal radius fracture with mild displacement. Placed in sugar tong splint. Falls likely attributed to dehydration vs benzo abuse. Pt endorses 1 glass of water per day. Tachycardia likely related to dehydration and pain. Given fluids and analgesia. Noted on recent Cardiology visit in April (Dr. Dorris Carnes) that pt has PAF and is not a coumadin candidate. Previous documentation of multiple falls could possibly linked to benzodiazepine abuse as  noted by PCP. Suspect fall associated to benzo abuse. Pt notes taking this morning. Plan is to DC home with follow up to Orthopedics. Will DC after splint application and ambulation of patient.  Pt with recent Alprazolam Rx on 10-22-15 with #90  Disposition/Plan:  DC Home Additional Verbal discharge instructions given and discussed with patient.  Pt Instructed to f/u with Orthopedics in the next week for evaluation and treatment of symptoms. Return precautions given Pt acknowledges and agrees with plan  Supervising Physician Forde Dandy, MD   Final diagnoses:  Fall, initial encounter  Closed fracture of right distal radius, initial encounter  Dehydration    New Prescriptions New Prescriptions   No medications on file       Shary Decamp, PA-C 10/29/15 [place] Liu, MD 10/29/15 [id number]

## 2015-10-29 NOTE — ED Notes (Signed)
Per EMS upon pt arrival, pt is supposed to be wheelchair bound. However, pt does have cane and has fallen x3 today. Pt called out and this NT and Martie Round, RN found pt standing beside bed. Informed pt to remain in bed and to not get up without assistance due to hx of recent falls. Pt agreed and was placed back into bed by this NT.

## 2015-10-29 NOTE — ED Notes (Signed)
Pt able to stand at bedside with cane. Pt. Reports unable to ambulate, uses wheelchair to navigate at home. Md made aware. Will call [facility] for transport.

## 2015-10-29 NOTE — ED Notes (Signed)
Paged ortho tech 

## 2015-10-29 NOTE — ED Notes (Signed)
Pt called out to use urinal. This NT entered room to pt sitting in chair. Pt admitted to getting out of bed from the end of the bed. Both side rails up. Pt now back into bed via this NT and Amy, EMT.

## 2015-10-29 NOTE — ED Provider Notes (Signed)
I was asked to click discharge for patient Pt has had splint placed He can stand at baseline He has no complaints No SOB reported He is requesting discharge   EKG Interpretation  Date/Time:  Wednesday October 29 2015 12:59:35 EDT Ventricular Rate:  109 PR Interval:    QRS Duration: 85 QT Interval:  288 QTC Calculation: 379 R Axis:   -5 Text Interpretation:  Atrial fibrillation Low voltage, precordial leads Borderline repolarization abnormality Baseline wander in lead(s) V3 Confirmed by Christy Gentles  MD, Jadia Capers ([id number]) on 10/29/2015 4:21:42 PM         Ripley Fraise, MD 10/29/15 1700

## 2015-10-29 NOTE — Progress Notes (Signed)
Orthopedic Tech Progress Note Patient Details:  Patrick Holt 1950/10/23 [id number]  Ortho Devices Type of Ortho Device: Ace wrap, Arm sling, Sugartong splint Ortho Device/Splint Location: RUE Ortho Device/Splint Interventions: Ordered, Application   Braulio Bosch 10/29/2015, 3:55 PM

## 2015-11-02 ENCOUNTER — Observation Stay (HOSPITAL_COMMUNITY)
Admission: EM | Admit: 2015-11-02 | Discharge: 2015-11-05 | Disposition: A | Discharge status: Skilled Nursing Facility | Payer: Medicare Other | Attending: Family Medicine | Admitting: Family Medicine

## 2015-11-02 ENCOUNTER — Emergency Department (HOSPITAL_COMMUNITY): Payer: Medicare Other

## 2015-11-02 ENCOUNTER — Encounter (HOSPITAL_COMMUNITY): Payer: Self-pay | Admitting: *Deleted

## 2015-11-02 DIAGNOSIS — E669 Obesity, unspecified: Secondary | ICD-10-CM | POA: Insufficient documentation

## 2015-11-02 DIAGNOSIS — F419 Anxiety disorder, unspecified: Secondary | ICD-10-CM | POA: Insufficient documentation

## 2015-11-02 DIAGNOSIS — G20 Parkinson's disease: Secondary | ICD-10-CM | POA: Diagnosis not present

## 2015-11-02 DIAGNOSIS — R0902 Hypoxemia: Secondary | ICD-10-CM | POA: Diagnosis not present

## 2015-11-02 DIAGNOSIS — Z87891 Personal history of nicotine dependence: Secondary | ICD-10-CM | POA: Diagnosis not present

## 2015-11-02 DIAGNOSIS — Z6833 Body mass index (BMI) 33.0-33.9, adult: Secondary | ICD-10-CM | POA: Insufficient documentation

## 2015-11-02 DIAGNOSIS — I10 Essential (primary) hypertension: Secondary | ICD-10-CM | POA: Insufficient documentation

## 2015-11-02 DIAGNOSIS — S62101A Fracture of unspecified carpal bone, right wrist, initial encounter for closed fracture: Secondary | ICD-10-CM

## 2015-11-02 DIAGNOSIS — S2241XA Multiple fractures of ribs, right side, initial encounter for closed fracture: Secondary | ICD-10-CM | POA: Diagnosis not present

## 2015-11-02 DIAGNOSIS — R05 Cough: Secondary | ICD-10-CM | POA: Diagnosis not present

## 2015-11-02 DIAGNOSIS — W19XXXA Unspecified fall, initial encounter: Secondary | ICD-10-CM | POA: Diagnosis not present

## 2015-11-02 DIAGNOSIS — N3941 Urge incontinence: Secondary | ICD-10-CM | POA: Diagnosis not present

## 2015-11-02 DIAGNOSIS — G40909 Epilepsy, unspecified, not intractable, without status epilepticus: Secondary | ICD-10-CM | POA: Insufficient documentation

## 2015-11-02 DIAGNOSIS — S52351A Displaced comminuted fracture of shaft of radius, right arm, initial encounter for closed fracture: Secondary | ICD-10-CM | POA: Insufficient documentation

## 2015-11-02 DIAGNOSIS — I7300 Raynaud's syndrome without gangrene: Secondary | ICD-10-CM | POA: Insufficient documentation

## 2015-11-02 DIAGNOSIS — Z7982 Long term (current) use of aspirin: Secondary | ICD-10-CM | POA: Insufficient documentation

## 2015-11-02 DIAGNOSIS — J449 Chronic obstructive pulmonary disease, unspecified: Secondary | ICD-10-CM | POA: Diagnosis not present

## 2015-11-02 DIAGNOSIS — I480 Paroxysmal atrial fibrillation: Secondary | ICD-10-CM | POA: Diagnosis not present

## 2015-11-02 DIAGNOSIS — F209 Schizophrenia, unspecified: Secondary | ICD-10-CM | POA: Insufficient documentation

## 2015-11-02 DIAGNOSIS — I252 Old myocardial infarction: Secondary | ICD-10-CM | POA: Diagnosis not present

## 2015-11-02 DIAGNOSIS — S6990XA Unspecified injury of unspecified wrist, hand and finger(s), initial encounter: Secondary | ICD-10-CM

## 2015-11-02 DIAGNOSIS — K529 Noninfective gastroenteritis and colitis, unspecified: Secondary | ICD-10-CM | POA: Diagnosis not present

## 2015-11-02 DIAGNOSIS — I2510 Atherosclerotic heart disease of native coronary artery without angina pectoris: Secondary | ICD-10-CM | POA: Diagnosis not present

## 2015-11-02 DIAGNOSIS — R0781 Pleurodynia: Secondary | ICD-10-CM | POA: Diagnosis not present

## 2015-11-02 DIAGNOSIS — Z79899 Other long term (current) drug therapy: Secondary | ICD-10-CM | POA: Insufficient documentation

## 2015-11-02 DIAGNOSIS — E785 Hyperlipidemia, unspecified: Secondary | ICD-10-CM | POA: Insufficient documentation

## 2015-11-02 DIAGNOSIS — S279XXA Injury of unspecified intrathoracic organ, initial encounter: Secondary | ICD-10-CM | POA: Diagnosis not present

## 2015-11-02 DIAGNOSIS — D869 Sarcoidosis, unspecified: Secondary | ICD-10-CM | POA: Insufficient documentation

## 2015-11-02 DIAGNOSIS — S52613D Displaced fracture of unspecified ulna styloid process, subsequent encounter for closed fracture with routine healing: Secondary | ICD-10-CM

## 2015-11-02 DIAGNOSIS — R262 Difficulty in walking, not elsewhere classified: Secondary | ICD-10-CM

## 2015-11-02 DIAGNOSIS — S52124D Nondisplaced fracture of head of right radius, subsequent encounter for closed fracture with routine healing: Secondary | ICD-10-CM | POA: Diagnosis not present

## 2015-11-02 DIAGNOSIS — F319 Bipolar disorder, unspecified: Secondary | ICD-10-CM | POA: Insufficient documentation

## 2015-11-02 DIAGNOSIS — R296 Repeated falls: Secondary | ICD-10-CM | POA: Insufficient documentation

## 2015-11-02 DIAGNOSIS — J189 Pneumonia, unspecified organism: Secondary | ICD-10-CM | POA: Diagnosis not present

## 2015-11-02 DIAGNOSIS — K219 Gastro-esophageal reflux disease without esophagitis: Secondary | ICD-10-CM | POA: Insufficient documentation

## 2015-11-02 DIAGNOSIS — M797 Fibromyalgia: Secondary | ICD-10-CM | POA: Insufficient documentation

## 2015-11-02 DIAGNOSIS — M069 Rheumatoid arthritis, unspecified: Secondary | ICD-10-CM | POA: Insufficient documentation

## 2015-11-02 DIAGNOSIS — S52611A Displaced fracture of right ulna styloid process, initial encounter for closed fracture: Secondary | ICD-10-CM | POA: Diagnosis not present

## 2015-11-02 DIAGNOSIS — E1140 Type 2 diabetes mellitus with diabetic neuropathy, unspecified: Secondary | ICD-10-CM | POA: Insufficient documentation

## 2015-11-02 LAB — CBC WITH DIFFERENTIAL/PLATELET
BASOS PCT: 0 %
Basophils Absolute: 0 10*3/uL (ref 0.0–0.1)
EOS PCT: 1 %
Eosinophils Absolute: 0.1 10*3/uL (ref 0.0–0.7)
HEMATOCRIT: 47.5 % (ref 39.0–52.0)
Hemoglobin: 15.6 g/dL (ref 13.0–17.0)
Lymphocytes Relative: 18 %
Lymphs Abs: 1.6 10*3/uL (ref 0.7–4.0)
MCH: 29.5 pg (ref 26.0–34.0)
MCHC: 32.8 g/dL (ref 30.0–36.0)
MCV: 90 fL (ref 78.0–100.0)
MONO ABS: 1 10*3/uL (ref 0.1–1.0)
MONOS PCT: 11 %
Neutro Abs: 6.3 10*3/uL (ref 1.7–7.7)
Neutrophils Relative %: 70 %
Platelets: 269 10*3/uL (ref 150–400)
RBC: 5.28 MIL/uL (ref 4.22–5.81)
RDW: 15.4 % (ref 11.5–15.5)
WBC: 9 10*3/uL (ref 4.0–10.5)

## 2015-11-02 LAB — COMPREHENSIVE METABOLIC PANEL
ALBUMIN: 3.5 g/dL (ref 3.5–5.0)
ALT: 20 U/L (ref 17–63)
ANION GAP: 7 (ref 5–15)
AST: 32 U/L (ref 15–41)
Alkaline Phosphatase: 75 U/L (ref 38–126)
BILIRUBIN TOTAL: 1.2 mg/dL (ref 0.3–1.2)
BUN: 18 mg/dL (ref 6–20)
CO2: 28 mmol/L (ref 22–32)
Calcium: 8.7 mg/dL — ABNORMAL LOW (ref 8.9–10.3)
Chloride: 108 mmol/L (ref 101–111)
Creatinine, Ser: 1.04 mg/dL (ref 0.61–1.24)
GFR calc Af Amer: >60 mL/min (ref >60)
Glucose, Bld: 87 mg/dL (ref 65–99)
POTASSIUM: 3.4 mmol/L — AB (ref 3.5–5.1)
Sodium: 143 mmol/L (ref 135–145)
TOTAL PROTEIN: 7.4 g/dL (ref 6.5–8.1)

## 2015-11-02 MED ORDER — DEXTROSE 5 % IV SOLN: 1.0000 g | INTRAVENOUS | Status: DC

## 2015-11-02 MED ORDER — POTASSIUM CHLORIDE CRYS ER 20 MEQ PO TBCR
40.0000 meq | EXTENDED_RELEASE_TABLET | Freq: Once | ORAL | Status: AC
  Administered 2015-11-03: 40 meq via ORAL
  Filled 2015-11-02: qty 2

## 2015-11-02 MED ORDER — DEXTROSE 5 % IV SOLN: 500.0000 mg | INTRAVENOUS | Status: DC

## 2015-11-02 MED ORDER — DEXTROSE 5 % IV SOLN
500.0000 mg | Freq: Once | INTRAVENOUS | Status: AC
  Administered 2015-11-02: 500 mg via INTRAVENOUS
  Filled 2015-11-02: qty 500

## 2015-11-02 MED ORDER — DEXTROSE 5 % IV SOLN
1.0000 g | Freq: Once | INTRAVENOUS | Status: AC
  Administered 2015-11-02: 1 g via INTRAVENOUS
  Filled 2015-11-02: qty 10

## 2015-11-02 MED ORDER — SODIUM CHLORIDE 0.9 % IV BOLUS (SEPSIS): 500.0000 mL | Freq: Once | INTRAVENOUS | Status: DC

## 2015-11-02 NOTE — ED Provider Notes (Signed)
[facility] DEPT Provider Note   CSN: [id number] Arrival date & time: 11/02/15  1447     History   Chief Complaint No chief complaint on file.   HPI Patrick Holt is a 65 y.o. male.  Patrick Holt is a 65 y.o. male with history of atrial fibrillation not on anticoagulation, anemia, anxiety, bipolar COPD, fibromyalgia, GERD, HTN, HLD, CAD, seizure disorder, peripheral neuropathy, raynaud's, RA, T2DM, sarcoidosis presents to ED with complaint of right rib pain. Difficult to ascertain history from patient, not a reliable historian - tangential thinking, requiring multiple redirection. Patient reports right sided rib pain with radiation to sternum and back. Pain is worse with movement of right arm, cough, and inspiration. Patient has associated productive cough with white phlegm. He denies difficulty breathing, wheezing, chest pain, fever, abdominal pain, nausea, vomiting, dysuria, hematuria. Patient has a history of afib not on anti-coagulation. He denies any recent long distance travel/surgery/hospitalization, h/o blood clot, lower leg swelling/pain, hemoptysis, h/o cancer.       Past Medical History:  Diagnosis Date  . Anemia   . Anxiety   . Asthma   . Atrial fibrillation ([place]) 01/2012   with RVR  . Bipolar 1 disorder ([place])   . Cancer ([place])   . Collagen vascular disease ([place])   . Conversion disorder 06/2010  . COPD (chronic obstructive pulmonary disease) ([place])   . Dermatomyositis ([place])   . Dermatomyositis ([place])   . Diverticulitis   . Diverticulosis of colon   . Dysrhythmia    "irregular" (11/15/2012)  . Esophageal dysmotility   . Esophageal stricture   . Fibromyalgia   . Gastritis   . GERD (gastroesophageal reflux disease)   . Headache(784.0)    "severe; get them often" (11/15/2012)  . Hiatal hernia   . History of narcotic addiction ([place])   . Hx of adenomatous colonic polyps   . Hyperlipidemia   . Hypertension   . Internal hemorrhoids   . Ischemic heart  disease   . Major depression ([place])    with acute psychotic break in 06/2010  . Myocardial infarct ([place])    mulitple (1999, 2000, 2004)  . Narcotic dependence ([place])   . Nephrolithiasis   . Obesity   . OCD (obsessive compulsive disorder)   . Otosclerosis   . Paroxysmal a-fib ([place])   . Peripheral neuropathy ([place])   . Raynaud's disease   . Renal insufficiency   . Rheumatoid arthritis(714.0)   . Sarcoidosis ([place])   . Seizures ([place])   . Subarachnoid hemorrhage ([place]) 01/2012   with subdural  hematoma.   . Type II diabetes mellitus ([place])   . Urge incontinence   . Vertigo     Patient Active Problem List   Diagnosis Date Noted  . Anxiety 06/06/2015  . Impaired mobility 04/28/2015  . Lumbar spondylosis with myelopathy 03/07/2015  . Nocturia 12/03/2014  . Chronic venous insufficiency 09/23/2014  . Poor social situation 07/30/2014  . Schizophrenia, acute ([place])   . Schizoaffective disorder, unspecified type ([place])   . Decreased visual acuity 06/27/2014  . Right sided weakness 01/04/2014  . Essential hypertension, benign   . Atrial fibrillation, unspecified   . Radiculopathy of cervical region   . Polyuria 12/07/2013  . Erectile dysfunction 12/07/2013  . Diverticulitis 08/01/2013  . Acute diverticulitis 08/01/2013  . Hematochezia 06/04/2013  . Hypokalemia 10/03/2012  . Benign neoplasm of colon 09/27/2012  . Stricture and stenosis of esophagus 09/27/2012  . Diverticulitis of colon without hemorrhage 07/26/2012  . Polypharmacy  03/26/2012  . Seizures ([place]) 02/10/2012  . Atrial fibrillation ([place]) 02/05/2012  . Coronary atherosclerosis of native coronary artery 02/05/2012  . Radicular low back pain 03/09/2011  . History of seizure disorder 01/29/2011  . Irritable bowel syndrome (IBS) 06/24/2010  . Urge incontinence 06/19/2010  . TOBACCO USER 11/23/2008  . DEPRESSION, MAJOR 05/31/2008  . ISCHEMIC HEART DISEASE 05/31/2008  . RAYNAUD'S DISEASE 05/31/2008  . HIATAL HERNIA 05/31/2008   . Rheumatoid arthritis ([place]) 05/31/2008  . FIBROMYALGIA 05/31/2008  . Hyperlipidemia 01/26/2008  . Anxiety state 01/26/2008  . HEMORRHOIDS, INTERNAL 01/26/2008  . COPD (chronic obstructive pulmonary disease) ([place]) 01/26/2008  . ESOPHAGEAL MOTILITY DISORDER 01/26/2008  . GERD 01/26/2008  . Dermatomyositis ([place]) 01/26/2008    Past Surgical History:  Procedure Laterality Date  . BACK SURGERY    . CARDIAC CATHETERIZATION    . CARPAL TUNNEL RELEASE Bilateral   . CATARACT EXTRACTION W/ INTRAOCULAR LENS IMPLANT Left   . COLONOSCOPY N/A 09/27/2012   Procedure: COLONOSCOPY;  Surgeon: Lafayette Dragon, MD;  Location: [facility] ENDOSCOPY;  Service: Endoscopy;  Laterality: N/A;  . ESOPHAGOGASTRODUODENOSCOPY N/A 09/27/2012   Procedure: ESOPHAGOGASTRODUODENOSCOPY (EGD);  Surgeon: Lafayette Dragon, MD;  Location: [facility];  Service: Endoscopy;  Laterality: N/A;  . KNEE ARTHROSCOPY W/ MENISCAL REPAIR Left 2009  . LUMBAR DISC SURGERY    . LYMPH NODE DISSECTION Right 1970's   "neck; dr thought I had Hodgkins; turned out to be sarcoidosis" (11/15/2012)  . squamous papilloma   2010   removed by Dr. Constance Holster ENT, noted on tongue  . TONSILLECTOMY         Home Medications    Prior to Admission medications   Medication Sig Start Date End Date Taking? Authorizing Provider  ALPRAZolam Duanne Moron) 0.5 MG tablet Take 1 tablet (0.5 mg total) by mouth 3 (three) times daily as needed for anxiety. 10/22/15   [name], MD  amiodarone (PACERONE) 200 MG tablet Take 0.5 tablets (100 mg total) by mouth daily. 10/17/15   [name], MD  amLODipine (NORVASC) 10 MG tablet Take 1 tablet (10 mg total) by mouth daily. 10/17/15   [name], MD  aspirin EC 81 MG tablet Take 1 tablet (81 mg total) by mouth daily. 10/17/15   [name], MD  atorvastatin (LIPITOR) 40 MG tablet Take 1 tablet (40 mg total) by mouth daily. 10/17/15   [name], MD  divalproex (DEPAKOTE ER) 250 MG 24 hr tablet Take 2 tablets (500 mg total) by mouth daily.  10/17/15   [name], MD  esomeprazole (NEXIUM) 40 MG capsule Take 1 capsule (40 mg total) by mouth daily at 12 noon. 10/17/15   [name], MD  guaiFENesin (MUCINEX) 600 MG 12 hr tablet Take 1 tablet (600 mg total) by mouth 2 (two) times daily. OTC 10/17/15   [name], MD  levETIRAcetam (KEPPRA) 500 MG tablet TAKE 1 TABLET(500 MG) BY MOUTH TWICE DAILY 10/17/15   [name], MD  loperamide (IMODIUM) 2 MG capsule TAKE 1 TO 2 CAPSULES BY MOUTH 3 TIMES A DAY AS NEEDED FOR DIARRHEA 08/20/15   [name], MD  meclizine (ANTIVERT) 25 MG tablet TAKE 1 TABLET (25 MG TOTAL) BY MOUTH 3 (THREE) TIMES DAILY AS NEEDED. 10/17/15   [name], MD  Multiple Vitamin (MULTIVITAMIN WITH MINERALS) TABS tablet Take 1 tablet by mouth daily. 10/17/15   [name], MD  naproxen (NAPROSYN) 500 MG tablet Take 1 tablet (500  mg total) by mouth 2 (two) times daily with a meal. 10/17/15   [name], MD  oxybutynin (DITROPAN) 5 MG tablet Take 1 tablet (5 mg total) by mouth 4 (four) times daily. 10/17/15   [name], MD  Potassium Chloride ER 20 MEQ TBCR Take 1 tablet by mouth every other day.  11/29/14   Historical Provider, MD    Family History Family History  Problem Relation Age of Onset  . Alcohol abuse Mother   . Depression Mother   . Heart disease Mother   . Diabetes Mother   . Stroke Mother   . Coronary artery disease Father   . Diabetes Other     1/2 brother  . Hepatitis Brother     Social History Social History  Substance Use Topics  . Smoking status: Former Smoker    Packs/day: 0.50    Years: 30.00    Types: Cigarettes    Quit date: 12/15/2014  . Smokeless tobacco: Never Used     Comment: 3 cigs a day  . Alcohol use No     Comment: Alcohol stopped in September of 2014     Allergies   Immune globulins; Ciprofloxacin; Flagyl [metronidazole]; Lisinopril; Sulfa antibiotics; Requip [ropinirole hcl]; Betamethasone dipropionate; Bupropion hcl; Clobetasol; Codeine; Escitalopram oxalate; Fluoxetine  hcl; Fluticasone-salmeterol; Furosemide; Paroxetine; Penicillins; Tacrolimus; Tetanus toxoid; and Tuberculin purified protein derivative   Review of Systems Review of Systems  Constitutional: Negative for chills, diaphoresis and fever.  HENT: Negative for trouble swallowing.   Eyes: Negative for visual disturbance.  Respiratory: Positive for cough ( productive, white phlegm). Negative for shortness of breath.   Cardiovascular: Negative for chest pain.  Gastrointestinal: Negative for abdominal pain, nausea and vomiting.  Genitourinary: Negative for dysuria and hematuria.       Chronic urge incontinence, on oxybutin  Musculoskeletal: Positive for back pain ( chronic ). Negative for neck pain.  Skin: Positive for wound ( scabs on knees). Negative for rash.  Neurological: Positive for weakness ( generalized). Negative for dizziness, syncope, light-headedness, numbness and headaches.     Physical Exam Updated Vital Signs BP 110/62 (BP Location: Left Arm)   Pulse 80   Resp 20   SpO2 93%   Physical Exam  Constitutional: He appears well-developed and well-nourished. No distress.  HENT:  Head: Normocephalic and atraumatic.  Mouth/Throat: Oropharynx is clear and moist. No oropharyngeal exudate.  Eyes: Conjunctivae and EOM are normal. Pupils are equal, round, and reactive to light. Right eye exhibits no discharge. Left eye exhibits no discharge. No scleral icterus.  Neck: Normal range of motion and phonation normal. Neck supple. No neck rigidity. Normal range of motion present.  Cardiovascular: Normal rate, regular rhythm, normal heart sounds and intact distal pulses.   No murmur heard. Pulmonary/Chest: Effort normal. No stridor. No respiratory distress. He has decreased breath sounds. He has no wheezes. He has no rales. He exhibits tenderness.    Abdominal: Soft. Bowel sounds are normal. He exhibits no distension. There is no tenderness. There is no rigidity, no rebound, no guarding and  no CVA tenderness.  Musculoskeletal: Normal range of motion.  Right forearm in splint. Fingers are swollen and bruised. Patient able to move fingers. Sensation intact. Capillary refill <3seconds. No TTP of posterior calf. No lower leg swelling. No palpable cords.   Lymphadenopathy:    He has no cervical adenopathy.  Neurological: He is alert. He is not disoriented. Coordination and gait normal. GCS eye subscore is 4. GCS verbal subscore  is 5. GCS motor subscore is 6.  Mental Status:  Alert, thought content appropriate, able to give a coherent history. Speech fluent without evidence of aphasia. Able to follow 2 step commands without difficulty.  Cranial Nerves:  II: pupils equal, round, reactive to light III,IV, VI: ptosis not present, extra-ocular motions intact bilaterally  V,VII: smile symmetric, facial light touch sensation equal VIII: hearing grossly normal to voice  IX, X: uvula elevates symmetrically  XI: bilateral shoulder shrug symmetric and strong XII: midline tongue extension without fassiculations   Skin: Skin is warm and dry. He is not diaphoretic.     Psychiatric: He has a normal mood and affect. His behavior is normal. His speech is tangential.  Requires frequent redirection.      ED Treatments / Results  Labs (all labs ordered are listed, but only abnormal results are displayed) Labs Reviewed  COMPREHENSIVE METABOLIC PANEL - Abnormal; Notable for the following:       Result Value   Potassium 3.4 (*)    Calcium 8.7 (*)    All other components within normal limits  CBC WITH DIFFERENTIAL/PLATELET    EKG  EKG Interpretation  Date/Time:  Sunday November 02 2015 15:47:37 EDT Ventricular Rate:  73 PR Interval:    QRS Duration: 105 QT Interval:  391 QTC Calculation: 431 R Axis:   -8 Text Interpretation:  Sinus rhythm Nonspecific T abnrm, anterolateral leads Confirmed by Jeneen Rinks  MD, [place] ([id number]) on 11/02/2015 4:11:17 PM       Radiology Dg Chest 2  View  Result Date: 11/02/2015 CLINICAL DATA:  Cough. EXAM: CHEST  2 VIEW COMPARISON:  10/29/2015 chest radiograph. FINDINGS: Stable cardiomediastinal silhouette with mild cardiomegaly and aortic atherosclerosis. No pneumothorax. No pleural effusion. No pulmonary edema. Patchy opacity in both lower lobes, not appreciably changed. Stable eventration anterior right hemidiaphragm. IMPRESSION: 1. Stable mild cardiomegaly without overt pulmonary edema . 2. Stable mild patchy bilateral lower lobe opacity, nonspecific, favor atelectasis, cannot exclude a component of aspiration or pneumonia. Electronically Signed   By: Ilona Sorrel M.D.   On: 11/02/2015 16:49   Dg Ribs Unilateral W/chest Right  Result Date: 11/02/2015 CLINICAL DATA:  Right rib pain after fall. EXAM: RIGHT RIBS AND CHEST - 3+ VIEW COMPARISON:  Chest radiograph from earlier today. FINDINGS: There are acute mildly displaced fractures of the anterior right sixth and seventh ribs. No additional fracture. No suspicious focal osseous lesion. Stable eventration of the anterior right hemidiaphragm with patchy bibasilar lung opacities. No overt pneumothorax on these three views. IMPRESSION: 1. Acute mildly displaced anterior right sixth and seventh rib fractures. No overt pneumothorax on these rib views . 2. Stable eventration of the anterior right hemidiaphragm with patchy nonspecific bibasilar lung opacities, favor atelectasis, cannot exclude a component of aspiration or pneumonia. Electronically Signed   By: Ilona Sorrel M.D.   On: 11/02/2015 16:54    Procedures Procedures (including critical care time)  Medications Ordered in ED Medications  cefTRIAXone (ROCEPHIN) 1 g in dextrose 5 % 50 mL IVPB (not administered)  azithromycin (ZITHROMAX) 500 mg in dextrose 5 % 250 mL IVPB (not administered)     Initial Impression / Assessment and Plan / ED Course  I have reviewed the triage vital signs and the nursing notes.  Pertinent labs & imaging  results that were available during my care of the patient were reviewed by me and considered in my medical decision making (see chart for details).  Clinical Course  Value Comment By Time  DG Ribs Unilateral W/Chest Right 6th and 7th rib fracture noted Roxanna Mew, PA-C 09/17 1716  DG Chest 2 View B/l patchy opacity in lower lungs.  Roxanna Mew, [place] 09/17 [phone]    Patient presents to ED with complaint of right sided rib pain and productive cough. Patient is afebrile and non-toxic appearing in NAD. Vital signs remarkable for new onset hypoxia - patient does not use home O2. O2 sats found to be in low 80's. Placed on 3L  O2 with improvement in O2 sats to >91%. Decreased lung sounds b/l. Exquisite tenderness of right rib cage to palpation. Right arm placed in sugar tong splint s/p fall last week with distal radius fracture. Will check CBC and CMP, EKG, and CXR with right ribs.   CBC and CMP re-assuring. EKG shows sinus rhythm. X-ray concerning for b/l lower lobe patchy opacity - ?PNA vs. ?atelectasis as well as 6th and 7th rib fractures. Patient is elderly, debilitated, with multiple co-morbid medical conditions specifically COPD - concern for PNA. Suspect hypoxemia secondary to possible PNA. Will consult hospitalist for admission for management of hypoxemia and PNA. Discussed patient with Dr. Jeneen Rinks, who also evaluated patient - agrees with plan.   6:02 PM: Spoke with [facility], greatly appreciate their time and input. Agree to admit patient. Patient to be transferred to [facility] for further management of PNA and new onset hypoxemia. Per pharmacy consult IV ceftriaxone and azithromycin.  Final Clinical Impressions(s) / ED Diagnoses   Final diagnoses:  Hypoxia  Community acquired pneumonia    New Prescriptions New Prescriptions   No medications on file     Roxanna Mew, PA-C 11/02/15 [place] Lili Harts, PA-C 11/02/15 1831    Tanna Furry, MD 11/02/15  2310

## 2015-11-02 NOTE — ED Notes (Signed)
[facility] called for transport. 

## 2015-11-02 NOTE — Progress Notes (Signed)
Pharmacy Antibiotic Follow-up Note  Patrick Holt is a 65 y.o. year-old male admitted on 11/02/2015.  The patient is currently on day 1 of Rocephin 7 Azithromycin for CAP.  Assessment/Plan: This patient's current antibiotics will be continued without adjustments.  Rocephin and Zithromax need no adjustment for renal function  No data recorded.   Recent Labs Lab 10/29/15 1337 11/02/15 1654  WBC 9.2 9.0    Recent Labs Lab 10/29/15 1337 11/02/15 1654  CREATININE 1.42* 1.04   CrCl cannot be calculated (Unknown ideal weight.).    Allergies  Allergen Reactions  . Immune Globulins Other (See Comments)    Acute renal failure  . Ciprofloxacin Swelling  . Flagyl [Metronidazole] Swelling  . Lisinopril Diarrhea  . Sulfa Antibiotics Other (See Comments)    blisters  . Requip [Ropinirole Hcl]     constipation  . Betamethasone Dipropionate Other (See Comments)    Unknown  . Bupropion Hcl Other (See Comments)    Unknown  . Clobetasol Other (See Comments)    Unknown  . Codeine Other (See Comments)    Unknown  . Escitalopram Oxalate Other (See Comments)    Unknown  . Fluoxetine Hcl Other (See Comments)    Unknown  . Fluticasone-Salmeterol Other (See Comments)    Unknown  . Furosemide Other (See Comments)    Unknown  . Paroxetine Other (See Comments)    Unknown  . Penicillins Other (See Comments)    Unknown  . Tacrolimus Other (See Comments)    Unknown  . Tetanus Toxoid Other (See Comments)    Unknown  . Tuberculin Purified Protein Derivative Other (See Comments)    Unknown   Antimicrobials this admission: 9/17 Ceftriaxone >>  9/17 Azithromycin >>   Microbiology results: None ordered  Thank you for allowing pharmacy to be a part of this patient's care.  Minda Ditto PharmD 11/02/2015 6:21 PM

## 2015-11-02 NOTE — ED Notes (Signed)
Bed: [id number] Expected date:  Expected time:  Means of arrival:  Comments: EMS,

## 2015-11-02 NOTE — H&P (Signed)
[facility] Admission History and Physical Service Pager: [phone]  Patient name: Patrick Holt Medical record number: [id number] Date of birth: 12/23/1950 Age: 65 y.o. Gender: male  Primary Care Provider: [name], MD Consultants: None  Code Status: FULL   Chief Complaint: Rib pain   Assessment and Plan: DEMARRION VILLADA is a 65 y.o. male presenting with hypoxia and cough, found to have rib fracture s/p fall . PMH is significant for COPD, AFib (on Amiodarone; not candidate for anticoagulation due to hx of subdural hematoma), HFpEF , HTN, CAD, Depression/Anxiety/Schizoprenia, GERD, hx of seizure do, diverticulosis.  Hypoxia: In setting of cough and anterior right 6th and 7th rib fractures. Required 3L O2 in ED to maintain appropatie O2 saturations. Patient has history of COPD; is not on home O2. CXR with patchy bilateral lower lobe opacity (appears fairly similar to CXR from 9/13) which was read as nonspecific, favoring atelectasis, but unable to exclude a component of aspiration or PNA. Suspect atelectasis secondary to lack of deep inspiration in setting of rib fractures as likely cause of hypoxia. May have mild component of COPD exacerbation as well given new onset cough.  Patient is afebrile and without leukocytosis, so less suspicious for PNA. Well's score of 0 so low concern for PE as cause of hypoxia.  -admit to telemetry, vital signs per unit with pulse ox checks  -patient s/p CTX and Azithromycin, discontinued given lack of suspicion for PNA  -supplemental O2 prn, wean as tolerated  -duonebs q6h/albuterol q4h prn  -prednisone 50 mg daily  -incentive spirometer  -pain control for rib fx: tylenol and ibuprofen  - consider adding Voltaren gel if pain control becomes an issue   -would avoid narcotics in this patient due to h/o falls and past history of narcotic addiction -PT/OT consult   Right Distal Radial Fracture: S/p mechanical fall on 9/13.  Splint placed for wrist fracture at ED visit. Patient to follow up with hand surgery but reports difficulty with establishing appointment.  -PT/OT consult  -help to facilitate follow up outpatient for radial fracture   Recurrent Falls: Patient reports history of recurrent falls. Most recently fell on 9/13. Patient reports hitting head during that fall. CT head without acute findings on 9/13.  -fall precautions  -UDS  -low threshold to re-image head if patient has change in mental status or neuro exam  -avoid narcotics given recurrent falls  -continue meclizine prn   COPD: has history of COPD; home med Combivent. PFT 2014: FEV1 96%, FEV1/FVC 66%. Patient hospitalized in November 2016 for COPD exacerbation.  - plan as noted above - holding Combivent due to Duonebs  -consider additional controller medication such as LABA +/- ICS   AFib: In sinus rhythm on ED EKG and rate controlled. Not a candidate for anticoagulation due to "tiny bilateral frontal subarachnoid or subdural hemorrhages without significant trauma" seen on MRI 02/04/2012. Patient does not appear to be on BB. Upon chart review, patient recently requested metoprolol refill but was denied by PCP due to not being on medication for several years; PCP recommended f/u with cardiology regarding BB.  - Continue home Amiodarone 100mg  qd - EKG AM  HTN: Blood pressures have been stable. 110-118/62-84. - continue home amlodipine 10 mg daily, consider holding in AM if blood pressures continue to remain normotensive   CAD: - ASA and Lipitor   Depression/Anxiety/Schizoprenia: - continue Depakote  Hx of Seizure: - continue Keppra 500mg  BID  GERD: - continue Protonix  Parkinsonism: -  continue requip   Chronic Diarrhea: - continue Imodium PRN  Urge Incontinence : -continue oxybutynin; consider alternate therapy given this medications anti-cholinergic properties   FEN/GI: Soft Diet (patient without dentures), NS at 100  cc/hr x12h then SLIV  Prophylaxis: Lovenox   Disposition: Admit to FPTS; Attending McDiarmid   History of Present Illness:  Patrick Holt is a 65 y.o. male presenting with right rib pain. Patient went to [facility] ED today for right rib pain s/p fall 3 days ago. Was found to be hypoxic on RA requiring 3L O2 by [place] to maintain appropriate saturations. Patient reports long history of recurrent falls. States that at his most recent fall he broke his right wrist and hit his head. Reports also knowing that his ribs have been broken since then.   He has pain with inspiration and has been avoiding taking deep breaths. Denies SOB. Reports non-productive cough. Unable to specify how long cough has been present. RN in room does report white phlegm witnessed at [facility] per nursing report. Patient denies hemoptysis. Denies history of DVT/PE. Denies chest pain. Afebrile at home.   Review Of Systems: Per HPI with the following additions: None  Otherwise the remainder of the systems were negative.  Patient Active Problem List   Diagnosis Date Noted  . Anxiety 06/06/2015  . Impaired mobility 04/28/2015  . Lumbar spondylosis with myelopathy 03/07/2015  . Nocturia 12/03/2014  . Chronic venous insufficiency 09/23/2014  . Poor social situation 07/30/2014  . Schizophrenia, acute ([place])   . Schizoaffective disorder, unspecified type ([place])   . Decreased visual acuity 06/27/2014  . Right sided weakness 01/04/2014  . Essential hypertension, benign   . Atrial fibrillation, unspecified   . Radiculopathy of cervical region   . Polyuria 12/07/2013  . Erectile dysfunction 12/07/2013  . Diverticulitis 08/01/2013  . Acute diverticulitis 08/01/2013  . Hematochezia 06/04/2013  . Hypokalemia 10/03/2012  . Benign neoplasm of colon 09/27/2012  . Stricture and stenosis of esophagus 09/27/2012  . Diverticulitis of colon without hemorrhage 07/26/2012  . Polypharmacy 03/26/2012  . Seizures ([place]) 02/10/2012  .  Atrial fibrillation ([place]) 02/05/2012  . Coronary atherosclerosis of native coronary artery 02/05/2012  . Radicular low back pain 03/09/2011  . History of seizure disorder 01/29/2011  . Irritable bowel syndrome (IBS) 06/24/2010  . Urge incontinence 06/19/2010  . TOBACCO USER 11/23/2008  . DEPRESSION, MAJOR 05/31/2008  . ISCHEMIC HEART DISEASE 05/31/2008  . RAYNAUD'S DISEASE 05/31/2008  . HIATAL HERNIA 05/31/2008  . Rheumatoid arthritis ([place]) 05/31/2008  . FIBROMYALGIA 05/31/2008  . Hyperlipidemia 01/26/2008  . Anxiety state 01/26/2008  . HEMORRHOIDS, INTERNAL 01/26/2008  . COPD (chronic obstructive pulmonary disease) ([place]) 01/26/2008  . ESOPHAGEAL MOTILITY DISORDER 01/26/2008  . GERD 01/26/2008  . Dermatomyositis ([place]) 01/26/2008    Past Medical History: Past Medical History:  Diagnosis Date  . Anemia   . Anxiety   . Asthma   . Atrial fibrillation ([place]) 01/2012   with RVR  . Bipolar 1 disorder ([place])   . Cancer ([place])   . Collagen vascular disease ([place])   . Conversion disorder 06/2010  . COPD (chronic obstructive pulmonary disease) ([place])   . Dermatomyositis ([place])   . Dermatomyositis ([place])   . Diverticulitis   . Diverticulosis of colon   . Dysrhythmia    "irregular" (11/15/2012)  . Esophageal dysmotility   . Esophageal stricture   . Fibromyalgia   . Gastritis   . GERD (gastroesophageal reflux disease)   . Headache(784.0)    "  severe; get them often" (11/15/2012)  . Hiatal hernia   . History of narcotic addiction ([place])   . Hx of adenomatous colonic polyps   . Hyperlipidemia   . Hypertension   . Internal hemorrhoids   . Ischemic heart disease   . Major depression ([place])    with acute psychotic break in 06/2010  . Myocardial infarct ([place])    mulitple (1999, 2000, 2004)  . Narcotic dependence ([place])   . Nephrolithiasis   . Obesity   . OCD (obsessive compulsive disorder)   . Otosclerosis   . Paroxysmal a-fib ([place])   . Peripheral neuropathy ([place])   . Raynaud's disease    . Renal insufficiency   . Rheumatoid arthritis(714.0)   . Sarcoidosis ([place])   . Seizures ([place])   . Subarachnoid hemorrhage ([place]) 01/2012   with subdural  hematoma.   . Type II diabetes mellitus ([place])   . Urge incontinence   . Vertigo     Past Surgical History: Past Surgical History:  Procedure Laterality Date  . BACK SURGERY    . CARDIAC CATHETERIZATION    . CARPAL TUNNEL RELEASE Bilateral   . CATARACT EXTRACTION W/ INTRAOCULAR LENS IMPLANT Left   . COLONOSCOPY N/A 09/27/2012   Procedure: COLONOSCOPY;  Surgeon: Lafayette Dragon, MD;  Location: [facility] END[facility]COPY;  Service: Endoscopy;  Laterality: N/A;  . ESOPHAGOGASTRODUODENOSCOPY N/A 09/27/2012   Procedure: ESOPHAGOGASTRODUODENOSCOPY (EGD);  Surgeon: Lafayette Dragon, MD;  Location: [facility];  Service: Endoscopy;  Laterality: N/A;  . KNEE ARTHROSCOPY W/ MENISCAL REPAIR Left 2009  . LUMBAR DISC SURGERY    . LYMPH NODE DISSECTION Right 1970's   "neck; dr thought I had Hodgkins; turned out to be sarcoidosis" (11/15/2012)  . squamous papilloma   2010   removed by Dr. Constance Holster ENT, noted on tongue  . TONSILLECTOMY      Social History: Social History  Substance Use Topics  . Smoking status: Former Smoker    Packs/day: 0.50    Years: 30.00    Types: Cigarettes    Quit date: 12/15/2014  . Smokeless tobacco: Never Used     Comment: 3 cigs a day  . Alcohol use No     Comment: Alcohol stopped in September of 2014   Additional social history: Lives in senior citizen living facility. Smokes 1 cigarette per day. Drinks a few sips of red wine per day because his cardiologist told him it was good for him.   Please also refer to relevant sections of EMR.  Family History: Family History  Problem Relation Age of Onset  . Alcohol abuse Mother   . Depression Mother   . Heart disease Mother   . Diabetes Mother   . Stroke Mother   . Coronary artery disease Father   . Diabetes Other     1/2 brother  . Hepatitis Brother     Allergies and  Medications: Allergies  Allergen Reactions  . Immune Globulins Other (See Comments)    Acute renal failure  . Ciprofloxacin Swelling  . Flagyl [Metronidazole] Swelling  . Lisinopril Diarrhea  . Sulfa Antibiotics Other (See Comments)    blisters  . Requip [Ropinirole Hcl]     constipation  . Betamethasone Dipropionate Other (See Comments)    Unknown  . Bupropion Hcl Other (See Comments)    Unknown  . Clobetasol Other (See Comments)    Unknown  . Codeine Other (See Comments)    Unknown  . Escitalopram Oxalate Other (See Comments)  Unknown  . Fluoxetine Hcl Other (See Comments)    Unknown  . Fluticasone-Salmeterol Other (See Comments)    Unknown  . Furosemide Other (See Comments)    Unknown  . Paroxetine Other (See Comments)    Unknown  . Penicillins Other (See Comments)    Unknown  . Tacrolimus Other (See Comments)    Unknown  . Tetanus Toxoid Other (See Comments)    Unknown  . Tuberculin Purified Protein Derivative Other (See Comments)    Unknown   Current Facility-Administered Medications on File Prior to Encounter  Medication Dose Route Frequency Provider Last Rate Last Dose  . Ipratropium-Albuterol (COMBIVENT) respimat 1 puff  1 puff Inhalation Q6H PRN [name], MD       Current Outpatient Prescriptions on File Prior to Encounter  Medication Sig Dispense Refill  . ALPRAZolam (XANAX) 0.5 MG tablet Take 1 tablet (0.5 mg total) by mouth 3 (three) times daily as needed for anxiety. 90 tablet 0  . amiodarone (PACERONE) 200 MG tablet Take 0.5 tablets (100 mg total) by mouth daily. 45 tablet 3  . amLODipine (NORVASC) 10 MG tablet Take 1 tablet (10 mg total) by mouth daily. 90 tablet 1  . aspirin EC 81 MG tablet Take 1 tablet (81 mg total) by mouth daily. 90 tablet 3  . atorvastatin (LIPITOR) 40 MG tablet Take 1 tablet (40 mg total) by mouth daily. 90 tablet 3  . divalproex (DEPAKOTE ER) 250 MG 24 hr tablet Take 2 tablets (500 mg total) by mouth daily. 30 tablet 2   . esomeprazole (NEXIUM) 40 MG capsule Take 1 capsule (40 mg total) by mouth daily at 12 noon. 90 capsule 0  . guaiFENesin (MUCINEX) 600 MG 12 hr tablet Take 1 tablet (600 mg total) by mouth 2 (two) times daily. OTC 20 tablet 1  . levETIRAcetam (KEPPRA) 500 MG tablet TAKE 1 TABLET(500 MG) BY MOUTH TWICE DAILY 180 tablet 0  . loperamide (IMODIUM) 2 MG capsule TAKE 1 TO 2 CAPSULES BY MOUTH 3 TIMES A DAY AS NEEDED FOR DIARRHEA 90 capsule 3  . meclizine (ANTIVERT) 25 MG tablet TAKE 1 TABLET (25 MG TOTAL) BY MOUTH 3 (THREE) TIMES DAILY AS NEEDED. 30 tablet 0  . Multiple Vitamin (MULTIVITAMIN WITH MINERALS) TABS tablet Take 1 tablet by mouth daily. 90 tablet 3  . naproxen (NAPROSYN) 500 MG tablet Take 1 tablet (500 mg total) by mouth 2 (two) times daily with a meal. 30 tablet 0  . oxybutynin (DITROPAN) 5 MG tablet Take 1 tablet (5 mg total) by mouth 4 (four) times daily. 360 tablet 3  . Potassium Chloride ER 20 MEQ TBCR Take 1 tablet by mouth every other day.   0    Objective: BP 110/62 (BP Location: Left Arm)   Pulse 80   Resp 20   SpO2 93%  Exam: General: Lying in bed, NAD  Head: Small, well healing scab present on occiput.  Eyes: EOMI. PERRL.  ENTM: MMM and lips slightly dry. Oropharynx clear. No dentures in place.  Cardiovascular: RRR. No murmurs appreciated. 2+ pedal pulses. No LE edema.  Respiratory: CTAB with diminished air movements in bases bilaterally. [place] in place. Comfortable WOB. No wheezes or crackles appreciated. TTP over right chest wall/lateral ribs.  Abdomen: +BS, soft, NTND  MSK: Right forearm in splint with swollen fingers. Able to wiggle fingers of right hand and has good cap refill.  Skin: Warm and dry.  Bruise to left elbow  Neuro: A&Ox3. CN II_XII grossly  intact.  Psych: Tangential thought process requiring redirection multiple times. Normal affect.   Labs and Imaging: CBC BMET   Recent Labs Lab 11/02/15 1654  WBC 9.0  HGB 15.6  HCT 47.5  PLT 269    Recent  Labs Lab 11/02/15 1654  NA 143  K 3.4*  CL 108  CO2 28  BUN 18  CREATININE 1.04  GLUCOSE 87  CALCIUM 8.7*     Dg Chest 2 View  Result Date: 11/02/2015 CLINICAL DATA:  Cough. EXAM: CHEST  2 VIEW COMPARISON:  10/29/2015 chest radiograph. FINDINGS: Stable cardiomediastinal silhouette with mild cardiomegaly and aortic atherosclerosis. No pneumothorax. No pleural effusion. No pulmonary edema. Patchy opacity in both lower lobes, not appreciably changed. Stable eventration anterior right hemidiaphragm. IMPRESSION: 1. Stable mild cardiomegaly without overt pulmonary edema . 2. Stable mild patchy bilateral lower lobe opacity, nonspecific, favor atelectasis, cannot exclude a component of aspiration or pneumonia. Electronically Signed   By: Ilona Sorrel M.D.   On: 11/02/2015 16:49   Dg Ribs Unilateral W/chest Right  Result Date: 11/02/2015 CLINICAL DATA:  Right rib pain after fall. EXAM: RIGHT RIBS AND CHEST - 3+ VIEW COMPARISON:  Chest radiograph from earlier today. FINDINGS: There are acute mildly displaced fractures of the anterior right sixth and seventh ribs. No additional fracture. No suspicious focal osseous lesion. Stable eventration of the anterior right hemidiaphragm with patchy bibasilar lung opacities. No overt pneumothorax on these three views. IMPRESSION: 1. Acute mildly displaced anterior right sixth and seventh rib fractures. No overt pneumothorax on these rib views . 2. Stable eventration of the anterior right hemidiaphragm with patchy nonspecific bibasilar lung opacities, favor atelectasis, cannot exclude a component of aspiration or pneumonia. Electronically Signed   By: Ilona Sorrel M.D.   On: 11/02/2015 16:54    Nicolette Bang, DO 11/02/2015, 6:12 PM PGY-2, [place] Intern pager: [phone], text pages welcome

## 2015-11-02 NOTE — ED Provider Notes (Signed)
Pt seen and evaluated.  D/W Gay Filler PA-C. Patient claims right chest pain. Is tender over the right lateral chest. Is decreased breath sounds at bases with crackles. Chest x-ray shows fractures the right lateral sixth, seventh ribs with atelectasis versus infiltrate. He is requiring 3 L to maintain 91% better. His right arm is in a sugar tong splint after recent nondisplaced radial fracture. He will require admission for pain control, Pulmicort toilet, plus minus antibiotic. We'll discussed with hospitalist.   Please see additional notes by Gay Filler PA-C.   Tanna Furry, MD 11/02/15 [phone]

## 2015-11-02 NOTE — ED Notes (Signed)
I attempted to collect labs twice and was unsuccessful 

## 2015-11-02 NOTE — ED Triage Notes (Signed)
Per EMS - patient comes from home with c/o right rib pain.  Patient was seen at [facility] ED following fall 3 days ago.  Patient states his pain increases with inspiration and he has non productive cough.  Patient has a cast on his arm, presumably from [facility] 4 days ago.  Patient denies any new trauma, falls, etc.  He denies chest pain, N/V and LOC.  Vitals WNL 118/76, HR 80, RR 20, 96% on RA, CBG 140.

## 2015-11-03 DIAGNOSIS — J441 Chronic obstructive pulmonary disease with (acute) exacerbation: Secondary | ICD-10-CM | POA: Diagnosis not present

## 2015-11-03 DIAGNOSIS — S52613D Displaced fracture of unspecified ulna styloid process, subsequent encounter for closed fracture with routine healing: Secondary | ICD-10-CM

## 2015-11-03 DIAGNOSIS — R0902 Hypoxemia: Secondary | ICD-10-CM | POA: Diagnosis not present

## 2015-11-03 DIAGNOSIS — S2241XA Multiple fractures of ribs, right side, initial encounter for closed fracture: Secondary | ICD-10-CM | POA: Diagnosis not present

## 2015-11-03 DIAGNOSIS — I480 Paroxysmal atrial fibrillation: Secondary | ICD-10-CM | POA: Diagnosis not present

## 2015-11-03 DIAGNOSIS — J449 Chronic obstructive pulmonary disease, unspecified: Secondary | ICD-10-CM | POA: Diagnosis not present

## 2015-11-03 DIAGNOSIS — S52351A Displaced comminuted fracture of shaft of radius, right arm, initial encounter for closed fracture: Secondary | ICD-10-CM | POA: Diagnosis not present

## 2015-11-03 DIAGNOSIS — S62101D Fracture of unspecified carpal bone, right wrist, subsequent encounter for fracture with routine healing: Secondary | ICD-10-CM

## 2015-11-03 DIAGNOSIS — S6991XA Unspecified injury of right wrist, hand and finger(s), initial encounter: Secondary | ICD-10-CM

## 2015-11-03 DIAGNOSIS — S52611D Displaced fracture of right ulna styloid process, subsequent encounter for closed fracture with routine healing: Secondary | ICD-10-CM | POA: Diagnosis not present

## 2015-11-03 DIAGNOSIS — S6990XA Unspecified injury of unspecified wrist, hand and finger(s), initial encounter: Secondary | ICD-10-CM

## 2015-11-03 DIAGNOSIS — R296 Repeated falls: Secondary | ICD-10-CM

## 2015-11-03 DIAGNOSIS — S62101A Fracture of unspecified carpal bone, right wrist, initial encounter for closed fracture: Secondary | ICD-10-CM

## 2015-11-03 LAB — COMPREHENSIVE METABOLIC PANEL
ALBUMIN: 2.9 g/dL — AB (ref 3.5–5.0)
ALK PHOS: 70 U/L (ref 38–126)
ALT: 19 U/L (ref 17–63)
AST: 30 U/L (ref 15–41)
Anion gap: 9 (ref 5–15)
BILIRUBIN TOTAL: 0.7 mg/dL (ref 0.3–1.2)
BUN: 14 mg/dL (ref 6–20)
CALCIUM: 8.4 mg/dL — AB (ref 8.9–10.3)
CO2: 26 mmol/L (ref 22–32)
CREATININE: 1.11 mg/dL (ref 0.61–1.24)
Chloride: 105 mmol/L (ref 101–111)
GFR calc Af Amer: >60 mL/min (ref >60)
GLUCOSE: 101 mg/dL — AB (ref 65–99)
Potassium: 3.3 mmol/L — ABNORMAL LOW (ref 3.5–5.1)
Sodium: 140 mmol/L (ref 135–145)
TOTAL PROTEIN: 6.3 g/dL — AB (ref 6.5–8.1)

## 2015-11-03 LAB — RAPID URINE DRUG SCREEN, HOSP PERFORMED
Amphetamines: NOT DETECTED
BARBITURATES: NOT DETECTED
BENZODIAZEPINES: POSITIVE — AB
Cocaine: NOT DETECTED
Opiates: NOT DETECTED
Tetrahydrocannabinol: NOT DETECTED

## 2015-11-03 LAB — GLUCOSE, CAPILLARY: GLUCOSE-CAPILLARY: 144 mg/dL — AB (ref 65–99)

## 2015-11-03 MED ORDER — ATORVASTATIN CALCIUM 40 MG PO TABS
40.0000 mg | ORAL_TABLET | Freq: Every day | ORAL | Status: DC
  Administered 2015-11-03 – 2015-11-04 (×2): 40 mg via ORAL
  Filled 2015-11-03 (×2): qty 1

## 2015-11-03 MED ORDER — ALBUTEROL SULFATE (2.5 MG/3ML) 0.083% IN NEBU
2.5000 mg | INHALATION_SOLUTION | RESPIRATORY_TRACT | Status: DC | PRN
Start: 2015-11-03 — End: 2015-11-05

## 2015-11-03 MED ORDER — LOPERAMIDE HCL 2 MG PO CAPS: 2.0000 mg | ORAL_CAPSULE | Freq: Three times a day (TID) | ORAL | Status: DC | PRN

## 2015-11-03 MED ORDER — MECLIZINE HCL 25 MG PO TABS: 25.0000 mg | ORAL_TABLET | Freq: Three times a day (TID) | ORAL | Status: DC | PRN

## 2015-11-03 MED ORDER — IPRATROPIUM-ALBUTEROL 0.5-2.5 (3) MG/3ML IN SOLN
3.0000 mL | Freq: Four times a day (QID) | RESPIRATORY_TRACT | Status: DC
  Administered 2015-11-03 (×2): 3 mL via RESPIRATORY_TRACT
  Filled 2015-11-03 (×2): qty 3

## 2015-11-03 MED ORDER — ACETAMINOPHEN 325 MG PO TABS
650.0000 mg | ORAL_TABLET | Freq: Four times a day (QID) | ORAL | Status: DC | PRN
Start: 2015-11-03 — End: 2015-11-05

## 2015-11-03 MED ORDER — PANTOPRAZOLE SODIUM 40 MG PO TBEC
40.0000 mg | DELAYED_RELEASE_TABLET | Freq: Every day | ORAL | Status: DC
  Administered 2015-11-03 – 2015-11-05 (×3): 40 mg via ORAL
  Filled 2015-11-03 (×3): qty 1

## 2015-11-03 MED ORDER — LEVETIRACETAM 500 MG PO TABS
500.0000 mg | ORAL_TABLET | Freq: Two times a day (BID) | ORAL | Status: DC
  Administered 2015-11-03 – 2015-11-05 (×6): 500 mg via ORAL
  Filled 2015-11-03 (×7): qty 1

## 2015-11-03 MED ORDER — BISACODYL 5 MG PO TBEC
5.0000 mg | DELAYED_RELEASE_TABLET | Freq: Every day | ORAL | Status: DC | PRN
  Administered 2015-11-03: 5 mg via ORAL
  Filled 2015-11-03: qty 1

## 2015-11-03 MED ORDER — AMLODIPINE BESYLATE 10 MG PO TABS
10.0000 mg | ORAL_TABLET | Freq: Every day | ORAL | Status: DC
  Administered 2015-11-03 – 2015-11-04 (×2): 10 mg via ORAL
  Filled 2015-11-03 (×2): qty 1

## 2015-11-03 MED ORDER — ENOXAPARIN SODIUM 40 MG/0.4ML ~~LOC~~ SOLN
40.0000 mg | Freq: Every day | SUBCUTANEOUS | Status: DC
  Administered 2015-11-03 – 2015-11-05 (×3): 40 mg via SUBCUTANEOUS
  Filled 2015-11-03 (×3): qty 0.4

## 2015-11-03 MED ORDER — ALPRAZOLAM 0.5 MG PO TABS
0.5000 mg | ORAL_TABLET | Freq: Three times a day (TID) | ORAL | Status: DC | PRN
  Administered 2015-11-04: 0.5 mg via ORAL
  Filled 2015-11-03: qty 1

## 2015-11-03 MED ORDER — POTASSIUM CHLORIDE CRYS ER 20 MEQ PO TBCR
40.0000 meq | EXTENDED_RELEASE_TABLET | Freq: Once | ORAL | Status: AC
  Administered 2015-11-03: 40 meq via ORAL
  Filled 2015-11-03: qty 2

## 2015-11-03 MED ORDER — SODIUM CHLORIDE 0.9 % IV SOLN
INTRAVENOUS | Status: AC
  Administered 2015-11-03: 03:00:00 via INTRAVENOUS

## 2015-11-03 MED ORDER — AZITHROMYCIN 500 MG PO TABS
500.0000 mg | ORAL_TABLET | Freq: Every day | ORAL | Status: DC
  Administered 2015-11-03 – 2015-11-04 (×2): 500 mg via ORAL
  Filled 2015-11-03 (×2): qty 1

## 2015-11-03 MED ORDER — IPRATROPIUM-ALBUTEROL 0.5-2.5 (3) MG/3ML IN SOLN
3.0000 mL | Freq: Three times a day (TID) | RESPIRATORY_TRACT | Status: DC
Start: 2015-11-03 — End: 2015-11-04
  Administered 2015-11-03 (×2): 3 mL via RESPIRATORY_TRACT
  Filled 2015-11-03 (×3): qty 3

## 2015-11-03 MED ORDER — OXYBUTYNIN CHLORIDE 5 MG PO TABS: 5.0000 mg | ORAL_TABLET | Freq: Four times a day (QID) | ORAL | Status: DC

## 2015-11-03 MED ORDER — ACETAMINOPHEN 650 MG RE SUPP: 650.0000 mg | Freq: Four times a day (QID) | RECTAL | Status: DC | PRN

## 2015-11-03 MED ORDER — AMIODARONE HCL 100 MG PO TABS
100.0000 mg | ORAL_TABLET | Freq: Every day | ORAL | Status: DC
  Administered 2015-11-03 – 2015-11-05 (×3): 100 mg via ORAL
  Filled 2015-11-03 (×3): qty 1

## 2015-11-03 MED ORDER — ASPIRIN EC 81 MG PO TBEC
81.0000 mg | DELAYED_RELEASE_TABLET | Freq: Every day | ORAL | Status: DC
  Administered 2015-11-03 – 2015-11-05 (×3): 81 mg via ORAL
  Filled 2015-11-03 (×4): qty 1

## 2015-11-03 MED ORDER — IBUPROFEN 200 MG PO TABS
400.0000 mg | ORAL_TABLET | Freq: Four times a day (QID) | ORAL | Status: DC | PRN
Start: 2015-11-03 — End: 2015-11-05
  Administered 2015-11-03 – 2015-11-04 (×6): 400 mg via ORAL
  Filled 2015-11-03 (×6): qty 2

## 2015-11-03 MED ORDER — PREDNISONE 50 MG PO TABS
50.0000 mg | ORAL_TABLET | Freq: Every day | ORAL | Status: DC
  Administered 2015-11-03 – 2015-11-05 (×3): 50 mg via ORAL
  Filled 2015-11-03 (×3): qty 1

## 2015-11-03 MED ORDER — GUAIFENESIN ER 600 MG PO TB12
600.0000 mg | ORAL_TABLET | Freq: Two times a day (BID) | ORAL | Status: DC
  Administered 2015-11-03 – 2015-11-05 (×6): 600 mg via ORAL
  Filled 2015-11-03 (×6): qty 1

## 2015-11-03 MED ORDER — POLYETHYLENE GLYCOL 3350 17 G PO PACK: 17.0000 g | PACK | Freq: Every day | ORAL | Status: DC | PRN

## 2015-11-03 MED ORDER — DIVALPROEX SODIUM ER 500 MG PO TB24
500.0000 mg | ORAL_TABLET | Freq: Every day | ORAL | Status: DC
  Administered 2015-11-03 – 2015-11-05 (×3): 500 mg via ORAL
  Filled 2015-11-03 (×3): qty 1

## 2015-11-03 NOTE — Progress Notes (Signed)
Family Medicine Teaching Service Daily Progress Note Intern Pager: [phone]  Patient name: Patrick Holt Medical record number: [id number] Date of birth: [birth year] Age: 65 y.o. Gender: male  Primary Care Provider: [name], MD Consultants: None Code Status: FULL  Pt Overview and Major Events to Date:  9/17: admit for hypoxia in the context of right rib 6 & 7 fx  Assessment and Plan: LAURIANO CZAPLA is a 65 y.o. male presenting with hypoxia and cough, found to have rib fracture s/p fall . PMH is significant for COPD, AFib (on Amiodarone; not candidate for anticoagulation due to hx of subdural hematoma), HFpEF , HTN, CAD, Depression/Anxiety/Schizoprenia, GERD, hx of seizure do, diverticulosis.  Hypoxia: In setting of cough and anterior right 6th and 7th rib fractures. Required 3L O2 in ED to maintain appropatie O2 saturations. Patient has history of COPD; is not on home O2. CXR with patchy bilateral lower lobe opacity (appears fairly similar to CXR from 9/13) which was read as nonspecific, favoring atelectasis, but unable to exclude a component of aspiration or PNA. Suspect atelectasis secondary to lack of deep inspiration in setting of rib fractures as likely cause of hypoxia. May have mild component of COPD exacerbation as well given new onset cough.  Patient is afebrile and without leukocytosis, so less suspicious for PNA. Well's score of 0 so low concern for PE as cause of hypoxia.  -admit to telemetry, vital signs per unit with pulse ox checks  -patient s/p CTX and Azithromycin, discontinued given lack of suspicion for PNA  -supplemental O2 prn, wean as tolerated  -duonebs q6h/albuterol q4h prn  -prednisone 50 mg daily (9/17>>) -incentive spirometer  -pain control for rib fx: tylenol and ibuprofen - consider adding Voltaren gel if pain control becomes an issue  -would avoid narcotics in this patient due to h/o falls and past history of narcotic addiction -PT/OT consult    Right Distal Radial Fracture: S/p mechanical fall on 9/13. Splint placed for wrist fracture at ED visit. Patient to follow up with hand surgery but reports difficulty with establishing appointment.  -PT/OT consult  -help to facilitate follow up outpatient for radial fracture   Recurrent Falls: Patient reports history of recurrent falls. Most recently fell on 9/13. Patient reports hitting head during that fall. CT head without acute findings on 9/13.  -fall precautions  -UDS  -low threshold to re-image head if patient has change in mental status or neuro exam  -avoid narcotics given recurrent falls  -continue meclizine prn   COPD: has history of COPD; home med Combivent. PFT 2014: FEV1 96%, FEV1/FVC 66%. Patient hospitalized in November 2016 for COPD exacerbation.  - plan as noted above - holding Combivent due to Duonebs  - consider additional controller medication such as LABA +/- ICS   AFib: In sinus rhythm on ED EKG and rate controlled.Not a candidate for anticoagulation due to "tiny bilateral frontal subarachnoid or subdural hemorrhages without significant trauma" seen on MRI 02/04/2012. Patient does not appear to be on BB. Upon chart review, patient recently requested metoprolol refill but was denied by PCP due to not being on medication for several years; PCP recommended f/u with cardiology regarding BB.  - Continue home Amiodarone 100mg  qd - EKG AM  HTN: Blood pressures have been stable. 110-118/62-84. - continue home amlodipine 10 mg daily, consider holding in AM if blood pressures continue to remain normotensive   CAD: - ASA and Lipitor   Depression/Anxiety/Schizoprenia: - continue Depakote  Hx of Seizure: -  continue Keppra 500mg  BID  GERD: - continue Protonix  Parkinsonism: - continue requip   Chronic Diarrhea: - continue Imodium PRN  Urge Incontinence : -continue oxybutynin; consider alternate therapy given this medications anti-cholinergic  properties   FEN/GI: Soft Diet (patient without dentures), NS at 100 cc/hr x12h then SLIV  Prophylaxis: Lovenox   Disposition: pending PT/OT for discharge, awaiting improvement on weaning O2  Subjective:  Patient laying in bed asking for his breakfast. No newcomplaints as of this morning. Says his right chest wall continues to hurt where his ribs are broken.   Objective: Temp:  [97.9 F (36.6 C)-98.4 F (36.9 C)] 98.4 F (36.9 C) (09/18 0439) Pulse Rate:  [72-80] 76 (09/18 0439) Resp:  [12-20] 18 (09/18 0439) BP: (100-118)/(62-84) 100/69 (09/18 0439) SpO2:  [90 %-95 %] 95 % (09/18 0926) Weight:  [215 lb (97.5 kg)-216 lb (98 kg)] 216 lb (98 kg) (09/18 0439) Physical Exam: General: obese, well nourished, well developed, in no acute distress with non-toxic appearance HEENT: normocephalic, atraumatic, moist mucous membranes Neck: supple, non-tender without lymphadenopathy CV: regular rate and rhythm without murmurs, rubs, or gallops Lungs: upper airways sounds bilaterally with normal work of breathing, no wheeze appreciated Abdomen: soft, non-tender, no masses or organomegaly palpable, normoactive bowel sounds Skin: warm, dry, no rashes or lesions, cap refill < 2 seconds Extremities: warm and well perfused, normal tone  Laboratory:  Recent Labs Lab 10/29/15 1337 11/02/15 1654  WBC 9.2 9.0  HGB 16.2 15.6  HCT 49.9 47.5  PLT 273 269    Recent Labs Lab 10/29/15 1337 11/02/15 1654 11/03/15 0305  NA 140 143 140  K 3.6 3.4* 3.3*  CL 107 108 105  CO2 26 28 26   BUN 10 18 14   CREATININE 1.42* 1.04 1.11  CALCIUM 9.2 8.7* 8.4*  PROT  --  7.4 6.3*  BILITOT  --  1.2 0.7  ALKPHOS  --  75 70  ALT  --  20 19  AST  --  32 30  GLUCOSE 112* 87 101*    Imaging/Diagnostic Tests: Dg Chest 2 View  Result Date: 11/02/2015 CLINICAL DATA:  Cough. EXAM: CHEST  2 VIEW COMPARISON:  10/29/2015 chest radiograph. FINDINGS: Stable cardiomediastinal silhouette with mild cardiomegaly and  aortic atherosclerosis. No pneumothorax. No pleural effusion. No pulmonary edema. Patchy opacity in both lower lobes, not appreciably changed. Stable eventration anterior right hemidiaphragm. IMPRESSION: 1. Stable mild cardiomegaly without overt pulmonary edema . 2. Stable mild patchy bilateral lower lobe opacity, nonspecific, favor atelectasis, cannot exclude a component of aspiration or pneumonia. Electronically Signed   By: Ilona Sorrel M.D.   On: 11/02/2015 16:49   Dg Ribs Unilateral W/chest Right  Result Date: 11/02/2015 CLINICAL DATA:  Right rib pain after fall. EXAM: RIGHT RIBS AND CHEST - 3+ VIEW COMPARISON:  Chest radiograph from earlier today. FINDINGS: There are acute mildly displaced fractures of the anterior right sixth and seventh ribs. No additional fracture. No suspicious focal osseous lesion. Stable eventration of the anterior right hemidiaphragm with patchy bibasilar lung opacities. No overt pneumothorax on these three views. IMPRESSION: 1. Acute mildly displaced anterior right sixth and seventh rib fractures. No overt pneumothorax on these rib views . 2. Stable eventration of the anterior right hemidiaphragm with patchy nonspecific bibasilar lung opacities, favor atelectasis, cannot exclude a component of aspiration or pneumonia. Electronically Signed   By: Ilona Sorrel M.D.   On: 11/02/2015 16:54   Ladd Bing, DO 11/03/2015, 9:56 AM PGY-1, [place]  [place] Intern pager: [phone], text pages welcome

## 2015-11-03 NOTE — Progress Notes (Signed)
Asked to see patient regarding recent fall, Right radius fracture.  Pt is already splinted, and has been referred to ? Dr. Burney Gauze who was on call when patient was seen in the ER.  Pt should continue splint, nwb, elevation, and make an appointment with Dr. Burney Gauze for fracture care.

## 2015-11-03 NOTE — Evaluation (Signed)
Physical Therapy Evaluation Patient Details Name: Patrick Holt MRN: [id number] DOB: 23-Oct-1950 Today's Date: 11/03/2015   History of Present Illness  Patrick Holt is a 65 y.o. male presenting with hypoxia and cough, found to have rib fracture s/p fall.  Pt also with R wrist fx from fall on 9/13 in which he was supposed to schedule ortho appointment, but per pt has not been able to get appointment. PMH is significant for COPD, AFib (on Amiodarone; not candidate for anticoagulation due to hx of subdural hematoma), HFpEF , HTN, CAD, Depression/Anxiety/Schizoprenia, GERD, hx of seizure do, diverticulosis..    Clinical Impression  Pt admitted with above diagnosis. Pt currently with functional limitations due to the deficits listed below (see PT Problem List). Pt reports 8 falls in 5 days and has poor insight into his ability to care for himself at this time.   Recommend SNF, although pt states he does not want to go to a SNF.  If he refuses, then would need HHPT and need to maximize all home health services, but do not feel that is a safe discharge plan.  Until clarification from MD, will keep R UE as NWB from recent R wrist fx.  Pt reports he has been unable to make an appointment for follow up with ortho. Pt easily distracted during session. Pt will benefit from skilled PT to increase their independence and safety with mobility to allow discharge to the venue listed below.       Follow Up Recommendations SNF;Supervision for mobility/OOB    Equipment Recommendations  None recommended by PT    Recommendations for Other Services       Precautions / Restrictions Precautions Precautions: Fall Precaution Comments: 8 falls in 5 days, R wrist fx, R 6th & 7th rib fxs Restrictions Weight Bearing Restrictions: Yes RUE Weight Bearing: Non weight bearing (no orders from MD, but educated on maintaining NWB until clarification from MD) Other Position/Activity Restrictions: awaiting clarification  orders on R UE WB status.      Mobility  Bed Mobility Overal bed mobility: Needs Assistance Bed Mobility: Supine to Sit     Supine to sit: HOB elevated;Mod assist     General bed mobility comments: Pt needed MOD A with heavy cueing to not push through R UE.  Used bed pad to get hips turned and A with HOB elevated to get trunk upright.  Transfers Overall transfer level: Needs assistance Equipment used: Straight cane Transfers: Sit to/from [facility] Sit to Stand: Min assist Stand pivot transfers: Min assist       General transfer comment: Stood with MIN A and cues to not push up with R UE.  Pt able to do SPT with MIN A and cane, but grimacing and reports weakness and some soreness. He did not feel he could ambulate farther.  Ambulation/Gait             General Gait Details: SPT with MIN A only.  Pt felt too weak to attempt ambulation.  Stairs            Wheelchair Mobility    Modified Rankin (Stroke Patients Only)       Balance Overall balance assessment: History of Falls (8 falls in 5 days)                                           Pertinent  Vitals/Pain Pain Assessment: Faces Faces Pain Scale: Hurts little more Pain Location: Unable to specify Pain Descriptors / Indicators: Grimacing Pain Intervention(s): Monitored during session;Repositioned    Home Living Family/patient expects to be discharged to::  ("Retirement home" ) Living Arrangements: Alone   Type of Home: Apartment Home Access: Level entry;Elevator     Home Layout: One level Home Equipment: Financial trader - single point      Prior Function Level of Independence: Independent with assistive device(s)         Comments: Amb with cane in apartment and uses electric scooter outside of apartment     Hand Dominance   Dominant Hand: Right    Extremity/Trunk Assessment   Upper Extremity Assessment: Defer to OT evaluation            Lower Extremity Assessment: Generalized weakness (scabs noted on B knees, pt reports it is from scooting on ground after recent fall)      Cervical / Trunk Assessment: Normal  Communication      Cognition Arousal/Alertness: Awake/alert Behavior During Therapy: WFL for tasks assessed/performed;Restless (easily distracted) Overall Cognitive Status: No family/caregiver present to determine baseline cognitive functioning Area of Impairment: Safety/judgement         Safety/Judgement: Decreased awareness of deficits;Decreased awareness of safety     General Comments: Per medical chart pt does have history of mental illness.     General Comments General comments (skin integrity, edema, etc.): scabs B knees    Exercises     Assessment/Plan    PT Assessment Patient needs continued PT services  PT Problem List Decreased strength;Decreased activity tolerance;Decreased balance;Decreased mobility;Decreased safety awareness          PT Treatment Interventions DME instruction;Gait training;Functional mobility training;Balance training;Therapeutic exercise;Therapeutic activities    PT Goals (Current goals can be found in the Care Plan section)  Acute Rehab PT Goals Patient Stated Goal: to go home PT Goal Formulation: With patient Time For Goal Achievement: 11/17/15 Potential to Achieve Goals: Fair    Frequency Min 3X/week   Barriers to discharge Decreased caregiver support      Co-evaluation               End of Session Equipment Utilized During Treatment: Gait belt Activity Tolerance: Patient limited by fatigue Patient left: in chair;with call bell/phone within reach;with chair alarm set Nurse Communication: Mobility status    Functional Assessment Tool Used: clinical judgement and objective findings of bed mobility Functional Limitation: Changing and maintaining body position Changing and Maintaining Body Position Current Status [id number]): At least 40 percent  but less than 60 percent impaired, limited or restricted Changing and Maintaining Body Position Goal Status [id number]): At least 1 percent but less than 20 percent impaired, limited or restricted    Time: 0851-0916 PT Time Calculation (min) (ACUTE ONLY): 25 min   Charges:   PT Evaluation $PT Eval Moderate Complexity: 1 Procedure PT Treatments $Therapeutic Activity: 8-22 mins   PT G Codes:   PT G-Codes **NOT FOR INPATIENT CLASS** Functional Assessment Tool Used: clinical judgement and objective findings of bed mobility Functional Limitation: Changing and maintaining body position Changing and Maintaining Body Position Current Status [id number]): At least 40 percent but less than 60 percent impaired, limited or restricted Changing and Maintaining Body Position Goal Status [id number]): At least 1 percent but less than 20 percent impaired, limited or restricted    [facility] LUBECK 11/03/2015, 9:39 AM

## 2015-11-03 NOTE — Clinical Social Work Note (Signed)
Per nurse tech and RN, patient is declining SNF but does not appear to have ultimate capacity to make decisions. He is alert and oriented x 4. Discussed with MD who agrees. Psych consult to follow to determine.  Dayton Scrape, [facility]

## 2015-11-04 ENCOUNTER — Observation Stay (HOSPITAL_COMMUNITY): Payer: Medicare Other

## 2015-11-04 DIAGNOSIS — S52613D Displaced fracture of unspecified ulna styloid process, subsequent encounter for closed fracture with routine healing: Secondary | ICD-10-CM

## 2015-11-04 DIAGNOSIS — R296 Repeated falls: Secondary | ICD-10-CM | POA: Diagnosis not present

## 2015-11-04 DIAGNOSIS — I10 Essential (primary) hypertension: Secondary | ICD-10-CM | POA: Diagnosis not present

## 2015-11-04 DIAGNOSIS — S52351A Displaced comminuted fracture of shaft of radius, right arm, initial encounter for closed fracture: Secondary | ICD-10-CM | POA: Diagnosis not present

## 2015-11-04 DIAGNOSIS — R0902 Hypoxemia: Secondary | ICD-10-CM | POA: Diagnosis not present

## 2015-11-04 DIAGNOSIS — R262 Difficulty in walking, not elsewhere classified: Secondary | ICD-10-CM

## 2015-11-04 DIAGNOSIS — S62101A Fracture of unspecified carpal bone, right wrist, initial encounter for closed fracture: Secondary | ICD-10-CM

## 2015-11-04 LAB — BASIC METABOLIC PANEL
Anion gap: 6 (ref 5–15)
BUN: 14 mg/dL (ref 6–20)
CALCIUM: 8.5 mg/dL — AB (ref 8.9–10.3)
CHLORIDE: 110 mmol/L (ref 101–111)
CO2: 26 mmol/L (ref 22–32)
CREATININE: 1.08 mg/dL (ref 0.61–1.24)
GFR calc non Af Amer: >60 mL/min (ref >60)
Glucose, Bld: 137 mg/dL — ABNORMAL HIGH (ref 65–99)
Potassium: 4.2 mmol/L (ref 3.5–5.1)
SODIUM: 142 mmol/L (ref 135–145)

## 2015-11-04 LAB — CBC
HCT: 40.8 % (ref 39.0–52.0)
Hemoglobin: 12.8 g/dL — ABNORMAL LOW (ref 13.0–17.0)
MCH: 29 pg (ref 26.0–34.0)
MCHC: 31.4 g/dL (ref 30.0–36.0)
MCV: 92.3 fL (ref 78.0–100.0)
PLATELETS: 284 10*3/uL (ref 150–400)
RBC: 4.42 MIL/uL (ref 4.22–5.81)
RDW: 15.3 % (ref 11.5–15.5)
WBC: 10.5 10*3/uL (ref 4.0–10.5)

## 2015-11-04 NOTE — Clinical Social Work Placement (Signed)
   CLINICAL SOCIAL WORK PLACEMENT  NOTE  Date:  11/04/2015  Patient Details  Name: Patrick Holt MRN: [id number] Date of Birth: 05-05-1950  Clinical Social Work is seeking post-discharge placement for this patient at the [place] level of care (*CSW will initial, date and re-position this form in  chart as items are completed):  Yes   Patient/family provided with [facility]'s list of facilities offering this level of care within the geographic area requested by the patient (or if unable, by the patient's family).  Yes   Patient/family informed of their freedom to choose among providers that offer the needed level of care, that participate in Medicare, Medicaid or managed care program needed by the patient, have an available bed and are willing to accept the patient.  Yes   Patient/family informed of [facility]'s ownership interest in [facility] and [facility], as well as of the fact that they are under no obligation to receive care at these facilities.  PASRR submitted to EDS on 11/04/15     PASRR number received on       Existing PASRR number confirmed on       FL2 transmitted to all facilities in geographic area requested by pt/family on 11/04/15     FL2 transmitted to all facilities within larger geographic area on       Patient informed that his/her managed care company has contracts with or will negotiate with certain facilities, including the following:            Patient/family informed of bed offers received.  Patient chooses bed at       Physician recommends and patient chooses bed at      Patient to be transferred to   on  .  Patient to be transferred to facility by       Patient family notified on   of transfer.  Name of family member notified:        PHYSICIAN Please sign FL2     Additional Comment:    _______________________________________________ Candie Chroman, LCSW 11/04/2015, 12:34  PM

## 2015-11-04 NOTE — Progress Notes (Signed)
Advance Beneficiary Notice of Noncoverage given to patient as requested. Letter explained to the patient in detail, all questions answered.Patrick Holt [facility] [phone]

## 2015-11-04 NOTE — Evaluation (Signed)
Occupational Therapy Evaluation Patient Details Name: Patrick Holt MRN: [id number] DOB: 12-22-1950 Today's Date: 11/04/2015    History of Present Illness Patrick Holt is a 65 y.o. male presenting with hypoxia and cough, found to have rib fracture s/p fall.  Pt also with R wrist fx from fall on 9/13 in which he was supposed to schedule ortho appointment, but per pt has not been able to get appointment. PMH is significant for COPD, AFib (on Amiodarone; not candidate for anticoagulation due to hx of subdural hematoma), HFpEF , HTN, CAD, Depression/Anxiety/Schizoprenia, GERD, hx of seizure do, diverticulosis..   Clinical Impression   Pt with recent hx of multiple falls and now with current R wrist and rib fxs presents with impaired cognition and insight into deficits, generalized weakness, poor balance and baseline visual impairment. He does not have functional use of his dominant hand. Pt requires min to total assist for ADL and is not able to mobilize independently. He is unsafe to return home alone. Recommending SNF.    Follow Up Recommendations  SNF;Supervision/Assistance - 24 hour    Equipment Recommendations       Recommendations for Other Services       Precautions / Restrictions Precautions Precautions: Fall Precaution Comments: 8 falls in 5 days, R wrist fx, R 6th & 7th rib fxs Required Braces or Orthoses: Other Brace/Splint Other Brace/Splint: splint on R UE Restrictions Weight Bearing Restrictions: Yes RUE Weight Bearing: Non weight bearing Other Position/Activity Restrictions: per MD note, pt NWB      Mobility Bed Mobility Overal bed mobility: Needs Assistance Bed Mobility: Supine to Sit     Supine to sit: Min assist;HOB elevated     General bed mobility comments: increased time, physical assist to avoid using R UE to push  Transfers Overall transfer level: Needs assistance Equipment used: 1 person hand held assist Transfers: Sit to/from Stand Sit  to Stand: Min assist Stand pivot transfers: Min assist       General transfer comment: cues to not push up on R UE, assist to steady    Balance                                            ADL Overall ADL's : Needs assistance/impaired Eating/Feeding: Set up;Sitting Eating/Feeding Details (indicate cue type and reason): assist to set up tray, feeds with L hand Grooming: Wash/dry face;Wash/dry hands;Brushing hair;Minimal assistance;Total assistance;Sitting Grooming Details (indicate cue type and reason): could not problem solve how to comb hair with L hand Upper Body Bathing: Moderate assistance;Sitting   Lower Body Bathing: Total assistance;Sit to/from stand   Upper Body Dressing : Moderate assistance;Sitting   Lower Body Dressing: Total assistance;Sit to/from stand   Toilet Transfer: Minimal assistance;Stand-pivot;BSC   Toileting- Clothing Manipulation and Hygiene: Total assistance;Sit to/from stand         General ADL Comments: Pt with poor insight into obstacles to going home, yet is resistant to placement.     Vision Additional Comments: no functional vision in R eye   Perception     Praxis      Pertinent Vitals/Pain Pain Assessment: Faces Faces Pain Scale: Hurts a little bit Pain Location: R hand, inconsistent response Pain Descriptors / Indicators: Grimacing;Guarding Pain Intervention(s): Monitored during session;Repositioned     Hand Dominance Right   Extremity/Trunk Assessment Upper Extremity Assessment Upper Extremity Assessment: RUE deficits/detail RUE  Deficits / Details: full PROM of digits and AROM of shoulder with encouragement to move, splinted from MPs to proximal of elbow RUE: Unable to fully assess due to immobilization RUE Coordination: decreased fine motor;decreased gross motor   Lower Extremity Assessment Lower Extremity Assessment: Defer to PT evaluation   Cervical / Trunk Assessment Cervical / Trunk Assessment:  Normal   Communication Communication Communication: No difficulties   Cognition Arousal/Alertness: Awake/alert Behavior During Therapy: Flat affect (perseverates on his medical hx) Overall Cognitive Status: No family/caregiver present to determine baseline cognitive functioning Area of Impairment: Safety/judgement;Problem solving;Memory     Memory: Decreased recall of precautions;Decreased short-term memory   Safety/Judgement: Decreased awareness of deficits;Decreased awareness of safety   Problem Solving: Slow processing;Decreased initiation;Difficulty sequencing;Requires verbal cues;Requires tactile cues General Comments: Per medical chart pt does have history of mental illness.    General Comments       Exercises       Shoulder Instructions      Home Living Family/patient expects to be discharged to:: Private residence Living Arrangements: Alone   Type of Home: Apartment Home Access: Level entry;Elevator     Home Layout: One level               Home Equipment: Financial trader - single point   Additional Comments: describes home as [place]      Prior Functioning/Environment Level of Independence: Independent with assistive device(s)        Comments: Amb with cane in apartment and uses electric scooter outside of apartment        OT Problem List: Decreased strength;Decreased range of motion;Decreased activity tolerance;Impaired balance (sitting and/or standing);Impaired vision/perception;Decreased coordination;Decreased cognition;Decreased safety awareness;Decreased knowledge of use of DME or AE;Pain;Impaired UE functional use;Increased edema   OT Treatment/Interventions: Self-care/ADL training;Therapeutic exercise;DME and/or AE instruction;Therapeutic activities;Balance training;Patient/family education;Cognitive remediation/compensation    OT Goals(Current goals can be found in the care plan section) Acute Rehab OT Goals Patient Stated  Goal: to go home OT Goal Formulation: With patient Time For Goal Achievement: 11/18/15 Potential to Achieve Goals: Fair ADL Goals Pt Will Perform Grooming: with min assist;standing Pt Will Perform Upper Body Bathing: with min assist;sitting Pt Will Perform Upper Body Dressing: with min assist;sitting Pt Will Transfer to Toilet: with min assist;ambulating;bedside commode (over toilet) Pt Will Perform Toileting - Clothing Manipulation and hygiene: with min assist;sit to/from stand Pt/caregiver will Perform Home Exercise Program: Right Upper extremity;With Supervision (AROM-digits and shoulder) Additional ADL Goal #1: Pt will state edema management techniques for R UE and adhere to NWB status.  OT Frequency: Min 2X/week   Barriers to D/C: Decreased caregiver support          Co-evaluation              End of Session Nurse Communication:  (requested incentive spiromter)  Activity Tolerance:   Patient left: in chair;with call bell/phone within reach;with chair alarm set;with nursing/sitter in room   Time: [id number] OT Time Calculation (min): 36 min Charges:  OT General Charges $OT Visit: 1 Procedure OT Evaluation $OT Eval Moderate Complexity: 1 Procedure OT Treatments $Self Care/Home Management : 8-22 mins G-Codes:    Malka So 11/04/2015, 9:24 AM  [phone]

## 2015-11-04 NOTE — Care Management Obs Status (Signed)
[place] NOTIFICATION   Patient Details  Name: Patrick Holt MRN: [id number] Date of Birth: 1951-02-02   Medicare Observation Status Notification Given:  Yes (Patient unable to sign letter due to broken right hand; pt marked X; Letter explained in detail; all questions answered)    Royston Bake, RN 11/04/2015, 4:08 PM

## 2015-11-04 NOTE — Progress Notes (Signed)
Family Medicine Teaching Service Daily Progress Note Intern Pager: [phone]  Patient name: Patrick Holt Medical record number: [id number] Date of birth: [birth year] Age: 65 y.o. Gender: male  Primary Care Provider: [name], MD Consultants: None Code Status: FULL  Pt Overview and Major Events to Date:  9/17: admit for hypoxia in the context of right rib 6 & 7 fx  Assessment and Plan: Patrick Holt is a 65 y.o. male presenting with hypoxia and cough, found to have rib fracture s/p fall . PMH is significant for COPD, AFib (on Amiodarone; not candidate for anticoagulation due to hx of subdural hematoma), HFpEF , HTN, CAD, Depression/Anxiety/Schizoprenia, GERD, hx of seizure do, diverticulosis.  1. Hypoxia: In setting of cough and anterior right 6th and 7th rib fractures. Required 3L O2 in ED to maintain appropatie O2 saturations. Patient has history of COPD; is not on home O2. CXR with patchy bilateral lower lobe opacity (appears fairly similar to CXR from 9/13) which was read as nonspecific, favoring atelectasis, but unable to exclude a component of aspiration or PNA. Suspect atelectasis secondary to lack of deep inspiration in setting of rib fractures as likely cause of hypoxia. May have mild component of COPD exacerbation as well given new onset cough.  Patient has remained afebrile and without leukocytosis during admission, so less suspicious for PNA. Well's score of 0 so low concern for PE as cause of hypoxia.  - Admit to telemetry, vital signs per unit with pulse ox checks  - Azithro 500 mg PO (9/18-9/20), s/p CTX and Azithromycin (9/17) - Supplemental O2 prn, wean as tolerated  - Duonebs q6h/albuterol q4h prn  - Prednisone 50 mg daily (9/18-9/22) - Incentive spirometer  - Tylenol and ibuprofen prn pain, avoiding narcotics due to substance abuse history and falls, consider adding Voltaren gel if pain control becomes an issue   2. Right Distal Radial Fracture: S/p mechanical  fall on 9/13. Splint placed for wrist fracture at ED visit. Patient to follow up with hand surgery but reports difficulty with establishing appointment.  - Help to facilitate follow up outpatient for radial fracture upon discharge - Was seen by orthopedics, no recommendation for pinning of fx, f/u outpatient  3. Recurrent Falls: Patient reports history of recurrent falls. Most recently fell on 9/13. Patient reports hitting head during that fall. CT head without acute findings on 9/13.  - Fall precautions  - UDS pos for benzos, on xanax during hospitalization - Low threshold to re-image head if patient has change in mental status or neuro exam  - Avoid narcotics given recurrent falls  - Continue meclizine prn  - PT/OT recommend SNF for supervision mobility  4. COPD: has history of COPD; home med Combivent. PFT 2014: FEV1 96%, FEV1/FVC 66%. Patient hospitalized in November 2016 for COPD exacerbation.  - Plan as noted above - Holding Combivent due to [employer]  - Outpatient consider additional controller medication such as LABA +/- ICS   5. AFib: In sinus rhythm on ED EKG and rate controlled.Not a candidate for anticoagulation due to "tiny bilateral frontal subarachnoid or subdural hemorrhages without significant trauma" seen on MRI 02/04/2012. Patient does not appear to be on BB. Upon chart review, patient recently requested metoprolol refill but was denied by PCP due to not being on medication for several years; PCP recommended f/u with cardiology regarding BB. EKG NSR w/o ST changes. - Continue home Amiodarone 100 mg qd  6. HTN: Blood pressures have been stable. 101-105/59-81. - Continue home amlodipine  10 mg daily   7. CAD: ASA and Lipitor   8. Depression/Anxiety/Schizoprenia: Continue Depakote  9. Hx of Seizure: Continue Keppra 500mg  BID  10. GERD: Continue Protonix  11. Parkinsonism: Continue requip   12. Chronic Diarrhea: Continue Imodium PRN  13. Urge Incontinence  : - Discontinue oxybutynin - Consider alternate therapy given this medications anti-cholinergic properties   FEN/GI: Soft Diet (patient without dentures), NS at 100 cc/hr x12h then SLIV  Prophylaxis: Lovenox   Disposition: awaiting improvement on weaning O2, patient agreeable to SNF per PT recs  Subjective:  Patient in chair talking to RN upon entering room. Continues to refuse SNF placement. Discussed we would help facilitate ortho f/u. Patient is frustrated about not getting surgery on R wrist. No new complaints. R chest wall pain is much improved.  Objective: Temp:  [97.5 F (36.4 C)-97.9 F (36.6 C)] 97.9 F (36.6 C) (09/19 0452) Pulse Rate:  [71-79] 71 (09/19 0452) Resp:  [18] 18 (09/19 0452) BP: (101-105)/(59-81) 101/59 (09/19 0452) SpO2:  [85 %-97 %] 94 % (09/19 0452) Weight:  [220 lb 4.8 oz (99.9 kg)] 220 lb 4.8 oz (99.9 kg) (09/19 0452) Physical Exam: General: obese, well nourished, well developed, in no acute distress with non-toxic appearance HEENT: normocephalic, atraumatic, moist mucous membranes Neck: supple, non-tender without lymphadenopathy CV: regular rate and rhythm without murmurs, rubs, or gallops Lungs: upper airways sounds bilaterally with normal work of breathing, no wheeze appreciated Abdomen: soft, non-tender, no masses or organomegaly palpable, normoactive bowel sounds Skin: warm, dry, no rashes or lesions, cap refill < 2 seconds Extremities: warm and well perfused, normal tone  Laboratory:  Recent Labs Lab 10/29/15 1337 11/02/15 1654 11/04/15 0243  WBC 9.2 9.0 10.5  HGB 16.2 15.6 12.8*  HCT 49.9 47.5 40.8  PLT 273 269 284    Recent Labs Lab 11/02/15 1654 11/03/15 0305 11/04/15 0243  NA 143 140 142  K 3.4* 3.3* 4.2  CL 108 105 110  CO2 28 26 26   BUN 18 14 14   CREATININE 1.04 1.11 1.08  CALCIUM 8.7* 8.4* 8.5*  PROT 7.4 6.3*  --   BILITOT 1.2 0.7  --   ALKPHOS 75 70  --   ALT 20 19  --   AST 32 30  --   GLUCOSE 87 101* 137*    Troponin:  Neg PT/INR:  13.4/1.02 UDS:  benzos (on xanax)  Imaging/Diagnostic Tests: Dg Chest 2 View: Result Date: 11/02/2015 CLINICAL DATA:  Cough. EXAM: CHEST  2 VIEW COMPARISON:  10/29/2015 chest radiograph. FINDINGS: Stable cardiomediastinal silhouette with mild cardiomegaly and aortic atherosclerosis. No pneumothorax. No pleural effusion. No pulmonary edema. Patchy opacity in both lower lobes, not appreciably changed. Stable eventration anterior right hemidiaphragm. IMPRESSION: 1. Stable mild cardiomegaly without overt pulmonary edema . 2. Stable mild patchy bilateral lower lobe opacity, nonspecific, favor atelectasis, cannot exclude a component of aspiration or pneumonia. Electronically Signed   By: Ilona Sorrel M.D.   On: 11/02/2015 16:49   Dg Ribs Unilateral W/chest Right: Result Date: 11/02/2015 CLINICAL DATA:  Right rib pain after fall. EXAM: RIGHT RIBS AND CHEST - 3+ VIEW COMPARISON:  Chest radiograph from earlier today. FINDINGS: There are acute mildly displaced fractures of the anterior right sixth and seventh ribs. No additional fracture. No suspicious focal osseous lesion. Stable eventration of the anterior right hemidiaphragm with patchy bibasilar lung opacities. No overt pneumothorax on these three views. IMPRESSION: 1. Acute mildly displaced anterior right sixth and seventh rib fractures. No overt pneumothorax on  these rib views . 2. Stable eventration of the anterior right hemidiaphragm with patchy nonspecific bibasilar lung opacities, favor atelectasis, cannot exclude a component of aspiration or pneumonia. Electronically Signed   By: Ilona Sorrel M.D.   On: 11/02/2015 16:54     [place] Bing, DO 11/04/2015, 7:06 AM PGY-1, [place] Intern pager: [phone], text pages welcome

## 2015-11-04 NOTE — NC FL2 (Signed)
[place] LEVEL OF CARE SCREENING TOOL     IDENTIFICATION  Patient Name: Patrick Holt Birthdate: [birth year] Sex: male Admission Date (Current Location): 11/02/2015  [facility] and [place] Number:  Herbalist and Address:  [facility] [place]. [facility], [place] [address] [place], [place] [address]      Provider Number: [id number]  Attending Physician Name and Address:  Blane Ohara McDiarmid, MD  Relative Name and Phone Number:       Current Level of Care: Hospital Recommended Level of Care: [place] Prior Approval Number:    Date Approved/Denied:   PASRR Number: Pending  Discharge Plan: SNF    Current Diagnoses: Patient Active Problem List   Diagnosis Date Noted  . Right wrist fracture   . Closed displaced fracture of ulnar styloid with routine healing   . Scapholunate ligament injury with no instability   . Falls frequently   . Hypoxia 11/02/2015  . Anxiety 06/06/2015  . Impaired mobility 04/28/2015  . Lumbar spondylosis with myelopathy 03/07/2015  . Nocturia 12/03/2014  . Chronic venous insufficiency 09/23/2014  . Poor social situation 07/30/2014  . Schizophrenia, acute ([place])   . Schizoaffective disorder, unspecified type ([place])   . Decreased visual acuity 06/27/2014  . COPD exacerbation ([place])   . Right sided weakness 01/04/2014  . Essential hypertension, benign   . Atrial fibrillation, unspecified   . Radiculopathy of cervical region   . Polyuria 12/07/2013  . Erectile dysfunction 12/07/2013  . Diverticulitis 08/01/2013  . Acute diverticulitis 08/01/2013  . Hematochezia 06/04/2013  . Hypokalemia 10/03/2012  . Benign neoplasm of colon 09/27/2012  . Stricture and stenosis of esophagus 09/27/2012  . Diverticulitis of colon without hemorrhage 07/26/2012  . Polypharmacy 03/26/2012  . Seizures ([place]) 02/10/2012  . Atrial fibrillation ([place]) 02/05/2012  . Coronary atherosclerosis of native coronary artery 02/05/2012  .  Radicular low back pain 03/09/2011  . History of seizure disorder 01/29/2011  . Irritable bowel syndrome (IBS) 06/24/2010  . Urge incontinence 06/19/2010  . TOBACCO USER 11/23/2008  . DEPRESSION, MAJOR 05/31/2008  . ISCHEMIC HEART DISEASE 05/31/2008  . RAYNAUD'S DISEASE 05/31/2008  . HIATAL HERNIA 05/31/2008  . Rheumatoid arthritis ([place]) 05/31/2008  . FIBROMYALGIA 05/31/2008  . Hyperlipidemia 01/26/2008  . Anxiety state 01/26/2008  . HEMORRHOIDS, INTERNAL 01/26/2008  . COPD (chronic obstructive pulmonary disease) ([place]) 01/26/2008  . ESOPHAGEAL MOTILITY DISORDER 01/26/2008  . GERD 01/26/2008  . Dermatomyositis ([place]) 01/26/2008    Orientation RESPIRATION BLADDER Height & Weight     Self, Time, Situation, Place  Normal Continent Weight: 220 lb 4.8 oz (99.9 kg) (scale b) Height:  5\' 7"  (170.2 cm)  BEHAVIORAL SYMPTOMS/MOOD NEUROLOGICAL BOWEL NUTRITION STATUS   (None) Convulsions/Seizures Continent Diet (Soft)  AMBULATORY STATUS COMMUNICATION OF NEEDS Skin   Limited Assist Verbally Skin abrasions, Bruising                       Personal Care Assistance Level of Assistance  Bathing, Feeding, Dressing Bathing Assistance: Maximum assistance (Total care) Feeding assistance: Independent Dressing Assistance: Maximum assistance (Total care)     Functional Limitations Info  Sight, Hearing, Speech Sight Info: Adequate Hearing Info: Adequate Speech Info: Adequate    SPECIAL CARE FACTORS FREQUENCY  PT (By licensed PT), OT (By licensed OT), Blood pressure     PT Frequency: 5 x week OT Frequency: 5 x week            Contractures Contractures Info: Not  present    Additional Factors Info  Code Status, Allergies, Psychotropic Code Status Info: Full Allergies Info: Immune Golbulins, CiproCiprofloxacin, Flagyl Metronidazole, Lisinopril, Sulfa Antibiotics, Requip Ropinirole Hcl, Betamethasone Dipropionate, Bupropion Hcl, Clobetasol, Codeine, Escitalopram Oxalate, Fluoxetine  Hcl, Fluticasone-salmeterol, Furosemide, Paroxetine, Penicillins, Tacrolimus, Tetanus Toxoid, Tuberculin Purified Protein Derivative Psychotropic Info: Anxiety, Depression, Schizophrenia, Schizoaffective Disorder: Depakote ER 500 mg PO daily, Xanax 0.5 mg PO TID prn         Current Medications (11/04/2015):  This is the current hospital active medication list Current Facility-Administered Medications  Medication Dose Route Frequency Provider Last Rate Last Dose  . acetaminophen (TYLENOL) tablet 650 mg  650 mg Oral Q6H PRN Nicolette Bang, DO       Or  . acetaminophen (TYLENOL) suppository 650 mg  650 mg Rectal Q6H PRN Nicolette Bang, DO      . albuterol (PROVENTIL) (2.5 MG/3ML) 0.083% nebulizer solution 2.5 mg  2.5 mg Inhalation Q4H PRN Nicolette Bang, DO      . ALPRAZolam Duanne Moron) tablet 0.5 mg  0.5 mg Oral TID PRN Nicolette Bang, DO   0.5 mg at 11/04/15 0026  . amiodarone (PACERONE) tablet 100 mg  100 mg Oral Daily Nicolette Bang, DO   100 mg at 11/04/15 1100  . amLODipine (NORVASC) tablet 10 mg  10 mg Oral Daily Nicolette Bang, DO   10 mg at 11/04/15 1100  . aspirin EC tablet 81 mg  81 mg Oral Daily Nicolette Bang, DO   81 mg at 11/04/15 1100  . atorvastatin (LIPITOR) tablet 40 mg  40 mg Oral q1800 Nicolette Bang, DO   40 mg at 11/03/15 1811  . azithromycin (ZITHROMAX) tablet 500 mg  500 mg Oral Daily Steve Rattler, DO   500 mg at 11/04/15 1100  . bisacodyl (DULCOLAX) EC tablet 5 mg  5 mg Oral Daily PRN Nicolette Bang, DO   5 mg at 11/03/15 1639  . divalproex (DEPAKOTE ER) 24 hr tablet 500 mg  500 mg Oral Daily Nicolette Bang, DO   500 mg at 11/04/15 1113  . enoxaparin (LOVENOX) injection 40 mg  40 mg Subcutaneous Daily Nicolette Bang, DO   40 mg at 11/04/15 1100  . guaiFENesin (MUCINEX) 12 hr tablet 600 mg  600 mg Oral BID Nicolette Bang, DO   600 mg at 11/04/15 1100  .  ibuprofen (ADVIL,MOTRIN) tablet 400 mg  400 mg Oral Q6H PRN Nicolette Bang, DO   400 mg at 11/04/15 1114  . ipratropium-albuterol (DUONEB) 0.5-2.5 (3) MG/3ML nebulizer solution 3 mL  3 mL Nebulization TID Blane Ohara McDiarmid, MD   3 mL at 11/03/15 2008  . levETIRAcetam (KEPPRA) tablet 500 mg  500 mg Oral BID Nicolette Bang, DO   500 mg at 11/04/15 1113  . loperamide (IMODIUM) capsule 2-4 mg  2-4 mg Oral TID PRN Nicolette Bang, DO      . meclizine (ANTIVERT) tablet 25 mg  25 mg Oral TID PRN Nicolette Bang, DO      . pantoprazole (PROTONIX) EC tablet 40 mg  40 mg Oral Daily Nicolette Bang, DO   40 mg at 11/04/15 1113  . polyethylene glycol (MIRALAX / GLYCOLAX) packet 17 g  17 g Oral Daily PRN Nicolette Bang, DO      . predniSONE (DELTASONE) tablet 50 mg  50 mg Oral Q breakfast Nicolette Bang, DO   50 mg at 11/04/15 [phone]  Discharge Medications: Please see discharge summary for a list of discharge medications.  Relevant Imaging Results:  Relevant Lab Results:   Additional Information SS#: [id number]  Candie Chroman, LCSW

## 2015-11-04 NOTE — Discharge Summary (Signed)
[facility] Discharge Summary  Patient name: Patrick Holt Medical record number: [id number] Date of birth: 10-04-1950 Age: 65 y.o. Gender: male Date of Admission: 11/02/2015  Date of Discharge: 11/05/2015 Admitting Physician: Blane Ohara McDiarmid, MD  Primary Care Provider: [name], MD Consultants: Orthopedics  Indication for Hospitalization: Right Sided Chest Pain with Hypoxia  Discharge Diagnoses/Problem List:  COPD Right rib 6 & 7 fracture Right distal radial fracture Recurrent falls Urinary incontinence Parkinsonism Hypertension Atrial fibrillation  Disposition: SNF for PT ambulation  Discharge Condition: Stable, improved  Discharge Exam:  General: obese, well nourished, well developed, in no acute distress with non-toxic appearance HEENT: normocephalic, atraumatic, moist mucous membranes Neck: supple, non-tender without lymphadenopathy CV: regular rate and rhythm without murmurs, rubs, or gallops, mild R chest tenderness near rib 6-7 Lungs: upper airways soundsbilaterally with normal work of breathing, no wheeze appreciated, on RA Abdomen: soft, non-tender, no masses or organomegaly palpable, normoactive bowel sounds Skin: warm, dry, no rashes or lesions, cap refill < 2 seconds Extremities: warm and well perfused, normal tone  Brief Hospital Course:  Patrick Maggi Overcashis a 65 y.o.malepresenting with hypoxia and cough, found to have rib fracture s/p fallon 9/13. PMH is significant for COPD, AFib (on Amiodarone; not candidate for anticoagulation due to hx of subdural hematoma), HFpEF , HTN, CAD, Depression/Anxiety/Schizoprenia, GERD, hx of seizure do, diverticulosis.  Initially, patient presented to [facility] ED after sustaining a mechanical fall on 9/13 and reported hitting head during backwards fall w/o LOC. CT during ED visit was neg. During the fall he attempt to catch himself with his right arm causing a distal radial head fracture. CXR was  read neg initially. Splint was placed and patient was stable for discharge. He was told to f/u with orthopedic hand surgeon on discharge.  Patient returned to [facility] ED on 9/17 for complaints of R-sided chest pain worse with inspiration. He denied new h/o falls since previous ED visit. EKG showed NSR. Wells score was 0 so no PE workup was pursued. Repeat CXR was concerning for PNA but favoring atelectasis in the setting of COPD with R-sided rib fx at 6 and 7 consistent with previous CXR. He was found to be hypoxic and responded well to [place] and was successfully weaned down upon admission. He was given prednisone during hospitalization and 3 days of azithromycin 500 mg. Ortho was consulted about concern for R wrist fx but deemed him appropriate for outpt f/u. This f/u was facilitated by [place] upon discharge. PT rec SNF placement for PT 24 supervision due to fall risk. Patient was discharged to SNF.   Upon discharge, patient was stable with orthopedic and PCP f/u. He was also scheduled for neurology follow up for his falls and parkinsonism.  Issues for Follow Up:  1. Follow up with orthopedic for R wrist fracture 2. Follow up with PCP for hospitalization on 9/26 unless you are still at SNF, please call and cancel 3. Please discontinue oxybutynin as this has anticholinergic properties, please consider other options if necessary, pharmacy approved for decrease to 2-3 times daily 4. R sided chest pain should resolve with conservative care and pain mediation, will need to be reassessed after discharge by PCP 5. Consider LABA for COPD during PCP f/u 6. Follow up with neurology for your parkinsonism and h/o falls 7. Please decrease you xanax and limit use as this will increase risk of fall 8. We decreased amlodipine to 5 mg per pharmacy rec for fall risk 9. Completed  abx course, will need to finish prednisone 9/21-9/22  Significant Procedures: None  Significant Labs and Imaging:   Recent Labs Lab 11/02/15 1654  11/04/15 0243  WBC 9.0 10.5  HGB 15.6 12.8*  HCT 47.5 40.8  PLT 269 284    Recent Labs Lab 11/02/15 1654 11/03/15 0305 11/04/15 0243  NA 143 140 142  K 3.4* 3.3* 4.2  CL 108 105 110  CO2 28 26 26   GLUCOSE 87 101* 137*  BUN 18 14 14   CREATININE 1.04 1.11 1.08  CALCIUM 8.7* 8.4* 8.5*  ALKPHOS 75 70  --   AST 32 30  --   ALT 20 19  --   ALBUMIN 3.5 2.9*  --    Troponin:  Neg UDS:  Benzos (consistent with xanax use)  Results/Tests Pending at Time of Discharge: None  Discharge Medications:    Medication List    STOP taking these medications   oxybutynin 5 MG tablet Commonly known as:  DITROPAN     TAKE these medications   ALPRAZolam 0.5 MG tablet Commonly known as:  XANAX Take 1 tablet (0.5 mg total) by mouth 3 (three) times daily as needed for anxiety.   amiodarone 200 MG tablet Commonly known as:  PACERONE Take 0.5 tablets (100 mg total) by mouth daily.   amLODipine 5 MG tablet Commonly known as:  NORVASC Take 1 tablet (5 mg total) by mouth daily. What changed:  medication strength  how much to take   aspirin EC 81 MG tablet Take 1 tablet (81 mg total) by mouth daily.   atorvastatin 40 MG tablet Commonly known as:  LIPITOR Take 1 tablet (40 mg total) by mouth daily.   divalproex 250 MG 24 hr tablet Commonly known as:  DEPAKOTE ER Take 2 tablets (500 mg total) by mouth daily.   esomeprazole 40 MG capsule Commonly known as:  NEXIUM Take 1 capsule (40 mg total) by mouth daily at 12 noon.   guaiFENesin 600 MG 12 hr tablet Commonly known as:  MUCINEX Take 1 tablet (600 mg total) by mouth 2 (two) times daily. OTC   levETIRAcetam 500 MG tablet Commonly known as:  KEPPRA TAKE 1 TABLET(500 MG) BY MOUTH TWICE DAILY   loperamide 2 MG capsule Commonly known as:  IMODIUM TAKE 1 TO 2 CAPSULES BY MOUTH 3 TIMES A DAY AS NEEDED FOR DIARRHEA   meclizine 25 MG tablet Commonly known as:  ANTIVERT TAKE 1 TABLET (25 MG TOTAL) BY MOUTH 3 (THREE) TIMES  DAILY AS NEEDED. What changed:  how much to take  how to take this  when to take this  reasons to take this  additional instructions   multivitamin with minerals Tabs tablet Take 1 tablet by mouth daily.   naproxen 500 MG tablet Commonly known as:  NAPROSYN Take 1 tablet (500 mg total) by mouth 2 (two) times daily with a meal.   Potassium Chloride ER 20 MEQ Tbcr Take 1 tablet by mouth every other day.   predniSONE 50 MG tablet Commonly known as:  DELTASONE Take 1 tablet (50 mg total) by mouth daily with breakfast. Start taking on:  11/06/2015       Discharge Instructions: Please refer to Patient Instructions section of EMR for full details.  Patient was counseled important signs and symptoms that should prompt return to medical care, changes in medications, dietary instructions, activity restrictions, and follow up appointments.   Follow-Up Appointments:  Contact information for follow-up providers    Schuyler Amor, MD.  Schedule an appointment as soon as possible for a visit today.   Specialty:  Orthopedic Surgery Why:  Dr. Bertis Ruddy orthopedic office will contact you to schedule the appoinmnet. Contact information: [address] [place] [address] [phone]        [place]. Go on 12/11/2015.   Why:  Go to appt at 9:30 AM Contact information: [place], [place]. Go on 11/11/2015.   Why:  Go to appointment at 1:30 PM Contact information: [place]           Contact information for after-discharge care    [facility] .   Specialty:  [place] information: [address] [place], [place] 11/05/2015, 4:45 PM PGY-1, [place]

## 2015-11-04 NOTE — Clinical Social Work Note (Signed)
Clinical Social Work Assessment  Patient Details  Name: Patrick Holt MRN: [id number] Date of Birth: 10-May-1950  Date of referral:  11/03/15               Reason for consult:  Facility Placement, Discharge Planning                Permission sought to share information with:  Facility Art therapist granted to share information::  Yes, Verbal Permission Granted  Name::        Agency::  SNF's  Relationship::     Contact Information:     Housing/Transportation Living arrangements for the past 2 months:  Apartment Source of Information:  Patient, Medical Team Patient Interpreter Needed:  None Criminal Activity/Legal Involvement Pertinent to Current Situation/Hospitalization:  No - Comment as needed Significant Relationships:  Friend Lives with:  Self Do you feel safe going back to the place where you live?  Yes Need for family participation in patient care:  Yes (Comment)  Care giving concerns:  PT recommending SNF once medically stable for discharge.   Social Worker assessment / plan:  CSW met with patient. No supports at bedside. CSW introduced role and explained that discharge planning would be discussed. Patient reluctant to go to SNF because he is afraid they will take all of his money. CSW explained that his insurance will cover at 100% for the first 20 days and he does not have to stay longer than that. Past the 20 days, he would have to pay 20%. Patient expressed understanding. Patient wants to go to an ALF. CSW will provide ALF list and patient can let CSW know if he finds one he likes. CSW will call them for him. Patient reports that he did not think he had Medicaid but says he recently got a $65 bill. Patient has no family and the friend listed in his emergency contacts is actually his ex-mother-in-law that still helps him out. Patient agreed to have CSW fax out information to SNF's. He has been to [facility] in the past and did not like it. CSW will not fax  to them. Patient concerned about when he will get a cast for his arm. CSW notified RN. No further concerns. CSW encouraged patient to contact CSW as needed. CSW will continue to follow patient for support and facilitate discharge to SNF, if agreeable, once medically stable.  Employment status:  Disabled (Comment on whether or not currently receiving Disability) Insurance information:  Managed Medicare PT Recommendations:  [place] / Referral to community resources:  [place], Other (Comment Required) (Assisted Living Facilities)  Patient/Family's Response to care:  Patient somewhat agreeable to SNF placement. Patient's ex-mother-in-law supportive and involved in patient's care. Patient appreciated social work intervention.  Patient/Family's Understanding of and Emotional Response to Diagnosis, Current Treatment, and Prognosis:  Patient understands need for rehab prior to returning home. Patient wants to go into an ALF. Patient appear happy with hospital care but wants to know when he is getting a cast for his arm. RN notified.  Emotional Assessment Appearance:  Appears stated age Attitude/Demeanor/Rapport:  Other (Pleasant) Affect (typically observed):  Apprehensive, Calm, Pleasant, Appropriate Orientation:  Oriented to Self, Oriented to Place, Oriented to  Time, Oriented to Situation Alcohol / Substance use:  Tobacco Use Psych involvement (Current and /or in the community):  Outpatient Provider  Discharge Needs  Concerns to be addressed:  Care Coordination, Mental Health Concerns Readmission within the last 30 days:  No Current discharge risk:  Lives alone, Dependent with Mobility, Psychiatric Illness Barriers to Discharge:  [place] ([place])   Candie Chroman, LCSW 11/04/2015, 12:29 PM

## 2015-11-04 NOTE — Clinical Social Work Note (Signed)
[place] Must requesting additional information for manual review pasarr number, including a 30-day note. MD notified and will sign when able.  Dayton Scrape, [place]

## 2015-11-04 NOTE — Progress Notes (Addendum)
Transitions of Care Pharmacy Note  Plan:  -Educated on discharge medications, especially focusing on medications with fall risks -Addressed concerns regarding alprazolam, oxybutynin, and amlodipine -Recommend decreasing dose of amlodipine to 5mg  daily. Recommend decreasing oxybutynin frequency from four times a day to either two or three times a day. Counseled patient to try and reduce usage of alprazolam, but he was very reluctant.  -Follow-up with SNF in 48 hours to assess accurate transitions of care  Assessment: Understanding of regimen: poor Understanding of indications: poor Potential of compliance: poor Barriers to Obtaining Medications: Yes  Patrick Holt is an 65 y.o. male who presents with a chief complaint of a fall leading to rib fractures and right distal radial fracture. In anticipation of discharge, pharmacy has reviewed this patient's prior to admission medication history, as well as current inpatient medications listed per the [facility].  Current medication indications, dosing, frequency, and notable side effects reviewed with patient. patient verbalized understanding of current inpatient medication regimen and is aware that the After Visit Summary when presented, will represent the most accurate medication list at discharge.   I had a lengthy conversation with Patrick Holt. He does not appear to have great understanding of his medications and has a history of being frustrated with his primary care. I am concerned that his amlodipine may be contributing to fall risk given the soft SBPs 100s-110s during this admission. Previous records of SBPs were in the 130s-160s in 2016. Patient endorses values as high as 190 before starting BP medication. The first record of amlodipine in our system is from June 2017. I would recommend reducing the dose of amlodipine to 5mg  and following up BPs outpatient.  I also spoke to him about his alprazolam, but he is reluctant to reduce the dose or  frequency as he endorses that he can be very unpleasant without that medication.  I also discussed his oxybutynin, which he says he absolutely needs or else he can pee himself multiple times a day without warning. However, he has not been on this medication during the admission and says he has not urinated himself. It is unclear if he is aware that he is not on the medication inpatient, but I am concerned that sending him out without the medication entirely may cause urinary incontinence episodes and lead him to seek out the medication regardless from other healthcare providers. Unfortunately, cost issues would prohibit Myrbetriq (mirabegron) from being an option. He also has transportation issues and no family to help him.   Patient instructed to contact inpatient pharmacy team with further questions or concerns if needed. These recommendations were discussed with the family medicine resident on duty.  Time spent preparing for discharge counseling: 15 Time spent counseling patient: 33   Thank you for allowing pharmacy to be a part of this patient's care.  Myer Peer Grayland Ormond), PharmD  PGY1 Pharmacy Resident Pager: [phone] 11/04/2015 5:36 PM

## 2015-11-04 NOTE — Progress Notes (Signed)
PT Cancellation Note  Patient Details Name: Patrick Holt MRN: [id number] DOB: September 08, 1950   Cancelled Treatment:    Reason Eval/Treat Not Completed: Patient at procedure or test/unavailable   Attempted to see patient twice today and each time with other disciplines (OT, Case Management) Will reattempt 9/20 (see 9/19 PT eval)   Gionni Vaca 11/04/2015, 4:15 PM Pager [phone]

## 2015-11-05 DIAGNOSIS — I2510 Atherosclerotic heart disease of native coronary artery without angina pectoris: Secondary | ICD-10-CM | POA: Diagnosis not present

## 2015-11-05 DIAGNOSIS — G92 Toxic encephalopathy: Secondary | ICD-10-CM | POA: Diagnosis not present

## 2015-11-05 DIAGNOSIS — N401 Enlarged prostate with lower urinary tract symptoms: Secondary | ICD-10-CM | POA: Diagnosis not present

## 2015-11-05 DIAGNOSIS — S52351A Displaced comminuted fracture of shaft of radius, right arm, initial encounter for closed fracture: Secondary | ICD-10-CM | POA: Diagnosis not present

## 2015-11-05 DIAGNOSIS — S52501A Unspecified fracture of the lower end of right radius, initial encounter for closed fracture: Secondary | ICD-10-CM | POA: Diagnosis not present

## 2015-11-05 DIAGNOSIS — Z8249 Family history of ischemic heart disease and other diseases of the circulatory system: Secondary | ICD-10-CM | POA: Diagnosis not present

## 2015-11-05 DIAGNOSIS — G40909 Epilepsy, unspecified, not intractable, without status epilepticus: Secondary | ICD-10-CM | POA: Diagnosis present

## 2015-11-05 DIAGNOSIS — R262 Difficulty in walking, not elsewhere classified: Secondary | ICD-10-CM | POA: Diagnosis not present

## 2015-11-05 DIAGNOSIS — R296 Repeated falls: Secondary | ICD-10-CM | POA: Diagnosis not present

## 2015-11-05 DIAGNOSIS — W19XXXA Unspecified fall, initial encounter: Secondary | ICD-10-CM | POA: Diagnosis present

## 2015-11-05 DIAGNOSIS — F259 Schizoaffective disorder, unspecified: Secondary | ICD-10-CM | POA: Diagnosis present

## 2015-11-05 DIAGNOSIS — R17 Unspecified jaundice: Secondary | ICD-10-CM | POA: Diagnosis not present

## 2015-11-05 DIAGNOSIS — I4891 Unspecified atrial fibrillation: Secondary | ICD-10-CM | POA: Diagnosis not present

## 2015-11-05 DIAGNOSIS — E7220 Disorder of urea cycle metabolism, unspecified: Secondary | ICD-10-CM | POA: Diagnosis not present

## 2015-11-05 DIAGNOSIS — I480 Paroxysmal atrial fibrillation: Secondary | ICD-10-CM | POA: Diagnosis not present

## 2015-11-05 DIAGNOSIS — E11649 Type 2 diabetes mellitus with hypoglycemia without coma: Secondary | ICD-10-CM | POA: Diagnosis not present

## 2015-11-05 DIAGNOSIS — I10 Essential (primary) hypertension: Secondary | ICD-10-CM

## 2015-11-05 DIAGNOSIS — K219 Gastro-esophageal reflux disease without esophagitis: Secondary | ICD-10-CM | POA: Diagnosis not present

## 2015-11-05 DIAGNOSIS — E870 Hyperosmolality and hypernatremia: Secondary | ICD-10-CM | POA: Diagnosis not present

## 2015-11-05 DIAGNOSIS — R402411 Glasgow coma scale score 13-15, in the field [EMT or ambulance]: Secondary | ICD-10-CM | POA: Diagnosis not present

## 2015-11-05 DIAGNOSIS — Z4789 Encounter for other orthopedic aftercare: Secondary | ICD-10-CM | POA: Diagnosis not present

## 2015-11-05 DIAGNOSIS — S2241XA Multiple fractures of ribs, right side, initial encounter for closed fracture: Secondary | ICD-10-CM | POA: Diagnosis not present

## 2015-11-05 DIAGNOSIS — M797 Fibromyalgia: Secondary | ICD-10-CM | POA: Diagnosis not present

## 2015-11-05 DIAGNOSIS — R4182 Altered mental status, unspecified: Secondary | ICD-10-CM | POA: Diagnosis present

## 2015-11-05 DIAGNOSIS — N179 Acute kidney failure, unspecified: Secondary | ICD-10-CM | POA: Diagnosis not present

## 2015-11-05 DIAGNOSIS — I259 Chronic ischemic heart disease, unspecified: Secondary | ICD-10-CM | POA: Diagnosis not present

## 2015-11-05 DIAGNOSIS — N3281 Overactive bladder: Secondary | ICD-10-CM | POA: Diagnosis not present

## 2015-11-05 DIAGNOSIS — E871 Hypo-osmolality and hyponatremia: Secondary | ICD-10-CM | POA: Diagnosis not present

## 2015-11-05 DIAGNOSIS — E785 Hyperlipidemia, unspecified: Secondary | ICD-10-CM | POA: Diagnosis not present

## 2015-11-05 DIAGNOSIS — I7300 Raynaud's syndrome without gangrene: Secondary | ICD-10-CM | POA: Diagnosis not present

## 2015-11-05 DIAGNOSIS — M069 Rheumatoid arthritis, unspecified: Secondary | ICD-10-CM | POA: Diagnosis not present

## 2015-11-05 DIAGNOSIS — R0902 Hypoxemia: Secondary | ICD-10-CM | POA: Diagnosis not present

## 2015-11-05 DIAGNOSIS — Z79899 Other long term (current) drug therapy: Secondary | ICD-10-CM | POA: Diagnosis not present

## 2015-11-05 DIAGNOSIS — Z833 Family history of diabetes mellitus: Secondary | ICD-10-CM | POA: Diagnosis not present

## 2015-11-05 DIAGNOSIS — J449 Chronic obstructive pulmonary disease, unspecified: Secondary | ICD-10-CM | POA: Diagnosis not present

## 2015-11-05 DIAGNOSIS — I252 Old myocardial infarction: Secondary | ICD-10-CM | POA: Diagnosis not present

## 2015-11-05 DIAGNOSIS — T424X5A Adverse effect of benzodiazepines, initial encounter: Secondary | ICD-10-CM | POA: Diagnosis not present

## 2015-11-05 DIAGNOSIS — F419 Anxiety disorder, unspecified: Secondary | ICD-10-CM | POA: Diagnosis present

## 2015-11-05 DIAGNOSIS — K529 Noninfective gastroenteritis and colitis, unspecified: Secondary | ICD-10-CM | POA: Diagnosis not present

## 2015-11-05 DIAGNOSIS — I482 Chronic atrial fibrillation: Secondary | ICD-10-CM | POA: Diagnosis not present

## 2015-11-05 DIAGNOSIS — E860 Dehydration: Secondary | ICD-10-CM | POA: Diagnosis not present

## 2015-11-05 DIAGNOSIS — Z9181 History of falling: Secondary | ICD-10-CM | POA: Diagnosis not present

## 2015-11-05 DIAGNOSIS — Z87891 Personal history of nicotine dependence: Secondary | ICD-10-CM | POA: Diagnosis not present

## 2015-11-05 DIAGNOSIS — M79601 Pain in right arm: Secondary | ICD-10-CM | POA: Diagnosis not present

## 2015-11-05 DIAGNOSIS — H8110 Benign paroxysmal vertigo, unspecified ear: Secondary | ICD-10-CM | POA: Diagnosis not present

## 2015-11-05 DIAGNOSIS — G20 Parkinson's disease: Secondary | ICD-10-CM | POA: Diagnosis not present

## 2015-11-05 DIAGNOSIS — F319 Bipolar disorder, unspecified: Secondary | ICD-10-CM | POA: Diagnosis present

## 2015-11-05 DIAGNOSIS — S52611A Displaced fracture of right ulna styloid process, initial encounter for closed fracture: Secondary | ICD-10-CM | POA: Diagnosis not present

## 2015-11-05 DIAGNOSIS — R451 Restlessness and agitation: Secondary | ICD-10-CM | POA: Diagnosis not present

## 2015-11-05 DIAGNOSIS — Z811 Family history of alcohol abuse and dependence: Secondary | ICD-10-CM | POA: Diagnosis not present

## 2015-11-05 DIAGNOSIS — E538 Deficiency of other specified B group vitamins: Secondary | ICD-10-CM | POA: Diagnosis not present

## 2015-11-05 DIAGNOSIS — S52571A Other intraarticular fracture of lower end of right radius, initial encounter for closed fracture: Secondary | ICD-10-CM | POA: Diagnosis not present

## 2015-11-05 DIAGNOSIS — E1140 Type 2 diabetes mellitus with diabetic neuropathy, unspecified: Secondary | ICD-10-CM | POA: Diagnosis not present

## 2015-11-05 DIAGNOSIS — S52613D Displaced fracture of unspecified ulna styloid process, subsequent encounter for closed fracture with routine healing: Secondary | ICD-10-CM | POA: Diagnosis not present

## 2015-11-05 MED ORDER — PREDNISONE 50 MG PO TABS: 50.0000 mg | ORAL_TABLET | Freq: Every day | ORAL | 0 refills | Status: AC

## 2015-11-05 MED ORDER — AMLODIPINE BESYLATE 5 MG PO TABS: 5.0000 mg | ORAL_TABLET | Freq: Every day | ORAL | 0 refills | Status: DC

## 2015-11-05 MED ORDER — ALPRAZOLAM 0.5 MG PO TABS: 0.5000 mg | ORAL_TABLET | Freq: Three times a day (TID) | ORAL | 0 refills | Status: DC | PRN

## 2015-11-05 MED ORDER — AMLODIPINE BESYLATE 5 MG PO TABS
5.0000 mg | ORAL_TABLET | Freq: Every day | ORAL | Status: DC
  Administered 2015-11-05: 5 mg via ORAL
  Filled 2015-11-05: qty 1

## 2015-11-05 NOTE — Progress Notes (Signed)
Family Medicine Teaching Service Daily Progress Note Intern Pager: [phone]  Patient name: Patrick Holt Medical record number: [id number] Date of birth: 08/22/1950 Age: 65 y.o. Gender: male  Primary Care Provider: [name], MD Consultants: None Code Status: FULL  Pt Overview and Major Events to Date:  9/17: admit for hypoxia in the context of right rib 6 & 7 fx  Assessment and Plan: Patrick Holt is a 65 y.o. male presenting with hypoxia and cough, found to have rib fracture s/p fall . PMH is significant for COPD, AFib (on Amiodarone; not candidate for anticoagulation due to hx of subdural hematoma), HFpEF , HTN, CAD, Depression/Anxiety/Schizoprenia, GERD, hx of seizure do, diverticulosis.  1. Hypoxia: In setting of cough and anterior right 6th and 7th rib fractures. Required 3L O2 in ED to maintain appropatie O2 saturations. Patient has history of COPD; is not on home O2. CXR with patchy bilateral lower lobe opacity (appears fairly similar to CXR from 9/13) which was read as nonspecific, favoring atelectasis, but unable to exclude a component of aspiration or PNA. Suspect atelectasis secondary to lack of deep inspiration in setting of rib fractures as likely cause of hypoxia. May have mild component of COPD exacerbation as well given new onset cough.  Patient has remained afebrile and without leukocytosis during admission, so less suspicious for PNA. Well's score of 0 so low concern for PE as cause of hypoxia.  - Azithro 500 mg PO (9/18-9/20), s/p CTX and Azithromycin (9/17) - Supplemental O2 prn, wean as tolerated  - Duonebs q6h/albuterol q4h prn  - Prednisone 50 mg daily (9/18-9/22) - Incentive spirometer  - Tylenol and ibuprofen prn pain, avoiding narcotics due to substance abuse history and falls, consider adding Voltaren gel if pain control becomes an issue   2. Right Distal Radial Fracture: S/p mechanical fall on 9/13. Splint placed for wrist fracture at ED visit. Patient  to follow up with hand surgery but reports difficulty with establishing appointment.  - Help to facilitate follow up outpatient for radial fracture upon discharge - Was seen by orthopedics, no recommendation for pinning of fx, f/u outpatient  3. Recurrent Falls: Patient reports history of recurrent falls. Most recently fell on 9/13. Patient reports hitting head during that fall. CT head without acute findings on 9/13.  - Fall precautions  - UDS pos for benzos, on xanax during hospitalization - Low threshold to re-image head if patient has change in mental status or neuro exam  - Avoid narcotics given recurrent falls  - Continue meclizine prn  - PT/OT recommend SNF for supervision mobility  4. COPD: has history of COPD; home med Combivent. PFT 2014: FEV1 96%, FEV1/FVC 66%. Patient hospitalized in November 2016 for COPD exacerbation.  - Plan as noted above - Holding Combivent due to [employer]  - Outpatient consider additional controller medication such as LABA +/- ICS   5. AFib: In sinus rhythm on ED EKG and rate controlled.Not a candidate for anticoagulation due to "tiny bilateral frontal subarachnoid or subdural hemorrhages without significant trauma" seen on MRI 02/04/2012. Patient does not appear to be on BB. Upon chart review, patient recently requested metoprolol refill but was denied by PCP due to not being on medication for several years; PCP recommended f/u with cardiology regarding BB. EKG NSR w/o ST changes. - Continue home Amiodarone 100 mg qd  6. HTN: Blood pressures have been stable. 101-105/59-81. - Decreased home amlodipine from 10>5 mg daily per pharmacy recs s/p fall history  7. CAD:  ASA and Lipitor   8. Depression/Anxiety/Schizoprenia: Continue Depakote  9. Hx of Seizure: Continue Keppra 500mg  BID  10. GERD: Continue Protonix  11. Parkinsonism: Continue requip  - Will consider outpt neuro f/u upon d/c  12. Chronic Diarrhea: Continue Imodium PRN  13.  Urge Incontinence : - Discontinue oxybutynin, will mention decreasing to 2-3 times per day in discharge instruction per pharm rec if consider restarting out pt - Consider alternate therapy given this medications anti-cholinergic properties   FEN/GI: Soft Diet (patient without dentures) Prophylaxis: Lovenox   Disposition: agreeable to SNF per PT recs  Subjective:  Afebrile and stable on RA. Eating breakfast. Patient was agreeable to SNF as long as he does not have to pay for past day 20 (see CM note). Says right chest pain has improved. No new complaints.   Objective: Temp:  [97.8 F (36.6 C)-98.9 F (37.2 C)] 97.9 F (36.6 C) (09/20 0459) Pulse Rate:  [72-97] 97 (09/20 0459) Resp:  [16-20] 18 (09/20 0459) BP: (94-115)/(56-80) 110/70 (09/20 0459) SpO2:  [91 %-93 %] 91 % (09/20 0459) Weight:  [223 lb 8 oz (101.4 kg)] 223 lb 8 oz (101.4 kg) (09/20 0459) Physical Exam: General: obese, well nourished, well developed, in no acute distress with non-toxic appearance HEENT: normocephalic, atraumatic, moist mucous membranes Neck: supple, non-tender without lymphadenopathy CV: regular rate and rhythm without murmurs, rubs, or gallops, mild R chest tenderness near rib 6-7 Lungs: upper airways sounds bilaterally with normal work of breathing, no wheeze appreciated, on RA Abdomen: soft, non-tender, no masses or organomegaly palpable, normoactive bowel sounds Skin: warm, dry, no rashes or lesions, cap refill < 2 seconds Extremities: warm and well perfused, normal tone  Laboratory:  Recent Labs Lab 10/29/15 1337 11/02/15 1654 11/04/15 0243  WBC 9.2 9.0 10.5  HGB 16.2 15.6 12.8*  HCT 49.9 47.5 40.8  PLT 273 269 284    Recent Labs Lab 11/02/15 1654 11/03/15 0305 11/04/15 0243  NA 143 140 142  K 3.4* 3.3* 4.2  CL 108 105 110  CO2 28 26 26   BUN 18 14 14   CREATININE 1.04 1.11 1.08  CALCIUM 8.7* 8.4* 8.5*  PROT 7.4 6.3*  --   BILITOT 1.2 0.7  --   ALKPHOS 75 70  --   ALT 20 19   --   AST 32 30  --   GLUCOSE 87 101* 137*   Troponin:  Neg PT/INR:  13.4/1.02 UDS:  benzos (on xanax)  Imaging/Diagnostic Tests: Dg Chest 2 View: Result Date: 11/02/2015 CLINICAL DATA:  Cough. EXAM: CHEST  2 VIEW COMPARISON:  10/29/2015 chest radiograph. FINDINGS: Stable cardiomediastinal silhouette with mild cardiomegaly and aortic atherosclerosis. No pneumothorax. No pleural effusion. No pulmonary edema. Patchy opacity in both lower lobes, not appreciably changed. Stable eventration anterior right hemidiaphragm. IMPRESSION: 1. Stable mild cardiomegaly without overt pulmonary edema . 2. Stable mild patchy bilateral lower lobe opacity, nonspecific, favor atelectasis, cannot exclude a component of aspiration or pneumonia. Electronically Signed   By: Ilona Sorrel M.D.   On: 11/02/2015 16:49   Dg Ribs Unilateral W/chest Right: Result Date: 11/02/2015 CLINICAL DATA:  Right rib pain after fall. EXAM: RIGHT RIBS AND CHEST - 3+ VIEW COMPARISON:  Chest radiograph from earlier today. FINDINGS: There are acute mildly displaced fractures of the anterior right sixth and seventh ribs. No additional fracture. No suspicious focal osseous lesion. Stable eventration of the anterior right hemidiaphragm with patchy bibasilar lung opacities. No overt pneumothorax on these three views. IMPRESSION: 1. Acute mildly  displaced anterior right sixth and seventh rib fractures. No overt pneumothorax on these rib views . 2. Stable eventration of the anterior right hemidiaphragm with patchy nonspecific bibasilar lung opacities, favor atelectasis, cannot exclude a component of aspiration or pneumonia. Electronically Signed   By: Ilona Sorrel M.D.   On: 11/02/2015 16:54   CT WRIST RIGHT WO CONTRAST: 11/04/15 IMPRESSION: 1. Comminuted fracture of the distal radial metaphysis extending to the epiphysis. 8 mm of distraction of the distal radial articular surface. 10 mm of dorsal displacement of the distal dorsal major fracture  fragment. 2. Minimally displaced fracture of the ulnar styloid process. 3. Widening of the scapholunate joint most concerning for scapholunate ligament tear.    Craig Bing, DO 11/05/2015, 9:39 AM PGY-1, [place] Intern pager: [phone], text pages welcome

## 2015-11-05 NOTE — Clinical Social Work Note (Signed)
CSW facilitated patient discharge including contacting patient family and facility to confirm patient discharge plans. Clinical information faxed to facility and family agreeable with plan. CSW arranged ambulance transport via [facility] to [facility]. RN to call report prior to discharge ([phone]).  CSW will sign off for now as social work intervention is no longer needed. Please consult us again if new needs arise.  Terrill Alperin, CSW [phone]   

## 2015-11-05 NOTE — Progress Notes (Signed)
Report called and given to Carolann Littler at [place], [place] 5:39 PM 11-05-2015

## 2015-11-05 NOTE — Clinical Social Work Placement (Signed)
   CLINICAL SOCIAL WORK PLACEMENT  NOTE  Date:  11/05/2015  Patient Details  Name: Patrick Holt MRN: [id number] Date of Birth: 1950-11-20  Clinical Social Work is seeking post-discharge placement for this patient at the [place] level of care (*CSW will initial, date and re-position this form in  chart as items are completed):  Yes   Patient/family provided with [facility] Work Department's list of facilities offering this level of care within the geographic area requested by the patient (or if unable, by the patient's family).  Yes   Patient/family informed of their freedom to choose among providers that offer the needed level of care, that participate in Medicare, Medicaid or managed care program needed by the patient, have an available bed and are willing to accept the patient.  Yes   Patient/family informed of [facility] [place]'s ownership interest in [facility] and [facility], as well as of the fact that they are under no obligation to receive care at these facilities.  PASRR submitted to EDS on 11/04/15     PASRR number received on 11/05/15     Existing PASRR number confirmed on       FL2 transmitted to all facilities in geographic area requested by pt/family on 11/04/15     FL2 transmitted to all facilities within larger geographic area on       Patient informed that his/her managed care company has contracts with or will negotiate with certain facilities, including the following:        Yes   Patient/family informed of bed offers received.  Patient [facility] bed at [facility]     Physician recommends and patient chooses bed at      Patient to be transferred to [facility] on 11/05/15.  Patient to be transferred to facility by [facility]     Patient family notified on 11/05/15 of transfer.  Name of family member notified:        PHYSICIAN       Additional Comment:     _______________________________________________ Candie Chroman, LCSW 11/05/2015, 4:36 PM

## 2015-11-05 NOTE — Clinical Social Work Note (Addendum)
H&P, FL2, and 30-day note uploaded to [place] Must for PAS[facility] manual review.  Dayton Scrape, [place]  10:21 am [place]: [id number] Arvin Collard, CSW [phone]  1:37 pm CSW provided bed offers. Patient has one from [facility]. Patient stated that he does not care where he goes when he discharge. CSW told patient that he should care because it is his decision. Patient is from [employer]. Per patient, staff at [employer] are working on rearranging his furniture to prevent falls. Patient saw MD this morning but does not know when he will discharge. Patient concerned of when he will get a cast for his arm.  Dayton Scrape, CSW [phone]  2:58 pm CSW received voicemail from MD. Returned call and left voicemail.  Dayton Scrape, [place]

## 2015-11-05 NOTE — Clinical Social Work Note (Signed)
Yesterday late afternoon, CSW met with patient and provided list of ALF's in the [facility] area. Patient wanted CSW to choose one and send him there. CSW explained that CSW did not have enough knowledge of the ALF's in the area to do that and it is his choice to decide where he goes. Patient became frustrated by this but easily redirected. MD has discussed concerns over patient's government cell phone. CSW asked about it and he said that he gets his cell phone service through [place]. He had an older phone that's battery was dying quickly. He called his cell phone provider and requested a new battery but they sent him a smart phone that he has difficulty understanding. He does have a new battery for his old phone but his ex-mother-in-law has it. CSW cannot offer any services for his cell phone.  Dayton Scrape, [place]

## 2015-11-05 NOTE — Discharge Instructions (Signed)
Patrick Holt, you were admitted for what appeared to be a COPD exacerbation in the setting of your right rib fractures. We did give you antibiotics and steroids to cover for possible pneumonia though we thought it was more likely you decreased breathing due to right-sided chest pain. You made improvement with your breathing. You will be sent to a short term rehab to help build your strength.   Please follow up with our clinic for hospital follow up on 9/26 at 1:30 PM  I have called scheduled you an appointment wit neurology on 10/26 at 9:30 AM  Dr. Jeral Fruit office (hand surgeon) will reach out to you to schedule an appointment for your wrist fracture  Please discontinue Xanax use as this increases risk of falls and can worsen your health

## 2015-11-05 NOTE — Progress Notes (Signed)
Physical Therapy Treatment Patient Details Name: Patrick Holt MRN: [id number] DOB: 08/26/1950 Today's Date: 11/05/2015    History of Present Illness Patrick Holt is a 65 y.o. male presenting with hypoxia and cough, found to have rib fracture s/p fall.  Pt also with R wrist fx from fall on 9/13 in which he was supposed to schedule ortho appointment, but per pt has not been able to get appointment. PMH is significant for COPD, AFib (on Amiodarone; not candidate for anticoagulation due to hx of subdural hematoma), HFpEF , HTN, CAD, Depression/Anxiety/Schizoprenia, GERD, hx of seizure do, diverticulosis..    PT Comments    Pt was able to ambulate today, 12' 2x with seated rest break in between  Though he required mod A for stability and to prevent fall while walking. Despite frequent cues, pt keeps trying to use RUE and asks when he will get a hard cast for it. PT will continue to follow.   Follow Up Recommendations  SNF;Supervision for mobility/OOB     Equipment Recommendations  None recommended by PT    Recommendations for Other Services       Precautions / Restrictions Precautions Precautions: Fall Precaution Comments: 8 falls in 5 days, R wrist fx, R 6th & 7th rib fxs Required Braces or Orthoses: Other Brace/Splint Other Brace/Splint: splint on R UE Restrictions Weight Bearing Restrictions: Yes RUE Weight Bearing: Non weight bearing Other Position/Activity Restrictions: per MD note, pt NWB    Mobility  Bed Mobility Overal bed mobility: Needs Assistance Bed Mobility: Supine to Sit;Sit to Supine     Supine to sit: Supervision Sit to supine: Min assist   General bed mobility comments: supervision with vc's not to use RUE for coming to left side of bed. Min A to scoot up in bed with return to bed and again, vc's for not using RUE  Transfers Overall transfer level: Needs assistance Equipment used: Straight cane Transfers: Sit to/from Stand Sit to Stand: Min  assist         General transfer comment: performed multiple times with improvement each trial. vc's for fwd wt shift and lifting trunk. Cues for protecting RUE as he sat down. Did the best whe he placed right hand on cane to sit because then he could not put it back on the bed  Ambulation/Gait Ambulation/Gait assistance: Min assist Ambulation Distance (Feet): 24 Feet (12' x2) Assistive device: Straight cane Gait Pattern/deviations: Staggering right;Wide base of support Gait velocity: decreased Gait velocity interpretation: <1.8 ft/sec, indicative of risk for recurrent falls General Gait Details: pt very unsteady with gait and not protecting right arm when ambulating, bumped wall and computer though therapist placed hand between his elbow and those surfaces. Instability with turning as well as backing up. Increased reponse time with perturbation as well as slow correction   Stairs            Wheelchair Mobility    Modified Rankin (Stroke Patients Only)       Balance Overall balance assessment: Needs assistance;History of Falls Sitting-balance support: Single extremity supported Sitting balance-Leahy Scale: Fair     Standing balance support: Single extremity supported Standing balance-Leahy Scale: Poor Standing balance comment: requires at least min A to safely maintain standing                    Cognition Arousal/Alertness: Awake/alert Behavior During Therapy: Flat affect Overall Cognitive Status: No family/caregiver present to determine baseline cognitive functioning Area of Impairment: Safety/judgement;Problem solving;Memory  Memory: Decreased recall of precautions;Decreased short-term memory   Safety/Judgement: Decreased awareness of deficits;Decreased awareness of safety   Problem Solving: Slow processing;Decreased initiation;Difficulty sequencing;Requires verbal cues;Requires tactile cues General Comments: pt has been educated multiple times on NWB  RUE but though he can verbalize this, he does not keep it while moving, has to be repeatedly corrected    Exercises Other Exercises Other Exercises: shoulder flexion in sitting 5x Other Exercises: finger opposition    General Comments General comments (skin integrity, edema, etc.): pt concerned because he has checks to go out for bills in October and no way to get to his home to get them when he goes to [place]. Discussed this with case management who reported that he would need to have a friend or family member to go for him but he states that he does not have any reliable support.      Pertinent Vitals/Pain Pain Assessment: Faces Faces Pain Scale: Hurts little more Pain Location: RUE Pain Descriptors / Indicators: Aching Pain Intervention(s): Limited activity within patient's tolerance;Monitored during session    Home Living                      Prior Function            PT Goals (current goals can now be found in the care plan section) Acute Rehab PT Goals Patient Stated Goal: to go home PT Goal Formulation: With patient Time For Goal Achievement: 11/17/15 Potential to Achieve Goals: Fair Progress towards PT goals: Progressing toward goals    Frequency    Min 3X/week      PT Plan Current plan remains appropriate    Co-evaluation             End of Session Equipment Utilized During Treatment: Gait belt Activity Tolerance: Patient tolerated treatment well Patient left: in bed;with call bell/phone within reach     Time: 1524-1550 PT Time Calculation (min) (ACUTE ONLY): 26 min  Charges:  $Gait Training: 8-22 mins $Therapeutic Activity: 8-22 mins                    G Codes:     Leighton Roach, PT  Acute Rehab Services  [place], [place] 11/05/2015, 4:20 PM

## 2015-11-06 IMAGING — CT CT ABD-PELV W/ CM
2 of 5 series · 16 of 46 positions shown, 18 images · IV contrast (APPLIED)
Comparison: CT of the abdomen and pelvis 07/15/2009.

CLINICAL DATA: Left-sided chest pain.  Lower abdominal pain.

EXAM:
CT ABDOMEN AND PELVIS WITH CONTRAST
TECHNIQUE: Multidetector CT imaging of the abdomen and pelvis was performed
using the standard protocol following bolus administration of
intravenous contrast.
CONTRAST:  80mL OMNIPAQUE IOHEXOL 300 MG/ML  SOLN

[Series 2: abd/ pelvis 5.0 i30f 1 · axial · 0.97mm/px · z∈[+897,+1322]mm · 13 of 95 slices shown, 15 images]
[im 5/95  soft-tissue]
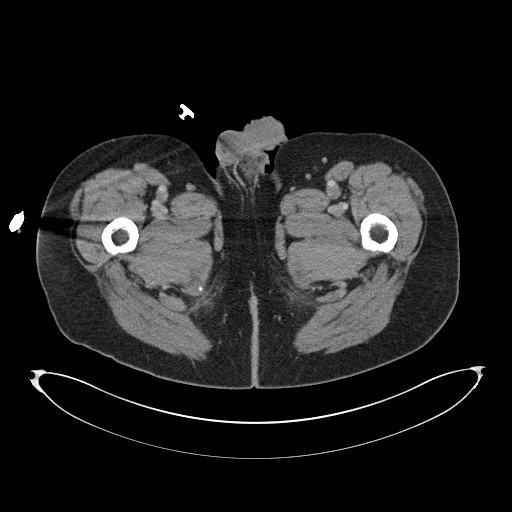
[im 5/95  bone]
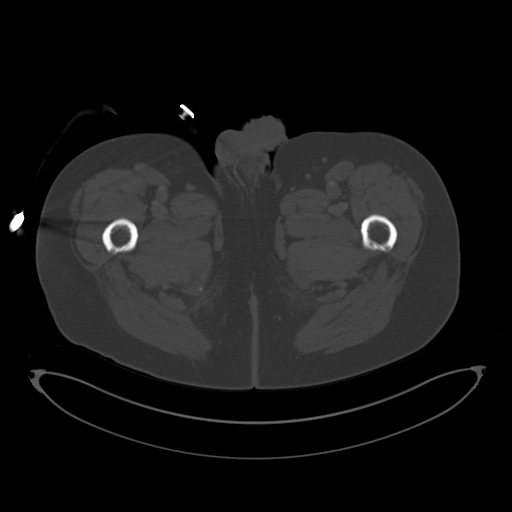
[im 15/95  soft-tissue]
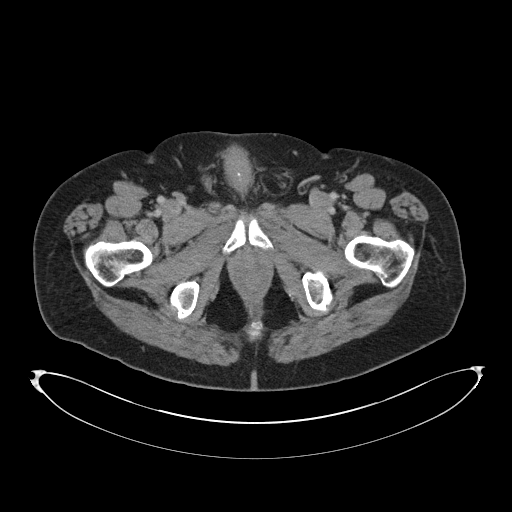
[im 20/95  soft-tissue]
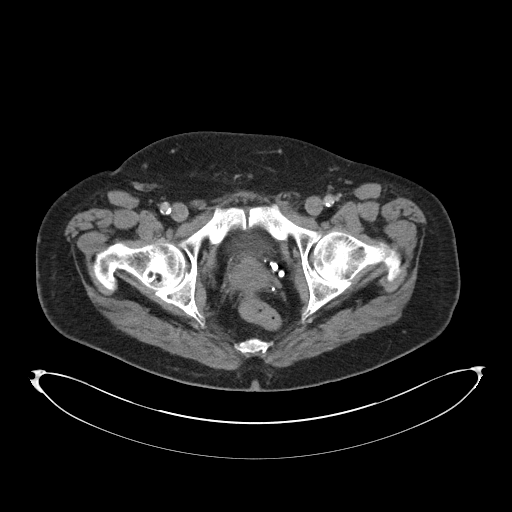
[im 25/95  soft-tissue]
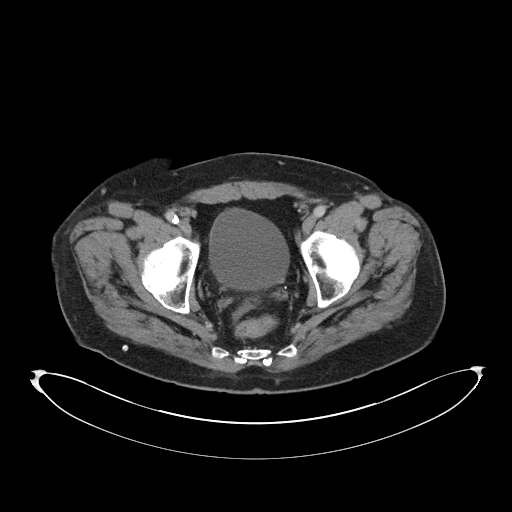
[im 35/95  soft-tissue]
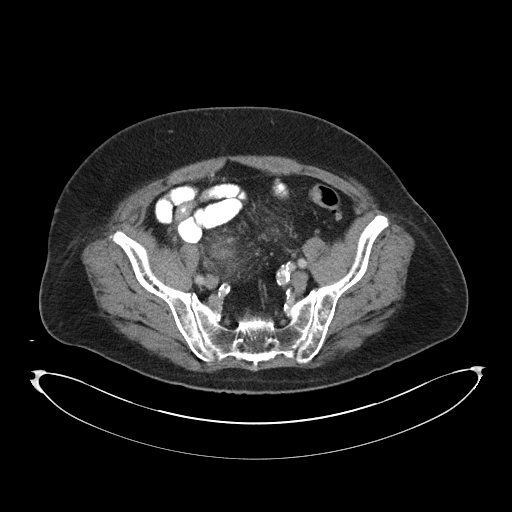
[im 40/95  soft-tissue]
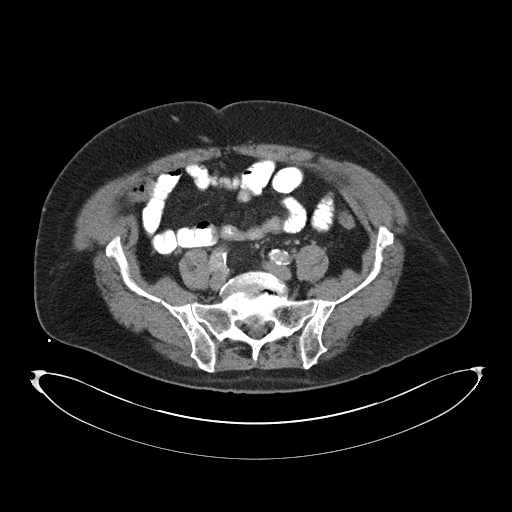
[im 50/95  soft-tissue]
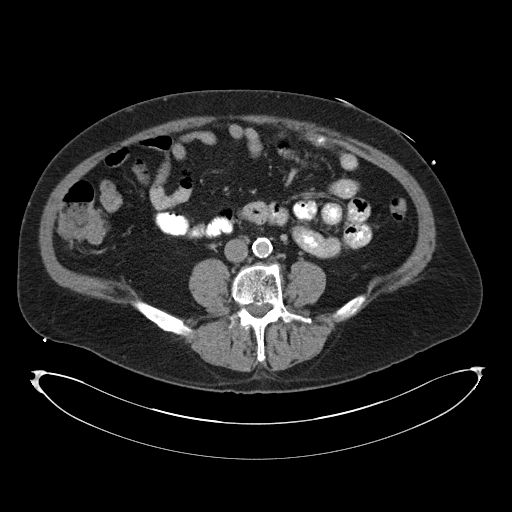
[im 55/95  soft-tissue]
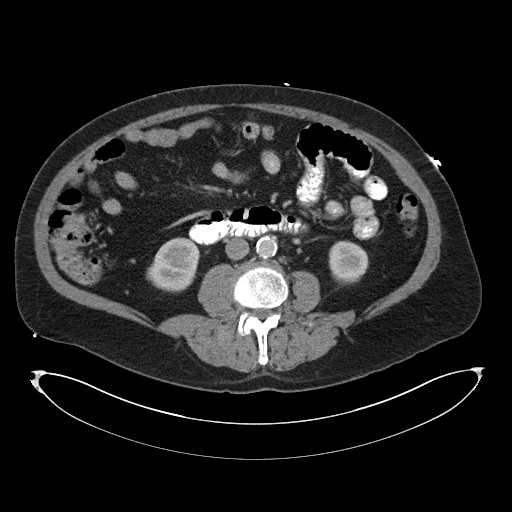
[im 60/95  soft-tissue]
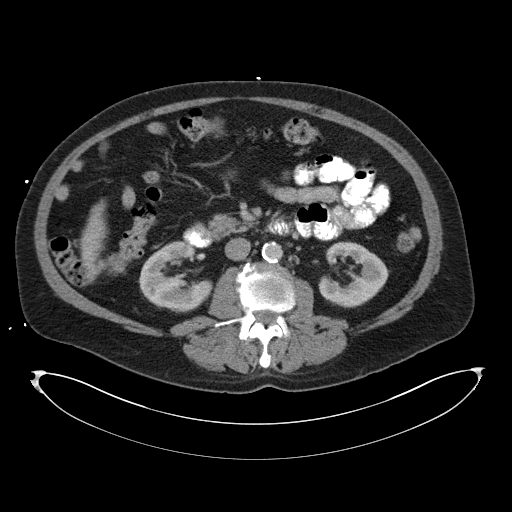
[im 60/95  bone]
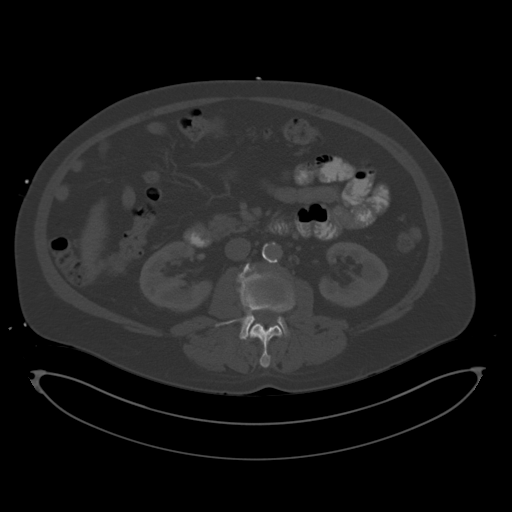
[im 70/95  soft-tissue]
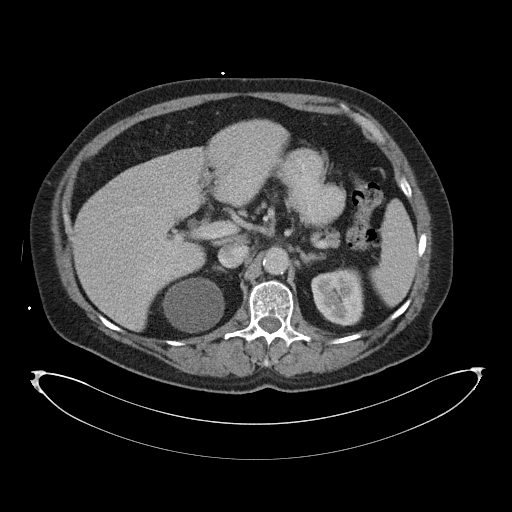
[im 75/95  soft-tissue]
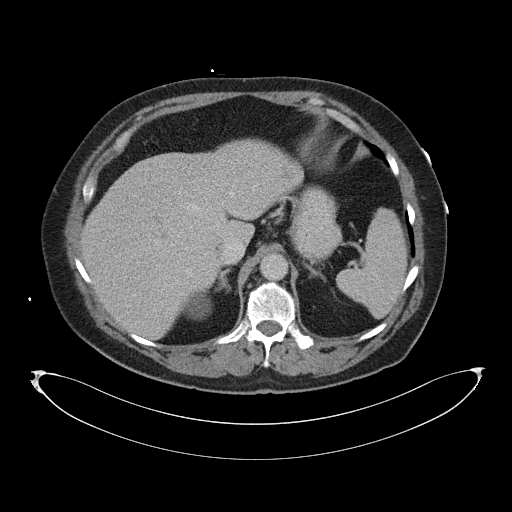
[im 80/95  soft-tissue]
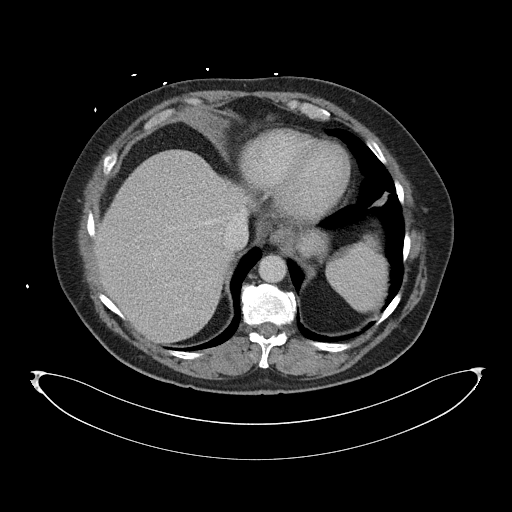
[im 90/95  soft-tissue]
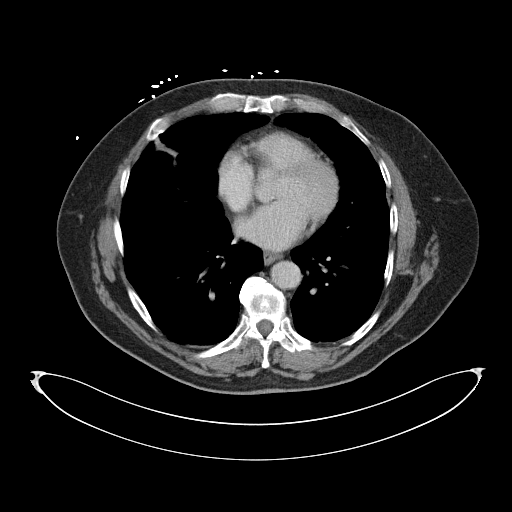

[Series 5: coronals · coronal · 0.86mm/px · 3 of 179 slices shown]
[im 60/179  soft-tissue]
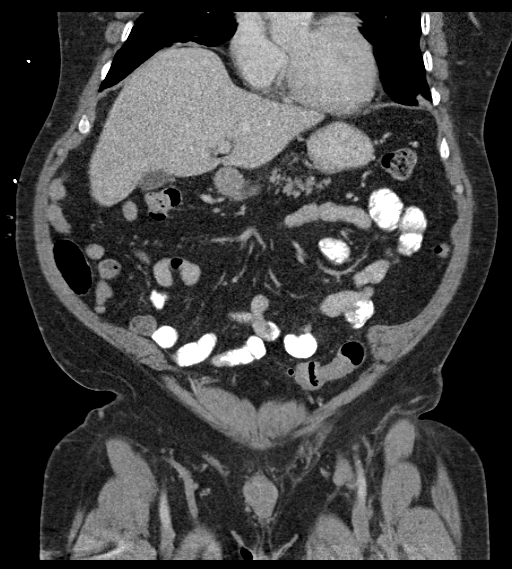
[im 80/179  soft-tissue]
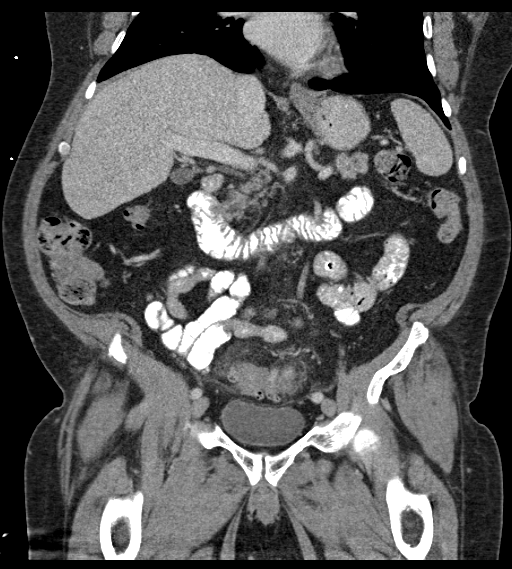
[im 99/179  soft-tissue]
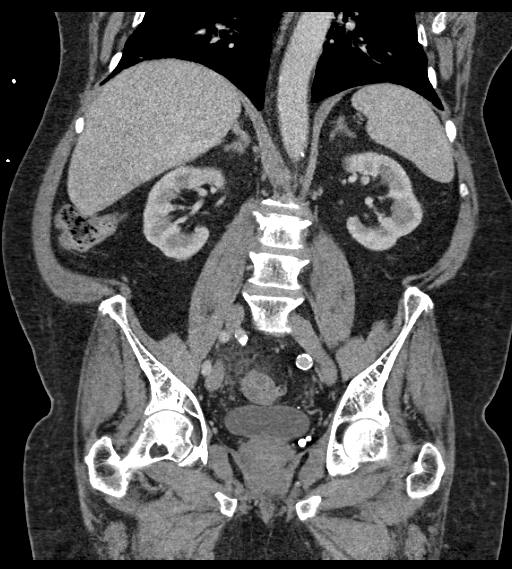

[16 of 46 positions shown; findings below may reference images not displayed]

FINDINGS: Lung Bases: Dependent areas of scarring and/or atelectasis in the
lower lobes of the lungs bilaterally. Atherosclerotic calcifications
in the left anterior descending and right coronary arteries.

Abdomen/Pelvis: The appearance of the liver, gallbladder, pancreas,
spleen and bilateral adrenal glands is unremarkable. Several
low-attenuation lesions in the kidneys bilaterally are noted, the
majority of which are too small to definitively characterize. The
larger of the renal lesions are all compatible with simple cysts,
with the largest of these measuring 5.7 cm in diameter in the upper
pole the right kidney. Atherosclerosis throughout the abdominal and
pelvic vasculature, without evidence of aneurysm or dissection.

Colonic diverticulosis. Adjacent to several colonic diverticulae in
the region of the sigmoid colon there is extensive soft tissue
stranding and hypervascularity, compatible with acute
diverticulitis. No well-defined fluid collection is identified at
this time to suggest presence of diverticular abscess. No
pneumoperitoneum. No significant volume of ascites. No pathologic
distention of small bowel. Normal appendix. Prostate gland and
urinary bladder are unremarkable in appearance.

Musculoskeletal: There are no aggressive appearing lytic or blastic
lesions noted in the visualized portions of the skeleton.
IMPRESSION: 1. Acute diverticulitis of the sigmoid colon. No diverticular
abscess or signs to suggest frank perforation are noted at this
time.
2. Normal appendix.
3. Extensive atherosclerosis, including at least 2 vessel coronary
artery disease. Please note that although the presence of coronary
artery calcium documents the presence of coronary artery disease,
the severity of this disease and any potential stenosis cannot be
assessed on this non-gated CT examination. Assessment for potential
risk factor modification, dietary therapy or pharmacologic therapy
may be warranted, if clinically indicated.
4. Additional incidental findings, as above.
These results were called by telephone at the time of interpretation
on 05/11/2013 at [DATE] to Dr. HAXCUN SAYADUL, who verbally
acknowledged these results.

## 2015-11-06 IMAGING — CR DG CHEST 2V
2 series · 2 of 2 positions shown · non-contrast
Comparison: Chest x-ray 12/30/2012.

CLINICAL DATA: Left-sided chest pain.

EXAM:
CHEST  2 VIEW

[w chest pa]
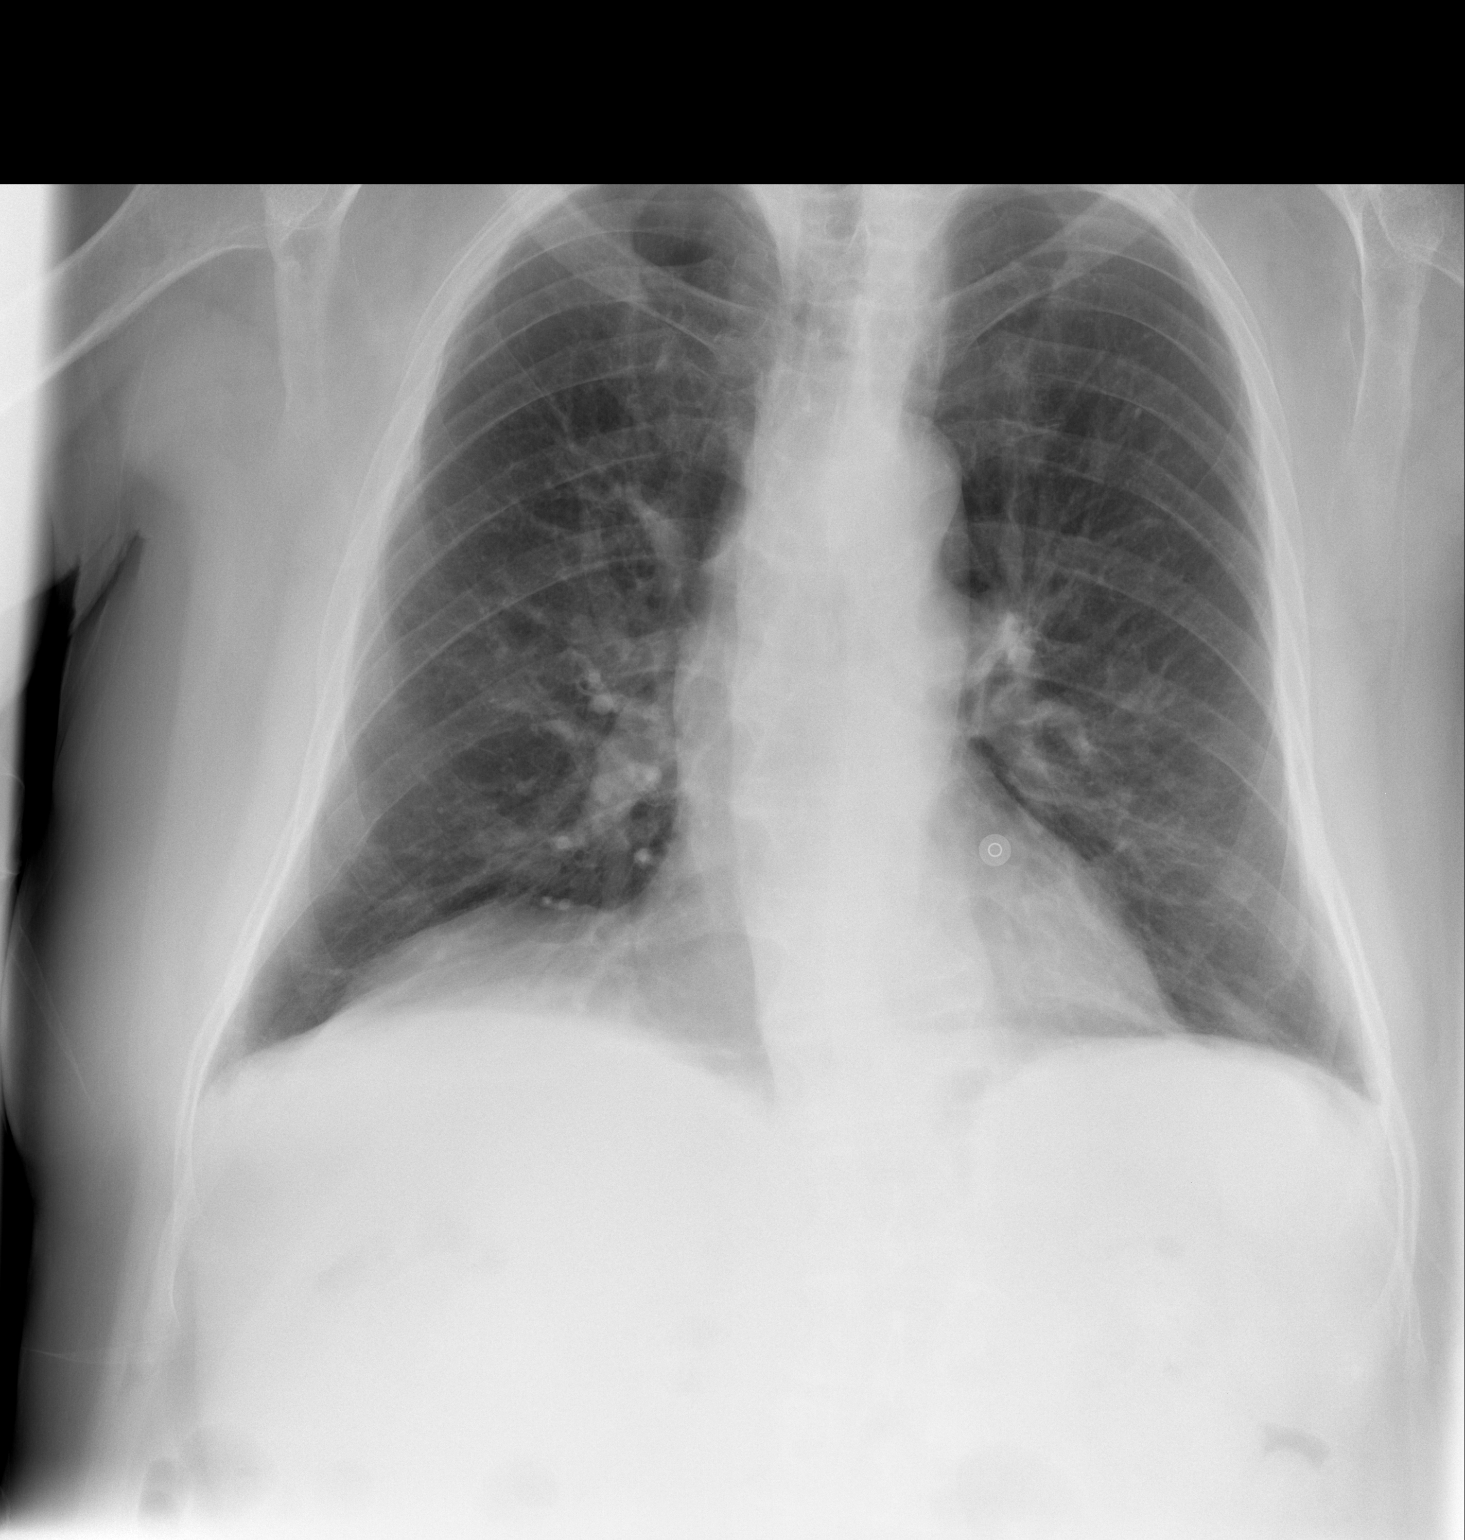

[w chest lat]
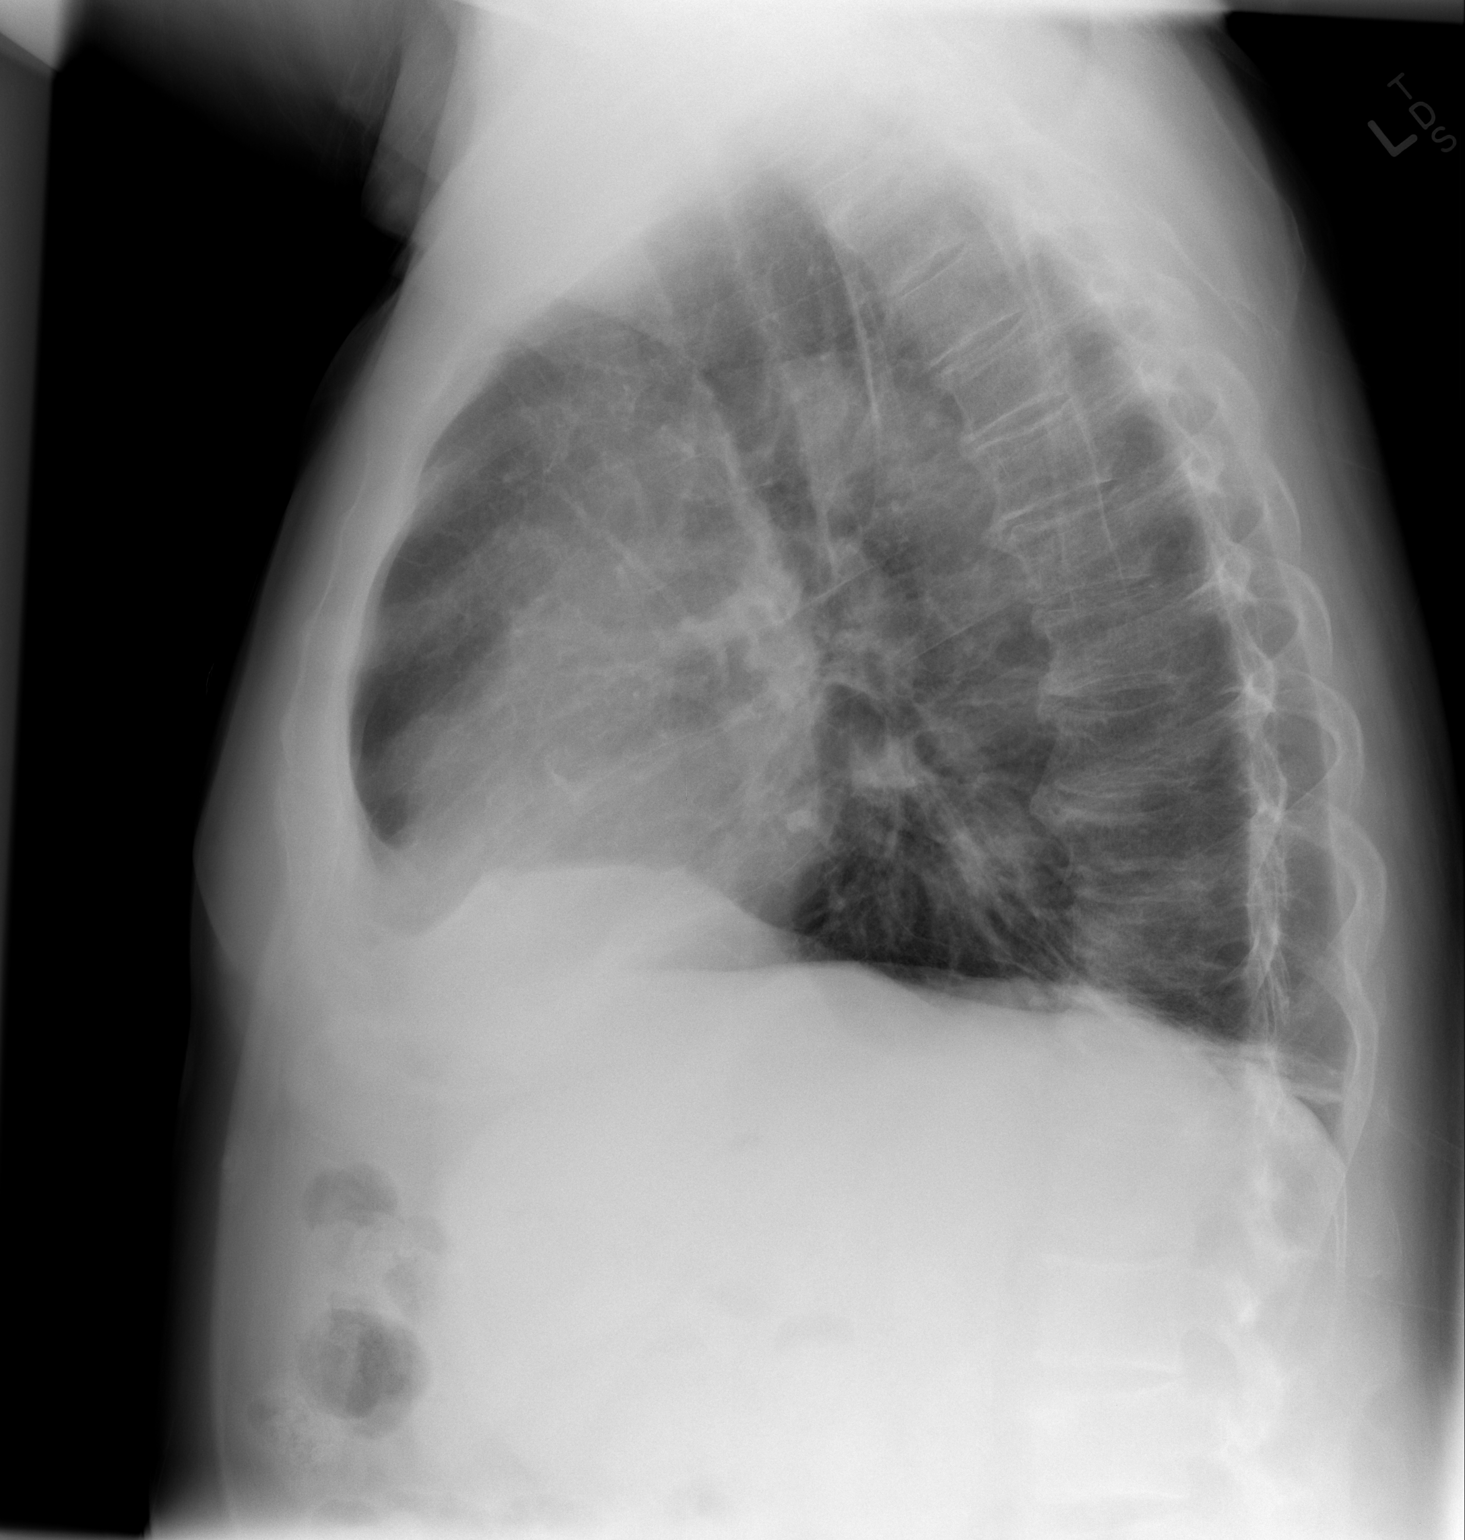

[2 of 2 positions shown; findings below may reference images not displayed]

FINDINGS: Linear opacities in lung bases bilaterally most compatible with
areas of subsegmental atelectasis or scarring. No consolidative
airspace disease. No pleural effusions. No evidence of pulmonary
edema. Heart size is normal. Mediastinal contours are unremarkable.
IMPRESSION: 1. No radiographic evidence of acute cardiopulmonary disease.
2. Minimal bibasilar subsegmental atelectasis or scarring.

## 2015-11-06 NOTE — Progress Notes (Signed)
Late entry due to missed g-code.  29-Nov-2015 1500  OT G-codes **NOT FOR INPATIENT CLASS**  Functional Assessment Tool Used clinical judgement  Functional Limitation Self care  Self Care Current Status [phone]) CL  Self Care Goal Status [id number]) CJ  Nov 29, 2015 Nestor Lewandowsky, OTR/L Pager: [phone]

## 2015-11-07 DIAGNOSIS — R451 Restlessness and agitation: Secondary | ICD-10-CM | POA: Diagnosis not present

## 2015-11-07 DIAGNOSIS — M79601 Pain in right arm: Secondary | ICD-10-CM | POA: Diagnosis not present

## 2015-11-10 DIAGNOSIS — S52571A Other intraarticular fracture of lower end of right radius, initial encounter for closed fracture: Secondary | ICD-10-CM | POA: Diagnosis not present

## 2015-11-10 DIAGNOSIS — N3281 Overactive bladder: Secondary | ICD-10-CM | POA: Diagnosis not present

## 2015-11-10 DIAGNOSIS — Z9181 History of falling: Secondary | ICD-10-CM | POA: Diagnosis not present

## 2015-11-11 ENCOUNTER — Inpatient Hospital Stay: Payer: Self-pay | Admitting: Family Medicine

## 2015-11-11 DIAGNOSIS — N401 Enlarged prostate with lower urinary tract symptoms: Secondary | ICD-10-CM | POA: Diagnosis not present

## 2015-11-11 DIAGNOSIS — M79601 Pain in right arm: Secondary | ICD-10-CM | POA: Diagnosis not present

## 2015-11-11 DIAGNOSIS — N3281 Overactive bladder: Secondary | ICD-10-CM | POA: Diagnosis not present

## 2015-11-13 DIAGNOSIS — R451 Restlessness and agitation: Secondary | ICD-10-CM | POA: Diagnosis not present

## 2015-11-14 ENCOUNTER — Emergency Department (HOSPITAL_COMMUNITY): Payer: Medicare Other

## 2015-11-14 ENCOUNTER — Encounter (HOSPITAL_COMMUNITY): Payer: Self-pay | Admitting: Emergency Medicine

## 2015-11-14 ENCOUNTER — Inpatient Hospital Stay (HOSPITAL_COMMUNITY)
Admission: EM | Admit: 2015-11-14 | Discharge: 2015-11-18 | DRG: 092 | Disposition: A | Discharge status: Skilled Nursing Facility | Payer: Medicare Other | Attending: Internal Medicine | Admitting: Internal Medicine

## 2015-11-14 DIAGNOSIS — J449 Chronic obstructive pulmonary disease, unspecified: Secondary | ICD-10-CM | POA: Diagnosis not present

## 2015-11-14 DIAGNOSIS — F329 Major depressive disorder, single episode, unspecified: Secondary | ICD-10-CM | POA: Diagnosis present

## 2015-11-14 DIAGNOSIS — N179 Acute kidney failure, unspecified: Secondary | ICD-10-CM | POA: Diagnosis not present

## 2015-11-14 DIAGNOSIS — Z8249 Family history of ischemic heart disease and other diseases of the circulatory system: Secondary | ICD-10-CM

## 2015-11-14 DIAGNOSIS — W19XXXA Unspecified fall, initial encounter: Secondary | ICD-10-CM | POA: Diagnosis present

## 2015-11-14 DIAGNOSIS — I7300 Raynaud's syndrome without gangrene: Secondary | ICD-10-CM | POA: Diagnosis present

## 2015-11-14 DIAGNOSIS — R4182 Altered mental status, unspecified: Secondary | ICD-10-CM | POA: Diagnosis not present

## 2015-11-14 DIAGNOSIS — Z79899 Other long term (current) drug therapy: Secondary | ICD-10-CM | POA: Diagnosis not present

## 2015-11-14 DIAGNOSIS — F17200 Nicotine dependence, unspecified, uncomplicated: Secondary | ICD-10-CM

## 2015-11-14 DIAGNOSIS — I259 Chronic ischemic heart disease, unspecified: Secondary | ICD-10-CM | POA: Diagnosis not present

## 2015-11-14 DIAGNOSIS — I4891 Unspecified atrial fibrillation: Secondary | ICD-10-CM | POA: Diagnosis not present

## 2015-11-14 DIAGNOSIS — E870 Hyperosmolality and hypernatremia: Secondary | ICD-10-CM | POA: Diagnosis present

## 2015-11-14 DIAGNOSIS — G20 Parkinson's disease: Secondary | ICD-10-CM | POA: Diagnosis present

## 2015-11-14 DIAGNOSIS — R17 Unspecified jaundice: Secondary | ICD-10-CM | POA: Diagnosis not present

## 2015-11-14 DIAGNOSIS — J410 Simple chronic bronchitis: Secondary | ICD-10-CM

## 2015-11-14 DIAGNOSIS — E7220 Disorder of urea cycle metabolism, unspecified: Secondary | ICD-10-CM | POA: Diagnosis not present

## 2015-11-14 DIAGNOSIS — G92 Toxic encephalopathy: Principal | ICD-10-CM | POA: Diagnosis present

## 2015-11-14 DIAGNOSIS — T424X5A Adverse effect of benzodiazepines, initial encounter: Secondary | ICD-10-CM | POA: Diagnosis not present

## 2015-11-14 DIAGNOSIS — F319 Bipolar disorder, unspecified: Secondary | ICD-10-CM | POA: Diagnosis present

## 2015-11-14 DIAGNOSIS — E860 Dehydration: Secondary | ICD-10-CM | POA: Diagnosis not present

## 2015-11-14 DIAGNOSIS — Z833 Family history of diabetes mellitus: Secondary | ICD-10-CM

## 2015-11-14 DIAGNOSIS — I2510 Atherosclerotic heart disease of native coronary artery without angina pectoris: Secondary | ICD-10-CM | POA: Diagnosis not present

## 2015-11-14 DIAGNOSIS — F259 Schizoaffective disorder, unspecified: Secondary | ICD-10-CM | POA: Diagnosis not present

## 2015-11-14 DIAGNOSIS — S52501A Unspecified fracture of the lower end of right radius, initial encounter for closed fracture: Secondary | ICD-10-CM | POA: Diagnosis not present

## 2015-11-14 DIAGNOSIS — K219 Gastro-esophageal reflux disease without esophagitis: Secondary | ICD-10-CM | POA: Diagnosis present

## 2015-11-14 DIAGNOSIS — E538 Deficiency of other specified B group vitamins: Secondary | ICD-10-CM | POA: Diagnosis not present

## 2015-11-14 DIAGNOSIS — Z9181 History of falling: Secondary | ICD-10-CM | POA: Diagnosis not present

## 2015-11-14 DIAGNOSIS — G9340 Encephalopathy, unspecified: Secondary | ICD-10-CM

## 2015-11-14 DIAGNOSIS — Z720 Tobacco use: Secondary | ICD-10-CM | POA: Diagnosis present

## 2015-11-14 DIAGNOSIS — E785 Hyperlipidemia, unspecified: Secondary | ICD-10-CM | POA: Diagnosis not present

## 2015-11-14 DIAGNOSIS — Z818 Family history of other mental and behavioral disorders: Secondary | ICD-10-CM

## 2015-11-14 DIAGNOSIS — T148XXA Other injury of unspecified body region, initial encounter: Secondary | ICD-10-CM

## 2015-11-14 DIAGNOSIS — E11649 Type 2 diabetes mellitus with hypoglycemia without coma: Secondary | ICD-10-CM | POA: Diagnosis present

## 2015-11-14 DIAGNOSIS — Z87891 Personal history of nicotine dependence: Secondary | ICD-10-CM | POA: Diagnosis not present

## 2015-11-14 DIAGNOSIS — Z811 Family history of alcohol abuse and dependence: Secondary | ICD-10-CM

## 2015-11-14 DIAGNOSIS — I10 Essential (primary) hypertension: Secondary | ICD-10-CM

## 2015-11-14 DIAGNOSIS — R402411 Glasgow coma scale score 13-15, in the field [EMT or ambulance]: Secondary | ICD-10-CM | POA: Diagnosis not present

## 2015-11-14 DIAGNOSIS — F419 Anxiety disorder, unspecified: Secondary | ICD-10-CM | POA: Diagnosis present

## 2015-11-14 DIAGNOSIS — G40909 Epilepsy, unspecified, not intractable, without status epilepticus: Secondary | ICD-10-CM | POA: Diagnosis present

## 2015-11-14 DIAGNOSIS — E871 Hypo-osmolality and hyponatremia: Secondary | ICD-10-CM | POA: Diagnosis not present

## 2015-11-14 LAB — COMPREHENSIVE METABOLIC PANEL
ALT: 40 U/L (ref 17–63)
AST: 71 U/L — AB (ref 15–41)
Albumin: 3.9 g/dL (ref 3.5–5.0)
Alkaline Phosphatase: 85 U/L (ref 38–126)
Anion gap: 16 — ABNORMAL HIGH (ref 5–15)
BUN: 45 mg/dL — AB (ref 6–20)
CHLORIDE: 117 mmol/L — AB (ref 101–111)
CO2: 20 mmol/L — ABNORMAL LOW (ref 22–32)
Calcium: 8.5 mg/dL — ABNORMAL LOW (ref 8.9–10.3)
Creatinine, Ser: 1.44 mg/dL — ABNORMAL HIGH (ref 0.61–1.24)
GFR, EST AFRICAN AMERICAN: 57 mL/min — AB (ref >60)
GFR, EST NON AFRICAN AMERICAN: 49 mL/min — AB (ref >60)
Glucose, Bld: 62 mg/dL — ABNORMAL LOW (ref 65–99)
POTASSIUM: 4.2 mmol/L (ref 3.5–5.1)
Sodium: 153 mmol/L — ABNORMAL HIGH (ref 135–145)
Total Bilirubin: 2.5 mg/dL — ABNORMAL HIGH (ref 0.3–1.2)
Total Protein: 7.1 g/dL (ref 6.5–8.1)

## 2015-11-14 LAB — URINALYSIS, ROUTINE W REFLEX MICROSCOPIC
Glucose, UA: 100 mg/dL — AB
LEUKOCYTES UA: NEGATIVE
NITRITE: NEGATIVE
PH: 6 (ref 5.0–8.0)
PROTEIN: 30 mg/dL — AB
SPECIFIC GRAVITY, URINE: 1.022 (ref 1.005–1.030)

## 2015-11-14 LAB — RAPID URINE DRUG SCREEN, HOSP PERFORMED
Amphetamines: NOT DETECTED
BENZODIAZEPINES: POSITIVE — AB
Barbiturates: NOT DETECTED
Cocaine: NOT DETECTED
Opiates: NOT DETECTED
Tetrahydrocannabinol: NOT DETECTED

## 2015-11-14 LAB — URINE MICROSCOPIC-ADD ON

## 2015-11-14 LAB — I-STAT CG4 LACTIC ACID, ED: LACTIC ACID, VENOUS: 1.9 mmol/L (ref 0.5–1.9)

## 2015-11-14 LAB — ETHANOL: Alcohol, Ethyl (B): <5 mg/dL (ref <5)

## 2015-11-14 LAB — CBC
HEMATOCRIT: 51.2 % (ref 39.0–52.0)
Hemoglobin: 16.4 g/dL (ref 13.0–17.0)
MCH: 29.1 pg (ref 26.0–34.0)
MCHC: 32 g/dL (ref 30.0–36.0)
MCV: 90.9 fL (ref 78.0–100.0)
Platelets: 312 10*3/uL (ref 150–400)
RBC: 5.63 MIL/uL (ref 4.22–5.81)
RDW: 17.1 % — ABNORMAL HIGH (ref 11.5–15.5)
WBC: 15.6 10*3/uL — AB (ref 4.0–10.5)

## 2015-11-14 LAB — CBG MONITORING, ED
Glucose-Capillary: 58 mg/dL — ABNORMAL LOW (ref 65–99)
Glucose-Capillary: 133 mg/dL — ABNORMAL HIGH (ref 65–99)

## 2015-11-14 LAB — GLUCOSE, CAPILLARY
GLUCOSE-CAPILLARY: 69 mg/dL (ref 65–99)
GLUCOSE-CAPILLARY: 106 mg/dL — AB (ref 65–99)
GLUCOSE-CAPILLARY: 119 mg/dL — AB (ref 65–99)
GLUCOSE-CAPILLARY: 96 mg/dL (ref 65–99)

## 2015-11-14 LAB — MRSA PCR SCREENING: MRSA BY PCR: NEGATIVE

## 2015-11-14 LAB — I-STAT TROPONIN, ED: TROPONIN I, POC: 0.02 ng/mL (ref 0.00–0.08)

## 2015-11-14 LAB — SALICYLATE LEVEL

## 2015-11-14 LAB — VALPROIC ACID LEVEL: VALPROIC ACID LVL: 10 ug/mL — AB (ref 50.0–100.0)

## 2015-11-14 MED ORDER — SODIUM CHLORIDE 0.9 % IV BOLUS (SEPSIS)
500.0000 mL | Freq: Once | INTRAVENOUS | Status: AC
  Administered 2015-11-14: 500 mL via INTRAVENOUS

## 2015-11-14 MED ORDER — VANCOMYCIN HCL 10 G IV SOLR: 1250.0000 mg | INTRAVENOUS | Status: DC

## 2015-11-14 MED ORDER — ASPIRIN EC 81 MG PO TBEC
81.0000 mg | DELAYED_RELEASE_TABLET | Freq: Every day | ORAL | Status: DC
  Administered 2015-11-15 – 2015-11-18 (×4): 81 mg via ORAL
  Filled 2015-11-14 (×4): qty 1

## 2015-11-14 MED ORDER — LORAZEPAM 0.5 MG PO TABS
0.5000 mg | ORAL_TABLET | Freq: Four times a day (QID) | ORAL | Status: DC | PRN
  Administered 2015-11-14: 0.5 mg via ORAL
  Filled 2015-11-14: qty 1

## 2015-11-14 MED ORDER — DEXTROSE 5 % IV SOLN
2.0000 g | INTRAVENOUS | Status: AC
  Administered 2015-11-14: 2 g via INTRAVENOUS
  Filled 2015-11-14: qty 2

## 2015-11-14 MED ORDER — LORAZEPAM 2 MG/ML IJ SOLN
1.0000 mg | Freq: Once | INTRAMUSCULAR | Status: AC | PRN
  Administered 2015-11-14: 1 mg via INTRAVENOUS
  Filled 2015-11-14: qty 1

## 2015-11-14 MED ORDER — VANCOMYCIN HCL 10 G IV SOLR
2000.0000 mg | INTRAVENOUS | Status: AC
  Administered 2015-11-14: 2000 mg via INTRAVENOUS
  Filled 2015-11-14: qty 2000

## 2015-11-14 MED ORDER — ATORVASTATIN CALCIUM 40 MG PO TABS: 40.0000 mg | ORAL_TABLET | Freq: Every day | ORAL | Status: DC

## 2015-11-14 MED ORDER — ATORVASTATIN CALCIUM 40 MG PO TABS
40.0000 mg | ORAL_TABLET | Freq: Every day | ORAL | Status: DC
  Administered 2015-11-15 – 2015-11-17 (×3): 40 mg via ORAL
  Filled 2015-11-14 (×3): qty 1

## 2015-11-14 MED ORDER — CEFEPIME HCL 1 G IJ SOLR
1.0000 g | Freq: Three times a day (TID) | INTRAMUSCULAR | Status: DC
  Filled 2015-11-14: qty 1

## 2015-11-14 MED ORDER — AMLODIPINE BESYLATE 5 MG PO TABS
5.0000 mg | ORAL_TABLET | Freq: Every day | ORAL | Status: DC
  Administered 2015-11-15 – 2015-11-18 (×4): 5 mg via ORAL
  Filled 2015-11-14 (×4): qty 1

## 2015-11-14 MED ORDER — DEXTROSE 5 % IV SOLN
INTRAVENOUS | Status: AC
  Administered 2015-11-14: 18:00:00 via INTRAVENOUS

## 2015-11-14 MED ORDER — LEVETIRACETAM 500 MG PO TABS
500.0000 mg | ORAL_TABLET | Freq: Two times a day (BID) | ORAL | Status: DC
Start: 2015-11-14 — End: 2015-11-18
  Administered 2015-11-14 – 2015-11-18 (×8): 500 mg via ORAL
  Filled 2015-11-14 (×8): qty 1

## 2015-11-14 MED ORDER — SODIUM CHLORIDE 0.9% FLUSH: 3.0000 mL | Freq: Two times a day (BID) | INTRAVENOUS | Status: DC

## 2015-11-14 MED ORDER — ACETAMINOPHEN 325 MG PO TABS: 650.0000 mg | ORAL_TABLET | Freq: Four times a day (QID) | ORAL | Status: DC | PRN

## 2015-11-14 MED ORDER — SENNOSIDES-DOCUSATE SODIUM 8.6-50 MG PO TABS: 1.0000 | ORAL_TABLET | Freq: Every evening | ORAL | Status: DC | PRN

## 2015-11-14 MED ORDER — PANTOPRAZOLE SODIUM 40 MG PO TBEC
40.0000 mg | DELAYED_RELEASE_TABLET | Freq: Every day | ORAL | Status: DC
  Administered 2015-11-15 – 2015-11-18 (×4): 40 mg via ORAL
  Filled 2015-11-14 (×4): qty 1

## 2015-11-14 MED ORDER — DEXTROSE 50 % IV SOLN
1.0000 | Freq: Once | INTRAVENOUS | Status: AC
  Administered 2015-11-14: 50 mL via INTRAVENOUS
  Filled 2015-11-14: qty 50

## 2015-11-14 MED ORDER — AMIODARONE HCL 100 MG PO TABS
100.0000 mg | ORAL_TABLET | Freq: Every day | ORAL | Status: DC
  Administered 2015-11-15 – 2015-11-18 (×4): 100 mg via ORAL
  Filled 2015-11-14 (×4): qty 1

## 2015-11-14 MED ORDER — IPRATROPIUM-ALBUTEROL 0.5-2.5 (3) MG/3ML IN SOLN: 3.0000 mL | Freq: Four times a day (QID) | RESPIRATORY_TRACT | Status: DC | PRN

## 2015-11-14 MED ORDER — ACETAMINOPHEN 650 MG RE SUPP: 650.0000 mg | Freq: Four times a day (QID) | RECTAL | Status: DC | PRN

## 2015-11-14 MED ORDER — DIVALPROEX SODIUM ER 250 MG PO TB24
750.0000 mg | ORAL_TABLET | Freq: Every day | ORAL | Status: DC
  Administered 2015-11-15 – 2015-11-18 (×4): 750 mg via ORAL
  Filled 2015-11-14 (×4): qty 1

## 2015-11-14 MED ORDER — SODIUM CHLORIDE 0.9 % IV BOLUS (SEPSIS)
1000.0000 mL | Freq: Once | INTRAVENOUS | Status: AC
  Administered 2015-11-14: 1000 mL via INTRAVENOUS

## 2015-11-14 MED ORDER — RISPERIDONE 1 MG PO TABS
1.0000 mg | ORAL_TABLET | Freq: Every day | ORAL | Status: DC
  Administered 2015-11-15 – 2015-11-17 (×3): 1 mg via ORAL
  Filled 2015-11-14 (×3): qty 1

## 2015-11-14 MED ORDER — HEPARIN SODIUM (PORCINE) 5000 UNIT/ML IJ SOLN
5000.0000 [IU] | Freq: Three times a day (TID) | INTRAMUSCULAR | Status: DC
  Administered 2015-11-14 – 2015-11-18 (×12): 5000 [IU] via SUBCUTANEOUS
  Filled 2015-11-14 (×12): qty 1

## 2015-11-14 MED ORDER — HYDRALAZINE HCL 20 MG/ML IJ SOLN: 10.0000 mg | INTRAMUSCULAR | Status: DC | PRN

## 2015-11-14 NOTE — ED Notes (Signed)
Hospitalist MD at bedside. 

## 2015-11-14 NOTE — ED Provider Notes (Signed)
Medical screening examination/treatment/procedure(s) were conducted as a shared visit with non-physician practitioner(s) and myself.  I personally evaluated the patient during the encounter.   EKG Interpretation  Date/Time:  Friday November 14 2015 12:08:30 EDT Ventricular Rate:  149 PR Interval:    QRS Duration: 85 QT Interval:  287 QTC Calculation: 452 R Axis:   32 Text Interpretation:  Age not entered, assumed to be  65 years old for purpose of ECG interpretation Atrial fibrillation Ventricular tachycardia, unsustained Nonspecific repol abnormality, diffuse leads Confirmed by Rogene Houston  MD, Kazumi Lachney [phone]) on 11/14/2015 12:38:54 PM Also confirmed by Rogene Houston  MD, Bekah Igoe [phone]), editor [place]  [facility], [place] ([id number])  on 11/14/2015 1:56:13 PM      Results for orders placed or performed during the hospital encounter of 11/14/15  Comprehensive metabolic panel  Result Value Ref Range   Sodium 153 (H) 135 - 145 mmol/L   Potassium 4.2 3.5 - 5.1 mmol/L   Chloride 117 (H) 101 - 111 mmol/L   CO2 20 (L) 22 - 32 mmol/L   Glucose, Bld 62 (L) 65 - 99 mg/dL   BUN 45 (H) 6 - 20 mg/dL   Creatinine, Ser 1.44 (H) 0.61 - 1.24 mg/dL   Calcium 8.5 (L) 8.9 - 10.3 mg/dL   Total Protein 7.1 6.5 - 8.1 g/dL   Albumin 3.9 3.5 - 5.0 g/dL   AST 71 (H) 15 - 41 U/L   ALT 40 17 - 63 U/L   Alkaline Phosphatase 85 38 - 126 U/L   Total Bilirubin 2.5 (H) 0.3 - 1.2 mg/dL   GFR calc non Af Amer 49 (L) >60 mL/min   GFR calc Af Amer 57 (L) >60 mL/min   Anion gap 16 (H) 5 - 15  CBC  Result Value Ref Range   WBC 15.6 (H) 4.0 - 10.5 K/uL   RBC 5.63 4.22 - 5.81 MIL/uL   Hemoglobin 16.4 13.0 - 17.0 g/dL   HCT 51.2 39.0 - 52.0 %   MCV 90.9 78.0 - 100.0 fL   MCH 29.1 26.0 - 34.0 pg   MCHC 32.0 30.0 - 36.0 g/dL   RDW 17.1 (H) 11.5 - 15.5 %   Platelets 312 150 - 400 K/uL  Urinalysis, Routine w reflex microscopic (not at [facility])  Result Value Ref Range   Color, Urine YELLOW YELLOW   APPearance CLEAR CLEAR   Specific  Gravity, Urine 1.022 1.005 - 1.030   pH 6.0 5.0 - 8.0   Glucose, UA 100 (A) NEGATIVE mg/dL   Hgb urine dipstick LARGE (A) NEGATIVE   Bilirubin Urine SMALL (A) NEGATIVE   Ketones, ur >80 (A) NEGATIVE mg/dL   Protein, ur 30 (A) NEGATIVE mg/dL   Nitrite NEGATIVE NEGATIVE   Leukocytes, UA NEGATIVE NEGATIVE  Rapid urine drug screen (hospital performed)  Result Value Ref Range   Opiates NONE DETECTED NONE DETECTED   Cocaine NONE DETECTED NONE DETECTED   Benzodiazepines POSITIVE (A) NONE DETECTED   Amphetamines NONE DETECTED NONE DETECTED   Tetrahydrocannabinol NONE DETECTED NONE DETECTED   Barbiturates NONE DETECTED NONE DETECTED  Ethanol  Result Value Ref Range   Alcohol, Ethyl (B) <5 <5 mg/dL  Salicylate level  Result Value Ref Range   Salicylate Lvl 123456 2.8 - 30.0 mg/dL  Valproic acid level  Result Value Ref Range   Valproic Acid Lvl 10 (L) 50.0 - 100.0 ug/mL  Urine microscopic-add on  Result Value Ref Range   Squamous Epithelial / LPF 0-5 (A) NONE SEEN  WBC, UA 0-5 0 - 5 WBC/hpf   RBC / HPF 0-5 0 - 5 RBC/hpf   Bacteria, UA FEW (A) NONE SEEN   Casts HYALINE CASTS (A) NEGATIVE   Urine-Other MUCOUS PRESENT   CBG monitoring, ED  Result Value Ref Range   Glucose-Capillary 58 (L) 65 - 99 mg/dL  I-stat troponin, ED  Result Value Ref Range   Troponin i, poc 0.02 0.00 - 0.08 ng/mL   Comment 3          I-Stat CG4 Lactic Acid, ED  Result Value Ref Range   Lactic Acid, Venous 1.90 0.5 - 1.9 mmol/L  CBG monitoring, ED  Result Value Ref Range   Glucose-Capillary 133 (H) 65 - 99 mg/dL   Dg Chest 2 View  Result Date: 11/02/2015 CLINICAL DATA:  Cough. EXAM: CHEST  2 VIEW COMPARISON:  10/29/2015 chest radiograph. FINDINGS: Stable cardiomediastinal silhouette with mild cardiomegaly and aortic atherosclerosis. No pneumothorax. No pleural effusion. No pulmonary edema. Patchy opacity in both lower lobes, not appreciably changed. Stable eventration anterior right hemidiaphragm. IMPRESSION:  1. Stable mild cardiomegaly without overt pulmonary edema . 2. Stable mild patchy bilateral lower lobe opacity, nonspecific, favor atelectasis, cannot exclude a component of aspiration or pneumonia. Electronically Signed   By: Ilona Sorrel M.D.   On: 11/02/2015 16:49   Dg Chest 2 View  Result Date: 10/29/2015 CLINICAL DATA:  Status post fall x3 today. EXAM: CHEST  2 VIEW COMPARISON:  PA and lateral chest 10/16/2012, 12/30/2012 07/31/2014 and 12/30/2014. CT chest 07/31/2014. FINDINGS: Asymmetric elevation of the right hemidiaphragm is seen on prior exams. The lungs are clear. Heart size is upper normal. No pneumothorax or pleural effusion. Aortic atherosclerosis noted. IMPRESSION: No acute disease. Electronically Signed   By: Inge Rise M.D.   On: 10/29/2015 14:56   Dg Ribs Unilateral W/chest Right  Result Date: 11/02/2015 CLINICAL DATA:  Right rib pain after fall. EXAM: RIGHT RIBS AND CHEST - 3+ VIEW COMPARISON:  Chest radiograph from earlier today. FINDINGS: There are acute mildly displaced fractures of the anterior right sixth and seventh ribs. No additional fracture. No suspicious focal osseous lesion. Stable eventration of the anterior right hemidiaphragm with patchy bibasilar lung opacities. No overt pneumothorax on these three views. IMPRESSION: 1. Acute mildly displaced anterior right sixth and seventh rib fractures. No overt pneumothorax on these rib views . 2. Stable eventration of the anterior right hemidiaphragm with patchy nonspecific bibasilar lung opacities, favor atelectasis, cannot exclude a component of aspiration or pneumonia. Electronically Signed   By: Ilona Sorrel M.D.   On: 11/02/2015 16:54   Dg Wrist Complete Right  Result Date: 10/29/2015 CLINICAL DATA:  65 year old male status post multiple falls at home. Pain. Initial encounter. EXAM: RIGHT WRIST - COMPLETE 3+ VIEW COMPARISON:  None. FINDINGS: Comminuted and dorsally impacted fracture of the distal right radius with  radiocarpal joint and DRU involvement. Comminuted mildly displaced fracture of the ulnar styloid. Surrounding soft tissue swelling. Mild widening of the scapholunate interval, but otherwise carpal bone alignment appears preserved. Visible metacarpals appear intact, there is foreshortening of the fifth metacarpal. Calcified peripheral vascular disease. IMPRESSION: 1. Comminuted and dorsally impacted distal right radius fracture with radiocarpal and probably also DRU involvement. 2. Comminuted mildly displaced fracture of the right ulnar styloid. 3. Widening of the scapholunate interval suspicious for scapholunate ligament injury. Electronically Signed   By: Genevie Ann M.D.   On: 10/29/2015 14:56   Ct Head Wo Contrast  Result Date: 11/14/2015 CLINICAL DATA:  Auditory hallucinations, flight of ideas and agitation. EXAM: CT HEAD WITHOUT CONTRAST TECHNIQUE: Contiguous axial images were obtained from the base of the skull through the vertex without intravenous contrast. COMPARISON:  10/29/2015 CT FINDINGS: Brain: No evidence of acute large vessel territory infarction, hemorrhage, hydrocephalus, extra-axial collection or mass lesion/mass effect. Superficial and central atrophy with chronic appearing small vessel ischemic disease of periventricular and subcortical white matter. Vascular: No hyperdense vessel or unexpected calcification. Skull: Negative Sinuses/Orbits: Left ocular lens replacement. The visualized paranasal sinuses are clear. Other: Clear mastoids bilaterally. IMPRESSION: Cerebral atrophy with chronic small vessel ischemic disease. No significant change from recent comparison. Electronically Signed   By: [name] Royalty M.D.   On: 11/14/2015 13:28   Ct Head Wo Contrast  Result Date: 10/29/2015 CLINICAL DATA:  Status post fall x3 at home today. The patient reports a blow to the head. Initial encounter. EXAM: CT HEAD WITHOUT CONTRAST CT CERVICAL SPINE WITHOUT CONTRAST TECHNIQUE: Multidetector CT imaging of the  head and cervical spine was performed following the standard protocol without intravenous contrast. Multiplanar CT image reconstructions of the cervical spine were also generated. COMPARISON:  Head and cervical spine CT scan 06/26/2014. FINDINGS: CT HEAD FINDINGS Brain: Cortical atrophy and chronic microvascular ischemic change are identified. No evidence of acute abnormality including hemorrhage, infarct, mass lesion, mass effect, midline shift or abnormal extra-axial fluid collection is identified. No hydrocephalus or pneumocephalus. Vascular: Extensive atherosclerosis is seen. Skull: Intact. Sinuses/Orbits: Unremarkable. Other: None. CT CERVICAL SPINE FINDINGS Alignment: Maintained.  Straightening of lordosis is unchanged. Skull base and vertebrae: Unremarkable. Soft tissues and spinal canal: Unremarkable. Disc levels: Intervertebral disc space height is maintained. Degenerative change at the C1-2 articulation is noted. Upper chest: Emphysematous change in the apices is noted. Other: None. IMPRESSION: No acute abnormality head or cervical spine. Atherosclerosis. Atrophy and chronic microvascular ischemic change. Emphysema. Electronically Signed   By: Inge Rise M.D.   On: 10/29/2015 14:29   Ct Cervical Spine Wo Contrast  Result Date: 10/29/2015 CLINICAL DATA:  Status post fall x3 at home today. The patient reports a blow to the head. Initial encounter. EXAM: CT HEAD WITHOUT CONTRAST CT CERVICAL SPINE WITHOUT CONTRAST TECHNIQUE: Multidetector CT imaging of the head and cervical spine was performed following the standard protocol without intravenous contrast. Multiplanar CT image reconstructions of the cervical spine were also generated. COMPARISON:  Head and cervical spine CT scan 06/26/2014. FINDINGS: CT HEAD FINDINGS Brain: Cortical atrophy and chronic microvascular ischemic change are identified. No evidence of acute abnormality including hemorrhage, infarct, mass lesion, mass effect, midline shift or  abnormal extra-axial fluid collection is identified. No hydrocephalus or pneumocephalus. Vascular: Extensive atherosclerosis is seen. Skull: Intact. Sinuses/Orbits: Unremarkable. Other: None. CT CERVICAL SPINE FINDINGS Alignment: Maintained.  Straightening of lordosis is unchanged. Skull base and vertebrae: Unremarkable. Soft tissues and spinal canal: Unremarkable. Disc levels: Intervertebral disc space height is maintained. Degenerative change at the C1-2 articulation is noted. Upper chest: Emphysematous change in the apices is noted. Other: None. IMPRESSION: No acute abnormality head or cervical spine. Atherosclerosis. Atrophy and chronic microvascular ischemic change. Emphysema. Electronically Signed   By: Inge Rise M.D.   On: 10/29/2015 14:29   Ct Wrist Right Wo Contrast  Result Date: 11/04/2015 CLINICAL DATA:  Muscular dystrophy. Status post fall. Right wrist pain. EXAM: CT OF THE RIGHT WRIST WITHOUT CONTRAST TECHNIQUE: Multidetector CT imaging of the right wrist was performed according to the standard protocol. Multiplanar CT image reconstructions were also generated. COMPARISON:  None. FINDINGS: Bones/Joint/Cartilage  Comminuted fracture of the distal radial metaphysis extending to the epiphysis. 8 mm of distraction of the distal radial articular surface. 10 mm of dorsal displacement of the distal dorsal major fracture fragment. Minimally displaced fracture of the ulnar styloid process. Widening of the scapholunate joint most concerning for scapholunate ligament tear. Generalized osteopenia consistent with disuse. No other fracture or dislocation. 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 Ligaments Suboptimally assessed by CT. Muscles and Tendons Suboptimally evaluated by CT. Soft tissues No fluid collection or hematoma. Generalized soft tissue swelling around the wrist. IMPRESSION: 1. Comminuted fracture of the distal radial metaphysis extending to the epiphysis. 8 mm of distraction  of the distal radial articular surface. 10 mm of dorsal displacement of the distal dorsal major fracture fragment. 2. Minimally displaced fracture of the ulnar styloid process. 3. Widening of the scapholunate joint most concerning for scapholunate ligament tear. Electronically Signed   By: Kathreen Devoid   On: 11/04/2015 11:27   Dg Chest Port 1 View  Result Date: 11/14/2015 CLINICAL DATA:  Altered mental status. History of hypertension and ischemic heart disease. EXAM: PORTABLE CHEST 1 VIEW COMPARISON:  Chest x-ray 11/02/2015 FINDINGS: The heart is enlarged but stable. Mild tortuosity and calcification of the thoracic aorta is stable. Stable eventration of the right hemidiaphragm with overlying vascular crowding and atelectasis. No infiltrates, edema or effusions. IMPRESSION: Stable cardiac enlargement and elevation of the right hemidiaphragm. No infiltrates or effusions. Electronically Signed   By: Marijo Sanes M.D.   On: 11/14/2015 13:01   Dg Hand Complete Right  Result Date: 10/29/2015 CLINICAL DATA:  65 year old male status post multiple falls at home. Pain. Initial encounter. EXAM: RIGHT HAND - COMPLETE 3+ VIEW COMPARISON:  Right wrist series from today reported separately. FINDINGS: See right wrist series from today reported separately regarding distal radius, ulna, and carpal bone injuries. Foreshortening of the right fifth metacarpal where there may be an underlying healed chronic fracture. No acute metacarpal fracture. No acute phalanx fracture. Distal joint spaces are within normal limits for age. IMPRESSION: 1. See right wrist series from today reported separately. 2. No superimposed acute fracture or dislocation identified in the right hand. Electronically Signed   By: Genevie Ann M.D.   On: 10/29/2015 14:57    CRITICAL CARE Performed by: Fredia Sorrow Total critical care time: 30 minutes Critical care time was exclusive of separately billable procedures and treating other patients. Critical  care was necessary to treat or prevent imminent or life-threatening deterioration. Critical care was time spent personally by me on the following activities: development of treatment plan with patient and/or surrogate as well as nursing, discussions with consultants, evaluation of patient's response to treatment, examination of patient, obtaining history from patient or surrogate, ordering and performing treatments and interventions, ordering and review of laboratory studies, ordering and review of radiographic studies, pulse oximetry and re-evaluation of patient's condition.  The patient sent in from rehabilitation facility. For altered mental status. Patient tachycardic confused initial concern was for possible sepsis. Patient's lactic acid was not elevated. Patient was started on nonseptic protocol medication and antibiotics. Now that workup is complete chest x-rays negative head CT is negative. Were finding a significant hyper nature me me of an dehydration with elevated BUN. Patient receiving IV fluids. Patient also was hypoglycemic and needed at the D50.  Patient still tachycardic however with the initial treatments including the amp of D50 is not as confused as he was when he arrived. Patient is calmer. Patient also was given some  Ativan. No hypotension. Will require admission for correction of electrolyte abnormalities and continued IV fluids.  Patient does have a history of behavioral health problems. According to rehabilitation center patient showing signs of confusion starting on Tuesday. So unlikely that this is withdrawal from alcohol since he's been in the hospital and rehabilitation Center for several days. Patient the has been receiving benzodiazepines. Urine drug screen is positive for that   On physical exam the patient is alert not able to follow commands. Heart tachycardic lungs clear bilaterally abdomen soft.   Fredia Sorrow, MD 11/14/15 1422

## 2015-11-14 NOTE — Progress Notes (Signed)
Pt had run of SVT 10 seconds at 1700. Asymptomatic.  MD notified.

## 2015-11-14 NOTE — Progress Notes (Addendum)
Hypoglycemic Event  CBG: 69  Treatment: 15 GM carbohydrate snack  Symptoms: does not appear symptomatic, confused  Follow-up CBG: Time: 1742 CBG Result: 106  Possible Reasons for Event: Poor PO Comments/MD notified: Dr. Bonner Puna E Stephana Morell

## 2015-11-14 NOTE — ED Provider Notes (Signed)
[facility] DEPT Provider Note   CSN: [id number] Arrival date & time: 11/14/15  1123     History   Chief Complaint Chief Complaint  Patient presents with  . Altered Mental Status    HPI Patrick Holt is a 65 y.o. male.  HPI   Patient is a 65 year old male with history of A. fib, anxiety, bipolar, COPD, HTN, HLD, CAD, seizure disorder, recurrent falls, parkinsonism, recently admitted from [id number] hypoxia, cough, shortness of breath was found to have rib fractures from a fall on September 13, improved during hospitalization and was discharged to SNF.  The [facility] facility sent him to the ER today for evaluation of 3-4 days worsening altered mental status. He was given Ativan and risperadone, w/o improvement.  HE became increasingly agitated, was hearing voices, but started to crawl the floor and is rambling with flight of ideas. He was sent to the ER for psychiatric evaluation. He presented to the ER via EMS only oriented to person, and cannot provide medical history or answer questions appropriately. Remaining history is obtained from chart review.   Patient probably has a history of benzo dependence, no documented history of alcohol abuse. When he was discharged with instructions stated to decrease the Xanax use to help decrease his fall risk. Oxybutynin min was also discontinued.  He was discharged with antibiotics and steroids.  Was scheduled to follow up outpatient with neurology for parkinsonian symptoms and falls, and with outpatient orthopedics for MSK injuries including right distal radial fracture.      Past Medical History:  Diagnosis Date  . Anemia   . Anxiety   . Asthma   . Atrial fibrillation ([place]) 01/2012   with RVR  . Bipolar 1 disorder ([place])   . Cancer ([place])   . Collagen vascular disease ([place])   . Conversion disorder 06/2010  . COPD (chronic obstructive pulmonary disease) ([place])   . Dermatomyositis ([place])   . Dermatomyositis ([place])     . Diverticulitis   . Diverticulosis of colon   . Dysrhythmia    "irregular" (11/15/2012)  . Esophageal dysmotility   . Esophageal stricture   . Fibromyalgia   . Gastritis   . GERD (gastroesophageal reflux disease)   . Headache(784.0)    "severe; get them often" (11/15/2012)  . Hiatal hernia   . History of narcotic addiction ([place])   . Hx of adenomatous colonic polyps   . Hyperlipidemia   . Hypertension   . Internal hemorrhoids   . Ischemic heart disease   . Major depression ([place])    with acute psychotic break in 06/2010  . Myocardial infarct ([place])    mulitple (1999, 2000, 2004)  . Narcotic dependence ([place])   . Nephrolithiasis   . Obesity   . OCD (obsessive compulsive disorder)   . Otosclerosis   . Paroxysmal a-fib ([place])   . Peripheral neuropathy ([place])   . Raynaud's disease   . Renal insufficiency   . Rheumatoid arthritis(714.0)   . Sarcoidosis ([place])   . Seizures ([place])   . Subarachnoid hemorrhage ([place]) 01/2012   with subdural  hematoma.   . Type II diabetes mellitus ([place])   . Urge incontinence   . Vertigo     Patient Active Problem List   Diagnosis Date Noted  . Difficulty in walking, not elsewhere classified   . Right wrist fracture   . Closed displaced fracture of ulnar styloid with routine healing   . Scapholunate ligament injury with no instability   .  Falls frequently   . Hypoxia 11/02/2015  . Anxiety 06/06/2015  . Impaired mobility 04/28/2015  . Lumbar spondylosis with myelopathy 03/07/2015  . Nocturia 12/03/2014  . Chronic venous insufficiency 09/23/2014  . Poor social situation 07/30/2014  . Schizophrenia, acute ([place])   . Schizoaffective disorder, unspecified type ([place])   . Decreased visual acuity 06/27/2014  . COPD exacerbation ([place])   . Right sided weakness 01/04/2014  . Essential hypertension, benign   . Atrial fibrillation, unspecified   . Radiculopathy of cervical region   . Polyuria 12/07/2013  . Erectile dysfunction 12/07/2013  .  Diverticulitis 08/01/2013  . Acute diverticulitis 08/01/2013  . Hematochezia 06/04/2013  . Hypokalemia 10/03/2012  . Benign neoplasm of colon 09/27/2012  . Stricture and stenosis of esophagus 09/27/2012  . Diverticulitis of colon without hemorrhage 07/26/2012  . Polypharmacy 03/26/2012  . Seizures ([place]) 02/10/2012  . Atrial fibrillation ([place]) 02/05/2012  . Coronary atherosclerosis of native coronary artery 02/05/2012  . Radicular low back pain 03/09/2011  . History of seizure disorder 01/29/2011  . Irritable bowel syndrome (IBS) 06/24/2010  . Urge incontinence 06/19/2010  . TOBACCO USER 11/23/2008  . DEPRESSION, MAJOR 05/31/2008  . ISCHEMIC HEART DISEASE 05/31/2008  . RAYNAUD'S DISEASE 05/31/2008  . HIATAL HERNIA 05/31/2008  . Rheumatoid arthritis ([place]) 05/31/2008  . FIBROMYALGIA 05/31/2008  . Hyperlipidemia 01/26/2008  . Anxiety state 01/26/2008  . HEMORRHOIDS, INTERNAL 01/26/2008  . COPD (chronic obstructive pulmonary disease) ([place]) 01/26/2008  . ESOPHAGEAL MOTILITY DISORDER 01/26/2008  . GERD 01/26/2008  . Dermatomyositis ([place]) 01/26/2008    Past Surgical History:  Procedure Laterality Date  . BACK SURGERY    . CARDIAC CATHETERIZATION    . CARPAL TUNNEL RELEASE Bilateral   . CATARACT EXTRACTION W/ INTRAOCULAR LENS IMPLANT Left   . COLONOSCOPY N/A 09/27/2012   Procedure: COLONOSCOPY;  Surgeon: Lafayette Dragon, MD;  Location: [facility] ENDOSCOPY;  Service: Endoscopy;  Laterality: N/A;  . ESOPHAGOGASTRODUODENOSCOPY N/A 09/27/2012   Procedure: ESOPHAGOGASTRODUODENOSCOPY (EGD);  Surgeon: Lafayette Dragon, MD;  Location: [facility];  Service: Endoscopy;  Laterality: N/A;  . KNEE ARTHROSCOPY W/ MENISCAL REPAIR Left 2009  . LUMBAR DISC SURGERY    . LYMPH NODE DISSECTION Right 1970's   "neck; dr thought I had Hodgkins; turned out to be sarcoidosis" (11/15/2012)  . squamous papilloma   2010   removed by Dr. Constance Holster ENT, noted on tongue  . TONSILLECTOMY         Home Medications     Prior to Admission medications   Medication Sig Start Date End Date Taking? Authorizing Provider  ALPRAZolam Duanne Moron) 0.5 MG tablet Take 1 tablet (0.5 mg total) by mouth 3 (three) times daily as needed for anxiety. Patient taking differently: Take 0.5 mg by mouth every 8 (eight) hours as needed for anxiety.  11/05/15  Yes [place] Bing, DO  amiodarone (PACERONE) 200 MG tablet Take 0.5 tablets (100 mg total) by mouth daily. 10/17/15  Yes [name], MD  amLODipine (NORVASC) 5 MG tablet Take 1 tablet (5 mg total) by mouth daily. 11/05/15  Yes [place] Bing, DO  aspirin EC 81 MG tablet Take 1 tablet (81 mg total) by mouth daily. 10/17/15  Yes [name], MD  atorvastatin (LIPITOR) 40 MG tablet Take 1 tablet (40 mg total) by mouth daily. Patient taking differently: Take 40 mg by mouth at bedtime.  10/17/15  Yes [name], MD  divalproex (DEPAKOTE ER) 250 MG 24 hr tablet Take 2 tablets (500 mg total)  by mouth daily. Patient taking differently: Take 750 mg by mouth daily.  10/17/15  Yes [name], MD  guaiFENesin (MUCINEX) 600 MG 12 hr tablet Take 1 tablet (600 mg total) by mouth 2 (two) times daily. OTC 10/17/15  Yes [name], MD  levETIRAcetam (KEPPRA) 500 MG tablet TAKE 1 TABLET(500 MG) BY MOUTH TWICE DAILY 10/17/15  Yes [name], MD  loperamide (IMODIUM) 2 MG capsule TAKE 1 TO 2 CAPSULES BY MOUTH 3 TIMES A DAY AS NEEDED FOR DIARRHEA Patient taking differently: Take 2 mg by mouth every 8 (eight) hours as needed for diarrhea or loose stools.  08/20/15  Yes [name], MD  LORazepam (ATIVAN) 1 MG tablet Take 1 mg by mouth daily as needed for anxiety (agitation).   Yes Historical Provider, MD  meclizine (ANTIVERT) 25 MG tablet TAKE 1 TABLET (25 MG TOTAL) BY MOUTH 3 (THREE) TIMES DAILY AS NEEDED. Patient taking differently: Take 25 mg by mouth every 8 (eight) hours as needed for dizziness. TAKE 1 TABLET (25 MG TOTAL) BY MOUTH 3 (THREE) TIMES DAILY AS NEEDED. 10/17/15  Yes [name], MD   Multiple Vitamin (MULTIVITAMIN WITH MINERALS) TABS tablet Take 1 tablet by mouth daily. 10/17/15  Yes [name], MD  naproxen (NAPROSYN) 500 MG tablet Take 1 tablet (500 mg total) by mouth 2 (two) times daily with a meal. 10/17/15  Yes [name], MD  omeprazole (PRILOSEC) 20 MG capsule Take 20 mg by mouth daily.   Yes Historical Provider, MD  Potassium Chloride ER 20 MEQ TBCR Take 1 tablet by mouth every other day.  11/29/14  Yes Historical Provider, MD  risperiDONE (RISPERDAL) 1 MG tablet Take 1 mg by mouth 3 (three) times daily.    Yes Historical Provider, MD  clonazePAM (KLONOPIN) 1 MG tablet Take 1 mg by mouth 2 (two) times daily.    Historical Provider, MD    Family History Family History  Problem Relation Age of Onset  . Alcohol abuse Mother   . Depression Mother   . Heart disease Mother   . Diabetes Mother   . Stroke Mother   . Coronary artery disease Father   . Diabetes Other     1/2 brother  . Hepatitis Brother     Social History Social History  Substance Use Topics  . Smoking status: Former Smoker    Packs/day: 0.50    Years: 30.00    Types: Cigarettes    Quit date: 12/15/2014  . Smokeless tobacco: Never Used     Comment: 3 cigs a day  . Alcohol use No     Comment: Alcohol stopped in September of 2014     Allergies   Immune globulins; Ciprofloxacin; Flagyl [metronidazole]; Lisinopril; Sulfa antibiotics; Requip [ropinirole hcl]; Betamethasone dipropionate; Bupropion hcl; Clobetasol; Codeine; Escitalopram oxalate; Fluoxetine hcl; Fluticasone-salmeterol; Furosemide; Paroxetine; Penicillins; Tacrolimus; Tetanus toxoid; and Tuberculin purified protein derivative   Review of Systems Review of Systems  Unable to perform ROS: Mental status change     Physical Exam Updated Vital Signs BP 99/61 (BP Location: Right Arm)   Pulse (!) 47   Temp 99.6 F (37.6 C) (Rectal)   Resp 18   Ht 5\' 7"  (1.702 m)   Wt 101.2 kg   SpO2 94%   BMI 34.93 kg/m   Physical Exam   Constitutional: He appears well-developed.  chronically ill appearing pale, confused, speaking rapidly  HENT:  Head: Normocephalic and atraumatic.  Right Ear: External ear  normal.  Left Ear: External ear normal.  Nose: Nose normal.  Oral mucosa dry  Eyes: Conjunctivae and EOM are normal. Pupils are equal, round, and reactive to light. Right eye exhibits no discharge. Left eye exhibits no discharge. No scleral icterus.  Neck: Normal range of motion. No tracheal deviation present.  Cardiovascular: Intact distal pulses.  An irregular rhythm present. Tachycardia present.  Exam reveals no gallop and no friction rub.   No murmur heard. Pulmonary/Chest: Effort normal. No stridor.  CTA anteriorly and laterally, no wheeze  Abdominal: Soft. Bowel sounds are normal. He exhibits no distension. There is no tenderness. There is no guarding.  Musculoskeletal: Normal range of motion.  Neurological: He is alert. He has normal strength. He is disoriented. GCS eye subscore is 4. GCS verbal subscore is 4. GCS motor subscore is 5.  Skin: Skin is warm. No rash noted.  Psychiatric: He has a normal mood and affect. His speech is rapid and/or pressured and tangential. Cognition and memory are impaired.  Nursing note and vitals reviewed.    ED Treatments / Results  Labs (all labs ordered are listed, but only abnormal results are displayed) Labs Reviewed  CBC - Abnormal; Notable for the following:       Result Value   WBC 15.6 (*)    RDW 17.1 (*)    All other components within normal limits  CBG MONITORING, ED - Abnormal; Notable for the following:    Glucose-Capillary 58 (*)    All other components within normal limits  CULTURE, BLOOD (ROUTINE X 2)  CULTURE, BLOOD (ROUTINE X 2)  URINE CULTURE  COMPREHENSIVE METABOLIC PANEL  URINALYSIS, ROUTINE W REFLEX MICROSCOPIC (NOT AT [facility])  URINE RAPID DRUG SCREEN, HOSP PERFORMED  ETHANOL  SALICYLATE LEVEL  VALPROIC ACID LEVEL  I-STAT TROPOININ, ED  I-STAT  CG4 LACTIC ACID, ED  I-STAT CG4 LACTIC ACID, ED    EKG  EKG Interpretation None       Radiology No results found.  Procedures Procedures (including critical care time)  Medications Ordered in ED Medications  LORazepam (ATIVAN) injection 1 mg (not administered)  dextrose 50 % solution 50 mL (not administered)     Initial Impression / Assessment and Plan / ED Course  I have reviewed the triage vital signs and the nursing notes.  Pertinent labs & imaging results that were available during my care of the patient were reviewed by me and considered in my medical decision making (see chart for details).  Clinical Course   Pt with AMS, brought from SNF to ED for psych eval, he appears ill, is altered, oriented to person, unable to lend hx.  He is alert, rambling with flight of ideas, hx of psychiatric disease.    Will need admission for AMS, hypovolemic hypernatremia.  NA 153, BUN/sCr 45/1.44, increased from recent labs significant for AKI.  Pt briefly hypoglycemic CBG 53, given 1 amp D50, monitoring.  Head CT negative. Pt had low grade fever, broad spectrum abx started initially for unknown source.  CXR negative for infiltrates or effusions.  Maintaining sat's, no distress.  UA negative for UTI, consistent with dehydration. Lactic acid negative.  Leukocytosis 15.6 Pt with known A.Fib  Case discussed with Dr. Rogene Houston who advised NS.  Broad spectrum abx initiated.  Pt to be admitted by Dr. Wynetta Emery for AMS, hypernatremia, dehydration   Final Clinical Impressions(s) / ED Diagnoses   Final diagnoses:  None    New Prescriptions New Prescriptions   No medications on file  Delsa Grana, PA-C 12/03/15 [id number]    Fredia Sorrow, MD 12/04/15 1538

## 2015-11-14 NOTE — Progress Notes (Addendum)
Pharmacy Antibiotic Note  Patrick Holt is a 65 y.o. male admitted on 11/14/2015 with sepsis.  Pharmacy has been consulted for Vancomycin and Cefepime dosing. Noted penicillin allergy but patient as tolerated multiple cephalosporins in the past.  BMET in process.  Plan: Cefepime 2g x 1. Vancomycin 2g x 1. F/u SCr for determination of maintenance dosing.  ADDENDUM: 11/14/2015 1:00 PM Pt with AKI, SCr 1.44 Plan: Following 1x doses in ED, start - Cefepime 1g IV q8h - Vancomycin 1250 mg IV q24h for CrCl~50 ml/min/1.72m2 (normalized) F/u SCr, vanc levels as indicated, cultures, clinical course.  Height: 5\' 7"  (170.2 cm) Weight: 223 lb (101.2 kg) IBW/kg (Calculated) : 66.1  Temp (24hrs), Avg:99.6 F (37.6 C), Min:99.6 F (37.6 C), Max:99.6 F (37.6 C)   Recent Labs Lab 11/14/15 1154  WBC 15.6*    Estimated Creatinine Clearance: 76.2 mL/min (by C-G formula based on SCr of 1.08 mg/dL).    Allergies  Allergen Reactions  . Immune Globulins Other (See Comments)    Acute renal failure  . Ciprofloxacin Swelling  . Flagyl [Metronidazole] Swelling  . Lisinopril Diarrhea  . Sulfa Antibiotics Other (See Comments)    blisters  . Requip [Ropinirole Hcl]     constipation  . Betamethasone Dipropionate Other (See Comments)    Unknown  . Bupropion Hcl Other (See Comments)    Unknown  . Clobetasol Other (See Comments)    Unknown  . Codeine Other (See Comments)    Unknown  . Escitalopram Oxalate Other (See Comments)    Unknown  . Fluoxetine Hcl Other (See Comments)    Unknown  . Fluticasone-Salmeterol Other (See Comments)    Unknown  . Furosemide Other (See Comments)    Unknown  . Paroxetine Other (See Comments)    Unknown  . Penicillins Other (See Comments)    Unknown reaction Has patient had a PCN reaction causing immediate rash, facial/tongue/throat swelling, SOB or lightheadedness with hypotension: unknown Has patient had a PCN reaction causing severe rash involving  mucus membranes or skin necrosis: unknown Has patient had a PCN reaction that required hospitalization unknown Has patient had a PCN reaction occurring within the last 10 years: unknown If all of the above answers are "NO", then may proceed with Cephalosporin use.   . Tacrolimus Other (See Comments)    Unknown  . Tetanus Toxoid Other (See Comments)    Unknown  . Tuberculin Purified Protein Derivative Other (See Comments)    Unknown    Antimicrobials this admission: 9/29 Vanc >>  9/29 Cefepime >>   Dose adjustments this admission: -  Microbiology results: 9/29 BCx:  9/29 UCx:    Thank you for allowing pharmacy to be a part of this patient's care.  Hershal Coria 11/14/2015 12:27 PM

## 2015-11-14 NOTE — H&P (Signed)
History and Physical  Patrick Holt [id number] DOB: 1950-08-11 DOA: 11/14/2015  Referring physician: Tacey Heap PCP: [name], MD   Chief Complaint: Altered Mental Status  HPI: Patrick Holt is a 65 y.o. male with history of A. fib, anxiety, bipolar, COPD, HTN, HLD, CAD, seizure disorder, recurrent falls, parkinsonism, recently admitted from [id number] hypoxia, cough, shortness of breath was found to have rib fractures from a fall on September 13, improved during hospitalization and was discharged to SNF.  The [facility] facility sent him to the ER today for evaluation of 3-4 days worsening altered mental status. He was given Ativan and risperadone, w/o improvement.  HE became increasingly agitated, was hearing voices, but started to crawl the floor and is rambling with flight of ideas. He was sent to the ER for psychiatric evaluation. He presented to the ER via EMS only oriented to person, and cannot provide medical history or answer questions appropriately. Remaining history is obtained from chart review.  Patient has a history of benzo dependence, no documented history of alcohol abuse.   In the ED he was noted to be severely dehydrated with hypernatremia and other electrolyte abnormalities.  He is being admitted for further evaluation and management.      Review of Systems: All systems reviewed and apart from history of presenting illness, are negative.  Past Medical History:  Diagnosis Date  . Anemia   . Anxiety   . Asthma   . Atrial fibrillation ([place]) 01/2012   with RVR  . Bipolar 1 disorder ([place])   . Cancer ([place])   . Collagen vascular disease ([place])   . Conversion disorder 06/2010  . COPD (chronic obstructive pulmonary disease) ([place])   . Dermatomyositis ([place])   . Dermatomyositis ([place])   . Diverticulitis   . Diverticulosis of colon   . Dysrhythmia    "irregular" (11/15/2012)  . Esophageal dysmotility   . Esophageal stricture   . Fibromyalgia    . Gastritis   . GERD (gastroesophageal reflux disease)   . Headache(784.0)    "severe; get them often" (11/15/2012)  . Hiatal hernia   . History of narcotic addiction ([place])   . Hx of adenomatous colonic polyps   . Hyperlipidemia   . Hypertension   . Internal hemorrhoids   . Ischemic heart disease   . Major depression ([place])    with acute psychotic break in 06/2010  . Myocardial infarct ([place])    mulitple (1999, 2000, 2004)  . Narcotic dependence ([place])   . Nephrolithiasis   . Obesity   . OCD (obsessive compulsive disorder)   . Otosclerosis   . Paroxysmal a-fib ([place])   . Peripheral neuropathy ([place])   . Raynaud's disease   . Renal insufficiency   . Rheumatoid arthritis(714.0)   . Sarcoidosis ([place])   . Seizures ([place])   . Subarachnoid hemorrhage ([place]) 01/2012   with subdural  hematoma.   . Type II diabetes mellitus ([place])   . Urge incontinence   . Vertigo    Past Surgical History:  Procedure Laterality Date  . BACK SURGERY    . CARDIAC CATHETERIZATION    . CARPAL TUNNEL RELEASE Bilateral   . CATARACT EXTRACTION W/ INTRAOCULAR LENS IMPLANT Left   . COLONOSCOPY N/A 09/27/2012   Procedure: COLONOSCOPY;  Surgeon: Lafayette Dragon, MD;  Location: [facility];  Service: Endoscopy;  Laterality: N/A;  . ESOPHAGOGASTRODUODENOSCOPY N/A 09/27/2012   Procedure: ESOPHAGOGASTRODUODENOSCOPY (EGD);  Surgeon: Lafayette Dragon, MD;  Location:  [facility];  Service: Endoscopy;  Laterality: N/A;  . KNEE ARTHROSCOPY W/ MENISCAL REPAIR Left 2009  . LUMBAR DISC SURGERY    . LYMPH NODE DISSECTION Right 1970's   "neck; dr thought I had Hodgkins; turned out to be sarcoidosis" (11/15/2012)  . squamous papilloma   2010   removed by Dr. Constance Holster ENT, noted on tongue  . TONSILLECTOMY     Social History:  reports that he quit smoking about 10 months ago. His smoking use included Cigarettes. He has a 15.00 pack-year smoking history. He has never used smokeless tobacco. He reports that he does not drink alcohol or  use drugs.   Allergies  Allergen Reactions  . Immune Globulins Other (See Comments)    Acute renal failure  . Ciprofloxacin Swelling  . Flagyl [Metronidazole] Swelling  . Lisinopril Diarrhea  . Sulfa Antibiotics Other (See Comments)    blisters  . Requip [Ropinirole Hcl]     constipation  . Betamethasone Dipropionate Other (See Comments)    Unknown  . Bupropion Hcl Other (See Comments)    Unknown  . Clobetasol Other (See Comments)    Unknown  . Codeine Other (See Comments)    Unknown  . Escitalopram Oxalate Other (See Comments)    Unknown  . Fluoxetine Hcl Other (See Comments)    Unknown  . Fluticasone-Salmeterol Other (See Comments)    Unknown  . Furosemide Other (See Comments)    Unknown  . Paroxetine Other (See Comments)    Unknown  . Penicillins Other (See Comments)    Unknown reaction Has patient had a PCN reaction causing immediate rash, facial/tongue/throat swelling, SOB or lightheadedness with hypotension: unknown Has patient had a PCN reaction causing severe rash involving mucus membranes or skin necrosis: unknown Has patient had a PCN reaction that required hospitalization unknown Has patient had a PCN reaction occurring within the last 10 years: unknown If all of the above answers are "NO", then may proceed with Cephalosporin use.   . Tacrolimus Other (See Comments)    Unknown  . Tetanus Toxoid Other (See Comments)    Unknown  . Tuberculin Purified Protein Derivative Other (See Comments)    Unknown    Family History  Problem Relation Age of Onset  . Alcohol abuse Mother   . Depression Mother   . Heart disease Mother   . Diabetes Mother   . Stroke Mother   . Coronary artery disease Father   . Diabetes Other     1/2 brother  . Hepatitis Brother      Prior to Admission medications   Medication Sig Start Date End Date Taking? Authorizing Provider  ALPRAZolam Duanne Moron) 0.5 MG tablet Take 1 tablet (0.5 mg total) by mouth 3 (three) times daily as  needed for anxiety. Patient taking differently: Take 0.5 mg by mouth every 8 (eight) hours as needed for anxiety.  11/05/15  Yes [place] Bing, DO  amiodarone (PACERONE) 200 MG tablet Take 0.5 tablets (100 mg total) by mouth daily. 10/17/15  Yes [name], MD  amLODipine (NORVASC) 5 MG tablet Take 1 tablet (5 mg total) by mouth daily. 11/05/15  Yes [place] Bing, DO  aspirin EC 81 MG tablet Take 1 tablet (81 mg total) by mouth daily. 10/17/15  Yes [name], MD  atorvastatin (LIPITOR) 40 MG tablet Take 1 tablet (40 mg total) by mouth daily. Patient taking differently: Take 40 mg by mouth at bedtime.  10/17/15  Yes [name], MD  divalproex (DEPAKOTE ER) 250 MG 24 hr tablet Take 2 tablets (500 mg total) by mouth daily. Patient taking differently: Take 750 mg by mouth daily.  10/17/15  Yes [name], MD  guaiFENesin (MUCINEX) 600 MG 12 hr tablet Take 1 tablet (600 mg total) by mouth 2 (two) times daily. OTC 10/17/15  Yes [name], MD  levETIRAcetam (KEPPRA) 500 MG tablet TAKE 1 TABLET(500 MG) BY MOUTH TWICE DAILY 10/17/15  Yes [name], MD  loperamide (IMODIUM) 2 MG capsule TAKE 1 TO 2 CAPSULES BY MOUTH 3 TIMES A DAY AS NEEDED FOR DIARRHEA Patient taking differently: Take 2 mg by mouth every 8 (eight) hours as needed for diarrhea or loose stools.  08/20/15  Yes [name], MD  LORazepam (ATIVAN) 1 MG tablet Take 1 mg by mouth daily as needed for anxiety (agitation).   Yes Historical Provider, MD  meclizine (ANTIVERT) 25 MG tablet TAKE 1 TABLET (25 MG TOTAL) BY MOUTH 3 (THREE) TIMES DAILY AS NEEDED. Patient taking differently: Take 25 mg by mouth every 8 (eight) hours as needed for dizziness. TAKE 1 TABLET (25 MG TOTAL) BY MOUTH 3 (THREE) TIMES DAILY AS NEEDED. 10/17/15  Yes [name], MD  Multiple Vitamin (MULTIVITAMIN WITH MINERALS) TABS tablet Take 1 tablet by mouth daily. 10/17/15  Yes [name], MD  naproxen (NAPROSYN) 500 MG tablet Take 1 tablet (500 mg total) by mouth 2 (two) times  daily with a meal. 10/17/15  Yes [name], MD  omeprazole (PRILOSEC) 20 MG capsule Take 20 mg by mouth daily.   Yes Historical Provider, MD  Potassium Chloride ER 20 MEQ TBCR Take 1 tablet by mouth every other day.  11/29/14  Yes Historical Provider, MD  risperiDONE (RISPERDAL) 1 MG tablet Take 1 mg by mouth 3 (three) times daily.    Yes Historical Provider, MD  clonazePAM (KLONOPIN) 1 MG tablet Take 1 mg by mouth 2 (two) times daily.    Historical Provider, MD   Physical Exam: Vitals:   11/14/15 1138 11/14/15 1142 11/14/15 1326  BP:  99/61 (!) 169/137  Pulse:  (!) 47 97  Resp:  18 18  Temp:  99.6 F (37.6 C)   TempSrc:  Rectal   SpO2:  94%   Weight: 101.2 kg (223 lb)    Height: 5\' 7"  (1.702 m)       General exam: Moderately built and nourished patient, lying comfortably supine on the gurney in no obvious distress.  Head, eyes and ENT: Nontraumatic and normocephalic. Pupils equally reacting to light and accommodation. Oral mucosa very dry.  Neck: Supple. No JVD, carotid bruit or thyromegaly.  Lymphatics: No lymphadenopathy.  Respiratory system: Clear to auscultation. No increased work of breathing.  Cardiovascular system: S1 and S2 heard, tachycardic. No JVD, murmurs, gallops, clicks or pedal edema.  Gastrointestinal system: Abdomen is nondistended, soft and nontender. Normal bowel sounds heard. No organomegaly or masses appreciated.  Central nervous system: Alert and oriented. No focal neurological deficits.  Extremities: Symmetric 5 x 5 power. Peripheral pulses symmetrically felt.   Skin: No rashes or acute findings.  Musculoskeletal system: Negative exam.  Psychiatry: Pleasant and cooperative but confused.    Labs on Admission:  Basic Metabolic Panel:  Recent Labs Lab 11/14/15 1154  NA 153*  K 4.2  CL 117*  CO2 20*  GLUCOSE 62*  BUN 45*  CREATININE 1.44*  CALCIUM 8.5*   Liver Function Tests:  Recent Labs Lab 11/14/15 1154  AST 71*  ALT 40    ALKPHOS 85  BILITOT 2.5*  PROT 7.1  ALBUMIN 3.9   No results for input(s): LIPASE, AMYLASE in the last 168 hours. No results for input(s): AMMONIA in the last 168 hours. CBC:  Recent Labs Lab 11/14/15 1154  WBC 15.6*  HGB 16.4  HCT 51.2  MCV 90.9  PLT 312   Cardiac Enzymes: No results for input(s): CKTOTAL, CKMB, CKMBINDEX, TROPONINI in the last 168 hours.  BNP (last 3 results) No results for input(s): PROBNP in the last 8760 hours. CBG:  Recent Labs Lab 11/14/15 1155 11/14/15 1325  GLUCAP 58* 133*    Radiological Exams on Admission: Ct Head Wo Contrast  Result Date: 11/14/2015 CLINICAL DATA:  Auditory hallucinations, flight of ideas and agitation. EXAM: CT HEAD WITHOUT CONTRAST TECHNIQUE: Contiguous axial images were obtained from the base of the skull through the vertex without intravenous contrast. COMPARISON:  10/29/2015 CT FINDINGS: Brain: No evidence of acute large vessel territory infarction, hemorrhage, hydrocephalus, extra-axial collection or mass lesion/mass effect. Superficial and central atrophy with chronic appearing small vessel ischemic disease of periventricular and subcortical white matter. Vascular: No hyperdense vessel or unexpected calcification. Skull: Negative Sinuses/Orbits: Left ocular lens replacement. The visualized paranasal sinuses are clear. Other: Clear mastoids bilaterally. IMPRESSION: Cerebral atrophy with chronic small vessel ischemic disease. No significant change from recent comparison. Electronically Signed   By: [name] Royalty M.D.   On: 11/14/2015 13:28   Dg Chest Port 1 View  Result Date: 11/14/2015 CLINICAL DATA:  Altered mental status. History of hypertension and ischemic heart disease. EXAM: PORTABLE CHEST 1 VIEW COMPARISON:  Chest x-ray 11/02/2015 FINDINGS: The heart is enlarged but stable. Mild tortuosity and calcification of the thoracic aorta is stable. Stable eventration of the right hemidiaphragm with overlying vascular crowding  and atelectasis. No infiltrates, edema or effusions. IMPRESSION: Stable cardiac enlargement and elevation of the right hemidiaphragm. No infiltrates or effusions. Electronically Signed   By: Marijo Sanes M.D.   On: 11/14/2015 13:01   EKG: Independently reviewed.   Assessment/Plan Principal Problem:   Altered mental state Active Problems:   Severe dehydration   Hypernatremia   DEPRESSION, MAJOR   TOBACCO USER   ISCHEMIC HEART DISEASE   RAYNAUD'S DISEASE   COPD (chronic obstructive pulmonary disease) (HCC)   GERD   Atrial fibrillation (HCC)   Coronary atherosclerosis of native coronary artery  1. Severe Dehydration - encouraging oral fluids and IV D5W ordered. Monitor electrolytes closely. 2. Altered mental status / toxic metabolic encephalopathy secondary to medications and severe dehydration-hopefully will improve with hydration and weaning the benzodiazepines. Do frequent neuro checks.  Did not find evidence of an active infection at this time.  He did receive antibiotics in ED but not continued.  Follow cultures.  Consider repeating chest xray after adequate hydration.  The nursing did not report that he has had a seizure.  He could be postictal.  Will check EEG.  Continuing his antiepileptics. Ordered serum levels.  3. Acute hypernatremia-secondary to severe dehydration-monitor BMP, D5W fluids ordered. 4. Hypoglycemia - secondary to poor oral intake, consulting dietitian, added dextrose to IVFs.  5. Acute Renal Insufficiency-prerenal, repeat BMP in the morning after hydration. 6. Chronic COPD-appears compensated at this time, continue home meds. 7. CAD - troponin within normal limits.  8. Tobacco use-patient was too confused to counsel at this time area. 9. GERD-Protonix for GI protection. 10. Seizure disorder - resume antiepileptics, seizure precautions.  11. Depression and Anxiety-he has been  on several different benzodiazepines which we will be weaning. 12. Atrial  Fibrillation-stable on home medications. 13. Hyperbilirubinemia - recheck in AM after fluid hydration.    Irwin Brakeman, MD [facility] Pager [phone]  If 7PM-7AM, please contact night-coverage [email or web address] Password [email or web address] 11/14/2015, 2:58 PM

## 2015-11-14 NOTE — ED Triage Notes (Signed)
Per EMS patient comes from [facility] for [place].  Patient has been there 9 days for strengthening rehab.  On Tuesday staff reports that patient would tell them he was hearing voices.  Patient today became more agitated by getting in the floor and not doing what staff was asking patient to do.   Patient is currently rambling with flight of ideas.

## 2015-11-15 DIAGNOSIS — R4182 Altered mental status, unspecified: Secondary | ICD-10-CM | POA: Diagnosis not present

## 2015-11-15 LAB — CBC
HCT: 45.7 % (ref 39.0–52.0)
HEMOGLOBIN: 15.5 g/dL (ref 13.0–17.0)
MCH: 30.2 pg (ref 26.0–34.0)
MCHC: 33.9 g/dL (ref 30.0–36.0)
MCV: 89.1 fL (ref 78.0–100.0)
PLATELETS: 225 10*3/uL (ref 150–400)
RBC: 5.13 MIL/uL (ref 4.22–5.81)
RDW: 16.9 % — ABNORMAL HIGH (ref 11.5–15.5)
WBC: 11.2 10*3/uL — ABNORMAL HIGH (ref 4.0–10.5)

## 2015-11-15 LAB — COMPREHENSIVE METABOLIC PANEL
ALBUMIN: 3.2 g/dL — AB (ref 3.5–5.0)
ALT: 42 U/L (ref 17–63)
ANION GAP: 5 (ref 5–15)
AST: 63 U/L — ABNORMAL HIGH (ref 15–41)
Alkaline Phosphatase: 67 U/L (ref 38–126)
BUN: 26 mg/dL — ABNORMAL HIGH (ref 6–20)
CHLORIDE: 117 mmol/L — AB (ref 101–111)
CO2: 27 mmol/L (ref 22–32)
Calcium: 8.1 mg/dL — ABNORMAL LOW (ref 8.9–10.3)
Creatinine, Ser: 0.83 mg/dL (ref 0.61–1.24)
GFR calc non Af Amer: >60 mL/min (ref >60)
GLUCOSE: 104 mg/dL — AB (ref 65–99)
Potassium: 3.5 mmol/L (ref 3.5–5.1)
SODIUM: 149 mmol/L — AB (ref 135–145)
Total Bilirubin: 1.9 mg/dL — ABNORMAL HIGH (ref 0.3–1.2)
Total Protein: 5.9 g/dL — ABNORMAL LOW (ref 6.5–8.1)

## 2015-11-15 LAB — BASIC METABOLIC PANEL
ANION GAP: 4 — AB (ref 5–15)
BUN: 29 mg/dL — ABNORMAL HIGH (ref 6–20)
CO2: 25 mmol/L (ref 22–32)
Calcium: 7.7 mg/dL — ABNORMAL LOW (ref 8.9–10.3)
Chloride: 119 mmol/L — ABNORMAL HIGH (ref 101–111)
Creatinine, Ser: 0.93 mg/dL (ref 0.61–1.24)
GFR calc Af Amer: >60 mL/min (ref >60)
GFR calc non Af Amer: >60 mL/min (ref >60)
GLUCOSE: 97 mg/dL (ref 65–99)
POTASSIUM: 3.8 mmol/L (ref 3.5–5.1)
Sodium: 148 mmol/L — ABNORMAL HIGH (ref 135–145)

## 2015-11-15 LAB — GLUCOSE, CAPILLARY
GLUCOSE-CAPILLARY: 104 mg/dL — AB (ref 65–99)
GLUCOSE-CAPILLARY: 94 mg/dL (ref 65–99)
GLUCOSE-CAPILLARY: 93 mg/dL (ref 65–99)
GLUCOSE-CAPILLARY: 110 mg/dL — AB (ref 65–99)
GLUCOSE-CAPILLARY: 127 mg/dL — AB (ref 65–99)
Glucose-Capillary: 133 mg/dL — ABNORMAL HIGH (ref 65–99)
Glucose-Capillary: 150 mg/dL — ABNORMAL HIGH (ref 65–99)
Glucose-Capillary: 84 mg/dL (ref 65–99)
Glucose-Capillary: 91 mg/dL (ref 65–99)

## 2015-11-15 LAB — URINE CULTURE: CULTURE: NO GROWTH

## 2015-11-15 MED ORDER — DEXTROSE 5 % IV SOLN
INTRAVENOUS | Status: DC
  Administered 2015-11-15 (×2): via INTRAVENOUS

## 2015-11-15 MED ORDER — CLONAZEPAM 0.5 MG PO TABS
0.5000 mg | ORAL_TABLET | Freq: Every day | ORAL | Status: DC
  Administered 2015-11-15 – 2015-11-18 (×4): 0.5 mg via ORAL
  Filled 2015-11-15 (×4): qty 1

## 2015-11-15 NOTE — Progress Notes (Signed)
CSW consulted to assist with d/c planning. Pt was recently hospitalized at [facility] and d/c to [facility] care ( 11/02/15-11/05/15 ). Please see psychosocial assessment dated 11/04/15. CSW met with pt today to assist with d/c planning. No family at bedside. Pt reports that he will return to [facility] at d/c. SNF contacted [facility] [phone] ) and confirmed pt can return on Sunday if stable for d/c. Clinicals have been sent to SNF for review. Weekend CSW will assist with d/c planning to [facility] when stable.  Werner Lean [phone]

## 2015-11-15 NOTE — Progress Notes (Signed)
PROGRESS NOTE                                                                                                                                                                                                             Patient Demographics:    Patrick Holt, is a 65 y.o. male, DOB - 01-03-1951, ZN:[id number]  Admit date - 11/14/2015   Admitting Physician Murlean Iba, MD  Outpatient Primary MD for the patient is [name], MD  LOS - 0   Chief Complaint  Patient presents with  . Altered Mental Status      Brief Narrative   65 y.o. male with history of A. fib, anxiety, bipolar, COPD, HTN, HLD, CAD, seizure disorder, recurrent falls, parkinsonism, presents with altered mental status, workup significant for hypernatremia and dehydration.   Subjective:    Axavier Partridge today has, No headache, No chest pain, No abdominal pain - No Nausea, No Cough - SOB. Feels much better today.   Assessment  & Plan :    Principal Problem:   Altered mental state Active Problems:   DEPRESSION, MAJOR   TOBACCO USER   ISCHEMIC HEART DISEASE   RAYNAUD'S DISEASE   COPD (chronic obstructive pulmonary disease) (HCC)   GERD   Atrial fibrillation (HCC)   Coronary atherosclerosis of native coronary artery   Severe dehydration   Hypernatremia   Dehydration - Improving, improved oral intake continue with IV fluids  Acute encephalopathy - Toxic secondary to dehydration and medication and hypernatremia - Follow on EEG - Improving  Hypernatremia - Secondary to dehydration and volume depletion, improving with D5W  Hypoglycemia - Secondary to poor oral intake, continue to monitor closely  Hyperbilirubinemia - Trending down, monitor closely  COPD - No active wheezing  GERD - Continue with PPIs  Seizures - Resume home antibiotics regimen  Depression and anxiety - Continue home medication, she appears to be on Ativan and Klonopin, will  discontinue Ativan and decrease clonidine dose  Atrial fibrillation - Heart rate controlled, continue  with amiodarone, not a candidate for anticoagulation secondary to multiple falls and history of subdural hematoma  HTN - Continue with home medication     Code Status : Full  Family Communication  : none at bedside  Disposition Plan  : Back to SNF in 1-2 days  Consults  :  none  Procedures  : none  DVT Prophylaxis  :   Heparin  Lab Results  Component Value Date   PLT 225 11/15/2015    Antibiotics  :    Anti-infectives    Start     Dose/Rate Route Frequency Ordered Stop   11/15/15 1400  vancomycin (VANCOCIN) 1,250 mg in sodium chloride 0.9 % 250 mL IVPB  Status:  Discontinued     1,250 mg 166.7 mL/hr over 90 Minutes Intravenous Every 24 hours 11/14/15 1304 11/14/15 1527   11/14/15 2200  ceFEPIme (MAXIPIME) 1 g in dextrose 5 % 50 mL IVPB  Status:  Discontinued     1 g 100 mL/hr over 30 Minutes Intravenous Every 8 hours 11/14/15 1303 11/14/15 1527   11/14/15 1245  ceFEPIme (MAXIPIME) 2 g in dextrose 5 % 50 mL IVPB     2 g 100 mL/hr over 30 Minutes Intravenous STAT 11/14/15 1238 11/14/15 1403   11/14/15 1245  vancomycin (VANCOCIN) 2,000 mg in sodium chloride 0.9 % 500 mL IVPB     2,000 mg 250 mL/hr over 120 Minutes Intravenous STAT 11/14/15 1238 11/14/15 1613        Objective:   Vitals:   11/14/15 1630 11/14/15 1721 11/14/15 2153 11/15/15 0415  BP: 121/85 129/79 (!) 124/95 132/79  Pulse:  84 69 65  Resp: 16 16 16 16   Temp:  97.8 F (36.6 C) 97.5 F (36.4 C) 98.5 F (36.9 C)  TempSrc:  Oral Oral Oral  SpO2:  100% 96% 98%  Weight:    91.6 kg (201 lb 15.1 oz)  Height:  5\' 3"  (1.6 m)      Wt Readings from Last 3 Encounters:  11/15/15 91.6 kg (201 lb 15.1 oz)  11/05/15 101.4 kg (223 lb 8 oz)  08/20/15 113.4 kg (250 lb)     Intake/Output Summary (Last 24 hours) at 11/15/15 1349 Last data filed at 11/15/15 1335  Gross per 24 hour  Intake           2061.25 ml  Output              500 ml  Net          1561.25 ml     Physical Exam  Awake Alert, Oriented X 3 [place].AT,PERRAL Supple Neck,No JVD, No cervical lymphadenopathy appriciated.  Symmetrical Chest wall movement, Good air movement bilaterally, CTAB RRR,No Gallops,Rubs or new Murmurs, No Parasternal Heave +ve B.Sounds, Abd Soft, No tenderness, No rebound - guarding or rigidity. No Cyanosis, Clubbing or edema, No new Rash or bruise      Data Review:    CBC  Recent Labs Lab 11/14/15 1154 11/15/15 0531  WBC 15.6* 11.2*  HGB 16.4 15.5  HCT 51.2 45.7  PLT 312 225  MCV 90.9 89.1  MCH 29.1 30.2  MCHC 32.0 33.9  RDW 17.1* 16.9*    Chemistries   Recent Labs Lab 11/14/15 1154 11/15/15 0008 11/15/15 0531  NA 153* 148* 149*  K 4.2 3.8 3.5  CL 117* 119* 117*  CO2 20* 25 27  GLUCOSE 62* 97 104*  BUN 45* 29* 26*  CREATININE 1.44* 0.93 0.83  CALCIUM 8.5* 7.7* 8.1*  AST 71*  --  63*  ALT 40  --  42  ALKPHOS 85  --  67  BILITOT 2.5*  --  1.9*   ------------------------------------------------------------------------------------------------------------------ No results for input(s): CHOL, HDL, LDLCALC, TRIG, CHOLHDL, LDLDIRECT in the last 72 hours.  Lab Results  Component Value Date  HGBA1C 5.8 (H) 12020/05/3014   ------------------------------------------------------------------------------------------------------------------ No results for input(s): TSH, T4TOTAL, T3FREE, THYROIDAB in the last 72 hours.  Invalid input(s): FREET3 ------------------------------------------------------------------------------------------------------------------ No results for input(s): VITAMINB12, FOLATE, FERRITIN, TIBC, IRON, RETICCTPCT in the last 72 hours.  Coagulation profile No results for input(s): INR, PROTIME in the last 168 hours.  No results for input(s): DDIMER in the last 72 hours.  Cardiac Enzymes No results for input(s): CKMB, TROPONINI, MYOGLOBIN in the last 168  hours.  Invalid input(s): CK ------------------------------------------------------------------------------------------------------------------    Component Value Date/Time   BNP 28.4 07/31/2014 1927    Inpatient Medications  Scheduled Meds: . amiodarone  100 mg Oral Daily  . amLODipine  5 mg Oral Daily  . aspirin EC  81 mg Oral Daily  . atorvastatin  40 mg Oral QHS  . divalproex  750 mg Oral Daily  . heparin  5,000 Units Subcutaneous Q8H  . levETIRAcetam  500 mg Oral BID  . pantoprazole  40 mg Oral Daily  . risperiDONE  1 mg Oral QHS  . sodium chloride flush  3 mL Intravenous Q12H   Continuous Infusions: . dextrose     PRN Meds:.acetaminophen **OR** acetaminophen, hydrALAZINE, ipratropium-albuterol, LORazepam, senna-docusate  Micro Results Recent Results (from the past 240 hour(s))  MRSA PCR Screening     Status: None   Collection Time: 11/14/15  5:15 PM  Result Value Ref Range Status   MRSA by PCR NEGATIVE NEGATIVE Final    Comment:        The GeneXpert MRSA Assay (FDA approved for NASAL specimens only), is one component of a comprehensive MRSA colonization surveillance program. It is not intended to diagnose MRSA infection nor to guide or monitor treatment for MRSA infections.     Radiology Reports Dg Chest 2 View  Result Date: 11/02/2015 CLINICAL DATA:  Cough. EXAM: CHEST  2 VIEW COMPARISON:  10/29/2015 chest radiograph. FINDINGS: Stable cardiomediastinal silhouette with mild cardiomegaly and aortic atherosclerosis. No pneumothorax. No pleural effusion. No pulmonary edema. Patchy opacity in both lower lobes, not appreciably changed. Stable eventration anterior right hemidiaphragm. IMPRESSION: 1. Stable mild cardiomegaly without overt pulmonary edema . 2. Stable mild patchy bilateral lower lobe opacity, nonspecific, favor atelectasis, cannot exclude a component of aspiration or pneumonia. Electronically Signed   By: Ilona Sorrel M.D.   On: 11/02/2015 16:49   Dg  Chest 2 View  Result Date: 10/29/2015 CLINICAL DATA:  Status post fall x3 today. EXAM: CHEST  2 VIEW COMPARISON:  PA and lateral chest 10/16/2012, 12/30/2012 07/31/2014 and 12/30/2014. CT chest 07/31/2014. FINDINGS: Asymmetric elevation of the right hemidiaphragm is seen on prior exams. The lungs are clear. Heart size is upper normal. No pneumothorax or pleural effusion. Aortic atherosclerosis noted. IMPRESSION: No acute disease. Electronically Signed   By: Inge Rise M.D.   On: 10/29/2015 14:56   Dg Ribs Unilateral W/chest Right  Result Date: 11/02/2015 CLINICAL DATA:  Right rib pain after fall. EXAM: RIGHT RIBS AND CHEST - 3+ VIEW COMPARISON:  Chest radiograph from earlier today. FINDINGS: There are acute mildly displaced fractures of the anterior right sixth and seventh ribs. No additional fracture. No suspicious focal osseous lesion. Stable eventration of the anterior right hemidiaphragm with patchy bibasilar lung opacities. No overt pneumothorax on these three views. IMPRESSION: 1. Acute mildly displaced anterior right sixth and seventh rib fractures. No overt pneumothorax on these rib views . 2. Stable eventration of the anterior right hemidiaphragm with patchy nonspecific bibasilar lung opacities, favor atelectasis, cannot exclude a component  of aspiration or pneumonia. Electronically Signed   By: Ilona Sorrel M.D.   On: 11/02/2015 16:54   Dg Wrist Complete Right  Result Date: 10/29/2015 CLINICAL DATA:  65 year old male status post multiple falls at home. Pain. Initial encounter. EXAM: RIGHT WRIST - COMPLETE 3+ VIEW COMPARISON:  None. FINDINGS: Comminuted and dorsally impacted fracture of the distal right radius with radiocarpal joint and DRU involvement. Comminuted mildly displaced fracture of the ulnar styloid. Surrounding soft tissue swelling. Mild widening of the scapholunate interval, but otherwise carpal bone alignment appears preserved. Visible metacarpals appear intact, there is  foreshortening of the fifth metacarpal. Calcified peripheral vascular disease. IMPRESSION: 1. Comminuted and dorsally impacted distal right radius fracture with radiocarpal and probably also DRU involvement. 2. Comminuted mildly displaced fracture of the right ulnar styloid. 3. Widening of the scapholunate interval suspicious for scapholunate ligament injury. Electronically Signed   By: Genevie Ann M.D.   On: 10/29/2015 14:56   Ct Head Wo Contrast  Result Date: 11/14/2015 CLINICAL DATA:  Auditory hallucinations, flight of ideas and agitation. EXAM: CT HEAD WITHOUT CONTRAST TECHNIQUE: Contiguous axial images were obtained from the base of the skull through the vertex without intravenous contrast. COMPARISON:  10/29/2015 CT FINDINGS: Brain: No evidence of acute large vessel territory infarction, hemorrhage, hydrocephalus, extra-axial collection or mass lesion/mass effect. Superficial and central atrophy with chronic appearing small vessel ischemic disease of periventricular and subcortical white matter. Vascular: No hyperdense vessel or unexpected calcification. Skull: Negative Sinuses/Orbits: Left ocular lens replacement. The visualized paranasal sinuses are clear. Other: Clear mastoids bilaterally. IMPRESSION: Cerebral atrophy with chronic small vessel ischemic disease. No significant change from recent comparison. Electronically Signed   By: [name] Royalty M.D.   On: 11/14/2015 13:28   Ct Head Wo Contrast  Result Date: 10/29/2015 CLINICAL DATA:  Status post fall x3 at home today. The patient reports a blow to the head. Initial encounter. EXAM: CT HEAD WITHOUT CONTRAST CT CERVICAL SPINE WITHOUT CONTRAST TECHNIQUE: Multidetector CT imaging of the head and cervical spine was performed following the standard protocol without intravenous contrast. Multiplanar CT image reconstructions of the cervical spine were also generated. COMPARISON:  Head and cervical spine CT scan 06/26/2014. FINDINGS: CT HEAD FINDINGS Brain:  Cortical atrophy and chronic microvascular ischemic change are identified. No evidence of acute abnormality including hemorrhage, infarct, mass lesion, mass effect, midline shift or abnormal extra-axial fluid collection is identified. No hydrocephalus or pneumocephalus. Vascular: Extensive atherosclerosis is seen. Skull: Intact. Sinuses/Orbits: Unremarkable. Other: None. CT CERVICAL SPINE FINDINGS Alignment: Maintained.  Straightening of lordosis is unchanged. Skull base and vertebrae: Unremarkable. Soft tissues and spinal canal: Unremarkable. Disc levels: Intervertebral disc space height is maintained. Degenerative change at the C1-2 articulation is noted. Upper chest: Emphysematous change in the apices is noted. Other: None. IMPRESSION: No acute abnormality head or cervical spine. Atherosclerosis. Atrophy and chronic microvascular ischemic change. Emphysema. Electronically Signed   By: Inge Rise M.D.   On: 10/29/2015 14:29   Ct Cervical Spine Wo Contrast  Result Date: 10/29/2015 CLINICAL DATA:  Status post fall x3 at home today. The patient reports a blow to the head. Initial encounter. EXAM: CT HEAD WITHOUT CONTRAST CT CERVICAL SPINE WITHOUT CONTRAST TECHNIQUE: Multidetector CT imaging of the head and cervical spine was performed following the standard protocol without intravenous contrast. Multiplanar CT image reconstructions of the cervical spine were also generated. COMPARISON:  Head and cervical spine CT scan 06/26/2014. FINDINGS: CT HEAD FINDINGS Brain: Cortical atrophy and chronic microvascular ischemic change are  identified. No evidence of acute abnormality including hemorrhage, infarct, mass lesion, mass effect, midline shift or abnormal extra-axial fluid collection is identified. No hydrocephalus or pneumocephalus. Vascular: Extensive atherosclerosis is seen. Skull: Intact. Sinuses/Orbits: Unremarkable. Other: None. CT CERVICAL SPINE FINDINGS Alignment: Maintained.  Straightening of lordosis  is unchanged. Skull base and vertebrae: Unremarkable. Soft tissues and spinal canal: Unremarkable. Disc levels: Intervertebral disc space height is maintained. Degenerative change at the C1-2 articulation is noted. Upper chest: Emphysematous change in the apices is noted. Other: None. IMPRESSION: No acute abnormality head or cervical spine. Atherosclerosis. Atrophy and chronic microvascular ischemic change. Emphysema. Electronically Signed   By: Inge Rise M.D.   On: 10/29/2015 14:29   Ct Wrist Right Wo Contrast  Result Date: 11/04/2015 CLINICAL DATA:  Muscular dystrophy. Status post fall. Right wrist pain. EXAM: CT OF THE RIGHT WRIST WITHOUT CONTRAST TECHNIQUE: Multidetector CT imaging of the right wrist was performed according to the standard protocol. Multiplanar CT image reconstructions were also generated. COMPARISON:  None. FINDINGS: Bones/Joint/Cartilage Comminuted fracture of the distal radial metaphysis extending to the epiphysis. 8 mm of distraction of the distal radial articular surface. 10 mm of dorsal displacement of the distal dorsal major fracture fragment. Minimally displaced fracture of the ulnar styloid process. Widening of the scapholunate joint most concerning for scapholunate ligament tear. Generalized osteopenia consistent with disuse. No other fracture or dislocation. 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 Ligaments Suboptimally assessed by CT. Muscles and Tendons Suboptimally evaluated by CT. Soft tissues No fluid collection or hematoma. Generalized soft tissue swelling around the wrist. IMPRESSION: 1. Comminuted fracture of the distal radial metaphysis extending to the epiphysis. 8 mm of distraction of the distal radial articular surface. 10 mm of dorsal displacement of the distal dorsal major fracture fragment. 2. Minimally displaced fracture of the ulnar styloid process. 3. Widening of the scapholunate joint most concerning for scapholunate ligament tear.  Electronically Signed   By: Kathreen Devoid   On: 11/04/2015 11:27   Dg Chest Port 1 View  Result Date: 11/14/2015 CLINICAL DATA:  Altered mental status. History of hypertension and ischemic heart disease. EXAM: PORTABLE CHEST 1 VIEW COMPARISON:  Chest x-ray 11/02/2015 FINDINGS: The heart is enlarged but stable. Mild tortuosity and calcification of the thoracic aorta is stable. Stable eventration of the right hemidiaphragm with overlying vascular crowding and atelectasis. No infiltrates, edema or effusions. IMPRESSION: Stable cardiac enlargement and elevation of the right hemidiaphragm. No infiltrates or effusions. Electronically Signed   By: Marijo Sanes M.D.   On: 11/14/2015 13:01   Dg Hand Complete Right  Result Date: 10/29/2015 CLINICAL DATA:  65 year old male status post multiple falls at home. Pain. Initial encounter. EXAM: RIGHT HAND - COMPLETE 3+ VIEW COMPARISON:  Right wrist series from today reported separately. FINDINGS: See right wrist series from today reported separately regarding distal radius, ulna, and carpal bone injuries. Foreshortening of the right fifth metacarpal where there may be an underlying healed chronic fracture. No acute metacarpal fracture. No acute phalanx fracture. Distal joint spaces are within normal limits for age. IMPRESSION: 1. See right wrist series from today reported separately. 2. No superimposed acute fracture or dislocation identified in the right hand. Electronically Signed   By: Genevie Ann M.D.   On: 10/29/2015 14:57     Zeyna Mkrtchyan M.D on 11/15/2015 at 1:49 PM  Between 7am to 7pm - Pager - [phone]  After 7pm go to [email or web address] - password [facility]  [facility] -  Office  [phone]

## 2015-11-15 NOTE — Care Management Obs Status (Addendum)
[place] NOTIFICATION   Patient Details  Name: Patrick Holt MRN: [id number] Date of Birth: [birth year]   Medicare Observation Status Notification Given:  Yes  Delivered to pt's room, unable to understand. Contacted emergency contact, no answer/no voicemail.   Erenest Rasher, RN 11/15/2015, 6:37 PM

## 2015-11-15 NOTE — NC FL2 (Signed)
[place] LEVEL OF CARE SCREENING TOOL     IDENTIFICATION  Patient Name: Patrick Holt Birthdate: 05-11-1950 Sex: male Admission Date (Current Location): 11/14/2015  [facility] and [place] Number:  Herbalist and Address:  [facility],  [place] [address] [place]      Provider Number: [id number]  Attending Physician Name and Address:  Albertine Patricia, MD  Relative Name and Phone Number:       Current Level of Care: Hospital Recommended Level of Care: [place] Prior Approval Number:    Date Approved/Denied:   PASRR Number:    Discharge Plan: SNF    Current Diagnoses: Patient Active Problem List   Diagnosis Date Noted  . Altered mental state 11/14/2015  . Severe dehydration 11/14/2015  . Hypernatremia 11/14/2015  . Difficulty in walking, not elsewhere classified   . Right wrist fracture   . Closed displaced fracture of ulnar styloid with routine healing   . Scapholunate ligament injury with no instability   . Falls frequently   . Hypoxia 11/02/2015  . Anxiety 06/06/2015  . Impaired mobility 04/28/2015  . Lumbar spondylosis with myelopathy 03/07/2015  . Nocturia 12/03/2014  . Chronic venous insufficiency 09/23/2014  . Poor social situation 07/30/2014  . Schizophrenia, acute ([place])   . Schizoaffective disorder, unspecified type ([place])   . Decreased visual acuity 06/27/2014  . COPD exacerbation ([place])   . Right sided weakness 01/04/2014  . Essential hypertension, benign   . Atrial fibrillation, unspecified   . Radiculopathy of cervical region   . Polyuria 12/07/2013  . Erectile dysfunction 12/07/2013  . Diverticulitis 08/01/2013  . Acute diverticulitis 08/01/2013  . Hematochezia 06/04/2013  . Hypokalemia 10/03/2012  . Benign neoplasm of colon 09/27/2012  . Stricture and stenosis of esophagus 09/27/2012  . Diverticulitis of colon without hemorrhage 07/26/2012  . Polypharmacy 03/26/2012  . Seizures  ([place]) 02/10/2012  . Atrial fibrillation ([place]) 02/05/2012  . Coronary atherosclerosis of native coronary artery 02/05/2012  . Radicular low back pain 03/09/2011  . History of seizure disorder 01/29/2011  . Irritable bowel syndrome (IBS) 06/24/2010  . Urge incontinence 06/19/2010  . TOBACCO USER 11/23/2008  . DEPRESSION, MAJOR 05/31/2008  . ISCHEMIC HEART DISEASE 05/31/2008  . RAYNAUD'S DISEASE 05/31/2008  . HIATAL HERNIA 05/31/2008  . Rheumatoid arthritis ([place]) 05/31/2008  . FIBROMYALGIA 05/31/2008  . Hyperlipidemia 01/26/2008  . Anxiety state 01/26/2008  . HEMORRHOIDS, INTERNAL 01/26/2008  . COPD (chronic obstructive pulmonary disease) ([place]) 01/26/2008  . ESOPHAGEAL MOTILITY DISORDER 01/26/2008  . GERD 01/26/2008  . Dermatomyositis ([place]) 01/26/2008    Orientation RESPIRATION BLADDER Height & Weight     Self  Normal Incontinent Weight: 201 lb 15.1 oz (91.6 kg) Height:  5\' 3"  (160 cm)  BEHAVIORAL SYMPTOMS/MOOD NEUROLOGICAL BOWEL NUTRITION STATUS  Other (Comment) (no behaviors) Convulsions/Seizures Continent Diet  AMBULATORY STATUS COMMUNICATION OF NEEDS Skin   Limited Assist Verbally Skin abrasions, Bruising                       Personal Care Assistance Level of Assistance  Bathing, Feeding, Dressing Bathing Assistance: Maximum assistance Feeding assistance: Independent Dressing Assistance: Maximum assistance     Functional Limitations Info  Sight, Hearing, Speech Sight Info: Adequate Hearing Info: Adequate Speech Info: Adequate    SPECIAL CARE FACTORS FREQUENCY  PT (By licensed PT), OT (By licensed OT)     PT Frequency: 5x wk OT Frequency: 5x wk  Contractures Contractures Info: Not present    Additional Factors Info  Code Status Code Status Info: Full Code             Current Medications (11/15/2015):  This is the current hospital active medication list Current Facility-Administered Medications  Medication Dose Route Frequency  Provider Last Rate Last Dose  . acetaminophen (TYLENOL) tablet 650 mg  650 mg Oral Q6H PRN Clanford Marisa Hua, MD       Or  . acetaminophen (TYLENOL) suppository 650 mg  650 mg Rectal Q6H PRN Clanford Marisa Hua, MD      . amiodarone (PACERONE) tablet 100 mg  100 mg Oral Daily Clanford Marisa Hua, MD   100 mg at 11/15/15 0901  . amLODipine (NORVASC) tablet 5 mg  5 mg Oral Daily Clanford Marisa Hua, MD   5 mg at 11/15/15 0901  . aspirin EC tablet 81 mg  81 mg Oral Daily Clanford Marisa Hua, MD   81 mg at 11/15/15 0901  . atorvastatin (LIPITOR) tablet 40 mg  40 mg Oral QHS Clanford L Johnson, MD      . clonazePAM (KLONOPIN) tablet 0.5 mg  0.5 mg Oral Daily Dawood S Elgergawy, MD      . dextrose 5 % solution   Intravenous Continuous Albertine Patricia, MD 75 mL/hr at 11/15/15 1401    . divalproex (DEPAKOTE ER) 24 hr tablet 750 mg  750 mg Oral Daily Clanford L Johnson, MD   750 mg at 11/15/15 0901  . heparin injection 5,000 Units  5,000 Units Subcutaneous Q8H Clanford Marisa Hua, MD   5,000 Units at 11/15/15 1401  . hydrALAZINE (APRESOLINE) injection 10 mg  10 mg Intravenous Q4H PRN Clanford L Johnson, MD      . ipratropium-albuterol (DUONEB) 0.5-2.5 (3) MG/3ML nebulizer solution 3 mL  3 mL Inhalation Q6H PRN Clanford Marisa Hua, MD      . levETIRAcetam (KEPPRA) tablet 500 mg  500 mg Oral BID Clanford Marisa Hua, MD   500 mg at 11/15/15 0901  . LORazepam (ATIVAN) tablet 0.5 mg  0.5 mg Oral Q6H PRN Clanford Marisa Hua, MD   0.5 mg at 11/14/15 2124  . pantoprazole (PROTONIX) EC tablet 40 mg  40 mg Oral Daily Clanford Marisa Hua, MD   40 mg at 11/15/15 0901  . risperiDONE (RISPERDAL) tablet 1 mg  1 mg Oral QHS Clanford L Johnson, MD      . senna-docusate (Senokot-S) tablet 1 tablet  1 tablet Oral QHS PRN Clanford L Johnson, MD      . sodium chloride flush (NS) 0.9 % injection 3 mL  3 mL Intravenous Q12H Clanford Marisa Hua, MD         Discharge Medications: Please see discharge summary for a list of discharge  medications.  Relevant Imaging Results:  Relevant Lab Results:   Additional Information SS#: [id number]  Odis Wickey, Randall An, LCSW

## 2015-11-16 ENCOUNTER — Observation Stay (HOSPITAL_COMMUNITY): Payer: Medicare Other

## 2015-11-16 DIAGNOSIS — E870 Hyperosmolality and hypernatremia: Secondary | ICD-10-CM | POA: Diagnosis present

## 2015-11-16 DIAGNOSIS — J449 Chronic obstructive pulmonary disease, unspecified: Secondary | ICD-10-CM | POA: Diagnosis not present

## 2015-11-16 DIAGNOSIS — Z833 Family history of diabetes mellitus: Secondary | ICD-10-CM | POA: Diagnosis not present

## 2015-11-16 DIAGNOSIS — E7220 Disorder of urea cycle metabolism, unspecified: Secondary | ICD-10-CM | POA: Diagnosis not present

## 2015-11-16 DIAGNOSIS — N179 Acute kidney failure, unspecified: Secondary | ICD-10-CM | POA: Diagnosis present

## 2015-11-16 DIAGNOSIS — Z79899 Other long term (current) drug therapy: Secondary | ICD-10-CM | POA: Diagnosis not present

## 2015-11-16 DIAGNOSIS — F419 Anxiety disorder, unspecified: Secondary | ICD-10-CM | POA: Diagnosis present

## 2015-11-16 DIAGNOSIS — E538 Deficiency of other specified B group vitamins: Secondary | ICD-10-CM | POA: Diagnosis present

## 2015-11-16 DIAGNOSIS — Z8249 Family history of ischemic heart disease and other diseases of the circulatory system: Secondary | ICD-10-CM | POA: Diagnosis not present

## 2015-11-16 DIAGNOSIS — G92 Toxic encephalopathy: Secondary | ICD-10-CM | POA: Diagnosis present

## 2015-11-16 DIAGNOSIS — I2510 Atherosclerotic heart disease of native coronary artery without angina pectoris: Secondary | ICD-10-CM | POA: Diagnosis present

## 2015-11-16 DIAGNOSIS — M79601 Pain in right arm: Secondary | ICD-10-CM | POA: Diagnosis not present

## 2015-11-16 DIAGNOSIS — I482 Chronic atrial fibrillation: Secondary | ICD-10-CM | POA: Diagnosis not present

## 2015-11-16 DIAGNOSIS — T424X5A Adverse effect of benzodiazepines, initial encounter: Secondary | ICD-10-CM | POA: Diagnosis present

## 2015-11-16 DIAGNOSIS — W19XXXA Unspecified fall, initial encounter: Secondary | ICD-10-CM | POA: Diagnosis not present

## 2015-11-16 DIAGNOSIS — Z9181 History of falling: Secondary | ICD-10-CM | POA: Diagnosis not present

## 2015-11-16 DIAGNOSIS — R4182 Altered mental status, unspecified: Secondary | ICD-10-CM | POA: Diagnosis present

## 2015-11-16 DIAGNOSIS — I7300 Raynaud's syndrome without gangrene: Secondary | ICD-10-CM | POA: Diagnosis present

## 2015-11-16 DIAGNOSIS — G20 Parkinson's disease: Secondary | ICD-10-CM | POA: Diagnosis present

## 2015-11-16 DIAGNOSIS — K219 Gastro-esophageal reflux disease without esophagitis: Secondary | ICD-10-CM | POA: Diagnosis not present

## 2015-11-16 DIAGNOSIS — R17 Unspecified jaundice: Secondary | ICD-10-CM | POA: Diagnosis present

## 2015-11-16 DIAGNOSIS — Z87891 Personal history of nicotine dependence: Secondary | ICD-10-CM | POA: Diagnosis not present

## 2015-11-16 DIAGNOSIS — E785 Hyperlipidemia, unspecified: Secondary | ICD-10-CM | POA: Diagnosis not present

## 2015-11-16 DIAGNOSIS — I10 Essential (primary) hypertension: Secondary | ICD-10-CM | POA: Diagnosis not present

## 2015-11-16 DIAGNOSIS — H8110 Benign paroxysmal vertigo, unspecified ear: Secondary | ICD-10-CM | POA: Diagnosis not present

## 2015-11-16 DIAGNOSIS — G40909 Epilepsy, unspecified, not intractable, without status epilepticus: Secondary | ICD-10-CM | POA: Diagnosis present

## 2015-11-16 DIAGNOSIS — E860 Dehydration: Secondary | ICD-10-CM | POA: Diagnosis present

## 2015-11-16 DIAGNOSIS — F319 Bipolar disorder, unspecified: Secondary | ICD-10-CM | POA: Diagnosis present

## 2015-11-16 DIAGNOSIS — F259 Schizoaffective disorder, unspecified: Secondary | ICD-10-CM | POA: Diagnosis present

## 2015-11-16 DIAGNOSIS — E11649 Type 2 diabetes mellitus with hypoglycemia without coma: Secondary | ICD-10-CM | POA: Diagnosis present

## 2015-11-16 DIAGNOSIS — Z4789 Encounter for other orthopedic aftercare: Secondary | ICD-10-CM | POA: Diagnosis not present

## 2015-11-16 DIAGNOSIS — S59291A Other physeal fracture of lower end of radius, right arm, initial encounter for closed fracture: Secondary | ICD-10-CM | POA: Diagnosis not present

## 2015-11-16 DIAGNOSIS — S52501A Unspecified fracture of the lower end of right radius, initial encounter for closed fracture: Secondary | ICD-10-CM | POA: Diagnosis present

## 2015-11-16 DIAGNOSIS — Z811 Family history of alcohol abuse and dependence: Secondary | ICD-10-CM | POA: Diagnosis not present

## 2015-11-16 DIAGNOSIS — N401 Enlarged prostate with lower urinary tract symptoms: Secondary | ICD-10-CM | POA: Diagnosis not present

## 2015-11-16 DIAGNOSIS — M6281 Muscle weakness (generalized): Secondary | ICD-10-CM | POA: Diagnosis not present

## 2015-11-16 LAB — HEPATIC FUNCTION PANEL
ALBUMIN: 2.8 g/dL — AB (ref 3.5–5.0)
ALT: 42 U/L (ref 17–63)
AST: 43 U/L — AB (ref 15–41)
Alkaline Phosphatase: 60 U/L (ref 38–126)
Bilirubin, Direct: 0.3 mg/dL (ref 0.1–0.5)
Indirect Bilirubin: 1.3 mg/dL — ABNORMAL HIGH (ref 0.3–0.9)
TOTAL PROTEIN: 5.2 g/dL — AB (ref 6.5–8.1)
Total Bilirubin: 1.6 mg/dL — ABNORMAL HIGH (ref 0.3–1.2)

## 2015-11-16 LAB — GLUCOSE, CAPILLARY
GLUCOSE-CAPILLARY: 141 mg/dL — AB (ref 65–99)
Glucose-Capillary: 104 mg/dL — ABNORMAL HIGH (ref 65–99)
Glucose-Capillary: 139 mg/dL — ABNORMAL HIGH (ref 65–99)
Glucose-Capillary: 128 mg/dL — ABNORMAL HIGH (ref 65–99)

## 2015-11-16 LAB — MAGNESIUM: MAGNESIUM: 2 mg/dL (ref 1.7–2.4)

## 2015-11-16 LAB — BASIC METABOLIC PANEL
ANION GAP: 5 (ref 5–15)
BUN: 19 mg/dL (ref 6–20)
CALCIUM: 8 mg/dL — AB (ref 8.9–10.3)
CO2: 27 mmol/L (ref 22–32)
Chloride: 109 mmol/L (ref 101–111)
Creatinine, Ser: 0.82 mg/dL (ref 0.61–1.24)
Glucose, Bld: 116 mg/dL — ABNORMAL HIGH (ref 65–99)
Potassium: 3.2 mmol/L — ABNORMAL LOW (ref 3.5–5.1)
Sodium: 141 mmol/L (ref 135–145)

## 2015-11-16 LAB — PHOSPHORUS: PHOSPHORUS: 2.6 mg/dL (ref 2.5–4.6)

## 2015-11-16 MED ORDER — POTASSIUM CHLORIDE CRYS ER 20 MEQ PO TBCR
40.0000 meq | EXTENDED_RELEASE_TABLET | Freq: Once | ORAL | Status: AC
  Administered 2015-11-16: 40 meq via ORAL
  Filled 2015-11-16: qty 2

## 2015-11-16 MED ORDER — KCL IN DEXTROSE-NACL 20-5-0.45 MEQ/L-%-% IV SOLN
INTRAVENOUS | Status: DC
  Administered 2015-11-16 – 2015-11-17 (×3): via INTRAVENOUS
  Filled 2015-11-16 (×3): qty 1000

## 2015-11-16 MED ORDER — K PHOS MONO-SOD PHOS DI & MONO 155-852-130 MG PO TABS
500.0000 mg | ORAL_TABLET | Freq: Two times a day (BID) | ORAL | Status: AC
  Administered 2015-11-16 (×2): 500 mg via ORAL
  Filled 2015-11-16 (×2): qty 2

## 2015-11-16 NOTE — Evaluation (Addendum)
Physical Therapy Evaluation Patient Details Name: Patrick Holt MRN: [id number] DOB: 1950-07-18 Today's Date: 11/16/2015   History of Present Illness  Patrick Holt is a 65 y.o. male with history of A. fib, anxiety, bipolar, COPD, HTN, HLD, CAD, seizure disorder, recurrent falls, parkinsonism, recently admitted from [id number] for hypoxia, cough, shortness of breath was found to have rib fractures  and right wrist fx from a fall on September 13,  and was discharged to SNF.  The [facility] skilled nursing facility sent him to the ER 9/29  for evaluation of 3-4 days worsening altered mental status. Per previous admission notes, patient had a splint on the Right wrist/arm.  Clinical Impression  The patient is confused, tangential thoughts and not sensible. The patient comes from Skilled care and recommend to return at DC. Pt admitted with above diagnosis. Pt currently with functional limitations due to the deficits listed below (see PT Problem List).  Pt will benefit from skilled PT to increase their independence and safety with mobility to allow discharge to the venue listed below.   RECOMMEND OT CONSULT AND A SPLINT REPLACED ON THE RIGHT WRIST BY ORTHO TECHNICIAN.([id number])     Follow Up Recommendations SNF;Supervision/Assistance - 24 hour    Equipment Recommendations  None recommended by PT    Recommendations for Other Services   OT consult    Precautions / Restrictions Precautions Precautions: Fall Precaution Comments:  R wrist fx, R 6th & 7th rib fxs Other Brace/Splint: splint on R UE- not with patient. Previous admission notes indicate a forearm sugartong  spint was placed by ortho tech. Per notes, patient was to f/u with Dr. Burney Gauze. Restrictions RUE Weight Bearing: Non weight bearing Other Position/Activity Restrictions: per MD note, pt NWB      Mobility  Bed Mobility Overal bed mobility: Needs Assistance Bed Mobility: Supine to Sit     Supine to sit: Mod  assist;+2 for safety/equipment;HOB elevated     General bed mobility comments: multimodal cues and extra time to  get trunk to upright, assist with  trunk with mod assist  Transfers Overall transfer level: Needs assistance Equipment used: 2 person hand held assist Transfers: Stand Pivot Transfers;Sit to/from Stand Sit to Stand: Mod assist;+2 safety/equipment;From elevated surface Stand pivot transfers: Mod assist;+2 safety/equipment       General transfer comment: wide base of support, shuffle steps.  Ambulation/Gait Ambulation/Gait assistance: Mod assist;+2 physical assistance Ambulation Distance (Feet): 5 Feet Assistive device: 2 person hand held assist Gait Pattern/deviations: Shuffle;Wide base of support     General Gait Details: patient unsteady with 2 person assist. multimodal cues to stay on task. constantly talking and nonsensible  Stairs            Wheelchair Mobility    Modified Rankin (Stroke Patients Only)       Balance Overall balance assessment: History of Falls;Needs assistance Sitting-balance support: Single extremity supported;Feet supported Sitting balance-Leahy Scale: Fair     Standing balance support: During functional activity;Bilateral upper extremity supported Standing balance-Leahy Scale: Poor                               Pertinent Vitals/Pain Faces Pain Scale: Hurts little more Pain Location: back and r wrist Pain Intervention(s): Limited activity within patient's tolerance;Monitored during session    [place] expects to be discharged to:: Skilled nursing facility  Home Equipment: Financial trader - single point Additional Comments: was at [facility] snf    Prior Function                 Hand Dominance        Extremity/Trunk Assessment   Upper Extremity Assessment: RUE deficits/detail;Defer to OT evaluation RUE Deficits / Details: noted edema of  hand and wrist. noted  bruising         Lower Extremity Assessment: Generalized weakness         Communication      Cognition Arousal/Alertness: Awake/alert   Overall Cognitive Status: No family/caregiver present to determine baseline cognitive functioning Area of Impairment: Safety/judgement;Problem solving;Memory;Orientation     Memory: Decreased recall of precautions;Decreased short-term memory   Safety/Judgement: Decreased awareness of deficits;Decreased awareness of safety   Problem Solving: Slow processing;Decreased initiation;Difficulty sequencing;Requires verbal cues;Requires tactile cues General Comments: patient making nonsensible comments,    General Comments      Exercises     Assessment/Plan    PT Assessment Patient needs continued PT services  PT Problem List Decreased strength;Decreased activity tolerance;Decreased balance;Decreased mobility;Decreased safety awareness          PT Treatment Interventions DME instruction;Gait training;Functional mobility training;Balance training;Therapeutic exercise;Therapeutic activities    PT Goals (Current goals can be found in the Care Plan section)  Acute Rehab PT Goals PT Goal Formulation: Patient unable to participate in goal setting Time For Goal Achievement: 11/30/15 Potential to Achieve Goals: Fair    Frequency Min 3X/week   Barriers to discharge        Co-evaluation               End of Session Equipment Utilized During Treatment: Gait belt Activity Tolerance: Patient tolerated treatment well Patient left: in chair;with call bell/phone within reach;with chair alarm set Nurse Communication: Mobility status    Functional Assessment Tool Used: clinical judgement and objective findings of bed mobility Functional Limitation: Changing and maintaining body position Changing and Maintaining Body Position Current Status [id number]): At least 60 percent but less than 80 percent impaired, limited or restricted Changing and  Maintaining Body Position Goal Status [id number]): At least 1 percent but less than 20 percent impaired, limited or restricted    Time: [id number] PT Time Calculation (min) (ACUTE ONLY): 18 min   Charges:   PT Evaluation $PT Eval moderate Complexity: 1 Procedure     PT G Codes:   PT G-Codes **NOT FOR INPATIENT CLASS** Functional Assessment Tool Used: clinical judgement and objective findings of bed mobility Functional Limitation: Changing and maintaining body position Changing and Maintaining Body Position Current Status [id number]): At least 60 percent but less than 80 percent impaired, limited or restricted Changing and Maintaining Body Position Goal Status [id number]): At least 1 percent but less than 20 percent impaired, limited or restricted    Marcelino Freestone PT [id number]  11/16/2015, 10:33 AM

## 2015-11-16 NOTE — Progress Notes (Signed)
Initial Nutrition Assessment  DOCUMENTATION CODES:   Obesity unspecified  INTERVENTION:   Recommend chopped meats with well cooked vegetables and soft foods in addition to Heart Healthy diet. RD to continue to monitor for needs  NUTRITION DIAGNOSIS:   Inadequate oral intake related to other (see comment) (pt endentulous) as evidenced by per patient/family report.  GOAL:   Patient will meet greater than or equal to 90% of their needs  MONITOR:   PO intake, Labs, Weight trends, I & O's  REASON FOR ASSESSMENT:   Consult Assessment of nutrition requirement/status  ASSESSMENT:   65 y.o. male with history of A. fib, anxiety, bipolar, COPD, HTN, HLD, CAD, seizure disorder, recurrent falls, parkinsonism, presents with altered mental status, workup significant for hypernatremia and dehydration.  Patient in room with no family at bedside. Pt presents with some confusion, able to answer questions when redirected but pt continued to provide tangential thoughts. Pt reports losing his dentures and is edentulous. He states he received an apple on his tray and is unable to eat it. PT would appreciate softer fruits and cooked vegetables on his tray. He is unable to chew meats, recommend chopped. Noticed on tray, pt consumed ice cream and a drink. Pt reports also having difficulty opening containers on his tray d/t a wrist injury.   Per weight history, pt has lost 22 lb since 9/20 (10% wt loss x 10 days, significant for time frame). Nutrition focused physical exam shows no sign of depletion of muscle mass or body fat.  Medications: K-Phos Neutral tablet BID, D5 and .45% NaCl w/ KCl infusion at 75 ml/hr -provides 306 kcal  Labs reviewed: CBGs: 104-141 Low K Mg/Phos WNL  Diet Order:  Diet Heart Room service appropriate? Yes with Assist; Fluid consistency: Thin  Skin:  Reviewed, no issues  Last BM:  9/30  Height:   Ht Readings from Last 1 Encounters:  11/14/15 5\' 3"  (1.6 m)     Weight:   Wt Readings from Last 1 Encounters:  11/15/15 201 lb 15.1 oz (91.6 kg)    Ideal Body Weight:  52.3 kg  BMI:  Body mass index is 35.77 kg/m.  Estimated Nutritional Needs:   Kcal:  1550-1750  Protein:  60-70g  Fluid:  1.7L/day  EDUCATION NEEDS:   No education needs identified at this time  Clayton Bibles, MS, RD, LDN Pager: [phone] After Hours Pager: [phone]

## 2015-11-16 NOTE — Progress Notes (Signed)
PROGRESS NOTE                                                                                                                                                                                                             Patient Demographics:    Patrick Holt, is a 65 y.o. male, DOB - 1950-10-07, [id number]  Admit date - 11/14/2015   Admitting Physician Murlean Iba, MD  Outpatient Primary MD for the patient is Patrick Riding, MD  LOS - 0   Chief Complaint  Patient presents with  . Altered Mental Status      Brief Narrative   65 y.o. male with history of A. fib, anxiety, bipolar, COPD, HTN, HLD, CAD, seizure disorder, recurrent falls, parkinsonism, presents with altered mental status, workup significant for hypernatremia and dehydration.   Subjective:    [name] today has, No headache, No chest pain, No abdominal pain - No Nausea, No Cough - SOB. Feels much better today.   Assessment  & Plan :    Principal Problem:   Altered mental state Active Problems:   DEPRESSION, MAJOR   TOBACCO USER   ISCHEMIC HEART DISEASE   RAYNAUD'S DISEASE   COPD (chronic obstructive pulmonary disease) (HCC)   GERD   Atrial fibrillation (HCC)   Coronary atherosclerosis of native coronary artery   Severe dehydration   Hypernatremia   Acute encephalopathy - Toxic secondary to dehydration and medication and hypernatremia - CT head is no acute finding, negative urinalysis - Follow on EEG  Dehydration - Improving, improved oral intake - continue with IV fluids  Hypernatremia - Secondary to dehydration and volume depletion, Resolved , sodium is 141 today, will transition to D5 half-normal saline .  Hypoglycemia - Secondary to poor oral intake, continue to monitor closely  Hyperbilirubinemia - Trending down, most likely indirect bilirubin,  most likely Gilbert  COPD - No active wheezing, and tinea with nebs as needed  GERD - Continue  with PPIs  Seizures - Resume home  regimen  Depression and anxiety - Continue home medication, he appears to be on Ativan and Klonopin, will discontinue Ativan and genu with Klonopin at much lower dose to prevent withdrawals   Atrial fibrillation - Heart rate controlled, continue  with amiodarone, not a candidate for anticoagulation secondary to multiple falls and history of subdural hematoma  HTN - Continue with home medication  Recent right wrist fracture - supposed to follow with hand surgeon upon discharge instructions from ED on 9/13, contacted SNF, patient had no splint, only Ace wrap at facility.    Code Status : Full  Family Communication  : none at bedside, chart to contact only available number in the record which is a friend , no one is answering, unable to leave a voicemail .  Disposition Plan  : Back to SNF in 1-2 days  Consults  :  none  Procedures  : none  DVT Prophylaxis  :   Heparin  Lab Results  Component Value Date   PLT 225 11/15/2015    Antibiotics  :    Anti-infectives    Start     Dose/Rate Route Frequency Ordered Stop   11/15/15 1400  vancomycin (VANCOCIN) 1,250 mg in sodium chloride 0.9 % 250 mL IVPB  Status:  Discontinued     1,250 mg 166.7 mL/hr over 90 Minutes Intravenous Every 24 hours 11/14/15 1304 11/14/15 1527   11/14/15 2200  ceFEPIme (MAXIPIME) 1 g in dextrose 5 % 50 mL IVPB  Status:  Discontinued     1 g 100 mL/hr over 30 Minutes Intravenous Every 8 hours 11/14/15 1303 11/14/15 1527   11/14/15 1245  ceFEPIme (MAXIPIME) 2 g in dextrose 5 % 50 mL IVPB     2 g 100 mL/hr over 30 Minutes Intravenous STAT 11/14/15 1238 11/14/15 1403   11/14/15 1245  vancomycin (VANCOCIN) 2,000 mg in sodium chloride 0.9 % 500 mL IVPB     2,000 mg 250 mL/hr over 120 Minutes Intravenous STAT 11/14/15 1238 11/14/15 1613        Objective:   Vitals:   11/15/15 0415 11/15/15 1430 11/15/15 2117 11/16/15 0655  BP: 132/79 107/79 102/72 131/75  Pulse: 65  71 70 88  Resp: 16 18  17   Temp: 98.5 F (36.9 C) 97.5 F (36.4 C) 99.5 F (37.5 C) 98.1 F (36.7 C)  TempSrc: Oral Oral Oral Oral  SpO2: 98% 96% 95% 96%  Weight: 91.6 kg (201 lb 15.1 oz)     Height:        Wt Readings from Last 3 Encounters:  11/15/15 91.6 kg (201 lb 15.1 oz)  11/05/15 101.4 kg (223 lb 8 oz)  08/20/15 113.4 kg (250 lb)     Intake/Output Summary (Last 24 hours) at 11/16/15 1140 Last data filed at 11/15/15 2300  Gross per 24 hour  Intake          1201.25 ml  Output              500 ml  Net           701.25 ml     Physical Exam  Awake Alert, confused Supple Neck,No JVD, No cervical lymphadenopathy appriciated.  Symmetrical Chest wall movement, Good air movement bilaterally, CTAB RRR,No Gallops,Rubs or new Murmurs, No Parasternal Heave +ve B.Sounds, Abd Soft, No tenderness, No rebound - guarding or rigidity. No Cyanosis, Clubbing or edema, No new Rash or bruise      Data Review:    CBC  Recent Labs Lab 11/14/15 1154 11/15/15 0531  WBC 15.6* 11.2*  HGB 16.4 15.5  HCT 51.2 45.7  PLT 312 225  MCV 90.9 89.1  MCH 29.1 30.2  MCHC 32.0 33.9  RDW 17.1* 16.9*    Chemistries   Recent Labs Lab 11/14/15 1154 11/15/15 0008 11/15/15 0531 11/16/15 0531  NA 153* 148*  149* 141  K 4.2 3.8 3.5 3.2*  CL 117* 119* 117* 109  CO2 20* 25 27 27   GLUCOSE 62* 97 104* 116*  BUN 45* 29* 26* 19  CREATININE 1.44* 0.93 0.83 0.82  CALCIUM 8.5* 7.7* 8.1* 8.0*  MG  --   --   --  2.0  AST 71*  --  63* 43*  ALT 40  --  42 42  ALKPHOS 85  --  67 60  BILITOT 2.5*  --  1.9* 1.6*   ------------------------------------------------------------------------------------------------------------------ No results for input(s): CHOL, HDL, LDLCALC, TRIG, CHOLHDL, LDLDIRECT in the last 72 hours.  Lab Results  Component Value Date   HGBA1C 5.8 (H) 104/05/2014    ------------------------------------------------------------------------------------------------------------------ No results for input(s): TSH, T4TOTAL, T3FREE, THYROIDAB in the last 72 hours.  Invalid input(s): FREET3 ------------------------------------------------------------------------------------------------------------------ No results for input(s): VITAMINB12, FOLATE, FERRITIN, TIBC, IRON, RETICCTPCT in the last 72 hours.  Coagulation profile No results for input(s): INR, PROTIME in the last 168 hours.  No results for input(s): DDIMER in the last 72 hours.  Cardiac Enzymes No results for input(s): CKMB, TROPONINI, MYOGLOBIN in the last 168 hours.  Invalid input(s): CK ------------------------------------------------------------------------------------------------------------------    Component Value Date/Time   BNP 28.4 07/31/2014 1927    Inpatient Medications  Scheduled Meds: . amiodarone  100 mg Oral Daily  . amLODipine  5 mg Oral Daily  . aspirin EC  81 mg Oral Daily  . atorvastatin  40 mg Oral QHS  . clonazePAM  0.5 mg Oral Daily  . divalproex  750 mg Oral Daily  . heparin  5,000 Units Subcutaneous Q8H  . levETIRAcetam  500 mg Oral BID  . pantoprazole  40 mg Oral Daily  . phosphorus  500 mg Oral BID  . risperiDONE  1 mg Oral QHS  . sodium chloride flush  3 mL Intravenous Q12H   Continuous Infusions: . dextrose 5 % and 0.45 % NaCl with KCl 20 mEq/L 75 mL/hr at 11/16/15 0819   PRN Meds:.acetaminophen **OR** acetaminophen, hydrALAZINE, ipratropium-albuterol, LORazepam, senna-docusate  Micro Results Recent Results (from the past 240 hour(s))  Culture, blood (Routine X 2) w Reflex to ID Panel     Status: None (Preliminary result)   Collection Time: 11/14/15 11:59 AM  Result Value Ref Range Status   Specimen Description BLOOD RIGHT ANTECUBITAL  Final   Special Requests BOTTLES DRAWN AEROBIC AND ANAEROBIC 5ML  Final   Culture   Final    NO GROWTH 1  DAY Performed at [facility]    Report Status PENDING  Incomplete  Urine culture     Status: None   Collection Time: 11/14/15 12:13 PM  Result Value Ref Range Status   Specimen Description URINE, CATHETERIZED  Final   Special Requests NONE  Final   Culture NO GROWTH Performed at [facility]   Final   Report Status 11/15/2015 FINAL  Final  Culture, blood (Routine X 2) w Reflex to ID Panel     Status: None (Preliminary result)   Collection Time: 11/14/15 12:27 PM  Result Value Ref Range Status   Specimen Description BLOOD LEFT ANTECUBITAL  Final   Special Requests BOTTLES DRAWN AEROBIC AND ANAEROBIC 5ML  Final   Culture   Final    NO GROWTH 1 DAY Performed at [facility]    Report Status PENDING  Incomplete  MRSA PCR Screening     Status: None   Collection Time: 11/14/15  5:15 PM  Result Value Ref Range  Status   MRSA by PCR NEGATIVE NEGATIVE Final    Comment:        The GeneXpert MRSA Assay (FDA approved for NASAL specimens only), is one component of a comprehensive MRSA colonization surveillance program. It is not intended to diagnose MRSA infection nor to guide or monitor treatment for MRSA infections.     Radiology Reports Dg Chest 2 View  Result Date: 11/02/2015 CLINICAL DATA:  Cough. EXAM: CHEST  2 VIEW COMPARISON:  10/29/2015 chest radiograph. FINDINGS: Stable cardiomediastinal silhouette with mild cardiomegaly and aortic atherosclerosis. No pneumothorax. No pleural effusion. No pulmonary edema. Patchy opacity in both lower lobes, not appreciably changed. Stable eventration anterior right hemidiaphragm. IMPRESSION: 1. Stable mild cardiomegaly without overt pulmonary edema . 2. Stable mild patchy bilateral lower lobe opacity, nonspecific, favor atelectasis, cannot exclude a component of aspiration or pneumonia. Electronically Signed   By: Ilona Sorrel M.D.   On: 11/02/2015 16:49   Dg Chest 2 View  Result Date: 10/29/2015 CLINICAL DATA:  Status  post fall x3 today. EXAM: CHEST  2 VIEW COMPARISON:  PA and lateral chest 10/16/2012, 12/30/2012 07/31/2014 and 12/30/2014. CT chest 07/31/2014. FINDINGS: Asymmetric elevation of the right hemidiaphragm is seen on prior exams. The lungs are clear. Heart size is upper normal. No pneumothorax or pleural effusion. Aortic atherosclerosis noted. IMPRESSION: No acute disease. Electronically Signed   By: Inge Rise M.D.   On: 10/29/2015 14:56   Dg Ribs Unilateral W/chest Right  Result Date: 11/02/2015 CLINICAL DATA:  Right rib pain after fall. EXAM: RIGHT RIBS AND CHEST - 3+ VIEW COMPARISON:  Chest radiograph from earlier today. FINDINGS: There are acute mildly displaced fractures of the anterior right sixth and seventh ribs. No additional fracture. No suspicious focal osseous lesion. Stable eventration of the anterior right hemidiaphragm with patchy bibasilar lung opacities. No overt pneumothorax on these three views. IMPRESSION: 1. Acute mildly displaced anterior right sixth and seventh rib fractures. No overt pneumothorax on these rib views . 2. Stable eventration of the anterior right hemidiaphragm with patchy nonspecific bibasilar lung opacities, favor atelectasis, cannot exclude a component of aspiration or pneumonia. Electronically Signed   By: Ilona Sorrel M.D.   On: 11/02/2015 16:54   Dg Wrist Complete Right  Result Date: 10/29/2015 CLINICAL DATA:  65 year old male status post multiple falls at home. Pain. Initial encounter. EXAM: RIGHT WRIST - COMPLETE 3+ VIEW COMPARISON:  None. FINDINGS: Comminuted and dorsally impacted fracture of the distal right radius with radiocarpal joint and DRU involvement. Comminuted mildly displaced fracture of the ulnar styloid. Surrounding soft tissue swelling. Mild widening of the scapholunate interval, but otherwise carpal bone alignment appears preserved. Visible metacarpals appear intact, there is foreshortening of the fifth metacarpal. Calcified peripheral vascular  disease. IMPRESSION: 1. Comminuted and dorsally impacted distal right radius fracture with radiocarpal and probably also DRU involvement. 2. Comminuted mildly displaced fracture of the right ulnar styloid. 3. Widening of the scapholunate interval suspicious for scapholunate ligament injury. Electronically Signed   By: Genevie Ann M.D.   On: 10/29/2015 14:56   Ct Head Wo Contrast  Result Date: 11/14/2015 CLINICAL DATA:  Auditory hallucinations, flight of ideas and agitation. EXAM: CT HEAD WITHOUT CONTRAST TECHNIQUE: Contiguous axial images were obtained from the base of the skull through the vertex without intravenous contrast. COMPARISON:  10/29/2015 CT FINDINGS: Brain: No evidence of acute large vessel territory infarction, hemorrhage, hydrocephalus, extra-axial collection or mass lesion/mass effect. Superficial and central atrophy with chronic appearing small vessel ischemic disease of  periventricular and subcortical white matter. Vascular: No hyperdense vessel or unexpected calcification. Skull: Negative Sinuses/Orbits: Left ocular lens replacement. The visualized paranasal sinuses are clear. Other: Clear mastoids bilaterally. IMPRESSION: Cerebral atrophy with chronic small vessel ischemic disease. No significant change from recent comparison. Electronically Signed   By: [name] Royalty M.D.   On: 11/14/2015 13:28   Ct Head Wo Contrast  Result Date: 10/29/2015 CLINICAL DATA:  Status post fall x3 at home today. The patient reports a blow to the head. Initial encounter. EXAM: CT HEAD WITHOUT CONTRAST CT CERVICAL SPINE WITHOUT CONTRAST TECHNIQUE: Multidetector CT imaging of the head and cervical spine was performed following the standard protocol without intravenous contrast. Multiplanar CT image reconstructions of the cervical spine were also generated. COMPARISON:  Head and cervical spine CT scan 06/26/2014. FINDINGS: CT HEAD FINDINGS Brain: Cortical atrophy and chronic microvascular ischemic change are  identified. No evidence of acute abnormality including hemorrhage, infarct, mass lesion, mass effect, midline shift or abnormal extra-axial fluid collection is identified. No hydrocephalus or pneumocephalus. Vascular: Extensive atherosclerosis is seen. Skull: Intact. Sinuses/Orbits: Unremarkable. Other: None. CT CERVICAL SPINE FINDINGS Alignment: Maintained.  Straightening of lordosis is unchanged. Skull base and vertebrae: Unremarkable. Soft tissues and spinal canal: Unremarkable. Disc levels: Intervertebral disc space height is maintained. Degenerative change at the C1-2 articulation is noted. Upper chest: Emphysematous change in the apices is noted. Other: None. IMPRESSION: No acute abnormality head or cervical spine. Atherosclerosis. Atrophy and chronic microvascular ischemic change. Emphysema. Electronically Signed   By: Inge Rise M.D.   On: 10/29/2015 14:29   Ct Cervical Spine Wo Contrast  Result Date: 10/29/2015 CLINICAL DATA:  Status post fall x3 at home today. The patient reports a blow to the head. Initial encounter. EXAM: CT HEAD WITHOUT CONTRAST CT CERVICAL SPINE WITHOUT CONTRAST TECHNIQUE: Multidetector CT imaging of the head and cervical spine was performed following the standard protocol without intravenous contrast. Multiplanar CT image reconstructions of the cervical spine were also generated. COMPARISON:  Head and cervical spine CT scan 06/26/2014. FINDINGS: CT HEAD FINDINGS Brain: Cortical atrophy and chronic microvascular ischemic change are identified. No evidence of acute abnormality including hemorrhage, infarct, mass lesion, mass effect, midline shift or abnormal extra-axial fluid collection is identified. No hydrocephalus or pneumocephalus. Vascular: Extensive atherosclerosis is seen. Skull: Intact. Sinuses/Orbits: Unremarkable. Other: None. CT CERVICAL SPINE FINDINGS Alignment: Maintained.  Straightening of lordosis is unchanged. Skull base and vertebrae: Unremarkable. Soft  tissues and spinal canal: Unremarkable. Disc levels: Intervertebral disc space height is maintained. Degenerative change at the C1-2 articulation is noted. Upper chest: Emphysematous change in the apices is noted. Other: None. IMPRESSION: No acute abnormality head or cervical spine. Atherosclerosis. Atrophy and chronic microvascular ischemic change. Emphysema. Electronically Signed   By: Inge Rise M.D.   On: 10/29/2015 14:29   Ct Wrist Right Wo Contrast  Result Date: 11/04/2015 CLINICAL DATA:  Muscular dystrophy. Status post fall. Right wrist pain. EXAM: CT OF THE RIGHT WRIST WITHOUT CONTRAST TECHNIQUE: Multidetector CT imaging of the right wrist was performed according to the standard protocol. Multiplanar CT image reconstructions were also generated. COMPARISON:  None. FINDINGS: Bones/Joint/Cartilage Comminuted fracture of the distal radial metaphysis extending to the epiphysis. 8 mm of distraction of the distal radial articular surface. 10 mm of dorsal displacement of the distal dorsal major fracture fragment. Minimally displaced fracture of the ulnar styloid process. Widening of the scapholunate joint most concerning for scapholunate ligament tear. Generalized osteopenia consistent with disuse. No other fracture or dislocation. Mild osteoarthritis  of the first carpometacarpal joint. Ligaments Suboptimally assessed by CT. Muscles and Tendons Suboptimally evaluated by CT. Soft tissues No fluid collection or hematoma. Generalized soft tissue swelling around the wrist. IMPRESSION: 1. Comminuted fracture of the distal radial metaphysis extending to the epiphysis. 8 mm of distraction of the distal radial articular surface. 10 mm of dorsal displacement of the distal dorsal major fracture fragment. 2. Minimally displaced fracture of the ulnar styloid process. 3. Widening of the scapholunate joint most concerning for scapholunate ligament tear. Electronically Signed   By: Kathreen Devoid   On: 11/04/2015 11:27    Dg Chest Port 1 View  Result Date: 11/14/2015 CLINICAL DATA:  Altered mental status. History of hypertension and ischemic heart disease. EXAM: PORTABLE CHEST 1 VIEW COMPARISON:  Chest x-ray 11/02/2015 FINDINGS: The heart is enlarged but stable. Mild tortuosity and calcification of the thoracic aorta is stable. Stable eventration of the right hemidiaphragm with overlying vascular crowding and atelectasis. No infiltrates, edema or effusions. IMPRESSION: Stable cardiac enlargement and elevation of the right hemidiaphragm. No infiltrates or effusions. Electronically Signed   By: Marijo Sanes M.D.   On: 11/14/2015 13:01   Dg Hand Complete Right  Result Date: 10/29/2015 CLINICAL DATA:  65 year old male status post multiple falls at home. Pain. Initial encounter. EXAM: RIGHT HAND - COMPLETE 3+ VIEW COMPARISON:  Right wrist series from today reported separately. FINDINGS: See right wrist series from today reported separately regarding distal radius, ulna, and carpal bone injuries. Foreshortening of the right fifth metacarpal where there may be an underlying healed chronic fracture. No acute metacarpal fracture. No acute phalanx fracture. Distal joint spaces are within normal limits for age. IMPRESSION: 1. See right wrist series from today reported separately. 2. No superimposed acute fracture or dislocation identified in the right hand. Electronically Signed   By: Genevie Ann M.D.   On: 10/29/2015 14:57     ELGERGAWY, DAWOOD M.D on 11/16/2015 at 11:40 AM  Between 7am to 7pm - Pager - [phone]  After 7pm go to [email or web address] - password [facility]  [facility] -  Office  [phone]

## 2015-11-16 NOTE — Progress Notes (Signed)
SW paged physician to inquire about d/c date. SW spoke with facility admissions at [facility] who states pt can return once pt is up for discharge.  Tilda Burrow, MSW [phone]

## 2015-11-17 ENCOUNTER — Inpatient Hospital Stay (HOSPITAL_COMMUNITY): Payer: Medicare Other

## 2015-11-17 ENCOUNTER — Inpatient Hospital Stay (HOSPITAL_COMMUNITY)
Admit: 2015-11-17 | Discharge: 2015-11-17 | Disposition: A | Discharge status: Home / Self Care | Payer: Medicare Other | Attending: Family Medicine | Admitting: Family Medicine

## 2015-11-17 ENCOUNTER — Encounter (HOSPITAL_COMMUNITY): Payer: Self-pay | Admitting: Radiology

## 2015-11-17 LAB — BASIC METABOLIC PANEL
ANION GAP: 5 (ref 5–15)
BUN: 18 mg/dL (ref 6–20)
CHLORIDE: 107 mmol/L (ref 101–111)
CO2: 26 mmol/L (ref 22–32)
Calcium: 7.9 mg/dL — ABNORMAL LOW (ref 8.9–10.3)
Creatinine, Ser: 0.77 mg/dL (ref 0.61–1.24)
GFR calc Af Amer: >60 mL/min (ref >60)
GLUCOSE: 98 mg/dL (ref 65–99)
POTASSIUM: 3.9 mmol/L (ref 3.5–5.1)
Sodium: 138 mmol/L (ref 135–145)

## 2015-11-17 LAB — CBC
HEMATOCRIT: 43.1 % (ref 39.0–52.0)
HEMOGLOBIN: 13.8 g/dL (ref 13.0–17.0)
MCH: 28.9 pg (ref 26.0–34.0)
MCHC: 32 g/dL (ref 30.0–36.0)
MCV: 90.2 fL (ref 78.0–100.0)
PLATELETS: 208 10*3/uL (ref 150–400)
RBC: 4.78 MIL/uL (ref 4.22–5.81)
RDW: 16.5 % — ABNORMAL HIGH (ref 11.5–15.5)
WBC: 11.5 10*3/uL — AB (ref 4.0–10.5)

## 2015-11-17 LAB — AMMONIA: AMMONIA: 75 umol/L — AB (ref 9–35)

## 2015-11-17 LAB — TSH: TSH: 0.959 u[IU]/mL (ref 0.350–4.500)

## 2015-11-17 LAB — VITAMIN B12: Vitamin B-12: 274 pg/mL (ref 180–914)

## 2015-11-17 MED ORDER — IPRATROPIUM-ALBUTEROL 0.5-2.5 (3) MG/3ML IN SOLN: 3.0000 mL | Freq: Four times a day (QID) | RESPIRATORY_TRACT | Status: DC | PRN

## 2015-11-17 MED ORDER — SENNOSIDES-DOCUSATE SODIUM 8.6-50 MG PO TABS: 1.0000 | ORAL_TABLET | Freq: Every evening | ORAL | Status: DC | PRN

## 2015-11-17 MED ORDER — IBUPROFEN 200 MG PO TABS
400.0000 mg | ORAL_TABLET | Freq: Four times a day (QID) | ORAL | Status: DC | PRN
  Administered 2015-11-17 – 2015-11-18 (×2): 400 mg via ORAL
  Filled 2015-11-17 (×2): qty 2

## 2015-11-17 MED ORDER — CLONAZEPAM 0.5 MG PO TABS: 0.5000 mg | ORAL_TABLET | Freq: Every day | ORAL | 0 refills | Status: DC

## 2015-11-17 MED ORDER — IBUPROFEN 400 MG PO TABS: 200.0000 mg | ORAL_TABLET | Freq: Three times a day (TID) | ORAL | 0 refills | Status: DC | PRN

## 2015-11-17 NOTE — Progress Notes (Signed)
PROGRESS NOTE                                                                                                                                                                                                             Patient Demographics:    Patrick Holt, is a 65 y.o. male, DOB - April 21, 1950, [id number]  Admit date - 11/14/2015   Admitting Physician Murlean Iba, MD  Outpatient Primary MD for the patient is [name], MD  LOS - 1   Chief Complaint  Patient presents with  . Altered Mental Status      Brief Narrative   65 y.o. male with history of A. fib, anxiety, bipolar, COPD, HTN, HLD, CAD, seizure disorder, recurrent falls, parkinsonism, presents with altered mental status, workup significant for hypernatremia and dehydration.   Subjective:    Patrick Holt today has, No headache, No chest pain, No abdominal pain - No Nausea, No Cough - SOB.    Assessment  & Plan :    Principal Problem:   Altered mental state Active Problems:   DEPRESSION, MAJOR   TOBACCO USER   ISCHEMIC HEART DISEASE   RAYNAUD'S DISEASE   COPD (chronic obstructive pulmonary disease) (HCC)   GERD   Atrial fibrillation (HCC)   Coronary atherosclerosis of native coronary artery   Severe dehydration   Hypernatremia   Acute encephalopathy - Toxic secondary to dehydration and medication and hypernatremia - CT head is no acute finding, negative urinalysis - Follow on EEG - Patient is extremely poor historian, discussed with  friend Ivin Booty, report patient had progressive decline over last few month, she did notice sharp deterioration of cognition and mental capacity over last 2 weeks, she did report to me patient had previous episode where he required admission to behavioral health for few days, so will workup for organic reason for confusion, will obtain RPR, HIV, B-12, TSH, ammonia, and will check MRI of the brain, as well as some consulted psychiatry  service to evaluate the patient.  Dehydration - Improving, improved oral intake - continue with IV fluids  Hypernatremia - Secondary to dehydration and volume depletion, Resolved , sodium is 141 today, will transition to D5 half-normal saline .  Hypoglycemia - Secondary to poor oral intake, continue to monitor closely  Hyperbilirubinemia - Trending down, most likely indirect bilirubin,  most  likely [place]  COPD - No active wheezing, and tinea with nebs as needed  GERD - Continue with PPIs  Seizures - Resume home  regimen  Depression and anxiety - Continue home medication, he appears to be on Ativan and Klonopin, will discontinue Ativan and genu with Klonopin at much lower dose to prevent withdrawals   Atrial fibrillation - Heart rate controlled, continue  with amiodarone, not a candidate for anticoagulation secondary to multiple falls and history of subdural hematoma  HTN - Continue with home medication  Recent right wrist fracture - Hand surgery consult greatly appreciated, appreciate input, currently with a sling, will need surgical repair, D/W Dr Caralyn Guile, be done as an outpatient, to follow with this week as an outpatient.    Code Status : Full  Family Communication  : D/W friend via phone with his permission, frances, sharon   Disposition Plan  : Back to SNF in 1-2 days  Consults  : Dr. Caralyn Guile hand surgery  Procedures  : none  DVT Prophylaxis  :   Heparin  Lab Results  Component Value Date   PLT 208 11/17/2015    Antibiotics  :    Anti-infectives    Start     Dose/Rate Route Frequency Ordered Stop   11/15/15 1400  vancomycin (VANCOCIN) 1,250 mg in sodium chloride 0.9 % 250 mL IVPB  Status:  Discontinued     1,250 mg 166.7 mL/hr over 90 Minutes Intravenous Every 24 hours 11/14/15 1304 11/14/15 1527   11/14/15 2200  ceFEPIme (MAXIPIME) 1 g in dextrose 5 % 50 mL IVPB  Status:  Discontinued     1 g 100 mL/hr over 30 Minutes Intravenous Every 8 hours  11/14/15 1303 11/14/15 1527   11/14/15 1245  ceFEPIme (MAXIPIME) 2 g in dextrose 5 % 50 mL IVPB     2 g 100 mL/hr over 30 Minutes Intravenous STAT 11/14/15 1238 11/14/15 1403   11/14/15 1245  vancomycin (VANCOCIN) 2,000 mg in sodium chloride 0.9 % 500 mL IVPB     2,000 mg 250 mL/hr over 120 Minutes Intravenous STAT 11/14/15 1238 11/14/15 1613        Objective:   Vitals:   11/16/15 0655 11/16/15 1400 11/16/15 2141 11/17/15 0513  BP: 131/75 118/61 (!) 107/52 90/62  Pulse: 88 62 60 72  Resp: 17 18 18 18   Temp: 98.1 F (36.7 C) 98.2 F (36.8 C) 98.9 F (37.2 C) 98.1 F (36.7 C)  TempSrc: Oral Oral Oral Oral  SpO2: 96% 96% 98% 94%  Weight:    96.5 kg (212 lb 11.9 oz)  Height:        Wt Readings from Last 3 Encounters:  11/17/15 96.5 kg (212 lb 11.9 oz)  11/05/15 101.4 kg (223 lb 8 oz)  08/20/15 113.4 kg (250 lb)     Intake/Output Summary (Last 24 hours) at 11/17/15 1431 Last data filed at 11/17/15 [id number]  Gross per 24 hour  Intake           1837.5 ml  Output              850 ml  Net            987.5 ml     Physical Exam  Awake Alert x3, but confused Supple Neck,No JVD, No cervical lymphadenopathy appriciated.  Symmetrical Chest wall movement, Good air movement bilaterally, CTAB RRR,No Gallops,Rubs or new Murmurs, No Parasternal Heave +ve B.Sounds, Abd Soft, No tenderness, No rebound - guarding or rigidity. No  Cyanosis, Clubbing or edema, No new Rash or bruise      Data Review:    CBC  Recent Labs Lab 11/14/15 1154 11/15/15 0531 11/17/15 0447  WBC 15.6* 11.2* 11.5*  HGB 16.4 15.5 13.8  HCT 51.2 45.7 43.1  PLT 312 225 208  MCV 90.9 89.1 90.2  MCH 29.1 30.2 28.9  MCHC 32.0 33.9 32.0  RDW 17.1* 16.9* 16.5*    Chemistries   Recent Labs Lab 11/14/15 1154 11/15/15 0008 11/15/15 0531 11/16/15 0531 11/17/15 0447  NA 153* 148* 149* 141 138  K 4.2 3.8 3.5 3.2* 3.9  CL 117* 119* 117* 109 107  CO2 20* 25 27 27 26   GLUCOSE 62* 97 104* 116* 98  BUN  45* 29* 26* 19 18  CREATININE 1.44* 0.93 0.83 0.82 0.77  CALCIUM 8.5* 7.7* 8.1* 8.0* 7.9*  MG  --   --   --  2.0  --   AST 71*  --  63* 43*  --   ALT 40  --  42 42  --   ALKPHOS 85  --  67 60  --   BILITOT 2.5*  --  1.9* 1.6*  --    ------------------------------------------------------------------------------------------------------------------ No results for input(s): CHOL, HDL, LDLCALC, TRIG, CHOLHDL, LDLDIRECT in the last 72 hours.  Lab Results  Component Value Date   HGBA1C 5.8 (H) 106/28/2016   ------------------------------------------------------------------------------------------------------------------ No results for input(s): TSH, T4TOTAL, T3FREE, THYROIDAB in the last 72 hours.  Invalid input(s): FREET3 ------------------------------------------------------------------------------------------------------------------ No results for input(s): VITAMINB12, FOLATE, FERRITIN, TIBC, IRON, RETICCTPCT in the last 72 hours.  Coagulation profile No results for input(s): INR, PROTIME in the last 168 hours.  No results for input(s): DDIMER in the last 72 hours.  Cardiac Enzymes No results for input(s): CKMB, TROPONINI, MYOGLOBIN in the last 168 hours.  Invalid input(s): CK ------------------------------------------------------------------------------------------------------------------    Component Value Date/Time   BNP 28.4 07/31/2014 1927    Inpatient Medications  Scheduled Meds: . amiodarone  100 mg Oral Daily  . amLODipine  5 mg Oral Daily  . aspirin EC  81 mg Oral Daily  . atorvastatin  40 mg Oral QHS  . clonazePAM  0.5 mg Oral Daily  . divalproex  750 mg Oral Daily  . heparin  5,000 Units Subcutaneous Q8H  . levETIRAcetam  500 mg Oral BID  . pantoprazole  40 mg Oral Daily  . risperiDONE  1 mg Oral QHS  . sodium chloride flush  3 mL Intravenous Q12H   Continuous Infusions:   PRN Meds:.acetaminophen **OR** acetaminophen, hydrALAZINE, ibuprofen,  ipratropium-albuterol, LORazepam, senna-docusate  Micro Results Recent Results (from the past 240 hour(s))  Culture, blood (Routine X 2) w Reflex to ID Panel     Status: None (Preliminary result)   Collection Time: 11/14/15 11:59 AM  Result Value Ref Range Status   Specimen Description BLOOD RIGHT ANTECUBITAL  Final   Special Requests BOTTLES DRAWN AEROBIC AND ANAEROBIC 5ML  Final   Culture   Final    NO GROWTH 2 DAYS Performed at [facility]    Report Status PENDING  Incomplete  Urine culture     Status: None   Collection Time: 11/14/15 12:13 PM  Result Value Ref Range Status   Specimen Description URINE, CATHETERIZED  Final   Special Requests NONE  Final   Culture NO GROWTH Performed at [facility]   Final   Report Status 11/15/2015 FINAL  Final  Culture, blood (Routine X 2) w Reflex to ID  Panel     Status: None (Preliminary result)   Collection Time: 11/14/15 12:27 PM  Result Value Ref Range Status   Specimen Description BLOOD LEFT ANTECUBITAL  Final   Special Requests BOTTLES DRAWN AEROBIC AND ANAEROBIC 5ML  Final   Culture   Final    NO GROWTH 2 DAYS Performed at [facility]    Report Status PENDING  Incomplete  MRSA PCR Screening     Status: None   Collection Time: 11/14/15  5:15 PM  Result Value Ref Range Status   MRSA by PCR NEGATIVE NEGATIVE Final    Comment:        The GeneXpert MRSA Assay (FDA approved for NASAL specimens only), is one component of a comprehensive MRSA colonization surveillance program. It is not intended to diagnose MRSA infection nor to guide or monitor treatment for MRSA infections.     Radiology Reports Dg Chest 2 View  Result Date: 11/02/2015 CLINICAL DATA:  Cough. EXAM: CHEST  2 VIEW COMPARISON:  10/29/2015 chest radiograph. FINDINGS: Stable cardiomediastinal silhouette with mild cardiomegaly and aortic atherosclerosis. No pneumothorax. No pleural effusion. No pulmonary edema. Patchy opacity in both lower  lobes, not appreciably changed. Stable eventration anterior right hemidiaphragm. IMPRESSION: 1. Stable mild cardiomegaly without overt pulmonary edema . 2. Stable mild patchy bilateral lower lobe opacity, nonspecific, favor atelectasis, cannot exclude a component of aspiration or pneumonia. Electronically Signed   By: Ilona Sorrel M.D.   On: 11/02/2015 16:49   Dg Chest 2 View  Result Date: 10/29/2015 CLINICAL DATA:  Status post fall x3 today. EXAM: CHEST  2 VIEW COMPARISON:  PA and lateral chest 10/16/2012, 12/30/2012 07/31/2014 and 12/30/2014. CT chest 07/31/2014. FINDINGS: Asymmetric elevation of the right hemidiaphragm is seen on prior exams. The lungs are clear. Heart size is upper normal. No pneumothorax or pleural effusion. Aortic atherosclerosis noted. IMPRESSION: No acute disease. Electronically Signed   By: Inge Rise M.D.   On: 10/29/2015 14:56   Dg Ribs Unilateral W/chest Right  Result Date: 11/02/2015 CLINICAL DATA:  Right rib pain after fall. EXAM: RIGHT RIBS AND CHEST - 3+ VIEW COMPARISON:  Chest radiograph from earlier today. FINDINGS: There are acute mildly displaced fractures of the anterior right sixth and seventh ribs. No additional fracture. No suspicious focal osseous lesion. Stable eventration of the anterior right hemidiaphragm with patchy bibasilar lung opacities. No overt pneumothorax on these three views. IMPRESSION: 1. Acute mildly displaced anterior right sixth and seventh rib fractures. No overt pneumothorax on these rib views . 2. Stable eventration of the anterior right hemidiaphragm with patchy nonspecific bibasilar lung opacities, favor atelectasis, cannot exclude a component of aspiration or pneumonia. Electronically Signed   By: Ilona Sorrel M.D.   On: 11/02/2015 16:54   Dg Wrist 2 Views Right  Result Date: 11/16/2015 CLINICAL DATA:  RIGHT wrist pain. RIGHT wrist fracture two weeks prior. EXAM: RIGHT WRIST - 2 VIEW COMPARISON:  Radiograph 10/29/2015 FINDINGS:  Impaction fracture of the distal radial metaphysis with comminution of the epiphysis. The comminuted fragments appears increased in displacement compared to radiograph 10/29/2015. Radiocarpal joint is grossly intact. Ulnar styloid fracture noted. Scaphoid lunate interval is widened to 5 mm. IMPRESSION: Again demonstrated comminuted fracture of the distal RIGHT radius. The comminuted fragments are increased in displacement compared to radiograph of 10/29/2015. Electronically Signed   By: Suzy Bouchard M.D.   On: 11/16/2015 12:47   Dg Wrist Complete Right  Result Date: 10/29/2015 CLINICAL DATA:  65 year old male status post multiple  falls at home. Pain. Initial encounter. EXAM: RIGHT WRIST - COMPLETE 3+ VIEW COMPARISON:  None. FINDINGS: Comminuted and dorsally impacted fracture of the distal right radius with radiocarpal joint and DRU involvement. Comminuted mildly displaced fracture of the ulnar styloid. Surrounding soft tissue swelling. Mild widening of the scapholunate interval, but otherwise carpal bone alignment appears preserved. Visible metacarpals appear intact, there is foreshortening of the fifth metacarpal. Calcified peripheral vascular disease. IMPRESSION: 1. Comminuted and dorsally impacted distal right radius fracture with radiocarpal and probably also DRU involvement. 2. Comminuted mildly displaced fracture of the right ulnar styloid. 3. Widening of the scapholunate interval suspicious for scapholunate ligament injury. Electronically Signed   By: Genevie Ann M.D.   On: 10/29/2015 14:56   Ct Head Wo Contrast  Result Date: 11/14/2015 CLINICAL DATA:  Auditory hallucinations, flight of ideas and agitation. EXAM: CT HEAD WITHOUT CONTRAST TECHNIQUE: Contiguous axial images were obtained from the base of the skull through the vertex without intravenous contrast. COMPARISON:  10/29/2015 CT FINDINGS: Brain: No evidence of acute large vessel territory infarction, hemorrhage, hydrocephalus, extra-axial  collection or mass lesion/mass effect. Superficial and central atrophy with chronic appearing small vessel ischemic disease of periventricular and subcortical white matter. Vascular: No hyperdense vessel or unexpected calcification. Skull: Negative Sinuses/Orbits: Left ocular lens replacement. The visualized paranasal sinuses are clear. Other: Clear mastoids bilaterally. IMPRESSION: Cerebral atrophy with chronic small vessel ischemic disease. No significant change from recent comparison. Electronically Signed   By: [name] Royalty M.D.   On: 11/14/2015 13:28   Ct Head Wo Contrast  Result Date: 10/29/2015 CLINICAL DATA:  Status post fall x3 at home today. The patient reports a blow to the head. Initial encounter. EXAM: CT HEAD WITHOUT CONTRAST CT CERVICAL SPINE WITHOUT CONTRAST TECHNIQUE: Multidetector CT imaging of the head and cervical spine was performed following the standard protocol without intravenous contrast. Multiplanar CT image reconstructions of the cervical spine were also generated. COMPARISON:  Head and cervical spine CT scan 06/26/2014. FINDINGS: CT HEAD FINDINGS Brain: Cortical atrophy and chronic microvascular ischemic change are identified. No evidence of acute abnormality including hemorrhage, infarct, mass lesion, mass effect, midline shift or abnormal extra-axial fluid collection is identified. No hydrocephalus or pneumocephalus. Vascular: Extensive atherosclerosis is seen. Skull: Intact. Sinuses/Orbits: Unremarkable. Other: None. CT CERVICAL SPINE FINDINGS Alignment: Maintained.  Straightening of lordosis is unchanged. Skull base and vertebrae: Unremarkable. Soft tissues and spinal canal: Unremarkable. Disc levels: Intervertebral disc space height is maintained. Degenerative change at the C1-2 articulation is noted. Upper chest: Emphysematous change in the apices is noted. Other: None. IMPRESSION: No acute abnormality head or cervical spine. Atherosclerosis. Atrophy and chronic microvascular  ischemic change. Emphysema. Electronically Signed   By: Inge Rise M.D.   On: 10/29/2015 14:29   Ct Cervical Spine Wo Contrast  Result Date: 10/29/2015 CLINICAL DATA:  Status post fall x3 at home today. The patient reports a blow to the head. Initial encounter. EXAM: CT HEAD WITHOUT CONTRAST CT CERVICAL SPINE WITHOUT CONTRAST TECHNIQUE: Multidetector CT imaging of the head and cervical spine was performed following the standard protocol without intravenous contrast. Multiplanar CT image reconstructions of the cervical spine were also generated. COMPARISON:  Head and cervical spine CT scan 06/26/2014. FINDINGS: CT HEAD FINDINGS Brain: Cortical atrophy and chronic microvascular ischemic change are identified. No evidence of acute abnormality including hemorrhage, infarct, mass lesion, mass effect, midline shift or abnormal extra-axial fluid collection is identified. No hydrocephalus or pneumocephalus. Vascular: Extensive atherosclerosis is seen. Skull: Intact. Sinuses/Orbits: Unremarkable. Other:  None. CT CERVICAL SPINE FINDINGS Alignment: Maintained.  Straightening of lordosis is unchanged. Skull base and vertebrae: Unremarkable. Soft tissues and spinal canal: Unremarkable. Disc levels: Intervertebral disc space height is maintained. Degenerative change at the C1-2 articulation is noted. Upper chest: Emphysematous change in the apices is noted. Other: None. IMPRESSION: No acute abnormality head or cervical spine. Atherosclerosis. Atrophy and chronic microvascular ischemic change. Emphysema. Electronically Signed   By: Inge Rise M.D.   On: 10/29/2015 14:29   Ct Wrist Right Wo Contrast  Result Date: 11/04/2015 CLINICAL DATA:  Muscular dystrophy. Status post fall. Right wrist pain. EXAM: CT OF THE RIGHT WRIST WITHOUT CONTRAST TECHNIQUE: Multidetector CT imaging of the right wrist was performed according to the standard protocol. Multiplanar CT image reconstructions were also generated. COMPARISON:   None. FINDINGS: Bones/Joint/Cartilage Comminuted fracture of the distal radial metaphysis extending to the epiphysis. 8 mm of distraction of the distal radial articular surface. 10 mm of dorsal displacement of the distal dorsal major fracture fragment. Minimally displaced fracture of the ulnar styloid process. Widening of the scapholunate joint most concerning for scapholunate ligament tear. Generalized osteopenia consistent with disuse. No other fracture or dislocation. 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 Ligaments Suboptimally assessed by CT. Muscles and Tendons Suboptimally evaluated by CT. Soft tissues No fluid collection or hematoma. Generalized soft tissue swelling around the wrist. IMPRESSION: 1. Comminuted fracture of the distal radial metaphysis extending to the epiphysis. 8 mm of distraction of the distal radial articular surface. 10 mm of dorsal displacement of the distal dorsal major fracture fragment. 2. Minimally displaced fracture of the ulnar styloid process. 3. Widening of the scapholunate joint most concerning for scapholunate ligament tear. Electronically Signed   By: Kathreen Devoid   On: 11/04/2015 11:27   Dg Chest Port 1 View  Result Date: 11/14/2015 CLINICAL DATA:  Altered mental status. History of hypertension and ischemic heart disease. EXAM: PORTABLE CHEST 1 VIEW COMPARISON:  Chest x-ray 11/02/2015 FINDINGS: The heart is enlarged but stable. Mild tortuosity and calcification of the thoracic aorta is stable. Stable eventration of the right hemidiaphragm with overlying vascular crowding and atelectasis. No infiltrates, edema or effusions. IMPRESSION: Stable cardiac enlargement and elevation of the right hemidiaphragm. No infiltrates or effusions. Electronically Signed   By: Marijo Sanes M.D.   On: 11/14/2015 13:01   Dg Hand Complete Right  Result Date: 10/29/2015 CLINICAL DATA:  65 year old male status post multiple falls at home. Pain. Initial encounter. EXAM: RIGHT  HAND - COMPLETE 3+ VIEW COMPARISON:  Right wrist series from today reported separately. FINDINGS: See right wrist series from today reported separately regarding distal radius, ulna, and carpal bone injuries. Foreshortening of the right fifth metacarpal where there may be an underlying healed chronic fracture. No acute metacarpal fracture. No acute phalanx fracture. Distal joint spaces are within normal limits for age. IMPRESSION: 1. See right wrist series from today reported separately. 2. No superimposed acute fracture or dislocation identified in the right hand. Electronically Signed   By: Genevie Ann M.D.   On: 10/29/2015 14:57     Rekia Kujala M.D on 11/17/2015 at 2:31 PM  Between 7am to 7pm - Pager - [phone]  After 7pm go to [email or web address] - password [facility]  [facility] -  Office  [phone]

## 2015-11-17 NOTE — Progress Notes (Addendum)
LCSW was asked to locate any person in effort to help patient make decisions as he has AMS at the current time. LCSW contacted facility and the only contact is a friend:  Sherril Cong:  [phone] She does not have an active mailbox set up in effort to leave a voice mail.  This is the only contact LCSW is able to locate at this time.   LCSW made contact with patient's friend, whom has no relationship to patient other than helping him because he has no other family or friends as he is estranged.  Larene Beach reports he use to be part of her family through another member, but they do not even know she helps keep up with him and assist where needed. She was updated on care plan and she voiced concerns about his wrist and not having a soft cast on the wrist.  She was updated of ortho's note and possible DC back to SNF today, however LCSW was going to confirm with MD first.  Will follow up with MD and patient friend once final decision made regarding disposition.   Treatment team made aware.  Lane Hacker, MSW Clinical Social Work: [employer] Coverage for :  [phone]

## 2015-11-17 NOTE — Procedures (Signed)
Date of study 11/17/2015 Brief history 65 year old male with altered mental status  . amiodarone  100 mg Oral Daily  . amLODipine  5 mg Oral Daily  . aspirin EC  81 mg Oral Daily  . atorvastatin  40 mg Oral QHS  . clonazePAM  0.5 mg Oral Daily  . divalproex  750 mg Oral Daily  . heparin  5,000 Units Subcutaneous Q8H  . levETIRAcetam  500 mg Oral BID  . pantoprazole  40 mg Oral Daily  . risperiDONE  1 mg Oral QHS  . sodium chloride flush  3 mL Intravenous Q12H    Technical Summary: A multichannel digital EEG recording measured by the international 10-20 system with electrodes applied with paste and impedances below 5000 ohms performed in our laboratory with EKG monitoring in an awake. Hyperventilation and photic stimulation was notperformed. The digital EEG was referentially recorded, reformatted, and digitally filtered in a variety of bipolar and referential montages for optimal display.   Description: During awake posterior dominant rhythm is 8-9 Hz symmetrical reactive and well sustained. Photic stimulation produces normal driving response  Non-REM stage II sleep was not obtained   EKG lead irregular rhythm  Impression: The EEG is is normal with patient recorded in awake state only

## 2015-11-17 NOTE — Care Management Important Message (Signed)
Important Message  Patient Details  Name: Patrick Holt MRN: [id number] Date of Birth: April 28, 1950   Medicare Important Message Given:  Yes    McGibboneyOletta Darter, RN 11/17/2015, 3:57 PM

## 2015-11-17 NOTE — Progress Notes (Signed)
Offsite EEG completed at [facility], results pending. 

## 2015-11-17 NOTE — Consult Note (Signed)
I WAS ASKED TO LOOK AT THIS PATIENT DR. Stann Mainland CONTACTED ME PT WITH RIGHT COMMINUTED DISTAL RADIUS FRACTURE DID NOT FOLLOW UP IN OFFICE PT WITH SIGNIFICANT FRACTURE, WILL REQUIRE STABILIZATION OF FRACTURE PT ADMITTED FOR MENTAL STATUS CHANGES WOULD LIKE TO CARE FOR HIS WRIST WHILE HE IS IN HOSPITAL WILL LIKELY PLAN FOR ORIF WITH SPANNING WRIST PLATE, INTERNAL EXTERNAL FIXATOR PLEASE CONTACT ME DIRECTLY IF ANY QUESTIONS [phone] CONTINUE WITH CURRENT SPLINT ICE ELEVATE

## 2015-11-18 ENCOUNTER — Inpatient Hospital Stay (HOSPITAL_COMMUNITY): Payer: Medicare Other

## 2015-11-18 DIAGNOSIS — Z823 Family history of stroke: Secondary | ICD-10-CM

## 2015-11-18 DIAGNOSIS — K219 Gastro-esophageal reflux disease without esophagitis: Secondary | ICD-10-CM | POA: Diagnosis not present

## 2015-11-18 DIAGNOSIS — I2510 Atherosclerotic heart disease of native coronary artery without angina pectoris: Secondary | ICD-10-CM | POA: Diagnosis not present

## 2015-11-18 DIAGNOSIS — H8110 Benign paroxysmal vertigo, unspecified ear: Secondary | ICD-10-CM | POA: Diagnosis not present

## 2015-11-18 DIAGNOSIS — M79601 Pain in right arm: Secondary | ICD-10-CM | POA: Diagnosis not present

## 2015-11-18 DIAGNOSIS — Z818 Family history of other mental and behavioral disorders: Secondary | ICD-10-CM

## 2015-11-18 DIAGNOSIS — N401 Enlarged prostate with lower urinary tract symptoms: Secondary | ICD-10-CM | POA: Diagnosis not present

## 2015-11-18 DIAGNOSIS — Z833 Family history of diabetes mellitus: Secondary | ICD-10-CM

## 2015-11-18 DIAGNOSIS — Z87891 Personal history of nicotine dependence: Secondary | ICD-10-CM | POA: Diagnosis not present

## 2015-11-18 DIAGNOSIS — R4182 Altered mental status, unspecified: Secondary | ICD-10-CM | POA: Diagnosis not present

## 2015-11-18 DIAGNOSIS — M79602 Pain in left arm: Secondary | ICD-10-CM | POA: Diagnosis not present

## 2015-11-18 DIAGNOSIS — J449 Chronic obstructive pulmonary disease, unspecified: Secondary | ICD-10-CM | POA: Diagnosis not present

## 2015-11-18 DIAGNOSIS — W19XXXA Unspecified fall, initial encounter: Secondary | ICD-10-CM | POA: Diagnosis not present

## 2015-11-18 DIAGNOSIS — Z4789 Encounter for other orthopedic aftercare: Secondary | ICD-10-CM | POA: Diagnosis not present

## 2015-11-18 DIAGNOSIS — F259 Schizoaffective disorder, unspecified: Secondary | ICD-10-CM | POA: Diagnosis not present

## 2015-11-18 DIAGNOSIS — M6281 Muscle weakness (generalized): Secondary | ICD-10-CM | POA: Diagnosis not present

## 2015-11-18 DIAGNOSIS — I482 Chronic atrial fibrillation: Secondary | ICD-10-CM | POA: Diagnosis not present

## 2015-11-18 DIAGNOSIS — I10 Essential (primary) hypertension: Secondary | ICD-10-CM | POA: Diagnosis not present

## 2015-11-18 DIAGNOSIS — Z811 Family history of alcohol abuse and dependence: Secondary | ICD-10-CM

## 2015-11-18 DIAGNOSIS — E785 Hyperlipidemia, unspecified: Secondary | ICD-10-CM | POA: Diagnosis not present

## 2015-11-18 DIAGNOSIS — E7220 Disorder of urea cycle metabolism, unspecified: Secondary | ICD-10-CM | POA: Diagnosis not present

## 2015-11-18 DIAGNOSIS — Z79899 Other long term (current) drug therapy: Secondary | ICD-10-CM

## 2015-11-18 DIAGNOSIS — Z8249 Family history of ischemic heart disease and other diseases of the circulatory system: Secondary | ICD-10-CM

## 2015-11-18 LAB — FOLATE: FOLATE: 5.4 ng/mL — AB (ref >5.9)

## 2015-11-18 LAB — RPR: RPR Ser Ql: NONREACTIVE

## 2015-11-18 LAB — HIV ANTIBODY (ROUTINE TESTING W REFLEX): HIV SCREEN 4TH GENERATION: NONREACTIVE

## 2015-11-18 MED ORDER — FOLIC ACID 1 MG PO TABS
2.0000 mg | ORAL_TABLET | Freq: Every day | ORAL | Status: DC
  Filled 2015-11-18: qty 2

## 2015-11-18 MED ORDER — CLONAZEPAM 0.5 MG PO TABS: 0.5000 mg | ORAL_TABLET | Freq: Every day | ORAL | 0 refills | Status: DC

## 2015-11-18 MED ORDER — LACTULOSE 10 GM/15ML PO SOLN: 20.0000 g | Freq: Two times a day (BID) | ORAL | 0 refills | Status: DC

## 2015-11-18 MED ORDER — LACTULOSE 10 GM/15ML PO SOLN
20.0000 g | Freq: Two times a day (BID) | ORAL | Status: DC
  Administered 2015-11-18: 20 g via ORAL
  Filled 2015-11-18: qty 30

## 2015-11-18 MED ORDER — CYANOCOBALAMIN 1000 MCG/ML IJ SOLN
1000.0000 ug | Freq: Every day | INTRAMUSCULAR | Status: DC
  Administered 2015-11-18: 1000 ug via INTRAMUSCULAR
  Filled 2015-11-18: qty 1

## 2015-11-18 MED ORDER — RISPERIDONE 1 MG PO TABS: 1.0000 mg | ORAL_TABLET | Freq: Two times a day (BID) | ORAL | Status: DC

## 2015-11-18 MED ORDER — CYANOCOBALAMIN 1000 MCG/ML IJ SOLN: INTRAMUSCULAR | 0 refills | Status: DC

## 2015-11-18 MED ORDER — DIVALPROEX SODIUM ER 250 MG PO TB24: 750.0000 mg | ORAL_TABLET | Freq: Every day | ORAL | Status: DC

## 2015-11-18 MED ORDER — FOLIC ACID 1 MG PO TABS: 2.0000 mg | ORAL_TABLET | Freq: Every day | ORAL | Status: DC

## 2015-11-18 NOTE — Progress Notes (Signed)
Patient is picked up by [facility] via stretcher in stable condition.

## 2015-11-18 NOTE — Discharge Instructions (Signed)
Follow with Primary MD [name], MD after discharge from SNF - She need to follow with orthopedic/hand surgeon Dr. Apolonio Schneiders by end of the week  Get CBC, CMP,checked  by Primary MD next visit.    Activity: As tolerated with Full fall precautions use walker/cane & assistance as needed   Disposition SNF   Diet: Heart Healthy , with feeding assistance and aspiration precautions.  For Heart failure patients - Check your Weight same time everyday, if you gain over 2 pounds, or you develop in leg swelling, experience more shortness of breath or chest pain, call your Primary MD immediately. Follow Cardiac Low Salt Diet and 1.5 lit/day fluid restriction.   On your next visit with your primary care physician please Get Medicines reviewed and adjusted.   Please request your Prim.MD to go over all Hospital Tests and Procedure/Radiological results at the follow up, please get all Hospital records sent to your Prim MD by signing hospital release before you go home.   If you experience worsening of your admission symptoms, develop shortness of breath, life threatening emergency, suicidal or homicidal thoughts you must seek medical attention immediately by calling 911 or calling your MD immediately  if symptoms less severe.  You Must read complete instructions/literature along with all the possible adverse reactions/side effects for all the Medicines you take and that have been prescribed to you. Take any new Medicines after you have completely understood and accpet all the possible adverse reactions/side effects.   Do not drive, operating heavy machinery, perform activities at heights, swimming or participation in water activities or provide baby sitting services if your were admitted for syncope or siezures until you have seen by Primary MD or a Neurologist and advised to do so again.  Do not drive when taking Pain medications.    Do not take more than prescribed Pain, Sleep and Anxiety  Medications  Special Instructions: If you have smoked or chewed Tobacco  in the last 2 yrs please stop smoking, stop any regular Alcohol  and or any Recreational drug use.  Wear Seat belts while driving.   Please note  You were cared for by a hospitalist during your hospital stay. If you have any questions about your discharge medications or the care you received while you were in the hospital after you are discharged, you can call the unit and asked to speak with the hospitalist on call if the hospitalist that took care of you is not available. Once you are discharged, your primary care physician will handle any further medical issues. Please note that NO REFILLS for any discharge medications will be authorized once you are discharged, as it is imperative that you return to your primary care physician (or establish a relationship with a primary care physician if you do not have one) for your aftercare needs so that they can reassess your need for medications and monitor your lab values.
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
## 2015-11-18 NOTE — Progress Notes (Signed)
Report called to Bre at [facility]  All questions answered.  Discharge summary printed and placed in packet.  Pt will be discharge via [facility]AR.

## 2015-11-18 NOTE — Progress Notes (Signed)
Physical Therapy Treatment Patient Details Name: Patrick Holt MRN: [id number] DOB: [birth year] Today's Date: 11/18/2015    History of Present Illness Patrick Holt is a 65 y.o. male with history of A. fib, anxiety, bipolar, COPD, HTN, HLD, CAD, seizure disorder, recurrent falls, parkinsonism, recently admitted from 917-924 for hypoxia, cough, shortness of breath was found to have rib fractures  and right wrist fx from a fall on September 13,  and was discharged to SNF.  The [facility] skilled nursing facility sent him to the ER 9/29  for evaluation of 3-4 days worsening altered mental status    PT Comments    Pt agreeable to bed level exercises. Cues to stay on task. Pt talked the entire time about multiple subjects. Recommend SNF.   Follow Up Recommendations  SNF     Equipment Recommendations  None recommended by PT    Recommendations for Other Services       Precautions / Restrictions Precautions Precautions: Fall Precaution Comments:  R wrist fx, R 6th & 7th rib fxs Required Braces or Orthoses: Other Brace/Splint Other Brace/Splint: splint on R UE. previous admission states a forearm sugartong  spint was placed by ortho tech. Per notes, patient was to f/u with Dr. Burney Gauze. Restrictions Weight Bearing Restrictions: Yes RUE Weight Bearing: Non weight bearing    Mobility  Bed Mobility                  Transfers                    Ambulation/Gait                 Stairs            Wheelchair Mobility    Modified Rankin (Stroke Patients Only)       Balance                                    Cognition Arousal/Alertness: Awake/alert Behavior During Therapy: WFL for tasks assessed/performed Overall Cognitive Status: No family/caregiver present to determine baseline cognitive functioning                      Exercises General Exercises - Upper Extremity Shoulder Flexion: AROM;Left;10  reps;Supine Elbow Flexion: AROM;Left;10 reps;Supine General Exercises - Lower Extremity Ankle Circles/Pumps: AROM;Both;10 reps;Supine Heel Slides: AROM;AAROM;Both;10 reps;Supine Hip ABduction/ADduction: AROM;AAROM;Both;10 reps;Supine    General Comments        Pertinent Vitals/Pain Pain Assessment: 0-10 Pain Score:  ("15") Pain Location: R wrist Pain Intervention(s): Monitored during session    Home Living                      Prior Function            PT Goals (current goals can now be found in the care plan section) Progress towards PT goals: Progressing toward goals    Frequency    Min 3X/week      PT Plan Current plan remains appropriate    Co-evaluation             End of Session   Activity Tolerance: Patient tolerated treatment well Patient left: in bed;with call bell/phone within reach;with bed alarm set     Time: 1526-1550 PT Time Calculation (min) (ACUTE ONLY): 24 min  Charges:  $Therapeutic Exercise: 8-22 mins  G Codes:      Patrick Holt, MPT Pager: [phone]
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
## 2015-11-18 NOTE — Progress Notes (Signed)
Patient is set to discharge back to [facility] today. Patient & friend, Ivin Booty aware. Discharge packet given to RN, Joellen Jersey. [facility] called for transport.     Raynaldo Opitz, [facility] Clinical Social Worker cell #: [phone]

## 2015-11-18 NOTE — Progress Notes (Signed)
Nutrition Follow-up  DOCUMENTATION CODES:   Obesity unspecified  INTERVENTION:  Continue chopped meats with well cooked vegetables and soft foods in addition to Heart Healthy diet.   NUTRITION DIAGNOSIS:   Inadequate oral intake related to other (see comment) (pt endentulous) as evidenced by per patient/family report.  Improved with chopped meats and soft foods.  GOAL:   Patient will meet greater than or equal to 90% of their needs  Met.   MONITOR:   PO intake, Labs, Weight trends, I & O's  REASON FOR ASSESSMENT:   Consult Assessment of nutrition requirement/status  ASSESSMENT:   65 y.o. male with history of A. fib, anxiety, bipolar, COPD, HTN, HLD, CAD, seizure disorder, recurrent falls, parkinsonism, presents with altered mental status, workup significant for hypernatremia and dehydration.  Pt remains confused and needing redirection during assessment. He is very hungry now since he was made NPO earlier today for ultrasound. He reports his appetite is very good and intake has improved. He does not want gravy coming on his chopped meats.  Meal Completion: 100% In the past 24 hrs, pt has had 1874 kcal (100% needs) and 93 grams protein (100% needs).  Medications reviewed and include: Vitamin B12 1000 mcg daily, pantoprazole, lactulose 20 grams BID.  Labs reviewed: CBG 128-129, Folate 5.4 (low), Vitamin B12 WNL, Ammonia 75.  Discussed plan with RN.   Diet Order:  Diet Heart Room service appropriate? No; Fluid consistency: Thin  Skin:  Reviewed, no issues  Last BM:  9/30  Height:   Ht Readings from Last 1 Encounters:  11/14/15 _0  (1.6 m)    Weight:   Wt Readings from Last 1 Encounters:  11/18/15 214 lb 1.1 oz (97.1 kg)    Ideal Body Weight:  52.3 kg  BMI:  Body mass index is 37.92 kg/m.  Estimated Nutritional Needs:   Kcal:  1550-1750  Protein:  60-70g  Fluid:  1.7L/day  EDUCATION NEEDS:   No education needs identified at this  time  Willey Blade, MS, RD, LDN Pager: [phone] After Hours Pager: [phone]

## 2015-11-18 NOTE — Consult Note (Signed)
   [facility] CM Inpatient Consult   11/18/2015  Patrick Holt January 12, 1951 [id number]     Patient screened for potential [facility] Care Management services. Chart reviewed. Noted discharge plan is for SNF.  There are no identifiable [facility] Care Management needs at this time. If patient's post hospital needs change, please place a [facility] Care Management consult. For questions please contact:  Marthenia Rolling, [place], RN,BSN [facility] Liaison [phone]

## 2015-11-18 NOTE — Discharge Summary (Addendum)
ALESSANDRO PRISOCK, is a 65 y.o. male  DOB [birth year]  MRN [id number].  Admission date:  11/14/2015  Admitting Physician  Murlean Iba, MD  Discharge Date:  11/18/2015   Primary MD  [name], MD  Recommendations for primary care physician for things to follow:  - Please check CBC, CMP in 1 week. - Patient  to follow with Dr. Caralyn Guile from orthopedic within 1 week regarding right wrist surgery, meanwhile to continue with right forearm sling. - Patient is on 2 L nasal cannula as needed   Admission Diagnosis  Simple chronic bronchitis ([place]) [J41.0] Dehydration [E86.0] Hypernatremia [E87.0] AKI (acute kidney injury) ([place]) [N17.9] Altered mental status, unspecified altered mental status type [R41.82]   Discharge Diagnosis  Simple chronic bronchitis ([place]) [J41.0] Dehydration [E86.0] Hypernatremia [E87.0] AKI (acute kidney injury) ([place]) [N17.9] Altered mental status, unspecified altered mental status type [R41.82]   Principal Problem:   Altered mental state Active Problems:   DEPRESSION, MAJOR   TOBACCO USER   ISCHEMIC HEART DISEASE   RAYNAUD'S DISEASE   COPD (chronic obstructive pulmonary disease) (HCC)   GERD   Atrial fibrillation (HCC)   Coronary atherosclerosis of native coronary artery   Severe dehydration   Hypernatremia      Past Medical History:  Diagnosis Date  . Anemia   . Anxiety   . Asthma   . Atrial fibrillation ([place]) 01/2012   with RVR  . Bipolar 1 disorder ([place])   . Cancer ([place])   . Collagen vascular disease ([place])   . Conversion disorder 06/2010  . COPD (chronic obstructive pulmonary disease) ([place])   . Dermatomyositis ([place])   . Dermatomyositis ([place])   . Diverticulitis   . Diverticulosis of colon   . Dysrhythmia    "irregular" (11/15/2012)  . Esophageal dysmotility   . Esophageal stricture   . Fibromyalgia   . Gastritis   . GERD (gastroesophageal reflux  disease)   . Headache(784.0)    "severe; get them often" (11/15/2012)  . Hiatal hernia   . History of narcotic addiction ([place])   . Hx of adenomatous colonic polyps   . Hyperlipidemia   . Hypertension   . Internal hemorrhoids   . Ischemic heart disease   . Major depression    with acute psychotic break in 06/2010  . Myocardial infarct    mulitple (1999, 2000, 2004)  . Narcotic dependence ([place])   . Nephrolithiasis   . Obesity   . OCD (obsessive compulsive disorder)   . Otosclerosis   . Paroxysmal a-fib ([place])   . Peripheral neuropathy ([place])   . Raynaud's disease   . Renal insufficiency   . Rheumatoid arthritis(714.0)   . Sarcoidosis ([place])   . Seizures ([place])   . Subarachnoid hemorrhage ([place]) 01/2012   with subdural  hematoma.   . Type II diabetes mellitus ([place])   . Urge incontinence   . Vertigo     Past Surgical History:  Procedure Laterality Date  . BACK SURGERY    . CARDIAC CATHETERIZATION    .  CARPAL TUNNEL RELEASE Bilateral   . CATARACT EXTRACTION W/ INTRAOCULAR LENS IMPLANT Left   . COLONOSCOPY N/A 09/27/2012   Procedure: COLONOSCOPY;  Surgeon: Lafayette Dragon, MD;  Location: [facility];  Service: Endoscopy;  Laterality: N/A;  . ESOPHAGOGASTRODUODENOSCOPY N/A 09/27/2012   Procedure: ESOPHAGOGASTRODUODENOSCOPY (EGD);  Surgeon: Lafayette Dragon, MD;  Location: [facility];  Service: Endoscopy;  Laterality: N/A;  . KNEE ARTHROSCOPY W/ MENISCAL REPAIR Left 2009  . LUMBAR DISC SURGERY    . LYMPH NODE DISSECTION Right 1970's   "neck; dr thought I had Hodgkins; turned out to be sarcoidosis" (11/15/2012)  . squamous papilloma   2010   removed by Dr. Constance Holster ENT, noted on tongue  . TONSILLECTOMY         History of present illness and  Hospital Course:     Kindly see H&P for history of present illness and admission details, please review complete Labs, Consult reports and Test reports for all details in brief  HPI  from the history and physical done on the day of admission  11/14/2015  HPI: MABEL ARNS is a 65 y.o. male with history of A. fib, anxiety, bipolar, COPD, HTN, HLD, CAD, seizure disorder, recurrent falls, parkinsonism, recently admitted from [id number] hypoxia, cough, shortness of breath was found to have rib fractures from a fall on September 13, improved during hospitalization and was discharged to SNF. The [facility] nursing facility sent him to the ER today for evaluation of 3-4 days worsening altered mental status. He was given Ativan and risperadone, w/o improvement. HE became increasingly agitated, was hearing voices, but started to crawl the floor and is rambling with flight of ideas. He was sent to the ER for psychiatric evaluation. He presented to the ER via EMS only oriented to person, and cannot provide medical history or answer questions appropriately. Remaining history is obtained from chart review. Patient has a history of benzo dependence, no documented history of alcohol abuse.   In the ED he was noted to be severely dehydrated with hypernatremia and other electrolyte abnormalities.  He is being admitted for further evaluation and management.     Hospital Course    Acute encephalopathy - Appears to be multifactorial, due to psychiatric and toxic encephalopathy - Toxic encephalopathy secondary to medication, has been on Ativan and Klonopin, as well secondary to hypernatremia, significantly improved after holding Ativan and decreasing Klonopin, and hypernatremia corrected - As well with significant B-12 and folate deficiency, very likely contributing to it, started on replacement therapy - Elevated ammonia level, started on lactulose. - EEG with no acute findings, no evidence of seizures. - As well patient with significant psychiatric history, with a schizoaffective disorder requiring inpatient hospitalization in June 2016 at [facility], psychiatric consultation appreciated, medication has been  adjusted per their recommendation on discharge.  Dehydration - Improving, improved oral intake  Hyperammonemia - Unclear etiology, ammonia level was elevated at 75, started on lactulose, will need to titrate for 2-3 bowel movements per day -  normal right upper quadrant ultrasound   Hypernatremia - Secondary to dehydration and volume depletion, Resolved ,   Hypoglycemia - Secondary to poor oral intake, currently improved oral intake, no recurrence of hypoglycemia  Hyperbilirubinemia - Trending down, mainly indirect bilirubin,  most likely Gilbert  COPD - No active wheezing, and tinea with nebs as needed  GERD - Continue with PPIs  Seizures - Resume home  regimen  Depression and anxiety as well schizoaffective disorder - Continue  with Klonopin, medication has been adjusted by psychiatry, continue with risperidone at 1 mg twice a day, Keppra 500 mg twice a day, Depakote was increased from 500-750 mg twice a day  Atrial fibrillation - Heart rate controlled, continue  with amiodarone, not a candidate for anticoagulation secondary to multiple falls and history of subdural hematoma continue with aspirin  HTN - Continue with home medication  Recent right wrist fracture - Hand surgery consult greatly appreciated, appreciate input, currently with a splint, will need surgical repair, D/W Dr Caralyn Guile, be done as an outpatient, to follow with this week as an outpatient.      Discharge Condition:  Stable   Follow UP   Contact information for follow-up providers    [name], MD .   Specialty:  Family Medicine Contact information: [place] [address] [phone]        ORTMANN,FRED W, MD .   Specialty:  Orthopedic Surgery Why:  to follow regarding right wrist fracture. make an appointment within 1 week Contact information: [address] [place] [address] [phone]            Contact information for  after-discharge care    [facility] .   Specialty:  [place] information: [address] [place] [phone]                    Discharge Instructions  and  Discharge Medications     Discharge Instructions    Discharge instructions    Complete by:  As directed    Follow with Primary MD [name], MD after discharge from SNF - She need to follow with orthopedic/hand surgeon Dr. Apolonio Schneiders by end of the week  Get CBC, CMP,checked  by Primary MD next visit.    Activity: As tolerated with Full fall precautions use walker/cane & assistance as needed   Disposition SNF   Diet: Heart Healthy , with feeding assistance and aspiration precautions.  For Heart failure patients - Check your Weight same time everyday, if you gain over 2 pounds, or you develop in leg swelling, experience more shortness of breath or chest pain, call your Primary MD immediately. Follow Cardiac Low Salt Diet and 1.5 lit/day fluid restriction.   On your next visit with your primary care physician please Get Medicines reviewed and adjusted.   Please request your Prim.MD to go over all Hospital Tests and Procedure/Radiological results at the follow up, please get all Hospital records sent to your Prim MD by signing hospital release before you go home.   If you experience worsening of your admission symptoms, develop shortness of breath, life threatening emergency, suicidal or homicidal thoughts you must seek medical attention immediately by calling 911 or calling your MD immediately  if symptoms less severe.  You Must read complete instructions/literature along with all the possible adverse reactions/side effects for all the Medicines you take and that have been prescribed to you. Take any new Medicines after you have completely understood and accpet all the possible adverse reactions/side effects.   Do not drive,  operating heavy machinery, perform activities at heights, swimming or participation in water activities or provide baby sitting services if your were admitted for syncope or siezures until you have seen by Primary MD or a Neurologist and advised to do so again.  Do not drive when taking Pain medications.    Do not take  more than prescribed Pain, Sleep and Anxiety Medications  Special Instructions: If you have smoked or chewed Tobacco  in the last 2 yrs please stop smoking, stop any regular Alcohol  and or any Recreational drug use.  Wear Seat belts while driving.   Please note  You were cared for by a hospitalist during your hospital stay. If you have any questions about your discharge medications or the care you received while you were in the hospital after you are discharged, you can call the unit and asked to speak with the hospitalist on call if the hospitalist that took care of you is not available. Once you are discharged, your primary care physician will handle any further medical issues. Please note that NO REFILLS for any discharge medications will be authorized once you are discharged, as it is imperative that you return to your primary care physician (or establish a relationship with a primary care physician if you do not have one) for your aftercare needs so that they can reassess your need for medications and monitor your lab values.   Increase activity slowly    Complete by:  As directed        Medication List    STOP taking these medications   ALPRAZolam 0.5 MG tablet Commonly known as:  XANAX   LORazepam 1 MG tablet Commonly known as:  ATIVAN   naproxen 500 MG tablet Commonly known as:  NAPROSYN     TAKE these medications   amiodarone 200 MG tablet Commonly known as:  PACERONE Take 0.5 tablets (100 mg total) by mouth daily.   amLODipine 5 MG tablet Commonly known as:  NORVASC Take 1 tablet (5 mg total) by mouth daily.   aspirin EC 81 MG tablet Take 1 tablet  (81 mg total) by mouth daily.   atorvastatin 40 MG tablet Commonly known as:  LIPITOR Take 1 tablet (40 mg total) by mouth daily. What changed:  when to take this   clonazePAM 0.5 MG tablet Commonly known as:  KLONOPIN Take 1 tablet (0.5 mg total) by mouth daily. What changed:  medication strength  how much to take  when to take this   cyanocobalamin 1000 MCG/ML injection Commonly known as:  (VITAMIN B-12) Please give IM once daily for 3 days, then once weekly for 4 weeks, then once monthly thereafter   divalproex 250 MG 24 hr tablet Commonly known as:  DEPAKOTE ER Take 3 tablets (750 mg total) by mouth daily. Start taking on:  [id number]   folic acid 1 MG tablet Commonly known as:  FOLVITE Take 2 tablets (2 mg total) by mouth daily.   guaiFENesin 600 MG 12 hr tablet Commonly known as:  MUCINEX Take 1 tablet (600 mg total) by mouth 2 (two) times daily. OTC   ibuprofen 400 MG tablet Commonly known as:  ADVIL,MOTRIN Take 0.5 tablets (200 mg total) by mouth every 8 (eight) hours as needed for moderate pain.   ipratropium-albuterol 0.5-2.5 (3) MG/3ML Soln Commonly known as:  DUONEB Inhale 3 mLs into the lungs every 6 (six) hours as needed.   lactulose 10 GM/15ML solution Commonly known as:  CHRONULAC Take 30 mLs (20 g total) by mouth 2 (two) times daily. Please titrate for 2-3 soft bowel movements per day   levETIRAcetam 500 MG tablet Commonly known as:  KEPPRA TAKE 1 TABLET(500 MG) BY MOUTH TWICE DAILY   loperamide 2 MG capsule Commonly known as:  IMODIUM TAKE 1 TO 2 CAPSULES BY MOUTH 3 TIMES  A DAY AS NEEDED FOR DIARRHEA What changed:  how much to take  how to take this  when to take this  reasons to take this  additional instructions   meclizine 25 MG tablet Commonly known as:  ANTIVERT TAKE 1 TABLET (25 MG TOTAL) BY MOUTH 3 (THREE) TIMES DAILY AS NEEDED. What changed:  how much to take  how to take this  when to take this  reasons to take  this  additional instructions   multivitamin with minerals Tabs tablet Take 1 tablet by mouth daily.   omeprazole 20 MG capsule Commonly known as:  PRILOSEC Take 20 mg by mouth daily.   Potassium Chloride ER 20 MEQ Tbcr Take 1 tablet by mouth every other day.   risperiDONE 1 MG tablet Commonly known as:  RISPERDAL Take 1 tablet (1 mg total) by mouth 2 (two) times daily. What changed:  when to take this   senna-docusate 8.6-50 MG tablet Commonly known as:  Senokot-S Take 1 tablet by mouth at bedtime as needed for mild constipation.         Diet and Activity recommendation: See Discharge Instructions above  Consults obtained -  Psychiatrically   Major procedures and Radiology Reports - PLEASE review detailed and final reports for all details, in brief -      Dg Chest 2 View  Result Date: 11/02/2015 CLINICAL DATA:  Cough. EXAM: CHEST  2 VIEW COMPARISON:  10/29/2015 chest radiograph. FINDINGS: Stable cardiomediastinal silhouette with mild cardiomegaly and aortic atherosclerosis. No pneumothorax. No pleural effusion. No pulmonary edema. Patchy opacity in both lower lobes, not appreciably changed. Stable eventration anterior right hemidiaphragm. IMPRESSION: 1. Stable mild cardiomegaly without overt pulmonary edema . 2. Stable mild patchy bilateral lower lobe opacity, nonspecific, favor atelectasis, cannot exclude a component of aspiration or pneumonia. Electronically Signed   By: Ilona Sorrel M.D.   On: 11/02/2015 16:49   Dg Chest 2 View  Result Date: 10/29/2015 CLINICAL DATA:  Status post fall x3 today. EXAM: CHEST  2 VIEW COMPARISON:  PA and lateral chest 10/16/2012, 12/30/2012 07/31/2014 and 12/30/2014. CT chest 07/31/2014. FINDINGS: Asymmetric elevation of the right hemidiaphragm is seen on prior exams. The lungs are clear. Heart size is upper normal. No pneumothorax or pleural effusion. Aortic atherosclerosis noted. IMPRESSION: No acute disease. Electronically Signed   By:  Inge Rise M.D.   On: 10/29/2015 14:56   Dg Ribs Unilateral W/chest Right  Result Date: 11/02/2015 CLINICAL DATA:  Right rib pain after fall. EXAM: RIGHT RIBS AND CHEST - 3+ VIEW COMPARISON:  Chest radiograph from earlier today. FINDINGS: There are acute mildly displaced fractures of the anterior right sixth and seventh ribs. No additional fracture. No suspicious focal osseous lesion. Stable eventration of the anterior right hemidiaphragm with patchy bibasilar lung opacities. No overt pneumothorax on these three views. IMPRESSION: 1. Acute mildly displaced anterior right sixth and seventh rib fractures. No overt pneumothorax on these rib views . 2. Stable eventration of the anterior right hemidiaphragm with patchy nonspecific bibasilar lung opacities, favor atelectasis, cannot exclude a component of aspiration or pneumonia. Electronically Signed   By: Ilona Sorrel M.D.   On: 11/02/2015 16:54   Dg Wrist 2 Views Right  Result Date: 11/16/2015 CLINICAL DATA:  RIGHT wrist pain. RIGHT wrist fracture two weeks prior. EXAM: RIGHT WRIST - 2 VIEW COMPARISON:  Radiograph 10/29/2015 FINDINGS: Impaction fracture of the distal radial metaphysis with comminution of the epiphysis. The comminuted fragments appears increased in displacement compared to radiograph  10/29/2015. Radiocarpal joint is grossly intact. Ulnar styloid fracture noted. Scaphoid lunate interval is widened to 5 mm. IMPRESSION: Again demonstrated comminuted fracture of the distal RIGHT radius. The comminuted fragments are increased in displacement compared to radiograph of 10/29/2015. Electronically Signed   By: Suzy Bouchard M.D.   On: 11/16/2015 12:47   Dg Wrist Complete Right  Result Date: 10/29/2015 CLINICAL DATA:  65 year old male status post multiple falls at home. Pain. Initial encounter. EXAM: RIGHT WRIST - COMPLETE 3+ VIEW COMPARISON:  None. FINDINGS: Comminuted and dorsally impacted fracture of the distal right radius with  radiocarpal joint and DRU involvement. Comminuted mildly displaced fracture of the ulnar styloid. Surrounding soft tissue swelling. Mild widening of the scapholunate interval, but otherwise carpal bone alignment appears preserved. Visible metacarpals appear intact, there is foreshortening of the fifth metacarpal. Calcified peripheral vascular disease. IMPRESSION: 1. Comminuted and dorsally impacted distal right radius fracture with radiocarpal and probably also DRU involvement. 2. Comminuted mildly displaced fracture of the right ulnar styloid. 3. Widening of the scapholunate interval suspicious for scapholunate ligament injury. Electronically Signed   By: Genevie Ann M.D.   On: 10/29/2015 14:56   Ct Head Wo Contrast  Result Date: 11/14/2015 CLINICAL DATA:  Auditory hallucinations, flight of ideas and agitation. EXAM: CT HEAD WITHOUT CONTRAST TECHNIQUE: Contiguous axial images were obtained from the base of the skull through the vertex without intravenous contrast. COMPARISON:  10/29/2015 CT FINDINGS: Brain: No evidence of acute large vessel territory infarction, hemorrhage, hydrocephalus, extra-axial collection or mass lesion/mass effect. Superficial and central atrophy with chronic appearing small vessel ischemic disease of periventricular and subcortical white matter. Vascular: No hyperdense vessel or unexpected calcification. Skull: Negative Sinuses/Orbits: Left ocular lens replacement. The visualized paranasal sinuses are clear. Other: Clear mastoids bilaterally. IMPRESSION: Cerebral atrophy with chronic small vessel ischemic disease. No significant change from recent comparison. Electronically Signed   By: [name] Royalty M.D.   On: 11/14/2015 13:28   Ct Head Wo Contrast  Result Date: 10/29/2015 CLINICAL DATA:  Status post fall x3 at home today. The patient reports a blow to the head. Initial encounter. EXAM: CT HEAD WITHOUT CONTRAST CT CERVICAL SPINE WITHOUT CONTRAST TECHNIQUE: Multidetector CT imaging of the  head and cervical spine was performed following the standard protocol without intravenous contrast. Multiplanar CT image reconstructions of the cervical spine were also generated. COMPARISON:  Head and cervical spine CT scan 06/26/2014. FINDINGS: CT HEAD FINDINGS Brain: Cortical atrophy and chronic microvascular ischemic change are identified. No evidence of acute abnormality including hemorrhage, infarct, mass lesion, mass effect, midline shift or abnormal extra-axial fluid collection is identified. No hydrocephalus or pneumocephalus. Vascular: Extensive atherosclerosis is seen. Skull: Intact. Sinuses/Orbits: Unremarkable. Other: None. CT CERVICAL SPINE FINDINGS Alignment: Maintained.  Straightening of lordosis is unchanged. Skull base and vertebrae: Unremarkable. Soft tissues and spinal canal: Unremarkable. Disc levels: Intervertebral disc space height is maintained. Degenerative change at the C1-2 articulation is noted. Upper chest: Emphysematous change in the apices is noted. Other: None. IMPRESSION: No acute abnormality head or cervical spine. Atherosclerosis. Atrophy and chronic microvascular ischemic change. Emphysema. Electronically Signed   By: Inge Rise M.D.   On: 10/29/2015 14:29   Ct Cervical Spine Wo Contrast  Result Date: 10/29/2015 CLINICAL DATA:  Status post fall x3 at home today. The patient reports a blow to the head. Initial encounter. EXAM: CT HEAD WITHOUT CONTRAST CT CERVICAL SPINE WITHOUT CONTRAST TECHNIQUE: Multidetector CT imaging of the head and cervical spine was performed following the standard protocol  without intravenous contrast. Multiplanar CT image reconstructions of the cervical spine were also generated. COMPARISON:  Head and cervical spine CT scan 06/26/2014. FINDINGS: CT HEAD FINDINGS Brain: Cortical atrophy and chronic microvascular ischemic change are identified. No evidence of acute abnormality including hemorrhage, infarct, mass lesion, mass effect, midline shift or  abnormal extra-axial fluid collection is identified. No hydrocephalus or pneumocephalus. Vascular: Extensive atherosclerosis is seen. Skull: Intact. Sinuses/Orbits: Unremarkable. Other: None. CT CERVICAL SPINE FINDINGS Alignment: Maintained.  Straightening of lordosis is unchanged. Skull base and vertebrae: Unremarkable. Soft tissues and spinal canal: Unremarkable. Disc levels: Intervertebral disc space height is maintained. Degenerative change at the C1-2 articulation is noted. Upper chest: Emphysematous change in the apices is noted. Other: None. IMPRESSION: No acute abnormality head or cervical spine. Atherosclerosis. Atrophy and chronic microvascular ischemic change. Emphysema. Electronically Signed   By: Inge Rise M.D.   On: 10/29/2015 14:29   Mr Brain Wo Contrast  Result Date: 11/18/2015 CLINICAL DATA:  Encephalopathy. 3-4 days of worsening altered mental status, increasing agitation. History of atrial fibrillation, bipolar disorder, seizure disorder, Parkinson's, recurrent falls. EXAM: MRI HEAD WITHOUT CONTRAST TECHNIQUE: Multiplanar, multiecho pulse sequences of the brain and surrounding structures were obtained without intravenous contrast. COMPARISON:  CT HEAD November 14, 2015 and MRI of the head October 22, 2014 FINDINGS: Multiple sequences are mildly motion degraded. BRAIN: No reduced diffusion to suggest acute ischemia. No susceptibility artifact to suggest hemorrhage. The ventricles and sulci are normal for patient's age. Old small LEFT cerebellar infarct. Patchy to confluent supratentorial and pontine white matter FLAIR T2 hyperintensities, with superimposed focal cystic changes. No suspicious parenchymal signal, masses or mass effect. No abnormal extra-axial fluid collections. No extra-axial masses though, contrast enhanced sequences would be more sensitive. VASCULAR: Normal major intracranial vascular flow voids present at skull base. SKULL AND UPPER CERVICAL SPINE: No abnormal sellar  expansion. No suspicious calvarial bone marrow signal. Craniocervical junction maintained. SINUSES/ORBITS: The mastoid air-cells and included paranasal sinuses are well-aerated. Status post LEFT ocular lens implant. The included ocular globes and orbital contents are non-suspicious. OTHER: Patient is edentulous. IMPRESSION: No acute intracranial process. Stable examination including moderate chronic small vessel ischemic disease and old LEFT cerebellar small infarct. Electronically Signed   By: Elon Alas M.D.   On: 11/18/2015 01:04   Ct Wrist Right Wo Contrast  Result Date: 11/04/2015 CLINICAL DATA:  Muscular dystrophy. Status post fall. Right wrist pain. EXAM: CT OF THE RIGHT WRIST WITHOUT CONTRAST TECHNIQUE: Multidetector CT imaging of the right wrist was performed according to the standard protocol. Multiplanar CT image reconstructions were also generated. COMPARISON:  None. FINDINGS: Bones/Joint/Cartilage Comminuted fracture of the distal radial metaphysis extending to the epiphysis. 8 mm of distraction of the distal radial articular surface. 10 mm of dorsal displacement of the distal dorsal major fracture fragment. Minimally displaced fracture of the ulnar styloid process. Widening of the scapholunate joint most concerning for scapholunate ligament tear. Generalized osteopenia consistent with disuse. No other fracture or dislocation. 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 Ligaments Suboptimally assessed by CT. Muscles and Tendons Suboptimally evaluated by CT. Soft tissues No fluid collection or hematoma. Generalized soft tissue swelling around the wrist. IMPRESSION: 1. Comminuted fracture of the distal radial metaphysis extending to the epiphysis. 8 mm of distraction of the distal radial articular surface. 10 mm of dorsal displacement of the distal dorsal major fracture fragment. 2. Minimally displaced fracture of the ulnar styloid process. 3. Widening of the scapholunate joint most  concerning for scapholunate ligament  tear. Electronically Signed   By: Kathreen Devoid   On: 11/04/2015 11:27   Dg Chest Port 1 View  Result Date: 11/14/2015 CLINICAL DATA:  Altered mental status. History of hypertension and ischemic heart disease. EXAM: PORTABLE CHEST 1 VIEW COMPARISON:  Chest x-ray 11/02/2015 FINDINGS: The heart is enlarged but stable. Mild tortuosity and calcification of the thoracic aorta is stable. Stable eventration of the right hemidiaphragm with overlying vascular crowding and atelectasis. No infiltrates, edema or effusions. IMPRESSION: Stable cardiac enlargement and elevation of the right hemidiaphragm. No infiltrates or effusions. Electronically Signed   By: Marijo Sanes M.D.   On: 11/14/2015 13:01   Dg Hand Complete Right  Result Date: 10/29/2015 CLINICAL DATA:  65 year old male status post multiple falls at home. Pain. Initial encounter. EXAM: RIGHT HAND - COMPLETE 3+ VIEW COMPARISON:  Right wrist series from today reported separately. FINDINGS: See right wrist series from today reported separately regarding distal radius, ulna, and carpal bone injuries. Foreshortening of the right fifth metacarpal where there may be an underlying healed chronic fracture. No acute metacarpal fracture. No acute phalanx fracture. Distal joint spaces are within normal limits for age. IMPRESSION: 1. See right wrist series from today reported separately. 2. No superimposed acute fracture or dislocation identified in the right hand. Electronically Signed   By: Genevie Ann M.D.   On: 10/29/2015 14:57    Micro Results     Recent Results (from the past 240 hour(s))  Culture, blood (Routine X 2) w Reflex to ID Panel     Status: None (Preliminary result)   Collection Time: 11/14/15 11:59 AM  Result Value Ref Range Status   Specimen Description BLOOD RIGHT ANTECUBITAL  Final   Special Requests BOTTLES DRAWN AEROBIC AND ANAEROBIC 5ML  Final   Culture   Final    NO GROWTH 4 DAYS Performed at [facility]    Report Status PENDING  Incomplete  Urine culture     Status: None   Collection Time: 11/14/15 12:13 PM  Result Value Ref Range Status   Specimen Description URINE, CATHETERIZED  Final   Special Requests NONE  Final   Culture NO GROWTH Performed at [facility]   Final   Report Status 11/15/2015 FINAL  Final  Culture, blood (Routine X 2) w Reflex to ID Panel     Status: None (Preliminary result)   Collection Time: 11/14/15 12:27 PM  Result Value Ref Range Status   Specimen Description BLOOD LEFT ANTECUBITAL  Final   Special Requests BOTTLES DRAWN AEROBIC AND ANAEROBIC 5ML  Final   Culture   Final    NO GROWTH 4 DAYS Performed at [facility]    Report Status PENDING  Incomplete  MRSA PCR Screening     Status: None   Collection Time: 11/14/15  5:15 PM  Result Value Ref Range Status   MRSA by PCR NEGATIVE NEGATIVE Final    Comment:        The GeneXpert MRSA Assay (FDA approved for NASAL specimens only), is one component of a comprehensive MRSA colonization surveillance program. It is not intended to diagnose MRSA infection nor to guide or monitor treatment for MRSA infections.        Today   Subjective:   Chanze Vanegas today has no headache,no chest or abdominal pain,no new weakness tingling or numbness, feels much better wants to go home today.   Objective:   Blood pressure 118/62, pulse 84, temperature 98 F (36.7 C), temperature  source Oral, resp. rate 18, height 5\' 3"  (1.6 m), weight 97.1 kg (214 lb 1.1 oz), SpO2 98 %.   Intake/Output Summary (Last 24 hours) at 11/18/15 1511 Last data filed at 11/18/15 0938  Gross per 24 hour  Intake              480 ml  Output              920 ml  Net             -440 ml    Exam Awake Alert, Oriented x 3 Supple Neck,No JVD, Symmetrical Chest wall movement, Good air movement bilaterally, CTAB RRR,No Gallops,Rubs or new Murmurs, No Parasternal Heave +ve B.Sounds, Abd Soft, Non tender,   No rebound -guarding or rigidity. No Cyanosis, Clubbing or edema, No new Rash or bruise, patient  with right forearm splint  Data Review   CBC w Diff: Lab Results  Component Value Date   WBC 11.5 (H) 11/17/2015   HGB 13.8 11/17/2015   HCT 43.1 11/17/2015   PLT 208 11/17/2015   LYMPHOPCT 18 11/02/2015   MONOPCT 11 11/02/2015   EOSPCT 1 11/02/2015   BASOPCT 0 11/02/2015    CMP: Lab Results  Component Value Date   NA 138 11/17/2015   K 3.9 11/17/2015   CL 107 11/17/2015   CO2 26 11/17/2015   BUN 18 11/17/2015   CREATININE 0.77 11/17/2015   CREATININE 0.97 05/19/2015   PROT 5.2 (L) 11/16/2015   ALBUMIN 2.8 (L) 11/16/2015   BILITOT 1.6 (H) 11/16/2015   ALKPHOS 60 11/16/2015   AST 43 (H) 11/16/2015   ALT 42 11/16/2015  .   Total Time in preparing paper work, data evaluation and todays exam - 35 minutes  Masa Lubin M.D on 11/18/2015 at 3:11 PM  [facility]   Office  [phone]

## 2015-11-19 DIAGNOSIS — M79602 Pain in left arm: Secondary | ICD-10-CM | POA: Diagnosis not present

## 2015-11-19 DIAGNOSIS — Z4789 Encounter for other orthopedic aftercare: Secondary | ICD-10-CM | POA: Diagnosis not present

## 2015-11-19 LAB — CULTURE, BLOOD (ROUTINE X 2)
CULTURE: NO GROWTH
CULTURE: NO GROWTH

## 2015-11-20 DIAGNOSIS — I2510 Atherosclerotic heart disease of native coronary artery without angina pectoris: Secondary | ICD-10-CM | POA: Diagnosis not present

## 2015-11-20 DIAGNOSIS — I482 Chronic atrial fibrillation: Secondary | ICD-10-CM | POA: Diagnosis not present

## 2015-11-20 DIAGNOSIS — I10 Essential (primary) hypertension: Secondary | ICD-10-CM | POA: Diagnosis not present

## 2015-11-21 DIAGNOSIS — M6281 Muscle weakness (generalized): Secondary | ICD-10-CM | POA: Diagnosis not present

## 2015-11-21 DIAGNOSIS — W19XXXA Unspecified fall, initial encounter: Secondary | ICD-10-CM | POA: Diagnosis not present

## 2015-11-25 DIAGNOSIS — Z4789 Encounter for other orthopedic aftercare: Secondary | ICD-10-CM | POA: Diagnosis not present

## 2015-11-26 DIAGNOSIS — M6281 Muscle weakness (generalized): Secondary | ICD-10-CM | POA: Diagnosis not present

## 2015-11-26 DIAGNOSIS — Z4789 Encounter for other orthopedic aftercare: Secondary | ICD-10-CM | POA: Diagnosis not present

## 2015-11-30 ENCOUNTER — Other Ambulatory Visit: Payer: Self-pay | Admitting: Student

## 2015-11-30 DIAGNOSIS — K219 Gastro-esophageal reflux disease without esophagitis: Secondary | ICD-10-CM

## 2015-11-30 IMAGING — CT CT ABD-PELV W/ CM
1 of 3 series · 14 of 32 positions shown, 19 images · IV contrast (omnipaque)
Comparison: CT ABD/PELVIS W CM dated 05/11/2013

CLINICAL DATA: Rectal bleeding and abdominal pain.

EXAM:
CT ABDOMEN AND PELVIS WITH CONTRAST
TECHNIQUE: Multidetector CT imaging of the abdomen and pelvis was performed
using the standard protocol following bolus administration of
intravenous contrast.
CONTRAST:  100mL OMNIPAQUE IOHEXOL 300 MG/ML  SOLN

[Series 2: abd/pel with · axial · 0.86mm/px · z∈[+1178,+1553]mm · 14 of 85 slices shown, 19 images]
[im 5/85  soft-tissue]
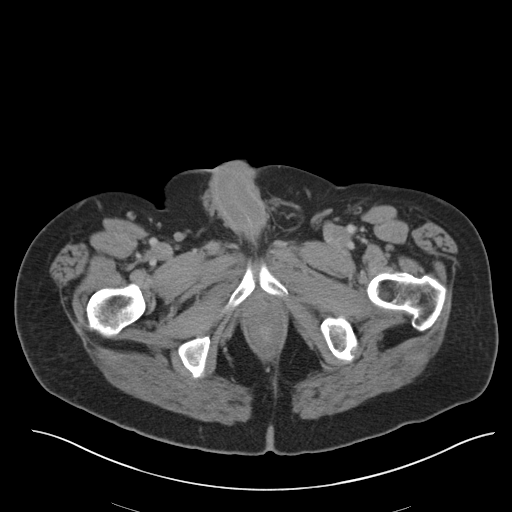
[im 5/85  bone]
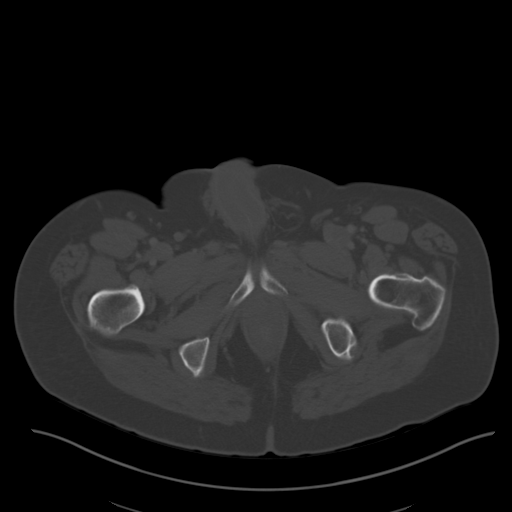
[im 13/85  soft-tissue]
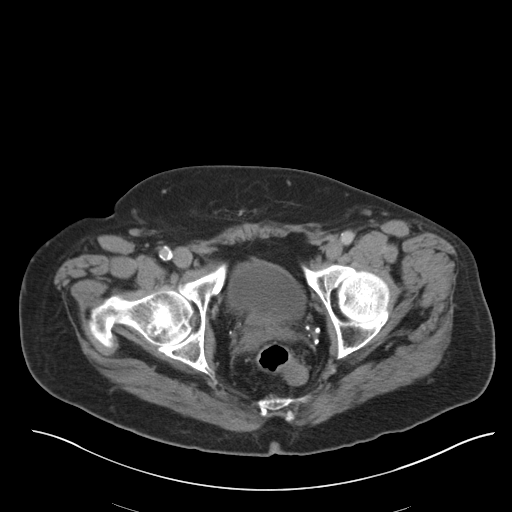
[im 17/85  soft-tissue]
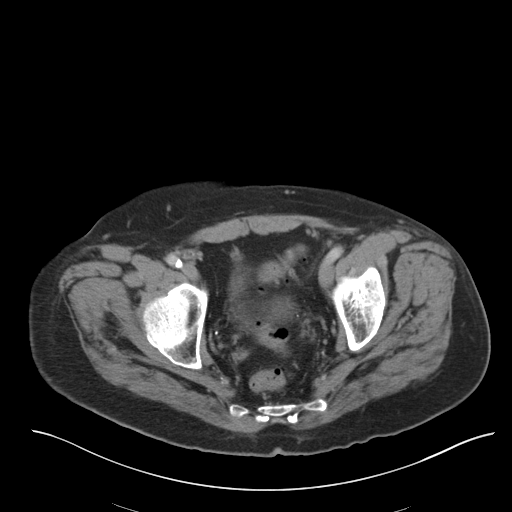
[im 26/85  soft-tissue]
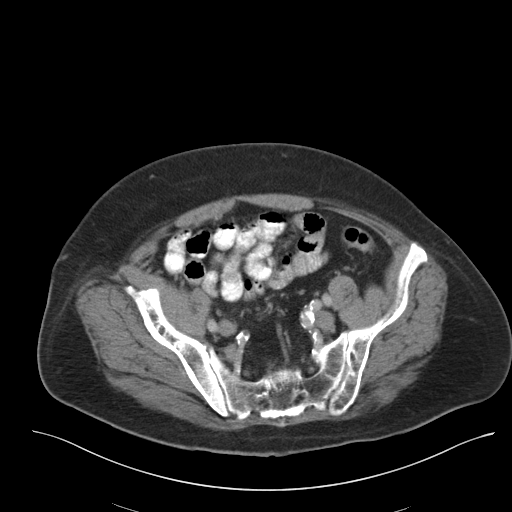
[im 30/85  soft-tissue]
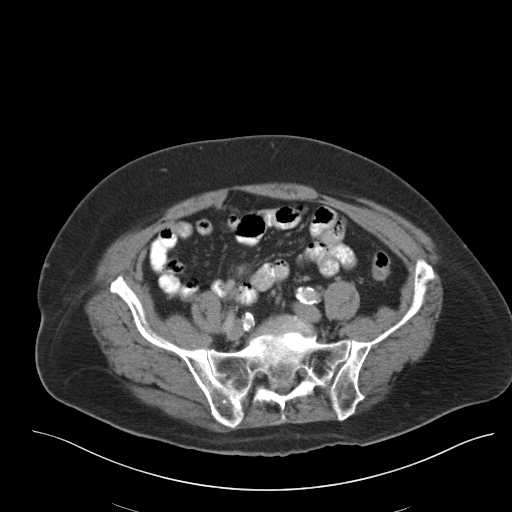
[im 38/85  soft-tissue]
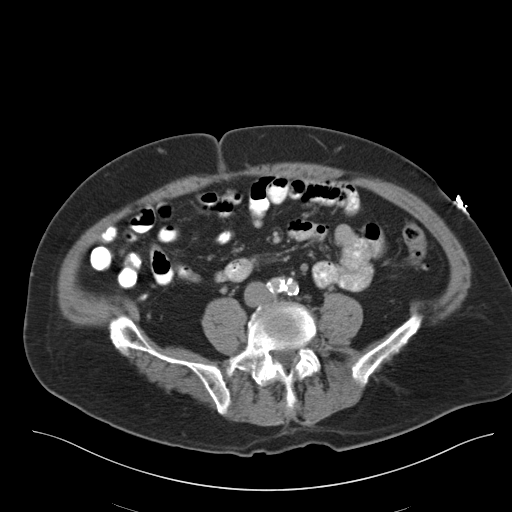
[im 43/85  soft-tissue]
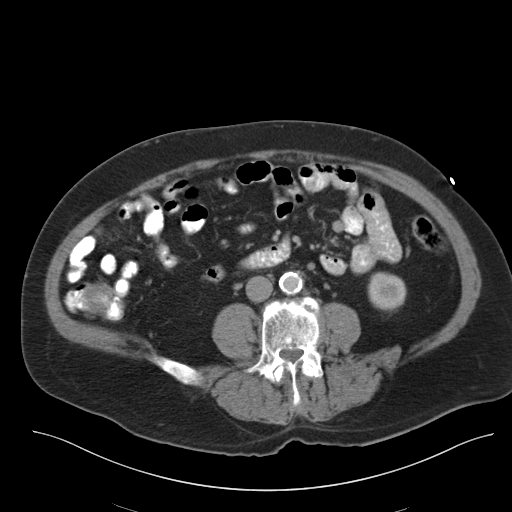
[im 47/85  soft-tissue]
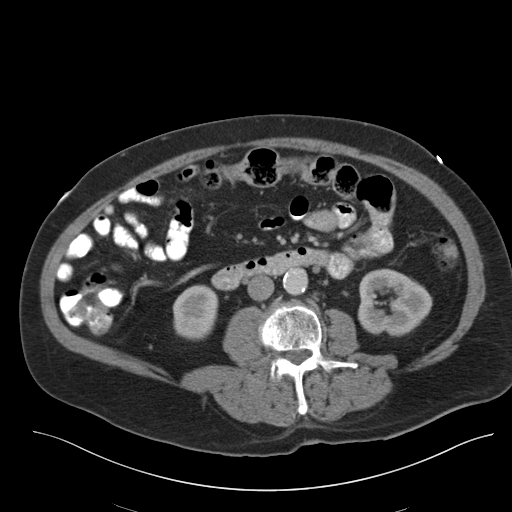
[im 55/85  soft-tissue]
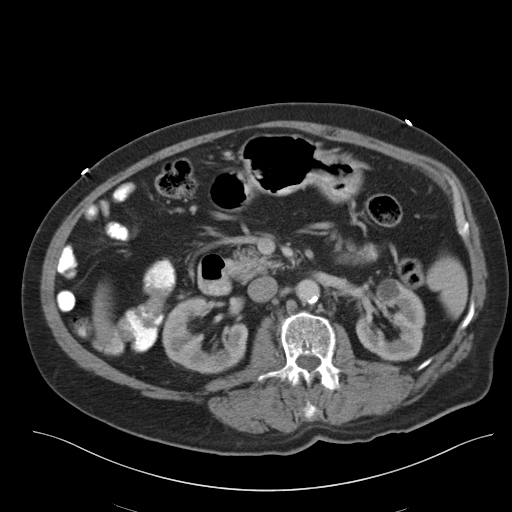
[im 55/85  bone]
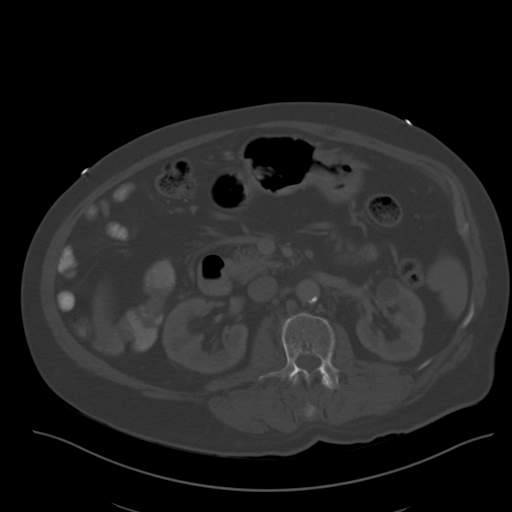
[im 59/85  soft-tissue]
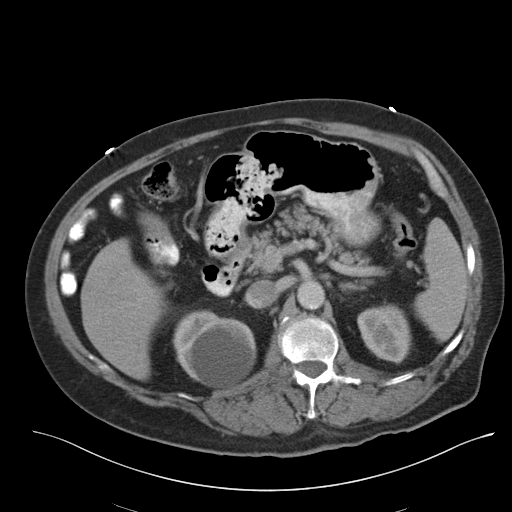
[im 68/85  soft-tissue]
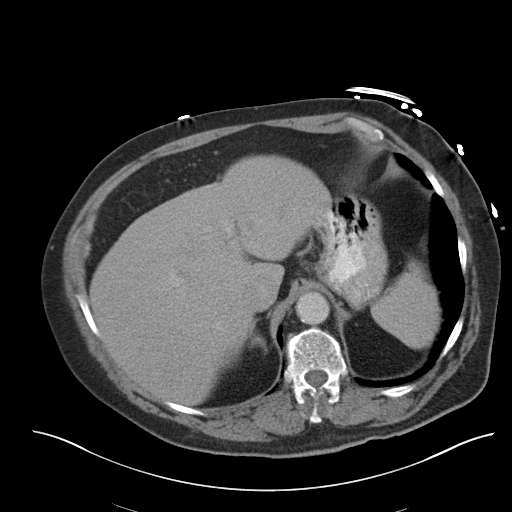
[im 68/85  lung]
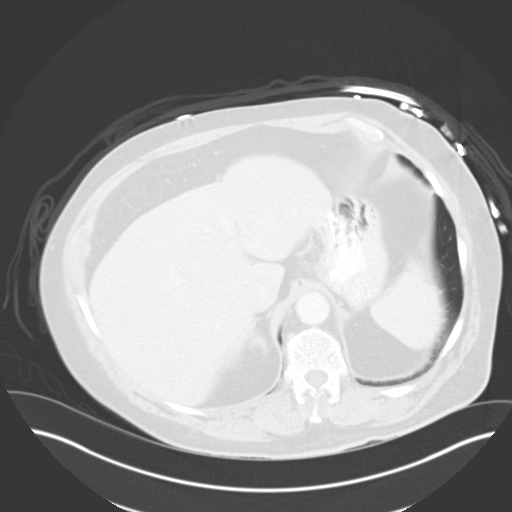
[im 72/85  soft-tissue]
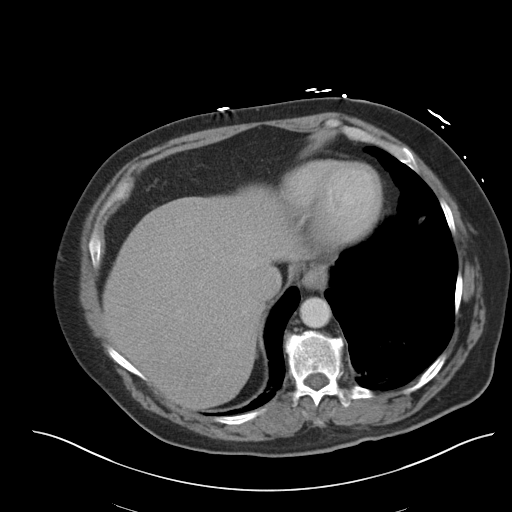
[im 72/85  lung]
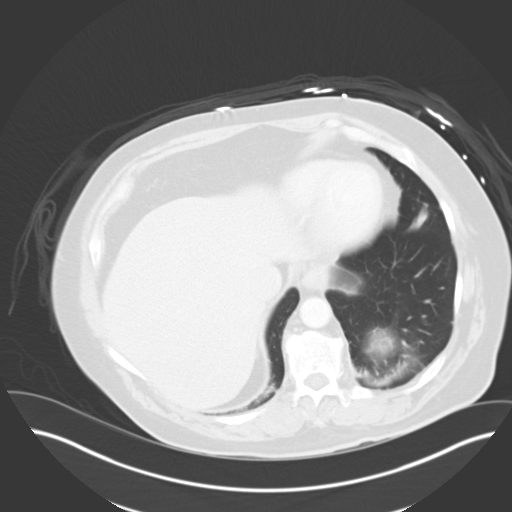
[im 76/85  lung]
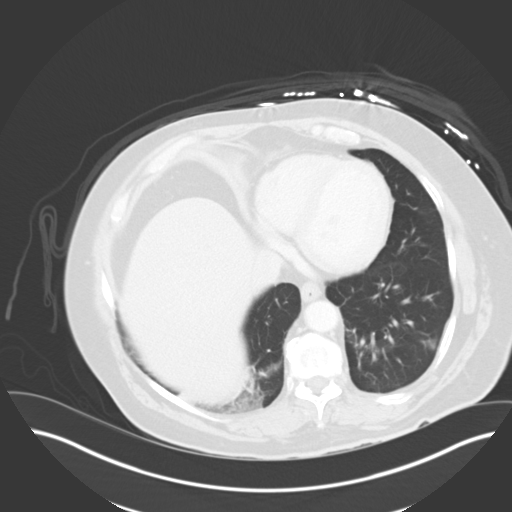
[im 80/85  soft-tissue]
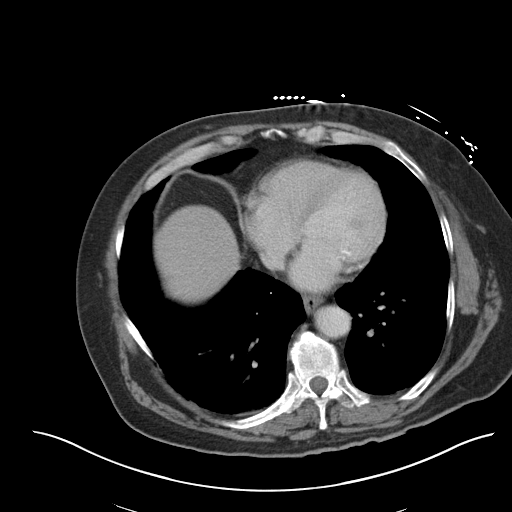
[im 80/85  lung]
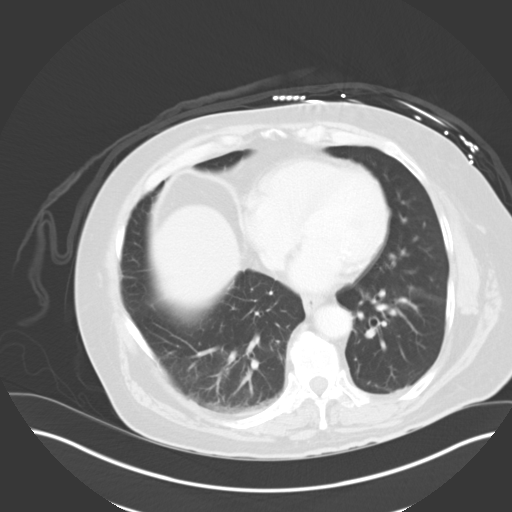

[14 of 32 positions shown; findings below may reference images not displayed]

FINDINGS: Bibasilar atelectasis.

Gallbladder is decompressed

Liver, spleen, pancreas, and adrenal glands are within normal limits

Stable renal hypodensities and cysts.

Normal appendix.

Sigmoid diverticulosis without evidence of definitive acute
diverticulitis. Sigmoid colon is decompressed.

Bladder and prostate are unremarkable.

Unilateral left pars defect at L5.
IMPRESSION: No acute intra-abdominal or intrapelvic process.

Diverticulosis without definitive evidence of acute diverticulitis

## 2015-12-01 ENCOUNTER — Telehealth: Payer: Self-pay | Admitting: *Deleted

## 2015-12-01 DIAGNOSIS — S52571D Other intraarticular fracture of lower end of right radius, subsequent encounter for closed fracture with routine healing: Secondary | ICD-10-CM | POA: Diagnosis not present

## 2015-12-01 DIAGNOSIS — S52571A Other intraarticular fracture of lower end of right radius, initial encounter for closed fracture: Secondary | ICD-10-CM | POA: Diagnosis not present

## 2015-12-01 NOTE — Telephone Encounter (Signed)
Patient has significant wrist fracture that needs surgery. Dr Burney Gauze would like to speak with PCP regarding patients mental capacities

## 2015-12-02 ENCOUNTER — Institutional Professional Consult (permissible substitution): Payer: Self-pay | Admitting: Pulmonary Disease

## 2015-12-02 DIAGNOSIS — N401 Enlarged prostate with lower urinary tract symptoms: Secondary | ICD-10-CM | POA: Diagnosis not present

## 2015-12-02 DIAGNOSIS — Z7982 Long term (current) use of aspirin: Secondary | ICD-10-CM | POA: Diagnosis not present

## 2015-12-02 DIAGNOSIS — Z9181 History of falling: Secondary | ICD-10-CM | POA: Diagnosis not present

## 2015-12-02 DIAGNOSIS — S52501D Unspecified fracture of the lower end of right radius, subsequent encounter for closed fracture with routine healing: Secondary | ICD-10-CM | POA: Diagnosis not present

## 2015-12-02 DIAGNOSIS — I482 Chronic atrial fibrillation: Secondary | ICD-10-CM | POA: Diagnosis not present

## 2015-12-02 DIAGNOSIS — R260 Ataxic gait: Secondary | ICD-10-CM | POA: Diagnosis not present

## 2015-12-02 DIAGNOSIS — H8110 Benign paroxysmal vertigo, unspecified ear: Secondary | ICD-10-CM | POA: Diagnosis not present

## 2015-12-02 DIAGNOSIS — I2510 Atherosclerotic heart disease of native coronary artery without angina pectoris: Secondary | ICD-10-CM | POA: Diagnosis not present

## 2015-12-02 DIAGNOSIS — I10 Essential (primary) hypertension: Secondary | ICD-10-CM | POA: Diagnosis not present

## 2015-12-02 DIAGNOSIS — S2241XD Multiple fractures of ribs, right side, subsequent encounter for fracture with routine healing: Secondary | ICD-10-CM | POA: Diagnosis not present

## 2015-12-02 DIAGNOSIS — G20 Parkinson's disease: Secondary | ICD-10-CM | POA: Diagnosis not present

## 2015-12-02 DIAGNOSIS — W19XXXD Unspecified fall, subsequent encounter: Secondary | ICD-10-CM | POA: Diagnosis not present

## 2015-12-02 DIAGNOSIS — J449 Chronic obstructive pulmonary disease, unspecified: Secondary | ICD-10-CM | POA: Diagnosis not present

## 2015-12-03 DIAGNOSIS — R260 Ataxic gait: Secondary | ICD-10-CM | POA: Diagnosis not present

## 2015-12-03 DIAGNOSIS — I482 Chronic atrial fibrillation: Secondary | ICD-10-CM | POA: Diagnosis not present

## 2015-12-03 DIAGNOSIS — N401 Enlarged prostate with lower urinary tract symptoms: Secondary | ICD-10-CM | POA: Diagnosis not present

## 2015-12-03 DIAGNOSIS — S52501D Unspecified fracture of the lower end of right radius, subsequent encounter for closed fracture with routine healing: Secondary | ICD-10-CM | POA: Diagnosis not present

## 2015-12-03 DIAGNOSIS — J449 Chronic obstructive pulmonary disease, unspecified: Secondary | ICD-10-CM | POA: Diagnosis not present

## 2015-12-03 DIAGNOSIS — I10 Essential (primary) hypertension: Secondary | ICD-10-CM | POA: Diagnosis not present

## 2015-12-03 DIAGNOSIS — H8110 Benign paroxysmal vertigo, unspecified ear: Secondary | ICD-10-CM | POA: Diagnosis not present

## 2015-12-03 DIAGNOSIS — S2241XD Multiple fractures of ribs, right side, subsequent encounter for fracture with routine healing: Secondary | ICD-10-CM | POA: Diagnosis not present

## 2015-12-03 DIAGNOSIS — I2510 Atherosclerotic heart disease of native coronary artery without angina pectoris: Secondary | ICD-10-CM | POA: Diagnosis not present

## 2015-12-03 DIAGNOSIS — W19XXXD Unspecified fall, subsequent encounter: Secondary | ICD-10-CM | POA: Diagnosis not present

## 2015-12-03 DIAGNOSIS — Z7982 Long term (current) use of aspirin: Secondary | ICD-10-CM | POA: Diagnosis not present

## 2015-12-03 DIAGNOSIS — G20 Parkinson's disease: Secondary | ICD-10-CM | POA: Diagnosis not present

## 2015-12-03 DIAGNOSIS — Z9181 History of falling: Secondary | ICD-10-CM | POA: Diagnosis not present

## 2015-12-03 NOTE — Telephone Encounter (Signed)
Called and talked to [place]. Dr. Burney Gauze was in [place]. Mack Guise my pager in case Dr. Burney Gauze need to talk to me.  Patient was evaluated by psychiatrist on 11/18/2015 was deemed to have no capacity to make medical decisions. I routed the note by psychiatrist to Dr. Burney Gauze.

## 2015-12-09 NOTE — Progress Notes (Deleted)
Patrick Holt was seen today in the movement disorders clinic for neurologic consultation at the request of [name], MD.  The consultation is for the evaluation of "Parkinsons disease."  The records that were made available to me were reviewed.  Pt is a 65 y.o. with a hx of schizoaffective d/o and benzodiazepine abuse, and is on risperdal for this.  Seen by psychiatry as an in patient on 11/18/15 and deemed to be incompetant to make medical decisions and decisions for living arrangements.   Also has a hx of a-fib and is maintained on amiodarone.  Has a hx of seizure d/o on keppra and hx of SDH after fall.  He was  The first symptom(s) the patient noticed was {parkinsons general sx:18033} and this was {NUMBERS;0-15 BY 1:408015} {days/wks/mos/yrs:310907}.    Specific Symptoms:  Tremor: {yes no:314532} Family hx of similar:  {yes no:314532} Voice: *** Sleep: ***  Vivid Dreams:  {yes no:314532}  Acting out dreams:  {yes no:314532} Wet Pillows: {yes no:314532} Postural symptoms:  {yes no:314532}  Falls?  {yes no:314532} Bradykinesia symptoms: {parkinson brady:18041} Loss of smell:  {yes no:314532} Loss of taste:  {yes no:314532} Urinary Incontinence:  {yes no:314532} Difficulty Swallowing:  {yes no:314532} Handwriting, micrographia: {yes no:314532} Trouble with ADL's:  {yes no:314532}  Trouble buttoning clothing: {yes no:314532} Depression:  {yes no:314532} Memory changes:  {yes no:314532} Hallucinations:  {yes no:314532}  visual distortions: {yes no:314532} N/V:  {yes no:314532} Lightheaded:  {yes no:314532}  Syncope: {yes no:314532} Diplopia:  {yes no:314532} Dyskinesia:  {yes no:314532}  Neuroimaging has *** previously been performed.   I reviewed this.  It was done on 11/17/2015.  There was at least moderate white matter disease.  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  There was mild to moderate atrophy.  PREVIOUS MEDICATIONS: {Parkinson's RX:18200}  ALLERGIES:     Allergies  Allergen Reactions  . Immune Globulins Other (See Comments)    Acute renal failure  . Ciprofloxacin Swelling  . Flagyl [Metronidazole] Swelling  . Lisinopril Diarrhea  . Sulfa Antibiotics Other (See Comments)    blisters  . Requip [Ropinirole Hcl]     constipation  . Betamethasone Dipropionate Other (See Comments)    Unknown  . Bupropion Hcl Other (See Comments)    Unknown  . Clobetasol Other (See Comments)    Unknown  . Codeine Other (See Comments)    Unknown  . Escitalopram Oxalate Other (See Comments)    Unknown  . Fluoxetine Hcl Other (See Comments)    Unknown  . Fluticasone-Salmeterol Other (See Comments)    Unknown  . Furosemide Other (See Comments)    Unknown  . Paroxetine Other (See Comments)    Unknown  . Penicillins Other (See Comments)    Unknown reaction Has patient had a PCN reaction causing immediate rash, facial/tongue/throat swelling, SOB or lightheadedness with hypotension: unknown Has patient had a PCN reaction causing severe rash involving mucus membranes or skin necrosis: unknown Has patient had a PCN reaction that required hospitalization unknown Has patient had a PCN reaction occurring within the last 10 years: unknown If all of the above answers are "NO", then may proceed with Cephalosporin use.   . Tacrolimus Other (See Comments)    Unknown  . Tetanus Toxoid Other (See Comments)    Unknown  . Tuberculin Purified Protein Derivative Other (See Comments)    Unknown    CURRENT MEDICATIONS:  Outpatient Encounter Prescriptions as of 12/11/2015  Medication Sig  . amiodarone (PACERONE) 200  MG tablet Take 0.5 tablets (100 mg total) by mouth daily.  Marland Kitchen amLODipine (NORVASC) 5 MG tablet Take 1 tablet (5 mg total) by mouth daily.  Marland Kitchen aspirin EC 81 MG tablet Take 1 tablet (81 mg total) by mouth daily.  Marland Kitchen atorvastatin (LIPITOR) 40 MG tablet Take 1 tablet (40 mg total) by mouth daily. (Patient taking differently: Take 40 mg by mouth at bedtime. )   . clonazePAM (KLONOPIN) 0.5 MG tablet Take 1 tablet (0.5 mg total) by mouth daily.  . cyanocobalamin (,VITAMIN B-12,) 1000 MCG/ML injection Please give IM once daily for 3 days, then once weekly for 4 weeks, then once monthly thereafter  . divalproex (DEPAKOTE ER) 250 MG 24 hr tablet Take 3 tablets (750 mg total) by mouth daily.  . folic acid (FOLVITE) 1 MG tablet Take 2 tablets (2 mg total) by mouth daily.  Marland Kitchen guaiFENesin (MUCINEX) 600 MG 12 hr tablet Take 1 tablet (600 mg total) by mouth 2 (two) times daily. OTC  . ibuprofen (ADVIL,MOTRIN) 400 MG tablet Take 0.5 tablets (200 mg total) by mouth every 8 (eight) hours as needed for moderate pain.  Marland Kitchen ipratropium-albuterol (DUONEB) 0.5-2.5 (3) MG/3ML SOLN Inhale 3 mLs into the lungs every 6 (six) hours as needed.  . lactulose (CHRONULAC) 10 GM/15ML solution Take 30 mLs (20 g total) by mouth 2 (two) times daily. Please titrate for 2-3 soft bowel movements per day  . levETIRAcetam (KEPPRA) 500 MG tablet TAKE 1 TABLET(500 MG) BY MOUTH TWICE DAILY  . loperamide (IMODIUM) 2 MG capsule TAKE 1 TO 2 CAPSULES BY MOUTH 3 TIMES A DAY AS NEEDED FOR DIARRHEA (Patient taking differently: Take 2 mg by mouth every 8 (eight) hours as needed for diarrhea or loose stools. )  . meclizine (ANTIVERT) 25 MG tablet TAKE 1 TABLET (25 MG TOTAL) BY MOUTH 3 (THREE) TIMES DAILY AS NEEDED. (Patient taking differently: Take 25 mg by mouth every 8 (eight) hours as needed for dizziness. TAKE 1 TABLET (25 MG TOTAL) BY MOUTH 3 (THREE) TIMES DAILY AS NEEDED.)  . Multiple Vitamin (MULTIVITAMIN WITH MINERALS) TABS tablet Take 1 tablet by mouth daily.  Marland Kitchen omeprazole (PRILOSEC) 20 MG capsule Take 20 mg by mouth daily.  . Potassium Chloride ER 20 MEQ TBCR Take 1 tablet by mouth every other day.   . risperiDONE (RISPERDAL) 1 MG tablet Take 1 tablet (1 mg total) by mouth 2 (two) times daily.  Marland Kitchen senna-docusate (SENOKOT-S) 8.6-50 MG tablet Take 1 tablet by mouth at bedtime as needed for mild  constipation.   No facility-administered encounter medications on file as of 12/11/2015.     PAST MEDICAL HISTORY:   Past Medical History:  Diagnosis Date  . Anemia   . Anxiety   . Asthma   . Atrial fibrillation ([place]) 01/2012   with RVR  . Bipolar 1 disorder ([place])   . Cancer ([place])   . Collagen vascular disease ([place])   . Conversion disorder 06/2010  . COPD (chronic obstructive pulmonary disease) ([place])   . Dermatomyositis ([place])   . Dermatomyositis ([place])   . Diverticulitis   . Diverticulosis of colon   . Dysrhythmia    "irregular" (11/15/2012)  . Esophageal dysmotility   . Esophageal stricture   . Fibromyalgia   . Gastritis   . GERD (gastroesophageal reflux disease)   . Headache(784.0)    "severe; get them often" (11/15/2012)  . Hiatal hernia   . History of narcotic addiction ([place])   . Hx of adenomatous colonic polyps   .  Hyperlipidemia   . Hypertension   . Internal hemorrhoids   . Ischemic heart disease   . Major depression    with acute psychotic break in 06/2010  . Myocardial infarct    mulitple (1999, 2000, 2004)  . Narcotic dependence ([place])   . Nephrolithiasis   . Obesity   . OCD (obsessive compulsive disorder)   . Otosclerosis   . Paroxysmal a-fib ([place])   . Peripheral neuropathy ([place])   . Raynaud's disease   . Renal insufficiency   . Rheumatoid arthritis(714.0)   . Sarcoidosis ([place])   . Seizures ([place])   . Subarachnoid hemorrhage ([place]) 01/2012   with subdural  hematoma.   . Type II diabetes mellitus ([place])   . Urge incontinence   . Vertigo     PAST SURGICAL HISTORY:   Past Surgical History:  Procedure Laterality Date  . BACK SURGERY    . CARDIAC CATHETERIZATION    . CARPAL TUNNEL RELEASE Bilateral   . CATARACT EXTRACTION W/ INTRAOCULAR LENS IMPLANT Left   . COLONOSCOPY N/A 09/27/2012   Procedure: COLONOSCOPY;  Surgeon: Lafayette Dragon, MD;  Location: [facility] ENDOSCOPY;  Service: Endoscopy;  Laterality: N/A;  . ESOPHAGOGASTRODUODENOSCOPY N/A 09/27/2012    Procedure: ESOPHAGOGASTRODUODENOSCOPY (EGD);  Surgeon: Lafayette Dragon, MD;  Location: [facility];  Service: Endoscopy;  Laterality: N/A;  . KNEE ARTHROSCOPY W/ MENISCAL REPAIR Left 2009  . LUMBAR DISC SURGERY    . LYMPH NODE DISSECTION Right 1970's   "neck; dr thought I had Hodgkins; turned out to be sarcoidosis" (11/15/2012)  . squamous papilloma   2010   removed by Dr. Constance Holster ENT, noted on tongue  . TONSILLECTOMY      SOCIAL HISTORY:   Social History   Social History  . Marital status: Divorced    Spouse name: N/A  . Number of children: N/A  . Years of education: N/A   Occupational History  . disabled    Social History Main Topics  . Smoking status: Former Smoker    Packs/day: 0.50    Years: 30.00    Types: Cigarettes    Quit date: 12/15/2014  . Smokeless tobacco: Never Used     Comment: 3 cigs a day  . Alcohol use No     Comment: Alcohol stopped in September of 2014  . Drug use: No  . Sexual activity: No   Other Topics Concern  . Not on file   Social History Narrative   The patient was born and raised by both his biological parents in the [place] area. His parents did separate in the past but never divorced. He denies any history of any physical or sexual abuse. The patient says he dropped out of school in the 12th grade. 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 He has never been married and has no children. He currently lives alone in a duplex in [place].    FAMILY HISTORY:   Family Status  Relation Status  . Mother Deceased at age 41   Brain tumor  . Father Deceased  . Other   . Brother     ROS:  A complete 10 system review of systems was obtained and was unremarkable apart from what is mentioned above.  PHYSICAL EXAMINATION:    VITALS:  There were no vitals filed for this visit.  GEN:  The patient appears stated age and is in NAD. HEENT:  Normocephalic, atraumatic.  The mucous membranes are moist. The superficial temporal arteries are without ropiness or tenderness. CV:  RRR Lungs:  CTAB Neck/HEME:  There are no carotid bruits bilaterally.  Neurological examination:  Orientation: The patient is alert and oriented x3. Fund of knowledge is appropriate.  Recent and remote memory are intact.  Attention and concentration are normal.    Able to name objects and repeat phrases. Cranial nerves: There is good facial symmetry. Pupils are equal round and reactive to light bilaterally. Fundoscopic exam reveals clear margins bilaterally. Extraocular muscles are intact. The visual fields are full to confrontational testing. The speech is fluent and clear. Soft palate rises symmetrically and there is no tongue deviation. Hearing is intact to conversational tone. Sensation: Sensation is intact to light and pinprick throughout (facial, trunk, extremities). Vibration is intact at the bilateral big toe. There is no extinction with double simultaneous stimulation. There is no sensory dermatomal level identified. Motor: Strength is 5/5 in the bilateral upper and lower extremities.   Shoulder shrug is equal and symmetric.  There is no pronator drift. Deep tendon reflexes: Deep tendon reflexes are 2/4 at the bilateral biceps, triceps, brachioradialis, patella and achilles. Plantar responses are downgoing bilaterally.  Movement examination: Tone: There is ***tone in the bilateral upper extremities.  The tone in the lower extremities is ***.  Abnormal movements: *** Coordination:  There is *** decremation with RAM's, *** Gait and Station: The patient has *** difficulty arising out of a deep-seated chair without the use of the hands. The patient's stride length is ***.  The patient has a *** pull test.      ASSESSMENT/PLAN:  ***Parkinsonism  -I had a long counseling session with the patient today.  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   I explained that one clinically cannot tell the difference between idiopathic parkinsons disease.  I also explained that even if one is able to get off of the medication, it can take up to 6 months to clinically definitively know if this is idiopathic parkinsons disease.  I did not advise that the patient go off of medication, as this needs to be discussed with the patients prescribing physician.  I did, however, tell the patient that the longer one is on the medication, the worse the symptoms can get.  The patient is to make an appointment with *** to discuss what I have discussed with him.   -I did tell the patient that, while more rare, amiodarone can contrbute to parkinsonism as well.  Doubt that this is big factor here but should be mentioned.     Cc:  [name], MD

## 2015-12-11 ENCOUNTER — Other Ambulatory Visit: Payer: Self-pay | Admitting: Student

## 2015-12-11 ENCOUNTER — Ambulatory Visit: Payer: Self-pay | Admitting: Student

## 2015-12-11 ENCOUNTER — Ambulatory Visit: Payer: Self-pay | Admitting: Neurology

## 2015-12-11 ENCOUNTER — Telehealth: Payer: Self-pay | Admitting: Student

## 2015-12-11 DIAGNOSIS — H8110 Benign paroxysmal vertigo, unspecified ear: Secondary | ICD-10-CM | POA: Diagnosis not present

## 2015-12-11 DIAGNOSIS — W19XXXD Unspecified fall, subsequent encounter: Secondary | ICD-10-CM | POA: Diagnosis not present

## 2015-12-11 DIAGNOSIS — S52501D Unspecified fracture of the lower end of right radius, subsequent encounter for closed fracture with routine healing: Secondary | ICD-10-CM | POA: Diagnosis not present

## 2015-12-11 DIAGNOSIS — Z9181 History of falling: Secondary | ICD-10-CM | POA: Diagnosis not present

## 2015-12-11 DIAGNOSIS — I2510 Atherosclerotic heart disease of native coronary artery without angina pectoris: Secondary | ICD-10-CM | POA: Diagnosis not present

## 2015-12-11 DIAGNOSIS — G20 Parkinson's disease: Secondary | ICD-10-CM | POA: Diagnosis not present

## 2015-12-11 DIAGNOSIS — I482 Chronic atrial fibrillation: Secondary | ICD-10-CM | POA: Diagnosis not present

## 2015-12-11 DIAGNOSIS — Z7982 Long term (current) use of aspirin: Secondary | ICD-10-CM | POA: Diagnosis not present

## 2015-12-11 DIAGNOSIS — R260 Ataxic gait: Secondary | ICD-10-CM | POA: Diagnosis not present

## 2015-12-11 DIAGNOSIS — I10 Essential (primary) hypertension: Secondary | ICD-10-CM | POA: Diagnosis not present

## 2015-12-11 DIAGNOSIS — J449 Chronic obstructive pulmonary disease, unspecified: Secondary | ICD-10-CM | POA: Diagnosis not present

## 2015-12-11 DIAGNOSIS — S2241XD Multiple fractures of ribs, right side, subsequent encounter for fracture with routine healing: Secondary | ICD-10-CM | POA: Diagnosis not present

## 2015-12-11 DIAGNOSIS — N401 Enlarged prostate with lower urinary tract symptoms: Secondary | ICD-10-CM | POA: Diagnosis not present

## 2015-12-11 NOTE — Telephone Encounter (Signed)
Pt would like to talk to dr neal.  He says the people coming out to take care of him are very rude.

## 2015-12-11 NOTE — Telephone Encounter (Signed)
Morey Hummingbird says pt's pain is 10 out of 10 for his back pain. She would like to get a Rx for ibuprofen, 2 tablets every 4 hours. Morey Hummingbird also states pt has been diagnosed bi-polar and schizophrenic and is not on medication for either one. Please advise. Thanks! ep

## 2015-12-12 ENCOUNTER — Other Ambulatory Visit: Payer: Self-pay | Admitting: Student

## 2015-12-12 DIAGNOSIS — Z7982 Long term (current) use of aspirin: Secondary | ICD-10-CM | POA: Diagnosis not present

## 2015-12-12 DIAGNOSIS — H8110 Benign paroxysmal vertigo, unspecified ear: Secondary | ICD-10-CM | POA: Diagnosis not present

## 2015-12-12 DIAGNOSIS — R260 Ataxic gait: Secondary | ICD-10-CM | POA: Diagnosis not present

## 2015-12-12 DIAGNOSIS — I10 Essential (primary) hypertension: Secondary | ICD-10-CM | POA: Diagnosis not present

## 2015-12-12 DIAGNOSIS — Z9181 History of falling: Secondary | ICD-10-CM | POA: Diagnosis not present

## 2015-12-12 DIAGNOSIS — W19XXXD Unspecified fall, subsequent encounter: Secondary | ICD-10-CM | POA: Diagnosis not present

## 2015-12-12 DIAGNOSIS — G20 Parkinson's disease: Secondary | ICD-10-CM | POA: Diagnosis not present

## 2015-12-12 DIAGNOSIS — J449 Chronic obstructive pulmonary disease, unspecified: Secondary | ICD-10-CM | POA: Diagnosis not present

## 2015-12-12 DIAGNOSIS — S2241XD Multiple fractures of ribs, right side, subsequent encounter for fracture with routine healing: Secondary | ICD-10-CM | POA: Diagnosis not present

## 2015-12-12 DIAGNOSIS — S52501D Unspecified fracture of the lower end of right radius, subsequent encounter for closed fracture with routine healing: Secondary | ICD-10-CM | POA: Diagnosis not present

## 2015-12-12 DIAGNOSIS — I482 Chronic atrial fibrillation: Secondary | ICD-10-CM | POA: Diagnosis not present

## 2015-12-12 DIAGNOSIS — I2510 Atherosclerotic heart disease of native coronary artery without angina pectoris: Secondary | ICD-10-CM | POA: Diagnosis not present

## 2015-12-12 DIAGNOSIS — N401 Enlarged prostate with lower urinary tract symptoms: Secondary | ICD-10-CM | POA: Diagnosis not present

## 2015-12-12 MED ORDER — IBUPROFEN 400 MG PO TABS: 200.0000 mg | ORAL_TABLET | Freq: Three times a day (TID) | ORAL | 0 refills | Status: DC | PRN

## 2015-12-12 NOTE — Telephone Encounter (Signed)
Dear Dema Severin Team 1.  I am sorry, there is nothing I can do about his Home health nurses being rude.   2.  His PCP is Dr. Cyndia Skeeters, not me, so if he needs follow up he should direct all enquiries to D. Gonfa. My name is on his [place] orders because it requires an Attendings name. Please let hjim know this as well Thanks you! Dorcas Mcmurray

## 2015-12-12 NOTE — Telephone Encounter (Signed)
Pt called and wanted to speak with Dr. Nori Riis. Please advise. Thanks! ep

## 2015-12-12 NOTE — Telephone Encounter (Signed)
Refilled his ibuprofen. I also recommend office visit for evaluation for his back pain and psychiatric issue.  Patient is on risperidone and divalproex for bipolar disorder/schizophrenia. Last prescription was on 11/19/2015 for both medications. He may benefit from referral to psychiatry/[facility]. So I recommend he comes in and get evaluated for this

## 2015-12-12 NOTE — Telephone Encounter (Signed)
Pt called again wanting to speak with Dr. Nori Riis regarding in home health nurses being rude. Pt states he will not let them back in his home. Please advise. Thanks! ep

## 2015-12-12 NOTE — Telephone Encounter (Signed)
Pt called again wanting to speak with Dr. Nori Riis. Please advise. Thanks! ep

## 2015-12-12 NOTE — Telephone Encounter (Signed)
Contacted Patrick Holt at [facility] and gave her the information below. I am sending/faxing [phone]) her a current medication list, however in reviewing the current medications Patrick Holt did not have all of them on her list and she had some that were on her list that were not on ours.  The one she did not show on hers was the Risperdal.  On her list she had Nexium 40mg  1 cap daily, Oxybutin 5mg  1 tab 4 times a day, Xanex 0.5 mg 1 tab as needed 3x a day, these were not on our list. Also in looking at the Depakote and Risperdal on our list it doesn't look like those are refillable medicines, it looks like they were given in the hospital maybe as a one time only.  Patrick Holt is meeting with patient to review his medications and may be calling to have some refills, also after she gets his medications straightened out she is going to work on getting the patient in for an office visit, she said that he will have to come here for his visits or he may be discharged. Please review the medications and see if there is anything that we may need to let her know. If you need to contact her she can be reached at [place], Patrick Holt D, [place]

## 2015-12-15 ENCOUNTER — Telehealth: Payer: Self-pay | Admitting: Student

## 2015-12-15 ENCOUNTER — Other Ambulatory Visit: Payer: Self-pay | Admitting: Student

## 2015-12-15 DIAGNOSIS — F23 Brief psychotic disorder: Secondary | ICD-10-CM

## 2015-12-15 DIAGNOSIS — F259 Schizoaffective disorder, unspecified: Secondary | ICD-10-CM

## 2015-12-15 IMAGING — CR DG ANKLE COMPLETE 3+V*R*
3 series · 3 of 3 positions shown · non-contrast
Comparison: None.

CLINICAL DATA: Right ankle pain post injury

EXAM:
RIGHT ANKLE - COMPLETE 3+ VIEW

[x ankle ap right]
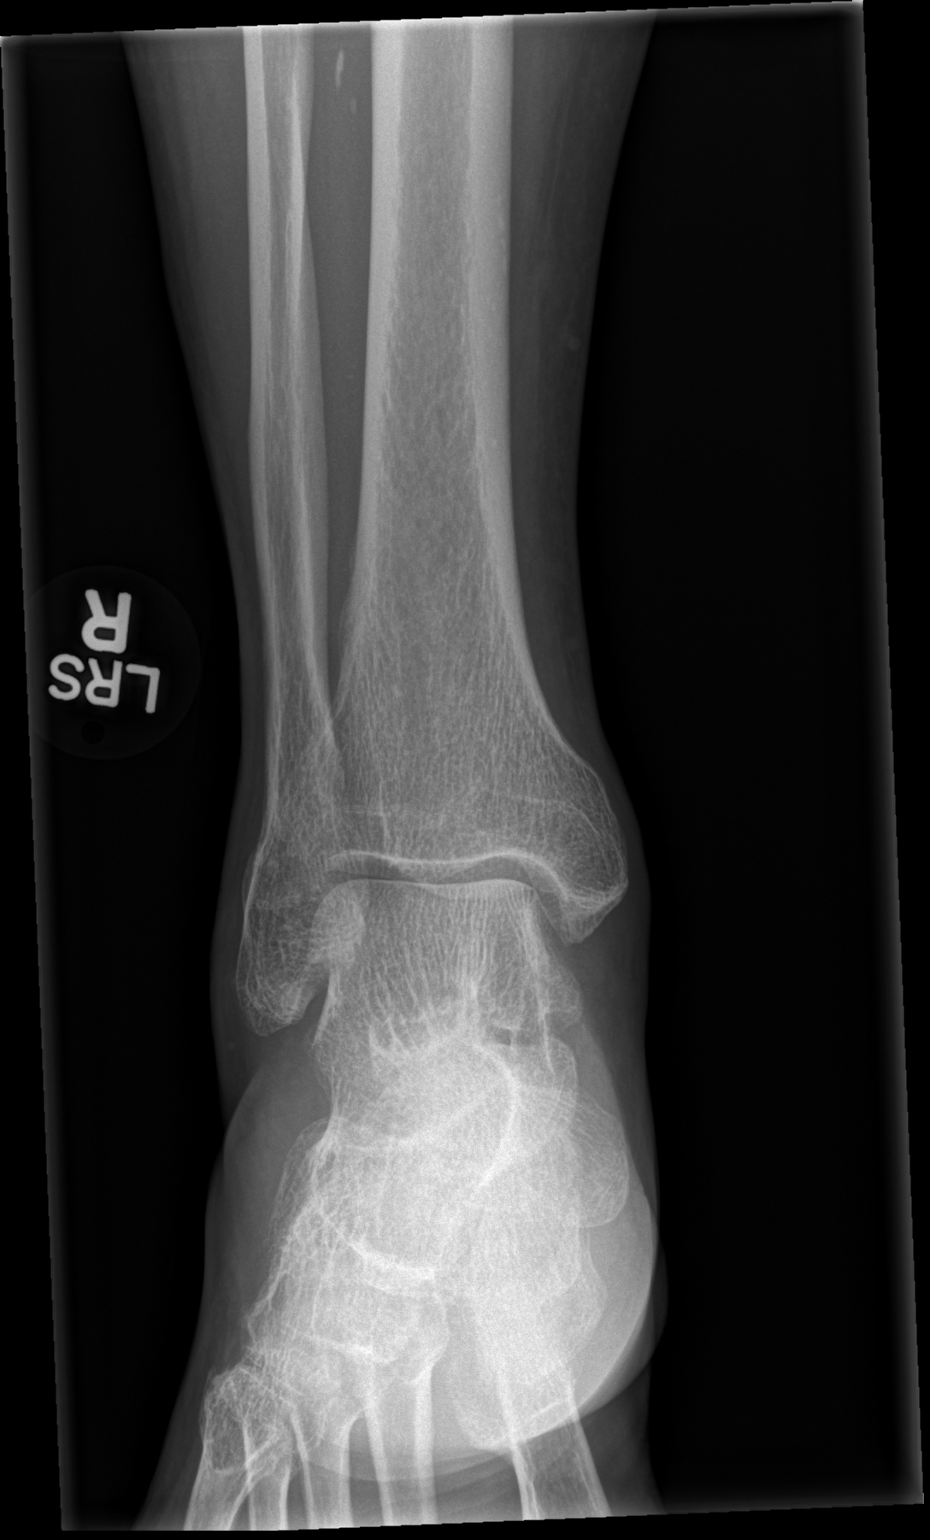

[x ankle obl right]
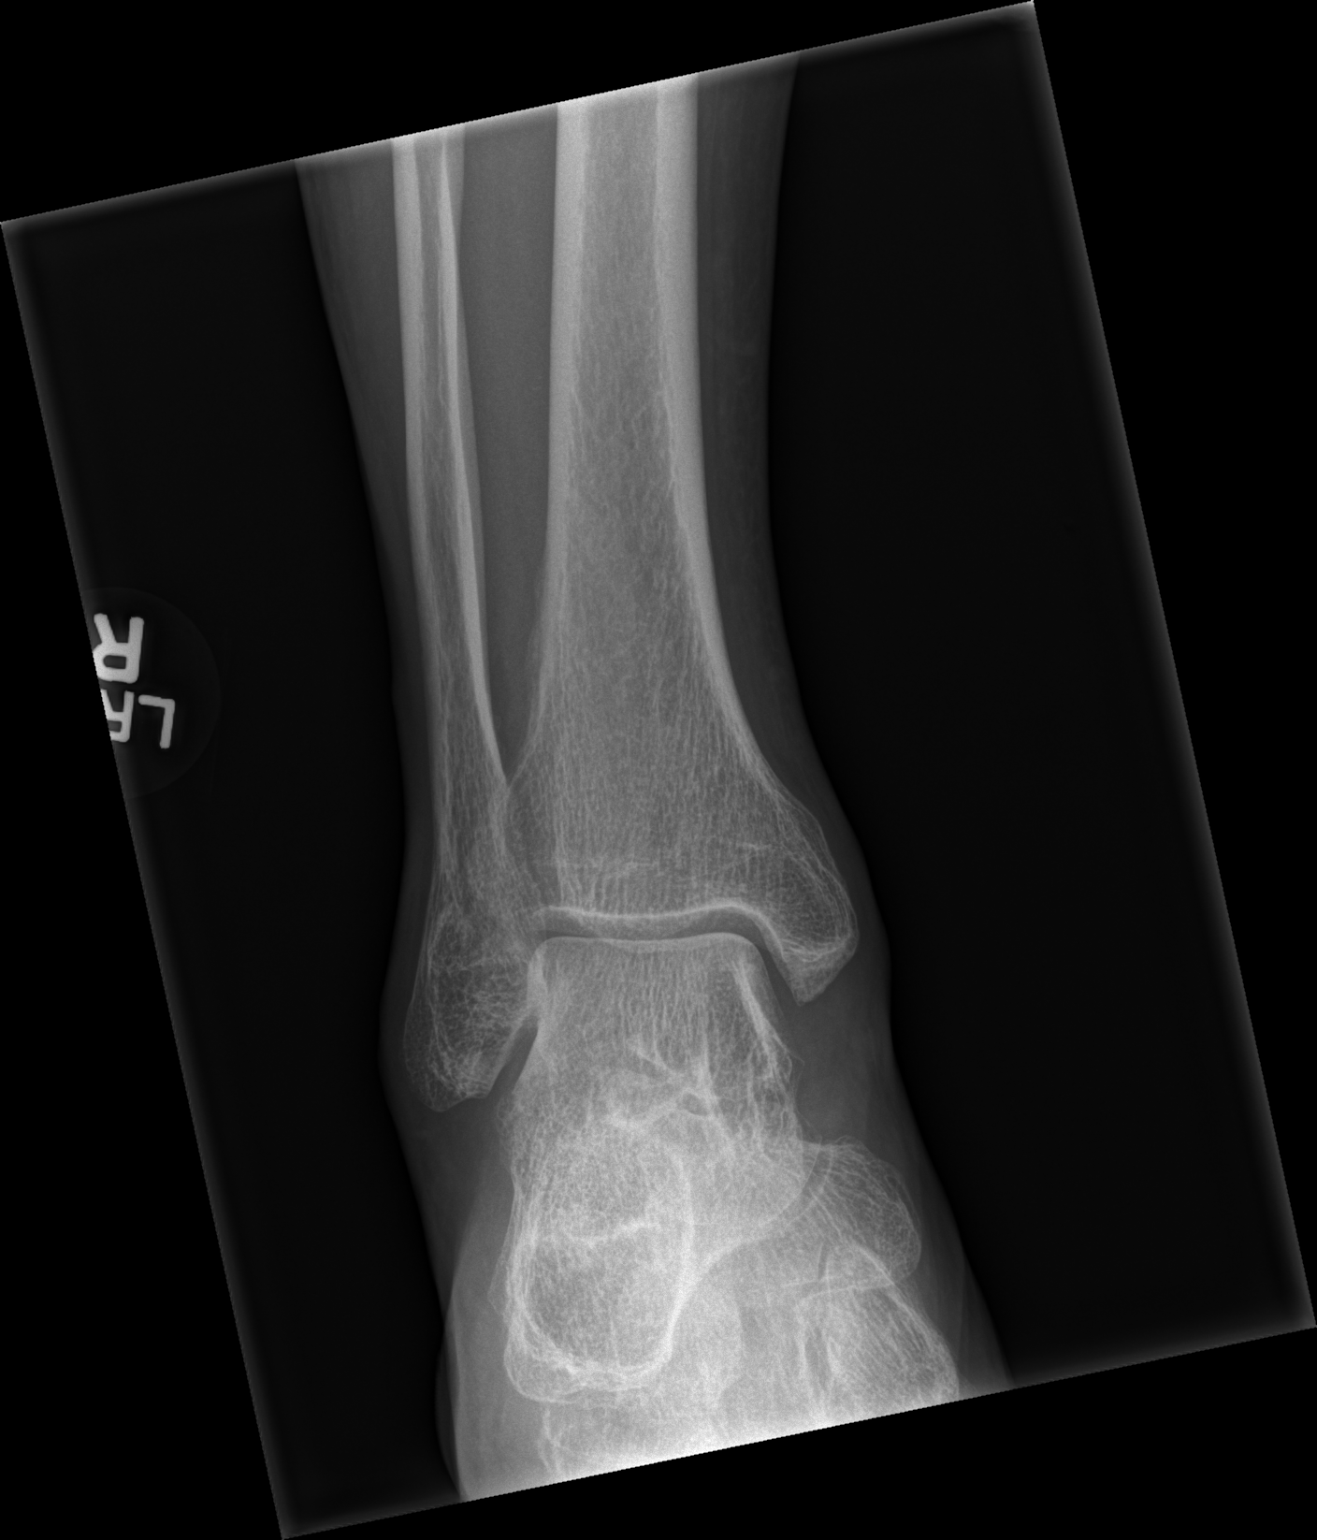

[x ankle lat right]
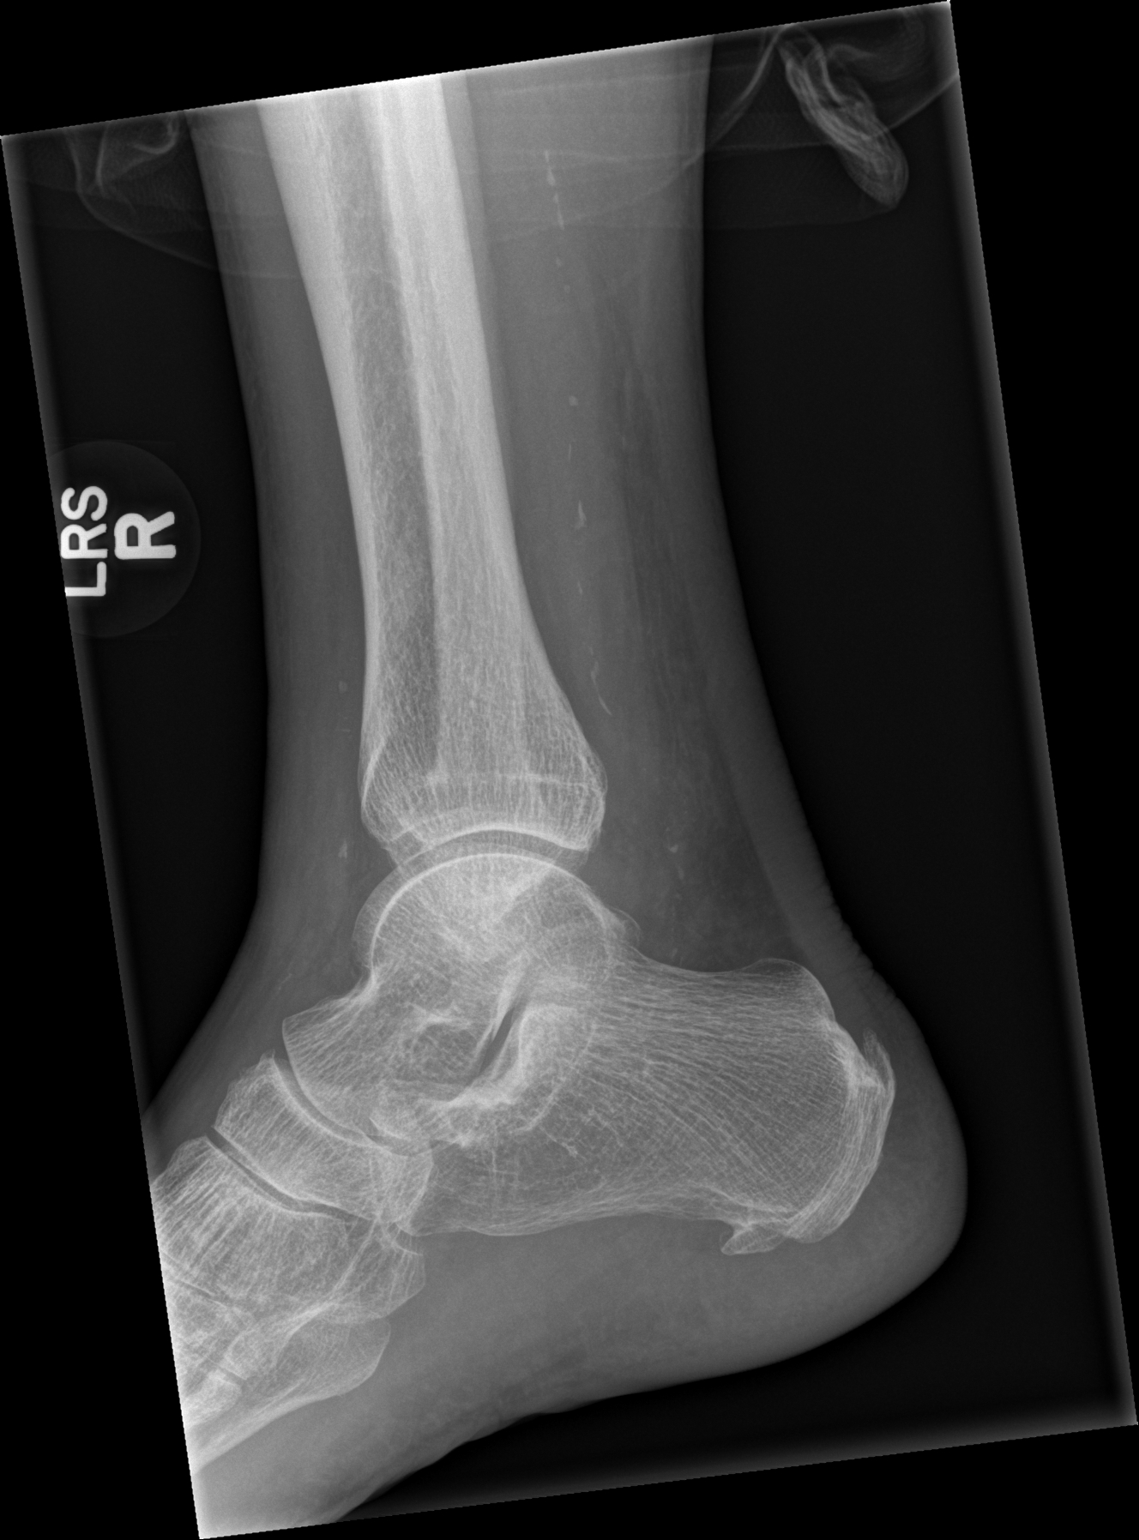

[3 of 3 positions shown; findings below may reference images not displayed]

FINDINGS: Three views of the right ankle submitted. No acute fracture or
subluxation. Ankle mortise is preserved. There is plantar and
posterior spurring of calcaneus.
IMPRESSION: No acute fracture or subluxation. Plantar and posterior spurring of
calcaneus.

## 2015-12-15 MED ORDER — RISPERIDONE 1 MG PO TABS: 1.0000 mg | ORAL_TABLET | Freq: Two times a day (BID) | ORAL | 0 refills | Status: DC

## 2015-12-15 MED ORDER — DIVALPROEX SODIUM ER 250 MG PO TB24: 750.0000 mg | ORAL_TABLET | Freq: Every day | ORAL | 0 refills | Status: DC

## 2015-12-15 NOTE — Telephone Encounter (Signed)
Patrick Holt would like to have the orders for social worker evaluation changed from last week to this week. Please advise. Fax [phone]. Please advise. Thanks! ep

## 2015-12-15 NOTE — Telephone Encounter (Signed)
Called and left a voicemail for Ms. Carrie at [facility] in response to the discrepancy between the medication list in Epic and at her end. Advised to use the medication list we faxed her for now. I sent prescriptions for risperidone and Depakote to his pharmacy. Long-term goal is to get the patient to psychiatry or [facility] for management of bipolar disorder/schizophrenia.

## 2015-12-15 NOTE — Telephone Encounter (Signed)
I also contacted Home Health aid to verify that she had received the fax of current medications the pt was on and she said that she had and that she did not have an opportunity to go over these with pt due to her going to pt house and pt telling her that he had spoken with dr. Nori Riis and that they were getting him another home health agency to come out and he would no longer need her.  I told her that the pt had not spoken with Dr. Nori Riis and she said that she would contact pt today and see what he said and if he declined her again they would have to discharge him. She stated that if we did not here from her then she discharged him. Katharina Caper, Rashika Bettes D, [place]

## 2015-12-15 NOTE — Telephone Encounter (Signed)
Contacted pt to inform him of below and he stated that he had not heard from Dr. Nori Riis and I told him that Dr. Cyndia Skeeters was his PCP and he stated that he fired Dr. Cyndia Skeeters and that he had to go and fill out papers at the new doctor office and then they would be calling him to set up an appointment. I did inform pt that he should probably continue with his current home health aid until he gets in with his new PCP. Katharina Caper, April D, [place]

## 2015-12-15 NOTE — Progress Notes (Signed)
Refilled Depakote and risperidone

## 2015-12-17 ENCOUNTER — Telehealth: Payer: Self-pay | Admitting: Student

## 2015-12-17 NOTE — Telephone Encounter (Signed)
Patrick Holt for [facility] calls again. Would like orders asap so they can see patient before the end of the week.

## 2015-12-17 NOTE — Telephone Encounter (Signed)
Paged Dr. Cyndia Skeeters and he okay'd orders. LMOVM for [place] with orders.

## 2015-12-17 NOTE — Telephone Encounter (Signed)
Patrick Holt wanted to let us know pt refused pt visit today and no longer wants any services. Patrick Holt also stated pt seems "fire happy" and is keeping a list of everyone he has fired. Please advise. Thanks! ep

## 2015-12-18 ENCOUNTER — Telehealth: Payer: Self-pay | Admitting: Student

## 2015-12-18 NOTE — Telephone Encounter (Signed)
Signed a verbal order form for social work visit and placed the signed form in outgoing fax box

## 2015-12-25 ENCOUNTER — Other Ambulatory Visit: Payer: Self-pay | Admitting: Student

## 2015-12-25 NOTE — Telephone Encounter (Signed)
Needs refill on alprazalom generic for diarrhea, and generic for nexium.  [facility] on [address]

## 2015-12-26 NOTE — Telephone Encounter (Signed)
I am no longer his PCP per patient. See telephone note from 12/11/15. Regardless, Xanax is not safe for him at this point. He is high fall risk. He was recently hospitalized with broken hand and taken off his xanax.  Thanks! Bretta Bang

## 2015-12-29 ENCOUNTER — Other Ambulatory Visit: Payer: Self-pay | Admitting: *Deleted

## 2015-12-31 NOTE — Telephone Encounter (Signed)
Contacted pt and informed him of below and he stated that he had all of his RX except his alprazalom and I told him what Dr. Cyndia Skeeters said and he understood. Katharina Caper, Essex Perry D, [place]

## 2016-01-13 ENCOUNTER — Ambulatory Visit: Payer: Self-pay | Admitting: Family Medicine

## 2016-01-30 ENCOUNTER — Emergency Department (HOSPITAL_COMMUNITY): Payer: Medicare Other

## 2016-01-30 ENCOUNTER — Emergency Department (HOSPITAL_COMMUNITY)
Admission: EM | Admit: 2016-01-30 | Discharge: 2016-01-30 | Disposition: A | Discharge status: Home / Self Care | Payer: Medicare Other | Attending: Emergency Medicine | Admitting: Emergency Medicine

## 2016-01-30 DIAGNOSIS — I252 Old myocardial infarction: Secondary | ICD-10-CM | POA: Insufficient documentation

## 2016-01-30 DIAGNOSIS — Z85038 Personal history of other malignant neoplasm of large intestine: Secondary | ICD-10-CM | POA: Diagnosis not present

## 2016-01-30 DIAGNOSIS — R05 Cough: Secondary | ICD-10-CM | POA: Diagnosis present

## 2016-01-30 DIAGNOSIS — I10 Essential (primary) hypertension: Secondary | ICD-10-CM | POA: Diagnosis not present

## 2016-01-30 DIAGNOSIS — Z87891 Personal history of nicotine dependence: Secondary | ICD-10-CM | POA: Insufficient documentation

## 2016-01-30 DIAGNOSIS — J441 Chronic obstructive pulmonary disease with (acute) exacerbation: Secondary | ICD-10-CM

## 2016-01-30 DIAGNOSIS — Z7982 Long term (current) use of aspirin: Secondary | ICD-10-CM | POA: Insufficient documentation

## 2016-01-30 DIAGNOSIS — I4891 Unspecified atrial fibrillation: Secondary | ICD-10-CM | POA: Insufficient documentation

## 2016-01-30 DIAGNOSIS — I2510 Atherosclerotic heart disease of native coronary artery without angina pectoris: Secondary | ICD-10-CM | POA: Diagnosis not present

## 2016-01-30 DIAGNOSIS — E119 Type 2 diabetes mellitus without complications: Secondary | ICD-10-CM | POA: Diagnosis not present

## 2016-01-30 LAB — BASIC METABOLIC PANEL
Anion gap: 8 (ref 5–15)
BUN: 7 mg/dL (ref 6–20)
CHLORIDE: 105 mmol/L (ref 101–111)
CO2: 25 mmol/L (ref 22–32)
CREATININE: 0.98 mg/dL (ref 0.61–1.24)
Calcium: 8.7 mg/dL — ABNORMAL LOW (ref 8.9–10.3)
Glucose, Bld: 88 mg/dL (ref 65–99)
POTASSIUM: 3.6 mmol/L (ref 3.5–5.1)
SODIUM: 138 mmol/L (ref 135–145)

## 2016-01-30 LAB — CBC
HCT: 45.4 % (ref 39.0–52.0)
Hemoglobin: 15.3 g/dL (ref 13.0–17.0)
MCH: 30.7 pg (ref 26.0–34.0)
MCHC: 33.7 g/dL (ref 30.0–36.0)
MCV: 91 fL (ref 78.0–100.0)
PLATELETS: 205 10*3/uL (ref 150–400)
RBC: 4.99 MIL/uL (ref 4.22–5.81)
RDW: 15.5 % (ref 11.5–15.5)
WBC: 7.2 10*3/uL (ref 4.0–10.5)

## 2016-01-30 LAB — I-STAT TROPONIN, ED: Troponin i, poc: 0 ng/mL (ref 0.00–0.08)

## 2016-01-30 MED ORDER — PREDNISONE 50 MG PO TABS: 50.0000 mg | ORAL_TABLET | Freq: Every day | ORAL | 0 refills | Status: DC

## 2016-01-30 MED ORDER — DOXYCYCLINE HYCLATE 100 MG PO TABS: 100.0000 mg | ORAL_TABLET | Freq: Two times a day (BID) | ORAL | 0 refills | Status: DC

## 2016-01-30 MED ORDER — DILTIAZEM HCL 100 MG IV SOLR
5.0000 mg/h | INTRAVENOUS | Status: DC
  Administered 2016-01-30: 5 mg/h via INTRAVENOUS
  Filled 2016-01-30: qty 100

## 2016-01-30 MED ORDER — DOXYCYCLINE HYCLATE 100 MG PO TABS
100.0000 mg | ORAL_TABLET | Freq: Once | ORAL | Status: AC
  Administered 2016-01-30: 100 mg via ORAL
  Filled 2016-01-30: qty 1

## 2016-01-30 MED ORDER — DILTIAZEM LOAD VIA INFUSION
20.0000 mg | Freq: Once | INTRAVENOUS | Status: AC
  Administered 2016-01-30: 20 mg via INTRAVENOUS
  Filled 2016-01-30: qty 20

## 2016-01-30 MED ORDER — PREDNISONE 20 MG PO TABS
60.0000 mg | ORAL_TABLET | Freq: Once | ORAL | Status: AC
  Administered 2016-01-30: 60 mg via ORAL
  Filled 2016-01-30: qty 3

## 2016-01-30 MED ORDER — IPRATROPIUM-ALBUTEROL 0.5-2.5 (3) MG/3ML IN SOLN: 3.0000 mL | Freq: Four times a day (QID) | RESPIRATORY_TRACT | Status: DC | PRN

## 2016-01-30 MED ORDER — IPRATROPIUM-ALBUTEROL 0.5-2.5 (3) MG/3ML IN SOLN
3.0000 mL | Freq: Four times a day (QID) | RESPIRATORY_TRACT | Status: DC
  Administered 2016-01-30: 3 mL via RESPIRATORY_TRACT
  Filled 2016-01-30: qty 3

## 2016-01-30 NOTE — Progress Notes (Signed)
Taxi Voucher provided to Patient for transport home.          Lorrine Kin, MSW, LCSW [facility] ED/10M Clinical Social Worker [phone]

## 2016-01-30 NOTE — ED Provider Notes (Signed)
[facility] DEPT Provider Note   CSN: [id number] Arrival date & time: 01/30/16  0808   History   Chief Complaint Chief Complaint  Patient presents with  . Chest Pain    HPI Patrick Holt is a 65 y.o. male with a PMH of COPD, hypertension, type 2 diabetes, hyperlipidemia, anxiety, depression, schizophrenia, history of MI x 3 (1999, 2000, 2004), bipolar disorder, sarcoidosis, paroxysmal A-fib, history of colon cancer presenting to the ED with productive cough for the last 3 days. He states that his cough is productive of yellow/red sputum. He had EMS come out to the house yesterday. They checked his vitals which were stable, so he did not want to come into the emergency department. This morning he called his new doctor, Patrick Holt, who advised him to come into the office. Patient could not come into the office because he didn't have a ride, so his doctor suggested that he come to the emergency department. Per patient, his doctor was concerned that he had a pneumonia. He states that over the last 3 days, he has felt feverish but has not checked his temperature. He also endorses chills. He denies any nausea or vomiting. He states he has been drinking a lot of water to keep himself hydrated. He endorses chest pain with coughing. The chest pain is located "all over" and is not worse with walking. The chest pain feels like a "soreness". He has been using cough drops at home without relief. He also endorses rhinorrhea and mild sore throat.  HPI  Past Medical History:  Diagnosis Date  . Anemia   . Anxiety   . Asthma   . Atrial fibrillation ([place]) 01/2012   with RVR  . Bipolar 1 disorder ([place])   . Cancer ([place])   . Collagen vascular disease ([place])   . Conversion disorder 06/2010  . COPD (chronic obstructive pulmonary disease) ([place])   . Dermatomyositis ([place])   . Dermatomyositis ([place])   . Diverticulitis   . Diverticulosis of colon   . Dysrhythmia    "irregular" (11/15/2012)  .  Esophageal dysmotility   . Esophageal stricture   . Fibromyalgia   . Gastritis   . GERD (gastroesophageal reflux disease)   . Headache(784.0)    "severe; get them often" (11/15/2012)  . Hiatal hernia   . History of narcotic addiction ([place])   . Hx of adenomatous colonic polyps   . Hyperlipidemia   . Hypertension   . Internal hemorrhoids   . Ischemic heart disease   . Major depression    with acute psychotic break in 06/2010  . Myocardial infarct    mulitple (1999, 2000, 2004)  . Narcotic dependence ([place])   . Nephrolithiasis   . Obesity   . OCD (obsessive compulsive disorder)   . Otosclerosis   . Paroxysmal a-fib ([place])   . Peripheral neuropathy ([place])   . Raynaud's disease   . Renal insufficiency   . Rheumatoid arthritis(714.0)   . Sarcoidosis ([place])   . Seizures ([place])   . Subarachnoid hemorrhage ([place]) 01/2012   with subdural  hematoma.   . Type II diabetes mellitus ([place])   . Urge incontinence   . Vertigo     Patient Active Problem List   Diagnosis Date Noted  . Altered mental state 11/14/2015  . Severe dehydration 11/14/2015  . Hypernatremia 11/14/2015  . Difficulty in walking, not elsewhere classified   . Right wrist fracture   . Closed displaced fracture of ulnar styloid with routine healing   .  Scapholunate ligament injury with no instability   . Falls frequently   . Hypoxia 11/02/2015  . Anxiety 06/06/2015  . Impaired mobility 04/28/2015  . Lumbar spondylosis with myelopathy 03/07/2015  . Nocturia 12/03/2014  . Chronic venous insufficiency 09/23/2014  . Poor social situation 07/30/2014  . Schizophrenia, acute ([place])   . Schizoaffective disorder, unspecified type ([place])   . Decreased visual acuity 06/27/2014  . COPD exacerbation ([place])   . Right sided weakness 01/04/2014  . Essential hypertension, benign   . Atrial fibrillation, unspecified   . Radiculopathy of cervical region   . Polyuria 12/07/2013  . Erectile dysfunction 12/07/2013  . Diverticulitis  08/01/2013  . Acute diverticulitis 08/01/2013  . Hematochezia 06/04/2013  . Hypokalemia 10/03/2012  . Benign neoplasm of colon 09/27/2012  . Stricture and stenosis of esophagus 09/27/2012  . Diverticulitis of colon without hemorrhage 07/26/2012  . Polypharmacy 03/26/2012  . Seizures ([place]) 02/10/2012  . Atrial fibrillation ([place]) 02/05/2012  . Coronary atherosclerosis of native coronary artery 02/05/2012  . Radicular low back pain 03/09/2011  . History of seizure disorder 01/29/2011  . Irritable bowel syndrome (IBS) 06/24/2010  . Urge incontinence 06/19/2010  . TOBACCO USER 11/23/2008  . DEPRESSION, MAJOR 05/31/2008  . ISCHEMIC HEART DISEASE 05/31/2008  . RAYNAUD'S DISEASE 05/31/2008  . HIATAL HERNIA 05/31/2008  . Rheumatoid arthritis ([place]) 05/31/2008  . FIBROMYALGIA 05/31/2008  . Hyperlipidemia 01/26/2008  . Anxiety state 01/26/2008  . HEMORRHOIDS, INTERNAL 01/26/2008  . COPD (chronic obstructive pulmonary disease) ([place]) 01/26/2008  . ESOPHAGEAL MOTILITY DISORDER 01/26/2008  . GERD 01/26/2008  . Dermatomyositis ([place]) 01/26/2008    Past Surgical History:  Procedure Laterality Date  . BACK SURGERY    . CARDIAC CATHETERIZATION    . CARPAL TUNNEL RELEASE Bilateral   . CATARACT EXTRACTION W/ INTRAOCULAR LENS IMPLANT Left   . COLONOSCOPY N/A 09/27/2012   Procedure: COLONOSCOPY;  Surgeon: Lafayette Dragon, MD;  Location: [facility];  Service: Endoscopy;  Laterality: N/A;  . ESOPHAGOGASTRODUODENOSCOPY N/A 09/27/2012   Procedure: ESOPHAGOGASTRODUODENOSCOPY (EGD);  Surgeon: Lafayette Dragon, MD;  Location: [facility];  Service: Endoscopy;  Laterality: N/A;  . KNEE ARTHROSCOPY W/ MENISCAL REPAIR Left 2009  . LUMBAR DISC SURGERY    . LYMPH NODE DISSECTION Right 1970's   "neck; dr thought I had Hodgkins; turned out to be sarcoidosis" (11/15/2012)  . squamous papilloma   2010   removed by Dr. Constance Holster ENT, noted on tongue  . TONSILLECTOMY        Home Medications    Prior to Admission  medications   Medication Sig Start Date End Date Taking? Authorizing Provider  amiodarone (PACERONE) 200 MG tablet Take 0.5 tablets (100 mg total) by mouth daily. 10/17/15   [name], MD  amLODipine (NORVASC) 5 MG tablet Take 1 tablet (5 mg total) by mouth daily. 11/05/15   Phil Campbell Bing, DO  aspirin EC 81 MG tablet Take 1 tablet (81 mg total) by mouth daily. 10/17/15   [name], MD  atorvastatin (LIPITOR) 40 MG tablet Take 1 tablet (40 mg total) by mouth daily. Patient taking differently: Take 40 mg by mouth at bedtime.  10/17/15   [name], MD  clonazePAM (KLONOPIN) 0.5 MG tablet Take 1 tablet (0.5 mg total) by mouth daily. 11/18/15   Silver Huguenin Elgergawy, MD  cyanocobalamin (,VITAMIN B-12,) 1000 MCG/ML injection Please give IM once daily for 3 days, then once weekly for 4 weeks, then once monthly thereafter 11/18/15   Silver Huguenin Elgergawy,  MD  divalproex (DEPAKOTE ER) 250 MG 24 hr tablet Take 3 tablets (750 mg total) by mouth daily. 12/15/15   [name], MD  doxycycline (VIBRA-TABS) 100 MG tablet Take 1 tablet (100 mg total) by mouth 2 (two) times daily. 01/30/16   Sela Hua, MD  folic acid (FOLVITE) 1 MG tablet Take 2 tablets (2 mg total) by mouth daily. 11/18/15   Silver Huguenin Elgergawy, MD  guaiFENesin (MUCINEX) 600 MG 12 hr tablet Take 1 tablet (600 mg total) by mouth 2 (two) times daily. OTC 10/17/15   [name], MD  ibuprofen (ADVIL,MOTRIN) 400 MG tablet Take 0.5 tablets (200 mg total) by mouth every 8 (eight) hours as needed for moderate pain. 12/12/15   [name], MD  ipratropium-albuterol (DUONEB) 0.5-2.5 (3) MG/3ML SOLN Inhale 3 mLs into the lungs every 6 (six) hours as needed. 11/17/15   Silver Huguenin Elgergawy, MD  lactulose (CHRONULAC) 10 GM/15ML solution Take 30 mLs (20 g total) by mouth 2 (two) times daily. Please titrate for 2-3 soft bowel movements per day 11/18/15   Silver Huguenin Elgergawy, MD  levETIRAcetam (KEPPRA) 500 MG tablet TAKE 1 TABLET(500 MG) BY MOUTH TWICE DAILY 10/17/15    [name], MD  loperamide (IMODIUM) 2 MG capsule TAKE 1 TO 2 CAPSULES BY MOUTH 3 TIMES A DAY AS NEEDED FOR DIARRHEA Patient taking differently: Take 2 mg by mouth every 8 (eight) hours as needed for diarrhea or loose stools.  08/20/15   [name], MD  meclizine (ANTIVERT) 25 MG tablet TAKE 1 TABLET (25 MG TOTAL) BY MOUTH 3 (THREE) TIMES DAILY AS NEEDED. Patient taking differently: Take 25 mg by mouth every 8 (eight) hours as needed for dizziness. TAKE 1 TABLET (25 MG TOTAL) BY MOUTH 3 (THREE) TIMES DAILY AS NEEDED. 10/17/15   [name], MD  Multiple Vitamin (MULTIVITAMIN WITH MINERALS) TABS tablet Take 1 tablet by mouth daily. 10/17/15   [name], MD  omeprazole (PRILOSEC) 20 MG capsule Take 20 mg by mouth daily.    Historical Provider, MD  Potassium Chloride ER 20 MEQ TBCR Take 1 tablet by mouth every other day.  11/29/14   Historical Provider, MD  predniSONE (DELTASONE) 50 MG tablet Take 1 tablet (50 mg total) by mouth daily with breakfast. 01/31/16   Sela Hua, MD  risperiDONE (RISPERDAL) 1 MG tablet Take 1 tablet (1 mg total) by mouth 2 (two) times daily. 12/15/15   [name], MD  senna-docusate (SENOKOT-S) 8.6-50 MG tablet Take 1 tablet by mouth at bedtime as needed for mild constipation. 11/17/15   Albertine Patricia, MD    Family History Family History  Problem Relation Age of Onset  . Alcohol abuse Mother   . Depression Mother   . Heart disease Mother   . Diabetes Mother   . Stroke Mother   . Coronary artery disease Father   . Diabetes Other     1/2 brother  . Hepatitis Brother     Social History Social History  Substance Use Topics  . Smoking status: Former Smoker    Packs/day: 0.50    Years: 30.00    Types: Cigarettes    Quit date: 12/15/2014  . Smokeless tobacco: Never Used     Comment: 3 cigs a day  . Alcohol use No     Comment: Alcohol stopped in September of 2014     Allergies   Immune globulins; Ciprofloxacin; Flagyl [metronidazole]; Lisinopril;  Sulfa  antibiotics; Requip [ropinirole hcl]; Betamethasone dipropionate; Bupropion hcl; Clobetasol; Codeine; Escitalopram oxalate; Fluoxetine hcl; Fluticasone-salmeterol; Furosemide; Paroxetine; Penicillins; Tacrolimus; Tetanus toxoid; and Tuberculin purified protein derivative   Review of Systems Review of Systems 10 Systems reviewed and are negative for acute change except as noted in the HPI.  Physical Exam Updated Vital Signs BP 105/94   Pulse (!) 125   Temp 99 F (37.2 C) (Oral)   Resp 17   SpO2 93%   Physical Exam  Constitutional: He is oriented to person, place, and time.  Tired-appearing male in NAD, talkative, coughing intermittently throughout exam  HENT:  Head: Normocephalic and atraumatic.  Oropharynx erythematous  Eyes: Conjunctivae and EOM are normal. Pupils are equal, round, and reactive to light.  Neck: Normal range of motion. Neck supple. No JVD present.  Cardiovascular:  No murmur heard. Irregularly irregular rhythm  Pulmonary/Chest:  Crackles in the left lung base, expiratory wheezing throughout all lung fields, normal work of breathing, able to complete full sentences  Abdominal: Soft. He exhibits no distension. There is no rebound and no guarding.  Musculoskeletal: Normal range of motion. He exhibits no edema.  Lymphadenopathy:    He has no cervical adenopathy.  Neurological: He is alert and oriented to person, place, and time.  Skin: Skin is warm and dry.  Psychiatric: He has a normal mood and affect. His behavior is normal. Thought content normal.     ED Treatments / Results  Labs (all labs ordered are listed, but only abnormal results are displayed) Labs Reviewed  BASIC METABOLIC PANEL - Abnormal; Notable for the following:       Result Value   Calcium 8.7 (*)    All other components within normal limits  CBC  I-STAT TROPOININ, ED    EKG  EKG Interpretation  Date/Time:  Friday January 30 2016 08:15:54 EST Ventricular Rate:  131 PR  Interval:    QRS Duration: 85 QT Interval:  284 QTC Calculation: 424 R Axis:   35 Text Interpretation:  Atrial fibrillation Baseline wander Non-specific ST-t changes Confirmed by Ashok Cordia  MD, Lennette Bihari ([id number]) on 01/30/2016 8:38:17 AM       Radiology Dg Chest 2 View  Result Date: 01/30/2016 CLINICAL DATA:  Cough for 3 days. EXAM: CHEST  2 VIEW COMPARISON:  11/14/2015 FINDINGS: The heart size and mediastinal contours are within normal limits. Both lungs are clear. The visualized skeletal structures are unremarkable. IMPRESSION: No active cardiopulmonary disease. Electronically Signed   By: Kathreen Devoid   On: 01/30/2016 09:08    Procedures Procedures (including critical care time)  Medications Ordered in ED Medications  diltiazem (CARDIZEM) 1 mg/mL load via infusion 20 mg (20 mg Intravenous Bolus from Bag 01/30/16 0948)    And  diltiazem (CARDIZEM) 100 mg in dextrose 5 % 100 mL (1 mg/mL) infusion (5 mg/hr Intravenous New Bag/Given 01/30/16 0947)  ipratropium-albuterol (DUONEB) 0.5-2.5 (3) MG/3ML nebulizer solution 3 mL (not administered)  doxycycline (VIBRA-TABS) tablet 100 mg (100 mg Oral Given 01/30/16 0944)  predniSONE (DELTASONE) tablet 60 mg (60 mg Oral Given 01/30/16 0944)     Initial Impression / Assessment and Plan / ED Course  I have reviewed the triage vital signs and the nursing notes.  Pertinent labs & imaging results that were available during my care of the patient were reviewed by me and considered in my medical decision making (see chart for details).  Clinical Course    Patrick Holt is a 65 year old male with a PMH of COPD,  hypertension, type 2 diabetes, hyperlipidemia, anxiety, depression, schizophrenia, history of MI x 3 (1999, 2000, 2004), bipolar disorder, sarcoidosis, paroxysmal A-fib, history of colon cancer presenting to the ED with productive cough for the last three days. Think this is likely pneumonia vs COPD exacerbation. Pt noted to have crackles in his  left lung base as well as diffuse expiratory wheezing. Could also be acute bronchitis. He is currently in A-fib with a rate between 100-130s. His rate is in the low 100s at rest but then increases with coughing. Think his chest pain is likely musculoskeletal in origin because it is worse with coughing and feels like a "soreness" that is located "all over". Will get basic labs/imaging including CBC, CMP, trop, EKG, and 2 view CXR.  9:00AM: Initial labs with Hgb 15.3 and trop 0.00. EKG with A-fib and a rate of 136, ST depression in V2-V6 appears unchanged from previous EKGs.  9:20AM: CXR without focal pneumonia. Pt is breathing comfortably and his O2 saturations are 97% on room air. Still in A-fib and bouncing around from 100 to 130. Will order a dose of Cardizem to see if we can bring him back into sinus rhythm. Will also order a duoneb, Doxycycline x 1, and Prednisone x 1. Anticipate discharge home with a 5 day course of Doxycycline and Prednisone for COPD exacerbation.   10:55AM: Re-assessed patient. HR now in the 80s after Cardizem, but Pt remains in A-fib. Breathing comfortably with O2 sat 92% on room air.   12:00PM: Patient safe for discharge home with close PCP follow-up.  Final Clinical Impressions(s) / ED Diagnoses   Final diagnoses:  COPD exacerbation (HCC)    New Prescriptions New Prescriptions   DOXYCYCLINE (VIBRA-TABS) 100 MG TABLET    Take 1 tablet (100 mg total) by mouth 2 (two) times daily.   PREDNISONE (DELTASONE) 50 MG TABLET    Take 1 tablet (50 mg total) by mouth daily with breakfast.     Sela Hua, MD 01/30/16 1212    Sela Hua, MD 01/30/16 [place], MD 01/30/16 1352

## 2016-01-30 NOTE — Discharge Instructions (Signed)
It was so nice to meet you!  We think you are having a COPD exacerbation, which is causing you to cough and feel weak. In the emergency department, your oxygen levels were normal.   We are sending you home with an antibiotic called Doxycycline. Please take one tablet tonight and then take 1 tablet twice a day for the next 4 days.  We are also sending you home with Prednisone. Please take 1 tablet in the morning with breakfast starting tomorrow.  It is very important that you follow-up with your primary care doctor at the beginning of next week to make sure you are getting better.

## 2016-01-30 NOTE — ED Triage Notes (Signed)
Pt in from home c/o bil CP onset yesterday, pt denies SOB, n/v/d, pt called out yesterday for similar complaints & declined transport & cardiac monitoring in route, pt hx of x 3 MI, pt reports CP with cough, pt c/o generalized aches, pt A&O x4

## 2016-01-30 NOTE — ED Notes (Signed)
Social work to provide Clinical cytogeneticist for pt transport home

## 2016-02-23 ENCOUNTER — Ambulatory Visit (INDEPENDENT_AMBULATORY_CARE_PROVIDER_SITE_OTHER): Payer: Self-pay | Admitting: Orthopaedic Surgery

## 2016-02-27 ENCOUNTER — Ambulatory Visit (INDEPENDENT_AMBULATORY_CARE_PROVIDER_SITE_OTHER): Payer: Medicare Other | Admitting: Orthopaedic Surgery

## 2016-02-27 ENCOUNTER — Ambulatory Visit (INDEPENDENT_AMBULATORY_CARE_PROVIDER_SITE_OTHER): Payer: 59

## 2016-02-27 ENCOUNTER — Encounter (INDEPENDENT_AMBULATORY_CARE_PROVIDER_SITE_OTHER): Payer: Self-pay | Admitting: Orthopaedic Surgery

## 2016-02-27 DIAGNOSIS — M25531 Pain in right wrist: Secondary | ICD-10-CM | POA: Diagnosis not present

## 2016-02-27 NOTE — Progress Notes (Signed)
Office Visit Note   Patient: Patrick Holt           Date of Birth: 08-16-1950           MRN: [id number] Visit Date: 02/27/2016              Requested by: [name], MD [place],  [id number] PCP: [name], MD   Assessment & Plan: Visit Diagnoses:  1. 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 I'm not exactly clear as to the events that transpired.  At this point I'm not sure if the patient would benefit from osteotomy and fixation of his malunion. Going to refer him to Dr. Grandville Silos at [facility] for further evaluation and treatment.  Follow-Up Instructions: Return if symptoms worsen or fail to improve.   Orders:  Orders Placed This Encounter  Procedures  . XR Wrist Complete Right  . Ambulatory referral to Orthopedic Surgery   No orders of the defined types were placed in this encounter.     Procedures: No procedures performed   Clinical Data: No additional findings.   Subjective: Chief Complaint  Patient presents with  . Right Wrist - Pain    Patient is a 66 year old gentleman who comes in with a 65 month old right distal radius malunion.  A states that he was originally supposed to have surgery with Dr. Burney Gauze but never did. 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 I did see a note in Epic from Dr. Caralyn Guile who planned to fix his wrist back in early October but I'm not exactly sure what happened after that that led to the answering of the surgery. He states that he is not able to make a closed fist. He does have pain with movement of his wrist in all planes. Denies any numbness or tingling.     Review of Systems Complete review of systems is negative except for history of present illness  Objective: Vital Signs: There were no vitals taken for this  visit.  Physical Exam Physical exam well-developed nourished acute distress alert 3 nonlabored breathing normal judgments affect abdomen soft no lymphadenopathy Ortho Exam Exam of the right wrist and hand shows a clinically radially translated hand with a prominent distal ulna. He is not able to make a closed fist. He has a strong radial pulse and the hand is warm and perfused without any signs of carpal tunnel syndrome. Specialty Comments:  No specialty comments available.  Imaging: Xr Wrist Complete Right  Result Date: 02/27/2016 Malunion of right distal radius fracture    PMFS History: Patient Active Problem List   Diagnosis Date Noted  . Pain in right wrist 02/27/2016  . Altered mental state 11/14/2015  . Severe dehydration 11/14/2015  . Hypernatremia 11/14/2015  . Difficulty in walking, not elsewhere classified   . Right wrist fracture   . Closed displaced fracture of ulnar styloid with routine healing   . Scapholunate ligament injury with no instability   . Falls frequently   . Hypoxia 11/02/2015  . Anxiety 06/06/2015  . Impaired mobility 04/28/2015  . Lumbar spondylosis with myelopathy 03/07/2015  . Nocturia 12/03/2014  . Chronic venous insufficiency 09/23/2014  . Poor social situation 07/30/2014  . Schizophrenia, acute ([place])   . Schizoaffective disorder, unspecified type ([place])   .  Decreased visual acuity 06/27/2014  . COPD exacerbation ([place])   . Right sided weakness 01/04/2014  . Essential hypertension, benign   . Atrial fibrillation, unspecified   . Radiculopathy of cervical region   . Polyuria 12/07/2013  . Erectile dysfunction 12/07/2013  . Diverticulitis 08/01/2013  . Acute diverticulitis 08/01/2013  . Hematochezia 06/04/2013  . Hypokalemia 10/03/2012  . Benign neoplasm of colon 09/27/2012  . Stricture and stenosis of esophagus 09/27/2012  . Diverticulitis of colon without hemorrhage 07/26/2012  . Polypharmacy 03/26/2012  . Seizures ([place]) 02/10/2012    . Atrial fibrillation ([place]) 02/05/2012  . Coronary atherosclerosis of native coronary artery 02/05/2012  . Radicular low back pain 03/09/2011  . History of seizure disorder 01/29/2011  . Irritable bowel syndrome (IBS) 06/24/2010  . Urge incontinence 06/19/2010  . TOBACCO USER 11/23/2008  . DEPRESSION, MAJOR 05/31/2008  . ISCHEMIC HEART DISEASE 05/31/2008  . RAYNAUD'S DISEASE 05/31/2008  . HIATAL HERNIA 05/31/2008  . Rheumatoid arthritis ([place]) 05/31/2008  . FIBROMYALGIA 05/31/2008  . Hyperlipidemia 01/26/2008  . Anxiety state 01/26/2008  . HEMORRHOIDS, INTERNAL 01/26/2008  . COPD (chronic obstructive pulmonary disease) ([place]) 01/26/2008  . ESOPHAGEAL MOTILITY DISORDER 01/26/2008  . GERD 01/26/2008  . Dermatomyositis ([place]) 01/26/2008   Past Medical History:  Diagnosis Date  . Anemia   . Anxiety   . Asthma   . Atrial fibrillation ([place]) 01/2012   with RVR  . Bipolar 1 disorder ([place])   . Cancer ([place])   . Collagen vascular disease ([place])   . Conversion disorder 06/2010  . COPD (chronic obstructive pulmonary disease) ([place])   . Dermatomyositis ([place])   . Dermatomyositis ([place])   . Diverticulitis   . Diverticulosis of colon   . Dysrhythmia    "irregular" (11/15/2012)  . Esophageal dysmotility   . Esophageal stricture   . Fibromyalgia   . Gastritis   . GERD (gastroesophageal reflux disease)   . Headache(784.0)    "severe; get them often" (11/15/2012)  . Hiatal hernia   . History of narcotic addiction ([place])   . Hx of adenomatous colonic polyps   . Hyperlipidemia   . Hypertension   . Internal hemorrhoids   . Ischemic heart disease   . Major depression    with acute psychotic break in 06/2010  . Myocardial infarct    mulitple (1999, 2000, 2004)  . Narcotic dependence ([place])   . Nephrolithiasis   . Obesity   . OCD (obsessive compulsive disorder)   . Otosclerosis   . Paroxysmal a-fib ([place])   . Peripheral neuropathy ([place])   . Raynaud's disease   . Renal insufficiency   .  Rheumatoid arthritis(714.0)   . Sarcoidosis ([place])   . Seizures ([place])   . Subarachnoid hemorrhage ([place]) 01/2012   with subdural  hematoma.   . Type II diabetes mellitus ([place])   . Urge incontinence   . Vertigo     Family History  Problem Relation Age of Onset  . Alcohol abuse Mother   . Depression Mother   . Heart disease Mother   . Diabetes Mother   . Stroke Mother   . Coronary artery disease Father   . Diabetes Other     1/2 brother  . Hepatitis Brother     Past Surgical History:  Procedure Laterality Date  . BACK SURGERY    . CARDIAC CATHETERIZATION    . CARPAL TUNNEL RELEASE Bilateral   . CATARACT EXTRACTION W/ INTRAOCULAR LENS IMPLANT Left   . COLONOSCOPY N/A 09/27/2012  Procedure: COLONOSCOPY;  Surgeon: Lafayette Dragon, MD;  Location: [facility];  Service: Endoscopy;  Laterality: N/A;  . ESOPHAGOGASTRODUODENOSCOPY N/A 09/27/2012   Procedure: ESOPHAGOGASTRODUODENOSCOPY (EGD);  Surgeon: Lafayette Dragon, MD;  Location: [facility];  Service: Endoscopy;  Laterality: N/A;  . KNEE ARTHROSCOPY W/ MENISCAL REPAIR Left 2009  . LUMBAR DISC SURGERY    . LYMPH NODE DISSECTION Right 1970's   "neck; dr thought I had Hodgkins; turned out to be sarcoidosis" (11/15/2012)  . squamous papilloma   2010   removed by Dr. Constance Holster ENT, noted on tongue  . TONSILLECTOMY     Social History   Occupational History  . disabled    Social History Main Topics  . Smoking status: Former Smoker    Packs/day: 0.50    Years: 30.00    Types: Cigarettes    Quit date: 12/15/2014  . Smokeless tobacco: Never Used     Comment: 3 cigs a day  . Alcohol use No     Comment: Alcohol stopped in September of 2014  . Drug use: No  . Sexual activity: No

## 2016-03-01 ENCOUNTER — Telehealth (INDEPENDENT_AMBULATORY_CARE_PROVIDER_SITE_OTHER): Payer: Self-pay | Admitting: *Deleted

## 2016-03-01 DIAGNOSIS — M25531 Pain in right wrist: Secondary | ICD-10-CM

## 2016-03-01 NOTE — Telephone Encounter (Signed)
Please advise on where you would like to send patient to instead.

## 2016-03-01 NOTE — Telephone Encounter (Signed)
Janele called to give a list of Dr.s that are in network for Dr. Erlinda Hong to refer pt too: Dr Grandville Silos at [facility] is not in network. The Drs. Are in network for pt. Dr. Burney Gauze [phone] Patrick Holt. Dr. Apolonio Schneiders [phone].

## 2016-03-02 NOTE — Telephone Encounter (Signed)
Ok refer to [facility]

## 2016-03-05 NOTE — Telephone Encounter (Signed)
Referral made to Dr Burney Gauze.

## 2016-03-08 ENCOUNTER — Telehealth (INDEPENDENT_AMBULATORY_CARE_PROVIDER_SITE_OTHER): Payer: Self-pay

## 2016-03-08 NOTE — Telephone Encounter (Signed)
Mitchell Heir called from Dr Bertis Ruddy office, states DR is out of the office. Per nurse she sates Dr Burney Gauze advised pt he will treat him no oper. Last OV with him was back in October. She states he may have psychotic disorder. She thinks he doesn't listen or remember things. I advised her to let Dr Burney Gauze know about the referral to see what he would like to do & just to let us know.

## 2016-03-18 ENCOUNTER — Telehealth (INDEPENDENT_AMBULATORY_CARE_PROVIDER_SITE_OTHER): Payer: Self-pay | Admitting: Orthopaedic Surgery

## 2016-03-18 NOTE — Telephone Encounter (Signed)
FYI pt referral was declined   [phone]

## 2016-03-30 ENCOUNTER — Emergency Department (HOSPITAL_COMMUNITY)
Admission: EM | Admit: 2016-03-30 | Discharge: 2016-03-30 | Disposition: A | Discharge status: Home / Self Care | Payer: Medicare Other | Attending: Emergency Medicine | Admitting: Emergency Medicine

## 2016-03-30 ENCOUNTER — Emergency Department (HOSPITAL_COMMUNITY): Payer: Medicare Other

## 2016-03-30 ENCOUNTER — Encounter (HOSPITAL_COMMUNITY): Payer: Self-pay

## 2016-03-30 DIAGNOSIS — I10 Essential (primary) hypertension: Secondary | ICD-10-CM | POA: Insufficient documentation

## 2016-03-30 DIAGNOSIS — E1140 Type 2 diabetes mellitus with diabetic neuropathy, unspecified: Secondary | ICD-10-CM | POA: Diagnosis not present

## 2016-03-30 DIAGNOSIS — Z7982 Long term (current) use of aspirin: Secondary | ICD-10-CM | POA: Diagnosis not present

## 2016-03-30 DIAGNOSIS — R935 Abnormal findings on diagnostic imaging of other abdominal regions, including retroperitoneum: Secondary | ICD-10-CM | POA: Diagnosis not present

## 2016-03-30 DIAGNOSIS — I252 Old myocardial infarction: Secondary | ICD-10-CM | POA: Insufficient documentation

## 2016-03-30 DIAGNOSIS — I2510 Atherosclerotic heart disease of native coronary artery without angina pectoris: Secondary | ICD-10-CM | POA: Insufficient documentation

## 2016-03-30 DIAGNOSIS — J449 Chronic obstructive pulmonary disease, unspecified: Secondary | ICD-10-CM | POA: Diagnosis not present

## 2016-03-30 DIAGNOSIS — M545 Low back pain, unspecified: Secondary | ICD-10-CM

## 2016-03-30 DIAGNOSIS — Z87891 Personal history of nicotine dependence: Secondary | ICD-10-CM | POA: Insufficient documentation

## 2016-03-30 DIAGNOSIS — Z79899 Other long term (current) drug therapy: Secondary | ICD-10-CM | POA: Diagnosis not present

## 2016-03-30 LAB — CBC WITH DIFFERENTIAL/PLATELET
BASOS ABS: 0 10*3/uL (ref 0.0–0.1)
Basophils Relative: 0 %
EOS ABS: 0.2 10*3/uL (ref 0.0–0.7)
EOS PCT: 3 %
HCT: 47 % (ref 39.0–52.0)
HEMOGLOBIN: 15.3 g/dL (ref 13.0–17.0)
LYMPHS ABS: 2.7 10*3/uL (ref 0.7–4.0)
LYMPHS PCT: 39 %
MCH: 30.7 pg (ref 26.0–34.0)
MCHC: 32.6 g/dL (ref 30.0–36.0)
MCV: 94.4 fL (ref 78.0–100.0)
Monocytes Absolute: 0.6 10*3/uL (ref 0.1–1.0)
Monocytes Relative: 9 %
NEUTROS PCT: 49 %
Neutro Abs: 3.4 10*3/uL (ref 1.7–7.7)
PLATELETS: 205 10*3/uL (ref 150–400)
RBC: 4.98 MIL/uL (ref 4.22–5.81)
RDW: 14.8 % (ref 11.5–15.5)
WBC: 6.9 10*3/uL (ref 4.0–10.5)

## 2016-03-30 LAB — I-STAT CHEM 8, ED
BUN: 19 mg/dL (ref 6–20)
CALCIUM ION: 1.22 mmol/L (ref 1.15–1.40)
CHLORIDE: 105 mmol/L (ref 101–111)
Creatinine, Ser: 1 mg/dL (ref 0.61–1.24)
GLUCOSE: 111 mg/dL — AB (ref 65–99)
HCT: 46 % (ref 39.0–52.0)
HEMOGLOBIN: 15.6 g/dL (ref 13.0–17.0)
POTASSIUM: 4.5 mmol/L (ref 3.5–5.1)
SODIUM: 147 mmol/L — AB (ref 135–145)
TCO2: 33 mmol/L (ref 0–100)

## 2016-03-30 MED ORDER — KETOROLAC TROMETHAMINE 60 MG/2ML IM SOLN
30.0000 mg | Freq: Once | INTRAMUSCULAR | Status: AC
  Administered 2016-03-30: 30 mg via INTRAMUSCULAR
  Filled 2016-03-30: qty 2

## 2016-03-30 MED ORDER — METOPROLOL TARTRATE 5 MG/5ML IV SOLN
10.0000 mg | Freq: Once | INTRAVENOUS | Status: AC
  Administered 2016-03-30: 10 mg via INTRAVENOUS
  Filled 2016-03-30: qty 10

## 2016-03-30 NOTE — ED Triage Notes (Signed)
Paramedics reports that pt. Calls out every day for pain.  Pt. Has chronic back.  He reports that he is unable to walk, but paramedics reports that the pt. Got up walked to his door unlocked the door.  The doctor changed his medications yesterday and told him to stop the Klonopin and the Xanax.  Pt. Thinks he may be taking both.  Pt. Is alert and oriented X4.  Answers questions appropriately.   Pt. Is very sleepy.

## 2016-03-30 NOTE — Discharge Instructions (Signed)
Follow up with your family doc for your back pain. Return if you pee or poop on yourself or dont feel the toilet paper when you wipe your bottom.

## 2016-03-30 NOTE — ED Provider Notes (Signed)
[facility] DEPT Provider Note   CSN: [id number] Arrival date & time: 03/30/16  [id number]     History   Chief Complaint Chief Complaint  Patient presents with  . Back Pain    HPI Patrick Holt is a 66 y.o. male.  66 yo M with a chief complaint of lower back pain. Patient is a difficult historian secondary to his schizophrenia. States that he has been having low back pain worsening over the past couple days. Worse with movement and palpation. 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 He denies any recent injury denies fevers denies loss of bowel or bladder. Denies weakness to his extremities. He does state that he is unable to walk though was able to walk and let EMS into his house this morning. 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   The history is provided by the patient.  Back Pain   This is a new problem. The current episode started more than 2 days ago. The problem occurs constantly. The problem has not changed since onset.The pain is associated with no known injury. The pain is present in the lumbar spine. The quality of the pain is described as cramping and stabbing. The pain does not radiate. The pain is at a severity of 8/10. The pain is moderate. The symptoms are aggravated by bending and twisting. Pertinent negatives include no chest pain, no fever, no headaches and no abdominal pain. He has tried nothing for the symptoms. The treatment provided no relief.    Past Medical History:  Diagnosis Date  . Anemia   . Anxiety   . Asthma   . Atrial fibrillation ([place]) 01/2012   with RVR  . Bipolar 1 disorder ([place])   . Cancer ([place])   . Collagen vascular disease ([place])   . Conversion disorder 06/2010  . COPD (chronic obstructive pulmonary disease) ([place])   . Dermatomyositis ([place])   . Dermatomyositis ([place])   . Diverticulitis   . Diverticulosis of colon   . Dysrhythmia    "irregular" (11/15/2012)  . Esophageal dysmotility   . Esophageal stricture   . Fibromyalgia   . Gastritis   . GERD (gastroesophageal reflux disease)   . Headache(784.0)    "severe; get them often" (11/15/2012)  . Hiatal hernia   . History of narcotic addiction ([place])   . Hx of adenomatous colonic polyps   . Hyperlipidemia   . Hypertension   . Internal hemorrhoids   . Ischemic heart disease   . Major depression    with acute psychotic break in 06/2010  . Myocardial infarct    mulitple (1999, 2000, 2004)  . Narcotic dependence ([place])   . Nephrolithiasis   . Obesity   . OCD (obsessive compulsive disorder)   . Otosclerosis   . Paroxysmal a-fib ([place])   . Peripheral neuropathy ([place])   . Raynaud's disease   . Renal insufficiency   . Rheumatoid arthritis(714.0)   . Sarcoidosis ([place])   . Seizures ([place])   . Subarachnoid hemorrhage ([place]) 01/2012   with subdural  hematoma.   . Type II diabetes mellitus ([place])   . Urge incontinence   . Vertigo     Patient Active Problem List   Diagnosis Date Noted  . Pain in right wrist 02/27/2016  . Altered mental state 11/14/2015  . Severe dehydration 11/14/2015  . Hypernatremia 11/14/2015  . Difficulty in walking, not elsewhere classified   .  Right wrist fracture   . Closed displaced fracture of ulnar styloid with routine healing   . Scapholunate ligament injury with no instability   . Falls frequently   . Hypoxia 11/02/2015  . Anxiety 06/06/2015  . Impaired mobility 04/28/2015  . Lumbar spondylosis with myelopathy 03/07/2015  . Nocturia 12/03/2014  . Chronic venous insufficiency 09/23/2014  . Poor social situation 07/30/2014  . Schizophrenia, acute ([place])   . Schizoaffective disorder, unspecified type ([place])   . Decreased visual acuity 06/27/2014  . COPD exacerbation ([place])   . Right sided weakness 01/04/2014  . Essential hypertension, benign   . Atrial fibrillation, unspecified   . Radiculopathy of cervical region   . Polyuria  12/07/2013  . Erectile dysfunction 12/07/2013  . Diverticulitis 08/01/2013  . Acute diverticulitis 08/01/2013  . Hematochezia 06/04/2013  . Hypokalemia 10/03/2012  . Benign neoplasm of colon 09/27/2012  . Stricture and stenosis of esophagus 09/27/2012  . Diverticulitis of colon without hemorrhage 07/26/2012  . Polypharmacy 03/26/2012  . Seizures ([place]) 02/10/2012  . Atrial fibrillation ([place]) 02/05/2012  . Coronary atherosclerosis of native coronary artery 02/05/2012  . Radicular low back pain 03/09/2011  . History of seizure disorder 01/29/2011  . Irritable bowel syndrome (IBS) 06/24/2010  . Urge incontinence 06/19/2010  . TOBACCO USER 11/23/2008  . DEPRESSION, MAJOR 05/31/2008  . ISCHEMIC HEART DISEASE 05/31/2008  . RAYNAUD'S DISEASE 05/31/2008  . HIATAL HERNIA 05/31/2008  . Rheumatoid arthritis ([place]) 05/31/2008  . FIBROMYALGIA 05/31/2008  . Hyperlipidemia 01/26/2008  . Anxiety state 01/26/2008  . HEMORRHOIDS, INTERNAL 01/26/2008  . COPD (chronic obstructive pulmonary disease) ([place]) 01/26/2008  . ESOPHAGEAL MOTILITY DISORDER 01/26/2008  . GERD 01/26/2008  . Dermatomyositis ([place]) 01/26/2008    Past Surgical History:  Procedure Laterality Date  . BACK SURGERY    . CARDIAC CATHETERIZATION    . CARPAL TUNNEL RELEASE Bilateral   . CATARACT EXTRACTION W/ INTRAOCULAR LENS IMPLANT Left   . COLONOSCOPY N/A 09/27/2012   Procedure: COLONOSCOPY;  Surgeon: Lafayette Dragon, MD;  Location: [facility];  Service: Endoscopy;  Laterality: N/A;  . ESOPHAGOGASTRODUODENOSCOPY N/A 09/27/2012   Procedure: ESOPHAGOGASTRODUODENOSCOPY (EGD);  Surgeon: Lafayette Dragon, MD;  Location: [facility];  Service: Endoscopy;  Laterality: N/A;  . KNEE ARTHROSCOPY W/ MENISCAL REPAIR Left 2009  . LUMBAR DISC SURGERY    . LYMPH NODE DISSECTION Right 1970's   "neck; dr thought I had Hodgkins; turned out to be sarcoidosis" (11/15/2012)  . squamous papilloma   2010   removed by Dr. Constance Holster ENT, noted on tongue  .  TONSILLECTOMY         Home Medications    Prior to Admission medications   Medication Sig Start Date End Date Taking? Authorizing Provider  ALPRAZolam Duanne Moron) 1 MG tablet Take 1 mg by mouth 3 (three) times daily.   Yes Historical Provider, MD  amiodarone (PACERONE) 200 MG tablet Take 0.5 tablets (100 mg total) by mouth daily. 10/17/15  Yes [name], MD  amLODipine (NORVASC) 5 MG tablet Take 1 tablet (5 mg total) by mouth daily. 11/05/15  Yes Boothwyn Bing, DO  aspirin EC 81 MG tablet Take 1 tablet (81 mg total) by mouth daily. 10/17/15  Yes [name], MD  atorvastatin (LIPITOR) 40 MG tablet Take 1 tablet (40 mg total) by mouth daily. Patient taking differently: Take 40 mg by mouth at bedtime.  10/17/15   [name], MD  clonazePAM (KLONOPIN) 0.5 MG tablet Take 1 tablet (0.5 mg total) by  mouth daily. 11/18/15   Silver Huguenin Elgergawy, MD  cyanocobalamin (,VITAMIN B-12,) 1000 MCG/ML injection Please give IM once daily for 3 days, then once weekly for 4 weeks, then once monthly thereafter 11/18/15   Albertine Patricia, MD  divalproex (DEPAKOTE ER) 250 MG 24 hr tablet Take 3 tablets (750 mg total) by mouth daily. 12/15/15   [name], MD  doxycycline (VIBRA-TABS) 100 MG tablet Take 1 tablet (100 mg total) by mouth 2 (two) times daily. Patient not taking: Reported on 03/30/2016 01/30/16   Sela Hua, MD  folic acid (FOLVITE) 1 MG tablet Take 2 tablets (2 mg total) by mouth daily. 11/18/15   Silver Huguenin Elgergawy, MD  guaiFENesin (MUCINEX) 600 MG 12 hr tablet Take 1 tablet (600 mg total) by mouth 2 (two) times daily. OTC 10/17/15   [name], MD  ibuprofen (ADVIL,MOTRIN) 400 MG tablet Take 0.5 tablets (200 mg total) by mouth every 8 (eight) hours as needed for moderate pain. 12/12/15   [name], MD  ipratropium-albuterol (DUONEB) 0.5-2.5 (3) MG/3ML SOLN Inhale 3 mLs into the lungs every 6 (six) hours as needed. 11/17/15   Silver Huguenin Elgergawy, MD  lactulose (CHRONULAC) 10 GM/15ML solution Take  30 mLs (20 g total) by mouth 2 (two) times daily. Please titrate for 2-3 soft bowel movements per day 11/18/15   Silver Huguenin Elgergawy, MD  levETIRAcetam (KEPPRA) 500 MG tablet TAKE 1 TABLET(500 MG) BY MOUTH TWICE DAILY 10/17/15   [name], MD  loperamide (IMODIUM) 2 MG capsule TAKE 1 TO 2 CAPSULES BY MOUTH 3 TIMES A DAY AS NEEDED FOR DIARRHEA Patient taking differently: Take 2 mg by mouth every 8 (eight) hours as needed for diarrhea or loose stools.  08/20/15   [name], MD  meclizine (ANTIVERT) 25 MG tablet TAKE 1 TABLET (25 MG TOTAL) BY MOUTH 3 (THREE) TIMES DAILY AS NEEDED. Patient taking differently: Take 25 mg by mouth every 8 (eight) hours as needed for dizziness. TAKE 1 TABLET (25 MG TOTAL) BY MOUTH 3 (THREE) TIMES DAILY AS NEEDED. 10/17/15   [name], MD  Multiple Vitamin (MULTIVITAMIN WITH MINERALS) TABS tablet Take 1 tablet by mouth daily. 10/17/15   [name], MD  omeprazole (PRILOSEC) 20 MG capsule Take 20 mg by mouth daily.    Historical Provider, MD  Potassium Chloride ER 20 MEQ TBCR Take 1 tablet by mouth every other day.  11/29/14   Historical Provider, MD  predniSONE (DELTASONE) 50 MG tablet Take 1 tablet (50 mg total) by mouth daily with breakfast. 01/31/16   Sela Hua, MD  risperiDONE (RISPERDAL) 1 MG tablet Take 1 tablet (1 mg total) by mouth 2 (two) times daily. 12/15/15   [name], MD  senna-docusate (SENOKOT-S) 8.6-50 MG tablet Take 1 tablet by mouth at bedtime as needed for mild constipation. 11/17/15   Albertine Patricia, MD    Family History Family History  Problem Relation Age of Onset  . Alcohol abuse Mother   . Depression Mother   . Heart disease Mother   . Diabetes Mother   . Stroke Mother   . Coronary artery disease Father   . Diabetes Other     1/2 brother  . Hepatitis Brother     Social History Social History  Substance Use Topics  . Smoking status: Former Smoker    Packs/day: 0.50    Years: 30.00    Types: Cigarettes    Quit date:  12/15/2014  . Smokeless tobacco: Never Used     Comment: 3 cigs a day  . Alcohol use No     Comment: Alcohol stopped in September of 2014     Allergies   Immune globulins; Ciprofloxacin; Flagyl [metronidazole]; Lisinopril; Sulfa antibiotics; Requip [ropinirole hcl]; Betamethasone dipropionate; Bupropion hcl; Clobetasol; Codeine; Escitalopram oxalate; Fluoxetine hcl; Fluticasone-salmeterol; Furosemide; Paroxetine; Penicillins; Tacrolimus; Tetanus toxoid; and Tuberculin purified protein derivative   Review of Systems Review of Systems  Constitutional: Negative for chills and fever.  HENT: Negative for congestion and facial swelling.   Eyes: Negative for discharge and visual disturbance.  Respiratory: Negative for shortness of breath.   Cardiovascular: Negative for chest pain and palpitations.  Gastrointestinal: Negative for abdominal pain, diarrhea and vomiting.  Musculoskeletal: Positive for back pain. Negative for arthralgias and myalgias.  Skin: Negative for color change and rash.  Neurological: Negative for tremors, syncope and headaches.  Psychiatric/Behavioral: Negative for confusion and dysphoric mood.     Physical Exam Updated Vital Signs BP 139/85 (BP Location: Left Arm)   Pulse 87   Temp 97.9 F (36.6 C)   Resp 18   Ht 5\' 9"  (1.753 m)   Wt 214 lb (97.1 kg)   SpO2 97%   BMI 31.60 kg/m   Physical Exam  Constitutional: He is oriented to person, place, and time. He appears well-developed and well-nourished.  HENT:  Head: Normocephalic and atraumatic.  Eyes: EOM are normal. Pupils are equal, round, and reactive to light.  Neck: Normal range of motion. Neck supple. No JVD present.  Cardiovascular: Normal rate and regular rhythm.  Exam reveals no gallop and no friction rub.   No murmur heard. Pulmonary/Chest: No respiratory distress. He has no wheezes.  Abdominal: He exhibits no distension. There is no tenderness. There is no rebound and no guarding.    Musculoskeletal: Normal range of motion. He exhibits tenderness (diffuse lower back pain).  PMS intact to BLE, no noted clonus, reflexes 2+  Neurological: He is alert and oriented to person, place, and time.  Skin: No rash noted. No pallor.  Psychiatric: He has a normal mood and affect. His behavior is normal.  Nursing note and vitals reviewed.    ED Treatments / Results  Labs (all labs ordered are listed, but only abnormal results are displayed) Labs Reviewed  I-STAT CHEM 8, ED - Abnormal; Notable for the following:       Result Value   Sodium 147 (*)    Glucose, Bld 111 (*)    All other components within normal limits  CBC WITH DIFFERENTIAL/PLATELET    EKG  EKG Interpretation  Date/Time:  Tuesday March 30 2016 10:48:19 EST Ventricular Rate:  122 PR Interval:    QRS Duration: 70 QT Interval:  376 QTC Calculation: 536 R Axis:   10 Text Interpretation:  Atrial fibrillation Borderline repolarization abnormality Prolonged QT interval No significant change since last tracing Confirmed by Rochell Puett MD, DANIEL [phone]) on 03/30/2016 11:14:25 AM       Radiology Ct L-spine No Charge  Result Date: 03/30/2016 CLINICAL DATA:  Low back and bilateral leg pain. The patient reports he is unable to walk. No known injury. EXAM: CT LUMBAR SPINE WITHOUT CONTRAST TECHNIQUE: Multidetector CT imaging of the lumbar spine was performed without intravenous contrast administration. Multiplanar CT image reconstructions were also generated. COMPARISON:  MRI lumbar spine 07/26/2014. FINDINGS: Segmentation: Standard. Alignment: 0.9 cm anterolisthesis L5 on S1 is due to advanced facet degenerative change. Alignment is otherwise maintained. Vertebrae: No  fracture or worrisome lesion is identified. Paraspinal and other soft tissues: Extensive aortoiliac atherosclerosis without aneurysm is identified. Sigmoid diverticulosis is noted. Disc levels: T10-11:  Negative. T11-12: Negative. T12-L1: Mild facet  degenerative change is worse on the right. The central canal and foramina appear open. L1-2: Mild disc bulge and facet degenerative disease. The central canal and foramina are open. L2-3: Shallow disc bulge to the right. There is mild central canal and right foraminal narrowing. The appearance is not grossly changed. L3-4: Shallow disc bulge and facet degenerative disease. Mild to moderate central canal narrowing is seen. The foramina appear open. L4-5:  Minimal disc bulge without central canal foraminal narrowing. L5-S1: The disc is uncovered. Anterolisthesis results in mild to moderate bilateral foraminal narrowing. IMPRESSION: No marked change in the appearance lumbar spine compared to prior MRI. 0.9 cm anterolisthesis L5 on S1. The central canal is open. Moderate bilateral foraminal narrowing due to anterolisthesis is noted. Electronically Signed   By: Inge Rise M.D.   On: 03/30/2016 11:34   Ct Renal Stone Study  Result Date: 03/30/2016 CLINICAL DATA:  Right low back pain. EXAM: CT ABDOMEN AND PELVIS WITHOUT CONTRAST TECHNIQUE: Multidetector CT imaging of the abdomen and pelvis was performed following the standard protocol without IV contrast. COMPARISON:  12/30/2014 FINDINGS: Lower chest: Dependent bibasilar atelectasis. Heart is normal size. No effusions. Calcifications in the visualize coronary arteries, mitral valve and aortic valve. Hepatobiliary: No focal hepatic abnormality. Gallbladder unremarkable. Pancreas: No focal abnormality or ductal dilatation. Spleen: No focal abnormality.  Normal size. Adrenals/Urinary Tract: No adrenal abnormality. No focal renal abnormality. No hydronephrosis. A few small punctate renal stones on the left. Urinary bladder is unremarkable. Stomach/Bowel: Diffuse aortic and iliac calcifications. No aneurysm or adenopathy. Vascular/Lymphatic: Sigmoid diverticulosis. No active diverticulitis. Stomach and small bowel unremarkable. Appendix is normal. Reproductive: No  visible focal abnormality. Other: No free fluid or free air. Musculoskeletal: No acute bony abnormality. Probable bilateral L5 pars defects with grade 1 anterolisthesis. Advanced degenerative facet disease. IMPRESSION: Punctate left nephrolithiasis. No ureteral stones or hydronephrosis. Sigmoid diverticulosis. Aortoiliac atherosclerosis.  Coronary artery disease. No acute findings in the abdomen or pelvis. Electronically Signed   By: Rolm Baptise M.D.   On: 03/30/2016 11:19    Procedures Procedures (including critical care time)  Medications Ordered in ED Medications  ketorolac (TORADOL) injection 30 mg (30 mg Intramuscular Given 03/30/16 1000)  metoprolol (LOPRESSOR) injection 10 mg (10 mg Intravenous Given 03/30/16 1123)     Initial Impression / Assessment and Plan / ED Course  I have reviewed the triage vital signs and the nursing notes.  Pertinent labs & imaging results that were available during my care of the patient were reviewed by me and considered in my medical decision making (see chart for details).     66 yo M With a chief complaint of low back pain. Per EMS this is a chronic though I do not see any notes or imaging in our system. We'll obtain a imaging study secondary to age and limited history secondary to his underlying schizophrenia.   Imaging with no significant findings.  Discussed results with patient.  D/c home.   4:48 PM:  I have discussed the diagnosis/risks/treatment options with the patient and family and believe the pt to be eligible for discharge home to follow-up with PCP. We also discussed returning to the ED immediately if new or worsening sx occur. We discussed the sx which are most concerning (e.g., sudden worsening pain, fever, inability to tolerate  by mouth) that necessitate immediate return. Medications administered to the patient during their visit and any new prescriptions provided to the patient are listed below.  Medications given during this  visit Medications  ketorolac (TORADOL) injection 30 mg (30 mg Intramuscular Given 03/30/16 1000)  metoprolol (LOPRESSOR) injection 10 mg (10 mg Intravenous Given 03/30/16 1123)     The patient appears reasonably screen and/or stabilized for discharge and I doubt any other medical condition or other [facility] requiring further screening, evaluation, or treatment in the ED at this time prior to discharge.    Final Clinical Impressions(s) / ED Diagnoses   Final diagnoses:  Acute bilateral low back pain without sciatica    New Prescriptions Discharge Medication List as of 03/30/2016 12:16 PM       Deno Etienne, DO 03/30/16 1648

## 2016-03-31 ENCOUNTER — Encounter (INDEPENDENT_AMBULATORY_CARE_PROVIDER_SITE_OTHER): Payer: Self-pay | Admitting: *Deleted

## 2016-03-31 NOTE — Telephone Encounter (Signed)
This encounter was created in error - please disregard.

## 2016-04-01 ENCOUNTER — Ambulatory Visit: Payer: Medicare Other | Admitting: Internal Medicine

## 2016-04-05 IMAGING — CT CT ABD-PELV W/ CM
1 of 3 series · 13 of 32 positions shown, 18 images · IV contrast (omnipaque)
Comparison: CT of the abdomen and pelvis August 01, 2013

CLINICAL DATA: Abdominal pain, urinary retention.

EXAM:
CT ABDOMEN AND PELVIS WITH CONTRAST
TECHNIQUE: Multidetector CT imaging of the abdomen and pelvis was performed
using the standard protocol following bolus administration of
intravenous contrast.
CONTRAST:  100mL OMNIPAQUE IOHEXOL 300 MG/ML  SOLN

[Series 2: abd/pel with · axial · 0.83mm/px · z∈[+1276,+1626]mm · 13 of 80 slices shown, 18 images]
[im 5/80  soft-tissue]
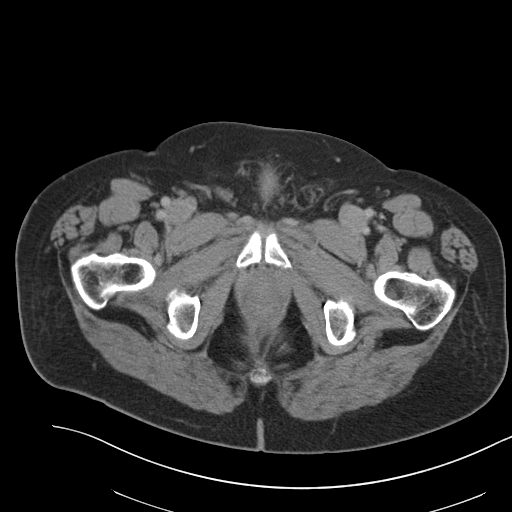
[im 5/80  bone]
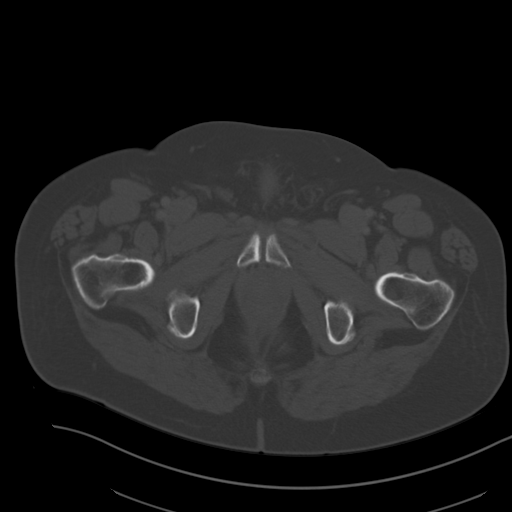
[im 13/80  soft-tissue]
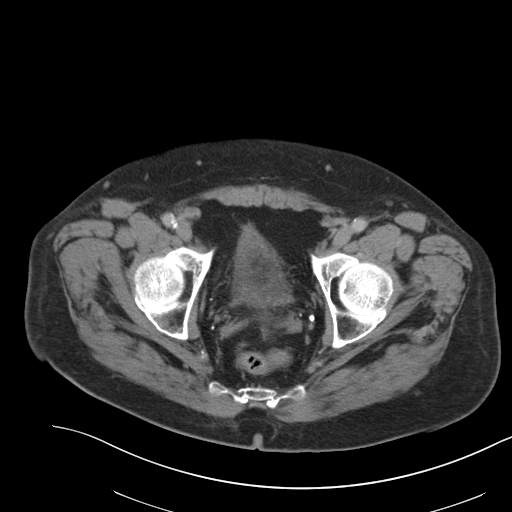
[im 17/80  soft-tissue]
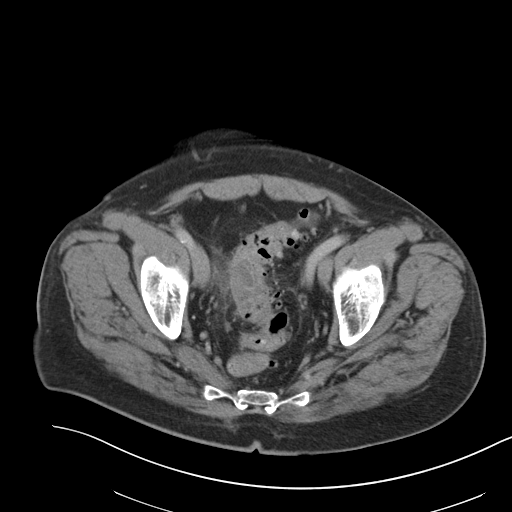
[im 25/80  soft-tissue]
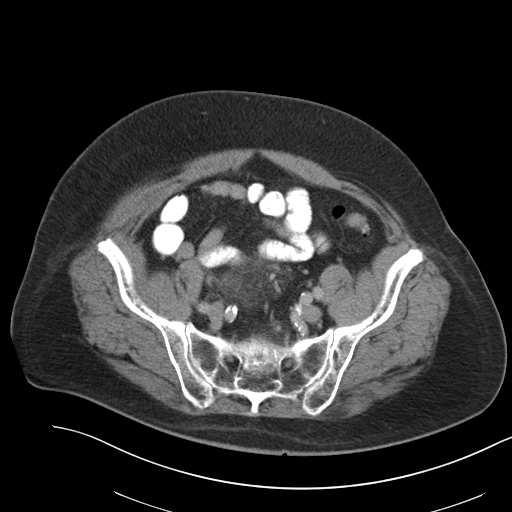
[im 30/80  soft-tissue]
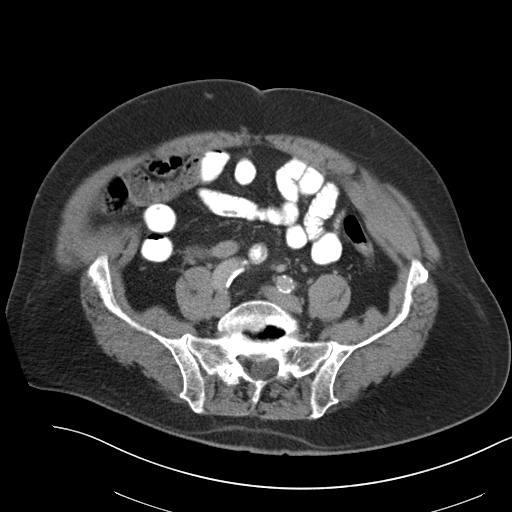
[im 38/80  soft-tissue]
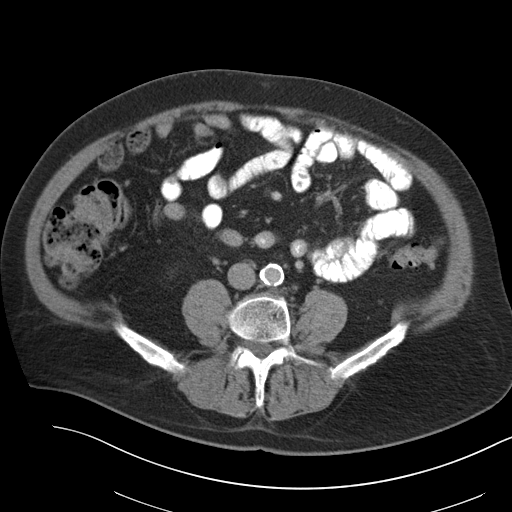
[im 42/80  soft-tissue]
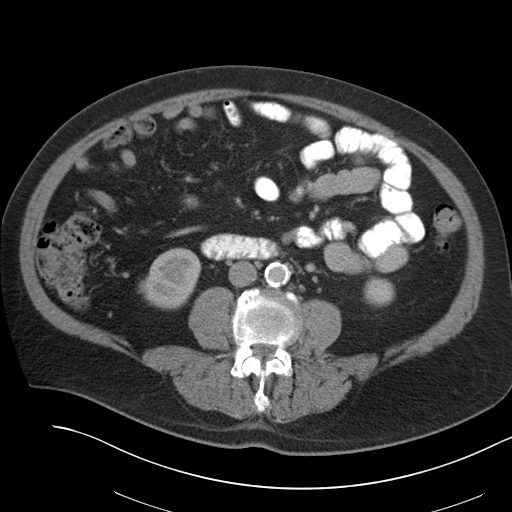
[im 50/80  soft-tissue]
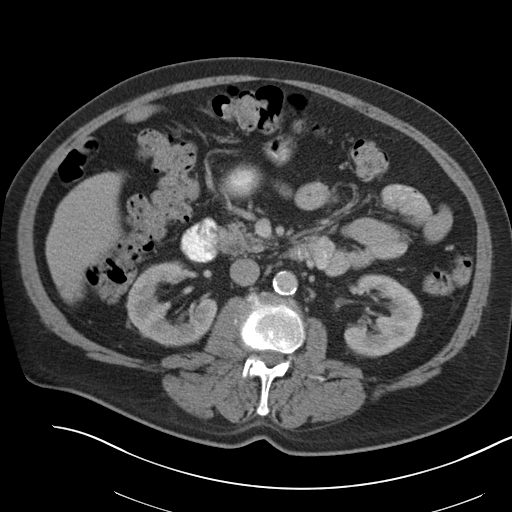
[im 55/80  soft-tissue]
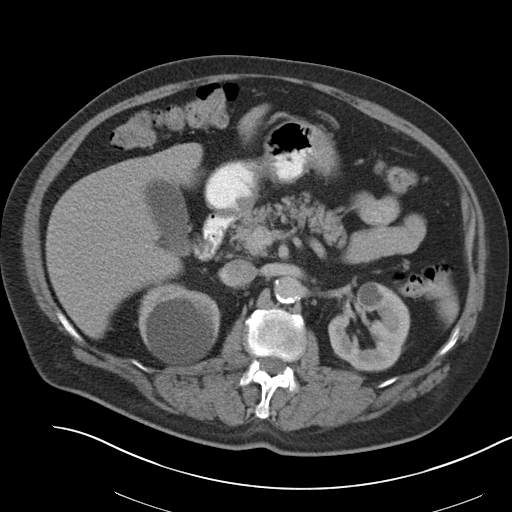
[im 55/80  bone]
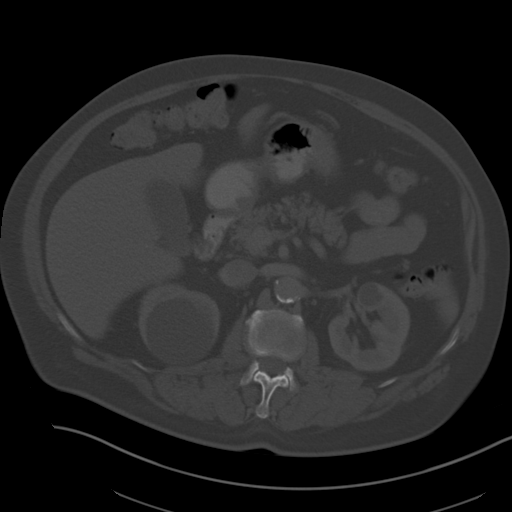
[im 63/80  soft-tissue]
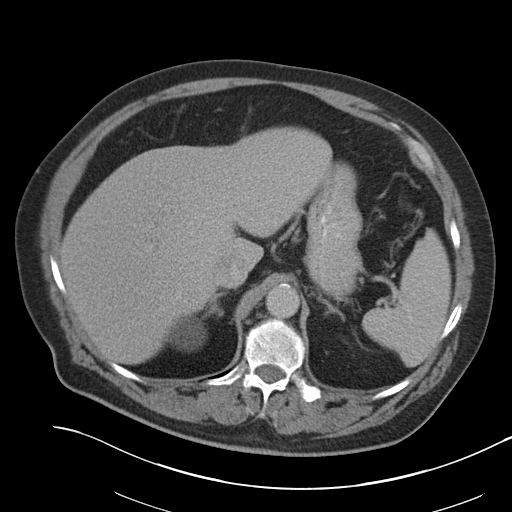
[im 63/80  lung]
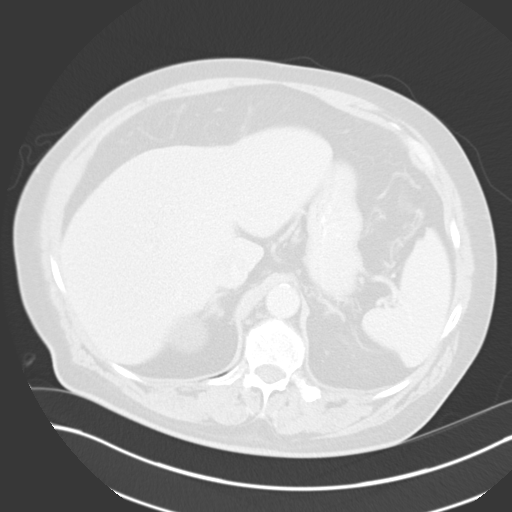
[im 67/80  soft-tissue]
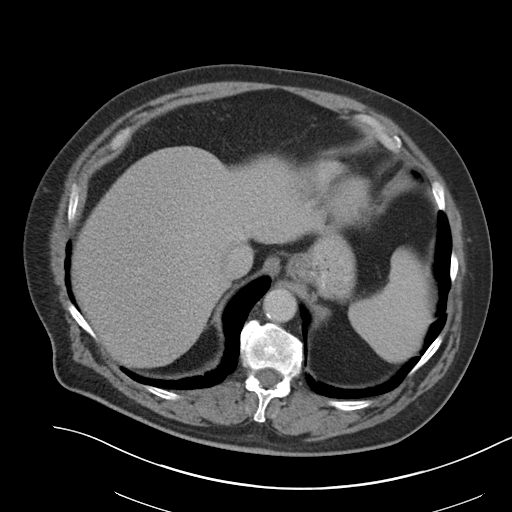
[im 67/80  lung]
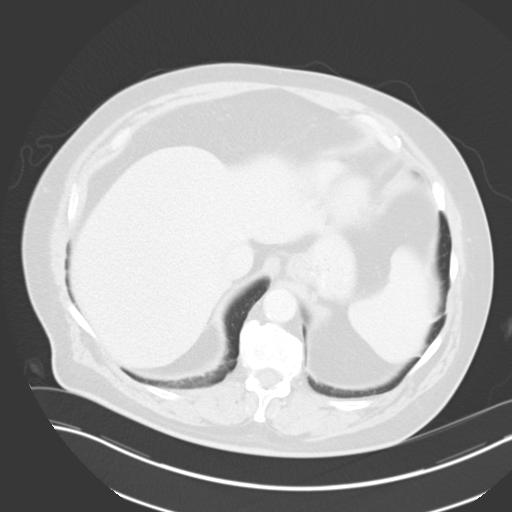
[im 71/80  lung]
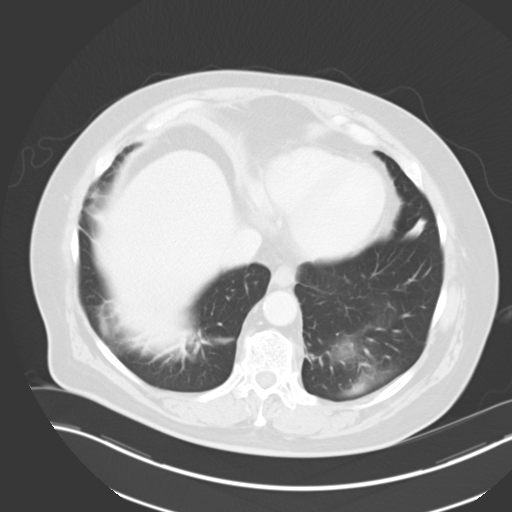
[im 75/80  soft-tissue]
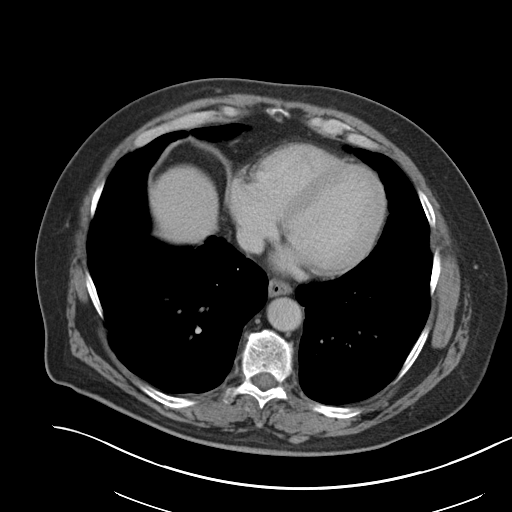
[im 75/80  lung]
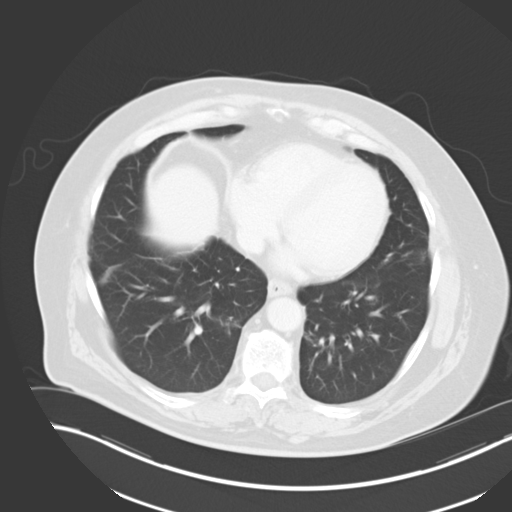

[13 of 32 positions shown; findings below may reference images not displayed]

FINDINGS: LUNG BASES: Included view of the lung bases demonstrate bilateral
lower lobe atelectasis. Visualized heart and pericardium are
unremarkable.

SOLID ORGANS: The liver, spleen, gallbladder, pancreas and adrenal
glands are unremarkable.

GASTROINTESTINAL TRACT: Colonic diverticulosis with superimposed
short segment of sigmoid inflammation, trace free fluid. The
stomach, small bowel are normal in course and caliber without
inflammatory changes. Enteric contrast has not yet reached the
distal small bowel. Normal appendix.

KIDNEYS/ URINARY TRACT: Kidneys are orthotopic, demonstrating
symmetric enhancement. No nephrolithiasis, hydronephrosis or solid
renal masses. Stable 6.3 cm right upper pole renal cyst, additional
smaller right renal cyst. 19 mm left renal cyst. Additional too
small to characterize hypodensities bilaterally. The unopacified
ureters are normal in course and caliber. Delayed imaging through
the kidneys demonstrates symmetric prompt contrast excretion within
the proximal urinary collecting system. Urinary bladder is partially
distended and unremarkable.

PERITONEUM/RETROPERITONEUM: No intraperitoneal abscess nor free air.
Focal ectasia of the infrarenal aorta without aneurysm ; moderate to
severe calcific atherosclerosis of the aortoiliac vessels. No
lymphadenopathy by CT size criteria. Internal reproductive organs
are unremarkable. Phleboliths in the pelvis.

SOFT TISSUE/OSSEOUS STRUCTURES: Nonsuspicious. Follow-up fat
containing inguinal hernia. Grade 1/2 L5-S1 anterolisthesis, left L5
pars interarticularis defect.
IMPRESSION: Acute recurrent sigmoid diverticulitis without bowel perforation,
abscess nor bowel obstruction.

  By: Gotthard Almohammad

## 2016-06-30 IMAGING — DX DG CHEST 2V
2 series · 2 of 2 positions shown · non-contrast
Comparison: 05/11/2013

CLINICAL DATA: Left-sided weakness. Shortness of breath. Chest
pain.

EXAM:
CHEST  2 VIEW

[chest pa]
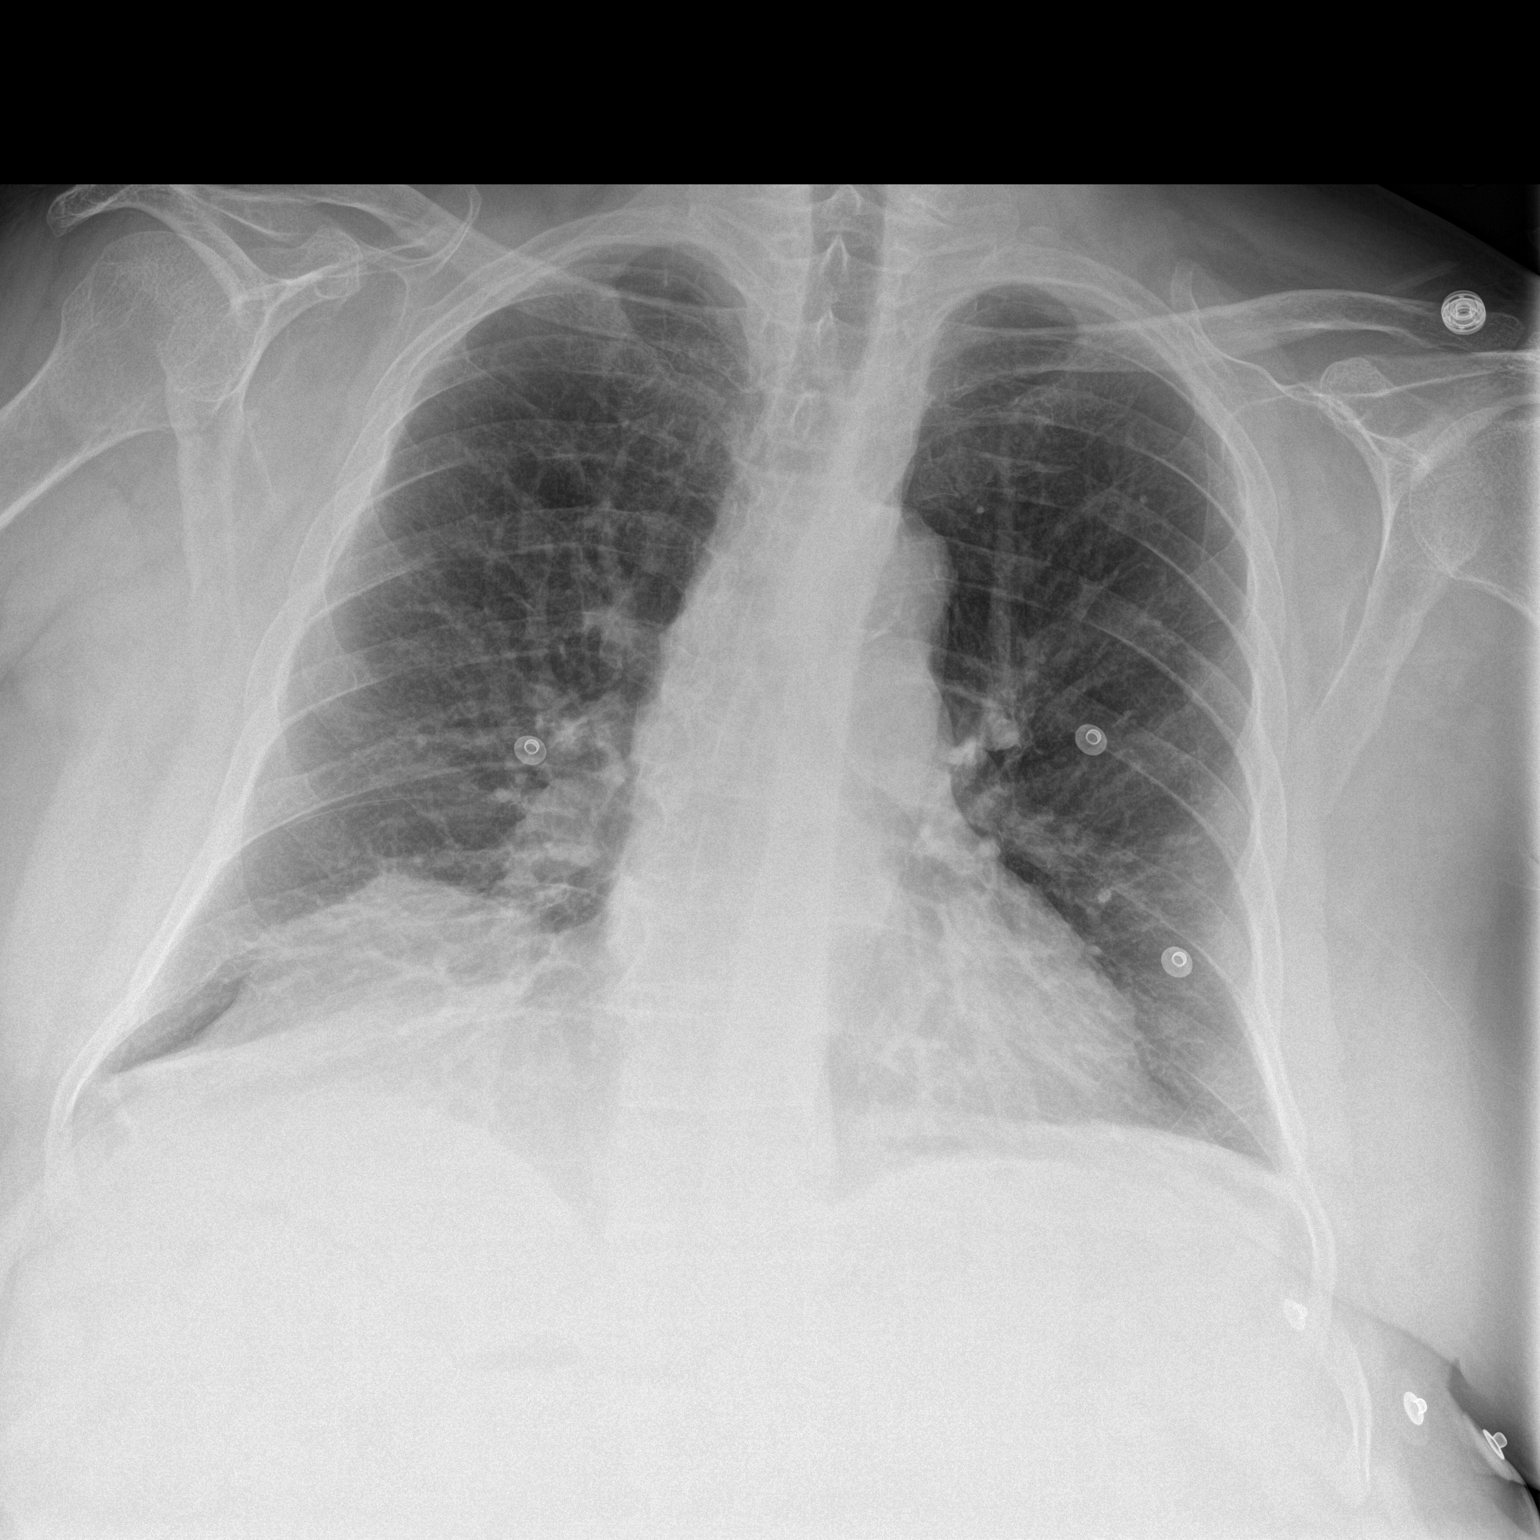

[chest lat]
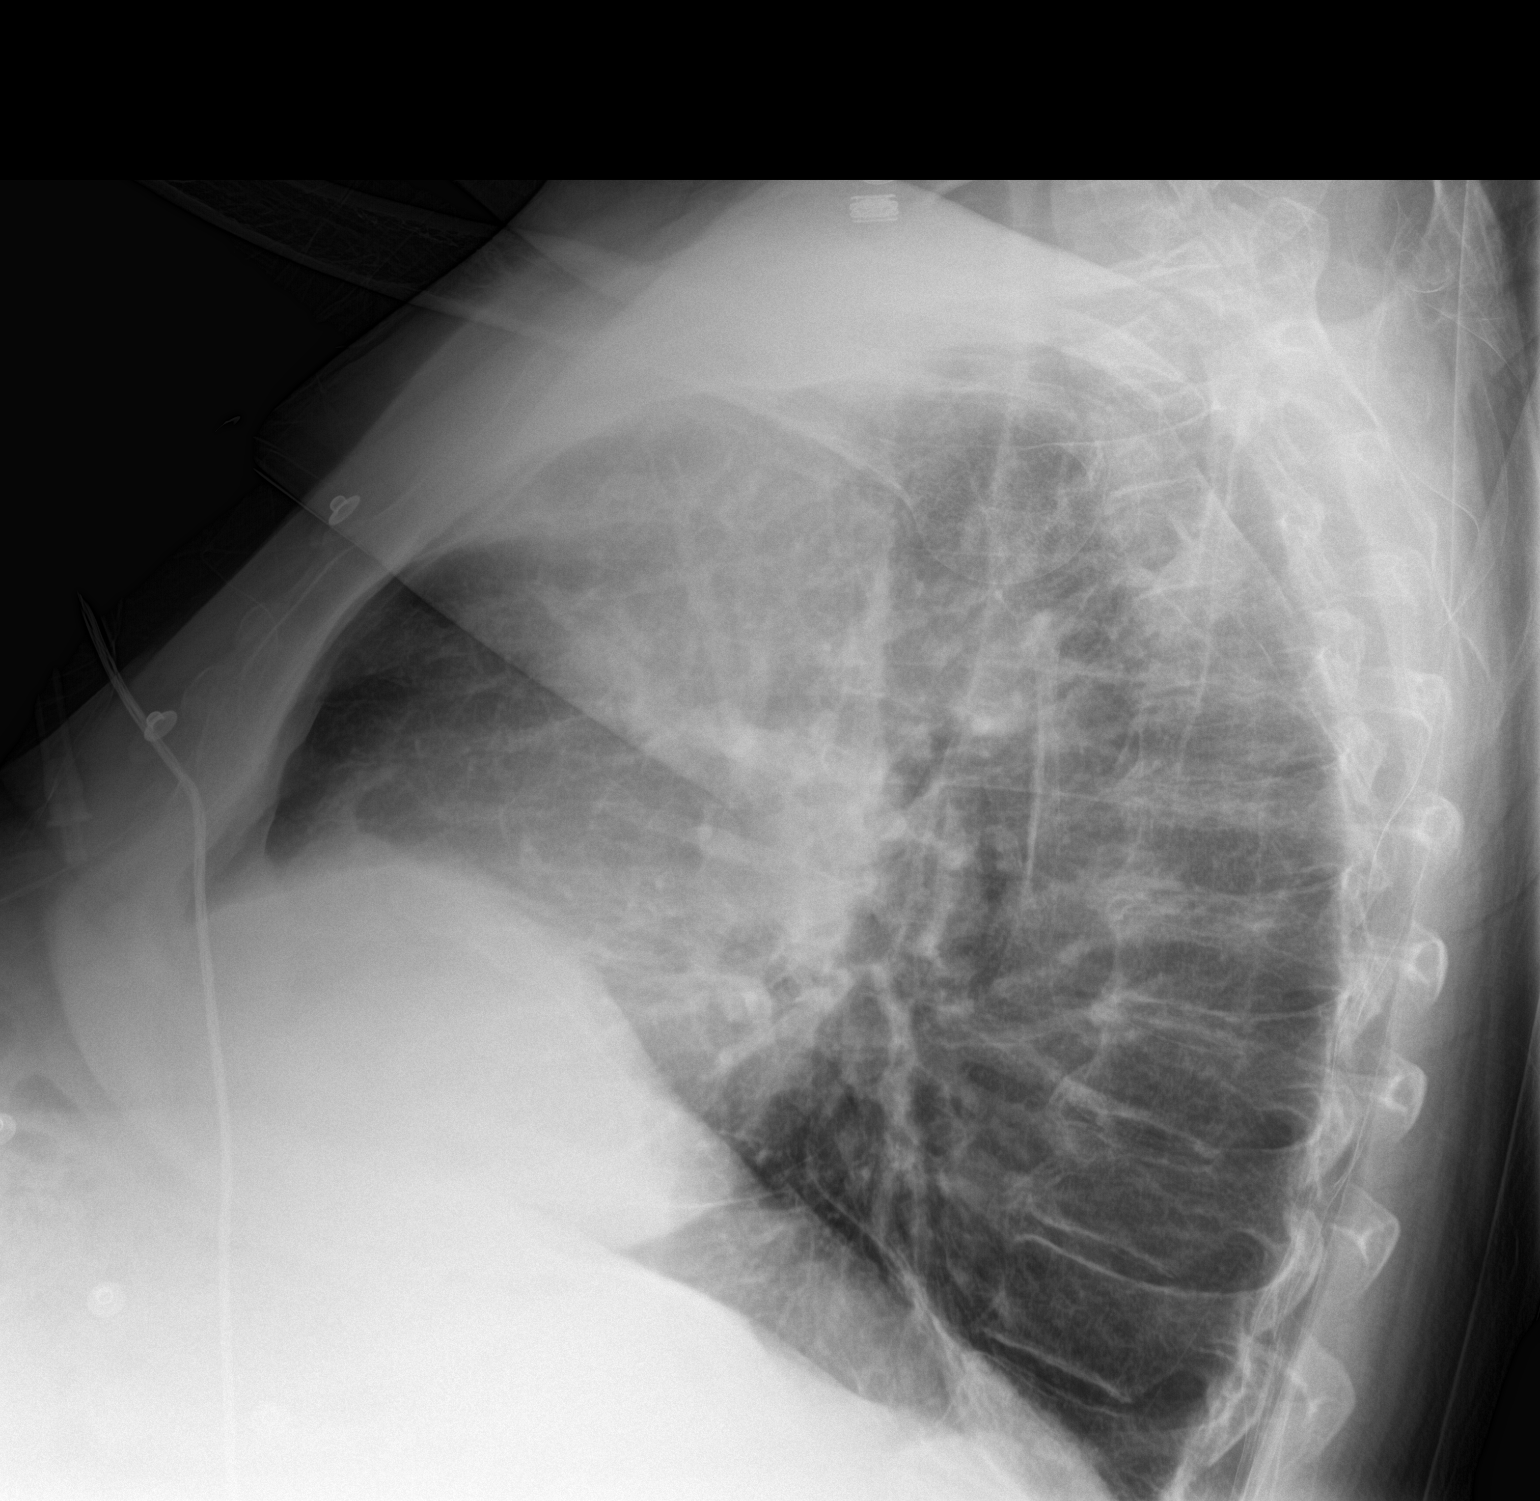

[2 of 2 positions shown; findings below may reference images not displayed]

FINDINGS: Segmental elevation of the right hemidiaphragm, chronic. Mild
hyperinflation. Normal heart size and pulmonary vascularity. No
focal airspace disease in the lungs. No blunting of costophrenic
angles. No pneumothorax. Calcified and tortuous aorta. Degenerative
changes in the spine.
IMPRESSION: No active cardiopulmonary disease.

## 2016-06-30 IMAGING — CT CT CERVICAL SPINE W/O CM
3 of 6 series · 10 of 33 positions shown, 12 images · non-contrast
Comparison: CT head 11/08/2012. CT head and cervical spine
10/16/2012.

CLINICAL DATA: Right arm and hand weakness with right-sided neck
pain and right-sided headache for 3 weeks. Pain worsening today.

EXAM:
CT HEAD WITHOUT CONTRAST
CT CERVICAL SPINE WITHOUT CONTRAST
TECHNIQUE: Multidetector CT imaging of the head and cervical spine was
performed following the standard protocol without intravenous
contrast. Multiplanar CT image reconstructions of the cervical spine
were also generated.

[Series 304: orthog · axial · 0.36mm/px · z∈[+65,+116]mm · 2 of 86 slices shown, 3 images]
[im 29/86  soft-tissue]
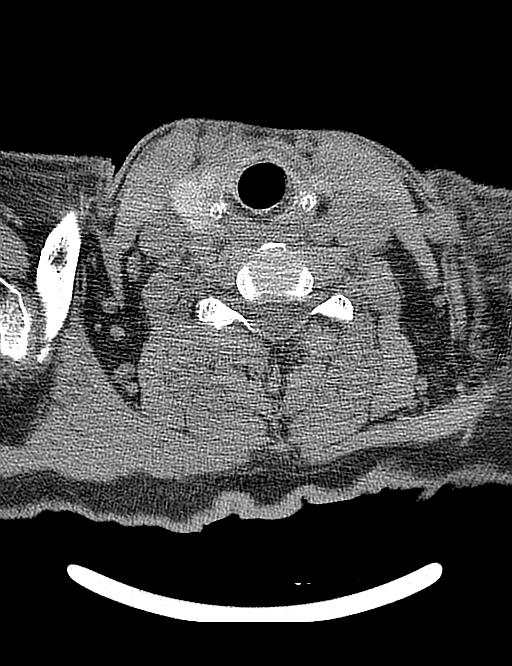
[im 29/86  bone]
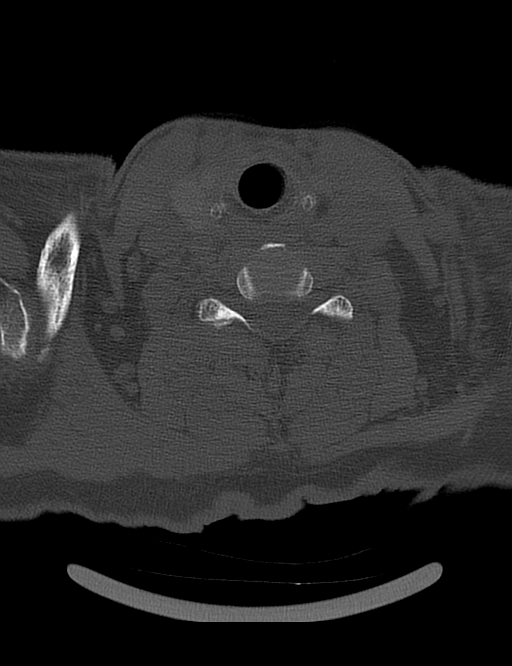
[im 57/86  bone]
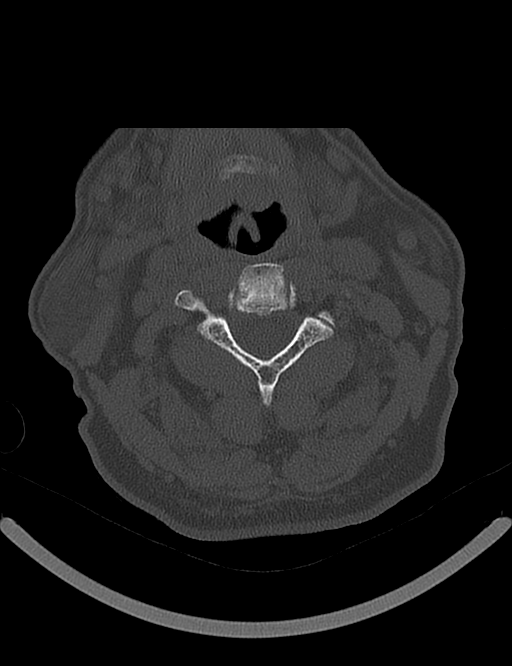

[Series 305: coronal · coronal · 0.36mm/px · 3 of 51 slices shown]
[im 11/51  bone]
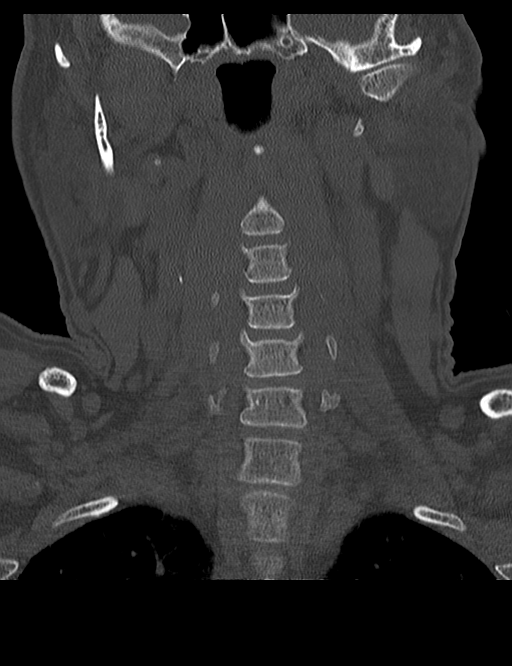
[im 21/51  bone]
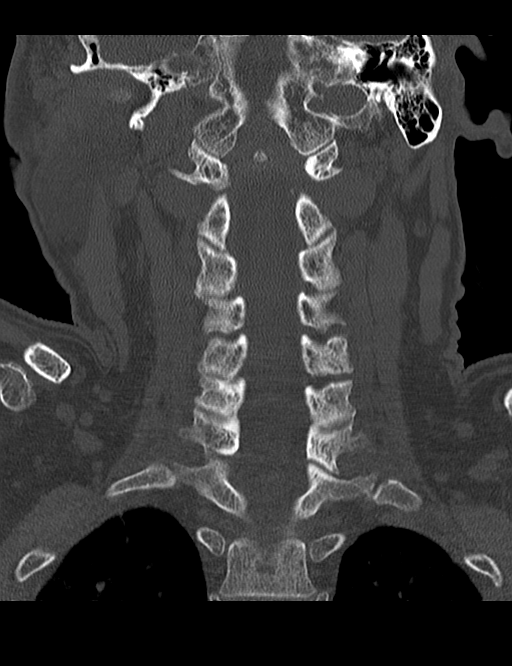
[im 31/51  bone]
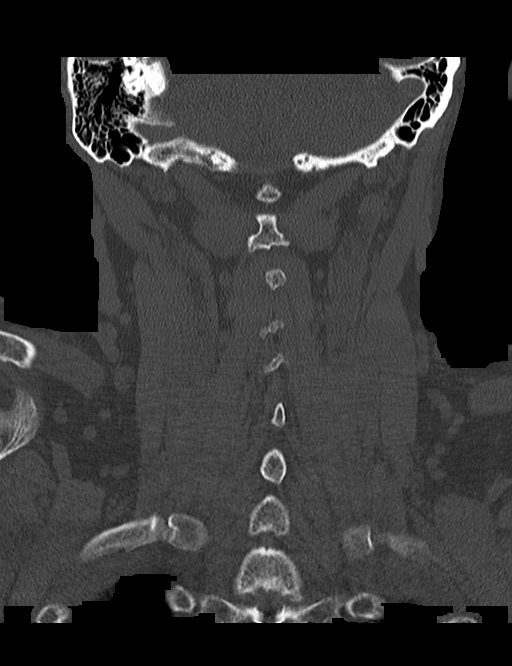

[Series 306: sag · sagittal · 0.36mm/px · 5 of 61 slices shown, 6 images]
[im 21/61  bone]
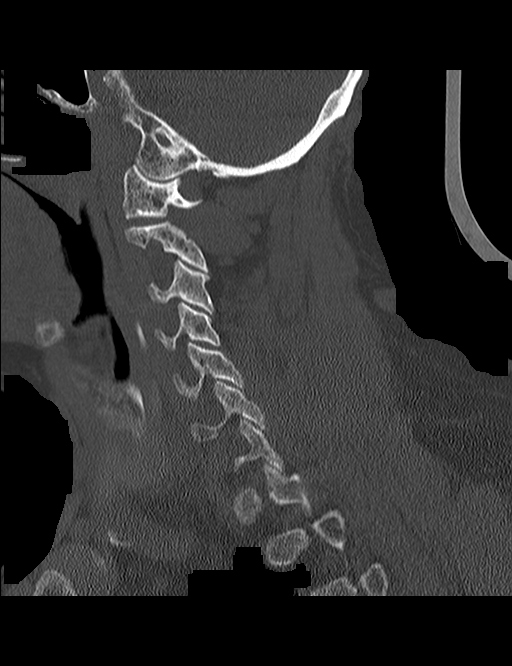
[im 26/61  bone]
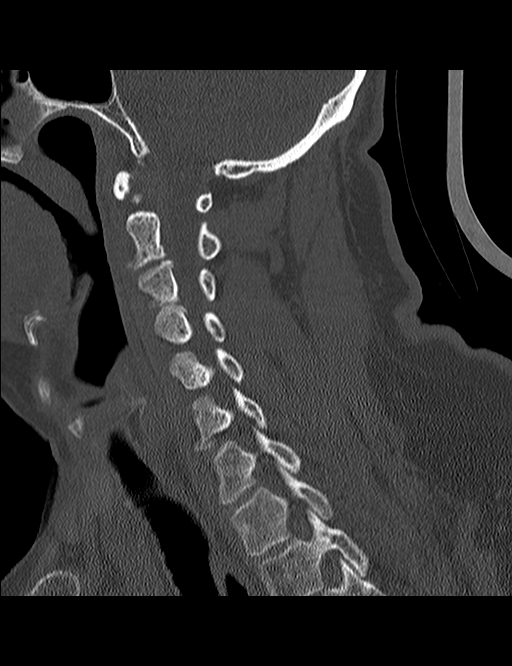
[im 31/61  soft-tissue]
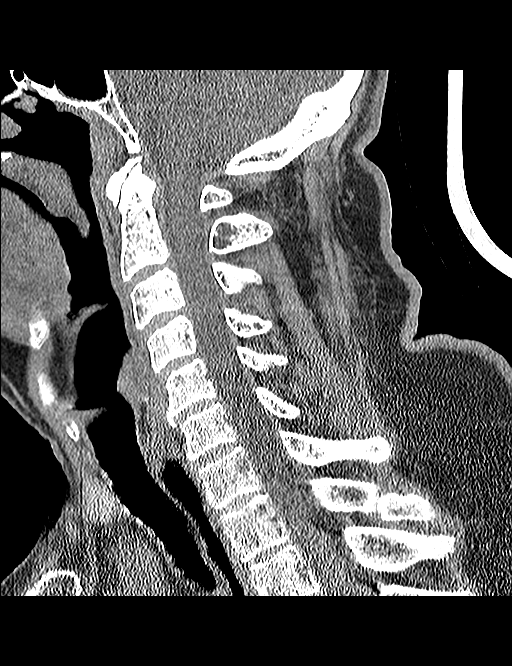
[im 31/61  bone]
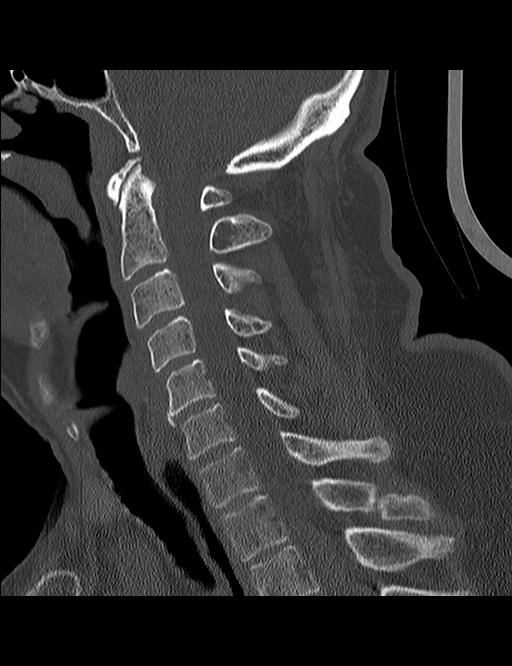
[im 36/61  bone]
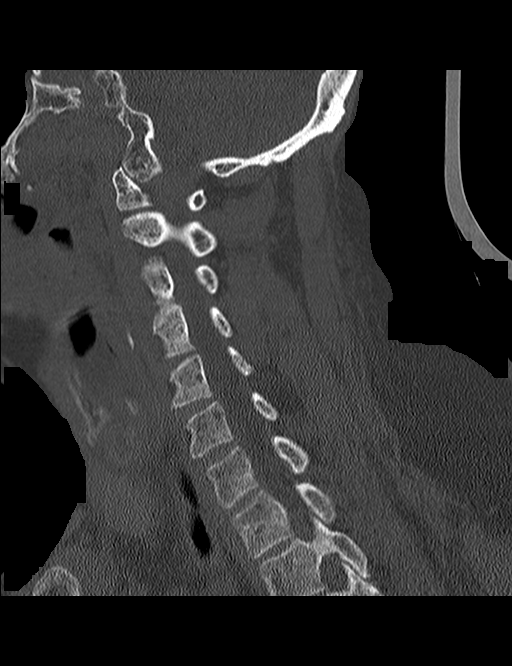
[im 41/61  bone]
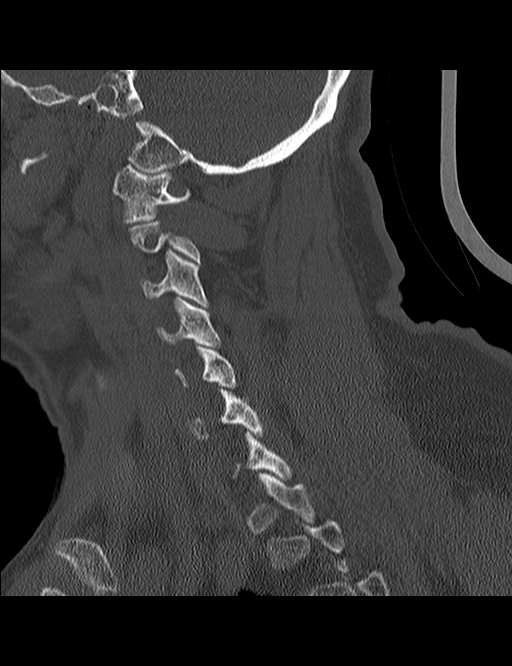

[10 of 33 positions shown; findings below may reference images not displayed]

FINDINGS: CT HEAD FINDINGS

Diffuse cerebral atrophy. No ventricular dilatation. Low-attenuation
changes throughout the deep white matter consistent with small
vessel ischemia. No change in pattern since previous study. No mass
effect or midline shift. No abnormal extra-axial fluid collections.
Gray-white matter junctions are distinct. Basal cisterns are not
effaced. No evidence of acute intracranial hemorrhage. No depressed
skull fractures. Mucosal thickening in the paranasal sinuses.
Opacification of some of the ethmoid air cells. Mastoid air cells
are not opacified. Vascular calcifications.

CT CERVICAL SPINE FINDINGS

Normal alignment of the cervical spine and facet joints.
Degenerative changes in the cervical spine with narrowed interspace
and hypertrophic changes mostly at C5-6 level. Intervertebral disc
space heights are otherwise normal. No prevertebral soft tissue
swelling. No vertebral compression fractures. Normal alignment of
the facet joints. Old fracture of the spinous process of T1 is
unchanged since prior study. C1-2 articulation appears intact. No
focal bone lesion or bone destruction. Vascular calcifications in
the cervical carotid arteries. Soft tissues are otherwise
unremarkable. Suggestion of infiltration or atelectasis in the right
lung apex. Emphysematous changes in the lung apices.
IMPRESSION: No acute intracranial abnormalities. Chronic atrophy and small
vessel ischemic changes.

Normal alignment of the cervical spine. Degenerative changes. Old
fracture of the T1 spinous process. No acute displaced fractures
appreciated.

## 2016-07-01 IMAGING — MR MR HEAD W/O CM
9 of 11 series · 27 of 48 positions shown · non-contrast
Comparison: Head CT 01/03/2014 and MRI 02/04/2012

CLINICAL DATA: Three weeks of right-sided weakness and sensory
loss.

EXAM:
MRI HEAD WITHOUT CONTRAST
TECHNIQUE: Multiplanar, multiecho pulse sequences of the brain and surrounding
structures were obtained without intravenous contrast.

[Series 2: FLAIR · sagittal · 5.0mm · 0.47mm/px · 1 of 28 slices shown (1 of 2)]
[im 1/28]
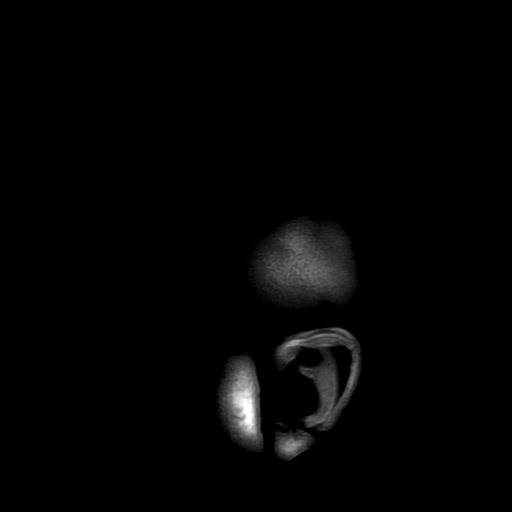

[Series 4: DWI · axial · 5.0mm · 1.02mm/px · z∈[-120,+30]mm · 4 of 62 slices shown (1 of 4)]
[im 1/62]
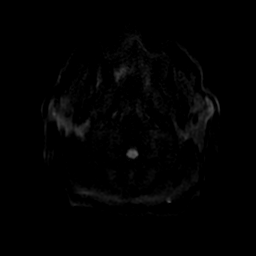
[im 21/62]
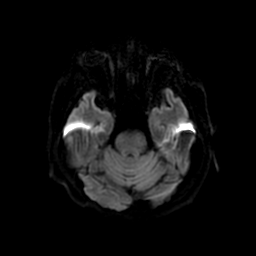
[im 41/62]
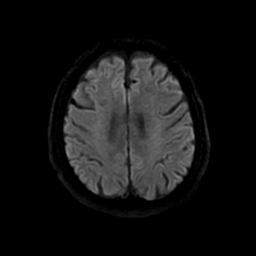
[im 62/62]
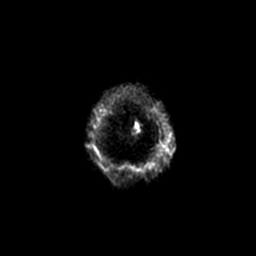

[Series 5: T2 · axial · 5.0mm · 0.43mm/px · z∈[-122,+34]mm · 2 of 27 slices shown]
[im 1/27]
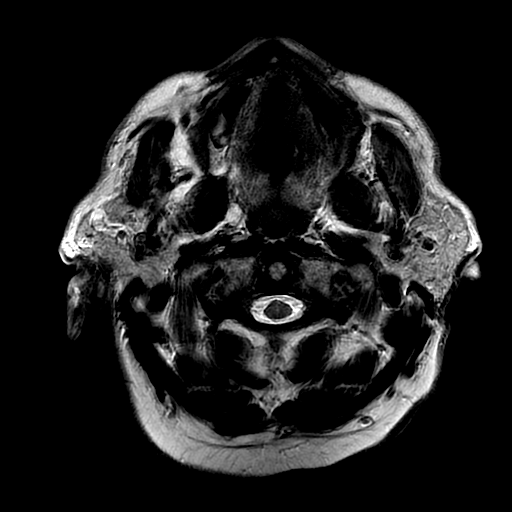
[im 27/27]
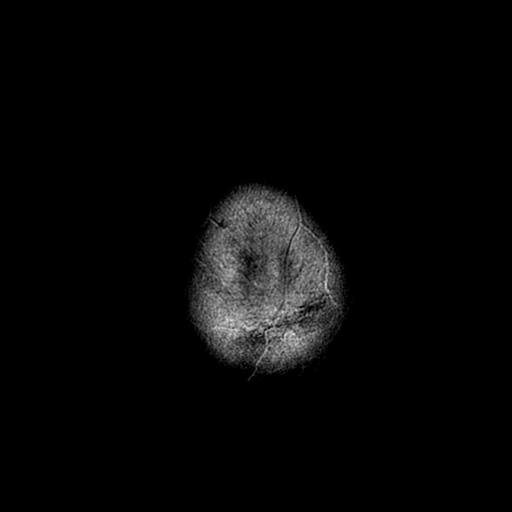

[Series 6: FLAIR · axial · 5.0mm · 0.43mm/px · z∈[-122,+34]mm · 2 of 27 slices shown (2 of 2)]
[im 1/27]
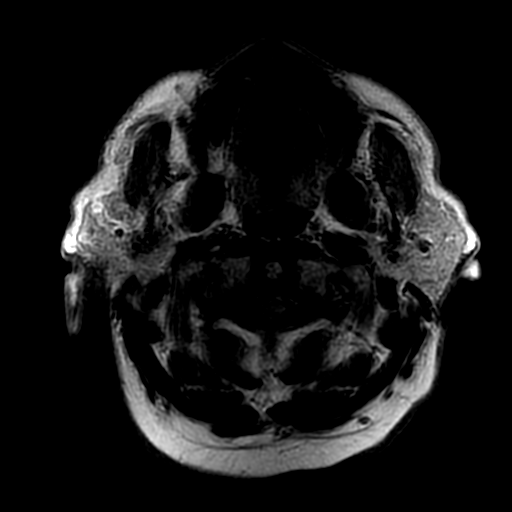
[im 27/27]
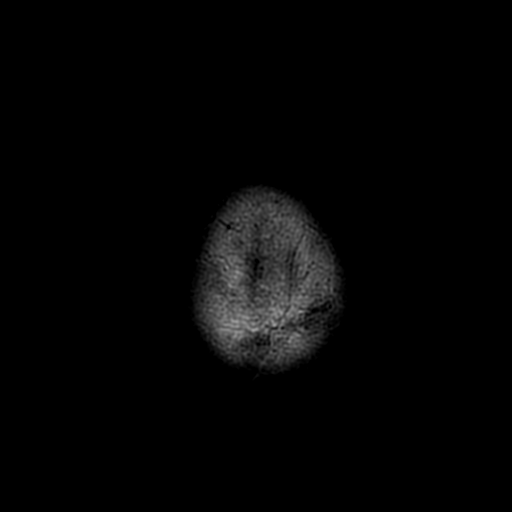

[Series 7: DWI · coronal · 5.0mm · 1.02mm/px · 4 of 66 slices shown (2 of 4)]
[im 1/66]
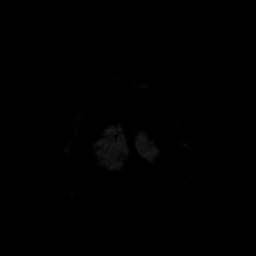
[im 22/66]
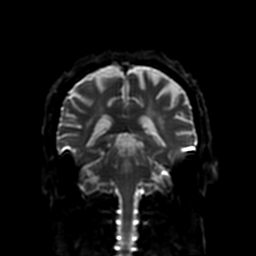
[im 44/66]
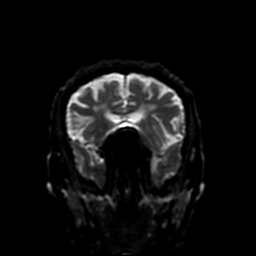
[im 66/66]
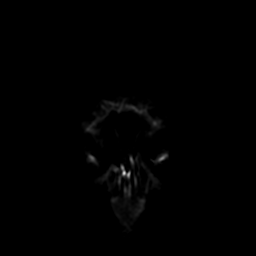

[Series 8: (person_name) · axial · 3.6mm · 0.47mm/px · z∈[-123,+34]mm · 8 of 176 slices shown]
[im 1/176]
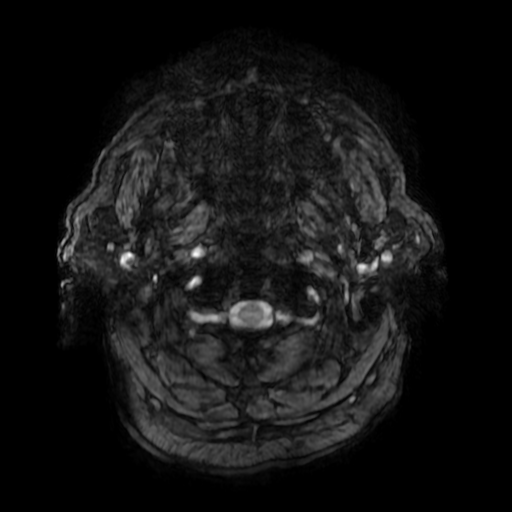
[im 32/176]
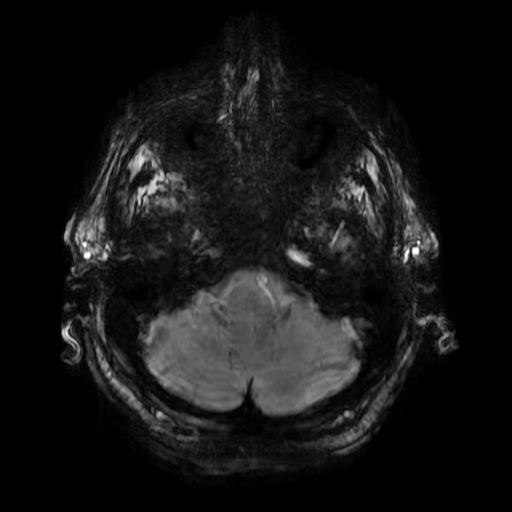
[im 48/176]
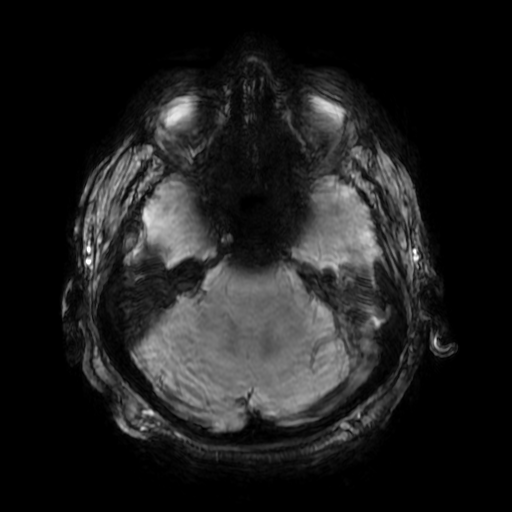
[im 80/176]
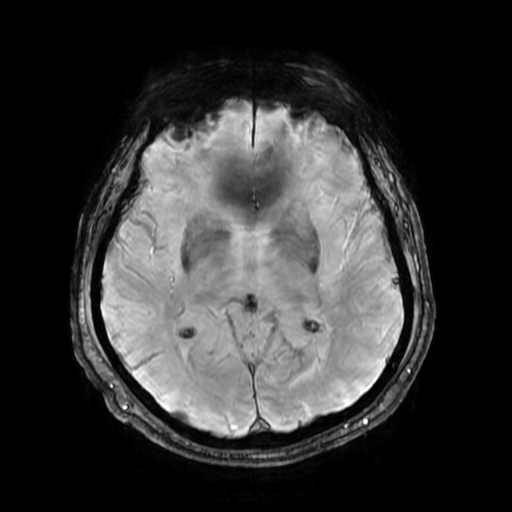
[im 96/176]
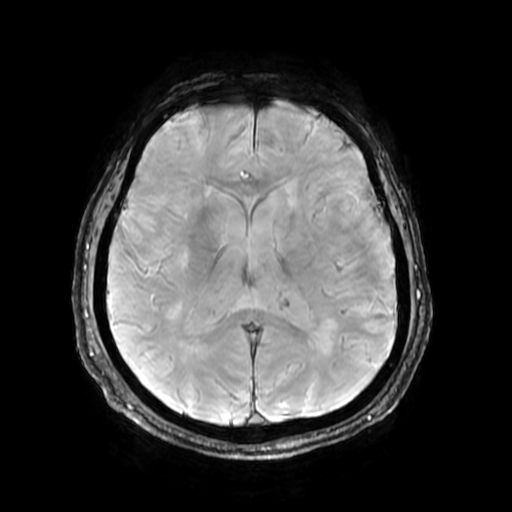
[im 128/176]
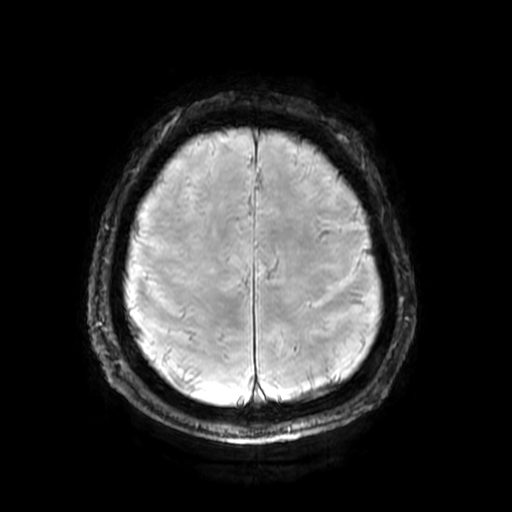
[im 144/176]
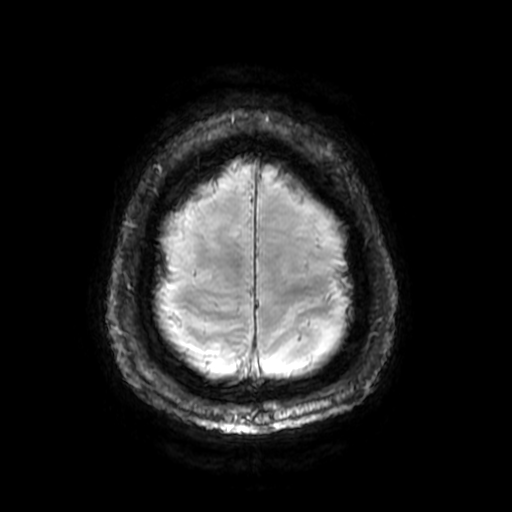
[im 176/176]
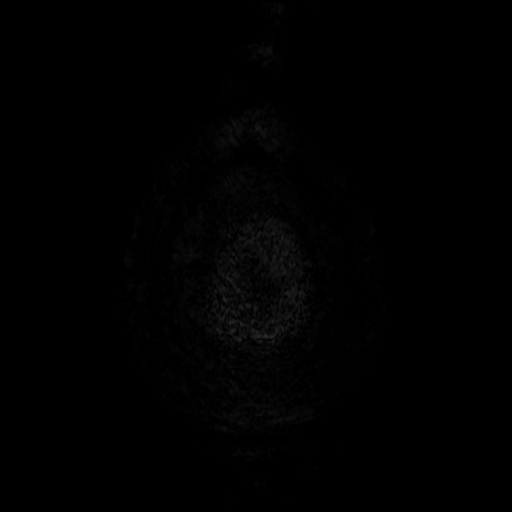

[Series 10: T2 post-contrast · coronal · 5.0mm · 0.47mm/px · 2 of 28 slices shown]
[im 1/28]
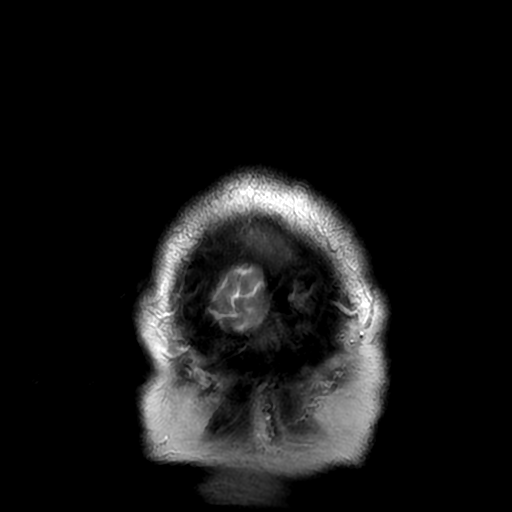
[im 28/28]
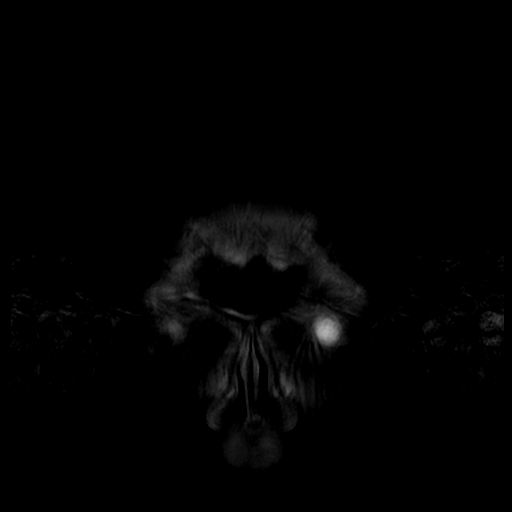

[Series 400: DWI · axial · 5.0mm · 1.02mm/px · z∈[-120,+25]mm · 2 of 30 slices shown (3 of 4)]
[im 1/30]
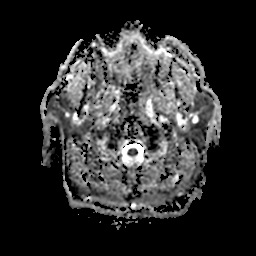
[im 30/30]
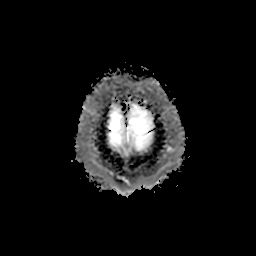

[Series 700: DWI · coronal · 5.0mm · 1.02mm/px · 2 of 33 slices shown (4 of 4)]
[im 1/33]
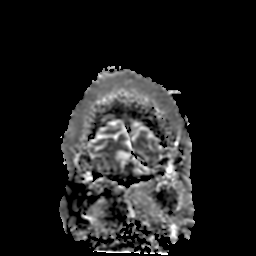
[im 33/33]
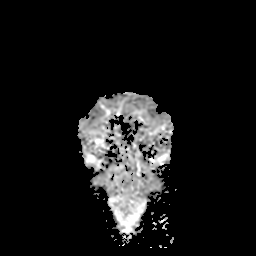

[27 of 48 positions shown; findings below may reference images not displayed]

FINDINGS: There is no evidence of acute infarct, intracranial hemorrhage,
mass, midline shift, or extra-axial fluid collection. There is mild
to moderate cerebral atrophy. Patchy T2 hyperintensities in the
subcortical and deep cerebral white matter are similar to the prior
MRI in nonspecific but compatible with moderate chronic small vessel
ischemic disease. Chronic lacunar infarcts are noted in the
periventricular white matter bilaterally. There is a small, chronic
infarct in the left cerebellum.

Prior left cataract extraction is noted. There is mild bilateral
ethmoid air cell mucosal thickening. Mastoid air cells are clear.
Major intracranial vascular flow voids are preserved.
IMPRESSION: 1. No acute intracranial abnormality.
2. Moderate chronic small vessel ischemic disease and cerebral
atrophy.

## 2016-07-02 IMAGING — MR MR CERVICAL SPINE WO/W CM
4 of 10 series · 19 of 48 positions shown · IV contrast (multihance)
Comparison: Brain MRI 01/04/2014. Cervical spine CT [DATE] and
earlier.

CLINICAL DATA: 63-year-old male with 3 weeks of right side
weakness, left side sensory loss. Left arm paresthesia and right arm
pain. Initial encounter.

EXAM:
MRI CERVICAL SPINE WITHOUT AND WITH CONTRAST
TECHNIQUE: Multiplanar and multiecho pulse sequences of the cervical spine, to
include the craniocervical junction and cervicothoracic junction,
were obtained according to standard protocol without and with
intravenous contrast.
CONTRAST:  20mL MULTIHANCE GADOBENATE DIMEGLUMINE 529 MG/ML IV SOLN

[Series 3: T2 · sagittal · 3.0mm · 0.35mm/px · 4 of 17 slices shown (1 of 3)]
[im 1/17]
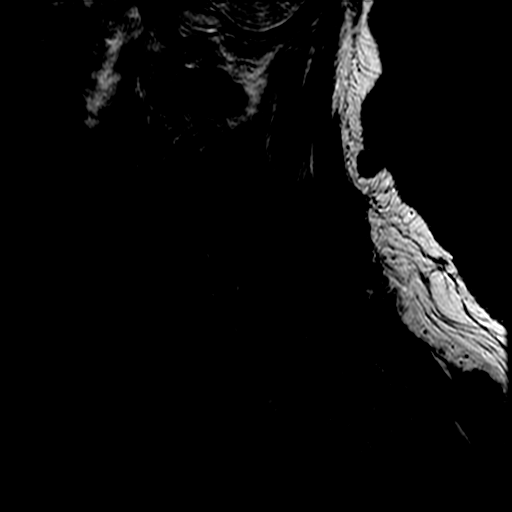
[im 6/17]
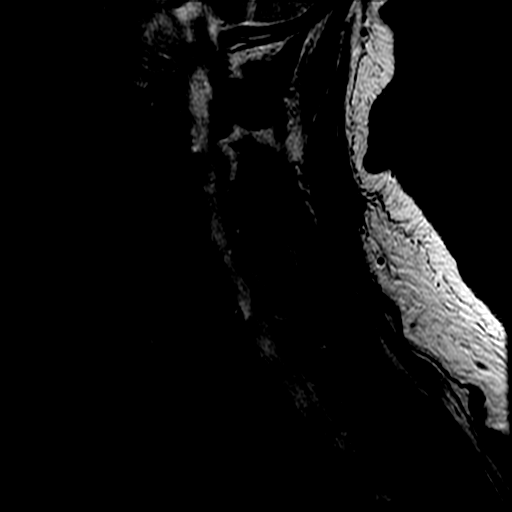
[im 11/17]
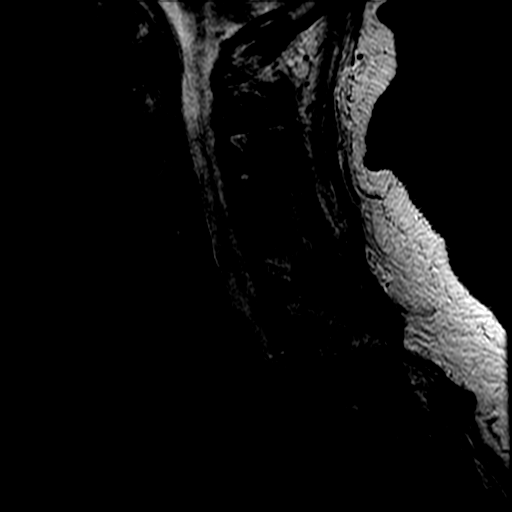
[im 17/17]
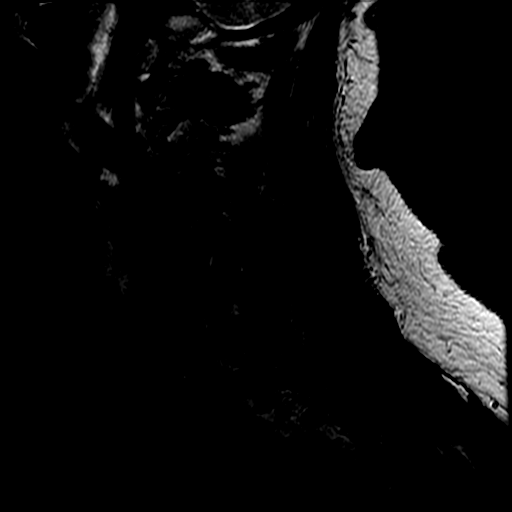

[Series 5: T2 · sagittal · 3.0mm · 0.35mm/px · 3 of 17 slices shown (2 of 3)]
[im 1/17]
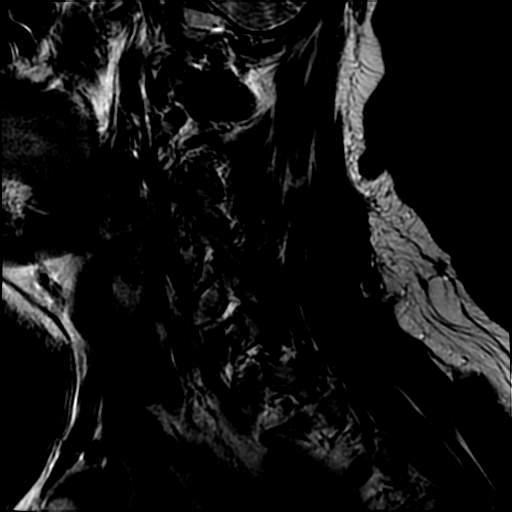
[im 9/17]
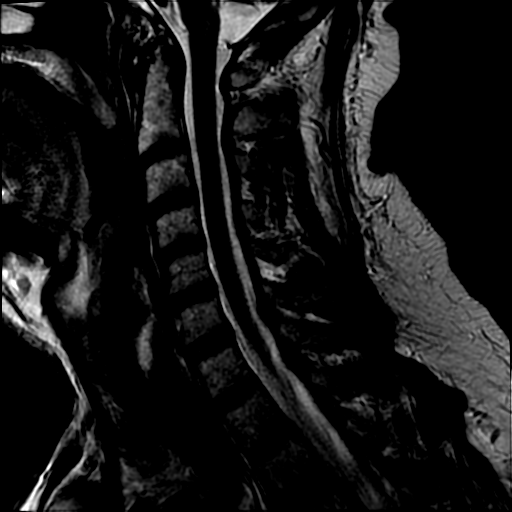
[im 17/17]
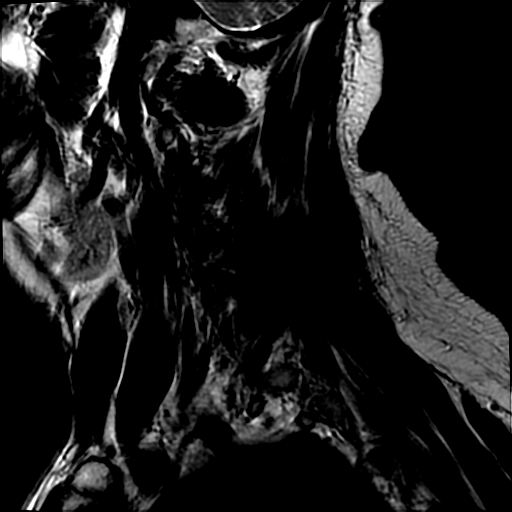

[Series 8: T2 · axial · 3.0mm · 0.35mm/px · z∈[-139,-7]mm · 7 of 36 slices shown (3 of 3)]
[im 1/36]
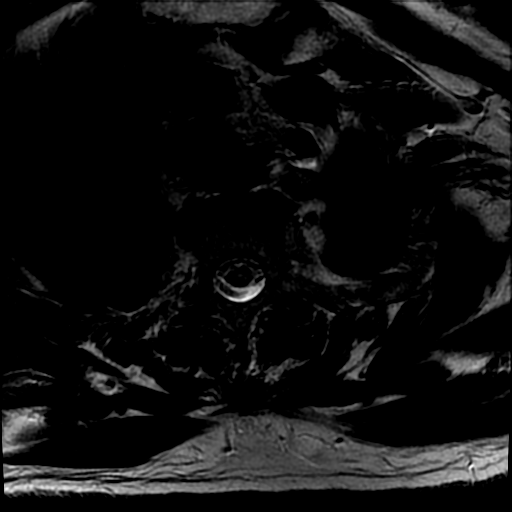
[im 6/36]
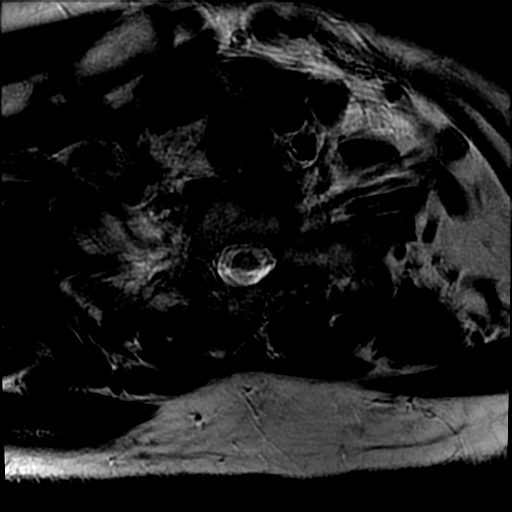
[im 12/36]
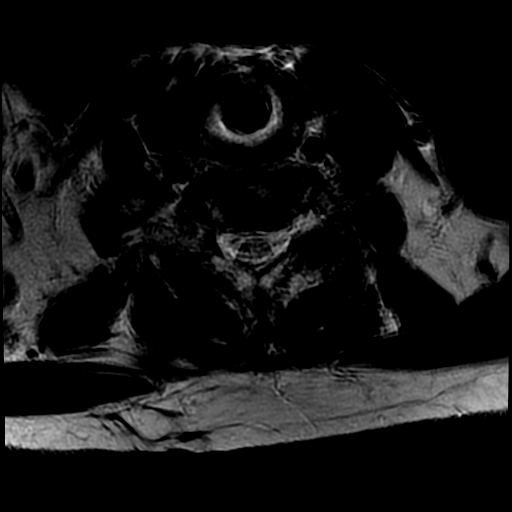
[im 18/36]
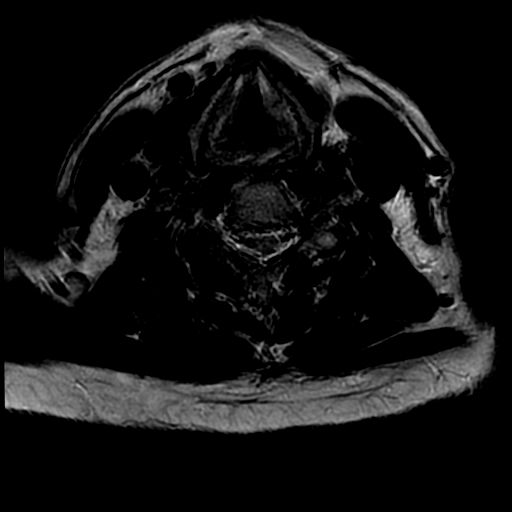
[im 24/36]
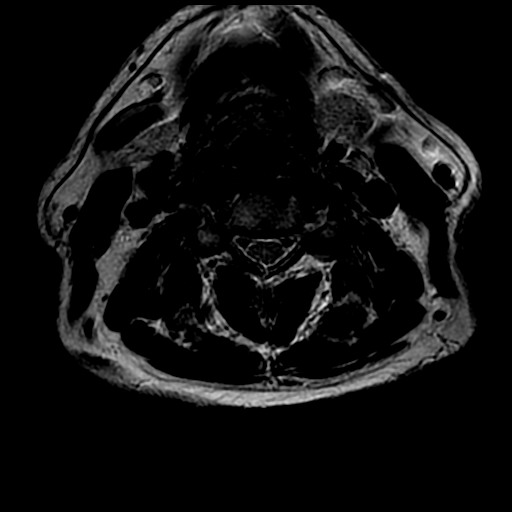
[im 30/36]
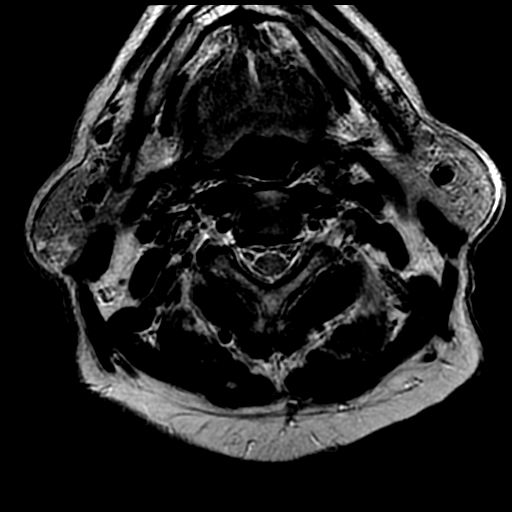
[im 36/36]
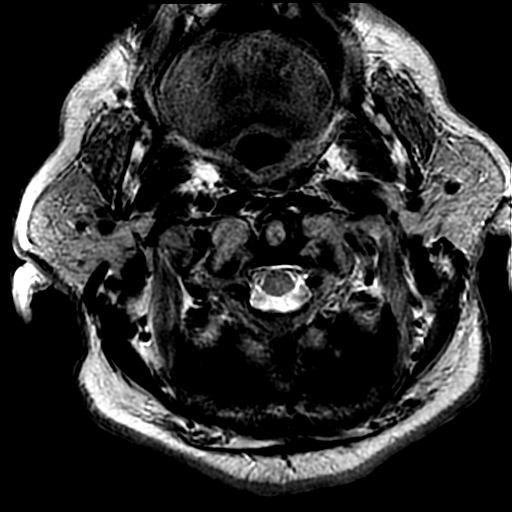

[Series 11: T1 · axial · non-contrast · 3.0mm · 0.35mm/px · z∈[-139,-30]mm · 5 of 36 slices shown]
[im 1/36]
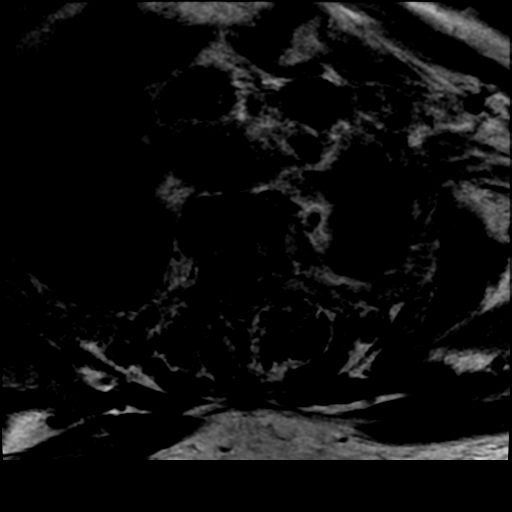
[im 6/36]
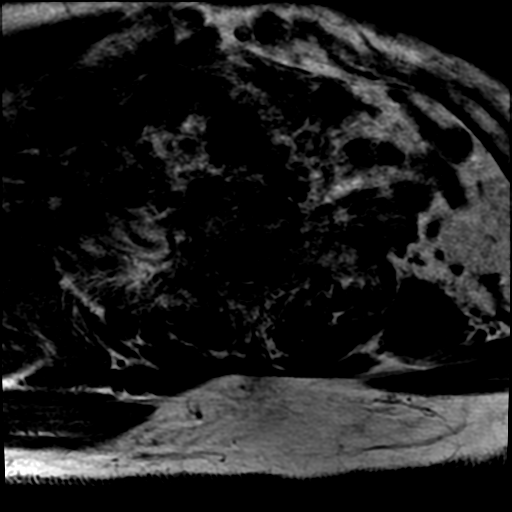
[im 12/36]
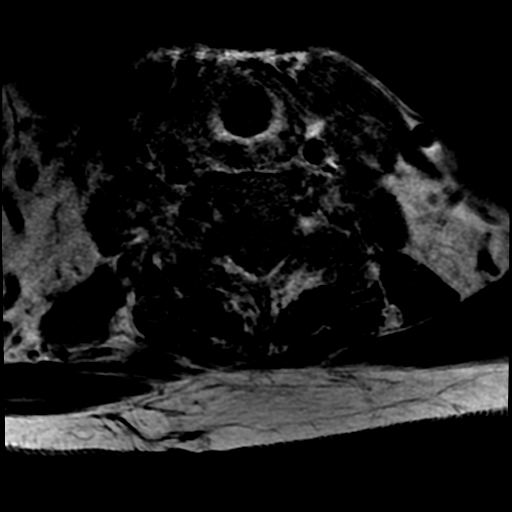
[im 18/36]
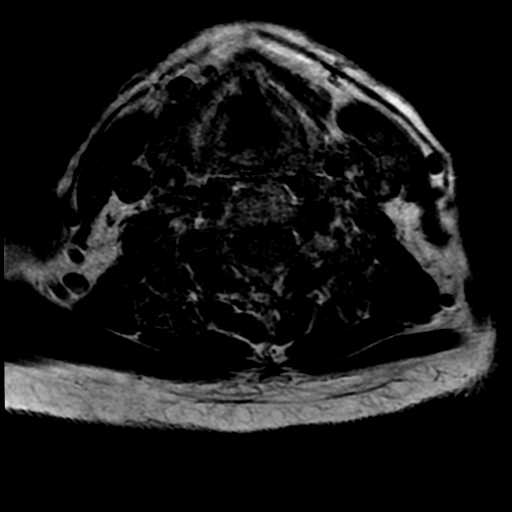
[im 30/36]
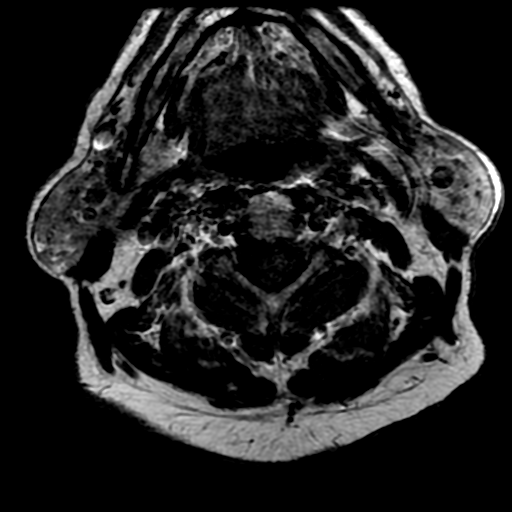

[19 of 48 positions shown; findings below may reference images not displayed]

FINDINGS: Study is intermittently degraded by motion artifact despite repeated
imaging attempts.

Preserved cervical lordosis. No marrow edema or evidence of acute
osseous abnormality.

Cervicomedullary junction is within normal limits. Spinal cord
signal is within normal limits at all visualized levels. No abnormal
enhancement identified.

Negative visualized posterior paraspinal soft tissues.

C2-C3:  Negative except for mild right uncovertebral hypertrophy.

C3-C4: Mild right facet and uncovertebral hypertrophy. Mild to
moderate right C4 foraminal stenosis.

C4-C5:  Negative except for mild left uncovertebral hypertrophy.

C5-C6:  Negative except for mild right uncovertebral hypertrophy.

C6-C7:  Negative.

C7-T1: Mild to moderate facet hypertrophy on the left. Mild left C8
foraminal stenosis.

No upper thoracic spinal stenosis.
IMPRESSION: Minimal/mild for age cervical spine degeneration.

No significant disc disease or spinal stenosis.

Mild to moderate neural foraminal stenosis at the right C4 and left
C8 nerve levels.

## 2016-07-04 NOTE — Progress Notes (Signed)
Cardiology Office Note   Date:  07/05/2016   ID:  Patrick Holt, Patrick Holt 08-19-1950, MRN [id number]  PCP:  [name], MD  Cardiologist:   Dorris Carnes, MD   F/u of CAD   History of Present Illness: Patrick Holt is a 66 y.o. male with a history of  CAD, subdural hematoma questionable CVA, atrial fib (CHADS VAsc 3), GI bleeding and shizophrenia  . 2-D echo in the hospital on 01/04/14  moderate LVH, EF 45-50% with akinesis of the basal mid inferior myocardium. There is grade 1 diastolic dysfunction. Left atrium was moderately dilated. Carotid Dopplers were okay.. Stress Myoview in January 2015 showed a fixed defect in the basal inferior and inferolateral walls that might represent old infarct or diaphragmatic attenuation. It was not gated so no ejection fraction was estimated.  I saw the pt in April 2017    Today had a chest pain  Then fell on floor  Had a hard time getting up  Breathing is OK     Current Meds  Medication Sig  . ALPRAZolam (XANAX) 1 MG tablet Take 1 mg by mouth 3 (three) times daily.  Marland Kitchen amiodarone (PACERONE) 200 MG tablet Take 0.5 tablets (100 mg total) by mouth daily.  Marland Kitchen amLODipine (NORVASC) 5 MG tablet Take 1 tablet (5 mg total) by mouth daily.  Marland Kitchen aspirin EC 81 MG tablet Take 1 tablet (81 mg total) by mouth daily.  Marland Kitchen atorvastatin (LIPITOR) 40 MG tablet Take 1 tablet (40 mg total) by mouth daily. (Patient taking differently: Take 40 mg by mouth at bedtime. )  . divalproex (DEPAKOTE ER) 250 MG 24 hr tablet Take 3 tablets (750 mg total) by mouth daily.  Marland Kitchen ipratropium-albuterol (DUONEB) 0.5-2.5 (3) MG/3ML SOLN Inhale 3 mLs into the lungs every 6 (six) hours as needed.  . levETIRAcetam (KEPPRA) 500 MG tablet TAKE 1 TABLET(500 MG) BY MOUTH TWICE DAILY  . loperamide (IMODIUM) 2 MG capsule TAKE 1 TO 2 CAPSULES BY MOUTH 3 TIMES A DAY AS NEEDED FOR DIARRHEA (Patient taking differently: Take 2 mg by mouth every 8 (eight) hours as needed for diarrhea or loose  stools. )  . Multiple Vitamin (MULTIVITAMIN WITH MINERALS) TABS tablet Take 1 tablet by mouth daily.  Marland Kitchen omeprazole (PRILOSEC) 20 MG capsule Take 20 mg by mouth daily.  . Potassium Chloride ER 20 MEQ TBCR Take 1 tablet by mouth every other day.   . predniSONE (DELTASONE) 50 MG tablet Take 1 tablet (50 mg total) by mouth daily with breakfast.  . risperiDONE (RISPERDAL) 1 MG tablet Take 1 tablet (1 mg total) by mouth 2 (two) times daily.  Marland Kitchen senna-docusate (SENOKOT-S) 8.6-50 MG tablet Take 1 tablet by mouth at bedtime as needed for mild constipation.  . [DISCONTINUED] cyanocobalamin (,VITAMIN B-12,) 1000 MCG/ML injection Please give IM once daily for 3 days, then once weekly for 4 weeks, then once monthly thereafter  . [DISCONTINUED] folic acid (FOLVITE) 1 MG tablet Take 2 tablets (2 mg total) by mouth daily.  . [DISCONTINUED] guaiFENesin (MUCINEX) 600 MG 12 hr tablet Take 1 tablet (600 mg total) by mouth 2 (two) times daily. OTC  . [DISCONTINUED] ibuprofen (ADVIL,MOTRIN) 400 MG tablet Take 0.5 tablets (200 mg total) by mouth every 8 (eight) hours as needed for moderate pain.     Allergies:   Immune globulins; Ciprofloxacin; Flagyl [metronidazole]; Lisinopril; Sulfa antibiotics; Requip [ropinirole hcl]; Betamethasone dipropionate; Bupropion hcl; Clobetasol; Codeine; Escitalopram oxalate; Fluoxetine hcl; Fluticasone-salmeterol; Furosemide; Meclizine; Paroxetine; Penicillins; Tacrolimus;  Tetanus toxoid; and Tuberculin purified protein derivative   Past Medical History:  Diagnosis Date  . Anemia   . Anxiety   . Asthma   . Atrial fibrillation ([place]) 01/2012   with RVR  . Bipolar 1 disorder ([place])   . Cancer ([place])   . Collagen vascular disease ([place])   . Conversion disorder 06/2010  . COPD (chronic obstructive pulmonary disease) ([place])   . Dermatomyositis ([place])   . Dermatomyositis ([place])   . Diverticulitis   . Diverticulosis of colon   . Dysrhythmia    "irregular" (11/15/2012)  . Esophageal  dysmotility   . Esophageal stricture   . Fibromyalgia   . Gastritis   . GERD (gastroesophageal reflux disease)   . Headache(784.0)    "severe; get them often" (11/15/2012)  . Hiatal hernia   . History of narcotic addiction ([place])   . Hx of adenomatous colonic polyps   . Hyperlipidemia   . Hypertension   . Internal hemorrhoids   . Ischemic heart disease   . Major depression    with acute psychotic break in 06/2010  . Myocardial infarct ([place])    mulitple (1999, 2000, 2004)  . Narcotic dependence ([place])   . Nephrolithiasis   . Obesity   . OCD (obsessive compulsive disorder)   . Otosclerosis   . Paroxysmal A-fib ([place])   . Peripheral neuropathy   . Raynaud's disease   . Renal insufficiency   . Rheumatoid arthritis(714.0)   . Sarcoidosis   . Seizures ([place])   . Subarachnoid hemorrhage ([place]) 01/2012   with subdural  hematoma.   . Type II diabetes mellitus ([place])   . Urge incontinence   . Vertigo     Past Surgical History:  Procedure Laterality Date  . BACK SURGERY    . CARDIAC CATHETERIZATION    . CARPAL TUNNEL RELEASE Bilateral   . CATARACT EXTRACTION W/ INTRAOCULAR LENS IMPLANT Left   . COLONOSCOPY N/A 09/27/2012   Procedure: COLONOSCOPY;  Surgeon: Lafayette Dragon, MD;  Location: [facility] ENDOSCOPY;  Service: Endoscopy;  Laterality: N/A;  . ESOPHAGOGASTRODUODENOSCOPY N/A 09/27/2012   Procedure: ESOPHAGOGASTRODUODENOSCOPY (EGD);  Surgeon: Lafayette Dragon, MD;  Location: [facility];  Service: Endoscopy;  Laterality: N/A;  . KNEE ARTHROSCOPY W/ MENISCAL REPAIR Left 2009  . LUMBAR DISC SURGERY    . LYMPH NODE DISSECTION Right 1970's   "neck; dr thought I had Hodgkins; turned out to be sarcoidosis" (11/15/2012)  . squamous papilloma   2010   removed by Dr. Constance Holster ENT, noted on tongue  . TONSILLECTOMY       Social History:  The patient  reports that he quit smoking about 18 months ago. His smoking use included Cigarettes. He has a 15.00 pack-year smoking history. He has never used  smokeless tobacco. He reports that he does not drink alcohol or use drugs.   Family History:  The patient's family history includes Alcohol abuse in his mother; Coronary artery disease in his father; Depression in his mother; Diabetes in his mother and other; Heart disease in his mother; Hepatitis in his brother; Stroke in his mother.    ROS:  Please see the history of present illness. All other systems are reviewed and  Negative to the above problem except as noted.    PHYSICAL EXAM: VS:  BP 134/80   Pulse 80   Ht 5\' 9"  (1.753 m)   Wt 228 lb 3.2 oz (103.5 kg)   BMI 33.70 kg/m   GEN: Well nourished, well developed,  in no acute distress   Exxamined in chair   HEENT: normal  Neck: no JVD, carotid bruits, or masses Cardiac: RRR; no murmurs, rubs, or gallops, 1+ edema  Respiratory:  clear to auscultation bilaterally, normal work of breathing GI: soft, nontender, nondistended, + BS  No hepatomegaly  MS: no deformity Moving all extremities  Weakerin L arm Skin: warm and dry, no rash Neuro:  Strength and sensation are intact  Psych: euthymic mood, full affect   EKG:  EKG is not ordered today.   Lipid Panel    Component Value Date/Time   CHOL 104 07/17/2014 0805   TRIG 96 07/17/2014 0805   HDL 34 (L) 07/17/2014 0805   CHOLHDL 3.1 07/17/2014 0805   VLDL 19 07/17/2014 0805   LDLCALC 51 07/17/2014 0805   LDLDIRECT 77 10/12/2013 1417      Wt Readings from Last 3 Encounters:  07/05/16 228 lb 3.2 oz (103.5 kg)  03/30/16 214 lb (97.1 kg)  11/18/15 214 lb 1.1 oz (97.1 kg)      ASSESSMENT AND PLAN:  1  PAF  Pt asymptomatic   On amiodarone  Appeas to be in SR on exam  Will need to check on labs  I would not anticoagulate given Hx    2  CAD  I am not convinced CP is angina  Follow    3  COPD  4  HTN  BP is good Continue meds  5  HL  LD Lgood  Keep on current statin     Current medicines are reviewed at length with the patient today.  The patient does not have concerns  regarding medicines.  Signed, Dorris Carnes, MD  07/05/2016 3:05 PM    [facility] [place], [place], [place]  [address] Phone: [phone]; Fax: [phone]

## 2016-07-05 ENCOUNTER — Ambulatory Visit (INDEPENDENT_AMBULATORY_CARE_PROVIDER_SITE_OTHER): Payer: Medicare Other | Admitting: Internal Medicine

## 2016-07-05 ENCOUNTER — Encounter: Payer: Self-pay | Admitting: Internal Medicine

## 2016-07-05 VITALS — BP 134/80 | HR 80 | Ht 69.0 in | Wt 228.2 lb

## 2016-07-05 DIAGNOSIS — I10 Essential (primary) hypertension: Secondary | ICD-10-CM | POA: Diagnosis not present

## 2016-07-05 DIAGNOSIS — I2510 Atherosclerotic heart disease of native coronary artery without angina pectoris: Secondary | ICD-10-CM | POA: Diagnosis not present

## 2016-07-05 DIAGNOSIS — E782 Mixed hyperlipidemia: Secondary | ICD-10-CM

## 2016-07-05 NOTE — Patient Instructions (Signed)
Your physician recommends that you continue on your current medications as directed. Please refer to the Current Medication list given to you today.     

## 2016-07-30 ENCOUNTER — Emergency Department (HOSPITAL_COMMUNITY)
Admission: EM | Admit: 2016-07-30 | Discharge: 2016-07-31 | Disposition: A | Discharge status: Home / Self Care | Payer: Medicare Other | Attending: Emergency Medicine | Admitting: Emergency Medicine

## 2016-07-30 ENCOUNTER — Encounter (HOSPITAL_COMMUNITY): Payer: Self-pay

## 2016-07-30 ENCOUNTER — Emergency Department (HOSPITAL_COMMUNITY): Payer: Medicare Other

## 2016-07-30 DIAGNOSIS — Z7982 Long term (current) use of aspirin: Secondary | ICD-10-CM | POA: Diagnosis not present

## 2016-07-30 DIAGNOSIS — R5381 Other malaise: Secondary | ICD-10-CM

## 2016-07-30 DIAGNOSIS — Z79899 Other long term (current) drug therapy: Secondary | ICD-10-CM | POA: Insufficient documentation

## 2016-07-30 DIAGNOSIS — N189 Chronic kidney disease, unspecified: Secondary | ICD-10-CM | POA: Diagnosis not present

## 2016-07-30 DIAGNOSIS — W010XXA Fall on same level from slipping, tripping and stumbling without subsequent striking against object, initial encounter: Secondary | ICD-10-CM | POA: Diagnosis not present

## 2016-07-30 DIAGNOSIS — I129 Hypertensive chronic kidney disease with stage 1 through stage 4 chronic kidney disease, or unspecified chronic kidney disease: Secondary | ICD-10-CM | POA: Insufficient documentation

## 2016-07-30 DIAGNOSIS — J449 Chronic obstructive pulmonary disease, unspecified: Secondary | ICD-10-CM | POA: Insufficient documentation

## 2016-07-30 DIAGNOSIS — E119 Type 2 diabetes mellitus without complications: Secondary | ICD-10-CM | POA: Diagnosis not present

## 2016-07-30 DIAGNOSIS — Y999 Unspecified external cause status: Secondary | ICD-10-CM | POA: Insufficient documentation

## 2016-07-30 DIAGNOSIS — J45909 Unspecified asthma, uncomplicated: Secondary | ICD-10-CM | POA: Insufficient documentation

## 2016-07-30 DIAGNOSIS — S2231XA Fracture of one rib, right side, initial encounter for closed fracture: Secondary | ICD-10-CM | POA: Diagnosis not present

## 2016-07-30 DIAGNOSIS — Y9301 Activity, walking, marching and hiking: Secondary | ICD-10-CM | POA: Diagnosis not present

## 2016-07-30 DIAGNOSIS — Z87891 Personal history of nicotine dependence: Secondary | ICD-10-CM | POA: Insufficient documentation

## 2016-07-30 DIAGNOSIS — Y929 Unspecified place or not applicable: Secondary | ICD-10-CM | POA: Insufficient documentation

## 2016-07-30 DIAGNOSIS — M6281 Muscle weakness (generalized): Secondary | ICD-10-CM | POA: Diagnosis not present

## 2016-07-30 DIAGNOSIS — S299XXA Unspecified injury of thorax, initial encounter: Secondary | ICD-10-CM | POA: Diagnosis present

## 2016-07-30 LAB — URINALYSIS, ROUTINE W REFLEX MICROSCOPIC
BILIRUBIN URINE: NEGATIVE
Glucose, UA: NEGATIVE mg/dL
HGB URINE DIPSTICK: NEGATIVE
KETONES UR: NEGATIVE mg/dL
Leukocytes, UA: NEGATIVE
NITRITE: NEGATIVE
PROTEIN: NEGATIVE mg/dL
Specific Gravity, Urine: 1.015 (ref 1.005–1.030)
pH: 8 (ref 5.0–8.0)

## 2016-07-30 LAB — COMPREHENSIVE METABOLIC PANEL
ALBUMIN: 3.2 g/dL — AB (ref 3.5–5.0)
ALT: 12 U/L — ABNORMAL LOW (ref 17–63)
AST: 21 U/L (ref 15–41)
Alkaline Phosphatase: 89 U/L (ref 38–126)
Anion gap: 6 (ref 5–15)
BUN: 14 mg/dL (ref 6–20)
CHLORIDE: 108 mmol/L (ref 101–111)
CO2: 28 mmol/L (ref 22–32)
Calcium: 8.1 mg/dL — ABNORMAL LOW (ref 8.9–10.3)
Creatinine, Ser: 0.85 mg/dL (ref 0.61–1.24)
GFR calc Af Amer: >60 mL/min (ref >60)
Glucose, Bld: 85 mg/dL (ref 65–99)
POTASSIUM: 3.6 mmol/L (ref 3.5–5.1)
SODIUM: 142 mmol/L (ref 135–145)
Total Bilirubin: 1.1 mg/dL (ref 0.3–1.2)
Total Protein: 6.3 g/dL — ABNORMAL LOW (ref 6.5–8.1)

## 2016-07-30 LAB — CBC
HCT: 39.2 % (ref 39.0–52.0)
Hemoglobin: 12.8 g/dL — ABNORMAL LOW (ref 13.0–17.0)
MCH: 29.9 pg (ref 26.0–34.0)
MCHC: 32.7 g/dL (ref 30.0–36.0)
MCV: 91.6 fL (ref 78.0–100.0)
PLATELETS: 252 10*3/uL (ref 150–400)
RBC: 4.28 MIL/uL (ref 4.22–5.81)
RDW: 13.6 % (ref 11.5–15.5)
WBC: 9.6 10*3/uL (ref 4.0–10.5)

## 2016-07-30 LAB — VALPROIC ACID LEVEL

## 2016-07-30 MED ORDER — TRAMADOL HCL 50 MG PO TABS: 50.0000 mg | ORAL_TABLET | Freq: Four times a day (QID) | ORAL | 0 refills | Status: DC | PRN

## 2016-07-30 MED ORDER — SODIUM CHLORIDE 0.9 % IV BOLUS (SEPSIS)
1000.0000 mL | Freq: Once | INTRAVENOUS | Status: AC
  Administered 2016-07-30: 1000 mL via INTRAVENOUS

## 2016-07-30 MED ORDER — TRAMADOL HCL 50 MG PO TABS
50.0000 mg | ORAL_TABLET | Freq: Once | ORAL | Status: AC
  Administered 2016-07-30: 50 mg via ORAL
  Filled 2016-07-30: qty 1

## 2016-07-30 NOTE — ED Notes (Signed)
ED Provider at bedside. 

## 2016-07-30 NOTE — ED Triage Notes (Addendum)
Pt c/o rib pain. He reports a fall last month where he injured his ribs. Today he fell 3 times and "re-aggravated the pain." Pt has a hx of COPD, MI, Stroke, and psych issues. He also reports chronic falls. A&Ox4 in triage. Gait impaired. Pt is anxious in triage. He uses a cane and walker at home. Per EMS, he is interested in Assisted Living or Nursing Home placement.

## 2016-07-30 NOTE — ED Notes (Signed)
Contact information updated today per registration staff

## 2016-07-30 NOTE — ED Notes (Signed)
Daniel, RT requested to bedside for blood collection. Orders modified.

## 2016-07-30 NOTE — ED Notes (Signed)
Pt still unable to give urine sample, but aware we need one. Urinal at bedside.

## 2016-07-30 NOTE — ED Notes (Signed)
2 failed attempts to collect labs 

## 2016-07-30 NOTE — ED Notes (Signed)
Patient transported to X-ray 

## 2016-07-30 NOTE — ED Notes (Signed)
Patient given sandwich and drink, ok by MD.

## 2016-07-30 NOTE — ED Notes (Signed)
[facility] called for patient transport 

## 2016-07-30 NOTE — ED Notes (Signed)
Incentive spirometry teaching completed, with return demonstration, instructed patient to use 10x/hour, or 3-4x every commercial break on television.

## 2016-07-30 NOTE — ED Notes (Signed)
Patient c/o ongoing chronic (16 years) back pain, and ongoing right rib pain that is worse after today's fall. Patient is scattered in his story telling of today's events. Patient reports falling early this morning, not being able to call 911 because he couldn't remember the number, EMS arriving, placing him on the couch, then rolling off couch. Details of story are unclear to this nurse. Abrasions and various staged healed cuts to both arms.

## 2016-07-30 NOTE — ED Notes (Signed)
IV attempt x2 unsuccessful. Right upper arm, right hand.

## 2016-07-30 NOTE — ED Notes (Signed)
Patient informed urine sample is needed. Urinal placed at bedside.

## 2016-07-30 NOTE — Progress Notes (Signed)
CSW met with pt and counseled pt on options available to pt with his insurance.  Pt now understands he must have Medicaid and/or private pay for ALF options.  CSW educated pt on how to request FL-2 from his PCP and provided pt with a list of SNF's which take [place].  In addition pt's CN placed a CM consult with order for Home Health orders and CSW alerted CM office which will call pt on 07/31/16 to discuss [facility] options with pt.  CSW offered pt resources for seniors in [facility] and encouraged pt to call Senior Resources on Monday.  Pt has a doctor's appointment on Wednesday 08/04/16 and will request FL-2 be sent to the SNF of his choice.  CSW informed pt a Education officer, museum with [place] will make themselves available to assist pt with his choices.  Pt was appreciative and thanked the CSW.  Please reconsult if future social work needs arise.  CSW signing off.  CN and RN updated.    Alphonse Guild. Sharnita Bogucki, Latanya Presser, LCAS Clinical Social Worker Ph: [phone]

## 2016-07-30 NOTE — ED Provider Notes (Signed)
[facility] DEPT Provider Note   CSN: [id number] Arrival date & time: 07/30/16  1546     History   Chief Complaint Chief Complaint  Patient presents with  . Rib Injury  . Fall    HPI AVEN CHRISTEN is a 66 y.o. male.  Patient c/o generalized weakness for the past several weeks.  States has fallen a couple times.  Notes falls due to tripping/mechanical.  Denies faintness, or dizziness. No syncope.  With fall last week, contusion to right lower chest wall. Pain constant, moderate, worse w palpation and movement. No sob. Denies head injury or loc. No headache. No neck or back pain. No cough or uri c/o. No abdominal pain. No nvd. No fever or chills. No gu c/o. Has eaten/drank very little today. At baseline, walks short distances slowly w walker.     Fall  Pertinent negatives include no abdominal pain, no headaches and no shortness of breath.    Past Medical History:  Diagnosis Date  . Anemia   . Anxiety   . Asthma   . Atrial fibrillation ([place]) 01/2012   with RVR  . Bipolar 1 disorder ([place])   . Cancer ([place])   . Collagen vascular disease ([place])   . Conversion disorder 06/2010  . COPD (chronic obstructive pulmonary disease) ([place])   . Dermatomyositis ([place])   . Dermatomyositis ([place])   . Diverticulitis   . Diverticulosis of colon   . Dysrhythmia    "irregular" (11/15/2012)  . Esophageal dysmotility   . Esophageal stricture   . Fibromyalgia   . Gastritis   . GERD (gastroesophageal reflux disease)   . Headache(784.0)    "severe; get them often" (11/15/2012)  . Hiatal hernia   . History of narcotic addiction ([place])   . Hx of adenomatous colonic polyps   . Hyperlipidemia   . Hypertension   . Internal hemorrhoids   . Ischemic heart disease   . Major depression    with acute psychotic break in 06/2010  . Myocardial infarct ([place])    mulitple (1999, 2000, 2004)  . Narcotic dependence ([place])   . Nephrolithiasis   . Obesity   . OCD (obsessive compulsive disorder)   .  Otosclerosis   . Paroxysmal A-fib ([place])   . Peripheral neuropathy   . Raynaud's disease   . Renal insufficiency   . Rheumatoid arthritis(714.0)   . Sarcoidosis   . Seizures ([place])   . Subarachnoid hemorrhage ([place]) 01/2012   with subdural  hematoma.   . Type II diabetes mellitus ([place])   . Urge incontinence   . Vertigo     Patient Active Problem List   Diagnosis Date Noted  . Pain in right wrist 02/27/2016  . Altered mental state 11/14/2015  . Severe dehydration 11/14/2015  . Hypernatremia 11/14/2015  . Difficulty in walking, not elsewhere classified   . Right wrist fracture   . Closed displaced fracture of ulnar styloid with routine healing   . Scapholunate ligament injury with no instability   . Falls frequently   . Hypoxia 11/02/2015  . Anxiety 06/06/2015  . Impaired mobility 04/28/2015  . Lumbar spondylosis with myelopathy 03/07/2015  . Nocturia 12/03/2014  . Chronic venous insufficiency 09/23/2014  . Poor social situation 07/30/2014  . Schizophrenia, acute   . Schizoaffective disorder, unspecified type ([place])   . Decreased visual acuity 06/27/2014  . COPD exacerbation ([place])   . Right sided weakness 01/04/2014  . Essential hypertension, benign   . Atrial fibrillation, unspecified   .  Radiculopathy of cervical region   . Polyuria 12/07/2013  . Erectile dysfunction 12/07/2013  . Diverticulitis 08/01/2013  . Acute diverticulitis 08/01/2013  . Hematochezia 06/04/2013  . Hypokalemia 10/03/2012  . Benign neoplasm of colon 09/27/2012  . Stricture and stenosis of esophagus 09/27/2012  . Diverticulitis of colon without hemorrhage 07/26/2012  . Polypharmacy 03/26/2012  . Seizures ([place]) 02/10/2012  . Atrial fibrillation ([place]) 02/05/2012  . Coronary atherosclerosis of native coronary artery 02/05/2012  . Radicular low back pain 03/09/2011  . History of seizure disorder 01/29/2011  . Irritable bowel syndrome (IBS) 06/24/2010  . Urge incontinence 06/19/2010  . TOBACCO USER  11/23/2008  . DEPRESSION, MAJOR 05/31/2008  . ISCHEMIC HEART DISEASE 05/31/2008  . RAYNAUD'S DISEASE 05/31/2008  . HIATAL HERNIA 05/31/2008  . Rheumatoid arthritis ([place]) 05/31/2008  . FIBROMYALGIA 05/31/2008  . Hyperlipidemia 01/26/2008  . Anxiety state 01/26/2008  . HEMORRHOIDS, INTERNAL 01/26/2008  . COPD (chronic obstructive pulmonary disease) ([place]) 01/26/2008  . ESOPHAGEAL MOTILITY DISORDER 01/26/2008  . GERD 01/26/2008  . Dermatomyositis ([place]) 01/26/2008    Past Surgical History:  Procedure Laterality Date  . BACK SURGERY    . CARDIAC CATHETERIZATION    . CARPAL TUNNEL RELEASE Bilateral   . CATARACT EXTRACTION W/ INTRAOCULAR LENS IMPLANT Left   . COLONOSCOPY N/A 09/27/2012   Procedure: COLONOSCOPY;  Surgeon: Lafayette Dragon, MD;  Location: [facility];  Service: Endoscopy;  Laterality: N/A;  . ESOPHAGOGASTRODUODENOSCOPY N/A 09/27/2012   Procedure: ESOPHAGOGASTRODUODENOSCOPY (EGD);  Surgeon: Lafayette Dragon, MD;  Location: [facility];  Service: Endoscopy;  Laterality: N/A;  . KNEE ARTHROSCOPY W/ MENISCAL REPAIR Left 2009  . LUMBAR DISC SURGERY    . LYMPH NODE DISSECTION Right 1970's   "neck; dr thought I had Hodgkins; turned out to be sarcoidosis" (11/15/2012)  . squamous papilloma   2010   removed by Dr. Constance Holster ENT, noted on tongue  . TONSILLECTOMY         Home Medications    Prior to Admission medications   Medication Sig Start Date End Date Taking? Authorizing Provider  ALPRAZolam Duanne Moron) 1 MG tablet Take 1 mg by mouth 3 (three) times daily.    [provider]  amiodarone (PACERONE) 200 MG tablet Take 0.5 tablets (100 mg total) by mouth daily. 10/17/15   [name], MD  amLODipine (NORVASC) 5 MG tablet Take 1 tablet (5 mg total) by mouth daily. 11/05/15   Demarest Bing, DO  aspirin EC 81 MG tablet Take 1 tablet (81 mg total) by mouth daily. 10/17/15   [name], MD  atorvastatin (LIPITOR) 40 MG tablet Take 1 tablet (40 mg total) by mouth daily. Patient  taking differently: Take 40 mg by mouth at bedtime.  10/17/15   [name], MD  divalproex (DEPAKOTE ER) 250 MG 24 hr tablet Take 3 tablets (750 mg total) by mouth daily. 12/15/15   [name], MD  ipratropium-albuterol (DUONEB) 0.5-2.5 (3) MG/3ML SOLN Inhale 3 mLs into the lungs every 6 (six) hours as needed. 11/17/15   Elgergawy, Silver Huguenin, MD  levETIRAcetam (KEPPRA) 500 MG tablet TAKE 1 TABLET(500 MG) BY MOUTH TWICE DAILY 10/17/15   [name], MD  loperamide (IMODIUM) 2 MG capsule TAKE 1 TO 2 CAPSULES BY MOUTH 3 TIMES A DAY AS NEEDED FOR DIARRHEA Patient taking differently: Take 2 mg by mouth every 8 (eight) hours as needed for diarrhea or loose stools.  08/20/15   [name], MD  Multiple Vitamin (MULTIVITAMIN WITH  MINERALS) TABS tablet Take 1 tablet by mouth daily. 10/17/15   [name], MD  omeprazole (PRILOSEC) 20 MG capsule Take 20 mg by mouth daily.    [provider]  Potassium Chloride ER 20 MEQ TBCR Take 1 tablet by mouth every other day.  11/29/14   [provider]  predniSONE (DELTASONE) 50 MG tablet Take 1 tablet (50 mg total) by mouth daily with breakfast. 01/31/16   Mayo, Pete Pelt, MD  risperiDONE (RISPERDAL) 1 MG tablet Take 1 tablet (1 mg total) by mouth 2 (two) times daily. 12/15/15   [name], MD  senna-docusate (SENOKOT-S) 8.6-50 MG tablet Take 1 tablet by mouth at bedtime as needed for mild constipation. 11/17/15   Elgergawy, Silver Huguenin, MD    Family History Family History  Problem Relation Age of Onset  . Alcohol abuse Mother   . Depression Mother   . Heart disease Mother   . Diabetes Mother   . Stroke Mother   . Coronary artery disease Father   . Diabetes Other        1/2 brother  . Hepatitis Brother     Social History Social History  Substance Use Topics  . Smoking status: Former Smoker    Packs/day: 0.50    Years: 30.00    Types: Cigarettes    Quit date: 12/15/2014  . Smokeless tobacco: Never Used     Comment: 3 cigs a day  .  Alcohol use No     Comment: Alcohol stopped in September of 2014     Allergies   Immune globulins; Ciprofloxacin; Flagyl [metronidazole]; Lisinopril; Sulfa antibiotics; Requip [ropinirole hcl]; Betamethasone dipropionate; Bupropion hcl; Clobetasol; Codeine; Escitalopram oxalate; Fluoxetine hcl; Fluticasone-salmeterol; Furosemide; Meclizine; Paroxetine; Penicillins; Tacrolimus; Tetanus toxoid; and Tuberculin purified protein derivative   Review of Systems Review of Systems  Constitutional: Negative for chills and fever.  HENT: Negative for sore throat.   Eyes: Negative for visual disturbance.  Respiratory: Negative for cough and shortness of breath.   Cardiovascular: Negative for leg swelling.  Gastrointestinal: Negative for abdominal pain, diarrhea and vomiting.  Genitourinary: Negative for flank pain.  Musculoskeletal: Negative for back pain and neck pain.  Skin: Negative for rash.  Neurological: Negative for speech difficulty, numbness and headaches.  Hematological: Does not bruise/bleed easily.  Psychiatric/Behavioral: Negative for confusion.     Physical Exam Updated Vital Signs BP (!) 144/69 (BP Location: Left Arm)   Pulse 76   Temp 99.1 F (37.3 C) (Oral)   Resp 16   SpO2 90%   Physical Exam  Constitutional: He is oriented to person, place, and time. He appears well-developed and well-nourished. No distress.  HENT:  Head: Atraumatic.  Dry mucous membranes.   Eyes: Conjunctivae are normal.  Neck: Neck supple. No tracheal deviation present.  Cardiovascular: Normal rate, regular rhythm, normal heart sounds and intact distal pulses.  Exam reveals no gallop and no friction rub.   No murmur heard. Pulmonary/Chest: Effort normal and breath sounds normal. No accessory muscle usage. No respiratory distress. He exhibits tenderness.  Right lower chest wall tenderness. No crepitus, normal chest wall movement.   Abdominal: Soft. Bowel sounds are normal. He exhibits no  distension. There is no tenderness.  Genitourinary:  Genitourinary Comments: No cva tenderness  Musculoskeletal: He exhibits no edema.  CTLS spine, non tender, aligned, no step off. Good rom extremities, no focal bony tenderness.   Neurological: He is alert and oriented to person, place, and time.  Speech clear, fluent. Motor  intact bil, stre 5/5. sens grossly intact.   Skin: Skin is warm and dry. No rash noted. He is not diaphoretic.  Psychiatric: He has a normal mood and affect.  Nursing note and vitals reviewed.    ED Treatments / Results  Labs (all labs ordered are listed, but only abnormal results are displayed) Results for orders placed or performed during the hospital encounter of 07/30/16  Urinalysis, Routine w reflex microscopic  Result Value Ref Range   Color, Urine YELLOW YELLOW   APPearance CLEAR CLEAR   Specific Gravity, Urine 1.015 1.005 - 1.030   pH 8.0 5.0 - 8.0   Glucose, UA NEGATIVE NEGATIVE mg/dL   Hgb urine dipstick NEGATIVE NEGATIVE   Bilirubin Urine NEGATIVE NEGATIVE   Ketones, ur NEGATIVE NEGATIVE mg/dL   Protein, ur NEGATIVE NEGATIVE mg/dL   Nitrite NEGATIVE NEGATIVE   Leukocytes, UA NEGATIVE NEGATIVE  CBC  Result Value Ref Range   WBC 9.6 4.0 - 10.5 K/uL   RBC 4.28 4.22 - 5.81 MIL/uL   Hemoglobin 12.8 (L) 13.0 - 17.0 g/dL   HCT 39.2 39.0 - 52.0 %   MCV 91.6 78.0 - 100.0 fL   MCH 29.9 26.0 - 34.0 pg   MCHC 32.7 30.0 - 36.0 g/dL   RDW 13.6 11.5 - 15.5 %   Platelets 252 150 - 400 K/uL  Comprehensive metabolic panel  Result Value Ref Range   Sodium 142 135 - 145 mmol/L   Potassium 3.6 3.5 - 5.1 mmol/L   Chloride 108 101 - 111 mmol/L   CO2 28 22 - 32 mmol/L   Glucose, Bld 85 65 - 99 mg/dL   BUN 14 6 - 20 mg/dL   Creatinine, Ser 0.85 0.61 - 1.24 mg/dL   Calcium 8.1 (L) 8.9 - 10.3 mg/dL   Total Protein 6.3 (L) 6.5 - 8.1 g/dL   Albumin 3.2 (L) 3.5 - 5.0 g/dL   AST 21 15 - 41 U/L   ALT 12 (L) 17 - 63 U/L   Alkaline Phosphatase 89 38 - 126 U/L    Total Bilirubin 1.1 0.3 - 1.2 mg/dL   GFR calc non Af Amer >60 >60 mL/min   GFR calc Af Amer >60 >60 mL/min   Anion gap 6 5 - 15  Valproic acid level  Result Value Ref Range   Valproic Acid Lvl <10 (L) 50.0 - 100.0 ug/mL   Dg Ribs Unilateral W/chest Right  Result Date: 07/30/2016 CLINICAL DATA:  Fall with right anterior rib pain EXAM: RIGHT RIBS AND CHEST - 3+ VIEW COMPARISON:  01/30/2016 FINDINGS: Low volume chest with streaky opacities, likely atelectasis. No edema, effusion, or pneumothorax. Mild cardiomegaly, stable. Limited evaluation the right ribs due to patient size and underpenetration. There is a nondisplaced lateral right fifth rib fracture on 1 of the oblique views. Remote and healed right fourth rib fracture. IMPRESSION: 1. Nondisplaced right fifth rib fracture. 2. No evidence of hemothorax or pneumothorax. 3. Low volumes with atelectasis at both bases. Electronically Signed   By: Monte Fantasia M.D.   On: 07/30/2016 17:52    EKG  EKG Interpretation None       Radiology Dg Ribs Unilateral W/chest Right  Result Date: 07/30/2016 CLINICAL DATA:  Fall with right anterior rib pain EXAM: RIGHT RIBS AND CHEST - 3+ VIEW COMPARISON:  01/30/2016 FINDINGS: Low volume chest with streaky opacities, likely atelectasis. No edema, effusion, or pneumothorax. Mild cardiomegaly, stable. Limited evaluation the right ribs due to patient size and underpenetration. There  is a nondisplaced lateral right fifth rib fracture on 1 of the oblique views. Remote and healed right fourth rib fracture. IMPRESSION: 1. Nondisplaced right fifth rib fracture. 2. No evidence of hemothorax or pneumothorax. 3. Low volumes with atelectasis at both bases. Electronically Signed   By: Monte Fantasia M.D.   On: 07/30/2016 17:52    Procedures Procedures (including critical care time)  Medications Ordered in ED Medications  sodium chloride 0.9 % bolus 1,000 mL (not administered)     Initial Impression / Assessment  and Plan / ED Course  I have reviewed the triage vital signs and the nursing notes.  Pertinent labs & imaging results that were available during my care of the patient were reviewed by me and considered in my medical decision making (see chart for details).  Iv ns bolus. Labs. Xrays.  Reviewed nursing notes and prior charts for additional history.   Ultram po.  Recheck pt, comfortable appearing. No increased wob.  Discussed xrays/labs w pt.   Pt currently appears stable for d/c.  Will order face to face home health eval, home health aide, sw to work w pt/pcp re should pt need eventual placement, rn.    Final Clinical Impressions(s) / ED Diagnoses   Final diagnoses:  None    New Prescriptions New Prescriptions   No medications on file     Lajean Saver, MD 07/30/16 2228

## 2016-07-30 NOTE — Discharge Instructions (Signed)
It was our pleasure to provide your ER care today - we hope that you feel better.  Stay active, take full and deep breaths.  Use incentive spirometer, 10 full and complete breaths in every hour while awake.   Use great care/caution to avoid falling.  Use your walker to help your balance.   Take motrin or aleve as need for pain. You may also take ultram as need for pain - no driving when taking.  Follow up with primary care doctor in the coming week.   We made a home health referral - they should be following up with you in the next 1-2 days - they can also help you at home, and can assist should you wish to explore extended care facility options.   Return to ER if worse, new symptoms, fevers, trouble breathing, other concern.

## 2016-09-23 IMAGING — CR DG CHEST 2V
2 series · 2 of 2 positions shown · non-contrast
Comparison: Chest radiograph January 03, 2014

CLINICAL DATA: Chest pain for 2 days radiating to LEFT arm.
Diarrhea and weakness. History of myocardial infarction and atrial
fibrillation.

EXAM:
CHEST  2 VIEW

[chest lat]
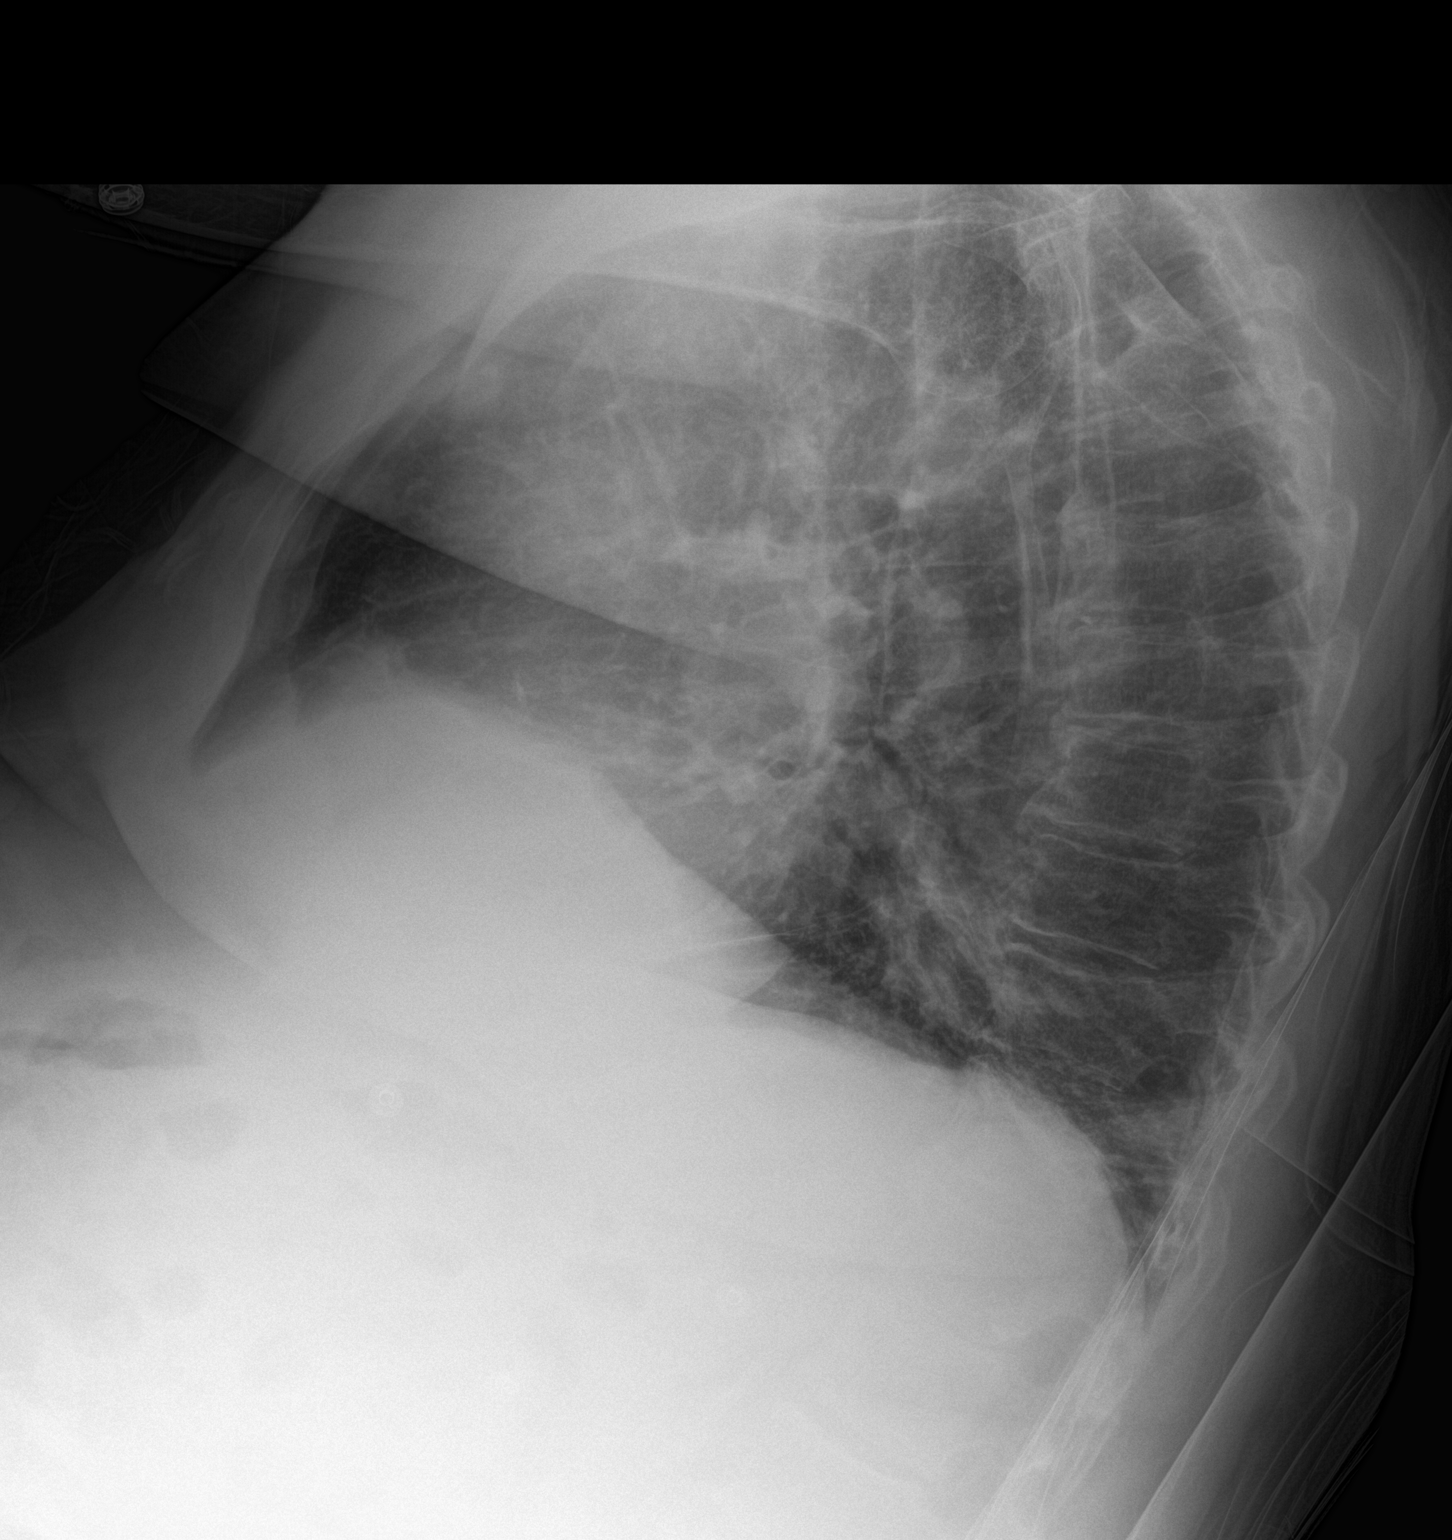

[chest ap]
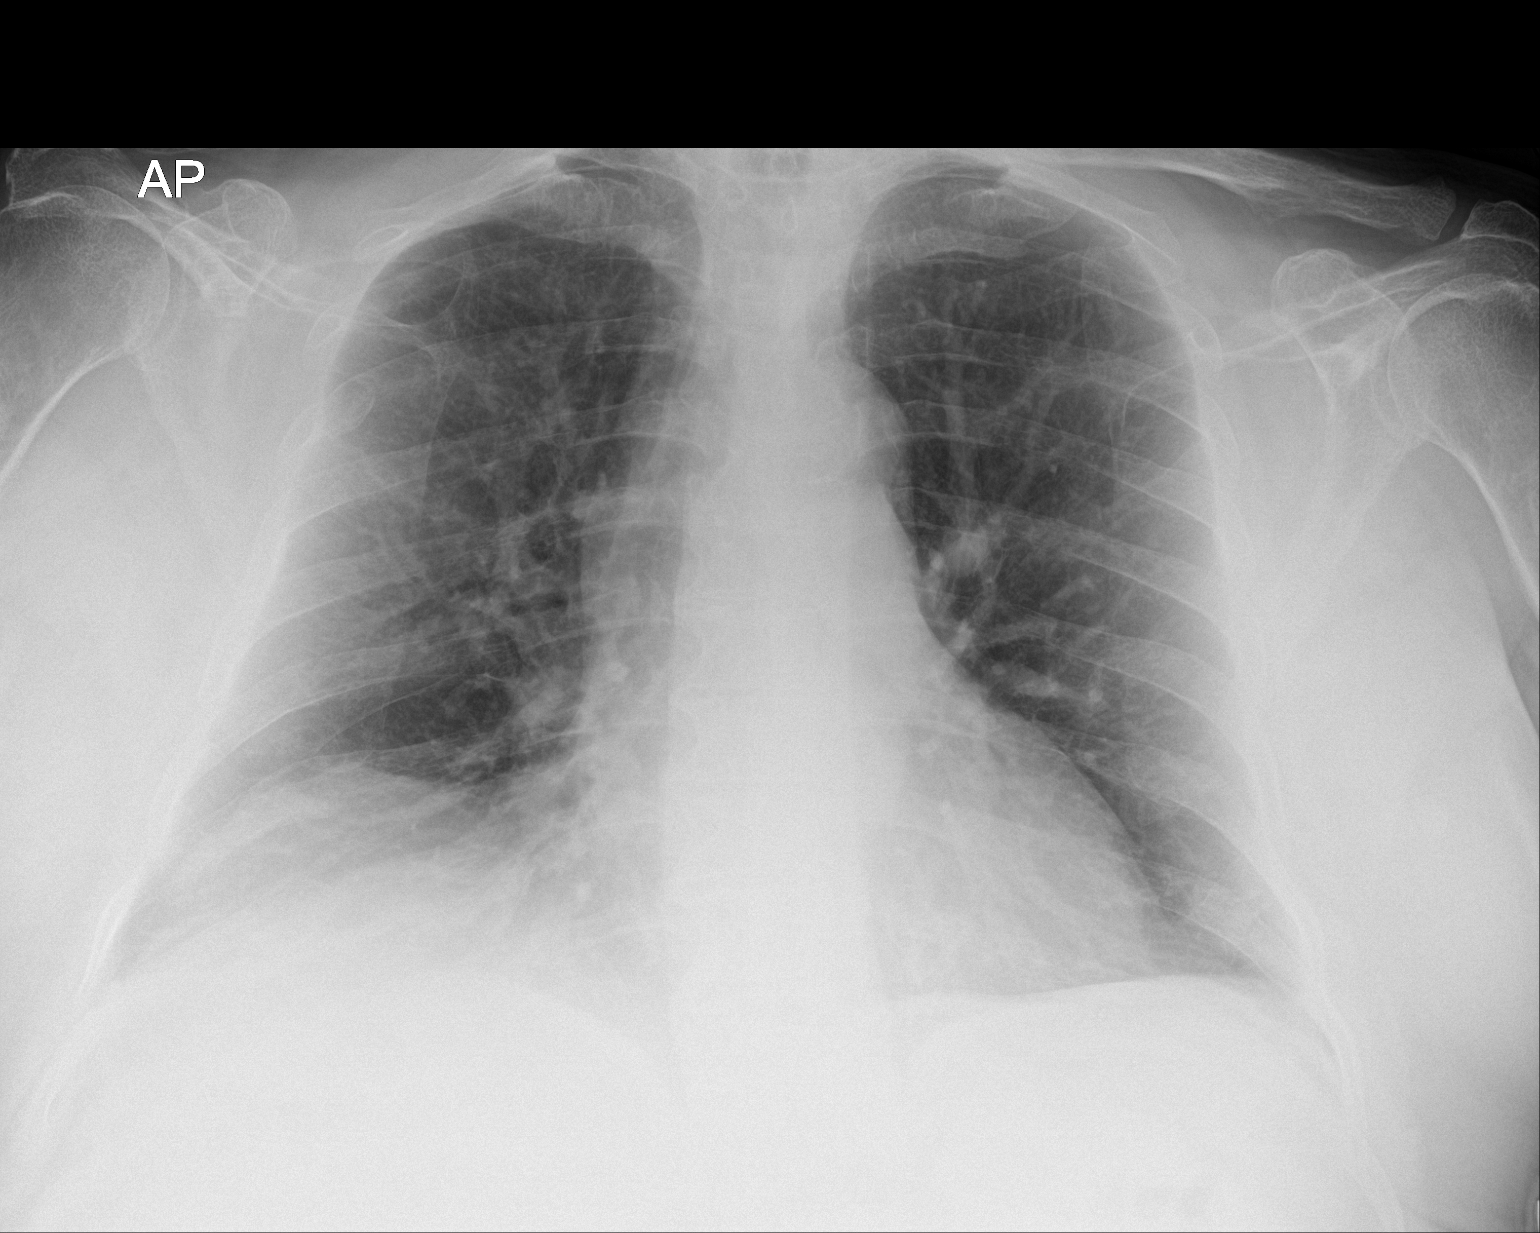

[2 of 2 positions shown; findings below may reference images not displayed]

FINDINGS: The cardiac silhouette remains mildly enlarged. Mediastinal
silhouette is nonsuspicious. Increased lung volumes with mildly
flattened hemidiaphragms. Hepatic eventration again noted. Bibasilar
strandy densities without pleural effusion or focal consolidation.
No pneumothorax. Mild osteopenia. Soft tissue planes are
nonsuspicious.
IMPRESSION: Mild cardiomegaly.  Bibasilar atelectasis.

  By: Sebastian Camilo Memes

## 2016-11-04 ENCOUNTER — Encounter (INDEPENDENT_AMBULATORY_CARE_PROVIDER_SITE_OTHER): Payer: Self-pay

## 2016-11-04 ENCOUNTER — Encounter: Payer: Self-pay | Admitting: Internal Medicine

## 2016-11-04 ENCOUNTER — Ambulatory Visit (INDEPENDENT_AMBULATORY_CARE_PROVIDER_SITE_OTHER): Payer: Medicare Other | Admitting: Internal Medicine

## 2016-11-04 VITALS — BP 140/72 | HR 78 | Ht 69.0 in | Wt 235.0 lb

## 2016-11-04 DIAGNOSIS — I10 Essential (primary) hypertension: Secondary | ICD-10-CM

## 2016-11-04 DIAGNOSIS — I480 Paroxysmal atrial fibrillation: Secondary | ICD-10-CM

## 2016-11-04 DIAGNOSIS — E782 Mixed hyperlipidemia: Secondary | ICD-10-CM

## 2016-11-04 DIAGNOSIS — I2510 Atherosclerotic heart disease of native coronary artery without angina pectoris: Secondary | ICD-10-CM | POA: Diagnosis not present

## 2016-11-04 NOTE — Patient Instructions (Addendum)
Your physician recommends that you continue on your current medications as directed. Please refer to the Current Medication list given to you today.  Your physician wants you to follow-up in: June, 2019 with Dr. Harrington Challenger.  You will receive a reminder letter in the mail two months in advance. If you don't receive a letter, please call our office to schedule the follow-up appointment.   11/04/16 Addendum: called PCP Kevan Ny, [facility] on [facility].  They have ov from that day, no labs.  Faxing ov note to [phone].

## 2016-11-04 NOTE — Progress Notes (Signed)
Cardiology Office Note   Date:  11/04/2016   ID:  Taige, Housman May 28, 1950, MRN [id number]  PCP:  [facility], [facility]  Cardiologist:   Dorris Carnes, MD   F/u of CAD   History of Present Illness: Patrick Holt is a 66 y.o. male with a history of CAD, subdural hematoma questionable CVA, atrial fib (CHADS VAsc 3), GI bleeding and shizophrenia  . 2-D echo in the hospital on 01/04/14  moderate LVH, EF 45-50% with akinesis of the basal mid inferior myocardium. There is grade 1 diastolic dysfunction. Left atrium was moderately dilated. Carotid Dopplers were okay.. Stress Myoview in January 2015 showed a fixed defect in the basal inferior and inferolateral walls that might represent old infarct or diaphragmatic attenuation. It was not gated so no ejection fraction was estimated.  I saw the pt in April 2018   Pt contines to have intermitt CP  Not associated with activity  Occurs at night  [facility] a lot  Broke arm  Said noone fixed it    Has had esophagus stretched in past  Some difficulties with food    Current Meds  Medication Sig  . ALPRAZolam (XANAX) 1 MG tablet Take 1 mg by mouth 3 (three) times daily.  Marland Kitchen amiodarone (PACERONE) 200 MG tablet Take 0.5 tablets (100 mg total) by mouth daily.  Marland Kitchen amLODipine (NORVASC) 5 MG tablet Take 1 tablet (5 mg total) by mouth daily.  Marland Kitchen aspirin EC 81 MG tablet Take 1 tablet (81 mg total) by mouth daily.  Marland Kitchen atorvastatin (LIPITOR) 40 MG tablet Take 1 tablet (40 mg total) by mouth daily. (Patient taking differently: Take 40 mg by mouth at bedtime. )  . divalproex (DEPAKOTE ER) 250 MG 24 hr tablet Take 3 tablets (750 mg total) by mouth daily.  Marland Kitchen esomeprazole (NEXIUM) 40 MG capsule Take 40 mg by mouth daily.  Marland Kitchen ipratropium-albuterol (DUONEB) 0.5-2.5 (3) MG/3ML SOLN Inhale 3 mLs into the lungs every 6 (six) hours as needed.  . levETIRAcetam (KEPPRA) 500 MG tablet TAKE 1 TABLET(500 MG) BY MOUTH TWICE DAILY  . loperamide  (IMODIUM) 2 MG capsule TAKE 1 TO 2 CAPSULES BY MOUTH 3 TIMES A DAY AS NEEDED FOR DIARRHEA (Patient taking differently: Take 2 mg by mouth every 8 (eight) hours as needed for diarrhea or loose stools. )  . Multiple Vitamin (MULTIVITAMIN WITH MINERALS) TABS tablet Take 1 tablet by mouth daily.  Marland Kitchen oxybutynin (DITROPAN) 5 MG tablet Take 5 mg by mouth daily.  . Potassium Chloride ER 20 MEQ TBCR Take 1 tablet by mouth every other day.   . risperiDONE (RISPERDAL) 1 MG tablet Take 1 tablet (1 mg total) by mouth 2 (two) times daily.  . traMADol (ULTRAM) 50 MG tablet Take 1 tablet (50 mg total) by mouth every 6 (six) hours as needed.     Allergies:   Immune globulins; Ciprofloxacin; Flagyl [metronidazole]; Lisinopril; Sulfa antibiotics; Requip [ropinirole hcl]; Betamethasone dipropionate; Bupropion hcl; Clobetasol; Codeine; Escitalopram oxalate; Fluoxetine hcl; Fluticasone-salmeterol; Furosemide; Meclizine; Paroxetine; Penicillins; Tacrolimus; Tetanus toxoid; and Tuberculin purified protein derivative   Past Medical History:  Diagnosis Date  . Anemia   . Anxiety   . Asthma   . Atrial fibrillation ([place]) 01/2012   with RVR  . Bipolar 1 disorder ([place])   . Cancer ([place])   . Collagen vascular disease ([place])   . Conversion disorder 06/2010  . COPD (chronic obstructive pulmonary disease) ([place])   . Dermatomyositis ([place])   .  Dermatomyositis ([place])   . Diverticulitis   . Diverticulosis of colon   . Dysrhythmia    "irregular" (11/15/2012)  . Esophageal dysmotility   . Esophageal stricture   . Fibromyalgia   . Gastritis   . GERD (gastroesophageal reflux disease)   . Headache(784.0)    "severe; get them often" (11/15/2012)  . Hiatal hernia   . History of narcotic addiction ([place])   . Hx of adenomatous colonic polyps   . Hyperlipidemia   . Hypertension   . Internal hemorrhoids   . Ischemic heart disease   . Major depression    with acute psychotic break in 06/2010  . Myocardial infarct ([place])     mulitple (1999, 2000, 2004)  . Narcotic dependence ([place])   . Nephrolithiasis   . Obesity   . OCD (obsessive compulsive disorder)   . Otosclerosis   . Paroxysmal A-fib ([place])   . Peripheral neuropathy   . Raynaud's disease   . Renal insufficiency   . Rheumatoid arthritis(714.0)   . Sarcoidosis   . Seizures ([place])   . Subarachnoid hemorrhage ([place]) 01/2012   with subdural  hematoma.   . Type II diabetes mellitus ([place])   . Urge incontinence   . Vertigo     Past Surgical History:  Procedure Laterality Date  . BACK SURGERY    . CARDIAC CATHETERIZATION    . CARPAL TUNNEL RELEASE Bilateral   . CATARACT EXTRACTION W/ INTRAOCULAR LENS IMPLANT Left   . COLONOSCOPY N/A 09/27/2012   Procedure: COLONOSCOPY;  Surgeon: Lafayette Dragon, MD;  Location: [facility];  Service: Endoscopy;  Laterality: N/A;  . ESOPHAGOGASTRODUODENOSCOPY N/A 09/27/2012   Procedure: ESOPHAGOGASTRODUODENOSCOPY (EGD);  Surgeon: Lafayette Dragon, MD;  Location: [facility];  Service: Endoscopy;  Laterality: N/A;  . KNEE ARTHROSCOPY W/ MENISCAL REPAIR Left 2009  . LUMBAR DISC SURGERY    . LYMPH NODE DISSECTION Right 1970's   "neck; dr thought I had Hodgkins; turned out to be sarcoidosis" (11/15/2012)  . squamous papilloma   2010   removed by Dr. Constance Holster ENT, noted on tongue  . TONSILLECTOMY       Social History:  The patient  reports that he quit smoking about 22 months ago. His smoking use included Cigarettes. He has a 15.00 pack-year smoking history. He has never used smokeless tobacco. He reports that he does not drink alcohol or use drugs.   Family History:  The patient's family history includes Alcohol abuse in his mother; Coronary artery disease in his father; Depression in his mother; Diabetes in his mother and other; Heart disease in his mother; Hepatitis in his brother; Stroke in his mother.    ROS:  Please see the history of present illness. All other systems are reviewed and  Negative to the above problem except as  noted.    PHYSICAL EXAM: VS:  BP 140/72   Pulse 78   Ht 5\' 9"  (1.753 m)   Wt 235 lb (106.6 kg) Comment: pt stated his weight, could not stand on scale  SpO2 90%   BMI 34.70 kg/m   GEN: Well nourished, well developed, in no acute distress   Exxamined in chair   HEENT: Excoriation on forehead   Neck: no JVD, carotid bruits, or masses Cardiac: RRR; no murmurs, rubs, or gallops,  Tr edema  Respiratory:  clear to auscultation bilaterally, normal work of breathing GI: soft, nontender, nondistended, + BS  No hepatomegaly  MS: no deformity Moving all extremities  Weakerin L arm  Neuro:  Moving all extremities   Psych: euthymic mood, full affect   EKG:  EKG is not ordered today.   Lipid Panel    Component Value Date/Time   CHOL 104 07/17/2014 0805   TRIG 96 07/17/2014 0805   HDL 34 (L) 07/17/2014 0805   CHOLHDL 3.1 07/17/2014 0805   VLDL 19 07/17/2014 0805   LDLCALC 51 07/17/2014 0805   LDLDIRECT 77 10/12/2013 1417      Wt Readings from Last 3 Encounters:  11/04/16 235 lb (106.6 kg)  07/05/16 228 lb 3.2 oz (103.5 kg)  03/30/16 214 lb (97.1 kg)      ASSESSMENT AND PLAN:  1  PAF  Pt asymptomatic   On amiodarone   Continue  Denies signif palpitations  Not an anticoag candidate with falls   2  CAD  CP is atypical  I am not convinced of active angina    3  COPD  4  HTN  BP is fair  I would not push furhter with falls   Continue meds  5  HL LDL is 77  I would keep on current meds  Get labs from [facility] in June 2019   Current medicines are reviewed at length with the patient today.  The patient does not have concerns regarding medicines.  Signed, Dorris Carnes, MD  11/04/2016 11:49 AM    [facility] [place], [place], [place]  [address] Phone: [phone]; Fax: [phone]

## 2016-11-11 ENCOUNTER — Encounter: Payer: Self-pay | Admitting: Nurse Practitioner

## 2016-11-11 ENCOUNTER — Ambulatory Visit (INDEPENDENT_AMBULATORY_CARE_PROVIDER_SITE_OTHER): Payer: Medicare Other | Admitting: Nurse Practitioner

## 2016-11-11 VITALS — BP 102/66 | HR 80 | Ht 66.0 in | Wt 235.0 lb

## 2016-11-11 DIAGNOSIS — R1310 Dysphagia, unspecified: Secondary | ICD-10-CM

## 2016-11-11 DIAGNOSIS — Z86010 Personal history of colonic polyps: Secondary | ICD-10-CM

## 2016-11-11 NOTE — Progress Notes (Addendum)
HPI: Patient is a 66 year old male with multiple medical problems not limited to schizoaffective disorder, dermomyositis, hypertension, COPD,  CAD, AFib on amiodarone, and moderate LV dysfunction, EF 21-19%, grade 1 diastolic dysfunction. He gets around on a motorized chair, is basically non-ambulatory.  He was previously followed by Dr. Olevia Perches. In 2014 he underwent dilation of an esophageal stricture. On the same day he had a colonoscopy with polypectomy. Two polyps (3-7 mm) were removed. Path c/w tubular adenomas without HGD.   Patrick Holt is due for surveillance colonoscopy but also inquiring about an EGD because he has been having recurrent  dysphagia over the last year. He has no bowel changes or blood in stools. Regarding dysphagia, solid food is most problematic but milk can also cause problems. No odynophagia. He gets non-exertional sharp chest pain from time to time and the pain involves his left arm. Pain occurs at night and this was addressed again by Cardiology during office follow up last week  Chest pain atypical and not felt to be anginal in nature.    Past Medical History:  Diagnosis Date  . Anemia   . Anxiety   . Asthma   . Atrial fibrillation ([place]) 01/2012   with RVR  . Bipolar 1 disorder ([place])   . Cancer ([place])   . Collagen vascular disease ([place])   . Conversion disorder 06/2010  . COPD (chronic obstructive pulmonary disease) ([place])   . Dermatomyositis ([place])   . Dermatomyositis ([place])   . Diverticulitis   . Diverticulosis of colon   . Dysrhythmia    "irregular" (11/15/2012)  . Esophageal dysmotility   . Esophageal stricture   . Fibromyalgia   . Gastritis   . GERD (gastroesophageal reflux disease)   . Headache(784.0)    "severe; get them often" (11/15/2012)  . Hiatal hernia   . History of narcotic addiction ([place])   . Hx of adenomatous colonic polyps   . Hyperlipidemia   . Hypertension   . Internal hemorrhoids   . Ischemic heart disease   . Major depression     with acute psychotic break in 06/2010  . Myocardial infarct ([place])    mulitple (1999, 2000, 2004)  . Narcotic dependence ([place])   . Nephrolithiasis   . Obesity   . OCD (obsessive compulsive disorder)   . Otosclerosis   . Paroxysmal A-fib ([place])   . Peripheral neuropathy   . Raynaud's disease   . Renal insufficiency   . Rheumatoid arthritis(714.0)   . Sarcoidosis   . Seizures ([place])   . Subarachnoid hemorrhage ([place]) 01/2012   with subdural  hematoma.   . Type II diabetes mellitus ([place])   . Urge incontinence   . Vertigo      Past Surgical History:  Procedure Laterality Date  . BACK SURGERY    . CARDIAC CATHETERIZATION    . CARPAL TUNNEL RELEASE Bilateral   . CATARACT EXTRACTION W/ INTRAOCULAR LENS IMPLANT Left   . COLONOSCOPY N/A 09/27/2012   Procedure: COLONOSCOPY;  Surgeon: Patrick Dragon, MD;  Location: [facility];  Service: Endoscopy;  Laterality: N/A;  . ESOPHAGOGASTRODUODENOSCOPY N/A 09/27/2012   Procedure: ESOPHAGOGASTRODUODENOSCOPY (EGD);  Surgeon: Patrick Dragon, MD;  Location: [facility];  Service: Endoscopy;  Laterality: N/A;  . KNEE ARTHROSCOPY W/ MENISCAL REPAIR Left 2009  . LUMBAR DISC SURGERY    . LYMPH NODE DISSECTION Right 1970's   "neck; dr thought I had Hodgkins; turned out to be sarcoidosis" (11/15/2012)  . squamous papilloma  2010   removed by Dr. Constance Holster ENT, noted on tongue  . TONSILLECTOMY     Family History  Problem Relation Age of Onset  . Alcohol abuse Mother   . Depression Mother   . Heart disease Mother   . Diabetes Mother   . Stroke Mother   . Coronary artery disease Father   . Diabetes Other        1/2 brother  . Hepatitis Brother    Social History  Substance Use Topics  . Smoking status: Former Smoker    Packs/day: 0.50    Years: 30.00    Types: Cigarettes    Quit date: 12/15/2014  . Smokeless tobacco: Never Used     Comment: 3 cigs a day  . Alcohol use No     Comment: Alcohol stopped in September of 2014   Current Outpatient  Prescriptions  Medication Sig Dispense Refill  . ALPRAZolam (XANAX) 1 MG tablet Take 1 mg by mouth 3 (three) times daily.    Marland Kitchen amiodarone (PACERONE) 200 MG tablet Take 0.5 tablets (100 mg total) by mouth daily. 45 tablet 3  . amLODipine (NORVASC) 5 MG tablet Take 1 tablet (5 mg total) by mouth daily. 30 tablet 0  . aspirin EC 81 MG tablet Take 1 tablet (81 mg total) by mouth daily. 90 tablet 3  . atorvastatin (LIPITOR) 40 MG tablet Take 1 tablet (40 mg total) by mouth daily. (Patient taking differently: Take 40 mg by mouth at bedtime. ) 90 tablet 3  . divalproex (DEPAKOTE ER) 250 MG 24 hr tablet Take 3 tablets (750 mg total) by mouth daily. 90 tablet 0  . esomeprazole (NEXIUM) 40 MG capsule Take 40 mg by mouth daily.    Marland Kitchen ipratropium-albuterol (DUONEB) 0.5-2.5 (3) MG/3ML SOLN Inhale 3 mLs into the lungs every 6 (six) hours as needed. 360 mL   . levETIRAcetam (KEPPRA) 500 MG tablet TAKE 1 TABLET(500 MG) BY MOUTH TWICE DAILY 180 tablet 0  . loperamide (IMODIUM) 2 MG capsule TAKE 1 TO 2 CAPSULES BY MOUTH 3 TIMES A DAY AS NEEDED FOR DIARRHEA (Patient taking differently: Take 2 mg by mouth every 8 (eight) hours as needed for diarrhea or loose stools. ) 90 capsule 3  . Multiple Vitamin (MULTIVITAMIN WITH MINERALS) TABS tablet Take 1 tablet by mouth daily. 90 tablet 3  . oxybutynin (DITROPAN) 5 MG tablet Take 5 mg by mouth daily.    . Potassium Chloride ER 20 MEQ TBCR Take 1 tablet by mouth every other day.   0  . risperiDONE (RISPERDAL) 1 MG tablet Take 1 tablet (1 mg total) by mouth 2 (two) times daily. 30 tablet 0   No current facility-administered medications for this visit.    Allergies  Allergen Reactions  . Immune Globulins Other (See Comments)    Acute renal failure  . Ciprofloxacin Swelling  . Flagyl [Metronidazole] Swelling  . Lisinopril Diarrhea  . Sulfa Antibiotics Other (See Comments)    blisters  . Requip [Ropinirole Hcl]     constipation  . Betamethasone Dipropionate Other (See  Comments)    Unknown  . Bupropion Hcl Other (See Comments)    Unknown  . Clobetasol Other (See Comments)    Unknown  . Codeine Other (See Comments)    Unknown  . Escitalopram Oxalate Other (See Comments)    Unknown  . Fluoxetine Hcl Other (See Comments)    Unknown  . Fluticasone-Salmeterol Other (See Comments)    Unknown  . Furosemide Other (See  Comments)    Unknown  . Meclizine Rash  . Paroxetine Other (See Comments)    Unknown  . Penicillins Other (See Comments)    Unknown reaction Has patient had a PCN reaction causing immediate rash, facial/tongue/throat swelling, SOB or lightheadedness with hypotension: unknown Has patient had a PCN reaction causing severe rash involving mucus membranes or skin necrosis: unknown Has patient had a PCN reaction that required hospitalization unknown Has patient had a PCN reaction occurring within the last 10 years: unknown If all of the above answers are "NO", then may proceed with Cephalosporin use. Unknown Unknown reaction Has patient had a PCN reaction causing immediate rash, facial/tongue/throat swelling, SOB or lightheadedness with hypotension: unknown Has patient had a PCN reaction causing severe rash involving mucus membranes or skin necrosis: unknown Has patient had a PCN reaction that required hospitalization unknown Has patient had a PCN reaction occurring within the last 10 years: unknown If all of the above answers are "NO", then may proceed with Cephalosporin use.   . Tacrolimus Other (See Comments)    Unknown  . Tetanus Toxoid Other (See Comments)    Unknown  . Tuberculin Purified Protein Derivative Other (See Comments)    Unknown     Review of Systems: All systems reviewed and negative except where noted in HPI.    Physical Exam: BP 102/66   Pulse 80   Ht 5\' 6"  (1.676 m)   Wt 235 lb (106.6 kg)   BMI 37.93 kg/m  Constitutional:  Obese white male in no acute distress. Psychiatric: Normal mood and affect. Behavior  is normal. EENT: Pupils normal.  Conjunctivae are normal. No scleral icterus. Neck supple.  Cardiovascular: Normal rate, regular rhythm. 1+ BLE edema Pulmonary/chest: Effort normal and breath sounds normal. No wheezing, rales or rhonchi. Abdominal: Soft, nondistended. Nontender. Bowel sounds active throughout. There are no masses palpable. No hepatomegaly. Lymphadenopathy: No cervical adenopathy noted. Neurological: Alert and oriented to person place and time. Skin: Skin is warm and dry. No rashes noted.   ASSESSMENT AND PLAN:  1. 66 yo male with recurrent solid food dysphagia. He has a hx of dermomyositis , esophageal dysmotility and a GEJ stricture dilated in 2014. Rule out recurrent stricture.  -Patient is due for surveillance colonoscopy. Will arrange with EGD with possible dilation to be done on same day at the hospital. The risks and benefits of EGD were discussed and the patient agrees to proceed.  -Advised patient to eat small bites, chew well with liquids in between bites to avoid food impaction.  2. Hx of adenomatous colon polyps. Due for 5 year surveilance colonoscopy in Aug 2019. No bowel changes, blood in stool or other alarm symptoms.  -Patient will be scheduled for a surveillance colonoscopy with possible polypectomy.  The risks and benefits of the procedure were discussed and the patient agrees to proceed.   3. Multiple drug allergies   4. Hx of seizure, last one a year ago. On medication.   5. Chronic diarrhea ( years) , takes imodium every day.   6. PAF, CHADS VAsc 3) , on amiodarone.   Tye Savoy, NP  11/11/2016, 12:03 PM   Addendum: Reviewed and agree with initial management. Pyrtle, Lajuan Lines, MD

## 2016-11-11 NOTE — Patient Instructions (Addendum)
If you are age 67 or older, your body mass index should be between 23-30. Your Body mass index is 37.93 kg/m. If this is out of the aforementioned range listed, please consider follow up with your Primary Care Provider.  If you are age 67 or younger, your body mass index should be between 19-25. Your Body mass index is 37.93 kg/m. If this is out of the aformentioned range listed, please consider follow up with your Primary Care Provider.   You have been scheduled for an endoscopy and colonoscopy at [facility]. Please follow the written instructions given to you at your visit today.  If you use inhalers (even only as needed), please bring them with you on the day of your procedure. Your physician has requested that you go to www.startemmi.com and enter the access code given to you at your visit today. This web site gives a general overview about your procedure. However, you should still follow specific instructions given to you by our office regarding your preparation for the procedure.  HOLD IMODIUM 2-3 days prior to procedure.  Advised patient to eat small bites, chew well with liquids in between bites to avoid food impaction.  Thank you for choosing me and [facility].   Tye Savoy, NP

## 2016-11-20 ENCOUNTER — Encounter (HOSPITAL_COMMUNITY): Payer: Self-pay | Admitting: Emergency Medicine

## 2016-11-20 ENCOUNTER — Emergency Department (HOSPITAL_COMMUNITY): Payer: Medicare Other

## 2016-11-20 ENCOUNTER — Observation Stay (HOSPITAL_COMMUNITY)
Admission: EM | Admit: 2016-11-20 | Discharge: 2016-11-22 | Disposition: A | Discharge status: Home / Self Care | Payer: Medicare Other | Attending: Family Medicine | Admitting: Family Medicine

## 2016-11-20 DIAGNOSIS — F419 Anxiety disorder, unspecified: Secondary | ICD-10-CM | POA: Insufficient documentation

## 2016-11-20 DIAGNOSIS — W19XXXA Unspecified fall, initial encounter: Secondary | ICD-10-CM

## 2016-11-20 DIAGNOSIS — J441 Chronic obstructive pulmonary disease with (acute) exacerbation: Secondary | ICD-10-CM | POA: Diagnosis not present

## 2016-11-20 DIAGNOSIS — F329 Major depressive disorder, single episode, unspecified: Secondary | ICD-10-CM | POA: Diagnosis not present

## 2016-11-20 DIAGNOSIS — M797 Fibromyalgia: Secondary | ICD-10-CM | POA: Diagnosis not present

## 2016-11-20 DIAGNOSIS — Z87891 Personal history of nicotine dependence: Secondary | ICD-10-CM | POA: Insufficient documentation

## 2016-11-20 DIAGNOSIS — Z888 Allergy status to other drugs, medicaments and biological substances status: Secondary | ICD-10-CM | POA: Diagnosis not present

## 2016-11-20 DIAGNOSIS — Z79899 Other long term (current) drug therapy: Secondary | ICD-10-CM | POA: Insufficient documentation

## 2016-11-20 DIAGNOSIS — E669 Obesity, unspecified: Secondary | ICD-10-CM | POA: Diagnosis not present

## 2016-11-20 DIAGNOSIS — E1140 Type 2 diabetes mellitus with diabetic neuropathy, unspecified: Secondary | ICD-10-CM | POA: Diagnosis not present

## 2016-11-20 DIAGNOSIS — Z880 Allergy status to penicillin: Secondary | ICD-10-CM | POA: Diagnosis not present

## 2016-11-20 DIAGNOSIS — G40909 Epilepsy, unspecified, not intractable, without status epilepticus: Secondary | ICD-10-CM | POA: Diagnosis not present

## 2016-11-20 DIAGNOSIS — R1310 Dysphagia, unspecified: Secondary | ICD-10-CM | POA: Insufficient documentation

## 2016-11-20 DIAGNOSIS — R569 Unspecified convulsions: Secondary | ICD-10-CM

## 2016-11-20 DIAGNOSIS — G8929 Other chronic pain: Secondary | ICD-10-CM | POA: Diagnosis not present

## 2016-11-20 DIAGNOSIS — R4182 Altered mental status, unspecified: Secondary | ICD-10-CM | POA: Diagnosis not present

## 2016-11-20 DIAGNOSIS — I2510 Atherosclerotic heart disease of native coronary artery without angina pectoris: Secondary | ICD-10-CM | POA: Insufficient documentation

## 2016-11-20 DIAGNOSIS — I252 Old myocardial infarction: Secondary | ICD-10-CM | POA: Diagnosis not present

## 2016-11-20 DIAGNOSIS — E785 Hyperlipidemia, unspecified: Secondary | ICD-10-CM | POA: Insufficient documentation

## 2016-11-20 DIAGNOSIS — M25531 Pain in right wrist: Secondary | ICD-10-CM | POA: Insufficient documentation

## 2016-11-20 DIAGNOSIS — I480 Paroxysmal atrial fibrillation: Secondary | ICD-10-CM | POA: Diagnosis not present

## 2016-11-20 DIAGNOSIS — I10 Essential (primary) hypertension: Secondary | ICD-10-CM | POA: Insufficient documentation

## 2016-11-20 DIAGNOSIS — K219 Gastro-esophageal reflux disease without esophagitis: Secondary | ICD-10-CM | POA: Insufficient documentation

## 2016-11-20 DIAGNOSIS — R0902 Hypoxemia: Secondary | ICD-10-CM | POA: Diagnosis not present

## 2016-11-20 DIAGNOSIS — Z7982 Long term (current) use of aspirin: Secondary | ICD-10-CM | POA: Insufficient documentation

## 2016-11-20 DIAGNOSIS — M069 Rheumatoid arthritis, unspecified: Secondary | ICD-10-CM | POA: Insufficient documentation

## 2016-11-20 DIAGNOSIS — S62101A Fracture of unspecified carpal bone, right wrist, initial encounter for closed fracture: Secondary | ICD-10-CM

## 2016-11-20 DIAGNOSIS — Z9181 History of falling: Secondary | ICD-10-CM | POA: Diagnosis not present

## 2016-11-20 DIAGNOSIS — D869 Sarcoidosis, unspecified: Secondary | ICD-10-CM | POA: Insufficient documentation

## 2016-11-20 DIAGNOSIS — R7981 Abnormal blood-gas level: Secondary | ICD-10-CM

## 2016-11-20 LAB — CBC WITH DIFFERENTIAL/PLATELET
BASOS ABS: 0 10*3/uL (ref 0.0–0.1)
Basophils Relative: 0 %
EOS ABS: 0.2 10*3/uL (ref 0.0–0.7)
EOS PCT: 1 %
HCT: 45.8 % (ref 39.0–52.0)
Hemoglobin: 14.2 g/dL (ref 13.0–17.0)
Lymphocytes Relative: 22 %
Lymphs Abs: 2.6 10*3/uL (ref 0.7–4.0)
MCH: 26.5 pg (ref 26.0–34.0)
MCHC: 31 g/dL (ref 30.0–36.0)
MCV: 85.6 fL (ref 78.0–100.0)
MONO ABS: 0.9 10*3/uL (ref 0.1–1.0)
Monocytes Relative: 7 %
Neutro Abs: 8.2 10*3/uL — ABNORMAL HIGH (ref 1.7–7.7)
Neutrophils Relative %: 70 %
PLATELETS: 254 10*3/uL (ref 150–400)
RBC: 5.35 MIL/uL (ref 4.22–5.81)
RDW: 14.9 % (ref 11.5–15.5)
WBC: 11.8 10*3/uL — AB (ref 4.0–10.5)

## 2016-11-20 LAB — COMPREHENSIVE METABOLIC PANEL
ALT: 13 U/L — AB (ref 17–63)
AST: 18 U/L (ref 15–41)
Albumin: 3.7 g/dL (ref 3.5–5.0)
Alkaline Phosphatase: 98 U/L (ref 38–126)
Anion gap: 6 (ref 5–15)
BUN: 12 mg/dL (ref 6–20)
CHLORIDE: 105 mmol/L (ref 101–111)
CO2: 30 mmol/L (ref 22–32)
CREATININE: 0.89 mg/dL (ref 0.61–1.24)
Calcium: 8.8 mg/dL — ABNORMAL LOW (ref 8.9–10.3)
GFR calc non Af Amer: >60 mL/min (ref >60)
Glucose, Bld: 112 mg/dL — ABNORMAL HIGH (ref 65–99)
POTASSIUM: 4.2 mmol/L (ref 3.5–5.1)
SODIUM: 141 mmol/L (ref 135–145)
Total Bilirubin: 0.7 mg/dL (ref 0.3–1.2)
Total Protein: 7.3 g/dL (ref 6.5–8.1)

## 2016-11-20 LAB — URINALYSIS, ROUTINE W REFLEX MICROSCOPIC
BILIRUBIN URINE: NEGATIVE
Glucose, UA: NEGATIVE mg/dL
Hgb urine dipstick: NEGATIVE
Ketones, ur: NEGATIVE mg/dL
Leukocytes, UA: NEGATIVE
NITRITE: NEGATIVE
PH: 8 (ref 5.0–8.0)
Protein, ur: NEGATIVE mg/dL
SPECIFIC GRAVITY, URINE: 1.012 (ref 1.005–1.030)

## 2016-11-20 LAB — I-STAT TROPONIN, ED: TROPONIN I, POC: 0 ng/mL (ref 0.00–0.08)

## 2016-11-20 LAB — VALPROIC ACID LEVEL

## 2016-11-20 LAB — I-STAT CG4 LACTIC ACID, ED: LACTIC ACID, VENOUS: 1.13 mmol/L (ref 0.5–1.9)

## 2016-11-20 MED ORDER — SODIUM CHLORIDE 0.9 % IV BOLUS (SEPSIS)
1000.0000 mL | Freq: Once | INTRAVENOUS | Status: AC
  Administered 2016-11-20: 1000 mL via INTRAVENOUS

## 2016-11-20 NOTE — ED Triage Notes (Signed)
Pt brought to ED by [facility] from home after having a fall, pt is having multiple falls for the past few weeks, pt lives by him self is having decrease mobility unable to get take his regular home meds and have something to eat., pt denies any LOC, no on blood thinner. C/o 10/10 left knee pain, right wrist and lower back pain that pt has a chronic pain history for.  BP 126/66 HR 88 R-14  SPO2 94%

## 2016-11-20 NOTE — ED Provider Notes (Addendum)
[facility] DEPT Provider Note   CSN: [id number] Arrival date & time: 11/20/16  2136     History   Chief Complaint Chief Complaint  Patient presents with  . Fall    HPI Patrick Holt is a 66 y.o. male.  HPI   66 year old man with a history of anxiety, bipolar affective disorder, A. fib and multiple falls who presents today stating that he has continued to have multiple falls for several years. Over the past several weeks he has fallen and injured his left knee. He states that he fell again and injured his left knee a second time today. He also is complaining of right wrist pain and low back pain with some chronicity feels that these were injured during his falls.He state he has Previously fractured the right wrist but now feels that it is worse after falling on it almost daily for the past 2 weeks. He denies striking his head or being on blood thinners. He denies neck pain, numbness, or lateralized weakness.  Past Medical History:  Diagnosis Date  . Anemia   . Anxiety   . Asthma   . Atrial fibrillation ([place]) 01/2012   with RVR  . Bipolar 1 disorder ([place])   . Cancer ([place])   . Collagen vascular disease ([place])   . Conversion disorder 06/2010  . COPD (chronic obstructive pulmonary disease) ([place])   . Dermatomyositis ([place])   . Dermatomyositis ([place])   . Diverticulitis   . Diverticulosis of colon   . Dysrhythmia    "irregular" (11/15/2012)  . Esophageal dysmotility   . Esophageal stricture   . Fibromyalgia   . Gastritis   . GERD (gastroesophageal reflux disease)   . Headache(784.0)    "severe; get them often" (11/15/2012)  . Hiatal hernia   . History of narcotic addiction ([place])   . Hx of adenomatous colonic polyps   . Hyperlipidemia   . Hypertension   . Internal hemorrhoids   . Ischemic heart disease   . Major depression    with acute psychotic break in 06/2010  . Myocardial infarct ([place])    mulitple (1999, 2000, 2004)  . Narcotic dependence ([place])   .  Nephrolithiasis   . Obesity   . OCD (obsessive compulsive disorder)   . Otosclerosis   . Paroxysmal A-fib ([place])   . Peripheral neuropathy   . Raynaud's disease   . Renal insufficiency   . Rheumatoid arthritis(714.0)   . Sarcoidosis   . Seizures ([place])   . Subarachnoid hemorrhage ([place]) 01/2012   with subdural  hematoma.   . Type II diabetes mellitus ([place])   . Urge incontinence   . Vertigo     Patient Active Problem List   Diagnosis Date Noted  . Pain in right wrist 02/27/2016  . Altered mental state 11/14/2015  . Severe dehydration 11/14/2015  . Hypernatremia 11/14/2015  . Difficulty in walking, not elsewhere classified   . Right wrist fracture   . Closed displaced fracture of ulnar styloid with routine healing   . Scapholunate ligament injury with no instability   . Falls frequently   . Hypoxia 11/02/2015  . Anxiety 06/06/2015  . Impaired mobility 04/28/2015  . Lumbar spondylosis with myelopathy 03/07/2015  . Nocturia 12/03/2014  . Chronic venous insufficiency 09/23/2014  . Poor social situation 07/30/2014  . Schizophrenia, acute ([place])   . Schizoaffective disorder, unspecified type ([place])   . Decreased visual acuity 06/27/2014  . COPD exacerbation ([place])   . Right sided weakness 01/04/2014  .  Essential hypertension, benign   . Atrial fibrillation, unspecified   . Radiculopathy of cervical region   . Polyuria 12/07/2013  . Erectile dysfunction 12/07/2013  . Diverticulitis 08/01/2013  . Acute diverticulitis 08/01/2013  . Hematochezia 06/04/2013  . Hypokalemia 10/03/2012  . Benign neoplasm of colon 09/27/2012  . Stricture and stenosis of esophagus 09/27/2012  . Diverticulitis of colon without hemorrhage 07/26/2012  . Polypharmacy 03/26/2012  . Seizures ([place]) 02/10/2012  . Atrial fibrillation ([place]) 02/05/2012  . Coronary atherosclerosis of native coronary artery 02/05/2012  . Radicular low back pain 03/09/2011  . History of seizure disorder 01/29/2011  . Irritable  bowel syndrome (IBS) 06/24/2010  . Urge incontinence 06/19/2010  . TOBACCO USER 11/23/2008  . DEPRESSION, MAJOR 05/31/2008  . ISCHEMIC HEART DISEASE 05/31/2008  . RAYNAUD'S DISEASE 05/31/2008  . HIATAL HERNIA 05/31/2008  . Rheumatoid arthritis ([place]) 05/31/2008  . FIBROMYALGIA 05/31/2008  . Hyperlipidemia 01/26/2008  . Anxiety state 01/26/2008  . HEMORRHOIDS, INTERNAL 01/26/2008  . COPD (chronic obstructive pulmonary disease) ([place]) 01/26/2008  . ESOPHAGEAL MOTILITY DISORDER 01/26/2008  . GERD 01/26/2008  . Dermatomyositis ([place]) 01/26/2008    Past Surgical History:  Procedure Laterality Date  . BACK SURGERY    . CARDIAC CATHETERIZATION    . CARPAL TUNNEL RELEASE Bilateral   . CATARACT EXTRACTION W/ INTRAOCULAR LENS IMPLANT Left   . COLONOSCOPY N/A 09/27/2012   Procedure: COLONOSCOPY;  Surgeon: Lafayette Dragon, MD;  Location: [facility] END[facility]COPY;  Service: Endoscopy;  Laterality: N/A;  . ESOPHAGOGASTRODUODENOSCOPY N/A 09/27/2012   Procedure: ESOPHAGOGASTRODUODENOSCOPY (EGD);  Surgeon: Lafayette Dragon, MD;  Location: [facility];  Service: Endoscopy;  Laterality: N/A;  . KNEE ARTHROSCOPY W/ MENISCAL REPAIR Left 2009  . LUMBAR DISC SURGERY    . LYMPH NODE DISSECTION Right 1970's   "neck; dr thought I had Hodgkins; turned out to be sarcoidosis" (11/15/2012)  . squamous papilloma   2010   removed by Dr. Constance Holster ENT, noted on tongue  . TONSILLECTOMY         Home Medications    Prior to Admission medications   Medication Sig Start Date End Date Taking? Authorizing Provider  ALPRAZolam Duanne Moron) 1 MG tablet Take 1 mg by mouth 3 (three) times daily.   Yes [provider]  amiodarone (PACERONE) 200 MG tablet Take 0.5 tablets (100 mg total) by mouth daily. 10/17/15  Yes [name], MD  amLODipine (NORVASC) 5 MG tablet Take 1 tablet (5 mg total) by mouth daily. 11/05/15  Yes Canfield Bing, DO  aspirin EC 81 MG tablet Take 1 tablet (81 mg total) by mouth daily. 10/17/15  Yes [name],  MD  atorvastatin (LIPITOR) 40 MG tablet Take 1 tablet (40 mg total) by mouth daily. Patient taking differently: Take 40 mg by mouth at bedtime.  10/17/15  Yes [name], MD  divalproex (DEPAKOTE ER) 250 MG 24 hr tablet Take 3 tablets (750 mg total) by mouth daily. 12/15/15  Yes [name], MD  esomeprazole (NEXIUM) 40 MG capsule Take 40 mg by mouth daily. 07/26/16  Yes [provider]  ipratropium-albuterol (DUONEB) 0.5-2.5 (3) MG/3ML SOLN Inhale 3 mLs into the lungs every 6 (six) hours as needed. Patient taking differently: Inhale 3 mLs into the lungs every 6 (six) hours as needed (for breathing).  11/17/15  Yes Elgergawy, Silver Huguenin, MD  levETIRAcetam (KEPPRA) 500 MG tablet TAKE 1 TABLET(500 MG) BY MOUTH TWICE DAILY 10/17/15  Yes [name], MD  loperamide (IMODIUM) 2 MG  capsule TAKE 1 TO 2 CAPSULES BY MOUTH 3 TIMES A DAY AS NEEDED FOR DIARRHEA Patient taking differently: Take 2 mg by mouth every 8 (eight) hours as needed for diarrhea or loose stools.  08/20/15  Yes [name], MD  Multiple Vitamin (MULTIVITAMIN WITH MINERALS) TABS tablet Take 1 tablet by mouth daily. 10/17/15  Yes [name], MD  oxybutynin (DITROPAN) 5 MG tablet Take 5 mg by mouth daily. 07/26/16  Yes [provider]  Potassium Chloride ER 20 MEQ TBCR Take 1 tablet by mouth every other day.  11/29/14  Yes [provider]  risperiDONE (RISPERDAL) 1 MG tablet Take 1 tablet (1 mg total) by mouth 2 (two) times daily. 12/15/15  Yes [name], MD    Family History Family History  Problem Relation Age of Onset  . Alcohol abuse Mother   . Depression Mother   . Heart disease Mother   . Diabetes Mother   . Stroke Mother   . Coronary artery disease Father   . Diabetes Other        1/2 brother  . Hepatitis Brother     Social History Social History  Substance Use Topics  . Smoking status: Former Smoker    Packs/day: 0.50    Years: 30.00    Types: Cigarettes    Quit date: 12/15/2014  .  Smokeless tobacco: Never Used     Comment: 3 cigs a day  . Alcohol use No     Comment: Alcohol stopped in September of 2014     Allergies   Immune globulins; Ciprofloxacin; Flagyl [metronidazole]; Lisinopril; Sulfa antibiotics; Requip [ropinirole hcl]; Betamethasone dipropionate; Bupropion hcl; Clobetasol; Codeine; Escitalopram oxalate; Fluoxetine hcl; Fluticasone-salmeterol; Furosemide; Meclizine; Paroxetine; Penicillins; Tacrolimus; Tetanus toxoid; and Tuberculin purified protein derivative   Review of Systems Review of Systems  All other systems reviewed and are negative.    Physical Exam Updated Vital Signs BP 132/72 (BP Location: Right Arm)   Pulse (!) 105   Temp 99.5 F (37.5 C) (Rectal)   Resp 18   Ht 1.676 m (5\' 6" )   Wt 106.6 kg (235 lb)   SpO2 94%   BMI 37.93 kg/m   Physical Exam  Constitutional: He is oriented to person, place, and time. He appears well-developed and well-nourished.  HENT:  Head: Normocephalic and atraumatic.  Right Ear: External ear normal.  Left Ear: External ear normal.  Mucous membranes somewhat dry  Eyes: Pupils are equal, round, and reactive to light. EOM are normal.  Neck: Normal range of motion. Neck supple.  Cardiovascular: Normal rate.   Pulmonary/Chest: Effort normal and breath sounds normal.  Abdominal: Soft. Bowel sounds are normal.  Musculoskeletal: Normal range of motion. He exhibits tenderness.  Right wrist deformity Tenderness right wrist, left knee, and low back. Distal pulses are intact bilateral arms and bilateral lower extremities  Neurological: He is alert and oriented to person, place, and time. He displays normal reflexes. No cranial nerve deficit. Coordination normal.  Skin: Skin is warm. Capillary refill takes less than 2 seconds.  Nursing note and vitals reviewed.    ED Treatments / Results  Labs (all labs ordered are listed, but only abnormal results are displayed) Labs Reviewed  CBC WITH  DIFFERENTIAL/PLATELET - Abnormal; Notable for the following:       Result Value   WBC 11.8 (*)    Neutro Abs 8.2 (*)    All other components within normal limits  COMPREHENSIVE METABOLIC PANEL  URINALYSIS, ROUTINE W  REFLEX MICROSCOPIC  VALPROIC ACID LEVEL  I-STAT CG4 LACTIC ACID, ED  I-STAT TROPONIN, ED    EKG  EKG Interpretation None       Radiology Dg Chest 1 View  Result Date: 11/20/2016 CLINICAL DATA:  Altered mental status and cough. Multiple falls over the past few weeks including today. EXAM: CHEST 1 VIEW COMPARISON:  07/30/2016 FINDINGS: Shallow inspiration with linear atelectasis in the lung bases. Heart size and pulmonary vascularity are normal for technique. No consolidation or airspace disease. No blunting of costophrenic angles. Old right rib fractures. Calcification of the aorta. IMPRESSION: Atelectasis in the lung bases. Aortic atherosclerosis. No evidence of active pulmonary disease. Electronically Signed   By: Lucienne Capers M.D.   On: 11/20/2016 22:49   Ct Head Wo Contrast  Result Date: 11/20/2016 CLINICAL DATA:  Patient fell several times over the past several weeks. Ataxia. EXAM: CT HEAD WITHOUT CONTRAST TECHNIQUE: Contiguous axial images were obtained from the base of the skull through the vertex without intravenous contrast. COMPARISON:  11/14/2015 CT. FINDINGS: BRAIN: There is mild sulcal and ventricular prominence consistent with superficial and central atrophy. No intraparenchymal hemorrhage, mass effect nor midline shift. Periventricular and subcortical white matter hypodensities consistent with chronic small vessel ischemic disease are identified. No acute large vascular territory infarcts. No abnormal extra-axial fluid collections. Basal cisterns are not effaced and midline. VASCULAR: Moderate calcific atherosclerosis of the carotid siphons. SKULL: No skull fracture. No significant scalp soft tissue swelling. SINUSES/ORBITS: The mastoid air-cells are clear. The  included paranasal sinuses are well-aerated.The included ocular globes and orbital contents are non-suspicious. Status post left cataract extraction. OTHER: None. IMPRESSION: Stable cerebral atrophy with chronic small vessel ischemia. No acute intracranial abnormality. Electronically Signed   By: [name] Royalty M.D.   On: 11/20/2016 23:24   Dg Knee Complete 4 Views Left  Result Date: 11/20/2016 CLINICAL DATA:  Altered mental status and cough.  Patient fell. EXAM: LEFT KNEE - COMPLETE 4+ VIEW COMPARISON:  None. FINDINGS: No evidence of fracture, dislocation, or joint effusion. No evidence of arthropathy or other focal bone abnormality. Vascular calcifications. IMPRESSION: No acute bony abnormalities. Electronically Signed   By: Lucienne Capers M.D.   On: 11/20/2016 22:50    Procedures Procedures (including critical care time)  Medications Ordered in ED Medications  sodium chloride 0.9 % bolus 1,000 mL (not administered)     Initial Impression / Assessment and Plan / ED Course  I have reviewed the triage vital signs and the nursing notes.  Pertinent labs & imaging results that were available during my care of the patient were reviewed by me and considered in my medical decision making (see chart for details).     This is a 66 year old man with a history of neuropsychiatric disorder and multiple medical problems. He has also had multiple falls in the past. However on exam today it appears that his falls have increased in frequency. He presents today with a right wrist fracture of unknown duration however it is new from x-rays obtained in September 2017. He is unable to tell me exactly when he broke the same and he has fallen and injured it multiple times. He is also a smoker and his oxygen saturations have been 86-87% here. He has some dyspnea with this however he is not overtly wheezing. He is being given albuterol here. Plan admission for further evaluation of falls, generalized weakness, and  decreased oxygen saturations. Discussed with Dr. Maudie Mercury and he will see for admission Hand surgery being  consulted and volar splint to be placed.  Final Clinical Impressions(s) / ED Diagnoses   Final diagnoses:  Fall, initial encounter  Closed fracture of right wrist, initial encounter  Low oxygen saturation    New Prescriptions New Prescriptions   No medications on file     Pattricia Boss, MD 11/21/16 [place], [place], MD 11/21/16 [phone]

## 2016-11-21 ENCOUNTER — Encounter (HOSPITAL_COMMUNITY): Payer: Self-pay | Admitting: Internal Medicine

## 2016-11-21 DIAGNOSIS — I480 Paroxysmal atrial fibrillation: Secondary | ICD-10-CM | POA: Diagnosis not present

## 2016-11-21 DIAGNOSIS — M069 Rheumatoid arthritis, unspecified: Secondary | ICD-10-CM | POA: Diagnosis not present

## 2016-11-21 DIAGNOSIS — J441 Chronic obstructive pulmonary disease with (acute) exacerbation: Principal | ICD-10-CM

## 2016-11-21 DIAGNOSIS — R1310 Dysphagia, unspecified: Secondary | ICD-10-CM | POA: Diagnosis not present

## 2016-11-21 DIAGNOSIS — R7981 Abnormal blood-gas level: Secondary | ICD-10-CM | POA: Diagnosis not present

## 2016-11-21 LAB — CBC
HEMATOCRIT: 40.4 % (ref 39.0–52.0)
HEMOGLOBIN: 12.2 g/dL — AB (ref 13.0–17.0)
MCH: 25.9 pg — ABNORMAL LOW (ref 26.0–34.0)
MCHC: 30.2 g/dL (ref 30.0–36.0)
MCV: 85.8 fL (ref 78.0–100.0)
Platelets: 244 10*3/uL (ref 150–400)
RBC: 4.71 MIL/uL (ref 4.22–5.81)
RDW: 14.7 % (ref 11.5–15.5)
WBC: 9.5 10*3/uL (ref 4.0–10.5)

## 2016-11-21 LAB — I-STAT ARTERIAL BLOOD GAS, ED
Acid-Base Excess: 2 mmol/L (ref 0.0–2.0)
Bicarbonate: 28 mmol/L (ref 20.0–28.0)
O2 Saturation: 79 %
PCO2 ART: 48.6 mmHg — AB (ref 32.0–48.0)
Patient temperature: 98.6
TCO2: 29 mmol/L (ref 22–32)
pH, Arterial: 7.368 (ref 7.350–7.450)
pO2, Arterial: 46 mmHg — ABNORMAL LOW (ref 83.0–108.0)

## 2016-11-21 LAB — COMPREHENSIVE METABOLIC PANEL
ALK PHOS: 83 U/L (ref 38–126)
ALT: 11 U/L — ABNORMAL LOW (ref 17–63)
ANION GAP: 8 (ref 5–15)
AST: 17 U/L (ref 15–41)
Albumin: 3.1 g/dL — ABNORMAL LOW (ref 3.5–5.0)
BILIRUBIN TOTAL: 0.6 mg/dL (ref 0.3–1.2)
BUN: 10 mg/dL (ref 6–20)
CALCIUM: 8.1 mg/dL — AB (ref 8.9–10.3)
CO2: 26 mmol/L (ref 22–32)
Chloride: 105 mmol/L (ref 101–111)
Creatinine, Ser: 0.96 mg/dL (ref 0.61–1.24)
GLUCOSE: 189 mg/dL — AB (ref 65–99)
POTASSIUM: 3.6 mmol/L (ref 3.5–5.1)
Sodium: 139 mmol/L (ref 135–145)
TOTAL PROTEIN: 6.1 g/dL — AB (ref 6.5–8.1)

## 2016-11-21 LAB — GLUCOSE, CAPILLARY
GLUCOSE-CAPILLARY: 77 mg/dL (ref 65–99)
GLUCOSE-CAPILLARY: 77 mg/dL (ref 65–99)
GLUCOSE-CAPILLARY: 220 mg/dL — AB (ref 65–99)
GLUCOSE-CAPILLARY: 159 mg/dL — AB (ref 65–99)
Glucose-Capillary: 127 mg/dL — ABNORMAL HIGH (ref 65–99)

## 2016-11-21 LAB — HEMOGLOBIN A1C
HEMOGLOBIN A1C: 6.2 % — AB (ref 4.8–5.6)
MEAN PLASMA GLUCOSE: 131.24 mg/dL

## 2016-11-21 MED ORDER — ACETAMINOPHEN 650 MG RE SUPP: 650.0000 mg | Freq: Four times a day (QID) | RECTAL | Status: DC | PRN

## 2016-11-21 MED ORDER — INSULIN ASPART 100 UNIT/ML ~~LOC~~ SOLN: 0.0000 [IU] | Freq: Every day | SUBCUTANEOUS | Status: DC

## 2016-11-21 MED ORDER — ENOXAPARIN SODIUM 40 MG/0.4ML ~~LOC~~ SOLN
40.0000 mg | SUBCUTANEOUS | Status: DC
Start: 2016-11-21 — End: 2016-11-22
  Administered 2016-11-21 – 2016-11-22 (×2): 40 mg via SUBCUTANEOUS
  Filled 2016-11-21 (×2): qty 0.4

## 2016-11-21 MED ORDER — LOPERAMIDE HCL 2 MG PO CAPS
2.0000 mg | ORAL_CAPSULE | Freq: Three times a day (TID) | ORAL | Status: DC | PRN
  Administered 2016-11-21: 2 mg via ORAL
  Filled 2016-11-21: qty 1

## 2016-11-21 MED ORDER — ALBUTEROL SULFATE (2.5 MG/3ML) 0.083% IN NEBU
5.0000 mg | INHALATION_SOLUTION | Freq: Once | RESPIRATORY_TRACT | Status: AC
  Administered 2016-11-21: 5 mg via RESPIRATORY_TRACT
  Filled 2016-11-21: qty 6

## 2016-11-21 MED ORDER — POTASSIUM CHLORIDE CRYS ER 10 MEQ PO TBCR
20.0000 meq | EXTENDED_RELEASE_TABLET | ORAL | Status: DC
  Administered 2016-11-21: 20 meq via ORAL
  Filled 2016-11-21 (×3): qty 2

## 2016-11-21 MED ORDER — DEXTROSE 5 % IV SOLN
500.0000 mg | INTRAVENOUS | Status: DC
  Administered 2016-11-21 – 2016-11-22 (×2): 500 mg via INTRAVENOUS
  Filled 2016-11-21 (×2): qty 500

## 2016-11-21 MED ORDER — RISPERIDONE 1 MG PO TABS
1.0000 mg | ORAL_TABLET | Freq: Two times a day (BID) | ORAL | Status: DC
  Administered 2016-11-21 – 2016-11-22 (×3): 1 mg via ORAL
  Filled 2016-11-21 (×4): qty 1

## 2016-11-21 MED ORDER — ACETAMINOPHEN 325 MG PO TABS: 650.0000 mg | ORAL_TABLET | Freq: Four times a day (QID) | ORAL | Status: DC | PRN

## 2016-11-21 MED ORDER — AMLODIPINE BESYLATE 5 MG PO TABS
5.0000 mg | ORAL_TABLET | Freq: Every day | ORAL | Status: DC
  Administered 2016-11-21 – 2016-11-22 (×2): 5 mg via ORAL
  Filled 2016-11-21 (×2): qty 1

## 2016-11-21 MED ORDER — OXYBUTYNIN CHLORIDE 5 MG PO TABS
5.0000 mg | ORAL_TABLET | Freq: Every day | ORAL | Status: DC
  Administered 2016-11-21 – 2016-11-22 (×2): 5 mg via ORAL
  Filled 2016-11-21 (×2): qty 1

## 2016-11-21 MED ORDER — ATORVASTATIN CALCIUM 40 MG PO TABS
40.0000 mg | ORAL_TABLET | Freq: Every day | ORAL | Status: DC
  Administered 2016-11-21: 40 mg via ORAL
  Filled 2016-11-21: qty 1

## 2016-11-21 MED ORDER — ADULT MULTIVITAMIN W/MINERALS CH
1.0000 | ORAL_TABLET | Freq: Every day | ORAL | Status: DC
  Administered 2016-11-21 – 2016-11-22 (×2): 1 via ORAL
  Filled 2016-11-21 (×2): qty 1

## 2016-11-21 MED ORDER — INSULIN ASPART 100 UNIT/ML ~~LOC~~ SOLN
0.0000 [IU] | Freq: Three times a day (TID) | SUBCUTANEOUS | Status: DC
  Administered 2016-11-21: 1 [IU] via SUBCUTANEOUS
  Administered 2016-11-21: 5 [IU] via SUBCUTANEOUS

## 2016-11-21 MED ORDER — ALPRAZOLAM 0.5 MG PO TABS
1.0000 mg | ORAL_TABLET | Freq: Three times a day (TID) | ORAL | Status: DC
  Administered 2016-11-21 – 2016-11-22 (×4): 1 mg via ORAL
  Filled 2016-11-21 (×4): qty 2

## 2016-11-21 MED ORDER — DIVALPROEX SODIUM ER 500 MG PO TB24
750.0000 mg | ORAL_TABLET | Freq: Every day | ORAL | Status: DC
  Administered 2016-11-21 – 2016-11-22 (×2): 750 mg via ORAL
  Filled 2016-11-21 (×2): qty 1

## 2016-11-21 MED ORDER — AMIODARONE HCL 200 MG PO TABS
100.0000 mg | ORAL_TABLET | Freq: Every day | ORAL | Status: DC
  Administered 2016-11-21: 12:00:00 via ORAL
  Administered 2016-11-22: 100 mg via ORAL
  Filled 2016-11-21 (×3): qty 1

## 2016-11-21 MED ORDER — ARFORMOTEROL TARTRATE 15 MCG/2ML IN NEBU
15.0000 ug | INHALATION_SOLUTION | Freq: Two times a day (BID) | RESPIRATORY_TRACT | Status: DC
  Administered 2016-11-21: 15 ug via RESPIRATORY_TRACT
  Filled 2016-11-21 (×3): qty 2

## 2016-11-21 MED ORDER — ASPIRIN EC 81 MG PO TBEC
81.0000 mg | DELAYED_RELEASE_TABLET | Freq: Every day | ORAL | Status: DC
  Administered 2016-11-21 – 2016-11-22 (×2): 81 mg via ORAL
  Filled 2016-11-21 (×2): qty 1

## 2016-11-21 MED ORDER — LEVETIRACETAM 500 MG PO TABS
500.0000 mg | ORAL_TABLET | Freq: Two times a day (BID) | ORAL | Status: DC
  Administered 2016-11-21 – 2016-11-22 (×4): 500 mg via ORAL
  Filled 2016-11-21 (×4): qty 1

## 2016-11-21 MED ORDER — PREDNISONE 10 MG PO TABS
60.0000 mg | ORAL_TABLET | Freq: Every day | ORAL | Status: DC
  Administered 2016-11-21 – 2016-11-22 (×2): 60 mg via ORAL
  Filled 2016-11-21 (×2): qty 1

## 2016-11-21 MED ORDER — BUDESONIDE 0.25 MG/2ML IN SUSP
0.2500 mg | Freq: Two times a day (BID) | RESPIRATORY_TRACT | Status: DC
  Administered 2016-11-21: 0.25 mg via RESPIRATORY_TRACT
  Filled 2016-11-21 (×2): qty 2

## 2016-11-21 MED ORDER — IPRATROPIUM-ALBUTEROL 0.5-2.5 (3) MG/3ML IN SOLN: 3.0000 mL | Freq: Four times a day (QID) | RESPIRATORY_TRACT | Status: DC | PRN

## 2016-11-21 MED ORDER — PANTOPRAZOLE SODIUM 40 MG PO TBEC
40.0000 mg | DELAYED_RELEASE_TABLET | Freq: Every day | ORAL | Status: DC
  Administered 2016-11-21 – 2016-11-22 (×2): 40 mg via ORAL
  Filled 2016-11-21 (×2): qty 1

## 2016-11-21 NOTE — Progress Notes (Signed)
Orthopedic Tech Progress Note Patient Details:  Patrick Holt [birth year] [id number]  Ortho Devices Type of Ortho Device: Volar splint, Arm sling Ortho Device/Splint Location: rue Ortho Device/Splint Interventions: Ordered, Application, Adjustment   Karolee Stamps 11/21/2016, 1:04 AM

## 2016-11-21 NOTE — H&P (Signed)
TRH H&P   Patient Demographics:    Patrick Holt, is a 66 y.o. male  MRN: [id number]   DOB - 12/22/1950  Admit Date - 11/20/2016  Outpatient Primary MD for the patient is [facility]  Referring MD/NP/PA: Pattricia Boss  Outpatient Specialists:   Patient coming from: home  Chief Complaint  Patient presents with  . Fall      HPI:    Patrick Holt  is a 66 y.o. male, w rheumatoid arthritis, sarcoid, dermatomyositis, Pafib, seizure, dm2, diabetic neuropathy, apparently has has recurrent falls at home and complains of right wrist pain.    In ED, wrist xray=> negative for acute process.   IMPRESSION: Significant deformity of the distal radius and to a lesser degree ulna from previously identified fractures. Acquired ulnar plus variance. Torn scapholunate ligament. Mild radiocarpal degenerative changes and diffuse osseous demineralization.  Pt was also noted to be hypoxic pox 83% on RA, though this apppears to be a poor wave reading, ED requested that the patient be admitted due to concern about hypoxia.    Review of systems:    In addition to the HPI above,  No Fever-chills, No Headache, No changes with Vision or hearing, No problems swallowing food or Liquids, No Chest pain + rare cough, slight dyspnea.  No Abdominal pain, No Nausea or Vommitting, Bowel movements are regular, No Blood in stool or Urine, No dysuria, No new skin rashes or bruises,  No new weakness, tingling, numbness in any extremity, No recent weight gain or loss, No polyuria, polydypsia or polyphagia, No significant Mental Stressors.  A full 10 point Review of Systems was done, except as stated above, all other Review of Systems were negative.   With Past History of the following :    Past Medical History:  Diagnosis Date  . Anemia   . Anxiety   . Asthma   .  Atrial fibrillation ([place]) 01/2012   with RVR  . Bipolar 1 disorder ([place])   . Cancer ([place])   . Collagen vascular disease ([place])   . Conversion disorder 06/2010  . COPD (chronic obstructive pulmonary disease) ([place])   . Dermatomyositis ([place])   . Dermatomyositis ([place])   . Diverticulitis   . Diverticulosis of colon   . Dysrhythmia    "irregular" (11/15/2012)  . Esophageal dysmotility   . Esophageal stricture   . Fibromyalgia   . Gastritis   . GERD (gastroesophageal reflux disease)   . Headache(784.0)    "severe; get them often" (11/15/2012)  . Hiatal hernia   . History of narcotic addiction ([place])   . Hx of adenomatous colonic polyps   . Hyperlipidemia   . Hypertension   . Internal hemorrhoids   . Ischemic heart disease   . Major depression    with acute psychotic break in 06/2010  . Myocardial infarct ([place])    mulitple (  1999, 2000, 2004)  . Narcotic dependence ([place])   . Nephrolithiasis   . Obesity   . OCD (obsessive compulsive disorder)   . Otosclerosis   . Paroxysmal A-fib ([place])   . Peripheral neuropathy   . Raynaud's disease   . Renal insufficiency   . Rheumatoid arthritis(714.0)   . Sarcoidosis   . Seizures ([place])   . Subarachnoid hemorrhage ([place]) 01/2012   with subdural  hematoma.   . Type II diabetes mellitus ([place])   . Urge incontinence   . Vertigo       Past Surgical History:  Procedure Laterality Date  . BACK SURGERY    . CARDIAC CATHETERIZATION    . CARPAL TUNNEL RELEASE Bilateral   . CATARACT EXTRACTION W/ INTRAOCULAR LENS IMPLANT Left   . COLONOSCOPY N/A 09/27/2012   Procedure: COLONOSCOPY;  Surgeon: Lafayette Dragon, MD;  Location: [facility];  Service: Endoscopy;  Laterality: N/A;  . ESOPHAGOGASTRODUODENOSCOPY N/A 09/27/2012   Procedure: ESOPHAGOGASTRODUODENOSCOPY (EGD);  Surgeon: Lafayette Dragon, MD;  Location: [facility];  Service: Endoscopy;  Laterality: N/A;  . KNEE ARTHROSCOPY W/ MENISCAL REPAIR Left 2009  . LUMBAR DISC SURGERY    . LYMPH NODE  DISSECTION Right 1970's   "neck; dr thought I had Hodgkins; turned out to be sarcoidosis" (11/15/2012)  . squamous papilloma   2010   removed by Dr. Constance Holster ENT, noted on tongue  . TONSILLECTOMY        Social History:     Social History  Substance Use Topics  . Smoking status: Former Smoker    Packs/day: 0.50    Years: 30.00    Types: Cigarettes    Quit date: 12/15/2014  . Smokeless tobacco: Never Used     Comment: 3 cigs a day  . Alcohol use No     Comment: Alcohol stopped in September of 2014     Lives - at home  Mobility - walks by self   Family History :     Family History  Problem Relation Age of Onset  . Alcohol abuse Mother   . Depression Mother   . Heart disease Mother   . Diabetes Mother   . Stroke Mother   . Coronary artery disease Father   . Diabetes Other        1/2 brother  . Hepatitis Brother      Home Medications:   Prior to Admission medications   Medication Sig Start Date End Date Taking? Authorizing Provider  ALPRAZolam Duanne Moron) 1 MG tablet Take 1 mg by mouth 3 (three) times daily.   Yes [provider]  amiodarone (PACERONE) 200 MG tablet Take 0.5 tablets (100 mg total) by mouth daily. 10/17/15  Yes [name], MD  amLODipine (NORVASC) 5 MG tablet Take 1 tablet (5 mg total) by mouth daily. 11/05/15  Yes  Bing, DO  aspirin EC 81 MG tablet Take 1 tablet (81 mg total) by mouth daily. 10/17/15  Yes [name], MD  atorvastatin (LIPITOR) 40 MG tablet Take 1 tablet (40 mg total) by mouth daily. Patient taking differently: Take 40 mg by mouth at bedtime.  10/17/15  Yes [name], MD  divalproex (DEPAKOTE ER) 250 MG 24 hr tablet Take 3 tablets (750 mg total) by mouth daily. 12/15/15  Yes [name], MD  esomeprazole (NEXIUM) 40 MG capsule Take 40 mg by mouth daily. 07/26/16  Yes [provider]  ipratropium-albuterol (DUONEB) 0.5-2.5 (3) MG/3ML SOLN Inhale 3 mLs into  the lungs every 6 (six) hours as needed. Patient  taking differently: Inhale 3 mLs into the lungs every 6 (six) hours as needed (for breathing).  11/17/15  Yes Elgergawy, Silver Huguenin, MD  levETIRAcetam (KEPPRA) 500 MG tablet TAKE 1 TABLET(500 MG) BY MOUTH TWICE DAILY 10/17/15  Yes Gonfa, Taye T, MD  loperamide (IMODIUM) 2 MG capsule TAKE 1 TO 2 CAPSULES BY MOUTH 3 TIMES A DAY AS NEEDED FOR DIARRHEA Patient taking differently: Take 2 mg by mouth every 8 (eight) hours as needed for diarrhea or loose stools.  08/20/15  Yes [name], MD  Multiple Vitamin (MULTIVITAMIN WITH MINERALS) TABS tablet Take 1 tablet by mouth daily. 10/17/15  Yes [name], MD  oxybutynin (DITROPAN) 5 MG tablet Take 5 mg by mouth daily. 07/26/16  Yes [provider]  Potassium Chloride ER 20 MEQ TBCR Take 1 tablet by mouth every other day.  11/29/14  Yes [provider]  risperiDONE (RISPERDAL) 1 MG tablet Take 1 tablet (1 mg total) by mouth 2 (two) times daily. 12/15/15  Yes [name], MD     Allergies:     Allergies  Allergen Reactions  . Immune Globulins Other (See Comments)    Acute renal failure  . Ciprofloxacin Swelling  . Flagyl [Metronidazole] Swelling  . Lisinopril Diarrhea  . Sulfa Antibiotics Other (See Comments)    blisters  . Requip [Ropinirole Hcl]     constipation  . Betamethasone Dipropionate Other (See Comments)    Unknown  . Bupropion Hcl Other (See Comments)    Unknown  . Clobetasol Other (See Comments)    Unknown  . Codeine Other (See Comments)    Unknown  . Escitalopram Oxalate Other (See Comments)    Unknown  . Fluoxetine Hcl Other (See Comments)    Unknown  . Fluticasone-Salmeterol Other (See Comments)    Unknown  . Furosemide Other (See Comments)    Unknown  . Meclizine Rash  . Paroxetine Other (See Comments)    Unknown  . Penicillins Other (See Comments)    Unknown reaction Has patient had a PCN reaction causing immediate rash, facial/tongue/throat swelling, SOB or lightheadedness with hypotension:  unknown Has patient had a PCN reaction causing severe rash involving mucus membranes or skin necrosis: unknown Has patient had a PCN reaction that required hospitalization unknown Has patient had a PCN reaction occurring within the last 10 years: unknown If all of the above answers are "NO", then may proceed with Cephalosporin use. Unknown Unknown reaction Has patient had a PCN reaction causing immediate rash, facial/tongue/throat swelling, SOB or lightheadedness with hypotension: unknown Has patient had a PCN reaction causing severe rash involving mucus membranes or skin necrosis: unknown Has patient had a PCN reaction that required hospitalization unknown Has patient had a PCN reaction occurring within the last 10 years: unknown If all of the above answers are "NO", then may proceed with Cephalosporin use.   . Tacrolimus Other (See Comments)    Unknown  . Tetanus Toxoid Other (See Comments)    Unknown  . Tuberculin Purified Protein Derivative Other (See Comments)    Unknown     Physical Exam:   Vitals  Blood pressure (!) 148/98, pulse 88, temperature 99.5 F (37.5 C), temperature source Rectal, resp. rate 14, height 5\' 6"  (1.676 m), weight 106.6 kg (235 lb), SpO2 (!) 83 %.   1. General  lying in bed in NAD,   2. Normal affect and insight, Not Suicidal or Homicidal, Awake  Alert, Oriented X 3.  3. No F.N deficits, ALL C.Nerves Intact, Strength 5/5 all 4 extremities, Sensation intact all 4 extremities, Plantars down going.  4. Ears and Eyes appear Normal, Conjunctivae clear, PERRLA. Moist Oral Mucosa.  5. Supple Neck, No JVD, No cervical lymphadenopathy appriciated, No Carotid Bruits.  6. Symmetrical Chest wall movement, Good air movement bilaterally, + bilatera l exp wheezing.   7. RRR, No Gallops, Rubs or Murmurs, No Parasternal Heave.  8. Positive Bowel Sounds, Abdomen Soft, No tenderness, No organomegaly appriciated,No rebound -guarding or rigidity.  9.  No Cyanosis,  Normal Skin Turgor, No Skin Rash or Bruise.  10. Good muscle tone,  joints appear normal , no effusions, Normal ROM.  11. No Palpable Lymph Nodes in Neck or Axillae    Data Review:    CBC  Recent Labs Lab 11/20/16 2216  WBC 11.8*  HGB 14.2  HCT 45.8  PLT 254  MCV 85.6  MCH 26.5  MCHC 31.0  RDW 14.9  LYMPHSABS 2.6  MONOABS 0.9  EOSABS 0.2  BASOSABS 0.0   ------------------------------------------------------------------------------------------------------------------  Chemistries   Recent Labs Lab 11/20/16 2216  NA 141  K 4.2  CL 105  CO2 30  GLUCOSE 112*  BUN 12  CREATININE 0.89  CALCIUM 8.8*  AST 18  ALT 13*  ALKPHOS 98  BILITOT 0.7   ------------------------------------------------------------------------------------------------------------------ estimated creatinine clearance is 92.2 mL/min (by C-G formula based on SCr of 0.89 mg/dL). ------------------------------------------------------------------------------------------------------------------ No results for input(s): TSH, T4TOTAL, T3FREE, THYROIDAB in the last 72 hours.  Invalid input(s): FREET3  Coagulation profile No results for input(s): INR, PROTIME in the last 168 hours. ------------------------------------------------------------------------------------------------------------------- No results for input(s): DDIMER in the last 72 hours. -------------------------------------------------------------------------------------------------------------------  Cardiac Enzymes No results for input(s): CKMB, TROPONINI, MYOGLOBIN in the last 168 hours.  Invalid input(s): CK ------------------------------------------------------------------------------------------------------------------    Component Value Date/Time   BNP 28.4 07/31/2014 1927     ---------------------------------------------------------------------------------------------------------------  Urinalysis    Component Value  Date/Time   COLORURINE YELLOW 11/20/2016 2320   APPEARANCEUR CLEAR 11/20/2016 2320   LABSPEC 1.012 11/20/2016 2320   PHURINE 8.0 11/20/2016 2320   GLUCOSEU NEGATIVE 11/20/2016 2320   HGBUR NEGATIVE 11/20/2016 2320   HGBUR negative 12/11/2009 1356   BILIRUBINUR NEGATIVE 11/20/2016 2320   BILIRUBINUR MODERATE 06/05/2015 1407   KETONESUR NEGATIVE 11/20/2016 2320   PROTEINUR NEGATIVE 11/20/2016 2320   UROBILINOGEN 2.0 06/05/2015 1407   UROBILINOGEN 1.0 12/30/2014 1830   NITRITE NEGATIVE 11/20/2016 2320   LEUKOCYTESUR NEGATIVE 11/20/2016 2320    ----------------------------------------------------------------------------------------------------------------   Imaging Results:    Dg Chest 1 View  Result Date: 11/20/2016 CLINICAL DATA:  Altered mental status and cough. Multiple falls over the past few weeks including today. EXAM: CHEST 1 VIEW COMPARISON:  07/30/2016 FINDINGS: Shallow inspiration with linear atelectasis in the lung bases. Heart size and pulmonary vascularity are normal for technique. No consolidation or airspace disease. No blunting of costophrenic angles. Old right rib fractures. Calcification of the aorta. IMPRESSION: Atelectasis in the lung bases. Aortic atherosclerosis. No evidence of active pulmonary disease. Electronically Signed   By: Lucienne Capers M.D.   On: 11/20/2016 22:49   Dg Wrist Complete Right  Result Date: 11/20/2016 CLINICAL DATA:  Pain post fall, multiple falls over the past few weeks, chronic wrist pain since a fracture in September 2017 EXAM: RIGHT WRIST - COMPLETE 3+ VIEW COMPARISON:  11/16/2015 FINDINGS: Osseous demineralization. Marked deformity of the distal RIGHT radius secondary to interval healing of previously identified comminuted displaced intra-articular distal  radial metaphyseal fracture. Acquired ulnar plus variance. Ununited fracture fragments at distal ulna and ulnar styloid process. Radiocarpal joint space narrowing. Widening of the  scapholunate interval consistent with torn scapholunate ligament. No acute fracture, dislocation, or bone destruction. IMPRESSION: Significant deformity of the distal radius and to a lesser degree ulna from previously identified fractures. Acquired ulnar plus variance. Torn scapholunate ligament. Mild radiocarpal degenerative changes and diffuse osseous demineralization. Electronically Signed   By: Lavonia Dana M.D.   On: 11/20/2016 23:53   Ct Head Wo Contrast  Result Date: 11/20/2016 CLINICAL DATA:  Patient fell several times over the past several weeks. Ataxia. EXAM: CT HEAD WITHOUT CONTRAST TECHNIQUE: Contiguous axial images were obtained from the base of the skull through the vertex without intravenous contrast. COMPARISON:  11/14/2015 CT. FINDINGS: BRAIN: There is mild sulcal and ventricular prominence consistent with superficial and central atrophy. No intraparenchymal hemorrhage, mass effect nor midline shift. Periventricular and subcortical white matter hypodensities consistent with chronic small vessel ischemic disease are identified. No acute large vascular territory infarcts. No abnormal extra-axial fluid collections. Basal cisterns are not effaced and midline. VASCULAR: Moderate calcific atherosclerosis of the carotid siphons. SKULL: No skull fracture. No significant scalp soft tissue swelling. SINUSES/ORBITS: The mastoid air-cells are clear. The included paranasal sinuses are well-aerated.The included ocular globes and orbital contents are non-suspicious. Status post left cataract extraction. OTHER: None. IMPRESSION: Stable cerebral atrophy with chronic small vessel ischemia. No acute intracranial abnormality. Electronically Signed   By: [name] Royalty M.D.   On: 11/20/2016 23:24   Ct Lumbar Spine Wo Contrast  Result Date: 11/20/2016 CLINICAL DATA:  Fall with back pain EXAM: CT LUMBAR SPINE WITHOUT CONTRAST TECHNIQUE: Multidetector CT imaging of the lumbar spine was performed without intravenous  contrast administration. Multiplanar CT image reconstructions were also generated. COMPARISON:  Lumbar spine CT 03/30/2016 FINDINGS: Segmentation: 5 lumbar type vertebrae. Alignment: Grade 1 L5-S1 anterolisthesis secondary to severe facet arthrosis and left pars interarticularis defect, unchanged. Vertebrae: No acute fracture or focal pathologic process. Paraspinal and other soft tissues: There is atherosclerotic calcification of the non aneurysmal abdominal aorta. Rectosigmoid diverticulosis. Disc levels: There is no bony spinal canal stenosis. There is severe right and moderate left neural foraminal stenosis at L2-L3 due to partially calcified disc bulge and endplate osteophyte formation. There is severe bilateral L5-S1 foraminal stenosis, unchanged. IMPRESSION: 1. No acute fracture of the lumbar spine. 2. Unchanged grade 1 L5-S1 anterolisthesis with severe bilateral neural foraminal stenosis. 3. Severe right and moderate left L2-L3 neural foraminal stenosis. Electronically Signed   By: Ulyses Jarred M.D.   On: 11/20/2016 23:30   Ct Pelvis Wo Contrast  Result Date: 11/20/2016 CLINICAL DATA:  Patient has sustained multiple falls for the past few weeks with pelvic trauma. Decreased mobility. Question fracture. EXAM: CT PELVIS WITHOUT CONTRAST TECHNIQUE: Multidetector CT imaging of the pelvis was performed following the standard protocol without intravenous contrast. COMPARISON:  03/30/2016 CT FINDINGS: Urinary Tract:  No abnormality visualized. Bowel: Sigmoid and descending colonic diverticulosis without acute diverticulitis. Vascular/Lymphatic: Moderate aortoiliac atherosclerosis without aneurysm. Reproductive:  No mass or other significant abnormality Other:  No free air free fluid. Musculoskeletal: Degenerative disc disease L4-5 and to a greater extent L5-S1 with vacuum disc phenomenon. Grade 1 anterolisthesis of L5 on S1. No pars defects. Intact sacrum and sacroiliac joints. No acute pelvic fracture.  Subcortical geodes of the right femoral head-neck junction, stable in appearance. No pathologic fractures. IMPRESSION: 1. No acute osseous abnormality of the bony pelvis. 2. Lower lumbar  degenerative disc disease and spondylosis with grade 1 spondylolisthesis of L5 on S1. Electronically Signed   By: [name] Royalty M.D.   On: 11/20/2016 23:29   Dg Knee Complete 4 Views Left  Result Date: 11/20/2016 CLINICAL DATA:  Altered mental status and cough.  Patient fell. EXAM: LEFT KNEE - COMPLETE 4+ VIEW COMPARISON:  None. FINDINGS: No evidence of fracture, dislocation, or joint effusion. No evidence of arthropathy or other focal bone abnormality. Vascular calcifications. IMPRESSION: No acute bony abnormalities. Electronically Signed   By: Lucienne Capers M.D.   On: 11/20/2016 22:50       Assessment & Plan:    Active Problems:   COPD exacerbation ([place])    Hypoxia ? Poor wave form, ? Undiagnosed OSA,  ?mild Copd exacerbation Check ABG this am zithromax Prednisone 60mg  po qday, can start taper in am Continue Duoneb  R wrist pain ED consulted orthopedics (per ED), appreciate input  Pafib Cont amiodarone, cont aspirin Unclear why not on anticoagulation but probably due to fall risk  Bipolar do Cont depakote, cont risperdal, Cont xanax  Seizure do Cont keppra  Hypertension Cont amlodipine  Hyperlipidemia Cont Liptior  Dm2 Check hga1c fsbs ac and qhs, ISS     DVT Prophylaxis Lovenox - SCDs  AM Labs Ordered, also please review Full Orders  Family Communication: Admission, patients condition and plan of care including tests being ordered have been discussed with the patient  who indicate understanding and agree with the plan and Code Status.  Code Status FULL CODE  Likely DC to  home  Condition GUARDED    Consults called: orthopedic per ED  Admission status: observation  Time spent in minutes : 45   Jani Gravel M.D on 11/21/2016 at 12:53 AM  Between 7am to 7pm - Pager -  [phone] . After 7pm go to [email or web address] - password [facility]  [facility] - Office  [phone]

## 2016-11-21 NOTE — Evaluation (Signed)
Physical Therapy Evaluation Patient Details Name: Patrick Holt MRN: [id number] DOB: 07/28/1950 Today's Date: 11/21/2016   History of Present Illness     Patrick Holt  is a 66 y.o. male, w rheumatoid arthritis, sarcoid, dermatomyositis, Pafib, seizure, dm2, diabetic neuropathy, apparently has has recurrent falls at home and complains of right wrist pain.  Hypoxic at 83%. Plain film = negative for acute fx, but significant deformity of distal radius and ulna from previous fx; acquired ulnar plus variance; torn scapholunate ligament; mild radiocarpal degen changes.   Clinical Impression  Pt presents with severe limitations to functional mobility in setting of significant socioeconomic barriers to quality and effective medical care.  Pt unable to move in bed without physical help and has limited ambulatory range using assistive device.  Pt has complicated medical history including multiple falls at home, some injurious.  He is surely at high risk for falls in future and likely more injury or potential catastrophe.  Discussed with pt d/c options, which distresses pt due to limited financial resources.  Feel the safest option for this patient is a postacute rehab facility for continued therapy interventions to improve ability to function independently.  I am concerned as pt is OBSERVATION status and likely will not have Medicare coverage for this plan; should pt refuse SNF placement, please ask for HHPT to follow up with him with maximal support.  PT will initiate care in acute setting and strongly recommend delaying d/c until a safe d/c plan is securely in place.    Follow Up Recommendations SNF    Equipment Recommendations    none at this time (as RW, cane, scooter)   Recommendations for Other Services       Precautions / Restrictions Precautions Precautions: Fall Restrictions Weight Bearing Restrictions: No      Mobility  Bed Mobility Overal bed mobility: Needs Assistance Bed  Mobility: Rolling;Supine to Sit;Sit to Supine Rolling: Max assist   Supine to sit: Mod assist;HOB elevated Sit to supine: Mod assist   General bed mobility comments: struggles with simple repositioning and unable to adjust without significant help  Transfers Overall transfer level: Needs assistance Equipment used: Rolling walker (2 wheeled) Transfers: Sit to/from Stand Sit to Stand: Min assist         General transfer comment: once at EOB able to stand with little physical help, but cannot assume upright posture and leans over RW for duration of standing (about 2 minutes  Ambulation/Gait Ambulation/Gait assistance:  (not attempted)           General Gait Details: pt able to shift weight and take 1-2 steps to front or side with RW.  struggles  Stairs            Wheelchair Mobility    Modified Rankin (Stroke Patients Only)       Balance Overall balance assessment: History of Falls                                           Pertinent Vitals/Pain Pain Assessment: Faces Faces Pain Scale: Hurts little more Pain Location: left knee; generalized Pain Intervention(s): Limited activity within patient's tolerance;Monitored during session;Repositioned    Home Living Family/patient expects to be discharged to:: Private residence Living Arrangements: Alone Available Help at Discharge: Other (Comment) ([employer] and whomever can take him to store) Type of Home: [place] Access: Elevator  Home Layout: One level Home Equipment: Walker - 2 wheels;Cane - single point;Wheelchair - power Additional Comments: no family, no friends, no money    Prior Function Level of Independence: Independent with assistive device(s)         Comments: uses cane for short distances, uses scooter when able but not in stores b/c [employer] won't wait and scooter won't fit into a car     Hand Dominance        Extremity/Trunk Assessment   Upper Extremity  Assessment Upper Extremity Assessment: Defer to OT evaluation;Generalized weakness    Lower Extremity Assessment Lower Extremity Assessment: Generalized weakness;LLE deficits/detail LLE Deficits / Details: pain, protective bandage in place, hx OA per pt report;    Cervical / Trunk Assessment Cervical / Trunk Assessment: Normal  Communication   Communication: No difficulties  Cognition Arousal/Alertness: Awake/alert Behavior During Therapy: WFL for tasks assessed/performed Overall Cognitive Status: Within Functional Limits for tasks assessed                                 General Comments: pt has slow speech pattern but seems to have accurate historical recall though admits to some memory difficulties likely age related?      General Comments General comments (skin integrity, edema, etc.): pt's right wrist in soft wrap, protective padding bandage to left knee; EXTENSIVE conversation about socioeconomic problems and limitations    Exercises     Assessment/Plan    PT Assessment Patient needs continued PT services  PT Problem List Obesity;Cardiopulmonary status limiting activity;Decreased mobility;Decreased balance;Decreased strength;Decreased range of motion;Decreased activity tolerance       PT Treatment Interventions Therapeutic exercise;Therapeutic activities;Functional mobility training;Gait training;DME instruction;Patient/family education    PT Goals (Current goals can be found in the Care Plan section)  Acute Rehab PT Goals Patient Stated Goal: go home PT Goal Formulation: With patient Time For Goal Achievement: 12/05/16 Potential to Achieve Goals: Fair    Frequency Min 3X/week   Barriers to discharge Decreased caregiver support no one to help; lives in apt building and uses [employer] for MD appts, gets driver for store; under poverty level, says previously denied Medicaid    Co-evaluation               AM-PAC PT "6 Clicks" Daily Activity   Outcome Measure Difficulty turning over in bed (including adjusting bedclothes, sheets and blankets)?: A Lot Difficulty moving from lying on back to sitting on the side of the bed? : A Lot Difficulty sitting down on and standing up from a chair with arms (e.g., wheelchair, bedside commode, etc,.)?: A Little Help needed moving to and from a bed to chair (including a wheelchair)?: A Little Help needed walking in hospital room?: A Little Help needed climbing 3-5 steps with a railing? : Total 6 Click Score: 14    End of Session Equipment Utilized During Treatment: Gait belt Activity Tolerance: Patient limited by fatigue Patient left: in bed;with call bell/phone within reach Nurse Communication: Mobility status PT Visit Diagnosis: Repeated falls (R29.6);Muscle weakness (generalized) (M62.81);History of falling (Z91.81);Unsteadiness on feet (R26.81)    Time: 9326-7124 PT Time Calculation (min) (ACUTE ONLY): 42 min   Charges:   PT Evaluation $PT Eval High Complexity: 1 High PT Treatments $Therapeutic Activity: 23-37 mins   PT G Codes:        Kearney Hard, PT, DPT, MS Board Certified Geriatric Physical Therapist  Herbie Drape 11/21/2016, 11:18 AM

## 2016-11-21 NOTE — Progress Notes (Signed)
Pt admitted from ED per stretcher accompanied by a nurse tech on arrival to the floor pt fully alert and oriented self introduced to pt ID bracelet varified with pt, vital signs are stable, oriented to room and pt care equipment, pt has abrasion at the rt kneel, bruise rt arm and lt buttocks from his frequent falls at home, pt on low bed, kept on bed alarm high fall risk bracelet on, on call physician paged to put diet  Order, prescribed meds given will continue to monitor pt

## 2016-11-21 NOTE — Progress Notes (Signed)
[facility]   Patient admitted after midnight. See H&P for further details.   Patient was admitted for hypoxia, patient reported having frequents fall. Had a right wrist fracture about a  year ago which was treated without surgical intervention. Patient reported he stands and now with swelling and tender, x-ray does not show acute fracture but does show severe deformity of the distal radius. Patient was placed on splint. Patient is with working diagnosis of mild COPD exacerbation.   Still requiring some O2, and mild wheezing. Big anxiety component contributing   A/P  COPD mild exacerbation  Get procalcitonin  Agree with azithro Will add Brovana and Pulmicort Wean O2 as tolerated - not on O2 at home  Continue duonebs  Severe arthritic of R wrist - from previous fractures  Case discussed with Dr Sharol Given - recommended splint and follow up as outpatient in 2-3 weeks  Continue pain management as needed   PAF  Patient on amio - HR well controlled  Not on a/c unclear why may due to hx of falls  Check daily EKG for QTc as patient is on multiple QTc prolonging agents   Rest per H&P  Chipper Oman, MD

## 2016-11-22 DIAGNOSIS — J441 Chronic obstructive pulmonary disease with (acute) exacerbation: Secondary | ICD-10-CM | POA: Diagnosis not present

## 2016-11-22 LAB — GLUCOSE, CAPILLARY
Glucose-Capillary: 79 mg/dL (ref 65–99)
Glucose-Capillary: 111 mg/dL — ABNORMAL HIGH (ref 65–99)

## 2016-11-22 MED ORDER — PREDNISONE 10 MG PO TABS: ORAL_TABLET | ORAL | 0 refills | Status: DC

## 2016-11-22 MED ORDER — AZITHROMYCIN 500 MG PO TABS: 500.0000 mg | ORAL_TABLET | Freq: Once | ORAL | 0 refills | Status: AC

## 2016-11-22 MED ORDER — LEVETIRACETAM 750 MG PO TABS: 750.0000 mg | ORAL_TABLET | Freq: Two times a day (BID) | ORAL | 0 refills | Status: DC

## 2016-11-22 MED ORDER — BUDESONIDE-FORMOTEROL FUMARATE 160-4.5 MCG/ACT IN AERO: 2.0000 | INHALATION_SPRAY | Freq: Two times a day (BID) | RESPIRATORY_TRACT | 12 refills | Status: DC

## 2016-11-22 MED ORDER — AZITHROMYCIN 500 MG PO TABS: 500.0000 mg | ORAL_TABLET | Freq: Once | ORAL | Status: DC

## 2016-11-22 NOTE — Progress Notes (Signed)
Pt was weaned off from oxygen the whole of the shift, doing well with room air

## 2016-11-22 NOTE — Care Management Obs Status (Signed)
MEDICARE OBSERVATION STATUS NOTIFICATION   Patient Details  Name: Patrick Holt MRN: [id number] Date of Birth: [birth year]   Medicare Observation Status Notification Given:  Yes    Sharin Mons, RN 11/22/2016, 3:20 PM

## 2016-11-22 NOTE — Discharge Summary (Signed)
Physician Discharge Summary  Patrick Holt  IRW:[id number]  DOB: 1950/05/12  DOA: 11/20/2016 PCP: [facility]  Admit date: 11/20/2016 Discharge date: 11/22/2016  Admitted From: Home  Disposition: Home   Recommendations for Outpatient Follow-up:  1. Follow up with PCP in 1 weeks 2. Patient need to schedule an appointment with Dr. Sharol Given 3. Patient may need to follow with behavioral health as outpatient   Home Health: PT/OT RN, Aide   Discharge Condition: Stable  CODE STATUS: Full  Diet recommendation: Heart Healthy / Carb Modified / Regular / Dysphagia   Brief/Interim Summary: For full details see H&P and progress note but in brief, 66 y/o M with RA, sarcoid, dermatomyosstiis, PAF, seizure, DM2 and neuropathy. He presented to ED c/o wrist pain and frequent falls at home. Patient was found to be hypoxic to 83% on RA though to be due to mild COPD exacerbation. Patient was admitted for hypoxia and further management.   Subjective: Patient seen and examined, patient does not want to see me because we are not doing surgery. Patient insisting that he was told that he is going to have surgery in the ED. No other concerns. Afebrile. Denies chest pain and SOB.   Discharge Diagnoses/Hospital Course:  COPD mild exacerbation  Found to be hypoxic in the ED - patient stated that he was never SOB.  Will complete azithromycin, hypoxia could explain fall. He report compliance with his inhalers, will add Symbicort daily. 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  Case discussed with Dr Sharol Given - recommended splint if there is pain or swelling and follow up as outpatient in 2-3 weeks. Patient removed the splint and he  doesn't want to wear it.  PAF  Patient on amio - HR well controlled  Not on a/c unclear why may due to hx of falls    Seizure d/o  Keppra is not at therapeutic levels - ? If this is seizure ar causing frequent fall. Patient report no recent seizures. Unclear if he is taking his medication, he assure that he is taking his medications. Given that will increase Keppra to 750 mg BID and follow up with PCP   Patient upset because he was told in the ED that he have a new fracture, case with discussed with Dr. Sharol Given, there is no new fractures identified. Patient has old fractures creating severe arthritis that can cause intermittent pain and swelling. Discussed patient findings and he want surgery. No surgical recommendation at this point is needed and he was advised to follow up with Dr. Sharol Given as outpatient. PT evaluated patient given frequent fall and recommended SNF but because he is not having surgery he is refusing going to rehab. He want to go home, offered [facility] PT which he accepted. Patient frequent fall are due to deconditioning, patient uses a scooter at home and has no supervision, but he is refusing to go to SNF. Patient is alert and oriented and capable to make his own decision. Patient will be d/c home with [facility] PT.   All other chronic medical condition were stable during the hospitalization.  Patient was seen by physical therapy, recommending SNF but patient declining.  On the day of  the discharge the patient's vitals were stable, and no other acute medical condition were reported by patient. Patient was felt safe to be discharge to home.   Discharge Instructions  You were cared for by a hospitalist during your hospital stay. If you have any questions about your discharge medications or the care you received while you were in the hospital after you are discharged, you can call the unit and asked to speak with the hospitalist on call if the hospitalist that took care of you is not available. Once  you are discharged, your primary care physician will handle any further medical issues. Please note that NO REFILLS for any discharge medications will be authorized once you are discharged, as it is imperative that you return to your primary care physician (or establish a relationship with a primary care physician if you do not have one) for your aftercare needs so that they can reassess your need for medications and monitor your lab values.  Discharge Instructions    Call MD for:  difficulty breathing, headache or visual disturbances    Complete by:  As directed    Call MD for:  extreme fatigue    Complete by:  As directed    Call MD for:  hives    Complete by:  As directed    Call MD for:  persistant dizziness or light-headedness    Complete by:  As directed    Call MD for:  persistant nausea and vomiting    Complete by:  As directed    Call MD for:  redness, tenderness, or signs of infection (pain, swelling, redness, odor or green/yellow discharge around incision site)    Complete by:  As directed    Call MD for:  severe uncontrolled pain    Complete by:  As directed    Call MD for:  temperature >100.4    Complete by:  As directed    Diet - low sodium heart healthy    Complete by:  As directed    Increase activity slowly    Complete by:  As directed      Allergies as of 11/22/2016      Reactions   Immune Globulins Other (See Comments)   Acute renal failure   Ciprofloxacin Swelling   Flagyl [metronidazole] Swelling   Lisinopril Diarrhea   Sulfa Antibiotics Other (See Comments)   blisters   Requip [ropinirole Hcl]    constipation   Betamethasone Dipropionate Other (See Comments)   Unknown   Bupropion Hcl Other (See Comments)   Unknown   Clobetasol Other (See Comments)   Unknown   Codeine Other (See Comments)   Unknown   Escitalopram Oxalate Other (See Comments)   Unknown   Fluoxetine Hcl Other (See Comments)   Unknown   Furosemide Other (See Comments)   Unknown    Meclizine Rash   Paroxetine Other (See Comments)   Unknown   Penicillins Other (See Comments)   Unknown reaction Has patient had a PCN reaction causing immediate rash, facial/tongue/throat swelling, SOB or lightheadedness with hypotension: unknown Has patient had a PCN reaction causing severe rash involving mucus membranes or skin necrosis: unknown Has patient had a PCN reaction that required hospitalization unknown Has patient had a PCN reaction occurring within the last 10 years: unknown If all of the above answers are "NO", then may proceed with Cephalosporin use. Unknown Unknown reaction Has patient had a PCN reaction causing immediate rash, facial/tongue/throat swelling, SOB or lightheadedness with hypotension: unknown Has  patient had a PCN reaction causing severe rash involving mucus membranes or skin necrosis: unknown Has patient had a PCN reaction that required hospitalization unknown Has patient had a PCN reaction occurring within the last 10 years: unknown If all of the above answers are "NO", then may proceed with Cephalosporin use.   Tacrolimus Other (See Comments)   Unknown   Tetanus Toxoid Other (See Comments)   Unknown   Tuberculin Purified Protein Derivative Other (See Comments)   Unknown      Medication List    TAKE these medications   ALPRAZolam 1 MG tablet Commonly known as:  XANAX Take 1 mg by mouth 3 (three) times daily.   amiodarone 200 MG tablet Commonly known as:  PACERONE Take 0.5 tablets (100 mg total) by mouth daily.   amLODipine 5 MG tablet Commonly known as:  NORVASC Take 1 tablet (5 mg total) by mouth daily.   aspirin EC 81 MG tablet Take 1 tablet (81 mg total) by mouth daily.   atorvastatin 40 MG tablet Commonly known as:  LIPITOR Take 1 tablet (40 mg total) by mouth daily. What changed:  when to take this   azithromycin 500 MG tablet Commonly known as:  ZITHROMAX Take 1 tablet (500 mg total) by mouth once.   budesonide-formoterol  160-4.5 MCG/ACT inhaler Commonly known as:  SYMBICORT Inhale 2 puffs into the lungs 2 (two) times daily.   divalproex 250 MG 24 hr tablet Commonly known as:  DEPAKOTE ER Take 3 tablets (750 mg total) by mouth daily.   esomeprazole 40 MG capsule Commonly known as:  NEXIUM Take 40 mg by mouth daily.   ipratropium-albuterol 0.5-2.5 (3) MG/3ML Soln Commonly known as:  DUONEB Inhale 3 mLs into the lungs every 6 (six) hours as needed. What changed:  reasons to take this   levETIRAcetam 750 MG tablet Commonly known as:  KEPPRA Take 1 tablet (750 mg total) by mouth 2 (two) times daily. TAKE 1 TABLET(500 MG) BY MOUTH TWICE DAILY What changed:  medication strength  how much to take  how to take this  when to take this   loperamide 2 MG capsule Commonly known as:  IMODIUM TAKE 1 TO 2 CAPSULES BY MOUTH 3 TIMES A DAY AS NEEDED FOR DIARRHEA What changed:  how much to take  how to take this  when to take this  reasons to take this  additional instructions   multivitamin with minerals Tabs tablet Take 1 tablet by mouth daily.   oxybutynin 5 MG tablet Commonly known as:  DITROPAN Take 5 mg by mouth daily.   Potassium Chloride ER 20 MEQ Tbcr Take 1 tablet by mouth every other day.   predniSONE 10 MG tablet Commonly known as:  DELTASONE Take 4 tablets for 3 days; Take 3 tablets for 4 days; Take 2 tablets for 3 days; Take 1 tablet for 4 days   risperiDONE 1 MG tablet Commonly known as:  RISPERDAL Take 1 tablet (1 mg total) by mouth 2 (two) times daily.      Follow-up Information    [facility]. Schedule an appointment as soon as possible for a visit in 1 week(s).   Why:  Hospital follow up  Contact information: [address] [place] [address] [phone]        Newt Minion, MD. Schedule an appointment as soon as possible for a visit in 3 week(s).   Specialty:  Orthopedic Surgery Contact information: [address]  [place] [address] [phone]          Allergies  Allergen Reactions  . Immune Globulins Other (See Comments)    Acute renal failure  . Ciprofloxacin Swelling  . Flagyl [Metronidazole] Swelling  . Lisinopril Diarrhea  . Sulfa Antibiotics Other (See Comments)    blisters  . Requip [Ropinirole Hcl]     constipation  . Betamethasone Dipropionate Other (See Comments)    Unknown  . Bupropion Hcl Other (See Comments)    Unknown  . Clobetasol Other (See Comments)    Unknown  . Codeine Other (See Comments)    Unknown  . Escitalopram Oxalate Other (See Comments)    Unknown  . Fluoxetine Hcl Other (See Comments)    Unknown  . Furosemide Other (See Comments)    Unknown  . Meclizine Rash  . Paroxetine Other (See Comments)    Unknown  . Penicillins Other (See Comments)    Unknown reaction Has patient had a PCN reaction causing immediate rash, facial/tongue/throat swelling, SOB or lightheadedness with hypotension: unknown Has patient had a PCN reaction causing severe rash involving mucus membranes or skin necrosis: unknown Has patient had a PCN reaction that required hospitalization unknown Has patient had a PCN reaction occurring within the last 10 years: unknown If all of the above answers are "NO", then may proceed with Cephalosporin use. Unknown Unknown reaction Has patient had a PCN reaction causing immediate rash, facial/tongue/throat swelling, SOB or lightheadedness with hypotension: unknown Has patient had a PCN reaction causing severe rash involving mucus membranes or skin necrosis: unknown Has patient had a PCN reaction that required hospitalization unknown Has patient had a PCN reaction occurring within the last 10 years: unknown If all of the above answers are "NO", then may proceed with Cephalosporin use.   . Tacrolimus Other (See Comments)    Unknown  . Tetanus Toxoid Other (See Comments)    Unknown  . Tuberculin Purified Protein Derivative Other (See  Comments)    Unknown    Consultations:  None   Procedures/Studies: Dg Chest 1 View  Result Date: 11/20/2016 CLINICAL DATA:  Altered mental status and cough. Multiple falls over the past few weeks including today. EXAM: CHEST 1 VIEW COMPARISON:  07/30/2016 FINDINGS: Shallow inspiration with linear atelectasis in the lung bases. Heart size and pulmonary vascularity are normal for technique. No consolidation or airspace disease. No blunting of costophrenic angles. Old right rib fractures. Calcification of the aorta. IMPRESSION: Atelectasis in the lung bases. Aortic atherosclerosis. No evidence of active pulmonary disease. Electronically Signed   By: Lucienne Capers M.D.   On: 11/20/2016 22:49   Dg Wrist Complete Right  Result Date: 11/20/2016 CLINICAL DATA:  Pain post fall, multiple falls over the past few weeks, chronic wrist pain since a fracture in September 2017 EXAM: RIGHT WRIST - COMPLETE 3+ VIEW COMPARISON:  11/16/2015 FINDINGS: Osseous demineralization. Marked deformity of the distal RIGHT radius secondary to interval healing of previously identified comminuted displaced intra-articular distal radial metaphyseal fracture. Acquired ulnar plus variance. Ununited fracture fragments at distal ulna and ulnar styloid process. Radiocarpal joint space narrowing. Widening of the scapholunate interval consistent with torn scapholunate ligament. No acute fracture, dislocation, or bone destruction. IMPRESSION: Significant deformity of the distal radius and to a lesser degree ulna from previously identified fractures. Acquired ulnar plus variance. Torn scapholunate ligament. Mild radiocarpal degenerative changes and diffuse osseous demineralization. Electronically Signed   By: Lavonia Dana M.D.   On: 11/20/2016 23:53   Ct Head  Wo Contrast  Result Date: 11/20/2016 CLINICAL DATA:  Patient fell several times over the past several weeks. Ataxia. EXAM: CT HEAD WITHOUT CONTRAST TECHNIQUE: Contiguous axial  images were obtained from the base of the skull through the vertex without intravenous contrast. COMPARISON:  11/14/2015 CT. FINDINGS: BRAIN: There is mild sulcal and ventricular prominence consistent with superficial and central atrophy. No intraparenchymal hemorrhage, mass effect nor midline shift. Periventricular and subcortical white matter hypodensities consistent with chronic small vessel ischemic disease are identified. No acute large vascular territory infarcts. No abnormal extra-axial fluid collections. Basal cisterns are not effaced and midline. VASCULAR: Moderate calcific atherosclerosis of the carotid siphons. SKULL: No skull fracture. No significant scalp soft tissue swelling. SINUSES/ORBITS: The mastoid air-cells are clear. The included paranasal sinuses are well-aerated.The included ocular globes and orbital contents are non-suspicious. Status post left cataract extraction. OTHER: None. IMPRESSION: Stable cerebral atrophy with chronic small vessel ischemia. No acute intracranial abnormality. Electronically Signed   By: [name] Royalty M.D.   On: 11/20/2016 23:24   Ct Lumbar Spine Wo Contrast  Result Date: 11/20/2016 CLINICAL DATA:  Fall with back pain EXAM: CT LUMBAR SPINE WITHOUT CONTRAST TECHNIQUE: Multidetector CT imaging of the lumbar spine was performed without intravenous contrast administration. Multiplanar CT image reconstructions were also generated. COMPARISON:  Lumbar spine CT 03/30/2016 FINDINGS: Segmentation: 5 lumbar type vertebrae. Alignment: Grade 1 L5-S1 anterolisthesis secondary to severe facet arthrosis and left pars interarticularis defect, unchanged. Vertebrae: No acute fracture or focal pathologic process. Paraspinal and other soft tissues: There is atherosclerotic calcification of the non aneurysmal abdominal aorta. Rectosigmoid diverticulosis. Disc levels: There is no bony spinal canal stenosis. There is severe right and moderate left neural foraminal stenosis at L2-L3 due to  partially calcified disc bulge and endplate osteophyte formation. There is severe bilateral L5-S1 foraminal stenosis, unchanged. IMPRESSION: 1. No acute fracture of the lumbar spine. 2. Unchanged grade 1 L5-S1 anterolisthesis with severe bilateral neural foraminal stenosis. 3. Severe right and moderate left L2-L3 neural foraminal stenosis. Electronically Signed   By: Ulyses Jarred M.D.   On: 11/20/2016 23:30   Ct Pelvis Wo Contrast  Result Date: 11/20/2016 CLINICAL DATA:  Patient has sustained multiple falls for the past few weeks with pelvic trauma. Decreased mobility. Question fracture. EXAM: CT PELVIS WITHOUT CONTRAST TECHNIQUE: Multidetector CT imaging of the pelvis was performed following the standard protocol without intravenous contrast. COMPARISON:  03/30/2016 CT FINDINGS: Urinary Tract:  No abnormality visualized. Bowel: Sigmoid and descending colonic diverticulosis without acute diverticulitis. Vascular/Lymphatic: Moderate aortoiliac atherosclerosis without aneurysm. Reproductive:  No mass or other significant abnormality Other:  No free air free fluid. Musculoskeletal: Degenerative disc disease L4-5 and to a greater extent L5-S1 with vacuum disc phenomenon. Grade 1 anterolisthesis of L5 on S1. No pars defects. Intact sacrum and sacroiliac joints. No acute pelvic fracture. Subcortical geodes of the right femoral head-neck junction, stable in appearance. No pathologic fractures. IMPRESSION: 1. No acute osseous abnormality of the bony pelvis. 2. Lower lumbar degenerative disc disease and spondylosis with grade 1 spondylolisthesis of L5 on S1. Electronically Signed   By: [name] Royalty M.D.   On: 11/20/2016 23:29   Dg Knee Complete 4 Views Left  Result Date: 11/20/2016 CLINICAL DATA:  Altered mental status and cough.  Patient fell. EXAM: LEFT KNEE - COMPLETE 4+ VIEW COMPARISON:  None. FINDINGS: No evidence of fracture, dislocation, or joint effusion. No evidence of arthropathy or other focal bone  abnormality. Vascular calcifications. IMPRESSION: No acute bony abnormalities. Electronically Signed  By: Lucienne Capers M.D.   On: 11/20/2016 22:50    Discharge Exam: Vitals:   11/21/16 2201 11/22/16 0527  BP: 128/71 125/74  Pulse: 75 76  Resp: 18 18  Temp: 98.6 F (37 C) 97.9 F (36.6 C)  SpO2: 94% 93%   Vitals:   11/21/16 1644 11/21/16 2100 11/21/16 2201 11/22/16 0527  BP: 106/61  128/71 125/74  Pulse: 79 80 75 76  Resp: 20 18 18 18   Temp:   98.6 F (37 C) 97.9 F (36.6 C)  TempSrc:      SpO2: 93% 93% 94% 93%  Weight:    103.4 kg (228 lb)  Height:        General: Upset  Cardiovascular: RRR, S1/S2 +, no rubs, no gallops Respiratory: CTA bilaterally, no wheezing, no rhonchi Abdominal: Soft, NT, ND, bowel sounds + Extremities: ROM intact. No swelling. No edema    The results of significant diagnostics from this hospitalization (including imaging, microbiology, ancillary and laboratory) are listed below for reference.     Microbiology: No results found for this or any previous visit (from the past 240 hour(s)).   Labs: BNP (last 3 results) No results for input(s): BNP in the last 8760 hours. Basic Metabolic Panel:  Recent Labs Lab 11/20/16 2216 11/21/16 0415  NA 141 139  K 4.2 3.6  CL 105 105  CO2 30 26  GLUCOSE 112* 189*  BUN 12 10  CREATININE 0.89 0.96  CALCIUM 8.8* 8.1*   Liver Function Tests:  Recent Labs Lab 11/20/16 2216 11/21/16 0415  AST 18 17  ALT 13* 11*  ALKPHOS 98 83  BILITOT 0.7 0.6  PROT 7.3 6.1*  ALBUMIN 3.7 3.1*   No results for input(s): LIPASE, AMYLASE in the last 168 hours. No results for input(s): AMMONIA in the last 168 hours. CBC:  Recent Labs Lab 11/20/16 2216 11/21/16 0415  WBC 11.8* 9.5  NEUTROABS 8.2*  --   HGB 14.2 12.2*  HCT 45.8 40.4  MCV 85.6 85.8  PLT 254 244   Cardiac Enzymes: No results for input(s): CKTOTAL, CKMB, CKMBINDEX, TROPONINI in the last 168 hours. BNP: Invalid input(s):  POCBNP CBG:  Recent Labs Lab 11/21/16 1159 11/21/16 1729 11/21/16 2202 11/22/16 0822 11/22/16 1159  GLUCAP 127* 220* 159* 79 111*   D-Dimer No results for input(s): DDIMER in the last 72 hours. Hgb A1c  Recent Labs  11/21/16 0833  HGBA1C 6.2*   Lipid Profile No results for input(s): CHOL, HDL, LDLCALC, TRIG, CHOLHDL, LDLDIRECT in the last 72 hours. Thyroid function studies No results for input(s): TSH, T4TOTAL, T3FREE, THYROIDAB in the last 72 hours.  Invalid input(s): FREET3 Anemia work up No results for input(s): VITAMINB12, FOLATE, FERRITIN, TIBC, IRON, RETICCTPCT in the last 72 hours. Urinalysis    Component Value Date/Time   COLORURINE YELLOW 11/20/2016 2320   APPEARANCEUR CLEAR 11/20/2016 2320   LABSPEC 1.012 11/20/2016 2320   PHURINE 8.0 11/20/2016 2320   GLUCOSEU NEGATIVE 11/20/2016 2320   HGBUR NEGATIVE 11/20/2016 2320   HGBUR negative 12/11/2009 1356   BILIRUBINUR NEGATIVE 11/20/2016 2320   BILIRUBINUR MODERATE 06/05/2015 1407   KETONESUR NEGATIVE 11/20/2016 2320   PROTEINUR NEGATIVE 11/20/2016 2320   UROBILINOGEN 2.0 06/05/2015 1407   UROBILINOGEN 1.0 12/30/2014 1830   NITRITE NEGATIVE 11/20/2016 2320   LEUKOCYTESUR NEGATIVE 11/20/2016 2320   Sepsis Labs Invalid input(s): PROCALCITONIN,  WBC,  LACTICIDVEN Microbiology No results found for this or any previous visit (from the past 240 hour(s)).   Time  coordinating discharge: 25 minutes  SIGNED:  Chipper Oman, MD  [facility] 11/22/2016, 2:28 PM  Pager please text page via  [email or web address] Password [email or web address]

## 2016-11-22 NOTE — Progress Notes (Signed)
Patient discharge home via EMS. Patient given prescriptions and discharge instructions using teach back. Patient  Stated he will ride his scooter to pharmacy to get meds. Patient belonging packed and room was check.

## 2016-11-22 NOTE — Progress Notes (Signed)
Physical Therapy Treatment Patient Details Name: Patrick Holt MRN: [id number] DOB: 1950-11-19 Today's Date: 11/22/2016    History of Present Illness Patrick Holt  is a 66 y.o. male, w rheumatoid arthritis, sarcoid, dermatomyositis, Pafib, seizure, dm2, diabetic neuropathy. Pt reports he has muscular dystrophy however no mention of such in chart. Apparently has has recurrent falls at home and complains of right wrist pain.  Hypoxic at 83%. Plain film = negative for acute fx, but significant deformity of distal radius and ulna from previous fx; acquired ulnar plus variance; torn scapholunate ligament; mild radiocarpal degen changes.    PT Comments    Pt progressing slowly towards physical therapy goals. Required almost constant hands-on assistance for balance support, walker management, and general safety throughout  OOB mobility. It did not appear that pt was truly aware of his mobility deficits, and how difficult it will be for pt to manage alone at home for basic ADL's. When pointed out that pt was not even able to clean himself after toileting, he continued to deny need for assistance. I do not feel this patient is safe to return home alone at this time. He is at high risk of falls, and requires close assistance for all mobility at this time. Will continue to follow.   Follow Up Recommendations  SNF;Supervision/Assistance - 24 hour     Equipment Recommendations  None recommended by PT    Recommendations for Other Services       Precautions / Restrictions Precautions Precautions: Fall Restrictions Weight Bearing Restrictions: No    Mobility  Bed Mobility Overal bed mobility: Needs Assistance Bed Mobility: Supine to Sit     Supine to sit: Mod assist;HOB elevated     General bed mobility comments: Pt attempting to exit bed when PT arrived. Required heavy mod assist and HOB elevated to successfully achieve EOB.   Transfers Overall transfer level: Needs  assistance Equipment used: Rolling walker (2 wheeled) Transfers: Sit to/from Stand Sit to Stand: Min assist         General transfer comment: VC's for hand placement on seated surface for safety. Pt required assist to power-up to full standing position.   Ambulation/Gait Ambulation/Gait assistance: Mod assist Ambulation Distance (Feet): 25 Feet Assistive device: Rolling walker (2 wheeled) Gait Pattern/deviations: Decreased stride length;Shuffle;Trunk flexed Gait velocity: Decreased Gait velocity interpretation: Below normal speed for age/gender General Gait Details: Pt required mod assist for walker management and balance support while ambulating in room. Focus of session was ambulation to/from the bathroom. Pt required almost constant cues for closer walker proximity as well as staying inside of the walker.   Stairs            Wheelchair Mobility    Modified Rankin (Stroke Patients Only)       Balance Overall balance assessment: History of Falls                                          Cognition Arousal/Alertness: Awake/alert Behavior During Therapy: WFL for tasks assessed/performed Overall Cognitive Status: Within Functional Limits for tasks assessed                                 General Comments: pt has slow speech pattern but seems to have accurate historical recall though admits to some memory difficulties likely age related?  Exercises      General Comments General comments (skin integrity, edema, etc.): pt's right wrist in soft wrap, protective padding bandage to left knee; EXTENSIVE conversation about pt's issues with his healthcare team and limitations      Pertinent Vitals/Pain Pain Assessment: Faces Faces Pain Scale: Hurts a little bit Pain Location: General pain with movement Pain Intervention(s): Limited activity within patient's tolerance;Monitored during session;Repositioned    Home Living Family/patient  expects to be discharged to:: Private residence                    Prior Function            PT Goals (current goals can now be found in the care plan section) Acute Rehab PT Goals Patient Stated Goal: go home PT Goal Formulation: With patient Time For Goal Achievement: 12/05/16 Potential to Achieve Goals: Fair Progress towards PT goals: Progressing toward goals    Frequency    Min 3X/week      PT Plan Current plan remains appropriate    Co-evaluation              AM-PAC PT "6 Clicks" Daily Activity  Outcome Measure  Difficulty turning over in bed (including adjusting bedclothes, sheets and blankets)?: Unable Difficulty moving from lying on back to sitting on the side of the bed? : Unable Difficulty sitting down on and standing up from a chair with arms (e.g., wheelchair, bedside commode, etc,.)?: Unable Help needed moving to and from a bed to chair (including a wheelchair)?: A Lot Help needed walking in hospital room?: A Lot Help needed climbing 3-5 steps with a railing? : Total 6 Click Score: 8    End of Session Equipment Utilized During Treatment: Gait belt Activity Tolerance: Patient limited by fatigue Patient left: in bed;with call bell/phone within reach Nurse Communication: Mobility status PT Visit Diagnosis: Repeated falls (R29.6);Muscle weakness (generalized) (M62.81);History of falling (Z91.81);Unsteadiness on feet (R26.81)     Time: 0938-1829 PT Time Calculation (min) (ACUTE ONLY): 31 min  Charges:  $Therapeutic Activity: 23-37 mins                    G Codes:       Rolinda Roan, PT, DPT Acute Rehabilitation Services Pager: [place] 11/22/2016, 12:01 PM

## 2016-11-22 NOTE — Progress Notes (Signed)
CSW sent in info for pasrr manual review. Patient is unable to discharge to SNF without a pasrr.  Patrick Holt LCSWA [phone]

## 2016-11-22 NOTE — NC FL2 (Signed)
[place] LEVEL OF CARE SCREENING TOOL     IDENTIFICATION  Patient Name: Patrick Holt Birthdate: [birth year] Sex: male Admission Date (Current Location): 11/20/2016  [facility] and [place] Number:  Herbalist and Address:  [facility], [place] [address], [place], [place] [address]      Provider Number: [id number]  Attending Physician Name and Address:  Patrecia Pour, Christean Grief, MD  Relative Name and Phone Number:  Ivin Booty, friend, [phone]    Current Level of Care: Hospital Recommended Level of Care: [place] Prior Approval Number:    Date Approved/Denied:   PASRR Number:    Discharge Plan: SNF    Current Diagnoses: Patient Active Problem List   Diagnosis Date Noted  . Low oxygen saturation   . Pain in right wrist 02/27/2016  . Altered mental state 11/14/2015  . Severe dehydration 11/14/2015  . Hypernatremia 11/14/2015  . Difficulty in walking, not elsewhere classified   . Right wrist fracture   . Closed displaced fracture of ulnar styloid with routine healing   . Scapholunate ligament injury with no instability   . Falls frequently   . Hypoxia 11/02/2015  . Anxiety 06/06/2015  . Impaired mobility 04/28/2015  . Lumbar spondylosis with myelopathy 03/07/2015  . Nocturia 12/03/2014  . Chronic venous insufficiency 09/23/2014  . Poor social situation 07/30/2014  . Schizophrenia, acute ([place])   . Schizoaffective disorder, unspecified type ([place])   . Decreased visual acuity 06/27/2014  . COPD exacerbation ([place])   . Right sided weakness 01/04/2014  . Essential hypertension, benign   . Atrial fibrillation, unspecified   . Radiculopathy of cervical region   . Polyuria 12/07/2013  . Erectile dysfunction 12/07/2013  . Diverticulitis 08/01/2013  . Acute diverticulitis 08/01/2013  . Hematochezia 06/04/2013  . Hypokalemia 10/03/2012  . Benign neoplasm of colon 09/27/2012  . Stricture and stenosis of esophagus  09/27/2012  . Diverticulitis of colon without hemorrhage 07/26/2012  . Polypharmacy 03/26/2012  . Seizures ([place]) 02/10/2012  . Atrial fibrillation ([place]) 02/05/2012  . Coronary atherosclerosis of native coronary artery 02/05/2012  . Radicular low back pain 03/09/2011  . History of seizure disorder 01/29/2011  . Irritable bowel syndrome (IBS) 06/24/2010  . Urge incontinence 06/19/2010  . TOBACCO USER 11/23/2008  . DEPRESSION, MAJOR 05/31/2008  . ISCHEMIC HEART DISEASE 05/31/2008  . RAYNAUD'S DISEASE 05/31/2008  . HIATAL HERNIA 05/31/2008  . Rheumatoid arthritis ([place]) 05/31/2008  . FIBROMYALGIA 05/31/2008  . Hyperlipidemia 01/26/2008  . Anxiety state 01/26/2008  . HEMORRHOIDS, INTERNAL 01/26/2008  . COPD (chronic obstructive pulmonary disease) ([place]) 01/26/2008  . ESOPHAGEAL MOTILITY DISORDER 01/26/2008  . GERD 01/26/2008  . Dermatomyositis ([place]) 01/26/2008    Orientation RESPIRATION BLADDER Height & Weight     Self, Time, Situation, Place  Normal Continent Weight: 103.4 kg (228 lb) Height:  5\' 6"  (167.6 cm)  BEHAVIORAL SYMPTOMS/MOOD NEUROLOGICAL BOWEL NUTRITION STATUS      Continent Diet (Please see DC Summary)  AMBULATORY STATUS COMMUNICATION OF NEEDS Skin   Limited Assist Verbally Normal                       Personal Care Assistance Level of Assistance  Bathing, Feeding, Dressing Bathing Assistance: Limited assistance Feeding assistance: Independent Dressing Assistance: Limited assistance     Functional Limitations Info             SPECIAL CARE FACTORS FREQUENCY  PT (By licensed PT)     PT Frequency:  5x/week              Contractures      Additional Factors Info  Code Status, Allergies, Psychotropic, Insulin Sliding Scale Code Status Info: Full Code Allergies Info: Immune Globulins, Ciprofloxacin, Flagyl Metronidazole, Lisinopril, Sulfa Antibiotics, Requip Ropinirole Hcl, Betamethasone Dipropionate, Bupropion Hcl, Clobetasol, Codeine,  Escitalopram Oxalate, Fluoxetine Hcl, Furosemide, Meclizine, Paroxetine, Penicillins, Tacrolimus, Tetanus Toxoid, Tuberculin Purified Protein Derivative Psychotropic Info: Risperadal; Xanax Insulin Sliding Scale Info: 3x daily with meals and at bedtime       Current Medications (11/22/2016):  This is the current hospital active medication list Current Facility-Administered Medications  Medication Dose Route Frequency Provider Last Rate Last Dose  . acetaminophen (TYLENOL) tablet 650 mg  650 mg Oral Q6H PRN Jani Gravel, MD       Or  . acetaminophen (TYLENOL) suppository 650 mg  650 mg Rectal Q6H PRN Jani Gravel, MD      . ALPRAZolam Duanne Moron) tablet 1 mg  1 mg Oral TID Jani Gravel, MD   1 mg at 11/22/16 0174  . amiodarone (PACERONE) tablet 100 mg  100 mg Oral Daily Jani Gravel, MD   100 mg at 11/22/16 0903  . amLODipine (NORVASC) tablet 5 mg  5 mg Oral Daily Jani Gravel, MD   5 mg at 11/22/16 9449  . arformoterol (BROVANA) nebulizer solution 15 mcg  15 mcg Nebulization BID Patrecia Pour, Christean Grief, MD   15 mcg at 11/21/16 2147  . aspirin EC tablet 81 mg  81 mg Oral Daily Jani Gravel, MD   81 mg at 11/22/16 6759  . atorvastatin (LIPITOR) tablet 40 mg  40 mg Oral Loma Sousa, MD   40 mg at 11/21/16 2201  . [START ON 11/23/2016] azithromycin (ZITHROMAX) tablet 500 mg  500 mg Oral Once Patrecia Pour, Christean Grief, MD      . budesonide (PULMICORT) nebulizer solution 0.25 mg  0.25 mg Nebulization BID Patrecia Pour, Christean Grief, MD   0.25 mg at 11/21/16 2148  . divalproex (DEPAKOTE ER) 24 hr tablet 750 mg  750 mg Oral Daily Jani Gravel, MD   750 mg at 11/22/16 0904  . enoxaparin (LOVENOX) injection 40 mg  40 mg Subcutaneous Q24H Jani Gravel, MD   40 mg at 11/22/16 0904  . insulin aspart (novoLOG) injection 0-5 Units  0-5 Units Subcutaneous QHS Jani Gravel, MD      . insulin aspart (novoLOG) injection 0-9 Units  0-9 Units Subcutaneous TID WC Jani Gravel, MD   5 Units at 11/21/16 1852  . ipratropium-albuterol (DUONEB) 0.5-2.5 (3)  MG/3ML nebulizer solution 3 mL  3 mL Inhalation Q6H PRN Jani Gravel, MD      . levETIRAcetam (KEPPRA) tablet 500 mg  500 mg Oral BID Jani Gravel, MD   500 mg at 11/22/16 1638  . loperamide (IMODIUM) capsule 2 mg  2 mg Oral Q8H PRN Jani Gravel, MD   2 mg at 11/21/16 4665  . multivitamin with minerals tablet 1 tablet  1 tablet Oral Daily Jani Gravel, MD   1 tablet at 11/22/16 [phone]  . oxybutynin (DITROPAN) tablet 5 mg  5 mg Oral Daily Jani Gravel, MD   5 mg at 11/22/16 0904  . pantoprazole (PROTONIX) EC tablet 40 mg  40 mg Oral Daily Jani Gravel, MD   40 mg at 11/22/16 0904  . potassium chloride (K-DUR,KLOR-CON) CR tablet 20 mEq  20 mEq Oral Oretha Milch, MD   20 mEq at 11/21/16 0904  . predniSONE (DELTASONE) tablet 60  mg  60 mg Oral Q breakfast Jani Gravel, MD   60 mg at 11/22/16 4431  . risperiDONE (RISPERDAL) tablet 1 mg  1 mg Oral BID Jani Gravel, MD   1 mg at 11/22/16 5400     Discharge Medications: Please see discharge summary for a list of discharge medications.  Relevant Imaging Results:  Relevant Lab Results:   Additional Information SS#: [id number]  Benard Halsted, LCSWA

## 2016-11-22 NOTE — Progress Notes (Signed)
CSW alerted that patient refusing SNF and will return home. Patient has no transportation/money and does not walk well. CSW arranging PTAR.  CSW signing off.  Percell Locus Teleshia Lemere LCSWA [phone]

## 2016-12-10 ENCOUNTER — Encounter (HOSPITAL_COMMUNITY): Payer: Self-pay

## 2016-12-10 ENCOUNTER — Emergency Department (HOSPITAL_COMMUNITY): Payer: Medicare Other

## 2016-12-10 ENCOUNTER — Emergency Department (HOSPITAL_COMMUNITY)
Admission: EM | Admit: 2016-12-10 | Discharge: 2016-12-10 | Disposition: A | Discharge status: Home / Self Care | Payer: Medicare Other | Attending: Emergency Medicine | Admitting: Emergency Medicine

## 2016-12-10 DIAGNOSIS — J449 Chronic obstructive pulmonary disease, unspecified: Secondary | ICD-10-CM | POA: Insufficient documentation

## 2016-12-10 DIAGNOSIS — Y999 Unspecified external cause status: Secondary | ICD-10-CM | POA: Insufficient documentation

## 2016-12-10 DIAGNOSIS — S20229A Contusion of unspecified back wall of thorax, initial encounter: Secondary | ICD-10-CM | POA: Insufficient documentation

## 2016-12-10 DIAGNOSIS — E119 Type 2 diabetes mellitus without complications: Secondary | ICD-10-CM | POA: Insufficient documentation

## 2016-12-10 DIAGNOSIS — J45909 Unspecified asthma, uncomplicated: Secondary | ICD-10-CM | POA: Diagnosis not present

## 2016-12-10 DIAGNOSIS — R1084 Generalized abdominal pain: Secondary | ICD-10-CM | POA: Insufficient documentation

## 2016-12-10 DIAGNOSIS — Z87891 Personal history of nicotine dependence: Secondary | ICD-10-CM | POA: Insufficient documentation

## 2016-12-10 DIAGNOSIS — Y929 Unspecified place or not applicable: Secondary | ICD-10-CM | POA: Insufficient documentation

## 2016-12-10 DIAGNOSIS — W19XXXA Unspecified fall, initial encounter: Secondary | ICD-10-CM | POA: Insufficient documentation

## 2016-12-10 DIAGNOSIS — I10 Essential (primary) hypertension: Secondary | ICD-10-CM | POA: Insufficient documentation

## 2016-12-10 DIAGNOSIS — Y9389 Activity, other specified: Secondary | ICD-10-CM | POA: Diagnosis not present

## 2016-12-10 DIAGNOSIS — Z79899 Other long term (current) drug therapy: Secondary | ICD-10-CM | POA: Insufficient documentation

## 2016-12-10 DIAGNOSIS — I4891 Unspecified atrial fibrillation: Secondary | ICD-10-CM | POA: Diagnosis not present

## 2016-12-10 DIAGNOSIS — S299XXA Unspecified injury of thorax, initial encounter: Secondary | ICD-10-CM | POA: Diagnosis present

## 2016-12-10 DIAGNOSIS — Z7982 Long term (current) use of aspirin: Secondary | ICD-10-CM | POA: Diagnosis not present

## 2016-12-10 DIAGNOSIS — D649 Anemia, unspecified: Secondary | ICD-10-CM | POA: Insufficient documentation

## 2016-12-10 MED ORDER — OXYCODONE-ACETAMINOPHEN 5-325 MG PO TABS
1.0000 | ORAL_TABLET | Freq: Once | ORAL | Status: AC
  Administered 2016-12-10: 1 via ORAL
  Filled 2016-12-10: qty 1

## 2016-12-10 NOTE — ED Notes (Signed)
PTAR called for transport.  

## 2016-12-10 NOTE — ED Provider Notes (Signed)
[facility] DEPT Provider Note   CSN: [id number] Arrival date & time: 12/10/16  1520     History   Chief Complaint Chief Complaint  Patient presents with  . Fall    HPI DUB MACLELLAN is a 66 y.o. male.  Patient states that he fell at home and is complaining of lower back and mid back pain.  Patient states he usually gets around with a wheelchair at home   The history is provided by the patient.  Fall  This is a new problem. The current episode started 3 to 5 hours ago. The problem occurs rarely. The problem has been resolved. Pertinent negatives include no chest pain, no abdominal pain and no headaches. Exacerbated by: Movement. Nothing relieves the symptoms. He has tried nothing for the symptoms. The treatment provided no relief.    Past Medical History:  Diagnosis Date  . Anemia   . Anxiety   . Asthma   . Atrial fibrillation ([place]) 01/2012   with RVR  . Bipolar 1 disorder ([place])   . Cancer ([place])   . Collagen vascular disease ([place])   . Conversion disorder 06/2010  . COPD (chronic obstructive pulmonary disease) ([place])   . Dermatomyositis ([place])   . Dermatomyositis ([place])   . Diverticulitis   . Diverticulosis of colon   . Dysrhythmia    "irregular" (11/15/2012)  . Esophageal dysmotility   . Esophageal stricture   . Fibromyalgia   . Gastritis   . GERD (gastroesophageal reflux disease)   . Headache(784.0)    "severe; get them often" (11/15/2012)  . Hiatal hernia   . History of narcotic addiction ([place])   . Hx of adenomatous colonic polyps   . Hyperlipidemia   . Hypertension   . Internal hemorrhoids   . Ischemic heart disease   . Major depression    with acute psychotic break in 06/2010  . Myocardial infarct ([place])    mulitple (1999, 2000, 2004)  . Narcotic dependence ([place])   . Nephrolithiasis   . Obesity   . OCD (obsessive compulsive disorder)   . Otosclerosis   . Paroxysmal A-fib ([place])   . Peripheral neuropathy   . Raynaud's  disease   . Renal insufficiency   . Rheumatoid arthritis(714.0)   . Sarcoidosis   . Seizures ([place])   . Subarachnoid hemorrhage ([place]) 01/2012   with subdural  hematoma.   . Type II diabetes mellitus ([place])   . Urge incontinence   . Vertigo     Patient Active Problem List   Diagnosis Date Noted  . Low oxygen saturation   . Pain in right wrist 02/27/2016  . Altered mental state 11/14/2015  . Severe dehydration 11/14/2015  . Hypernatremia 11/14/2015  . Difficulty in walking, not elsewhere classified   . Right wrist fracture   . Closed displaced fracture of ulnar styloid with routine healing   . Scapholunate ligament injury with no instability   . Falls frequently   . Hypoxia 11/02/2015  . Anxiety 06/06/2015  . Impaired mobility 04/28/2015  . Lumbar spondylosis with myelopathy 03/07/2015  . Nocturia 12/03/2014  . Chronic venous insufficiency 09/23/2014  . Poor social situation 07/30/2014  . Schizophrenia, acute ([place])   . Schizoaffective disorder, unspecified type ([place])   . Decreased visual acuity 06/27/2014  . COPD exacerbation ([place])   . Right sided weakness 01/04/2014  . Essential hypertension, benign   . Atrial fibrillation, unspecified   . Radiculopathy of cervical region   . Polyuria 12/07/2013  .  Erectile dysfunction 12/07/2013  . Diverticulitis 08/01/2013  . Acute diverticulitis 08/01/2013  . Hematochezia 06/04/2013  . Hypokalemia 10/03/2012  . Benign neoplasm of colon 09/27/2012  . Stricture and stenosis of esophagus 09/27/2012  . Diverticulitis of colon without hemorrhage 07/26/2012  . Polypharmacy 03/26/2012  . Seizures ([place]) 02/10/2012  . Atrial fibrillation ([place]) 02/05/2012  . Coronary atherosclerosis of native coronary artery 02/05/2012  . Radicular low back pain 03/09/2011  . History of seizure disorder 01/29/2011  . Irritable bowel syndrome (IBS) 06/24/2010  . Urge incontinence 06/19/2010  . TOBACCO USER 11/23/2008  . DEPRESSION, MAJOR 05/31/2008  .  ISCHEMIC HEART DISEASE 05/31/2008  . RAYNAUD'S DISEASE 05/31/2008  . HIATAL HERNIA 05/31/2008  . Rheumatoid arthritis ([place]) 05/31/2008  . FIBROMYALGIA 05/31/2008  . Hyperlipidemia 01/26/2008  . Anxiety state 01/26/2008  . HEMORRHOIDS, INTERNAL 01/26/2008  . COPD (chronic obstructive pulmonary disease) ([place]) 01/26/2008  . ESOPHAGEAL MOTILITY DISORDER 01/26/2008  . GERD 01/26/2008  . Dermatomyositis ([place]) 01/26/2008    Past Surgical History:  Procedure Laterality Date  . BACK SURGERY    . CARDIAC CATHETERIZATION    . CARPAL TUNNEL RELEASE Bilateral   . CATARACT EXTRACTION W/ INTRAOCULAR LENS IMPLANT Left   . COLONOSCOPY N/A 09/27/2012   Procedure: COLONOSCOPY;  Surgeon: Lafayette Dragon, MD;  Location: [facility] ENDOSCOPY;  Service: Endoscopy;  Laterality: N/A;  . ESOPHAGOGASTRODUODENOSCOPY N/A 09/27/2012   Procedure: ESOPHAGOGASTRODUODENOSCOPY (EGD);  Surgeon: Lafayette Dragon, MD;  Location: [facility];  Service: Endoscopy;  Laterality: N/A;  . KNEE ARTHROSCOPY W/ MENISCAL REPAIR Left 2009  . LUMBAR DISC SURGERY    . LYMPH NODE DISSECTION Right 1970's   "neck; dr thought I had Hodgkins; turned out to be sarcoidosis" (11/15/2012)  . squamous papilloma   2010   removed by Dr. Constance Holster ENT, noted on tongue  . TONSILLECTOMY         Home Medications    Prior to Admission medications   Medication Sig Start Date End Date Taking? Authorizing Provider  ALPRAZolam Duanne Moron) 1 MG tablet Take 1 mg by mouth 3 (three) times daily.    [provider]  amiodarone (PACERONE) 200 MG tablet Take 0.5 tablets (100 mg total) by mouth daily. 10/17/15   [name], MD  amLODipine (NORVASC) 5 MG tablet Take 1 tablet (5 mg total) by mouth daily. 11/05/15   Olivet Bing, DO  aspirin EC 81 MG tablet Take 1 tablet (81 mg total) by mouth daily. 10/17/15   [name], MD  atorvastatin (LIPITOR) 40 MG tablet Take 1 tablet (40 mg total) by mouth daily. Patient taking differently: Take 40 mg by mouth at  bedtime.  10/17/15   [name], MD  budesonide-formoterol (SYMBICORT) 160-4.5 MCG/ACT inhaler Inhale 2 puffs into the lungs 2 (two) times daily. 11/22/16   Doreatha Lew, MD  divalproex (DEPAKOTE ER) 250 MG 24 hr tablet Take 3 tablets (750 mg total) by mouth daily. 12/15/15   [name], MD  esomeprazole (NEXIUM) 40 MG capsule Take 40 mg by mouth daily. 07/26/16   [provider]  ipratropium-albuterol (DUONEB) 0.5-2.5 (3) MG/3ML SOLN Inhale 3 mLs into the lungs every 6 (six) hours as needed. Patient taking differently: Inhale 3 mLs into the lungs every 6 (six) hours as needed (for breathing).  11/17/15   Elgergawy, Silver Huguenin, MD  levETIRAcetam (KEPPRA) 750 MG tablet Take 1 tablet (750 mg total) by mouth 2 (two) times daily. TAKE 1 TABLET(500 MG) BY MOUTH TWICE DAILY  11/22/16   Doreatha Lew, MD  loperamide (IMODIUM) 2 MG capsule TAKE 1 TO 2 CAPSULES BY MOUTH 3 TIMES A DAY AS NEEDED FOR DIARRHEA Patient taking differently: Take 2 mg by mouth every 8 (eight) hours as needed for diarrhea or loose stools.  08/20/15   [name], MD  Multiple Vitamin (MULTIVITAMIN WITH MINERALS) TABS tablet Take 1 tablet by mouth daily. 10/17/15   [name], MD  oxybutynin (DITROPAN) 5 MG tablet Take 5 mg by mouth daily. 07/26/16   [provider]  Potassium Chloride ER 20 MEQ TBCR Take 1 tablet by mouth every other day.  11/29/14   [provider]  predniSONE (DELTASONE) 10 MG tablet Take 4 tablets for 3 days; Take 3 tablets for 4 days; Take 2 tablets for 3 days; Take 1 tablet for 4 days 11/22/16   Patrecia Pour, Christean Grief, MD  risperiDONE (RISPERDAL) 1 MG tablet Take 1 tablet (1 mg total) by mouth 2 (two) times daily. 12/15/15   [name], MD    Family History Family History  Problem Relation Age of Onset  . Alcohol abuse Mother   . Depression Mother   . Heart disease Mother   . Diabetes Mother   . Stroke Mother   . Coronary artery disease Father   . Diabetes Other         1/2 brother  . Hepatitis Brother     Social History Social History  Substance Use Topics  . Smoking status: Former Smoker    Packs/day: 0.50    Years: 30.00    Types: Cigarettes    Quit date: 12/15/2014  . Smokeless tobacco: Never Used     Comment: 3 cigs a day  . Alcohol use No     Comment: Alcohol stopped in September of 2014     Allergies   Immune globulins; Ciprofloxacin; Flagyl [metronidazole]; Lisinopril; Sulfa antibiotics; Requip [ropinirole hcl]; Betamethasone dipropionate; Bupropion hcl; Clobetasol; Codeine; Escitalopram oxalate; Fluoxetine hcl; Furosemide; Meclizine; Paroxetine; Penicillins; Tacrolimus; Tetanus toxoid; and Tuberculin purified protein derivative   Review of Systems Review of Systems  Constitutional: Negative for appetite change and fatigue.  HENT: Negative for congestion, ear discharge and sinus pressure.   Eyes: Negative for discharge.  Respiratory: Negative for cough.   Cardiovascular: Negative for chest pain.  Gastrointestinal: Negative for abdominal pain and diarrhea.  Genitourinary: Negative for frequency and hematuria.  Musculoskeletal: Positive for back pain.  Skin: Negative for rash.  Neurological: Negative for seizures and headaches.  Psychiatric/Behavioral: Negative for hallucinations.     Physical Exam Updated Vital Signs BP 119/67 (BP Location: Left Arm)   Pulse 76   Temp 98.3 F (36.8 C) (Oral)   Resp 18   SpO2 93%   Physical Exam  Constitutional: He is oriented to person, place, and time. He appears well-developed.  HENT:  Head: Normocephalic.  Eyes: Conjunctivae and EOM are normal. No scleral icterus.  Neck: Neck supple. No thyromegaly present.  Cardiovascular: Normal rate and regular rhythm.  Exam reveals no gallop and no friction rub.   No murmur heard. Pulmonary/Chest: No stridor. He has no wheezes. He has no rales. He exhibits no tenderness.  Abdominal: He exhibits no distension. There is no tenderness. There is no  rebound.  Musculoskeletal: Normal range of motion. He exhibits no edema.  Moderate tenderness to upper lumbar spine  Lymphadenopathy:    He has no cervical adenopathy.  Neurological: He is oriented to person, place, and time. He exhibits normal  muscle tone. Coordination normal.  Skin: No rash noted. No erythema.  Psychiatric: He has a normal mood and affect. His behavior is normal.     ED Treatments / Results  Labs (all labs ordered are listed, but only abnormal results are displayed) Labs Reviewed - No data to display  EKG  EKG Interpretation None       Radiology Ct Abdomen Pelvis Wo Contrast  Result Date: 12/10/2016 CLINICAL DATA:  Fall today while walking. Multiple previous although chronic back pain. EXAM: CT ABDOMEN AND PELVIS WITHOUT CONTRAST TECHNIQUE: Multidetector CT imaging of the abdomen and pelvis was performed following the standard protocol without IV contrast. COMPARISON:  03/30/2016 and 12/30/2014 FINDINGS: Lower chest: Mild bibasilar scarring and minimal bronchiectatic change. Hepatobiliary: Normal. Pancreas: Normal. Spleen: Normal. Adrenals/Urinary Tract: Adrenal glands are normal. Kidneys are normal in size without hydronephrosis. There is a 3 mm stone over the mid pole left renal collecting system. 1.4 cm cyst over the mid to upper left renal cortex. Subcentimeter hyperdensity over the mid to upper pole right renal cortex too small to characterize but likely a cyst. Minimal scarring upper pole right kidney. Ureters and bladder are normal. Stomach/Bowel: Stomach and small bowel are normal. Appendix is normal. There is diverticulosis of the colon most prominent over the sigmoid colon. Vascular/Lymphatic: Moderate calcified plaque over the abdominal aorta. No adenopathy. Reproductive: Within normal. Other: No free fluid or focal inflammatory change. Musculoskeletal: There are degenerative changes of the spine with stable grade 1 anterolisthesis of L5 on S1. Mild degenerate  change of the hips. IMPRESSION: No acute findings in the abdomen/pelvis. 3 mm nonobstructing left renal stone. Bilateral renal cysts. Bibasilar scarring with bronchiectatic change. Colonic diverticulosis. Aortic Atherosclerosis (ICD10-I70.0). Stable grade 1 anterolisthesis of L5 on S1. Electronically Signed   By: Marin Olp M.D.   On: 12/10/2016 18:38    Procedures Procedures (including critical care time)  Medications Ordered in ED Medications  oxyCODONE-acetaminophen (PERCOCET/ROXICET) 5-325 MG per tablet 1 tablet (1 tablet Oral Given 12/10/16 1849)     Initial Impression / Assessment and Plan / ED Course  I have reviewed the triage vital signs and the nursing notes.  Pertinent labs & imaging results that were available during my care of the patient were reviewed by me and considered in my medical decision making (see chart for details). CT scan of abdomen back remarkable patient with fall contusion to upper back and lower back.  Social worker spoke with patient and has arranged to get an evaluation at home and possibly home health     Final Clinical Impressions(s) / ED Diagnoses   Final diagnoses:  Fall, initial encounter    New Prescriptions New Prescriptions   No medications on file     Milton Ferguson, MD 12/10/16 [phone]

## 2016-12-10 NOTE — Care Management Note (Signed)
Case Management Note  Patient Details  Name: Patrick Holt MRN: [id number] Date of Birth: February 06, 1951  Subjective/Objective:                   66 y.o. male that fell at home and is complaining of lower back and mid back pain.  Patient states he usually gets around with a wheelchair at home.  Action/Plan: CM consulted for possible HH needs.  Spoke with pt who states he has all of the DME he needs.  Discussed possible HHS to which pt was agreeable.  Pt chose [facility].  Additionally discussed the possibility of SNF as per previous D/C from admission but pt declined.  Spoke with Tim, rep for [facility] who accepted the pt.  No further CM needs noted at this time.  Expected Discharge Date:    12/10/2016              Expected Discharge Plan:  [place]  Discharge planning Services  CM Consult  Post Acute Care Choice:  Home Health Choice offered to:  Patient  HH Arranged:  RN, PT, OT, Nurse's Aide, Social Work [employer] Agency:  [facility] [facility] [employer]  Status of Service:  Completed, signed off  Chrisanne Loose, Benjaman Lobe, RN 12/10/2016, 6:53 PM

## 2016-12-10 NOTE — Discharge Instructions (Signed)
Followup with your doctor as needed

## 2016-12-10 NOTE — ED Notes (Signed)
Patient transported to CT 

## 2016-12-10 NOTE — ED Triage Notes (Signed)
  Pt arrived via EMS from home. Pt is c/o fall as he was ambulating with can lost grip on cane and fell, c/o back pain. Pt has a hx of chronic back pain and multiple hx of falls. Per EMS no obvious injuries.   VS  BP 116/91 HR 88 RR 18, 90% O2  on RA

## 2016-12-16 ENCOUNTER — Encounter (HOSPITAL_COMMUNITY): Payer: Self-pay | Admitting: Emergency Medicine

## 2016-12-16 ENCOUNTER — Emergency Department (HOSPITAL_COMMUNITY)
Admission: EM | Admit: 2016-12-16 | Discharge: 2016-12-17 | Discharge status: Against Medical Advice | Payer: Medicare Other | Attending: Emergency Medicine | Admitting: Emergency Medicine

## 2016-12-16 DIAGNOSIS — Z7982 Long term (current) use of aspirin: Secondary | ICD-10-CM | POA: Insufficient documentation

## 2016-12-16 DIAGNOSIS — R0902 Hypoxemia: Secondary | ICD-10-CM | POA: Diagnosis not present

## 2016-12-16 DIAGNOSIS — Z5329 Procedure and treatment not carried out because of patient's decision for other reasons: Secondary | ICD-10-CM | POA: Insufficient documentation

## 2016-12-16 DIAGNOSIS — J45909 Unspecified asthma, uncomplicated: Secondary | ICD-10-CM | POA: Diagnosis not present

## 2016-12-16 DIAGNOSIS — Z859 Personal history of malignant neoplasm, unspecified: Secondary | ICD-10-CM | POA: Insufficient documentation

## 2016-12-16 DIAGNOSIS — I129 Hypertensive chronic kidney disease with stage 1 through stage 4 chronic kidney disease, or unspecified chronic kidney disease: Secondary | ICD-10-CM | POA: Insufficient documentation

## 2016-12-16 DIAGNOSIS — Y999 Unspecified external cause status: Secondary | ICD-10-CM | POA: Insufficient documentation

## 2016-12-16 DIAGNOSIS — W01198A Fall on same level from slipping, tripping and stumbling with subsequent striking against other object, initial encounter: Secondary | ICD-10-CM | POA: Diagnosis not present

## 2016-12-16 DIAGNOSIS — I2510 Atherosclerotic heart disease of native coronary artery without angina pectoris: Secondary | ICD-10-CM | POA: Insufficient documentation

## 2016-12-16 DIAGNOSIS — E1122 Type 2 diabetes mellitus with diabetic chronic kidney disease: Secondary | ICD-10-CM | POA: Insufficient documentation

## 2016-12-16 DIAGNOSIS — Y9301 Activity, walking, marching and hiking: Secondary | ICD-10-CM | POA: Insufficient documentation

## 2016-12-16 DIAGNOSIS — Z79899 Other long term (current) drug therapy: Secondary | ICD-10-CM | POA: Diagnosis not present

## 2016-12-16 DIAGNOSIS — Y92009 Unspecified place in unspecified non-institutional (private) residence as the place of occurrence of the external cause: Secondary | ICD-10-CM | POA: Diagnosis not present

## 2016-12-16 DIAGNOSIS — S0990XA Unspecified injury of head, initial encounter: Secondary | ICD-10-CM | POA: Diagnosis not present

## 2016-12-16 DIAGNOSIS — N189 Chronic kidney disease, unspecified: Secondary | ICD-10-CM | POA: Insufficient documentation

## 2016-12-16 DIAGNOSIS — D126 Benign neoplasm of colon, unspecified: Secondary | ICD-10-CM | POA: Diagnosis not present

## 2016-12-16 DIAGNOSIS — W19XXXA Unspecified fall, initial encounter: Secondary | ICD-10-CM

## 2016-12-16 DIAGNOSIS — Z87891 Personal history of nicotine dependence: Secondary | ICD-10-CM | POA: Insufficient documentation

## 2016-12-16 DIAGNOSIS — J449 Chronic obstructive pulmonary disease, unspecified: Secondary | ICD-10-CM | POA: Insufficient documentation

## 2016-12-16 NOTE — ED Triage Notes (Signed)
Per EMS, pt from home. Pt reports walking with his walker when it got stuck on panel and pt fell, hitting his head on the wall. 1 cm lac to rt eyebrow, bleeding controlled. This is pt's 2nd fall today. No LOC. Pt reports 10/10 new lumbar back pain, w/ hx of chronic neck/back pain.  EMS VS BP 132/82, 88 HR, 18 R.

## 2016-12-17 ENCOUNTER — Emergency Department (HOSPITAL_COMMUNITY): Payer: Medicare Other

## 2016-12-17 LAB — BASIC METABOLIC PANEL
ANION GAP: 5 (ref 5–15)
BUN: 14 mg/dL (ref 6–20)
CALCIUM: 8.5 mg/dL — AB (ref 8.9–10.3)
CHLORIDE: 107 mmol/L (ref 101–111)
CO2: 28 mmol/L (ref 22–32)
CREATININE: 1.05 mg/dL (ref 0.61–1.24)
GFR calc non Af Amer: >60 mL/min (ref >60)
Glucose, Bld: 89 mg/dL (ref 65–99)
Potassium: 3.7 mmol/L (ref 3.5–5.1)
SODIUM: 140 mmol/L (ref 135–145)

## 2016-12-17 LAB — CBC WITH DIFFERENTIAL/PLATELET
BASOS ABS: 0 10*3/uL (ref 0.0–0.1)
BASOS PCT: 0 %
EOS ABS: 0.2 10*3/uL (ref 0.0–0.7)
EOS PCT: 3 %
HCT: 41.2 % (ref 39.0–52.0)
Hemoglobin: 12.8 g/dL — ABNORMAL LOW (ref 13.0–17.0)
LYMPHS PCT: 28 %
Lymphs Abs: 2 10*3/uL (ref 0.7–4.0)
MCH: 25.8 pg — ABNORMAL LOW (ref 26.0–34.0)
MCHC: 31.1 g/dL (ref 30.0–36.0)
MCV: 83.1 fL (ref 78.0–100.0)
Monocytes Absolute: 0.7 10*3/uL (ref 0.1–1.0)
Monocytes Relative: 10 %
Neutro Abs: 4.4 10*3/uL (ref 1.7–7.7)
Neutrophils Relative %: 59 %
PLATELETS: 212 10*3/uL (ref 150–400)
RBC: 4.96 MIL/uL (ref 4.22–5.81)
RDW: 16.1 % — ABNORMAL HIGH (ref 11.5–15.5)
WBC: 7.3 10*3/uL (ref 4.0–10.5)

## 2016-12-17 MED ORDER — IPRATROPIUM-ALBUTEROL 0.5-2.5 (3) MG/3ML IN SOLN
3.0000 mL | Freq: Once | RESPIRATORY_TRACT | Status: AC
  Administered 2016-12-17: 3 mL via RESPIRATORY_TRACT
  Filled 2016-12-17: qty 3

## 2016-12-17 MED ORDER — BACITRACIN ZINC 500 UNIT/GM EX OINT
1.0000 "application " | TOPICAL_OINTMENT | Freq: Two times a day (BID) | CUTANEOUS | Status: DC
  Administered 2016-12-17: 1 via TOPICAL

## 2016-12-17 NOTE — ED Notes (Signed)
Pt placed on 3L [place] d/t SpO2 saturations in the 80s.

## 2016-12-17 NOTE — Discharge Instructions (Signed)
Please read and follow all provided instructions.  Your diagnoses today include:  1. Fall, initial encounter   2. Injury of head, initial encounter   3. Hypoxia     Tests performed today include:  CT scan of your head and neck that did not show any serious injury.  X-ray of your chest and back - no serious injury  Oxygen level - low  Vital signs. See below for your results today.   Medications prescribed:   None  Take any prescribed medications only as directed.  Home care instructions:  Follow any educational materials contained in this packet.  Follow-up instructions: Please follow-up with your primary care provider in the next 3 days for further evaluation of your symptoms.   Return instructions:  SEEK IMMEDIATE MEDICAL ATTENTION IF:  There is confusion or drowsiness (although children frequently become drowsy after injury).   You cannot awaken the injured person.   You have more than one episode of vomiting.   You notice dizziness or unsteadiness which is getting worse, or inability to walk.   You have convulsions or unconsciousness.   You experience severe, persistent headaches not relieved by Tylenol.  You cannot use arms or legs normally.   There are changes in pupil sizes. (This is the black center in the colored part of the eye)   There is clear or bloody discharge from the nose or ears.   You have change in speech, vision, swallowing, or understanding.   Localized weakness, numbness, tingling, or change in bowel or bladder control.  You have any other emergent concerns.  Additional Information: You have had a head injury which does not appear to require admission at this time.  Your vital signs today were: BP 126/74    Pulse 78    Temp 98.6 F (37 C) (Oral)    Resp 18    Ht 5\' 7"  (1.702 m)    Wt 102.1 kg (225 lb)    SpO2 92%    BMI 35.24 kg/m  If your blood pressure (BP) was elevated above 135/85 this visit, please have this repeated by your  doctor within one month. --------------

## 2016-12-17 NOTE — ED Notes (Signed)
Patient refused to stay, MD spoke with patient about risk. RN requested patient to wear O2 while waiting in lobby patient refused.

## 2016-12-17 NOTE — ED Notes (Signed)
Pt taken off [place] onto room air and SpO2 saturation dropped to 84%. EDP aware.

## 2016-12-17 NOTE — Discharge Planning (Signed)
Fuller Mandril, RN, BSN, [place] [phone] Assessment:  patient has resting pulse ox of 85-86% at baseline, asymptomatic with this, and needs chronic home O2.  Being that ED is acute setting, Pt can not qualify for home O2.  Pt will need to be admitted for observation to qualify.

## 2016-12-17 NOTE — ED Provider Notes (Cosign Needed)
8:59 AM handoff from [facility] at shift change.  Patient with muscular dystrophy presented after a fall last night.  Complains of back pain.  Patient had head CT, cervical spine CT, lumbar spine film and chest x-ray.  He was noted to be hypoxic.  He is likely chronically hypoxic due to COPD.  He is not currently on home oxygen.  Plan was for case manager to see in the morning.  They have seen, they are unable to qualify the patient for home oxygen services and would require to admission to the hospital.  I spoke with the patient regarding this.  He only wants to go home.  We discussed the benefits of home oxygen but that he does not want to stay in the hospital today.  He has an appointment with his primary care physician next week and would rather follow up with them.  He states that he does not have any shortness of breath from his breathing standpoint.  On exam patient is awake and alert.  He remembers details regarding his fall.  He states that he normally gets around at home with a wheelchair, walker, or cane.  No significant wheezing on exam.  Oxygen at 92% on 2 L nasal cannula.  BP 126/74   Pulse 81   Temp 98.6 F (37 C) (Oral)   Resp 18   Ht 5\' 7"  (1.702 m)   Wt 102.1 kg (225 lb)   SpO2 93%   BMI 35.24 kg/m   Discussed and seen with Dr. Jeneen Rinks. Will d/c AMA. Pt appears to understand his situation and will be provided medications for COPD exacerbation.  Strongly encouraged to follow-up with PCP, return to the emergency department with any worsening symptoms or trouble breathing.   Carlisle Cater, PA-C 12/17/16 1004

## 2016-12-17 NOTE — ED Provider Notes (Signed)
[facility] EMERGENCY DEPARTMENT Provider Note   CSN: [id number] Arrival date & time: 12/16/16  2349     History   Chief Complaint Chief Complaint  Patient presents with  . Fall    HPI Patrick Holt is a 66 y.o. male.  Patient presents to the ED with a chief complaint of fall.  He states that he was walking with his walker, when his walker slipped out from underneath him and and fell, striking his head on the counter top.  He denies LOC.  He denies any headache, vision changes, speech changes, numbness, tingling, or new weakness.  He states that he has frequent falls.  He complains of chronic pain in his neck and back. He denies any new symptoms with regard to this back pain.  Of note, he has a documented history of low O2 sat, but does not use oxygen because he cannot afford it.   The history is provided by the patient. No language interpreter was used.    Past Medical History:  Diagnosis Date  . Anemia   . Anxiety   . Asthma   . Atrial fibrillation ([place]) 01/2012   with RVR  . Bipolar 1 disorder ([place])   . Cancer ([place])   . Collagen vascular disease ([place])   . Conversion disorder 06/2010  . COPD (chronic obstructive pulmonary disease) ([place])   . Dermatomyositis ([place])   . Dermatomyositis ([place])   . Diverticulitis   . Diverticulosis of colon   . Dysrhythmia    "irregular" (11/15/2012)  . Esophageal dysmotility   . Esophageal stricture   . Fibromyalgia   . Gastritis   . GERD (gastroesophageal reflux disease)   . Headache(784.0)    "severe; get them often" (11/15/2012)  . Hiatal hernia   . History of narcotic addiction ([place])   . Hx of adenomatous colonic polyps   . Hyperlipidemia   . Hypertension   . Internal hemorrhoids   . Ischemic heart disease   . Major depression    with acute psychotic break in 06/2010  . Myocardial infarct ([place])    mulitple (1999, 2000, 2004)  . Narcotic dependence ([place])   . Nephrolithiasis   . Obesity   . OCD  (obsessive compulsive disorder)   . Otosclerosis   . Paroxysmal A-fib ([place])   . Peripheral neuropathy   . Raynaud's disease   . Renal insufficiency   . Rheumatoid arthritis(714.0)   . Sarcoidosis   . Seizures ([place])   . Subarachnoid hemorrhage ([place]) 01/2012   with subdural  hematoma.   . Type II diabetes mellitus ([place])   . Urge incontinence   . Vertigo     Patient Active Problem List   Diagnosis Date Noted  . Low oxygen saturation   . Pain in right wrist 02/27/2016  . Altered mental state 11/14/2015  . Severe dehydration 11/14/2015  . Hypernatremia 11/14/2015  . Difficulty in walking, not elsewhere classified   . Right wrist fracture   . Closed displaced fracture of ulnar styloid with routine healing   . Scapholunate ligament injury with no instability   . Falls frequently   . Hypoxia 11/02/2015  . Anxiety 06/06/2015  . Impaired mobility 04/28/2015  . Lumbar spondylosis with myelopathy 03/07/2015  . Nocturia 12/03/2014  . Chronic venous insufficiency 09/23/2014  . Poor social situation 07/30/2014  . Schizophrenia, acute ([place])   . Schizoaffective disorder, unspecified type ([place])   . Decreased visual acuity 06/27/2014  . COPD exacerbation ([place])   .  Right sided weakness 01/04/2014  . Essential hypertension, benign   . Atrial fibrillation, unspecified   . Radiculopathy of cervical region   . Polyuria 12/07/2013  . Erectile dysfunction 12/07/2013  . Diverticulitis 08/01/2013  . Acute diverticulitis 08/01/2013  . Hematochezia 06/04/2013  . Hypokalemia 10/03/2012  . Benign neoplasm of colon 09/27/2012  . Stricture and stenosis of esophagus 09/27/2012  . Diverticulitis of colon without hemorrhage 07/26/2012  . Polypharmacy 03/26/2012  . Seizures ([place]) 02/10/2012  . Atrial fibrillation ([place]) 02/05/2012  . Coronary atherosclerosis of native coronary artery 02/05/2012  . Radicular low back pain 03/09/2011  . History of seizure disorder 01/29/2011  . Irritable bowel  syndrome (IBS) 06/24/2010  . Urge incontinence 06/19/2010  . TOBACCO USER 11/23/2008  . DEPRESSION, MAJOR 05/31/2008  . ISCHEMIC HEART DISEASE 05/31/2008  . RAYNAUD'S DISEASE 05/31/2008  . HIATAL HERNIA 05/31/2008  . Rheumatoid arthritis ([place]) 05/31/2008  . FIBROMYALGIA 05/31/2008  . Hyperlipidemia 01/26/2008  . Anxiety state 01/26/2008  . HEMORRHOIDS, INTERNAL 01/26/2008  . COPD (chronic obstructive pulmonary disease) ([place]) 01/26/2008  . ESOPHAGEAL MOTILITY DISORDER 01/26/2008  . GERD 01/26/2008  . Dermatomyositis ([place]) 01/26/2008    Past Surgical History:  Procedure Laterality Date  . BACK SURGERY    . CARDIAC CATHETERIZATION    . CARPAL TUNNEL RELEASE Bilateral   . CATARACT EXTRACTION W/ INTRAOCULAR LENS IMPLANT Left   . COLONOSCOPY N/A 09/27/2012   Procedure: COLONOSCOPY;  Surgeon: Lafayette Dragon, MD;  Location: [facility] ENDOSCOPY;  Service: Endoscopy;  Laterality: N/A;  . ESOPHAGOGASTRODUODENOSCOPY N/A 09/27/2012   Procedure: ESOPHAGOGASTRODUODENOSCOPY (EGD);  Surgeon: Lafayette Dragon, MD;  Location: [facility];  Service: Endoscopy;  Laterality: N/A;  . KNEE ARTHROSCOPY W/ MENISCAL REPAIR Left 2009  . LUMBAR DISC SURGERY    . LYMPH NODE DISSECTION Right 1970's   "neck; dr thought I had Hodgkins; turned out to be sarcoidosis" (11/15/2012)  . squamous papilloma   2010   removed by Dr. Constance Holster ENT, noted on tongue  . TONSILLECTOMY         Home Medications    Prior to Admission medications   Medication Sig Start Date End Date Taking? Authorizing Provider  ALPRAZolam Duanne Moron) 1 MG tablet Take 1 mg by mouth 3 (three) times daily.    [provider]  amiodarone (PACERONE) 200 MG tablet Take 0.5 tablets (100 mg total) by mouth daily. 10/17/15   [name], MD  amLODipine (NORVASC) 5 MG tablet Take 1 tablet (5 mg total) by mouth daily. 11/05/15   Nashua Bing, DO  aspirin EC 81 MG tablet Take 1 tablet (81 mg total) by mouth daily. 10/17/15   [name], MD  atorvastatin  (LIPITOR) 40 MG tablet Take 1 tablet (40 mg total) by mouth daily. Patient taking differently: Take 40 mg by mouth at bedtime.  10/17/15   [name], MD  budesonide-formoterol (SYMBICORT) 160-4.5 MCG/ACT inhaler Inhale 2 puffs into the lungs 2 (two) times daily. 11/22/16   Doreatha Lew, MD  divalproex (DEPAKOTE ER) 250 MG 24 hr tablet Take 3 tablets (750 mg total) by mouth daily. 12/15/15   [name], MD  esomeprazole (NEXIUM) 40 MG capsule Take 40 mg by mouth daily. 07/26/16   [provider]  ipratropium-albuterol (DUONEB) 0.5-2.5 (3) MG/3ML SOLN Inhale 3 mLs into the lungs every 6 (six) hours as needed. Patient taking differently: Inhale 3 mLs into the lungs every 6 (six) hours as needed (for breathing).  11/17/15   Elgergawy,  Silver Huguenin, MD  levETIRAcetam (KEPPRA) 750 MG tablet Take 1 tablet (750 mg total) by mouth 2 (two) times daily. TAKE 1 TABLET(500 MG) BY MOUTH TWICE DAILY 11/22/16   Patrecia Pour, Christean Grief, MD  loperamide (IMODIUM) 2 MG capsule TAKE 1 TO 2 CAPSULES BY MOUTH 3 TIMES A DAY AS NEEDED FOR DIARRHEA Patient taking differently: Take 2 mg by mouth every 8 (eight) hours as needed for diarrhea or loose stools.  08/20/15   [name], MD  Multiple Vitamin (MULTIVITAMIN WITH MINERALS) TABS tablet Take 1 tablet by mouth daily. 10/17/15   [name], MD  oxybutynin (DITROPAN) 5 MG tablet Take 5 mg by mouth daily. 07/26/16   [provider]  Potassium Chloride ER 20 MEQ TBCR Take 1 tablet by mouth every other day.  11/29/14   [provider]  predniSONE (DELTASONE) 10 MG tablet Take 4 tablets for 3 days; Take 3 tablets for 4 days; Take 2 tablets for 3 days; Take 1 tablet for 4 days 11/22/16   Patrecia Pour, Christean Grief, MD  risperiDONE (RISPERDAL) 1 MG tablet Take 1 tablet (1 mg total) by mouth 2 (two) times daily. 12/15/15   [name], MD    Family History Family History  Problem Relation Age of Onset  . Alcohol abuse Mother   . Depression Mother   .  Heart disease Mother   . Diabetes Mother   . Stroke Mother   . Coronary artery disease Father   . Diabetes Other        1/2 brother  . Hepatitis Brother     Social History Social History  Substance Use Topics  . Smoking status: Former Smoker    Packs/day: 0.50    Years: 30.00    Types: Cigarettes    Quit date: 12/15/2014  . Smokeless tobacco: Never Used     Comment: 3 cigs a day  . Alcohol use No     Comment: Alcohol stopped in September of 2014     Allergies   Immune globulins; Ciprofloxacin; Flagyl [metronidazole]; Lisinopril; Sulfa antibiotics; Requip [ropinirole hcl]; Betamethasone dipropionate; Bupropion hcl; Clobetasol; Codeine; Escitalopram oxalate; Fluoxetine hcl; Furosemide; Meclizine; Paroxetine; Penicillins; Tacrolimus; Tetanus toxoid; and Tuberculin purified protein derivative   Review of Systems Review of Systems  All other systems reviewed and are negative.    Physical Exam Updated Vital Signs BP 128/71   Pulse 77   Temp 98.6 F (37 C) (Oral)   Resp 20   Ht 5\' 7"  (1.702 m)   Wt 102.1 kg (225 lb)   SpO2 94%   BMI 35.24 kg/m   Physical Exam  Constitutional: He is oriented to person, place, and time. He appears well-developed and well-nourished.  HENT:  Head: Normocephalic and atraumatic.  Eyes: Pupils are equal, round, and reactive to light. Conjunctivae and EOM are normal. Right eye exhibits no discharge. Left eye exhibits no discharge. No scleral icterus.  Neck: Normal range of motion. Neck supple. No JVD present.  Cardiovascular: Normal rate, regular rhythm and normal heart sounds.  Exam reveals no gallop and no friction rub.   No murmur heard. Pulmonary/Chest: Effort normal and breath sounds normal. No respiratory distress. He has no wheezes. He has no rales. He exhibits no tenderness.  Abdominal: Soft. He exhibits no distension and no mass. There is no tenderness. There is no rebound and no guarding.  Musculoskeletal: Normal range of motion.  He exhibits no edema or tenderness.  Neurological: He is alert and oriented to  person, place, and time.  Skin: Skin is warm and dry.  Psychiatric: He has a normal mood and affect. His behavior is normal. Judgment and thought content normal.  Nursing note and vitals reviewed.    ED Treatments / Results  Labs (all labs ordered are listed, but only abnormal results are displayed) Labs Reviewed  CBC WITH DIFFERENTIAL/PLATELET  BASIC METABOLIC PANEL    EKG  EKG Interpretation None       Radiology No results found.  Procedures Procedures (including critical care time)  Medications Ordered in ED Medications - No data to display   Initial Impression / Assessment and Plan / ED Course  I have reviewed the triage vital signs and the nursing notes.  Pertinent labs & imaging results that were available during my care of the patient were reviewed by me and considered in my medical decision making (see chart for details).     Patient with mechanical fall.  Patient has repeated falls.  Imaging is negative.  Notably, the patient has an O2 sat that is 84-86% on RA continuously, despite multiple breathing treatments.  He was admitted for the same about a month ago.  He states that his oxygen tends to run low, but that he can't afford oxygen.    I discussed the case with Dr. Alcario Drought, who states that patient should qualify for home O2 given that he maintains a sat less than 88%.  He advises case management consult in the AM for home O2, but if they are unable to assist, then advises admission.    Patient has no symptoms of SOB, cough, fever.  He currently has no acute complaints and is asking to be discharged.  He is agreeable with waiting to see if he can get home O2.  Patient signed out to morning team, who will follow-up with CM regarding home O2.  Patient will need admission for hypoxia if unable to obtain access to home O2.  Final Clinical Impressions(s) / ED Diagnoses   Final  diagnoses:  None    New Prescriptions New Prescriptions   No medications on file     Montine Circle, PA-C 12/17/16 0522    Ward, Delice Bison, DO 12/17/16 0530

## 2016-12-17 NOTE — ED Notes (Signed)
Admitting MD at bedside.

## 2016-12-17 NOTE — ED Notes (Signed)
Patient transported to CT 

## 2016-12-17 NOTE — ED Notes (Signed)
Patient transported to X-ray 

## 2016-12-17 NOTE — ED Notes (Signed)
ED Provider at bedside. 

## 2016-12-17 NOTE — ED Provider Notes (Signed)
Pt seen and evaluated. No injuries from fall. Pt hypoxic at 87%. Refuses admission or further workup. States this has happened before. Previously on O2 at home. Clear lungs. Neg D-dimer.   Pt with mental capacity and capacity currently to understand risk and benefit of admit, treatment, DC with out O2. Includes death and disability. He clearly expresses understanding of this to me.  Pt leaving AMA. Sees his Dr. In 3 days. Asked to discuss O2 restart.   Tanna Furry, MD 12/17/16 1003

## 2016-12-17 NOTE — ED Notes (Signed)
Patient placed on bedpan.

## 2016-12-17 NOTE — ED Notes (Addendum)
EDP at bedside  

## 2016-12-17 NOTE — ED Notes (Signed)
Patient refused to take off wet pants. Patient eating breakfast.

## 2016-12-17 NOTE — Plan of Care (Signed)
66 yo M with COPD history.  Presents for fall, no major injury in fall.  During ED work up he is found to be hypoxic although asymptomatic with regards to this.  This is the second time this month with identical findings (asymptomatic low O2 sat at rest).  Assessment: its pretty clear from notes that patient has resting pulse ox of 85-86% at baseline, asymptomatic with this, and just needs chronic home O2.  Not currently having COPD exacerbation at this time.  Does not use oxygen at home currently "because he cannot afford it".  Per discussion with EDP: plan is to call CM when they get in this morning, and see if they can get home O2 set up through medicare and spare the patient an admission.  If not then call triad back for admission.

## 2016-12-21 IMAGING — CR DG SHOULDER 2+V*L*
3 series · 3 of 3 positions shown · non-contrast
Comparison: None.

CLINICAL DATA: Fall out of a chair, striking head on a wall. Left
shoulder pain.

EXAM:
LEFT SHOULDER - 2+ VIEW

[x shoulder ap left]
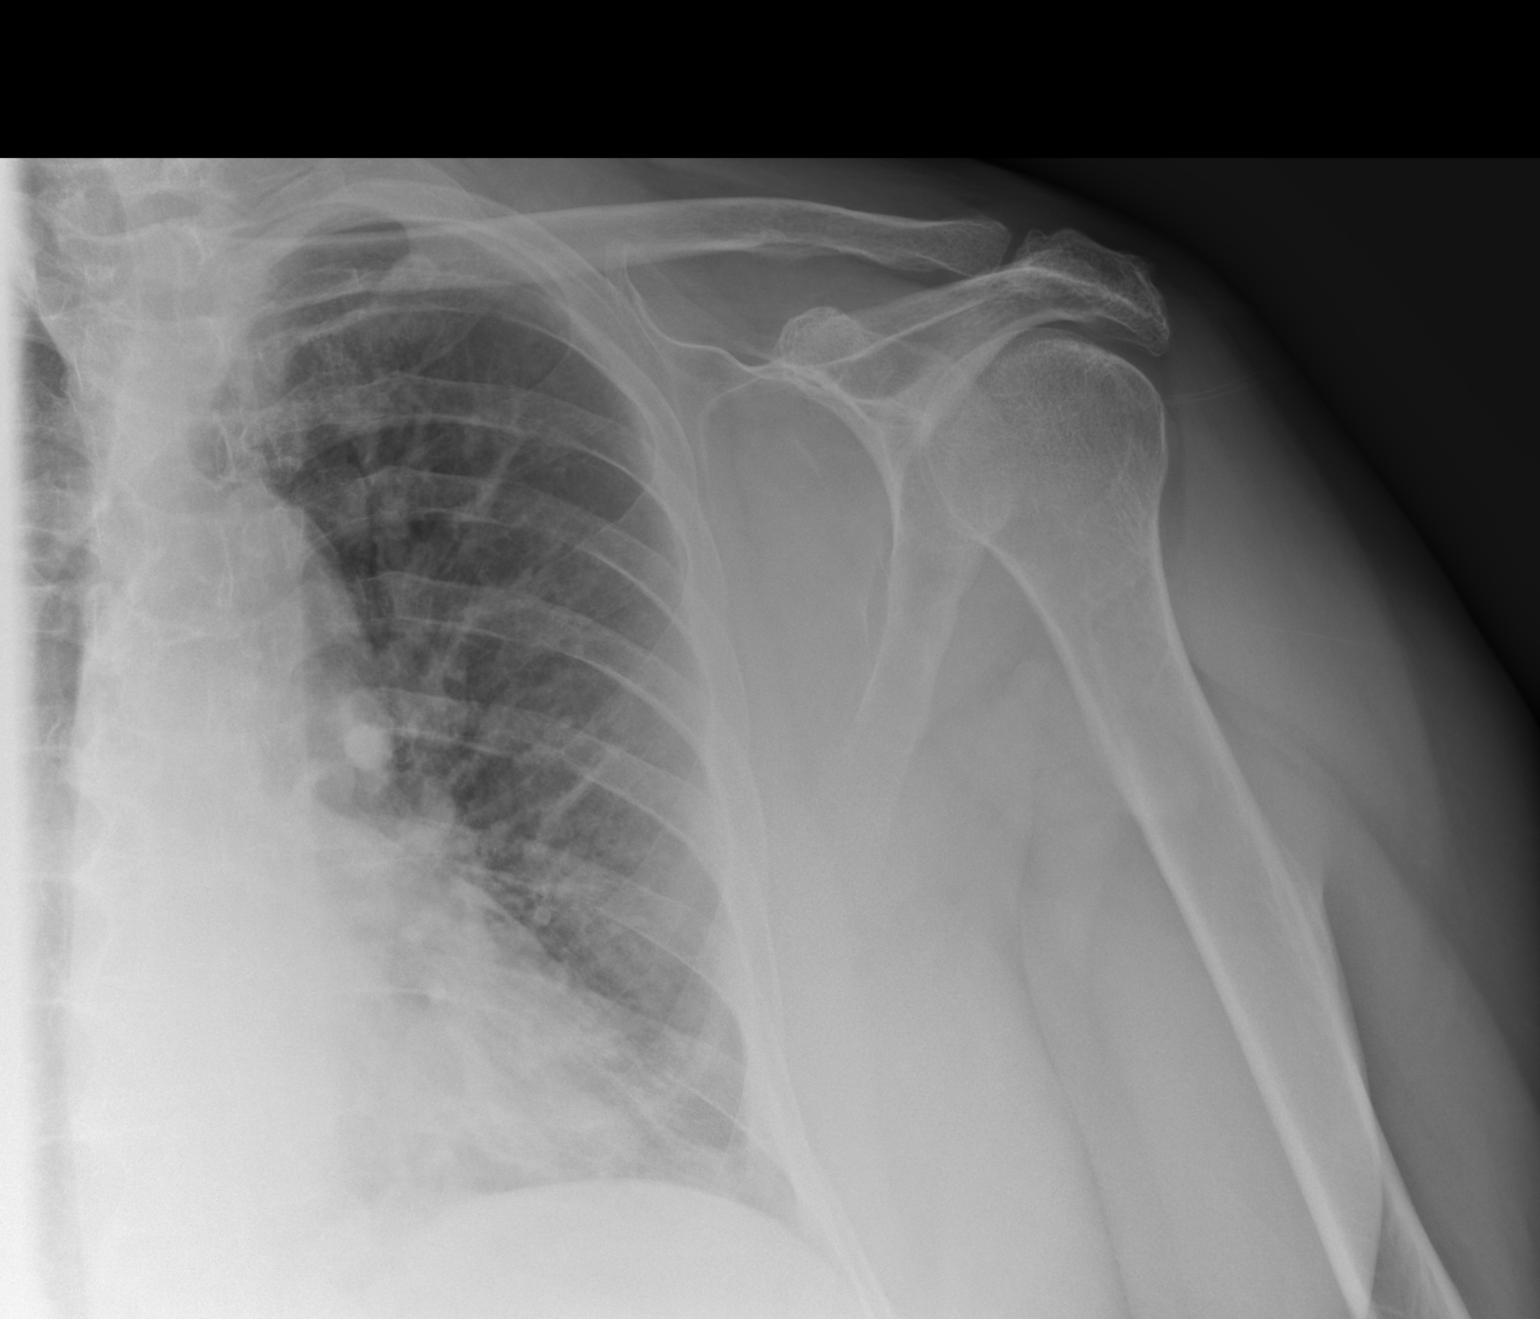

[x shoulder axillary left]
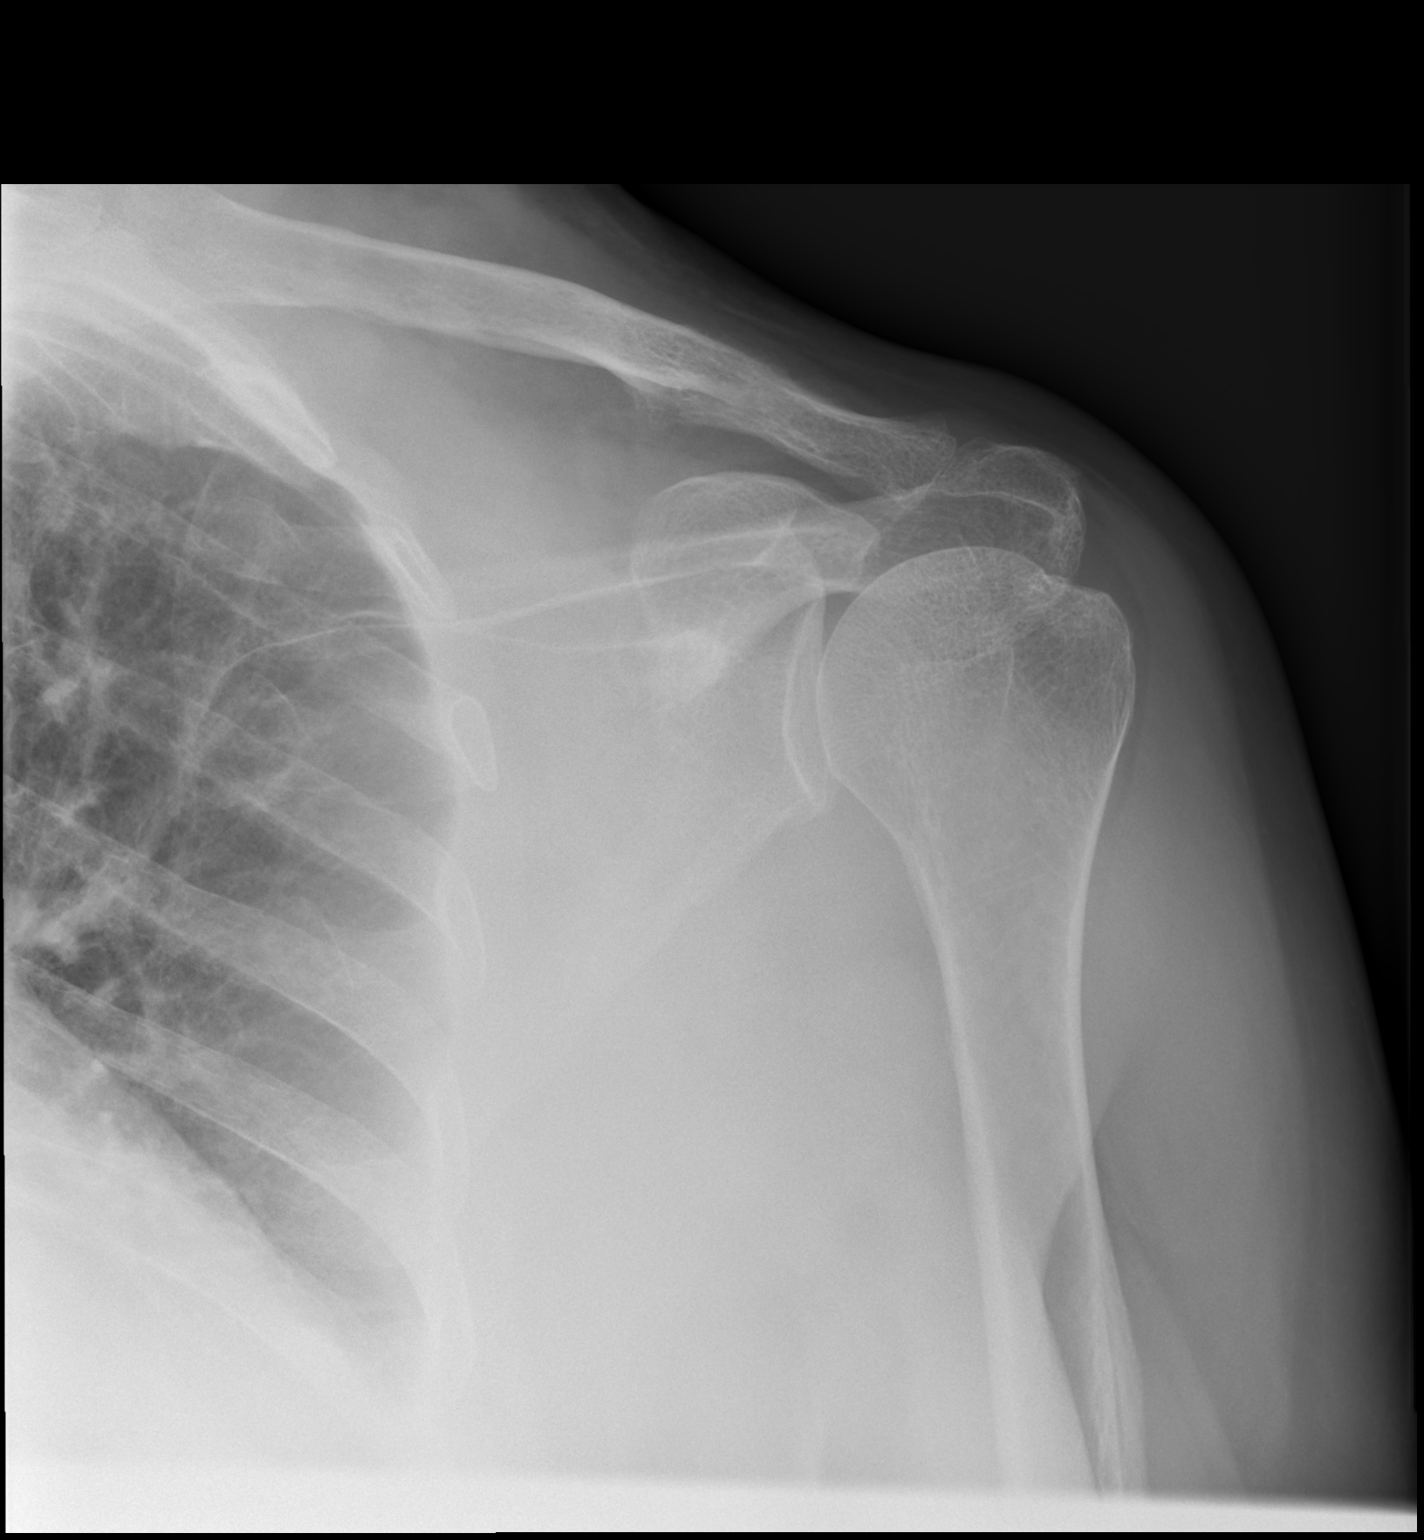

[x scapula y-view left]
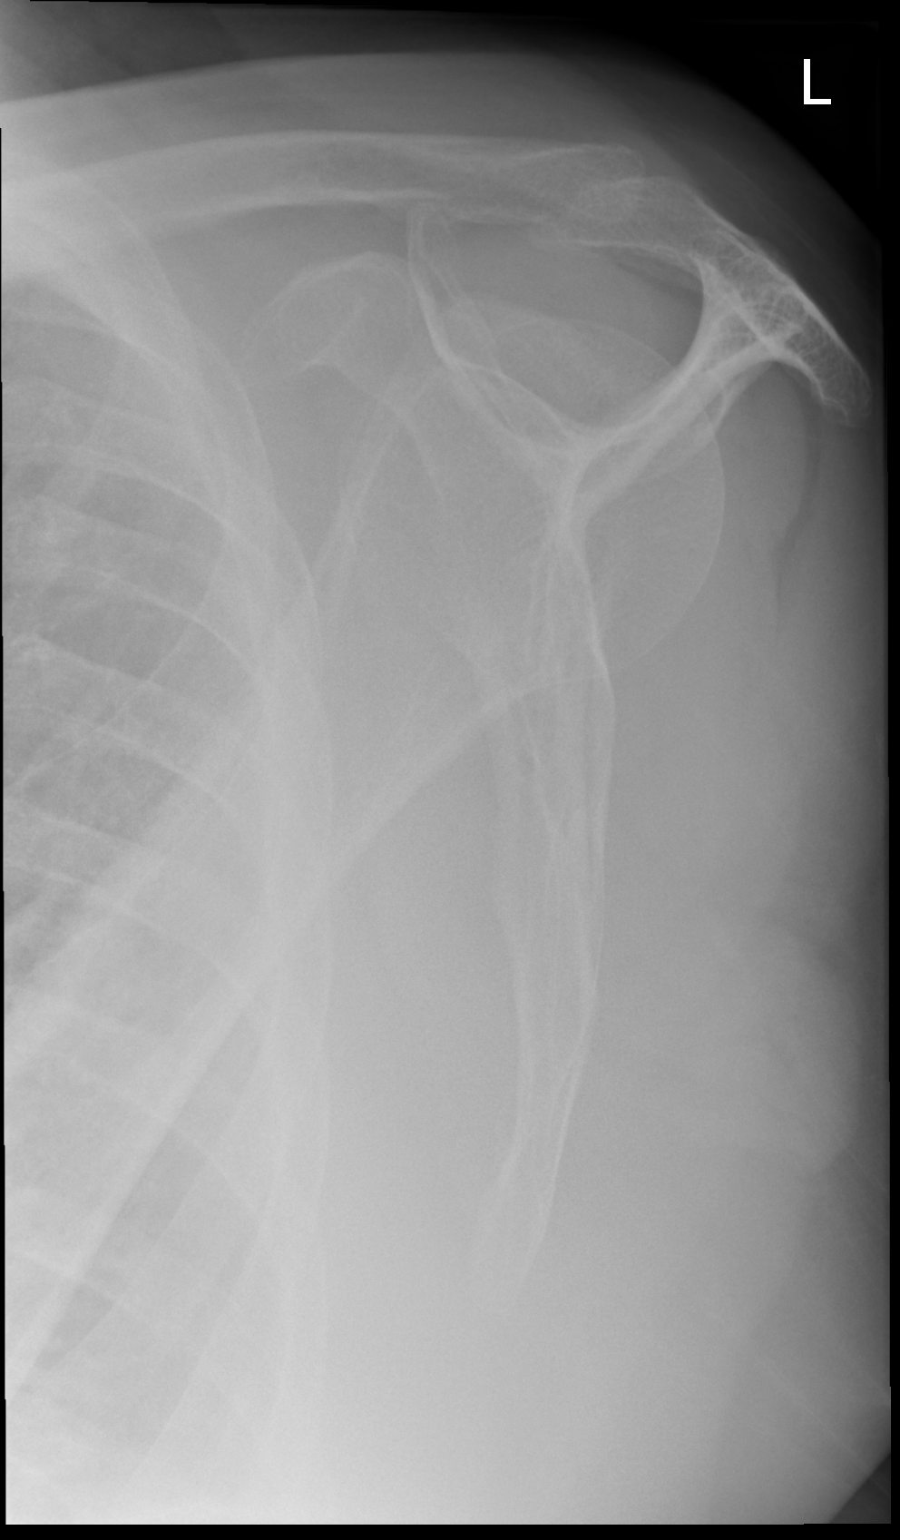

[3 of 3 positions shown; findings below may reference images not displayed]

FINDINGS: Laterally downsloping acromion may predispose to impingement. AC
joint intact. No glenohumeral malalignment. No shoulder fracture
identified.
IMPRESSION: 1. No acute findings.
2. Laterally downsloping acromion may predispose to impingement.
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
## 2016-12-27 ENCOUNTER — Emergency Department (HOSPITAL_COMMUNITY): Payer: Medicare Other

## 2016-12-27 ENCOUNTER — Encounter (HOSPITAL_COMMUNITY): Payer: Self-pay | Admitting: Emergency Medicine

## 2016-12-27 ENCOUNTER — Other Ambulatory Visit: Payer: Self-pay

## 2016-12-27 ENCOUNTER — Emergency Department (HOSPITAL_COMMUNITY)
Admission: EM | Admit: 2016-12-27 | Discharge: 2016-12-27 | Disposition: A | Discharge status: Home / Self Care | Payer: Medicare Other | Attending: Emergency Medicine | Admitting: Emergency Medicine

## 2016-12-27 DIAGNOSIS — J45909 Unspecified asthma, uncomplicated: Secondary | ICD-10-CM | POA: Insufficient documentation

## 2016-12-27 DIAGNOSIS — E119 Type 2 diabetes mellitus without complications: Secondary | ICD-10-CM | POA: Diagnosis not present

## 2016-12-27 DIAGNOSIS — S62231A Other displaced fracture of base of first metacarpal bone, right hand, initial encounter for closed fracture: Secondary | ICD-10-CM | POA: Diagnosis not present

## 2016-12-27 DIAGNOSIS — Z87891 Personal history of nicotine dependence: Secondary | ICD-10-CM | POA: Insufficient documentation

## 2016-12-27 DIAGNOSIS — Y929 Unspecified place or not applicable: Secondary | ICD-10-CM | POA: Insufficient documentation

## 2016-12-27 DIAGNOSIS — S6991XA Unspecified injury of right wrist, hand and finger(s), initial encounter: Secondary | ICD-10-CM | POA: Diagnosis present

## 2016-12-27 DIAGNOSIS — I252 Old myocardial infarction: Secondary | ICD-10-CM | POA: Diagnosis not present

## 2016-12-27 DIAGNOSIS — I10 Essential (primary) hypertension: Secondary | ICD-10-CM | POA: Insufficient documentation

## 2016-12-27 DIAGNOSIS — W0110XA Fall on same level from slipping, tripping and stumbling with subsequent striking against unspecified object, initial encounter: Secondary | ICD-10-CM | POA: Insufficient documentation

## 2016-12-27 DIAGNOSIS — M25331 Other instability, right wrist: Secondary | ICD-10-CM | POA: Diagnosis not present

## 2016-12-27 DIAGNOSIS — Y939 Activity, unspecified: Secondary | ICD-10-CM | POA: Insufficient documentation

## 2016-12-27 DIAGNOSIS — W19XXXA Unspecified fall, initial encounter: Secondary | ICD-10-CM | POA: Diagnosis not present

## 2016-12-27 DIAGNOSIS — J449 Chronic obstructive pulmonary disease, unspecified: Secondary | ICD-10-CM | POA: Diagnosis not present

## 2016-12-27 DIAGNOSIS — Y998 Other external cause status: Secondary | ICD-10-CM | POA: Diagnosis not present

## 2016-12-27 MED ORDER — OXYCODONE HCL 5 MG PO TABS: 5.0000 mg | ORAL_TABLET | ORAL | 0 refills | Status: DC | PRN

## 2016-12-27 MED ORDER — ACETAMINOPHEN 500 MG PO TABS
1000.0000 mg | ORAL_TABLET | Freq: Once | ORAL | Status: AC
  Administered 2016-12-27: 1000 mg via ORAL
  Filled 2016-12-27: qty 2

## 2016-12-27 MED ORDER — OXYCODONE HCL 5 MG PO TABS
5.0000 mg | ORAL_TABLET | Freq: Once | ORAL | Status: AC
  Administered 2016-12-27: 5 mg via ORAL
  Filled 2016-12-27: qty 1
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
## 2016-12-27 NOTE — ED Provider Notes (Signed)
[facility] DEPT Provider Note   CSN: [id number] Arrival date & time: 12/27/16  0700     History   Chief Complaint Chief Complaint  Patient presents with  . Hand Pain    HPI Patrick Holt is a 66 y.o. male.  HPI   66 year old male with history of COPD, atrial fibrillation, bipolar, collagen vascular disease, narcotic addiction, hypertension, hyperlipidemia, MI, DM, Sarcoidosis, seizures, dermatomyositis, muscular dystrophy requiring wheelchair, walker or cane, who presents with concern for hand pain after a fall.  Reports history of frequent falls due to muscular dystrophy, and today was walking with his cane when he lost balance and fell. Reports he fell on his outstretched hand and also hit his nose and face. Denies other head trauma, headache, LOC, nausea, vomiting or other injuries. Reports chronic back pain is unchanged.  Denies new numbness or weakness. Reports episode of flank pain yesterday that resolved with ibuprofen. No other new chest pain, dyspnea or other symptoms today.  Reports he believes he also broke his hand 1-2 weeks ago with a fall, then reports he was seen and diagnosed with wrist fracture about a week ago at [facility].  Today he believes he broke his thumb. Has 10/10 pain at base of thumb, trouble moving hand due to pain.  Chart review shows patient had wrist XR 10/6 which showed deformity from previously identified fractures without acute fracture.  Past Medical History:  Diagnosis Date  . Anemia   . Anxiety   . Asthma   . Atrial fibrillation ([place]) 01/2012   with RVR  . Bipolar 1 disorder ([place])   . Cancer ([place])   . Collagen vascular disease ([place])   . Conversion disorder 06/2010  . COPD (chronic obstructive pulmonary disease) ([place])   . Dermatomyositis ([place])   . Dermatomyositis ([place])   . Diverticulitis   . Diverticulosis of colon   . Dysrhythmia    "irregular" (11/15/2012)  . Esophageal dysmotility   . Esophageal  stricture   . Fibromyalgia   . Gastritis   . GERD (gastroesophageal reflux disease)   . Headache(784.0)    "severe; get them often" (11/15/2012)  . Hiatal hernia   . History of narcotic addiction ([place])   . Hx of adenomatous colonic polyps   . Hyperlipidemia   . Hypertension   . Internal hemorrhoids   . Ischemic heart disease   . Major depression    with acute psychotic break in 06/2010  . Myocardial infarct ([place])    mulitple (1999, 2000, 2004)  . Narcotic dependence ([place])   . Nephrolithiasis   . Obesity   . OCD (obsessive compulsive disorder)   . Otosclerosis   . Paroxysmal A-fib ([place])   . Peripheral neuropathy   . Raynaud's disease   . Renal insufficiency   . Rheumatoid arthritis(714.0)   . Sarcoidosis   . Seizures ([place])   . Subarachnoid hemorrhage ([place]) 01/2012   with subdural  hematoma.   . Type II diabetes mellitus ([place])   . Urge incontinence   . Vertigo     Patient Active Problem List   Diagnosis Date Noted  . Low oxygen saturation   . Pain in right wrist 02/27/2016  . Altered mental state 11/14/2015  . Severe dehydration 11/14/2015  . Hypernatremia 11/14/2015  . Difficulty in walking, not elsewhere classified   . Right wrist fracture   . Closed displaced fracture of ulnar styloid with routine healing   . Scapholunate ligament injury with no instability   .  Falls frequently   . Hypoxia 11/02/2015  . Anxiety 06/06/2015  . Impaired mobility 04/28/2015  . Lumbar spondylosis with myelopathy 03/07/2015  . Nocturia 12/03/2014  . Chronic venous insufficiency 09/23/2014  . Poor social situation 07/30/2014  . Schizophrenia, acute ([place])   . Schizoaffective disorder, unspecified type ([place])   . Decreased visual acuity 06/27/2014  . COPD exacerbation ([place])   . Right sided weakness 01/04/2014  . Essential hypertension, benign   . Atrial fibrillation, unspecified   . Radiculopathy of cervical region   . Polyuria 12/07/2013  . Erectile dysfunction 12/07/2013  .  Diverticulitis 08/01/2013  . Acute diverticulitis 08/01/2013  . Hematochezia 06/04/2013  . Hypokalemia 10/03/2012  . Benign neoplasm of colon 09/27/2012  . Stricture and stenosis of esophagus 09/27/2012  . Diverticulitis of colon without hemorrhage 07/26/2012  . Polypharmacy 03/26/2012  . Seizures ([place]) 02/10/2012  . Atrial fibrillation ([place]) 02/05/2012  . Coronary atherosclerosis of native coronary artery 02/05/2012  . Radicular low back pain 03/09/2011  . History of seizure disorder 01/29/2011  . Irritable bowel syndrome (IBS) 06/24/2010  . Urge incontinence 06/19/2010  . TOBACCO USER 11/23/2008  . DEPRESSION, MAJOR 05/31/2008  . ISCHEMIC HEART DISEASE 05/31/2008  . RAYNAUD'S DISEASE 05/31/2008  . HIATAL HERNIA 05/31/2008  . Rheumatoid arthritis ([place]) 05/31/2008  . FIBROMYALGIA 05/31/2008  . Hyperlipidemia 01/26/2008  . Anxiety state 01/26/2008  . HEMORRHOIDS, INTERNAL 01/26/2008  . COPD (chronic obstructive pulmonary disease) ([place]) 01/26/2008  . ESOPHAGEAL MOTILITY DISORDER 01/26/2008  . GERD 01/26/2008  . Dermatomyositis ([place]) 01/26/2008    Past Surgical History:  Procedure Laterality Date  . BACK SURGERY    . CARDIAC CATHETERIZATION    . CARPAL TUNNEL RELEASE Bilateral   . CATARACT EXTRACTION W/ INTRAOCULAR LENS IMPLANT Left   . KNEE ARTHROSCOPY W/ MENISCAL REPAIR Left 2009  . LUMBAR DISC SURGERY    . LYMPH NODE DISSECTION Right 1970's   "neck; dr thought I had Hodgkins; turned out to be sarcoidosis" (11/15/2012)  . squamous papilloma   2010   removed by Dr. Constance Holster ENT, noted on tongue  . TONSILLECTOMY         Home Medications    Prior to Admission medications   Medication Sig Start Date End Date Taking? Authorizing Provider  ALPRAZolam Duanne Moron) 1 MG tablet Take 1 mg by mouth 3 (three) times daily.    [provider]  amiodarone (PACERONE) 200 MG tablet Take 0.5 tablets (100 mg total) by mouth daily. 10/17/15   [name], MD  amLODipine (NORVASC) 5  MG tablet Take 1 tablet (5 mg total) by mouth daily. 11/05/15   Melvin Bing, DO  aspirin EC 81 MG tablet Take 1 tablet (81 mg total) by mouth daily. 10/17/15   [name], MD  atorvastatin (LIPITOR) 40 MG tablet Take 1 tablet (40 mg total) by mouth daily. Patient taking differently: Take 40 mg by mouth at bedtime.  10/17/15   [name], MD  budesonide-formoterol (SYMBICORT) 160-4.5 MCG/ACT inhaler Inhale 2 puffs into the lungs 2 (two) times daily. 11/22/16   Doreatha Lew, MD  divalproex (DEPAKOTE ER) 250 MG 24 hr tablet Take 3 tablets (750 mg total) by mouth daily. 12/15/15   [name], MD  esomeprazole (NEXIUM) 40 MG capsule Take 40 mg by mouth daily. 07/26/16   [provider]  ipratropium-albuterol (DUONEB) 0.5-2.5 (3) MG/3ML SOLN Inhale 3 mLs into the lungs every 6 (six) hours as needed. Patient taking differently: Inhale 3 mLs  into the lungs every 6 (six) hours as needed (for breathing).  11/17/15   Elgergawy, Silver Huguenin, MD  levETIRAcetam (KEPPRA) 750 MG tablet Take 1 tablet (750 mg total) by mouth 2 (two) times daily. TAKE 1 TABLET(500 MG) BY MOUTH TWICE DAILY 11/22/16   Patrecia Pour, Christean Grief, MD  loperamide (IMODIUM) 2 MG capsule TAKE 1 TO 2 CAPSULES BY MOUTH 3 TIMES A DAY AS NEEDED FOR DIARRHEA Patient taking differently: Take 2 mg by mouth every 8 (eight) hours as needed for diarrhea or loose stools.  08/20/15   [name], MD  Multiple Vitamin (MULTIVITAMIN WITH MINERALS) TABS tablet Take 1 tablet by mouth daily. 10/17/15   [name], MD  oxybutynin (DITROPAN) 5 MG tablet Take 5 mg by mouth daily. 07/26/16   [provider]  Potassium Chloride ER 20 MEQ TBCR Take 1 tablet by mouth every other day.  11/29/14   [provider]  predniSONE (DELTASONE) 10 MG tablet Take 4 tablets for 3 days; Take 3 tablets for 4 days; Take 2 tablets for 3 days; Take 1 tablet for 4 days Patient not taking: Reported on 12/17/2016 11/22/16   Patrecia Pour, Christean Grief, MD    risperiDONE (RISPERDAL) 1 MG tablet Take 1 tablet (1 mg total) by mouth 2 (two) times daily. 12/15/15   [name], MD    Family History Family History  Problem Relation Age of Onset  . Alcohol abuse Mother   . Depression Mother   . Heart disease Mother   . Diabetes Mother   . Stroke Mother   . Coronary artery disease Father   . Diabetes Other        1/2 brother  . Hepatitis Brother     Social History Social History   Tobacco Use  . Smoking status: Former Smoker    Packs/day: 0.50    Years: 30.00    Pack years: 15.00    Types: Cigarettes    Last attempt to quit: 12/15/2014    Years since quitting: 2.0  . Smokeless tobacco: Never Used  . Tobacco comment: 3 cigs a day  Substance Use Topics  . Alcohol use: No    Alcohol/week: 0.0 oz    Comment: Alcohol stopped in September of 2014  . Drug use: No     Allergies   Immune globulins; Ciprofloxacin; Flagyl [metronidazole]; Lisinopril; Sulfa antibiotics; Requip [ropinirole hcl]; Betamethasone dipropionate; Bupropion hcl; Clobetasol; Codeine; Escitalopram oxalate; Fluoxetine hcl; Furosemide; Meclizine; Paroxetine; Penicillins; Tacrolimus; Tetanus toxoid; and Tuberculin purified protein derivative   Review of Systems Review of Systems  Constitutional: Negative for fever.  HENT: Negative for sore throat.   Eyes: Negative for visual disturbance.  Respiratory: Negative for cough and shortness of breath.   Cardiovascular: Negative for chest pain.  Gastrointestinal: Negative for abdominal pain, nausea and vomiting.  Genitourinary: Negative for difficulty urinating.  Musculoskeletal: Positive for arthralgias. Negative for back pain and neck stiffness.  Skin: Negative for rash.  Neurological: Negative for syncope and headaches.     Physical Exam Updated Vital Signs BP 121/74 (BP Location: Left Arm)   Pulse 71   Temp 98.1 F (36.7 C) (Oral)   Resp 18   SpO2 92%   Physical Exam  Constitutional: He is oriented to  person, place, and time. He appears well-developed and well-nourished. No distress.  HENT:  Head: Normocephalic and atraumatic.  Mild erythema to nose  Eyes: Conjunctivae and EOM are normal.  Neck: Normal range of motion.  Cardiovascular: Normal rate,  regular rhythm, normal heart sounds and intact distal pulses. Exam reveals no gallop and no friction rub.  No murmur heard. Pulmonary/Chest: Effort normal and breath sounds normal. No respiratory distress. He has no wheezes. He has no rales.  Abdominal: Soft. He exhibits no distension. There is no tenderness. There is no guarding.  Musculoskeletal: He exhibits no edema.       Right wrist: He exhibits decreased range of motion, tenderness and deformity (chronic appearing deformity). He exhibits no laceration.       Right hand: He exhibits decreased range of motion, tenderness and swelling (swelling and contusion right thenar eminence). He exhibits normal capillary refill and no deformity. Normal sensation noted. Decreased sensation is not present in the ulnar distribution, is not present in the medial distribution and is not present in the radial distribution. Decreased strength (reports pain with all movements) noted. He exhibits finger abduction, thumb/finger opposition and wrist extension trouble.  Neurological: He is alert and oriented to person, place, and time.  Skin: Skin is warm and dry. He is not diaphoretic.  Nursing note and vitals reviewed.    ED Treatments / Results  Labs (all labs ordered are listed, but only abnormal results are displayed) Labs Reviewed - No data to display  EKG  EKG Interpretation None       Radiology Dg Wrist Complete Right  Result Date: 12/27/2016 CLINICAL DATA:  Fall, wrist and thumb pain. EXAM: RIGHT WRIST - COMPLETE 3+ VIEW COMPARISON:  11/20/2016 FINDINGS: There is a fracture through the right first distal metacarpal with mild angulation. Old healed distal radial fracture noted. There is widening  of the scapholunate distance, increasing since prior study, now on 7 mm compared to 5 mm previously compatible with scapholunate dissociation. Old ulnar styloid fracture noted. IMPRESSION: Angulated distal right first metacarpal fracture. Scapholunate dissociation, worsening since prior study. Old distal radial and ulnar styloid fractures. Electronically Signed   By: Rolm Baptise M.D.   On: 12/27/2016 08:03   Dg Hand Complete Right  Result Date: 12/27/2016 CLINICAL DATA:  Fall, wrist pain.  Thumb pain. EXAM: RIGHT HAND - COMPLETE 3+ VIEW COMPARISON:  11/16/2015 FINDINGS: There is a fracture through the right first distal metacarpal with mild angulation. Old healed distal radial fracture noted. There is widening of the scapholunate distance, increasing since prior study, now on 7 mm compared to 5 mm previously compatible with scapholunate dissociation. Old ulnar styloid fracture noted. IMPRESSION: Angulated distal right first metacarpal fracture. Scapholunate dissociation, worsening since prior study. Old distal radial and ulnar styloid fractures. Electronically Signed   By: Rolm Baptise M.D.   On: 12/27/2016 08:02    Procedures Procedures (including critical care time)  Medications Ordered in ED Medications  oxyCODONE (Oxy IR/ROXICODONE) immediate release tablet 5 mg (5 mg Oral Given 12/27/16 0746)  acetaminophen (TYLENOL) tablet 1,000 mg (1,000 mg Oral Given 12/27/16 0746)     Initial Impression / Assessment and Plan / ED Course  I have reviewed the triage vital signs and the nursing notes.  Pertinent labs & imaging results that were available during my care of the patient were reviewed by me and considered in my medical decision making (see chart for details).    66 year old male with history of COPD, atrial fibrillation, bipolar, collagen vascular disease, narcotic addiction, hypertension, hyperlipidemia, MI, DM, Sarcoidosis, seizures, dermatomyositis, muscular dystrophy requiring  wheelchair, walker or cane, who presents with concern for hand pain after a fall.  Pain limiting mobility, patient otherwise NV intact.  X-ray  shows worsening of prior known scapholunate dissociation, as well as first metacarpal fracture. Placed in thumb spica, recommend ice, elevation.  Rec tylenol/ibuprofen for pain.  Pt declines oxycodone. Recommend follow up with hand surgery, Dr. Burney Gauze as soon as available.  Final Clinical Impressions(s) / ED Diagnoses   Final diagnoses:  Other closed displaced fracture of base of first metacarpal bone of right hand, initial encounter  Scapholunate dissociation, right, chronic, worsening    ED Discharge Orders        Ordered    oxyCODONE (ROXICODONE) 5 MG immediate release tablet  Every 4 hours PRN,   Status:  Discontinued     12/27/16 0820       Gareth Morgan, MD 12/27/16 0840

## 2016-12-27 NOTE — Discharge Instructions (Signed)
Take Tylenol 1000 mg 4 times a day for 1 week. This is the maximum dose of Tylenol (acetaminophen) you can take from all sources. Please check other over-the-counter medications and prescriptions to ensure you are not taking other medications that contain acetaminophen.  You may also take ibuprofen 400 mg 6 times a day alternating with or at the same time as tylenol.  Take oxycodone as needed for breakthrough pain.  This medication can be addicting, sedating and cause constipation.   °

## 2016-12-27 NOTE — ED Notes (Signed)
Bed: OI75 Expected date:  Expected time:  Means of arrival:  Comments: 66 yo M/Poss hand FX

## 2016-12-30 IMAGING — CT CT HEAD W/O CM
2 series · 15 of 30 positions shown, 19 images · non-contrast
Comparison: 06/28/2014, 06/26/2014

CLINICAL DATA: 64-year-old male with a history of altered mental
status

EXAM:
CT HEAD WITHOUT CONTRAST
TECHNIQUE: Contiguous axial images were obtained from the base of the skull
through the vertex without intravenous contrast.

[Series 301: head w/o, idose (1) · axial · non-contrast · 0.49mm/px · z∈[+54,+204]mm · 13 of 36 slices shown, 17 images]
[im 3/36  brain]
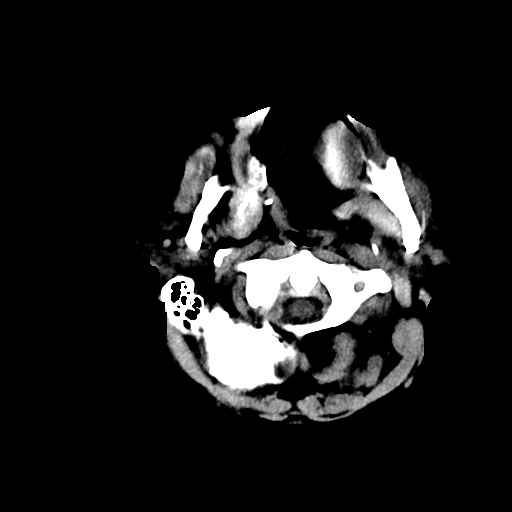
[im 3/36  bone]
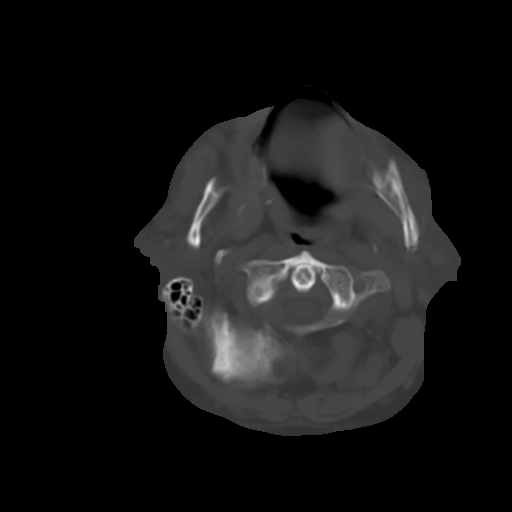
[im 6/36  brain]
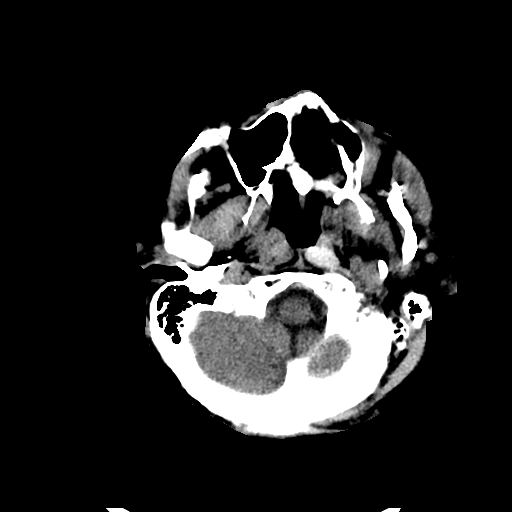
[im 8/36  brain]
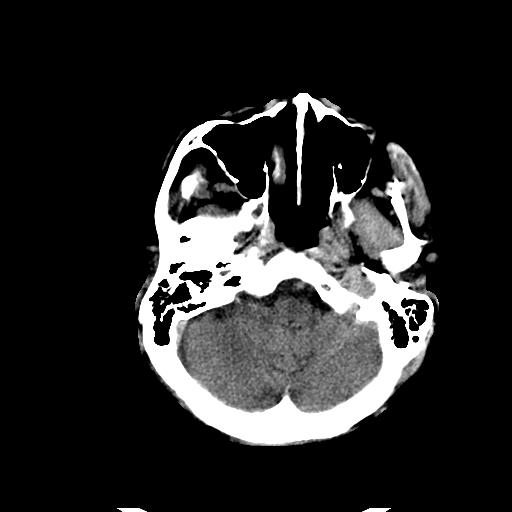
[im 11/36  brain]
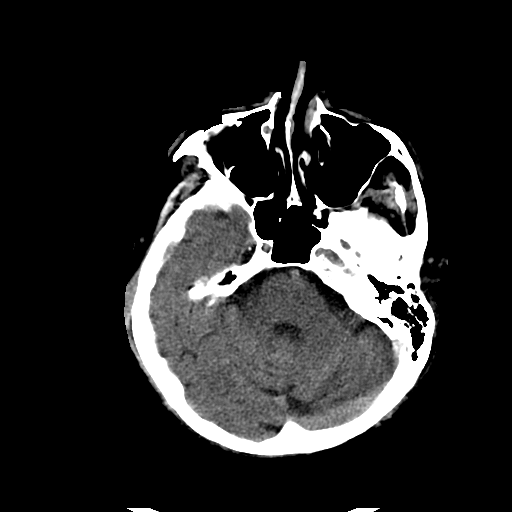
[im 13/36  brain]
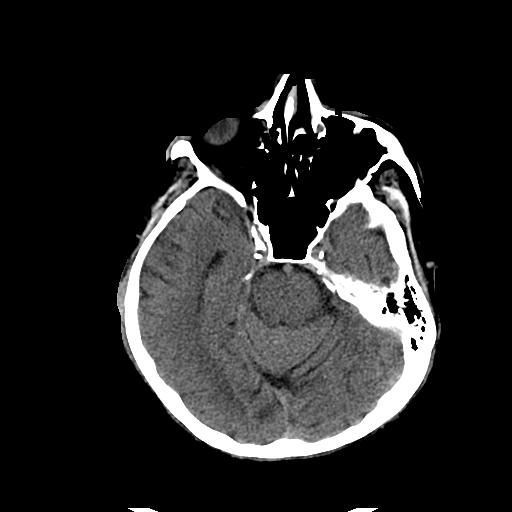
[im 13/36  bone]
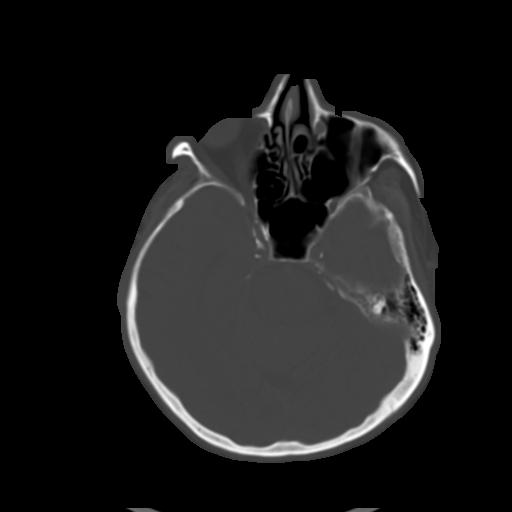
[im 16/36  brain]
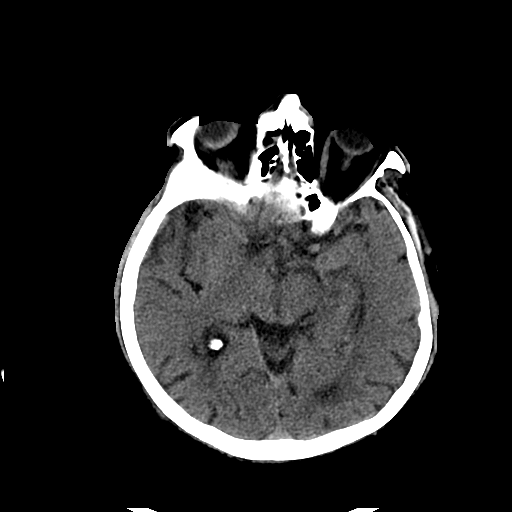
[im 18/36  brain]
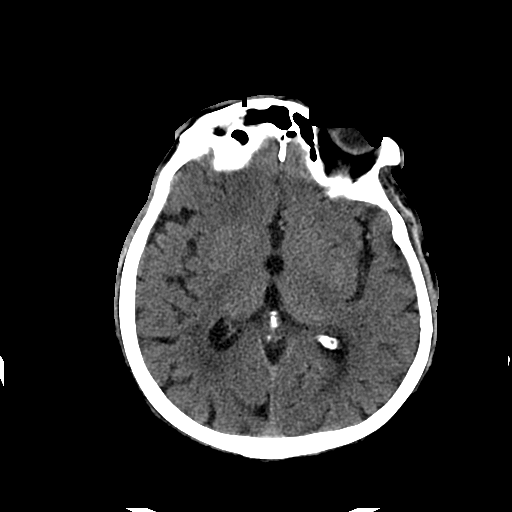
[im 21/36  brain]
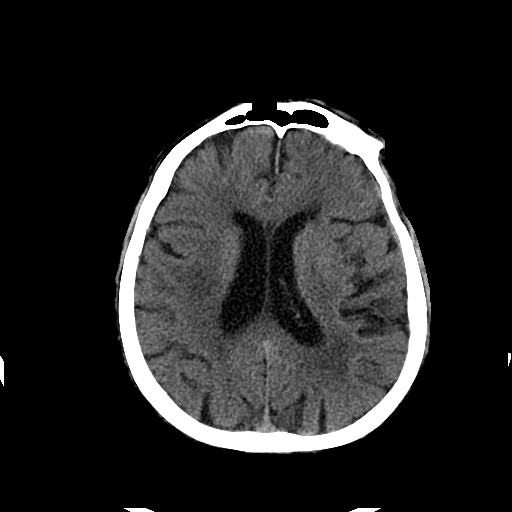
[im 23/36  brain]
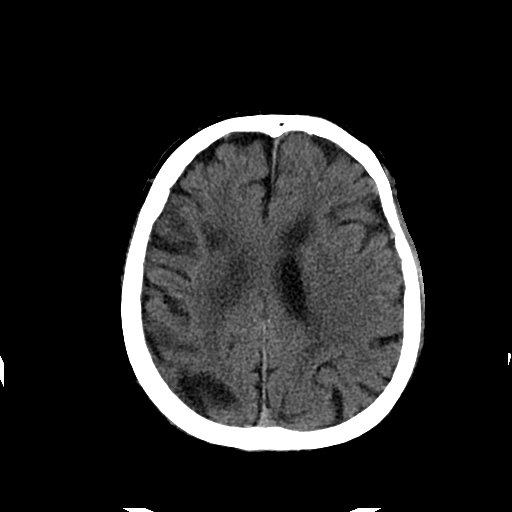
[im 23/36  bone]
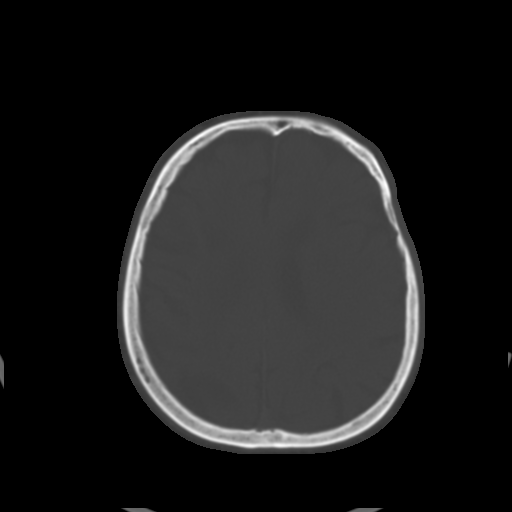
[im 26/36  brain]
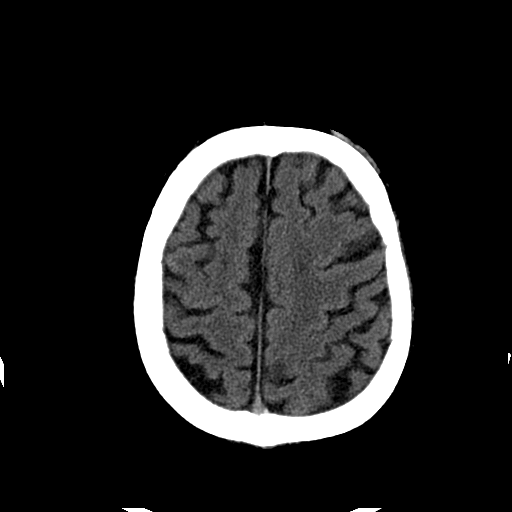
[im 28/36  brain]
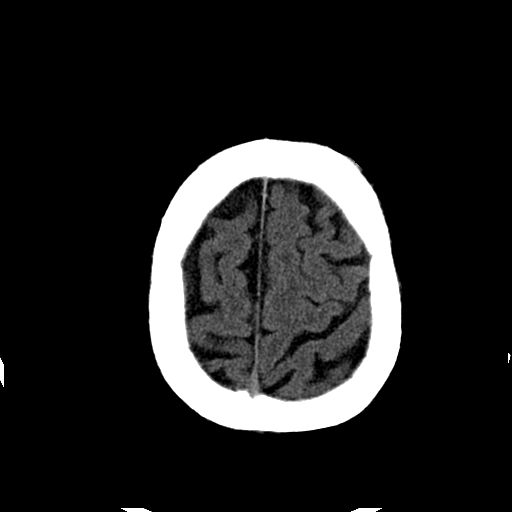
[im 31/36  brain]
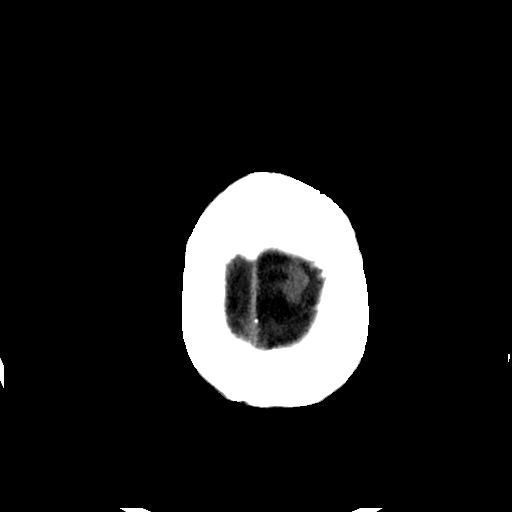
[im 33/36  brain]
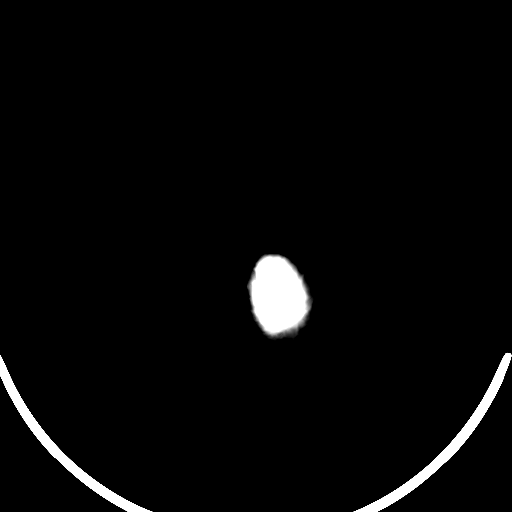
[im 33/36  bone]
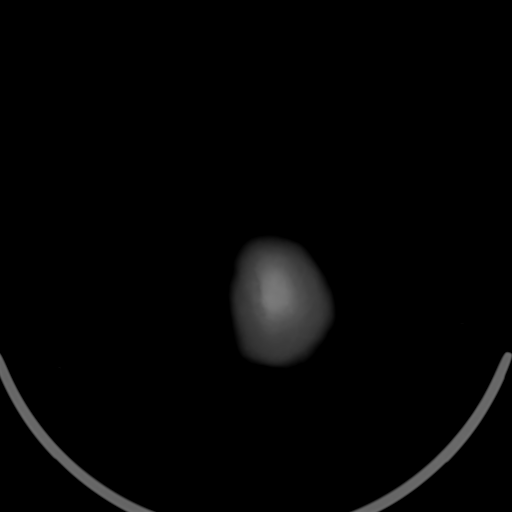

[Series 302: head w/o bone, idose (1) · axial · non-contrast · 0.49mm/px · z∈[+54,+79]mm · 2 of 36 slices shown]
[im 3/36  bone]
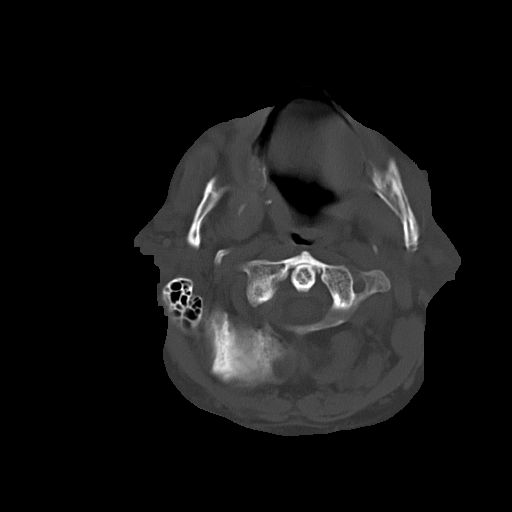
[im 8/36  bone]
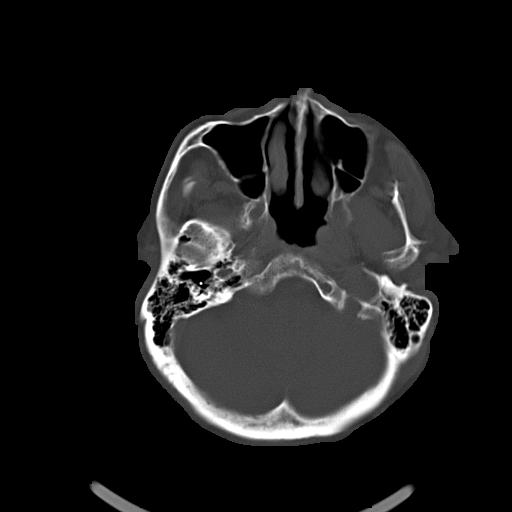

[15 of 30 positions shown; findings below may reference images not displayed]

FINDINGS: Motion artifact somewhat limits evaluation of the head CT.

Unremarkable appearance of the calvarium without acute fracture or
aggressive lesion.

Unremarkable appearance of the scalp soft tissues.

Unremarkable appearance of the bilateral orbits.

Left lens extraction

Mastoid air cells are clear.

Trace mucosal disease of the right frontal sinus extending through
the inferior recess into the ethmoid air cells. Remainder the
paranasal sinuses are relatively clear.

No acute intracranial hemorrhage.  No midline shift or mass effect.

Confluent hypodensity in the periventricular white matter overlying
the bilateral frontal horns and posterior horns. Hypodensity in the
right-sided centrum semi ovale, similar to comparison. No new focus
of hypodensity identified. Gray-white differentiation is relatively
maintained.
IMPRESSION: No CT evidence of acute intracranial abnormality.

Re- demonstration of chronic white matter disease. Associated
intracranial atherosclerosis.

## 2016-12-30 IMAGING — CR DG CHEST 1V PORT
1 series · 1 of 1 positions shown · non-contrast
Comparison: 03/29/2014

CLINICAL DATA: Recent fall

EXAM:
PORTABLE CHEST - 1 VIEW

[AP]
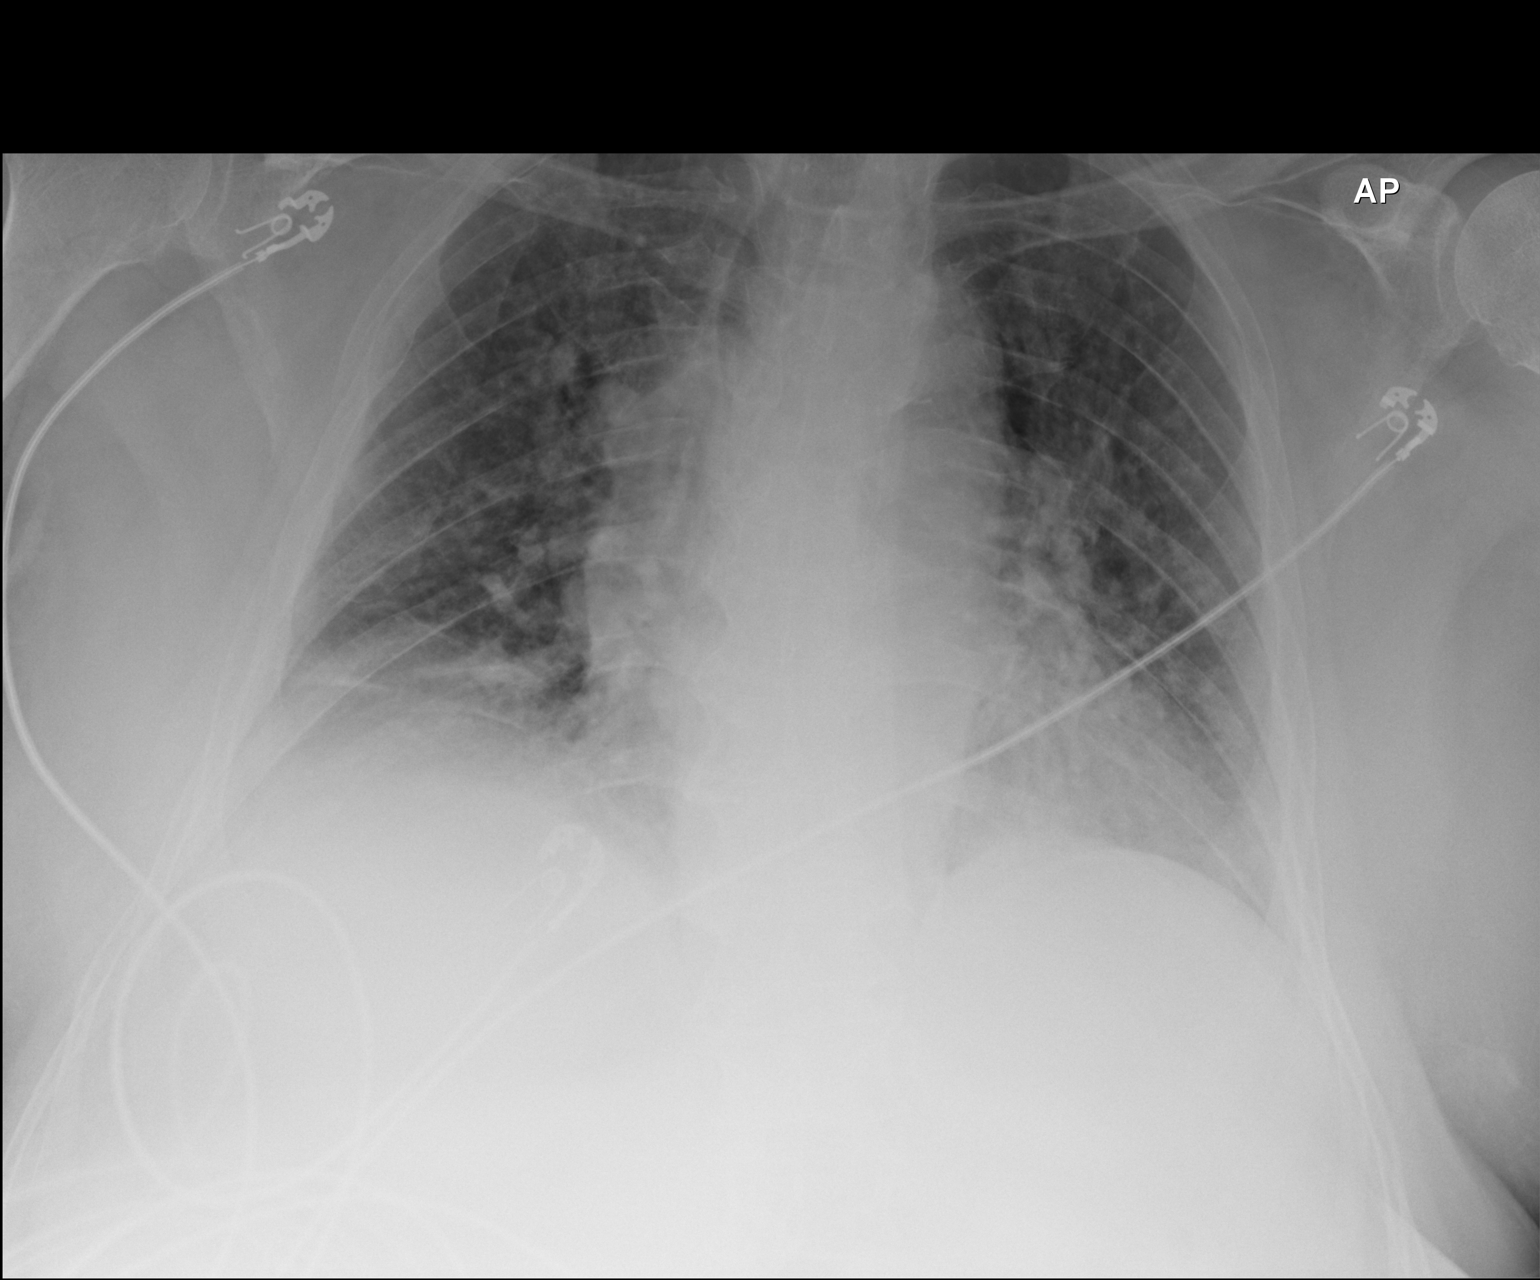

[1 of 1 positions shown; findings below may reference images not displayed]

FINDINGS: Cardiac shadow is mildly enlarged but accentuated by the portable
technique. Mild vascular congestion is noted. Right basilar
atelectasis is noted stable from the prior exam. No new focal
infiltrate is seen. No bony abnormality is noted.
IMPRESSION: Right basilar atelectasis.

Mild vascular congestion.

## 2016-12-31 IMAGING — CT CT ANGIO CHEST
2 of 9 series · 18 of 46 positions shown · IV contrast (OMNI)
Comparison: Chest radiograph 07/05/2014

CLINICAL DATA: Patient with multiple episodes of shortness of
breath.

EXAM:
CT ANGIOGRAPHY CHEST WITH CONTRAST
TECHNIQUE: Multidetector CT imaging of the chest was performed using the
standard protocol during bolus administration of intravenous
contrast. Multiplanar CT image reconstructions and MIPs were
obtained to evaluate the vascular anatomy.
CONTRAST:  80mL OMNIPAQUE IOHEXOL 350 MG/ML SOLN

[Series 5: thins · axial · 0.80mm/px · z∈[-859,-565]mm · 15 of 332 slices shown]
[im 19/332  lung]
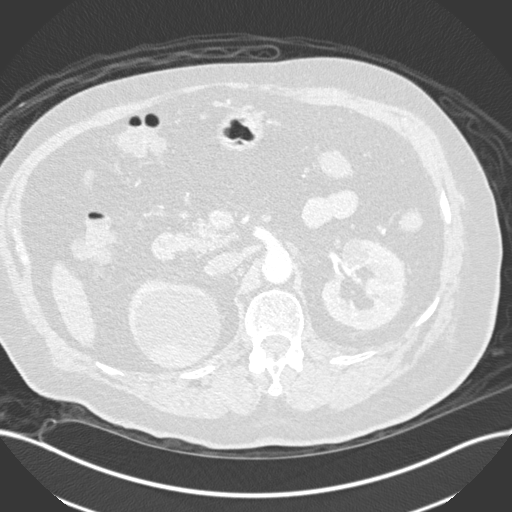
[im 37/332  soft-tissue]
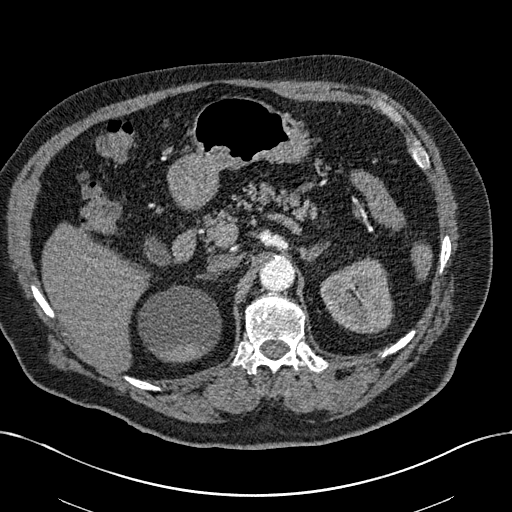
[im 56/332  lung]
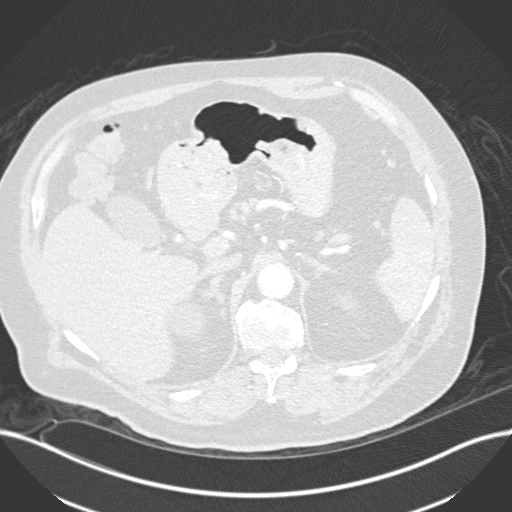
[im 74/332  soft-tissue]
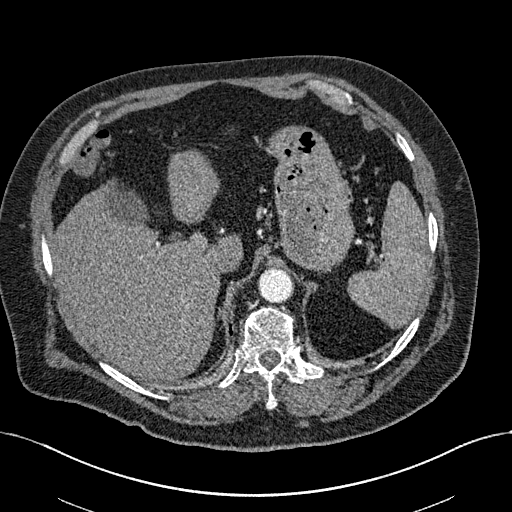
[im 111/332  lung]
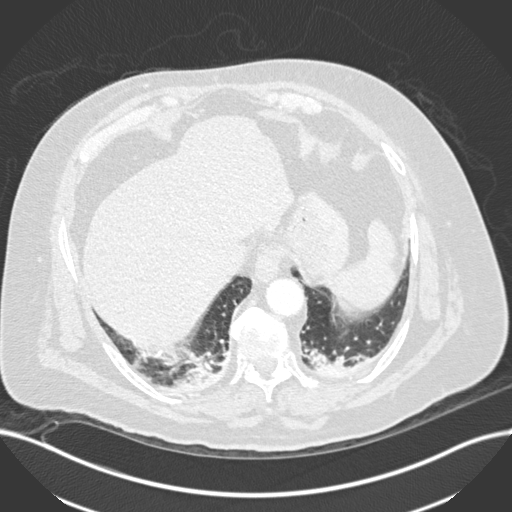
[im 129/332  soft-tissue]
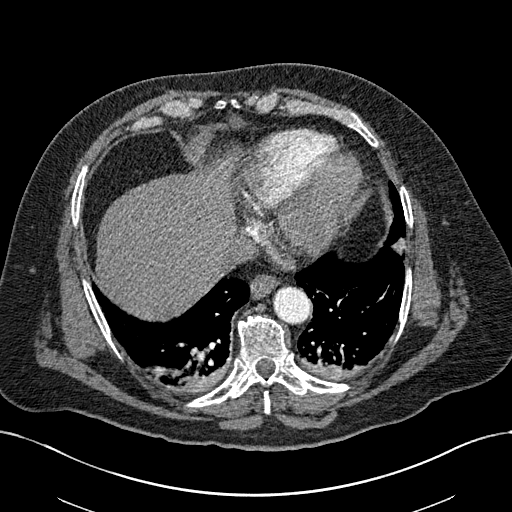
[im 148/332  lung]
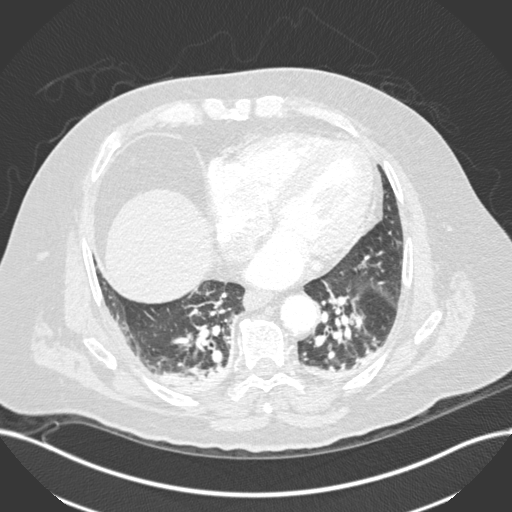
[im 166/332  soft-tissue]
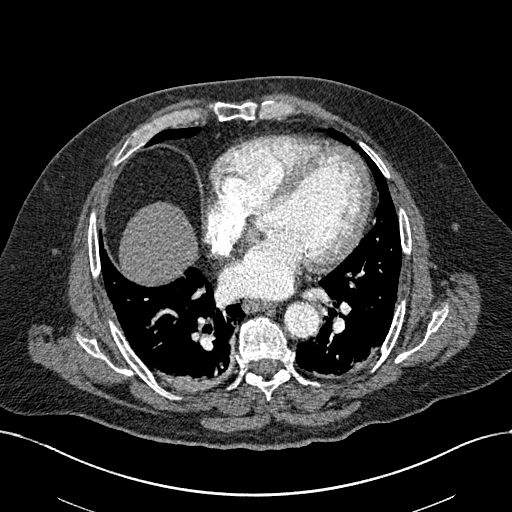
[im 184/332  lung]
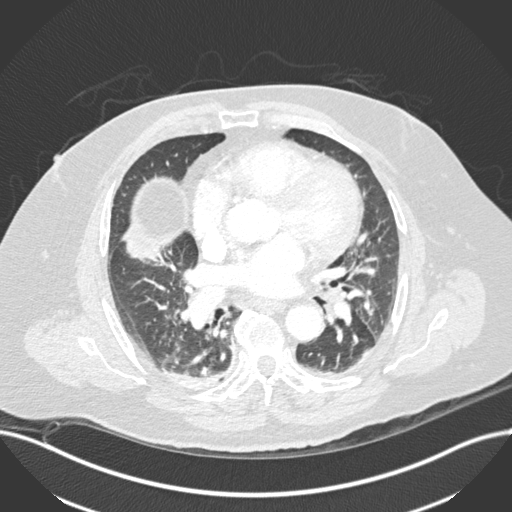
[im 203/332  soft-tissue]
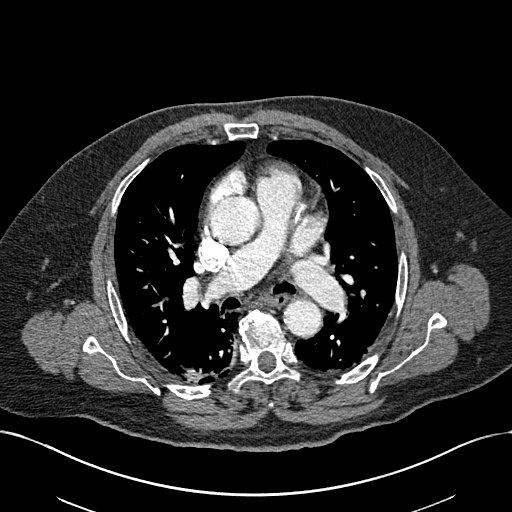
[im 221/332  lung]
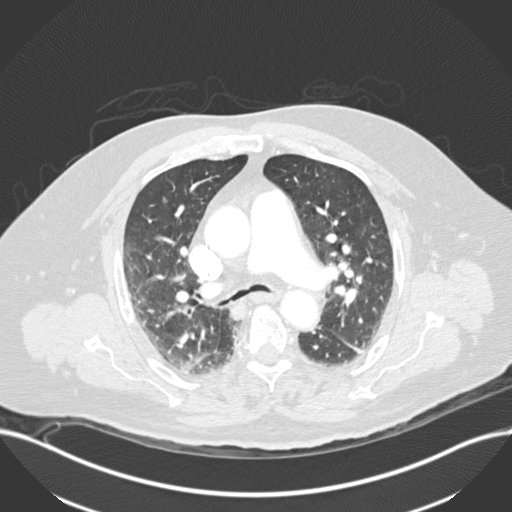
[im 258/332  soft-tissue]
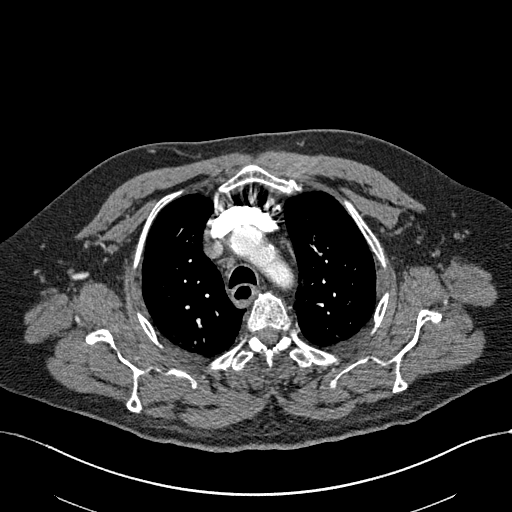
[im 276/332  lung]
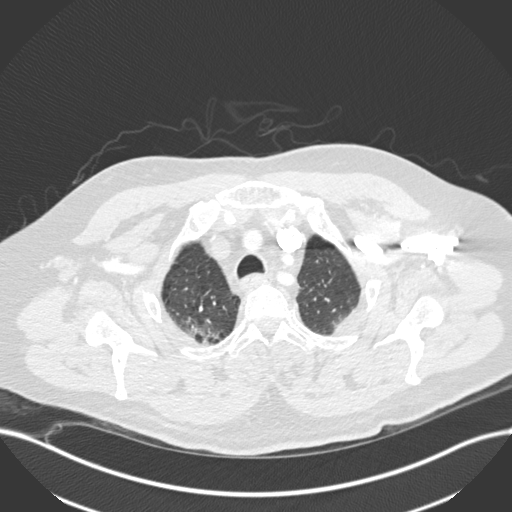
[im 295/332  soft-tissue]
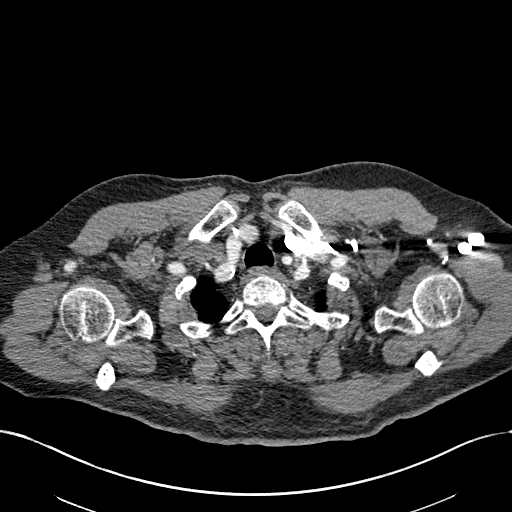
[im 313/332  lung]
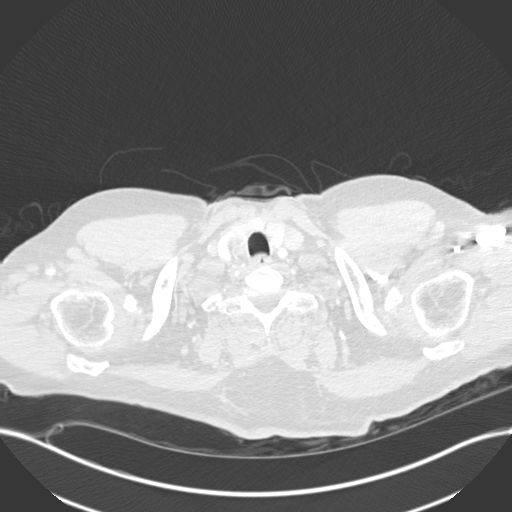

[Series 7: coronal mpr · coronal · 0.67mm/px · 3 of 149 slices shown]
[im 38/149  soft-tissue]
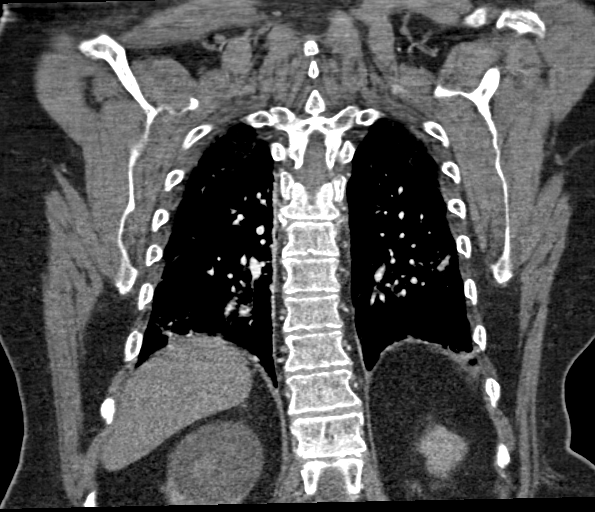
[im 75/149  soft-tissue]
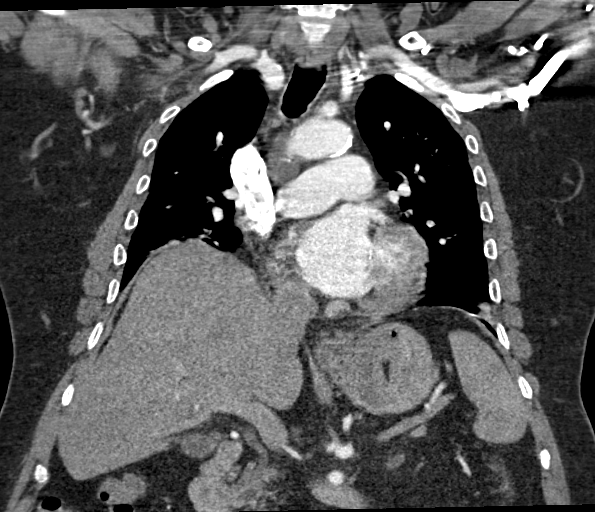
[im 112/149  soft-tissue]
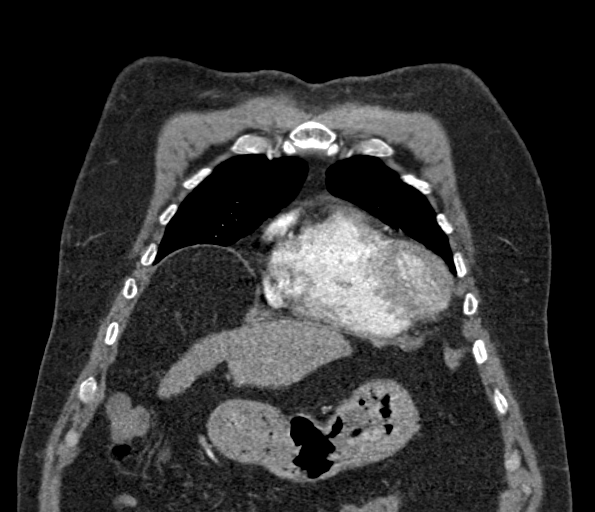

[18 of 46 positions shown; findings below may reference images not displayed]

FINDINGS: Mediastinum/Nodes: Visualized thyroid is unremarkable. No enlarged
axillary, mediastinal or hilar lymphadenopathy. Trace fluid within
the superior pericardial recess. Coronary arterial vascular
calcifications. Heart is enlarged.

Adequate opacification of the main pulmonary artery. No evidence for
pulmonary embolism.

Lungs/Pleura: Central airways are patent. There a few thin bands of
endobronchial material within the right aspect of the distal trachea
and right lower lobe bronchus, nonspecific however likely secondary
to mucous. Dependent consolidative opacities within the bilateral
lower lobes. No definite pleural effusion or pneumothorax.

Upper abdomen: Visualization of the upper abdomen demonstrates a
partially visualized 6.5 cm cystic mass within the superior pole of
the right kidney (image 104; series 4). When compared to prior
examinations there has been interval development of approximately 2
cm of high attenuation material along the posterior aspect of the
cyst. Cyst within the superior pole of the left kidney. Normal
adrenal glands.

Musculoskeletal: No aggressive or acute appearing osseous lesions.
Multilevel degenerative changes of thoracic spine.

Review of the MIP images confirms the above findings.
IMPRESSION: No evidence for pulmonary embolism.

Cystic lesion off the superior pole of the right kidney. In the
interval there has been development of high attenuation material
along the posterior aspect of the cystic lesion. This may represent
high attenuation proteinaceous debris however soft tissue mass is
not excluded. Recommend correlation with pre and post
contrast-enhanced MRI or CT in the non acute setting.

Endoluminal material within the lateral aspect of the distal trachea
and right lower lobe bronchus, favored to represent mucus. Consider
attention on short-term followup chest CT or further evaluation with
direct visualization as clinically indicated.

Bilateral lower lobe subpleural consolidative opacities favored to
represent atelectasis.

## 2017-01-03 ENCOUNTER — Other Ambulatory Visit: Payer: Self-pay | Admitting: *Deleted

## 2017-01-03 DIAGNOSIS — I10 Essential (primary) hypertension: Secondary | ICD-10-CM

## 2017-01-03 MED ORDER — AMIODARONE HCL 200 MG PO TABS: 100.0000 mg | ORAL_TABLET | Freq: Every day | ORAL | 2 refills | Status: DC

## 2017-01-03 NOTE — Telephone Encounter (Signed)
Patient called and requested a refill on amiodarone however patients pcp has been refilling this. Okay to refill? Please advise. Thanks, MI

## 2017-01-03 NOTE — Telephone Encounter (Signed)
Yes, please refill

## 2017-01-10 ENCOUNTER — Encounter (HOSPITAL_COMMUNITY): Payer: Self-pay | Admitting: *Deleted

## 2017-01-10 ENCOUNTER — Telehealth: Payer: Self-pay | Admitting: Internal Medicine

## 2017-01-10 IMAGING — CR DG CHEST 2V
1 series · 2 of 2 positions shown · non-contrast
Comparison: None.

CLINICAL DATA: Acute onset of cough. Atrial fibrillation. Initial
encounter.

EXAM:
CHEST  2 VIEW

[Series 1: w chest pa · 0.14mm/px · 2 of 2 slices shown]
[im 1/2]
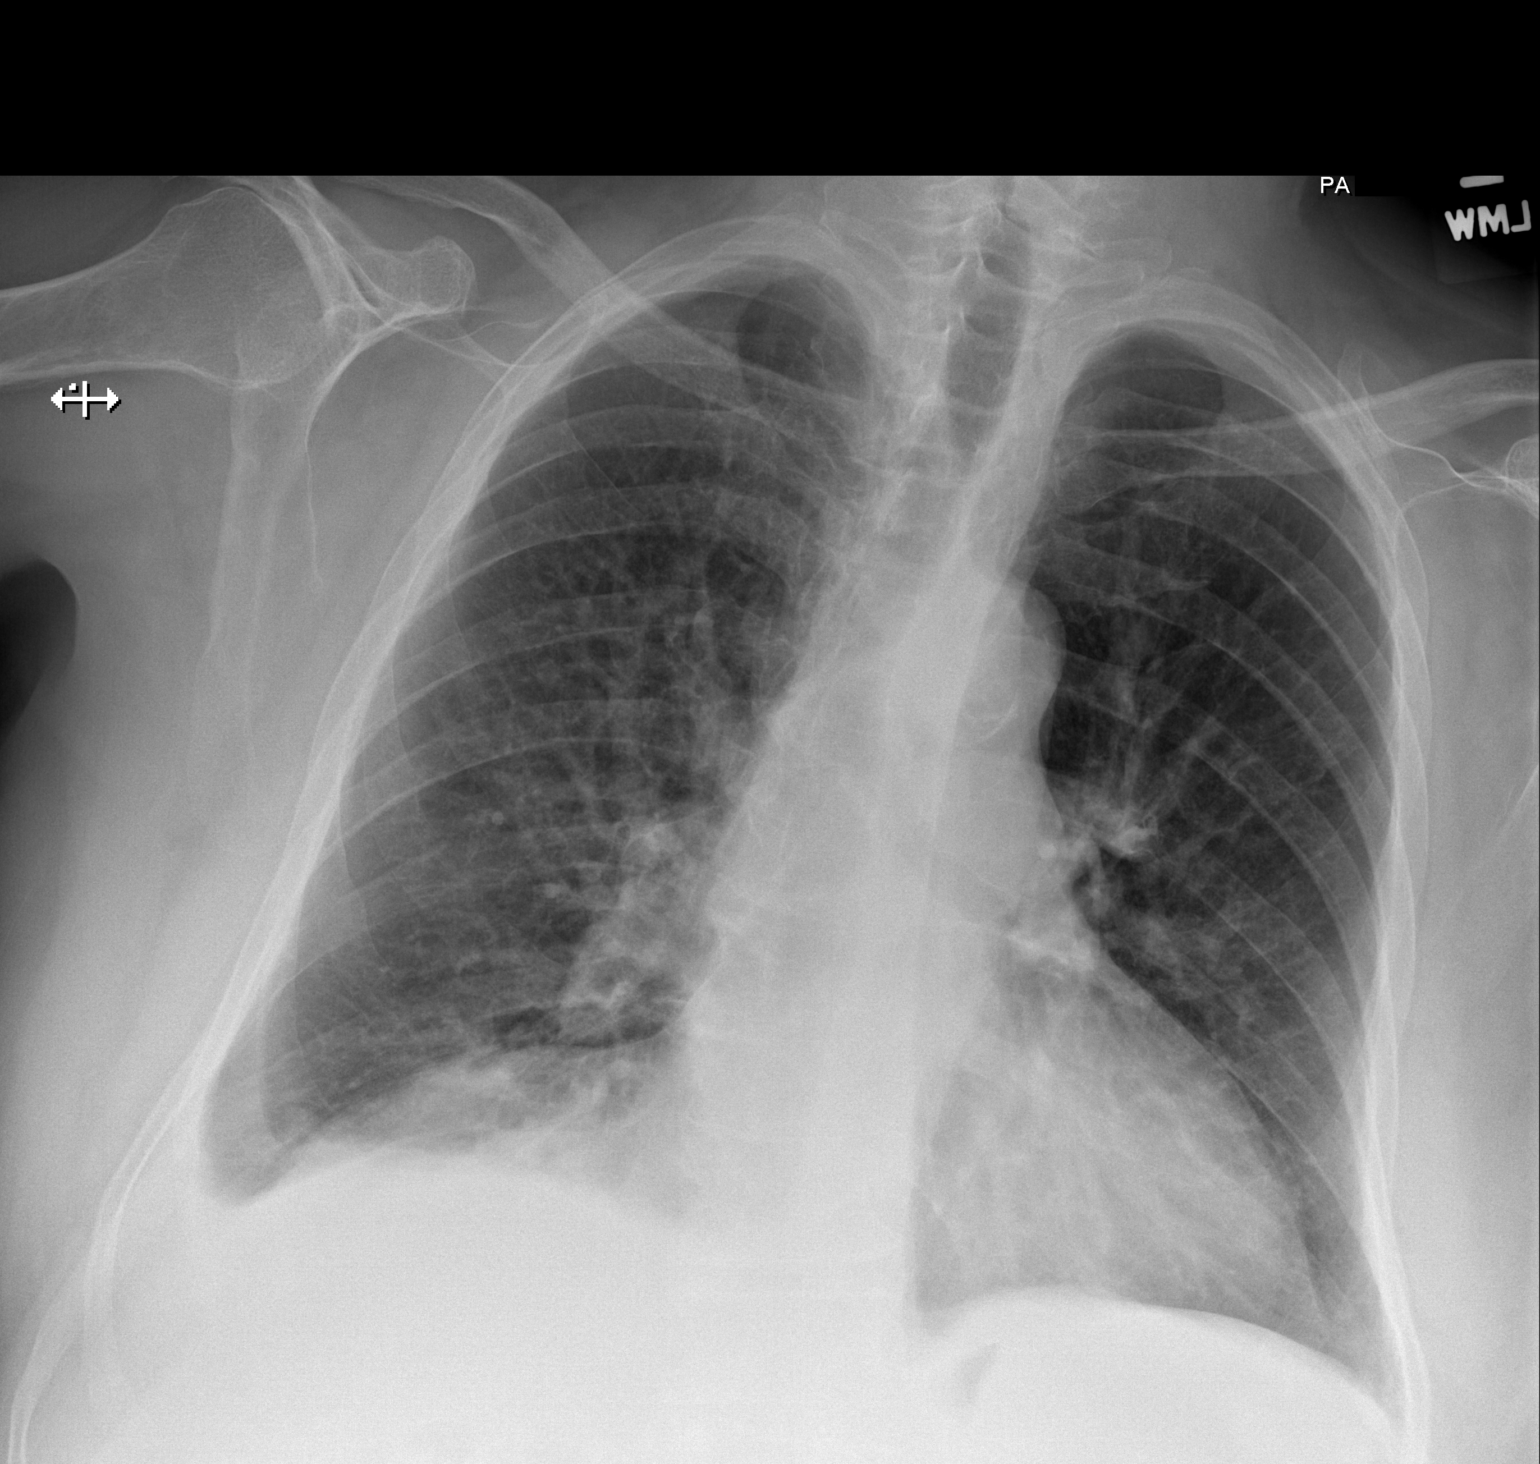
[im 2/2]
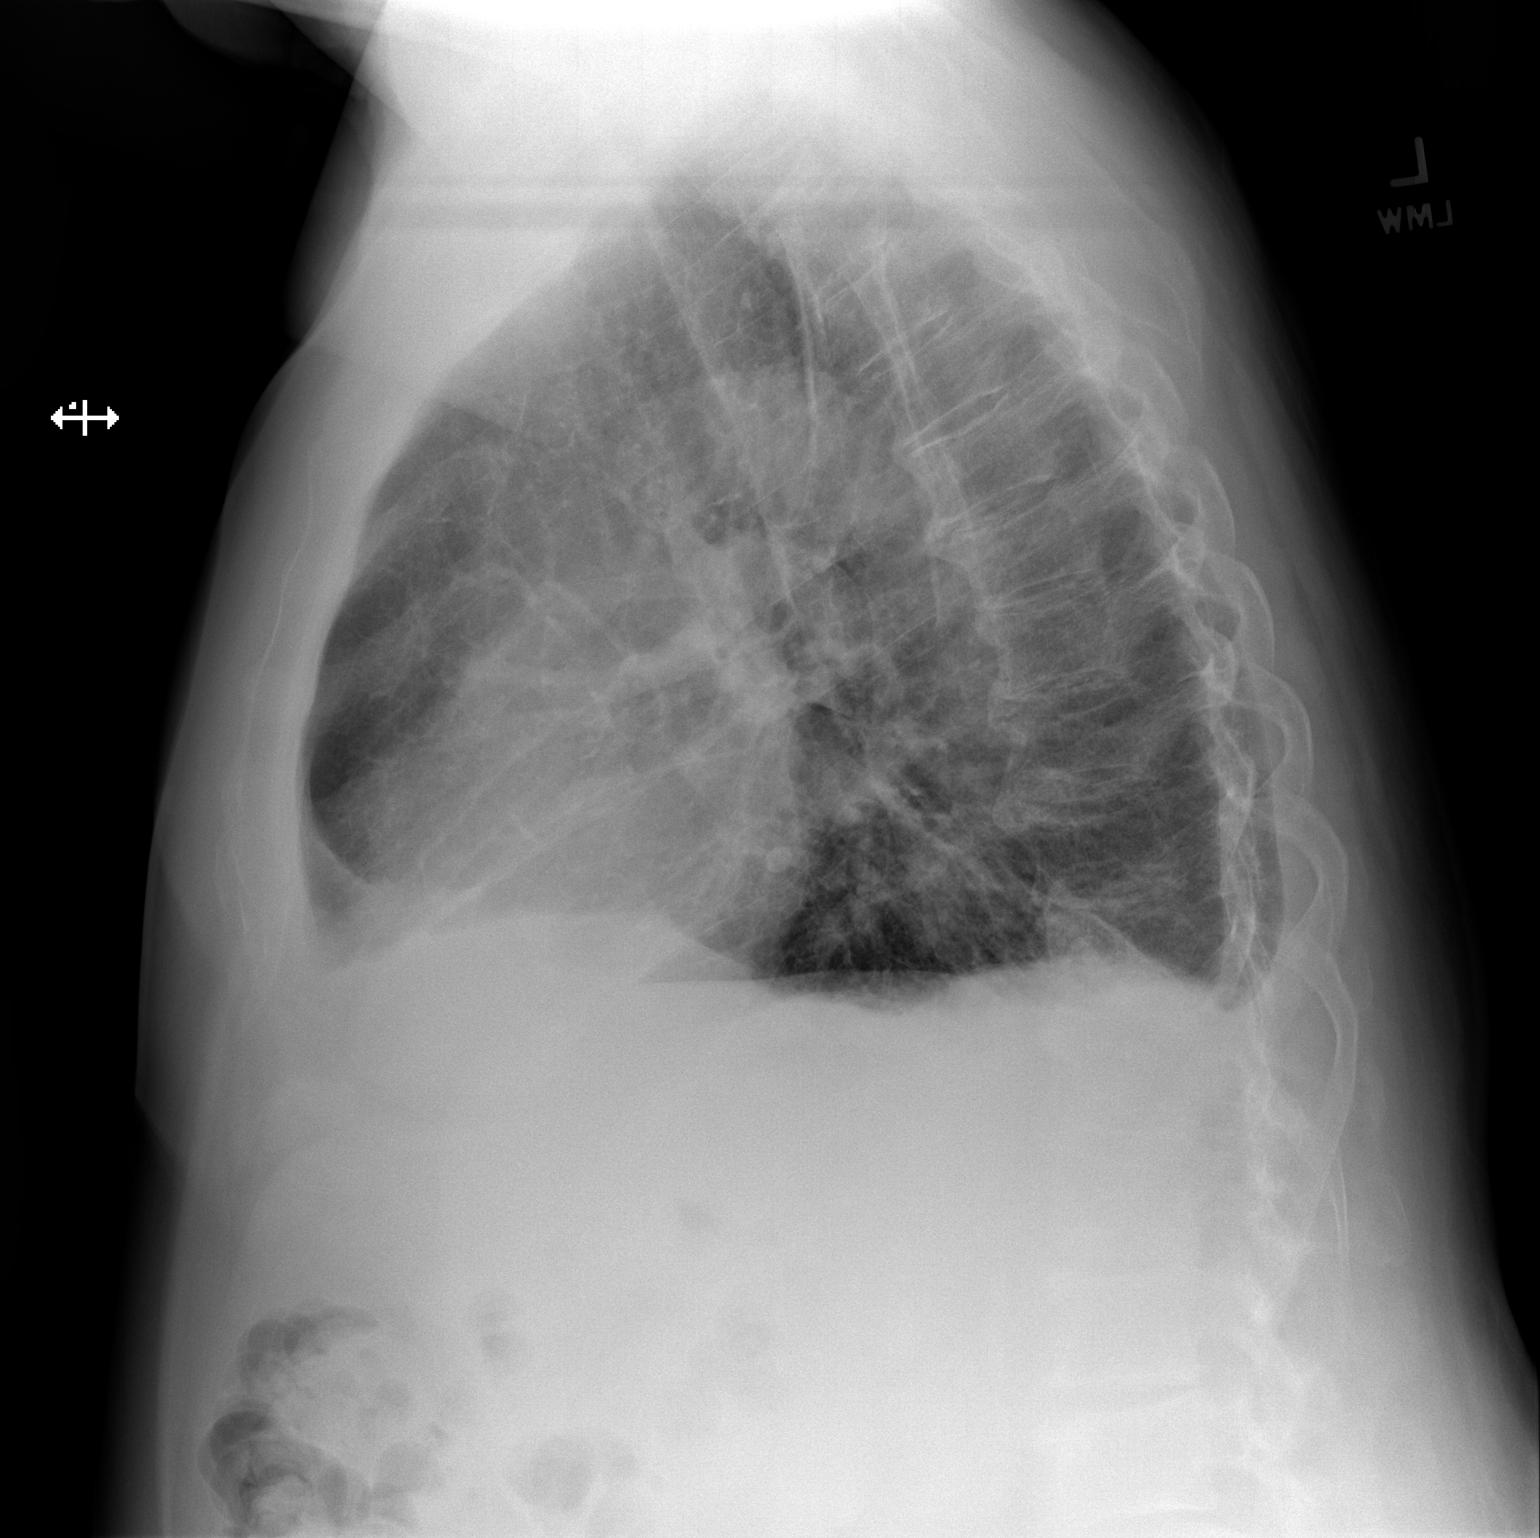

[2 of 2 positions shown; findings below may reference images not displayed]

FINDINGS: The lungs are well-aerated. Mild vascular congestion is noted.
Increased interstitial markings may reflect mild interstitial edema.
A small right pleural effusion is noted. No pneumothorax is seen.

The heart is borderline enlarged. No acute osseous abnormalities are
identified.
IMPRESSION: Mild vascular congestion and borderline cardiomegaly. Increased
interstitial markings may reflect mild interstitial edema. Small
right pleural effusion noted.

## 2017-01-10 NOTE — Telephone Encounter (Signed)
Left a message to call back with the question.

## 2017-01-12 NOTE — Telephone Encounter (Signed)
Left message for pt to call back  °

## 2017-01-13 ENCOUNTER — Encounter (HOSPITAL_COMMUNITY): Payer: Self-pay

## 2017-01-13 ENCOUNTER — Observation Stay (HOSPITAL_COMMUNITY): Payer: Medicare Other

## 2017-01-13 ENCOUNTER — Observation Stay (HOSPITAL_COMMUNITY)
Admission: EM | Admit: 2017-01-13 | Discharge: 2017-01-19 | Disposition: A | Discharge status: Home / Self Care | Payer: Medicare Other | Attending: Family Medicine | Admitting: Family Medicine

## 2017-01-13 ENCOUNTER — Emergency Department (HOSPITAL_COMMUNITY): Payer: Medicare Other

## 2017-01-13 ENCOUNTER — Other Ambulatory Visit: Payer: Self-pay

## 2017-01-13 DIAGNOSIS — E785 Hyperlipidemia, unspecified: Secondary | ICD-10-CM | POA: Insufficient documentation

## 2017-01-13 DIAGNOSIS — J449 Chronic obstructive pulmonary disease, unspecified: Secondary | ICD-10-CM | POA: Diagnosis not present

## 2017-01-13 DIAGNOSIS — Z7951 Long term (current) use of inhaled steroids: Secondary | ICD-10-CM | POA: Diagnosis not present

## 2017-01-13 DIAGNOSIS — E669 Obesity, unspecified: Secondary | ICD-10-CM | POA: Insufficient documentation

## 2017-01-13 DIAGNOSIS — M3390 Dermatopolymyositis, unspecified, organ involvement unspecified: Secondary | ICD-10-CM | POA: Insufficient documentation

## 2017-01-13 DIAGNOSIS — F250 Schizoaffective disorder, bipolar type: Principal | ICD-10-CM

## 2017-01-13 DIAGNOSIS — I252 Old myocardial infarction: Secondary | ICD-10-CM | POA: Insufficient documentation

## 2017-01-13 DIAGNOSIS — Z79899 Other long term (current) drug therapy: Secondary | ICD-10-CM | POA: Insufficient documentation

## 2017-01-13 DIAGNOSIS — E876 Hypokalemia: Secondary | ICD-10-CM | POA: Diagnosis not present

## 2017-01-13 DIAGNOSIS — G9340 Encephalopathy, unspecified: Secondary | ICD-10-CM

## 2017-01-13 DIAGNOSIS — I480 Paroxysmal atrial fibrillation: Secondary | ICD-10-CM | POA: Insufficient documentation

## 2017-01-13 DIAGNOSIS — W19XXXA Unspecified fall, initial encounter: Secondary | ICD-10-CM | POA: Insufficient documentation

## 2017-01-13 DIAGNOSIS — J410 Simple chronic bronchitis: Secondary | ICD-10-CM

## 2017-01-13 DIAGNOSIS — R569 Unspecified convulsions: Secondary | ICD-10-CM

## 2017-01-13 DIAGNOSIS — E1140 Type 2 diabetes mellitus with diabetic neuropathy, unspecified: Secondary | ICD-10-CM | POA: Insufficient documentation

## 2017-01-13 DIAGNOSIS — I7300 Raynaud's syndrome without gangrene: Secondary | ICD-10-CM | POA: Insufficient documentation

## 2017-01-13 DIAGNOSIS — Z87891 Personal history of nicotine dependence: Secondary | ICD-10-CM | POA: Insufficient documentation

## 2017-01-13 DIAGNOSIS — Z9114 Patient's other noncompliance with medication regimen: Secondary | ICD-10-CM | POA: Diagnosis not present

## 2017-01-13 DIAGNOSIS — Z818 Family history of other mental and behavioral disorders: Secondary | ICD-10-CM | POA: Insufficient documentation

## 2017-01-13 DIAGNOSIS — K222 Esophageal obstruction: Secondary | ICD-10-CM | POA: Insufficient documentation

## 2017-01-13 DIAGNOSIS — Z538 Procedure and treatment not carried out for other reasons: Secondary | ICD-10-CM | POA: Insufficient documentation

## 2017-01-13 DIAGNOSIS — F419 Anxiety disorder, unspecified: Secondary | ICD-10-CM | POA: Insufficient documentation

## 2017-01-13 DIAGNOSIS — I10 Essential (primary) hypertension: Secondary | ICD-10-CM | POA: Diagnosis present

## 2017-01-13 DIAGNOSIS — Z8673 Personal history of transient ischemic attack (TIA), and cerebral infarction without residual deficits: Secondary | ICD-10-CM | POA: Insufficient documentation

## 2017-01-13 DIAGNOSIS — Z6833 Body mass index (BMI) 33.0-33.9, adult: Secondary | ICD-10-CM | POA: Insufficient documentation

## 2017-01-13 DIAGNOSIS — Z7982 Long term (current) use of aspirin: Secondary | ICD-10-CM | POA: Insufficient documentation

## 2017-01-13 DIAGNOSIS — I4891 Unspecified atrial fibrillation: Secondary | ICD-10-CM | POA: Diagnosis present

## 2017-01-13 DIAGNOSIS — R4182 Altered mental status, unspecified: Secondary | ICD-10-CM | POA: Diagnosis present

## 2017-01-13 DIAGNOSIS — K219 Gastro-esophageal reflux disease without esophagitis: Secondary | ICD-10-CM | POA: Diagnosis not present

## 2017-01-13 DIAGNOSIS — I2721 Secondary pulmonary arterial hypertension: Secondary | ICD-10-CM | POA: Diagnosis not present

## 2017-01-13 DIAGNOSIS — I2510 Atherosclerotic heart disease of native coronary artery without angina pectoris: Secondary | ICD-10-CM | POA: Diagnosis not present

## 2017-01-13 DIAGNOSIS — F259 Schizoaffective disorder, unspecified: Secondary | ICD-10-CM

## 2017-01-13 DIAGNOSIS — E1151 Type 2 diabetes mellitus with diabetic peripheral angiopathy without gangrene: Secondary | ICD-10-CM | POA: Insufficient documentation

## 2017-01-13 DIAGNOSIS — T148XXA Other injury of unspecified body region, initial encounter: Secondary | ICD-10-CM

## 2017-01-13 DIAGNOSIS — G40909 Epilepsy, unspecified, not intractable, without status epilepticus: Secondary | ICD-10-CM | POA: Insufficient documentation

## 2017-01-13 DIAGNOSIS — S62251A Displaced fracture of neck of first metacarpal bone, right hand, initial encounter for closed fracture: Secondary | ICD-10-CM | POA: Insufficient documentation

## 2017-01-13 DIAGNOSIS — I351 Nonrheumatic aortic (valve) insufficiency: Secondary | ICD-10-CM | POA: Insufficient documentation

## 2017-01-13 LAB — CBC WITH DIFFERENTIAL/PLATELET
BASOS ABS: 0 10*3/uL (ref 0.0–0.1)
BASOS PCT: 0 %
EOS PCT: 0 %
Eosinophils Absolute: 0 10*3/uL (ref 0.0–0.7)
HCT: 44.4 % (ref 39.0–52.0)
Hemoglobin: 14.2 g/dL (ref 13.0–17.0)
LYMPHS PCT: 15 %
Lymphs Abs: 2 10*3/uL (ref 0.7–4.0)
MCH: 26.1 pg (ref 26.0–34.0)
MCHC: 32 g/dL (ref 30.0–36.0)
MCV: 81.5 fL (ref 78.0–100.0)
MONO ABS: 1.1 10*3/uL — AB (ref 0.1–1.0)
Monocytes Relative: 8 %
Neutro Abs: 10.2 10*3/uL — ABNORMAL HIGH (ref 1.7–7.7)
Neutrophils Relative %: 77 %
PLATELETS: 251 10*3/uL (ref 150–400)
RBC: 5.45 MIL/uL (ref 4.22–5.81)
RDW: 17.1 % — AB (ref 11.5–15.5)
WBC: 13.3 10*3/uL — AB (ref 4.0–10.5)

## 2017-01-13 LAB — URINALYSIS, ROUTINE W REFLEX MICROSCOPIC
BILIRUBIN URINE: NEGATIVE
GLUCOSE, UA: NEGATIVE mg/dL
Ketones, ur: 20 mg/dL — AB
LEUKOCYTES UA: NEGATIVE
NITRITE: NEGATIVE
Protein, ur: 100 mg/dL — AB
SPECIFIC GRAVITY, URINE: 1.02 (ref 1.005–1.030)
pH: 6 (ref 5.0–8.0)

## 2017-01-13 LAB — RAPID URINE DRUG SCREEN, HOSP PERFORMED
Amphetamines: NOT DETECTED
BARBITURATES: NOT DETECTED
BENZODIAZEPINES: POSITIVE — AB
COCAINE: NOT DETECTED
Opiates: NOT DETECTED
Tetrahydrocannabinol: NOT DETECTED

## 2017-01-13 LAB — COMPREHENSIVE METABOLIC PANEL
ALK PHOS: 105 U/L (ref 38–126)
ALT: 40 U/L (ref 17–63)
ANION GAP: 9 (ref 5–15)
AST: 88 U/L — AB (ref 15–41)
Albumin: 3.7 g/dL (ref 3.5–5.0)
BILIRUBIN TOTAL: 2.1 mg/dL — AB (ref 0.3–1.2)
BUN: 18 mg/dL (ref 6–20)
CALCIUM: 8.7 mg/dL — AB (ref 8.9–10.3)
CO2: 25 mmol/L (ref 22–32)
Chloride: 103 mmol/L (ref 101–111)
Creatinine, Ser: 1.05 mg/dL (ref 0.61–1.24)
GFR calc Af Amer: >60 mL/min (ref >60)
GLUCOSE: 88 mg/dL (ref 65–99)
POTASSIUM: 4 mmol/L (ref 3.5–5.1)
Sodium: 137 mmol/L (ref 135–145)
TOTAL PROTEIN: 6.9 g/dL (ref 6.5–8.1)

## 2017-01-13 LAB — CBG MONITORING, ED
GLUCOSE-CAPILLARY: 91 mg/dL (ref 65–99)
Glucose-Capillary: 88 mg/dL (ref 65–99)

## 2017-01-13 LAB — ETHANOL: Alcohol, Ethyl (B): <10 mg/dL (ref <10)

## 2017-01-13 LAB — I-STAT CG4 LACTIC ACID, ED: Lactic Acid, Venous: 1.65 mmol/L (ref 0.5–1.9)

## 2017-01-13 LAB — PROTIME-INR
INR: 1.09
Prothrombin Time: 14 seconds (ref 11.4–15.2)

## 2017-01-13 LAB — CK: CK TOTAL: 3532 U/L — AB (ref 49–397)

## 2017-01-13 MED ORDER — INSULIN ASPART 100 UNIT/ML ~~LOC~~ SOLN
0.0000 [IU] | Freq: Three times a day (TID) | SUBCUTANEOUS | Status: DC
  Administered 2017-01-14 – 2017-01-18 (×3): 1 [IU] via SUBCUTANEOUS

## 2017-01-13 MED ORDER — SODIUM CHLORIDE 0.9 % IV SOLN
INTRAVENOUS | Status: DC
  Administered 2017-01-13: 19:00:00 via INTRAVENOUS

## 2017-01-13 MED ORDER — INSULIN ASPART 100 UNIT/ML ~~LOC~~ SOLN
0.0000 [IU] | Freq: Every day | SUBCUTANEOUS | Status: DC
Start: 2017-01-13 — End: 2017-01-19

## 2017-01-13 MED ORDER — ALPRAZOLAM 0.5 MG PO TABS
1.0000 mg | ORAL_TABLET | Freq: Three times a day (TID) | ORAL | Status: DC | PRN
  Administered 2017-01-14 – 2017-01-17 (×3): 1 mg via ORAL
  Filled 2017-01-13 (×4): qty 2

## 2017-01-13 NOTE — ED Notes (Signed)
Report attempted 

## 2017-01-13 NOTE — Progress Notes (Signed)
Left message 01-01-15, 01-03-17, spoke with patient on 01-10-17 and did medical history, patient unable to write  instructions down, patient to call back when help there to write instructions down, left message 01-12-17, left message 01-13-17

## 2017-01-13 NOTE — Telephone Encounter (Signed)
Left message for pt to call back  °

## 2017-01-13 NOTE — ED Notes (Signed)
Pt uncooperative in MRI, MRI tech called to report.

## 2017-01-13 NOTE — ED Triage Notes (Signed)
Pt arrived via [facility] from home found on floor oriented to self only.  CBG 156.  [facility]S at house last night refused transport.

## 2017-01-13 NOTE — ED Notes (Signed)
Patient transported to MRI 

## 2017-01-13 NOTE — ED Notes (Signed)
Advised Dr. Maudie Mercury MRI advised to cancel

## 2017-01-13 NOTE — ED Notes (Signed)
Pt denies claustrophobia  

## 2017-01-13 NOTE — ED Provider Notes (Signed)
[facility] EMERGENCY DEPARTMENT Provider Note   CSN: [id number] Arrival date & time: 01/13/17  1627     History   Chief Complaint Chief Complaint  Patient presents with  . Altered Mental Status    HPI Patrick Holt is a 66 y.o. male.  Pt presents to the ED today with altered mental status.  The pt has multiple medical problems and was found on the ground today altered.  Pt is unable to give any hx.  It is unclear how long he was on the ground.  Apparently, EMS was at the house last night, but pt refused transport.  The pt tells me that his head hurts, but he rambles on and talks about nonsense, so it is unclear if anything else hurts.      Past Medical History:  Diagnosis Date  . Anemia   . Anxiety   . Asthma   . Atrial fibrillation ([place]) 01/2012   with RVR  . Bipolar 1 disorder ([place])   . Cancer ([place])   . Collagen vascular disease ([place])   . Conversion disorder 06/2010  . COPD (chronic obstructive pulmonary disease) ([place])   . Dermatomyositis ([place])   . Dermatomyositis ([place])   . Diverticulitis   . Diverticulosis of colon   . Dysrhythmia    "irregular" (11/15/2012)  . Esophageal dysmotility   . Esophageal stricture   . Fibromyalgia   . Gastritis   . GERD (gastroesophageal reflux disease)   . Hand fracture, right    sept 13, 2017, still wearing splint as og 01-10-2017  . Headache(784.0)    "severe; get them often" (11/15/2012)  . Hiatal hernia   . History of narcotic addiction ([place])   . Hx of adenomatous colonic polyps   . Hyperlipidemia   . Hypertension   . Internal hemorrhoids   . Ischemic heart disease   . Major depression    with acute psychotic break in 06/2010  . Myocardial infarct ([place])    mulitple (1999, 2000, 2004)  . Narcotic dependence ([place])   . Nephrolithiasis   . Obesity   . OCD (obsessive compulsive disorder)   . Otosclerosis   . Paroxysmal A-fib ([place])   . Peripheral neuropathy   . Raynaud's disease   . Renal  insufficiency   . Rheumatoid arthritis(714.0)   . Sarcoidosis   . Seizures ([place])   . Subarachnoid hemorrhage ([place]) 01/2012   with subdural  hematoma.   . Type II diabetes mellitus ([place])   . Urge incontinence   . Vertigo     Patient Active Problem List   Diagnosis Date Noted  . Altered mental status 01/13/2017  . Low oxygen saturation   . Pain in right wrist 02/27/2016  . Altered mental state 11/14/2015  . Severe dehydration 11/14/2015  . Hypernatremia 11/14/2015  . Difficulty in walking, not elsewhere classified   . Right wrist fracture   . Closed displaced fracture of ulnar styloid with routine healing   . Scapholunate ligament injury with no instability   . Falls frequently   . Hypoxia 11/02/2015  . Anxiety 06/06/2015  . Impaired mobility 04/28/2015  . Lumbar spondylosis with myelopathy 03/07/2015  . Nocturia 12/03/2014  . Chronic venous insufficiency 09/23/2014  . Poor social situation 07/30/2014  . Schizophrenia, acute ([place])   . Schizoaffective disorder, unspecified type ([place])   . Decreased visual acuity 06/27/2014  . COPD exacerbation ([place])   . Right sided weakness 01/04/2014  . Essential hypertension, benign   .  Atrial fibrillation, unspecified   . Radiculopathy of cervical region   . Polyuria 12/07/2013  . Erectile dysfunction 12/07/2013  . Diverticulitis 08/01/2013  . Acute diverticulitis 08/01/2013  . Hematochezia 06/04/2013  . Hypokalemia 10/03/2012  . Benign neoplasm of colon 09/27/2012  . Stricture and stenosis of esophagus 09/27/2012  . Diverticulitis of colon without hemorrhage 07/26/2012  . Polypharmacy 03/26/2012  . Seizures ([place]) 02/10/2012  . Atrial fibrillation ([place]) 02/05/2012  . Coronary atherosclerosis of native coronary artery 02/05/2012  . Radicular low back pain 03/09/2011  . History of seizure disorder 01/29/2011  . Irritable bowel syndrome (IBS) 06/24/2010  . Urge incontinence 06/19/2010  . TOBACCO USER 11/23/2008  . DEPRESSION,  MAJOR 05/31/2008  . ISCHEMIC HEART DISEASE 05/31/2008  . RAYNAUD'S DISEASE 05/31/2008  . HIATAL HERNIA 05/31/2008  . Rheumatoid arthritis ([place]) 05/31/2008  . FIBROMYALGIA 05/31/2008  . Hyperlipidemia 01/26/2008  . Anxiety state 01/26/2008  . HEMORRHOIDS, INTERNAL 01/26/2008  . COPD (chronic obstructive pulmonary disease) ([place]) 01/26/2008  . ESOPHAGEAL MOTILITY DISORDER 01/26/2008  . GERD 01/26/2008  . Dermatomyositis ([place]) 01/26/2008    Past Surgical History:  Procedure Laterality Date  . BACK SURGERY    . CARDIAC CATHETERIZATION    . CARPAL TUNNEL RELEASE Bilateral   . CATARACT EXTRACTION W/ INTRAOCULAR LENS IMPLANT Left   . COLONOSCOPY N/A 09/27/2012   Procedure: COLONOSCOPY;  Surgeon: Lafayette Dragon, MD;  Location: [facility] ENDOSCOPY;  Service: Endoscopy;  Laterality: N/A;  . ESOPHAGOGASTRODUODENOSCOPY N/A 09/27/2012   Procedure: ESOPHAGOGASTRODUODENOSCOPY (EGD);  Surgeon: Lafayette Dragon, MD;  Location: [facility];  Service: Endoscopy;  Laterality: N/A;  . KNEE ARTHROSCOPY W/ MENISCAL REPAIR Left 2009  . LUMBAR DISC SURGERY    . LYMPH NODE DISSECTION Right 1970's   "neck; dr thought I had Hodgkins; turned out to be sarcoidosis" (11/15/2012)  . squamous papilloma   2010   removed by Dr. Constance Holster ENT, noted on tongue  . TONSILLECTOMY         Home Medications    Prior to Admission medications   Medication Sig Start Date End Date Taking? Authorizing Provider  ALPRAZolam Duanne Moron) 1 MG tablet Take 1 mg by mouth 3 (three) times daily.    [provider]  amiodarone (PACERONE) 200 MG tablet Take 0.5 tablets (100 mg total) daily by mouth. 01/03/17   Fay Records, MD  amLODipine (NORVASC) 5 MG tablet Take 1 tablet (5 mg total) by mouth daily. 11/05/15   Hickory Bing, DO  aspirin EC 81 MG tablet Take 1 tablet (81 mg total) by mouth daily. 10/17/15   [name], MD  atorvastatin (LIPITOR) 40 MG tablet Take 1 tablet (40 mg total) by mouth daily. Patient taking differently: Take  40 mg by mouth at bedtime.  10/17/15   [name], MD  budesonide-formoterol (SYMBICORT) 160-4.5 MCG/ACT inhaler Inhale 2 puffs into the lungs 2 (two) times daily. 11/22/16   Doreatha Lew, MD  divalproex (DEPAKOTE ER) 250 MG 24 hr tablet Take 3 tablets (750 mg total) by mouth daily. 12/15/15   [name], MD  esomeprazole (NEXIUM) 40 MG capsule Take 40 mg by mouth daily. 07/26/16   [provider]  ipratropium-albuterol (DUONEB) 0.5-2.5 (3) MG/3ML SOLN Inhale 3 mLs into the lungs every 6 (six) hours as needed. Patient taking differently: Inhale 3 mLs into the lungs every 6 (six) hours as needed (for breathing).  11/17/15   Elgergawy, Silver Huguenin, MD  levETIRAcetam (KEPPRA) 750 MG tablet Take 1 tablet (  750 mg total) by mouth 2 (two) times daily. TAKE 1 TABLET(500 MG) BY MOUTH TWICE DAILY 11/22/16   Patrecia Pour, Christean Grief, MD  loperamide (IMODIUM) 2 MG capsule TAKE 1 TO 2 CAPSULES BY MOUTH 3 TIMES A DAY AS NEEDED FOR DIARRHEA Patient taking differently: Take 2 mg by mouth every 8 (eight) hours as needed for diarrhea or loose stools.  08/20/15   [name], MD  Multiple Vitamin (MULTIVITAMIN WITH MINERALS) TABS tablet Take 1 tablet by mouth daily. 10/17/15   [name], MD  oxybutynin (DITROPAN) 5 MG tablet Take 5 mg by mouth daily. 07/26/16   [provider]  Potassium Chloride ER 20 MEQ TBCR Take 1 tablet by mouth every other day.  11/29/14   [provider]  predniSONE (DELTASONE) 10 MG tablet Take 4 tablets for 3 days; Take 3 tablets for 4 days; Take 2 tablets for 3 days; Take 1 tablet for 4 days Patient not taking: Reported on 12/17/2016 11/22/16   Patrecia Pour, Christean Grief, MD  risperiDONE (RISPERDAL) 1 MG tablet Take 1 tablet (1 mg total) by mouth 2 (two) times daily. 12/15/15   [name], MD    Family History Family History  Problem Relation Age of Onset  . Alcohol abuse Mother   . Depression Mother   . Heart disease Mother   . Diabetes Mother   . Stroke Mother     . Coronary artery disease Father   . Diabetes Other        1/2 brother  . Hepatitis Brother     Social History Social History   Tobacco Use  . Smoking status: Former Smoker    Packs/day: 0.50    Years: 30.00    Pack years: 15.00    Types: Cigarettes    Last attempt to quit: 12/15/2014    Years since quitting: 2.0  . Smokeless tobacco: Never Used  . Tobacco comment: 3 cigs a day  Substance Use Topics  . Alcohol use: No    Alcohol/week: 0.0 oz    Comment: Alcohol stopped in September of 2014  . Drug use: No     Allergies   Immune globulins; Ciprofloxacin; Flagyl [metronidazole]; Lisinopril; Sulfa antibiotics; Morphine and related; Requip [ropinirole hcl]; Betamethasone dipropionate; Bupropion hcl; Clobetasol; Codeine; Escitalopram oxalate; Fluoxetine hcl; Furosemide; Meclizine; Paroxetine; Penicillins; Tacrolimus; Tetanus toxoid; and Tuberculin purified protein derivative   Review of Systems Review of Systems  Unable to perform ROS: Mental status change     Physical Exam Updated Vital Signs BP 120/66   Pulse 79   Temp 99.1 F (37.3 C) (Rectal)   Resp (!) 23   Ht 5\' 7"  (1.702 m)   Wt 102.1 kg (225 lb)   SpO2 94%   BMI 35.24 kg/m   Physical Exam  Constitutional: He appears well-developed. He appears lethargic.  HENT:  Head: Normocephalic and atraumatic.  Right Ear: External ear normal.  Left Ear: External ear normal.  Nose: Nose normal.  Mouth/Throat: Mucous membranes are dry.  Eyes: Conjunctivae and EOM are normal. Pupils are equal, round, and reactive to light.  Mild ptosis right eye  Neck: Normal range of motion. Neck supple.  Cardiovascular: Normal rate, regular rhythm, normal heart sounds and intact distal pulses.  Pulmonary/Chest: Effort normal and breath sounds normal.  Abdominal: Soft. Bowel sounds are normal.  Musculoskeletal: Normal range of motion.  Neurological: He appears lethargic.  Pt will awaken to voice and will say his name.  He knows  it is  November.  He is unclear on the day.  He knows he's at the hospital.  Pt very tangential with questions.  He will often not answer.  He is weak in both lower legs chronically.  Skin: Skin is warm and dry.  Psychiatric: His speech is delayed and tangential.  Nursing note and vitals reviewed.    ED Treatments / Results  Labs (all labs ordered are listed, but only abnormal results are displayed) Labs Reviewed  COMPREHENSIVE METABOLIC PANEL - Abnormal; Notable for the following components:      Result Value   Calcium 8.7 (*)    AST 88 (*)    Total Bilirubin 2.1 (*)    All other components within normal limits  CBC WITH DIFFERENTIAL/PLATELET - Abnormal; Notable for the following components:   WBC 13.3 (*)    RDW 17.1 (*)    Neutro Abs 10.2 (*)    Monocytes Absolute 1.1 (*)    All other components within normal limits  RAPID URINE DRUG SCREEN, HOSP PERFORMED - Abnormal; Notable for the following components:   Benzodiazepines POSITIVE (*)    All other components within normal limits  URINALYSIS, ROUTINE W REFLEX MICROSCOPIC - Abnormal; Notable for the following components:   APPearance CLOUDY (*)    Hgb urine dipstick LARGE (*)    Ketones, ur 20 (*)    Protein, ur 100 (*)    Bacteria, UA FEW (*)    Squamous Epithelial / LPF 0-5 (*)    All other components within normal limits  CK - Abnormal; Notable for the following components:   Total CK 3,532 (*)    All other components within normal limits  URINE CULTURE  ETHANOL  PROTIME-INR  CBG MONITORING, ED  I-STAT CG4 LACTIC ACID, ED    EKG  EKG Interpretation  Date/Time:  Thursday January 13 2017 16:44:47 EST Ventricular Rate:  70 PR Interval:    QRS Duration: 96 QT Interval:  499 QTC Calculation: 539 R Axis:   9 Text Interpretation:  Sinus rhythm Low voltage, precordial leads Borderline T wave abnormalities Prolonged QT interval Baseline wander in lead(s) I III aVR aVL aVF V6 Confirmed by Isla Pence [phone]) on  01/13/2017 5:44:19 PM       Radiology Ct Head Wo Contrast  Result Date: 01/13/2017 CLINICAL DATA:  Altered mental status, unwitnessed fall EXAM: CT HEAD WITHOUT CONTRAST TECHNIQUE: Contiguous axial images were obtained from the base of the skull through the vertex without intravenous contrast. COMPARISON:  12/17/2016 FINDINGS: Brain: No acute territorial infarction, hemorrhage or intracranial mass is visualized. 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 Mild to moderate small vessel ischemic changes of the white matter. Atrophy. Stable ventricle size Vascular: No hyperdense vessels.  Carotid artery calcification Skull: No fracture Sinuses/Orbits: Mild mucosal thickening in the ethmoid sinuses. No acute orbital abnormality. Other: None IMPRESSION: 1. No CT evidence for acute intracranial abnormality. 2. Atrophy and small vessel ischemic changes of the white matter Electronically Signed   By: Donavan Foil M.D.   On: 01/13/2017 18:17   Dg Chest Port 1 View  Result Date: 01/13/2017 CLINICAL DATA:  Mental status changes. EXAM: PORTABLE CHEST 1 VIEW COMPARISON:  12/17/2016 FINDINGS: 1712 hours. Low lung volumes. There is pulmonary vascular congestion without overt pulmonary edema. The lungs are clear without focal pneumonia, edema, pneumothorax or pleural effusion. Similar appearance atelectasis at the left base. The cardio pericardial silhouette is enlarged. The visualized bony structures of the thorax are intact. Telemetry leads overlie the chest. IMPRESSION:  1. Low volume film with cardiomegaly and vascular congestion. 2. Similar appearance left base atelectasis. Electronically Signed   By: Misty Stanley M.D.   On: 01/13/2017 17:40    Procedures Procedures (including critical care time)  Medications Ordered in ED Medications  0.9 %  sodium chloride infusion ( Intravenous New Bag/Given 01/13/17 1841)     Initial Impression / Assessment and Plan / ED Course  I have reviewed the triage vital signs and  the nursing notes.  Pertinent labs & imaging results that were available during my care of the patient were reviewed by me and considered in my medical decision making (see chart for details).    Pt is still confused.  Unclear etiology.  Medications?  Per EMS, pt lives alone.  He has not had any family here to visit to give any additional hx.  Pt d/w Dr. Maudie Mercury (triad) who will see pt and admit for observation.  Final Clinical Impressions(s) / ED Diagnoses   Final diagnoses:  Encephalopathy acute    ED Discharge Orders    None       Isla Pence, MD 01/13/17 1944

## 2017-01-13 NOTE — H&P (Signed)
TRH H&P   Patient Demographics:    Patrick Holt, is a 66 y.o. male  MRN: [id number]   DOB - 1950-09-24  Admit Date - 01/13/2017  Outpatient Primary MD for the patient is [facility]  Kevan Ny  Referring MD/NP/PA: Isla Pence  Outpatient Specialists:   Patient coming from:  home  Chief Complaint  Patient presents with  . Altered Mental Status      HPI:    Patrick Holt  is a 66 y.o. male, w DM2, Copd, CAD, Pafib, CVA , seizure do, ? Rheumatoid arthritis/ sarcoid/ dermatomyositis, Fibromyalgia,  muscular dystrophy , was found down on ground today altered.  Pt states EMS was at his house last nite after he fell and  then came again today.  Pt was found in living room.  Seemed altered.  Pt isn"t much of a historian.  Pt is axox2 pt is not sure if he took any extra pills.    In ED,  CT brain   IMPRESSION: 1. No CT evidence for acute intracranial abnormality. 2. Atrophy and small vessel ischemic changes of the white matter  CXR  IMPRESSION: 1. Low volume film with cardiomegaly and vascular congestion. 2. Similar appearance left base atelectasis.   CPK 3,532 (prev 97)  Wbc 13.3, Hgb 14.2, Plt 251 Na 137, K 4.0,  Bun 18, Creatinine 1.05 Ast 88, Alt 40,  Alk phos 105, T. Bili 2.1 ua rbc 6-30  UDS benzo +  Pt will be admitted for altered mental status   Review of systems:    In addition to the HPI above,  No Fever-chills, No Headache, No changes with Vision or hearing, No problems swallowing food or Liquids, No Chest pain, Cough or Shortness of Breath, No Abdominal pain, No Nausea or Vommitting, Bowel movements are regular, No Blood in stool or Urine, No dysuria, No new skin rashes or bruises, No new joints pains-aches,  No new weakness, tingling, numbness in any extremity, No recent weight gain or loss, No polyuria,  polydypsia or polyphagia, No significant Mental Stressors.  A full 10 point Review of Systems was done, except as stated above, all other Review of Systems were negative.   With Past History of the following :    Past Medical History:  Diagnosis Date  . Anemia   . Anxiety   . Asthma   . Atrial fibrillation ([place]) 01/2012   with RVR  . Bipolar 1 disorder ([place])   . Cancer ([place])   . Collagen vascular disease ([place])   . Conversion disorder 06/2010  . COPD (chronic obstructive pulmonary disease) ([place])   . Dermatomyositis ([place])   . Dermatomyositis ([place])   . Diverticulitis   . Diverticulosis of colon   . Dysrhythmia    "irregular" (11/15/2012)  . Esophageal dysmotility   . Esophageal stricture   . Fibromyalgia   . Gastritis   .  GERD (gastroesophageal reflux disease)   . Hand fracture, right    sept 13, 2017, still wearing splint as og 01-10-2017  . Headache(784.0)    "severe; get them often" (11/15/2012)  . Hiatal hernia   . History of narcotic addiction ([place])   . Hx of adenomatous colonic polyps   . Hyperlipidemia   . Hypertension   . Internal hemorrhoids   . Ischemic heart disease   . Major depression    with acute psychotic break in 06/2010  . Myocardial infarct ([place])    mulitple (1999, 2000, 2004)  . Narcotic dependence ([place])   . Nephrolithiasis   . Obesity   . OCD (obsessive compulsive disorder)   . Otosclerosis   . Paroxysmal A-fib ([place])   . Peripheral neuropathy   . Raynaud's disease   . Renal insufficiency   . Rheumatoid arthritis(714.0)   . Sarcoidosis   . Seizures ([place])   . Subarachnoid hemorrhage ([place]) 01/2012   with subdural  hematoma.   . Type II diabetes mellitus ([place])   . Urge incontinence   . Vertigo       Past Surgical History:  Procedure Laterality Date  . BACK SURGERY    . CARDIAC CATHETERIZATION    . CARPAL TUNNEL RELEASE Bilateral   . CATARACT EXTRACTION W/ INTRAOCULAR LENS IMPLANT Left   . COLONOSCOPY N/A 09/27/2012   Procedure:  COLONOSCOPY;  Surgeon: Lafayette Dragon, MD;  Location: [facility] END[facility]COPY;  Service: Endoscopy;  Laterality: N/A;  . ESOPHAGOGASTRODUODENOSCOPY N/A 09/27/2012   Procedure: ESOPHAGOGASTRODUODENOSCOPY (EGD);  Surgeon: Lafayette Dragon, MD;  Location: [facility];  Service: Endoscopy;  Laterality: N/A;  . KNEE ARTHROSCOPY W/ MENISCAL REPAIR Left 2009  . LUMBAR DISC SURGERY    . LYMPH NODE DISSECTION Right 1970's   "neck; dr thought I had Hodgkins; turned out to be sarcoidosis" (11/15/2012)  . squamous papilloma   2010   removed by Dr. Constance Holster ENT, noted on tongue  . TONSILLECTOMY        Social History:     Social History   Tobacco Use  . Smoking status: Former Smoker    Packs/day: 0.50    Years: 30.00    Pack years: 15.00    Types: Cigarettes    Last attempt to quit: 12/15/2014    Years since quitting: 2.0  . Smokeless tobacco: Never Used  . Tobacco comment: 3 cigs a day  Substance Use Topics  . Alcohol use: No    Alcohol/week: 0.0 oz    Comment: Alcohol stopped in September of 2014     Lives - at home by self  Mobility - walks by self   Family History :     Family History  Problem Relation Age of Onset  . Alcohol abuse Mother   . Depression Mother   . Heart disease Mother   . Diabetes Mother   . Stroke Mother   . Coronary artery disease Father   . Diabetes Other        1/2 brother  . Hepatitis Brother      Home Medications:   Prior to Admission medications   Medication Sig Start Date End Date Taking? Authorizing Provider  ALPRAZolam Duanne Moron) 1 MG tablet Take 1 mg by mouth 3 (three) times daily.    [provider]  amiodarone (PACERONE) 200 MG tablet Take 0.5 tablets (100 mg total) daily by mouth. 01/03/17   Fay Records, MD  amLODipine (NORVASC) 5 MG tablet Take 1 tablet (5 mg  total) by mouth daily. 11/05/15   [place] Bing, DO  aspirin EC 81 MG tablet Take 1 tablet (81 mg total) by mouth daily. 10/17/15   [name], MD  atorvastatin (LIPITOR) 40 MG tablet  Take 1 tablet (40 mg total) by mouth daily. Patient taking differently: Take 40 mg by mouth at bedtime.  10/17/15   [name], MD  budesonide-formoterol (SYMBICORT) 160-4.5 MCG/ACT inhaler Inhale 2 puffs into the lungs 2 (two) times daily. 11/22/16   Doreatha Lew, MD  divalproex (DEPAKOTE ER) 250 MG 24 hr tablet Take 3 tablets (750 mg total) by mouth daily. 12/15/15   [name], MD  esomeprazole (NEXIUM) 40 MG capsule Take 40 mg by mouth daily. 07/26/16   [provider]  ipratropium-albuterol (DUONEB) 0.5-2.5 (3) MG/3ML SOLN Inhale 3 mLs into the lungs every 6 (six) hours as needed. Patient taking differently: Inhale 3 mLs into the lungs every 6 (six) hours as needed (for breathing).  11/17/15   Elgergawy, Silver Huguenin, MD  levETIRAcetam (KEPPRA) 750 MG tablet Take 1 tablet (750 mg total) by mouth 2 (two) times daily. TAKE 1 TABLET(500 MG) BY MOUTH TWICE DAILY 11/22/16   Patrecia Pour, Christean Grief, MD  loperamide (IMODIUM) 2 MG capsule TAKE 1 TO 2 CAPSULES BY MOUTH 3 TIMES A DAY AS NEEDED FOR DIARRHEA Patient taking differently: Take 2 mg by mouth every 8 (eight) hours as needed for diarrhea or loose stools.  08/20/15   [name], MD  Multiple Vitamin (MULTIVITAMIN WITH MINERALS) TABS tablet Take 1 tablet by mouth daily. 10/17/15   [name], MD  oxybutynin (DITROPAN) 5 MG tablet Take 5 mg by mouth daily. 07/26/16   [provider]  Potassium Chloride ER 20 MEQ TBCR Take 1 tablet by mouth every other day.  11/29/14   [provider]  predniSONE (DELTASONE) 10 MG tablet Take 4 tablets for 3 days; Take 3 tablets for 4 days; Take 2 tablets for 3 days; Take 1 tablet for 4 days Patient not taking: Reported on 12/17/2016 11/22/16   Patrecia Pour, Christean Grief, MD  risperiDONE (RISPERDAL) 1 MG tablet Take 1 tablet (1 mg total) by mouth 2 (two) times daily. 12/15/15   [name], MD     Allergies:     Allergies  Allergen Reactions  . Immune Globulins Other (See Comments)     Acute renal failure  . Ciprofloxacin Swelling  . Flagyl [Metronidazole] Swelling  . Lisinopril Diarrhea  . Sulfa Antibiotics Other (See Comments)    blisters  . Morphine And Related     Does not help with pain  . Requip [Ropinirole Hcl]     constipation  . Betamethasone Dipropionate Other (See Comments)    Unknown  . Bupropion Hcl Other (See Comments)    Unknown  . Clobetasol Other (See Comments)    Unknown  . Codeine Other (See Comments)    Does not help with pain  . Escitalopram Oxalate Other (See Comments)    Unknown  . Fluoxetine Hcl Other (See Comments)    Unknown  . Furosemide Other (See Comments)    Unknown  . Meclizine Rash  . Paroxetine Other (See Comments)    Unknown  . Penicillins Other (See Comments)    Unknown reaction Has patient had a PCN reaction causing immediate rash, facial/tongue/throat swelling, SOB or lightheadedness with hypotension: unknown Has patient had a PCN reaction causing severe rash involving mucus membranes or skin necrosis: unknown Has patient  had a PCN reaction that required hospitalization unknown Has patient had a PCN reaction occurring within the last 10 years: unknown If all of the above answers are "NO", then may proceed with Cephalosporin use. Unknown Unknown reactio  . Tacrolimus Other (See Comments)    Unknown  . Tetanus Toxoid Other (See Comments)    Unknown  . Tuberculin Purified Protein Derivative Other (See Comments)    Unknown     Physical Exam:   Vitals  Blood pressure 120/66, pulse 79, temperature 99.1 F (37.3 C), temperature source Rectal, resp. rate (!) 23, height '5\' 7"'  (1.702 m), weight 102.1 kg (225 lb), SpO2 94 %.   1. General  lying in bed in NAD,   2. Normal affect and insight, Not Suicidal or Homicidal, Awake Alert, Oriented X 3.  3. No F.N deficits, ALL C.Nerves Intact, Strength 5/5 all 4 extremities, Sensation intact all 4 extremities, Plantars down going.  4. Ears and Eyes appear Normal,  Conjunctivae clear, PERRLA. Moist Oral Mucosa.  5. Supple Neck, No JVD, No cervical lymphadenopathy appriciated, No Carotid Bruits.  6. Symmetrical Chest wall movement, Good air movement bilaterally, CTAB.  7. RRR, No Gallops, Rubs or Murmurs, No Parasternal Heave.  8. Positive Bowel Sounds, Abdomen Soft, No tenderness, No organomegaly appriciated,No rebound -guarding or rigidity.  9.  No Cyanosis, Normal Skin Turgor, No Skin Rash or Bruise.  10. Good muscle tone,  joints appear normal , no effusions, Normal ROM.  11. No Palpable Lymph Nodes in Neck or Axillae    Data Review:    CBC Recent Labs  Lab 01/13/17 1800  WBC 13.3*  HGB 14.2  HCT 44.4  PLT 251  MCV 81.5  MCH 26.1  MCHC 32.0  RDW 17.1*  LYMPHSABS 2.0  MONOABS 1.1*  EOSABS 0.0  BASOSABS 0.0   ------------------------------------------------------------------------------------------------------------------  Chemistries  Recent Labs  Lab 01/13/17 1800  NA 137  K 4.0  CL 103  CO2 25  GLUCOSE 88  BUN 18  CREATININE 1.05  CALCIUM 8.7*  AST 88*  ALT 40  ALKPHOS 105  BILITOT 2.1*   ------------------------------------------------------------------------------------------------------------------ estimated creatinine clearance is 77.7 mL/min (by C-G formula based on SCr of 1.05 mg/dL). ------------------------------------------------------------------------------------------------------------------ No results for input(s): TSH, T4TOTAL, T3FREE, THYROIDAB in the last 72 hours.  Invalid input(s): FREET3  Coagulation profile Recent Labs  Lab 01/13/17 1800  INR 1.09   ------------------------------------------------------------------------------------------------------------------- No results for input(s): DDIMER in the last 72 hours. -------------------------------------------------------------------------------------------------------------------  Cardiac Enzymes No results for input(s): CKMB,  TROPONINI, MYOGLOBIN in the last 168 hours.  Invalid input(s): CK ------------------------------------------------------------------------------------------------------------------    Component Value Date/Time   BNP 28.4 07/31/2014 1927     ---------------------------------------------------------------------------------------------------------------  Urinalysis    Component Value Date/Time   COLORURINE YELLOW 01/13/2017 1758   APPEARANCEUR CLOUDY (A) 01/13/2017 1758   LABSPEC 1.020 01/13/2017 1758   PHURINE 6.0 01/13/2017 1758   GLUCOSEU NEGATIVE 01/13/2017 1758   HGBUR LARGE (A) 01/13/2017 1758   HGBUR negative 12/11/2009 1356   BILIRUBINUR NEGATIVE 01/13/2017 1758   BILIRUBINUR MODERATE 06/05/2015 1407   KETONESUR 20 (A) 01/13/2017 1758   PROTEINUR 100 (A) 01/13/2017 1758   UROBILINOGEN 2.0 06/05/2015 1407   UROBILINOGEN 1.0 12/30/2014 1830   NITRITE NEGATIVE 01/13/2017 1758   LEUKOCYTESUR NEGATIVE 01/13/2017 1758    ----------------------------------------------------------------------------------------------------------------   Imaging Results:    Ct Head Wo Contrast  Result Date: 01/13/2017 CLINICAL DATA:  Altered mental status, unwitnessed fall EXAM: CT HEAD WITHOUT CONTRAST TECHNIQUE: Contiguous axial images were obtained  from the base of the skull through the vertex without intravenous contrast. COMPARISON:  12/17/2016 FINDINGS: Brain: No acute territorial infarction, hemorrhage or intracranial mass is visualized. 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 Mild to moderate small vessel ischemic changes of the white matter. Atrophy. Stable ventricle size Vascular: No hyperdense vessels.  Carotid artery calcification Skull: No fracture Sinuses/Orbits: Mild mucosal thickening in the ethmoid sinuses. No acute orbital abnormality. Other: None IMPRESSION: 1. No CT evidence for acute intracranial abnormality. 2. Atrophy and small vessel ischemic changes of the white matter  Electronically Signed   By: Donavan Foil M.D.   On: 01/13/2017 18:17   Dg Chest Port 1 View  Result Date: 01/13/2017 CLINICAL DATA:  Mental status changes. EXAM: PORTABLE CHEST 1 VIEW COMPARISON:  12/17/2016 FINDINGS: 1712 hours. Low lung volumes. There is pulmonary vascular congestion without overt pulmonary edema. The lungs are clear without focal pneumonia, edema, pneumothorax or pleural effusion. Similar appearance atelectasis at the left base. The cardio pericardial silhouette is enlarged. The visualized bony structures of the thorax are intact. Telemetry leads overlie the chest. IMPRESSION: 1. Low volume film with cardiomegaly and vascular congestion. 2. Similar appearance left base atelectasis. Electronically Signed   By: Misty Stanley M.D.   On: 01/13/2017 17:40    NSR at 70, nl axis, nl int, no st-t change c/w ischemia   Assessment & Plan:    Principal Problem:   Altered mental status Active Problems:   Atrial fibrillation (HCC)   Seizures ([place])   Essential hypertension, benign    Fall ? Syncope ? Seizure ? benzo Tele Trop I q6h x3 MRI brain Carotid US Cardiac echo  AMS Check MRI brain Check b12, folate, esr ana, rpr, tsh  Seizure do Check EEG Cont keppra  Rhabdo/ dermatomyositis Check esr, ana, cpk Hydrate with ns iv HOLD LIPITOR Will need rheumatology evaluation f/u  Abnormal lft Check RUQ ultrasound Check ammonia level  Pafib Tele Cont amiodarone Cont aspirin Likely not on anticoagulation due to fall risk  CAD/ CVA Cont aspirin HOLD LIPITOR  Hypertension Cont amlodipine  Gerd/ esophageal stricture Cont nexium=> protonix Copd Cont symbicort        DVT Prophylaxis   Lovenox - SCDs   AM Labs Ordered, also please review Full Orders  Family Communication: Admission, patients condition and plan of care including tests being ordered have been discussed with the patient who indicate understanding and agree with the plan and Code  Status.  Code Status full code  Likely DC to  Home   Condition GUARDED    Consults called:   none  Admission status: observation  Time spent in minutes : 45   Jani Gravel M.D on 01/13/2017 at 7:48 PM  Between 7am to 7pm - Pager - [phone]  . After 7pm go to [email or web address] - password [facility]  [facility] - Office  [phone]

## 2017-01-14 ENCOUNTER — Observation Stay (HOSPITAL_BASED_OUTPATIENT_CLINIC_OR_DEPARTMENT_OTHER): Payer: Medicare Other

## 2017-01-14 ENCOUNTER — Encounter (HOSPITAL_COMMUNITY): Payer: Self-pay | Admitting: Certified Registered Nurse Anesthetist

## 2017-01-14 ENCOUNTER — Observation Stay (HOSPITAL_COMMUNITY): Payer: Medicare Other | Admitting: Certified Registered Nurse Anesthetist

## 2017-01-14 ENCOUNTER — Observation Stay (HOSPITAL_COMMUNITY): Payer: Medicare Other

## 2017-01-14 ENCOUNTER — Encounter (HOSPITAL_COMMUNITY)
Admission: EM | Disposition: A | Discharge status: Home / Self Care | Payer: Self-pay | Source: Home / Self Care | Attending: Emergency Medicine

## 2017-01-14 ENCOUNTER — Ambulatory Visit (HOSPITAL_COMMUNITY): Admission: RE | Admit: 2017-01-14 | Payer: Medicare Other | Source: Ambulatory Visit | Admitting: Internal Medicine

## 2017-01-14 DIAGNOSIS — I10 Essential (primary) hypertension: Secondary | ICD-10-CM

## 2017-01-14 DIAGNOSIS — T148XXA Other injury of unspecified body region, initial encounter: Secondary | ICD-10-CM | POA: Diagnosis not present

## 2017-01-14 DIAGNOSIS — R4182 Altered mental status, unspecified: Secondary | ICD-10-CM | POA: Diagnosis not present

## 2017-01-14 DIAGNOSIS — G40909 Epilepsy, unspecified, not intractable, without status epilepticus: Secondary | ICD-10-CM | POA: Diagnosis not present

## 2017-01-14 DIAGNOSIS — F250 Schizoaffective disorder, bipolar type: Secondary | ICD-10-CM | POA: Diagnosis not present

## 2017-01-14 DIAGNOSIS — I4891 Unspecified atrial fibrillation: Secondary | ICD-10-CM | POA: Diagnosis not present

## 2017-01-14 DIAGNOSIS — F319 Bipolar disorder, unspecified: Secondary | ICD-10-CM | POA: Diagnosis not present

## 2017-01-14 DIAGNOSIS — M6282 Rhabdomyolysis: Secondary | ICD-10-CM

## 2017-01-14 DIAGNOSIS — I350 Nonrheumatic aortic (valve) stenosis: Secondary | ICD-10-CM | POA: Diagnosis not present

## 2017-01-14 DIAGNOSIS — G9340 Encephalopathy, unspecified: Secondary | ICD-10-CM | POA: Diagnosis not present

## 2017-01-14 DIAGNOSIS — M3390 Dermatopolymyositis, unspecified, organ involvement unspecified: Secondary | ICD-10-CM | POA: Diagnosis not present

## 2017-01-14 DIAGNOSIS — I480 Paroxysmal atrial fibrillation: Secondary | ICD-10-CM | POA: Diagnosis not present

## 2017-01-14 DIAGNOSIS — R569 Unspecified convulsions: Secondary | ICD-10-CM | POA: Diagnosis not present

## 2017-01-14 HISTORY — DX: Unspecified fracture of right wrist and hand, initial encounter for closed fracture: S62.91XA

## 2017-01-14 LAB — URINE CULTURE: CULTURE: NO GROWTH

## 2017-01-14 LAB — VITAMIN B12: Vitamin B-12: 354 pg/mL (ref 180–914)

## 2017-01-14 LAB — CBC
HCT: 40.6 % (ref 39.0–52.0)
HEMOGLOBIN: 12.9 g/dL — AB (ref 13.0–17.0)
MCH: 26 pg (ref 26.0–34.0)
MCHC: 31.8 g/dL (ref 30.0–36.0)
MCV: 81.7 fL (ref 78.0–100.0)
PLATELETS: 253 10*3/uL (ref 150–400)
RBC: 4.97 MIL/uL (ref 4.22–5.81)
RDW: 17.2 % — ABNORMAL HIGH (ref 11.5–15.5)
WBC: 9.4 10*3/uL (ref 4.0–10.5)

## 2017-01-14 LAB — ECHOCARDIOGRAM COMPLETE
AO mean calculated velocity dopler: 198 cm/s
AOPV: 0.39 m/s
AV Area VTI index: 0.65 cm2/m2
AV Area mean vel: 1.47 cm2
AV Peak grad: 38 mmHg
AV VEL mean LVOT/AV: 0.39
AV peak Index: 0.63
AV pk vel: 308 cm/s
AVAREAMEANVIN: 0.63 cm2/m2
AVAREAVTI: 1.48 cm2
AVG: 19 mmHg
Ao-asc: 34 cm
CHL CUP AV VALUE AREA INDEX: 0.65
CHL CUP AV VEL: 1.51
CHL CUP DOP CALC LVOT VTI: 25.1 cm
CHL CUP MV DEC (S): 285
CHL CUP RV SYS PRESS: 75 mmHg
CHL CUP TV REG PEAK VELOCITY: 386 cm/s
E decel time: 285 msec
FS: 24 % — AB (ref 28–44)
HEIGHTINCHES: 67 in
IV/PV OW: 1.05
LA diam end sys: 47 mm
LADIAMINDEX: 2.01 cm/m2
LASIZE: 47 mm
LAVOL: 79.5 mL
LAVOLA4C: 66.1 mL
LAVOLIN: 34 mL/m2
LV PW d: 11.9 mm — AB (ref 0.6–1.1)
LVOT area: 3.8 cm2
LVOT peak grad rest: 6 mmHg
LVOT peak vel: 120 cm/s
LVOTD: 22 mm
LVOTSV: 95 mL
LVOTVTI: 0.4 cm
MV Peak grad: 5 mmHg
MV pk A vel: 127 m/s
MVPKEVEL: 116 m/s
TAPSE: 26.5 mm
TRMAXVEL: 386 cm/s
VTI: 63.3 cm
Valve area: 1.51 cm2
WEIGHTICAEL: 3915.37 [oz_av]

## 2017-01-14 LAB — GLUCOSE, CAPILLARY
GLUCOSE-CAPILLARY: 92 mg/dL (ref 65–99)
GLUCOSE-CAPILLARY: 126 mg/dL — AB (ref 65–99)
Glucose-Capillary: 68 mg/dL (ref 65–99)
Glucose-Capillary: 131 mg/dL — ABNORMAL HIGH (ref 65–99)
Glucose-Capillary: 108 mg/dL — ABNORMAL HIGH (ref 65–99)

## 2017-01-14 LAB — COMPREHENSIVE METABOLIC PANEL
ALK PHOS: 98 U/L (ref 38–126)
ALT: 38 U/L (ref 17–63)
ANION GAP: 11 (ref 5–15)
AST: 82 U/L — ABNORMAL HIGH (ref 15–41)
Albumin: 3.5 g/dL (ref 3.5–5.0)
BUN: 15 mg/dL (ref 6–20)
CALCIUM: 8.4 mg/dL — AB (ref 8.9–10.3)
CO2: 22 mmol/L (ref 22–32)
CREATININE: 1.06 mg/dL (ref 0.61–1.24)
Chloride: 107 mmol/L (ref 101–111)
Glucose, Bld: 79 mg/dL (ref 65–99)
Potassium: 3.4 mmol/L — ABNORMAL LOW (ref 3.5–5.1)
Sodium: 140 mmol/L (ref 135–145)
TOTAL PROTEIN: 6.4 g/dL — AB (ref 6.5–8.1)
Total Bilirubin: 1.4 mg/dL — ABNORMAL HIGH (ref 0.3–1.2)

## 2017-01-14 LAB — CK TOTAL AND CKMB (NOT AT ARMC)
CK TOTAL: 2833 U/L — AB (ref 49–397)
CK, MB: 30.4 ng/mL — ABNORMAL HIGH (ref 0.5–5.0)
Relative Index: 1.1 (ref 0.0–2.5)

## 2017-01-14 LAB — HEMOGLOBIN A1C
Hgb A1c MFr Bld: 5.9 % — ABNORMAL HIGH (ref 4.8–5.6)
Mean Plasma Glucose: 122.63 mg/dL

## 2017-01-14 LAB — TROPONIN I
TROPONIN I: 0.03 ng/mL — AB (ref <0.03)
Troponin I: <0.03 ng/mL (ref <0.03)
Troponin I: <0.03 ng/mL (ref <0.03)

## 2017-01-14 LAB — VALPROIC ACID LEVEL: Valproic Acid Lvl: <10 ug/mL — ABNORMAL LOW (ref 50.0–100.0)

## 2017-01-14 LAB — MRSA PCR SCREENING: MRSA by PCR: NEGATIVE

## 2017-01-14 LAB — SEDIMENTATION RATE: Sed Rate: 3 mm/hr (ref 0–16)

## 2017-01-14 LAB — AMMONIA: AMMONIA: 41 umol/L — AB (ref 9–35)

## 2017-01-14 LAB — TSH: TSH: 0.303 u[IU]/mL — AB (ref 0.350–4.500)

## 2017-01-14 SURGERY — CANCELLED PROCEDURE

## 2017-01-14 SURGERY — COLONOSCOPY WITH PROPOFOL
Anesthesia: Monitor Anesthesia Care

## 2017-01-14 MED ORDER — POTASSIUM CHLORIDE CRYS ER 20 MEQ PO TBCR
20.0000 meq | EXTENDED_RELEASE_TABLET | ORAL | Status: DC
  Administered 2017-01-14 – 2017-01-18 (×3): 20 meq via ORAL
  Filled 2017-01-14 (×5): qty 1

## 2017-01-14 MED ORDER — AMIODARONE HCL 200 MG PO TABS
100.0000 mg | ORAL_TABLET | Freq: Every day | ORAL | Status: DC
  Administered 2017-01-14 – 2017-01-19 (×6): 100 mg via ORAL
  Filled 2017-01-14 (×6): qty 1

## 2017-01-14 MED ORDER — ENOXAPARIN SODIUM 40 MG/0.4ML ~~LOC~~ SOLN
40.0000 mg | SUBCUTANEOUS | Status: DC
  Administered 2017-01-14 – 2017-01-19 (×6): 40 mg via SUBCUTANEOUS
  Filled 2017-01-14 (×6): qty 0.4

## 2017-01-14 MED ORDER — IPRATROPIUM-ALBUTEROL 0.5-2.5 (3) MG/3ML IN SOLN: 3.0000 mL | Freq: Four times a day (QID) | RESPIRATORY_TRACT | Status: DC | PRN

## 2017-01-14 MED ORDER — RISPERIDONE 0.5 MG PO TABS: 1.0000 mg | ORAL_TABLET | Freq: Two times a day (BID) | ORAL | Status: DC

## 2017-01-14 MED ORDER — DIVALPROEX SODIUM ER 500 MG PO TB24
750.0000 mg | ORAL_TABLET | Freq: Every day | ORAL | Status: DC
  Administered 2017-01-14 – 2017-01-19 (×6): 750 mg via ORAL
  Filled 2017-01-14 (×6): qty 1

## 2017-01-14 MED ORDER — SODIUM CHLORIDE 0.9 % IV SOLN
INTRAVENOUS | Status: AC
  Administered 2017-01-14: 1000 mL via INTRAVENOUS
  Administered 2017-01-14: 22:00:00 via INTRAVENOUS

## 2017-01-14 MED ORDER — LEVETIRACETAM 750 MG PO TABS
750.0000 mg | ORAL_TABLET | Freq: Two times a day (BID) | ORAL | Status: DC
  Administered 2017-01-14 – 2017-01-19 (×12): 750 mg via ORAL
  Filled 2017-01-14 (×12): qty 1

## 2017-01-14 MED ORDER — MOMETASONE FURO-FORMOTEROL FUM 200-5 MCG/ACT IN AERO
2.0000 | INHALATION_SPRAY | Freq: Two times a day (BID) | RESPIRATORY_TRACT | Status: DC
  Administered 2017-01-14 – 2017-01-19 (×9): 2 via RESPIRATORY_TRACT
  Filled 2017-01-14 (×2): qty 8.8

## 2017-01-14 MED ORDER — PROPOFOL 10 MG/ML IV BOLUS
INTRAVENOUS | Status: AC
  Filled 2017-01-14: qty 40

## 2017-01-14 MED ORDER — PANTOPRAZOLE SODIUM 40 MG PO TBEC
40.0000 mg | DELAYED_RELEASE_TABLET | Freq: Every day | ORAL | Status: DC
  Administered 2017-01-14 – 2017-01-19 (×6): 40 mg via ORAL
  Filled 2017-01-14 (×6): qty 1

## 2017-01-14 MED ORDER — ASPIRIN EC 81 MG PO TBEC
81.0000 mg | DELAYED_RELEASE_TABLET | Freq: Every day | ORAL | Status: DC
  Administered 2017-01-14 – 2017-01-19 (×6): 81 mg via ORAL
  Filled 2017-01-14 (×6): qty 1

## 2017-01-14 MED ORDER — POLYVINYL ALCOHOL 1.4 % OP SOLN
1.0000 [drp] | OPHTHALMIC | Status: DC | PRN
  Administered 2017-01-14 – 2017-01-15 (×3): 1 [drp] via OPHTHALMIC
  Filled 2017-01-14: qty 15

## 2017-01-14 MED ORDER — ADULT MULTIVITAMIN W/MINERALS CH
1.0000 | ORAL_TABLET | Freq: Every day | ORAL | Status: DC
  Administered 2017-01-14 – 2017-01-19 (×6): 1 via ORAL
  Filled 2017-01-14 (×6): qty 1

## 2017-01-14 MED ORDER — AMLODIPINE BESYLATE 5 MG PO TABS
5.0000 mg | ORAL_TABLET | Freq: Every day | ORAL | Status: DC
  Administered 2017-01-14 – 2017-01-19 (×6): 5 mg via ORAL
  Filled 2017-01-14 (×6): qty 1

## 2017-01-14 MED ORDER — LOPERAMIDE HCL 2 MG PO CAPS
2.0000 mg | ORAL_CAPSULE | Freq: Three times a day (TID) | ORAL | Status: DC | PRN
  Administered 2017-01-14: 2 mg via ORAL
  Filled 2017-01-14: qty 1

## 2017-01-14 MED ORDER — OXYBUTYNIN CHLORIDE 5 MG PO TABS
5.0000 mg | ORAL_TABLET | Freq: Every day | ORAL | Status: DC
  Administered 2017-01-14 – 2017-01-19 (×6): 5 mg via ORAL
  Filled 2017-01-14 (×6): qty 1

## 2017-01-14 SURGICAL SUPPLY — 24 items

## 2017-01-14 NOTE — Procedures (Signed)
History: 66 year old male found down  Sedation: None  Technique: This is a 21 channel routine scalp EEG performed at the bedside with bipolar and monopolar montages arranged in accordance to the international 10/20 system of electrode placement. One channel was dedicated to EKG recording.    Background: The background consists of intermixed alpha and beta activities. There is a well defined posterior dominant rhythm of 10 hz that attenuates with eye opening. Sleep is not recorded.  There is muscle and movement artifact during the majority of the study, but during the brief periods where the EEG is visible completely, it appears normal.  Photic stimulation: Physiologic driving is not performed  EEG Abnormalities: None  Clinical Interpretation: This normal EEG is recorded in the waking  state. There was no seizure or seizure predisposition recorded on this study. Please note that a normal EEG does not preclude the possibility of epilepsy.   Roland Rack, MD [facility] [phone]  If 7pm- 7am, please page neurology on call as listed in [place].

## 2017-01-14 NOTE — Progress Notes (Signed)
Patient brady, then afib, then flutter, now 1degree heart block. Paged [facility], Juanda Crumble returned page. Will continue to monitor this patient at the present.  Does have a history of AFib. Unknown whether he took his medication yesterday or not. CCMD on board.
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
## 2017-01-14 NOTE — Progress Notes (Signed)
Report received from [place], ED RN. Patient alert to self only. Will await arrival.

## 2017-01-14 NOTE — Progress Notes (Signed)
risperdal not given. Medication not verified by pharmacy.

## 2017-01-14 NOTE — Progress Notes (Signed)
CRITICAL VALUE ALERT  Critical Value:  Troponin 0.03  Date & Time Notied:  01/14/17 @ 0840  Provider Notified: Dr. Lonny Prude text paged.  Orders Received/Actions taken: Awaiting call back/orders.

## 2017-01-14 NOTE — Progress Notes (Signed)
EEG Completed; Results Pending  
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
## 2017-01-14 NOTE — Anesthesia Preprocedure Evaluation (Addendum)
Anesthesia Evaluation  Patient identified by MRN, date of birth, ID band Patient awake    Reviewed: Allergy & Precautions, NPO status , Patient's Chart, lab work & pertinent test results  Airway Mallampati: II  TM Distance: >3 FB Neck ROM: Full    Dental no notable dental hx.    Pulmonary neg pulmonary ROS, asthma , COPD, former smoker,    Pulmonary exam normal breath sounds clear to auscultation       Cardiovascular hypertension, + CAD, + Past MI and + Peripheral Vascular Disease  negative cardio ROS Normal cardiovascular exam+ dysrhythmias  Rhythm:Regular Rate:Normal     Neuro/Psych Seizures -, Well Controlled,  PSYCHIATRIC DISORDERS  Neuromuscular disease negative neurological ROS  negative psych ROS   GI/Hepatic negative GI ROS, Neg liver ROS, hiatal hernia, GERD  ,  Endo/Other  negative endocrine ROSdiabetes  Renal/GU Renal diseasenegative Renal ROS  negative genitourinary   Musculoskeletal negative musculoskeletal ROS (+)   Abdominal   Peds negative pediatric ROS (+)  Hematology negative hematology ROS (+)   Anesthesia Other Findings Echo 2/16  Study Conclusions  Left ventricle: Inferior and distal septal hypokinesis. The cavity size was mildly dilated. Wall thickness was increased in a pattern of mild LVH. The estimated ejection fraction was 50%. - Aortic valve: There was trivial regurgitation. - Mitral valve: Calcified annulus. - Left atrium: The atrium was mildly dilated. - Atrial septum: No defect or patent foramen ovale was identified. - Pulmonary arteries: PA peak pressure: 51 mm Hg (S).  Reproductive/Obstetrics negative OB ROS                            Anesthesia Physical Anesthesia Plan  ASA: IV  Anesthesia Plan: MAC   Post-op Pain Management:    Induction: Intravenous  PONV Risk Score and Plan: Treatment may vary due to age or medical condition  Airway  Management Planned: Mask, Natural Airway and Nasal Cannula  Additional Equipment:   Intra-op Plan:   Post-operative Plan:   Informed Consent:   Plan Discussed with:   Anesthesia Plan Comments:         Anesthesia Quick Evaluation

## 2017-01-14 NOTE — Care Management Obs Status (Signed)
[place] NOTIFICATION   Patient Details  Name: Patrick Holt MRN: [id number] Date of Birth: 1951/01/25   Medicare Observation Status Notification Given:  Yes    Carles Collet, RN 01/14/2017, 11:32 AM
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
## 2017-01-14 NOTE — Telephone Encounter (Signed)
Patient did not return call.

## 2017-01-14 NOTE — Progress Notes (Signed)
  Echocardiogram 2D Echocardiogram has been performed.  Randa Lynn Blaze Sandin 01/14/2017, 4:38 PM

## 2017-01-14 NOTE — Evaluation (Signed)
Occupational Therapy Evaluation Patient Details Name: Patrick Holt MRN: [id number] DOB: Mar 11, 1950 Today's Date: 01/14/2017    History of Present Illness 66 y.o. Male with a history of diabetes mellitus type 2, COPD, CAD, paroxysmal atrial fibrillation, CVA, seizure disorder, dermatomyositis, bipolar disorder.  He presented to the emergency department after being found down by EMS.   Clinical Impression   This 66 y/o M presents with the above. Difficult to obtain PLOF, though per chart review and Pt report, at baseline he is mod independent with ADLs and functional mobility, lives alone. Pt presents with back pain, decreased cognitive status impacting his ability to participate in mobility and functional completion. Pt required MaxA +2 for bed mobility with total assist provided for peri-care after incontinent BM. Pt perseverative throughout session and difficult to redirect, requiring max multimodal cues. Based upon Pt's current status feel he is unsafe to return home. Recommend ST rehab services in SNF setting after discharge to progress Pt's cognition, safety, and independence with ADLs and mobility.     Follow Up Recommendations  SNF;Supervision/Assistance - 24 hour    Equipment Recommendations  Other (comment)(defer to next venue )           Precautions / Restrictions Precautions Precautions: Fall Restrictions Weight Bearing Restrictions: No      Mobility Bed Mobility Overal bed mobility: Needs Assistance Bed Mobility: Rolling Rolling: Max assist;+2 for physical assistance;+2 for safety/equipment         General bed mobility comments: Pt requires MaxA +2 for rolling to L and R to complete peri-care; Pt with increased pain during mobility and declining further mobility when discussing EOB/OOB attempts; Pt also ultimately unsafe to attempt further mobility at this time due to current cognitive status                                                                     ADL either performed or assessed with clinical judgement   ADL Overall ADL's : Needs assistance/impaired Eating/Feeding: Minimal assistance;Bed level   Grooming: Moderate assistance;Bed level   Upper Body Bathing: Maximal assistance;Bed level   Lower Body Bathing: +2 for physical assistance;+2 for safety/equipment;Bed level;Total assistance   Upper Body Dressing : Maximal assistance;Bed level   Lower Body Dressing: Total assistance;+2 for physical assistance;+2 for safety/equipment;Bed level       Toileting- Clothing Manipulation and Hygiene: Total assistance;+2 for physical assistance;+2 for safety/equipment;Bed level Toileting - Clothing Manipulation Details (indicate cue type and reason): Pt noted to have incontinent BM, required total assist for peri care      Functional mobility during ADLs: Maximal assistance;+2 for physical assistance;+2 for safety/equipment General ADL Comments: Pt with decline in ADL/mobility status, suspect largely due to current cognitive status; required maxA +2 for bed mobility and total assist for peri-care at bed level      Vision   Additional Comments: Pt reports decreased vision in L eye; difficult to assess at this time due to Pt's cognitive impairments - to be further assessed                 Pertinent Vitals/Pain Pain Assessment: Faces Faces Pain Scale: Hurts whole lot Pain Location: back with bed mobility  Pain Descriptors / Indicators: Grimacing;Sore Pain Intervention(s): Limited activity within patient's  tolerance;Monitored during session;Repositioned     Hand Dominance     Extremity/Trunk Assessment Upper Extremity Assessment Upper Extremity Assessment: Difficult to assess due to impaired cognition(ROM appears [facility] )   Lower Extremity Assessment Lower Extremity Assessment: Defer to PT evaluation       Communication Communication Communication: No difficulties   Cognition Arousal/Alertness:  Awake/alert Behavior During Therapy: Restless;Anxious Overall Cognitive Status: Impaired/Different from baseline Area of Impairment: Attention;Awareness;Memory;Following commands;Safety/judgement                   Current Attention Level: Focused Memory: Decreased short-term memory Following Commands: Follows one step commands inconsistently Safety/Judgement: Decreased awareness of safety;Decreased awareness of deficits Awareness: Intellectual   General Comments: Pt perseverating about needing to call his bank as he is fearful someone has stolen his money, very difficult to redirect to answer any questions and requires multiple multimodal cues for following directions; Pt has wallet/items with him in room;  decreased safety awareness    General Comments                  Home Living Family/patient expects to be discharged to:: Skilled nursing facility Living Arrangements: Alone   Type of Home: Apartment                                  Prior Functioning/Environment Level of Independence: Independent with assistive device(s)        Comments: Pt lives alone, reports he uses cane and RW for mobility, completed ADLs without assist; difficulty obtaining home setup and PLOF due to current cognitive status         OT Problem List: Decreased strength;Pain;Decreased cognition;Decreased safety awareness      OT Treatment/Interventions: Self-care/ADL training;DME and/or AE instruction;Therapeutic activities;Balance training;Therapeutic exercise;Patient/family education;Energy conservation;Cognitive remediation/compensation    OT Goals(Current goals can be found in the care plan section) Acute Rehab OT Goals Patient Stated Goal: none stated at this time  OT Goal Formulation: Patient unable to participate in goal setting Time For Goal Achievement: 01/28/17 Potential to Achieve Goals: Good  OT Frequency: Min 2X/week                PT/OT/SLP  Co-Evaluation/Treatment: Yes Reason for Co-Treatment: Complexity of the patient's impairments (multi-system involvement);Necessary to address cognition/behavior during functional activity;For patient/therapist safety PT goals addressed during session: Mobility/safety with mobility OT goals addressed during session: ADL's and self-care      AM-PAC PT "6 Clicks" Daily Activity     Outcome Measure Help from another person eating meals?: A Little Help from another person taking care of personal grooming?: A Lot Help from another person toileting, which includes using toliet, bedpan, or urinal?: Total Help from another person bathing (including washing, rinsing, drying)?: A Lot Help from another person to put on and taking off regular upper body clothing?: A Lot Help from another person to put on and taking off regular lower body clothing?: Total 6 Click Score: 11   End of Session Nurse Communication: Mobility status  Activity Tolerance: Patient tolerated treatment well;Other (comment)(limited by cognitive status ) Patient left: in bed;with call bell/phone within reach;with bed alarm set  OT Visit Diagnosis: Other symptoms and signs involving cognitive function                Time: 4098-1191 OT Time Calculation (min): 28 min Charges:  OT General Charges $OT Visit: 1 Visit OT Evaluation $OT Eval Moderate Complexity: 1  Mod G-Codes: OT G-codes **NOT FOR INPATIENT CLASS** Functional Assessment Tool Used: AM-PAC 6 Clicks Daily Activity;Clinical judgement Functional Limitation: Self care Self Care Current Status (X5369): At least 80 percent but less than 100 percent impaired, limited or restricted Self Care Goal Status (Q2300): At least 40 percent but less than 60 percent impaired, limited or restricted   Lou Cal, OT Pager [phone] 01/14/2017   Raymondo Band 01/14/2017, 3:55 PM

## 2017-01-14 NOTE — Progress Notes (Signed)
Patient arrived to the floor alert and oriented to self and birthdate. Very confused, saying things that make no sense. Skin is bruised with multiple abrasions at different healing stages, scabs on knees bilaterally. Patient states "that's when I fell". His lower legs, bilaterally are reddened and warm to touch.Very difficult to do a neuro assessment on patient. 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 He is a poor historian. Bed alarm set, call bell in patient's hand. At this time, safety maintained.

## 2017-01-14 NOTE — Care Management Note (Signed)
Case Management Note  Patient Details  Name: Patrick Holt MRN: [id number] Date of Birth: [birth year]  Subjective/Objective:  Pt in with altered mental status. He is from home alone.   Pt has had frequent falls at home.             Action/Plan: Awaiting PT/OT recommendations. CM following for d/c needs, physician orders.  Expected Discharge Date:                  Expected Discharge Plan:     In-House Referral:     Discharge planning Services     Post Acute Care Choice:    Choice offered to:     DME Arranged:    DME Agency:     HH Arranged:    HH Agency:     Status of Service:  In process, will continue to follow  If discussed at Long Length of Stay Meetings, dates discussed:    Additional Comments:  Pollie Friar, RN 01/14/2017, 11:49 AM

## 2017-01-15 ENCOUNTER — Observation Stay (HOSPITAL_COMMUNITY): Payer: Medicare Other

## 2017-01-15 DIAGNOSIS — I2721 Secondary pulmonary arterial hypertension: Secondary | ICD-10-CM

## 2017-01-15 DIAGNOSIS — I4891 Unspecified atrial fibrillation: Secondary | ICD-10-CM | POA: Diagnosis not present

## 2017-01-15 DIAGNOSIS — I10 Essential (primary) hypertension: Secondary | ICD-10-CM | POA: Diagnosis not present

## 2017-01-15 DIAGNOSIS — R4182 Altered mental status, unspecified: Secondary | ICD-10-CM | POA: Diagnosis not present

## 2017-01-15 DIAGNOSIS — T148XXA Other injury of unspecified body region, initial encounter: Secondary | ICD-10-CM | POA: Diagnosis not present

## 2017-01-15 LAB — GLUCOSE, CAPILLARY
GLUCOSE-CAPILLARY: 87 mg/dL (ref 65–99)
GLUCOSE-CAPILLARY: 115 mg/dL — AB (ref 65–99)
Glucose-Capillary: 112 mg/dL — ABNORMAL HIGH (ref 65–99)

## 2017-01-15 LAB — CK: Total CK: 1119 U/L — ABNORMAL HIGH (ref 49–397)

## 2017-01-15 MED ORDER — NAPHAZOLINE-PHENIRAMINE 0.025-0.3 % OP SOLN
1.0000 [drp] | Freq: Four times a day (QID) | OPHTHALMIC | Status: DC | PRN
  Filled 2017-01-15: qty 5

## 2017-01-15 NOTE — Progress Notes (Signed)
Report received from Ginger. Patient remains confused, paranoid regarding his money, someone taking his money, wondering if checks could be cashed. He does have a history of bipolar and appears to be manic. Attempts at redirecting him is of little help. Safety maintained with bed alarm on.

## 2017-01-15 NOTE — Clinical Social Work Note (Signed)
Clinical Social Work Assessment  Patient Details  Name: Patrick Holt MRN: [id number] Date of Birth: 03/03/1950  Date of referral:  01/15/17               Reason for consult:  Facility Placement, Discharge Planning                Permission sought to share information with:  Facility Contact Representative Permission granted to share information::  Yes, Verbal Permission Granted  Name::        Agency::  SNF's  Relationship::     Contact Information:     Housing/Transportation Living arrangements for the past 2 months:  Independent Living Facility Source of Information:  Patient, Medical Team Patient Interpreter Needed:  None Criminal Activity/Legal Involvement Pertinent to Current Situation/Hospitalization:  No - Comment as needed Significant Relationships:  Friend Lives with:  Self Do you feel safe going back to the place where you live?  Yes Need for family participation in patient care:  Yes (Comment)  Care giving concerns:  PT recommending SNF once medically stable for discharge.   Social Worker assessment / plan:  CSW met with patient. No supports at bedside. CSW introduced role and explained that PT recommendations would be discussed. Patient gets off topic very easily and had to be redirected multiple times. The last time he was in a SNF was when he went to [facility]. He does not want to return. CSW will fax to all other facilities in the area. [place] website is down so unable to look it up. Patient cannot go to SNF until [facility] obtained. No further concerns. CSW encouraged patient to contact CSW as needed. CSW will continue to follow patient for support and facilitate discharge to SNF once medically stable.  Employment status:  Retired Insurance information:  Managed Medicare PT Recommendations:  Skilled Nursing Facility Information / Referral to community resources:  Skilled Nursing Facility  Patient/Family's Response to care:  Patient agreeable to SNF  placement. PT note states patient has no one he can trust. Patient appreciated social work intervention.  Patient/Family's Understanding of and Emotional Response to Diagnosis, Current Treatment, and Prognosis:  Patient has a good understanding of the reason for admission and his need for rehab prior to returning home. Patient appears pleased with hospital care.  Emotional Assessment Appearance:  Appears stated age Attitude/Demeanor/Rapport:  Other(Pleasant) Affect (typically observed):  Anxious, Frustrated Orientation:  Oriented to Self, Oriented to Place, Oriented to  Time, Oriented to Situation Alcohol / Substance use:  Never Used Psych involvement (Current and /or in the community):  No (Comment)  Discharge Needs  Concerns to be addressed:  Care Coordination Readmission within the last 30 days:  Yes Current discharge risk:  Dependent with Mobility, Lives alone Barriers to Discharge:  Awaiting State Approval ([place]), Continued Medical Work up    C , LCSW 01/15/2017, 12:51 PM  

## 2017-01-15 NOTE — Progress Notes (Addendum)
PROGRESS NOTE    Patrick Holt  PYK:[id number] DOB: 1950/06/06 DOA: 01/13/2017 PCP: Medicine, [facility]   Brief Narrative: Patrick Holt is a 66 y.o. Male with a history of diabetes mellitus type 2, COPD, CAD, paroxysmal atrial fibrillation, CVA, seizure disorder, dermatomyositis, bipolar disorder.  He presented to the emergency department after being found down by EMS.   Assessment & Plan:   Principal Problem:   Altered mental status Active Problems:   Atrial fibrillation (HCC)   Seizures (HCC)   Essential hypertension, benign   Found down Possibly secondary to seizure versus syncope.  Patient has not had a seizure medications for the past month per his report.  He states that these were stolen.  Evidence of rhabdomyolysis with elevated CK that is now trending down. Elevated troponin in setting of rhabdomyolysis and no chest pain. EKG similar to previous last month. Echo unremarkable except for severe PAH and grade 1 diastolic dysfunction.  Altered mental status Patient at baseline currently and possibly was altered secondary to postictal state.  Seizure disorder Possibly contributing to this admission.  Patient has not had his outpatient medications. EEG unremarkable. Depakote level subtherapeutic. -Continue Keppra and Depakote -Keppra level  Rhabdomyolysis Dermatomyositis Patient with a history of dermatomyositis currently does not follow-up with rheumatology secondary to being fired from multiple practices secondary to frequent no-shows.  Patient is not on therapy currently for his dermatomyositis.  He presented with elevated CK which is now trending down but could be likely secondary to rhabdomyolysis since he was found down by EMS. CK trended down -Continue to hold Lipitor  Abnormal AST Expected in the setting of rhabdomyolysis  Paroxysmal atrial fibrillation -Continue amiodarone  CAD History of CVA -Continue aspirin -Hold Lipitor in the  setting of elevated CK and AST  Essential hypertension -Continue amlodipine  GERD -Continue Protonix  COPD -Continue Symbicort  Hypokalemia Supplementation as needed  Bipolar disorder Appears slightly manic on exam, however, could be patient's baseline. -Continue Depakote  Pulmonary artery hypertension Patient will need to follow-up with cardiology as an outpatient.  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 -repeat x-ray, thumb spica and pending X-ray, consult orthopedic surgery while patient is in the hospital    DVT prophylaxis: Lovenox Code Status: Full code Family Communication: None at bedside Disposition Plan: Medically stable for discharge. Discharge to SNF when bed available   Consultants:   None  Procedures:   Echocardiogram (01/14/2017) Study Conclusions  - Left ventricle: The cavity size was normal. There was mild focal   basal hypertrophy of the septum. Systolic function was vigorous.   The estimated ejection fraction was in the range of 65% to 70%.   Wall motion was normal; there were no regional wall motion   abnormalities. Doppler parameters are consistent with abnormal   left ventricular relaxation (grade 1 diastolic dysfunction). - Aortic valve: Trileaflet; mildly thickened, mildly calcified   leaflets. There was moderate stenosis. Peak velocity (S): 308   cm/s. Mean gradient (S): 19 mm Hg. Valve area (VTI): 1.51 cm^2.   Valve area (Vmax): 1.48 cm^2. Valve area (Vmean): 1.47 cm^2. - Left atrium: The atrium was mildly dilated. - Right ventricle: The cavity size was mildly dilated. Wall   thickness was normal. - Pulmonary arteries: Systolic pressure was severely increased. PA   peak pressure: 75 mm Hg (S).  Antimicrobials:  None    Subjective: Right wrist pain.  Objective: Vitals:   01/14/17 2057 01/15/17  0133 01/15/17 0514 01/15/17 0948  BP: 127/60 125/67 106/63 (P)  128/77  Pulse: 66 65 67 (P) 62  Resp: 18 18 18  (P) 20  Temp: 99.2 F (37.3 C) 99.3 F (37.4 C) 98.8 F (37.1 C) (P) 98.2 F (36.8 C)  TempSrc: Oral Oral Oral (P) Oral  SpO2: 93% 93% 93% (P) 92%  Weight:      Height:        Intake/Output Summary (Last 24 hours) at 01/15/2017 1316 Last data filed at 01/15/2017 0948 Gross per 24 hour  Intake 1180 ml  Output -  Net 1180 ml   Filed Weights   01/13/17 1631 01/14/17 0100 01/14/17 0434  Weight: 102.1 kg (225 lb) 110.4 kg (243 lb 6.2 oz) 111 kg (244 lb 11.4 oz)    Examination:  General exam: Appears calm and comfortable Respiratory system: Clear to auscultation. Respiratory effort normal. Cardiovascular system: S1 & S2 heard, RRR. No murmurs, rubs, gallops or clicks. Gastrointestinal system: Abdomen is nondistended, soft and nontender. No organomegaly or masses felt. Normal bowel sounds heard. Central nervous system: Alert and oriented. No focal neurological deficits. Extremities: No edema. No calf tenderness Skin: No cyanosis. No rashes Psychiatry: Judgement and insight appear impaired. Anxious.     Data Reviewed: I have personally reviewed following labs and imaging studies  CBC: Recent Labs  Lab 01/13/17 1800 01/14/17 0614  WBC 13.3* 9.4  NEUTROABS 10.2*  --   HGB 14.2 12.9*  HCT 44.4 40.6  MCV 81.5 81.7  PLT 251 277   Basic Metabolic Panel: Recent Labs  Lab 01/13/17 1800 01/14/17 0614  NA 137 140  K 4.0 3.4*  CL 103 107  CO2 25 22  GLUCOSE 88 79  BUN 18 15  CREATININE 1.05 1.06  CALCIUM 8.7* 8.4*   GFR: Estimated Creatinine Clearance: 80.4 mL/min (by C-G formula based on SCr of 1.06 mg/dL). Liver Function Tests: Recent Labs  Lab 01/13/17 1800 01/14/17 0614  AST 88* 82*  ALT 40 38  ALKPHOS 105 98  BILITOT 2.1* 1.4*  PROT 6.9 6.4*  ALBUMIN 3.7 3.5   No results for input(s): LIPASE, AMYLASE in the last 168 hours. Recent Labs  Lab 01/14/17 0614  AMMONIA 41*   Coagulation Profile: Recent  Labs  Lab 01/13/17 1800  INR 1.09   Cardiac Enzymes: Recent Labs  Lab 01/13/17 1800 01/14/17 0614 01/14/17 1155 01/14/17 1809 01/15/17 0720  CKTOTAL 3,532* 2,833*  --   --  1,119*  CKMB  --  30.4*  --   --   --   TROPONINI  --  0.03* <0.03 <0.03  --    BNP (last 3 results) No results for input(s): PROBNP in the last 8760 hours. HbA1C: Recent Labs    01/14/17 0614  HGBA1C 5.9*   CBG: Recent Labs  Lab 01/14/17 0747 01/14/17 1123 01/14/17 1554 01/14/17 2217 01/15/17 1114  GLUCAP 92 131* 126* 108* 87   Lipid Profile: No results for input(s): CHOL, HDL, LDLCALC, TRIG, CHOLHDL, LDLDIRECT in the last 72 hours. Thyroid Function Tests: Recent Labs    01/14/17 0614  TSH 0.303*   Anemia Panel: Recent Labs    01/14/17 8242  VITAMINB12 354   Sepsis Labs: Recent Labs  Lab 01/13/17 1821  LATICACIDVEN 1.65    Recent Results (from the past 240 hour(s))  Urine culture     Status: None   Collection Time: 01/13/17  6:05 PM  Result Value Ref Range Status   Specimen Description URINE, CATHETERIZED  Final   Special Requests NONE  Final   Culture NO GROWTH  Final   Report Status 01/14/2017 FINAL  Final  MRSA PCR Screening     Status: None   Collection Time: 01/14/17  6:34 AM  Result Value Ref Range Status   MRSA by PCR NEGATIVE NEGATIVE Final    Comment:        The GeneXpert MRSA Assay (FDA approved for NASAL specimens only), is one component of a comprehensive MRSA colonization surveillance program. It is not intended to diagnose MRSA infection nor to guide or monitor treatment for MRSA infections.          Radiology Studies: Ct Head Wo Contrast  Result Date: 01/13/2017 CLINICAL DATA:  Altered mental status, unwitnessed fall EXAM: CT HEAD WITHOUT CONTRAST TECHNIQUE: Contiguous axial images were obtained from the base of the skull through the vertex without intravenous contrast. COMPARISON:  12/17/2016 FINDINGS: Brain: No acute territorial infarction,  hemorrhage or intracranial mass is visualized. 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 Mild to moderate small vessel ischemic changes of the white matter. Atrophy. Stable ventricle size Vascular: No hyperdense vessels.  Carotid artery calcification Skull: No fracture Sinuses/Orbits: Mild mucosal thickening in the ethmoid sinuses. No acute orbital abnormality. Other: None IMPRESSION: 1. No CT evidence for acute intracranial abnormality. 2. Atrophy and small vessel ischemic changes of the white matter Electronically Signed   By: Donavan Foil M.D.   On: 01/13/2017 18:17   Dg Chest Port 1 View  Result Date: 01/13/2017 CLINICAL DATA:  Mental status changes. EXAM: PORTABLE CHEST 1 VIEW COMPARISON:  12/17/2016 FINDINGS: 1712 hours. Low lung volumes. There is pulmonary vascular congestion without overt pulmonary edema. The lungs are clear without focal pneumonia, edema, pneumothorax or pleural effusion. Similar appearance atelectasis at the left base. The cardio pericardial silhouette is enlarged. The visualized bony structures of the thorax are intact. Telemetry leads overlie the chest. IMPRESSION: 1. Low volume film with cardiomegaly and vascular congestion. 2. Similar appearance left base atelectasis. Electronically Signed   By: Misty Stanley M.D.   On: 01/13/2017 17:40        Scheduled Meds: . amiodarone  100 mg Oral Daily  . amLODipine  5 mg Oral Daily  . aspirin EC  81 mg Oral Daily  . divalproex  750 mg Oral Daily  . enoxaparin (LOVENOX) injection  40 mg Subcutaneous Q24H  . insulin aspart  0-5 Units Subcutaneous QHS  . insulin aspart  0-9 Units Subcutaneous TID WC  . levETIRAcetam  750 mg Oral BID  . mometasone-formoterol  2 puff Inhalation BID  . multivitamin with minerals  1 tablet Oral Daily  . oxybutynin  5 mg Oral Daily  . pantoprazole  40 mg Oral Daily  . potassium chloride SA  20 mEq Oral QODAY   Continuous Infusions:    LOS: 0 days     Cordelia Poche, MD [facility] 01/15/2017, 1:16 PM Pager: [phone]  If 7PM-7AM, please contact night-coverage [email or web address] Password [email or web address] 01/15/2017, 1:16 PM

## 2017-01-15 NOTE — Progress Notes (Signed)
Went into patient's room, and sat down and let him talk.  He was much more clear, and articulated his fears regarding his money being gone and someone stealing his checks and medicine.  He is wanting someone to call Carin Hock, the lady who runs [place], and find out if someone has been in his apartment. Had a very lengthy discussion with patient. Emotional support given. Safety Maintained.

## 2017-01-15 NOTE — NC FL2 (Signed)
Opp LEVEL OF CARE SCREENING TOOL     IDENTIFICATION  Patient Name: Patrick Holt Birthdate: 1950-09-30 Sex: male Admission Date (Current Location): 01/13/2017  [facility] and [place] Number:  Herbalist and Address:  [facility], [place] [address], [place], [place] [address]      Provider Number: [id number]  Attending Physician Name and Address:  Mariel Aloe, MD  Relative Name and Phone Number:       Current Level of Care: Hospital Recommended Level of Care: [place] Prior Approval Number:    Date Approved/Denied:   PASRR Number: [place] Must website is down  Discharge Plan: SNF    Current Diagnoses: Patient Active Problem List   Diagnosis Date Noted  . Altered mental status 01/13/2017  . Low oxygen saturation   . Pain in right wrist 02/27/2016  . Altered mental state 11/14/2015  . Severe dehydration 11/14/2015  . Hypernatremia 11/14/2015  . Difficulty in walking, not elsewhere classified   . Right wrist fracture   . Closed displaced fracture of ulnar styloid with routine healing   . Scapholunate ligament injury with no instability   . Falls frequently   . Hypoxia 11/02/2015  . Anxiety 06/06/2015  . Impaired mobility 04/28/2015  . Lumbar spondylosis with myelopathy 03/07/2015  . Nocturia 12/03/2014  . Chronic venous insufficiency 09/23/2014  . Poor social situation 07/30/2014  . Schizophrenia, acute ([place])   . Schizoaffective disorder, unspecified type ([place])   . Decreased visual acuity 06/27/2014  . COPD exacerbation ([place])   . Right sided weakness 01/04/2014  . Essential hypertension, benign   . Atrial fibrillation, unspecified   . Radiculopathy of cervical region   . Polyuria 12/07/2013  . Erectile dysfunction 12/07/2013  . Diverticulitis 08/01/2013  . Acute diverticulitis 08/01/2013  . Hematochezia 06/04/2013  . Hypokalemia 10/03/2012  . Benign neoplasm of colon 09/27/2012  .  Stricture and stenosis of esophagus 09/27/2012  . Diverticulitis of colon without hemorrhage 07/26/2012  . Polypharmacy 03/26/2012  . Seizures ([place]) 02/10/2012  . Atrial fibrillation ([place]) 02/05/2012  . Coronary atherosclerosis of native coronary artery 02/05/2012  . Radicular low back pain 03/09/2011  . History of seizure disorder 01/29/2011  . Irritable bowel syndrome (IBS) 06/24/2010  . Urge incontinence 06/19/2010  . TOBACCO USER 11/23/2008  . DEPRESSION, MAJOR 05/31/2008  . ISCHEMIC HEART DISEASE 05/31/2008  . RAYNAUD'S DISEASE 05/31/2008  . HIATAL HERNIA 05/31/2008  . Rheumatoid arthritis ([place]) 05/31/2008  . FIBROMYALGIA 05/31/2008  . Hyperlipidemia 01/26/2008  . Anxiety state 01/26/2008  . HEMORRHOIDS, INTERNAL 01/26/2008  . COPD (chronic obstructive pulmonary disease) ([place]) 01/26/2008  . ESOPHAGEAL MOTILITY DISORDER 01/26/2008  . GERD 01/26/2008  . Dermatomyositis ([place]) 01/26/2008    Orientation RESPIRATION BLADDER Height & Weight     Self, Time, Situation, Place  Normal Incontinent Weight: 244 lb 11.4 oz (111 kg) Height:  5\' 7"  (170.2 cm)  BEHAVIORAL SYMPTOMS/MOOD NEUROLOGICAL BOWEL NUTRITION STATUS  (Anxious, restless, paranoid.) Convulsions/Seizures Continent Diet(Heart healthy/carb modified)  AMBULATORY STATUS COMMUNICATION OF NEEDS Skin   Extensive Assist Verbally Other (Comment)(MASD.)                       Personal Care Assistance Level of Assistance  Bathing, Feeding, Dressing Bathing Assistance: Maximum assistance Feeding assistance: Limited assistance Dressing Assistance: Maximum assistance     Functional Limitations Info  Sight, Hearing, Speech Sight Info: Adequate Hearing Info: Adequate Speech Info: Adequate    SPECIAL  CARE FACTORS FREQUENCY  PT (By licensed PT), Blood pressure, OT (By licensed OT)     PT Frequency: 5 x week OT Frequency: 5 x week            Contractures Contractures Info: Not present    Additional Factors Info   Code Status, Allergies, Psychotropic Code Status Info: Full Allergies Info: Immune Golbulins, Ciprofloxacin, Flagyl (Metronidazole), Lisinopril, Sulfa Antibiotics, Morphine And Related, Requip (Ropinirole Hcl), Betamethasone Dipropionate, Bupropion Hcl, Clobetasol, Codeine, Escitalopram Oxalate, Fluoxetine Hcl, Furosemide, Meclizine, Paroxetine, Penicillins, Tacrolimus, Tetanus Toxoid, Tuberculin Purified Protein Derivative Psychotropic Info: Anxiety, Depression, Schizophrenia: Depakote ER 750 mg PO daily, Xanax 1 mg PO TID prn.         Current Medications (01/15/2017):  This is the current hospital active medication list Current Facility-Administered Medications  Medication Dose Route Frequency Provider Last Rate Last Dose  . ALPRAZolam Duanne Moron) tablet 1 mg  1 mg Oral TID PRN Jani Gravel, MD   1 mg at 01/14/17 2149  . amiodarone (PACERONE) tablet 100 mg  100 mg Oral Daily Jani Gravel, MD   100 mg at 01/15/17 0925  . amLODipine (NORVASC) tablet 5 mg  5 mg Oral Daily Jani Gravel, MD   5 mg at 01/15/17 7035  . aspirin EC tablet 81 mg  81 mg Oral Daily Jani Gravel, MD   81 mg at 01/15/17 0093  . divalproex (DEPAKOTE ER) 24 hr tablet 750 mg  750 mg Oral Daily Jani Gravel, MD   750 mg at 01/15/17 8182  . enoxaparin (LOVENOX) injection 40 mg  40 mg Subcutaneous Q24H Jani Gravel, MD   40 mg at 01/14/17 1154  . insulin aspart (novoLOG) injection 0-5 Units  0-5 Units Subcutaneous QHS Jani Gravel, MD      . insulin aspart (novoLOG) injection 0-9 Units  0-9 Units Subcutaneous TID WC Jani Gravel, MD   1 Units at 01/14/17 1628  . ipratropium-albuterol (DUONEB) 0.5-2.5 (3) MG/3ML nebulizer solution 3 mL  3 mL Inhalation Q6H PRN Jani Gravel, MD      . levETIRAcetam (KEPPRA) tablet 750 mg  750 mg Oral BID Jani Gravel, MD   750 mg at 01/15/17 9937  . loperamide (IMODIUM) capsule 2 mg  2 mg Oral Q8H PRN Jani Gravel, MD   2 mg at 01/14/17 1458  . mometasone-formoterol (DULERA) 200-5 MCG/ACT inhaler 2 puff  2 puff Inhalation  BID Jani Gravel, MD   2 puff at 01/14/17 0904  . multivitamin with minerals tablet 1 tablet  1 tablet Oral Daily Jani Gravel, MD   1 tablet at 01/15/17 [id number]  . oxybutynin (DITROPAN) tablet 5 mg  5 mg Oral Daily Jani Gravel, MD   5 mg at 01/15/17 7893  . pantoprazole (PROTONIX) EC tablet 40 mg  40 mg Oral Daily Jani Gravel, MD   40 mg at 01/15/17 0925  . polyvinyl alcohol (LIQUIFILM TEARS) 1.4 % ophthalmic solution 1 drop  1 drop Both Eyes PRN Mariel Aloe, MD   1 drop at 01/15/17 0934  . potassium chloride SA (K-DUR,KLOR-CON) CR tablet 20 mEq  20 mEq Oral Oretha Milch, MD   20 mEq at 01/14/17 8101     Discharge Medications: Please see discharge summary for a list of discharge medications.  Relevant Imaging Results:  Relevant Lab Results:   Additional Information SS#: [id number]  Candie Chroman, LCSW

## 2017-01-15 NOTE — Progress Notes (Signed)
Patient still reports eyes itching, "feeling like they have sand in them", refusing the liquid tears, stating "they make it worse". Patient encouraged to bring this to MDs attention during rounds. A warm, damp washcloth was given to patient for comfort, which he reports being helpful. Report given to RN Tammy, and at bedside patient appeared to be agitated, wanting someone to help him comb his hair. He appears to get very emotional, dry tears, and easily frustrated. Emotional support given. Safety maintained.

## 2017-01-16 DIAGNOSIS — I10 Essential (primary) hypertension: Secondary | ICD-10-CM | POA: Diagnosis not present

## 2017-01-16 DIAGNOSIS — R4182 Altered mental status, unspecified: Secondary | ICD-10-CM

## 2017-01-16 DIAGNOSIS — I4891 Unspecified atrial fibrillation: Secondary | ICD-10-CM | POA: Diagnosis not present

## 2017-01-16 DIAGNOSIS — Z87891 Personal history of nicotine dependence: Secondary | ICD-10-CM | POA: Diagnosis not present

## 2017-01-16 DIAGNOSIS — I2721 Secondary pulmonary arterial hypertension: Secondary | ICD-10-CM

## 2017-01-16 DIAGNOSIS — Z811 Family history of alcohol abuse and dependence: Secondary | ICD-10-CM | POA: Diagnosis not present

## 2017-01-16 DIAGNOSIS — T148XXA Other injury of unspecified body region, initial encounter: Secondary | ICD-10-CM | POA: Diagnosis not present

## 2017-01-16 DIAGNOSIS — F319 Bipolar disorder, unspecified: Secondary | ICD-10-CM

## 2017-01-16 DIAGNOSIS — Z818 Family history of other mental and behavioral disorders: Secondary | ICD-10-CM | POA: Diagnosis not present

## 2017-01-16 LAB — GLUCOSE, CAPILLARY
Glucose-Capillary: 77 mg/dL (ref 65–99)
Glucose-Capillary: 76 mg/dL (ref 65–99)
Glucose-Capillary: 91 mg/dL (ref 65–99)
Glucose-Capillary: 135 mg/dL — ABNORMAL HIGH (ref 65–99)
Glucose-Capillary: 114 mg/dL — ABNORMAL HIGH (ref 65–99)

## 2017-01-16 NOTE — Progress Notes (Signed)
Patient continues to ruminate over his finances. He is very upset that he cannot talk to someone who can help him. Emotional support given. Patient does appear to be slightly clearer than when admitted.
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
## 2017-01-16 NOTE — Progress Notes (Signed)
PROGRESS NOTE    Patrick Holt  HWE:[id number] DOB: [birth year] DOA: 01/13/2017 PCP: Medicine, [facility]   Brief Narrative: Patrick Holt is a 66 y.o. Male with a history of diabetes mellitus type 2, COPD, CAD, paroxysmal atrial fibrillation, CVA, seizure disorder, dermatomyositis, bipolar disorder.  He presented to the emergency department after being found down by EMS.   Assessment & Plan:  Principal Problem:   Altered mental status Active Problems:   Atrial fibrillation (HCC)   Seizures (HCC)   Essential hypertension, benign   Pulmonary artery hypertension ([place])   Found down Possibly secondary to seizure versus syncope.  Patient has not had a seizure medications for the past month per his report.  He states that these were stolen.  Evidence of rhabdomyolysis with elevated CK that is now trending down. Elevated troponin in setting of rhabdomyolysis and no chest pain. EKG similar to previous last month. Echo unremarkable except for severe PAH and grade 1 diastolic dysfunction.  Altered mental status Patient at baseline currently and possibly was altered secondary to postictal state.  Seizure disorder Possibly contributing to this admission.  Patient has not had his outpatient medications. EEG unremarkable. Depakote level subtherapeutic. -Continue Keppra and Depakote -Keppra level  Rhabdomyolysis Dermatomyositis Patient with a history of dermatomyositis currently does not follow-up with rheumatology secondary to being fired from multiple practices secondary to frequent no-shows.  Patient is not on therapy currently for his dermatomyositis.  He presented with elevated CK which is now trending down but could be likely secondary to rhabdomyolysis since he was found down by EMS. CK trended down -Continue to hold Lipitor  Abnormal AST Expected in the setting of rhabdomyolysis  Paroxysmal atrial fibrillation -Continue amiodarone  CAD History of  CVA -Continue aspirin -Hold Lipitor in the setting of elevated CK and AST  Essential hypertension -Continue amlodipine  GERD -Continue Protonix  COPD -Continue Symbicort  Hypokalemia Supplementation as needed  Bipolar disorder Appears slightly manic on exam, however, could be patient's baseline. -Continue Depakote -Psych consult  Pulmonary artery hypertension Patient will need to follow-up with cardiology as an outpatient.  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 -thumb splint -ortho recs    DVT prophylaxis: Lovenox Code Status: Full code Family Communication: None at bedside Disposition Plan: Medically stable for discharge. Discharge to SNF when bed available   Consultants:   None  Procedures:   Echocardiogram (01/14/2017) Study Conclusions  - Left ventricle: The cavity size was normal. There was mild focal   basal hypertrophy of the septum. Systolic function was vigorous.   The estimated ejection fraction was in the range of 65% to 70%.   Wall motion was normal; there were no regional wall motion   abnormalities. Doppler parameters are consistent with abnormal   left ventricular relaxation (grade 1 diastolic dysfunction). - Aortic valve: Trileaflet; mildly thickened, mildly calcified   leaflets. There was moderate stenosis. Peak velocity (S): 308   cm/s. Mean gradient (S): 19 mm Hg. Valve area (VTI): 1.51 cm^2.   Valve area (Vmax): 1.48 cm^2. Valve area (Vmean): 1.47 cm^2. - Left atrium: The atrium was mildly dilated. - Right ventricle: The cavity size was mildly dilated. Wall   thickness was normal. - Pulmonary arteries: Systolic pressure was severely increased. PA   peak pressure: 75 mm Hg (S).  Antimicrobials:  None    Subjective: Right wrist pain.  Objective: Vitals:   01/15/17 1653 01/15/17 2032 01/15/17 2127 01/16/17 0450  BP: 114/67  119/65 (!) 144/94  Pulse: 73  68 73   Resp: 20  18 18   Temp: 98.7 F (37.1 C)  99.1 F (37.3 C) 98.8 F (37.1 C)  TempSrc: Oral  Oral Oral  SpO2: 90% 92% 91% 93%  Weight:      Height:        Intake/Output Summary (Last 24 hours) at 01/16/2017 1300 Last data filed at 01/16/2017 1002 Gross per 24 hour  Intake 360 ml  Output 1600 ml  Net -1240 ml   Filed Weights   01/13/17 1631 01/14/17 0100 01/14/17 0434  Weight: 102.1 kg (225 lb) 110.4 kg (243 lb 6.2 oz) 111 kg (244 lb 11.4 oz)    Examination:  General exam: Appears calm and comfortable Respiratory system: Clear to auscultation. Respiratory effort normal. Cardiovascular system: S1 & S2 heard, RRR. No murmurs, rubs, gallops or clicks. Gastrointestinal system: Abdomen is nondistended, soft and nontender. No organomegaly or masses felt. Normal bowel sounds heard. Central nervous system: Alert and oriented. No focal neurological deficits. Extremities: No edema. No calf tenderness Skin: No cyanosis. No rashes Psychiatry: Judgement and insight appear impaired. Anxious.     Data Reviewed: I have personally reviewed following labs and imaging studies  CBC: Recent Labs  Lab 01/13/17 1800 01/14/17 0614  WBC 13.3* 9.4  NEUTROABS 10.2*  --   HGB 14.2 12.9*  HCT 44.4 40.6  MCV 81.5 81.7  PLT 251 932   Basic Metabolic Panel: Recent Labs  Lab 01/13/17 1800 01/14/17 0614  NA 137 140  K 4.0 3.4*  CL 103 107  CO2 25 22  GLUCOSE 88 79  BUN 18 15  CREATININE 1.05 1.06  CALCIUM 8.7* 8.4*   GFR: Estimated Creatinine Clearance: 80.4 mL/min (by C-G formula based on SCr of 1.06 mg/dL). Liver Function Tests: Recent Labs  Lab 01/13/17 1800 01/14/17 0614  AST 88* 82*  ALT 40 38  ALKPHOS 105 98  BILITOT 2.1* 1.4*  PROT 6.9 6.4*  ALBUMIN 3.7 3.5   No results for input(s): LIPASE, AMYLASE in the last 168 hours. Recent Labs  Lab 01/14/17 0614  AMMONIA 41*   Coagulation Profile: Recent Labs  Lab 01/13/17 1800  INR 1.09   Cardiac Enzymes: Recent  Labs  Lab 01/13/17 1800 01/14/17 0614 01/14/17 1155 01/14/17 1809 01/15/17 0720  CKTOTAL 3,532* 2,833*  --   --  1,119*  CKMB  --  30.4*  --   --   --   TROPONINI  --  0.03* <0.03 <0.03  --    BNP (last 3 results) No results for input(s): PROBNP in the last 8760 hours. HbA1C: Recent Labs    01/14/17 0614  HGBA1C 5.9*   CBG: Recent Labs  Lab 01/15/17 1649 01/15/17 2132 01/16/17 0633 01/16/17 0743 01/16/17 1129  GLUCAP 112* 115* 77 76 91   Lipid Profile: No results for input(s): CHOL, HDL, LDLCALC, TRIG, CHOLHDL, LDLDIRECT in the last 72 hours. Thyroid Function Tests: Recent Labs    01/14/17 0614  TSH 0.303*   Anemia Panel: Recent Labs    01/14/17 3557  VITAMINB12 354   Sepsis Labs: Recent Labs  Lab 01/13/17 1821  LATICACIDVEN 1.65    Recent Results (from the past 240 hour(s))  Urine culture     Status: None   Collection Time: 01/13/17  6:05 PM  Result Value Ref Range Status   Specimen Description URINE, CATHETERIZED  Final   Special Requests NONE  Final   Culture NO GROWTH  Final   Report Status 01/14/2017 FINAL  Final  MRSA PCR Screening     Status: None   Collection Time: 01/14/17  6:34 AM  Result Value Ref Range Status   MRSA by PCR NEGATIVE NEGATIVE Final    Comment:        The GeneXpert MRSA Assay (FDA approved for NASAL specimens only), is one component of a comprehensive MRSA colonization surveillance program. It is not intended to diagnose MRSA infection nor to guide or monitor treatment for MRSA infections.          Radiology Studies: Dg Wrist Complete Right  Result Date: 01/15/2017 CLINICAL DATA:  Fall several days ago.  Pain. EXAM: RIGHT WRIST - COMPLETE 3+ VIEW COMPARISON:  12/27/2016 FINDINGS: Fracture again noted through the distal right first metacarpal with increasing angulation. Old healed distal radial and ulnar fractures with deformity. Widening of the scapholunate space compatible with dissociation, stable. No new  fracture. IMPRESSION: Increasing angulation through the scratched at increasing angulation at the distal right first metacarpal fracture. Scapholunate dissociation, stable. Electronically Signed   By: Rolm Baptise M.D.   On: 01/15/2017 13:22   Dg Hand Complete Right  Result Date: 01/15/2017 CLINICAL DATA:  Fall several days ago. EXAM: RIGHT HAND - COMPLETE 3+ VIEW COMPARISON:  12/27/2016 FINDINGS: Deformity in the distal radius and ulna from old injury. Angulated fracture through the right first metacarpal again noted with increasing angulation. No new fracture. Evidence of scapholunate dissociation with widening, stable. IMPRESSION: Increasing angulation at the distal right first metacarpal fracture. stable scapholunate dissociation. Electronically Signed   By: Rolm Baptise M.D.   On: 01/15/2017 13:21        Scheduled Meds: . amiodarone  100 mg Oral Daily  . amLODipine  5 mg Oral Daily  . aspirin EC  81 mg Oral Daily  . divalproex  750 mg Oral Daily  . enoxaparin (LOVENOX) injection  40 mg Subcutaneous Q24H  . insulin aspart  0-5 Units Subcutaneous QHS  . insulin aspart  0-9 Units Subcutaneous TID WC  . levETIRAcetam  750 mg Oral BID  . mometasone-formoterol  2 puff Inhalation BID  . multivitamin with minerals  1 tablet Oral Daily  . oxybutynin  5 mg Oral Daily  . pantoprazole  40 mg Oral Daily  . potassium chloride SA  20 mEq Oral QODAY   Continuous Infusions:    LOS: 0 days     Cordelia Poche, MD [facility] 01/16/2017, 1:00 PM Pager: [phone]  If 7PM-7AM, please contact night-coverage [email or web address] Password [email or web address] 01/16/2017, 1:00 PM

## 2017-01-16 NOTE — Progress Notes (Signed)
Pt very upset and agitated about finances. Says nobody is listening to him. Pt emotional support given.  Will continue to monitor.

## 2017-01-16 NOTE — Clinical Social Work Note (Signed)
[facility] screen submitted and is under manual review.  Dayton Scrape, [place]

## 2017-01-16 NOTE — Progress Notes (Signed)
Orthopedic Tech Progress Note Patient Details:  Patrick Holt 1950-11-06 [id number]  Ortho Devices Type of Ortho Device: Thumb velcro splint Ortho Device/Splint Interventions: Veleta Miners 01/16/2017, 10:57 AM

## 2017-01-17 DIAGNOSIS — I10 Essential (primary) hypertension: Secondary | ICD-10-CM | POA: Diagnosis not present

## 2017-01-17 DIAGNOSIS — I4891 Unspecified atrial fibrillation: Secondary | ICD-10-CM | POA: Diagnosis not present

## 2017-01-17 DIAGNOSIS — T148XXA Other injury of unspecified body region, initial encounter: Secondary | ICD-10-CM | POA: Diagnosis not present

## 2017-01-17 DIAGNOSIS — R4182 Altered mental status, unspecified: Secondary | ICD-10-CM | POA: Diagnosis not present

## 2017-01-17 LAB — LEVETIRACETAM LEVEL: Levetiracetam Lvl: 15 ug/mL (ref 10.0–40.0)

## 2017-01-17 LAB — GLUCOSE, CAPILLARY
GLUCOSE-CAPILLARY: 80 mg/dL (ref 65–99)
Glucose-Capillary: 106 mg/dL — ABNORMAL HIGH (ref 65–99)
Glucose-Capillary: 78 mg/dL (ref 65–99)
Glucose-Capillary: 118 mg/dL — ABNORMAL HIGH (ref 65–99)

## 2017-01-17 MED ORDER — MEPERIDINE HCL 25 MG/ML IJ SOLN: 6.2500 mg | INTRAMUSCULAR | Status: DC | PRN

## 2017-01-17 MED ORDER — FENTANYL CITRATE (PF) 100 MCG/2ML IJ SOLN: 25.0000 ug | INTRAMUSCULAR | Status: DC | PRN

## 2017-01-17 MED ORDER — POLYVINYL ALCOHOL 1.4 % OP SOLN
1.0000 [drp] | Freq: Four times a day (QID) | OPHTHALMIC | Status: DC | PRN
  Administered 2017-01-17 – 2017-01-18 (×2): 1 [drp] via OPHTHALMIC
  Filled 2017-01-17: qty 15

## 2017-01-17 MED ORDER — HYPROMELLOSE (GONIOSCOPIC) 2.5 % OP SOLN
1.0000 [drp] | Freq: Four times a day (QID) | OPHTHALMIC | Status: DC | PRN
  Filled 2017-01-17: qty 15

## 2017-01-17 NOTE — Consult Note (Signed)
Orthopaedics  I was called to review images of hand. Patient is followed by Dr. Burney Gauze as outpatient for right 1st metacarpal neck fracture. Appears subacute with some increase displacement. Recommend thumb spica splint and outpatient follow up with Dr. Burney Gauze. If admission is prolonged would recommend discussing case with Dr. Burney Gauze during the day this week.  Shona Needles, MD [facility] [phone] (phone)

## 2017-01-17 NOTE — Progress Notes (Signed)
Occupational Therapy Treatment Patient Details Name: ROYLEE CHAFFIN MRN: [id number] DOB: 03/29/1950 Today's Date: 01/17/2017    History of present illness 66 y.o. Male with a history of diabetes mellitus type 2, COPD, CAD, paroxysmal atrial fibrillation, CVA, seizure disorder, dermatomyositis, bipolar disorder.  He presented to the emergency department after being found down by EMS. Pt found to have AMS as well.    OT comments  Pt seen today for ADL retraining session to include self feeding/eating, grooming at bed level. He continues to demonstrate deficits in his ability to remain on task during ADL's and requires consistent/frequent multi-modal cues for redirection due to cognitive status. Demonstrates difficulty attending to task. He benefits from being handed items (comb, wash cloth, fork etc) vs "can you wash your face?" as he is noted to perseverate and go off task at hand. D/c plan is appropriate.   Follow Up Recommendations  SNF;Supervision/Assistance - 24 hour    Equipment Recommendations  Other (comment)(Defer to next venue)    Recommendations for Other Services      Precautions / Restrictions Precautions Precautions: Fall       Mobility Bed Mobility Overal bed mobility: Needs Assistance Bed Mobility: (Scoot up in bed for eating) Rolling: Max assist;+2 for physical assistance;+2 for safety/equipment            Transfers                      Balance Overall balance assessment: History of Falls                                         ADL either performed or assessed with clinical judgement   ADL Overall ADL's : Needs assistance/impaired Eating/Feeding: Minimal assistance;Bed level Eating/Feeding Details (indicate cue type and reason): Assistance to open packages, drinks, cut food, set-up tray and Mod-Max redirection to task.  Grooming: Wash/dry hands;Wash/dry face;Brushing hair;Minimal assistance;Bed level Grooming Details  (indicate cue type and reason): Pt performed grooming sitting up in bed today with Min-Mod VC's. Pt does well if wash cloth or comb is handed to him (if asked "can you wash your face" or "comb your hair?" pt was noted to go off on a tangent and perserverates about various issues, requires consistent re-direction to tasks at hand). Upper Body Bathing: Maximal assistance;Bed level                             General ADL Comments: Pt is currently +2 physical assist for bed mobility. Overall requires consistent re-direction to task and benefits from being handed objects (ie: comb, wash cloth, fork etc) rather than being asked. Pt was noted to perserverate throughout session on finances, his living situation and his brother whom does not speak to him "since the early 90's". Pt will require SNF/24/7 assist at this time.     Vision       Perception     Praxis      Cognition Arousal/Alertness: Awake/alert Behavior During Therapy: Restless;Anxious Overall Cognitive Status: No family/caregiver present to determine baseline cognitive functioning Area of Impairment: Attention;Awareness;Memory;Following commands;Safety/judgement;Problem solving                     Memory: Decreased short-term memory;Decreased recall of precautions Following Commands: Follows one step commands inconsistently Safety/Judgement: Decreased awareness of safety;Decreased awareness of deficits Awareness:  Intellectual Problem Solving: Slow processing;Decreased initiation;Difficulty sequencing;Requires verbal cues;Requires tactile cues General Comments: Pt perseverating about finances, his apartment and his brother that he has not seen/spoken to "since the early 90's" Extremely difficult to redirect to answer questions/participate in ADL retraining session and requires consistent  multiple multimodal cues for following directions.                                   Pertinent Vitals/ Pain        Pain Assessment: Faces Faces Pain Scale: No hurt Pain Intervention(s): Monitored during session  Home Living                                          Prior Functioning/Environment              Frequency  Min 2X/week        Progress Toward Goals  OT Goals(current goals can now be found in the care plan section)  Progress towards OT goals: Progressing toward goals  Acute Rehab OT Goals Patient Stated Goal: None stated at this time  OT Goal Formulation: Patient unable to participate in goal setting  Plan Discharge plan remains appropriate    Co-evaluation                 AM-PAC PT "6 Clicks" Daily Activity     Outcome Measure   Help from another person eating meals?: A Little Help from another person taking care of personal grooming?: A Little Help from another person toileting, which includes using toliet, bedpan, or urinal?: Total Help from another person bathing (including washing, rinsing, drying)?: A Lot Help from another person to put on and taking off regular upper body clothing?: A Lot Help from another person to put on and taking off regular lower body clothing?: Total 6 Click Score: 12    End of Session    OT Visit Diagnosis: Other symptoms and signs involving cognitive function   Activity Tolerance Patient tolerated treatment well;Other (comment)(Limited by cognitive status)   Patient Left in bed;with call bell/phone within reach;with bed alarm set   Nurse Communication          Time: [id number] OT Time Calculation (min): 51 min  Charges: OT General Charges $OT Visit: 1 Visit OT Treatments $Self Care/Home Management : 38-52 mins    Almyra Deforest, OTR/L 01/17/2017, 9:39 AM
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
## 2017-01-18 ENCOUNTER — Other Ambulatory Visit: Payer: Self-pay

## 2017-01-18 DIAGNOSIS — I10 Essential (primary) hypertension: Secondary | ICD-10-CM | POA: Diagnosis not present

## 2017-01-18 DIAGNOSIS — I4891 Unspecified atrial fibrillation: Secondary | ICD-10-CM | POA: Diagnosis not present

## 2017-01-18 DIAGNOSIS — Z818 Family history of other mental and behavioral disorders: Secondary | ICD-10-CM | POA: Diagnosis not present

## 2017-01-18 DIAGNOSIS — F250 Schizoaffective disorder, bipolar type: Secondary | ICD-10-CM

## 2017-01-18 DIAGNOSIS — Z87891 Personal history of nicotine dependence: Secondary | ICD-10-CM | POA: Diagnosis not present

## 2017-01-18 DIAGNOSIS — Z811 Family history of alcohol abuse and dependence: Secondary | ICD-10-CM | POA: Diagnosis not present

## 2017-01-18 DIAGNOSIS — R4182 Altered mental status, unspecified: Secondary | ICD-10-CM | POA: Diagnosis not present

## 2017-01-18 DIAGNOSIS — I2721 Secondary pulmonary arterial hypertension: Secondary | ICD-10-CM | POA: Diagnosis not present

## 2017-01-18 LAB — VALPROIC ACID LEVEL: VALPROIC ACID LVL: 39 ug/mL — AB (ref 50.0–100.0)

## 2017-01-18 LAB — COMPREHENSIVE METABOLIC PANEL
ALBUMIN: 3.2 g/dL — AB (ref 3.5–5.0)
ALT: 22 U/L (ref 17–63)
AST: 19 U/L (ref 15–41)
Alkaline Phosphatase: 82 U/L (ref 38–126)
Anion gap: 10 (ref 5–15)
BUN: 18 mg/dL (ref 6–20)
CHLORIDE: 107 mmol/L (ref 101–111)
CO2: 23 mmol/L (ref 22–32)
CREATININE: 0.84 mg/dL (ref 0.61–1.24)
Calcium: 8.7 mg/dL — ABNORMAL LOW (ref 8.9–10.3)
GFR calc Af Amer: >60 mL/min (ref >60)
GFR calc non Af Amer: >60 mL/min (ref >60)
Glucose, Bld: 90 mg/dL (ref 65–99)
POTASSIUM: 3.8 mmol/L (ref 3.5–5.1)
SODIUM: 140 mmol/L (ref 135–145)
Total Bilirubin: 0.8 mg/dL (ref 0.3–1.2)
Total Protein: 6.4 g/dL — ABNORMAL LOW (ref 6.5–8.1)

## 2017-01-18 LAB — GLUCOSE, CAPILLARY
GLUCOSE-CAPILLARY: 90 mg/dL (ref 65–99)
GLUCOSE-CAPILLARY: 108 mg/dL — AB (ref 65–99)
Glucose-Capillary: 105 mg/dL — ABNORMAL HIGH (ref 65–99)
Glucose-Capillary: 143 mg/dL — ABNORMAL HIGH (ref 65–99)
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
## 2017-01-18 NOTE — Discharge Summary (Signed)
Physician Discharge Summary  EMANUAL LAMOUNTAIN GGE:[id number] DOB: 1950-12-12 DOA: 01/13/2017  PCP: [facility]  Admit date: 01/13/2017 Discharge date: 01/19/2017  Admitted From: home Disposition: home  Recommendations for Outpatient Follow-up:  1. Follow up with PCP in 1 week 2. Please obtain BMP/CBC in one week 3. Please follow up on the following pending results:   Home Health: PT/OT, Nursing, Aide  Discharge Condition: stable CODE STATUS: full Diet recommendation: heart healthy carb modified.   Admission HPI written by Jani Gravel, MD   Hospital course:  Found down Possibly secondary to seizure versus syncope.  Patient has not had a seizure medications for the past month per his report.  He states that these were stolen.  Evidence of rhabdomyolysis with elevated CK that is now trending down. Elevated troponin in setting of rhabdomyolysis and no chest pain. EKG similar to previous last month. Echo unremarkable except for severe PAH and grade 1 diastolic dysfunction. Restarted seizure medications. Discharge with prescription.  Altered mental status Patient at baseline currently and possibly was altered secondary to postictal state. Resolved.  Seizure disorder Possibly contributing to this admission.  Patient has not had his outpatient medications. EEG unremarkable. Depakote level subtherapeutic. Keppra level therapeutic. Continue Keppra and Depakote on discharge. Follow-up with psych.  Rhabdomyolysis Dermatomyositis Patient with a history of dermatomyositis currently does not follow-up with rheumatology secondary to being fired from multiple practices secondary to frequent no-shows.  Patient is not on therapy currently for his dermatomyositis.  He presented with elevated CK which is now trending down but could be likely secondary to rhabdomyolysis since he was found down by EMS. CK trended down. Resume Lipitor on discharge.  Abnormal AST Expected in  the setting of rhabdomyolysis. Resolved.  Paroxysmal atrial fibrillation Continue amiodarone.  CAD History of CVA Continue aspirin. Held Lipitor secondary to rhabdomyolysis and elevated AST. Continue on discharge.  Essential hypertension Continued amlodipine  GERD Continued Protonix  COPD Continued Symbicort  Hypokalemia Supplementation as needed. Resolved.  Bipolar disorder Hypomanic per psychiatry evaluation. Continue Depakote. Patient with capacity.  Pulmonary artery hypertension Patient will need to follow-up with cardiology as an outpatient.  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 Follow-up with Dr. Burney Gauze as an outpatient. Continue splint.    Discharge Diagnoses:  Principal Problem:   Schizoaffective disorder, bipolar type ([place]) Active Problems:   Atrial fibrillation ([place])   Seizures ([place])   Essential hypertension, benign   Altered mental status   Pulmonary artery hypertension [facility])    Discharge Instructions  Discharge Instructions    Diet - low sodium heart healthy   Complete by:  As directed    Increase activity slowly   Complete by:  As directed      Allergies as of 01/19/2017      Reactions   Immune Globulins Other (See Comments)   Acute renal failure   Ciprofloxacin Swelling   Flagyl [metronidazole] Swelling   Lisinopril Diarrhea   Sulfa Antibiotics Other (See Comments)   blisters   Morphine And Related    Does not help with pain   Requip [ropinirole Hcl]    constipation   Betamethasone Dipropionate Other (See Comments)   Unknown   Bupropion Hcl Other (See Comments)   Unknown   Clobetasol Other (See Comments)   Unknown   Codeine Other (See Comments)   Does not help with pain   Escitalopram Oxalate Other (See Comments)   Unknown   Fluoxetine Hcl  Other (See Comments)   Unknown   Furosemide Other (See Comments)   Unknown   Meclizine Rash   Paroxetine Other  (See Comments)   Unknown   Penicillins Other (See Comments)   Unknown reaction Has patient had a PCN reaction causing immediate rash, facial/tongue/throat swelling, SOB or lightheadedness with hypotension: unknown Has patient had a PCN reaction causing severe rash involving mucus membranes or skin necrosis: unknown Has patient had a PCN reaction that required hospitalization unknown Has patient had a PCN reaction occurring within the last 10 years: unknown If all of the above answers are "NO", then may proceed with Cephalosporin use. Unknown Unknown reactio   Tacrolimus Other (See Comments)   Unknown   Tetanus Toxoid Other (See Comments)   Unknown   Tuberculin Purified Protein Derivative Other (See Comments)   Unknown      Medication List    STOP taking these medications   loperamide 2 MG capsule Commonly known as:  IMODIUM   predniSONE 10 MG tablet Commonly known as:  DELTASONE   risperiDONE 1 MG tablet Commonly known as:  RISPERDAL     TAKE these medications   ALPRAZolam 1 MG tablet Commonly known as:  XANAX Take 1 mg by mouth 3 (three) times daily.   amiodarone 200 MG tablet Commonly known as:  PACERONE Take 0.5 tablets (100 mg total) by mouth daily.   amLODipine 5 MG tablet Commonly known as:  NORVASC Take 1 tablet (5 mg total) by mouth daily.   aspirin EC 81 MG tablet Take 1 tablet (81 mg total) by mouth daily.   atorvastatin 40 MG tablet Commonly known as:  LIPITOR Take 1 tablet (40 mg total) by mouth daily. What changed:  when to take this   budesonide-formoterol 160-4.5 MCG/ACT inhaler Commonly known as:  SYMBICORT Inhale 2 puffs into the lungs 2 (two) times daily.   divalproex 250 MG 24 hr tablet Commonly known as:  DEPAKOTE ER Take 3 tablets (750 mg total) by mouth daily.   esomeprazole 40 MG capsule Commonly known as:  NEXIUM Take 40 mg by mouth daily.   ipratropium-albuterol 0.5-2.5 (3) MG/3ML Soln Commonly known as:  DUONEB Inhale 3 mLs  into the lungs every 6 (six) hours as needed. What changed:  reasons to take this   levETIRAcetam 750 MG tablet Commonly known as:  KEPPRA Take 1 tablet (750 mg total) by mouth 2 (two) times daily. What changed:  additional instructions   multivitamin with minerals Tabs tablet Take 1 tablet by mouth daily.   oxybutynin 5 MG tablet Commonly known as:  DITROPAN Take 5 mg by mouth daily.   Potassium Chloride ER 20 MEQ Tbcr Take 1 tablet by mouth every other day.      Follow-up Information    [facility]. Schedule an appointment as soon as possible for a visit in 1 week(s).   Why:  Depakote level, metabolic panel, hospital follow-up Contact information: [place] [address] [phone]        [facility]. Schedule an appointment as soon as possible for a visit in 2 week(s).   Specialty:  Neurology Why:  Seizure disorder Contact information: [place] [phone]         Allergies  Allergen Reactions  . Immune Globulins Other (See Comments)    Acute renal failure  . Ciprofloxacin Swelling  . Flagyl [Metronidazole] Swelling  . Lisinopril Diarrhea  . Sulfa  Antibiotics Other (See Comments)    blisters  . Morphine And Related     Does not help with pain  . Requip [Ropinirole Hcl]     constipation  . Betamethasone Dipropionate Other (See Comments)    Unknown  . Bupropion Hcl Other (See Comments)    Unknown  . Clobetasol Other (See Comments)    Unknown  . Codeine Other (See Comments)    Does not help with pain  . Escitalopram Oxalate Other (See Comments)    Unknown  . Fluoxetine Hcl Other (See Comments)    Unknown  . Furosemide Other (See Comments)    Unknown  . Meclizine Rash  . Paroxetine Other (See Comments)    Unknown  . Penicillins Other (See Comments)    Unknown reaction Has patient had a PCN reaction causing immediate rash,  facial/tongue/throat swelling, SOB or lightheadedness with hypotension: unknown Has patient had a PCN reaction causing severe rash involving mucus membranes or skin necrosis: unknown Has patient had a PCN reaction that required hospitalization unknown Has patient had a PCN reaction occurring within the last 10 years: unknown If all of the above answers are "NO", then may proceed with Cephalosporin use. Unknown Unknown reactio  . Tacrolimus Other (See Comments)    Unknown  . Tetanus Toxoid Other (See Comments)    Unknown  . Tuberculin Purified Protein Derivative Other (See Comments)    Unknown    Consultations:  Psych   Procedures/Studies: Dg Wrist Complete Right  Result Date: 01/15/2017 CLINICAL DATA:  Fall several days ago.  Pain. EXAM: RIGHT WRIST - COMPLETE 3+ VIEW COMPARISON:  12/27/2016 FINDINGS: Fracture again noted through the distal right first metacarpal with increasing angulation. Old healed distal radial and ulnar fractures with deformity. Widening of the scapholunate space compatible with dissociation, stable. No new fracture. IMPRESSION: Increasing angulation through the scratched at increasing angulation at the distal right first metacarpal fracture. Scapholunate dissociation, stable. Electronically Signed   By: Rolm Baptise M.D.   On: 01/15/2017 13:22   Dg Wrist Complete Right  Result Date: 12/27/2016 CLINICAL DATA:  Fall, wrist and thumb pain. EXAM: RIGHT WRIST - COMPLETE 3+ VIEW COMPARISON:  11/20/2016 FINDINGS: There is a fracture through the right first distal metacarpal with mild angulation. Old healed distal radial fracture noted. There is widening of the scapholunate distance, increasing since prior study, now on 7 mm compared to 5 mm previously compatible with scapholunate dissociation. Old ulnar styloid fracture noted. IMPRESSION: Angulated distal right first metacarpal fracture. Scapholunate dissociation, worsening since prior study. Old distal radial and ulnar  styloid fractures. Electronically Signed   By: Rolm Baptise M.D.   On: 12/27/2016 08:03   Ct Head Wo Contrast  Result Date: 01/13/2017 CLINICAL DATA:  Altered mental status, unwitnessed fall EXAM: CT HEAD WITHOUT CONTRAST TECHNIQUE: Contiguous axial images were obtained from the base of the skull through the vertex without intravenous contrast. COMPARISON:  12/17/2016 FINDINGS: Brain: No acute territorial infarction, hemorrhage or intracranial mass is visualized. 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 Mild to moderate small vessel ischemic changes of the white matter. Atrophy. Stable ventricle size Vascular: No hyperdense vessels.  Carotid artery calcification Skull: No fracture Sinuses/Orbits: Mild mucosal thickening in the ethmoid sinuses. No acute orbital abnormality. Other: None IMPRESSION: 1. No CT evidence for acute intracranial abnormality. 2. Atrophy and small vessel ischemic changes of the white matter Electronically Signed   By: Donavan Foil M.D.   On: 01/13/2017 18:17   Dg Chest Port 1  View  Result Date: 01/13/2017 CLINICAL DATA:  Mental status changes. EXAM: PORTABLE CHEST 1 VIEW COMPARISON:  12/17/2016 FINDINGS: 1712 hours. Low lung volumes. There is pulmonary vascular congestion without overt pulmonary edema. The lungs are clear without focal pneumonia, edema, pneumothorax or pleural effusion. Similar appearance atelectasis at the left base. The cardio pericardial silhouette is enlarged. The visualized bony structures of the thorax are intact. Telemetry leads overlie the chest. IMPRESSION: 1. Low volume film with cardiomegaly and vascular congestion. 2. Similar appearance left base atelectasis. Electronically Signed   By: Misty Stanley M.D.   On: 01/13/2017 17:40   Dg Hand Complete Right  Result Date: 01/15/2017 CLINICAL DATA:  Fall several days ago. EXAM: RIGHT HAND - COMPLETE 3+ VIEW COMPARISON:  12/27/2016 FINDINGS: Deformity in the distal radius and ulna from old injury. Angulated  fracture through the right first metacarpal again noted with increasing angulation. No new fracture. Evidence of scapholunate dissociation with widening, stable. IMPRESSION: Increasing angulation at the distal right first metacarpal fracture. stable scapholunate dissociation. Electronically Signed   By: Rolm Baptise M.D.   On: 01/15/2017 13:21   Dg Hand Complete Right  Result Date: 12/27/2016 CLINICAL DATA:  Fall, wrist pain.  Thumb pain. EXAM: RIGHT HAND - COMPLETE 3+ VIEW COMPARISON:  11/16/2015 FINDINGS: There is a fracture through the right first distal metacarpal with mild angulation. Old healed distal radial fracture noted. There is widening of the scapholunate distance, increasing since prior study, now on 7 mm compared to 5 mm previously compatible with scapholunate dissociation. Old ulnar styloid fracture noted. IMPRESSION: Angulated distal right first metacarpal fracture. Scapholunate dissociation, worsening since prior study. Old distal radial and ulnar styloid fractures. Electronically Signed   By: Rolm Baptise M.D.   On: 12/27/2016 08:02      Discharge Exam: Vitals:   01/19/17 0945 01/19/17 0954  BP: 126/64   Pulse: (!) 59   Resp: 18   Temp: 98 F (36.7 C)   SpO2: 93% 93%   Vitals:   01/19/17 0046 01/19/17 0500 01/19/17 0945 01/19/17 0954  BP: 118/82 (!) 142/78 126/64   Pulse: 83 69 (!) 59   Resp: 16 18 18    Temp: 98.7 F (37.1 C) 98.5 F (36.9 C) 98 F (36.7 C)   TempSrc: Oral Oral Oral   SpO2: 92% 94% 93% 93%  Weight:      Height:        General: Pt is alert, awake, not in acute distress Cardiovascular: RRR, S1/S2 +, no rubs, no gallops Respiratory: CTA bilaterally, no wheezing, no rhonchi Abdominal: Soft, NT, ND, bowel sounds + Extremities: no edema, no cyanosis    The results of significant diagnostics from this hospitalization (including imaging, microbiology, ancillary and laboratory) are listed below for reference.     Microbiology: Recent Results  (from the past 240 hour(s))  Urine culture     Status: None   Collection Time: 01/13/17  6:05 PM  Result Value Ref Range Status   Specimen Description URINE, CATHETERIZED  Final   Special Requests NONE  Final   Culture NO GROWTH  Final   Report Status 01/14/2017 FINAL  Final  MRSA PCR Screening     Status: None   Collection Time: 01/14/17  6:34 AM  Result Value Ref Range Status   MRSA by PCR NEGATIVE NEGATIVE Final    Comment:        The GeneXpert MRSA Assay (FDA approved for NASAL specimens only), is one component of a comprehensive MRSA  colonization surveillance program. It is not intended to diagnose MRSA infection nor to guide or monitor treatment for MRSA infections.      Labs: BNP (last 3 results) No results for input(s): BNP in the last 8760 hours. Basic Metabolic Panel: Recent Labs  Lab 01/13/17 1800 01/14/17 0614 01/18/17 0253  NA 137 140 140  K 4.0 3.4* 3.8  CL 103 107 107  CO2 25 22 23   GLUCOSE 88 79 90  BUN 18 15 18   CREATININE 1.05 1.06 0.84  CALCIUM 8.7* 8.4* 8.7*   Liver Function Tests: Recent Labs  Lab 01/13/17 1800 01/14/17 0614 01/18/17 0253  AST 88* 82* 19  ALT 40 38 22  ALKPHOS 105 98 82  BILITOT 2.1* 1.4* 0.8  PROT 6.9 6.4* 6.4*  ALBUMIN 3.7 3.5 3.2*   No results for input(s): LIPASE, AMYLASE in the last 168 hours. Recent Labs  Lab 01/14/17 0614  AMMONIA 41*   CBC: Recent Labs  Lab 01/13/17 1800 01/14/17 0614  WBC 13.3* 9.4  NEUTROABS 10.2*  --   HGB 14.2 12.9*  HCT 44.4 40.6  MCV 81.5 81.7  PLT 251 253   Cardiac Enzymes: Recent Labs  Lab 01/13/17 1800 01/14/17 0614 01/14/17 1155 01/14/17 1809 01/15/17 0720  CKTOTAL 3,532* 2,833*  --   --  1,119*  CKMB  --  30.4*  --   --   --   TROPONINI  --  0.03* <0.03 <0.03  --    BNP: Invalid input(s): POCBNP CBG: Recent Labs  Lab 01/18/17 1104 01/18/17 1650 01/18/17 2155 01/19/17 0642 01/19/17 1140  GLUCAP 143* 90 108* 86 85   D-Dimer No results for input(s):  DDIMER in the last 72 hours. Hgb A1c No results for input(s): HGBA1C in the last 72 hours. Lipid Profile No results for input(s): CHOL, HDL, LDLCALC, TRIG, CHOLHDL, LDLDIRECT in the last 72 hours. Thyroid function studies No results for input(s): TSH, T4TOTAL, T3FREE, THYROIDAB in the last 72 hours.  Invalid input(s): FREET3 Anemia work up No results for input(s): VITAMINB12, FOLATE, FERRITIN, TIBC, IRON, RETICCTPCT in the last 72 hours. Urinalysis    Component Value Date/Time   COLORURINE YELLOW 01/13/2017 1758   APPEARANCEUR CLOUDY (A) 01/13/2017 1758   LABSPEC 1.020 01/13/2017 1758   PHURINE 6.0 01/13/2017 1758   GLUCOSEU NEGATIVE 01/13/2017 1758   HGBUR LARGE (A) 01/13/2017 1758   HGBUR negative 12/11/2009 1356   BILIRUBINUR NEGATIVE 01/13/2017 1758   BILIRUBINUR MODERATE 06/05/2015 1407   KETONESUR 20 (A) 01/13/2017 1758   PROTEINUR 100 (A) 01/13/2017 1758   UROBILINOGEN 2.0 06/05/2015 1407   UROBILINOGEN 1.0 12/30/2014 1830   NITRITE NEGATIVE 01/13/2017 1758   LEUKOCYTESUR NEGATIVE 01/13/2017 1758   Sepsis Labs Invalid input(s): PROCALCITONIN,  WBC,  LACTICIDVEN Microbiology Recent Results (from the past 240 hour(s))  Urine culture     Status: None   Collection Time: 01/13/17  6:05 PM  Result Value Ref Range Status   Specimen Description URINE, CATHETERIZED  Final   Special Requests NONE  Final   Culture NO GROWTH  Final   Report Status 01/14/2017 FINAL  Final  MRSA PCR Screening     Status: None   Collection Time: 01/14/17  6:34 AM  Result Value Ref Range Status   MRSA by PCR NEGATIVE NEGATIVE Final    Comment:        The GeneXpert MRSA Assay (FDA approved for NASAL specimens only), is one component of a comprehensive MRSA colonization surveillance program. It is not  intended to diagnose MRSA infection nor to guide or monitor treatment for MRSA infections.      Time coordinating discharge: Over 30 minutes  SIGNED:   Velvet Bathe, MD [facility] 01/19/2017, 1:57 PM Pager [phone]  If 7PM-7AM, please contact night-coverage [email or web address] Password [email or web address]

## 2017-01-18 NOTE — Progress Notes (Addendum)
PROGRESS NOTE    Patrick Holt  BMW:[id number] DOB: 03/18/1950 DOA: 01/13/2017 PCP: Medicine, [facility]   Brief Narrative: Patrick Holt is a 66 y.o. Male with a history of diabetes mellitus type 2, COPD, CAD, paroxysmal atrial fibrillation, CVA, seizure disorder, dermatomyositis, bipolar disorder.  He presented to the emergency department after being found down by EMS.   Assessment & Plan:  Principal Problem:   Altered mental status Active Problems:   Atrial fibrillation (HCC)   Seizures (HCC)   Essential hypertension, benign   Pulmonary artery hypertension ([place])   Found down Possibly secondary to seizure versus syncope.  Patient has not had a seizure medications for the past month per his report.  He states that these were stolen.  Evidence of rhabdomyolysis with elevated CK that is now trending down. Elevated troponin in setting of rhabdomyolysis and no chest pain. EKG similar to previous last month. Echo unremarkable except for severe PAH and grade 1 diastolic dysfunction.  Altered mental status Patient at baseline currently and possibly was altered secondary to postictal state. Resolved.  Seizure disorder Possibly contributing to this admission.  Patient has not had his outpatient medications. EEG unremarkable. Depakote level subtherapeutic. Keppra level therapeutic -Continue Keppra and Depakote  Rhabdomyolysis Dermatomyositis Patient with a history of dermatomyositis currently does not follow-up with rheumatology secondary to being fired from multiple practices secondary to frequent no-shows.  Patient is not on therapy currently for his dermatomyositis.  He presented with elevated CK which is now trending down but could be likely secondary to rhabdomyolysis since he was found down by EMS. CK trended down -Continue to hold Lipitor  Abnormal AST Expected in the setting of rhabdomyolysis  Paroxysmal atrial fibrillation -Continue  amiodarone  CAD History of CVA -Continue aspirin -Hold Lipitor in the setting of elevated CK and AST  Essential hypertension -Continue amlodipine  GERD -Continue Protonix  COPD -Continue Symbicort  Hypokalemia Supplementation as needed  Bipolar disorder Hypomanic per psychiatry evaluation -Continue Depakote -Re-consult psych for capacity as patient is unsafe to go home and is currently declining SNF discharge  Pulmonary artery hypertension Patient will need to follow-up with cardiology as an outpatient.  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 -thumb splint -ortho recs: outpatient follow-up; continue splint    DVT prophylaxis: Lovenox Code Status: Full code Family Communication: None at bedside Disposition Plan: Medically stable for discharge. Discharge to SNF when bed available. If discharge home, will need new prescriptions.   Consultants:   None  Procedures:   Echocardiogram (01/14/2017) Study Conclusions  - Left ventricle: The cavity size was normal. There was mild focal   basal hypertrophy of the septum. Systolic function was vigorous.   The estimated ejection fraction was in the range of 65% to 70%.   Wall motion was normal; there were no regional wall motion   abnormalities. Doppler parameters are consistent with abnormal   left ventricular relaxation (grade 1 diastolic dysfunction). - Aortic valve: Trileaflet; mildly thickened, mildly calcified   leaflets. There was moderate stenosis. Peak velocity (S): 308   cm/s. Mean gradient (S): 19 mm Hg. Valve area (VTI): 1.51 cm^2.   Valve area (Vmax): 1.48 cm^2. Valve area (Vmean): 1.47 cm^2. - Left atrium: The atrium was mildly dilated. - Right ventricle: The cavity size was mildly dilated. Wall   thickness was normal. - Pulmonary arteries: Systolic pressure was severely increased. PA   peak pressure: 75 mm Hg (S).  Antimicrobials:  None     Subjective: Eyes feel better today  Objective: Vitals:   01/18/17 0402 01/18/17 0645 01/18/17 0909 01/18/17 0915  BP:  114/71 (!) 142/74   Pulse:  66 69   Resp:  18 20   Temp:  98.6 F (37 C) 98 F (36.7 C)   TempSrc:  Oral Oral   SpO2: 94% 95% 95% 96%  Weight:      Height:        Intake/Output Summary (Last 24 hours) at 01/18/2017 1233 Last data filed at 01/18/2017 0416 Gross per 24 hour  Intake 100 ml  Output 750 ml  Net -650 ml   Filed Weights   01/13/17 1631 01/14/17 0100 01/14/17 0434  Weight: 102.1 kg (225 lb) 110.4 kg (243 lb 6.2 oz) 111 kg (244 lb 11.4 oz)    Examination:  General exam: Appears calm and comfortable Respiratory system: Clear to auscultation. Respiratory effort normal. Cardiovascular system: S1 & S2 heard, RRR. No murmurs, rubs, gallops or clicks. Gastrointestinal system: Abdomen is nondistended, soft and nontender. No organomegaly or masses felt. Normal bowel sounds heard. Central nervous system: Alert and oriented. No focal neurological deficits. Extremities: No edema. No calf tenderness Skin: No cyanosis. No rashes Psychiatry: Judgement and insight appear impaired. Anxious.     Data Reviewed: I have personally reviewed following labs and imaging studies  CBC: Recent Labs  Lab 01/13/17 1800 01/14/17 0614  WBC 13.3* 9.4  NEUTROABS 10.2*  --   HGB 14.2 12.9*  HCT 44.4 40.6  MCV 81.5 81.7  PLT 251 809   Basic Metabolic Panel: Recent Labs  Lab 01/13/17 1800 01/14/17 0614 01/18/17 0253  NA 137 140 140  K 4.0 3.4* 3.8  CL 103 107 107  CO2 25 22 23   GLUCOSE 88 79 90  BUN 18 15 18   CREATININE 1.05 1.06 0.84  CALCIUM 8.7* 8.4* 8.7*   GFR: Estimated Creatinine Clearance: 101.5 mL/min (by C-G formula based on SCr of 0.84 mg/dL). Liver Function Tests: Recent Labs  Lab 01/13/17 1800 01/14/17 0614 01/18/17 0253  AST 88* 82* 19  ALT 40 38 22  ALKPHOS 105 98 82  BILITOT 2.1* 1.4* 0.8  PROT 6.9 6.4* 6.4*  ALBUMIN 3.7 3.5  3.2*   No results for input(s): LIPASE, AMYLASE in the last 168 hours. Recent Labs  Lab 01/14/17 0614  AMMONIA 41*   Coagulation Profile: Recent Labs  Lab 01/13/17 1800  INR 1.09   Cardiac Enzymes: Recent Labs  Lab 01/13/17 1800 01/14/17 0614 01/14/17 1155 01/14/17 1809 01/15/17 0720  CKTOTAL 3,532* 2,833*  --   --  1,119*  CKMB  --  30.4*  --   --   --   TROPONINI  --  0.03* <0.03 <0.03  --    BNP (last 3 results) No results for input(s): PROBNP in the last 8760 hours. HbA1C: No results for input(s): HGBA1C in the last 72 hours. CBG: Recent Labs  Lab 01/17/17 1126 01/17/17 1616 01/17/17 2215 01/18/17 0615 01/18/17 1104  GLUCAP 106* 78 118* 105* 143*   Lipid Profile: No results for input(s): CHOL, HDL, LDLCALC, TRIG, CHOLHDL, LDLDIRECT in the last 72 hours. Thyroid Function Tests: No results for input(s): TSH, T4TOTAL, FREET4, T3FREE, THYROIDAB in the last 72 hours. Anemia Panel: No results for input(s): VITAMINB12, FOLATE, FERRITIN, TIBC, IRON, RETICCTPCT in the last 72 hours. Sepsis Labs: Recent Labs  Lab 01/13/17 1821  LATICACIDVEN 1.65    Recent Results (from the past 240 hour(s))  Urine  culture     Status: None   Collection Time: 01/13/17  6:05 PM  Result Value Ref Range Status   Specimen Description URINE, CATHETERIZED  Final   Special Requests NONE  Final   Culture NO GROWTH  Final   Report Status 01/14/2017 FINAL  Final  MRSA PCR Screening     Status: None   Collection Time: 01/14/17  6:34 AM  Result Value Ref Range Status   MRSA by PCR NEGATIVE NEGATIVE Final    Comment:        The GeneXpert MRSA Assay (FDA approved for NASAL specimens only), is one component of a comprehensive MRSA colonization surveillance program. It is not intended to diagnose MRSA infection nor to guide or monitor treatment for MRSA infections.          Radiology Studies: No results found.      Scheduled Meds: . amiodarone  100 mg Oral Daily  .  amLODipine  5 mg Oral Daily  . aspirin EC  81 mg Oral Daily  . divalproex  750 mg Oral Daily  . enoxaparin (LOVENOX) injection  40 mg Subcutaneous Q24H  . insulin aspart  0-5 Units Subcutaneous QHS  . insulin aspart  0-9 Units Subcutaneous TID WC  . levETIRAcetam  750 mg Oral BID  . mometasone-formoterol  2 puff Inhalation BID  . multivitamin with minerals  1 tablet Oral Daily  . oxybutynin  5 mg Oral Daily  . pantoprazole  40 mg Oral Daily  . potassium chloride SA  20 mEq Oral QODAY   Continuous Infusions:    LOS: 0 days     Cordelia Poche, MD [facility] 01/18/2017, 12:33 PM Pager: [phone]  If 7PM-7AM, please contact night-coverage [email or web address] Password [facility] 01/18/2017, 12:33 PM

## 2017-01-19 LAB — GLUCOSE, CAPILLARY
GLUCOSE-CAPILLARY: 85 mg/dL (ref 65–99)
GLUCOSE-CAPILLARY: 109 mg/dL — AB (ref 65–99)
Glucose-Capillary: 86 mg/dL (ref 65–99)

## 2017-01-19 MED ORDER — AMIODARONE HCL 200 MG PO TABS: 100.0000 mg | ORAL_TABLET | Freq: Every day | ORAL | 0 refills | Status: DC

## 2017-01-19 MED ORDER — DIVALPROEX SODIUM ER 250 MG PO TB24: 750.0000 mg | ORAL_TABLET | Freq: Every day | ORAL | 0 refills | Status: DC

## 2017-01-19 MED ORDER — LEVETIRACETAM 750 MG PO TABS: 750.0000 mg | ORAL_TABLET | Freq: Two times a day (BID) | ORAL | 0 refills | Status: DC

## 2017-01-19 MED ORDER — AMLODIPINE BESYLATE 5 MG PO TABS: 5.0000 mg | ORAL_TABLET | Freq: Every day | ORAL | 0 refills | Status: DC

## 2017-01-19 MED ORDER — BUDESONIDE-FORMOTEROL FUMARATE 160-4.5 MCG/ACT IN AERO: 2.0000 | INHALATION_SPRAY | Freq: Two times a day (BID) | RESPIRATORY_TRACT | 0 refills | Status: DC

## 2017-01-19 MED ORDER — ATORVASTATIN CALCIUM 40 MG PO TABS: 40.0000 mg | ORAL_TABLET | Freq: Every day | ORAL | 0 refills | Status: DC

## 2017-01-19 NOTE — Care Management Note (Signed)
Case Management Note  Patient Details  Name: Patrick Holt MRN: [id number] Date of Birth: 01/05/1951  Subjective/Objective:                    Action/Plan: Pt discharging home with [facility] services. CM provided the patient choice of [place] agencies and he selected [facility]. They currently do not have to staff to see him. Pt then selected [facility]. Dan with [facility] notified and accepted the referral.  Pt states he has a motorized scooter at home to assist with mobility. CM inquired about him filling his prescriptions. He states he will have a friend take him to get them filled.  Pt to transport home via [facility]. [facility] called and arranged for after 3 pm. Bedside RN updated and transport form on the front of the chart.   Expected Discharge Date:  01/19/17               Expected Discharge Plan:  [facility]  In-House Referral:     Discharge planning Services  CM Consult  Post Acute Care Choice:  Home Health Choice offered to:  Patient  DME Arranged:    DME Agency:     HH Arranged:  RN, PT, OT, Nurse's Aide, Speech Therapy, Social Work [employer] Agency:  [facility]  Status of Service:  Completed, signed off  If discussed at [employer], dates discussed:    Additional Comments:  Pollie Friar, RN 01/19/2017, 2:11 PM

## 2017-01-19 NOTE — Progress Notes (Signed)
CSW noting from psychiatry note yesterday afternoon that patient has capacity to refuse SNF placement at this time. CSW alerted by [facility] that patient wants to go home with home health instead of SNF.  CSW signing off.  Laveda Abbe, [place] Clinical Social Worker [phone]

## 2017-01-19 NOTE — Progress Notes (Signed)
Occupational Therapy Treatment Patient Details Name: Patrick Holt MRN: [id number] DOB: [birth year] Today's Date: 01/19/2017    History of present illness 66 y.o. male presents to hospital after being found down by EMS. Pt with AMS upon arrival to hospital. PMH significant of DM, COPD, CAD, PAF, CVA, seizure disorder, dermatomyositis, and bipolar disorder.    OT comments  Pt internally distracted throughout session; perseverating on "being robbed" and going home. Upon arrival, pt attempting to get dressed in bed; wrapped up in phone cord with shirt donned incorrectly. Pt required min assist for proper donning of shirt and max assist for LB dressing. Reapplied RUE splint properly, as it was donned incorrectly as well. Pt with poor safety awareness, decreased awareness of deficits, and poor sitting and standing balance--he is a high safety and fall risk at this time. Pt with plans to d/c home today; continue to feel SNF placement for continued rehab is appropriate. Will continue to follow acutely.   Follow Up Recommendations  SNF;Supervision/Assistance - 24 hour    Equipment Recommendations  Other (comment)(defer to next venue)    Recommendations for Other Services      Precautions / Restrictions Precautions Precautions: Fall Restrictions Weight Bearing Restrictions: No       Mobility Bed Mobility Overal bed mobility: Needs Assistance Bed Mobility: Supine to Sit;Sit to Supine     Supine to sit: Min assist;HOB elevated Sit to supine: Min guard;HOB elevated   General bed mobility comments: Min assist for trunk elevation to sitting. Cues for sequencing and safety. Poor balance at EOB with falling posteriorly x3. Min guard for return to bed  Transfers Overall transfer level: Needs assistance Equipment used: Rolling walker (2 wheeled) Transfers: Sit to/from Stand Sit to Stand: Min assist         General transfer comment: to boost up and for standing balance. Cues for hand  placement and proper use of RW    Balance Overall balance assessment: Needs assistance Sitting-balance support: Feet supported;No upper extremity supported Sitting balance-Leahy Scale: Fair     Standing balance support: Single extremity supported Standing balance-Leahy Scale: Poor Standing balance comment: Min assist while pulling up pants with single extremity support in standing                           ADL either performed or assessed with clinical judgement   ADL Overall ADL's : Needs assistance/impaired                 Upper Body Dressing : Minimal assistance;Sitting Upper Body Dressing Details (indicate cue type and reason): to don shirt. cues for sequencing Lower Body Dressing: Maximal assistance;Sit to/from stand Lower Body Dressing Details (indicate cue type and reason): Assist to start feet into pants and pull up in standing. Cues for sequencing               General ADL Comments: Min assist for sit to stand at EOB with cues for hand placement and technique with RW. Re-donned wrist brace as it was incorrectly applied upon arrival and pt c/o discomfort     Vision       Perception     Praxis      Cognition Arousal/Alertness: Awake/alert Behavior During Therapy: Anxious Overall Cognitive Status: No family/caregiver present to determine baseline cognitive functioning Area of Impairment: Safety/judgement;Memory;Attention;Problem solving;Awareness                   Current Attention Level:  Sustained Memory: Decreased short-term memory Following Commands: Follows one step commands consistently Safety/Judgement: Decreased awareness of safety;Decreased awareness of deficits Awareness: Intellectual Problem Solving: Slow processing;Difficulty sequencing General Comments: Pt perseverating on "being robbed" and needing to get home        Exercises     Shoulder Instructions       General Comments      Pertinent Vitals/ Pain        Pain Assessment: No/denies pain  Home Living                                          Prior Functioning/Environment              Frequency  Min 2X/week        Progress Toward Goals  OT Goals(current goals can now be found in the care plan section)  Progress towards OT goals: Progressing toward goals  Acute Rehab OT Goals Patient Stated Goal: go home today OT Goal Formulation: With patient  Plan Discharge plan remains appropriate    Co-evaluation                 AM-PAC PT "6 Clicks" Daily Activity     Outcome Measure   Help from another person eating meals?: A Little Help from another person taking care of personal grooming?: A Little Help from another person toileting, which includes using toliet, bedpan, or urinal?: A Lot Help from another person bathing (including washing, rinsing, drying)?: A Lot Help from another person to put on and taking off regular upper body clothing?: A Little Help from another person to put on and taking off regular lower body clothing?: A Lot 6 Click Score: 15    End of Session Equipment Utilized During Treatment: Rolling walker  OT Visit Diagnosis: Other symptoms and signs involving cognitive function   Activity Tolerance Patient tolerated treatment well   Patient Left in bed;with call bell/phone within reach;with bed alarm set;with nursing/sitter in room   Nurse Communication          Time: 4680-3212 OT Time Calculation (min): 14 min  Charges: OT General Charges $OT Visit: 1 Visit OT Treatments $Self Care/Home Management : 8-22 mins  Christiann Hagerty A. Ulice Brilliant, M.S., OTR/L Pager: [phone] 01/19/2017, 4:21 PM

## 2017-01-19 NOTE — Progress Notes (Signed)
Discharge instructions, RX's and follow up appts explained and provided to patient verbalized understanding. Patient transported home by [facility] services no c/o pain at d/c.  Dillie Burandt, Tivis Ringer, RN

## 2017-01-20 ENCOUNTER — Telehealth: Payer: Self-pay | Admitting: *Deleted

## 2017-01-20 IMAGING — MR MR LUMBAR SPINE W/O CM
5 of 13 series · 19 of 48 positions shown · non-contrast
Comparison: 02/18/2011 MRI.  10/09/2013 CT.

CLINICAL DATA: Loss of bowel and bladder control.

EXAM:
MRI THORACIC AND LUMBAR SPINE WITHOUT CONTRAST
TECHNIQUE: Multiplanar and multiecho pulse sequences of the thoracic and lumbar
spine were obtained without intravenous contrast.

[Series 3: T2 · sagittal · 3.0mm · 0.70mm/px · 3 of 15 slices shown (1 of 5)]
[im 1/15]
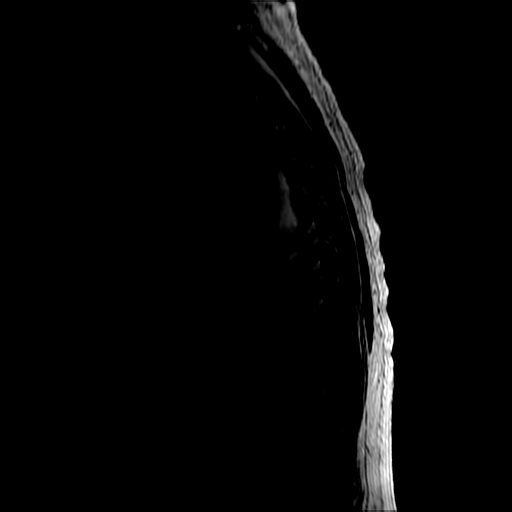
[im 8/15]
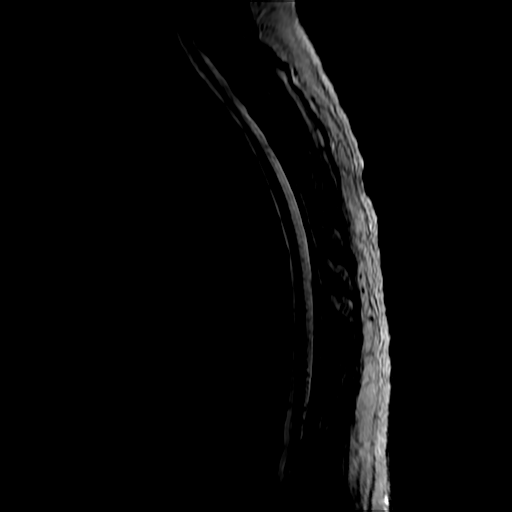
[im 15/15]
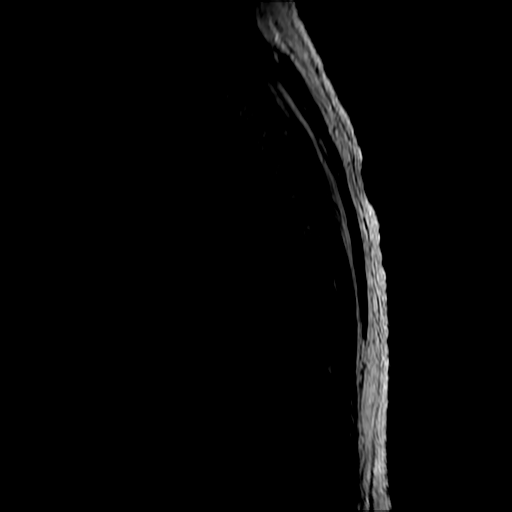

[Series 6: T2 · axial · 4.0mm · 0.39mm/px · z∈[-11,+121]mm · 5 of 28 slices shown (2 of 5)]
[im 1/28]
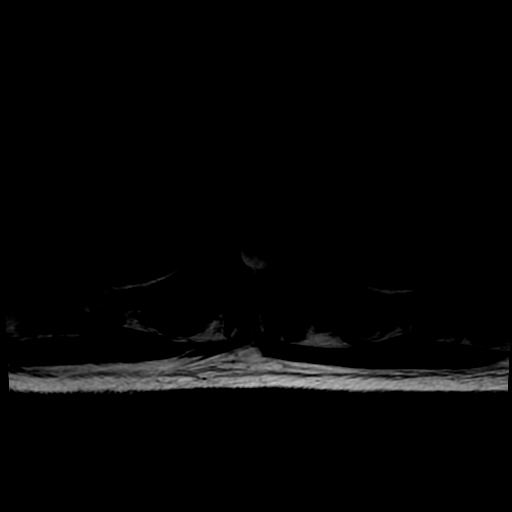
[im 7/28]
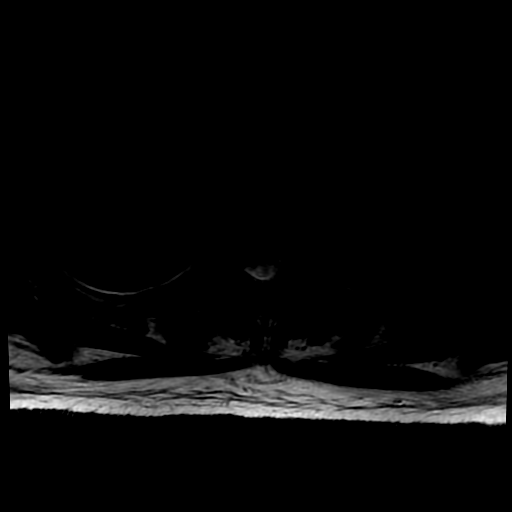
[im 14/28]
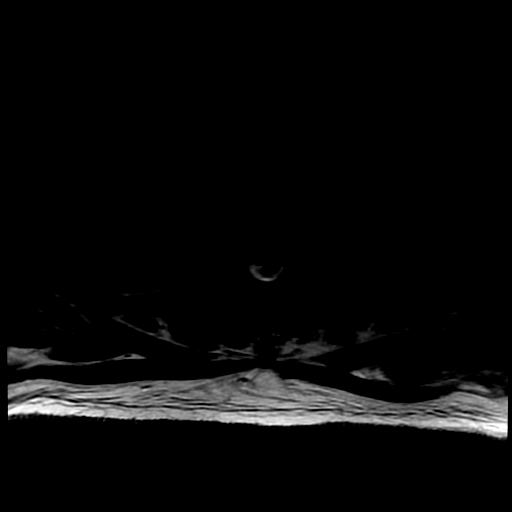
[im 21/28]
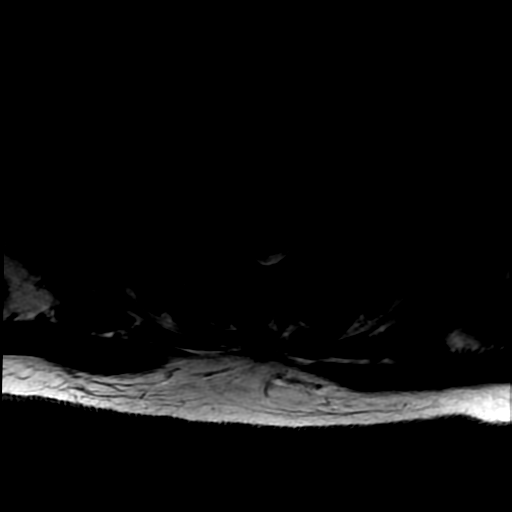
[im 28/28]
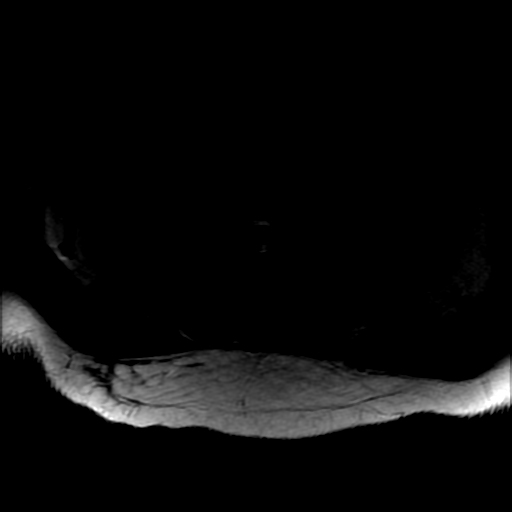

[Series 7: T2 · axial · 4.0mm · 0.39mm/px · z∈[-141,+13]mm · 5 of 29 slices shown (3 of 5)]
[im 1/29]
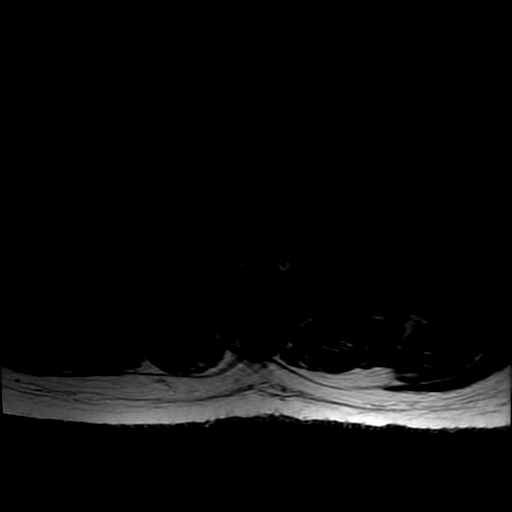
[im 8/29]
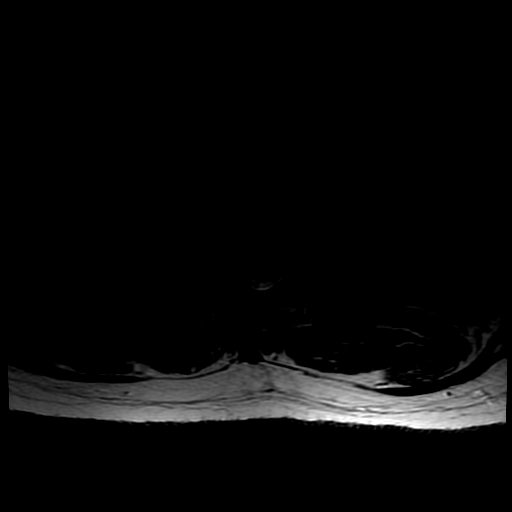
[im 15/29]
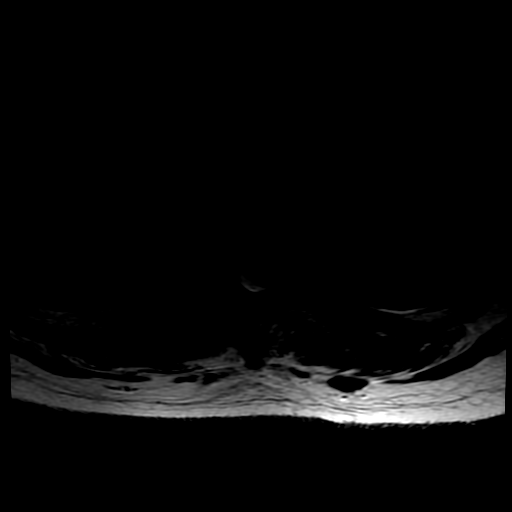
[im 22/29]
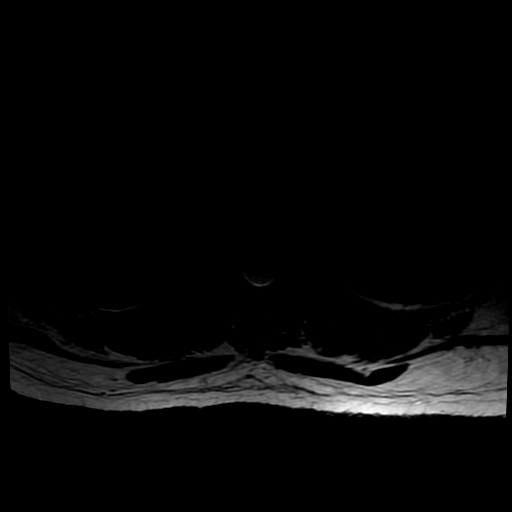
[im 29/29]
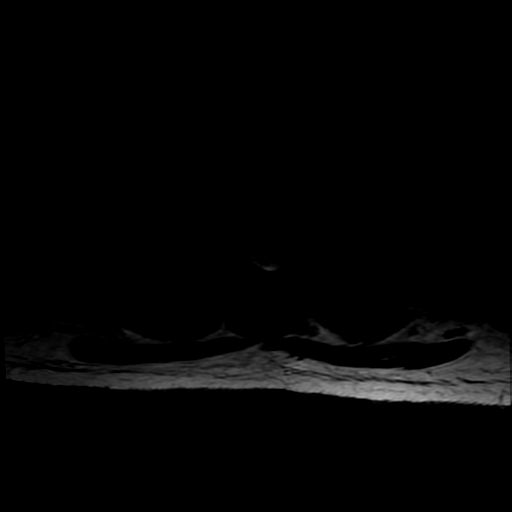

[Series 11: T2 · sagittal · 4.0mm · 0.59mm/px · 2 of 15 slices shown (4 of 5)]
[im 1/15]
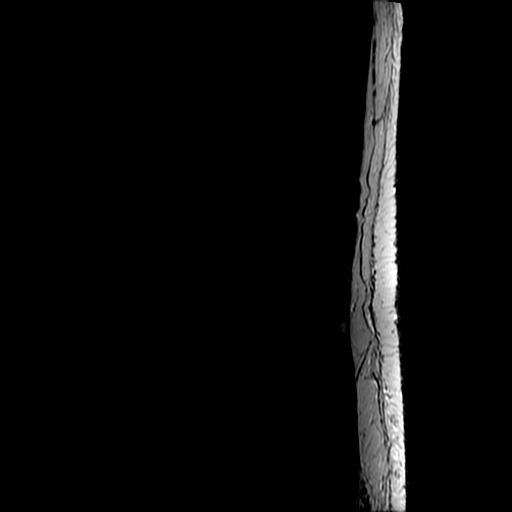
[im 15/15]
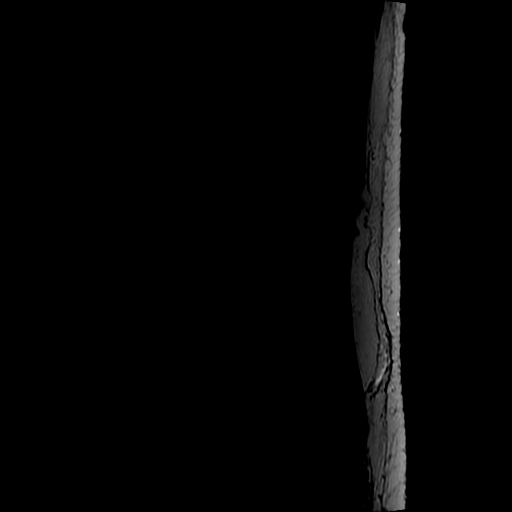

[Series 14: T2 · axial · 4.0mm · 0.39mm/px · z∈[-313,-183]mm · 4 of 38 slices shown (5 of 5)]
[im 1/38]
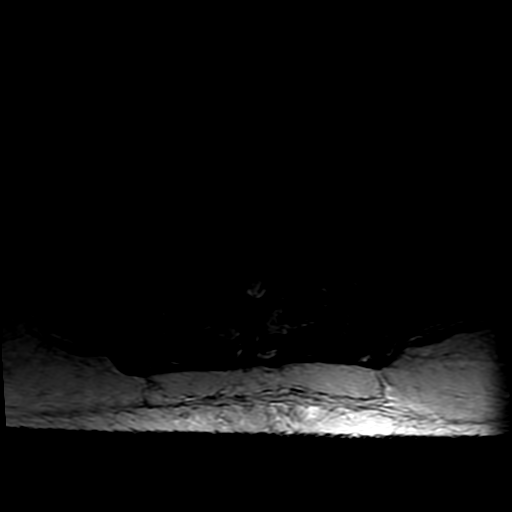
[im 8/38]
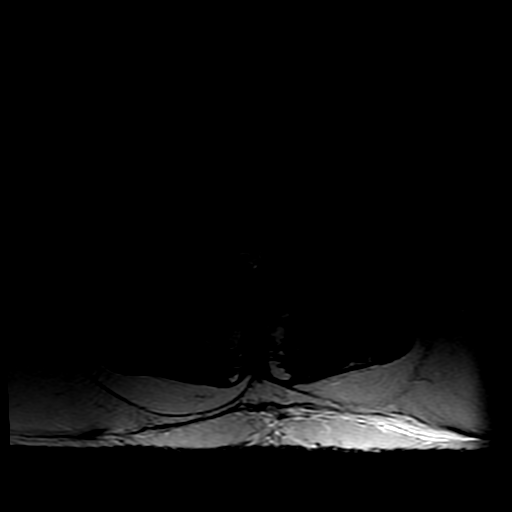
[im 15/38]
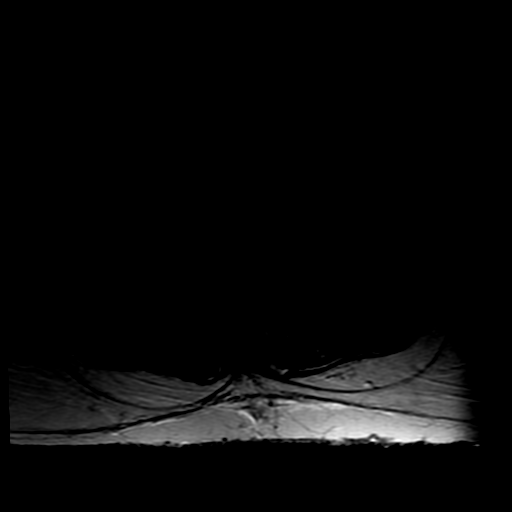
[im 23/38]
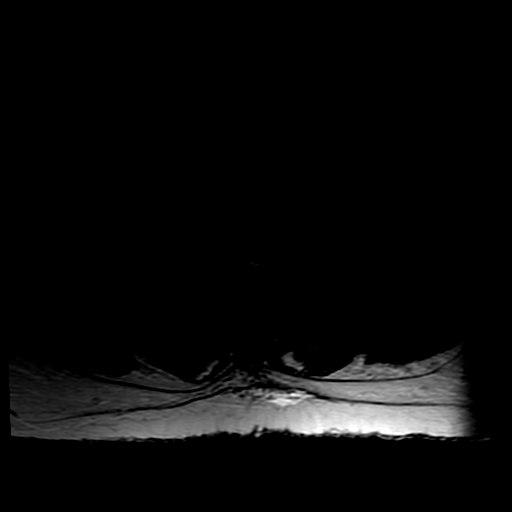

[19 of 48 positions shown; findings below may reference images not displayed]

FINDINGS: MR THORACIC SPINE FINDINGS

In the thoracic region, there is no abnormality other than minimal
desiccation of the discs. There is no disc herniation. There is no
spinal stenosis. There is no primary cord lesion. Paravertebral soft
tissues appear unremarkable. No focal osseous lesion.

MR LUMBAR SPINE FINDINGS

There is mild curvature convex to the left with the apex at L2. The
T12-L1 and L1-2 discs bulge minimally. The distal cord and conus are
normal.

L2-3: Chronic disc degeneration with bulging of the disc. Mild facet
and ligamentous hypertrophy. Mild spinal stenosis but no definite
neural compression. Discogenic edematous type changes of the
endplates, worse on the left.

L3-4: Mild bulging of the disc. Mild facet hypertrophy. Mild
stenosis without apparent neural compression.

L4-5: Mild bulging of the disc. Mild facet degeneration. No
compressive stenosis.

L5-S1: Chronic bilateral pars defects with anterolisthesis of 6 mm.
Bulging of the disc. No central canal stenosis. Mild chronic
foraminal narrowing, unchanged since the previous studies.
IMPRESSION: MR THORACIC SPINE IMPRESSION

No abnormal finding in the thoracic region.

MR LUMBAR SPINE IMPRESSION

No acute finding in the lumbar region. No explanation for loss of
bowel or bladder control. Chronic degenerative changes as outlined
above. Mild multifactorial stenosis at L3-4. Discogenic marrow
changes that could be associated with back pain.

Chronic bilateral pars defects at L5 with 6 mm of anterolisthesis in
chronic foraminal narrowing.

## 2017-01-20 NOTE — Telephone Encounter (Signed)
-----   Message from Larina Bras, [place] sent at 01/14/2017  4:42 PM EST ----- Regarding: FW: [facility] ECL   ----- Message ----- From: Jerene Bears, MD Sent: 01/14/2017   9:27 AM To: Larina Bras, CMA Subject: [facility] ECL                                         DNS, This guy was to come for EGD/colon today, but wound up being admitted to [facility] Had been seen in the office and these procedures were arranged for dysphagia and history of colon polyps. He will need to be seen again in the office by me or the last APP he saw before rescheduling these procedures. Thanks [employer]

## 2017-01-20 NOTE — Telephone Encounter (Signed)
I have left message for patient to call back. 

## 2017-01-25 IMAGING — CR DG CHEST 2V
2 series · 2 of 2 positions shown · non-contrast
Comparison: Chest radiograph from 07/16/2014, and CTA of the chest
performed 07/06/2014

CLINICAL DATA: Chronic central chest pain and shortness of breath,
worsening today. Initial encounter.

EXAM:
CHEST  2 VIEW

[x chest ap]
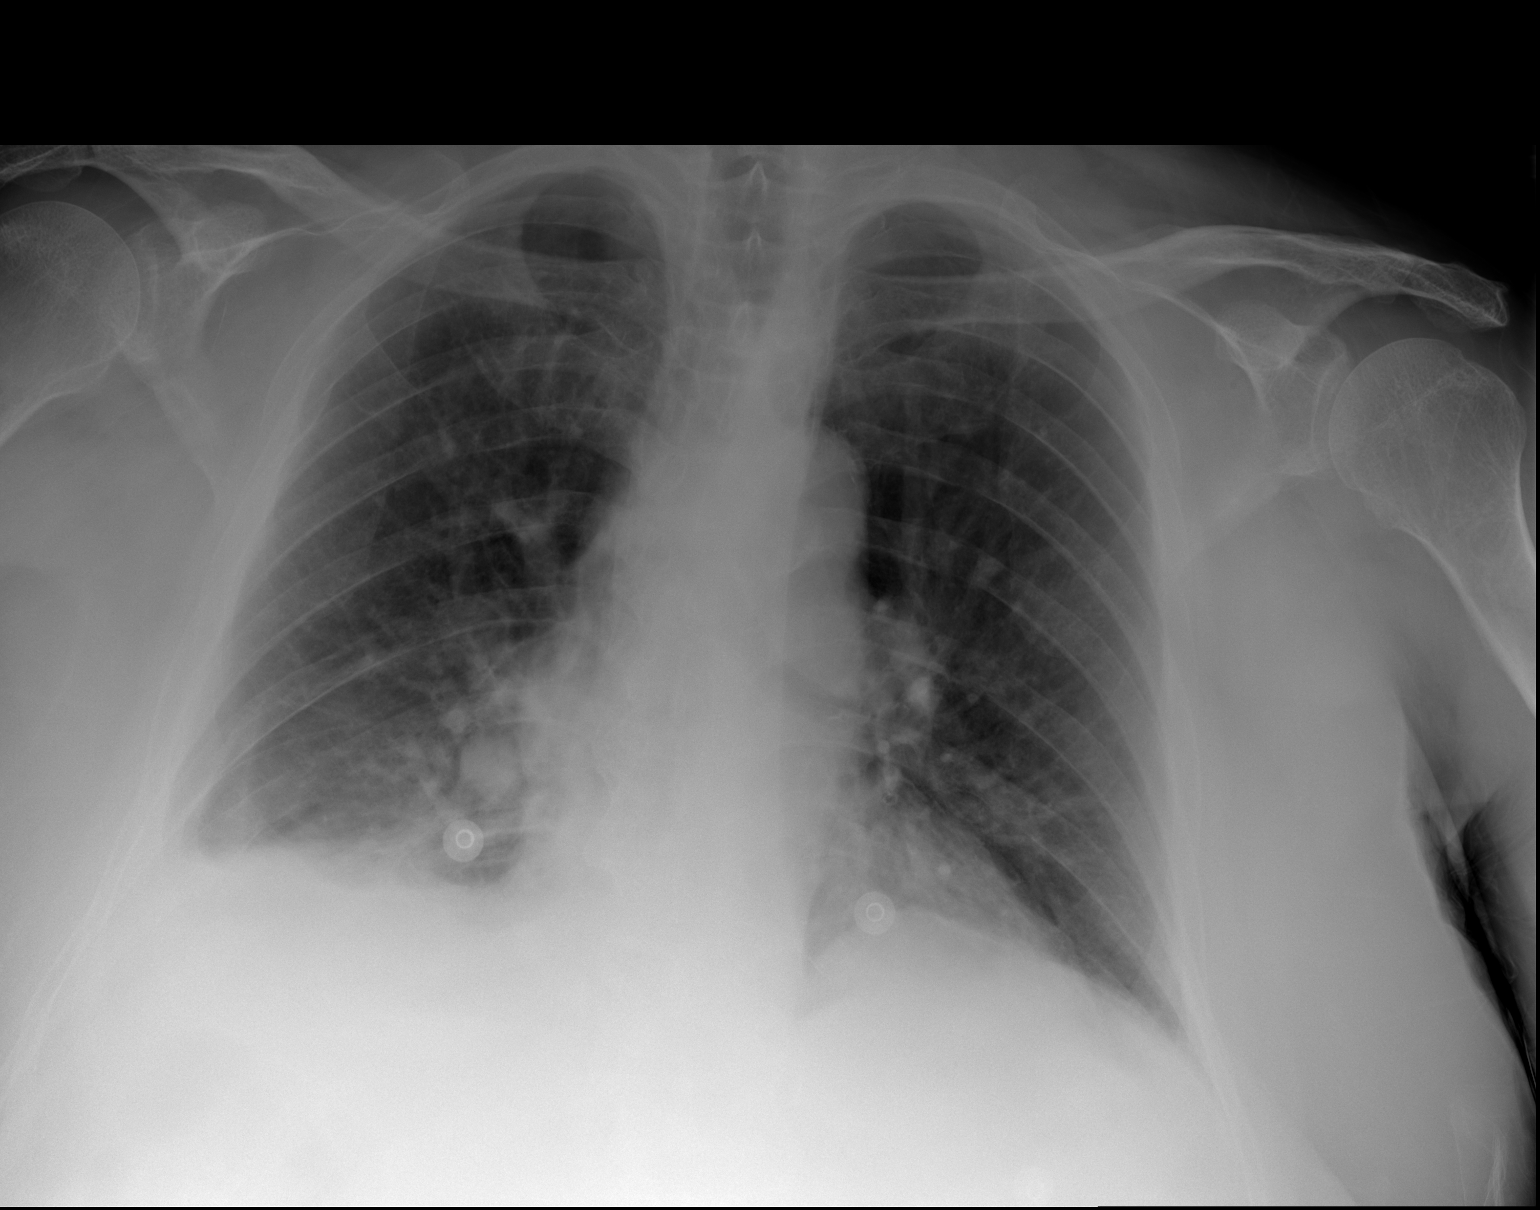

[w chest lat]
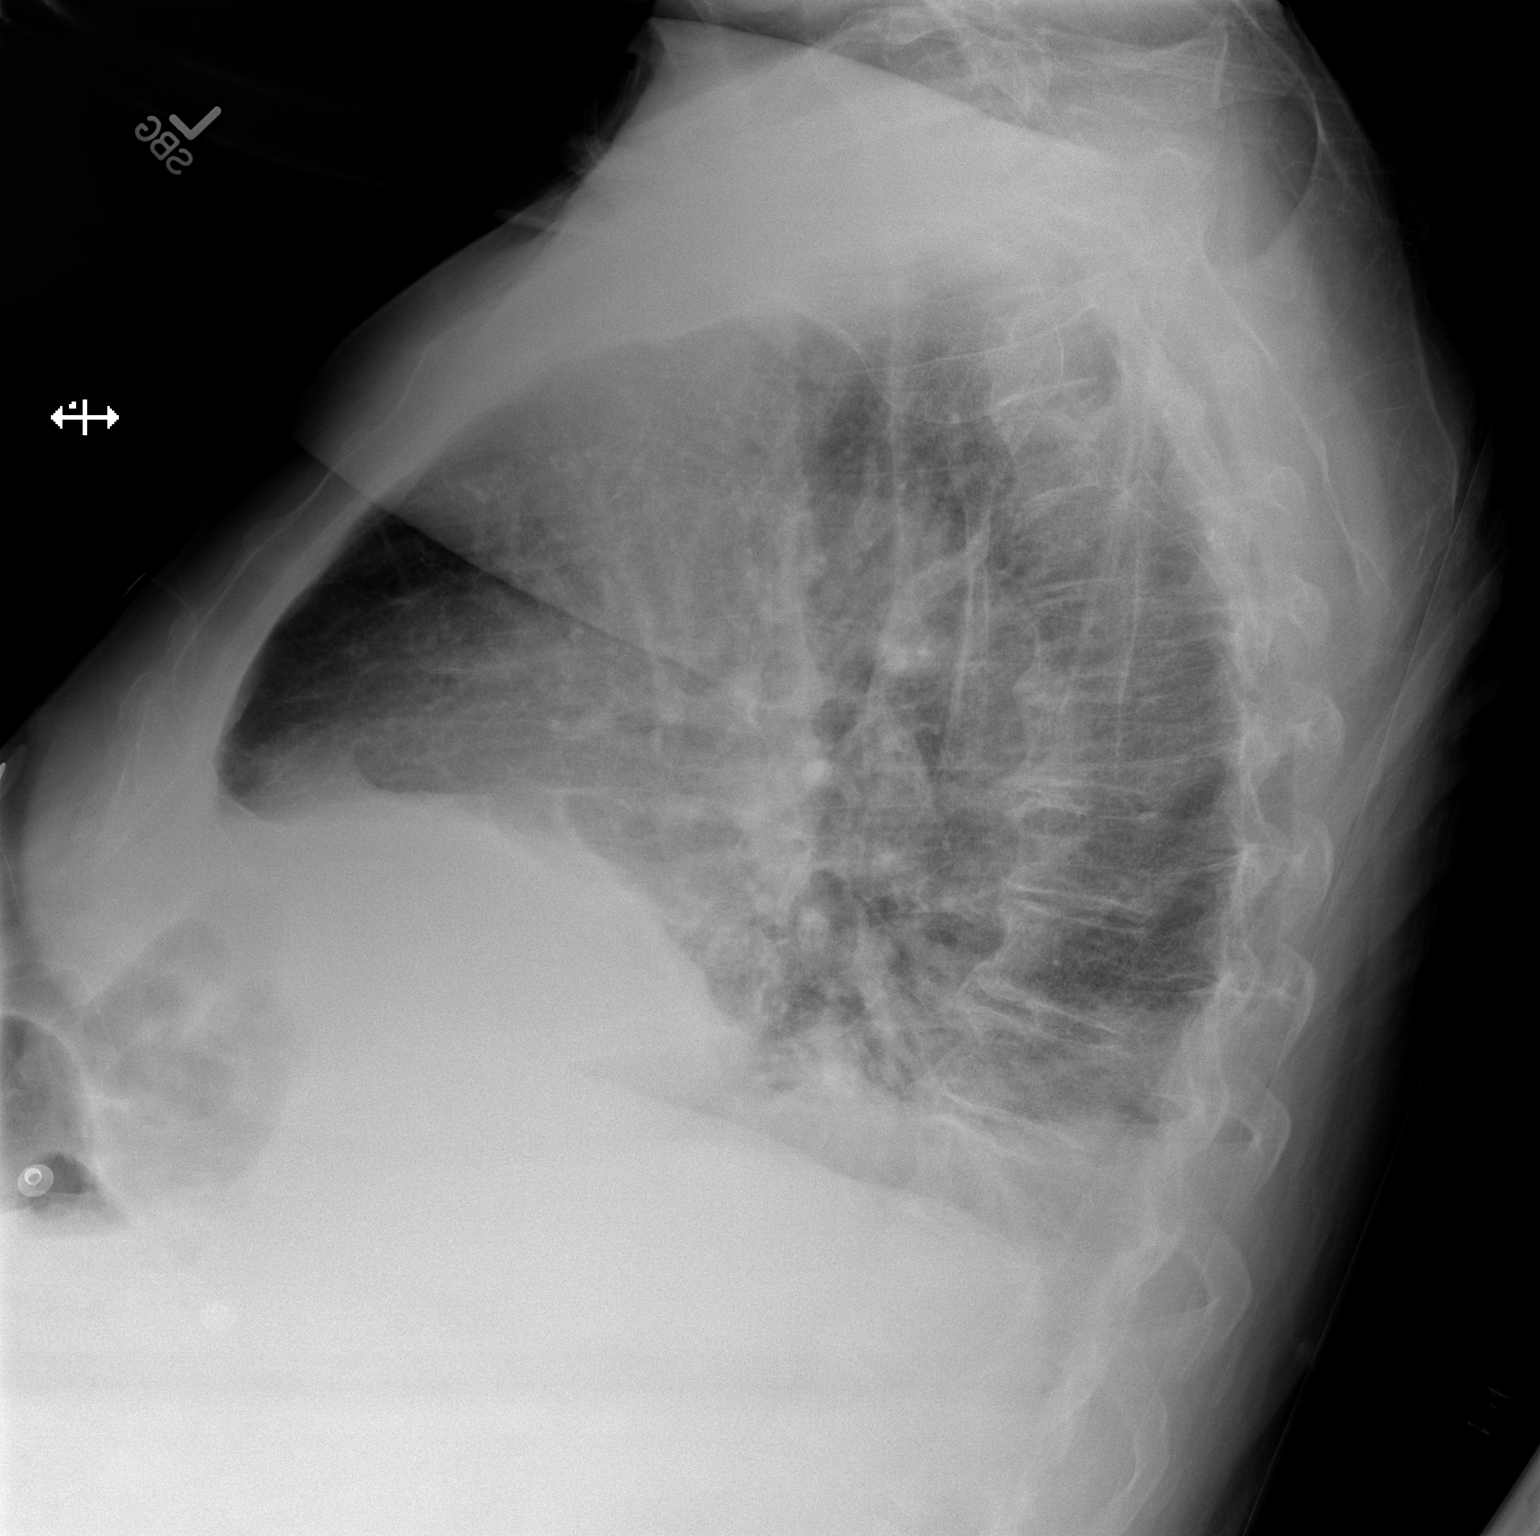

[2 of 2 positions shown; findings below may reference images not displayed]

FINDINGS: The lungs are well-aerated. A small right pleural effusion is noted.
Underlying vascular congestion is seen, with increased interstitial
markings, concerning for mild interstitial edema. There is no
evidence of pneumothorax.

The heart is normal in size; the mediastinal contour is within
normal limits. No acute osseous abnormalities are seen.
IMPRESSION: Small right pleural effusion noted. Underlying vascular congestion,
with increased interstitial markings, concerning for mild
interstitial edema.

## 2017-01-25 IMAGING — CT CT ANGIO CHEST
2 of 9 series · 18 of 46 positions shown · IV contrast (OMNI)
Comparison: 07/06/2014

CLINICAL DATA: Cough and shortness of Breath since [REDACTED].
Hemoptysis.

EXAM:
CT ANGIOGRAPHY CHEST WITH CONTRAST
TECHNIQUE: Multidetector CT imaging of the chest was performed using the
standard protocol during bolus administration of intravenous
contrast. Multiplanar CT image reconstructions and MIPs were
obtained to evaluate the vascular anatomy.
CONTRAST:  100mL OMNIPAQUE IOHEXOL 350 MG/ML SOLN

[Series 5: thins · axial · 0.69mm/px · z∈[+1094,+1348]mm · 15 of 286 slices shown]
[im 16/286  lung]
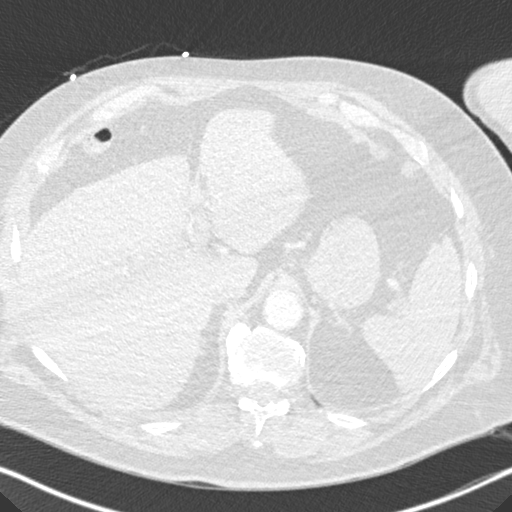
[im 32/286  soft-tissue]
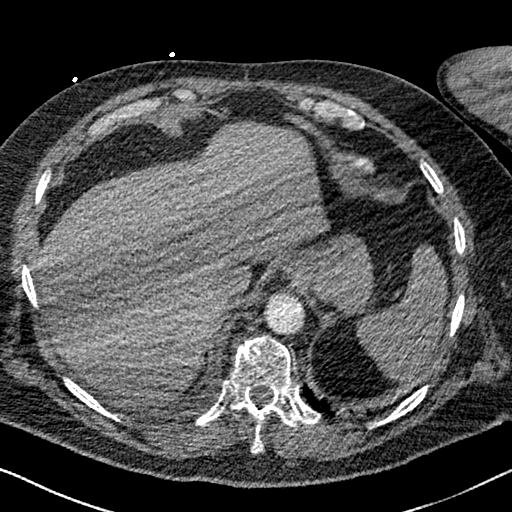
[im 48/286  lung]
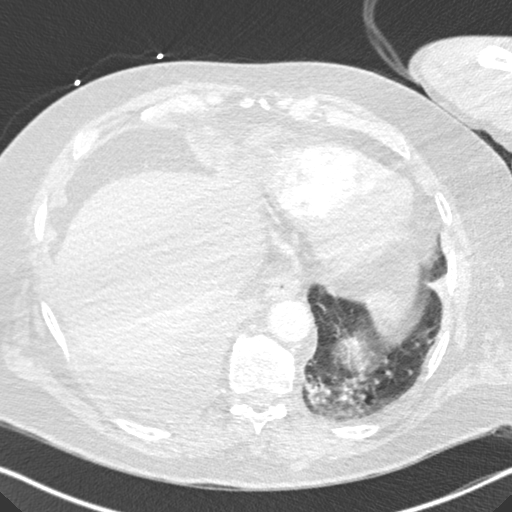
[im 64/286  soft-tissue]
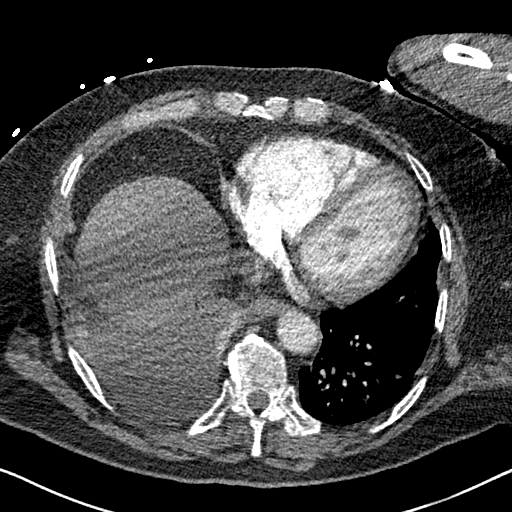
[im 96/286  lung]
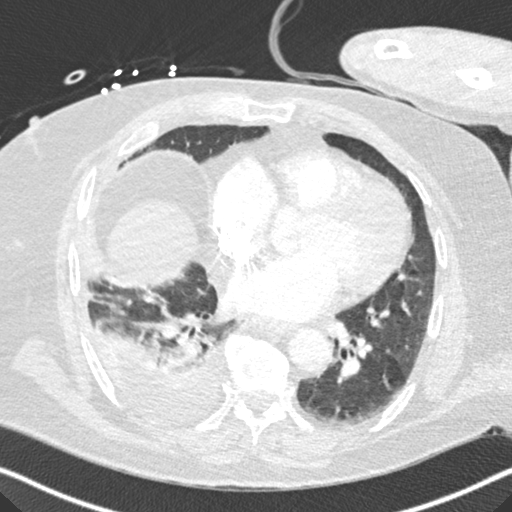
[im 111/286  soft-tissue]
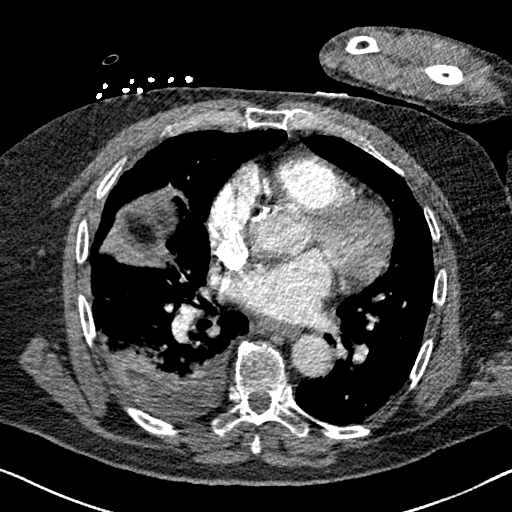
[im 127/286  lung]
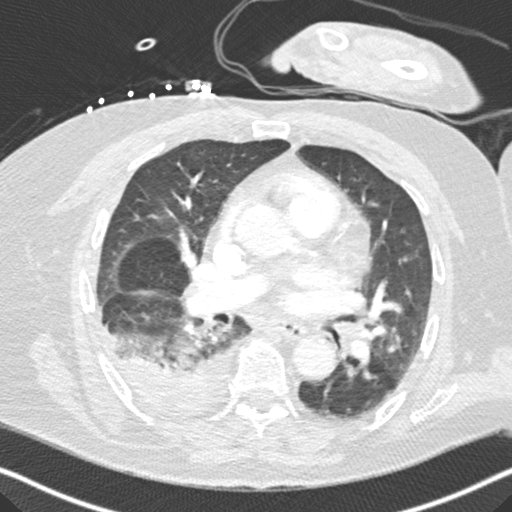
[im 143/286  soft-tissue]
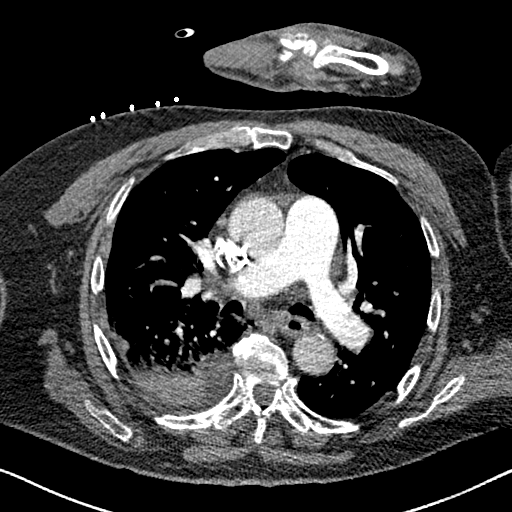
[im 159/286  lung]
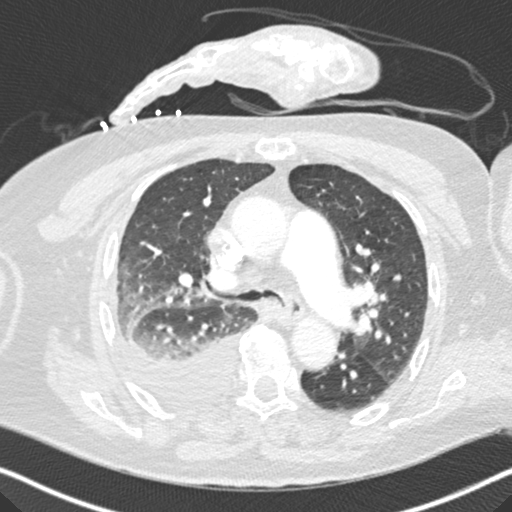
[im 175/286  soft-tissue]
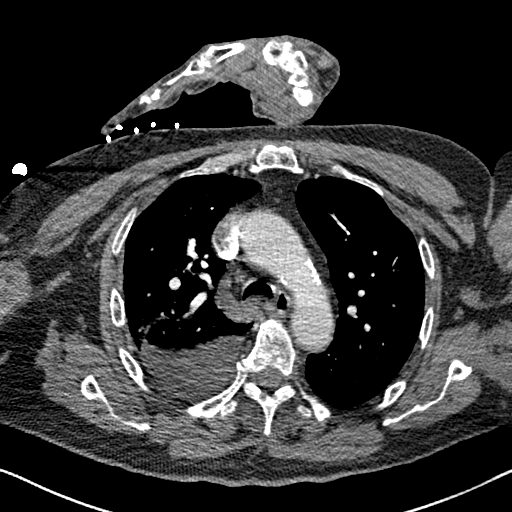
[im 191/286  lung]
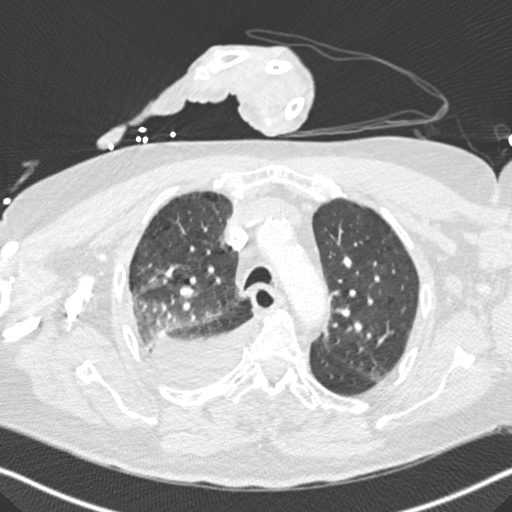
[im 222/286  soft-tissue]
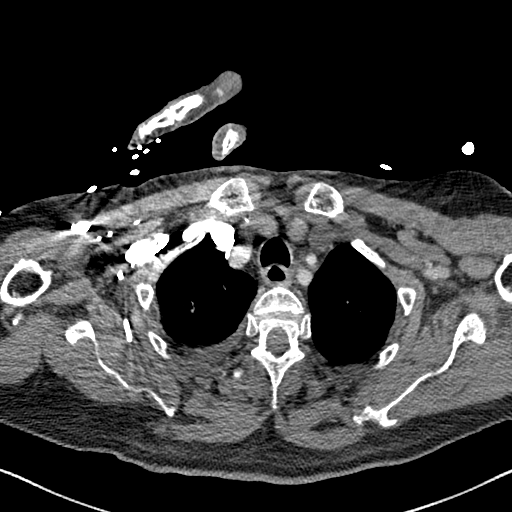
[im 238/286  lung]
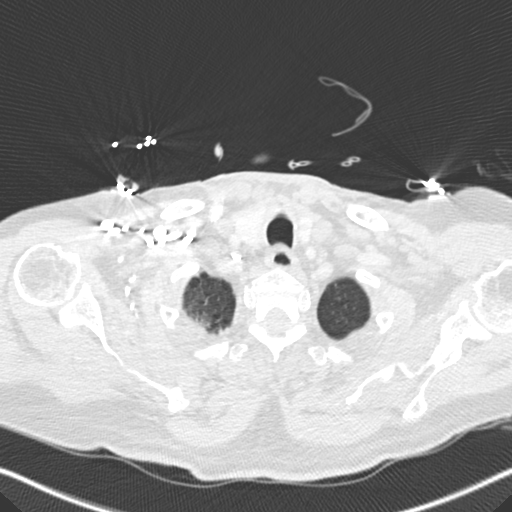
[im 254/286  soft-tissue]
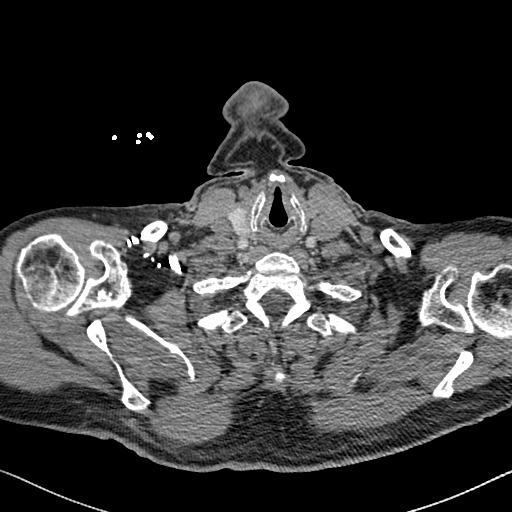
[im 270/286  lung]
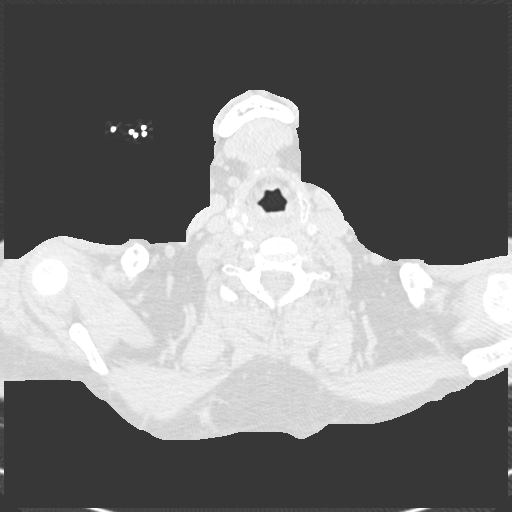

[Series 7: coronal mpr · coronal · 0.59mm/px · 3 of 133 slices shown]
[im 34/133  soft-tissue]
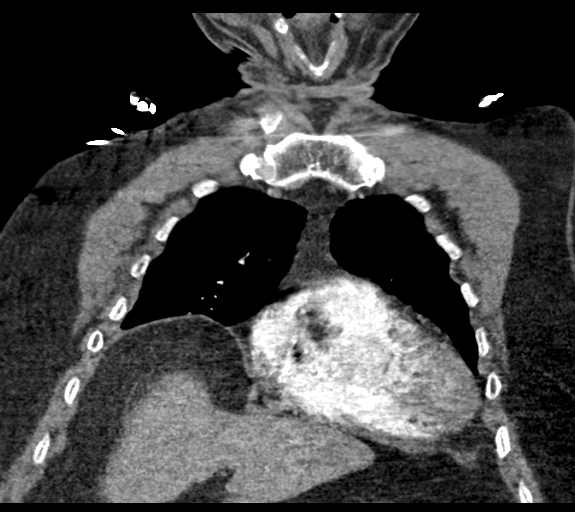
[im 67/133  soft-tissue]
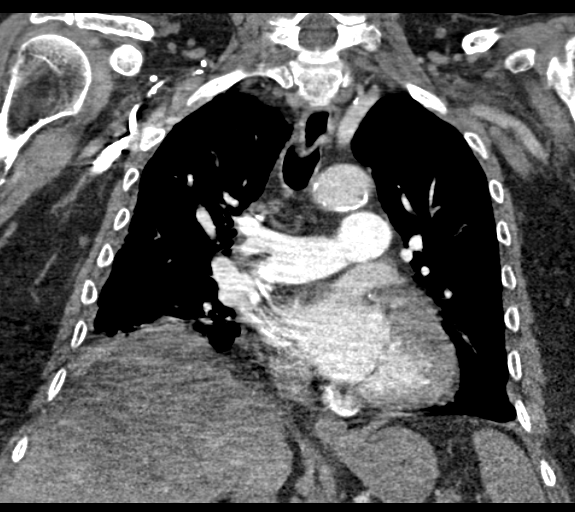
[im 100/133  soft-tissue]
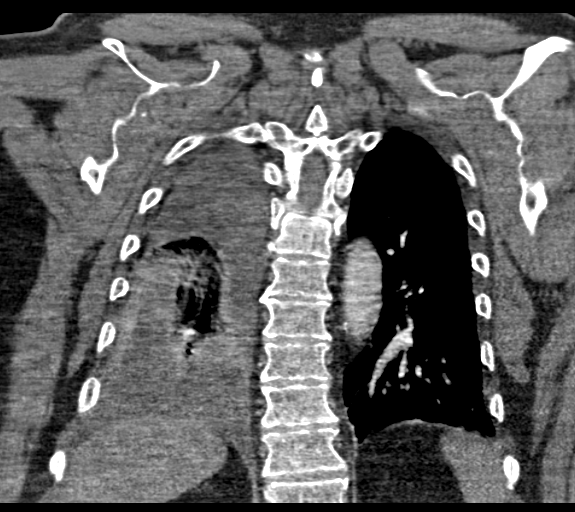

[18 of 46 positions shown; findings below may reference images not displayed]

FINDINGS: Chest wall: No chest wall mass, supraclavicular or axillary
lymphadenopathy. The thyroid gland is grossly normal. The bony
thorax is intact.

Mediastinum: The heart is upper limits of normal in size. No
pericardial effusion. Small scattered mediastinal and hilar lymph
nodes but no mass or overt adenopathy. The aorta is normal in
caliber. No dissection. Stable atherosclerotic calcifications. The
esophagus is grossly normal. The pulmonary arterial tree is fairly
well opacified. No definite filling defects to suggest pulmonary
embolism. Exam is somewhat limited by breathing motion artifact.

Lungs/pleura: There is a moderate-sized right pleural effusion with
significant overlying atelectasis. There are underlying
emphysematous changes.

Upper abdomen:  No significant findings.

Review of the MIP images confirms the above findings.
IMPRESSION: 1. No CT findings for pulmonary embolism.
2. Stable atherosclerotic calcifications involving the aorta but no
aneurysm or dissection. Stable coronary artery calcifications.
3. Moderate size right pleural effusion with overlying atelectasis.
4. No mediastinal or hilar mass or adenopathy.

## 2017-01-26 NOTE — Telephone Encounter (Signed)
Left voicemail for patient to call back. 

## 2017-01-27 NOTE — Telephone Encounter (Signed)
I left a 3rd voicemail for patient to call back. I will send letter for patient to call us.

## 2017-02-05 ENCOUNTER — Emergency Department (HOSPITAL_COMMUNITY): Payer: Medicare Other

## 2017-02-05 ENCOUNTER — Inpatient Hospital Stay (HOSPITAL_COMMUNITY)
Admission: EM | Admit: 2017-02-05 | Discharge: 2017-02-10 | DRG: 190 | Disposition: A | Discharge status: Home Health | Payer: Medicare Other | Attending: Internal Medicine | Admitting: Internal Medicine

## 2017-02-05 ENCOUNTER — Encounter (HOSPITAL_COMMUNITY): Payer: Self-pay

## 2017-02-05 ENCOUNTER — Other Ambulatory Visit: Payer: Self-pay

## 2017-02-05 DIAGNOSIS — I252 Old myocardial infarction: Secondary | ICD-10-CM | POA: Diagnosis not present

## 2017-02-05 DIAGNOSIS — S62291G Other fracture of first metacarpal bone, right hand, subsequent encounter for fracture with delayed healing: Secondary | ICD-10-CM

## 2017-02-05 DIAGNOSIS — J9602 Acute respiratory failure with hypercapnia: Secondary | ICD-10-CM | POA: Diagnosis present

## 2017-02-05 DIAGNOSIS — I480 Paroxysmal atrial fibrillation: Secondary | ICD-10-CM | POA: Diagnosis present

## 2017-02-05 DIAGNOSIS — R296 Repeated falls: Secondary | ICD-10-CM

## 2017-02-05 DIAGNOSIS — E872 Acidosis: Secondary | ICD-10-CM | POA: Diagnosis present

## 2017-02-05 DIAGNOSIS — Z823 Family history of stroke: Secondary | ICD-10-CM

## 2017-02-05 DIAGNOSIS — F22 Delusional disorders: Secondary | ICD-10-CM | POA: Diagnosis present

## 2017-02-05 DIAGNOSIS — J410 Simple chronic bronchitis: Secondary | ICD-10-CM | POA: Diagnosis not present

## 2017-02-05 DIAGNOSIS — J189 Pneumonia, unspecified organism: Secondary | ICD-10-CM | POA: Diagnosis not present

## 2017-02-05 DIAGNOSIS — M069 Rheumatoid arthritis, unspecified: Secondary | ICD-10-CM | POA: Diagnosis present

## 2017-02-05 DIAGNOSIS — I872 Venous insufficiency (chronic) (peripheral): Secondary | ICD-10-CM | POA: Diagnosis present

## 2017-02-05 DIAGNOSIS — F319 Bipolar disorder, unspecified: Secondary | ICD-10-CM | POA: Diagnosis present

## 2017-02-05 DIAGNOSIS — K219 Gastro-esophageal reflux disease without esophagitis: Secondary | ICD-10-CM | POA: Diagnosis present

## 2017-02-05 DIAGNOSIS — M6282 Rhabdomyolysis: Secondary | ICD-10-CM | POA: Diagnosis present

## 2017-02-05 DIAGNOSIS — R748 Abnormal levels of other serum enzymes: Secondary | ICD-10-CM

## 2017-02-05 DIAGNOSIS — G40909 Epilepsy, unspecified, not intractable, without status epilepticus: Secondary | ICD-10-CM

## 2017-02-05 DIAGNOSIS — W19XXXD Unspecified fall, subsequent encounter: Secondary | ICD-10-CM | POA: Diagnosis present

## 2017-02-05 DIAGNOSIS — I272 Pulmonary hypertension, unspecified: Secondary | ICD-10-CM | POA: Diagnosis present

## 2017-02-05 DIAGNOSIS — I10 Essential (primary) hypertension: Secondary | ICD-10-CM | POA: Diagnosis present

## 2017-02-05 DIAGNOSIS — M797 Fibromyalgia: Secondary | ICD-10-CM | POA: Diagnosis present

## 2017-02-05 DIAGNOSIS — M3313 Other dermatomyositis without myopathy: Secondary | ICD-10-CM | POA: Diagnosis present

## 2017-02-05 DIAGNOSIS — I7300 Raynaud's syndrome without gangrene: Secondary | ICD-10-CM | POA: Diagnosis present

## 2017-02-05 DIAGNOSIS — R609 Edema, unspecified: Secondary | ICD-10-CM

## 2017-02-05 DIAGNOSIS — S6291XG Unspecified fracture of right wrist and hand, subsequent encounter for fracture with delayed healing: Secondary | ICD-10-CM | POA: Diagnosis not present

## 2017-02-05 DIAGNOSIS — J9601 Acute respiratory failure with hypoxia: Secondary | ICD-10-CM | POA: Diagnosis present

## 2017-02-05 DIAGNOSIS — Z9181 History of falling: Secondary | ICD-10-CM

## 2017-02-05 DIAGNOSIS — Z818 Family history of other mental and behavioral disorders: Secondary | ICD-10-CM

## 2017-02-05 DIAGNOSIS — I350 Nonrheumatic aortic (valve) stenosis: Secondary | ICD-10-CM | POA: Diagnosis present

## 2017-02-05 DIAGNOSIS — Z9119 Patient's noncompliance with other medical treatment and regimen: Secondary | ICD-10-CM

## 2017-02-05 DIAGNOSIS — E860 Dehydration: Secondary | ICD-10-CM | POA: Diagnosis present

## 2017-02-05 DIAGNOSIS — J449 Chronic obstructive pulmonary disease, unspecified: Secondary | ICD-10-CM

## 2017-02-05 DIAGNOSIS — R600 Localized edema: Secondary | ICD-10-CM | POA: Diagnosis present

## 2017-02-05 DIAGNOSIS — Z8673 Personal history of transient ischemic attack (TIA), and cerebral infarction without residual deficits: Secondary | ICD-10-CM

## 2017-02-05 DIAGNOSIS — Z872 Personal history of diseases of the skin and subcutaneous tissue: Secondary | ICD-10-CM

## 2017-02-05 DIAGNOSIS — R7303 Prediabetes: Secondary | ICD-10-CM | POA: Diagnosis present

## 2017-02-05 DIAGNOSIS — G9340 Encephalopathy, unspecified: Secondary | ICD-10-CM | POA: Diagnosis not present

## 2017-02-05 DIAGNOSIS — J441 Chronic obstructive pulmonary disease with (acute) exacerbation: Secondary | ICD-10-CM | POA: Diagnosis present

## 2017-02-05 DIAGNOSIS — G9341 Metabolic encephalopathy: Secondary | ICD-10-CM | POA: Diagnosis present

## 2017-02-05 DIAGNOSIS — F1120 Opioid dependence, uncomplicated: Secondary | ICD-10-CM | POA: Diagnosis present

## 2017-02-05 DIAGNOSIS — G4730 Sleep apnea, unspecified: Secondary | ICD-10-CM | POA: Diagnosis present

## 2017-02-05 DIAGNOSIS — Z8739 Personal history of other diseases of the musculoskeletal system and connective tissue: Secondary | ICD-10-CM

## 2017-02-05 DIAGNOSIS — S6291XA Unspecified fracture of right wrist and hand, initial encounter for closed fracture: Secondary | ICD-10-CM | POA: Diagnosis present

## 2017-02-05 DIAGNOSIS — D649 Anemia, unspecified: Secondary | ICD-10-CM | POA: Diagnosis present

## 2017-02-05 DIAGNOSIS — Z833 Family history of diabetes mellitus: Secondary | ICD-10-CM

## 2017-02-05 DIAGNOSIS — Z9114 Patient's other noncompliance with medication regimen: Secondary | ICD-10-CM

## 2017-02-05 DIAGNOSIS — Z8249 Family history of ischemic heart disease and other diseases of the circulatory system: Secondary | ICD-10-CM

## 2017-02-05 LAB — RESPIRATORY PANEL BY PCR
Adenovirus: NOT DETECTED
BORDETELLA PERTUSSIS-RVPCR: NOT DETECTED
CORONAVIRUS 229E-RVPPCR: NOT DETECTED
Chlamydophila pneumoniae: NOT DETECTED
Coronavirus HKU1: NOT DETECTED
Coronavirus NL63: NOT DETECTED
Coronavirus OC43: NOT DETECTED
INFLUENZA A-RVPPCR: NOT DETECTED
INFLUENZA B-RVPPCR: NOT DETECTED
METAPNEUMOVIRUS-RVPPCR: NOT DETECTED
Mycoplasma pneumoniae: NOT DETECTED
PARAINFLUENZA VIRUS 2-RVPPCR: NOT DETECTED
Parainfluenza Virus 1: NOT DETECTED
Parainfluenza Virus 3: NOT DETECTED
Parainfluenza Virus 4: NOT DETECTED
RESPIRATORY SYNCYTIAL VIRUS-RVPPCR: NOT DETECTED
RHINOVIRUS / ENTEROVIRUS - RVPPCR: NOT DETECTED

## 2017-02-05 LAB — COMPREHENSIVE METABOLIC PANEL
ALK PHOS: 72 U/L (ref 38–126)
ALT: 22 U/L (ref 17–63)
AST: 55 U/L — AB (ref 15–41)
Albumin: 3.2 g/dL — ABNORMAL LOW (ref 3.5–5.0)
Anion gap: 9 (ref 5–15)
BUN: 9 mg/dL (ref 6–20)
CALCIUM: 8.2 mg/dL — AB (ref 8.9–10.3)
CHLORIDE: 109 mmol/L (ref 101–111)
CO2: 26 mmol/L (ref 22–32)
CREATININE: 1.06 mg/dL (ref 0.61–1.24)
Glucose, Bld: 85 mg/dL (ref 65–99)
Potassium: 3.5 mmol/L (ref 3.5–5.1)
Sodium: 144 mmol/L (ref 135–145)
Total Bilirubin: 0.7 mg/dL (ref 0.3–1.2)
Total Protein: 6.6 g/dL (ref 6.5–8.1)

## 2017-02-05 LAB — URINALYSIS, ROUTINE W REFLEX MICROSCOPIC
BILIRUBIN URINE: NEGATIVE
GLUCOSE, UA: NEGATIVE mg/dL
HGB URINE DIPSTICK: NEGATIVE
Ketones, ur: 5 mg/dL — AB
Leukocytes, UA: NEGATIVE
Nitrite: NEGATIVE
PH: 5 (ref 5.0–8.0)
Protein, ur: NEGATIVE mg/dL
SPECIFIC GRAVITY, URINE: 1.024 (ref 1.005–1.030)

## 2017-02-05 LAB — I-STAT ARTERIAL BLOOD GAS, ED
Acid-Base Excess: 1 mmol/L (ref 0.0–2.0)
BICARBONATE: 28 mmol/L (ref 20.0–28.0)
O2 Saturation: 98 %
PCO2 ART: 53.2 mmHg — AB (ref 32.0–48.0)
PO2 ART: 115 mmHg — AB (ref 83.0–108.0)
TCO2: 30 mmol/L (ref 22–32)
pH, Arterial: 7.328 — ABNORMAL LOW (ref 7.350–7.450)

## 2017-02-05 LAB — MRSA PCR SCREENING: MRSA BY PCR: NEGATIVE

## 2017-02-05 LAB — CBC WITH DIFFERENTIAL/PLATELET
BASOS PCT: 0 %
Basophils Absolute: 0 10*3/uL (ref 0.0–0.1)
EOS ABS: 0 10*3/uL (ref 0.0–0.7)
EOS PCT: 1 %
HCT: 44.4 % (ref 39.0–52.0)
HEMOGLOBIN: 13.9 g/dL (ref 13.0–17.0)
LYMPHS ABS: 2.1 10*3/uL (ref 0.7–4.0)
Lymphocytes Relative: 27 %
MCH: 26.7 pg (ref 26.0–34.0)
MCHC: 31.3 g/dL (ref 30.0–36.0)
MCV: 85.2 fL (ref 78.0–100.0)
MONOS PCT: 14 %
Monocytes Absolute: 1.1 10*3/uL — ABNORMAL HIGH (ref 0.1–1.0)
NEUTROS PCT: 58 %
Neutro Abs: 4.6 10*3/uL (ref 1.7–7.7)
PLATELETS: 243 10*3/uL (ref 150–400)
RBC: 5.21 MIL/uL (ref 4.22–5.81)
RDW: 18.8 % — ABNORMAL HIGH (ref 11.5–15.5)
WBC: 7.8 10*3/uL (ref 4.0–10.5)

## 2017-02-05 LAB — AMMONIA: AMMONIA: 35 umol/L (ref 9–35)

## 2017-02-05 LAB — CBG MONITORING, ED: Glucose-Capillary: 98 mg/dL (ref 65–99)

## 2017-02-05 LAB — VALPROIC ACID LEVEL: Valproic Acid Lvl: 69 ug/mL (ref 50.0–100.0)

## 2017-02-05 LAB — CK: CK TOTAL: 2175 U/L — AB (ref 49–397)

## 2017-02-05 LAB — PROTIME-INR
INR: 1.25
Prothrombin Time: 15.6 seconds — ABNORMAL HIGH (ref 11.4–15.2)

## 2017-02-05 LAB — ETHANOL

## 2017-02-05 MED ORDER — IPRATROPIUM-ALBUTEROL 0.5-2.5 (3) MG/3ML IN SOLN
3.0000 mL | Freq: Four times a day (QID) | RESPIRATORY_TRACT | Status: DC
  Administered 2017-02-05 – 2017-02-07 (×7): 3 mL via RESPIRATORY_TRACT
  Filled 2017-02-05 (×8): qty 3

## 2017-02-05 MED ORDER — SODIUM CHLORIDE 0.9 % IV SOLN
INTRAVENOUS | Status: DC
  Administered 2017-02-05 – 2017-02-07 (×2): via INTRAVENOUS

## 2017-02-05 MED ORDER — VALPROATE SODIUM 500 MG/5ML IV SOLN
250.0000 mg | Freq: Three times a day (TID) | INTRAVENOUS | Status: DC
  Administered 2017-02-05 – 2017-02-08 (×8): 250 mg via INTRAVENOUS
  Filled 2017-02-05 (×10): qty 2.5

## 2017-02-05 MED ORDER — ACETAMINOPHEN 325 MG PO TABS
650.0000 mg | ORAL_TABLET | Freq: Four times a day (QID) | ORAL | Status: DC | PRN
  Administered 2017-02-08 – 2017-02-09 (×2): 650 mg via ORAL
  Filled 2017-02-05 (×2): qty 2

## 2017-02-05 MED ORDER — OXYBUTYNIN CHLORIDE 5 MG PO TABS: 5.0000 mg | ORAL_TABLET | Freq: Every day | ORAL | Status: DC

## 2017-02-05 MED ORDER — ONDANSETRON HCL 4 MG PO TABS: 4.0000 mg | ORAL_TABLET | Freq: Four times a day (QID) | ORAL | Status: DC | PRN

## 2017-02-05 MED ORDER — METHYLPREDNISOLONE SODIUM SUCC 125 MG IJ SOLR
60.0000 mg | Freq: Two times a day (BID) | INTRAMUSCULAR | Status: DC
  Administered 2017-02-05 – 2017-02-08 (×5): 60 mg via INTRAVENOUS
  Filled 2017-02-05 (×5): qty 2

## 2017-02-05 MED ORDER — DIVALPROEX SODIUM ER 500 MG PO TB24
750.0000 mg | ORAL_TABLET | Freq: Every day | ORAL | Status: DC
  Filled 2017-02-05: qty 1

## 2017-02-05 MED ORDER — AMIODARONE HCL IN DEXTROSE 360-4.14 MG/200ML-% IV SOLN
60.0000 mg/h | INTRAVENOUS | Status: AC
  Administered 2017-02-05: 60 mg/h via INTRAVENOUS
  Filled 2017-02-05: qty 200

## 2017-02-05 MED ORDER — VALPROATE SODIUM 500 MG/5ML IV SOLN
250.0000 mg | Freq: Four times a day (QID) | INTRAVENOUS | Status: DC
  Filled 2017-02-05 (×2): qty 2.5

## 2017-02-05 MED ORDER — ACETAMINOPHEN 650 MG RE SUPP: 650.0000 mg | Freq: Four times a day (QID) | RECTAL | Status: DC | PRN

## 2017-02-05 MED ORDER — ALPRAZOLAM 0.5 MG PO TABS: 1.0000 mg | ORAL_TABLET | Freq: Three times a day (TID) | ORAL | Status: DC

## 2017-02-05 MED ORDER — SODIUM CHLORIDE 0.9 % IV SOLN
750.0000 mg | Freq: Two times a day (BID) | INTRAVENOUS | Status: DC
  Administered 2017-02-05 – 2017-02-08 (×6): 750 mg via INTRAVENOUS
  Filled 2017-02-05 (×7): qty 7.5

## 2017-02-05 MED ORDER — AMIODARONE HCL 100 MG PO TABS: 100.0000 mg | ORAL_TABLET | Freq: Every day | ORAL | Status: DC

## 2017-02-05 MED ORDER — AMIODARONE HCL IN DEXTROSE 360-4.14 MG/200ML-% IV SOLN
30.0000 mg/h | INTRAVENOUS | Status: DC
  Administered 2017-02-06 – 2017-02-08 (×3): 30 mg/h via INTRAVENOUS
  Filled 2017-02-05 (×5): qty 200

## 2017-02-05 MED ORDER — HYDRALAZINE HCL 20 MG/ML IJ SOLN: 10.0000 mg | Freq: Four times a day (QID) | INTRAMUSCULAR | Status: DC | PRN

## 2017-02-05 MED ORDER — ENOXAPARIN SODIUM 40 MG/0.4ML ~~LOC~~ SOLN
40.0000 mg | SUBCUTANEOUS | Status: DC
  Administered 2017-02-05 – 2017-02-09 (×5): 40 mg via SUBCUTANEOUS
  Filled 2017-02-05 (×5): qty 0.4

## 2017-02-05 MED ORDER — LEVETIRACETAM 750 MG PO TABS
750.0000 mg | ORAL_TABLET | Freq: Two times a day (BID) | ORAL | Status: DC
  Filled 2017-02-05 (×2): qty 1

## 2017-02-05 MED ORDER — ONDANSETRON HCL 4 MG/2ML IJ SOLN
4.0000 mg | Freq: Four times a day (QID) | INTRAMUSCULAR | Status: DC | PRN
  Administered 2017-02-08 – 2017-02-09 (×2): 4 mg via INTRAVENOUS
  Filled 2017-02-05 (×2): qty 2

## 2017-02-05 MED ORDER — MOMETASONE FURO-FORMOTEROL FUM 200-5 MCG/ACT IN AERO
2.0000 | INHALATION_SPRAY | Freq: Two times a day (BID) | RESPIRATORY_TRACT | Status: DC
  Administered 2017-02-06 – 2017-02-10 (×7): 2 via RESPIRATORY_TRACT
  Filled 2017-02-05: qty 8.8

## 2017-02-05 MED ORDER — SODIUM CHLORIDE 0.9% FLUSH
3.0000 mL | Freq: Two times a day (BID) | INTRAVENOUS | Status: DC
  Administered 2017-02-05 – 2017-02-10 (×8): 3 mL via INTRAVENOUS

## 2017-02-05 NOTE — Progress Notes (Addendum)
Pt confused and unable to answer questions reguarding admission history or flu/pnuemonia vacc. No family at bedside at this time.

## 2017-02-05 NOTE — ED Provider Notes (Signed)
[facility] PROGRESSIVE CARE Provider Note   CSN: [id number] Arrival date & time: 02/05/17  0909     History   Chief Complaint Chief Complaint  Patient presents with  . Fall  . Altered Mental Status    HPI Patrick Holt is a 66 y.o. male.  Pt presents to the ED today with fall and AMS.  EMS has been called out to his house 12/20, 21, and today for falls.  Today, the pt consented to come to the ED.  The pt was seen by me in the ED on 11/29 and was admitted from 11/29-12/5 for AMS which gradually resolved on its own.  The pt said he's been very weak and can't get up if he falls.  He does not know why he falls.  He c/o continued right hand pain from when he fell in November.  Pt was supposed to f/u with Dr. Burney Gauze, but has not.  He is supposed to wear a splint, but does not.  The pt also c/o of his back hurting from the fall.      Past Medical History:  Diagnosis Date  . Anemia   . Anxiety   . Asthma   . Atrial fibrillation ([place]) 01/2012   with RVR  . Bipolar 1 disorder ([place])   . Cancer ([place])   . Collagen vascular disease ([place])   . Conversion disorder 06/2010  . COPD (chronic obstructive pulmonary disease) ([place])   . Dermatomyositis ([place])   . Dermatomyositis ([place])   . Diverticulitis   . Diverticulosis of colon   . Dysrhythmia    "irregular" (11/15/2012)  . Esophageal dysmotility   . Esophageal stricture   . Fibromyalgia   . Gastritis   . GERD (gastroesophageal reflux disease)   . Hand fracture, right    sept 13, 2017, still wearing splint as og 01-10-2017  . Headache(784.0)    "severe; get them often" (11/15/2012)  . Hiatal hernia   . History of narcotic addiction ([place])   . Hx of adenomatous colonic polyps   . Hyperlipidemia   . Hypertension   . Internal hemorrhoids   . Ischemic heart disease   . Major depression    with acute psychotic break in 06/2010  . Myocardial infarct ([place])    mulitple (1999, 2000, 2004)  . Narcotic dependence ([place])   .  Nephrolithiasis   . Obesity   . OCD (obsessive compulsive disorder)   . Otosclerosis   . Paroxysmal A-fib ([place])   . Peripheral neuropathy   . Raynaud's disease   . Renal insufficiency   . Rheumatoid arthritis(714.0)   . Sarcoidosis   . Seizures ([place])   . Subarachnoid hemorrhage ([place]) 01/2012   with subdural  hematoma.   . Type II diabetes mellitus ([place])   . Urge incontinence   . Vertigo     Patient Active Problem List   Diagnosis Date Noted  . Rhabdomyolysis 02/05/2017  . Acute respiratory failure with hypoxia and hypercarbia ([place]) 02/05/2017  . COPD with acute exacerbation ([place]) 02/05/2017  . Bipolar 1 disorder ([place]) 02/05/2017  . Anemia 02/05/2017  . Hypertension 02/05/2017  . Hand fracture, right 02/05/2017  . Narcotic dependence ([place]) 02/05/2017  . Pre-diabetes 02/05/2017  . PAF (paroxysmal atrial fibrillation) ([place]) 02/05/2017  . Frequent falls 02/05/2017  . History of dermatomyositis 02/05/2017  . Seizure disorder ([place]) 02/05/2017  . Bilateral lower extremity edema 02/05/2017  . 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 Altered mental status 01/13/2017  .  Low oxygen saturation   . Pain in right wrist 02/27/2016  . Altered mental state 11/14/2015  . Severe dehydration 11/14/2015  . Hypernatremia 11/14/2015  . Difficulty in walking, not elsewhere classified   . Right wrist fracture   . Closed displaced fracture of ulnar styloid with routine healing   . Scapholunate ligament injury with no instability   . Falls frequently   . Hypoxia 11/02/2015  . Anxiety 06/06/2015  . Impaired mobility 04/28/2015  . Lumbar spondylosis with myelopathy 03/07/2015  . Nocturia 12/03/2014  . Chronic venous insufficiency 09/23/2014  . Poor social situation 07/30/2014  . Schizophrenia, acute ([place])   . Schizoaffective disorder, bipolar type ([place])   . Decreased visual acuity 06/27/2014  . COPD exacerbation ([place])   . Right sided weakness 01/04/2014  . Essential  hypertension, benign   . Atrial fibrillation, unspecified   . Radiculopathy of cervical region   . Polyuria 12/07/2013  . Erectile dysfunction 12/07/2013  . Diverticulitis 08/01/2013  . Acute diverticulitis 08/01/2013  . Hematochezia 06/04/2013  . Hypokalemia 10/03/2012  . Benign neoplasm of colon 09/27/2012  . Stricture and stenosis of esophagus 09/27/2012  . Diverticulitis of colon without hemorrhage 07/26/2012  . Polypharmacy 03/26/2012  . Seizures ([place]) 02/10/2012  . Atrial fibrillation ([place]) 02/05/2012  . Coronary atherosclerosis of native coronary artery 02/05/2012  . Radicular low back pain 03/09/2011  . History of seizure disorder 01/29/2011  . Irritable bowel syndrome (IBS) 06/24/2010  . Urge incontinence 06/19/2010  . TOBACCO USER 11/23/2008  . DEPRESSION, MAJOR 05/31/2008  . ISCHEMIC HEART DISEASE 05/31/2008  . RAYNAUD'S DISEASE 05/31/2008  . HIATAL HERNIA 05/31/2008  . Rheumatoid arthritis ([place]) 05/31/2008  . FIBROMYALGIA 05/31/2008  . Hyperlipidemia 01/26/2008  . Anxiety state 01/26/2008  . HEMORRHOIDS, INTERNAL 01/26/2008  . COPD (chronic obstructive pulmonary disease) ([place]) 01/26/2008  . ESOPHAGEAL MOTILITY DISORDER 01/26/2008  . GERD 01/26/2008  . Dermatomyositis ([place]) 01/26/2008    Past Surgical History:  Procedure Laterality Date  . BACK SURGERY    . CARDIAC CATHETERIZATION    . CARPAL TUNNEL RELEASE Bilateral   . CATARACT EXTRACTION W/ INTRAOCULAR LENS IMPLANT Left   . COLONOSCOPY N/A 09/27/2012   Procedure: COLONOSCOPY;  Surgeon: Lafayette Dragon, MD;  Location: [facility] ENDOSCOPY;  Service: Endoscopy;  Laterality: N/A;  . ESOPHAGOGASTRODUODENOSCOPY N/A 09/27/2012   Procedure: ESOPHAGOGASTRODUODENOSCOPY (EGD);  Surgeon: Lafayette Dragon, MD;  Location: [facility];  Service: Endoscopy;  Laterality: N/A;  . KNEE ARTHROSCOPY W/ MENISCAL REPAIR Left 2009  . LUMBAR DISC SURGERY    . LYMPH NODE DISSECTION Right 1970's   "neck; dr thought I had Hodgkins; turned out  to be sarcoidosis" (11/15/2012)  . squamous papilloma   2010   removed by Dr. Constance Holster ENT, noted on tongue  . TONSILLECTOMY         Home Medications    Prior to Admission medications   Medication Sig Start Date End Date Taking? Authorizing Provider  ALPRAZolam Duanne Moron) 1 MG tablet Take 1 mg by mouth 3 (three) times daily. 01/10/17  Yes [provider]  amiodarone (PACERONE) 200 MG tablet Take 0.5 tablets (100 mg total) by mouth daily. 01/19/17  Yes Velvet Bathe, MD  amLODipine (NORVASC) 5 MG tablet Take 1 tablet (5 mg total) by mouth daily. 01/19/17  Yes Velvet Bathe, MD  aspirin EC 81 MG tablet Take 1 tablet (81 mg total) by mouth daily. 10/17/15  Yes [name], MD  atorvastatin (LIPITOR) 40 MG tablet Take 1 tablet (40 mg  total) by mouth daily. 01/19/17  Yes Velvet Bathe, MD  budesonide-formoterol [facility]) 160-4.5 MCG/ACT inhaler Inhale 2 puffs into the lungs 2 (two) times daily. 01/19/17  Yes Velvet Bathe, MD  divalproex (DEPAKOTE ER) 250 MG 24 hr tablet Take 3 tablets (750 mg total) by mouth daily. 01/19/17  Yes Velvet Bathe, MD  ergocalciferol (VITAMIN D2) 50000 units capsule Take 50,000 Units by mouth once a week.   Yes [provider]  esomeprazole (NEXIUM) 40 MG capsule Take 40 mg by mouth daily. 07/26/16  Yes [provider]  levETIRAcetam (KEPPRA) 750 MG tablet Take 1 tablet (750 mg total) by mouth 2 (two) times daily. Patient taking differently: Take 500 mg by mouth 2 (two) times daily.  01/19/17  Yes Velvet Bathe, MD  loperamide (IMODIUM) 2 MG capsule Take 2 mg by mouth as needed for diarrhea or loose stools.   Yes [provider]  oxybutynin (DITROPAN) 5 MG tablet Take 5 mg by mouth daily. 07/26/16  Yes [provider]  traMADol (ULTRAM) 50 MG tablet Take 50 mg by mouth every 6 (six) hours as needed for moderate pain.   Yes [provider]  ipratropium-albuterol (DUONEB) 0.5-2.5 (3) MG/3ML SOLN Inhale 3 mLs into the lungs  every 6 (six) hours as needed. Patient taking differently: Inhale 3 mLs into the lungs every 6 (six) hours as needed (for breathing).  11/17/15   Elgergawy, Silver Huguenin, MD  Multiple Vitamin (MULTIVITAMIN WITH MINERALS) TABS tablet Take 1 tablet by mouth daily. Patient not taking: Reported on 01/13/2017 10/17/15   [name], MD    Family History Family History  Problem Relation Age of Onset  . Alcohol abuse Mother   . Depression Mother   . Heart disease Mother   . Diabetes Mother   . Stroke Mother   . Coronary artery disease Father   . Diabetes Other        1/2 brother  . Hepatitis Brother     Social History Social History   Tobacco Use  . Smoking status: Former Smoker    Packs/day: 0.50    Years: 30.00    Pack years: 15.00    Types: Cigarettes    Last attempt to quit: 12/15/2014    Years since quitting: 2.1  . Smokeless tobacco: Never Used  . Tobacco comment: 3 cigs a day  Substance Use Topics  . Alcohol use: No    Alcohol/week: 0.0 oz    Comment: Alcohol stopped in September of 2014  . Drug use: No     Allergies   Immune globulins; Ciprofloxacin; Flagyl [metronidazole]; Lisinopril; Sulfa antibiotics; Morphine and related; Requip [ropinirole hcl]; Betamethasone dipropionate; Bupropion hcl; Clobetasol; Codeine; Escitalopram oxalate; Fluoxetine hcl; Furosemide; Meclizine; Paroxetine; Penicillins; Tacrolimus; Tetanus toxoid; and Tuberculin purified protein derivative   Review of Systems Review of Systems  Musculoskeletal: Positive for back pain.       Right hand pain  All other systems reviewed and are negative.    Physical Exam Updated Vital Signs BP 124/75 (BP Location: Right Arm)   Pulse 73   Temp (!) 97.4 F (36.3 C) (Axillary)   Resp 17   Ht 5\' 7"  (1.702 m)   Wt 109.1 kg (240 lb 8.4 oz)   SpO2 97%   BMI 37.67 kg/m   Physical Exam  Constitutional: He is oriented to person, place, and time. He appears lethargic. He appears distressed.  HENT:  Head:  Normocephalic and atraumatic.  Right Ear: External ear normal.  Left  Ear: External ear normal.  Nose: Nose normal.  Mouth/Throat: Mucous membranes are dry.  Eyes: Conjunctivae and EOM are normal. Pupils are equal, round, and reactive to light.  Neck: Normal range of motion. Neck supple.  Cardiovascular: Normal rate, regular rhythm, normal heart sounds and intact distal pulses.  Pulmonary/Chest: Effort normal and breath sounds normal.  Abdominal: Soft. Bowel sounds are normal.  Musculoskeletal: He exhibits edema.  Bilateral LE edema and redness  Neurological: He is oriented to person, place, and time. He appears lethargic.  Pt will awaken to verbal stimuli and is oriented, but then will fall back asleep  Skin: Skin is warm. Capillary refill takes less than 2 seconds.  Psychiatric: He has a normal mood and affect.  Nursing note and vitals reviewed.    ED Treatments / Results  Labs (all labs ordered are listed, but only abnormal results are displayed) Labs Reviewed  COMPREHENSIVE METABOLIC PANEL - Abnormal; Notable for the following components:      Result Value   Calcium 8.2 (*)    Albumin 3.2 (*)    AST 55 (*)    All other components within normal limits  CBC WITH DIFFERENTIAL/PLATELET - Abnormal; Notable for the following components:   RDW 18.8 (*)    Monocytes Absolute 1.1 (*)    All other components within normal limits  PROTIME-INR - Abnormal; Notable for the following components:   Prothrombin Time 15.6 (*)    All other components within normal limits  URINALYSIS, ROUTINE W REFLEX MICROSCOPIC - Abnormal; Notable for the following components:   Color, Urine AMBER (*)    Ketones, ur 5 (*)    All other components within normal limits  CK - Abnormal; Notable for the following components:   Total CK 2,175 (*)    All other components within normal limits  I-STAT ARTERIAL BLOOD GAS, ED - Abnormal; Notable for the following components:   pH, Arterial 7.328 (*)    pCO2  arterial 53.2 (*)    pO2, Arterial 115.0 (*)    All other components within normal limits  MRSA PCR SCREENING  RESPIRATORY PANEL BY PCR  AMMONIA  ETHANOL  VALPROIC ACID LEVEL  RAPID URINE DRUG SCREEN, HOSP PERFORMED  LEVETIRACETAM LEVEL  CBG MONITORING, ED    EKG  EKG Interpretation  Date/Time:  Saturday February 05 2017 09:22:06 EST Ventricular Rate:  70 PR Interval:    QRS Duration: 101 QT Interval:  384 QTC Calculation: 415 R Axis:   -7 Text Interpretation:  Sinus rhythm Low voltage, precordial leads Nonspecific T abnormalities, lateral leads NSR now Confirmed by Isla Pence [phone]) on 02/05/2017 11:02:15 AM       Radiology Dg Chest 2 View  Result Date: 02/05/2017 CLINICAL DATA:  Altered mental status after fall today. EXAM: CHEST  2 VIEW COMPARISON:  Radiograph of January 13, 2017. FINDINGS: Stable cardiomegaly. No pneumothorax is noted. Mild left basilar atelectasis is noted. Mild right pleural effusion is noted with associated atelectasis. Bony thorax is unremarkable. IMPRESSION: Mild bibasilar subsegmental atelectasis with mild right pleural effusion. Electronically Signed   By: Marijo Conception, M.D.   On: 02/05/2017 10:52   Dg Lumbar Spine Complete  Result Date: 02/05/2017 CLINICAL DATA:  Altered mental status after fall today. EXAM: LUMBAR SPINE - COMPLETE 4+ VIEW COMPARISON:  Radiographs of December 17, 2016. FINDINGS: No fracture or spondylolisthesis is noted. Moderate degenerative disc disease is noted at L2-3 with anterior osteophyte formation. Mild degenerative disc disease is noted at L1 to  and L3-4. Atherosclerosis of thoracic aorta is noted. IMPRESSION: Multilevel degenerative disc disease. No acute abnormality seen the lumbar spine. Electronically Signed   By: Marijo Conception, M.D.   On: 02/05/2017 10:54   Dg Wrist Complete Right  Result Date: 02/05/2017 CLINICAL DATA:  Right wrist deformity after fall today. EXAM: RIGHT WRIST - COMPLETE 3+ VIEW  COMPARISON:  Radiographs of January 15, 2017. FINDINGS: Stable widening of scapholunate space is noted. Continued presence of distal first metacarpal fracture is noted. Stable deformity of distal radius and ulna is noted consistent with old healed fractures. No new abnormality is noted. IMPRESSION: Stable distal first metacarpal fracture as well as stable deformity of distal right radius and ulna consistent with old healed fractures. No new abnormality is noted. Electronically Signed   By: Marijo Conception, M.D.   On: 02/05/2017 11:00   Ct Head Wo Contrast  Result Date: 02/05/2017 CLINICAL DATA:  Altered mental status beginning this morning. 01/13/2017 EXAM: CT HEAD WITHOUT CONTRAST TECHNIQUE: Contiguous axial images were obtained from the base of the skull through the vertex without intravenous contrast. COMPARISON:  None. FINDINGS: Brain: No evidence of acute infarction, hemorrhage, hydrocephalus, extra-axial collection or mass lesion/mass effect. Ventricular sulcal enlargement is noted consistent with mild atrophy. There are patchy areas of white matter hypoattenuation consistent with mild chronic microvascular ischemic change. A small old lacune infarct is noted in the pons centrally. These findings are stable. Vascular: No hyperdense vessel or unexpected calcification. Skull: Normal. Negative for fracture or focal lesion. Sinuses/Orbits: Globes and orbits are unremarkable. Sinuses and mastoid air cells are clear. Other: None. IMPRESSION: 1. No acute intracranial abnormalities. 2. Mild atrophy and chronic microvascular ischemic change. Stable appearance from the recent prior exam. Electronically Signed   By: Lajean Manes M.D.   On: 02/05/2017 10:10   Dg Hand Complete Right  Result Date: 02/05/2017 CLINICAL DATA:  Right wrist deformity after fall today. EXAM: RIGHT HAND - COMPLETE 3+ VIEW COMPARISON:  Radiograph of January 15, 2017. FINDINGS: Continued presence of moderately displaced fracture involving  distal portion of first metacarpal. Stable widening of scapholunate space is noted. No new fracture or dislocation is noted. IMPRESSION: Continued presence of moderately displaced distal first metacarpal fracture. Stable widening of scapholunate space is noted suggesting ligamentous injury. No new abnormality is noted. Electronically Signed   By: Marijo Conception, M.D.   On: 02/05/2017 10:55    Procedures Procedures (including critical care time)  Medications Ordered in ED Medications  0.9 %  sodium chloride infusion ( Intravenous Rate/Dose Verify 02/05/17 1327)  ipratropium-albuterol (DUONEB) 0.5-2.5 (3) MG/3ML nebulizer solution 3 mL (3 mLs Nebulization Given 02/05/17 1511)  methylPREDNISolone sodium succinate (SOLU-MEDROL) 125 mg/2 mL injection 60 mg (not administered)  ALPRAZolam (XANAX) tablet 1 mg (not administered)  amiodarone (PACERONE) tablet 100 mg (100 mg Oral Not Given 02/05/17 1451)  mometasone-formoterol (DULERA) 200-5 MCG/ACT inhaler 2 puff (2 puffs Inhalation Not Given 02/05/17 1218)  divalproex (DEPAKOTE ER) 24 hr tablet 750 mg (750 mg Oral Not Given 02/05/17 1451)  levETIRAcetam (KEPPRA) tablet 750 mg (750 mg Oral Not Given 02/05/17 1451)  oxybutynin (DITROPAN) tablet 5 mg (5 mg Oral Not Given 02/05/17 1452)  enoxaparin (LOVENOX) injection 40 mg (not administered)  sodium chloride flush (NS) 0.9 % injection 3 mL (not administered)  acetaminophen (TYLENOL) tablet 650 mg (not administered)    Or  acetaminophen (TYLENOL) suppository 650 mg (not administered)  ondansetron (ZOFRAN) tablet 4 mg (not administered)    Or  ondansetron (ZOFRAN) injection 4 mg (not administered)  hydrALAZINE (APRESOLINE) injection 10 mg (not administered)     Initial Impression / Assessment and Plan / ED Course  I have reviewed the triage vital signs and the nursing notes.  Pertinent labs & imaging results that were available during my care of the patient were reviewed by me and considered in my  medical decision making (see chart for details).  CRITICAL CARE Performed by: Isla Pence   Total critical care time: 30 minutes  Critical care time was exclusive of separately billable procedures and treating other patients.  Critical care was necessary to treat or prevent imminent or life-threatening deterioration.  Critical care was time spent personally by me on the following activities: development of treatment plan with patient and/or surrogate as well as nursing, discussions with consultants, evaluation of patient's response to treatment, examination of patient, obtaining history from patient or surrogate, ordering and performing treatments and interventions, ordering and review of laboratory studies, ordering and review of radiographic studies, pulse oximetry and re-evaluation of patient's condition.    Pt's O2 sats still low on 6L [place], so I ordered bipap.  The pt does have a hx of chronically elevated ck, so that does not appear new.  The pt d/w triad hospitalist for admission.  Final Clinical Impressions(s) / ED Diagnoses   Final diagnoses:  Acute respiratory failure with hypoxia and hypercapnia (HCC)  Metabolic encephalopathy  Peripheral edema  Elevated creatine kinase    ED Discharge Orders    None       Isla Pence, MD 02/05/17 1546

## 2017-02-05 NOTE — ED Notes (Signed)
Attempted to call report

## 2017-02-05 NOTE — Progress Notes (Signed)
Pt placed on low bed with mats on either side of the floor, bed alarm is on and pt has yellow socks and yellow arm band on.

## 2017-02-05 NOTE — ED Notes (Signed)
RT called to help transport pt on bipap.

## 2017-02-05 NOTE — ED Notes (Signed)
Pt incontinent of urine. Peri care performed and new incontinence pads applied.

## 2017-02-05 NOTE — Progress Notes (Signed)
Pt's buttocks and sacrum are pink and blanchable, sacral foam applied.

## 2017-02-05 NOTE — H&P (Signed)
History and Physical    Patrick Holt WVP:[id number] DOB: [birth year] DOA: 02/05/2017   PCP: Medicine, [facility]   Attending physician: Evangeline Gula  Patient coming from/Resides with: Private residence/alone  Chief Complaint: Frequent falls/rhabdomyolysis/acute hypoxemic and hypercarbic respiratory failure with COPD  HPI: Patrick Holt is a 66 y.o. male with medical history significant for bipolar disorder noncompliant with medications, PAF on amiodarone, COPD, nonhealing right hand fracture since 2017 in the context of recurrent falls, dermatomyositis, prediabetes, anemia, history of narcotic dependence, hypertension, and seizure disorder on AEDs.  He was recently discharged on 12/4 after an admission for seizure versus syncopal episode at home.  He was found to have mild rhabdomyolysis with elevated CK and LFTs and was treated with IV fluid hydration.  His Depakote level was subtherapeutic but this is being utilized for his bipolar disorder.  His Keppra level was therapeutic.  He was also evaluated by psychiatry during the previous admission was deemed hypomanic but not a threat to himself or others so no indication for acute inpatient hospitalization.  In review of the CSW notes plan was to offer patient a SNF bed but patient essentially refused stating he was concerned about personal items including medications and blank checks being stolen from his home.  He also had a multitude of other reasons why he could not be placed in a facility (see note dated 12/3) and therefore was discharged back to home.  According to current ER note, EMS has been called to the patient's house multiple times in the past 3 days (12/20, 12/21, and today) full reports of falls.  On arrival to the ER he was complaining of back pain.  EMS also documented patient with hypoxemia on room air with saturations in the mid 80s therefore nasal cannula oxygen was applied at 6 L/min.  ABG revealed mild  respiratory acidosis with a pH is 7.33 and a P CO2 of 53.2.  BiPAP was applied by the EDP.  Chest x-ray revealed mild bibasilar subsegmental atelectasis with mild right pleural effusion and based on clinical exam EDP suspected patient had underlying COPD exacerbation.  His renal function was normal the CK was elevated at 2175.  He will be admitted primarily due to his acute hypoxemic and hypercarbic respiratory failure in the context of COPD exacerbation.  Was primarily complaining of back pain and right hand pain to the EDP  ED Course:  Vital Signs: BP 132/71   Pulse 75   Temp 97.9 F (36.6 C) (Oral)   Resp 14   Ht 5\' 7"  (1.702 m)   Wt 110.7 kg (244 lb)   SpO2 97%   BMI 38.22 kg/m  CT head: Negative for acute findings Chest x-ray: As above DG lumbar spine: No acute findings DG right wrist: Stable distal first metacarpal fracture as well as stable deformity of distal right radius and consistent with old healed fractures-no acute abnormality DG right hand: Continued presence of moderately displaced distal first metacarpal fracture with stable widening of the scapholunate space suggesting ligamentous injury with no new abnormality noted Lab data: Sodium 144, potassium 3.5, CO2 26, chloride 109, glucose 85, BUN 9, creatinine 1.06, AST 55, CK 2175, white count 7800 with normal differential, hemoglobin 13.9, platelets 243,000, coags normal; current level therapeutic at 69, urinalysis unremarkable except for 5 ketones, alcohol level <10 Medications and treatments: None except for application of oxygen/BiPAP  Review of Systems:  **Unable to obtain from patient secondary to acute altered mental status in the  context of mild hypercarbia and underlying psychiatric issues   Past Medical History:  Diagnosis Date  . Anemia   . Anxiety   . Asthma   . Atrial fibrillation ([place]) 01/2012   with RVR  . Bipolar 1 disorder ([place])   . Cancer ([place])   . Collagen vascular disease ([place])   . 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 COPD (chronic obstructive pulmonary disease) ([place])   . Dermatomyositis ([place])   . Dermatomyositis ([place])   . Diverticulitis   . Diverticulosis of colon   . Dysrhythmia    "irregular" (11/15/2012)  . Esophageal dysmotility   . Esophageal stricture   . Fibromyalgia   . Gastritis   . GERD (gastroesophageal reflux disease)   . Hand fracture, right    sept 13, 2017, still wearing splint as og 01-10-2017  . Headache(784.0)    "severe; get them often" (11/15/2012)  . Hiatal hernia   . History of narcotic addiction ([place])   . Hx of adenomatous colonic polyps   . Hyperlipidemia   . Hypertension   . Internal hemorrhoids   . Ischemic heart disease   . Major depression    with acute psychotic break in 06/2010  . Myocardial infarct ([place])    mulitple (1999, 2000, 2004)  . Narcotic dependence ([place])   . Nephrolithiasis   . Obesity   . OCD (obsessive compulsive disorder)   . Otosclerosis   . Paroxysmal A-fib ([place])   . Peripheral neuropathy   . Raynaud's disease   . Renal insufficiency   . Rheumatoid arthritis(714.0)   . Sarcoidosis   . Seizures ([place])   . Subarachnoid hemorrhage ([place]) 01/2012   with subdural  hematoma.   . Type II diabetes mellitus ([place])   . Urge incontinence   . Vertigo     Past Surgical History:  Procedure Laterality Date  . BACK SURGERY    . CARDIAC CATHETERIZATION    . CARPAL TUNNEL RELEASE Bilateral   . CATARACT EXTRACTION W/ INTRAOCULAR LENS IMPLANT Left   . COLONOSCOPY N/A 09/27/2012   Procedure: COLONOSCOPY;  Surgeon: Lafayette Dragon, MD;  Location: [facility];  Service: Endoscopy;  Laterality: N/A;  . ESOPHAGOGASTRODUODENOSCOPY N/A 09/27/2012   Procedure: ESOPHAGOGASTRODUODENOSCOPY (EGD);  Surgeon: Lafayette Dragon, MD;  Location: [facility];  Service: Endoscopy;  Laterality: N/A;  . KNEE ARTHROSCOPY W/ MENISCAL REPAIR Left 2009  . LUMBAR DISC SURGERY    . LYMPH NODE DISSECTION Right 1970's   "neck; dr thought I had Hodgkins; turned out to  be sarcoidosis" (11/15/2012)  . squamous papilloma   2010   removed by Dr. Constance Holster ENT, noted on tongue  . TONSILLECTOMY      Social History   Socioeconomic History  . Marital status: Divorced    Spouse name: Not on file  . Number of children: Not on file  . Years of education: Not on file  . Highest education level: Not on file  Social Needs  . Financial resource strain: Not on file  . Food insecurity - worry: Not on file  . Food insecurity - inability: Not on file  . Transportation needs - medical: Not on file  . Transportation needs - non-medical: Not on file  Occupational History  . Occupation: disabled  Tobacco Use  . Smoking status: Former Smoker    Packs/day: 0.50    Years: 30.00    Pack years: 15.00    Types: Cigarettes    Last attempt to quit: 12/15/2014    Years  since quitting: 2.1  . Smokeless tobacco: Never Used  . Tobacco comment: 3 cigs a day  Substance and Sexual Activity  . Alcohol use: No    Alcohol/week: 0.0 oz    Comment: Alcohol stopped in September of 2014  . Drug use: No  . Sexual activity: No  Other Topics Concern  . Not on file  Social History Narrative   The patient was born and raised by both his biological parents in the [place] area. His parents did separate in the past but never divorced. He denies any history of any physical or sexual abuse. The patient says he dropped out of school in the 12th grade. 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 He has never been married and has no children. He currently lives alone in a duplex in [place].    Mobility: Independent but with frequent falls at home Work history: Disabled   Allergies  Allergen Reactions  . Immune Globulins Other (See Comments)    Acute renal failure  . Ciprofloxacin Swelling  . Flagyl [Metronidazole] Swelling  . Lisinopril Diarrhea  . Sulfa Antibiotics Other (See Comments)    blisters  .  Morphine And Related     Does not help with pain  . Requip [Ropinirole Hcl]     constipation  . Betamethasone Dipropionate Other (See Comments)    Unknown  . Bupropion Hcl Other (See Comments)    Unknown  . Clobetasol Other (See Comments)    Unknown  . Codeine Other (See Comments)    Does not help with pain  . Escitalopram Oxalate Other (See Comments)    Unknown  . Fluoxetine Hcl Other (See Comments)    Unknown  . Furosemide Other (See Comments)    Unknown  . Meclizine Rash  . Paroxetine Other (See Comments)    Unknown  . Penicillins Other (See Comments)    Unknown reaction Has patient had a PCN reaction causing immediate rash, facial/tongue/throat swelling, SOB or lightheadedness with hypotension: unknown Has patient had a PCN reaction causing severe rash involving mucus membranes or skin necrosis: unknown Has patient had a PCN reaction that required hospitalization unknown Has patient had a PCN reaction occurring within the last 10 years: unknown If all of the above answers are "NO", then may proceed with Cephalosporin use. Unknown Unknown reactio  . Tacrolimus Other (See Comments)    Unknown  . Tetanus Toxoid Other (See Comments)    Unknown  . Tuberculin Purified Protein Derivative Other (See Comments)    Unknown    Family History  Problem Relation Age of Onset  . Alcohol abuse Mother   . Depression Mother   . Heart disease Mother   . Diabetes Mother   . Stroke Mother   . Coronary artery disease Father   . Diabetes Other        1/2 brother  . Hepatitis Brother      Prior to Admission medications   Medication Sig Start Date End Date Taking? Authorizing Provider  ALPRAZolam Duanne Moron) 1 MG tablet Take 1 mg by mouth 3 (three) times daily. 01/10/17   [provider]  amiodarone (PACERONE) 200 MG tablet Take 0.5 tablets (100 mg total) by mouth daily. 01/19/17   Velvet Bathe, MD  amLODipine (NORVASC) 5 MG tablet Take 1 tablet (5 mg total) by mouth daily.  01/19/17   Velvet Bathe, MD  aspirin EC 81 MG tablet Take 1  tablet (81 mg total) by mouth daily. 10/17/15   [name], MD  atorvastatin (LIPITOR) 40 MG tablet Take 1 tablet (40 mg total) by mouth daily. 01/19/17   Velvet Bathe, MD  budesonide-formoterol [facility]) 160-4.5 MCG/ACT inhaler Inhale 2 puffs into the lungs 2 (two) times daily. 01/19/17   Velvet Bathe, MD  divalproex (DEPAKOTE ER) 250 MG 24 hr tablet Take 3 tablets (750 mg total) by mouth daily. 01/19/17   Velvet Bathe, MD  esomeprazole (NEXIUM) 40 MG capsule Take 40 mg by mouth daily. 07/26/16   [provider]  ipratropium-albuterol (DUONEB) 0.5-2.5 (3) MG/3ML SOLN Inhale 3 mLs into the lungs every 6 (six) hours as needed. Patient taking differently: Inhale 3 mLs into the lungs every 6 (six) hours as needed (for breathing).  11/17/15   Elgergawy, Silver Huguenin, MD  levETIRAcetam (KEPPRA) 750 MG tablet Take 1 tablet (750 mg total) by mouth 2 (two) times daily. 01/19/17   Velvet Bathe, MD  Multiple Vitamin (MULTIVITAMIN WITH MINERALS) TABS tablet Take 1 tablet by mouth daily. Patient not taking: Reported on 01/13/2017 10/17/15   [name], MD  oxybutynin (DITROPAN) 5 MG tablet Take 5 mg by mouth daily. 07/26/16   [provider]  Potassium Chloride ER 20 MEQ TBCR Take 1 tablet by mouth every other day.  11/29/14   [provider]    Physical Exam: Vitals:   02/05/17 1115 02/05/17 1130 02/05/17 1145 02/05/17 1200  BP: 125/76 124/71 125/66 132/71  Pulse: 70 72 74 75  Resp: 13 13 13 14   Temp:      TempSrc:      SpO2: 98% 97% 97% 97%  Weight:      Height:          Constitutional: Pale appearing with altered mental status; unkempt appearance noticing soiled /dirty clothing removed by nursing personnel; patient unshaven Eyes: PERRL, lids and conjunctivae normal ENMT: Unable to complete exam due to application of BiPAP mask Neck: normal, supple, no masses, no thyromegaly Respiratory: Diminished throughout  with a few expiratory wheezes-no tachypnea and no accessory muscle use.  BiPAP Cardiovascular: Regular rate and rhythm, no murmurs / rubs / gallops.  Tight, 2+ bilateral extremity edema extending from knees to feet. 2+ pedal pulses. No carotid bruits.  Abdomen: no tenderness, no masses palpated. No hepatosplenomegaly. Bowel sounds positive.  Musculoskeletal: no clubbing / cyanosis. No joint deformity upper and lower extremities. Good ROM, no contractures. Normal muscle tone.  Skin: Unremarkable except for reddened skin changes to bilateral lower extremities with scattered papular lesions that are intact and nondraining in the skin changes are consistent with stasis dermatitis Neurologic: CN 2-12 appear to be grossly intact on visual inspection. Sensation grossly intact, DTR normal.  Unable to test strength due to patient's inability to participate Psychiatric: Patient sleepy/lethargic and not opening eyes and not attempting to respond verbally to questions asked.   Labs on Admission: I have personally reviewed following labs and imaging studies  CBC: Recent Labs  Lab 02/05/17 0919  WBC 7.8  NEUTROABS 4.6  HGB 13.9  HCT 44.4  MCV 85.2  PLT 161   Basic Metabolic Panel: Recent Labs  Lab 02/05/17 0919  NA 144  K 3.5  CL 109  CO2 26  GLUCOSE 85  BUN 9  CREATININE 1.06  CALCIUM 8.2*   GFR: Estimated Creatinine Clearance: 80.3 mL/min (by C-G formula based on SCr of 1.06 mg/dL). Liver Function Tests: Recent Labs  Lab 02/05/17 0919  AST 55*  ALT 22  ALKPHOS 72  BILITOT 0.7  PROT 6.6  ALBUMIN 3.2*   No results for input(s): LIPASE, AMYLASE in the last 168 hours. Recent Labs  Lab 02/05/17 0919  AMMONIA 35   Coagulation Profile: Recent Labs  Lab 02/05/17 0919  INR 1.25   Cardiac Enzymes: Recent Labs  Lab 02/05/17 0919  CKTOTAL 2,175*   BNP (last 3 results) No results for input(s): PROBNP in the last 8760 hours. HbA1C: No results for input(s): HGBA1C in the  last 72 hours. CBG: Recent Labs  Lab 02/05/17 0925  GLUCAP 98   Lipid Profile: No results for input(s): CHOL, HDL, LDLCALC, TRIG, CHOLHDL, LDLDIRECT in the last 72 hours. Thyroid Function Tests: No results for input(s): TSH, T4TOTAL, FREET4, T3FREE, THYROIDAB in the last 72 hours. Anemia Panel: No results for input(s): VITAMINB12, FOLATE, FERRITIN, TIBC, IRON, RETICCTPCT in the last 72 hours. Urine analysis:    Component Value Date/Time   COLORURINE AMBER (A) 02/05/2017 0920   APPEARANCEUR CLEAR 02/05/2017 0920   LABSPEC 1.024 02/05/2017 0920   PHURINE 5.0 02/05/2017 0920   GLUCOSEU NEGATIVE 02/05/2017 0920   HGBUR NEGATIVE 02/05/2017 0920   HGBUR negative 12/11/2009 1356   BILIRUBINUR NEGATIVE 02/05/2017 0920   BILIRUBINUR MODERATE 06/05/2015 1407   KETONESUR 5 (A) 02/05/2017 0920   PROTEINUR NEGATIVE 02/05/2017 0920   UROBILINOGEN 2.0 06/05/2015 1407   UROBILINOGEN 1.0 12/30/2014 1830   NITRITE NEGATIVE 02/05/2017 0920   LEUKOCYTESUR NEGATIVE 02/05/2017 0920   Sepsis Labs: @LABRCNTIP (procalcitonin:4,lacticidven:4) )No results found for this or any previous visit (from the past 240 hour(s)).   Radiological Exams on Admission: Dg Chest 2 View  Result Date: 02/05/2017 CLINICAL DATA:  Altered mental status after fall today. EXAM: CHEST  2 VIEW COMPARISON:  Radiograph of January 13, 2017. FINDINGS: Stable cardiomegaly. No pneumothorax is noted. Mild left basilar atelectasis is noted. Mild right pleural effusion is noted with associated atelectasis. Bony thorax is unremarkable. IMPRESSION: Mild bibasilar subsegmental atelectasis with mild right pleural effusion. Electronically Signed   By: Marijo Conception, M.D.   On: 02/05/2017 10:52   Dg Lumbar Spine Complete  Result Date: 02/05/2017 CLINICAL DATA:  Altered mental status after fall today. EXAM: LUMBAR SPINE - COMPLETE 4+ VIEW COMPARISON:  Radiographs of December 17, 2016. FINDINGS: No fracture or spondylolisthesis is noted.  Moderate degenerative disc disease is noted at L2-3 with anterior osteophyte formation. Mild degenerative disc disease is noted at L1 to and L3-4. Atherosclerosis of thoracic aorta is noted. IMPRESSION: Multilevel degenerative disc disease. No acute abnormality seen the lumbar spine. Electronically Signed   By: Marijo Conception, M.D.   On: 02/05/2017 10:54   Dg Wrist Complete Right  Result Date: 02/05/2017 CLINICAL DATA:  Right wrist deformity after fall today. EXAM: RIGHT WRIST - COMPLETE 3+ VIEW COMPARISON:  Radiographs of January 15, 2017. FINDINGS: Stable widening of scapholunate space is noted. Continued presence of distal first metacarpal fracture is noted. Stable deformity of distal radius and ulna is noted consistent with old healed fractures. No new abnormality is noted. IMPRESSION: Stable distal first metacarpal fracture as well as stable deformity of distal right radius and ulna consistent with old healed fractures. No new abnormality is noted. Electronically Signed   By: Marijo Conception, M.D.   On: 02/05/2017 11:00   Ct Head Wo Contrast  Result Date: 02/05/2017 CLINICAL DATA:  Altered mental status beginning this morning. 01/13/2017 EXAM: CT HEAD WITHOUT CONTRAST TECHNIQUE: Contiguous axial images were obtained from the base  of the skull through the vertex without intravenous contrast. COMPARISON:  None. FINDINGS: Brain: No evidence of acute infarction, hemorrhage, hydrocephalus, extra-axial collection or mass lesion/mass effect. Ventricular sulcal enlargement is noted consistent with mild atrophy. There are patchy areas of white matter hypoattenuation consistent with mild chronic microvascular ischemic change. A small old lacune infarct is noted in the pons centrally. These findings are stable. Vascular: No hyperdense vessel or unexpected calcification. Skull: Normal. Negative for fracture or focal lesion. Sinuses/Orbits: Globes and orbits are unremarkable. Sinuses and mastoid air cells are  clear. Other: None. IMPRESSION: 1. No acute intracranial abnormalities. 2. Mild atrophy and chronic microvascular ischemic change. Stable appearance from the recent prior exam. Electronically Signed   By: Lajean Manes M.D.   On: 02/05/2017 10:10   Dg Hand Complete Right  Result Date: 02/05/2017 CLINICAL DATA:  Right wrist deformity after fall today. EXAM: RIGHT HAND - COMPLETE 3+ VIEW COMPARISON:  Radiograph of January 15, 2017. FINDINGS: Continued presence of moderately displaced fracture involving distal portion of first metacarpal. Stable widening of scapholunate space is noted. No new fracture or dislocation is noted. IMPRESSION: Continued presence of moderately displaced distal first metacarpal fracture. Stable widening of scapholunate space is noted suggesting ligamentous injury. No new abnormality is noted. Electronically Signed   By: Marijo Conception, M.D.   On: 02/05/2017 10:55    EKG: (Independently reviewed) sinus rhythm with ventricular rate 70 bpm, QTC 415 ms, normal R wave rotation, stable chronic T wave inversion in leads V5 and V6, no acute ischemic changes  Assessment/Plan Principal Problem:   Acute respiratory failure with hypoxia and hypercarbia/ COPD with acute exacerbation/severe pulmonary hypertension -Presented to ER after frequent falls and was found to be hypoxemic and hypercarbic with clinical exam consistent with likely COPD exacerbation -BiPAP -Scheduled DuoNeb's -Low-dose IV Solu-Medrol 60 mg every 12 hours-use with caution in patient with bipolar disorder/increased risk for steroid-induced psychosis -Continue preadmission Symbicort and/or therapeutic substitution -Continuous pulse oximetry -Wean and discontinue BiPAP as tolerated -No focal infiltrate, no fever or leukocytosis so no indication to treat as pneumonia -For completeness of evaluation obtain respiratory viral panel  Active Problems:   Rhabdomyolysis/Frequent falls -Recently hospitalized for same -CK  at time of that admission was 3532 and had decreased to 1119 at time of discharge -CK 2175 with normal renal function -Gentle IV fluid hydration -Patient offered SNF placement at least for rehab during previous admission but given his underlying psych condition and paranoia refused placement and insisted on returning home -Likely will need repeat PT/OT evaluation but given his underlying mental state it is unlikely he will accept placement out of his home (see below) -Hold statin for now given low-grade concomitant elevation in AST     Moderate aortic stenosis/history of reported syncope -Has associated severe pulmonary hypertension 75 mmHg -Vmax 1.48 cm -Potentially contributing to recurrent falls in the context of dehydration and medication noncompliance    Bipolar 1 disorder  -Evaluated by psychiatrist during previous admission and deemed hypomanic but not a threat to self or others so not a candidate for inpatient hospitalization -Also documented noncompliance with medications -Current Depakote level therapeutic -Continue preadmission Xanax and Depakote -May require a repeat psychiatric evaluation this admission    Hypertension -Current blood pressure controlled -Given dehydration we will hold admission Norvasc until euvolemic -Provide IV hydralazine prn    Narcotic dependence  -Reported problem in the past -UDS pending    PAF (paroxysmal atrial fibrillation)  -Currently maintaining sinus rhythm on amiodarone -  AST slightly elevated in the context of acute rhabdomyolysis but will continue amiodarone for now and can follow LFTs-if trend becomes upper may need to hold amiodarone -Due to frequent falls and noncompliance he is not a candidate for chronic anticoagulation    History of dermatomyositis -Does not follow-up with rheumatology services secondary to being fired from multiple practices in the context of frequent no-shows; that being said he is not on therapy for this  condition    Bilateral lower extremity edema -Appears to have associated stasis dermatitis -Echocardiogram completed during previous hospitalization as follows: Hyperdynamic ejection fraction 65-70% with mild focal basal hypertrophy of the septum, grade 1 diastolic dysfunction, moderate aortic stenosis, and severe pulmonary hypertension    Anemia -Hemoglobin stable at 13.9    Hand fracture, right -Appears to be an ongoing problem since 2017 -X-rays today show no acute fracture but ligamentous injury -He has been instructed to follow-up with his orthopedic physician Dr. Burney Gauze on multiple occasions but he has yet to do so -He has also been noncompliant with application of splint    Pre-diabetes -Hemoglobin A1c was 5.9 during previous admission    Seizure disorder  -Previous admission unclear if patient had true syncope versus unwitnessed seizure activity -Keppra level was normal at that presentation-repeat this admission -Continue Keppra-if not awake enough to swallow pill with sip of water will need to convert to IV form    History of CVA -On aspirin and statin prior to admission      DVT prophylaxis: Lovenox Code Status: Full Family Communication: No family at bedside Disposition Plan: TBD Consults called: None    Ledonna Dormer L. ANP-BC [facility] Pager [phone]   If 7PM-7AM, please contact night-coverage [email or web address] Password [email or web address]  02/05/2017, 12:20 PM

## 2017-02-05 NOTE — ED Notes (Addendum)
Pt on Bipap; alert to voice and opens eye and lightly grips hands when asked, but not answering questions at this time.

## 2017-02-05 NOTE — ED Triage Notes (Signed)
Pt brought in by EMS due to having a fall this morning. Per EMS, pt has had generalized weakness for the past 3-4 days. Pts BS was 476. Pt a&ox2.

## 2017-02-05 NOTE — Progress Notes (Signed)
Pt is NPO on bipap, When bipap removed for oral care pt is not awake enough to swallow pills. Will notify MD to see if some medications can be changed to IV

## 2017-02-06 ENCOUNTER — Other Ambulatory Visit: Payer: Self-pay

## 2017-02-06 DIAGNOSIS — R600 Localized edema: Secondary | ICD-10-CM

## 2017-02-06 DIAGNOSIS — R748 Abnormal levels of other serum enzymes: Secondary | ICD-10-CM

## 2017-02-06 DIAGNOSIS — F1120 Opioid dependence, uncomplicated: Secondary | ICD-10-CM

## 2017-02-06 DIAGNOSIS — M6282 Rhabdomyolysis: Secondary | ICD-10-CM

## 2017-02-06 DIAGNOSIS — F319 Bipolar disorder, unspecified: Secondary | ICD-10-CM

## 2017-02-06 DIAGNOSIS — R296 Repeated falls: Secondary | ICD-10-CM

## 2017-02-06 DIAGNOSIS — G40909 Epilepsy, unspecified, not intractable, without status epilepticus: Secondary | ICD-10-CM

## 2017-02-06 DIAGNOSIS — S6291XG Unspecified fracture of right wrist and hand, subsequent encounter for fracture with delayed healing: Secondary | ICD-10-CM

## 2017-02-06 DIAGNOSIS — J9602 Acute respiratory failure with hypercapnia: Secondary | ICD-10-CM

## 2017-02-06 DIAGNOSIS — Z872 Personal history of diseases of the skin and subcutaneous tissue: Secondary | ICD-10-CM

## 2017-02-06 DIAGNOSIS — I480 Paroxysmal atrial fibrillation: Secondary | ICD-10-CM

## 2017-02-06 DIAGNOSIS — J441 Chronic obstructive pulmonary disease with (acute) exacerbation: Principal | ICD-10-CM

## 2017-02-06 DIAGNOSIS — J9601 Acute respiratory failure with hypoxia: Secondary | ICD-10-CM

## 2017-02-06 DIAGNOSIS — R7303 Prediabetes: Secondary | ICD-10-CM

## 2017-02-06 DIAGNOSIS — G9341 Metabolic encephalopathy: Secondary | ICD-10-CM

## 2017-02-06 DIAGNOSIS — I10 Essential (primary) hypertension: Secondary | ICD-10-CM

## 2017-02-06 LAB — COMPREHENSIVE METABOLIC PANEL
ALBUMIN: 2.7 g/dL — AB (ref 3.5–5.0)
ALK PHOS: 67 U/L (ref 38–126)
ALT: 22 U/L (ref 17–63)
ANION GAP: 4 — AB (ref 5–15)
AST: 46 U/L — AB (ref 15–41)
BUN: 6 mg/dL (ref 6–20)
CO2: 28 mmol/L (ref 22–32)
Calcium: 8.1 mg/dL — ABNORMAL LOW (ref 8.9–10.3)
Chloride: 112 mmol/L — ABNORMAL HIGH (ref 101–111)
Creatinine, Ser: 0.98 mg/dL (ref 0.61–1.24)
GFR calc Af Amer: >60 mL/min (ref >60)
GFR calc non Af Amer: >60 mL/min (ref >60)
GLUCOSE: 100 mg/dL — AB (ref 65–99)
POTASSIUM: 4.2 mmol/L (ref 3.5–5.1)
SODIUM: 144 mmol/L (ref 135–145)
Total Bilirubin: 0.9 mg/dL (ref 0.3–1.2)
Total Protein: 6 g/dL — ABNORMAL LOW (ref 6.5–8.1)

## 2017-02-06 LAB — CBC
HCT: 44.2 % (ref 39.0–52.0)
Hemoglobin: 13.2 g/dL (ref 13.0–17.0)
MCH: 26.1 pg (ref 26.0–34.0)
MCHC: 29.9 g/dL — ABNORMAL LOW (ref 30.0–36.0)
MCV: 87.4 fL (ref 78.0–100.0)
Platelets: 246 10*3/uL (ref 150–400)
RBC: 5.06 MIL/uL (ref 4.22–5.81)
RDW: 19 % — ABNORMAL HIGH (ref 11.5–15.5)
WBC: 7.4 10*3/uL (ref 4.0–10.5)

## 2017-02-06 MED ORDER — INFLUENZA VAC SPLIT HIGH-DOSE 0.5 ML IM SUSY
0.5000 mL | PREFILLED_SYRINGE | INTRAMUSCULAR | Status: DC
  Filled 2017-02-06: qty 0.5

## 2017-02-06 MED ORDER — DEXTROSE 5 % IV SOLN
500.0000 mg | INTRAVENOUS | Status: DC
  Administered 2017-02-06 – 2017-02-10 (×5): 500 mg via INTRAVENOUS
  Filled 2017-02-06 (×5): qty 500

## 2017-02-06 MED ORDER — NICOTINE 14 MG/24HR TD PT24
14.0000 mg | MEDICATED_PATCH | Freq: Every day | TRANSDERMAL | Status: DC
  Administered 2017-02-09: 14 mg via TRANSDERMAL
  Filled 2017-02-06 (×3): qty 1

## 2017-02-06 NOTE — Progress Notes (Signed)
Gave pt some broth and he immediately started to cough. Made sure pt was sitting straight upright and awake. Gave pt a rest and tried a sip of sprite. Pt continued to cough. Notified MD and requested NPO and speech consult

## 2017-02-06 NOTE — Progress Notes (Signed)
MD was notified that patient went into AFIB and Amiodarone gtt started because patient lethargic and on bipap.

## 2017-02-06 NOTE — Progress Notes (Signed)
SLP Cancellation Note  Patient Details Name: Patrick Holt MRN: [id number] DOB: January 04, 1951   Cancelled treatment:       Reason Eval/Treat Not Completed: Medical issues which prohibited therapy. Spoke with RN, pt on BiPAP. Will f/u next date.  Deneise Lever, [place], CCC-SLP Speech-Language Pathologist [phone]   Aliene Altes 02/06/2017, 3:13 PM

## 2017-02-06 NOTE — Progress Notes (Signed)
PROGRESS NOTE  [name] DDU:[id number] DOB: Jul 01, 1950 DOA: 02/05/2017 PCP: Medicine, [facility]  HPI/Recap of past 24 hours: HPI from [name], NP on 02/05/17 Patrick Holt is a 66 y.o. male with medical history significant for bipolar disorder noncompliant with medications, PAF on amiodarone, COPD, nonhealing right hand fracture since 2017 in the context of recurrent falls, dermatomyositis, prediabetes, anemia, history of narcotic dependence, hypertension, and seizure disorder on AEDs. EMS has been called to the patient's house multiple times in the past 3 days (12/20, 12/21, and today) for reports of falls. On arrival to the ER he was complaining of back pain.  EMS also documented patient with hypoxemia on room air with saturations in the mid 80s therefore nasal cannula oxygen was applied at 6 L/min.  ABG revealed mild respiratory acidosis with a pH is 7.33 and a P CO2 of 53.2, BiPAP was applied. CK was also elevated at 2175. Pt will be admitted primarily due to his acute hypoxemic and hypercarbic respiratory failure in the context of COPD exacerbation.  Today, pt reported feeling better, resolved AMS, denies any chest pain, worsening shortness of breath, abdominal pain, fever/chills, nausea/vomiting/diarrhea/constipation.  Patient reported being hungry as he has not had anything to eat in the past few days.   Assessment/Plan: Principal Problem:   Acute respiratory failure with hypoxia and hypercarbia (HCC) Active Problems:   Rhabdomyolysis   COPD with acute exacerbation (HCC)   Bipolar 1 disorder (HCC)   Anemia   Hypertension   Hand fracture, right   Narcotic dependence (HCC)   Pre-diabetes   PAF (paroxysmal atrial fibrillation) (HCC)   Frequent falls   History of dermatomyositis   Seizure disorder (HCC)   Bilateral lower extremity edema  #Acute respiratory failure with hypoxia and hypercarbia/ COPD with acute exacerbation/severe pulmonary  hypertension Improving -Presented to ER after frequent falls and was found to be hypoxemic and hypercarbic -Currently on 6L of O2, no longer requiring BiPAP -Viral panel negative, CXR showed no focal infiltrate -Start IV Azithromycin for COPD exacerbation -DuoNeb's, IV Solu-Medrol 60 mg every 12 hours, plan to taper -Continue Symbicort -Continuous pulse oximetry -Monitor closely, Bipap PRN  #Rhabdomyolysis/Frequent falls Appears chronic -CK 2175 with normal renal function -Alcohol level <10 -Gentle IV fluid hydration -Patient offered SNF placement at least for rehab during previous admission but given his underlying psych condition and paranoia refused placement and insisted on returning home -Hold statin for now given low-grade concomitant elevation in AST -PT/OT  #Moderate aortic stenosis/history of reported syncope/PulmHTN -Pulm HTN 75 mmHg -Vmax 1.48 cm -Potentially contributing to recurrent falls in the context of dehydration and medication    Noncompliance -?? Sleep apnea, sleep study as an outpt  #Bipolar 1 disorder Stable -Documented noncompliance with medications -Current Depakote level therapeutic -Continue home Xanax and Depakote  #Hypertension Soft BP Held Norvasc IV hydralazine prn  #Narcotic dependence  -Nicotine patch  #PAF (paroxysmal atrial fibrillation)  -Went into Afib RVR -Started on Amiodarone ggt, held home PO amiodarone -Due to frequent falls and noncompliance, not a candidate for chronic anticoagulation  #History of dermatomyositis -Does not follow-up with rheumatology services secondary to being fired from multiple practices in the context of frequent no-shows -Currently on no thepary, PT/OT as above  #Bilateral lower extremity edema -Appears to have associated stasis dermatitis -Echocardiogram on 12/2016: Hyperdynamic ejection fraction 65-70% with mild focal basal hypertrophy of the septum, grade 1 diastolic dysfunction, moderate  aortic stenosis, and severe pulmonary hypertension  #Hand fracture,  right -Appears to be an ongoing problem since 2017 -X-rays today show no acute fracture but ligamentous injury -He has been instructed to follow-up with his orthopedic physician Dr. Burney Gauze on multiple occasions but still hasn't seen, non-compliant with splint  #Pre-diabetes -Hemoglobin A1c was 5.9 Outpt monitor  #Seizure disorder  -Previous admission unclear if patient had true syncope versus unwitnessed seizure activity -Continue IV Keppra  #History of CVA -On aspirin and statin prior to admission, held statin due to rhabdo       Code Status: Full  Family Communication: None at bedside  Disposition Plan: Likely SNF, pending PT/OT evaluation   Consultants:  None  Procedures:  None  Antimicrobials:  IV Azithromycin   DVT prophylaxis:  Lovenox   Objective: Vitals:   02/06/17 0505 02/06/17 0711 02/06/17 0817 02/06/17 1110  BP:  95/63  98/71  Pulse: 75 63  65  Resp: 13 15  20   Temp:  98.5 F (36.9 C)  98 F (36.7 C)  TempSrc:  Oral  Oral  SpO2: 93% 94% (!) 89% 91%  Weight:      Height:        Intake/Output Summary (Last 24 hours) at 02/06/2017 1202 Last data filed at 02/06/2017 2831 Gross per 24 hour  Intake 3060.41 ml  Output 550 ml  Net 2510.41 ml   Filed Weights   02/05/17 0921 02/05/17 1327 02/06/17 0346  Weight: 110.7 kg (244 lb) 109.1 kg (240 lb 8.4 oz) 100.7 kg (222 lb)    Exam:   General: Currently alert, awake, oriented, not in acute distress  Cardiovascular: S1, S2 present, irregularly irregular, no added heart sounds  Respiratory: Chest clear bilaterally  Abdomen: Soft, nontender, nondistended, bowel sounds present  Musculoskeletal: Bilateral 1+ lower extremity edema  Skin: Normal  Psychiatry: Normal mood   Data Reviewed: CBC: Recent Labs  Lab 02/05/17 0919 02/06/17 0238  WBC 7.8 7.4  NEUTROABS 4.6  --   HGB 13.9 13.2  HCT 44.4 44.2  MCV  85.2 87.4  PLT 243 517   Basic Metabolic Panel: Recent Labs  Lab 02/05/17 0919 02/06/17 0238  NA 144 144  K 3.5 4.2  CL 109 112*  CO2 26 28  GLUCOSE 85 100*  BUN 9 6  CREATININE 1.06 0.98  CALCIUM 8.2* 8.1*   GFR: Estimated Creatinine Clearance: 82.7 mL/min (by C-G formula based on SCr of 0.98 mg/dL). Liver Function Tests: Recent Labs  Lab 02/05/17 0919 02/06/17 0238  AST 55* 46*  ALT 22 22  ALKPHOS 72 67  BILITOT 0.7 0.9  PROT 6.6 6.0*  ALBUMIN 3.2* 2.7*   No results for input(s): LIPASE, AMYLASE in the last 168 hours. Recent Labs  Lab 02/05/17 0919  AMMONIA 35   Coagulation Profile: Recent Labs  Lab 02/05/17 0919  INR 1.25   Cardiac Enzymes: Recent Labs  Lab 02/05/17 0919  CKTOTAL 2,175*   BNP (last 3 results) No results for input(s): PROBNP in the last 8760 hours. HbA1C: No results for input(s): HGBA1C in the last 72 hours. CBG: Recent Labs  Lab 02/05/17 0925  GLUCAP 98   Lipid Profile: No results for input(s): CHOL, HDL, LDLCALC, TRIG, CHOLHDL, LDLDIRECT in the last 72 hours. Thyroid Function Tests: No results for input(s): TSH, T4TOTAL, FREET4, T3FREE, THYROIDAB in the last 72 hours. Anemia Panel: No results for input(s): VITAMINB12, FOLATE, FERRITIN, TIBC, IRON, RETICCTPCT in the last 72 hours. Urine analysis:    Component Value Date/Time   COLORURINE AMBER (A) 02/05/2017 6160  APPEARANCEUR CLEAR 02/05/2017 0920   LABSPEC 1.024 02/05/2017 0920   PHURINE 5.0 02/05/2017 0920   GLUCOSEU NEGATIVE 02/05/2017 0920   HGBUR NEGATIVE 02/05/2017 0920   HGBUR negative 12/11/2009 1356   BILIRUBINUR NEGATIVE 02/05/2017 0920   BILIRUBINUR MODERATE 06/05/2015 1407   KETONESUR 5 (A) 02/05/2017 0920   PROTEINUR NEGATIVE 02/05/2017 0920   UROBILINOGEN 2.0 06/05/2015 1407   UROBILINOGEN 1.0 12/30/2014 1830   NITRITE NEGATIVE 02/05/2017 0920   LEUKOCYTESUR NEGATIVE 02/05/2017 0920   Sepsis  Labs: @LABRCNTIP (procalcitonin:4,lacticidven:4)  ) Recent Results (from the past 240 hour(s))  Respiratory Panel by PCR     Status: None   Collection Time: 02/05/17  1:00 PM  Result Value Ref Range Status   Adenovirus NOT DETECTED NOT DETECTED Final   Coronavirus 229E NOT DETECTED NOT DETECTED Final   Coronavirus HKU1 NOT DETECTED NOT DETECTED Final   Coronavirus NL63 NOT DETECTED NOT DETECTED Final   Coronavirus OC43 NOT DETECTED NOT DETECTED Final   Metapneumovirus NOT DETECTED NOT DETECTED Final   Rhinovirus / Enterovirus NOT DETECTED NOT DETECTED Final   Influenza A NOT DETECTED NOT DETECTED Final   Influenza B NOT DETECTED NOT DETECTED Final   Parainfluenza Virus 1 NOT DETECTED NOT DETECTED Final   Parainfluenza Virus 2 NOT DETECTED NOT DETECTED Final   Parainfluenza Virus 3 NOT DETECTED NOT DETECTED Final   Parainfluenza Virus 4 NOT DETECTED NOT DETECTED Final   Respiratory Syncytial Virus NOT DETECTED NOT DETECTED Final   Bordetella pertussis NOT DETECTED NOT DETECTED Final   Chlamydophila pneumoniae NOT DETECTED NOT DETECTED Final   Mycoplasma pneumoniae NOT DETECTED NOT DETECTED Final  MRSA PCR Screening     Status: None   Collection Time: 02/05/17  1:00 PM  Result Value Ref Range Status   MRSA by PCR NEGATIVE NEGATIVE Final    Comment:        The GeneXpert MRSA Assay (FDA approved for NASAL specimens only), is one component of a comprehensive MRSA colonization surveillance program. It is not intended to diagnose MRSA infection nor to guide or monitor treatment for MRSA infections.       Studies: No results found.  Scheduled Meds: . enoxaparin (LOVENOX) injection  40 mg Subcutaneous Q24H  . [START ON 02/07/2017] Influenza vac split quadrivalent PF  0.5 mL Intramuscular Tomorrow-1000  . ipratropium-albuterol  3 mL Nebulization Q6H  . methylPREDNISolone (SOLU-MEDROL) injection  60 mg Intravenous Q12H  . mometasone-formoterol  2 puff Inhalation BID  . sodium  chloride flush  3 mL Intravenous Q12H    Continuous Infusions: . sodium chloride 125 mL/hr at 02/06/17 0937  . amiodarone 30 mg/hr (02/06/17 0445)  . levETIRAcetam Stopped (02/06/17 0736)  . valproate sodium Stopped (02/06/17 1041)     LOS: 1 day     Alma Friendly, MD [facility]  If 7PM-7AM, please contact night-coverage [email or web address] Password [facility] 02/06/2017, 12:02 PM

## 2017-02-07 ENCOUNTER — Other Ambulatory Visit: Payer: Self-pay

## 2017-02-07 LAB — CBC WITH DIFFERENTIAL/PLATELET
BASOS ABS: 0 10*3/uL (ref 0.0–0.1)
BASOS PCT: 0 %
Eosinophils Absolute: 0 10*3/uL (ref 0.0–0.7)
Eosinophils Relative: 0 %
HEMATOCRIT: 41.7 % (ref 39.0–52.0)
Hemoglobin: 13.2 g/dL (ref 13.0–17.0)
Lymphocytes Relative: 7 %
Lymphs Abs: 0.7 10*3/uL (ref 0.7–4.0)
MCH: 27.2 pg (ref 26.0–34.0)
MCHC: 31.7 g/dL (ref 30.0–36.0)
MCV: 85.8 fL (ref 78.0–100.0)
MONO ABS: 0.3 10*3/uL (ref 0.1–1.0)
Monocytes Relative: 3 %
NEUTROS ABS: 9.1 10*3/uL — AB (ref 1.7–7.7)
NEUTROS PCT: 90 %
PLATELETS: 223 10*3/uL (ref 150–400)
RBC: 4.86 MIL/uL (ref 4.22–5.81)
RDW: 18.9 % — ABNORMAL HIGH (ref 11.5–15.5)
WBC: 10.2 10*3/uL (ref 4.0–10.5)

## 2017-02-07 LAB — BASIC METABOLIC PANEL
ANION GAP: 6 (ref 5–15)
BUN: 15 mg/dL (ref 6–20)
CALCIUM: 8.3 mg/dL — AB (ref 8.9–10.3)
CO2: 24 mmol/L (ref 22–32)
Chloride: 111 mmol/L (ref 101–111)
Creatinine, Ser: 0.91 mg/dL (ref 0.61–1.24)
GLUCOSE: 158 mg/dL — AB (ref 65–99)
Potassium: 5.5 mmol/L — ABNORMAL HIGH (ref 3.5–5.1)
SODIUM: 141 mmol/L (ref 135–145)

## 2017-02-07 LAB — POTASSIUM: Potassium: 4.5 mmol/L (ref 3.5–5.1)

## 2017-02-07 LAB — LEVETIRACETAM LEVEL: Levetiracetam Lvl: 25.9 ug/mL (ref 10.0–40.0)

## 2017-02-07 MED ORDER — IPRATROPIUM-ALBUTEROL 0.5-2.5 (3) MG/3ML IN SOLN
3.0000 mL | Freq: Three times a day (TID) | RESPIRATORY_TRACT | Status: DC
  Administered 2017-02-07 – 2017-02-08 (×5): 3 mL via RESPIRATORY_TRACT
  Filled 2017-02-07 (×5): qty 3

## 2017-02-07 NOTE — Progress Notes (Signed)
PROGRESS NOTE  Patrick Holt WLN:[id number] DOB: 04-Aug-1950 DOA: 02/05/2017 PCP: Medicine, [facility]  HPI/Recap of past 24 hours: HPI from [name], NP on 02/05/17 Patrick Holt is a 66 y.o. male with medical history significant for bipolar disorder noncompliant with medications, PAF on amiodarone, COPD, nonhealing right hand fracture since 2017 in the context of recurrent falls, dermatomyositis, prediabetes, anemia, history of narcotic dependence, hypertension, and seizure disorder on AEDs. EMS has been called to the patient's house multiple times in the past 3 days (12/20, 12/21, and today) for reports of falls. On arrival to the ER he was complaining of back pain.  EMS also documented patient with hypoxemia on room air with saturations in the mid 80s therefore nasal cannula oxygen was applied at 6 L/min.  ABG revealed mild respiratory acidosis with a pH is 7.33 and a P CO2 of 53.2, BiPAP was applied. CK was also elevated at 2175. Pt will be admitted primarily due to his acute hypoxemic and hypercarbic respiratory failure in the context of COPD exacerbation.  Today, pt reported feeling better, resolved AMS, denies any chest pain, worsening shortness of breath, abdominal pain, fever/chills, nausea/vomiting/diarrhea/constipation. PT recommended SNF placement, pt reported wanting to go home and pay his bills. Diet advanced   Assessment/Plan: Principal Problem:   Acute respiratory failure with hypoxia and hypercarbia (HCC) Active Problems:   Rhabdomyolysis   COPD with acute exacerbation (HCC)   Bipolar 1 disorder (HCC)   Anemia   Hypertension   Hand fracture, right   Narcotic dependence (HCC)   Pre-diabetes   PAF (paroxysmal atrial fibrillation) (HCC)   Frequent falls   History of dermatomyositis   Seizure disorder (HCC)   Bilateral lower extremity edema  #Acute respiratory failure with hypoxia and hypercarbia/ COPD with acute exacerbation/severe pulmonary  hypertension Improving -Presented to ER after frequent falls and was found to be hypoxemic and hypercarbic -Currently on 6L of O2, no longer requiring BiPAP -Viral panel negative, CXR showed no focal infiltrate -Start IV Azithromycin for COPD exacerbation -DuoNeb's, IV Solu-Medrol 60 mg every 12 hours, plan to taper -Continue Symbicort -Continuous pulse oximetry -Monitor closely, Bipap PRN  #Rhabdomyolysis/Frequent falls Appears chronic -CK 2175 with normal renal function -Alcohol level <10 -Gentle IV fluid hydration -Patient offered SNF placement at least for rehab during previous admission but given his underlying psych condition and paranoia refused placement and insisted on returning home -This admission, pt still refusing SNF placement -Hold statin for now given low-grade concomitant elevation in AST -PT/OT on board  #Moderate aortic stenosis/history of reported syncope/PulmHTN -Pulm HTN 75 mmHg -Vmax 1.48 cm -Potentially contributing to recurrent falls in the context of dehydration and medication    Noncompliance -?? Sleep apnea, sleep study as an outpt  #Bipolar 1 disorder Stable -Documented noncompliance with medications -Current Depakote level therapeutic -Continue home Xanax and Depakote  #Hypertension Soft BP Held Norvasc IV hydralazine prn  #Narcotic dependence  -Nicotine patch  #PAF (paroxysmal atrial fibrillation)  -Went into Afib RVR -Started on Amiodarone ggt, held home PO amiodarone -Due to frequent falls and noncompliance, not a candidate for chronic anticoagulation  #History of dermatomyositis -Does not follow-up with rheumatology services secondary to being fired from multiple practices in the context of frequent no-shows -Currently on no thepary, PT/OT as above  #Bilateral lower extremity edema -Appears to have associated stasis dermatitis -Echocardiogram on 12/2016: Hyperdynamic ejection fraction 65-70% with mild focal basal  hypertrophy of the septum, grade 1 diastolic dysfunction, moderate aortic stenosis,  and severe pulmonary hypertension  #Hand fracture, right -Appears to be an ongoing problem since 2017 -X-rays today show no acute fracture but ligamentous injury -He has been instructed to follow-up with his orthopedic physician Dr. Burney Gauze on multiple occasions but still hasn't seen, non-compliant with splint  #Pre-diabetes -Hemoglobin A1c was 5.9 Outpt monitor  #Seizure disorder  -Previous admission unclear if patient had true syncope versus unwitnessed seizure activity -Continue IV Keppra  #History of CVA -On aspirin and statin prior to admission, held statin due to rhabdo       Code Status: Full  Family Communication: None at bedside  Disposition Plan: SNF placement, pt refusing  Consultants:  None  Procedures:  None  Antimicrobials:  IV Azithromycin   DVT prophylaxis:  Lovenox   Objective: Vitals:   02/07/17 0317 02/07/17 0713 02/07/17 1240 02/07/17 1432  BP: 102/61 113/73 119/80   Pulse: 63 63    Resp: 10 10    Temp: 97.6 F (36.4 C) (!) 97.4 F (36.3 C) 97.7 F (36.5 C)   TempSrc: Oral Oral Oral   SpO2: 94% 92%  94%  Weight: 108.9 kg (240 lb)     Height:        Intake/Output Summary (Last 24 hours) at 02/07/2017 1540 Last data filed at 02/07/2017 1510 Gross per 24 hour  Intake 3573.06 ml  Output 550 ml  Net 3023.06 ml   Filed Weights   02/05/17 1327 02/06/17 0346 02/07/17 0317  Weight: 109.1 kg (240 lb 8.4 oz) 100.7 kg (222 lb) 108.9 kg (240 lb)    Exam:   General: Currently alert, awake, oriented, not in acute distress  Cardiovascular: S1, S2 present, irregularly irregular, no added heart sounds  Respiratory: Chest with decreased BS bilaterally  Abdomen: Soft, nontender, nondistended, bowel sounds present  Musculoskeletal: Bilateral 1+ lower extremity edema  Skin: Normal  Psychiatry: Normal mood   Data Reviewed: CBC: Recent Labs    Lab 02/05/17 0919 02/06/17 0238 02/07/17 0246  WBC 7.8 7.4 10.2  NEUTROABS 4.6  --  9.1*  HGB 13.9 13.2 13.2  HCT 44.4 44.2 41.7  MCV 85.2 87.4 85.8  PLT 243 246 701   Basic Metabolic Panel: Recent Labs  Lab 02/05/17 0919 02/06/17 0238 02/07/17 0246 02/07/17 0748  NA 144 144 141  --   K 3.5 4.2 5.5* 4.5  CL 109 112* 111  --   CO2 26 28 24   --   GLUCOSE 85 100* 158*  --   BUN 9 6 15   --   CREATININE 1.06 0.98 0.91  --   CALCIUM 8.2* 8.1* 8.3*  --    GFR: Estimated Creatinine Clearance: 92.7 mL/min (by C-G formula based on SCr of 0.91 mg/dL). Liver Function Tests: Recent Labs  Lab 02/05/17 0919 02/06/17 0238  AST 55* 46*  ALT 22 22  ALKPHOS 72 67  BILITOT 0.7 0.9  PROT 6.6 6.0*  ALBUMIN 3.2* 2.7*   No results for input(s): LIPASE, AMYLASE in the last 168 hours. Recent Labs  Lab 02/05/17 0919  AMMONIA 35   Coagulation Profile: Recent Labs  Lab 02/05/17 0919  INR 1.25   Cardiac Enzymes: Recent Labs  Lab 02/05/17 0919  CKTOTAL 2,175*   BNP (last 3 results) No results for input(s): PROBNP in the last 8760 hours. HbA1C: No results for input(s): HGBA1C in the last 72 hours. CBG: Recent Labs  Lab 02/05/17 0925  GLUCAP 98   Lipid Profile: No results for input(s): CHOL, HDL, LDLCALC, TRIG, CHOLHDL,  LDLDIRECT in the last 72 hours. Thyroid Function Tests: No results for input(s): TSH, T4TOTAL, FREET4, T3FREE, THYROIDAB in the last 72 hours. Anemia Panel: No results for input(s): VITAMINB12, FOLATE, FERRITIN, TIBC, IRON, RETICCTPCT in the last 72 hours. Urine analysis:    Component Value Date/Time   COLORURINE AMBER (A) 02/05/2017 0920   APPEARANCEUR CLEAR 02/05/2017 0920   LABSPEC 1.024 02/05/2017 0920   PHURINE 5.0 02/05/2017 0920   GLUCOSEU NEGATIVE 02/05/2017 0920   HGBUR NEGATIVE 02/05/2017 0920   HGBUR negative 12/11/2009 1356   BILIRUBINUR NEGATIVE 02/05/2017 0920   BILIRUBINUR MODERATE 06/05/2015 1407   KETONESUR 5 (A) 02/05/2017 0920    PROTEINUR NEGATIVE 02/05/2017 0920   UROBILINOGEN 2.0 06/05/2015 1407   UROBILINOGEN 1.0 12/30/2014 1830   NITRITE NEGATIVE 02/05/2017 0920   LEUKOCYTESUR NEGATIVE 02/05/2017 0920   Sepsis Labs: @LABRCNTIP (procalcitonin:4,lacticidven:4)  ) Recent Results (from the past 240 hour(s))  Respiratory Panel by PCR     Status: None   Collection Time: 02/05/17  1:00 PM  Result Value Ref Range Status   Adenovirus NOT DETECTED NOT DETECTED Final   Coronavirus 229E NOT DETECTED NOT DETECTED Final   Coronavirus HKU1 NOT DETECTED NOT DETECTED Final   Coronavirus NL63 NOT DETECTED NOT DETECTED Final   Coronavirus OC43 NOT DETECTED NOT DETECTED Final   Metapneumovirus NOT DETECTED NOT DETECTED Final   Rhinovirus / Enterovirus NOT DETECTED NOT DETECTED Final   Influenza A NOT DETECTED NOT DETECTED Final   Influenza B NOT DETECTED NOT DETECTED Final   Parainfluenza Virus 1 NOT DETECTED NOT DETECTED Final   Parainfluenza Virus 2 NOT DETECTED NOT DETECTED Final   Parainfluenza Virus 3 NOT DETECTED NOT DETECTED Final   Parainfluenza Virus 4 NOT DETECTED NOT DETECTED Final   Respiratory Syncytial Virus NOT DETECTED NOT DETECTED Final   Bordetella pertussis NOT DETECTED NOT DETECTED Final   Chlamydophila pneumoniae NOT DETECTED NOT DETECTED Final   Mycoplasma pneumoniae NOT DETECTED NOT DETECTED Final  MRSA PCR Screening     Status: None   Collection Time: 02/05/17  1:00 PM  Result Value Ref Range Status   MRSA by PCR NEGATIVE NEGATIVE Final    Comment:        The GeneXpert MRSA Assay (FDA approved for NASAL specimens only), is one component of a comprehensive MRSA colonization surveillance program. It is not intended to diagnose MRSA infection nor to guide or monitor treatment for MRSA infections.       Studies: No results found.  Scheduled Meds: . enoxaparin (LOVENOX) injection  40 mg Subcutaneous Q24H  . Influenza vac split quadrivalent PF  0.5 mL Intramuscular Tomorrow-1000  .  ipratropium-albuterol  3 mL Nebulization TID  . methylPREDNISolone (SOLU-MEDROL) injection  60 mg Intravenous Q12H  . mometasone-formoterol  2 puff Inhalation BID  . nicotine  14 mg Transdermal Daily  . sodium chloride flush  3 mL Intravenous Q12H    Continuous Infusions: . sodium chloride 100 mL/hr at 02/06/17 1800  . amiodarone 30 mg/hr (02/06/17 1800)  . azithromycin Stopped (02/07/17 1312)  . levETIRAcetam 750 mg (02/07/17 0500)  . valproate sodium Stopped (02/07/17 1510)     LOS: 2 days     Alma Friendly, MD [facility]  If 7PM-7AM, please contact night-coverage [email or web address] Password [facility] 02/07/2017, 3:40 PM

## 2017-02-07 NOTE — Evaluation (Signed)
Occupational Therapy Evaluation Patient Details Name: Patrick Holt MRN: [id number] DOB: August 15, 1950 Today's Date: 02/07/2017    History of Present Illness Pt is a 66 y.o. male with PMH significant forbipolar disorder noncompliant with meds, PAF on amiodarone, COPD, nonhealing right hand fx since 2017 in the context of recurrent falls, dermatomyositis, prediabetes, anemia, h/o narcotic dependence, HTN, and seizure disorder on AEDs.EMS has been called to the pt's home multiple times in the past 3 days(12/20, 12/21, and 12/22)forreports of falls, and brought to ED on 12/22 for this and admitted primarily due to his acute hypoxemic and hypercarbic respiratory failure in the context of COPD exacerbation. Of note, recent admission 2 weeks ago after being found down with AMS.   Clinical Impression   PTA, pt reports having assistance from his neighbor for bathing tasks but otherwise able to complete ADL with modified independence utilizing cane or RW for home distances and electric scooter for community mobility. He presents to evaluation hyperfocused on need to return home to pay his rent and concern over neighbors stealing his possessions. Pt currently requires min assist for sit<>stand transfers prior to completing toileting hygiene, max assist for LB ADL, and min assist for UB ADL. He demonstrates decreased activity tolerance for ADL, decreased balance, and poor ability to attend to tasks. Pt would benefit from continued OT services while admitted to improve independence and safety with ADL. Feel that pt requires SNF level rehabilitation post-acute D/C to ensure safety post-acute D/C.     Follow Up Recommendations  SNF;Supervision/Assistance - 24 hour    Equipment Recommendations  Other (comment)(defer to next venue of care)    Recommendations for Other Services       Precautions / Restrictions Precautions Precautions: Fall Restrictions Weight Bearing Restrictions: No      Mobility  Bed Mobility Overal bed mobility: Needs Assistance Bed Mobility: Supine to Sit;Sit to Supine     Supine to sit: Min assist Sit to supine: Min guard   General bed mobility comments: Min assist to raise trunk from [facility] but able to return to bed with min guard assist.   Transfers Overall transfer level: Needs assistance Equipment used: Rolling walker (2 wheeled) Transfers: Sit to/from Stand Sit to Stand: From elevated surface;Min assist         General transfer comment: Min assist to rise from EOB.     Balance Overall balance assessment: Needs assistance Sitting-balance support: Feet supported;No upper extremity supported Sitting balance-Leahy Scale: Fair     Standing balance support: Bilateral upper extremity supported;Single extremity supported;During functional activity Standing balance-Leahy Scale: Poor Standing balance comment: Requires at least single UE support for safety.                            ADL either performed or assessed with clinical judgement   ADL Overall ADL's : Needs assistance/impaired Eating/Feeding: Minimal assistance;Bed level   Grooming: Set up;Sitting   Upper Body Bathing: Minimal assistance;Sitting   Lower Body Bathing: Moderate assistance;Sit to/from stand   Upper Body Dressing : Minimal assistance;Sitting   Lower Body Dressing: Maximal assistance;Sit to/from stand   Toilet Transfer: Minimal assistance Toilet Transfer Details (indicate cue type and reason): Able to complete sit<>stand with min assist in preparation for toilet transfers. Pt declined transfer to chair or BSC at this time.  Toileting- Clothing Manipulation and Hygiene: Minimal assistance;Sit to/from stand       Functional mobility during ADLs: Minimal assistance;Rolling walker General ADL Comments:  Assist for standing balance and constant re-direction.      Vision         Perception     Praxis      Pertinent Vitals/Pain Pain Assessment: No/denies  pain     Hand Dominance Right   Extremity/Trunk Assessment Upper Extremity Assessment Upper Extremity Assessment: Generalized weakness LUE Deficits / Details: recurrent L hand fracture; does not have brace with him   Lower Extremity Assessment Lower Extremity Assessment: Generalized weakness       Communication Communication Communication: Expressive difficulties   Cognition Arousal/Alertness: Awake/alert Behavior During Therapy: Anxious Overall Cognitive Status: No family/caregiver present to determine baseline cognitive functioning Area of Impairment: Attention;Memory;Following commands;Safety/judgement;Awareness;Problem solving                   Current Attention Level: Sustained Memory: Decreased short-term memory Following Commands: Follows one step commands with increased time Safety/Judgement: Decreased awareness of safety;Decreased awareness of deficits Awareness: Emergent Problem Solving: Slow processing;Difficulty sequencing General Comments: Pt very hyperfocused on financial status and need to go home to pay rent prior to going to SNF for rehab. Reports he does not trust the neighbor who assists him and that she has been stealing from this. Pt requires near constant re-direction.    General Comments  Difficult to redirect throughout session    Exercises     Shoulder Instructions      Home Living Family/patient expects to be discharged to:: Private residence Living Arrangements: Alone Available Help at Discharge: Family;Friend(s);Available PRN/intermittently Type of Home: Apartment Home Access: Elevator     Home Layout: One level     Bathroom Shower/Tub: Occupational psychologist: Standard     Home Equipment: Environmental consultant - 2 wheels;Cane - single point;Electric scooter          Prior Functioning/Environment Level of Independence: Needs assistance  Gait / Transfers Assistance Needed: Reports using either RW or cane for short-distance  mobility. If he is leaving his apartment he will use his electric scooter.  ADL's / Homemaking Assistance Needed: Reports he is independent with ADL other than bathing and he has assistance from a neighbor to bathe his back. He is afraid of falling per his report and avoids the shower.    Comments: Pt reports mod indep using RW to ambulate short distances; also has Transport planner. Neighbor runs errands for pt, ex-mother in law also available to help. Pt does not trust neighbors not to steal his belongings        OT Problem List: Decreased strength;Decreased activity tolerance;Impaired balance (sitting and/or standing);Decreased safety awareness;Decreased knowledge of use of DME or AE;Decreased knowledge of precautions      OT Treatment/Interventions: Self-care/ADL training;Therapeutic exercise;Energy conservation;DME and/or AE instruction;Therapeutic activities;Patient/family education;Balance training    OT Goals(Current goals can be found in the care plan section) Acute Rehab OT Goals Patient Stated Goal: To go home and pay rent before going to SNF OT Goal Formulation: With patient Time For Goal Achievement: 02/21/17 Potential to Achieve Goals: Good ADL Goals Pt Will Perform Grooming: standing;with supervision Pt Will Perform Lower Body Dressing: with supervision;sit to/from stand Pt Will Transfer to Toilet: with supervision;ambulating;bedside commode Pt Will Perform Toileting - Clothing Manipulation and hygiene: with supervision;sit to/from stand Additional ADL Goal #1: Pt will demonstrate selective attention throughout grooming tasks.  OT Frequency: Min 2X/week   Barriers to D/C:            Co-evaluation  AM-PAC PT "6 Clicks" Daily Activity     Outcome Measure Help from another person eating meals?: A Little Help from another person taking care of personal grooming?: A Little Help from another person toileting, which includes using toliet, bedpan, or  urinal?: A Little Help from another person bathing (including washing, rinsing, drying)?: A Lot Help from another person to put on and taking off regular upper body clothing?: A Little Help from another person to put on and taking off regular lower body clothing?: A Lot 6 Click Score: 16   End of Session Equipment Utilized During Treatment: Rolling walker Nurse Communication: Mobility status;Other (comment)(nurse tech - pt ready to have condom cath placed)  Activity Tolerance: Patient tolerated treatment well Patient left: in bed;with call bell/phone within reach  OT Visit Diagnosis: Other abnormalities of gait and mobility (R26.89);Muscle weakness (generalized) (M62.81);Other symptoms and signs involving cognitive function                Time: 4008-6761 OT Time Calculation (min): 28 min Charges:  OT General Charges $OT Visit: 1 Visit OT Evaluation $OT Eval Moderate Complexity: 1 Mod OT Treatments $Self Care/Home Management : 8-22 mins G-Codes:     Norman Herrlich, MS OTR/L  Pager: [place] [facility] [place] 02/07/2017, 10:47 AM

## 2017-02-07 NOTE — Evaluation (Signed)
Clinical/Bedside Swallow Evaluation Patient Details  Name: Patrick Holt MRN: [id number] Date of Birth: [birth year]  Today's Date: 02/07/2017 Time: SLP Start Time (ACUTE ONLY): 0815 SLP Stop Time (ACUTE ONLY): 0835 SLP Time Calculation (min) (ACUTE ONLY): 20 min  Past Medical History:  Past Medical History:  Diagnosis Date  . Anemia   . Anxiety   . Asthma   . Atrial fibrillation ([place]) 01/2012   with RVR  . Bipolar 1 disorder ([place])   . Cancer ([place])   . Collagen vascular disease ([place])   . Conversion disorder 06/2010  . COPD (chronic obstructive pulmonary disease) ([place])   . Dermatomyositis ([place])   . Dermatomyositis ([place])   . Diverticulitis   . Diverticulosis of colon   . Dysrhythmia    "irregular" (11/15/2012)  . Esophageal dysmotility   . Esophageal stricture   . Fibromyalgia   . Gastritis   . GERD (gastroesophageal reflux disease)   . Hand fracture, right    sept 13, 2017, still wearing splint as og 01-10-2017  . Headache(784.0)    "severe; get them often" (11/15/2012)  . Hiatal hernia   . History of narcotic addiction ([place])   . Hx of adenomatous colonic polyps   . Hyperlipidemia   . Hypertension   . Internal hemorrhoids   . Ischemic heart disease   . Major depression    with acute psychotic break in 06/2010  . Myocardial infarct ([place])    mulitple (1999, 2000, 2004)  . Narcotic dependence ([place])   . Nephrolithiasis   . Obesity   . OCD (obsessive compulsive disorder)   . Otosclerosis   . Paroxysmal A-fib ([place])   . Peripheral neuropathy   . Raynaud's disease   . Renal insufficiency   . Rheumatoid arthritis(714.0)   . Sarcoidosis   . Seizures ([place])   . Subarachnoid hemorrhage ([place]) 01/2012   with subdural  hematoma.   . Type II diabetes mellitus ([place])   . Urge incontinence   . Vertigo    Past Surgical History:  Past Surgical History:  Procedure Laterality Date  . BACK SURGERY    . CARDIAC CATHETERIZATION    . CARPAL TUNNEL RELEASE Bilateral   .  CATARACT EXTRACTION W/ INTRAOCULAR LENS IMPLANT Left   . COLONOSCOPY N/A 09/27/2012   Procedure: COLONOSCOPY;  Surgeon: Lafayette Dragon, MD;  Location: [facility] ENDOSCOPY;  Service: Endoscopy;  Laterality: N/A;  . ESOPHAGOGASTRODUODENOSCOPY N/A 09/27/2012   Procedure: ESOPHAGOGASTRODUODENOSCOPY (EGD);  Surgeon: Lafayette Dragon, MD;  Location: [facility];  Service: Endoscopy;  Laterality: N/A;  . KNEE ARTHROSCOPY W/ MENISCAL REPAIR Left 2009  . LUMBAR DISC SURGERY    . LYMPH NODE DISSECTION Right 1970's   "neck; dr thought I had Hodgkins; turned out to be sarcoidosis" (11/15/2012)  . squamous papilloma   2010   removed by Dr. Constance Holster ENT, noted on tongue  . TONSILLECTOMY     HPI:  Patrick Holt a 66 y.o.malewith medical history significant forbipolar disorder noncompliant with medications, PAF on amiodarone, COPD, GERD, hiatal hernia, esophageal dysmotility, nonhealing right hand fracture since 2017 in the context of recurrent falls, dermatomyositis, prediabetes, anemia, history of narcotic dependence, hypertension, and seizure disorder on AEDs.EMS has been called to the patient's house multiple times in the past 3 days(12/20, 12/21, and 12/22)forreports of falls. Pt admitted primarily due to acute hypoxemic and hypercarbic respiratory failure in the context of COPD exacerbation. CXR 02/05/17 showed mild bibasilar subsegmental atelectasis with mild right pleural. 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 Esophagram 2000, esophageal dilation.   Assessment / Plan / Recommendation Clinical Impression  Patient presents with oropharyngeal swallow which appears at bedside to be within functional limits with adequate airway protection. No overt signs of aspiration observed despite challenging with consecutive straw sips of thin liquids in excess of 3oz. Pt reports he coughed yesterday with broth because "I'm allergic to chicken broth. It makes me cough." He denies swallowing  difficulties after removal of his "throat tumor." Recommend dys 3 (soft) as pt is edentulous and prefers softer foods, with thin liquids, meds whole with liquid. Esophageal precautions due to pt's history of GERD, hiatal hernia, esophageal dysmotility. No further skilled ST needs identified. Will s/o.   SLP Visit Diagnosis: Dysphagia, unspecified (R13.10)    Aspiration Risk  Mild aspiration risk    Diet Recommendation Dysphagia 3 (Mech soft);Thin liquid   Liquid Administration via: Cup;Straw Medication Administration: Whole meds with liquid Supervision: Patient able to self feed Compensations: Minimize environmental distractions;Slow rate;Small sips/bites Postural Changes: Seated upright at 90 degrees;Remain upright for at least 30 minutes after po intake    Other  Recommendations Oral Care Recommendations: Oral care BID   Follow up Recommendations None      Frequency and Duration            Prognosis        Swallow Study   General Date of Onset: 02/05/17 HPI: Patrick Holt a 66 y.o.malewith medical history significant forbipolar disorder noncompliant with medications, PAF on amiodarone, COPD, GERD, hiatal hernia, esophageal dysmotility, nonhealing right hand fracture since 2017 in the context of recurrent falls, dermatomyositis, prediabetes, anemia, history of narcotic dependence, hypertension, and seizure disorder on AEDs.EMS has been called to the patient's house multiple times in the past 3 days(12/20, 12/21, and 12/22)forreports of falls. Pt admitted primarily due to acute hypoxemic and hypercarbic respiratory failure in the context of COPD exacerbation. CXR 02/05/17 showed mild bibasilar subsegmental atelectasis with mild right pleural. 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 Esophagram 2000, esophageal dilation. Type of Study: Bedside Swallow Evaluation Previous Swallow Assessment: Most recent bedside assessment 10/17/12 recommended dys  3/thin liquids Diet Prior to this Study: NPO Temperature Spikes Noted: No Respiratory Status: Nasal cannula History of Recent Intubation: No Behavior/Cognition: Alert;Cooperative Oral Cavity Assessment: Dry Oral Care Completed by SLP: Yes Oral Cavity - Dentition: Edentulous Vision: Functional for self-feeding Self-Feeding Abilities: Able to feed self Patient Positioning: Upright in bed Baseline Vocal Quality: Normal Volitional Cough: Strong Volitional Swallow: Able to elicit    Oral/Motor/Sensory Function Overall Oral Motor/Sensory Function: Within functional limits   Ice Chips Ice chips: Within functional limits Presentation: Spoon   Thin Liquid Thin Liquid: Within functional limits Presentation: Cup;Self Fed;Straw    Nectar Thick Nectar Thick Liquid: Not tested   Honey Thick Honey Thick Liquid: Not tested   Puree Puree: Within functional limits Presentation: Self Fed;Spoon   Solid   GO   Solid: Impaired Presentation: Self Fed Oral Phase Impairments: Impaired mastication Oral Phase Functional Implications: Impaired mastication       Deneise Lever, MS, CCC-SLP Speech-Language Pathologist [phone]  Aliene Altes 02/07/2017,8:49 AM

## 2017-02-07 NOTE — Evaluation (Addendum)
Physical Therapy Evaluation Patient Details Name: Patrick Holt MRN: [id number] DOB: June 01, 1950 Today's Date: 02/07/2017   History of Present Illness  Pt is a 66 y.o. male with PMH significant forbipolar disorder noncompliant with meds, PAF on amiodarone, COPD, nonhealing right hand fx since 2017 in the context of recurrent falls, dermatomyositis, prediabetes, anemia, h/o narcotic dependence, HTN, and seizure disorder on AEDs.EMS has been called to the pt's house multiple times in the past 3 days(12/20, 12/21, and 12/22)forreports of falls, and brought to ED on 12/22 for this and admitted primarily due to his acute hypoxemic and hypercarbic respiratory failure in the context of COPD exacerbation. Of note, recent admission 2 weeks ago after being found down with AMS.    Clinical Impression  Pt presents with an overall decrease in functional mobility secondary to above. PTA, pt lives alone and uses RW/SPC; requires assist from neighbors/friends to run errands as pt does not drive. Today, required minA for bed mobility and amb with min guard. Pt demonstrates decreased safety awareness and has h/o multiple falls; do not feel he is safe to return home with current functional status. States he is willing to continue therapies at SNF, but must return home first to pay his rent. Pt would benefit from continued acute PT services to maximize functional mobility and independence prior to d/c to SNF.   SpO2 down to 85% on RA, returning to >90% on 2L O2 [place]    Follow Up Recommendations SNF;Supervision/Assistance - 24 hour    Equipment Recommendations  None recommended by PT    Recommendations for Other Services       Precautions / Restrictions Precautions Precautions: Fall Restrictions Weight Bearing Restrictions: No      Mobility  Bed Mobility Overal bed mobility: Needs Assistance Bed Mobility: Supine to Sit;Sit to Supine     Supine to sit: Min assist Sit to supine: Min assist    General bed mobility comments: Significant increased time and effort getting in/out of bed, requiring minA to assist hips to EOB. Pt laying back down multiple times to "rest"  Transfers Overall transfer level: Needs assistance Equipment used: Rolling walker (2 wheeled) Transfers: Sit to/from Stand Sit to Stand: Min guard;From elevated surface         General transfer comment: Min guard for balance and safety with lines. Pt distracted by lines throughout mobility  Ambulation/Gait Ambulation/Gait assistance: Min guard Ambulation Distance (Feet): 50 Feet Assistive device: Rolling walker (2 wheeled) Gait Pattern/deviations: Step-through pattern Gait velocity: Decreased Gait velocity interpretation: <1.8 ft/sec, indicative of risk for recurrent falls General Gait Details: Min guard for balance. Pt reports "this is about how far I walk at home so I better turn back". Decreased safety awareness with amb  Stairs            Wheelchair Mobility    Modified Rankin (Stroke Patients Only)       Balance Overall balance assessment: Needs assistance Sitting-balance support: Feet supported;No upper extremity supported Sitting balance-Leahy Scale: Fair       Standing balance-Leahy Scale: Poor Standing balance comment: Reliant on UE support                             Pertinent Vitals/Pain Pain Assessment: No/denies pain    Home Living Family/patient expects to be discharged to:: Private residence Living Arrangements: Alone Available Help at Discharge: Family;Friend(s);Available PRN/intermittently Type of Home: Apartment Home Access: Elevator     Home Layout:  One level Home Equipment: [place] - 2 wheels;Cane - single point;Electric scooter      Prior Function           Comments: Pt reports mod indep using RW to ambulate short distances; also has Transport planner. Neighbor runs errands for pt, ex-mother in law also available to help. Pt does not trust  neighbors not to steal his belongings     Hand Dominance        Extremity/Trunk Assessment   Upper Extremity Assessment Upper Extremity Assessment: Generalized weakness;LUE deficits/detail LUE Deficits / Details: recurrent L hand fx    Lower Extremity Assessment Lower Extremity Assessment: Generalized weakness       Communication   Communication: Expressive difficulties  Cognition Arousal/Alertness: Awake/alert Behavior During Therapy: Anxious Overall Cognitive Status: No family/caregiver present to determine baseline cognitive functioning Area of Impairment: Attention;Memory;Following commands;Safety/judgement;Awareness;Problem solving                   Current Attention Level: Sustained Memory: Decreased short-term memory Following Commands: Follows one step commands with increased time Safety/Judgement: Decreased awareness of safety;Decreased awareness of deficits Awareness: Emergent Problem Solving: Slow processing;Difficulty sequencing General Comments: Pt perseverating on needing to go home to pay his rent before he can go to SNF for rehab. Very talkative throughout session and distracted by this, requiring repeated cues to attend to task      General Comments General comments (skin integrity, edema, etc.): Difficult to redirect throughout session    Exercises     Assessment/Plan    PT Assessment Patient needs continued PT services  PT Problem List Decreased strength;Decreased balance;Decreased mobility;Decreased knowledge of use of DME;Decreased safety awareness;Decreased cognition;Decreased knowledge of precautions;Pain;Decreased activity tolerance       PT Treatment Interventions DME instruction;Gait training;Functional mobility training;Therapeutic exercise;Balance training;Therapeutic activities;Neuromuscular re-education;Cognitive remediation;Patient/family education    PT Goals (Current goals can be found in the Care Plan section)  Acute Rehab  PT Goals Patient Stated Goal: To go home and pay rent before going to SNF PT Goal Formulation: With patient Time For Goal Achievement: 02/21/17 Potential to Achieve Goals: Good    Frequency Min 2X/week   Barriers to discharge Decreased caregiver support      Co-evaluation               AM-PAC PT "6 Clicks" Daily Activity  Outcome Measure Difficulty turning over in bed (including adjusting bedclothes, sheets and blankets)?: Unable Difficulty moving from lying on back to sitting on the side of the bed? : Unable Difficulty sitting down on and standing up from a chair with arms (e.g., wheelchair, bedside commode, etc,.)?: A Little Help needed moving to and from a bed to chair (including a wheelchair)?: A Little Help needed walking in hospital room?: A Little Help needed climbing 3-5 steps with a railing? : A Lot 6 Click Score: 13    End of Session Equipment Utilized During Treatment: Gait belt;Oxygen Activity Tolerance: Patient tolerated treatment well Patient left: in bed;with call bell/phone within reach Nurse Communication: Mobility status PT Visit Diagnosis: Other abnormalities of gait and mobility (R26.89);Muscle weakness (generalized) (M62.81);Unsteadiness on feet (R26.81);Repeated falls (R29.6);History of falling (Z91.81)    Time: 7425-9563 PT Time Calculation (min) (ACUTE ONLY): 32 min   Charges:   PT Evaluation $PT Eval Moderate Complexity: 1 Mod PT Treatments $Gait Training: 8-22 mins   PT G Codes:       Mabeline Caras, PT, DPT Acute Rehab Services  Pager: [place] 02/07/2017, 9:32  AM

## 2017-02-08 ENCOUNTER — Inpatient Hospital Stay (HOSPITAL_COMMUNITY): Payer: Medicare Other

## 2017-02-08 ENCOUNTER — Encounter (HOSPITAL_COMMUNITY): Payer: Self-pay

## 2017-02-08 ENCOUNTER — Other Ambulatory Visit: Payer: Self-pay

## 2017-02-08 LAB — BASIC METABOLIC PANEL
Anion gap: 6 (ref 5–15)
BUN: 17 mg/dL (ref 6–20)
CALCIUM: 8.3 mg/dL — AB (ref 8.9–10.3)
CHLORIDE: 111 mmol/L (ref 101–111)
CO2: 24 mmol/L (ref 22–32)
CREATININE: 0.94 mg/dL (ref 0.61–1.24)
Glucose, Bld: 184 mg/dL — ABNORMAL HIGH (ref 65–99)
Potassium: 3.9 mmol/L (ref 3.5–5.1)
SODIUM: 141 mmol/L (ref 135–145)

## 2017-02-08 LAB — CBC WITH DIFFERENTIAL/PLATELET
BASOS ABS: 0 10*3/uL (ref 0.0–0.1)
BASOS PCT: 0 %
EOS ABS: 0 10*3/uL (ref 0.0–0.7)
Eosinophils Relative: 0 %
HCT: 40.8 % (ref 39.0–52.0)
HEMOGLOBIN: 12.5 g/dL — AB (ref 13.0–17.0)
Lymphocytes Relative: 5 %
Lymphs Abs: 0.7 10*3/uL (ref 0.7–4.0)
MCH: 26.5 pg (ref 26.0–34.0)
MCHC: 30.6 g/dL (ref 30.0–36.0)
MCV: 86.6 fL (ref 78.0–100.0)
Monocytes Absolute: 0.6 10*3/uL (ref 0.1–1.0)
Monocytes Relative: 4 %
NEUTROS PCT: 91 %
Neutro Abs: 13.8 10*3/uL — ABNORMAL HIGH (ref 1.7–7.7)
Platelets: 252 10*3/uL (ref 150–400)
RBC: 4.71 MIL/uL (ref 4.22–5.81)
RDW: 19 % — ABNORMAL HIGH (ref 11.5–15.5)
WBC: 15.1 10*3/uL — AB (ref 4.0–10.5)

## 2017-02-08 MED ORDER — ASPIRIN EC 81 MG PO TBEC
81.0000 mg | DELAYED_RELEASE_TABLET | Freq: Every day | ORAL | Status: DC
  Administered 2017-02-08 – 2017-02-10 (×3): 81 mg via ORAL
  Filled 2017-02-08 (×3): qty 1

## 2017-02-08 MED ORDER — PANTOPRAZOLE SODIUM 40 MG PO TBEC
40.0000 mg | DELAYED_RELEASE_TABLET | Freq: Every day | ORAL | Status: DC
  Administered 2017-02-08 – 2017-02-10 (×3): 40 mg via ORAL
  Filled 2017-02-08 (×3): qty 1

## 2017-02-08 MED ORDER — DIVALPROEX SODIUM ER 500 MG PO TB24
750.0000 mg | ORAL_TABLET | Freq: Every day | ORAL | Status: DC
  Administered 2017-02-08 – 2017-02-10 (×3): 750 mg via ORAL
  Filled 2017-02-08 (×3): qty 1

## 2017-02-08 MED ORDER — METHYLPREDNISOLONE SODIUM SUCC 40 MG IJ SOLR
40.0000 mg | Freq: Two times a day (BID) | INTRAMUSCULAR | Status: DC
  Administered 2017-02-08 (×2): 40 mg via INTRAVENOUS
  Filled 2017-02-08 (×2): qty 1

## 2017-02-08 MED ORDER — LEVETIRACETAM 500 MG PO TABS
500.0000 mg | ORAL_TABLET | Freq: Two times a day (BID) | ORAL | Status: DC
  Administered 2017-02-08 – 2017-02-10 (×5): 500 mg via ORAL
  Filled 2017-02-08 (×5): qty 1

## 2017-02-08 MED ORDER — AMIODARONE HCL 200 MG PO TABS
200.0000 mg | ORAL_TABLET | Freq: Two times a day (BID) | ORAL | Status: DC
  Administered 2017-02-08 – 2017-02-10 (×4): 200 mg via ORAL
  Filled 2017-02-08 (×5): qty 1

## 2017-02-08 MED ORDER — IPRATROPIUM-ALBUTEROL 0.5-2.5 (3) MG/3ML IN SOLN
3.0000 mL | Freq: Two times a day (BID) | RESPIRATORY_TRACT | Status: DC
  Administered 2017-02-09 – 2017-02-10 (×3): 3 mL via RESPIRATORY_TRACT
  Filled 2017-02-08 (×4): qty 3

## 2017-02-08 MED ORDER — LEVETIRACETAM 750 MG PO TABS: 750.0000 mg | ORAL_TABLET | Freq: Two times a day (BID) | ORAL | Status: DC

## 2017-02-08 MED ORDER — AMIODARONE HCL 100 MG PO TABS
100.0000 mg | ORAL_TABLET | Freq: Every day | ORAL | Status: DC
  Administered 2017-02-08: 100 mg via ORAL
  Filled 2017-02-08: qty 1

## 2017-02-08 NOTE — Progress Notes (Signed)
On a-fib hr-118 , md aware with order. Amiodarone po given. desats to 80's on room air, back on o2 2l [place] . Sat-94% continue to monitor.

## 2017-02-08 NOTE — Plan of Care (Signed)
Patient was intermittently confused through night.  Vitals remained stable but needed to increase o2 support to maintain sats

## 2017-02-08 NOTE — Progress Notes (Signed)
Home o2 eval not done, unable to stand and walk. MD aware claimed to have it done by PT.

## 2017-02-08 NOTE — Progress Notes (Signed)
PROGRESS NOTE  Patrick Holt YHC:[id number] DOB: [birth year] DOA: 02/05/2017 PCP: [facility], [facility]  HPI/Recap of past 24 hours: HPI from [name], NP on 02/05/17 Patrick Holt is a 66 y.o. male with medical Patrick significant for bipolar disorder noncompliant with Holt, Patrick Holt, Patrick Holt, Patrick Holt, Patrick Holt, Patrick Holt, Patrick Holt, Patrick Holt, Patrick Holt, Patrick Holt to the patient's house multiple times in the past 3 days (12/20, 12/21, Patrick today) for reports of Holt. On arrival to the ER he was complaining of back pain.  EMS also documented patient with hypoxemia on room air with saturations in the mid 80s therefore nasal cannula oxygen was applied at 6 L/min.  ABG revealed mild respiratory acidosis with a pH is 7.33 Patrick a P CO2 of 53.2, BiPAP was applied. CK was also elevated at 2175. Pt will be admitted primarily due to his acute hypoxemic Patrick hypercarbic respiratory failure in the context of Patrick Holt exacerbation.  Today, pt reported feeling better, resolved AMS, denies any chest pain, worsening shortness of breath, abdominal pain, fever/chills, nausea/vomiting/diarrhea/constipation. Home O2 eval pending   Assessment/Plan: Principal Problem:   Acute respiratory failure with hypoxia Patrick hypercarbia (HCC) Active Problems:   Rhabdomyolysis   Patrick Holt with acute exacerbation (HCC)   Bipolar 1 disorder (HCC)   Patrick Holt   Patrick Holt   Hand fracture, right   Narcotic Holt (HCC)   Pre-diabetes   Patrick (paroxysmal atrial fibrillation) (HCC)   Frequent Holt   Patrick of Patrick Holt   Seizure disorder (HCC)   Bilateral lower extremity edema  #Acute respiratory failure with hypoxia Patrick hypercarbia/ Patrick Holt with acute exacerbation/severe pulmonary Patrick Holt Improving -Presented to ER after frequent Holt Patrick was  found to be hypoxemic Patrick hypercarbic -Currently on 4L of O2, titrating down, no longer requiring BiPAP -Viral panel negative, CXR showed no focal infiltrate -Start IV Azithromycin for Patrick Holt exacerbation -DuoNeb's, IV Solu-Medrol tapering down -Continue Symbicort -Continuous pulse oximetry -Monitor closely, Bipap PRN -Home O2 eval pending  #Rhabdomyolysis/Frequent Holt Appears chronic -CK 2175 with normal renal function -Alcohol level <10 -D/C IVF -Patient offered SNF placement at least for rehab during previous admission but given his underlying psych condition Patrick paranoia refused placement Patrick insisted on returning home -This admission, pt still refusing SNF placement -Hold statin for now given low-grade concomitant elevation in AST -PT/OT on board  #Moderate aortic stenosis/Patrick of reported syncope/PulmHTN -Pulm HTN 75 mmHg -Vmax 1.48 cm -Potentially contributing to recurrent Holt in the context of dehydration Patrick medication    Noncompliance -?? Sleep apnea, sleep study as an outpt  #Bipolar 1 disorder Stable -Documented noncompliance with Holt -Current Depakote level therapeutic -Continue home Depakote, held xanax due to AMS  #Patrick Holt Soft BP Held Norvasc IV hydralazine prn  #Narcotic Holt  -Refused Nicotine patch  #Patrick (paroxysmal atrial fibrillation)  Currently in NSR, rate controlled -Continue home Holt daily -S/P Holt ggt -Due to frequent Holt Patrick noncompliance, not a candidate for chronic anticoagulation  #Patrick of Patrick Holt -Does not follow-up with rheumatology services secondary to being fired from multiple practices in the context of frequent no-shows -Currently on no thepary, PT/OT as above  #Bilateral lower extremity edema -Appears to have associated stasis dermatitis -Echocardiogram on 12/2016: Hyperdynamic ejection fraction 65-70% with mild focal basal hypertrophy of the septum, grade 1 diastolic  dysfunction, moderate aortic stenosis, Patrick severe pulmonary Patrick Holt  #Hand fracture, right -Appears to  be an ongoing problem since 2017 -X-rays today show no acute fracture but ligamentous injury -He has been instructed to follow-up with his orthopedic physician Dr. Burney Gauze on multiple occasions but still hasn't seen, non-compliant with splint  #Pre-diabetes -Hemoglobin A1c was 5.9 Outpt monitor  #Seizure disorder  -Previous admission unclear if patient had true syncope versus unwitnessed seizure activity -Continue Keppra  #Patrick of CVA -On aspirin Patrick statin prior to admission, held statin due to rhabdo       Code Status: Full  Family Communication: None at bedside  Disposition Plan: SNF placement, pt refusing  Consultants:  None  Procedures:  None  Antimicrobials:  IV Azithromycin   DVT prophylaxis:  Lovenox   Objective: Vitals:   02/07/17 2142 02/08/17 0305 02/08/17 0906 02/08/17 1115  BP:  117/65  127/67  Pulse:  60  73  Resp:  13  13  Temp:  97.6 F (36.4 C)  98.1 F (36.7 C)  TempSrc:    Oral  SpO2: 90% 93% 92% 97%  Weight:  105.2 kg (232 lb)    Height:        Intake/Output Summary (Last 24 hours) at 02/08/2017 1239 Last data filed at 02/08/2017 1120 Gross per 24 hour  Intake 5302.5 ml  Output 1150 ml  Net 4152.5 ml   Filed Weights   02/06/17 0346 02/07/17 0317 02/08/17 0305  Weight: 100.7 kg (222 lb) 108.9 kg (240 lb) 105.2 kg (232 lb)    Exam:   General: Currently alert, awake, oriented, not in acute distress  Cardiovascular: S1, S2 present, irregularly irregular, no added heart sounds  Respiratory: Chest with decreased BS bilaterally  Abdomen: Soft, nontender, nondistended, bowel sounds present  Musculoskeletal: Bilateral 1+ lower extremity edema  Skin: Normal  Psychiatry: Normal mood   Data Reviewed: CBC: Recent Labs  Lab 02/05/17 0919 02/06/17 0238 02/07/17 0246 02/08/17 0304  WBC 7.8 7.4 10.2  15.1*  NEUTROABS 4.6  --  9.1* 13.8*  HGB 13.9 13.2 13.2 12.5*  HCT 44.4 44.2 41.7 40.8  MCV 85.2 87.4 85.8 86.6  PLT 243 246 223 767   Basic Metabolic Panel: Recent Labs  Lab 02/05/17 0919 02/06/17 0238 02/07/17 0246 02/07/17 0748 02/08/17 0304  NA 144 144 141  --  141  K 3.5 4.2 5.5* 4.5 3.9  CL 109 112* 111  --  111  CO2 26 28 24   --  24  GLUCOSE 85 100* 158*  --  184*  BUN 9 6 15   --  17  CREATININE 1.06 0.98 0.91  --  0.94  CALCIUM 8.2* 8.1* 8.3*  --  8.3*   GFR: Estimated Creatinine Clearance: 88.1 mL/min (by C-G formula based on SCr of 0.94 mg/dL). Liver Function Tests: Recent Labs  Lab 02/05/17 0919 02/06/17 0238  AST 55* 46*  ALT 22 22  ALKPHOS 72 67  BILITOT 0.7 0.9  PROT 6.6 6.0*  ALBUMIN 3.2* 2.7*   No results for input(s): LIPASE, AMYLASE in the last 168 hours. Recent Labs  Lab 02/05/17 0919  AMMONIA 35   Coagulation Profile: Recent Labs  Lab 02/05/17 0919  INR 1.25   Cardiac Enzymes: Recent Labs  Lab 02/05/17 0919  CKTOTAL 2,175*   BNP (last 3 results) No results for input(s): PROBNP in the last 8760 hours. HbA1C: No results for input(s): HGBA1C in the last 72 hours. CBG: Recent Labs  Lab 02/05/17 0925  GLUCAP 98   Lipid Profile: No results for input(s): CHOL, HDL, LDLCALC, TRIG, CHOLHDL,  LDLDIRECT in the last 72 hours. Thyroid Function Tests: No results for input(s): TSH, T4TOTAL, FREET4, T3FREE, THYROIDAB in the last 72 hours. Patrick Holt Panel: No results for input(s): VITAMINB12, FOLATE, FERRITIN, TIBC, IRON, RETICCTPCT in the last 72 hours. Urine analysis:    Component Value Date/Time   COLORURINE AMBER (A) 02/05/2017 0920   APPEARANCEUR CLEAR 02/05/2017 0920   LABSPEC 1.024 02/05/2017 0920   PHURINE 5.0 02/05/2017 0920   GLUCOSEU NEGATIVE 02/05/2017 0920   HGBUR NEGATIVE 02/05/2017 0920   HGBUR negative 12/11/2009 1356   BILIRUBINUR NEGATIVE 02/05/2017 0920   BILIRUBINUR MODERATE 06/05/2015 1407   KETONESUR 5 (A)  02/05/2017 0920   PROTEINUR NEGATIVE 02/05/2017 0920   UROBILINOGEN 2.0 06/05/2015 1407   UROBILINOGEN 1.0 12/30/2014 1830   NITRITE NEGATIVE 02/05/2017 0920   LEUKOCYTESUR NEGATIVE 02/05/2017 0920   Sepsis Labs: @LABRCNTIP (procalcitonin:4,lacticidven:4)  ) Recent Results (from the past 240 hour(s))  Respiratory Panel by PCR     Status: None   Collection Time: 02/05/17  1:00 PM  Result Value Ref Range Status   Adenovirus NOT DETECTED NOT DETECTED Final   Coronavirus 229E NOT DETECTED NOT DETECTED Final   Coronavirus HKU1 NOT DETECTED NOT DETECTED Final   Coronavirus NL63 NOT DETECTED NOT DETECTED Final   Coronavirus OC43 NOT DETECTED NOT DETECTED Final   Metapneumovirus NOT DETECTED NOT DETECTED Final   Rhinovirus / Enterovirus NOT DETECTED NOT DETECTED Final   Influenza A NOT DETECTED NOT DETECTED Final   Influenza B NOT DETECTED NOT DETECTED Final   Parainfluenza Virus 1 NOT DETECTED NOT DETECTED Final   Parainfluenza Virus 2 NOT DETECTED NOT DETECTED Final   Parainfluenza Virus 3 NOT DETECTED NOT DETECTED Final   Parainfluenza Virus 4 NOT DETECTED NOT DETECTED Final   Respiratory Syncytial Virus NOT DETECTED NOT DETECTED Final   Bordetella pertussis NOT DETECTED NOT DETECTED Final   Chlamydophila pneumoniae NOT DETECTED NOT DETECTED Final   Mycoplasma pneumoniae NOT DETECTED NOT DETECTED Final  MRSA PCR Screening     Status: None   Collection Time: 02/05/17  1:00 PM  Result Value Ref Range Status   MRSA by PCR NEGATIVE NEGATIVE Final    Comment:        The GeneXpert MRSA Assay (FDA approved for NASAL specimens only), is one component of a comprehensive MRSA colonization surveillance program. It is not intended to diagnose MRSA infection nor to guide or monitor treatment for MRSA infections.       Studies: Dg Chest Port 1 View  Result Date: 02/08/2017 CLINICAL DATA:  Patrick Holt.  Fall with weakness.  Atrial fibrillation. EXAM: PORTABLE CHEST 1 VIEW COMPARISON:   02/05/2017. FINDINGS: The heart is enlarged. Subsegmental atelectatic change the bases without definite consolidation or frank edema. Small BILATERAL effusions are likely. There is no pneumothorax. IMPRESSION: Similar appearance to priors. Cardiomegaly. Bibasilar subsegmental atelectasis. Electronically Signed   By: Staci Righter M.D.   On: 02/08/2017 08:24    Scheduled Meds: . Holt  100 mg Oral Daily  . divalproex  750 mg Oral Daily  . enoxaparin (LOVENOX) injection  40 mg Subcutaneous Q24H  . Influenza vac split quadrivalent PF  0.5 mL Intramuscular Tomorrow-1000  . ipratropium-albuterol  3 mL Nebulization TID  . levETIRAcetam  750 mg Oral BID  . methylPREDNISolone (SOLU-MEDROL) injection  40 mg Intravenous Q12H  . mometasone-formoterol  2 puff Inhalation BID  . nicotine  14 mg Transdermal Daily  . sodium chloride flush  3 mL Intravenous Q12H    Continuous Infusions: .  azithromycin 500 mg (02/08/17 1150)     LOS: 3 days     Alma Friendly, MD [facility]  If 7PM-7AM, please contact night-coverage [email or web address] Password [facility] 02/08/2017, 12:39 PM

## 2017-02-09 ENCOUNTER — Inpatient Hospital Stay (HOSPITAL_COMMUNITY): Payer: Medicare Other

## 2017-02-09 LAB — CBC WITH DIFFERENTIAL/PLATELET
BASOS ABS: 0 10*3/uL (ref 0.0–0.1)
Basophils Relative: 0 %
EOS ABS: 0 10*3/uL (ref 0.0–0.7)
Eosinophils Relative: 0 %
HCT: 41.3 % (ref 39.0–52.0)
HEMOGLOBIN: 12.8 g/dL — AB (ref 13.0–17.0)
LYMPHS ABS: 0.6 10*3/uL — AB (ref 0.7–4.0)
LYMPHS PCT: 4 %
MCH: 26.4 pg (ref 26.0–34.0)
MCHC: 31 g/dL (ref 30.0–36.0)
MCV: 85.2 fL (ref 78.0–100.0)
Monocytes Absolute: 0.4 10*3/uL (ref 0.1–1.0)
Monocytes Relative: 3 %
NEUTROS PCT: 93 %
Neutro Abs: 12 10*3/uL — ABNORMAL HIGH (ref 1.7–7.7)
Platelets: 248 10*3/uL (ref 150–400)
RBC: 4.85 MIL/uL (ref 4.22–5.81)
RDW: 19.5 % — ABNORMAL HIGH (ref 11.5–15.5)
WBC: 13 10*3/uL — AB (ref 4.0–10.5)

## 2017-02-09 LAB — BASIC METABOLIC PANEL
Anion gap: 5 (ref 5–15)
BUN: 14 mg/dL (ref 6–20)
CHLORIDE: 108 mmol/L (ref 101–111)
CO2: 29 mmol/L (ref 22–32)
Calcium: 8.2 mg/dL — ABNORMAL LOW (ref 8.9–10.3)
Creatinine, Ser: 0.9 mg/dL (ref 0.61–1.24)
GFR calc non Af Amer: >60 mL/min (ref >60)
Glucose, Bld: 162 mg/dL — ABNORMAL HIGH (ref 65–99)
POTASSIUM: 3.7 mmol/L (ref 3.5–5.1)
SODIUM: 142 mmol/L (ref 135–145)

## 2017-02-09 LAB — TROPONIN I: Troponin I: <0.03 ng/mL (ref <0.03)

## 2017-02-09 MED ORDER — IOPAMIDOL (ISOVUE-370) INJECTION 76%
INTRAVENOUS | Status: AC
  Administered 2017-02-09: 100 mL
  Filled 2017-02-09: qty 100

## 2017-02-09 MED ORDER — HYDROCODONE-ACETAMINOPHEN 5-325 MG PO TABS: 1.0000 | ORAL_TABLET | ORAL | Status: DC | PRN

## 2017-02-09 MED ORDER — GI COCKTAIL ~~LOC~~
30.0000 mL | Freq: Three times a day (TID) | ORAL | Status: DC | PRN
  Administered 2017-02-09 – 2017-02-10 (×3): 30 mL via ORAL
  Filled 2017-02-09 (×3): qty 30

## 2017-02-09 MED ORDER — PREDNISONE 20 MG PO TABS
40.0000 mg | ORAL_TABLET | Freq: Every day | ORAL | Status: DC
  Administered 2017-02-09 – 2017-02-10 (×2): 40 mg via ORAL
  Filled 2017-02-09 (×2): qty 2

## 2017-02-09 NOTE — Clinical Social Work Note (Signed)
Clinical Social Work Assessment  Patient Details  Name: Patrick Holt MRN: [id number] Date of Birth: Apr 21, 1950  Date of referral:  02/09/17               Reason for consult:  Discharge Planning, Facility Placement                Permission sought to share information with:  Chartered certified accountant granted to share information::  Yes, Verbal Permission Granted  Name::        Agency::  snf  Relationship::     Contact Information:     Housing/Transportation Living arrangements for the past 2 months:  Independent Living Facility(retirement home for seniors) Source of Information:  Patient Patient Interpreter Needed:  None Criminal Activity/Legal Involvement Pertinent to Current Situation/Hospitalization:  No - Comment as needed Significant Relationships:  Friend Lives with:  Self Do you feel safe going back to the place where you live?  Yes Need for family participation in patient care:  Yes (Comment)  Care giving concerns:  No family at bedside. Patient stated he lives in a retirement Artist / plan: CSW met patient at bedside to offer support and discuss discharge needs. Patient stated he lives on the 3rd floor of his apartment complex and he has a motorize scooter to help get him around. Patient stated he does not want to go to SNF because he needs to be at his home by January 1st. Patient stated he needs to be home to pay rent and he does not trust his friend to do it for him. Patient stated he would like home health to come into his home to work on PT/OT with him. CSW to relay message to the [facility] on the floor. CSW signing off as patient is refusing snf   Employment status:  Retired Nurse, adult PT Recommendations:  [place] / Referral to community resources:  [place]  Patient/Family's Response to care:  Patient eager to go home  Patient/Family's  Understanding of and Emotional Response to Diagnosis, Current Treatment, and Prognosis: Patient aware that he will need short term rehab but would like to do PT at home Emotional Assessment Appearance:  Appears stated age Attitude/Demeanor/Rapport:  Other Affect (typically observed):  Anxious, Apprehensive Orientation:  Oriented to Place, Oriented to  Time, Oriented to Situation, Oriented to Self Alcohol / Substance use:  Not Applicable Psych involvement (Current and /or in the community):  No (Comment)  Discharge Needs  Concerns to be addressed:  Care Coordination Readmission within the last 30 days:  Yes Current discharge risk:  Lives alone Barriers to Discharge:  Continued Medical Work up   [employer], LCSW 02/09/2017, 5:27 PM

## 2017-02-09 NOTE — Plan of Care (Signed)
Pt makes statements of GERD-like discomfort; given meds; irritation of throat dec but not completely relieved; pt resting; pt has been NSR all shift but flipped into unsustained Afib per CCMD around 6:30; pt currently sinus; pt desats to 85 at times bc he pulls his oxygen off to the side; educated to leave O2 on at all times; EKG/Troponin completed; IV team stat consult placed after 2 RN's assessed pt for 3rd IV (needs stat Ct) but unsuccessful as pt is hard stick; will continue to monitor/educate.   [place]  Ragan Duhon, RN

## 2017-02-09 NOTE — Progress Notes (Signed)
PROGRESS NOTE  Patrick Holt KXF:[id number] DOB: 1951/01/26 DOA: 02/05/2017 PCP: Medicine, [facility]  HPI/Recap of past 24 hours: HPI from [name], NP on 02/05/17 Patrick Holt is a 66 y.o. male with medical history significant for bipolar disorder noncompliant with medications, PAF on amiodarone, COPD, nonhealing right hand fracture since 2017 in the context of recurrent falls, dermatomyositis, prediabetes, anemia, history of narcotic dependence, hypertension, and seizure disorder on AEDs. EMS has been called to the patient's house multiple times in the past 3 days (12/20, 12/21, and today) for reports of falls. On arrival to the ER he was complaining of back pain.  EMS also documented patient with hypoxemia on room air with saturations in the mid 80s therefore nasal cannula oxygen was applied at 6 L/min.  ABG revealed mild respiratory acidosis with a pH is 7.33 and a P CO2 of 53.2, BiPAP was applied. CK was also elevated at 2175. Pt will be admitted primarily due to his acute hypoxemic and hypercarbic respiratory failure in the context of COPD exacerbation.  Today, pt reported feeling better, resolved AMS, denies any chest pain, worsening shortness of breath, abdominal pain, fever/chills, nausea/vomiting/diarrhea/constipation. Home O2 eval pending. Pt so fixated about going home, "if I am not at my house before 02/15/17, they will give away all my brand new furniture and belongings"   Assessment/Plan: Principal Problem:   Acute respiratory failure with hypoxia and hypercarbia ([place]) Active Problems:   Rhabdomyolysis   COPD with acute exacerbation ([name])   Bipolar 1 disorder ([name])   Anemia   Hypertension   Hand fracture, right   Narcotic dependence ([name])   Pre-diabetes   PAF (paroxysmal atrial fibrillation) (H[facility])   Frequent falls   History of dermatomyositis   Seizure disorder (H[facility])   Bilateral lower extremity edema  #Acute respiratory failure with hypoxia  and hypercarbia/ COPD with acute exacerbation/severe pulmonary hypertension Improving -Presented to ER after frequent falls and was found to be hypoxemic and hypercarbic -Currently on 4L of O2, titrating down, no longer requiring BiPAP -Viral panel negative, CXR showed no focal infiltrate -CT angio to r/o PE Vs PNA -Start IV Azithromycin for COPD exacerbation -DuoNeb's, prednisone -Continue Symbicort -Continuous pulse oximetry -Monitor closely, Bipap PRN -Home O2 eval pending  #Rhabdomyolysis/Frequent falls Appears chronic -CK 2175 with normal renal function -Alcohol level <10 -Patient offered SNF placement at least for rehab during previous admission but given his underlying psych condition and paranoia refused placement and insisted on returning home -This admission, pt still refusing SNF placement -Hold statin for now given low-grade concomitant elevation in AST -PT/OT on board  #Moderate aortic stenosis/history of reported syncope/PulmHTN -Pulm HTN 75 mmHg -Vmax 1.48 cm -Potentially contributing to recurrent falls in the context of dehydration and medication    Noncompliance -?? Sleep apnea, sleep study as an outpt  #Bipolar 1 disorder -Documented noncompliance with medications -Current Depakote level therapeutic -Continue home Depakote, held xanax  #Hypertension Soft BP Held Norvasc IV hydralazine prn  #Narcotic dependence  -Refused Nicotine patch  #PAF (paroxysmal atrial fibrillation)  Currently in NSR, rate controlled -Continue PO amiodarone 200 mg BID, increased from home dose -S/P Amiodarone ggt -Due to frequent falls and noncompliance, not a candidate for chronic anticoagulation  #History of dermatomyositis -Does not follow-up with rheumatology services secondary to being fired from multiple practices in the context of frequent no-shows -Currently on no thepary, PT/OT as above  #Bilateral lower extremity edema -Appears to have associated stasis  dermatitis -Echocardiogram on  12/2016: Hyperdynamic ejection fraction 65-70% with mild focal basal hypertrophy of the septum, grade 1 diastolic dysfunction, moderate aortic stenosis, and severe pulmonary hypertension  #Hand fracture, right -Appears to be an ongoing problem since 2017 -X-rays today show no acute fracture but ligamentous injury -He has been instructed to follow-up with his orthopedic physician Dr. Burney Gauze on multiple occasions but still hasn't seen, non-compliant with splint  #Pre-diabetes -Hemoglobin A1c was 5.9 Outpt monitor  #Seizure disorder  -Previous admission unclear if patient had true syncope versus unwitnessed seizure activity -Continue Keppra  #History of CVA -On aspirin and statin prior to admission, held statin due to rhabdo       Code Status: Full  Family Communication: None at bedside  Disposition Plan: SNF placement, pt refusing  Consultants:  None  Procedures:  None  Antimicrobials:  IV Azithromycin   DVT prophylaxis:  Lovenox   Objective: Vitals:   02/08/17 2320 02/09/17 0454 02/09/17 0751 02/09/17 1012  BP: (!) 152/72 129/78 129/82   Pulse: 84 72 80   Resp: 16 14 14    Temp: 97.9 F (36.6 C) 98.1 F (36.7 C) 97.9 F (36.6 C)   TempSrc: Oral Oral Oral   SpO2: 90% 90% 92% 91%  Weight:  106.1 kg (234 lb)    Height:        Intake/Output Summary (Last 24 hours) at 02/09/2017 1116 Last data filed at 02/09/2017 0917 Gross per 24 hour  Intake 276 ml  Output 1000 ml  Net -724 ml   Filed Weights   02/07/17 0317 02/08/17 0305 02/09/17 0454  Weight: 108.9 kg (240 lb) 105.2 kg (232 lb) 106.1 kg (234 lb)    Exam:   General: Currently alert, awake, oriented, not in acute distress  Cardiovascular: S1, S2 present, irregularly irregular, no added heart sounds  Respiratory: Chest with decreased BS bilaterally  Abdomen: Soft, nontender, nondistended, bowel sounds present  Musculoskeletal: Bilateral 1+ lower  extremity edema  Skin: Normal  Psychiatry: Normal mood   Data Reviewed: CBC: Recent Labs  Lab 02/05/17 0919 02/06/17 0238 02/07/17 0246 02/08/17 0304 02/09/17 0211  WBC 7.8 7.4 10.2 15.1* 13.0*  NEUTROABS 4.6  --  9.1* 13.8* 12.0*  HGB 13.9 13.2 13.2 12.5* 12.8*  HCT 44.4 44.2 41.7 40.8 41.3  MCV 85.2 87.4 85.8 86.6 85.2  PLT 243 246 223 252 245   Basic Metabolic Panel: Recent Labs  Lab 02/05/17 0919 02/06/17 0238 02/07/17 0246 02/07/17 0748 02/08/17 0304 02/09/17 0211  NA 144 144 141  --  141 142  K 3.5 4.2 5.5* 4.5 3.9 3.7  CL 109 112* 111  --  111 108  CO2 26 28 24   --  24 29  GLUCOSE 85 100* 158*  --  184* 162*  BUN 9 6 15   --  17 14  CREATININE 1.06 0.98 0.91  --  0.94 0.90  CALCIUM 8.2* 8.1* 8.3*  --  8.3* 8.2*   GFR: Estimated Creatinine Clearance: 92.5 mL/min (by C-G formula based on SCr of 0.9 mg/dL). Liver Function Tests: Recent Labs  Lab 02/05/17 0919 02/06/17 0238  AST 55* 46*  ALT 22 22  ALKPHOS 72 67  BILITOT 0.7 0.9  PROT 6.6 6.0*  ALBUMIN 3.2* 2.7*   No results for input(s): LIPASE, AMYLASE in the last 168 hours. Recent Labs  Lab 02/05/17 0919  AMMONIA 35   Coagulation Profile: Recent Labs  Lab 02/05/17 0919  INR 1.25   Cardiac Enzymes: Recent Labs  Lab 02/05/17 0919  CKTOTAL 2,175*   BNP (last 3 results) No results for input(s): PROBNP in the last 8760 hours. HbA1C: No results for input(s): HGBA1C in the last 72 hours. CBG: Recent Labs  Lab 02/05/17 0925  GLUCAP 98   Lipid Profile: No results for input(s): CHOL, HDL, LDLCALC, TRIG, CHOLHDL, LDLDIRECT in the last 72 hours. Thyroid Function Tests: No results for input(s): TSH, T4TOTAL, FREET4, T3FREE, THYROIDAB in the last 72 hours. Anemia Panel: No results for input(s): VITAMINB12, FOLATE, FERRITIN, TIBC, IRON, RETICCTPCT in the last 72 hours. Urine analysis:    Component Value Date/Time   COLORURINE AMBER (A) 02/05/2017 0920   APPEARANCEUR CLEAR 02/05/2017  0920   LABSPEC 1.024 02/05/2017 0920   PHURINE 5.0 02/05/2017 0920   GLUCOSEU NEGATIVE 02/05/2017 0920   HGBUR NEGATIVE 02/05/2017 0920   HGBUR negative 12/11/2009 1356   BILIRUBINUR NEGATIVE 02/05/2017 0920   BILIRUBINUR MODERATE 06/05/2015 1407   KETONESUR 5 (A) 02/05/2017 0920   PROTEINUR NEGATIVE 02/05/2017 0920   UROBILINOGEN 2.0 06/05/2015 1407   UROBILINOGEN 1.0 12/30/2014 1830   NITRITE NEGATIVE 02/05/2017 0920   LEUKOCYTESUR NEGATIVE 02/05/2017 0920   Sepsis Labs: @LABRCNTIP (procalcitonin:4,lacticidven:4)  ) Recent Results (from the past 240 hour(s))  Respiratory Panel by PCR     Status: None   Collection Time: 02/05/17  1:00 PM  Result Value Ref Range Status   Adenovirus NOT DETECTED NOT DETECTED Final   Coronavirus 229E NOT DETECTED NOT DETECTED Final   Coronavirus HKU1 NOT DETECTED NOT DETECTED Final   Coronavirus NL63 NOT DETECTED NOT DETECTED Final   Coronavirus OC43 NOT DETECTED NOT DETECTED Final   Metapneumovirus NOT DETECTED NOT DETECTED Final   Rhinovirus / Enterovirus NOT DETECTED NOT DETECTED Final   Influenza A NOT DETECTED NOT DETECTED Final   Influenza B NOT DETECTED NOT DETECTED Final   Parainfluenza Virus 1 NOT DETECTED NOT DETECTED Final   Parainfluenza Virus 2 NOT DETECTED NOT DETECTED Final   Parainfluenza Virus 3 NOT DETECTED NOT DETECTED Final   Parainfluenza Virus 4 NOT DETECTED NOT DETECTED Final   Respiratory Syncytial Virus NOT DETECTED NOT DETECTED Final   Bordetella pertussis NOT DETECTED NOT DETECTED Final   Chlamydophila pneumoniae NOT DETECTED NOT DETECTED Final   Mycoplasma pneumoniae NOT DETECTED NOT DETECTED Final  MRSA PCR Screening     Status: None   Collection Time: 02/05/17  1:00 PM  Result Value Ref Range Status   MRSA by PCR NEGATIVE NEGATIVE Final    Comment:        The GeneXpert MRSA Assay (FDA approved for NASAL specimens only), is one component of a comprehensive MRSA colonization surveillance program. It is  not intended to diagnose MRSA infection nor to guide or monitor treatment for MRSA infections.       Studies: No results found.  Scheduled Meds: . amiodarone  200 mg Oral BID  . aspirin EC  81 mg Oral Daily  . divalproex  750 mg Oral Daily  . enoxaparin (LOVENOX) injection  40 mg Subcutaneous Q24H  . Influenza vac split quadrivalent PF  0.5 mL Intramuscular Tomorrow-1000  . ipratropium-albuterol  3 mL Nebulization BID  . levETIRAcetam  500 mg Oral BID  . mometasone-formoterol  2 puff Inhalation BID  . nicotine  14 mg Transdermal Daily  . pantoprazole  40 mg Oral Daily  . predniSONE  40 mg Oral Q breakfast  . sodium chloride flush  3 mL Intravenous Q12H    Continuous Infusions: . azithromycin Stopped (02/08/17 1243)  LOS: 4 days     Alma Friendly, MD [facility]  If 7PM-7AM, please contact night-coverage [email or web address] Password [email or web address] 02/09/2017, 11:16 AM

## 2017-02-10 ENCOUNTER — Encounter (HOSPITAL_COMMUNITY): Payer: Self-pay

## 2017-02-10 DIAGNOSIS — J410 Simple chronic bronchitis: Secondary | ICD-10-CM

## 2017-02-10 LAB — CBC WITH DIFFERENTIAL/PLATELET
BASOS ABS: 0 10*3/uL (ref 0.0–0.1)
Basophils Relative: 0 %
Eosinophils Absolute: 0 10*3/uL (ref 0.0–0.7)
Eosinophils Relative: 0 %
HEMATOCRIT: 43.4 % (ref 39.0–52.0)
Hemoglobin: 13.8 g/dL (ref 13.0–17.0)
LYMPHS PCT: 13 %
Lymphs Abs: 1.3 10*3/uL (ref 0.7–4.0)
MCH: 26.5 pg (ref 26.0–34.0)
MCHC: 31.8 g/dL (ref 30.0–36.0)
MCV: 83.5 fL (ref 78.0–100.0)
MONO ABS: 0.9 10*3/uL (ref 0.1–1.0)
Monocytes Relative: 8 %
NEUTROS ABS: 8.2 10*3/uL — AB (ref 1.7–7.7)
Neutrophils Relative %: 79 %
Platelets: 254 10*3/uL (ref 150–400)
RBC: 5.2 MIL/uL (ref 4.22–5.81)
RDW: 18.8 % — AB (ref 11.5–15.5)
WBC: 10.4 10*3/uL (ref 4.0–10.5)

## 2017-02-10 LAB — BASIC METABOLIC PANEL
ANION GAP: 9 (ref 5–15)
BUN: 12 mg/dL (ref 6–20)
CHLORIDE: 97 mmol/L — AB (ref 101–111)
CO2: 33 mmol/L — AB (ref 22–32)
Calcium: 8.2 mg/dL — ABNORMAL LOW (ref 8.9–10.3)
Creatinine, Ser: 0.72 mg/dL (ref 0.61–1.24)
GFR calc Af Amer: >60 mL/min (ref >60)
GLUCOSE: 131 mg/dL — AB (ref 65–99)
POTASSIUM: 3.5 mmol/L (ref 3.5–5.1)
Sodium: 139 mmol/L (ref 135–145)

## 2017-02-10 LAB — MAGNESIUM: Magnesium: 2.1 mg/dL (ref 1.7–2.4)

## 2017-02-10 MED ORDER — AMIODARONE HCL 200 MG PO TABS: 200.0000 mg | ORAL_TABLET | Freq: Every day | ORAL | 0 refills | Status: DC

## 2017-02-10 MED ORDER — PREDNISONE 20 MG PO TABS: 10.0000 mg | ORAL_TABLET | Freq: Every day | ORAL | 0 refills | Status: AC

## 2017-02-10 MED ORDER — IPRATROPIUM-ALBUTEROL 0.5-2.5 (3) MG/3ML IN SOLN
3.0000 mL | RESPIRATORY_TRACT | Status: DC | PRN
Start: 2017-02-10 — End: 2017-02-10

## 2017-02-10 NOTE — Progress Notes (Addendum)
Physical Therapy Treatment Patient Details Name: Patrick Holt MRN: [id number] DOB: August 14, 1950 Today's Date: 02/10/2017    History of Present Illness Pt is a 66 y.o. male with PMH significant forbipolar disorder noncompliant with meds, PAF on amiodarone, COPD, nonhealing right hand fx since 2017 in the context of recurrent falls, dermatomyositis, prediabetes, anemia, h/o narcotic dependence, HTN, and seizure disorder on AEDs.EMS has been called to the pt's home multiple times in the past 3 days(12/20, 12/21, and 12/22)forreports of falls, and brought to ED on 12/22 for this and admitted primarily due to his acute hypoxemic and hypercarbic respiratory failure in the context of COPD exacerbation. Of note, recent admission 2 weeks ago after being found down with AMS.    PT Comments    Pt requiring decreased assistance during session this am.  Pt appears to be at baseline as he reports using power scooter for majority of mobility.  Pt reports he has an aide that helps him bathe.  Pt appears safe to return home and will inform supervising PT of need for change in recommendations.  Pt performed treatment with focus on evaluating need for home O2.  Sensor probe on finger not producing and accurate wave form therefore probe place on R ear lobe.  SATURATION QUALIFICATIONS: (This note is used to comply with regulatory documentation for home oxygen)  Patient Saturations on Room Air at Rest =96%  Patient Saturations on Room Air while Ambulating = 92%  Patient Saturations on 0 Liters of oxygen while Ambulating = 92%  Please briefly explain why patient needs home oxygen: Per assessment patient does not require home O2.     BP pre session 127/81 with no complaints of dizziness throughout tx.   Follow Up Recommendations  Home health PT     Equipment Recommendations  None recommended by PT    Recommendations for Other Services       Precautions / Restrictions Precautions Precautions:  Fall Restrictions Weight Bearing Restrictions: No    Mobility  Bed Mobility Overal bed mobility: Needs Assistance Bed Mobility: Supine to Sit Rolling: Supervision         General bed mobility comments: Pt able to perform with increased time and intermittent use of railing.  Bed placed in flat position to simulate home environment.    Transfers Overall transfer level: Needs assistance Equipment used: Rolling walker (2 wheeled) Transfers: Sit to/from Stand Sit to Stand: Min guard         General transfer comment: Cues for hand placement to and from seated surface.  Pt performed multiple reps with min guard for safety.   Ambulation/Gait Ambulation/Gait assistance: Min guard Ambulation Distance (Feet): 50 Feet(+ 40 ft) Assistive device: Rolling walker (2 wheeled) Gait Pattern/deviations: Step-through pattern;Trunk flexed Gait velocity: Decreased Gait velocity interpretation: Below normal speed for age/gender General Gait Details: Cues for upper trunk control and RW safety.  Cues to step closer to RW for support.  Pt on RA able to maintain 02 sats at 92% and above.     Stairs            Wheelchair Mobility    Modified Rankin (Stroke Patients Only)       Balance Overall balance assessment: Needs assistance   Sitting balance-Leahy Scale: Fair       Standing balance-Leahy Scale: Fair                              Cognition Arousal/Alertness: Awake/alert  Behavior During Therapy: Anxious Overall Cognitive Status: No family/caregiver present to determine baseline cognitive functioning                                 General Comments: Pt remains hyperfocused on financial status and needing to return home to pay his rent.  Pt remains to require constant redirection during treatment.        Exercises      General Comments        Pertinent Vitals/Pain Pain Assessment: No/denies pain    Home Living                       Prior Function            PT Goals (current goals can now be found in the care plan section) Acute Rehab PT Goals Patient Stated Goal: To get home before the 1st of January and pay his rent Potential to Achieve Goals: Good Progress towards PT goals: Progressing toward goals    Frequency    Min 2X/week      PT Plan Discharge plan needs to be updated    Co-evaluation              AM-PAC PT "6 Clicks" Daily Activity  Outcome Measure  Difficulty turning over in bed (including adjusting bedclothes, sheets and blankets)?: A Little Difficulty moving from lying on back to sitting on the side of the bed? : A Little Difficulty sitting down on and standing up from a chair with arms (e.g., wheelchair, bedside commode, etc,.)?: Unable Help needed moving to and from a bed to chair (including a wheelchair)?: A Little Help needed walking in hospital room?: A Little Help needed climbing 3-5 steps with a railing? : A Lot 6 Click Score: 15    End of Session Equipment Utilized During Treatment: Gait belt;Oxygen Activity Tolerance: Patient tolerated treatment well Patient left: in bed;with call bell/phone within reach Nurse Communication: Mobility status       Time: 1025-1059 PT Time Calculation (min) (ACUTE ONLY): 34 min  Charges:  $Gait Training: 8-22 mins $Therapeutic Activity: 8-22 mins                    G Codes:      Governor Rooks, PTA pager [place] 02/10/2017, 1:07 PM

## 2017-02-10 NOTE — Progress Notes (Signed)
CSW was consulted by RN to assist patient in getting a ride home through [place]. [facility] was called and paperwork for transport has been placed on patients chart. CSW signing off as patient no longer has needs.   Rhea Pink, MSW,  [place]

## 2017-02-10 NOTE — Progress Notes (Signed)
Discharged home by [facility] ambulance, stable, discharge instructions given to pt. Belongings taken home.

## 2017-02-10 NOTE — Care Management Note (Addendum)
Case Management Note  Patient Details  Name: TREYVION DURKEE MRN: [id number] Date of Birth: [birth year]  Subjective/Objective:   Pt admitted on 02/05/17 with acute hypoxemic and hypercarbic respiratory failure due to COPD exacerbation.  PTA, pt resided at home alone at [employer].   PT/OT originally recommending SNF, but pt refused.  Pt has progressed to home with [facility] services today with therapy.                Action/Plan: Pt medically stable for discharge home today, per MD.  He has weaned completely from O2, and sats are WNL.  Pt agreeable to [facility] follow up; he has no preference for [facility] agency.  Referral to [facility] services.  Pt denies any DME needs for home.  Pt requests [facility] transport for discharge home.  CSW consulted to facilitate this.    Expected Discharge Date:  02/10/17               Expected Discharge Plan:  [place]  In-House Referral:  Clinical Social Work  Discharge planning Services  CM Consult  Post Acute Care Choice:    Choice offered to:  Patient  DME Arranged:    DME Agency:     HH Arranged:  RN, PT, OT HH Agency:  [place]  Status of Service:  Completed, signed off  If discussed at [place] of Stay Meetings, dates discussed:    Additional Comments:  Reinaldo Raddle, RN, BSN  Trauma/Neuro ICU Case Manager [phone]

## 2017-02-11 NOTE — Discharge Summary (Signed)
Discharge Summary  Patrick Holt:[id number] DOB: 16-Oct-1950  PCP: [facility]  Admit date: 02/05/2017 Discharge date: 02/10/2017  Time spent: >62mins  Recommendations for Outpatient Follow-up:  1. PCP 2. Cardiology  Discharge Diagnoses:  Active Hospital Problems   Diagnosis Date Noted  . Acute respiratory failure with hypoxia and hypercarbia ([place]) 02/05/2017  . Rhabdomyolysis 02/05/2017  . COPD with acute exacerbation ([place]) 02/05/2017  . Bipolar 1 disorder ([place]) 02/05/2017  . Anemia 02/05/2017  . Hypertension 02/05/2017  . Hand fracture, right 02/05/2017  . Narcotic dependence ([place]) 02/05/2017  . Pre-diabetes 02/05/2017  . PAF (paroxysmal atrial fibrillation) ([place]) 02/05/2017  . Frequent falls 02/05/2017  . History of dermatomyositis 02/05/2017  . Seizure disorder ([place]) 02/05/2017  . Bilateral lower extremity edema 02/05/2017    Resolved Hospital Problems  No resolved problems to display.    Discharge Condition: Stable  Diet recommendation: Heart healthy, mod carb   Vitals:   02/10/17 1225 02/10/17 1710  BP: 114/77 118/72  Pulse: 73 69  Resp: 14 18  Temp: 98.7 F (37.1 C)   SpO2: 96% 91%    History of present illness:  Patrick Holt a 66 y.o.malewith medical history significant forbipolar disorder noncompliant with medications, PAF on amiodarone, COPD, nonhealing right hand fracture since 2017 in the context of recurrent falls, dermatomyositis, prediabetes, anemia, history of narcotic dependence, hypertension, and seizure disorder on AEDs. EMS has been called to the patient's house multiple times in the past 3 days(12/20, 12/21, and today)for reports of falls. On arrival to the ER he was complaining of back pain. EMS also documented patient with hypoxemia on room air with saturations in the mid 80s therefore nasal cannula oxygen was applied at 6 L/min. ABG revealed mild respiratory acidosis with a pH is 7.33 and a P CO2  of 53.2, BiPAP was applied. CK was also elevated at 2175. Pt will be admitted primarily due to his acute hypoxemic and hypercarbic respiratory failure in the context of COPD exacerbation.  Today, pt reported feeling better, resolved AMS, denies any chest pain, worsening shortness of breath, abdominal pain, fever/chills, nausea/vomiting/diarrhea/constipation. Pt stable to be discharged.  Hospital Course:  Principal Problem:   Acute respiratory failure with hypoxia and hypercarbia (HCC) Active Problems:   Rhabdomyolysis   COPD with acute exacerbation (HCC)   Bipolar 1 disorder (HCC)   Anemia   Hypertension   Hand fracture, right   Narcotic dependence (HCC)   Pre-diabetes   PAF (paroxysmal atrial fibrillation) (HCC)   Frequent falls   History of dermatomyositis   Seizure disorder (HCC)   Bilateral lower extremity edema  #Acute respiratory failure with hypoxia and hypercarbia/COPD with acute exacerbation/severe pulmonary hypertension Improved -Presented to ER after frequent falls and was found to be hypoxemic and hypercarbic -No longer requiring O2 [place] or BiPAP -Viral panel negative, CXR showed no focal infiltrate -CT angio negative for PE Vs PNA -S/p IV Azithromycin for COPD exacerbation -Prednisone -Continue Symbicort -Home O2 eval: Pt doesn't need O2, saturating well on RA as well while ambulating  #Rhabdomyolysis/Frequent falls Appears chronic -CK 2175 with normal renal function -Alcohol level <10 -Patient offered SNF placement at least for rehab during previous admission but given his underlying psych condition and paranoia refused placement and insisted on returning home -This admission, pt still refusing SNF placement -D/C pt on home health PT/OT/RN  #Moderate aortic stenosis/history of reported syncope/PulmHTN -Pulm HTN 75 mmHg -Vmax 1.48 cm -Potentially contributing to recurrent falls in the context of  dehydration and medication    Noncompliance -?? Sleep apnea,  sleep study as an outpt  #Bipolar 1 disorder -Documented noncompliance with medications -Current Depakote level therapeutic -Continue home Depakote, xanax  #Hypertension Continue Norvasc  #Narcotic dependence -Refused Nicotine patch  #PAF (paroxysmal atrial fibrillation)  Currently in NSR, rate controlled -Continue PO amiodarone 100mg  BID, increased from home dose -S/P Amiodarone ggt -Due to frequent falls and noncompliance, not a candidate for chronic anticoagulation  #History of dermatomyositis -Does not follow-up with rheumatology services secondary to being fired from multiple practices in the context of frequent no-shows  #Bilateral lower extremity edema -Appears to have associated stasis dermatitis -Echocardiogram on 12/2016: Hyperdynamic ejection fraction 65-70% with mild focal basal hypertrophy of the septum, grade 1 diastolic dysfunction, moderate aortic stenosis, and severe pulmonary hypertension  #Hand fracture, right -Appears to be an ongoing problem since 2017 -X-rays today show no acute fracture but ligamentous injury -He has been instructed to follow-up with his orthopedic physician Dr. Burney Gauze on multiple occasions but still hasn't seen, non-compliant with splint  #Pre-diabetes -Hemoglobin A1c was 5.9 Outpt monitor  #Seizure disorder  -Previous admission unclear if patient had true syncope versus unwitnessed seizure activity -Continue Keppra  #History of CVA -On aspirin and statin prior to admission   Procedures:  NOne  Consultations: NOne   Discharge Exam: BP 118/72   Pulse 69   Temp 98.7 F (37.1 C) (Oral)   Resp 18   Ht 5\' 7"  (1.702 m)   Wt 105.2 kg (232 lb)   SpO2 91%   BMI 36.34 kg/m   General: AAO X 3, NAD Cardiovascular: S1-S2 present, no added heart sound  Respiratory: Decreased BS bilaterally   Discharge Instructions You were cared for by a hospitalist during your hospital stay. If you have any questions about  your discharge medications or the care you received while you were in the hospital after you are discharged, you can call the unit and asked to speak with the hospitalist on call if the hospitalist that took care of you is not available. Once you are discharged, your primary care physician will handle any further medical issues. Please note that NO REFILLS for any discharge medications will be authorized once you are discharged, as it is imperative that you return to your primary care physician (or establish a relationship with a primary care physician if you do not have one) for your aftercare needs so that they can reassess your need for medications and monitor your lab values.  Discharge Instructions    Diet - low sodium heart healthy   Complete by:  As directed    Increase activity slowly   Complete by:  As directed      Allergies as of 02/10/2017      Reactions   Immune Globulins Other (See Comments)   Acute renal failure   Ciprofloxacin Swelling   Flagyl [metronidazole] Swelling   Lisinopril Diarrhea   Sulfa Antibiotics Other (See Comments)   blisters   Morphine And Related    Does not help with pain   Requip [ropinirole Hcl]    constipation   Betamethasone Dipropionate Other (See Comments)   Unknown   Bupropion Hcl Other (See Comments)   Unknown   Clobetasol Other (See Comments)   Unknown   Codeine Other (See Comments)   Does not help with pain   Escitalopram Oxalate Other (See Comments)   Unknown   Fluoxetine Hcl Other (See Comments)   Unknown   Furosemide Other (See  Comments)   Unknown   Meclizine Rash   Paroxetine Other (See Comments)   Unknown   Penicillins Other (See Comments)   Unknown reaction Has patient had a PCN reaction causing immediate rash, facial/tongue/throat swelling, SOB or lightheadedness with hypotension: unknown Has patient had a PCN reaction causing severe rash involving mucus membranes or skin necrosis: unknown Has patient had a PCN reaction  that required hospitalization unknown Has patient had a PCN reaction occurring within the last 10 years: unknown If all of the above answers are "NO", then may proceed with Cephalosporin use. Unknown Unknown reactio   Tacrolimus Other (See Comments)   Unknown   Tetanus Toxoid Other (See Comments)   Unknown   Tuberculin Purified Protein Derivative Other (See Comments)   Unknown      Medication List    TAKE these medications   ALPRAZolam 1 MG tablet Commonly known as:  XANAX Take 1 mg by mouth 3 (three) times daily.   amiodarone 200 MG tablet Commonly known as:  PACERONE Take 1 tablet (200 mg total) by mouth daily. What changed:  how much to take   amLODipine 5 MG tablet Commonly known as:  NORVASC Take 1 tablet (5 mg total) by mouth daily.   aspirin EC 81 MG tablet Take 1 tablet (81 mg total) by mouth daily.   atorvastatin 40 MG tablet Commonly known as:  LIPITOR Take 1 tablet (40 mg total) by mouth daily.   budesonide-formoterol 160-4.5 MCG/ACT inhaler Commonly known as:  SYMBICORT Inhale 2 puffs into the lungs 2 (two) times daily.   divalproex 250 MG 24 hr tablet Commonly known as:  DEPAKOTE ER Take 3 tablets (750 mg total) by mouth daily.   ergocalciferol 50000 units capsule Commonly known as:  VITAMIN D2 Take 50,000 Units by mouth once a week.   esomeprazole 40 MG capsule Commonly known as:  NEXIUM Take 40 mg by mouth daily.   ipratropium-albuterol 0.5-2.5 (3) MG/3ML Soln Commonly known as:  DUONEB Inhale 3 mLs into the lungs every 6 (six) hours as needed. What changed:  reasons to take this   levETIRAcetam 500 MG tablet Commonly known as:  KEPPRA Take 500 mg by mouth 2 (two) times daily.   loperamide 2 MG capsule Commonly known as:  IMODIUM Take 2 mg by mouth as needed for diarrhea or loose stools.   multivitamin with minerals Tabs tablet Take 1 tablet by mouth daily.   oxybutynin 5 MG tablet Commonly known as:  DITROPAN Take 5 mg by mouth  daily.   predniSONE 20 MG tablet Commonly known as:  DELTASONE Take 0.5 tablets (10 mg total) by mouth daily with breakfast for 2 days.   traMADol 50 MG tablet Commonly known as:  ULTRAM Take 50 mg by mouth every 6 (six) hours as needed for moderate pain.      Allergies  Allergen Reactions  . Immune Globulins Other (See Comments)    Acute renal failure  . Ciprofloxacin Swelling  . Flagyl [Metronidazole] Swelling  . Lisinopril Diarrhea  . Sulfa Antibiotics Other (See Comments)    blisters  . Morphine And Related     Does not help with pain  . Requip [Ropinirole Hcl]     constipation  . Betamethasone Dipropionate Other (See Comments)    Unknown  . Bupropion Hcl Other (See Comments)    Unknown  . Clobetasol Other (See Comments)    Unknown  . Codeine Other (See Comments)    Does not help with pain  .  Escitalopram Oxalate Other (See Comments)    Unknown  . Fluoxetine Hcl Other (See Comments)    Unknown  . Furosemide Other (See Comments)    Unknown  . Meclizine Rash  . Paroxetine Other (See Comments)    Unknown  . Penicillins Other (See Comments)    Unknown reaction Has patient had a PCN reaction causing immediate rash, facial/tongue/throat swelling, SOB or lightheadedness with hypotension: unknown Has patient had a PCN reaction causing severe rash involving mucus membranes or skin necrosis: unknown Has patient had a PCN reaction that required hospitalization unknown Has patient had a PCN reaction occurring within the last 10 years: unknown If all of the above answers are "NO", then may proceed with Cephalosporin use. Unknown Unknown reactio  . Tacrolimus Other (See Comments)    Unknown  . Tetanus Toxoid Other (See Comments)    Unknown  . Tuberculin Purified Protein Derivative Other (See Comments)    Unknown   Follow-up Information    [facility]. Schedule an appointment as soon as possible for a visit in 1 week(s).   Contact  information: [place] [address] [place] Follow up.   Why:  Nurse, Physical and Occupational therapist to follow up with you at home.   Contact information: Phone:  [phone]           The results of significant diagnostics from this hospitalization (including imaging, microbiology, ancillary and laboratory) are listed below for reference.    Significant Diagnostic Studies: Dg Chest 2 View  Result Date: 02/05/2017 CLINICAL DATA:  Altered mental status after fall today. EXAM: CHEST  2 VIEW COMPARISON:  Radiograph of January 13, 2017. FINDINGS: Stable cardiomegaly. No pneumothorax is noted. Mild left basilar atelectasis is noted. Mild right pleural effusion is noted with associated atelectasis. Bony thorax is unremarkable. IMPRESSION: Mild bibasilar subsegmental atelectasis with mild right pleural effusion. Electronically Signed   By: Marijo Conception, M.D.   On: 02/05/2017 10:52   Dg Lumbar Spine Complete  Result Date: 02/05/2017 CLINICAL DATA:  Altered mental status after fall today. EXAM: LUMBAR SPINE - COMPLETE 4+ VIEW COMPARISON:  Radiographs of December 17, 2016. FINDINGS: No fracture or spondylolisthesis is noted. Moderate degenerative disc disease is noted at L2-3 with anterior osteophyte formation. Mild degenerative disc disease is noted at L1 to and L3-4. Atherosclerosis of thoracic aorta is noted. IMPRESSION: Multilevel degenerative disc disease. No acute abnormality seen the lumbar spine. Electronically Signed   By: Marijo Conception, M.D.   On: 02/05/2017 10:54   Dg Wrist Complete Right  Result Date: 02/05/2017 CLINICAL DATA:  Right wrist deformity after fall today. EXAM: RIGHT WRIST - COMPLETE 3+ VIEW COMPARISON:  Radiographs of January 15, 2017. FINDINGS: Stable widening of scapholunate space is noted. Continued presence of distal first metacarpal fracture is noted. Stable deformity of distal radius and  ulna is noted consistent with old healed fractures. No new abnormality is noted. IMPRESSION: Stable distal first metacarpal fracture as well as stable deformity of distal right radius and ulna consistent with old healed fractures. No new abnormality is noted. Electronically Signed   By: Marijo Conception, M.D.   On: 02/05/2017 11:00   Dg Wrist Complete Right  Result Date: 01/15/2017 CLINICAL DATA:  Fall several days ago.  Pain. EXAM: RIGHT WRIST - COMPLETE 3+ VIEW COMPARISON:  12/27/2016 FINDINGS: Fracture again noted through the distal right first metacarpal  with increasing angulation. Old healed distal radial and ulnar fractures with deformity. Widening of the scapholunate space compatible with dissociation, stable. No new fracture. IMPRESSION: Increasing angulation through the scratched at increasing angulation at the distal right first metacarpal fracture. Scapholunate dissociation, stable. Electronically Signed   By: Rolm Baptise M.D.   On: 01/15/2017 13:22   Ct Head Wo Contrast  Result Date: 02/05/2017 CLINICAL DATA:  Altered mental status beginning this morning. 01/13/2017 EXAM: CT HEAD WITHOUT CONTRAST TECHNIQUE: Contiguous axial images were obtained from the base of the skull through the vertex without intravenous contrast. COMPARISON:  None. FINDINGS: Brain: No evidence of acute infarction, hemorrhage, hydrocephalus, extra-axial collection or mass lesion/mass effect. Ventricular sulcal enlargement is noted consistent with mild atrophy. There are patchy areas of white matter hypoattenuation consistent with mild chronic microvascular ischemic change. A small old lacune infarct is noted in the pons centrally. These findings are stable. Vascular: No hyperdense vessel or unexpected calcification. Skull: Normal. Negative for fracture or focal lesion. Sinuses/Orbits: Globes and orbits are unremarkable. Sinuses and mastoid air cells are clear. Other: None. IMPRESSION: 1. No acute intracranial abnormalities.  2. Mild atrophy and chronic microvascular ischemic change. Stable appearance from the recent prior exam. Electronically Signed   By: Lajean Manes M.D.   On: 02/05/2017 10:10   Ct Head Wo Contrast  Result Date: 01/13/2017 CLINICAL DATA:  Altered mental status, unwitnessed fall EXAM: CT HEAD WITHOUT CONTRAST TECHNIQUE: Contiguous axial images were obtained from the base of the skull through the vertex without intravenous contrast. COMPARISON:  12/17/2016 FINDINGS: Brain: No acute territorial infarction, hemorrhage or intracranial mass is visualized. 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 Mild to moderate small vessel ischemic changes of the white matter. Atrophy. Stable ventricle size Vascular: No hyperdense vessels.  Carotid artery calcification Skull: No fracture Sinuses/Orbits: Mild mucosal thickening in the ethmoid sinuses. No acute orbital abnormality. Other: None IMPRESSION: 1. No CT evidence for acute intracranial abnormality. 2. Atrophy and small vessel ischemic changes of the white matter Electronically Signed   By: Donavan Foil M.D.   On: 01/13/2017 18:17   Ct Angio Chest Pe W Or Wo Contrast  Result Date: 02/09/2017 CLINICAL DATA:  Acute onset of generalized chest pain and shortness of breath. EXAM: CT ANGIOGRAPHY CHEST WITH CONTRAST TECHNIQUE: Multidetector CT imaging of the chest was performed using the standard protocol during bolus administration of intravenous contrast. Multiplanar CT image reconstructions and MIPs were obtained to evaluate the vascular anatomy. CONTRAST:  118mL ISOVUE-370 IOPAMIDOL (ISOVUE-370) INJECTION 76% COMPARISON:  Chest radiograph performed 02/08/2017 FINDINGS: Cardiovascular: There is no evidence of significant pulmonary embolus. The heart is normal in size. Scattered coronary artery calcifications are seen. Mild calcification is noted along the thoracic aorta and proximal great vessels. Mediastinum/Nodes: No mediastinal lymphadenopathy is seen. No pericardial effusion  is identified. The visualized portions of the thyroid gland are unremarkable. No axillary lymphadenopathy is appreciated. Mild tracheobronchomalacia is noted. Lungs/Pleura: Small bilateral pleural effusions are noted. There is partial consolidation of both lower lung lobes, which may reflect atelectasis or pneumonia. Mild interstitial prominence is noted. No pneumothorax is seen. Upper Abdomen: The visualized portions of the liver and spleen are unremarkable. Minimal soft tissue inflammation about the gallbladder is nonspecific. The visualized portions of the adrenal glands are within normal limits. Musculoskeletal: No acute osseous abnormalities are identified. The visualized musculature is unremarkable in appearance. Review of the MIP images confirms the above findings. IMPRESSION: 1. No evidence of significant pulmonary embolus. 2. Small bilateral pleural  effusions. Partial consolidation of both lower lung lobes, which may reflect atelectasis or pneumonia. Mild interstitial prominence noted. 3. Mild tracheobronchomalacia. 4. Scattered coronary artery calcifications seen. 5. Minimal nonspecific soft tissue inflammation about the gallbladder. Electronically Signed   By: Garald Balding M.D.   On: 02/09/2017 22:38   Dg Chest Port 1 View  Result Date: 02/08/2017 CLINICAL DATA:  COPD.  Fall with weakness.  Atrial fibrillation. EXAM: PORTABLE CHEST 1 VIEW COMPARISON:  02/05/2017. FINDINGS: The heart is enlarged. Subsegmental atelectatic change the bases without definite consolidation or frank edema. Small BILATERAL effusions are likely. There is no pneumothorax. IMPRESSION: Similar appearance to priors. Cardiomegaly. Bibasilar subsegmental atelectasis. Electronically Signed   By: Staci Righter M.D.   On: 02/08/2017 08:24   Dg Chest Port 1 View  Result Date: 01/13/2017 CLINICAL DATA:  Mental status changes. EXAM: PORTABLE CHEST 1 VIEW COMPARISON:  12/17/2016 FINDINGS: 1712 hours. Low lung volumes. There is  pulmonary vascular congestion without overt pulmonary edema. The lungs are clear without focal pneumonia, edema, pneumothorax or pleural effusion. Similar appearance atelectasis at the left base. The cardio pericardial silhouette is enlarged. The visualized bony structures of the thorax are intact. Telemetry leads overlie the chest. IMPRESSION: 1. Low volume film with cardiomegaly and vascular congestion. 2. Similar appearance left base atelectasis. Electronically Signed   By: Misty Stanley M.D.   On: 01/13/2017 17:40   Dg Hand Complete Right  Result Date: 02/05/2017 CLINICAL DATA:  Right wrist deformity after fall today. EXAM: RIGHT HAND - COMPLETE 3+ VIEW COMPARISON:  Radiograph of January 15, 2017. FINDINGS: Continued presence of moderately displaced fracture involving distal portion of first metacarpal. Stable widening of scapholunate space is noted. No new fracture or dislocation is noted. IMPRESSION: Continued presence of moderately displaced distal first metacarpal fracture. Stable widening of scapholunate space is noted suggesting ligamentous injury. No new abnormality is noted. Electronically Signed   By: Marijo Conception, M.D.   On: 02/05/2017 10:55   Dg Hand Complete Right  Result Date: 01/15/2017 CLINICAL DATA:  Fall several days ago. EXAM: RIGHT HAND - COMPLETE 3+ VIEW COMPARISON:  12/27/2016 FINDINGS: Deformity in the distal radius and ulna from old injury. Angulated fracture through the right first metacarpal again noted with increasing angulation. No new fracture. Evidence of scapholunate dissociation with widening, stable. IMPRESSION: Increasing angulation at the distal right first metacarpal fracture. stable scapholunate dissociation. Electronically Signed   By: Rolm Baptise M.D.   On: 01/15/2017 13:21    Microbiology: Recent Results (from the past 240 hour(s))  Respiratory Panel by PCR     Status: None   Collection Time: 02/05/17  1:00 PM  Result Value Ref Range Status   Adenovirus  NOT DETECTED NOT DETECTED Final   Coronavirus 229E NOT DETECTED NOT DETECTED Final   Coronavirus HKU1 NOT DETECTED NOT DETECTED Final   Coronavirus NL63 NOT DETECTED NOT DETECTED Final   Coronavirus OC43 NOT DETECTED NOT DETECTED Final   Metapneumovirus NOT DETECTED NOT DETECTED Final   Rhinovirus / Enterovirus NOT DETECTED NOT DETECTED Final   Influenza A NOT DETECTED NOT DETECTED Final   Influenza B NOT DETECTED NOT DETECTED Final   Parainfluenza Virus 1 NOT DETECTED NOT DETECTED Final   Parainfluenza Virus 2 NOT DETECTED NOT DETECTED Final   Parainfluenza Virus 3 NOT DETECTED NOT DETECTED Final   Parainfluenza Virus 4 NOT DETECTED NOT DETECTED Final   Respiratory Syncytial Virus NOT DETECTED NOT DETECTED Final   Bordetella pertussis NOT DETECTED NOT DETECTED  Final   Chlamydophila pneumoniae NOT DETECTED NOT DETECTED Final   Mycoplasma pneumoniae NOT DETECTED NOT DETECTED Final  MRSA PCR Screening     Status: None   Collection Time: 02/05/17  1:00 PM  Result Value Ref Range Status   MRSA by PCR NEGATIVE NEGATIVE Final    Comment:        The GeneXpert MRSA Assay (FDA approved for NASAL specimens only), is one component of a comprehensive MRSA colonization surveillance program. It is not intended to diagnose MRSA infection nor to guide or monitor treatment for MRSA infections.      Labs: Basic Metabolic Panel: Recent Labs  Lab 02/06/17 0238 02/07/17 0246 02/07/17 0748 02/08/17 0304 02/09/17 0211 02/10/17 0250  NA 144 141  --  141 142 139  K 4.2 5.5* 4.5 3.9 3.7 3.5  CL 112* 111  --  111 108 97*  CO2 28 24  --  24 29 33*  GLUCOSE 100* 158*  --  184* 162* 131*  BUN 6 15  --  17 14 12   CREATININE 0.98 0.91  --  0.94 0.90 0.72  CALCIUM 8.1* 8.3*  --  8.3* 8.2* 8.2*  MG  --   --   --   --   --  2.1   Liver Function Tests: Recent Labs  Lab 02/05/17 0919 02/06/17 0238  AST 55* 46*  ALT 22 22  ALKPHOS 72 67  BILITOT 0.7 0.9  PROT 6.6 6.0*  ALBUMIN 3.2* 2.7*    No results for input(s): LIPASE, AMYLASE in the last 168 hours. Recent Labs  Lab 02/05/17 0919  AMMONIA 35   CBC: Recent Labs  Lab 02/05/17 0919 02/06/17 0238 02/07/17 0246 02/08/17 0304 02/09/17 0211 02/10/17 0250  WBC 7.8 7.4 10.2 15.1* 13.0* 10.4  NEUTROABS 4.6  --  9.1* 13.8* 12.0* 8.2*  HGB 13.9 13.2 13.2 12.5* 12.8* 13.8  HCT 44.4 44.2 41.7 40.8 41.3 43.4  MCV 85.2 87.4 85.8 86.6 85.2 83.5  PLT 243 246 223 252 248 254   Cardiac Enzymes: Recent Labs  Lab 02/05/17 0919 02/09/17 1808  CKTOTAL 2,175*  --   TROPONINI  --  <0.03   BNP: BNP (last 3 results) No results for input(s): BNP in the last 8760 hours.  ProBNP (last 3 results) No results for input(s): PROBNP in the last 8760 hours.  CBG: Recent Labs  Lab 02/05/17 0925  GLUCAP 98       Signed:  Alma Friendly, MD [facility] 02/11/2017, 12:02 AM

## 2017-02-14 NOTE — Care Management (Signed)
CM verified that pt has been accepted with [facility] for [facility] - CM contacted pt and pt has already been in contact with pt.

## 2017-02-16 ENCOUNTER — Other Ambulatory Visit: Payer: Self-pay | Admitting: Internal Medicine

## 2017-02-16 ENCOUNTER — Telehealth: Payer: Self-pay | Admitting: Internal Medicine

## 2017-02-16 NOTE — Telephone Encounter (Signed)
Patient states he needs medication alprazolam refilled and that Dr.Pyrtle refilled it last time. Patient states he still has severe diarrhea and has no bowel control. Patient wants to know when he should come in again or what he should do.

## 2017-02-16 NOTE — Telephone Encounter (Signed)
Error

## 2017-02-16 NOTE — Telephone Encounter (Signed)
Mobile number in system is incorrect. I did leave voicemail on home number to call back (no name identifier on home number). Dr Hilarie Fredrickson has never filled alprazolam so my suggestion would be for PCP to fill that if they find appropriate. Patient will also need to see Nevin Bloodgood or Dr Hilarie Fredrickson in office for symptoms as per Dr Vena Rua last note.

## 2017-02-16 NOTE — Telephone Encounter (Signed)
Pt states he is still having diarrhea. Offered him an appt for Tuesday but pt states he needs it later. Pt scheduled to see Tye Savoy NP 02/25/17@3pm . Pt aware of appt.

## 2017-02-16 NOTE — Telephone Encounter (Signed)
See telephone note from 02/16/17 also addressing this issue.

## 2017-02-20 ENCOUNTER — Emergency Department (HOSPITAL_COMMUNITY): Payer: Medicare Other

## 2017-02-20 ENCOUNTER — Other Ambulatory Visit: Payer: Self-pay

## 2017-02-20 ENCOUNTER — Encounter (HOSPITAL_COMMUNITY): Payer: Self-pay | Admitting: Emergency Medicine

## 2017-02-20 ENCOUNTER — Inpatient Hospital Stay (HOSPITAL_COMMUNITY)
Admission: EM | Admit: 2017-02-20 | Discharge: 2017-02-23 | DRG: 190 | Disposition: A | Discharge status: Home Health | Payer: Medicare Other | Attending: Internal Medicine | Admitting: Internal Medicine

## 2017-02-20 DIAGNOSIS — Y95 Nosocomial condition: Secondary | ICD-10-CM | POA: Diagnosis present

## 2017-02-20 DIAGNOSIS — F250 Schizoaffective disorder, bipolar type: Secondary | ICD-10-CM | POA: Diagnosis present

## 2017-02-20 DIAGNOSIS — D649 Anemia, unspecified: Secondary | ICD-10-CM | POA: Diagnosis present

## 2017-02-20 DIAGNOSIS — F1121 Opioid dependence, in remission: Secondary | ICD-10-CM | POA: Diagnosis present

## 2017-02-20 DIAGNOSIS — Y92009 Unspecified place in unspecified non-institutional (private) residence as the place of occurrence of the external cause: Secondary | ICD-10-CM

## 2017-02-20 DIAGNOSIS — Z882 Allergy status to sulfonamides status: Secondary | ICD-10-CM | POA: Diagnosis not present

## 2017-02-20 DIAGNOSIS — Z880 Allergy status to penicillin: Secondary | ICD-10-CM

## 2017-02-20 DIAGNOSIS — J441 Chronic obstructive pulmonary disease with (acute) exacerbation: Principal | ICD-10-CM | POA: Diagnosis present

## 2017-02-20 DIAGNOSIS — J440 Chronic obstructive pulmonary disease with acute lower respiratory infection: Secondary | ICD-10-CM | POA: Diagnosis present

## 2017-02-20 DIAGNOSIS — M3313 Other dermatomyositis without myopathy: Secondary | ICD-10-CM | POA: Diagnosis present

## 2017-02-20 DIAGNOSIS — Z8249 Family history of ischemic heart disease and other diseases of the circulatory system: Secondary | ICD-10-CM | POA: Diagnosis not present

## 2017-02-20 DIAGNOSIS — J9602 Acute respiratory failure with hypercapnia: Secondary | ICD-10-CM | POA: Diagnosis present

## 2017-02-20 DIAGNOSIS — R569 Unspecified convulsions: Secondary | ICD-10-CM | POA: Diagnosis present

## 2017-02-20 DIAGNOSIS — M797 Fibromyalgia: Secondary | ICD-10-CM | POA: Diagnosis present

## 2017-02-20 DIAGNOSIS — Z8673 Personal history of transient ischemic attack (TIA), and cerebral infarction without residual deficits: Secondary | ICD-10-CM | POA: Diagnosis not present

## 2017-02-20 DIAGNOSIS — J9601 Acute respiratory failure with hypoxia: Secondary | ICD-10-CM | POA: Diagnosis not present

## 2017-02-20 DIAGNOSIS — Z888 Allergy status to other drugs, medicaments and biological substances status: Secondary | ICD-10-CM

## 2017-02-20 DIAGNOSIS — F429 Obsessive-compulsive disorder, unspecified: Secondary | ICD-10-CM | POA: Diagnosis present

## 2017-02-20 DIAGNOSIS — J45901 Unspecified asthma with (acute) exacerbation: Secondary | ICD-10-CM | POA: Diagnosis present

## 2017-02-20 DIAGNOSIS — K219 Gastro-esophageal reflux disease without esophagitis: Secondary | ICD-10-CM | POA: Diagnosis present

## 2017-02-20 DIAGNOSIS — Z885 Allergy status to narcotic agent status: Secondary | ICD-10-CM | POA: Diagnosis not present

## 2017-02-20 DIAGNOSIS — W010XXA Fall on same level from slipping, tripping and stumbling without subsequent striking against object, initial encounter: Secondary | ICD-10-CM | POA: Diagnosis present

## 2017-02-20 DIAGNOSIS — I252 Old myocardial infarction: Secondary | ICD-10-CM

## 2017-02-20 DIAGNOSIS — J189 Pneumonia, unspecified organism: Secondary | ICD-10-CM | POA: Diagnosis present

## 2017-02-20 DIAGNOSIS — R296 Repeated falls: Secondary | ICD-10-CM | POA: Diagnosis present

## 2017-02-20 DIAGNOSIS — Z9842 Cataract extraction status, left eye: Secondary | ICD-10-CM

## 2017-02-20 DIAGNOSIS — G9340 Encephalopathy, unspecified: Secondary | ICD-10-CM | POA: Diagnosis present

## 2017-02-20 DIAGNOSIS — E119 Type 2 diabetes mellitus without complications: Secondary | ICD-10-CM | POA: Diagnosis present

## 2017-02-20 DIAGNOSIS — G9341 Metabolic encephalopathy: Secondary | ICD-10-CM | POA: Diagnosis present

## 2017-02-20 DIAGNOSIS — Z9119 Patient's noncompliance with other medical treatment and regimen: Secondary | ICD-10-CM

## 2017-02-20 DIAGNOSIS — I480 Paroxysmal atrial fibrillation: Secondary | ICD-10-CM | POA: Diagnosis present

## 2017-02-20 DIAGNOSIS — I10 Essential (primary) hypertension: Secondary | ICD-10-CM | POA: Diagnosis present

## 2017-02-20 DIAGNOSIS — Z79899 Other long term (current) drug therapy: Secondary | ICD-10-CM

## 2017-02-20 DIAGNOSIS — M069 Rheumatoid arthritis, unspecified: Secondary | ICD-10-CM | POA: Diagnosis present

## 2017-02-20 DIAGNOSIS — Z961 Presence of intraocular lens: Secondary | ICD-10-CM | POA: Diagnosis present

## 2017-02-20 DIAGNOSIS — Z87891 Personal history of nicotine dependence: Secondary | ICD-10-CM | POA: Diagnosis not present

## 2017-02-20 DIAGNOSIS — Z7982 Long term (current) use of aspirin: Secondary | ICD-10-CM

## 2017-02-20 DIAGNOSIS — Z823 Family history of stroke: Secondary | ICD-10-CM

## 2017-02-20 DIAGNOSIS — E785 Hyperlipidemia, unspecified: Secondary | ICD-10-CM | POA: Diagnosis present

## 2017-02-20 DIAGNOSIS — Z7951 Long term (current) use of inhaled steroids: Secondary | ICD-10-CM

## 2017-02-20 DIAGNOSIS — J449 Chronic obstructive pulmonary disease, unspecified: Secondary | ICD-10-CM | POA: Diagnosis present

## 2017-02-20 DIAGNOSIS — I2721 Secondary pulmonary arterial hypertension: Secondary | ICD-10-CM | POA: Diagnosis present

## 2017-02-20 DIAGNOSIS — I959 Hypotension, unspecified: Secondary | ICD-10-CM | POA: Diagnosis present

## 2017-02-20 DIAGNOSIS — Z887 Allergy status to serum and vaccine status: Secondary | ICD-10-CM

## 2017-02-20 LAB — I-STAT ARTERIAL BLOOD GAS, ED
ACID-BASE DEFICIT: 2 mmol/L (ref 0.0–2.0)
Acid-base deficit: 2 mmol/L (ref 0.0–2.0)
Bicarbonate: 26.7 mmol/L (ref 20.0–28.0)
Bicarbonate: 24.4 mmol/L (ref 20.0–28.0)
Bicarbonate: 24.9 mmol/L (ref 20.0–28.0)
O2 SAT: 97 %
O2 SAT: 93 %
O2 Saturation: 93 %
PCO2 ART: 49.7 mmHg — AB (ref 32.0–48.0)
PH ART: 7.296 — AB (ref 7.350–7.450)
PO2 ART: 95 mmHg (ref 83.0–108.0)
PO2 ART: 75 mmHg — AB (ref 83.0–108.0)
Patient temperature: 98.3
Patient temperature: 98.3
TCO2: 28 mmol/L (ref 22–32)
TCO2: 26 mmol/L (ref 22–32)
TCO2: 26 mmol/L (ref 22–32)
pCO2 arterial: 50 mmHg — ABNORMAL HIGH (ref 32.0–48.0)
pCO2 arterial: 50.9 mmHg — ABNORMAL HIGH (ref 32.0–48.0)
pH, Arterial: 7.338 — ABNORMAL LOW (ref 7.350–7.450)
pH, Arterial: 7.298 — ABNORMAL LOW (ref 7.350–7.450)
pO2, Arterial: 73 mmHg — ABNORMAL LOW (ref 83.0–108.0)

## 2017-02-20 LAB — COMPREHENSIVE METABOLIC PANEL
ALT: 18 U/L (ref 17–63)
ANION GAP: 6 (ref 5–15)
AST: 20 U/L (ref 15–41)
Albumin: 2.9 g/dL — ABNORMAL LOW (ref 3.5–5.0)
Alkaline Phosphatase: 73 U/L (ref 38–126)
BILIRUBIN TOTAL: 0.8 mg/dL (ref 0.3–1.2)
BUN: 9 mg/dL (ref 6–20)
CO2: 26 mmol/L (ref 22–32)
Calcium: 8.4 mg/dL — ABNORMAL LOW (ref 8.9–10.3)
Chloride: 108 mmol/L (ref 101–111)
Creatinine, Ser: 1.3 mg/dL — ABNORMAL HIGH (ref 0.61–1.24)
GFR, EST NON AFRICAN AMERICAN: 55 mL/min — AB (ref >60)
Glucose, Bld: 108 mg/dL — ABNORMAL HIGH (ref 65–99)
POTASSIUM: 3.8 mmol/L (ref 3.5–5.1)
Sodium: 140 mmol/L (ref 135–145)
TOTAL PROTEIN: 5.8 g/dL — AB (ref 6.5–8.1)

## 2017-02-20 LAB — CBC WITH DIFFERENTIAL/PLATELET
BASOS ABS: 0 10*3/uL (ref 0.0–0.1)
Basophils Relative: 0 %
Eosinophils Absolute: 0.1 10*3/uL (ref 0.0–0.7)
Eosinophils Relative: 2 %
HCT: 40.9 % (ref 39.0–52.0)
HEMOGLOBIN: 12.4 g/dL — AB (ref 13.0–17.0)
LYMPHS ABS: 2.4 10*3/uL (ref 0.7–4.0)
Lymphocytes Relative: 36 %
MCH: 25.9 pg — ABNORMAL LOW (ref 26.0–34.0)
MCHC: 30.3 g/dL (ref 30.0–36.0)
MCV: 85.4 fL (ref 78.0–100.0)
Monocytes Absolute: 0.9 10*3/uL (ref 0.1–1.0)
Monocytes Relative: 14 %
NEUTROS PCT: 48 %
Neutro Abs: 3.1 10*3/uL (ref 1.7–7.7)
PLATELETS: 239 10*3/uL (ref 150–400)
RBC: 4.79 MIL/uL (ref 4.22–5.81)
RDW: 19.6 % — ABNORMAL HIGH (ref 11.5–15.5)
WBC: 6.5 10*3/uL (ref 4.0–10.5)

## 2017-02-20 LAB — CBC
HCT: 41.5 % (ref 39.0–52.0)
Hemoglobin: 12.4 g/dL — ABNORMAL LOW (ref 13.0–17.0)
MCH: 25.7 pg — ABNORMAL LOW (ref 26.0–34.0)
MCHC: 29.9 g/dL — AB (ref 30.0–36.0)
MCV: 86.1 fL (ref 78.0–100.0)
PLATELETS: 245 10*3/uL (ref 150–400)
RBC: 4.82 MIL/uL (ref 4.22–5.81)
RDW: 19.8 % — AB (ref 11.5–15.5)
WBC: 4 10*3/uL (ref 4.0–10.5)

## 2017-02-20 LAB — RAPID URINE DRUG SCREEN, HOSP PERFORMED
Amphetamines: NOT DETECTED
Barbiturates: POSITIVE — AB
Benzodiazepines: POSITIVE — AB
COCAINE: NOT DETECTED
OPIATES: NOT DETECTED
TETRAHYDROCANNABINOL: NOT DETECTED

## 2017-02-20 LAB — LACTIC ACID, PLASMA: Lactic Acid, Venous: 0.8 mmol/L (ref 0.5–1.9)

## 2017-02-20 LAB — TROPONIN I: Troponin I: <0.03 ng/mL (ref <0.03)

## 2017-02-20 LAB — PROCALCITONIN

## 2017-02-20 LAB — URINALYSIS, ROUTINE W REFLEX MICROSCOPIC
Bilirubin Urine: NEGATIVE
GLUCOSE, UA: NEGATIVE mg/dL
HGB URINE DIPSTICK: NEGATIVE
Ketones, ur: NEGATIVE mg/dL
LEUKOCYTES UA: NEGATIVE
Nitrite: NEGATIVE
Protein, ur: NEGATIVE mg/dL
SPECIFIC GRAVITY, URINE: 1.019 (ref 1.005–1.030)
pH: 5 (ref 5.0–8.0)

## 2017-02-20 LAB — ETHANOL: Alcohol, Ethyl (B): <10 mg/dL (ref <10)

## 2017-02-20 LAB — CREATININE, SERUM
CREATININE: 1.51 mg/dL — AB (ref 0.61–1.24)
GFR calc Af Amer: 53 mL/min — ABNORMAL LOW (ref >60)
GFR, EST NON AFRICAN AMERICAN: 46 mL/min — AB (ref >60)

## 2017-02-20 LAB — I-STAT CG4 LACTIC ACID, ED
LACTIC ACID, VENOUS: 2.05 mmol/L — AB (ref 0.5–1.9)
LACTIC ACID, VENOUS: 1.04 mmol/L (ref 0.5–1.9)

## 2017-02-20 LAB — SALICYLATE LEVEL: Salicylate Lvl: <7 mg/dL (ref 2.8–30.0)

## 2017-02-20 LAB — TSH: TSH: 0.394 u[IU]/mL (ref 0.350–4.500)

## 2017-02-20 LAB — AMMONIA: AMMONIA: 39 umol/L — AB (ref 9–35)

## 2017-02-20 LAB — VALPROIC ACID LEVEL: Valproic Acid Lvl: 72 ug/mL (ref 50.0–100.0)

## 2017-02-20 LAB — CK: Total CK: 134 U/L (ref 49–397)

## 2017-02-20 MED ORDER — VANCOMYCIN HCL 10 G IV SOLR
1250.0000 mg | INTRAVENOUS | Status: DC
  Filled 2017-02-20: qty 1250

## 2017-02-20 MED ORDER — LACTULOSE 10 GM/15ML PO SOLN
30.0000 g | Freq: Once | ORAL | Status: AC
  Administered 2017-02-20: 30 g via ORAL
  Filled 2017-02-20: qty 45

## 2017-02-20 MED ORDER — IPRATROPIUM-ALBUTEROL 0.5-2.5 (3) MG/3ML IN SOLN
3.0000 mL | RESPIRATORY_TRACT | Status: DC | PRN
  Filled 2017-02-20 (×2): qty 3

## 2017-02-20 MED ORDER — VANCOMYCIN HCL IN DEXTROSE 1-5 GM/200ML-% IV SOLN: 1000.0000 mg | Freq: Once | INTRAVENOUS | Status: DC

## 2017-02-20 MED ORDER — IPRATROPIUM BROMIDE 0.02 % IN SOLN
0.5000 mg | Freq: Once | RESPIRATORY_TRACT | Status: AC
  Administered 2017-02-20: 0.5 mg via RESPIRATORY_TRACT
  Filled 2017-02-20: qty 2.5

## 2017-02-20 MED ORDER — PANTOPRAZOLE SODIUM 40 MG PO TBEC
40.0000 mg | DELAYED_RELEASE_TABLET | Freq: Every day | ORAL | Status: DC
  Administered 2017-02-21 – 2017-02-23 (×3): 40 mg via ORAL
  Filled 2017-02-20 (×3): qty 1

## 2017-02-20 MED ORDER — ENOXAPARIN SODIUM 40 MG/0.4ML ~~LOC~~ SOLN
40.0000 mg | SUBCUTANEOUS | Status: DC
  Administered 2017-02-20 – 2017-02-22 (×3): 40 mg via SUBCUTANEOUS
  Filled 2017-02-20 (×3): qty 0.4

## 2017-02-20 MED ORDER — IPRATROPIUM-ALBUTEROL 0.5-2.5 (3) MG/3ML IN SOLN
3.0000 mL | RESPIRATORY_TRACT | Status: DC
  Administered 2017-02-20 – 2017-02-21 (×3): 3 mL via RESPIRATORY_TRACT
  Filled 2017-02-20 (×2): qty 3

## 2017-02-20 MED ORDER — ONDANSETRON HCL 4 MG/2ML IJ SOLN: 4.0000 mg | Freq: Four times a day (QID) | INTRAMUSCULAR | Status: DC | PRN

## 2017-02-20 MED ORDER — SODIUM CHLORIDE 0.9 % IV BOLUS (SEPSIS)
1000.0000 mL | Freq: Once | INTRAVENOUS | Status: AC
  Administered 2017-02-20: 1000 mL via INTRAVENOUS

## 2017-02-20 MED ORDER — NALOXONE HCL 2 MG/2ML IJ SOSY: 2.0000 mg | PREFILLED_SYRINGE | Freq: Once | INTRAMUSCULAR | Status: DC

## 2017-02-20 MED ORDER — OXYBUTYNIN CHLORIDE 5 MG PO TABS
5.0000 mg | ORAL_TABLET | Freq: Every day | ORAL | Status: DC
  Administered 2017-02-21 – 2017-02-23 (×3): 5 mg via ORAL
  Filled 2017-02-20 (×3): qty 1

## 2017-02-20 MED ORDER — DEXTROSE 5 % IV SOLN
1.0000 g | Freq: Three times a day (TID) | INTRAVENOUS | Status: DC
  Administered 2017-02-20 – 2017-02-23 (×8): 1 g via INTRAVENOUS
  Filled 2017-02-20 (×9): qty 1

## 2017-02-20 MED ORDER — ASPIRIN EC 81 MG PO TBEC
81.0000 mg | DELAYED_RELEASE_TABLET | Freq: Every day | ORAL | Status: DC
  Administered 2017-02-21 – 2017-02-23 (×3): 81 mg via ORAL
  Filled 2017-02-20 (×3): qty 1

## 2017-02-20 MED ORDER — ATORVASTATIN CALCIUM 40 MG PO TABS
40.0000 mg | ORAL_TABLET | Freq: Every day | ORAL | Status: DC
  Administered 2017-02-21 – 2017-02-23 (×3): 40 mg via ORAL
  Filled 2017-02-20 (×3): qty 1

## 2017-02-20 MED ORDER — ALBUTEROL SULFATE (2.5 MG/3ML) 0.083% IN NEBU
INHALATION_SOLUTION | RESPIRATORY_TRACT | Status: AC
  Administered 2017-02-20: 5 mg via RESPIRATORY_TRACT
  Filled 2017-02-20: qty 3

## 2017-02-20 MED ORDER — ACETAMINOPHEN 325 MG PO TABS: 650.0000 mg | ORAL_TABLET | Freq: Four times a day (QID) | ORAL | Status: DC | PRN

## 2017-02-20 MED ORDER — ALBUTEROL SULFATE (2.5 MG/3ML) 0.083% IN NEBU
5.0000 mg | INHALATION_SOLUTION | Freq: Once | RESPIRATORY_TRACT | Status: AC
  Administered 2017-02-20: 5 mg via RESPIRATORY_TRACT
  Filled 2017-02-20: qty 6

## 2017-02-20 MED ORDER — ACETAMINOPHEN 650 MG RE SUPP: 650.0000 mg | Freq: Four times a day (QID) | RECTAL | Status: DC | PRN

## 2017-02-20 MED ORDER — VANCOMYCIN HCL 10 G IV SOLR
2000.0000 mg | Freq: Once | INTRAVENOUS | Status: AC
  Administered 2017-02-20: 2000 mg via INTRAVENOUS
  Filled 2017-02-20: qty 2000

## 2017-02-20 MED ORDER — BUDESONIDE 0.25 MG/2ML IN SUSP
0.2500 mg | Freq: Two times a day (BID) | RESPIRATORY_TRACT | Status: DC
  Administered 2017-02-20 – 2017-02-23 (×5): 0.25 mg via RESPIRATORY_TRACT
  Filled 2017-02-20 (×6): qty 2

## 2017-02-20 MED ORDER — METHYLPREDNISOLONE SODIUM SUCC 125 MG IJ SOLR
125.0000 mg | Freq: Once | INTRAMUSCULAR | Status: AC
  Administered 2017-02-20: 125 mg via INTRAVENOUS
  Filled 2017-02-20: qty 2

## 2017-02-20 MED ORDER — DEXTROSE 5 % IV SOLN
2.0000 g | Freq: Once | INTRAVENOUS | Status: AC
  Administered 2017-02-20: 2 g via INTRAVENOUS
  Filled 2017-02-20: qty 2

## 2017-02-20 MED ORDER — AMLODIPINE BESYLATE 5 MG PO TABS
5.0000 mg | ORAL_TABLET | Freq: Every day | ORAL | Status: DC
  Administered 2017-02-21 – 2017-02-23 (×3): 5 mg via ORAL
  Filled 2017-02-20 (×3): qty 1

## 2017-02-20 MED ORDER — SODIUM CHLORIDE 0.9 % IV BOLUS (SEPSIS): 500.0000 mL | Freq: Once | INTRAVENOUS | Status: DC

## 2017-02-20 MED ORDER — LEVETIRACETAM 500 MG PO TABS
500.0000 mg | ORAL_TABLET | Freq: Two times a day (BID) | ORAL | Status: DC
  Administered 2017-02-20 – 2017-02-23 (×6): 500 mg via ORAL
  Filled 2017-02-20 (×6): qty 1

## 2017-02-20 MED ORDER — DIVALPROEX SODIUM ER 500 MG PO TB24
750.0000 mg | ORAL_TABLET | Freq: Every day | ORAL | Status: DC
  Administered 2017-02-21 – 2017-02-23 (×3): 750 mg via ORAL
  Filled 2017-02-20 (×3): qty 1

## 2017-02-20 MED ORDER — NALOXONE HCL 2 MG/2ML IJ SOSY
1.0000 mg | PREFILLED_SYRINGE | Freq: Once | INTRAMUSCULAR | Status: AC
  Administered 2017-02-20: 1 mg via INTRAVENOUS
  Filled 2017-02-20: qty 2

## 2017-02-20 MED ORDER — NALOXONE HCL 0.4 MG/ML IJ SOLN: 0.4000 mg | Freq: Once | INTRAMUSCULAR | Status: DC

## 2017-02-20 MED ORDER — AMIODARONE HCL 200 MG PO TABS
200.0000 mg | ORAL_TABLET | Freq: Every day | ORAL | Status: DC
  Administered 2017-02-21 – 2017-02-22 (×2): 200 mg via ORAL
  Filled 2017-02-20 (×2): qty 1

## 2017-02-20 MED ORDER — ONDANSETRON HCL 4 MG PO TABS: 4.0000 mg | ORAL_TABLET | Freq: Four times a day (QID) | ORAL | Status: DC | PRN

## 2017-02-20 MED ORDER — SODIUM CHLORIDE 0.9 % IV BOLUS (SEPSIS): 1000.0000 mL | Freq: Once | INTRAVENOUS | Status: DC

## 2017-02-20 NOTE — Progress Notes (Signed)
Patient transported on BiPAP to CT and back. Vitals stable. 

## 2017-02-20 NOTE — H&P (Signed)
History and Physical    JEREMI LOSITO NAT:[id number] DOB: [birth year] DOA: 02/20/2017  PCP: [facility]  Patient coming from: Home.  Chief Complaint: Fall.  HPI: Patrick Holt is a 67 y.o. male with history of COPD, hypertension, bipolar disorder, seizure, dermatomyositis, paroxysmal atrial fibrillation, previous stroke was brought to the ER after patient had a fall at home and was unable to get up from the fall.  Patient states he fell yesterday and was unable to communicate until he reached the phone today and called EMS.  Patient states he tripped and fell and did not hit his head or lose consciousness.  Denies any chest pain or shortness of breath.  EMS found that patient was mildly drowsy.  Not answering properly. ED Course: In the ER since patient was drowsy ABG was done which initially showed pH of 7.3 with PCO2 of 49.7 which worsened to 7.2 with PCO2 of 50.  Patient was placed on BiPAP.  Patient clinically improved though pH did not improve.  CT head was unremarkable.  Chest x-ray shows possible pneumonia for which patient was placed on antibiotics and since patient also was mildly wheezing nebulizer treatment was started.  On my exam patient is drowsy but answering questions appropriately and was able to give good history.  Denies any chest pain nausea vomiting abdominal pain diarrhea.  Denies any weakness of the extremities.  Review of Systems: As per HPI, rest all negative.   Past Medical History:  Diagnosis Date  . Anemia   . Anxiety   . Asthma   . Atrial fibrillation ([place]) 01/2012   with RVR  . Bipolar 1 disorder ([place])   . Cancer ([place])   . Collagen vascular disease ([place])   . Conversion disorder 06/2010  . COPD (chronic obstructive pulmonary disease) ([place])   . Dermatomyositis ([place])   . Dermatomyositis ([place])   . Diverticulitis   . Diverticulosis of colon   . Dysrhythmia    "irregular" (11/15/2012)  . Esophageal dysmotility   . Esophageal  stricture   . Fibromyalgia   . Gastritis   . GERD (gastroesophageal reflux disease)   . Hand fracture, right    sept 13, 2017, still wearing splint as og 01-10-2017  . Headache(784.0)    "severe; get them often" (11/15/2012)  . Hiatal hernia   . History of narcotic addiction ([place])   . Hx of adenomatous colonic polyps   . Hyperlipidemia   . Hypertension   . Internal hemorrhoids   . Ischemic heart disease   . Major depression    with acute psychotic break in 06/2010  . Myocardial infarct ([place])    mulitple (1999, 2000, 2004)  . Narcotic dependence ([place])   . Nephrolithiasis   . Obesity   . OCD (obsessive compulsive disorder)   . Otosclerosis   . Paroxysmal A-fib ([place])   . Peripheral neuropathy   . Raynaud's disease   . Renal insufficiency   . Rheumatoid arthritis(714.0)   . Sarcoidosis   . Seizures ([place])   . Subarachnoid hemorrhage ([place]) 01/2012   with subdural  hematoma.   . Type II diabetes mellitus ([place])   . Urge incontinence   . Vertigo     Past Surgical History:  Procedure Laterality Date  . BACK SURGERY    . CARDIAC CATHETERIZATION    . CARPAL TUNNEL RELEASE Bilateral   . CATARACT EXTRACTION W/ INTRAOCULAR LENS IMPLANT Left   . COLONOSCOPY N/A 09/27/2012   Procedure: COLONOSCOPY;  Surgeon: Lafayette Dragon, MD;  Location: [facility];  Service: Endoscopy;  Laterality: N/A;  . ESOPHAGOGASTRODUODENOSCOPY N/A 09/27/2012   Procedure: ESOPHAGOGASTRODUODENOSCOPY (EGD);  Surgeon: Lafayette Dragon, MD;  Location: [facility];  Service: Endoscopy;  Laterality: N/A;  . KNEE ARTHROSCOPY W/ MENISCAL REPAIR Left 2009  . LUMBAR DISC SURGERY    . LYMPH NODE DISSECTION Right 1970's   "neck; dr thought I had Hodgkins; turned out to be sarcoidosis" (11/15/2012)  . squamous papilloma   2010   removed by Dr. Constance Holster ENT, noted on tongue  . TONSILLECTOMY       reports that he quit smoking about 2 years ago. His smoking use included cigarettes. He has a 15.00 pack-year smoking history. he  has never used smokeless tobacco. He reports that he does not drink alcohol or use drugs.  Allergies  Allergen Reactions  . Immune Globulins Other (See Comments)    Acute renal failure  . Ciprofloxacin Swelling  . Flagyl [Metronidazole] Swelling  . Lisinopril Diarrhea  . Sulfa Antibiotics Other (See Comments)    blisters  . Morphine And Related     Does not help with pain  . Requip [Ropinirole Hcl]     constipation  . Betamethasone Dipropionate Other (See Comments)    Unknown  . Bupropion Hcl Other (See Comments)    Unknown  . Clobetasol Other (See Comments)    Unknown  . Codeine Other (See Comments)    Does not help with pain  . Escitalopram Oxalate Other (See Comments)    Unknown  . Fluoxetine Hcl Other (See Comments)    Unknown  . Furosemide Other (See Comments)    Unknown  . Meclizine Rash  . Paroxetine Other (See Comments)    Unknown  . Penicillins Other (See Comments)    Unknown reaction Has patient had a PCN reaction causing immediate rash, facial/tongue/throat swelling, SOB or lightheadedness with hypotension: unknown Has patient had a PCN reaction causing severe rash involving mucus membranes or skin necrosis: unknown Has patient had a PCN reaction that required hospitalization unknown Has patient had a PCN reaction occurring within the last 10 years: unknown If all of the above answers are "NO", then may proceed with Cephalosporin use. Unknown Unknown reactio  . Tacrolimus Other (See Comments)    Unknown  . Tetanus Toxoid Other (See Comments)    Unknown  . Tuberculin Purified Protein Derivative Other (See Comments)    Unknown    Family History  Problem Relation Age of Onset  . Alcohol abuse Mother   . Depression Mother   . Heart disease Mother   . Diabetes Mother   . Stroke Mother   . Coronary artery disease Father   . Diabetes Other        1/2 brother  . Hepatitis Brother     Prior to Admission medications   Medication Sig Start Date End Date  Taking? Authorizing Provider  ALPRAZolam Duanne Moron) 1 MG tablet Take 1 mg by mouth 3 (three) times daily. 01/10/17   [provider]  amiodarone (PACERONE) 200 MG tablet Take 1 tablet (200 mg total) by mouth daily. 02/10/17   Alma Friendly, MD  amLODipine (NORVASC) 5 MG tablet Take 1 tablet (5 mg total) by mouth daily. 01/19/17   Velvet Bathe, MD  aspirin EC 81 MG tablet Take 1 tablet (81 mg total) by mouth daily. 10/17/15   [name], MD  atorvastatin (LIPITOR) 40 MG tablet Take 1 tablet (40 mg total) by  mouth daily. 01/19/17   Velvet Bathe, MD  budesonide-formoterol [facility]) 160-4.5 MCG/ACT inhaler Inhale 2 puffs into the lungs 2 (two) times daily. 01/19/17   Velvet Bathe, MD  divalproex (DEPAKOTE ER) 250 MG 24 hr tablet Take 3 tablets (750 mg total) by mouth daily. 01/19/17   Velvet Bathe, MD  ergocalciferol (VITAMIN D2) 50000 units capsule Take 50,000 Units by mouth once a week.    [provider]  esomeprazole (NEXIUM) 40 MG capsule Take 40 mg by mouth daily. 07/26/16   [provider]  ipratropium-albuterol (DUONEB) 0.5-2.5 (3) MG/3ML SOLN Inhale 3 mLs into the lungs every 6 (six) hours as needed. Patient taking differently: Inhale 3 mLs into the lungs every 6 (six) hours as needed (for breathing).  11/17/15   Elgergawy, Silver Huguenin, MD  levETIRAcetam (KEPPRA) 500 MG tablet Take 500 mg by mouth 2 (two) times daily.    [provider]  loperamide (IMODIUM) 2 MG capsule Take 2 mg by mouth as needed for diarrhea or loose stools.    [provider]  Multiple Vitamin (MULTIVITAMIN WITH MINERALS) TABS tablet Take 1 tablet by mouth daily. 10/17/15   [name], MD  oxybutynin (DITROPAN) 5 MG tablet Take 5 mg by mouth daily. 07/26/16   [provider]  traMADol (ULTRAM) 50 MG tablet Take 50 mg by mouth every 6 (six) hours as needed for moderate pain.    [provider]    Physical Exam: Vitals:   02/20/17 2008 02/20/17 2020  02/20/17 2044 02/20/17 2049  BP:  124/77    Pulse: 70 65    Resp: (!) 21 16    Temp:  98 F (36.7 C)    TempSrc:  Oral    SpO2: 96% 100% 98% 95%  Weight:  107 kg (235 lb 14.3 oz)    Height:  5\' 7"  (1.702 m)        Constitutional: Moderately built and nourished. Vitals:   02/20/17 2008 02/20/17 2020 02/20/17 2044 02/20/17 2049  BP:  124/77    Pulse: 70 65    Resp: (!) 21 16    Temp:  98 F (36.7 C)    TempSrc:  Oral    SpO2: 96% 100% 98% 95%  Weight:  107 kg (235 lb 14.3 oz)    Height:  5\' 7"  (1.702 m)     Eyes: Anicteric no pallor. ENMT: No discharge from the ears eyes nose or mouth. Neck: No neck rigidity no mass felt. Respiratory: No rhonchi or crepitations. Cardiovascular: S1-S2 heard no murmurs appreciated. Abdomen: Soft nontender bowel sounds present. Musculoskeletal: No edema.  No joint effusion.  Has chronic right arm pain. Skin: No obvious rash.  No ecchymosis seen. Neurologic: Patient is mildly drowsy but answers questions appropriately and gives a good history and is oriented to place person and time and moves all extremities. Psychiatric: Mildly lethargic but otherwise oriented.   Labs on Admission: I have personally reviewed following labs and imaging studies  CBC: Recent Labs  Lab 02/20/17 1211  WBC 6.5  NEUTROABS 3.1  HGB 12.4*  HCT 40.9  MCV 85.4  PLT 188   Basic Metabolic Panel: Recent Labs  Lab 02/20/17 1211  NA 140  K 3.8  CL 108  CO2 26  GLUCOSE 108*  BUN 9  CREATININE 1.30*  CALCIUM 8.4*   GFR: Estimated Creatinine Clearance: 64.3 mL/min (A) (by C-G formula based on SCr of 1.3 mg/dL (H)). Liver Function Tests: Recent Labs  Lab 02/20/17  1211  AST 20  ALT 18  ALKPHOS 73  BILITOT 0.8  PROT 5.8*  ALBUMIN 2.9*   No results for input(s): LIPASE, AMYLASE in the last 168 hours. Recent Labs  Lab 02/20/17 1158  AMMONIA 39*   Coagulation Profile: No results for input(s): INR, PROTIME in the last 168 hours. Cardiac  Enzymes: No results for input(s): CKTOTAL, CKMB, CKMBINDEX, TROPONINI in the last 168 hours. BNP (last 3 results) No results for input(s): PROBNP in the last 8760 hours. HbA1C: No results for input(s): HGBA1C in the last 72 hours. CBG: No results for input(s): GLUCAP in the last 168 hours. Lipid Profile: No results for input(s): CHOL, HDL, LDLCALC, TRIG, CHOLHDL, LDLDIRECT in the last 72 hours. Thyroid Function Tests: No results for input(s): TSH, T4TOTAL, FREET4, T3FREE, THYROIDAB in the last 72 hours. Anemia Panel: No results for input(s): VITAMINB12, FOLATE, FERRITIN, TIBC, IRON, RETICCTPCT in the last 72 hours. Urine analysis:    Component Value Date/Time   COLORURINE YELLOW Sep 09, 202019 1153   APPEARANCEUR CLEAR Sep 09, 202019 1153   LABSPEC 1.019 Sep 09, 202019 1153   PHURINE 5.0 Sep 09, 202019 1153   GLUCOSEU NEGATIVE Sep 09, 202019 1153   HGBUR NEGATIVE Sep 09, 202019 1153   HGBUR negative 12/11/2009 1356   BILIRUBINUR NEGATIVE Sep 09, 202019 1153   BILIRUBINUR MODERATE 06/05/2015 1407   KETONESUR NEGATIVE Sep 09, 202019 1153   PROTEINUR NEGATIVE Sep 09, 202019 1153   UROBILINOGEN 2.0 06/05/2015 1407   UROBILINOGEN 1.0 12/30/2014 1830   NITRITE NEGATIVE Sep 09, 202019 1153   LEUKOCYTESUR NEGATIVE Sep 09, 202019 1153   Sepsis Labs: @LABRCNTIP (procalcitonin:4,lacticidven:4) )No results found for this or any previous visit (from the past 240 hour(s)).   Radiological Exams on Admission: Ct Head Wo Contrast  Result Date: 02/20/2017 CLINICAL DATA:  Altered mental status, fell this morning, denies striking head, became hypotensive normal larger and disoriented after he was felt to a sitting position, history atrial fibrillation, prior subarachnoid hemorrhage, collagen vascular disease COPD sites, GERD hypertension, ischemic heart disease post MI, type II diabetes mellitus EXAM: CT HEAD WITHOUT CONTRAST TECHNIQUE: Contiguous axial images were obtained from the base of the skull through the vertex without intravenous  contrast. Sagittal and coronal MPR images reconstructed from axial data set. COMPARISON:  02/05/2017 FINDINGS: Brain: Generalized atrophy. Normal ventricular morphology. No midline shift or mass effect. Small vessel chronic ischemic changes of deep cerebral white matter. No intracranial hemorrhage, mass lesion, evidence of acute infarction, or extra-axial fluid collection. Vascular: Atherosclerotic calcification of internal carotid arteries at skullbase Skull: Demineralized but intact Sinuses/Orbits: Clear Other: N/A IMPRESSION: Atrophy with small vessel chronic ischemic changes of deep cerebral white matter. No acute intracranial abnormalities. Electronically Signed   By: Lavonia Dana M.D.   On: Sep 09, 202019 15:19   Dg Chest Port 1 View  Result Date: 02/20/2017 CLINICAL DATA:  Altered mental status, fall, hypotension EXAM: PORTABLE CHEST 1 VIEW COMPARISON:  CTA chest dated 02/09/2017 FINDINGS: Mild bilateral lower lobe opacities, atelectasis versus pneumonia. Small right pleural effusion. Cardiomegaly with pulmonary vascular congestion. No frank interstitial edema. No pneumothorax. IMPRESSION: Mild bilateral lower lobe opacities, atelectasis versus pneumonia. Small right pleural effusion. Cardiomegaly pulmonary vascular congestion. No frank interstitial edema. Electronically Signed   By: Julian Hy M.D.   On: Sep 09, 202019 12:21    EKG: Independently reviewed.  Normal sinus rhythm with QTC of 460 ms.  Assessment/Plan Principal Problem:   Acute respiratory failure with hypoxia and hypercapnia (HCC) Active Problems:   COPD (chronic obstructive pulmonary disease) (HCC)   Rheumatoid arthritis (HCC)   Seizures (HCC)   Schizoaffective disorder,  bipolar type ([place])   Pulmonary artery hypertension (HCC)   PAF (paroxysmal atrial fibrillation) (HCC)   Acute encephalopathy    1. Acute respiratory failure with hypoxia and hypercarbia -likely a combination of possible COPD exacerbation and pneumonia.   Ammonia was mildly elevated.  We discussed with neurologist at this time neurologist said if ammonia continues to raise then may consider holding or discontinuing Depakote.  I am ordering 1 dose of lactulose 30 g p.o. repeat ammonia levels in the morning.  Repeat ABG continue BiPAP. 2. Acute encephalopathy likely from hypercarbia and elevated ammonia levels.  Repeat ABG repeat ammonia levels.  Patient is on BiPAP he did receive 1 dose of lactulose. 3. History of seizures on Keppra.  Patient states he tripped and fell and was oriented.  Unlikely to be seizures. 4. History of bipolar disorder on Depakote.  See #1 regarding Depakote. 5. History of COPD on BiPAP and steroids and nebulizer.  See #1. 6. Paroxysmal atrial fibrillation on amiodarone.  Not a candidate for amiodarone secondary to recurrent falls. 7. 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  Due to noncompliance. 8. Hypertension on amlodipine. 9. Chronic anemia. 10. History of chronic fracture of the right upper extremity due to recurrent falls.   DVT prophylaxis: Lovenox. Code Status: Full code. Family Communication: No family at the bedside. Disposition Plan: To be determined.  Patient refused to rehab last time. Consults called: Physical therapy. Admission status: Inpatient.   [name] MD [facility] Pager [phone].  If 7PM-7AM, please contact night-coverage [email or web address] Password [email or web address]  02/20/2017, 9:01 PM

## 2017-02-20 NOTE — Progress Notes (Signed)
RT transported patient from ED to 4E without any complications 

## 2017-02-20 NOTE — Progress Notes (Signed)
Pharmacy Antibiotic Note  Patrick Holt is a 67 y.o. male admitted on 02/20/2017 with sepsis.  Pharmacy has been consulted for cefepime and vancomycin dosing.  Normalized CrCl ~72ml/min.  Plan: Give vancomycin 2g IV x 1, then start vancomycin 1,250mg  IV Q24h Give cefepime 2g IV x 1, then start cefepime 1g IV Q8h Monitor clinical picture, renal function, VT prn F/U C&S, abx deescalation / LOT  Height: 5\' 7"  (170.2 cm) Weight: 235 lb (106.6 kg) IBW/kg (Calculated) : 66.1  Temp (24hrs), Avg:98.3 F (36.8 C), Min:98.3 F (36.8 C), Max:98.3 F (36.8 C)  No results for input(s): WBC, CREATININE, LATICACIDVEN, VANCOTROUGH, VANCOPEAK, VANCORANDOM, GENTTROUGH, GENTPEAK, GENTRANDOM, TOBRATROUGH, TOBRAPEAK, TOBRARND, AMIKACINPEAK, AMIKACINTROU, AMIKACIN in the last 168 hours.  Estimated Creatinine Clearance: 104.3 mL/min (by C-G formula based on SCr of 0.72 mg/dL).    Allergies  Allergen Reactions  . Immune Globulins Other (See Comments)    Acute renal failure  . Ciprofloxacin Swelling  . Flagyl [Metronidazole] Swelling  . Lisinopril Diarrhea  . Sulfa Antibiotics Other (See Comments)    blisters  . Morphine And Related     Does not help with pain  . Requip [Ropinirole Hcl]     constipation  . Betamethasone Dipropionate Other (See Comments)    Unknown  . Bupropion Hcl Other (See Comments)    Unknown  . Clobetasol Other (See Comments)    Unknown  . Codeine Other (See Comments)    Does not help with pain  . Escitalopram Oxalate Other (See Comments)    Unknown  . Fluoxetine Hcl Other (See Comments)    Unknown  . Furosemide Other (See Comments)    Unknown  . Meclizine Rash  . Paroxetine Other (See Comments)    Unknown  . Penicillins Other (See Comments)    Unknown reaction Has patient had a PCN reaction causing immediate rash, facial/tongue/throat swelling, SOB or lightheadedness with hypotension: unknown Has patient had a PCN reaction causing severe rash involving mucus  membranes or skin necrosis: unknown Has patient had a PCN reaction that required hospitalization unknown Has patient had a PCN reaction occurring within the last 10 years: unknown If all of the above answers are "NO", then may proceed with Cephalosporin use. Unknown Unknown reactio  . Tacrolimus Other (See Comments)    Unknown  . Tetanus Toxoid Other (See Comments)    Unknown  . Tuberculin Purified Protein Derivative Other (See Comments)    Unknown    Thank you for allowing pharmacy to be a part of this patient's care.  Patrick Holt 02/20/2017 12:00 PM

## 2017-02-20 NOTE — ED Triage Notes (Signed)
Per EMS:  Pt c/o fall this AM. Pt denies hitting head.  EMS reports when they got him to a sitting position he became hypotensive  lethargic and disoriented.  Pta vitals:  CBG 144, RR 14, Bp 96/58.

## 2017-02-20 NOTE — ED Notes (Signed)
I Stat Lactic Acid results shown to Dr. Isaacs 

## 2017-02-20 NOTE — Progress Notes (Signed)
Patient off BIPAP for trial per MD. Placed on 3L [place].  No distress noted at this time. RN aware. RT will continue to monitor.

## 2017-02-20 NOTE — ED Provider Notes (Signed)
[facility] EMERGENCY DEPARTMENT Provider Note   CSN: [id number] Arrival date & time: 02/20/17  1122     History   Chief Complaint Chief Complaint  Patient presents with  . Fall  . Altered Mental Status    HPI Patrick Holt is a 67 y.o. male.  HPI  67 year old male with extensive past medical history as below including dermatomyositis, bipolar disorder, paroxysmal A. fib, COPD, who presents with altered mental status.  History is severely limited due to patient's mental status.  Per EMS report, there were called to the scene after the patient called them stating that he had fallen.  On their arrival, the patient was very confused.  They tried to sit him up and he became hypotensive and more confused.  His blood pressure was 96/58.  He has been intermittently drowsy and unable to provide history since then.  Per review of records, the patient was just admitted on 12/27 for acute respiratory failure with hypoxia and hypercarbia as well as rhabdomyolysis.  Level 5 caveat invoked as remainder of history, ROS, and physical exam limited due to patient's mental status change.   Past Medical History:  Diagnosis Date  . Anemia   . Anxiety   . Asthma   . Atrial fibrillation ([place]) 01/2012   with RVR  . Bipolar 1 disorder ([place])   . Cancer ([place])   . Collagen vascular disease ([place])   . Conversion disorder 06/2010  . COPD (chronic obstructive pulmonary disease) ([place])   . Dermatomyositis ([place])   . Dermatomyositis ([place])   . Diverticulitis   . Diverticulosis of colon   . Dysrhythmia    "irregular" (11/15/2012)  . Esophageal dysmotility   . Esophageal stricture   . Fibromyalgia   . Gastritis   . GERD (gastroesophageal reflux disease)   . Hand fracture, right    sept 13, 2017, still wearing splint as og 01-10-2017  . Headache(784.0)    "severe; get them often" (11/15/2012)  . Hiatal hernia   . History of narcotic addiction ([place])   . Hx of adenomatous colonic  polyps   . Hyperlipidemia   . Hypertension   . Internal hemorrhoids   . Ischemic heart disease   . Major depression    with acute psychotic break in 06/2010  . Myocardial infarct ([place])    mulitple (1999, 2000, 2004)  . Narcotic dependence ([place])   . Nephrolithiasis   . Obesity   . OCD (obsessive compulsive disorder)   . Otosclerosis   . Paroxysmal A-fib ([place])   . Peripheral neuropathy   . Raynaud's disease   . Renal insufficiency   . Rheumatoid arthritis(714.0)   . Sarcoidosis   . Seizures ([place])   . Subarachnoid hemorrhage ([place]) 01/2012   with subdural  hematoma.   . Type II diabetes mellitus ([place])   . Urge incontinence   . Vertigo     Patient Active Problem List   Diagnosis Date Noted  . Rhabdomyolysis 02/05/2017  . Acute respiratory failure with hypoxia and hypercarbia ([place]) 02/05/2017  . COPD with acute exacerbation ([place]) 02/05/2017  . Bipolar 1 disorder ([place]) 02/05/2017  . Anemia 02/05/2017  . Hypertension 02/05/2017  . Hand fracture, right 02/05/2017  . Narcotic dependence ([place]) 02/05/2017  . Pre-diabetes 02/05/2017  . PAF (paroxysmal atrial fibrillation) ([place]) 02/05/2017  . Frequent falls 02/05/2017  . History of dermatomyositis 02/05/2017  . Seizure disorder ([place]) 02/05/2017  . Bilateral lower extremity edema 02/05/2017  . Pulmonary artery hypertension ([place])  01/15/2017  . Altered mental status 01/13/2017  . Low oxygen saturation   . Pain in right wrist 02/27/2016  . Altered mental state 11/14/2015  . Severe dehydration 11/14/2015  . Hypernatremia 11/14/2015  . Difficulty in walking, not elsewhere classified   . Right wrist fracture   . Closed displaced fracture of ulnar styloid with routine healing   . Scapholunate ligament injury with no instability   . Falls frequently   . Hypoxia 11/02/2015  . Anxiety 06/06/2015  . Impaired mobility 04/28/2015  . Lumbar spondylosis with myelopathy 03/07/2015  . Nocturia 12/03/2014  . Chronic venous insufficiency  09/23/2014  . Poor social situation 07/30/2014  . Schizophrenia, acute ([place])   . Schizoaffective disorder, bipolar type ([place])   . Decreased visual acuity 06/27/2014  . COPD exacerbation ([place])   . Right sided weakness 01/04/2014  . Essential hypertension, benign   . Atrial fibrillation, unspecified   . Radiculopathy of cervical region   . Polyuria 12/07/2013  . Erectile dysfunction 12/07/2013  . Diverticulitis 08/01/2013  . Acute diverticulitis 08/01/2013  . Hematochezia 06/04/2013  . Hypokalemia 10/03/2012  . Benign neoplasm of colon 09/27/2012  . Stricture and stenosis of esophagus 09/27/2012  . Diverticulitis of colon without hemorrhage 07/26/2012  . Polypharmacy 03/26/2012  . Seizures ([place]) 02/10/2012  . Atrial fibrillation ([place]) 02/05/2012  . Coronary atherosclerosis of native coronary artery 02/05/2012  . Radicular low back pain 03/09/2011  . History of seizure disorder 01/29/2011  . Irritable bowel syndrome (IBS) 06/24/2010  . Urge incontinence 06/19/2010  . TOBACCO USER 11/23/2008  . DEPRESSION, MAJOR 05/31/2008  . ISCHEMIC HEART DISEASE 05/31/2008  . RAYNAUD'S DISEASE 05/31/2008  . HIATAL HERNIA 05/31/2008  . Rheumatoid arthritis ([place]) 05/31/2008  . FIBROMYALGIA 05/31/2008  . Hyperlipidemia 01/26/2008  . Anxiety state 01/26/2008  . HEMORRHOIDS, INTERNAL 01/26/2008  . COPD (chronic obstructive pulmonary disease) ([place]) 01/26/2008  . ESOPHAGEAL MOTILITY DISORDER 01/26/2008  . GERD 01/26/2008  . Dermatomyositis ([place]) 01/26/2008    Past Surgical History:  Procedure Laterality Date  . BACK SURGERY    . CARDIAC CATHETERIZATION    . CARPAL TUNNEL RELEASE Bilateral   . CATARACT EXTRACTION W/ INTRAOCULAR LENS IMPLANT Left   . COLONOSCOPY N/A 09/27/2012   Procedure: COLONOSCOPY;  Surgeon: Lafayette Dragon, MD;  Location: [facility] ENDOSCOPY;  Service: Endoscopy;  Laterality: N/A;  . ESOPHAGOGASTRODUODENOSCOPY N/A 09/27/2012   Procedure: ESOPHAGOGASTRODUODENOSCOPY (EGD);  Surgeon:  Lafayette Dragon, MD;  Location: [facility];  Service: Endoscopy;  Laterality: N/A;  . KNEE ARTHROSCOPY W/ MENISCAL REPAIR Left 2009  . LUMBAR DISC SURGERY    . LYMPH NODE DISSECTION Right 1970's   "neck; dr thought I had Hodgkins; turned out to be sarcoidosis" (11/15/2012)  . squamous papilloma   2010   removed by Dr. Constance Holster ENT, noted on tongue  . TONSILLECTOMY         Home Medications    Prior to Admission medications   Medication Sig Start Date End Date Taking? Authorizing Provider  ALPRAZolam Duanne Moron) 1 MG tablet Take 1 mg by mouth 3 (three) times daily. 01/10/17   [provider]  amiodarone (PACERONE) 200 MG tablet Take 1 tablet (200 mg total) by mouth daily. 02/10/17   Alma Friendly, MD  amLODipine (NORVASC) 5 MG tablet Take 1 tablet (5 mg total) by mouth daily. 01/19/17   Velvet Bathe, MD  aspirin EC 81 MG tablet Take 1 tablet (81 mg total) by mouth daily. 10/17/15   [name], MD  atorvastatin (LIPITOR) 40 MG tablet Take 1 tablet (40 mg total) by mouth daily. 01/19/17   Velvet Bathe, MD  budesonide-formoterol [facility]) 160-4.5 MCG/ACT inhaler Inhale 2 puffs into the lungs 2 (two) times daily. 01/19/17   Velvet Bathe, MD  divalproex (DEPAKOTE ER) 250 MG 24 hr tablet Take 3 tablets (750 mg total) by mouth daily. 01/19/17   Velvet Bathe, MD  ergocalciferol (VITAMIN D2) 50000 units capsule Take 50,000 Units by mouth once a week.    [provider]  esomeprazole (NEXIUM) 40 MG capsule Take 40 mg by mouth daily. 07/26/16   [provider]  ipratropium-albuterol (DUONEB) 0.5-2.5 (3) MG/3ML SOLN Inhale 3 mLs into the lungs every 6 (six) hours as needed. Patient taking differently: Inhale 3 mLs into the lungs every 6 (six) hours as needed (for breathing).  11/17/15   Elgergawy, Silver Huguenin, MD  levETIRAcetam (KEPPRA) 500 MG tablet Take 500 mg by mouth 2 (two) times daily.    [provider]  loperamide (IMODIUM) 2 MG capsule Take 2 mg by mouth as  needed for diarrhea or loose stools.    [provider]  Multiple Vitamin (MULTIVITAMIN WITH MINERALS) TABS tablet Take 1 tablet by mouth daily. 10/17/15   [name], MD  oxybutynin (DITROPAN) 5 MG tablet Take 5 mg by mouth daily. 07/26/16   [provider]  traMADol (ULTRAM) 50 MG tablet Take 50 mg by mouth every 6 (six) hours as needed for moderate pain.    [provider]    Family History Family History  Problem Relation Age of Onset  . Alcohol abuse Mother   . Depression Mother   . Heart disease Mother   . Diabetes Mother   . Stroke Mother   . Coronary artery disease Father   . Diabetes Other        1/2 brother  . Hepatitis Brother     Social History Social History   Tobacco Use  . Smoking status: Former Smoker    Packs/day: 0.50    Years: 30.00    Pack years: 15.00    Types: Cigarettes    Last attempt to quit: 12/15/2014    Years since quitting: 2.1  . Smokeless tobacco: Never Used  . Tobacco comment: 3 cigs a day  Substance Use Topics  . Alcohol use: No    Alcohol/week: 0.0 oz    Comment: Alcohol stopped in September of 2014  . Drug use: No     Allergies   Immune globulins; Ciprofloxacin; Flagyl [metronidazole]; Lisinopril; Sulfa antibiotics; Morphine and related; Requip [ropinirole hcl]; Betamethasone dipropionate; Bupropion hcl; Clobetasol; Codeine; Escitalopram oxalate; Fluoxetine hcl; Furosemide; Meclizine; Paroxetine; Penicillins; Tacrolimus; Tetanus toxoid; and Tuberculin purified protein derivative   Review of Systems Review of Systems  Unable to perform ROS: Mental status change  Neurological: Positive for weakness.  Psychiatric/Behavioral: Positive for confusion.     Physical Exam Updated Vital Signs BP 114/72   Pulse 66   Temp 98.3 F (36.8 C) (Oral)   Resp 15   Ht 5\' 7"  (1.702 m)   Wt 106.6 kg (235 lb)   SpO2 100%   BMI 36.81 kg/m   Physical Exam  Constitutional: He is oriented to person, place, and time.  No distress.  Disheveled, confused  HENT:  Head: Normocephalic and atraumatic.  Mildly dry mucous membranes  Eyes: Conjunctivae are normal.  Neck: Neck supple.  Cardiovascular: Normal rate, regular rhythm and normal heart sounds. Exam reveals no friction rub.  No murmur  heard. Pulmonary/Chest: Effort normal and breath sounds normal. No respiratory distress. He has no wheezes. He has no rales.  Transmitted upper airway sounds  Abdominal: Soft. He exhibits no distension.  Musculoskeletal: He exhibits no edema.  Neurological: He is alert and oriented to person, place, and time. He exhibits normal muscle tone.  Skin: Skin is warm. Capillary refill takes less than 2 seconds.  Psychiatric: He has a normal mood and affect.  Nursing note and vitals reviewed.    ED Treatments / Results  Labs (all labs ordered are listed, but only abnormal results are displayed) Labs Reviewed  COMPREHENSIVE METABOLIC PANEL - Abnormal; Notable for the following components:      Result Value   Glucose, Bld 108 (*)    Creatinine, Ser 1.30 (*)    Calcium 8.4 (*)    Total Protein 5.8 (*)    Albumin 2.9 (*)    GFR calc non Af Amer 55 (*)    All other components within normal limits  CBC WITH DIFFERENTIAL/PLATELET - Abnormal; Notable for the following components:   Hemoglobin 12.4 (*)    MCH 25.9 (*)    RDW 19.6 (*)    All other components within normal limits  AMMONIA - Abnormal; Notable for the following components:   Ammonia 39 (*)    All other components within normal limits  I-STAT ARTERIAL BLOOD GAS, ED - Abnormal; Notable for the following components:   pH, Arterial 7.338 (*)    pCO2 arterial 49.7 (*)    All other components within normal limits  I-STAT CG4 LACTIC ACID, ED - Abnormal; Notable for the following components:   Lactic Acid, Venous 2.05 (*)    All other components within normal limits  I-STAT ARTERIAL BLOOD GAS, ED - Abnormal; Notable for the following components:   pH, Arterial  7.296 (*)    pCO2 arterial 50.0 (*)    pO2, Arterial 75.0 (*)    All other components within normal limits  I-STAT ARTERIAL BLOOD GAS, ED - Abnormal; Notable for the following components:   pH, Arterial 7.298 (*)    pCO2 arterial 50.9 (*)    pO2, Arterial 73.0 (*)    All other components within normal limits  CULTURE, BLOOD (ROUTINE X 2)  CULTURE, BLOOD (ROUTINE X 2)  URINALYSIS, ROUTINE W REFLEX MICROSCOPIC  VALPROIC ACID LEVEL  RAPID URINE DRUG SCREEN, HOSP PERFORMED  ETHANOL  I-STAT CG4 LACTIC ACID, ED    EKG  EKG Interpretation  Date/Time:  Sunday February 20 2017 11:57:29 EST Ventricular Rate:  80 PR Interval:    QRS Duration: 96 QT Interval:  398 QTC Calculation: 460 R Axis:   -29 Text Interpretation:  Sinus rhythm Borderline left axis deviation No significant change since last tracing Confirmed by Duffy Bruce [phone]) on 02/20/2017 12:52:47 PM       Radiology Ct Head Wo Contrast  Result Date: 02/20/2017 CLINICAL DATA:  Altered mental status, fell this morning, denies striking head, became hypotensive normal larger and disoriented after he was felt to a sitting position, history atrial fibrillation, prior subarachnoid hemorrhage, collagen vascular disease COPD sites, GERD hypertension, ischemic heart disease post MI, type II diabetes mellitus EXAM: CT HEAD WITHOUT CONTRAST TECHNIQUE: Contiguous axial images were obtained from the base of the skull through the vertex without intravenous contrast. Sagittal and coronal MPR images reconstructed from axial data set. COMPARISON:  02/05/2017 FINDINGS: Brain: Generalized atrophy. Normal ventricular morphology. No midline shift or mass effect. Small vessel chronic ischemic changes of  deep cerebral white matter. No intracranial hemorrhage, mass lesion, evidence of acute infarction, or extra-axial fluid collection. Vascular: Atherosclerotic calcification of internal carotid arteries at skullbase Skull: Demineralized but intact  Sinuses/Orbits: Clear Other: N/A IMPRESSION: Atrophy with small vessel chronic ischemic changes of deep cerebral white matter. No acute intracranial abnormalities. Electronically Signed   By: Lavonia Dana M.D.   On: 2020/02/1417 15:19   Dg Chest Port 1 View  Result Date: 02/20/2017 CLINICAL DATA:  Altered mental status, fall, hypotension EXAM: PORTABLE CHEST 1 VIEW COMPARISON:  CTA chest dated 02/09/2017 FINDINGS: Mild bilateral lower lobe opacities, atelectasis versus pneumonia. Small right pleural effusion. Cardiomegaly with pulmonary vascular congestion. No frank interstitial edema. No pneumothorax. IMPRESSION: Mild bilateral lower lobe opacities, atelectasis versus pneumonia. Small right pleural effusion. Cardiomegaly pulmonary vascular congestion. No frank interstitial edema. Electronically Signed   By: Julian Hy M.D.   On: 2020/02/1417 12:21    Procedures .Critical Care Performed by: Duffy Bruce, MD Authorized by: Duffy Bruce, MD   Critical care provider statement:    Critical care time (minutes):  35   Critical care time was exclusive of:  Separately billable procedures and treating other patients and teaching time   Critical care was necessary to treat or prevent imminent or life-threatening deterioration of the following conditions:  Respiratory failure, sepsis and dehydration   Critical care was time spent personally by me on the following activities:  Development of treatment plan with patient or surrogate, discussions with consultants, evaluation of patient's response to treatment, examination of patient, obtaining history from patient or surrogate, ordering and performing treatments and interventions, ordering and review of laboratory studies, ordering and review of radiographic studies, pulse oximetry, re-evaluation of patient's condition and review of old charts   I assumed direction of critical care for this patient from another provider in my specialty: no     (including  critical care time)  Medications Ordered in ED Medications  ceFEPIme (MAXIPIME) 1 g in dextrose 5 % 50 mL IVPB (not administered)  vancomycin (VANCOCIN) 1,250 mg in sodium chloride 0.9 % 250 mL IVPB (not administered)  naloxone (NARCAN) injection 2 mg (2 mg Intravenous Not Given 02/20/17 1651)  sodium chloride 0.9 % bolus 1,000 mL (0 mLs Intravenous Stopped 02/20/17 1347)    And  sodium chloride 0.9 % bolus 1,000 mL (0 mLs Intravenous Stopped 02/20/17 1443)  naloxone (NARCAN) injection 1 mg (1 mg Intravenous Given 02/20/17 1158)  vancomycin (VANCOCIN) 2,000 mg in sodium chloride 0.9 % 500 mL IVPB (0 mg Intravenous Stopped 02/20/17 1520)  ceFEPIme (MAXIPIME) 2 g in dextrose 5 % 50 mL IVPB (0 g Intravenous Stopped 02/20/17 1306)  albuterol (PROVENTIL) (2.5 MG/3ML) 0.083% nebulizer solution 5 mg (5 mg Nebulization Given 02/20/17 1617)  ipratropium (ATROVENT) nebulizer solution 0.5 mg (0.5 mg Nebulization Given 02/20/17 1617)  methylPREDNISolone sodium succinate (SOLU-MEDROL) 125 mg/2 mL injection 125 mg (125 mg Intravenous Given 02/20/17 1658)     Initial Impression / Assessment and Plan / ED Course  I have reviewed the triage vital signs and the nursing notes.  Pertinent labs & imaging results that were available during my care of the patient were reviewed by me and considered in my medical decision making (see chart for details).     67 year old male with extensive past medical history as above here with altered mental status.  History limited due to patient's altered mentation.  Etiology is unclear at this time.  I suspect it is multifactorial secondary to possible pneumonia with  COPD with possible substance induced encephalopathy.  Due to significant altered mental status, patient placed on BiPAP on arrival with improvement in mentation.  He was given Narcan without significant improvement.  Chest x-ray shows bibasilar atelectasis versus pneumonia and I have covered him broadly with HCAP.  Steroids and  breathing treatments also given.  Following these treatments, the patient does seem to be improving.  Will admit to the stepdown unit.  Given that he is increasingly alert and awake, do not feel he needs intubation. No focal neurological deficits and CT Head is negative.  Final Clinical Impressions(s) / ED Diagnoses   Final diagnoses:  HCAP (healthcare-associated pneumonia)  Encephalopathy     Duffy Bruce, MD 02/20/17 1721

## 2017-02-21 LAB — BASIC METABOLIC PANEL
ANION GAP: 5 (ref 5–15)
BUN: 8 mg/dL (ref 6–20)
CO2: 26 mmol/L (ref 22–32)
Calcium: 8.1 mg/dL — ABNORMAL LOW (ref 8.9–10.3)
Chloride: 112 mmol/L — ABNORMAL HIGH (ref 101–111)
Creatinine, Ser: 0.92 mg/dL (ref 0.61–1.24)
GFR calc Af Amer: >60 mL/min (ref >60)
GFR calc non Af Amer: >60 mL/min (ref >60)
GLUCOSE: 157 mg/dL — AB (ref 65–99)
POTASSIUM: 4.9 mmol/L (ref 3.5–5.1)
Sodium: 143 mmol/L (ref 135–145)

## 2017-02-21 LAB — RESPIRATORY PANEL BY PCR
Adenovirus: NOT DETECTED
BORDETELLA PERTUSSIS-RVPCR: NOT DETECTED
CHLAMYDOPHILA PNEUMONIAE-RVPPCR: NOT DETECTED
CORONAVIRUS 229E-RVPPCR: NOT DETECTED
CORONAVIRUS HKU1-RVPPCR: NOT DETECTED
Coronavirus NL63: NOT DETECTED
Coronavirus OC43: NOT DETECTED
Influenza A: NOT DETECTED
Influenza B: NOT DETECTED
METAPNEUMOVIRUS-RVPPCR: NOT DETECTED
Mycoplasma pneumoniae: NOT DETECTED
PARAINFLUENZA VIRUS 2-RVPPCR: NOT DETECTED
Parainfluenza Virus 1: NOT DETECTED
Parainfluenza Virus 3: NOT DETECTED
Parainfluenza Virus 4: NOT DETECTED
RESPIRATORY SYNCYTIAL VIRUS-RVPPCR: NOT DETECTED
Rhinovirus / Enterovirus: NOT DETECTED

## 2017-02-21 LAB — BLOOD GAS, ARTERIAL
ACID-BASE EXCESS: 0.8 mmol/L (ref 0.0–2.0)
BICARBONATE: 25.8 mmol/L (ref 20.0–28.0)
DELIVERY SYSTEMS: POSITIVE
Drawn by: 347621
EXPIRATORY PAP: 6
FIO2: 0.4
HI FREQUENCY JET VENT RATE: 8
Inspiratory PAP: 12
O2 Saturation: 94.3 %
PH ART: 7.343 — AB (ref 7.350–7.450)
Patient temperature: 98.6
pCO2 arterial: 48.8 mmHg — ABNORMAL HIGH (ref 32.0–48.0)
pO2, Arterial: 73.9 mmHg — ABNORMAL LOW (ref 83.0–108.0)

## 2017-02-21 LAB — BLOOD CULTURE ID PANEL (REFLEXED)
Acinetobacter baumannii: NOT DETECTED
CANDIDA ALBICANS: NOT DETECTED
CANDIDA GLABRATA: NOT DETECTED
CANDIDA KRUSEI: NOT DETECTED
Candida parapsilosis: NOT DETECTED
Candida tropicalis: NOT DETECTED
ENTEROBACTER CLOACAE COMPLEX: NOT DETECTED
ENTEROBACTERIACEAE SPECIES: NOT DETECTED
ENTEROCOCCUS SPECIES: NOT DETECTED
ESCHERICHIA COLI: NOT DETECTED
Haemophilus influenzae: NOT DETECTED
Klebsiella oxytoca: NOT DETECTED
Klebsiella pneumoniae: NOT DETECTED
LISTERIA MONOCYTOGENES: NOT DETECTED
Methicillin resistance: DETECTED — AB
NEISSERIA MENINGITIDIS: NOT DETECTED
PROTEUS SPECIES: NOT DETECTED
PSEUDOMONAS AERUGINOSA: NOT DETECTED
STREPTOCOCCUS AGALACTIAE: NOT DETECTED
STREPTOCOCCUS PNEUMONIAE: NOT DETECTED
STREPTOCOCCUS PYOGENES: NOT DETECTED
STREPTOCOCCUS SPECIES: NOT DETECTED
Serratia marcescens: NOT DETECTED
Staphylococcus aureus (BCID): NOT DETECTED
Staphylococcus species: DETECTED — AB

## 2017-02-21 LAB — HEPATIC FUNCTION PANEL
ALK PHOS: 68 U/L (ref 38–126)
ALT: 13 U/L — ABNORMAL LOW (ref 17–63)
AST: 26 U/L (ref 15–41)
Albumin: 2.5 g/dL — ABNORMAL LOW (ref 3.5–5.0)
BILIRUBIN DIRECT: 0.4 mg/dL (ref 0.1–0.5)
BILIRUBIN TOTAL: 1.2 mg/dL (ref 0.3–1.2)
Indirect Bilirubin: 0.8 mg/dL (ref 0.3–0.9)
Total Protein: 5.5 g/dL — ABNORMAL LOW (ref 6.5–8.1)

## 2017-02-21 LAB — GLUCOSE, CAPILLARY
GLUCOSE-CAPILLARY: 142 mg/dL — AB (ref 65–99)
GLUCOSE-CAPILLARY: 160 mg/dL — AB (ref 65–99)
GLUCOSE-CAPILLARY: 203 mg/dL — AB (ref 65–99)
Glucose-Capillary: >600 mg/dL (ref 65–99)

## 2017-02-21 LAB — CBC
HEMATOCRIT: 41.4 % (ref 39.0–52.0)
HEMOGLOBIN: 12.3 g/dL — AB (ref 13.0–17.0)
MCH: 25.7 pg — AB (ref 26.0–34.0)
MCHC: 29.7 g/dL — AB (ref 30.0–36.0)
MCV: 86.4 fL (ref 78.0–100.0)
Platelets: 239 10*3/uL (ref 150–400)
RBC: 4.79 MIL/uL (ref 4.22–5.81)
RDW: 19.8 % — AB (ref 11.5–15.5)
WBC: 3.4 10*3/uL — ABNORMAL LOW (ref 4.0–10.5)

## 2017-02-21 LAB — RAPID URINE DRUG SCREEN, HOSP PERFORMED
Amphetamines: NOT DETECTED
BARBITURATES: POSITIVE — AB
BENZODIAZEPINES: POSITIVE — AB
COCAINE: NOT DETECTED
OPIATES: NOT DETECTED
Tetrahydrocannabinol: NOT DETECTED

## 2017-02-21 LAB — AMMONIA: Ammonia: 39 umol/L — ABNORMAL HIGH (ref 9–35)

## 2017-02-21 LAB — INFLUENZA PANEL BY PCR (TYPE A & B)
INFLAPCR: NEGATIVE
INFLBPCR: NEGATIVE

## 2017-02-21 MED ORDER — IPRATROPIUM-ALBUTEROL 0.5-2.5 (3) MG/3ML IN SOLN
3.0000 mL | Freq: Two times a day (BID) | RESPIRATORY_TRACT | Status: DC
  Administered 2017-02-21 – 2017-02-23 (×4): 3 mL via RESPIRATORY_TRACT
  Filled 2017-02-21 (×4): qty 3

## 2017-02-21 MED ORDER — METHYLPREDNISOLONE SODIUM SUCC 40 MG IJ SOLR
40.0000 mg | Freq: Two times a day (BID) | INTRAMUSCULAR | Status: DC
  Administered 2017-02-21 – 2017-02-22 (×4): 40 mg via INTRAVENOUS
  Filled 2017-02-21 (×4): qty 1

## 2017-02-21 MED ORDER — VANCOMYCIN HCL IN DEXTROSE 750-5 MG/150ML-% IV SOLN
750.0000 mg | Freq: Two times a day (BID) | INTRAVENOUS | Status: DC
  Administered 2017-02-21 – 2017-02-23 (×4): 750 mg via INTRAVENOUS
  Filled 2017-02-21 (×5): qty 150

## 2017-02-21 NOTE — Progress Notes (Signed)
PROGRESS NOTE    Patrick Holt  YTK:[id number] DOB: 03/31/1950 DOA: 02/20/2017 PCP: Medicine, [facility]    Brief Narrative:  67 y.o. male with history of COPD, hypertension, bipolar disorder, seizure, dermatomyositis, paroxysmal atrial fibrillation, previous stroke was brought to the ER after patient had a fall at home and was unable to get up from the fall.  Patient states he fell yesterday and was unable to communicate until he reached the phone today and called EMS.  Patient states he tripped and fell and did not hit his head or lose consciousness.  Denies any chest pain or shortness of breath.  EMS found that patient was mildly drowsy.  Not answering properly. Patient noted to have evidence of PNA with hypercarbic failure, admitted for further work up  Assessment & Plan:   Principal Problem:   Acute respiratory failure with hypoxia and hypercapnia (HCC) Active Problems:   COPD (chronic obstructive pulmonary disease) (HCC)   Rheumatoid arthritis (HCC)   Seizures (HCC)   Schizoaffective disorder, bipolar type ([place])   Pulmonary artery hypertension (HCC)   PAF (paroxysmal atrial fibrillation) ([place])   Acute encephalopathy  1. Acute respiratory failure with hypoxia and hypercarbia 1. likely a combination of possible COPD exacerbation and pneumonia.  2. Initially bipap dependent, now on  3. Continue scheduled nebs and IV steroids 2. Acute encephalopathy likely from hypercarbia and elevated ammonia levels 1. Repeat ABG improved, correlating to improved mental status 2. He did receive 1 dose of lactulose. 3. History of seizures on Keppra. 1. Patient states he tripped and fell and was oriented.   2. Unlikely to be seizures. Cont seizure meds 4. History of bipolar disorder on Depakote.   1. Stable at this time. 5. History of COPD on BiPAP and steroids and nebulizer.  See #1. 6. Paroxysmal atrial fibrillation on amiodarone.   1. Not a candidate for amiodarone  secondary to recurrent falls. 2. Stable at present 7. 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   1. Due to noncompliance. 2. Currently stable 8. Hypertension on amlodipine. 1. Currently stable 9. Chronic anemia. 10. History of chronic fracture of the right upper extremity due to recurrent falls.   DVT prophylaxis: Lovenox subQ Code Status: Full Family Communication: Pt in room, family not at bedside Disposition Plan: Uncertain at this time  Consultants:     Procedures:     Antimicrobials: Anti-infectives (From admission, onward)   Start     Dose/Rate Route Frequency Ordered Stop   02/21/17 1330  vancomycin (VANCOCIN) IVPB 750 mg/150 ml premix     750 mg 150 mL/hr over 60 Minutes Intravenous Every 12 hours 02/21/17 1252     02/21/17 1200  vancomycin (VANCOCIN) 1,250 mg in sodium chloride 0.9 % 250 mL IVPB  Status:  Discontinued     1,250 mg 166.7 mL/hr over 90 Minutes Intravenous Every 24 hours 02/20/17 1306 02/21/17 1252   02/20/17 2000  ceFEPIme (MAXIPIME) 1 g in dextrose 5 % 50 mL IVPB     1 g 100 mL/hr over 30 Minutes Intravenous Every 8 hours 02/20/17 1306     02/20/17 1215  vancomycin (VANCOCIN) 2,000 mg in sodium chloride 0.9 % 500 mL IVPB     2,000 mg 250 mL/hr over 120 Minutes Intravenous  Once 02/20/17 1200 02/20/17 1520   02/20/17 1200  vancomycin (VANCOCIN) IVPB 1000 mg/200 mL premix  Status:  Discontinued     1,000 mg 200 mL/hr over 60 Minutes Intravenous  Once  02/20/17 1153 02/20/17 1200   02/20/17 1200  ceFEPIme (MAXIPIME) 2 g in dextrose 5 % 50 mL IVPB     2 g 100 mL/hr over 30 Minutes Intravenous  Once 02/20/17 1200 02/20/17 1306       Subjective: Feels better today  Objective: Vitals:   02/21/17 0440 02/21/17 0700 02/21/17 0755 02/21/17 1157  BP:   100/60 114/76  Pulse: 64  83 80  Resp: 12 14 15 16   Temp:   97.7 F (36.5 C) (!) 97.5 F (36.4 C)  TempSrc:   Oral Oral  SpO2: 96%  92% 96%  Weight:      Height:         Intake/Output Summary (Last 24 hours) at 02/21/2017 1612 Last data filed at 02/21/2017 1322 Gross per 24 hour  Intake 360 ml  Output 625 ml  Net -265 ml   Filed Weights   02/20/17 1130 02/20/17 2020  Weight: 106.6 kg (235 lb) 107 kg (235 lb 14.3 oz)    Examination:  General exam: Appears calm and comfortable  Respiratory system: Clear to auscultation. Respiratory effort normal. Cardiovascular system: S1 & S2 heard, RRR. Gastrointestinal system: Abdomen is nondistended, soft and nontender. No organomegaly or masses felt. Normal bowel sounds heard. Central nervous system: Alert and oriented. No focal neurological deficits. Extremities: Symmetric 5 x 5 power. Skin: No rashes, lesions Psychiatry: Judgement and insight appear normal. Mood & affect appropriate.   Data Reviewed: I have personally reviewed following labs and imaging studies  CBC: Recent Labs  Lab 02/20/17 1211 02/20/17 2138 02/21/17 0212  WBC 6.5 4.0 3.4*  NEUTROABS 3.1  --   --   HGB 12.4* 12.4* 12.3*  HCT 40.9 41.5 41.4  MCV 85.4 86.1 86.4  PLT 239 245 299   Basic Metabolic Panel: Recent Labs  Lab 02/20/17 1211 02/20/17 2138 02/21/17 0212  NA 140  --  143  K 3.8  --  4.9  CL 108  --  112*  CO2 26  --  26  GLUCOSE 108*  --  157*  BUN 9  --  8  CREATININE 1.30* 1.51* 0.92  CALCIUM 8.4*  --  8.1*   GFR: Estimated Creatinine Clearance: 90.9 mL/min (by C-G formula based on SCr of 0.92 mg/dL). Liver Function Tests: Recent Labs  Lab 02/20/17 1211 02/21/17 0212  AST 20 26  ALT 18 13*  ALKPHOS 73 68  BILITOT 0.8 1.2  PROT 5.8* 5.5*  ALBUMIN 2.9* 2.5*   No results for input(s): LIPASE, AMYLASE in the last 168 hours. Recent Labs  Lab 02/20/17 1158 02/21/17 0823  AMMONIA 39* 39*   Coagulation Profile: No results for input(s): INR, PROTIME in the last 168 hours. Cardiac Enzymes: Recent Labs  Lab 02/20/17 2138  CKTOTAL 134  TROPONINI <0.03   BNP (last 3 results) No results for  input(s): PROBNP in the last 8760 hours. HbA1C: No results for input(s): HGBA1C in the last 72 hours. CBG: Recent Labs  Lab 02/21/17 0034 02/21/17 0820 02/21/17 0832  GLUCAP 142* >600* 203*   Lipid Profile: No results for input(s): CHOL, HDL, LDLCALC, TRIG, CHOLHDL, LDLDIRECT in the last 72 hours. Thyroid Function Tests: Recent Labs    02/20/17 2138  TSH 0.394   Anemia Panel: No results for input(s): VITAMINB12, FOLATE, FERRITIN, TIBC, IRON, RETICCTPCT in the last 72 hours. Sepsis Labs: Recent Labs  Lab 02/20/17 1220 02/20/17 1400 02/20/17 2138  PROCALCITON  --   --  <0.10  LATICACIDVEN 2.05* 1.04  0.8    Recent Results (from the past 240 hour(s))  Blood Culture (routine x 2)     Status: None (Preliminary result)   Collection Time: 02/20/17 12:08 PM  Result Value Ref Range Status   Specimen Description BLOOD LEFT ANTECUBITAL  Final   Special Requests IN PEDIATRIC BOTTLE Blood Culture adequate volume  Final   Culture NO GROWTH < 24 HOURS  Final   Report Status PENDING  Incomplete  Blood Culture (routine x 2)     Status: None (Preliminary result)   Collection Time: 02/20/17 12:08 PM  Result Value Ref Range Status   Specimen Description BLOOD RIGHT ANTECUBITAL  Final   Special Requests   Final    BOTTLES DRAWN AEROBIC AND ANAEROBIC Blood Culture adequate volume   Culture  Setup Time   Final    GRAM POSITIVE COCCI IN CLUSTERS AEROBIC BOTTLE ONLY GRAM POSITIVE RODS IN BOTH AEROBIC AND ANAEROBIC BOTTLES CRITICAL RESULT CALLED TO, READ BACK BY AND VERIFIED WITH: Alvino Chapel.D. 10:40 02/21/17 ([email or web address])    Culture GRAM POSITIVE COCCI GRAM POSITIVE RODS   Final   Report Status PENDING  Incomplete  Blood Culture ID Panel (Reflexed)     Status: Abnormal   Collection Time: 02/20/17 12:08 PM  Result Value Ref Range Status   Enterococcus species NOT DETECTED NOT DETECTED Final   Listeria monocytogenes NOT DETECTED NOT DETECTED Final   Staphylococcus species DETECTED (A)  NOT DETECTED Final    Comment: Methicillin (oxacillin) resistant coagulase negative staphylococcus. Possible blood culture contaminant (unless isolated from more than one blood culture draw or clinical case suggests pathogenicity). No antibiotic treatment is indicated for blood  culture contaminants. CRITICAL RESULT CALLED TO, READ BACK BY AND VERIFIED WITH: Alvino Chapel.D. 10:40 02/21/17 ([email or web address])    Staphylococcus aureus NOT DETECTED NOT DETECTED Final   Methicillin resistance DETECTED (A) NOT DETECTED Final    Comment: CRITICAL RESULT CALLED TO, READ BACK BY AND VERIFIED WITH: EDaisy Lazar.D. 10:40 02/21/17 ([email or web address])    Streptococcus species NOT DETECTED NOT DETECTED Final   Streptococcus agalactiae NOT DETECTED NOT DETECTED Final   Streptococcus pneumoniae NOT DETECTED NOT DETECTED Final   Streptococcus pyogenes NOT DETECTED NOT DETECTED Final   Acinetobacter baumannii NOT DETECTED NOT DETECTED Final   Enterobacteriaceae species NOT DETECTED NOT DETECTED Final   Enterobacter cloacae complex NOT DETECTED NOT DETECTED Final   Escherichia coli NOT DETECTED NOT DETECTED Final   Klebsiella oxytoca NOT DETECTED NOT DETECTED Final   Klebsiella pneumoniae NOT DETECTED NOT DETECTED Final   Proteus species NOT DETECTED NOT DETECTED Final   Serratia marcescens NOT DETECTED NOT DETECTED Final   Haemophilus influenzae NOT DETECTED NOT DETECTED Final   Neisseria meningitidis NOT DETECTED NOT DETECTED Final   Pseudomonas aeruginosa NOT DETECTED NOT DETECTED Final   Candida albicans NOT DETECTED NOT DETECTED Final   Candida glabrata NOT DETECTED NOT DETECTED Final   Candida krusei NOT DETECTED NOT DETECTED Final   Candida parapsilosis NOT DETECTED NOT DETECTED Final   Candida tropicalis NOT DETECTED NOT DETECTED Final     Radiology Studies: Ct Head Wo Contrast  Result Date: 02/20/2017 CLINICAL DATA:  Altered mental status, fell this morning, denies striking head, became hypotensive normal  larger and disoriented after he was felt to a sitting position, history atrial fibrillation, prior subarachnoid hemorrhage, collagen vascular disease COPD sites, GERD hypertension, ischemic heart disease post MI, type II diabetes mellitus EXAM: CT HEAD WITHOUT CONTRAST TECHNIQUE: Contiguous axial images were obtained from  the base of the skull through the vertex without intravenous contrast. Sagittal and coronal MPR images reconstructed from axial data set. COMPARISON:  02/05/2017 FINDINGS: Brain: Generalized atrophy. Normal ventricular morphology. No midline shift or mass effect. Small vessel chronic ischemic changes of deep cerebral white matter. No intracranial hemorrhage, mass lesion, evidence of acute infarction, or extra-axial fluid collection. Vascular: Atherosclerotic calcification of internal carotid arteries at skullbase Skull: Demineralized but intact Sinuses/Orbits: Clear Other: N/A IMPRESSION: Atrophy with small vessel chronic ischemic changes of deep cerebral white matter. No acute intracranial abnormalities. Electronically Signed   By: Lavonia Dana M.D.   On: September 25, 202019 15:19   Dg Chest Port 1 View  Result Date: 02/20/2017 CLINICAL DATA:  Altered mental status, fall, hypotension EXAM: PORTABLE CHEST 1 VIEW COMPARISON:  CTA chest dated 02/09/2017 FINDINGS: Mild bilateral lower lobe opacities, atelectasis versus pneumonia. Small right pleural effusion. Cardiomegaly with pulmonary vascular congestion. No frank interstitial edema. No pneumothorax. IMPRESSION: Mild bilateral lower lobe opacities, atelectasis versus pneumonia. Small right pleural effusion. Cardiomegaly pulmonary vascular congestion. No frank interstitial edema. Electronically Signed   By: Julian Hy M.D.   On: September 25, 202019 12:21    Scheduled Meds: . amiodarone  200 mg Oral Daily  . amLODipine  5 mg Oral Daily  . aspirin EC  81 mg Oral Daily  . atorvastatin  40 mg Oral q1800  . budesonide (PULMICORT) nebulizer solution  0.25  mg Nebulization BID  . divalproex  750 mg Oral Daily  . enoxaparin (LOVENOX) injection  40 mg Subcutaneous Q24H  . ipratropium-albuterol  3 mL Nebulization BID  . levETIRAcetam  500 mg Oral BID  . methylPREDNISolone (SOLU-MEDROL) injection  40 mg Intravenous Q12H  . oxybutynin  5 mg Oral Daily  . pantoprazole  40 mg Oral Daily   Continuous Infusions: . ceFEPime (MAXIPIME) IV Stopped (02/21/17 1138)  . vancomycin 750 mg (02/21/17 1437)     LOS: 1 day   Marylu Lund, MD [facility] Pager [phone]  If 7PM-7AM, please contact night-coverage [email or web address] Password [email or web address] 02/21/2017, 4:12 PM

## 2017-02-21 NOTE — Progress Notes (Signed)
PHARMACY - PHYSICIAN COMMUNICATION CRITICAL VALUE ALERT - BLOOD CULTURE IDENTIFICATION (BCID)  Patrick Holt is an 67 y.o. male who presented to [facility] on 02/20/2017 with a chief complaint of a fall  Assessment: 21 YOM who presented after being unable to get up s/p fall. Work-up revealed possible PNA on CXR and given recent admit in December - the patient was placed on empiric antibiotics for HCAP.  Name of physician (or Provider) ContactedWyline Copas (via text page)  Current antibiotics: Vancomycin + Cefepime  Changes to prescribed antibiotics recommended:  Patient is on recommended antibiotics - No changes needed. The patient is being broadly treated for HCAP.  Results for orders placed or performed during the hospital encounter of 02/20/17  Blood Culture ID Panel (Reflexed) (Collected: 02/20/2017 12:08 PM)  Result Value Ref Range   Enterococcus species NOT DETECTED NOT DETECTED   Listeria monocytogenes NOT DETECTED NOT DETECTED   Staphylococcus species DETECTED (A) NOT DETECTED   Staphylococcus aureus NOT DETECTED NOT DETECTED   Methicillin resistance DETECTED (A) NOT DETECTED   Streptococcus species NOT DETECTED NOT DETECTED   Streptococcus agalactiae NOT DETECTED NOT DETECTED   Streptococcus pneumoniae NOT DETECTED NOT DETECTED   Streptococcus pyogenes NOT DETECTED NOT DETECTED   Acinetobacter baumannii NOT DETECTED NOT DETECTED   Enterobacteriaceae species NOT DETECTED NOT DETECTED   Enterobacter cloacae complex NOT DETECTED NOT DETECTED   Escherichia coli NOT DETECTED NOT DETECTED   Klebsiella oxytoca NOT DETECTED NOT DETECTED   Klebsiella pneumoniae NOT DETECTED NOT DETECTED   Proteus species NOT DETECTED NOT DETECTED   Serratia marcescens NOT DETECTED NOT DETECTED   Haemophilus influenzae NOT DETECTED NOT DETECTED   Neisseria meningitidis NOT DETECTED NOT DETECTED   Pseudomonas aeruginosa NOT DETECTED NOT DETECTED   Candida albicans NOT DETECTED NOT DETECTED   Candida  glabrata NOT DETECTED NOT DETECTED   Candida krusei NOT DETECTED NOT DETECTED   Candida parapsilosis NOT DETECTED NOT DETECTED   Candida tropicalis NOT DETECTED NOT DETECTED    Lawson Radar 02/21/2017  10:45 AM

## 2017-02-22 ENCOUNTER — Ambulatory Visit: Payer: Self-pay | Admitting: Nurse Practitioner

## 2017-02-22 LAB — GLUCOSE, CAPILLARY
GLUCOSE-CAPILLARY: 158 mg/dL — AB (ref 65–99)
Glucose-Capillary: 102 mg/dL — ABNORMAL HIGH (ref 65–99)
Glucose-Capillary: 133 mg/dL — ABNORMAL HIGH (ref 65–99)

## 2017-02-22 MED ORDER — AMIODARONE HCL 200 MG PO TABS
200.0000 mg | ORAL_TABLET | ORAL | Status: AC
  Administered 2017-02-22: 200 mg via ORAL
  Filled 2017-02-22: qty 1

## 2017-02-22 MED ORDER — LABETALOL HCL 5 MG/ML IV SOLN: 5.0000 mg | INTRAVENOUS | Status: DC | PRN

## 2017-02-22 MED ORDER — AMIODARONE HCL 200 MG PO TABS: 400.0000 mg | ORAL_TABLET | Freq: Every day | ORAL | Status: DC

## 2017-02-22 NOTE — Progress Notes (Signed)
PROGRESS NOTE    Patrick Holt  UTM:[id number] DOB: [birth year] DOA: 02/20/2017 PCP: Medicine, [facility]    Brief Narrative:  67 y.o. male with history of COPD, hypertension, bipolar disorder, seizure, dermatomyositis, paroxysmal atrial fibrillation, previous stroke was brought to the ER after patient had a fall at home and was unable to get up from the fall.  Patient states he fell yesterday and was unable to communicate until he reached the phone today and called EMS.  Patient states he tripped and fell and did not hit his head or lose consciousness.  Denies any chest pain or shortness of breath.  EMS found that patient was mildly drowsy.  Not answering properly. Patient noted to have evidence of PNA with hypercarbic failure, admitted for further work up  Assessment & Plan:   Principal Problem:   Acute respiratory failure with hypoxia and hypercapnia (HCC) Active Problems:   COPD (chronic obstructive pulmonary disease) (HCC)   Rheumatoid arthritis (HCC)   Seizures (HCC)   Schizoaffective disorder, bipolar type ([place])   Pulmonary artery hypertension (HCC)   PAF (paroxysmal atrial fibrillation) ([place])   Acute encephalopathy  1. Acute respiratory failure with hypoxia and hypercarbia 1. likely a combination of possible COPD exacerbation and pneumonia.  2. Initially bipap dependent, now on [place] 3. Continue scheduled nebs and IV steroids 4. Improving and now on minimal O2 support 2. Acute encephalopathy likely from hypercarbia and elevated ammonia levels 1. Repeat ABG improved, correlating to improved mental status 2. He did receive 1 dose of lactulose. 3. History of seizures on Keppra. 1. Patient states he tripped and fell and was oriented.   2. Unlikely to be seizures. Cont seizure meds 4. History of bipolar disorder on Depakote.   1. Stable at this time. 5. History of COPD on BiPAP and steroids and nebulizer.  See #1. 6. Paroxysmal atrial fibrillation on amiodarone  in AVR.   1. Not a candidate for amiodarone secondary to recurrent falls. 2. Patient now with HR in the 120-130's 3. Have ordered IV labetalol PRN 4. Will increase amiodarone to 400mg   5. Continue to monitor on tele 6. If continued RVR, would consider starting cardizem gtt overnight 7. 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   1. Due to noncompliance. 2. Currently stable 8. Hypertension on amlodipine. 1. Currently stable 9. Chronic anemia. 10. History of chronic fracture of the right upper extremity due to recurrent falls.   DVT prophylaxis: Lovenox subQ Code Status: Full Family Communication: Pt in room, family not at bedside Disposition Plan: Uncertain at this time  Consultants:     Procedures:     Antimicrobials: Anti-infectives (From admission, onward)   Start     Dose/Rate Route Frequency Ordered Stop   02/21/17 1330  vancomycin (VANCOCIN) IVPB 750 mg/150 ml premix     750 mg 150 mL/hr over 60 Minutes Intravenous Every 12 hours 02/21/17 1252     02/21/17 1200  vancomycin (VANCOCIN) 1,250 mg in sodium chloride 0.9 % 250 mL IVPB  Status:  Discontinued     1,250 mg 166.7 mL/hr over 90 Minutes Intravenous Every 24 hours 02/20/17 1306 02/21/17 1252   02/20/17 2000  ceFEPIme (MAXIPIME) 1 g in dextrose 5 % 50 mL IVPB     1 g 100 mL/hr over 30 Minutes Intravenous Every 8 hours 02/20/17 1306     02/20/17 1215  vancomycin (VANCOCIN) 2,000 mg in sodium chloride 0.9 % 500 mL IVPB     2,000  mg 250 mL/hr over 120 Minutes Intravenous  Once 02/20/17 1200 02/20/17 1520   02/20/17 1200  vancomycin (VANCOCIN) IVPB 1000 mg/200 mL premix  Status:  Discontinued     1,000 mg 200 mL/hr over 60 Minutes Intravenous  Once 02/20/17 1153 02/20/17 1200   02/20/17 1200  ceFEPIme (MAXIPIME) 2 g in dextrose 5 % 50 mL IVPB     2 g 100 mL/hr over 30 Minutes Intravenous  Once 02/20/17 1200 02/20/17 1306      Subjective: Eager to go home  Objective: Vitals:   02/22/17  0419 02/22/17 0830 02/22/17 1141 02/22/17 1554  BP: 121/69  117/68 121/77  Pulse: 68  (!) 132 64  Resp: 13  20 15   Temp: (!) 97.5 F (36.4 C)   98.2 F (36.8 C)  TempSrc: Oral   Oral  SpO2: 97% 93% 90% 95%  Weight:      Height:        Intake/Output Summary (Last 24 hours) at 02/22/2017 1616 Last data filed at 02/22/2017 1300 Gross per 24 hour  Intake 600 ml  Output 652 ml  Net -52 ml   Filed Weights   02/20/17 1130 02/20/17 2020  Weight: 106.6 kg (235 lb) 107 kg (235 lb 14.3 oz)    Examination: General exam: Conversant, in no acute distress Respiratory system: normal chest rise, clear, no audible wheezing Cardiovascular system: irregularly irregular, s1-s2 Gastrointestinal system: Nondistended, nontender, pos BS Central nervous system: No seizures, no tremors Extremities: No cyanosis, no joint deformities Skin: No rashes, no pallor Psychiatry: Affect normal // no auditory hallucinations    Data Reviewed: I have personally reviewed following labs and imaging studies  CBC: Recent Labs  Lab 02/20/17 1211 02/20/17 2138 02/21/17 0212  WBC 6.5 4.0 3.4*  NEUTROABS 3.1  --   --   HGB 12.4* 12.4* 12.3*  HCT 40.9 41.5 41.4  MCV 85.4 86.1 86.4  PLT 239 245 694   Basic Metabolic Panel: Recent Labs  Lab 02/20/17 1211 02/20/17 2138 02/21/17 0212  NA 140  --  143  K 3.8  --  4.9  CL 108  --  112*  CO2 26  --  26  GLUCOSE 108*  --  157*  BUN 9  --  8  CREATININE 1.30* 1.51* 0.92  CALCIUM 8.4*  --  8.1*   GFR: Estimated Creatinine Clearance: 90.9 mL/min (by C-G formula based on SCr of 0.92 mg/dL). Liver Function Tests: Recent Labs  Lab 02/20/17 1211 02/21/17 0212  AST 20 26  ALT 18 13*  ALKPHOS 73 68  BILITOT 0.8 1.2  PROT 5.8* 5.5*  ALBUMIN 2.9* 2.5*   No results for input(s): LIPASE, AMYLASE in the last 168 hours. Recent Labs  Lab 02/20/17 1158 02/21/17 0823  AMMONIA 39* 39*   Coagulation Profile: No results for input(s): INR, PROTIME in the last  168 hours. Cardiac Enzymes: Recent Labs  Lab 02/20/17 2138  CKTOTAL 134  TROPONINI <0.03   BNP (last 3 results) No results for input(s): PROBNP in the last 8760 hours. HbA1C: No results for input(s): HGBA1C in the last 72 hours. CBG: Recent Labs  Lab 02/21/17 0820 02/21/17 0832 02/21/17 1619 02/22/17 0806 02/22/17 1556  GLUCAP >600* 203* 160* 102* 158*   Lipid Profile: No results for input(s): CHOL, HDL, LDLCALC, TRIG, CHOLHDL, LDLDIRECT in the last 72 hours. Thyroid Function Tests: Recent Labs    02/20/17 2138  TSH 0.394   Anemia Panel: No results for input(s): VITAMINB12, FOLATE, FERRITIN,  TIBC, IRON, RETICCTPCT in the last 72 hours. Sepsis Labs: Recent Labs  Lab 02/20/17 1220 02/20/17 1400 02/20/17 2138  PROCALCITON  --   --  <0.10  LATICACIDVEN 2.05* 1.04 0.8    Recent Results (from the past 240 hour(s))  Blood Culture (routine x 2)     Status: None (Preliminary result)   Collection Time: 02/20/17 12:08 PM  Result Value Ref Range Status   Specimen Description BLOOD LEFT ANTECUBITAL  Final   Special Requests IN PEDIATRIC BOTTLE Blood Culture adequate volume  Final   Culture  Setup Time   Final    GRAM POSITIVE RODS IN PEDIATRIC BOTTLE CRITICAL VALUE NOTED.  VALUE IS CONSISTENT WITH PREVIOUSLY REPORTED AND CALLED VALUE.    Culture NO GROWTH 2 DAYS  Final   Report Status PENDING  Incomplete  Blood Culture (routine x 2)     Status: None (Preliminary result)   Collection Time: 02/20/17 12:08 PM  Result Value Ref Range Status   Specimen Description BLOOD RIGHT ANTECUBITAL  Final   Special Requests   Final    BOTTLES DRAWN AEROBIC AND ANAEROBIC Blood Culture adequate volume   Culture  Setup Time   Final    GRAM POSITIVE COCCI IN CLUSTERS AEROBIC BOTTLE ONLY GRAM POSITIVE RODS IN BOTH AEROBIC AND ANAEROBIC BOTTLES CRITICAL RESULT CALLED TO, READ BACK BY AND VERIFIED WITH: Alvino Chapel.D. 10:40 02/21/17 ([email or web address])    Culture   Final    GRAM POSITIVE  COCCI GRAM POSITIVE RODS CULTURE REINCUBATED FOR BETTER GROWTH    Report Status PENDING  Incomplete  Blood Culture ID Panel (Reflexed)     Status: Abnormal   Collection Time: 02/20/17 12:08 PM  Result Value Ref Range Status   Enterococcus species NOT DETECTED NOT DETECTED Final   Listeria monocytogenes NOT DETECTED NOT DETECTED Final   Staphylococcus species DETECTED (A) NOT DETECTED Final    Comment: Methicillin (oxacillin) resistant coagulase negative staphylococcus. Possible blood culture contaminant (unless isolated from more than one blood culture draw or clinical case suggests pathogenicity). No antibiotic treatment is indicated for blood  culture contaminants. CRITICAL RESULT CALLED TO, READ BACK BY AND VERIFIED WITH: Alvino Chapel.D. 10:40 02/21/17 ([email or web address])    Staphylococcus aureus NOT DETECTED NOT DETECTED Final   Methicillin resistance DETECTED (A) NOT DETECTED Final    Comment: CRITICAL RESULT CALLED TO, READ BACK BY AND VERIFIED WITH: EDaisy Lazar.D. 10:40 02/21/17 ([email or web address])    Streptococcus species NOT DETECTED NOT DETECTED Final   Streptococcus agalactiae NOT DETECTED NOT DETECTED Final   Streptococcus pneumoniae NOT DETECTED NOT DETECTED Final   Streptococcus pyogenes NOT DETECTED NOT DETECTED Final   Acinetobacter baumannii NOT DETECTED NOT DETECTED Final   Enterobacteriaceae species NOT DETECTED NOT DETECTED Final   Enterobacter cloacae complex NOT DETECTED NOT DETECTED Final   Escherichia coli NOT DETECTED NOT DETECTED Final   Klebsiella oxytoca NOT DETECTED NOT DETECTED Final   Klebsiella pneumoniae NOT DETECTED NOT DETECTED Final   Proteus species NOT DETECTED NOT DETECTED Final   Serratia marcescens NOT DETECTED NOT DETECTED Final   Haemophilus influenzae NOT DETECTED NOT DETECTED Final   Neisseria meningitidis NOT DETECTED NOT DETECTED Final   Pseudomonas aeruginosa NOT DETECTED NOT DETECTED Final   Candida albicans NOT DETECTED NOT DETECTED Final    Candida glabrata NOT DETECTED NOT DETECTED Final   Candida krusei NOT DETECTED NOT DETECTED Final   Candida parapsilosis NOT DETECTED NOT DETECTED Final   Candida tropicalis NOT DETECTED NOT DETECTED Final  Respiratory Panel by PCR     Status: None   Collection Time: 02/21/17  1:10 PM  Result Value Ref Range Status   Adenovirus NOT DETECTED NOT DETECTED Final   Coronavirus 229E NOT DETECTED NOT DETECTED Final   Coronavirus HKU1 NOT DETECTED NOT DETECTED Final   Coronavirus NL63 NOT DETECTED NOT DETECTED Final   Coronavirus OC43 NOT DETECTED NOT DETECTED Final   Metapneumovirus NOT DETECTED NOT DETECTED Final   Rhinovirus / Enterovirus NOT DETECTED NOT DETECTED Final   Influenza A NOT DETECTED NOT DETECTED Final   Influenza B NOT DETECTED NOT DETECTED Final   Parainfluenza Virus 1 NOT DETECTED NOT DETECTED Final   Parainfluenza Virus 2 NOT DETECTED NOT DETECTED Final   Parainfluenza Virus 3 NOT DETECTED NOT DETECTED Final   Parainfluenza Virus 4 NOT DETECTED NOT DETECTED Final   Respiratory Syncytial Virus NOT DETECTED NOT DETECTED Final   Bordetella pertussis NOT DETECTED NOT DETECTED Final   Chlamydophila pneumoniae NOT DETECTED NOT DETECTED Final   Mycoplasma pneumoniae NOT DETECTED NOT DETECTED Final     Radiology Studies: No results found.  Scheduled Meds: . amiodarone  200 mg Oral NOW  . [START ON 02/23/2017] amiodarone  400 mg Oral Daily  . amLODipine  5 mg Oral Daily  . aspirin EC  81 mg Oral Daily  . atorvastatin  40 mg Oral q1800  . budesonide (PULMICORT) nebulizer solution  0.25 mg Nebulization BID  . divalproex  750 mg Oral Daily  . enoxaparin (LOVENOX) injection  40 mg Subcutaneous Q24H  . ipratropium-albuterol  3 mL Nebulization BID  . levETIRAcetam  500 mg Oral BID  . methylPREDNISolone (SOLU-MEDROL) injection  40 mg Intravenous Q12H  . oxybutynin  5 mg Oral Daily  . pantoprazole  40 mg Oral Daily   Continuous Infusions: . ceFEPime (MAXIPIME) IV Stopped  (02/22/17 1230)  . vancomycin Stopped (02/22/17 1430)     LOS: 2 days   Marylu Lund, MD [facility] Pager [phone]  If 7PM-7AM, please contact night-coverage [email or web address] Password [email or web address] 02/22/2017, 4:16 PM

## 2017-02-22 NOTE — Progress Notes (Signed)
SATURATION QUALIFICATIONS: (This note is used to comply with regulatory documentation for home oxygen)  Patient Saturations on Room Air at Rest = 95%  Patient Saturations on Room Air while Ambulating = 92%  Patient Saturations on N/A Liters of oxygen while Ambulating = N/A%  Please briefly explain why patient needs home oxygen: Pt does not require oxygen.  [employer] PT [phone]

## 2017-02-22 NOTE — Evaluation (Signed)
Physical Therapy Evaluation Patient Details Name: Patrick Holt MRN: [id number] DOB: 07-24-1950 Today's Date: 02/22/2017   History of Present Illness  Pt adm with acute respiratory failure (likely a combination of possible COPD exacerbation and pneumonia) and acute encephalopathy (likely from hypercarbia and elevated ammonia levels). PMH -bipolar disorder noncompliant with meds, PAF on amiodarone, COPD, nonhealing right hand fx since 2017 in the context of recurrent falls, dermatomyositis, prediabetes, anemia, h/o narcotic dependence, HTN, seizure disorder on AEDs. frequent falls at home with repeated calls to EMS to get him up.  Clinical Impression  Pt has been seen repeatedly over past several months by PT. Each time recommend pt DC to SNF which pt refuses. Again pt refusing SNF so will continue HHPT. Pt with repeatedly falls at home. Pt denies he had repeated falls in Dec (20,21,22) which resulted in an admission. Able to maintain SpO2 on RA with amb of 92%.    Follow Up Recommendations Home health PT(because pt adamantly refuses SNF)    Equipment Recommendations  None recommended by PT    Recommendations for Other Services       Precautions / Restrictions Precautions Precautions: Fall Restrictions Weight Bearing Restrictions: No      Mobility  Bed Mobility Overal bed mobility: Modified Independent             General bed mobility comments: Incr time and HOB elevated  Transfers Overall transfer level: Needs assistance Equipment used: Rolling walker (2 wheeled) Transfers: Sit to/from Stand Sit to Stand: Supervision         General transfer comment: supervision for safety  Ambulation/Gait Ambulation/Gait assistance: Supervision Ambulation Distance (Feet): 60 Feet Assistive device: Rolling walker (2 wheeled) Gait Pattern/deviations: Step-through pattern;Decreased stride length;Trunk flexed Gait velocity: decr Gait velocity interpretation: Below normal speed  for age/gender General Gait Details: Slightly unsteady. No overt loss of balance with walker. Assist for safety  Stairs            Wheelchair Mobility    Modified Rankin (Stroke Patients Only)       Balance Overall balance assessment: Needs assistance Sitting-balance support: No upper extremity supported;Feet supported Sitting balance-Leahy Scale: Fair     Standing balance support: No upper extremity supported Standing balance-Leahy Scale: Fair                               Pertinent Vitals/Pain Pain Assessment: No/denies pain    Home Living Family/patient expects to be discharged to:: Private residence Living Arrangements: Alone   Type of Home: Apartment Home Access: Elevator     Home Layout: One level Home Equipment: Environmental consultant - 2 wheels;Cane - single point;Electric scooter Additional Comments: Ship broker    Prior Function Level of Independence: Secondary school teacher / Transfers Assistance Needed: amb short distances with RW and uses scooter for longer distances           Hand Dominance   Dominant Hand: Right    Extremity/Trunk Assessment   Upper Extremity Assessment Upper Extremity Assessment: Generalized weakness    Lower Extremity Assessment Lower Extremity Assessment: Generalized weakness       Communication   Communication: No difficulties  Cognition Arousal/Alertness: Awake/alert Behavior During Therapy: Anxious Overall Cognitive Status: No family/caregiver present to determine baseline cognitive functioning Area of Impairment: Safety/judgement;Memory                     Memory: Decreased short-term memory  Safety/Judgement: Decreased awareness of safety;Decreased awareness of deficits     General Comments: Pt adamently denies that he was hospitalized here on 12/22 and that he had called EMS three straight days at that time. Pt states I don't know what I'm talking about      General  Comments      Exercises     Assessment/Plan    PT Assessment All further PT needs can be met in the next venue of care  PT Problem List Decreased strength;Decreased activity tolerance;Decreased balance;Decreased mobility;Decreased cognition;Decreased safety awareness       PT Treatment Interventions      PT Goals (Current goals can be found in the Care Plan section)  Acute Rehab PT Goals PT Goal Formulation: All assessment and education complete, DC therapy    Frequency     Barriers to discharge Decreased caregiver support      Co-evaluation               AM-PAC PT "6 Clicks" Daily Activity  Outcome Measure Difficulty turning over in bed (including adjusting bedclothes, sheets and blankets)?: A Little Difficulty moving from lying on back to sitting on the side of the bed? : A Little Difficulty sitting down on and standing up from a chair with arms (e.g., wheelchair, bedside commode, etc,.)?: A Little Help needed moving to and from a bed to chair (including a wheelchair)?: A Little Help needed walking in hospital room?: A Little Help needed climbing 3-5 steps with a railing? : A Lot 6 Click Score: 17    End of Session Equipment Utilized During Treatment: Gait belt Activity Tolerance: Patient tolerated treatment well Patient left: in bed;with call bell/phone within reach;with bed alarm set Nurse Communication: Mobility status PT Visit Diagnosis: Repeated falls (R29.6);Unsteadiness on feet (R26.81)    Time: 1045-1100 PT Time Calculation (min) (ACUTE ONLY): 15 min   Charges:   PT Evaluation $PT Eval Moderate Complexity: 1 Mod     PT G Codes:        [facility] PT [place] 02/22/2017, 11:13 AM

## 2017-02-22 NOTE — Care Management Note (Signed)
Case Management Note Marvetta Gibbons RN, BSN Unit 4E-Case Manager [phone]  Patient Details  Name: Patrick Holt MRN: [id number] Date of Birth: [birth year]  Subjective/Objective:   Pt admitted with Acute Resp. Failure with hypoxia                 Action/Plan: PTA pt lived at home alone - resides at the [employer]- pt active with [facility] for HHRN/PT/OT- has refused SNF placement in past and per PT continues to refuse on this admit - remains agreeable to continued [facility] services- will need resumption orders on discharge for HHRN/PT/OT- have spoken with Adacia with [facility] who will f/u for transition back home.   Expected Discharge Date:                  Expected Discharge Plan:  [place]  In-House Referral:     Discharge planning Services  CM Consult  Post Acute Care Choice:  Home Health, Resumption of Svcs/PTA Provider Choice offered to:  Patient  DME Arranged:    DME Agency:     HH Arranged:  RN, PT, OT HH Agency:  [facility]  Status of Service:  In process, will continue to follow  If discussed at Long Length of Stay Meetings, dates discussed:    Discharge Disposition:   Additional Comments:  Dawayne Patricia, RN 02/22/2017, 12:39 PM

## 2017-02-23 DIAGNOSIS — G9340 Encephalopathy, unspecified: Secondary | ICD-10-CM

## 2017-02-23 DIAGNOSIS — J9601 Acute respiratory failure with hypoxia: Secondary | ICD-10-CM

## 2017-02-23 DIAGNOSIS — J9602 Acute respiratory failure with hypercapnia: Secondary | ICD-10-CM

## 2017-02-23 DIAGNOSIS — J189 Pneumonia, unspecified organism: Secondary | ICD-10-CM

## 2017-02-23 LAB — GLUCOSE, CAPILLARY
GLUCOSE-CAPILLARY: 118 mg/dL — AB (ref 65–99)
GLUCOSE-CAPILLARY: 133 mg/dL — AB (ref 65–99)
GLUCOSE-CAPILLARY: 146 mg/dL — AB (ref 65–99)

## 2017-02-23 MED ORDER — ZITHROMAX Z-PAK 250 MG PO TABS: 250.0000 mg | ORAL_TABLET | Freq: Every day | ORAL | 0 refills | Status: DC

## 2017-02-23 MED ORDER — FUROSEMIDE 20 MG PO TABS: 20.0000 mg | ORAL_TABLET | Freq: Every day | ORAL | 0 refills | Status: DC

## 2017-02-23 MED ORDER — POTASSIUM CHLORIDE ER 20 MEQ PO TBCR: 20.0000 meq | EXTENDED_RELEASE_TABLET | Freq: Every day | ORAL | 0 refills | Status: DC

## 2017-02-23 MED ORDER — AMIODARONE HCL 200 MG PO TABS
200.0000 mg | ORAL_TABLET | Freq: Every day | ORAL | Status: DC
  Administered 2017-02-23: 200 mg via ORAL
  Filled 2017-02-23: qty 1

## 2017-02-23 MED ORDER — PREDNISONE 20 MG PO TABS: 20.0000 mg | ORAL_TABLET | Freq: Every day | ORAL | 0 refills | Status: DC

## 2017-02-23 MED ORDER — FUROSEMIDE 10 MG/ML IJ SOLN: 40.0000 mg | Freq: Once | INTRAMUSCULAR | Status: DC

## 2017-02-23 MED ORDER — PREDNISONE 20 MG PO TABS
50.0000 mg | ORAL_TABLET | Freq: Every day | ORAL | Status: DC
  Administered 2017-02-23: 09:00:00 50 mg via ORAL
  Filled 2017-02-23: qty 2

## 2017-02-23 MED ORDER — FUROSEMIDE 10 MG/ML IJ SOLN
40.0000 mg | Freq: Once | INTRAMUSCULAR | Status: AC
  Administered 2017-02-23: 40 mg via INTRAVENOUS
  Filled 2017-02-23: qty 4

## 2017-02-23 NOTE — Discharge Summary (Addendum)
Physician Discharge Summary  Patrick Holt ZHY:[id number] DOB: 09/20/1950 DOA: 02/20/2017  PCP: [facility]  Admit date: 02/20/2017 Discharge date: 02/23/2017  Time spent: 45 minutes  Recommendations for Outpatient Follow-up:  PCP in one week, please check BMP at follow-up he was started on low-dose Lasix and potassium  Discharge Diagnoses:  Principal Problem:   Acute respiratory failure with hypoxia and hypercapnia ([place]) Active Problems:   COPD (chronic obstructive pulmonary disease) (HCC)   Rheumatoid arthritis (HCC)   Seizures ([place])   Schizoaffective disorder, bipolar type ([place])   Pulmonary artery hypertension (HCC)   PAF (paroxysmal atrial fibrillation) ([place])   Acute encephalopathy   Discharge Condition: Stable Diet recommendation: Low-sodium, heart healthy  Filed Weights   02/20/17 1130 02/20/17 2020  Weight: 106.6 kg (235 lb) 107 kg (235 lb 14.3 oz)    History of present illness:  67 y.o.malewithhistory of COPD, hypertension, bipolar disorder, seizure, dermatomyositis, paroxysmal atrial fibrillation, previous stroke was brought to the ER after patient had a fall at home and was unable to get up from the fall. Patient states he fell yesterday and was unable to communicate until he reached the phone today and called EMS.    Hospital Course:   1. Acute respiratory failure with hypoxia and hypercarbia - Due to COPD exacerbation, initially required BiPAP, improved with IV steroids, and nebulizations and oxygen, -Now improved, transitioned to prednisone taper -Blood cultures grew gram-positive rods and coagulase-negative Staphylococcus which is consistent with contamination -Clinically also appeared slightly volume overloaded on my evaluation today, hence gave him 20 mg of IV Lasix with good diuresis and discharged him on 20 mg by mouth Lasix per day along with potassium -Last echocardiogram in November 2018 noted normal ejection fraction and  grade 1 diastolic dysfunction  -He needs labs in 1 week to check BMP  2. Acute metabolic encephalopathy secondary to hypercarbia -Resolved  3. History of seizures -Stable, continued on home dose of Keppra  4. History of bipolar disorder on Depakote -Stable  5. Paroxysmal atrial fibrillation  -Long history of same, --briefly was in rapid A. fib in the setting of respiratory failure, heart rate since improved and rate controlled - continue amiodarone - Reportedly not on anticoagulation due to recurrent falls and seizure history  -Follow-up with cardiology   6. 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  -Stable, follow-up with rheumatology   7. Hypertension, stable continue amlodipine   8. Chronic right upper extremity fracture  -Follow-up with orthopedics      Discharge Exam: Vitals:   02/23/17 0806 02/23/17 1201  BP: 135/79 123/72  Pulse: 61 66  Resp: 16 15  Temp: 97.8 F (36.6 C) 98 F (36.7 C)  SpO2: 94% 92%    General: AAOx3 Cardiovascular: S1S2/RRR Respiratory: CTAB  Discharge Instructions    Allergies as of 02/23/2017      Reactions   Immune Globulins Other (See Comments)   Acute renal failure   Ciprofloxacin Swelling   Flagyl [metronidazole] Swelling   Lisinopril Diarrhea   Sulfa Antibiotics Other (See Comments)   blisters   Morphine And Related    Does not help with pain   Requip [ropinirole Hcl]    constipation   Betamethasone Dipropionate Other (See Comments)   Unknown   Bupropion Hcl Other (See Comments)   Unknown   Clobetasol Other (See Comments)   Unknown   Codeine Other (See Comments)   Does not help with pain   Escitalopram Oxalate Other (See  Comments)   Unknown   Fluoxetine Hcl Other (See Comments)   Unknown   Furosemide Other (See Comments)   Unknown   Meclizine Rash   Paroxetine Other (See Comments)   Unknown   Penicillins Other (See Comments)   Unknown reaction Has patient had a PCN reaction causing  immediate rash, facial/tongue/throat swelling, SOB or lightheadedness with hypotension: unknown Has patient had a PCN reaction causing severe rash involving mucus membranes or skin necrosis: unknown Has patient had a PCN reaction that required hospitalization unknown Has patient had a PCN reaction occurring within the last 10 years: unknown If all of the above answers are "NO", then may proceed with Cephalosporin use. Unknown Unknown reactio   Tacrolimus Other (See Comments)   Unknown   Tetanus Toxoid Other (See Comments)   Unknown   Tuberculin Purified Protein Derivative Other (See Comments)   Unknown      Medication List    TAKE these medications   ALPRAZolam 1 MG tablet Commonly known as:  XANAX Take 1 mg by mouth 3 (three) times daily.   amiodarone 200 MG tablet Commonly known as:  PACERONE Take 1 tablet (200 mg total) by mouth daily.   amLODipine 5 MG tablet Commonly known as:  NORVASC Take 1 tablet (5 mg total) by mouth daily.   aspirin EC 81 MG tablet Take 1 tablet (81 mg total) by mouth daily.   atorvastatin 40 MG tablet Commonly known as:  LIPITOR Take 1 tablet (40 mg total) by mouth daily.   budesonide-formoterol 160-4.5 MCG/ACT inhaler Commonly known as:  SYMBICORT Inhale 2 puffs into the lungs 2 (two) times daily.   divalproex 250 MG 24 hr tablet Commonly known as:  DEPAKOTE ER Take 3 tablets (750 mg total) by mouth daily.   ergocalciferol 50000 units capsule Commonly known as:  VITAMIN D2 Take 50,000 Units by mouth once a week.   esomeprazole 40 MG capsule Commonly known as:  NEXIUM Take 40 mg by mouth daily.   furosemide 20 MG tablet Commonly known as:  LASIX Take 1 tablet (20 mg total) by mouth daily.   ipratropium-albuterol 0.5-2.5 (3) MG/3ML Soln Commonly known as:  DUONEB Inhale 3 mLs into the lungs every 6 (six) hours as needed. What changed:  reasons to take this   levETIRAcetam 500 MG tablet Commonly known as:  KEPPRA Take 500 mg by  mouth 2 (two) times daily.   loperamide 2 MG capsule Commonly known as:  IMODIUM Take 2 mg by mouth as needed for diarrhea or loose stools.   multivitamin with minerals Tabs tablet Take 1 tablet by mouth daily.   oxybutynin 5 MG tablet Commonly known as:  DITROPAN Take 15 mg by mouth daily.   Potassium Chloride ER 20 MEQ Tbcr Take 20 mEq by mouth daily.   predniSONE 20 MG tablet Commonly known as:  DELTASONE Take 1-2 tablets (20-40 mg total) by mouth daily with breakfast. Take 40mg  for 2days then 20mg  for 2days then STOP Start taking on:  02/24/2017   traMADol 50 MG tablet Commonly known as:  ULTRAM Take 50 mg by mouth every 6 (six) hours as needed for moderate pain.   ZITHROMAX Z-PAK 250 MG tablet Generic drug:  azithromycin Take 1 tablet (250 mg total) by mouth daily. For 2days      Allergies  Allergen Reactions  . Immune Globulins Other (See Comments)    Acute renal failure  . Ciprofloxacin Swelling  . Flagyl [Metronidazole] Swelling  . Lisinopril Diarrhea  .  Sulfa Antibiotics Other (See Comments)    blisters  . Morphine And Related     Does not help with pain  . Requip [Ropinirole Hcl]     constipation  . Betamethasone Dipropionate Other (See Comments)    Unknown  . Bupropion Hcl Other (See Comments)    Unknown  . Clobetasol Other (See Comments)    Unknown  . Codeine Other (See Comments)    Does not help with pain  . Escitalopram Oxalate Other (See Comments)    Unknown  . Fluoxetine Hcl Other (See Comments)    Unknown  . Furosemide Other (See Comments)    Unknown  . Meclizine Rash  . Paroxetine Other (See Comments)    Unknown  . Penicillins Other (See Comments)    Unknown reaction Has patient had a PCN reaction causing immediate rash, facial/tongue/throat swelling, SOB or lightheadedness with hypotension: unknown Has patient had a PCN reaction causing severe rash involving mucus membranes or skin necrosis: unknown Has patient had a PCN reaction that  required hospitalization unknown Has patient had a PCN reaction occurring within the last 10 years: unknown If all of the above answers are "NO", then may proceed with Cephalosporin use. Unknown Unknown reactio  . Tacrolimus Other (See Comments)    Unknown  . Tetanus Toxoid Other (See Comments)    Unknown  . Tuberculin Purified Protein Derivative Other (See Comments)    Unknown      The results of significant diagnostics from this hospitalization (including imaging, microbiology, ancillary and laboratory) are listed below for reference.    Significant Diagnostic Studies: Dg Chest 2 View  Result Date: 02/05/2017 CLINICAL DATA:  Altered mental status after fall today. EXAM: CHEST  2 VIEW COMPARISON:  Radiograph of January 13, 2017. FINDINGS: Stable cardiomegaly. No pneumothorax is noted. Mild left basilar atelectasis is noted. Mild right pleural effusion is noted with associated atelectasis. Bony thorax is unremarkable. IMPRESSION: Mild bibasilar subsegmental atelectasis with mild right pleural effusion. Electronically Signed   By: Marijo Conception, M.D.   On: 02/05/2017 10:52   Dg Lumbar Spine Complete  Result Date: 02/05/2017 CLINICAL DATA:  Altered mental status after fall today. EXAM: LUMBAR SPINE - COMPLETE 4+ VIEW COMPARISON:  Radiographs of December 17, 2016. FINDINGS: No fracture or spondylolisthesis is noted. Moderate degenerative disc disease is noted at L2-3 with anterior osteophyte formation. Mild degenerative disc disease is noted at L1 to and L3-4. Atherosclerosis of thoracic aorta is noted. IMPRESSION: Multilevel degenerative disc disease. No acute abnormality seen the lumbar spine. Electronically Signed   By: Marijo Conception, M.D.   On: 02/05/2017 10:54   Dg Wrist Complete Right  Result Date: 02/05/2017 CLINICAL DATA:  Right wrist deformity after fall today. EXAM: RIGHT WRIST - COMPLETE 3+ VIEW COMPARISON:  Radiographs of January 15, 2017. FINDINGS: Stable widening of  scapholunate space is noted. Continued presence of distal first metacarpal fracture is noted. Stable deformity of distal radius and ulna is noted consistent with old healed fractures. No new abnormality is noted. IMPRESSION: Stable distal first metacarpal fracture as well as stable deformity of distal right radius and ulna consistent with old healed fractures. No new abnormality is noted. Electronically Signed   By: Marijo Conception, M.D.   On: 02/05/2017 11:00   Ct Head Wo Contrast  Result Date: 02/20/2017 CLINICAL DATA:  Altered mental status, fell this morning, denies striking head, became hypotensive normal larger and disoriented after he was felt to a sitting position, history atrial  fibrillation, prior subarachnoid hemorrhage, collagen vascular disease COPD sites, GERD hypertension, ischemic heart disease post MI, type II diabetes mellitus EXAM: CT HEAD WITHOUT CONTRAST TECHNIQUE: Contiguous axial images were obtained from the base of the skull through the vertex without intravenous contrast. Sagittal and coronal MPR images reconstructed from axial data set. COMPARISON:  02/05/2017 FINDINGS: Brain: Generalized atrophy. Normal ventricular morphology. No midline shift or mass effect. Small vessel chronic ischemic changes of deep cerebral white matter. No intracranial hemorrhage, mass lesion, evidence of acute infarction, or extra-axial fluid collection. Vascular: Atherosclerotic calcification of internal carotid arteries at skullbase Skull: Demineralized but intact Sinuses/Orbits: Clear Other: N/A IMPRESSION: Atrophy with small vessel chronic ischemic changes of deep cerebral white matter. No acute intracranial abnormalities. Electronically Signed   By: Lavonia Dana M.D.   On: Feb 21, 202019 15:19   Ct Head Wo Contrast  Result Date: 02/05/2017 CLINICAL DATA:  Altered mental status beginning this morning. 01/13/2017 EXAM: CT HEAD WITHOUT CONTRAST TECHNIQUE: Contiguous axial images were obtained from the base of  the skull through the vertex without intravenous contrast. COMPARISON:  None. FINDINGS: Brain: No evidence of acute infarction, hemorrhage, hydrocephalus, extra-axial collection or mass lesion/mass effect. Ventricular sulcal enlargement is noted consistent with mild atrophy. There are patchy areas of white matter hypoattenuation consistent with mild chronic microvascular ischemic change. A small old lacune infarct is noted in the pons centrally. These findings are stable. Vascular: No hyperdense vessel or unexpected calcification. Skull: Normal. Negative for fracture or focal lesion. Sinuses/Orbits: Globes and orbits are unremarkable. Sinuses and mastoid air cells are clear. Other: None. IMPRESSION: 1. No acute intracranial abnormalities. 2. Mild atrophy and chronic microvascular ischemic change. Stable appearance from the recent prior exam. Electronically Signed   By: Lajean Manes M.D.   On: 02/05/2017 10:10   Ct Angio Chest Pe W Or Wo Contrast  Result Date: 02/09/2017 CLINICAL DATA:  Acute onset of generalized chest pain and shortness of breath. EXAM: CT ANGIOGRAPHY CHEST WITH CONTRAST TECHNIQUE: Multidetector CT imaging of the chest was performed using the standard protocol during bolus administration of intravenous contrast. Multiplanar CT image reconstructions and MIPs were obtained to evaluate the vascular anatomy. CONTRAST:  161mL ISOVUE-370 IOPAMIDOL (ISOVUE-370) INJECTION 76% COMPARISON:  Chest radiograph performed 02/08/2017 FINDINGS: Cardiovascular: There is no evidence of significant pulmonary embolus. The heart is normal in size. Scattered coronary artery calcifications are seen. Mild calcification is noted along the thoracic aorta and proximal great vessels. Mediastinum/Nodes: No mediastinal lymphadenopathy is seen. No pericardial effusion is identified. The visualized portions of the thyroid gland are unremarkable. No axillary lymphadenopathy is appreciated. Mild tracheobronchomalacia is noted.  Lungs/Pleura: Small bilateral pleural effusions are noted. There is partial consolidation of both lower lung lobes, which may reflect atelectasis or pneumonia. Mild interstitial prominence is noted. No pneumothorax is seen. Upper Abdomen: The visualized portions of the liver and spleen are unremarkable. Minimal soft tissue inflammation about the gallbladder is nonspecific. The visualized portions of the adrenal glands are within normal limits. Musculoskeletal: No acute osseous abnormalities are identified. The visualized musculature is unremarkable in appearance. Review of the MIP images confirms the above findings. IMPRESSION: 1. No evidence of significant pulmonary embolus. 2. Small bilateral pleural effusions. Partial consolidation of both lower lung lobes, which may reflect atelectasis or pneumonia. Mild interstitial prominence noted. 3. Mild tracheobronchomalacia. 4. Scattered coronary artery calcifications seen. 5. Minimal nonspecific soft tissue inflammation about the gallbladder. Electronically Signed   By: Garald Balding M.D.   On: 02/09/2017 22:38   Dg  Chest Port 1 View  Result Date: 02/20/2017 CLINICAL DATA:  Altered mental status, fall, hypotension EXAM: PORTABLE CHEST 1 VIEW COMPARISON:  CTA chest dated 02/09/2017 FINDINGS: Mild bilateral lower lobe opacities, atelectasis versus pneumonia. Small right pleural effusion. Cardiomegaly with pulmonary vascular congestion. No frank interstitial edema. No pneumothorax. IMPRESSION: Mild bilateral lower lobe opacities, atelectasis versus pneumonia. Small right pleural effusion. Cardiomegaly pulmonary vascular congestion. No frank interstitial edema. Electronically Signed   By: Julian Hy M.D.   On: Mar 18, 202019 12:21   Dg Chest Port 1 View  Result Date: 02/08/2017 CLINICAL DATA:  COPD.  Fall with weakness.  Atrial fibrillation. EXAM: PORTABLE CHEST 1 VIEW COMPARISON:  02/05/2017. FINDINGS: The heart is enlarged. Subsegmental atelectatic change the  bases without definite consolidation or frank edema. Small BILATERAL effusions are likely. There is no pneumothorax. IMPRESSION: Similar appearance to priors. Cardiomegaly. Bibasilar subsegmental atelectasis. Electronically Signed   By: Staci Righter M.D.   On: 02/08/2017 08:24   Dg Hand Complete Right  Result Date: 02/05/2017 CLINICAL DATA:  Right wrist deformity after fall today. EXAM: RIGHT HAND - COMPLETE 3+ VIEW COMPARISON:  Radiograph of January 15, 2017. FINDINGS: Continued presence of moderately displaced fracture involving distal portion of first metacarpal. Stable widening of scapholunate space is noted. No new fracture or dislocation is noted. IMPRESSION: Continued presence of moderately displaced distal first metacarpal fracture. Stable widening of scapholunate space is noted suggesting ligamentous injury. No new abnormality is noted. Electronically Signed   By: Marijo Conception, M.D.   On: 02/05/2017 10:55    Microbiology: Recent Results (from the past 240 hour(s))  Blood Culture (routine x 2)     Status: None (Preliminary result)   Collection Time: 02/20/17 12:08 PM  Result Value Ref Range Status   Specimen Description BLOOD LEFT ANTECUBITAL  Final   Special Requests IN PEDIATRIC BOTTLE Blood Culture adequate volume  Final   Culture  Setup Time   Final    GRAM POSITIVE RODS IN PEDIATRIC BOTTLE CRITICAL VALUE NOTED.  VALUE IS CONSISTENT WITH PREVIOUSLY REPORTED AND CALLED VALUE.    Culture GRAM POSITIVE RODS  Final   Report Status PENDING  Incomplete  Blood Culture (routine x 2)     Status: None (Preliminary result)   Collection Time: 02/20/17 12:08 PM  Result Value Ref Range Status   Specimen Description BLOOD RIGHT ANTECUBITAL  Final   Special Requests   Final    BOTTLES DRAWN AEROBIC AND ANAEROBIC Blood Culture adequate volume   Culture  Setup Time   Final    GRAM POSITIVE COCCI IN CLUSTERS AEROBIC BOTTLE ONLY GRAM POSITIVE RODS IN BOTH AEROBIC AND ANAEROBIC  BOTTLES CRITICAL RESULT CALLED TO, READ BACK BY AND VERIFIED WITH: Alvino Chapel.D. 10:40 02/21/17 ([email or web address])    Culture   Final    GRAM POSITIVE COCCI GRAM POSITIVE RODS CULTURE REINCUBATED FOR BETTER GROWTH    Report Status PENDING  Incomplete  Blood Culture ID Panel (Reflexed)     Status: Abnormal   Collection Time: 02/20/17 12:08 PM  Result Value Ref Range Status   Enterococcus species NOT DETECTED NOT DETECTED Final   Listeria monocytogenes NOT DETECTED NOT DETECTED Final   Staphylococcus species DETECTED (A) NOT DETECTED Final    Comment: Methicillin (oxacillin) resistant coagulase negative staphylococcus. Possible blood culture contaminant (unless isolated from more than one blood culture draw or clinical case suggests pathogenicity). No antibiotic treatment is indicated for blood  culture contaminants. CRITICAL RESULT CALLED TO, READ BACK BY AND  VERIFIED WITH: Alvino Chapel.D. 10:40 02/21/17 ([email or web address])    Staphylococcus aureus NOT DETECTED NOT DETECTED Final   Methicillin resistance DETECTED (A) NOT DETECTED Final    Comment: CRITICAL RESULT CALLED TO, READ BACK BY AND VERIFIED WITH: EDaisy Lazar.D. 10:40 02/21/17 ([email or web address])    Streptococcus species NOT DETECTED NOT DETECTED Final   Streptococcus agalactiae NOT DETECTED NOT DETECTED Final   Streptococcus pneumoniae NOT DETECTED NOT DETECTED Final   Streptococcus pyogenes NOT DETECTED NOT DETECTED Final   Acinetobacter baumannii NOT DETECTED NOT DETECTED Final   Enterobacteriaceae species NOT DETECTED NOT DETECTED Final   Enterobacter cloacae complex NOT DETECTED NOT DETECTED Final   Escherichia coli NOT DETECTED NOT DETECTED Final   Klebsiella oxytoca NOT DETECTED NOT DETECTED Final   Klebsiella pneumoniae NOT DETECTED NOT DETECTED Final   Proteus species NOT DETECTED NOT DETECTED Final   Serratia marcescens NOT DETECTED NOT DETECTED Final   Haemophilus influenzae NOT DETECTED NOT DETECTED Final   Neisseria meningitidis  NOT DETECTED NOT DETECTED Final   Pseudomonas aeruginosa NOT DETECTED NOT DETECTED Final   Candida albicans NOT DETECTED NOT DETECTED Final   Candida glabrata NOT DETECTED NOT DETECTED Final   Candida krusei NOT DETECTED NOT DETECTED Final   Candida parapsilosis NOT DETECTED NOT DETECTED Final   Candida tropicalis NOT DETECTED NOT DETECTED Final  Respiratory Panel by PCR     Status: None   Collection Time: 02/21/17  1:10 PM  Result Value Ref Range Status   Adenovirus NOT DETECTED NOT DETECTED Final   Coronavirus 229E NOT DETECTED NOT DETECTED Final   Coronavirus HKU1 NOT DETECTED NOT DETECTED Final   Coronavirus NL63 NOT DETECTED NOT DETECTED Final   Coronavirus OC43 NOT DETECTED NOT DETECTED Final   Metapneumovirus NOT DETECTED NOT DETECTED Final   Rhinovirus / Enterovirus NOT DETECTED NOT DETECTED Final   Influenza A NOT DETECTED NOT DETECTED Final   Influenza B NOT DETECTED NOT DETECTED Final   Parainfluenza Virus 1 NOT DETECTED NOT DETECTED Final   Parainfluenza Virus 2 NOT DETECTED NOT DETECTED Final   Parainfluenza Virus 3 NOT DETECTED NOT DETECTED Final   Parainfluenza Virus 4 NOT DETECTED NOT DETECTED Final   Respiratory Syncytial Virus NOT DETECTED NOT DETECTED Final   Bordetella pertussis NOT DETECTED NOT DETECTED Final   Chlamydophila pneumoniae NOT DETECTED NOT DETECTED Final   Mycoplasma pneumoniae NOT DETECTED NOT DETECTED Final     Labs: Basic Metabolic Panel: Recent Labs  Lab 02/20/17 1211 02/20/17 2138 02/21/17 0212  NA 140  --  143  K 3.8  --  4.9  CL 108  --  112*  CO2 26  --  26  GLUCOSE 108*  --  157*  BUN 9  --  8  CREATININE 1.30* 1.51* 0.92  CALCIUM 8.4*  --  8.1*   Liver Function Tests: Recent Labs  Lab 02/20/17 1211 02/21/17 0212  AST 20 26  ALT 18 13*  ALKPHOS 73 68  BILITOT 0.8 1.2  PROT 5.8* 5.5*  ALBUMIN 2.9* 2.5*   No results for input(s): LIPASE, AMYLASE in the last 168 hours. Recent Labs  Lab 02/20/17 1158 02/21/17 0823   AMMONIA 39* 39*   CBC: Recent Labs  Lab 02/20/17 1211 02/20/17 2138 02/21/17 0212  WBC 6.5 4.0 3.4*  NEUTROABS 3.1  --   --   HGB 12.4* 12.4* 12.3*  HCT 40.9 41.5 41.4  MCV 85.4 86.1 86.4  PLT 239 245 239   Cardiac Enzymes: Recent Labs  Lab 02/20/17 2138  CKTOTAL 134  TROPONINI <0.03   BNP: BNP (last 3 results) No results for input(s): BNP in the last 8760 hours.  ProBNP (last 3 results) No results for input(s): PROBNP in the last 8760 hours.  CBG: Recent Labs  Lab 02/22/17 1556 02/22/17 1946 02/23/17 0005 02/23/17 0804 02/23/17 1133  GLUCAP 158* 133* 146* 133* 118*       Signed:  Domenic Polite MD.  [facility] 02/23/2017, 12:10 PM

## 2017-02-23 NOTE — Care Management Note (Signed)
Case Management Note Marvetta Gibbons RN, BSN Unit 4E-Case Manager [phone]  Patient Details  Name: Patrick Holt MRN: [id number] Date of Birth: [birth year]  Subjective/Objective:   Pt admitted with Acute Resp. Failure with hypoxia                 Action/Plan: PTA pt lived at home alone - resides at the [employer]- pt active with [facility] for HHRN/PT/OT- has refused SNF placement in past and per PT continues to refuse on this admit - remains agreeable to continued [facility] services- will need resumption orders on discharge for HHRN/PT/OT- have spoken with Adacia with [facility] who will f/u for transition back home.   Expected Discharge Date:  02/23/17               Expected Discharge Plan:  [place]  In-House Referral:     Discharge planning Services  CM Consult  Post Acute Care Choice:  Home Health, Resumption of Svcs/PTA Provider Choice offered to:  Patient  DME Arranged:    DME Agency:     HH Arranged:  RN, PT, OT [place] Agency:  [facility]  Status of Service:  Completed, signed off  If discussed at [place] of Stay Meetings, dates discussed:    Discharge Disposition: home/home health   Additional Comments:  02/23/17- 1220- Darol Cush RN, CM- pt for d/c home today- orders have been placed to resume [place] services- have notified Ad[facility] with [facility] for resumption of [place] services.   Dawayne Patricia, RN 02/23/2017, 12:28 PM

## 2017-02-23 NOTE — Progress Notes (Signed)
Pt D/C home per MD order, D/C instructions reviewed with pt, all questions answered. Pt aware of follow up appt., IV removed and site looks clean and intact,Pt belongings and dc package that includes paper prescriptions handed to [facility] staff, pt wheeled out via stretcher. Pt verbalized understanding of discharged instructions.

## 2017-02-23 NOTE — Progress Notes (Signed)
Called [facility] for transport home - paperwork given to [employer]

## 2017-02-24 LAB — CULTURE, BLOOD (ROUTINE X 2)
SPECIAL REQUESTS: ADEQUATE
SPECIAL REQUESTS: ADEQUATE

## 2017-02-25 ENCOUNTER — Ambulatory Visit: Payer: Self-pay | Admitting: Nurse Practitioner

## 2017-03-03 ENCOUNTER — Emergency Department (HOSPITAL_COMMUNITY): Payer: Medicare Other

## 2017-03-03 ENCOUNTER — Inpatient Hospital Stay (HOSPITAL_COMMUNITY)
Admission: EM | Admit: 2017-03-03 | Discharge: 2017-03-07 | DRG: 193 | Disposition: A | Discharge status: Home Health | Payer: Medicare Other | Attending: Family Medicine | Admitting: Family Medicine

## 2017-03-03 ENCOUNTER — Encounter (HOSPITAL_COMMUNITY): Payer: Self-pay

## 2017-03-03 ENCOUNTER — Other Ambulatory Visit: Payer: Self-pay

## 2017-03-03 DIAGNOSIS — M069 Rheumatoid arthritis, unspecified: Secondary | ICD-10-CM | POA: Diagnosis present

## 2017-03-03 DIAGNOSIS — J189 Pneumonia, unspecified organism: Secondary | ICD-10-CM | POA: Diagnosis not present

## 2017-03-03 DIAGNOSIS — R296 Repeated falls: Secondary | ICD-10-CM | POA: Diagnosis present

## 2017-03-03 DIAGNOSIS — J9601 Acute respiratory failure with hypoxia: Secondary | ICD-10-CM | POA: Diagnosis present

## 2017-03-03 DIAGNOSIS — Z86010 Personal history of colonic polyps: Secondary | ICD-10-CM

## 2017-03-03 DIAGNOSIS — R4182 Altered mental status, unspecified: Secondary | ICD-10-CM | POA: Diagnosis not present

## 2017-03-03 DIAGNOSIS — G9341 Metabolic encephalopathy: Secondary | ICD-10-CM | POA: Diagnosis not present

## 2017-03-03 DIAGNOSIS — M3313 Other dermatomyositis without myopathy: Secondary | ICD-10-CM | POA: Diagnosis present

## 2017-03-03 DIAGNOSIS — Y95 Nosocomial condition: Secondary | ICD-10-CM | POA: Diagnosis present

## 2017-03-03 DIAGNOSIS — I4891 Unspecified atrial fibrillation: Secondary | ICD-10-CM | POA: Diagnosis present

## 2017-03-03 DIAGNOSIS — W19XXXA Unspecified fall, initial encounter: Secondary | ICD-10-CM | POA: Diagnosis present

## 2017-03-03 DIAGNOSIS — M3390 Dermatopolymyositis, unspecified, organ involvement unspecified: Secondary | ICD-10-CM

## 2017-03-03 DIAGNOSIS — G40909 Epilepsy, unspecified, not intractable, without status epilepticus: Secondary | ICD-10-CM | POA: Diagnosis not present

## 2017-03-03 DIAGNOSIS — F250 Schizoaffective disorder, bipolar type: Secondary | ICD-10-CM | POA: Diagnosis present

## 2017-03-03 DIAGNOSIS — Z888 Allergy status to other drugs, medicaments and biological substances status: Secondary | ICD-10-CM

## 2017-03-03 DIAGNOSIS — J410 Simple chronic bronchitis: Secondary | ICD-10-CM

## 2017-03-03 DIAGNOSIS — I480 Paroxysmal atrial fibrillation: Secondary | ICD-10-CM | POA: Diagnosis present

## 2017-03-03 DIAGNOSIS — R0902 Hypoxemia: Secondary | ICD-10-CM | POA: Diagnosis present

## 2017-03-03 DIAGNOSIS — S80211A Abrasion, right knee, initial encounter: Secondary | ICD-10-CM | POA: Diagnosis present

## 2017-03-03 DIAGNOSIS — Z9842 Cataract extraction status, left eye: Secondary | ICD-10-CM

## 2017-03-03 DIAGNOSIS — F319 Bipolar disorder, unspecified: Secondary | ICD-10-CM | POA: Diagnosis not present

## 2017-03-03 DIAGNOSIS — J449 Chronic obstructive pulmonary disease, unspecified: Secondary | ICD-10-CM | POA: Diagnosis present

## 2017-03-03 DIAGNOSIS — E785 Hyperlipidemia, unspecified: Secondary | ICD-10-CM | POA: Diagnosis present

## 2017-03-03 DIAGNOSIS — I110 Hypertensive heart disease with heart failure: Secondary | ICD-10-CM | POA: Diagnosis present

## 2017-03-03 DIAGNOSIS — E162 Hypoglycemia, unspecified: Secondary | ICD-10-CM | POA: Diagnosis not present

## 2017-03-03 DIAGNOSIS — I5032 Chronic diastolic (congestive) heart failure: Secondary | ICD-10-CM | POA: Diagnosis present

## 2017-03-03 DIAGNOSIS — Z961 Presence of intraocular lens: Secondary | ICD-10-CM | POA: Diagnosis present

## 2017-03-03 DIAGNOSIS — Z7951 Long term (current) use of inhaled steroids: Secondary | ICD-10-CM

## 2017-03-03 DIAGNOSIS — K219 Gastro-esophageal reflux disease without esophagitis: Secondary | ICD-10-CM | POA: Diagnosis present

## 2017-03-03 DIAGNOSIS — Z882 Allergy status to sulfonamides status: Secondary | ICD-10-CM

## 2017-03-03 DIAGNOSIS — I252 Old myocardial infarction: Secondary | ICD-10-CM

## 2017-03-03 DIAGNOSIS — Z79899 Other long term (current) drug therapy: Secondary | ICD-10-CM

## 2017-03-03 DIAGNOSIS — M6282 Rhabdomyolysis: Secondary | ICD-10-CM

## 2017-03-03 DIAGNOSIS — Z008 Encounter for other general examination: Secondary | ICD-10-CM

## 2017-03-03 DIAGNOSIS — R32 Unspecified urinary incontinence: Secondary | ICD-10-CM | POA: Diagnosis present

## 2017-03-03 DIAGNOSIS — S0031XA Abrasion of nose, initial encounter: Secondary | ICD-10-CM | POA: Diagnosis present

## 2017-03-03 DIAGNOSIS — J440 Chronic obstructive pulmonary disease with acute lower respiratory infection: Secondary | ICD-10-CM | POA: Diagnosis present

## 2017-03-03 DIAGNOSIS — Z880 Allergy status to penicillin: Secondary | ICD-10-CM

## 2017-03-03 DIAGNOSIS — Z7982 Long term (current) use of aspirin: Secondary | ICD-10-CM

## 2017-03-03 DIAGNOSIS — Z87891 Personal history of nicotine dependence: Secondary | ICD-10-CM

## 2017-03-03 LAB — COMPREHENSIVE METABOLIC PANEL
ALK PHOS: 68 U/L (ref 38–126)
ALT: 25 U/L (ref 17–63)
ANION GAP: 7 (ref 5–15)
AST: 40 U/L (ref 15–41)
Albumin: 3.1 g/dL — ABNORMAL LOW (ref 3.5–5.0)
BILIRUBIN TOTAL: 1.1 mg/dL (ref 0.3–1.2)
BUN: 15 mg/dL (ref 6–20)
CALCIUM: 8.3 mg/dL — AB (ref 8.9–10.3)
CO2: 29 mmol/L (ref 22–32)
Chloride: 104 mmol/L (ref 101–111)
Creatinine, Ser: 1.07 mg/dL (ref 0.61–1.24)
GFR calc non Af Amer: >60 mL/min (ref >60)
Glucose, Bld: 75 mg/dL (ref 65–99)
Potassium: 3.9 mmol/L (ref 3.5–5.1)
SODIUM: 140 mmol/L (ref 135–145)
TOTAL PROTEIN: 6 g/dL — AB (ref 6.5–8.1)

## 2017-03-03 LAB — BLOOD GAS, VENOUS
ACID-BASE EXCESS: 5.1 mmol/L — AB (ref 0.0–2.0)
BICARBONATE: 31.3 mmol/L — AB (ref 20.0–28.0)
DRAWN BY: 257881
O2 SAT: 60.7 %
PCO2 VEN: 54.8 mmHg (ref 44.0–60.0)
PO2 VEN: 34.7 mmHg (ref 32.0–45.0)
Patient temperature: 98.6
pH, Ven: 7.375 (ref 7.250–7.430)

## 2017-03-03 LAB — URINALYSIS, ROUTINE W REFLEX MICROSCOPIC
Bilirubin Urine: NEGATIVE
GLUCOSE, UA: 50 mg/dL — AB
HGB URINE DIPSTICK: NEGATIVE
Ketones, ur: 20 mg/dL — AB
Leukocytes, UA: NEGATIVE
Nitrite: NEGATIVE
PH: 8 (ref 5.0–8.0)
PROTEIN: NEGATIVE mg/dL
Specific Gravity, Urine: 1.017 (ref 1.005–1.030)

## 2017-03-03 LAB — CBC WITH DIFFERENTIAL/PLATELET
BASOS ABS: 0 10*3/uL (ref 0.0–0.1)
BASOS PCT: 0 %
EOS PCT: 1 %
Eosinophils Absolute: 0.1 10*3/uL (ref 0.0–0.7)
HCT: 41.7 % (ref 39.0–52.0)
Hemoglobin: 13.1 g/dL (ref 13.0–17.0)
Lymphocytes Relative: 24 %
Lymphs Abs: 2 10*3/uL (ref 0.7–4.0)
MCH: 27.2 pg (ref 26.0–34.0)
MCHC: 31.4 g/dL (ref 30.0–36.0)
MCV: 86.5 fL (ref 78.0–100.0)
MONO ABS: 1 10*3/uL (ref 0.1–1.0)
Monocytes Relative: 12 %
Neutro Abs: 5.3 10*3/uL (ref 1.7–7.7)
Neutrophils Relative %: 63 %
PLATELETS: 239 10*3/uL (ref 150–400)
RBC: 4.82 MIL/uL (ref 4.22–5.81)
RDW: 20.6 % — AB (ref 11.5–15.5)
WBC: 8.3 10*3/uL (ref 4.0–10.5)

## 2017-03-03 LAB — I-STAT CHEM 8, ED
BUN: 14 mg/dL (ref 6–20)
CALCIUM ION: 1.1 mmol/L — AB (ref 1.15–1.40)
CHLORIDE: 102 mmol/L (ref 101–111)
Creatinine, Ser: 1.1 mg/dL (ref 0.61–1.24)
GLUCOSE: 71 mg/dL (ref 65–99)
HCT: 41 % (ref 39.0–52.0)
Hemoglobin: 13.9 g/dL (ref 13.0–17.0)
Potassium: 4 mmol/L (ref 3.5–5.1)
Sodium: 142 mmol/L (ref 135–145)
TCO2: 30 mmol/L (ref 22–32)

## 2017-03-03 LAB — CK: Total CK: 1612 U/L — ABNORMAL HIGH (ref 49–397)

## 2017-03-03 LAB — AMMONIA: AMMONIA: 34 umol/L (ref 9–35)

## 2017-03-03 LAB — TROPONIN I: Troponin I: <0.03 ng/mL (ref <0.03)

## 2017-03-03 LAB — ACETAMINOPHEN LEVEL

## 2017-03-03 LAB — ETHANOL

## 2017-03-03 LAB — GLUCOSE, CAPILLARY: Glucose-Capillary: 107 mg/dL — ABNORMAL HIGH (ref 65–99)

## 2017-03-03 LAB — CBG MONITORING, ED
Glucose-Capillary: 69 mg/dL (ref 65–99)
Glucose-Capillary: 73 mg/dL (ref 65–99)

## 2017-03-03 LAB — I-STAT TROPONIN, ED: Troponin i, poc: 0.01 ng/mL (ref 0.00–0.08)

## 2017-03-03 LAB — BRAIN NATRIURETIC PEPTIDE: B Natriuretic Peptide: 77.2 pg/mL (ref 0.0–100.0)

## 2017-03-03 LAB — SALICYLATE LEVEL

## 2017-03-03 LAB — I-STAT CG4 LACTIC ACID, ED: Lactic Acid, Venous: 0.89 mmol/L (ref 0.5–1.9)

## 2017-03-03 MED ORDER — SODIUM CHLORIDE 0.9 % IV BOLUS (SEPSIS)
500.0000 mL | Freq: Once | INTRAVENOUS | Status: AC
  Administered 2017-03-03: 500 mL via INTRAVENOUS

## 2017-03-03 MED ORDER — IPRATROPIUM-ALBUTEROL 0.5-2.5 (3) MG/3ML IN SOLN
3.0000 mL | Freq: Four times a day (QID) | RESPIRATORY_TRACT | Status: DC
  Administered 2017-03-03: 3 mL via RESPIRATORY_TRACT

## 2017-03-03 MED ORDER — TRAMADOL HCL 50 MG PO TABS
50.0000 mg | ORAL_TABLET | Freq: Four times a day (QID) | ORAL | Status: DC | PRN
  Administered 2017-03-04 – 2017-03-05 (×2): 50 mg via ORAL
  Filled 2017-03-03 (×3): qty 1

## 2017-03-03 MED ORDER — AMIODARONE HCL 200 MG PO TABS
200.0000 mg | ORAL_TABLET | Freq: Every day | ORAL | Status: DC
  Administered 2017-03-04 – 2017-03-07 (×4): 200 mg via ORAL
  Filled 2017-03-03 (×4): qty 1

## 2017-03-03 MED ORDER — POTASSIUM CHLORIDE CRYS ER 20 MEQ PO TBCR
20.0000 meq | EXTENDED_RELEASE_TABLET | Freq: Every day | ORAL | Status: DC
  Administered 2017-03-04 – 2017-03-07 (×4): 20 meq via ORAL
  Filled 2017-03-03 (×4): qty 1

## 2017-03-03 MED ORDER — ATORVASTATIN CALCIUM 40 MG PO TABS
40.0000 mg | ORAL_TABLET | Freq: Every day | ORAL | Status: DC
  Administered 2017-03-03 – 2017-03-07 (×5): 40 mg via ORAL
  Filled 2017-03-03 (×5): qty 1

## 2017-03-03 MED ORDER — VANCOMYCIN HCL IN DEXTROSE 1-5 GM/200ML-% IV SOLN
1000.0000 mg | Freq: Once | INTRAVENOUS | Status: AC
  Administered 2017-03-03: 1000 mg via INTRAVENOUS
  Filled 2017-03-03: qty 200

## 2017-03-03 MED ORDER — ASPIRIN EC 81 MG PO TBEC
81.0000 mg | DELAYED_RELEASE_TABLET | Freq: Every day | ORAL | Status: DC
  Administered 2017-03-03 – 2017-03-07 (×5): 81 mg via ORAL
  Filled 2017-03-03 (×5): qty 1

## 2017-03-03 MED ORDER — DEXTROSE 50 % IV SOLN
25.0000 mL | Freq: Once | INTRAVENOUS | Status: AC
  Administered 2017-03-03: 25 mL via INTRAVENOUS
  Filled 2017-03-03: qty 50

## 2017-03-03 MED ORDER — ONDANSETRON HCL 4 MG PO TABS: 4.0000 mg | ORAL_TABLET | Freq: Four times a day (QID) | ORAL | Status: DC | PRN

## 2017-03-03 MED ORDER — GUAIFENESIN ER 600 MG PO TB12
600.0000 mg | ORAL_TABLET | Freq: Two times a day (BID) | ORAL | Status: DC
  Administered 2017-03-03 – 2017-03-07 (×8): 600 mg via ORAL
  Filled 2017-03-03 (×8): qty 1

## 2017-03-03 MED ORDER — ARFORMOTEROL TARTRATE 15 MCG/2ML IN NEBU
15.0000 ug | INHALATION_SOLUTION | Freq: Two times a day (BID) | RESPIRATORY_TRACT | Status: DC
  Administered 2017-03-04 – 2017-03-05 (×2): 15 ug via RESPIRATORY_TRACT
  Filled 2017-03-03 (×3): qty 2

## 2017-03-03 MED ORDER — PANTOPRAZOLE SODIUM 40 MG PO TBEC
40.0000 mg | DELAYED_RELEASE_TABLET | Freq: Every day | ORAL | Status: DC
  Administered 2017-03-03 – 2017-03-07 (×5): 40 mg via ORAL
  Filled 2017-03-03 (×5): qty 1

## 2017-03-03 MED ORDER — LEVETIRACETAM 500 MG PO TABS
500.0000 mg | ORAL_TABLET | Freq: Two times a day (BID) | ORAL | Status: DC
  Administered 2017-03-03 – 2017-03-07 (×8): 500 mg via ORAL
  Filled 2017-03-03 (×8): qty 1

## 2017-03-03 MED ORDER — IPRATROPIUM-ALBUTEROL 0.5-2.5 (3) MG/3ML IN SOLN
3.0000 mL | RESPIRATORY_TRACT | Status: DC | PRN
  Filled 2017-03-03: qty 3

## 2017-03-03 MED ORDER — ENOXAPARIN SODIUM 40 MG/0.4ML ~~LOC~~ SOLN
40.0000 mg | Freq: Every day | SUBCUTANEOUS | Status: DC
  Administered 2017-03-03 – 2017-03-06 (×4): 40 mg via SUBCUTANEOUS
  Filled 2017-03-03 (×4): qty 0.4

## 2017-03-03 MED ORDER — BUDESONIDE 0.5 MG/2ML IN SUSP
0.5000 mg | Freq: Two times a day (BID) | RESPIRATORY_TRACT | Status: DC
  Administered 2017-03-03 – 2017-03-05 (×3): 0.5 mg via RESPIRATORY_TRACT
  Filled 2017-03-03 (×4): qty 2

## 2017-03-03 MED ORDER — DEXTROSE 5 % IV SOLN
2.0000 g | Freq: Once | INTRAVENOUS | Status: AC
  Administered 2017-03-03: 2 g via INTRAVENOUS
  Filled 2017-03-03: qty 2

## 2017-03-03 MED ORDER — ALPRAZOLAM 1 MG PO TABS
1.0000 mg | ORAL_TABLET | Freq: Three times a day (TID) | ORAL | Status: DC | PRN
  Filled 2017-03-03: qty 1

## 2017-03-03 MED ORDER — ONDANSETRON HCL 4 MG/2ML IJ SOLN: 4.0000 mg | Freq: Four times a day (QID) | INTRAMUSCULAR | Status: DC | PRN

## 2017-03-03 MED ORDER — IPRATROPIUM-ALBUTEROL 0.5-2.5 (3) MG/3ML IN SOLN
3.0000 mL | Freq: Once | RESPIRATORY_TRACT | Status: DC
  Filled 2017-03-03: qty 3

## 2017-03-03 NOTE — H&P (Addendum)
History and Physical    Patrick Holt LZJ:[id number] DOB: [birth year] DOA: 03/03/2017  Referring MD/NP/PA: Dr. Theotis Burrow PCP: [facility]  [facility]  Chief Complaint: Fall  I have personally briefly reviewed patient's old medical records in [place]   HPI: Patrick Holt is a 67 y.o. male with medical history significant of COPD, HTN, A. fib not on anticoagulation, bipolar disorder, seizure disorder, dermatomyositis, CVA; who presents with complaints of weakness after having a fall.  Patient was just recently hospitalized from 1/6 -1/9 after being found with acute respiratory failure with hypoxia and hypercapnia requiring BiPAP.  It appears patient was thought to be fluid overloaded treated with IV Lasix and discharged home on p.o. Lasix.  Some of the history is obtained from the patient who is a poor historian.  At baseline he ambulates with the use of a walker or scooter.  Since being home patient reports going to the drugstore to get his medications, but for the last 4 days been unable to reach the medicines.  He reports having a fall where he hit the bridge of his nose and scraped his right knee what seems like 4 days ago as well and that is the cause that he was unable to get to his medicines.  He reports associated symptoms of urinary incontinence, shortness of breath, productive cough.  Someone came to check on him today and that is when EMS was called.  Upon EMS arrival patient was noted to have O2 saturations in the 80s on room air and was placed on supplemental oxygen.   ED Course: Upon admission into the emergency department patient was noted to be afebrile, pulse 66-71, respirations 15-26, blood pressure 102/88-116/69, and O2 saturations as low as 89% placed on 2 L nasal cannula oxygen with improvement greater than 92%.  Labs revealed  BNP of 77.2, CPK 1612, and all other labs relatively within normal  limits.  CT scan of the brain was negative for any acute abnormalities.  Chest x-ray showed cardiomegaly with vascular congestion and atelectasis versus pneumonia.  Blood cultures were obtained given patient's recent hospitalization and was empirically given vancomycin and Zosyn.  Patient was thought to be dry was initially given 500 mL of normal saline IV fluids.   Review of Systems  Constitutional: Negative for chills and fever.  HENT: Negative for nosebleeds and tinnitus.   Eyes: Negative for double vision and photophobia.  Respiratory: Positive for cough, sputum production and shortness of breath.   Cardiovascular: Positive for leg swelling. Negative for chest pain.  Gastrointestinal: Negative for abdominal pain, nausea and vomiting.  Genitourinary: Negative for dysuria and hematuria.  Musculoskeletal: Positive for falls, joint pain and myalgias.  Skin:       Positive for abrasions  Neurological: Negative for focal weakness and seizures.  Endo/Heme/Allergies: Negative for polydipsia.  Psychiatric/Behavioral: Negative for substance abuse and suicidal ideas.    Past Medical History:  Diagnosis Date  . Anemia   . Anxiety   . Asthma   . Atrial fibrillation ([place]) 01/2012   with RVR  . Bipolar 1 disorder ([place])   . Cancer ([place])   . Collagen vascular disease ([place])   . Conversion disorder 06/2010  . COPD (chronic obstructive pulmonary disease) ([place])   . Dermatomyositis ([place])   . Dermatomyositis ([place])   . Diverticulitis   . Diverticulosis of colon   . Dysrhythmia    "irregular" (11/15/2012)  . Esophageal  dysmotility   . Esophageal stricture   . Fibromyalgia   . Gastritis   . GERD (gastroesophageal reflux disease)   . Hand fracture, right    sept 13, 2017, still wearing splint as og 01-10-2017  . Headache(784.0)    "severe; get them often" (11/15/2012)  . Hiatal hernia   . History of narcotic addiction ([place])   . Hx of adenomatous colonic polyps   . Hyperlipidemia   .  Hypertension   . Internal hemorrhoids   . Ischemic heart disease   . Major depression    with acute psychotic break in 06/2010  . Myocardial infarct ([place])    mulitple (1999, 2000, 2004)  . Narcotic dependence ([place])   . Nephrolithiasis   . Obesity   . OCD (obsessive compulsive disorder)   . Otosclerosis   . Paroxysmal A-fib ([place])   . Peripheral neuropathy   . Raynaud's disease   . Renal insufficiency   . Rheumatoid arthritis(714.0)   . Sarcoidosis   . Seizures ([place])   . Subarachnoid hemorrhage ([place]) 01/2012   with subdural  hematoma.   . Type II diabetes mellitus ([place])   . Urge incontinence   . Vertigo     Past Surgical History:  Procedure Laterality Date  . BACK SURGERY    . CARDIAC CATHETERIZATION    . CARPAL TUNNEL RELEASE Bilateral   . CATARACT EXTRACTION W/ INTRAOCULAR LENS IMPLANT Left   . COLONOSCOPY N/A 09/27/2012   Procedure: COLONOSCOPY;  Surgeon: Lafayette Dragon, MD;  Location: [facility] END[facility]COPY;  Service: Endoscopy;  Laterality: N/A;  . ESOPHAGOGASTRODUODENOSCOPY N/A 09/27/2012   Procedure: ESOPHAGOGASTRODUODENOSCOPY (EGD);  Surgeon: Lafayette Dragon, MD;  Location: [facility];  Service: Endoscopy;  Laterality: N/A;  . KNEE ARTHROSCOPY W/ MENISCAL REPAIR Left 2009  . LUMBAR DISC SURGERY    . LYMPH NODE DISSECTION Right 1970's   "neck; dr thought I had Hodgkins; turned out to be sarcoidosis" (11/15/2012)  . squamous papilloma   2010   removed by Dr. Constance Holster ENT, noted on tongue  . TONSILLECTOMY       reports that he quit smoking about 2 years ago. His smoking use included cigarettes. He has a 15.00 pack-year smoking history. he has never used smokeless tobacco. He reports that he does not drink alcohol or use drugs.  Allergies  Allergen Reactions  . Immune Globulins Other (See Comments)    Acute renal failure  . Ciprofloxacin Swelling  . Flagyl [Metronidazole] Swelling  . Lisinopril Diarrhea  . Sulfa Antibiotics Other (See Comments)    blisters  . Morphine And  Related     Does not help with pain  . Requip [Ropinirole Hcl]     constipation  . Betamethasone Dipropionate Other (See Comments)    Unknown  . Bupropion Hcl Other (See Comments)    Unknown  . Clobetasol Other (See Comments)    Unknown  . Codeine Other (See Comments)    Does not help with pain  . Escitalopram Oxalate Other (See Comments)    Unknown  . Fluoxetine Hcl Other (See Comments)    Unknown  . Furosemide Other (See Comments)    Unknown  . Meclizine Rash  . Paroxetine Other (See Comments)    Unknown  . Penicillins Other (See Comments)    Unknown reaction Has patient had a PCN reaction causing immediate rash, facial/tongue/throat swelling, SOB or lightheadedness with hypotension: unknown Has patient had a PCN reaction causing severe rash involving mucus membranes or skin necrosis: unknown  Has patient had a PCN reaction that required hospitalization unknown Has patient had a PCN reaction occurring within the last 10 years: unknown If all of the above answers are "NO", then may proceed with Cephalosporin use. Unknown Unknown reactio  . Tacrolimus Other (See Comments)    Unknown  . Tetanus Toxoid Other (See Comments)    Unknown  . Tuberculin Purified Protein Derivative Other (See Comments)    Unknown    Family History  Problem Relation Age of Onset  . Alcohol abuse Mother   . Depression Mother   . Heart disease Mother   . Diabetes Mother   . Stroke Mother   . Coronary artery disease Father   . Diabetes Other        1/2 brother  . Hepatitis Brother     Prior to Admission medications   Medication Sig Start Date End Date Taking? Authorizing Provider  ALPRAZolam Duanne Moron) 1 MG tablet Take 1 mg by mouth 3 (three) times daily. 01/10/17  Yes [provider]  amiodarone (PACERONE) 200 MG tablet Take 1 tablet (200 mg total) by mouth daily. 02/10/17  Yes Alma Friendly, MD  amLODipine (NORVASC) 5 MG tablet Take 1 tablet (5 mg total) by mouth daily. 01/19/17   Yes Velvet Bathe, MD  aspirin EC 81 MG tablet Take 1 tablet (81 mg total) by mouth daily. 10/17/15  Yes [name], MD  atorvastatin (LIPITOR) 40 MG tablet Take 1 tablet (40 mg total) by mouth daily. 01/19/17  Yes Velvet Bathe, MD  budesonide-formoterol [facility]) 160-4.5 MCG/ACT inhaler Inhale 2 puffs into the lungs 2 (two) times daily. 01/19/17  Yes Velvet Bathe, MD  divalproex (DEPAKOTE ER) 250 MG 24 hr tablet Take 3 tablets (750 mg total) by mouth daily. 01/19/17  Yes Velvet Bathe, MD  ergocalciferol (VITAMIN D2) 50000 units capsule Take 50,000 Units by mouth once a week.   Yes [provider]  esomeprazole (NEXIUM) 40 MG capsule Take 40 mg by mouth daily. 07/26/16  Yes [provider]  furosemide (LASIX) 20 MG tablet Take 1 tablet (20 mg total) by mouth daily. 02/23/17 02/23/18 Yes Domenic Polite, MD  ipratropium-albuterol (DUONEB) 0.5-2.5 (3) MG/3ML SOLN Inhale 3 mLs into the lungs every 6 (six) hours as needed. Patient taking differently: Inhale 3 mLs into the lungs every 6 (six) hours as needed (for breathing).  11/17/15  Yes Elgergawy, Silver Huguenin, MD  levETIRAcetam (KEPPRA) 500 MG tablet Take 500 mg by mouth 2 (two) times daily.   Yes [provider]  loperamide (IMODIUM) 2 MG capsule Take 2 mg by mouth as needed for diarrhea or loose stools.   Yes [provider]  Multiple Vitamin (MULTIVITAMIN WITH MINERALS) TABS tablet Take 1 tablet by mouth daily. 10/17/15  Yes [name], MD  oxybutynin (DITROPAN) 5 MG tablet Take 15 mg by mouth daily.  07/26/16  Yes [provider]  potassium chloride 20 MEQ TBCR Take 20 mEq by mouth daily. 02/23/17  Yes Domenic Polite, MD  traMADol (ULTRAM) 50 MG tablet Take 50 mg by mouth every 6 (six) hours as needed for moderate pain.   Yes [provider]  predniSONE (DELTASONE) 20 MG tablet Take 1-2 tablets (20-40 mg total) by mouth daily with breakfast. Take 40mg  for 2days then 20mg  for 2days then STOP Patient  not taking: Reported on 03/03/2017 02/24/17   Domenic Polite, MD  ZITHROMAX Z-PAK 250 MG tablet Take 1 tablet (250 mg total) by mouth daily. For 2days Patient  not taking: Reported on 03/03/2017 02/23/17   Domenic Polite, MD    Physical Exam:  Constitutional: Elderly male who appears to be in some mild discomfort Vitals:   03/03/17 1815 03/03/17 1830 03/03/17 1845 03/03/17 1952  BP:    115/74  Pulse: 67 67 69 71  Resp: 16 18 19 16   Temp:      TempSrc:      SpO2: 98% 97% 92% 96%  Weight:      Height:       Eyes: PERRL, lids and conjunctivae normal ENMT: Mucous membranes are moist. Posterior pharynx clear of any exudate or lesions.Normal dentition.  Neck: normal, supple, no masses, no thyromegaly Respiratory: Decreased overall aeration with positive crackles noted with intermittent rhonchi.  Currently on 2 L of nasal cannula oxygen . Cardiovascular: Regular rate and rhythm, no murmurs / rubs / gallops.  +1-2 pitting edema of the b/l lower extremity. 2+ pedal pulses. No carotid bruits.   Abdomen: no tenderness, no masses palpated. No hepatosplenomegaly. Bowel sounds positive.  Musculoskeletal: no clubbing / cyanosis. No joint deformity upper and lower extremities. Good ROM, no contractures. Normal muscle tone.  Skin: Abrasions to the right knee and bridge of the nose. Neurologic: CN 2-12 grossly intact. Sensation intact, DTR normal. Strength 5/5 in all 4.  Psychiatric: Poor judgment and insight. Alert and oriented x 3.  Agitated mood.     Labs on Admission: I have personally reviewed following labs and imaging studies  CBC: Recent Labs  Lab 03/03/17 1709 03/03/17 1727  WBC 8.3  --   NEUTROABS 5.3  --   HGB 13.1 13.9  HCT 41.7 41.0  MCV 86.5  --   PLT 239  --    Basic Metabolic Panel: Recent Labs  Lab 03/03/17 1709 03/03/17 1727  NA 140 142  K 3.9 4.0  CL 104 102  CO2 29  --   GLUCOSE 75 71  BUN 15 14  CREATININE 1.07 1.10  CALCIUM 8.3*  --    GFR: Estimated  Creatinine Clearance: 74.8 mL/min (by C-G formula based on SCr of 1.1 mg/dL). Liver Function Tests: Recent Labs  Lab 03/03/17 1709  AST 40  ALT 25  ALKPHOS 68  BILITOT 1.1  PROT 6.0*  ALBUMIN 3.1*   No results for input(s): LIPASE, AMYLASE in the last 168 hours. No results for input(s): AMMONIA in the last 168 hours. Coagulation Profile: No results for input(s): INR, PROTIME in the last 168 hours. Cardiac Enzymes: Recent Labs  Lab 03/03/17 1709  CKTOTAL 1,612*   BNP (last 3 results) No results for input(s): PROBNP in the last 8760 hours. HbA1C: No results for input(s): HGBA1C in the last 72 hours. CBG: Recent Labs  Lab 03/03/17 1722 03/03/17 1900  GLUCAP 69 73   Lipid Profile: No results for input(s): CHOL, HDL, LDLCALC, TRIG, CHOLHDL, LDLDIRECT in the last 72 hours. Thyroid Function Tests: No results for input(s): TSH, T4TOTAL, FREET4, T3FREE, THYROIDAB in the last 72 hours. Anemia Panel: No results for input(s): VITAMINB12, FOLATE, FERRITIN, TIBC, IRON, RETICCTPCT in the last 72 hours. Urine analysis:    Component Value Date/Time   COLORURINE YELLOW 03/03/2017 1839   APPEARANCEUR CLEAR 03/03/2017 1839   LABSPEC 1.017 03/03/2017 1839   PHURINE 8.0 03/03/2017 1839   GLUCOSEU 50 (A) 03/03/2017 1839   HGBUR NEGATIVE 03/03/2017 1839   HGBUR negative 12/11/2009 1356   BILIRUBINUR NEGATIVE 03/03/2017 [place] MODERATE 06/05/2015 1407   KETONESUR 20 (A) 03/03/2017 1839  PROTEINUR NEGATIVE 03/03/2017 1839   UROBILINOGEN 2.0 06/05/2015 1407   UROBILINOGEN 1.0 12/30/2014 1830   NITRITE NEGATIVE 03/03/2017 1839   LEUKOCYTESUR NEGATIVE 03/03/2017 1839   Sepsis Labs: No results found for this or any previous visit (from the past 240 hour(s)).   Radiological Exams on Admission: Dg Chest 2 View  Result Date: 03/03/2017 CLINICAL DATA:  Generalize weakness EXAM: CHEST  2 VIEW COMPARISON:  10-04-2017 FINDINGS: Cardiomegaly with vascular congestion. Streaky  bibasilar atelectasis or infiltrates. Aortic atherosclerosis. No pneumothorax. Degenerative changes of the spine. IMPRESSION: 1. Cardiomegaly with vascular congestion 2. Streaky bibasilar atelectasis versus mild infiltrates. Electronically Signed   By: Donavan Foil M.D.   On: 03/03/2017 16:47   Ct Head Wo Contrast  Result Date: 03/03/2017 CLINICAL DATA:  Altered level of consciousness EXAM: CT HEAD WITHOUT CONTRAST TECHNIQUE: Contiguous axial images were obtained from the base of the skull through the vertex without intravenous contrast. COMPARISON:  CT brain 10-04-2017, 02/05/2017 FINDINGS: Brain: No acute territorial infarction, hemorrhage or intracranial mass is visualized. Old lacunar infarct in the central pons. Moderate vessel ischemic changes of the white matter. Mild to moderate atrophy. Stable ventricle size Vascular: No hyperdense vessels.  Carotid artery calcification Skull: Normal. Negative for fracture or focal lesion. Sinuses/Orbits: Mild mucosal thickening in the ethmoid sinuses. No acute orbital abnormality. Other: None. IMPRESSION: 1. No CT evidence for acute intracranial abnormality. 2. Atrophy and small vessel ischemic changes of the white matter. Electronically Signed   By: Donavan Foil M.D.   On: 03/03/2017 19:45    EKG: Independently reviewed.  Sinus rhythm at 70 bpm with background artifact similar to previous EKG tracing.  Assessment/Plan Acute respiratory failure with hypoxia, history of COPD: Patient noted to have O2 saturations in the 80s on room air upon EMS arrival.  At baseline patient not on oxygen.  Venous blood gas showed no signs of hypercarbia.  O2 saturations maintained on 2 L of nasal cannula oxygen. - Admit to telemetry bed - Continuous pulse oximetry with nasal cannula oxygen to keep O2 saturation greater than 92% - Follow-up blood cultures - Brovana and budesonide nebs - DuoNeb's  Diastolic CHF: Last EF noted to be 65-70% with grade 1 diastolic dysfunction  on 63/02/6008.  Chest x-ray showing patient had been given 500 mL of normal saline IV fluids initially as cardiomegaly with vascular congestion.  BNP noted to be slightly higher than previous at 77.2.  He had recently been discharged home on 20 mg of Lasix daily.  Patient was thought to be somewhat dry and was given 500 mL of normal saline IV fluids.   - Strict I&Os and daily weights - Will hold off additional fluids  - Lasix IV 20 mg BID as tolerated  - Restart oral Lasix when able  Acute metabolic encephalopathy: Acute.  Patient was initially noted to be hypoxic and initial CBG noted glucose of 71.  Review of records shows patient had mildly elevated ammonia levels in the past as well.  Suspect likely multifactorial - Neurochecks - check UDS - Monitor for seizure activity  - Check ammonia level   Hypoglycemia: Patient's initial CBG was noted to be 71.  Patient was given 25 mL of 50% dextrose with blood sugar. - Hypoglycemic protocols - Check CBG every 4 hours x4 overnight  Elevated CK: Patient found to have mildly elevated CK of 1612 on admission.  Review of records shows that CPK levels were routinely elevated and may related to history of dermatomyositis.  Less  likely rhabdomyolysis as urinalysis does not show any signs of hemoglobin without RBCs to suggest myoglobin. - Recheck CPK in a.m.   History of dermatomyositis: Patient currently not on any treatment for this condition and is monitored by rheumatology. - Continue outpatient follow-up with rheumatology  History of seizure disorder  - seizure precautions - Continue Keppra  Paroxysmal atrial fibrillation: Patient appears to be in sinus rhythm at this time.  Not on anticoagulation due to recurrent falls and seizure history. - Continue amiodarone  Essential hypertension - Hold amlodipine due to soft blood pressures  History of bipolar disorder: Stable - hold Depakote  Hyperlipidemia - Continue atorvastatin   DVT  prophylaxis: lovenox   Code Status: full  Family Communication: none  Disposition Plan: TBD  Consults called: none  Admission status: observation  Norval Morton MD [facility] Pager [phone]   If 7PM-7AM, please contact night-coverage [email or web address] Password [facility]  03/03/2017, 8:23 PM

## 2017-03-03 NOTE — Progress Notes (Signed)
A consult was received from an ED physician for vancomycin per pharmacy dosing.  The patient's profile has been reviewed for ht/wt/allergies/indication/available labs.   A one time order has been placed for vanc 1g.  Further antibiotics/pharmacy consults should be ordered by admitting physician if indicated.                       Thank you, Kara Mead 03/03/2017  7:50 PM

## 2017-03-03 NOTE — ED Provider Notes (Addendum)
[facility] DEPT Provider Note   CSN: [id number] Arrival date & time: 03/03/17  1523     History   Chief Complaint Chief Complaint  Patient presents with  . Weakness  . Fall  . hypoxia  . Altered Mental Status    HPI Patrick Holt is a 67 y.o. male.  67yo M w/ PMH including A fib, frequent falls, COPD, dermatomyositis, T2DM, schizophrenia who p/w AMS, hypoxia, and fall. PT lives at home and was picked up by EMS today for generalized weakness. He stated to them he had fallen and was on the floor for 3 days. He was incontinent of urine, noted to be 80% on RA and was placed on supplemental O2. He stated he had not had medication or food in 4 days. He denied N/V/D or fever. For me, he endorses cough but denies SOB or chest pain. He endorses "right kidney pain" but cannot elaborate.  LEVEL 5 CAVEAT DUE TO AMS   The history is provided by the EMS personnel and the patient.  Weakness  Associated symptoms include altered mental status.  Fall   Altered Mental Status   Associated symptoms include weakness.    Past Medical History:  Diagnosis Date  . Anemia   . Anxiety   . Asthma   . Atrial fibrillation ([place]) 01/2012   with RVR  . Bipolar 1 disorder ([place])   . Cancer ([place])   . Collagen vascular disease ([place])   . Conversion disorder 06/2010  . COPD (chronic obstructive pulmonary disease) ([place])   . Dermatomyositis ([place])   . Dermatomyositis ([place])   . Diverticulitis   . Diverticulosis of colon   . Dysrhythmia    "irregular" (11/15/2012)  . Esophageal dysmotility   . Esophageal stricture   . Fibromyalgia   . Gastritis   . GERD (gastroesophageal reflux disease)   . Hand fracture, right    sept 13, 2017, still wearing splint as og 01-10-2017  . Headache(784.0)    "severe; get them often" (11/15/2012)  . Hiatal hernia   . History of narcotic addiction ([place])   . Hx of adenomatous colonic polyps   . Hyperlipidemia   . Hypertension   .  Internal hemorrhoids   . Ischemic heart disease   . Major depression    with acute psychotic break in 06/2010  . Myocardial infarct ([place])    mulitple (1999, 2000, 2004)  . Narcotic dependence ([place])   . Nephrolithiasis   . Obesity   . OCD (obsessive compulsive disorder)   . Otosclerosis   . Paroxysmal A-fib ([place])   . Peripheral neuropathy   . Raynaud's disease   . Renal insufficiency   . Rheumatoid arthritis(714.0)   . Sarcoidosis   . Seizures ([place])   . Subarachnoid hemorrhage ([place]) 01/2012   with subdural  hematoma.   . Type II diabetes mellitus ([place])   . Urge incontinence   . Vertigo     Patient Active Problem List   Diagnosis Date Noted  . Acute respiratory failure with hypoxia ([place]) February 04, 202019  . Acute encephalopathy February 04, 202019  . Rhabdomyolysis 02/05/2017  . Acute respiratory failure with hypoxia and hypercapnia ([place]) 02/05/2017  . COPD with acute exacerbation ([place]) 02/05/2017  . Bipolar 1 disorder ([place]) 02/05/2017  . Anemia 02/05/2017  . Hypertension 02/05/2017  . Hand fracture, right 02/05/2017  . Narcotic dependence ([place]) 02/05/2017  . Pre-diabetes 02/05/2017  . PAF (paroxysmal atrial fibrillation) ([employer]) 02/05/2017  . Frequent falls 02/05/2017  . History  of dermatomyositis 02/05/2017  . Seizure disorder ([place]) 02/05/2017  . Bilateral lower extremity edema 02/05/2017  . Pulmonary artery hypertension ([place]) 01/15/2017  . Altered mental status 01/13/2017  . Low oxygen saturation   . Pain in right wrist 02/27/2016  . Altered mental state 11/14/2015  . Severe dehydration 11/14/2015  . Hypernatremia 11/14/2015  . Difficulty in walking, not elsewhere classified   . Right wrist fracture   . Closed displaced fracture of ulnar styloid with routine healing   . Scapholunate ligament injury with no instability   . Falls frequently   . Hypoxia 11/02/2015  . Anxiety 06/06/2015  . Impaired mobility 04/28/2015  . Lumbar spondylosis with myelopathy 03/07/2015  . Nocturia  12/03/2014  . Chronic venous insufficiency 09/23/2014  . Poor social situation 07/30/2014  . Schizophrenia, acute ([place])   . Schizoaffective disorder, bipolar type ([place])   . Decreased visual acuity 06/27/2014  . COPD exacerbation ([place])   . Right sided weakness 01/04/2014  . Essential hypertension, benign   . Atrial fibrillation, unspecified   . Radiculopathy of cervical region   . Polyuria 12/07/2013  . Erectile dysfunction 12/07/2013  . Diverticulitis 08/01/2013  . Acute diverticulitis 08/01/2013  . Hematochezia 06/04/2013  . Hypokalemia 10/03/2012  . Benign neoplasm of colon 09/27/2012  . Stricture and stenosis of esophagus 09/27/2012  . Diverticulitis of colon without hemorrhage 07/26/2012  . Polypharmacy 03/26/2012  . Seizures ([place]) 02/10/2012  . Atrial fibrillation ([place]) 02/05/2012  . Coronary atherosclerosis of native coronary artery 02/05/2012  . Radicular low back pain 03/09/2011  . History of seizure disorder 01/29/2011  . Irritable bowel syndrome (IBS) 06/24/2010  . Urge incontinence 06/19/2010  . TOBACCO USER 11/23/2008  . DEPRESSION, MAJOR 05/31/2008  . ISCHEMIC HEART DISEASE 05/31/2008  . RAYNAUD'S DISEASE 05/31/2008  . HIATAL HERNIA 05/31/2008  . Rheumatoid arthritis ([place]) 05/31/2008  . FIBROMYALGIA 05/31/2008  . Hyperlipidemia 01/26/2008  . Anxiety state 01/26/2008  . HEMORRHOIDS, INTERNAL 01/26/2008  . COPD (chronic obstructive pulmonary disease) ([place]) 01/26/2008  . ESOPHAGEAL MOTILITY DISORDER 01/26/2008  . GERD 01/26/2008  . Dermatomyositis ([place]) 01/26/2008    Past Surgical History:  Procedure Laterality Date  . BACK SURGERY    . CARDIAC CATHETERIZATION    . CARPAL TUNNEL RELEASE Bilateral   . CATARACT EXTRACTION W/ INTRAOCULAR LENS IMPLANT Left   . COLONOSCOPY N/A 09/27/2012   Procedure: COLONOSCOPY;  Surgeon: Lafayette Dragon, MD;  Location: [facility] ENDOSCOPY;  Service: Endoscopy;  Laterality: N/A;  . ESOPHAGOGASTRODUODENOSCOPY N/A 09/27/2012   Procedure:  ESOPHAGOGASTRODUODENOSCOPY (EGD);  Surgeon: Lafayette Dragon, MD;  Location: [facility];  Service: Endoscopy;  Laterality: N/A;  . KNEE ARTHROSCOPY W/ MENISCAL REPAIR Left 2009  . LUMBAR DISC SURGERY    . LYMPH NODE DISSECTION Right 1970's   "neck; dr thought I had Hodgkins; turned out to be sarcoidosis" (11/15/2012)  . squamous papilloma   2010   removed by Dr. Constance Holster ENT, noted on tongue  . TONSILLECTOMY         Home Medications    Prior to Admission medications   Medication Sig Start Date End Date Taking? Authorizing Provider  ALPRAZolam Duanne Moron) 1 MG tablet Take 1 mg by mouth 3 (three) times daily. 01/10/17  Yes [provider]  amiodarone (PACERONE) 200 MG tablet Take 1 tablet (200 mg total) by mouth daily. 02/10/17  Yes Alma Friendly, MD  amLODipine (NORVASC) 5 MG tablet Take 1 tablet (5 mg total) by mouth daily. 01/19/17  Yes Velvet Bathe, MD  aspirin EC 81 MG tablet Take 1 tablet (81 mg total) by mouth daily. 10/17/15  Yes [name], MD  atorvastatin (LIPITOR) 40 MG tablet Take 1 tablet (40 mg total) by mouth daily. 01/19/17  Yes Velvet Bathe, MD  budesonide-formoterol [facility]) 160-4.5 MCG/ACT inhaler Inhale 2 puffs into the lungs 2 (two) times daily. 01/19/17  Yes Velvet Bathe, MD  divalproex (DEPAKOTE ER) 250 MG 24 hr tablet Take 3 tablets (750 mg total) by mouth daily. 01/19/17  Yes Velvet Bathe, MD  ergocalciferol (VITAMIN D2) 50000 units capsule Take 50,000 Units by mouth once a week.   Yes [provider]  esomeprazole (NEXIUM) 40 MG capsule Take 40 mg by mouth daily. 07/26/16  Yes [provider]  furosemide (LASIX) 20 MG tablet Take 1 tablet (20 mg total) by mouth daily. 02/23/17 02/23/18 Yes Domenic Polite, MD  ipratropium-albuterol (DUONEB) 0.5-2.5 (3) MG/3ML SOLN Inhale 3 mLs into the lungs every 6 (six) hours as needed. Patient taking differently: Inhale 3 mLs into the lungs every 6 (six) hours as needed (for breathing).  11/17/15  Yes  Elgergawy, Silver Huguenin, MD  levETIRAcetam (KEPPRA) 500 MG tablet Take 500 mg by mouth 2 (two) times daily.   Yes [provider]  loperamide (IMODIUM) 2 MG capsule Take 2 mg by mouth as needed for diarrhea or loose stools.   Yes [provider]  Multiple Vitamin (MULTIVITAMIN WITH MINERALS) TABS tablet Take 1 tablet by mouth daily. 10/17/15  Yes [name], MD  oxybutynin (DITROPAN) 5 MG tablet Take 15 mg by mouth daily.  07/26/16  Yes [provider]  potassium chloride 20 MEQ TBCR Take 20 mEq by mouth daily. 02/23/17  Yes Domenic Polite, MD  traMADol (ULTRAM) 50 MG tablet Take 50 mg by mouth every 6 (six) hours as needed for moderate pain.   Yes [provider]  predniSONE (DELTASONE) 20 MG tablet Take 1-2 tablets (20-40 mg total) by mouth daily with breakfast. Take 40mg  for 2days then 20mg  for 2days then STOP Patient not taking: Reported on 03/03/2017 02/24/17   Domenic Polite, MD  ZITHROMAX Z-PAK 250 MG tablet Take 1 tablet (250 mg total) by mouth daily. For 2days Patient not taking: Reported on 03/03/2017 02/23/17   Domenic Polite, MD    Family History Family History  Problem Relation Age of Onset  . Alcohol abuse Mother   . Depression Mother   . Heart disease Mother   . Diabetes Mother   . Stroke Mother   . Coronary artery disease Father   . Diabetes Other        1/2 brother  . Hepatitis Brother     Social History Social History   Tobacco Use  . Smoking status: Former Smoker    Packs/day: 0.50    Years: 30.00    Pack years: 15.00    Types: Cigarettes    Last attempt to quit: 12/15/2014    Years since quitting: 2.2  . Smokeless tobacco: Never Used  . Tobacco comment: 3 cigs a day  Substance Use Topics  . Alcohol use: No    Alcohol/week: 0.0 oz    Comment: Alcohol stopped in September of 2014  . Drug use: No     Allergies   Immune globulins; Ciprofloxacin; Flagyl [metronidazole]; Lisinopril; Sulfa antibiotics; Morphine and related;  Requip [ropinirole hcl]; Betamethasone dipropionate; Bupropion hcl; Clobetasol; Codeine; Escitalopram oxalate; Fluoxetine hcl; Furosemide; Meclizine; Paroxetine; Penicillins; Tacrolimus; Tetanus toxoid; and Tuberculin purified protein derivative   Review of  Systems Review of Systems  Unable to perform ROS: Mental status change  Neurological: Positive for weakness.     Physical Exam Updated Vital Signs BP 116/69 (BP Location: Right Arm)   Pulse 70   Temp 98.8 F (37.1 C) (Oral)   Resp 17   Ht 5\' 7"  (1.702 m)   Wt 106.6 kg (235 lb)   SpO2 (!) 89% Comment: 2LNC APPLIED  BMI 36.81 kg/m   Physical Exam  Constitutional: He appears well-developed and well-nourished. No distress.  somnolent  HENT:  Head: Normocephalic.  Very dry mucous membranes and cracked lips Abrasion nasal bridge  Eyes: Conjunctivae are normal. Pupils are equal, round, and reactive to light.  Neck: Neck supple.  Cardiovascular: Normal rate and regular rhythm.  Murmur heard. Pulmonary/Chest: Effort normal. No respiratory distress.  Severely diminished b/l with crackles in LLL  Abdominal: Soft. Bowel sounds are normal. He exhibits no distension. There is no tenderness.  Musculoskeletal: He exhibits edema (2+ pitting BLE).  Neurological:  Arouses to voice but quickly falls back asleep, can answer basic questions but confused  Skin: Skin is warm and dry.  Ecchymosis R lower abdomen, non-tender  Nursing note and vitals reviewed.    ED Treatments / Results  Labs (all labs ordered are listed, but only abnormal results are displayed) Labs Reviewed  CULTURE, BLOOD (ROUTINE X 2)  CULTURE, BLOOD (ROUTINE X 2)  URINE CULTURE  COMPREHENSIVE METABOLIC PANEL  ETHANOL  ACETAMINOPHEN LEVEL  SALICYLATE LEVEL  CBC WITH DIFFERENTIAL/PLATELET  URINALYSIS, ROUTINE W REFLEX MICROSCOPIC  CK  BLOOD GAS, VENOUS  BRAIN NATRIURETIC PEPTIDE  I-STAT TROPONIN, ED  I-STAT CG4 LACTIC ACID, ED  I-STAT CHEM 8, ED  CBG  MONITORING, ED    EKG  EKG Interpretation None       Radiology No results found.  Procedures Procedures (including critical care time)  Medications Ordered in ED Medications  ipratropium-albuterol (DUONEB) 0.5-2.5 (3) MG/3ML nebulizer solution 3 mL (not administered)     Initial Impression / Assessment and Plan / ED Course  I have reviewed the triage vital signs and the nursing notes.  Pertinent labs & imaging results that were available during my care of the patient were reviewed by me and considered in my medical decision making (see chart for details).     PT brought in altered, hypoxic from home after fall. On 3L O2 w/ remainder of VS stable.  Lab work overall reassuring without evidence of dehydration or infection.  BNP normal.  CK 1600.  Gave small fluid bolus as I did not want to fluid overload him because of his hypoxia.  Head CT negative for acute process.  Chest x-ray shows cardiomegaly with some vascular congestion and bibasilar infiltrates.  I suspect pneumonia based on his hypoxia and altered mentation. Gave vanc and cefepime as he has been hospitalized recently. Discussed admission with [facility] hospitalist, Dr. Tamala Julian, and patient admitted for further treatment.  Final Clinical Impressions(s) / ED Diagnoses   Final diagnoses:  Acute respiratory failure with hypoxia ([place])  Non-traumatic rhabdomyolysis  Altered mental status, unspecified altered mental status type    ED Discharge Orders    None       Addison Whidbee, Wenda Overland, MD 03/04/17 0123    Rex Kras, Wenda Overland, MD 03/04/17 [phone]

## 2017-03-03 NOTE — ED Notes (Signed)
PT STATES HE IS NOT ON OXYGEN AT HOME. 88-90%. 2LNC APPLIED

## 2017-03-03 NOTE — Progress Notes (Signed)
Pt arrived to floor from ED. Placed in bed and on monitor. VSS. Pt AOx4. Bed alarm set. Vancomycin infusing at this time. Skin assessment completed with Arrie Aran, RN.

## 2017-03-03 NOTE — ED Notes (Signed)
ED TO INPATIENT HANDOFF REPORT  Name/Age/Gender Patrick Holt 67 y.o. male  Code Status    Code Status Orders  (From admission, onward)        Start     Ordered   03/03/17 2050  Full code  Continuous     03/03/17 2057    Code Status History    Date Active Date Inactive Code Status Order ID Comments User Context   02/20/2017 21:01 02/23/2017 22:21 Full Code [id number]  [name], MD Inpatient   02/05/2017 12:11 02/10/2017 20:51 Full Code [id number]  Samella Parr, NP ED   01/14/2017 01:11 01/19/2017 20:33 Full Code [id number]  Jani Gravel, MD Inpatient   11/21/2016 02:14 11/22/2016 19:41 Full Code [id number]  Jani Gravel, MD Inpatient   11/14/2015 15:27 11/18/2015 23:32 Full Code [id number]  Murlean Iba, MD ED   11/03/2015 00:19 11/05/2015 22:57 Full Code [id number]  Nicolette Bang, DO Inpatient   12/22/2014 03:44 12/22/2014 18:27 Full Code [id number]  Virginia Crews, MD Inpatient   07/16/2014 20:00 07/23/2014 20:45 Full Code [id number]  Clovis Fredrickson, MD Inpatient   07/06/2014 18:02 07/16/2014 19:27 Full Code [id number]  Olam Idler, MD Inpatient   03/29/2014 06:03 03/29/2014 22:40 Full Code [id number]  Nobie Putnam, DO Inpatient   01/04/2014 02:07 01/06/2014 16:30 Full Code [id number]  Leeanne Rio, MD ED   08/01/2013 14:31 08/03/2013 15:14 Full Code [id number]  Coral Spikes, DO Inpatient   06/04/2013 06:19 06/04/2013 21:48 Full Code [id number]  Bernadene Bell, MD Inpatient   12/28/2012 18:07 12/29/2012 19:43 Full Code [id number]  Leeanne Rio, MD ED   11/09/2012 09:16 11/16/2012 18:23 Full Code [id number]  Montez Morita, MD Inpatient   10/16/2012 20:57 10/20/2012 21:39 Full Code [id number]  Ma Hillock, DO Inpatient   09/26/2012 13:15 09/27/2012 16:07 Full Code [id number]  Willia Craze, NP Inpatient   02/12/2012 23:49 02/21/2012 21:25 Full Code [id number]  Richarda Blade, MD ED      Home/SNF/Other Home  Chief Complaint Mulitple  Falls,Weakness   Level of Care/Admitting Diagnosis ED Disposition    ED Disposition Condition Comment   Admit  Hospital Area: [facility] [[id number]]  Level of Care: Telemetry [5]  Admit to tele based on following criteria: Complex arrhythmia (Bradycardia/Tachycardia)  Diagnosis: Acute respiratory failure with hypoxia [facility]) [614431]  Admitting Physician: Norval Morton [[id number]]  Attending Physician: Norval Morton [phone]  PT Class (Do Not Modify): Observation [104]  PT Acc Code (Do Not Modify): Observation [10022]       Medical History Past Medical History:  Diagnosis Date  . Anemia   . Anxiety   . Asthma   . Atrial fibrillation ([place]) 01/2012   with RVR  . Bipolar 1 disorder ([place])   . Cancer ([place])   . Collagen vascular disease ([place])   . Conversion disorder 06/2010  . COPD (chronic obstructive pulmonary disease) ([place])   . Dermatomyositis ([place])   . Dermatomyositis ([place])   . Diverticulitis   . Diverticulosis of colon   . Dysrhythmia    "irregular" (11/15/2012)  . Esophageal dysmotility   . Esophageal stricture   . Fibromyalgia   . Gastritis   . GERD (gastroesophageal reflux disease)   . Hand fracture, right    sept 13, 2017, still wearing splint as og 01-10-2017  . Headache(784.0)    "severe; get them often" (11/15/2012)  . Hiatal hernia   . History of narcotic addiction ([place])   .  Hx of adenomatous colonic polyps   . Hyperlipidemia   . Hypertension   . Internal hemorrhoids   . Ischemic heart disease   . Major depression    with acute psychotic break in 06/2010  . Myocardial infarct ([place])    mulitple (1999, 2000, 2004)  . Narcotic dependence ([place])   . Nephrolithiasis   . Obesity   . OCD (obsessive compulsive disorder)   . Otosclerosis   . Paroxysmal A-fib ([place])   . Peripheral neuropathy   . Raynaud's disease   . Renal insufficiency   . Rheumatoid arthritis(714.0)   . Sarcoidosis   . Seizures ([place])   . Subarachnoid hemorrhage ([place])  01/2012   with subdural  hematoma.   . Type II diabetes mellitus ([place])   . Urge incontinence   . Vertigo     Allergies Allergies  Allergen Reactions  . Immune Globulins Other (See Comments)    Acute renal failure  . Ciprofloxacin Swelling  . Flagyl [Metronidazole] Swelling  . Lisinopril Diarrhea  . Sulfa Antibiotics Other (See Comments)    blisters  . Morphine And Related     Does not help with pain  . Requip [Ropinirole Hcl]     constipation  . Betamethasone Dipropionate Other (See Comments)    Unknown  . Bupropion Hcl Other (See Comments)    Unknown  . Clobetasol Other (See Comments)    Unknown  . Codeine Other (See Comments)    Does not help with pain  . Escitalopram Oxalate Other (See Comments)    Unknown  . Fluoxetine Hcl Other (See Comments)    Unknown  . Furosemide Other (See Comments)    Unknown  . Meclizine Rash  . Paroxetine Other (See Comments)    Unknown  . Penicillins Other (See Comments)    Unknown reaction Has patient had a PCN reaction causing immediate rash, facial/tongue/throat swelling, SOB or lightheadedness with hypotension: unknown Has patient had a PCN reaction causing severe rash involving mucus membranes or skin necrosis: unknown Has patient had a PCN reaction that required hospitalization unknown Has patient had a PCN reaction occurring within the last 10 years: unknown If all of the above answers are "NO", then may proceed with Cephalosporin use. Unknown Unknown reactio  . Tacrolimus Other (See Comments)    Unknown  . Tetanus Toxoid Other (See Comments)    Unknown  . Tuberculin Purified Protein Derivative Other (See Comments)    Unknown    IV Location/Drains/Wounds Patient Lines/Drains/Airways Status   Active Line/Drains/Airways    Name:   Placement date:   Placement time:   Site:   Days:   Peripheral IV 03/03/17 Left Antecubital   03/03/17    1753    Antecubital   less than 1   Incision (Closed) 07/10/14 Arm Right   07/10/14     2005     967   Wound / Incision (Open or Dehisced) Eye Right abrasion   -    -    Eye             Labs/Imaging Results for orders placed or performed during the hospital encounter of 03/03/17 (from the past 48 hour(s))  Comprehensive metabolic panel     Status: Abnormal   Collection Time: 03/03/17  5:09 PM  Result Value Ref Range   Sodium 140 135 - 145 mmol/L   Potassium 3.9 3.5 - 5.1 mmol/L   Chloride 104 101 - 111 mmol/L   CO2 29 22 - 32 mmol/L  Glucose, Bld 75 65 - 99 mg/dL   BUN 15 6 - 20 mg/dL   Creatinine, Ser 1.07 0.61 - 1.24 mg/dL   Calcium 8.3 (L) 8.9 - 10.3 mg/dL   Total Protein 6.0 (L) 6.5 - 8.1 g/dL   Albumin 3.1 (L) 3.5 - 5.0 g/dL   AST 40 15 - 41 U/L   ALT 25 17 - 63 U/L   Alkaline Phosphatase 68 38 - 126 U/L   Total Bilirubin 1.1 0.3 - 1.2 mg/dL   GFR calc non Af Amer >60 >60 mL/min   GFR calc Af Amer >60 >60 mL/min    Comment: (NOTE) The eGFR has been calculated using the CKD EPI equation. This calculation has not been validated in all clinical situations. eGFR's persistently <60 mL/min signify possible Chronic Kidney Disease.    Anion gap 7 5 - 15  Ethanol     Status: None   Collection Time: 03/03/17  5:09 PM  Result Value Ref Range   Alcohol, Ethyl (B) <10 <10 mg/dL    Comment:        LOWEST DETECTABLE LIMIT FOR SERUM ALCOHOL IS 10 mg/dL FOR MEDICAL PURPOSES ONLY   Acetaminophen level     Status: Abnormal   Collection Time: 03/03/17  5:09 PM  Result Value Ref Range   Acetaminophen (Tylenol), Serum <10 (L) 10 - 30 ug/mL    Comment:        THERAPEUTIC CONCENTRATIONS VARY SIGNIFICANTLY. A RANGE OF 10-30 ug/mL MAY BE AN EFFECTIVE CONCENTRATION FOR MANY PATIENTS. HOWEVER, SOME ARE BEST TREATED AT CONCENTRATIONS OUTSIDE THIS RANGE. ACETAMINOPHEN CONCENTRATIONS >150 ug/mL AT 4 HOURS AFTER INGESTION AND >50 ug/mL AT 12 HOURS AFTER INGESTION ARE OFTEN ASSOCIATED WITH TOXIC REACTIONS.   Salicylate level     Status: None   Collection Time:  03/03/17  5:09 PM  Result Value Ref Range   Salicylate Lvl <7.3 2.8 - 30.0 mg/dL  CBC with Differential     Status: Abnormal   Collection Time: 03/03/17  5:09 PM  Result Value Ref Range   WBC 8.3 4.0 - 10.5 K/uL   RBC 4.82 4.22 - 5.81 MIL/uL   Hemoglobin 13.1 13.0 - 17.0 g/dL   HCT 41.7 39.0 - 52.0 %   MCV 86.5 78.0 - 100.0 fL   MCH 27.2 26.0 - 34.0 pg   MCHC 31.4 30.0 - 36.0 g/dL   RDW 20.6 (H) 11.5 - 15.5 %   Platelets 239 150 - 400 K/uL   Neutrophils Relative % 63 %   Neutro Abs 5.3 1.7 - 7.7 K/uL   Lymphocytes Relative 24 %   Lymphs Abs 2.0 0.7 - 4.0 K/uL   Monocytes Relative 12 %   Monocytes Absolute 1.0 0.1 - 1.0 K/uL   Eosinophils Relative 1 %   Eosinophils Absolute 0.1 0.0 - 0.7 K/uL   Basophils Relative 0 %   Basophils Absolute 0.0 0.0 - 0.1 K/uL  CK     Status: Abnormal   Collection Time: 03/03/17  5:09 PM  Result Value Ref Range   Total CK 1,612 (H) 49 - 397 U/L  Brain natriuretic peptide     Status: None   Collection Time: 03/03/17  5:09 PM  Result Value Ref Range   B Natriuretic Peptide 77.2 0.0 - 100.0 pg/mL  CBG monitoring, ED     Status: None   Collection Time: 03/03/17  5:22 PM  Result Value Ref Range   Glucose-Capillary 69 65 - 99 mg/dL  I-stat troponin, ED  Status: None   Collection Time: 03/03/17  5:26 PM  Result Value Ref Range   Troponin i, poc 0.01 0.00 - 0.08 ng/mL   Comment 3            Comment: Due to the release kinetics of cTnI, a negative result within the first hours of the onset of symptoms does not rule out myocardial infarction with certainty. If myocardial infarction is still suspected, repeat the test at appropriate intervals.   I-Stat CG4 Lactic Acid, ED     Status: None   Collection Time: 03/03/17  5:27 PM  Result Value Ref Range   Lactic Acid, Venous 0.89 0.5 - 1.9 mmol/L  I-stat Chem 8, ED     Status: Abnormal   Collection Time: 03/03/17  5:27 PM  Result Value Ref Range   Sodium 142 135 - 145 mmol/L   Potassium 4.0 3.5  - 5.1 mmol/L   Chloride 102 101 - 111 mmol/L   BUN 14 6 - 20 mg/dL   Creatinine, Ser 1.10 0.61 - 1.24 mg/dL   Glucose, Bld 71 65 - 99 mg/dL   Calcium, Ion 1.10 (L) 1.15 - 1.40 mmol/L   TCO2 30 22 - 32 mmol/L   Hemoglobin 13.9 13.0 - 17.0 g/dL   HCT 41.0 39.0 - 52.0 %  Blood gas, venous     Status: Abnormal   Collection Time: 03/03/17  5:34 PM  Result Value Ref Range   pH, Ven 7.375 7.250 - 7.430   pCO2, Ven 54.8 44.0 - 60.0 mmHg   pO2, Ven 34.7 32.0 - 45.0 mmHg   Bicarbonate 31.3 (H) 20.0 - 28.0 mmol/L   Acid-Base Excess 5.1 (H) 0.0 - 2.0 mmol/L   O2 Saturation 60.7 %   Patient temperature 98.6    Collection site DRAWN BY RN    Drawn by [phone]    Sample type VENOUS   Urinalysis, Routine w reflex microscopic     Status: Abnormal   Collection Time: 03/03/17  6:39 PM  Result Value Ref Range   Color, Urine YELLOW YELLOW   APPearance CLEAR CLEAR   Specific Gravity, Urine 1.017 1.005 - 1.030   pH 8.0 5.0 - 8.0   Glucose, UA 50 (A) NEGATIVE mg/dL   Hgb urine dipstick NEGATIVE NEGATIVE   Bilirubin Urine NEGATIVE NEGATIVE   Ketones, ur 20 (A) NEGATIVE mg/dL   Protein, ur NEGATIVE NEGATIVE mg/dL   Nitrite NEGATIVE NEGATIVE   Leukocytes, UA NEGATIVE NEGATIVE  CBG monitoring, ED     Status: None   Collection Time: 03/03/17  7:00 PM  Result Value Ref Range   Glucose-Capillary 73 65 - 99 mg/dL   Dg Chest 2 View  Result Date: 03/03/2017 CLINICAL DATA:  Generalize weakness EXAM: CHEST  2 VIEW COMPARISON:  2020/06/917 FINDINGS: Cardiomegaly with vascular congestion. Streaky bibasilar atelectasis or infiltrates. Aortic atherosclerosis. No pneumothorax. Degenerative changes of the spine. IMPRESSION: 1. Cardiomegaly with vascular congestion 2. Streaky bibasilar atelectasis versus mild infiltrates. Electronically Signed   By: Donavan Foil M.D.   On: 03/03/2017 16:47   Ct Head Wo Contrast  Result Date: 03/03/2017 CLINICAL DATA:  Altered level of consciousness EXAM: CT HEAD WITHOUT CONTRAST  TECHNIQUE: Contiguous axial images were obtained from the base of the skull through the vertex without intravenous contrast. COMPARISON:  CT brain 2020/06/917, 02/05/2017 FINDINGS: Brain: No acute territorial infarction, hemorrhage or intracranial mass is visualized. Old lacunar infarct in the central pons. Moderate vessel ischemic changes of the white matter. Mild to  moderate atrophy. Stable ventricle size Vascular: No hyperdense vessels.  Carotid artery calcification Skull: Normal. Negative for fracture or focal lesion. Sinuses/Orbits: Mild mucosal thickening in the ethmoid sinuses. No acute orbital abnormality. Other: None. IMPRESSION: 1. No CT evidence for acute intracranial abnormality. 2. Atrophy and small vessel ischemic changes of the white matter. Electronically Signed   By: Donavan Foil M.D.   On: 03/03/2017 19:45    Pending Labs Unresulted Labs (From admission, onward)   Start     Ordered   03/04/17 0500  CBC  Tomorrow morning,   R     03/03/17 2057   03/04/17 1660  Basic metabolic panel  Tomorrow morning,   R     03/03/17 2057   03/04/17 0500  CK  Tomorrow morning,   R     03/03/17 2057   03/03/17 2103  Troponin I  Add-on,   R     03/03/17 2103   03/03/17 2101  Urine rapid drug screen (hosp performed)  STAT,   R     03/03/17 2100   03/03/17 2052  Ammonia  Add-on,   R     03/03/17 2057   03/03/17 1608  Culture, blood (routine x 2)  BLOOD CULTURE X 2,   STAT    Question:  Patient immune status  Answer:  Normal   03/03/17 1607   03/03/17 1608  Urine culture  STAT,   STAT    Question:  Patient immune status  Answer:  Normal   03/03/17 1607      Vitals/Pain Today's Vitals   03/03/17 1845 03/03/17 1952 03/03/17 2041 03/03/17 2148  BP:  115/74 102/88 91/65  Pulse: 69 71 70 76  Resp: '19 16 15 16  ' Temp:      TempSrc:      SpO2: 92% 96% 93% 92%  Weight:      Height:        Isolation Precautions No active isolations  Medications Medications  ipratropium-albuterol  (DUONEB) 0.5-2.5 (3) MG/3ML nebulizer solution 3 mL (not administered)  vancomycin (VANCOCIN) IVPB 1000 mg/200 mL premix (not administered)  ipratropium-albuterol (DUONEB) 0.5-2.5 (3) MG/3ML nebulizer solution 3 mL (not administered)  enoxaparin (LOVENOX) injection 40 mg (not administered)  ondansetron (ZOFRAN) tablet 4 mg (not administered)    Or  ondansetron (ZOFRAN) injection 4 mg (not administered)  ipratropium-albuterol (DUONEB) 0.5-2.5 (3) MG/3ML nebulizer solution 3 mL (not administered)  guaiFENesin (MUCINEX) 12 hr tablet 600 mg (not administered)  arformoterol (BROVANA) nebulizer solution 15 mcg (not administered)  budesonide (PULMICORT) nebulizer solution 0.5 mg (not administered)  aspirin EC tablet 81 mg (not administered)  ALPRAZolam (XANAX) tablet 1 mg (not administered)  atorvastatin (LIPITOR) tablet 40 mg (not administered)  traMADol (ULTRAM) tablet 50 mg (not administered)  levETIRAcetam (KEPPRA) tablet 500 mg (not administered)  amiodarone (PACERONE) tablet 200 mg (not administered)  Potassium Chloride ER TBCR 20 mEq (not administered)  pantoprazole (PROTONIX) EC tablet 40 mg (not administered)  dextrose 50 % solution 25 mL (25 mLs Intravenous Given 03/03/17 1753)  ceFEPIme (MAXIPIME) 2 g in dextrose 5 % 50 mL IVPB (2 g Intravenous New Bag/Given 03/03/17 2105)  sodium chloride 0.9 % bolus 500 mL (500 mLs Intravenous New Bag/Given 03/03/17 2106)    Mobility power wheelchair

## 2017-03-03 NOTE — ED Triage Notes (Signed)
Per [facility]- Pt resides at home. Pt with HX of multiple falls. C/o generalized weakness. Pt reports on floor x 3 days. 02 sats 80% RA. 94% 3lNC. Incontinent of urine. Pt unable to ambulate without assistance. No medications or food since Sunday. In route assessed slight confusion. Pt denies N/V/D or fever.

## 2017-03-03 NOTE — ED Notes (Signed)
Delay in breathing treatment. Pt transported to CT scan.

## 2017-03-03 NOTE — Progress Notes (Addendum)
Consult request has been received. CSW attempting to follow up at present time.  CSW met with pt in June during pt's last ED visit where CSW was on-duty.  Pt has been admited since then more than once.  CSW met with the pt in June at the pt's request.  Pt at that time desired placement to an ALF.  CSW had educated the pt in June on how to seek placement through pt's PCP, etc.  CSW will meet with pt's EDP.  CSW will continue to follow for D/C needs.  9:57 PM Per EDP, pt is being admitted.  CSW attempted to complete assessment with the pt, but pt was receiving a breathing treatment and/or meeting with a provider.  Please reconsult if future social work needs arise.  CSW signing off, as social work intervention is no longer needed.  Alphonse Guild. Quay Simkin, LCSW, LCAS, CSI Clinical Social Worker Ph: [phone]

## 2017-03-04 ENCOUNTER — Observation Stay (HOSPITAL_COMMUNITY): Payer: Medicare Other

## 2017-03-04 DIAGNOSIS — R4182 Altered mental status, unspecified: Secondary | ICD-10-CM

## 2017-03-04 DIAGNOSIS — G40909 Epilepsy, unspecified, not intractable, without status epilepticus: Secondary | ICD-10-CM | POA: Diagnosis present

## 2017-03-04 DIAGNOSIS — Z008 Encounter for other general examination: Secondary | ICD-10-CM | POA: Diagnosis not present

## 2017-03-04 DIAGNOSIS — J9601 Acute respiratory failure with hypoxia: Secondary | ICD-10-CM | POA: Diagnosis present

## 2017-03-04 DIAGNOSIS — R0902 Hypoxemia: Secondary | ICD-10-CM

## 2017-03-04 DIAGNOSIS — Z818 Family history of other mental and behavioral disorders: Secondary | ICD-10-CM | POA: Diagnosis not present

## 2017-03-04 DIAGNOSIS — M3313 Other dermatomyositis without myopathy: Secondary | ICD-10-CM | POA: Diagnosis present

## 2017-03-04 DIAGNOSIS — G9341 Metabolic encephalopathy: Secondary | ICD-10-CM | POA: Diagnosis not present

## 2017-03-04 DIAGNOSIS — E162 Hypoglycemia, unspecified: Secondary | ICD-10-CM | POA: Diagnosis present

## 2017-03-04 DIAGNOSIS — J440 Chronic obstructive pulmonary disease with acute lower respiratory infection: Secondary | ICD-10-CM | POA: Diagnosis present

## 2017-03-04 DIAGNOSIS — F250 Schizoaffective disorder, bipolar type: Secondary | ICD-10-CM | POA: Diagnosis present

## 2017-03-04 DIAGNOSIS — I110 Hypertensive heart disease with heart failure: Secondary | ICD-10-CM | POA: Diagnosis present

## 2017-03-04 DIAGNOSIS — I5032 Chronic diastolic (congestive) heart failure: Secondary | ICD-10-CM | POA: Diagnosis present

## 2017-03-04 DIAGNOSIS — Z87891 Personal history of nicotine dependence: Secondary | ICD-10-CM | POA: Diagnosis not present

## 2017-03-04 DIAGNOSIS — S0031XA Abrasion of nose, initial encounter: Secondary | ICD-10-CM | POA: Diagnosis present

## 2017-03-04 DIAGNOSIS — I480 Paroxysmal atrial fibrillation: Secondary | ICD-10-CM | POA: Diagnosis present

## 2017-03-04 DIAGNOSIS — K219 Gastro-esophageal reflux disease without esophagitis: Secondary | ICD-10-CM | POA: Diagnosis present

## 2017-03-04 DIAGNOSIS — W19XXXA Unspecified fall, initial encounter: Secondary | ICD-10-CM | POA: Diagnosis present

## 2017-03-04 DIAGNOSIS — M069 Rheumatoid arthritis, unspecified: Secondary | ICD-10-CM | POA: Diagnosis present

## 2017-03-04 DIAGNOSIS — E785 Hyperlipidemia, unspecified: Secondary | ICD-10-CM | POA: Diagnosis present

## 2017-03-04 DIAGNOSIS — F319 Bipolar disorder, unspecified: Secondary | ICD-10-CM | POA: Diagnosis present

## 2017-03-04 DIAGNOSIS — Z86010 Personal history of colonic polyps: Secondary | ICD-10-CM | POA: Diagnosis not present

## 2017-03-04 DIAGNOSIS — M6282 Rhabdomyolysis: Secondary | ICD-10-CM | POA: Diagnosis present

## 2017-03-04 DIAGNOSIS — R296 Repeated falls: Secondary | ICD-10-CM | POA: Diagnosis present

## 2017-03-04 DIAGNOSIS — Z736 Limitation of activities due to disability: Secondary | ICD-10-CM | POA: Diagnosis not present

## 2017-03-04 DIAGNOSIS — I252 Old myocardial infarction: Secondary | ICD-10-CM | POA: Diagnosis not present

## 2017-03-04 DIAGNOSIS — J189 Pneumonia, unspecified organism: Secondary | ICD-10-CM | POA: Diagnosis present

## 2017-03-04 DIAGNOSIS — R32 Unspecified urinary incontinence: Secondary | ICD-10-CM | POA: Diagnosis present

## 2017-03-04 DIAGNOSIS — M7989 Other specified soft tissue disorders: Secondary | ICD-10-CM | POA: Diagnosis not present

## 2017-03-04 DIAGNOSIS — Z811 Family history of alcohol abuse and dependence: Secondary | ICD-10-CM | POA: Diagnosis not present

## 2017-03-04 DIAGNOSIS — S80211A Abrasion, right knee, initial encounter: Secondary | ICD-10-CM | POA: Diagnosis present

## 2017-03-04 DIAGNOSIS — Z961 Presence of intraocular lens: Secondary | ICD-10-CM | POA: Diagnosis present

## 2017-03-04 DIAGNOSIS — Y95 Nosocomial condition: Secondary | ICD-10-CM | POA: Diagnosis present

## 2017-03-04 DIAGNOSIS — Z9842 Cataract extraction status, left eye: Secondary | ICD-10-CM | POA: Diagnosis not present

## 2017-03-04 LAB — GLUCOSE, CAPILLARY
GLUCOSE-CAPILLARY: 139 mg/dL — AB (ref 65–99)
Glucose-Capillary: 98 mg/dL (ref 65–99)
Glucose-Capillary: 92 mg/dL (ref 65–99)
Glucose-Capillary: 143 mg/dL — ABNORMAL HIGH (ref 65–99)

## 2017-03-04 LAB — BLOOD GAS, VENOUS
Acid-Base Excess: 4.8 mmol/L — ABNORMAL HIGH (ref 0.0–2.0)
Bicarbonate: 30.3 mmol/L — ABNORMAL HIGH (ref 20.0–28.0)
O2 SAT: 84.5 %
PATIENT TEMPERATURE: 98.6
PCO2 VEN: 50.8 mmHg (ref 44.0–60.0)
PO2 VEN: 52.5 mmHg — AB (ref 32.0–45.0)
pH, Ven: 7.393 (ref 7.250–7.430)

## 2017-03-04 LAB — BASIC METABOLIC PANEL
Anion gap: 6 (ref 5–15)
BUN: 13 mg/dL (ref 6–20)
CALCIUM: 7.8 mg/dL — AB (ref 8.9–10.3)
CHLORIDE: 104 mmol/L (ref 101–111)
CO2: 28 mmol/L (ref 22–32)
CREATININE: 1.02 mg/dL (ref 0.61–1.24)
GFR calc Af Amer: >60 mL/min (ref >60)
GFR calc non Af Amer: >60 mL/min (ref >60)
GLUCOSE: 92 mg/dL (ref 65–99)
Potassium: 4.2 mmol/L (ref 3.5–5.1)
Sodium: 138 mmol/L (ref 135–145)

## 2017-03-04 LAB — CBC
HEMATOCRIT: 40.3 % (ref 39.0–52.0)
HEMOGLOBIN: 12.4 g/dL — AB (ref 13.0–17.0)
MCH: 26.8 pg (ref 26.0–34.0)
MCHC: 30.8 g/dL (ref 30.0–36.0)
MCV: 87 fL (ref 78.0–100.0)
Platelets: 218 10*3/uL (ref 150–400)
RBC: 4.63 MIL/uL (ref 4.22–5.81)
RDW: 20.9 % — AB (ref 11.5–15.5)
WBC: 6.2 10*3/uL (ref 4.0–10.5)

## 2017-03-04 LAB — STREP PNEUMONIAE URINARY ANTIGEN: Strep Pneumo Urinary Antigen: NEGATIVE

## 2017-03-04 LAB — RAPID URINE DRUG SCREEN, HOSP PERFORMED
AMPHETAMINES: NOT DETECTED
BARBITURATES: NOT DETECTED
Benzodiazepines: POSITIVE — AB
COCAINE: NOT DETECTED
OPIATES: NOT DETECTED
Tetrahydrocannabinol: NOT DETECTED

## 2017-03-04 LAB — CK: Total CK: 998 U/L — ABNORMAL HIGH (ref 49–397)

## 2017-03-04 LAB — VALPROIC ACID LEVEL: VALPROIC ACID LVL: 21 ug/mL — AB (ref 50.0–100.0)

## 2017-03-04 MED ORDER — SODIUM CHLORIDE 0.9 % IV SOLN
1500.0000 mg | Freq: Once | INTRAVENOUS | Status: AC
  Administered 2017-03-04: 1500 mg via INTRAVENOUS
  Filled 2017-03-04: qty 1500

## 2017-03-04 MED ORDER — SODIUM CHLORIDE 0.9 % IV SOLN
1.0000 g | Freq: Once | INTRAVENOUS | Status: AC
  Administered 2017-03-04: 1 g via INTRAVENOUS
  Filled 2017-03-04: qty 10

## 2017-03-04 MED ORDER — VANCOMYCIN HCL IN DEXTROSE 750-5 MG/150ML-% IV SOLN
750.0000 mg | Freq: Two times a day (BID) | INTRAVENOUS | Status: DC
  Administered 2017-03-05: 750 mg via INTRAVENOUS
  Filled 2017-03-04 (×2): qty 150

## 2017-03-04 MED ORDER — FUROSEMIDE 10 MG/ML IJ SOLN
20.0000 mg | Freq: Two times a day (BID) | INTRAMUSCULAR | Status: AC
  Administered 2017-03-04 (×2): 20 mg via INTRAVENOUS
  Filled 2017-03-04 (×2): qty 2

## 2017-03-04 MED ORDER — CEFEPIME HCL 1 G IJ SOLR
1.0000 g | Freq: Three times a day (TID) | INTRAMUSCULAR | Status: DC
  Administered 2017-03-04 – 2017-03-05 (×3): 1 g via INTRAVENOUS
  Filled 2017-03-04 (×5): qty 1

## 2017-03-04 MED ORDER — IPRATROPIUM-ALBUTEROL 0.5-2.5 (3) MG/3ML IN SOLN
3.0000 mL | Freq: Three times a day (TID) | RESPIRATORY_TRACT | Status: DC
  Administered 2017-03-04 – 2017-03-05 (×2): 3 mL via RESPIRATORY_TRACT
  Filled 2017-03-04 (×3): qty 3

## 2017-03-04 MED ORDER — DIVALPROEX SODIUM ER 250 MG PO TB24
750.0000 mg | ORAL_TABLET | Freq: Every day | ORAL | Status: DC
  Administered 2017-03-04 – 2017-03-07 (×4): 750 mg via ORAL
  Filled 2017-03-04 (×4): qty 1

## 2017-03-04 NOTE — Progress Notes (Signed)
CSW acknowledged consult for SNF placement. CSW attempted to assess patient, patient asleep and requested that CSW comeback at a later time. CSW will try to assess patient at a later time.  Abundio Miu, [place] Social Worker [facility] Cell#: [phone]

## 2017-03-04 NOTE — Progress Notes (Addendum)
Pharmacy Antibiotic Note  Patrick Holt is a 67 y.o. male admitted on 03/03/2017 with pneumonia.  Pharmacy has been consulted for vancomycin dosing.  Patient had received one-time doses of vancomycin 1g and cefepime 2g last night. Restarting vancomycin and cefepime now.  Plan: Has been 18 hours since 1g vancomycin dose. Give vancomycin 1500 mg dose x 1 then start 750 mg IV q12h for AUC goal 400-500. Cefepime 1g IV q8h. Daily SCr.  Height: 5\' 3"  (160 cm) Weight: 225 lb 5 oz (102.2 kg) IBW/kg (Calculated) : 56.9  Temp (24hrs), Avg:99.2 F (37.3 C), Min:98.7 F (37.1 C), Max:100.2 F (37.9 C)  Recent Labs  Lab 03/03/17 1709 03/03/17 1727 03/04/17 0522  WBC 8.3  --  6.2  CREATININE 1.07 1.10 1.02  LATICACIDVEN  --  0.89  --     Estimated Creatinine Clearance: 73.5 mL/min (by C-G formula based on SCr of 1.02 mg/dL).    Allergies  Allergen Reactions  . Immune Globulins Other (See Comments)    Acute renal failure  . Ciprofloxacin Swelling  . Flagyl [Metronidazole] Swelling  . Lisinopril Diarrhea  . Sulfa Antibiotics Other (See Comments)    blisters  . Morphine And Related     Does not help with pain  . Requip [Ropinirole Hcl]     constipation  . Betamethasone Dipropionate Other (See Comments)    Unknown  . Bupropion Hcl Other (See Comments)    Unknown  . Clobetasol Other (See Comments)    Unknown  . Codeine Other (See Comments)    Does not help with pain  . Escitalopram Oxalate Other (See Comments)    Unknown  . Fluoxetine Hcl Other (See Comments)    Unknown  . Furosemide Other (See Comments)    Unknown  . Meclizine Rash  . Paroxetine Other (See Comments)    Unknown  . Penicillins Other (See Comments)    Unknown reaction Has patient had a PCN reaction causing immediate rash, facial/tongue/throat swelling, SOB or lightheadedness with hypotension: unknown Has patient had a PCN reaction causing severe rash involving mucus membranes or skin necrosis: unknown Has  patient had a PCN reaction that required hospitalization unknown Has patient had a PCN reaction occurring within the last 10 years: unknown If all of the above answers are "NO", then may proceed with Cephalosporin use. Unknown Unknown reactio  . Tacrolimus Other (See Comments)    Unknown  . Tetanus Toxoid Other (See Comments)    Unknown  . Tuberculin Purified Protein Derivative Other (See Comments)    Unknown    Antimicrobials this admission: 1/17 Vancomycin >> 1/17 Cefepime >>  Dose adjustments this admission:  Microbiology results: 1/17 BCx: ngtd 1/17 UCx:  1/18 legionella ur ag: ordered 1/18 strep pneumo ur ag: ordered  Thank you for allowing pharmacy to be a part of this patient's care.  Hershal Coria 03/04/2017 2:58 PM

## 2017-03-04 NOTE — Clinical Social Work Note (Signed)
Patient recently assessed on 02/09/2017 no psychosocial changes reported, please see psychosocial assessment below. CSW spoke with patient regarding PT recommendation for SNF for ST rehab. Patient declined SNF reported he wants to continue home health PT. CSW emphasized that SNF was for a short period of time to do PT, patient again declined noting he can do PT at home. Patient reported that the only thing he will need is a ride home noting he uses [employer] for transportation. CSW informed RNCM of patient's interest in home health services. CSW signing off, no other needs identified at this time.  Clinical Social Work Assessment  Patient Details  Name: Patrick Holt MRN: [id number] Date of Birth: 1950-06-20  Date of referral:                  Reason for consult:                   Permission sought to share information with:    Permission granted to share information::     Name::        Agency::     Relationship::     Contact Information:     Housing/Transportation Living arrangements for the past 2 months:   [facility] ([facility]) Source of Information:   Patient  Patient Interpreter Needed:   None Criminal Activity/Legal Involvement Pertinent to Current Situation/Hospitalization:    Significant Relationships:   No Lives with:   self Do you feel safe going back to the place where you live?   Yes Need for family participation in patient care:   Yes  Care giving concerns:  No family at bedside. Patient stated he lives in a retirement Artist / plan: CSW met patient at bedside to offer support and discuss discharge needs. Patient stated he lives on the 3rd floor of his apartment complex and he has a motorize scooter to help get him around. Patient stated he does not want to go to SNF because he needs to be at his home by January 1st. Patient stated he needs to be home to pay rent and he does not trust his friend to do it for  him. Patient stated he would like home health to come into his home to work on PT/OT with him. CSW to relay message to the [facility] on the floor. CSW signing off as patient is refusing snf   Employment status:   Retired Programmer, applications PT Recommendations:   [place] / Referral to community resources:    [place]  Patient/Family's Response to care:  Patient eager to go home  Patient/Family's Understanding of and Emotional Response to Diagnosis, Current Treatment, and Prognosis: Patient aware that he will need short term rehab but would like to do PT at home Emotional Assessment Appearance:   Appears Stated Age Attitude/Demeanor/Rapport:   Anxious, Apprehensive Affect (typically observed):    Orientation:   Oriented to place, Oriented to time, Oriented to situation, oriented to self Alcohol / Substance use:   Not applicable Psych involvement (Current and /or in the community):   No   Discharge Needs  Concerns to be addressed:   Care Coordination Readmission within the last 30 days:   Yes Current discharge risk:   Lives alone Barriers to Discharge:   Continued medical workup   Burnis Medin, LCSW 03/04/2017, 12:54 PM

## 2017-03-04 NOTE — Plan of Care (Signed)
  Progressing Education: Knowledge of General Education information will improve 03/04/2017 0958 - Progressing by Kerrin Mo, RN Health Behavior/Discharge Planning: Ability to manage health-related needs will improve 03/04/2017 0958 - Progressing by Kerrin Mo, RN Clinical Measurements: Ability to maintain clinical measurements within normal limits will improve 03/04/2017 0958 - Progressing by Kerrin Mo, RN Will remain free from infection 03/04/2017 0958 - Progressing by Kerrin Mo, RN Note IV abx Diagnostic test results will improve 03/04/2017 0958 - Progressing by Kerrin Mo, RN Note CXR, venous blood gas, and labwork ordered for this AM. Will monitor.  Respiratory complications will improve 03/04/2017 0958 - Progressing by Kerrin Mo, RN Cardiovascular complication will be avoided 03/04/2017 9935 - Progressing by Kerrin Mo, RN Activity: Risk for activity intolerance will decrease 03/04/2017 0958 - Progressing by Kerrin Mo, RN Note For PT eval.  Nutrition: Adequate nutrition will be maintained 03/04/2017 0958 - Progressing by Kerrin Mo, RN Coping: Level of anxiety will decrease 03/04/2017 0958 - Progressing by Kerrin Mo, RN Elimination: Will not experience complications related to bowel motility 03/04/2017 0958 - Progressing by Kerrin Mo, RN Pain Managment: General experience of comfort will improve 03/04/2017 0958 - Progressing by Kerrin Mo, RN Safety: Ability to remain free from injury will improve 03/04/2017 0958 - Progressing by Kerrin Mo, RN Skin Integrity: Risk for impaired skin integrity will decrease 03/04/2017 0958 - Progressing by Kerrin Mo, RN Activity: Ability to tolerate increased activity will improve 03/04/2017 0958 - Progressing by Kerrin Mo, RN Clinical Measurements: Ability to maintain a body temperature in the normal  range will improve 03/04/2017 0958 - Progressing by Kerrin Mo, RN Respiratory: Ability to maintain adequate ventilation will improve 03/04/2017 0958 - Progressing by Kerrin Mo, RN Ability to maintain a clear airway will improve 03/04/2017 0958 - Progressing by Kerrin Mo, RN

## 2017-03-04 NOTE — Care Management Obs Status (Signed)
[facility] NOTIFICATION   Patient Details  Name: Patrick Holt MRN: [id number] Date of Birth: [birth year]   Medicare Observation Status Notification Given:  Yes    MahabirJuliann Pulse, RN 03/04/2017, 2:38 PM

## 2017-03-04 NOTE — Evaluation (Signed)
Physical Therapy Evaluation Patient Details Name: Patrick Holt MRN: [id number] DOB: 1950-09-06 Today's Date: 03/04/2017   History of Present Illness  Patrick Holt is a 67 y.o. male with medical history significant of COPD, HTN, A. fib not on anticoagulation, bipolar disorder, seizure disorder, dermatomyositis, CVA; who presents with complaints of weakness after having a fall  Clinical Impression  Pt admitted with above diagnosis. Pt currently with functional limitations due to the deficits listed below (see PT Problem List).  Pt will benefit from skilled PT to increase their independence and safety with mobility to allow discharge to the venue listed below.  Pt weaker than last admission ~1 wk ago, needs SNF (refused last admission), O2 sats 90% on 2L before and during activity; pt reports he has mostly been using his scooter for mobility, not walking much, sleeps on sofa; will follow     Follow Up Recommendations SNF(HHPT if refuses SNF)    Equipment Recommendations  None recommended by PT    Recommendations for Other Services       Precautions / Restrictions Precautions Precautions: Fall Restrictions Weight Bearing Restrictions: No      Mobility  Bed Mobility Overal bed mobility: Needs Assistance Bed Mobility: Supine to Sit     Supine to sit: Mod assist     General bed mobility comments: Incr time and HOB elevated, assist to bring LEs off bed and to elevate trunk  Transfers Overall transfer level: Needs assistance Equipment used: Rolling walker (2 wheeled) Transfers: Stand Pivot Transfers Sit to Stand: Mod assist Stand pivot transfers: Mod assist;Min assist       General transfer comment: assist to rise and stabilize, incr time needed, 3 attempts to come to full stand, heavy reliance on UEs  Ambulation/Gait             General Gait Details: NT--d/t pt agitation and c/o back pain with mobility (reportedly has not walked much at home since last  admission ~1wk ago)  Financial trader [employer] (Stroke Patients Only)       Balance Overall balance assessment: History of Falls Sitting-balance support: No upper extremity supported;Feet supported Sitting balance-Leahy Scale: Fair     Standing balance support: Bilateral upper extremity supported;During functional activity Standing balance-Leahy Scale: Poor Standing balance comment: requires UE support and external assist for safe balance                             Pertinent Vitals/Pain Pain Assessment: Faces Faces Pain Scale: Hurts even more Pain Location: back Pain Descriptors / Indicators: Aching Pain Intervention(s): Monitored during session    Home Living   Living Arrangements: Alone Available Help at Discharge: Family;Friend(s);Available PRN/intermittently Type of Home: Apartment Home Access: Elevator     Home Layout: One level Home Equipment: Walker - 2 wheels;Cane - single point;Agricultural engineer / Transfers Assistance Needed: amb short distances with RW and uses scooter for longer distances--pt reports he recently has been using scooter most all the time  ADL's / Homemaking Assistance Needed: Reports he is independent with ADL other than bathing and he has assistance from a neighbor to bathe his back. He is afraid of falling per his report and avoids the shower.         Hand Dominance  Extremity/Trunk Assessment   Upper Extremity Assessment Upper Extremity Assessment: Generalized weakness LUE Deficits / Details: recurrent L wrist fracture (per pt report); does not have brace with him    Lower Extremity Assessment Lower Extremity Assessment: Generalized weakness    Cervical / Trunk Assessment Cervical / Trunk Assessment: Other exceptions Cervical / Trunk Exceptions: excessive cervical flexion  Communication   Communication: No difficulties  Cognition  Arousal/Alertness: Awake/alert Behavior During Therapy: Agitated Overall Cognitive Status: No family/caregiver present to determine baseline cognitive functioning Area of Impairment: Safety/judgement;Memory                   Current Attention Level: Sustained Memory: Decreased short-term memory Following Commands: Follows one step commands with increased time Safety/Judgement: Decreased awareness of safety;Decreased awareness of deficits   Problem Solving: Slow processing;Difficulty sequencing General Comments: easily agitated, requires frequent redirection to task      General Comments      Exercises     Assessment/Plan    PT Assessment Patient needs continued PT services  PT Problem List Decreased strength;Decreased activity tolerance;Decreased balance;Decreased mobility;Decreased cognition;Decreased safety awareness;Pain       PT Treatment Interventions DME instruction;Gait training;Functional mobility training;Therapeutic exercise;Balance training;Therapeutic activities;Neuromuscular re-education;Cognitive remediation;Patient/family education    PT Goals (Current goals can be found in the Care Plan section)  Acute Rehab PT Goals Patient Stated Goal: to go home PT Goal Formulation: With patient Time For Goal Achievement: 03/11/17 Potential to Achieve Goals: Fair    Frequency Min 3X/week   Barriers to discharge        Co-evaluation               AM-PAC PT "6 Clicks" Daily Activity  Outcome Measure Difficulty turning over in bed (including adjusting bedclothes, sheets and blankets)?: Unable Difficulty moving from lying on back to sitting on the side of the bed? : Unable Difficulty sitting down on and standing up from a chair with arms (e.g., wheelchair, bedside commode, etc,.)?: Unable Help needed moving to and from a bed to chair (including a wheelchair)?: A Lot Help needed walking in hospital room?: A Lot Help needed climbing 3-5 steps with a  railing? : A Lot 6 Click Score: 9    End of Session Equipment Utilized During Treatment: Gait belt Activity Tolerance: Patient limited by fatigue;Patient limited by pain Patient left: with call bell/phone within reach;in chair;with chair alarm set   PT Visit Diagnosis: Repeated falls (R29.6);Unsteadiness on feet (R26.81)    Time: 2637-8588 PT Time Calculation (min) (ACUTE ONLY): 25 min   Charges:   PT Evaluation $PT Eval Moderate Complexity: 1 Mod PT Treatments $Therapeutic Activity: 8-22 mins   PT G Codes:          Charlen Bakula 17-Mar-2017, 10:02 AM

## 2017-03-04 NOTE — Progress Notes (Signed)
PROGRESS NOTE    Patrick Holt  ZJI:[id number] DOB: 1950-12-29 DOA: 03/03/2017 PCP: Medicine, [facility]   Brief Narrative:  Patrick Holt is a 67 y.o. male with medical history significant of COPD, HTN, A. fib not on anticoagulation, bipolar disorder, seizure disorder, dermatomyositis, CVA; who presents with complaints of weakness after having a fall.  Patient was just recently hospitalized from 1/6 -1/9 after being found with acute respiratory failure with hypoxia and hypercapnia requiring BiPAP.   Being treated for HCAP and possible volume overload.  Assessment & Plan:   Active Problems:   COPD (chronic obstructive pulmonary disease) (HCC)   Atrial fibrillation (HCC)   Bipolar 1 disorder (HCC)   Seizure disorder (HCC)   Acute respiratory failure with hypoxia (HCC)   Acute metabolic encephalopathy   Hypoglycemia   Acute respiratory failure with hypoxia, history of COPD  Possible HCAP:  Patient noted to have O2 saturations in the 80s on room air upon EMS arrival.  At baseline patient not on oxygen.  Venous blood gas showed no signs of hypercarbia.  O2 saturations maintained on 2 L of nasal cannula oxygen.  CXR on presentation with "streaky bibasilar atelectasis vs mild infiltrates".  Repeat CXR this morning with low lung volumes but persistent bibasilar atelectasis/infiltrates "findings have progressed from prior exam".  - Pt with low grade temp and progressive findings, will continue abx for possible HCAP - Vanc/cefepime [ ]  blood cultures, sputum cx, urine strep, urine legionella - Admit to telemetry bed - Continuous pulse oximetry with nasal cannula oxygen to keep O2 saturation greater than 92% - Brovana and budesonide nebs, no steroids for now without wheezing - DuoNeb's  Diastolic CHF: Last EF noted to be 65-70% with grade 1 diastolic dysfunction on 17/51/0258.  Chest x-ray initially with cardiomegaly and vascular congestion, but that has improved today.   BNP noted to be slightly higher than previous at 77.2.   - Strict I&Os and daily weights - Will hold off additional fluids  - Lasix IV 20 mg BID as tolerated (reorder tomorrow prn) - Restart oral Lasix when able  Acute metabolic encephalopathy: Acute.  A&Ox3-4 now, but occasionally agitated.  Asking to go home at one point, but does not currently have capacity due to his AMS (discussed and he calmed down and staying).  Patient was initially noted to be hypoxic and initial CBG noted glucose of 71.  Ammonia normal.  Utox with benzos (he takes at home).  Suspect likely multifactorial, possibly driven by possible infection above. - repeat VBG - Neurochecks - check UDS - benzos (low threshold to hold his home xanax if this seems to make things worse) - check valproic acid level - Monitor for seizure activity  - Check ammonia level - wnl - treat above  Hypoglycemia: Patient's initial CBG was noted to be 71.  Patient was given 25 mL of 50% dextrose with blood sugar.  Improved. - continue to monitor  Elevated CK: Patient found to have mildly elevated CK of 1612 on admission.  Review of records shows that CPK levels were routinely elevated and may related to history of dermatomyositis.  Less likely rhabdomyolysis as urinalysis does not show any signs of hemoglobin without RBCs to suggest myoglobin. - Recheck CPK improved - follow tomorrow  History of dermatomyositis: Patient currently not on any treatment for this condition and is monitored by rheumatology. - Continue outpatient follow-up with rheumatology  History of seizure disorder  - seizure precautions - Continue Keppra  Paroxysmal atrial fibrillation: Patient appears to be in sinus rhythm at this time.  Not on anticoagulation due to recurrent falls and seizure history. - Continue amiodarone  Essential hypertension - Hold amlodipine due to soft blood pressures  History of bipolar disorder: Stable - continue  Depakote  Hyperlipidemia - Continue atorvastatin  DVT prophylaxis: lovenox Code Status: full  Family Communication: none at bedside Disposition Plan: (pending   Consultants:   none  Procedures:   none  Antimicrobials: Anti-infectives (From admission, onward)   Start     Dose/Rate Route Frequency Ordered Stop   03/04/17 1500  ceFEPIme (MAXIPIME) 1 g in dextrose 5 % 50 mL IVPB     1 g 100 mL/hr over 30 Minutes Intravenous Every 8 hours 03/04/17 1445 03/12/17 1359   03/03/17 2000  ceFEPIme (MAXIPIME) 2 g in dextrose 5 % 50 mL IVPB     2 g 100 mL/hr over 30 Minutes Intravenous  Once 03/03/17 1946 03/03/17 2202   03/03/17 2000  vancomycin (VANCOCIN) IVPB 1000 mg/200 mL premix     1,000 mg 200 mL/hr over 60 Minutes Intravenous  Once 03/03/17 1950 03/03/17 2320         Subjective: Denies SOB or fevers. Fell after he bent over and was unable to get back up. Feels sleepy, but unclear why.  Maybe med related?  Objective: Vitals:   03/03/17 2148 03/03/17 2240 03/04/17 0520 03/04/17 0748  BP: 91/65 (!) 103/48 110/65   Pulse: 76 67 70   Resp: 16  18   Temp:  98.9 F (37.2 C) 99.6 F (37.6 C)   TempSrc:  Oral Oral   SpO2: 92% 97% 91% 90%  Weight:  102.2 kg (225 lb 5 oz) 102.2 kg (225 lb 5 oz)   Height:  5\' 3"  (1.6 m)      Intake/Output Summary (Last 24 hours) at 03/04/2017 0942 Last data filed at 03/04/2017 0908 Gross per 24 hour  Intake 410 ml  Output 750 ml  Net -340 ml   Filed Weights   03/03/17 1541 03/03/17 2240 03/04/17 0520  Weight: 106.6 kg (235 lb) 102.2 kg (225 lb 5 oz) 102.2 kg (225 lb 5 oz)    Examination:  General exam: Appears calm and comfortable, drowsy Respiratory system: Clear to auscultation. Crackles at bases. Cardiovascular system: S1 & S2 heard, RRR. No JVD, murmurs, rubs, gallops or clicks. No pedal edema. Gastrointestinal system: Abdomen is nondistended, soft and nontender. No organomegaly or masses felt. Normal bowel sounds  heard. Central nervous system: Alert and oriented. No focal neurological deficits. Skin: No rashes, lesions or ulcers Psychiatry: Judgement and insight appear normal. Mood & affect appropriate.     Data Reviewed: I have personally reviewed following labs and imaging studies  CBC: Recent Labs  Lab 03/03/17 1709 03/03/17 1727 03/04/17 0522  WBC 8.3  --  6.2  NEUTROABS 5.3  --   --   HGB 13.1 13.9 12.4*  HCT 41.7 41.0 40.3  MCV 86.5  --  87.0  PLT 239  --  573   Basic Metabolic Panel: Recent Labs  Lab 03/03/17 1709 03/03/17 1727 03/04/17 0522  NA 140 142 138  K 3.9 4.0 4.2  CL 104 102 104  CO2 29  --  28  GLUCOSE 75 71 92  BUN 15 14 13   CREATININE 1.07 1.10 1.02  CALCIUM 8.3*  --  7.8*   GFR: Estimated Creatinine Clearance: 73.5 mL/min (by C-G formula based on SCr of 1.02 mg/dL). Liver  Function Tests: Recent Labs  Lab 03/03/17 1709  AST 40  ALT 25  ALKPHOS 68  BILITOT 1.1  PROT 6.0*  ALBUMIN 3.1*   No results for input(s): LIPASE, AMYLASE in the last 168 hours. Recent Labs  Lab 03/03/17 2225  AMMONIA 34   Coagulation Profile: No results for input(s): INR, PROTIME in the last 168 hours. Cardiac Enzymes: Recent Labs  Lab 03/03/17 1709 03/03/17 1745 03/04/17 0522  CKTOTAL 1,612*  --  998*  TROPONINI  --  <0.03  --    BNP (last 3 results) No results for input(s): PROBNP in the last 8760 hours. HbA1C: No results for input(s): HGBA1C in the last 72 hours. CBG: Recent Labs  Lab 03/03/17 1722 03/03/17 1900 03/04/17 0002 03/04/17 0519 03/04/17 0723  GLUCAP 69 73 107* 98 92   Lipid Profile: No results for input(s): CHOL, HDL, LDLCALC, TRIG, CHOLHDL, LDLDIRECT in the last 72 hours. Thyroid Function Tests: No results for input(s): TSH, T4TOTAL, FREET4, T3FREE, THYROIDAB in the last 72 hours. Anemia Panel: No results for input(s): VITAMINB12, FOLATE, FERRITIN, TIBC, IRON, RETICCTPCT in the last 72 hours. Sepsis Labs: Recent Labs  Lab  03/03/17 1727  LATICACIDVEN 0.89    No results found for this or any previous visit (from the past 240 hour(s)).       Radiology Studies: Dg Chest 2 View  Result Date: 03/03/2017 CLINICAL DATA:  Generalize weakness EXAM: CHEST  2 VIEW COMPARISON:  06-25-202019 FINDINGS: Cardiomegaly with vascular congestion. Streaky bibasilar atelectasis or infiltrates. Aortic atherosclerosis. No pneumothorax. Degenerative changes of the spine. IMPRESSION: 1. Cardiomegaly with vascular congestion 2. Streaky bibasilar atelectasis versus mild infiltrates. Electronically Signed   By: Donavan Foil M.D.   On: 03/03/2017 16:47   Ct Head Wo Contrast  Result Date: 03/03/2017 CLINICAL DATA:  Altered level of consciousness EXAM: CT HEAD WITHOUT CONTRAST TECHNIQUE: Contiguous axial images were obtained from the base of the skull through the vertex without intravenous contrast. COMPARISON:  CT brain 06-25-202019, 02/05/2017 FINDINGS: Brain: No acute territorial infarction, hemorrhage or intracranial mass is visualized. Old lacunar infarct in the central pons. Moderate vessel ischemic changes of the white matter. Mild to moderate atrophy. Stable ventricle size Vascular: No hyperdense vessels.  Carotid artery calcification Skull: Normal. Negative for fracture or focal lesion. Sinuses/Orbits: Mild mucosal thickening in the ethmoid sinuses. No acute orbital abnormality. Other: None. IMPRESSION: 1. No CT evidence for acute intracranial abnormality. 2. Atrophy and small vessel ischemic changes of the white matter. Electronically Signed   By: Donavan Foil M.D.   On: 03/03/2017 19:45        Scheduled Meds: . amiodarone  200 mg Oral Daily  . arformoterol  15 mcg Nebulization BID  . aspirin EC  81 mg Oral Daily  . atorvastatin  40 mg Oral Daily  . budesonide (PULMICORT) nebulizer solution  0.5 mg Nebulization BID  . enoxaparin (LOVENOX) injection  40 mg Subcutaneous QHS  . furosemide  20 mg Intravenous BID  . guaiFENesin   600 mg Oral BID  . ipratropium-albuterol  3 mL Nebulization Once  . ipratropium-albuterol  3 mL Inhalation TID  . levETIRAcetam  500 mg Oral BID  . pantoprazole  40 mg Oral Daily  . potassium chloride SA  20 mEq Oral Daily   Continuous Infusions:   LOS: 0 days    Time spent: over 30 minutes    Fayrene Helper, MD [facility] Pager [phone]  If 7PM-7AM, please contact night-coverage [email or web address] Password [facility] 03/04/2017, 9:42  AM

## 2017-03-04 NOTE — Care Management Note (Signed)
Case Management Note  Patient Details  Name: TIEN SPOONER MRN: [id number] Date of Birth: 02-Sep-1950  Subjective/Objective: 67 y/o m admitted w/Acute respiratory failure. From home-senior living apt. Patient declines SNF. Provided w/HHC agency list-await choice. He says he has an aide-Patient gave permission to call-Caroline Pritchett [phone]-left vm w/my call back #. Attempted to call emergency contact on chart-no vm set up to leave a message. On 02-will monitor if needed for home. Will need [facility] ambulance transp @ d/c.                   Action/Plan:d/c plan home w/HHC/[facility]   Expected Discharge Date:  (unknown)               Expected Discharge Plan:  [place]  In-House Referral:     Discharge planning Services  CM Consult  Post Acute Care Choice:    Choice offered to:  Patient  DME Arranged:    DME Agency:     HH Arranged:    [place] Agency:     Status of Service:  In process, will continue to follow  If discussed at Long Length of Stay Meetings, dates discussed:    Additional Comments:  Dessa Phi, RN 03/04/2017, 2:38 PM

## 2017-03-05 ENCOUNTER — Inpatient Hospital Stay (HOSPITAL_COMMUNITY): Payer: Medicare Other

## 2017-03-05 DIAGNOSIS — Z818 Family history of other mental and behavioral disorders: Secondary | ICD-10-CM

## 2017-03-05 DIAGNOSIS — J9601 Acute respiratory failure with hypoxia: Secondary | ICD-10-CM

## 2017-03-05 DIAGNOSIS — Z736 Limitation of activities due to disability: Secondary | ICD-10-CM

## 2017-03-05 DIAGNOSIS — J189 Pneumonia, unspecified organism: Principal | ICD-10-CM

## 2017-03-05 DIAGNOSIS — Z811 Family history of alcohol abuse and dependence: Secondary | ICD-10-CM

## 2017-03-05 DIAGNOSIS — Z87891 Personal history of nicotine dependence: Secondary | ICD-10-CM

## 2017-03-05 LAB — PROCALCITONIN: Procalcitonin: 0.17 ng/mL

## 2017-03-05 LAB — LEGIONELLA PNEUMOPHILA SEROGP 1 UR AG: L. pneumophila Serogp 1 Ur Ag: NEGATIVE

## 2017-03-05 LAB — COMPREHENSIVE METABOLIC PANEL
ALBUMIN: 2.6 g/dL — AB (ref 3.5–5.0)
ALK PHOS: 62 U/L (ref 38–126)
ALT: 21 U/L (ref 17–63)
AST: 26 U/L (ref 15–41)
Anion gap: 6 (ref 5–15)
BILIRUBIN TOTAL: 0.8 mg/dL (ref 0.3–1.2)
BUN: 12 mg/dL (ref 6–20)
CO2: 29 mmol/L (ref 22–32)
Calcium: 7.8 mg/dL — ABNORMAL LOW (ref 8.9–10.3)
Chloride: 103 mmol/L (ref 101–111)
Creatinine, Ser: 1.24 mg/dL (ref 0.61–1.24)
GFR calc Af Amer: >60 mL/min (ref >60)
GFR calc non Af Amer: 58 mL/min — ABNORMAL LOW (ref >60)
Glucose, Bld: 112 mg/dL — ABNORMAL HIGH (ref 65–99)
POTASSIUM: 4 mmol/L (ref 3.5–5.1)
Sodium: 138 mmol/L (ref 135–145)
TOTAL PROTEIN: 5.5 g/dL — AB (ref 6.5–8.1)

## 2017-03-05 LAB — URINE CULTURE: Special Requests: NORMAL

## 2017-03-05 LAB — MRSA PCR SCREENING: MRSA by PCR: NEGATIVE

## 2017-03-05 LAB — GLUCOSE, CAPILLARY: Glucose-Capillary: 141 mg/dL — ABNORMAL HIGH (ref 65–99)

## 2017-03-05 LAB — CBC
HEMATOCRIT: 42.1 % (ref 39.0–52.0)
HEMOGLOBIN: 12.8 g/dL — AB (ref 13.0–17.0)
MCH: 26.9 pg (ref 26.0–34.0)
MCHC: 30.4 g/dL (ref 30.0–36.0)
MCV: 88.6 fL (ref 78.0–100.0)
Platelets: 206 10*3/uL (ref 150–400)
RBC: 4.75 MIL/uL (ref 4.22–5.81)
RDW: 21.3 % — AB (ref 11.5–15.5)
WBC: 7.6 10*3/uL (ref 4.0–10.5)

## 2017-03-05 LAB — CK: CK TOTAL: 349 U/L (ref 49–397)

## 2017-03-05 MED ORDER — IOPAMIDOL (ISOVUE-370) INJECTION 76%
INTRAVENOUS | Status: AC
  Administered 2017-03-05: 100 mL
  Filled 2017-03-05: qty 100

## 2017-03-05 MED ORDER — DIPHENHYDRAMINE HCL 50 MG/ML IJ SOLN
25.0000 mg | Freq: Four times a day (QID) | INTRAMUSCULAR | Status: DC | PRN
Start: 2017-03-05 — End: 2017-03-05
  Administered 2017-03-05: 25 mg via INTRAVENOUS
  Filled 2017-03-05: qty 1

## 2017-03-05 MED ORDER — FUROSEMIDE 10 MG/ML IJ SOLN
20.0000 mg | Freq: Two times a day (BID) | INTRAMUSCULAR | Status: DC
  Administered 2017-03-05 – 2017-03-07 (×4): 20 mg via INTRAVENOUS
  Filled 2017-03-05 (×4): qty 2

## 2017-03-05 MED ORDER — DIPHENHYDRAMINE HCL 25 MG PO CAPS: 25.0000 mg | ORAL_CAPSULE | Freq: Four times a day (QID) | ORAL | Status: DC | PRN

## 2017-03-05 MED ORDER — DIPHENHYDRAMINE HCL 25 MG PO CAPS
25.0000 mg | ORAL_CAPSULE | Freq: Once | ORAL | Status: AC
Start: 2017-03-05 — End: 2017-03-05
  Administered 2017-03-05: 25 mg via ORAL
  Filled 2017-03-05: qty 1

## 2017-03-05 NOTE — Progress Notes (Signed)
MD Blount aware of pts rash on abdomen, back, legs, and arms.  Rosine Beat, RN

## 2017-03-05 NOTE — Progress Notes (Signed)
Entered room to give [facility] treatments and patient stated that he did not want treatments and wants to know why I keep coming back asking the same question regarding the treatments.  Educated on the reason for my visit and the MD orders and patient stated that he still did not want treatments.

## 2017-03-05 NOTE — Progress Notes (Signed)
Upon entering room pt noted to have removed his oxygen.  O2 sat 81% on RA at rest.  Pt placed on 2lnc, sat increased to 86%.  O2 sat 92% on 3lnc.  Reviewed importance of leaving [place] in place with pt who verbalizes understanding.

## 2017-03-05 NOTE — Consult Note (Signed)
[facility] Face-to-Face Psychiatry Consult   Reason for Consult:  Capacity Referring Physician:  Dr. Florene Glen Patient Identification: Patrick Holt MRN:  [id number] Principal Diagnosis: Acute respiratory failure with hypoxia [facility]) Diagnosis:   Patient Active Problem List   Diagnosis Date Noted  . Acute metabolic encephalopathy [S56.81] 03/04/2017  . Hypoglycemia [E16.2] 03/04/2017  . Acute respiratory failure with hypoxia ([place]) [J96.01] 2020-07-1217  . Acute encephalopathy [G93.40] 2020-07-1217  . Rhabdomyolysis [M62.82] 02/05/2017  . Acute respiratory failure with hypoxia and hypercapnia ([place]) [J96.01, J96.02] 02/05/2017  . COPD with acute exacerbation ([place]) [J44.1] 02/05/2017  . Bipolar 1 disorder ([place]) [F31.9] 02/05/2017  . Anemia [D64.9] 02/05/2017  . Hypertension [I10] 02/05/2017  . Hand fracture, right [S62.91XA] 02/05/2017  . Narcotic dependence ([place]) [F11.20] 02/05/2017  . Pre-diabetes [R73.03] 02/05/2017  . PAF (paroxysmal atrial fibrillation) ([place]) [I48.0] 02/05/2017  . Frequent falls [R29.6] 02/05/2017  . History of dermatomyositis [Z87.2] 02/05/2017  . Seizure disorder ([place]) [E75.170] 02/05/2017  . Bilateral lower extremity edema [R60.0] 02/05/2017  . Pulmonary artery hypertension ([place]) [I27.21] 01/15/2017  . Altered mental status [R41.82] 01/13/2017  . Low oxygen saturation [R79.81]   . Pain in right wrist [M25.531] 02/27/2016  . Altered mental state [R41.82] 11/14/2015  . Severe dehydration [E86.0] 11/14/2015  . Hypernatremia [E87.0] 11/14/2015  . Difficulty in walking, not elsewhere classified [R26.2]   . Right wrist fracture [S62.101A]   . Closed displaced fracture of ulnar styloid with routine healing [S52.613D]   . Scapholunate ligament injury with no instability [S69.90XA]   . Falls frequently [R29.6]   . Hypoxia [R09.02] 11/02/2015  . Anxiety [F41.9] 06/06/2015  . Impaired mobility [Z74.09] 04/28/2015  . Lumbar spondylosis with myelopathy [M47.16] 03/07/2015  .  Nocturia [R35.1] 12/03/2014  . Chronic venous insufficiency [I87.2] 09/23/2014  . Poor social situation [Z65.9] 07/30/2014  . Schizophrenia, acute ([place]) [F23]   . Schizoaffective disorder, bipolar type ([place]) [F25.0]   . Decreased visual acuity [H54.7] 06/27/2014  . COPD exacerbation ([place]) [J44.1]   . Right sided weakness [R53.1] 01/04/2014  . Essential hypertension, benign [I10]   . Atrial fibrillation, unspecified [I48.91]   . Radiculopathy of cervical region [M54.12]   . Polyuria [R35.8] 12/07/2013  . Erectile dysfunction [N52.9] 12/07/2013  . Diverticulitis [K57.92] 08/01/2013  . Acute diverticulitis [K57.92] 08/01/2013  . Hematochezia [K92.1] 06/04/2013  . Hypokalemia [E87.6] 10/03/2012  . Benign neoplasm of colon [D12.6] 09/27/2012  . Stricture and stenosis of esophagus [K22.2] 09/27/2012  . Diverticulitis of colon without hemorrhage [K57.32] 07/26/2012  . Polypharmacy [Z79.899] 03/26/2012  . Seizures ([place]) [R56.9] 02/10/2012  . Atrial fibrillation ([place]) [I48.91] 02/05/2012  . Coronary atherosclerosis of native coronary artery [I25.10] 02/05/2012  . Radicular low back pain [M54.10] 03/09/2011  . History of seizure disorder [Z86.69] 01/29/2011  . Irritable bowel syndrome (IBS) [K58.9] 06/24/2010  . Urge incontinence [N39.41] 06/19/2010  . TOBACCO USER [F17.200] 11/23/2008  . DEPRESSION, MAJOR [F32.9] 05/31/2008  . ISCHEMIC HEART DISEASE [I25.9] 05/31/2008  . RAYNAUD'S DISEASE [I73.00] 05/31/2008  . HIATAL HERNIA [K44.9] 05/31/2008  . Rheumatoid arthritis ([place]) [M06.9] 05/31/2008  . FIBROMYALGIA [IMO0001] 05/31/2008  . Hyperlipidemia [E78.5] 01/26/2008  . Anxiety state [F41.1] 01/26/2008  . HEMORRHOIDS, INTERNAL [K64.8] 01/26/2008  . COPD (chronic obstructive pulmonary disease) ([place]) [J44.9] 01/26/2008  . ESOPHAGEAL MOTILITY DISORDER [K22.4] 01/26/2008  . GERD [K21.9] 01/26/2008  . 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 male patient admitted with  pneumonia  HPI:  Patient with history of COPD, HTN, A. fib not on anticoagulation, bipolar disorder, seizure disorder, dermatomyositis, CVA; who presents with complaints of weakness after  a fall. Patient reports that he lives in a senior living facility alone with other seniors. He reports that he has been having frequent falls and dealing with multiple medical problem. Patient is currently being evaluated for pulmonary embolism and other health issues but has been demanding to be discharged home to pay his bills. Patient refused social worker intervention to help with his bills. He states that he does not care if he lives or die and unable to understand the consequences of not accepting treatment. He denies psychosis but he is extremely agitated and irritable.  Past Psychiatric History: as above  Risk to Self: Is patient at risk for suicide?: No Risk to Others:   Prior Inpatient Therapy:   Prior Outpatient Therapy:    Past Medical History:  Past Medical History:  Diagnosis Date  . Anemia   . Anxiety   . Asthma   . Atrial fibrillation ([place]) 01/2012   with RVR  . Bipolar 1 disorder ([place])   . Cancer ([place])   . Collagen vascular disease ([place])   . Conversion disorder 06/2010  . COPD (chronic obstructive pulmonary disease) ([place])   . Dermatomyositis ([place])   . Dermatomyositis ([place])   . Diverticulitis   . Diverticulosis of colon   . Dysrhythmia    "irregular" (11/15/2012)  . Esophageal dysmotility   . Esophageal stricture   . Fibromyalgia   . Gastritis   . GERD (gastroesophageal reflux disease)   . Hand fracture, right    sept 13, 2017, still wearing splint as og 01-10-2017  . Headache(784.0)    "severe; get them often" (11/15/2012)  . Hiatal hernia   . History of narcotic addiction ([place])   . Hx of adenomatous colonic polyps   . Hyperlipidemia   . Hypertension   . Internal hemorrhoids   . Ischemic  heart disease   . Major depression    with acute psychotic break in 06/2010  . Myocardial infarct ([place])    mulitple (1999, 2000, 2004)  . Narcotic dependence ([place])   . Nephrolithiasis   . Obesity   . OCD (obsessive compulsive disorder)   . Otosclerosis   . Paroxysmal A-fib ([place])   . Peripheral neuropathy   . Raynaud's disease   . Renal insufficiency   . Rheumatoid arthritis(714.0)   . Sarcoidosis   . Seizures ([place])   . Subarachnoid hemorrhage ([place]) 01/2012   with subdural  hematoma.   . Type II diabetes mellitus ([place])   . Urge incontinence   . Vertigo     Past Surgical History:  Procedure Laterality Date  . BACK SURGERY    . CARDIAC CATHETERIZATION    . CARPAL TUNNEL RELEASE Bilateral   . CATARACT EXTRACTION W/ INTRAOCULAR LENS IMPLANT Left   . COLONOSCOPY N/A 09/27/2012   Procedure: COLONOSCOPY;  Surgeon: Lafayette Dragon, MD;  Location: [facility] ENDOSCOPY;  Service: Endoscopy;  Laterality: N/A;  . ESOPHAGOGASTRODUODENOSCOPY N/A 09/27/2012   Procedure: ESOPHAGOGASTRODUODENOSCOPY (EGD);  Surgeon: Lafayette Dragon, MD;  Location: [facility];  Service: Endoscopy;  Laterality: N/A;  . KNEE ARTHROSCOPY W/ MENISCAL REPAIR Left 2009  . LUMBAR DISC SURGERY    . LYMPH NODE DISSECTION Right 1970's   "neck; dr thought I had Hodgkins; turned out to be sarcoidosis" (11/15/2012)  . squamous papilloma   2010   removed by Dr.  [facility] ENT, noted on tongue  . TONSILLECTOMY     Family History:  Family History  Problem Relation Age of Onset  . Alcohol abuse Mother   . Depression Mother   . Heart disease Mother   . Diabetes Mother   . Stroke Mother   . Coronary artery disease Father   . Diabetes Other        1/2 brother  . Hepatitis Brother    Family Psychiatric  History: Social History:  Social History   Substance and Sexual Activity  Alcohol Use No  . Alcohol/week: 0.0 oz   Comment: Alcohol stopped in September of 2014     Social History   Substance and Sexual Activity  Drug Use No     Social History   Socioeconomic History  . Marital status: Divorced    Spouse name: None  . Number of children: None  . Years of education: None  . Highest education level: None  Social Needs  . Financial resource strain: None  . Food insecurity - worry: None  . Food insecurity - inability: None  . Transportation needs - medical: None  . Transportation needs - non-medical: None  Occupational History  . Occupation: disabled  Tobacco Use  . Smoking status: Former Smoker    Packs/day: 0.50    Years: 30.00    Pack years: 15.00    Types: Cigarettes    Last attempt to quit: 12/15/2014    Years since quitting: 2.2  . Smokeless tobacco: Never Used  . Tobacco comment: 3 cigs a day  Substance and Sexual Activity  . Alcohol use: No    Alcohol/week: 0.0 oz    Comment: Alcohol stopped in September of 2014  . Drug use: No  . Sexual activity: No  Other Topics Concern  . None  Social History Narrative   The patient was born and raised by both his biological parents in the [place] area. His parents did separate in the past but never divorced. He denies any history of any physical or sexual abuse. The patient says he dropped out of school in the 12th grade. 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 He has never been married and has no children. He currently lives alone in a duplex in [place].   Additional Social History:    Allergies:   Allergies  Allergen Reactions  . Immune Globulins Other (See Comments)    Acute renal failure  . Ciprofloxacin Swelling  . Flagyl [Metronidazole] Swelling  . Lisinopril Diarrhea  . Sulfa Antibiotics Other (See Comments)    blisters  . Morphine And Related     Does not help with pain  . Requip [Ropinirole Hcl]     constipation  . Betamethasone Dipropionate Other (See Comments)    Unknown  . Bupropion Hcl Other (See Comments)    Unknown  . Clobetasol Other  (See Comments)    Unknown  . Codeine Other (See Comments)    Does not help with pain  . Escitalopram Oxalate Other (See Comments)    Unknown  . Fluoxetine Hcl Other (See Comments)    Unknown  . Furosemide Other (See Comments)    Unknown  . Meclizine Rash  . Paroxetine Other (See Comments)    Unknown  . Penicillins Other (See Comments)    Unknown reaction Has patient had a PCN reaction causing immediate rash, facial/tongue/throat swelling, SOB or lightheadedness with hypotension: unknown  Has patient had a PCN reaction causing severe rash involving mucus membranes or skin necrosis: unknown Has patient had a PCN reaction that required hospitalization unknown Has patient had a PCN reaction occurring within the last 10 years: unknown If all of the above answers are "NO", then may proceed with Cephalosporin use. Unknown Unknown reactio  . Tacrolimus Other (See Comments)    Unknown  . Tetanus Toxoid Other (See Comments)    Unknown  . Tuberculin Purified Protein Derivative Other (See Comments)    Unknown    Labs:  Results for orders placed or performed during the hospital encounter of 03/03/17 (from the past 48 hour(s))  Comprehensive metabolic panel     Status: Abnormal   Collection Time: 03/03/17  5:09 PM  Result Value Ref Range   Sodium 140 135 - 145 mmol/L   Potassium 3.9 3.5 - 5.1 mmol/L   Chloride 104 101 - 111 mmol/L   CO2 29 22 - 32 mmol/L   Glucose, Bld 75 65 - 99 mg/dL   BUN 15 6 - 20 mg/dL   Creatinine, Ser 1.07 0.61 - 1.24 mg/dL   Calcium 8.3 (L) 8.9 - 10.3 mg/dL   Total Protein 6.0 (L) 6.5 - 8.1 g/dL   Albumin 3.1 (L) 3.5 - 5.0 g/dL   AST 40 15 - 41 U/L   ALT 25 17 - 63 U/L   Alkaline Phosphatase 68 38 - 126 U/L   Total Bilirubin 1.1 0.3 - 1.2 mg/dL   GFR calc non Af Amer >60 >60 mL/min   GFR calc Af Amer >60 >60 mL/min    Comment: (NOTE) The eGFR has been calculated using the CKD EPI equation. This calculation has not been validated in all clinical  situations. eGFR's persistently <60 mL/min signify possible Chronic Kidney Disease.    Anion gap 7 5 - 15  Ethanol     Status: None   Collection Time: 03/03/17  5:09 PM  Result Value Ref Range   Alcohol, Ethyl (B) <10 <10 mg/dL    Comment:        LOWEST DETECTABLE LIMIT FOR SERUM ALCOHOL IS 10 mg/dL FOR MEDICAL PURPOSES ONLY   Acetaminophen level     Status: Abnormal   Collection Time: 03/03/17  5:09 PM  Result Value Ref Range   Acetaminophen (Tylenol), Serum <10 (L) 10 - 30 ug/mL    Comment:        THERAPEUTIC CONCENTRATIONS VARY SIGNIFICANTLY. A RANGE OF 10-30 ug/mL MAY BE AN EFFECTIVE CONCENTRATION FOR MANY PATIENTS. HOWEVER, SOME ARE BEST TREATED AT CONCENTRATIONS OUTSIDE THIS RANGE. ACETAMINOPHEN CONCENTRATIONS >150 ug/mL AT 4 HOURS AFTER INGESTION AND >50 ug/mL AT 12 HOURS AFTER INGESTION ARE OFTEN ASSOCIATED WITH TOXIC REACTIONS.   Salicylate level     Status: None   Collection Time: 03/03/17  5:09 PM  Result Value Ref Range   Salicylate Lvl <6.8 2.8 - 30.0 mg/dL  CBC with Differential     Status: Abnormal   Collection Time: 03/03/17  5:09 PM  Result Value Ref Range   WBC 8.3 4.0 - 10.5 K/uL   RBC 4.82 4.22 - 5.81 MIL/uL   Hemoglobin 13.1 13.0 - 17.0 g/dL   HCT 41.7 39.0 - 52.0 %   MCV 86.5 78.0 - 100.0 fL   MCH 27.2 26.0 - 34.0 pg   MCHC 31.4 30.0 - 36.0 g/dL   RDW 20.6 (H) 11.5 - 15.5 %   Platelets 239 150 - 400 K/uL   Neutrophils Relative % 63 %  Neutro Abs 5.3 1.7 - 7.7 K/uL   Lymphocytes Relative 24 %   Lymphs Abs 2.0 0.7 - 4.0 K/uL   Monocytes Relative 12 %   Monocytes Absolute 1.0 0.1 - 1.0 K/uL   Eosinophils Relative 1 %   Eosinophils Absolute 0.1 0.0 - 0.7 K/uL   Basophils Relative 0 %   Basophils Absolute 0.0 0.0 - 0.1 K/uL  CK     Status: Abnormal   Collection Time: 03/03/17  5:09 PM  Result Value Ref Range   Total CK 1,612 (H) 49 - 397 U/L  Brain natriuretic peptide     Status: None   Collection Time: 03/03/17  5:09 PM  Result Value  Ref Range   B Natriuretic Peptide 77.2 0.0 - 100.0 pg/mL  Culture, blood (routine x 2)     Status: None (Preliminary result)   Collection Time: 03/03/17  5:15 PM  Result Value Ref Range   Specimen Description BLOOD LEFT ANTECUBITAL    Special Requests      BOTTLES DRAWN AEROBIC AND ANAEROBIC Blood Culture adequate volume   Culture      NO GROWTH < 12 HOURS Performed at [facility], [address], [place], [place] [address]    Report Status PENDING   CBG monitoring, ED     Status: None   Collection Time: 03/03/17  5:22 PM  Result Value Ref Range   Glucose-Capillary 69 65 - 99 mg/dL  I-stat troponin, ED     Status: None   Collection Time: 03/03/17  5:26 PM  Result Value Ref Range   Troponin i, poc 0.01 0.00 - 0.08 ng/mL   Comment 3            Comment: Due to the release kinetics of cTnI, a negative result within the first hours of the onset of symptoms does not rule out myocardial infarction with certainty. If myocardial infarction is still suspected, repeat the test at appropriate intervals.   I-Stat CG4 Lactic Acid, ED     Status: None   Collection Time: 03/03/17  5:27 PM  Result Value Ref Range   Lactic Acid, Venous 0.89 0.5 - 1.9 mmol/L  I-stat Chem 8, ED     Status: Abnormal   Collection Time: 03/03/17  5:27 PM  Result Value Ref Range   Sodium 142 135 - 145 mmol/L   Potassium 4.0 3.5 - 5.1 mmol/L   Chloride 102 101 - 111 mmol/L   BUN 14 6 - 20 mg/dL   Creatinine, Ser 1.10 0.61 - 1.24 mg/dL   Glucose, Bld 71 65 - 99 mg/dL   Calcium, Ion 1.10 (L) 1.15 - 1.40 mmol/L   TCO2 30 22 - 32 mmol/L   Hemoglobin 13.9 13.0 - 17.0 g/dL   HCT 41.0 39.0 - 52.0 %  Blood gas, venous     Status: Abnormal   Collection Time: 03/03/17  5:34 PM  Result Value Ref Range   pH, Ven 7.375 7.250 - 7.430   pCO2, Ven 54.8 44.0 - 60.0 mmHg   pO2, Ven 34.7 32.0 - 45.0 mmHg   Bicarbonate 31.3 (H) 20.0 - 28.0 mmol/L   Acid-Base Excess 5.1 (H) 0.0 - 2.0 mmol/L   O2 Saturation 60.7 %    Patient temperature 98.6    Collection site DRAWN BY RN    Drawn by [phone]    Sample type VENOUS   Culture, blood (routine x 2)     Status: None (Preliminary result)   Collection Time:  03/03/17  5:40 PM  Result Value Ref Range   Specimen Description BLOOD LEFT HAND    Special Requests IN PEDIATRIC BOTTLE Blood Culture adequate volume    Culture      NO GROWTH < 12 HOURS Performed at [facility] [address], [place], [place] [address]    Report Status PENDING   Troponin I     Status: None   Collection Time: 03/03/17  5:45 PM  Result Value Ref Range   Troponin I <0.03 <0.03 ng/mL  Urinalysis, Routine w reflex microscopic     Status: Abnormal   Collection Time: 03/03/17  6:39 PM  Result Value Ref Range   Color, Urine YELLOW YELLOW   APPearance CLEAR CLEAR   Specific Gravity, Urine 1.017 1.005 - 1.030   pH 8.0 5.0 - 8.0   Glucose, UA 50 (A) NEGATIVE mg/dL   Hgb urine dipstick NEGATIVE NEGATIVE   Bilirubin Urine NEGATIVE NEGATIVE   Ketones, ur 20 (A) NEGATIVE mg/dL   Protein, ur NEGATIVE NEGATIVE mg/dL   Nitrite NEGATIVE NEGATIVE   Leukocytes, UA NEGATIVE NEGATIVE  Urine culture     Status: Abnormal   Collection Time: 03/03/17  6:39 PM  Result Value Ref Range   Specimen Description URINE, CLEAN CATCH    Special Requests Normal    Culture MULTIPLE SPECIES PRESENT, SUGGEST RECOLLECTION (A)    Report Status 03/05/2017 FINAL   CBG monitoring, ED     Status: None   Collection Time: 03/03/17  7:00 PM  Result Value Ref Range   Glucose-Capillary 73 65 - 99 mg/dL  Ammonia     Status: None   Collection Time: 03/03/17 10:25 PM  Result Value Ref Range   Ammonia 34 9 - 35 umol/L  Glucose, capillary     Status: Abnormal   Collection Time: 03/04/17 12:02 AM  Result Value Ref Range   Glucose-Capillary 107 (H) 65 - 99 mg/dL  Urine rapid drug screen (hosp performed)     Status: Abnormal   Collection Time: 03/04/17  4:50 AM  Result Value Ref Range   Opiates NONE DETECTED NONE  DETECTED   Cocaine NONE DETECTED NONE DETECTED   Benzodiazepines POSITIVE (A) NONE DETECTED   Amphetamines NONE DETECTED NONE DETECTED   Tetrahydrocannabinol NONE DETECTED NONE DETECTED   Barbiturates NONE DETECTED NONE DETECTED    Comment: (NOTE) DRUG SCREEN FOR MEDICAL PURPOSES ONLY.  IF CONFIRMATION IS NEEDED FOR ANY PURPOSE, NOTIFY LAB WITHIN 5 DAYS. LOWEST DETECTABLE LIMITS FOR URINE DRUG SCREEN Drug Class                     Cutoff (ng/mL) Amphetamine and metabolites    1000 Barbiturate and metabolites    200 Benzodiazepine                 976 Tricyclics and metabolites     300 Opiates and metabolites        300 Cocaine and metabolites        300 THC                            50   Glucose, capillary     Status: None   Collection Time: 03/04/17  5:19 AM  Result Value Ref Range   Glucose-Capillary 98 65 - 99 mg/dL  CBC     Status: Abnormal   Collection Time: 03/04/17  5:22 AM  Result Value Ref Range  WBC 6.2 4.0 - 10.5 K/uL   RBC 4.63 4.22 - 5.81 MIL/uL   Hemoglobin 12.4 (L) 13.0 - 17.0 g/dL   HCT 40.3 39.0 - 52.0 %   MCV 87.0 78.0 - 100.0 fL   MCH 26.8 26.0 - 34.0 pg   MCHC 30.8 30.0 - 36.0 g/dL   RDW 20.9 (H) 11.5 - 15.5 %   Platelets 218 150 - 400 K/uL  Basic metabolic panel     Status: Abnormal   Collection Time: 03/04/17  5:22 AM  Result Value Ref Range   Sodium 138 135 - 145 mmol/L   Potassium 4.2 3.5 - 5.1 mmol/L   Chloride 104 101 - 111 mmol/L   CO2 28 22 - 32 mmol/L   Glucose, Bld 92 65 - 99 mg/dL   BUN 13 6 - 20 mg/dL   Creatinine, Ser 1.02 0.61 - 1.24 mg/dL   Calcium 7.8 (L) 8.9 - 10.3 mg/dL   GFR calc non Af Amer >60 >60 mL/min   GFR calc Af Amer >60 >60 mL/min    Comment: (NOTE) The eGFR has been calculated using the CKD EPI equation. This calculation has not been validated in all clinical situations. eGFR's persistently <60 mL/min signify possible Chronic Kidney Disease.    Anion gap 6 5 - 15  CK     Status: Abnormal   Collection Time:  03/04/17  5:22 AM  Result Value Ref Range   Total CK 998 (H) 49 - 397 U/L  Glucose, capillary     Status: None   Collection Time: 03/04/17  7:23 AM  Result Value Ref Range   Glucose-Capillary 92 65 - 99 mg/dL  Valproic acid level     Status: Abnormal   Collection Time: 03/04/17  9:53 AM  Result Value Ref Range   Valproic Acid Lvl 21 (L) 50.0 - 100.0 ug/mL  Blood gas, venous     Status: Abnormal   Collection Time: 03/04/17 10:05 AM  Result Value Ref Range   pH, Ven 7.393 7.250 - 7.430   pCO2, Ven 50.8 44.0 - 60.0 mmHg   pO2, Ven 52.5 (H) 32.0 - 45.0 mmHg   Bicarbonate 30.3 (H) 20.0 - 28.0 mmol/L   Acid-Base Excess 4.8 (H) 0.0 - 2.0 mmol/L   O2 Saturation 84.5 %   Patient temperature 98.6    Collection site VEIN    Drawn by COLLECTED BY LABORATORY    Sample type VENOUS   Glucose, capillary     Status: Abnormal   Collection Time: 03/04/17 11:29 AM  Result Value Ref Range   Glucose-Capillary 139 (H) 65 - 99 mg/dL  Strep pneumoniae urinary antigen     Status: None   Collection Time: 03/04/17  5:04 PM  Result Value Ref Range   Strep Pneumo Urinary Antigen NEGATIVE NEGATIVE    Comment:        Infection due to S. pneumoniae cannot be absolutely ruled out since the antigen present may be below the detection limit of the test. Performed at [facility], [address], [place], [place] [address]   Glucose, capillary     Status: Abnormal   Collection Time: 03/04/17  8:42 PM  Result Value Ref Range   Glucose-Capillary 143 (H) 65 - 99 mg/dL  CK     Status: None   Collection Time: 03/05/17  6:33 AM  Result Value Ref Range   Total CK 349 49 - 397 U/L  CBC     Status: Abnormal  Collection Time: 03/05/17  6:33 AM  Result Value Ref Range   WBC 7.6 4.0 - 10.5 K/uL   RBC 4.75 4.22 - 5.81 MIL/uL   Hemoglobin 12.8 (L) 13.0 - 17.0 g/dL   HCT 42.1 39.0 - 52.0 %   MCV 88.6 78.0 - 100.0 fL   MCH 26.9 26.0 - 34.0 pg   MCHC 30.4 30.0 - 36.0 g/dL   RDW 21.3 (H) 11.5 - 15.5 %    Platelets 206 150 - 400 K/uL  Comprehensive metabolic panel     Status: Abnormal   Collection Time: 03/05/17  6:33 AM  Result Value Ref Range   Sodium 138 135 - 145 mmol/L   Potassium 4.0 3.5 - 5.1 mmol/L   Chloride 103 101 - 111 mmol/L   CO2 29 22 - 32 mmol/L   Glucose, Bld 112 (H) 65 - 99 mg/dL   BUN 12 6 - 20 mg/dL   Creatinine, Ser 1.24 0.61 - 1.24 mg/dL   Calcium 7.8 (L) 8.9 - 10.3 mg/dL   Total Protein 5.5 (L) 6.5 - 8.1 g/dL   Albumin 2.6 (L) 3.5 - 5.0 g/dL   AST 26 15 - 41 U/L   ALT 21 17 - 63 U/L   Alkaline Phosphatase 62 38 - 126 U/L   Total Bilirubin 0.8 0.3 - 1.2 mg/dL   GFR calc non Af Amer 58 (L) >60 mL/min   GFR calc Af Amer >60 >60 mL/min    Comment: (NOTE) The eGFR has been calculated using the CKD EPI equation. This calculation has not been validated in all clinical situations. eGFR's persistently <60 mL/min signify possible Chronic Kidney Disease.    Anion gap 6 5 - 15  Procalcitonin - Baseline     Status: None   Collection Time: 03/05/17  6:33 AM  Result Value Ref Range   Procalcitonin 0.17 ng/mL    Comment:        Interpretation: PCT (Procalcitonin) <= 0.5 ng/mL: Systemic infection (sepsis) is not likely. Local bacterial infection is possible. (NOTE)       Sepsis PCT Algorithm           Lower Respiratory Tract                                      Infection PCT Algorithm    ----------------------------     ----------------------------         PCT < 0.25 ng/mL                PCT < 0.10 ng/mL         Strongly encourage             Strongly discourage   discontinuation of antibiotics    initiation of antibiotics    ----------------------------     -----------------------------       PCT 0.25 - 0.50 ng/mL            PCT 0.10 - 0.25 ng/mL               OR       >80% decrease in PCT            Discourage initiation of  antibiotics      Encourage discontinuation           of antibiotics     ----------------------------     -----------------------------         PCT >= 0.50 ng/mL              PCT 0.26 - 0.50 ng/mL               AND        <80% decrease in PCT             Encourage initiation of                                             antibiotics       Encourage continuation           of antibiotics    ----------------------------     -----------------------------        PCT >= 0.50 ng/mL                  PCT > 0.50 ng/mL               AND         increase in PCT                  Strongly encourage                                      initiation of antibiotics    Strongly encourage escalation           of antibiotics                                     -----------------------------                                           PCT <= 0.25 ng/mL                                                 OR                                        > 80% decrease in PCT                                     Discontinue / Do not initiate                                             antibiotics   Glucose, capillary     Status: Abnormal   Collection Time: 03/05/17  7:30 AM  Result Value Ref Range   Glucose-Capillary 141 (H)  65 - 99 mg/dL    Current Facility-Administered Medications  Medication Dose Route Frequency Provider Last Rate Last Dose  . ALPRAZolam Duanne Moron) tablet 1 mg  1 mg Oral TID PRN Fuller Plan A, MD      . amiodarone (PACERONE) tablet 200 mg  200 mg Oral Daily Tamala Julian, Rondell A, MD   200 mg at 03/05/17 0842  . arformoterol (BROVANA) nebulizer solution 15 mcg  15 mcg Nebulization BID Fuller Plan A, MD   15 mcg at 03/05/17 0803  . aspirin EC tablet 81 mg  81 mg Oral Daily Fuller Plan A, MD   81 mg at 03/05/17 0842  . atorvastatin (LIPITOR) tablet 40 mg  40 mg Oral Daily Fuller Plan A, MD   40 mg at 03/05/17 0842  . budesonide (PULMICORT) nebulizer solution 0.5 mg  0.5 mg Nebulization BID Fuller Plan A, MD   0.5 mg at 03/05/17 0803  . diphenhydrAMINE (BENADRYL)  injection 25 mg  25 mg Intravenous Q6H PRN Lovey Newcomer T, NP   25 mg at 03/05/17 0354  . divalproex (DEPAKOTE ER) 24 hr tablet 750 mg  750 mg Oral Daily Elodia Florence., MD   750 mg at 03/05/17 0842  . enoxaparin (LOVENOX) injection 40 mg  40 mg Subcutaneous QHS Smith, Rondell A, MD   40 mg at 03/04/17 2115  . guaiFENesin (MUCINEX) 12 hr tablet 600 mg  600 mg Oral BID Fuller Plan A, MD   600 mg at 03/05/17 0842  . ipratropium-albuterol (DUONEB) 0.5-2.5 (3) MG/3ML nebulizer solution 3 mL  3 mL Nebulization Once Little, Wenda Overland, MD      . ipratropium-albuterol (DUONEB) 0.5-2.5 (3) MG/3ML nebulizer solution 3 mL  3 mL Nebulization Q4H PRN Smith, Rondell A, MD      . ipratropium-albuterol (DUONEB) 0.5-2.5 (3) MG/3ML nebulizer solution 3 mL  3 mL Inhalation TID Fuller Plan A, MD   3 mL at 03/05/17 0803  . levETIRAcetam (KEPPRA) tablet 500 mg  500 mg Oral BID Fuller Plan A, MD   500 mg at 03/05/17 0842  . ondansetron (ZOFRAN) tablet 4 mg  4 mg Oral Q6H PRN Fuller Plan A, MD       Or  . ondansetron (ZOFRAN) injection 4 mg  4 mg Intravenous Q6H PRN Smith, Rondell A, MD      . pantoprazole (PROTONIX) EC tablet 40 mg  40 mg Oral Daily Tamala Julian, Rondell A, MD   40 mg at 03/05/17 0842  . potassium chloride SA (K-DUR,KLOR-CON) CR tablet 20 mEq  20 mEq Oral Daily Fuller Plan A, MD   20 mEq at 03/05/17 0842  . traMADol (ULTRAM) tablet 50 mg  50 mg Oral Q6H PRN Fuller Plan A, MD   50 mg at 03/05/17 1310    Musculoskeletal: Strength & Muscle Tone: within normal limits Gait & Station: unable to stand Patient leans: N/A  Psychiatric Specialty Exam: Physical Exam  Psychiatric: Judgment and thought content normal. His affect is angry and labile. His speech is rapid and/or pressured. He is agitated, aggressive and combative. Cognition and memory are normal.    ROS  Blood pressure (!) 107/56, pulse 77, temperature 100.3 F (37.9 C), temperature source Oral, resp. rate 18, height 5'  3" (1.6 m), weight 102.8 kg (226 lb 10.1 oz), SpO2 92 %.Body mass index is 40.15 kg/m.  General Appearance: Casual  Eye Contact:  Good  Speech:  Clear and Coherent  Volume:  Increased  Mood:  Irritable  Affect:  Labile  Thought Process:  Coherent  Orientation:  Full (Time, Place, and Person)  Thought Content:  Illogical  Suicidal Thoughts:  No  Homicidal Thoughts:  No  Memory:  Immediate;   Fair Recent;   Fair Remote;   Fair  Judgement:  Poor  Insight:  Shallow  Psychomotor Activity:  Increased  Concentration:  Concentration: Fair and Attention Span: Fair  Recall:  [employer] of Knowledge:  Fair  Language:  Good  Akathisia:  No  Handed:  Right  AIMS (if indicated):     Assets:  Communication Skills  ADL's:  marginal  Cognition:  WNL  Sleep:   fair     Treatment Plan Summary: Patient with history of mental illness and multiple medical problem who is refusing medical intervention and treatment. Patient does not have capacity to make medical decision today due to his inability to realize or understand the consequences of refusing treatment.   Corena Pilgrim, MD 03/05/2017 1:34 PM

## 2017-03-05 NOTE — Plan of Care (Signed)
  Progressing Education: Knowledge of General Education information will improve 03/05/2017 1024 - Progressing by Kerrin Mo, RN 03/05/2017 [phone] - Progressing by Kerrin Mo, RN Health Behavior/Discharge Planning: Ability to manage health-related needs will improve 03/05/2017 1024 - Progressing by Kerrin Mo, RN 03/05/2017 0955 - Progressing by Kerrin Mo, RN Clinical Measurements: Ability to maintain clinical measurements within normal limits will improve 03/05/2017 1024 - Progressing by Kerrin Mo, RN 03/05/2017 [phone] - Progressing by Kerrin Mo, RN Will remain free from infection 03/05/2017 1024 - Progressing by Kerrin Mo, RN Note On IV abx.  03/05/2017 0955 - Progressing by Kerrin Mo, RN Diagnostic test results will improve 03/05/2017 1024 - Progressing by Kerrin Mo, RN Respiratory complications will improve 03/05/2017 1024 - Progressing by Kerrin Mo, RN Note Lungs remain coarse w/ congested, NP cough. Remains dependent upon 3lnc.  Cardiovascular complication will be avoided 03/05/2017 1024 - Progressing by Kerrin Mo, RN Activity: Risk for activity intolerance will decrease 03/05/2017 1024 - Progressing by Kerrin Mo, RN Coping: Level of anxiety will decrease 03/05/2017 1024 - Progressing by Kerrin Mo, RN Elimination: Will not experience complications related to bowel motility 03/05/2017 1024 - Progressing by Kerrin Mo, RN Pain Managment: General experience of comfort will improve 03/05/2017 1024 - Progressing by Kerrin Mo, RN Safety: Ability to remain free from injury will improve 03/05/2017 1024 - Progressing by Kerrin Mo, RN Skin Integrity: Risk for impaired skin integrity will decrease 03/05/2017 1024 - Progressing by Kerrin Mo, RN Activity: Ability to tolerate increased activity will improve 03/05/2017 1024 - Progressing  by Kerrin Mo, RN Clinical Measurements: Ability to maintain a body temperature in the normal range will improve 03/05/2017 1024 - Progressing by Kerrin Mo, RN Respiratory: Ability to maintain adequate ventilation will improve 03/05/2017 1024 - Progressing by Kerrin Mo, RN Ability to maintain a clear airway will improve 03/05/2017 1024 - Progressing by Kerrin Mo, RN

## 2017-03-05 NOTE — Progress Notes (Addendum)
PROGRESS NOTE    Patrick Holt  ONG:[id number] DOB: 1950/09/03 DOA: 03/03/2017 PCP: Medicine, [facility]   Brief Narrative:  Patrick Holt is a 67 y.o. male with medical history significant of COPD, HTN, A. fib not on anticoagulation, bipolar disorder, seizure disorder, dermatomyositis, CVA; who presents with complaints of weakness after having a fall.  Patient was just recently hospitalized from 1/6 -1/9 after being found with acute respiratory failure with hypoxia and hypercapnia requiring BiPAP.   Being treated for HCAP and possible volume overload.  Assessment & Plan:   Active Problems:   COPD (chronic obstructive pulmonary disease) (HCC)   Atrial fibrillation (HCC)   Hypoxia   Bipolar 1 disorder (HCC)   Seizure disorder (HCC)   Acute respiratory failure with hypoxia (HCC)   Acute metabolic encephalopathy   Hypoglycemia   Acute respiratory failure with hypoxia, history of COPD:  Patient noted to have O2 saturations in the 80s on room air upon EMS arrival.  At baseline patient not on oxygen.  Venous blood gas showed no signs of hypercarbia.  Requiring 3 L now to maintain O2 sats.  Initially thought infectious, but normal WBC and procalcitonin not elevated.  Holding abx for now.  He's had low grade temperatures, no true temperatures.  Question aspiration with CTA findings.  Question contribution of his dermatomyositis? Pulmonary involvement?   CXR on presentation with "streaky bibasilar atelectasis vs mild infiltrates".   Repeat CXR 1/18 with low lung volumes but persistent bibasilar atelectasis/infiltrates "findings have progressed from prior exam".  CTA given LLE swelling on 1/19 notable for no PE.  New patchy ground lass opacities in RUL.  Persistent dependent airspace opacities at both lung bases with associated volume loss.  Consider atelectasis vs recurrent aspiration. [ ]  Speech evaluation - Vanc/cefepime -> will stop for now given rash below - follow  procalcitonin [ ]  blood cultures NGTD [ ]  sputum cx pending collection  [ ]  urine strep and urine legionella negative [ ]  RVP - consider adding steroids - Brovana and budesonide nebs - DuoNeb's  Diastolic CHF: Last EF noted to be 65-70% with grade 1 diastolic dysfunction on 44/02/270.  Chest x-ray initially with cardiomegaly and vascular congestion, but that has improved today.  BNP noted to be slightly higher than previous at 77.2.   - Strict I&Os and daily weights - Will hold off additional fluids  - Lasix IV 20 mg BID   Rash: possibly related to dermatomyositis, but seems worse today.  He's been on vanc/cefepime, which at this point are the only new meds.  Diffuse maculopapular rash.  At this point, will give dose of benadryl and hold antibiotics at this time (will add to allergy list, though unclear cause as he was receiving multiple meds).   Acute metabolic encephalopathy:  Acute.  Suspect 2/2 above.  He's been agitated, asking to go home.  He's able to say why he came in (relates fall), but doesn't acknowledge his O2 requirement. Patient was initially noted to be hypoxic and initial CBG noted glucose of 71.  Ammonia normal.  Utox with benzos (he takes at home).  Suspect likely multifactorial, possibly driven by possible infection above. - repeat VBG - Neurochecks - check UDS - benzos (low threshold to hold his home xanax if this seems to make things worse) - check valproic acid level (low) - Monitor for seizure activity  - Check ammonia level - wnl - treat above - will continue to monitor [ ]  psychiatry for  capacity as he keeps asking to leave  Left lower extremity edema: lower extremity US and CTA given hypoxia as above.   Hypoglycemia: Patient's initial CBG was noted to be 71.  Patient was given 25 mL of 50% dextrose with blood sugar.  Improved. - continue to monitor  Elevated CK: improved - Recheck CPK improved - follow tomorrow  History of dermatomyositis: Patient  currently not on any treatment for this condition and is monitored by rheumatology. - Continue outpatient follow-up with rheumatology  History of seizure disorder  - seizure precautions - Continue Keppra  Paroxysmal atrial fibrillation: Patient appears to be in sinus rhythm at this time.  Not on anticoagulation due to recurrent falls and seizure history. - Continue amiodarone  Essential hypertension - Hold amlodipine due to soft blood pressures  History of bipolar disorder: Stable - continue Depakote  Hyperlipidemia - Continue atorvastatin  DVT prophylaxis: lovenox Code Status: full  Family Communication: none at bedside Disposition Plan: pending   Consultants:   none  Procedures:   none  Antimicrobials: Anti-infectives (From admission, onward)   Start     Dose/Rate Route Frequency Ordered Stop   03/05/17 0400  vancomycin (VANCOCIN) IVPB 750 mg/150 ml premix  Status:  Discontinued     750 mg 150 mL/hr over 60 Minutes Intravenous Every 12 hours 03/04/17 1508 03/05/17 1158   03/04/17 1600  vancomycin (VANCOCIN) 1,500 mg in sodium chloride 0.9 % 500 mL IVPB     1,500 mg 250 mL/hr over 120 Minutes Intravenous  Once 03/04/17 1508 03/04/17 1813   03/04/17 1500  ceFEPIme (MAXIPIME) 1 g in dextrose 5 % 50 mL IVPB  Status:  Discontinued     1 g 100 mL/hr over 30 Minutes Intravenous Every 8 hours 03/04/17 1445 03/05/17 1158   03/03/17 2000  ceFEPIme (MAXIPIME) 2 g in dextrose 5 % 50 mL IVPB     2 g 100 mL/hr over 30 Minutes Intravenous  Once 03/03/17 1946 03/03/17 2202   03/03/17 2000  vancomycin (VANCOCIN) IVPB 1000 mg/200 mL premix     1,000 mg 200 mL/hr over 60 Minutes Intravenous  Once 03/03/17 1950 03/03/17 2320     Subjective: Wants to go home.   Notes he has bills to pay.  Doesn't acknowledge difficulty breathing.  Objective: Vitals:   03/05/17 0401 03/05/17 0428 03/05/17 0805 03/05/17 1032  BP:  (!) 107/56    Pulse:  77  77  Resp: 18 18    Temp:   99.4 F (37.4 C)  100.3 F (37.9 C)  TempSrc:  Oral  Oral  SpO2:  97% 93% 92%  Weight: 102.8 kg (226 lb 10.1 oz)     Height:        Intake/Output Summary (Last 24 hours) at 03/05/2017 1159 Last data filed at 03/05/2017 1000 Gross per 24 hour  Intake 1470 ml  Output 475 ml  Net 995 ml   Filed Weights   03/03/17 2240 03/04/17 0520 03/05/17 0401  Weight: 102.2 kg (225 lb 5 oz) 102.2 kg (225 lb 5 oz) 102.8 kg (226 lb 10.1 oz)    Examination:  General: No acute distress.  Upset, doesn't think he needs to be here. Cardiovascular: Heart sounds show a regular rate, and rhythm. No gallops or rubs. No murmurs. No JVD. Lungs: Clear to auscultation bilaterally with good air movement. No rales, rhonchi or wheezes. Abdomen: Soft, nontender, nondistended with normal active bowel sounds. No masses. No hepatosplenomegaly. Neurological: Alert and oriented 3. Moves all extremities  4. Cranial nerves II through XII grossly intact. Skin: diffuse maculopapular rash Extremities: No clubbing or cyanosis. LLE edema and tenderness to calf.    Data Reviewed: I have personally reviewed following labs and imaging studies  CBC: Recent Labs  Lab 03/03/17 1709 03/03/17 1727 03/04/17 0522 03/05/17 0633  WBC 8.3  --  6.2 7.6  NEUTROABS 5.3  --   --   --   HGB 13.1 13.9 12.4* 12.8*  HCT 41.7 41.0 40.3 42.1  MCV 86.5  --  87.0 88.6  PLT 239  --  218 233   Basic Metabolic Panel: Recent Labs  Lab 03/03/17 1709 03/03/17 1727 03/04/17 0522 03/05/17 0633  NA 140 142 138 138  K 3.9 4.0 4.2 4.0  CL 104 102 104 103  CO2 29  --  28 29  GLUCOSE 75 71 92 112*  BUN 15 14 13 12   CREATININE 1.07 1.10 1.02 1.24  CALCIUM 8.3*  --  7.8* 7.8*   GFR: Estimated Creatinine Clearance: 60.7 mL/min (by C-G formula based on SCr of 1.24 mg/dL). Liver Function Tests: Recent Labs  Lab 03/03/17 1709 03/05/17 0633  AST 40 26  ALT 25 21  ALKPHOS 68 62  BILITOT 1.1 0.8  PROT 6.0* 5.5*  ALBUMIN 3.1* 2.6*    No results for input(s): LIPASE, AMYLASE in the last 168 hours. Recent Labs  Lab 03/03/17 2225  AMMONIA 34   Coagulation Profile: No results for input(s): INR, PROTIME in the last 168 hours. Cardiac Enzymes: Recent Labs  Lab 03/03/17 1709 03/03/17 1745 03/04/17 0522 03/05/17 0633  CKTOTAL 1,612*  --  998* 349  TROPONINI  --  <0.03  --   --    BNP (last 3 results) No results for input(s): PROBNP in the last 8760 hours. HbA1C: No results for input(s): HGBA1C in the last 72 hours. CBG: Recent Labs  Lab 03/04/17 0519 03/04/17 0723 03/04/17 1129 03/04/17 2042 03/05/17 0730  GLUCAP 98 92 139* 143* 141*   Lipid Profile: No results for input(s): CHOL, HDL, LDLCALC, TRIG, CHOLHDL, LDLDIRECT in the last 72 hours. Thyroid Function Tests: No results for input(s): TSH, T4TOTAL, FREET4, T3FREE, THYROIDAB in the last 72 hours. Anemia Panel: No results for input(s): VITAMINB12, FOLATE, FERRITIN, TIBC, IRON, RETICCTPCT in the last 72 hours. Sepsis Labs: Recent Labs  Lab 03/03/17 1727  LATICACIDVEN 0.89    Recent Results (from the past 240 hour(s))  Culture, blood (routine x 2)     Status: None (Preliminary result)   Collection Time: 03/03/17  5:15 PM  Result Value Ref Range Status   Specimen Description BLOOD LEFT ANTECUBITAL  Final   Special Requests   Final    BOTTLES DRAWN AEROBIC AND ANAEROBIC Blood Culture adequate volume   Culture   Final    NO GROWTH < 12 HOURS Performed at [facility], [place] [address] [place], [place] [address]    Report Status PENDING  Incomplete  Culture, blood (routine x 2)     Status: None (Preliminary result)   Collection Time: 03/03/17  5:40 PM  Result Value Ref Range Status   Specimen Description BLOOD LEFT HAND  Final   Special Requests IN PEDIATRIC BOTTLE Blood Culture adequate volume  Final   Culture   Final    NO GROWTH < 12 HOURS Performed at [facility], [place] [address], [place], [place] [address]    Report Status  PENDING  Incomplete  Urine culture     Status:  Abnormal   Collection Time: 03/03/17  6:39 PM  Result Value Ref Range Status   Specimen Description URINE, CLEAN CATCH  Final   Special Requests Normal  Final   Culture MULTIPLE SPECIES PRESENT, SUGGEST RECOLLECTION (A)  Final   Report Status 03/05/2017 FINAL  Final         Radiology Studies: Dg Chest 2 View  Result Date: 03/04/2017 CLINICAL DATA:  Shortness of breath. EXAM: CHEST  2 VIEW COMPARISON:  03/03/2017. FINDINGS: Cardiomegaly with normal pulmonary vascularity. No pulmonary venous congestion noted on today's exam. Low lung volumes with mild bibasilar atelectasis/infiltrates, progressed from prior exam. No pleural effusion or pneumothorax. Degenerative changes thoracic spine. IMPRESSION: Low lung volumes with bibasilar atelectasis/infiltrates. Findings have progressed from prior exam. 2. Stable cardiomegaly. No pulmonary venous congestion noted on today's exam. Electronically Signed   By: Marcello Moores  Register   On: 03/04/2017 10:54   Dg Chest 2 View  Result Date: 03/03/2017 CLINICAL DATA:  Generalize weakness EXAM: CHEST  2 VIEW COMPARISON:  Jun 28, 202019 FINDINGS: Cardiomegaly with vascular congestion. Streaky bibasilar atelectasis or infiltrates. Aortic atherosclerosis. No pneumothorax. Degenerative changes of the spine. IMPRESSION: 1. Cardiomegaly with vascular congestion 2. Streaky bibasilar atelectasis versus mild infiltrates. Electronically Signed   By: Donavan Foil M.D.   On: 03/03/2017 16:47   Ct Head Wo Contrast  Result Date: 03/03/2017 CLINICAL DATA:  Altered level of consciousness EXAM: CT HEAD WITHOUT CONTRAST TECHNIQUE: Contiguous axial images were obtained from the base of the skull through the vertex without intravenous contrast. COMPARISON:  CT brain Jun 28, 202019, 02/05/2017 FINDINGS: Brain: No acute territorial infarction, hemorrhage or intracranial mass is visualized. Old lacunar infarct in the central pons. Moderate vessel  ischemic changes of the white matter. Mild to moderate atrophy. Stable ventricle size Vascular: No hyperdense vessels.  Carotid artery calcification Skull: Normal. Negative for fracture or focal lesion. Sinuses/Orbits: Mild mucosal thickening in the ethmoid sinuses. No acute orbital abnormality. Other: None. IMPRESSION: 1. No CT evidence for acute intracranial abnormality. 2. Atrophy and small vessel ischemic changes of the white matter. Electronically Signed   By: Donavan Foil M.D.   On: 03/03/2017 19:45        Scheduled Meds: . amiodarone  200 mg Oral Daily  . arformoterol  15 mcg Nebulization BID  . aspirin EC  81 mg Oral Daily  . atorvastatin  40 mg Oral Daily  . budesonide (PULMICORT) nebulizer solution  0.5 mg Nebulization BID  . diphenhydrAMINE  25 mg Oral Once  . divalproex  750 mg Oral Daily  . enoxaparin (LOVENOX) injection  40 mg Subcutaneous QHS  . guaiFENesin  600 mg Oral BID  . ipratropium-albuterol  3 mL Nebulization Once  . ipratropium-albuterol  3 mL Inhalation TID  . levETIRAcetam  500 mg Oral BID  . pantoprazole  40 mg Oral Daily  . potassium chloride SA  20 mEq Oral Daily   Continuous Infusions:   LOS: 1 day    Time spent: over 30 minutes    Fayrene Helper, MD [facility] Pager [phone]  If 7PM-7AM, please contact night-coverage [email or web address] Password [email or web address] 03/05/2017, 11:59 AM

## 2017-03-06 ENCOUNTER — Inpatient Hospital Stay (HOSPITAL_COMMUNITY): Payer: Medicare Other

## 2017-03-06 DIAGNOSIS — M7989 Other specified soft tissue disorders: Secondary | ICD-10-CM

## 2017-03-06 LAB — RESPIRATORY PANEL BY PCR
Adenovirus: NOT DETECTED
BORDETELLA PERTUSSIS-RVPCR: NOT DETECTED
Chlamydophila pneumoniae: NOT DETECTED
Coronavirus 229E: NOT DETECTED
Coronavirus HKU1: NOT DETECTED
Coronavirus NL63: NOT DETECTED
Coronavirus OC43: NOT DETECTED
INFLUENZA A-RVPPCR: NOT DETECTED
INFLUENZA B-RVPPCR: NOT DETECTED
MYCOPLASMA PNEUMONIAE-RVPPCR: NOT DETECTED
Metapneumovirus: NOT DETECTED
PARAINFLUENZA VIRUS 4-RVPPCR: NOT DETECTED
Parainfluenza Virus 1: NOT DETECTED
Parainfluenza Virus 2: NOT DETECTED
Parainfluenza Virus 3: NOT DETECTED
RESPIRATORY SYNCYTIAL VIRUS-RVPPCR: NOT DETECTED
Rhinovirus / Enterovirus: NOT DETECTED

## 2017-03-06 LAB — CBC
HCT: 43.1 % (ref 39.0–52.0)
Hemoglobin: 13 g/dL (ref 13.0–17.0)
MCH: 26.9 pg (ref 26.0–34.0)
MCHC: 30.2 g/dL (ref 30.0–36.0)
MCV: 89 fL (ref 78.0–100.0)
PLATELETS: 208 10*3/uL (ref 150–400)
RBC: 4.84 MIL/uL (ref 4.22–5.81)
RDW: 21.3 % — ABNORMAL HIGH (ref 11.5–15.5)
WBC: 7.9 10*3/uL (ref 4.0–10.5)

## 2017-03-06 LAB — BASIC METABOLIC PANEL
ANION GAP: 6 (ref 5–15)
BUN: 10 mg/dL (ref 6–20)
CO2: 30 mmol/L (ref 22–32)
Calcium: 8 mg/dL — ABNORMAL LOW (ref 8.9–10.3)
Chloride: 103 mmol/L (ref 101–111)
Creatinine, Ser: 0.99 mg/dL (ref 0.61–1.24)
Glucose, Bld: 89 mg/dL (ref 65–99)
POTASSIUM: 3.8 mmol/L (ref 3.5–5.1)
SODIUM: 139 mmol/L (ref 135–145)

## 2017-03-06 LAB — PROCALCITONIN

## 2017-03-06 LAB — TSH: TSH: 0.985 u[IU]/mL (ref 0.350–4.500)

## 2017-03-06 MED ORDER — PREDNISONE 20 MG PO TABS
40.0000 mg | ORAL_TABLET | Freq: Every day | ORAL | Status: DC
  Administered 2017-03-06 – 2017-03-07 (×2): 40 mg via ORAL
  Filled 2017-03-06 (×2): qty 2

## 2017-03-06 MED ORDER — PREDNISONE 20 MG PO TABS: 40.0000 mg | ORAL_TABLET | Freq: Every day | ORAL | Status: DC

## 2017-03-06 NOTE — Progress Notes (Signed)
*  Preliminary Results* Left lower extremity venous duplex completed. Left lower extremity is negative for deep vein thrombosis. There is no evidence of left Baker's cyst.  03/06/2017 12:16 PM  Maudry Mayhew, BS, RVT, RDCS, RDMS

## 2017-03-06 NOTE — Evaluation (Signed)
Clinical/Bedside Swallow Evaluation Patient Details  Name: Patrick Holt MRN: [id number] Date of Birth: 1950-05-02  Today's Date: 03/06/2017 Time: SLP Start Time (ACUTE ONLY): 6144 SLP Stop Time (ACUTE ONLY): 1705 SLP Time Calculation (min) (ACUTE ONLY): 21 min  Past Medical History:  Past Medical History:  Diagnosis Date  . Anemia   . Anxiety   . Asthma   . Atrial fibrillation ([place]) 01/2012   with RVR  . Bipolar 1 disorder ([place])   . Cancer ([place])   . Collagen vascular disease ([place])   . Conversion disorder 06/2010  . COPD (chronic obstructive pulmonary disease) ([place])   . Dermatomyositis ([place])   . Dermatomyositis ([place])   . Diverticulitis   . Diverticulosis of colon   . Dysrhythmia    "irregular" (11/15/2012)  . Esophageal dysmotility   . Esophageal stricture   . Fibromyalgia   . Gastritis   . GERD (gastroesophageal reflux disease)   . Hand fracture, right    sept 13, 2017, still wearing splint as og 01-10-2017  . Headache(784.0)    "severe; get them often" (11/15/2012)  . Hiatal hernia   . History of narcotic addiction ([place])   . Hx of adenomatous colonic polyps   . Hyperlipidemia   . Hypertension   . Internal hemorrhoids   . Ischemic heart disease   . Major depression    with acute psychotic break in 06/2010  . Myocardial infarct ([place])    mulitple (1999, 2000, 2004)  . Narcotic dependence ([place])   . Nephrolithiasis   . Obesity   . OCD (obsessive compulsive disorder)   . Otosclerosis   . Paroxysmal A-fib ([place])   . Peripheral neuropathy   . Raynaud's disease   . Renal insufficiency   . Rheumatoid arthritis(714.0)   . Sarcoidosis   . Seizures ([place])   . Subarachnoid hemorrhage ([place]) 01/2012   with subdural  hematoma.   . Type II diabetes mellitus ([place])   . Urge incontinence   . Vertigo    Past Surgical History:  Past Surgical History:  Procedure Laterality Date  . BACK SURGERY    . CARDIAC CATHETERIZATION    . CARPAL TUNNEL RELEASE Bilateral   . CATARACT  EXTRACTION W/ INTRAOCULAR LENS IMPLANT Left   . COLONOSCOPY N/A 09/27/2012   Procedure: COLONOSCOPY;  Surgeon: Patrick Dragon, MD;  Location: [facility];  Service: Endoscopy;  Laterality: N/A;  . ESOPHAGOGASTRODUODENOSCOPY N/A 09/27/2012   Procedure: ESOPHAGOGASTRODUODENOSCOPY (EGD);  Surgeon: Patrick Dragon, MD;  Location: [facility];  Service: Endoscopy;  Laterality: N/A;  . KNEE ARTHROSCOPY W/ MENISCAL REPAIR Left 2009  . LUMBAR DISC SURGERY    . LYMPH NODE DISSECTION Right 1970's   "neck; dr thought I had Hodgkins; turned out to be sarcoidosis" (11/15/2012)  . squamous papilloma   2010   removed by Dr. Constance Holt ENT, noted on tongue  . TONSILLECTOMY     HPI:  Patrick Stupka Overcashis a 67 y.o.malewith medical history significant ofCOPD, HTN, A. fib not on anticoagulation, bipolar disorder, seizure disorder, dermatomyositis, CVA; whopresents withcomplaints of weakness after having a fall. Patient was just recently hospitalized from 1/6 -1/9 after being found with acute respiratory failure with hypoxia and hypercapnia requiring BiPAP. Being treated for HCAP and possible volume overload. Known to SLP from prior admissions, most recently 02/07/17. Soft diet with thin liquids was recommended with no further f/u, with esophageal precautions d/t GERD, hiatal hernia, esophageal dysmotility. 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 Esophagram 2000,  esophageal dilation.   Assessment / Plan / Recommendation Clinical Impression   Patient presents with oropharyngeal swallow which appears at bedside to be within functional limits with adequate airway protection. No overt signs of aspiration observed despite challenging with consecutive straw sips of thin liquids in excess of 3oz. Moderately prolonged but functional mastication of solids. Pt is edentulous and prefers a soft diet. Pt has history of GERD, hiatal hernia, esophageal dysmotility, therefore he remains at slightly increased  risk for aspiration. Education provided to pt re: esophageal precautions. Recommend dysphagia 3 with thin liquids, meds whole with liquid, esophageal precautions. No further skilled ST needs identified. Will s/o.   SLP Visit Diagnosis: Dysphagia, unspecified (R13.10)    Aspiration Risk  Mild aspiration risk    Diet Recommendation Dysphagia 3 (Mech soft);Thin liquid   Liquid Administration via: Cup;Straw Medication Administration: Whole meds with liquid Supervision: Patient able to self feed Compensations: Minimize environmental distractions;Slow rate;Small sips/bites Postural Changes: Seated upright at 90 degrees;Remain upright for at least 30 minutes after po intake    Other  Recommendations Oral Care Recommendations: Oral care BID   Follow up Recommendations None      Frequency and Duration            Prognosis        Swallow Study   General Date of Onset: 03/03/17 HPI: Patrick Helzer Overcashis a 67 y.o.malewith medical history significant ofCOPD, HTN, A. fib not on anticoagulation, bipolar disorder, seizure disorder, dermatomyositis, CVA; whopresents withcomplaints of weakness after having a fall. Patient was just recently hospitalized from 1/6 -1/9 after being found with acute respiratory failure with hypoxia and hypercapnia requiring BiPAP. Being treated for HCAP and possible volume overload. Known to SLP from prior admissions, most recently 02/07/17. Soft diet with thin liquids was recommended with no further f/u, with esophageal precautions d/t GERD, hiatal hernia, esophageal dysmotility. 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 Esophagram 2000, esophageal dilation. Type of Study: Bedside Swallow Evaluation Previous Swallow Assessment: see HPI Diet Prior to this Study: Regular;Thin liquids Temperature Spikes Noted: No Respiratory Status: Nasal cannula History of Recent Intubation: No Behavior/Cognition: Alert;Cooperative Oral Cavity Assessment:  Within Functional Limits Oral Care Completed by SLP: No Oral Cavity - Dentition: Edentulous Vision: Functional for self-feeding Self-Feeding Abilities: Able to feed self Patient Positioning: Upright in bed Baseline Vocal Quality: Normal Volitional Cough: Strong Volitional Swallow: Able to elicit    Oral/Motor/Sensory Function Overall Oral Motor/Sensory Function: Within functional limits   Ice Chips Ice chips: Not tested   Thin Liquid Thin Liquid: Within functional limits Presentation: Cup;Self Fed;Straw    Nectar Thick Nectar Thick Liquid: Not tested   Honey Thick Honey Thick Liquid: Not tested   Puree Puree: Within functional limits Presentation: Self Fed;Spoon   Solid   GO   Solid: Impaired Presentation: Self Fed Oral Phase Impairments: Impaired mastication Oral Phase Functional Implications: Impaired mastication       Deneise Lever, MS, CCC-SLP Speech-Language Pathologist [phone]  Aliene Altes 03/06/2017,5:08 PM

## 2017-03-06 NOTE — Progress Notes (Addendum)
PROGRESS NOTE    Patrick Holt  ZOX:[id number] DOB: 04/10/1950 DOA: 03/03/2017 PCP: Medicine, [facility]   Brief Narrative:  Patrick Holt is Patrick Holt 67 y.o. male with medical history significant of COPD, HTN, Patrick Holt. fib not on anticoagulation, bipolar disorder, seizure disorder, dermatomyositis, CVA; who presents with complaints of weakness after having Patrick Holt.  Patient was just recently hospitalized from 1/6 -1/9 after being found with acute respiratory failure with hypoxia and hypercapnia requiring BiPAP.   Being treated for HCAP and possible volume overload.  Assessment & Plan:   Principal Problem:   Acute respiratory failure with hypoxia (HCC) Active Problems:   COPD (chronic obstructive pulmonary disease) (HCC)   Atrial fibrillation (HCC)   Hypoxia   Bipolar 1 disorder (HCC)   Seizure disorder (HCC)   Acute metabolic encephalopathy   Hypoglycemia   Acute respiratory failure with hypoxia, history of COPD:  Patient noted to have O2 saturations in the 80s on room air upon EMS arrival.  At baseline patient not on oxygen.  Venous blood gas showed no signs of hypercarbia.  Still on 3 L now to maintain O2 sats.  Initially thought infectious, but normal WBC and procalcitonin not elevated.  Holding abx for now.  He's had low grade temperatures, no true temperatures.  Question aspiration with CTA findings?  Question contribution of his dermatomyositis? Pulmonary involvement? (discussed his CTA with pulmonology on call today who did not think findings c/w this).  At this point in time, diuresing as below.  Continue to hold abx without fever, WBC count and normal procalcitonin.  Will start steroids in case of contribution of COPD, though no wheezing on exam.   CXR on presentation with "streaky bibasilar atelectasis vs mild infiltrates".   Repeat CXR 1/18 with low lung volumes but persistent bibasilar atelectasis/infiltrates "findings have progressed from prior exam".  CTA given LLE  swelling on 1/19 notable for no PE.  New patchy ground lass opacities in RUL.  Persistent dependent airspace opacities at both lung bases with associated volume loss.  Consider atelectasis vs recurrent aspiration. [ ]  Speech evaluation pending - Vanc/cefepime -> will stop for now given rash below - follow procalcitonin [ ]  blood cultures NGTD [ ]  sputum cx pending collection  [ ]  urine strep and urine legionella negative [ ]  RVP pending - consider adding steroids - Brovana and budesonide nebs - DuoNeb's  Diastolic CHF: Last EF noted to be 65-70% with grade 1 diastolic dysfunction on 81/19/1478.  Chest x-ray initially with cardiomegaly and vascular congestion, but that has improved today.  BNP noted to be slightly higher than previous at 77.2.   - LEE is improving with diuresis, will continue - Strict I&Os and daily weights - Will hold off additional fluids  - Lasix IV 20 mg BID   Filed Weights   03/04/17 0520 03/05/17 0401 03/06/17 0440  Weight: 102.2 kg (225 lb 5 oz) 102.8 kg (226 lb 10.1 oz) 99.5 kg (219 lb 5.7 oz)   Rash: possibly related to dermatomyositis, he reports it's worsened since he's been here.  Appeared overnight on 1/18-1/19.  He'd been receiving vanc/cefepime.  These have been stopped and were added to allergy list, though it's unclear which caused this.  Acute metabolic encephalopathy:  On presentation it looks like he was somnolent, by the time our admitter saw him, he was oriented, but noted to have poor insight and judgement.  This has been the pattern since I've seen him.  His acute encephalopathy  with his somnolence/confusion (noted in EDP exam) has improved, but he's continued to have poor insight.   Patient was initially noted to be hypoxic and initial CBG noted glucose of 71.   Ammonia normal.  Utox with benzos (he takes at home).   HFP normal.  Head CT without acute intracranial abnormality.  Suspect likely multifactorial, possibly driven by hypoxia above.   -  repeat VBG without hypercarbia - Neurochecks [ ]  TSH - check valproic acid level (low) - Monitor for seizure activity  - treat above - will continue to monitor  Capacity: [name] was seen by psych yesterday and noted not to have capacity to make medical decisions at that time, his presentation today is c/w yesterday.  He continues to be focused on going home, denying that he needs to be here any longer.  He does not show me today that he has the capacity to decline treatment today or that he clearly understands the consequences of declining further treatment and workup.  I don't think he has the capacity to decline SNF with recent Holt, down for ~3 days (and recent admission after Holt with similar presentation) and not acknowledging that he may need help at home or that he's at risk for recurrent falls.  Will consider c/s psych for capacity to decline SNF.    Left lower extremity edema: lower extremity US without evidence of DVT and CTA (as above) given hypoxia as above.  Improving with diuresis.     Hypoglycemia: Patient's initial CBG was noted to be 71.  Patient was given 25 mL of 50% dextrose with blood sugar.  Improved. - continue to monitor  Elevated CK: improved  History of dermatomyositis: Patient currently not on any treatment for this condition and is monitored by rheumatology. - Continue outpatient follow-up with rheumatology  History of seizure disorder  - seizure precautions - Continue Keppra  Paroxysmal atrial fibrillation: Patient appears to be in sinus rhythm at this time.  Not on anticoagulation due to recurrent falls and seizure history. - Continue amiodarone  Essential hypertension - Hold amlodipine due to soft blood pressures  History of bipolar disorder: Stable - continue Depakote  Hyperlipidemia - Continue atorvastatin  DVT prophylaxis: lovenox Code Status: full  Family Communication: none at bedside Disposition Plan: pending   Consultants:    none  Procedures:   none  Antimicrobials: Anti-infectives (From admission, onward)   Start     Dose/Rate Route Frequency Ordered Stop   03/05/17 0400  vancomycin (VANCOCIN) IVPB 750 mg/150 ml premix  Status:  Discontinued     750 mg 150 mL/hr over 60 Minutes Intravenous Every 12 hours 03/04/17 1508 03/05/17 1158   03/04/17 1600  vancomycin (VANCOCIN) 1,500 mg in sodium chloride 0.9 % 500 mL IVPB     1,500 mg 250 mL/hr over 120 Minutes Intravenous  Once 03/04/17 1508 03/04/17 1813   03/04/17 1500  ceFEPIme (MAXIPIME) 1 g in dextrose 5 % 50 mL IVPB  Status:  Discontinued     1 g 100 mL/hr over 30 Minutes Intravenous Every 8 hours 03/04/17 1445 03/05/17 1158   03/03/17 2000  ceFEPIme (MAXIPIME) 2 g in dextrose 5 % 50 mL IVPB     2 g 100 mL/hr over 30 Minutes Intravenous  Once 03/03/17 1946 03/03/17 2202   03/03/17 2000  vancomycin (VANCOCIN) IVPB 1000 mg/200 mL premix     1,000 mg 200 mL/hr over 60 Minutes Intravenous  Once 03/03/17 1950 03/03/17 2320     Subjective:  Wants to go home.   Notes he has bills to pay.  Denies SOB.   Objective: Vitals:   03/05/17 1032 03/05/17 1534 03/05/17 2057 03/06/17 0440  BP:  123/61 (!) 107/58 (!) 106/59  Pulse: 77 76 75 70  Resp:  16 16 18   Temp: 100.3 F (37.9 C) 99.1 F (37.3 C) 98.4 F (36.9 C) 98.7 F (37.1 C)  TempSrc: Oral Oral Oral Oral  SpO2: 92% 92% 93% 91%  Weight:    99.5 kg (219 lb 5.7 oz)  Height:        Intake/Output Summary (Last 24 hours) at 03/06/2017 1535 Last data filed at 03/06/2017 1300 Gross per 24 hour  Intake 600 ml  Output 1125 ml  Net -525 ml   Filed Weights   03/04/17 0520 03/05/17 0401 03/06/17 0440  Weight: 102.2 kg (225 lb 5 oz) 102.8 kg (226 lb 10.1 oz) 99.5 kg (219 lb 5.7 oz)    Examination:  General: No acute distress. Cardiovascular: Heart sounds show Shail Urbas regular rate, and rhythm. No gallops or rubs. No murmurs. No JVD. Lungs: Clear to auscultation bilaterally with good air movement. No  rales, rhonchi or wheezes. Abdomen: Soft, nontender, nondistended with normal active bowel sounds. No masses. No hepatosplenomegaly. Neurological: Alert and oriented 3. Moves all extremities 4 with equal strength. Cranial nerves II through XII grossly intact. Skin: maculopapular rash to bilateral arms, legs, chest Extremities: No clubbing or cyanosis. Improved LE edema to LLE Psychiatric: Mood and affect are agitated. Insight and judgment are impaired.   Data Reviewed: I have personally reviewed following labs and imaging studies  CBC: Recent Labs  Lab 03/03/17 1709 03/03/17 1727 03/04/17 0522 03/05/17 0633 03/06/17 0511  WBC 8.3  --  6.2 7.6 7.9  NEUTROABS 5.3  --   --   --   --   HGB 13.1 13.9 12.4* 12.8* 13.0  HCT 41.7 41.0 40.3 42.1 43.1  MCV 86.5  --  87.0 88.6 89.0  PLT 239  --  218 206 741   Basic Metabolic Panel: Recent Labs  Lab 03/03/17 1709 03/03/17 1727 03/04/17 0522 03/05/17 0633 03/06/17 0511  NA 140 142 138 138 139  K 3.9 4.0 4.2 4.0 3.8  CL 104 102 104 103 103  CO2 29  --  28 29 30   GLUCOSE 75 71 92 112* 89  BUN 15 14 13 12 10   CREATININE 1.07 1.10 1.02 1.24 0.99  CALCIUM 8.3*  --  7.8* 7.8* 8.0*   GFR: Estimated Creatinine Clearance: 74.6 mL/min (by C-G formula based on SCr of 0.99 mg/dL). Liver Function Tests: Recent Labs  Lab 03/03/17 1709 03/05/17 0633  AST 40 26  ALT 25 21  ALKPHOS 68 62  BILITOT 1.1 0.8  PROT 6.0* 5.5*  ALBUMIN 3.1* 2.6*   No results for input(s): LIPASE, AMYLASE in the last 168 hours. Recent Labs  Lab 03/03/17 2225  AMMONIA 34   Coagulation Profile: No results for input(s): INR, PROTIME in the last 168 hours. Cardiac Enzymes: Recent Labs  Lab 03/03/17 1709 03/03/17 1745 03/04/17 0522 03/05/17 0633  CKTOTAL 1,612*  --  998* 349  TROPONINI  --  <0.03  --   --    BNP (last 3 results) No results for input(s): PROBNP in the last 8760 hours. HbA1C: No results for input(s): HGBA1C in the last 72  hours. CBG: Recent Labs  Lab 03/04/17 0519 03/04/17 0723 03/04/17 1129 03/04/17 2042 03/05/17 0730  GLUCAP 98 92 139* 143* 141*  Lipid Profile: No results for input(s): CHOL, HDL, LDLCALC, TRIG, CHOLHDL, LDLDIRECT in the last 72 hours. Thyroid Function Tests: No results for input(s): TSH, T4TOTAL, FREET4, T3FREE, THYROIDAB in the last 72 hours. Anemia Panel: No results for input(s): VITAMINB12, FOLATE, FERRITIN, TIBC, IRON, RETICCTPCT in the last 72 hours. Sepsis Labs: Recent Labs  Lab 03/03/17 1727 03/05/17 0633 03/06/17 0511  PROCALCITON  --  0.17 <0.10  LATICACIDVEN 0.89  --   --     Recent Results (from the past 240 hour(s))  Culture, blood (routine x 2)     Status: None (Preliminary result)   Collection Time: 03/03/17  5:15 PM  Result Value Ref Range Status   Specimen Description BLOOD LEFT ANTECUBITAL  Final   Special Requests   Final    BOTTLES DRAWN AEROBIC AND ANAEROBIC Blood Culture adequate volume   Culture   Final    NO GROWTH 3 DAYS Performed at [facility], [place] [address], [place], [place] [address]    Report Status PENDING  Incomplete  Culture, blood (routine x 2)     Status: None (Preliminary result)   Collection Time: 03/03/17  5:40 PM  Result Value Ref Range Status   Specimen Description BLOOD LEFT HAND  Final   Special Requests IN PEDIATRIC BOTTLE Blood Culture adequate volume  Final   Culture   Final    NO GROWTH 3 DAYS Performed at [facility], [place] [address] [place], [place] [address]    Report Status PENDING  Incomplete  Urine culture     Status: Abnormal   Collection Time: 03/03/17  6:39 PM  Result Value Ref Range Status   Specimen Description URINE, CLEAN CATCH  Final   Special Requests Normal  Final   Culture MULTIPLE SPECIES PRESENT, SUGGEST RECOLLECTION (Alaze Garverick)  Final   Report Status 03/05/2017 FINAL  Final  MRSA PCR Screening     Status: None   Collection Time: 03/05/17 12:31 PM  Result Value Ref Range Status   MRSA  by PCR NEGATIVE NEGATIVE Final    Comment:        The GeneXpert MRSA Assay (FDA approved for NASAL specimens only), is one component of Damiya Sandefur comprehensive MRSA colonization surveillance program. It is not intended to diagnose MRSA infection nor to guide or monitor treatment for MRSA infections.          Radiology Studies: Ct Angio Chest Pe W Or Wo Contrast  Result Date: 03/05/2017 CLINICAL DATA:  Hypoxia. Left lower extremity swelling. Evaluate for pulmonary embolism. EXAM: CT ANGIOGRAPHY CHEST WITH CONTRAST TECHNIQUE: Multidetector CT imaging of the chest was performed using the standard protocol during bolus administration of intravenous contrast. Multiplanar CT image reconstructions and MIPs were obtained to evaluate the vascular anatomy. CONTRAST:  141mL ISOVUE-370 IOPAMIDOL (ISOVUE-370) INJECTION 76% COMPARISON:  Chest CTA 02/09/2017. FINDINGS: Cardiovascular: The pulmonary arteries are well opacified with contrast to the level of the subsegmental branches. There is no evidence of acute pulmonary embolism. There atherosclerosis of the aorta, great vessels and coronary arteries. The heart size is normal. There is no pericardial effusion. Mediastinum/Nodes: There are no enlarged mediastinal, hilar or axillary lymph nodes. There is Tulip Meharg small hiatal hernia. No definite abnormality of the trachea or thyroid gland on this examination. Lungs/Pleura: No pneumothorax or significant pleural effusion. There are persistent dependent airspace opacities in both lower lobes with associated volume loss. Overall, these appear slightly improved compared with the most recent study. There are new ground-glass densities in the  right upper lobe on images 48 through 59 series 6. Small lingular opacity appears similar. No endobronchial lesion is identified. Underlying emphysema noted. Upper abdomen: No acute findings are seen within the visualized upper abdomen. There is some reflux of contrast into the IVC.  Musculoskeletal/Chest wall: There is no chest wall mass or suspicious osseous finding. Review of the MIP images confirms the above findings. IMPRESSION: 1. No evidence of acute pulmonary embolism. 2. Slightly improved, although persistent dependent airspace opacities at both lung bases with associated volume loss. This could be due to chronic atelectasis or recurrent aspiration. New patchy ground-glass opacities in the right upper lobe, likely inflammatory. 3. Aortic Atherosclerosis (ICD10-I70.0) and Emphysema (ICD10-J43.9). Electronically Signed   By: Richardean Sale M.D.   On: 03/05/2017 14:18        Scheduled Meds: . amiodarone  200 mg Oral Daily  . arformoterol  15 mcg Nebulization BID  . aspirin EC  81 mg Oral Daily  . atorvastatin  40 mg Oral Daily  . budesonide (PULMICORT) nebulizer solution  0.5 mg Nebulization BID  . divalproex  750 mg Oral Daily  . enoxaparin (LOVENOX) injection  40 mg Subcutaneous QHS  . furosemide  20 mg Intravenous BID  . guaiFENesin  600 mg Oral BID  . ipratropium-albuterol  3 mL Nebulization Once  . ipratropium-albuterol  3 mL Inhalation TID  . levETIRAcetam  500 mg Oral BID  . pantoprazole  40 mg Oral Daily  . potassium chloride SA  20 mEq Oral Daily   Continuous Infusions:   LOS: 2 days    Time spent: over 30 minutes    Fayrene Helper, MD [facility] Pager [phone]  If 7PM-7AM, please contact night-coverage [email or web address] Password [email or web address] 03/06/2017, 3:35 PM

## 2017-03-06 NOTE — Progress Notes (Signed)
Pt refuses his tx. Pt states he has had to many.

## 2017-03-06 NOTE — Plan of Care (Signed)
Con to mon 

## 2017-03-06 NOTE — Progress Notes (Signed)
Entered pt room. Pt was on RA, SPO2 91%. Denies SOB. Walked to bathroom on RA. SPO2 87%. Continues to deny trouble breathing. At rest, pt 88%. Placed on 2lnc, 93%. MD updated

## 2017-03-06 NOTE — Progress Notes (Signed)
Pt. Refuses all medications again this shift.  Patient states that he does not need breathing medication and wants [place] to address his rash, not his breathing which according to him is fine.

## 2017-03-07 DIAGNOSIS — Z008 Encounter for other general examination: Secondary | ICD-10-CM

## 2017-03-07 DIAGNOSIS — G9341 Metabolic encephalopathy: Secondary | ICD-10-CM

## 2017-03-07 LAB — COMPREHENSIVE METABOLIC PANEL
ALT: 24 U/L (ref 17–63)
AST: 17 U/L (ref 15–41)
Albumin: 2.7 g/dL — ABNORMAL LOW (ref 3.5–5.0)
Alkaline Phosphatase: 67 U/L (ref 38–126)
Anion gap: 5 (ref 5–15)
BUN: 14 mg/dL (ref 6–20)
CO2: 33 mmol/L — AB (ref 22–32)
CREATININE: 0.83 mg/dL (ref 0.61–1.24)
Calcium: 8.5 mg/dL — ABNORMAL LOW (ref 8.9–10.3)
Chloride: 103 mmol/L (ref 101–111)
GFR calc Af Amer: >60 mL/min (ref >60)
GFR calc non Af Amer: >60 mL/min (ref >60)
Glucose, Bld: 141 mg/dL — ABNORMAL HIGH (ref 65–99)
POTASSIUM: 4.5 mmol/L (ref 3.5–5.1)
SODIUM: 141 mmol/L (ref 135–145)
Total Bilirubin: 0.7 mg/dL (ref 0.3–1.2)
Total Protein: 6 g/dL — ABNORMAL LOW (ref 6.5–8.1)

## 2017-03-07 LAB — CBC
HCT: 42.4 % (ref 39.0–52.0)
Hemoglobin: 13 g/dL (ref 13.0–17.0)
MCH: 27 pg (ref 26.0–34.0)
MCHC: 30.7 g/dL (ref 30.0–36.0)
MCV: 88 fL (ref 78.0–100.0)
Platelets: 222 10*3/uL (ref 150–400)
RBC: 4.82 MIL/uL (ref 4.22–5.81)
RDW: 20.6 % — ABNORMAL HIGH (ref 11.5–15.5)
WBC: 7.1 10*3/uL (ref 4.0–10.5)

## 2017-03-07 LAB — PROCALCITONIN

## 2017-03-07 LAB — GLUCOSE, CAPILLARY: Glucose-Capillary: 92 mg/dL (ref 65–99)

## 2017-03-07 MED ORDER — PREDNISONE 10 MG PO TABS: 40.0000 mg | ORAL_TABLET | Freq: Every day | ORAL | 0 refills | Status: AC

## 2017-03-07 MED ORDER — IPRATROPIUM-ALBUTEROL 0.5-2.5 (3) MG/3ML IN SOLN: 3.0000 mL | Freq: Two times a day (BID) | RESPIRATORY_TRACT | Status: DC

## 2017-03-07 NOTE — Discharge Summary (Signed)
Physician Discharge Summary  Patrick Holt PZW:[id number] DOB: Dec 06, 1950 DOA: 03/03/2017  PCP: [facility]  Admit date: 03/03/2017 Discharge date: 03/07/2017  Time spent: over 30 minutes  Recommendations for Outpatient Follow-up:  1. Follow up outpatient CBC/CMP 2. Continue to monitor respiratory status 3. Follow volume status and maintenance lasix dosing 4. Follow up with rheum for dermatomyositis 5. Pt with diffuse maculopapular rash here, unclear inciting agent, but suspected allergic reaction.  Cefepime and vanc added to med list.  Improving with prednisone.  Would recommend follow up with allergist as pt has an extensive allergy list.  6. Follow speech recommendations to reduce risk of aspiration 7. Consider follow up imaging   Discharge Diagnoses:  Principal Problem:   Evaluation by psychiatric service required Active Problems:   COPD (chronic obstructive pulmonary disease) (HCC)   Atrial fibrillation (HCC)   Hypoxia   Bipolar 1 disorder ([place])   Seizure disorder ([place])   Acute respiratory failure with hypoxia ([place])   Acute metabolic encephalopathy   Hypoglycemia   Discharge Condition: stable  Diet recommendation: dysphagia 3  Filed Weights   03/05/17 0401 03/06/17 0440 03/07/17 0450  Weight: 102.8 kg (226 lb 10.1 oz) 99.5 kg (219 lb 5.7 oz) 100 kg (220 lb 6.4 oz)    History of present illness:  Per admission H&P Patrick Holt is Patrick Holt 67 y.o. male with medical history significant of COPD, HTN, Patrick Holt. fib not on anticoagulation, bipolar disorder, seizure disorder, dermatomyositis, CVA; who presents with complaints of weakness after having Patrick Holt fall.  Patient was just recently hospitalized from 1/6 -1/9 after being found with acute respiratory failure with hypoxia and hypercapnia requiring BiPAP.  It appears patient was thought to be fluid overloaded treated with IV Lasix and discharged home on p.o. Lasix.  Some of the history is obtained from the  patient who is Patrick Holt poor historian.  At baseline he ambulates with the use of Patrick Holt walker or scooter.  Since being home patient reports going to the drugstore to get his medications, but for the last 4 days been unable to reach the medicines.  He reports having Patrick Holt fall where he hit the bridge of his nose and scraped his right knee what seems like 4 days ago as Patrick and that is the cause that he was unable to get to his medicines.  He reports associated symptoms of urinary incontinence, shortness of breath, productive cough.  Someone came to check on him today and that is when EMS was called.  Upon EMS arrival patient was noted to have O2 saturations in the 80s on room air and was placed on supplemental oxygen.   He was initially treated for possible pneumonia.  He was diuresed with lasix 20 mg IV BID.  He developed Patrick Holt rash and broad spectrum abx were discontinued.  He was started on steroids and he was able to be weaned from oxygen on 1/21 and discharged with continued lasix and steroids.  At times during the hospitalization he was asking to leave, but he was thought to lack capacity to make that decision earlier in his hospitalization.  Psychiatry was consulted on 1/19 and at that time thought he lacked the capacity to make that decision, they were reconsulted on 1/21 and at that time he was thought to have the capacity.   Hospital Course:  Acute respiratory failure with hypoxia, history of COPD: Patient noted to have O2 saturations in the 80s on room air upon EMS arrival. At baseline  patient not on oxygen. Venous blood gas showed no signs of hypercarbia.  Initially thought infectious, but normal WBC and procalcitonin not elevated.  He's had low grade temperatures, no true fevers.  His antibiotics were discontinued with normal WBC count and procalcitonin and development of diffuse maculopapular rash (see below).  Question aspiration with CTA findings?  Question contribution of his dermatomyositis? Pulmonary  involvement? (discussed his CTA with pulmonology on call today who did not think findings c/w this).  Patrick Holt was diuresed and started on steroids for possible contribution of COPD.  On the day of discharge he actually was doing Patrick on room air and did not need to d/c with O2.   CXR on presentation with "streaky bibasilar atelectasis vs mild infiltrates".   Repeat CXR 1/18 with low lung volumes but persistent bibasilar atelectasis/infiltrates "findings have progressed from prior exam".  CTA given LLE swelling on 1/19 notable for no PE.  New patchy ground lass opacities in RUL.  Persistent dependent airspace opacities at both lung bases with associated volume loss.  Consider atelectasis vs recurrent aspiration. [ ]  Speech evaluation -> noted to be "mild aspiration risk".  recommended dysphagia 3 diet, thin liquids - Vanc/cefepime discontinued (rash below) - follow procalcitonin (normal at d/c) [ ]  blood cultures NGTD x4 days at d/c [ ]  sputum cx not collected [ ]  urine strep and urine legionella negative [ ]  RVP negative - discharged with prednisone for 5 day total course -Brovana and budesonide nebs -DuoNeb's  Diastolic CHF: Last EF noted to be 65-70% with grade 1 diastolic dysfunction on 71/07/2692. Chest x-ray initially with cardiomegaly and vascular congestion, but improved on repeat imaging BNP noted to be slightly higher than previous at 77.2.  - Diuresed with lasix 20 IV BID - discharged with instructions to take twice daily and then resume once daily -StrictI&Osand daily weights - Will hold off additional fluids  Filed Weights   03/05/17 0401 03/06/17 0440 03/07/17 0450  Weight: 102.8 kg (226 lb 10.1 oz) 99.5 kg (219 lb 5.7 oz) 100 kg (220 lb 6.4 oz)    Rash:  Diffuse maculopapular rash appeared overnight on 1/18-1/19.  He'd been receiving vanc/cefepime.  These have been stopped and were added to allergy list, though it's unclear which if either caused this or if  there were another culprit.  His rash was improving on day of discharge with prednisone.  Recommend outpatient allergy follow up given many allergies to drugs.   Acute metabolic encephalopathy:  On presentation it looks like he was somnolent, by the time our admitter saw him, he was oriented, but noted to have poor insight and judgement.  Suspect encephalopathy related to fall, extended time down on the ground, and hypoxia as Patrick as possible low BG (initial CBG 71).  He's had poor insight/judgement during his stay and was thought not to have the capacity to decline therapy earlier in his course prior to discharge. - Ammonia normal.  Utox with benzos (he takes at home).  - HFP normal.  - Head CT without acute intracranial abnormality.  - repeat VBG without hypercarbia -Neurochecks - TSH normal  - check valproic acid level (low) - Monitor for seizure activity - treat above - will continue to monitor  Capacity: Mr. Dyar was seen by psych yesterday on 1/19.  At the time he was asking to go home, but was persistently hypoxic and was not felt to have the capacity to make medical decisions at that time so psych was consulted and  noted to not have the capacity to make medical decisions at that time.  Psych was reconsulted on 1/21 to determine if he had the capacity to decline SNF given his recurrent falls at home and during my discussions with him, he did not acknowledge the risk of recurrent falls and was still not clearly demonstrating that he understood the consequences of declining further workup and treatment.  He was seen by Dr. Mariea Clonts who noted that he did have capacity to decline SNF at the time of discharge.   Left lower extremity edema: lower extremity US without evidence of DVT (though limited exam).  Improving with diuresis.     Hypoglycemia:Patient's initial CBG was noted to be 71. Patient was given 25 mL of 50% dextrose with blood sugar.  Improved. - continue to  monitor  Elevated HC:WCBJSEGB  History of dermatomyositis:Patient currently not on anytreatment for this condition and is monitored by rheumatology. -Continue outpatient follow-up with rheumatology  History of seizure disorder -seizure precautions -Continue Keppra  Paroxysmal atrial fibrillation:Patient appears to be in sinus rhythm at this time. Not on anticoagulation due to recurrent falls and seizure history. -Continue amiodarone  Essential hypertension -Hold amlodipine due to soft blood pressures  History of bipolar disorder:Stable -continueDepakote  Hyperlipidemia -Continue atorvastatin  Procedures: LE [place] 1/20 Final Interpretation: Left: There is no evidence of deep vein thrombosis in the lower extremity. However, portions of this examination were limited- see technologist comments above. No cystic structure found in the popliteal fossa.  *See table(s) above for measurements and observations.  Consultations:  Psychiatry  Discharge Exam: Vitals:   03/06/17 2038 03/07/17 0451  BP: (!) 113/53 136/61  Pulse: 71 62  Resp: 19 18  Temp: 98.8 F (37.1 C) 98.9 F (37.2 C)  SpO2: 93% 96%   Denies any complaints.  Wants to go home.  General: No acute distress. Cardiovascular: Heart sounds show Patrick Holt regular rate, and rhythm. No gallops or rubs. No murmurs. No JVD. Lungs: No increased WOB.  Diffuse rhonchi.  Abdomen: Soft, nontender, nondistended with normal active bowel sounds. No masses. No hepatosplenomegaly. Neurological: Alert and oriented 3. Moves all extremities 4 with equal strength. Cranial nerves II through XII grossly intact. Skin: Warm and dry. No rashes or lesions. Extremities: No clubbing or cyanosis. Trace edema. Psychiatric: Mood and affect are normal. Insight and judgment are appropriate.   Discharge Instructions   Discharge Instructions    Call MD for:  difficulty breathing, headache or visual disturbances   Complete by:  As  directed    Call MD for:  extreme fatigue   Complete by:  As directed    Call MD for:  hives   Complete by:  As directed    Call MD for:  persistant dizziness or light-headedness   Complete by:  As directed    Call MD for:  persistant nausea and vomiting   Complete by:  As directed    Call MD for:  redness, tenderness, or signs of infection (pain, swelling, redness, odor or green/yellow discharge around incision site)   Complete by:  As directed    Call MD for:  temperature >100.4   Complete by:  As directed    Discharge diet:   Complete by:  As directed    Mechanical soft  Diet Recommendation Dysphagia 3 (Mech soft);Thin liquid   Liquid Administration via: Cup;Straw Medication Administration: Whole meds with liquid Supervision: none Compensations: Minimize environmental distractions;Slow rate;Small sips/bites Postural Changes: Seated upright at 90 degrees;Remain upright for at  least 30 minutes after po intake   Other  Recommendations Oral Care Recommendations: Oral care BID   Discharge instructions   Complete by:  As directed    You were seen in the hospital for Patrick Holt fall, confusion, and decreased oxygen levels.  Your oxygen level improved with lasix (water pill) and steroids.  Please continue lasix twice Natoshia Souter day for the next 3 days, then decrease the dose to daily.  We're sending you home with home health physical therapy, occupational therapy, nursing, an aide, and social work.  Please return if you have worsening shortness of breath or other new or concerning symptoms.  We started you on steroids for you breathing.  This will also help with the rash (which is improving).  You were seen by speech who had some recommendations to prevent you from aspirating.  Please follow up with your PCP within the week to check labs and your breathing.  It may be beneficial for you to see Zynia Wojtowicz lung doctor and an allergy doctor.  Follow up with your rheumatologist.  Please ask your PCP to request records  from this hospitalization so they know what was happening and what next steps to take.   Increase activity slowly   Complete by:  As directed      Allergies as of 03/07/2017      Reactions   Immune Globulins Other (See Comments)   Acute renal failure   Ciprofloxacin Swelling   Flagyl [metronidazole] Swelling   Lisinopril Diarrhea   Sulfa Antibiotics Other (See Comments)   blisters   Cefepime    Diffuse maculopapular rash when receiving both vanc and cefepime, timing unclear.   Morphine And Related    Does not help with pain   Requip [ropinirole Hcl]    constipation   Vancomycin    Diffuse maculopapular rash when receiving both vanc/cefepime.  Timing unclear.     Betamethasone Dipropionate Other (See Comments)   Unknown   Bupropion Hcl Other (See Comments)   Unknown   Clobetasol Other (See Comments)   Unknown   Codeine Other (See Comments)   Does not help with pain   Escitalopram Oxalate Other (See Comments)   Unknown   Fluoxetine Hcl Other (See Comments)   Unknown   Meclizine Rash   Paroxetine Other (See Comments)   Unknown   Penicillins Other (See Comments)   Unknown reaction Has patient had Neldon Shepard PCN reaction causing immediate rash, facial/tongue/throat swelling, SOB or lightheadedness with hypotension: unknown Has patient had Fallon Haecker PCN reaction causing severe rash involving mucus membranes or skin necrosis: unknown Has patient had Earon Rivest PCN reaction that required hospitalization unknown Has patient had Shadd Dunstan PCN reaction occurring within the last 10 years: unknown If all of the above answers are "NO", then may proceed with Cephalosporin use. Unknown Unknown reactio   Tacrolimus Other (See Comments)   Unknown   Tetanus Toxoid Other (See Comments)   Unknown   Tuberculin Purified Protein Derivative Other (See Comments)   Unknown      Medication List    STOP taking these medications   ZITHROMAX Z-PAK 250 MG tablet Generic drug:  azithromycin     TAKE these medications    ALPRAZolam 1 MG tablet Commonly known as:  XANAX Take 1 mg by mouth 3 (three) times daily.   amiodarone 200 MG tablet Commonly known as:  PACERONE Take 1 tablet (200 mg total) by mouth daily.   amLODipine 5 MG tablet Commonly known as:  NORVASC Take 1 tablet (  5 mg total) by mouth daily.   aspirin EC 81 MG tablet Take 1 tablet (81 mg total) by mouth daily.   atorvastatin 40 MG tablet Commonly known as:  LIPITOR Take 1 tablet (40 mg total) by mouth daily.   budesonide-formoterol 160-4.5 MCG/ACT inhaler Commonly known as:  SYMBICORT Inhale 2 puffs into the lungs 2 (two) times daily.   divalproex 250 MG 24 hr tablet Commonly known as:  DEPAKOTE ER Take 3 tablets (750 mg total) by mouth daily.   ergocalciferol 50000 units capsule Commonly known as:  VITAMIN D2 Take 50,000 Units by mouth once Braiden Rodman week.   esomeprazole 40 MG capsule Commonly known as:  NEXIUM Take 40 mg by mouth daily.   furosemide 20 MG tablet Commonly known as:  LASIX Take 1 tablet (20 mg total) by mouth daily.   ipratropium-albuterol 0.5-2.5 (3) MG/3ML Soln Commonly known as:  DUONEB Inhale 3 mLs into the lungs every 6 (six) hours as needed. What changed:  reasons to take this   levETIRAcetam 500 MG tablet Commonly known as:  KEPPRA Take 500 mg by mouth 2 (two) times daily.   loperamide 2 MG capsule Commonly known as:  IMODIUM Take 2 mg by mouth as needed for diarrhea or loose stools.   multivitamin with minerals Tabs tablet Take 1 tablet by mouth daily.   oxybutynin 5 MG tablet Commonly known as:  DITROPAN Take 15 mg by mouth daily.   Potassium Chloride ER 20 MEQ Tbcr Take 20 mEq by mouth daily.   predniSONE 10 MG tablet Commonly known as:  DELTASONE Take 4 tablets (40 mg total) by mouth daily for 3 days. What changed:    medication strength  how much to take  when to take this  additional instructions   traMADol 50 MG tablet Commonly known as:  ULTRAM Take 50 mg by mouth every  6 (six) hours as needed for moderate pain.      Allergies  Allergen Reactions  . Immune Globulins Other (See Comments)    Acute renal failure  . Ciprofloxacin Swelling  . Flagyl [Metronidazole] Swelling  . Lisinopril Diarrhea  . Sulfa Antibiotics Other (See Comments)    blisters  . Cefepime     Diffuse maculopapular rash when receiving both vanc and cefepime, timing unclear.  . Morphine And Related     Does not help with pain  . Requip [Ropinirole Hcl]     constipation  . Vancomycin     Diffuse maculopapular rash when receiving both vanc/cefepime.  Timing unclear.    . Betamethasone Dipropionate Other (See Comments)    Unknown  . Bupropion Hcl Other (See Comments)    Unknown  . Clobetasol Other (See Comments)    Unknown  . Codeine Other (See Comments)    Does not help with pain  . Escitalopram Oxalate Other (See Comments)    Unknown  . Fluoxetine Hcl Other (See Comments)    Unknown  . Meclizine Rash  . Paroxetine Other (See Comments)    Unknown  . Penicillins Other (See Comments)    Unknown reaction Has patient had Reinhold Rickey PCN reaction causing immediate rash, facial/tongue/throat swelling, SOB or lightheadedness with hypotension: unknown Has patient had Kyung Muto PCN reaction causing severe rash involving mucus membranes or skin necrosis: unknown Has patient had Kennidee Heyne PCN reaction that required hospitalization unknown Has patient had Lavonn Maxcy PCN reaction occurring within the last 10 years: unknown If all of the above answers are "NO", then may proceed with Cephalosporin use.  Unknown Unknown reactio  . Tacrolimus Other (See Comments)    Unknown  . Tetanus Toxoid Other (See Comments)    Unknown  . Tuberculin Purified Protein Derivative Other (See Comments)    [facility] Follow up.   Specialty:  Home Health Services Why:  [place] therapy,aide,social worker Contact information: [place] [address] [phone]        [facility] Follow up.   Why:  Please call for Patrick Holt follow up appointment within 1 week Contact information: [place] [address] [phone]            The results of significant diagnostics from this hospitalization (including imaging, microbiology, ancillary and laboratory) are listed below for reference.    Significant Diagnostic Studies: Dg Chest 2 View  Result Date: 03/04/2017 CLINICAL DATA:  Shortness of breath. EXAM: CHEST  2 VIEW COMPARISON:  03/03/2017. FINDINGS: Cardiomegaly with normal pulmonary vascularity. No pulmonary venous congestion noted on today's exam. Low lung volumes with mild bibasilar atelectasis/infiltrates, progressed from prior exam. No pleural effusion or pneumothorax. Degenerative changes thoracic spine. IMPRESSION: Low lung volumes with bibasilar atelectasis/infiltrates. Findings have progressed from prior exam. 2. Stable cardiomegaly. No pulmonary venous congestion noted on today's exam. Electronically Signed   By: Marcello Moores  Register   On: 03/04/2017 10:54   Dg Chest 2 View  Result Date: 03/03/2017 CLINICAL DATA:  Generalize weakness EXAM: CHEST  2 VIEW COMPARISON:  06-Oct-202019 FINDINGS: Cardiomegaly with vascular congestion. Streaky bibasilar atelectasis or infiltrates. Aortic atherosclerosis. No pneumothorax. Degenerative changes of the spine. IMPRESSION: 1. Cardiomegaly with vascular congestion 2. Streaky bibasilar atelectasis versus mild infiltrates. Electronically Signed   By: Donavan Foil M.D.   On: 03/03/2017 16:47   Ct Head Wo Contrast  Result Date: 03/03/2017 CLINICAL DATA:  Altered level of consciousness EXAM: CT HEAD WITHOUT CONTRAST TECHNIQUE: Contiguous axial images were obtained from the base of the skull through the vertex without intravenous contrast. COMPARISON:  CT brain 06-Oct-202019, 02/05/2017 FINDINGS: Brain: No acute territorial infarction, hemorrhage or  intracranial mass is visualized. Old lacunar infarct in the central pons. Moderate vessel ischemic changes of the white matter. Mild to moderate atrophy. Stable ventricle size Vascular: No hyperdense vessels.  Carotid artery calcification Skull: Normal. Negative for fracture or focal lesion. Sinuses/Orbits: Mild mucosal thickening in the ethmoid sinuses. No acute orbital abnormality. Other: None. IMPRESSION: 1. No CT evidence for acute intracranial abnormality. 2. Atrophy and small vessel ischemic changes of the white matter. Electronically Signed   By: Donavan Foil M.D.   On: 03/03/2017 19:45   Ct Head Wo Contrast  Result Date: 02/20/2017 CLINICAL DATA:  Altered mental status, fell this morning, denies striking head, became hypotensive normal larger and disoriented after he was felt to Neftali Thurow sitting position, history atrial fibrillation, prior subarachnoid hemorrhage, collagen vascular disease COPD sites, GERD hypertension, ischemic heart disease post MI, type II diabetes mellitus EXAM: CT HEAD WITHOUT CONTRAST TECHNIQUE: Contiguous axial images were obtained from the base of the skull through the vertex without intravenous contrast. Sagittal and coronal MPR images reconstructed from axial data set. COMPARISON:  02/05/2017 FINDINGS: Brain: Generalized atrophy. Normal ventricular morphology. No midline shift or mass effect. Small vessel chronic ischemic changes of deep cerebral white matter. No intracranial hemorrhage, mass lesion, evidence of acute infarction, or extra-axial fluid collection. Vascular: Atherosclerotic calcification of internal carotid arteries at skullbase Skull: Demineralized  but intact Sinuses/Orbits: Clear Other: N/[place] IMPRESSION: Atrophy with small vessel chronic ischemic changes of deep cerebral white matter. No acute intracranial abnormalities. Electronically Signed   By: Lavonia Dana M.D.   On: 14-Jun-202019 15:19   Ct Angio Chest Pe W Or Wo Contrast  Result Date: 03/05/2017 CLINICAL DATA:   Hypoxia. Left lower extremity swelling. Evaluate for pulmonary embolism. EXAM: CT ANGIOGRAPHY CHEST WITH CONTRAST TECHNIQUE: Multidetector CT imaging of the chest was performed using the standard protocol during bolus administration of intravenous contrast. Multiplanar CT image reconstructions and MIPs were obtained to evaluate the vascular anatomy. CONTRAST:  170mL ISOVUE-370 IOPAMIDOL (ISOVUE-370) INJECTION 76% COMPARISON:  Chest CTA 02/09/2017. FINDINGS: Cardiovascular: The pulmonary arteries are Patrick opacified with contrast to the level of the subsegmental branches. There is no evidence of acute pulmonary embolism. There atherosclerosis of the aorta, great vessels and coronary arteries. The heart size is normal. There is no pericardial effusion. Mediastinum/Nodes: There are no enlarged mediastinal, hilar or axillary lymph nodes. There is Shyia Fillingim small hiatal hernia. No definite abnormality of the trachea or thyroid gland on this examination. Lungs/Pleura: No pneumothorax or significant pleural effusion. There are persistent dependent airspace opacities in both lower lobes with associated volume loss. Overall, these appear slightly improved compared with the most recent study. There are new ground-glass densities in the right upper lobe on images 48 through 59 series 6. Small lingular opacity appears similar. No endobronchial lesion is identified. Underlying emphysema noted. Upper abdomen: No acute findings are seen within the visualized upper abdomen. There is some reflux of contrast into the IVC. Musculoskeletal/Chest wall: There is no chest wall mass or suspicious osseous finding. Review of the MIP images confirms the above findings. IMPRESSION: 1. No evidence of acute pulmonary embolism. 2. Slightly improved, although persistent dependent airspace opacities at both lung bases with associated volume loss. This could be due to chronic atelectasis or recurrent aspiration. New patchy ground-glass opacities in the right  upper lobe, likely inflammatory. 3. Aortic Atherosclerosis (ICD10-I70.0) and Emphysema (ICD10-J43.9). Electronically Signed   By: Richardean Sale M.D.   On: 03/05/2017 14:18   Ct Angio Chest Pe W Or Wo Contrast  Result Date: 02/09/2017 CLINICAL DATA:  Acute onset of generalized chest pain and shortness of breath. EXAM: CT ANGIOGRAPHY CHEST WITH CONTRAST TECHNIQUE: Multidetector CT imaging of the chest was performed using the standard protocol during bolus administration of intravenous contrast. Multiplanar CT image reconstructions and MIPs were obtained to evaluate the vascular anatomy. CONTRAST:  173mL ISOVUE-370 IOPAMIDOL (ISOVUE-370) INJECTION 76% COMPARISON:  Chest radiograph performed 02/08/2017 FINDINGS: Cardiovascular: There is no evidence of significant pulmonary embolus. The heart is normal in size. Scattered coronary artery calcifications are seen. Mild calcification is noted along the thoracic aorta and proximal great vessels. Mediastinum/Nodes: No mediastinal lymphadenopathy is seen. No pericardial effusion is identified. The visualized portions of the thyroid gland are unremarkable. No axillary lymphadenopathy is appreciated. Mild tracheobronchomalacia is noted. Lungs/Pleura: Small bilateral pleural effusions are noted. There is partial consolidation of both lower lung lobes, which may reflect atelectasis or pneumonia. Mild interstitial prominence is noted. No pneumothorax is seen. Upper Abdomen: The visualized portions of the liver and spleen are unremarkable. Minimal soft tissue inflammation about the gallbladder is nonspecific. The visualized portions of the adrenal glands are within normal limits. Musculoskeletal: No acute osseous abnormalities are identified. The visualized musculature is unremarkable in appearance. Review of the MIP images confirms the above findings. IMPRESSION: 1. No evidence of significant pulmonary embolus. 2. Small bilateral pleural  effusions. Partial consolidation of  both lower lung lobes, which may reflect atelectasis or pneumonia. Mild interstitial prominence noted. 3. Mild tracheobronchomalacia. 4. Scattered coronary artery calcifications seen. 5. Minimal nonspecific soft tissue inflammation about the gallbladder. Electronically Signed   By: Garald Balding M.D.   On: 02/09/2017 22:38   Dg Chest Port 1 View  Result Date: 02/20/2017 CLINICAL DATA:  Altered mental status, fall, hypotension EXAM: PORTABLE CHEST 1 VIEW COMPARISON:  CTA chest dated 02/09/2017 FINDINGS: Mild bilateral lower lobe opacities, atelectasis versus pneumonia. Small right pleural effusion. Cardiomegaly with pulmonary vascular congestion. No frank interstitial edema. No pneumothorax. IMPRESSION: Mild bilateral lower lobe opacities, atelectasis versus pneumonia. Small right pleural effusion. Cardiomegaly pulmonary vascular congestion. No frank interstitial edema. Electronically Signed   By: Julian Hy M.D.   On: 09/01/2017 12:21   Dg Chest Port 1 View  Result Date: 02/08/2017 CLINICAL DATA:  COPD.  Fall with weakness.  Atrial fibrillation. EXAM: PORTABLE CHEST 1 VIEW COMPARISON:  02/05/2017. FINDINGS: The heart is enlarged. Subsegmental atelectatic change the bases without definite consolidation or frank edema. Small BILATERAL effusions are likely. There is no pneumothorax. IMPRESSION: Similar appearance to priors. Cardiomegaly. Bibasilar subsegmental atelectasis. Electronically Signed   By: Staci Righter M.D.   On: 02/08/2017 08:24    Microbiology: Recent Results (from the past 240 hour(s))  Culture, blood (routine x 2)     Status: None (Preliminary result)   Collection Time: 03/03/17  5:15 PM  Result Value Ref Range Status   Specimen Description BLOOD LEFT ANTECUBITAL  Final   Special Requests   Final    BOTTLES DRAWN AEROBIC AND ANAEROBIC Blood Culture adequate volume   Culture   Final    NO GROWTH 4 DAYS Performed at [facility], [address] [place], [place]  [address]    Report Status PENDING  Incomplete  Culture, blood (routine x 2)     Status: None (Preliminary result)   Collection Time: 03/03/17  5:40 PM  Result Value Ref Range Status   Specimen Description BLOOD LEFT HAND  Final   Special Requests IN PEDIATRIC BOTTLE Blood Culture adequate volume  Final   Culture   Final    NO GROWTH 4 DAYS Performed at [facility], [place] [address], [place], [place] [address]    Report Status PENDING  Incomplete  Urine culture     Status: Abnormal   Collection Time: 03/03/17  6:39 PM  Result Value Ref Range Status   Specimen Description URINE, CLEAN CATCH  Final   Special Requests Normal  Final   Culture MULTIPLE SPECIES PRESENT, SUGGEST RECOLLECTION (Patrick Holt)  Final   Report Status 03/05/2017 FINAL  Final  MRSA PCR Screening     Status: None   Collection Time: 03/05/17 12:31 PM  Result Value Ref Range Status   MRSA by PCR NEGATIVE NEGATIVE Final    Comment:        The GeneXpert MRSA Assay (FDA approved for NASAL specimens only), is one component of [facility] comprehensive MRSA colonization surveillance program. It is not intended to diagnose MRSA infection nor to guide or monitor treatment for MRSA infections.   Respiratory Panel by PCR     Status: None   Collection Time: 03/05/17  5:13 PM  Result Value Ref Range Status   Adenovirus NOT DETECTED NOT DETECTED Final   Coronavirus 229E NOT DETECTED NOT DETECTED Final   Coronavirus HKU1 NOT DETECTED NOT DETECTED Final   Coronavirus NL63 NOT DETECTED NOT  DETECTED Final   Coronavirus OC43 NOT DETECTED NOT DETECTED Final   Metapneumovirus NOT DETECTED NOT DETECTED Final   Rhinovirus / Enterovirus NOT DETECTED NOT DETECTED Final   Influenza Sajad Glander NOT DETECTED NOT DETECTED Final   Influenza B NOT DETECTED NOT DETECTED Final   Parainfluenza Virus 1 NOT DETECTED NOT DETECTED Final   Parainfluenza Virus 2 NOT DETECTED NOT DETECTED Final   Parainfluenza Virus 3 NOT DETECTED NOT DETECTED Final   Parainfluenza  Virus 4 NOT DETECTED NOT DETECTED Final   Respiratory Syncytial Virus NOT DETECTED NOT DETECTED Final   Bordetella pertussis NOT DETECTED NOT DETECTED Final   Chlamydophila pneumoniae NOT DETECTED NOT DETECTED Final   Mycoplasma pneumoniae NOT DETECTED NOT DETECTED Final    Comment: Performed at [facility], [place] [address], [place], [place] [address]     Labs: Basic Metabolic Panel: Recent Labs  Lab 03/03/17 1709 03/03/17 1727 03/04/17 0522 03/05/17 3419 03/06/17 0511 03/07/17 0540  NA 140 142 138 138 139 141  K 3.9 4.0 4.2 4.0 3.8 4.5  CL 104 102 104 103 103 103  CO2 29  --  28 29 30  33*  GLUCOSE 75 71 92 112* 89 141*  BUN 15 14 13 12 10 14   CREATININE 1.07 1.10 1.02 1.24 0.99 0.83  CALCIUM 8.3*  --  7.8* 7.8* 8.0* 8.5*   Liver Function Tests: Recent Labs  Lab 03/03/17 1709 03/05/17 0633 03/07/17 0540  AST 40 26 17  ALT 25 21 24   ALKPHOS 68 62 67  BILITOT 1.1 0.8 0.7  PROT 6.0* 5.5* 6.0*  ALBUMIN 3.1* 2.6* 2.7*   No results for input(s): LIPASE, AMYLASE in the last 168 hours. Recent Labs  Lab 03/03/17 2225  AMMONIA 34   CBC: Recent Labs  Lab 03/03/17 1709 03/03/17 1727 03/04/17 0522 03/05/17 0633 03/06/17 0511 03/07/17 0540  WBC 8.3  --  6.2 7.6 7.9 7.1  NEUTROABS 5.3  --   --   --   --   --   HGB 13.1 13.9 12.4* 12.8* 13.0 13.0  HCT 41.7 41.0 40.3 42.1 43.1 42.4  MCV 86.5  --  87.0 88.6 89.0 88.0  PLT 239  --  218 206 208 222   Cardiac Enzymes: Recent Labs  Lab 03/03/17 1709 03/03/17 1745 03/04/17 0522 03/05/17 0633  CKTOTAL 3,790*  --  998* 349  TROPONINI  --  <0.03  --   --    BNP: BNP (last 3 results) Recent Labs    03/03/17 1709  BNP 77.2    ProBNP (last 3 results) No results for input(s): PROBNP in the last 8760 hours.  CBG: Recent Labs  Lab 03/04/17 0723 03/04/17 1129 03/04/17 1959 03/04/17 2042 03/05/17 0730  GLUCAP 92 139* 92 143* 141*       Signed:  Fayrene Helper MD.  [facility] 03/07/2017,  5:51 PM

## 2017-03-07 NOTE — Care Management Note (Signed)
Case Management Note  Patient Details  Name: Patrick Holt MRN: [id number] Date of Birth: 1950-10-02  Subjective/Objective: Patient is active w/[employer] [facility] rep [place] aware of d/c today home, & that patient pleasantly declined SNF. Patient informed me that his phone is not working properly but he has his house key, & home care nurse can call main tel# for [employer] for entrance into building-informed [employer] rep [place]. Recc HHRN/PT/OT/aide/CSW. Home 02 is not needed.Will transp home by [facility]AR-forms in shadow chart-nurse can call when ready.                  Action/Plan:d/c home w/HHC.   Expected Discharge Date:  (unknown)               Expected Discharge Plan:  [place]  In-House Referral:  Clinical Social Work  Discharge planning Services  CM Consult  Post Acute Care Choice:    Choice offered to:  Patient  DME Arranged:    DME Agency:     HH Arranged:    [place] Agency:     Status of Service:  In process, will continue to follow  If discussed at Long Length of Stay Meetings, dates discussed:    Additional Comments:  Dessa Phi, RN 03/07/2017, 3:15 PM

## 2017-03-07 NOTE — Progress Notes (Signed)
Pt refused his am breathing neb tx. No distress noted at this time.

## 2017-03-07 NOTE — Progress Notes (Signed)
Patient declined SNF 03/04/2017. Patient seen by psych on 03/07/2017 for capacity to refuse SNF. Per psych note "patient demonstrates capacity to refuse SNF placement". Patient to discharge home, RNCM following to assist with home heath service needs. CSW signing off, no other needs identified at this time.  Patrick Holt, [place] Social Worker [facility] Cell#: [phone]

## 2017-03-07 NOTE — Consult Note (Addendum)
[facility] Consult   Reason for Consult:  Capacity to refuse SNF Referring Physician:  Dr. Florene Glen Patient Identification: Patrick Holt MRN:  [id number] Principal Diagnosis: Evaluation by psychiatric service required Diagnosis:   Patient Active Problem List   Diagnosis Date Noted  . Acute metabolic encephalopathy [A63.01] 03/04/2017  . Hypoglycemia [E16.2] 03/04/2017  . Acute respiratory failure with hypoxia ([place]) [J96.01] Jun 27, 202019  . Acute encephalopathy [G93.40] Jun 27, 202019  . Rhabdomyolysis [M62.82] 02/05/2017  . Acute respiratory failure with hypoxia and hypercapnia ([place]) [J96.01, J96.02] 02/05/2017  . COPD with acute exacerbation ([place]) [J44.1] 02/05/2017  . Bipolar 1 disorder ([place]) [F31.9] 02/05/2017  . Anemia [D64.9] 02/05/2017  . Hypertension [I10] 02/05/2017  . Hand fracture, right [S62.91XA] 02/05/2017  . Narcotic dependence ([place]) [F11.20] 02/05/2017  . Pre-diabetes [R73.03] 02/05/2017  . PAF (paroxysmal atrial fibrillation) ([place]) [I48.0] 02/05/2017  . Frequent falls [R29.6] 02/05/2017  . History of dermatomyositis [Z87.2] 02/05/2017  . Seizure disorder ([place]) [S01.093] 02/05/2017  . Bilateral lower extremity edema [R60.0] 02/05/2017  . Pulmonary artery hypertension ([place]) [I27.21] 01/15/2017  . Altered mental status [R41.82] 01/13/2017  . Low oxygen saturation [R79.81]   . Pain in right wrist [M25.531] 02/27/2016  . Altered mental state [R41.82] 11/14/2015  . Severe dehydration [E86.0] 11/14/2015  . Hypernatremia [E87.0] 11/14/2015  . Difficulty in walking, not elsewhere classified [R26.2]   . Right wrist fracture [S62.101A]   . Closed displaced fracture of ulnar styloid with routine healing [S52.613D]   . Scapholunate ligament injury with no instability [S69.90XA]   . Falls frequently [R29.6]   . Hypoxia [R09.02] 11/02/2015  . Anxiety [F41.9] 06/06/2015  . Impaired mobility [Z74.09] 04/28/2015  . Lumbar spondylosis with myelopathy [M47.16]  03/07/2015  . Nocturia [R35.1] 12/03/2014  . Chronic venous insufficiency [I87.2] 09/23/2014  . Poor social situation [Z65.9] 07/30/2014  . Schizophrenia, acute ([place]) [F23]   . Schizoaffective disorder, bipolar type ([place]) [F25.0]   . Decreased visual acuity [H54.7] 06/27/2014  . COPD exacerbation ([place]) [J44.1]   . Right sided weakness [R53.1] 01/04/2014  . Essential hypertension, benign [I10]   . Atrial fibrillation, unspecified [I48.91]   . Radiculopathy of cervical region [M54.12]   . Polyuria [R35.8] 12/07/2013  . Erectile dysfunction [N52.9] 12/07/2013  . Diverticulitis [K57.92] 08/01/2013  . Acute diverticulitis [K57.92] 08/01/2013  . Hematochezia [K92.1] 06/04/2013  . Hypokalemia [E87.6] 10/03/2012  . Benign neoplasm of colon [D12.6] 09/27/2012  . Stricture and stenosis of esophagus [K22.2] 09/27/2012  . Diverticulitis of colon without hemorrhage [K57.32] 07/26/2012  . Polypharmacy [Z79.899] 03/26/2012  . Seizures ([place]) [R56.9] 02/10/2012  . Atrial fibrillation ([place]) [I48.91] 02/05/2012  . Coronary atherosclerosis of native coronary artery [I25.10] 02/05/2012  . Radicular low back pain [M54.10] 03/09/2011  . History of seizure disorder [Z86.69] 01/29/2011  . Irritable bowel syndrome (IBS) [K58.9] 06/24/2010  . Urge incontinence [N39.41] 06/19/2010  . TOBACCO USER [F17.200] 11/23/2008  . DEPRESSION, MAJOR [F32.9] 05/31/2008  . ISCHEMIC HEART DISEASE [I25.9] 05/31/2008  . RAYNAUD'S DISEASE [I73.00] 05/31/2008  . HIATAL HERNIA [K44.9] 05/31/2008  . Rheumatoid arthritis ([place]) [M06.9] 05/31/2008  . FIBROMYALGIA [IMO0001] 05/31/2008  . Hyperlipidemia [E78.5] 01/26/2008  . Anxiety state [F41.1] 01/26/2008  . HEMORRHOIDS, INTERNAL [K64.8] 01/26/2008  . COPD (chronic obstructive pulmonary disease) ([place]) [J44.9] 01/26/2008  . ESOPHAGEAL MOTILITY DISORDER [K22.4] 01/26/2008  . GERD [K21.9] 01/26/2008  . 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 male patient  admitted with acute respiratory failure with hypoxia.  HPI:   Per chart review, patient was recently admitted with acute respiratory failure with hypoxia and hypercarbia due to COPD exacerbation (1/6-1/9). PT has recommended SNF placement. He is declining SNF placement but his primary team is concerned that he does not have capacity to refuse placement. He has had multiple falls and was recently found down for approximately 3 days. He ambulates with a scooter for mobility. He has poor insight about his medical condition and high risk for recurrent falls. He is unable to understand the need for assistance at home. His hospital course has been complicated by acute metabolic encephalopathy. His mental status has been noted to wax and wane.   On interview, Patrick Holt reports that he was admitted to the hospital for falling because he could not get up off the floor. When asked about his current treatment, he was unable to state that he is receiving treatment for COPD exacerbation. He denies problems with breathing although he reported that he is doing fine now. He is able to later report that he has COPD and his oxygen level is usually in the upper 80s to lower 90s. He perseverates about a rash that developed since hospitalization. He reports a history of recurrent falls. He attributes these falls to tripping on his rugs at home. He reports that they have been removed by his paid caregiver. He reports that he does need assistance with certain ADLs by his caregivers. He no longer cooks on the stove since he is forgetful and is afraid of causing a fire. He also sits in the shower while bathing to prevent falls. He reports readiness to discharge from the hospital due to financial stressors and inability to afford further hospitalization. He declines SNF placement due to a prior poor experience. He also feels like he has adequate help with  home health. Of note, he was assessed for capacity on 01/18/17 and deemed to have capacity to refuse SNF placement. He was deemed to lack capacity on 1/19 although his mental status has fluctuated during this hospitalization.   Patrick Holt denies current depression or anxiety. He denies a history of mental health although there is a documented history of bipolar disorder. He denies AVH, SI or HI. He denies current somatic complaints.   Past Psychiatric History: Bipolar disorder and schizoaffective disorder  Risk to Self: Is patient at risk for suicide?: No Risk to Others:  None. Denies HI.  Prior Inpatient Therapy:  Denies  Prior Outpatient Therapy:  Denies   Past Medical History:  Past Medical History:  Diagnosis Date  . Anemia   . Anxiety   . Asthma   . Atrial fibrillation ([place]) 01/2012   with RVR  . Bipolar 1 disorder ([place])   . Cancer ([place])   . Collagen vascular disease ([place])   . Conversion disorder 06/2010  . COPD (chronic obstructive pulmonary disease) ([place])   . Dermatomyositis ([place])   . Dermatomyositis ([place])   . Diverticulitis   . Diverticulosis of colon   . Dysrhythmia    "irregular" (11/15/2012)  . Esophageal dysmotility   . Esophageal stricture   . Fibromyalgia   . Gastritis   . GERD (gastroesophageal reflux disease)   . Hand fracture, right    sept 13, 2017, still wearing splint as og 01-10-2017  . Headache(784.0)    "severe; get them often" (11/15/2012)  . Hiatal hernia   . History of narcotic addiction ([place])   . Hx of adenomatous  colonic polyps   . Hyperlipidemia   . Hypertension   . Internal hemorrhoids   . Ischemic heart disease   . Major depression    with acute psychotic break in 06/2010  . Myocardial infarct ([place])    mulitple (1999, 2000, 2004)  . Narcotic dependence ([place])   . Nephrolithiasis   . Obesity   . OCD (obsessive compulsive disorder)   . Otosclerosis   . Paroxysmal A-fib ([place])   . Peripheral neuropathy   . Raynaud's disease   . Renal  insufficiency   . Rheumatoid arthritis(714.0)   . Sarcoidosis   . Seizures ([place])   . Subarachnoid hemorrhage ([place]) 01/2012   with subdural  hematoma.   . Type II diabetes mellitus ([place])   . Urge incontinence   . Vertigo     Past Surgical History:  Procedure Laterality Date  . BACK SURGERY    . CARDIAC CATHETERIZATION    . CARPAL TUNNEL RELEASE Bilateral   . CATARACT EXTRACTION W/ INTRAOCULAR LENS IMPLANT Left   . COLONOSCOPY N/A 09/27/2012   Procedure: COLONOSCOPY;  Surgeon: Lafayette Dragon, MD;  Location: [facility];  Service: Endoscopy;  Laterality: N/A;  . ESOPHAGOGASTRODUODENOSCOPY N/A 09/27/2012   Procedure: ESOPHAGOGASTRODUODENOSCOPY (EGD);  Surgeon: Lafayette Dragon, MD;  Location: [facility];  Service: Endoscopy;  Laterality: N/A;  . KNEE ARTHROSCOPY W/ MENISCAL REPAIR Left 2009  . LUMBAR DISC SURGERY    . LYMPH NODE DISSECTION Right 1970's   "neck; dr thought I had Hodgkins; turned out to be sarcoidosis" (11/15/2012)  . squamous papilloma   2010   removed by Dr. Constance Holster ENT, noted on tongue  . TONSILLECTOMY     Family History:  Family History  Problem Relation Age of Onset  . Alcohol abuse Mother   . Depression Mother   . Heart disease Mother   . Diabetes Mother   . Stroke Mother   . Coronary artery disease Father   . Diabetes Other        1/2 brother  . Hepatitis Brother    Family Psychiatric  History: Brother-bipolar disorder Social History:  Social History   Substance and Sexual Activity  Alcohol Use No  . Alcohol/week: 0.0 oz   Comment: Alcohol stopped in September of 2014     Social History   Substance and Sexual Activity  Drug Use No    Social History   Socioeconomic History  . Marital status: Divorced    Spouse name: None  . Number of children: None  . Years of education: None  . Highest education level: None  Social Needs  . Financial resource strain: None  . Food insecurity - worry: None  . Food insecurity - inability: None  .  Transportation needs - medical: None  . Transportation needs - non-medical: None  Occupational History  . Occupation: disabled  Tobacco Use  . Smoking status: Former Smoker    Packs/day: 0.50    Years: 30.00    Pack years: 15.00    Types: Cigarettes    Last attempt to quit: 12/15/2014    Years since quitting: 2.2  . Smokeless tobacco: Never Used  . Tobacco comment: 3 cigs a day  Substance and Sexual Activity  . Alcohol use: No    Alcohol/week: 0.0 oz    Comment: Alcohol stopped in September of 2014  . Drug use: No  . Sexual activity: No  Other Topics Concern  . None  Social History Narrative   The patient was born  and raised by both his biological parents in the [place] area. His parents did separate in the past but never divorced. He denies any history of any physical or sexual abuse. The patient says he dropped out of school in the 12th grade. 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 He has never been married and has no children. He currently lives alone in a duplex in [place].   Additional Social History: He previously resided at a senior living facility.     Allergies:   Allergies  Allergen Reactions  . Immune Globulins Other (See Comments)    Acute renal failure  . Ciprofloxacin Swelling  . Flagyl [Metronidazole] Swelling  . Lisinopril Diarrhea  . Sulfa Antibiotics Other (See Comments)    blisters  . Cefepime     Diffuse maculopapular rash when receiving both vanc and cefepime, timing unclear.  . Morphine And Related     Does not help with pain  . Requip [Ropinirole Hcl]     constipation  . Vancomycin     Diffuse maculopapular rash when receiving both vanc/cefepime.  Timing unclear.    . Betamethasone Dipropionate Other (See Comments)    Unknown  . Bupropion Hcl Other (See Comments)    Unknown  . Clobetasol Other (See Comments)    Unknown  . Codeine Other (See Comments)     Does not help with pain  . Escitalopram Oxalate Other (See Comments)    Unknown  . Fluoxetine Hcl Other (See Comments)    Unknown  . Meclizine Rash  . Paroxetine Other (See Comments)    Unknown  . Penicillins Other (See Comments)    Unknown reaction Has patient had a PCN reaction causing immediate rash, facial/tongue/throat swelling, SOB or lightheadedness with hypotension: unknown Has patient had a PCN reaction causing severe rash involving mucus membranes or skin necrosis: unknown Has patient had a PCN reaction that required hospitalization unknown Has patient had a PCN reaction occurring within the last 10 years: unknown If all of the above answers are "NO", then may proceed with Cephalosporin use. Unknown Unknown reactio  . Tacrolimus Other (See Comments)    Unknown  . Tetanus Toxoid Other (See Comments)    Unknown  . Tuberculin Purified Protein Derivative Other (See Comments)    Unknown    Labs:  Results for orders placed or performed during the hospital encounter of 03/03/17 (from the past 48 hour(s))  MRSA PCR Screening     Status: None   Collection Time: 03/05/17 12:31 PM  Result Value Ref Range   MRSA by PCR NEGATIVE NEGATIVE    Comment:        The GeneXpert MRSA Assay (FDA approved for NASAL specimens only), is one component of a comprehensive MRSA colonization surveillance program. It is not intended to diagnose MRSA infection nor to guide or monitor treatment for MRSA infections.   Respiratory Panel by PCR     Status: None   Collection Time: 03/05/17  5:13 PM  Result Value Ref Range   Adenovirus NOT DETECTED NOT DETECTED   Coronavirus 229E NOT DETECTED NOT DETECTED   Coronavirus HKU1 NOT DETECTED NOT DETECTED   Coronavirus NL63 NOT DETECTED NOT DETECTED   Coronavirus OC43 NOT DETECTED NOT DETECTED   Metapneumovirus NOT DETECTED NOT DETECTED   Rhinovirus / Enterovirus NOT DETECTED NOT DETECTED   Influenza A NOT DETECTED NOT DETECTED   Influenza B NOT  DETECTED NOT  DETECTED   Parainfluenza Virus 1 NOT DETECTED NOT DETECTED   Parainfluenza Virus 2 NOT DETECTED NOT DETECTED   Parainfluenza Virus 3 NOT DETECTED NOT DETECTED   Parainfluenza Virus 4 NOT DETECTED NOT DETECTED   Respiratory Syncytial Virus NOT DETECTED NOT DETECTED   Bordetella pertussis NOT DETECTED NOT DETECTED   Chlamydophila pneumoniae NOT DETECTED NOT DETECTED   Mycoplasma pneumoniae NOT DETECTED NOT DETECTED    Comment: Performed at [facility], [place] [address], [place], [place] [address]  Procalcitonin     Status: None   Collection Time: 03/06/17  5:11 AM  Result Value Ref Range   Procalcitonin <0.10 ng/mL    Comment:        Interpretation: PCT (Procalcitonin) <= 0.5 ng/mL: Systemic infection (sepsis) is not likely. Local bacterial infection is possible. (NOTE)       Sepsis PCT Algorithm           Lower Respiratory Tract                                      Infection PCT Algorithm    ----------------------------     ----------------------------         PCT < 0.25 ng/mL                PCT < 0.10 ng/mL         Strongly encourage             Strongly discourage   discontinuation of antibiotics    initiation of antibiotics    ----------------------------     -----------------------------       PCT 0.25 - 0.50 ng/mL            PCT 0.10 - 0.25 ng/mL               OR       >80% decrease in PCT            Discourage initiation of                                            antibiotics      Encourage discontinuation           of antibiotics    ----------------------------     -----------------------------         PCT >= 0.50 ng/mL              PCT 0.26 - 0.50 ng/mL               AND        <80% decrease in PCT             Encourage initiation of                                             antibiotics       Encourage continuation           of antibiotics    ----------------------------     -----------------------------        PCT >= 0.50 ng/mL  PCT > 0.50 ng/mL               AND         increase in PCT                  Strongly encourage                                      initiation of antibiotics    Strongly encourage escalation           of antibiotics                                     -----------------------------                                           PCT <= 0.25 ng/mL                                                 OR                                        > 80% decrease in PCT                                     Discontinue / Do not initiate                                             antibiotics   CBC     Status: Abnormal   Collection Time: 03/06/17  5:11 AM  Result Value Ref Range   WBC 7.9 4.0 - 10.5 K/uL   RBC 4.84 4.22 - 5.81 MIL/uL   Hemoglobin 13.0 13.0 - 17.0 g/dL   HCT 43.1 39.0 - 52.0 %   MCV 89.0 78.0 - 100.0 fL   MCH 26.9 26.0 - 34.0 pg   MCHC 30.2 30.0 - 36.0 g/dL   RDW 21.3 (H) 11.5 - 15.5 %   Platelets 208 150 - 400 K/uL  Basic metabolic panel     Status: Abnormal   Collection Time: 03/06/17  5:11 AM  Result Value Ref Range   Sodium 139 135 - 145 mmol/L   Potassium 3.8 3.5 - 5.1 mmol/L   Chloride 103 101 - 111 mmol/L   CO2 30 22 - 32 mmol/L   Glucose, Bld 89 65 - 99 mg/dL   BUN 10 6 - 20 mg/dL   Creatinine, Ser 0.99 0.61 - 1.24 mg/dL   Calcium 8.0 (L) 8.9 - 10.3 mg/dL   GFR calc non Af Amer >60 >60 mL/min   GFR calc Af Amer >60 >60 mL/min    Comment: (NOTE) The eGFR has been calculated using the CKD EPI equation. This calculation has not been validated in all clinical situations. eGFR's persistently <  60 mL/min signify possible Chronic Kidney Disease.    Anion gap 6 5 - 15  TSH     Status: None   Collection Time: 03/06/17  4:13 PM  Result Value Ref Range   TSH 0.985 0.350 - 4.500 uIU/mL    Comment: Performed by a 3rd Generation assay with a functional sensitivity of <=0.01 uIU/mL.  Procalcitonin     Status: None   Collection Time: 03/07/17  5:40 AM  Result Value Ref Range    Procalcitonin <0.10 ng/mL    Comment:        Interpretation: PCT (Procalcitonin) <= 0.5 ng/mL: Systemic infection (sepsis) is not likely. Local bacterial infection is possible. (NOTE)       Sepsis PCT Algorithm           Lower Respiratory Tract                                      Infection PCT Algorithm    ----------------------------     ----------------------------         PCT < 0.25 ng/mL                PCT < 0.10 ng/mL         Strongly encourage             Strongly discourage   discontinuation of antibiotics    initiation of antibiotics    ----------------------------     -----------------------------       PCT 0.25 - 0.50 ng/mL            PCT 0.10 - 0.25 ng/mL               OR       >80% decrease in PCT            Discourage initiation of                                            antibiotics      Encourage discontinuation           of antibiotics    ----------------------------     -----------------------------         PCT >= 0.50 ng/mL              PCT 0.26 - 0.50 ng/mL               AND        <80% decrease in PCT             Encourage initiation of                                             antibiotics       Encourage continuation           of antibiotics    ----------------------------     -----------------------------        PCT >= 0.50 ng/mL                  PCT > 0.50 ng/mL               AND  increase in PCT                  Strongly encourage                                      initiation of antibiotics    Strongly encourage escalation           of antibiotics                                     -----------------------------                                           PCT <= 0.25 ng/mL                                                 OR                                        > 80% decrease in PCT                                     Discontinue / Do not initiate                                             antibiotics   CBC     Status: Abnormal    Collection Time: 03/07/17  5:40 AM  Result Value Ref Range   WBC 7.1 4.0 - 10.5 K/uL   RBC 4.82 4.22 - 5.81 MIL/uL   Hemoglobin 13.0 13.0 - 17.0 g/dL   HCT 42.4 39.0 - 52.0 %   MCV 88.0 78.0 - 100.0 fL   MCH 27.0 26.0 - 34.0 pg   MCHC 30.7 30.0 - 36.0 g/dL   RDW 20.6 (H) 11.5 - 15.5 %   Platelets 222 150 - 400 K/uL  Comprehensive metabolic panel     Status: Abnormal   Collection Time: 03/07/17  5:40 AM  Result Value Ref Range   Sodium 141 135 - 145 mmol/L   Potassium 4.5 3.5 - 5.1 mmol/L   Chloride 103 101 - 111 mmol/L   CO2 33 (H) 22 - 32 mmol/L   Glucose, Bld 141 (H) 65 - 99 mg/dL   BUN 14 6 - 20 mg/dL   Creatinine, Ser 0.83 0.61 - 1.24 mg/dL   Calcium 8.5 (L) 8.9 - 10.3 mg/dL   Total Protein 6.0 (L) 6.5 - 8.1 g/dL   Albumin 2.7 (L) 3.5 - 5.0 g/dL   AST 17 15 - 41 U/L   ALT 24 17 - 63 U/L   Alkaline Phosphatase 67 38 - 126 U/L   Total Bilirubin 0.7 0.3 - 1.2 mg/dL   GFR calc non Af Amer >60 >60 mL/min   GFR calc Af Amer >60 >  60 mL/min    Comment: (NOTE) The eGFR has been calculated using the CKD EPI equation. This calculation has not been validated in all clinical situations. eGFR's persistently <60 mL/min signify possible Chronic Kidney Disease.    Anion gap 5 5 - 15    Current Facility-Administered Medications  Medication Dose Route Frequency Provider Last Rate Last Dose  . ALPRAZolam Duanne Moron) tablet 1 mg  1 mg Oral TID PRN Fuller Plan A, MD      . amiodarone (PACERONE) tablet 200 mg  200 mg Oral Daily Smith, Rondell A, MD   200 mg at 03/07/17 1000  . arformoterol (BROVANA) nebulizer solution 15 mcg  15 mcg Nebulization BID Fuller Plan A, MD   15 mcg at 03/05/17 0803  . aspirin EC tablet 81 mg  81 mg Oral Daily Smith, Rondell A, MD   81 mg at 03/07/17 1001  . atorvastatin (LIPITOR) tablet 40 mg  40 mg Oral Daily Smith, Rondell A, MD   40 mg at 03/07/17 1000  . budesonide (PULMICORT) nebulizer solution 0.5 mg  0.5 mg Nebulization BID Fuller Plan A, MD   0.5 mg  at 03/05/17 0803  . diphenhydrAMINE (BENADRYL) capsule 25 mg  25 mg Oral Q6H PRN Elodia Florence., MD      . divalproex (DEPAKOTE ER) 24 hr tablet 750 mg  750 mg Oral Daily Elodia Florence., MD   750 mg at 03/07/17 1000  . enoxaparin (LOVENOX) injection 40 mg  40 mg Subcutaneous QHS Smith, Rondell A, MD   40 mg at 03/06/17 2118  . furosemide (LASIX) injection 20 mg  20 mg Intravenous BID Elodia Florence., MD   20 mg at 03/07/17 0840  . guaiFENesin (MUCINEX) 12 hr tablet 600 mg  600 mg Oral BID Tamala Julian, Rondell A, MD   600 mg at 03/07/17 1000  . ipratropium-albuterol (DUONEB) 0.5-2.5 (3) MG/3ML nebulizer solution 3 mL  3 mL Nebulization Once Little, Wenda Overland, MD      . ipratropium-albuterol (DUONEB) 0.5-2.5 (3) MG/3ML nebulizer solution 3 mL  3 mL Nebulization Q4H PRN Smith, Rondell A, MD      . ipratropium-albuterol (DUONEB) 0.5-2.5 (3) MG/3ML nebulizer solution 3 mL  3 mL Inhalation TID Fuller Plan A, MD   3 mL at 03/05/17 0803  . levETIRAcetam (KEPPRA) tablet 500 mg  500 mg Oral BID Fuller Plan A, MD   500 mg at 03/07/17 1000  . ondansetron (ZOFRAN) tablet 4 mg  4 mg Oral Q6H PRN Fuller Plan A, MD       Or  . ondansetron (ZOFRAN) injection 4 mg  4 mg Intravenous Q6H PRN Smith, Rondell A, MD      . pantoprazole (PROTONIX) EC tablet 40 mg  40 mg Oral Daily Smith, Rondell A, MD   40 mg at 03/07/17 1001  . potassium chloride SA (K-DUR,KLOR-CON) CR tablet 20 mEq  20 mEq Oral Daily Tamala Julian, Rondell A, MD   20 mEq at 03/07/17 1000  . predniSONE (DELTASONE) tablet 40 mg  40 mg Oral Q breakfast Elodia Florence., MD   40 mg at 03/07/17 0840  . traMADol (ULTRAM) tablet 50 mg  50 mg Oral Q6H PRN Fuller Plan A, MD   50 mg at 03/05/17 1310    Musculoskeletal: Strength & Muscle Tone: decreased due to physical deconditioning.  Gait & Station: UTA since patient was lying in be. Patient leans: N/A  Psychiatric Specialty Exam: Physical Exam  Nursing note  and vitals  reviewed. Constitutional: He is oriented to person, place, and time. He appears well-developed and well-nourished.  HENT:  Head: Normocephalic and atraumatic.  Neck: Normal range of motion.  Respiratory: Effort normal.  Musculoskeletal: Normal range of motion.  Neurological: He is alert and oriented to person, place, and time.  Skin: No rash noted.  Psychiatric: His speech is normal and behavior is normal. Thought content normal. His affect is labile. He expresses impulsivity.    Review of Systems  Constitutional: Negative for chills and fever.  Gastrointestinal: Negative for nausea and vomiting.  Psychiatric/Behavioral: Negative for depression, hallucinations, substance abuse and suicidal ideas. The patient is not nervous/anxious.   All other systems reviewed and are negative.   Blood pressure 136/61, pulse 62, temperature 98.9 F (37.2 C), temperature source Oral, resp. rate 18, height '5\' 3"'  (1.6 m), weight 100 kg (220 lb 6.4 oz), SpO2 96 %.Body mass index is 39.04 kg/m.  General Appearance: Fairly Groomed, elderly, Caucasian male who is wearing a hospital gown with a rash on his left forearm and lying in bed. NAD.   Eye Contact:  Good  Speech:  Clear and Coherent and Normal Rate  Volume:  Normal  Mood:  "I'm fine."  Affect:  Labile  Thought Process:  Goal Directed and Linear  Orientation:  Full (Time, Place, and Person)  Thought Content:  Logical  Suicidal Thoughts:  No  Homicidal Thoughts:  No  Memory:  Immediate;   Fair Recent;   Fair Remote;   Fair  Judgement:  Fair  Insight:  Fair  Psychomotor Activity:  Normal  Concentration:  Concentration: Good and Attention Span: Good  Recall:  [employer] of Knowledge:  Fair  Language:  Fair  Akathisia:  No  Handed:  Right  AIMS (if indicated):   N/A  Assets:  Communication Skills Housing  ADL's:  Impaired  Cognition:  WNL  Sleep:   N/A   Assessment:  Patrick Holt is a 67 y.o. male who was admitted with acute  respiratory failure with hypoxia. Psychiatry was consulted for capacity to refuse SNF placement. Patient demonstrates capacity to refuse SNF placement. He understands the risk of falling at home and has made lifestyle modifications to reduce falls. He prefers not live at a SNF due to a prior poor experience and is amendable to continuing home health services for his current needs.   Treatment Plan Summary: -Patient demonstrates capacity to refuse SNF placement. Please see assessment for further details.  -Psychiatry will sign off patient at this time. Please consult psychiatry again as needed.    Disposition: No evidence of imminent risk to self or others at present.   Patient does not meet criteria for psychiatric inpatient admission.  Faythe Dingwall, DO 03/07/2017 11:36 AM

## 2017-03-07 NOTE — Progress Notes (Signed)
PT Cancellation Note  Patient Details Name: Patrick Holt MRN: [id number] DOB: Nov 14, 1950   Cancelled Treatment:    Reason Eval/Treat Not Completed: Patient declined, no reason specified Pt refused mobility.  States he has a w/c, RW and 2 canes at home.  Plans to go home by ambulance.  Pt adamantly declined to participate at this time despite encouragement.   Erica Richwine,KATHrine E 03/07/2017, 1:36 PM [name], PT, DPT 03/07/2017 Pager: [phone]

## 2017-03-07 NOTE — Care Management Note (Signed)
Case Management Note  Patient Details  Name: Patrick Holt MRN: [id number] Date of Birth: Sep 11, 1950  Subjective/Objective: Psych cons-await recc. CM/CSW following.                   Action/Plan:d/c plan SNF.   Expected Discharge Date:  (unknown)               Expected Discharge Plan:  Skilled Nursing Facility  In-House Referral:  Clinical Social Work  Discharge planning Services  CM Consult  Post Acute Care Choice:    Choice offered to:  Patient  DME Arranged:    DME Agency:     HH Arranged:    [place] Agency:     Status of Service:  In process, will continue to follow  If discussed at Long Length of Stay Meetings, dates discussed:    Additional Comments:  Dessa Phi, RN 03/07/2017, 12:08 PM

## 2017-03-07 NOTE — Progress Notes (Signed)
Pt ambulated 200 ft in hallway without oxygen.  Resting sat was 92% on RA and sat remained 90-91% on RA during ambulation.  No complaints of SOB.

## 2017-03-08 LAB — CULTURE, BLOOD (ROUTINE X 2)
Culture: NO GROWTH
Culture: NO GROWTH
SPECIAL REQUESTS: ADEQUATE
Special Requests: ADEQUATE

## 2017-04-06 ENCOUNTER — Emergency Department (HOSPITAL_COMMUNITY): Payer: Medicare Other

## 2017-04-06 ENCOUNTER — Encounter (HOSPITAL_COMMUNITY): Payer: Self-pay | Admitting: Emergency Medicine

## 2017-04-06 ENCOUNTER — Emergency Department (HOSPITAL_COMMUNITY)
Admission: EM | Admit: 2017-04-06 | Discharge: 2017-04-06 | Disposition: A | Discharge status: Home / Self Care | Payer: Medicare Other | Attending: Emergency Medicine | Admitting: Emergency Medicine

## 2017-04-06 DIAGNOSIS — Z8669 Personal history of other diseases of the nervous system and sense organs: Secondary | ICD-10-CM

## 2017-04-06 DIAGNOSIS — Y998 Other external cause status: Secondary | ICD-10-CM | POA: Diagnosis not present

## 2017-04-06 DIAGNOSIS — K5730 Diverticulosis of large intestine without perforation or abscess without bleeding: Secondary | ICD-10-CM | POA: Insufficient documentation

## 2017-04-06 DIAGNOSIS — F250 Schizoaffective disorder, bipolar type: Secondary | ICD-10-CM | POA: Diagnosis present

## 2017-04-06 DIAGNOSIS — R296 Repeated falls: Secondary | ICD-10-CM

## 2017-04-06 DIAGNOSIS — I10 Essential (primary) hypertension: Secondary | ICD-10-CM | POA: Insufficient documentation

## 2017-04-06 DIAGNOSIS — M549 Dorsalgia, unspecified: Secondary | ICD-10-CM | POA: Diagnosis not present

## 2017-04-06 DIAGNOSIS — Z7982 Long term (current) use of aspirin: Secondary | ICD-10-CM | POA: Diagnosis not present

## 2017-04-06 DIAGNOSIS — J410 Simple chronic bronchitis: Secondary | ICD-10-CM

## 2017-04-06 DIAGNOSIS — Z79899 Other long term (current) drug therapy: Secondary | ICD-10-CM | POA: Diagnosis not present

## 2017-04-06 DIAGNOSIS — Z87891 Personal history of nicotine dependence: Secondary | ICD-10-CM | POA: Diagnosis not present

## 2017-04-06 DIAGNOSIS — E785 Hyperlipidemia, unspecified: Secondary | ICD-10-CM | POA: Diagnosis not present

## 2017-04-06 DIAGNOSIS — R0602 Shortness of breath: Secondary | ICD-10-CM | POA: Insufficient documentation

## 2017-04-06 DIAGNOSIS — Y939 Activity, unspecified: Secondary | ICD-10-CM | POA: Diagnosis not present

## 2017-04-06 DIAGNOSIS — T1490XA Injury, unspecified, initial encounter: Secondary | ICD-10-CM

## 2017-04-06 DIAGNOSIS — Y929 Unspecified place or not applicable: Secondary | ICD-10-CM | POA: Insufficient documentation

## 2017-04-06 DIAGNOSIS — S2241XA Multiple fractures of ribs, right side, initial encounter for closed fracture: Secondary | ICD-10-CM

## 2017-04-06 DIAGNOSIS — J449 Chronic obstructive pulmonary disease, unspecified: Secondary | ICD-10-CM | POA: Diagnosis present

## 2017-04-06 DIAGNOSIS — S32019D Unspecified fracture of first lumbar vertebra, subsequent encounter for fracture with routine healing: Secondary | ICD-10-CM | POA: Diagnosis not present

## 2017-04-06 DIAGNOSIS — I480 Paroxysmal atrial fibrillation: Secondary | ICD-10-CM | POA: Diagnosis not present

## 2017-04-06 DIAGNOSIS — E119 Type 2 diabetes mellitus without complications: Secondary | ICD-10-CM | POA: Diagnosis not present

## 2017-04-06 DIAGNOSIS — S2249XA Multiple fractures of ribs, unspecified side, initial encounter for closed fracture: Secondary | ICD-10-CM

## 2017-04-06 DIAGNOSIS — S32019A Unspecified fracture of first lumbar vertebra, initial encounter for closed fracture: Secondary | ICD-10-CM

## 2017-04-06 DIAGNOSIS — W19XXXA Unspecified fall, initial encounter: Secondary | ICD-10-CM | POA: Diagnosis not present

## 2017-04-06 DIAGNOSIS — I4891 Unspecified atrial fibrillation: Secondary | ICD-10-CM | POA: Diagnosis present

## 2017-04-06 LAB — URINALYSIS, ROUTINE W REFLEX MICROSCOPIC
BILIRUBIN URINE: NEGATIVE
Glucose, UA: NEGATIVE mg/dL
Hgb urine dipstick: NEGATIVE
KETONES UR: 5 mg/dL — AB
Leukocytes, UA: NEGATIVE
NITRITE: NEGATIVE
Protein, ur: NEGATIVE mg/dL
SPECIFIC GRAVITY, URINE: 1.041 — AB (ref 1.005–1.030)
pH: 7 (ref 5.0–8.0)

## 2017-04-06 LAB — BRAIN NATRIURETIC PEPTIDE: B Natriuretic Peptide: 55.2 pg/mL (ref 0.0–100.0)

## 2017-04-06 LAB — CBC WITH DIFFERENTIAL/PLATELET
BASOS ABS: 0 10*3/uL (ref 0.0–0.1)
Basophils Relative: 0 %
EOS PCT: 2 %
Eosinophils Absolute: 0.1 10*3/uL (ref 0.0–0.7)
HEMATOCRIT: 42.4 % (ref 39.0–52.0)
HEMOGLOBIN: 13.1 g/dL (ref 13.0–17.0)
LYMPHS PCT: 40 %
Lymphs Abs: 2.7 10*3/uL (ref 0.7–4.0)
MCH: 27.9 pg (ref 26.0–34.0)
MCHC: 30.9 g/dL (ref 30.0–36.0)
MCV: 90.4 fL (ref 78.0–100.0)
Monocytes Absolute: 0.7 10*3/uL (ref 0.1–1.0)
Monocytes Relative: 10 %
NEUTROS ABS: 3.2 10*3/uL (ref 1.7–7.7)
NEUTROS PCT: 48 %
PLATELETS: 240 10*3/uL (ref 150–400)
RBC: 4.69 MIL/uL (ref 4.22–5.81)
RDW: 20.5 % — ABNORMAL HIGH (ref 11.5–15.5)
WBC: 6.7 10*3/uL (ref 4.0–10.5)

## 2017-04-06 LAB — COMPREHENSIVE METABOLIC PANEL
ALK PHOS: 64 U/L (ref 38–126)
ALT: 8 U/L — AB (ref 17–63)
AST: 12 U/L — AB (ref 15–41)
Albumin: 2.9 g/dL — ABNORMAL LOW (ref 3.5–5.0)
Anion gap: 8 (ref 5–15)
BUN: 9 mg/dL (ref 6–20)
CHLORIDE: 106 mmol/L (ref 101–111)
CO2: 27 mmol/L (ref 22–32)
CREATININE: 1.01 mg/dL (ref 0.61–1.24)
Calcium: 8.5 mg/dL — ABNORMAL LOW (ref 8.9–10.3)
GFR calc Af Amer: >60 mL/min (ref >60)
Glucose, Bld: 92 mg/dL (ref 65–99)
Potassium: 4.5 mmol/L (ref 3.5–5.1)
Sodium: 141 mmol/L (ref 135–145)
Total Bilirubin: 0.5 mg/dL (ref 0.3–1.2)
Total Protein: 6 g/dL — ABNORMAL LOW (ref 6.5–8.1)

## 2017-04-06 LAB — INFLUENZA PANEL BY PCR (TYPE A & B)
INFLAPCR: NEGATIVE
INFLBPCR: NEGATIVE

## 2017-04-06 LAB — I-STAT TROPONIN, ED: TROPONIN I, POC: 0 ng/mL (ref 0.00–0.08)

## 2017-04-06 LAB — I-STAT CG4 LACTIC ACID, ED
LACTIC ACID, VENOUS: 0.6 mmol/L (ref 0.5–1.9)
Lactic Acid, Venous: 0.97 mmol/L (ref 0.5–1.9)

## 2017-04-06 LAB — CK: Total CK: 37 U/L — ABNORMAL LOW (ref 49–397)

## 2017-04-06 MED ORDER — IOPAMIDOL (ISOVUE-300) INJECTION 61%: 100.0000 mL | Freq: Once | INTRAVENOUS | Status: DC | PRN

## 2017-04-06 MED ORDER — HYDROMORPHONE HCL 1 MG/ML IJ SOLN
1.0000 mg | Freq: Once | INTRAMUSCULAR | Status: AC
  Administered 2017-04-06: 1 mg via INTRAVENOUS
  Filled 2017-04-06: qty 1

## 2017-04-06 MED ORDER — SODIUM CHLORIDE 0.9 % IV BOLUS (SEPSIS): 500.0000 mL | Freq: Once | INTRAVENOUS | Status: DC

## 2017-04-06 MED ORDER — IOPAMIDOL (ISOVUE-370) INJECTION 76%
100.0000 mL | Freq: Once | INTRAVENOUS | Status: AC | PRN
  Administered 2017-04-06: 100 mL via INTRAVENOUS

## 2017-04-06 MED ORDER — IOPAMIDOL (ISOVUE-300) INJECTION 61%
INTRAVENOUS | Status: AC
  Filled 2017-04-06: qty 100

## 2017-04-06 NOTE — ED Notes (Signed)
Pt placed on 2 liters n/c due to sats of 87% on room air , pt up to 96 % on 2 liters

## 2017-04-06 NOTE — ED Notes (Signed)
Pt wanting to know if he can eat or drink.

## 2017-04-06 NOTE — ED Notes (Signed)
Patient transported to CT 

## 2017-04-06 NOTE — Discharge Planning (Signed)
Per [facility] agency ([place]), pt is not safe at home as he lives alone without additional support.  APS report made by [facility].  [facility] will communicate concerns with EDP and staff.

## 2017-04-06 NOTE — ED Triage Notes (Signed)
Pt fell on Sunday hitting his lower back on the furniture pt has been able to  Get around since with his cane ,

## 2017-04-06 NOTE — ED Notes (Signed)
Gave pt a [place] sandwich and a sprite to eat

## 2017-04-06 NOTE — Progress Notes (Signed)
[facility]  Patient Status: Pt is [address] effective 03/06/2017 Lives alone; no caregiver; has been unable to meet his ADL needs for the past few days due to c/o pain in his back.   Active APS case:  Worker is Devoria Albe [phone]   [facility] is providing the following services: RN PT ST SW  If patient discharges after hours, please call [phone].   Cindie J Sillmon 04/06/2017, 12:15 PM  Please call [phone] with questions/concerns.

## 2017-04-06 NOTE — ED Notes (Addendum)
Pt refuses to wait for IV fluids, was told by consulting md that he was being discharged, states his ride is here and ambulated out of department without discharge paperwork.

## 2017-04-06 NOTE — Consult Note (Signed)
Medical Consultation  Patrick Holt  NGE:[id number]  DOB: 23-Jul-1950  DOA: 04/06/2017 PCP: [facility], [facility]   Requesting physician: Dr Jeneen Rinks Date of consultation: 04/06/17 Reason for consultation: evaluate for admission  Impression/Recommendations Principal Problem:   L1 and L2 vertebral fracture/ Frequent falls - fell about 3 days ago  - currently no tenderness or pain noted in lumbar spine- he is ambulating in the ED room without any assistance - he is asking to go home and I see no reason to admit him if he is not in pain and able to ambulate without assistance- he has home health PT and can continue this for his issues with frequent falls  Active Problems:   Multiple rib fractures/ acute hypoxic resp failure - he has fracture the right 11/12th ribs however has no pain - pulse ox on my exam is 95-98% on room air while standing up and while sitting up in a chair  Dehydration - d/c lasix - will give a 500 cc normal saline Bolus prior to discharge home    COPD (chronic obstructive pulmonary disease)  -cont Duonebs and Symbicort    History of seizure disorder - cont Depakote and Keppra    Atrial fibrillation  - CHA2DS2-VASc Score 1 - cont Amiodarone - not on anticoagulation due to falls  GERD - cont PPI  Bipolar disorder - cont Risperdal   Chief Complaint: fall and back pain  HPI:  Patrick Holt is an 67 y.o. male  Patrick Holt is a 67 y.o. male with medical history of A-fib not on anticoagulation, COPD, Bipolar disorder who comes in for back pain after a mechanical fall 3 days ago. He is also found to be hypoxic with a pulse ox of 87% on initial eval. Imaging in the ER shows L1/L2 transverse process fractures and right lower rib fracture.  When I walked into the room, the patient was walking on his own. He states the only pain that he has is in the sides of his lower back  and is not severe. He is not having any chest/rib pain. He is asking to go home.   Review of Systems  All ROS negative.    Past Medical History:  Diagnosis Date  . Anemia   . Anxiety   . Asthma   . Atrial fibrillation ([place]) 01/2012   with RVR  . Bipolar 1 disorder ([place])   . Cancer ([place])   . Collagen vascular disease ([place])   . Conversion disorder 06/2010  . COPD (chronic obstructive pulmonary disease) ([place])   . Dermatomyositis ([place])   . Dermatomyositis ([place])   . Diverticulitis   . Diverticulosis of colon   . Dysrhythmia    "irregular" (11/15/2012)  . Esophageal dysmotility   . Esophageal stricture   . Fibromyalgia   . Gastritis   . GERD (gastroesophageal reflux disease)   . Hand fracture, right    sept 13, 2017, still wearing splint as og 01-10-2017  . Headache(784.0)    "severe; get them often" (11/15/2012)  . Hiatal hernia   . History of narcotic addiction ([place])   . Hx of adenomatous colonic polyps   . Hyperlipidemia   . Hypertension   . Internal hemorrhoids   . Ischemic heart disease   . Major depression    with acute psychotic break in 06/2010  . Myocardial infarct ([place])    mulitple (1999, 2000, 2004)  . Narcotic dependence ([place])   . Nephrolithiasis   . Obesity   .  OCD (obsessive compulsive disorder)   . Otosclerosis   . Paroxysmal A-fib ([place])   . Peripheral neuropathy   . Raynaud's disease   . Renal insufficiency   . Rheumatoid arthritis(714.0)   . Sarcoidosis   . Seizures ([place])   . Subarachnoid hemorrhage ([place]) 01/2012   with subdural  hematoma.   . Type II diabetes mellitus ([place])   . Urge incontinence   . Vertigo     Past Surgical History:  Procedure Laterality Date  . BACK SURGERY    . CARDIAC CATHETERIZATION    . CARPAL TUNNEL RELEASE Bilateral   . CATARACT EXTRACTION W/ INTRAOCULAR LENS IMPLANT Left   . COLONOSCOPY N/A 09/27/2012   Procedure: COLONOSCOPY;  Surgeon: Lafayette Dragon, MD;  Location: [facility];  Service: Endoscopy;  Laterality:  N/A;  . ESOPHAGOGASTRODUODENOSCOPY N/A 09/27/2012   Procedure: ESOPHAGOGASTRODUODENOSCOPY (EGD);  Surgeon: Lafayette Dragon, MD;  Location: [facility];  Service: Endoscopy;  Laterality: N/A;  . KNEE ARTHROSCOPY W/ MENISCAL REPAIR Left 2009  . LUMBAR DISC SURGERY    . LYMPH NODE DISSECTION Right 1970's   "neck; dr thought I had Hodgkins; turned out to be sarcoidosis" (11/15/2012)  . squamous papilloma   2010   removed by Dr. Constance Holster ENT, noted on tongue  . TONSILLECTOMY      Social History:  reports that he quit smoking about 2 years ago. His smoking use included cigarettes. He has a 15.00 pack-year smoking history. he has never used smokeless tobacco. He reports that he does not drink alcohol or use drugs.  Allergies  Allergen Reactions  . Immune Globulins Other (See Comments)    Acute renal failure  . Ciprofloxacin Swelling  . Flagyl [Metronidazole] Swelling  . Lisinopril Diarrhea  . Sulfa Antibiotics Other (See Comments)    blisters  . Cefepime     Diffuse maculopapular rash when receiving both vanc and cefepime, timing unclear.  . Morphine And Related     Does not help with pain  . Requip [Ropinirole Hcl]     constipation  . Vancomycin     Diffuse maculopapular rash when receiving both vanc/cefepime.  Timing unclear.    . Betamethasone Dipropionate Other (See Comments)    Unknown  . Bupropion Hcl Other (See Comments)    Unknown  . Clobetasol Other (See Comments)    Unknown  . Codeine Other (See Comments)    Does not help with pain  . Escitalopram Oxalate Other (See Comments)    Unknown  . Fluoxetine Hcl Other (See Comments)    Unknown  . Meclizine Rash  . Paroxetine Other (See Comments)    Unknown  . Penicillins Other (See Comments)    Unknown reaction Has patient had a PCN reaction causing immediate rash, facial/tongue/throat swelling, SOB or lightheadedness with hypotension: unknown Has patient had a PCN reaction causing severe rash involving mucus membranes or skin  necrosis: unknown Has patient had a PCN reaction that required hospitalization unknown Has patient had a PCN reaction occurring within the last 10 years: unknown If all of the above answers are "NO", then may proceed with Cephalosporin use. Unknown Unknown reactio  . Tacrolimus Other (See Comments)    Unknown  . Tetanus Toxoid Other (See Comments)    Unknown  . Tuberculin Purified Protein Derivative Other (See Comments)    Unknown    Family History  Problem Relation Age of Onset  . Alcohol abuse Mother   . Depression Mother   . Heart disease Mother   .  Diabetes Mother   . Stroke Mother   . Coronary artery disease Father   . Diabetes Other        1/2 brother  . Hepatitis Brother     Prior to Admission medications   Medication Sig Start Date End Date Taking? Authorizing Provider  ALPRAZolam Duanne Moron) 1 MG tablet Take 1 mg by mouth 3 (three) times daily. 01/10/17  Yes [provider]  amiodarone (PACERONE) 200 MG tablet Take 1 tablet (200 mg total) by mouth daily. 02/10/17  Yes Alma Friendly, MD  amLODipine (NORVASC) 5 MG tablet Take 1 tablet (5 mg total) by mouth daily. 01/19/17  Yes Velvet Bathe, MD  aspirin EC 81 MG tablet Take 1 tablet (81 mg total) by mouth daily. 10/17/15  Yes [name], MD  atorvastatin (LIPITOR) 40 MG tablet Take 1 tablet (40 mg total) by mouth daily. 01/19/17  Yes Velvet Bathe, MD  budesonide-formoterol [facility]) 160-4.5 MCG/ACT inhaler Inhale 2 puffs into the lungs 2 (two) times daily. 01/19/17  Yes Velvet Bathe, MD  divalproex (DEPAKOTE) 250 MG DR tablet Take 750 mg by mouth daily. 03/16/17  Yes [provider]  ergocalciferol (VITAMIN D2) 50000 units capsule Take 50,000 Units by mouth once a week.   Yes [provider]  esomeprazole (NEXIUM) 40 MG capsule Take 40 mg by mouth daily. 07/26/16  Yes [provider]  furosemide (LASIX) 20 MG tablet Take 1 tablet (20 mg total) by mouth daily. 02/23/17 02/23/18 Yes  Domenic Polite, MD  ibuprofen (ADVIL,MOTRIN) 200 MG tablet Take 200 mg by mouth every 6 (six) hours as needed for headache or moderate pain.   Yes [provider]  ipratropium-albuterol (DUONEB) 0.5-2.5 (3) MG/3ML SOLN Inhale 3 mLs into the lungs every 6 (six) hours as needed. Patient taking differently: Inhale 3 mLs into the lungs every 6 (six) hours as needed (for breathing).  11/17/15  Yes Elgergawy, Silver Huguenin, MD  levETIRAcetam (KEPPRA) 500 MG tablet Take 500 mg by mouth 2 (two) times daily.   Yes [provider]  loperamide (IMODIUM) 2 MG capsule Take 2 mg by mouth as needed for diarrhea or loose stools.   Yes [provider]  Multiple Vitamin (MULTIVITAMIN WITH MINERALS) TABS tablet Take 1 tablet by mouth daily. 10/17/15  Yes [name], MD  oxybutynin (DITROPAN) 5 MG tablet Take 15 mg by mouth daily.  07/26/16  Yes [provider]  potassium chloride 20 MEQ TBCR Take 20 mEq by mouth daily. 02/23/17  Yes Domenic Polite, MD  risperiDONE (RISPERDAL) 1 MG tablet Take 1 mg by mouth 2 (two) times daily. 03/25/17  Yes [provider]  traMADol (ULTRAM) 50 MG tablet Take 50 mg by mouth every 6 (six) hours as needed for moderate pain.   Yes [provider]    Physical Exam: Blood pressure (!) 115/95, pulse 73, temperature 97.6 F (36.4 C), temperature source Oral, resp. rate 18, SpO2 96 %. @VITALS2 @ There were no vitals filed for this visit. No intake or output data in the 24 hours ending 04/06/17 1818   Constitutional: Appears well-developed and well-nourished. No distress. HENT: Normocephalic. External right and left ear normal. Oropharynx is clear and moist.  Eyes: Conjunctivae and EOM are normal. PERRLA, no scleral icterus.  Neck: Normal ROM. Neck supple. No JVD. No tracheal deviation. No thyromegaly.  CVS: RRR, S1/S2 +, no murmurs, no gallops, no carotid bruit.  Pulmonary: Effort and breath sounds normal, no stridor, rhonchi, wheezes,  rales.  Abdominal: Soft. BS +,  no distension, tenderness, rebound or guarding.  Musculoskeletal: Normal range of motion. No edema and no tenderness. Specifically no tenderness at L1, L2 level or in right lower rib cage Neuro: Alert. Normal reflexes, muscle tone coordination. No cranial nerve deficit. Skin: Skin is warm and dry. No rash noted. Not diaphoretic. No erythema. No pallor.  Psychiatric: Normal mood and affect. Behavior, judgment, thought content normal.    Labs on Admission:  Basic Metabolic Panel: Recent Labs  Lab 04/06/17 1107  NA 141  K 4.5  CL 106  CO2 27  GLUCOSE 92  BUN 9  CREATININE 1.01  CALCIUM 8.5*   Liver Function Tests: Recent Labs  Lab 04/06/17 1107  AST 12*  ALT 8*  ALKPHOS 64  BILITOT 0.5  PROT 6.0*  ALBUMIN 2.9*   No results for input(s): LIPASE, AMYLASE in the last 168 hours. No results for input(s): AMMONIA in the last 168 hours. CBC: Recent Labs  Lab 04/06/17 1107  WBC 6.7  NEUTROABS 3.2  HGB 13.1  HCT 42.4  MCV 90.4  PLT 240   Cardiac Enzymes: Recent Labs  Lab 04/06/17 1433  CKTOTAL 37*   BNP: Invalid input(s): POCBNP CBG: No results for input(s): GLUCAP in the last 168 hours.  Radiological Exams on Admission: Dg Chest 2 View  Result Date: 04/06/2017 CLINICAL DATA:  Fall, lower back pain and shortness of breath, initial encounter. EXAM: CHEST  2 VIEW COMPARISON:  CT chest 03/05/2017 and chest radiograph 03/04/2017. FINDINGS: Trachea is midline. Heart size stable. Thoracic aorta is calcified. Mild bibasilar atelectasis. No airspace consolidation or pleural fluid. No pneumothorax. Osseous structures appear grossly intact. IMPRESSION: Bibasilar atelectasis. Electronically Signed   By: Lorin Picket M.D.   On: 04/06/2017 12:27   Dg Lumbar Spine Complete  Result Date: 04/06/2017 CLINICAL DATA:  Recent fall.  Low back pain EXAM: LUMBAR SPINE - COMPLETE 4+ VIEW COMPARISON:  Lumbar radiographs 01/26/2017 FINDINGS: Negative for  fracture Multilevel degenerative change with anterior osteophyte formation. Disc space narrowing most prominent L2-3. Grade 1 anterolisthesis L5-S1 unchanged. No pars defect identified Atherosclerotic aorta.  Constipation.  Mild scoliosis IMPRESSION: Lumbar degenerative changes.  Negative for fracture Constipation Electronically Signed   By: Franchot Gallo M.D.   On: 04/06/2017 12:26   Ct Angio Chest Pe W And/or Wo Contrast  Result Date: 04/06/2017 CLINICAL DATA:  Syncope. Blunt abdominal trauma after fall 3 days ago. EXAM: CT ANGIOGRAPHY CHEST CT ABDOMEN AND PELVIS WITH CONTRAST TECHNIQUE: Multidetector CT imaging of the chest was performed using the standard protocol during bolus administration of intravenous contrast. Multiplanar CT image reconstructions and MIPs were obtained to evaluate the vascular anatomy. Multidetector CT imaging of the abdomen and pelvis was performed using the standard protocol during bolus administration of intravenous contrast. CONTRAST:  187mL ISOVUE-370 IOPAMIDOL (ISOVUE-370) INJECTION 76% COMPARISON:  CT scans of March 05, 2017 and December 10, 2016. FINDINGS: CTA CHEST FINDINGS Cardiovascular: Satisfactory opacification of the pulmonary arteries to the segmental level. No evidence of pulmonary embolism. Normal heart size. No pericardial effusion. Atherosclerosis of thoracic aorta is noted without aneurysm formation. No pericardial effusion is noted. Mild coronary artery calcifications are noted. Mediastinum/Nodes: No enlarged mediastinal, hilar, or axillary lymph nodes. Thyroid gland, trachea, and esophagus demonstrate no significant findings. Lungs/Pleura: No pneumothorax is noted. Small right pleural effusion is noted with adjacent subsegmental atelectasis. Mild emphysematous disease is noted in the upper lobes bilaterally. Minimal left posterior basilar subsegmental atelectasis is noted. Musculoskeletal: No chest wall  abnormality. No acute or significant osseous findings.  Review of the MIP images confirms the above findings. CT ABDOMEN and PELVIS FINDINGS Hepatobiliary: No focal liver abnormality is seen. No gallstones, gallbladder wall thickening, or biliary dilatation. Pancreas: Unremarkable. No pancreatic ductal dilatation or surrounding inflammatory changes. Spleen: Normal in size without focal abnormality. Adrenals/Urinary Tract: Adrenal glands are unremarkable. Stable bilateral renal cysts are noted. Bilateral nonobstructive nephrolithiasis is noted. Right renal upper pole scarring is noted. No hydronephrosis or renal obstruction is noted. Urinary bladder is unremarkable. Stomach/Bowel: Stomach is within normal limits. Appendix appears normal. No evidence of bowel wall thickening, distention, or inflammatory changes. Sigmoid diverticulosis is noted. Vascular/Lymphatic: Aortic atherosclerosis. No enlarged abdominal or pelvic lymph nodes. Reproductive: Prostate is unremarkable. Other: No abdominal wall hernia or abnormality. No abdominopelvic ascites. Musculoskeletal: No acute or significant osseous findings. Review of the MIP images confirms the above findings. IMPRESSION: No definite evidence of pulmonary embolus. Coronary artery calcifications are noted suggesting coronary artery disease. Small right pleural effusion is noted with adjacent subsegmental atelectasis. Sigmoid diverticulosis is noted without inflammation. Bilateral nonobstructive nephrolithiasis. No evidence of traumatic injury seen in the abdomen or pelvis. Aortic Atherosclerosis (ICD10-I70.0) and Emphysema (ICD10-J43.9). Electronically Signed   By: Marijo Conception, M.D.   On: 04/06/2017 16:09   Ct Abdomen Pelvis W Contrast  Result Date: 04/06/2017 CLINICAL DATA:  Syncope. Blunt abdominal trauma after fall 3 days ago. EXAM: CT ANGIOGRAPHY CHEST CT ABDOMEN AND PELVIS WITH CONTRAST TECHNIQUE: Multidetector CT imaging of the chest was performed using the standard protocol during bolus administration of  intravenous contrast. Multiplanar CT image reconstructions and MIPs were obtained to evaluate the vascular anatomy. Multidetector CT imaging of the abdomen and pelvis was performed using the standard protocol during bolus administration of intravenous contrast. CONTRAST:  173mL ISOVUE-370 IOPAMIDOL (ISOVUE-370) INJECTION 76% COMPARISON:  CT scans of March 05, 2017 and December 10, 2016. FINDINGS: CTA CHEST FINDINGS Cardiovascular: Satisfactory opacification of the pulmonary arteries to the segmental level. No evidence of pulmonary embolism. Normal heart size. No pericardial effusion. Atherosclerosis of thoracic aorta is noted without aneurysm formation. No pericardial effusion is noted. Mild coronary artery calcifications are noted. Mediastinum/Nodes: No enlarged mediastinal, hilar, or axillary lymph nodes. Thyroid gland, trachea, and esophagus demonstrate no significant findings. Lungs/Pleura: No pneumothorax is noted. Small right pleural effusion is noted with adjacent subsegmental atelectasis. Mild emphysematous disease is noted in the upper lobes bilaterally. Minimal left posterior basilar subsegmental atelectasis is noted. Musculoskeletal: No chest wall abnormality. No acute or significant osseous findings. Review of the MIP images confirms the above findings. CT ABDOMEN and PELVIS FINDINGS Hepatobiliary: No focal liver abnormality is seen. No gallstones, gallbladder wall thickening, or biliary dilatation. Pancreas: Unremarkable. No pancreatic ductal dilatation or surrounding inflammatory changes. Spleen: Normal in size without focal abnormality. Adrenals/Urinary Tract: Adrenal glands are unremarkable. Stable bilateral renal cysts are noted. Bilateral nonobstructive nephrolithiasis is noted. Right renal upper pole scarring is noted. No hydronephrosis or renal obstruction is noted. Urinary bladder is unremarkable. Stomach/Bowel: Stomach is within normal limits. Appendix appears normal. No evidence of bowel wall  thickening, distention, or inflammatory changes. Sigmoid diverticulosis is noted. Vascular/Lymphatic: Aortic atherosclerosis. No enlarged abdominal or pelvic lymph nodes. Reproductive: Prostate is unremarkable. Other: No abdominal wall hernia or abnormality. No abdominopelvic ascites. Musculoskeletal: No acute or significant osseous findings. Review of the MIP images confirms the above findings. IMPRESSION: No definite evidence of pulmonary embolus. Coronary artery calcifications are noted suggesting coronary artery disease. Small right pleural effusion is noted  with adjacent subsegmental atelectasis. Sigmoid diverticulosis is noted without inflammation. Bilateral nonobstructive nephrolithiasis. No evidence of traumatic injury seen in the abdomen or pelvis. Aortic Atherosclerosis (ICD10-I70.0) and Emphysema (ICD10-J43.9). Electronically Signed   By: Marijo Conception, M.D.   On: 04/06/2017 16:09   Ct L-spine No Charge  Result Date: 04/06/2017 CLINICAL DATA:  Syncopal episode 3 days ago with fall.  Back pain. EXAM: CT LUMBAR SPINE WITHOUT CONTRAST TECHNIQUE: Multidetector CT imaging of the lumbar spine was performed without intravenous contrast administration. Multiplanar CT image reconstructions were also generated. COMPARISON:  Radiographs same date.  Abdominal CT 12/10/2016. FINDINGS: Images for this study were generated from the data acquired during CTs of the chest, abdomen and pelvis. Those studies are dictated separately. Segmentation: 5 lumbar type vertebral bodies. Alignment: Chronic degenerative anterolisthesis at L5-S1. Stable from previous study. Vertebrae: The bones are mildly demineralized. No acute vertebral body fractures are identified. There are nondisplaced fractures of the right L1 and L2 transverse processes. There are fractures of the right 11th and 12th ribs. There are chronic endplate degenerative changes throughout the spine. No evidence of pars defect. Paraspinal and other soft tissues: No  significant paraspinal hematoma. Retroperitoneal findings are described separately. Disc levels: L1-2: No significant findings. L2-3: Annular disc bulging with endplate osteophytes asymmetric to the right. Mild facet and ligamentous hypertrophy. These factors contribute to mild spinal stenosis and mild right foraminal narrowing. L3-4: Disc height is relatively preserved. Mild disc bulging, endplate osteophytes and facet/ligamentous hypertrophy. Resulting mild-to-moderate spinal stenosis. L4-5: Disc height is relatively preserved. Mild annular disc bulging, facet and ligamentous hypertrophy. Mild resulting spinal stenosis. L5-S1: Chronic loss of disc height with annular disc bulging and endplate osteophytes. Moderate facet hypertrophy accounting for the grade 1 anterolisthesis. Resulting chronic moderate foraminal narrowing bilaterally. IMPRESSION: 1. Acute fractures of the right L1 and L2 transverse processes. No evidence of vertebral body fracture. 2. Lower right rib fractures. 3. Multilevel spondylosis as described, similar to previous abdominopelvic CT. 4. Intra-abdominal findings described separately. Electronically Signed   By: Richardean Sale M.D.   On: 04/06/2017 15:59    EKG: Independently reviewed.sinus bradycardia  Time spent: 65 min  Debbe Odea, MD [facility] To page rounding or on call physician  [email or web address] 04/06/2017, 6:18 PM

## 2017-04-06 NOTE — ED Provider Notes (Signed)
[facility] EMERGENCY DEPARTMENT Provider Note   CSN: [id number] Arrival date & time: 04/06/17  1028     History   Chief Complaint Chief Complaint  Patient presents with  . Fall  . Back Pain    HPI Patrick Holt is a 67 y.o. male history of A. fib not on blood thinners, bipolar, COPD, here presenting with shortness of breath, fall.  Patient is from in this facility currently.  Patient states that he did run out of his pain medicines.  About 3 days ago, he had a mechanical fall onto his back and then afterwards has been in a lot of pain.  Apparently visiting nurse came to see him and brought him to the ED.  He has been having frequent falls and is unable to take care of himself at the assisted living.  Patient states that he has been having chills and progressively worsening shortness of breath as well.  He was noted to be hypoxic 87% on room air at the facility.  He also has been having chills and productive cough as well. He was recently admitted for pneumonia and for CHF.   The history is provided by the patient.    Past Medical History:  Diagnosis Date  . Anemia   . Anxiety   . Asthma   . Atrial fibrillation ([place]) 01/2012   with RVR  . Bipolar 1 disorder ([place])   . Cancer ([place])   . Collagen vascular disease ([place])   . Conversion disorder 06/2010  . COPD (chronic obstructive pulmonary disease) ([place])   . Dermatomyositis ([place])   . Dermatomyositis ([place])   . Diverticulitis   . Diverticulosis of colon   . Dysrhythmia    "irregular" (11/15/2012)  . Esophageal dysmotility   . Esophageal stricture   . Fibromyalgia   . Gastritis   . GERD (gastroesophageal reflux disease)   . Hand fracture, right    sept 13, 2017, still wearing splint as og 01-10-2017  . Headache(784.0)    "severe; get them often" (11/15/2012)  . Hiatal hernia   . History of narcotic addiction ([place])   . Hx of adenomatous colonic polyps   . Hyperlipidemia   . Hypertension   . Internal  hemorrhoids   . Ischemic heart disease   . Major depression    with acute psychotic break in 06/2010  . Myocardial infarct ([place])    mulitple (1999, 2000, 2004)  . Narcotic dependence ([place])   . Nephrolithiasis   . Obesity   . OCD (obsessive compulsive disorder)   . Otosclerosis   . Paroxysmal A-fib ([place])   . Peripheral neuropathy   . Raynaud's disease   . Renal insufficiency   . Rheumatoid arthritis(714.0)   . Sarcoidosis   . Seizures ([place])   . Subarachnoid hemorrhage ([place]) 01/2012   with subdural  hematoma.   . Type II diabetes mellitus ([place])   . Urge incontinence   . Vertigo     Patient Active Problem List   Diagnosis Date Noted  . Evaluation by psychiatric service required 03/07/2017  . Acute metabolic encephalopathy 78/58/8502  . Hypoglycemia 03/04/2017  . Acute respiratory failure with hypoxia ([name]) December 20, 202019  . Acute encephalopathy December 20, 202019  . Rhabdomyolysis 02/05/2017  . Acute respiratory failure with hypoxia and hypercapnia ([place]) 02/05/2017  . COPD with acute exacerbation ([place]) 02/05/2017  . Bipolar 1 disorder ([place]) 02/05/2017  . Anemia 02/05/2017  . Hypertension 02/05/2017  . Hand fracture, right 02/05/2017  . Narcotic dependence (  [place]) 02/05/2017  . Pre-diabetes 02/05/2017  . PAF (paroxysmal atrial fibrillation) ([place]) 02/05/2017  . Frequent falls 02/05/2017  . History of dermatomyositis 02/05/2017  . Seizure disorder ([place]) 02/05/2017  . Bilateral lower extremity edema 02/05/2017  . Pulmonary artery hypertension ([place]) 01/15/2017  . Altered mental status 01/13/2017  . Low oxygen saturation   . Pain in right wrist 02/27/2016  . Altered mental state 11/14/2015  . Severe dehydration 11/14/2015  . Hypernatremia 11/14/2015  . Difficulty in walking, not elsewhere classified   . Right wrist fracture   . Closed displaced fracture of ulnar styloid with routine healing   . Scapholunate ligament injury with no instability   . Falls frequently   . Hypoxia  11/02/2015  . Anxiety 06/06/2015  . Impaired mobility 04/28/2015  . Lumbar spondylosis with myelopathy 03/07/2015  . Nocturia 12/03/2014  . Chronic venous insufficiency 09/23/2014  . Poor social situation 07/30/2014  . Schizophrenia, acute ([place])   . Schizoaffective disorder, bipolar type ([place])   . Decreased visual acuity 06/27/2014  . COPD exacerbation ([place])   . Right sided weakness 01/04/2014  . Essential hypertension, benign   . Atrial fibrillation, unspecified   . Radiculopathy of cervical region   . Polyuria 12/07/2013  . Erectile dysfunction 12/07/2013  . Diverticulitis 08/01/2013  . Acute diverticulitis 08/01/2013  . Hematochezia 06/04/2013  . Hypokalemia 10/03/2012  . Benign neoplasm of colon 09/27/2012  . Stricture and stenosis of esophagus 09/27/2012  . Diverticulitis of colon without hemorrhage 07/26/2012  . Polypharmacy 03/26/2012  . Seizures ([place]) 02/10/2012  . Atrial fibrillation ([place]) 02/05/2012  . Coronary atherosclerosis of native coronary artery 02/05/2012  . Radicular low back pain 03/09/2011  . History of seizure disorder 01/29/2011  . Irritable bowel syndrome (IBS) 06/24/2010  . Urge incontinence 06/19/2010  . TOBACCO USER 11/23/2008  . DEPRESSION, MAJOR 05/31/2008  . ISCHEMIC HEART DISEASE 05/31/2008  . RAYNAUD'S DISEASE 05/31/2008  . HIATAL HERNIA 05/31/2008  . Rheumatoid arthritis ([place]) 05/31/2008  . FIBROMYALGIA 05/31/2008  . Hyperlipidemia 01/26/2008  . Anxiety state 01/26/2008  . HEMORRHOIDS, INTERNAL 01/26/2008  . COPD (chronic obstructive pulmonary disease) ([place]) 01/26/2008  . ESOPHAGEAL MOTILITY DISORDER 01/26/2008  . GERD 01/26/2008  . Dermatomyositis ([place]) 01/26/2008    Past Surgical History:  Procedure Laterality Date  . BACK SURGERY    . CARDIAC CATHETERIZATION    . CARPAL TUNNEL RELEASE Bilateral   . CATARACT EXTRACTION W/ INTRAOCULAR LENS IMPLANT Left   . COLONOSCOPY N/A 09/27/2012   Procedure: COLONOSCOPY;  Surgeon: Lafayette Dragon, MD;  Location: [facility] ENDOSCOPY;  Service: Endoscopy;  Laterality: N/A;  . ESOPHAGOGASTRODUODENOSCOPY N/A 09/27/2012   Procedure: ESOPHAGOGASTRODUODENOSCOPY (EGD);  Surgeon: Lafayette Dragon, MD;  Location: [facility];  Service: Endoscopy;  Laterality: N/A;  . KNEE ARTHROSCOPY W/ MENISCAL REPAIR Left 2009  . LUMBAR DISC SURGERY    . LYMPH NODE DISSECTION Right 1970's   "neck; dr thought I had Hodgkins; turned out to be sarcoidosis" (11/15/2012)  . squamous papilloma   2010   removed by Dr. Constance Holster ENT, noted on tongue  . TONSILLECTOMY         Home Medications    Prior to Admission medications   Medication Sig Start Date End Date Taking? Authorizing Provider  ALPRAZolam Duanne Moron) 1 MG tablet Take 1 mg by mouth 3 (three) times daily. 01/10/17  Yes [provider]  amiodarone (PACERONE) 200 MG tablet Take 1 tablet (200 mg total) by mouth daily. 02/10/17  Yes Alma Friendly, MD  amLODipine (NORVASC) 5 MG tablet Take 1 tablet (5 mg total) by mouth daily. 01/19/17  Yes Velvet Bathe, MD  aspirin EC 81 MG tablet Take 1 tablet (81 mg total) by mouth daily. 10/17/15  Yes [name], MD  atorvastatin (LIPITOR) 40 MG tablet Take 1 tablet (40 mg total) by mouth daily. 01/19/17  Yes Velvet Bathe, MD  budesonide-formoterol [facility]) 160-4.5 MCG/ACT inhaler Inhale 2 puffs into the lungs 2 (two) times daily. 01/19/17  Yes Velvet Bathe, MD  divalproex (DEPAKOTE) 250 MG DR tablet Take 750 mg by mouth daily. 03/16/17  Yes [provider]  ergocalciferol (VITAMIN D2) 50000 units capsule Take 50,000 Units by mouth once a week.   Yes [provider]  esomeprazole (NEXIUM) 40 MG capsule Take 40 mg by mouth daily. 07/26/16  Yes [provider]  furosemide (LASIX) 20 MG tablet Take 1 tablet (20 mg total) by mouth daily. 02/23/17 02/23/18 Yes Domenic Polite, MD  ibuprofen (ADVIL,MOTRIN) 200 MG tablet Take 200 mg by mouth every 6 (six) hours as needed for headache or moderate  pain.   Yes [provider]  ipratropium-albuterol (DUONEB) 0.5-2.5 (3) MG/3ML SOLN Inhale 3 mLs into the lungs every 6 (six) hours as needed. Patient taking differently: Inhale 3 mLs into the lungs every 6 (six) hours as needed (for breathing).  11/17/15  Yes Elgergawy, Silver Huguenin, MD  levETIRAcetam (KEPPRA) 500 MG tablet Take 500 mg by mouth 2 (two) times daily.   Yes [provider]  loperamide (IMODIUM) 2 MG capsule Take 2 mg by mouth as needed for diarrhea or loose stools.   Yes [provider]  Multiple Vitamin (MULTIVITAMIN WITH MINERALS) TABS tablet Take 1 tablet by mouth daily. 10/17/15  Yes [name], MD  oxybutynin (DITROPAN) 5 MG tablet Take 15 mg by mouth daily.  07/26/16  Yes [provider]  potassium chloride 20 MEQ TBCR Take 20 mEq by mouth daily. 02/23/17  Yes Domenic Polite, MD  risperiDONE (RISPERDAL) 1 MG tablet Take 1 mg by mouth 2 (two) times daily. 03/25/17  Yes [provider]  traMADol (ULTRAM) 50 MG tablet Take 50 mg by mouth every 6 (six) hours as needed for moderate pain.   Yes [provider]    Family History Family History  Problem Relation Age of Onset  . Alcohol abuse Mother   . Depression Mother   . Heart disease Mother   . Diabetes Mother   . Stroke Mother   . Coronary artery disease Father   . Diabetes Other        1/2 brother  . Hepatitis Brother     Social History Social History   Tobacco Use  . Smoking status: Former Smoker    Packs/day: 0.50    Years: 30.00    Pack years: 15.00    Types: Cigarettes    Last attempt to quit: 12/15/2014    Years since quitting: 2.3  . Smokeless tobacco: Never Used  . Tobacco comment: 3 cigs a day  Substance Use Topics  . Alcohol use: No    Alcohol/week: 0.0 oz    Comment: Alcohol stopped in September of 2014  . Drug use: No     Allergies   Immune globulins; Ciprofloxacin; Flagyl [metronidazole]; Lisinopril; Sulfa antibiotics; Cefepime; Morphine and  related; Requip [ropinirole hcl]; Vancomycin; Betamethasone dipropionate; Bupropion hcl; Clobetasol; Codeine; Escitalopram oxalate; Fluoxetine hcl; Meclizine; Paroxetine; Penicillins; Tacrolimus; Tetanus toxoid; and Tuberculin purified protein derivative   Review of Systems Review  of Systems  Respiratory: Positive for shortness of breath.   Musculoskeletal: Positive for back pain.  All other systems reviewed and are negative.    Physical Exam Updated Vital Signs BP 140/74 (BP Location: Left Arm)   Pulse (!) 54   Temp 97.6 F (36.4 C) (Oral)   Resp 18   SpO2 96%   Physical Exam  Constitutional: He is oriented to person, place, and time.  Chronically ill, tachypneic   HENT:  Head: Normocephalic.  Mouth/Throat: Oropharynx is clear and moist.  Eyes: Conjunctivae and EOM are normal. Pupils are equal, round, and reactive to light.  Neck: Normal range of motion. Neck supple.  Cardiovascular: Normal rate, regular rhythm and normal heart sounds.  Pulmonary/Chest: Effort normal.  Diminished bilateral bases, no obvious wheezing   Abdominal: Soft. Bowel sounds are normal. He exhibits no distension. There is no tenderness.  Musculoskeletal:  Mild R paralumbar tenderness vs CVAT   Neurological: He is alert and oriented to person, place, and time.  Skin: Skin is warm.  Psychiatric: He has a normal mood and affect.  Nursing note and vitals reviewed.    ED Treatments / Results  Labs (all labs ordered are listed, but only abnormal results are displayed) Labs Reviewed  CBC WITH DIFFERENTIAL/PLATELET - Abnormal; Notable for the following components:      Result Value   RDW 20.5 (*)    All other components within normal limits  COMPREHENSIVE METABOLIC PANEL - Abnormal; Notable for the following components:   Calcium 8.5 (*)    Total Protein 6.0 (*)    Albumin 2.9 (*)    AST 12 (*)    ALT 8 (*)    All other components within normal limits  CK - Abnormal; Notable for the following  components:   Total CK 37 (*)    All other components within normal limits  URINE CULTURE  BRAIN NATRIURETIC PEPTIDE  INFLUENZA PANEL BY PCR (TYPE A & B)  URINALYSIS, ROUTINE W REFLEX MICROSCOPIC  I-STAT TROPONIN, ED  I-STAT CG4 LACTIC ACID, ED  I-STAT CG4 LACTIC ACID, ED    EKG  EKG Interpretation  Date/Time:  Wednesday April 06 2017 11:14:48 EST Ventricular Rate:  58 PR Interval:  194 QRS Duration: 88 QT Interval:  410 QTC Calculation: 402 R Axis:   7 Text Interpretation:  Sinus bradycardia Nonspecific T wave abnormality Abnormal ECG No significant change since last tracing Confirmed by Wandra Arthurs [phone]) on 04/06/2017 11:28:49 AM       Radiology Dg Chest 2 View  Result Date: 04/06/2017 CLINICAL DATA:  Fall, lower back pain and shortness of breath, initial encounter. EXAM: CHEST  2 VIEW COMPARISON:  CT chest 03/05/2017 and chest radiograph 03/04/2017. FINDINGS: Trachea is midline. Heart size stable. Thoracic aorta is calcified. Mild bibasilar atelectasis. No airspace consolidation or pleural fluid. No pneumothorax. Osseous structures appear grossly intact. IMPRESSION: Bibasilar atelectasis. Electronically Signed   By: Lorin Picket M.D.   On: 04/06/2017 12:27   Dg Lumbar Spine Complete  Result Date: 04/06/2017 CLINICAL DATA:  Recent fall.  Low back pain EXAM: LUMBAR SPINE - COMPLETE 4+ VIEW COMPARISON:  Lumbar radiographs 01/26/2017 FINDINGS: Negative for fracture Multilevel degenerative change with anterior osteophyte formation. Disc space narrowing most prominent L2-3. Grade 1 anterolisthesis L5-S1 unchanged. No pars defect identified Atherosclerotic aorta.  Constipation.  Mild scoliosis IMPRESSION: Lumbar degenerative changes.  Negative for fracture Constipation Electronically Signed   By: Franchot Gallo M.D.   On: 04/06/2017 12:26  Procedures Procedures (including critical care time)  Medications Ordered in ED Medications  iopamidol (ISOVUE-300) 61 % injection  (not administered)  HYDROmorphone (DILAUDID) injection 1 mg (1 mg Intravenous Given 04/06/17 1358)  iopamidol (ISOVUE-370) 76 % injection 100 mL (100 mLs Intravenous Contrast Given 04/06/17 1442)     Initial Impression / Assessment and Plan / ED Course  I have reviewed the triage vital signs and the nursing notes.  Pertinent labs & imaging results that were available during my care of the patient were reviewed by me and considered in my medical decision making (see chart for details).  Clinical Course as of Apr 07 1551  Wed Apr 06, 2017  1551 CT ABDOMEN PELVIS W CONTRAST [MJ]  5974 DG Chest 2 View [MJ]  1551 DG Chest 2 View [MJ]  1638 DG Chest 2 View [MJ]    Clinical Course User Index [MJ] Tanna Furry, MD   Patrick Holt is a 66 y.o. male here with back pain, SOB, hypoxia. Hx of CHF, COPD. He is hypoxic to 87% on RA and is not on oxygen at baseline. Consider COPD vs CHF vs PE. Will get labs, CTA chest, CT renal stone. Will give pain meds and reassess. No saddle anesthesia on exam.    3:53 PM Labs unremarkable. CTA chest pending. Anticipate admission for hypoxia and unsafe social situation. Flu negative. Signed out to Dr. Jeneen Rinks.   Final Clinical Impressions(s) / ED Diagnoses   Final diagnoses:  Trauma    ED Discharge Orders    None       Drenda Freeze, MD 04/06/17 1606

## 2017-04-06 NOTE — ED Notes (Signed)
Pt back from x-ray.

## 2017-04-06 NOTE — ED Notes (Signed)
Patient transported to X-ray 

## 2017-04-06 NOTE — Discharge Instructions (Signed)
You are currently dehydrated. Do not take Lasix for the next 3 days. See your doctor to decide when to resume it.   Please take all your medications with you for your next visit with your Primary MD. Please request your Primary MD to go over all hospital test results at the follow up. Please ask your Primary MD to get all Hospital records sent to his/her office.  If you experience worsening of your admission symptoms, develop shortness of breath, chest pain, suicidal or homicidal thoughts or a life threatening emergency, you must seek medical attention immediately by calling 911 or calling your MD.  Dennis Bast must read the complete instructions/literature along with all the possible adverse reactions/side effects for all the medicines you take including new medications that have been prescribed to you. Take new medicines after you have completely understood and accpet all the possible adverse reactions/side effects.   Do not drive when taking pain medications or sedatives.    Do not take more than prescribed Pain, Sleep and Anxiety Medications  If you have smoked or chewed Tobacco in the last 2 yrs please stop. Stop any regular alcohol and or recreational drug use.  Wear Seat belts while driving.

## 2017-04-07 LAB — URINE CULTURE: Culture: <10000 — AB

## 2017-04-18 IMAGING — MR MR HEAD W/O CM
9 of 11 series · 35 of 48 positions shown · non-contrast
Comparison: CT head 07/08/2014.  MR head 01/04/14.

CLINICAL DATA: Patient complains of mouth and LEFT leg numbness for
1 week. Positional vertigo.

EXAM:
MRI HEAD WITHOUT CONTRAST
TECHNIQUE: Multiplanar, multiecho pulse sequences of the brain and surrounding
structures were obtained without intravenous contrast.

[Series 3: DWI · axial · 3.6mm · 0.94mm/px · z∈[-28,+112]mm · 9 of 80 slices shown (1 of 4)]
[im 1/80]
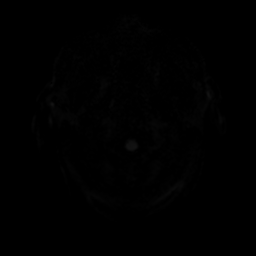
[im 10/80]
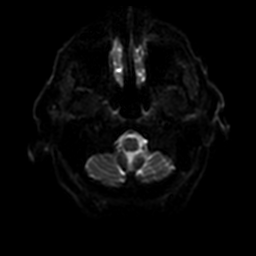
[im 20/80]
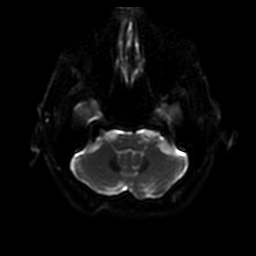
[im 30/80]
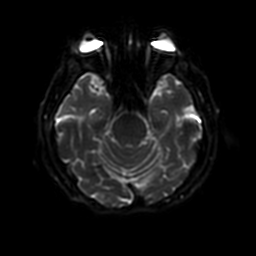
[im 40/80]
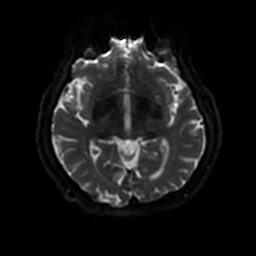
[im 50/80]
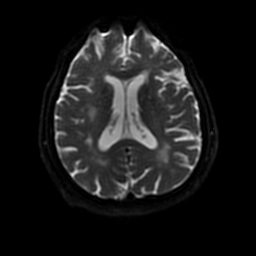
[im 60/80]
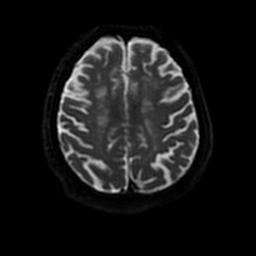
[im 70/80]
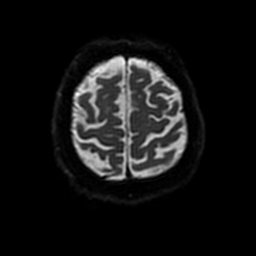
[im 80/80]
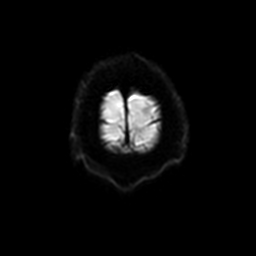

[Series 4: FLAIR · sagittal · 5.0mm · 0.47mm/px · 2 of 23 slices shown]
[im 1/23]
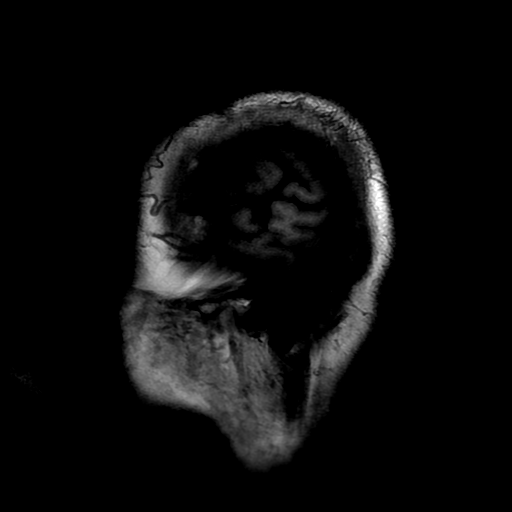
[im 23/23]
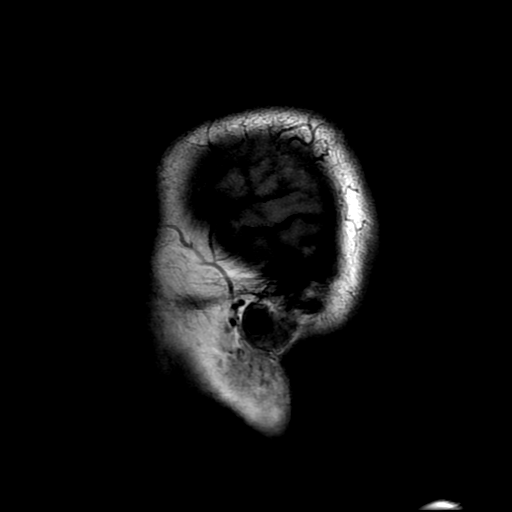

[Series 5: T2 · axial · 5.0mm · 0.47mm/px · z∈[-27,+110]mm · 2 of 24 slices shown (1 of 2)]
[im 1/24]
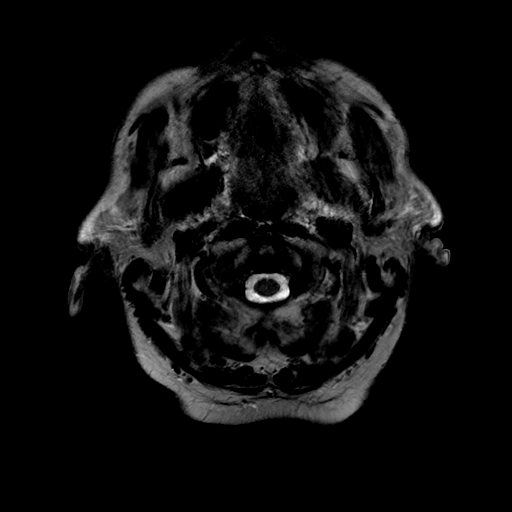
[im 24/24]
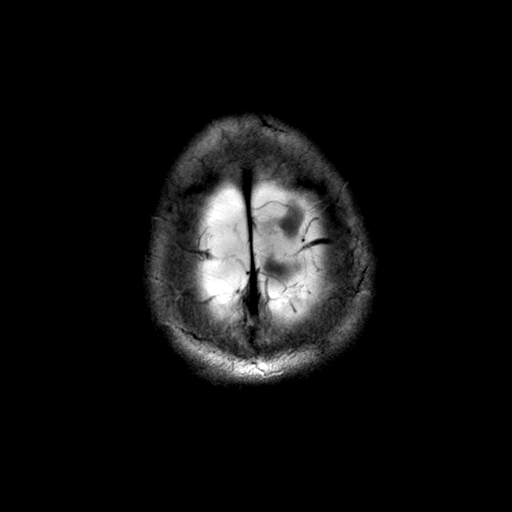

[Series 6: FLAIR fat-sat · axial · 5.0mm · 0.47mm/px · z∈[-27,+110]mm · 2 of 24 slices shown]
[im 1/24]
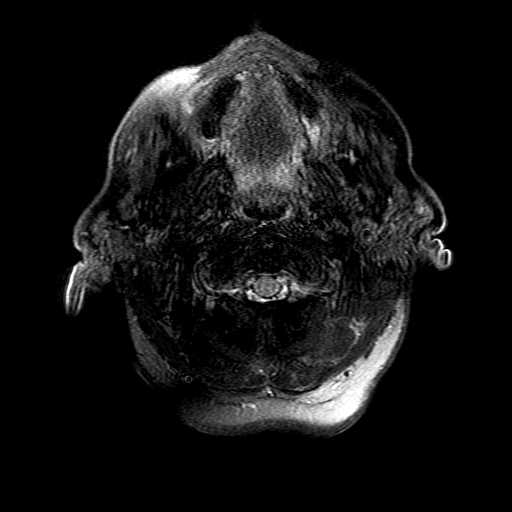
[im 24/24]
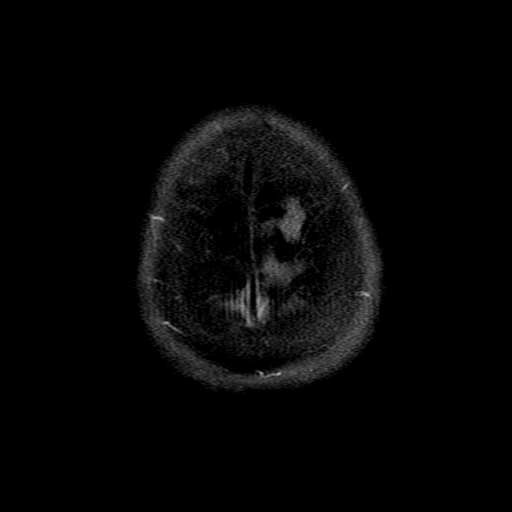

[Series 7: DWI · coronal · 5.0mm · 0.94mm/px · 6 of 62 slices shown (2 of 4)]
[im 1/62]
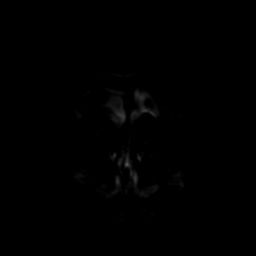
[im 13/62]
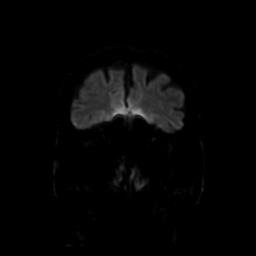
[im 25/62]
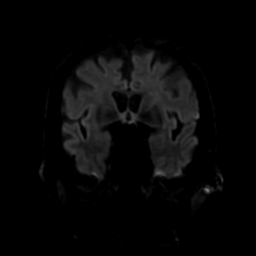
[im 37/62]
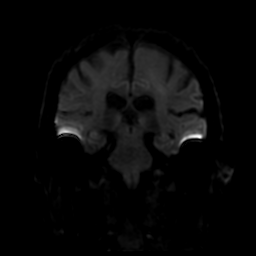
[im 49/62]
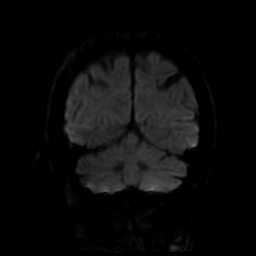
[im 62/62]
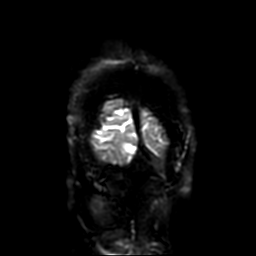

[Series 8: (person_name) · axial · 3.0mm · 0.47mm/px · z∈[-29,+34]mm · 4 of 96 slices shown]
[im 1/96]
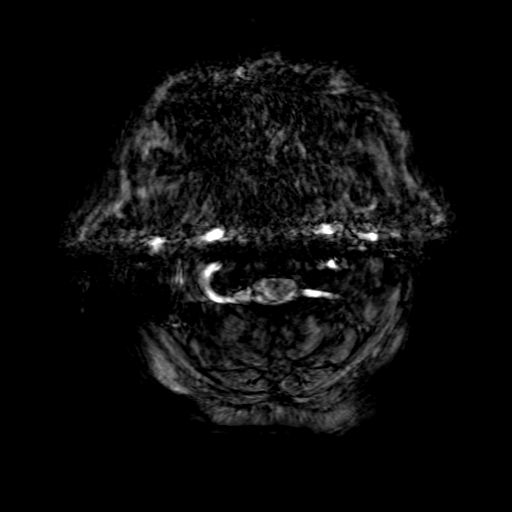
[im 11/96]
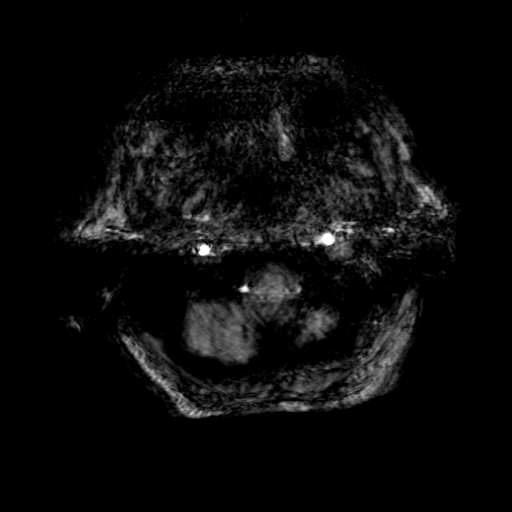
[im 32/96]
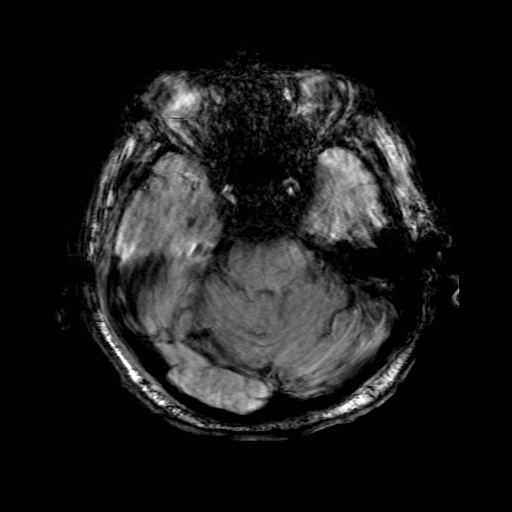
[im 43/96]
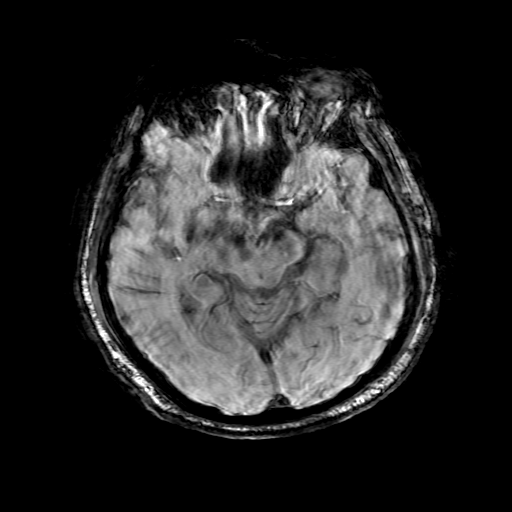

[Series 11: T2 · coronal · 5.0mm · 0.39mm/px · 3 of 26 slices shown (2 of 2)]
[im 1/26]
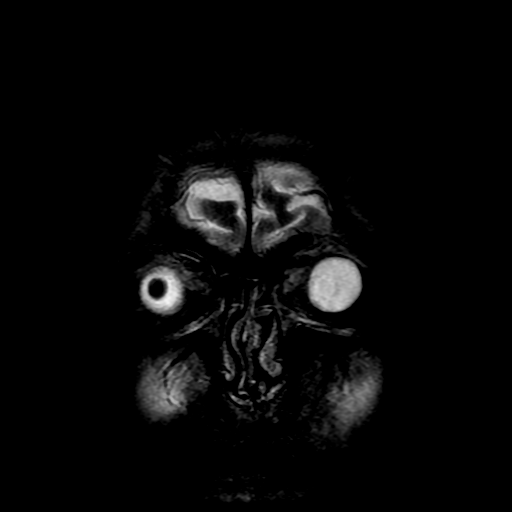
[im 13/26]
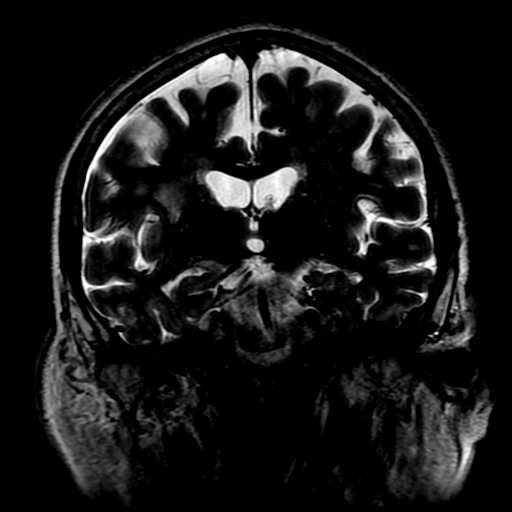
[im 26/26]
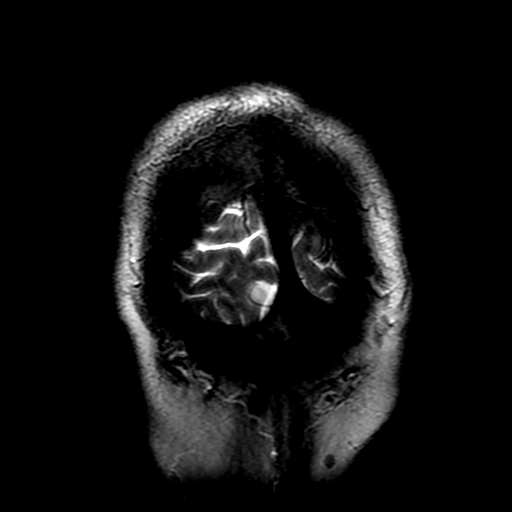

[Series 300: DWI · axial · 3.6mm · 0.94mm/px · z∈[-28,+112]mm · 4 of 39 slices shown (3 of 4)]
[im 1/39]
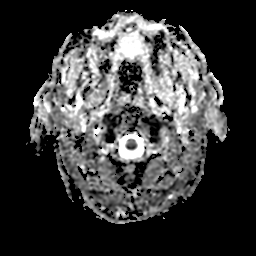
[im 13/39]
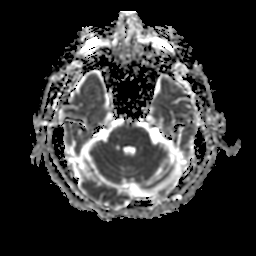
[im 26/39]
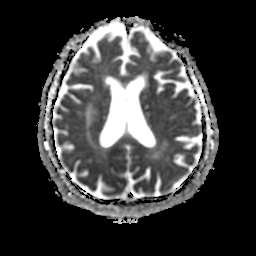
[im 39/39]
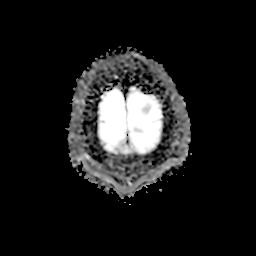

[Series 700: DWI · coronal · 5.0mm · 0.94mm/px · 3 of 31 slices shown (4 of 4)]
[im 1/31]
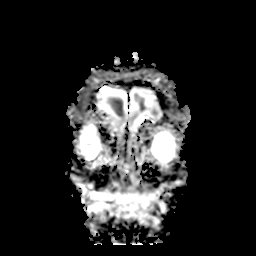
[im 16/31]
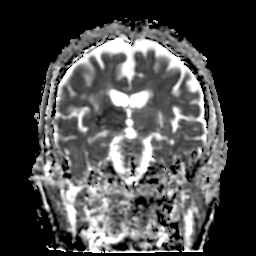
[im 31/31]
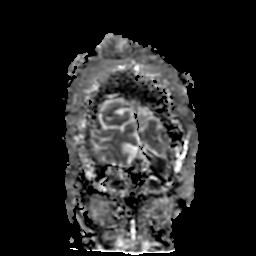

[35 of 48 positions shown; findings below may reference images not displayed]

FINDINGS: No evidence for acute infarction, hemorrhage, mass lesion,
hydrocephalus, or extra-axial fluid. Generalized atrophy. Extensive
small vessel disease. Scattered chronic lacunar infarcts throughout
the deep nuclei, and deep white matter, stable. Flow voids are
maintained. No chronic hemorrhage. No midline abnormality. LEFT
cataract extraction, otherwise unremarkable extracranial soft
tissues.
IMPRESSION: Chronic changes as described, similar to prior CT and MR.

No acute intracranial findings.

## 2017-04-20 IMAGING — DX DG CHEST 2V
2 series · 2 of 2 positions shown · non-contrast
Comparison: 07/31/2014

CLINICAL DATA: Ongoing facial numbness into the jaw since
[REDACTED]. Worsening dizziness. Constant chest pain. History of
smoking. AFib. History of stroke.

EXAM:
CHEST  2 VIEW

[chest lat]
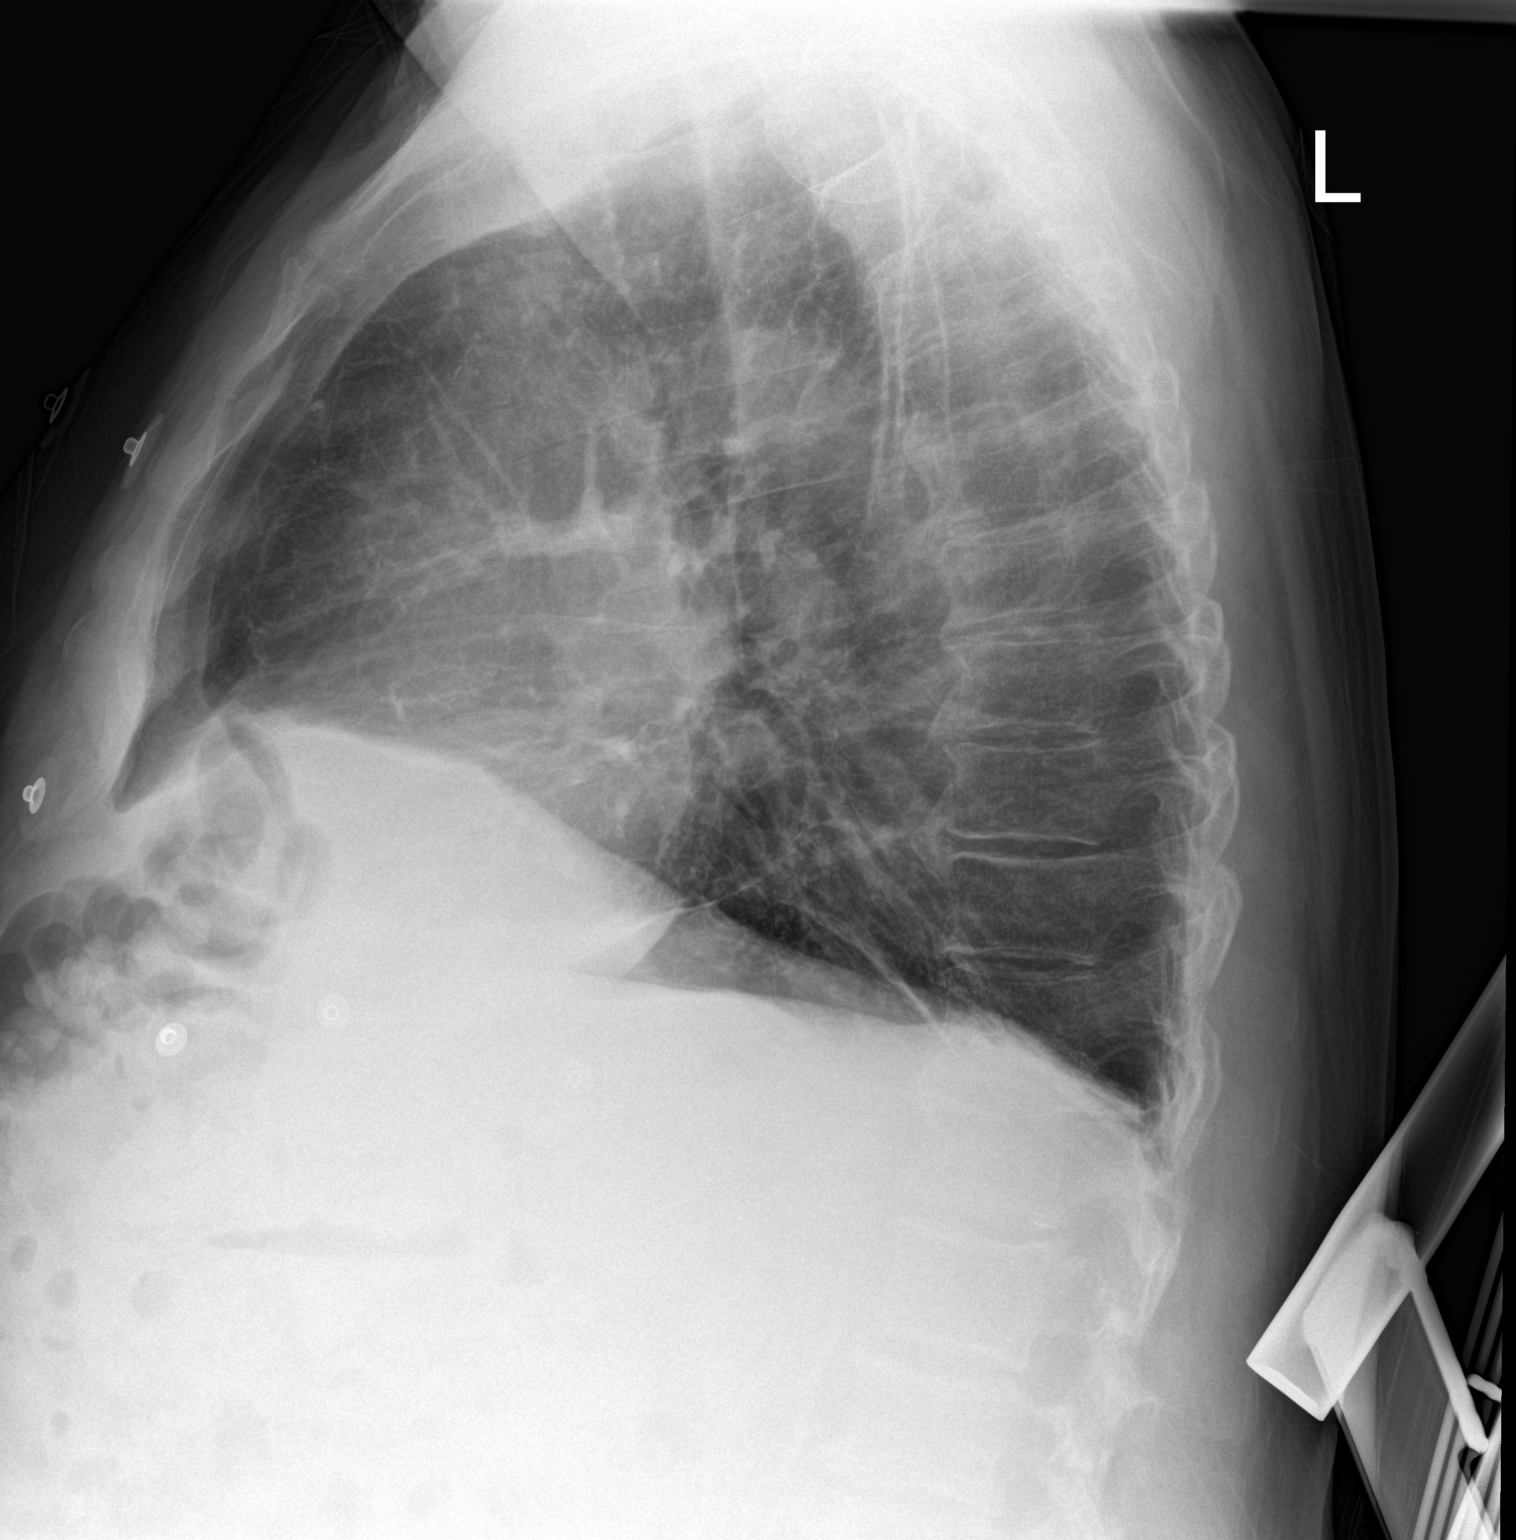

[chest ap]
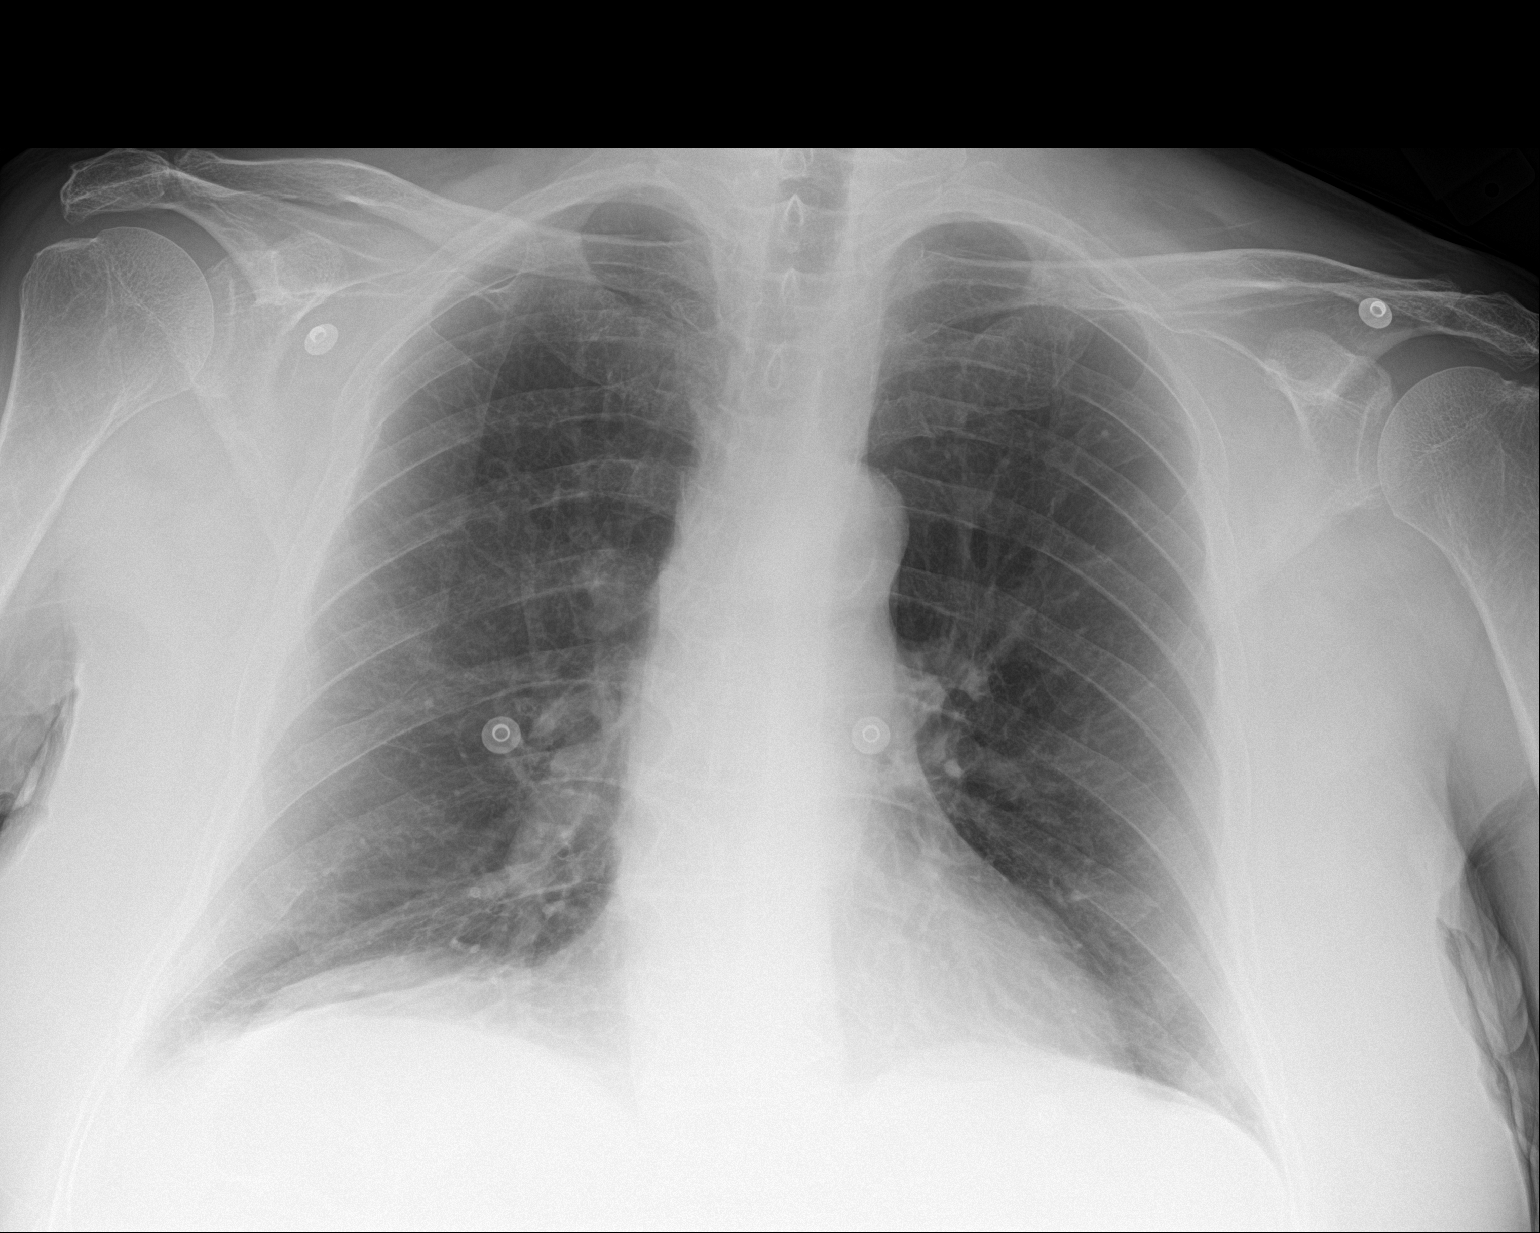

[2 of 2 positions shown; findings below may reference images not displayed]

FINDINGS: Lungs are mildly hyperinflated. Heart size is normal. There are no
focal consolidations or pleural effusions. No pulmonary edema. Old
rib fractures. Mid thoracic spondylosis.
IMPRESSION: Hyperinflation.  No evidence for acute  abnormality.

## 2017-06-10 IMAGING — CT CT HEAD W/O CM
2 series · 15 of 30 positions shown, 17 images · non-contrast
Comparison: CT of the head performed 07/08/2014, and MRI of the
brain performed 10/22/2014 kids

CLINICAL DATA: Acute onset of left-sided facial numbness. Initial
encounter.

EXAM:
CT HEAD WITHOUT CONTRAST
TECHNIQUE: Contiguous axial images were obtained from the base of the skull
through the vertex without intravenous contrast.

[Series 2: head without · axial · non-contrast · 0.42mm/px · z∈[-106,+14]mm · 7 of 33 slices shown, 9 images]
[im 5/33  brain]
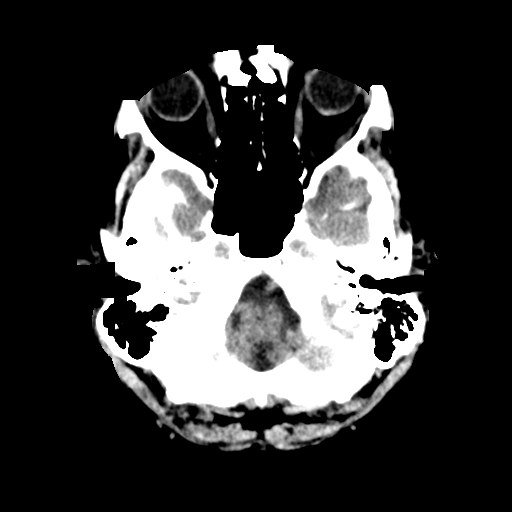
[im 5/33  bone]
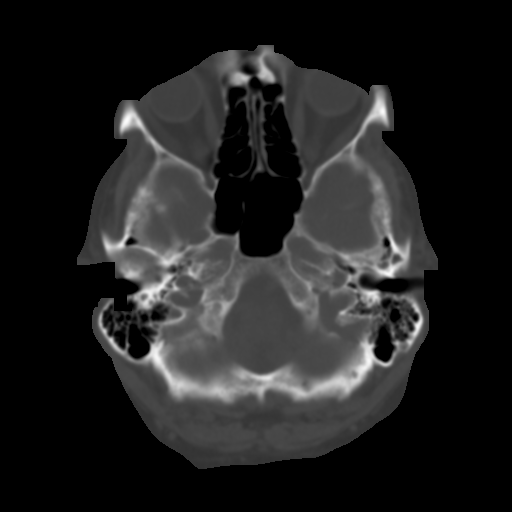
[im 9/33  brain]
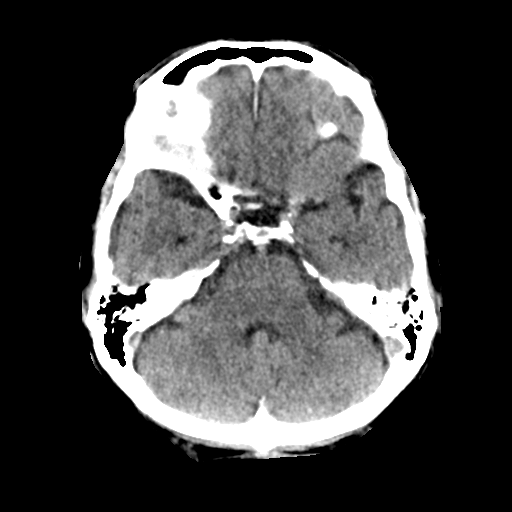
[im 13/33  brain]
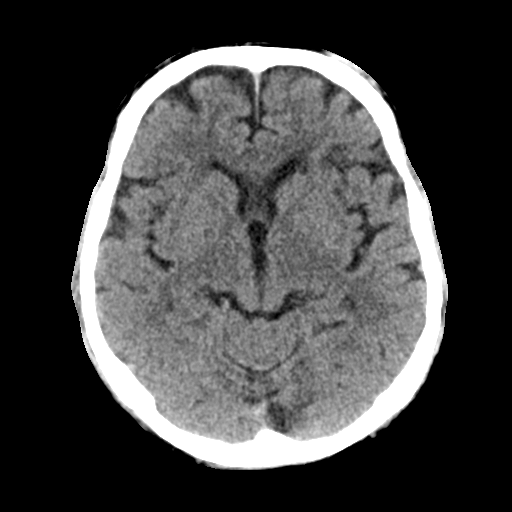
[im 17/33  brain]
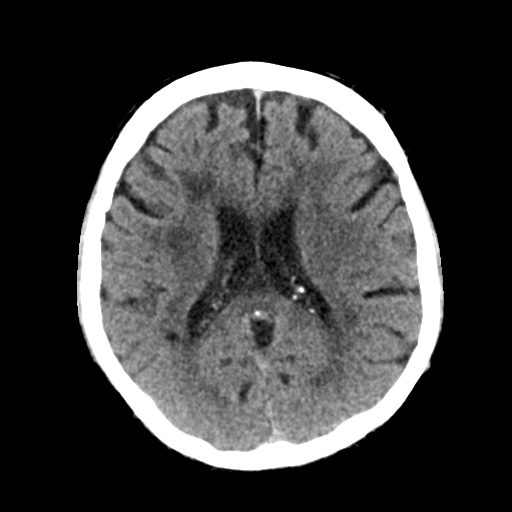
[im 21/33  brain]
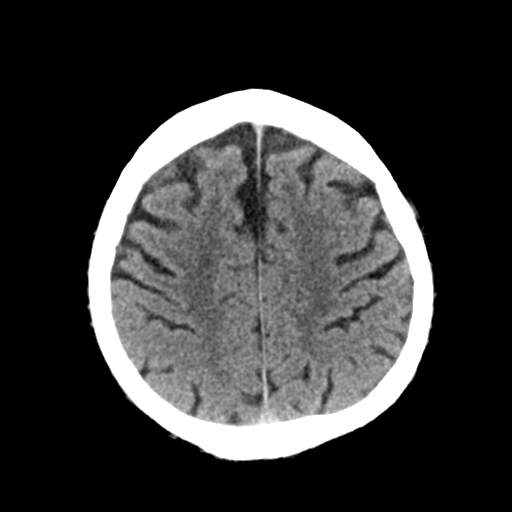
[im 21/33  bone]
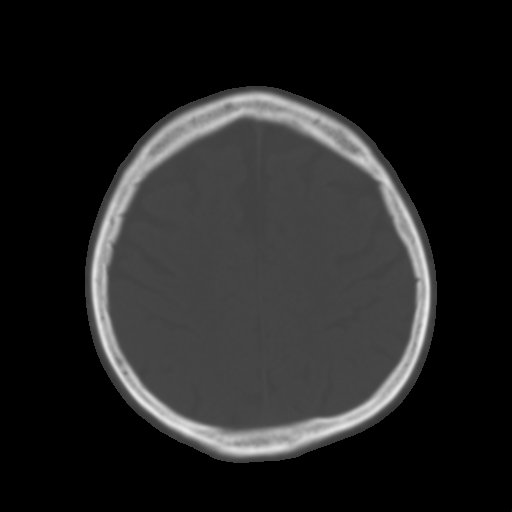
[im 25/33  brain]
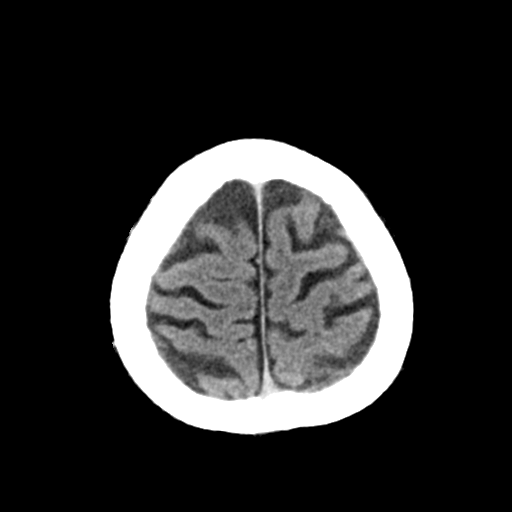
[im 29/33  brain]
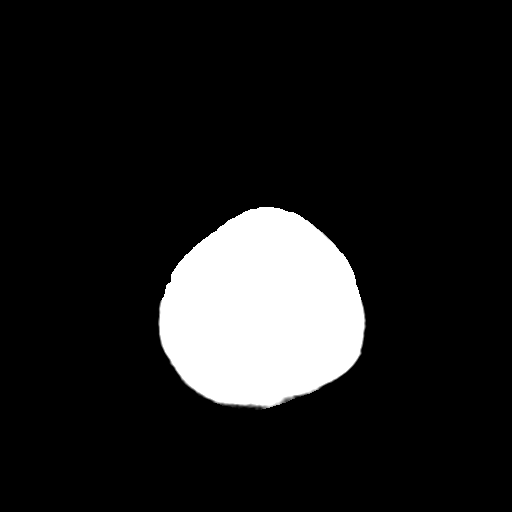

[Series 3: head bone · axial · 0.42mm/px · z∈[-110,+18]mm · 8 of 82 slices shown]
[im 9/82  bone]
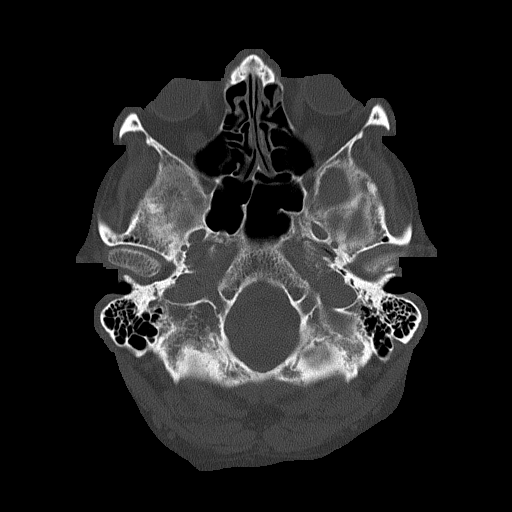
[im 17/82  bone]
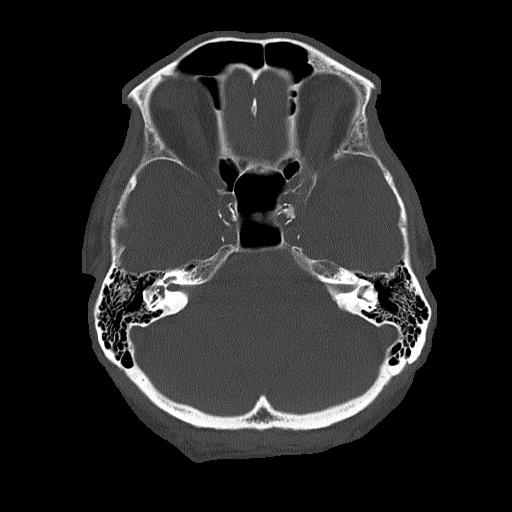
[im 25/82  bone]
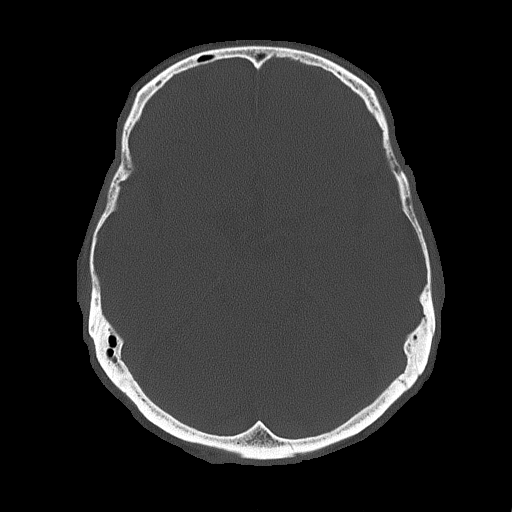
[im 37/82  bone]
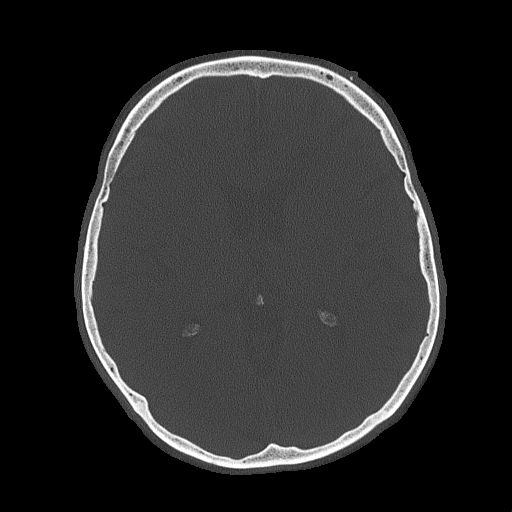
[im 45/82  bone]
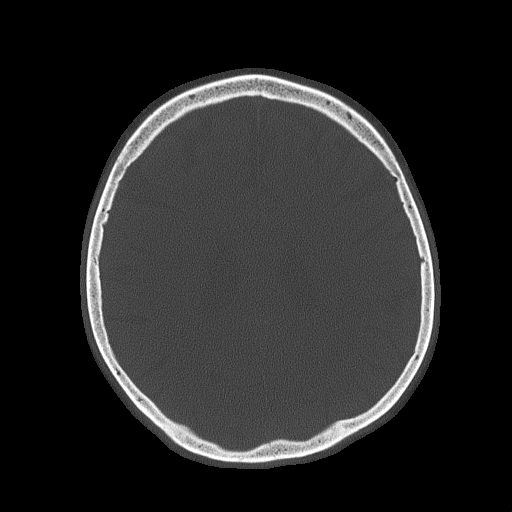
[im 57/82  bone]
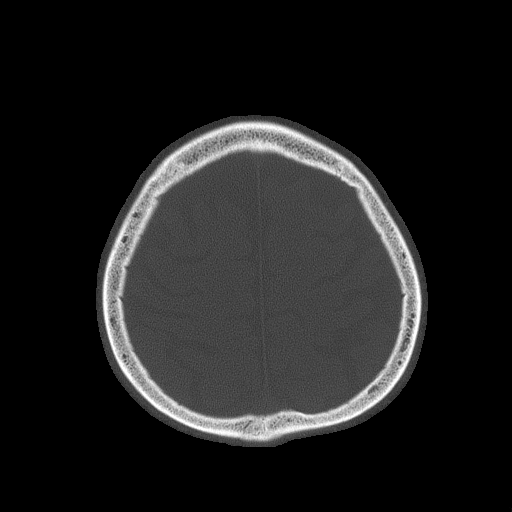
[im 65/82  bone]
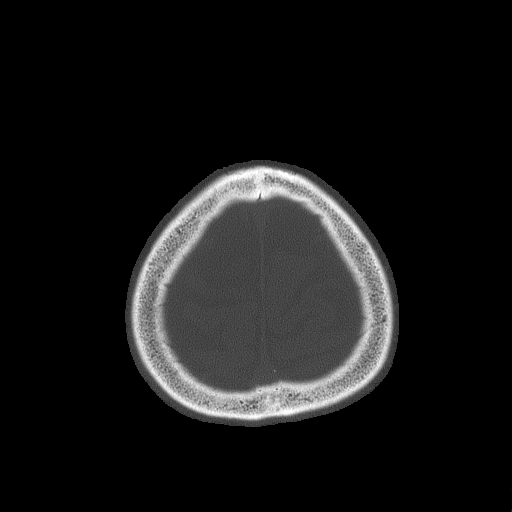
[im 73/82  bone]
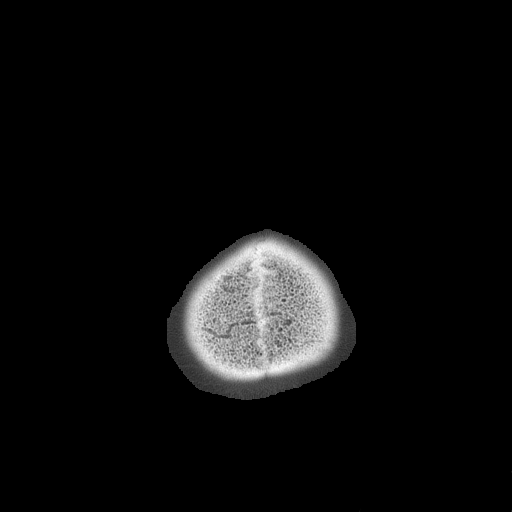

[15 of 30 positions shown; findings below may reference images not displayed]

FINDINGS: There is no evidence of acute infarction, mass lesion, or intra- or
extra-axial hemorrhage on CT.

Prominence of the sulci suggests mild cortical volume loss.
Scattered periventricular and subcortical white matter change likely
reflects small vessel ischemic microangiopathy.

The brainstem and fourth ventricle are within normal limits. The
basal ganglia are unremarkable in appearance. The cerebral
hemispheres demonstrate grossly normal gray-white differentiation.
No mass effect or midline shift is seen.

There is no evidence of fracture; visualized osseous structures are
unremarkable in appearance. The orbits are within normal limits. The
paranasal sinuses and mastoid air cells are well-aerated. No
significant soft tissue abnormalities are seen.
IMPRESSION: 1. No evidence of traumatic intracranial injury or fracture.
2. Mild cortical volume loss and scattered small vessel ischemic
microangiopathy.

## 2017-06-16 ENCOUNTER — Encounter (HOSPITAL_COMMUNITY): Payer: Self-pay

## 2017-06-16 ENCOUNTER — Emergency Department (HOSPITAL_COMMUNITY): Payer: Medicare Other

## 2017-06-16 ENCOUNTER — Observation Stay (HOSPITAL_COMMUNITY)
Admission: EM | Admit: 2017-06-16 | Discharge: 2017-06-16 | Discharge status: Against Medical Advice | Payer: Medicare Other | Attending: Internal Medicine | Admitting: Internal Medicine

## 2017-06-16 DIAGNOSIS — M3390 Dermatopolymyositis, unspecified, organ involvement unspecified: Secondary | ICD-10-CM | POA: Insufficient documentation

## 2017-06-16 DIAGNOSIS — I200 Unstable angina: Secondary | ICD-10-CM | POA: Diagnosis present

## 2017-06-16 DIAGNOSIS — E1140 Type 2 diabetes mellitus with diabetic neuropathy, unspecified: Secondary | ICD-10-CM | POA: Insufficient documentation

## 2017-06-16 DIAGNOSIS — Z833 Family history of diabetes mellitus: Secondary | ICD-10-CM | POA: Insufficient documentation

## 2017-06-16 DIAGNOSIS — G8929 Other chronic pain: Secondary | ICD-10-CM | POA: Insufficient documentation

## 2017-06-16 DIAGNOSIS — I252 Old myocardial infarction: Secondary | ICD-10-CM | POA: Insufficient documentation

## 2017-06-16 DIAGNOSIS — Z8673 Personal history of transient ischemic attack (TIA), and cerebral infarction without residual deficits: Secondary | ICD-10-CM | POA: Insufficient documentation

## 2017-06-16 DIAGNOSIS — F1120 Opioid dependence, uncomplicated: Secondary | ICD-10-CM | POA: Diagnosis not present

## 2017-06-16 DIAGNOSIS — F429 Obsessive-compulsive disorder, unspecified: Secondary | ICD-10-CM | POA: Insufficient documentation

## 2017-06-16 DIAGNOSIS — F411 Generalized anxiety disorder: Secondary | ICD-10-CM | POA: Diagnosis not present

## 2017-06-16 DIAGNOSIS — D869 Sarcoidosis, unspecified: Secondary | ICD-10-CM | POA: Insufficient documentation

## 2017-06-16 DIAGNOSIS — K219 Gastro-esophageal reflux disease without esophagitis: Secondary | ICD-10-CM

## 2017-06-16 DIAGNOSIS — M545 Low back pain: Secondary | ICD-10-CM | POA: Insufficient documentation

## 2017-06-16 DIAGNOSIS — F329 Major depressive disorder, single episode, unspecified: Secondary | ICD-10-CM | POA: Diagnosis present

## 2017-06-16 DIAGNOSIS — E785 Hyperlipidemia, unspecified: Secondary | ICD-10-CM | POA: Diagnosis not present

## 2017-06-16 DIAGNOSIS — J90 Pleural effusion, not elsewhere classified: Secondary | ICD-10-CM | POA: Insufficient documentation

## 2017-06-16 DIAGNOSIS — F250 Schizoaffective disorder, bipolar type: Secondary | ICD-10-CM | POA: Diagnosis present

## 2017-06-16 DIAGNOSIS — M5416 Radiculopathy, lumbar region: Secondary | ICD-10-CM | POA: Diagnosis not present

## 2017-06-16 DIAGNOSIS — I872 Venous insufficiency (chronic) (peripheral): Secondary | ICD-10-CM | POA: Diagnosis not present

## 2017-06-16 DIAGNOSIS — S32019D Unspecified fracture of first lumbar vertebra, subsequent encounter for fracture with routine healing: Secondary | ICD-10-CM

## 2017-06-16 DIAGNOSIS — I480 Paroxysmal atrial fibrillation: Secondary | ICD-10-CM

## 2017-06-16 DIAGNOSIS — S32019A Unspecified fracture of first lumbar vertebra, initial encounter for closed fracture: Secondary | ICD-10-CM | POA: Diagnosis present

## 2017-06-16 DIAGNOSIS — R0902 Hypoxemia: Secondary | ICD-10-CM | POA: Diagnosis not present

## 2017-06-16 DIAGNOSIS — I2510 Atherosclerotic heart disease of native coronary artery without angina pectoris: Secondary | ICD-10-CM | POA: Diagnosis present

## 2017-06-16 DIAGNOSIS — Z880 Allergy status to penicillin: Secondary | ICD-10-CM | POA: Insufficient documentation

## 2017-06-16 DIAGNOSIS — Z823 Family history of stroke: Secondary | ICD-10-CM | POA: Insufficient documentation

## 2017-06-16 DIAGNOSIS — M5412 Radiculopathy, cervical region: Secondary | ICD-10-CM | POA: Diagnosis present

## 2017-06-16 DIAGNOSIS — Z818 Family history of other mental and behavioral disorders: Secondary | ICD-10-CM | POA: Insufficient documentation

## 2017-06-16 DIAGNOSIS — Z887 Allergy status to serum and vaccine status: Secondary | ICD-10-CM | POA: Insufficient documentation

## 2017-06-16 DIAGNOSIS — Z79899 Other long term (current) drug therapy: Secondary | ICD-10-CM | POA: Insufficient documentation

## 2017-06-16 DIAGNOSIS — M8448XK Pathological fracture, other site, subsequent encounter for fracture with nonunion: Secondary | ICD-10-CM | POA: Insufficient documentation

## 2017-06-16 DIAGNOSIS — R296 Repeated falls: Secondary | ICD-10-CM

## 2017-06-16 DIAGNOSIS — G40909 Epilepsy, unspecified, not intractable, without status epilepticus: Secondary | ICD-10-CM | POA: Diagnosis not present

## 2017-06-16 DIAGNOSIS — I2721 Secondary pulmonary arterial hypertension: Secondary | ICD-10-CM | POA: Diagnosis present

## 2017-06-16 DIAGNOSIS — I10 Essential (primary) hypertension: Secondary | ICD-10-CM | POA: Diagnosis present

## 2017-06-16 DIAGNOSIS — Z9842 Cataract extraction status, left eye: Secondary | ICD-10-CM | POA: Insufficient documentation

## 2017-06-16 DIAGNOSIS — Z8669 Personal history of other diseases of the nervous system and sense organs: Secondary | ICD-10-CM

## 2017-06-16 DIAGNOSIS — E669 Obesity, unspecified: Secondary | ICD-10-CM | POA: Insufficient documentation

## 2017-06-16 DIAGNOSIS — J410 Simple chronic bronchitis: Secondary | ICD-10-CM

## 2017-06-16 DIAGNOSIS — F419 Anxiety disorder, unspecified: Secondary | ICD-10-CM | POA: Insufficient documentation

## 2017-06-16 DIAGNOSIS — Z8489 Family history of other specified conditions: Secondary | ICD-10-CM | POA: Insufficient documentation

## 2017-06-16 DIAGNOSIS — J449 Chronic obstructive pulmonary disease, unspecified: Secondary | ICD-10-CM | POA: Diagnosis present

## 2017-06-16 DIAGNOSIS — Z6833 Body mass index (BMI) 33.0-33.9, adult: Secondary | ICD-10-CM | POA: Insufficient documentation

## 2017-06-16 DIAGNOSIS — K449 Diaphragmatic hernia without obstruction or gangrene: Secondary | ICD-10-CM | POA: Insufficient documentation

## 2017-06-16 DIAGNOSIS — M797 Fibromyalgia: Secondary | ICD-10-CM | POA: Insufficient documentation

## 2017-06-16 DIAGNOSIS — I259 Chronic ischemic heart disease, unspecified: Secondary | ICD-10-CM

## 2017-06-16 DIAGNOSIS — I25110 Atherosclerotic heart disease of native coronary artery with unstable angina pectoris: Principal | ICD-10-CM | POA: Insufficient documentation

## 2017-06-16 DIAGNOSIS — Z811 Family history of alcohol abuse and dependence: Secondary | ICD-10-CM | POA: Insufficient documentation

## 2017-06-16 DIAGNOSIS — Z882 Allergy status to sulfonamides status: Secondary | ICD-10-CM | POA: Insufficient documentation

## 2017-06-16 DIAGNOSIS — Z8379 Family history of other diseases of the digestive system: Secondary | ICD-10-CM | POA: Insufficient documentation

## 2017-06-16 DIAGNOSIS — Z885 Allergy status to narcotic agent status: Secondary | ICD-10-CM | POA: Insufficient documentation

## 2017-06-16 DIAGNOSIS — S0990XA Unspecified injury of head, initial encounter: Secondary | ICD-10-CM

## 2017-06-16 DIAGNOSIS — Z8249 Family history of ischemic heart disease and other diseases of the circulatory system: Secondary | ICD-10-CM | POA: Insufficient documentation

## 2017-06-16 DIAGNOSIS — I700 Atherosclerosis of aorta: Secondary | ICD-10-CM | POA: Insufficient documentation

## 2017-06-16 DIAGNOSIS — M3313 Other dermatomyositis without myopathy: Secondary | ICD-10-CM | POA: Diagnosis present

## 2017-06-16 DIAGNOSIS — F17210 Nicotine dependence, cigarettes, uncomplicated: Secondary | ICD-10-CM | POA: Insufficient documentation

## 2017-06-16 DIAGNOSIS — M5410 Radiculopathy, site unspecified: Secondary | ICD-10-CM | POA: Diagnosis present

## 2017-06-16 DIAGNOSIS — Z720 Tobacco use: Secondary | ICD-10-CM

## 2017-06-16 DIAGNOSIS — I7300 Raynaud's syndrome without gangrene: Secondary | ICD-10-CM

## 2017-06-16 DIAGNOSIS — R0789 Other chest pain: Secondary | ICD-10-CM

## 2017-06-16 DIAGNOSIS — R7981 Abnormal blood-gas level: Secondary | ICD-10-CM | POA: Diagnosis present

## 2017-06-16 DIAGNOSIS — F325 Major depressive disorder, single episode, in full remission: Secondary | ICD-10-CM

## 2017-06-16 DIAGNOSIS — M549 Dorsalgia, unspecified: Secondary | ICD-10-CM | POA: Insufficient documentation

## 2017-06-16 DIAGNOSIS — Z881 Allergy status to other antibiotic agents status: Secondary | ICD-10-CM | POA: Insufficient documentation

## 2017-06-16 DIAGNOSIS — K224 Dyskinesia of esophagus: Secondary | ICD-10-CM

## 2017-06-16 DIAGNOSIS — M069 Rheumatoid arthritis, unspecified: Secondary | ICD-10-CM | POA: Insufficient documentation

## 2017-06-16 DIAGNOSIS — Z7982 Long term (current) use of aspirin: Secondary | ICD-10-CM | POA: Insufficient documentation

## 2017-06-16 DIAGNOSIS — Z888 Allergy status to other drugs, medicaments and biological substances status: Secondary | ICD-10-CM | POA: Insufficient documentation

## 2017-06-16 DIAGNOSIS — I4891 Unspecified atrial fibrillation: Secondary | ICD-10-CM | POA: Diagnosis present

## 2017-06-16 LAB — I-STAT TROPONIN, ED: TROPONIN I, POC: 0 ng/mL (ref 0.00–0.08)

## 2017-06-16 LAB — CBC
HCT: 40.5 % (ref 39.0–52.0)
Hemoglobin: 12.3 g/dL — ABNORMAL LOW (ref 13.0–17.0)
MCH: 26.7 pg (ref 26.0–34.0)
MCHC: 30.4 g/dL (ref 30.0–36.0)
MCV: 87.9 fL (ref 78.0–100.0)
PLATELETS: 273 10*3/uL (ref 150–400)
RBC: 4.61 MIL/uL (ref 4.22–5.81)
RDW: 16 % — ABNORMAL HIGH (ref 11.5–15.5)
WBC: 6.1 10*3/uL (ref 4.0–10.5)

## 2017-06-16 LAB — BASIC METABOLIC PANEL
Anion gap: 8 (ref 5–15)
BUN: 11 mg/dL (ref 6–20)
CHLORIDE: 101 mmol/L (ref 101–111)
CO2: 32 mmol/L (ref 22–32)
CREATININE: 1.16 mg/dL (ref 0.61–1.24)
Calcium: 8.7 mg/dL — ABNORMAL LOW (ref 8.9–10.3)
GFR calc non Af Amer: >60 mL/min (ref >60)
Glucose, Bld: 95 mg/dL (ref 65–99)
Potassium: 3.9 mmol/L (ref 3.5–5.1)
SODIUM: 141 mmol/L (ref 135–145)

## 2017-06-16 MED ORDER — ADULT MULTIVITAMIN W/MINERALS CH: 1.0000 | ORAL_TABLET | Freq: Every day | ORAL | Status: DC

## 2017-06-16 MED ORDER — GI COCKTAIL ~~LOC~~: 30.0000 mL | Freq: Four times a day (QID) | ORAL | Status: DC | PRN

## 2017-06-16 MED ORDER — FLUTICASONE FUROATE-VILANTEROL 200-25 MCG/INH IN AEPB: 1.0000 | INHALATION_SPRAY | Freq: Every day | RESPIRATORY_TRACT | Status: DC

## 2017-06-16 MED ORDER — ATORVASTATIN CALCIUM 40 MG PO TABS: 40.0000 mg | ORAL_TABLET | Freq: Every day | ORAL | Status: DC

## 2017-06-16 MED ORDER — ASPIRIN EC 81 MG PO TBEC: 81.0000 mg | DELAYED_RELEASE_TABLET | Freq: Every day | ORAL | Status: DC

## 2017-06-16 MED ORDER — TRAMADOL HCL 50 MG PO TABS: 50.0000 mg | ORAL_TABLET | Freq: Four times a day (QID) | ORAL | Status: DC | PRN

## 2017-06-16 MED ORDER — FUROSEMIDE 20 MG PO TABS: 20.0000 mg | ORAL_TABLET | Freq: Every day | ORAL | Status: DC

## 2017-06-16 MED ORDER — AMLODIPINE BESYLATE 5 MG PO TABS: 5.0000 mg | ORAL_TABLET | Freq: Every day | ORAL | Status: DC

## 2017-06-16 MED ORDER — ENOXAPARIN SODIUM 40 MG/0.4ML ~~LOC~~ SOLN: 40.0000 mg | SUBCUTANEOUS | Status: DC

## 2017-06-16 MED ORDER — IPRATROPIUM-ALBUTEROL 0.5-2.5 (3) MG/3ML IN SOLN: 3.0000 mL | Freq: Four times a day (QID) | RESPIRATORY_TRACT | Status: DC | PRN

## 2017-06-16 MED ORDER — ACETAMINOPHEN 325 MG PO TABS: 650.0000 mg | ORAL_TABLET | ORAL | Status: DC | PRN

## 2017-06-16 MED ORDER — ERGOCALCIFEROL 1.25 MG (50000 UT) PO CAPS: 50000.0000 [IU] | ORAL_CAPSULE | ORAL | Status: DC

## 2017-06-16 MED ORDER — LEVETIRACETAM 500 MG PO TABS: 500.0000 mg | ORAL_TABLET | Freq: Two times a day (BID) | ORAL | Status: DC

## 2017-06-16 MED ORDER — DIVALPROEX SODIUM 250 MG PO DR TAB: 750.0000 mg | DELAYED_RELEASE_TABLET | Freq: Every day | ORAL | Status: DC

## 2017-06-16 MED ORDER — ALPRAZOLAM 0.25 MG PO TABS: 1.0000 mg | ORAL_TABLET | Freq: Three times a day (TID) | ORAL | Status: DC

## 2017-06-16 MED ORDER — OXYBUTYNIN CHLORIDE 5 MG PO TABS: 15.0000 mg | ORAL_TABLET | Freq: Every day | ORAL | Status: DC

## 2017-06-16 MED ORDER — AMIODARONE HCL 200 MG PO TABS: 200.0000 mg | ORAL_TABLET | Freq: Every day | ORAL | Status: DC

## 2017-06-16 MED ORDER — RISPERIDONE 1 MG PO TABS: 1.0000 mg | ORAL_TABLET | Freq: Two times a day (BID) | ORAL | Status: DC

## 2017-06-16 MED ORDER — PANTOPRAZOLE SODIUM 40 MG PO TBEC: 40.0000 mg | DELAYED_RELEASE_TABLET | Freq: Every day | ORAL | Status: DC

## 2017-06-16 MED ORDER — ONDANSETRON HCL 4 MG/2ML IJ SOLN: 4.0000 mg | Freq: Four times a day (QID) | INTRAMUSCULAR | Status: DC | PRN

## 2017-06-16 MED ORDER — POTASSIUM CHLORIDE ER 20 MEQ PO TBCR: 20.0000 meq | EXTENDED_RELEASE_TABLET | Freq: Every day | ORAL | Status: DC

## 2017-06-16 MED ORDER — FENTANYL CITRATE (PF) 100 MCG/2ML IJ SOLN
50.0000 ug | Freq: Once | INTRAMUSCULAR | Status: AC
  Administered 2017-06-16: 50 ug via INTRAVENOUS
  Filled 2017-06-16: qty 2

## 2017-06-16 NOTE — ED Notes (Signed)
Lady Deutscher, MD notified patient would like to sign out AMA.

## 2017-06-16 NOTE — Care Management (Signed)
This is a no charge note  Pending admission per Dr. Leonides Schanz.  67 year old male with past medical history of conversion disorder, CAD, myocardial infarction, hypertension, hyperlipidemia, diabetes mellitus, COPD, asthma, stroke, GERD, seizure, depression, anxiety, who presents with frequent fall and chest pain.  Oxygen saturation 91% on room air.  Patient has shortness of breath.  CT of head and CT of C-spine are pending.  Troponin negative.  EKG showed nonspecific T wave change.  Patient is placed on telemetry bed observation.   Ivor Costa, MD  [facility] Pager [phone]  If 7PM-7AM, please contact night-coverage [email or web address] Password [email or web address] 06/16/2017, 6:07 AM

## 2017-06-16 NOTE — ED Notes (Signed)
Patient denies pain and is resting comfortably.  

## 2017-06-16 NOTE — ED Notes (Signed)
Pt has refused all labs. Would like [facility] called for ride home now. Explained to patient social work has not responded regarding PT consult. Patient states he would still like to leave and [facility] to be called now. Lady Deutscher, MD made aware.

## 2017-06-16 NOTE — Discharge Summary (Signed)
Physician Discharge Summary  Patrick Holt ZDG:[id number] DOB: [birth year] DOA: 06/16/2017   Patient opted to leave [facility] I was notified by the nurse at 10:15 AM.  I attempted to encourage the patient to stay but he states he must leave because he has to pay his bills and he is feeling better now.  Patient agrees to allow [place] to set up home health physical therapy. Will refer pt to palliative care as well as outpatient   PCP: [facility]  Admit date: 06/16/2017 Discharge date: 06/16/2017  Admitted From:home Disposition:  Left AMA  Recommendations for Outpatient Follow-up:  1. Follow up with PCP in 1-2 weeks 2. Please obtain BMP/CBC in one week 3. Please follow up on the following pending results:  Home Health:ED RN to contact Social worker to set up HHPT Equipment/Devices:none but may need home O2.  Refused to stay for evaluation  Discharge Condition: guarded to poor CODE STATUS:full Diet recommendation: Heart Healthy  Brief/Interim Summary: Patrick Holt is a 67 y.o. male with medical history significant of 67 year old male with past medical history of conversion disorder, schizoaffective disorder (deemed competent to refuse skilled nursing facility by psychiatry recently in January 2019), CAD, myocardial infarction, hypertension, hyperlipidemia, diabetes mellitus, COPD, asthma, stroke, GERD, seizure, depression, anxiety, who presents with frequent fall and chest pain. Oxygen saturation 91% on room air. Patient has shortness of breath.  He currently denies any chest pain states medication received in the ER made him feel better.  He did say that the pain was located in the left side of his chest and was sharp in nature but did not radiate.  He was unable to give any symptoms that made it worse however pain medication made it better.  It was constant in nature but now has resolved.  There is a remote documentation of pain medication seeking  behavior and he did receive some fentanyl.  He has chronic neck problems is followed by Patrick Holt from orthopedics for lumbar and cervical spine arthritis and fractures.  His cardiologist is Patrick Holt.  He had a myocardial perfusion imaging in 2015 and since that time was seen by Patrick Holt in 2016 who recommended cardiac catheterization but at the time there was concern about GI bleeding so that did not proceed.  Last EGD was in 2014 a colonoscopy in November 2018 was canceled.  She is an extraordinarily poor historian.  Patrick Holt Kitchen saw him he felt that he did have some coronary artery disease which was underlying that he would benefit from cardiac catheterization however we did not proceed due to questions of his ability to tolerate anticoagulation as he had recently had a GI bleed.  Given these histories I will plan to place the patient in overnight observation rule out myocardial infarction and cardiology consultation will be sought.    Echocardiogram 01/14/2017 shows EF of 65 to 70% with vigorous contractions and grade 1 diastolic dysfunction as well as severe pulmonary hypertension at 75 mmHg.  He is also noted to be having frequent falls at home.  He states that a company called [employer] which provides physical therapy was supposed to send a therapist named Patrick Holt out to his house.  Patrick Holt has called him on 4 occasions but has never made it out to the house.  Patient is trying to be compliant with the plan for physical therapy that was deemed at his last hospitalization but seems to be unable to obtain services necessary.  Currently denies nausea, vomiting, headache, blurry vision, rash, pruritus, dysuria, urinary frequency, diarrhea, constipation.   1.  Unstable angina: Patient will come into the hospital under observation we will provide serial enzymes and repeat EKG.  We will continue home pain medication regimen as EKG and troponins are unremarkable.  Will consult  cardiology for further evaluation management.  2.  COPD, pulmonary artery hypertension, and ongoing tobacco abuse in the face of hypoxemia: Patient has had multiple episodes where he comes into the hospital and has episodes of hypoxemia.  He had been on Lasix but this was discontinued when he had a recent admission and was dehydrated.  I believe that he would benefit from low-dose Lasix given his pulmonary hypertension and hypoxemia.  I also believe the patient should be checked for nocturnal desaturations and possibly sleep apnea as well given his body habitus.  I would recommend outpatient sleep study and nocturnal oxygen saturations obtained here in the hospital on room air.  3.  History of seizure disorder: We will check a valproic acid level.  4.  Frequent falls: Patient will need a physical therapy evaluation he has attempted to be compliant with recommendations for home physical therapy but his therapist has not been able to make it out to his apartment.  Will consult social services as well.  5.  History of radicular back pain, cervical spine pain and L1 and L2 vertebral fractures: Patient is followed by Patrick Holt from orthopedics.  Clearly he would benefit from physical therapy as I suspect his frequent falls are due to pain and/or issues related to the spinal problems.  6.  History of schizoaffective disorder bipolar disorder and depression.  Patient not currently depressed states he is taking his medications.  We will continue to follow.  7.  Anxiety state: Patient clearly anxious in the hospital I suspect his anxiety affects his perception of symptoms.  We will continue his home Xanax.      Discharge Diagnoses:  Principal Problem:   Unstable angina (HCC) Active Problems:   COPD (chronic obstructive pulmonary disease) (HCC)   Pulmonary artery hypertension (HCC)   Hyperlipidemia   Tobacco abuse   History of seizure disorder   Coronary atherosclerosis of native coronary  artery   Chronic venous insufficiency   Falls frequently   Low oxygen saturation   DEPRESSION, MAJOR   Anxiety state   Dermatomyositis (HCC)   Radicular low back pain   Atrial fibrillation (HCC)   Essential hypertension, benign   Radiculopathy of cervical region   Schizoaffective disorder, bipolar type ([place])   L1 and L2 vertebral fracture [facility])    Discharge Instructions  Discharge Instructions    Amb Referral to Palliative Care   Complete by:  As directed    Diet - low sodium heart healthy   Complete by:  As directed    Increase activity slowly   Complete by:  As directed        Allergies  Allergen Reactions  . Immune Globulins Other (See Comments)    Acute renal failure  . Ciprofloxacin Swelling  . Flagyl [Metronidazole] Swelling  . Lisinopril Diarrhea  . Sulfa Antibiotics Other (See Comments)    blisters  . Cefepime     Diffuse maculopapular rash when receiving both vanc and cefepime, timing unclear.  . Morphine And Related     Does not help with pain  . Requip [Ropinirole Hcl]     constipation  . Vancomycin     Diffuse maculopapular rash when  receiving both vanc/cefepime.  Timing unclear.    . Betamethasone Dipropionate Other (See Comments)    Unknown  . Bupropion Hcl Other (See Comments)    Unknown  . Clobetasol Other (See Comments)    Unknown  . Codeine Other (See Comments)    Does not help with pain  . Escitalopram Oxalate Other (See Comments)    Unknown  . Fluoxetine Hcl Other (See Comments)    Unknown  . Meclizine Rash  . Paroxetine Other (See Comments)    Unknown  . Penicillins Other (See Comments)    Unknown reaction Has patient had a PCN reaction causing immediate rash, facial/tongue/throat swelling, SOB or lightheadedness with hypotension: unknown Has patient had a PCN reaction causing severe rash involving mucus membranes or skin necrosis: unknown Has patient had a PCN reaction that required hospitalization unknown Has patient had a PCN  reaction occurring within the last 10 years: unknown If all of the above answers are "NO", then may proceed with Cephalosporin use. Unknown Unknown reactio  . Tacrolimus Other (See Comments)    Unknown  . Tetanus Toxoid Other (See Comments)    Unknown  . Tuberculin Purified Protein Derivative Other (See Comments)    Unknown    Consultations:     Procedures/Studies: Dg Chest 2 View  Result Date: 06/16/2017 CLINICAL DATA:  Chest pain for 12 hours radiating to the back. Previous history of heart attack and stroke. Falls over the last 2 days. EXAM: CHEST - 2 VIEW COMPARISON:  04/06/2017 FINDINGS: Shallow inspiration with atelectasis in the lung bases. Cardiac enlargement. Pulmonary vascularity is normal for technique. No focal consolidation. Small bilateral pleural effusions. No pneumothorax. Calcification of the aorta. Degenerative changes in the spine. IMPRESSION: Cardiac enlargement. Small bilateral pleural effusions with basilar atelectasis. Aortic atherosclerosis. Electronically Signed   By: Lucienne Capers M.D.   On: 06/16/2017 05:30   Ct Head Wo Contrast  Result Date: 06/16/2017 CLINICAL DATA:  Falls over the last 2 days. EXAM: CT HEAD WITHOUT CONTRAST CT CERVICAL SPINE WITHOUT CONTRAST TECHNIQUE: Multidetector CT imaging of the head and cervical spine was performed following the standard protocol without intravenous contrast. Multiplanar CT image reconstructions of the cervical spine were also generated. COMPARISON:  CT head 03/03/2017. CT cervical spine 12/17/2016 FINDINGS: CT HEAD FINDINGS Brain: Diffuse cerebral atrophy. Mild ventricular dilatation consistent with central atrophy. Low-attenuation changes throughout the deep white matter consistent small vessel ischemia. No mass-effect or midline shift. No abnormal extra-axial fluid collections. Gray-white matter junctions are distinct. Basal cisterns are not effaced. No acute intracranial hemorrhage. Vascular: Intracranial arterial  vascular calcifications are present. Skull: Calvarium appears intact. Sinuses/Orbits: Paranasal sinuses and mastoid air cells are clear. Other: None. CT CERVICAL SPINE FINDINGS Alignment: Normal alignment of the cervical spine. Skull base and vertebrae: Skull base appears intact. No vertebral compression deformities. No focal bone lesion or bone destruction. Old appearing ununited fracture of the T1 spinous process. Soft tissues and spinal canal: No prevertebral soft tissue swelling. No paraspinal soft tissue mass or infiltration. Disc levels: Mild degenerative changes demonstrated at C5-6 level. Degenerative changes also at C1-2. degenerative changes in the facet joints. Upper chest: Scarring suggested in the lung apices. Other: None. IMPRESSION: 1. No acute intracranial abnormalities. Chronic atrophy and small vessel ischemic changes. 2. Normal alignment of the cervical spine. Mild degenerative changes. Old appearing ununited fracture of the T1 spinous process. No acute displaced fractures identified. Electronically Signed   By: Lucienne Capers M.D.   On: 06/16/2017 06:17  Ct Cervical Spine Wo Contrast  Result Date: 06/16/2017 CLINICAL DATA:  Falls over the last 2 days. EXAM: CT HEAD WITHOUT CONTRAST CT CERVICAL SPINE WITHOUT CONTRAST TECHNIQUE: Multidetector CT imaging of the head and cervical spine was performed following the standard protocol without intravenous contrast. Multiplanar CT image reconstructions of the cervical spine were also generated. COMPARISON:  CT head 03/03/2017. CT cervical spine 12/17/2016 FINDINGS: CT HEAD FINDINGS Brain: Diffuse cerebral atrophy. Mild ventricular dilatation consistent with central atrophy. Low-attenuation changes throughout the deep white matter consistent small vessel ischemia. No mass-effect or midline shift. No abnormal extra-axial fluid collections. Gray-white matter junctions are distinct. Basal cisterns are not effaced. No acute intracranial hemorrhage.  Vascular: Intracranial arterial vascular calcifications are present. Skull: Calvarium appears intact. Sinuses/Orbits: Paranasal sinuses and mastoid air cells are clear. Other: None. CT CERVICAL SPINE FINDINGS Alignment: Normal alignment of the cervical spine. Skull base and vertebrae: Skull base appears intact. No vertebral compression deformities. No focal bone lesion or bone destruction. Old appearing ununited fracture of the T1 spinous process. Soft tissues and spinal canal: No prevertebral soft tissue swelling. No paraspinal soft tissue mass or infiltration. Disc levels: Mild degenerative changes demonstrated at C5-6 level. Degenerative changes also at C1-2. degenerative changes in the facet joints. Upper chest: Scarring suggested in the lung apices. Other: None. IMPRESSION: 1. No acute intracranial abnormalities. Chronic atrophy and small vessel ischemic changes. 2. Normal alignment of the cervical spine. Mild degenerative changes. Old appearing ununited fracture of the T1 spinous process. No acute displaced fractures identified. Electronically Signed   By: Lucienne Capers M.D.   On: 06/16/2017 06:17      Subjective:   Discharge Exam: Vitals:   06/16/17 0930 06/16/17 1000  BP: 120/65 103/71  Pulse: 78 78  Resp: 13 14  Temp:    SpO2: 93% 91%   Vitals:   06/16/17 0900 06/16/17 0915 06/16/17 0930 06/16/17 1000  BP: 105/63 (!) 87/53 120/65 103/71  Pulse: 77 77 78 78  Resp: 15 14 13 14   Temp:      TempSrc:      SpO2: 92% 92% 93% 91%  Weight:      Height:        General: Pt is alert, awake, not in acute distress Cardiovascular: RRR, S1/S2 +, no rubs, no gallops Respiratory: CTA bilaterally, no wheezing, no rhonchi Abdominal: Soft, NT, ND, bowel sounds + Extremities: no edema, no cyanosis    The results of significant diagnostics from this hospitalization (including imaging, microbiology, ancillary and laboratory) are listed below for reference.     Microbiology: No results  found for this or any previous visit (from the past 240 hour(s)).   Labs: BNP (last 3 results) Recent Labs    03/03/17 1709 04/06/17 1109  BNP 77.2 40.9   Basic Metabolic Panel: Recent Labs  Lab 06/16/17 0501  NA 141  K 3.9  CL 101  CO2 32  GLUCOSE 95  BUN 11  CREATININE 1.16  CALCIUM 8.7*   Liver Function Tests: No results for input(s): AST, ALT, ALKPHOS, BILITOT, PROT, ALBUMIN in the last 168 hours. No results for input(s): LIPASE, AMYLASE in the last 168 hours. No results for input(s): AMMONIA in the last 168 hours. CBC: Recent Labs  Lab 06/16/17 0501  WBC 6.1  HGB 12.3*  HCT 40.5  MCV 87.9  PLT 273   Cardiac Enzymes: No results for input(s): CKTOTAL, CKMB, CKMBINDEX, TROPONINI in the last 168 hours. BNP: Invalid input(s): POCBNP CBG: No results for  input(s): GLUCAP in the last 168 hours. D-Dimer No results for input(s): DDIMER in the last 72 hours. Hgb A1c No results for input(s): HGBA1C in the last 72 hours. Lipid Profile No results for input(s): CHOL, HDL, LDLCALC, TRIG, CHOLHDL, LDLDIRECT in the last 72 hours. Thyroid function studies No results for input(s): TSH, T4TOTAL, T3FREE, THYROIDAB in the last 72 hours.  Invalid input(s): FREET3 Anemia work up No results for input(s): VITAMINB12, FOLATE, FERRITIN, TIBC, IRON, RETICCTPCT in the last 72 hours. Urinalysis    Component Value Date/Time   COLORURINE YELLOW 04/06/2017 1254   APPEARANCEUR CLEAR 04/06/2017 1254   LABSPEC 1.041 (H) 04/06/2017 1254   PHURINE 7.0 04/06/2017 1254   GLUCOSEU NEGATIVE 04/06/2017 1254   HGBUR NEGATIVE 04/06/2017 1254   HGBUR negative 12/11/2009 1356   BILIRUBINUR NEGATIVE 04/06/2017 1254   BILIRUBINUR MODERATE 06/05/2015 1407   KETONESUR 5 (A) 04/06/2017 1254   PROTEINUR NEGATIVE 04/06/2017 1254   UROBILINOGEN 2.0 06/05/2015 1407   UROBILINOGEN 1.0 12/30/2014 1830   NITRITE NEGATIVE 04/06/2017 1254   LEUKOCYTESUR NEGATIVE 04/06/2017 1254   Sepsis Labs Invalid  input(s): PROCALCITONIN,  WBC,  LACTICIDVEN Microbiology No results found for this or any previous visit (from the past 240 hour(s)).    SIGNED:   Lady Deutscher, MD  [facility] 06/16/2017, 10:37 AM Pager   If 7PM-7AM, please contact night-coverage [email or web address] Password [email or web address]

## 2017-06-16 NOTE — Progress Notes (Signed)
Requiring fax from outside facility to obtain correct prior to admission medication list. Will verify and send medications when completed.

## 2017-06-16 NOTE — ED Notes (Signed)
Lady Deutscher, MD at bedside talking with patient.

## 2017-06-16 NOTE — ED Notes (Signed)
Pt complains of chest pain that started on 06/15/17 @ 1700.

## 2017-06-16 NOTE — Progress Notes (Signed)
CSW consulted for [facility] PT to be reset up for pt. CSW spoke with pt at bedside and was informed that pt is wanting to go home. CSW sought further information on pt's reasons to go. Pt expressed that pt is on the verge of loosing home and has to fill out paperwork for [employer] in order to keep pt's housing. CSW offered to follow up with whomever pt was needing CSW to in order to keep housing and pt expressed "I'd rather go home and just fill out the paperwork myself". CSW acknowledges pt's right to do this and expressed that CSW would reach out RNCM to see about getting HHPT restarted for pt. There are no further CSW needs at this time. CSW will sign off.    Patrick Holt. Patrick Holt, MSW, [place] [facility] Clinical Social Worker [phone]

## 2017-06-16 NOTE — ED Notes (Signed)
Notified pharmacy of missing medications

## 2017-06-16 NOTE — ED Provider Notes (Signed)
TIME SEEN: 4:52 AM  CHIEF COMPLAINT: Chest pain, multiple falls  HPI: Patient is a 67 year old male with history of hypertension, hyperlipidemia, COPD, bipolar disorder, diabetes who presented to the emergency department with complaints of chest pain.  Reports pain is been intermittent for the past 12 hours and is described as a pressure, heaviness that radiates in the left arm.  Has had associated shortness of breath but no nausea, vomiting, diaphoresis or dizziness.  Reports he has had a history of 4 MIs and 2 strokes and states this feels similar to his previous MI.  Reports his last catheterization was many years ago.  Denies having any stents.  Does have history of atrial fibrillation but denies being on blood thinners.  Does also state that he has had multiple falls over the past week.  Does report that he has had his head with these falls.  He states he is not sure why he falls frequently.  He thinks that he loses his balance.  Has chronic back pain.  Denies any other new injury from his fall.  Denies any worsening back pain.  Denies any numbness or focal weakness.  ROS: See HPI Constitutional: no fever  Eyes: no drainage  ENT: no runny nose   Cardiovascular:  no chest pain  Resp: no SOB  GI: no vomiting GU: no dysuria Integumentary: no rash  Allergy: no hives  Musculoskeletal: no leg swelling  Neurological: no slurred speech ROS otherwise negative  PAST MEDICAL HISTORY/PAST SURGICAL HISTORY:  Past Medical History:  Diagnosis Date  . Anemia   . Anxiety   . Asthma   . Atrial fibrillation ([place]) 01/2012   with RVR  . Bipolar 1 disorder ([place])   . Cancer ([place])   . Collagen vascular disease ([place])   . Conversion disorder 06/2010  . COPD (chronic obstructive pulmonary disease) ([place])   . Dermatomyositis ([place])   . Dermatomyositis ([place])   . Diverticulitis   . Diverticulosis of colon   . Dysrhythmia    "irregular" (11/15/2012)  . Esophageal dysmotility   . Esophageal stricture   .  Fibromyalgia   . Gastritis   . GERD (gastroesophageal reflux disease)   . Hand fracture, right    sept 13, 2017, still wearing splint as og 01-10-2017  . Headache(784.0)    "severe; get them often" (11/15/2012)  . Hiatal hernia   . History of narcotic addiction ([place])   . Hx of adenomatous colonic polyps   . Hyperlipidemia   . Hypertension   . Internal hemorrhoids   . Ischemic heart disease   . Major depression    with acute psychotic break in 06/2010  . Myocardial infarct ([place])    mulitple (1999, 2000, 2004)  . Narcotic dependence ([place])   . Nephrolithiasis   . Obesity   . OCD (obsessive compulsive disorder)   . Otosclerosis   . Paroxysmal A-fib ([place])   . Peripheral neuropathy   . Raynaud's disease   . Renal insufficiency   . Rheumatoid arthritis(714.0)   . Sarcoidosis   . Seizures ([place])   . Subarachnoid hemorrhage ([place]) 01/2012   with subdural  hematoma.   . Type II diabetes mellitus ([place])   . Urge incontinence   . Vertigo     MEDICATIONS:  Prior to Admission medications   Medication Sig Start Date End Date Taking? Authorizing Provider  ALPRAZolam Duanne Moron) 1 MG tablet Take 1 mg by mouth 3 (three) times daily. 01/10/17   [provider]  amiodarone ([place])  200 MG tablet Take 1 tablet (200 mg total) by mouth daily. 02/10/17   Alma Friendly, MD  amLODipine (NORVASC) 5 MG tablet Take 1 tablet (5 mg total) by mouth daily. 01/19/17   Velvet Bathe, MD  aspirin EC 81 MG tablet Take 1 tablet (81 mg total) by mouth daily. 10/17/15   [name], MD  atorvastatin (LIPITOR) 40 MG tablet Take 1 tablet (40 mg total) by mouth daily. 01/19/17   Velvet Bathe, MD  budesonide-formoterol [facility]) 160-4.5 MCG/ACT inhaler Inhale 2 puffs into the lungs 2 (two) times daily. 01/19/17   Velvet Bathe, MD  divalproex (DEPAKOTE) 250 MG DR tablet Take 750 mg by mouth daily. 03/16/17   [provider]  ergocalciferol (VITAMIN D2) 50000 units capsule Take 50,000 Units by  mouth once a week.    [provider]  esomeprazole (NEXIUM) 40 MG capsule Take 40 mg by mouth daily. 07/26/16   [provider]  ibuprofen (ADVIL,MOTRIN) 200 MG tablet Take 200 mg by mouth every 6 (six) hours as needed for headache or moderate pain.    [provider]  ipratropium-albuterol (DUONEB) 0.5-2.5 (3) MG/3ML SOLN Inhale 3 mLs into the lungs every 6 (six) hours as needed. Patient taking differently: Inhale 3 mLs into the lungs every 6 (six) hours as needed (for breathing).  11/17/15   Elgergawy, Silver Huguenin, MD  levETIRAcetam (KEPPRA) 500 MG tablet Take 500 mg by mouth 2 (two) times daily.    [provider]  loperamide (IMODIUM) 2 MG capsule Take 2 mg by mouth as needed for diarrhea or loose stools.    [provider]  Multiple Vitamin (MULTIVITAMIN WITH MINERALS) TABS tablet Take 1 tablet by mouth daily. 10/17/15   [name], MD  oxybutynin (DITROPAN) 5 MG tablet Take 15 mg by mouth daily.  07/26/16   [provider]  potassium chloride 20 MEQ TBCR Take 20 mEq by mouth daily. 02/23/17   Domenic Polite, MD  risperiDONE (RISPERDAL) 1 MG tablet Take 1 mg by mouth 2 (two) times daily. 03/25/17   [provider]  traMADol (ULTRAM) 50 MG tablet Take 50 mg by mouth every 6 (six) hours as needed for moderate pain.    [provider]    ALLERGIES:  Allergies  Allergen Reactions  . Immune Globulins Other (See Comments)    Acute renal failure  . Ciprofloxacin Swelling  . Flagyl [Metronidazole] Swelling  . Lisinopril Diarrhea  . Sulfa Antibiotics Other (See Comments)    blisters  . Cefepime     Diffuse maculopapular rash when receiving both vanc and cefepime, timing unclear.  . Morphine And Related     Does not help with pain  . Requip [Ropinirole Hcl]     constipation  . Vancomycin     Diffuse maculopapular rash when receiving both vanc/cefepime.  Timing unclear.    . Betamethasone Dipropionate Other (See Comments)     Unknown  . Bupropion Hcl Other (See Comments)    Unknown  . Clobetasol Other (See Comments)    Unknown  . Codeine Other (See Comments)    Does not help with pain  . Escitalopram Oxalate Other (See Comments)    Unknown  . Fluoxetine Hcl Other (See Comments)    Unknown  . Meclizine Rash  . Paroxetine Other (See Comments)    Unknown  . Penicillins Other (See Comments)    Unknown reaction Has patient had a PCN reaction causing immediate rash, facial/tongue/throat swelling, SOB or  lightheadedness with hypotension: unknown Has patient had a PCN reaction causing severe rash involving mucus membranes or skin necrosis: unknown Has patient had a PCN reaction that required hospitalization unknown Has patient had a PCN reaction occurring within the last 10 years: unknown If all of the above answers are "NO", then may proceed with Cephalosporin use. Unknown Unknown reactio  . Tacrolimus Other (See Comments)    Unknown  . Tetanus Toxoid Other (See Comments)    Unknown  . Tuberculin Purified Protein Derivative Other (See Comments)    Unknown    SOCIAL HISTORY:  Social History   Tobacco Use  . Smoking status: Former Smoker    Packs/day: 0.50    Years: 30.00    Pack years: 15.00    Types: Cigarettes    Last attempt to quit: 12/15/2014    Years since quitting: 2.5  . Smokeless tobacco: Never Used  . Tobacco comment: 3 cigs a day  Substance Use Topics  . Alcohol use: No    Alcohol/week: 0.0 oz    Comment: Alcohol stopped in September of 2014    FAMILY HISTORY: Family History  Problem Relation Age of Onset  . Alcohol abuse Mother   . Depression Mother   . Heart disease Mother   . Diabetes Mother   . Stroke Mother   . Coronary artery disease Father   . Diabetes Other        1/2 brother  . Hepatitis Brother     EXAM: BP 104/70   Pulse 71   Temp 98.2 F (36.8 C) (Oral)   Resp 16   Ht 5\' 3"  (1.6 m)   Wt 86.2 kg (190 lb)   SpO2 97%   BMI 33.66 kg/m  CONSTITUTIONAL:  Alert and oriented and responds appropriately to questions.  Chronically ill-appearing, disgruntled; GCS 15 HEAD: Normocephalic; atraumatic EYES: Conjunctivae clear, PERRL, EOMI ENT: normal nose; no rhinorrhea; moist mucous membranes; pharynx without lesions noted; no dental injury; no septal hematoma NECK: Supple, no meningismus, no LAD; no midline spinal tenderness, step-off or deformity; trachea midline CARD: RRR; S1 and S2 appreciated; no murmurs, no clicks, no rubs, no gallops RESP: Normal chest excursion without splinting or tachypnea; breath sounds clear and equal bilaterally; no wheezes, no rhonchi, no rales; no hypoxia or respiratory distress CHEST:  chest wall stable, no crepitus or ecchymosis or deformity, nontender to palpation; no flail chest ABD/GI: Normal bowel sounds; non-distended; soft, non-tender, no rebound, no guarding; no ecchymosis or other lesions noted PELVIS:  stable, nontender to palpation BACK:  The back appears normal and is non-tender to palpation, there is no CVA tenderness; no midline spinal tenderness, step-off or deformity EXT: Normal ROM in all joints; non-tender to palpation; no edema; normal capillary refill; no cyanosis, no bony tenderness or bony deformity of patient's extremities, no joint effusion, compartments are soft, extremities are warm and well-perfused, no ecchymosis SKIN: Normal color for age and race; warm NEURO: Moves all extremities equally, normal sensation diffusely, cranial nerves II through XII intact, normal speech PSYCH: The patient's mood and manner are appropriate. Grooming and personal hygiene are appropriate.  MEDICAL DECISION MAKING: Patient here with complaints of chest pain.  Has significant risk factors for ACS and reports history of previous MI.  States his last catheterization was years ago.  Given full dose aspirin by EMS.  EKG shows no new ischemic abnormality.  Plan is to obtain labs, chest x-ray.  Patient also complaining of  multiple falls with head injury.  Will  obtain CT of his head and cervical spine.  No other acute sign of trauma on examination.  He is currently neurologically intact.  Will give fentanyl for pain control.  Does have slightly soft blood pressure with systolic in the 540G.  Will hold on nitroglycerin at this time.  ED PROGRESS: Troponin negative.  Chest x-ray shows small bilateral pleural effusions but no other acute abnormality.  I have reviewed the CT of the head and cervical spine I do not see any acute abnormality.  Will discuss with hospitalist.  6:06 AM Discussed patient's case with hospitalist, Dr. Blaine Hamper.  I have recommended admission and patient (and family if present) agree with this plan. Admitting physician will place admission orders.   I reviewed all nursing notes, vitals, pertinent previous records, EKGs, lab and urine results, imaging (as available).     EKG Interpretation  Date/Time:  Thursday Jun 16 2017 04:43:57 EDT Ventricular Rate:  77 PR Interval:    QRS Duration: 93 QT Interval:  390 QTC Calculation: 442 R Axis:   1 Text Interpretation:  Sinus rhythm Low voltage, precordial leads No significant change since last tracing Confirmed by Ward, Cyril Mourning [phone]) on 06/16/2017 5:08:32 AM          Ward, Delice Bison, DO 06/16/17 [id number]

## 2017-06-16 NOTE — H&P (Signed)
History and Physical   UNASSIGNED MEDICAL ADMISSION   [name] WTU:[id number] DOB: [birth year] DOA: 06/16/2017  PCP: Medicine, [facility]  Patient coming from: [facility]  I have personally briefly reviewed patient's old medical records in [place]  Chief Complaint: Chest pain and frequent falls at home.  HPI: Patrick Holt is a 67 y.o. male with medical history significant of 67 year old male with past medical history of conversion disorder, schizoaffective disorder (deemed competent to refuse skilled nursing facility by psychiatry recently in January 2019), CAD, myocardial infarction, hypertension, hyperlipidemia, diabetes mellitus, COPD, asthma, stroke, GERD, seizure, depression, anxiety, who presents with frequent fall and chest pain.  Oxygen saturation 91% on room air.  Patient has shortness of breath.  He currently denies any chest pain states medication received in the ER made him feel better.  He did say that the pain was located in the left side of his chest and was sharp in nature but did not radiate.  He was unable to give any symptoms that made it worse however pain medication made it better.  It was constant in nature but now has resolved.  There is a remote documentation of pain medication seeking behavior and he did receive some fentanyl.  He has chronic neck problems is followed by Dr. Burney Gauze from orthopedics for lumbar and cervical spine arthritis and fractures.  His cardiologist is Dr. Dorris Carnes.  He had a myocardial perfusion imaging in 2015 and since that time was seen by Dr. Karena Addison in 2016 who recommended cardiac catheterization but at the time there was concern about GI bleeding so that did not proceed.  Last EGD was in 2014 a colonoscopy in November 2018 was canceled.  She is an extraordinarily poor historian.  Dr. Marland Kitchen saw him he felt that he did have some coronary artery disease which was underlying that  he would benefit from cardiac catheterization however we did not proceed due to questions of his ability to tolerate anticoagulation as he had recently had a GI bleed.  Given these histories I will plan to place the patient in overnight observation rule out myocardial infarction and cardiology consultation will be sought.    Echocardiogram 01/14/2017 shows EF of 65 to 70% with vigorous contractions and grade 1 diastolic dysfunction as well as severe pulmonary hypertension at 75 mmHg.  He is also noted to be having frequent falls at home.  He states that a company called [employer] which provides physical therapy was supposed to send a therapist named Legrand Como out to his house.  Legrand Como has called him on 4 occasions but has never made it out to the house.  Patient is trying to be compliant with the plan for physical therapy that was deemed at his last hospitalization but seems to be unable to obtain services necessary.  Currently denies nausea, vomiting, headache, blurry vision, rash, pruritus, dysuria, urinary frequency, diarrhea, constipation.  Review of Systems: As per HPI otherwise all other systems reviewed and  negative.   Past Medical History:  Diagnosis Date  . Anemia   . Anxiety   . Asthma   . Atrial fibrillation ([place]) 01/2012   with RVR  . Bipolar 1 disorder ([place])   . Cancer ([place])   . Collagen vascular disease ([place])   . Conversion disorder 06/2010  . COPD (chronic obstructive pulmonary disease) ([place])   . Dermatomyositis ([place])   . Dermatomyositis ([place])   . Diverticulitis   . Diverticulosis of colon   .  Dysrhythmia    "irregular" (11/15/2012)  . Esophageal dysmotility   . Esophageal stricture   . Fibromyalgia   . Gastritis   . GERD (gastroesophageal reflux disease)   . Hand fracture, right    sept 13, 2017, still wearing splint as og 01-10-2017  . Headache(784.0)    "severe; get them often" (11/15/2012)  . Hiatal hernia   . History of narcotic addiction ([place])   . Hx of  adenomatous colonic polyps   . Hyperlipidemia   . Hypertension   . Internal hemorrhoids   . Ischemic heart disease   . Major depression    with acute psychotic break in 06/2010  . Myocardial infarct ([place])    mulitple (1999, 2000, 2004)  . Narcotic dependence ([place])   . Nephrolithiasis   . Obesity   . OCD (obsessive compulsive disorder)   . Otosclerosis   . Paroxysmal A-fib ([place])   . Peripheral neuropathy   . Raynaud's disease   . Renal insufficiency   . Rheumatoid arthritis(714.0)   . Sarcoidosis   . Seizures ([place])   . Subarachnoid hemorrhage ([place]) 01/2012   with subdural  hematoma.   . Type II diabetes mellitus ([place])   . Urge incontinence   . Vertigo     Past Surgical History:  Procedure Laterality Date  . BACK SURGERY    . CARDIAC CATHETERIZATION    . CARPAL TUNNEL RELEASE Bilateral   . CATARACT EXTRACTION W/ INTRAOCULAR LENS IMPLANT Left   . COLONOSCOPY N/A 09/27/2012   Procedure: COLONOSCOPY;  Surgeon: Lafayette Dragon, MD;  Location: [facility];  Service: Endoscopy;  Laterality: N/A;  . ESOPHAGOGASTRODUODENOSCOPY N/A 09/27/2012   Procedure: ESOPHAGOGASTRODUODENOSCOPY (EGD);  Surgeon: Lafayette Dragon, MD;  Location: [facility];  Service: Endoscopy;  Laterality: N/A;  . KNEE ARTHROSCOPY W/ MENISCAL REPAIR Left 2009  . LUMBAR DISC SURGERY    . LYMPH NODE DISSECTION Right 1970's   "neck; dr thought I had Hodgkins; turned out to be sarcoidosis" (11/15/2012)  . squamous papilloma   2010   removed by Dr. Constance Holster ENT, noted on tongue  . TONSILLECTOMY       reports that he has been smoking cigarettes.  He has a 15.00 pack-year smoking history. He has never used smokeless tobacco. He reports that he does not drink alcohol or use drugs.  Allergies  Allergen Reactions  . Immune Globulins Other (See Comments)    Acute renal failure  . Ciprofloxacin Swelling  . Flagyl [Metronidazole] Swelling  . Lisinopril Diarrhea  . Sulfa Antibiotics Other (See Comments)    blisters  .  Cefepime     Diffuse maculopapular rash when receiving both vanc and cefepime, timing unclear.  . Morphine And Related     Does not help with pain  . Requip [Ropinirole Hcl]     constipation  . Vancomycin     Diffuse maculopapular rash when receiving both vanc/cefepime.  Timing unclear.    . Betamethasone Dipropionate Other (See Comments)    Unknown  . Bupropion Hcl Other (See Comments)    Unknown  . Clobetasol Other (See Comments)    Unknown  . Codeine Other (See Comments)    Does not help with pain  . Escitalopram Oxalate Other (See Comments)    Unknown  . Fluoxetine Hcl Other (See Comments)    Unknown  . Meclizine Rash  . Paroxetine Other (See Comments)    Unknown  . Penicillins Other (See Comments)    Unknown reaction Has patient had  a PCN reaction causing immediate rash, facial/tongue/throat swelling, SOB or lightheadedness with hypotension: unknown Has patient had a PCN reaction causing severe rash involving mucus membranes or skin necrosis: unknown Has patient had a PCN reaction that required hospitalization unknown Has patient had a PCN reaction occurring within the last 10 years: unknown If all of the above answers are "NO", then may proceed with Cephalosporin use. Unknown Unknown reactio  . Tacrolimus Other (See Comments)    Unknown  . Tetanus Toxoid Other (See Comments)    Unknown  . Tuberculin Purified Protein Derivative Other (See Comments)    Unknown    Family History  Problem Relation Age of Onset  . Alcohol abuse Mother   . Depression Mother   . Heart disease Mother   . Diabetes Mother   . Stroke Mother   . Coronary artery disease Father   . Diabetes Other        1/2 brother  . Hepatitis Brother      Prior to Admission medications   Medication Sig Start Date End Date Taking? Authorizing Provider  ALPRAZolam Duanne Moron) 1 MG tablet Take 1 mg by mouth 3 (three) times daily. 01/10/17   [provider]  amiodarone (PACERONE) 200 MG tablet Take  1 tablet (200 mg total) by mouth daily. 02/10/17   Alma Friendly, MD  amLODipine (NORVASC) 5 MG tablet Take 1 tablet (5 mg total) by mouth daily. 01/19/17   Velvet Bathe, MD  aspirin EC 81 MG tablet Take 1 tablet (81 mg total) by mouth daily. 10/17/15   [name], MD  atorvastatin (LIPITOR) 40 MG tablet Take 1 tablet (40 mg total) by mouth daily. 01/19/17   Velvet Bathe, MD  budesonide-formoterol [facility]) 160-4.5 MCG/ACT inhaler Inhale 2 puffs into the lungs 2 (two) times daily. 01/19/17   Velvet Bathe, MD  divalproex (DEPAKOTE) 250 MG DR tablet Take 750 mg by mouth daily. 03/16/17   [provider]  ergocalciferol (VITAMIN D2) 50000 units capsule Take 50,000 Units by mouth once a week.    [provider]  esomeprazole (NEXIUM) 40 MG capsule Take 40 mg by mouth daily. 07/26/16   [provider]  ibuprofen (ADVIL,MOTRIN) 200 MG tablet Take 200 mg by mouth every 6 (six) hours as needed for headache or moderate pain.    [provider]  ipratropium-albuterol (DUONEB) 0.5-2.5 (3) MG/3ML SOLN Inhale 3 mLs into the lungs every 6 (six) hours as needed. Patient taking differently: Inhale 3 mLs into the lungs every 6 (six) hours as needed (for breathing).  11/17/15   Elgergawy, Silver Huguenin, MD  levETIRAcetam (KEPPRA) 500 MG tablet Take 500 mg by mouth 2 (two) times daily.    [provider]  loperamide (IMODIUM) 2 MG capsule Take 2 mg by mouth as needed for diarrhea or loose stools.    [provider]  Multiple Vitamin (MULTIVITAMIN WITH MINERALS) TABS tablet Take 1 tablet by mouth daily. 10/17/15   [name], MD  oxybutynin (DITROPAN) 5 MG tablet Take 15 mg by mouth daily.  07/26/16   [provider]  potassium chloride 20 MEQ TBCR Take 20 mEq by mouth daily. 02/23/17   Domenic Polite, MD  risperiDONE (RISPERDAL) 1 MG tablet Take 1 mg by mouth 2 (two) times daily. 03/25/17   [provider]  traMADol (ULTRAM) 50 MG tablet Take 50 mg  by mouth every 6 (six) hours as needed for moderate pain.    [provider]  Physical Exam: Vitals:   06/16/17 0615 06/16/17 0630 06/16/17 0645 06/16/17 0700  BP: 105/60 (!) 122/55 (!) 119/59 108/62  Pulse: (!) 55 81 88 89  Resp: (!) 7 (!) 9 11 11   Temp:      TempSrc:      SpO2: (!) 77% (!) 86% (!) 86% (!) 88%  Weight:      Height:       .TCS Constitutional: NAD, calm, comfortable Vitals:   06/16/17 0615 06/16/17 0630 06/16/17 0645 06/16/17 0700  BP: 105/60 (!) 122/55 (!) 119/59 108/62  Pulse: (!) 55 81 88 89  Resp: (!) 7 (!) 9 11 11   Temp:      TempSrc:      SpO2: (!) 77% (!) 86% (!) 86% (!) 88%  Weight:      Height:       Eyes: PERRL, lids and conjunctivae normal ENMT: Mucous membranes are moist. Posterior pharynx clear of any exudate or lesions.Normal dentition.  Neck: normal, supple, no masses, no thyromegaly Respiratory: clear to auscultation bilaterally, no wheezing, no crackles. Normal respiratory effort. No accessory muscle use.  Cardiovascular: Regular rate and rhythm, no murmurs / rubs / gallops. No extremity edema. 2+ pedal pulses. No carotid bruits.  Abdomen: no tenderness, no masses palpated. No hepatosplenomegaly. Bowel sounds positive.  Musculoskeletal: no clubbing / cyanosis. No joint deformity upper and lower extremities. Good ROM, no contractures. Normal muscle tone.  Skin: Lateral erythema consistent with chronic venous stasis on lower extremities, lesions, ulcers. No induration Neurologic: CN 2-12 grossly intact. Sensation intact, DTR normal. Strength 5/5 in all 4.  Psychiatric: Normal judgment and insight. Alert and oriented x 3. Normal mood.    Labs on Admission: I have personally reviewed following labs and imaging studies  CBC: Recent Labs  Lab 06/16/17 0501  WBC 6.1  HGB 12.3*  HCT 40.5  MCV 87.9  PLT 824   Basic Metabolic Panel: Recent Labs  Lab 06/16/17 0501  NA 141  K 3.9  CL 101  CO2 32  GLUCOSE 95  BUN 11    CREATININE 1.16  CALCIUM 8.7*   GFR: Estimated Creatinine Clearance: 59.1 mL/min (by C-G formula based on SCr of 1.16 mg/dL). Urine analysis:    Component Value Date/Time   COLORURINE YELLOW 04/06/2017 1254   APPEARANCEUR CLEAR 04/06/2017 1254   LABSPEC 1.041 (H) 04/06/2017 1254   PHURINE 7.0 04/06/2017 1254   GLUCOSEU NEGATIVE 04/06/2017 1254   HGBUR NEGATIVE 04/06/2017 1254   HGBUR negative 12/11/2009 1356   BILIRUBINUR NEGATIVE 04/06/2017 1254   BILIRUBINUR MODERATE 06/05/2015 1407   KETONESUR 5 (A) 04/06/2017 1254   PROTEINUR NEGATIVE 04/06/2017 1254   UROBILINOGEN 2.0 06/05/2015 1407   UROBILINOGEN 1.0 12/30/2014 1830   NITRITE NEGATIVE 04/06/2017 1254   LEUKOCYTESUR NEGATIVE 04/06/2017 1254    Radiological Exams on Admission: Dg Chest 2 View  Result Date: 06/16/2017 CLINICAL DATA:  Chest pain for 12 hours radiating to the back. Previous history of heart attack and stroke. Falls over the last 2 days. EXAM: CHEST - 2 VIEW COMPARISON:  04/06/2017 FINDINGS: Shallow inspiration with atelectasis in the lung bases. Cardiac enlargement. Pulmonary vascularity is normal for technique. No focal consolidation. Small bilateral pleural effusions. No pneumothorax. Calcification of the aorta. Degenerative changes in the spine. IMPRESSION: Cardiac enlargement. Small bilateral pleural effusions with basilar atelectasis. Aortic atherosclerosis. Electronically Signed   By: Lucienne Capers M.D.   On: 06/16/2017 05:30   Ct Head Wo Contrast  Result Date: 06/16/2017 CLINICAL DATA:  Falls over the last 2 days. EXAM: CT HEAD WITHOUT CONTRAST CT CERVICAL SPINE WITHOUT CONTRAST TECHNIQUE: Multidetector CT imaging of the head and cervical spine was performed following the standard protocol without intravenous contrast. Multiplanar CT image reconstructions of the cervical spine were also generated. COMPARISON:  CT head 03/03/2017. CT cervical spine 12/17/2016 FINDINGS: CT HEAD FINDINGS Brain: Diffuse  cerebral atrophy. Mild ventricular dilatation consistent with central atrophy. Low-attenuation changes throughout the deep white matter consistent small vessel ischemia. No mass-effect or midline shift. No abnormal extra-axial fluid collections. Gray-white matter junctions are distinct. Basal cisterns are not effaced. No acute intracranial hemorrhage. Vascular: Intracranial arterial vascular calcifications are present. Skull: Calvarium appears intact. Sinuses/Orbits: Paranasal sinuses and mastoid air cells are clear. Other: None. CT CERVICAL SPINE FINDINGS Alignment: Normal alignment of the cervical spine. Skull base and vertebrae: Skull base appears intact. No vertebral compression deformities. No focal bone lesion or bone destruction. Old appearing ununited fracture of the T1 spinous process. Soft tissues and spinal canal: No prevertebral soft tissue swelling. No paraspinal soft tissue mass or infiltration. Disc levels: Mild degenerative changes demonstrated at C5-6 level. Degenerative changes also at C1-2. degenerative changes in the facet joints. Upper chest: Scarring suggested in the lung apices. Other: None. IMPRESSION: 1. No acute intracranial abnormalities. Chronic atrophy and small vessel ischemic changes. 2. Normal alignment of the cervical spine. Mild degenerative changes. Old appearing ununited fracture of the T1 spinous process. No acute displaced fractures identified. Electronically Signed   By: Lucienne Capers M.D.   On: 06/16/2017 06:17   Ct Cervical Spine Wo Contrast  Result Date: 06/16/2017 CLINICAL DATA:  Falls over the last 2 days. EXAM: CT HEAD WITHOUT CONTRAST CT CERVICAL SPINE WITHOUT CONTRAST TECHNIQUE: Multidetector CT imaging of the head and cervical spine was performed following the standard protocol without intravenous contrast. Multiplanar CT image reconstructions of the cervical spine were also generated. COMPARISON:  CT head 03/03/2017. CT cervical spine 12/17/2016 FINDINGS: CT  HEAD FINDINGS Brain: Diffuse cerebral atrophy. Mild ventricular dilatation consistent with central atrophy. Low-attenuation changes throughout the deep white matter consistent small vessel ischemia. No mass-effect or midline shift. No abnormal extra-axial fluid collections. Gray-white matter junctions are distinct. Basal cisterns are not effaced. No acute intracranial hemorrhage. Vascular: Intracranial arterial vascular calcifications are present. Skull: Calvarium appears intact. Sinuses/Orbits: Paranasal sinuses and mastoid air cells are clear. Other: None. CT CERVICAL SPINE FINDINGS Alignment: Normal alignment of the cervical spine. Skull base and vertebrae: Skull base appears intact. No vertebral compression deformities. No focal bone lesion or bone destruction. Old appearing ununited fracture of the T1 spinous process. Soft tissues and spinal canal: No prevertebral soft tissue swelling. No paraspinal soft tissue mass or infiltration. Disc levels: Mild degenerative changes demonstrated at C5-6 level. Degenerative changes also at C1-2. degenerative changes in the facet joints. Upper chest: Scarring suggested in the lung apices. Other: None. IMPRESSION: 1. No acute intracranial abnormalities. Chronic atrophy and small vessel ischemic changes. 2. Normal alignment of the cervical spine. Mild degenerative changes. Old appearing ununited fracture of the T1 spinous process. No acute displaced fractures identified. Electronically Signed   By: Lucienne Capers M.D.   On: 06/16/2017 06:17    EKG: Independently reviewed.  Sinus rhythm with low voltages when compared to April 06, 2017 bradycardia is now resolved.  Assessment/Plan Principal Problem:   Unstable angina (HCC) Active Problems:   COPD (chronic obstructive pulmonary disease) (HCC)   Pulmonary artery hypertension (HCC)   Hyperlipidemia   Tobacco abuse  History of seizure disorder   Coronary atherosclerosis of native coronary artery   Chronic  venous insufficiency   Falls frequently   Low oxygen saturation   DEPRESSION, MAJOR   Anxiety state   Dermatomyositis (HCC)   Radicular low back pain   Atrial fibrillation (HCC)   Essential hypertension, benign   Radiculopathy of cervical region   Schizoaffective disorder, bipolar type ([place])   L1 and L2 vertebral fracture ([place])   Extremely complicated 67 year old male with known likely coronary artery disease, COPD with hypoxemia, pulmonary artery hypertension, tobacco abuse ongoing, complicated psychiatric issues with a history of conversion disorder and schizoaffective disorder now presents with chest pain.  He has not had a recent coronary evaluation.  We will place the patient in observation and rule out myocardial infarction and consult cardiology.  1.  Unstable angina: Patient will come into the hospital under observation we will provide serial enzymes and repeat EKG.  We will continue home pain medication regimen as EKG and troponins are unremarkable.  Will consult cardiology for further evaluation management.  2.  COPD, pulmonary artery hypertension, and ongoing tobacco abuse in the face of hypoxemia: Patient has had multiple episodes where he comes into the hospital and has episodes of hypoxemia.  He had been on Lasix but this was discontinued when he had a recent admission and was dehydrated.  I believe that he would benefit from low-dose Lasix given his pulmonary hypertension and hypoxemia.  I also believe the patient should be checked for nocturnal desaturations and possibly sleep apnea as well given his body habitus.  I would recommend outpatient sleep study and nocturnal oxygen saturations obtained here in the hospital on room air.  3.  History of seizure disorder: We will check a valproic acid level.  4.  Frequent falls: Patient will need a physical therapy evaluation he has attempted to be compliant with recommendations for home physical therapy but his therapist has not been  able to make it out to his apartment.  Will consult social services as well.  5.  History of radicular back pain, cervical spine pain and L1 and L2 vertebral fractures: Patient is followed by Dr. Burney Gauze from orthopedics.  Clearly he would benefit from physical therapy as I suspect his frequent falls are due to pain and/or issues related to the spinal problems.  6.  History of schizoaffective disorder bipolar disorder and depression.  Patient not currently depressed states he is taking his medications.  We will continue to follow.  7.  Anxiety state: Patient clearly anxious in the hospital I suspect his anxiety affects his perception of symptoms.  We will continue his home Xanax.  CODE STATUS full code.  DVT prophylaxis: Lovenox Code Status: Full Family Communication: No family present Disposition Plan: Lik[facility] back [facility] Consults called: [facility] cardiology cards master phone call placed Admission status: *Observation   Lady Deutscher MD [place] [facility] Pager [phone]  If 7PM-7AM, please contact night-coverage [email or web address] Password [email or web address]  06/16/2017, 8:02 AM

## 2017-06-16 NOTE — Discharge Planning (Signed)
[facility] notes consult for [facility] services. Pt recently active with [facility] and [facility] and was released by both agencies due to pt non-compliance (not answering phone to set up visits, not allowing HH in home on day of visit...).  [facility], [facility] aware that pt has APS report and being followed by Dulce Sellar [phone].  EDCM updated [place] of pt admission and wishes to leave AMA.

## 2017-06-16 NOTE — ED Triage Notes (Signed)
Pt. From home via EMS reporting CP X12 hrs.  Pt. Reports being woke up from his sleep from chest pain. Pt. Reports  radiation to his back. Pt. States having 4 heart attacks in the past and 2 strokes Pt. Denies n/v/d.  Pt. Reports falls for the last two days. Pt. Reports hitting his head with no LOC. Pt. A/o X4 and is at baseline. Pt. Has bilateral lower edema. SPO2 91% on RA

## 2017-06-26 IMAGING — CR DG CHEST 2V
2 series · 2 of 2 positions shown · non-contrast
Comparison: 10/24/2014

CLINICAL DATA: Cough since [REDACTED] per pt. Per EMS patient has
bruising on abdomen that he does not know where it came from. The
brusing has showed up over the past 48 hours. Bruising on abdomen.

EXAM:
CHEST  2 VIEW

[w chest pa]
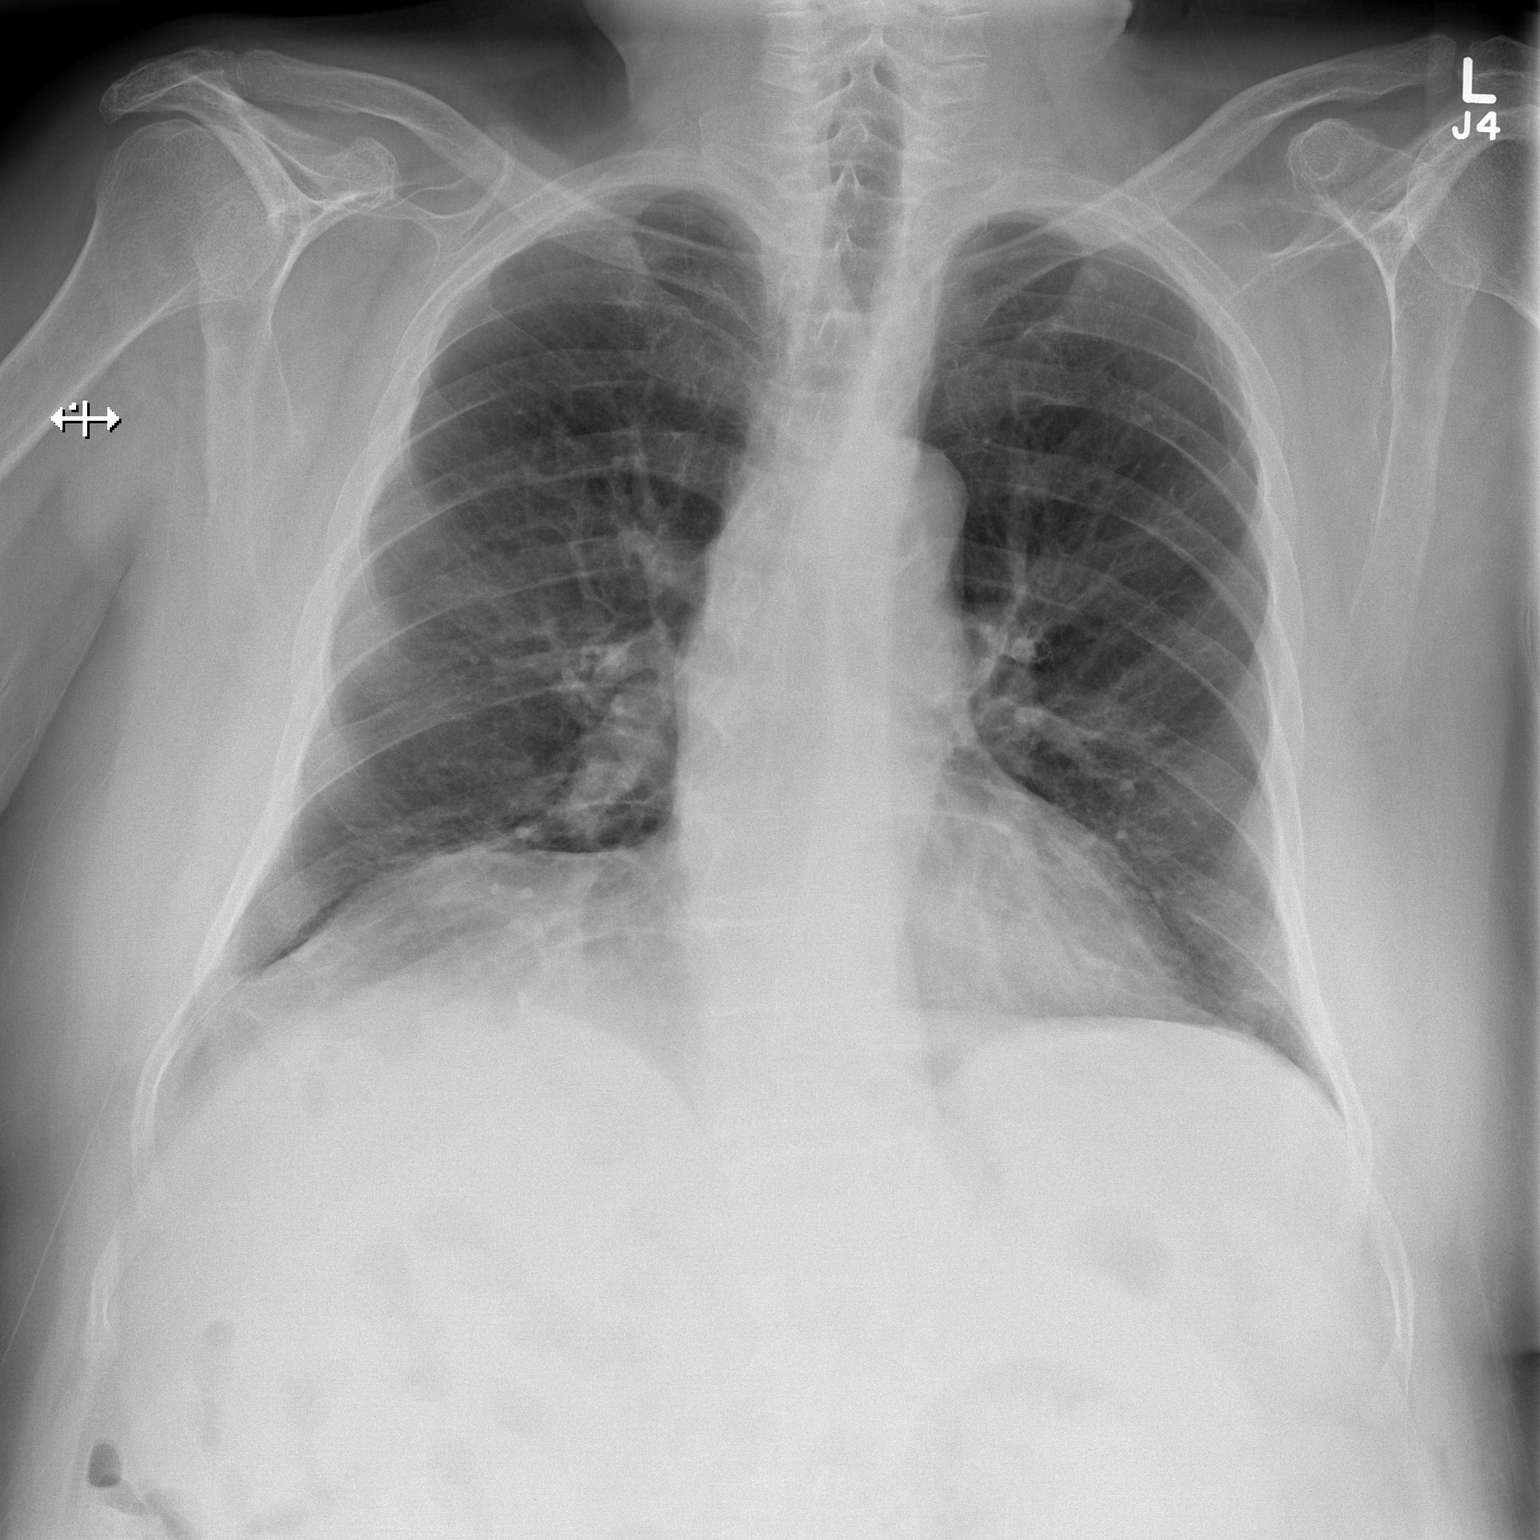

[w chest lat]
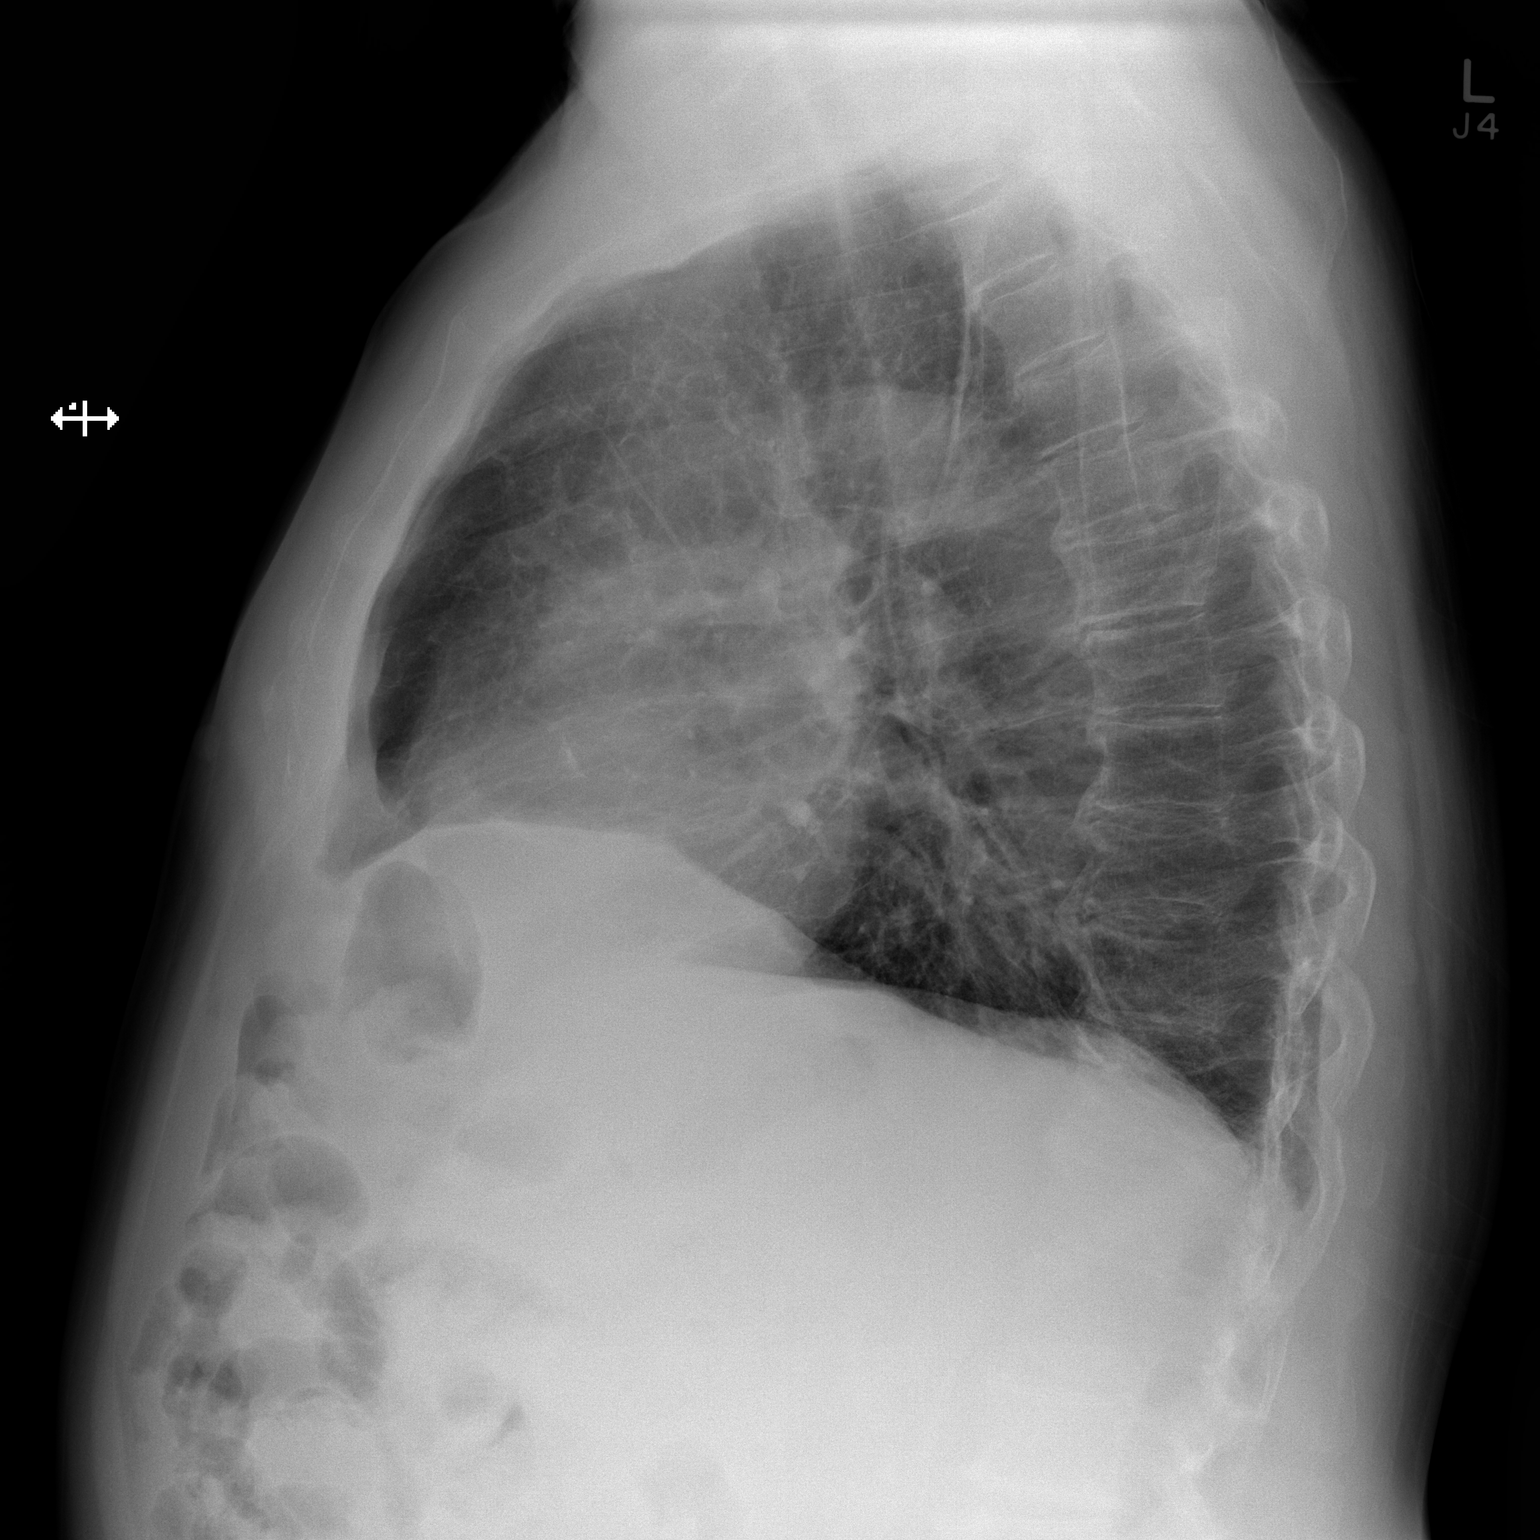

[2 of 2 positions shown; findings below may reference images not displayed]

FINDINGS: Lungs are mildly hyperinflated. There is elevation of the right
hemidiaphragm, stable since prior studies. There is mild perihilar
peribronchial thickening. There are no focal consolidations or
pleural effusions. Mid thoracic spondylosis.
IMPRESSION: 1. Hyperinflation.
2. Bronchitic changes.

## 2017-06-27 ENCOUNTER — Encounter (HOSPITAL_COMMUNITY): Payer: Self-pay | Admitting: Emergency Medicine

## 2017-06-27 ENCOUNTER — Emergency Department (HOSPITAL_COMMUNITY)
Admission: EM | Admit: 2017-06-27 | Discharge: 2017-06-28 | Disposition: A | Discharge status: Home / Self Care | Payer: Medicare Other | Attending: Emergency Medicine | Admitting: Emergency Medicine

## 2017-06-27 ENCOUNTER — Emergency Department (HOSPITAL_COMMUNITY): Payer: Medicare Other

## 2017-06-27 DIAGNOSIS — E119 Type 2 diabetes mellitus without complications: Secondary | ICD-10-CM | POA: Diagnosis not present

## 2017-06-27 DIAGNOSIS — J449 Chronic obstructive pulmonary disease, unspecified: Secondary | ICD-10-CM | POA: Insufficient documentation

## 2017-06-27 DIAGNOSIS — I10 Essential (primary) hypertension: Secondary | ICD-10-CM | POA: Insufficient documentation

## 2017-06-27 DIAGNOSIS — Z7982 Long term (current) use of aspirin: Secondary | ICD-10-CM | POA: Insufficient documentation

## 2017-06-27 DIAGNOSIS — M25552 Pain in left hip: Secondary | ICD-10-CM | POA: Diagnosis not present

## 2017-06-27 DIAGNOSIS — F17210 Nicotine dependence, cigarettes, uncomplicated: Secondary | ICD-10-CM | POA: Insufficient documentation

## 2017-06-27 DIAGNOSIS — Z79899 Other long term (current) drug therapy: Secondary | ICD-10-CM | POA: Diagnosis not present

## 2017-06-27 DIAGNOSIS — S7002XA Contusion of left hip, initial encounter: Secondary | ICD-10-CM

## 2017-06-27 NOTE — ED Provider Notes (Signed)
MSE was initiated and I personally evaluated the patient and placed orders (if any) at  4:13 PM on Jun 27, 2017.  The patient appears stable so that the remainder of the MSE may be completed by another provider.   Patient placed in Quick Look pathway, seen and evaluated   Chief Complaint: Hip pain  HPI:   Patient is here for evaluation of bruising over the left hip.  He has a history of difficulty ambulating, using a cane and multiple falls.  He has a medical history of muscular dystrophy.  Patient states that he broke some ribs about a month ago and was recommended to go to an nursing facility for rehab but chose not to.  Patient fell a few days ago onto his left side.  He also skinned his left elbow.  He noticed bruising over the left hip and has been having to use 2 canes to ambulate due to pain.  ROS: Hip pain (one)  Physical Exam:   Gen: No distress  Neuro: Awake and Alert  Skin: Warm    Focused Exam: Hip pain, tenderness over the lateral trochanter and bruising. Abrasion of the left elbow   Initiation of care has begun. The patient has been counseled on the process, plan, and necessity for staying for the completion/evaluation, and the remainder of the medical screening examination    Margarita Mail, PA-C 06/27/17 1617    Davonna Belling, MD 06/27/17 [phone]

## 2017-06-27 NOTE — ED Triage Notes (Signed)
Pt arrives via EMS from assisted living facility [facility]. Pt fell this past Saturday and injured his hip. Pt has had an abnormal gait and pain in left hip ever since. Pt has bruising per EMS. Unwitnessed fall. Pt has hx of dementia.

## 2017-06-27 NOTE — ED Notes (Signed)
Pt remains sleeping in [facility]

## 2017-06-28 NOTE — ED Notes (Signed)
Pt verbalizes understanding of d/c instructions. Pt taken in stretcher by [facility] at d/c with all belongings.

## 2017-06-28 NOTE — ED Notes (Signed)
ED Provider at bedside. 

## 2017-06-28 NOTE — ED Provider Notes (Signed)
[facility]PART[facility] Provider Note   CSN: [id number] Arrival date & time: 06/27/17  1558     History   Chief Complaint Chief Complaint  Patient presents with  . Fall    HPI Patrick Holt is a 67 y.o. male.  Patient is a 67 year old male with past medical history of bipolar disorder, A. fib, COPD, and diabetes.  He presents today for evaluation of left hip pain.  He reports falling 2 days ago and landing on his left hip.  He has pain and bruising in this area since.  He denies any other injury.  He reports to me he is unsteady at baseline and these falls are not uncommon for him.  The history is provided by the patient.  Fall  This is a new problem. The current episode started 2 days ago. The problem occurs constantly. The problem has not changed since onset.The symptoms are aggravated by walking. The symptoms are relieved by rest. He has tried nothing for the symptoms.    Past Medical History:  Diagnosis Date  . Anemia   . Anxiety   . Asthma   . Atrial fibrillation ([place]) 01/2012   with RVR  . Bipolar 1 disorder ([place])   . Cancer ([place])   . Collagen vascular disease ([place])   . Conversion disorder 06/2010  . COPD (chronic obstructive pulmonary disease) ([place])   . Dermatomyositis ([place])   . Dermatomyositis ([place])   . Diverticulitis   . Diverticulosis of colon   . Dysrhythmia    "irregular" (11/15/2012)  . Esophageal dysmotility   . Esophageal stricture   . Fibromyalgia   . Gastritis   . GERD (gastroesophageal reflux disease)   . Hand fracture, right    sept 13, 2017, still wearing splint as og 01-10-2017  . Headache(784.0)    "severe; get them often" (11/15/2012)  . Hiatal hernia   . History of narcotic addiction ([place])   . Hx of adenomatous colonic polyps   . Hyperlipidemia   . Hypertension   . Internal hemorrhoids   . Ischemic heart disease   . Major depression    with acute psychotic break in 06/2010  . Myocardial infarct ([place])    mulitple (1999, 2000, 2004)  . Narcotic dependence ([place])   . Nephrolithiasis   . Obesity   . OCD (obsessive compulsive disorder)   . Otosclerosis   . Paroxysmal A-fib ([place])   . Peripheral neuropathy   . Raynaud's disease   . Renal insufficiency   . Rheumatoid arthritis(714.0)   . Sarcoidosis   . Seizures ([place])   . Subarachnoid hemorrhage ([place]) 01/2012   with subdural  hematoma.   . Type II diabetes mellitus ([place])   . Urge incontinence   . Vertigo     Patient Active Problem List   Diagnosis Date Noted  . Unstable angina ([place]) 06/16/2017  . L1 and L2 vertebral fracture ([place]) 04/06/2017  . Multiple rib fractures 04/06/2017  . Evaluation by psychiatric service required 03/07/2017  . Acute metabolic encephalopathy 44/81/8563  . Hypoglycemia 03/04/2017  . Acute respiratory failure with hypoxia ([place]) 2020/11/2117  . Acute encephalopathy 2020/11/2117  . Rhabdomyolysis 02/05/2017  . Acute respiratory failure with hypoxia and hypercapnia ([place]) 02/05/2017  . COPD with acute exacerbation ([place]) 02/05/2017  . Bipolar 1 disorder ([place]) 02/05/2017  . Anemia 02/05/2017  . Hypertension 02/05/2017  . Hand fracture, right 02/05/2017  . Narcotic dependence ([place]) 02/05/2017  . Pre-diabetes 02/05/2017  . PAF (paroxysmal atrial  fibrillation) ([place]) 02/05/2017  . Frequent falls 02/05/2017  . History of dermatomyositis 02/05/2017  . Seizure disorder ([place]) 02/05/2017  . Bilateral lower extremity edema 02/05/2017  . Pulmonary artery hypertension ([place]) 01/15/2017  . Altered mental status 01/13/2017  . Low oxygen saturation   . Pain in right wrist 02/27/2016  . Altered mental state 11/14/2015  . Severe dehydration 11/14/2015  . Hypernatremia 11/14/2015  . Difficulty in walking, not elsewhere classified   . Right wrist fracture   . Closed displaced fracture of ulnar styloid with routine healing   . Scapholunate ligament injury with no instability   . Falls frequently   . Hypoxia 11/02/2015  .  Anxiety 06/06/2015  . Impaired mobility 04/28/2015  . Lumbar spondylosis with myelopathy 03/07/2015  . Nocturia 12/03/2014  . Chronic venous insufficiency 09/23/2014  . Poor social situation 07/30/2014  . Schizophrenia, acute ([place])   . Schizoaffective disorder, bipolar type ([place])   . Decreased visual acuity 06/27/2014  . COPD exacerbation ([place])   . Right sided weakness 01/04/2014  . Essential hypertension, benign   . Atrial fibrillation, unspecified   . Radiculopathy of cervical region   . Polyuria 12/07/2013  . Erectile dysfunction 12/07/2013  . Diverticulitis 08/01/2013  . Acute diverticulitis 08/01/2013  . Hematochezia 06/04/2013  . Hypokalemia 10/03/2012  . Benign neoplasm of colon 09/27/2012  . Stricture and stenosis of esophagus 09/27/2012  . Diverticulitis of colon without hemorrhage 07/26/2012  . Polypharmacy 03/26/2012  . Seizures ([place]) 02/10/2012  . Atrial fibrillation ([place]) 02/05/2012  . Coronary atherosclerosis of native coronary artery 02/05/2012  . Radicular low back pain 03/09/2011  . History of seizure disorder 01/29/2011  . Irritable bowel syndrome (IBS) 06/24/2010  . Urge incontinence 06/19/2010  . Tobacco abuse 11/23/2008  . DEPRESSION, MAJOR 05/31/2008  . ISCHEMIC HEART DISEASE 05/31/2008  . RAYNAUD'S DISEASE 05/31/2008  . HIATAL HERNIA 05/31/2008  . Rheumatoid arthritis ([place]) 05/31/2008  . FIBROMYALGIA 05/31/2008  . Hyperlipidemia 01/26/2008  . Anxiety state 01/26/2008  . HEMORRHOIDS, INTERNAL 01/26/2008  . COPD (chronic obstructive pulmonary disease) ([place]) 01/26/2008  . ESOPHAGEAL MOTILITY DISORDER 01/26/2008  . GERD 01/26/2008  . Dermatomyositis ([place]) 01/26/2008    Past Surgical History:  Procedure Laterality Date  . BACK SURGERY    . CARDIAC CATHETERIZATION    . CARPAL TUNNEL RELEASE Bilateral   . CATARACT EXTRACTION W/ INTRAOCULAR LENS IMPLANT Left   . COLONOSCOPY N/A 09/27/2012   Procedure: COLONOSCOPY;  Surgeon: Lafayette Dragon, MD;   Location: [facility] ENDOSCOPY;  Service: Endoscopy;  Laterality: N/A;  . ESOPHAGOGASTRODUODENOSCOPY N/A 09/27/2012   Procedure: ESOPHAGOGASTRODUODENOSCOPY (EGD);  Surgeon: Lafayette Dragon, MD;  Location: [facility];  Service: Endoscopy;  Laterality: N/A;  . KNEE ARTHROSCOPY W/ MENISCAL REPAIR Left 2009  . LUMBAR DISC SURGERY    . LYMPH NODE DISSECTION Right 1970's   "neck; dr thought I had Hodgkins; turned out to be sarcoidosis" (11/15/2012)  . squamous papilloma   2010   removed by Dr. Constance Holster ENT, noted on tongue  . TONSILLECTOMY          Home Medications    Prior to Admission medications   Medication Sig Start Date End Date Taking? Authorizing Provider  ALPRAZolam Duanne Moron) 1 MG tablet Take 1 mg by mouth 3 (three) times daily. 01/10/17   [provider]  amiodarone (PACERONE) 200 MG tablet Take 1 tablet (200 mg total) by mouth daily. 02/10/17   Alma Friendly, MD  amLODipine (NORVASC) 5 MG tablet Take 1 tablet (5  mg total) by mouth daily. 01/19/17   Velvet Bathe, MD  aspirin EC 81 MG tablet Take 1 tablet (81 mg total) by mouth daily. 10/17/15   [name], MD  atorvastatin (LIPITOR) 40 MG tablet Take 1 tablet (40 mg total) by mouth daily. 01/19/17   Velvet Bathe, MD  budesonide-formoterol [facility]) 160-4.5 MCG/ACT inhaler Inhale 2 puffs into the lungs 2 (two) times daily. 01/19/17   Velvet Bathe, MD  divalproex (DEPAKOTE) 250 MG DR tablet Take 250 mg by mouth 3 (three) times daily.  03/16/17   [provider]  ergocalciferol (VITAMIN D2) 50000 units capsule Take 50,000 Units by mouth once a week.    [provider]  esomeprazole (NEXIUM) 40 MG capsule Take 40 mg by mouth daily. 07/26/16   [provider]  furosemide (LASIX) 20 MG tablet Take 20 mg by mouth daily.    [provider]  ibuprofen (ADVIL,MOTRIN) 200 MG tablet Take 200 mg by mouth every 6 (six) hours as needed for headache or moderate pain.    [provider]    ipratropium-albuterol (DUONEB) 0.5-2.5 (3) MG/3ML SOLN Inhale 3 mLs into the lungs every 6 (six) hours as needed. Patient taking differently: Inhale 3 mLs into the lungs every 6 (six) hours as needed (for breathing).  11/17/15   Elgergawy, Silver Huguenin, MD  levETIRAcetam (KEPPRA) 500 MG tablet Take 500 mg by mouth 2 (two) times daily.    [provider]  loperamide (IMODIUM) 2 MG capsule Take 2 mg by mouth as needed for diarrhea or loose stools.    [provider]  Multiple Vitamin (MULTIVITAMIN WITH MINERALS) TABS tablet Take 1 tablet by mouth daily. 10/17/15   [name], MD  oxybutynin (DITROPAN) 5 MG tablet Take 5 mg by mouth 3 (three) times daily.  07/26/16   [provider]  potassium chloride 20 MEQ TBCR Take 20 mEq by mouth daily. 02/23/17   Domenic Polite, MD  risperiDONE (RISPERDAL) 1 MG tablet Take 1 mg by mouth 2 (two) times daily. 03/25/17   [provider]  traMADol (ULTRAM) 50 MG tablet Take 50 mg by mouth every 6 (six) hours as needed for moderate pain.    [provider]    Family History Family History  Problem Relation Age of Onset  . Alcohol abuse Mother   . Depression Mother   . Heart disease Mother   . Diabetes Mother   . Stroke Mother   . Coronary artery disease Father   . Diabetes Other        1/2 brother  . Hepatitis Brother     Social History Social History   Tobacco Use  . Smoking status: Light Tobacco Smoker    Packs/day: 0.50    Years: 30.00    Pack years: 15.00    Types: Cigarettes    Last attempt to quit: 12/15/2014    Years since quitting: 2.5  . Smokeless tobacco: Never Used  . Tobacco comment: 1/2 cig BID  Substance Use Topics  . Alcohol use: No    Alcohol/week: 0.0 oz    Comment: Alcohol stopped in September of 2014  . Drug use: No     Allergies   Immune globulins; Ciprofloxacin; Flagyl [metronidazole]; Lisinopril; Sulfa antibiotics; Cefepime; Morphine and related; Requip [ropinirole hcl];  Vancomycin; Betamethasone dipropionate; Bupropion hcl; Clobetasol; Codeine; Escitalopram oxalate; Fluoxetine hcl; Meclizine; Paroxetine; Penicillins; Tacrolimus; Tetanus toxoid; and Tuberculin purified protein derivative   Review of Systems Review of Systems  All  other systems reviewed and are negative.    Physical Exam Updated Vital Signs BP 118/77   Pulse 60   Temp 98.3 F (36.8 C) (Oral)   Resp 16   SpO2 97%   Physical Exam  Constitutional: He is oriented to person, place, and time. He appears well-developed and well-nourished. No distress.  HENT:  Head: Normocephalic and atraumatic.  Mouth/Throat: Oropharynx is clear and moist.  Neck: Normal range of motion. Neck supple.  Cardiovascular: Normal rate and regular rhythm. Exam reveals no friction rub.  No murmur heard. Pulmonary/Chest: Effort normal and breath sounds normal. No respiratory distress. He has no wheezes. He has no rales.  Abdominal: Soft. Bowel sounds are normal. He exhibits no distension. There is no tenderness.  Musculoskeletal: Normal range of motion. He exhibits no edema.  There is a small hematoma noted to the lateral aspect of the left hip.  He has good range of motion and there is no other deformity.  Distal PMS is intact.  Neurological: He is alert and oriented to person, place, and time. Coordination normal.  Skin: Skin is warm and dry. He is not diaphoretic.  Nursing note and vitals reviewed.    ED Treatments / Results  Labs (all labs ordered are listed, but only abnormal results are displayed) Labs Reviewed - No data to display  EKG None  Radiology Dg Hip Unilat With Pelvis 2-3 Views Left  Result Date: 06/27/2017 CLINICAL DATA:  [place] 4 days ago with left hip pain. EXAM: DG HIP (WITH OR WITHOUT PELVIS) 2-3V LEFT COMPARISON:  None. FINDINGS: There is no evidence of hip fracture or dislocation. There is no evidence of arthropathy or other focal bone abnormality. IMPRESSION: Negative.  Electronically Signed   By: Nelson Chimes M.D.   On: 06/27/2017 16:49    Procedures Procedures (including critical care time)  Medications Ordered in ED Medications - No data to display   Initial Impression / Assessment and Plan / ED Course  I have reviewed the triage vital signs and the nursing notes.  Pertinent labs & imaging results that were available during my care of the patient were reviewed by me and considered in my medical decision making (see chart for details).  X-rays are negative.  He does not examine like a broken hip and has good range of motion.  At this point, I feel as though he is appropriate for discharge.  He is to follow-up as needed.  Final Clinical Impressions(s) / ED Diagnoses   Final diagnoses:  None    ED Discharge Orders    None       Veryl Speak, MD 06/28/17 [phone]

## 2017-06-28 NOTE — Discharge Instructions (Addendum)
Follow-up with your primary doctor if symptoms are not improving in the next week, and return to the ER if symptoms significantly worsen or change.

## 2017-06-28 NOTE — ED Notes (Signed)
PTAR called for transport.  

## 2017-07-24 IMAGING — CR DG LUMBAR SPINE 2-3V
3 series · 3 of 3 positions shown · non-contrast
Comparison: MRI on 07/26/2014

CLINICAL DATA: Initial encounter for Pt fell in bathroom today pain
all over

EXAM:
LUMBAR SPINE - 2-3 VIEW

[l-spine ap]
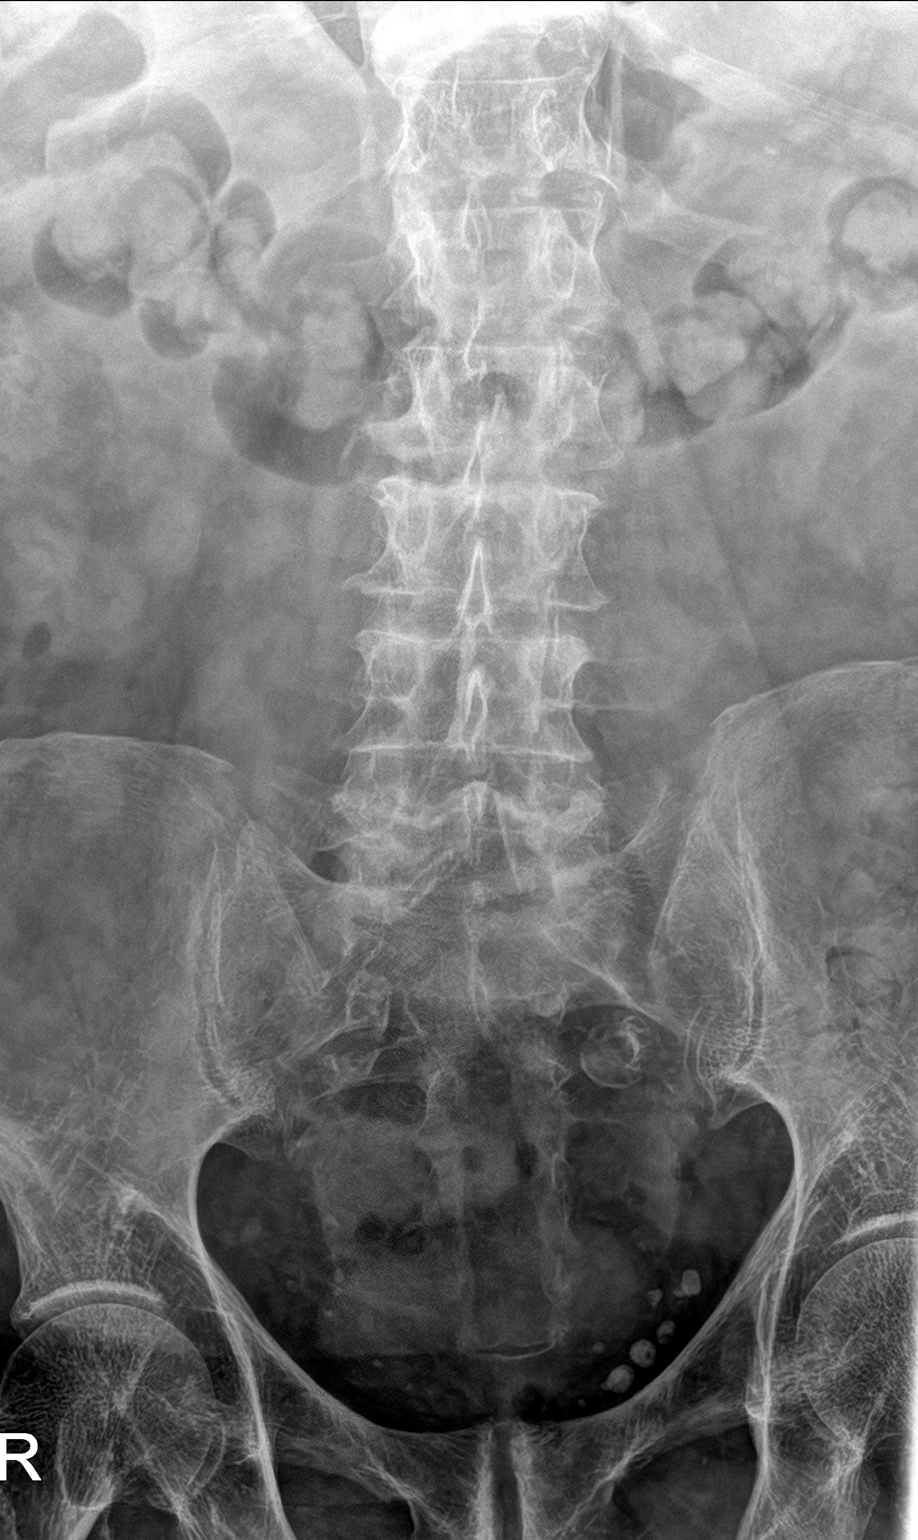

[l-spine lat]
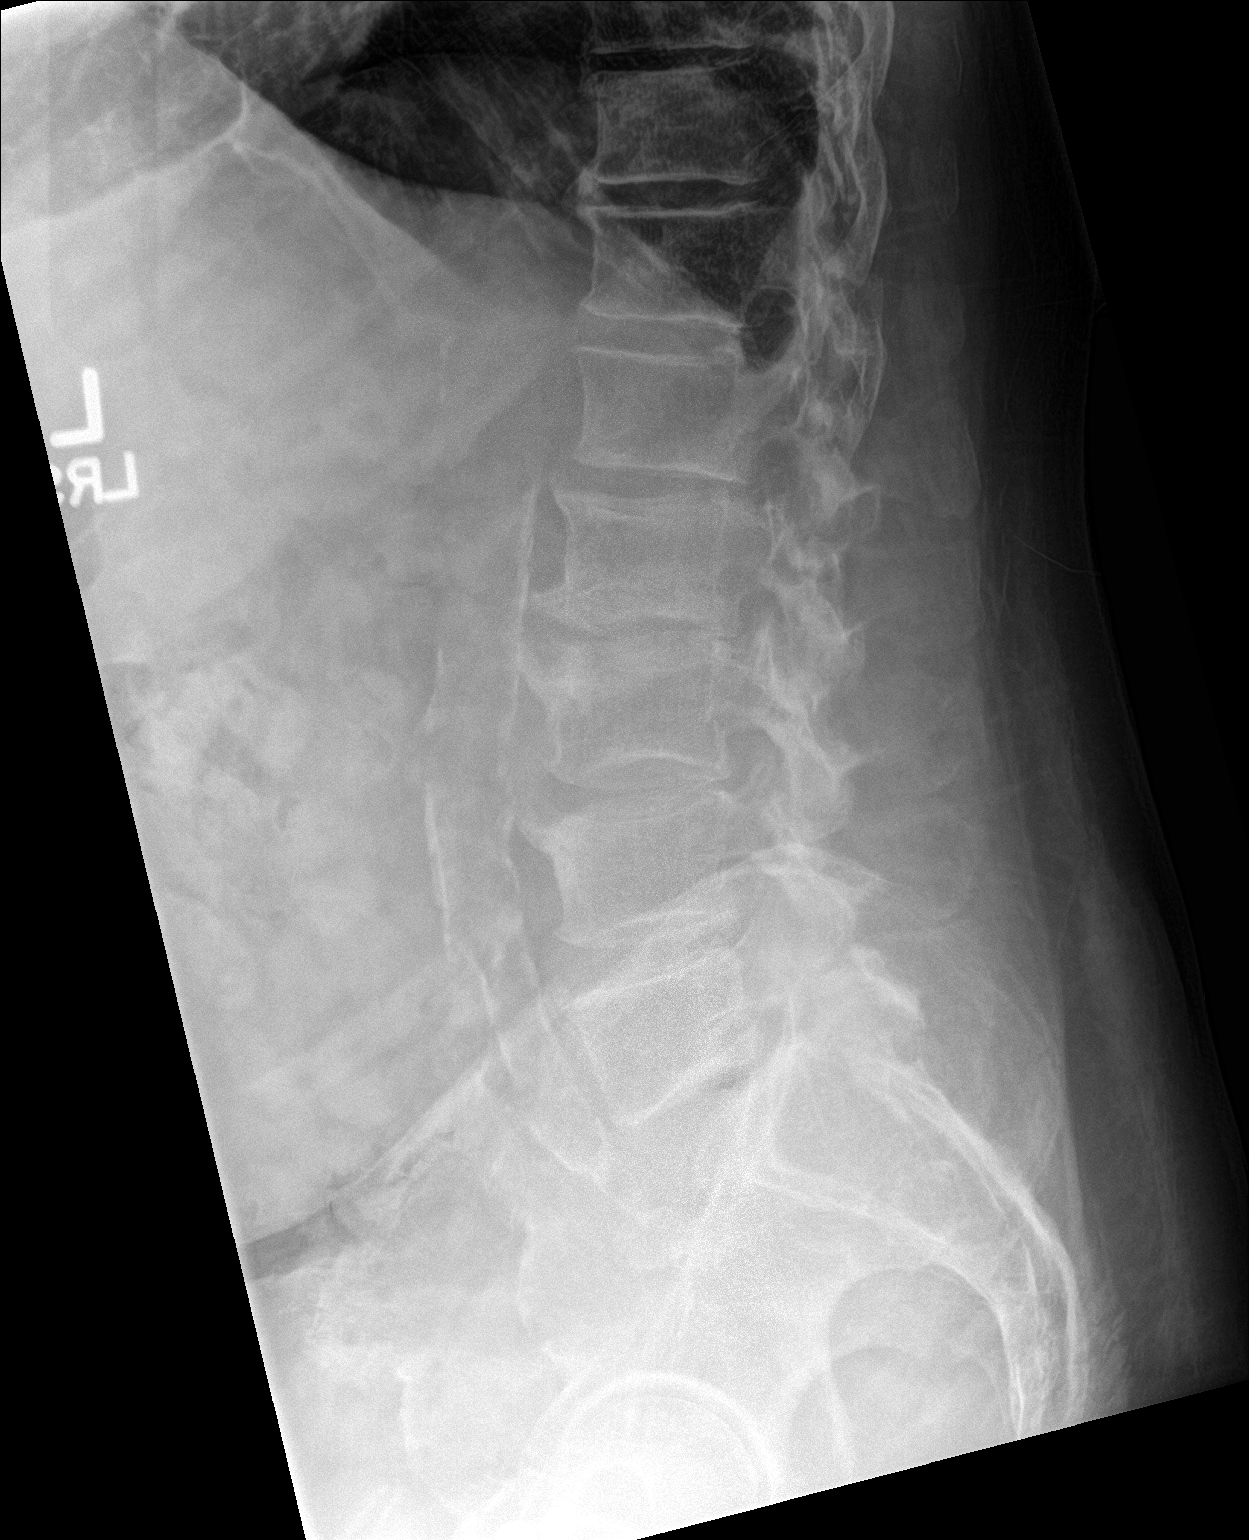

[l-spine spot]
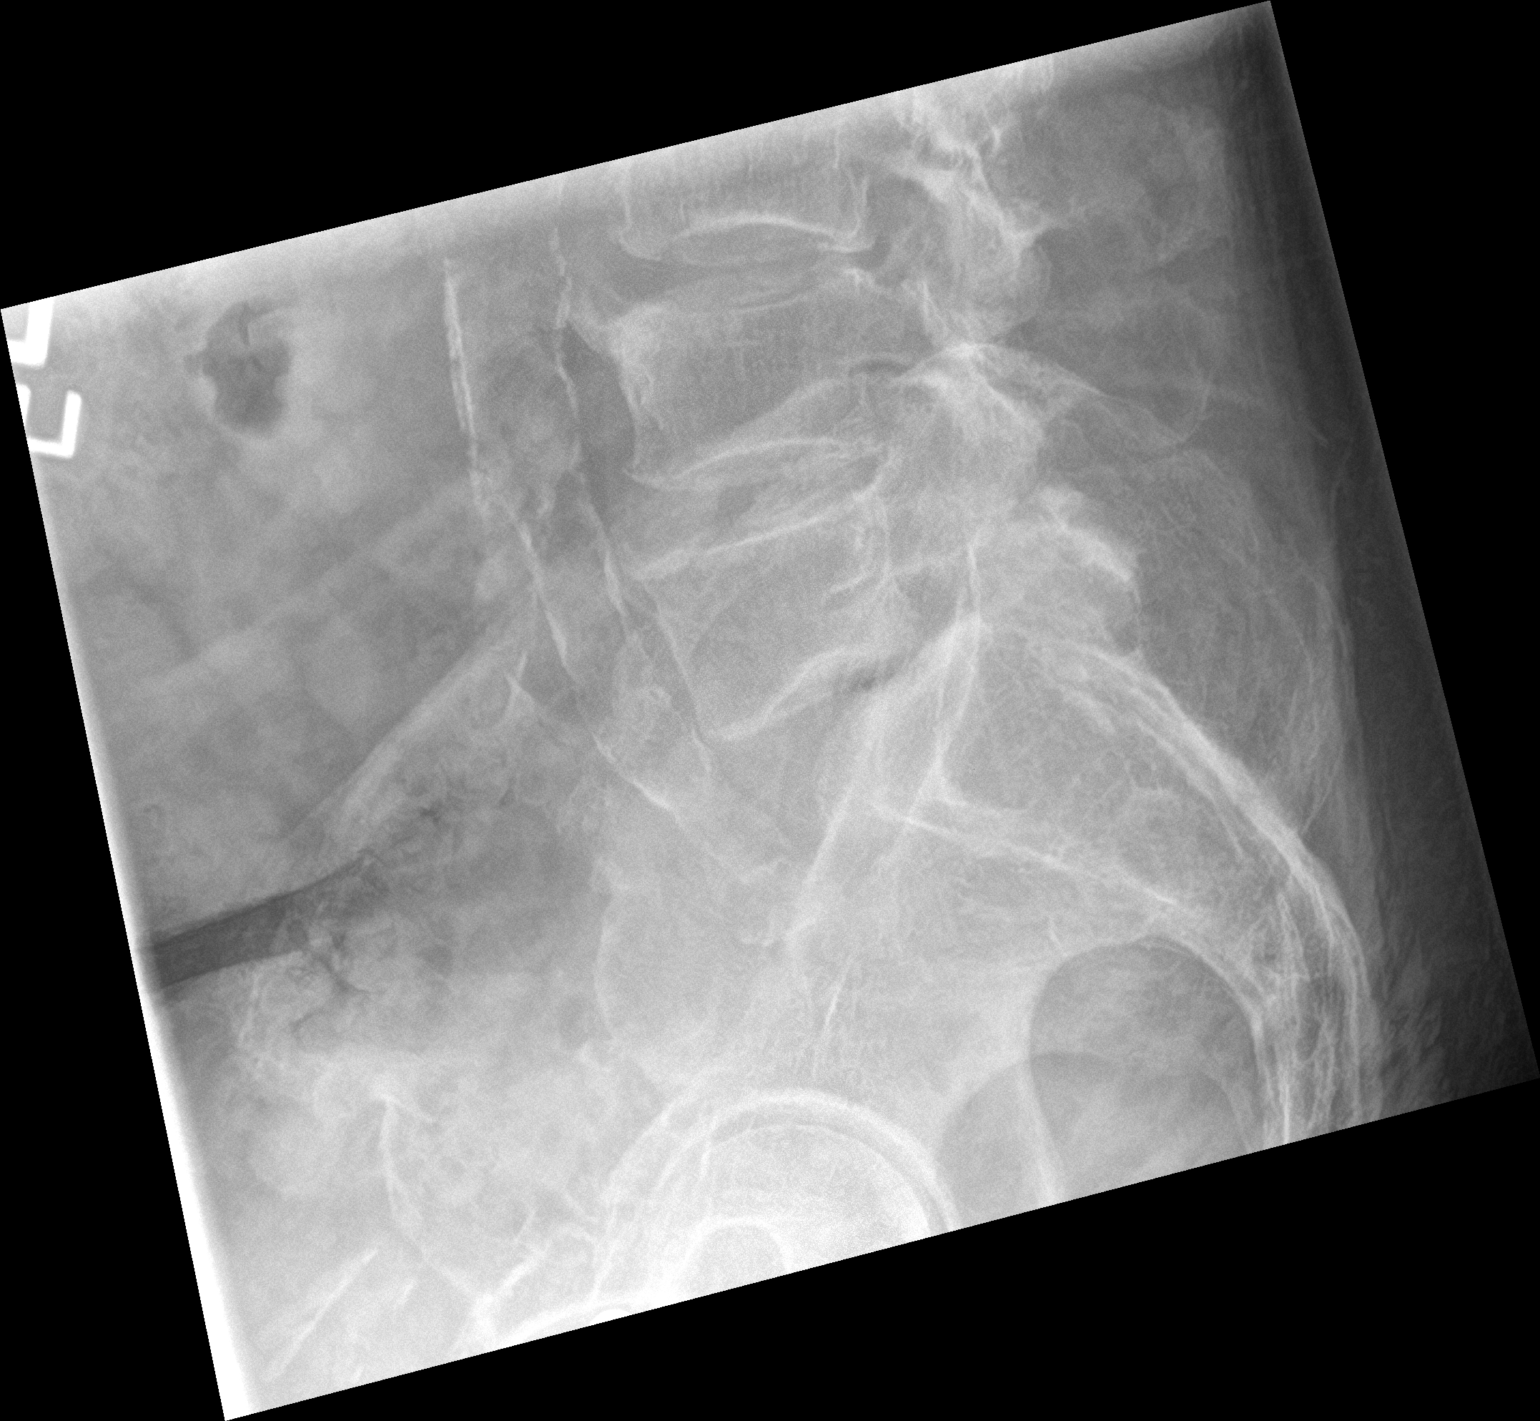

[3 of 3 positions shown; findings below may reference images not displayed]

FINDINGS: Dense aortic atherosclerosis. Five lumbar type vertebral bodies.
Maintenance of vertebral body height. Grade 1 L5-S1 anterolisthesis
is similar. Spondylosis, including degenerative disc disease at the
lumbosacral junction and L2-3 level. Facet arthropathy at multiple
lumbar levels.
IMPRESSION: Advanced spondylosis, without acute vertebral body height loss.

Similar grade 1 L5-S1 anterolisthesis.

Advanced aortic atherosclerosis.

## 2017-07-24 IMAGING — CT CT CERVICAL SPINE W/O CM
4 series · 18 of 33 positions shown, 21 images · non-contrast
Comparison: 06/28/2014

CLINICAL DATA: Patient fell in the shower on [REDACTED]. Neck pain,
back pain, and right elbow abrasion P 8 bilateral knee pain.
Posterior neck pain and swelling.

EXAM:
CT CERVICAL SPINE WITHOUT CONTRAST
TECHNIQUE: Multidetector CT imaging of the cervical spine was performed without
intravenous contrast. Multiplanar CT image reconstructions were also
generated.

[Series 202: soft tissue, idose (2) · axial · 0.32mm/px · z∈[+88,+206]mm · 5 of 89 slices shown]
[im 15/89  soft-tissue]
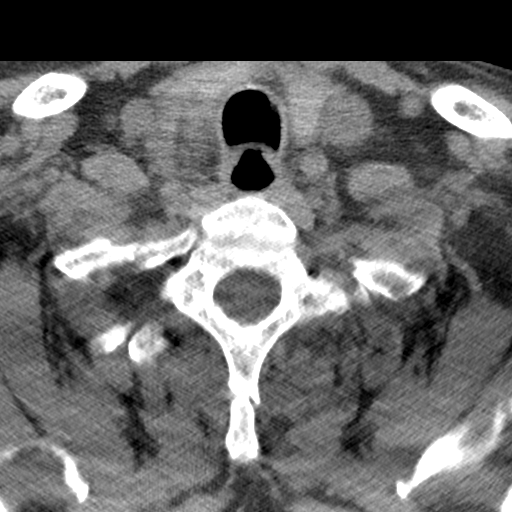
[im 30/89  soft-tissue]
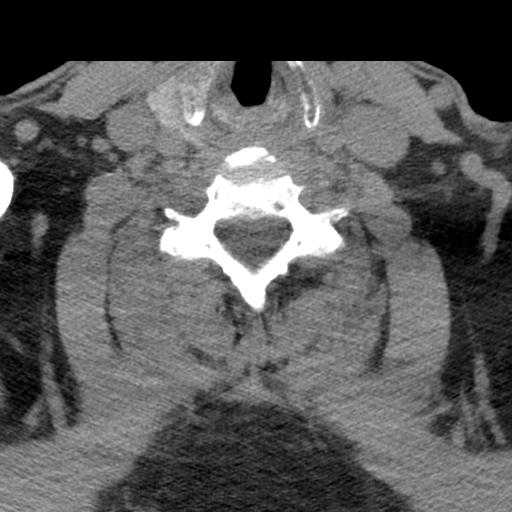
[im 45/89  soft-tissue]
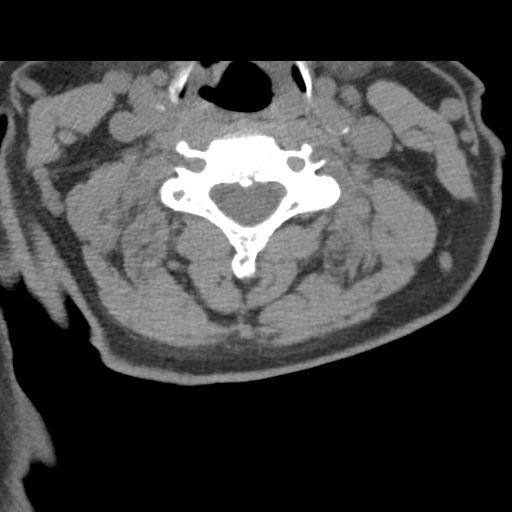
[im 59/89  soft-tissue]
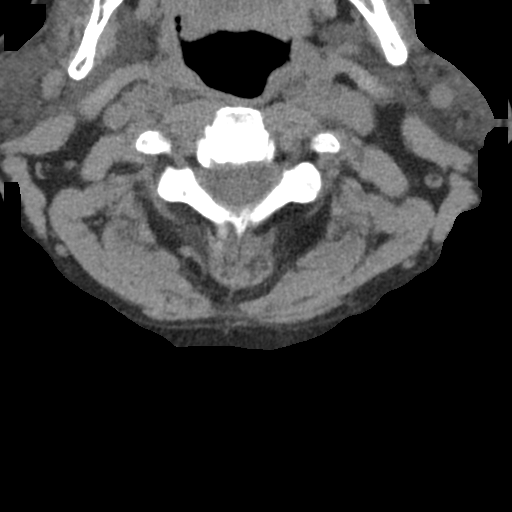
[im 74/89  soft-tissue]
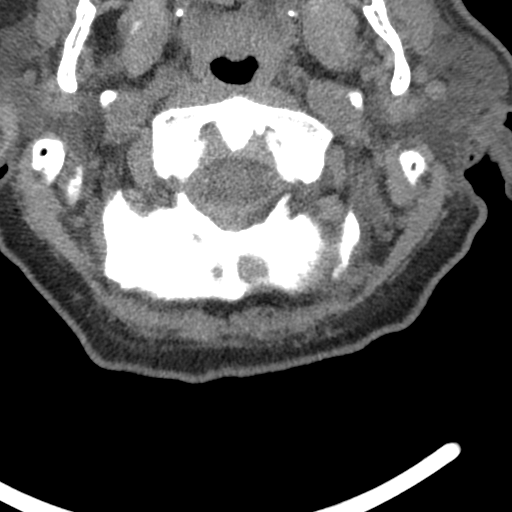

[Series 204: coronal, idose (2) · coronal · 0.34mm/px · 3 of 62 slices shown]
[im 13/62  bone]
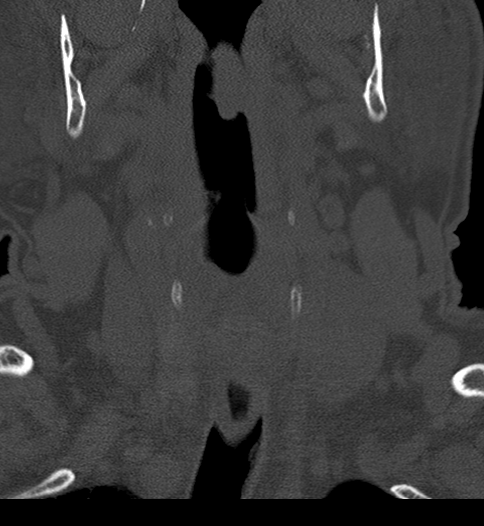
[im 25/62  bone]
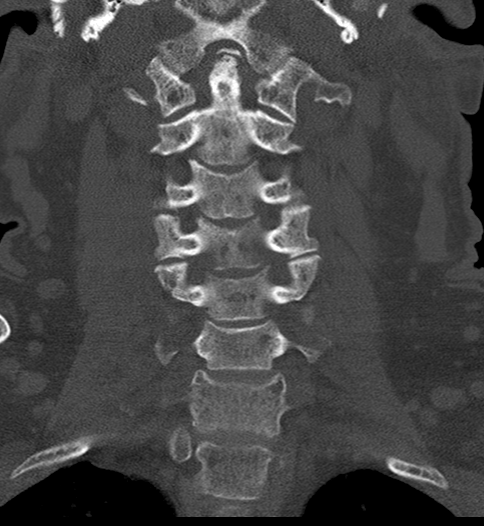
[im 37/62  bone]
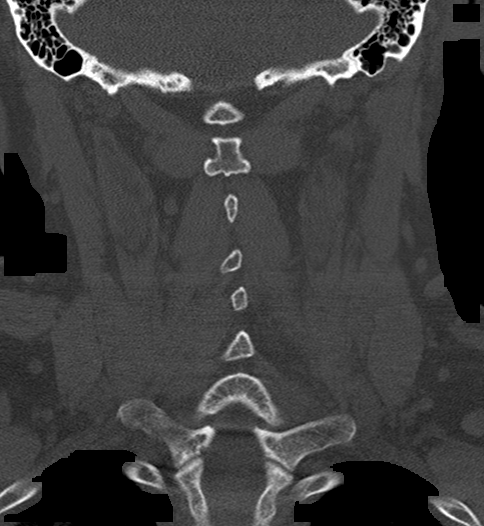

[Series 205: sagittal, idose (2) · sagittal · 0.32mm/px · 5 of 68 slices shown, 6 images]
[im 23/68  bone]
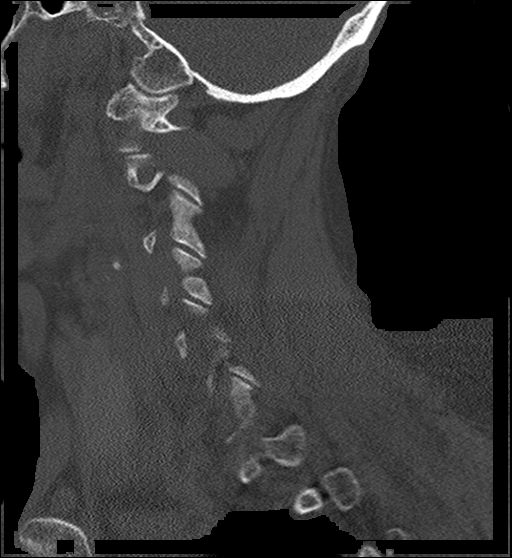
[im 28/68  bone]
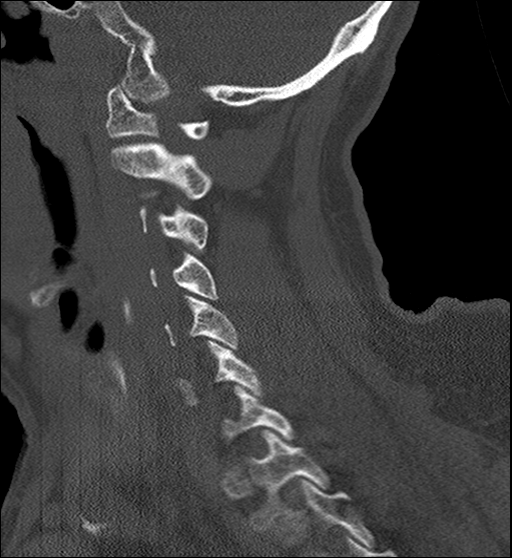
[im 34/68  soft-tissue]
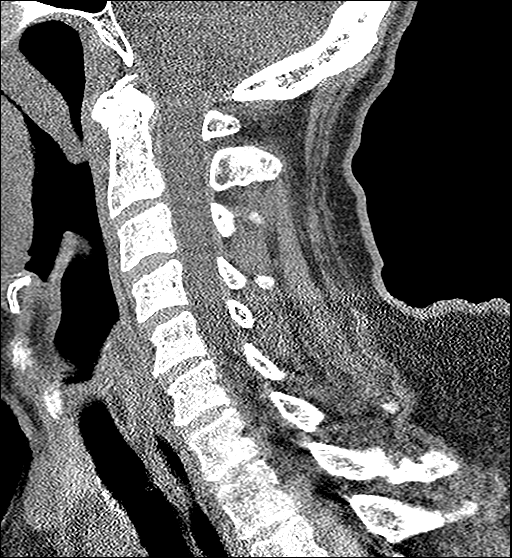
[im 34/68  bone]
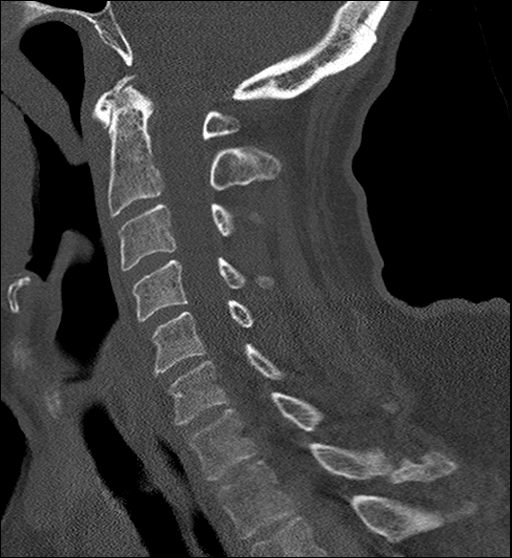
[im 40/68  bone]
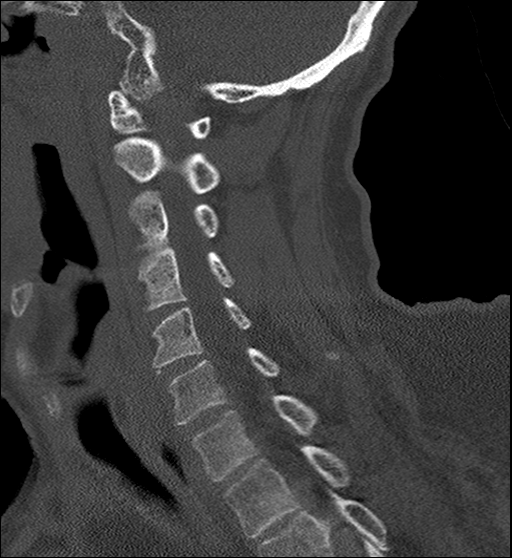
[im 45/68  bone]
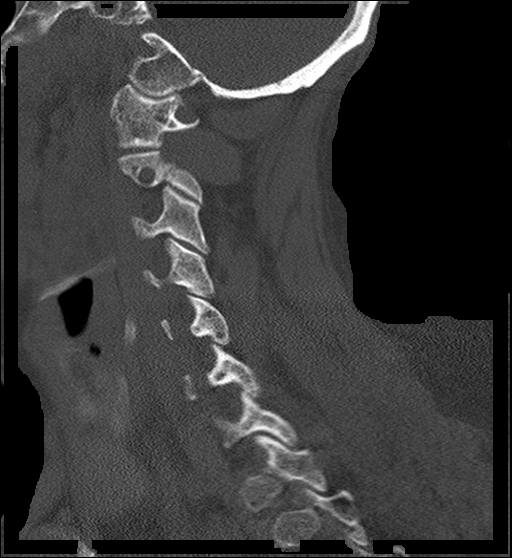

[Series 206: orthogonals, idose (2) · axial · 0.35mm/px · z∈[+78,+183]mm · 5 of 82 slices shown, 7 images]
[im 14/82  soft-tissue]
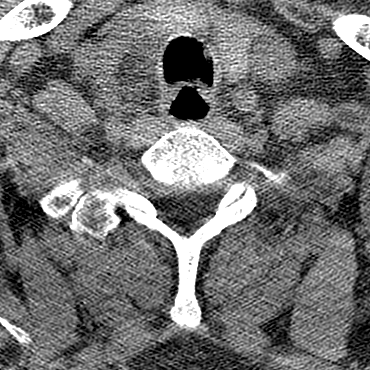
[im 14/82  bone]
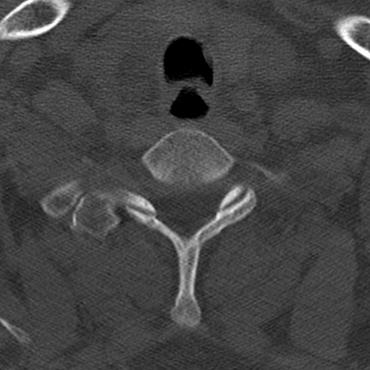
[im 28/82  bone]
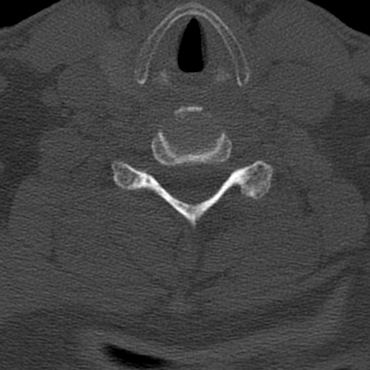
[im 41/82  bone]
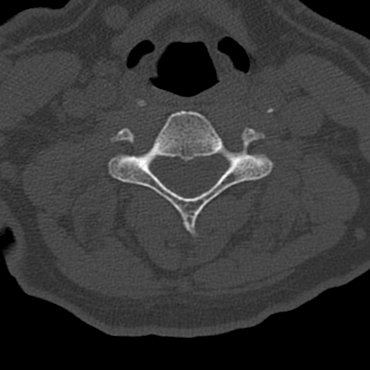
[im 55/82  bone]
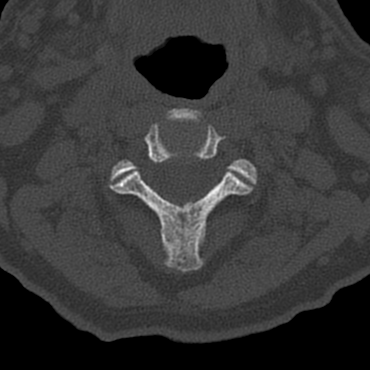
[im 68/82  soft-tissue]
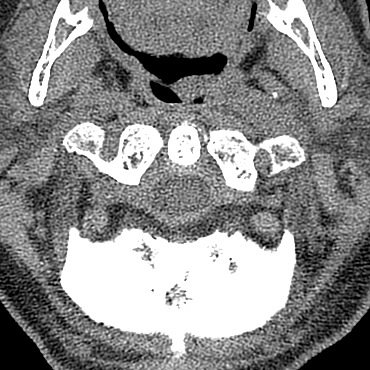
[im 68/82  bone]
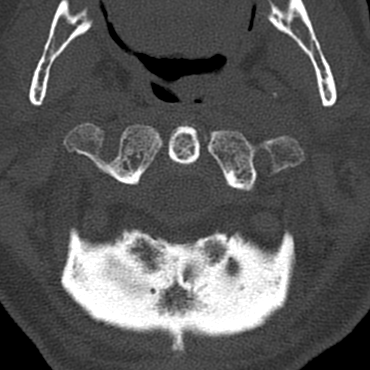

[18 of 33 positions shown; findings below may reference images not displayed]

FINDINGS: Normal alignment of the cervical spine. Mild degenerative endplate
hypertrophic changes at C5-6 and C6-7. No vertebral compression
deformities. No prevertebral soft tissue swelling. C1-2 articulation
appears intact. No focal bone lesion or bone destruction. Bone
cortex and trabecular architecture appear intact. Old fracture of
the spinous process of T1. Vascular calcifications. Mild focal
infiltration in the right lung apex.
IMPRESSION: Normal alignment of the cervical spine. Mild degenerative changes.
Old fracture of the spinous process of T1. No acute displaced
fractures identified.

## 2017-08-14 ENCOUNTER — Emergency Department (HOSPITAL_COMMUNITY)
Admission: EM | Admit: 2017-08-14 | Discharge: 2017-08-14 | Disposition: A | Discharge status: Home / Self Care | Payer: Medicare Other | Attending: Emergency Medicine | Admitting: Emergency Medicine

## 2017-08-14 ENCOUNTER — Encounter (HOSPITAL_COMMUNITY): Payer: Self-pay

## 2017-08-14 ENCOUNTER — Other Ambulatory Visit: Payer: Self-pay

## 2017-08-14 DIAGNOSIS — J45909 Unspecified asthma, uncomplicated: Secondary | ICD-10-CM | POA: Insufficient documentation

## 2017-08-14 DIAGNOSIS — M79671 Pain in right foot: Secondary | ICD-10-CM | POA: Diagnosis present

## 2017-08-14 DIAGNOSIS — I10 Essential (primary) hypertension: Secondary | ICD-10-CM | POA: Insufficient documentation

## 2017-08-14 DIAGNOSIS — I2510 Atherosclerotic heart disease of native coronary artery without angina pectoris: Secondary | ICD-10-CM | POA: Insufficient documentation

## 2017-08-14 DIAGNOSIS — Z79899 Other long term (current) drug therapy: Secondary | ICD-10-CM | POA: Diagnosis not present

## 2017-08-14 DIAGNOSIS — F17200 Nicotine dependence, unspecified, uncomplicated: Secondary | ICD-10-CM | POA: Insufficient documentation

## 2017-08-14 DIAGNOSIS — M722 Plantar fascial fibromatosis: Secondary | ICD-10-CM | POA: Insufficient documentation

## 2017-08-14 DIAGNOSIS — E119 Type 2 diabetes mellitus without complications: Secondary | ICD-10-CM | POA: Diagnosis not present

## 2017-08-14 DIAGNOSIS — D101 Benign neoplasm of tongue: Secondary | ICD-10-CM | POA: Insufficient documentation

## 2017-08-14 DIAGNOSIS — D126 Benign neoplasm of colon, unspecified: Secondary | ICD-10-CM | POA: Insufficient documentation

## 2017-08-14 DIAGNOSIS — Z7982 Long term (current) use of aspirin: Secondary | ICD-10-CM | POA: Insufficient documentation

## 2017-08-14 DIAGNOSIS — R2243 Localized swelling, mass and lump, lower limb, bilateral: Secondary | ICD-10-CM | POA: Diagnosis not present

## 2017-08-14 MED ORDER — IBUPROFEN 200 MG PO TABS: 400.0000 mg | ORAL_TABLET | Freq: Three times a day (TID) | ORAL | 0 refills | Status: DC

## 2017-08-14 NOTE — ED Notes (Signed)
Patient discharged and waiting for [facility] for transport.

## 2017-08-14 NOTE — Discharge Instructions (Signed)
If your symptoms persist, follow-up with the podiatrist.  You may need steroid injections.

## 2017-08-14 NOTE — ED Triage Notes (Signed)
Patient coming from home with c/o bilateral feet pain patient rating pain at  10/10.Patrick Holt

## 2017-08-14 NOTE — ED Provider Notes (Signed)
[facility] DEPT Provider Note   CSN: [id number] Arrival date & time: 08/14/17  1553     History   Chief Complaint No chief complaint on file.   HPI Patrick Holt is a 67 y.o. male.  HPI Patient presents with bilateral pain to the bottoms of his feet for the past few weeks.  States is worse in the last few days.  He denies any trauma.  Pain is worse with ambulation.  States that when he is able to stretch the feet or massage the bottom of the feet but this improves his symptoms.  He denies any numbness.  No focal weakness. Past Medical History:  Diagnosis Date  . Anemia   . Anxiety   . Asthma   . Atrial fibrillation ([place]) 01/2012   with RVR  . Bipolar 1 disorder ([place])   . Cancer ([place])   . Collagen vascular disease ([place])   . Conversion disorder 06/2010  . COPD (chronic obstructive pulmonary disease) ([place])   . Dermatomyositis ([place])   . Dermatomyositis ([place])   . Diverticulitis   . Diverticulosis of colon   . Dysrhythmia    "irregular" (11/15/2012)  . Esophageal dysmotility   . Esophageal stricture   . Fibromyalgia   . Gastritis   . GERD (gastroesophageal reflux disease)   . Hand fracture, right    sept 13, 2017, still wearing splint as og 01-10-2017  . Headache(784.0)    "severe; get them often" (11/15/2012)  . Hiatal hernia   . History of narcotic addiction ([place])   . Hx of adenomatous colonic polyps   . Hyperlipidemia   . Hypertension   . Internal hemorrhoids   . Ischemic heart disease   . Major depression    with acute psychotic break in 06/2010  . Myocardial infarct ([place])    mulitple (1999, 2000, 2004)  . Narcotic dependence ([place])   . Nephrolithiasis   . Obesity   . OCD (obsessive compulsive disorder)   . Otosclerosis   . Paroxysmal A-fib ([place])   . Peripheral neuropathy   . Raynaud's disease   . Renal insufficiency   . Rheumatoid arthritis(714.0)   . Sarcoidosis   . Seizures ([place])   . Subarachnoid hemorrhage ([place])  01/2012   with subdural  hematoma.   . Type II diabetes mellitus ([place])   . Urge incontinence   . Vertigo     Patient Active Problem List   Diagnosis Date Noted  . Unstable angina ([place]) 06/16/2017  . L1 and L2 vertebral fracture ([place]) 04/06/2017  . Multiple rib fractures 04/06/2017  . Evaluation by psychiatric service required 03/07/2017  . Acute metabolic encephalopathy 54/65/0354  . Hypoglycemia 03/04/2017  . Acute respiratory failure with hypoxia ([place]) 07-Jun-202019  . Acute encephalopathy 07-Jun-202019  . Rhabdomyolysis 02/05/2017  . Acute respiratory failure with hypoxia and hypercapnia ([place]) 02/05/2017  . COPD with acute exacerbation ([place]) 02/05/2017  . Bipolar 1 disorder ([place]) 02/05/2017  . Anemia 02/05/2017  . Hypertension 02/05/2017  . Hand fracture, right 02/05/2017  . Narcotic dependence ([place]) 02/05/2017  . Pre-diabetes 02/05/2017  . PAF (paroxysmal atrial fibrillation) ([place]) 02/05/2017  . Frequent falls 02/05/2017  . History of dermatomyositis 02/05/2017  . Seizure disorder ([place]) 02/05/2017  . Bilateral lower extremity edema 02/05/2017  . Pulmonary artery hypertension ([place]) 01/15/2017  . Altered mental status 01/13/2017  . Low oxygen saturation   . Pain in right wrist 02/27/2016  . Altered mental state 11/14/2015  . Severe dehydration 11/14/2015  .  Hypernatremia 11/14/2015  . Difficulty in walking, not elsewhere classified   . Right wrist fracture   . Closed displaced fracture of ulnar styloid with routine healing   . Scapholunate ligament injury with no instability   . Falls frequently   . Hypoxia 11/02/2015  . Anxiety 06/06/2015  . Impaired mobility 04/28/2015  . Lumbar spondylosis with myelopathy 03/07/2015  . Nocturia 12/03/2014  . Chronic venous insufficiency 09/23/2014  . Poor social situation 07/30/2014  . Schizophrenia, acute ([place])   . Schizoaffective disorder, bipolar type ([place])   . Decreased visual acuity 06/27/2014  . COPD exacerbation ([place])   .  Right sided weakness 01/04/2014  . Essential hypertension, benign   . Atrial fibrillation, unspecified   . Radiculopathy of cervical region   . Polyuria 12/07/2013  . Erectile dysfunction 12/07/2013  . Diverticulitis 08/01/2013  . Acute diverticulitis 08/01/2013  . Hematochezia 06/04/2013  . Hypokalemia 10/03/2012  . Benign neoplasm of colon 09/27/2012  . Stricture and stenosis of esophagus 09/27/2012  . Diverticulitis of colon without hemorrhage 07/26/2012  . Polypharmacy 03/26/2012  . Seizures ([place]) 02/10/2012  . Atrial fibrillation ([place]) 02/05/2012  . Coronary atherosclerosis of native coronary artery 02/05/2012  . Radicular low back pain 03/09/2011  . History of seizure disorder 01/29/2011  . Irritable bowel syndrome (IBS) 06/24/2010  . Urge incontinence 06/19/2010  . Tobacco abuse 11/23/2008  . DEPRESSION, MAJOR 05/31/2008  . ISCHEMIC HEART DISEASE 05/31/2008  . RAYNAUD'S DISEASE 05/31/2008  . HIATAL HERNIA 05/31/2008  . Rheumatoid arthritis ([place]) 05/31/2008  . FIBROMYALGIA 05/31/2008  . Hyperlipidemia 01/26/2008  . Anxiety state 01/26/2008  . HEMORRHOIDS, INTERNAL 01/26/2008  . COPD (chronic obstructive pulmonary disease) ([place]) 01/26/2008  . ESOPHAGEAL MOTILITY DISORDER 01/26/2008  . GERD 01/26/2008  . Dermatomyositis ([place]) 01/26/2008    Past Surgical History:  Procedure Laterality Date  . BACK SURGERY    . CARDIAC CATHETERIZATION    . CARPAL TUNNEL RELEASE Bilateral   . CATARACT EXTRACTION W/ INTRAOCULAR LENS IMPLANT Left   . COLONOSCOPY N/A 09/27/2012   Procedure: COLONOSCOPY;  Surgeon: Lafayette Dragon, MD;  Location: [facility] ENDOSCOPY;  Service: Endoscopy;  Laterality: N/A;  . ESOPHAGOGASTRODUODENOSCOPY N/A 09/27/2012   Procedure: ESOPHAGOGASTRODUODENOSCOPY (EGD);  Surgeon: Lafayette Dragon, MD;  Location: [facility];  Service: Endoscopy;  Laterality: N/A;  . KNEE ARTHROSCOPY W/ MENISCAL REPAIR Left 2009  . LUMBAR DISC SURGERY    . LYMPH NODE DISSECTION Right 1970's    "neck; dr thought I had Hodgkins; turned out to be sarcoidosis" (11/15/2012)  . squamous papilloma   2010   removed by Dr. Constance Holster ENT, noted on tongue  . TONSILLECTOMY          Home Medications    Prior to Admission medications   Medication Sig Start Date End Date Taking? Authorizing Provider  ALPRAZolam Duanne Moron) 1 MG tablet Take 1 mg by mouth 3 (three) times daily. 01/10/17   [provider]  amiodarone (PACERONE) 200 MG tablet Take 1 tablet (200 mg total) by mouth daily. 02/10/17   Alma Friendly, MD  amLODipine (NORVASC) 5 MG tablet Take 1 tablet (5 mg total) by mouth daily. 01/19/17   Velvet Bathe, MD  aspirin EC 81 MG tablet Take 1 tablet (81 mg total) by mouth daily. 10/17/15   [name], MD  atorvastatin (LIPITOR) 40 MG tablet Take 1 tablet (40 mg total) by mouth daily. 01/19/17   Velvet Bathe, MD  budesonide-formoterol [facility]) 160-4.5 MCG/ACT inhaler Inhale 2 puffs into the lungs  2 (two) times daily. 01/19/17   Velvet Bathe, MD  divalproex (DEPAKOTE) 250 MG DR tablet Take 250 mg by mouth 3 (three) times daily.  03/16/17   [provider]  ergocalciferol (VITAMIN D2) 50000 units capsule Take 50,000 Units by mouth once a week.    [provider]  esomeprazole (NEXIUM) 40 MG capsule Take 40 mg by mouth daily. 07/26/16   [provider]  furosemide (LASIX) 20 MG tablet Take 20 mg by mouth daily.    [provider]  ibuprofen (ADVIL,MOTRIN) 200 MG tablet Take 2 tablets (400 mg total) by mouth 3 (three) times daily after meals. 08/14/17   Julianne Rice, MD  ipratropium-albuterol (DUONEB) 0.5-2.5 (3) MG/3ML SOLN Inhale 3 mLs into the lungs every 6 (six) hours as needed. Patient taking differently: Inhale 3 mLs into the lungs every 6 (six) hours as needed (for breathing).  11/17/15   Elgergawy, Silver Huguenin, MD  levETIRAcetam (KEPPRA) 500 MG tablet Take 500 mg by mouth 2 (two) times daily.    [provider]  loperamide (IMODIUM) 2  MG capsule Take 2 mg by mouth as needed for diarrhea or loose stools.    [provider]  Multiple Vitamin (MULTIVITAMIN WITH MINERALS) TABS tablet Take 1 tablet by mouth daily. 10/17/15   [name], MD  oxybutynin (DITROPAN) 5 MG tablet Take 5 mg by mouth 3 (three) times daily.  07/26/16   [provider]  potassium chloride 20 MEQ TBCR Take 20 mEq by mouth daily. 02/23/17   Domenic Polite, MD  risperiDONE (RISPERDAL) 1 MG tablet Take 1 mg by mouth 2 (two) times daily. 03/25/17   [provider]  traMADol (ULTRAM) 50 MG tablet Take 50 mg by mouth every 6 (six) hours as needed for moderate pain.    [provider]    Family History Family History  Problem Relation Age of Onset  . Alcohol abuse Mother   . Depression Mother   . Heart disease Mother   . Diabetes Mother   . Stroke Mother   . Coronary artery disease Father   . Diabetes Other        1/2 brother  . Hepatitis Brother     Social History Social History   Tobacco Use  . Smoking status: Light Tobacco Smoker    Packs/day: 0.50    Years: 30.00    Pack years: 15.00    Types: Cigarettes    Last attempt to quit: 12/15/2014    Years since quitting: 2.6  . Smokeless tobacco: Never Used  . Tobacco comment: 1/2 cig BID  Substance Use Topics  . Alcohol use: No    Alcohol/week: 0.0 oz    Comment: Alcohol stopped in September of 2014  . Drug use: No     Allergies   Immune globulins; Ciprofloxacin; Flagyl [metronidazole]; Lisinopril; Sulfa antibiotics; Cefepime; Morphine and related; Requip [ropinirole hcl]; Vancomycin; Betamethasone dipropionate; Bupropion hcl; Clobetasol; Codeine; Escitalopram oxalate; Fluoxetine hcl; Meclizine; Paroxetine; Penicillins; Tacrolimus; Tetanus toxoid; and Tuberculin purified protein derivative   Review of Systems Review of Systems  Constitutional: Negative for chills and fever.  Respiratory: Negative for shortness of breath.   Cardiovascular: Negative for leg  swelling.  Musculoskeletal: Positive for arthralgias.  Skin: Negative for rash and wound.  Neurological: Negative for weakness and numbness.  All other systems reviewed and are negative.    Physical Exam Updated Vital Signs BP 120/74 (BP Location: Right Arm)   Pulse (!) 59   Temp 97.7  F (36.5 C) (Oral)   Resp 18   SpO2 92%   Physical Exam  Constitutional: He is oriented to person, place, and time. He appears well-developed and well-nourished. No distress.  HENT:  Head: Normocephalic and atraumatic.  Eyes: Pupils are equal, round, and reactive to light. EOM are normal.  Neck: Normal range of motion. Neck supple.  Cardiovascular: Normal rate and regular rhythm.  Pulmonary/Chest: Effort normal and breath sounds normal. No respiratory distress. He has no wheezes. He has no rales.  Abdominal: Soft. Bowel sounds are normal.  Musculoskeletal: Normal range of motion. He exhibits tenderness. He exhibits no edema.  Bilateral dorsalis pedis and posterior tibial pulses palpated.  Patient has mild 1+ bilateral lower extremity edema without erythema or warmth.  No calf asymmetry or tenderness.  Patient has point tenderness at the anterior border of the calcaneus on the plantar surface bilaterally.  There is no evidence of trauma, swelling or deformity.  Neurological: He is alert and oriented to person, place, and time.  Sensation to light touch intact.  Moving all extremities without focal deficit.  Skin: Skin is warm and dry. No rash noted. No erythema.  Psychiatric: He has a normal mood and affect. His behavior is normal.  Nursing note and vitals reviewed.    ED Treatments / Results  Labs (all labs ordered are listed, but only abnormal results are displayed) Labs Reviewed - No data to display  EKG None  Radiology No results found.  Procedures Procedures (including critical care time)  Medications Ordered in ED Medications - No data to display   Initial Impression /  Assessment and Plan / ED Course  I have reviewed the triage vital signs and the nursing notes.  Pertinent labs & imaging results that were available during my care of the patient were reviewed by me and considered in my medical decision making (see chart for details).     Low suspicion for acute injury.  Suspect plantar fasciitis.  Patient is already taking low-dose ibuprofen.  Will mildly increase the dose and have patient take after eating.  Patient encouraged to continue to stretch the feet, wear supportive socks, rest and elevate.  If patient's symptoms continue he understands he will need to follow-up with podiatry and may need localized steroid injections.  Final Clinical Impressions(s) / ED Diagnoses   Final diagnoses:  Plantar fasciitis    ED Discharge Orders        Ordered    ibuprofen (ADVIL,MOTRIN) 200 MG tablet  3 times daily after meals     08/14/17 1626       Julianne Rice, MD 08/14/17 [phone]

## 2017-09-03 ENCOUNTER — Inpatient Hospital Stay (HOSPITAL_COMMUNITY)
Admission: EM | Admit: 2017-09-03 | Discharge: 2017-09-07 | DRG: 689 | Disposition: A | Discharge status: Skilled Nursing Facility | Payer: Medicare Other | Attending: Internal Medicine | Admitting: Internal Medicine

## 2017-09-03 ENCOUNTER — Emergency Department (HOSPITAL_COMMUNITY): Payer: Medicare Other

## 2017-09-03 ENCOUNTER — Other Ambulatory Visit: Payer: Self-pay

## 2017-09-03 DIAGNOSIS — J449 Chronic obstructive pulmonary disease, unspecified: Secondary | ICD-10-CM | POA: Diagnosis present

## 2017-09-03 DIAGNOSIS — I480 Paroxysmal atrial fibrillation: Secondary | ICD-10-CM | POA: Diagnosis present

## 2017-09-03 DIAGNOSIS — M797 Fibromyalgia: Secondary | ICD-10-CM | POA: Diagnosis present

## 2017-09-03 DIAGNOSIS — Z79899 Other long term (current) drug therapy: Secondary | ICD-10-CM | POA: Diagnosis not present

## 2017-09-03 DIAGNOSIS — J410 Simple chronic bronchitis: Secondary | ICD-10-CM

## 2017-09-03 DIAGNOSIS — I2510 Atherosclerotic heart disease of native coronary artery without angina pectoris: Secondary | ICD-10-CM | POA: Diagnosis present

## 2017-09-03 DIAGNOSIS — F429 Obsessive-compulsive disorder, unspecified: Secondary | ICD-10-CM | POA: Diagnosis present

## 2017-09-03 DIAGNOSIS — Z7951 Long term (current) use of inhaled steroids: Secondary | ICD-10-CM

## 2017-09-03 DIAGNOSIS — G9341 Metabolic encephalopathy: Secondary | ICD-10-CM | POA: Diagnosis present

## 2017-09-03 DIAGNOSIS — G40909 Epilepsy, unspecified, not intractable, without status epilepticus: Secondary | ICD-10-CM | POA: Diagnosis present

## 2017-09-03 DIAGNOSIS — E785 Hyperlipidemia, unspecified: Secondary | ICD-10-CM | POA: Diagnosis present

## 2017-09-03 DIAGNOSIS — M069 Rheumatoid arthritis, unspecified: Secondary | ICD-10-CM | POA: Diagnosis present

## 2017-09-03 DIAGNOSIS — Z7982 Long term (current) use of aspirin: Secondary | ICD-10-CM

## 2017-09-03 DIAGNOSIS — M3310 Other dermatopolymyositis, organ involvement unspecified: Secondary | ICD-10-CM | POA: Diagnosis present

## 2017-09-03 DIAGNOSIS — N179 Acute kidney failure, unspecified: Secondary | ICD-10-CM | POA: Diagnosis present

## 2017-09-03 DIAGNOSIS — R609 Edema, unspecified: Secondary | ICD-10-CM | POA: Diagnosis not present

## 2017-09-03 DIAGNOSIS — K219 Gastro-esophageal reflux disease without esophagitis: Secondary | ICD-10-CM | POA: Diagnosis present

## 2017-09-03 DIAGNOSIS — I5032 Chronic diastolic (congestive) heart failure: Secondary | ICD-10-CM | POA: Diagnosis present

## 2017-09-03 DIAGNOSIS — Z8673 Personal history of transient ischemic attack (TIA), and cerebral infarction without residual deficits: Secondary | ICD-10-CM | POA: Diagnosis not present

## 2017-09-03 DIAGNOSIS — R0902 Hypoxemia: Secondary | ICD-10-CM

## 2017-09-03 DIAGNOSIS — E119 Type 2 diabetes mellitus without complications: Secondary | ICD-10-CM | POA: Diagnosis present

## 2017-09-03 DIAGNOSIS — I252 Old myocardial infarction: Secondary | ICD-10-CM

## 2017-09-03 DIAGNOSIS — Z833 Family history of diabetes mellitus: Secondary | ICD-10-CM

## 2017-09-03 DIAGNOSIS — R4182 Altered mental status, unspecified: Secondary | ICD-10-CM | POA: Diagnosis not present

## 2017-09-03 DIAGNOSIS — G9340 Encephalopathy, unspecified: Secondary | ICD-10-CM

## 2017-09-03 DIAGNOSIS — Z7401 Bed confinement status: Secondary | ICD-10-CM | POA: Diagnosis not present

## 2017-09-03 DIAGNOSIS — I82491 Acute embolism and thrombosis of other specified deep vein of right lower extremity: Secondary | ICD-10-CM | POA: Diagnosis present

## 2017-09-03 DIAGNOSIS — N390 Urinary tract infection, site not specified: Principal | ICD-10-CM | POA: Diagnosis present

## 2017-09-03 DIAGNOSIS — I10 Essential (primary) hypertension: Secondary | ICD-10-CM

## 2017-09-03 DIAGNOSIS — G7100 Muscular dystrophy, unspecified: Secondary | ICD-10-CM | POA: Diagnosis present

## 2017-09-03 DIAGNOSIS — F17210 Nicotine dependence, cigarettes, uncomplicated: Secondary | ICD-10-CM | POA: Diagnosis present

## 2017-09-03 DIAGNOSIS — R531 Weakness: Secondary | ICD-10-CM

## 2017-09-03 DIAGNOSIS — R296 Repeated falls: Secondary | ICD-10-CM | POA: Diagnosis present

## 2017-09-03 DIAGNOSIS — J9601 Acute respiratory failure with hypoxia: Secondary | ICD-10-CM | POA: Diagnosis present

## 2017-09-03 DIAGNOSIS — I110 Hypertensive heart disease with heart failure: Secondary | ICD-10-CM | POA: Diagnosis present

## 2017-09-03 LAB — BLOOD GAS, ARTERIAL
ACID-BASE EXCESS: 3.8 mmol/L — AB (ref 0.0–2.0)
BICARBONATE: 29.1 mmol/L — AB (ref 20.0–28.0)
DRAWN BY: 232811
O2 CONTENT: 2 L/min
O2 Saturation: 89.3 %
PATIENT TEMPERATURE: 98.2
PO2 ART: 60.3 mmHg — AB (ref 83.0–108.0)
pCO2 arterial: 48.8 mmHg — ABNORMAL HIGH (ref 32.0–48.0)
pH, Arterial: 7.392 (ref 7.350–7.450)

## 2017-09-03 LAB — URINALYSIS, ROUTINE W REFLEX MICROSCOPIC
BACTERIA UA: NONE SEEN
Bilirubin Urine: NEGATIVE
Glucose, UA: NEGATIVE mg/dL
KETONES UR: NEGATIVE mg/dL
Leukocytes, UA: NEGATIVE
Nitrite: NEGATIVE
PROTEIN: NEGATIVE mg/dL
Specific Gravity, Urine: 1.017 (ref 1.005–1.030)
pH: 6 (ref 5.0–8.0)

## 2017-09-03 LAB — CBC WITH DIFFERENTIAL/PLATELET
Basophils Absolute: 0 10*3/uL (ref 0.0–0.1)
Basophils Relative: 0 %
Eosinophils Absolute: 0.2 10*3/uL (ref 0.0–0.7)
Eosinophils Relative: 3 %
HEMATOCRIT: 38 % — AB (ref 39.0–52.0)
HEMOGLOBIN: 11.9 g/dL — AB (ref 13.0–17.0)
LYMPHS ABS: 2.2 10*3/uL (ref 0.7–4.0)
LYMPHS PCT: 42 %
MCH: 26.7 pg (ref 26.0–34.0)
MCHC: 31.3 g/dL (ref 30.0–36.0)
MCV: 85.4 fL (ref 78.0–100.0)
MONOS PCT: 12 %
Monocytes Absolute: 0.6 10*3/uL (ref 0.1–1.0)
NEUTROS ABS: 2.3 10*3/uL (ref 1.7–7.7)
NEUTROS PCT: 43 %
Platelets: 244 10*3/uL (ref 150–400)
RBC: 4.45 MIL/uL (ref 4.22–5.81)
RDW: 19.6 % — ABNORMAL HIGH (ref 11.5–15.5)
WBC: 5.2 10*3/uL (ref 4.0–10.5)

## 2017-09-03 LAB — COMPREHENSIVE METABOLIC PANEL
ALBUMIN: 2.9 g/dL — AB (ref 3.5–5.0)
ALT: 7 U/L (ref 0–44)
AST: 18 U/L (ref 15–41)
Alkaline Phosphatase: 53 U/L (ref 38–126)
Anion gap: 8 (ref 5–15)
BUN: 14 mg/dL (ref 8–23)
CHLORIDE: 107 mmol/L (ref 98–111)
CO2: 27 mmol/L (ref 22–32)
Calcium: 8.7 mg/dL — ABNORMAL LOW (ref 8.9–10.3)
Creatinine, Ser: 1.36 mg/dL — ABNORMAL HIGH (ref 0.61–1.24)
GFR calc Af Amer: >60 mL/min (ref >60)
GFR calc non Af Amer: 52 mL/min — ABNORMAL LOW (ref >60)
GLUCOSE: 88 mg/dL (ref 70–99)
POTASSIUM: 4.7 mmol/L (ref 3.5–5.1)
SODIUM: 142 mmol/L (ref 135–145)
Total Bilirubin: 1.2 mg/dL (ref 0.3–1.2)
Total Protein: 5.7 g/dL — ABNORMAL LOW (ref 6.5–8.1)

## 2017-09-03 LAB — CK: Total CK: 75 U/L (ref 49–397)

## 2017-09-03 LAB — BRAIN NATRIURETIC PEPTIDE: B Natriuretic Peptide: 36.1 pg/mL (ref 0.0–100.0)

## 2017-09-03 MED ORDER — TRAMADOL HCL 50 MG PO TABS
50.0000 mg | ORAL_TABLET | Freq: Once | ORAL | Status: DC
  Filled 2017-09-03: qty 1

## 2017-09-03 NOTE — ED Triage Notes (Signed)
Patient presented to ed with c/o generalized weakness. Patient state he have not been able to get up and take his medication or get up to do anything due to generalized weakness and body aches.

## 2017-09-03 NOTE — ED Provider Notes (Signed)
[facility] DEPT Provider Note   CSN: [id number] Arrival date & time: 09/03/17  1640     History   Chief Complaint Chief Complaint  Patient presents with  . Generalized Body Aches    HPI Patrick Holt is a 67 y.o. male presenting for evaluation of generalized body aches.  Patient states he is having generalized body pain, to the point where he was unable to get out of his bed to his walker which is right next to his bed.  He is unable to take any of his medications due to being bedbound.  He called EMS to help him, and they said he needed to come to the hospital for further evaluation.  Patient states he is history of a form of muscular dystrophy, has days where it flares and he has worsened weakness.   States this feels similar.  He reports a cough over the past several weeks, mildly productive.  He reports increasing bilateral leg edema.  States he is not on a fluid pill.  He denies recent fevers, chills, chest pain, shortness of breath, nausea, vomiting, abdominal pain, urinary symptoms, abnormal bowel movements.  Patient states he has home health who is there for a few hours on Monday, Wednesday, and Friday.  However states that his home health aide is not there for as long as she is getting paid.  He has tried to get into assisted living, but not had success.  Walks with a walker at baseline.  He is not on oxygen at baseline.  Medical history significant for asthma, A. Fib (not on blood thinners), bipolar, COPD, CAD, fibromyalgia, RA, subarachnoid hemorrhage, diabetes.  HPI  Past Medical History:  Diagnosis Date  . Anemia   . Anxiety   . Asthma   . Atrial fibrillation ([place]) 01/2012   with RVR  . Bipolar 1 disorder ([place])   . Cancer ([place])   . Collagen vascular disease ([place])   . Conversion disorder 06/2010  . COPD (chronic obstructive pulmonary disease) ([place])   . Dermatomyositis ([place])   . Dermatomyositis ([place])   . Diverticulitis   .  Diverticulosis of colon   . Dysrhythmia    "irregular" (11/15/2012)  . Esophageal dysmotility   . Esophageal stricture   . Fibromyalgia   . Gastritis   . GERD (gastroesophageal reflux disease)   . Hand fracture, right    sept 13, 2017, still wearing splint as og 01-10-2017  . Headache(784.0)    "severe; get them often" (11/15/2012)  . Hiatal hernia   . History of narcotic addiction ([place])   . Hx of adenomatous colonic polyps   . Hyperlipidemia   . Hypertension   . Internal hemorrhoids   . Ischemic heart disease   . Major depression    with acute psychotic break in 06/2010  . Myocardial infarct ([place])    mulitple (1999, 2000, 2004)  . Narcotic dependence ([place])   . Nephrolithiasis   . Obesity   . OCD (obsessive compulsive disorder)   . Otosclerosis   . Paroxysmal A-fib ([place])   . Peripheral neuropathy   . Raynaud's disease   . Renal insufficiency   . Rheumatoid arthritis(714.0)   . Sarcoidosis   . Seizures ([place])   . Subarachnoid hemorrhage ([place]) 01/2012   with subdural  hematoma.   . Type II diabetes mellitus ([place])   . Urge incontinence   . Vertigo     Patient Active Problem List   Diagnosis Date Noted  .  Unstable angina ([place]) 06/16/2017  . L1 and L2 vertebral fracture ([place]) 04/06/2017  . Multiple rib fractures 04/06/2017  . Evaluation by psychiatric service required 03/07/2017  . Acute metabolic encephalopathy 64/40/3474  . Hypoglycemia 03/04/2017  . Acute respiratory failure with hypoxia ([place]) 2020/11/2717  . Acute encephalopathy 2020/11/2717  . Rhabdomyolysis 02/05/2017  . Acute respiratory failure with hypoxia and hypercapnia ([place]) 02/05/2017  . COPD with acute exacerbation ([place]) 02/05/2017  . Bipolar 1 disorder ([place]) 02/05/2017  . Anemia 02/05/2017  . Hypertension 02/05/2017  . Hand fracture, right 02/05/2017  . Narcotic dependence ([place]) 02/05/2017  . Pre-diabetes 02/05/2017  . PAF (paroxysmal atrial fibrillation) ([place]) 02/05/2017  . Frequent falls 02/05/2017    . History of dermatomyositis 02/05/2017  . Seizure disorder ([place]) 02/05/2017  . Bilateral lower extremity edema 02/05/2017  . Pulmonary artery hypertension ([place]) 01/15/2017  . Altered mental status 01/13/2017  . Low oxygen saturation   . Pain in right wrist 02/27/2016  . Altered mental state 11/14/2015  . Severe dehydration 11/14/2015  . Hypernatremia 11/14/2015  . Difficulty in walking, not elsewhere classified   . Right wrist fracture   . Closed displaced fracture of ulnar styloid with routine healing   . Scapholunate ligament injury with no instability   . Falls frequently   . Hypoxia 11/02/2015  . Anxiety 06/06/2015  . Impaired mobility 04/28/2015  . Lumbar spondylosis with myelopathy 03/07/2015  . Nocturia 12/03/2014  . Chronic venous insufficiency 09/23/2014  . Poor social situation 07/30/2014  . Schizophrenia, acute ([place])   . Schizoaffective disorder, bipolar type ([place])   . Decreased visual acuity 06/27/2014  . COPD exacerbation ([place])   . Right sided weakness 01/04/2014  . Essential hypertension, benign   . Atrial fibrillation, unspecified   . Radiculopathy of cervical region   . Polyuria 12/07/2013  . Erectile dysfunction 12/07/2013  . Diverticulitis 08/01/2013  . Acute diverticulitis 08/01/2013  . Hematochezia 06/04/2013  . Hypokalemia 10/03/2012  . Benign neoplasm of colon 09/27/2012  . Stricture and stenosis of esophagus 09/27/2012  . Diverticulitis of colon without hemorrhage 07/26/2012  . Polypharmacy 03/26/2012  . Seizures ([place]) 02/10/2012  . Atrial fibrillation ([place]) 02/05/2012  . Coronary atherosclerosis of native coronary artery 02/05/2012  . Radicular low back pain 03/09/2011  . History of seizure disorder 01/29/2011  . Irritable bowel syndrome (IBS) 06/24/2010  . Urge incontinence 06/19/2010  . Tobacco abuse 11/23/2008  . DEPRESSION, MAJOR 05/31/2008  . ISCHEMIC HEART DISEASE 05/31/2008  . RAYNAUD'S DISEASE 05/31/2008  . HIATAL HERNIA 05/31/2008   . Rheumatoid arthritis ([place]) 05/31/2008  . FIBROMYALGIA 05/31/2008  . Hyperlipidemia 01/26/2008  . Anxiety state 01/26/2008  . HEMORRHOIDS, INTERNAL 01/26/2008  . COPD (chronic obstructive pulmonary disease) ([place]) 01/26/2008  . ESOPHAGEAL MOTILITY DISORDER 01/26/2008  . GERD 01/26/2008  . Dermatomyositis ([place]) 01/26/2008    Past Surgical History:  Procedure Laterality Date  . BACK SURGERY    . CARDIAC CATHETERIZATION    . CARPAL TUNNEL RELEASE Bilateral   . CATARACT EXTRACTION W/ INTRAOCULAR LENS IMPLANT Left   . COLONOSCOPY N/A 09/27/2012   Procedure: COLONOSCOPY;  Surgeon: Lafayette Dragon, MD;  Location: [facility] END[facility]COPY;  Service: Endoscopy;  Laterality: N/A;  . ESOPHAGOGASTRODUODENOSCOPY N/A 09/27/2012   Procedure: ESOPHAGOGASTRODUODENOSCOPY (EGD);  Surgeon: Lafayette Dragon, MD;  Location: [facility];  Service: Endoscopy;  Laterality: N/A;  . KNEE ARTHROSCOPY W/ MENISCAL REPAIR Left 2009  . LUMBAR DISC SURGERY    . LYMPH NODE DISSECTION Right 1970's   "neck; dr thought  I had Hodgkins; turned out to be sarcoidosis" (11/15/2012)  . squamous papilloma   2010   removed by Dr. Constance Holster ENT, noted on tongue  . TONSILLECTOMY          Home Medications    Prior to Admission medications   Medication Sig Start Date End Date Taking? Authorizing Provider  ALPRAZolam (XANAX) 0.5 MG tablet TAKE one tablet (0.5 MG) BY MOUTH EVERY 8 HOURS 08/15/17  Yes [provider]  atorvastatin (LIPITOR) 40 MG tablet Take 1 tablet (40 mg total) by mouth daily. 01/19/17  Yes Velvet Bathe, MD  divalproex (DEPAKOTE) 250 MG DR tablet Take 250 mg by mouth 3 (three) times daily.  03/16/17  Yes [provider]  levETIRAcetam (KEPPRA) 500 MG tablet Take 500 mg by mouth 2 (two) times daily.   Yes [provider]  oxybutynin (DITROPAN) 5 MG tablet Take 5 mg by mouth 3 (three) times daily.  07/26/16  Yes [provider]  risperiDONE (RISPERDAL) 1 MG tablet Take 1 mg by mouth 2 (two) times  daily. 03/25/17  Yes [provider]  ALPRAZolam Duanne Moron) 1 MG tablet Take 1 mg by mouth 3 (three) times daily. 01/10/17   [provider]  amiodarone (PACERONE) 200 MG tablet Take 1 tablet (200 mg total) by mouth daily. 02/10/17   Alma Friendly, MD  amLODipine (NORVASC) 5 MG tablet Take 1 tablet (5 mg total) by mouth daily. 01/19/17   Velvet Bathe, MD  aspirin EC 81 MG tablet Take 1 tablet (81 mg total) by mouth daily. 10/17/15   [name], MD  budesonide-formoterol (SYMBICORT) 160-4.5 MCG/ACT inhaler Inhale 2 puffs into the lungs 2 (two) times daily. 01/19/17   Velvet Bathe, MD  ergocalciferol (VITAMIN D2) 50000 units capsule Take 50,000 Units by mouth once a week.    [provider]  esomeprazole (NEXIUM) 40 MG capsule Take 40 mg by mouth daily. 07/26/16   [provider]  furosemide (LASIX) 20 MG tablet Take 20 mg by mouth daily.    [provider]  ibuprofen (ADVIL,MOTRIN) 200 MG tablet Take 2 tablets (400 mg total) by mouth 3 (three) times daily after meals. 08/14/17   Julianne Rice, MD  ipratropium-albuterol (DUONEB) 0.5-2.5 (3) MG/3ML SOLN Inhale 3 mLs into the lungs every 6 (six) hours as needed. Patient taking differently: Inhale 3 mLs into the lungs every 6 (six) hours as needed (for breathing).  11/17/15   [place]gergawy, Silver Huguenin, MD  loperamide (IMODIUM) 2 MG capsule Take 2 mg by mouth as needed for diarrhea or loose stools.    [provider]  Multiple Vitamin (MULTIVITAMIN WITH MINERALS) TABS tablet Take 1 tablet by mouth daily. 10/17/15   [name], MD  potassium chloride 20 MEQ TBCR Take 20 mEq by mouth daily. 02/23/17   Domenic Polite, MD  potassium chloride SA (K-DUR,KLOR-CON) 20 MEQ tablet TAKE ONE TABLET (20 mEq) BY MOUTH EVERY OTHER DAY 08/06/17   [provider]  traMADol (ULTRAM) 50 MG tablet Take 50 mg by mouth every 6 (six) hours as needed for moderate pain.    [provider]    Family  History Family History  Problem Relation Age of Onset  . Alcohol abuse Mother   . Depression Mother   . Heart disease Mother   . Diabetes Mother   . Stroke Mother   . Coronary artery disease Father   . Diabetes Other        1/2 brother  .  Hepatitis Brother     Social History Social History   Tobacco Use  . Smoking status: Light Tobacco Smoker    Packs/day: 0.50    Years: 30.00    Pack years: 15.00    Types: Cigarettes    Last attempt to quit: 12/15/2014    Years since quitting: 2.7  . Smokeless tobacco: Never Used  . Tobacco comment: 1/2 cig BID  Substance Use Topics  . Alcohol use: No    Alcohol/week: 0.0 oz    Comment: Alcohol stopped in September of 2014  . Drug use: No     Allergies   Immune globulins; Ciprofloxacin; Flagyl [metronidazole]; Lisinopril; Sulfa antibiotics; Cefepime; Morphine and related; Requip [ropinirole hcl]; Vancomycin; Betamethasone dipropionate; Bupropion hcl; Clobetasol; Codeine; Escitalopram oxalate; Fluoxetine hcl; Meclizine; Paroxetine; Penicillins; Tacrolimus; Tetanus toxoid; and Tuberculin purified protein derivative   Review of Systems Review of Systems  Respiratory: Positive for cough.   Musculoskeletal: Positive for myalgias.  Neurological: Positive for weakness.  All other systems reviewed and are negative.    Physical Exam Updated Vital Signs BP 112/67   Pulse 60   Temp 98 F (36.7 C) (Oral)   Resp 14   SpO2 95%   Physical Exam  Constitutional: He is oriented to person, place, and time. He appears well-developed. No distress.  Chronically ill-appearing male in no acute distress  HENT:  Head: Normocephalic and atraumatic.  Eyes: Pupils are equal, round, and reactive to light. Conjunctivae and EOM are normal.  Neck: Normal range of motion. Neck supple.  Cardiovascular: Normal rate, regular rhythm and intact distal pulses.  Pulmonary/Chest: Effort normal and breath sounds normal. No respiratory distress. He has no  wheezes.  Distant lung sounds in all fields, likely due to decreased inspiratory volume.  No wheezing, rales, rhonchi noted.  Abdominal: Soft. He exhibits no distension. There is no tenderness.  Musculoskeletal: Normal range of motion. He exhibits edema.  Bilateral pitting edema with chronic skin changes.  Pedal pulses intact.  Neurological: He is alert and oriented to person, place, and time. No sensory deficit.  Skin: Skin is warm and dry. Capillary refill takes less than 2 seconds.  Psychiatric: He has a normal mood and affect.  Nursing note and vitals reviewed.    ED Treatments / Results  Labs (all labs ordered are listed, but only abnormal results are displayed) Labs Reviewed  COMPREHENSIVE METABOLIC PANEL - Abnormal; Notable for the following components:      Result Value   Creatinine, Ser 1.36 (*)    Calcium 8.7 (*)    Total Protein 5.7 (*)    Albumin 2.9 (*)    GFR calc non Af Amer 52 (*)    All other components within normal limits  CBC WITH DIFFERENTIAL/PLATELET - Abnormal; Notable for the following components:   Hemoglobin 11.9 (*)    HCT 38.0 (*)    RDW 19.6 (*)    All other components within normal limits  URINALYSIS, ROUTINE W REFLEX MICROSCOPIC - Abnormal; Notable for the following components:   Hgb urine dipstick SMALL (*)    All other components within normal limits  URINE CULTURE  CK  BRAIN NATRIURETIC PEPTIDE  BLOOD GAS, ARTERIAL    EKG None  Radiology Dg Chest 2 View  Result Date: 09/03/2017 CLINICAL DATA:  Generalized weakness.  Hypertension. EXAM: CHEST - 2 VIEW COMPARISON:  Jun 16, 2017 FINDINGS: There is a small pleural effusion on each side with bibasilar atelectatic change. There is no frank edema or consolidation.  Heart is upper normal in size with pulmonary vascularity normal. No adenopathy. There is thoracolumbar levoscoliosis. IMPRESSION: Small pleural effusions with bibasilar atelectasis. No frank consolidation. Stable cardiac silhouette.  Electronically Signed   By: Lowella Grip III M.D.   On: 09/03/2017 18:21   Ct Head Wo Contrast  Result Date: 09/03/2017 CLINICAL DATA:  67 year old male with altered mental status. EXAM: CT HEAD WITHOUT CONTRAST TECHNIQUE: Contiguous axial images were obtained from the base of the skull through the vertex without intravenous contrast. COMPARISON:  Head CT dated 06/16/2017 FINDINGS: Brain: There is mild age-related atrophy and chronic microvascular ischemic changes. There is no acute intracranial hemorrhage. No mass effect or midline shift noted. No extra-axial fluid collection. Vascular: No hyperdense vessel or unexpected calcification. Skull: Normal. Negative for fracture or focal lesion. Sinuses/Orbits: No acute finding. Other: None IMPRESSION: 1. No acute intracranial pathology. 2. Age-related atrophy and chronic microvascular ischemic changes. Electronically Signed   By: Anner Crete M.D.   On: 09/03/2017 21:50    Procedures Procedures (including critical care time)  Medications Ordered in ED Medications - No data to display   Initial Impression / Assessment and Plan / ED Course  I have reviewed the triage vital signs and the nursing notes.  Pertinent labs & imaging results that were available during my care of the patient were reviewed by me and considered in my medical decision making (see chart for details).     Patient presenting for evaluation of weakness.  Physical exam shows patient who is very sleepy and easily confused.  Appears in no distress.  Sats in the low to mid 90s on 2 L of oxygen, does not wear oxygen at home.  Concern for possible infection including pneumonia or UTI.  Will obtain labs, chest x-ray, and UA for further evaluation.  Additionally, bilateral leg swelling, this does not appear new, will obtain BMP due to weakness and leg swelling.  BNP normal.  CMP shows mild AKI, baseline 0.83, now 1.36 UA negative for infection.  CBC without leukocytosis,  hemoglobin stable.  CK normal.  Chest x-ray shows mild bibasilar effusions with stable cardiomegaly, consistent with previous chest x-rays.  Case discussed with attending, Dr. Darl Householder evaluated the patient.  CT head pending.  CT head without concerning findings.  I am concerned for an acute encephalopathy, and I do not feel that this patient is appropriate for discharge.  Will call for admission.  Discussed with Dr. Ara Kussmaul from [facility] hospitalist service, patient be admitted.  Requesting ABG for further evaluation.  Discussed plan with patient, who is agreeable.  chads2vasc score: 4. Not on anticoagulation due to h/o ICH  Consulted with social work and case management due to patient's home health/aide situation.   Final Clinical Impressions(s) / ED Diagnoses   Final diagnoses:  Encephalopathy  AKI (acute kidney injury) [facility])  Weakness  Hypoxia    ED Discharge Orders    None       Franchot Heidelberg, PA-C 09/03/17 2231    Drenda Freeze, MD 09/03/17 2325

## 2017-09-03 NOTE — H&P (Signed)
History and Physical   [facility] [place] [facility] [place] Admission History and Physical [employer], D.O.    Patient Name: Patrick Holt MR#: [id number] Date of Birth: 11-17-1950 Date of Admission: 09/03/2017  Referring MD/NP/PA: PA [place] Primary Care Physician: Medicine, [facility]  Chief Complaint:  Chief Complaint  Patient presents with  . Generalized Body Aches  Please note the entire history is obtained from the patient's emergency department chart, emergency department provider. Patient's personal history is limited by weakness, poor historian.    HPI: Patrick Holt is a 67 y.o. male with a known history of COPD, HTN, HLD, CAD,  Atrial fib, bipolar disorder, seizure disorder, dermatomyositis/ RA, CVA presents to the emergency department for evaluation of weakness, body aches.  Patient was in a usual state of health until several weeks ago when he reports the onset of cough, bilateral lower extremity scaling and edema.  He reports the onset of weakness over the past few days consistent with his muscular dystrophy flares. He is unable to perform his ADLs without assistance.  Has a home health aide MWF.  He complains of bilateral foot pain, fatigue, weakness.  Patient denies fevers/chills, dizziness, chest pain, shortness of breath, N/V/C/D, abdominal pain, dysuria/frequency.  Of note patient has had multiple admissions and ER visits for acute respiratory failure with hypoxia and hypercapnia, though he is not home O2 dependent.      EMS/ED Course: Medical admission has been requested for further management of AMS, unclear etiology.  Review of Systems:  CONSTITUTIONAL: Positive fatigue, weakness, Negative weight gain/loss, headache. EYES: No blurry or double vision. ENT: No tinnitus, postnasal drip, redness or soreness of the oropharynx. RESPIRATORY: Positive cough, dyspnea, wheeze.  No hemoptysis.  CARDIOVASCULAR: No chest pain,  palpitations, syncope, orthopnea.  GASTROINTESTINAL: No nausea, vomiting, abdominal pain, diarrhea, constipation.  No hematemesis, melena or hematochezia. GENITOURINARY: No dysuria, frequency, hematuria. ENDOCRINE: No polyuria or nocturia. No heat or cold intolerance. HEMATOLOGY: No anemia, bruising, bleeding. INTEGUMENTARY: No rashes, ulcers, lesions. MUSCULOSKELETAL: No arthritis, gout.  [positive lower extremity edema and pain.  NEUROLOGIC: No numbness, tingling, ataxia, seizure-type activity, weakness. PSYCHIATRIC: No anxiety, depression, insomnia.   Past Medical History:  Diagnosis Date  . Anemia   . Anxiety   . Asthma   . Atrial fibrillation ([place]) 01/2012   with RVR  . Bipolar 1 disorder ([place])   . Cancer ([place])   . Collagen vascular disease ([place])   . Conversion disorder 06/2010  . COPD (chronic obstructive pulmonary disease) ([place])   . Dermatomyositis ([place])   . Dermatomyositis ([place])   . Diverticulitis   . Diverticulosis of colon   . Dysrhythmia    "irregular" (11/15/2012)  . Esophageal dysmotility   . Esophageal stricture   . Fibromyalgia   . Gastritis   . GERD (gastroesophageal reflux disease)   . Hand fracture, right    sept 13, 2017, still wearing splint as og 01-10-2017  . Headache(784.0)    "severe; get them often" (11/15/2012)  . Hiatal hernia   . History of narcotic addiction ([place])   . Hx of adenomatous colonic polyps   . Hyperlipidemia   . Hypertension   . Internal hemorrhoids   . Ischemic heart disease   . Major depression    with acute psychotic break in 06/2010  . Myocardial infarct ([place])    mulitple (1999, 2000, 2004)  . Narcotic dependence ([place])   . Nephrolithiasis   . Obesity   . OCD (obsessive compulsive  disorder)   . Otosclerosis   . Paroxysmal A-fib ([place])   . Peripheral neuropathy   . Raynaud's disease   . Renal insufficiency   . Rheumatoid arthritis(714.0)   . Sarcoidosis   . Seizures ([place])   . Subarachnoid hemorrhage ([place]) 01/2012    with subdural  hematoma.   . Type II diabetes mellitus ([place])   . Urge incontinence   . Vertigo     Past Surgical History:  Procedure Laterality Date  . BACK SURGERY    . CARDIAC CATHETERIZATION    . CARPAL TUNNEL RELEASE Bilateral   . CATARACT EXTRACTION W/ INTRAOCULAR LENS IMPLANT Left   . COLONOSCOPY N/A 09/27/2012   Procedure: COLONOSCOPY;  Surgeon: Lafayette Dragon, MD;  Location: [facility];  Service: Endoscopy;  Laterality: N/A;  . ESOPHAGOGASTRODUODENOSCOPY N/A 09/27/2012   Procedure: ESOPHAGOGASTRODUODENOSCOPY (EGD);  Surgeon: Lafayette Dragon, MD;  Location: [facility];  Service: Endoscopy;  Laterality: N/A;  . KNEE ARTHROSCOPY W/ MENISCAL REPAIR Left 2009  . LUMBAR DISC SURGERY    . LYMPH NODE DISSECTION Right 1970's   "neck; dr thought I had Hodgkins; turned out to be sarcoidosis" (11/15/2012)  . squamous papilloma   2010   removed by Dr. Constance Holster ENT, noted on tongue  . TONSILLECTOMY       reports that he has been smoking cigarettes.  He has a 15.00 pack-year smoking history. He has never used smokeless tobacco. He reports that he does not drink alcohol or use drugs.  Allergies  Allergen Reactions  . Immune Globulins Other (See Comments)    Acute renal failure  . Ciprofloxacin Swelling  . Flagyl [Metronidazole] Swelling  . Lisinopril Diarrhea  . Sulfa Antibiotics Other (See Comments)    blisters  . Cefepime     Diffuse maculopapular rash when receiving both vanc and cefepime, timing unclear.  . Morphine And Related     Does not help with pain  . Requip [Ropinirole Hcl]     constipation  . Vancomycin     Diffuse maculopapular rash when receiving both vanc/cefepime.  Timing unclear.    . Betamethasone Dipropionate Other (See Comments)    Unknown  . Bupropion Hcl Other (See Comments)    Unknown  . Clobetasol Other (See Comments)    Unknown  . Codeine Other (See Comments)    Does not help with pain  . Escitalopram Oxalate Other (See Comments)    Unknown  .  Fluoxetine Hcl Other (See Comments)    Unknown  . Meclizine Rash  . Paroxetine Other (See Comments)    Unknown  . Penicillins Other (See Comments)    Unknown reaction Has patient had a PCN reaction causing immediate rash, facial/tongue/throat swelling, SOB or lightheadedness with hypotension: unknown Has patient had a PCN reaction causing severe rash involving mucus membranes or skin necrosis: unknown Has patient had a PCN reaction that required hospitalization unknown Has patient had a PCN reaction occurring within the last 10 years: unknown If all of the above answers are "NO", then may proceed with Cephalosporin use. Unknown Unknown reactio  . Tacrolimus Other (See Comments)    Unknown  . Tetanus Toxoid Other (See Comments)    Unknown  . Tuberculin Purified Protein Derivative Other (See Comments)    Unknown    Family History  Problem Relation Age of Onset  . Alcohol abuse Mother   . Depression Mother   . Heart disease Mother   . Diabetes Mother   . Stroke Mother   .  Coronary artery disease Father   . Diabetes Other        1/2 brother  . Hepatitis Brother     Prior to Admission medications   Medication Sig Start Date End Date Taking? Authorizing Provider  ALPRAZolam (XANAX) 0.5 MG tablet TAKE one tablet (0.5 MG) BY MOUTH EVERY 8 HOURS 08/15/17  Yes [provider]  atorvastatin (LIPITOR) 40 MG tablet Take 1 tablet (40 mg total) by mouth daily. 01/19/17  Yes Velvet Bathe, MD  divalproex (DEPAKOTE) 250 MG DR tablet Take 250 mg by mouth 3 (three) times daily.  03/16/17  Yes [provider]  levETIRAcetam (KEPPRA) 500 MG tablet Take 500 mg by mouth 2 (two) times daily.   Yes [provider]  oxybutynin (DITROPAN) 5 MG tablet Take 5 mg by mouth 3 (three) times daily.  07/26/16  Yes [provider]  risperiDONE (RISPERDAL) 1 MG tablet Take 1 mg by mouth 2 (two) times daily. 03/25/17  Yes [provider]  ALPRAZolam Duanne Moron) 1 MG tablet Take  1 mg by mouth 3 (three) times daily. 01/10/17   [provider]  amiodarone (PACERONE) 200 MG tablet Take 1 tablet (200 mg total) by mouth daily. 02/10/17   Alma Friendly, MD  amLODipine (NORVASC) 5 MG tablet Take 1 tablet (5 mg total) by mouth daily. 01/19/17   Velvet Bathe, MD  aspirin EC 81 MG tablet Take 1 tablet (81 mg total) by mouth daily. 10/17/15   [name], MD  budesonide-formoterol (SYMBICORT) 160-4.5 MCG/ACT inhaler Inhale 2 puffs into the lungs 2 (two) times daily. 01/19/17   Velvet Bathe, MD  ergocalciferol (VITAMIN D2) 50000 units capsule Take 50,000 Units by mouth once a week.    [provider]  esomeprazole (NEXIUM) 40 MG capsule Take 40 mg by mouth daily. 07/26/16   [provider]  furosemide (LASIX) 20 MG tablet Take 20 mg by mouth daily.    [provider]  ibuprofen (ADVIL,MOTRIN) 200 MG tablet Take 2 tablets (400 mg total) by mouth 3 (three) times daily after meals. 08/14/17   Julianne Rice, MD  ipratropium-albuterol (DUONEB) 0.5-2.5 (3) MG/3ML SOLN Inhale 3 mLs into the lungs every 6 (six) hours as needed. Patient taking differently: Inhale 3 mLs into the lungs every 6 (six) hours as needed (for breathing).  11/17/15   Elgergawy, Silver Huguenin, MD  loperamide (IMODIUM) 2 MG capsule Take 2 mg by mouth as needed for diarrhea or loose stools.    [provider]  Multiple Vitamin (MULTIVITAMIN WITH MINERALS) TABS tablet Take 1 tablet by mouth daily. 10/17/15   [name], MD  potassium chloride 20 MEQ TBCR Take 20 mEq by mouth daily. 02/23/17   Domenic Polite, MD  potassium chloride SA (K-DUR,KLOR-CON) 20 MEQ tablet TAKE ONE TABLET (20 mEq) BY MOUTH EVERY OTHER DAY 08/06/17   [provider]  traMADol (ULTRAM) 50 MG tablet Take 50 mg by mouth every 6 (six) hours as needed for moderate pain.    [provider]    Physical Exam: Vitals:   09/03/17 2130 09/03/17 2136 09/03/17 2145 09/03/17 2200  BP:  114/68  112/67   Pulse: 63 61 (!) 57 60  Resp: 13 17 13 14   Temp:      TempSrc:      SpO2: 92% 93% 94% 95%    GENERAL: 67 y.o.-year-old male patient, lying in the bed in no acute distress.  Lethargic, falls asleep during interview.Marland Kitchen   HEENT: Head  atraumatic, normocephalic. Pupils equal. Mucus membranes moist. NECK: Supple. No JVD. CHEST: Normal breath sounds bilaterally. No wheezing, rales, rhonchi or crackles. No use of accessory muscles of respiration.  No reproducible chest wall tenderness.  CARDIOVASCULAR: S1, S2 normal. No murmurs, rubs, or gallops. Cap refill <2 seconds. Pulses intact distally.  ABDOMEN: Soft, nondistended, nontender. No rebound, guarding, rigidity. Normoactive bowel sounds present in all four quadrants.  EXTREMITIES: Bilateral pitting lower extremity redness and edema with scaling.  Hypertrophic toenails which are tender to touch.  NEUROLOGIC: The patient is lethargic but arousable. alert and oriented x 3.    Labs on Admission:  CBC: Recent Labs  Lab 09/03/17 1901  WBC 5.2  NEUTROABS 2.3  HGB 11.9*  HCT 38.0*  MCV 85.4  PLT 644   Basic Metabolic Panel: Recent Labs  Lab 09/03/17 1901  NA 142  K 4.7  CL 107  CO2 27  GLUCOSE 88  BUN 14  CREATININE 1.36*  CALCIUM 8.7*   GFR: CrCl cannot be calculated (Unknown ideal weight.). Liver Function Tests: Recent Labs  Lab 09/03/17 1901  AST 18  ALT 7  ALKPHOS 53  BILITOT 1.2  PROT 5.7*  ALBUMIN 2.9*   No results for input(s): LIPASE, AMYLASE in the last 168 hours. No results for input(s): AMMONIA in the last 168 hours. Coagulation Profile: No results for input(s): INR, PROTIME in the last 168 hours. Cardiac Enzymes: Recent Labs  Lab 09/03/17 1901  CKTOTAL 75   BNP (last 3 results) No results for input(s): PROBNP in the last 8760 hours. HbA1C: No results for input(s): HGBA1C in the last 72 hours. CBG: No results for input(s): GLUCAP in the last 168 hours. Lipid Profile: No results for  input(s): CHOL, HDL, LDLCALC, TRIG, CHOLHDL, LDLDIRECT in the last 72 hours. Thyroid Function Tests: No results for input(s): TSH, T4TOTAL, FREET4, T3FREE, THYROIDAB in the last 72 hours. Anemia Panel: No results for input(s): VITAMINB12, FOLATE, FERRITIN, TIBC, IRON, RETICCTPCT in the last 72 hours. Urine analysis:    Component Value Date/Time   COLORURINE YELLOW 09/03/2017 2045   APPEARANCEUR CLEAR 09/03/2017 2045   LABSPEC 1.017 09/03/2017 2045   PHURINE 6.0 09/03/2017 2045   GLUCOSEU NEGATIVE 09/03/2017 2045   HGBUR SMALL (A) 09/03/2017 2045   HGBUR negative 12/11/2009 1356   BILIRUBINUR NEGATIVE 09/03/2017 2045   BILIRUBINUR MODERATE 06/05/2015 1407   KETONESUR NEGATIVE 09/03/2017 2045   PROTEINUR NEGATIVE 09/03/2017 2045   UROBILINOGEN 2.0 06/05/2015 1407   UROBILINOGEN 1.0 12/30/2014 1830   NITRITE NEGATIVE 09/03/2017 2045   LEUKOCYTESUR NEGATIVE 09/03/2017 2045   Sepsis Labs: @LABRCNTIP (procalcitonin:4,lacticidven:4) )No results found for this or any previous visit (from the past 240 hour(s)).   Radiological Exams on Admission: Dg Chest 2 View  Result Date: 09/03/2017 CLINICAL DATA:  Generalized weakness.  Hypertension. EXAM: CHEST - 2 VIEW COMPARISON:  Jun 16, 2017 FINDINGS: There is a small pleural effusion on each side with bibasilar atelectatic change. There is no frank edema or consolidation. Heart is upper normal in size with pulmonary vascularity normal. No adenopathy. There is thoracolumbar levoscoliosis. IMPRESSION: Small pleural effusions with bibasilar atelectasis. No frank consolidation. Stable cardiac silhouette. Electronically Signed   By: Lowella Grip III M.D.   On: 09/03/2017 18:21   Ct Head Wo Contrast  Result Date: 09/03/2017 CLINICAL DATA:  67 year old male with altered mental status. EXAM: CT HEAD WITHOUT CONTRAST TECHNIQUE: Contiguous axial images were obtained from the base of the skull through the vertex without intravenous contrast. COMPARISON:  Head CT dated 06/16/2017 FINDINGS: Brain: There is mild age-related atrophy and chronic microvascular ischemic changes. There is no acute intracranial hemorrhage. No mass effect or midline shift noted. No extra-axial fluid collection. Vascular: No hyperdense vessel or unexpected calcification. Skull: Normal. Negative for fracture or focal lesion. Sinuses/Orbits: No acute finding. Other: None IMPRESSION: 1. No acute intracranial pathology. 2. Age-related atrophy and chronic microvascular ischemic changes. Electronically Signed   By: Anner Crete M.D.   On: 09/03/2017 21:50    EKG: Pending  Assessment/Plan  This is a 67 y.o. male with a history of COPD, HTN, HLD, CAD,  Atrial fib, bipolar disorder, seizure disorder, dermatomyositis/ RA, CVA  now being admitted with:  AMS, unclear etiology likely multifactorial - Admit inpatient, tele - Neurochecks - Check UDS, ammonia, EtOH    Acute respiratory failure with hypoxia, COPD without exacerbation - Nebulizers, O2 therapy and expectorants as needed.  - Continuous pulse oximetry - Consider pulmonary consult if not improving.  - Continue Symbicort  Acute kidney injury  - Repeat BMP in AM.  - Avoid nephrotoxic medications: hold ibuprofen - Bladder scan and place foley catheter if evidence of urinary retention  History of diastolic CHF, without exacerbation. With bilateral lower extremity edema - Continue Lasix - I/O, daily weight - Check vasc US bilateral lower extremity  History of bipolar depression - Hold sedative medications for now: Hold Xanax - Continue risperidone, depakote - Consider psych consult  History of dermatomyositis - Not treated - Continue Ditropan - PT eval  History of seizure disorder  - Continue Keppra - Seizure precautions  History of PAF, rate controlled - Continue amiodarone - Not on anticoagulation due to recurrent falls and seizures.  History of  hypertension - Continue amiodarone  History  of hyperlipidemia - Continue atorvastatin  History of GERD - Continue Protonix for Nexium  History of CAD - Continue aspirin   Admission status: Inpatient IV Fluids: HL Diet/Nutrition: NPO 2/2 AMS Consults called: PT, social work  DVT Px: Lovenox, SCDs and early ambulation. Code Status: Full Code  Disposition Plan: To be determined.   All the records are reviewed and case discussed with ED provider. Management plans discussed with the patient and/or family who express understanding and agree with plan of care.  Jerick Khachatryan D.O. on 09/03/2017 at 11:12 PM CC: Primary care physician; [facility], [facility]   09/03/2017, 11:12 PM

## 2017-09-04 ENCOUNTER — Inpatient Hospital Stay (HOSPITAL_COMMUNITY): Payer: Medicare Other

## 2017-09-04 DIAGNOSIS — R609 Edema, unspecified: Secondary | ICD-10-CM

## 2017-09-04 LAB — COMPREHENSIVE METABOLIC PANEL
ALK PHOS: 57 U/L (ref 38–126)
ALT: 7 U/L (ref 0–44)
AST: 12 U/L — ABNORMAL LOW (ref 15–41)
Albumin: 2.9 g/dL — ABNORMAL LOW (ref 3.5–5.0)
Anion gap: 8 (ref 5–15)
BUN: 14 mg/dL (ref 8–23)
CALCIUM: 8.7 mg/dL — AB (ref 8.9–10.3)
CO2: 29 mmol/L (ref 22–32)
Chloride: 104 mmol/L (ref 98–111)
Creatinine, Ser: 1.37 mg/dL — ABNORMAL HIGH (ref 0.61–1.24)
GFR calc Af Amer: 60 mL/min — ABNORMAL LOW (ref >60)
GFR calc non Af Amer: 51 mL/min — ABNORMAL LOW (ref >60)
Glucose, Bld: 78 mg/dL (ref 70–99)
Potassium: 4.2 mmol/L (ref 3.5–5.1)
SODIUM: 141 mmol/L (ref 135–145)
Total Bilirubin: 0.9 mg/dL (ref 0.3–1.2)
Total Protein: 6 g/dL — ABNORMAL LOW (ref 6.5–8.1)

## 2017-09-04 LAB — TROPONIN I
Troponin I: <0.03 ng/mL (ref <0.03)
Troponin I: <0.03 ng/mL (ref <0.03)

## 2017-09-04 LAB — BLOOD GAS, ARTERIAL
ACID-BASE EXCESS: 2.7 mmol/L — AB (ref 0.0–2.0)
BICARBONATE: 27.7 mmol/L (ref 20.0–28.0)
DRAWN BY: 441261
O2 Content: 2 L/min
O2 SAT: 91.5 %
PATIENT TEMPERATURE: 98.6
PO2 ART: 64.7 mmHg — AB (ref 83.0–108.0)
pCO2 arterial: 46.6 mmHg (ref 32.0–48.0)
pH, Arterial: 7.391 (ref 7.350–7.450)

## 2017-09-04 LAB — CBC
HEMATOCRIT: 40.2 % (ref 39.0–52.0)
Hemoglobin: 12.5 g/dL — ABNORMAL LOW (ref 13.0–17.0)
MCH: 26.7 pg (ref 26.0–34.0)
MCHC: 31.1 g/dL (ref 30.0–36.0)
MCV: 85.9 fL (ref 78.0–100.0)
Platelets: 242 10*3/uL (ref 150–400)
RBC: 4.68 MIL/uL (ref 4.22–5.81)
RDW: 19.6 % — ABNORMAL HIGH (ref 11.5–15.5)
WBC: 5.1 10*3/uL (ref 4.0–10.5)

## 2017-09-04 LAB — AMMONIA: AMMONIA: 35 umol/L (ref 9–35)

## 2017-09-04 LAB — URINALYSIS, ROUTINE W REFLEX MICROSCOPIC
Bacteria, UA: NONE SEEN
Bilirubin Urine: NEGATIVE
Glucose, UA: NEGATIVE mg/dL
Ketones, ur: 5 mg/dL — AB
Leukocytes, UA: NEGATIVE
Nitrite: NEGATIVE
Protein, ur: NEGATIVE mg/dL
Specific Gravity, Urine: 1.01 (ref 1.005–1.030)
pH: 7 (ref 5.0–8.0)

## 2017-09-04 LAB — RAPID URINE DRUG SCREEN, HOSP PERFORMED
Amphetamines: NOT DETECTED
Barbiturates: NOT DETECTED
Benzodiazepines: POSITIVE — AB
Cocaine: NOT DETECTED
Opiates: NOT DETECTED
Tetrahydrocannabinol: NOT DETECTED

## 2017-09-04 LAB — HIV ANTIBODY (ROUTINE TESTING W REFLEX): HIV Screen 4th Generation wRfx: NONREACTIVE

## 2017-09-04 LAB — TSH: TSH: 1.704 u[IU]/mL (ref 0.350–4.500)

## 2017-09-04 LAB — MAGNESIUM: Magnesium: 1.9 mg/dL (ref 1.7–2.4)

## 2017-09-04 LAB — PHOSPHORUS: Phosphorus: 3.7 mg/dL (ref 2.5–4.6)

## 2017-09-04 LAB — ETHANOL

## 2017-09-04 MED ORDER — IPRATROPIUM-ALBUTEROL 0.5-2.5 (3) MG/3ML IN SOLN: 3.0000 mL | Freq: Four times a day (QID) | RESPIRATORY_TRACT | Status: DC | PRN

## 2017-09-04 MED ORDER — AZTREONAM 1 G IJ SOLR
1.0000 g | Freq: Three times a day (TID) | INTRAMUSCULAR | Status: DC
  Administered 2017-09-04 – 2017-09-05 (×2): 1 g via INTRAVENOUS
  Filled 2017-09-04 (×3): qty 1

## 2017-09-04 MED ORDER — AMLODIPINE BESYLATE 5 MG PO TABS
5.0000 mg | ORAL_TABLET | Freq: Every day | ORAL | Status: DC
  Administered 2017-09-04 – 2017-09-07 (×3): 5 mg via ORAL
  Filled 2017-09-04 (×4): qty 1

## 2017-09-04 MED ORDER — ENOXAPARIN SODIUM 40 MG/0.4ML ~~LOC~~ SOLN
40.0000 mg | SUBCUTANEOUS | Status: DC
  Administered 2017-09-04 – 2017-09-07 (×4): 40 mg via SUBCUTANEOUS
  Filled 2017-09-04 (×4): qty 0.4

## 2017-09-04 MED ORDER — DIVALPROEX SODIUM 250 MG PO DR TAB
250.0000 mg | DELAYED_RELEASE_TABLET | Freq: Three times a day (TID) | ORAL | Status: DC
  Administered 2017-09-04 – 2017-09-07 (×11): 250 mg via ORAL
  Filled 2017-09-04 (×11): qty 1

## 2017-09-04 MED ORDER — TRAMADOL HCL 50 MG PO TABS
50.0000 mg | ORAL_TABLET | Freq: Four times a day (QID) | ORAL | Status: DC | PRN
  Administered 2017-09-04: 50 mg via ORAL
  Filled 2017-09-04: qty 1

## 2017-09-04 MED ORDER — BISACODYL 5 MG PO TBEC: 5.0000 mg | DELAYED_RELEASE_TABLET | Freq: Every day | ORAL | Status: DC | PRN

## 2017-09-04 MED ORDER — ATORVASTATIN CALCIUM 40 MG PO TABS
40.0000 mg | ORAL_TABLET | Freq: Every day | ORAL | Status: DC
  Administered 2017-09-04 – 2017-09-07 (×4): 40 mg via ORAL
  Filled 2017-09-04 (×4): qty 1

## 2017-09-04 MED ORDER — LOPERAMIDE HCL 2 MG PO CAPS: 2.0000 mg | ORAL_CAPSULE | ORAL | Status: DC | PRN

## 2017-09-04 MED ORDER — POTASSIUM CHLORIDE CRYS ER 20 MEQ PO TBCR: 20.0000 meq | EXTENDED_RELEASE_TABLET | ORAL | Status: DC

## 2017-09-04 MED ORDER — ADULT MULTIVITAMIN W/MINERALS CH
1.0000 | ORAL_TABLET | Freq: Every day | ORAL | Status: DC
  Administered 2017-09-04 – 2017-09-07 (×4): 1 via ORAL
  Filled 2017-09-04 (×4): qty 1

## 2017-09-04 MED ORDER — FUROSEMIDE 10 MG/ML IJ SOLN
60.0000 mg | Freq: Once | INTRAMUSCULAR | Status: AC
  Administered 2017-09-04: 60 mg via INTRAVENOUS
  Filled 2017-09-04: qty 6

## 2017-09-04 MED ORDER — POTASSIUM CHLORIDE CRYS ER 20 MEQ PO TBCR
20.0000 meq | EXTENDED_RELEASE_TABLET | Freq: Every day | ORAL | Status: DC
  Administered 2017-09-04 – 2017-09-07 (×4): 20 meq via ORAL
  Filled 2017-09-04 (×4): qty 1

## 2017-09-04 MED ORDER — SENNOSIDES-DOCUSATE SODIUM 8.6-50 MG PO TABS: 1.0000 | ORAL_TABLET | Freq: Every evening | ORAL | Status: DC | PRN

## 2017-09-04 MED ORDER — ONDANSETRON HCL 4 MG PO TABS
4.0000 mg | ORAL_TABLET | Freq: Four times a day (QID) | ORAL | Status: DC | PRN
Start: 2017-09-04 — End: 2017-09-07

## 2017-09-04 MED ORDER — AMIODARONE HCL 200 MG PO TABS
200.0000 mg | ORAL_TABLET | Freq: Every day | ORAL | Status: DC
  Administered 2017-09-04 – 2017-09-07 (×4): 200 mg via ORAL
  Filled 2017-09-04 (×4): qty 1

## 2017-09-04 MED ORDER — FLUTICASONE FUROATE-VILANTEROL 200-25 MCG/INH IN AEPB
1.0000 | INHALATION_SPRAY | Freq: Every day | RESPIRATORY_TRACT | Status: DC
  Administered 2017-09-05 – 2017-09-07 (×3): 1 via RESPIRATORY_TRACT
  Filled 2017-09-04: qty 28

## 2017-09-04 MED ORDER — MAGNESIUM CITRATE PO SOLN: 1.0000 | Freq: Once | ORAL | Status: DC | PRN

## 2017-09-04 MED ORDER — ONDANSETRON HCL 4 MG/2ML IJ SOLN: 4.0000 mg | Freq: Four times a day (QID) | INTRAMUSCULAR | Status: DC | PRN

## 2017-09-04 MED ORDER — OXYBUTYNIN CHLORIDE 5 MG PO TABS
5.0000 mg | ORAL_TABLET | Freq: Three times a day (TID) | ORAL | Status: DC
  Administered 2017-09-04 – 2017-09-07 (×11): 5 mg via ORAL
  Filled 2017-09-04 (×11): qty 1

## 2017-09-04 MED ORDER — ASPIRIN EC 81 MG PO TBEC
81.0000 mg | DELAYED_RELEASE_TABLET | Freq: Every day | ORAL | Status: DC
  Administered 2017-09-04 – 2017-09-07 (×4): 81 mg via ORAL
  Filled 2017-09-04 (×4): qty 1

## 2017-09-04 MED ORDER — PANTOPRAZOLE SODIUM 40 MG PO TBEC
40.0000 mg | DELAYED_RELEASE_TABLET | Freq: Every day | ORAL | Status: DC
Start: 2017-09-04 — End: 2017-09-07
  Administered 2017-09-04 – 2017-09-07 (×4): 40 mg via ORAL
  Filled 2017-09-04 (×4): qty 1

## 2017-09-04 MED ORDER — ACETAMINOPHEN 325 MG PO TABS: 650.0000 mg | ORAL_TABLET | Freq: Four times a day (QID) | ORAL | Status: DC | PRN

## 2017-09-04 MED ORDER — METHYLPREDNISOLONE SODIUM SUCC 125 MG IJ SOLR
125.0000 mg | Freq: Three times a day (TID) | INTRAMUSCULAR | Status: DC
  Administered 2017-09-04 – 2017-09-05 (×4): 125 mg via INTRAVENOUS
  Filled 2017-09-04 (×4): qty 2

## 2017-09-04 MED ORDER — ACETAMINOPHEN 650 MG RE SUPP: 650.0000 mg | Freq: Four times a day (QID) | RECTAL | Status: DC | PRN

## 2017-09-04 MED ORDER — LEVETIRACETAM 500 MG PO TABS
500.0000 mg | ORAL_TABLET | Freq: Two times a day (BID) | ORAL | Status: DC
  Administered 2017-09-04 – 2017-09-07 (×8): 500 mg via ORAL
  Filled 2017-09-04 (×8): qty 1

## 2017-09-04 MED ORDER — RISPERIDONE 1 MG PO TABS
1.0000 mg | ORAL_TABLET | Freq: Two times a day (BID) | ORAL | Status: DC
  Administered 2017-09-04 – 2017-09-07 (×8): 1 mg via ORAL
  Filled 2017-09-04 (×8): qty 1

## 2017-09-04 MED ORDER — DM-GUAIFENESIN ER 30-600 MG PO TB12
1.0000 | ORAL_TABLET | Freq: Two times a day (BID) | ORAL | Status: DC
  Administered 2017-09-04 – 2017-09-07 (×8): 1 via ORAL
  Filled 2017-09-04 (×8): qty 1

## 2017-09-04 MED ORDER — NYSTATIN 100000 UNIT/ML MT SUSP
5.0000 mL | Freq: Four times a day (QID) | OROMUCOSAL | Status: DC
  Administered 2017-09-04 – 2017-09-07 (×12): 500000 [IU] via ORAL
  Filled 2017-09-04 (×12): qty 5

## 2017-09-04 MED ORDER — FUROSEMIDE 20 MG PO TABS
20.0000 mg | ORAL_TABLET | Freq: Every day | ORAL | Status: DC
  Administered 2017-09-04 – 2017-09-07 (×4): 20 mg via ORAL
  Filled 2017-09-04 (×4): qty 1

## 2017-09-04 MED ORDER — AZTREONAM 1 G IJ SOLR
1.0000 g | Freq: Once | INTRAMUSCULAR | Status: AC
  Administered 2017-09-04: 1 g via INTRAVENOUS
  Filled 2017-09-04: qty 1

## 2017-09-04 NOTE — Progress Notes (Signed)
PT Cancellation Note  Patient Details Name: Patrick Holt MRN: [id number] DOB: 04-23-1950   Cancelled Treatment:    Reason Eval/Treat Not Completed: Medical issues which prohibited therapy(pt noted to have RLE DVT, lovenox given at 10:21. Per rehab protocol, mobility can be initiated 3 hours after administration of lovenox. Will follow. )   [name] 09/04/2017, 11:46 AM [phone]

## 2017-09-04 NOTE — Progress Notes (Signed)
Pharmacy Antibiotic Note  Patrick Holt is a 67 y.o. male admitted on 09/03/2017 with Muscular Dystrophy, c/o weakness, body aches, bilateral foot pain. Doppler + for RLE DVT. Pharmacy has been consulted for Aztreonam dosing.  Plan: Aztreonam 1gm q8hr Urine Cx in process  Height: 5\' 3"  (160 cm) Weight: 232 lb 4.8 oz (105.4 kg) IBW/kg (Calculated) : 56.9  Temp (24hrs), Avg:98.1 F (36.7 C), Min:98 F (36.7 C), Max:98.2 F (36.8 C)  Recent Labs  Lab 09/03/17 1901 09/04/17 0235 09/04/17 0626  WBC 5.2  --  5.1  CREATININE 1.36* 1.37*  --     Estimated Creatinine Clearance: 55.7 mL/min (A) (by C-G formula based on SCr of 1.37 mg/dL (H)).    Allergies  Allergen Reactions  . Immune Globulins Other (See Comments)    Acute renal failure  . Ciprofloxacin Swelling  . Flagyl [Metronidazole] Swelling  . Lisinopril Diarrhea  . Sulfa Antibiotics Other (See Comments)    blisters  . Cefepime     Diffuse maculopapular rash when receiving both vanc and cefepime, timing unclear.  . Morphine And Related     Does not help with pain  . Requip [Ropinirole Hcl]     constipation  . Vancomycin     Diffuse maculopapular rash when receiving both vanc/cefepime.  Timing unclear.    . Betamethasone Dipropionate Other (See Comments)    Unknown  . Bupropion Hcl Other (See Comments)    Unknown  . Clobetasol Other (See Comments)    Unknown  . Codeine Other (See Comments)    Does not help with pain  . Escitalopram Oxalate Other (See Comments)    Unknown  . Fluoxetine Hcl Other (See Comments)    Unknown  . Meclizine Rash  . Paroxetine Other (See Comments)    Unknown  . Penicillins Other (See Comments)    Unknown reaction Has patient had a PCN reaction causing immediate rash, facial/tongue/throat swelling, SOB or lightheadedness with hypotension: unknown Has patient had a PCN reaction causing severe rash involving mucus membranes or skin necrosis: unknown Has patient had a PCN reaction  that required hospitalization unknown Has patient had a PCN reaction occurring within the last 10 years: unknown If all of the above answers are "NO", then may proceed with Cephalosporin use. Unknown Unknown reactio  . Tacrolimus Other (See Comments)    Unknown  . Tetanus Toxoid Other (See Comments)    Unknown  . Tuberculin Purified Protein Derivative Other (See Comments)    Unknown   Antimicrobials this admission: 7/21 Aztreonam >>   Dose adjustments this admission:  Microbiology results: 7/20 UCx: sent   Thank you for allowing pharmacy to be a part of this patient's care.  Minda Ditto PharmD Pager [phone] 09/04/2017, 10:02 AM

## 2017-09-04 NOTE — Progress Notes (Signed)
PROGRESS NOTE                                                                                                                                                                                                             Patient Demographics:    Patrick Holt, is a 67 y.o. male, DOB - 06-02-1950, ONG:[id number]  Admit date - 09/03/2017   Admitting Physician Harvie Bridge, DO  Outpatient Primary MD for the patient is [facility]  LOS - 1   Chief Complaint  Patient presents with  . Generalized Body Aches       Brief Narrative   Patrick Holt is a 67 y.o. male with a known history of COPD, HTN, HLD, CAD,  Atrial fib, bipolar disorder, seizure disorder, dermatomyositis/ RA, CVA presents to the emergency department for evaluation of weakness, body aches.  Patient was in a usual state of health until several weeks ago when he reports the onset of cough, bilateral lower extremity scaling and edema.  He reports the onset of weakness over the past few days consistent with his muscular dystrophy flares     Subjective:    Patrick Holt today has, No headache, No chest pain, No abdominal pain , he reports generalized weakness   Assessment  & Plan :    Active Problems:   Altered mental status  Weakness -There is nonfocal, generalized, CT head with no acute findings, this most likely related to dermatomyositis flare, as he reported resembles his previous episodes in the past, will continue with IV cell Medrol 125 every 8 hours, he will be discharged on a prednisone taper once improved to follow with rheumatology as an outpatient. -PT consulted  COPD, chronic, hypoxia -Continue with home meds Symbicort, encouraged to use incentive spirometry  A. Fib -Maintaining normal sinus rhythm, on amiodarone, anticoagulation secondary to history to subdural hematoma and recurrent falls -To have sinus bradycardia on admission, and  pauses on Dmitriy 2.7 seconds earlier this morning, his primary cardiologist Dr. Harrington Challenger was available, she reviewed telemetry strip, reviewed the case, patient on not on anticoagulation, and these were symptomatic bradycardia/pauses, recommendation was to continue amiodarone at current dose to maintain normal sinus rhythm at the he is not a candidate for anticoagulation.  UTI -Given multiple allergies, started on his lactam  Acute DVT -Venous Doppler showing evidence  of acute DVT involving the intramuscular gastrocnemius vein in the right lower extremity, have discussed these findings with vascular surgeon on-call Dr. Vallarie Mare, this patient is high risk for anticoagulation given history of subdural hematoma, multiple falls, and this is small below-knee DVT, be managed with compression stockings, and repeat Doppler in 1 month attention of blood clots, to follow with Dr. Bridgett Larsson in 1 month as an outpatient.  AKI -Creatinine 1.3 on admission, baseline around 1, continue to hold morphine, continue to monitor  Chronic diastolic CHF -Appears to be euvolemic, continue with home dose Lasix  Esther bipolar -Continue with home meds  History of seizure disorders -Continue with home meds  Hyperlipidemia -Continue with statin  CAD -Continue with aspirin    Code Status : FULL  Family Communication  : NONE AT EBDSIDE  Disposition Plan  : pending PT consult  Consults  :  NONE  Procedures  : none  DVT Prophylaxis  :  lovenox  Lab Results  Component Value Date   PLT 242 09/04/2017    Antibiotics  :    Anti-infectives (From admission, onward)   Start     Dose/Rate Route Frequency Ordered Stop   09/04/17 2000  aztreonam (AZACTAM) 1 g in sodium chloride 0.9 % 100 mL IVPB     1 g 200 mL/hr over 30 Minutes Intravenous Every 8 hours 09/04/17 1007     09/04/17 1100  aztreonam (AZACTAM) 1 g in sodium chloride 0.9 % 100 mL IVPB     1 g 200 mL/hr over 30 Minutes Intravenous  Once 09/04/17 1007  09/04/17 1345        Objective:   Vitals:   09/04/17 0015 09/04/17 0030 09/04/17 0136 09/04/17 1358  BP:   118/80 134/86  Pulse: 68 65 61 81  Resp: 11 16 16 20   Temp:   98.2 F (36.8 C) 98 F (36.7 C)  TempSrc:   Oral Oral  SpO2: 94% 93% 91% 91%  Weight:   105.4 kg (232 lb 4.8 oz)   Height:   5\' 3"  (1.6 m)     Wt Readings from Last 3 Encounters:  09/04/17 105.4 kg (232 lb 4.8 oz)  06/16/17 86.2 kg (190 lb)  03/07/17 100 kg (220 lb 6.4 oz)     Intake/Output Summary (Last 24 hours) at 09/04/2017 1449 Last data filed at 09/04/2017 1300 Gross per 24 hour  Intake 240 ml  Output -  Net 240 ml     Physical Exam  Awake Alert, Oriented X 3, No new F.N deficits, Normal affect Symmetrical Chest wall movement, Good air movement bilaterally, CTAB RRR,No Gallops,Rubs or new Murmurs, No Parasternal Heave +ve B.Sounds, Abd Soft, No tenderness, No rebound - guarding or rigidity. No Cyanosis, Clubbing or edema, No new Rash or bruise ,, but nothing focal    Data Review:    CBC Recent Labs  Lab 09/03/17 1901 09/04/17 0626  WBC 5.2 5.1  HGB 11.9* 12.5*  HCT 38.0* 40.2  PLT 244 242  MCV 85.4 85.9  MCH 26.7 26.7  MCHC 31.3 31.1  RDW 19.6* 19.6*  LYMPHSABS 2.2  --   MONOABS 0.6  --   EOSABS 0.2  --   BASOSABS 0.0  --     Chemistries  Recent Labs  Lab 09/03/17 1901 09/04/17 0235  NA 142 141  K 4.7 4.2  CL 107 104  CO2 27 29  GLUCOSE 88 78  BUN 14 14  CREATININE 1.36* 1.37*  CALCIUM 8.7* 8.7*  MG  --  1.9  AST 18 12*  ALT 7 7  ALKPHOS 53 57  BILITOT 1.2 0.9   ------------------------------------------------------------------------------------------------------------------ No results for input(s): CHOL, HDL, LDLCALC, TRIG, CHOLHDL, LDLDIRECT in the last 72 hours.  Lab Results  Component Value Date   HGBA1C 5.9 (H) 01/14/2017   ------------------------------------------------------------------------------------------------------------------ Recent Labs      09/04/17 0235  TSH 1.704   ------------------------------------------------------------------------------------------------------------------ No results for input(s): VITAMINB12, FOLATE, FERRITIN, TIBC, IRON, RETICCTPCT in the last 72 hours.  Coagulation profile No results for input(s): INR, PROTIME in the last 168 hours.  No results for input(s): DDIMER in the last 72 hours.  Cardiac Enzymes Recent Labs  Lab 09/04/17 0235 09/04/17 0827 09/04/17 1310  TROPONINI <0.03 <0.03 <0.03   ------------------------------------------------------------------------------------------------------------------    Component Value Date/Time   BNP 36.1 09/03/2017 1901    Inpatient Medications  Scheduled Meds: . amiodarone  200 mg Oral Daily  . amLODipine  5 mg Oral Daily  . aspirin EC  81 mg Oral Daily  . atorvastatin  40 mg Oral Daily  . dextromethorphan-guaiFENesin  1 tablet Oral BID  . divalproex  250 mg Oral TID  . enoxaparin (LOVENOX) injection  40 mg Subcutaneous Q24H  . fluticasone furoate-vilanterol  1 puff Inhalation Daily  . furosemide  20 mg Oral Daily  . levETIRAcetam  500 mg Oral BID  . methylPREDNISolone (SOLU-MEDROL) injection  125 mg Intravenous Q8H  . multivitamin with minerals  1 tablet Oral Daily  . nystatin  5 mL Oral QID  . oxybutynin  5 mg Oral TID  . pantoprazole  40 mg Oral Daily  . potassium chloride SA  20 mEq Oral Daily  . risperiDONE  1 mg Oral BID   Continuous Infusions: . aztreonam     PRN Meds:.acetaminophen **OR** acetaminophen, bisacodyl, ipratropium-albuterol, loperamide, magnesium citrate, ondansetron **OR** ondansetron (ZOFRAN) IV, senna-docusate, traMADol  Micro Results No results found for this or any previous visit (from the past 240 hour(s)).  Radiology Reports Dg Chest 2 View  Result Date: 09/03/2017 CLINICAL DATA:  Generalized weakness.  Hypertension. EXAM: CHEST - 2 VIEW COMPARISON:  Jun 16, 2017 FINDINGS: There is a small pleural  effusion on each side with bibasilar atelectatic change. There is no frank edema or consolidation. Heart is upper normal in size with pulmonary vascularity normal. No adenopathy. There is thoracolumbar levoscoliosis. IMPRESSION: Small pleural effusions with bibasilar atelectasis. No frank consolidation. Stable cardiac silhouette. Electronically Signed   By: Lowella Grip III M.D.   On: 09/03/2017 18:21   Ct Head Wo Contrast  Result Date: 09/03/2017 CLINICAL DATA:  67 year old male with altered mental status. EXAM: CT HEAD WITHOUT CONTRAST TECHNIQUE: Contiguous axial images were obtained from the base of the skull through the vertex without intravenous contrast. COMPARISON:  Head CT dated 06/16/2017 FINDINGS: Brain: There is mild age-related atrophy and chronic microvascular ischemic changes. There is no acute intracranial hemorrhage. No mass effect or midline shift noted. No extra-axial fluid collection. Vascular: No hyperdense vessel or unexpected calcification. Skull: Normal. Negative for fracture or focal lesion. Sinuses/Orbits: No acute finding. Other: None IMPRESSION: 1. No acute intracranial pathology. 2. Age-related atrophy and chronic microvascular ischemic changes. Electronically Signed   By: Anner Crete M.D.   On: 09/03/2017 21:50     Phillips Climes M.D on 09/04/2017 at 2:49 PM  Between 7am to 7pm - Pager - [phone]  After 7pm go to [email or web address] - password [email or web address]  [facility] -  Office  [phone]

## 2017-09-04 NOTE — ED Notes (Signed)
ED TO INPATIENT HANDOFF REPORT  Name/Age/Gender Patrick Holt 67 y.o. male  Code Status Code Status History    Date Active Date Inactive Code Status Order ID Comments User Context   06/16/2017 0739 06/16/2017 1447 Full Code [id number]  Lady Deutscher, MD ED   03/03/2017 2057 03/07/2017 2125 Full Code [id number]  Norval Morton, MD ED   02/20/2017 2101 02/23/2017 2221 Full Code [id number]  [name], MD Inpatient   02/05/2017 1211 02/10/2017 2051 Full Code [id number]  Samella Parr, NP ED   01/14/2017 0111 01/19/2017 2033 Full Code [id number]  Jani Gravel, MD Inpatient   11/21/2016 0214 11/22/2016 1941 Full Code [id number]  Jani Gravel, MD Inpatient   11/14/2015 1527 11/18/2015 2332 Full Code [id number]  Murlean Iba, MD ED   11/03/2015 0019 11/05/2015 2257 Full Code [id number]  Nicolette Bang, DO Inpatient   12/22/2014 0344 12/22/2014 1827 Full Code [id number]  Virginia Crews, MD Inpatient   07/16/2014 2000 07/23/2014 2045 Full Code [id number]  Clovis Fredrickson, MD Inpatient   07/06/2014 1802 07/16/2014 1927 Full Code [id number]  Olam Idler, MD Inpatient   03/29/2014 0603 03/29/2014 2240 Full Code [id number]  Nobie Putnam, DO Inpatient   01/04/2014 0207 01/06/2014 1630 Full Code [id number]  Leeanne Rio, MD ED   08/01/2013 1431 08/03/2013 1514 Full Code [id number]  Coral Spikes, DO Inpatient   06/04/2013 0619 06/04/2013 2148 Full Code [id number]  Bernadene Bell, MD Inpatient   12/28/2012 1807 12/29/2012 1943 Full Code [id number]  Leeanne Rio, MD ED   11/09/2012 0916 11/16/2012 1823 Full Code [id number]  Montez Morita, MD Inpatient   10/16/2012 2057 10/20/2012 2139 Full Code [id number]  Ma Hillock, DO Inpatient   09/26/2012 1315 09/27/2012 1607 Full Code [id number]  Willia Craze, NP Inpatient   02/12/2012 2349 02/21/2012 2125 Full Code [id number]  Richarda Blade, MD ED    Advance Directive Documentation     Most Recent Value  Type of Advance  Directive  Living will  Pre-existing out of facility DNR order (yellow form or pink MOST form)  -  "MOST" Form in Place?  -      Home/SNF/Other Home  Chief Complaint Weakness  Level of Care/Admitting Diagnosis ED Disposition    ED Disposition Condition [place]: [place] [[id number]]  Level of Care: Telemetry [5]  Admit to tele based on following criteria: Monitor QTC interval  Diagnosis: Altered mental status [780.97.ICD-9-CM]  Admitting Physician: Harvie Bridge [[id number]]  Attending Physician: Harvie Bridge [[id number]]  Estimated length of stay: past midnight tomorrow  Certification:: I certify this patient will need inpatient services for at least 2 midnights  PT Class (Do Not Modify): Inpatient [101]  PT Acc Code (Do Not Modify): Private [1]       Medical History Past Medical History:  Diagnosis Date  . Anemia   . Anxiety   . Asthma   . Atrial fibrillation ([place]) 01/2012   with RVR  . Bipolar 1 disorder ([place])   . Cancer ([place])   . Collagen vascular disease ([place])   . Conversion disorder 06/2010  . COPD (chronic obstructive pulmonary disease) ([place])   . Dermatomyositis ([place])   . Dermatomyositis ([place])   . Diverticulitis   . Diverticulosis of colon   . Dysrhythmia    "irregular" (11/15/2012)  . Esophageal dysmotility   . Esophageal stricture   . Fibromyalgia   . Gastritis   .  GERD (gastroesophageal reflux disease)   . Hand fracture, right    sept 13, 2017, still wearing splint as og 01-10-2017  . Headache(784.0)    "severe; get them often" (11/15/2012)  . Hiatal hernia   . History of narcotic addiction ([place])   . Hx of adenomatous colonic polyps   . Hyperlipidemia   . Hypertension   . Internal hemorrhoids   . Ischemic heart disease   . Major depression    with acute psychotic break in 06/2010  . Myocardial infarct ([place])    mulitple (1999, 2000, 2004)  . Narcotic dependence ([place])   . Nephrolithiasis   .  Obesity   . OCD (obsessive compulsive disorder)   . Otosclerosis   . Paroxysmal A-fib ([place])   . Peripheral neuropathy   . Raynaud's disease   . Renal insufficiency   . Rheumatoid arthritis(714.0)   . Sarcoidosis   . Seizures ([place])   . Subarachnoid hemorrhage ([place]) 01/2012   with subdural  hematoma.   . Type II diabetes mellitus ([place])   . Urge incontinence   . Vertigo     Allergies Allergies  Allergen Reactions  . Immune Globulins Other (See Comments)    Acute renal failure  . Ciprofloxacin Swelling  . Flagyl [Metronidazole] Swelling  . Lisinopril Diarrhea  . Sulfa Antibiotics Other (See Comments)    blisters  . Cefepime     Diffuse maculopapular rash when receiving both vanc and cefepime, timing unclear.  . Morphine And Related     Does not help with pain  . Requip [Ropinirole Hcl]     constipation  . Vancomycin     Diffuse maculopapular rash when receiving both vanc/cefepime.  Timing unclear.    . Betamethasone Dipropionate Other (See Comments)    Unknown  . Bupropion Hcl Other (See Comments)    Unknown  . Clobetasol Other (See Comments)    Unknown  . Codeine Other (See Comments)    Does not help with pain  . Escitalopram Oxalate Other (See Comments)    Unknown  . Fluoxetine Hcl Other (See Comments)    Unknown  . Meclizine Rash  . Paroxetine Other (See Comments)    Unknown  . Penicillins Other (See Comments)    Unknown reaction Has patient had a PCN reaction causing immediate rash, facial/tongue/throat swelling, SOB or lightheadedness with hypotension: unknown Has patient had a PCN reaction causing severe rash involving mucus membranes or skin necrosis: unknown Has patient had a PCN reaction that required hospitalization unknown Has patient had a PCN reaction occurring within the last 10 years: unknown If all of the above answers are "NO", then may proceed with Cephalosporin use. Unknown Unknown reactio  . Tacrolimus Other (See Comments)    Unknown  .  Tetanus Toxoid Other (See Comments)    Unknown  . Tuberculin Purified Protein Derivative Other (See Comments)    Unknown    IV Location/Drains/Wounds Patient Lines/Drains/Airways Status   Active Line/Drains/Airways    None          Labs/Imaging Results for orders placed or performed during the hospital encounter of 09/03/17 (from the past 48 hour(s))  Comprehensive metabolic panel     Status: Abnormal   Collection Time: 09/03/17  7:01 PM  Result Value Ref Range   Sodium 142 135 - 145 mmol/L   Potassium 4.7 3.5 - 5.1 mmol/L   Chloride 107 98 - 111 mmol/L    Comment: Please note change in reference range.   CO2 27  22 - 32 mmol/L   Glucose, Bld 88 70 - 99 mg/dL    Comment: Please note change in reference range.   BUN 14 8 - 23 mg/dL    Comment: Please note change in reference range.   Creatinine, Ser 1.36 (H) 0.61 - 1.24 mg/dL   Calcium 8.7 (L) 8.9 - 10.3 mg/dL   Total Protein 5.7 (L) 6.5 - 8.1 g/dL   Albumin 2.9 (L) 3.5 - 5.0 g/dL   AST 18 15 - 41 U/L   ALT 7 0 - 44 U/L    Comment: Please note change in reference range.   Alkaline Phosphatase 53 38 - 126 U/L   Total Bilirubin 1.2 0.3 - 1.2 mg/dL   GFR calc non Af Amer 52 (L) >60 mL/min   GFR calc Af Amer >60 >60 mL/min    Comment: (NOTE) The eGFR has been calculated using the CKD EPI equation. This calculation has not been validated in all clinical situations. eGFR's persistently <60 mL/min signify possible Chronic Kidney Disease.    Anion gap 8 5 - 15    Comment: Performed at [facility], [place] [address], [place], [place] [address]  CK     Status: None   Collection Time: 09/03/17  7:01 PM  Result Value Ref Range   Total CK 75 49 - 397 U/L    Comment: Performed at [facility], [place] [address] [place], [place] [address]  Brain natriuretic peptide     Status: None   Collection Time: 09/03/17  7:01 PM  Result Value Ref Range   B Natriuretic Peptide 36.1 0.0 - 100.0 pg/mL     Comment: Performed at [facility], [place] [address], [place], [place] [address]  CBC with Differential/Platelet     Status: Abnormal   Collection Time: 09/03/17  7:01 PM  Result Value Ref Range   WBC 5.2 4.0 - 10.5 K/uL   RBC 4.45 4.22 - 5.81 MIL/uL   Hemoglobin 11.9 (L) 13.0 - 17.0 g/dL   HCT 38.0 (L) 39.0 - 52.0 %   MCV 85.4 78.0 - 100.0 fL   MCH 26.7 26.0 - 34.0 pg   MCHC 31.3 30.0 - 36.0 g/dL   RDW 19.6 (H) 11.5 - 15.5 %   Platelets 244 150 - 400 K/uL   Neutrophils Relative % 43 %   Neutro Abs 2.3 1.7 - 7.7 K/uL   Lymphocytes Relative 42 %   Lymphs Abs 2.2 0.7 - 4.0 K/uL   Monocytes Relative 12 %   Monocytes Absolute 0.6 0.1 - 1.0 K/uL   Eosinophils Relative 3 %   Eosinophils Absolute 0.2 0.0 - 0.7 K/uL   Basophils Relative 0 %   Basophils Absolute 0.0 0.0 - 0.1 K/uL    Comment: Performed at [facility], [place] [address] [place], [place] [address]  Urinalysis, Routine w reflex microscopic     Status: Abnormal   Collection Time: 09/03/17  8:45 PM  Result Value Ref Range   Color, Urine YELLOW YELLOW   APPearance CLEAR CLEAR   Specific Gravity, Urine 1.017 1.005 - 1.030   pH 6.0 5.0 - 8.0   Glucose, UA NEGATIVE NEGATIVE mg/dL   Hgb urine dipstick SMALL (A) NEGATIVE   Bilirubin Urine NEGATIVE NEGATIVE   Ketones, ur NEGATIVE NEGATIVE mg/dL   Protein, ur NEGATIVE NEGATIVE mg/dL   Nitrite NEGATIVE NEGATIVE   Leukocytes, UA NEGATIVE NEGATIVE   RBC / HPF 11-20 0 - 5 RBC/hpf  WBC, UA 6-10 0 - 5 WBC/hpf   Bacteria, UA NONE SEEN NONE SEEN   Squamous Epithelial / LPF 0-5 0 - 5   Mucus PRESENT     Comment: Performed at [facility], [place] [address], [place], [place] [address]  Blood gas, arterial ([facility] & [facility] ONLY)     Status: Abnormal   Collection Time: 09/03/17 10:40 PM  Result Value Ref Range   O2 Content 2.0 L/min   Delivery systems NASAL CANNULA    pH, Arterial 7.392 7.350 - 7.450   pCO2 arterial 48.8 (H) 32.0 - 48.0  mmHg   pO2, Arterial 60.3 (L) 83.0 - 108.0 mmHg   Bicarbonate 29.1 (H) 20.0 - 28.0 mmol/L   Acid-Base Excess 3.8 (H) 0.0 - 2.0 mmol/L   O2 Saturation 89.3 %   Patient temperature 98.2    Collection site RIGHT RADIAL    Drawn by [id number]    Sample type ARTERIAL    Allens test (pass/fail) PASS PASS    Comment: Performed at [facility], [place] [address], [place], [place] [address]  Urine rapid drug screen (hosp performed)     Status: Abnormal   Collection Time: 09/03/17 11:35 PM  Result Value Ref Range   Opiates NONE DETECTED NONE DETECTED   Cocaine NONE DETECTED NONE DETECTED   Benzodiazepines POSITIVE (A) NONE DETECTED   Amphetamines NONE DETECTED NONE DETECTED   Tetrahydrocannabinol NONE DETECTED NONE DETECTED   Barbiturates NONE DETECTED NONE DETECTED    Comment: (NOTE) DRUG SCREEN FOR MEDICAL PURPOSES ONLY.  IF CONFIRMATION IS NEEDED FOR ANY PURPOSE, NOTIFY LAB WITHIN 5 DAYS. LOWEST DETECTABLE LIMITS FOR URINE DRUG SCREEN Drug Class                     Cutoff (ng/mL) Amphetamine and metabolites    1000 Barbiturate and metabolites    200 Benzodiazepine                 323 Tricyclics and metabolites     300 Opiates and metabolites        300 Cocaine and metabolites        300 THC                            50 Performed at [facility], [place] [address] [place], [place] [address]    Dg Chest 2 View  Result Date: 09/03/2017 CLINICAL DATA:  Generalized weakness.  Hypertension. EXAM: CHEST - 2 VIEW COMPARISON:  Jun 16, 2017 FINDINGS: There is a small pleural effusion on each side with bibasilar atelectatic change. There is no frank edema or consolidation. Heart is upper normal in size with pulmonary vascularity normal. No adenopathy. There is thoracolumbar levoscoliosis. IMPRESSION: Small pleural effusions with bibasilar atelectasis. No frank consolidation. Stable cardiac silhouette. Electronically Signed   By: Lowella Grip III M.D.   On:  09/03/2017 18:21   Ct Head Wo Contrast  Result Date: 09/03/2017 CLINICAL DATA:  67 year old male with altered mental status. EXAM: CT HEAD WITHOUT CONTRAST TECHNIQUE: Contiguous axial images were obtained from the base of the skull through the vertex without intravenous contrast. COMPARISON:  Head CT dated 06/16/2017 FINDINGS: Brain: There is mild age-related atrophy and chronic microvascular ischemic changes. There is no acute intracranial hemorrhage. No mass effect or midline shift noted. No extra-axial fluid collection. Vascular: No hyperdense vessel or unexpected calcification. Skull: Normal. Negative for fracture or focal  lesion. Sinuses/Orbits: No acute finding. Other: None IMPRESSION: 1. No acute intracranial pathology. 2. Age-related atrophy and chronic microvascular ischemic changes. Electronically Signed   By: Anner Crete M.D.   On: 09/03/2017 21:50    Pending Labs Unresulted Labs (From admission, onward)   Start     Ordered   09/03/17 2313  Ethanol  STAT,   R     09/03/17 2312   09/03/17 2305  Ammonia  Add-on,   R     09/03/17 2304   09/03/17 1803  Urine culture  STAT,   STAT     09/03/17 1802   Signed and Held  CBC  (enoxaparin (LOVENOX)    CrCl >/= 30 ml/min)  Once,   R    Comments:  Baseline for enoxaparin therapy IF NOT ALREADY DRAWN.  Notify MD if PLT < 100 K.    Signed and Held   Signed and Held  Creatinine, serum  (enoxaparin (LOVENOX)    CrCl >/= 30 ml/min)  Once,   R    Comments:  Baseline for enoxaparin therapy IF NOT ALREADY DRAWN.    Signed and Held   Signed and Held  Creatinine, serum  (enoxaparin (LOVENOX)    CrCl >/= 30 ml/min)  Weekly,   R    Comments:  while on enoxaparin therapy    Signed and Held   Signed and Held  HIV antibody (Routine Testing)  Once,   R     Signed and Held   Signed and Held  Magnesium  Add-on,   R     Signed and Held   Signed and Held  Phosphorus  Add-on,   R     Signed and Held   Signed and Held  CBC  Tomorrow morning,   R      Signed and Held   Signed and Held  TSH  Once,   R     Signed and Held   Signed and Held  Troponin I  Now then every 6 hours,   R     Signed and Held   Signed and Held  Urinalysis, Routine w reflex microscopic  Once,   R     Signed and Held   Signed and Held  Comprehensive metabolic panel  Tomorrow morning,   R     Signed and Held   Signed and Held  Troponin I (q 6hr x 3)  Now then every 6 hours,   R     Signed and Held      Vitals/Pain Today's Vitals   09/03/17 2345 09/04/17 0000 09/04/17 0015 09/04/17 0030  BP:  128/72    Pulse: (!) 54 66 68 65  Resp: '14 13 11 16  ' Temp:      TempSrc:      SpO2: 91% 93% 94% 93%  PainSc:        Isolation Precautions No active isolations  Medications Medications - No data to display  Mobility non-ambulatory

## 2017-09-04 NOTE — Progress Notes (Addendum)
Bilateral lower extremity venous duplex has been completed. There is evidence of acute deep vein thrombosis involving the intramuscular gastrocnemius vein of the right lower extremity. Negative for obvious evidence of DVT on the left. Results were given to the patient's nurse, Claiborne Billings.  09/04/17 9:04 AM Patrick Holt RVT

## 2017-09-05 LAB — BASIC METABOLIC PANEL
Anion gap: 11 (ref 5–15)
BUN: 23 mg/dL (ref 8–23)
CHLORIDE: 100 mmol/L (ref 98–111)
CO2: 28 mmol/L (ref 22–32)
CREATININE: 1.44 mg/dL — AB (ref 0.61–1.24)
Calcium: 9 mg/dL (ref 8.9–10.3)
GFR calc Af Amer: 56 mL/min — ABNORMAL LOW (ref >60)
GFR calc non Af Amer: 48 mL/min — ABNORMAL LOW (ref >60)
Glucose, Bld: 150 mg/dL — ABNORMAL HIGH (ref 70–99)
Potassium: 3.7 mmol/L (ref 3.5–5.1)
SODIUM: 139 mmol/L (ref 135–145)

## 2017-09-05 LAB — CBC
HCT: 42.2 % (ref 39.0–52.0)
HEMOGLOBIN: 13.7 g/dL (ref 13.0–17.0)
MCH: 27.3 pg (ref 26.0–34.0)
MCHC: 32.5 g/dL (ref 30.0–36.0)
MCV: 84.2 fL (ref 78.0–100.0)
Platelets: 298 10*3/uL (ref 150–400)
RBC: 5.01 MIL/uL (ref 4.22–5.81)
RDW: 18.6 % — ABNORMAL HIGH (ref 11.5–15.5)
WBC: 11.3 10*3/uL — ABNORMAL HIGH (ref 4.0–10.5)

## 2017-09-05 LAB — URINE CULTURE

## 2017-09-05 MED ORDER — PREDNISONE 20 MG PO TABS
40.0000 mg | ORAL_TABLET | Freq: Every day | ORAL | Status: DC
  Administered 2017-09-05 – 2017-09-07 (×3): 40 mg via ORAL
  Filled 2017-09-05 (×3): qty 2

## 2017-09-05 MED ORDER — CEFTRIAXONE SODIUM 1 G IJ SOLR
1.0000 g | INTRAMUSCULAR | Status: DC
  Administered 2017-09-05 – 2017-09-06 (×2): 1 g via INTRAVENOUS
  Filled 2017-09-05: qty 10
  Filled 2017-09-05 (×2): qty 1

## 2017-09-05 NOTE — Clinical Social Work Note (Signed)
Clinical Social Work Assessment  Patient Details  Name: Patrick Holt MRN: [id number] Date of Birth: Jun 10, 1950  Date of referral:  09/05/17               Reason for consult:  Facility Placement                Permission sought to share information with:  Case Manager, Other Permission granted to share information::  Yes, Verbal Permission Granted  Name::     [name]::  [place] care  Relationship::     Contact Information:     Housing/Transportation Living arrangements for the past 2 months:  Apartment Source of Information:  Patient Patient Interpreter Needed:  None Criminal Activity/Legal Involvement Pertinent to Current Situation/Hospitalization:  No - Comment as needed Significant Relationships:  Warehouse manager Lives with:  Self Do you feel safe going back to the place where you live?  Yes Need for family participation in patient care:  No (Coment)  Care giving concerns:  Patient lives alone. Patient reports that he does not have any support outside of home health aides. PT recommending SNF patient declines SNF.    Social Worker assessment / plan:  LCSW consulted for SNF placement.  Patient admitted for generalized body aches.   LCSW met with patient at bedside. No supports present. Patient reports he was unable to get up and that is what sent him to the hospital. Patient reports that he uses a walker and motorized scooter at baseline. Patient states he is able to transfer with the assistance of his walker. Patient states that he is able to bathe and dress himself. He states bathing is difficult and he bathes in the sink after multiple falls in the shower. Patient reports that he has home health aides Mon and Wed from 12-3 and Fri from 12-1. Patient reports that he can have his time increased. PAtietn reports that he has attempted to get in to ALF in the past, but he makes too much money. Patient has a case Freight forwarder at [facility], [place], but is not sure how  to get in touch with her.   LCSW discussed PT recommendation for SNF. Patient declines SNF. Patient states he was previously in SNF for rehab and did not find it beneficial and states he was treated poorly. Patient request to go home with home health. LCSW discussed safety concerns with returning home prior to rehab. Patient feels he will get better care at home with home health. LCSW notified MD and RNCM of patients request. Patient asked LCSW to contact his Anderson Malta [phone] from his home health agency to ask about increasing time of aides.   PLAN: Patient to go home with Home Health PT.     Employment status:  Disabled (Comment on whether or not currently receiving Disability) Insurance information:  Managed Medicare PT Recommendations:  [place] / Referral to community resources:  [place]  Patient/Family's Response to care:  Patient apologized to LCSW for being "difficult" patient is appreciative of dc planning.   Patient/Family's Understanding of and Emotional Response to Diagnosis, Current Treatment, and Prognosis:  Patient is not realistic about his current condition. LCSW discussed safety concerns. Patient is certain he can manage at home. Patient states he has no friends or family support and that all his family has passed on.   Emotional Assessment Appearance:  Appears stated age Attitude/Demeanor/Rapport:    Affect (typically observed):  Calm Orientation:  Oriented to Self, Oriented  to Place, Oriented to  Time, Oriented to Situation Alcohol / Substance use:  Not Applicable Psych involvement (Current and /or in the community):  No (Comment)  Discharge Needs  Concerns to be addressed:  Lack of Support Readmission within the last 30 days:    Current discharge risk:  Lives alone Barriers to Discharge:  Continued Medical Work up   [employer], LCSW 09/05/2017, 3:05 PM

## 2017-09-05 NOTE — Evaluation (Addendum)
Physical Therapy Evaluation Patient Details Name: Patrick Holt MRN: [id number] DOB: 03-02-1950 Today's Date: 09/05/2017   History of Present Illness   67 y.o. male with a known history of COPD, HTN, HLD, CAD,  Atrial fib, bipolar disorder, seizure disorder, dermatomyositis/ RA, CVA presents to the emergency department for evaluation of weakness, body aches.   Clinical Impression  Pt admitted with above diagnosis. Pt currently with functional limitations due to the deficits listed below (see PT Problem List). Mod assist for bed mobility, min assist to ambulate 6' with RW, distance limited by B foot pain. Pt reports he lives alone, has an aide that comes for 1-2 hours/3x week, and that he was unable to get out of bed or take his medication for 2 days prior to admission. Due to difficulty caring for himself, h/o 5 falls in past year, and lack of 45* assistance (he stated family is all deceased),  he would benefit from higher level of care.  Pt will benefit from skilled PT to increase their independence and safety with mobility to allow discharge to the venue listed below.       Follow Up Recommendations SNF;Supervision/Assistance - 24 hour;Supervision for mobility/OOB    Equipment Recommendations  None recommended by PT    Recommendations for Other Services       Precautions / Restrictions Precautions Precautions: Fall Precaution Comments: mulitple falls in past year; most recent 1 month ago Restrictions Weight Bearing Restrictions: No      Mobility  Bed Mobility Overal bed mobility: Needs Assistance Bed Mobility: Supine to Sit;Sit to Supine     Supine to sit: Mod assist Sit to supine: Mod assist   General bed mobility comments: assist to raise trunk and advance BLEs; assist to bring LEs into bed  Transfers Overall transfer level: Needs assistance Equipment used: Rolling walker (2 wheeled) Transfers: Sit to/from Stand Sit to Stand: Min assist;From elevated surface          General transfer comment: min A to rise from elevated surface  Ambulation/Gait Ambulation/Gait assistance: Min guard Gait Distance (Feet): 6 Feet Assistive device: Rolling walker (2 wheeled) Gait Pattern/deviations: Step-to pattern Gait velocity: decr   General Gait Details: walked forward 2' and backward 2', sidestepped at edge of bed ~2'; distance limited by B foot pain  Stairs            Wheelchair Mobility    Modified Rankin (Stroke Patients Only)       Balance Overall balance assessment: Needs assistance;History of Falls Sitting-balance support: Feet supported Sitting balance-Leahy Scale: Fair     Standing balance support: Bilateral upper extremity supported Standing balance-Leahy Scale: Poor Standing balance comment: relies on BUE support                             Pertinent Vitals/Pain Pain Assessment: 0-10 Pain Score: 6  Pain Location: B feet; R lateral ankle, L 1st and 2nd toes Pain Descriptors / Indicators: Grimacing Pain Intervention(s): Limited activity within patient's tolerance;Monitored during session;Repositioned    Home Living Family/patient expects to be discharged to:: Private residence Living Arrangements: Alone Available Help at Discharge: Personal care attendant Type of Home: Apartment Home Access: [place]: One level Home Equipment: Environmental consultant - 2 wheels;Cane - single point;Electric scooter Additional Comments: Ship broker    Prior Function Level of Independence: Needs Water engineer / Transfers Assistance Needed: amb short distances with RW  and uses scooter for longer distances--was unable to get out of bed for 2 days prior to admission  ADL's / Homemaking Assistance Needed: aide assists with bathing/dressing; sponge bathes  Comments: aide comes 3x/week     Hand Dominance        Extremity/Trunk Assessment   Upper Extremity Assessment Upper Extremity Assessment: Defer to OT  evaluation(RUE tremor noted when feeding self)    Lower Extremity Assessment Lower Extremity Assessment: Generalized weakness(B knee ext -4/5, sensation intact to light touch)    Cervical / Trunk Assessment Cervical / Trunk Assessment: Normal  Communication   Communication: No difficulties  Cognition Arousal/Alertness: Awake/alert Behavior During Therapy: WFL for tasks assessed/performed Overall Cognitive Status: Within Functional Limits for tasks assessed                                        General Comments      Exercises     Assessment/Plan    PT Assessment Patient needs continued PT services  PT Problem List Decreased activity tolerance;Decreased strength;Decreased balance;Decreased mobility;Pain       PT Treatment Interventions DME instruction;Gait training;Therapeutic exercise;Therapeutic activities;Functional mobility training;Patient/family education;Balance training    PT Goals (Current goals can be found in the Care Plan section)  Acute Rehab PT Goals Patient Stated Goal: to get more help PT Goal Formulation: With patient Time For Goal Achievement: 09/19/17 Potential to Achieve Goals: Good    Frequency Min 2X/week   Barriers to discharge        Co-evaluation               AM-PAC PT "6 Clicks" Daily Activity  Outcome Measure Difficulty turning over in bed (including adjusting bedclothes, sheets and blankets)?: A Lot Difficulty moving from lying on back to sitting on the side of the bed? : Unable Difficulty sitting down on and standing up from a chair with arms (e.g., wheelchair, bedside commode, etc,.)?: Unable Help needed moving to and from a bed to chair (including a wheelchair)?: A Little Help needed walking in hospital room?: A Lot Help needed climbing 3-5 steps with a railing? : Total 6 Click Score: 10    End of Session Equipment Utilized During Treatment: Gait belt Activity Tolerance: Patient limited by fatigue;Patient  limited by pain Patient left: in bed;with call bell/phone within reach;with bed alarm set Nurse Communication: Mobility status PT Visit Diagnosis: History of falling (Z91.81);Repeated falls (R29.6);Difficulty in walking, not elsewhere classified (R26.2);Muscle weakness (generalized) (M62.81);Adult, failure to thrive (R62.7)    Time: 1150-1225 PT Time Calculation (min) (ACUTE ONLY): 35 min   Charges:   PT Evaluation $PT Eval Moderate Complexity: 1 Mod PT Treatments $Therapeutic Activity: 8-22 mins   PT G Codes:         [name] 09/05/2017, 12:38 PM [phone]

## 2017-09-05 NOTE — Progress Notes (Signed)
PROGRESS NOTE                                                                                                                                                                                                             Patient Demographics:    Patrick Holt, is a 67 y.o. male, DOB - July 27, 1950, ZDG:[id number]  Admit date - 09/03/2017   Admitting Physician Cristy Folks, MD  Outpatient Primary MD for the patient is [facility]  LOS - 2   Chief Complaint  Patient presents with  . Generalized Body Aches       Brief Narrative   Patrick Holt is a 67 y.o. male with a known history of COPD, HTN, HLD, CAD,  Atrial fib, bipolar disorder, seizure disorder, dermatomyositis/ RA, CVA presents to the emergency department for evaluation of weakness, body aches.  Patient was in a usual state of health until several weeks ago when he reports the onset of cough, bilateral lower extremity scaling and edema.  He reports the onset of weakness over the past few days consistent with his muscular dystrophy flares    Subjective:    Patrick Holt today has, No headache, No chest pain, No abdominal pain , he reports weakness is improving  Assessment  & Plan :    Active Problems:   Altered mental status  Normalized weakness -It is nonfocal, generalized, CT head with no acute findings, possibly related to dermatomyositis flare, or UTI, she was started on IV Solu-Medrol, transition to oral prednisone, with recommendation for outpatient rheumatology follow-up. -PT consulted, recommendation for SNF placement, social worker to see  UTI -Initially on Azactam given multiple allergies, currently on Rocephin, urine cultures unhelpful as growing multiple species  COPD, chronic, hypoxia -Continue with home meds Symbicort, encouraged to use incentive spirometry  A. Fib -Maintaining normal sinus rhythm, on amiodarone, anticoagulation secondary to  history to subdural hematoma and recurrent falls -To have sinus bradycardia on admission, and pauses on Dmitriy 2.7 seconds earlier this morning, his primary cardiologist Dr. Harrington Challenger was available, she reviewed telemetry strip, reviewed the case, patient on not on anticoagulation, and these were symptomatic bradycardia/pauses, recommendation was to continue amiodarone at current dose to maintain normal sinus rhythm at the he is not a candidate for anticoagulation.  Acute DVT -Venous Doppler showing evidence of acute DVT  involving the intramuscular gastrocnemius vein in the right lower extremity, have discussed these findings with vascular surgeon on-call Dr. Vallarie Mare, this patient is high risk for anticoagulation given history of subdural hematoma, multiple falls, and this is small below-knee DVT, be managed with compression stockings, and repeat Doppler in 1 month attention of blood clots, to follow with Dr. Bridgett Larsson in 1 month as an outpatient.  AKI -Creatinine 1.3 on admission, baseline around 1, received 60 mg of IV Lasix yesterday, creatinine is up to 1.4 today, monitor.  Chronic diastolic CHF -Appears to be euvolemic, continue with home dose Lasix  Esther bipolar -Continue with home meds  History of seizure disorders -Continue with home meds  Hyperlipidemia -Continue with statin  CAD -Continue with aspirin    Code Status : FULL  Family Communication  : NONE AT EBDSIDE  Disposition Plan  : Seen by PT, will need SNF placement  Consults  :  NONE  Procedures  : none  DVT Prophylaxis  :  lovenox  Lab Results  Component Value Date   PLT 298 09/05/2017    Antibiotics  :    Anti-infectives (From admission, onward)   Start     Dose/Rate Route Frequency Ordered Stop   09/05/17 1200  cefTRIAXone (ROCEPHIN) 1 g in sodium chloride 0.9 % 100 mL IVPB     1 g 200 mL/hr over 30 Minutes Intravenous Every 24 hours 09/05/17 1032     09/04/17 2000  aztreonam (AZACTAM) 1 g in sodium chloride  0.9 % 100 mL IVPB  Status:  Discontinued     1 g 200 mL/hr over 30 Minutes Intravenous Every 8 hours 09/04/17 1007 09/05/17 1031   09/04/17 1100  aztreonam (AZACTAM) 1 g in sodium chloride 0.9 % 100 mL IVPB     1 g 200 mL/hr over 30 Minutes Intravenous  Once 09/04/17 1007 09/04/17 1345        Objective:   Vitals:   09/04/17 1358 09/04/17 2023 09/05/17 0300 09/05/17 0519  BP: 134/86 109/68  104/70  Pulse: 81 71  (!) 101  Resp: 20 20  20   Temp: 98 F (36.7 C) 98.1 F (36.7 C)  98.9 F (37.2 C)  TempSrc: Oral     SpO2: 91% 93%  (!) 89%  Weight:   104.8 kg (231 lb 0.7 oz)   Height:        Wt Readings from Last 3 Encounters:  09/05/17 104.8 kg (231 lb 0.7 oz)  06/16/17 86.2 kg (190 lb)  03/07/17 100 kg (220 lb 6.4 oz)     Intake/Output Summary (Last 24 hours) at 09/05/2017 1403 Last data filed at 09/05/2017 1251 Gross per 24 hour  Intake 1260 ml  Output 1750 ml  Net -490 ml     Physical Exam  Awake Alert, Oriented X 3, No new F.N deficits, Normal affect Symmetrical Chest wall movement, Good air movement bilaterally, CTAB RRR,No Gallops,Rubs or new Murmurs, No Parasternal Heave +ve B.Sounds, Abd Soft, No tenderness, No rebound - guarding or rigidity. No Cyanosis, Clubbing or edema, does have generalized weakness,     Data Review:    CBC Recent Labs  Lab 09/03/17 1901 09/04/17 0626 09/05/17 0802  WBC 5.2 5.1 11.3*  HGB 11.9* 12.5* 13.7  HCT 38.0* 40.2 42.2  PLT 244 242 298  MCV 85.4 85.9 84.2  MCH 26.7 26.7 27.3  MCHC 31.3 31.1 32.5  RDW 19.6* 19.6* 18.6*  LYMPHSABS 2.2  --   --   MONOABS 0.6  --   --  EOSABS 0.2  --   --   BASOSABS 0.0  --   --     Chemistries  Recent Labs  Lab 09/03/17 1901 09/04/17 0235 09/05/17 0802  NA 142 141 139  K 4.7 4.2 3.7  CL 107 104 100  CO2 27 29 28   GLUCOSE 88 78 150*  BUN 14 14 23   CREATININE 1.36* 1.37* 1.44*  CALCIUM 8.7* 8.7* 9.0  MG  --  1.9  --   AST 18 12*  --   ALT 7 7  --   ALKPHOS 53 57  --     BILITOT 1.2 0.9  --    ------------------------------------------------------------------------------------------------------------------ No results for input(s): CHOL, HDL, LDLCALC, TRIG, CHOLHDL, LDLDIRECT in the last 72 hours.  Lab Results  Component Value Date   HGBA1C 5.9 (H) 01/14/2017   ------------------------------------------------------------------------------------------------------------------ Recent Labs    09/04/17 0235  TSH 1.704   ------------------------------------------------------------------------------------------------------------------ No results for input(s): VITAMINB12, FOLATE, FERRITIN, TIBC, IRON, RETICCTPCT in the last 72 hours.  Coagulation profile No results for input(s): INR, PROTIME in the last 168 hours.  No results for input(s): DDIMER in the last 72 hours.  Cardiac Enzymes Recent Labs  Lab 09/04/17 0235 09/04/17 0827 09/04/17 1310  TROPONINI <0.03 <0.03 <0.03   ------------------------------------------------------------------------------------------------------------------    Component Value Date/Time   BNP 36.1 09/03/2017 1901    Inpatient Medications  Scheduled Meds: . amiodarone  200 mg Oral Daily  . amLODipine  5 mg Oral Daily  . aspirin EC  81 mg Oral Daily  . atorvastatin  40 mg Oral Daily  . dextromethorphan-guaiFENesin  1 tablet Oral BID  . divalproex  250 mg Oral TID  . enoxaparin (LOVENOX) injection  40 mg Subcutaneous Q24H  . fluticasone furoate-vilanterol  1 puff Inhalation Daily  . furosemide  20 mg Oral Daily  . levETIRAcetam  500 mg Oral BID  . multivitamin with minerals  1 tablet Oral Daily  . nystatin  5 mL Oral QID  . oxybutynin  5 mg Oral TID  . pantoprazole  40 mg Oral Daily  . potassium chloride SA  20 mEq Oral Daily  . predniSONE  40 mg Oral Q breakfast  . risperiDONE  1 mg Oral BID   Continuous Infusions: . cefTRIAXone (ROCEPHIN)  IV Stopped (09/05/17 1316)   PRN Meds:.acetaminophen **OR**  acetaminophen, bisacodyl, ipratropium-albuterol, loperamide, magnesium citrate, ondansetron **OR** ondansetron (ZOFRAN) IV, senna-docusate, traMADol  Micro Results Recent Results (from the past 240 hour(s))  Urine culture     Status: Abnormal   Collection Time: 09/03/17  8:45 PM  Result Value Ref Range Status   Specimen Description   Final    URINE, CLEAN CATCH Performed at [facility], [place] [address] [place], [place] [address]    Special Requests   Final    NONE Performed at [facility], [place] [address], [place], [place] [address]    Culture MULTIPLE SPECIES PRESENT, SUGGEST RECOLLECTION (A)  Final   Report Status 09/05/2017 FINAL  Final    Radiology Reports Dg Chest 2 View  Result Date: 09/03/2017 CLINICAL DATA:  Generalized weakness.  Hypertension. EXAM: CHEST - 2 VIEW COMPARISON:  Jun 16, 2017 FINDINGS: There is a small pleural effusion on each side with bibasilar atelectatic change. There is no frank edema or consolidation. Heart is upper normal in size with pulmonary vascularity normal. No adenopathy. There is thoracolumbar levoscoliosis. IMPRESSION: Small pleural effusions with bibasilar atelectasis. No frank consolidation. Stable cardiac silhouette. Electronically Signed  By: Lowella Grip III M.D.   On: 09/03/2017 18:21   Ct Head Wo Contrast  Result Date: 09/03/2017 CLINICAL DATA:  67 year old male with altered mental status. EXAM: CT HEAD WITHOUT CONTRAST TECHNIQUE: Contiguous axial images were obtained from the base of the skull through the vertex without intravenous contrast. COMPARISON:  Head CT dated 06/16/2017 FINDINGS: Brain: There is mild age-related atrophy and chronic microvascular ischemic changes. There is no acute intracranial hemorrhage. No mass effect or midline shift noted. No extra-axial fluid collection. Vascular: No hyperdense vessel or unexpected calcification. Skull: Normal. Negative for fracture or focal lesion.  Sinuses/Orbits: No acute finding. Other: None IMPRESSION: 1. No acute intracranial pathology. 2. Age-related atrophy and chronic microvascular ischemic changes. Electronically Signed   By: Anner Crete M.D.   On: 09/03/2017 21:50     Phillips Climes M.D on 09/05/2017 at 2:03 PM  Between 7am to 7pm - Pager - [phone]  After 7pm go to [email or web address] - password [facility]  [facility] -  Office  [phone]

## 2017-09-05 NOTE — Progress Notes (Signed)
Nutrition Brief Note  Patient identified on the Malnutrition Screening Tool (MST) Report  Wt Readings from Last 15 Encounters:  09/05/17 231 lb 0.7 oz (104.8 kg)  06/16/17 190 lb (86.2 kg)  03/07/17 220 lb 6.4 oz (100 kg)  02/20/17 235 lb 14.3 oz (107 kg)  02/10/17 232 lb (105.2 kg)  01/14/17 244 lb 11.4 oz (111 kg)  12/16/16 225 lb (102.1 kg)  11/22/16 228 lb (103.4 kg)  11/11/16 235 lb (106.6 kg)  11/04/16 235 lb (106.6 kg)  07/05/16 228 lb 3.2 oz (103.5 kg)  03/30/16 214 lb (97.1 kg)  11/18/15 214 lb 1.1 oz (97.1 kg)  11/05/15 223 lb 8 oz (101.4 kg)  08/20/15 250 lb (113.4 kg)   Patient with PMH significant for COPD, HTN, HLD, CAD, bipolar disorder, seizure disorder, dermatomyositis/ RA, and CVA. Presents this admission with complaints of weakness and body aches. Pt denies any loss in appetite PTA. He typically eats three meals/day that consist of good protein sources. He endorses a UBW of 230 lb and denies any weight loss. No skin breakdown recorded. He does not wish to have supplementation.   Body mass index is 40.93 kg/m. Patient meets criteria for morbid obese based on current BMI.   Current diet order is heart healthy, patient is consuming approximately 50-70% of meals at this time. Labs and medications reviewed.   No nutrition interventions warranted at this time. If nutrition issues arise, please consult RD.   Mariana Single RD, LDN Clinical Nutrition Pager # [phone]

## 2017-09-05 NOTE — Progress Notes (Addendum)
LCSW spoke with Anderson Malta from [facility] care. Anderson Malta states she is unsure if patient can have his home health aides increased. Celeste that patient has refused SNF in the past and they have not been able to convenience him. Celeste asked if a Education officer, museum could be ordered with patient's home health PT. LCSW will follow up with MD.   Servando Snare, Shawna Clamp [place]

## 2017-09-06 ENCOUNTER — Other Ambulatory Visit: Payer: Self-pay

## 2017-09-06 LAB — BASIC METABOLIC PANEL
ANION GAP: 8 (ref 5–15)
BUN: 28 mg/dL — ABNORMAL HIGH (ref 8–23)
CALCIUM: 8.7 mg/dL — AB (ref 8.9–10.3)
CHLORIDE: 102 mmol/L (ref 98–111)
CO2: 30 mmol/L (ref 22–32)
Creatinine, Ser: 1.25 mg/dL — ABNORMAL HIGH (ref 0.61–1.24)
GFR calc non Af Amer: 57 mL/min — ABNORMAL LOW (ref >60)
GLUCOSE: 139 mg/dL — AB (ref 70–99)
POTASSIUM: 4 mmol/L (ref 3.5–5.1)
Sodium: 140 mmol/L (ref 135–145)

## 2017-09-06 NOTE — NC FL2 (Signed)
[place] LEVEL OF CARE SCREENING TOOL     IDENTIFICATION  Patient Name: Patrick Holt Birthdate: 1950-05-15 Sex: male Admission Date (Current Location): 09/03/2017  [facility] and [place] Number:  Herbalist and Address:  [facility],  [place] [address] [place]      Provider Number: [id number]  Attending Physician Name and Address:  Elgergawy, Silver Huguenin, MD  Relative Name and Phone Number:       Current Level of Care: Hospital Recommended Level of Care: [place] Prior Approval Number:    Date Approved/Denied:   PASRR Number: pending  Discharge Plan: SNF    Current Diagnoses: Patient Active Problem List   Diagnosis Date Noted  . Unstable angina ([place]) 06/16/2017  . L1 and L2 vertebral fracture ([place]) 04/06/2017  . Multiple rib fractures 04/06/2017  . Evaluation by psychiatric service required 03/07/2017  . Acute metabolic encephalopathy 98/33/8250  . Hypoglycemia 03/04/2017  . Acute respiratory failure with hypoxia ([place]) 2020/05/717  . Acute encephalopathy 2020/05/717  . Rhabdomyolysis 02/05/2017  . Acute respiratory failure with hypoxia and hypercapnia ([place]) 02/05/2017  . COPD with acute exacerbation ([place]) 02/05/2017  . Bipolar 1 disorder ([place]) 02/05/2017  . Anemia 02/05/2017  . Hypertension 02/05/2017  . Hand fracture, right 02/05/2017  . Narcotic dependence ([place]) 02/05/2017  . Pre-diabetes 02/05/2017  . PAF (paroxysmal atrial fibrillation) ([place]) 02/05/2017  . Frequent falls 02/05/2017  . History of dermatomyositis 02/05/2017  . Seizure disorder ([place]) 02/05/2017  . Bilateral lower extremity edema 02/05/2017  . Pulmonary artery hypertension ([place]) 01/15/2017  . Altered mental status 01/13/2017  . Low oxygen saturation   . Pain in right wrist 02/27/2016  . Altered mental state 11/14/2015  . Severe dehydration 11/14/2015  . Hypernatremia 11/14/2015  . Difficulty in walking, not elsewhere classified    . Right wrist fracture   . Closed displaced fracture of ulnar styloid with routine healing   . Scapholunate ligament injury with no instability   . Falls frequently   . Hypoxia 11/02/2015  . Anxiety 06/06/2015  . Impaired mobility 04/28/2015  . Lumbar spondylosis with myelopathy 03/07/2015  . Nocturia 12/03/2014  . Chronic venous insufficiency 09/23/2014  . Poor social situation 07/30/2014  . Schizophrenia, acute ([place])   . Schizoaffective disorder, bipolar type ([place])   . Decreased visual acuity 06/27/2014  . COPD exacerbation ([place])   . Right sided weakness 01/04/2014  . Essential hypertension, benign   . Atrial fibrillation, unspecified   . Radiculopathy of cervical region   . Polyuria 12/07/2013  . Erectile dysfunction 12/07/2013  . Diverticulitis 08/01/2013  . Acute diverticulitis 08/01/2013  . Hematochezia 06/04/2013  . Hypokalemia 10/03/2012  . Benign neoplasm of colon 09/27/2012  . Stricture and stenosis of esophagus 09/27/2012  . Diverticulitis of colon without hemorrhage 07/26/2012  . Polypharmacy 03/26/2012  . Seizures ([place]) 02/10/2012  . Atrial fibrillation ([place]) 02/05/2012  . Coronary atherosclerosis of native coronary artery 02/05/2012  . Radicular low back pain 03/09/2011  . History of seizure disorder 01/29/2011  . Irritable bowel syndrome (IBS) 06/24/2010  . Urge incontinence 06/19/2010  . Tobacco abuse 11/23/2008  . DEPRESSION, MAJOR 05/31/2008  . ISCHEMIC HEART DISEASE 05/31/2008  . RAYNAUD'S DISEASE 05/31/2008  . HIATAL HERNIA 05/31/2008  . Rheumatoid arthritis ([place]) 05/31/2008  . FIBROMYALGIA 05/31/2008  . Hyperlipidemia 01/26/2008  . Anxiety state 01/26/2008  . HEMORRHOIDS, INTERNAL 01/26/2008  . COPD (chronic obstructive pulmonary disease) ([place]) 01/26/2008  . ESOPHAGEAL MOTILITY DISORDER 01/26/2008  .  GERD 01/26/2008  . Dermatomyositis ([place]) 01/26/2008    Orientation RESPIRATION BLADDER Height & Weight     Self, Time, Situation, Place  O2  External catheter Weight: 231 lb 0.7 oz (104.8 kg) Height:  5\' 3"  (160 cm)  BEHAVIORAL SYMPTOMS/MOOD NEUROLOGICAL BOWEL NUTRITION STATUS      Continent Diet(See dc summary)  AMBULATORY STATUS COMMUNICATION OF NEEDS Skin   Extensive Assist Verbally Normal                       Personal Care Assistance Level of Assistance  Bathing, Feeding, Dressing Bathing Assistance: Limited assistance Feeding assistance: Independent Dressing Assistance: Limited assistance     Functional Limitations Info  Sight, Hearing, Speech Sight Info: Impaired Hearing Info: Adequate Speech Info: Adequate    SPECIAL CARE FACTORS FREQUENCY  PT (By licensed PT), OT (By licensed OT)     PT Frequency: 5x/week OT Frequency: 5x/week            Contractures Contractures Info: Not present    Additional Factors Info  Code Status, Allergies Code Status Info: Full Allergies Info: Immune Globulins, Ciprofloxacin, Flagyl Metronidazole, Lisinopril, Sulfa Antibiotics, Cefepime, Morphine And Related, Requip Ropinirole Hcl, Vancomycin, Betamethasone Dipropionate, Bupropion Hcl, Clobetasol, Codeine, Escitalopram Oxalate, Fluoxetine Hcl, Meclizine, Paroxetine, Penicillins, Tacrolimus, Tetanus Toxoid, Tuberculin Purified Protein Derivative           Current Medications (09/06/2017):  This is the current hospital active medication list Current Facility-Administered Medications  Medication Dose Route Frequency Provider Last Rate Last Dose  . acetaminophen (TYLENOL) tablet 650 mg  650 mg Oral Q6H PRN Hugelmeyer, Alexis, DO       Or  . acetaminophen (TYLENOL) suppository 650 mg  650 mg Rectal Q6H PRN Hugelmeyer, Alexis, DO      . amiodarone (PACERONE) tablet 200 mg  200 mg Oral Daily Hugelmeyer, Alexis, DO   200 mg at 09/06/17 0912  . amLODipine (NORVASC) tablet 5 mg  5 mg Oral Daily Hugelmeyer, Alexis, DO   5 mg at 09/06/17 0912  . aspirin EC tablet 81 mg  81 mg Oral Daily Hugelmeyer, Alexis, DO   81 mg at  09/06/17 0912  . atorvastatin (LIPITOR) tablet 40 mg  40 mg Oral Daily Hugelmeyer, Alexis, DO   40 mg at 09/06/17 0912  . bisacodyl (DULCOLAX) EC tablet 5 mg  5 mg Oral Daily PRN Hugelmeyer, Alexis, DO      . cefTRIAXone (ROCEPHIN) 1 g in sodium chloride 0.9 % 100 mL IVPB  1 g Intravenous Q24H Elgergawy, Silver Huguenin, MD   Stopped at 09/05/17 1316  . dextromethorphan-guaiFENesin (MUCINEX DM) 30-600 MG per 12 hr tablet 1 tablet  1 tablet Oral BID Hugelmeyer, Alexis, DO   1 tablet at 09/06/17 0912  . divalproex (DEPAKOTE) DR tablet 250 mg  250 mg Oral TID Hugelmeyer, Alexis, DO   250 mg at 09/06/17 0912  . enoxaparin (LOVENOX) injection 40 mg  40 mg Subcutaneous Q24H Hugelmeyer, Alexis, DO   40 mg at 09/06/17 0913  . fluticasone furoate-vilanterol (BREO ELLIPTA) 200-25 MCG/INH 1 puff  1 puff Inhalation Daily Hugelmeyer, Alexis, DO   1 puff at 09/06/17 0723  . furosemide (LASIX) tablet 20 mg  20 mg Oral Daily Hugelmeyer, Alexis, DO   20 mg at 09/06/17 0912  . ipratropium-albuterol (DUONEB) 0.5-2.5 (3) MG/3ML nebulizer solution 3 mL  3 mL Inhalation Q6H PRN Hugelmeyer, Alexis, DO      . levETIRAcetam (KEPPRA) tablet 500 mg  500 mg Oral  BID Hugelmeyer, Alexis, DO   500 mg at 09/06/17 0912  . loperamide (IMODIUM) capsule 2 mg  2 mg Oral PRN Hugelmeyer, Alexis, DO      . magnesium citrate solution 1 Bottle  1 Bottle Oral Once PRN Hugelmeyer, Alexis, DO      . multivitamin with minerals tablet 1 tablet  1 tablet Oral Daily Hugelmeyer, Alexis, DO   1 tablet at 09/06/17 0912  . nystatin (MYCOSTATIN) 100000 UNIT/ML suspension 500,000 Units  5 mL Oral QID Vertis Kelch, NP   500,000 Units at 09/06/17 0912  . ondansetron (ZOFRAN) tablet 4 mg  4 mg Oral Q6H PRN Hugelmeyer, Alexis, DO       Or  . ondansetron (ZOFRAN) injection 4 mg  4 mg Intravenous Q6H PRN Hugelmeyer, Alexis, DO      . oxybutynin (DITROPAN) tablet 5 mg  5 mg Oral TID Hugelmeyer, Alexis, DO   5 mg at 09/06/17 0912  . pantoprazole (PROTONIX)  EC tablet 40 mg  40 mg Oral Daily Hugelmeyer, Alexis, DO   40 mg at 09/06/17 0912  . potassium chloride SA (K-DUR,KLOR-CON) CR tablet 20 mEq  20 mEq Oral Daily Hugelmeyer, Alexis, DO   20 mEq at 09/06/17 0912  . predniSONE (DELTASONE) tablet 40 mg  40 mg Oral Q breakfast Elgergawy, Silver Huguenin, MD   40 mg at 09/06/17 0748  . risperiDONE (RISPERDAL) tablet 1 mg  1 mg Oral BID Hugelmeyer, Alexis, DO   1 mg at 09/06/17 0912  . senna-docusate (Senokot-S) tablet 1 tablet  1 tablet Oral QHS PRN Hugelmeyer, Alexis, DO      . traMADol (ULTRAM) tablet 50 mg  50 mg Oral Q6H PRN Hugelmeyer, Alexis, DO   50 mg at 09/04/17 2032     Discharge Medications: Please see discharge summary for a list of discharge medications.  Relevant Imaging Results:  Relevant Lab Results:   Additional Information SS#: [id number]  Servando Snare, LCSW

## 2017-09-06 NOTE — Care Management Important Message (Signed)
Important Message  Patient Details  Name: Patrick Holt MRN: [id number] Date of Birth: [birth year]   Medicare Important Message Given:  Yes    Kerin Salen 09/06/2017, 11:42 AMImportant Message  Patient Details  Name: Patrick Holt MRN: [id number] Date of Birth: [birth year]   Medicare Important Message Given:  Yes    Kerin Salen 09/06/2017, 11:41 AM

## 2017-09-06 NOTE — Progress Notes (Signed)
Patient chose bed at [facility].   Facility can admit patient tomorrow.   Carolin Coy [place]

## 2017-09-06 NOTE — Evaluation (Addendum)
Occupational Therapy Evaluation Patient Details Name: Patrick Holt MRN: [id number] DOB: [birth year] Today's Date: 09/06/2017    History of Present Illness  67 y.o. male with a known history of COPD, HTN, HLD, CAD,  Atrial fib, bipolar disorder, seizure disorder, dermatomyositis/ RA, CVA presents to the emergency department for evaluation of weakness, body aches.    Clinical Impression   This 67 y/o male presents with the above. At baseline pt reports he lives alone, has an aide who assists with bathing/dressing ADLs 3x/week and reports using RW for functional mobility. Pt presenting with increased weakness and decreased activity tolerance. Currently requires modA for bed mobility and sit<>stand at RW, minA to take small side steps along EOB using RW. Pt currently requiring minguard-minA for seated UB ADL, max-totalA for LB ADL. HR up to 136 and lowest SpO2 noted 87% on 3L during brief activity, rebounding to >90% with rest and deep breathing techniques. Pt will benefit from continued acute OT services; spoke with pt at start of session and pt verbalizing now agreeable to considering SNF level therapy services at time of discharge. Feel this is appropriate given pt's current level of assist and lack of 24hr assist at home. Will follow.     Follow Up Recommendations  SNF;Supervision/Assistance - 24 hour    Equipment Recommendations  3 in 1 bedside commode;Other (comment)(to be further assessed in next venue )           Precautions / Restrictions Precautions Precautions: Fall Precaution Comments: mulitple falls in past year; most recent 1 month ago; monitor HR  Restrictions Weight Bearing Restrictions: No      Mobility Bed Mobility Overal bed mobility: Needs Assistance Bed Mobility: Supine to Sit;Sit to Supine     Supine to sit: Mod assist Sit to supine: Mod assist   General bed mobility comments: assist to raise trunk, advance BLEs, and to scoot hips towards EOB; assist to  bring LEs into bed  Transfers Overall transfer level: Needs assistance Equipment used: Rolling walker (2 wheeled) Transfers: Sit to/from Stand Sit to Stand: Mod assist         General transfer comment: assist to boost into standing and to steady at RW, VCs hand placement; pt noted to brace LEs against EOB for added stability upon standing     Balance Overall balance assessment: Needs assistance;History of Falls Sitting-balance support: Feet supported Sitting balance-Leahy Scale: Fair Sitting balance - Comments: slight lateral lean with dynamic activity, pt able to correct himself  Postural control: Left lateral lean Standing balance support: Bilateral upper extremity supported Standing balance-Leahy Scale: Poor Standing balance comment: relies on BUE support                           ADL either performed or assessed with clinical judgement   ADL Overall ADL's : Needs assistance/impaired Eating/Feeding: Moderate assistance;Sitting Eating/Feeding Details (indicate cue type and reason): due to tremors, may benefit from adaptive utensils  Grooming: Set up;Sitting;Wash/dry face Grooming Details (indicate cue type and reason): sitting EOB  Upper Body Bathing: Sitting;Minimal assistance   Lower Body Bathing: Maximal assistance;Sit to/from stand   Upper Body Dressing : Minimal assistance;Sitting   Lower Body Dressing: Maximal assistance;Sit to/from stand Lower Body Dressing Details (indicate cue type and reason): assist to don socks seated EOB as pt not able to reach feet; modA for sit<>stand and minA for standing balance      Toileting- Clothing Manipulation and Hygiene: Total assistance;Sit to/from  stand Toileting - Clothing Manipulation Details (indicate cue type and reason): pt with condom cath     Functional mobility during ADLs: Minimal assistance;Rolling walker General ADL Comments: pt with increased fatigue during minimal activity, completed bed mobility and  taking few small side steps along EOB prior to return to sitting; pt noted to be in a-fib wit HR up to 136 after activity, returned to supine in bed and positioned for comfort; pt on 3L O2 during session with SpO2 decreasing to 87% during session, 93% end of session with rest break and cues for deep breathing technique      Vision         Perception     Praxis      Pertinent Vitals/Pain Pain Assessment: Faces Faces Pain Scale: Hurts little more Pain Location: bil feet with wt bearing Pain Descriptors / Indicators: Grimacing Pain Intervention(s): Monitored during session;Repositioned;Limited activity within patient's tolerance     Hand Dominance Right   Extremity/Trunk Assessment Upper Extremity Assessment Upper Extremity Assessment: Generalized weakness;RUE deficits/detail;LUE deficits/detail RUE Deficits / Details: limited AROM due to weakness, grossly 3-/5, tremors noted  LUE Deficits / Details: limited AROM due to weakness, grossly 3-/5, tremors noted    Lower Extremity Assessment Lower Extremity Assessment: Defer to PT evaluation   Cervical / Trunk Assessment Cervical / Trunk Assessment: Normal   Communication Communication Communication: No difficulties   Cognition Arousal/Alertness: Awake/alert Behavior During Therapy: WFL for tasks assessed/performed Overall Cognitive Status: History of cognitive impairments - at baseline                                 General Comments: pt reports STM deficits at baseline, noted intermittently during session as well    General Comments  HR up to 136 with minimal activity this session     Exercises     Shoulder Instructions      Home Living Family/patient expects to be discharged to:: Private residence Living Arrangements: Alone Available Help at Discharge: Personal care attendant Type of Home: Apartment Home Access: Elevator     Home Layout: One level     Bathroom Shower/Tub: Medical illustrator: Standard     Home Equipment: Environmental consultant - 2 wheels;Cane - single point;Electric scooter   Additional Comments: Ship broker      Prior Functioning/Environment Level of Independence: Engineer, materials / Transfers Assistance Needed: amb short distances with RW and uses scooter for longer distances--was unable to get out of bed for 2 days prior to admission ADL's / Homemaking Assistance Needed: aide assists with bathing/dressing; sponge bathes   Comments: aide comes 3x/week- pt reports he doesn't eat unless aide is there to provide him food, unsure of accuracy of this report        OT Problem List: Decreased strength;Impaired balance (sitting and/or standing);Decreased activity tolerance;Decreased knowledge of use of DME or AE;Decreased range of motion;Cardiopulmonary status limiting activity      OT Treatment/Interventions: Self-care/ADL training;DME and/or AE instruction;Therapeutic activities;Balance training;Patient/family education;Energy conservation;Therapeutic exercise    OT Goals(Current goals can be found in the care plan section) Acute Rehab OT Goals Patient Stated Goal: to get more help-pt now agreeable to SNF  OT Goal Formulation: With patient Time For Goal Achievement: 09/20/17 Potential to Achieve Goals: Good  OT Frequency: Min 2X/week   Barriers to D/C:            Co-evaluation  AM-PAC PT "6 Clicks" Daily Activity     Outcome Measure Help from another person eating meals?: A Little Help from another person taking care of personal grooming?: A Little Help from another person toileting, which includes using toliet, bedpan, or urinal?: Total Help from another person bathing (including washing, rinsing, drying)?: A Lot Help from another person to put on and taking off regular upper body clothing?: A Lot Help from another person to put on and taking off regular lower body clothing?: Total 6 Click Score: 12    End of Session Equipment Utilized During Treatment: Rolling walker;Oxygen Nurse Communication: Mobility status;Other (comment)(condom cath needs replaced )  Activity Tolerance: Patient tolerated treatment well;Patient limited by fatigue Patient left: in bed;with call bell/phone within reach  OT Visit Diagnosis: Muscle weakness (generalized) (M62.81);Unsteadiness on feet (R26.81)                Time: 5035-4656 OT Time Calculation (min): 29 min Charges:  OT General Charges $OT Visit: 1 Visit OT Evaluation $OT Eval Moderate Complexity: 1 Mod OT Treatments $Self Care/Home Management : 8-22 mins G-Codes:     Lou Cal, OT Pager [phone] 09/06/2017   Raymondo Band 09/06/2017, 9:08 AM

## 2017-09-06 NOTE — Progress Notes (Signed)
Per OT patient is now open to SNF for rehab. LCSW will fax patient out to facilities.

## 2017-09-06 NOTE — Progress Notes (Signed)
PROGRESS NOTE                                                                                                                                                                                                             Patient Demographics:    Patrick Holt, is a 67 y.o. male, DOB - [birth year], UYQ:[id number]  Admit date - 09/03/2017   Admitting Physician Cristy Folks, MD  Outpatient Primary MD for the patient is [facility]  LOS - 3   Chief Complaint  Patient presents with  . Generalized Body Aches       Brief Narrative   Patrick Holt is a 67 y.o. male with a known history of COPD, HTN, HLD, CAD,  Atrial fib, bipolar disorder, seizure disorder, dermatomyositis/ RA, CVA presents to the emergency department for evaluation of weakness, body aches.  Patient was in a usual state of health until several weeks ago when he reports the onset of cough, bilateral lower extremity scaling and edema.  He reports the onset of weakness over the past few days consistent with his muscular dystrophy flares    Subjective:    Patrick Holt today has, No headache, No chest pain, No abdominal pain , he reports weakness is improving  Assessment  & Plan :    Active Problems:   Altered mental status  Generalized weakness -It is nonfocal, generalized, CT head with no acute findings, possibly related to dermatomyositis flare, or UTI, He was started on IV Solu-Medrol, transitioned to oral prednisone, with recommendation for outpatient rheumatology follow-up.  He can be discharged on prednisone taper on SNF bed is available. -PT consulted, recommendation for SNF placement, discussed with social worker, hopefully will have bed availability tomorrow  UTI -Initially on Azactam given multiple allergies, currently on Rocephin, urine cultures unhelpful as growing multiple species  COPD, chronic, hypoxia -Continue with home meds Symbicort,  encouraged to use incentive spirometry -We will need oxygen on discharge  A. Fib -Maintaining normal sinus rhythm, on amiodarone, anticoagulation secondary to history to subdural hematoma and recurrent falls -To have sinus bradycardia on admission, and pauses on Dmitriy 2.7 seconds earlier this morning, his primary cardiologist Dr. Harrington Challenger was available, she reviewed telemetry strip, reviewed the case, patient on not on anticoagulation, and these were symptomatic bradycardia/pauses, recommendation was to continue amiodarone at current  dose to maintain normal sinus rhythm at the he is not a candidate for anticoagulation.  Acute DVT -Venous Doppler showing evidence of acute DVT involving the intramuscular gastrocnemius vein in the right lower extremity, have discussed these findings with vascular surgeon on-call Dr. Vallarie Mare, this patient is high risk for anticoagulation given history of subdural hematoma, multiple falls, and this is small below-knee DVT, be managed with compression stockings, and repeat Doppler in 1 month attention of blood clots, to follow with Dr. Bridgett Larsson in 1 month as an outpatient.  AKI -Creatinine 1.3 on admission, baseline around 1, it is 1.2 today.  Chronic diastolic CHF -Appears to be euvolemic, continue with home dose Lasix  Esther bipolar -Continue with home meds  History of seizure disorders -Continue with home meds  Hyperlipidemia -Continue with statin  CAD -Continue with aspirin    Code Status : FULL  Family Communication  : NONE AT [facility]DSIDE  Disposition Plan  : Seen by PT, will need SNF placement, will be discharged when bed is available, discussed with Education officer, museum, hopefully by tomorrow  Consults  :  NONE  Procedures  : none  DVT Prophylaxis  :  lovenox  Lab Results  Component Value Date   PLT 298 09/05/2017    Antibiotics  :    Anti-infectives (From admission, onward)   Start     Dose/Rate Route Frequency Ordered Stop   09/05/17 1200   cefTRIAXone (ROCEPHIN) 1 g in sodium chloride 0.9 % 100 mL IVPB     1 g 200 mL/hr over 30 Minutes Intravenous Every 24 hours 09/05/17 1032     09/04/17 2000  aztreonam (AZACTAM) 1 g in sodium chloride 0.9 % 100 mL IVPB  Status:  Discontinued     1 g 200 mL/hr over 30 Minutes Intravenous Every 8 hours 09/04/17 1007 09/05/17 1031   09/04/17 1100  aztreonam (AZACTAM) 1 g in sodium chloride 0.9 % 100 mL IVPB     1 g 200 mL/hr over 30 Minutes Intravenous  Once 09/04/17 1007 09/04/17 1345        Objective:   Vitals:   09/06/17 0518 09/06/17 0724 09/06/17 0751 09/06/17 1439  BP: 114/61   (!) 111/47  Pulse: (!) 55   67  Resp: 18   17  Temp: 98.6 F (37 C)   98.6 F (37 C)  TempSrc:      SpO2: 91% (!) 9% 91% 93%  Weight:      Height:        Wt Readings from Last 3 Encounters:  09/05/17 104.8 kg (231 lb 0.7 oz)  06/16/17 86.2 kg (190 lb)  03/07/17 100 kg (220 lb 6.4 oz)     Intake/Output Summary (Last 24 hours) at 09/06/2017 1541 Last data filed at 09/06/2017 1214 Gross per 24 hour  Intake 920 ml  Output 400 ml  Net 520 ml     Physical Exam  Awake Alert, Oriented X 3, No new F.N deficits, Normal affect Symmetrical Chest wall movement, Good air movement bilaterally, CTAB RRR,No Gallops,Rubs or new Murmurs, No Parasternal Heave +ve B.Sounds, Abd Soft, No tenderness, No rebound - guarding or rigidity. No Cyanosis, Clubbing, +1 edema no new Rash or bruise      Data Review:    CBC Recent Labs  Lab 09/03/17 1901 09/04/17 0626 09/05/17 0802  WBC 5.2 5.1 11.3*  HGB 11.9* 12.5* 13.7  HCT 38.0* 40.2 42.2  PLT 244 242 298  MCV 85.4 85.9 84.2  MCH 26.7  26.7 27.3  MCHC 31.3 31.1 32.5  RDW 19.6* 19.6* 18.6*  LYMPHSABS 2.2  --   --   MONOABS 0.6  --   --   EOSABS 0.2  --   --   BASOSABS 0.0  --   --     Chemistries  Recent Labs  Lab 09/03/17 1901 09/04/17 0235 09/05/17 0802 09/06/17 0555  NA 142 141 139 140  K 4.7 4.2 3.7 4.0  CL 107 104 100 102  CO2 27 29  28 30   GLUCOSE 88 78 150* 139*  BUN 14 14 23  28*  CREATININE 1.36* 1.37* 1.44* 1.25*  CALCIUM 8.7* 8.7* 9.0 8.7*  MG  --  1.9  --   --   AST 18 12*  --   --   ALT 7 7  --   --   ALKPHOS 53 57  --   --   BILITOT 1.2 0.9  --   --    ------------------------------------------------------------------------------------------------------------------ No results for input(s): CHOL, HDL, LDLCALC, TRIG, CHOLHDL, LDLDIRECT in the last 72 hours.  Lab Results  Component Value Date   HGBA1C 5.9 (H) 01/14/2017   ------------------------------------------------------------------------------------------------------------------ Recent Labs    09/04/17 0235  TSH 1.704   ------------------------------------------------------------------------------------------------------------------ No results for input(s): VITAMINB12, FOLATE, FERRITIN, TIBC, IRON, RETICCTPCT in the last 72 hours.  Coagulation profile No results for input(s): INR, PROTIME in the last 168 hours.  No results for input(s): DDIMER in the last 72 hours.  Cardiac Enzymes Recent Labs  Lab 09/04/17 0235 09/04/17 0827 09/04/17 1310  TROPONINI <0.03 <0.03 <0.03   ------------------------------------------------------------------------------------------------------------------    Component Value Date/Time   BNP 36.1 09/03/2017 1901    Inpatient Medications  Scheduled Meds: . amiodarone  200 mg Oral Daily  . amLODipine  5 mg Oral Daily  . aspirin EC  81 mg Oral Daily  . atorvastatin  40 mg Oral Daily  . dextromethorphan-guaiFENesin  1 tablet Oral BID  . divalproex  250 mg Oral TID  . enoxaparin (LOVENOX) injection  40 mg Subcutaneous Q24H  . fluticasone furoate-vilanterol  1 puff Inhalation Daily  . furosemide  20 mg Oral Daily  . levETIRAcetam  500 mg Oral BID  . multivitamin with minerals  1 tablet Oral Daily  . nystatin  5 mL Oral QID  . oxybutynin  5 mg Oral TID  . pantoprazole  40 mg Oral Daily  . potassium  chloride SA  20 mEq Oral Daily  . predniSONE  40 mg Oral Q breakfast  . risperiDONE  1 mg Oral BID   Continuous Infusions: . cefTRIAXone (ROCEPHIN)  IV Stopped (09/06/17 1214)   PRN Meds:.acetaminophen **OR** acetaminophen, bisacodyl, ipratropium-albuterol, loperamide, magnesium citrate, ondansetron **OR** ondansetron (ZOFRAN) IV, senna-docusate, traMADol  Micro Results Recent Results (from the past 240 hour(s))  Urine culture     Status: Abnormal   Collection Time: 09/03/17  8:45 PM  Result Value Ref Range Status   Specimen Description   Final    URINE, CLEAN CATCH Performed at [facility], [place] [address], [place], [place] [address]    Special Requests   Final    NONE Performed at [facility], [place] [address] [place], [place] [address]    Culture MULTIPLE SPECIES PRESENT, SUGGEST RECOLLECTION (A)  Final   Report Status 09/05/2017 FINAL  Final    Radiology Reports Dg Chest 2 View  Result Date: 09/03/2017 CLINICAL DATA:  Generalized weakness.  Hypertension. EXAM: CHEST - 2 VIEW COMPARISON:  Jun 16, 2017 FINDINGS: There is a small pleural effusion on each side with bibasilar atelectatic change. There is no frank edema or consolidation. Heart is upper normal in size with pulmonary vascularity normal. No adenopathy. There is thoracolumbar levoscoliosis. IMPRESSION: Small pleural effusions with bibasilar atelectasis. No frank consolidation. Stable cardiac silhouette. Electronically Signed   By: Lowella Grip III M.D.   On: 09/03/2017 18:21   Ct Head Wo Contrast  Result Date: 09/03/2017 CLINICAL DATA:  67 year old male with altered mental status. EXAM: CT HEAD WITHOUT CONTRAST TECHNIQUE: Contiguous axial images were obtained from the base of the skull through the vertex without intravenous contrast. COMPARISON:  Head CT dated 06/16/2017 FINDINGS: Brain: There is mild age-related atrophy and chronic microvascular ischemic changes. There is no acute  intracranial hemorrhage. No mass effect or midline shift noted. No extra-axial fluid collection. Vascular: No hyperdense vessel or unexpected calcification. Skull: Normal. Negative for fracture or focal lesion. Sinuses/Orbits: No acute finding. Other: None IMPRESSION: 1. No acute intracranial pathology. 2. Age-related atrophy and chronic microvascular ischemic changes. Electronically Signed   By: Anner Crete M.D.   On: 09/03/2017 21:50     Phillips Climes M.D on 09/06/2017 at 3:41 PM  Between 7am to 7pm - Pager - [phone]  After 7pm go to [email or web address] - password [facility]  [facility] -  Office  [phone]

## 2017-09-07 DIAGNOSIS — E785 Hyperlipidemia, unspecified: Secondary | ICD-10-CM

## 2017-09-07 DIAGNOSIS — R0902 Hypoxemia: Secondary | ICD-10-CM

## 2017-09-07 DIAGNOSIS — N390 Urinary tract infection, site not specified: Principal | ICD-10-CM

## 2017-09-07 DIAGNOSIS — R4182 Altered mental status, unspecified: Secondary | ICD-10-CM

## 2017-09-07 DIAGNOSIS — R531 Weakness: Secondary | ICD-10-CM

## 2017-09-07 DIAGNOSIS — N179 Acute kidney failure, unspecified: Secondary | ICD-10-CM

## 2017-09-07 DIAGNOSIS — G9340 Encephalopathy, unspecified: Secondary | ICD-10-CM

## 2017-09-07 MED ORDER — ATORVASTATIN CALCIUM 40 MG PO TABS: 40.0000 mg | ORAL_TABLET | Freq: Every day | ORAL | 0 refills | Status: AC

## 2017-09-07 MED ORDER — FUROSEMIDE 20 MG PO TABS: 20.0000 mg | ORAL_TABLET | Freq: Every day | ORAL | Status: DC

## 2017-09-07 MED ORDER — PREDNISONE 20 MG PO TABS: 20.0000 mg | ORAL_TABLET | Freq: Every day | ORAL | Status: DC

## 2017-09-07 MED ORDER — PREDNISONE 10 MG PO TABS: 10.0000 mg | ORAL_TABLET | Freq: Every day | ORAL | Status: DC

## 2017-09-07 MED ORDER — CEFDINIR 300 MG PO CAPS
300.0000 mg | ORAL_CAPSULE | Freq: Two times a day (BID) | ORAL | Status: DC
  Administered 2017-09-07: 300 mg via ORAL
  Filled 2017-09-07: qty 1

## 2017-09-07 MED ORDER — TRAMADOL HCL 50 MG PO TABS: 50.0000 mg | ORAL_TABLET | Freq: Four times a day (QID) | ORAL | Status: DC | PRN

## 2017-09-07 MED ORDER — AMLODIPINE BESYLATE 5 MG PO TABS: 5.0000 mg | ORAL_TABLET | Freq: Every day | ORAL | 0 refills | Status: DC

## 2017-09-07 MED ORDER — AMIODARONE HCL 200 MG PO TABS: 200.0000 mg | ORAL_TABLET | Freq: Every day | ORAL | 0 refills | Status: DC

## 2017-09-07 MED ORDER — ERGOCALCIFEROL 1.25 MG (50000 UT) PO CAPS: 50000.0000 [IU] | ORAL_CAPSULE | ORAL | Status: AC

## 2017-09-07 MED ORDER — IPRATROPIUM-ALBUTEROL 0.5-2.5 (3) MG/3ML IN SOLN: 3.0000 mL | Freq: Four times a day (QID) | RESPIRATORY_TRACT | Status: AC | PRN

## 2017-09-07 MED ORDER — DIVALPROEX SODIUM 250 MG PO DR TAB: 250.0000 mg | DELAYED_RELEASE_TABLET | Freq: Three times a day (TID) | ORAL | Status: AC

## 2017-09-07 MED ORDER — ASPIRIN EC 81 MG PO TBEC: 81.0000 mg | DELAYED_RELEASE_TABLET | Freq: Every day | ORAL | 3 refills | Status: DC

## 2017-09-07 MED ORDER — CEFDINIR 300 MG PO CAPS: 300.0000 mg | ORAL_CAPSULE | Freq: Two times a day (BID) | ORAL | 0 refills | Status: DC

## 2017-09-07 MED ORDER — PREDNISONE 5 MG PO TABS: 5.0000 mg | ORAL_TABLET | Freq: Every day | ORAL | Status: DC

## 2017-09-07 MED ORDER — CEFDINIR 300 MG PO CAPS
300.0000 mg | ORAL_CAPSULE | Freq: Two times a day (BID) | ORAL | 0 refills | Status: DC
Start: 2017-09-07 — End: 2017-09-19

## 2017-09-07 MED ORDER — POTASSIUM CHLORIDE ER 20 MEQ PO TBCR: 20.0000 meq | EXTENDED_RELEASE_TABLET | Freq: Every day | ORAL | 0 refills | Status: DC

## 2017-09-07 MED ORDER — TRAMADOL HCL 50 MG PO TABS: 50.0000 mg | ORAL_TABLET | Freq: Four times a day (QID) | ORAL | 0 refills | Status: AC | PRN

## 2017-09-07 MED ORDER — BUDESONIDE-FORMOTEROL FUMARATE 160-4.5 MCG/ACT IN AERO: 2.0000 | INHALATION_SPRAY | Freq: Two times a day (BID) | RESPIRATORY_TRACT | 0 refills | Status: AC

## 2017-09-07 MED ORDER — PREDNISONE 20 MG PO TABS: 30.0000 mg | ORAL_TABLET | Freq: Every day | ORAL | Status: DC

## 2017-09-07 MED ORDER — LEVETIRACETAM 500 MG PO TABS: 500.0000 mg | ORAL_TABLET | Freq: Two times a day (BID) | ORAL | Status: AC

## 2017-09-07 MED ORDER — OXYBUTYNIN CHLORIDE 5 MG PO TABS: 5.0000 mg | ORAL_TABLET | Freq: Three times a day (TID) | ORAL | Status: AC

## 2017-09-07 MED ORDER — ADULT MULTIVITAMIN W/MINERALS CH: 1.0000 | ORAL_TABLET | Freq: Every day | ORAL | 3 refills | Status: AC

## 2017-09-07 MED ORDER — PREDNISONE 10 MG PO TABS: 30.0000 mg | ORAL_TABLET | Freq: Every day | ORAL | Status: DC

## 2017-09-07 MED ORDER — SENNOSIDES-DOCUSATE SODIUM 8.6-50 MG PO TABS: 1.0000 | ORAL_TABLET | Freq: Every evening | ORAL | Status: AC | PRN

## 2017-09-07 MED ORDER — ESOMEPRAZOLE MAGNESIUM 40 MG PO CPDR: 40.0000 mg | DELAYED_RELEASE_CAPSULE | Freq: Every day | ORAL | Status: AC

## 2017-09-07 MED ORDER — RISPERIDONE 1 MG PO TABS: 1.0000 mg | ORAL_TABLET | Freq: Two times a day (BID) | ORAL | Status: AC

## 2017-09-07 MED ORDER — LOPERAMIDE HCL 2 MG PO CAPS: 2.0000 mg | ORAL_CAPSULE | ORAL | 0 refills | Status: AC | PRN

## 2017-09-07 NOTE — Progress Notes (Signed)
RN tried second time to call for report. RN Remus Blake got a report at [employer]

## 2017-09-07 NOTE — Progress Notes (Signed)
Physical Therapy Treatment Patient Details Name: Patrick Holt MRN: [id number] DOB: [birth year] Today's Date: 09/07/2017    History of Present Illness  67 y.o. male with a known history of COPD, HTN, HLD, CAD,  Atrial fib, bipolar disorder, seizure disorder, dermatomyositis/ RA, CVA presents to the emergency department for evaluation of weakness, body aches.     PT Comments    Min assist to transfer bed to recliner then back to bed. Sit to stand x 4 for pericare. Pt was fatigued from toileting so ambulation deferred. NT notified of heart monitor needing new batteries and new finger probe, and that condom cath was off and bed saturated in urine.    Follow Up Recommendations  SNF;Supervision/Assistance - 24 hour;Supervision for mobility/OOB     Equipment Recommendations  None recommended by PT    Recommendations for Other Services       Precautions / Restrictions Precautions Precautions: Fall Precaution Comments: mulitple falls in past year; most recent 1 month ago; monitor HR  Restrictions Weight Bearing Restrictions: No    Mobility  Bed Mobility Overal bed mobility: Needs Assistance Bed Mobility: Supine to Sit;Sit to Supine;Rolling Rolling: Mod assist   Supine to sit: Mod assist Sit to supine: Mod assist   General bed mobility comments: assist to raise trunk, advance BLEs, and to scoot hips towards EOB; assist to bring LEs into bed  Transfers Overall transfer level: Needs assistance Equipment used: Rolling walker (2 wheeled) Transfers: Sit to/from [employer] Transfers Sit to Stand: Min assist;From elevated surface Stand pivot transfers: Min assist       General transfer comment: SPT from bed to 3 in 1 then back to bed, sit to stand x 4 for transfers and pericare; assist to boost into standing and to steady at RW, VCs hand placement; pt noted to brace LEs against EOB for added stability upon standing   Ambulation/Gait             General Gait  Details: deferred 2* foot pain and fatigue after toileting   Stairs             Wheelchair Mobility    Modified Rankin (Stroke Patients Only)       Balance Overall balance assessment: Needs assistance;History of Falls Sitting-balance support: Feet supported Sitting balance-Leahy Scale: Fair Sitting balance - Comments: slight lateral lean with dynamic activity, pt able to correct himself  Postural control: Left lateral lean Standing balance support: Bilateral upper extremity supported Standing balance-Leahy Scale: Poor Standing balance comment: relies on BUE support                            Cognition Arousal/Alertness: Awake/alert Behavior During Therapy: WFL for tasks assessed/performed Overall Cognitive Status: Within Functional Limits for tasks assessed                                 General Comments: pt reports STM deficits at baseline      Exercises      General Comments        Pertinent Vitals/Pain Faces Pain Scale: Hurts even more Pain Location: bil feet with wt bearing Pain Descriptors / Indicators: Grimacing Pain Intervention(s): Limited activity within patient's tolerance;Monitored during session;Repositioned    Home Living                      Prior Function  PT Goals (current goals can now be found in the care plan section) Acute Rehab PT Goals Patient Stated Goal: to get more help-pt agreeable to SNF  PT Goal Formulation: With patient Time For Goal Achievement: 09/19/17 Potential to Achieve Goals: Good Progress towards PT goals: Progressing toward goals    Frequency    Min 2X/week      PT Plan Current plan remains appropriate    Co-evaluation              AM-PAC PT "6 Clicks" Daily Activity  Outcome Measure  Difficulty turning over in bed (including adjusting bedclothes, sheets and blankets)?: Unable Difficulty moving from lying on back to sitting on the side of the bed? :  Unable Difficulty sitting down on and standing up from a chair with arms (e.g., wheelchair, bedside commode, etc,.)?: Unable Help needed moving to and from a bed to chair (including a wheelchair)?: A Little Help needed walking in hospital room?: A Lot Help needed climbing 3-5 steps with a railing? : Total 6 Click Score: 9    End of Session Equipment Utilized During Treatment: Gait belt Activity Tolerance: Patient limited by fatigue;Patient limited by pain Patient left: in bed;with call bell/phone within reach;with nursing/sitter in room Nurse Communication: Mobility status PT Visit Diagnosis: History of falling (Z91.81);Repeated falls (R29.6);Difficulty in walking, not elsewhere classified (R26.2);Muscle weakness (generalized) (M62.81);Adult, failure to thrive (R62.7)     Time: 6945-0388 PT Time Calculation (min) (ACUTE ONLY): 27 min  Charges:  $Therapeutic Activity: 23-37 mins                    G Codes:          [name] 09/07/2017, 11:18 AM [phone]

## 2017-09-07 NOTE — Clinical Social Work Placement (Signed)
   Patient has bed at [facility].   LCSW confirmed bed with facility, room 109.   LCSW faxed dc documents to facility.   Patient will transport by [facility].   RN report #: [phone]  BK[facility]  CLINICAL SOCIAL WORK PLACEMENT  NOTE  Date:  09/07/2017  Patient Details  Name: Patrick Holt MRN: [id number] Date of Birth: 1950/03/25  Clinical Social Work is seeking post-discharge placement for this patient at the [place] level of care (*CSW will initial, date and re-position this form in  chart as items are completed):  Yes   Patient/family provided with [place] Work Department's list of facilities offering this level of care within the geographic area requested by the patient (or if unable, by the patient's family).  Yes   Patient/family informed of their freedom to choose among providers that offer the needed level of care, that participate in Medicare, Medicaid or managed care program needed by the patient, have an available bed and are willing to accept the patient.  Yes   Patient/family informed of [facility]'s ownership interest in [facility] and [facility], as well as of the fact that they are under no obligation to receive care at these facilities.  PASRR submitted to EDS on       PASRR number received on 09/06/17     Existing PASRR number confirmed on       FL2 transmitted to all facilities in geographic area requested by pt/family on 09/06/17     FL2 transmitted to all facilities within larger geographic area on       Patient informed that his/her managed care company has contracts with or will negotiate with certain facilities, including the following:        Yes   Patient/family informed of bed offers received.  Patient chooses bed at [facility])     Physician recommends and patient chooses bed at      Patient to be transferred to [facility]) on 09/06/17.  Patient to be transferred to facility by EMS     Patient family notified on 09/06/17 of transfer.  Name of family member notified:        PHYSICIAN Please prepare priority discharge summary, including medications, Please sign FL2     Additional Comment:    _______________________________________________ Servando Snare, LCSW 09/07/2017, 10:30 AM

## 2017-09-07 NOTE — Progress Notes (Signed)
RN called for report at 1225.  Was on hold for 15 minutes without anyone answer the phone and RN cannot left any message. Will try second attempt later.

## 2017-09-07 NOTE — Discharge Summary (Addendum)
Discharge Summary  Patrick Holt:[id number] DOB: 1950/03/28  PCP: [facility]  Admit date: 09/03/2017 Discharge date: 09/07/2017   Time spent: < 25 minutes  Admitted From: home Disposition: SNF  Recommendations for Outpatient Follow-up:  1. Follow up with PCP in 1-2 weeks 2. Prednisone taper(30 mg on 7/25, 20 mg on 7/26, 10 mg on 7/27), to start today and complete on 7/27. Needs rheumatology followup as outpatient 3. Will need oxygen on discharge 4. Continue cefdinir for 5 days starting 7/24    Discharge Diagnoses:  Active Hospital Problems   Diagnosis Date Noted  . Altered mental status 01/13/2017    Resolved Hospital Problems  No resolved problems to display.    Discharge Condition: Stable  CODE STATUS: Full code Diet recommendation: Heart healthy  Vitals:   09/06/17 2019 09/07/17 0445  BP: 122/65 136/64  Pulse: (!) 57 (!) 51  Resp: (!) 2 18  Temp: 99.6 F (37.6 C) 98.3 F (36.8 C)  SpO2: 92% 91%    History of present illness:  Patrick Holt is a 67 y.o. year old male with medical history significant for COPD, HTN, HLD, CAD, AF, bipolar disorder, seizure disorder, dermatomyositis/RA, CVA who presented on 09/03/2017 with several weeks of weakness and body aches.  Prior to admission patient reported several weeks of cough and worsening bilateral lower leg swelling and scaling in addition to worsening weakness he reported consistent with his muscular dystrophy flares.  Remaining hospital course addressed in problem based format below:   Hospital Course:   Acute metabolic encephalopathy, resolved.  Presented with generalized weakness however was found to be very somnolent and easily confused on presentation.  Significant only with treatment of UTI presumed to be mitis.  Other work-up for confusion was unremarkable (negative CT head, within normal limits TSH, HIV antibody, ammonia, ethanol, UDS-benzos).  Alert and oriented x4 on  discharge day  Generalized weakness, presumed related to dermatomyositis flare and UTI.  Patient is weakness improved with treatment of UTI as well as IV Solu-Medrol which was transitioned to oral prednisone with continued improvement.  Physical therapy recommended SNF to continue to work on supervised mobility  UTI.  Urine culture unhelpful get growing multiple species.  Transitioned to Rocephin on 7/22 given allergies to aztreonam.  De-escalated to cefdinir on discharge day, to continue for 5 additional days.  Prerenal AKI.  Creatinine elevated at 1.3 on admission.  Return to 2 baseline of 1-1.2 on discharge day  Chronic COPD, stable.  1.5 L nasal cannula on discharge day.  Continue home meds Symbicort.  A. fib, in normal sinus rhythm.   No anticoagulation with history of subdural hematoma and recurrent falls.  Provider spoke with primary cardiologist on 7/21 (Dr. Harrington Challenger) given some sinus bradycardia and intermittent pauses on telemetry: These were felt to be symptomatic bradycardia/pauses it was recommended to continue home amiodarone at current dose to maintain normal sinus rhythm.  Acute right lower extremity DVT.  Provider spoke with vascular surgeon on-call Dr. Vallarie Mare (09/04/2017) who recommended following the patient with repeat ultrasound in 1 month as outpatient and does not recommend anticoagulation given history of subdural hematoma, multiple falls, and location of DVT or (small below the knee).  Chronic diastolic CHF.  Euvolemic.  Continue home Lasix  CAD, stable.  Continue home aspirin  Hyperlipidemia, stable continue home statin  Consultations:  none  Procedures/Studies: None  Discharge Exam: BP 136/64 (BP Location: Left Arm)   Pulse (!) 51   Temp 98.3  F (36.8 C) (Oral)   Resp 18   Ht 5\' 3"  (1.6 m)   Wt 105.7 kg (233 lb)   SpO2 91%   BMI 41.27 kg/m    General: Lying in bed, no apparent distress Eyes: EOMI, anicteric ENT: Oral Mucosa clear and moist, no  thrush Cardiovascular: regular rate and rhythm, no murmurs, rubs or gallops, no edema, Respiratory: Normal respiratory effort on 1.5 L nasal cannula, lungs clear to auscultation bilaterally Abdomen: soft, non-distended, non-tender, normal bowel sounds Skin: No Rash Neurologic: Move against resistance in bilateral upper extremities, able to lift legs against gravity bilateral lower extremities.Mental status AAOx3, speech normal, Psychiatric:Appropriate affect, and mood   Discharge Instructions You were cared for by a hospitalist during your hospital stay. If you have any questions about your discharge medications or the care you received while you were in the hospital after you are discharged, you can call the unit and asked to speak with the hospitalist on call if the hospitalist that took care of you is not available. Once you are discharged, your primary care physician will handle any further medical issues. Please note that NO REFILLS for any discharge medications will be authorized once you are discharged, as it is imperative that you return to your primary care physician (or establish a relationship with a primary care physician if you do not have one) for your aftercare needs so that they can reassess your need for medications and monitor your lab values.  Discharge Instructions    Diet - low sodium heart healthy   Complete by:  As directed    Increase activity slowly   Complete by:  As directed      Allergies as of 09/07/2017      Reactions   Immune Globulins Other (See Comments)   Acute renal failure   Ciprofloxacin Swelling   Flagyl [metronidazole] Swelling   Lisinopril Diarrhea   Sulfa Antibiotics Other (See Comments)   blisters   Cefepime    Diffuse maculopapular rash when receiving both vanc and cefepime, timing unclear.   Morphine And Related    Does not help with pain   Requip [ropinirole Hcl]    constipation   Vancomycin    Diffuse maculopapular rash when receiving  both vanc/cefepime.  Timing unclear.     Betamethasone Dipropionate Other (See Comments)   Unknown   Bupropion Hcl Other (See Comments)   Unknown   Clobetasol Other (See Comments)   Unknown   Codeine Other (See Comments)   Does not help with pain   Escitalopram Oxalate Other (See Comments)   Unknown   Fluoxetine Hcl Other (See Comments)   Unknown   Meclizine Rash   Paroxetine Other (See Comments)   Unknown   Penicillins Other (See Comments)   Unknown reaction Has patient had a PCN reaction causing immediate rash, facial/tongue/throat swelling, SOB or lightheadedness with hypotension: unknown Has patient had a PCN reaction causing severe rash involving mucus membranes or skin necrosis: unknown Has patient had a PCN reaction that required hospitalization unknown Has patient had a PCN reaction occurring within the last 10 years: unknown If all of the above answers are "NO", then may proceed with Cephalosporin use. Unknown Unknown reactio   Tacrolimus Other (See Comments)   Unknown   Tetanus Toxoid Other (See Comments)   Unknown   Tuberculin Purified Protein Derivative Other (See Comments)   Unknown      Medication List    STOP taking these medications   ALPRAZolam 0.5  MG tablet Commonly known as:  XANAX   ALPRAZolam 1 MG tablet Commonly known as:  XANAX   ibuprofen 200 MG tablet Commonly known as:  ADVIL,MOTRIN   potassium chloride SA 20 MEQ tablet Commonly known as:  K-DUR,KLOR-CON     TAKE these medications   amiodarone 200 MG tablet Commonly known as:  PACERONE Take 1 tablet (200 mg total) by mouth daily.   amLODipine 5 MG tablet Commonly known as:  NORVASC Take 1 tablet (5 mg total) by mouth daily.   aspirin EC 81 MG tablet Take 1 tablet (81 mg total) by mouth daily.   atorvastatin 40 MG tablet Commonly known as:  LIPITOR Take 1 tablet (40 mg total) by mouth daily.   budesonide-formoterol 160-4.5 MCG/ACT inhaler Commonly known as:  SYMBICORT Inhale  2 puffs into the lungs 2 (two) times daily.   cefdinir 300 MG capsule Commonly known as:  OMNICEF Take 1 capsule (300 mg total) by mouth every 12 (twelve) hours. For 5 days   divalproex 250 MG DR tablet Commonly known as:  DEPAKOTE Take 1 tablet (250 mg total) by mouth 3 (three) times daily.   ergocalciferol 50000 units capsule Commonly known as:  VITAMIN D2 Take 1 capsule (50,000 Units total) by mouth once a week.   esomeprazole 40 MG capsule Commonly known as:  NEXIUM Take 1 capsule (40 mg total) by mouth daily.   furosemide 20 MG tablet Commonly known as:  LASIX Take 1 tablet (20 mg total) by mouth daily.   ipratropium-albuterol 0.5-2.5 (3) MG/3ML Soln Commonly known as:  DUONEB Inhale 3 mLs into the lungs every 6 (six) hours as needed. What changed:  reasons to take this   levETIRAcetam 500 MG tablet Commonly known as:  KEPPRA Take 1 tablet (500 mg total) by mouth 2 (two) times daily.   loperamide 2 MG capsule Commonly known as:  IMODIUM Take 1 capsule (2 mg total) by mouth as needed for diarrhea or loose stools.   multivitamin with minerals Tabs tablet Take 1 tablet by mouth daily.   oxybutynin 5 MG tablet Commonly known as:  DITROPAN Take 1 tablet (5 mg total) by mouth 3 (three) times daily.   Potassium Chloride ER 20 MEQ Tbcr Take 20 mEq by mouth daily.   predniSONE 10 MG tablet Commonly known as:  DELTASONE Take 3 tablets (30 mg total) by mouth daily with breakfast. Start taking on:  09/08/2017   predniSONE 20 MG tablet Commonly known as:  DELTASONE Take 1 tablet (20 mg total) by mouth daily with breakfast. Start taking on:  09/09/2017   predniSONE 10 MG tablet Commonly known as:  DELTASONE Take 1 tablet (10 mg total) by mouth daily with breakfast. Start taking on:  09/10/2017   risperiDONE 1 MG tablet Commonly known as:  RISPERDAL Take 1 tablet (1 mg total) by mouth 2 (two) times daily.   senna-docusate 8.6-50 MG tablet Commonly known as:   Senokot-S Take 1 tablet by mouth at bedtime as needed for mild constipation.   traMADol 50 MG tablet Commonly known as:  ULTRAM Take 1 tablet (50 mg total) by mouth every 6 (six) hours as needed for moderate pain.      Allergies  Allergen Reactions  . Immune Globulins Other (See Comments)    Acute renal failure  . Ciprofloxacin Swelling  . Flagyl [Metronidazole] Swelling  . Lisinopril Diarrhea  . Sulfa Antibiotics Other (See Comments)    blisters  . Cefepime     Diffuse  maculopapular rash when receiving both vanc and cefepime, timing unclear.  . Morphine And Related     Does not help with pain  . Requip [Ropinirole Hcl]     constipation  . Vancomycin     Diffuse maculopapular rash when receiving both vanc/cefepime.  Timing unclear.    . Betamethasone Dipropionate Other (See Comments)    Unknown  . Bupropion Hcl Other (See Comments)    Unknown  . Clobetasol Other (See Comments)    Unknown  . Codeine Other (See Comments)    Does not help with pain  . Escitalopram Oxalate Other (See Comments)    Unknown  . Fluoxetine Hcl Other (See Comments)    Unknown  . Meclizine Rash  . Paroxetine Other (See Comments)    Unknown  . Penicillins Other (See Comments)    Unknown reaction Has patient had a PCN reaction causing immediate rash, facial/tongue/throat swelling, SOB or lightheadedness with hypotension: unknown Has patient had a PCN reaction causing severe rash involving mucus membranes or skin necrosis: unknown Has patient had a PCN reaction that required hospitalization unknown Has patient had a PCN reaction occurring within the last 10 years: unknown If all of the above answers are "NO", then may proceed with Cephalosporin use. Unknown Unknown reactio  . Tacrolimus Other (See Comments)    Unknown  . Tetanus Toxoid Other (See Comments)    Unknown  . Tuberculin Purified Protein Derivative Other (See Comments)    Unknown    Contact information for follow-up providers     Conrad , MD Follow up in 4 week(s).   Specialties:  Vascular Surgery, Cardiology Why:  call and make an appointment to be seen within 4 weeks to repeat right lower ext venous doppler.  Contact information: [address] [place] [address] [phone]            Contact information for after-discharge care    Destination    [facility] .   Service:  Skilled Nursing Contact information: [place] [phone]                   The results of significant diagnostics from this hospitalization (including imaging, microbiology, ancillary and laboratory) are listed below for reference.    Significant Diagnostic Studies: Dg Chest 2 View  Result Date: 09/03/2017 CLINICAL DATA:  Generalized weakness.  Hypertension. EXAM: CHEST - 2 VIEW COMPARISON:  Jun 16, 2017 FINDINGS: There is a small pleural effusion on each side with bibasilar atelectatic change. There is no frank edema or consolidation. Heart is upper normal in size with pulmonary vascularity normal. No adenopathy. There is thoracolumbar levoscoliosis. IMPRESSION: Small pleural effusions with bibasilar atelectasis. No frank consolidation. Stable cardiac silhouette. Electronically Signed   By: Lowella Grip III M.D.   On: 09/03/2017 18:21   Ct Head Wo Contrast  Result Date: 09/03/2017 CLINICAL DATA:  67 year old male with altered mental status. EXAM: CT HEAD WITHOUT CONTRAST TECHNIQUE: Contiguous axial images were obtained from the base of the skull through the vertex without intravenous contrast. COMPARISON:  Head CT dated 06/16/2017 FINDINGS: Brain: There is mild age-related atrophy and chronic microvascular ischemic changes. There is no acute intracranial hemorrhage. No mass effect or midline shift noted. No extra-axial fluid collection. Vascular: No hyperdense vessel or unexpected calcification. Skull: Normal. Negative for fracture or focal lesion.  Sinuses/Orbits: No acute finding. Other: None IMPRESSION: 1. No acute intracranial pathology. 2. Age-related atrophy and chronic microvascular ischemic  changes. Electronically Signed   By: Anner Crete M.D.   On: 09/03/2017 21:50    Microbiology: Recent Results (from the past 240 hour(s))  Urine culture     Status: Abnormal   Collection Time: 09/03/17  8:45 PM  Result Value Ref Range Status   Specimen Description   Final    URINE, CLEAN CATCH Performed at [facility] [address], [place], [place] [address]    Special Requests   Final    NONE Performed at [facility], [place] [address] [place], [place] [address]    Culture MULTIPLE SPECIES PRESENT, SUGGEST RECOLLECTION (A)  Final   Report Status 09/05/2017 FINAL  Final     Labs: Basic Metabolic Panel: Recent Labs  Lab 09/03/17 1901 09/04/17 0235 09/05/17 0802 09/06/17 0555  NA 142 141 139 140  K 4.7 4.2 3.7 4.0  CL 107 104 100 102  CO2 27 29 28 30   GLUCOSE 88 78 150* 139*  BUN 14 14 23  28*  CREATININE 1.36* 1.37* 1.44* 1.25*  CALCIUM 8.7* 8.7* 9.0 8.7*  MG  --  1.9  --   --   PHOS  --  3.7  --   --    Liver Function Tests: Recent Labs  Lab 09/03/17 1901 09/04/17 0235  AST 18 12*  ALT 7 7  ALKPHOS 53 57  BILITOT 1.2 0.9  PROT 5.7* 6.0*  ALBUMIN 2.9* 2.9*   No results for input(s): LIPASE, AMYLASE in the last 168 hours. Recent Labs  Lab 09/04/17 0025  AMMONIA 35   CBC: Recent Labs  Lab 09/03/17 1901 09/04/17 0626 09/05/17 0802  WBC 5.2 5.1 11.3*  NEUTROABS 2.3  --   --   HGB 11.9* 12.5* 13.7  HCT 38.0* 40.2 42.2  MCV 85.4 85.9 84.2  PLT 244 242 298   Cardiac Enzymes: Recent Labs  Lab 09/03/17 1901 09/04/17 0235 09/04/17 0827 09/04/17 1310  CKTOTAL 75  --   --   --   TROPONINI  --  <0.03 <0.03 <0.03   BNP: BNP (last 3 results) Recent Labs    03/03/17 1709 04/06/17 1109 09/03/17 1901  BNP 77.2 55.2 36.1    ProBNP (last 3 results) No  results for input(s): PROBNP in the last 8760 hours.  CBG: No results for input(s): GLUCAP in the last 168 hours.     Signed:  Desiree Hane, MD [facility] 09/07/2017, 12:47 PM

## 2017-09-17 ENCOUNTER — Observation Stay (HOSPITAL_COMMUNITY)
Admission: EM | Admit: 2017-09-17 | Discharge: 2017-09-20 | Disposition: A | Discharge status: Skilled Nursing Facility | Payer: Medicare Other | Attending: Family Medicine | Admitting: Family Medicine

## 2017-09-17 ENCOUNTER — Other Ambulatory Visit: Payer: Self-pay

## 2017-09-17 ENCOUNTER — Encounter (HOSPITAL_COMMUNITY): Payer: Self-pay

## 2017-09-17 ENCOUNTER — Emergency Department (HOSPITAL_COMMUNITY): Payer: Medicare Other

## 2017-09-17 DIAGNOSIS — M3390 Dermatopolymyositis, unspecified, organ involvement unspecified: Secondary | ICD-10-CM | POA: Insufficient documentation

## 2017-09-17 DIAGNOSIS — G40909 Epilepsy, unspecified, not intractable, without status epilepticus: Secondary | ICD-10-CM

## 2017-09-17 DIAGNOSIS — I259 Chronic ischemic heart disease, unspecified: Secondary | ICD-10-CM | POA: Insufficient documentation

## 2017-09-17 DIAGNOSIS — Z882 Allergy status to sulfonamides status: Secondary | ICD-10-CM | POA: Insufficient documentation

## 2017-09-17 DIAGNOSIS — R001 Bradycardia, unspecified: Secondary | ICD-10-CM | POA: Insufficient documentation

## 2017-09-17 DIAGNOSIS — I110 Hypertensive heart disease with heart failure: Secondary | ICD-10-CM | POA: Diagnosis not present

## 2017-09-17 DIAGNOSIS — J449 Chronic obstructive pulmonary disease, unspecified: Secondary | ICD-10-CM | POA: Diagnosis present

## 2017-09-17 DIAGNOSIS — F209 Schizophrenia, unspecified: Secondary | ICD-10-CM | POA: Insufficient documentation

## 2017-09-17 DIAGNOSIS — M6282 Rhabdomyolysis: Secondary | ICD-10-CM | POA: Diagnosis not present

## 2017-09-17 DIAGNOSIS — I7300 Raynaud's syndrome without gangrene: Secondary | ICD-10-CM | POA: Insufficient documentation

## 2017-09-17 DIAGNOSIS — E1140 Type 2 diabetes mellitus with diabetic neuropathy, unspecified: Secondary | ICD-10-CM | POA: Insufficient documentation

## 2017-09-17 DIAGNOSIS — F17210 Nicotine dependence, cigarettes, uncomplicated: Secondary | ICD-10-CM | POA: Insufficient documentation

## 2017-09-17 DIAGNOSIS — E876 Hypokalemia: Secondary | ICD-10-CM | POA: Insufficient documentation

## 2017-09-17 DIAGNOSIS — M797 Fibromyalgia: Secondary | ICD-10-CM | POA: Insufficient documentation

## 2017-09-17 DIAGNOSIS — K219 Gastro-esophageal reflux disease without esophagitis: Secondary | ICD-10-CM | POA: Diagnosis not present

## 2017-09-17 DIAGNOSIS — D869 Sarcoidosis, unspecified: Secondary | ICD-10-CM | POA: Diagnosis not present

## 2017-09-17 DIAGNOSIS — I959 Hypotension, unspecified: Secondary | ICD-10-CM | POA: Diagnosis not present

## 2017-09-17 DIAGNOSIS — I10 Essential (primary) hypertension: Secondary | ICD-10-CM | POA: Diagnosis present

## 2017-09-17 DIAGNOSIS — R296 Repeated falls: Secondary | ICD-10-CM

## 2017-09-17 DIAGNOSIS — E860 Dehydration: Secondary | ICD-10-CM | POA: Diagnosis not present

## 2017-09-17 DIAGNOSIS — Z887 Allergy status to serum and vaccine status: Secondary | ICD-10-CM | POA: Insufficient documentation

## 2017-09-17 DIAGNOSIS — N170 Acute kidney failure with tubular necrosis: Secondary | ICD-10-CM

## 2017-09-17 DIAGNOSIS — I480 Paroxysmal atrial fibrillation: Secondary | ICD-10-CM | POA: Diagnosis not present

## 2017-09-17 DIAGNOSIS — D649 Anemia, unspecified: Secondary | ICD-10-CM | POA: Diagnosis not present

## 2017-09-17 DIAGNOSIS — Z888 Allergy status to other drugs, medicaments and biological substances status: Secondary | ICD-10-CM | POA: Insufficient documentation

## 2017-09-17 DIAGNOSIS — Z885 Allergy status to narcotic agent status: Secondary | ICD-10-CM | POA: Insufficient documentation

## 2017-09-17 DIAGNOSIS — G9341 Metabolic encephalopathy: Secondary | ICD-10-CM | POA: Diagnosis not present

## 2017-09-17 DIAGNOSIS — Z881 Allergy status to other antibiotic agents status: Secondary | ICD-10-CM | POA: Insufficient documentation

## 2017-09-17 DIAGNOSIS — Z8719 Personal history of other diseases of the digestive system: Secondary | ICD-10-CM | POA: Insufficient documentation

## 2017-09-17 DIAGNOSIS — M069 Rheumatoid arthritis, unspecified: Secondary | ICD-10-CM | POA: Diagnosis not present

## 2017-09-17 DIAGNOSIS — N179 Acute kidney failure, unspecified: Principal | ICD-10-CM | POA: Diagnosis present

## 2017-09-17 DIAGNOSIS — R627 Adult failure to thrive: Secondary | ICD-10-CM | POA: Insufficient documentation

## 2017-09-17 DIAGNOSIS — I252 Old myocardial infarction: Secondary | ICD-10-CM | POA: Insufficient documentation

## 2017-09-17 DIAGNOSIS — Z6841 Body Mass Index (BMI) 40.0 and over, adult: Secondary | ICD-10-CM | POA: Insufficient documentation

## 2017-09-17 DIAGNOSIS — M359 Systemic involvement of connective tissue, unspecified: Secondary | ICD-10-CM | POA: Insufficient documentation

## 2017-09-17 DIAGNOSIS — Z880 Allergy status to penicillin: Secondary | ICD-10-CM | POA: Insufficient documentation

## 2017-09-17 DIAGNOSIS — J9611 Chronic respiratory failure with hypoxia: Secondary | ICD-10-CM | POA: Insufficient documentation

## 2017-09-17 DIAGNOSIS — I2721 Secondary pulmonary arterial hypertension: Secondary | ICD-10-CM | POA: Diagnosis present

## 2017-09-17 DIAGNOSIS — I5032 Chronic diastolic (congestive) heart failure: Secondary | ICD-10-CM | POA: Insufficient documentation

## 2017-09-17 DIAGNOSIS — Z720 Tobacco use: Secondary | ICD-10-CM | POA: Diagnosis present

## 2017-09-17 DIAGNOSIS — M3313 Other dermatomyositis without myopathy: Secondary | ICD-10-CM | POA: Diagnosis present

## 2017-09-17 DIAGNOSIS — Z8669 Personal history of other diseases of the nervous system and sense organs: Secondary | ICD-10-CM

## 2017-09-17 DIAGNOSIS — F429 Obsessive-compulsive disorder, unspecified: Secondary | ICD-10-CM | POA: Diagnosis not present

## 2017-09-17 DIAGNOSIS — Z7982 Long term (current) use of aspirin: Secondary | ICD-10-CM | POA: Insufficient documentation

## 2017-09-17 DIAGNOSIS — W19XXXA Unspecified fall, initial encounter: Secondary | ICD-10-CM

## 2017-09-17 DIAGNOSIS — Z79899 Other long term (current) drug therapy: Secondary | ICD-10-CM | POA: Insufficient documentation

## 2017-09-17 DIAGNOSIS — F319 Bipolar disorder, unspecified: Secondary | ICD-10-CM | POA: Insufficient documentation

## 2017-09-17 DIAGNOSIS — J410 Simple chronic bronchitis: Secondary | ICD-10-CM | POA: Diagnosis not present

## 2017-09-17 DIAGNOSIS — S0083XA Contusion of other part of head, initial encounter: Secondary | ICD-10-CM

## 2017-09-17 DIAGNOSIS — Z86718 Personal history of other venous thrombosis and embolism: Secondary | ICD-10-CM | POA: Insufficient documentation

## 2017-09-17 DIAGNOSIS — E870 Hyperosmolality and hypernatremia: Secondary | ICD-10-CM | POA: Insufficient documentation

## 2017-09-17 DIAGNOSIS — E785 Hyperlipidemia, unspecified: Secondary | ICD-10-CM | POA: Insufficient documentation

## 2017-09-17 DIAGNOSIS — I872 Venous insufficiency (chronic) (peripheral): Secondary | ICD-10-CM | POA: Insufficient documentation

## 2017-09-17 LAB — GLUCOSE, CAPILLARY
GLUCOSE-CAPILLARY: 66 mg/dL — AB (ref 70–99)
GLUCOSE-CAPILLARY: 95 mg/dL (ref 70–99)

## 2017-09-17 LAB — CBC WITH DIFFERENTIAL/PLATELET
Basophils Absolute: 0 10*3/uL (ref 0.0–0.1)
Basophils Relative: 0 %
Eosinophils Absolute: 0.1 10*3/uL (ref 0.0–0.7)
Eosinophils Relative: 1 %
HCT: 42.5 % (ref 39.0–52.0)
Hemoglobin: 13 g/dL (ref 13.0–17.0)
Lymphocytes Relative: 28 %
Lymphs Abs: 2.4 10*3/uL (ref 0.7–4.0)
MCH: 26.9 pg (ref 26.0–34.0)
MCHC: 30.6 g/dL (ref 30.0–36.0)
MCV: 88 fL (ref 78.0–100.0)
Monocytes Absolute: 0.9 10*3/uL (ref 0.1–1.0)
Monocytes Relative: 10 %
Neutro Abs: 5.3 10*3/uL (ref 1.7–7.7)
Neutrophils Relative %: 61 %
Platelets: 221 10*3/uL (ref 150–400)
RBC: 4.83 MIL/uL (ref 4.22–5.81)
RDW: 21.1 % — ABNORMAL HIGH (ref 11.5–15.5)
WBC: 8.7 10*3/uL (ref 4.0–10.5)

## 2017-09-17 LAB — BASIC METABOLIC PANEL
Anion gap: 10 (ref 5–15)
BUN: 25 mg/dL — ABNORMAL HIGH (ref 8–23)
CO2: 28 mmol/L (ref 22–32)
Calcium: 8.9 mg/dL (ref 8.9–10.3)
Chloride: 105 mmol/L (ref 98–111)
Creatinine, Ser: 1.76 mg/dL — ABNORMAL HIGH (ref 0.61–1.24)
GFR calc Af Amer: 44 mL/min — ABNORMAL LOW (ref >60)
GFR calc non Af Amer: 38 mL/min — ABNORMAL LOW (ref >60)
Glucose, Bld: 82 mg/dL (ref 70–99)
Potassium: 3.9 mmol/L (ref 3.5–5.1)
Sodium: 143 mmol/L (ref 135–145)

## 2017-09-17 LAB — CBG MONITORING, ED: Glucose-Capillary: 228 mg/dL — ABNORMAL HIGH (ref 70–99)

## 2017-09-17 LAB — CK: Total CK: 550 U/L — ABNORMAL HIGH (ref 49–397)

## 2017-09-17 LAB — MAGNESIUM: Magnesium: 2 mg/dL (ref 1.7–2.4)

## 2017-09-17 LAB — TSH: TSH: 0.756 u[IU]/mL (ref 0.350–4.500)

## 2017-09-17 LAB — BRAIN NATRIURETIC PEPTIDE: B Natriuretic Peptide: 33.3 pg/mL (ref 0.0–100.0)

## 2017-09-17 LAB — TROPONIN I: Troponin I: <0.03 ng/mL (ref <0.03)

## 2017-09-17 MED ORDER — RISPERIDONE 1 MG PO TABS
1.0000 mg | ORAL_TABLET | Freq: Two times a day (BID) | ORAL | Status: DC
  Administered 2017-09-17 – 2017-09-20 (×6): 1 mg via ORAL
  Filled 2017-09-17 (×7): qty 1

## 2017-09-17 MED ORDER — PANTOPRAZOLE SODIUM 40 MG PO TBEC
40.0000 mg | DELAYED_RELEASE_TABLET | Freq: Every day | ORAL | Status: DC
  Administered 2017-09-18 – 2017-09-20 (×3): 40 mg via ORAL
  Filled 2017-09-17 (×3): qty 1

## 2017-09-17 MED ORDER — LACTATED RINGERS IV SOLN: INTRAVENOUS | Status: DC

## 2017-09-17 MED ORDER — SODIUM CHLORIDE 0.9 % IV BOLUS
1000.0000 mL | Freq: Once | INTRAVENOUS | Status: AC
  Administered 2017-09-17: 1000 mL via INTRAVENOUS

## 2017-09-17 MED ORDER — LACTATED RINGERS IV SOLN
INTRAVENOUS | Status: AC
  Administered 2017-09-17 – 2017-09-18 (×2): via INTRAVENOUS

## 2017-09-17 MED ORDER — ASPIRIN EC 81 MG PO TBEC
81.0000 mg | DELAYED_RELEASE_TABLET | Freq: Every day | ORAL | Status: DC
  Administered 2017-09-18 – 2017-09-20 (×3): 81 mg via ORAL
  Filled 2017-09-17 (×3): qty 1

## 2017-09-17 MED ORDER — TRAMADOL HCL 50 MG PO TABS: 50.0000 mg | ORAL_TABLET | Freq: Four times a day (QID) | ORAL | Status: DC | PRN

## 2017-09-17 MED ORDER — PREDNISONE 20 MG PO TABS
20.0000 mg | ORAL_TABLET | Freq: Every day | ORAL | Status: DC
  Administered 2017-09-18 – 2017-09-19 (×2): 20 mg via ORAL
  Filled 2017-09-17 (×2): qty 1

## 2017-09-17 MED ORDER — LEVETIRACETAM 500 MG PO TABS
500.0000 mg | ORAL_TABLET | Freq: Two times a day (BID) | ORAL | Status: DC
  Administered 2017-09-17 – 2017-09-20 (×6): 500 mg via ORAL
  Filled 2017-09-17 (×6): qty 1

## 2017-09-17 MED ORDER — MOMETASONE FURO-FORMOTEROL FUM 200-5 MCG/ACT IN AERO
2.0000 | INHALATION_SPRAY | Freq: Two times a day (BID) | RESPIRATORY_TRACT | Status: DC
  Administered 2017-09-17 – 2017-09-20 (×7): 2 via RESPIRATORY_TRACT
  Filled 2017-09-17: qty 8.8

## 2017-09-17 MED ORDER — ATORVASTATIN CALCIUM 20 MG PO TABS
40.0000 mg | ORAL_TABLET | Freq: Every day | ORAL | Status: DC
  Administered 2017-09-18 – 2017-09-20 (×3): 40 mg via ORAL
  Filled 2017-09-17 (×3): qty 2

## 2017-09-17 MED ORDER — AMIODARONE HCL 200 MG PO TABS
200.0000 mg | ORAL_TABLET | Freq: Every day | ORAL | Status: DC
  Administered 2017-09-18 – 2017-09-20 (×3): 200 mg via ORAL
  Filled 2017-09-17 (×3): qty 1

## 2017-09-17 MED ORDER — VITAMIN D (ERGOCALCIFEROL) 1.25 MG (50000 UNIT) PO CAPS
50000.0000 [IU] | ORAL_CAPSULE | ORAL | Status: DC
Start: 2017-09-17 — End: 2017-09-20
  Filled 2017-09-17: qty 1

## 2017-09-17 MED ORDER — BISACODYL 10 MG RE SUPP: 10.0000 mg | Freq: Every day | RECTAL | Status: DC | PRN

## 2017-09-17 MED ORDER — HEPARIN SODIUM (PORCINE) 5000 UNIT/ML IJ SOLN
5000.0000 [IU] | Freq: Three times a day (TID) | INTRAMUSCULAR | Status: DC
  Administered 2017-09-17 – 2017-09-20 (×8): 5000 [IU] via SUBCUTANEOUS
  Filled 2017-09-17 (×8): qty 1

## 2017-09-17 MED ORDER — DIVALPROEX SODIUM 250 MG PO DR TAB
250.0000 mg | DELAYED_RELEASE_TABLET | Freq: Three times a day (TID) | ORAL | Status: DC
  Administered 2017-09-17 – 2017-09-20 (×9): 250 mg via ORAL
  Filled 2017-09-17 (×10): qty 1

## 2017-09-17 NOTE — ED Provider Notes (Signed)
[facility] EMERGENCY DEPARTMENT Provider Note   CSN: [id number] Arrival date & time: 09/17/17  1111     History   Chief Complaint Chief Complaint  Patient presents with  . Fall    HPI Patrick Holt is a 67 y.o. male.  HPI   67 year old male with generalized weakness.  Patient reports that he fell around 10 PM yesterday and he was unable to get up secondary to generalized weakness.  He states that he has a past history of dermatomyositis and he feels generally weak at baseline.  Of note, patient was just recently admitted under very cerebellar circumstances.  He was discharged to skilled nursing facility.  He states he only stayed 3 days then signed himself out because he did not feel like he was being treated appropriately.  He is living at home alone.  Denies any acute pain to me.  He does have evidence of some head trauma with some abrasions across his forehead and eyebrow.  Past Medical History:  Diagnosis Date  . Anemia   . Anxiety   . Asthma   . Atrial fibrillation ([place]) 01/2012   with RVR  . Bipolar 1 disorder ([place])   . Cancer ([place])   . Collagen vascular disease ([place])   . Conversion disorder 06/2010  . COPD (chronic obstructive pulmonary disease) ([place])   . Dermatomyositis ([place])   . Dermatomyositis ([place])   . Diverticulitis   . Diverticulosis of colon   . Dysrhythmia    "irregular" (11/15/2012)  . Esophageal dysmotility   . Esophageal stricture   . Fibromyalgia   . Gastritis   . GERD (gastroesophageal reflux disease)   . Hand fracture, right    sept 13, 2017, still wearing splint as og 01-10-2017  . Headache(784.0)    "severe; get them often" (11/15/2012)  . Hiatal hernia   . History of narcotic addiction ([place])   . Hx of adenomatous colonic polyps   . Hyperlipidemia   . Hypertension   . Internal hemorrhoids   . Ischemic heart disease   . Major depression    with acute psychotic break in 06/2010  . Myocardial infarct ([place])    mulitple  (1999, 2000, 2004)  . Narcotic dependence ([place])   . Nephrolithiasis   . Obesity   . OCD (obsessive compulsive disorder)   . Otosclerosis   . Paroxysmal A-fib ([place])   . Peripheral neuropathy   . Raynaud's disease   . Renal insufficiency   . Rheumatoid arthritis(714.0)   . Sarcoidosis   . Seizures ([place])   . Subarachnoid hemorrhage ([place]) 01/2012   with subdural  hematoma.   . Type II diabetes mellitus ([place])   . Urge incontinence   . Vertigo     Patient Active Problem List   Diagnosis Date Noted  . Unstable angina ([place]) 06/16/2017  . L1 and L2 vertebral fracture ([place]) 04/06/2017  . Multiple rib fractures 04/06/2017  . Evaluation by psychiatric service required 03/07/2017  . Acute metabolic encephalopathy 32/95/1884  . Hypoglycemia 03/04/2017  . Acute respiratory failure with hypoxia ([place]) 07-01-2017  . Acute encephalopathy 07-01-2017  . Rhabdomyolysis 02/05/2017  . Acute respiratory failure with hypoxia and hypercapnia ([place]) 02/05/2017  . COPD with acute exacerbation ([place]) 02/05/2017  . Bipolar 1 disorder ([place]) 02/05/2017  . Anemia 02/05/2017  . Hypertension 02/05/2017  . Hand fracture, right 02/05/2017  . Narcotic dependence ([place]) 02/05/2017  . Pre-diabetes 02/05/2017  . PAF (paroxysmal atrial fibrillation) ([place]) 02/05/2017  . Frequent  falls 02/05/2017  . History of dermatomyositis 02/05/2017  . Seizure disorder ([place]) 02/05/2017  . Bilateral lower extremity edema 02/05/2017  . Pulmonary artery hypertension ([place]) 01/15/2017  . Altered mental status 01/13/2017  . Low oxygen saturation   . Pain in right wrist 02/27/2016  . Altered mental state 11/14/2015  . Severe dehydration 11/14/2015  . Hypernatremia 11/14/2015  . Difficulty in walking, not elsewhere classified   . Right wrist fracture   . Closed displaced fracture of ulnar styloid with routine healing   . Scapholunate ligament injury with no instability   . Falls frequently   . Hypoxia 11/02/2015  . Anxiety  06/06/2015  . Impaired mobility 04/28/2015  . Lumbar spondylosis with myelopathy 03/07/2015  . Nocturia 12/03/2014  . Chronic venous insufficiency 09/23/2014  . Poor social situation 07/30/2014  . Schizophrenia, acute ([place])   . Schizoaffective disorder, bipolar type ([place])   . Decreased visual acuity 06/27/2014  . COPD exacerbation ([place])   . Right sided weakness 01/04/2014  . Essential hypertension, benign   . Atrial fibrillation, unspecified   . Radiculopathy of cervical region   . Polyuria 12/07/2013  . Erectile dysfunction 12/07/2013  . Diverticulitis 08/01/2013  . Acute diverticulitis 08/01/2013  . Hematochezia 06/04/2013  . Hypokalemia 10/03/2012  . Benign neoplasm of colon 09/27/2012  . Stricture and stenosis of esophagus 09/27/2012  . Diverticulitis of colon without hemorrhage 07/26/2012  . Polypharmacy 03/26/2012  . Seizures ([place]) 02/10/2012  . Atrial fibrillation ([place]) 02/05/2012  . Coronary atherosclerosis of native coronary artery 02/05/2012  . Radicular low back pain 03/09/2011  . History of seizure disorder 01/29/2011  . Irritable bowel syndrome (IBS) 06/24/2010  . Urge incontinence 06/19/2010  . Tobacco abuse 11/23/2008  . DEPRESSION, MAJOR 05/31/2008  . ISCHEMIC HEART DISEASE 05/31/2008  . RAYNAUD'S DISEASE 05/31/2008  . HIATAL HERNIA 05/31/2008  . Rheumatoid arthritis ([place]) 05/31/2008  . FIBROMYALGIA 05/31/2008  . Hyperlipidemia 01/26/2008  . Anxiety state 01/26/2008  . HEMORRHOIDS, INTERNAL 01/26/2008  . COPD (chronic obstructive pulmonary disease) ([place]) 01/26/2008  . ESOPHAGEAL MOTILITY DISORDER 01/26/2008  . GERD 01/26/2008  . Dermatomyositis ([place]) 01/26/2008    Past Surgical History:  Procedure Laterality Date  . BACK SURGERY    . CARDIAC CATHETERIZATION    . CARPAL TUNNEL RELEASE Bilateral   . CATARACT EXTRACTION W/ INTRAOCULAR LENS IMPLANT Left   . COLONOSCOPY N/A 09/27/2012   Procedure: COLONOSCOPY;  Surgeon: Lafayette Dragon, MD;  Location: [facility]  ENDOSCOPY;  Service: Endoscopy;  Laterality: N/A;  . ESOPHAGOGASTRODUODENOSCOPY N/A 09/27/2012   Procedure: ESOPHAGOGASTRODUODENOSCOPY (EGD);  Surgeon: Lafayette Dragon, MD;  Location: [facility];  Service: Endoscopy;  Laterality: N/A;  . KNEE ARTHROSCOPY W/ MENISCAL REPAIR Left 2009  . LUMBAR DISC SURGERY    . LYMPH NODE DISSECTION Right 1970's   "neck; dr thought I had Hodgkins; turned out to be sarcoidosis" (11/15/2012)  . squamous papilloma   2010   removed by Dr. Constance Holster ENT, noted on tongue  . TONSILLECTOMY          Home Medications    Prior to Admission medications   Medication Sig Start Date End Date Taking? Authorizing Provider  amiodarone (PACERONE) 200 MG tablet Take 1 tablet (200 mg total) by mouth daily. 09/07/17   Oretha Milch D, MD  amLODipine (NORVASC) 5 MG tablet Take 1 tablet (5 mg total) by mouth daily. 09/07/17   Oretha Milch D, MD  aspirin EC 81 MG tablet Take 1 tablet (81 mg total) by mouth daily.  09/07/17   Desiree Hane, MD  atorvastatin (LIPITOR) 40 MG tablet Take 1 tablet (40 mg total) by mouth daily. 09/07/17   Desiree Hane, MD  budesonide-formoterol (SYMBICORT) 160-4.5 MCG/ACT inhaler Inhale 2 puffs into the lungs 2 (two) times daily. 09/07/17   Oretha Milch D, MD  cefdinir (OMNICEF) 300 MG capsule Take 1 capsule (300 mg total) by mouth every 12 (twelve) hours. For 5 days 09/07/17   Desiree Hane, MD  divalproex (DEPAKOTE) 250 MG DR tablet Take 1 tablet (250 mg total) by mouth 3 (three) times daily. 09/07/17   Oretha Milch D, MD  ergocalciferol (VITAMIN D2) 50000 units capsule Take 1 capsule (50,000 Units total) by mouth once a week. 09/07/17   Desiree Hane, MD  esomeprazole (NEXIUM) 40 MG capsule Take 1 capsule (40 mg total) by mouth daily. 09/07/17   Desiree Hane, MD  furosemide (LASIX) 20 MG tablet Take 1 tablet (20 mg total) by mouth daily. 09/07/17   Oretha Milch D, MD  ipratropium-albuterol (DUONEB) 0.5-2.5 (3) MG/3ML SOLN Inhale 3 mLs into  the lungs every 6 (six) hours as needed. 09/07/17   Desiree Hane, MD  levETIRAcetam (KEPPRA) 500 MG tablet Take 1 tablet (500 mg total) by mouth 2 (two) times daily. 09/07/17   Desiree Hane, MD  loperamide (IMODIUM) 2 MG capsule Take 1 capsule (2 mg total) by mouth as needed for diarrhea or loose stools. 09/07/17   Desiree Hane, MD  Multiple Vitamin (MULTIVITAMIN WITH MINERALS) TABS tablet Take 1 tablet by mouth daily. 09/07/17   Desiree Hane, MD  oxybutynin (DITROPAN) 5 MG tablet Take 1 tablet (5 mg total) by mouth 3 (three) times daily. 09/07/17   Oretha Milch D, MD  Potassium Chloride ER 20 MEQ TBCR Take 20 mEq by mouth daily. 09/07/17   Desiree Hane, MD  predniSONE (DELTASONE) 10 MG tablet Take 3 tablets (30 mg total) by mouth daily with breakfast. 09/08/17   Oretha Milch D, MD  predniSONE (DELTASONE) 10 MG tablet Take 1 tablet (10 mg total) by mouth daily with breakfast. 09/10/17   Oretha Milch D, MD  predniSONE (DELTASONE) 20 MG tablet Take 1 tablet (20 mg total) by mouth daily with breakfast. 09/09/17   Oretha Milch D, MD  risperiDONE (RISPERDAL) 1 MG tablet Take 1 tablet (1 mg total) by mouth 2 (two) times daily. 09/07/17   Desiree Hane, MD  senna-docusate (SENOKOT-S) 8.6-50 MG tablet Take 1 tablet by mouth at bedtime as needed for mild constipation. 09/07/17   Desiree Hane, MD  traMADol (ULTRAM) 50 MG tablet Take 1 tablet (50 mg total) by mouth every 6 (six) hours as needed for moderate pain. 09/07/17   Desiree Hane, MD    Family History Family History  Problem Relation Age of Onset  . Alcohol abuse Mother   . Depression Mother   . Heart disease Mother   . Diabetes Mother   . Stroke Mother   . Coronary artery disease Father   . Diabetes Other        1/2 brother  . Hepatitis Brother     Social History Social History   Tobacco Use  . Smoking status: Light Tobacco Smoker    Packs/day: 0.50    Years: 30.00    Pack years: 15.00    Types: Cigarettes      Last attempt to quit: 12/15/2014    Years since quitting: 2.7  . Smokeless tobacco: Never Used  .  Tobacco comment: 1/2 cig BID  Substance Use Topics  . Alcohol use: No    Alcohol/week: 0.0 oz    Comment: Alcohol stopped in September of 2014  . Drug use: No     Allergies   Immune globulins; Ciprofloxacin; Flagyl [metronidazole]; Lisinopril; Sulfa antibiotics; Cefepime; Morphine and related; Requip [ropinirole hcl]; Vancomycin; Betamethasone dipropionate; Bupropion hcl; Clobetasol; Codeine; Escitalopram oxalate; Fluoxetine hcl; Meclizine; Paroxetine; Penicillins; Tacrolimus; Tetanus toxoid; and Tuberculin purified protein derivative   Review of Systems Review of Systems  All systems reviewed and negative, other than as noted in HPI.  Physical Exam Updated Vital Signs BP 101/60   Pulse 66   Temp 98.5 F (36.9 C) (Oral)   Resp 13   SpO2 98%   Physical Exam  Gen: Laying in bed. NAD. Appears to be sleeping. HEENT: abrasions to forehead. Eyes: PERRL Lungs: Coarse breath sounds b/l.  Heart : RRR.  Abdomen: Soft. NT. MSK: No midline spinal tenderness. No bony tenderness of extremities.  Neuro: Speech is slow and deliberate but is answering questions appropriately. CN 2-12 intact. Globally weak. No focal motor deficits.  Psych: flat affect.   ED Treatments / Results  Labs (all labs ordered are listed, but only abnormal results are displayed) Labs Reviewed  CBC WITH DIFFERENTIAL/PLATELET - Abnormal; Notable for the following components:      Result Value   RDW 21.1 (*)    All other components within normal limits  BASIC METABOLIC PANEL - Abnormal; Notable for the following components:   BUN 25 (*)    Creatinine, Ser 1.76 (*)    GFR calc non Af Amer 38 (*)    GFR calc Af Amer 44 (*)    All other components within normal limits  CK - Abnormal; Notable for the following components:   Total CK 550 (*)    All other components within normal limits  CBG MONITORING, ED -  Abnormal; Notable for the following components:   Glucose-Capillary 228 (*)    All other components within normal limits  MAGNESIUM  BRAIN NATRIURETIC PEPTIDE  TROPONIN I  URINALYSIS, ROUTINE W REFLEX MICROSCOPIC    EKG EKG Interpretation  Date/Time:  Saturday September 17 2017 11:37:42 EDT Ventricular Rate:  70 PR Interval:    QRS Duration: 87 QT Interval:  404 QTC Calculation: 436 R Axis:   2 Text Interpretation:  Sinus rhythm Borderline T abnormalities, inferior leads Confirmed by Virgel Manifold [phone]) on 09/17/2017 4:35:39 PM   Radiology Ct Head Wo Contrast  Result Date: 09/17/2017 CLINICAL DATA:  Patient fell last evening, now with lethargy. History of stroke. EXAM: CT HEAD WITHOUT CONTRAST CT CERVICAL SPINE WITHOUT CONTRAST TECHNIQUE: Multidetector CT imaging of the head and cervical spine was performed following the standard protocol without intravenous contrast. Multiplanar CT image reconstructions of the cervical spine were also generated. COMPARISON:  Head CT - 09/03/2017; head and C-spine CT-06/16/2017 FINDINGS: CT HEAD FINDINGS Brain: Moderately advanced atrophy with sulcal prominence and centralized volume loss with commensurate ex vacuo dilatation the ventricular system. Periventricular hypodensities compatible microvascular ischemic disease. Given background parenchymal abnormalities, there is no CT evidence superimposed acute large territory infarct. No intraparenchymal or extra-axial mass or hemorrhage. Unchanged size and configuration of the ventricles and the basilar cisterns. No midline shift. Vascular: Intracranial atherosclerosis. Skull: No displaced calvarial fracture. Sinuses/Orbits: Limited visualization of the paranasal sinuses and mastoid air cells is normal. No air-fluid levels. A small amount of debris is seen within the bilateral external auditory canals. Post left-sided cataract  surgery. Other: Regional soft tissues appear normal. CT CERVICAL SPINE FINDINGS  Alignment: C1 to the superior endplate of T2 is imaged. Mild scoliotic curvature of the cervicothoracic spine, potentially positional. Otherwise, normal alignment of the cervical spine. No anterolisthesis or retrolisthesis. Bilateral facets are normally aligned. Skull base and vertebrae: The dens is normally positioned between the lateral masses of C1. Mild degenerative change of the atlantodental articulation. Normal atlantoaxial articulations. Cervical vertebral body heights are preserved. Old fracture involving the T1 spinous process (sagittal image 18, series 10) Soft tissues and spinal canal: Prevertebral soft tissues are normal. Disc levels: Cervical intervertebral disc space heights appear preserved. Upper chest: Limited visualization of lung apices demonstrates centrilobular emphysematous change. Other: Regional soft tissues appear normal. Calcified atherosclerotic plaque within the bilateral carotid bulbs. Normal noncontrast appearance of the thyroid gland. No bulky cervical lymphadenopathy on this noncontrast examination. IMPRESSION: 1. Similar findings of atrophy and microvascular ischemic disease without superimposed acute intracranial process. 2. No acute fracture or static subluxation of the cervical spine. 3. Old T1 spinous process fracture. Electronically Signed   By: Sandi Mariscal M.D.   On: 09/17/2017 13:25   Ct Cervical Spine Wo Contrast  Result Date: 09/17/2017 CLINICAL DATA:  Patient fell last evening, now with lethargy. History of stroke. EXAM: CT HEAD WITHOUT CONTRAST CT CERVICAL SPINE WITHOUT CONTRAST TECHNIQUE: Multidetector CT imaging of the head and cervical spine was performed following the standard protocol without intravenous contrast. Multiplanar CT image reconstructions of the cervical spine were also generated. COMPARISON:  Head CT - 09/03/2017; head and C-spine CT-06/16/2017 FINDINGS: CT HEAD FINDINGS Brain: Moderately advanced atrophy with sulcal prominence and centralized volume  loss with commensurate ex vacuo dilatation the ventricular system. Periventricular hypodensities compatible microvascular ischemic disease. Given background parenchymal abnormalities, there is no CT evidence superimposed acute large territory infarct. No intraparenchymal or extra-axial mass or hemorrhage. Unchanged size and configuration of the ventricles and the basilar cisterns. No midline shift. Vascular: Intracranial atherosclerosis. Skull: No displaced calvarial fracture. Sinuses/Orbits: Limited visualization of the paranasal sinuses and mastoid air cells is normal. No air-fluid levels. A small amount of debris is seen within the bilateral external auditory canals. Post left-sided cataract surgery. Other: Regional soft tissues appear normal. CT CERVICAL SPINE FINDINGS Alignment: C1 to the superior endplate of T2 is imaged. Mild scoliotic curvature of the cervicothoracic spine, potentially positional. Otherwise, normal alignment of the cervical spine. No anterolisthesis or retrolisthesis. Bilateral facets are normally aligned. Skull base and vertebrae: The dens is normally positioned between the lateral masses of C1. Mild degenerative change of the atlantodental articulation. Normal atlantoaxial articulations. Cervical vertebral body heights are preserved. Old fracture involving the T1 spinous process (sagittal image 18, series 10) Soft tissues and spinal canal: Prevertebral soft tissues are normal. Disc levels: Cervical intervertebral disc space heights appear preserved. Upper chest: Limited visualization of lung apices demonstrates centrilobular emphysematous change. Other: Regional soft tissues appear normal. Calcified atherosclerotic plaque within the bilateral carotid bulbs. Normal noncontrast appearance of the thyroid gland. No bulky cervical lymphadenopathy on this noncontrast examination. IMPRESSION: 1. Similar findings of atrophy and microvascular ischemic disease without superimposed acute intracranial  process. 2. No acute fracture or static subluxation of the cervical spine. 3. Old T1 spinous process fracture. Electronically Signed   By: Sandi Mariscal M.D.   On: 09/17/2017 13:25   Dg Chest Portable 1 View  Result Date: 09/17/2017 CLINICAL DATA:  Hypoxemia; Per ED notes, last night around 9-10p pt fell and denies LOC; Hx CVA with R  sided deficits; pt lethargic and weak; Hx COPD, sarcoidosis, DM, and HTN. Smoker EXAM: PORTABLE CHEST 1 VIEW COMPARISON:  09/03/2017 FINDINGS: Shallow lung inflation. Heart size is accentuated by portable AP technique. There is stable minimal atelectasis or scarring at the RIGHT lung base. No pulmonary edema. Degenerative changes are seen in thoracic spine. No acute fracture. IMPRESSION: RIGHT LOWER lobe atelectasis. Electronically Signed   By: Nolon Nations M.D.   On: 09/17/2017 12:53    Procedures Procedures (including critical care time)  Medications Ordered in ED Medications  sodium chloride 0.9 % bolus 1,000 mL (has no administration in time range)  lactated ringers infusion (has no administration in time range)  amiodarone (PACERONE) tablet 200 mg (has no administration in time range)  aspirin EC tablet 81 mg (has no administration in time range)  atorvastatin (LIPITOR) tablet 40 mg (has no administration in time range)  mometasone-formoterol (DULERA) 200-5 MCG/ACT inhaler 2 puff (has no administration in time range)  divalproex (DEPAKOTE) DR tablet 250 mg (has no administration in time range)  ergocalciferol (VITAMIN D2) capsule 50,000 Units (has no administration in time range)  pantoprazole (PROTONIX) EC tablet 40 mg (has no administration in time range)  levETIRAcetam (KEPPRA) tablet 500 mg (has no administration in time range)  predniSONE (DELTASONE) tablet 20 mg (has no administration in time range)  risperiDONE (RISPERDAL) tablet 1 mg (has no administration in time range)     Initial Impression / Assessment and Plan / ED Course  I have reviewed  the triage vital signs and the nursing notes.  Pertinent labs & imaging results that were available during my care of the patient were reviewed by me and considered in my medical decision making (see chart for details).     67 year old male with fall at home.  Generalized weakness.  Likely related to underlying dermatomyositis.  He laid on the floor for approximately 12 hours prior to being found.  CK is only minimally elevated.  He does have some AKI. He was given a liter of NS over the course of his ED stay.  CT the head and cervical spine without acute abnormality.  His current condition, patient is unable to adequately care for himself safely at home.  Will admit overnight for ongoing hydration.  Suspect he will ultimately need placement again.  Final Clinical Impressions(s) / ED Diagnoses   Final diagnoses:  Fall, initial encounter  Contusion of face, initial encounter  Dehydration    ED Discharge Orders    None       Virgel Manifold, MD 09/17/17 1705

## 2017-09-17 NOTE — ED Triage Notes (Signed)
Pt arrived via [facility]; pt from [facility], pt lives alone; last around 9-10p pt fell and denies LOC; pt has abrasion r eyebrow; pt c/o intermittent neck pain; c-collar placed; Hx CVA with R sided deficits; lethargic and weak; no blood thinners; Hx COPD; sating at 90% on RA; 96/73; 90 with PVCs; CBG 123

## 2017-09-17 NOTE — Progress Notes (Signed)
Patient came onto unit via stretcher form ED. Report taken form ED RN. Patient belongings with patient. Patient is in room 04. Telemetry on, box: 03. IV fluids running now. Call bell within reach, and will continue to monitor.   Farley Ly RN

## 2017-09-17 NOTE — ED Notes (Addendum)
Pt o2 sat keeps dropping to mid 80s. Elevated HOB. Nasal cannula @ 4L

## 2017-09-17 NOTE — H&P (Addendum)
TRH H&P   Patient Demographics:    Patrick Holt, is a 67 y.o. male  MRN: [id number]   DOB - 1950-11-27  Admit Date - 09/17/2017  Outpatient Primary MD for the patient is [facility]  Patient coming from: Home  Chief Complaint  Patient presents with  . Fall      HPI:    Patrick Holt  is a 67 y.o. male, with history of dermatomyositis, diverticulosis, COPD, ongoing smoking, fibromyalgia, GERD, hypertension, paroxysmal atrial fibrillation not on anticoagulation due to frequent falls, generalized weakness and deconditioning, chronic diastolic CHF echocardiogram done in 2018 November shows a EF of 60%, patient with above dictated history he was recently admitted to the hospital for fall, generalized weakness UTI and discharged to SNF less than 1 week ago, apparently patient went to SNF and signed himself out AMA within the next few days as he did not like his care at that facility.  He went home continued to do poorly, sustained on the fall at home and stayed on the floor for over 12 hours, he finally called help and was brought to the ER.  The ER he is symptom-free but he appears very lethargic, he was found to be severely dehydrated, hypotensive and acute renal failure and had some metabolic encephalopathy, he had no specific subjective complaints or focal weakness.  I was called to admit.  Patient besides generalized weakness and feeling slightly sleepy has no subjective complaints at all.    Review of systems:    A full 10 point Review of Systems was done, except as stated above, all other Review of Systems were negative.   With Past History of the following :    Past Medical History:    Diagnosis Date  . Anemia   . Anxiety   . Asthma   . Atrial fibrillation ([place]) 01/2012   with RVR  . Bipolar 1 disorder ([place])   . Cancer ([place])   . Collagen vascular disease ([place])   . Conversion disorder 06/2010  . COPD (chronic obstructive pulmonary disease) ([place])   . Dermatomyositis ([place])   . Dermatomyositis ([place])   . Diverticulitis   . Diverticulosis of colon   . Dysrhythmia    "irregular" (11/15/2012)  . Esophageal dysmotility   . Esophageal stricture   . Fibromyalgia   .  Gastritis   . GERD (gastroesophageal reflux disease)   . Hand fracture, right    sept 13, 2017, still wearing splint as og 01-10-2017  . Headache(784.0)    "severe; get them often" (11/15/2012)  . Hiatal hernia   . History of narcotic addiction ([place])   . Hx of adenomatous colonic polyps   . Hyperlipidemia   . Hypertension   . Internal hemorrhoids   . Ischemic heart disease   . Major depression    with acute psychotic break in 06/2010  . Myocardial infarct ([place])    mulitple (1999, 2000, 2004)  . Narcotic dependence ([place])   . Nephrolithiasis   . Obesity   . OCD (obsessive compulsive disorder)   . Otosclerosis   . Paroxysmal A-fib ([place])   . Peripheral neuropathy   . Raynaud's disease   . Renal insufficiency   . Rheumatoid arthritis(714.0)   . Sarcoidosis   . Seizures ([place])   . Subarachnoid hemorrhage ([place]) 01/2012   with subdural  hematoma.   . Type II diabetes mellitus ([place])   . Urge incontinence   . Vertigo       Past Surgical History:  Procedure Laterality Date  . BACK SURGERY    . CARDIAC CATHETERIZATION    . CARPAL TUNNEL RELEASE Bilateral   . CATARACT EXTRACTION W/ INTRAOCULAR LENS IMPLANT Left   . COLONOSCOPY N/A 09/27/2012   Procedure: COLONOSCOPY;  Surgeon: Lafayette Dragon, MD;  Location: [facility] ENDOSCOPY;  Service: Endoscopy;  Laterality: N/A;  . ESOPHAGOGASTRODUODENOSCOPY N/A 09/27/2012   Procedure: ESOPHAGOGASTRODUODENOSCOPY (EGD);  Surgeon: Lafayette Dragon, MD;  Location: [facility];  Service: Endoscopy;  Laterality: N/A;  . KNEE ARTHROSCOPY W/ MENISCAL REPAIR Left 2009  . LUMBAR DISC SURGERY    . LYMPH NODE DISSECTION Right 1970's   "neck; dr thought I had Hodgkins; turned out to be sarcoidosis" (11/15/2012)  . squamous papilloma   2010   removed by Dr. Constance Holster ENT, noted on tongue  . TONSILLECTOMY        Social History:     Social History   Tobacco Use  . Smoking status: Light Tobacco Smoker    Packs/day: 0.50    Years: 30.00    Pack years: 15.00    Types: Cigarettes    Last attempt to quit: 12/15/2014    Years since quitting: 2.7  . Smokeless tobacco: Never Used  . Tobacco comment: 1/2 cig BID  Substance Use Topics  . Alcohol use: No    Alcohol/week: 0.0 oz    Comment: Alcohol stopped in September of 2014         Family History :     Family History  Problem Relation Age of Onset  . Alcohol abuse Mother   . Depression Mother   . Heart disease Mother   . Diabetes Mother   . Stroke Mother   . Coronary artery disease Father   . Diabetes Other        1/2 brother  . Hepatitis Brother       Home Medications:   Prior to Admission medications   Medication Sig Start Date End Date Taking? Authorizing Provider  amiodarone (PACERONE) 200 MG tablet Take 1 tablet (200 mg total) by mouth daily. 09/07/17   Oretha Milch D, MD  amLODipine (NORVASC) 5 MG tablet Take 1 tablet (5 mg total) by mouth daily. 09/07/17   Oretha Milch D, MD  aspirin EC 81 MG tablet Take 1 tablet (81 mg total) by mouth daily.  09/07/17   Desiree Hane, MD  atorvastatin (LIPITOR) 40 MG tablet Take 1 tablet (40 mg total) by mouth daily. 09/07/17   Desiree Hane, MD  budesonide-formoterol (SYMBICORT) 160-4.5 MCG/ACT inhaler Inhale 2 puffs into the lungs 2 (two) times daily. 09/07/17   Oretha Milch D, MD  cefdinir (OMNICEF) 300 MG capsule Take 1 capsule (300 mg total) by mouth every 12 (twelve) hours. For 5 days 09/07/17   Desiree Hane, MD  divalproex (DEPAKOTE)  250 MG DR tablet Take 1 tablet (250 mg total) by mouth 3 (three) times daily. 09/07/17   Oretha Milch D, MD  ergocalciferol (VITAMIN D2) 50000 units capsule Take 1 capsule (50,000 Units total) by mouth once a week. 09/07/17   Desiree Hane, MD  esomeprazole (NEXIUM) 40 MG capsule Take 1 capsule (40 mg total) by mouth daily. 09/07/17   Desiree Hane, MD  furosemide (LASIX) 20 MG tablet Take 1 tablet (20 mg total) by mouth daily. 09/07/17   Oretha Milch D, MD  ipratropium-albuterol (DUONEB) 0.5-2.5 (3) MG/3ML SOLN Inhale 3 mLs into the lungs every 6 (six) hours as needed. 09/07/17   Desiree Hane, MD  levETIRAcetam (KEPPRA) 500 MG tablet Take 1 tablet (500 mg total) by mouth 2 (two) times daily. 09/07/17   Desiree Hane, MD  loperamide (IMODIUM) 2 MG capsule Take 1 capsule (2 mg total) by mouth as needed for diarrhea or loose stools. 09/07/17   Desiree Hane, MD  Multiple Vitamin (MULTIVITAMIN WITH MINERALS) TABS tablet Take 1 tablet by mouth daily. 09/07/17   Desiree Hane, MD  oxybutynin (DITROPAN) 5 MG tablet Take 1 tablet (5 mg total) by mouth 3 (three) times daily. 09/07/17   Oretha Milch D, MD  Potassium Chloride ER 20 MEQ TBCR Take 20 mEq by mouth daily. 09/07/17   Desiree Hane, MD  predniSONE (DELTASONE) 10 MG tablet Take 3 tablets (30 mg total) by mouth daily with breakfast. 09/08/17   Oretha Milch D, MD  predniSONE (DELTASONE) 10 MG tablet Take 1 tablet (10 mg total) by mouth daily with breakfast. 09/10/17   Oretha Milch D, MD  predniSONE (DELTASONE) 20 MG tablet Take 1 tablet (20 mg total) by mouth daily with breakfast. 09/09/17   Oretha Milch D, MD  risperiDONE (RISPERDAL) 1 MG tablet Take 1 tablet (1 mg total) by mouth 2 (two) times daily. 09/07/17   Desiree Hane, MD  senna-docusate (SENOKOT-S) 8.6-50 MG tablet Take 1 tablet by mouth at bedtime as needed for mild constipation. 09/07/17   Desiree Hane, MD  traMADol (ULTRAM) 50 MG tablet Take 1 tablet (50 mg total)  by mouth every 6 (six) hours as needed for moderate pain. 09/07/17   Desiree Hane, MD     Allergies:     Allergies  Allergen Reactions  . Immune Globulins Other (See Comments)    Acute renal failure  . Ciprofloxacin Swelling  . Flagyl [Metronidazole] Swelling  . Lisinopril Diarrhea  . Sulfa Antibiotics Other (See Comments)    blisters  . Cefepime     Diffuse maculopapular rash when receiving both vanc and cefepime, timing unclear.  . Morphine And Related     Does not help with pain  . Requip [Ropinirole Hcl]     constipation  . Vancomycin     Diffuse maculopapular rash when receiving both vanc/cefepime.  Timing unclear.    . Betamethasone Dipropionate Other (See Comments)    Unknown  . Bupropion Hcl  Other (See Comments)    Unknown  . Clobetasol Other (See Comments)    Unknown  . Codeine Other (See Comments)    Does not help with pain  . Escitalopram Oxalate Other (See Comments)    Unknown  . Fluoxetine Hcl Other (See Comments)    Unknown  . Meclizine Rash  . Paroxetine Other (See Comments)    Unknown  . Penicillins Other (See Comments)    Unknown reaction Has patient had a PCN reaction causing immediate rash, facial/tongue/throat swelling, SOB or lightheadedness with hypotension: unknown Has patient had a PCN reaction causing severe rash involving mucus membranes or skin necrosis: unknown Has patient had a PCN reaction that required hospitalization unknown Has patient had a PCN reaction occurring within the last 10 years: unknown If all of the above answers are "NO", then may proceed with Cephalosporin use. Unknown Unknown reactio  . Tacrolimus Other (See Comments)    Unknown  . Tetanus Toxoid Other (See Comments)    Unknown  . Tuberculin Purified Protein Derivative Other (See Comments)    Unknown     Physical Exam:   Vitals  Blood pressure (!) 102/56, pulse 69, temperature 98.5 F (36.9 C), temperature source Oral, resp. rate 15, SpO2 (!) 88 %.   1.  General elderly poorly kept white male lying in hospital bed somewhat somnolent and fatigued but in no apparent distress,  2. Normal affect and insight, Not Suicidal or Homicidal, he is alert.  3. No F.N deficits, ALL C.Nerves Intact, Strength 5/5 all 4 extremities, Sensation intact all 4 extremities, Plantars down going.  4. Ears and Eyes appear Normal, Conjunctivae clear, PERRLA. Moist Oral Mucosa.  Small abrasion in the scalp and tip of the nose from recent fall,  5. Supple Neck, No JVD, No cervical lymphadenopathy appriciated, No Carotid Bruits.  6. Symmetrical Chest wall movement, Good air movement bilaterally, CTAB.  7. RRR, No Gallops, Rubs or Murmurs, No Parasternal Heave.  8. Positive Bowel Sounds, Abdomen Soft, No tenderness, No organomegaly appriciated,No rebound -guarding or rigidity.  9.  No Cyanosis, Normal Skin Turgor, No Skin Rash or Bruise.  Chronic 1+ edema in both legs with some lichenification of the skin  10. Good muscle tone,  joints appear normal , no effusions, Normal ROM.  11. No Palpable Lymph Nodes in Neck or Axillae      Data Review:    CBC Recent Labs  Lab 09/17/17 1201  WBC 8.7  HGB 13.0  HCT 42.5  PLT 221  MCV 88.0  MCH 26.9  MCHC 30.6  RDW 21.1*  LYMPHSABS 2.4  MONOABS 0.9  EOSABS 0.1  BASOSABS 0.0   ------------------------------------------------------------------------------------------------------------------  Chemistries  Recent Labs  Lab 09/17/17 1201  NA 143  K 3.9  CL 105  CO2 28  GLUCOSE 82  BUN 25*  CREATININE 1.76*  CALCIUM 8.9  MG 2.0   ------------------------------------------------------------------------------------------------------------------ estimated creatinine clearance is 43.4 mL/min (A) (by C-G formula based on SCr of 1.76 mg/dL (H)). ------------------------------------------------------------------------------------------------------------------ No results for input(s): TSH, T4TOTAL, T3FREE,  THYROIDAB in the last 72 hours.  Invalid input(s): FREET3  Coagulation profile No results for input(s): INR, PROTIME in the last 168 hours. ------------------------------------------------------------------------------------------------------------------- No results for input(s): DDIMER in the last 72 hours. -------------------------------------------------------------------------------------------------------------------  Cardiac Enzymes Recent Labs  Lab 09/17/17 1201  TROPONINI <0.03   ------------------------------------------------------------------------------------------------------------------    Component Value Date/Time   BNP 33.3 09/17/2017 1201     ---------------------------------------------------------------------------------------------------------------  Urinalysis    Component Value Date/Time  COLORURINE YELLOW 09/04/2017 1750   APPEARANCEUR CLEAR 09/04/2017 1750   LABSPEC 1.010 09/04/2017 1750   PHURINE 7.0 09/04/2017 1750   GLUCOSEU NEGATIVE 09/04/2017 1750   HGBUR SMALL (A) 09/04/2017 1750   HGBUR negative 12/11/2009 1356   BILIRUBINUR NEGATIVE 09/04/2017 1750   BILIRUBINUR MODERATE 06/05/2015 1407   KETONESUR 5 (A) 09/04/2017 1750   PROTEINUR NEGATIVE 09/04/2017 1750   UROBILINOGEN 2.0 06/05/2015 1407   UROBILINOGEN 1.0 12/30/2014 1830   NITRITE NEGATIVE 09/04/2017 1750   LEUKOCYTESUR NEGATIVE 09/04/2017 1750    ----------------------------------------------------------------------------------------------------------------   Imaging Results:    Ct Head Wo Contrast  Result Date: 09/17/2017 CLINICAL DATA:  Patient fell last evening, now with lethargy. History of stroke. EXAM: CT HEAD WITHOUT CONTRAST CT CERVICAL SPINE WITHOUT CONTRAST TECHNIQUE: Multidetector CT imaging of the head and cervical spine was performed following the standard protocol without intravenous contrast. Multiplanar CT image reconstructions of the cervical spine were also  generated. COMPARISON:  Head CT - 09/03/2017; head and C-spine CT-06/16/2017 FINDINGS: CT HEAD FINDINGS Brain: Moderately advanced atrophy with sulcal prominence and centralized volume loss with commensurate ex vacuo dilatation the ventricular system. Periventricular hypodensities compatible microvascular ischemic disease. Given background parenchymal abnormalities, there is no CT evidence superimposed acute large territory infarct. No intraparenchymal or extra-axial mass or hemorrhage. Unchanged size and configuration of the ventricles and the basilar cisterns. No midline shift. Vascular: Intracranial atherosclerosis. Skull: No displaced calvarial fracture. Sinuses/Orbits: Limited visualization of the paranasal sinuses and mastoid air cells is normal. No air-fluid levels. A small amount of debris is seen within the bilateral external auditory canals. Post left-sided cataract surgery. Other: Regional soft tissues appear normal. CT CERVICAL SPINE FINDINGS Alignment: C1 to the superior endplate of T2 is imaged. Mild scoliotic curvature of the cervicothoracic spine, potentially positional. Otherwise, normal alignment of the cervical spine. No anterolisthesis or retrolisthesis. Bilateral facets are normally aligned. Skull base and vertebrae: The dens is normally positioned between the lateral masses of C1. Mild degenerative change of the atlantodental articulation. Normal atlantoaxial articulations. Cervical vertebral body heights are preserved. Old fracture involving the T1 spinous process (sagittal image 18, series 10) Soft tissues and spinal canal: Prevertebral soft tissues are normal. Disc levels: Cervical intervertebral disc space heights appear preserved. Upper chest: Limited visualization of lung apices demonstrates centrilobular emphysematous change. Other: Regional soft tissues appear normal. Calcified atherosclerotic plaque within the bilateral carotid bulbs. Normal noncontrast appearance of the thyroid gland.  No bulky cervical lymphadenopathy on this noncontrast examination. IMPRESSION: 1. Similar findings of atrophy and microvascular ischemic disease without superimposed acute intracranial process. 2. No acute fracture or static subluxation of the cervical spine. 3. Old T1 spinous process fracture. Electronically Signed   By: Sandi Mariscal M.D.   On: 09/17/2017 13:25   Ct Cervical Spine Wo Contrast  Result Date: 09/17/2017 CLINICAL DATA:  Patient fell last evening, now with lethargy. History of stroke. EXAM: CT HEAD WITHOUT CONTRAST CT CERVICAL SPINE WITHOUT CONTRAST TECHNIQUE: Multidetector CT imaging of the head and cervical spine was performed following the standard protocol without intravenous contrast. Multiplanar CT image reconstructions of the cervical spine were also generated. COMPARISON:  Head CT - 09/03/2017; head and C-spine CT-06/16/2017 FINDINGS: CT HEAD FINDINGS Brain: Moderately advanced atrophy with sulcal prominence and centralized volume loss with commensurate ex vacuo dilatation the ventricular system. Periventricular hypodensities compatible microvascular ischemic disease. Given background parenchymal abnormalities, there is no CT evidence superimposed acute large territory infarct. No intraparenchymal or extra-axial mass or hemorrhage. Unchanged size and configuration  of the ventricles and the basilar cisterns. No midline shift. Vascular: Intracranial atherosclerosis. Skull: No displaced calvarial fracture. Sinuses/Orbits: Limited visualization of the paranasal sinuses and mastoid air cells is normal. No air-fluid levels. A small amount of debris is seen within the bilateral external auditory canals. Post left-sided cataract surgery. Other: Regional soft tissues appear normal. CT CERVICAL SPINE FINDINGS Alignment: C1 to the superior endplate of T2 is imaged. Mild scoliotic curvature of the cervicothoracic spine, potentially positional. Otherwise, normal alignment of the cervical spine. No  anterolisthesis or retrolisthesis. Bilateral facets are normally aligned. Skull base and vertebrae: The dens is normally positioned between the lateral masses of C1. Mild degenerative change of the atlantodental articulation. Normal atlantoaxial articulations. Cervical vertebral body heights are preserved. Old fracture involving the T1 spinous process (sagittal image 18, series 10) Soft tissues and spinal canal: Prevertebral soft tissues are normal. Disc levels: Cervical intervertebral disc space heights appear preserved. Upper chest: Limited visualization of lung apices demonstrates centrilobular emphysematous change. Other: Regional soft tissues appear normal. Calcified atherosclerotic plaque within the bilateral carotid bulbs. Normal noncontrast appearance of the thyroid gland. No bulky cervical lymphadenopathy on this noncontrast examination. IMPRESSION: 1. Similar findings of atrophy and microvascular ischemic disease without superimposed acute intracranial process. 2. No acute fracture or static subluxation of the cervical spine. 3. Old T1 spinous process fracture. Electronically Signed   By: Sandi Mariscal M.D.   On: 09/17/2017 13:25   Dg Chest Portable 1 View  Result Date: 09/17/2017 CLINICAL DATA:  Hypoxemia; Per ED notes, last night around 9-10p pt fell and denies LOC; Hx CVA with R sided deficits; pt lethargic and weak; Hx COPD, sarcoidosis, DM, and HTN. Smoker EXAM: PORTABLE CHEST 1 VIEW COMPARISON:  09/03/2017 FINDINGS: Shallow lung inflation. Heart size is accentuated by portable AP technique. There is stable minimal atelectasis or scarring at the RIGHT lung base. No pulmonary edema. Degenerative changes are seen in thoracic spine. No acute fracture. IMPRESSION: RIGHT LOWER lobe atelectasis. Electronically Signed   By: Nolon Nations M.D.   On: 09/17/2017 12:53    My personal review of EKG: Rhythm NSR, no acute ST changes   Assessment & Plan:     1.  Recurrent falls at home with over 12 hours  being on the floor unattended, mild rhabdomyolysis, severe dehydration, hypotension and ARF.  Recently admitted to SNF but signed out AMA from there, lives alone no family.  He will be kept in 23-hour observation, IV fluid bolus and maintenance, repeat CK levels and BMP tomorrow, monitor urine output.  CT head and C-spine unremarkable and exam is nonfocal, no headache.  Supportive care.  PT eval tomorrow.  He now agrees for replacement to SNF realizes that he will die alone at home.  2.  Hypotension.  Due to #1 above, hold blood pressure medications hydrate and monitor.  3.  History of COPD with ongoing smoking.  Counseled to quit smoking, no exacerbation, supportive care with as needed nebulizer treatments and oxygen as needed.  4.  HX of dermatomyositis.  No acute issues.  Not recently on his last admission he was diagnosed with a possible dermatomyositis flare and he was placed on prednisone, for now continue 20 mg of prednisone and gradually taper.  I do not think he has been on steroids long enough for adrenal suppression.  He took it for 4 days and then stopped taking it.  5.  Diastolic CHF with recent echocardiogram showing a EF of 60% in November 2018.  Currently  intravascularly dehydrated, hold home dose Lasix hydrate and monitor.  Hold blood pressure medications.  Note he has chronic lower extremity swelling which could be due to underlying dermatomyositis.  Once his clinical status is better and blood pressures are stable home dose Lasix can be resumed.  6.  Generalized weakness and deconditioning.  PT eval, SNF placement.  7.  Paroxysmal atrial fibrillation.  Mali vas 2 score of at least 3.  Currently in sinus, on amiodarone which will be continued, not on anticoagulation due to frequent falls continue aspirin.  8.  Right lower extremity DVT.  Last admission plan was formulated to repeat right lower extremity venous ultrasound on 10/09/2017 which should be done.  No anticoagulation was  planned due to frequent falls.  Note he will require repeat ultrasound on the mentioned date.  9.  History of CAD.  No acute issues supportive care.  Aspirin and statin for secondary prevention.  10.  Dyslipidemia.  Resume statin from tomorrow once CK levels are better.  11.  Mild acute metabolic encephalopathy.  Much improved with hydration continue to hydrate and monitor.  Head CT unremarkable, no focal deficits or headache.  At risk for delirium, minimize benzodiazepines and narcotics.  12.  Seizures.  Home antiseizure medications will be continued, they include Depakote and Keppra.  13.  GERD.  On PPI continue.    DVT Prophylaxis Heparin   AM Labs Ordered, also please review Full Orders  Family Communication: Admission, patients condition and plan of care including tests being ordered have been discussed with the patient  who indicates understanding and agree with the plan and Code Status.  Code Status Full  Likely DC to  SNF  Condition GUARDED    Consults called: None    Admission status: Obs    Time spent in minutes : 35   Lala Lund M.D on 09/17/2017 at 5:23 PM  To page go to [email or web address] - password [email or web address]

## 2017-09-18 DIAGNOSIS — E860 Dehydration: Secondary | ICD-10-CM | POA: Diagnosis not present

## 2017-09-18 DIAGNOSIS — N179 Acute kidney failure, unspecified: Secondary | ICD-10-CM | POA: Diagnosis not present

## 2017-09-18 DIAGNOSIS — G9341 Metabolic encephalopathy: Secondary | ICD-10-CM | POA: Diagnosis not present

## 2017-09-18 LAB — CBC
HCT: 29.2 % — ABNORMAL LOW (ref 39.0–52.0)
HEMOGLOBIN: 8.7 g/dL — AB (ref 13.0–17.0)
MCH: 27 pg (ref 26.0–34.0)
MCHC: 29.8 g/dL — ABNORMAL LOW (ref 30.0–36.0)
MCV: 90.7 fL (ref 78.0–100.0)
Platelets: 144 10*3/uL — ABNORMAL LOW (ref 150–400)
RBC: 3.22 MIL/uL — AB (ref 4.22–5.81)
RDW: 20.7 % — ABNORMAL HIGH (ref 11.5–15.5)
WBC: 5 10*3/uL (ref 4.0–10.5)

## 2017-09-18 LAB — COMPREHENSIVE METABOLIC PANEL
ALBUMIN: 1.7 g/dL — AB (ref 3.5–5.0)
ALT: 9 U/L (ref 0–44)
AST: 14 U/L — AB (ref 15–41)
Alkaline Phosphatase: 30 U/L — ABNORMAL LOW (ref 38–126)
Anion gap: 13 (ref 5–15)
BUN: 14 mg/dL (ref 8–23)
CHLORIDE: 108 mmol/L (ref 98–111)
CO2: 20 mmol/L — ABNORMAL LOW (ref 22–32)
CREATININE: 0.85 mg/dL (ref 0.61–1.24)
Calcium: 7.6 mg/dL — ABNORMAL LOW (ref 8.9–10.3)
GFR calc Af Amer: >60 mL/min (ref >60)
GFR calc non Af Amer: >60 mL/min (ref >60)
Glucose, Bld: 54 mg/dL — ABNORMAL LOW (ref 70–99)
POTASSIUM: 3.9 mmol/L (ref 3.5–5.1)
Sodium: 141 mmol/L (ref 135–145)
Total Bilirubin: 0.5 mg/dL (ref 0.3–1.2)
Total Protein: 3.4 g/dL — ABNORMAL LOW (ref 6.5–8.1)

## 2017-09-18 LAB — CK: CK TOTAL: 278 U/L (ref 49–397)

## 2017-09-18 LAB — GLUCOSE, CAPILLARY: Glucose-Capillary: 132 mg/dL — ABNORMAL HIGH (ref 70–99)

## 2017-09-18 NOTE — Progress Notes (Signed)
Patient was taught how to use incentive spirometer. Teach back method was used. Will continue to monitor.   Farley Ly RN

## 2017-09-18 NOTE — Progress Notes (Signed)
MD stated that Oxybutynin is currently being held d/t dehydration and AMS. Informed to address with hospitalist during the day for restarting.  Patrick Holt

## 2017-09-18 NOTE — Evaluation (Signed)
Physical Therapy Evaluation Patient Details Name: Patrick Holt MRN: [id number] DOB: 05/08/1950 Today's Date: 09/18/2017   History of Present Illness   67 y.o. male with a known history of COPD, HTN, HLD, CAD,  Atrial fib, bipolar disorder, seizure disorder, dermatomyositis/ RA, CVA presents to the emergency department for evaluation of weakness, body aches. mild rhabdomyolysis, dehydration, acute renal failure  Clinical Impression   Pt admitted with above diagnosis. Pt currently with functional limitations due to the deficits listed below (see PT Problem List). Admitted after fall at home (down for approx 12 hours); Recently admitted to SNF but signed out AMA from there, lives alone no family. Presents with profound weakness, bradykinesia, decr functional mobility;  Pt will benefit from skilled PT to increase their independence and safety with mobility to allow discharge to the venue listed below.    He is unable to care for himself at home alone; Worth considering Palliative Care Team consult for an extra layer of support.     Follow Up Recommendations SNF;Supervision/Assistance - 24 hour;Supervision for mobility/OOB    Equipment Recommendations  Other (comment)(To be determined)    Recommendations for Other Services OT consult(ordred per protocol)     Precautions / Restrictions Precautions Precautions: Fall Precaution Comments: mulitple falls in past year; most recent 1 month ago; monitor HR  Restrictions Weight Bearing Restrictions: No      Mobility  Bed Mobility Overal bed mobility: Needs Assistance Bed Mobility: Supine to Sit;Sit to Supine;Rolling Rolling: Mod assist   Supine to sit: Max assist Sit to supine: Mod assist   General bed mobility comments: assist to raise trunk, advance BLEs, and to scoot hips towards EOB; assist to bring LEs into bed  Transfers Overall transfer level: Needs assistance Equipment used: Rolling walker (2 wheeled) Transfers: Sit to/from  Stand(could not work on stand pivot due to pt weakness) Sit to Stand: Mod assist         General transfer comment: Movements are especially slow today, bradykinesia; mod assist to power up to stand  Ambulation/Gait             General Gait Details: Unable to assess due to pt weakness. After standing for a few minutes, stated need to sit back down.  Stairs            Wheelchair Mobility    Modified Rankin (Stroke Patients Only)       Balance Overall balance assessment: Needs assistance;History of Falls Sitting-balance support: Feet supported Sitting balance-Leahy Scale: Fair     Standing balance support: Bilateral upper extremity supported Standing balance-Leahy Scale: Poor Standing balance comment: relies on BUE support                             Pertinent Vitals/Pain Pain Assessment: Faces Pain Score: 4  Faces Pain Scale: Hurts little more Pain Location: bil feet with wt bearing Pain Descriptors / Indicators: Grimacing Pain Intervention(s): Repositioned;Monitored during session;Limited activity within patient's tolerance    Home Living Family/patient expects to be discharged to:: Unsure Living Arrangements: Alone Available Help at Discharge: Personal care attendant Type of Home: Apartment Home Access: Elevator     Home Layout: One level Home Equipment: Environmental consultant - 2 wheels;Cane - single point;Electric scooter Additional Comments: Ship broker    Prior Function Level of Independence: Needs Water engineer / Transfers Assistance Needed: amb short distances with RW and uses scooter for longer distances--was unable to get out  of bed for 2 days prior to admission  ADL's / Homemaking Assistance Needed: aide assists with bathing/dressing; sponge bathes  Comments: aide comes 3x/week     Hand Dominance   Dominant Hand: Right    Extremity/Trunk Assessment   Upper Extremity Assessment Upper Extremity Assessment:  Generalized weakness(limited AROM due to weakness, grossly 3-/5, tremors noted ) LUE Deficits / Details: Deficits as noted above, but more pronounced in LUE than in RUE    Lower Extremity Assessment Lower Extremity Assessment: Generalized weakness       Communication   Communication: No difficulties  Cognition Arousal/Alertness: Awake/alert Behavior During Therapy: WFL for tasks assessed/performed Overall Cognitive Status: Within Functional Limits for tasks assessed                                        General Comments General comments (skin integrity, edema, etc.): pt has small abrasions over forehead and nose; dry skin; LE edematous    Exercises     Assessment/Plan    PT Assessment Patient needs continued PT services  PT Problem List Decreased activity tolerance;Decreased strength;Decreased balance;Decreased mobility;Pain;Decreased range of motion;Decreased coordination;Decreased knowledge of use of DME;Decreased safety awareness       PT Treatment Interventions DME instruction;Gait training;Therapeutic exercise;Therapeutic activities;Functional mobility training;Patient/family education;Balance training    PT Goals (Current goals can be found in the Care Plan section)  Acute Rehab PT Goals Patient Stated Goal: Did not state, but agreeable to try getting OOB PT Goal Formulation: With patient Time For Goal Achievement: 10/02/17 Potential to Achieve Goals: Good    Frequency Min 2X/week   Barriers to discharge Decreased caregiver support I'm unsure how sustainable Mr. Noell's current living situation is    Co-evaluation               AM-PAC PT "6 Clicks" Daily Activity  Outcome Measure Difficulty turning over in bed (including adjusting bedclothes, sheets and blankets)?: Unable Difficulty moving from lying on back to sitting on the side of the bed? : Unable Difficulty sitting down on and standing up from a chair with arms (e.g., wheelchair,  bedside commode, etc,.)?: Unable Help needed moving to and from a bed to chair (including a wheelchair)?: A Lot Help needed walking in hospital room?: Total Help needed climbing 3-5 steps with a railing? : Total 6 Click Score: 7    End of Session Equipment Utilized During Treatment: Gait belt;Oxygen Activity Tolerance: Patient limited by fatigue;Patient limited by pain;Treatment limited secondary to medical complications (Comment) Patient left: in bed;with call bell/phone within reach Nurse Communication: Mobility status;Precautions PT Visit Diagnosis: Unsteadiness on feet (R26.81);Repeated falls (R29.6);Muscle weakness (generalized) (M62.81);History of falling (Z91.81);Difficulty in walking, not elsewhere classified (R26.2);Pain Pain - part of body: Ankle and joints of foot    Time: 8937-3428 PT Time Calculation (min) (ACUTE ONLY): 36 min   Charges:   PT Evaluation $PT Eval Moderate Complexity: 1 Mod PT Treatments $Therapeutic Activity: 8-22 mins        Roney Marion, PT  Acute Rehabilitation Services Pager [phone] Office [place] 09/18/2017, 6:09 PM

## 2017-09-18 NOTE — Care Management Obs Status (Signed)
[place] NOTIFICATION   Patient Details  Name: Patrick Holt MRN: [id number] Date of Birth: 1951-01-22   Medicare Observation Status Notification Given:  Yes    Carles Collet, RN 09/18/2017, 11:23 AM

## 2017-09-18 NOTE — Progress Notes (Signed)
PROGRESS NOTE  Patrick Holt ENI:[id number] DOB: 02-27-1950 DOA: 09/17/2017 PCP: Medicine, [facility]  HPI/Recap of past 24 hours:   Patrick Holt  is a 67 y.o. male, with history of dermatomyositis, diverticulosis, COPD, ongoing smoking, fibromyalgia, GERD, hypertension, paroxysmal atrial fibrillation not on anticoagulation due to frequent falls, generalized weakness and deconditioning, chronic diastolic CHF echocardiogram done in 2018 November shows a EF of 60%, patient with above dictated history he was recently admitted to the hospital for fall, generalized weakness UTI and discharged to SNF less than 1 week ago, apparently patient went to SNF and signed himself out AMA within the next few days as he did not like his care at that facility.  He went home continued to do poorly, sustained on the fall at home and stayed on the floor for over 12 hours, he finally called help and was brought to the ER.  The ER he is symptom-free but he appears very lethargic, he was found to be severely dehydrated, hypotensive and acute renal failure and had some metabolic encephalopathy, he had no specific subjective complaints or focal weakness.  I was called to admit.  Patient besides generalized weakness and feeling slightly sleepy has no subjective complaints at all.      Assessment/Plan: Principal Problem:   ARF (acute renal failure) (HCC) Active Problems:   Tobacco abuse   COPD (chronic obstructive pulmonary disease) (HCC)   Dermatomyositis (HCC)   History of seizure disorder   Dehydration   Essential hypertension, benign   Severe dehydration   Pulmonary artery hypertension (HCC)   PAF (paroxysmal atrial fibrillation) (HCC)   Frequent falls   Seizure disorder ([place])   Acute metabolic encephalopathy    1.  Recurrent falls at home with over 12 hours being on the floor unattended, mild rhabdomyolysis, severe dehydration, hypotension and ARF.  Recently admitted to SNF but  signed out AMA from there, lives alone no family.  He will be kept in 23-hour observation, IV fluid bolus and maintenance, repeat CK levels and BMP tomorrow, monitor urine output.  CT head and C-spine unremarkable and exam is nonfocal, no headache.  Supportive care.  PT eval tomorrow.  He now agrees for replacement to SNF realizes that he will die alone at home.  2.  Hypotension.  Due to #1 above, hold blood pressure medications hydrate and monitor.  3.  History of COPD with ongoing smoking.  Counseled to quit smoking, no exacerbation, supportive care with as needed nebulizer treatments and oxygen as needed.  4.  HX of dermatomyositis.  No acute issues.  Not recently on his last admission he was diagnosed with a possible dermatomyositis flare and he was placed on prednisone, for now continue 20 mg of prednisone and gradually taper.  I do not think he has been on steroids long enough for adrenal suppression.  He took it for 4 days and then stopped taking it.  5.  Diastolic CHF with recent echocardiogram showing a EF of 60% in November 2018.  Currently intravascularly dehydrated, hold home dose Lasix hydrate and monitor.  Hold blood pressure medications.  Note he has chronic lower extremity swelling which could be due to underlying dermatomyositis.  Once his clinical status is better and blood pressures are stable home dose Lasix can be resumed.  6.  Generalized weakness and deconditioning.  PT eval, SNF placement.  7.  Paroxysmal atrial fibrillation.  Mali vas 2 score of at least 3.  Currently in sinus, on amiodarone which  will be continued, not on anticoagulation due to frequent falls continue aspirin.  8.  Right lower extremity DVT.  Last admission plan was formulated to repeat right lower extremity venous ultrasound on 10/09/2017 which should be done.  No anticoagulation was planned due to frequent falls.  Note he will require repeat ultrasound on the mentioned date.  9.  History of CAD.  No  acute issues supportive care.  Aspirin and statin for secondary prevention.  10.  Dyslipidemia.  Resume statin from tomorrow once CK levels are better.  11.  Mild acute metabolic encephalopathy.  Much improved with hydration continue to hydrate and monitor.  Head CT unremarkable, no focal deficits or headache.  At risk for delirium, minimize benzodiazepines and narcotics.  12.  Seizures.  Home antiseizure medications will be continued, they include Depakote and Keppra.  13.  GERD.  On PPI continue.    Code Status: Full  Family Communication: None at bedside  Disposition Plan: Likely to SNF   Consultants:  None  Procedures:  None  Antimicrobials:  None  DVT prophylaxis: Heparin   Objective: Vitals:   09/18/17 0908 09/18/17 1729  BP: (!) 94/50 (!) 93/58  Pulse: (!) 59 (!) 56  Resp: 12 14  Temp: 98.6 F (37 C) 97.7 F (36.5 C)  SpO2: 96% 96%    Intake/Output Summary (Last 24 hours) at 09/18/2017 1754 Last data filed at 09/18/2017 1102 Gross per 24 hour  Intake 2629.23 ml  Output 500 ml  Net 2129.23 ml   There were no vitals filed for this visit. There is no height or weight on file to calculate BMI.  Exam:   General: Well-nourished elderly male poorly kept  Cardiovascular: Normal rate and rhythm no murmur  Respiratory: Even and unlabored no rhonchi no rales  Abdomen: Slightly obese nontender normoactive bowel sounds  Musculoskeletal: Moves all 4 extremities gait was not assessed.  He has chronic lower extremity edema +2 there is scaliness and lichenification of his skin  Skin: Bruising noted on the forehead from her fall  Psychiatry: He is in good mood or hallucination all delusion   Data Reviewed: CBC: Recent Labs  Lab 09/17/17 1201 09/18/17 0413  WBC 8.7 5.0  NEUTROABS 5.3  --   HGB 13.0 8.7*  HCT 42.5 29.2*  MCV 88.0 90.7  PLT 221 665*   Basic Metabolic Panel: Recent Labs  Lab 09/17/17 1201 09/18/17 0413  NA 143 141  K 3.9  3.9  CL 105 108  CO2 28 20*  GLUCOSE 82 54*  BUN 25* 14  CREATININE 1.76* 0.85  CALCIUM 8.9 7.6*  MG 2.0  --    GFR: Estimated Creatinine Clearance: 89.9 mL/min (by C-G formula based on SCr of 0.85 mg/dL). Liver Function Tests: Recent Labs  Lab 09/18/17 0413  AST 14*  ALT 9  ALKPHOS 30*  BILITOT 0.5  PROT 3.4*  ALBUMIN 1.7*   No results for input(s): LIPASE, AMYLASE in the last 168 hours. No results for input(s): AMMONIA in the last 168 hours. Coagulation Profile: No results for input(s): INR, PROTIME in the last 168 hours. Cardiac Enzymes: Recent Labs  Lab 09/17/17 1201 09/18/17 0413  CKTOTAL 550* 278  TROPONINI <0.03  --    BNP (last 3 results) No results for input(s): PROBNP in the last 8760 hours. HbA1C: No results for input(s): HGBA1C in the last 72 hours. CBG: Recent Labs  Lab 09/17/17 1151 09/17/17 1822 09/17/17 2207 09/18/17 0953  GLUCAP 228* 66* 95 132*  Lipid Profile: No results for input(s): CHOL, HDL, LDLCALC, TRIG, CHOLHDL, LDLDIRECT in the last 72 hours. Thyroid Function Tests: Recent Labs    09/17/17 1934  TSH 0.756   Anemia Panel: No results for input(s): VITAMINB12, FOLATE, FERRITIN, TIBC, IRON, RETICCTPCT in the last 72 hours. Urine analysis:    Component Value Date/Time   COLORURINE YELLOW 09/04/2017 1750   APPEARANCEUR CLEAR 09/04/2017 1750   LABSPEC 1.010 09/04/2017 1750   PHURINE 7.0 09/04/2017 1750   GLUCOSEU NEGATIVE 09/04/2017 1750   HGBUR SMALL (A) 09/04/2017 1750   HGBUR negative 12/11/2009 1356   BILIRUBINUR NEGATIVE 09/04/2017 1750   BILIRUBINUR MODERATE 06/05/2015 1407   KETONESUR 5 (A) 09/04/2017 1750   PROTEINUR NEGATIVE 09/04/2017 1750   UROBILINOGEN 2.0 06/05/2015 1407   UROBILINOGEN 1.0 12/30/2014 1830   NITRITE NEGATIVE 09/04/2017 1750   LEUKOCYTESUR NEGATIVE 09/04/2017 1750   Sepsis Labs: @LABRCNTIP (procalcitonin:4,lacticidven:4)  )No results found for this or any previous visit (from the past 240  hour(s)).    Studies: No results found.  Scheduled Meds: . amiodarone  200 mg Oral Daily  . aspirin EC  81 mg Oral Daily  . atorvastatin  40 mg Oral Daily  . divalproex  250 mg Oral TID  . heparin  5,000 Units Subcutaneous Q8H  . levETIRAcetam  500 mg Oral BID  . mometasone-formoterol  2 puff Inhalation BID  . pantoprazole  40 mg Oral Daily  . predniSONE  20 mg Oral Q breakfast  . risperiDONE  1 mg Oral BID  . Vitamin D (Ergocalciferol)  50,000 Units Oral Weekly    Continuous Infusions:   LOS: 0 days     Cristal Deer, MD [facility]  To reach me or the doctor on call, go to: [email or web address] Password [facility]  09/18/2017, 5:54 PM

## 2017-09-18 NOTE — Progress Notes (Signed)
Pt states he takes Oxybutynin 5mg  TID, messaged MD, do not have order.   Eleanora Neighbor, RN

## 2017-09-18 NOTE — Care Management Note (Signed)
Case Management Note  Patient Details  Name: Patrick Holt MRN: [id number] Date of Birth: July 21, 1950  Subjective/Objective:                 Patient in observation after fall.    Action/Plan:  Spoke w patient at bedside. He is from home alone. He states that he has an Engineer, production from [employer] MWF that assists with house chores and ADLS. He states that he currently does not have HH. He has cane, RW, and WC at home. He uses [facility] for transportation and his medications are delivered to home through mail order.  CM will continue to follow for home needs PCP- [facility] Expected Discharge Date:                  Expected Discharge Plan:     In-House Referral:     Discharge planning Services  CM Consult  Post Acute Care Choice:    Choice offered to:     DME Arranged:    DME Agency:     HH Arranged:    HH Agency:     Status of Service:  In process, will continue to follow  If discussed at Long Length of Stay Meetings, dates discussed:    Additional Comments:  Carles Collet, RN 09/18/2017, 11:24 AM

## 2017-09-19 ENCOUNTER — Other Ambulatory Visit: Payer: Self-pay

## 2017-09-19 DIAGNOSIS — I10 Essential (primary) hypertension: Secondary | ICD-10-CM

## 2017-09-19 DIAGNOSIS — I480 Paroxysmal atrial fibrillation: Secondary | ICD-10-CM

## 2017-09-19 DIAGNOSIS — S0083XA Contusion of other part of head, initial encounter: Secondary | ICD-10-CM

## 2017-09-19 DIAGNOSIS — J410 Simple chronic bronchitis: Secondary | ICD-10-CM

## 2017-09-19 DIAGNOSIS — W19XXXA Unspecified fall, initial encounter: Secondary | ICD-10-CM

## 2017-09-19 DIAGNOSIS — E860 Dehydration: Secondary | ICD-10-CM

## 2017-09-19 DIAGNOSIS — G40909 Epilepsy, unspecified, not intractable, without status epilepticus: Secondary | ICD-10-CM

## 2017-09-19 DIAGNOSIS — G9341 Metabolic encephalopathy: Secondary | ICD-10-CM

## 2017-09-19 DIAGNOSIS — R296 Repeated falls: Secondary | ICD-10-CM

## 2017-09-19 DIAGNOSIS — Z8669 Personal history of other diseases of the nervous system and sense organs: Secondary | ICD-10-CM

## 2017-09-19 DIAGNOSIS — N179 Acute kidney failure, unspecified: Secondary | ICD-10-CM

## 2017-09-19 DIAGNOSIS — M3390 Dermatopolymyositis, unspecified, organ involvement unspecified: Secondary | ICD-10-CM

## 2017-09-19 LAB — CBC
HCT: 38.7 % — ABNORMAL LOW (ref 39.0–52.0)
Hemoglobin: 11.8 g/dL — ABNORMAL LOW (ref 13.0–17.0)
MCH: 27.3 pg (ref 26.0–34.0)
MCHC: 30.5 g/dL (ref 30.0–36.0)
MCV: 89.4 fL (ref 78.0–100.0)
PLATELETS: 159 10*3/uL (ref 150–400)
RBC: 4.33 MIL/uL (ref 4.22–5.81)
RDW: 20.3 % — ABNORMAL HIGH (ref 11.5–15.5)
WBC: 9.1 10*3/uL (ref 4.0–10.5)

## 2017-09-19 MED ORDER — ENSURE ENLIVE PO LIQD
237.0000 mL | Freq: Two times a day (BID) | ORAL | Status: DC
  Administered 2017-09-19 – 2017-09-20 (×3): 237 mL via ORAL

## 2017-09-19 MED ORDER — PREDNISONE 10 MG PO TABS
10.0000 mg | ORAL_TABLET | Freq: Every day | ORAL | Status: DC
  Administered 2017-09-20: 10 mg via ORAL
  Filled 2017-09-19: qty 1

## 2017-09-19 NOTE — Progress Notes (Signed)
PROGRESS NOTE  Patrick Holt  NUU:[id number] DOB: [birth year] DOA: 09/17/2017 PCP: Medicine, [facility]   Brief Narrative: Patrick Holt is a 67 y.o. male with a history of dermatomyositis, diverticulosis, COPD, tobacco use, fibromyalgia, GERD, HTN, PAF, frequent falls, chronic HFpEF who presented from home after falling and staying down for 12 hours. He had recently been admitted with fall, generalized weakness and UTI, discharged to SNF from which he signed out against medical advice, and had been home less than a week. On presentation by EMS he was dehydrated, hypotensive, with acute renal failure and metabolic encephalopathy. He was given IV fluids and admitted with general improvement.    Assessment & Plan: Principal Problem:   ARF (acute renal failure) (HCC) Active Problems:   Tobacco abuse   COPD (chronic obstructive pulmonary disease) (HCC)   Dermatomyositis (HCC)   History of seizure disorder   Dehydration   Essential hypertension, benign   Severe dehydration   Pulmonary artery hypertension (HCC)   PAF (paroxysmal atrial fibrillation) (HCC)   Frequent falls   Seizure disorder (HCC)   Acute metabolic encephalopathy  Deconditioning, failure to thrive, recurrent falls: Not a safe discharge to home. Pt agrees to SNF. CT head, C-spine without acute findings. - Stable for discharge to SNF.   Hypotension: Due to dehydration, possibly medication related as well. ?Relative adrenal insufficiency - Slow taper off steroids - Able to take po, so will DC IVF's  Nontraumatic rhabdomyolysis: Mild. CK 550 > 278.   Dermatomyositis: Recently put on steroids though was not adherent to these. This does not appear to be an active issue.  - Taper steroids as BP will allow  Acute renal failure: Cr 1.76 > 0.85 with IV fluids most consistent with dehydration, mild rhabdo.  - Monitor again in next week.  COPD, tobacco use:  - Cessation counseling, nicotine patch, home nebs  continued.  Chronic HFpEF: Echo showed EF 60% in Nov 2018. BNP wnl, volume down at admission.  - Holding lasix  Paroxysmal AFib: Currently in NSR.  - Continue amiodarone - Not on anticoagulation due to ongoing falls.  RLE DVT:  - Not on anticoagulation due to high risk of falls. Provider at last admission spoke with vascular surgeon on-call Dr. Vallarie Mare (09/04/2017) who recommended following the patient with repeat ultrasound in 1 month as outpatient and does not recommend anticoagulation given history of subdural hematoma, multiple falls, and location of DVT or (small below the knee).  Chronic hypoxic respiratory failure: Sent home on oxygen last admission.  - Continue supplemental O2.   Anemia: True hgb felt to be very mildly low at 11.8. Previous draw likely falsely dilute with all cell lines down. No active bleeding. - Monitor in next week.   CAD: No chest pain - Continue ASA, statin. Bradycardia limiting use of beta blocker.  Sinus bradycardia: Maximum sinus pause ~1.5 seconds.  Acute metabolic encephalopathy: Resolved  Seizure disorder: No evidence/sequelae of seizure, and pt recalls fall at home. - Continue home AEDs.  - Continuing home tramadol (maximum 200mg /day) despite sz history as benefits outweigh risks with chronic use and controlled seizures. Need him to be able to participate in PT.  GERD:  - PPI  Dyslipidemia: - Resume statin  Code Status: Full Family Communication: None at bedside, none to call Disposition Plan: SNF when insurance authorization obtained  Consultants:   None  Procedures:   None  Antimicrobials:  None   Subjective: Feeling less weak. No chest pain or dyspnea. No  wheezing.   Objective: Vitals:   09/18/17 2023 09/19/17 0446 09/19/17 0713 09/19/17 0844  BP: 102/61 (!) 111/59  (!) 103/53  Pulse: (!) 54 (!) 51  (!) 54  Resp: 12   18  Temp: 97.8 F (36.6 C) (!) 97.5 F (36.4 C)  98 F (36.7 C)  TempSrc: Axillary Axillary  Oral    SpO2: 97% 97% 95% 97%    Gen: 67 y.o. male in no distress  Pulm: Non-labored breathing. Clear to auscultation bilaterally.  CV: Regular rate and rhythm. No murmur, rub, or gallop. No JVD, 1+ pedal edema. GI: Abdomen soft, non-tender, non-distended, with normoactive bowel sounds. No organomegaly or masses felt. Ext: Warm, no deformities Skin: No rashes, lesions or ulcers on visualized skin. Small ecchymosis on left forearm. Neuro: Alert and oriented. No focal neurological deficits. Psych: Judgement and insight appear marginal. Mood & affect appropriate. No hallucinations or internal stimuli apparent.  Data Reviewed: I have personally reviewed following labs and imaging studies  CBC: Recent Labs  Lab 09/17/17 1201 09/18/17 0413 09/19/17 0733  WBC 8.7 5.0 9.1  NEUTROABS 5.3  --   --   HGB 13.0 8.7* 11.8*  HCT 42.5 29.2* 38.7*  MCV 88.0 90.7 89.4  PLT 221 144* 706   Basic Metabolic Panel: Recent Labs  Lab 09/17/17 1201 09/18/17 0413  NA 143 141  K 3.9 3.9  CL 105 108  CO2 28 20*  GLUCOSE 82 54*  BUN 25* 14  CREATININE 1.76* 0.85  CALCIUM 8.9 7.6*  MG 2.0  --    GFR: Estimated Creatinine Clearance: 89.9 mL/min (by C-G formula based on SCr of 0.85 mg/dL). Liver Function Tests: Recent Labs  Lab 09/18/17 0413  AST 14*  ALT 9  ALKPHOS 30*  BILITOT 0.5  PROT 3.4*  ALBUMIN 1.7*   No results for input(s): LIPASE, AMYLASE in the last 168 hours. No results for input(s): AMMONIA in the last 168 hours. Coagulation Profile: No results for input(s): INR, PROTIME in the last 168 hours. Cardiac Enzymes: Recent Labs  Lab 09/17/17 1201 09/18/17 0413  CKTOTAL 550* 278  TROPONINI <0.03  --    BNP (last 3 results) No results for input(s): PROBNP in the last 8760 hours. HbA1C: No results for input(s): HGBA1C in the last 72 hours. CBG: Recent Labs  Lab 09/17/17 1151 09/17/17 1822 09/17/17 2207 09/18/17 0953  GLUCAP 228* 66* 95 132*   Lipid Profile: No results  for input(s): CHOL, HDL, LDLCALC, TRIG, CHOLHDL, LDLDIRECT in the last 72 hours. Thyroid Function Tests: Recent Labs    09/17/17 1934  TSH 0.756   Anemia Panel: No results for input(s): VITAMINB12, FOLATE, FERRITIN, TIBC, IRON, RETICCTPCT in the last 72 hours. Urine analysis:    Component Value Date/Time   COLORURINE YELLOW 09/04/2017 1750   APPEARANCEUR CLEAR 09/04/2017 1750   LABSPEC 1.010 09/04/2017 1750   PHURINE 7.0 09/04/2017 1750   GLUCOSEU NEGATIVE 09/04/2017 1750   HGBUR SMALL (A) 09/04/2017 1750   HGBUR negative 12/11/2009 1356   BILIRUBINUR NEGATIVE 09/04/2017 1750   BILIRUBINUR MODERATE 06/05/2015 1407   KETONESUR 5 (A) 09/04/2017 1750   PROTEINUR NEGATIVE 09/04/2017 1750   UROBILINOGEN 2.0 06/05/2015 1407   UROBILINOGEN 1.0 12/30/2014 1830   NITRITE NEGATIVE 09/04/2017 1750   LEUKOCYTESUR NEGATIVE 09/04/2017 1750   No results found for this or any previous visit (from the past 240 hour(s)).    Radiology Studies: No results found.  Scheduled Meds: . amiodarone  200 mg Oral Daily  .  aspirin EC  81 mg Oral Daily  . atorvastatin  40 mg Oral Daily  . divalproex  250 mg Oral TID  . feeding supplement (ENSURE ENLIVE)  237 mL Oral BID BM  . heparin  5,000 Units Subcutaneous Q8H  . levETIRAcetam  500 mg Oral BID  . mometasone-formoterol  2 puff Inhalation BID  . pantoprazole  40 mg Oral Daily  . predniSONE  20 mg Oral Q breakfast  . risperiDONE  1 mg Oral BID  . Vitamin D (Ergocalciferol)  50,000 Units Oral Weekly   Continuous Infusions:   LOS: 0 days   Time spent: 25 minutes.  Patrecia Pour, MD [facility] [email or web address] Password [email or web address] 09/19/2017, 4:17 PM

## 2017-09-19 NOTE — Evaluation (Signed)
Occupational Therapy Evaluation Patient Details Name: Patrick Holt MRN: [id number] DOB: 1950-04-16 Today's Date: 09/19/2017    History of Present Illness  67 y.o. male with a known history of COPD, HTN, HLD, CAD,  Atrial fib, bipolar disorder, seizure disorder, dermatomyositis/ RA, CVA presents to the emergency department for evaluation of weakness, body aches.    Clinical Impression   Pt with decline in function and safety with ADLs and ADL mobility with decreased strength, balance, endurance and coordination. Pt with hx of B UE tremors and falls. Pt requires extensive assist with ADLs/selfcare at this time. Pt would benefit from acute OT services to address impairments to maximize level of function and safety    Follow Up Recommendations  SNF;Supervision/Assistance - 24 hour    Equipment Recommendations  Other (comment)(TBD at SNF)    Recommendations for Other Services       Precautions / Restrictions Precautions Precautions: Fall Precaution Comments: mulitple falls in past year; most recent 1 month ago; monitor HR  Restrictions Weight Bearing Restrictions: No      Mobility Bed Mobility Overal bed mobility: Needs Assistance Bed Mobility: Supine to Sit;Sit to Supine;Rolling Rolling: Mod assist   Supine to sit: Max assist Sit to supine: Mod assist   General bed mobility comments: assist to raise trunk, advance BLEs, and to scoot hips towards EOB; assist to bring LEs into bed  Transfers Overall transfer level: Needs assistance Equipment used: Rolling walker (2 wheeled) Transfers: Sit to/from Stand Sit to Stand: Mod assist         General transfer comment: Movements are especially slow today, bradykinesia; mod assist to power up to stand    Balance Overall balance assessment: Needs assistance;History of Falls Sitting-balance support: Feet supported Sitting balance-Leahy Scale: Fair Sitting balance - Comments: slight lateral lean with dynamic activity, pt able  to correct himself with verbal cues Postural control: Left lateral lean Standing balance support: Bilateral upper extremity supported Standing balance-Leahy Scale: Poor                             ADL either performed or assessed with clinical judgement   ADL Overall ADL's : Needs assistance/impaired   Eating/Feeding Details (indicate cue type and reason): due to tremors, may benefit from adaptive utensils  Grooming: Sitting;Wash/dry face;Min guard;Wash/dry Nurse, mental health Details (indicate cue type and reason): sitting EOB  Upper Body Bathing: Moderate assistance   Lower Body Bathing: Maximal assistance;Sit to/from stand   Upper Body Dressing : Moderate assistance   Lower Body Dressing: Total assistance   Toilet Transfer: Moderate assistance;RW;Stand-pivot Toilet Transfer Details (indicate cue type and reason): simulated Toileting- Clothing Manipulation and Hygiene: Total assistance;Sit to/from stand       Functional mobility during ADLs: Rolling walker;Moderate assistance       Vision Patient Visual Report: No change from baseline       Perception     Praxis      Pertinent Vitals/Pain Pain Assessment: Faces Pain Score: 5  Pain Location: back Pain Descriptors / Indicators: Grimacing;Guarding;Aching Pain Intervention(s): Limited activity within patient's tolerance;Monitored during session;Repositioned     Hand Dominance Right   Extremity/Trunk Assessment Upper Extremity Assessment Upper Extremity Assessment: Generalized weakness;RUE deficits/detail;LUE deficits/detail RUE Deficits / Details: limited AROM due to weakness, grossly 3-/5, tremors noted  LUE Deficits / Details: tremors, more pronounced in R UE   Lower Extremity Assessment Lower Extremity Assessment: Defer to PT evaluation  Communication Communication Communication: No difficulties   Cognition Arousal/Alertness: Awake/alert Behavior During Therapy: WFL for tasks  assessed/performed Overall Cognitive Status: Within Functional Limits for tasks assessed                                 General Comments: pt reports STM deficits at baseline   General Comments       Exercises     Shoulder Instructions      Home Living Family/patient expects to be discharged to:: Unsure Living Arrangements: Alone Available Help at Discharge: Personal care attendant Type of Home: Apartment Home Access: Elevator     Home Layout: One level     Bathroom Shower/Tub: Occupational psychologist: Standard     Home Equipment: Environmental consultant - 2 wheels;Cane - single point;Electric scooter   Additional Comments: Ship broker      Prior Functioning/Environment Level of Independence: Engineer, materials / Transfers Assistance Needed: amb short distances with RW and uses scooter for longer distances--was unable to get out of bed for 2 days prior to admission ADL's / Homemaking Assistance Needed: aide assists with bathing/dressing; sponge bathes   Comments: aide comes 3x/week        OT Problem List: Decreased strength;Impaired balance (sitting and/or standing);Decreased activity tolerance;Decreased knowledge of use of DME or AE;Decreased range of motion;Cardiopulmonary status limiting activity;Pain;Decreased coordination      OT Treatment/Interventions: Self-care/ADL training;DME and/or AE instruction;Therapeutic activities;Balance training;Patient/family education;Energy conservation;Therapeutic exercise    OT Goals(Current goals can be found in the care plan section) Acute Rehab OT Goals Patient Stated Goal: none stated OT Goal Formulation: With patient Time For Goal Achievement: 10/03/17 Potential to Achieve Goals: Good ADL Goals Pt Will Perform Grooming: with supervision;with set-up;sitting Pt Will Perform Upper Body Bathing: with min assist;sitting Pt Will Perform Lower Body Bathing: with mod assist;sitting/lateral  leans Pt Will Perform Upper Body Dressing: with min assist;sitting Pt Will Transfer to Toilet: with min assist;ambulating;stand pivot transfer;bedside commode Pt Will Perform Toileting - Clothing Manipulation and hygiene: with min assist;sit to/from stand  OT Frequency: Min 2X/week   Barriers to D/C: Inaccessible home environment          Co-evaluation              AM-PAC PT "6 Clicks" Daily Activity     Outcome Measure Help from another person eating meals?: A Little Help from another person taking care of personal grooming?: A Little Help from another person toileting, which includes using toliet, bedpan, or urinal?: Total Help from another person bathing (including washing, rinsing, drying)?: A Lot Help from another person to put on and taking off regular upper body clothing?: A Lot Help from another person to put on and taking off regular lower body clothing?: Total 6 Click Score: 12   End of Session Equipment Utilized During Treatment: Rolling walker;Oxygen;Gait belt  Activity Tolerance: Patient limited by fatigue Patient left: in bed;with call bell/phone within reach  OT Visit Diagnosis: Muscle weakness (generalized) (M62.81);Unsteadiness on feet (R26.81);Pain;Other abnormalities of gait and mobility (R26.89);History of falling (Z91.81) Pain - part of body: (back)                Time: 1026-1050 OT Time Calculation (min): 24 min Charges:  OT General Charges $OT Visit: 1 Visit OT Evaluation $OT Eval Moderate Complexity: 1 Mod    Britt Bottom 09/19/2017, 1:29 PM

## 2017-09-19 NOTE — NC FL2 (Signed)
[place] LEVEL OF CARE SCREENING TOOL     IDENTIFICATION  Patient Name: Patrick Holt Birthdate: 1950/05/29 Sex: male Admission Date (Current Location): 09/17/2017  [facility] and [place] Number:  Herbalist and Address:  [facility] [place] [place] [address], [place], [place] [address]      Provider Number: [id number]  Attending Physician Name and Address:  Patrecia Pour, MD  Relative Name and Phone Number:  [name] - friend; [phone].(Patient reports no family. Has a friend that patient indicated could be contacted)    Current Level of Care:   Recommended Level of Care: [place] Prior Approval Number:    Date Approved/Denied:   PASRR Number: [id number] E(Eff. 7/23 - 10/06/17)  Discharge Plan: SNF    Current Diagnoses: Patient Active Problem List   Diagnosis Date Noted  . ARF (acute renal failure) ([place]) 09/17/2017  . Unstable angina ([place]) 06/16/2017  . L1 and L2 vertebral fracture ([place]) 04/06/2017  . Multiple rib fractures 04/06/2017  . Evaluation by psychiatric service required 03/07/2017  . Acute metabolic encephalopathy 88/91/6945  . Hypoglycemia 03/04/2017  . Acute respiratory failure with hypoxia ([place]) 07/24/2017  . Acute encephalopathy 07/24/2017  . Rhabdomyolysis 02/05/2017  . Acute respiratory failure with hypoxia and hypercapnia ([place]) 02/05/2017  . COPD with acute exacerbation ([place]) 02/05/2017  . Bipolar 1 disorder ([place]) 02/05/2017  . Anemia 02/05/2017  . Hypertension 02/05/2017  . Hand fracture, right 02/05/2017  . Narcotic dependence ([place]) 02/05/2017  . Pre-diabetes 02/05/2017  . PAF (paroxysmal atrial fibrillation) ([place]) 02/05/2017  . Frequent falls 02/05/2017  . History of dermatomyositis 02/05/2017  . Seizure disorder ([place]) 02/05/2017  . Bilateral lower extremity edema 02/05/2017  . Pulmonary artery hypertension ([place]) 01/15/2017  . Altered mental status 01/13/2017  . Low oxygen  saturation   . Pain in right wrist 02/27/2016  . Altered mental state 11/14/2015  . Severe dehydration 11/14/2015  . Hypernatremia 11/14/2015  . Difficulty in walking, not elsewhere classified   . Right wrist fracture   . Closed displaced fracture of ulnar styloid with routine healing   . Scapholunate ligament injury with no instability   . Falls frequently   . Hypoxia 11/02/2015  . Anxiety 06/06/2015  . Impaired mobility 04/28/2015  . Lumbar spondylosis with myelopathy 03/07/2015  . Nocturia 12/03/2014  . Chronic venous insufficiency 09/23/2014  . Poor social situation 07/30/2014  . Schizophrenia, acute ([place])   . Schizoaffective disorder, bipolar type ([place])   . Decreased visual acuity 06/27/2014  . COPD exacerbation ([place])   . Right sided weakness 01/04/2014  . Essential hypertension, benign   . Atrial fibrillation, unspecified   . Radiculopathy of cervical region   . Polyuria 12/07/2013  . Erectile dysfunction 12/07/2013  . Diverticulitis 08/01/2013  . Acute diverticulitis 08/01/2013  . Hematochezia 06/04/2013  . Dehydration 11/09/2012  . Hypokalemia 10/03/2012  . Benign neoplasm of colon 09/27/2012  . Stricture and stenosis of esophagus 09/27/2012  . Diverticulitis of colon without hemorrhage 07/26/2012  . Polypharmacy 03/26/2012  . Seizures ([place]) 02/10/2012  . Atrial fibrillation ([place]) 02/05/2012  . Coronary atherosclerosis of native coronary artery 02/05/2012  . Radicular low back pain 03/09/2011  . History of seizure disorder 01/29/2011  . Irritable bowel syndrome (IBS) 06/24/2010  . Urge incontinence 06/19/2010  . Tobacco abuse 11/23/2008  . DEPRESSION, MAJOR 05/31/2008  . ISCHEMIC HEART DISEASE 05/31/2008  . RAYNAUD'S DISEASE 05/31/2008  . HIATAL HERNIA 05/31/2008  . Rheumatoid arthritis ([place]) 05/31/2008  .  FIBROMYALGIA 05/31/2008  . Hyperlipidemia 01/26/2008  . Anxiety state 01/26/2008  . HEMORRHOIDS, INTERNAL 01/26/2008  . COPD (chronic obstructive  pulmonary disease) ([place]) 01/26/2008  . ESOPHAGEAL MOTILITY DISORDER 01/26/2008  . GERD 01/26/2008  . Dermatomyositis ([place]) 01/26/2008    Orientation RESPIRATION BLADDER Height & Weight     Self, Time, Situation, Place  O2(3 Liters Oxygen) External catheter(Catheter placed 8/3) Weight:   Height:     BEHAVIORAL SYMPTOMS/MOOD NEUROLOGICAL BOWEL NUTRITION STATUS    Convulsions/Seizures(History of seizures) Continent Diet(DYS 1)  AMBULATORY STATUS COMMUNICATION OF NEEDS Skin   Total Care(Patient was unable to ambulate with PT on 8/4 due to weakness) Verbally Other (Comment)(Stage 2 pressure ulcer to buttocks)                       Personal Care Assistance Level of Assistance  Bathing, Feeding, Dressing Bathing Assistance: Maximum assistance Feeding assistance: Independent(May need assistance with set-up) Dressing Assistance: Maximum assistance     Functional Limitations Info  Sight, Hearing, Speech Sight Info: Adequate Hearing Info: Adequate Speech Info: Adequate    SPECIAL CARE FACTORS FREQUENCY  PT (By licensed PT)     PT Frequency: Evaluated 8/4 and a minimum of 2X per week therapy recommended during acute inpatient stay              Contractures Contractures Info: Not present    Additional Factors Info  Code Status, Allergies Code Status Info: Full Allergies Info: Immune Globulins, Ciprofloxacin, Flagyl, Metronidazole, Lisinopril, Sulfa Antibiotics, Cefepime, Morphine And Related, Requip Ropinirole Hcl, Vancomycin, Betamethasone Dipropionate, Bupropion Hcl, Clobetasol, Codeine, Escitalopram Oxalate, Fluoxetine Hcl, Meclizine, Paroxetine, Penicillins, Tacrolimus, Tetanus Toxoid, Tuberculin Purified Protein Derivative             Current Medications (09/19/2017):  This is the current hospital active medication list Current Facility-Administered Medications  Medication Dose Route Frequency Provider Last Rate Last Dose  . amiodarone (PACERONE) tablet 200 mg  200  mg Oral Daily Thurnell Lose, MD   200 mg at 09/19/17 1007  . aspirin EC tablet 81 mg  81 mg Oral Daily Thurnell Lose, MD   81 mg at 09/19/17 1007  . atorvastatin (LIPITOR) tablet 40 mg  40 mg Oral Daily Thurnell Lose, MD   40 mg at 09/19/17 1008  . bisacodyl (DULCOLAX) suppository 10 mg  10 mg Rectal Daily PRN Thurnell Lose, MD      . divalproex (DEPAKOTE) DR tablet 250 mg  250 mg Oral TID Thurnell Lose, MD   250 mg at 09/19/17 1008  . feeding supplement (ENSURE ENLIVE) (ENSURE ENLIVE) liquid 237 mL  237 mL Oral BID BM Vance Gather B, MD      . heparin injection 5,000 Units  5,000 Units Subcutaneous Q8H Thurnell Lose, MD   5,000 Units at 09/19/17 0541  . levETIRAcetam (KEPPRA) tablet 500 mg  500 mg Oral BID Thurnell Lose, MD   500 mg at 09/19/17 1008  . mometasone-formoterol (DULERA) 200-5 MCG/ACT inhaler 2 puff  2 puff Inhalation BID Thurnell Lose, MD   2 puff at 09/19/17 0711  . pantoprazole (PROTONIX) EC tablet 40 mg  40 mg Oral Daily Thurnell Lose, MD   40 mg at 09/19/17 1008  . predniSONE (DELTASONE) tablet 20 mg  20 mg Oral Q breakfast Thurnell Lose, MD   20 mg at 09/19/17 1011  . risperiDONE (RISPERDAL) tablet 1 mg  1 mg Oral BID Thurnell Lose, MD  1 mg at 09/19/17 1008  . traMADol (ULTRAM) tablet 50 mg  50 mg Oral Q6H PRN Thurnell Lose, MD      . Vitamin D (Ergocalciferol) (DRISDOL) capsule 50,000 Units  50,000 Units Oral Weekly Thurnell Lose, MD         Discharge Medications: Please see discharge summary for a list of discharge medications.  Relevant Imaging Results:  Relevant Lab Results:   Additional Information ss#[id number]  Sable Feil, LCSW

## 2017-09-19 NOTE — Progress Notes (Signed)
Initial Nutrition Assessment  DOCUMENTATION CODES:   Morbid obesity  INTERVENTION:   Ensure Enlive po BID, each supplement provides 350 kcal and 20 grams of protein  Feeding assistance at meal times  Request measured weight as no weight since admission   NUTRITION DIAGNOSIS:   Inadequate oral intake related to poor appetite as evidenced by meal completion < 50%.  GOAL:   Patient will meet greater than or equal to 90% of their needs  MONITOR:   PO intake, Supplement acceptance, Labs, Weight trends  REASON FOR ASSESSMENT:   Malnutrition Screening Tool    ASSESSMENT:    67 yo male admitted with recurrent falls at home with over 12 hours being on floor unattended with severe dehydration, mild rhabdomyolosis and ARF.  Pt with hx of dermatomyositis, diverticulosis, COPD, fibromyalgia, GERD, HTN, afib, CHF  Recorded po intake  38% on average. Ate 25% at breakfast this AM. Pt reports appetite is fair.   Pt denies weight loss; need new weight this admission. Pt confirms he is 5 foot 3 inches tall. Weight of 233 pounds from 7/22. If weight from 7/22 is correct; pt does not appear to have lost wt recently per weight encounters as far back as 2017. Noted pt with moderate edema at present so weight will be skewed.   Pt with tremors and requires assistance at meal times; pt verbalizes he was assisted at breakfast this AM. Orders for feeding assistance placed.   Labs: Creatinine wdl, CBGs 66-228 Meds: prednisone, Vitamin D  NUTRITION - FOCUSED PHYSICAL EXAM:    Most Recent Value  Orbital Region  Moderate depletion  Upper Arm Region  No depletion  Thoracic and Lumbar Region  No depletion  Buccal Region  Mild depletion  Temple Region  Moderate depletion  Clavicle Bone Region  Mild depletion  Clavicle and Acromion Bone Region  No depletion  Scapular Bone Region  No depletion  Dorsal Hand  Mild depletion  Patellar Region  Unable to assess [edema]  Anterior Thigh Region  Unable  to assess  Posterior Calf Region  Unable to assess [edema]  Edema (RD Assessment)  Moderate       Diet Order:   Diet Order           DIET - DYS 1 Room service appropriate? Yes; Fluid consistency: Thin  Diet effective now          EDUCATION NEEDS:   Education needs have been addressed  Skin:  Skin Assessment: Skin Integrity Issues: Skin Integrity Issues:: Stage II Stage II: buttock  Last BM:  7/31  Height:   Ht Readings from Last 1 Encounters:  09/04/17 5\' 3"  (1.6 m)    Weight:   Wt Readings from Last 1 Encounters:  09/07/17 233 lb (105.7 kg)    Ideal Body Weight:  56.4 kg  BMI:  BMI 41.28  Estimated Nutritional Needs:   Kcal:  1650-1800 kcals   Protein:  85-105 g  Fluid:  >/= 1.7 L    [employer] MS, RD, LDN, CNSC [phone] Pager  [phone] Weekend/On-Call Pager

## 2017-09-20 DIAGNOSIS — N179 Acute kidney failure, unspecified: Secondary | ICD-10-CM | POA: Diagnosis not present

## 2017-09-20 DIAGNOSIS — Z720 Tobacco use: Secondary | ICD-10-CM

## 2017-09-20 DIAGNOSIS — G9341 Metabolic encephalopathy: Secondary | ICD-10-CM | POA: Diagnosis not present

## 2017-09-20 DIAGNOSIS — S0083XA Contusion of other part of head, initial encounter: Secondary | ICD-10-CM | POA: Diagnosis not present

## 2017-09-20 DIAGNOSIS — J410 Simple chronic bronchitis: Secondary | ICD-10-CM | POA: Diagnosis not present

## 2017-09-20 MED ORDER — ALPRAZOLAM 0.5 MG PO TABS: 0.5000 mg | ORAL_TABLET | Freq: Three times a day (TID) | ORAL | 0 refills | Status: DC

## 2017-09-20 MED ORDER — POTASSIUM CHLORIDE ER 20 MEQ PO TBCR: 20.0000 meq | EXTENDED_RELEASE_TABLET | ORAL | Status: AC

## 2017-09-20 MED ORDER — PREDNISONE 10 MG PO TABS: 10.0000 mg | ORAL_TABLET | Freq: Every day | ORAL | Status: DC

## 2017-09-20 NOTE — Progress Notes (Signed)
Patrick Holt to be D/C'd Skilled nursing facility per MD order.  Discussed prescriptions and follow up appointments with the patient. Prescriptions given to patient, medication list explained in detail. Pt verbalized understanding.  Allergies as of 09/20/2017      Reactions   Immune Globulins Other (See Comments)   Acute renal failure   Ciprofloxacin Swelling   Flagyl [metronidazole] Swelling   Lisinopril Diarrhea   Sulfa Antibiotics Other (See Comments)   blisters   Cefepime Other (See Comments)   Diffuse maculopapular rash when receiving both vanc and cefepime, timing unclear.   Morphine And Related Other (See Comments)   Ineffective for pain management   Requip [ropinirole Hcl] Other (See Comments)   constipation   Vancomycin    Diffuse maculopapular rash when receiving both vanc/cefepime.  Timing unclear.     Betamethasone Dipropionate Other (See Comments)   Unknown   Bupropion Hcl Other (See Comments)   Unknown   Clobetasol Other (See Comments)   Unknown   Codeine Other (See Comments)   Does not help with pain   Escitalopram Oxalate Other (See Comments)   Unknown   Fluoxetine Hcl Other (See Comments)   Unknown   Meclizine Rash   Paroxetine Other (See Comments)   Unknown   Penicillins Other (See Comments)   Unknown reaction Has patient had a PCN reaction causing immediate rash, facial/tongue/throat swelling, SOB or lightheadedness with hypotension: unknown Has patient had a PCN reaction causing severe rash involving mucus membranes or skin necrosis: unknown Has patient had a PCN reaction that required hospitalization unknown Has patient had a PCN reaction occurring within the last 10 years: unknown If all of the above answers are "NO", then may proceed with Cephalosporin use. Unknown Unknown reactio   Tacrolimus Other (See Comments)   Unknown   Tetanus Toxoid Other (See Comments)   Unknown   Tuberculin Purified Protein Derivative Other (See Comments)   Unknown       Medication List    STOP taking these medications   amLODipine 5 MG tablet Commonly known as:  NORVASC     TAKE these medications   ALPRAZolam 0.5 MG tablet Commonly known as:  XANAX Take 1 tablet (0.5 mg total) by mouth 3 (three) times daily.   amiodarone 200 MG tablet Commonly known as:  PACERONE Take 1 tablet (200 mg total) by mouth daily. What changed:  how much to take   aspirin EC 81 MG tablet Take 1 tablet (81 mg total) by mouth daily.   atorvastatin 40 MG tablet Commonly known as:  LIPITOR Take 1 tablet (40 mg total) by mouth daily.   budesonide-formoterol 160-4.5 MCG/ACT inhaler Commonly known as:  SYMBICORT Inhale 2 puffs into the lungs 2 (two) times daily.   divalproex 250 MG DR tablet Commonly known as:  DEPAKOTE Take 1 tablet (250 mg total) by mouth 3 (three) times daily.   ergocalciferol 50000 units capsule Commonly known as:  VITAMIN D2 Take 1 capsule (50,000 Units total) by mouth once a week.   esomeprazole 40 MG capsule Commonly known as:  NEXIUM Take 1 capsule (40 mg total) by mouth daily.   ipratropium-albuterol 0.5-2.5 (3) MG/3ML Soln Commonly known as:  DUONEB Inhale 3 mLs into the lungs every 6 (six) hours as needed.   levETIRAcetam 500 MG tablet Commonly known as:  KEPPRA Take 1 tablet (500 mg total) by mouth 2 (two) times daily.   loperamide 2 MG capsule Commonly known as:  IMODIUM Take 1 capsule (2  mg total) by mouth as needed for diarrhea or loose stools.   multivitamin with minerals Tabs tablet Take 1 tablet by mouth daily.   oxybutynin 5 MG tablet Commonly known as:  DITROPAN Take 1 tablet (5 mg total) by mouth 3 (three) times daily.   Potassium Chloride ER 20 MEQ Tbcr Take 20 mEq by mouth every other day.   predniSONE 10 MG tablet Commonly known as:  DELTASONE Take 1 tablet (10 mg total) by mouth daily with breakfast. What changed:    medication strength  how much to take   risperiDONE 1 MG tablet Commonly  known as:  RISPERDAL Take 1 tablet (1 mg total) by mouth 2 (two) times daily.   senna-docusate 8.6-50 MG tablet Commonly known as:  Senokot-S Take 1 tablet by mouth at bedtime as needed for mild constipation.       Vitals:   09/20/17 0827 09/20/17 0948  BP:  (!) 101/47  Pulse: (!) 55 66  Resp: 18 18  Temp:  (!) 97.4 F (36.3 C)  SpO2: 90% 91%    Skin clean, dry and intact without evidence of skin break down, no evidence of skin tears noted. IV catheter discontinued intact. Site without signs and symptoms of complications. Dressing and pressure applied. Pt denies pain at this time. No complaints noted.  An After Visit Summary was printed and given to the patient. Patient escorted via [place], and awaiting DC via [facility] to [facility]. Called report to Romilda Garret, RN at the facility.  Emilio Math, RN [facility] Phone [phone]

## 2017-09-20 NOTE — Clinical Social Work Placement (Addendum)
   CLINICAL SOCIAL WORK PLACEMENT  NOTE 09/20/17 - DISCHARGED TO [facility]  Date:  09/20/2017  Patient Details  Name: Patrick Holt MRN: [id number] Date of Birth: [birth year]  Clinical Social Work is seeking post-discharge placement for this patient at the [place] level of care (*CSW will initial, date and re-position this form in  chart as items are completed):  Yes   Patient/family provided with [facility] Work Department's list of facilities offering this level of care within the geographic area requested by the patient (or if unable, by the patient's family).  Yes   Patient/family informed of their freedom to choose among providers that offer the needed level of care, that participate in Medicare, Medicaid or managed care program needed by the patient, have an available bed and are willing to accept the patient.  Yes   Patient/family informed of [facility]'s ownership interest in [facility] and [facility], as well as of the fact that they are under no obligation to receive care at these facilities.  PASRR submitted to EDS on       PASRR number received on       Existing PASRR number confirmed on 09/19/17     FL2 transmitted to all facilities in geographic area requested by pt/family on 09/19/17     FL2 transmitted to all facilities within larger geographic area on       Patient informed that his/her managed care company has contracts with or will negotiate with certain facilities, including the following:        Yes   Patient/family informed of bed offers received.  Patient chooses bed at [facility]     Physician recommends and patient chooses bed at      Patient to be transferred to [facility] on 09/20/17.  Patient to be transferred to facility by Ambulance     Patient family notified on 09/20/17 of transfer.  Name of family member notified:  Lolita Patella [phone] [facility]). Call made to friend [name] - [phone] and VM not set-up.      PHYSICIAN      Additional Comment:  09/20/17 - [facility] received insurance authorization from [facility].    _______________________________________________ Sable Feil, LCSW 09/20/2017, 2:19 PM

## 2017-09-20 NOTE — Progress Notes (Signed)
Attempted to call  report x2, to [facility], no response.   Paulla Fore, RN

## 2017-09-20 NOTE — Progress Notes (Signed)
RT found patient with nasal cannula off and sats at 87%. RT placed oxygen back on patient.

## 2017-09-20 NOTE — Progress Notes (Signed)
Patient discharged to [facility]. Transported by [facility]. Belongings sent with patient. [place], Wonda Cheng, Therapist, sports

## 2017-09-20 NOTE — Discharge Summary (Signed)
Physician Discharge Summary  Patrick Holt LDJ:[id number] DOB: 1950/10/30 DOA: 09/17/2017  PCP: [facility]  Admit date: 09/17/2017 Discharge date: 09/20/2017  Admitted From: Home Disposition: SNF   Recommendations for Outpatient Follow-up:  1. Follow up with PCP in 1-2 weeks 2. Please obtain BMP/CBC in one week 3. Repeat right leg venous doppler U/S due to presence of DVT not on anticoagulation due to severe fall risk.  Home Health: N/A Equipment/Devices: Per SNF Discharge Condition: Stable CODE STATUS: Full Diet recommendation: Heart healthy  Brief/Interim Summary: Patrick Holt is a 67 y.o. male with a history of dermatomyositis, diverticulosis, COPD, tobacco use, fibromyalgia, GERD, HTN, PAF, frequent falls, chronic HFpEF who presented from home after falling and staying down for 12 hours. He had recently been admitted with fall, generalized weakness and UTI, discharged to SNF from which he signed out against medical advice, and had been home less than a week. On presentation by EMS he was dehydrated, hypotensive, with acute renal failure and metabolic encephalopathy. He was given IV fluids and admitted with general improvement. Creatinine improved 1.76 > 0.85, CK normalized 550 > 278, troponin negative and BNP normal. Hemoglobin decreased with IV fluids 13 > 8.7 initially but back to 11.8 on recheck with no signs of active bleeding. He is now amenable to SNF and is stable for discharge.   Discharge Diagnoses:  Principal Problem:   ARF (acute renal failure) (HCC) Active Problems:   Tobacco abuse   COPD (chronic obstructive pulmonary disease) (HCC)   Dermatomyositis (HCC)   History of seizure disorder   Dehydration   Essential hypertension, benign   Severe dehydration   Pulmonary artery hypertension (HCC)   PAF (paroxysmal atrial fibrillation) (HCC)   Frequent falls   Seizure disorder (HCC)   Acute metabolic encephalopathy  Deconditioning, failure  to thrive, recurrent falls: Not a safe discharge to home. Pt agrees to SNF. CT head, C-spine without acute findings. - Stable for discharge to SNF.   Hypotension: Due to dehydration, possibly medication related as well. ?Relative adrenal insufficiency - Slow taper off steroids > prednisone 20mg  daily cut to 10mg  daily. Continue taper based on BPs. - Stop norvasc 5mg  at discharge, monitor. - Able to take po which has increased.  Nontraumatic rhabdomyolysis: Mild. CK 550 > 278.   Dermatomyositis: Recently put on steroids though was not adherent to these. This does not appear to be an active issue.  - Taper steroids as BP will allow  Acute renal failure: Cr 1.76 > 0.85 with IV fluids most consistent with dehydration, mild rhabdo.  - Monitor again in next week.  COPD, tobacco use:  - Cessation counseling, nicotine patch, home nebs continued.  Chronic HFpEF: Echo showed EF 60% in Nov 2018. BNP wnl, volume down at admission.  - Ok to restart lasix now.  Paroxysmal AFib: Currently in NSR.  - Continue amiodarone - Not on anticoagulation due to ongoing falls.  RLE DVT:  - Not on anticoagulation due to high risk of falls. Provider at last admission spoke with vascular surgeon on-call Dr. Vallarie Mare (09/04/2017) who recommended following the patient with repeat ultrasound in 1 month as outpatient and does not recommend anticoagulation given history of subdural hematoma, multiple falls, and location of DVT or (small below the knee).  Chronic hypoxic respiratory failure: Sent home on oxygen last admission.  - Continue supplemental O2.   Anemia: True hgb felt to be very mildly low at 11.8. Previous draw likely falsely dilute with all cell  lines down. No active bleeding. - Monitor in next week.   CAD: No chest pain - Continue ASA, statin. Bradycardia limiting use of beta blocker.  Sinus bradycardia: Maximum sinus pause ~1.5 seconds.  Acute metabolic encephalopathy:  Resolved  Hypokalemia: Continue home potassium every other day.  Seizure disorder: No evidence/sequelae of seizure, and pt recalls fall at home. - Continue home AEDs.   GERD:  - PPI  Dyslipidemia: - Resume statin  Depression, history of schizophrenia: No acute issues. Continue home medications including xanax, risperdal  Discharge Instructions Discharge Instructions    Diet - low sodium heart healthy   Complete by:  As directed    Increase activity slowly   Complete by:  As directed      Allergies as of 09/20/2017      Reactions   Immune Globulins Other (See Comments)   Acute renal failure   Ciprofloxacin Swelling   Flagyl [metronidazole] Swelling   Lisinopril Diarrhea   Sulfa Antibiotics Other (See Comments)   blisters   Cefepime Other (See Comments)   Diffuse maculopapular rash when receiving both vanc and cefepime, timing unclear.   Morphine And Related Other (See Comments)   Ineffective for pain management   Requip [ropinirole Hcl] Other (See Comments)   constipation   Vancomycin    Diffuse maculopapular rash when receiving both vanc/cefepime.  Timing unclear.     Betamethasone Dipropionate Other (See Comments)   Unknown   Bupropion Hcl Other (See Comments)   Unknown   Clobetasol Other (See Comments)   Unknown   Codeine Other (See Comments)   Does not help with pain   Escitalopram Oxalate Other (See Comments)   Unknown   Fluoxetine Hcl Other (See Comments)   Unknown   Meclizine Rash   Paroxetine Other (See Comments)   Unknown   Penicillins Other (See Comments)   Unknown reaction Has patient had a PCN reaction causing immediate rash, facial/tongue/throat swelling, SOB or lightheadedness with hypotension: unknown Has patient had a PCN reaction causing severe rash involving mucus membranes or skin necrosis: unknown Has patient had a PCN reaction that required hospitalization unknown Has patient had a PCN reaction occurring within the last 10 years:  unknown If all of the above answers are "NO", then may proceed with Cephalosporin use. Unknown Unknown reactio   Tacrolimus Other (See Comments)   Unknown   Tetanus Toxoid Other (See Comments)   Unknown   Tuberculin Purified Protein Derivative Other (See Comments)   Unknown      Medication List    STOP taking these medications   amLODipine 5 MG tablet Commonly known as:  NORVASC     TAKE these medications   ALPRAZolam 0.5 MG tablet Commonly known as:  XANAX Take 1 tablet (0.5 mg total) by mouth 3 (three) times daily.   amiodarone 200 MG tablet Commonly known as:  PACERONE Take 1 tablet (200 mg total) by mouth daily. What changed:  how much to take   aspirin EC 81 MG tablet Take 1 tablet (81 mg total) by mouth daily.   atorvastatin 40 MG tablet Commonly known as:  LIPITOR Take 1 tablet (40 mg total) by mouth daily.   budesonide-formoterol 160-4.5 MCG/ACT inhaler Commonly known as:  SYMBICORT Inhale 2 puffs into the lungs 2 (two) times daily.   divalproex 250 MG DR tablet Commonly known as:  DEPAKOTE Take 1 tablet (250 mg total) by mouth 3 (three) times daily.   ergocalciferol 50000 units capsule Commonly known as:  VITAMIN  D2 Take 1 capsule (50,000 Units total) by mouth once a week.   esomeprazole 40 MG capsule Commonly known as:  NEXIUM Take 1 capsule (40 mg total) by mouth daily.   ipratropium-albuterol 0.5-2.5 (3) MG/3ML Soln Commonly known as:  DUONEB Inhale 3 mLs into the lungs every 6 (six) hours as needed.   levETIRAcetam 500 MG tablet Commonly known as:  KEPPRA Take 1 tablet (500 mg total) by mouth 2 (two) times daily.   loperamide 2 MG capsule Commonly known as:  IMODIUM Take 1 capsule (2 mg total) by mouth as needed for diarrhea or loose stools.   multivitamin with minerals Tabs tablet Take 1 tablet by mouth daily.   oxybutynin 5 MG tablet Commonly known as:  DITROPAN Take 1 tablet (5 mg total) by mouth 3 (three) times daily.   Potassium  Chloride ER 20 MEQ Tbcr Take 20 mEq by mouth every other day.   predniSONE 10 MG tablet Commonly known as:  DELTASONE Take 1 tablet (10 mg total) by mouth daily with breakfast. What changed:    medication strength  how much to take   risperiDONE 1 MG tablet Commonly known as:  RISPERDAL Take 1 tablet (1 mg total) by mouth 2 (two) times daily.   senna-docusate 8.6-50 MG tablet Commonly known as:  Senokot-S Take 1 tablet by mouth at bedtime as needed for mild constipation.      Follow-up Information    [facility]. Schedule an appointment as soon as possible for a visit in 1 week(s).   Contact information: [place] [address] [phone]          Allergies  Allergen Reactions  . Immune Globulins Other (See Comments)    Acute renal failure  . Ciprofloxacin Swelling  . Flagyl [Metronidazole] Swelling  . Lisinopril Diarrhea  . Sulfa Antibiotics Other (See Comments)    blisters  . Cefepime Other (See Comments)    Diffuse maculopapular rash when receiving both vanc and cefepime, timing unclear.  . Morphine And Related Other (See Comments)    Ineffective for pain management  . Requip [Ropinirole Hcl] Other (See Comments)    constipation  . Vancomycin     Diffuse maculopapular rash when receiving both vanc/cefepime.  Timing unclear.    . Betamethasone Dipropionate Other (See Comments)    Unknown  . Bupropion Hcl Other (See Comments)    Unknown  . Clobetasol Other (See Comments)    Unknown  . Codeine Other (See Comments)    Does not help with pain  . Escitalopram Oxalate Other (See Comments)    Unknown  . Fluoxetine Hcl Other (See Comments)    Unknown  . Meclizine Rash  . Paroxetine Other (See Comments)    Unknown  . Penicillins Other (See Comments)    Unknown reaction Has patient had a PCN reaction causing immediate rash, facial/tongue/throat swelling, SOB or lightheadedness with hypotension: unknown Has patient had  a PCN reaction causing severe rash involving mucus membranes or skin necrosis: unknown Has patient had a PCN reaction that required hospitalization unknown Has patient had a PCN reaction occurring within the last 10 years: unknown If all of the above answers are "NO", then may proceed with Cephalosporin use. Unknown Unknown reactio  . Tacrolimus Other (See Comments)    Unknown  . Tetanus Toxoid Other (See Comments)    Unknown  . Tuberculin Purified Protein Derivative Other (See Comments)    Unknown    Consultations:  None  Procedures/Studies: Dg Chest 2 View  Result Date: 09/03/2017 CLINICAL DATA:  Generalized weakness.  Hypertension. EXAM: CHEST - 2 VIEW COMPARISON:  Jun 16, 2017 FINDINGS: There is a small pleural effusion on each side with bibasilar atelectatic change. There is no frank edema or consolidation. Heart is upper normal in size with pulmonary vascularity normal. No adenopathy. There is thoracolumbar levoscoliosis. IMPRESSION: Small pleural effusions with bibasilar atelectasis. No frank consolidation. Stable cardiac silhouette. Electronically Signed   By: Lowella Grip III M.D.   On: 09/03/2017 18:21   Ct Head Wo Contrast  Result Date: 09/17/2017 CLINICAL DATA:  Patient fell last evening, now with lethargy. History of stroke. EXAM: CT HEAD WITHOUT CONTRAST CT CERVICAL SPINE WITHOUT CONTRAST TECHNIQUE: Multidetector CT imaging of the head and cervical spine was performed following the standard protocol without intravenous contrast. Multiplanar CT image reconstructions of the cervical spine were also generated. COMPARISON:  Head CT - 09/03/2017; head and C-spine CT-06/16/2017 FINDINGS: CT HEAD FINDINGS Brain: Moderately advanced atrophy with sulcal prominence and centralized volume loss with commensurate ex vacuo dilatation the ventricular system. Periventricular hypodensities compatible microvascular ischemic disease. Given background parenchymal abnormalities, there is no CT  evidence superimposed acute large territory infarct. No intraparenchymal or extra-axial mass or hemorrhage. Unchanged size and configuration of the ventricles and the basilar cisterns. No midline shift. Vascular: Intracranial atherosclerosis. Skull: No displaced calvarial fracture. Sinuses/Orbits: Limited visualization of the paranasal sinuses and mastoid air cells is normal. No air-fluid levels. A small amount of debris is seen within the bilateral external auditory canals. Post left-sided cataract surgery. Other: Regional soft tissues appear normal. CT CERVICAL SPINE FINDINGS Alignment: C1 to the superior endplate of T2 is imaged. Mild scoliotic curvature of the cervicothoracic spine, potentially positional. Otherwise, normal alignment of the cervical spine. No anterolisthesis or retrolisthesis. Bilateral facets are normally aligned. Skull base and vertebrae: The dens is normally positioned between the lateral masses of C1. Mild degenerative change of the atlantodental articulation. Normal atlantoaxial articulations. Cervical vertebral body heights are preserved. Old fracture involving the T1 spinous process (sagittal image 18, series 10) Soft tissues and spinal canal: Prevertebral soft tissues are normal. Disc levels: Cervical intervertebral disc space heights appear preserved. Upper chest: Limited visualization of lung apices demonstrates centrilobular emphysematous change. Other: Regional soft tissues appear normal. Calcified atherosclerotic plaque within the bilateral carotid bulbs. Normal noncontrast appearance of the thyroid gland. No bulky cervical lymphadenopathy on this noncontrast examination. IMPRESSION: 1. Similar findings of atrophy and microvascular ischemic disease without superimposed acute intracranial process. 2. No acute fracture or static subluxation of the cervical spine. 3. Old T1 spinous process fracture. Electronically Signed   By: Sandi Mariscal M.D.   On: 09/17/2017 13:25   Ct Head Wo  Contrast  Result Date: 09/03/2017 CLINICAL DATA:  67 year old male with altered mental status. EXAM: CT HEAD WITHOUT CONTRAST TECHNIQUE: Contiguous axial images were obtained from the base of the skull through the vertex without intravenous contrast. COMPARISON:  Head CT dated 06/16/2017 FINDINGS: Brain: There is mild age-related atrophy and chronic microvascular ischemic changes. There is no acute intracranial hemorrhage. No mass effect or midline shift noted. No extra-axial fluid collection. Vascular: No hyperdense vessel or unexpected calcification. Skull: Normal. Negative for fracture or focal lesion. Sinuses/Orbits: No acute finding. Other: None IMPRESSION: 1. No acute intracranial pathology. 2. Age-related atrophy and chronic microvascular ischemic changes. Electronically Signed   By: Anner Crete M.D.   On: 09/03/2017 21:50   Ct Cervical Spine Wo Contrast  Result Date: 09/17/2017 CLINICAL DATA:  Patient fell last evening, now with lethargy. History of stroke. EXAM: CT HEAD WITHOUT CONTRAST CT CERVICAL SPINE WITHOUT CONTRAST TECHNIQUE: Multidetector CT imaging of the head and cervical spine was performed following the standard protocol without intravenous contrast. Multiplanar CT image reconstructions of the cervical spine were also generated. COMPARISON:  Head CT - 09/03/2017; head and C-spine CT-06/16/2017 FINDINGS: CT HEAD FINDINGS Brain: Moderately advanced atrophy with sulcal prominence and centralized volume loss with commensurate ex vacuo dilatation the ventricular system. Periventricular hypodensities compatible microvascular ischemic disease. Given background parenchymal abnormalities, there is no CT evidence superimposed acute large territory infarct. No intraparenchymal or extra-axial mass or hemorrhage. Unchanged size and configuration of the ventricles and the basilar cisterns. No midline shift. Vascular: Intracranial atherosclerosis. Skull: No displaced calvarial fracture.  Sinuses/Orbits: Limited visualization of the paranasal sinuses and mastoid air cells is normal. No air-fluid levels. A small amount of debris is seen within the bilateral external auditory canals. Post left-sided cataract surgery. Other: Regional soft tissues appear normal. CT CERVICAL SPINE FINDINGS Alignment: C1 to the superior endplate of T2 is imaged. Mild scoliotic curvature of the cervicothoracic spine, potentially positional. Otherwise, normal alignment of the cervical spine. No anterolisthesis or retrolisthesis. Bilateral facets are normally aligned. Skull base and vertebrae: The dens is normally positioned between the lateral masses of C1. Mild degenerative change of the atlantodental articulation. Normal atlantoaxial articulations. Cervical vertebral body heights are preserved. Old fracture involving the T1 spinous process (sagittal image 18, series 10) Soft tissues and spinal canal: Prevertebral soft tissues are normal. Disc levels: Cervical intervertebral disc space heights appear preserved. Upper chest: Limited visualization of lung apices demonstrates centrilobular emphysematous change. Other: Regional soft tissues appear normal. Calcified atherosclerotic plaque within the bilateral carotid bulbs. Normal noncontrast appearance of the thyroid gland. No bulky cervical lymphadenopathy on this noncontrast examination. IMPRESSION: 1. Similar findings of atrophy and microvascular ischemic disease without superimposed acute intracranial process. 2. No acute fracture or static subluxation of the cervical spine. 3. Old T1 spinous process fracture. Electronically Signed   By: Sandi Mariscal M.D.   On: 09/17/2017 13:25   Dg Chest Portable 1 View  Result Date: 09/17/2017 CLINICAL DATA:  Hypoxemia; Per ED notes, last night around 9-10p pt fell and denies LOC; Hx CVA with R sided deficits; pt lethargic and weak; Hx COPD, sarcoidosis, DM, and HTN. Smoker EXAM: PORTABLE CHEST 1 VIEW COMPARISON:  09/03/2017 FINDINGS:  Shallow lung inflation. Heart size is accentuated by portable AP technique. There is stable minimal atelectasis or scarring at the RIGHT lung base. No pulmonary edema. Degenerative changes are seen in thoracic spine. No acute fracture. IMPRESSION: RIGHT LOWER lobe atelectasis. Electronically Signed   By: Nolon Nations M.D.   On: 09/17/2017 12:53    Subjective: No complaints. Got up with PT and "did good." Needed help cleaning up. Has chronic right hand tremor which is currently improved.  Discharge Exam: Vitals:   09/20/17 0827 09/20/17 0948  BP:  (!) 101/47  Pulse: (!) 55 66  Resp: 18 18  Temp:  (!) 97.4 F (36.3 C)  SpO2: 90% 91%   General: Pt is alert, awake, not in acute distress Cardiovascular: Borderline bradycardia, S1/S2 +, no rubs, no gallops Respiratory: CTA bilaterally, no wheezing, no rhonchi Abdominal: Soft, NT, ND, bowel sounds + Extremities: 1+ LE edema, no cyanosis Neuro: Rest tremor of right arm distal groups, can decrease with intention. No focal deficits.  Labs: BNP (last 3 results) Recent Labs  04/06/17 1109 09/03/17 1901 09/17/17 1201  BNP 55.2 36.1 73.2   Basic Metabolic Panel: Recent Labs  Lab 09/17/17 1201 09/18/17 0413  NA 143 141  K 3.9 3.9  CL 105 108  CO2 28 20*  GLUCOSE 82 54*  BUN 25* 14  CREATININE 1.76* 0.85  CALCIUM 8.9 7.6*  MG 2.0  --    Liver Function Tests: Recent Labs  Lab 09/18/17 0413  AST 14*  ALT 9  ALKPHOS 30*  BILITOT 0.5  PROT 3.4*  ALBUMIN 1.7*   No results for input(s): LIPASE, AMYLASE in the last 168 hours. No results for input(s): AMMONIA in the last 168 hours. CBC: Recent Labs  Lab 09/17/17 1201 09/18/17 0413 09/19/17 0733  WBC 8.7 5.0 9.1  NEUTROABS 5.3  --   --   HGB 13.0 8.7* 11.8*  HCT 42.5 29.2* 38.7*  MCV 88.0 90.7 89.4  PLT 221 144* 159   Cardiac Enzymes: Recent Labs  Lab 09/17/17 1201 09/18/17 0413  CKTOTAL 550* 278  TROPONINI <0.03  --    BNP: Invalid input(s):  POCBNP CBG: Recent Labs  Lab 09/17/17 1151 09/17/17 1822 09/17/17 2207 09/18/17 0953  GLUCAP 228* 66* 95 132*   D-Dimer No results for input(s): DDIMER in the last 72 hours. Hgb A1c No results for input(s): HGBA1C in the last 72 hours. Lipid Profile No results for input(s): CHOL, HDL, LDLCALC, TRIG, CHOLHDL, LDLDIRECT in the last 72 hours. Thyroid function studies Recent Labs    09/17/17 1934  TSH 0.756   Anemia work up No results for input(s): VITAMINB12, FOLATE, FERRITIN, TIBC, IRON, RETICCTPCT in the last 72 hours. Urinalysis    Component Value Date/Time   COLORURINE YELLOW 09/04/2017 1750   APPEARANCEUR CLEAR 09/04/2017 1750   LABSPEC 1.010 09/04/2017 1750   PHURINE 7.0 09/04/2017 1750   GLUCOSEU NEGATIVE 09/04/2017 1750   HGBUR SMALL (A) 09/04/2017 1750   HGBUR negative 12/11/2009 1356   BILIRUBINUR NEGATIVE 09/04/2017 1750   BILIRUBINUR MODERATE 06/05/2015 1407   KETONESUR 5 (A) 09/04/2017 1750   PROTEINUR NEGATIVE 09/04/2017 1750   UROBILINOGEN 2.0 06/05/2015 1407   UROBILINOGEN 1.0 12/30/2014 1830   NITRITE NEGATIVE 09/04/2017 1750   LEUKOCYTESUR NEGATIVE 09/04/2017 1750    Microbiology No results found for this or any previous visit (from the past 240 hour(s)).  Time coordinating discharge: Approximately 40 minutes  Patrecia Pour, MD  [facility] 09/20/2017, 1:46 PM Pager [phone]

## 2017-09-20 NOTE — Clinical Social Work Note (Signed)
Clinical Social Work Assessment  Patient Details  Name: Patrick Holt MRN: [id number] Date of Birth: 12/04/1950  Date of referral:  09/19/17               Reason for consult:  Facility Placement, Discharge Planning                Permission sought to share information with:  Other(Friends) Permission granted to share information::  Yes, Verbal Permission Granted  Name::     Patrick Holt and [place]::     Relationship::  Friend and Aide  Contact Information:  Patrick Holt [phone] and Patrick Holt [phone]  Housing/Transportation Living arrangements for the past 2 months:  Apartment Source of Information:  Patient Patient Interpreter Needed:  None Criminal Activity/Legal Involvement Pertinent to Current Situation/Hospitalization:  No - Comment as needed Significant Relationships:  Friend, Other(Comment)(Patient's aide Patrick Holt) Lives with:  Self Do you feel safe going back to the place where you live?  No(Patient in agreement with ST rehab before returning home) Need for family participation in patient care:  No (Coment)  Care giving concerns: Patient reported that he has an aide 3 days a week: MWF for about 2/3 hours per day. Mr. Dzikowski added that some days she may stay a little longer depending on what she is doing. Patient expressed understanding that ST rehab will benefit him as he is home alone most of the time.  Social Worker assessment / plan:  CSW talked with patient on 8/5 at the bedside regarding discharge disposition and recommendation of ST rehab. Patient was sitting up in bed and was alert, oriented and willing to talk with CSW regarding his discharge plan. Mr. Rushlow reported that he lives at [facility] and has been there for 4 years and has an aide that comes 3 days a week. When asked, patient responded that he has no children or family. The recommendation of ST rehab discussed and patient willing to go. Mr. Mitchelle provided with SNF list and  facility search process explained.   Employment status:  Retired Research officer, political party) PT Recommendations:  [place] / Referral to community resources:  [place] [facility](Patient provided with SNF list for [facility])  Patient/Family's Response to care: Patient expressed no concerns regarding his care during hospitalization.  Patient/Family's Understanding of and Emotional Response to Diagnosis, Current Treatment, and Prognosis: CSW discussed with patient the importance of him getting stronger and his safety and Mr. Donlan expressed understanding and agreement.   Emotional Assessment Appearance:  Appears older than stated age Attitude/Demeanor/Rapport:  Engaged Affect (typically observed):  Appropriate Orientation:  Oriented to Self, Oriented to Place, Oriented to  Time, Oriented to Situation Alcohol / Substance use:  Tobacco Use, Alcohol Use, Illicit Drugs(Patient reported that he smokes, stopped drinking in 2014, and does not use illicit drugs) Psych involvement (Current and /or in the community):  No (Comment)  Discharge Needs  Concerns to be addressed:  Discharge Planning Concerns Readmission within the last 30 days:  Yes Current discharge risk:  None Barriers to Discharge:  [place], Patrick Holt, [place] 09/20/2017, 10:36 AM

## 2017-09-20 NOTE — Progress Notes (Signed)
Physical Therapy Treatment Patient Details Name: Patrick Holt MRN: [id number] DOB: [birth year] Today's Date: 09/20/2017    History of Present Illness  67 y.o. male with a known history of COPD, HTN, HLD, CAD,  Atrial fib, bipolar disorder, seizure disorder, dermatomyositis/ RA, CVA presents to the emergency department for evaluation of weakness, body aches.     PT Comments    Continuing work on functional mobility and activity tolerance; Pt presented supine in bed with HOB elevated. He initially denied PT services, but then agreed, stating that he needed to have a BM. Proceeded to ambulate patient from bed to 3-in-1 bedside commode placed nearby. Pt exhibited much more tolerance with activity compared to prior PT session. Was able to mobilize in bed faster and with less assistance, still needing VC for efficiency. Sit to stand also improved with pt stating that he felt "stronger" today. Pt had BM and able to tolerate longer periods of standing with UE supported on walker for hygienic cleaning and dressing change. Although pt has improved, he still needs mod A for all mobility and ADLs. Recommend to continue plan of care and recommendations below.     Follow Up Recommendations  SNF;Supervision/Assistance - 24 hour;Supervision for mobility/OOB     Equipment Recommendations  Rolling walker with 5" wheels;3in1 (PT)    Recommendations for Other Services OT consult     Precautions / Restrictions Precautions Precautions: Fall Precaution Comments: mulitple falls in past year; most recent in the past week in which pt spent 12 hours on the floor Restrictions Weight Bearing Restrictions: No    Mobility  Bed Mobility Overal bed mobility: Needs Assistance Bed Mobility: Supine to Sit;Rolling Rolling: Mod assist   Supine to sit: Mod assist     General bed mobility comments: assist to raise trunk, advance BLEs, and to scoot hips towards EOB. Bed mobility is generally slow and inefficient.  Pt needing VCs for hand placement and sequencing. Pt had difficulty reciprocally scooting to edge of bed. Required manual assistance via bed pad to scoot out.  Transfers Overall transfer level: Needs assistance Equipment used: Rolling walker (2 wheeled) Transfers: Sit to/from Stand Sit to Stand: Mod assist         General transfer comment: mod assist to power up to stand; extensive WB through UE during sit to/from stand; descent during stand>sit very slow and controlled  Ambulation/Gait Ambulation/Gait assistance: Min assist Gait Distance (Feet): 10 Feet(ambulated within room to/from bedside commode) Assistive device: Rolling walker (2 wheeled) Gait Pattern/deviations: Step-to pattern;Wide base of support;Decreased step length - right;Decreased step length - left;Decreased stride length Gait velocity: decr   General Gait Details: Slow; kyphotic posture; forward head   Stairs             Wheelchair Mobility    Modified Rankin (Stroke Patients Only)       Balance Overall balance assessment: Needs assistance;History of Falls Sitting-balance support: Feet supported Sitting balance-Leahy Scale: Fair Sitting balance - Comments: slight lateral lean with dynamic activity, pt able to correct himself with verbal cues   Standing balance support: Bilateral upper extremity supported Standing balance-Leahy Scale: Poor Standing balance comment: relies on BUE support                            Cognition Arousal/Alertness: Awake/alert Behavior During Therapy: WFL for tasks assessed/performed Overall Cognitive Status: Within Functional Limits for tasks assessed  General Comments: pt reports STM deficits at baseline      Exercises      General Comments General comments (skin integrity, edema, etc.): abrasions over forehead, nose, and knee; LE edematous and dry/scaly.  Noted areas of decr skin integrity bilateral  buttocks near anal cleft while assisting with pericare; applied dressings and reported to RN"      Pertinent Vitals/Pain Pain Assessment: Faces Pain Score: 3  Faces Pain Scale: Hurts a little bit Pain Intervention(s): Monitored during session;Repositioned    Home Living                      Prior Function            PT Goals (current goals can now be found in the care plan section) Acute Rehab PT Goals Patient Stated Goal: feel better PT Goal Formulation: With patient Time For Goal Achievement: 10/02/17 Potential to Achieve Goals: Good Progress towards PT goals: Progressing toward goals    Frequency    Min 2X/week      PT Plan Current plan remains appropriate    Co-evaluation              AM-PAC PT "6 Clicks" Daily Activity  Outcome Measure  Difficulty turning over in bed (including adjusting bedclothes, sheets and blankets)?: A Lot Difficulty moving from lying on back to sitting on the side of the bed? : A Lot Difficulty sitting down on and standing up from a chair with arms (e.g., wheelchair, bedside commode, etc,.)?: A Lot Help needed moving to and from a bed to chair (including a wheelchair)?: A Lot Help needed walking in hospital room?: A Lot Help needed climbing 3-5 steps with a railing? : Total 6 Click Score: 11    End of Session Equipment Utilized During Treatment: Gait belt Activity Tolerance: Patient limited by fatigue;Patient limited by pain;Treatment limited secondary to medical complications (Comment) Patient left: in chair;with call bell/phone within reach;with chair alarm set Nurse Communication: Mobility status;Precautions;Need for lift equipment PT Visit Diagnosis: Unsteadiness on feet (R26.81);Repeated falls (R29.6);Other abnormalities of gait and mobility (R26.89);History of falling (Z91.81);Muscle weakness (generalized) (M62.81);Difficulty in walking, not elsewhere classified (R26.2);Other symptoms and signs involving the nervous  system (R29.898);Pain     Time: 1046-1130(minus approx 5 mins on commode) PT Time Calculation (min) (ACUTE ONLY): 44 min  Charges:  $Gait Training: 8-22 mins $Therapeutic Activity: 8-22 mins                    Patrick Holt, SPT Office # [phone]    [name] 09/20/2017, 2:07 PM

## 2017-09-21 ENCOUNTER — Encounter: Payer: Self-pay | Admitting: Internal Medicine

## 2017-10-11 ENCOUNTER — Emergency Department (HOSPITAL_COMMUNITY): Payer: Medicare Other

## 2017-10-11 ENCOUNTER — Encounter (HOSPITAL_COMMUNITY): Payer: Self-pay

## 2017-10-11 ENCOUNTER — Inpatient Hospital Stay (HOSPITAL_COMMUNITY)
Admission: EM | Admit: 2017-10-11 | Discharge: 2017-10-18 | DRG: 175 | Disposition: A | Discharge status: Skilled Nursing Facility | Payer: Medicare Other | Attending: Internal Medicine | Admitting: Internal Medicine

## 2017-10-11 ENCOUNTER — Emergency Department (HOSPITAL_COMMUNITY)
Admit: 2017-10-11 | Discharge: 2017-10-11 | Disposition: A | Discharge status: Home / Self Care | Payer: Medicare Other | Attending: Emergency Medicine | Admitting: Emergency Medicine

## 2017-10-11 ENCOUNTER — Other Ambulatory Visit: Payer: Self-pay

## 2017-10-11 DIAGNOSIS — J440 Chronic obstructive pulmonary disease with acute lower respiratory infection: Secondary | ICD-10-CM | POA: Diagnosis not present

## 2017-10-11 DIAGNOSIS — E1142 Type 2 diabetes mellitus with diabetic polyneuropathy: Secondary | ICD-10-CM | POA: Diagnosis present

## 2017-10-11 DIAGNOSIS — Z6837 Body mass index (BMI) 37.0-37.9, adult: Secondary | ICD-10-CM

## 2017-10-11 DIAGNOSIS — K210 Gastro-esophageal reflux disease with esophagitis: Secondary | ICD-10-CM | POA: Diagnosis not present

## 2017-10-11 DIAGNOSIS — I2602 Saddle embolus of pulmonary artery with acute cor pulmonale: Principal | ICD-10-CM | POA: Diagnosis present

## 2017-10-11 DIAGNOSIS — K219 Gastro-esophageal reflux disease without esophagitis: Secondary | ICD-10-CM | POA: Diagnosis present

## 2017-10-11 DIAGNOSIS — E1122 Type 2 diabetes mellitus with diabetic chronic kidney disease: Secondary | ICD-10-CM | POA: Diagnosis present

## 2017-10-11 DIAGNOSIS — R32 Unspecified urinary incontinence: Secondary | ICD-10-CM | POA: Diagnosis present

## 2017-10-11 DIAGNOSIS — I7300 Raynaud's syndrome without gangrene: Secondary | ICD-10-CM | POA: Diagnosis present

## 2017-10-11 DIAGNOSIS — R402250 Coma scale, best verbal response, oriented, unspecified time: Secondary | ICD-10-CM | POA: Diagnosis not present

## 2017-10-11 DIAGNOSIS — R601 Generalized edema: Secondary | ICD-10-CM | POA: Diagnosis present

## 2017-10-11 DIAGNOSIS — R402360 Coma scale, best motor response, obeys commands, unspecified time: Secondary | ICD-10-CM | POA: Diagnosis not present

## 2017-10-11 DIAGNOSIS — I2510 Atherosclerotic heart disease of native coronary artery without angina pectoris: Secondary | ICD-10-CM | POA: Diagnosis present

## 2017-10-11 DIAGNOSIS — I130 Hypertensive heart and chronic kidney disease with heart failure and stage 1 through stage 4 chronic kidney disease, or unspecified chronic kidney disease: Secondary | ICD-10-CM | POA: Diagnosis present

## 2017-10-11 DIAGNOSIS — F1121 Opioid dependence, in remission: Secondary | ICD-10-CM | POA: Diagnosis present

## 2017-10-11 DIAGNOSIS — R059 Cough, unspecified: Secondary | ICD-10-CM

## 2017-10-11 DIAGNOSIS — F419 Anxiety disorder, unspecified: Secondary | ICD-10-CM | POA: Diagnosis present

## 2017-10-11 DIAGNOSIS — E669 Obesity, unspecified: Secondary | ICD-10-CM | POA: Diagnosis present

## 2017-10-11 DIAGNOSIS — I2699 Other pulmonary embolism without acute cor pulmonale: Secondary | ICD-10-CM

## 2017-10-11 DIAGNOSIS — R5383 Other fatigue: Secondary | ICD-10-CM

## 2017-10-11 DIAGNOSIS — M797 Fibromyalgia: Secondary | ICD-10-CM | POA: Diagnosis present

## 2017-10-11 DIAGNOSIS — Z882 Allergy status to sulfonamides status: Secondary | ICD-10-CM

## 2017-10-11 DIAGNOSIS — I361 Nonrheumatic tricuspid (valve) insufficiency: Secondary | ICD-10-CM | POA: Diagnosis not present

## 2017-10-11 DIAGNOSIS — R609 Edema, unspecified: Secondary | ICD-10-CM

## 2017-10-11 DIAGNOSIS — I959 Hypotension, unspecified: Secondary | ICD-10-CM | POA: Diagnosis present

## 2017-10-11 DIAGNOSIS — R05 Cough: Secondary | ICD-10-CM

## 2017-10-11 DIAGNOSIS — E860 Dehydration: Secondary | ICD-10-CM | POA: Diagnosis present

## 2017-10-11 DIAGNOSIS — Z881 Allergy status to other antibiotic agents status: Secondary | ICD-10-CM

## 2017-10-11 DIAGNOSIS — Y95 Nosocomial condition: Secondary | ICD-10-CM | POA: Diagnosis not present

## 2017-10-11 DIAGNOSIS — J13 Pneumonia due to Streptococcus pneumoniae: Secondary | ICD-10-CM | POA: Diagnosis not present

## 2017-10-11 DIAGNOSIS — F429 Obsessive-compulsive disorder, unspecified: Secondary | ICD-10-CM | POA: Diagnosis present

## 2017-10-11 DIAGNOSIS — N179 Acute kidney failure, unspecified: Secondary | ICD-10-CM | POA: Diagnosis present

## 2017-10-11 DIAGNOSIS — N189 Chronic kidney disease, unspecified: Secondary | ICD-10-CM | POA: Diagnosis present

## 2017-10-11 DIAGNOSIS — E870 Hyperosmolality and hypernatremia: Secondary | ICD-10-CM | POA: Diagnosis present

## 2017-10-11 DIAGNOSIS — Z8679 Personal history of other diseases of the circulatory system: Secondary | ICD-10-CM

## 2017-10-11 DIAGNOSIS — D631 Anemia in chronic kidney disease: Secondary | ICD-10-CM | POA: Diagnosis present

## 2017-10-11 DIAGNOSIS — I480 Paroxysmal atrial fibrillation: Secondary | ICD-10-CM | POA: Diagnosis present

## 2017-10-11 DIAGNOSIS — R0602 Shortness of breath: Secondary | ICD-10-CM | POA: Diagnosis present

## 2017-10-11 DIAGNOSIS — Z7951 Long term (current) use of inhaled steroids: Secondary | ICD-10-CM

## 2017-10-11 DIAGNOSIS — G40909 Epilepsy, unspecified, not intractable, without status epilepticus: Secondary | ICD-10-CM | POA: Diagnosis present

## 2017-10-11 DIAGNOSIS — I10 Essential (primary) hypertension: Secondary | ICD-10-CM | POA: Diagnosis present

## 2017-10-11 DIAGNOSIS — I5082 Biventricular heart failure: Secondary | ICD-10-CM | POA: Diagnosis present

## 2017-10-11 DIAGNOSIS — F449 Dissociative and conversion disorder, unspecified: Secondary | ICD-10-CM | POA: Diagnosis present

## 2017-10-11 DIAGNOSIS — E785 Hyperlipidemia, unspecified: Secondary | ICD-10-CM | POA: Diagnosis present

## 2017-10-11 DIAGNOSIS — J189 Pneumonia, unspecified organism: Secondary | ICD-10-CM | POA: Diagnosis not present

## 2017-10-11 DIAGNOSIS — M7989 Other specified soft tissue disorders: Secondary | ICD-10-CM | POA: Diagnosis not present

## 2017-10-11 DIAGNOSIS — R402140 Coma scale, eyes open, spontaneous, unspecified time: Secondary | ICD-10-CM | POA: Diagnosis not present

## 2017-10-11 DIAGNOSIS — E8809 Other disorders of plasma-protein metabolism, not elsewhere classified: Secondary | ICD-10-CM | POA: Diagnosis present

## 2017-10-11 DIAGNOSIS — Z8669 Personal history of other diseases of the nervous system and sense organs: Secondary | ICD-10-CM

## 2017-10-11 DIAGNOSIS — F17210 Nicotine dependence, cigarettes, uncomplicated: Secondary | ICD-10-CM | POA: Diagnosis present

## 2017-10-11 DIAGNOSIS — I252 Old myocardial infarction: Secondary | ICD-10-CM

## 2017-10-11 DIAGNOSIS — J449 Chronic obstructive pulmonary disease, unspecified: Secondary | ICD-10-CM | POA: Diagnosis present

## 2017-10-11 DIAGNOSIS — F329 Major depressive disorder, single episode, unspecified: Secondary | ICD-10-CM | POA: Diagnosis present

## 2017-10-11 DIAGNOSIS — M069 Rheumatoid arthritis, unspecified: Secondary | ICD-10-CM | POA: Diagnosis present

## 2017-10-11 DIAGNOSIS — I872 Venous insufficiency (chronic) (peripheral): Secondary | ICD-10-CM | POA: Diagnosis present

## 2017-10-11 DIAGNOSIS — Z887 Allergy status to serum and vaccine status: Secondary | ICD-10-CM

## 2017-10-11 DIAGNOSIS — Z888 Allergy status to other drugs, medicaments and biological substances status: Secondary | ICD-10-CM

## 2017-10-11 DIAGNOSIS — K224 Dyskinesia of esophagus: Secondary | ICD-10-CM | POA: Diagnosis present

## 2017-10-11 DIAGNOSIS — Z87820 Personal history of traumatic brain injury: Secondary | ICD-10-CM

## 2017-10-11 DIAGNOSIS — J9601 Acute respiratory failure with hypoxia: Secondary | ICD-10-CM | POA: Diagnosis not present

## 2017-10-11 DIAGNOSIS — Z720 Tobacco use: Secondary | ICD-10-CM | POA: Diagnosis present

## 2017-10-11 DIAGNOSIS — Z7982 Long term (current) use of aspirin: Secondary | ICD-10-CM

## 2017-10-11 DIAGNOSIS — Z885 Allergy status to narcotic agent status: Secondary | ICD-10-CM

## 2017-10-11 DIAGNOSIS — Z86010 Personal history of colonic polyps: Secondary | ICD-10-CM

## 2017-10-11 DIAGNOSIS — Z79899 Other long term (current) drug therapy: Secondary | ICD-10-CM

## 2017-10-11 DIAGNOSIS — D869 Sarcoidosis, unspecified: Secondary | ICD-10-CM | POA: Diagnosis present

## 2017-10-11 DIAGNOSIS — I5030 Unspecified diastolic (congestive) heart failure: Secondary | ICD-10-CM | POA: Diagnosis present

## 2017-10-11 DIAGNOSIS — Z880 Allergy status to penicillin: Secondary | ICD-10-CM

## 2017-10-11 DIAGNOSIS — Z86718 Personal history of other venous thrombosis and embolism: Secondary | ICD-10-CM

## 2017-10-11 LAB — COMPREHENSIVE METABOLIC PANEL
ALT: 11 U/L (ref 0–44)
ANION GAP: 8 (ref 5–15)
AST: 11 U/L — AB (ref 15–41)
Albumin: 2.8 g/dL — ABNORMAL LOW (ref 3.5–5.0)
Alkaline Phosphatase: 65 U/L (ref 38–126)
BUN: 14 mg/dL (ref 8–23)
CHLORIDE: 109 mmol/L (ref 98–111)
CO2: 29 mmol/L (ref 22–32)
Calcium: 9 mg/dL (ref 8.9–10.3)
Creatinine, Ser: 1.3 mg/dL — ABNORMAL HIGH (ref 0.61–1.24)
GFR calc Af Amer: >60 mL/min (ref >60)
GFR, EST NON AFRICAN AMERICAN: 55 mL/min — AB (ref >60)
Glucose, Bld: 101 mg/dL — ABNORMAL HIGH (ref 70–99)
POTASSIUM: 3.9 mmol/L (ref 3.5–5.1)
Sodium: 146 mmol/L — ABNORMAL HIGH (ref 135–145)
Total Bilirubin: 1.1 mg/dL (ref 0.3–1.2)
Total Protein: 5.8 g/dL — ABNORMAL LOW (ref 6.5–8.1)

## 2017-10-11 LAB — CBC WITH DIFFERENTIAL/PLATELET
BASOS ABS: 0 10*3/uL (ref 0.0–0.1)
Basophils Relative: 0 %
EOS PCT: 1 %
Eosinophils Absolute: 0.1 10*3/uL (ref 0.0–0.7)
HCT: 41.5 % (ref 39.0–52.0)
Hemoglobin: 12.6 g/dL — ABNORMAL LOW (ref 13.0–17.0)
LYMPHS ABS: 2 10*3/uL (ref 0.7–4.0)
Lymphocytes Relative: 30 %
MCH: 28.2 pg (ref 26.0–34.0)
MCHC: 30.4 g/dL (ref 30.0–36.0)
MCV: 92.8 fL (ref 78.0–100.0)
MONO ABS: 0.7 10*3/uL (ref 0.1–1.0)
Monocytes Relative: 10 %
Neutro Abs: 3.9 10*3/uL (ref 1.7–7.7)
Neutrophils Relative %: 59 %
PLATELETS: 200 10*3/uL (ref 150–400)
RBC: 4.47 MIL/uL (ref 4.22–5.81)
RDW: 21.5 % — AB (ref 11.5–15.5)
WBC: 6.7 10*3/uL (ref 4.0–10.5)

## 2017-10-11 LAB — I-STAT TROPONIN, ED
TROPONIN I, POC: 0 ng/mL (ref 0.00–0.08)
Troponin i, poc: 0.02 ng/mL (ref 0.00–0.08)

## 2017-10-11 LAB — MAGNESIUM: MAGNESIUM: 2 mg/dL (ref 1.7–2.4)

## 2017-10-11 LAB — URINALYSIS, ROUTINE W REFLEX MICROSCOPIC
Bilirubin Urine: NEGATIVE
Glucose, UA: NEGATIVE mg/dL
Hgb urine dipstick: NEGATIVE
KETONES UR: NEGATIVE mg/dL
LEUKOCYTES UA: NEGATIVE
NITRITE: NEGATIVE
PH: 5 (ref 5.0–8.0)
Protein, ur: NEGATIVE mg/dL
Specific Gravity, Urine: 1.038 — ABNORMAL HIGH (ref 1.005–1.030)

## 2017-10-11 LAB — I-STAT CG4 LACTIC ACID, ED
LACTIC ACID, VENOUS: 1.13 mmol/L (ref 0.5–1.9)
LACTIC ACID, VENOUS: 0.8 mmol/L (ref 0.5–1.9)

## 2017-10-11 LAB — BRAIN NATRIURETIC PEPTIDE: B Natriuretic Peptide: 20.8 pg/mL (ref 0.0–100.0)

## 2017-10-11 MED ORDER — IOPAMIDOL (ISOVUE-370) INJECTION 76%
INTRAVENOUS | Status: AC
  Filled 2017-10-11: qty 100

## 2017-10-11 MED ORDER — IOPAMIDOL (ISOVUE-370) INJECTION 76%
100.0000 mL | Freq: Once | INTRAVENOUS | Status: AC | PRN
  Administered 2017-10-11: 100 mL via INTRAVENOUS

## 2017-10-11 MED ORDER — HEPARIN BOLUS VIA INFUSION
4000.0000 [IU] | Freq: Once | INTRAVENOUS | Status: AC
  Administered 2017-10-11: 4000 [IU] via INTRAVENOUS
  Filled 2017-10-11: qty 4000

## 2017-10-11 MED ORDER — HEPARIN (PORCINE) IN NACL 100-0.45 UNIT/ML-% IJ SOLN
1400.0000 [IU]/h | INTRAMUSCULAR | Status: AC
  Administered 2017-10-11: 1300 [IU]/h via INTRAVENOUS
  Administered 2017-10-12 – 2017-10-14 (×3): 1350 [IU]/h via INTRAVENOUS
  Filled 2017-10-11 (×4): qty 250

## 2017-10-11 MED ORDER — SODIUM CHLORIDE 0.9 % IV BOLUS: 250.0000 mL | Freq: Once | INTRAVENOUS | Status: DC

## 2017-10-11 MED ORDER — NICOTINE 21 MG/24HR TD PT24
21.0000 mg | MEDICATED_PATCH | Freq: Every day | TRANSDERMAL | Status: DC
  Administered 2017-10-12 – 2017-10-17 (×5): 21 mg via TRANSDERMAL
  Filled 2017-10-11 (×7): qty 1

## 2017-10-11 NOTE — ED Triage Notes (Signed)
Pt BIB [facility] d/t [facility] called for Pt having BLE weakness & edema. Upon EMS arrival Pt was HyPOtensive. Int'l 80 Pal. Pt given 400cc NS Bolus.   Hx. CHF

## 2017-10-11 NOTE — ED Provider Notes (Addendum)
[facility] EMERGENCY DEPARTMENT Provider Note   CSN: [id number] Arrival date & time: 10/11/17  1325     History   Chief Complaint Chief Complaint  Patient presents with  . Hypotension    HPI Patrick Holt is a 67 y.o. male.  The history is provided by the patient and medical records. No language interpreter was used.  Shortness of Breath  This is a recurrent problem. The average episode lasts 3 days. The problem occurs continuously.The current episode started more than 2 days ago. The problem has been gradually worsening. Associated symptoms include cough, leg pain and leg swelling. Pertinent negatives include no fever, no headaches, no neck pain, no sputum production, no hemoptysis, no wheezing, no chest pain, no vomiting, no abdominal pain and no rash. He has tried nothing for the symptoms. The treatment provided no relief. He has had prior hospitalizations. Associated medical issues include asthma, COPD, CAD, heart failure and DVT.    Past Medical History:  Diagnosis Date  . Anemia   . Anxiety   . Asthma   . Atrial fibrillation ([place]) 01/2012   with RVR  . Bipolar 1 disorder ([place])   . Cancer ([place])   . Collagen vascular disease ([place])   . Conversion disorder 06/2010  . COPD (chronic obstructive pulmonary disease) ([place])   . Dermatomyositis ([place])   . Dermatomyositis ([place])   . Diverticulitis   . Diverticulosis of colon   . Dysrhythmia    "irregular" (11/15/2012)  . Esophageal dysmotility   . Esophageal stricture   . Fibromyalgia   . Gastritis   . GERD (gastroesophageal reflux disease)   . Hand fracture, right    sept 13, 2017, still wearing splint as og 01-10-2017  . Headache(784.0)    "severe; get them often" (11/15/2012)  . Hiatal hernia   . History of narcotic addiction ([place])   . Hx of adenomatous colonic polyps   . Hyperlipidemia   . Hypertension   . Internal hemorrhoids   . Ischemic heart disease   . Major depression    with acute  psychotic break in 06/2010  . Myocardial infarct ([place])    mulitple (1999, 2000, 2004)  . Narcotic dependence ([place])   . Nephrolithiasis   . Obesity   . OCD (obsessive compulsive disorder)   . Otosclerosis   . Paroxysmal A-fib ([place])   . Peripheral neuropathy   . Raynaud's disease   . Renal insufficiency   . Rheumatoid arthritis(714.0)   . Sarcoidosis   . Seizures ([place])   . Subarachnoid hemorrhage ([place]) 01/2012   with subdural  hematoma.   . Type II diabetes mellitus ([place])   . Urge incontinence   . Vertigo     Patient Active Problem List   Diagnosis Date Noted  . ARF (acute renal failure) ([place]) 09/17/2017  . Unstable angina ([place]) 06/16/2017  . L1 and L2 vertebral fracture ([place]) 04/06/2017  . Multiple rib fractures 04/06/2017  . Evaluation by psychiatric service required 03/07/2017  . Acute metabolic encephalopathy 41/66/0630  . Hypoglycemia 03/04/2017  . Acute respiratory failure with hypoxia ([place]) 12-30-202019  . Acute encephalopathy 12-30-202019  . Rhabdomyolysis 02/05/2017  . Acute respiratory failure with hypoxia and hypercapnia ([place]) 02/05/2017  . COPD with acute exacerbation ([place]) 02/05/2017  . Bipolar 1 disorder ([place]) 02/05/2017  . Anemia 02/05/2017  . Hypertension 02/05/2017  . Hand fracture, right 02/05/2017  . Narcotic dependence ([place]) 02/05/2017  . Pre-diabetes 02/05/2017  . PAF (paroxysmal atrial fibrillation) ([place]) 02/05/2017  .  Frequent falls 02/05/2017  . History of dermatomyositis 02/05/2017  . Seizure disorder ([place]) 02/05/2017  . Bilateral lower extremity edema 02/05/2017  . Pulmonary artery hypertension ([place]) 01/15/2017  . Altered mental status 01/13/2017  . Low oxygen saturation   . Pain in right wrist 02/27/2016  . Altered mental state 11/14/2015  . Severe dehydration 11/14/2015  . Hypernatremia 11/14/2015  . Difficulty in walking, not elsewhere classified   . Right wrist fracture   . Closed displaced fracture of ulnar styloid with routine healing     . Scapholunate ligament injury with no instability   . Falls frequently   . Hypoxia 11/02/2015  . Anxiety 06/06/2015  . Impaired mobility 04/28/2015  . Lumbar spondylosis with myelopathy 03/07/2015  . Nocturia 12/03/2014  . Chronic venous insufficiency 09/23/2014  . Poor social situation 07/30/2014  . Schizophrenia, acute ([place])   . Schizoaffective disorder, bipolar type ([place])   . Decreased visual acuity 06/27/2014  . COPD exacerbation ([place])   . Right sided weakness 01/04/2014  . Essential hypertension, benign   . Atrial fibrillation, unspecified   . Radiculopathy of cervical region   . Polyuria 12/07/2013  . Erectile dysfunction 12/07/2013  . Diverticulitis 08/01/2013  . Acute diverticulitis 08/01/2013  . Hematochezia 06/04/2013  . Dehydration 11/09/2012  . Hypokalemia 10/03/2012  . Benign neoplasm of colon 09/27/2012  . Stricture and stenosis of esophagus 09/27/2012  . Diverticulitis of colon without hemorrhage 07/26/2012  . Polypharmacy 03/26/2012  . Seizures ([place]) 02/10/2012  . Atrial fibrillation ([place]) 02/05/2012  . Coronary atherosclerosis of native coronary artery 02/05/2012  . Radicular low back pain 03/09/2011  . History of seizure disorder 01/29/2011  . Irritable bowel syndrome (IBS) 06/24/2010  . Urge incontinence 06/19/2010  . Tobacco abuse 11/23/2008  . DEPRESSION, MAJOR 05/31/2008  . ISCHEMIC HEART DISEASE 05/31/2008  . RAYNAUD'S DISEASE 05/31/2008  . HIATAL HERNIA 05/31/2008  . Rheumatoid arthritis ([place]) 05/31/2008  . FIBROMYALGIA 05/31/2008  . Hyperlipidemia 01/26/2008  . Anxiety state 01/26/2008  . HEMORRHOIDS, INTERNAL 01/26/2008  . COPD (chronic obstructive pulmonary disease) ([place]) 01/26/2008  . ESOPHAGEAL MOTILITY DISORDER 01/26/2008  . GERD 01/26/2008  . Dermatomyositis ([place]) 01/26/2008    Past Surgical History:  Procedure Laterality Date  . BACK SURGERY    . CARDIAC CATHETERIZATION    . CARPAL TUNNEL RELEASE Bilateral   . CATARACT  EXTRACTION W/ INTRAOCULAR LENS IMPLANT Left   . COLONOSCOPY N/A 09/27/2012   Procedure: COLONOSCOPY;  Surgeon: Lafayette Dragon, MD;  Location: [facility] ENDOSCOPY;  Service: Endoscopy;  Laterality: N/A;  . ESOPHAGOGASTRODUODENOSCOPY N/A 09/27/2012   Procedure: ESOPHAGOGASTRODUODENOSCOPY (EGD);  Surgeon: Lafayette Dragon, MD;  Location: [facility];  Service: Endoscopy;  Laterality: N/A;  . KNEE ARTHROSCOPY W/ MENISCAL REPAIR Left 2009  . LUMBAR DISC SURGERY    . LYMPH NODE DISSECTION Right 1970's   "neck; dr thought I had Hodgkins; turned out to be sarcoidosis" (11/15/2012)  . squamous papilloma   2010   removed by Dr. Constance Holster ENT, noted on tongue  . TONSILLECTOMY          Home Medications    Prior to Admission medications   Medication Sig Start Date End Date Taking? Authorizing Provider  ALPRAZolam Duanne Moron) 0.5 MG tablet Take 1 tablet (0.5 mg total) by mouth 3 (three) times daily. 09/20/17   Patrecia Pour, MD  amiodarone (PACERONE) 200 MG tablet Take 1 tablet (200 mg total) by mouth daily. Patient taking differently: Take 100 mg by mouth daily.  09/07/17  Oretha Milch D, MD  aspirin EC 81 MG tablet Take 1 tablet (81 mg total) by mouth daily. 09/07/17   Desiree Hane, MD  atorvastatin (LIPITOR) 40 MG tablet Take 1 tablet (40 mg total) by mouth daily. 09/07/17   Desiree Hane, MD  budesonide-formoterol (SYMBICORT) 160-4.5 MCG/ACT inhaler Inhale 2 puffs into the lungs 2 (two) times daily. 09/07/17   Oretha Milch D, MD  divalproex (DEPAKOTE) 250 MG DR tablet Take 1 tablet (250 mg total) by mouth 3 (three) times daily. 09/07/17   Oretha Milch D, MD  ergocalciferol (VITAMIN D2) 50000 units capsule Take 1 capsule (50,000 Units total) by mouth once a week. 09/07/17   Desiree Hane, MD  esomeprazole (NEXIUM) 40 MG capsule Take 1 capsule (40 mg total) by mouth daily. 09/07/17   Oretha Milch D, MD  ipratropium-albuterol (DUONEB) 0.5-2.5 (3) MG/3ML SOLN Inhale 3 mLs into the lungs every 6 (six) hours as  needed. 09/07/17   Desiree Hane, MD  levETIRAcetam (KEPPRA) 500 MG tablet Take 1 tablet (500 mg total) by mouth 2 (two) times daily. 09/07/17   Desiree Hane, MD  loperamide (IMODIUM) 2 MG capsule Take 1 capsule (2 mg total) by mouth as needed for diarrhea or loose stools. 09/07/17   Desiree Hane, MD  Multiple Vitamin (MULTIVITAMIN WITH MINERALS) TABS tablet Take 1 tablet by mouth daily. 09/07/17   Desiree Hane, MD  oxybutynin (DITROPAN) 5 MG tablet Take 1 tablet (5 mg total) by mouth 3 (three) times daily. 09/07/17   Oretha Milch D, MD  Potassium Chloride ER 20 MEQ TBCR Take 20 mEq by mouth every other day. 09/20/17   Patrecia Pour, MD  predniSONE (DELTASONE) 10 MG tablet Take 1 tablet (10 mg total) by mouth daily with breakfast. 09/20/17   Patrecia Pour, MD  risperiDONE (RISPERDAL) 1 MG tablet Take 1 tablet (1 mg total) by mouth 2 (two) times daily. 09/07/17   Desiree Hane, MD  senna-docusate (SENOKOT-S) 8.6-50 MG tablet Take 1 tablet by mouth at bedtime as needed for mild constipation. 09/07/17   Desiree Hane, MD    Family History Family History  Problem Relation Age of Onset  . Alcohol abuse Mother   . Depression Mother   . Heart disease Mother   . Diabetes Mother   . Stroke Mother   . Coronary artery disease Father   . Diabetes Other        1/2 brother  . Hepatitis Brother     Social History Social History   Tobacco Use  . Smoking status: Light Tobacco Smoker    Packs/day: 0.50    Years: 30.00    Pack years: 15.00    Types: Cigarettes    Last attempt to quit: 12/15/2014    Years since quitting: 2.8  . Smokeless tobacco: Never Used  . Tobacco comment: 1/2 cig BID  Substance Use Topics  . Alcohol use: No    Alcohol/week: 0.0 standard drinks    Comment: Alcohol stopped in September of 2014  . Drug use: No     Allergies   Immune globulins; Ciprofloxacin; Flagyl [metronidazole]; Lisinopril; Sulfa antibiotics; Cefepime; Morphine and related; Requip  [ropinirole hcl]; Vancomycin; Betamethasone dipropionate; Bupropion hcl; Clobetasol; Codeine; Escitalopram oxalate; Fluoxetine hcl; Meclizine; Paroxetine; Penicillins; Tacrolimus; Tetanus toxoid; and Tuberculin purified protein derivative   Review of Systems Review of Systems  Constitutional: Positive for fatigue. Negative for chills, diaphoresis and fever.  HENT: Negative for congestion.  Eyes: Negative for visual disturbance.  Respiratory: Positive for cough, chest tightness and shortness of breath. Negative for hemoptysis, sputum production and wheezing.   Cardiovascular: Positive for leg swelling. Negative for chest pain.  Gastrointestinal: Negative for abdominal pain, constipation, diarrhea, nausea and vomiting.  Genitourinary: Negative for dysuria, flank pain and frequency.  Musculoskeletal: Negative for back pain, neck pain and neck stiffness.  Skin: Negative for rash and wound.  Neurological: Negative for light-headedness and headaches.  Psychiatric/Behavioral: Negative for agitation.     Physical Exam Updated Vital Signs BP 97/77   Pulse 76   Resp (!) 22   Ht 5\' 3"  (1.6 m)   Wt 96.2 kg   SpO2 96%   BMI 37.55 kg/m   Physical Exam  Constitutional: He is oriented to person, place, and time. He appears well-developed and well-nourished. No distress.  HENT:  Head: Normocephalic and atraumatic.  Mouth/Throat: Oropharynx is clear and moist. No oropharyngeal exudate.  Eyes: Pupils are equal, round, and reactive to light. Conjunctivae are normal.  Neck: Normal range of motion. Neck supple.  Cardiovascular: Normal rate and regular rhythm.  No murmur heard. Pulmonary/Chest: No stridor. Tachypnea noted. No respiratory distress. He has wheezes. He has rhonchi. He has rales. He exhibits no tenderness.  Abdominal: Soft. He exhibits no distension. There is no tenderness.  Musculoskeletal: He exhibits edema and tenderness.  Neurological: He is alert and oriented to person, place,  and time. No sensory deficit. He exhibits normal muscle tone.  Skin: Skin is warm and dry. Capillary refill takes less than 2 seconds. He is not diaphoretic. There is erythema. No pallor.  Psychiatric: He has a normal mood and affect.  Nursing note and vitals reviewed.    ED Treatments / Results  Labs (all labs ordered are listed, but only abnormal results are displayed) Labs Reviewed  CBC WITH DIFFERENTIAL/PLATELET - Abnormal; Notable for the following components:      Result Value   Hemoglobin 12.6 (*)    RDW 21.5 (*)    All other components within normal limits  COMPREHENSIVE METABOLIC PANEL - Abnormal; Notable for the following components:   Sodium 146 (*)    Glucose, Bld 101 (*)    Creatinine, Ser 1.30 (*)    Total Protein 5.8 (*)    Albumin 2.8 (*)    AST 11 (*)    GFR calc non Af Amer 55 (*)    All other components within normal limits  URINE CULTURE  MAGNESIUM  BRAIN NATRIURETIC PEPTIDE  URINALYSIS, ROUTINE W REFLEX MICROSCOPIC  I-STAT TROPONIN, ED  I-STAT CG4 LACTIC ACID, ED  I-STAT CG4 LACTIC ACID, ED    EKG EKG Interpretation  Date/Time:  Tuesday October 11 2017 14:49:32 EDT Ventricular Rate:  75 PR Interval:    QRS Duration: 91 QT Interval:  387 QTC Calculation: 433 R Axis:   0 Text Interpretation:  Sinus rhythm Low voltage, precordial leads When compared to prior, no significant changes seen.  No STEMI Confirmed by Antony Blackbird [phone]) on 10/11/2017 3:10:05 PM   Radiology Dg Chest Portable 1 View  Result Date: 10/11/2017 CLINICAL DATA:  67 year old male with hypotension. Bilateral lower extremity edema. EXAM: PORTABLE CHEST 1 VIEW COMPARISON:  09/17/2017 and earlier chest radiographs. FINDINGS: Portable AP semi upright view at 1436 hours. The patient is more rotated to the right. Mildly improved lung volumes and bibasilar ventilation. No pulmonary edema, pneumothorax or consolidation. No definite pleural effusion. Stable cardiac size and mediastinal  contours. Cardiac size within normal  limits. Visualized tracheal air column is within normal limits. Negative visible bowel gas pattern. No acute osseous abnormality identified. IMPRESSION: No acute cardiopulmonary abnormality. Electronically Signed   By: Genevie Ann M.D.   On: 10/11/2017 14:49    Procedures Procedures (including critical care time)  CRITICAL CARE Performed by: Gwenyth Allegra Tegeler Total critical care time: 35 minutes Critical care time was exclusive of separately billable procedures and treating other patients. Critical care was necessary to treat or prevent imminent or life-threatening deterioration. Critical care was time spent personally by me on the following activities: development of treatment plan with patient and/or surrogate as well as nursing, discussions with consultants, evaluation of patient's response to treatment, examination of patient, obtaining history from patient or surrogate, ordering and performing treatments and interventions, ordering and review of laboratory studies, ordering and review of radiographic studies, pulse oximetry and re-evaluation of patient's condition.   Medications Ordered in ED Medications  sodium chloride 0.9 % bolus 250 mL (has no administration in time range)  iopamidol (ISOVUE-370) 76 % injection (has no administration in time range)  iopamidol (ISOVUE-370) 76 % injection (has no administration in time range)  iopamidol (ISOVUE-370) 76 % injection (has no administration in time range)  heparin bolus via infusion 4,000 Units (has no administration in time range)  heparin ADULT infusion 100 units/mL (25000 units/261mL sodium chloride 0.45%) (has no administration in time range)  iopamidol (ISOVUE-370) 76 % injection 100 mL (100 mLs Intravenous Contrast Given 10/11/17 1724)     Initial Impression / Assessment and Plan / ED Course  I have reviewed the triage vital signs and the nursing notes.  Pertinent labs & imaging results that  were available during my care of the patient were reviewed by me and considered in my medical decision making (see chart for details).  Clinical Course as of Oct 11 2037  Tue Oct 11, 2017  1753 Notified that IV infiltrated.  Pt was unable to get the CT scan   [JK]    Clinical Course User Index [JK] Dorie Rank, MD    KHARON HIXON is a 67 y.o. male with a past medical history significant for CHF, asthma, COPD not on home oxygen, atrial fibrillation, diabetes, CAD with prior MI, hyperlipidemia, fibromyalgia, and prior DVT not on anticoagulation who presents with worsening bilateral lower extremity edema, fatigue, generalized weakness, and hypotension.  According to EMS report to nursing, patient was hypotensive with initial blood pressure of 80 systolic.  Patient was given 400 cc of fluid in route with some improvement in his blood pressure into the 90s on arrival.  Patient was also on 2 L nasal cannula for hypoxia which he does not usually take at home.  Patient says that over the last several days he has had worsening fatigue and lower extremity swelling.  He reports no significant fevers or chills.  He reports fatigue.  He denies chest pain but does report some shortness of breath.  He denies nausea, vomiting, or palpitations.  He does report dry cough.  No abdominal pain or changes in chronic back pain.  No recent injuries.  On exam, patient has bilateral significant lower extremity edema.  There is also some erythema but it does not appear cellulitic.  Patient's abdomen was nontender and chest was nontender.  Patient had rhonchi and crackles in both lungs.  Patient was on 2 L nasal cannula.  Patient had no numbness throughout but had generalized weakness in his legs.  Patient reports she is unable  to stand or walk due to the fatigue and generalized weakness.  Clinically I am concerned about either DVT or PE given his augmentation of prior DVT not on anticoagulation.  I am also concerned about  fluid overload and congestive heart failure exacerbation leading to the fatigue, generalized edema, and his hypoxia.  Patient's blood pressure was in the low 90s on arrival and at one point dropped into the 50s.  It was rechecked and had improved back into the 90s.  Patient was given a very small amount of fluids initially.  I anticipate patient will require admission for his new hypoxia, soft blood pressures, and likely fluid overload requiring close fluid management.  4:12 PM Chest x-ray showed no acute ab normalities.  Clinically I am very concerned about fluid overload despite with the said.  Patient blood pressure improved into the 90s after small amount of fluids.  Initial troponin was negative.  Magnesium not elevated or too low.  CBC shows mild anemia but no leukocytosis.  At about panel showed slight worsening of creatinine and hypernatremia.  BNP is still in process.    Patient is awaiting results of BNP and his CTA to look for pulmonary embolism.  Patient is also waiting for results of his DVT ultrasounds.    Patient will require admission for likely CHF exacerbation contributing to his hypoxia and his hypotension.  Care transferred to Dr. Tomi Bamberger while awaiting the results of his work-up.  Final Clinical Impressions(s) / ED Diagnoses   Final diagnoses:  Fatigue, unspecified type  Cough  Shortness of breath  Peripheral edema     Clinical Impression: 1. Fatigue, unspecified type   2. Cough   3. Shortness of breath   4. Peripheral edema     Disposition: Care transferred to Dr. Tomi Bamberger while awaiting reassessment after work-up.  This note was prepared with assistance of Systems analyst. Occasional wrong-word or sound-a-like substitutions may have occurred due to the inherent limitations of voice recognition software.      Tegeler, Gwenyth Allegra, MD 10/11/17 1617    Tegeler, Gwenyth Allegra, MD 10/11/17 2039

## 2017-10-11 NOTE — Progress Notes (Signed)
New Admission Note:   Arrival Method: From [facility] ED via stretcher Mental Orientation: Alert and oriented Telemetry: Box #20 Assessment: Completed Skin: See doc flowsheet IV: L upper arm with heparin at 13cc/hr infusing well Pain: Denies Tubes: Safety Measures: Safety Fall Prevention Plan has been discussed. Admission: Completed 5 MW Orientation: Patient has been orientated to the room, unit and staff.  Family: None at bedside  Orders have been reviewed and implemented. Will continue to monitor the patient. Call light has been placed within reach and bed alarm has been activated.   Patrick Holt [employer], RN-BC Phone number: [phone]

## 2017-10-11 NOTE — Progress Notes (Signed)
ANTICOAGULATION CONSULT NOTE - Initial Consult  Pharmacy Consult for heparin dosing Indication: pulmonary embolus  Allergies  Allergen Reactions  . Immune Globulins Other (See Comments)    Acute renal failure  . Ciprofloxacin Swelling  . Flagyl [Metronidazole] Swelling  . Lisinopril Diarrhea  . Sulfa Antibiotics Other (See Comments)    blisters  . Cefepime Other (See Comments)    Diffuse maculopapular rash when receiving both vanc and cefepime, timing unclear.  . Morphine And Related Other (See Comments)    Ineffective for pain management  . Requip [Ropinirole Hcl] Other (See Comments)    constipation  . Vancomycin     Diffuse maculopapular rash when receiving both vanc/cefepime.  Timing unclear.    . Betamethasone Dipropionate Other (See Comments)    Unknown  . Bupropion Hcl Other (See Comments)    Unknown  . Clobetasol Other (See Comments)    Unknown  . Codeine Other (See Comments)    Does not help with pain  . Escitalopram Oxalate Other (See Comments)    Unknown  . Fluoxetine Hcl Other (See Comments)    Unknown  . Meclizine Rash  . Paroxetine Other (See Comments)    Unknown  . Penicillins Other (See Comments)    Unknown reaction Has patient had a PCN reaction causing immediate rash, facial/tongue/throat swelling, SOB or lightheadedness with hypotension: unknown Has patient had a PCN reaction causing severe rash involving mucus membranes or skin necrosis: unknown Has patient had a PCN reaction that required hospitalization unknown Has patient had a PCN reaction occurring within the last 10 years: unknown If all of the above answers are "NO", then may proceed with Cephalosporin use. Unknown Unknown reactio  . Tacrolimus Other (See Comments)    Unknown  . Tetanus Toxoid Other (See Comments)    Unknown  . Tuberculin Purified Protein Derivative Other (See Comments)    Unknown    Patient Measurements: Height: 5\' 3"  (160 cm) Weight: 212 lb (96.2 kg) IBW/kg  (Calculated) : 56.9 Heparin Dosing Weight: 78.6 kg  Vital Signs: BP: 106/79 (08/27 1912) Pulse Rate: 67 (08/27 1912)  Labs: Recent Labs    10/11/17 1454  HGB 12.6*  HCT 41.5  PLT 200  CREATININE 1.30*    Estimated Creatinine Clearance: 55.8 mL/min (A) (by C-G formula based on SCr of 1.3 mg/dL (H)).   Medical History: Past Medical History:  Diagnosis Date  . Anemia   . Anxiety   . Asthma   . Atrial fibrillation ([place]) 01/2012   with RVR  . Bipolar 1 disorder ([place])   . Cancer ([place])   . Collagen vascular disease ([place])   . Conversion disorder 06/2010  . COPD (chronic obstructive pulmonary disease) ([place])   . Dermatomyositis ([place])   . Dermatomyositis ([place])   . Diverticulitis   . Diverticulosis of colon   . Dysrhythmia    "irregular" (11/15/2012)  . Esophageal dysmotility   . Esophageal stricture   . Fibromyalgia   . Gastritis   . GERD (gastroesophageal reflux disease)   . Hand fracture, right    sept 13, 2017, still wearing splint as og 01-10-2017  . Headache(784.0)    "severe; get them often" (11/15/2012)  . Hiatal hernia   . History of narcotic addiction ([place])   . Hx of adenomatous colonic polyps   . Hyperlipidemia   . Hypertension   . Internal hemorrhoids   . Ischemic heart disease   . Major depression    with acute psychotic break in 06/2010  .  Myocardial infarct ([place])    mulitple (1999, 2000, 2004)  . Narcotic dependence ([place])   . Nephrolithiasis   . Obesity   . OCD (obsessive compulsive disorder)   . Otosclerosis   . Paroxysmal A-fib ([place])   . Peripheral neuropathy   . Raynaud's disease   . Renal insufficiency   . Rheumatoid arthritis(714.0)   . Sarcoidosis   . Seizures ([place])   . Subarachnoid hemorrhage ([place]) 01/2012   with subdural  hematoma.   . Type II diabetes mellitus ([place])   . Urge incontinence   . Vertigo     Medications:  Scheduled:  . heparin  4,000 Units Intravenous Once  . iopamidol      . iopamidol      . iopamidol        Infusions:  . heparin    . sodium chloride      Assessment: [name] presents with shortness of breath, cough, leg pain and leg swelling starting 3 days ago. PMH includes asthma, COPD, CAD, heart failure, and DVT. Chest CT shows evidence of pulmonary embolus with emboli extending to lower lobe. Pharmacy consulted to dose heparin for PE treatment.    Goal of Therapy:  Heparin level 0.3-0.7 units/ml Monitor platelets by anticoagulation protocol: Yes   Plan:  Give 4000 units bolus x 1 Start heparin infusion at 1300 units/hr Check anti-Xa level in 6 hours and daily while on heparin Continue to monitor H&H and platelets  Gwenlyn Found, Sherian Rein D PGY1 Pharmacy Resident  Phone [phone] 10/11/2017   8:08 PM

## 2017-10-11 NOTE — Progress Notes (Signed)
*  Preliminary Results* Bilateral lower extremity venous duplex completed. There is no obvious evidence of acute deep vein thrombosis involving visualized veins of bilateral lower extremities.  10/11/2017 5:00 PM Maudry Mayhew, MHA, RVT, RDCS, RDMS

## 2017-10-11 NOTE — Progress Notes (Signed)
Called ED RN to get report,RN with another patient and unable to give report at this time. Will attempt again later. Torry Istre, Wonda Cheng, Therapist, sports

## 2017-10-11 NOTE — Progress Notes (Signed)
Received report from ED RN. Room ready for patient. Providence Stivers Joselita, RN 

## 2017-10-11 NOTE — ED Provider Notes (Addendum)
Clinical Course as of Oct 11 1808  Tue Oct 11, 2017  1753 Notified that IV infiltrated.  Pt was unable to get the CT scan   [JK]    Clinical Course User Index [JK] Dorie Rank, MD   Pt remains stable.  Normal perfusion in his extremity.   BP remains borderline low.  Vascular study negative for DVT.  Suspicion of PE is lower with the negative dvt study.  Will consult with medical service.   Dorie Rank, MD 10/11/17 1811  I was notified verbally by radiology  that Pt's CT scan is positive for a PE.  IV heparin ordered.   Dorie Rank, MD 10/11/17 [phone]

## 2017-10-11 NOTE — ED Notes (Signed)
Pt went to CT

## 2017-10-11 NOTE — H&P (Signed)
History and Physical    SARGENT MANKEY ZOX:[id number] DOB: 07-Dec-1950 DOA: 10/11/2017  Referring MD/NP/PA: Dr. Marye Round  PCP: [facility]   Outpatient Specialists: None  Patient coming from: Home  Chief Complaint: High blood pressure  HPI: Patrick Holt is a 67 y.o. male with medical history significant of multiple comorbidities including atrial fibrillation, diastolic dysfunction CHF, COPD, conversion disorder, asthma, right heart failure, chronic lower extremity stasis dermatitis who presented to the ER with generalized weakness, shortness of breath, cough as well as hypotension.  Patient was visibly fluid overloaded but blood pressure was in the 80s on arrival.  He has bilateral lower extremity edema as well.  He denied any chest pain.  Denied any diaphoresis.  His shortness of breath is above his typical limits.  He has some cough but nonproductive.  Patient was seen and evaluated in the ER with a picture of anasarca but evidence of right heart failure in the setting of hypotension and palpitation.  He was further evaluated and found to have findings consistent with pulmonary embolism hence he is being admitted for work-up and treatment.  ED Course: She has vitals include temperature of 98, BP is 154/93 pulse is 76, respiratory rates 22 and Sats 94% and wbc 6.7, hb of 12.6, plts 200, Sodium is 146, potassium of 3.9, creatinine is 1.30 and glucose of 101.  Lactic acid 0.80 CT angiogram of the chest confirms pulmonary embolism, it shows Acute saddle pulmonary embolus with emboli extending into the left lower lobe are artery and multiple left lower lobe segmental branches and into all right-sided lobar branches and multiple proximal right segmental branches. Bilateral lower extremity Doppler ultrasound showed no evidence of DVT  Review of Systems: As per HPI otherwise 10 point review of systems negative.    Past Medical History:  Diagnosis Date  . Anemia   .  Anxiety   . Asthma   . Atrial fibrillation ([place]) 01/2012   with RVR  . Bipolar 1 disorder ([place])   . Cancer ([place])   . Collagen vascular disease ([place])   . Conversion disorder 06/2010  . COPD (chronic obstructive pulmonary disease) ([place])   . Dermatomyositis ([place])   . Dermatomyositis ([place])   . Diverticulitis   . Diverticulosis of colon   . Dysrhythmia    "irregular" (11/15/2012)  . Esophageal dysmotility   . Esophageal stricture   . Fibromyalgia   . Gastritis   . GERD (gastroesophageal reflux disease)   . Hand fracture, right    sept 13, 2017, still wearing splint as og 01-10-2017  . Headache(784.0)    "severe; get them often" (11/15/2012)  . Hiatal hernia   . History of narcotic addiction ([place])   . Hx of adenomatous colonic polyps   . Hyperlipidemia   . Hypertension   . Internal hemorrhoids   . Ischemic heart disease   . Major depression    with acute psychotic break in 06/2010  . Myocardial infarct ([place])    mulitple (1999, 2000, 2004)  . Narcotic dependence ([place])   . Nephrolithiasis   . Obesity   . OCD (obsessive compulsive disorder)   . Otosclerosis   . Paroxysmal A-fib ([place])   . Peripheral neuropathy   . Raynaud's disease   . Renal insufficiency   . Rheumatoid arthritis(714.0)   . Sarcoidosis   . Seizures ([place])   . Subarachnoid hemorrhage ([place]) 01/2012   with subdural  hematoma.   . Type II diabetes mellitus ([place])   .  Urge incontinence   . Vertigo     Past Surgical History:  Procedure Laterality Date  . BACK SURGERY    . CARDIAC CATHETERIZATION    . CARPAL TUNNEL RELEASE Bilateral   . CATARACT EXTRACTION W/ INTRAOCULAR LENS IMPLANT Left   . COLONOSCOPY N/A 09/27/2012   Procedure: COLONOSCOPY;  Surgeon: Lafayette Dragon, MD;  Location: [facility];  Service: Endoscopy;  Laterality: N/A;  . ESOPHAGOGASTRODUODENOSCOPY N/A 09/27/2012   Procedure: ESOPHAGOGASTRODUODENOSCOPY (EGD);  Surgeon: Lafayette Dragon, MD;  Location: [facility];  Service: Endoscopy;  Laterality:  N/A;  . KNEE ARTHROSCOPY W/ MENISCAL REPAIR Left 2009  . LUMBAR DISC SURGERY    . LYMPH NODE DISSECTION Right 1970's   "neck; dr thought I had Hodgkins; turned out to be sarcoidosis" (11/15/2012)  . squamous papilloma   2010   removed by Dr. Constance Holster ENT, noted on tongue  . TONSILLECTOMY       reports that he has been smoking cigarettes. He has a 15.00 pack-year smoking history. He has never used smokeless tobacco. He reports that he does not drink alcohol or use drugs.  Allergies  Allergen Reactions  . Immune Globulins Other (See Comments)    Acute renal failure  . Rho (D) Immune Globulin     Acute renal failure Acute renal failure  . Ciprofloxacin Swelling  . Flagyl [Metronidazole] Swelling  . Lisinopril Diarrhea  . Sulfa Antibiotics Other (See Comments)    blisters  . Sulfasalazine     blisters blisters  . Cefepime Other (See Comments)    Diffuse maculopapular rash when receiving both vanc and cefepime, timing unclear.  . Morphine And Related Other (See Comments)    Ineffective for pain management  . Requip [Ropinirole Hcl] Other (See Comments)    constipation  . Vancomycin     Diffuse maculopapular rash when receiving both vanc/cefepime.  Timing unclear.    . Betamethasone Dipropionate Other (See Comments)    Unknown  . Betamethasone Dipropionate     Unknown  . Bupropion     Unknown  . Bupropion Hcl Other (See Comments)    Unknown  . Clobetasol Other (See Comments)    Unknown Unknown  . Codeine Other (See Comments)    Does not help with pain  . Escitalopram Oxalate Other (See Comments)    Unknown  . Escitalopram Oxalate     Unknown  . Fluoxetine Hcl Other (See Comments)    Unknown  . Fluoxetine Hcl     Unknown  . Furosemide Other (See Comments)    Unknown Unknown  . Meclizine Rash  . Paroxetine Other (See Comments)    Unknown  . Penicillins Other (See Comments)    Unknown reaction Has patient had a PCN reaction causing immediate rash,  facial/tongue/throat swelling, SOB or lightheadedness with hypotension: unknown Has patient had a PCN reaction causing severe rash involving mucus membranes or skin necrosis: unknown Has patient had a PCN reaction that required hospitalization unknown Has patient had a PCN reaction occurring within the last 10 years: unknown If all of the above answers are "NO", then may proceed with Cephalosporin use. Unknown Unknown reactio  . Tacrolimus Other (See Comments)    Unknown  . Tetanus Toxoid Other (See Comments)    Unknown  . Tuberculin Purified Protein Derivative Other (See Comments)    Unknown    Family History  Problem Relation Age of Onset  . Alcohol abuse Mother   . Depression Mother   . Heart disease Mother   .  Diabetes Mother   . Stroke Mother   . Coronary artery disease Father   . Diabetes Other        1/2 brother  . Hepatitis Brother     Prior to Admission medications   Medication Sig Start Date End Date Taking? Authorizing Provider  ALPRAZolam Duanne Moron) 0.5 MG tablet Take 1 tablet (0.5 mg total) by mouth 3 (three) times daily. 09/20/17  Yes Patrecia Pour, MD  amiodarone (PACERONE) 200 MG tablet Take 1 tablet (200 mg total) by mouth daily. 09/07/17  Yes Oretha Milch D, MD  aspirin EC 81 MG tablet Take 1 tablet (81 mg total) by mouth daily. 09/07/17  Yes Oretha Milch D, MD  atorvastatin (LIPITOR) 40 MG tablet Take 1 tablet (40 mg total) by mouth daily. 09/07/17  Yes Oretha Milch D, MD  budesonide-formoterol (SYMBICORT) 160-4.5 MCG/ACT inhaler Inhale 2 puffs into the lungs 2 (two) times daily. 09/07/17  Yes Oretha Milch D, MD  divalproex (DEPAKOTE) 250 MG DR tablet Take 1 tablet (250 mg total) by mouth 3 (three) times daily. 09/07/17  Yes Oretha Milch D, MD  ergocalciferol (VITAMIN D2) 50000 units capsule Take 1 capsule (50,000 Units total) by mouth once a week. 09/07/17  Yes Oretha Milch D, MD  esomeprazole (NEXIUM) 40 MG capsule Take 1 capsule (40 mg total) by mouth  daily. 09/07/17  Yes Oretha Milch D, MD  ipratropium-albuterol (DUONEB) 0.5-2.5 (3) MG/3ML SOLN Inhale 3 mLs into the lungs every 6 (six) hours as needed. 09/07/17  Yes Oretha Milch D, MD  levETIRAcetam (KEPPRA) 500 MG tablet Take 1 tablet (500 mg total) by mouth 2 (two) times daily. 09/07/17  Yes Oretha Milch D, MD  loperamide (IMODIUM) 2 MG capsule Take 1 capsule (2 mg total) by mouth as needed for diarrhea or loose stools. 09/07/17  Yes Oretha Milch D, MD  Multiple Vitamin (MULTIVITAMIN WITH MINERALS) TABS tablet Take 1 tablet by mouth daily. 09/07/17  Yes Oretha Milch D, MD  oxybutynin (DITROPAN) 5 MG tablet Take 1 tablet (5 mg total) by mouth 3 (three) times daily. 09/07/17  Yes Oretha Milch D, MD  Potassium Chloride ER 20 MEQ TBCR Take 20 mEq by mouth every other day. 09/20/17  Yes Patrecia Pour, MD  risperiDONE (RISPERDAL) 1 MG tablet Take 1 tablet (1 mg total) by mouth 2 (two) times daily. 09/07/17  Yes Oretha Milch D, MD  senna-docusate (SENOKOT-S) 8.6-50 MG tablet Take 1 tablet by mouth at bedtime as needed for mild constipation. 09/07/17  Yes Desiree Hane, MD  predniSONE (DELTASONE) 10 MG tablet Take 1 tablet (10 mg total) by mouth daily with breakfast. Patient not taking: Reported on 10/11/2017 09/20/17   Patrecia Pour, MD    Physical Exam: Vitals:   10/11/17 1835 10/11/17 1838 10/11/17 1912 10/11/17 2135  BP:  (!) 98/52 106/79 (!) 154/93  Pulse: 69 73 67 63  Resp: 14 15 14 16   Temp:    98.1 F (36.7 C)  TempSrc:    Oral  SpO2: 97% 95% 97% 98%  Weight:    97.2 kg  Height:    5\' 7"  (1.702 m)      Constitutional: NAD, calm, comfortable Vitals:   10/11/17 1835 10/11/17 1838 10/11/17 1912 10/11/17 2135  BP:  (!) 98/52 106/79 (!) 154/93  Pulse: 69 73 67 63  Resp: 14 15 14 16   Temp:    98.1 F (36.7 C)  TempSrc:    Oral  SpO2: 97% 95% 97% 98%  Weight:    97.2 kg  Height:    5\' 7"  (1.702 m)   Eyes: PERRL, lids and conjunctivae normal ENMT: Mucous membranes are  moist. Posterior pharynx clear of any exudate or lesions.Normal dentition.  Neck: normal, supple, no masses, no thyromegaly Respiratory: clear to auscultation bilaterally, no wheezing, diffuse crackles. Normal respiratory effort. No accessory muscle use.  Cardiovascular: Regular rate and rhythm, no murmurs / rubs / gallops. 2+ edema. 2+ pedal pulses. No carotid bruits.  Abdomen: no tenderness, no masses palpated. No hepatosplenomegaly. Bowel sounds positive.  Musculoskeletal: no clubbing / cyanosis. No joint deformity upper and lower extremities. Good ROM, no contractures. Normal muscle tone.  Skin: no rashes, lesions, ulcers. No induration Neurologic: CN 2-12 grossly intact. Sensation intact, DTR normal. Strength 5/5 in all 4.  Psychiatric: Normal judgment and insight. Alert and oriented x 3. Normal mood.   Labs on Admission: I have personally reviewed following labs and imaging studies  CBC: Recent Labs  Lab 10/11/17 1454  WBC 6.7  NEUTROABS 3.9  HGB 12.6*  HCT 41.5  MCV 92.8  PLT 272   Basic Metabolic Panel: Recent Labs  Lab 10/11/17 1454  NA 146*  K 3.9  CL 109  CO2 29  GLUCOSE 101*  BUN 14  CREATININE 1.30*  CALCIUM 9.0  MG 2.0   GFR: Estimated Creatinine Clearance: 60.4 mL/min (A) (by C-G formula based on SCr of 1.3 mg/dL (H)). Liver Function Tests: Recent Labs  Lab 10/11/17 1454  AST 11*  ALT 11  ALKPHOS 65  BILITOT 1.1  PROT 5.8*  ALBUMIN 2.8*   No results for input(s): LIPASE, AMYLASE in the last 168 hours. No results for input(s): AMMONIA in the last 168 hours. Coagulation Profile: No results for input(s): INR, PROTIME in the last 168 hours. Cardiac Enzymes: No results for input(s): CKTOTAL, CKMB, CKMBINDEX, TROPONINI in the last 168 hours. BNP (last 3 results) No results for input(s): PROBNP in the last 8760 hours. HbA1C: No results for input(s): HGBA1C in the last 72 hours. CBG: No results for input(s): GLUCAP in the last 168 hours. Lipid  Profile: No results for input(s): CHOL, HDL, LDLCALC, TRIG, CHOLHDL, LDLDIRECT in the last 72 hours. Thyroid Function Tests: No results for input(s): TSH, T4TOTAL, FREET4, T3FREE, THYROIDAB in the last 72 hours. Anemia Panel: No results for input(s): VITAMINB12, FOLATE, FERRITIN, TIBC, IRON, RETICCTPCT in the last 72 hours. Urine analysis:    Component Value Date/Time   COLORURINE YELLOW 09/04/2017 1750   APPEARANCEUR CLEAR 09/04/2017 1750   LABSPEC 1.010 09/04/2017 1750   PHURINE 7.0 09/04/2017 1750   GLUCOSEU NEGATIVE 09/04/2017 1750   HGBUR SMALL (A) 09/04/2017 1750   HGBUR negative 12/11/2009 1356   BILIRUBINUR NEGATIVE 09/04/2017 1750   BILIRUBINUR MODERATE 06/05/2015 1407   KETONESUR 5 (A) 09/04/2017 1750   PROTEINUR NEGATIVE 09/04/2017 1750   UROBILINOGEN 2.0 06/05/2015 1407   UROBILINOGEN 1.0 12/30/2014 1830   NITRITE NEGATIVE 09/04/2017 1750   LEUKOCYTESUR NEGATIVE 09/04/2017 1750   Sepsis Labs: @LABRCNTIP (procalcitonin:4,lacticidven:4) )No results found for this or any previous visit (from the past 240 hour(s)).   Radiological Exams on Admission: Ct Angio Chest Pe W And/or Wo Contrast  Result Date: 10/11/2017 CLINICAL DATA:  Cough and shortness of breath EXAM: CT ANGIOGRAPHY CHEST WITH CONTRAST TECHNIQUE: Multidetector CT imaging of the chest was performed using the standard protocol during bolus administration of intravenous contrast. Multiplanar CT image reconstructions and MIPs were obtained to evaluate the vascular anatomy. CONTRAST:  151mL ISOVUE-370 IOPAMIDOL (  ISOVUE-370) INJECTION 76% COMPARISON:  CTA chest 04/06/2017 FINDINGS: Cardiovascular: --Pulmonary arteries: Contrast injection is sufficient to demonstrate satisfactory opacification of the pulmonary arteries to the segmental level.There is a saddle pulmonary embolus that extends along both the right and left pulmonary arteries and into the right upper lobar, inter lobar and left lower lobar arteries. Emboli  extend into proximal segmental branches of all right lobes and the left lower lobe. The main pulmonary artery is within normal limits for size. No evidence of right heart strain. --Aorta: Limited opacification of the aorta due to bolus timing optimization for the pulmonary arteries. Conventional 3 vessel aortic branching pattern. The aortic course and caliber are normal. There is mild aortic atherosclerosis. --Heart: Normal size. No pericardial effusion. Mediastinum/Nodes: No mediastinal, hilar or axillary lymphadenopathy. The visualized thyroid and thoracic esophageal course are unremarkable. Lungs/Pleura: There is bibasilar atelectasis. No pleural effusion or pneumothorax. No evidence of acute pulmonary infarction. Upper Abdomen: Contrast bolus timing is not optimized for evaluation of the abdominal organs. Within this limitation, the visualized organs of the upper abdomen are normal. Musculoskeletal: There is extravasated contrast in the visualized right arm extending into the right axilla. Review of the MIP images confirms the above findings. IMPRESSION: 1. Acute saddle pulmonary embolus with emboli extending into the left lower lobe are artery and multiple left lower lobe segmental branches and into all right-sided lobar branches and multiple proximal right segmental branches. 2. No CT evidence of right heart strain. 3. Extravasated contrast material within the right arm and axilla. Critical Value/emergent results were called by telephone at the time of interpretation on 10/11/2017 at 7:42 pm to Dr. Tomi Bamberger, who verbally acknowledged these results. Aortic Atherosclerosis (ICD10-I70.0). Electronically Signed   By: Ulyses Jarred M.D.   On: 10/11/2017 19:43   Dg Chest Portable 1 View  Result Date: 10/11/2017 CLINICAL DATA:  67 year old male with hypotension. Bilateral lower extremity edema. EXAM: PORTABLE CHEST 1 VIEW COMPARISON:  09/17/2017 and earlier chest radiographs. FINDINGS: Portable AP semi upright view  at 1436 hours. The patient is more rotated to the right. Mildly improved lung volumes and bibasilar ventilation. No pulmonary edema, pneumothorax or consolidation. No definite pleural effusion. Stable cardiac size and mediastinal contours. Cardiac size within normal limits. Visualized tracheal air column is within normal limits. Negative visible bowel gas pattern. No acute osseous abnormality identified. IMPRESSION: No acute cardiopulmonary abnormality. Electronically Signed   By: Genevie Ann M.D.   On: 10/11/2017 14:49    EKG: Independently reviewed.  Shows sinus rhythm with rate of 74.  No significant ST changes  Assessment/Plan Principal Problem:   Pulmonary embolism (HCC) Active Problems:   DEPRESSION, MAJOR   Tobacco abuse   COPD (chronic obstructive pulmonary disease) (HCC)   GERD   History of seizure disorder   Essential hypertension, benign   Hypernatremia   PAF (paroxysmal atrial fibrillation) (HCC)   ARF (acute renal failure) ([place])   Anasarca    #1 acute saddle pulmonary embolus: This most likely is responsible for hypotension.  Patient has received normal saline bolus in the ER.  His right heart failure is probably responsible for his lower extremity edema.  Not sure if he has any more strain but no CT evidence.  Echocardiogram will be ordered.  Heparin drip has been initiated.  He will be transition to oral anticoagulation.  #2 paroxysmal atrial fibrillation: Rate is controlled in the moment.  He is in sinus rhythm.  Currently on heparin drip.  On amiodarone  #3 anasarca: Secondary  to right heart failure.  Patient has also low albumin so could be contributing.  Has acute kidney injury with creatinine 1.3 baseline is 0.8 also.  This may also contribute.  We will hold off on diuresis due to relative hypotension.  #4 COPD: No acute exacerbation.  Empiric nebulizer treatments.  #5 tobacco abuse: Counseling provided.  Will offer nicotine patch.  #6 GERD: Continue with PPIs  #7  seizure disorder: Continue with Keppra and Depakote.  #8 acute kidney injury: Probably secondary to dehydration and hypotension.  Hydrate and monitor renal function  #9 essential hypertension: Continue with home regimen and monitor blood pressure.  DVT prophylaxis: Heparin  Code Status: Full code  Family Communication: No family available  Disposition Plan: To be determined  Consults called: None  Admission status: Inpatient to telemetry  Severity of Illness: The appropriate patient status for this patient is INPATIENT. Inpatient status is judged to be reasonable and necessary in order to provide the required intensity of service to ensure the patient's safety. The patient's presenting symptoms, physical exam findings, and initial radiographic and laboratory data in the context of their chronic comorbidities is felt to place them at high risk for further clinical deterioration. Furthermore, it is not anticipated that the patient will be medically stable for discharge from the hospital within 2 midnights of admission. The following factors support the patient status of inpatient.   " The patient's presenting symptoms include shortness of breath and cough with hypotension. " The worrisome physical exam findings include anasarca with bilateral basal crackles. " The initial radiographic and laboratory data are worrisome because of CT angiogram showing saddle pulmonary embolism. " The chronic co-morbidities include COPD with atrial fibrillation and right heart failure.   * I certify that at the point of admission it is my clinical judgment that the patient will require inpatient hospital care spanning beyond 2 midnights from the point of admission due to high intensity of service, high risk for further deterioration and high frequency of surveillance required.Barbette Merino MD [facility] Pager [phone]  If 7PM-7AM, please contact night-coverage [email or web address] Password  [email or web address]  10/11/2017, 10:10 PM

## 2017-10-11 NOTE — Progress Notes (Signed)
This patient has received 50 ml's of IV Isovue 370 contrast extravasation into right upper armduring a CTA of the chest exam.  The exam was performed on 10/11/2017.  Site / affected area assessed by me. Patient complains of mild pain at the site of extravasation. The right upper arm compartments are soft. No skin changes. Right arm and hand motor and sensation intact.  Patient instructed to elevate arm and apply cold compresses as needed. Patient counseled to watch for worsening pain, skin changes, or numbness/tingling and decreased strength in the right arm and hand.

## 2017-10-11 NOTE — Progress Notes (Signed)
Patient had 50ccs isovue 370 extravasation to the right upper arm. 20g IV was removed, arm was elevated, verbal instructions given to pt via Dr Nelia Shi, and extravasation orders were placed.

## 2017-10-12 ENCOUNTER — Inpatient Hospital Stay (HOSPITAL_COMMUNITY): Payer: Medicare Other

## 2017-10-12 DIAGNOSIS — I2602 Saddle embolus of pulmonary artery with acute cor pulmonale: Principal | ICD-10-CM

## 2017-10-12 DIAGNOSIS — R601 Generalized edema: Secondary | ICD-10-CM

## 2017-10-12 DIAGNOSIS — N179 Acute kidney failure, unspecified: Secondary | ICD-10-CM

## 2017-10-12 LAB — CBC
HCT: 38.4 % — ABNORMAL LOW (ref 39.0–52.0)
HEMATOCRIT: 38.9 % — AB (ref 39.0–52.0)
Hemoglobin: 11.9 g/dL — ABNORMAL LOW (ref 13.0–17.0)
Hemoglobin: 11.9 g/dL — ABNORMAL LOW (ref 13.0–17.0)
MCH: 28.3 pg (ref 26.0–34.0)
MCH: 28.3 pg (ref 26.0–34.0)
MCHC: 31 g/dL (ref 30.0–36.0)
MCHC: 30.6 g/dL (ref 30.0–36.0)
MCV: 91.2 fL (ref 78.0–100.0)
MCV: 92.4 fL (ref 78.0–100.0)
Platelets: 195 10*3/uL (ref 150–400)
Platelets: 178 10*3/uL (ref 150–400)
RBC: 4.21 MIL/uL — ABNORMAL LOW (ref 4.22–5.81)
RBC: 4.21 MIL/uL — ABNORMAL LOW (ref 4.22–5.81)
RDW: 21.2 % — ABNORMAL HIGH (ref 11.5–15.5)
RDW: 21.2 % — AB (ref 11.5–15.5)
WBC: 5.5 10*3/uL (ref 4.0–10.5)
WBC: 6 10*3/uL (ref 4.0–10.5)

## 2017-10-12 LAB — COMPREHENSIVE METABOLIC PANEL
ALBUMIN: 2.5 g/dL — AB (ref 3.5–5.0)
ALT: 10 U/L (ref 0–44)
ANION GAP: 10 (ref 5–15)
AST: 11 U/L — ABNORMAL LOW (ref 15–41)
Alkaline Phosphatase: 56 U/L (ref 38–126)
BILIRUBIN TOTAL: 0.7 mg/dL (ref 0.3–1.2)
BUN: 14 mg/dL (ref 8–23)
CO2: 24 mmol/L (ref 22–32)
Calcium: 8.5 mg/dL — ABNORMAL LOW (ref 8.9–10.3)
Chloride: 110 mmol/L (ref 98–111)
Creatinine, Ser: 1.61 mg/dL — ABNORMAL HIGH (ref 0.61–1.24)
GFR calc non Af Amer: 42 mL/min — ABNORMAL LOW (ref >60)
GFR, EST AFRICAN AMERICAN: 49 mL/min — AB (ref >60)
GLUCOSE: 76 mg/dL (ref 70–99)
POTASSIUM: 3.9 mmol/L (ref 3.5–5.1)
Sodium: 144 mmol/L (ref 135–145)
TOTAL PROTEIN: 5.1 g/dL — AB (ref 6.5–8.1)

## 2017-10-12 LAB — HEPARIN LEVEL (UNFRACTIONATED)
HEPARIN UNFRACTIONATED: 0.3 [IU]/mL (ref 0.30–0.70)
Heparin Unfractionated: 0.51 IU/mL (ref 0.30–0.70)

## 2017-10-12 LAB — TROPONIN I: Troponin I: <0.03 ng/mL (ref <0.03)

## 2017-10-12 MED ORDER — ALPRAZOLAM 0.5 MG PO TABS
0.5000 mg | ORAL_TABLET | Freq: Three times a day (TID) | ORAL | Status: DC
  Administered 2017-10-12 – 2017-10-18 (×19): 0.5 mg via ORAL
  Filled 2017-10-12 (×19): qty 1

## 2017-10-12 MED ORDER — SODIUM CHLORIDE 0.9 % IV SOLN
INTRAVENOUS | Status: AC
  Administered 2017-10-12: 19:00:00 via INTRAVENOUS

## 2017-10-12 MED ORDER — SODIUM CHLORIDE 0.9 % IV SOLN: 250.0000 mL | INTRAVENOUS | Status: DC | PRN

## 2017-10-12 MED ORDER — IPRATROPIUM-ALBUTEROL 0.5-2.5 (3) MG/3ML IN SOLN
3.0000 mL | Freq: Four times a day (QID) | RESPIRATORY_TRACT | Status: DC | PRN
  Filled 2017-10-12: qty 3

## 2017-10-12 MED ORDER — ATORVASTATIN CALCIUM 40 MG PO TABS
40.0000 mg | ORAL_TABLET | Freq: Every day | ORAL | Status: DC
  Administered 2017-10-12 – 2017-10-18 (×7): 40 mg via ORAL
  Filled 2017-10-12 (×7): qty 1

## 2017-10-12 MED ORDER — RISPERIDONE 1 MG PO TABS
1.0000 mg | ORAL_TABLET | Freq: Two times a day (BID) | ORAL | Status: DC
  Administered 2017-10-12 – 2017-10-18 (×13): 1 mg via ORAL
  Filled 2017-10-12 (×14): qty 1

## 2017-10-12 MED ORDER — AMIODARONE HCL 200 MG PO TABS
200.0000 mg | ORAL_TABLET | Freq: Every day | ORAL | Status: DC
  Administered 2017-10-12 – 2017-10-18 (×7): 200 mg via ORAL
  Filled 2017-10-12 (×7): qty 1

## 2017-10-12 MED ORDER — VITAMIN D (ERGOCALCIFEROL) 1.25 MG (50000 UNIT) PO CAPS
50000.0000 [IU] | ORAL_CAPSULE | ORAL | Status: DC
  Administered 2017-10-14: 50000 [IU] via ORAL
  Filled 2017-10-12: qty 1

## 2017-10-12 MED ORDER — SODIUM CHLORIDE 0.9% FLUSH
3.0000 mL | Freq: Two times a day (BID) | INTRAVENOUS | Status: DC
  Administered 2017-10-12 – 2017-10-16 (×4): 3 mL via INTRAVENOUS

## 2017-10-12 MED ORDER — SODIUM CHLORIDE 0.9% FLUSH: 3.0000 mL | INTRAVENOUS | Status: DC | PRN

## 2017-10-12 MED ORDER — ONDANSETRON HCL 4 MG PO TABS: 4.0000 mg | ORAL_TABLET | Freq: Four times a day (QID) | ORAL | Status: DC | PRN

## 2017-10-12 MED ORDER — GUAIFENESIN ER 600 MG PO TB12
1200.0000 mg | ORAL_TABLET | Freq: Two times a day (BID) | ORAL | Status: DC
  Administered 2017-10-12 – 2017-10-18 (×13): 1200 mg via ORAL
  Filled 2017-10-12 (×13): qty 2

## 2017-10-12 MED ORDER — ADULT MULTIVITAMIN W/MINERALS CH
1.0000 | ORAL_TABLET | Freq: Every day | ORAL | Status: DC
  Administered 2017-10-12 – 2017-10-18 (×7): 1 via ORAL
  Filled 2017-10-12 (×7): qty 1

## 2017-10-12 MED ORDER — OXYBUTYNIN CHLORIDE 5 MG PO TABS
5.0000 mg | ORAL_TABLET | Freq: Three times a day (TID) | ORAL | Status: DC
  Administered 2017-10-12 – 2017-10-18 (×21): 5 mg via ORAL
  Filled 2017-10-12 (×20): qty 1

## 2017-10-12 MED ORDER — ASPIRIN EC 81 MG PO TBEC: 81.0000 mg | DELAYED_RELEASE_TABLET | Freq: Every day | ORAL | Status: DC

## 2017-10-12 MED ORDER — ONDANSETRON HCL 4 MG/2ML IJ SOLN
4.0000 mg | Freq: Four times a day (QID) | INTRAMUSCULAR | Status: DC | PRN
  Filled 2017-10-12: qty 2

## 2017-10-12 MED ORDER — DIVALPROEX SODIUM 250 MG PO DR TAB
250.0000 mg | DELAYED_RELEASE_TABLET | Freq: Three times a day (TID) | ORAL | Status: DC
  Administered 2017-10-12 – 2017-10-18 (×19): 250 mg via ORAL
  Filled 2017-10-12 (×21): qty 1

## 2017-10-12 MED ORDER — LEVETIRACETAM 500 MG PO TABS
500.0000 mg | ORAL_TABLET | Freq: Two times a day (BID) | ORAL | Status: DC
  Administered 2017-10-12 – 2017-10-18 (×14): 500 mg via ORAL
  Filled 2017-10-12 (×14): qty 1

## 2017-10-12 MED ORDER — POTASSIUM CHLORIDE CRYS ER 10 MEQ PO TBCR
20.0000 meq | EXTENDED_RELEASE_TABLET | ORAL | Status: DC
  Administered 2017-10-12 – 2017-10-18 (×4): 20 meq via ORAL
  Filled 2017-10-12 (×4): qty 2

## 2017-10-12 MED ORDER — MOMETASONE FURO-FORMOTEROL FUM 200-5 MCG/ACT IN AERO
2.0000 | INHALATION_SPRAY | Freq: Two times a day (BID) | RESPIRATORY_TRACT | Status: DC
  Administered 2017-10-12 – 2017-10-18 (×13): 2 via RESPIRATORY_TRACT
  Filled 2017-10-12: qty 8.8

## 2017-10-12 MED ORDER — SENNOSIDES-DOCUSATE SODIUM 8.6-50 MG PO TABS
1.0000 | ORAL_TABLET | Freq: Every evening | ORAL | Status: DC | PRN
  Administered 2017-10-12: 1 via ORAL
  Filled 2017-10-12 (×2): qty 1

## 2017-10-12 MED ORDER — LOPERAMIDE HCL 2 MG PO CAPS: 2.0000 mg | ORAL_CAPSULE | ORAL | Status: DC | PRN

## 2017-10-12 MED ORDER — PANTOPRAZOLE SODIUM 40 MG PO TBEC
40.0000 mg | DELAYED_RELEASE_TABLET | Freq: Every day | ORAL | Status: DC
  Administered 2017-10-12 – 2017-10-18 (×7): 40 mg via ORAL
  Filled 2017-10-12 (×7): qty 1

## 2017-10-12 NOTE — Progress Notes (Signed)
PROGRESS NOTE                                                                                                                                                                                                             Patient Demographics:    Patrick Holt, is a 67 y.o. male, DOB - Jul 07, 1950, PNT:[id number]  Admit date - 10/11/2017   Admitting Physician Elwyn Reach, MD  Outpatient Primary MD for the patient is [facility]  LOS - 1   Chief Complaint  Patient presents with  . Hypotension       Brief Narrative    Patrick Holt is a 67 y.o. male with medical history significant of multiple comorbidities including atrial fibrillation, diastolic dysfunction CHF, COPD, conversion disorder, asthma, right heart failure, chronic lower extremity stasis dermatitis who presented to the ER with generalized weakness, shortness of breath, cough as well as hypotension, work-up significant for acute PE   Subjective:    Patrick Holt today has, No headache, No chest pain, No abdominal pain , he does report some cough.   Assessment  & Plan :    Principal Problem:   Pulmonary embolism (HCC) Active Problems:   DEPRESSION, MAJOR   Tobacco abuse   COPD (chronic obstructive pulmonary disease) (HCC)   GERD   History of seizure disorder   Essential hypertension, benign   Hypernatremia   PAF (paroxysmal atrial fibrillation) (HCC)   ARF (acute renal failure) ([place])   Anasarca   acute saddle pulmonary embolus: -as Evident on CTA chest, patient has sedentary lifestyle,  following 2D echo to evaluate for right heart strain, for now continue with heparin GTT for the next 1 to 2 days, then can be transitioned to oral anticoagulation, continue with oxygen as needed .  paroxysmal atrial fibrillation: -Currently on sinus rhythm, rate controlled, on amiodarone, now started on anticoagulation for PE.  anasarca:  - Secondary to  right heart failure.  Patient has also low albumin so could be contributing.    Renal function increased to 1.6 today, hold on diuresis  AKI on CKD -Was 1.6 this morning, continue with IV fluids, hold nephrotoxic medication, this is most likely due to ATN from hypotension, monitor closely as he received contrast yesterday  COPD: No acute exacerbation.  Empiric nebulizer treatments.  tobacco abuse:  -  Counseling provided.  Will offer nicotine patch.  GERD: - Continue with PPIs  seizure disorder:  - Continue with Keppra and Depakote.  essential hypertension:   - Continue with home regimen and monitor blood pressure.    Code Status : Heparin GTT  Family Communication  : none at bedside  Disposition Plan  : pending PT  Consults  :  None  Procedures  : None  DVT Prophylaxis  :  Heparin GTT  Lab Results  Component Value Date   PLT 178 10/12/2017    Antibiotics  :    Anti-infectives (From admission, onward)   None        Objective:   Vitals:   10/11/17 2135 10/12/17 0456 10/12/17 0737 10/12/17 0906  BP: (!) 154/93 97/62 (!) 94/54   Pulse: 63 69 64   Resp: 16 18 20    Temp: 98.1 F (36.7 C) 98.2 F (36.8 C) 98 F (36.7 C)   TempSrc: Oral Oral Oral   SpO2: 98% 94% (!) 89% 90%  Weight: 97.2 kg     Height: 5\' 7"  (1.702 m)       Wt Readings from Last 3 Encounters:  10/11/17 97.2 kg  09/07/17 105.7 kg  06/16/17 86.2 kg     Intake/Output Summary (Last 24 hours) at 10/12/2017 1506 Last data filed at 10/12/2017 1300 Gross per 24 hour  Intake 1126.25 ml  Output 1000 ml  Net 126.25 ml     Physical Exam  Awake Alert, Oriented X 3, No new F.N deficits, Normal affect Symmetrical Chest wall movement, Good air movement bilaterally, CTAB RRR,No Gallops,Rubs or new Murmurs, No Parasternal Heave +ve B.Sounds, Abd Soft, No tenderness, No rebound - guarding or rigidity. No Cyanosis, Clubbing , bilateral lower extremity edema, right > left    Data Review:     CBC Recent Labs  Lab 10/11/17 1454 10/12/17 0225 10/12/17 0521  WBC 6.7 5.5 6.0  HGB 12.6* 11.9* 11.9*  HCT 41.5 38.4* 38.9*  PLT 200 195 178  MCV 92.8 91.2 92.4  MCH 28.2 28.3 28.3  MCHC 30.4 31.0 30.6  RDW 21.5* 21.2* 21.2*  LYMPHSABS 2.0  --   --   MONOABS 0.7  --   --   EOSABS 0.1  --   --   BASOSABS 0.0  --   --     Chemistries  Recent Labs  Lab 10/11/17 1454 10/12/17 0521  NA 146* 144  K 3.9 3.9  CL 109 110  CO2 29 24  GLUCOSE 101* 76  BUN 14 14  CREATININE 1.30* 1.61*  CALCIUM 9.0 8.5*  MG 2.0  --   AST 11* 11*  ALT 11 10  ALKPHOS 65 56  BILITOT 1.1 0.7   ------------------------------------------------------------------------------------------------------------------ No results for input(s): CHOL, HDL, LDLCALC, TRIG, CHOLHDL, LDLDIRECT in the last 72 hours.  Lab Results  Component Value Date   HGBA1C 5.9 (H) 01/14/2017   ------------------------------------------------------------------------------------------------------------------ No results for input(s): TSH, T4TOTAL, T3FREE, THYROIDAB in the last 72 hours.  Invalid input(s): FREET3 ------------------------------------------------------------------------------------------------------------------ No results for input(s): VITAMINB12, FOLATE, FERRITIN, TIBC, IRON, RETICCTPCT in the last 72 hours.  Coagulation profile No results for input(s): INR, PROTIME in the last 168 hours.  No results for input(s): DDIMER in the last 72 hours.  Cardiac Enzymes Recent Labs  Lab 10/12/17 0521 10/12/17 0834  TROPONINI <0.03 <0.03   ------------------------------------------------------------------------------------------------------------------    Component Value Date/Time   BNP 20.8 10/11/2017 1454    Inpatient Medications  Scheduled Meds: . ALPRAZolam  0.5 mg Oral TID  . amiodarone  200 mg Oral Daily  . atorvastatin  40 mg Oral Daily  . divalproex  250 mg Oral TID  . levETIRAcetam  500 mg  Oral BID  . mometasone-formoterol  2 puff Inhalation BID  . multivitamin with minerals  1 tablet Oral Daily  . nicotine  21 mg Transdermal Daily  . oxybutynin  5 mg Oral TID  . pantoprazole  40 mg Oral Daily  . potassium chloride  20 mEq Oral QODAY  . risperiDONE  1 mg Oral BID  . sodium chloride flush  3 mL Intravenous Q12H  . [START ON 10/14/2017] Vitamin D (Ergocalciferol)  50,000 Units Oral Weekly   Continuous Infusions: . sodium chloride    . heparin 1,350 Units/hr (10/12/17 1331)  . sodium chloride     PRN Meds:.sodium chloride, ipratropium-albuterol, loperamide, ondansetron **OR** ondansetron (ZOFRAN) IV, senna-docusate, sodium chloride flush  Micro Results No results found for this or any previous visit (from the past 240 hour(s)).  Radiology Reports Ct Head Wo Contrast  Result Date: 09/17/2017 CLINICAL DATA:  Patient fell last evening, now with lethargy. History of stroke. EXAM: CT HEAD WITHOUT CONTRAST CT CERVICAL SPINE WITHOUT CONTRAST TECHNIQUE: Multidetector CT imaging of the head and cervical spine was performed following the standard protocol without intravenous contrast. Multiplanar CT image reconstructions of the cervical spine were also generated. COMPARISON:  Head CT - 09/03/2017; head and C-spine CT-06/16/2017 FINDINGS: CT HEAD FINDINGS Brain: Moderately advanced atrophy with sulcal prominence and centralized volume loss with commensurate ex vacuo dilatation the ventricular system. Periventricular hypodensities compatible microvascular ischemic disease. Given background parenchymal abnormalities, there is no CT evidence superimposed acute large territory infarct. No intraparenchymal or extra-axial mass or hemorrhage. Unchanged size and configuration of the ventricles and the basilar cisterns. No midline shift. Vascular: Intracranial atherosclerosis. Skull: No displaced calvarial fracture. Sinuses/Orbits: Limited visualization of the paranasal sinuses and mastoid air cells is  normal. No air-fluid levels. A small amount of debris is seen within the bilateral external auditory canals. Post left-sided cataract surgery. Other: Regional soft tissues appear normal. CT CERVICAL SPINE FINDINGS Alignment: C1 to the superior endplate of T2 is imaged. Mild scoliotic curvature of the cervicothoracic spine, potentially positional. Otherwise, normal alignment of the cervical spine. No anterolisthesis or retrolisthesis. Bilateral facets are normally aligned. Skull base and vertebrae: The dens is normally positioned between the lateral masses of C1. Mild degenerative change of the atlantodental articulation. Normal atlantoaxial articulations. Cervical vertebral body heights are preserved. Old fracture involving the T1 spinous process (sagittal image 18, series 10) Soft tissues and spinal canal: Prevertebral soft tissues are normal. Disc levels: Cervical intervertebral disc space heights appear preserved. Upper chest: Limited visualization of lung apices demonstrates centrilobular emphysematous change. Other: Regional soft tissues appear normal. Calcified atherosclerotic plaque within the bilateral carotid bulbs. Normal noncontrast appearance of the thyroid gland. No bulky cervical lymphadenopathy on this noncontrast examination. IMPRESSION: 1. Similar findings of atrophy and microvascular ischemic disease without superimposed acute intracranial process. 2. No acute fracture or static subluxation of the cervical spine. 3. Old T1 spinous process fracture. Electronically Signed   By: Sandi Mariscal M.D.   On: 09/17/2017 13:25   Ct Angio Chest Pe W And/or Wo Contrast  Result Date: 10/11/2017 CLINICAL DATA:  Cough and shortness of breath EXAM: CT ANGIOGRAPHY CHEST WITH CONTRAST TECHNIQUE: Multidetector CT imaging of the chest was performed using the standard protocol during bolus administration of intravenous contrast. Multiplanar CT image reconstructions and MIPs  were obtained to evaluate the vascular  anatomy. CONTRAST:  136mL ISOVUE-370 IOPAMIDOL (ISOVUE-370) INJECTION 76% COMPARISON:  CTA chest 04/06/2017 FINDINGS: Cardiovascular: --Pulmonary arteries: Contrast injection is sufficient to demonstrate satisfactory opacification of the pulmonary arteries to the segmental level.There is a saddle pulmonary embolus that extends along both the right and left pulmonary arteries and into the right upper lobar, inter lobar and left lower lobar arteries. Emboli extend into proximal segmental branches of all right lobes and the left lower lobe. The main pulmonary artery is within normal limits for size. No evidence of right heart strain. --Aorta: Limited opacification of the aorta due to bolus timing optimization for the pulmonary arteries. Conventional 3 vessel aortic branching pattern. The aortic course and caliber are normal. There is mild aortic atherosclerosis. --Heart: Normal size. No pericardial effusion. Mediastinum/Nodes: No mediastinal, hilar or axillary lymphadenopathy. The visualized thyroid and thoracic esophageal course are unremarkable. Lungs/Pleura: There is bibasilar atelectasis. No pleural effusion or pneumothorax. No evidence of acute pulmonary infarction. Upper Abdomen: Contrast bolus timing is not optimized for evaluation of the abdominal organs. Within this limitation, the visualized organs of the upper abdomen are normal. Musculoskeletal: There is extravasated contrast in the visualized right arm extending into the right axilla. Review of the MIP images confirms the above findings. IMPRESSION: 1. Acute saddle pulmonary embolus with emboli extending into the left lower lobe are artery and multiple left lower lobe segmental branches and into all right-sided lobar branches and multiple proximal right segmental branches. 2. No CT evidence of right heart strain. 3. Extravasated contrast material within the right arm and axilla. Critical Value/emergent results were called by telephone at the time of  interpretation on 10/11/2017 at 7:42 pm to Dr. Tomi Bamberger, who verbally acknowledged these results. Aortic Atherosclerosis (ICD10-I70.0). Electronically Signed   By: Ulyses Jarred M.D.   On: 10/11/2017 19:43   Ct Cervical Spine Wo Contrast  Result Date: 09/17/2017 CLINICAL DATA:  Patient fell last evening, now with lethargy. History of stroke. EXAM: CT HEAD WITHOUT CONTRAST CT CERVICAL SPINE WITHOUT CONTRAST TECHNIQUE: Multidetector CT imaging of the head and cervical spine was performed following the standard protocol without intravenous contrast. Multiplanar CT image reconstructions of the cervical spine were also generated. COMPARISON:  Head CT - 09/03/2017; head and C-spine CT-06/16/2017 FINDINGS: CT HEAD FINDINGS Brain: Moderately advanced atrophy with sulcal prominence and centralized volume loss with commensurate ex vacuo dilatation the ventricular system. Periventricular hypodensities compatible microvascular ischemic disease. Given background parenchymal abnormalities, there is no CT evidence superimposed acute large territory infarct. No intraparenchymal or extra-axial mass or hemorrhage. Unchanged size and configuration of the ventricles and the basilar cisterns. No midline shift. Vascular: Intracranial atherosclerosis. Skull: No displaced calvarial fracture. Sinuses/Orbits: Limited visualization of the paranasal sinuses and mastoid air cells is normal. No air-fluid levels. A small amount of debris is seen within the bilateral external auditory canals. Post left-sided cataract surgery. Other: Regional soft tissues appear normal. CT CERVICAL SPINE FINDINGS Alignment: C1 to the superior endplate of T2 is imaged. Mild scoliotic curvature of the cervicothoracic spine, potentially positional. Otherwise, normal alignment of the cervical spine. No anterolisthesis or retrolisthesis. Bilateral facets are normally aligned. Skull base and vertebrae: The dens is normally positioned between the lateral masses of C1. Mild  degenerative change of the atlantodental articulation. Normal atlantoaxial articulations. Cervical vertebral body heights are preserved. Old fracture involving the T1 spinous process (sagittal image 18, series 10) Soft tissues and spinal canal: Prevertebral soft tissues are normal. Disc levels: Cervical intervertebral disc  space heights appear preserved. Upper chest: Limited visualization of lung apices demonstrates centrilobular emphysematous change. Other: Regional soft tissues appear normal. Calcified atherosclerotic plaque within the bilateral carotid bulbs. Normal noncontrast appearance of the thyroid gland. No bulky cervical lymphadenopathy on this noncontrast examination. IMPRESSION: 1. Similar findings of atrophy and microvascular ischemic disease without superimposed acute intracranial process. 2. No acute fracture or static subluxation of the cervical spine. 3. Old T1 spinous process fracture. Electronically Signed   By: Sandi Mariscal M.D.   On: 09/17/2017 13:25   Dg Chest Portable 1 View  Result Date: 10/11/2017 CLINICAL DATA:  67 year old male with hypotension. Bilateral lower extremity edema. EXAM: PORTABLE CHEST 1 VIEW COMPARISON:  09/17/2017 and earlier chest radiographs. FINDINGS: Portable AP semi upright view at 1436 hours. The patient is more rotated to the right. Mildly improved lung volumes and bibasilar ventilation. No pulmonary edema, pneumothorax or consolidation. No definite pleural effusion. Stable cardiac size and mediastinal contours. Cardiac size within normal limits. Visualized tracheal air column is within normal limits. Negative visible bowel gas pattern. No acute osseous abnormality identified. IMPRESSION: No acute cardiopulmonary abnormality. Electronically Signed   By: Genevie Ann M.D.   On: 10/11/2017 14:49   Dg Chest Portable 1 View  Result Date: 09/17/2017 CLINICAL DATA:  Hypoxemia; Per ED notes, last night around 9-10p pt fell and denies LOC; Hx CVA with R sided deficits; pt  lethargic and weak; Hx COPD, sarcoidosis, DM, and HTN. Smoker EXAM: PORTABLE CHEST 1 VIEW COMPARISON:  09/03/2017 FINDINGS: Shallow lung inflation. Heart size is accentuated by portable AP technique. There is stable minimal atelectasis or scarring at the RIGHT lung base. No pulmonary edema. Degenerative changes are seen in thoracic spine. No acute fracture. IMPRESSION: RIGHT LOWER lobe atelectasis. Electronically Signed   By: Nolon Nations M.D.   On: 09/17/2017 12:53     Phillips Climes M.D on 10/12/2017 at 3:06 PM  Between 7am to 7pm - Pager - [phone]  After 7pm go to [email or web address] - password [facility]  [facility] -  Office  [phone]

## 2017-10-12 NOTE — Progress Notes (Signed)
Patrick Holt for IV Heparin  Indication: pulmonary embolus  Patient Measurements: Height: 5\' 7"  (170.2 cm) Weight: 214 lb 4.6 oz (97.2 kg) IBW/kg (Calculated) : 66.1 Heparin Dosing Weight: 78.6 kg  Vital Signs: Temp: 98.1 F (36.7 C) (08/27 2135) Temp Source: Oral (08/27 2135) BP: 154/93 (08/27 2135) Pulse Rate: 63 (08/27 2135)  Labs: Recent Labs    10/11/17 1454 10/12/17 0225  HGB 12.6* 11.9*  HCT 41.5 38.4*  PLT 200 195  HEPARINUNFRC  --  0.51  CREATININE 1.30*  --     Estimated Creatinine Clearance: 60.4 mL/min (A) (by C-G formula based on SCr of 1.3 mg/dL (H)).   Medical History: Past Medical History:  Diagnosis Date  . Anemia   . Anxiety   . Asthma   . Atrial fibrillation ([place]) 01/2012   with RVR  . Bipolar 1 disorder ([place])   . Cancer ([place])   . Collagen vascular disease ([place])   . Conversion disorder 06/2010  . COPD (chronic obstructive pulmonary disease) ([place])   . Dermatomyositis ([place])   . Dermatomyositis ([place])   . Diverticulitis   . Diverticulosis of colon   . Dysrhythmia    "irregular" (11/15/2012)  . Esophageal dysmotility   . Esophageal stricture   . Fibromyalgia   . Gastritis   . GERD (gastroesophageal reflux disease)   . Hand fracture, right    sept 13, 2017, still wearing splint as og 01-10-2017  . Headache(784.0)    "severe; get them often" (11/15/2012)  . Hiatal hernia   . History of narcotic addiction ([place])   . Hx of adenomatous colonic polyps   . Hyperlipidemia   . Hypertension   . Internal hemorrhoids   . Ischemic heart disease   . Major depression    with acute psychotic break in 06/2010  . Myocardial infarct ([place])    mulitple (1999, 2000, 2004)  . Narcotic dependence ([place])   . Nephrolithiasis   . Obesity   . OCD (obsessive compulsive disorder)   . Otosclerosis   . Paroxysmal A-fib ([place])   . Peripheral neuropathy   . Raynaud's disease   . Renal insufficiency   . 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 Sarcoidosis   . Seizures ([place])   . Subarachnoid hemorrhage ([place]) 01/2012   with subdural  hematoma.   . Type II diabetes mellitus ([place])   . Urge incontinence   . Vertigo     Medications:  Scheduled:  . iopamidol      . iopamidol      . iopamidol      . nicotine  21 mg Transdermal Daily   Infusions:  . heparin 1,300 Units/hr (10/11/17 2045)  . sodium chloride      Assessment: [name] presents with shortness of breath, cough, leg pain and leg swelling starting 3 days ago. PMH includes asthma, COPD, CAD, heart failure, and DVT. Chest CT shows evidence of pulmonary embolus with emboli extending to lower lobe. Pharmacy consulted to dose heparin for PE treatment.   8/28 AM update: initial heparin level is therapeutic    Goal of Therapy:  Heparin level 0.3-0.7 units/ml Monitor platelets by anticoagulation protocol: Yes   Plan:  Cont heparin at 1300 units/hr 1200 heparin level  Narda Bonds, PharmD, BCPS Clinical Pharmacist Phone: [phone]

## 2017-10-12 NOTE — Progress Notes (Signed)
Patrick Holt for IV Heparin  Indication: pulmonary embolus  Patient Measurements: Height: 5\' 7"  (170.2 cm) Weight: 214 lb 4.6 oz (97.2 kg) IBW/kg (Calculated) : 66.1 Heparin Dosing Weight: 78.6 kg  Vital Signs: Temp: 98 F (36.7 C) (08/28 0737) Temp Source: Oral (08/28 0737) BP: 94/54 (08/28 0737) Pulse Rate: 64 (08/28 0737)  Labs: Recent Labs    10/11/17 1454 10/12/17 0225 10/12/17 0521 10/12/17 0834 10/12/17 1146  HGB 12.6* 11.9* 11.9*  --   --   HCT 41.5 38.4* 38.9*  --   --   PLT 200 195 178  --   --   HEPARINUNFRC  --  0.51  --   --  0.30  CREATININE 1.30*  --  1.61*  --   --   TROPONINI  --   --  <0.03 <0.03  --     Estimated Creatinine Clearance: 48.8 mL/min (A) (by C-G formula based on SCr of 1.61 mg/dL (H)).   Medical History: Past Medical History:  Diagnosis Date  . Anemia   . Anxiety   . Asthma   . Atrial fibrillation ([place]) 01/2012   with RVR  . Bipolar 1 disorder ([place])   . Cancer ([place])   . Collagen vascular disease ([place])   . Conversion disorder 06/2010  . COPD (chronic obstructive pulmonary disease) ([place])   . Dermatomyositis ([place])   . Dermatomyositis ([place])   . Diverticulitis   . Diverticulosis of colon   . Dysrhythmia    "irregular" (11/15/2012)  . Esophageal dysmotility   . Esophageal stricture   . Fibromyalgia   . Gastritis   . GERD (gastroesophageal reflux disease)   . Hand fracture, right    sept 13, 2017, still wearing splint as og 01-10-2017  . Headache(784.0)    "severe; get them often" (11/15/2012)  . Hiatal hernia   . History of narcotic addiction ([place])   . Hx of adenomatous colonic polyps   . Hyperlipidemia   . Hypertension   . Internal hemorrhoids   . Ischemic heart disease   . Major depression    with acute psychotic break in 06/2010  . Myocardial infarct ([place])    mulitple (1999, 2000, 2004)  . Narcotic dependence ([place])   . Nephrolithiasis   . Obesity   . OCD (obsessive compulsive  disorder)   . Otosclerosis   . Paroxysmal A-fib ([place])   . Peripheral neuropathy   . Raynaud's disease   . Renal insufficiency   . Rheumatoid arthritis(714.0)   . Sarcoidosis   . Seizures ([place])   . Subarachnoid hemorrhage ([place]) 01/2012   with subdural  hematoma.   . Type II diabetes mellitus ([place])   . Urge incontinence   . Vertigo     Medications:  Scheduled:  . ALPRAZolam  0.5 mg Oral TID  . amiodarone  200 mg Oral Daily  . atorvastatin  40 mg Oral Daily  . divalproex  250 mg Oral TID  . levETIRAcetam  500 mg Oral BID  . mometasone-formoterol  2 puff Inhalation BID  . multivitamin with minerals  1 tablet Oral Daily  . nicotine  21 mg Transdermal Daily  . oxybutynin  5 mg Oral TID  . pantoprazole  40 mg Oral Daily  . potassium chloride  20 mEq Oral QODAY  . risperiDONE  1 mg Oral BID  . sodium chloride flush  3 mL Intravenous Q12H  . [START ON 10/14/2017] Vitamin D (Ergocalciferol)  50,000 Units Oral Weekly  Infusions:  . sodium chloride    . heparin 1,300 Units/hr (10/11/17 2045)  . sodium chloride      Assessment: [name] presents with shortness of breath, cough, leg pain and leg swelling starting 3 days ago. PMH includes asthma, COPD, CAD, heart failure, and DVT. Chest CT shows evidence of pulmonary embolus with emboli extending to lower lobe. Pharmacy consulted to dose heparin for PE treatment.   Heparin level at 1145 is therapeutic at 0.3, but on lowest end of range. Will increase   Goal of Therapy:  Heparin level 0.3-0.7 units/ml Monitor platelets by anticoagulation protocol: Yes   Plan:  Increase heparin to 1350 units/hr Daily Heparin level Monitor for s/sx of bleeding  [name], PharmD, [place] Pager: [phone] Please utilize Amion for appropriate phone number to reach the unit pharmacist ([place])

## 2017-10-12 NOTE — Clinical Social Work Note (Signed)
Patient aide, [name] with [facility] services informed CSW that patient needs a hospital bed at home. Ms. [name] informed that this information will be communicated with nurse case manager.   Jiro Kiester Givens, MSW, LCSW Licensed Clinical Social Worker [place] [phone]

## 2017-10-13 ENCOUNTER — Inpatient Hospital Stay (HOSPITAL_COMMUNITY): Payer: Medicare Other

## 2017-10-13 DIAGNOSIS — I361 Nonrheumatic tricuspid (valve) insufficiency: Secondary | ICD-10-CM

## 2017-10-13 LAB — CBC
HEMATOCRIT: 37.8 % — AB (ref 39.0–52.0)
Hemoglobin: 11.7 g/dL — ABNORMAL LOW (ref 13.0–17.0)
MCH: 28.1 pg (ref 26.0–34.0)
MCHC: 31 g/dL (ref 30.0–36.0)
MCV: 90.9 fL (ref 78.0–100.0)
Platelets: 182 10*3/uL (ref 150–400)
RBC: 4.16 MIL/uL — AB (ref 4.22–5.81)
RDW: 21 % — ABNORMAL HIGH (ref 11.5–15.5)
WBC: 5.9 10*3/uL (ref 4.0–10.5)

## 2017-10-13 LAB — URINE CULTURE

## 2017-10-13 LAB — BASIC METABOLIC PANEL
ANION GAP: 5 (ref 5–15)
BUN: 8 mg/dL (ref 8–23)
CHLORIDE: 109 mmol/L (ref 98–111)
CO2: 29 mmol/L (ref 22–32)
Calcium: 8.4 mg/dL — ABNORMAL LOW (ref 8.9–10.3)
Creatinine, Ser: 1.06 mg/dL (ref 0.61–1.24)
GFR calc Af Amer: >60 mL/min (ref >60)
GFR calc non Af Amer: >60 mL/min (ref >60)
GLUCOSE: 119 mg/dL — AB (ref 70–99)
Potassium: 3.9 mmol/L (ref 3.5–5.1)
Sodium: 143 mmol/L (ref 135–145)

## 2017-10-13 LAB — ECHOCARDIOGRAM COMPLETE
Height: 67 in
Weight: 3428.59 oz

## 2017-10-13 LAB — HEPARIN LEVEL (UNFRACTIONATED): Heparin Unfractionated: 0.4 IU/mL (ref 0.30–0.70)

## 2017-10-13 MED ORDER — POLYVINYL ALCOHOL 1.4 % OP SOLN
2.0000 [drp] | Freq: Four times a day (QID) | OPHTHALMIC | Status: DC | PRN
  Administered 2017-10-13 – 2017-10-17 (×4): 2 [drp] via OPHTHALMIC
  Filled 2017-10-13: qty 15

## 2017-10-13 MED ORDER — HYPROMELLOSE (GONIOSCOPIC) 2.5 % OP SOLN: 2.0000 [drp] | Freq: Four times a day (QID) | OPHTHALMIC | Status: DC | PRN

## 2017-10-13 NOTE — Progress Notes (Signed)
[place] for IV Heparin  Indication: pulmonary embolus  Patient Measurements: Height: 5\' 7"  (170.2 cm) Weight: 214 lb 4.6 oz (97.2 kg) IBW/kg (Calculated) : 66.1 Heparin Dosing Weight: 78.6 kg  Vital Signs: Temp: 97.9 F (36.6 C) (08/29 0729) Temp Source: Oral (08/29 0729) BP: 103/64 (08/29 0729) Pulse Rate: 71 (08/29 0729)  Labs: Recent Labs    10/11/17 1454 10/12/17 0225 10/12/17 0521 10/12/17 0834 10/12/17 1146 10/12/17 1518 10/13/17 0424  HGB 12.6* 11.9* 11.9*  --   --   --  11.7*  HCT 41.5 38.4* 38.9*  --   --   --  37.8*  PLT 200 195 178  --   --   --  182  HEPARINUNFRC  --  0.51  --   --  0.30  --  0.40  CREATININE 1.30*  --  1.61*  --   --   --  1.06  TROPONINI  --   --  <0.03 <0.03  --  <0.03  --     Estimated Creatinine Clearance: 74.1 mL/min (by C-G formula based on SCr of 1.06 mg/dL).   Medical History: Past Medical History:  Diagnosis Date  . Anemia   . Anxiety   . Asthma   . Atrial fibrillation ([place]) 01/2012   with RVR  . Bipolar 1 disorder ([place])   . Cancer ([place])   . Collagen vascular disease ([place])   . Conversion disorder 06/2010  . COPD (chronic obstructive pulmonary disease) ([place])   . Dermatomyositis ([place])   . Dermatomyositis ([place])   . Diverticulitis   . Diverticulosis of colon   . Dysrhythmia    "irregular" (11/15/2012)  . Esophageal dysmotility   . Esophageal stricture   . Fibromyalgia   . Gastritis   . GERD (gastroesophageal reflux disease)   . Hand fracture, right    sept 13, 2017, still wearing splint as og 01-10-2017  . Headache(784.0)    "severe; get them often" (11/15/2012)  . Hiatal hernia   . History of narcotic addiction ([place])   . Hx of adenomatous colonic polyps   . Hyperlipidemia   . Hypertension   . Internal hemorrhoids   . Ischemic heart disease   . Major depression    with acute psychotic break in 06/2010  . Myocardial infarct ([place])    mulitple (1999, 2000, 2004)  .  Narcotic dependence ([place])   . Nephrolithiasis   . Obesity   . OCD (obsessive compulsive disorder)   . Otosclerosis   . Paroxysmal A-fib ([place])   . Peripheral neuropathy   . Raynaud's disease   . Renal insufficiency   . Rheumatoid arthritis(714.0)   . Sarcoidosis   . Seizures ([place])   . Subarachnoid hemorrhage ([place]) 01/2012   with subdural  hematoma.   . Type II diabetes mellitus ([place])   . Urge incontinence   . Vertigo     Medications:  Scheduled:  . ALPRAZolam  0.5 mg Oral TID  . amiodarone  200 mg Oral Daily  . atorvastatin  40 mg Oral Daily  . divalproex  250 mg Oral TID  . guaiFENesin  1,200 mg Oral BID  . levETIRAcetam  500 mg Oral BID  . mometasone-formoterol  2 puff Inhalation BID  . multivitamin with minerals  1 tablet Oral Daily  . nicotine  21 mg Transdermal Daily  . oxybutynin  5 mg Oral TID  . pantoprazole  40 mg Oral Daily  . potassium chloride  20  mEq Oral QODAY  . risperiDONE  1 mg Oral BID  . sodium chloride flush  3 mL Intravenous Q12H  . [START ON 10/14/2017] Vitamin D (Ergocalciferol)  50,000 Units Oral Weekly   Infusions:  . sodium chloride    . heparin 1,350 Units/hr (10/13/17 0600)  . sodium chloride      Assessment: [name] presents with shortness of breath, cough, leg pain and leg swelling starting 3 days ago. PMH includes asthma, COPD, CAD, heart failure, and DVT. Chest CT shows evidence of pulmonary embolus with emboli extending to lower lobe. Pharmacy consulted to dose heparin for PE treatment.   Heparin level this AM is therapeutic at 0.4. Hgb stable. Scr decreased to 1.06. F/u change to DOAC therapy   Goal of Therapy:  Heparin level 0.3-0.7 units/ml Monitor platelets by anticoagulation protocol: Yes   Plan:  Continue heparin at 1350 units/hr Daily Heparin level Monitor for s/sx of bleeding  Carrson Lightcap A. Levada Dy, PharmD, [place] Pager: [phone] Please utilize Am[facility] for appropriate phone number to reach the  unit pharmacist ([place])

## 2017-10-13 NOTE — Evaluation (Signed)
Physical Therapy Evaluation Patient Details Name: Patrick Holt MRN: [id number] DOB: 17-Jul-1950 Today's Date: 10/13/2017   History of Present Illness  Pt is a 67 y.o. M with significant PMH of multiple comorbidities including atrial fibrillation, diastolic dysfunction CHF, COPD, conversion disorder, asthma, right hear failure, chronic lower extremitys tasis dermatitis who presented to ER with generalized weakness, shortness of breath, cough, work-up significant for acute PE  Clinical Impression  Patient with frequent readmissions noted to hospital, history of extensive falls, and decreased caregiver support. His aide is only available 3 days a week. He reports he has been restricted to couch and unable to move without assistance. Currently, requiring up to two person moderate assistance for transfers using walker. SpO2 88-91% on RA. Patient presenting as a high fall risk based on history of falls, balance deficits, and debility/deconditioning. Highly recommending d/c to SNF to maximize functional independence. Will follow acutely.    Follow Up Recommendations SNF;Supervision/Assistance - 24 hour;Supervision for mobility/OOB    Equipment Recommendations  Hospital bed    Recommendations for Other Services OT consult     Precautions / Restrictions Precautions Precautions: Fall Restrictions Weight Bearing Restrictions: No      Mobility  Bed Mobility Overal bed mobility: Needs Assistance Bed Mobility: Supine to Sit     Supine to sit: Mod assist     General bed mobility comments: mod assist to advance RLE and raise trunk  Transfers Overall transfer level: Needs assistance Equipment used: Rolling walker (2 wheeled) Transfers: Sit to/from [facility] Sit to Stand: Mod assist;+2 physical assistance Stand pivot transfers: Min assist;+2 safety/equipment       General transfer comment: pt needing extensive directional vc's and for movement initiation in order to  transfer from bed to chair  Ambulation/Gait                Stairs            Wheelchair Mobility    Modified Rankin (Stroke Patients Only)       Balance Overall balance assessment: Needs assistance;History of Falls Sitting-balance support: Feet supported Sitting balance-Leahy Scale: Fair     Standing balance support: Bilateral upper extremity supported Standing balance-Leahy Scale: Poor Standing balance comment: relies on BUE support                             Pertinent Vitals/Pain Pain Assessment: No/denies pain    Home Living Family/patient expects to be discharged to:: Private residence Living Arrangements: Alone Available Help at Discharge: Personal care attendant Type of Home: Apartment Home Access: Elevator     Home Layout: One level Home Equipment: Environmental consultant - 2 wheels;Cane - single point;Electric scooter Additional Comments: Ship broker    Prior Function Level of Independence: Secondary school teacher / Transfers Assistance Needed: amb short distances with RW and uses scooter for longer distances--was unable to get out of bed for 2 days prior to admission  ADL's / Homemaking Assistance Needed: aide assists with bathing/dressing; sponge bathes  Comments: aide comes 3x/week     Hand Dominance   Dominant Hand: Right    Extremity/Trunk Assessment   Upper Extremity Assessment Upper Extremity Assessment: Generalized weakness RUE Deficits / Details: tremors noted LUE Deficits / Details: tremors noted    Lower Extremity Assessment Lower Extremity Assessment: Generalized weakness       Communication   Communication: No difficulties  Cognition Arousal/Alertness: Awake/alert Behavior During Therapy: [facility] for  tasks assessed/performed Overall Cognitive Status: Within Functional Limits for tasks assessed                                        General Comments General comments (skin integrity,  edema, etc.): scaling and redness distal LE's    Exercises     Assessment/Plan    PT Assessment Patient needs continued PT services  PT Problem List Decreased activity tolerance;Decreased strength;Decreased balance;Decreased mobility;Pain;Decreased range of motion;Decreased coordination;Decreased knowledge of use of DME;Decreased safety awareness       PT Treatment Interventions DME instruction;Gait training;Therapeutic exercise;Therapeutic activities;Functional mobility training;Patient/family education;Balance training    PT Goals (Current goals can be found in the Care Plan section)  Acute Rehab PT Goals Patient Stated Goal: get a hospital bed PT Goal Formulation: With patient Time For Goal Achievement: 10/27/17 Potential to Achieve Goals: Fair    Frequency Min 2X/week   Barriers to discharge Decreased caregiver support      Co-evaluation               AM-PAC PT "6 Clicks" Daily Activity  Outcome Measure Difficulty turning over in bed (including adjusting bedclothes, sheets and blankets)?: A Lot Difficulty moving from lying on back to sitting on the side of the bed? : Unable Difficulty sitting down on and standing up from a chair with arms (e.g., wheelchair, bedside commode, etc,.)?: Unable Help needed moving to and from a bed to chair (including a wheelchair)?: A Lot Help needed walking in hospital room?: A Lot Help needed climbing 3-5 steps with a railing? : Total 6 Click Score: 9    End of Session Equipment Utilized During Treatment: Gait belt;Oxygen Activity Tolerance: Patient limited by fatigue Patient left: in chair;with call bell/phone within reach;with chair alarm set Nurse Communication: Mobility status PT Visit Diagnosis: Unsteadiness on feet (R26.81);Repeated falls (R29.6);Other abnormalities of gait and mobility (R26.89);History of falling (Z91.81);Muscle weakness (generalized) (M62.81);Difficulty in walking, not elsewhere classified (R26.2);Other  symptoms and signs involving the nervous system (R29.898)    Time: 2957-4734 PT Time Calculation (min) (ACUTE ONLY): 21 min   Charges:   PT Evaluation $PT Eval Moderate Complexity: 1 Mod          Ellamae Sia, PT, DPT Acute Rehabilitation Services  Pager: [place] 10/13/2017, 5:27 PM

## 2017-10-13 NOTE — Progress Notes (Signed)
PROGRESS NOTE                                                                                                                                                                                                             Patient Demographics:    Patrick Holt, is a 67 y.o. male, DOB - [birth year], ONG:[id number]  Admit date - 10/11/2017   Admitting Physician Elwyn Reach, MD  Outpatient Primary MD for the patient is [facility]  LOS - 2   Chief Complaint  Patient presents with  . Hypotension       Brief Narrative    Patrick Holt is a 67 y.o. male with medical history significant of multiple comorbidities including atrial fibrillation, diastolic dysfunction CHF, COPD, conversion disorder, asthma, right heart failure, chronic lower extremity stasis dermatitis who presented to the ER with generalized weakness, shortness of breath, cough as well as hypotension, work-up significant for acute PE   Subjective:    Patrick Holt today has, No headache, No chest pain, No abdominal pain , reports some cough, currently productive   Assessment  & Plan :    Principal Problem:   Pulmonary embolism ([place]) Active Problems:   DEPRESSION, MAJOR   Tobacco abuse   COPD (chronic obstructive pulmonary disease) (HCC)   GERD   History of seizure disorder   Essential hypertension, benign   Hypernatremia   PAF (paroxysmal atrial fibrillation) (HCC)   ARF (acute renal failure) ([place])   Anasarca   acute saddle pulmonary embolus: -as Evident on CTA chest, patient has sedentary lifestyle,  2D echo to evaluate for right heart strain is done, but readings are pending, blood pressure remains soft, still requiring oxygen, I will continue with heparin GTT today, hopefully we can transition to oral anticoagulation tomorrow if to the echo with no evidence of strain .  paroxysmal atrial fibrillation: -Currently on sinus rhythm, rate  controlled, on amiodarone, now started on anticoagulation for PE.  anasarca:  - Secondary to right heart failure.  Patient has also low albumin so could be contributing.    Renal function increased to 1.6 today, hold on diuresis  AKI on CKD -peaked at 1.6 , appears to be improving, avoid nephrotoxic medications.  COPD:  - No acute exacerbation.  Empiric nebulizer treatments.  tobacco abuse:  - Counseling provided.  Will offer nicotine patch.  GERD: - Continue with PPIs  seizure disorder:  - Continue with Keppra and Depakote.  essential hypertension:   - Continue with home regimen and monitor blood pressure.  Acute hypoxic respiratory failure -The setting of PE, continue with oxygen, wean as tolerated, he has cough with a productive sputum, encouraged to use incentive spirometry and flutter valve, started on Mucinex  Code Status : Heparin GTT  Family Communication  : none at bedside  Disposition Plan  : PT consult pending ,likley will need SNF placmant  Consults  :  None  Procedures  : None  DVT Prophylaxis  :  Heparin GTT  Lab Results  Component Value Date   PLT 182 10/13/2017    Antibiotics  :    Anti-infectives (From admission, onward)   None        Objective:   Vitals:   10/13/17 0443 10/13/17 0445 10/13/17 0729 10/13/17 0916  BP: 101/61  103/64   Pulse: 69  71 69  Resp: 16  20 20   Temp:  98.8 F (37.1 C) 97.9 F (36.6 C)   TempSrc:  Oral Oral   SpO2: 91%  90% 91%  Weight:      Height:        Wt Readings from Last 3 Encounters:  10/11/17 97.2 kg  09/07/17 105.7 kg  06/16/17 86.2 kg     Intake/Output Summary (Last 24 hours) at 10/13/2017 1448 Last data filed at 10/13/2017 1300 Gross per 24 hour  Intake 2266.06 ml  Output 700 ml  Net 1566.06 ml     Physical Exam  Awake Alert, Oriented X 3, No new F.N deficits, Normal affect Symmetrical Chest wall movement, Good air movement bilaterally, CTAB RRR,No Gallops,Rubs or new Murmurs,  No Parasternal Heave +ve B.Sounds, Abd Soft, No tenderness, No rebound - guarding or rigidity. Bilateral lower extremity edema, right > left    Data Review:    CBC Recent Labs  Lab 10/11/17 1454 10/12/17 0225 10/12/17 0521 10/13/17 0424  WBC 6.7 5.5 6.0 5.9  HGB 12.6* 11.9* 11.9* 11.7*  HCT 41.5 38.4* 38.9* 37.8*  PLT 200 195 178 182  MCV 92.8 91.2 92.4 90.9  MCH 28.2 28.3 28.3 28.1  MCHC 30.4 31.0 30.6 31.0  RDW 21.5* 21.2* 21.2* 21.0*  LYMPHSABS 2.0  --   --   --   MONOABS 0.7  --   --   --   EOSABS 0.1  --   --   --   BASOSABS 0.0  --   --   --     Chemistries  Recent Labs  Lab 10/11/17 1454 10/12/17 0521 10/13/17 0424  NA 146* 144 143  K 3.9 3.9 3.9  CL 109 110 109  CO2 29 24 29   GLUCOSE 101* 76 119*  BUN 14 14 8   CREATININE 1.30* 1.61* 1.06  CALCIUM 9.0 8.5* 8.4*  MG 2.0  --   --   AST 11* 11*  --   ALT 11 10  --   ALKPHOS 65 56  --   BILITOT 1.1 0.7  --    ------------------------------------------------------------------------------------------------------------------ No results for input(s): CHOL, HDL, LDLCALC, TRIG, CHOLHDL, LDLDIRECT in the last 72 hours.  Lab Results  Component Value Date   HGBA1C 5.9 (H) 01/14/2017   ------------------------------------------------------------------------------------------------------------------ No results for input(s): TSH, T4TOTAL, T3FREE, THYROIDAB in the last 72 hours.  Invalid input(s): FREET3 ------------------------------------------------------------------------------------------------------------------ No results for input(s): VITAMINB12, FOLATE, FERRITIN, TIBC, IRON, RETICCTPCT in the last  72 hours.  Coagulation profile No results for input(s): INR, PROTIME in the last 168 hours.  No results for input(s): DDIMER in the last 72 hours.  Cardiac Enzymes Recent Labs  Lab 10/12/17 0521 10/12/17 0834 10/12/17 1518  TROPONINI <0.03 <0.03 <0.03    ------------------------------------------------------------------------------------------------------------------    Component Value Date/Time   BNP 20.8 10/11/2017 1454    Inpatient Medications  Scheduled Meds: . ALPRAZolam  0.5 mg Oral TID  . amiodarone  200 mg Oral Daily  . atorvastatin  40 mg Oral Daily  . divalproex  250 mg Oral TID  . guaiFENesin  1,200 mg Oral BID  . levETIRAcetam  500 mg Oral BID  . mometasone-formoterol  2 puff Inhalation BID  . multivitamin with minerals  1 tablet Oral Daily  . nicotine  21 mg Transdermal Daily  . oxybutynin  5 mg Oral TID  . pantoprazole  40 mg Oral Daily  . potassium chloride  20 mEq Oral QODAY  . risperiDONE  1 mg Oral BID  . sodium chloride flush  3 mL Intravenous Q12H  . [START ON 10/14/2017] Vitamin D (Ergocalciferol)  50,000 Units Oral Weekly   Continuous Infusions: . sodium chloride    . heparin 1,350 Units/hr (10/13/17 0944)  . sodium chloride     PRN Meds:.sodium chloride, hydroxypropyl methylcellulose / hypromellose, ipratropium-albuterol, loperamide, ondansetron **OR** ondansetron (ZOFRAN) IV, senna-docusate, sodium chloride flush  Micro Results Recent Results (from the past 240 hour(s))  Urine culture     Status: Abnormal   Collection Time: 10/11/17 10:38 PM  Result Value Ref Range Status   Specimen Description URINE, CATHETERIZED  Final   Special Requests NONE  Final   Culture (A)  Final    <10,000 COLONIES/mL INSIGNIFICANT GROWTH Performed at [facility], [address] [place], [place] [address]    Report Status 10/13/2017 FINAL  Final    Radiology Reports Ct Head Wo Contrast  Result Date: 09/17/2017 CLINICAL DATA:  Patient fell last evening, now with lethargy. History of stroke. EXAM: CT HEAD WITHOUT CONTRAST CT CERVICAL SPINE WITHOUT CONTRAST TECHNIQUE: Multidetector CT imaging of the head and cervical spine was performed following the standard protocol without intravenous contrast. Multiplanar  CT image reconstructions of the cervical spine were also generated. COMPARISON:  Head CT - 09/03/2017; head and C-spine CT-06/16/2017 FINDINGS: CT HEAD FINDINGS Brain: Moderately advanced atrophy with sulcal prominence and centralized volume loss with commensurate ex vacuo dilatation the ventricular system. Periventricular hypodensities compatible microvascular ischemic disease. Given background parenchymal abnormalities, there is no CT evidence superimposed acute large territory infarct. No intraparenchymal or extra-axial mass or hemorrhage. Unchanged size and configuration of the ventricles and the basilar cisterns. No midline shift. Vascular: Intracranial atherosclerosis. Skull: No displaced calvarial fracture. Sinuses/Orbits: Limited visualization of the paranasal sinuses and mastoid air cells is normal. No air-fluid levels. A small amount of debris is seen within the bilateral external auditory canals. Post left-sided cataract surgery. Other: Regional soft tissues appear normal. CT CERVICAL SPINE FINDINGS Alignment: C1 to the superior endplate of T2 is imaged. Mild scoliotic curvature of the cervicothoracic spine, potentially positional. Otherwise, normal alignment of the cervical spine. No anterolisthesis or retrolisthesis. Bilateral facets are normally aligned. Skull base and vertebrae: The dens is normally positioned between the lateral masses of C1. Mild degenerative change of the atlantodental articulation. Normal atlantoaxial articulations. Cervical vertebral body heights are preserved. Old fracture involving the T1 spinous process (sagittal image 18, series 10) Soft tissues and spinal canal: Prevertebral soft tissues  are normal. Disc levels: Cervical intervertebral disc space heights appear preserved. Upper chest: Limited visualization of lung apices demonstrates centrilobular emphysematous change. Other: Regional soft tissues appear normal. Calcified atherosclerotic plaque within the bilateral carotid  bulbs. Normal noncontrast appearance of the thyroid gland. No bulky cervical lymphadenopathy on this noncontrast examination. IMPRESSION: 1. Similar findings of atrophy and microvascular ischemic disease without superimposed acute intracranial process. 2. No acute fracture or static subluxation of the cervical spine. 3. Old T1 spinous process fracture. Electronically Signed   By: Sandi Mariscal M.D.   On: 09/17/2017 13:25   Ct Angio Chest Pe W And/or Wo Contrast  Result Date: 10/11/2017 CLINICAL DATA:  Cough and shortness of breath EXAM: CT ANGIOGRAPHY CHEST WITH CONTRAST TECHNIQUE: Multidetector CT imaging of the chest was performed using the standard protocol during bolus administration of intravenous contrast. Multiplanar CT image reconstructions and MIPs were obtained to evaluate the vascular anatomy. CONTRAST:  173mL ISOVUE-370 IOPAMIDOL (ISOVUE-370) INJECTION 76% COMPARISON:  CTA chest 04/06/2017 FINDINGS: Cardiovascular: --Pulmonary arteries: Contrast injection is sufficient to demonstrate satisfactory opacification of the pulmonary arteries to the segmental level.There is a saddle pulmonary embolus that extends along both the right and left pulmonary arteries and into the right upper lobar, inter lobar and left lower lobar arteries. Emboli extend into proximal segmental branches of all right lobes and the left lower lobe. The main pulmonary artery is within normal limits for size. No evidence of right heart strain. --Aorta: Limited opacification of the aorta due to bolus timing optimization for the pulmonary arteries. Conventional 3 vessel aortic branching pattern. The aortic course and caliber are normal. There is mild aortic atherosclerosis. --Heart: Normal size. No pericardial effusion. Mediastinum/Nodes: No mediastinal, hilar or axillary lymphadenopathy. The visualized thyroid and thoracic esophageal course are unremarkable. Lungs/Pleura: There is bibasilar atelectasis. No pleural effusion or  pneumothorax. No evidence of acute pulmonary infarction. Upper Abdomen: Contrast bolus timing is not optimized for evaluation of the abdominal organs. Within this limitation, the visualized organs of the upper abdomen are normal. Musculoskeletal: There is extravasated contrast in the visualized right arm extending into the right axilla. Review of the MIP images confirms the above findings. IMPRESSION: 1. Acute saddle pulmonary embolus with emboli extending into the left lower lobe are artery and multiple left lower lobe segmental branches and into all right-sided lobar branches and multiple proximal right segmental branches. 2. No CT evidence of right heart strain. 3. Extravasated contrast material within the right arm and axilla. Critical Value/emergent results were called by telephone at the time of interpretation on 10/11/2017 at 7:42 pm to Dr. Tomi Bamberger, who verbally acknowledged these results. Aortic Atherosclerosis (ICD10-I70.0). Electronically Signed   By: Ulyses Jarred M.D.   On: 10/11/2017 19:43   Ct Cervical Spine Wo Contrast  Result Date: 09/17/2017 CLINICAL DATA:  Patient fell last evening, now with lethargy. History of stroke. EXAM: CT HEAD WITHOUT CONTRAST CT CERVICAL SPINE WITHOUT CONTRAST TECHNIQUE: Multidetector CT imaging of the head and cervical spine was performed following the standard protocol without intravenous contrast. Multiplanar CT image reconstructions of the cervical spine were also generated. COMPARISON:  Head CT - 09/03/2017; head and C-spine CT-06/16/2017 FINDINGS: CT HEAD FINDINGS Brain: Moderately advanced atrophy with sulcal prominence and centralized volume loss with commensurate ex vacuo dilatation the ventricular system. Periventricular hypodensities compatible microvascular ischemic disease. Given background parenchymal abnormalities, there is no CT evidence superimposed acute large territory infarct. No intraparenchymal or extra-axial mass or hemorrhage. Unchanged size and  configuration of the ventricles and  the basilar cisterns. No midline shift. Vascular: Intracranial atherosclerosis. Skull: No displaced calvarial fracture. Sinuses/Orbits: Limited visualization of the paranasal sinuses and mastoid air cells is normal. No air-fluid levels. A small amount of debris is seen within the bilateral external auditory canals. Post left-sided cataract surgery. Other: Regional soft tissues appear normal. CT CERVICAL SPINE FINDINGS Alignment: C1 to the superior endplate of T2 is imaged. Mild scoliotic curvature of the cervicothoracic spine, potentially positional. Otherwise, normal alignment of the cervical spine. No anterolisthesis or retrolisthesis. Bilateral facets are normally aligned. Skull base and vertebrae: The dens is normally positioned between the lateral masses of C1. Mild degenerative change of the atlantodental articulation. Normal atlantoaxial articulations. Cervical vertebral body heights are preserved. Old fracture involving the T1 spinous process (sagittal image 18, series 10) Soft tissues and spinal canal: Prevertebral soft tissues are normal. Disc levels: Cervical intervertebral disc space heights appear preserved. Upper chest: Limited visualization of lung apices demonstrates centrilobular emphysematous change. Other: Regional soft tissues appear normal. Calcified atherosclerotic plaque within the bilateral carotid bulbs. Normal noncontrast appearance of the thyroid gland. No bulky cervical lymphadenopathy on this noncontrast examination. IMPRESSION: 1. Similar findings of atrophy and microvascular ischemic disease without superimposed acute intracranial process. 2. No acute fracture or static subluxation of the cervical spine. 3. Old T1 spinous process fracture. Electronically Signed   By: Sandi Mariscal M.D.   On: 09/17/2017 13:25   Dg Chest Portable 1 View  Result Date: 10/11/2017 CLINICAL DATA:  67 year old male with hypotension. Bilateral lower extremity edema. EXAM:  PORTABLE CHEST 1 VIEW COMPARISON:  09/17/2017 and earlier chest radiographs. FINDINGS: Portable AP semi upright view at 1436 hours. The patient is more rotated to the right. Mildly improved lung volumes and bibasilar ventilation. No pulmonary edema, pneumothorax or consolidation. No definite pleural effusion. Stable cardiac size and mediastinal contours. Cardiac size within normal limits. Visualized tracheal air column is within normal limits. Negative visible bowel gas pattern. No acute osseous abnormality identified. IMPRESSION: No acute cardiopulmonary abnormality. Electronically Signed   By: Genevie Ann M.D.   On: 10/11/2017 14:49   Dg Chest Portable 1 View  Result Date: 09/17/2017 CLINICAL DATA:  Hypoxemia; Per ED notes, last night around 9-10p pt fell and denies LOC; Hx CVA with R sided deficits; pt lethargic and weak; Hx COPD, sarcoidosis, DM, and HTN. Smoker EXAM: PORTABLE CHEST 1 VIEW COMPARISON:  09/03/2017 FINDINGS: Shallow lung inflation. Heart size is accentuated by portable AP technique. There is stable minimal atelectasis or scarring at the RIGHT lung base. No pulmonary edema. Degenerative changes are seen in thoracic spine. No acute fracture. IMPRESSION: RIGHT LOWER lobe atelectasis. Electronically Signed   By: Nolon Nations M.D.   On: 09/17/2017 12:53     Phillips Climes M.D on 10/13/2017 at 2:48 PM  Between 7am to 7pm - Pager - [phone]  After 7pm go to [email or web address] - password [email or web address]  [facility] -  Office  [phone]

## 2017-10-13 NOTE — Care Management Note (Addendum)
Case Management Note  Patient Details  Name: Patrick Holt MRN: [id number] Date of Birth: 1950/10/30  Subjective/Objective:       Pulmonary Embolism            Action/Plan: Noted patient admitted 3 times in 6 months, recent SNF admission; awaiting Physical Therapy eval for disposition needs. CM will continue to follow for progression of care. Benefit check in progress for NOAC.  Expected Discharge Date:    possibly 10/16/2017              Expected Discharge Plan:   Home vs SNF Status of Service:   In progress  Sherrilyn Rist [phone] 10/13/2017, 10:07 AM

## 2017-10-13 NOTE — Care Management (Signed)
Per Manus Gunning W/[employer]:  Co-pay amount for Xarelto 15 mg. once a day  is $45.00 for 30 day supply.at a local pharmacy. Mail order 90 day supply is $125.00.  Co-pay amount for Eliquis 5mg  twice a day $45.00 for 30 day supply at local pharmacy. Mail order 90 day supply $125.00  NO PA Required Pharmacies in net work are: [facility],  [facility].  Thank-you Dmiya Malphrus

## 2017-10-13 NOTE — Progress Notes (Signed)
2D Echocardiogram has been performed.  10/13/2017, 12:27 PM

## 2017-10-14 ENCOUNTER — Inpatient Hospital Stay (HOSPITAL_COMMUNITY): Payer: Medicare Other

## 2017-10-14 LAB — BASIC METABOLIC PANEL
Anion gap: 5 (ref 5–15)
BUN: 7 mg/dL — ABNORMAL LOW (ref 8–23)
CALCIUM: 8.5 mg/dL — AB (ref 8.9–10.3)
CO2: 29 mmol/L (ref 22–32)
CREATININE: 0.95 mg/dL (ref 0.61–1.24)
Chloride: 107 mmol/L (ref 98–111)
GFR calc non Af Amer: >60 mL/min (ref >60)
Glucose, Bld: 97 mg/dL (ref 70–99)
Potassium: 3.9 mmol/L (ref 3.5–5.1)
SODIUM: 141 mmol/L (ref 135–145)

## 2017-10-14 LAB — BLOOD GAS, ARTERIAL
Acid-Base Excess: 5.8 mmol/L — ABNORMAL HIGH (ref 0.0–2.0)
Bicarbonate: 30 mmol/L — ABNORMAL HIGH (ref 20.0–28.0)
Drawn by: 51831
O2 Content: 3 L/min
O2 Saturation: 86.2 %
Patient temperature: 98.6
pCO2 arterial: 45.4 mmHg (ref 32.0–48.0)
pH, Arterial: 7.436 (ref 7.350–7.450)
pO2, Arterial: 51.9 mmHg — ABNORMAL LOW (ref 83.0–108.0)

## 2017-10-14 LAB — CBC
HEMATOCRIT: 36.2 % — AB (ref 39.0–52.0)
Hemoglobin: 11.3 g/dL — ABNORMAL LOW (ref 13.0–17.0)
MCH: 28.2 pg (ref 26.0–34.0)
MCHC: 31.2 g/dL (ref 30.0–36.0)
MCV: 90.3 fL (ref 78.0–100.0)
Platelets: 175 10*3/uL (ref 150–400)
RBC: 4.01 MIL/uL — ABNORMAL LOW (ref 4.22–5.81)
RDW: 20.6 % — AB (ref 11.5–15.5)
WBC: 5.6 10*3/uL (ref 4.0–10.5)

## 2017-10-14 LAB — HEPARIN LEVEL (UNFRACTIONATED): HEPARIN UNFRACTIONATED: 0.31 [IU]/mL (ref 0.30–0.70)

## 2017-10-14 MED ORDER — APIXABAN 5 MG PO TABS
10.0000 mg | ORAL_TABLET | Freq: Two times a day (BID) | ORAL | Status: DC
  Administered 2017-10-14 – 2017-10-18 (×9): 10 mg via ORAL
  Filled 2017-10-14 (×9): qty 2

## 2017-10-14 MED ORDER — ALPRAZOLAM 0.5 MG PO TABS: 0.5000 mg | ORAL_TABLET | Freq: Three times a day (TID) | ORAL | 0 refills | Status: AC

## 2017-10-14 MED ORDER — NICOTINE 21 MG/24HR TD PT24: 21.0000 mg | MEDICATED_PATCH | Freq: Every day | TRANSDERMAL | 0 refills | Status: AC

## 2017-10-14 MED ORDER — APIXABAN 5 MG PO TABS: ORAL_TABLET | ORAL | Status: DC

## 2017-10-14 MED ORDER — GUAIFENESIN ER 600 MG PO TB12: 1200.0000 mg | ORAL_TABLET | Freq: Two times a day (BID) | ORAL | Status: DC

## 2017-10-14 MED ORDER — APIXABAN 5 MG PO TABS: 5.0000 mg | ORAL_TABLET | Freq: Two times a day (BID) | ORAL | Status: DC

## 2017-10-14 NOTE — Clinical Social Work Note (Signed)
Clinical Social Worker facilitated patient discharge including contacting patient family and facility to confirm patient discharge plans.  Clinical information faxed to facility and family agreeable with plan.  CSW arranged ambulance transport via [facility] to [place].  RN to call [phone] for report prior to discharge.  Clinical Social Worker will sign off for now as social work intervention is no longer needed. Please consult us again if new need arises.  Patrick Holt, LCSWA [phone]  

## 2017-10-14 NOTE — Progress Notes (Signed)
Report attempted to RN at [facility] for report. Will try again.

## 2017-10-14 NOTE — Clinical Social Work Note (Signed)
Clinical Social Work Assessment  Patient Details  Name: Patrick Holt MRN: [id number] Date of Birth: 05-02-1950  Date of referral:  10/14/17               Reason for consult:  Facility Placement                Permission sought to share information with:    Permission granted to share information::     Name::        Agency::     Relationship::     Contact Information:     Housing/Transportation Living arrangements for the past 2 months:  Apartment Source of Information:  Patient Patient Interpreter Needed:  None Criminal Activity/Legal Involvement Pertinent to Current Situation/Hospitalization:  No - Comment as needed Significant Relationships:  Friend Lives with:  Self Do you feel safe going back to the place where you live?  No Need for family participation in patient care:  No (Coment)  Care giving concerns:  Pt is alert and oriented.   Social Worker assessment / plan:  CSW spoke with pt at bedside. Pt is in copay days at [facility]. Pt went to [facility] in the past and is agreeable to return. CSW speaking with [name] to determine financial part.   Employment status:  Disabled (Comment on whether or not currently receiving Disability) Insurance information:  Managed Medicare PT Recommendations:  [place] / Referral to community resources:  [place]  Patient/Family's Response to care:  Pt verbalized understanding of CSW role and expressed appreciation for support. Pt denies any concern regarding pt care at this time.   Patient/Family's Understanding of and Emotional Response to Diagnosis, Current Treatment, and Prognosis:  Pt understanding and realistic regarding physical limitations. Pt understands the need for SNF placement at d/c. Pt agreeable to SNF placement at d/c, at this time. Pt's responses emotionally appropriate during conversation with CSW. Pt denies any concern regarding treatment plan at this time. CSW will continue  to provide support and facilitate d/c needs.    Emotional Assessment Appearance:  Appears stated age Attitude/Demeanor/Rapport:  (Patient was appropriate) Affect (typically observed):  Accepting, Appropriate, Calm Orientation:  Oriented to Self, Oriented to Place, Oriented to  Time, Oriented to Situation Alcohol / Substance use:  Not Applicable Psych involvement (Current and /or in the community):  No (Comment)  Discharge Needs  Concerns to be addressed:  Basic Needs, Care Coordination Readmission within the last 30 days:  Yes Current discharge risk:  Dependent with Mobility Barriers to Discharge:  [place], LCSW 10/14/2017, 12:56 PM

## 2017-10-14 NOTE — Progress Notes (Signed)
Patient's O2 sat on RA 68%,on 3L/[place], oxygen sat is 75%,oxygen increased to 6L/[place],sat slowly climbed up to 87-88%.RRT in the room and gave pt's dulera with no change.Oxygen up to 100% non rebreather mask,Oxygen sat 96-97%. Text paged MD on call and updated on patient's status,order received. Order for stat PCXR and stat ABG. Discharge cancelled. [facility] notified. [facility] staff made aware. Will continue to monitor patient. [name] Safley, Wonda Cheng, Therapist, sports

## 2017-10-14 NOTE — Discharge Summary (Signed)
Patrick Holt, is a 68 y.o. male  DOB 1950/08/16  MRN [id number].  Admission date:  10/11/2017  Admitting Physician  Elwyn Reach, MD  Discharge Date:  10/14/2017   Primary MD  Medicine, [facility]  Recommendations for primary care physician for things to follow:  - please  check CBC, BMP within 3 days -Patient is on 3 L nasal cannula, wean as tolerated, continue to encourage using incentive spirometry and flutter valve  Admission Diagnosis  Shortness of breath [R06.02] Cough [R05] Peripheral edema [R60.9] Acute saddle pulmonary embolism with acute cor pulmonale (HCC) [I26.02] Fatigue, unspecified type [R53.83]   Discharge Diagnosis  Shortness of breath [R06.02] Cough [R05] Peripheral edema [R60.9] Acute saddle pulmonary embolism with acute cor pulmonale (HCC) [I26.02] Fatigue, unspecified type [R53.83]    Principal Problem:   Pulmonary embolism (HCC) Active Problems:   DEPRESSION, MAJOR   Tobacco abuse   COPD (chronic obstructive pulmonary disease) (HCC)   GERD   History of seizure disorder   Essential hypertension, benign   Hypernatremia   PAF (paroxysmal atrial fibrillation) (HCC)   ARF (acute renal failure) ([place])   Anasarca      Past Medical History:  Diagnosis Date  . Anemia   . Anxiety   . Asthma   . Atrial fibrillation ([place]) 01/2012   with RVR  . Bipolar 1 disorder ([place])   . Cancer ([place])   . Collagen vascular disease ([place])   . Conversion disorder 06/2010  . COPD (chronic obstructive pulmonary disease) ([place])   . Dermatomyositis ([place])   . Dermatomyositis ([place])   . Diverticulitis   . Diverticulosis of colon   . Dysrhythmia    "irregular" (11/15/2012)  . Esophageal dysmotility   . Esophageal stricture   . Fibromyalgia   . Gastritis   . GERD (gastroesophageal reflux disease)   . Hand fracture, right    sept 13, 2017, still wearing splint as og  01-10-2017  . Headache(784.0)    "severe; get them often" (11/15/2012)  . Hiatal hernia   . History of narcotic addiction ([place])   . Hx of adenomatous colonic polyps   . Hyperlipidemia   . Hypertension   . Internal hemorrhoids   . Ischemic heart disease   . Major depression    with acute psychotic break in 06/2010  . Myocardial infarct ([place])    mulitple (1999, 2000, 2004)  . Narcotic dependence ([place])   . Nephrolithiasis   . Obesity   . OCD (obsessive compulsive disorder)   . Otosclerosis   . Paroxysmal A-fib ([place])   . Peripheral neuropathy   . Raynaud's disease   . Renal insufficiency   . Rheumatoid arthritis(714.0)   . Sarcoidosis   . Seizures ([place])   . Subarachnoid hemorrhage ([place]) 01/2012   with subdural  hematoma.   . Type II diabetes mellitus ([place])   . Urge incontinence   . Vertigo     Past Surgical History:  Procedure Laterality Date  . BACK SURGERY    .  CARDIAC CATHETERIZATION    . CARPAL TUNNEL RELEASE Bilateral   . CATARACT EXTRACTION W/ INTRAOCULAR LENS IMPLANT Left   . COLONOSCOPY N/A 09/27/2012   Procedure: COLONOSCOPY;  Surgeon: Lafayette Dragon, MD;  Location: [facility] ENDOSCOPY;  Service: Endoscopy;  Laterality: N/A;  . ESOPHAGOGASTRODUODENOSCOPY N/A 09/27/2012   Procedure: ESOPHAGOGASTRODUODENOSCOPY (EGD);  Surgeon: Lafayette Dragon, MD;  Location: [facility];  Service: Endoscopy;  Laterality: N/A;  . KNEE ARTHROSCOPY W/ MENISCAL REPAIR Left 2009  . LUMBAR DISC SURGERY    . LYMPH NODE DISSECTION Right 1970's   "neck; dr thought I had Hodgkins; turned out to be sarcoidosis" (11/15/2012)  . squamous papilloma   2010   removed by Dr. Constance Holster ENT, noted on tongue  . TONSILLECTOMY         History of present illness and  Hospital Course:     Kindly see H&P for history of present illness and admission details, please review complete Labs, Consult reports and Test reports for all details in brief  HPI  from the history and physical done on the day of  admission  HPI: Patrick Holt is a 67 y.o. male with medical history significant of multiple comorbidities including atrial fibrillation, diastolic dysfunction CHF, COPD, conversion disorder, asthma, right heart failure, chronic lower extremity stasis dermatitis who presented to the ER with generalized weakness, shortness of breath, cough as well as hypotension.  Patient was visibly fluid overloaded but blood pressure was in the 80s on arrival.  He has bilateral lower extremity edema as well.  He denied any chest pain.  Denied any diaphoresis.  His shortness of breath is above his typical limits.  He has some cough but nonproductive.  Patient was seen and evaluated in the ER with a picture of anasarca but evidence of right heart failure in the setting of hypotension and palpitation.  He was further evaluated and found to have findings consistent with pulmonary embolism hence he is being admitted for work-up and treatment.  ED Course: She has vitals include temperature of 98, BP is 154/93 pulse is 76, respiratory rates 22 and Sats 94% and wbc 6.7, hb of 12.6, plts 200, Sodium is 146, potassium of 3.9, creatinine is 1.30 and glucose of 101.  Lactic acid 0.80 CT angiogram of the chest confirms pulmonary embolism, it shows Acute saddle pulmonary embolus with emboli extending into the left lower lobe are artery and multiple left lower lobe segmental branches and into all right-sided lobar branches and multiple proximal right segmental branches. Bilateral lower extremity Doppler ultrasound showed no evidence of DVT   Hospital Course  Patrick Holt a 67 y.o.malewith medical history significant ofmultiple comorbidities including atrial fibrillation, diastolic dysfunction CHF, COPD, conversion disorder, asthma, right heart failure, chronic lower extremity stasis dermatitis who presented to the ER with generalized weakness, shortness of breath, cough as well as hypotension, work-up significant for  acute PE   acute saddle pulmonary embolus: -as Evident on CTA chest, patient has sedentary lifestyle,he was on heparin GTT, transition to Eliquis today, 2D echo with no evidence of right heart strain, vital signs are acceptable, but still requiring oxygen 3 L nasal cannula. -Patient with significant cough, no evidence of pneumonia, continue to encourage incentive spirometry and flutter valve at SNF, continue with Mucinex  paroxysmal atrial fibrillation: -Currently on sinus rhythm, rate controlled, on amiodarone, now started on anticoagulation for PE. is  AKI on CKD -peaked at 1.6 , appears to be improving, avoid nephrotoxic medications.  COPD: - No  acute exacerbation. Empiric nebulizer treatments.  tobacco abuse: - Counseling provided.  nicotine patch.  GERD: -Continue with PPIs  seizure disorder:  - Continue with Keppra and Depakote.  essential hypertension:  - Continue with home regimen and monitor blood pressure.  Acute hypoxic respiratory failure -The setting of PE, continue with oxygen, wean as tolerated, he has cough with a productive sputum, encouraged to use incentive spirometry and flutter valve, started on Mucinex    Discharge Condition:  stable   Follow UP  Contact information for after-discharge care    [facility] .   Service:  Skilled Nursing Contact information: [place] [phone]                Discharge Instructions  and  Discharge Medications     Discharge Instructions    Discharge instructions   Complete by:  As directed    Follow with Primary MD Medicine, [facility], or SNF physician in 3 days.  Get CBC, CMP, 2 view Chest X ray checked  by Primary MD next visit.    Activity: As tolerated with Full fall precautions use walker/cane & assistance as needed   Disposition SNF   Diet: Heart Healthy  , with feeding assistance and  aspiration precautions.  For Heart failure patients - Check your Weight same time everyday, if you gain over 2 pounds, or you develop in leg swelling, experience more shortness of breath or chest pain, call your Primary MD immediately. Follow Cardiac Low Salt Diet and 1.5 lit/day fluid restriction.   On your next visit with your primary care physician please Get Medicines reviewed and adjusted.   Please request your Prim.MD to go over all Hospital Tests and Procedure/Radiological results at the follow up, please get all Hospital records sent to your Prim MD by signing hospital release before you go home.   If you experience worsening of your admission symptoms, develop shortness of breath, life threatening emergency, suicidal or homicidal thoughts you must seek medical attention immediately by calling 911 or calling your MD immediately  if symptoms less severe.  You Must read complete instructions/literature along with all the possible adverse reactions/side effects for all the Medicines you take and that have been prescribed to you. Take any new Medicines after you have completely understood and accpet all the possible adverse reactions/side effects.   Do not drive, operating heavy machinery, perform activities at heights, swimming or participation in water activities or provide baby sitting services if your were admitted for syncope or siezures until you have seen by Primary MD or a Neurologist and advised to do so again.  Do not drive when taking Pain medications.    Do not take more than prescribed Pain, Sleep and Anxiety Medications  Special Instructions: If you have smoked or chewed Tobacco  in the last 2 yrs please stop smoking, stop any regular Alcohol  and or any Recreational drug use.  Wear Seat belts while driving.   Please note  You were cared for by a hospitalist during your hospital stay. If you have any questions about your discharge medications or the care you received  while you were in the hospital after you are discharged, you can call the unit and asked to speak with the hospitalist on call if the hospitalist that took care of you is not available. Once you are discharged, your primary care physician will handle any further medical issues. Please note  that NO REFILLS for any discharge medications will be authorized once you are discharged, as it is imperative that you return to your primary care physician (or establish a relationship with a primary care physician if you do not have one) for your aftercare needs so that they can reassess your need for medications and monitor your lab values.   Increase activity slowly   Complete by:  As directed      Allergies as of 10/14/2017      Reactions   Immune Globulins Other (See Comments)   Acute renal failure   Rho (d) Immune Globulin    Acute renal failure Acute renal failure   Ciprofloxacin Swelling   Flagyl [metronidazole] Swelling   Lisinopril Diarrhea   Sulfa Antibiotics Other (See Comments)   blisters   Sulfasalazine    blisters blisters   Cefepime Other (See Comments)   Diffuse maculopapular rash when receiving both vanc and cefepime, timing unclear.   Morphine And Related Other (See Comments)   Ineffective for pain management   Requip [ropinirole Hcl] Other (See Comments)   constipation   Vancomycin    Diffuse maculopapular rash when receiving both vanc/cefepime.  Timing unclear.     Betamethasone Dipropionate Other (See Comments)   Unknown   Betamethasone Dipropionate    Unknown   Bupropion    Unknown   Bupropion Hcl Other (See Comments)   Unknown   Clobetasol Other (See Comments)   Unknown Unknown   Codeine Other (See Comments)   Does not help with pain   Escitalopram Oxalate Other (See Comments)   Unknown   Escitalopram Oxalate    Unknown   Fluoxetine Hcl Other (See Comments)   Unknown   Fluoxetine Hcl    Unknown   Furosemide Other (See Comments)   Unknown Unknown    Meclizine Rash   Paroxetine Other (See Comments)   Unknown   Penicillins Other (See Comments)   Unknown reaction Has patient had a PCN reaction causing immediate rash, facial/tongue/throat swelling, SOB or lightheadedness with hypotension: unknown Has patient had a PCN reaction causing severe rash involving mucus membranes or skin necrosis: unknown Has patient had a PCN reaction that required hospitalization unknown Has patient had a PCN reaction occurring within the last 10 years: unknown If all of the above answers are "NO", then may proceed with Cephalosporin use. Unknown Unknown reactio   Tacrolimus Other (See Comments)   Unknown   Tetanus Toxoid Other (See Comments)   Unknown   Tuberculin Purified Protein Derivative Other (See Comments)   Unknown      Medication List    STOP taking these medications   aspirin EC 81 MG tablet     TAKE these medications   ALPRAZolam 0.5 MG tablet Commonly known as:  XANAX Take 1 tablet (0.5 mg total) by mouth 3 (three) times daily.   amiodarone 200 MG tablet Commonly known as:  PACERONE Take 1 tablet (200 mg total) by mouth daily.   apixaban 5 MG Tabs tablet Commonly known as:  ELIQUIS Take 10 mg oral twice daily for total of 7 days, then transition to 5 mg oral twice daily on 10/21/2017   atorvastatin 40 MG tablet Commonly known as:  LIPITOR Take 1 tablet (40 mg total) by mouth daily.   budesonide-formoterol 160-4.5 MCG/ACT inhaler Commonly known as:  SYMBICORT Inhale 2 puffs into the lungs 2 (two) times daily.   divalproex 250 MG DR tablet Commonly known as:  DEPAKOTE Take 1 tablet (250 mg  total) by mouth 3 (three) times daily.   ergocalciferol 50000 units capsule Commonly known as:  VITAMIN D2 Take 1 capsule (50,000 Units total) by mouth once a week.   esomeprazole 40 MG capsule Commonly known as:  NEXIUM Take 1 capsule (40 mg total) by mouth daily.   ipratropium-albuterol 0.5-2.5 (3) MG/3ML Soln Commonly known as:   DUONEB Inhale 3 mLs into the lungs every 6 (six) hours as needed.   levETIRAcetam 500 MG tablet Commonly known as:  KEPPRA Take 1 tablet (500 mg total) by mouth 2 (two) times daily.   loperamide 2 MG capsule Commonly known as:  IMODIUM Take 1 capsule (2 mg total) by mouth as needed for diarrhea or loose stools.   multivitamin with minerals Tabs tablet Take 1 tablet by mouth daily.   nicotine 21 mg/24hr patch Commonly known as:  NICODERM CQ - dosed in mg/24 hours Place 1 patch (21 mg total) onto the skin daily. Start taking on:  10/15/2017   oxybutynin 5 MG tablet Commonly known as:  DITROPAN Take 1 tablet (5 mg total) by mouth 3 (three) times daily.   Potassium Chloride ER 20 MEQ Tbcr Take 20 mEq by mouth every other day.   predniSONE 10 MG tablet Commonly known as:  DELTASONE Take 1 tablet (10 mg total) by mouth daily with breakfast.   risperiDONE 1 MG tablet Commonly known as:  RISPERDAL Take 1 tablet (1 mg total) by mouth 2 (two) times daily.   senna-docusate 8.6-50 MG tablet Commonly known as:  Senokot-S Take 1 tablet by mouth at bedtime as needed for mild constipation.         Diet and Activity recommendation: See Discharge Instructions above   Consults obtained -  None   Major procedures and Radiology Reports - PLEASE review detailed and final reports for all details, in brief -    Ct Head Wo Contrast  Result Date: 09/17/2017 CLINICAL DATA:  Patient fell last evening, now with lethargy. History of stroke. EXAM: CT HEAD WITHOUT CONTRAST CT CERVICAL SPINE WITHOUT CONTRAST TECHNIQUE: Multidetector CT imaging of the head and cervical spine was performed following the standard protocol without intravenous contrast. Multiplanar CT image reconstructions of the cervical spine were also generated. COMPARISON:  Head CT - 09/03/2017; head and C-spine CT-06/16/2017 FINDINGS: CT HEAD FINDINGS Brain: Moderately advanced atrophy with sulcal prominence and centralized  volume loss with commensurate ex vacuo dilatation the ventricular system. Periventricular hypodensities compatible microvascular ischemic disease. Given background parenchymal abnormalities, there is no CT evidence superimposed acute large territory infarct. No intraparenchymal or extra-axial mass or hemorrhage. Unchanged size and configuration of the ventricles and the basilar cisterns. No midline shift. Vascular: Intracranial atherosclerosis. Skull: No displaced calvarial fracture. Sinuses/Orbits: Limited visualization of the paranasal sinuses and mastoid air cells is normal. No air-fluid levels. A small amount of debris is seen within the bilateral external auditory canals. Post left-sided cataract surgery. Other: Regional soft tissues appear normal. CT CERVICAL SPINE FINDINGS Alignment: C1 to the superior endplate of T2 is imaged. Mild scoliotic curvature of the cervicothoracic spine, potentially positional. Otherwise, normal alignment of the cervical spine. No anterolisthesis or retrolisthesis. Bilateral facets are normally aligned. Skull base and vertebrae: The dens is normally positioned between the lateral masses of C1. Mild degenerative change of the atlantodental articulation. Normal atlantoaxial articulations. Cervical vertebral body heights are preserved. Old fracture involving the T1 spinous process (sagittal image 18, series 10) Soft tissues and spinal canal: Prevertebral soft tissues are normal. Disc levels: Cervical  intervertebral disc space heights appear preserved. Upper chest: Limited visualization of lung apices demonstrates centrilobular emphysematous change. Other: Regional soft tissues appear normal. Calcified atherosclerotic plaque within the bilateral carotid bulbs. Normal noncontrast appearance of the thyroid gland. No bulky cervical lymphadenopathy on this noncontrast examination. IMPRESSION: 1. Similar findings of atrophy and microvascular ischemic disease without superimposed acute  intracranial process. 2. No acute fracture or static subluxation of the cervical spine. 3. Old T1 spinous process fracture. Electronically Signed   By: Sandi Mariscal M.D.   On: 09/17/2017 13:25   Ct Angio Chest Pe W And/or Wo Contrast  Result Date: 10/11/2017 CLINICAL DATA:  Cough and shortness of breath EXAM: CT ANGIOGRAPHY CHEST WITH CONTRAST TECHNIQUE: Multidetector CT imaging of the chest was performed using the standard protocol during bolus administration of intravenous contrast. Multiplanar CT image reconstructions and MIPs were obtained to evaluate the vascular anatomy. CONTRAST:  155mL ISOVUE-370 IOPAMIDOL (ISOVUE-370) INJECTION 76% COMPARISON:  CTA chest 04/06/2017 FINDINGS: Cardiovascular: --Pulmonary arteries: Contrast injection is sufficient to demonstrate satisfactory opacification of the pulmonary arteries to the segmental level.There is a saddle pulmonary embolus that extends along both the right and left pulmonary arteries and into the right upper lobar, inter lobar and left lower lobar arteries. Emboli extend into proximal segmental branches of all right lobes and the left lower lobe. The main pulmonary artery is within normal limits for size. No evidence of right heart strain. --Aorta: Limited opacification of the aorta due to bolus timing optimization for the pulmonary arteries. Conventional 3 vessel aortic branching pattern. The aortic course and caliber are normal. There is mild aortic atherosclerosis. --Heart: Normal size. No pericardial effusion. Mediastinum/Nodes: No mediastinal, hilar or axillary lymphadenopathy. The visualized thyroid and thoracic esophageal course are unremarkable. Lungs/Pleura: There is bibasilar atelectasis. No pleural effusion or pneumothorax. No evidence of acute pulmonary infarction. Upper Abdomen: Contrast bolus timing is not optimized for evaluation of the abdominal organs. Within this limitation, the visualized organs of the upper abdomen are normal.  Musculoskeletal: There is extravasated contrast in the visualized right arm extending into the right axilla. Review of the MIP images confirms the above findings. IMPRESSION: 1. Acute saddle pulmonary embolus with emboli extending into the left lower lobe are artery and multiple left lower lobe segmental branches and into all right-sided lobar branches and multiple proximal right segmental branches. 2. No CT evidence of right heart strain. 3. Extravasated contrast material within the right arm and axilla. Critical Value/emergent results were called by telephone at the time of interpretation on 10/11/2017 at 7:42 pm to Dr. Tomi Bamberger, who verbally acknowledged these results. Aortic Atherosclerosis (ICD10-I70.0). Electronically Signed   By: Ulyses Jarred M.D.   On: 10/11/2017 19:43   Ct Cervical Spine Wo Contrast  Result Date: 09/17/2017 CLINICAL DATA:  Patient fell last evening, now with lethargy. History of stroke. EXAM: CT HEAD WITHOUT CONTRAST CT CERVICAL SPINE WITHOUT CONTRAST TECHNIQUE: Multidetector CT imaging of the head and cervical spine was performed following the standard protocol without intravenous contrast. Multiplanar CT image reconstructions of the cervical spine were also generated. COMPARISON:  Head CT - 09/03/2017; head and C-spine CT-06/16/2017 FINDINGS: CT HEAD FINDINGS Brain: Moderately advanced atrophy with sulcal prominence and centralized volume loss with commensurate ex vacuo dilatation the ventricular system. Periventricular hypodensities compatible microvascular ischemic disease. Given background parenchymal abnormalities, there is no CT evidence superimposed acute large territory infarct. No intraparenchymal or extra-axial mass or hemorrhage. Unchanged size and configuration of the ventricles and the basilar cisterns. No midline shift.  Vascular: Intracranial atherosclerosis. Skull: No displaced calvarial fracture. Sinuses/Orbits: Limited visualization of the paranasal sinuses and mastoid air  cells is normal. No air-fluid levels. A small amount of debris is seen within the bilateral external auditory canals. Post left-sided cataract surgery. Other: Regional soft tissues appear normal. CT CERVICAL SPINE FINDINGS Alignment: C1 to the superior endplate of T2 is imaged. Mild scoliotic curvature of the cervicothoracic spine, potentially positional. Otherwise, normal alignment of the cervical spine. No anterolisthesis or retrolisthesis. Bilateral facets are normally aligned. Skull base and vertebrae: The dens is normally positioned between the lateral masses of C1. Mild degenerative change of the atlantodental articulation. Normal atlantoaxial articulations. Cervical vertebral body heights are preserved. Old fracture involving the T1 spinous process (sagittal image 18, series 10) Soft tissues and spinal canal: Prevertebral soft tissues are normal. Disc levels: Cervical intervertebral disc space heights appear preserved. Upper chest: Limited visualization of lung apices demonstrates centrilobular emphysematous change. Other: Regional soft tissues appear normal. Calcified atherosclerotic plaque within the bilateral carotid bulbs. Normal noncontrast appearance of the thyroid gland. No bulky cervical lymphadenopathy on this noncontrast examination. IMPRESSION: 1. Similar findings of atrophy and microvascular ischemic disease without superimposed acute intracranial process. 2. No acute fracture or static subluxation of the cervical spine. 3. Old T1 spinous process fracture. Electronically Signed   By: Sandi Mariscal M.D.   On: 09/17/2017 13:25   Dg Chest Portable 1 View  Result Date: 10/11/2017 CLINICAL DATA:  67 year old male with hypotension. Bilateral lower extremity edema. EXAM: PORTABLE CHEST 1 VIEW COMPARISON:  09/17/2017 and earlier chest radiographs. FINDINGS: Portable AP semi upright view at 1436 hours. The patient is more rotated to the right. Mildly improved lung volumes and bibasilar ventilation. No  pulmonary edema, pneumothorax or consolidation. No definite pleural effusion. Stable cardiac size and mediastinal contours. Cardiac size within normal limits. Visualized tracheal air column is within normal limits. Negative visible bowel gas pattern. No acute osseous abnormality identified. IMPRESSION: No acute cardiopulmonary abnormality. Electronically Signed   By: Genevie Ann M.D.   On: 10/11/2017 14:49   Dg Chest Portable 1 View  Result Date: 09/17/2017 CLINICAL DATA:  Hypoxemia; Per ED notes, last night around 9-10p pt fell and denies LOC; Hx CVA with R sided deficits; pt lethargic and weak; Hx COPD, sarcoidosis, DM, and HTN. Smoker EXAM: PORTABLE CHEST 1 VIEW COMPARISON:  09/03/2017 FINDINGS: Shallow lung inflation. Heart size is accentuated by portable AP technique. There is stable minimal atelectasis or scarring at the RIGHT lung base. No pulmonary edema. Degenerative changes are seen in thoracic spine. No acute fracture. IMPRESSION: RIGHT LOWER lobe atelectasis. Electronically Signed   By: Nolon Nations M.D.   On: 09/17/2017 12:53    Micro Results     Recent Results (from the past 240 hour(s))  Urine culture     Status: Abnormal   Collection Time: 10/11/17 10:38 PM  Result Value Ref Range Status   Specimen Description URINE, CATHETERIZED  Final   Special Requests NONE  Final   Culture (A)  Final    <10,000 COLONIES/mL INSIGNIFICANT GROWTH Performed at [facility], [address], [place], [place] [address]    Report Status 10/13/2017 FINAL  Final       Today   Subjective:   Patrick Holt today has no headache,no chest or abdominal pain, reports some cough Objective:   Blood pressure 124/74, pulse 75, temperature 98.5 F (36.9 C), temperature source Oral, resp. rate 19, height 5\' 7"  (1.702 m), weight 97.2 kg,  SpO2 91 %.   Intake/Output Summary (Last 24 hours) at 10/14/2017 1551 Last data filed at 10/14/2017 1000 Gross per 24 hour  Intake 975.32 ml  Output 350 ml   Net 625.32 ml    Exam Awake Alert, Oriented X 3, No new F.N deficits, Normal affect Symmetrical Chest wall movement, Good air movement bilaterally, CTAB RRR,No Gallops,Rubs or new Murmurs, No Parasternal Heave +ve B.Sounds, Abd Soft, No tenderness, No rebound - guarding or rigidity. Bilateral lower extremity edema, right > left  Data Review   CBC w Diff:  Lab Results  Component Value Date   WBC 5.6 10/14/2017   HGB 11.3 (L) 10/14/2017   HCT 36.2 (L) 10/14/2017   PLT 175 10/14/2017   LYMPHOPCT 30 10/11/2017   MONOPCT 10 10/11/2017   EOSPCT 1 10/11/2017   BASOPCT 0 10/11/2017    CMP:  Lab Results  Component Value Date   NA 141 10/14/2017   K 3.9 10/14/2017   CL 107 10/14/2017   CO2 29 10/14/2017   BUN 7 (L) 10/14/2017   CREATININE 0.95 10/14/2017   CREATININE 0.97 05/19/2015   PROT 5.1 (L) 10/12/2017   ALBUMIN 2.5 (L) 10/12/2017   BILITOT 0.7 10/12/2017   ALKPHOS 56 10/12/2017   AST 11 (L) 10/12/2017   ALT 10 10/12/2017  .   Total Time in preparing paper work, data evaluation and todays exam - 29 minutes  Phillips Climes M.D on 10/14/2017 at 3:51 PM  [facility]   Office  [phone]

## 2017-10-14 NOTE — Clinical Social Work Note (Signed)
CSW has spoken with AD as well as Admission Director at [facility]. Facility is agreeable to take pt back. AD is agreeable to 10 day LOG. Pt is aware and agreeable. CSW paged MD.   Loletha Grayer, MSW [phone]

## 2017-10-14 NOTE — Evaluation (Signed)
Occupational Therapy Evaluation Patient Details Name: Patrick Holt MRN: [id number] DOB: [birth year] Today's Date: 10/14/2017    History of Present Illness Pt is a 67 y.o. M with significant PMH of multiple comorbidities including atrial fibrillation, diastolic dysfunction CHF, COPD, conversion disorder, asthma, right hear failure, chronic lower extremitys tasis dermatitis who presented to ER with generalized weakness, shortness of breath, cough, work-up significant for acute PE   Clinical Impression   Pt is presenting with B UE tremors, generalized weakness, and poor self/safety awareness that greatly impacts his ability to perform ADLs with prior level of independence/safety. Pt required a great deal of vc for self awareness for condition insight throughout session. Max A provided for self feeding, with pt resistant to attempt adaptive utensils, adamantly stating "they don't work". Max A required to progress to EOB with heavy vc for sequencing transfer. Highly recommend pt d/c to SNF. Without SNF placement pt at high risk for readmission to hospital, pressure injury from inactivity, and malnutrition.     Follow Up Recommendations  SNF;Supervision/Assistance - 24 hour    Equipment Recommendations  Other (comment)(defer to next venue)    Recommendations for Other Services Speech consult;PT consult     Precautions / Restrictions Precautions Precautions: Fall Precaution Comments: mulitple falls in past year; most recent in the past week in which pt spent 12 hours on the floor Restrictions Weight Bearing Restrictions: No      Mobility Bed Mobility Overal bed mobility: Needs Assistance Bed Mobility: Supine to Sit     Supine to sit: Max assist Sit to supine: Max assist   General bed mobility comments: heavy vc for sequencing transfer  Transfers Overall transfer level: Needs assistance Equipment used: Rolling walker (2 wheeled) Transfers: Sit to/from Stand Sit to Stand: Max  assist;+2 physical assistance;+2 safety/equipment         General transfer comment: pt needing extensive directional vc's and for movement initiation     Balance Overall balance assessment: Needs assistance;History of Falls Sitting-balance support: Feet supported Sitting balance-Leahy Scale: Fair       Standing balance-Leahy Scale: (unable to progress)                             ADL either performed or assessed with clinical judgement   ADL Overall ADL's : Needs assistance/impaired Eating/Feeding: Maximal assistance;Bed level(pt resistant to try weighted utensils)   Grooming: Wash/dry face;Bed level;Moderate assistance Grooming Details (indicate cue type and reason): assistance for steadying B UE Upper Body Bathing: Moderate assistance   Lower Body Bathing: Maximal assistance;Bed level   Upper Body Dressing : Moderate assistance   Lower Body Dressing: Total assistance;+2 for physical assistance;Sit to/from stand   Toilet Transfer: Maximal assistance;+2 for physical assistance;+2 for safety/equipment;BSC;Stand-pivot   Toileting- Clothing Manipulation and Hygiene: Total assistance;+2 for physical assistance;+2 for safety/equipment;Sit to/from stand         General ADL Comments: unable to progress from EOB d/t lack of +2 assistance. Clinical judgement used for assistance level     Vision Baseline Vision/History: No visual deficits Patient Visual Report: No change from baseline Vision Assessment?: No apparent visual deficits     Perception     Praxis      Pertinent Vitals/Pain Pain Assessment: No/denies pain     Hand Dominance Right   Extremity/Trunk Assessment Upper Extremity Assessment Upper Extremity Assessment: RUE deficits/detail;LUE deficits/detail RUE Deficits / Details: significant tremors  RUE Sensation: WNL RUE Coordination: decreased gross motor;decreased fine  motor LUE Deficits / Details: tremors noted LUE Sensation: WNL LUE  Coordination: decreased fine motor;decreased gross motor   Lower Extremity Assessment Lower Extremity Assessment: Defer to PT evaluation   Cervical / Trunk Assessment Cervical / Trunk Assessment: Kyphotic   Communication Communication Communication: No difficulties   Cognition Arousal/Alertness: Awake/alert Behavior During Therapy: WFL for tasks assessed/performed Overall Cognitive Status: Impaired/Different from baseline Area of Impairment: Safety/judgement;Awareness;Problem solving                         Safety/Judgement: Decreased awareness of deficits Awareness: Intellectual Problem Solving: Slow processing;Difficulty sequencing General Comments: pt reports STM deficits at [place] expects to be discharged to:: Private residence Living Arrangements: Alone Available Help at Discharge: Personal care attendant Type of Home: Apartment Home Access: Elevator     Home Layout: One level     Bathroom Shower/Tub: Occupational psychologist: Standard     Home Equipment: Environmental consultant - 2 wheels;Cane - single point;Electric scooter   Additional Comments: Ship broker      Prior Functioning/Environment Level of Independence: Engineer, materials / Transfers Assistance Needed: amb short distances with RW and uses scooter for longer distances--was unable to get out of bed for 2 days prior to admission ADL's / Homemaking Assistance Needed: aide assists with bathing/dressing; sponge bathes   Comments: aide comes 3x/week and he reports that he pays out of pocket for an additional 2 days a weej        OT Problem List: Decreased range of motion;Decreased activity tolerance;Impaired balance (sitting and/or standing);Decreased coordination;Decreased cognition;Decreased safety awareness;Decreased knowledge of use of DME or AE;Decreased knowledge of precautions;Cardiopulmonary status limiting  activity;Obesity;Impaired UE functional use;Decreased strength      OT Treatment/Interventions: Self-care/ADL training;Therapeutic exercise;Energy conservation;DME and/or AE instruction;Therapeutic activities;Patient/family education;Balance training    OT Goals(Current goals can be found in the care plan section) Acute Rehab OT Goals Patient Stated Goal: "to go back home OT Goal Formulation: With patient Time For Goal Achievement: 10/24/17 Potential to Achieve Goals: Fair  OT Frequency: Min 2X/week   Barriers to D/C: Decreased caregiver support  Per CSW aide is refusing to return to pt's home          AM-PAC PT "6 Clicks" Daily Activity     Outcome Measure Help from another person eating meals?: A Lot Help from another person taking care of personal grooming?: A Lot Help from another person toileting, which includes using toliet, bedpan, or urinal?: Total Help from another person bathing (including washing, rinsing, drying)?: A Lot Help from another person to put on and taking off regular upper body clothing?: A Lot Help from another person to put on and taking off regular lower body clothing?: Total 6 Click Score: 10   End of Session Equipment Utilized During Treatment: Rolling walker;Oxygen;Gait belt Nurse Communication: Mobility status  Activity Tolerance: Patient limited by fatigue Patient left: in bed;with call bell/phone within reach  OT Visit Diagnosis: Unsteadiness on feet (R26.81);Muscle weakness (generalized) (M62.81);Repeated falls (R29.6)                Time: 7793-9030 OT Time Calculation (min): 50 min Charges:  OT General Charges $OT Visit: 1 Visit OT Evaluation $OT Eval Moderate Complexity: 1 Mod OT Treatments $Self Care/Home Management : 23-37 mins  Curtis Sites OTR/L 10/14/2017, 3:27 PM

## 2017-10-14 NOTE — Progress Notes (Signed)
Report called and given to RN at [facility].

## 2017-10-14 NOTE — Clinical Social Work Placement (Signed)
   CLINICAL SOCIAL WORK PLACEMENT  NOTE  Date:  10/14/2017  Patient Details  Name: Patrick Holt MRN: [id number] Date of Birth: [birth year]  Clinical Social Work is seeking post-discharge placement for this patient at the [place] level of care (*CSW will initial, date and re-position this form in  chart as items are completed):      Patient/family provided with [place] Work Department's list of facilities offering this level of care within the geographic area requested by the patient (or if unable, by the patient's family).  Yes   Patient/family informed of their freedom to choose among providers that offer the needed level of care, that participate in Medicare, Medicaid or managed care program needed by the patient, have an available bed and are willing to accept the patient.      Patient/family informed of [place]'s ownership interest in [facility] and [facility], as well as of the fact that they are under no obligation to receive care at these facilities.  PASRR submitted to EDS on       PASRR number received on       Existing PASRR number confirmed on 10/14/17     FL2 transmitted to all facilities in geographic area requested by pt/family on       FL2 transmitted to all facilities within larger geographic area on       Patient informed that his/her managed care company has contracts with or will negotiate with certain facilities, including the following:        Yes   Patient/family informed of bed offers received.  Patient chooses bed at [facility]     Physician recommends and patient chooses bed at      Patient to be transferred to [facility] on 10/14/17.  Patient to be transferred to facility by [facility]     Patient family notified on 10/14/17 of transfer.  Name of family member notified:  Pt is alert and oriented     PHYSICIAN       Additional Comment:     _______________________________________________ Eileen Stanford, LCSW 10/14/2017, 3:13 PM

## 2017-10-14 NOTE — NC FL2 (Signed)
[place] LEVEL OF CARE SCREENING TOOL     IDENTIFICATION  Patient Name: Patrick Holt Birthdate: 1950-02-18 Sex: male Admission Date (Current Location): 10/11/2017  [facility] and [place] Number:  Herbalist and Address:  [facility], [place] [address], [place], [place] [address]      Provider Number: [id number]  Attending Physician Name and Address:  Elgergawy, Silver Huguenin, MD  Relative Name and Phone Number:  [name] - friend; [phone].    Current Level of Care: Hospital Recommended Level of Care: [place] Prior Approval Number:    Date Approved/Denied:   PASRR Number:    Discharge Plan: SNF    Current Diagnoses: Patient Active Problem List   Diagnosis Date Noted  . Anasarca 10/11/2017  . Pulmonary embolism ([place]) 10/11/2017  . ARF (acute renal failure) ([place]) 09/17/2017  . Unstable angina ([place]) 06/16/2017  . L1 and L2 vertebral fracture ([place]) 04/06/2017  . Multiple rib fractures 04/06/2017  . Evaluation by psychiatric service required 03/07/2017  . Acute metabolic encephalopathy 96/29/5284  . Hypoglycemia 03/04/2017  . Acute respiratory failure with hypoxia ([place]) 07-Mar-202019  . Acute encephalopathy 07-Mar-202019  . Rhabdomyolysis 02/05/2017  . Acute respiratory failure with hypoxia and hypercapnia ([place]) 02/05/2017  . COPD with acute exacerbation ([place]) 02/05/2017  . Bipolar 1 disorder ([place]) 02/05/2017  . Anemia 02/05/2017  . Hypertension 02/05/2017  . Hand fracture, right 02/05/2017  . Narcotic dependence ([place]) 02/05/2017  . Pre-diabetes 02/05/2017  . PAF (paroxysmal atrial fibrillation) ([place]) 02/05/2017  . Frequent falls 02/05/2017  . History of dermatomyositis 02/05/2017  . Seizure disorder ([place]) 02/05/2017  . Bilateral lower extremity edema 02/05/2017  . Pulmonary artery hypertension ([place]) 01/15/2017  . Altered mental status 01/13/2017  . Low oxygen saturation   . Pain in right wrist  02/27/2016  . Altered mental state 11/14/2015  . Severe dehydration 11/14/2015  . Hypernatremia 11/14/2015  . Difficulty in walking, not elsewhere classified   . Right wrist fracture   . Closed displaced fracture of ulnar styloid with routine healing   . Scapholunate ligament injury with no instability   . Falls frequently   . Hypoxia 11/02/2015  . Anxiety 06/06/2015  . Impaired mobility 04/28/2015  . Lumbar spondylosis with myelopathy 03/07/2015  . Nocturia 12/03/2014  . Chronic venous insufficiency 09/23/2014  . Poor social situation 07/30/2014  . Schizophrenia, acute ([place])   . Schizoaffective disorder, bipolar type ([place])   . Decreased visual acuity 06/27/2014  . COPD exacerbation ([place])   . Right sided weakness 01/04/2014  . Essential hypertension, benign   . Atrial fibrillation, unspecified   . Radiculopathy of cervical region   . Polyuria 12/07/2013  . Erectile dysfunction 12/07/2013  . Diverticulitis 08/01/2013  . Acute diverticulitis 08/01/2013  . Hematochezia 06/04/2013  . Dehydration 11/09/2012  . Hypokalemia 10/03/2012  . Benign neoplasm of colon 09/27/2012  . Stricture and stenosis of esophagus 09/27/2012  . Diverticulitis of colon without hemorrhage 07/26/2012  . Polypharmacy 03/26/2012  . Seizures ([place]) 02/10/2012  . Atrial fibrillation ([place]) 02/05/2012  . Coronary atherosclerosis of native coronary artery 02/05/2012  . Radicular low back pain 03/09/2011  . History of seizure disorder 01/29/2011  . Irritable bowel syndrome (IBS) 06/24/2010  . Urge incontinence 06/19/2010  . Tobacco abuse 11/23/2008  . DEPRESSION, MAJOR 05/31/2008  . ISCHEMIC HEART DISEASE 05/31/2008  . RAYNAUD'S DISEASE 05/31/2008  . HIATAL HERNIA 05/31/2008  . Rheumatoid arthritis ([place]) 05/31/2008  . FIBROMYALGIA 05/31/2008  .  Hyperlipidemia 01/26/2008  . Anxiety state 01/26/2008  . HEMORRHOIDS, INTERNAL 01/26/2008  . COPD (chronic obstructive pulmonary disease) ([place]) 01/26/2008  .  ESOPHAGEAL MOTILITY DISORDER 01/26/2008  . GERD 01/26/2008  . Dermatomyositis ([place]) 01/26/2008    Orientation RESPIRATION BLADDER Height & Weight     Self, Time, Situation, Place  O2(Nasal Cannula 2L) Continent Weight: 214 lb 4.6 oz (97.2 kg) Height:  5\' 7"  (170.2 cm)  BEHAVIORAL SYMPTOMS/MOOD NEUROLOGICAL BOWEL NUTRITION STATUS      Incontinent Diet(Heart healthy, thin liquids)  AMBULATORY STATUS COMMUNICATION OF NEEDS Skin   Total Care Verbally PU Stage and Appropriate Care   PU Stage 2 Dressing: (Buttock, foam dressing)                   Personal Care Assistance Level of Assistance  Bathing, Feeding, Dressing Bathing Assistance: Maximum assistance Feeding assistance: Independent Dressing Assistance: Maximum assistance     Functional Limitations Info  Sight, Hearing, Speech Sight Info: Adequate Hearing Info: Adequate Speech Info: Adequate    SPECIAL CARE FACTORS FREQUENCY  PT (By licensed PT), OT (By licensed OT)     PT Frequency: 2x OT Frequency: 2x            Contractures Contractures Info: Not present    Additional Factors Info  Code Status, Allergies Code Status Info: Full Code Allergies Info: Immune Globulins, Rho (D) Immune Globulin, Ciprofloxacin, Flagyl Metronidazole, Lisinopril, Sulfa Antibiotics, Sulfasalazine, Cefepime, Morphine And Related, Requip Ropinirole Hcl, Vancomycin, Betamethasone Dipropionate, Betamethasone Dipropionate, Bupropion, Bupropion Hcl, Clobetasol, Codeine, Escitalopram Oxalate, Escitalopram Oxalate, Fluoxetine Hcl, Fluoxetine Hcl, Furosemide, Meclizine, Paroxetine, Penicillins, Tacrolimus, Tetanus Toxoid, Tuberculin Purified Protein Derivative           Current Medications (10/14/2017):  This is the current hospital active medication list Current Facility-Administered Medications  Medication Dose Route Frequency Provider Last Rate Last Dose  . 0.9 %  sodium chloride infusion  250 mL Intravenous PRN Elwyn Reach, MD       . ALPRAZolam Duanne Moron) tablet 0.5 mg  0.5 mg Oral TID Gala Romney L, MD   0.5 mg at 10/14/17 1002  . amiodarone (PACERONE) tablet 200 mg  200 mg Oral Daily Gala Romney L, MD   200 mg at 10/14/17 1001  . apixaban (ELIQUIS) tablet 10 mg  10 mg Oral BID Joselyn Glassman A, RPH   10 mg at 10/14/17 1002   Followed by  . [START ON 10/21/2017] apixaban (ELIQUIS) tablet 5 mg  5 mg Oral BID Joselyn Glassman A, RPH      . atorvastatin (LIPITOR) tablet 40 mg  40 mg Oral Daily Gala Romney L, MD   40 mg at 10/14/17 1001  . divalproex (DEPAKOTE) DR tablet 250 mg  250 mg Oral TID Elwyn Reach, MD   250 mg at 10/14/17 1003  . guaiFENesin (MUCINEX) 12 hr tablet 1,200 mg  1,200 mg Oral BID Elgergawy, Silver Huguenin, MD   1,200 mg at 10/14/17 1002  . ipratropium-albuterol (DUONEB) 0.5-2.5 (3) MG/3ML nebulizer solution 3 mL  3 mL Inhalation Q6H PRN Gala Romney L, MD      . levETIRAcetam (KEPPRA) tablet 500 mg  500 mg Oral BID Gala Romney L, MD   500 mg at 10/14/17 1002  . loperamide (IMODIUM) capsule 2 mg  2 mg Oral PRN Elwyn Reach, MD      . mometasone-formoterol (DULERA) 200-5 MCG/ACT inhaler 2 puff  2 puff Inhalation BID Elwyn Reach, MD   2 puff at 10/14/17 0847  .  multivitamin with minerals tablet 1 tablet  1 tablet Oral Daily Elwyn Reach, MD   1 tablet at 10/14/17 1001  . nicotine (NICODERM CQ - dosed in mg/24 hours) patch 21 mg  21 mg Transdermal Daily Elwyn Reach, MD   21 mg at 10/13/17 0940  . ondansetron (ZOFRAN) tablet 4 mg  4 mg Oral Q6H PRN Elwyn Reach, MD       Or  . ondansetron (ZOFRAN) injection 4 mg  4 mg Intravenous Q6H PRN Gala Romney L, MD      . oxybutynin (DITROPAN) tablet 5 mg  5 mg Oral TID Elwyn Reach, MD   5 mg at 10/14/17 1001  . pantoprazole (PROTONIX) EC tablet 40 mg  40 mg Oral Daily Gala Romney L, MD   40 mg at 10/14/17 1002  . polyvinyl alcohol (LIQUIFILM TEARS) 1.4 % ophthalmic solution 2 drop  2 drop Both Eyes QID PRN Elgergawy,  Silver Huguenin, MD   2 drop at 10/14/17 1011  . potassium chloride (K-DUR,KLOR-CON) CR tablet 20 mEq  20 mEq Oral QODAY Gala Romney L, MD   20 mEq at 10/14/17 1002  . risperiDONE (RISPERDAL) tablet 1 mg  1 mg Oral BID Gala Romney L, MD   1 mg at 10/14/17 1002  . senna-docusate (Senokot-S) tablet 1 tablet  1 tablet Oral QHS PRN Elwyn Reach, MD   1 tablet at 10/12/17 2145  . sodium chloride 0.9 % bolus 250 mL  250 mL Intravenous Once Tegeler, Gwenyth Allegra, MD      . sodium chloride flush (NS) 0.9 % injection 3 mL  3 mL Intravenous Q12H Gala Romney L, MD   3 mL at 10/14/17 1000  . sodium chloride flush (NS) 0.9 % injection 3 mL  3 mL Intravenous PRN Elwyn Reach, MD      . Vitamin D (Ergocalciferol) (DRISDOL) capsule 50,000 Units  50,000 Units Oral Weekly Elwyn Reach, MD   50,000 Units at 10/14/17 1003     Discharge Medications: Please see discharge summary for a list of discharge medications.  Relevant Imaging Results:  Relevant Lab Results:   Additional Information ss#[id number]  Eileen Stanford, LCSW

## 2017-10-14 NOTE — Discharge Instructions (Signed)
Information on my medicine - ELIQUIS (apixaban)  This medication education was reviewed with me or my healthcare representative as part of my discharge preparation.    Why was Eliquis prescribed for you? Eliquis was prescribed to treat blood clots that may have been found in the veins of your legs (deep vein thrombosis) or in your lungs (pulmonary embolism) and to reduce the risk of them occurring again.  What do You need to know about Eliquis ? The starting dose is 10 mg (two 5 mg tablets) taken TWICE daily for the FIRST SEVEN (7) DAYS, then on (enter date)  10/21/17  the dose is reduced to ONE 5 mg tablet taken TWICE daily.  Eliquis may be taken with or without food.   Try to take the dose about the same time in the morning and in the evening. If you have difficulty swallowing the tablet whole please discuss with your pharmacist how to take the medication safely.  Take Eliquis exactly as prescribed and DO NOT stop taking Eliquis without talking to the doctor who prescribed the medication.  Stopping may increase your risk of developing a new blood clot.  Refill your prescription before you run out.  After discharge, you should have regular check-up appointments with your healthcare provider that is prescribing your Eliquis.    What do you do if you miss a dose? If a dose of ELIQUIS is not taken at the scheduled time, take it as soon as possible on the same day and twice-daily administration should be resumed. The dose should not be doubled to make up for a missed dose.  Important Safety Information A possible side effect of Eliquis is bleeding. You should call your healthcare provider right away if you experience any of the following: ? Bleeding from an injury or your nose that does not stop. ? Unusual colored urine (red or dark brown) or unusual colored stools (red or black). ? Unusual bruising for unknown reasons. ? A serious fall or if you hit your head (even if there is no  bleeding).  Some medicines may interact with Eliquis and might increase your risk of bleeding or clotting while on Eliquis. To help avoid this, consult your healthcare provider or pharmacist prior to using any new prescription or non-prescription medications, including herbals, vitamins, non-steroidal anti-inflammatory drugs (NSAIDs) and supplements.  This website has more information on Eliquis (apixaban): http://www.eliquis.com/eliquis/home

## 2017-10-14 NOTE — Progress Notes (Addendum)
[place] for IV Heparin  Indication: pulmonary embolus  Patient Measurements: Height: 5\' 7"  (170.2 cm) Weight: 214 lb 4.6 oz (97.2 kg) IBW/kg (Calculated) : 66.1 Heparin Dosing Weight: 78.6 kg  Vital Signs: Temp: 99 F (37.2 C) (08/30 0515) Temp Source: Oral (08/30 0515) BP: 97/64 (08/30 0515) Pulse Rate: 68 (08/30 0515)  Labs: Recent Labs    10/12/17 0521 10/12/17 0834 10/12/17 1146 10/12/17 1518 10/13/17 0424 10/14/17 0458  HGB 11.9*  --   --   --  11.7* 11.3*  HCT 38.9*  --   --   --  37.8* 36.2*  PLT 178  --   --   --  182 175  HEPARINUNFRC  --   --  0.30  --  0.40 0.31  CREATININE 1.61*  --   --   --  1.06 0.95  TROPONINI <0.03 <0.03  --  <0.03  --   --     Estimated Creatinine Clearance: 82.6 mL/min (by C-G formula based on SCr of 0.95 mg/dL).   Medical History: Past Medical History:  Diagnosis Date  . Anemia   . Anxiety   . Asthma   . Atrial fibrillation ([place]) 01/2012   with RVR  . Bipolar 1 disorder ([place])   . Cancer ([place])   . Collagen vascular disease ([place])   . Conversion disorder 06/2010  . COPD (chronic obstructive pulmonary disease) ([place])   . Dermatomyositis ([place])   . Dermatomyositis ([place])   . Diverticulitis   . Diverticulosis of colon   . Dysrhythmia    "irregular" (11/15/2012)  . Esophageal dysmotility   . Esophageal stricture   . Fibromyalgia   . Gastritis   . GERD (gastroesophageal reflux disease)   . Hand fracture, right    sept 13, 2017, still wearing splint as og 01-10-2017  . Headache(784.0)    "severe; get them often" (11/15/2012)  . Hiatal hernia   . History of narcotic addiction ([place])   . Hx of adenomatous colonic polyps   . Hyperlipidemia   . Hypertension   . Internal hemorrhoids   . Ischemic heart disease   . Major depression    with acute psychotic break in 06/2010  . Myocardial infarct ([place])    mulitple (1999, 2000, 2004)  . Narcotic dependence ([place])   . Nephrolithiasis   .  Obesity   . OCD (obsessive compulsive disorder)   . Otosclerosis   . Paroxysmal A-fib ([place])   . Peripheral neuropathy   . Raynaud's disease   . Renal insufficiency   . Rheumatoid arthritis(714.0)   . Sarcoidosis   . Seizures ([place])   . Subarachnoid hemorrhage ([place]) 01/2012   with subdural  hematoma.   . Type II diabetes mellitus ([place])   . Urge incontinence   . Vertigo     Medications:  Scheduled:  . ALPRAZolam  0.5 mg Oral TID  . amiodarone  200 mg Oral Daily  . atorvastatin  40 mg Oral Daily  . divalproex  250 mg Oral TID  . guaiFENesin  1,200 mg Oral BID  . levETIRAcetam  500 mg Oral BID  . mometasone-formoterol  2 puff Inhalation BID  . multivitamin with minerals  1 tablet Oral Daily  . nicotine  21 mg Transdermal Daily  . oxybutynin  5 mg Oral TID  . pantoprazole  40 mg Oral Daily  . potassium chloride  20 mEq Oral QODAY  . risperiDONE  1 mg Oral BID  . sodium  chloride flush  3 mL Intravenous Q12H  . Vitamin D (Ergocalciferol)  50,000 Units Oral Weekly   Infusions:  . sodium chloride    . heparin 1,350 Units/hr (10/14/17 0516)  . sodium chloride      Assessment: [name] presents with shortness of breath, cough, leg pain and leg swelling starting 3 days ago. PMH includes asthma, COPD, CAD, heart failure, and DVT. Chest CT shows evidence of pulmonary embolus with emboli extending to lower lobe. Pharmacy consulted to dose heparin for PE treatment.   Heparin level this AM is therapeutic at 0.31. Heparin level on lower end of range, will increase slightly. Hgb stable. Scr decreased to 1.06. F/u change to DOAC therapy   Goal of Therapy:  Heparin level 0.3-0.7 units/ml Monitor platelets by anticoagulation protocol: Yes   Plan:  increase heparin to 1400 units/hr Daily Heparin level Monitor for s/sx of bleeding   Addendum:  Patient to start apixaban per MD. Will load patient with 10mg  BID for 7 days, then 5mg  BID thereafter. Will turn off heparin drip at first dose  of apixaban.   Banita Lehn A. Levada Dy, PharmD, [place] Pager: [phone] Please utilize [facility] for appropriate phone number to reach the unit pharmacist ([place])

## 2017-10-15 ENCOUNTER — Inpatient Hospital Stay: Payer: Self-pay

## 2017-10-15 DIAGNOSIS — J9601 Acute respiratory failure with hypoxia: Secondary | ICD-10-CM

## 2017-10-15 LAB — CBC
HCT: 37.8 % — ABNORMAL LOW (ref 39.0–52.0)
Hemoglobin: 11.6 g/dL — ABNORMAL LOW (ref 13.0–17.0)
MCH: 27.8 pg (ref 26.0–34.0)
MCHC: 30.7 g/dL (ref 30.0–36.0)
MCV: 90.6 fL (ref 78.0–100.0)
PLATELETS: 177 10*3/uL (ref 150–400)
RBC: 4.17 MIL/uL — AB (ref 4.22–5.81)
RDW: 20.8 % — AB (ref 11.5–15.5)
WBC: 6.5 10*3/uL (ref 4.0–10.5)

## 2017-10-15 LAB — TROPONIN I

## 2017-10-15 MED ORDER — FUROSEMIDE 10 MG/ML IJ SOLN: 40.0000 mg | Freq: Once | INTRAMUSCULAR | Status: DC

## 2017-10-15 MED ORDER — AZTREONAM 2 G IJ SOLR
2.0000 g | Freq: Three times a day (TID) | INTRAMUSCULAR | Status: DC
  Administered 2017-10-15: 2 g via INTRAMUSCULAR
  Filled 2017-10-15 (×3): qty 2

## 2017-10-15 MED ORDER — VANCOMYCIN HCL 10 G IV SOLR
1250.0000 mg | Freq: Two times a day (BID) | INTRAVENOUS | Status: DC
  Administered 2017-10-16 – 2017-10-18 (×5): 1250 mg via INTRAVENOUS
  Filled 2017-10-15 (×7): qty 1250

## 2017-10-15 MED ORDER — BUMETANIDE 1 MG PO TABS
1.0000 mg | ORAL_TABLET | Freq: Once | ORAL | Status: AC
  Administered 2017-10-15: 1 mg via ORAL
  Filled 2017-10-15: qty 1

## 2017-10-15 MED ORDER — VANCOMYCIN HCL 10 G IV SOLR
2000.0000 mg | Freq: Once | INTRAVENOUS | Status: DC
  Filled 2017-10-15: qty 2000

## 2017-10-15 NOTE — Progress Notes (Addendum)
Pharmacy Antibiotic Note  Patrick Holt is a 67 y.o. male admitted on 10/11/2017 with generalized weakness, shortness of breath, cough as well as hypotension.  Pharmacy has been consulted for vancomycin and aztreonam dosing for HCAP. Pt has allergy listed to vanc when given with cefepime and separate PCN allergy listed, however, he is unable to clarify reactions. Of note, pt has had vancomycin over the years with no reaction.   WBC nml, afebrile, bibasilar atelectasis/infiltrate on CXR.  Plan: Vancomycin 2000mg  IV bolus x1 followed by vancomycin 1250mg  IV every 12 hours.  Goal trough 15-20 mcg/mL. Will infuse over 240 mins to decrease risk of rash Aztreonam 2g Q8hrs  Monitor vanc trough at steady state Monitor allergic reactions  Height: 5\' 7"  (170.2 cm) Weight: 214 lb 4.6 oz (97.2 kg) IBW/kg (Calculated) : 66.1  Temp (24hrs), Avg:99.1 F (37.3 C), Min:98.7 F (37.1 C), Max:99.7 F (37.6 C)  Recent Labs  Lab 10/11/17 1454 10/11/17 1513 10/11/17 1838 10/12/17 0225 10/12/17 0521 10/13/17 0424 10/14/17 0458 10/15/17 0708  WBC 6.7  --   --  5.5 6.0 5.9 5.6 6.5  CREATININE 1.30*  --   --   --  1.61* 1.06 0.95  --   LATICACIDVEN  --  1.13 0.80  --   --   --   --   --     Estimated Creatinine Clearance: 82.6 mL/min (by C-G formula based on SCr of 0.95 mg/dL).    Allergies  Allergen Reactions  . Immune Globulins Other (See Comments)    Acute renal failure  . Rho (D) Immune Globulin     Acute renal failure Acute renal failure  . Ciprofloxacin Swelling  . Flagyl [Metronidazole] Swelling  . Lisinopril Diarrhea  . Sulfa Antibiotics Other (See Comments)    blisters  . Sulfasalazine     blisters blisters  . Cefepime Other (See Comments)    Diffuse maculopapular rash when receiving both vanc and cefepime, timing unclear.  . Morphine And Related Other (See Comments)    Ineffective for pain management  . Requip [Ropinirole Hcl] Other (See Comments)    constipation  .  Vancomycin     Diffuse maculopapular rash when receiving both vanc/cefepime.  Timing unclear.    . Betamethasone Dipropionate Other (See Comments)    Unknown  . Betamethasone Dipropionate     Unknown  . Bupropion     Unknown  . Bupropion Hcl Other (See Comments)    Unknown  . Clobetasol Other (See Comments)    Unknown Unknown  . Codeine Other (See Comments)    Does not help with pain  . Escitalopram Oxalate Other (See Comments)    Unknown  . Escitalopram Oxalate     Unknown  . Fluoxetine Hcl Other (See Comments)    Unknown  . Fluoxetine Hcl     Unknown  . Furosemide Other (See Comments)    Unknown Unknown  . Meclizine Rash  . Paroxetine Other (See Comments)    Unknown  . Penicillins Other (See Comments)    Unknown reaction Has patient had a PCN reaction causing immediate rash, facial/tongue/throat swelling, SOB or lightheadedness with hypotension: unknown Has patient had a PCN reaction causing severe rash involving mucus membranes or skin necrosis: unknown Has patient had a PCN reaction that required hospitalization unknown Has patient had a PCN reaction occurring within the last 10 years: unknown If all of the above answers are "NO", then may proceed with Cephalosporin use. Unknown Unknown reactio  . Tacrolimus  Other (See Comments)    Unknown  . Tetanus Toxoid Other (See Comments)    Unknown  . Tuberculin Purified Protein Derivative Other (See Comments)    Unknown    Antimicrobials this admission: vancomycin 8/31 >>  Aztreonam 8/31 >>   Dose adjustments this admission:   Microbiology results:  Thank you for allowing pharmacy to be a part of this patient's care.  Juanell Fairly, PharmD PGY1 Pharmacy Resident Phone [phone] 10/15/2017 10:12 AM

## 2017-10-15 NOTE — Progress Notes (Signed)
Spoke with RN Mickel Baas at this time, patient has had 4 PIV's since admission on 8/27, patient at this time requires another access site due to infiltration of PIV. Recommended patient have a Midline due to discharge possible today or tomorrow. Patient still has orders for Vanc and Normal Saline.

## 2017-10-15 NOTE — Progress Notes (Signed)
SPoke with RN.  Unable to place PICC this evening, will recheck with the RN in the morning.  RN to notify MD of placement date.

## 2017-10-15 NOTE — Progress Notes (Signed)
PROGRESS NOTE                                                                                                                                                                                                             Patient Demographics:    Patrick Holt, is a 67 y.o. male, DOB - 03-12-1950, BTD:[id number]  Admit date - 10/11/2017   Admitting Physician Elwyn Reach, MD  Outpatient Primary MD for the patient is [facility]  LOS - 4   Chief Complaint  Patient presents with  . Hypotension       Brief Narrative    Patrick Holt is a 67 y.o. male with medical history significant of multiple comorbidities including atrial fibrillation, diastolic dysfunction CHF, COPD, conversion disorder, asthma, right heart failure, chronic lower extremity stasis dermatitis who presented to the ER with generalized weakness, shortness of breath, cough as well as hypotension, work-up significant for acute PE, a day of discharge 10/14/2017, patient with significant hypoxemia requiring nonrebreather, chest x-ray significant for pneumonia.   Subjective:    Patrick Holt today has, No headache, No chest pain, No abdominal pain , patient with significant hypoxemia overnight, requiring nonrebreather, currently back on 4 L nasal cannula .   Assessment  & Plan :    Principal Problem:   Pulmonary embolism (HCC) Active Problems:   DEPRESSION, MAJOR   Tobacco abuse   COPD (chronic obstructive pulmonary disease) (HCC)   GERD   History of seizure disorder   Essential hypertension, benign   Hypernatremia   PAF (paroxysmal atrial fibrillation) (HCC)   ARF (acute renal failure) (HCC)   Anasarca   acute saddle pulmonary embolus: -as Evident on CTA chest, patient has sedentary lifestyle, 2D echo with no evidence of right heart strain, he had negative troponins, doing well on 3 L nasal cannula, initially on heparin GTT, transition to  Eliquis.  Acute hypoxic/hypoxemic respiratory failure -Patient on room air at baseline, since admission he is on 3 L, yesterday evening before discharge she had an episodes of acute decompensation, with hypoxia and hypoxemia as well, requiring nonrebreather bag, improved during the day, chest x-ray significant for pneumonia. -Poor respiratory effort is major concern, despite encouraging multiple times to use incentive spirometry and flutter valve, he remains with poor respiratory effort, he was encouraged again today.  HCAP -His x-ray yesterday evening significant for up pneumonia, giving his multiple allergies he will be continued on Vanco and aztreonam.  paroxysmal atrial fibrillation: -Currently on sinus rhythm, rate controlled, on amiodarone, now started on anticoagulation for PE.  anasarca:  - Secondary to right heart failure.  Patient has also low albumin so could be contributing.    Renal function increased to 1.6 today, hold on diuresis  AKI on CKD -peaked at 1.6 , appears to be improving, avoid nephrotoxic medications.  COPD:  - No acute exacerbation.  Empiric nebulizer treatments.  tobacco abuse:  - Counseling provided.  Will offer nicotine patch.  GERD: - Continue with PPIs  seizure disorder:  - Continue with Keppra and Depakote.  essential hypertension:   - Continue with home regimen and monitor blood pressure.   Code Status : Full code  Family Communication  : none at bedside  Disposition Plan  : Need SNF placement, discharge was held yesterday giving respiratory distress  Consults  :  None  Procedures  : None  DVT Prophylaxis  :  Heparin GTT>> Eliquis  Lab Results  Component Value Date   PLT 177 10/15/2017    Antibiotics  :    Anti-infectives (From admission, onward)   Start     Dose/Rate Route Frequency Ordered Stop   10/15/17 2330  vancomycin (VANCOCIN) 1,250 mg in sodium chloride 0.9 % 250 mL IVPB     1,250 mg 62.5 mL/hr over 240 Minutes  Intravenous Every 12 hours 10/15/17 1026     10/15/17 1300  aztreonam (AZACTAM) injection 2 g     2 g Intramuscular Every 8 hours 10/15/17 1026     10/15/17 1130  vancomycin (VANCOCIN) 2,000 mg in sodium chloride 0.9 % 500 mL IVPB     2,000 mg 125 mL/hr over 240 Minutes Intravenous  Once 10/15/17 1026          Objective:   Vitals:   10/15/17 0214 10/15/17 0450 10/15/17 0803 10/15/17 0900  BP:  108/64  110/65  Pulse: 76 71  77  Resp:  (!) 21  (!) 22  Temp:  98.9 F (37.2 C)  98.7 F (37.1 C)  TempSrc:  Oral  Skin  SpO2: 91% 92% (!) 89% 93%  Weight:      Height:        Wt Readings from Last 3 Encounters:  10/11/17 97.2 kg  09/07/17 105.7 kg  06/16/17 86.2 kg     Intake/Output Summary (Last 24 hours) at 10/15/2017 1448 Last data filed at 10/15/2017 1036 Gross per 24 hour  Intake 700 ml  Output 1700 ml  Net -1000 ml     Physical Exam  Awake Alert, Oriented X 3, frail, chronically ill-appearing, laying in bed in no apparent distress Fair entry bilaterally, diminished at the bases with some rales Regular regular, some rubs and gallops +ve B.Sounds, Abd Soft, No tenderness, No rebound - guarding or rigidity. Lateral lower extremity edema, right more than left    Data Review:    CBC Recent Labs  Lab 10/11/17 1454 10/12/17 0225 10/12/17 0521 10/13/17 0424 10/14/17 0458 10/15/17 0708  WBC 6.7 5.5 6.0 5.9 5.6 6.5  HGB 12.6* 11.9* 11.9* 11.7* 11.3* 11.6*  HCT 41.5 38.4* 38.9* 37.8* 36.2* 37.8*  PLT 200 195 178 182 175 177  MCV 92.8 91.2 92.4 90.9 90.3 90.6  MCH 28.2 28.3 28.3 28.1 28.2 27.8  MCHC 30.4 31.0 30.6 31.0 31.2 30.7  RDW 21.5* 21.2* 21.2* 21.0* 20.6*  20.8*  LYMPHSABS 2.0  --   --   --   --   --   MONOABS 0.7  --   --   --   --   --   EOSABS 0.1  --   --   --   --   --   BASOSABS 0.0  --   --   --   --   --     Chemistries  Recent Labs  Lab 10/11/17 1454 10/12/17 0521 10/13/17 0424 10/14/17 0458  NA 146* 144 143 141  K 3.9 3.9 3.9 3.9    CL 109 110 109 107  CO2 29 24 29 29   GLUCOSE 101* 76 119* 97  BUN 14 14 8  7*  CREATININE 1.30* 1.61* 1.06 0.95  CALCIUM 9.0 8.5* 8.4* 8.5*  MG 2.0  --   --   --   AST 11* 11*  --   --   ALT 11 10  --   --   ALKPHOS 65 56  --   --   BILITOT 1.1 0.7  --   --    ------------------------------------------------------------------------------------------------------------------ No results for input(s): CHOL, HDL, LDLCALC, TRIG, CHOLHDL, LDLDIRECT in the last 72 hours.  Lab Results  Component Value Date   HGBA1C 5.9 (H) 01/14/2017   ------------------------------------------------------------------------------------------------------------------ No results for input(s): TSH, T4TOTAL, T3FREE, THYROIDAB in the last 72 hours.  Invalid input(s): FREET3 ------------------------------------------------------------------------------------------------------------------ No results for input(s): VITAMINB12, FOLATE, FERRITIN, TIBC, IRON, RETICCTPCT in the last 72 hours.  Coagulation profile No results for input(s): INR, PROTIME in the last 168 hours.  No results for input(s): DDIMER in the last 72 hours.  Cardiac Enzymes Recent Labs  Lab 10/12/17 0834 10/12/17 1518 10/15/17 0708  TROPONINI <0.03 <0.03 <0.03   ------------------------------------------------------------------------------------------------------------------    Component Value Date/Time   BNP 20.8 10/11/2017 1454    Inpatient Medications  Scheduled Meds: . ALPRAZolam  0.5 mg Oral TID  . amiodarone  200 mg Oral Daily  . apixaban  10 mg Oral BID   Followed by  . [START ON 10/21/2017] apixaban  5 mg Oral BID  . atorvastatin  40 mg Oral Daily  . aztreonam  2 g Intramuscular Q8H  . bumetanide  1 mg Oral Once  . divalproex  250 mg Oral TID  . guaiFENesin  1,200 mg Oral BID  . levETIRAcetam  500 mg Oral BID  . mometasone-formoterol  2 puff Inhalation BID  . multivitamin with minerals  1 tablet Oral Daily  . nicotine   21 mg Transdermal Daily  . oxybutynin  5 mg Oral TID  . pantoprazole  40 mg Oral Daily  . potassium chloride  20 mEq Oral QODAY  . risperiDONE  1 mg Oral BID  . sodium chloride flush  3 mL Intravenous Q12H  . Vitamin D (Ergocalciferol)  50,000 Units Oral Weekly   Continuous Infusions: . sodium chloride    . sodium chloride    . vancomycin    . vancomycin     PRN Meds:.sodium chloride, ipratropium-albuterol, loperamide, ondansetron **OR** ondansetron (ZOFRAN) IV, polyvinyl alcohol, senna-docusate, sodium chloride flush  Micro Results Recent Results (from the past 240 hour(s))  Urine culture     Status: Abnormal   Collection Time: 10/11/17 10:38 PM  Result Value Ref Range Status   Specimen Description URINE, CATHETERIZED  Final   Special Requests NONE  Final   Culture (A)  Final    <10,000 COLONIES/mL INSIGNIFICANT GROWTH Performed at [facility],  [address], [place], [place] [address]    Report Status 10/13/2017 FINAL  Final    Radiology Reports Ct Head Wo Contrast  Result Date: 09/17/2017 CLINICAL DATA:  Patient fell last evening, now with lethargy. History of stroke. EXAM: CT HEAD WITHOUT CONTRAST CT CERVICAL SPINE WITHOUT CONTRAST TECHNIQUE: Multidetector CT imaging of the head and cervical spine was performed following the standard protocol without intravenous contrast. Multiplanar CT image reconstructions of the cervical spine were also generated. COMPARISON:  Head CT - 09/03/2017; head and C-spine CT-06/16/2017 FINDINGS: CT HEAD FINDINGS Brain: Moderately advanced atrophy with sulcal prominence and centralized volume loss with commensurate ex vacuo dilatation the ventricular system. Periventricular hypodensities compatible microvascular ischemic disease. Given background parenchymal abnormalities, there is no CT evidence superimposed acute large territory infarct. No intraparenchymal or extra-axial mass or hemorrhage. Unchanged size and configuration of the ventricles  and the basilar cisterns. No midline shift. Vascular: Intracranial atherosclerosis. Skull: No displaced calvarial fracture. Sinuses/Orbits: Limited visualization of the paranasal sinuses and mastoid air cells is normal. No air-fluid levels. A small amount of debris is seen within the bilateral external auditory canals. Post left-sided cataract surgery. Other: Regional soft tissues appear normal. CT CERVICAL SPINE FINDINGS Alignment: C1 to the superior endplate of T2 is imaged. Mild scoliotic curvature of the cervicothoracic spine, potentially positional. Otherwise, normal alignment of the cervical spine. No anterolisthesis or retrolisthesis. Bilateral facets are normally aligned. Skull base and vertebrae: The dens is normally positioned between the lateral masses of C1. Mild degenerative change of the atlantodental articulation. Normal atlantoaxial articulations. Cervical vertebral body heights are preserved. Old fracture involving the T1 spinous process (sagittal image 18, series 10) Soft tissues and spinal canal: Prevertebral soft tissues are normal. Disc levels: Cervical intervertebral disc space heights appear preserved. Upper chest: Limited visualization of lung apices demonstrates centrilobular emphysematous change. Other: Regional soft tissues appear normal. Calcified atherosclerotic plaque within the bilateral carotid bulbs. Normal noncontrast appearance of the thyroid gland. No bulky cervical lymphadenopathy on this noncontrast examination. IMPRESSION: 1. Similar findings of atrophy and microvascular ischemic disease without superimposed acute intracranial process. 2. No acute fracture or static subluxation of the cervical spine. 3. Old T1 spinous process fracture. Electronically Signed   By: Sandi Mariscal M.D.   On: 09/17/2017 13:25   Ct Angio Chest Pe W And/or Wo Contrast  Result Date: 10/11/2017 CLINICAL DATA:  Cough and shortness of breath EXAM: CT ANGIOGRAPHY CHEST WITH CONTRAST TECHNIQUE:  Multidetector CT imaging of the chest was performed using the standard protocol during bolus administration of intravenous contrast. Multiplanar CT image reconstructions and MIPs were obtained to evaluate the vascular anatomy. CONTRAST:  165mL ISOVUE-370 IOPAMIDOL (ISOVUE-370) INJECTION 76% COMPARISON:  CTA chest 04/06/2017 FINDINGS: Cardiovascular: --Pulmonary arteries: Contrast injection is sufficient to demonstrate satisfactory opacification of the pulmonary arteries to the segmental level.There is a saddle pulmonary embolus that extends along both the right and left pulmonary arteries and into the right upper lobar, inter lobar and left lower lobar arteries. Emboli extend into proximal segmental branches of all right lobes and the left lower lobe. The main pulmonary artery is within normal limits for size. No evidence of right heart strain. --Aorta: Limited opacification of the aorta due to bolus timing optimization for the pulmonary arteries. Conventional 3 vessel aortic branching pattern. The aortic course and caliber are normal. There is mild aortic atherosclerosis. --Heart: Normal size. No pericardial effusion. Mediastinum/Nodes: No mediastinal, hilar or axillary lymphadenopathy. The visualized thyroid and thoracic esophageal course are unremarkable. Lungs/Pleura:  There is bibasilar atelectasis. No pleural effusion or pneumothorax. No evidence of acute pulmonary infarction. Upper Abdomen: Contrast bolus timing is not optimized for evaluation of the abdominal organs. Within this limitation, the visualized organs of the upper abdomen are normal. Musculoskeletal: There is extravasated contrast in the visualized right arm extending into the right axilla. Review of the MIP images confirms the above findings. IMPRESSION: 1. Acute saddle pulmonary embolus with emboli extending into the left lower lobe are artery and multiple left lower lobe segmental branches and into all right-sided lobar branches and multiple  proximal right segmental branches. 2. No CT evidence of right heart strain. 3. Extravasated contrast material within the right arm and axilla. Critical Value/emergent results were called by telephone at the time of interpretation on 10/11/2017 at 7:42 pm to Dr. Tomi Bamberger, who verbally acknowledged these results. Aortic Atherosclerosis (ICD10-I70.0). Electronically Signed   By: Ulyses Jarred M.D.   On: 10/11/2017 19:43   Ct Cervical Spine Wo Contrast  Result Date: 09/17/2017 CLINICAL DATA:  Patient fell last evening, now with lethargy. History of stroke. EXAM: CT HEAD WITHOUT CONTRAST CT CERVICAL SPINE WITHOUT CONTRAST TECHNIQUE: Multidetector CT imaging of the head and cervical spine was performed following the standard protocol without intravenous contrast. Multiplanar CT image reconstructions of the cervical spine were also generated. COMPARISON:  Head CT - 09/03/2017; head and C-spine CT-06/16/2017 FINDINGS: CT HEAD FINDINGS Brain: Moderately advanced atrophy with sulcal prominence and centralized volume loss with commensurate ex vacuo dilatation the ventricular system. Periventricular hypodensities compatible microvascular ischemic disease. Given background parenchymal abnormalities, there is no CT evidence superimposed acute large territory infarct. No intraparenchymal or extra-axial mass or hemorrhage. Unchanged size and configuration of the ventricles and the basilar cisterns. No midline shift. Vascular: Intracranial atherosclerosis. Skull: No displaced calvarial fracture. Sinuses/Orbits: Limited visualization of the paranasal sinuses and mastoid air cells is normal. No air-fluid levels. A small amount of debris is seen within the bilateral external auditory canals. Post left-sided cataract surgery. Other: Regional soft tissues appear normal. CT CERVICAL SPINE FINDINGS Alignment: C1 to the superior endplate of T2 is imaged. Mild scoliotic curvature of the cervicothoracic spine, potentially positional.  Otherwise, normal alignment of the cervical spine. No anterolisthesis or retrolisthesis. Bilateral facets are normally aligned. Skull base and vertebrae: The dens is normally positioned between the lateral masses of C1. Mild degenerative change of the atlantodental articulation. Normal atlantoaxial articulations. Cervical vertebral body heights are preserved. Old fracture involving the T1 spinous process (sagittal image 18, series 10) Soft tissues and spinal canal: Prevertebral soft tissues are normal. Disc levels: Cervical intervertebral disc space heights appear preserved. Upper chest: Limited visualization of lung apices demonstrates centrilobular emphysematous change. Other: Regional soft tissues appear normal. Calcified atherosclerotic plaque within the bilateral carotid bulbs. Normal noncontrast appearance of the thyroid gland. No bulky cervical lymphadenopathy on this noncontrast examination. IMPRESSION: 1. Similar findings of atrophy and microvascular ischemic disease without superimposed acute intracranial process. 2. No acute fracture or static subluxation of the cervical spine. 3. Old T1 spinous process fracture. Electronically Signed   By: Sandi Mariscal M.D.   On: 09/17/2017 13:25   Dg Chest Port 1 View  Result Date: 10/14/2017 CLINICAL DATA:  67 year old male with shortness of breath EXAM: PORTABLE CHEST 1 VIEW COMPARISON:  Chest radiograph dated 10/11/2017 and CT dated 10/11/2017 FINDINGS: Bilateral lower lobe airspace densities may represent atelectasis versus infiltrate, new since the radiograph of 10/11/2017. No pleural effusion or pneumothorax. Top-normal cardiac size. No acute osseous pathology. IMPRESSION: Bibasilar  atelectasis/infiltrate. Electronically Signed   By: Anner Crete M.D.   On: 10/14/2017 21:09   Dg Chest Portable 1 View  Result Date: 10/11/2017 CLINICAL DATA:  67 year old male with hypotension. Bilateral lower extremity edema. EXAM: PORTABLE CHEST 1 VIEW COMPARISON:   09/17/2017 and earlier chest radiographs. FINDINGS: Portable AP semi upright view at 1436 hours. The patient is more rotated to the right. Mildly improved lung volumes and bibasilar ventilation. No pulmonary edema, pneumothorax or consolidation. No definite pleural effusion. Stable cardiac size and mediastinal contours. Cardiac size within normal limits. Visualized tracheal air column is within normal limits. Negative visible bowel gas pattern. No acute osseous abnormality identified. IMPRESSION: No acute cardiopulmonary abnormality. Electronically Signed   By: Genevie Ann M.D.   On: 10/11/2017 14:49   Dg Chest Portable 1 View  Result Date: 09/17/2017 CLINICAL DATA:  Hypoxemia; Per ED notes, last night around 9-10p pt fell and denies LOC; Hx CVA with R sided deficits; pt lethargic and weak; Hx COPD, sarcoidosis, DM, and HTN. Smoker EXAM: PORTABLE CHEST 1 VIEW COMPARISON:  09/03/2017 FINDINGS: Shallow lung inflation. Heart size is accentuated by portable AP technique. There is stable minimal atelectasis or scarring at the RIGHT lung base. No pulmonary edema. Degenerative changes are seen in thoracic spine. No acute fracture. IMPRESSION: RIGHT LOWER lobe atelectasis. Electronically Signed   By: Nolon Nations M.D.   On: 09/17/2017 12:53     Phillips Climes M.D on 10/15/2017 at 2:48 PM  Between 7am to 7pm - Pager - [phone]  After 7pm go to [email or web address] - password [facility]  [facility] -  Office  [phone]

## 2017-10-15 NOTE — Progress Notes (Signed)
Attempted Powerglide, Midline/ Extended Dwell catheter insertion. Assessed both upper arms with [facility] ultrasound. Unable to find suitable vein for Midline placement in upper arms. Left upper arm bruised from previous peripheral IV. Attempted Powerglide Extended Dwell Catheter insertion LAFA. Obtained excellent blood return,but unable to thread guidewire/catheter. Attempted RAFA unsuccessful. Patients RN to notify MD

## 2017-10-16 DIAGNOSIS — J13 Pneumonia due to Streptococcus pneumoniae: Secondary | ICD-10-CM

## 2017-10-16 LAB — CBC
HEMATOCRIT: 38.8 % — AB (ref 39.0–52.0)
Hemoglobin: 12.2 g/dL — ABNORMAL LOW (ref 13.0–17.0)
MCH: 28.4 pg (ref 26.0–34.0)
MCHC: 31.4 g/dL (ref 30.0–36.0)
MCV: 90.2 fL (ref 78.0–100.0)
Platelets: 168 10*3/uL (ref 150–400)
RBC: 4.3 MIL/uL (ref 4.22–5.81)
RDW: 20.8 % — AB (ref 11.5–15.5)
WBC: 6 10*3/uL (ref 4.0–10.5)

## 2017-10-16 LAB — BASIC METABOLIC PANEL
ANION GAP: 7 (ref 5–15)
BUN: 13 mg/dL (ref 8–23)
CALCIUM: 8.7 mg/dL — AB (ref 8.9–10.3)
CO2: 30 mmol/L (ref 22–32)
Chloride: 105 mmol/L (ref 98–111)
Creatinine, Ser: 0.99 mg/dL (ref 0.61–1.24)
GFR calc Af Amer: >60 mL/min (ref >60)
GFR calc non Af Amer: >60 mL/min (ref >60)
GLUCOSE: 91 mg/dL (ref 70–99)
POTASSIUM: 3.5 mmol/L (ref 3.5–5.1)
Sodium: 142 mmol/L (ref 135–145)

## 2017-10-16 MED ORDER — TORSEMIDE 20 MG PO TABS
20.0000 mg | ORAL_TABLET | Freq: Once | ORAL | Status: AC
  Administered 2017-10-16: 20 mg via ORAL
  Filled 2017-10-16: qty 1

## 2017-10-16 MED ORDER — POTASSIUM CHLORIDE CRYS ER 20 MEQ PO TBCR
40.0000 meq | EXTENDED_RELEASE_TABLET | Freq: Once | ORAL | Status: AC
  Administered 2017-10-16: 40 meq via ORAL
  Filled 2017-10-16: qty 2

## 2017-10-16 MED ORDER — SODIUM CHLORIDE 0.9% FLUSH: 10.0000 mL | INTRAVENOUS | Status: DC | PRN

## 2017-10-16 MED ORDER — SODIUM CHLORIDE 0.9% FLUSH: 10.0000 mL | Freq: Two times a day (BID) | INTRAVENOUS | Status: DC

## 2017-10-16 MED ORDER — SODIUM CHLORIDE 0.9 % IV SOLN
1.0000 g | Freq: Three times a day (TID) | INTRAVENOUS | Status: DC
  Administered 2017-10-16 – 2017-10-18 (×7): 1 g via INTRAVENOUS
  Filled 2017-10-16 (×9): qty 1

## 2017-10-16 NOTE — Progress Notes (Signed)
PROGRESS NOTE                                                                                                                                                                                                             Patient Demographics:    Patrick Holt, is a 67 y.o. male, DOB - 03-Dec-1950, HYQ:[id number]  Admit date - 10/11/2017   Admitting Physician Elwyn Reach, MD  Outpatient Primary MD for the patient is [facility]  LOS - 5   Chief Complaint  Patient presents with  . Hypotension       Brief Narrative    Patrick Holt is a 67 y.o. male with medical history significant of multiple comorbidities including atrial fibrillation, diastolic dysfunction CHF, COPD, conversion disorder, asthma, right heart failure, chronic lower extremity stasis dermatitis who presented to the ER with generalized weakness, shortness of breath, cough as well as hypotension, work-up significant for acute PE, a day of discharge 10/14/2017, patient with significant hypoxemia requiring nonrebreather, chest x-ray significant for pneumonia.   Subjective:    Patrick Holt today has, No headache, No chest pain, No abdominal pain , patient with significant hypoxemia overnight, requiring nonrebreather, currently back on 4 L nasal cannula , patient reports some cough, currently productive with yellow phlegm   Assessment  & Plan :    Principal Problem:   Pulmonary embolism (HCC) Active Problems:   DEPRESSION, MAJOR   Tobacco abuse   COPD (chronic obstructive pulmonary disease) (HCC)   GERD   History of seizure disorder   Essential hypertension, benign   Hypernatremia   PAF (paroxysmal atrial fibrillation) (HCC)   ARF (acute renal failure) ([place])   Anasarca   acute saddle pulmonary embolus: -as Evident on CTA chest, patient has sedentary lifestyle, 2D echo with no evidence of right heart strain, he had negative troponins, doing well on  3 L nasal cannula, initially on heparin GTT, transitioned to Eliquis.  Acute hypoxic/hypoxemic respiratory failure -Patient on room air at baseline, since admission he is on 3 L, he had an episode on 10/14/2017 eveningof acute decompensation, with hypoxia and hypoxemia as well, requiring nonrebreather bag, improved during the day, chest x-ray significant for pneumonia. -Poor respiratory effort is major concern, despite encouraging multiple times to use incentive spirometry and flutter valve, he remains with poor respiratory  effort, he was encouraged again today.  HCAP -His x-ray yesterday evening significant for up pneumonia, giving his multiple allergies he will be continued on Vanco and aztreonam.  He was hard axis, did not receive any antibiotics till today after PICC line insertion, encouraged again to use incentive spirometry and flutter valve, requested RT to perform frequent NTS.  paroxysmal atrial fibrillation: -Currently on sinus rhythm, rate controlled, on amiodarone, now started on anticoagulation for PE.  anasarca:  -Secondary to hypoalbuminemia, right heart failure from PE, improving, will give as needed torsemide today  AKI on CKD -peaked at 1.6 , appears to be improving, avoid nephrotoxic medications.  COPD:  - No acute exacerbation.  Empiric nebulizer treatments.  tobacco abuse:  - Counseling provided.  Will offer nicotine patch.  GERD: - Continue with PPIs  seizure disorder:  - Continue with Keppra and Depakote.  essential hypertension:   - Continue with home regimen and monitor blood pressure.   Code Status : Full code  Family Communication  : none at bedside  Disposition Plan  : Need SNF placement, probably if continues to improve will be stable for discharge in 48 hours  Consults  :  None  Procedures  : None  DVT Prophylaxis  :  Heparin GTT>> Eliquis  Lab Results  Component Value Date   PLT 168 10/16/2017    Antibiotics  :     Anti-infectives (From admission, onward)   Start     Dose/Rate Route Frequency Ordered Stop   10/16/17 1400  aztreonam (AZACTAM) 1 g in sodium chloride 0.9 % 100 mL IVPB     1 g 200 mL/hr over 30 Minutes Intravenous Every 8 hours 10/16/17 1254     10/15/17 2330  vancomycin (VANCOCIN) 1,250 mg in sodium chloride 0.9 % 250 mL IVPB     1,250 mg 62.5 mL/hr over 240 Minutes Intravenous Every 12 hours 10/15/17 1026     10/15/17 2200  aztreonam (AZACTAM) injection 2 g  Status:  Discontinued     2 g Intramuscular Every 8 hours 10/15/17 1700 10/16/17 1253   10/15/17 1300  aztreonam (AZACTAM) injection 2 g  Status:  Discontinued     2 g Intramuscular Every 8 hours 10/15/17 1026 10/15/17 1659   10/15/17 1130  vancomycin (VANCOCIN) 2,000 mg in sodium chloride 0.9 % 500 mL IVPB  Status:  Discontinued     2,000 mg 125 mL/hr over 240 Minutes Intravenous  Once 10/15/17 1026 10/16/17 1133        Objective:   Vitals:   10/15/17 1940 10/15/17 2121 10/16/17 0515 10/16/17 0805  BP: 112/62  103/70 114/69  Pulse: 79 75 83 80  Resp: 18 18 19 18   Temp: 98.2 F (36.8 C)  98.8 F (37.1 C) 98.7 F (37.1 C)  TempSrc: Oral  Oral Oral  SpO2: 91% 92% (!) 87% 90%  Weight:      Height:        Wt Readings from Last 3 Encounters:  10/11/17 97.2 kg  09/07/17 105.7 kg  06/16/17 86.2 kg     Intake/Output Summary (Last 24 hours) at 10/16/2017 1416 Last data filed at 10/16/2017 1031 Gross per 24 hour  Intake 720 ml  Output 1850 ml  Net -1130 ml     Physical Exam  Awake Alert, Oriented X 3, well, chronically ill-appearing, sitting in recliner in no apparent distress Symmetrical Chest wall movement, diminished at the bases, bibasilar Rales Irregular irregular,No Gallops,Rubs or new Murmurs, No Parasternal Heave +ve  B.Sounds, Abd Soft, No tenderness, No rebound - guarding or rigidity. Right lower extremity edema than left     Data Review:    CBC Recent Labs  Lab 10/11/17 1454  10/12/17 0521  10/13/17 0424 10/14/17 0458 10/15/17 0708 10/16/17 0650  WBC 6.7   < > 6.0 5.9 5.6 6.5 6.0  HGB 12.6*   < > 11.9* 11.7* 11.3* 11.6* 12.2*  HCT 41.5   < > 38.9* 37.8* 36.2* 37.8* 38.8*  PLT 200   < > 178 182 175 177 168  MCV 92.8   < > 92.4 90.9 90.3 90.6 90.2  MCH 28.2   < > 28.3 28.1 28.2 27.8 28.4  MCHC 30.4   < > 30.6 31.0 31.2 30.7 31.4  RDW 21.5*   < > 21.2* 21.0* 20.6* 20.8* 20.8*  LYMPHSABS 2.0  --   --   --   --   --   --   MONOABS 0.7  --   --   --   --   --   --   EOSABS 0.1  --   --   --   --   --   --   BASOSABS 0.0  --   --   --   --   --   --    < > = values in this interval not displayed.    Chemistries  Recent Labs  Lab 10/11/17 1454 10/12/17 0521 10/13/17 0424 10/14/17 0458 10/16/17 0650  NA 146* 144 143 141 142  K 3.9 3.9 3.9 3.9 3.5  CL 109 110 109 107 105  CO2 29 24 29 29 30   GLUCOSE 101* 76 119* 97 91  BUN 14 14 8  7* 13  CREATININE 1.30* 1.61* 1.06 0.95 0.99  CALCIUM 9.0 8.5* 8.4* 8.5* 8.7*  MG 2.0  --   --   --   --   AST 11* 11*  --   --   --   ALT 11 10  --   --   --   ALKPHOS 65 56  --   --   --   BILITOT 1.1 0.7  --   --   --    ------------------------------------------------------------------------------------------------------------------ No results for input(s): CHOL, HDL, LDLCALC, TRIG, CHOLHDL, LDLDIRECT in the last 72 hours.  Lab Results  Component Value Date   HGBA1C 5.9 (H) 01/14/2017   ------------------------------------------------------------------------------------------------------------------ No results for input(s): TSH, T4TOTAL, T3FREE, THYROIDAB in the last 72 hours.  Invalid input(s): FREET3 ------------------------------------------------------------------------------------------------------------------ No results for input(s): VITAMINB12, FOLATE, FERRITIN, TIBC, IRON, RETICCTPCT in the last 72 hours.  Coagulation profile No results for input(s): INR, PROTIME in the last 168 hours.  No results for input(s):  DDIMER in the last 72 hours.  Cardiac Enzymes Recent Labs  Lab 10/12/17 0834 10/12/17 1518 10/15/17 0708  TROPONINI <0.03 <0.03 <0.03   ------------------------------------------------------------------------------------------------------------------    Component Value Date/Time   BNP 20.8 10/11/2017 1454    Inpatient Medications  Scheduled Meds: . ALPRAZolam  0.5 mg Oral TID  . amiodarone  200 mg Oral Daily  . apixaban  10 mg Oral BID   Followed by  . [START ON 10/21/2017] apixaban  5 mg Oral BID  . atorvastatin  40 mg Oral Daily  . divalproex  250 mg Oral TID  . guaiFENesin  1,200 mg Oral BID  . levETIRAcetam  500 mg Oral BID  . mometasone-formoterol  2 puff Inhalation BID  . multivitamin with minerals  1 tablet Oral Daily  .  nicotine  21 mg Transdermal Daily  . oxybutynin  5 mg Oral TID  . pantoprazole  40 mg Oral Daily  . potassium chloride  20 mEq Oral QODAY  . potassium chloride  40 mEq Oral Once  . risperiDONE  1 mg Oral BID  . sodium chloride flush  10-40 mL Intracatheter Q12H  . sodium chloride flush  3 mL Intravenous Q12H  . torsemide  20 mg Oral Once  . Vitamin D (Ergocalciferol)  50,000 Units Oral Weekly   Continuous Infusions: . sodium chloride    . aztreonam    . sodium chloride    . vancomycin 1,250 mg (10/16/17 0942)   PRN Meds:.sodium chloride, ipratropium-albuterol, loperamide, ondansetron **OR** ondansetron (ZOFRAN) IV, polyvinyl alcohol, senna-docusate, sodium chloride flush, sodium chloride flush  Micro Results Recent Results (from the past 240 hour(s))  Urine culture     Status: Abnormal   Collection Time: 10/11/17 10:38 PM  Result Value Ref Range Status   Specimen Description URINE, CATHETERIZED  Final   Special Requests NONE  Final   Culture (A)  Final    <10,000 COLONIES/mL INSIGNIFICANT GROWTH Performed at [facility], [address], [place], [place] [address]    Report Status 10/13/2017 FINAL  Final    Radiology  Reports Ct Head Wo Contrast  Result Date: 09/17/2017 CLINICAL DATA:  Patient fell last evening, now with lethargy. History of stroke. EXAM: CT HEAD WITHOUT CONTRAST CT CERVICAL SPINE WITHOUT CONTRAST TECHNIQUE: Multidetector CT imaging of the head and cervical spine was performed following the standard protocol without intravenous contrast. Multiplanar CT image reconstructions of the cervical spine were also generated. COMPARISON:  Head CT - 09/03/2017; head and C-spine CT-06/16/2017 FINDINGS: CT HEAD FINDINGS Brain: Moderately advanced atrophy with sulcal prominence and centralized volume loss with commensurate ex vacuo dilatation the ventricular system. Periventricular hypodensities compatible microvascular ischemic disease. Given background parenchymal abnormalities, there is no CT evidence superimposed acute large territory infarct. No intraparenchymal or extra-axial mass or hemorrhage. Unchanged size and configuration of the ventricles and the basilar cisterns. No midline shift. Vascular: Intracranial atherosclerosis. Skull: No displaced calvarial fracture. Sinuses/Orbits: Limited visualization of the paranasal sinuses and mastoid air cells is normal. No air-fluid levels. A small amount of debris is seen within the bilateral external auditory canals. Post left-sided cataract surgery. Other: Regional soft tissues appear normal. CT CERVICAL SPINE FINDINGS Alignment: C1 to the superior endplate of T2 is imaged. Mild scoliotic curvature of the cervicothoracic spine, potentially positional. Otherwise, normal alignment of the cervical spine. No anterolisthesis or retrolisthesis. Bilateral facets are normally aligned. Skull base and vertebrae: The dens is normally positioned between the lateral masses of C1. Mild degenerative change of the atlantodental articulation. Normal atlantoaxial articulations. Cervical vertebral body heights are preserved. Old fracture involving the T1 spinous process (sagittal image 18,  series 10) Soft tissues and spinal canal: Prevertebral soft tissues are normal. Disc levels: Cervical intervertebral disc space heights appear preserved. Upper chest: Limited visualization of lung apices demonstrates centrilobular emphysematous change. Other: Regional soft tissues appear normal. Calcified atherosclerotic plaque within the bilateral carotid bulbs. Normal noncontrast appearance of the thyroid gland. No bulky cervical lymphadenopathy on this noncontrast examination. IMPRESSION: 1. Similar findings of atrophy and microvascular ischemic disease without superimposed acute intracranial process. 2. No acute fracture or static subluxation of the cervical spine. 3. Old T1 spinous process fracture. Electronically Signed   By: Sandi Mariscal M.D.   On: 09/17/2017 13:25   Ct Angio Chest Pe W And/or  Wo Contrast  Result Date: 10/11/2017 CLINICAL DATA:  Cough and shortness of breath EXAM: CT ANGIOGRAPHY CHEST WITH CONTRAST TECHNIQUE: Multidetector CT imaging of the chest was performed using the standard protocol during bolus administration of intravenous contrast. Multiplanar CT image reconstructions and MIPs were obtained to evaluate the vascular anatomy. CONTRAST:  169mL ISOVUE-370 IOPAMIDOL (ISOVUE-370) INJECTION 76% COMPARISON:  CTA chest 04/06/2017 FINDINGS: Cardiovascular: --Pulmonary arteries: Contrast injection is sufficient to demonstrate satisfactory opacification of the pulmonary arteries to the segmental level.There is a saddle pulmonary embolus that extends along both the right and left pulmonary arteries and into the right upper lobar, inter lobar and left lower lobar arteries. Emboli extend into proximal segmental branches of all right lobes and the left lower lobe. The main pulmonary artery is within normal limits for size. No evidence of right heart strain. --Aorta: Limited opacification of the aorta due to bolus timing optimization for the pulmonary arteries. Conventional 3 vessel aortic  branching pattern. The aortic course and caliber are normal. There is mild aortic atherosclerosis. --Heart: Normal size. No pericardial effusion. Mediastinum/Nodes: No mediastinal, hilar or axillary lymphadenopathy. The visualized thyroid and thoracic esophageal course are unremarkable. Lungs/Pleura: There is bibasilar atelectasis. No pleural effusion or pneumothorax. No evidence of acute pulmonary infarction. Upper Abdomen: Contrast bolus timing is not optimized for evaluation of the abdominal organs. Within this limitation, the visualized organs of the upper abdomen are normal. Musculoskeletal: There is extravasated contrast in the visualized right arm extending into the right axilla. Review of the MIP images confirms the above findings. IMPRESSION: 1. Acute saddle pulmonary embolus with emboli extending into the left lower lobe are artery and multiple left lower lobe segmental branches and into all right-sided lobar branches and multiple proximal right segmental branches. 2. No CT evidence of right heart strain. 3. Extravasated contrast material within the right arm and axilla. Critical Value/emergent results were called by telephone at the time of interpretation on 10/11/2017 at 7:42 pm to Dr. Tomi Bamberger, who verbally acknowledged these results. Aortic Atherosclerosis (ICD10-I70.0). Electronically Signed   By: Ulyses Jarred M.D.   On: 10/11/2017 19:43   Ct Cervical Spine Wo Contrast  Result Date: 09/17/2017 CLINICAL DATA:  Patient fell last evening, now with lethargy. History of stroke. EXAM: CT HEAD WITHOUT CONTRAST CT CERVICAL SPINE WITHOUT CONTRAST TECHNIQUE: Multidetector CT imaging of the head and cervical spine was performed following the standard protocol without intravenous contrast. Multiplanar CT image reconstructions of the cervical spine were also generated. COMPARISON:  Head CT - 09/03/2017; head and C-spine CT-06/16/2017 FINDINGS: CT HEAD FINDINGS Brain: Moderately advanced atrophy with sulcal  prominence and centralized volume loss with commensurate ex vacuo dilatation the ventricular system. Periventricular hypodensities compatible microvascular ischemic disease. Given background parenchymal abnormalities, there is no CT evidence superimposed acute large territory infarct. No intraparenchymal or extra-axial mass or hemorrhage. Unchanged size and configuration of the ventricles and the basilar cisterns. No midline shift. Vascular: Intracranial atherosclerosis. Skull: No displaced calvarial fracture. Sinuses/Orbits: Limited visualization of the paranasal sinuses and mastoid air cells is normal. No air-fluid levels. A small amount of debris is seen within the bilateral external auditory canals. Post left-sided cataract surgery. Other: Regional soft tissues appear normal. CT CERVICAL SPINE FINDINGS Alignment: C1 to the superior endplate of T2 is imaged. Mild scoliotic curvature of the cervicothoracic spine, potentially positional. Otherwise, normal alignment of the cervical spine. No anterolisthesis or retrolisthesis. Bilateral facets are normally aligned. Skull base and vertebrae: The dens is normally positioned between the lateral masses of  C1. Mild degenerative change of the atlantodental articulation. Normal atlantoaxial articulations. Cervical vertebral body heights are preserved. Old fracture involving the T1 spinous process (sagittal image 18, series 10) Soft tissues and spinal canal: Prevertebral soft tissues are normal. Disc levels: Cervical intervertebral disc space heights appear preserved. Upper chest: Limited visualization of lung apices demonstrates centrilobular emphysematous change. Other: Regional soft tissues appear normal. Calcified atherosclerotic plaque within the bilateral carotid bulbs. Normal noncontrast appearance of the thyroid gland. No bulky cervical lymphadenopathy on this noncontrast examination. IMPRESSION: 1. Similar findings of atrophy and microvascular ischemic disease  without superimposed acute intracranial process. 2. No acute fracture or static subluxation of the cervical spine. 3. Old T1 spinous process fracture. Electronically Signed   By: Sandi Mariscal M.D.   On: 09/17/2017 13:25   Dg Chest Port 1 View  Result Date: 10/14/2017 CLINICAL DATA:  67 year old male with shortness of breath EXAM: PORTABLE CHEST 1 VIEW COMPARISON:  Chest radiograph dated 10/11/2017 and CT dated 10/11/2017 FINDINGS: Bilateral lower lobe airspace densities may represent atelectasis versus infiltrate, new since the radiograph of 10/11/2017. No pleural effusion or pneumothorax. Top-normal cardiac size. No acute osseous pathology. IMPRESSION: Bibasilar atelectasis/infiltrate. Electronically Signed   By: Anner Crete M.D.   On: 10/14/2017 21:09   Dg Chest Portable 1 View  Result Date: 10/11/2017 CLINICAL DATA:  67 year old male with hypotension. Bilateral lower extremity edema. EXAM: PORTABLE CHEST 1 VIEW COMPARISON:  09/17/2017 and earlier chest radiographs. FINDINGS: Portable AP semi upright view at 1436 hours. The patient is more rotated to the right. Mildly improved lung volumes and bibasilar ventilation. No pulmonary edema, pneumothorax or consolidation. No definite pleural effusion. Stable cardiac size and mediastinal contours. Cardiac size within normal limits. Visualized tracheal air column is within normal limits. Negative visible bowel gas pattern. No acute osseous abnormality identified. IMPRESSION: No acute cardiopulmonary abnormality. Electronically Signed   By: Genevie Ann M.D.   On: 10/11/2017 14:49   Dg Chest Portable 1 View  Result Date: 09/17/2017 CLINICAL DATA:  Hypoxemia; Per ED notes, last night around 9-10p pt fell and denies LOC; Hx CVA with R sided deficits; pt lethargic and weak; Hx COPD, sarcoidosis, DM, and HTN. Smoker EXAM: PORTABLE CHEST 1 VIEW COMPARISON:  09/03/2017 FINDINGS: Shallow lung inflation. Heart size is accentuated by portable AP technique. There is stable  minimal atelectasis or scarring at the RIGHT lung base. No pulmonary edema. Degenerative changes are seen in thoracic spine. No acute fracture. IMPRESSION: RIGHT LOWER lobe atelectasis. Electronically Signed   By: Nolon Nations M.D.   On: 09/17/2017 12:53   [place] Ekg Site Rite  Result Date: 10/15/2017 If Site Rite image not attached, placement could not be confirmed due to current cardiac rhythm.    Phillips Climes M.D on 10/16/2017 at 2:16 PM  Between 7am to 7pm - Pager - [phone]  After 7pm go to [email or web address] - password [facility]  [facility] -  Office  [phone]

## 2017-10-16 NOTE — Progress Notes (Signed)
RT called to bedside to NT suction pt per MD order. Pt has congested cough and unable to bring up secretions. Pt NT suctioned through R nare x 2 attempts with moderate yellow/white thick secretions removed. Pt in no distress. No increased WOB, VS within normal limits. RT will continue to monitor.

## 2017-10-16 NOTE — Progress Notes (Signed)
Nasopharyngeal suctioning done by RT. Patient tolerated well. Will continue to monitor.   Farley Ly RN

## 2017-10-16 NOTE — Progress Notes (Signed)
Peripherally Inserted Central Catheter/Midline Placement  The IV Nurse has discussed with the patient and/or persons authorized to consent for the patient, the purpose of this procedure and the potential benefits and risks involved with this procedure.  The benefits include less needle sticks, lab draws from the catheter, and the patient may be discharged home with the catheter. Risks include, but not limited to, infection, bleeding, blood clot (thrombus formation), and puncture of an artery; nerve damage and irregular heartbeat and possibility to perform a PICC exchange if needed/ordered by physician.  Alternatives to this procedure were also discussed.  Bard Power PICC patient education guide, fact sheet on infection prevention and patient information card has been provided to patient /or left at bedside.    PICC/Midline Placement Documentation  PICC Single Lumen 25/42/70 PICC Right Basilic 42 cm 1 cm (Active)  Indication for Insertion or Continuance of Line Limited venous access - need for IV therapy >5 days (PICC only);Prolonged intravenous therapies;Poor Vasculature-patient has had multiple peripheral attempts or PIVs lasting less than 24 hours 10/16/2017  8:06 AM  Exposed Catheter (cm) 1 cm 10/16/2017  8:06 AM  Site Assessment Clean;Dry;Intact 10/16/2017  8:06 AM  Line Status Flushed;Saline locked;Blood return noted 10/16/2017  8:06 AM  Dressing Type Transparent 10/16/2017  8:06 AM  Dressing Status Clean;Dry;Intact;Antimicrobial disc in place 10/16/2017  8:06 AM  Line Care Connections checked and tightened 10/16/2017  8:06 AM  Line Adjustment (NICU/IV Team Only) No 10/16/2017  8:06 AM  Dressing Intervention New dressing 10/16/2017  8:06 AM  Dressing Change Due 10/23/17 10/16/2017  8:06 AM       Rolena Infante 10/16/2017, 8:07 AM

## 2017-10-17 DIAGNOSIS — J189 Pneumonia, unspecified organism: Secondary | ICD-10-CM

## 2017-10-17 LAB — BASIC METABOLIC PANEL
Anion gap: 6 (ref 5–15)
BUN: 11 mg/dL (ref 8–23)
CALCIUM: 8.7 mg/dL — AB (ref 8.9–10.3)
CO2: 35 mmol/L — ABNORMAL HIGH (ref 22–32)
Chloride: 101 mmol/L (ref 98–111)
Creatinine, Ser: 0.99 mg/dL (ref 0.61–1.24)
GFR calc Af Amer: >60 mL/min (ref >60)
GFR calc non Af Amer: >60 mL/min (ref >60)
GLUCOSE: 84 mg/dL (ref 70–99)
Potassium: 3.5 mmol/L (ref 3.5–5.1)
Sodium: 142 mmol/L (ref 135–145)

## 2017-10-17 LAB — CBC
HCT: 40.2 % (ref 39.0–52.0)
Hemoglobin: 12.5 g/dL — ABNORMAL LOW (ref 13.0–17.0)
MCH: 28.5 pg (ref 26.0–34.0)
MCHC: 31.1 g/dL (ref 30.0–36.0)
MCV: 91.6 fL (ref 78.0–100.0)
Platelets: 178 10*3/uL (ref 150–400)
RBC: 4.39 MIL/uL (ref 4.22–5.81)
RDW: 20.5 % — AB (ref 11.5–15.5)
WBC: 5.9 10*3/uL (ref 4.0–10.5)

## 2017-10-17 MED ORDER — TORSEMIDE 20 MG PO TABS
20.0000 mg | ORAL_TABLET | Freq: Every day | ORAL | Status: DC
  Administered 2017-10-18: 20 mg via ORAL
  Filled 2017-10-17: qty 1

## 2017-10-17 NOTE — Progress Notes (Signed)
PROGRESS NOTE                                                                                                                                                                                                             Patient Demographics:    Patrick Holt, is a 67 y.o. male, DOB - 09/13/1950, DJS:[id number]  Admit date - 10/11/2017   Admitting Physician Elwyn Reach, MD  Outpatient Primary MD for the patient is [facility]  LOS - 6   Chief Complaint  Patient presents with  . Hypotension       Brief Narrative    Patrick Holt is a 67 y.o. male with medical history significant of multiple comorbidities including atrial fibrillation, diastolic dysfunction CHF, COPD, conversion disorder, asthma, right heart failure, chronic lower extremity stasis dermatitis who presented to the ER with generalized weakness, shortness of breath, cough as well as hypotension, work-up significant for acute PE, a day of discharge 10/14/2017, patient with significant hypoxemia requiring nonrebreather, chest x-ray significant for pneumonia.   Subjective:    [name] today denies any chest pain, shortness of breath, reports is feeling better, his cough has improved as well .   Assessment  & Plan :    Principal Problem:   Pulmonary embolism (HCC) Active Problems:   DEPRESSION, MAJOR   Tobacco abuse   COPD (chronic obstructive pulmonary disease) (HCC)   GERD   History of seizure disorder   Essential hypertension, benign   Hypernatremia   PAF (paroxysmal atrial fibrillation) (HCC)   ARF (acute renal failure) ([place])   Anasarca   acute saddle pulmonary embolus. -as Evident on CTA chest, patient has sedentary lifestyle, 2D echo with no evidence of right heart strain, he had negative troponins, doing well on 3 L nasal cannula, initially on heparin GTT, transitioned to Eliquis.  Acute hypoxic/hypoxemic respiratory  failure. -Patient on room air at baseline, since admission he is on 3 L, he had an episode on 10/14/2017 evening of acute decompensation, with hypoxia and hypoxemia as well, requiring nonrebreather bag, improved during the day, chest x-ray significant for pneumonia. -He is on oxygen, but there is work of breathing and respiratory efforts appears to be improving, he is encouraged again to use incentive spirometry and flutter valve.  HCAP -Chest x-ray significant for up pneumonia, giving  his multiple allergies he will be continued on Vanco and aztreonam.  Encouraged again to use incentive spirometry and flutter valve today, appears to be improving, hopefully can be transition to p.o. regimen tomorrow.  paroxysmal atrial fibrillation: -Currently on sinus rhythm, rate controlled, on amiodarone, now started on anticoagulation for PE.  anasarca:  -Secondary to hypoalbuminemia, right heart failure from PE, improving, received torsemide yesterday, renal function remained stable, will give another dose today.  AKI on CKD -peaked at 1.6 , appears to be improving, avoid nephrotoxic medications.  COPD:  - No acute exacerbation.  Empiric nebulizer treatments.  tobacco abuse:  - Counseling provided.  Will offer nicotine patch.  GERD: - Continue with PPIs  seizure disorder:  - Continue with Keppra and Depakote.  essential hypertension:   - Continue with home regimen and monitor blood pressure.   Code Status : Full code  Family Communication  : none at bedside  Disposition Plan  : Need SNF placement, probably if continues to improve will be stable for discharge in 48 hours  Consults  :  None  Procedures  : None  DVT Prophylaxis  :  Heparin GTT>> Eliquis  Lab Results  Component Value Date   PLT 178 10/17/2017    Antibiotics  :    Anti-infectives (From admission, onward)   Start     Dose/Rate Route Frequency Ordered Stop   10/16/17 1400  aztreonam (AZACTAM) 1 g in sodium  chloride 0.9 % 100 mL IVPB     1 g 200 mL/hr over 30 Minutes Intravenous Every 8 hours 10/16/17 1254     10/15/17 2330  vancomycin (VANCOCIN) 1,250 mg in sodium chloride 0.9 % 250 mL IVPB     1,250 mg 62.5 mL/hr over 240 Minutes Intravenous Every 12 hours 10/15/17 1026     10/15/17 2200  aztreonam (AZACTAM) injection 2 g  Status:  Discontinued     2 g Intramuscular Every 8 hours 10/15/17 1700 10/16/17 1253   10/15/17 1300  aztreonam (AZACTAM) injection 2 g  Status:  Discontinued     2 g Intramuscular Every 8 hours 10/15/17 1026 10/15/17 1659   10/15/17 1130  vancomycin (VANCOCIN) 2,000 mg in sodium chloride 0.9 % 500 mL IVPB  Status:  Discontinued     2,000 mg 125 mL/hr over 240 Minutes Intravenous  Once 10/15/17 1026 10/16/17 1133        Objective:   Vitals:   10/17/17 0522 10/17/17 0604 10/17/17 0759 10/17/17 0912  BP: 100/69   102/64  Pulse: 82   80  Resp: 14   20  Temp: 97.9 F (36.6 C)   98.2 F (36.8 C)  TempSrc: Oral   Oral  SpO2: (!) 86% 90% 92% 94%  Weight:      Height:        Wt Readings from Last 3 Encounters:  10/11/17 97.2 kg  09/07/17 105.7 kg  06/16/17 86.2 kg     Intake/Output Summary (Last 24 hours) at 10/17/2017 1337 Last data filed at 10/17/2017 1000 Gross per 24 hour  Intake 1334.24 ml  Output 3200 ml  Net -1865.76 ml     Physical Exam   Awake Alert, Oriented X 3, laying in bed, in no apparent distress Symmetrical Chest wall movement, proved air entry at the bases, but still with some rales RRR,No Gallops,Rubs or new Murmurs, No Parasternal Heave +ve B.Sounds, Abd Soft, No tenderness, No rebound - guarding or rigidity. No Cyanosis, Clubbing, edema is improving  Data Review:    CBC Recent Labs  Lab 10/11/17 1454  10/13/17 0424 10/14/17 0458 10/15/17 0708 10/16/17 0650 10/17/17 0335  WBC 6.7   < > 5.9 5.6 6.5 6.0 5.9  HGB 12.6*   < > 11.7* 11.3* 11.6* 12.2* 12.5*  HCT 41.5   < > 37.8* 36.2* 37.8* 38.8* 40.2  PLT 200   < >  182 175 177 168 178  MCV 92.8   < > 90.9 90.3 90.6 90.2 91.6  MCH 28.2   < > 28.1 28.2 27.8 28.4 28.5  MCHC 30.4   < > 31.0 31.2 30.7 31.4 31.1  RDW 21.5*   < > 21.0* 20.6* 20.8* 20.8* 20.5*  LYMPHSABS 2.0  --   --   --   --   --   --   MONOABS 0.7  --   --   --   --   --   --   EOSABS 0.1  --   --   --   --   --   --   BASOSABS 0.0  --   --   --   --   --   --    < > = values in this interval not displayed.    Chemistries  Recent Labs  Lab 10/11/17 1454 10/12/17 0521 10/13/17 0424 10/14/17 0458 10/16/17 0650 10/17/17 0335  NA 146* 144 143 141 142 142  K 3.9 3.9 3.9 3.9 3.5 3.5  CL 109 110 109 107 105 101  CO2 29 24 29 29 30  35*  GLUCOSE 101* 76 119* 97 91 84  BUN 14 14 8  7* 13 11  CREATININE 1.30* 1.61* 1.06 0.95 0.99 0.99  CALCIUM 9.0 8.5* 8.4* 8.5* 8.7* 8.7*  MG 2.0  --   --   --   --   --   AST 11* 11*  --   --   --   --   ALT 11 10  --   --   --   --   ALKPHOS 65 56  --   --   --   --   BILITOT 1.1 0.7  --   --   --   --    ------------------------------------------------------------------------------------------------------------------ No results for input(s): CHOL, HDL, LDLCALC, TRIG, CHOLHDL, LDLDIRECT in the last 72 hours.  Lab Results  Component Value Date   HGBA1C 5.9 (H) 01/14/2017   ------------------------------------------------------------------------------------------------------------------ No results for input(s): TSH, T4TOTAL, T3FREE, THYROIDAB in the last 72 hours.  Invalid input(s): FREET3 ------------------------------------------------------------------------------------------------------------------ No results for input(s): VITAMINB12, FOLATE, FERRITIN, TIBC, IRON, RETICCTPCT in the last 72 hours.  Coagulation profile No results for input(s): INR, PROTIME in the last 168 hours.  No results for input(s): DDIMER in the last 72 hours.  Cardiac Enzymes Recent Labs  Lab 10/12/17 0834 10/12/17 1518 10/15/17 0708  TROPONINI <0.03 <0.03  <0.03   ------------------------------------------------------------------------------------------------------------------    Component Value Date/Time   BNP 20.8 10/11/2017 1454    Inpatient Medications  Scheduled Meds: . ALPRAZolam  0.5 mg Oral TID  . amiodarone  200 mg Oral Daily  . apixaban  10 mg Oral BID   Followed by  . [START ON 10/21/2017] apixaban  5 mg Oral BID  . atorvastatin  40 mg Oral Daily  . divalproex  250 mg Oral TID  . guaiFENesin  1,200 mg Oral BID  . levETIRAcetam  500 mg Oral BID  . mometasone-formoterol  2 puff Inhalation BID  . multivitamin with minerals  1 tablet Oral Daily  . nicotine  21 mg Transdermal Daily  . oxybutynin  5 mg Oral TID  . pantoprazole  40 mg Oral Daily  . potassium chloride  20 mEq Oral QODAY  . risperiDONE  1 mg Oral BID  . sodium chloride flush  10-40 mL Intracatheter Q12H  . sodium chloride flush  3 mL Intravenous Q12H  . Vitamin D (Ergocalciferol)  50,000 Units Oral Weekly   Continuous Infusions: . sodium chloride    . aztreonam 1 g (10/17/17 0606)  . sodium chloride    . vancomycin 1,250 mg (10/17/17 1024)   PRN Meds:.sodium chloride, ipratropium-albuterol, loperamide, ondansetron **OR** ondansetron (ZOFRAN) IV, polyvinyl alcohol, senna-docusate, sodium chloride flush, sodium chloride flush  Micro Results Recent Results (from the past 240 hour(s))  Urine culture     Status: Abnormal   Collection Time: 10/11/17 10:38 PM  Result Value Ref Range Status   Specimen Description URINE, CATHETERIZED  Final   Special Requests NONE  Final   Culture (A)  Final    <10,000 COLONIES/mL INSIGNIFICANT GROWTH Performed at [facility], [address], [place],  [address]    Report Status 10/13/2017 FINAL  Final    Radiology Reports Ct Angio Chest Pe W And/or Wo Contrast  Result Date: 10/11/2017 CLINICAL DATA:  Cough and shortness of breath EXAM: CT ANGIOGRAPHY CHEST WITH CONTRAST TECHNIQUE: Multidetector CT imaging  of the chest was performed using the standard protocol during bolus administration of intravenous contrast. Multiplanar CT image reconstructions and MIPs were obtained to evaluate the vascular anatomy. CONTRAST:  199mL ISOVUE-370 IOPAMIDOL (ISOVUE-370) INJECTION 76% COMPARISON:  CTA chest 04/06/2017 FINDINGS: Cardiovascular: --Pulmonary arteries: Contrast injection is sufficient to demonstrate satisfactory opacification of the pulmonary arteries to the segmental level.There is a saddle pulmonary embolus that extends along both the right and left pulmonary arteries and into the right upper lobar, inter lobar and left lower lobar arteries. Emboli extend into proximal segmental branches of all right lobes and the left lower lobe. The main pulmonary artery is within normal limits for size. No evidence of right heart strain. --Aorta: Limited opacification of the aorta due to bolus timing optimization for the pulmonary arteries. Conventional 3 vessel aortic branching pattern. The aortic course and caliber are normal. There is mild aortic atherosclerosis. --Heart: Normal size. No pericardial effusion. Mediastinum/Nodes: No mediastinal, hilar or axillary lymphadenopathy. The visualized thyroid and thoracic esophageal course are unremarkable. Lungs/Pleura: There is bibasilar atelectasis. No pleural effusion or pneumothorax. No evidence of acute pulmonary infarction. Upper Abdomen: Contrast bolus timing is not optimized for evaluation of the abdominal organs. Within this limitation, the visualized organs of the upper abdomen are normal. Musculoskeletal: There is extravasated contrast in the visualized right arm extending into the right axilla. Review of the MIP images confirms the above findings. IMPRESSION: 1. Acute saddle pulmonary embolus with emboli extending into the left lower lobe are artery and multiple left lower lobe segmental branches and into all right-sided lobar branches and multiple proximal right segmental  branches. 2. No CT evidence of right heart strain. 3. Extravasated contrast material within the right arm and axilla. Critical Value/emergent results were called by telephone at the time of interpretation on 10/11/2017 at 7:42 pm to Dr. Tomi Bamberger, who verbally acknowledged these results. Aortic Atherosclerosis (ICD10-I70.0). Electronically Signed   By: Ulyses Jarred M.D.   On: 10/11/2017 19:43   Dg Chest Port 1 View  Result Date: 10/14/2017 CLINICAL DATA:  67 year old male with shortness of breath  EXAM: PORTABLE CHEST 1 VIEW COMPARISON:  Chest radiograph dated 10/11/2017 and CT dated 10/11/2017 FINDINGS: Bilateral lower lobe airspace densities may represent atelectasis versus infiltrate, new since the radiograph of 10/11/2017. No pleural effusion or pneumothorax. Top-normal cardiac size. No acute osseous pathology. IMPRESSION: Bibasilar atelectasis/infiltrate. Electronically Signed   By: Anner Crete M.D.   On: 10/14/2017 21:09   Dg Chest Portable 1 View  Result Date: 10/11/2017 CLINICAL DATA:  67 year old male with hypotension. Bilateral lower extremity edema. EXAM: PORTABLE CHEST 1 VIEW COMPARISON:  09/17/2017 and earlier chest radiographs. FINDINGS: Portable AP semi upright view at 1436 hours. The patient is more rotated to the right. Mildly improved lung volumes and bibasilar ventilation. No pulmonary edema, pneumothorax or consolidation. No definite pleural effusion. Stable cardiac size and mediastinal contours. Cardiac size within normal limits. Visualized tracheal air column is within normal limits. Negative visible bowel gas pattern. No acute osseous abnormality identified. IMPRESSION: No acute cardiopulmonary abnormality. Electronically Signed   By: Genevie Ann M.D.   On: 10/11/2017 14:49   [place] Ekg Site Rite  Result Date: 10/15/2017 If Site Rite image not attached, placement could not be confirmed due to current cardiac rhythm.    Phillips Climes M.D on 10/17/2017 at 1:37 PM  Between 7am to 7pm -  Pager - [phone]  After 7pm go to [email or web address] - password [facility]  [facility] -  Office  [phone]

## 2017-10-18 LAB — CBC
HCT: 37.8 % — ABNORMAL LOW (ref 39.0–52.0)
Hemoglobin: 11.5 g/dL — ABNORMAL LOW (ref 13.0–17.0)
MCH: 28 pg (ref 26.0–34.0)
MCHC: 30.4 g/dL (ref 30.0–36.0)
MCV: 92.2 fL (ref 78.0–100.0)
PLATELETS: 183 10*3/uL (ref 150–400)
RBC: 4.1 MIL/uL — ABNORMAL LOW (ref 4.22–5.81)
RDW: 20.1 % — AB (ref 11.5–15.5)
WBC: 5.5 10*3/uL (ref 4.0–10.5)

## 2017-10-18 MED ORDER — DOXYCYCLINE HYCLATE 100 MG PO TABS: 100.0000 mg | ORAL_TABLET | Freq: Two times a day (BID) | ORAL | 0 refills | Status: AC

## 2017-10-18 MED ORDER — DOXYCYCLINE HYCLATE 100 MG PO TABS: 100.0000 mg | ORAL_TABLET | Freq: Two times a day (BID) | ORAL | Status: DC

## 2017-10-18 MED ORDER — APIXABAN 5 MG PO TABS: ORAL_TABLET | ORAL | Status: AC

## 2017-10-18 MED ORDER — DOXYCYCLINE HYCLATE 100 MG PO TABS
100.0000 mg | ORAL_TABLET | Freq: Two times a day (BID) | ORAL | Status: DC
  Administered 2017-10-18: 100 mg via ORAL
  Filled 2017-10-18: qty 1

## 2017-10-18 MED ORDER — GUAIFENESIN ER 600 MG PO TB12: 1200.0000 mg | ORAL_TABLET | Freq: Two times a day (BID) | ORAL | Status: AC

## 2017-10-18 MED ORDER — PREDNISONE 10 MG PO TABS: 5.0000 mg | ORAL_TABLET | Freq: Every day | ORAL | Status: AC

## 2017-10-18 MED ORDER — CHLORHEXIDINE GLUCONATE CLOTH 2 % EX PADS: 6.0000 | MEDICATED_PAD | Freq: Every day | CUTANEOUS | Status: DC

## 2017-10-18 NOTE — Discharge Summary (Signed)
Patrick Holt, is a 67 y.o. male  DOB 09/05/1950  MRN [id number].  Admission date:  10/11/2017  Admitting Physician  Elwyn Reach, MD  Discharge Date:  10/18/2017   Primary MD  Medicine, [facility]  Recommendations for primary care physician for things to follow:  - please  check CBC, BMP within 3 days -Patient is on 3 L nasal cannula, wean as tolerated, continue to encourage using incentive spirometry and flutter valve, as well patient will need NTS (nasotracheal suction) on PRN basis.   Admission Diagnosis  Shortness of breath [R06.02] Cough [R05] Peripheral edema [R60.9] Acute saddle pulmonary embolism with acute cor pulmonale (HCC) [I26.02] Fatigue, unspecified type [R53.83]   Discharge Diagnosis  Shortness of breath [R06.02] Cough [R05] Peripheral edema [R60.9] Acute saddle pulmonary embolism with acute cor pulmonale (HCC) [I26.02] Fatigue, unspecified type [R53.83]    Principal Problem:   Pulmonary embolism (HCC) Active Problems:   DEPRESSION, MAJOR   Tobacco abuse   COPD (chronic obstructive pulmonary disease) (HCC)   GERD   History of seizure disorder   Essential hypertension, benign   Hypernatremia   PAF (paroxysmal atrial fibrillation) (HCC)   ARF (acute renal failure) (HCC)   Anasarca      Past Medical History:  Diagnosis Date  . Anemia   . Anxiety   . Asthma   . Atrial fibrillation ([place]) 01/2012   with RVR  . Bipolar 1 disorder ([place])   . Cancer ([place])   . Collagen vascular disease ([place])   . Conversion disorder 06/2010  . COPD (chronic obstructive pulmonary disease) ([place])   . Dermatomyositis ([place])   . Dermatomyositis ([place])   . Diverticulitis   . Diverticulosis of colon   . Dysrhythmia    "irregular" (11/15/2012)  . Esophageal dysmotility   . Esophageal stricture   . Fibromyalgia   . Gastritis   . GERD (gastroesophageal reflux disease)   .  Hand fracture, right    sept 13, 2017, still wearing splint as og 01-10-2017  . Headache(784.0)    "severe; get them often" (11/15/2012)  . Hiatal hernia   . History of narcotic addiction ([place])   . Hx of adenomatous colonic polyps   . Hyperlipidemia   . Hypertension   . Internal hemorrhoids   . Ischemic heart disease   . Major depression    with acute psychotic break in 06/2010  . Myocardial infarct ([place])    mulitple (1999, 2000, 2004)  . Narcotic dependence ([place])   . Nephrolithiasis   . Obesity   . OCD (obsessive compulsive disorder)   . Otosclerosis   . Paroxysmal A-fib ([place])   . Peripheral neuropathy   . Raynaud's disease   . Renal insufficiency   . Rheumatoid arthritis(714.0)   . Sarcoidosis   . Seizures ([place])   . Subarachnoid hemorrhage ([place]) 01/2012   with subdural  hematoma.   . Type II diabetes mellitus ([place])   . Urge incontinence   . Vertigo     Past Surgical  History:  Procedure Laterality Date  . BACK SURGERY    . CARDIAC CATHETERIZATION    . CARPAL TUNNEL RELEASE Bilateral   . CATARACT EXTRACTION W/ INTRAOCULAR LENS IMPLANT Left   . COLONOSCOPY N/A 09/27/2012   Procedure: COLONOSCOPY;  Surgeon: Lafayette Dragon, MD;  Location: [facility];  Service: Endoscopy;  Laterality: N/A;  . ESOPHAGOGASTRODUODENOSCOPY N/A 09/27/2012   Procedure: ESOPHAGOGASTRODUODENOSCOPY (EGD);  Surgeon: Lafayette Dragon, MD;  Location: [facility];  Service: Endoscopy;  Laterality: N/A;  . KNEE ARTHROSCOPY W/ MENISCAL REPAIR Left 2009  . LUMBAR DISC SURGERY    . LYMPH NODE DISSECTION Right 1970's   "neck; dr thought I had Hodgkins; turned out to be sarcoidosis" (11/15/2012)  . squamous papilloma   2010   removed by Dr. Constance Holster ENT, noted on tongue  . TONSILLECTOMY         History of present illness and  Hospital Course:     Kindly see H&P for history of present illness and admission details, please review complete Labs, Consult reports and Test reports for all details in brief  HPI   from the history and physical done on the day of admission  AJO:INOMVE W Overcashis a 67 y.o.malewith medical history significant ofmultiple comorbidities including atrial fibrillation, diastolic dysfunction CHF, COPD, conversion disorder, asthma, right heart failure, chronic lower extremity stasis dermatitis who presented to the ER with generalized weakness, shortness of breath, cough as well as hypotension. Patient was visibly fluid overloaded but blood pressure was in the 80s on arrival. He has bilateral lower extremity edema as well. He denied any chest pain. Denied any diaphoresis. His shortness of breath is above his typical limits. He has some cough but nonproductive. Patient was seen and evaluated in the ER with a picture of anasarca but evidence of right heart failure in the setting of hypotension and palpitation. He was further evaluated and found to have findings consistent with pulmonary embolism hence he is being admitted for work-up and treatment.  ED Course:She has vitals include temperature of 98,BP is 154/93 pulse is 76, respiratory rates 22 and Sats 94% and wbc 6.7, hb of 12.6, plts 200, Sodium is 146, potassium of 3.9,creatinine is 1.30 and glucose of 101. Lactic acid 0.80 CT angiogram of the chest confirms pulmonary embolism, it showsAcute saddle pulmonary embolus with emboli extending into the left lower lobe are artery and multiple left lower lobe segmental branches and into all right-sided lobar branches and multiple proximal right segmental branches.Bilateral lower extremity Doppler ultrasound showed no evidence of DVT  Hospital Course   Sylis Ketchum Overcashis a 67 y.o.malewith medical history significant ofmultiple comorbidities including atrial fibrillation, diastolic dysfunction CHF, COPD, conversion disorder, asthma, right heart failure, chronic lower extremity stasis dermatitis who presented to the ER with generalized weakness, shortness of breath, cough as  well as hypotension, work-up significant for acute PE, a day of discharge 10/14/2017, patient with significant hypoxemia requiring nonrebreather, chest x-ray significant for pneumonia.  acute saddle pulmonary embolus. -as Evident on CTA chest, patient has sedentary lifestyle, 2D echo with no evidence of right heart strain, he had negative troponins, doing well on 3 L nasal cannula, initially on heparin GTT, transitioned to Eliquis.  Acute hypoxic/hypoxemic respiratory failure. -Patient on room air at baseline, since admission he is on 3 L, he had an episode on 10/14/2017 evening of acute decompensation, with hypoxia and hypoxemia as well, requiring nonrebreather bag, improved during the day, chest x-ray significant for pneumonia. -He is on oxygen, but there  is work of breathing and respiratory efforts appears to be improving, he is encouraged again to use incentive spirometry and flutter valve.  Moderate while it is of great importance here is improvement, it needs to be emphasized to continue using his flutter valve, incentive spirometry, as well he will need NTS as needed  HCAP -Chest x-ray significant for up pneumonia, giving his multiple allergies he was treated with vancomycin and aztreonam during hospital stay, will be discharged another 4 days of oral doxycycline .   paroxysmal atrial fibrillation: -Currently on sinus rhythm, rate controlled, on amiodarone, now started on anticoagulation for PE.  anasarca: -Secondary to hypoalbuminemia, right heart failure from PE, improving, he received couple doses of oral torsemide during hospital stay, with significant improvement of volume status  AKI on CKD -peaked at 1.6 , appears to be improving, avoid nephrotoxic medications.  COPD: - No acute exacerbation. Empiric nebulizer treatments.  tobacco abuse: - Counseling provided. nicotine patch.  GERD: -Continue with PPIs  seizure disorder:  - Continue with Keppra and  Depakote.  essential hypertension:  - Continue with home regimen and monitor blood pressure.  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   Service:  Skilled Nursing Contact information: [place] [phone]                Discharge Instructions  and  Discharge Medications     Discharge Instructions    Discharge instructions   Complete by:  As directed    Follow with Primary MD Medicine, [facility], or SNF physician in 3 days.  Get CBC, CMP, 2 view Chest X ray checked  by Primary MD next visit.    Activity: As tolerated with Full fall precautions use walker/cane & assistance as needed   Disposition SNF   Diet: Heart Healthy  , with feeding assistance and aspiration precautions.  For Heart failure patients - Check your Weight same time everyday, if you gain over 2 pounds, or you develop in leg swelling, experience more shortness of breath or chest pain, call your Primary MD immediately. Follow Cardiac Low Salt Diet and 1.5 lit/day fluid restriction.   On your next visit with your primary care physician please Get Medicines reviewed and adjusted.   Please request your Prim.MD to go over all Hospital Tests and Procedure/Radiological results at the follow up, please get all Hospital records sent to your Prim MD by signing hospital release before you go home.   If you experience worsening of your admission symptoms, develop shortness of breath, life threatening emergency, suicidal or homicidal thoughts you must seek medical attention immediately by calling 911 or calling your MD immediately  if symptoms less severe.  You Must  read complete instructions/literature along with all the possible adverse reactions/side effects for all the Medicines you take and that have been prescribed to you. Take any new Medicines after you have completely understood and accpet all the possible adverse reactions/side effects.   Do not drive, operating heavy machinery, perform activities at heights, swimming or participation in water activities or provide baby sitting services if your were admitted for syncope or siezures until you have  seen by Primary MD or a Neurologist and advised to do so again.  Do not drive when taking Pain medications.    Do not take more than prescribed Pain, Sleep and Anxiety Medications  Special Instructions: If you have smoked or chewed Tobacco  in the last 2 yrs please stop smoking, stop any regular Alcohol  and or any Recreational drug use.  Wear Seat belts while driving.   Please note  You were cared for by a hospitalist during your hospital stay. If you have any questions about your discharge medications or the care you received while you were in the hospital after you are discharged, you can call the unit and asked to speak with the hospitalist on call if the hospitalist that took care of you is not available. Once you are discharged, your primary care physician will handle any further medical issues. Please note that NO REFILLS for any discharge medications will be authorized once you are discharged, as it is imperative that you return to your primary care physician (or establish a relationship with a primary care physician if you do not have one) for your aftercare needs so that they can reassess your need for medications and monitor your lab values.   Increase activity slowly   Complete by:  As directed      Allergies as of 10/18/2017      Reactions   Immune Globulins Other (See Comments)   Acute renal failure   Rho (d) Immune Globulin    Acute renal failure Acute renal failure   Ciprofloxacin  Swelling   Flagyl [metronidazole] Swelling   Lisinopril Diarrhea   Sulfa Antibiotics Other (See Comments)   blisters   Sulfasalazine    blisters blisters   Cefepime Other (See Comments)   Diffuse maculopapular rash when receiving both vanc and cefepime, timing unclear.   Morphine And Related Other (See Comments)   Ineffective for pain management   Requip [ropinirole Hcl] Other (See Comments)   constipation   Vancomycin    Diffuse maculopapular rash when receiving both vanc/cefepime.  Timing unclear.     Betamethasone Dipropionate Other (See Comments)   Unknown   Betamethasone Dipropionate    Unknown   Bupropion    Unknown   Bupropion Hcl Other (See Comments)   Unknown   Clobetasol Other (See Comments)   Unknown Unknown   Codeine Other (See Comments)   Does not help with pain   Escitalopram Oxalate Other (See Comments)   Unknown   Escitalopram Oxalate    Unknown   Fluoxetine Hcl Other (See Comments)   Unknown   Fluoxetine Hcl    Unknown   Furosemide Other (See Comments)   Unknown Unknown   Meclizine Rash   Paroxetine Other (See Comments)   Unknown   Penicillins Other (See Comments)   Unknown reaction Has patient had a PCN reaction causing immediate rash, facial/tongue/throat swelling, SOB or lightheadedness with hypotension: unknown Has patient had a PCN reaction causing severe rash involving mucus membranes or skin necrosis: unknown Has patient had a PCN reaction that required hospitalization unknown Has patient had a PCN reaction occurring within the last 10 years: unknown If all of the above answers are "NO", then may proceed with Cephalosporin use. Unknown Unknown reactio   Tacrolimus Other (See Comments)   Unknown   Tetanus Toxoid Other (See Comments)   Unknown   Tuberculin Purified Protein Derivative Other (See Comments)   Unknown      Medication List  STOP taking these medications   aspirin EC 81 MG tablet     TAKE these medications    ALPRAZolam 0.5 MG tablet Commonly known as:  XANAX Take 1 tablet (0.5 mg total) by mouth 3 (three) times daily.   amiodarone 200 MG tablet Commonly known as:  PACERONE Take 1 tablet (200 mg total) by mouth daily.   apixaban 5 MG Tabs tablet Commonly known as:  ELIQUIS Take 10 mg oral twice daily for total of  days, then transition to 5 mg oral twice daily on 10/21/2017   atorvastatin 40 MG tablet Commonly known as:  LIPITOR Take 1 tablet (40 mg total) by mouth daily.   budesonide-formoterol 160-4.5 MCG/ACT inhaler Commonly known as:  SYMBICORT Inhale 2 puffs into the lungs 2 (two) times daily.   divalproex 250 MG DR tablet Commonly known as:  DEPAKOTE Take 1 tablet (250 mg total) by mouth 3 (three) times daily.   doxycycline 100 MG tablet Commonly known as:  VIBRA-TABS Take 1 tablet (100 mg total) by mouth 2 (two) times daily for 4 days. Take for 4 days then stop   ergocalciferol 50000 units capsule Commonly known as:  VITAMIN D2 Take 1 capsule (50,000 Units total) by mouth once a week.   esomeprazole 40 MG capsule Commonly known as:  NEXIUM Take 1 capsule (40 mg total) by mouth daily.   guaiFENesin 600 MG 12 hr tablet Commonly known as:  MUCINEX Take 2 tablets (1,200 mg total) by mouth 2 (two) times daily.   ipratropium-albuterol 0.5-2.5 (3) MG/3ML Soln Commonly known as:  DUONEB Inhale 3 mLs into the lungs every 6 (six) hours as needed.   levETIRAcetam 500 MG tablet Commonly known as:  KEPPRA Take 1 tablet (500 mg total) by mouth 2 (two) times daily.   loperamide 2 MG capsule Commonly known as:  IMODIUM Take 1 capsule (2 mg total) by mouth as needed for diarrhea or loose stools.   multivitamin with minerals Tabs tablet Take 1 tablet by mouth daily.   nicotine 21 mg/24hr patch Commonly known as:  NICODERM CQ - dosed in mg/24 hours Place 1 patch (21 mg total) onto the skin daily.   oxybutynin 5 MG tablet Commonly known as:  DITROPAN Take 1 tablet (5 mg  total) by mouth 3 (three) times daily.   Potassium Chloride ER 20 MEQ Tbcr Take 20 mEq by mouth every other day.   predniSONE 10 MG tablet Commonly known as:  DELTASONE Take 0.5 tablets (5 mg total) by mouth daily with breakfast. What changed:  how much to take   risperiDONE 1 MG tablet Commonly known as:  RISPERDAL Take 1 tablet (1 mg total) by mouth 2 (two) times daily.   senna-docusate 8.6-50 MG tablet Commonly known as:  Senokot-S Take 1 tablet by mouth at bedtime as needed for mild constipation.         Diet and Activity recommendation: See Discharge Instructions above   Consults obtained -  none   Major procedures and Radiology Reports - PLEASE review detailed and final reports for all details, in brief -      Ct Angio Chest Pe W And/or Wo Contrast  Result Date: 10/11/2017 CLINICAL DATA:  Cough and shortness of breath EXAM: CT ANGIOGRAPHY CHEST WITH CONTRAST TECHNIQUE: Multidetector CT imaging of the chest was performed using the standard protocol during bolus administration of intravenous contrast. Multiplanar CT image reconstructions and MIPs were obtained to evaluate the vascular anatomy. CONTRAST:  165mL ISOVUE-370 IOPAMIDOL (ISOVUE-370)  INJECTION 76% COMPARISON:  CTA chest 04/06/2017 FINDINGS: Cardiovascular: --Pulmonary arteries: Contrast injection is sufficient to demonstrate satisfactory opacification of the pulmonary arteries to the segmental level.There is a saddle pulmonary embolus that extends along both the right and left pulmonary arteries and into the right upper lobar, inter lobar and left lower lobar arteries. Emboli extend into proximal segmental branches of all right lobes and the left lower lobe. The main pulmonary artery is within normal limits for size. No evidence of right heart strain. --Aorta: Limited opacification of the aorta due to bolus timing optimization for the pulmonary arteries. Conventional 3 vessel aortic branching pattern. The aortic  course and caliber are normal. There is mild aortic atherosclerosis. --Heart: Normal size. No pericardial effusion. Mediastinum/Nodes: No mediastinal, hilar or axillary lymphadenopathy. The visualized thyroid and thoracic esophageal course are unremarkable. Lungs/Pleura: There is bibasilar atelectasis. No pleural effusion or pneumothorax. No evidence of acute pulmonary infarction. Upper Abdomen: Contrast bolus timing is not optimized for evaluation of the abdominal organs. Within this limitation, the visualized organs of the upper abdomen are normal. Musculoskeletal: There is extravasated contrast in the visualized right arm extending into the right axilla. Review of the MIP images confirms the above findings. IMPRESSION: 1. Acute saddle pulmonary embolus with emboli extending into the left lower lobe are artery and multiple left lower lobe segmental branches and into all right-sided lobar branches and multiple proximal right segmental branches. 2. No CT evidence of right heart strain. 3. Extravasated contrast material within the right arm and axilla. Critical Value/emergent results were called by telephone at the time of interpretation on 10/11/2017 at 7:42 pm to Dr. Tomi Bamberger, who verbally acknowledged these results. Aortic Atherosclerosis (ICD10-I70.0). Electronically Signed   By: Ulyses Jarred M.D.   On: 10/11/2017 19:43   Dg Chest Port 1 View  Result Date: 10/14/2017 CLINICAL DATA:  67 year old male with shortness of breath EXAM: PORTABLE CHEST 1 VIEW COMPARISON:  Chest radiograph dated 10/11/2017 and CT dated 10/11/2017 FINDINGS: Bilateral lower lobe airspace densities may represent atelectasis versus infiltrate, new since the radiograph of 10/11/2017. No pleural effusion or pneumothorax. Top-normal cardiac size. No acute osseous pathology. IMPRESSION: Bibasilar atelectasis/infiltrate. Electronically Signed   By: Anner Crete M.D.   On: 10/14/2017 21:09   Dg Chest Portable 1 View  Result Date:  10/11/2017 CLINICAL DATA:  67 year old male with hypotension. Bilateral lower extremity edema. EXAM: PORTABLE CHEST 1 VIEW COMPARISON:  09/17/2017 and earlier chest radiographs. FINDINGS: Portable AP semi upright view at 1436 hours. The patient is more rotated to the right. Mildly improved lung volumes and bibasilar ventilation. No pulmonary edema, pneumothorax or consolidation. No definite pleural effusion. Stable cardiac size and mediastinal contours. Cardiac size within normal limits. Visualized tracheal air column is within normal limits. Negative visible bowel gas pattern. No acute osseous abnormality identified. IMPRESSION: No acute cardiopulmonary abnormality. Electronically Signed   By: Genevie Ann M.D.   On: 10/11/2017 14:49   [place] Ekg Site Rite  Result Date: 10/15/2017 If Site Rite image not attached, placement could not be confirmed due to current cardiac rhythm.   Micro Results     Recent Results (from the past 240 hour(s))  Urine culture     Status: Abnormal   Collection Time: 10/11/17 10:38 PM  Result Value Ref Range Status   Specimen Description URINE, CATHETERIZED  Final   Special Requests NONE  Final   Culture (A)  Final    <10,000 COLONIES/mL INSIGNIFICANT GROWTH Performed at [facility], [address], [place], [place] [address]    Report Status 10/13/2017 FINAL  Final       Today   Subjective:   Mohamedamin Nifong today has no headache,no chest or  abdominal pain, reports some cough, improving, currently less productive .   Objective:   Blood pressure 98/64, pulse 72, temperature 98 F (36.7 C), temperature source Oral, resp. rate 18, height 5\' 7"  (1.702 m), weight 97.2 kg, SpO2 92 %.   Intake/Output Summary (Last 24 hours) at 10/18/2017 1434 Last data filed at 10/18/2017 1000 Gross per 24 hour  Intake 1292.19 ml  Output 675 ml  Net 617.19 ml    Exam Awake Alert, Oriented x 3, frail, chronically ill-appearing no new F.N deficits, Normal  affect Symmetrical Chest wall movement, diminished at the bases, upper airway rales RRR,No Gallops,Rubs or new Murmurs, No Parasternal Heave +ve B.Sounds, Abd Soft, Non tender, No organomegaly appriciated, No rebound -guarding or rigidity. No Cyanosis, Clubbing, edema has improved  Data Review   CBC w Diff:  Lab Results  Component Value Date   WBC 5.5 10/18/2017   HGB 11.5 (L) 10/18/2017   HCT 37.8 (L) 10/18/2017   PLT 183 10/18/2017   LYMPHOPCT 30 10/11/2017   MONOPCT 10 10/11/2017   EOSPCT 1 10/11/2017   BASOPCT 0 10/11/2017    CMP:  Lab Results  Component Value Date   NA 142 10/17/2017   K 3.5 10/17/2017   CL 101 10/17/2017   CO2 35 (H) 10/17/2017   BUN 11 10/17/2017   CREATININE 0.99 10/17/2017   CREATININE 0.97 05/19/2015   PROT 5.1 (L) 10/12/2017   ALBUMIN 2.5 (L) 10/12/2017   BILITOT 0.7 10/12/2017   ALKPHOS 56 10/12/2017   AST 11 (L) 10/12/2017   ALT 10 10/12/2017  .   Total Time in preparing paper work, data evaluation and todays exam - 24 minutes  Phillips Climes M.D on 10/18/2017 at 2:34 PM  [facility]   Office  [phone]

## 2017-10-18 NOTE — Progress Notes (Signed)
Physical Therapy Treatment Patient Details Name: Patrick Holt MRN: [id number] DOB: 16-Jun-1950 Today's Date: 10/18/2017    History of Present Illness Pt is a 67 y.o. M with significant PMH of multiple comorbidities including atrial fibrillation, diastolic dysfunction CHF, COPD, conversion disorder, asthma, right hear failure, chronic lower extremitys tasis dermatitis who presented to ER with generalized weakness, shortness of breath, cough, work-up significant for acute PE; on 8/30, episodes of acute decompensation, with hypoxia and hypoxemia as well, requiring nonrebreather bag; chest x-ray significant for pneumonia    PT Comments    Continuing work on functional mobility and activity tolerance; Slow, but notable progress towards goals; session conducted on room air, and his O2 sats ranged from 86% to 94%; he did not need supplemental O2 to incr his sats back to low 90s; cues for deep breathing   Follow Up Recommendations  SNF;Supervision/Assistance - 24 hour;Supervision for mobility/OOB     Equipment Recommendations  Hospital bed    Recommendations for Other Services       Precautions / Restrictions Precautions Precautions: Fall Precaution Comments: multiple falls    Mobility  Bed Mobility Overal bed mobility: Needs Assistance Bed Mobility: Supine to Sit     Supine to sit: Mod assist     General bed mobility comments: Assist and use of bed pad to scoot hips closer to EOB; Mod assist to elelvate trunk to sit, then mod assist to help scoot hips to EOB and square of seated at EOB  Transfers Overall transfer level: Needs assistance Equipment used: Rolling walker (2 wheeled) Transfers: Sit to/from Stand Sit to Stand: Mod assist;+2 safety/equipment;+2 physical assistance         General transfer comment: Light mod assist to power up to stand; slow moving  Ambulation/Gait Ambulation/Gait assistance: Mod assist;+2 physical assistance Gait Distance (Feet): 5 Feet(bed to  chair) Assistive device: Rolling walker (2 wheeled) Gait Pattern/deviations: Step-to pattern;Wide base of support;Decreased step length - right;Decreased step length - left;Decreased stride length     General Gait Details: Mod assist to help maneuver RW; gentle tactile cueing to facilitated weight shift into stance and allow for stepping with advancing LE; very slow steps; cues fo rupright posture   Stairs             Wheelchair Mobility    Modified Rankin (Stroke Patients Only)       Balance     Sitting balance-Leahy Scale: Fair       Standing balance-Leahy Scale: Poor Standing balance comment: relies on BUE support                            Cognition Arousal/Alertness: Awake/alert Behavior During Therapy: WFL for tasks assessed/performed Overall Cognitive Status: No family/caregiver present to determine baseline cognitive functioning                               Problem Solving: Slow processing;Decreased initiation;Difficulty sequencing General Comments: pt reports STM deficits at baseline      Exercises      General Comments        Pertinent Vitals/Pain Pain Assessment: Faces Faces Pain Scale: Hurts a little bit Pain Location: Grimace with movement; pt did not give a specific location of pain Pain Descriptors / Indicators: Grimacing Pain Intervention(s): Monitored during session    Home Living  Prior Function            PT Goals (current goals can now be found in the care plan section) Acute Rehab PT Goals Patient Stated Goal: Did not state, but agreeable to getting OOB PT Goal Formulation: With patient Time For Goal Achievement: 10/27/17 Potential to Achieve Goals: Fair Progress towards PT goals: Progressing toward goals(slowly)    Frequency    Min 2X/week      PT Plan Current plan remains appropriate    Co-evaluation              AM-PAC PT "6 Clicks" Daily Activity   Outcome Measure  Difficulty turning over in bed (including adjusting bedclothes, sheets and blankets)?: A Lot Difficulty moving from lying on back to sitting on the side of the bed? : Unable Difficulty sitting down on and standing up from a chair with arms (e.g., wheelchair, bedside commode, etc,.)?: Unable Help needed moving to and from a bed to chair (including a wheelchair)?: A Lot Help needed walking in hospital room?: A Lot Help needed climbing 3-5 steps with a railing? : Total 6 Click Score: 9    End of Session Equipment Utilized During Treatment: Gait belt Activity Tolerance: Patient limited by fatigue Patient left: in chair;with call bell/phone within reach;with chair alarm set Nurse Communication: Mobility status;Other (comment)(and O2 sat range on room air) PT Visit Diagnosis: Unsteadiness on feet (R26.81);Repeated falls (R29.6);Other abnormalities of gait and mobility (R26.89);History of falling (Z91.81);Muscle weakness (generalized) (M62.81);Difficulty in walking, not elsewhere classified (R26.2);Other symptoms and signs involving the nervous system (R29.898)     Time: 0354-6568 PT Time Calculation (min) (ACUTE ONLY): 27 min  Charges:  $Gait Training: 8-22 mins $Therapeutic Activity: 8-22 mins                     Roney Marion, PT  Acute Rehabilitation Services Pager [phone] Office [phone]    Colletta [place] 10/18/2017, 1:11 PM

## 2017-10-18 NOTE — Clinical Social Work Note (Addendum)
Patient discharging to [facility] today, transported by ambulance. Admissions director informed of patient's readiness for discharge and discharge clinicals transmitted to facility. Patient asleep when visited room and attempted to reach patient's friend, [name] ([phone]) by phone and VM not set-up. CSW signing off as no other SW intervention services needed at this time.  Lexys Milliner Givens, MSW, LCSW Licensed Clinical Social Worker [place] [phone]

## 2017-10-18 NOTE — Progress Notes (Addendum)
1915 Bedside shift report, pt sleeping, arouses to verbal stimulation. NAD, no complaints. Pt falling back to sleep easily. Fall precautions in place, [facility].   2000 Pt still sleeping, easy to arouse, assessed, see flow sheet, turned and repositioned. Updated with POC. Pt denies pain, SOB. Will attempt to wean O2.   2200 Pt medicated per MAR. Pills given in banana pudding. Pt turned and repositioned, tolerated well. Fall precautions in place, [facility].   0100 CCMD called, pt's O2 on RA was 87%. 2L [place] applied, O2 in the high 90s.   0400 Pt's O2 to low 80s on 2L [place], RN placed pt on 3L [place], O2 sats in the 90s.

## 2017-10-19 NOTE — Progress Notes (Unsigned)
CSW received call from Patrick Holt with Social Services. Patrick Holt sought further information on where pt was discharged to as social services is involved with pt. CSW updated Patrick Holt that per notes pt was discharged to [facility] on yesterday. No further information given to [place] at this time.   [name] [name], MSW, [facility] Clinical Social Worker [phone]

## 2017-10-31 ENCOUNTER — Encounter (HOSPITAL_COMMUNITY): Payer: Self-pay | Admitting: *Deleted

## 2017-10-31 ENCOUNTER — Inpatient Hospital Stay (HOSPITAL_COMMUNITY)
Admission: EM | Admit: 2017-10-31 | Discharge: 2017-11-10 | DRG: 871 | Disposition: A | Discharge status: Home Health | Payer: Medicare Other | Attending: Internal Medicine | Admitting: Internal Medicine

## 2017-10-31 ENCOUNTER — Other Ambulatory Visit: Payer: Self-pay

## 2017-10-31 ENCOUNTER — Emergency Department (HOSPITAL_COMMUNITY): Payer: Medicare Other

## 2017-10-31 DIAGNOSIS — I639 Cerebral infarction, unspecified: Secondary | ICD-10-CM

## 2017-10-31 DIAGNOSIS — I252 Old myocardial infarction: Secondary | ICD-10-CM

## 2017-10-31 DIAGNOSIS — F449 Dissociative and conversion disorder, unspecified: Secondary | ICD-10-CM | POA: Diagnosis present

## 2017-10-31 DIAGNOSIS — Z880 Allergy status to penicillin: Secondary | ICD-10-CM

## 2017-10-31 DIAGNOSIS — F1120 Opioid dependence, uncomplicated: Secondary | ICD-10-CM | POA: Diagnosis present

## 2017-10-31 DIAGNOSIS — Z833 Family history of diabetes mellitus: Secondary | ICD-10-CM

## 2017-10-31 DIAGNOSIS — B370 Candidal stomatitis: Secondary | ICD-10-CM | POA: Diagnosis present

## 2017-10-31 DIAGNOSIS — I6381 Other cerebral infarction due to occlusion or stenosis of small artery: Secondary | ICD-10-CM | POA: Diagnosis present

## 2017-10-31 DIAGNOSIS — R262 Difficulty in walking, not elsewhere classified: Secondary | ICD-10-CM

## 2017-10-31 DIAGNOSIS — R569 Unspecified convulsions: Secondary | ICD-10-CM

## 2017-10-31 DIAGNOSIS — W19XXXA Unspecified fall, initial encounter: Secondary | ICD-10-CM | POA: Diagnosis present

## 2017-10-31 DIAGNOSIS — F17210 Nicotine dependence, cigarettes, uncomplicated: Secondary | ICD-10-CM | POA: Diagnosis present

## 2017-10-31 DIAGNOSIS — F319 Bipolar disorder, unspecified: Secondary | ICD-10-CM | POA: Diagnosis present

## 2017-10-31 DIAGNOSIS — G9341 Metabolic encephalopathy: Secondary | ICD-10-CM | POA: Diagnosis present

## 2017-10-31 DIAGNOSIS — I878 Other specified disorders of veins: Secondary | ICD-10-CM | POA: Diagnosis present

## 2017-10-31 DIAGNOSIS — Z7901 Long term (current) use of anticoagulants: Secondary | ICD-10-CM

## 2017-10-31 DIAGNOSIS — Z818 Family history of other mental and behavioral disorders: Secondary | ICD-10-CM

## 2017-10-31 DIAGNOSIS — Y92002 Bathroom of unspecified non-institutional (private) residence single-family (private) house as the place of occurrence of the external cause: Secondary | ICD-10-CM

## 2017-10-31 DIAGNOSIS — R5381 Other malaise: Secondary | ICD-10-CM

## 2017-10-31 DIAGNOSIS — G40909 Epilepsy, unspecified, not intractable, without status epilepticus: Secondary | ICD-10-CM | POA: Diagnosis present

## 2017-10-31 DIAGNOSIS — R531 Weakness: Secondary | ICD-10-CM

## 2017-10-31 DIAGNOSIS — Z79899 Other long term (current) drug therapy: Secondary | ICD-10-CM

## 2017-10-31 DIAGNOSIS — Z87442 Personal history of urinary calculi: Secondary | ICD-10-CM

## 2017-10-31 DIAGNOSIS — R0602 Shortness of breath: Secondary | ICD-10-CM

## 2017-10-31 DIAGNOSIS — I7300 Raynaud's syndrome without gangrene: Secondary | ICD-10-CM | POA: Diagnosis present

## 2017-10-31 DIAGNOSIS — I110 Hypertensive heart disease with heart failure: Secondary | ICD-10-CM | POA: Diagnosis present

## 2017-10-31 DIAGNOSIS — I4891 Unspecified atrial fibrillation: Secondary | ICD-10-CM

## 2017-10-31 DIAGNOSIS — Z7951 Long term (current) use of inhaled steroids: Secondary | ICD-10-CM

## 2017-10-31 DIAGNOSIS — Z7952 Long term (current) use of systemic steroids: Secondary | ICD-10-CM

## 2017-10-31 DIAGNOSIS — E785 Hyperlipidemia, unspecified: Secondary | ICD-10-CM | POA: Diagnosis present

## 2017-10-31 DIAGNOSIS — Z86010 Personal history of colonic polyps: Secondary | ICD-10-CM

## 2017-10-31 DIAGNOSIS — Z888 Allergy status to other drugs, medicaments and biological substances status: Secondary | ICD-10-CM

## 2017-10-31 DIAGNOSIS — R0902 Hypoxemia: Secondary | ICD-10-CM

## 2017-10-31 DIAGNOSIS — E669 Obesity, unspecified: Secondary | ICD-10-CM | POA: Diagnosis present

## 2017-10-31 DIAGNOSIS — I872 Venous insufficiency (chronic) (peripheral): Secondary | ICD-10-CM | POA: Diagnosis present

## 2017-10-31 DIAGNOSIS — K219 Gastro-esophageal reflux disease without esophagitis: Secondary | ICD-10-CM | POA: Diagnosis present

## 2017-10-31 DIAGNOSIS — I259 Chronic ischemic heart disease, unspecified: Secondary | ICD-10-CM | POA: Diagnosis present

## 2017-10-31 DIAGNOSIS — Z9981 Dependence on supplemental oxygen: Secondary | ICD-10-CM

## 2017-10-31 DIAGNOSIS — M5410 Radiculopathy, site unspecified: Secondary | ICD-10-CM

## 2017-10-31 DIAGNOSIS — Z6837 Body mass index (BMI) 37.0-37.9, adult: Secondary | ICD-10-CM

## 2017-10-31 DIAGNOSIS — F419 Anxiety disorder, unspecified: Secondary | ICD-10-CM | POA: Diagnosis present

## 2017-10-31 DIAGNOSIS — D869 Sarcoidosis, unspecified: Secondary | ICD-10-CM | POA: Diagnosis present

## 2017-10-31 DIAGNOSIS — H8090 Unspecified otosclerosis, unspecified ear: Secondary | ICD-10-CM | POA: Diagnosis present

## 2017-10-31 DIAGNOSIS — A419 Sepsis, unspecified organism: Secondary | ICD-10-CM | POA: Diagnosis not present

## 2017-10-31 DIAGNOSIS — Z8249 Family history of ischemic heart disease and other diseases of the circulatory system: Secondary | ICD-10-CM

## 2017-10-31 DIAGNOSIS — Z823 Family history of stroke: Secondary | ICD-10-CM

## 2017-10-31 DIAGNOSIS — M5416 Radiculopathy, lumbar region: Secondary | ICD-10-CM | POA: Diagnosis present

## 2017-10-31 DIAGNOSIS — Z8673 Personal history of transient ischemic attack (TIA), and cerebral infarction without residual deficits: Secondary | ICD-10-CM

## 2017-10-31 DIAGNOSIS — I480 Paroxysmal atrial fibrillation: Secondary | ICD-10-CM | POA: Diagnosis present

## 2017-10-31 DIAGNOSIS — M797 Fibromyalgia: Secondary | ICD-10-CM | POA: Diagnosis present

## 2017-10-31 DIAGNOSIS — Z86711 Personal history of pulmonary embolism: Secondary | ICD-10-CM

## 2017-10-31 DIAGNOSIS — M25512 Pain in left shoulder: Secondary | ICD-10-CM

## 2017-10-31 DIAGNOSIS — I2692 Saddle embolus of pulmonary artery without acute cor pulmonale: Secondary | ICD-10-CM | POA: Diagnosis present

## 2017-10-31 DIAGNOSIS — Z881 Allergy status to other antibiotic agents status: Secondary | ICD-10-CM

## 2017-10-31 DIAGNOSIS — M069 Rheumatoid arthritis, unspecified: Secondary | ICD-10-CM | POA: Diagnosis present

## 2017-10-31 DIAGNOSIS — Z887 Allergy status to serum and vaccine status: Secondary | ICD-10-CM

## 2017-10-31 DIAGNOSIS — Z882 Allergy status to sulfonamides status: Secondary | ICD-10-CM

## 2017-10-31 DIAGNOSIS — F329 Major depressive disorder, single episode, unspecified: Secondary | ICD-10-CM | POA: Diagnosis present

## 2017-10-31 DIAGNOSIS — Z885 Allergy status to narcotic agent status: Secondary | ICD-10-CM

## 2017-10-31 DIAGNOSIS — J690 Pneumonitis due to inhalation of food and vomit: Secondary | ICD-10-CM | POA: Diagnosis present

## 2017-10-31 DIAGNOSIS — E1142 Type 2 diabetes mellitus with diabetic polyneuropathy: Secondary | ICD-10-CM | POA: Diagnosis present

## 2017-10-31 DIAGNOSIS — J449 Chronic obstructive pulmonary disease, unspecified: Secondary | ICD-10-CM | POA: Diagnosis present

## 2017-10-31 DIAGNOSIS — G20 Parkinson's disease: Secondary | ICD-10-CM | POA: Diagnosis present

## 2017-10-31 DIAGNOSIS — T07XXXA Unspecified multiple injuries, initial encounter: Secondary | ICD-10-CM

## 2017-10-31 DIAGNOSIS — W010XXA Fall on same level from slipping, tripping and stumbling without subsequent striking against object, initial encounter: Secondary | ICD-10-CM | POA: Diagnosis present

## 2017-10-31 DIAGNOSIS — I5032 Chronic diastolic (congestive) heart failure: Secondary | ICD-10-CM | POA: Diagnosis present

## 2017-10-31 DIAGNOSIS — F429 Obsessive-compulsive disorder, unspecified: Secondary | ICD-10-CM | POA: Diagnosis present

## 2017-10-31 LAB — URINALYSIS, ROUTINE W REFLEX MICROSCOPIC
BILIRUBIN URINE: NEGATIVE
Glucose, UA: NEGATIVE mg/dL
KETONES UR: 80 mg/dL — AB
Leukocytes, UA: NEGATIVE
NITRITE: NEGATIVE
PH: 5 (ref 5.0–8.0)
Protein, ur: 100 mg/dL — AB
Specific Gravity, Urine: 1.023 (ref 1.005–1.030)

## 2017-10-31 LAB — COMPREHENSIVE METABOLIC PANEL
ALBUMIN: 3.3 g/dL — AB (ref 3.5–5.0)
ALT: 17 U/L (ref 0–44)
AST: 21 U/L (ref 15–41)
Alkaline Phosphatase: 68 U/L (ref 38–126)
Anion gap: 14 (ref 5–15)
BUN: 13 mg/dL (ref 8–23)
CALCIUM: 9.5 mg/dL (ref 8.9–10.3)
CO2: 28 mmol/L (ref 22–32)
CREATININE: 0.9 mg/dL (ref 0.61–1.24)
Chloride: 105 mmol/L (ref 98–111)
GFR calc Af Amer: >60 mL/min (ref >60)
GFR calc non Af Amer: >60 mL/min (ref >60)
GLUCOSE: 93 mg/dL (ref 70–99)
Potassium: 3.5 mmol/L (ref 3.5–5.1)
SODIUM: 147 mmol/L — AB (ref 135–145)
Total Bilirubin: 1.6 mg/dL — ABNORMAL HIGH (ref 0.3–1.2)
Total Protein: 7 g/dL (ref 6.5–8.1)

## 2017-10-31 LAB — I-STAT TROPONIN, ED: TROPONIN I, POC: 0 ng/mL (ref 0.00–0.08)

## 2017-10-31 LAB — CBC WITH DIFFERENTIAL/PLATELET
BASOS PCT: 0 %
Basophils Absolute: 0 10*3/uL (ref 0.0–0.1)
EOS ABS: 0 10*3/uL (ref 0.0–0.7)
Eosinophils Relative: 0 %
HCT: 44.5 % (ref 39.0–52.0)
Hemoglobin: 14.3 g/dL (ref 13.0–17.0)
Lymphocytes Relative: 9 %
Lymphs Abs: 1.2 10*3/uL (ref 0.7–4.0)
MCH: 29.4 pg (ref 26.0–34.0)
MCHC: 32.1 g/dL (ref 30.0–36.0)
MCV: 91.6 fL (ref 78.0–100.0)
MONO ABS: 1.1 10*3/uL — AB (ref 0.1–1.0)
MONOS PCT: 8 %
Neutro Abs: 11.2 10*3/uL — ABNORMAL HIGH (ref 1.7–7.7)
Neutrophils Relative %: 83 %
Platelets: 387 10*3/uL (ref 150–400)
RBC: 4.86 MIL/uL (ref 4.22–5.81)
RDW: 19.7 % — AB (ref 11.5–15.5)
WBC: 13.6 10*3/uL — ABNORMAL HIGH (ref 4.0–10.5)

## 2017-10-31 LAB — CK: Total CK: 447 U/L — ABNORMAL HIGH (ref 49–397)

## 2017-10-31 LAB — I-STAT CG4 LACTIC ACID, ED: Lactic Acid, Venous: 1.05 mmol/L (ref 0.5–1.9)

## 2017-10-31 MED ORDER — POTASSIUM CHLORIDE CRYS ER 20 MEQ PO TBCR
20.0000 meq | EXTENDED_RELEASE_TABLET | ORAL | Status: DC
  Administered 2017-11-03 – 2017-11-09 (×4): 20 meq via ORAL
  Filled 2017-10-31 (×6): qty 1

## 2017-10-31 MED ORDER — APIXABAN 5 MG PO TABS
5.0000 mg | ORAL_TABLET | Freq: Two times a day (BID) | ORAL | Status: DC
  Administered 2017-10-31: 5 mg via ORAL
  Filled 2017-10-31: qty 1

## 2017-10-31 MED ORDER — ONDANSETRON HCL 4 MG PO TABS: 4.0000 mg | ORAL_TABLET | Freq: Four times a day (QID) | ORAL | Status: DC | PRN

## 2017-10-31 MED ORDER — ADULT MULTIVITAMIN W/MINERALS CH
1.0000 | ORAL_TABLET | Freq: Every day | ORAL | Status: DC
  Administered 2017-11-02 – 2017-11-09 (×6): 1 via ORAL
  Filled 2017-10-31 (×6): qty 1

## 2017-10-31 MED ORDER — AMIODARONE HCL 200 MG PO TABS
200.0000 mg | ORAL_TABLET | Freq: Every day | ORAL | Status: DC
  Administered 2017-11-02 – 2017-11-06 (×5): 200 mg via ORAL
  Filled 2017-10-31 (×8): qty 1

## 2017-10-31 MED ORDER — IPRATROPIUM-ALBUTEROL 0.5-2.5 (3) MG/3ML IN SOLN: 3.0000 mL | Freq: Four times a day (QID) | RESPIRATORY_TRACT | Status: DC | PRN

## 2017-10-31 MED ORDER — SENNOSIDES-DOCUSATE SODIUM 8.6-50 MG PO TABS: 1.0000 | ORAL_TABLET | Freq: Every evening | ORAL | Status: DC | PRN

## 2017-10-31 MED ORDER — ATORVASTATIN CALCIUM 40 MG PO TABS
40.0000 mg | ORAL_TABLET | Freq: Every day | ORAL | Status: DC
  Administered 2017-11-03 – 2017-11-09 (×6): 40 mg via ORAL
  Filled 2017-10-31 (×6): qty 1

## 2017-10-31 MED ORDER — SODIUM CHLORIDE 0.9 % IV BOLUS
1000.0000 mL | Freq: Once | INTRAVENOUS | Status: AC
  Administered 2017-10-31: 1000 mL via INTRAVENOUS

## 2017-10-31 MED ORDER — GUAIFENESIN ER 600 MG PO TB12
1200.0000 mg | ORAL_TABLET | Freq: Two times a day (BID) | ORAL | Status: DC
  Administered 2017-10-31 – 2017-11-10 (×15): 1200 mg via ORAL
  Filled 2017-10-31 (×16): qty 2

## 2017-10-31 MED ORDER — SODIUM CHLORIDE 0.9 % IV SOLN
INTRAVENOUS | Status: DC
  Administered 2017-10-31 – 2017-11-01 (×2): via INTRAVENOUS

## 2017-10-31 MED ORDER — PREDNISONE 5 MG PO TABS
5.0000 mg | ORAL_TABLET | Freq: Every day | ORAL | Status: DC
  Administered 2017-11-02 – 2017-11-10 (×9): 5 mg via ORAL
  Filled 2017-10-31 (×10): qty 1

## 2017-10-31 MED ORDER — ALPRAZOLAM 0.5 MG PO TABS
0.5000 mg | ORAL_TABLET | Freq: Three times a day (TID) | ORAL | Status: DC
  Administered 2017-10-31 – 2017-11-10 (×23): 0.5 mg via ORAL
  Filled 2017-10-31 (×24): qty 1

## 2017-10-31 MED ORDER — PANTOPRAZOLE SODIUM 40 MG PO TBEC: 80.0000 mg | DELAYED_RELEASE_TABLET | Freq: Every day | ORAL | Status: DC

## 2017-10-31 MED ORDER — LEVETIRACETAM 500 MG PO TABS
500.0000 mg | ORAL_TABLET | Freq: Two times a day (BID) | ORAL | Status: DC
  Administered 2017-10-31: 500 mg via ORAL
  Filled 2017-10-31: qty 1

## 2017-10-31 MED ORDER — OXYBUTYNIN CHLORIDE 5 MG PO TABS
5.0000 mg | ORAL_TABLET | Freq: Three times a day (TID) | ORAL | Status: DC
  Administered 2017-10-31 – 2017-11-10 (×24): 5 mg via ORAL
  Filled 2017-10-31 (×25): qty 1

## 2017-10-31 MED ORDER — RISPERIDONE 1 MG PO TABS
1.0000 mg | ORAL_TABLET | Freq: Two times a day (BID) | ORAL | Status: DC
  Administered 2017-10-31 – 2017-11-10 (×17): 1 mg via ORAL
  Filled 2017-10-31 (×18): qty 1

## 2017-10-31 MED ORDER — MOMETASONE FURO-FORMOTEROL FUM 200-5 MCG/ACT IN AERO
2.0000 | INHALATION_SPRAY | Freq: Two times a day (BID) | RESPIRATORY_TRACT | Status: DC
  Administered 2017-11-01 – 2017-11-10 (×18): 2 via RESPIRATORY_TRACT
  Filled 2017-10-31: qty 8.8

## 2017-10-31 MED ORDER — ONDANSETRON HCL 4 MG/2ML IJ SOLN: 4.0000 mg | Freq: Four times a day (QID) | INTRAMUSCULAR | Status: DC | PRN

## 2017-10-31 MED ORDER — ACETAMINOPHEN 650 MG RE SUPP: 650.0000 mg | Freq: Four times a day (QID) | RECTAL | Status: DC | PRN

## 2017-10-31 MED ORDER — ACETAMINOPHEN 325 MG PO TABS
650.0000 mg | ORAL_TABLET | Freq: Four times a day (QID) | ORAL | Status: DC | PRN
  Administered 2017-11-02: 650 mg via ORAL
  Filled 2017-10-31: qty 2

## 2017-10-31 MED ORDER — NICOTINE 21 MG/24HR TD PT24
21.0000 mg | MEDICATED_PATCH | Freq: Every day | TRANSDERMAL | Status: DC
  Administered 2017-11-05 – 2017-11-10 (×6): 21 mg via TRANSDERMAL
  Filled 2017-10-31 (×6): qty 1

## 2017-10-31 MED ORDER — DIVALPROEX SODIUM 250 MG PO DR TAB
250.0000 mg | DELAYED_RELEASE_TABLET | Freq: Three times a day (TID) | ORAL | Status: DC
  Administered 2017-10-31: 250 mg via ORAL
  Filled 2017-10-31: qty 1

## 2017-10-31 NOTE — ED Notes (Signed)
Patient transported to X-ray 

## 2017-10-31 NOTE — Progress Notes (Signed)
  Per [place] [facility] pt's last PASSR # was [id number].  Start date was: 09/06/17 and end date was: 10/06/17.  Per notes pt was last D/C'd to [facility] with a 10-day LOG.  Today (9/16) EDP is requesting placement process to begin in ED and is stating that pt is likely to be admitted.  CSW completed FL-2 without a PASSR and will send out to pt';s requested SNF [facility]) once signed by the EDP.  CSW will continue to follow for D/C needs.  Alphonse Guild. Carrisa Keller, LCSW, LCAS, CSI Clinical Social Worker Ph: [phone]

## 2017-10-31 NOTE — ED Provider Notes (Signed)
[facility] DEPT Provider Note   CSN: [id number] Arrival date & time: 10/31/17  1353     History   Chief Complaint Chief Complaint  Patient presents with  . Fall    HPI Patrick Holt is a 67 y.o. male.  67 year old male with extensive past medical history below including recent hospitalization who presents with fall.  The patient was recently in a rehab facility after a weeklong hospitalization for PE.  He was discharged from the rehab facility 2 days ago and states that he was unable to stand or walk at the time of discharge.  He was at home by himself in the bathroom when he had a fall and was unable to get up due to weakness.  He laid on his left side for 2 days until the home health nurse found him today.  He reports pain on his left side and left shoulder from laying on that side.  EMS reported that he was covered in urine and feces when they found him.  He denies any shortness of breath or loss of consciousness.  He has not been able to eat or drink or take any of his medications while he has been on the ground.  The history is provided by the patient.  Fall     Past Medical History:  Diagnosis Date  . Anemia   . Anxiety   . Asthma   . Atrial fibrillation ([place]) 01/2012   with RVR  . Bipolar 1 disorder ([place])   . Cancer ([place])   . Collagen vascular disease ([place])   . Conversion disorder 06/2010  . COPD (chronic obstructive pulmonary disease) ([place])   . Dermatomyositis ([place])   . Dermatomyositis ([place])   . Diverticulitis   . Diverticulosis of colon   . Dysrhythmia    "irregular" (11/15/2012)  . Esophageal dysmotility   . Esophageal stricture   . Fibromyalgia   . Gastritis   . GERD (gastroesophageal reflux disease)   . Hand fracture, right    sept 13, 2017, still wearing splint as og 01-10-2017  . Headache(784.0)    "severe; get them often" (11/15/2012)  . Hiatal hernia   . History of narcotic addiction ([place])   . Hx of adenomatous  colonic polyps   . Hyperlipidemia   . Hypertension   . Internal hemorrhoids   . Ischemic heart disease   . Major depression    with acute psychotic break in 06/2010  . Myocardial infarct ([place])    mulitple (1999, 2000, 2004)  . Narcotic dependence ([place])   . Nephrolithiasis   . Obesity   . OCD (obsessive compulsive disorder)   . Otosclerosis   . Paroxysmal A-fib ([place])   . Peripheral neuropathy   . Raynaud's disease   . Renal insufficiency   . Rheumatoid arthritis(714.0)   . Sarcoidosis   . Seizures ([place])   . Subarachnoid hemorrhage ([place]) 01/2012   with subdural  hematoma.   . Type II diabetes mellitus ([place])   . Urge incontinence   . Vertigo     Patient Active Problem List   Diagnosis Date Noted  . Anasarca 10/11/2017  . Pulmonary embolism ([place]) 10/11/2017  . ARF (acute renal failure) ([place]) 09/17/2017  . Unstable angina ([place]) 06/16/2017  . L1 and L2 vertebral fracture ([place]) 04/06/2017  . Multiple rib fractures 04/06/2017  . Evaluation by psychiatric service required 03/07/2017  . Acute metabolic encephalopathy 95/62/1308  . Hypoglycemia 03/04/2017  . Acute respiratory failure with hypoxia (  [place]) Sep 20, 202019  . Acute encephalopathy Sep 20, 202019  . Rhabdomyolysis 02/05/2017  . Acute respiratory failure with hypoxia and hypercapnia ([place]) 02/05/2017  . COPD with acute exacerbation ([place]) 02/05/2017  . Bipolar 1 disorder ([place]) 02/05/2017  . Anemia 02/05/2017  . Hypertension 02/05/2017  . Hand fracture, right 02/05/2017  . Narcotic dependence ([place]) 02/05/2017  . Pre-diabetes 02/05/2017  . PAF (paroxysmal atrial fibrillation) ([place]) 02/05/2017  . Frequent falls 02/05/2017  . History of dermatomyositis 02/05/2017  . Seizure disorder ([place]) 02/05/2017  . Bilateral lower extremity edema 02/05/2017  . Pulmonary artery hypertension ([place]) 01/15/2017  . Altered mental status 01/13/2017  . Low oxygen saturation   . Pain in right wrist 02/27/2016  . Altered mental state 11/14/2015  .  Severe dehydration 11/14/2015  . Hypernatremia 11/14/2015  . Difficulty in walking, not elsewhere classified   . Right wrist fracture   . Closed displaced fracture of ulnar styloid with routine healing   . Scapholunate ligament injury with no instability   . Falls frequently   . Hypoxia 11/02/2015  . Anxiety 06/06/2015  . Impaired mobility 04/28/2015  . Lumbar spondylosis with myelopathy 03/07/2015  . Nocturia 12/03/2014  . Chronic venous insufficiency 09/23/2014  . Poor social situation 07/30/2014  . Schizophrenia, acute ([place])   . Schizoaffective disorder, bipolar type ([place])   . Decreased visual acuity 06/27/2014  . COPD exacerbation ([place])   . Right sided weakness 01/04/2014  . Essential hypertension, benign   . Atrial fibrillation, unspecified   . Radiculopathy of cervical region   . Polyuria 12/07/2013  . Erectile dysfunction 12/07/2013  . Diverticulitis 08/01/2013  . Acute diverticulitis 08/01/2013  . Hematochezia 06/04/2013  . Dehydration 11/09/2012  . Hypokalemia 10/03/2012  . Benign neoplasm of colon 09/27/2012  . Stricture and stenosis of esophagus 09/27/2012  . Diverticulitis of colon without hemorrhage 07/26/2012  . Polypharmacy 03/26/2012  . Seizures ([place]) 02/10/2012  . Atrial fibrillation ([place]) 02/05/2012  . Coronary atherosclerosis of native coronary artery 02/05/2012  . Radicular low back pain 03/09/2011  . History of seizure disorder 01/29/2011  . Irritable bowel syndrome (IBS) 06/24/2010  . Urge incontinence 06/19/2010  . Tobacco abuse 11/23/2008  . DEPRESSION, MAJOR 05/31/2008  . ISCHEMIC HEART DISEASE 05/31/2008  . RAYNAUD'S DISEASE 05/31/2008  . HIATAL HERNIA 05/31/2008  . Rheumatoid arthritis ([place]) 05/31/2008  . FIBROMYALGIA 05/31/2008  . Hyperlipidemia 01/26/2008  . Anxiety state 01/26/2008  . HEMORRHOIDS, INTERNAL 01/26/2008  . COPD (chronic obstructive pulmonary disease) ([place]) 01/26/2008  . ESOPHAGEAL MOTILITY DISORDER 01/26/2008  . GERD  01/26/2008  . Dermatomyositis ([place]) 01/26/2008    Past Surgical History:  Procedure Laterality Date  . BACK SURGERY    . CARDIAC CATHETERIZATION    . CARPAL TUNNEL RELEASE Bilateral   . CATARACT EXTRACTION W/ INTRAOCULAR LENS IMPLANT Left   . COLONOSCOPY N/A 09/27/2012   Procedure: COLONOSCOPY;  Surgeon: Lafayette Dragon, MD;  Location: [facility] ENDOSCOPY;  Service: Endoscopy;  Laterality: N/A;  . ESOPHAGOGASTRODUODENOSCOPY N/A 09/27/2012   Procedure: ESOPHAGOGASTRODUODENOSCOPY (EGD);  Surgeon: Lafayette Dragon, MD;  Location: [facility];  Service: Endoscopy;  Laterality: N/A;  . KNEE ARTHROSCOPY W/ MENISCAL REPAIR Left 2009  . LUMBAR DISC SURGERY    . LYMPH NODE DISSECTION Right 1970's   "neck; dr thought I had Hodgkins; turned out to be sarcoidosis" (11/15/2012)  . squamous papilloma   2010   removed by Dr. Constance Holster ENT, noted on tongue  . TONSILLECTOMY          Home Medications  Prior to Admission medications   Medication Sig Start Date End Date Taking? Authorizing Provider  ALPRAZolam Duanne Moron) 0.5 MG tablet Take 1 tablet (0.5 mg total) by mouth 3 (three) times daily. 10/14/17   Elgergawy, Silver Huguenin, MD  amiodarone (PACERONE) 200 MG tablet Take 1 tablet (200 mg total) by mouth daily. 09/07/17   Desiree Hane, MD  apixaban (ELIQUIS) 5 MG TABS tablet Take 10 mg oral twice daily for total of  days, then transition to 5 mg oral twice daily on 10/21/2017 10/18/17   Elgergawy, Silver Huguenin, MD  atorvastatin (LIPITOR) 40 MG tablet Take 1 tablet (40 mg total) by mouth daily. 09/07/17   Desiree Hane, MD  budesonide-formoterol (SYMBICORT) 160-4.5 MCG/ACT inhaler Inhale 2 puffs into the lungs 2 (two) times daily. 09/07/17   Oretha Milch D, MD  divalproex (DEPAKOTE) 250 MG DR tablet Take 1 tablet (250 mg total) by mouth 3 (three) times daily. 09/07/17   Oretha Milch D, MD  ergocalciferol (VITAMIN D2) 50000 units capsule Take 1 capsule (50,000 Units total) by mouth once a week. 09/07/17   Desiree Hane, MD   esomeprazole (NEXIUM) 40 MG capsule Take 1 capsule (40 mg total) by mouth daily. 09/07/17   Desiree Hane, MD  guaiFENesin (MUCINEX) 600 MG 12 hr tablet Take 2 tablets (1,200 mg total) by mouth 2 (two) times daily. 10/18/17   Elgergawy, Silver Huguenin, MD  ipratropium-albuterol (DUONEB) 0.5-2.5 (3) MG/3ML SOLN Inhale 3 mLs into the lungs every 6 (six) hours as needed. 09/07/17   Desiree Hane, MD  levETIRAcetam (KEPPRA) 500 MG tablet Take 1 tablet (500 mg total) by mouth 2 (two) times daily. 09/07/17   Desiree Hane, MD  loperamide (IMODIUM) 2 MG capsule Take 1 capsule (2 mg total) by mouth as needed for diarrhea or loose stools. 09/07/17   Desiree Hane, MD  Multiple Vitamin (MULTIVITAMIN WITH MINERALS) TABS tablet Take 1 tablet by mouth daily. 09/07/17   Oretha Milch D, MD  nicotine (NICODERM CQ - DOSED IN MG/24 HOURS) 21 mg/24hr patch Place 1 patch (21 mg total) onto the skin daily. 10/15/17   Elgergawy, Silver Huguenin, MD  oxybutynin (DITROPAN) 5 MG tablet Take 1 tablet (5 mg total) by mouth 3 (three) times daily. 09/07/17   Oretha Milch D, MD  Potassium Chloride ER 20 MEQ TBCR Take 20 mEq by mouth every other day. 09/20/17   Patrecia Pour, MD  predniSONE (DELTASONE) 10 MG tablet Take 0.5 tablets (5 mg total) by mouth daily with breakfast. 10/18/17   Elgergawy, Silver Huguenin, MD  risperiDONE (RISPERDAL) 1 MG tablet Take 1 tablet (1 mg total) by mouth 2 (two) times daily. 09/07/17   Desiree Hane, MD  senna-docusate (SENOKOT-S) 8.6-50 MG tablet Take 1 tablet by mouth at bedtime as needed for mild constipation. 09/07/17   Desiree Hane, MD    Family History Family History  Problem Relation Age of Onset  . Alcohol abuse Mother   . Depression Mother   . Heart disease Mother   . Diabetes Mother   . Stroke Mother   . Coronary artery disease Father   . Diabetes Other        1/2 brother  . Hepatitis Brother     Social History Social History   Tobacco Use  . Smoking status: Light Tobacco Smoker     Packs/day: 0.50    Years: 30.00    Pack years: 15.00    Types: Cigarettes  Last attempt to quit: 12/15/2014    Years since quitting: 2.8  . Smokeless tobacco: Never Used  . Tobacco comment: 1/2 cig BID  Substance Use Topics  . Alcohol use: No    Alcohol/week: 0.0 standard drinks    Comment: Alcohol stopped in September of 2014  . Drug use: No     Allergies   Immune globulins; Rho (d) immune globulin; Ciprofloxacin; Flagyl [metronidazole]; Lisinopril; Sulfa antibiotics; Sulfasalazine; Cefepime; Morphine and related; Requip [ropinirole hcl]; Vancomycin; Betamethasone dipropionate; Betamethasone dipropionate; Bupropion; Bupropion hcl; Clobetasol; Codeine; Escitalopram oxalate; Escitalopram oxalate; Fluoxetine hcl; Fluoxetine hcl; Furosemide; Meclizine; Paroxetine; Penicillins; Tacrolimus; Tetanus toxoid; and Tuberculin purified protein derivative   Review of Systems Review of Systems All other systems reviewed and are negative except that which was mentioned in HPI   Physical Exam Updated Vital Signs BP 128/75   Pulse 78   Temp 98.2 F (36.8 C)   Resp 16   SpO2 98%   Physical Exam  Constitutional: He is oriented to person, place, and time. He appears well-developed and well-nourished. No distress.  Chronically ill appearing  HENT:  Head: Normocephalic and atraumatic.  Mild edema L cheek and eye Severely dry tongue  Eyes: Pupils are equal, round, and reactive to light. Conjunctivae are normal.  Neck: Neck supple.  Cardiovascular: Normal rate, regular rhythm and normal heart sounds.  No murmur heard. Pulmonary/Chest: Effort normal and breath sounds normal.  Abdominal: Soft. Bowel sounds are normal. He exhibits no distension. There is no tenderness.  Musculoskeletal: He exhibits no edema.  Neurological: He is alert and oriented to person, place, and time.  Fluent speech  Skin: Skin is warm and dry.  Erythema and pressure blisters scattered along L chest and abdominal  wall, L thigh, L knee  Psychiatric: He has a normal mood and affect. Judgment normal.  Nursing note and vitals reviewed.    ED Treatments / Results  Labs (all labs ordered are listed, but only abnormal results are displayed) Labs Reviewed  COMPREHENSIVE METABOLIC PANEL - Abnormal; Notable for the following components:      Result Value   Sodium 147 (*)    Albumin 3.3 (*)    Total Bilirubin 1.6 (*)    All other components within normal limits  CBC WITH DIFFERENTIAL/PLATELET - Abnormal; Notable for the following components:   WBC 13.6 (*)    RDW 19.7 (*)    Neutro Abs 11.2 (*)    Monocytes Absolute 1.1 (*)    All other components within normal limits  CK - Abnormal; Notable for the following components:   Total CK 447 (*)    All other components within normal limits  URINALYSIS, ROUTINE W REFLEX MICROSCOPIC - Abnormal; Notable for the following components:   Hgb urine dipstick SMALL (*)    Ketones, ur 80 (*)    Protein, ur 100 (*)    Bacteria, UA RARE (*)    All other components within normal limits  I-STAT TROPONIN, ED  I-STAT CG4 LACTIC ACID, ED  I-STAT CG4 LACTIC ACID, ED    EKG EKG Interpretation  Date/Time:  Monday October 31 2017 14:24:36 EDT Ventricular Rate:  77 PR Interval:    QRS Duration: 107 QT Interval:  395 QTC Calculation: 447 R Axis:   -33 Text Interpretation:  Sinus rhythm Multiform ventricular premature complexes Inferior infarct, old Lateral leads are also involved similar to previous, occasional PVC Confirmed by Theotis Burrow [phone]) on 10/31/2017 2:51:00 PM Also confirmed by Theotis Burrow [phone]), editor Alanda Slim,  Levada Dy [phone])  on 10/31/2017 3:05:02 PM   Radiology Dg Chest 2 View  Result Date: 10/31/2017 CLINICAL DATA:  Fall 2 days ago with persistent chest pain, initial encounter EXAM: CHEST - 2 VIEW COMPARISON:  10/14/2017 FINDINGS: Cardiac shadow is within normal limits. The lungs are well aerated bilaterally. Minimal right basilar  atelectasis is seen. No pneumothorax is. Degenerative changes of the thoracic spine are seen. No definitive rib fractures are noted. IMPRESSION: Mild right basilar atelectasis. No pneumothorax or rib abnormality is seen. Electronically Signed   By: Inez Catalina M.D.   On: 10/31/2017 16:12   Dg Pelvis 1-2 Views  Result Date: 10/31/2017 CLINICAL DATA:  Recent fall 2 days ago with pelvic pain, initial encounter EXAM: PELVIS - 1 VIEW COMPARISON:  None. FINDINGS: Pelvic ring is intact. No acute fracture or dislocation is seen. No gross soft tissue abnormality is noted. IMPRESSION: No acute abnormality noted. Electronically Signed   By: Inez Catalina M.D.   On: 10/31/2017 16:14   Ct Head Wo Contrast  Result Date: 10/31/2017 CLINICAL DATA:  67 y/o M; fall to floor unable to move since Saturday. EXAM: CT HEAD WITHOUT CONTRAST CT CERVICAL SPINE WITHOUT CONTRAST TECHNIQUE: Multidetector CT imaging of the head and cervical spine was performed following the standard protocol without intravenous contrast. Multiplanar CT image reconstructions of the cervical spine were also generated. COMPARISON:  09/17/2017 CT head and cervical spine. FINDINGS: CT HEAD FINDINGS Brain: No evidence of acute infarction, hemorrhage, hydrocephalus, extra-axial collection or mass lesion/mass effect. Stable moderate chronic microvascular ischemic changes and volume loss of the brain. Stable chronic lacunar infarct within left paramedian pons. Vascular: Calcific atherosclerosis of carotid siphons. No hyperdense vessel. Skull: Normal. Negative for fracture or focal lesion. Sinuses/Orbits: Normal aeration of the visible paranasal sinuses and mastoid air cells. Left intra-ocular lens replacement. Other: Right-sided ossicular replacement prosthesis. CT CERVICAL SPINE FINDINGS Alignment: Normal. Skull base and vertebrae: No acute fracture. No primary bone lesion or focal pathologic process. Chronic T1 spinous process fracture. Soft tissues and spinal  canal: No prevertebral fluid or swelling. No visible canal hematoma. Disc levels: Intervertebral disc space heights are maintained. Small endplate marginal osteophytes are present at the C5-6 level. Upper chest: Negative. Other: Mild calcific atherosclerosis of the carotid bifurcations. IMPRESSION: CT head: 1. No acute intracranial abnormality or calvarial fracture. 2. Stable chronic microvascular ischemic changes, volume loss of the brain, and small chronic left paramedian pontine infarct. CT cervical spine: No acute fracture or dislocation. Chronic T1 spinous process fracture. Electronically Signed   By: Kristine Garbe M.D.   On: 10/31/2017 16:24   Ct Cervical Spine Wo Contrast  Result Date: 10/31/2017 CLINICAL DATA:  67 y/o M; fall to floor unable to move since Saturday. EXAM: CT HEAD WITHOUT CONTRAST CT CERVICAL SPINE WITHOUT CONTRAST TECHNIQUE: Multidetector CT imaging of the head and cervical spine was performed following the standard protocol without intravenous contrast. Multiplanar CT image reconstructions of the cervical spine were also generated. COMPARISON:  09/17/2017 CT head and cervical spine. FINDINGS: CT HEAD FINDINGS Brain: No evidence of acute infarction, hemorrhage, hydrocephalus, extra-axial collection or mass lesion/mass effect. Stable moderate chronic microvascular ischemic changes and volume loss of the brain. Stable chronic lacunar infarct within left paramedian pons. Vascular: Calcific atherosclerosis of carotid siphons. No hyperdense vessel. Skull: Normal. Negative for fracture or focal lesion. Sinuses/Orbits: Normal aeration of the visible paranasal sinuses and mastoid air cells. Left intra-ocular lens replacement. Other: Right-sided ossicular replacement prosthesis. CT CERVICAL SPINE FINDINGS Alignment: Normal.  Skull base and vertebrae: No acute fracture. No primary bone lesion or focal pathologic process. Chronic T1 spinous process fracture. Soft tissues and spinal canal:  No prevertebral fluid or swelling. No visible canal hematoma. Disc levels: Intervertebral disc space heights are maintained. Small endplate marginal osteophytes are present at the C5-6 level. Upper chest: Negative. Other: Mild calcific atherosclerosis of the carotid bifurcations. IMPRESSION: CT head: 1. No acute intracranial abnormality or calvarial fracture. 2. Stable chronic microvascular ischemic changes, volume loss of the brain, and small chronic left paramedian pontine infarct. CT cervical spine: No acute fracture or dislocation. Chronic T1 spinous process fracture. Electronically Signed   By: Kristine Garbe M.D.   On: 10/31/2017 16:24   Dg Shoulder Left  Result Date: 10/31/2017 CLINICAL DATA:  Fall 2 days ago with left shoulder pain, initial encounter EXAM: LEFT SHOULDER - 2+ VIEW COMPARISON:  06/26/2014 FINDINGS: Degenerative changes of the acromioclavicular joint are noted. No acute fracture or dislocation is seen. No soft tissue abnormality is noted. The underlying bony thorax is within normal limits. IMPRESSION: Mild degenerative change without acute abnormality. Electronically Signed   By: Inez Catalina M.D.   On: 10/31/2017 16:13    Procedures Procedures (including critical care time)  Medications Ordered in ED Medications  sodium chloride 0.9 % bolus 1,000 mL (1,000 mLs Intravenous New Bag/Given 10/31/17 1522)     Initial Impression / Assessment and Plan / ED Course  I have reviewed the triage vital signs and the nursing notes.  Pertinent labs & imaging results that were available during my care of the patient were reviewed by me and considered in my medical decision making (see chart for details).     Vital signs stable on arrival.  He did have multiple areas of blistering from pressure wounds on his left side suggestive of prolonged immobilization.  UA shows some ketones and protein in his urine, no evidence of infection, normal lactate, normal troponin, sodium 147,  creatinine, normal hemoglobin, WBC 13.6.  CT of head and C-spine, chest x-ray, pelvis x-ray, and left shoulder negative for acute injury.  Gave the patient IV fluids for mildly elevated CK in the 400s.  He has no evidence of severe rhabdomyolysis although I am concerned about his generalized weakness leading to his fall.  I am signing out to the oncoming provider who will follow up on his ability to ambulate after IV fluids.  Final Clinical Impressions(s) / ED Diagnoses   Final diagnoses:  None    ED Discharge Orders    None       Little, Wenda Overland, MD 10/31/17 [phone]

## 2017-10-31 NOTE — ED Notes (Signed)
PT states that he doesn't feel ready to walk, he refused to try with this nurse. Pt states "I know my body with parkinson's and my arm hurting plus my MD I am not going to be able to"

## 2017-10-31 NOTE — Progress Notes (Signed)
CSW reviewed the chart and saw this CSW note from 8/30:  "CSW has spoken with AD as well as Admission Director at [facility]. Facility is agreeable to take pt back. AD is agreeable to 10 day LOG. Pt is aware and agreeable. CSW paged MD.   Loletha Grayer, MSW [phone] 10/14/17"  The above might signify that the pt's last stay was not authorized by pt's insurance and that the   Per the pt's ex-mother-in-law she can be reached at ph: [phone] and a VM is set up.  Per the pt's ex-mother-in-law this number is NOT TO BE GIVEN TO THE PT AS THE PT MAY ABUSE CALLING THE PT'S ex-mother-in-law FOR HELP.  CSW will continue to follow for D/C needs.  Alphonse Guild. Korayma Hagwood, LCSW, LCAS, CSI Clinical Social Worker Ph: [phone]

## 2017-10-31 NOTE — ED Notes (Signed)
ED TO INPATIENT HANDOFF REPORT  Name/Age/Gender Patrick Holt 67 y.o. male  Code Status    Code Status Orders  (From admission, onward)         Start     Ordered   10/31/17 2340  Full code  Continuous     10/31/17 2341        Code Status History    Date Active Date Inactive Code Status Order ID Comments User Context   10/12/2017 0414 10/19/2017 0158 Full Code [id number]  Elwyn Reach, MD Inpatient   09/17/2017 1726 09/20/2017 2331 Full Code [id number]  Thurnell Lose, MD ED   09/04/2017 0120 09/07/2017 1918 Full Code [id number]  Morland, Woodford, DO Inpatient   06/16/2017 0739 06/16/2017 1447 Full Code [id number]  Lady Deutscher, MD ED   03/03/2017 2057 03/07/2017 2125 Full Code [id number]  Norval Morton, MD ED   02/20/2017 2101 02/23/2017 2221 Full Code [id number]  [name], MD Inpatient   02/05/2017 1211 02/10/2017 2051 Full Code [id number]  Samella Parr, NP ED   01/14/2017 0111 01/19/2017 2033 Full Code [id number]  Jani Gravel, MD Inpatient   11/21/2016 0214 11/22/2016 1941 Full Code [id number]  Jani Gravel, MD Inpatient   11/14/2015 1527 11/18/2015 2332 Full Code [id number]  Murlean Iba, MD ED   11/03/2015 0019 11/05/2015 2257 Full Code [id number]  Nicolette Bang, DO Inpatient   12/22/2014 0344 12/22/2014 1827 Full Code [id number]  Virginia Crews, MD Inpatient   07/16/2014 2000 07/23/2014 2045 Full Code [id number]  Clovis Fredrickson, MD Inpatient   07/06/2014 1802 07/16/2014 1927 Full Code [id number]  Olam Idler, MD Inpatient   03/29/2014 0603 03/29/2014 2240 Full Code [id number]  Nobie Putnam, DO Inpatient   01/04/2014 0207 01/06/2014 1630 Full Code [id number]  Leeanne Rio, MD ED   08/01/2013 1431 08/03/2013 1514 Full Code [id number]  Coral Spikes, DO Inpatient   06/04/2013 0619 06/04/2013 2148 Full Code [id number]  Bernadene Bell, MD Inpatient   12/28/2012 1807 12/29/2012 1943 Full Code [id number]  Leeanne Rio, MD ED   11/09/2012  0916 11/16/2012 1823 Full Code [id number]  Montez Morita, MD Inpatient   10/16/2012 2057 10/20/2012 2139 Full Code [id number]  Ma Hillock, DO Inpatient   09/26/2012 1315 09/27/2012 1607 Full Code [id number]  Willia Craze, NP Inpatient   02/12/2012 2349 02/21/2012 2125 Full Code [id number]  Richarda Blade, MD ED    Advance Directive Documentation     Most Recent Value  Type of Advance Directive  Living will  Pre-existing out of facility DNR order (yellow form or pink MOST form)  -  "MOST" Form in Place?  -      Home/SNF/Other Home  Chief Complaint fall  Level of Care/Admitting Diagnosis ED Disposition    ED Disposition Condition [place]: [place] [[id number]]  Level of Care: Med-Surg [16]  Diagnosis: Fall [290176]  Admitting Physician: Etta Quill [phone]  Attending Physician: Etta Quill [[id number]]  PT Class (Do Not Modify): Observation [104]  PT Acc Code (Do Not Modify): Observation [10022]       Medical History Past Medical History:  Diagnosis Date  . Anemia   . Anxiety   . Asthma   . Atrial fibrillation ([place]) 01/2012   with RVR  . Bipolar 1 disorder ([place])   . Cancer ([place])   . Collagen vascular disease ([place])   . Conversion disorder  06/2010  . COPD (chronic obstructive pulmonary disease) ([place])   . Dermatomyositis ([place])   . Dermatomyositis ([place])   . Diverticulitis   . Diverticulosis of colon   . Dysrhythmia    "irregular" (11/15/2012)  . Esophageal dysmotility   . Esophageal stricture   . Fibromyalgia   . Gastritis   . GERD (gastroesophageal reflux disease)   . Hand fracture, right    sept 13, 2017, still wearing splint as og 01-10-2017  . Headache(784.0)    "severe; get them often" (11/15/2012)  . Hiatal hernia   . History of narcotic addiction ([place])   . Hx of adenomatous colonic polyps   . Hyperlipidemia   . Hypertension   . Internal hemorrhoids   . Ischemic heart disease   . Major depression    with  acute psychotic break in 06/2010  . Myocardial infarct ([place])    mulitple (1999, 2000, 2004)  . Narcotic dependence ([place])   . Nephrolithiasis   . Obesity   . OCD (obsessive compulsive disorder)   . Otosclerosis   . Paroxysmal A-fib ([place])   . Peripheral neuropathy   . Raynaud's disease   . Renal insufficiency   . Rheumatoid arthritis(714.0)   . Sarcoidosis   . Seizures ([place])   . Subarachnoid hemorrhage ([place]) 01/2012   with subdural  hematoma.   . Type II diabetes mellitus ([place])   . Urge incontinence   . Vertigo     Allergies Allergies  Allergen Reactions  . Immune Globulins Other (See Comments)    Acute renal failure  . Rho (D) Immune Globulin     Acute renal failure Acute renal failure  . Ciprofloxacin Swelling  . Flagyl [Metronidazole] Swelling  . Lisinopril Diarrhea  . Sulfa Antibiotics Other (See Comments)    blisters  . Sulfasalazine     blisters blisters  . Cefepime Other (See Comments)    Diffuse maculopapular rash when receiving both vanc and cefepime, timing unclear.  . Morphine And Related Other (See Comments)    Ineffective for pain management  . Requip [Ropinirole Hcl] Other (See Comments)    constipation  . Vancomycin     Diffuse maculopapular rash when receiving both vanc/cefepime.  Timing unclear.    . Betamethasone Dipropionate Other (See Comments)    Unknown  . Betamethasone Dipropionate     Unknown  . Bupropion     Unknown  . Bupropion Hcl Other (See Comments)    Unknown  . Clobetasol Other (See Comments)    Unknown Unknown  . Codeine Other (See Comments)    Does not help with pain  . Escitalopram Oxalate Other (See Comments)    Unknown  . Escitalopram Oxalate     Unknown  . Fluoxetine Hcl Other (See Comments)    Unknown  . Fluoxetine Hcl     Unknown  . Furosemide Other (See Comments)    Unknown Unknown  . Meclizine Rash  . Paroxetine Other (See Comments)    Unknown  . Penicillins Other (See Comments)    Unknown reaction Has  patient had a PCN reaction causing immediate rash, facial/tongue/throat swelling, SOB or lightheadedness with hypotension: unknown Has patient had a PCN reaction causing severe rash involving mucus membranes or skin necrosis: unknown Has patient had a PCN reaction that required hospitalization unknown Has patient had a PCN reaction occurring within the last 10 years: unknown If all of the above answers are "NO", then may proceed with Cephalosporin use. Unknown Unknown reactio  . Tacrolimus  Other (See Comments)    Unknown  . Tetanus Toxoid Other (See Comments)    Unknown  . Tuberculin Purified Protein Derivative Other (See Comments)    Unknown    IV Location/Drains/Wounds Patient Lines/Drains/Airways Status   Active Line/Drains/Airways    Name:   Placement date:   Placement time:   Site:   Days:   Peripheral IV 10/31/17 Left Antecubital   10/31/17    1512    Antecubital   less than 1   External Urinary Catheter   10/11/17    2140    -   20   Pressure Injury 10/11/17 Stage II -  Partial thickness loss of dermis presenting as a shallow open ulcer with a red, pink wound bed without slough.   10/11/17    2136     20          Labs/Imaging Results for orders placed or performed during the hospital encounter of 10/31/17 (from the past 48 hour(s))  Comprehensive metabolic panel     Status: Abnormal   Collection Time: 10/31/17  2:59 PM  Result Value Ref Range   Sodium 147 (H) 135 - 145 mmol/L   Potassium 3.5 3.5 - 5.1 mmol/L   Chloride 105 98 - 111 mmol/L   CO2 28 22 - 32 mmol/L   Glucose, Bld 93 70 - 99 mg/dL   BUN 13 8 - 23 mg/dL   Creatinine, Ser 0.90 0.61 - 1.24 mg/dL   Calcium 9.5 8.9 - 10.3 mg/dL   Total Protein 7.0 6.5 - 8.1 g/dL   Albumin 3.3 (L) 3.5 - 5.0 g/dL   AST 21 15 - 41 U/L   ALT 17 0 - 44 U/L   Alkaline Phosphatase 68 38 - 126 U/L   Total Bilirubin 1.6 (H) 0.3 - 1.2 mg/dL   GFR calc non Af Amer >60 >60 mL/min   GFR calc Af Amer >60 >60 mL/min    Comment:  (NOTE) The eGFR has been calculated using the CKD EPI equation. This calculation has not been validated in all clinical situations. eGFR's persistently <60 mL/min signify possible Chronic Kidney Disease.    Anion gap 14 5 - 15    Comment: Performed at [facility], [place] [address], [place], [place] [address]  CBC with Differential     Status: Abnormal   Collection Time: 10/31/17  2:59 PM  Result Value Ref Range   WBC 13.6 (H) 4.0 - 10.5 K/uL   RBC 4.86 4.22 - 5.81 MIL/uL   Hemoglobin 14.3 13.0 - 17.0 g/dL   HCT 44.5 39.0 - 52.0 %   MCV 91.6 78.0 - 100.0 fL   MCH 29.4 26.0 - 34.0 pg   MCHC 32.1 30.0 - 36.0 g/dL   RDW 19.7 (H) 11.5 - 15.5 %   Platelets 387 150 - 400 K/uL   Neutrophils Relative % 83 %   Neutro Abs 11.2 (H) 1.7 - 7.7 K/uL   Lymphocytes Relative 9 %   Lymphs Abs 1.2 0.7 - 4.0 K/uL   Monocytes Relative 8 %   Monocytes Absolute 1.1 (H) 0.1 - 1.0 K/uL   Eosinophils Relative 0 %   Eosinophils Absolute 0.0 0.0 - 0.7 K/uL   Basophils Relative 0 %   Basophils Absolute 0.0 0.0 - 0.1 K/uL    Comment: Performed at [facility], [place] [address] [place],  [address]  CK     Status: Abnormal   Collection Time: 10/31/17  2:59  PM  Result Value Ref Range   Total CK 447 (H) 49 - 397 U/L    Comment: Performed at [facility], [place] [address], [place], [place] [address]  I-stat troponin, ED     Status: None   Collection Time: 10/31/17  3:03 PM  Result Value Ref Range   Troponin i, poc 0.00 0.00 - 0.08 ng/mL   Comment 3            Comment: Due to the release kinetics of cTnI, a negative result within the first hours of the onset of symptoms does not rule out myocardial infarction with certainty. If myocardial infarction is still suspected, repeat the test at appropriate intervals.   I-Stat CG4 Lactic Acid, ED     Status: None   Collection Time: 10/31/17  3:05 PM  Result Value Ref Range   Lactic Acid, Venous 1.05  0.5 - 1.9 mmol/L  Urinalysis, Routine w reflex microscopic     Status: Abnormal   Collection Time: 10/31/17  3:24 PM  Result Value Ref Range   Color, Urine YELLOW YELLOW   APPearance CLEAR CLEAR   Specific Gravity, Urine 1.023 1.005 - 1.030   pH 5.0 5.0 - 8.0   Glucose, UA NEGATIVE NEGATIVE mg/dL   Hgb urine dipstick SMALL (A) NEGATIVE   Bilirubin Urine NEGATIVE NEGATIVE   Ketones, ur 80 (A) NEGATIVE mg/dL   Protein, ur 100 (A) NEGATIVE mg/dL   Nitrite NEGATIVE NEGATIVE   Leukocytes, UA NEGATIVE NEGATIVE   RBC / HPF 0-5 0 - 5 RBC/hpf   WBC, UA 6-10 0 - 5 WBC/hpf   Bacteria, UA RARE (A) NONE SEEN   Squamous Epithelial / LPF 0-5 0 - 5   Mucus PRESENT    Hyaline Casts, UA PRESENT    Granular Casts, UA PRESENT     Comment: Performed at [facility], [place] [address], [place], [place] [address]   Dg Chest 2 View  Result Date: 10/31/2017 CLINICAL DATA:  Fall 2 days ago with persistent chest pain, initial encounter EXAM: CHEST - 2 VIEW COMPARISON:  10/14/2017 FINDINGS: Cardiac shadow is within normal limits. The lungs are well aerated bilaterally. Minimal right basilar atelectasis is seen. No pneumothorax is. Degenerative changes of the thoracic spine are seen. No definitive rib fractures are noted. IMPRESSION: Mild right basilar atelectasis. No pneumothorax or rib abnormality is seen. Electronically Signed   By: Inez Catalina M.D.   On: 10/31/2017 16:12   Dg Pelvis 1-2 Views  Result Date: 10/31/2017 CLINICAL DATA:  Recent fall 2 days ago with pelvic pain, initial encounter EXAM: PELVIS - 1 VIEW COMPARISON:  None. FINDINGS: Pelvic ring is intact. No acute fracture or dislocation is seen. No gross soft tissue abnormality is noted. IMPRESSION: No acute abnormality noted. Electronically Signed   By: Inez Catalina M.D.   On: 10/31/2017 16:14   Ct Head Wo Contrast  Result Date: 10/31/2017 CLINICAL DATA:  67 y/o M; fall to floor unable to move since Saturday. EXAM: CT HEAD WITHOUT  CONTRAST CT CERVICAL SPINE WITHOUT CONTRAST TECHNIQUE: Multidetector CT imaging of the head and cervical spine was performed following the standard protocol without intravenous contrast. Multiplanar CT image reconstructions of the cervical spine were also generated. COMPARISON:  09/17/2017 CT head and cervical spine. FINDINGS: CT HEAD FINDINGS Brain: No evidence of acute infarction, hemorrhage, hydrocephalus, extra-axial collection or mass lesion/mass effect. Stable moderate chronic microvascular ischemic changes and volume loss of the brain. Stable chronic lacunar infarct  within left paramedian pons. Vascular: Calcific atherosclerosis of carotid siphons. No hyperdense vessel. Skull: Normal. Negative for fracture or focal lesion. Sinuses/Orbits: Normal aeration of the visible paranasal sinuses and mastoid air cells. Left intra-ocular lens replacement. Other: Right-sided ossicular replacement prosthesis. CT CERVICAL SPINE FINDINGS Alignment: Normal. Skull base and vertebrae: No acute fracture. No primary bone lesion or focal pathologic process. Chronic T1 spinous process fracture. Soft tissues and spinal canal: No prevertebral fluid or swelling. No visible canal hematoma. Disc levels: Intervertebral disc space heights are maintained. Small endplate marginal osteophytes are present at the C5-6 level. Upper chest: Negative. Other: Mild calcific atherosclerosis of the carotid bifurcations. IMPRESSION: CT head: 1. No acute intracranial abnormality or calvarial fracture. 2. Stable chronic microvascular ischemic changes, volume loss of the brain, and small chronic left paramedian pontine infarct. CT cervical spine: No acute fracture or dislocation. Chronic T1 spinous process fracture. Electronically Signed   By: Kristine Garbe M.D.   On: 10/31/2017 16:24   Ct Cervical Spine Wo Contrast  Result Date: 10/31/2017 CLINICAL DATA:  67 y/o M; fall to floor unable to move since Saturday. EXAM: CT HEAD WITHOUT  CONTRAST CT CERVICAL SPINE WITHOUT CONTRAST TECHNIQUE: Multidetector CT imaging of the head and cervical spine was performed following the standard protocol without intravenous contrast. Multiplanar CT image reconstructions of the cervical spine were also generated. COMPARISON:  09/17/2017 CT head and cervical spine. FINDINGS: CT HEAD FINDINGS Brain: No evidence of acute infarction, hemorrhage, hydrocephalus, extra-axial collection or mass lesion/mass effect. Stable moderate chronic microvascular ischemic changes and volume loss of the brain. Stable chronic lacunar infarct within left paramedian pons. Vascular: Calcific atherosclerosis of carotid siphons. No hyperdense vessel. Skull: Normal. Negative for fracture or focal lesion. Sinuses/Orbits: Normal aeration of the visible paranasal sinuses and mastoid air cells. Left intra-ocular lens replacement. Other: Right-sided ossicular replacement prosthesis. CT CERVICAL SPINE FINDINGS Alignment: Normal. Skull base and vertebrae: No acute fracture. No primary bone lesion or focal pathologic process. Chronic T1 spinous process fracture. Soft tissues and spinal canal: No prevertebral fluid or swelling. No visible canal hematoma. Disc levels: Intervertebral disc space heights are maintained. Small endplate marginal osteophytes are present at the C5-6 level. Upper chest: Negative. Other: Mild calcific atherosclerosis of the carotid bifurcations. IMPRESSION: CT head: 1. No acute intracranial abnormality or calvarial fracture. 2. Stable chronic microvascular ischemic changes, volume loss of the brain, and small chronic left paramedian pontine infarct. CT cervical spine: No acute fracture or dislocation. Chronic T1 spinous process fracture. Electronically Signed   By: Kristine Garbe M.D.   On: 10/31/2017 16:24   Dg Shoulder Left  Result Date: 10/31/2017 CLINICAL DATA:  Fall 2 days ago with left shoulder pain, initial encounter EXAM: LEFT SHOULDER - 2+ VIEW  COMPARISON:  06/26/2014 FINDINGS: Degenerative changes of the acromioclavicular joint are noted. No acute fracture or dislocation is seen. No soft tissue abnormality is noted. The underlying bony thorax is within normal limits. IMPRESSION: Mild degenerative change without acute abnormality. Electronically Signed   By: Inez Catalina M.D.   On: 10/31/2017 16:13    Pending Labs Unresulted Labs (From admission, onward)   None      Vitals/Pain Today's Vitals   10/31/17 1523 10/31/17 1530 10/31/17 1830 10/31/17 2200  BP: 123/76 128/75 129/82 115/80  Pulse:  78 94 88  Resp: _0 Temp:      SpO2:  98% 96% 98%  PainSc:        Isolation Precautions No active isolations  Medications Medications  apixaban (ELIQUIS) tablet 5 mg (5 mg Oral Given 10/31/17 2346)  amiodarone (PACERONE) tablet 200 mg (has no administration in time range)  ALPRAZolam (XANAX) tablet 0.5 mg (0.5 mg Oral Given 10/31/17 2328)  atorvastatin (LIPITOR) tablet 40 mg (has no administration in time range)  mometasone-formoterol (DULERA) 200-5 MCG/ACT inhaler 2 puff (2 puffs Inhalation Not Given 10/31/17 2239)  divalproex (DEPAKOTE) DR tablet 250 mg (250 mg Oral Given 10/31/17 2330)  pantoprazole (PROTONIX) EC tablet 80 mg (has no administration in time range)  ipratropium-albuterol (DUONEB) 0.5-2.5 (3) MG/3ML nebulizer solution 3 mL (has no administration in time range)  guaiFENesin (MUCINEX) 12 hr tablet 1,200 mg (1,200 mg Oral Given 10/31/17 2331)  levETIRAcetam (KEPPRA) tablet 500 mg (500 mg Oral Given 10/31/17 2333)  multivitamin with minerals tablet 1 tablet (has no administration in time range)  nicotine (NICODERM CQ - dosed in mg/24 hours) patch 21 mg (has no administration in time range)  oxybutynin (DITROPAN) tablet 5 mg (5 mg Oral Given 10/31/17 2334)  potassium chloride SA (K-DUR,KLOR-CON) CR tablet 20 mEq (has no administration in time range)  predniSONE (DELTASONE) tablet 5 mg (has no administration in time  range)  risperiDONE (RISPERDAL) tablet 1 mg (1 mg Oral Given 10/31/17 2333)  senna-docusate (Senokot-S) tablet 1 tablet (has no administration in time range)  acetaminophen (TYLENOL) tablet 650 mg (has no administration in time range)    Or  acetaminophen (TYLENOL) suppository 650 mg (has no administration in time range)  ondansetron (ZOFRAN) tablet 4 mg (has no administration in time range)    Or  ondansetron (ZOFRAN) injection 4 mg (has no administration in time range)  0.9 %  sodium chloride infusion ( Intravenous New Bag/Given 10/31/17 2351)  sodium chloride 0.9 % bolus 1,000 mL (0 mLs Intravenous Stopped 10/31/17 1830)    Mobility non-ambulatory ( note was at [facility]) sent home

## 2017-10-31 NOTE — Progress Notes (Addendum)
Consult request has been received. CSW attempting to follow up at present time.  Pt was at [facility] from 9/3-9/12, per chart and was D/C'd home on Saturday 9/12.  Per pt's Caregiver who works for [employer], at ph: [phone] the pt was dropped off by the pt's "ex-mother-in-law" ([phone]) at 4pm Saturday and the pt had attempted to "use the bathroom", fell to the floor and was on the fllor until the pt was found by the pt's caregiver Celeste today (9/16) when the caregiver came to see the pt.  Per the pt's caregiver, the caregiver sees the pt on Mon, Wednesday and Friday for a total of eight hours a week only.   Per the pt's caregiver the pt has no living relatives except for the pt's ex-mother-in-law is his only known relative.  CSW attempted to call the pt's "ex-mother-in-law" ([phone]) and her voicemail was "not set up yet".  Per the chart, pt has had 11-12 days of SNF and it is unknown if the pt's insurance [facility]) will authorize more SNF days.  10:31 PM CSW spoke to pt's "ex-mother-in-law" ([phone]) who stated the pt has a brother whom the pt was afraid of but does not speak to and a sister that the pt is not in contact with.  In addition, the pt's ex-mother-in-law stated she is not in any position to assist the pt (she works three jobs) and stated that the pt doesn't have any type of financial ability to private pay and that the pt is on disability.  Further, the pt is "unable to sign his checks because his hads are too shaky" so the pt's ex-mother-in-law has been writing his checks out for them.  Per pt and chart pt was at [facility] and wishes to return.  CSW attempted to contact the CSW Asst Director and was unable to reach the CSW Asst Director for guidance.  CSW will continue to follow for D/C needs.  Alphonse Guild. Kreig Parson, LCSW, LCAS, CSI Clinical Social Worker Ph: [phone]

## 2017-10-31 NOTE — ED Notes (Signed)
Bed: WA22 Expected date:  Expected time:  Means of arrival:  Comments: 

## 2017-10-31 NOTE — ED Provider Notes (Signed)
8:40 PM-reevaluation after IV fluids and attempt to eat.  Provider who initially saw the patient thought he might need fluids, to improve his ability to ambulate.  Patient has received fluids, and a little bit of applesauce but not much more.  Required staff to feed him because of his "Parkinson's."  He states he has had Parkinson's disease for 2 years but never been formally diagnosed with it.  He was discharged from a rehab facility, 2 days ago, and apparently fell immediately on getting home and has not ambulated since, when he was found this morning.  Patient had visible injuries to the left side of his face and body.  He states now he cannot move his left arm.  Prior to that he had been using a walker to ambulate, at the rehab, and apparently was discharged with that mobility appliance.  Brief exam-alert, dry oral mucous membranes.  No significant facial trauma.  Bruising left upper arm, and left chest wall.  Patient is lucid.  He has a mild tremor, bilaterally.  Clinical Course as of Nov 01 2227  Mon Oct 31, 2017  2052 Images reviewed by me: CT head, CT cervical spine, diagnostic pelvis, diagnostic shoulder, diagnostic test, all without acute injuries.   [EW]  2053 Ambulation trial with walker in the ED, failed even with assistance.   [EW]  2057 The patient's caregiver came to the ED and told nursing that she wanted the patient admitted to rehab.   [EW]    Clinical Course User Index [EW] Daleen Bo, MD   Medical decision making-patient with numerous medical problems and psychiatric illness.  No overt injuries from fall, or complications including rhabdomyolysis.  Patient is having difficulty eating and cannot walk here in the ED, therefore will seek observation evaluation in hospital for further management and diagnostic work-up.  8:58 PM-Consult complete with hospitalist. Patient case explained and discussed.  He agrees to admit patient for further evaluation and treatment. Call ended at  10:20 PM  Plan: Admit   Daleen Bo, MD 10/31/17 2229

## 2017-10-31 NOTE — NC FL2 (Signed)
[place] LEVEL OF CARE SCREENING TOOL     IDENTIFICATION  Patient Name: Patrick Holt Birthdate: 1950/05/23 Sex: male Admission Date (Current Location): 10/31/2017  [facility] and [place] Number:  Herbalist and Address:  [facility],  [place] [address] [place]      Provider Number:    Attending Physician Name and Address:  Etta Quill, DO  Relative Name and Phone Number:       Current Level of Care: Hospital Recommended Level of Care: [place] Prior Approval Number:    Date Approved/Denied:   PASRR Number:    Discharge Plan: SNF    Current Diagnoses: Patient Active Problem List   Diagnosis Date Noted  . History of pulmonary embolism 10/31/2017  . Fall 10/31/2017  . Anasarca 10/11/2017  . Pulmonary embolism ([place]) 10/11/2017  . ARF (acute renal failure) ([place]) 09/17/2017  . Unstable angina ([place]) 06/16/2017  . L1 and L2 vertebral fracture ([place]) 04/06/2017  . Multiple rib fractures 04/06/2017  . Evaluation by psychiatric service required 03/07/2017  . Acute metabolic encephalopathy 48/18/5909  . Hypoglycemia 03/04/2017  . Acute respiratory failure with hypoxia ([place]) 05/08/2017  . Acute encephalopathy 05/08/2017  . Rhabdomyolysis 02/05/2017  . Acute respiratory failure with hypoxia and hypercapnia ([place]) 02/05/2017  . COPD with acute exacerbation ([place]) 02/05/2017  . Bipolar 1 disorder ([place]) 02/05/2017  . Anemia 02/05/2017  . Hypertension 02/05/2017  . Hand fracture, right 02/05/2017  . Narcotic dependence ([place]) 02/05/2017  . Pre-diabetes 02/05/2017  . PAF (paroxysmal atrial fibrillation) ([place]) 02/05/2017  . Frequent falls 02/05/2017  . History of dermatomyositis 02/05/2017  . Seizure disorder ([place]) 02/05/2017  . Bilateral lower extremity edema 02/05/2017  . Pulmonary artery hypertension ([place]) 01/15/2017  . Altered mental status 01/13/2017  . Low oxygen saturation   . Pain in right wrist  02/27/2016  . Altered mental state 11/14/2015  . Severe dehydration 11/14/2015  . Hypernatremia 11/14/2015  . Difficulty in walking, not elsewhere classified   . Right wrist fracture   . Closed displaced fracture of ulnar styloid with routine healing   . Scapholunate ligament injury with no instability   . Falls frequently   . Hypoxia 11/02/2015  . Anxiety 06/06/2015  . Impaired mobility 04/28/2015  . Lumbar spondylosis with myelopathy 03/07/2015  . Nocturia 12/03/2014  . Chronic venous insufficiency 09/23/2014  . Poor social situation 07/30/2014  . Schizophrenia, acute ([place])   . Schizoaffective disorder, bipolar type ([place])   . Decreased visual acuity 06/27/2014  . COPD exacerbation ([place])   . Right sided weakness 01/04/2014  . Essential hypertension, benign   . Atrial fibrillation, unspecified   . Radiculopathy of cervical region   . Polyuria 12/07/2013  . Erectile dysfunction 12/07/2013  . Diverticulitis 08/01/2013  . Acute diverticulitis 08/01/2013  . Hematochezia 06/04/2013  . Dehydration 11/09/2012  . Hypokalemia 10/03/2012  . Benign neoplasm of colon 09/27/2012  . Stricture and stenosis of esophagus 09/27/2012  . Diverticulitis of colon without hemorrhage 07/26/2012  . Polypharmacy 03/26/2012  . Seizures ([place]) 02/10/2012  . Atrial fibrillation ([place]) 02/05/2012  . Coronary atherosclerosis of native coronary artery 02/05/2012  . Radicular low back pain 03/09/2011  . History of seizure disorder 01/29/2011  . Irritable bowel syndrome (IBS) 06/24/2010  . Urge incontinence 06/19/2010  . Tobacco abuse 11/23/2008  . DEPRESSION, MAJOR 05/31/2008  . ISCHEMIC HEART DISEASE 05/31/2008  . RAYNAUD'S DISEASE 05/31/2008  . HIATAL HERNIA 05/31/2008  . Rheumatoid arthritis ([place])  05/31/2008  . FIBROMYALGIA 05/31/2008  . Hyperlipidemia 01/26/2008  . Anxiety state 01/26/2008  . HEMORRHOIDS, INTERNAL 01/26/2008  . COPD (chronic obstructive pulmonary disease) ([place]) 01/26/2008  .  ESOPHAGEAL MOTILITY DISORDER 01/26/2008  . GERD 01/26/2008  . Dermatomyositis ([place]) 01/26/2008    Orientation RESPIRATION BLADDER Height & Weight     Self, Time, Situation, Place  Normal Continent Weight:   Height:     BEHAVIORAL SYMPTOMS/MOOD NEUROLOGICAL BOWEL NUTRITION STATUS    (S) Convulsions/Seizures(Pt has Parkinson's per the pt and muscular dystrophy) Continent (Low-sodium, heart healthy, no fructose)  AMBULATORY STATUS COMMUNICATION OF NEEDS Skin   Extensive Assist Verbally Normal                       Personal Care Assistance Level of Assistance    Bathing Assistance: Limited assistance Feeding assistance: Limited assistance Dressing Assistance: Limited assistance     Functional Limitations Info    Sight Info: Adequate Hearing Info: Adequate Speech Info: Adequate    SPECIAL CARE FACTORS FREQUENCY  PT (By licensed PT), OT (By licensed OT), Blood pressure(HYpertension) Blood Pressure Frequency: Unknown   PT Frequency: 5 OT Frequency: 5            Contractures Contractures Info: Not present    Additional Factors Info  Code Status, Allergies Code Status Info: FULL Allergies Info: Immune Globulins, Rho (D) Immune Globulin, Ciprofloxacin, Flagyl Metronidazole, Lisinopril, Sulfa Antibiotics, Sulfasalazine, Cefepime, Morphine And Related, Requip Ropinirole Hcl, Vancomycin, Betamethasone Dipropionate, Betamethasone Dipropionate, Bupropion, Bupropion Hcl, Clobetasol, Codeine, Escitalopram Oxalate, Escitalopram Oxalate, Fluoxetine Hcl, Fluoxetine Hcl, Furosemide, Meclizine, Paroxetine, Penicillins, Tacrolimus, Tetanus Toxoid, Tuberculin Purified Protein Derivative           Current Medications (10/31/2017):  This is the current hospital active medication list Current Facility-Administered Medications  Medication Dose Route Frequency Provider Last Rate Last Dose  . 0.9 %  sodium chloride infusion   Intravenous Continuous Etta Quill, DO      .  acetaminophen (TYLENOL) tablet 650 mg  650 mg Oral Q6H PRN Etta Quill, DO       Or  . acetaminophen (TYLENOL) suppository 650 mg  650 mg Rectal Q6H PRN Etta Quill, DO      . ALPRAZolam Duanne Moron) tablet 0.5 mg  0.5 mg Oral TID Etta Quill, DO   0.5 mg at 10/31/17 2328  . [START ON 11/01/2017] amiodarone (PACERONE) tablet 200 mg  200 mg Oral Daily Jennette Kettle M, DO      . apixaban Arne Cleveland) tablet 5 mg  5 mg Oral BID Etta Quill, DO      . [START ON 11/01/2017] atorvastatin (LIPITOR) tablet 40 mg  40 mg Oral Daily Jennette Kettle M, DO      . divalproex (DEPAKOTE) DR tablet 250 mg  250 mg Oral TID Etta Quill, DO   250 mg at 10/31/17 2330  . guaiFENesin (MUCINEX) 12 hr tablet 1,200 mg  1,200 mg Oral BID Jennette Kettle M, DO   1,200 mg at 10/31/17 2331  . ipratropium-albuterol (DUONEB) 0.5-2.5 (3) MG/3ML nebulizer solution 3 mL  3 mL Inhalation Q6H PRN Etta Quill, DO      . levETIRAcetam (KEPPRA) tablet 500 mg  500 mg Oral BID Etta Quill, DO   500 mg at 10/31/17 2333  . mometasone-formoterol (DULERA) 200-5 MCG/ACT inhaler 2 puff  2 puff Inhalation BID Etta Quill, DO      . [START ON 11/01/2017] multivitamin with minerals  tablet 1 tablet  1 tablet Oral Daily Etta Quill, DO      . [START ON 11/01/2017] nicotine (NICODERM CQ - dosed in mg/24 hours) patch 21 mg  21 mg Transdermal Daily Alcario Drought, Jared M, DO      . ondansetron [facility]) tablet 4 mg  4 mg Oral Q6H PRN Etta Quill, DO       Or  . ondansetron [facility]) injection 4 mg  4 mg Intravenous Q6H PRN Etta Quill, DO      . oxybutynin (DITROPAN) tablet 5 mg  5 mg Oral TID Etta Quill, DO   5 mg at 10/31/17 2334  . [START ON 11/01/2017] pantoprazole (PROTONIX) EC tablet 80 mg  80 mg Oral Q1200 Etta Quill, DO      . [START ON 11/01/2017] potassium chloride SA (K-DUR,KLOR-CON) CR tablet 20 mEq  20 mEq Oral Marni Griffon, DO      . [START ON 11/01/2017] predniSONE (DELTASONE) tablet  5 mg  5 mg Oral Q breakfast Jennette Kettle M, DO      . risperiDONE (RISPERDAL) tablet 1 mg  1 mg Oral BID Etta Quill, DO   1 mg at 10/31/17 2333  . senna-docusate (Senokot-S) tablet 1 tablet  1 tablet Oral QHS PRN Etta Quill, DO       Current Outpatient Medications  Medication Sig Dispense Refill  . ALPRAZolam (XANAX) 0.5 MG tablet Take 1 tablet (0.5 mg total) by mouth 3 (three) times daily. 12 tablet 0  . amiodarone (PACERONE) 200 MG tablet Take 1 tablet (200 mg total) by mouth daily. 60 tablet 0  . apixaban (ELIQUIS) 5 MG TABS tablet Take 10 mg oral twice daily for total of  days, then transition to 5 mg oral twice daily on 10/21/2017 60 tablet   . atorvastatin (LIPITOR) 40 MG tablet Take 1 tablet (40 mg total) by mouth daily. 30 tablet 0  . budesonide-formoterol (SYMBICORT) 160-4.5 MCG/ACT inhaler Inhale 2 puffs into the lungs 2 (two) times daily. 1 Inhaler 0  . divalproex (DEPAKOTE) 250 MG DR tablet Take 1 tablet (250 mg total) by mouth 3 (three) times daily.    . ergocalciferol (VITAMIN D2) 50000 units capsule Take 1 capsule (50,000 Units total) by mouth once a week.    . esomeprazole (NEXIUM) 40 MG capsule Take 1 capsule (40 mg total) by mouth daily.    Marland Kitchen guaiFENesin (MUCINEX) 600 MG 12 hr tablet Take 2 tablets (1,200 mg total) by mouth 2 (two) times daily.    Marland Kitchen ipratropium-albuterol (DUONEB) 0.5-2.5 (3) MG/3ML SOLN Inhale 3 mLs into the lungs every 6 (six) hours as needed. (Patient taking differently: Inhale 3 mLs into the lungs every 6 (six) hours as needed (wheezing). ) 360 mL   . levETIRAcetam (KEPPRA) 500 MG tablet Take 1 tablet (500 mg total) by mouth 2 (two) times daily.    Marland Kitchen loperamide (IMODIUM) 2 MG capsule Take 1 capsule (2 mg total) by mouth as needed for diarrhea or loose stools. 30 capsule 0  . Multiple Vitamin (MULTIVITAMIN WITH MINERALS) TABS tablet Take 1 tablet by mouth daily. 90 tablet 3  . nicotine (NICODERM CQ - DOSED IN MG/24 HOURS) 21 mg/24hr patch Place 1  patch (21 mg total) onto the skin daily. 28 patch 0  . oxybutynin (DITROPAN) 5 MG tablet Take 1 tablet (5 mg total) by mouth 3 (three) times daily.    . Potassium Chloride ER 20 MEQ TBCR Take 20  mEq by mouth every other day.    . predniSONE (DELTASONE) 10 MG tablet Take 0.5 tablets (5 mg total) by mouth daily with breakfast.    . risperiDONE (RISPERDAL) 1 MG tablet Take 1 tablet (1 mg total) by mouth 2 (two) times daily.    Marland Kitchen senna-docusate (SENOKOT-S) 8.6-50 MG tablet Take 1 tablet by mouth at bedtime as needed for mild constipation.       Discharge Medications: Please see discharge summary for a list of discharge medications.  Relevant Imaging Results:  Relevant Lab Results:   Additional Information [id number]  Alphonse Guild Nikolaj Geraghty, LCSWA

## 2017-10-31 NOTE — ED Triage Notes (Signed)
Pt bib EMS and coming from home.  Pt was discharged from rehab ([facility]) on Saturday.  Pt was still unable to stand or walk at time of discharge.  Pt was at home and was in the bathroom when pt slid to the ground and has been there since Saturday.  Pt was found by the home health nurse who came to check on him.  Pt a/o x 4.  EMS noted possible dislocation to left shoulder.  Pt was found lying on his left side and blistered are noted to the left abd area.

## 2017-10-31 NOTE — Clinical Social Work Note (Signed)
Clinical Social Work Assessment  Patient Details  Name: Patrick Holt MRN: [id number] Date of Birth: 1950-04-14  Date of referral:  10/31/17               Reason for consult:  Facility Placement                Permission sought to share information with:  Facility Art therapist granted to share information::  Yes, Verbal Permission Granted  Name::        Agency::     Relationship::     Contact Information:     Housing/Transportation Living arrangements for the past 2 months:  [place], [place] of Information:  Patient(Ex-mother-in-law and caregiver) Patient Interpreter Needed:  None Criminal Activity/Legal Involvement Pertinent to Current Situation/Hospitalization:    Significant Relationships:  (Ex-mother-in-law and caregiver) Lives with:  Self Do you feel safe going back to the place where you live?  No Need for family participation in patient care:  No ([place])  Care giving concerns:   Pt was at [facility] from 9/3-9/12, per chart and was D/C'd home on Saturday 9/12.  Per pt's Caregiver who works for [employer], at ph: [phone] the pt was dropped off by the pt's "ex-mother-in-law" ([phone]) at 4pm Saturday and the pt had attempted to "use the bathroom", fell to the floor and was on the fllor until the pt was found by the pt's caregiver Celeste today (9/16) when the caregiver came to see the pt.  Per the pt's caregiver, the caregiver sees the pt on Mon, Wednesday and Friday for a total of eight hours a week only.   Per the pt's caregiver the pt has no living relatives except for the pt's ex-mother-in-law is his only known relative.  CSW attempted to call the pt's "ex-mother-in-law" ([phone]) and her voicemail was "not set up yet".  Per the chart, pt has had 11-12 days of SNF and it is unknown if the pt's insurance [facility]) will authorize more SNF days.  10:31 PM CSW spoke to  pt's "ex-mother-in-law" ([phone]) who stated the pt has a brother whom the pt was afraid of but does not speak to and a sister that the pt is not in contact with.  In addition, the pt's ex-mother-in-law stated she is not in any position to assist the pt (she works three jobs) and stated that the pt doesn't have any type of financial ability to private pay and that the pt is on disability.  Further, the pt is "unable to sign his checks because his hads are too shaky" so the pt's ex-mother-in-law has been writing his checks out for them.  Per pt and chart pt was at [facility] and wishes to return.  CSW attempted to contact the CSW Asst Director and was unable to reach the CSW Asst Director for guidance.   Per [employer] MUST pt's last PASSR # was [id number].  Start date was: 09/06/17 and end date was: 10/06/17.  Per notes pt was last D/C'd to [facility] with a 10-day LOG.  Today (9/16) EDP is requesting placement process to begin in ED and is stating that pt is likely to be admitted.  CSW completed FL-2 without a PASSR and will send out to pt';s requested SNF [facility]) once signed by the EDP.  CSW reviewed the chart and saw this CSW note from 8/30:  "CSW has spoken with AD as well as Admission Director at [facility]. Facility is agreeable to take pt back. AD is agreeable to 10 day LOG. Pt is aware and agreeable. CSW paged MD.  Loletha Grayer, MSW [phone] 10/14/17"  The above might signify that the pt's last stay was not authorized by pt's insurance and that the   Per the pt's ex-mother-in-law she can be reached at ph: [phone] and a VM is set up.  Per the pt's ex-mother-in-law this number is NOT TO BE GIVEN TO THE PT AS THE PT MAY ABUSE CALLING THE PT'S ex-mother-in-law FOR HELP.  Social Worker assessment / plan:  CSW met with pt and confirmed pt's plan to be discharged to [facility] SNF to live at discharge.  CSW provided active listening and validated pt's concerns.    CSW will complete FL-2 and send referrals out to [facility] SNF facility via the hub per pt's request.  Pt has been living independently prior to being admitted to [facility].   Employment status:  Retired Nurse, adult PT Recommendations:  Not assessed at this time Information / Referral to community resources:     Patient/Family's Response to care:  Patient alert and oriented.  Patient agreeable to plan.  Pt's has no one fully supportive and strongly involved in pt.'s care.  Pt presented with flat affect during CSW intervention.    Patient/Family's Understanding of and Emotional Response to Diagnosis, Current Treatment, and Prognosis:  Still assessing   Emotional Assessment Appearance:  Appears stated age Attitude/Demeanor/Rapport:    Affect (typically observed):  Flat, Accepting, Calm, Stoic Orientation:  Oriented to Self, Oriented to Place, Oriented to  Time, Oriented to Situation Alcohol / Substance use:    Psych involvement (Current and /or in the community):     Discharge Needs  Concerns to be addressed:  No discharge needs identified Readmission within the last 30 days:  Yes Current discharge risk:  Lives alone Barriers to Discharge:  No Barriers Identified   Claudine Mouton, LCSWA 10/31/2017, 11:56 PM

## 2017-11-01 ENCOUNTER — Observation Stay (HOSPITAL_COMMUNITY): Payer: Medicare Other

## 2017-11-01 ENCOUNTER — Observation Stay (HOSPITAL_COMMUNITY)
Admission: EM | Admit: 2017-11-01 | Discharge: 2017-11-01 | Disposition: A | Discharge status: Home / Self Care | Payer: Medicare Other | Source: Home / Self Care | Attending: Internal Medicine | Admitting: Internal Medicine

## 2017-11-01 DIAGNOSIS — T07XXXA Unspecified multiple injuries, initial encounter: Secondary | ICD-10-CM | POA: Diagnosis not present

## 2017-11-01 DIAGNOSIS — I480 Paroxysmal atrial fibrillation: Secondary | ICD-10-CM | POA: Diagnosis not present

## 2017-11-01 DIAGNOSIS — R262 Difficulty in walking, not elsewhere classified: Secondary | ICD-10-CM | POA: Diagnosis not present

## 2017-11-01 DIAGNOSIS — J410 Simple chronic bronchitis: Secondary | ICD-10-CM | POA: Diagnosis not present

## 2017-11-01 DIAGNOSIS — I639 Cerebral infarction, unspecified: Secondary | ICD-10-CM

## 2017-11-01 DIAGNOSIS — W19XXXA Unspecified fall, initial encounter: Secondary | ICD-10-CM

## 2017-11-01 LAB — CBC WITH DIFFERENTIAL/PLATELET
BASOS PCT: 0 %
Basophils Absolute: 0 10*3/uL (ref 0.0–0.1)
EOS ABS: 0 10*3/uL (ref 0.0–0.7)
Eosinophils Relative: 0 %
HEMATOCRIT: 39.6 % (ref 39.0–52.0)
Hemoglobin: 12.7 g/dL — ABNORMAL LOW (ref 13.0–17.0)
LYMPHS ABS: 1.3 10*3/uL (ref 0.7–4.0)
Lymphocytes Relative: 16 %
MCH: 29.5 pg (ref 26.0–34.0)
MCHC: 32.1 g/dL (ref 30.0–36.0)
MCV: 91.9 fL (ref 78.0–100.0)
MONO ABS: 1 10*3/uL (ref 0.1–1.0)
MONOS PCT: 12 %
Neutro Abs: 6.3 10*3/uL (ref 1.7–7.7)
Neutrophils Relative %: 72 %
Platelets: 314 10*3/uL (ref 150–400)
RBC: 4.31 MIL/uL (ref 4.22–5.81)
RDW: 19.9 % — AB (ref 11.5–15.5)
WBC: 8.7 10*3/uL (ref 4.0–10.5)

## 2017-11-01 LAB — COMPREHENSIVE METABOLIC PANEL
ALBUMIN: 2.6 g/dL — AB (ref 3.5–5.0)
ALT: 17 U/L (ref 0–44)
ANION GAP: 9 (ref 5–15)
AST: 22 U/L (ref 15–41)
Alkaline Phosphatase: 55 U/L (ref 38–126)
BILIRUBIN TOTAL: 1.2 mg/dL (ref 0.3–1.2)
BUN: 16 mg/dL (ref 8–23)
CALCIUM: 8.9 mg/dL (ref 8.9–10.3)
CO2: 27 mmol/L (ref 22–32)
Chloride: 109 mmol/L (ref 98–111)
Creatinine, Ser: 0.91 mg/dL (ref 0.61–1.24)
GFR calc non Af Amer: >60 mL/min (ref >60)
GLUCOSE: 101 mg/dL — AB (ref 70–99)
Potassium: 3.3 mmol/L — ABNORMAL LOW (ref 3.5–5.1)
SODIUM: 145 mmol/L (ref 135–145)
TOTAL PROTEIN: 5.8 g/dL — AB (ref 6.5–8.1)

## 2017-11-01 LAB — VALPROIC ACID LEVEL: VALPROIC ACID LVL: 10 ug/mL — AB (ref 50.0–100.0)

## 2017-11-01 LAB — LIPID PANEL
CHOL/HDL RATIO: 2.6 ratio
Cholesterol: 97 mg/dL (ref 0–200)
HDL: 38 mg/dL — ABNORMAL LOW (ref >40)
LDL Cholesterol: 47 mg/dL (ref 0–99)
Triglycerides: 59 mg/dL (ref <150)
VLDL: 12 mg/dL (ref 0–40)

## 2017-11-01 LAB — PHOSPHORUS: PHOSPHORUS: 3.9 mg/dL (ref 2.5–4.6)

## 2017-11-01 LAB — APTT: aPTT: 74 seconds — ABNORMAL HIGH (ref 24–36)

## 2017-11-01 LAB — AMMONIA: Ammonia: 43 umol/L — ABNORMAL HIGH (ref 9–35)

## 2017-11-01 LAB — MAGNESIUM: Magnesium: 1.9 mg/dL (ref 1.7–2.4)

## 2017-11-01 MED ORDER — LEVETIRACETAM IN NACL 1500 MG/100ML IV SOLN
1500.0000 mg | Freq: Once | INTRAVENOUS | Status: AC
  Administered 2017-11-01: 1500 mg via INTRAVENOUS
  Filled 2017-11-01: qty 100

## 2017-11-01 MED ORDER — CHLORHEXIDINE GLUCONATE 0.12 % MT SOLN
15.0000 mL | Freq: Two times a day (BID) | OROMUCOSAL | Status: DC
  Administered 2017-11-01 – 2017-11-10 (×16): 15 mL via OROMUCOSAL
  Filled 2017-11-01 (×14): qty 15

## 2017-11-01 MED ORDER — VALPROATE SODIUM 500 MG/5ML IV SOLN
250.0000 mg | Freq: Three times a day (TID) | INTRAVENOUS | Status: DC
  Administered 2017-11-01 – 2017-11-02 (×4): 250 mg via INTRAVENOUS
  Filled 2017-11-01 (×5): qty 2.5

## 2017-11-01 MED ORDER — OXYCODONE-ACETAMINOPHEN 5-325 MG PO TABS
2.0000 | ORAL_TABLET | Freq: Once | ORAL | Status: AC
  Administered 2017-11-01: 2 via ORAL
  Filled 2017-11-01: qty 2

## 2017-11-01 MED ORDER — POTASSIUM CHLORIDE 10 MEQ/100ML IV SOLN
10.0000 meq | INTRAVENOUS | Status: AC
  Administered 2017-11-01 (×4): 10 meq via INTRAVENOUS
  Filled 2017-11-01 (×4): qty 100

## 2017-11-01 MED ORDER — ORAL CARE MOUTH RINSE
15.0000 mL | Freq: Two times a day (BID) | OROMUCOSAL | Status: DC
  Administered 2017-11-05 – 2017-11-10 (×9): 15 mL via OROMUCOSAL

## 2017-11-01 MED ORDER — OXYCODONE-ACETAMINOPHEN 5-325 MG PO TABS
1.0000 | ORAL_TABLET | Freq: Four times a day (QID) | ORAL | Status: DC | PRN
  Administered 2017-11-03 – 2017-11-07 (×5): 2 via ORAL
  Filled 2017-11-01 (×5): qty 2

## 2017-11-01 MED ORDER — HEPARIN (PORCINE) IN NACL 100-0.45 UNIT/ML-% IJ SOLN
1200.0000 [IU]/h | INTRAMUSCULAR | Status: DC
  Administered 2017-11-01: 1200 [IU]/h via INTRAVENOUS
  Filled 2017-11-01 (×2): qty 250

## 2017-11-01 MED ORDER — LEVETIRACETAM IN NACL 500 MG/100ML IV SOLN
500.0000 mg | Freq: Two times a day (BID) | INTRAVENOUS | Status: DC
  Administered 2017-11-01 – 2017-11-02 (×2): 500 mg via INTRAVENOUS
  Filled 2017-11-01 (×3): qty 100

## 2017-11-01 MED ORDER — SODIUM CHLORIDE 0.9 % IV BOLUS
500.0000 mL | Freq: Once | INTRAVENOUS | Status: AC
  Administered 2017-11-01: 500 mL via INTRAVENOUS

## 2017-11-01 NOTE — Consult Note (Signed)
Requesting Physician: Dr. Alfredia Ferguson    Chief Complaint: Altered mental status, fall  History obtained from: Patient and Chart     HPI:                                                                                                                                       Patrick Holt is an 68 y.o. male with past medical history of atrial fibrillation on Eliquis, diastolic heart failure, COPD, conversion disorder, chronic lower extremity stasis, bipolar disorder, subdural hemorrhage, seizures presents to [facility] long after being found down on the floor.  Patient apparently was discharged from rehab and fell in the bathroom on Saturday.  He was found yesterday and brought to the hospital.  Patient was admitted for generalized weakness and difficulty walking.  However noted to be weak on the left side, particularly left arm.  The hospitalist on evaluating the patient this morning-he is he was very difficult to arouse and eyes rolled back and fluttering.  Neurology was consulted for concern for subclinical seizures given his past history of seizures.  An MRI brain was obtained which showed a small infarct in the left cortical/subcortical junction in the left parietal lobe.  Patient was loaded with 1.5 g of Keppra.  Stat EEG was obtained which did not show any epileptiform activity.  Date last known well: 9.14.19 tPA Given: no, Outside window Baseline MRS 3     Past Medical History:  Diagnosis Date  . Anemia   . Anxiety   . Asthma   . Atrial fibrillation ([place]) 01/2012   with RVR  . Bipolar 1 disorder ([place])   . Cancer ([place])   . Collagen vascular disease ([place])   . Conversion disorder 06/2010  . COPD (chronic obstructive pulmonary disease) ([place])   . Dermatomyositis ([place])   . Dermatomyositis ([place])   . Diverticulitis   . Diverticulosis of colon   . Dysrhythmia    "irregular" (11/15/2012)  . Esophageal dysmotility   . Esophageal stricture   . Fibromyalgia   . Gastritis   . GERD  (gastroesophageal reflux disease)   . Hand fracture, right    sept 13, 2017, still wearing splint as og 01-10-2017  . Headache(784.0)    "severe; get them often" (11/15/2012)  . Hiatal hernia   . History of narcotic addiction ([place])   . Hx of adenomatous colonic polyps   . Hyperlipidemia   . Hypertension   . Internal hemorrhoids   . Ischemic heart disease   . Major depression    with acute psychotic break in 06/2010  . Myocardial infarct ([place])    mulitple (1999, 2000, 2004)  . Narcotic dependence ([place])   . Nephrolithiasis   . Obesity   . OCD (obsessive compulsive disorder)   . Otosclerosis   . Paroxysmal A-fib ([place])   . Peripheral neuropathy   . Raynaud's disease   . Renal insufficiency   .  Rheumatoid arthritis(714.0)   . Sarcoidosis   . Seizures ([place])   . Subarachnoid hemorrhage ([place]) 01/2012   with subdural  hematoma.   . Type II diabetes mellitus ([place])   . Urge incontinence   . Vertigo     Past Surgical History:  Procedure Laterality Date  . BACK SURGERY    . CARDIAC CATHETERIZATION    . CARPAL TUNNEL RELEASE Bilateral   . CATARACT EXTRACTION W/ INTRAOCULAR LENS IMPLANT Left   . COLONOSCOPY N/A 09/27/2012   Procedure: COLONOSCOPY;  Surgeon: Lafayette Dragon, MD;  Location: [facility];  Service: Endoscopy;  Laterality: N/A;  . ESOPHAGOGASTRODUODENOSCOPY N/A 09/27/2012   Procedure: ESOPHAGOGASTRODUODENOSCOPY (EGD);  Surgeon: Lafayette Dragon, MD;  Location: [facility];  Service: Endoscopy;  Laterality: N/A;  . KNEE ARTHROSCOPY W/ MENISCAL REPAIR Left 2009  . LUMBAR DISC SURGERY    . LYMPH NODE DISSECTION Right 1970's   "neck; dr thought I had Hodgkins; turned out to be sarcoidosis" (11/15/2012)  . squamous papilloma   2010   removed by Dr. Constance Holster ENT, noted on tongue  . TONSILLECTOMY      Family History  Problem Relation Age of Onset  . Alcohol abuse Mother   . Depression Mother   . Heart disease Mother   . Diabetes Mother   . Stroke Mother   . Coronary artery  disease Father   . Diabetes Other        1/2 brother  . Hepatitis Brother    Social History:  reports that he has been smoking cigarettes. He has a 15.00 pack-year smoking history. He has never used smokeless tobacco. He reports that he does not drink alcohol or use drugs.  Allergies:  Allergies  Allergen Reactions  . Immune Globulins Other (See Comments)    Acute renal failure  . Rho (D) Immune Globulin     Acute renal failure Acute renal failure  . Ciprofloxacin Swelling  . Flagyl [Metronidazole] Swelling  . Lisinopril Diarrhea  . Sulfa Antibiotics Other (See Comments)    blisters  . Sulfasalazine     blisters blisters  . Cefepime Other (See Comments)    Diffuse maculopapular rash when receiving both vanc and cefepime, timing unclear.  . Morphine And Related Other (See Comments)    Ineffective for pain management  . Requip [Ropinirole Hcl] Other (See Comments)    constipation  . Vancomycin     Diffuse maculopapular rash when receiving both vanc/cefepime.  Timing unclear.    . Betamethasone Dipropionate Other (See Comments)    Unknown  . Betamethasone Dipropionate     Unknown  . Bupropion     Unknown  . Bupropion Hcl Other (See Comments)    Unknown  . Clobetasol Other (See Comments)    Unknown Unknown  . Codeine Other (See Comments)    Does not help with pain  . Escitalopram Oxalate Other (See Comments)    Unknown  . Escitalopram Oxalate     Unknown  . Fluoxetine Hcl Other (See Comments)    Unknown  . Fluoxetine Hcl     Unknown  . Furosemide Other (See Comments)    Unknown Unknown  . Meclizine Rash  . Paroxetine Other (See Comments)    Unknown  . Penicillins Other (See Comments)    Unknown reaction Has patient had a PCN reaction causing immediate rash, facial/tongue/throat swelling, SOB or lightheadedness with hypotension: unknown Has patient had a PCN reaction causing severe rash involving mucus membranes or skin necrosis: unknown Has  patient had a PCN  reaction that required hospitalization unknown Has patient had a PCN reaction occurring within the last 10 years: unknown If all of the above answers are "NO", then may proceed with Cephalosporin use. Unknown Unknown reactio  . Tacrolimus Other (See Comments)    Unknown  . Tetanus Toxoid Other (See Comments)    Unknown  . Tuberculin Purified Protein Derivative Other (See Comments)    Unknown    Medications:                                                                                                                        I reviewed home medications   ROS:                                                                                                                                     14 systems reviewed and negative except above    Examination:                                                                                                      General: Appears well-developed and well-nourished.  Psych: Affect appropriate to situation Eyes: No scleral injection HENT: No OP obstrucion Head: Normocephalic.  Cardiovascular: Normal rate and regular rhythm.  Respiratory: Effort normal and breath sounds normal to anterior ascultation GI: Soft.  No distension. There is no tenderness.  Skin: WDI    Neurological Examination Mental Status: Alert, oriented, thought content appropriate.  Speech fluent without evidence of aphasia. Able to follow 3 step commands without difficulty. Cranial Nerves: II: Visual fields grossly normal,  III,IV, VI: ptosis not present, extra-ocular motions intact bilaterally, pupils equal, round, reactive to light and accommodation V,VII: smile symmetric, facial light touch sensation normal bilaterally VIII: hearing normal bilaterally IX,X: uvula rises symmetrically XI: bilateral shoulder shrug XII: midline tongue extension Motor: Right : Upper extremity   4/5    Left:     Upper extremity   4/5  Lower extremity  3+/5    Lower extremity   3/5 Tone and  bulk:normal tone throughout; no atrophy noted Sensory: Pinprick and light touch Deep Tendon Reflexes: 1+ and symmetric throughout Plantars: Right: downgoing   Left: downgoing Cerebellar: Finger-nose testing limited by tremors Gait: Unable to assess     Lab Results: Basic Metabolic Panel: Recent Labs  Lab 10/31/17 1459 11/01/17 0837  NA 147* 145  K 3.5 3.3*  CL 105 109  CO2 28 27  GLUCOSE 93 101*  BUN 13 16  CREATININE 0.90 0.91  CALCIUM 9.5 8.9  MG  --  1.9  PHOS  --  3.9    CBC: Recent Labs  Lab 10/31/17 1459 11/01/17 0837  WBC 13.6* 8.7  NEUTROABS 11.2* 6.3  HGB 14.3 12.7*  HCT 44.5 39.6  MCV 91.6 91.9  PLT 387 314    Coagulation Studies: No results for input(s): LABPROT, INR in the last 72 hours.  Imaging: Dg Chest 2 View  Result Date: 10/31/2017 CLINICAL DATA:  Fall 2 days ago with persistent chest pain, initial encounter EXAM: CHEST - 2 VIEW COMPARISON:  10/14/2017 FINDINGS: Cardiac shadow is within normal limits. The lungs are well aerated bilaterally. Minimal right basilar atelectasis is seen. No pneumothorax is. Degenerative changes of the thoracic spine are seen. No definitive rib fractures are noted. IMPRESSION: Mild right basilar atelectasis. No pneumothorax or rib abnormality is seen. Electronically Signed   By: Inez Catalina M.D.   On: 10/31/2017 16:12   Dg Pelvis 1-2 Views  Result Date: 10/31/2017 CLINICAL DATA:  Recent fall 2 days ago with pelvic pain, initial encounter EXAM: PELVIS - 1 VIEW COMPARISON:  None. FINDINGS: Pelvic ring is intact. No acute fracture or dislocation is seen. No gross soft tissue abnormality is noted. IMPRESSION: No acute abnormality noted. Electronically Signed   By: Inez Catalina M.D.   On: 10/31/2017 16:14   Ct Head Wo Contrast  Result Date: 10/31/2017 CLINICAL DATA:  67 y/o M; fall to floor unable to move since Saturday. EXAM: CT HEAD WITHOUT CONTRAST CT CERVICAL SPINE WITHOUT CONTRAST TECHNIQUE: Multidetector CT  imaging of the head and cervical spine was performed following the standard protocol without intravenous contrast. Multiplanar CT image reconstructions of the cervical spine were also generated. COMPARISON:  09/17/2017 CT head and cervical spine. FINDINGS: CT HEAD FINDINGS Brain: No evidence of acute infarction, hemorrhage, hydrocephalus, extra-axial collection or mass lesion/mass effect. Stable moderate chronic microvascular ischemic changes and volume loss of the brain. Stable chronic lacunar infarct within left paramedian pons. Vascular: Calcific atherosclerosis of carotid siphons. No hyperdense vessel. Skull: Normal. Negative for fracture or focal lesion. Sinuses/Orbits: Normal aeration of the visible paranasal sinuses and mastoid air cells. Left intra-ocular lens replacement. Other: Right-sided ossicular replacement prosthesis. CT CERVICAL SPINE FINDINGS Alignment: Normal. Skull base and vertebrae: No acute fracture. No primary bone lesion or focal pathologic process. Chronic T1 spinous process fracture. Soft tissues and spinal canal: No prevertebral fluid or swelling. No visible canal hematoma. Disc levels: Intervertebral disc space heights are maintained. Small endplate marginal osteophytes are present at the C5-6 level. Upper chest: Negative. Other: Mild calcific atherosclerosis of the carotid bifurcations. IMPRESSION: CT head: 1. No acute intracranial abnormality or calvarial fracture. 2. Stable chronic microvascular ischemic changes, volume loss of the brain, and small chronic left paramedian pontine infarct. CT cervical spine: No acute fracture or dislocation. Chronic T1 spinous process fracture. Electronically Signed   By: Kristine Garbe M.D.   On: 10/31/2017 16:24   Ct Cervical  Spine Wo Contrast  Result Date: 10/31/2017 CLINICAL DATA:  67 y/o M; fall to floor unable to move since Saturday. EXAM: CT HEAD WITHOUT CONTRAST CT CERVICAL SPINE WITHOUT CONTRAST TECHNIQUE: Multidetector CT imaging  of the head and cervical spine was performed following the standard protocol without intravenous contrast. Multiplanar CT image reconstructions of the cervical spine were also generated. COMPARISON:  09/17/2017 CT head and cervical spine. FINDINGS: CT HEAD FINDINGS Brain: No evidence of acute infarction, hemorrhage, hydrocephalus, extra-axial collection or mass lesion/mass effect. Stable moderate chronic microvascular ischemic changes and volume loss of the brain. Stable chronic lacunar infarct within left paramedian pons. Vascular: Calcific atherosclerosis of carotid siphons. No hyperdense vessel. Skull: Normal. Negative for fracture or focal lesion. Sinuses/Orbits: Normal aeration of the visible paranasal sinuses and mastoid air cells. Left intra-ocular lens replacement. Other: Right-sided ossicular replacement prosthesis. CT CERVICAL SPINE FINDINGS Alignment: Normal. Skull base and vertebrae: No acute fracture. No primary bone lesion or focal pathologic process. Chronic T1 spinous process fracture. Soft tissues and spinal canal: No prevertebral fluid or swelling. No visible canal hematoma. Disc levels: Intervertebral disc space heights are maintained. Small endplate marginal osteophytes are present at the C5-6 level. Upper chest: Negative. Other: Mild calcific atherosclerosis of the carotid bifurcations. IMPRESSION: CT head: 1. No acute intracranial abnormality or calvarial fracture. 2. Stable chronic microvascular ischemic changes, volume loss of the brain, and small chronic left paramedian pontine infarct. CT cervical spine: No acute fracture or dislocation. Chronic T1 spinous process fracture. Electronically Signed   By: Kristine Garbe M.D.   On: 10/31/2017 16:24   Mr Brain Wo Contrast  Result Date: 11/01/2017 CLINICAL DATA:  [place] in the bathroom 3 days ago.  Possible stroke. EXAM: MRI HEAD WITHOUT CONTRAST TECHNIQUE: Multiplanar, multiecho pulse sequences of the brain and surrounding structures  were obtained without intravenous contrast. COMPARISON:  CT studies 10/31/2017 FINDINGS: Brain: Diffusion imaging shows a 5 mm acute infarction affecting the cortical and subcortical brain at the deep insula/temporoparietal junction on the left. No swelling, hemorrhage or mass effect. No other acute infarction. There chronic small-vessel ischemic changes of the pons. There are a few old small vessel cerebellar infarctions. Cerebral hemispheres elsewhere show chronic small-vessel ischemic changes the white matter. No large vessel territory infarction. Mass lesion, hemorrhage, hydrocephalus or extra-axial collection. Vascular: Major vessels at the base of the brain show flow. Skull and upper cervical spine: Negative Sinuses/Orbits: Clear/normal Other: None IMPRESSION: 5 mm acute infarction affecting the cortical and subcortical brain at the deep insula/temporoparietal junction region on the left. No mass effect or hemorrhage. Atrophy and extensive chronic small-vessel ischemic changes elsewhere. Electronically Signed   By: Nelson Chimes M.D.   On: 11/01/2017 12:41   Dg Shoulder Left  Result Date: 10/31/2017 CLINICAL DATA:  Fall 2 days ago with left shoulder pain, initial encounter EXAM: LEFT SHOULDER - 2+ VIEW COMPARISON:  06/26/2014 FINDINGS: Degenerative changes of the acromioclavicular joint are noted. No acute fracture or dislocation is seen. No soft tissue abnormality is noted. The underlying bony thorax is within normal limits. IMPRESSION: Mild degenerative change without acute abnormality. Electronically Signed   By: Inez Catalina M.D.   On: 10/31/2017 16:13     ASSESSMENT AND PLAN  22 y male with multiple comorbidities admitted after a fall for weakness.  MRI brain Shows small infarct in the left parietotemporal lobe, likely incidental.  Neurology was consulted for lethargic state this morning and confirmed concern for subclinical status.   Encephalopathy/Possible Seizure Differential diagnosis  includes seizure with postictal state, however other possibilities include hypoxia, hypercarbia.  Stat EEG negative epileptiform activity  Plan Continue Keppra and Depakote at home doses, if patient has another event of  unexplained AMS, may consider LTM EEG and increasing AED.   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   Plan Limited stroke work-up including carotid Doppler and echocardiogram is sufficient.   Resume Eliquis. Neurochecks  Left upper extremity weakness -  likely due to limitation from pain at the shoulder joint pain. Consider shoulder CT/MRI.   Chronic lower extremity weakness  - likely multifactorial -neuropathy, lumbar radiculopathy, venous stasis, heart failure, deconditioning currently walks with a walker.  Plan Continue PT/OT evaluation and optimization of medical comorbidities  NCS as outpatient   Bilateral tremors, chronic - essential tremors vs depakote induced tremors  Plan:  - will need to managed outpatient, continue depakote for now. VPA level only 10.    Sushanth Aroor [facility] Pager Number [phone]

## 2017-11-01 NOTE — Care Management Note (Signed)
Case Management Note  Patient Details  Name: Patrick Holt MRN: [id number] Date of Birth: 17-Feb-1950  Subjective/Objective:                  unable to move left side of body  Action/Plan: From rehab: [name] Will follow for progression of care and clinical status. Will follow for case management needs none present at this time. Expected Discharge Date:                  Expected Discharge Plan:  [place]  In-House Referral:     Discharge planning Services  CM Consult  Post Acute Care Choice:    Choice offered to:     DME Arranged:    DME Agency:     HH Arranged:    [place] Agency:     Status of Service:  In process, will continue to follow  If discussed at Long Length of Stay Meetings, dates discussed:    Additional Comments:  Leeroy Cha, RN 11/01/2017, 9:00 AM

## 2017-11-01 NOTE — Progress Notes (Signed)
ANTICOAGULATION CONSULT NOTE - Initial Consult  Pharmacy Consult for IV heparin Indication: atrial fibrillation  Allergies  Allergen Reactions  . Immune Globulins Other (See Comments)    Acute renal failure  . Rho (D) Immune Globulin     Acute renal failure Acute renal failure  . Ciprofloxacin Swelling  . Flagyl [Metronidazole] Swelling  . Lisinopril Diarrhea  . Sulfa Antibiotics Other (See Comments)    blisters  . Sulfasalazine     blisters blisters  . Cefepime Other (See Comments)    Diffuse maculopapular rash when receiving both vanc and cefepime, timing unclear.  . Morphine And Related Other (See Comments)    Ineffective for pain management  . Requip [Ropinirole Hcl] Other (See Comments)    constipation  . Vancomycin     Diffuse maculopapular rash when receiving both vanc/cefepime.  Timing unclear.    . Betamethasone Dipropionate Other (See Comments)    Unknown  . Betamethasone Dipropionate     Unknown  . Bupropion     Unknown  . Bupropion Hcl Other (See Comments)    Unknown  . Clobetasol Other (See Comments)    Unknown Unknown  . Codeine Other (See Comments)    Does not help with pain  . Escitalopram Oxalate Other (See Comments)    Unknown  . Escitalopram Oxalate     Unknown  . Fluoxetine Hcl Other (See Comments)    Unknown  . Fluoxetine Hcl     Unknown  . Furosemide Other (See Comments)    Unknown Unknown  . Meclizine Rash  . Paroxetine Other (See Comments)    Unknown  . Penicillins Other (See Comments)    Unknown reaction Has patient had a PCN reaction causing immediate rash, facial/tongue/throat swelling, SOB or lightheadedness with hypotension: unknown Has patient had a PCN reaction causing severe rash involving mucus membranes or skin necrosis: unknown Has patient had a PCN reaction that required hospitalization unknown Has patient had a PCN reaction occurring within the last 10 years: unknown If all of the above answers are "NO", then may  proceed with Cephalosporin use. Unknown Unknown reactio  . Tacrolimus Other (See Comments)    Unknown  . Tetanus Toxoid Other (See Comments)    Unknown  . Tuberculin Purified Protein Derivative Other (See Comments)    Unknown    Patient Measurements: Height: 5\' 3"  (160 cm) IBW/kg (Calculated) : 56.9 Heparin Dosing Weight: 78.9 kg  Vital Signs: Temp: 98.7 F (37.1 C) (09/17 0747) Temp Source: Oral (09/17 0747) BP: 110/63 (09/17 0747) Pulse Rate: 69 (09/17 0747)  Labs: Recent Labs    10/31/17 1459 11/01/17 0837  HGB 14.3 12.7*  HCT 44.5 39.6  PLT 387 314  CREATININE 0.90 0.91  CKTOTAL 447*  --     CrCl cannot be calculated (Unknown ideal weight.).   Medical History: Past Medical History:  Diagnosis Date  . Anemia   . Anxiety   . Asthma   . Atrial fibrillation ([place]) 01/2012   with RVR  . Bipolar 1 disorder ([place])   . Cancer ([place])   . Collagen vascular disease ([place])   . Conversion disorder 06/2010  . COPD (chronic obstructive pulmonary disease) ([place])   . Dermatomyositis ([place])   . Dermatomyositis ([place])   . Diverticulitis   . Diverticulosis of colon   . Dysrhythmia    "irregular" (11/15/2012)  . Esophageal dysmotility   . Esophageal stricture   . Fibromyalgia   . Gastritis   . GERD (gastroesophageal reflux disease)   .  Hand fracture, right    sept 13, 2017, still wearing splint as og 01-10-2017  . Headache(784.0)    "severe; get them often" (11/15/2012)  . Hiatal hernia   . History of narcotic addiction ([place])   . Hx of adenomatous colonic polyps   . Hyperlipidemia   . Hypertension   . Internal hemorrhoids   . Ischemic heart disease   . Major depression    with acute psychotic break in 06/2010  . Myocardial infarct ([place])    mulitple (1999, 2000, 2004)  . Narcotic dependence ([place])   . Nephrolithiasis   . Obesity   . OCD (obsessive compulsive disorder)   . Otosclerosis   . Paroxysmal A-fib ([place])   . Peripheral neuropathy   . Raynaud's disease   .  Renal insufficiency   . Rheumatoid arthritis(714.0)   . Sarcoidosis   . Seizures ([place])   . Subarachnoid hemorrhage ([place]) 01/2012   with subdural  hematoma.   . Type II diabetes mellitus ([place])   . Urge incontinence   . Vertigo     Medications:  Scheduled:  . ALPRAZolam  0.5 mg Oral TID  . amiodarone  200 mg Oral Daily  . atorvastatin  40 mg Oral Daily  . chlorhexidine  15 mL Mouth Rinse BID  . guaiFENesin  1,200 mg Oral BID  . mouth rinse  15 mL Mouth Rinse q12n4p  . mometasone-formoterol  2 puff Inhalation BID  . multivitamin with minerals  1 tablet Oral Daily  . nicotine  21 mg Transdermal Daily  . oxybutynin  5 mg Oral TID  . pantoprazole  80 mg Oral Q1200  . potassium chloride SA  20 mEq Oral QODAY  . predniSONE  5 mg Oral Q breakfast  . risperiDONE  1 mg Oral BID    Assessment: Pharmacy is consulted to dose heparin in 67 yo male with PMH of Afib. Pt previously on apixaban. Pt less responsive this AM and unable to take PO meds, so being transitioned to IV heparin. Last dose of apixaban was marked as given at 2346 on 9/16.   Today, 11/01/17   Hgb 12.7, plt 314      Goal of Therapy:  Heparin level 0.3-0.7 units/ml Monitor platelets by anticoagulation protocol: Yes   Plan:   Start heparin 1200 units/hr with no bolus  Will monitor aPTT due to recent apixaban   Monitor for signs and symptoms of bleeding   Royetta Asal, PharmD, BCPS Pager [phone] 11/01/2017 2:53 PM

## 2017-11-01 NOTE — Procedures (Signed)
ELECTROENCEPHALOGRAM REPORT   Patient: Patrick Holt       Room #: 1791 EEG No. ID: [id number] Age: 67 y.o.        Sex: male Referring Physician: Alfredia Ferguson Report Date:  11/01/2017        Interpreting Physician: Alexis Goodell  History: Patrick Holt is an 67 y.o. male with lethargy s/p infarct  Medications:  Xanax, Pacerone, Lipitor, Keppra, Dulera, Nicoderm. Ditropan, Protonix, K-dur, Deltasone, Depacon, Heparin  Conditions of Recording:  This is a 21 channel routine scalp EEG performed with bipolar and monopolar montages arranged in accordance to the international 10/20 system of electrode placement. One channel was dedicated to EKG recording.  The patient is in the awake and drowsy states.  Description:  The waking background activity consists of a low voltage, symmetrical, fairly well organized, 8-9 Hz alpha activity, seen from the parieto-occipital and posterior temporal regions.  Low voltage fast activity, poorly organized, is seen anteriorly and is at times superimposed on more posterior regions.  A mixture of theta and alpha rhythms are seen from the central and temporal regions. The patient drowses with slowing to irregular, low voltage theta and beta activity.   Stage II sleep is not obtained. No epileptiform activity is noted.   Hyperventilation and intermittent photic stimulation were not performed.   IMPRESSION: Normal electroencephalogram, awake and drowsy. There are no focal lateralizing or epileptiform features.   Alexis Goodell, MD Neurology [phone] 11/01/2017, 2:51 PM

## 2017-11-01 NOTE — Progress Notes (Addendum)
Patient was admitted early this morning after midnight and H&P has been reviewed and I am in current agreement with the assessment and plan done by Dr. Jennette Kettle however additional changes to the plan of care been made accordingly.  Patient is a 67 year old Caucasian male with a past medical history significant for atrial fibrillation, diastolic CHF, COPD, conversion disorder, history of subdural hematoma and hemorrhage, history of seizure disorder, history of asthma, right heart failure, chronic lower extremity venous stasis dermatitis and other comorbidities who presented to ED with generalized weakness a fall and inability ambulate.  Patient fell on Saturday after being discharged from rehab and was able to get up.  Per report he was found early this morning with visible injuries of the left side of his face and body with inability to move his left arm now.  Prior to that he was in ablating with a walker.  Patient was admitted for his generalized weakness and difficulty walking and when I went to go examined him this morning was somnolent and had his eyes rolled back with fluttering.  Patient was very difficult to arouse however when aroused he would answer appropriately.  I felt concerned that he may be having a seizure or subclinical seizures so I change his medications from p.o. to IV and loaded him with 1.5 g of Keppra and I called neurology who recommended EEG and agreed with current plan of care.  Patient also had an MRI being ordered and currently that is being done right now.  I discussed with the nurse earlier he was awake and talking however after he received a narcotic medication became encephalopathic and hypotensive.  Blood pressure is improved after fluid bolus.  Patient is currently done an MRI I discussed the case with neurology Dr. Lorraine Lax who will see the patient in consultation however if patient is continually seizing will need to be transferred to [facility] for a long-term EEG  monitoring.   ADDENDUM 1420: MRI came back and show the patient had acute CVA however on my discussion with neurology he does not feel that this is the cause of patient's somnolence and encephalopathy.  We will however still transfer to [facility] for further evaluation recommendations and possible long-term EEG.  Neurology to see the patient once they are been transferred to [facility].  I have ordered an echocardiogram, lipid panel, hemoglobin A1c, SLP, PT OT, and further work-up per neurology once they arrive.  Neurology will also follow-up on EEG has been ordered currently.  Signout was given to team 12 hospitalist.  ADDENDUM 1350: Neurology Dr. Lorraine Lax evaluated the patient and patient was alert and oriented and EEG showed no signs of seizure.  Dr. Lorraine Lax felt that the patient did not need transfer to [facility] for further evaluation and states that the stroke work-up could be done here because he thinks his stroke is very insignificant and was an incidental finding.  Dr. Lorraine Lax thinks patient's arm weakness is secondary to his fall and recommends further imaging or orthopedic evaluation of his left shoulder.  As for now he feels that the patient can be safely managed at [facility] and they will follow along for further recommendations and evaluation.

## 2017-11-01 NOTE — Progress Notes (Signed)
OT Cancellation Note  Patient Details Name: RENDON HOWELL MRN: [id number] DOB: 04/08/1950   Cancelled Treatment:    Reason Eval/Treat Not Completed: Medical issues which prohibited therapy.  Pt having neuro workup.  Sleepy after MRI.   Elayne Gruver 11/01/2017, 1:05 PM  Lesle Chris, OTR/L Acute Rehabilitation Services [phone] [facility] pager [phone] office 11/01/2017

## 2017-11-01 NOTE — Progress Notes (Signed)
Pt's family member (stated she was an ex-in law) came to visit this evening. She stated that she is the one that helps the most with him when he is in the hospital or at home. She says that this week is too busy and difficult to try to get him medicare but she will do her best to try next week.

## 2017-11-01 NOTE — Evaluation (Signed)
SLP Cancellation Note  Patient Details Name: Patrick Holt MRN: [id number] DOB: 1950/06/24   Cancelled treatment:       Reason Eval/Treat Not Completed: Medical issues which prohibited therapy;Fatigue/lethargy limiting ability to participate   Macario Golds 11/01/2017, 3:46 PM   Luanna Salk, [place] SLP Acute Rehab Services Pager [phone] Office [phone]

## 2017-11-01 NOTE — H&P (Addendum)
History and Physical    Patrick Holt QBH:[id number] DOB: 25-Jan-1951 DOA: 10/31/2017  PCP: [facility], [facility]  Patient coming from: Home  I have personally briefly reviewed patient's old medical records in [place]  Chief Complaint: Fall  HPI: Patrick Holt is a 67 y.o. male with medical history significant of multiple comorbidities including atrial fibrillation, diastolic dysfunction CHF, COPD, conversion disorder, asthma, right heart failure, chronic lower extremity stasis dermatitis who presented to the ER with generalized weakness, fall, and inability to ambulate.  He says he fell at home on Sat after being discharged from rehab, wasn't able to get up.  Was apparently found this morning, visible injuries to L side of face and body.  States he now cant move his left arm.  Prior to that was using walker to ambulate at rehab apparently.    ED Course: Work up neg for acute lab or x ray findings in ED.  Patient states he cant walk and requires staff to feed him because of his "parkinson's" which he has had for past 2 years but not formally diagnosed by neurologist.  Hospitalist asked to admit.   Review of Systems: As per HPI otherwise 10 point review of systems negative.   Past Medical History:  Diagnosis Date  . Anemia   . Anxiety   . Asthma   . Atrial fibrillation ([place]) 01/2012   with RVR  . Bipolar 1 disorder ([place])   . Cancer ([place])   . Collagen vascular disease ([place])   . Conversion disorder 06/2010  . COPD (chronic obstructive pulmonary disease) ([place])   . Dermatomyositis ([place])   . Dermatomyositis ([place])   . Diverticulitis   . Diverticulosis of colon   . Dysrhythmia    "irregular" (11/15/2012)  . Esophageal dysmotility   . Esophageal stricture   . Fibromyalgia   . Gastritis   . GERD (gastroesophageal reflux disease)   . Hand fracture, right    sept 13, 2017, still wearing splint as og 01-10-2017  . Headache(784.0)    "severe; get them  often" (11/15/2012)  . Hiatal hernia   . History of narcotic addiction ([place])   . Hx of adenomatous colonic polyps   . Hyperlipidemia   . Hypertension   . Internal hemorrhoids   . Ischemic heart disease   . Major depression    with acute psychotic break in 06/2010  . Myocardial infarct ([place])    mulitple (1999, 2000, 2004)  . Narcotic dependence ([place])   . Nephrolithiasis   . Obesity   . OCD (obsessive compulsive disorder)   . Otosclerosis   . Paroxysmal A-fib ([place])   . Peripheral neuropathy   . Raynaud's disease   . Renal insufficiency   . Rheumatoid arthritis(714.0)   . Sarcoidosis   . Seizures ([place])   . Subarachnoid hemorrhage ([place]) 01/2012   with subdural  hematoma.   . Type II diabetes mellitus ([place])   . Urge incontinence   . Vertigo     Past Surgical History:  Procedure Laterality Date  . BACK SURGERY    . CARDIAC CATHETERIZATION    . CARPAL TUNNEL RELEASE Bilateral   . CATARACT EXTRACTION W/ INTRAOCULAR LENS IMPLANT Left   . COLONOSCOPY N/A 09/27/2012   Procedure: COLONOSCOPY;  Surgeon: Lafayette Dragon, MD;  Location: [facility] ENDOSCOPY;  Service: Endoscopy;  Laterality: N/A;  . ESOPHAGOGASTRODUODENOSCOPY N/A 09/27/2012   Procedure: ESOPHAGOGASTRODUODENOSCOPY (EGD);  Surgeon: Lafayette Dragon, MD;  Location: [facility];  Service:  Endoscopy;  Laterality: N/A;  . KNEE ARTHROSCOPY W/ MENISCAL REPAIR Left 2009  . LUMBAR DISC SURGERY    . LYMPH NODE DISSECTION Right 1970's   "neck; dr thought I had Hodgkins; turned out to be sarcoidosis" (11/15/2012)  . squamous papilloma   2010   removed by Dr. Constance Holster ENT, noted on tongue  . TONSILLECTOMY       reports that he has been smoking cigarettes. He has a 15.00 pack-year smoking history. He has never used smokeless tobacco. He reports that he does not drink alcohol or use drugs.  Allergies  Allergen Reactions  . Immune Globulins Other (See Comments)    Acute renal failure  . Rho (D) Immune Globulin     Acute renal failure Acute  renal failure  . Ciprofloxacin Swelling  . Flagyl [Metronidazole] Swelling  . Lisinopril Diarrhea  . Sulfa Antibiotics Other (See Comments)    blisters  . Sulfasalazine     blisters blisters  . Cefepime Other (See Comments)    Diffuse maculopapular rash when receiving both vanc and cefepime, timing unclear.  . Morphine And Related Other (See Comments)    Ineffective for pain management  . Requip [Ropinirole Hcl] Other (See Comments)    constipation  . Vancomycin     Diffuse maculopapular rash when receiving both vanc/cefepime.  Timing unclear.    . Betamethasone Dipropionate Other (See Comments)    Unknown  . Betamethasone Dipropionate     Unknown  . Bupropion     Unknown  . Bupropion Hcl Other (See Comments)    Unknown  . Clobetasol Other (See Comments)    Unknown Unknown  . Codeine Other (See Comments)    Does not help with pain  . Escitalopram Oxalate Other (See Comments)    Unknown  . Escitalopram Oxalate     Unknown  . Fluoxetine Hcl Other (See Comments)    Unknown  . Fluoxetine Hcl     Unknown  . Furosemide Other (See Comments)    Unknown Unknown  . Meclizine Rash  . Paroxetine Other (See Comments)    Unknown  . Penicillins Other (See Comments)    Unknown reaction Has patient had a PCN reaction causing immediate rash, facial/tongue/throat swelling, SOB or lightheadedness with hypotension: unknown Has patient had a PCN reaction causing severe rash involving mucus membranes or skin necrosis: unknown Has patient had a PCN reaction that required hospitalization unknown Has patient had a PCN reaction occurring within the last 10 years: unknown If all of the above answers are "NO", then may proceed with Cephalosporin use. Unknown Unknown reactio  . Tacrolimus Other (See Comments)    Unknown  . Tetanus Toxoid Other (See Comments)    Unknown  . Tuberculin Purified Protein Derivative Other (See Comments)    Unknown    Family History  Problem Relation Age of  Onset  . Alcohol abuse Mother   . Depression Mother   . Heart disease Mother   . Diabetes Mother   . Stroke Mother   . Coronary artery disease Father   . Diabetes Other        1/2 brother  . Hepatitis Brother      Prior to Admission medications   Medication Sig Start Date End Date Taking? Authorizing Provider  ALPRAZolam Duanne Moron) 0.5 MG tablet Take 1 tablet (0.5 mg total) by mouth 3 (three) times daily. 10/14/17  Yes Elgergawy, Silver Huguenin, MD  amiodarone (PACERONE) 200 MG tablet Take 1 tablet (200 mg total) by  mouth daily. 09/07/17  Yes Desiree Hane, MD  apixaban (ELIQUIS) 5 MG TABS tablet Take 10 mg oral twice daily for total of  days, then transition to 5 mg oral twice daily on 10/21/2017 10/18/17  Yes Elgergawy, Silver Huguenin, MD  atorvastatin (LIPITOR) 40 MG tablet Take 1 tablet (40 mg total) by mouth daily. 09/07/17  Yes Oretha Milch D, MD  budesonide-formoterol (SYMBICORT) 160-4.5 MCG/ACT inhaler Inhale 2 puffs into the lungs 2 (two) times daily. 09/07/17  Yes Oretha Milch D, MD  divalproex (DEPAKOTE) 250 MG DR tablet Take 1 tablet (250 mg total) by mouth 3 (three) times daily. 09/07/17  Yes Oretha Milch D, MD  ergocalciferol (VITAMIN D2) 50000 units capsule Take 1 capsule (50,000 Units total) by mouth once a week. 09/07/17  Yes Oretha Milch D, MD  esomeprazole (NEXIUM) 40 MG capsule Take 1 capsule (40 mg total) by mouth daily. 09/07/17  Yes Oretha Milch D, MD  guaiFENesin (MUCINEX) 600 MG 12 hr tablet Take 2 tablets (1,200 mg total) by mouth 2 (two) times daily. 10/18/17  Yes Elgergawy, Silver Huguenin, MD  ipratropium-albuterol (DUONEB) 0.5-2.5 (3) MG/3ML SOLN Inhale 3 mLs into the lungs every 6 (six) hours as needed. Patient taking differently: Inhale 3 mLs into the lungs every 6 (six) hours as needed (wheezing).  09/07/17  Yes Oretha Milch D, MD  levETIRAcetam (KEPPRA) 500 MG tablet Take 1 tablet (500 mg total) by mouth 2 (two) times daily. 09/07/17  Yes Oretha Milch D, MD  loperamide  (IMODIUM) 2 MG capsule Take 1 capsule (2 mg total) by mouth as needed for diarrhea or loose stools. 09/07/17  Yes Oretha Milch D, MD  Multiple Vitamin (MULTIVITAMIN WITH MINERALS) TABS tablet Take 1 tablet by mouth daily. 09/07/17  Yes Oretha Milch D, MD  nicotine (NICODERM CQ - DOSED IN MG/24 HOURS) 21 mg/24hr patch Place 1 patch (21 mg total) onto the skin daily. 10/15/17  Yes Elgergawy, Silver Huguenin, MD  oxybutynin (DITROPAN) 5 MG tablet Take 1 tablet (5 mg total) by mouth 3 (three) times daily. 09/07/17  Yes Oretha Milch D, MD  Potassium Chloride ER 20 MEQ TBCR Take 20 mEq by mouth every other day. 09/20/17  Yes Patrecia Pour, MD  predniSONE (DELTASONE) 10 MG tablet Take 0.5 tablets (5 mg total) by mouth daily with breakfast. 10/18/17  Yes Elgergawy, Silver Huguenin, MD  risperiDONE (RISPERDAL) 1 MG tablet Take 1 tablet (1 mg total) by mouth 2 (two) times daily. 09/07/17  Yes Oretha Milch D, MD  senna-docusate (SENOKOT-S) 8.6-50 MG tablet Take 1 tablet by mouth at bedtime as needed for mild constipation. 09/07/17  Yes Oretha Milch D, MD    Physical Exam: Vitals:   10/31/17 2200 10/31/17 2230 10/31/17 2300 11/01/17 0000  BP: 115/80 112/75 98/73 100/70  Pulse: 90 86 83 82  Resp: (!) 21 18 17 10   Temp:      SpO2: 97% 95% 97% 95%    Constitutional: NAD, calm, comfortable Eyes: PERRL, lids and conjunctivae normal ENMT: Mucous membranes are moist. Posterior pharynx clear of any exudate or lesions.Normal dentition.  Neck: normal, supple, no masses, no thyromegaly Respiratory: clear to auscultation bilaterally, no wheezing, no crackles. Normal respiratory effort. No accessory muscle use.  Cardiovascular: Regular rate and rhythm, no murmurs / rubs / gallops. No extremity edema. 2+ pedal pulses. No carotid bruits.  Abdomen: no tenderness, no masses palpated. No hepatosplenomegaly. Bowel sounds positive.  Musculoskeletal: no clubbing / cyanosis. No joint deformity upper and lower  extremities. Good ROM, no  contractures. Normal muscle tone.  Skin: Bruising left upper arm, and left chest wall. Neurologic: Poor effort, but minimal movement in R arm, doesn't move L arm, doesn't move either leg.  Speech is clear, tongue protrusion midline, no facial droop.  Mild B resting tremor and mouth tremor. Psychiatric: Flat affect.  AAO   Labs on Admission: I have personally reviewed following labs and imaging studies  CBC: Recent Labs  Lab 10/31/17 1459  WBC 13.6*  NEUTROABS 11.2*  HGB 14.3  HCT 44.5  MCV 91.6  PLT 474   Basic Metabolic Panel: Recent Labs  Lab 10/31/17 1459  NA 147*  K 3.5  CL 105  CO2 28  GLUCOSE 93  BUN 13  CREATININE 0.90  CALCIUM 9.5   GFR: CrCl cannot be calculated (Unknown ideal weight.). Liver Function Tests: Recent Labs  Lab 10/31/17 1459  AST 21  ALT 17  ALKPHOS 68  BILITOT 1.6*  PROT 7.0  ALBUMIN 3.3*   No results for input(s): LIPASE, AMYLASE in the last 168 hours. No results for input(s): AMMONIA in the last 168 hours. Coagulation Profile: No results for input(s): INR, PROTIME in the last 168 hours. Cardiac Enzymes: Recent Labs  Lab 10/31/17 1459  CKTOTAL 447*   BNP (last 3 results) No results for input(s): PROBNP in the last 8760 hours. HbA1C: No results for input(s): HGBA1C in the last 72 hours. CBG: No results for input(s): GLUCAP in the last 168 hours. Lipid Profile: No results for input(s): CHOL, HDL, LDLCALC, TRIG, CHOLHDL, LDLDIRECT in the last 72 hours. Thyroid Function Tests: No results for input(s): TSH, T4TOTAL, FREET4, T3FREE, THYROIDAB in the last 72 hours. Anemia Panel: No results for input(s): VITAMINB12, FOLATE, FERRITIN, TIBC, IRON, RETICCTPCT in the last 72 hours. Urine analysis:    Component Value Date/Time   COLORURINE YELLOW 10/31/2017 [place] 10/31/2017 1524   LABSPEC 1.023 10/31/2017 1524   PHURINE 5.0 10/31/2017 1524   GLUCOSEU NEGATIVE 10/31/2017 1524   HGBUR SMALL (A) 10/31/2017 1524    HGBUR negative 12/11/2009 1356   BILIRUBINUR NEGATIVE 10/31/2017 1524   BILIRUBINUR MODERATE 06/05/2015 1407   KETONESUR 80 (A) 10/31/2017 1524   PROTEINUR 100 (A) 10/31/2017 1524   UROBILINOGEN 2.0 06/05/2015 1407   UROBILINOGEN 1.0 12/30/2014 1830   NITRITE NEGATIVE 10/31/2017 1524   LEUKOCYTESUR NEGATIVE 10/31/2017 1524    Radiological Exams on Admission: Dg Chest 2 View  Result Date: 10/31/2017 CLINICAL DATA:  Fall 2 days ago with persistent chest pain, initial encounter EXAM: CHEST - 2 VIEW COMPARISON:  10/14/2017 FINDINGS: Cardiac shadow is within normal limits. The lungs are well aerated bilaterally. Minimal right basilar atelectasis is seen. No pneumothorax is. Degenerative changes of the thoracic spine are seen. No definitive rib fractures are noted. IMPRESSION: Mild right basilar atelectasis. No pneumothorax or rib abnormality is seen. Electronically Signed   By: Inez Catalina M.D.   On: 10/31/2017 16:12   Dg Pelvis 1-2 Views  Result Date: 10/31/2017 CLINICAL DATA:  Recent fall 2 days ago with pelvic pain, initial encounter EXAM: PELVIS - 1 VIEW COMPARISON:  None. FINDINGS: Pelvic ring is intact. No acute fracture or dislocation is seen. No gross soft tissue abnormality is noted. IMPRESSION: No acute abnormality noted. Electronically Signed   By: Inez Catalina M.D.   On: 10/31/2017 16:14   Ct Head Wo Contrast  Result Date: 10/31/2017 CLINICAL DATA:  67 y/o M; fall to floor unable to move since Saturday.  EXAM: CT HEAD WITHOUT CONTRAST CT CERVICAL SPINE WITHOUT CONTRAST TECHNIQUE: Multidetector CT imaging of the head and cervical spine was performed following the standard protocol without intravenous contrast. Multiplanar CT image reconstructions of the cervical spine were also generated. COMPARISON:  09/17/2017 CT head and cervical spine. FINDINGS: CT HEAD FINDINGS Brain: No evidence of acute infarction, hemorrhage, hydrocephalus, extra-axial collection or mass lesion/mass effect. Stable  moderate chronic microvascular ischemic changes and volume loss of the brain. Stable chronic lacunar infarct within left paramedian pons. Vascular: Calcific atherosclerosis of carotid siphons. No hyperdense vessel. Skull: Normal. Negative for fracture or focal lesion. Sinuses/Orbits: Normal aeration of the visible paranasal sinuses and mastoid air cells. Left intra-ocular lens replacement. Other: Right-sided ossicular replacement prosthesis. CT CERVICAL SPINE FINDINGS Alignment: Normal. Skull base and vertebrae: No acute fracture. No primary bone lesion or focal pathologic process. Chronic T1 spinous process fracture. Soft tissues and spinal canal: No prevertebral fluid or swelling. No visible canal hematoma. Disc levels: Intervertebral disc space heights are maintained. Small endplate marginal osteophytes are present at the C5-6 level. Upper chest: Negative. Other: Mild calcific atherosclerosis of the carotid bifurcations. IMPRESSION: CT head: 1. No acute intracranial abnormality or calvarial fracture. 2. Stable chronic microvascular ischemic changes, volume loss of the brain, and small chronic left paramedian pontine infarct. CT cervical spine: No acute fracture or dislocation. Chronic T1 spinous process fracture. Electronically Signed   By: Kristine Garbe M.D.   On: 10/31/2017 16:24   Ct Cervical Spine Wo Contrast  Result Date: 10/31/2017 CLINICAL DATA:  67 y/o M; fall to floor unable to move since Saturday. EXAM: CT HEAD WITHOUT CONTRAST CT CERVICAL SPINE WITHOUT CONTRAST TECHNIQUE: Multidetector CT imaging of the head and cervical spine was performed following the standard protocol without intravenous contrast. Multiplanar CT image reconstructions of the cervical spine were also generated. COMPARISON:  09/17/2017 CT head and cervical spine. FINDINGS: CT HEAD FINDINGS Brain: No evidence of acute infarction, hemorrhage, hydrocephalus, extra-axial collection or mass lesion/mass effect. Stable  moderate chronic microvascular ischemic changes and volume loss of the brain. Stable chronic lacunar infarct within left paramedian pons. Vascular: Calcific atherosclerosis of carotid siphons. No hyperdense vessel. Skull: Normal. Negative for fracture or focal lesion. Sinuses/Orbits: Normal aeration of the visible paranasal sinuses and mastoid air cells. Left intra-ocular lens replacement. Other: Right-sided ossicular replacement prosthesis. CT CERVICAL SPINE FINDINGS Alignment: Normal. Skull base and vertebrae: No acute fracture. No primary bone lesion or focal pathologic process. Chronic T1 spinous process fracture. Soft tissues and spinal canal: No prevertebral fluid or swelling. No visible canal hematoma. Disc levels: Intervertebral disc space heights are maintained. Small endplate marginal osteophytes are present at the C5-6 level. Upper chest: Negative. Other: Mild calcific atherosclerosis of the carotid bifurcations. IMPRESSION: CT head: 1. No acute intracranial abnormality or calvarial fracture. 2. Stable chronic microvascular ischemic changes, volume loss of the brain, and small chronic left paramedian pontine infarct. CT cervical spine: No acute fracture or dislocation. Chronic T1 spinous process fracture. Electronically Signed   By: Kristine Garbe M.D.   On: 10/31/2017 16:24   Dg Shoulder Left  Result Date: 10/31/2017 CLINICAL DATA:  Fall 2 days ago with left shoulder pain, initial encounter EXAM: LEFT SHOULDER - 2+ VIEW COMPARISON:  06/26/2014 FINDINGS: Degenerative changes of the acromioclavicular joint are noted. No acute fracture or dislocation is seen. No soft tissue abnormality is noted. The underlying bony thorax is within normal limits. IMPRESSION: Mild degenerative change without acute abnormality. Electronically Signed   By: Elta [place]  Lukens M.D.   On: 10/31/2017 16:13    EKG: Independently reviewed.  Assessment/Plan Principal Problem:   Difficulty in walking, not elsewhere  classified Active Problems:   DEPRESSION, MAJOR   COPD (chronic obstructive pulmonary disease) (HCC)   Atrial fibrillation (HCC)   Seizure disorder (HCC)   History of pulmonary embolism   Left-sided weakness    1. Difficulty with walking - 1. Will get MRI to r/o stroke as cause of L sided weakness, obviously if positive then needs neuro consult, stroke work up and pathway, etc.  Although certainly not an intervention candidate with reported onset being Sat 9/14, on eliquis already, etc. 2. PT/OT eval 3. IVF: NS at 74 4. SW already seeing patient 2. COPD - cont home meds, no acute exacerbation 3. H/O PE end of last month - continue eliquis 4. Seizure disorder - cont home keppra and depakote 5. Depression - cont home meds 6. PAF - Cont amiodarone and eliquis  DVT prophylaxis: Eliquis Code Status: Full Family Communication: No family in room Disposition Plan: SNF after admit Consults called: None Admission status: Place in obs   Daniya Aramburo, [facility] Pager [phone] Only works nights!  If 7AM-7PM, please contact the primary day team physician taking care of patient  [email or web address] Password [facility]  11/01/2017, 12:24 AM

## 2017-11-01 NOTE — Progress Notes (Signed)
PT Cancellation Note  Patient Details Name: Patrick Holt MRN: [id number] DOB: 19-Jan-1951   Cancelled Treatment:    Reason Eval/Treat Not Completed: Patient at procedure or test/unavailable;Patient not medically ready - Pt just got back to room for MRI for neuro work up, awaiting test results. Will follow up with pt as schedule allows after MRI results and neuro consult. RN states pt is also very drowsy. Will continue to follow acutely.   Julien Girt, PT Acute Rehabilitation Services Pager [phone]  Office [phone]    Roxine Caddy D Elonda Husky 11/01/2017, 12:54 PM

## 2017-11-01 NOTE — Progress Notes (Signed)
EEG completed, results pending. 

## 2017-11-02 ENCOUNTER — Inpatient Hospital Stay (HOSPITAL_COMMUNITY): Payer: Medicare Other

## 2017-11-02 ENCOUNTER — Observation Stay (HOSPITAL_COMMUNITY): Payer: Medicare Other

## 2017-11-02 DIAGNOSIS — I4891 Unspecified atrial fibrillation: Secondary | ICD-10-CM | POA: Diagnosis not present

## 2017-11-02 DIAGNOSIS — F449 Dissociative and conversion disorder, unspecified: Secondary | ICD-10-CM | POA: Diagnosis present

## 2017-11-02 DIAGNOSIS — J410 Simple chronic bronchitis: Secondary | ICD-10-CM | POA: Diagnosis not present

## 2017-11-02 DIAGNOSIS — B370 Candidal stomatitis: Secondary | ICD-10-CM | POA: Diagnosis not present

## 2017-11-02 DIAGNOSIS — R531 Weakness: Secondary | ICD-10-CM | POA: Diagnosis present

## 2017-11-02 DIAGNOSIS — E1142 Type 2 diabetes mellitus with diabetic polyneuropathy: Secondary | ICD-10-CM | POA: Diagnosis present

## 2017-11-02 DIAGNOSIS — J449 Chronic obstructive pulmonary disease, unspecified: Secondary | ICD-10-CM | POA: Diagnosis not present

## 2017-11-02 DIAGNOSIS — F429 Obsessive-compulsive disorder, unspecified: Secondary | ICD-10-CM | POA: Diagnosis present

## 2017-11-02 DIAGNOSIS — G40909 Epilepsy, unspecified, not intractable, without status epilepticus: Secondary | ICD-10-CM | POA: Diagnosis not present

## 2017-11-02 DIAGNOSIS — I361 Nonrheumatic tricuspid (valve) insufficiency: Secondary | ICD-10-CM

## 2017-11-02 DIAGNOSIS — I480 Paroxysmal atrial fibrillation: Secondary | ICD-10-CM

## 2017-11-02 DIAGNOSIS — I2692 Saddle embolus of pulmonary artery without acute cor pulmonale: Secondary | ICD-10-CM | POA: Diagnosis not present

## 2017-11-02 DIAGNOSIS — I5032 Chronic diastolic (congestive) heart failure: Secondary | ICD-10-CM | POA: Diagnosis not present

## 2017-11-02 DIAGNOSIS — Z86711 Personal history of pulmonary embolism: Secondary | ICD-10-CM

## 2017-11-02 DIAGNOSIS — R262 Difficulty in walking, not elsewhere classified: Secondary | ICD-10-CM | POA: Diagnosis not present

## 2017-11-02 DIAGNOSIS — M7989 Other specified soft tissue disorders: Secondary | ICD-10-CM | POA: Diagnosis not present

## 2017-11-02 DIAGNOSIS — W19XXXA Unspecified fall, initial encounter: Secondary | ICD-10-CM | POA: Diagnosis present

## 2017-11-02 DIAGNOSIS — E785 Hyperlipidemia, unspecified: Secondary | ICD-10-CM | POA: Diagnosis present

## 2017-11-02 DIAGNOSIS — I7300 Raynaud's syndrome without gangrene: Secondary | ICD-10-CM | POA: Diagnosis present

## 2017-11-02 DIAGNOSIS — J690 Pneumonitis due to inhalation of food and vomit: Secondary | ICD-10-CM | POA: Diagnosis not present

## 2017-11-02 DIAGNOSIS — R609 Edema, unspecified: Secondary | ICD-10-CM | POA: Diagnosis not present

## 2017-11-02 DIAGNOSIS — Z9981 Dependence on supplemental oxygen: Secondary | ICD-10-CM | POA: Diagnosis not present

## 2017-11-02 DIAGNOSIS — K219 Gastro-esophageal reflux disease without esophagitis: Secondary | ICD-10-CM | POA: Diagnosis present

## 2017-11-02 DIAGNOSIS — W19XXXS Unspecified fall, sequela: Secondary | ICD-10-CM

## 2017-11-02 DIAGNOSIS — I259 Chronic ischemic heart disease, unspecified: Secondary | ICD-10-CM | POA: Diagnosis present

## 2017-11-02 DIAGNOSIS — Y92002 Bathroom of unspecified non-institutional (private) residence single-family (private) house as the place of occurrence of the external cause: Secondary | ICD-10-CM | POA: Diagnosis not present

## 2017-11-02 DIAGNOSIS — R0902 Hypoxemia: Secondary | ICD-10-CM | POA: Diagnosis not present

## 2017-11-02 DIAGNOSIS — G20 Parkinson's disease: Secondary | ICD-10-CM | POA: Diagnosis present

## 2017-11-02 DIAGNOSIS — I110 Hypertensive heart disease with heart failure: Secondary | ICD-10-CM | POA: Diagnosis present

## 2017-11-02 DIAGNOSIS — T07XXXA Unspecified multiple injuries, initial encounter: Secondary | ICD-10-CM | POA: Diagnosis not present

## 2017-11-02 DIAGNOSIS — F319 Bipolar disorder, unspecified: Secondary | ICD-10-CM | POA: Diagnosis present

## 2017-11-02 DIAGNOSIS — G9341 Metabolic encephalopathy: Secondary | ICD-10-CM | POA: Diagnosis not present

## 2017-11-02 DIAGNOSIS — I6381 Other cerebral infarction due to occlusion or stenosis of small artery: Secondary | ICD-10-CM | POA: Diagnosis not present

## 2017-11-02 DIAGNOSIS — F329 Major depressive disorder, single episode, unspecified: Secondary | ICD-10-CM

## 2017-11-02 DIAGNOSIS — F1120 Opioid dependence, uncomplicated: Secondary | ICD-10-CM | POA: Diagnosis not present

## 2017-11-02 DIAGNOSIS — D869 Sarcoidosis, unspecified: Secondary | ICD-10-CM | POA: Diagnosis present

## 2017-11-02 DIAGNOSIS — A419 Sepsis, unspecified organism: Secondary | ICD-10-CM | POA: Diagnosis not present

## 2017-11-02 DIAGNOSIS — W010XXA Fall on same level from slipping, tripping and stumbling without subsequent striking against object, initial encounter: Secondary | ICD-10-CM | POA: Diagnosis present

## 2017-11-02 LAB — CBC WITH DIFFERENTIAL/PLATELET
BASOS ABS: 0 10*3/uL (ref 0.0–0.1)
BASOS PCT: 0 %
Eosinophils Absolute: 0.1 10*3/uL (ref 0.0–0.7)
Eosinophils Relative: 1 %
HEMATOCRIT: 37.5 % — AB (ref 39.0–52.0)
HEMOGLOBIN: 11.7 g/dL — AB (ref 13.0–17.0)
Lymphocytes Relative: 32 %
Lymphs Abs: 2.4 10*3/uL (ref 0.7–4.0)
MCH: 29.1 pg (ref 26.0–34.0)
MCHC: 31.2 g/dL (ref 30.0–36.0)
MCV: 93.3 fL (ref 78.0–100.0)
MONOS PCT: 11 %
Monocytes Absolute: 0.8 10*3/uL (ref 0.1–1.0)
NEUTROS ABS: 4.2 10*3/uL (ref 1.7–7.7)
NEUTROS PCT: 56 %
Platelets: 304 10*3/uL (ref 150–400)
RBC: 4.02 MIL/uL — ABNORMAL LOW (ref 4.22–5.81)
RDW: 20.1 % — ABNORMAL HIGH (ref 11.5–15.5)
WBC: 7.5 10*3/uL (ref 4.0–10.5)

## 2017-11-02 LAB — COMPREHENSIVE METABOLIC PANEL
ALBUMIN: 2.3 g/dL — AB (ref 3.5–5.0)
ALK PHOS: 50 U/L (ref 38–126)
ALT: 17 U/L (ref 0–44)
AST: 20 U/L (ref 15–41)
Anion gap: 8 (ref 5–15)
BILIRUBIN TOTAL: 0.8 mg/dL (ref 0.3–1.2)
BUN: 15 mg/dL (ref 8–23)
CALCIUM: 8.7 mg/dL — AB (ref 8.9–10.3)
CO2: 26 mmol/L (ref 22–32)
CREATININE: 0.82 mg/dL (ref 0.61–1.24)
Chloride: 109 mmol/L (ref 98–111)
GFR calc Af Amer: >60 mL/min (ref >60)
GLUCOSE: 92 mg/dL (ref 70–99)
Potassium: 3.9 mmol/L (ref 3.5–5.1)
Sodium: 143 mmol/L (ref 135–145)
TOTAL PROTEIN: 5.3 g/dL — AB (ref 6.5–8.1)

## 2017-11-02 LAB — LEVETIRACETAM LEVEL: Levetiracetam Lvl: 8.3 ug/mL — ABNORMAL LOW (ref 10.0–40.0)

## 2017-11-02 LAB — HEPARIN LEVEL (UNFRACTIONATED)
Heparin Unfractionated: 1.24 IU/mL — ABNORMAL HIGH (ref 0.30–0.70)
Heparin Unfractionated: 0.97 IU/mL — ABNORMAL HIGH (ref 0.30–0.70)

## 2017-11-02 LAB — MAGNESIUM: Magnesium: 1.9 mg/dL (ref 1.7–2.4)

## 2017-11-02 LAB — ECHOCARDIOGRAM COMPLETE
HEIGHTINCHES: 63 in
WEIGHTICAEL: 3376 [oz_av]

## 2017-11-02 LAB — HEMOGLOBIN A1C
Hgb A1c MFr Bld: 5.4 % (ref 4.8–5.6)
MEAN PLASMA GLUCOSE: 108 mg/dL

## 2017-11-02 LAB — APTT
aPTT: 102 seconds — ABNORMAL HIGH (ref 24–36)
aPTT: 80 seconds — ABNORMAL HIGH (ref 24–36)

## 2017-11-02 LAB — PHOSPHORUS: Phosphorus: 3.4 mg/dL (ref 2.5–4.6)

## 2017-11-02 MED ORDER — PANTOPRAZOLE SODIUM 40 MG PO TBEC
40.0000 mg | DELAYED_RELEASE_TABLET | Freq: Every day | ORAL | Status: DC
  Administered 2017-11-03 – 2017-11-10 (×7): 40 mg via ORAL
  Filled 2017-11-02 (×7): qty 1

## 2017-11-02 MED ORDER — DIVALPROEX SODIUM 250 MG PO DR TAB
250.0000 mg | DELAYED_RELEASE_TABLET | Freq: Three times a day (TID) | ORAL | Status: DC
  Administered 2017-11-02 – 2017-11-08 (×17): 250 mg via ORAL
  Filled 2017-11-02 (×17): qty 1

## 2017-11-02 MED ORDER — HEPARIN (PORCINE) IN NACL 100-0.45 UNIT/ML-% IJ SOLN
1100.0000 [IU]/h | INTRAMUSCULAR | Status: AC
  Filled 2017-11-02: qty 250

## 2017-11-02 MED ORDER — LEVETIRACETAM 500 MG PO TABS
500.0000 mg | ORAL_TABLET | Freq: Two times a day (BID) | ORAL | Status: DC
  Administered 2017-11-02 – 2017-11-08 (×12): 500 mg via ORAL
  Filled 2017-11-02 (×12): qty 1

## 2017-11-02 MED ORDER — APIXABAN 5 MG PO TABS
5.0000 mg | ORAL_TABLET | Freq: Two times a day (BID) | ORAL | Status: DC
  Administered 2017-11-02 – 2017-11-10 (×15): 5 mg via ORAL
  Filled 2017-11-02 (×15): qty 1

## 2017-11-02 MED ORDER — PERFLUTREN LIPID MICROSPHERE
1.0000 mL | INTRAVENOUS | Status: AC | PRN
  Administered 2017-11-02: 3 mL via INTRAVENOUS
  Filled 2017-11-02: qty 10

## 2017-11-02 NOTE — Progress Notes (Signed)
OT Cancellation Note  Patient Details Name: Patrick Holt MRN: [id number] DOB: November 18, 1950   Cancelled Treatment:    Reason Eval/Treat Not Completed: Fatigue/lethargy limiting ability to participate  [place] 11/02/2017, 12:49 PM  Lesle Chris, OTR/L Acute Rehabilitation Services [phone] [facility] pager [phone] office 11/02/2017

## 2017-11-02 NOTE — Consult Note (Signed)
Reason for Consult: Left upper extremity and shoulder pain after recent fall and prolonged immobilization. Referring Physician: Dr. Hartford Poli DREYSON MISHKIN is an 67 y.o. male.  HPI: Patient was recently admitted to the hospital after being found down in his home.  Patient states he fell on Saturday and was not discovered until Monday.  He was discovered by his caretaker and 911 was called he was transferred to the hospital.  He states when he was down he was having difficulty moving his left upper extremity and left lower extremity.  Since admission to the hospital he was diagnosed as having had a CVA.  He continues to have difficulty moving his left upper and lower extremities.  He has not been working well with physical therapy due to this.  He recently had an EEG to ensure that he was not in a chronic seizure event and this was negative.  Orthopedics was consulted to rule out any underlying musculoskeletal issue as a cause for his shoulder weakness.  Patient states he has pain from his shoulder down to his elbow on the left.  He also has low back pain and leg pain from laying in the bed.  He states he is unable to actively move his left upper extremity.  This is mostly from the shoulder down to the elbow.  He also notes some weakness of the left lower extremity.  He feels more strong on the right side.  Patient states he has resting tremors about his extremities and is been told in the past by his family members that he should see neurology for possible Parkinson's disease.  He has never had this worked up.  2 views of the shoulder including AP and scapular Y view were positive for mild degenerative changes but no fracture dislocation.   Assessment/Plan: It appears Mr. Mathieson has some left-sided weakness.  He does have some tenderness to palpation about his shoulder and may have an underlying rotator cuff tear but it is unlikely given the global nature of his weakness.  If this is not related to his  stroke it could be similar to a Saturday night palsy from him being down so long.  I am not concerned for any areas of compartment syndrome.  I will order an axillary view of his shoulder to ensure there is no dislocation but have low suspicion for this.  I would not recommend an MRI of the left shoulder at this time as he also has difficulty moving his elbow and his left lower extremity.  If he continues to have significant amount of pain in his shoulder this may be warranted.  In the meantime would work on joint motion with physical therapy to ensure that he does not get stiff.  I will plan to follow-up on the results of the x-rays.    Past Medical History:  Diagnosis Date  . Anemia   . Anxiety   . Asthma   . Atrial fibrillation ([place]) 01/2012   with RVR  . Bipolar 1 disorder ([place])   . Cancer ([place])   . Collagen vascular disease ([place])   . Conversion disorder 06/2010  . COPD (chronic obstructive pulmonary disease) ([place])   . Dermatomyositis ([place])   . Dermatomyositis ([place])   . Diverticulitis   . Diverticulosis of colon   . Dysrhythmia    "irregular" (11/15/2012)  . Esophageal dysmotility   . Esophageal stricture   . Fibromyalgia   . Gastritis   . GERD (gastroesophageal reflux disease)   .  Hand fracture, right    sept 13, 2017, still wearing splint as og 01-10-2017  . Headache(784.0)    "severe; get them often" (11/15/2012)  . Hiatal hernia   . History of narcotic addiction ([place])   . Hx of adenomatous colonic polyps   . Hyperlipidemia   . Hypertension   . Internal hemorrhoids   . Ischemic heart disease   . Major depression    with acute psychotic break in 06/2010  . Myocardial infarct ([place])    mulitple (1999, 2000, 2004)  . Narcotic dependence ([place])   . Nephrolithiasis   . Obesity   . OCD (obsessive compulsive disorder)   . Otosclerosis   . Paroxysmal A-fib ([place])   . Peripheral neuropathy   . Raynaud's disease   . Renal insufficiency   . Rheumatoid arthritis(714.0)   .  Sarcoidosis   . Seizures ([place])   . Subarachnoid hemorrhage ([place]) 01/2012   with subdural  hematoma.   . Type II diabetes mellitus ([place])   . Urge incontinence   . Vertigo     Past Surgical History:  Procedure Laterality Date  . BACK SURGERY    . CARDIAC CATHETERIZATION    . CARPAL TUNNEL RELEASE Bilateral   . CATARACT EXTRACTION W/ INTRAOCULAR LENS IMPLANT Left   . COLONOSCOPY N/A 09/27/2012   Procedure: COLONOSCOPY;  Surgeon: Lafayette Dragon, MD;  Location: [facility];  Service: Endoscopy;  Laterality: N/A;  . ESOPHAGOGASTRODUODENOSCOPY N/A 09/27/2012   Procedure: ESOPHAGOGASTRODUODENOSCOPY (EGD);  Surgeon: Lafayette Dragon, MD;  Location: [facility];  Service: Endoscopy;  Laterality: N/A;  . KNEE ARTHROSCOPY W/ MENISCAL REPAIR Left 2009  . LUMBAR DISC SURGERY    . LYMPH NODE DISSECTION Right 1970's   "neck; dr thought I had Hodgkins; turned out to be sarcoidosis" (11/15/2012)  . squamous papilloma   2010   removed by Dr. Constance Holster ENT, noted on tongue  . TONSILLECTOMY      Family History  Problem Relation Age of Onset  . Alcohol abuse Mother   . Depression Mother   . Heart disease Mother   . Diabetes Mother   . Stroke Mother   . Coronary artery disease Father   . Diabetes Other        1/2 brother  . Hepatitis Brother     Social History:  reports that he has been smoking cigarettes. He has a 15.00 pack-year smoking history. He has never used smokeless tobacco. He reports that he does not drink alcohol or use drugs.  Allergies:  Allergies  Allergen Reactions  . Immune Globulins Other (See Comments)    Acute renal failure  . Rho (D) Immune Globulin     Acute renal failure Acute renal failure  . Ciprofloxacin Swelling  . Flagyl [Metronidazole] Swelling  . Lisinopril Diarrhea  . Sulfa Antibiotics Other (See Comments)    blisters  . Sulfasalazine     blisters blisters  . Cefepime Other (See Comments)    Diffuse maculopapular rash when receiving both vanc and cefepime,  timing unclear.  . Morphine And Related Other (See Comments)    Ineffective for pain management  . Requip [Ropinirole Hcl] Other (See Comments)    constipation  . Vancomycin     Diffuse maculopapular rash when receiving both vanc/cefepime.  Timing unclear.    . Betamethasone Dipropionate Other (See Comments)    Unknown  . Betamethasone Dipropionate     Unknown  . Bupropion     Unknown  . Bupropion Hcl Other (  See Comments)    Unknown  . Clobetasol Other (See Comments)    Unknown Unknown  . Codeine Other (See Comments)    Does not help with pain  . Escitalopram Oxalate Other (See Comments)    Unknown  . Escitalopram Oxalate     Unknown  . Fluoxetine Hcl Other (See Comments)    Unknown  . Fluoxetine Hcl     Unknown  . Furosemide Other (See Comments)    Unknown Unknown  . Meclizine Rash  . Paroxetine Other (See Comments)    Unknown  . Penicillins Other (See Comments)    Unknown reaction Has patient had a PCN reaction causing immediate rash, facial/tongue/throat swelling, SOB or lightheadedness with hypotension: unknown Has patient had a PCN reaction causing severe rash involving mucus membranes or skin necrosis: unknown Has patient had a PCN reaction that required hospitalization unknown Has patient had a PCN reaction occurring within the last 10 years: unknown If all of the above answers are "NO", then may proceed with Cephalosporin use. Unknown Unknown reactio  . Tacrolimus Other (See Comments)    Unknown  . Tetanus Toxoid Other (See Comments)    Unknown  . Tuberculin Purified Protein Derivative Other (See Comments)    Unknown    Medications: I have reviewed the patient's current medications.  Results for orders placed or performed during the hospital encounter of 10/31/17 (from the past 48 hour(s))  Urinalysis, Routine w reflex microscopic     Status: Abnormal   Collection Time: 10/31/17  3:24 PM  Result Value Ref Range   Color, Urine YELLOW YELLOW    APPearance CLEAR CLEAR   Specific Gravity, Urine 1.023 1.005 - 1.030   pH 5.0 5.0 - 8.0   Glucose, UA NEGATIVE NEGATIVE mg/dL   Hgb urine dipstick SMALL (A) NEGATIVE   Bilirubin Urine NEGATIVE NEGATIVE   Ketones, ur 80 (A) NEGATIVE mg/dL   Protein, ur 100 (A) NEGATIVE mg/dL   Nitrite NEGATIVE NEGATIVE   Leukocytes, UA NEGATIVE NEGATIVE   RBC / HPF 0-5 0 - 5 RBC/hpf   WBC, UA 6-10 0 - 5 WBC/hpf   Bacteria, UA RARE (A) NONE SEEN   Squamous Epithelial / LPF 0-5 0 - 5   Mucus PRESENT    Hyaline Casts, UA PRESENT    Granular Casts, UA PRESENT     Comment: Performed at [facility], [place] [name] [place], [place] [address]  CBC with Differential/Platelet     Status: Abnormal   Collection Time: 11/01/17  8:37 AM  Result Value Ref Range   WBC 8.7 4.0 - 10.5 K/uL   RBC 4.31 4.22 - 5.81 MIL/uL   Hemoglobin 12.7 (L) 13.0 - 17.0 g/dL   HCT 39.6 39.0 - 52.0 %   MCV 91.9 78.0 - 100.0 fL   MCH 29.5 26.0 - 34.0 pg   MCHC 32.1 30.0 - 36.0 g/dL   RDW 19.9 (H) 11.5 - 15.5 %   Platelets 314 150 - 400 K/uL   Neutrophils Relative % 72 %   Neutro Abs 6.3 1.7 - 7.7 K/uL   Lymphocytes Relative 16 %   Lymphs Abs 1.3 0.7 - 4.0 K/uL   Monocytes Relative 12 %   Monocytes Absolute 1.0 0.1 - 1.0 K/uL   Eosinophils Relative 0 %   Eosinophils Absolute 0.0 0.0 - 0.7 K/uL   Basophils Relative 0 %   Basophils Absolute 0.0 0.0 - 0.1 K/uL    Comment: Performed at [facility],  [place] [address] [place], [place] [address]  Comprehensive metabolic panel     Status: Abnormal   Collection Time: 11/01/17  8:37 AM  Result Value Ref Range   Sodium 145 135 - 145 mmol/L   Potassium 3.3 (L) 3.5 - 5.1 mmol/L   Chloride 109 98 - 111 mmol/L   CO2 27 22 - 32 mmol/L   Glucose, Bld 101 (H) 70 - 99 mg/dL   BUN 16 8 - 23 mg/dL   Creatinine, Ser 0.91 0.61 - 1.24 mg/dL   Calcium 8.9 8.9 - 10.3 mg/dL   Total Protein 5.8 (L) 6.5 - 8.1 g/dL   Albumin 2.6 (L) 3.5 - 5.0 g/dL   AST 22 15  - 41 U/L   ALT 17 0 - 44 U/L   Alkaline Phosphatase 55 38 - 126 U/L   Total Bilirubin 1.2 0.3 - 1.2 mg/dL   GFR calc non Af Amer >60 >60 mL/min   GFR calc Af Amer >60 >60 mL/min    Comment: (NOTE) The eGFR has been calculated using the CKD EPI equation. This calculation has not been validated in all clinical situations. eGFR's persistently <60 mL/min signify possible Chronic Kidney Disease.    Anion gap 9 5 - 15    Comment: Performed at [facility], [place] [address] [place], [place] [address]  Magnesium     Status: None   Collection Time: 11/01/17  8:37 AM  Result Value Ref Range   Magnesium 1.9 1.7 - 2.4 mg/dL    Comment: Performed at [facility], [place] [address] [place], [place] [address]  Phosphorus     Status: None   Collection Time: 11/01/17  8:37 AM  Result Value Ref Range   Phosphorus 3.9 2.5 - 4.6 mg/dL    Comment: Performed at [facility], [place] [address] [place], [place] [address]  Valproic acid level     Status: Abnormal   Collection Time: 11/01/17 12:38 PM  Result Value Ref Range   Valproic Acid Lvl 10 (L) 50.0 - 100.0 ug/mL    Comment: Performed at [facility], [place] [address] [place], [place] [address]  Lipid panel     Status: Abnormal   Collection Time: 11/01/17  2:47 PM  Result Value Ref Range   Cholesterol 97 0 - 200 mg/dL   Triglycerides 59 <150 mg/dL   HDL 38 (L) >40 mg/dL   Total CHOL/HDL Ratio 2.6 RATIO   VLDL 12 0 - 40 mg/dL   LDL Cholesterol 47 0 - 99 mg/dL    Comment:        Total Cholesterol/HDL:CHD Risk Coronary Heart Disease Risk Table                     Men   Women  1/2 Average Risk   3.4   3.3  Average Risk       5.0   4.4  2 X Average Risk   9.6   7.1  3 X Average Risk  23.4   11.0        Use the calculated Patient Ratio above and the CHD Risk Table to determine the patient's CHD Risk.        ATP III CLASSIFICATION (LDL):  <100     mg/dL   Optimal  100-129   mg/dL   Near or Above  Optimal  130-159  mg/dL   Borderline  160-189  mg/dL   High  >190     mg/dL   Very High Performed at [place] [address], [place], [place] [address]   Hemoglobin A1c     Status: None   Collection Time: 11/01/17  2:47 PM  Result Value Ref Range   Hgb A1c MFr Bld 5.4 4.8 - 5.6 %    Comment: (NOTE)         Prediabetes: 5.7 - 6.4         Diabetes: >6.4         Glycemic control for adults with diabetes: <7.0    Mean Plasma Glucose 108 mg/dL    Comment: (NOTE) Performed At: [facility] [address] [place], [place] [id number] Rush Farmer MD SR:[id number]   Heparin level (unfractionated)     Status: Abnormal   Collection Time: 11/01/17 10:14 PM  Result Value Ref Range   Heparin Unfractionated 1.24 (H) 0.30 - 0.70 IU/mL    Comment: RESULTS CONFIRMED BY MANUAL DILUTION Performed at [facility], [place] [address], [place], [place] [address]   Ammonia     Status: Abnormal   Collection Time: 11/01/17 10:14 PM  Result Value Ref Range   Ammonia 43 (H) 9 - 35 umol/L    Comment: Performed at [facility], [place] [address], [place], [place] [address]  APTT     Status: Abnormal   Collection Time: 11/01/17 10:14 PM  Result Value Ref Range   aPTT 74 (H) 24 - 36 seconds    Comment:        IF BASELINE aPTT IS ELEVATED, SUGGEST PATIENT RISK ASSESSMENT BE USED TO DETERMINE APPROPRIATE ANTICOAGULANT THERAPY. Performed at [facility], [place] [address] [place],  [address]   CBC with Differential/Platelet     Status: Abnormal   Collection Time: 11/02/17  5:49 AM  Result Value Ref Range   WBC 7.5 4.0 - 10.5 K/uL   RBC 4.02 (L) 4.22 - 5.81 MIL/uL   Hemoglobin 11.7 (L) 13.0 - 17.0 g/dL   HCT 37.5 (L) 39.0 - 52.0 %   MCV 93.3 78.0 - 100.0 fL   MCH 29.1 26.0 - 34.0 pg   MCHC 31.2 30.0 - 36.0 g/dL   RDW 20.1 (H) 11.5 - 15.5 %   Platelets 304 150 - 400  K/uL   Neutrophils Relative % 56 %   Neutro Abs 4.2 1.7 - 7.7 K/uL   Lymphocytes Relative 32 %   Lymphs Abs 2.4 0.7 - 4.0 K/uL   Monocytes Relative 11 %   Monocytes Absolute 0.8 0.1 - 1.0 K/uL   Eosinophils Relative 1 %   Eosinophils Absolute 0.1 0.0 - 0.7 K/uL   Basophils Relative 0 %   Basophils Absolute 0.0 0.0 - 0.1 K/uL    Comment: Performed at [facility], [place] [address], [place],  [address]  Comprehensive metabolic panel     Status: Abnormal   Collection Time: 11/02/17  5:49 AM  Result Value Ref Range   Sodium 143 135 - 145 mmol/L   Potassium 3.9 3.5 - 5.1 mmol/L   Chloride 109 98 - 111 mmol/L   CO2 26 22 - 32 mmol/L   Glucose, Bld 92 70 - 99 mg/dL   BUN 15 8 - 23 mg/dL   Creatinine, Ser 0.82 0.61 - 1.24 mg/dL   Calcium 8.7 (L) 8.9 - 10.3 mg/dL  Total Protein 5.3 (L) 6.5 - 8.1 g/dL   Albumin 2.3 (L) 3.5 - 5.0 g/dL   AST 20 15 - 41 U/L   ALT 17 0 - 44 U/L   Alkaline Phosphatase 50 38 - 126 U/L   Total Bilirubin 0.8 0.3 - 1.2 mg/dL   GFR calc non Af Amer >60 >60 mL/min   GFR calc Af Amer >60 >60 mL/min    Comment: (NOTE) The eGFR has been calculated using the CKD EPI equation. This calculation has not been validated in all clinical situations. eGFR's persistently <60 mL/min signify possible Chronic Kidney Disease.    Anion gap 8 5 - 15    Comment: Performed at [facility], [place] [address] [place], [place] [address]  Magnesium     Status: None   Collection Time: 11/02/17  5:49 AM  Result Value Ref Range   Magnesium 1.9 1.7 - 2.4 mg/dL    Comment: Performed at [facility], [place] [address] [place], [place] [address]  Phosphorus     Status: None   Collection Time: 11/02/17  5:49 AM  Result Value Ref Range   Phosphorus 3.4 2.5 - 4.6 mg/dL    Comment: Performed at [facility], [place] [address] [place], [place] [address]  Heparin level (unfractionated)     Status: Abnormal    Collection Time: 11/02/17  5:49 AM  Result Value Ref Range   Heparin Unfractionated 0.97 (H) 0.30 - 0.70 IU/mL    Comment: (NOTE) If heparin results are below expected values, and patient dosage has  been confirmed, suggest follow up testing of antithrombin III levels. Performed at [facility], [place] [address], [place], [place] [address]   APTT     Status: Abnormal   Collection Time: 11/02/17  5:49 AM  Result Value Ref Range   aPTT 102 (H) 24 - 36 seconds    Comment:        IF BASELINE aPTT IS ELEVATED, SUGGEST PATIENT RISK ASSESSMENT BE USED TO DETERMINE APPROPRIATE ANTICOAGULANT THERAPY. Performed at [facility], [place] [address], [place], [place] [address]   APTT     Status: Abnormal   Collection Time: 11/02/17  2:32 PM  Result Value Ref Range   aPTT 80 (H) 24 - 36 seconds    Comment:        IF BASELINE aPTT IS ELEVATED, SUGGEST PATIENT RISK ASSESSMENT BE USED TO DETERMINE APPROPRIATE ANTICOAGULANT THERAPY. Performed at [facility], [place] [address], [place], [place] [address]     Dg Chest 2 View  Result Date: 10/31/2017 CLINICAL DATA:  Fall 2 days ago with persistent chest pain, initial encounter EXAM: CHEST - 2 VIEW COMPARISON:  10/14/2017 FINDINGS: Cardiac shadow is within normal limits. The lungs are well aerated bilaterally. Minimal right basilar atelectasis is seen. No pneumothorax is. Degenerative changes of the thoracic spine are seen. No definitive rib fractures are noted. IMPRESSION: Mild right basilar atelectasis. No pneumothorax or rib abnormality is seen. Electronically Signed   By: Inez Catalina M.D.   On: 10/31/2017 16:12   Dg Pelvis 1-2 Views  Result Date: 10/31/2017 CLINICAL DATA:  Recent fall 2 days ago with pelvic pain, initial encounter EXAM: PELVIS - 1 VIEW COMPARISON:  None. FINDINGS: Pelvic ring is intact. No acute fracture or dislocation is seen. No gross soft tissue abnormality is noted.  IMPRESSION: No acute abnormality noted. Electronically Signed   By: Linus Mako.D.  On: 10/31/2017 16:14   Ct Head Wo Contrast  Result Date: 10/31/2017 CLINICAL DATA:  67 y/o M; fall to floor unable to move since Saturday. EXAM: CT HEAD WITHOUT CONTRAST CT CERVICAL SPINE WITHOUT CONTRAST TECHNIQUE: Multidetector CT imaging of the head and cervical spine was performed following the standard protocol without intravenous contrast. Multiplanar CT image reconstructions of the cervical spine were also generated. COMPARISON:  09/17/2017 CT head and cervical spine. FINDINGS: CT HEAD FINDINGS Brain: No evidence of acute infarction, hemorrhage, hydrocephalus, extra-axial collection or mass lesion/mass effect. Stable moderate chronic microvascular ischemic changes and volume loss of the brain. Stable chronic lacunar infarct within left paramedian pons. Vascular: Calcific atherosclerosis of carotid siphons. No hyperdense vessel. Skull: Normal. Negative for fracture or focal lesion. Sinuses/Orbits: Normal aeration of the visible paranasal sinuses and mastoid air cells. Left intra-ocular lens replacement. Other: Right-sided ossicular replacement prosthesis. CT CERVICAL SPINE FINDINGS Alignment: Normal. Skull base and vertebrae: No acute fracture. No primary bone lesion or focal pathologic process. Chronic T1 spinous process fracture. Soft tissues and spinal canal: No prevertebral fluid or swelling. No visible canal hematoma. Disc levels: Intervertebral disc space heights are maintained. Small endplate marginal osteophytes are present at the C5-6 level. Upper chest: Negative. Other: Mild calcific atherosclerosis of the carotid bifurcations. IMPRESSION: CT head: 1. No acute intracranial abnormality or calvarial fracture. 2. Stable chronic microvascular ischemic changes, volume loss of the brain, and small chronic left paramedian pontine infarct. CT cervical spine: No acute fracture or dislocation. Chronic T1 spinous process  fracture. Electronically Signed   By: Kristine Garbe M.D.   On: 10/31/2017 16:24   Ct Cervical Spine Wo Contrast  Result Date: 10/31/2017 CLINICAL DATA:  67 y/o M; fall to floor unable to move since Saturday. EXAM: CT HEAD WITHOUT CONTRAST CT CERVICAL SPINE WITHOUT CONTRAST TECHNIQUE: Multidetector CT imaging of the head and cervical spine was performed following the standard protocol without intravenous contrast. Multiplanar CT image reconstructions of the cervical spine were also generated. COMPARISON:  09/17/2017 CT head and cervical spine. FINDINGS: CT HEAD FINDINGS Brain: No evidence of acute infarction, hemorrhage, hydrocephalus, extra-axial collection or mass lesion/mass effect. Stable moderate chronic microvascular ischemic changes and volume loss of the brain. Stable chronic lacunar infarct within left paramedian pons. Vascular: Calcific atherosclerosis of carotid siphons. No hyperdense vessel. Skull: Normal. Negative for fracture or focal lesion. Sinuses/Orbits: Normal aeration of the visible paranasal sinuses and mastoid air cells. Left intra-ocular lens replacement. Other: Right-sided ossicular replacement prosthesis. CT CERVICAL SPINE FINDINGS Alignment: Normal. Skull base and vertebrae: No acute fracture. No primary bone lesion or focal pathologic process. Chronic T1 spinous process fracture. Soft tissues and spinal canal: No prevertebral fluid or swelling. No visible canal hematoma. Disc levels: Intervertebral disc space heights are maintained. Small endplate marginal osteophytes are present at the C5-6 level. Upper chest: Negative. Other: Mild calcific atherosclerosis of the carotid bifurcations. IMPRESSION: CT head: 1. No acute intracranial abnormality or calvarial fracture. 2. Stable chronic microvascular ischemic changes, volume loss of the brain, and small chronic left paramedian pontine infarct. CT cervical spine: No acute fracture or dislocation. Chronic T1 spinous process  fracture. Electronically Signed   By: Kristine Garbe M.D.   On: 10/31/2017 16:24   Mr Brain Wo Contrast  Result Date: 11/01/2017 CLINICAL DATA:  [place] in the bathroom 3 days ago.  Possible stroke. EXAM: MRI HEAD WITHOUT CONTRAST TECHNIQUE: Multiplanar, multiecho pulse sequences of the brain and surrounding structures were obtained without intravenous contrast. COMPARISON:  CT studies  10/31/2017 FINDINGS: Brain: Diffusion imaging shows a 5 mm acute infarction affecting the cortical and subcortical brain at the deep insula/temporoparietal junction on the left. No swelling, hemorrhage or mass effect. No other acute infarction. There chronic small-vessel ischemic changes of the pons. There are a few old small vessel cerebellar infarctions. Cerebral hemispheres elsewhere show chronic small-vessel ischemic changes the white matter. No large vessel territory infarction. Mass lesion, hemorrhage, hydrocephalus or extra-axial collection. Vascular: Major vessels at the base of the brain show flow. Skull and upper cervical spine: Negative Sinuses/Orbits: Clear/normal Other: None IMPRESSION: 5 mm acute infarction affecting the cortical and subcortical brain at the deep insula/temporoparietal junction region on the left. No mass effect or hemorrhage. Atrophy and extensive chronic small-vessel ischemic changes elsewhere. Electronically Signed   By: Nelson Chimes M.D.   On: 11/01/2017 12:41   Dg Shoulder Left  Result Date: 10/31/2017 CLINICAL DATA:  Fall 2 days ago with left shoulder pain, initial encounter EXAM: LEFT SHOULDER - 2+ VIEW COMPARISON:  06/26/2014 FINDINGS: Degenerative changes of the acromioclavicular joint are noted. No acute fracture or dislocation is seen. No soft tissue abnormality is noted. The underlying bony thorax is within normal limits. IMPRESSION: Mild degenerative change without acute abnormality. Electronically Signed   By: Inez Catalina M.D.   On: 10/31/2017 16:13    Review of Systems   Constitutional: Positive for malaise/fatigue.  HENT: Negative.   Eyes: Negative.   Respiratory: Negative.   Cardiovascular: Positive for leg swelling.  Gastrointestinal: Negative.   Genitourinary: Negative.   Musculoskeletal: Positive for back pain and joint pain.  Skin: Negative.   Neurological: Positive for tremors, loss of consciousness and weakness.  Psychiatric/Behavioral: Negative.    Blood pressure 128/64, pulse 65, temperature 98.2 F (36.8 C), resp. rate 16, height '5\' 3"'  (1.6 m), weight 95.7 kg, SpO2 96 %. Physical Exam  Constitutional: He appears well-developed.  HENT:  Head: Normocephalic and atraumatic.  Eyes: Conjunctivae are normal.  Neck: Neck supple.  Cardiovascular: Normal rate.  Respiratory: Effort normal.  GI: Soft.  Musculoskeletal:  Evaluation of bilateral upper extremities.  Patient is a resting tremor on the right with no notable swelling about his arm or forearm or hand.  He is able to lift the right upper externally off the bed and move his elbow actively.  He can also move his wrist and fingers actively.  He has no pain about the shoulder elbow or wrist to palpation.  No notable swelling. Valuation left upper extremity notes no resting tremor.  He has no notable bruising or swelling.  He does have a small abrasion on the anterior aspect of his shoulder.  She is unable to lift the arm off the bed.  Only active motor he is able to do his extend and flex his fingers and does notably have a trigger finger of his middle finger with tenderness on the palmar aspect at his PIP joint.  He endorses sensation intact bilaterally about the hands and the radial, ulnar and median nerve distributions.  On palpation he has tenderness about the shoulder and arm.  There is no deformity about the shoulder.  No tenderness along the clavicle.  Tenderness about the elbow.  Bilateral lower extremities patient is able to raise the right leg off the bed.  He is able to flex and extend  the knee albeit weakly.  He is able to dorsiflex and plantarflex his ankle.  Able to wiggle his toes.  Foot is warm and well-perfused.  No areas  of tenderness palpation about the ankle leg knee or thigh.  No pain in the right hip with leg motion.  Left lower extremity he is able to weakly lift this up from the bed with less strength compared to the right side.  He has difficulty bending his knee.  He is able to dorsiflex and plantarflex the ankle weekly.  He endorses sensation to light touch about the dorsal and plantar surfaces of his feet bilaterally.  They are warm and well-perfused.  Neurological: He is alert. Coordination abnormal.  Skin: Skin is warm. He is diaphoretic.  Psychiatric: His behavior is normal.      Erle Crocker 11/02/2017, 3:16 PM

## 2017-11-02 NOTE — Evaluation (Signed)
SLP Cancellation Note  Patient Details Name: Patrick Holt MRN: [id number] DOB: Mar 06, 1950   Cancelled treatment:       Reason Eval/Treat Not Completed: Fatigue/lethargy limiting ability to participate   Macario Golds 11/02/2017, 12:43 PM Luanna Salk, [facility] SLP Acute Rehab Services Pager [phone] Office [phone]

## 2017-11-02 NOTE — Progress Notes (Signed)
Pharmacy Brief Note:  Pharmacy consulted to convert anti-epileptic medications from IV to PO. The following orders were changed:  1) Keppra 500 mg IV q12h --> Keppra 500 mg PO BID 2) Valproate 250 mg IV q8h --> depakote DR 250 mg PO TID  These are patient's home doses according to the med rec.   Theodis Shove, PharmD, BCPS Clinical Pharmacist 11/02/17 3:50 PM

## 2017-11-02 NOTE — Progress Notes (Signed)
ANTICOAGULATION CONSULT NOTE - Follow Up Consult  Pharmacy Consult for Heparin Indication: atrial fibrillation  Allergies  Allergen Reactions  . Immune Globulins Other (See Comments)    Acute renal failure  . Rho (D) Immune Globulin     Acute renal failure Acute renal failure  . Ciprofloxacin Swelling  . Flagyl [Metronidazole] Swelling  . Lisinopril Diarrhea  . Sulfa Antibiotics Other (See Comments)    blisters  . Sulfasalazine     blisters blisters  . Cefepime Other (See Comments)    Diffuse maculopapular rash when receiving both vanc and cefepime, timing unclear.  . Morphine And Related Other (See Comments)    Ineffective for pain management  . Requip [Ropinirole Hcl] Other (See Comments)    constipation  . Vancomycin     Diffuse maculopapular rash when receiving both vanc/cefepime.  Timing unclear.    . Betamethasone Dipropionate Other (See Comments)    Unknown  . Betamethasone Dipropionate     Unknown  . Bupropion     Unknown  . Bupropion Hcl Other (See Comments)    Unknown  . Clobetasol Other (See Comments)    Unknown Unknown  . Codeine Other (See Comments)    Does not help with pain  . Escitalopram Oxalate Other (See Comments)    Unknown  . Escitalopram Oxalate     Unknown  . Fluoxetine Hcl Other (See Comments)    Unknown  . Fluoxetine Hcl     Unknown  . Furosemide Other (See Comments)    Unknown Unknown  . Meclizine Rash  . Paroxetine Other (See Comments)    Unknown  . Penicillins Other (See Comments)    Unknown reaction Has patient had a PCN reaction causing immediate rash, facial/tongue/throat swelling, SOB or lightheadedness with hypotension: unknown Has patient had a PCN reaction causing severe rash involving mucus membranes or skin necrosis: unknown Has patient had a PCN reaction that required hospitalization unknown Has patient had a PCN reaction occurring within the last 10 years: unknown If all of the above answers are "NO", then may proceed  with Cephalosporin use. Unknown Unknown reactio  . Tacrolimus Other (See Comments)    Unknown  . Tetanus Toxoid Other (See Comments)    Unknown  . Tuberculin Purified Protein Derivative Other (See Comments)    Unknown    Patient Measurements: Height: 5\' 3"  (160 cm) IBW/kg (Calculated) : 56.9 Heparin Dosing Weight:   Vital Signs: Temp: 99.5 F (37.5 C) (09/17 2055) Temp Source: Oral (09/17 2055) BP: 106/62 (09/18 0200) Pulse Rate: 67 (09/17 2055)  Labs: Recent Labs    10/31/17 1459 11/01/17 0837 11/01/17 2214  HGB 14.3 12.7*  --   HCT 44.5 39.6  --   PLT 387 314  --   APTT  --   --  74*  HEPARINUNFRC  --   --  1.24*  CREATININE 0.90 0.91  --   CKTOTAL 447*  --   --     CrCl cannot be calculated (Unknown ideal weight.).   Medications:  Infusions:  . sodium chloride Stopped (11/01/17 1419)  . heparin 1,200 Units/hr (11/01/17 1900)  . levETIRAcetam 500 mg (11/01/17 2358)  . valproate sodium 250 mg (11/01/17 2158)    Assessment: Patient with PTT at goal but heparin level elevated.  PTT ordered with Heparin level until both correlate due to possible drug-lab interaction between oral anticoagulant (rivaroxaban, edoxaban, or apixaban) and anti-Xa level (aka heparin level)  No heparin issues noted.  Goal of Therapy:  Heparin level 0.3-0.5 units/ml aPTT 66-85 seconds Monitor platelets by anticoagulation protocol: Yes   Plan:  Continue heparin drip at current rate Recheck level at 0600   Tyler Deis, [place] 11/02/2017,2:21 AM

## 2017-11-02 NOTE — Progress Notes (Signed)
  Echocardiogram 2D Echocardiogram with definity has been performed.  Darlina Sicilian M 11/02/2017, 8:42 AM

## 2017-11-02 NOTE — Progress Notes (Signed)
ANTICOAGULATION CONSULT NOTE - [facility] for IV heparin drip and conversion to apixaban Indication: atrial fibrillation, recent saddle PE, incidental finding of stroke   Allergies  Allergen Reactions  . Immune Globulins Other (See Comments)    Acute renal failure  . Rho (D) Immune Globulin     Acute renal failure Acute renal failure  . Ciprofloxacin Swelling  . Flagyl [Metronidazole] Swelling  . Lisinopril Diarrhea  . Sulfa Antibiotics Other (See Comments)    blisters  . Sulfasalazine     blisters blisters  . Cefepime Other (See Comments)    Diffuse maculopapular rash when receiving both vanc and cefepime, timing unclear.  . Morphine And Related Other (See Comments)    Ineffective for pain management  . Requip [Ropinirole Hcl] Other (See Comments)    constipation  . Vancomycin     Diffuse maculopapular rash when receiving both vanc/cefepime.  Timing unclear.    . Betamethasone Dipropionate Other (See Comments)    Unknown  . Betamethasone Dipropionate     Unknown  . Bupropion     Unknown  . Bupropion Hcl Other (See Comments)    Unknown  . Clobetasol Other (See Comments)    Unknown Unknown  . Codeine Other (See Comments)    Does not help with pain  . Escitalopram Oxalate Other (See Comments)    Unknown  . Escitalopram Oxalate     Unknown  . Fluoxetine Hcl Other (See Comments)    Unknown  . Fluoxetine Hcl     Unknown  . Furosemide Other (See Comments)    Unknown Unknown  . Meclizine Rash  . Paroxetine Other (See Comments)    Unknown  . Penicillins Other (See Comments)    Unknown reaction Has patient had a PCN reaction causing immediate rash, facial/tongue/throat swelling, SOB or lightheadedness with hypotension: unknown Has patient had a PCN reaction causing severe rash involving mucus membranes or skin necrosis: unknown Has patient had a PCN reaction that required hospitalization unknown Has patient had a PCN reaction occurring  within the last 10 years: unknown If all of the above answers are "NO", then may proceed with Cephalosporin use. Unknown Unknown reactio  . Tacrolimus Other (See Comments)    Unknown  . Tetanus Toxoid Other (See Comments)    Unknown  . Tuberculin Purified Protein Derivative Other (See Comments)    Unknown    Patient Measurements: Height: 5\' 3"  (160 cm) Weight: 211 lb (95.7 kg) IBW/kg (Calculated) : 56.9 Heparin Dosing Weight: ~78 kg  Vital Signs: Temp: 98.2 F (36.8 C) (09/18 1423) Temp Source: Oral (09/18 0617) BP: 128/64 (09/18 1423) Pulse Rate: 65 (09/18 1423)  Labs: Recent Labs    10/31/17 1459 11/01/17 0837 11/01/17 2214 11/02/17 0549 11/02/17 1432  HGB 14.3 12.7*  --  11.7*  --   HCT 44.5 39.6  --  37.5*  --   PLT 387 314  --  304  --   APTT  --   --  74* 102* 80*  HEPARINUNFRC  --   --  1.24* 0.97*  --   CREATININE 0.90 0.91  --  0.82  --   CKTOTAL 447*  --   --   --   --     Estimated Creatinine Clearance: 88.3 mL/min (by C-G formula based on SCr of 0.82 mg/dL).   Medical History: Past Medical History:  Diagnosis Date  . Anemia   . Anxiety   . Asthma   . Atrial fibrillation (  [place]) 01/2012   with RVR  . Bipolar 1 disorder ([place])   . Cancer ([place])   . Collagen vascular disease ([place])   . Conversion disorder 06/2010  . COPD (chronic obstructive pulmonary disease) ([place])   . Dermatomyositis ([place])   . Dermatomyositis ([place])   . Diverticulitis   . Diverticulosis of colon   . Dysrhythmia    "irregular" (11/15/2012)  . Esophageal dysmotility   . Esophageal stricture   . Fibromyalgia   . Gastritis   . GERD (gastroesophageal reflux disease)   . Hand fracture, right    sept 13, 2017, still wearing splint as og 01-10-2017  . Headache(784.0)    "severe; get them often" (11/15/2012)  . Hiatal hernia   . History of narcotic addiction ([place])   . Hx of adenomatous colonic polyps   . Hyperlipidemia   . Hypertension   . Internal hemorrhoids   . Ischemic heart  disease   . Major depression    with acute psychotic break in 06/2010  . Myocardial infarct ([place])    mulitple (1999, 2000, 2004)  . Narcotic dependence ([place])   . Nephrolithiasis   . Obesity   . OCD (obsessive compulsive disorder)   . Otosclerosis   . Paroxysmal A-fib ([place])   . Peripheral neuropathy   . Raynaud's disease   . Renal insufficiency   . Rheumatoid arthritis(714.0)   . Sarcoidosis   . Seizures ([place])   . Subarachnoid hemorrhage ([place]) 01/2012   with subdural  hematoma.   . Type II diabetes mellitus ([place])   . Urge incontinence   . Vertigo     Assessment: Pharmacy is consulted to dose IV heparin in 67 y/o male with PMH of a-fib and recent saddle PE on 10/11/17. Pt on apixaban PTA, last dose documented PTA as 10/29/17 at 0900 (admitted on 10/31/17). Apixaban resumed on admission, given a dose on 10/31/17 at 2346. Patient less responsive AM of 11/01/17 and unable to take PO meds, so apixaban transitioned to IV heparin. MRI done, which showed incidental finding of small infarct affecting the cortical and subcortical brain at the deep insula/temporoparietal junction region on the left. Neurology is following and has assessed patient.   Today, 11/02/17   Heparin level this morning = 0.97 units/mL, likely seeing effects of recent apixaban on heparin level  CBC: Hgb decreased to 11.7, Pltc WNL  APTT = 80 seconds is therapeutic on heparin infusion at 1100 units/hr  No infusion or bleeding issues noted per nursing  Pharmacy consulted to convert heparin drip back to apixaban  Goal of Therapy:  Heparin level 0.3-0.5 units/ml  APTT 66-84 seconds  (targeting lower end of goal ranges due to acute CVA) Monitor platelets by anticoagulation protocol: Yes   Plan:   Will continue heparin at current infusion rate of 1100 units/hr and convert patient back to apixaban this evening to avoid a gap in anticoagulation  Discontinue heparin drip and start apixaban 5 mg PO BID (PTA dose) at 2000  this evening  CBC tomorrow morning  Continue to monitor for s/sx of bleeding  Lenis Noon, PharmD Clinical Pharmacist 11/02/17 3:44 PM

## 2017-11-02 NOTE — Progress Notes (Signed)
Preliminary notes--Bilateral carotid duplex exam completed. Bilateral ICAs 1-39% stenosis. Bilateral vertebral arteries patent with antegrade flow. Hongying Landry Mellow (RDMS RVT) 11/02/17 3:45 PM

## 2017-11-02 NOTE — Progress Notes (Signed)
PROGRESS NOTE    Patrick Holt   ACZ:[id number]  DOB: 1950/02/23  DOA: 10/31/2017 PCP: Medicine, [facility]   Brief Narrative:  Patrick Holt with atrial fibrillation, diastolic dysfunction CHF, COPD, conversion disorder, asthma, right heart failure, chronic lower extremity stasis dermatitis who presented to the ER with generalized weakness, fall, and inability to ambulate. He was just discharged from rehab facility a couple of days before. He laid on the floor for 2 days.    Subjective: Very alert and oriented today- Has pain in his left upper arm and is having difficulty moving it.     Assessment & Plan:   Principal Problem:   Difficulty in walking/ falls - PT eval - will likely need SNF  Active Problems: CVA - likely incidental finding and not related to left arm weakness - has h/o A-fib- cont Eliquis which he did miss for 2 days while he was on the floor - MRI-  5 mm acute infarction affecting the cortical and subcortical brain at the deep insula/temporoparietal junction region on the left. No mass effect or hemorrhage. Atrophy and extensive chronic small-vessel ischemic changes elsewhere. - Neuro assisting with management - f/u Carotid duplex  Acute encephalopathy   Seizure disorder   - Note on 9/17> "difficult to arouse and eyes rolled back and fluttering" - ? whether he had a seizure- was loaded with Keppra 1.5 gm - STAT EEG unrevealing- see below - cont Keppra and Depakote- will change to oral today  Severe upper extremity tremors - needs to be fed   Low grade temp - 100.5 this AM - CXR on 9/16> right basilar atelectasis- does not admit to cough - UA> rare bacteria, 6-10 WBC, neg nitrites - no diarrhea and no leukocytosis - ? Due to atelectasis - follow fever curve   Depression - Risperdal    COPD (chronic obstructive pulmonary disease)  Nicotine abuse - stable- cont nebs and Prednisone - cont Nicotine patch    Atrial  fibrillation   - cont Amiodarone- d/c Heparin infusion and resume Eliquis    History of pulmonary embolism -cont Eliquis    Left-sided weakness  - suspect he laid on his arm while he was on the floor - he is quite tender - xray shoulder unrevealing- Ortho eval requested  - cont PRN Oxycodone  GERD - cont PPI   DVT prophylaxis: Heparin infusion Code Status: Full code Family Communication:  Disposition Plan: likely will need SNF Consultants:   Neuro Procedures:   EEG> Normal electroencephalogram, awake and drowsy. There are no focal lateralizing or epileptiform features.  2 D ECHO  - Left ventricle: The cavity size was normal. Wall thickness was   normal. Systolic function was normal. The estimated ejection   fraction was in the range of 55% to 60%. Wall motion was normal;   there were no regional wall motion abnormalities. - Aortic valve: There was mild stenosis. Valve area (VTI): 1.44   cm^2. Valve area (Vmax): 1.44 cm^2. Valve area (Vmean): 1.49   cm^2. - Right ventricle: The cavity size was mildly dilated. - Pulmonary arteries: Systolic pressure was moderately increased.   PA peak pressure: 51 mm Hg (S). Antimicrobials:  Anti-infectives (From admission, onward)   None       Objective: Vitals:   11/02/17 0520 11/02/17 0617 11/02/17 0730 11/02/17 0839  BP: (!) 101/48     Pulse: 76     Resp: 14     Temp: (!) 100.5 F (38.1 C)  98.7 F (37.1 C)    TempSrc: Oral Oral    SpO2: 95%   90%  Weight:   95.7 kg   Height:        Intake/Output Summary (Last 24 hours) at 11/02/2017 1414 Last data filed at 11/02/2017 1205 Gross per 24 hour  Intake 1177.93 ml  Output 500 ml  Net 677.93 ml   Filed Weights   11/02/17 0730  Weight: 95.7 kg    Examination: General exam: Appears comfortable  HEENT: PERRLA, oral mucosa moist, no sclera icterus or thrush Respiratory system: Clear to auscultation. Respiratory effort normal. Cardiovascular system: S1 & S2 heard, RRR.    Gastrointestinal system: Abdomen soft, non-tender, nondistended. Normal bowel sound. No organomegaly Central nervous system: Alert and oriented. Coarse upper extremity tremor- No focal neurological deficits. Extremities: No cyanosis, clubbing or edema- tenderness in left shoulder, upper arm and elbow Skin: No rashes or ulcers Psychiatry:  Mood & affect appropriate.   Data Reviewed: I have personally reviewed following labs and imaging studies  CBC: Recent Labs  Lab 10/31/17 1459 11/01/17 0837 11/02/17 0549  WBC 13.6* 8.7 7.5  NEUTROABS 11.2* 6.3 4.2  HGB 14.3 12.7* 11.7*  HCT 44.5 39.6 37.5*  MCV 91.6 91.9 93.3  PLT 387 314 478   Basic Metabolic Panel: Recent Labs  Lab 10/31/17 1459 11/01/17 0837 11/02/17 0549  NA 147* 145 143  K 3.5 3.3* 3.9  CL 105 109 109  CO2 28 27 26   GLUCOSE 93 101* 92  BUN 13 16 15   CREATININE 0.90 0.91 0.82  CALCIUM 9.5 8.9 8.7*  MG  --  1.9 1.9  PHOS  --  3.9 3.4   GFR: Estimated Creatinine Clearance: 88.3 mL/min (by C-G formula based on SCr of 0.82 mg/dL). Liver Function Tests: Recent Labs  Lab 10/31/17 1459 11/01/17 0837 11/02/17 0549  AST 21 22 20   ALT 17 17 17   ALKPHOS 68 55 50  BILITOT 1.6* 1.2 0.8  PROT 7.0 5.8* 5.3*  ALBUMIN 3.3* 2.6* 2.3*   No results for input(s): LIPASE, AMYLASE in the last 168 hours. Recent Labs  Lab 11/01/17 2214  AMMONIA 43*   Coagulation Profile: No results for input(s): INR, PROTIME in the last 168 hours. Cardiac Enzymes: Recent Labs  Lab 10/31/17 1459  CKTOTAL 447*   BNP (last 3 results) No results for input(s): PROBNP in the last 8760 hours. HbA1C: Recent Labs    11/01/17 1447  HGBA1C 5.4   CBG: No results for input(s): GLUCAP in the last 168 hours. Lipid Profile: Recent Labs    11/01/17 1447  CHOL 97  HDL 38*  LDLCALC 47  TRIG 59  CHOLHDL 2.6   Thyroid Function Tests: No results for input(s): TSH, T4TOTAL, FREET4, T3FREE, THYROIDAB in the last 72 hours. Anemia  Panel: No results for input(s): VITAMINB12, FOLATE, FERRITIN, TIBC, IRON, RETICCTPCT in the last 72 hours. Urine analysis:    Component Value Date/Time   COLORURINE YELLOW 10/31/2017 Patrick Holt 10/31/2017 1524   LABSPEC 1.023 10/31/2017 1524   PHURINE 5.0 10/31/2017 1524   GLUCOSEU NEGATIVE 10/31/2017 1524   HGBUR SMALL (A) 10/31/2017 1524   HGBUR negative 12/11/2009 1356   BILIRUBINUR NEGATIVE 10/31/2017 1524   BILIRUBINUR MODERATE 06/05/2015 1407   KETONESUR 80 (A) 10/31/2017 1524   PROTEINUR 100 (A) 10/31/2017 1524   UROBILINOGEN 2.0 06/05/2015 1407   UROBILINOGEN 1.0 12/30/2014 1830   NITRITE NEGATIVE 10/31/2017 1524   LEUKOCYTESUR NEGATIVE 10/31/2017 1524   Sepsis  Labs: @LABRCNTIP (procalcitonin:4,lacticidven:4) )No results found for this or any previous visit (from the past 240 hour(s)).       Radiology Studies: Dg Chest 2 View  Result Date: 10/31/2017 CLINICAL DATA:  Fall 2 days ago with persistent chest pain, initial encounter EXAM: CHEST - 2 VIEW COMPARISON:  10/14/2017 FINDINGS: Cardiac shadow is within normal limits. The lungs are well aerated bilaterally. Minimal right basilar atelectasis is seen. No pneumothorax is. Degenerative changes of the thoracic spine are seen. No definitive rib fractures are noted. IMPRESSION: Mild right basilar atelectasis. No pneumothorax or rib abnormality is seen. Electronically Signed   By: Inez Catalina M.D.   On: 10/31/2017 16:12   Dg Pelvis 1-2 Views  Result Date: 10/31/2017 CLINICAL DATA:  Recent fall 2 days ago with pelvic pain, initial encounter EXAM: PELVIS - 1 VIEW COMPARISON:  None. FINDINGS: Pelvic ring is intact. No acute fracture or dislocation is seen. No gross soft tissue abnormality is noted. IMPRESSION: No acute abnormality noted. Electronically Signed   By: Inez Catalina M.D.   On: 10/31/2017 16:14   Ct Head Wo Contrast  Result Date: 10/31/2017 CLINICAL DATA:  67 y/o M; fall to floor unable to move since  Saturday. EXAM: CT HEAD WITHOUT CONTRAST CT CERVICAL SPINE WITHOUT CONTRAST TECHNIQUE: Multidetector CT imaging of the head and cervical spine was performed following the standard protocol without intravenous contrast. Multiplanar CT image reconstructions of the cervical spine were also generated. COMPARISON:  09/17/2017 CT head and cervical spine. FINDINGS: CT HEAD FINDINGS Brain: No evidence of acute infarction, hemorrhage, hydrocephalus, extra-axial collection or mass lesion/mass effect. Stable moderate chronic microvascular ischemic changes and volume loss of the brain. Stable chronic lacunar infarct within left paramedian pons. Vascular: Calcific atherosclerosis of carotid siphons. No hyperdense vessel. Skull: Normal. Negative for fracture or focal lesion. Sinuses/Orbits: Normal aeration of the visible paranasal sinuses and mastoid air cells. Left intra-ocular lens replacement. Other: Right-sided ossicular replacement prosthesis. CT CERVICAL SPINE FINDINGS Alignment: Normal. Skull base and vertebrae: No acute fracture. No primary bone lesion or focal pathologic process. Chronic T1 spinous process fracture. Soft tissues and spinal canal: No prevertebral fluid or swelling. No visible canal hematoma. Disc levels: Intervertebral disc space heights are maintained. Small endplate marginal osteophytes are present at the C5-6 level. Upper chest: Negative. Other: Mild calcific atherosclerosis of the carotid bifurcations. IMPRESSION: CT head: 1. No acute intracranial abnormality or calvarial fracture. 2. Stable chronic microvascular ischemic changes, volume loss of the brain, and small chronic left paramedian pontine infarct. CT cervical spine: No acute fracture or dislocation. Chronic T1 spinous process fracture. Electronically Signed   By: Kristine Garbe M.D.   On: 10/31/2017 16:24   Ct Cervical Spine Wo Contrast  Result Date: 10/31/2017 CLINICAL DATA:  67 y/o M; fall to floor unable to move since  Saturday. EXAM: CT HEAD WITHOUT CONTRAST CT CERVICAL SPINE WITHOUT CONTRAST TECHNIQUE: Multidetector CT imaging of the head and cervical spine was performed following the standard protocol without intravenous contrast. Multiplanar CT image reconstructions of the cervical spine were also generated. COMPARISON:  09/17/2017 CT head and cervical spine. FINDINGS: CT HEAD FINDINGS Brain: No evidence of acute infarction, hemorrhage, hydrocephalus, extra-axial collection or mass lesion/mass effect. Stable moderate chronic microvascular ischemic changes and volume loss of the brain. Stable chronic lacunar infarct within left paramedian pons. Vascular: Calcific atherosclerosis of carotid siphons. No hyperdense vessel. Skull: Normal. Negative for fracture or focal lesion. Sinuses/Orbits: Normal aeration of the visible paranasal sinuses and mastoid air cells. Left  intra-ocular lens replacement. Other: Right-sided ossicular replacement prosthesis. CT CERVICAL SPINE FINDINGS Alignment: Normal. Skull base and vertebrae: No acute fracture. No primary bone lesion or focal pathologic process. Chronic T1 spinous process fracture. Soft tissues and spinal canal: No prevertebral fluid or swelling. No visible canal hematoma. Disc levels: Intervertebral disc space heights are maintained. Small endplate marginal osteophytes are present at the C5-6 level. Upper chest: Negative. Other: Mild calcific atherosclerosis of the carotid bifurcations. IMPRESSION: CT head: 1. No acute intracranial abnormality or calvarial fracture. 2. Stable chronic microvascular ischemic changes, volume loss of the brain, and small chronic left paramedian pontine infarct. CT cervical spine: No acute fracture or dislocation. Chronic T1 spinous process fracture. Electronically Signed   By: Kristine Garbe M.D.   On: 10/31/2017 16:24   Mr Brain Wo Contrast  Result Date: 11/01/2017 CLINICAL DATA:  [place] in the bathroom 3 days ago.  Possible stroke. EXAM: MRI  HEAD WITHOUT CONTRAST TECHNIQUE: Multiplanar, multiecho pulse sequences of the brain and surrounding structures were obtained without intravenous contrast. COMPARISON:  CT studies 10/31/2017 FINDINGS: Brain: Diffusion imaging shows a 5 mm acute infarction affecting the cortical and subcortical brain at the deep insula/temporoparietal junction on the left. No swelling, hemorrhage or mass effect. No other acute infarction. There chronic small-vessel ischemic changes of the pons. There are a few old small vessel cerebellar infarctions. Cerebral hemispheres elsewhere show chronic small-vessel ischemic changes the white matter. No large vessel territory infarction. Mass lesion, hemorrhage, hydrocephalus or extra-axial collection. Vascular: Major vessels at the base of the brain show flow. Skull and upper cervical spine: Negative Sinuses/Orbits: Clear/normal Other: None IMPRESSION: 5 mm acute infarction affecting the cortical and subcortical brain at the deep insula/temporoparietal junction region on the left. No mass effect or hemorrhage. Atrophy and extensive chronic small-vessel ischemic changes elsewhere. Electronically Signed   By: Nelson Chimes M.D.   On: 11/01/2017 12:41   Dg Shoulder Left  Result Date: 10/31/2017 CLINICAL DATA:  Fall 2 days ago with left shoulder pain, initial encounter EXAM: LEFT SHOULDER - 2+ VIEW COMPARISON:  06/26/2014 FINDINGS: Degenerative changes of the acromioclavicular joint are noted. No acute fracture or dislocation is seen. No soft tissue abnormality is noted. The underlying bony thorax is within normal limits. IMPRESSION: Mild degenerative change without acute abnormality. Electronically Signed   By: Inez Catalina M.D.   On: 10/31/2017 16:13      Scheduled Meds: . ALPRAZolam  0.5 mg Oral TID  . amiodarone  200 mg Oral Daily  . atorvastatin  40 mg Oral Daily  . chlorhexidine  15 mL Mouth Rinse BID  . guaiFENesin  1,200 mg Oral BID  . mouth rinse  15 mL Mouth Rinse q12n4p    . mometasone-formoterol  2 puff Inhalation BID  . multivitamin with minerals  1 tablet Oral Daily  . nicotine  21 mg Transdermal Daily  . oxybutynin  5 mg Oral TID  . pantoprazole  80 mg Oral Q1200  . potassium chloride SA  20 mEq Oral QODAY  . predniSONE  5 mg Oral Q breakfast  . risperiDONE  1 mg Oral BID   Continuous Infusions: . sodium chloride Stopped (11/01/17 1419)  . heparin 1,100 Units/hr (11/02/17 0829)  . levETIRAcetam 500 mg (11/02/17 1124)  . valproate sodium 250 mg (11/02/17 1214)     LOS: 0 days    Time spent in minutes: 35    Debbe Odea, MD [facility] Pager: [email or web address] Password [facility] 11/02/2017, 2:14 PM

## 2017-11-02 NOTE — Progress Notes (Signed)
PT Cancellation Note  Patient Details Name: Patrick Holt MRN: [id number] DOB: 03-14-1950   Cancelled Treatment:    Reason Eval/Treat Not Completed: Fatigue/lethargy limiting ability to participate;Patient declined, no reason specified - Pt with eyes closed upon arrival to room. PT asked pt to mobilize with assist, stating "I can't move". Pt only able to partially open eyes for duration of conversation and closed eyes/appeared to fall asleep after 10 second conversation. Pt too drowsy and defers PT at this time. Will continue to follow acutely.   Julien Girt, PT Acute Rehabilitation Services Pager [phone]  Office [phone]    Patrick Holt 11/02/2017, 12:37 PM

## 2017-11-02 NOTE — Progress Notes (Signed)
Incidental left parietal infarct  Bilateral carotid duplex exam completed. Bilateral ICAs 1-39% stenosis. Bilateral vertebral arteries patent with antegrade flow. Echo:  Systolic function was normal. No tjhrombus.  Seizures Continue Keppra and valproic acid    Plan Continue Eliquis  Can follow up Neurology as outpatient  Neurology will sign off.  No charge.

## 2017-11-02 NOTE — Progress Notes (Signed)
ANTICOAGULATION CONSULT NOTE - [place] for IV heparin Indication: atrial fibrillation, recent saddle PE, incidental finding of stroke   Allergies  Allergen Reactions  . Immune Globulins Other (See Comments)    Acute renal failure  . Rho (D) Immune Globulin     Acute renal failure Acute renal failure  . Ciprofloxacin Swelling  . Flagyl [Metronidazole] Swelling  . Lisinopril Diarrhea  . Sulfa Antibiotics Other (See Comments)    blisters  . Sulfasalazine     blisters blisters  . Cefepime Other (See Comments)    Diffuse maculopapular rash when receiving both vanc and cefepime, timing unclear.  . Morphine And Related Other (See Comments)    Ineffective for pain management  . Requip [Ropinirole Hcl] Other (See Comments)    constipation  . Vancomycin     Diffuse maculopapular rash when receiving both vanc/cefepime.  Timing unclear.    . Betamethasone Dipropionate Other (See Comments)    Unknown  . Betamethasone Dipropionate     Unknown  . Bupropion     Unknown  . Bupropion Hcl Other (See Comments)    Unknown  . Clobetasol Other (See Comments)    Unknown Unknown  . Codeine Other (See Comments)    Does not help with pain  . Escitalopram Oxalate Other (See Comments)    Unknown  . Escitalopram Oxalate     Unknown  . Fluoxetine Hcl Other (See Comments)    Unknown  . Fluoxetine Hcl     Unknown  . Furosemide Other (See Comments)    Unknown Unknown  . Meclizine Rash  . Paroxetine Other (See Comments)    Unknown  . Penicillins Other (See Comments)    Unknown reaction Has patient had a PCN reaction causing immediate rash, facial/tongue/throat swelling, SOB or lightheadedness with hypotension: unknown Has patient had a PCN reaction causing severe rash involving mucus membranes or skin necrosis: unknown Has patient had a PCN reaction that required hospitalization unknown Has patient had a PCN reaction occurring within the last 10 years: unknown If  all of the above answers are "NO", then may proceed with Cephalosporin use. Unknown Unknown reactio  . Tacrolimus Other (See Comments)    Unknown  . Tetanus Toxoid Other (See Comments)    Unknown  . Tuberculin Purified Protein Derivative Other (See Comments)    Unknown    Patient Measurements: Height: 5\' 3"  (160 cm) Weight: 211 lb (95.7 kg) IBW/kg (Calculated) : 56.9 Heparin Dosing Weight: 78.5 kg  Vital Signs: Temp: 98.7 F (37.1 C) (09/18 0617) Temp Source: Oral (09/18 0617) BP: 101/48 (09/18 0520) Pulse Rate: 76 (09/18 0520)  Labs: Recent Labs    10/31/17 1459 11/01/17 0837 11/01/17 2214 11/02/17 0549  HGB 14.3 12.7*  --  11.7*  HCT 44.5 39.6  --  37.5*  PLT 387 314  --  304  APTT  --   --  74* 102*  HEPARINUNFRC  --   --  1.24* 0.97*  CREATININE 0.90 0.91  --  0.82  CKTOTAL 447*  --   --   --     Estimated Creatinine Clearance: 88.3 mL/min (by C-G formula based on SCr of 0.82 mg/dL).   Medical History: Past Medical History:  Diagnosis Date  . Anemia   . Anxiety   . Asthma   . Atrial fibrillation ([place]) 01/2012   with RVR  . Bipolar 1 disorder ([place])   . Cancer ([place])   . Collagen vascular disease ([place])   .  Conversion disorder 06/2010  . COPD (chronic obstructive pulmonary disease) ([place])   . Dermatomyositis ([place])   . Dermatomyositis ([place])   . Diverticulitis   . Diverticulosis of colon   . Dysrhythmia    "irregular" (11/15/2012)  . Esophageal dysmotility   . Esophageal stricture   . Fibromyalgia   . Gastritis   . GERD (gastroesophageal reflux disease)   . Hand fracture, right    sept 13, 2017, still wearing splint as og 01-10-2017  . Headache(784.0)    "severe; get them often" (11/15/2012)  . Hiatal hernia   . History of narcotic addiction ([place])   . Hx of adenomatous colonic polyps   . Hyperlipidemia   . Hypertension   . Internal hemorrhoids   . Ischemic heart disease   . Major depression    with acute psychotic break in 06/2010  . Myocardial  infarct ([place])    mulitple (1999, 2000, 2004)  . Narcotic dependence ([place])   . Nephrolithiasis   . Obesity   . OCD (obsessive compulsive disorder)   . Otosclerosis   . Paroxysmal A-fib ([place])   . Peripheral neuropathy   . Raynaud's disease   . Renal insufficiency   . Rheumatoid arthritis(714.0)   . Sarcoidosis   . Seizures ([place])   . Subarachnoid hemorrhage ([place]) 01/2012   with subdural  hematoma.   . Type II diabetes mellitus ([place])   . Urge incontinence   . Vertigo     Assessment: Pharmacy is consulted to dose IV heparin in 67 y/o male with PMH of a-fib and recent saddle PE on 10/11/17. Pt on apixaban PTA, last dose documented PTA as 10/29/17 at 0900 (admitted on 10/31/17). Apixaban resumed on admission, given a dose on 10/31/17 at 2346. Patient less responsive AM of 11/01/17 and unable to take PO meds, so apixaban transitioned to IV heparin. MRI done, which showed incidental finding of small infarct affecting the cortical and subcortical brain at the deep insula/temporoparietal junction region on the left.  Today, 11/02/17   Heparin level = 0.97 units/mL, likely seeing effects of recent apixaban on heparin level  APTT = 102 seconds, supratherapeutic on heparin infusion at 1200 units/hr  CBC: Hgb decreased to 11.7, Pltc WNL  No infusion or bleeding issues noted per nursing  Goal of Therapy:  Heparin level 0.3-0.5 units/ml  APTT 66-84 seconds  (targeting lower end of goal ranges due to acute CVA) Monitor platelets by anticoagulation protocol: Yes   Plan:   Decrease heparin infusion to 1100 units/hr  Due to recent apixaban use which can falsely elevate heparin levels, will monitor both aPTT and heparin until both correlate (using aPTT for rate adjustment for now)   aPTT 6 hours after rate change  Daily CBC and heparin level   Monitor for s/sx of bleeding  F/u plans to transition back to [place], PharmD, BCPS Pager: [phone] 11/02/2017 7:57 AM

## 2017-11-03 LAB — CBC
HEMATOCRIT: 36.7 % — AB (ref 39.0–52.0)
HEMOGLOBIN: 11.7 g/dL — AB (ref 13.0–17.0)
MCH: 29.2 pg (ref 26.0–34.0)
MCHC: 31.9 g/dL (ref 30.0–36.0)
MCV: 91.5 fL (ref 78.0–100.0)
Platelets: 269 10*3/uL (ref 150–400)
RBC: 4.01 MIL/uL — AB (ref 4.22–5.81)
RDW: 19.6 % — ABNORMAL HIGH (ref 11.5–15.5)
WBC: 7 10*3/uL (ref 4.0–10.5)

## 2017-11-03 MED ORDER — JUVEN PO PACK
1.0000 | PACK | Freq: Two times a day (BID) | ORAL | Status: DC
  Administered 2017-11-04 – 2017-11-10 (×7): 1 via ORAL
  Filled 2017-11-03 (×15): qty 1

## 2017-11-03 NOTE — Evaluation (Signed)
Physical Therapy Evaluation Patient Details Name: Patrick Holt MRN: [id number] DOB: 06/08/1950 Today's Date: 11/03/2017   History of Present Illness  Pt is a 67 y.o. M with significant PMH of multiple comorbidities including atrial fibrillation, diastolic dysfunction CHF, COPD, conversion disorder, asthma, right hear failure, chronic lower extremity stasis dermatitis. Previous hospitalization for PE, now admited s/p fall with prolonged down time.  Clinical Impression  Patient presents with decreased mobility due to pain, decreased strength, decreased balance, decreased activity tolerance, currently +2 total A for bed mobility and max A for sitting EOB.  He will need STSNF level rehab versus ALF with HHPT depending on progress and level of care.  PT to follow acutely.    Follow Up Recommendations SNF;Supervision/Assistance - 24 hour;Supervision for mobility/OOB;Other (comment)(versus ALF that can provide total A)    Equipment Recommendations  Other (comment)(TBA)    Recommendations for Other Services       Precautions / Restrictions Precautions Precautions: Fall Precaution Comments: multiple falls      Mobility  Bed Mobility Overal bed mobility: Needs Assistance Bed Mobility: Rolling;Sidelying to Sit Rolling: Max assist;+2 for physical assistance Sidelying to sit: Total assist;+2 for physical assistance   Sit to supine: Total assist;+2 for physical assistance   General bed mobility comments: used pad to assist with rolling and side to sit due to painful L shoulder (Rolled R)  Transfers                 General transfer comment: deferred due to pain, poor activity tolerance, decreased balance  Ambulation/Gait                Stairs            Wheelchair Mobility    Modified Rankin (Stroke Patients Only) Modified Rankin (Stroke Patients Only) Pre-Morbid Rankin Score: Moderately severe disability Modified Rankin: Severe disability     Balance  Overall balance assessment: Needs assistance Sitting-balance support: Feet supported Sitting balance-Leahy Scale: Zero Sitting balance - Comments: assist for balance EOB And pt sliding so assist to scoot back onto bed x 2. work in sitting on cervical ROM, trunk flexion, rotation and balance, though pt with limited tolerance  Sat EOB About 10 minutes Postural control: Posterior lean                                   Pertinent Vitals/Pain Faces Pain Scale: Hurts whole lot Pain Location: generalized (specifically L arm, neck, back, L hip with movement) Pain Descriptors / Indicators: Aching;Grimacing;Sore;Tightness;Moaning Pain Intervention(s): Monitored during session;Repositioned;Limited activity within patient's tolerance    Home Living Family/patient expects to be discharged to:: Unsure Living Arrangements: Alone Available Help at Discharge: Personal care attendant(8 hours per week)             Additional Comments: Senior apartment, but only home from rehab [facility]) a few hours prior to fall    Prior Function        ADL's / [employer] Assistance Needed: aide assists with bathing/dressing; sponge bathes  Comments: aide comes 3x/week and he reports that he pays out of pocket for an additional 2 days a weej     Hand Dominance   Dominant Hand: Right    Extremity/Trunk Assessment   Upper Extremity Assessment Upper Extremity Assessment: RUE deficits/detail;LUE deficits/detail RUE Deficits / Details: significant tremors  RUE Sensation: WNL LUE Deficits / Details: numbness, painful with any PROM, not attempting any  AROM, only lifted shoulder to about 30 with pain, edema throughout UE LUE: Unable to fully assess due to pain LUE Sensation: decreased light touch    Lower Extremity Assessment Lower Extremity Assessment: LLE deficits/detail LLE Deficits / Details: AAROM limited hip flexion and knee flexion due to pain, ankle AAROM Grossly WFL, strength knee  extension 3-/5 LLE Sensation: decreased light touch    Cervical / Trunk Assessment Cervical / Trunk Assessment: Kyphotic;Other exceptions Cervical / Trunk Exceptions: keeping head turned R  Communication   Communication: No difficulties  Cognition Arousal/Alertness: Awake/alert Behavior During Therapy: WFL for tasks assessed/performed Overall Cognitive Status: No family/caregiver present to determine baseline cognitive functioning Area of Impairment: Safety/judgement;Problem solving;Memory                     Memory: Decreased short-term memory   Safety/Judgement: Decreased awareness of deficits   Problem Solving: Slow processing;Decreased initiation;Difficulty sequencing General Comments: pt reports STM deficits at baseline      General Comments      Exercises     Assessment/Plan    PT Assessment Patient needs continued PT services  PT Problem List Decreased strength;Decreased mobility;Decreased safety awareness;Decreased range of motion;Decreased activity tolerance;Decreased balance;Pain;Decreased knowledge of precautions       PT Treatment Interventions DME instruction;Therapeutic activities;Therapeutic exercise;Patient/family education;Gait training;Balance training;Functional mobility training    PT Goals (Current goals can be found in the Care Plan section)  Acute Rehab PT Goals Patient Stated Goal: to go to ALF, then back to SNF after 60 days PT Goal Formulation: With patient Time For Goal Achievement: 11/17/17 Potential to Achieve Goals: Fair    Frequency Min 2X/week   Barriers to discharge        Co-evaluation               AM-PAC PT "6 Clicks" Daily Activity  Outcome Measure Difficulty turning over in bed (including adjusting bedclothes, sheets and blankets)?: Unable Difficulty moving from lying on back to sitting on the side of the bed? : Unable Difficulty sitting down on and standing up from a chair with arms (e.g., wheelchair,  bedside commode, etc,.)?: Unable Help needed moving to and from a bed to chair (including a wheelchair)?: Total Help needed walking in hospital room?: Total Help needed climbing 3-5 steps with a railing? : Total 6 Click Score: 6    End of Session   Activity Tolerance: Patient limited by fatigue;Patient limited by pain Patient left: in bed;with call bell/phone within reach   PT Visit Diagnosis: Other abnormalities of gait and mobility (R26.89);Muscle weakness (generalized) (M62.81)    Time: 3474-2595 PT Time Calculation (min) (ACUTE ONLY): 44 min   Charges:   PT Evaluation $PT Eval High Complexity: 1 High PT Treatments $Therapeutic Activity: 23-37 mins        Magda Kiel, [place] [facility] [phone] 11/03/2017   [name] 11/03/2017, 3:47 PM

## 2017-11-03 NOTE — Progress Notes (Signed)
PROGRESS NOTE    Patrick Holt   BWL:[id number]  DOB: [birth year]  DOA: 10/31/2017 PCP: Medicine, [facility]   Brief Narrative:  [name] with atrial fibrillation, diastolic dysfunction CHF, COPD, conversion disorder, asthma, right heart failure, chronic lower extremity stasis dermatitis who presented to the ER with generalized weakness, fall, and inability to ambulate. He was just discharged from rehab facility a couple of days before. He laid on the floor for 2 days.    Subjective: Other than pain in left arm, he has no other complaints.     Assessment & Plan:   Principal Problem:   Difficulty in walking/ falls - PT eval pending >> will likely need SNF  Active Problems: CVA - likely incidental finding and not related to left arm weakness - has h/o A-fib- cont Eliquis which he did miss for 2 days while he was on the floor - MRI-  5 mm acute infarction affecting the cortical and subcortical brain at the deep insula/temporoparietal junction region on the left. No mass effect or hemorrhage. Atrophy and extensive chronic small-vessel ischemic changes elsewhere. - Neuro assisting with management - Carotid duplex >> Bilateral ICAs 1-39% stenosis. Bilateral vertebral arteries patent with antegrade flow - 2 D ECHO> no thrombus- see report below  Acute encephalopathy   Seizure disorder   - Note on 9/17> "difficult to arouse and eyes rolled back and fluttering" - ? whether he had a seizure- was loaded with Keppra 1.5 gm - STAT EEG unrevealing- see below - cont Keppra and Depakote-    Severe upper extremity tremors - needs to be fed meals  Low grade temp - 100.5 on 9/18 AM- has not recurred - CXR on 9/16> right basilar atelectasis-   - UA> rare bacteria, 6-10 WBC, neg nitrites - no diarrhea and no leukocytosis - ? Due to atelectasis- start IS - follow fever curve   Depression - Risperdal    COPD (chronic obstructive pulmonary disease)  Nicotine  abuse - stable- cont nebs and Prednisone - cont Nicotine patch    Atrial fibrillation   - cont Amiodarone- d/c'd Heparin infusion and resumed Eliquis on 9/19    History of pulmonary embolism -cont Eliquis    Left-sided weakness  - suspect he laid on his arm while he was on the floor - he is quite tender - xray shoulder unrevealing- Ortho eval requested - Dr Lucia Gaskins recommending PT- axillary view xray of shoulder ordered and this is unrevealing-  - cont PRN Oxycodone  GERD - cont PPI   DVT prophylaxis: Heparin infusion Code Status: Full code Family Communication:  Disposition Plan: likely will need SNF Consultants:   Neuro Procedures:   EEG> Normal electroencephalogram, awake and drowsy. There are no focal lateralizing or epileptiform features.  2 D ECHO  - Left ventricle: The cavity size was normal. Wall thickness was   normal. Systolic function was normal. The estimated ejection   fraction was in the range of 55% to 60%. Wall motion was normal;   there were no regional wall motion abnormalities. - Aortic valve: There was mild stenosis. Valve area (VTI): 1.44   cm^2. Valve area (Vmax): 1.44 cm^2. Valve area (Vmean): 1.49   cm^2. - Right ventricle: The cavity size was mildly dilated. - Pulmonary arteries: Systolic pressure was moderately increased.   PA peak pressure: 51 mm Hg (S). Antimicrobials:  Anti-infectives (From admission, onward)   None       Objective: Vitals:   11/02/17 2145 11/03/17  0445 11/03/17 0801 11/03/17 0803  BP: 106/62 121/68    Pulse: 62 (!) 57    Resp: 14 19    Temp: 99.1 F (37.3 C) 98.3 F (36.8 C)    TempSrc:      SpO2: 99% 98% 94% 94%  Weight:      Height:        Intake/Output Summary (Last 24 hours) at 11/03/2017 1006 Last data filed at 11/03/2017 0653 Gross per 24 hour  Intake 1696.61 ml  Output 525 ml  Net 1171.61 ml   Filed Weights   11/02/17 0730  Weight: 95.7 kg    Examination: General exam: Appears comfortable    HEENT: PERRLA, oral mucosa moist, no sclera icterus or thrush Respiratory system: Clear to auscultation. Respiratory effort normal. Cardiovascular system: S1 & S2 heard, RRR.   Gastrointestinal system: Abdomen soft, non-tender, nondistended. Normal bowel sound. No organomegaly Central nervous system: Alert and oriented. Coarse upper extremity tremor- No focal neurological deficits. Extremities: No cyanosis, clubbing or edema- tenderness in left shoulder, upper arm and elbow Skin: No rashes or ulcers Psychiatry:  Mood & affect appropriate.   Data Reviewed: I have personally reviewed following labs and imaging studies  CBC: Recent Labs  Lab 10/31/17 1459 11/01/17 0837 11/02/17 0549 11/03/17 0536  WBC 13.6* 8.7 7.5 7.0  NEUTROABS 11.2* 6.3 4.2  --   HGB 14.3 12.7* 11.7* 11.7*  HCT 44.5 39.6 37.5* 36.7*  MCV 91.6 91.9 93.3 91.5  PLT 387 314 304 250   Basic Metabolic Panel: Recent Labs  Lab 10/31/17 1459 11/01/17 0837 11/02/17 0549  NA 147* 145 143  K 3.5 3.3* 3.9  CL 105 109 109  CO2 28 27 26   GLUCOSE 93 101* 92  BUN 13 16 15   CREATININE 0.90 0.91 0.82  CALCIUM 9.5 8.9 8.7*  MG  --  1.9 1.9  PHOS  --  3.9 3.4   GFR: Estimated Creatinine Clearance: 88.3 mL/min (by C-G formula based on SCr of 0.82 mg/dL). Liver Function Tests: Recent Labs  Lab 10/31/17 1459 11/01/17 0837 11/02/17 0549  AST 21 22 20   ALT 17 17 17   ALKPHOS 68 55 50  BILITOT 1.6* 1.2 0.8  PROT 7.0 5.8* 5.3*  ALBUMIN 3.3* 2.6* 2.3*   No results for input(s): LIPASE, AMYLASE in the last 168 hours. Recent Labs  Lab 11/01/17 2214  AMMONIA 43*   Coagulation Profile: No results for input(s): INR, PROTIME in the last 168 hours. Cardiac Enzymes: Recent Labs  Lab 10/31/17 1459  CKTOTAL 447*   BNP (last 3 results) No results for input(s): PROBNP in the last 8760 hours. HbA1C: Recent Labs    11/01/17 1447  HGBA1C 5.4   CBG: No results for input(s): GLUCAP in the last 168 hours. Lipid  Profile: Recent Labs    11/01/17 1447  CHOL 97  HDL 38*  LDLCALC 47  TRIG 59  CHOLHDL 2.6   Thyroid Function Tests: No results for input(s): TSH, T4TOTAL, FREET4, T3FREE, THYROIDAB in the last 72 hours. Anemia Panel: No results for input(s): VITAMINB12, FOLATE, FERRITIN, TIBC, IRON, RETICCTPCT in the last 72 hours. Urine analysis:    Component Value Date/Time   COLORURINE YELLOW 10/31/2017 [place] 10/31/2017 1524   LABSPEC 1.023 10/31/2017 1524   PHURINE 5.0 10/31/2017 1524   GLUCOSEU NEGATIVE 10/31/2017 1524   HGBUR SMALL (A) 10/31/2017 1524   HGBUR negative 12/11/2009 [place] 10/31/2017 [place] 06/05/2015 1407  KETONESUR 80 (A) 10/31/2017 1524   PROTEINUR 100 (A) 10/31/2017 1524   UROBILINOGEN 2.0 06/05/2015 1407   UROBILINOGEN 1.0 12/30/2014 1830   NITRITE NEGATIVE 10/31/2017 1524   LEUKOCYTESUR NEGATIVE 10/31/2017 1524   Sepsis Labs: @LABRCNTIP (procalcitonin:4,lacticidven:4) )No results found for this or any previous visit (from the past 240 hour(s)).       Radiology Studies: Dg Chest 2 View  Result Date: 10/31/2017 CLINICAL DATA:  Fall 2 days ago with persistent chest pain, initial encounter EXAM: CHEST - 2 VIEW COMPARISON:  10/14/2017 FINDINGS: Cardiac shadow is within normal limits. The lungs are well aerated bilaterally. Minimal right basilar atelectasis is seen. No pneumothorax is. Degenerative changes of the thoracic spine are seen. No definitive rib fractures are noted. IMPRESSION: Mild right basilar atelectasis. No pneumothorax or rib abnormality is seen. Electronically Signed   By: Inez Catalina M.D.   On: 10/31/2017 16:12   Dg Pelvis 1-2 Views  Result Date: 10/31/2017 CLINICAL DATA:  Recent fall 2 days ago with pelvic pain, initial encounter EXAM: PELVIS - 1 VIEW COMPARISON:  None. FINDINGS: Pelvic ring is intact. No acute fracture or dislocation is seen. No gross soft tissue abnormality is noted.  IMPRESSION: No acute abnormality noted. Electronically Signed   By: Inez Catalina M.D.   On: 10/31/2017 16:14   Ct Head Wo Contrast  Result Date: 10/31/2017 CLINICAL DATA:  67 y/o M; fall to floor unable to move since Saturday. EXAM: CT HEAD WITHOUT CONTRAST CT CERVICAL SPINE WITHOUT CONTRAST TECHNIQUE: Multidetector CT imaging of the head and cervical spine was performed following the standard protocol without intravenous contrast. Multiplanar CT image reconstructions of the cervical spine were also generated. COMPARISON:  09/17/2017 CT head and cervical spine. FINDINGS: CT HEAD FINDINGS Brain: No evidence of acute infarction, hemorrhage, hydrocephalus, extra-axial collection or mass lesion/mass effect. Stable moderate chronic microvascular ischemic changes and volume loss of the brain. Stable chronic lacunar infarct within left paramedian pons. Vascular: Calcific atherosclerosis of carotid siphons. No hyperdense vessel. Skull: Normal. Negative for fracture or focal lesion. Sinuses/Orbits: Normal aeration of the visible paranasal sinuses and mastoid air cells. Left intra-ocular lens replacement. Other: Right-sided ossicular replacement prosthesis. CT CERVICAL SPINE FINDINGS Alignment: Normal. Skull base and vertebrae: No acute fracture. No primary bone lesion or focal pathologic process. Chronic T1 spinous process fracture. Soft tissues and spinal canal: No prevertebral fluid or swelling. No visible canal hematoma. Disc levels: Intervertebral disc space heights are maintained. Small endplate marginal osteophytes are present at the C5-6 level. Upper chest: Negative. Other: Mild calcific atherosclerosis of the carotid bifurcations. IMPRESSION: CT head: 1. No acute intracranial abnormality or calvarial fracture. 2. Stable chronic microvascular ischemic changes, volume loss of the brain, and small chronic left paramedian pontine infarct. CT cervical spine: No acute fracture or dislocation. Chronic T1 spinous process  fracture. Electronically Signed   By: Kristine Garbe M.D.   On: 10/31/2017 16:24   Ct Cervical Spine Wo Contrast  Result Date: 10/31/2017 CLINICAL DATA:  67 y/o M; fall to floor unable to move since Saturday. EXAM: CT HEAD WITHOUT CONTRAST CT CERVICAL SPINE WITHOUT CONTRAST TECHNIQUE: Multidetector CT imaging of the head and cervical spine was performed following the standard protocol without intravenous contrast. Multiplanar CT image reconstructions of the cervical spine were also generated. COMPARISON:  09/17/2017 CT head and cervical spine. FINDINGS: CT HEAD FINDINGS Brain: No evidence of acute infarction, hemorrhage, hydrocephalus, extra-axial collection or mass lesion/mass effect. Stable moderate chronic microvascular ischemic changes and volume loss of the  brain. Stable chronic lacunar infarct within left paramedian pons. Vascular: Calcific atherosclerosis of carotid siphons. No hyperdense vessel. Skull: Normal. Negative for fracture or focal lesion. Sinuses/Orbits: Normal aeration of the visible paranasal sinuses and mastoid air cells. Left intra-ocular lens replacement. Other: Right-sided ossicular replacement prosthesis. CT CERVICAL SPINE FINDINGS Alignment: Normal. Skull base and vertebrae: No acute fracture. No primary bone lesion or focal pathologic process. Chronic T1 spinous process fracture. Soft tissues and spinal canal: No prevertebral fluid or swelling. No visible canal hematoma. Disc levels: Intervertebral disc space heights are maintained. Small endplate marginal osteophytes are present at the C5-6 level. Upper chest: Negative. Other: Mild calcific atherosclerosis of the carotid bifurcations. IMPRESSION: CT head: 1. No acute intracranial abnormality or calvarial fracture. 2. Stable chronic microvascular ischemic changes, volume loss of the brain, and small chronic left paramedian pontine infarct. CT cervical spine: No acute fracture or dislocation. Chronic T1 spinous process  fracture. Electronically Signed   By: Kristine Garbe M.D.   On: 10/31/2017 16:24   Mr Brain Wo Contrast  Result Date: 11/01/2017 CLINICAL DATA:  [place] in the bathroom 3 days ago.  Possible stroke. EXAM: MRI HEAD WITHOUT CONTRAST TECHNIQUE: Multiplanar, multiecho pulse sequences of the brain and surrounding structures were obtained without intravenous contrast. COMPARISON:  CT studies 10/31/2017 FINDINGS: Brain: Diffusion imaging shows a 5 mm acute infarction affecting the cortical and subcortical brain at the deep insula/temporoparietal junction on the left. No swelling, hemorrhage or mass effect. No other acute infarction. There chronic small-vessel ischemic changes of the pons. There are a few old small vessel cerebellar infarctions. Cerebral hemispheres elsewhere show chronic small-vessel ischemic changes the white matter. No large vessel territory infarction. Mass lesion, hemorrhage, hydrocephalus or extra-axial collection. Vascular: Major vessels at the base of the brain show flow. Skull and upper cervical spine: Negative Sinuses/Orbits: Clear/normal Other: None IMPRESSION: 5 mm acute infarction affecting the cortical and subcortical brain at the deep insula/temporoparietal junction region on the left. No mass effect or hemorrhage. Atrophy and extensive chronic small-vessel ischemic changes elsewhere. Electronically Signed   By: Nelson Chimes M.D.   On: 11/01/2017 12:41   Dg Shoulder Left  Result Date: 10/31/2017 CLINICAL DATA:  Fall 2 days ago with left shoulder pain, initial encounter EXAM: LEFT SHOULDER - 2+ VIEW COMPARISON:  06/26/2014 FINDINGS: Degenerative changes of the acromioclavicular joint are noted. No acute fracture or dislocation is seen. No soft tissue abnormality is noted. The underlying bony thorax is within normal limits. IMPRESSION: Mild degenerative change without acute abnormality. Electronically Signed   By: Inez Catalina M.D.   On: 10/31/2017 16:13      Scheduled  Meds: . ALPRAZolam  0.5 mg Oral TID  . amiodarone  200 mg Oral Daily  . apixaban  5 mg Oral BID  . atorvastatin  40 mg Oral Daily  . chlorhexidine  15 mL Mouth Rinse BID  . divalproex  250 mg Oral Q8H  . guaiFENesin  1,200 mg Oral BID  . levETIRAcetam  500 mg Oral BID  . mouth rinse  15 mL Mouth Rinse q12n4p  . mometasone-formoterol  2 puff Inhalation BID  . multivitamin with minerals  1 tablet Oral Daily  . nicotine  21 mg Transdermal Daily  . oxybutynin  5 mg Oral TID  . pantoprazole  40 mg Oral Q1200  . potassium chloride SA  20 mEq Oral QODAY  . predniSONE  5 mg Oral Q breakfast  . risperiDONE  1 mg Oral BID   Continuous  Infusions: . sodium chloride Stopped (11/01/17 1419)     LOS: 1 day    Time spent in minutes: 35    Debbe Odea, MD [facility] Pager: [email or web address] Password [email or web address] 11/03/2017, 10:06 AM

## 2017-11-03 NOTE — Progress Notes (Signed)
OT Cancellation Note  Patient Details Name: [name] MRN: [id number] DOB: 10-10-1950   Cancelled Treatment:     Patient was lethargic. Patient will need a 2 person assist.  Yobani Schertzer 11/03/2017, 10:56 AM

## 2017-11-03 NOTE — Evaluation (Signed)
SLP Cancellation Note  Patient Details Name: TIERNAN SUTO MRN: [id number] DOB: 04/07/1950   Cancelled treatment:       Reason Eval/Treat Not Completed: (pt passed RNSSS, will continue efforts for cog eval, please reorder swallow evaluation if needed)   Macario Golds 11/03/2017, 12:24 PM   Luanna Salk, [place] [facility] SLP [place] Pager [phone] Office [phone]

## 2017-11-03 NOTE — Progress Notes (Signed)
Initial Nutrition Assessment  DOCUMENTATION CODES:   Obesity unspecified  INTERVENTION:   Provide Juven Fruit Punch BID, each serving provides 95kcal and 2.5g of protein (amino acids glutamine and arginine)  NUTRITION DIAGNOSIS:   Increased nutrient needs related to wound healing as evidenced by estimated needs.  GOAL:   Patient will meet greater than or equal to 90% of their needs  MONITOR:   PO intake, Supplement acceptance, Labs, Weight trends, I & O's, Skin  REASON FOR ASSESSMENT:   (Wound report)    ASSESSMENT:   67 y.o. male with atrial fibrillation, diastolic dysfunction CHF, COPD, conversion disorder, asthma, right heart failure, chronic lower extremity stasis dermatitis who presented to the ER with generalized weakness, fall, and inability to ambulate. He was just discharged from rehab facility a couple of days before. He laid on the floor for 2 days.   Patient with lethargy today. SLP attempting to evaluate patient this admission, will await results if patient is able to participate. Per PO intake documentation, pt ate 85% of meals yesterday.  Given stage 2 buttocks wound, will order Juven supplements.  Pt walks with walker and presents with left sided weakness.   Per chart review, UBW is 230 lb. Pt has lost 22 lb since 7/24 (9% wt loss x 2 months, significant for time frame). Pt with mild edema. Suspect some weight fluctuations are d/t fluid.   Medications: Multivitamin with minerals daily, K-DUR tablet Labs reviewed: Mg/Phos WNL   NUTRITION - FOCUSED PHYSICAL EXAM:    Most Recent Value  Orbital Region  Moderate depletion  Upper Arm Region  No depletion  Thoracic and Lumbar Region  Unable to assess  Buccal Region  Mild depletion  Temple Region  Moderate depletion  Clavicle Bone Region  Mild depletion  Clavicle and Acromion Bone Region  Mild depletion  Scapular Bone Region  Unable to assess  Dorsal Hand  Mild depletion  Patellar Region  Unable to assess   Anterior Thigh Region  Unable to assess  Posterior Calf Region  Unable to assess  Edema (RD Assessment)  Mild       Diet Order:   Diet Order            Diet Heart Room service appropriate? Yes; Fluid consistency: Thin  Diet effective now              EDUCATION NEEDS:   Not appropriate for education at this time  Skin:  Skin Assessment: Skin Integrity Issues: Skin Integrity Issues:: Stage II Stage II: buttocks   Last BM:  PTA  Height:   Ht Readings from Last 1 Encounters:  11/01/17 5\' 3"  (1.6 m)    Weight:   Wt Readings from Last 1 Encounters:  11/02/17 95.7 kg    Ideal Body Weight:  56.4 kg  BMI:  Body mass index is 37.38 kg/m.  Estimated Nutritional Needs:   Kcal:  1500-1700  Protein:  75-85g  Fluid:  1.7L/day  Clayton Bibles, MS, RD, LDN [place] [facility] Pager: [phone] After Hours Pager: [phone]

## 2017-11-04 DIAGNOSIS — G9341 Metabolic encephalopathy: Secondary | ICD-10-CM

## 2017-11-04 DIAGNOSIS — T07XXXA Unspecified multiple injuries, initial encounter: Secondary | ICD-10-CM

## 2017-11-04 DIAGNOSIS — J410 Simple chronic bronchitis: Secondary | ICD-10-CM

## 2017-11-04 MED ORDER — ACETAMINOPHEN 325 MG PO TABS: 650.0000 mg | ORAL_TABLET | Freq: Four times a day (QID) | ORAL | Status: AC | PRN

## 2017-11-04 MED ORDER — JUVEN PO PACK: 1.0000 | PACK | Freq: Two times a day (BID) | ORAL | 0 refills | Status: AC

## 2017-11-04 NOTE — Progress Notes (Signed)
Pt. Wristband would not scan.  Identity verified with MRN and DOB.

## 2017-11-04 NOTE — Evaluation (Signed)
Occupational Therapy Evaluation Patient Details Name: Patrick Holt MRN: [id number] DOB: [birth year] Today's Date: 11/04/2017    History of Present Illness Pt is a 67 y.o. M with significant PMH of multiple comorbidities including atrial fibrillation, diastolic dysfunction CHF, COPD, conversion disorder, asthma, right hear failure, chronic lower extremity stasis dermatitis. Previous hospitalization for PE, now admited s/p fall with prolonged down time. Acute small L parietal CVA   Clinical Impression   This pt was recently at rehab and had returned home; readmitted with fall and new CVA.  Pt has aide, for limited support. He has a significant tremor which impairs ADLs.  He reports he could eat finger food and he demonstrates that he can use a cup with straw. Will follow in acute setting with the goals listed below. He needs SNF or assistance for all mobility and adls including use of urinal. Recommend SNF.    Follow Up Recommendations  SNF    Equipment Recommendations  None recommended by OT    Recommendations for Other Services       Precautions / Restrictions Precautions Precautions: Fall Precaution Comments: multiple falls Restrictions Weight Bearing Restrictions: No      Mobility Bed Mobility               General bed mobility comments: unable to roll with one assist  Transfers                 General transfer comment: unable    Balance       Sitting balance - Comments: not tested: max A for balance with PT                                   ADL either performed or assessed with clinical judgement   ADL   Eating/Feeding: Maximal assistance;Bed level   Grooming: Maximal assistance Grooming Details (indicate cue type and reason): interference from tremor Upper Body Bathing: Maximal assistance   Lower Body Bathing: Total assistance;+2 for physical assistance   Upper Body Dressing : Total assistance   Lower Body Dressing: Total  assistance;+2 for physical assistance                 General ADL Comments: unable to roll with +1 assist     Vision Baseline Vision/History: (reports no change:  blind R eye)       Perception     Praxis      Pertinent Vitals/Pain Pain Assessment: Faces Faces Pain Scale: Hurts whole lot Pain Location: generalized (specifically L arm, neck, back with movement) Pain Descriptors / Indicators: Aching;Grimacing;Sore;Tightness;Moaning Pain Intervention(s): Limited activity within patient's tolerance;Monitored during session     Hand Dominance Right   Extremity/Trunk Assessment Upper Extremity Assessment Upper Extremity Assessment: RUE deficits/detail RUE Deficits / Details: AROM to 90; tremors worsen with use RUE Coordination: decreased gross motor;decreased fine motor LUE Deficits / Details: painful with movement and edemenous.  Able to wiggle fingers only.  Used pillow to reposition for edema; tolerated supination to almost neutral.  PROM shoulder approximately 20 supporting arm with pillow  nonfunctional LUE Coordination: decreased fine motor;decreased gross motor       Cervical / Trunk Assessment Cervical / Trunk Exceptions: keeping head turned R.  could not bring fully to midline   Communication Communication Communication: No difficulties   Cognition Arousal/Alertness: Awake/alert Behavior During Therapy: WFL for tasks assessed/performed Overall Cognitive Status: No family/caregiver present to determine baseline  cognitive functioning                       Memory: Decreased short-term memory             General Comments       Exercises     Shoulder Instructions      Home Living Family/patient expects to be discharged to:: Unsure Living Arrangements: Alone Available Help at Discharge: Personal care attendant Type of Home: Apartment Home Access: Elevator     Home Layout: One level     Bathroom Shower/Tub: Radiographer, therapeutic: Standard     Home Equipment: Environmental consultant - 2 wheels;Cane - single point;Transport planner   Additional Comments: Senior apartment, but only home from rehab [facility]) a few hours prior to fall      Prior Functioning/Environment Level of Independence: Needs assistance        Comments: pt gave conflicting info to OT/PT.  Limited aide assistance.  He was home less than a day from rehab prior to readmit        OT Problem List: Decreased range of motion;Decreased activity tolerance;Impaired balance (sitting and/or standing);Decreased coordination;Decreased cognition;Decreased safety awareness;Decreased knowledge of use of DME or AE;Decreased knowledge of precautions;Cardiopulmonary status limiting activity;Obesity;Impaired UE functional use;Decreased strength      OT Treatment/Interventions: Self-care/ADL training;Therapeutic exercise;Energy conservation;DME and/or AE instruction;Therapeutic activities;Patient/family education;Balance training    OT Goals(Current goals can be found in the care plan section) Acute Rehab OT Goals Patient Stated Goal: none stated OT Goal Formulation: With patient Time For Goal Achievement: 11/18/17 Potential to Achieve Goals: Fair ADL Goals Pt Will Perform Grooming: with min assist;bed level Additional ADL Goal #1: pt will roll with max A from hospital bed with rails for adls Additional ADL Goal #2: pt will sit unsupported EOB with mod A in preparation for adls Additional ADL Goal #3: Pt will use urinal from bed level with min A  OT Frequency:     Barriers to D/C: Decreased caregiver support          Co-evaluation              AM-PAC PT "6 Clicks" Daily Activity     Outcome Measure Help from another person eating meals?: A Lot Help from another person taking care of personal grooming?: A Lot Help from another person toileting, which includes using toliet, bedpan, or urinal?: Total Help from another person bathing (including washing,  rinsing, drying)?: A Lot Help from another person to put on and taking off regular upper body clothing?: A Lot Help from another person to put on and taking off regular lower body clothing?: Total 6 Click Score: 10   End of Session    Activity Tolerance: Patient limited by fatigue;Patient limited by pain Patient left: in bed;with call bell/phone within reach  OT Visit Diagnosis: Muscle weakness (generalized) (M62.81);Hemiplegia and hemiparesis Hemiplegia - Right/Left: Left Hemiplegia - dominant/non-dominant: Non-Dominant                Time: 5916-3846 OT Time Calculation (min): 18 min Charges:  OT General Charges $OT Visit: 1 Visit OT Evaluation $OT Eval Moderate Complexity: [place], OTR/L Acute Rehabilitation Services [phone] [facility] pager [phone] office 11/04/2017  Benson 11/04/2017, 10:42 AM

## 2017-11-04 NOTE — Discharge Summary (Signed)
Physician Discharge Summary  DARIEN KADING CHY:[id number] DOB: Jul 05, 1950 DOA: 10/31/2017  PCP: [facility]  Admit date: 10/31/2017 Discharge date: 11/04/2017  Admitted From: home Disposition:  SNF   THIS IS A PRELIMINARY D/C SUMMARY WHICH WILL BE UPDATED ONCE THE PATIENT HAS A SNF BED.   Discharge Condition: stable CODE STATUS:  Full code  Consultants:   Neuro    Discharge Diagnoses:  Principal Problem:   Acute metabolic encephalopathy Active Problems:   Left-sided weakness   Fall   DEPRESSION, MAJOR   COPD (chronic obstructive pulmonary disease) (HCC)   Atrial fibrillation (HCC)   Difficulty in walking, not elsewhere classified   Seizure disorder ([place])   History of pulmonary embolism   Brief Summary: [name] with atrial fibrillation, diastolic dysfunction CHF, COPD, conversion disorder, asthma, right heart failure, chronic lower extremity stasis dermatitis who presented to the ER with generalized weakness, fall, and inability to ambulate. He was just discharged from rehab facility a couple of days before. He laid on the floor for 2 days.   Hospital Course:  Principal Problem:   Difficulty in walking/ falls - PT eval pending >> will likely need SNF  Active Problems: CVA - likely incidental finding and not related to left arm weakness - has h/o A-fib- cont Eliquis which he did miss for 2 days while he was on the floor - MRI-  5 mm acute infarction affecting the cortical and subcortical brain at the deep insula/temporoparietal junction region on the left. No mass effect or hemorrhage. Atrophy and extensive chronic small-vessel ischemic changes elsewhere. - Neuro assisting with management - Carotid duplex >> Bilateral ICAs 1-39% stenosis. Bilateral vertebral arteries patent with antegrade flow - 2 D ECHO> no thrombus- see report below    Acute encephalopathy   Seizure disorder   - Note on 9/17> "difficult to arouse and eyes rolled  back and fluttering" - ? whether he had a seizure- was loaded with Keppra 1.5 gm - STAT EEG unrevealing- see below - cont Keppra and Depakote-      Left-sided weakness  - suspect he laid on his arm while he was on the floor - he is quite tender - xray shoulder unrevealing- Ortho eval requested - Dr Lucia Gaskins recommending PT- axillary view xray of shoulder ordered and this is unrevealing-  - cont PRN Oxycodone  Severe upper extremity tremors - needs to be fed meals  Low grade temp - 100.5 on 9/18 AM- has not recurred - CXR on 9/16> right basilar atelectasis-   - UA> rare bacteria, 6-10 WBC, neg nitrites - no diarrhea and no leukocytosis - ? Due to atelectasis- start IS - follow fever curve   Depression - Risperdal    COPD (chronic obstructive pulmonary disease)  Nicotine abuse - stable- cont nebs and Prednisone - cont Nicotine patch    Atrial fibrillation   - cont Amiodarone- d/c'd Heparin infusion and resumed Eliquis on 9/19    History of pulmonary embolism -cont Eliquis  GERD - cont PPI  Procedures:   EEG> Normal electroencephalogram, awakeand drowsy. There are no focal lateralizing or epileptiform features.  2 D ECHO  - Left ventricle: The cavity size was normal. Wall thickness was normal. Systolic function was normal. The estimated ejection fraction was in the range of 55% to 60%. Wall motion was normal; there were no regional wall motion abnormalities. - Aortic valve: There was mild stenosis. Valve area (VTI): 1.44 cm^2. Valve area (Vmax): 1.44 cm^2. Valve area (  Vmean): 1.49 cm^2. - Right ventricle: The cavity size was mildly dilated. - Pulmonary arteries: Systolic pressure was moderately increased. PA peak pressure: 51 mm Hg (S).   Discharge Exam: Vitals:   11/04/17 0454 11/04/17 0813  BP:    Pulse: 75   Resp:    Temp:    SpO2: 98% 94%   Vitals:   11/03/17 2030 11/04/17 0454 11/04/17 0454 11/04/17 0813  BP:  139/79    Pulse:   (!) 37 75   Resp:  19    Temp:  98.2 F (36.8 C)    TempSrc:  Oral    SpO2: 97% 96% 98% 94%  Weight:      Height:        General: Pt is alert, awake, not in acute distress Cardiovascular: RRR, S1/S2 +, no rubs, no gallops Respiratory: CTA bilaterally, no wheezing, no rhonchi Abdominal: Soft, NT, ND, bowel sounds + Extremities: no edema, no cyanosis   Discharge Instructions  Discharge Instructions    Diet - low sodium heart healthy   Complete by:  As directed    Increase activity slowly   Complete by:  As directed      Allergies as of 11/04/2017      Reactions   Immune Globulins Other (See Comments)   Acute renal failure   Rho (d) Immune Globulin    Acute renal failure Acute renal failure   Ciprofloxacin Swelling   Flagyl [metronidazole] Swelling   Lisinopril Diarrhea   Sulfa Antibiotics Other (See Comments)   blisters   Sulfasalazine    blisters blisters   Cefepime Other (See Comments)   Diffuse maculopapular rash when receiving both vanc and cefepime, timing unclear.   Morphine And Related Other (See Comments)   Ineffective for pain management   Requip [ropinirole Hcl] Other (See Comments)   constipation   Vancomycin    Diffuse maculopapular rash when receiving both vanc/cefepime.  Timing unclear.     Betamethasone Dipropionate Other (See Comments)   Unknown   Betamethasone Dipropionate    Unknown   Bupropion    Unknown   Bupropion Hcl Other (See Comments)   Unknown   Clobetasol Other (See Comments)   Unknown Unknown   Codeine Other (See Comments)   Does not help with pain   Escitalopram Oxalate Other (See Comments)   Unknown   Escitalopram Oxalate    Unknown   Fluoxetine Hcl Other (See Comments)   Unknown   Fluoxetine Hcl    Unknown   Furosemide Other (See Comments)   Unknown Unknown   Meclizine Rash   Paroxetine Other (See Comments)   Unknown   Penicillins Other (See Comments)   Unknown reaction Has patient had a PCN reaction causing  immediate rash, facial/tongue/throat swelling, SOB or lightheadedness with hypotension: unknown Has patient had a PCN reaction causing severe rash involving mucus membranes or skin necrosis: unknown Has patient had a PCN reaction that required hospitalization unknown Has patient had a PCN reaction occurring within the last 10 years: unknown If all of the above answers are "NO", then may proceed with Cephalosporin use. Unknown Unknown reactio   Tacrolimus Other (See Comments)   Unknown   Tetanus Toxoid Other (See Comments)   Unknown   Tuberculin Purified Protein Derivative Other (See Comments)   Unknown      Medication List    TAKE these medications   acetaminophen 325 MG tablet Commonly known as:  TYLENOL Take 2 tablets (650 mg total) by mouth every 6 (  six) hours as needed for mild pain (or Fever >/= 101).   ALPRAZolam 0.5 MG tablet Commonly known as:  XANAX Take 1 tablet (0.5 mg total) by mouth 3 (three) times daily.   amiodarone 200 MG tablet Commonly known as:  PACERONE Take 1 tablet (200 mg total) by mouth daily.   apixaban 5 MG Tabs tablet Commonly known as:  ELIQUIS Take 10 mg oral twice daily for total of  days, then transition to 5 mg oral twice daily on 10/21/2017   atorvastatin 40 MG tablet Commonly known as:  LIPITOR Take 1 tablet (40 mg total) by mouth daily.   budesonide-formoterol 160-4.5 MCG/ACT inhaler Commonly known as:  SYMBICORT Inhale 2 puffs into the lungs 2 (two) times daily.   divalproex 250 MG DR tablet Commonly known as:  DEPAKOTE Take 1 tablet (250 mg total) by mouth 3 (three) times daily.   ergocalciferol 50000 units capsule Commonly known as:  VITAMIN D2 Take 1 capsule (50,000 Units total) by mouth once a week.   esomeprazole 40 MG capsule Commonly known as:  NEXIUM Take 1 capsule (40 mg total) by mouth daily.   guaiFENesin 600 MG 12 hr tablet Commonly known as:  MUCINEX Take 2 tablets (1,200 mg total) by mouth 2 (two) times daily.    ipratropium-albuterol 0.5-2.5 (3) MG/3ML Soln Commonly known as:  DUONEB Inhale 3 mLs into the lungs every 6 (six) hours as needed. What changed:  reasons to take this   levETIRAcetam 500 MG tablet Commonly known as:  KEPPRA Take 1 tablet (500 mg total) by mouth 2 (two) times daily.   loperamide 2 MG capsule Commonly known as:  IMODIUM Take 1 capsule (2 mg total) by mouth as needed for diarrhea or loose stools.   multivitamin with minerals Tabs tablet Take 1 tablet by mouth daily.   nicotine 21 mg/24hr patch Commonly known as:  NICODERM CQ - dosed in mg/24 hours Place 1 patch (21 mg total) onto the skin daily.   nutrition supplement (JUVEN) Pack Take 1 packet by mouth 2 (two) times daily between meals.   oxybutynin 5 MG tablet Commonly known as:  DITROPAN Take 1 tablet (5 mg total) by mouth 3 (three) times daily.   Potassium Chloride ER 20 MEQ Tbcr Take 20 mEq by mouth every other day.   predniSONE 10 MG tablet Commonly known as:  DELTASONE Take 0.5 tablets (5 mg total) by mouth daily with breakfast.   risperiDONE 1 MG tablet Commonly known as:  RISPERDAL Take 1 tablet (1 mg total) by mouth 2 (two) times daily.   senna-docusate 8.6-50 MG tablet Commonly known as:  Senokot-S Take 1 tablet by mouth at bedtime as needed for mild constipation.       Allergies  Allergen Reactions  . Immune Globulins Other (See Comments)    Acute renal failure  . Rho (D) Immune Globulin     Acute renal failure Acute renal failure  . Ciprofloxacin Swelling  . Flagyl [Metronidazole] Swelling  . Lisinopril Diarrhea  . Sulfa Antibiotics Other (See Comments)    blisters  . Sulfasalazine     blisters blisters  . Cefepime Other (See Comments)    Diffuse maculopapular rash when receiving both vanc and cefepime, timing unclear.  . Morphine And Related Other (See Comments)    Ineffective for pain management  . Requip [Ropinirole Hcl] Other (See Comments)    constipation  .  Vancomycin     Diffuse maculopapular rash when receiving both vanc/cefepime.  Timing unclear.    . Betamethasone Dipropionate Other (See Comments)    Unknown  . Betamethasone Dipropionate     Unknown  . Bupropion     Unknown  . Bupropion Hcl Other (See Comments)    Unknown  . Clobetasol Other (See Comments)    Unknown Unknown  . Codeine Other (See Comments)    Does not help with pain  . Escitalopram Oxalate Other (See Comments)    Unknown  . Escitalopram Oxalate     Unknown  . Fluoxetine Hcl Other (See Comments)    Unknown  . Fluoxetine Hcl     Unknown  . Furosemide Other (See Comments)    Unknown Unknown  . Meclizine Rash  . Paroxetine Other (See Comments)    Unknown  . Penicillins Other (See Comments)    Unknown reaction Has patient had a PCN reaction causing immediate rash, facial/tongue/throat swelling, SOB or lightheadedness with hypotension: unknown Has patient had a PCN reaction causing severe rash involving mucus membranes or skin necrosis: unknown Has patient had a PCN reaction that required hospitalization unknown Has patient had a PCN reaction occurring within the last 10 years: unknown If all of the above answers are "NO", then may proceed with Cephalosporin use. Unknown Unknown reactio  . Tacrolimus Other (See Comments)    Unknown  . Tetanus Toxoid Other (See Comments)    Unknown  . Tuberculin Purified Protein Derivative Other (See Comments)    Unknown     Procedures/Studies:    Dg Chest 2 View  Result Date: 10/31/2017 CLINICAL DATA:  Fall 2 days ago with persistent chest pain, initial encounter EXAM: CHEST - 2 VIEW COMPARISON:  10/14/2017 FINDINGS: Cardiac shadow is within normal limits. The lungs are well aerated bilaterally. Minimal right basilar atelectasis is seen. No pneumothorax is. Degenerative changes of the thoracic spine are seen. No definitive rib fractures are noted. IMPRESSION: Mild right basilar atelectasis. No pneumothorax or rib  abnormality is seen. Electronically Signed   By: Inez Catalina M.D.   On: 10/31/2017 16:12   Dg Pelvis 1-2 Views  Result Date: 10/31/2017 CLINICAL DATA:  Recent fall 2 days ago with pelvic pain, initial encounter EXAM: PELVIS - 1 VIEW COMPARISON:  None. FINDINGS: Pelvic ring is intact. No acute fracture or dislocation is seen. No gross soft tissue abnormality is noted. IMPRESSION: No acute abnormality noted. Electronically Signed   By: Inez Catalina M.D.   On: 10/31/2017 16:14   Dg Shoulder 1v Left  Result Date: 11/02/2017 CLINICAL DATA:  Shoulder pain EXAM: LEFT SHOULDER - 1 VIEW COMPARISON:  10/31/2017 FINDINGS: Axillary view shows normal alignment.  No obvious fracture. IMPRESSION: No acute osseous abnormality Electronically Signed   By: Donavan Foil M.D.   On: 11/02/2017 19:22   Ct Head Wo Contrast  Result Date: 10/31/2017 CLINICAL DATA:  67 y/o M; fall to floor unable to move since Saturday. EXAM: CT HEAD WITHOUT CONTRAST CT CERVICAL SPINE WITHOUT CONTRAST TECHNIQUE: Multidetector CT imaging of the head and cervical spine was performed following the standard protocol without intravenous contrast. Multiplanar CT image reconstructions of the cervical spine were also generated. COMPARISON:  09/17/2017 CT head and cervical spine. FINDINGS: CT HEAD FINDINGS Brain: No evidence of acute infarction, hemorrhage, hydrocephalus, extra-axial collection or mass lesion/mass effect. Stable moderate chronic microvascular ischemic changes and volume loss of the brain. Stable chronic lacunar infarct within left paramedian pons. Vascular: Calcific atherosclerosis of carotid siphons. No hyperdense vessel. Skull: Normal. Negative for fracture or focal lesion.  Sinuses/Orbits: Normal aeration of the visible paranasal sinuses and mastoid air cells. Left intra-ocular lens replacement. Other: Right-sided ossicular replacement prosthesis. CT CERVICAL SPINE FINDINGS Alignment: Normal. Skull base and vertebrae: No acute  fracture. No primary bone lesion or focal pathologic process. Chronic T1 spinous process fracture. Soft tissues and spinal canal: No prevertebral fluid or swelling. No visible canal hematoma. Disc levels: Intervertebral disc space heights are maintained. Small endplate marginal osteophytes are present at the C5-6 level. Upper chest: Negative. Other: Mild calcific atherosclerosis of the carotid bifurcations. IMPRESSION: CT head: 1. No acute intracranial abnormality or calvarial fracture. 2. Stable chronic microvascular ischemic changes, volume loss of the brain, and small chronic left paramedian pontine infarct. CT cervical spine: No acute fracture or dislocation. Chronic T1 spinous process fracture. Electronically Signed   By: Kristine Garbe M.D.   On: 10/31/2017 16:24   Ct Angio Chest Pe W And/or Wo Contrast  Result Date: 10/11/2017 CLINICAL DATA:  Cough and shortness of breath EXAM: CT ANGIOGRAPHY CHEST WITH CONTRAST TECHNIQUE: Multidetector CT imaging of the chest was performed using the standard protocol during bolus administration of intravenous contrast. Multiplanar CT image reconstructions and MIPs were obtained to evaluate the vascular anatomy. CONTRAST:  185mL ISOVUE-370 IOPAMIDOL (ISOVUE-370) INJECTION 76% COMPARISON:  CTA chest 04/06/2017 FINDINGS: Cardiovascular: --Pulmonary arteries: Contrast injection is sufficient to demonstrate satisfactory opacification of the pulmonary arteries to the segmental level.There is a saddle pulmonary embolus that extends along both the right and left pulmonary arteries and into the right upper lobar, inter lobar and left lower lobar arteries. Emboli extend into proximal segmental branches of all right lobes and the left lower lobe. The main pulmonary artery is within normal limits for size. No evidence of right heart strain. --Aorta: Limited opacification of the aorta due to bolus timing optimization for the pulmonary arteries. Conventional 3 vessel aortic  branching pattern. The aortic course and caliber are normal. There is mild aortic atherosclerosis. --Heart: Normal size. No pericardial effusion. Mediastinum/Nodes: No mediastinal, hilar or axillary lymphadenopathy. The visualized thyroid and thoracic esophageal course are unremarkable. Lungs/Pleura: There is bibasilar atelectasis. No pleural effusion or pneumothorax. No evidence of acute pulmonary infarction. Upper Abdomen: Contrast bolus timing is not optimized for evaluation of the abdominal organs. Within this limitation, the visualized organs of the upper abdomen are normal. Musculoskeletal: There is extravasated contrast in the visualized right arm extending into the right axilla. Review of the MIP images confirms the above findings. IMPRESSION: 1. Acute saddle pulmonary embolus with emboli extending into the left lower lobe are artery and multiple left lower lobe segmental branches and into all right-sided lobar branches and multiple proximal right segmental branches. 2. No CT evidence of right heart strain. 3. Extravasated contrast material within the right arm and axilla. Critical Value/emergent results were called by telephone at the time of interpretation on 10/11/2017 at 7:42 pm to Dr. Tomi Bamberger, who verbally acknowledged these results. Aortic Atherosclerosis (ICD10-I70.0). Electronically Signed   By: Ulyses Jarred M.D.   On: 10/11/2017 19:43   Ct Cervical Spine Wo Contrast  Result Date: 10/31/2017 CLINICAL DATA:  67 y/o M; fall to floor unable to move since Saturday. EXAM: CT HEAD WITHOUT CONTRAST CT CERVICAL SPINE WITHOUT CONTRAST TECHNIQUE: Multidetector CT imaging of the head and cervical spine was performed following the standard protocol without intravenous contrast. Multiplanar CT image reconstructions of the cervical spine were also generated. COMPARISON:  09/17/2017 CT head and cervical spine. FINDINGS: CT HEAD FINDINGS Brain: No evidence of acute infarction, hemorrhage,  hydrocephalus,  extra-axial collection or mass lesion/mass effect. Stable moderate chronic microvascular ischemic changes and volume loss of the brain. Stable chronic lacunar infarct within left paramedian pons. Vascular: Calcific atherosclerosis of carotid siphons. No hyperdense vessel. Skull: Normal. Negative for fracture or focal lesion. Sinuses/Orbits: Normal aeration of the visible paranasal sinuses and mastoid air cells. Left intra-ocular lens replacement. Other: Right-sided ossicular replacement prosthesis. CT CERVICAL SPINE FINDINGS Alignment: Normal. Skull base and vertebrae: No acute fracture. No primary bone lesion or focal pathologic process. Chronic T1 spinous process fracture. Soft tissues and spinal canal: No prevertebral fluid or swelling. No visible canal hematoma. Disc levels: Intervertebral disc space heights are maintained. Small endplate marginal osteophytes are present at the C5-6 level. Upper chest: Negative. Other: Mild calcific atherosclerosis of the carotid bifurcations. IMPRESSION: CT head: 1. No acute intracranial abnormality or calvarial fracture. 2. Stable chronic microvascular ischemic changes, volume loss of the brain, and small chronic left paramedian pontine infarct. CT cervical spine: No acute fracture or dislocation. Chronic T1 spinous process fracture. Electronically Signed   By: Kristine Garbe M.D.   On: 10/31/2017 16:24   Mr Brain Wo Contrast  Result Date: 11/01/2017 CLINICAL DATA:  [place] in the bathroom 3 days ago.  Possible stroke. EXAM: MRI HEAD WITHOUT CONTRAST TECHNIQUE: Multiplanar, multiecho pulse sequences of the brain and surrounding structures were obtained without intravenous contrast. COMPARISON:  CT studies 10/31/2017 FINDINGS: Brain: Diffusion imaging shows a 5 mm acute infarction affecting the cortical and subcortical brain at the deep insula/temporoparietal junction on the left. No swelling, hemorrhage or mass effect. No other acute infarction. There chronic  small-vessel ischemic changes of the pons. There are a few old small vessel cerebellar infarctions. Cerebral hemispheres elsewhere show chronic small-vessel ischemic changes the white matter. No large vessel territory infarction. Mass lesion, hemorrhage, hydrocephalus or extra-axial collection. Vascular: Major vessels at the base of the brain show flow. Skull and upper cervical spine: Negative Sinuses/Orbits: Clear/normal Other: None IMPRESSION: 5 mm acute infarction affecting the cortical and subcortical brain at the deep insula/temporoparietal junction region on the left. No mass effect or hemorrhage. Atrophy and extensive chronic small-vessel ischemic changes elsewhere. Electronically Signed   By: Nelson Chimes M.D.   On: 11/01/2017 12:41   Dg Chest Port 1 View  Result Date: 10/14/2017 CLINICAL DATA:  67 year old male with shortness of breath EXAM: PORTABLE CHEST 1 VIEW COMPARISON:  Chest radiograph dated 10/11/2017 and CT dated 10/11/2017 FINDINGS: Bilateral lower lobe airspace densities may represent atelectasis versus infiltrate, new since the radiograph of 10/11/2017. No pleural effusion or pneumothorax. Top-normal cardiac size. No acute osseous pathology. IMPRESSION: Bibasilar atelectasis/infiltrate. Electronically Signed   By: Anner Crete M.D.   On: 10/14/2017 21:09   Dg Chest Portable 1 View  Result Date: 10/11/2017 CLINICAL DATA:  67 year old male with hypotension. Bilateral lower extremity edema. EXAM: PORTABLE CHEST 1 VIEW COMPARISON:  09/17/2017 and earlier chest radiographs. FINDINGS: Portable AP semi upright view at 1436 hours. The patient is more rotated to the right. Mildly improved lung volumes and bibasilar ventilation. No pulmonary edema, pneumothorax or consolidation. No definite pleural effusion. Stable cardiac size and mediastinal contours. Cardiac size within normal limits. Visualized tracheal air column is within normal limits. Negative visible bowel gas pattern. No acute  osseous abnormality identified. IMPRESSION: No acute cardiopulmonary abnormality. Electronically Signed   By: Genevie Ann M.D.   On: 10/11/2017 14:49   Dg Shoulder Left  Result Date: 10/31/2017 CLINICAL DATA:  Fall 2 days ago with left shoulder  pain, initial encounter EXAM: LEFT SHOULDER - 2+ VIEW COMPARISON:  06/26/2014 FINDINGS: Degenerative changes of the acromioclavicular joint are noted. No acute fracture or dislocation is seen. No soft tissue abnormality is noted. The underlying bony thorax is within normal limits. IMPRESSION: Mild degenerative change without acute abnormality. Electronically Signed   By: Inez Catalina M.D.   On: 10/31/2017 16:13   [place] Ekg Site Rite  Result Date: 10/15/2017 If Site Rite image not attached, placement could not be confirmed due to current cardiac rhythm.    The results of significant diagnostics from this hospitalization (including imaging, microbiology, ancillary and laboratory) are listed below for reference.     Microbiology: No results found for this or any previous visit (from the past 240 hour(s)).   Labs: BNP (last 3 results) Recent Labs    09/03/17 1901 09/17/17 1201 10/11/17 1454  BNP 36.1 33.3 15.7   Basic Metabolic Panel: Recent Labs  Lab 10/31/17 1459 11/01/17 0837 11/02/17 0549  NA 147* 145 143  K 3.5 3.3* 3.9  CL 105 109 109  CO2 28 27 26   GLUCOSE 93 101* 92  BUN 13 16 15   CREATININE 0.90 0.91 0.82  CALCIUM 9.5 8.9 8.7*  MG  --  1.9 1.9  PHOS  --  3.9 3.4   Liver Function Tests: Recent Labs  Lab 10/31/17 1459 11/01/17 0837 11/02/17 0549  AST 21 22 20   ALT 17 17 17   ALKPHOS 68 55 50  BILITOT 1.6* 1.2 0.8  PROT 7.0 5.8* 5.3*  ALBUMIN 3.3* 2.6* 2.3*   No results for input(s): LIPASE, AMYLASE in the last 168 hours. Recent Labs  Lab 11/01/17 2214  AMMONIA 43*   CBC: Recent Labs  Lab 10/31/17 1459 11/01/17 0837 11/02/17 0549 11/03/17 0536  WBC 13.6* 8.7 7.5 7.0  NEUTROABS 11.2* 6.3 4.2  --   HGB 14.3 12.7*  11.7* 11.7*  HCT 44.5 39.6 37.5* 36.7*  MCV 91.6 91.9 93.3 91.5  PLT 387 314 304 269   Cardiac Enzymes: Recent Labs  Lab 10/31/17 1459  CKTOTAL 447*   BNP: Invalid input(s): POCBNP CBG: No results for input(s): GLUCAP in the last 168 hours. D-Dimer No results for input(s): DDIMER in the last 72 hours. Hgb A1c Recent Labs    11/01/17 1447  HGBA1C 5.4   Lipid Profile Recent Labs    11/01/17 1447  CHOL 97  HDL 38*  LDLCALC 47  TRIG 59  CHOLHDL 2.6   Thyroid function studies No results for input(s): TSH, T4TOTAL, T3FREE, THYROIDAB in the last 72 hours.  Invalid input(s): FREET3 Anemia work up No results for input(s): VITAMINB12, FOLATE, FERRITIN, TIBC, IRON, RETICCTPCT in the last 72 hours. Urinalysis    Component Value Date/Time   COLORURINE YELLOW 10/31/2017 1524   APPEARANCEUR CLEAR 10/31/2017 1524   LABSPEC 1.023 10/31/2017 1524   PHURINE 5.0 10/31/2017 1524   GLUCOSEU NEGATIVE 10/31/2017 1524   HGBUR SMALL (A) 10/31/2017 1524   HGBUR negative 12/11/2009 1356   BILIRUBINUR NEGATIVE 10/31/2017 1524   BILIRUBINUR MODERATE 06/05/2015 1407   KETONESUR 80 (A) 10/31/2017 1524   PROTEINUR 100 (A) 10/31/2017 1524   UROBILINOGEN 2.0 06/05/2015 1407   UROBILINOGEN 1.0 12/30/2014 1830   NITRITE NEGATIVE 10/31/2017 1524   LEUKOCYTESUR NEGATIVE 10/31/2017 1524   Sepsis Labs Invalid input(s): PROCALCITONIN,  WBC,  LACTICIDVEN Microbiology No results found for this or any previous visit (from the past 240 hour(s)).   Time coordinating discharge in minutes: 65  SIGNED:  Debbe Odea, MD  [facility] 11/04/2017, 1:20 PM Pager   If 7PM-7AM, please contact night-coverage [email or web address] Password [email or web address]

## 2017-11-04 NOTE — Evaluation (Signed)
Speech Language Pathology Evaluation Patient Details Name: Patrick Holt MRN: [id number] DOB: February 24, 1950 Today's Date: 11/04/2017 Time: 2778-2423 SLP Time Calculation (min) (ACUTE ONLY): 16 min  Problem List:  Patient Active Problem List   Diagnosis Date Noted  . Fall 11/02/2017  . History of pulmonary embolism 10/31/2017  . Left-sided weakness 10/31/2017  . Anasarca 10/11/2017  . Pulmonary embolism ([place]) 10/11/2017  . ARF (acute renal failure) ([place]) 09/17/2017  . Unstable angina ([place]) 06/16/2017  . L1 and L2 vertebral fracture ([place]) 04/06/2017  . Multiple rib fractures 04/06/2017  . Evaluation by psychiatric service required 03/07/2017  . Acute metabolic encephalopathy 53/61/4431  . Hypoglycemia 03/04/2017  . Acute respiratory failure with hypoxia ([place]) 07-07-202019  . Acute encephalopathy 07-07-202019  . Rhabdomyolysis 02/05/2017  . Acute respiratory failure with hypoxia and hypercapnia ([place]) 02/05/2017  . COPD with acute exacerbation ([place]) 02/05/2017  . Bipolar 1 disorder ([place]) 02/05/2017  . Anemia 02/05/2017  . Hypertension 02/05/2017  . Hand fracture, right 02/05/2017  . Narcotic dependence ([place]) 02/05/2017  . Pre-diabetes 02/05/2017  . PAF (paroxysmal atrial fibrillation) ([place]) 02/05/2017  . Frequent falls 02/05/2017  . History of dermatomyositis 02/05/2017  . Seizure disorder ([place]) 02/05/2017  . Bilateral lower extremity edema 02/05/2017  . Pulmonary artery hypertension ([place]) 01/15/2017  . Altered mental status 01/13/2017  . Low oxygen saturation   . Pain in right wrist 02/27/2016  . Altered mental state 11/14/2015  . Severe dehydration 11/14/2015  . Hypernatremia 11/14/2015  . Difficulty in walking, not elsewhere classified   . Right wrist fracture   . Closed displaced fracture of ulnar styloid with routine healing   . Scapholunate ligament injury with no instability   . Falls frequently   . Hypoxia 11/02/2015  . Anxiety 06/06/2015  . Impaired mobility  04/28/2015  . Lumbar spondylosis with myelopathy 03/07/2015  . Nocturia 12/03/2014  . Chronic venous insufficiency 09/23/2014  . Poor social situation 07/30/2014  . Schizophrenia, acute ([place])   . Schizoaffective disorder, bipolar type ([place])   . Decreased visual acuity 06/27/2014  . COPD exacerbation ([place])   . Right sided weakness 01/04/2014  . Essential hypertension, benign   . Atrial fibrillation, unspecified   . Radiculopathy of cervical region   . Polyuria 12/07/2013  . Erectile dysfunction 12/07/2013  . Diverticulitis 08/01/2013  . Acute diverticulitis 08/01/2013  . Hematochezia 06/04/2013  . Dehydration 11/09/2012  . Hypokalemia 10/03/2012  . Benign neoplasm of colon 09/27/2012  . Stricture and stenosis of esophagus 09/27/2012  . Diverticulitis of colon without hemorrhage 07/26/2012  . Polypharmacy 03/26/2012  . Seizures ([place]) 02/10/2012  . Atrial fibrillation ([place]) 02/05/2012  . Coronary atherosclerosis of native coronary artery 02/05/2012  . Radicular low back pain 03/09/2011  . History of seizure disorder 01/29/2011  . Irritable bowel syndrome (IBS) 06/24/2010  . Urge incontinence 06/19/2010  . Tobacco abuse 11/23/2008  . DEPRESSION, MAJOR 05/31/2008  . ISCHEMIC HEART DISEASE 05/31/2008  . RAYNAUD'S DISEASE 05/31/2008  . HIATAL HERNIA 05/31/2008  . Rheumatoid arthritis ([place]) 05/31/2008  . FIBROMYALGIA 05/31/2008  . Hyperlipidemia 01/26/2008  . Anxiety state 01/26/2008  . HEMORRHOIDS, INTERNAL 01/26/2008  . COPD (chronic obstructive pulmonary disease) ([place]) 01/26/2008  . ESOPHAGEAL MOTILITY DISORDER 01/26/2008  . GERD 01/26/2008  . Dermatomyositis ([place]) 01/26/2008   Past Medical History:  Past Medical History:  Diagnosis Date  . Anemia   . Anxiety   . Asthma   . Atrial fibrillation ([place]) 01/2012   with RVR  . Bipolar 1 disorder ([place])   .  Cancer ([place])   . Collagen vascular disease ([place])   . Conversion disorder 06/2010  . COPD (chronic obstructive  pulmonary disease) ([place])   . Dermatomyositis ([place])   . Dermatomyositis ([place])   . Diverticulitis   . Diverticulosis of colon   . Dysrhythmia    "irregular" (11/15/2012)  . Esophageal dysmotility   . Esophageal stricture   . Fibromyalgia   . Gastritis   . GERD (gastroesophageal reflux disease)   . Hand fracture, right    sept 13, 2017, still wearing splint as og 01-10-2017  . Headache(784.0)    "severe; get them often" (11/15/2012)  . Hiatal hernia   . History of narcotic addiction ([place])   . Hx of adenomatous colonic polyps   . Hyperlipidemia   . Hypertension   . Internal hemorrhoids   . Ischemic heart disease   . Major depression    with acute psychotic break in 06/2010  . Myocardial infarct ([place])    mulitple (1999, 2000, 2004)  . Narcotic dependence ([place])   . Nephrolithiasis   . Obesity   . OCD (obsessive compulsive disorder)   . Otosclerosis   . Paroxysmal A-fib ([place])   . Peripheral neuropathy   . Raynaud's disease   . Renal insufficiency   . Rheumatoid arthritis(714.0)   . Sarcoidosis   . Seizures ([place])   . Subarachnoid hemorrhage ([place]) 01/2012   with subdural  hematoma.   . Type II diabetes mellitus ([place])   . Urge incontinence   . Vertigo    Past Surgical History:  Past Surgical History:  Procedure Laterality Date  . BACK SURGERY    . CARDIAC CATHETERIZATION    . CARPAL TUNNEL RELEASE Bilateral   . CATARACT EXTRACTION W/ INTRAOCULAR LENS IMPLANT Left   . COLONOSCOPY N/A 09/27/2012   Procedure: COLONOSCOPY;  Surgeon: Lafayette Dragon, MD;  Location: [facility] ENDOSCOPY;  Service: Endoscopy;  Laterality: N/A;  . ESOPHAGOGASTRODUODENOSCOPY N/A 09/27/2012   Procedure: ESOPHAGOGASTRODUODENOSCOPY (EGD);  Surgeon: Lafayette Dragon, MD;  Location: [facility];  Service: Endoscopy;  Laterality: N/A;  . KNEE ARTHROSCOPY W/ MENISCAL REPAIR Left 2009  . LUMBAR DISC SURGERY    . LYMPH NODE DISSECTION Right 1970's   "neck; dr thought I had Hodgkins; turned out to be sarcoidosis"  (11/15/2012)  . squamous papilloma   2010   removed by Dr. Constance Holster ENT, noted on tongue  . TONSILLECTOMY     HPI:  Pt is a 67 y.o. M with significant PMH of multiple comorbidities including atrial fibrillation, diastolic dysfunction CHF, COPD, conversion disorder, asthma, right heart failure, chronic lower extremity stasis dermatitis. Previous hospitalization for PE, now admited s/p fall with prolonged down time, encephalitis. 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    Assessment / Plan / Recommendation Clinical Impression  Pt with unknown cognitive baseline presents with fluent speech without dysarthria; normal expressive and receptive language.  Affect is flat;  deficits in short-term recall and confusion of verbal tasks apparent during session.  Insight is impaired.  No information available re: cognitive abilities PTA. Will need 24 hour supervision given current deficits.  No acute SLP tx warranted - recommend reassessment next level of care.     SLP Assessment  SLP Recommendation/Assessment: All further Speech Lanaguage Pathology  needs can be addressed in the next venue of care SLP Visit Diagnosis: Cognitive communication deficit (R41.841)    Follow Up Recommendations       Frequency and Duration  SLP Evaluation Cognition  Overall Cognitive Status: No family/caregiver present to determine baseline cognitive functioning Arousal/Alertness: Awake/alert Orientation Level: Oriented X4 Attention: Sustained Sustained Attention: Appears intact Memory: Impaired Memory Impairment: Retrieval deficit Awareness: Impaired Problem Solving: Impaired       Comprehension  Auditory Comprehension Overall Auditory Comprehension: Appears within functional limits for tasks assessed Reading Comprehension Reading Status: Not tested    Expression Expression Primary Mode of Expression: Verbal Verbal Expression Overall Verbal Expression: Appears within  functional limits for tasks assessed   Oral / Motor  Oral Motor/Sensory Function Overall Oral Motor/Sensory Function: Within functional limits Motor Speech Overall Motor Speech: Appears within functional limits for tasks assessed   GO                    Juan Quam Laurice 11/04/2017, 4:03 PM

## 2017-11-04 NOTE — Progress Notes (Addendum)
CSW consulted to assist with SNF placement. Pt well-known to [facility] service line from past encounters, most recently assessed 4 days ago at admission. Was at [facility] for past several weeks, per chart was at home for 2 days prior to hospital admission.   Pt has been to other SNFs in past several months as well and per report is in Medicare co-pay days which he is not agreeable or able to paying if he readmits. Would also need [facility] Medicare authorization to return to SNF. Unable to pay privately for long term care he reports (either at SNF or at ALF and reports he does not qualify for Medicaid "due to making too much money").   Pt has case worker with [facility] [phone]. CSW left voicemail requesting returned call in order to collaborate with DSS re pt's disposition.   Plan TBD.   Sharren Bridge, MSW, LCSW Clinical Social Work 11/04/2017 [phone] coverage for [phone]  16:00- spoke with DSS case worker (number above)- she states that only DSS services are in home aide hours. Pt has not been receiving these last few weeks as he has been in [facility] for rehab. Case worker states that DSS met with pt several days ago and he agreed to long term care placement if found. States that they have begun Medicaid application for him as he does not have funds to pay privately for long term care. States application is awaiting some documents which family is assisting with. Also states she is planning to come to hospital to work more with pt on application (needs ID card or social security card for application). CSW made referrals for pt for SNF seeking rehab however noted that likely placement is going to be for long term care with pending Medicaid.

## 2017-11-05 ENCOUNTER — Inpatient Hospital Stay (HOSPITAL_COMMUNITY): Payer: Medicare Other

## 2017-11-05 DIAGNOSIS — R609 Edema, unspecified: Secondary | ICD-10-CM

## 2017-11-05 DIAGNOSIS — M25512 Pain in left shoulder: Secondary | ICD-10-CM

## 2017-11-05 DIAGNOSIS — M7989 Other specified soft tissue disorders: Secondary | ICD-10-CM

## 2017-11-05 MED ORDER — ALBUTEROL SULFATE (2.5 MG/3ML) 0.083% IN NEBU: 2.5000 mg | INHALATION_SOLUTION | RESPIRATORY_TRACT | Status: DC | PRN

## 2017-11-05 MED ORDER — MORPHINE SULFATE (PF) 2 MG/ML IV SOLN
2.0000 mg | INTRAVENOUS | Status: DC | PRN
  Administered 2017-11-05 – 2017-11-10 (×6): 2 mg via INTRAVENOUS
  Filled 2017-11-05 (×6): qty 1

## 2017-11-05 NOTE — Progress Notes (Signed)
Left upper extremity venous duplex has been completed. Negative for obvious evidence of DVT.  11/05/17 10:36 AM Patrick Holt RVT

## 2017-11-05 NOTE — Progress Notes (Signed)
SBAR report given to Vera, RN.   

## 2017-11-05 NOTE — Progress Notes (Signed)
PROGRESS NOTE    Patrick Holt   OQH:[id number]  DOB: 08-26-1950  DOA: 10/31/2017 PCP: Medicine, [facility]   Brief Narrative:  Patrick Holt with atrial fibrillation, diastolic dysfunction CHF, COPD, conversion disorder, asthma, right heart failure, chronic lower extremity stasis dermatitis who presented to the ER with generalized weakness, fall, and inability to ambulate. He was just discharged from rehab facility a couple of days before. He laid on the floor for 2 days.    Subjective: Pain in left arm is not as severe as yesterday. He has no other complaints.     Assessment & Plan:   Principal Problem:   Difficulty in walking/ falls - PT eval pending >> will need SNF- awaiting bed  Active Problems: CVA - likely incidental finding and not related to left arm weakness - has h/o A-fib- cont Eliquis which he did miss for 2 days while he was on the floor - MRI-  5 mm acute infarction affecting the cortical and subcortical brain at the deep insula/temporoparietal junction region on the left. No mass effect or hemorrhage. Atrophy and extensive chronic small-vessel ischemic changes elsewhere. - Neuro assisting with management - Carotid duplex >> Bilateral ICAs 1-39% stenosis. Bilateral vertebral arteries patent with antegrade flow - 2 D ECHO> no thrombus- see report below  Acute encephalopathy   Seizure disorder   - Note on 9/17> "difficult to arouse and eyes rolled back and fluttering" - ? whether he had a seizure- was loaded with Keppra 1.5 gm - STAT EEG unrevealing- see below - cont Keppra and Depakote-    Severe upper extremity tremors - needs to be fed all meals  Low grade temp - 100.5 on 9/18 AM- has not recurred - CXR on 9/16> right basilar atelectasis-   - UA> rare bacteria, 6-10 WBC, neg nitrites - no diarrhea and no leukocytosis - ? Due to atelectasis- started IS -  No recurrence of fever- no leukocytosis- following closely   Depression -  Risperdal    COPD (chronic obstructive pulmonary disease)  Nicotine abuse - stable- cont nebs and Prednisone - cont Nicotine patch    Atrial fibrillation   - cont Amiodarone- d/c'd Heparin infusion and resumed Eliquis on 9/19    History of pulmonary embolism -cont Eliquis    Left-sided weakness  - suspect he laid on his arm while he was on the floor - he is quite tender - xray shoulder unrevealing- Ortho eval requested - Dr Lucia Gaskins recommending PT- axillary view xray of shoulder ordered and this is unrevealing-  - cont PRN Oxycodone  Left arm swelling - arm appears a little swollen today but venous duplex is negative for DVT - will elevate arm  GERD - cont PPI   DVT prophylaxis: Heparin infusion Code Status: Full code Family Communication:  Disposition Plan: likely will need SNF Consultants:   Neuro Procedures:   EEG> Normal electroencephalogram, awake and drowsy. There are no focal lateralizing or epileptiform features.  2 D ECHO  - Left ventricle: The cavity size was normal. Wall thickness was   normal. Systolic function was normal. The estimated ejection   fraction was in the range of 55% to 60%. Wall motion was normal;   there were no regional wall motion abnormalities. - Aortic valve: There was mild stenosis. Valve area (VTI): 1.44   cm^2. Valve area (Vmax): 1.44 cm^2. Valve area (Vmean): 1.49   cm^2. - Right ventricle: The cavity size was mildly dilated. - Pulmonary arteries: Systolic pressure  was moderately increased.   PA peak pressure: 51 mm Hg (S). Antimicrobials:  Anti-infectives (From admission, onward)   None       Objective: Vitals:   11/04/17 2147 11/05/17 0409 11/05/17 0821 11/05/17 0823  BP:  (!) 145/71    Pulse:  (!) 51    Resp:  (!) 24    Temp:  98.1 F (36.7 C)    TempSrc:  Oral    SpO2: 98% 100% 95% 95%  Weight:      Height:        Intake/Output Summary (Last 24 hours) at 11/05/2017 1121 Last data filed at 11/05/2017 0900 Gross  per 24 hour  Intake 690 ml  Output 750 ml  Net -60 ml   Filed Weights   11/02/17 0730  Weight: 95.7 kg    Examination: General exam: Appears comfortable  HEENT: PERRLA, oral mucosa moist, no sclera icterus or thrush Respiratory system: mild rhonchi today- Respiratory effort normal. Cardiovascular system: S1 & S2 heard, RRR.   Gastrointestinal system: Abdomen soft, non-tender, nondistended. Normal bowel sound. No organomegaly Central nervous system: Alert and oriented. Coarse upper extremity tremor- No focal neurological deficits. Extremities: No cyanosis, clubbing or edema- tenderness in left shoulder, upper arm and elbow- swelling in left forearm today Skin: No rashes or ulcers Psychiatry:  Mood & affect appropriate.   Data Reviewed: I have personally reviewed following labs and imaging studies  CBC: Recent Labs  Lab 10/31/17 1459 11/01/17 0837 11/02/17 0549 11/03/17 0536  WBC 13.6* 8.7 7.5 7.0  NEUTROABS 11.2* 6.3 4.2  --   HGB 14.3 12.7* 11.7* 11.7*  HCT 44.5 39.6 37.5* 36.7*  MCV 91.6 91.9 93.3 91.5  PLT 387 314 304 355   Basic Metabolic Panel: Recent Labs  Lab 10/31/17 1459 11/01/17 0837 11/02/17 0549  NA 147* 145 143  K 3.5 3.3* 3.9  CL 105 109 109  CO2 28 27 26   GLUCOSE 93 101* 92  BUN 13 16 15   CREATININE 0.90 0.91 0.82  CALCIUM 9.5 8.9 8.7*  MG  --  1.9 1.9  PHOS  --  3.9 3.4   GFR: Estimated Creatinine Clearance: 88.3 mL/min (by C-G formula based on SCr of 0.82 mg/dL). Liver Function Tests: Recent Labs  Lab 10/31/17 1459 11/01/17 0837 11/02/17 0549  AST 21 22 20   ALT 17 17 17   ALKPHOS 68 55 50  BILITOT 1.6* 1.2 0.8  PROT 7.0 5.8* 5.3*  ALBUMIN 3.3* 2.6* 2.3*   No results for input(s): LIPASE, AMYLASE in the last 168 hours. Recent Labs  Lab 11/01/17 2214  AMMONIA 43*   Coagulation Profile: No results for input(s): INR, PROTIME in the last 168 hours. Cardiac Enzymes: Recent Labs  Lab 10/31/17 1459  CKTOTAL 447*   BNP (last 3  results) No results for input(s): PROBNP in the last 8760 hours. HbA1C: No results for input(s): HGBA1C in the last 72 hours. CBG: No results for input(s): GLUCAP in the last 168 hours. Lipid Profile: No results for input(s): CHOL, HDL, LDLCALC, TRIG, CHOLHDL, LDLDIRECT in the last 72 hours. Thyroid Function Tests: No results for input(s): TSH, T4TOTAL, FREET4, T3FREE, THYROIDAB in the last 72 hours. Anemia Panel: No results for input(s): VITAMINB12, FOLATE, FERRITIN, TIBC, IRON, RETICCTPCT in the last 72 hours. Urine analysis:    Component Value Date/Time   COLORURINE YELLOW 10/31/2017 [place] 10/31/2017 1524   LABSPEC 1.023 10/31/2017 1524   PHURINE 5.0 10/31/2017 1524   GLUCOSEU NEGATIVE 10/31/2017 1524  HGBUR SMALL (A) 10/31/2017 1524   HGBUR negative 12/11/2009 1356   BILIRUBINUR NEGATIVE 10/31/2017 1524   BILIRUBINUR MODERATE 06/05/2015 1407   KETONESUR 80 (A) 10/31/2017 1524   PROTEINUR 100 (A) 10/31/2017 1524   UROBILINOGEN 2.0 06/05/2015 1407   UROBILINOGEN 1.0 12/30/2014 1830   NITRITE NEGATIVE 10/31/2017 1524   LEUKOCYTESUR NEGATIVE 10/31/2017 1524   Sepsis Labs: @LABRCNTIP (procalcitonin:4,lacticidven:4) )No results found for this or any previous visit (from the past 240 hour(s)).       Radiology Studies: Dg Chest 2 View  Result Date: 10/31/2017 CLINICAL DATA:  Fall 2 days ago with persistent chest pain, initial encounter EXAM: CHEST - 2 VIEW COMPARISON:  10/14/2017 FINDINGS: Cardiac shadow is within normal limits. The lungs are well aerated bilaterally. Minimal right basilar atelectasis is seen. No pneumothorax is. Degenerative changes of the thoracic spine are seen. No definitive rib fractures are noted. IMPRESSION: Mild right basilar atelectasis. No pneumothorax or rib abnormality is seen. Electronically Signed   By: Inez Catalina M.D.   On: 10/31/2017 16:12   Dg Pelvis 1-2 Views  Result Date: 10/31/2017 CLINICAL DATA:  Recent fall 2 days  ago with pelvic pain, initial encounter EXAM: PELVIS - 1 VIEW COMPARISON:  None. FINDINGS: Pelvic ring is intact. No acute fracture or dislocation is seen. No gross soft tissue abnormality is noted. IMPRESSION: No acute abnormality noted. Electronically Signed   By: Inez Catalina M.D.   On: 10/31/2017 16:14   Ct Head Wo Contrast  Result Date: 10/31/2017 CLINICAL DATA:  67 y/o M; fall to floor unable to move since Saturday. EXAM: CT HEAD WITHOUT CONTRAST CT CERVICAL SPINE WITHOUT CONTRAST TECHNIQUE: Multidetector CT imaging of the head and cervical spine was performed following the standard protocol without intravenous contrast. Multiplanar CT image reconstructions of the cervical spine were also generated. COMPARISON:  09/17/2017 CT head and cervical spine. FINDINGS: CT HEAD FINDINGS Brain: No evidence of acute infarction, hemorrhage, hydrocephalus, extra-axial collection or mass lesion/mass effect. Stable moderate chronic microvascular ischemic changes and volume loss of the brain. Stable chronic lacunar infarct within left paramedian pons. Vascular: Calcific atherosclerosis of carotid siphons. No hyperdense vessel. Skull: Normal. Negative for fracture or focal lesion. Sinuses/Orbits: Normal aeration of the visible paranasal sinuses and mastoid air cells. Left intra-ocular lens replacement. Other: Right-sided ossicular replacement prosthesis. CT CERVICAL SPINE FINDINGS Alignment: Normal. Skull base and vertebrae: No acute fracture. No primary bone lesion or focal pathologic process. Chronic T1 spinous process fracture. Soft tissues and spinal canal: No prevertebral fluid or swelling. No visible canal hematoma. Disc levels: Intervertebral disc space heights are maintained. Small endplate marginal osteophytes are present at the C5-6 level. Upper chest: Negative. Other: Mild calcific atherosclerosis of the carotid bifurcations. IMPRESSION: CT head: 1. No acute intracranial abnormality or calvarial fracture. 2.  Stable chronic microvascular ischemic changes, volume loss of the brain, and small chronic left paramedian pontine infarct. CT cervical spine: No acute fracture or dislocation. Chronic T1 spinous process fracture. Electronically Signed   By: Kristine Garbe M.D.   On: 10/31/2017 16:24   Ct Cervical Spine Wo Contrast  Result Date: 10/31/2017 CLINICAL DATA:  67 y/o M; fall to floor unable to move since Saturday. EXAM: CT HEAD WITHOUT CONTRAST CT CERVICAL SPINE WITHOUT CONTRAST TECHNIQUE: Multidetector CT imaging of the head and cervical spine was performed following the standard protocol without intravenous contrast. Multiplanar CT image reconstructions of the cervical spine were also generated. COMPARISON:  09/17/2017 CT head and cervical spine. FINDINGS: CT HEAD FINDINGS  Brain: No evidence of acute infarction, hemorrhage, hydrocephalus, extra-axial collection or mass lesion/mass effect. Stable moderate chronic microvascular ischemic changes and volume loss of the brain. Stable chronic lacunar infarct within left paramedian pons. Vascular: Calcific atherosclerosis of carotid siphons. No hyperdense vessel. Skull: Normal. Negative for fracture or focal lesion. Sinuses/Orbits: Normal aeration of the visible paranasal sinuses and mastoid air cells. Left intra-ocular lens replacement. Other: Right-sided ossicular replacement prosthesis. CT CERVICAL SPINE FINDINGS Alignment: Normal. Skull base and vertebrae: No acute fracture. No primary bone lesion or focal pathologic process. Chronic T1 spinous process fracture. Soft tissues and spinal canal: No prevertebral fluid or swelling. No visible canal hematoma. Disc levels: Intervertebral disc space heights are maintained. Small endplate marginal osteophytes are present at the C5-6 level. Upper chest: Negative. Other: Mild calcific atherosclerosis of the carotid bifurcations. IMPRESSION: CT head: 1. No acute intracranial abnormality or calvarial fracture. 2. Stable  chronic microvascular ischemic changes, volume loss of the brain, and small chronic left paramedian pontine infarct. CT cervical spine: No acute fracture or dislocation. Chronic T1 spinous process fracture. Electronically Signed   By: Kristine Garbe M.D.   On: 10/31/2017 16:24   Mr Brain Wo Contrast  Result Date: 11/01/2017 CLINICAL DATA:  [place] in the bathroom 3 days ago.  Possible stroke. EXAM: MRI HEAD WITHOUT CONTRAST TECHNIQUE: Multiplanar, multiecho pulse sequences of the brain and surrounding structures were obtained without intravenous contrast. COMPARISON:  CT studies 10/31/2017 FINDINGS: Brain: Diffusion imaging shows a 5 mm acute infarction affecting the cortical and subcortical brain at the deep insula/temporoparietal junction on the left. No swelling, hemorrhage or mass effect. No other acute infarction. There chronic small-vessel ischemic changes of the pons. There are a few old small vessel cerebellar infarctions. Cerebral hemispheres elsewhere show chronic small-vessel ischemic changes the white matter. No large vessel territory infarction. Mass lesion, hemorrhage, hydrocephalus or extra-axial collection. Vascular: Major vessels at the base of the brain show flow. Skull and upper cervical spine: Negative Sinuses/Orbits: Clear/normal Other: None IMPRESSION: 5 mm acute infarction affecting the cortical and subcortical brain at the deep insula/temporoparietal junction region on the left. No mass effect or hemorrhage. Atrophy and extensive chronic small-vessel ischemic changes elsewhere. Electronically Signed   By: Nelson Chimes M.D.   On: 11/01/2017 12:41   Dg Shoulder Left  Result Date: 10/31/2017 CLINICAL DATA:  Fall 2 days ago with left shoulder pain, initial encounter EXAM: LEFT SHOULDER - 2+ VIEW COMPARISON:  06/26/2014 FINDINGS: Degenerative changes of the acromioclavicular joint are noted. No acute fracture or dislocation is seen. No soft tissue abnormality is noted. The  underlying bony thorax is within normal limits. IMPRESSION: Mild degenerative change without acute abnormality. Electronically Signed   By: Inez Catalina M.D.   On: 10/31/2017 16:13      Scheduled Meds: . ALPRAZolam  0.5 mg Oral TID  . amiodarone  200 mg Oral Daily  . apixaban  5 mg Oral BID  . atorvastatin  40 mg Oral Daily  . chlorhexidine  15 mL Mouth Rinse BID  . divalproex  250 mg Oral Q8H  . guaiFENesin  1,200 mg Oral BID  . levETIRAcetam  500 mg Oral BID  . mouth rinse  15 mL Mouth Rinse q12n4p  . mometasone-formoterol  2 puff Inhalation BID  . multivitamin with minerals  1 tablet Oral Daily  . nicotine  21 mg Transdermal Daily  . nutrition supplement (JUVEN)  1 packet Oral BID BM  . oxybutynin  5 mg Oral TID  .  pantoprazole  40 mg Oral Q1200  . potassium chloride SA  20 mEq Oral QODAY  . predniSONE  5 mg Oral Q breakfast  . risperiDONE  1 mg Oral BID   Continuous Infusions:    LOS: 3 days    Time spent in minutes: 35    Debbe Odea, MD [facility] Pager: [email or web address] Password [facility] 11/05/2017, 11:21 AM

## 2017-11-05 NOTE — Care Management Important Message (Signed)
Important Message  Patient Details  Name: Patrick Holt MRN: [id number] Date of Birth: August 11, 1950   Medicare Important Message Given:  Yes    Erenest Rasher, RN 11/05/2017, 12:55 PM

## 2017-11-06 MED ORDER — POLYETHYLENE GLYCOL 3350 17 G PO PACK
17.0000 g | PACK | Freq: Every day | ORAL | Status: DC
  Administered 2017-11-07 – 2017-11-10 (×4): 17 g via ORAL
  Filled 2017-11-06 (×5): qty 1

## 2017-11-06 NOTE — Progress Notes (Signed)
PROGRESS NOTE    Patrick FORMBY   GEX:[id number]  DOB: Apr 28, 1950  DOA: 10/31/2017 PCP: Medicine, [facility]   Brief Narrative:  [name] with atrial fibrillation, diastolic dysfunction CHF, COPD, conversion disorder, asthma, right heart failure, chronic lower extremity stasis dermatitis who presented to the ER with generalized weakness, fall, and inability to ambulate. He was just discharged from rehab facility a couple of days before. He laid on the floor for 2 days.    Subjective: Pain in left arm is still a problem. He has no new complaints.     Assessment & Plan:   Principal Problem:   Difficulty in walking/ falls - PT eval pending >> will need SNF- awaiting bed  Active Problems: CVA - likely incidental finding and not related to left arm weakness - has h/o A-fib- cont Eliquis which he did miss for 2 days while he was on the floor - MRI-  5 mm acute infarction affecting the cortical and subcortical brain at the deep insula/temporoparietal junction region on the left. No mass effect or hemorrhage. Atrophy and extensive chronic small-vessel ischemic changes elsewhere. - Neuro assisting with management - Carotid duplex >> Bilateral ICAs 1-39% stenosis. Bilateral vertebral arteries patent with antegrade flow - 2 D ECHO> no thrombus- see report below  Acute encephalopathy   Seizure disorder   - Note on 9/17> "difficult to arouse and eyes rolled back and fluttering" - ? whether he had a seizure- was loaded with Keppra 1.5 gm - STAT EEG unrevealing- see below - cont Keppra and Depakote-    Severe upper extremity tremors - needs to be fed all meals  Low grade temp - 100.5 on 9/18 AM- has not recurred - CXR on 9/16> right basilar atelectasis-   - UA> rare bacteria, 6-10 WBC, neg nitrites - no diarrhea and no leukocytosis - ? Due to atelectasis- started IS -  No recurrence of fever- no leukocytosis- following closely   Depression -  Risperdal    COPD (chronic obstructive pulmonary disease)  Nicotine abuse - stable- cont nebs and Prednisone - cont Nicotine patch    Atrial fibrillation   - cont Amiodarone- d/c'd Heparin infusion and resumed Eliquis on 9/19    History of pulmonary embolism -cont Eliquis    Left-sided weakness  - suspect he laid on his arm while he was on the floor - he is quite tender - xray shoulder unrevealing- Ortho eval requested - Dr Lucia Gaskins recommending PT- axillary view xray of shoulder ordered and this is unrevealing-  - cont PRN Oxycodone - add K pad today  Left arm swelling - arm appears a little swollen today but venous duplex is negative for DVT - will elevate arm  GERD - cont PPI   DVT prophylaxis: Heparin infusion Code Status: Full code Family Communication:  Disposition Plan: likely will need SNF Consultants:   Neuro Procedures:   EEG> Normal electroencephalogram, awake and drowsy. There are no focal lateralizing or epileptiform features.  2 D ECHO  - Left ventricle: The cavity size was normal. Wall thickness was   normal. Systolic function was normal. The estimated ejection   fraction was in the range of 55% to 60%. Wall motion was normal;   there were no regional wall motion abnormalities. - Aortic valve: There was mild stenosis. Valve area (VTI): 1.44   cm^2. Valve area (Vmax): 1.44 cm^2. Valve area (Vmean): 1.49   cm^2. - Right ventricle: The cavity size was mildly dilated. - Pulmonary  arteries: Systolic pressure was moderately increased.   PA peak pressure: 51 mm Hg (S). Antimicrobials:  Anti-infectives (From admission, onward)   None       Objective: Vitals:   11/05/17 1957 11/06/17 0505 11/06/17 0838 11/06/17 0839  BP: (!) 130/99 132/74    Pulse: 70 72    Resp: 18 17    Temp: 98.6 F (37 C) 98 F (36.7 C)    TempSrc: Oral Oral    SpO2: 96% 95% 95% 95%  Weight:      Height:        Intake/Output Summary (Last 24 hours) at 11/06/2017 1156 Last  data filed at 11/06/2017 0805 Gross per 24 hour  Intake 540 ml  Output 1050 ml  Net -510 ml   Filed Weights   11/02/17 0730  Weight: 95.7 kg    Examination: General exam: Appears comfortable  HEENT: PERRLA, oral mucosa moist, no sclera icterus or thrush Respiratory system: clear to auscultation today - Respiratory effort normal. Cardiovascular system: S1 & S2 heard, RRR.   Gastrointestinal system: Abdomen soft, non-tender, nondistended. Normal bowel sound. No organomegaly Central nervous system: Alert and oriented. Coarse upper extremity tremor- No focal neurological deficits. Extremities: No cyanosis, clubbing or edema- tenderness in left shoulder, upper arm and elbow- persistent swelling in left forearm   Skin: No rashes or ulcers Psychiatry:  Mood & affect appropriate.   Data Reviewed: I have personally reviewed following labs and imaging studies  CBC: Recent Labs  Lab 10/31/17 1459 11/01/17 0837 11/02/17 0549 11/03/17 0536  WBC 13.6* 8.7 7.5 7.0  NEUTROABS 11.2* 6.3 4.2  --   HGB 14.3 12.7* 11.7* 11.7*  HCT 44.5 39.6 37.5* 36.7*  MCV 91.6 91.9 93.3 91.5  PLT 387 314 304 643   Basic Metabolic Panel: Recent Labs  Lab 10/31/17 1459 11/01/17 0837 11/02/17 0549  NA 147* 145 143  K 3.5 3.3* 3.9  CL 105 109 109  CO2 28 27 26   GLUCOSE 93 101* 92  BUN 13 16 15   CREATININE 0.90 0.91 0.82  CALCIUM 9.5 8.9 8.7*  MG  --  1.9 1.9  PHOS  --  3.9 3.4   GFR: Estimated Creatinine Clearance: 88.3 mL/min (by C-G formula based on SCr of 0.82 mg/dL). Liver Function Tests: Recent Labs  Lab 10/31/17 1459 11/01/17 0837 11/02/17 0549  AST 21 22 20   ALT 17 17 17   ALKPHOS 68 55 50  BILITOT 1.6* 1.2 0.8  PROT 7.0 5.8* 5.3*  ALBUMIN 3.3* 2.6* 2.3*   No results for input(s): LIPASE, AMYLASE in the last 168 hours. Recent Labs  Lab 11/01/17 2214  AMMONIA 43*   Coagulation Profile: No results for input(s): INR, PROTIME in the last 168 hours. Cardiac Enzymes: Recent  Labs  Lab 10/31/17 1459  CKTOTAL 447*   BNP (last 3 results) No results for input(s): PROBNP in the last 8760 hours. HbA1C: No results for input(s): HGBA1C in the last 72 hours. CBG: No results for input(s): GLUCAP in the last 168 hours. Lipid Profile: No results for input(s): CHOL, HDL, LDLCALC, TRIG, CHOLHDL, LDLDIRECT in the last 72 hours. Thyroid Function Tests: No results for input(s): TSH, T4TOTAL, FREET4, T3FREE, THYROIDAB in the last 72 hours. Anemia Panel: No results for input(s): VITAMINB12, FOLATE, FERRITIN, TIBC, IRON, RETICCTPCT in the last 72 hours. Urine analysis:    Component Value Date/Time   COLORURINE YELLOW 10/31/2017 [place] 10/31/2017 1524   LABSPEC 1.023 10/31/2017 1524   PHURINE 5.0  10/31/2017 1524   GLUCOSEU NEGATIVE 10/31/2017 1524   HGBUR SMALL (A) 10/31/2017 1524   HGBUR negative 12/11/2009 1356   BILIRUBINUR NEGATIVE 10/31/2017 1524   BILIRUBINUR MODERATE 06/05/2015 1407   KETONESUR 80 (A) 10/31/2017 1524   PROTEINUR 100 (A) 10/31/2017 1524   UROBILINOGEN 2.0 06/05/2015 1407   UROBILINOGEN 1.0 12/30/2014 1830   NITRITE NEGATIVE 10/31/2017 1524   LEUKOCYTESUR NEGATIVE 10/31/2017 1524   Sepsis Labs: @LABRCNTIP (procalcitonin:4,lacticidven:4) )No results found for this or any previous visit (from the past 240 hour(s)).       Radiology Studies: Dg Chest 2 View  Result Date: 10/31/2017 CLINICAL DATA:  Fall 2 days ago with persistent chest pain, initial encounter EXAM: CHEST - 2 VIEW COMPARISON:  10/14/2017 FINDINGS: Cardiac shadow is within normal limits. The lungs are well aerated bilaterally. Minimal right basilar atelectasis is seen. No pneumothorax is. Degenerative changes of the thoracic spine are seen. No definitive rib fractures are noted. IMPRESSION: Mild right basilar atelectasis. No pneumothorax or rib abnormality is seen. Electronically Signed   By: Inez Catalina M.D.   On: 10/31/2017 16:12   Dg Pelvis 1-2  Views  Result Date: 10/31/2017 CLINICAL DATA:  Recent fall 2 days ago with pelvic pain, initial encounter EXAM: PELVIS - 1 VIEW COMPARISON:  None. FINDINGS: Pelvic ring is intact. No acute fracture or dislocation is seen. No gross soft tissue abnormality is noted. IMPRESSION: No acute abnormality noted. Electronically Signed   By: Inez Catalina M.D.   On: 10/31/2017 16:14   Ct Head Wo Contrast  Result Date: 10/31/2017 CLINICAL DATA:  67 y/o M; fall to floor unable to move since Saturday. EXAM: CT HEAD WITHOUT CONTRAST CT CERVICAL SPINE WITHOUT CONTRAST TECHNIQUE: Multidetector CT imaging of the head and cervical spine was performed following the standard protocol without intravenous contrast. Multiplanar CT image reconstructions of the cervical spine were also generated. COMPARISON:  09/17/2017 CT head and cervical spine. FINDINGS: CT HEAD FINDINGS Brain: No evidence of acute infarction, hemorrhage, hydrocephalus, extra-axial collection or mass lesion/mass effect. Stable moderate chronic microvascular ischemic changes and volume loss of the brain. Stable chronic lacunar infarct within left paramedian pons. Vascular: Calcific atherosclerosis of carotid siphons. No hyperdense vessel. Skull: Normal. Negative for fracture or focal lesion. Sinuses/Orbits: Normal aeration of the visible paranasal sinuses and mastoid air cells. Left intra-ocular lens replacement. Other: Right-sided ossicular replacement prosthesis. CT CERVICAL SPINE FINDINGS Alignment: Normal. Skull base and vertebrae: No acute fracture. No primary bone lesion or focal pathologic process. Chronic T1 spinous process fracture. Soft tissues and spinal canal: No prevertebral fluid or swelling. No visible canal hematoma. Disc levels: Intervertebral disc space heights are maintained. Small endplate marginal osteophytes are present at the C5-6 level. Upper chest: Negative. Other: Mild calcific atherosclerosis of the carotid bifurcations. IMPRESSION: CT head:  1. No acute intracranial abnormality or calvarial fracture. 2. Stable chronic microvascular ischemic changes, volume loss of the brain, and small chronic left paramedian pontine infarct. CT cervical spine: No acute fracture or dislocation. Chronic T1 spinous process fracture. Electronically Signed   By: Kristine Garbe M.D.   On: 10/31/2017 16:24   Ct Cervical Spine Wo Contrast  Result Date: 10/31/2017 CLINICAL DATA:  67 y/o M; fall to floor unable to move since Saturday. EXAM: CT HEAD WITHOUT CONTRAST CT CERVICAL SPINE WITHOUT CONTRAST TECHNIQUE: Multidetector CT imaging of the head and cervical spine was performed following the standard protocol without intravenous contrast. Multiplanar CT image reconstructions of the cervical spine were also generated. COMPARISON:  09/17/2017 CT head and cervical spine. FINDINGS: CT HEAD FINDINGS Brain: No evidence of acute infarction, hemorrhage, hydrocephalus, extra-axial collection or mass lesion/mass effect. Stable moderate chronic microvascular ischemic changes and volume loss of the brain. Stable chronic lacunar infarct within left paramedian pons. Vascular: Calcific atherosclerosis of carotid siphons. No hyperdense vessel. Skull: Normal. Negative for fracture or focal lesion. Sinuses/Orbits: Normal aeration of the visible paranasal sinuses and mastoid air cells. Left intra-ocular lens replacement. Other: Right-sided ossicular replacement prosthesis. CT CERVICAL SPINE FINDINGS Alignment: Normal. Skull base and vertebrae: No acute fracture. No primary bone lesion or focal pathologic process. Chronic T1 spinous process fracture. Soft tissues and spinal canal: No prevertebral fluid or swelling. No visible canal hematoma. Disc levels: Intervertebral disc space heights are maintained. Small endplate marginal osteophytes are present at the C5-6 level. Upper chest: Negative. Other: Mild calcific atherosclerosis of the carotid bifurcations. IMPRESSION: CT head: 1. No  acute intracranial abnormality or calvarial fracture. 2. Stable chronic microvascular ischemic changes, volume loss of the brain, and small chronic left paramedian pontine infarct. CT cervical spine: No acute fracture or dislocation. Chronic T1 spinous process fracture. Electronically Signed   By: Kristine Garbe M.D.   On: 10/31/2017 16:24   Mr Brain Wo Contrast  Result Date: 11/01/2017 CLINICAL DATA:  [place] in the bathroom 3 days ago.  Possible stroke. EXAM: MRI HEAD WITHOUT CONTRAST TECHNIQUE: Multiplanar, multiecho pulse sequences of the brain and surrounding structures were obtained without intravenous contrast. COMPARISON:  CT studies 10/31/2017 FINDINGS: Brain: Diffusion imaging shows a 5 mm acute infarction affecting the cortical and subcortical brain at the deep insula/temporoparietal junction on the left. No swelling, hemorrhage or mass effect. No other acute infarction. There chronic small-vessel ischemic changes of the pons. There are a few old small vessel cerebellar infarctions. Cerebral hemispheres elsewhere show chronic small-vessel ischemic changes the white matter. No large vessel territory infarction. Mass lesion, hemorrhage, hydrocephalus or extra-axial collection. Vascular: Major vessels at the base of the brain show flow. Skull and upper cervical spine: Negative Sinuses/Orbits: Clear/normal Other: None IMPRESSION: 5 mm acute infarction affecting the cortical and subcortical brain at the deep insula/temporoparietal junction region on the left. No mass effect or hemorrhage. Atrophy and extensive chronic small-vessel ischemic changes elsewhere. Electronically Signed   By: Nelson Chimes M.D.   On: 11/01/2017 12:41   Dg Shoulder Left  Result Date: 10/31/2017 CLINICAL DATA:  Fall 2 days ago with left shoulder pain, initial encounter EXAM: LEFT SHOULDER - 2+ VIEW COMPARISON:  06/26/2014 FINDINGS: Degenerative changes of the acromioclavicular joint are noted. No acute fracture or  dislocation is seen. No soft tissue abnormality is noted. The underlying bony thorax is within normal limits. IMPRESSION: Mild degenerative change without acute abnormality. Electronically Signed   By: Inez Catalina M.D.   On: 10/31/2017 16:13      Scheduled Meds: . ALPRAZolam  0.5 mg Oral TID  . amiodarone  200 mg Oral Daily  . apixaban  5 mg Oral BID  . atorvastatin  40 mg Oral Daily  . chlorhexidine  15 mL Mouth Rinse BID  . divalproex  250 mg Oral Q8H  . guaiFENesin  1,200 mg Oral BID  . levETIRAcetam  500 mg Oral BID  . mouth rinse  15 mL Mouth Rinse q12n4p  . mometasone-formoterol  2 puff Inhalation BID  . multivitamin with minerals  1 tablet Oral Daily  . nicotine  21 mg Transdermal Daily  . nutrition supplement (JUVEN)  1 packet Oral BID BM  .  oxybutynin  5 mg Oral TID  . pantoprazole  40 mg Oral Q1200  . potassium chloride SA  20 mEq Oral QODAY  . predniSONE  5 mg Oral Q breakfast  . risperiDONE  1 mg Oral BID   Continuous Infusions:    LOS: 4 days    Time spent in minutes: 35    Debbe Odea, MD [facility] Pager: [email or web address] Password [facility] 11/06/2017, 11:56 AM

## 2017-11-07 ENCOUNTER — Inpatient Hospital Stay (HOSPITAL_COMMUNITY): Payer: Medicare Other

## 2017-11-07 DIAGNOSIS — J690 Pneumonitis due to inhalation of food and vomit: Secondary | ICD-10-CM

## 2017-11-07 DIAGNOSIS — I4891 Unspecified atrial fibrillation: Secondary | ICD-10-CM

## 2017-11-07 LAB — CBC
HCT: 39.4 % (ref 39.0–52.0)
Hemoglobin: 12.6 g/dL — ABNORMAL LOW (ref 13.0–17.0)
MCH: 29.4 pg (ref 26.0–34.0)
MCHC: 32 g/dL (ref 30.0–36.0)
MCV: 92.1 fL (ref 78.0–100.0)
PLATELETS: 292 10*3/uL (ref 150–400)
RBC: 4.28 MIL/uL (ref 4.22–5.81)
RDW: 19.2 % — AB (ref 11.5–15.5)
WBC: 9.8 10*3/uL (ref 4.0–10.5)

## 2017-11-07 LAB — LACTIC ACID, PLASMA
Lactic Acid, Venous: 1 mmol/L (ref 0.5–1.9)
Lactic Acid, Venous: 0.8 mmol/L (ref 0.5–1.9)

## 2017-11-07 LAB — BASIC METABOLIC PANEL
Anion gap: 10 (ref 5–15)
BUN: 16 mg/dL (ref 8–23)
CHLORIDE: 103 mmol/L (ref 98–111)
CO2: 30 mmol/L (ref 22–32)
Calcium: 9.2 mg/dL (ref 8.9–10.3)
Creatinine, Ser: 0.85 mg/dL (ref 0.61–1.24)
GFR calc Af Amer: >60 mL/min (ref >60)
GFR calc non Af Amer: >60 mL/min (ref >60)
GLUCOSE: 114 mg/dL — AB (ref 70–99)
Potassium: 4 mmol/L (ref 3.5–5.1)
SODIUM: 143 mmol/L (ref 135–145)

## 2017-11-07 LAB — PROCALCITONIN: Procalcitonin: <0.1 ng/mL

## 2017-11-07 LAB — MRSA PCR SCREENING: MRSA by PCR: NEGATIVE

## 2017-11-07 MED ORDER — AMIODARONE HCL 200 MG PO TABS
400.0000 mg | ORAL_TABLET | Freq: Every day | ORAL | Status: DC
  Administered 2017-11-08 – 2017-11-10 (×3): 400 mg via ORAL
  Filled 2017-11-07 (×3): qty 2

## 2017-11-07 MED ORDER — AMIODARONE IV BOLUS ONLY 150 MG/100ML
150.0000 mg | Freq: Once | INTRAVENOUS | Status: DC
  Filled 2017-11-07: qty 100

## 2017-11-07 MED ORDER — SODIUM CHLORIDE 0.9 % IV BOLUS
500.0000 mL | Freq: Once | INTRAVENOUS | Status: AC
  Administered 2017-11-07: 500 mL via INTRAVENOUS

## 2017-11-07 MED ORDER — SODIUM CHLORIDE 0.9 % IV SOLN
3.0000 g | Freq: Four times a day (QID) | INTRAVENOUS | Status: DC
  Administered 2017-11-07 – 2017-11-10 (×13): 3 g via INTRAVENOUS
  Filled 2017-11-07 (×14): qty 3

## 2017-11-07 NOTE — Progress Notes (Signed)
Patient transferred to stepdown with rapid response. Amiodarone drip not started at the bedside at this time due to drop in pressure. Rapid response to notify Md.

## 2017-11-07 NOTE — Progress Notes (Addendum)
PROGRESS NOTE    Patrick Holt   HUD:[id number]  DOB: 1950/06/05  DOA: 10/31/2017 PCP: Medicine, [facility]   Brief Narrative:  [name] with atrial fibrillation, diastolic dysfunction CHF, COPD, conversion disorder, asthma, right heart failure, chronic lower extremity stasis dermatitis who presented to the ER with generalized weakness, fall, and inability to ambulate. He was just discharged from rehab facility a couple of days before. He laid on the floor for 2 days.    Subjective: Pain in left arm is still a problem. He has no new complaints. RN has noted that he went into A-fib early this AM and then reverted back to NSR. When I went up the evaluate him, he was back in A-fib with HR in 130s and therefore I have transferred him to SDU. He has no other complaints today but his RN also noted him to have a very slow swallow today. I have noted that he has a low grade fever again today and was hypoxic. CXR and SLP eval ordered.    Assessment & Plan:   Principal Problem:   Difficulty in walking/ falls - PT eval pending >> will need SNF- awaiting bed  Active Problems: Fever, hypoxia - CXR shows new infiltrates bilaterally - pneumonia vs edema - due to his fever, I suspect this is a pneumonia- he was here early this month with b/l PE and HCAP and was discharged on Doxycycline - ? If he is aspirating- will obtain SLP eval as well - start Unasyn today- check MRSA PCR (previously negative)  A-fib with RVR - likely triggered by fever, hypoxia- following- has returned back to NSR at this point - cont Amiodarone and Eliquis  Sepsis> Fever, rapid heart rate, pneumonia  CVA - likely incidental finding and not related to left arm weakness - has h/o A-fib- cont Eliquis which he did miss for 2 days while he was on the floor - MRI-  5 mm acute infarction affecting the cortical and subcortical brain at the deep insula/temporoparietal junction region on the left. No  mass effect or hemorrhage. Atrophy and extensive chronic small-vessel ischemic changes elsewhere. - Neuro assisting with management - Carotid duplex >> Bilateral ICAs 1-39% stenosis. Bilateral vertebral arteries patent with antegrade flow - 2 D ECHO> no thrombus- see report below  Acute encephalopathy   Seizure disorder   - Note on 9/17> "difficult to arouse and eyes rolled back and fluttering" - ? whether he had a seizure- was loaded with Keppra 1.5 gm - STAT EEG unrevealing- see below - cont Keppra and Depakote-    Severe upper extremity tremors - needs to be fed all meals   Depression - Risperdal    COPD (chronic obstructive pulmonary disease)    Nicotine abuse - stable- cont nebs and Prednisone - cont Nicotine patch     History of recent b/l pulmonary embolism -cont Eliquis    Left-sided weakness, pain and swelling  - suspect he laid on his arm while he was on the floor - he is quite tender - xray shoulder unrevealing- Ortho eval requested - Dr Lucia Gaskins recommending PT- axillary view xray of shoulder ordered and this is unrevealing-  - cont PRN Oxycodone - add K pad  - elevate arm  GERD - cont PPI   DVT prophylaxis: Heparin infusion Code Status: Full code Family Communication:  Disposition Plan: likely will need SNF Consultants:   Neuro Procedures:   EEG> Normal electroencephalogram, awake and drowsy. There are no focal lateralizing  or epileptiform features.  2 D ECHO  - Left ventricle: The cavity size was normal. Wall thickness was   normal. Systolic function was normal. The estimated ejection   fraction was in the range of 55% to 60%. Wall motion was normal;   there were no regional wall motion abnormalities. - Aortic valve: There was mild stenosis. Valve area (VTI): 1.44   cm^2. Valve area (Vmax): 1.44 cm^2. Valve area (Vmean): 1.49   cm^2. - Right ventricle: The cavity size was mildly dilated. - Pulmonary arteries: Systolic pressure was moderately  increased.   PA peak pressure: 51 mm Hg (S). Antimicrobials:  Anti-infectives (From admission, onward)   None       Objective: Vitals:   11/07/17 0810 11/07/17 0904 11/07/17 0923 11/07/17 0925  BP:  97/71  (!) 86/64  Pulse:  (!) 119  99  Resp:      Temp:   (!) 100.8 F (38.2 C)   TempSrc:   Oral   SpO2: (!) 84%     Weight:      Height:        Intake/Output Summary (Last 24 hours) at 11/07/2017 1100 Last data filed at 11/07/2017 0500 Gross per 24 hour  Intake 240 ml  Output 1050 ml  Net -810 ml   Filed Weights   11/02/17 0730  Weight: 95.7 kg    Examination: General exam: Appears comfortable  HEENT: PERRLA, oral mucosa moist, no sclera icterus or thrush Respiratory system: mild b/l rhonchi - Respiratory effort normal. Cardiovascular system: S1 & S2 heard, IIRR- tachycardia   Gastrointestinal system: Abdomen soft, non-tender, nondistended. Normal bowel sound. No organomegaly Central nervous system: Alert and oriented. Coarse upper extremity tremor- No focal neurological deficits. Extremities: No cyanosis, clubbing or edema- tenderness in left shoulder, upper arm and elbow- persistent swelling in left forearm   Skin: No rashes or ulcers Psychiatry:  Mood & affect appropriate.   Data Reviewed: I have personally reviewed following labs and imaging studies  CBC: Recent Labs  Lab 10/31/17 1459 11/01/17 0837 11/02/17 0549 11/03/17 0536  WBC 13.6* 8.7 7.5 7.0  NEUTROABS 11.2* 6.3 4.2  --   HGB 14.3 12.7* 11.7* 11.7*  HCT 44.5 39.6 37.5* 36.7*  MCV 91.6 91.9 93.3 91.5  PLT 387 314 304 944   Basic Metabolic Panel: Recent Labs  Lab 10/31/17 1459 11/01/17 0837 11/02/17 0549 11/07/17 0940  NA 147* 145 143 143  K 3.5 3.3* 3.9 4.0  CL 105 109 109 103  CO2 28 27 26 30   GLUCOSE 93 101* 92 114*  BUN 13 16 15 16   CREATININE 0.90 0.91 0.82 0.85  CALCIUM 9.5 8.9 8.7* 9.2  MG  --  1.9 1.9  --   PHOS  --  3.9 3.4  --    GFR: Estimated Creatinine Clearance: 85.2  mL/min (by C-G formula based on SCr of 0.85 mg/dL). Liver Function Tests: Recent Labs  Lab 10/31/17 1459 11/01/17 0837 11/02/17 0549  AST 21 22 20   ALT 17 17 17   ALKPHOS 68 55 50  BILITOT 1.6* 1.2 0.8  PROT 7.0 5.8* 5.3*  ALBUMIN 3.3* 2.6* 2.3*   No results for input(s): LIPASE, AMYLASE in the last 168 hours. Recent Labs  Lab 11/01/17 2214  AMMONIA 43*   Coagulation Profile: No results for input(s): INR, PROTIME in the last 168 hours. Cardiac Enzymes: Recent Labs  Lab 10/31/17 1459  CKTOTAL 447*   BNP (last 3 results) No results for input(s): PROBNP in the  last 8760 hours. HbA1C: No results for input(s): HGBA1C in the last 72 hours. CBG: No results for input(s): GLUCAP in the last 168 hours. Lipid Profile: No results for input(s): CHOL, HDL, LDLCALC, TRIG, CHOLHDL, LDLDIRECT in the last 72 hours. Thyroid Function Tests: No results for input(s): TSH, T4TOTAL, FREET4, T3FREE, THYROIDAB in the last 72 hours. Anemia Panel: No results for input(s): VITAMINB12, FOLATE, FERRITIN, TIBC, IRON, RETICCTPCT in the last 72 hours. Urine analysis:    Component Value Date/Time   COLORURINE YELLOW 10/31/2017 [place] 10/31/2017 1524   LABSPEC 1.023 10/31/2017 1524   PHURINE 5.0 10/31/2017 1524   GLUCOSEU NEGATIVE 10/31/2017 1524   HGBUR SMALL (A) 10/31/2017 1524   HGBUR negative 12/11/2009 1356   BILIRUBINUR NEGATIVE 10/31/2017 1524   BILIRUBINUR MODERATE 06/05/2015 1407   KETONESUR 80 (A) 10/31/2017 1524   PROTEINUR 100 (A) 10/31/2017 1524   UROBILINOGEN 2.0 06/05/2015 1407   UROBILINOGEN 1.0 12/30/2014 1830   NITRITE NEGATIVE 10/31/2017 1524   LEUKOCYTESUR NEGATIVE 10/31/2017 1524   Sepsis Labs: @LABRCNTIP (procalcitonin:4,lacticidven:4) )No results found for this or any previous visit (from the past 240 hour(s)).       Radiology Studies: Dg Chest 2 View  Result Date: 10/31/2017 CLINICAL DATA:  Fall 2 days ago with persistent chest pain, initial  encounter EXAM: CHEST - 2 VIEW COMPARISON:  10/14/2017 FINDINGS: Cardiac shadow is within normal limits. The lungs are well aerated bilaterally. Minimal right basilar atelectasis is seen. No pneumothorax is. Degenerative changes of the thoracic spine are seen. No definitive rib fractures are noted. IMPRESSION: Mild right basilar atelectasis. No pneumothorax or rib abnormality is seen. Electronically Signed   By: Inez Catalina M.D.   On: 10/31/2017 16:12   Dg Pelvis 1-2 Views  Result Date: 10/31/2017 CLINICAL DATA:  Recent fall 2 days ago with pelvic pain, initial encounter EXAM: PELVIS - 1 VIEW COMPARISON:  None. FINDINGS: Pelvic ring is intact. No acute fracture or dislocation is seen. No gross soft tissue abnormality is noted. IMPRESSION: No acute abnormality noted. Electronically Signed   By: Inez Catalina M.D.   On: 10/31/2017 16:14   Ct Head Wo Contrast  Result Date: 10/31/2017 CLINICAL DATA:  67 y/o M; fall to floor unable to move since Saturday. EXAM: CT HEAD WITHOUT CONTRAST CT CERVICAL SPINE WITHOUT CONTRAST TECHNIQUE: Multidetector CT imaging of the head and cervical spine was performed following the standard protocol without intravenous contrast. Multiplanar CT image reconstructions of the cervical spine were also generated. COMPARISON:  09/17/2017 CT head and cervical spine. FINDINGS: CT HEAD FINDINGS Brain: No evidence of acute infarction, hemorrhage, hydrocephalus, extra-axial collection or mass lesion/mass effect. Stable moderate chronic microvascular ischemic changes and volume loss of the brain. Stable chronic lacunar infarct within left paramedian pons. Vascular: Calcific atherosclerosis of carotid siphons. No hyperdense vessel. Skull: Normal. Negative for fracture or focal lesion. Sinuses/Orbits: Normal aeration of the visible paranasal sinuses and mastoid air cells. Left intra-ocular lens replacement. Other: Right-sided ossicular replacement prosthesis. CT CERVICAL SPINE FINDINGS Alignment:  Normal. Skull base and vertebrae: No acute fracture. No primary bone lesion or focal pathologic process. Chronic T1 spinous process fracture. Soft tissues and spinal canal: No prevertebral fluid or swelling. No visible canal hematoma. Disc levels: Intervertebral disc space heights are maintained. Small endplate marginal osteophytes are present at the C5-6 level. Upper chest: Negative. Other: Mild calcific atherosclerosis of the carotid bifurcations. IMPRESSION: CT head: 1. No acute intracranial abnormality or calvarial fracture. 2. Stable chronic microvascular ischemic changes,  volume loss of the brain, and small chronic left paramedian pontine infarct. CT cervical spine: No acute fracture or dislocation. Chronic T1 spinous process fracture. Electronically Signed   By: Kristine Garbe M.D.   On: 10/31/2017 16:24   Ct Cervical Spine Wo Contrast  Result Date: 10/31/2017 CLINICAL DATA:  67 y/o M; fall to floor unable to move since Saturday. EXAM: CT HEAD WITHOUT CONTRAST CT CERVICAL SPINE WITHOUT CONTRAST TECHNIQUE: Multidetector CT imaging of the head and cervical spine was performed following the standard protocol without intravenous contrast. Multiplanar CT image reconstructions of the cervical spine were also generated. COMPARISON:  09/17/2017 CT head and cervical spine. FINDINGS: CT HEAD FINDINGS Brain: No evidence of acute infarction, hemorrhage, hydrocephalus, extra-axial collection or mass lesion/mass effect. Stable moderate chronic microvascular ischemic changes and volume loss of the brain. Stable chronic lacunar infarct within left paramedian pons. Vascular: Calcific atherosclerosis of carotid siphons. No hyperdense vessel. Skull: Normal. Negative for fracture or focal lesion. Sinuses/Orbits: Normal aeration of the visible paranasal sinuses and mastoid air cells. Left intra-ocular lens replacement. Other: Right-sided ossicular replacement prosthesis. CT CERVICAL SPINE FINDINGS Alignment: Normal.  Skull base and vertebrae: No acute fracture. No primary bone lesion or focal pathologic process. Chronic T1 spinous process fracture. Soft tissues and spinal canal: No prevertebral fluid or swelling. No visible canal hematoma. Disc levels: Intervertebral disc space heights are maintained. Small endplate marginal osteophytes are present at the C5-6 level. Upper chest: Negative. Other: Mild calcific atherosclerosis of the carotid bifurcations. IMPRESSION: CT head: 1. No acute intracranial abnormality or calvarial fracture. 2. Stable chronic microvascular ischemic changes, volume loss of the brain, and small chronic left paramedian pontine infarct. CT cervical spine: No acute fracture or dislocation. Chronic T1 spinous process fracture. Electronically Signed   By: Kristine Garbe M.D.   On: 10/31/2017 16:24   Mr Brain Wo Contrast  Result Date: 11/01/2017 CLINICAL DATA:  [place] in the bathroom 3 days ago.  Possible stroke. EXAM: MRI HEAD WITHOUT CONTRAST TECHNIQUE: Multiplanar, multiecho pulse sequences of the brain and surrounding structures were obtained without intravenous contrast. COMPARISON:  CT studies 10/31/2017 FINDINGS: Brain: Diffusion imaging shows a 5 mm acute infarction affecting the cortical and subcortical brain at the deep insula/temporoparietal junction on the left. No swelling, hemorrhage or mass effect. No other acute infarction. There chronic small-vessel ischemic changes of the pons. There are a few old small vessel cerebellar infarctions. Cerebral hemispheres elsewhere show chronic small-vessel ischemic changes the white matter. No large vessel territory infarction. Mass lesion, hemorrhage, hydrocephalus or extra-axial collection. Vascular: Major vessels at the base of the brain show flow. Skull and upper cervical spine: Negative Sinuses/Orbits: Clear/normal Other: None IMPRESSION: 5 mm acute infarction affecting the cortical and subcortical brain at the deep insula/temporoparietal  junction region on the left. No mass effect or hemorrhage. Atrophy and extensive chronic small-vessel ischemic changes elsewhere. Electronically Signed   By: Nelson Chimes M.D.   On: 11/01/2017 12:41   Dg Shoulder Left  Result Date: 10/31/2017 CLINICAL DATA:  Fall 2 days ago with left shoulder pain, initial encounter EXAM: LEFT SHOULDER - 2+ VIEW COMPARISON:  06/26/2014 FINDINGS: Degenerative changes of the acromioclavicular joint are noted. No acute fracture or dislocation is seen. No soft tissue abnormality is noted. The underlying bony thorax is within normal limits. IMPRESSION: Mild degenerative change without acute abnormality. Electronically Signed   By: Inez Catalina M.D.   On: 10/31/2017 16:13      Scheduled Meds: . ALPRAZolam  0.5 mg  Oral TID  . [START ON 11/08/2017] amiodarone  400 mg Oral Daily  . apixaban  5 mg Oral BID  . atorvastatin  40 mg Oral Daily  . chlorhexidine  15 mL Mouth Rinse BID  . divalproex  250 mg Oral Q8H  . guaiFENesin  1,200 mg Oral BID  . levETIRAcetam  500 mg Oral BID  . mouth rinse  15 mL Mouth Rinse q12n4p  . mometasone-formoterol  2 puff Inhalation BID  . multivitamin with minerals  1 tablet Oral Daily  . nicotine  21 mg Transdermal Daily  . nutrition supplement (JUVEN)  1 packet Oral BID BM  . oxybutynin  5 mg Oral TID  . pantoprazole  40 mg Oral Q1200  . polyethylene glycol  17 g Oral Daily  . potassium chloride SA  20 mEq Oral QODAY  . predniSONE  5 mg Oral Q breakfast  . risperiDONE  1 mg Oral BID   Continuous Infusions: . amiodarone       LOS: 5 days    Time spent in minutes: 35    Debbe Odea, MD [facility] Pager: [email or web address] Password [email or web address] 11/07/2017, 11:00 AM

## 2017-11-07 NOTE — Progress Notes (Signed)
   11/07/17 0904  MEWS Score  Pulse Rate (!) 119  BP 97/71  MEWS Score  MEWS RR 1  MEWS Pulse 2  MEWS Systolic 1  MEWS LOC 0  MEWS Temp 0  MEWS Score 4  MEWS Score Color Red  MEWS Assessment  Is this an acute change? Yes  MEWS guidelines implemented *See Row Information* Red  Rapid Response Notification  Name of Rapid Response RN Notified Zoe  Date Rapid Response Notified 11/07/17  Time Rapid Response Notified 0940  Provider Notification  Provider Name/Title Dr. Wynelle Cleveland  Date Provider Notified 11/07/17  Time Provider Notified [phone]  Notification Type Face-to-face  Notification Reason Change in status  Response See new orders (transfer to stepdown)  Date of Provider Response 11/07/17  Time of Provider Response [phone]

## 2017-11-07 NOTE — Progress Notes (Signed)
Pharmacy Antibiotic Note  Patrick Holt is a 67 y.o. male admitted on 10/31/2017 with generalized weakness, fall, and inability to ambulate.  Pharmacy has been consulted for Unasyn dosing for aspiration pneumonia.  Plan: Unasyn 3g IV q6h No dose further adjustments needed Pharmacy will sign-off, please re-consult if needed  Height: 5\' 3"  (160 cm) Weight: 211 lb (95.7 kg) IBW/kg (Calculated) : 56.9  Temp (24hrs), Avg:99.3 F (37.4 C), Min:97.9 F (36.6 C), Max:100.8 F (38.2 C)  Recent Labs  Lab 10/31/17 1459 10/31/17 1505 11/01/17 0837 11/02/17 0549 11/03/17 0536 11/07/17 0940  WBC 13.6*  --  8.7 7.5 7.0 9.8  CREATININE 0.90  --  0.91 0.82  --  0.85  LATICACIDVEN  --  1.05  --   --   --   --     Estimated Creatinine Clearance: 85.2 mL/min (by C-G formula based on SCr of 0.85 mg/dL).    Allergies  Allergen Reactions  . Immune Globulins Other (See Comments)    Acute renal failure  . Rho (D) Immune Globulin     Acute renal failure Acute renal failure  . Ciprofloxacin Swelling  . Flagyl [Metronidazole] Swelling  . Lisinopril Diarrhea  . Sulfa Antibiotics Other (See Comments)    blisters  . Sulfasalazine     blisters blisters  . Cefepime Other (See Comments)    Diffuse maculopapular rash when receiving both vanc and cefepime, timing unclear.  . Morphine And Related Other (See Comments)    Ineffective for pain management  . Requip [Ropinirole Hcl] Other (See Comments)    constipation  . Vancomycin     Diffuse maculopapular rash when receiving both vanc/cefepime.  Timing unclear.    . Betamethasone Dipropionate Other (See Comments)    Unknown  . Betamethasone Dipropionate     Unknown  . Bupropion     Unknown  . Bupropion Hcl Other (See Comments)    Unknown  . Clobetasol Other (See Comments)    Unknown Unknown  . Codeine Other (See Comments)    Does not help with pain  . Escitalopram Oxalate Other (See Comments)    Unknown  . Escitalopram Oxalate    Unknown  . Fluoxetine Hcl Other (See Comments)    Unknown  . Fluoxetine Hcl     Unknown  . Furosemide Other (See Comments)    Unknown Unknown  . Meclizine Rash  . Paroxetine Other (See Comments)    Unknown  . Penicillins Other (See Comments)    Unknown reaction Has patient had a PCN reaction causing immediate rash, facial/tongue/throat swelling, SOB or lightheadedness with hypotension: unknown Has patient had a PCN reaction causing severe rash involving mucus membranes or skin necrosis: unknown Has patient had a PCN reaction that required hospitalization unknown Has patient had a PCN reaction occurring within the last 10 years: unknown If all of the above answers are "NO", then may proceed with Cephalosporin use. Unknown Unknown reactio  . Tacrolimus Other (See Comments)    Unknown  . Tetanus Toxoid Other (See Comments)    Unknown  . Tuberculin Purified Protein Derivative Other (See Comments)    Unknown    Antimicrobials this admission: 9/23 Unasyn >>  Dose adjustments this admission: none  Microbiology results: 9/23 BCx: sent 9/23 MRSA PCR: sent  Thank you for allowing pharmacy to be a part of this patient's care.  Peggyann Juba, PharmD, BCPS Pager: [phone] 11/07/2017 11:24 AM

## 2017-11-07 NOTE — Evaluation (Signed)
SLP Cancellation Note  Patient Details Name: Patrick Holt MRN: [id number] DOB: 1951/02/11   Cancelled treatment:       Reason Eval/Treat Not Completed: Medical issues which prohibited therapy(pt with transfer to ICU, hypoxic, low grade fevers and pna vs edema on CXR, AMS, will continue efforts)   Macario Golds 11/07/2017, 12:11 PM  Luanna Salk, [place] [facility] S[facility] [place] Pager [phone] Office [phone]

## 2017-11-07 NOTE — Progress Notes (Signed)
Patient converted back to afib. MD paged. Received orders for patient to be transferred to stepdown. Patient is requiring amiodarone bolus. Vital signs trending down. Rapid nurse paged to start the drip at bedside.

## 2017-11-07 NOTE — Progress Notes (Signed)
PT Cancellation Note  Patient Details Name: Patrick Holt MRN: [id number] DOB: 11-27-1950   Cancelled Treatment:    Reason Eval/Treat Not Completed: Fatigue/lethargy limiting ability to participate;Patient declined, no reason specified - Pt very drowsy and difficult to arouse upon PT arrival to room. When asked if he wanted PT to come back later, pt states "yes", and when asked if he felt okay, pt states "I think, get out". Will check back as schedule allows.   Julien Girt, PT Acute [facility] Pager [phone]  Office [phone]    [place] 11/07/2017, 1:57 PM

## 2017-11-08 LAB — BASIC METABOLIC PANEL
Anion gap: 8 (ref 5–15)
BUN: 15 mg/dL (ref 8–23)
CO2: 32 mmol/L (ref 22–32)
Calcium: 8.8 mg/dL — ABNORMAL LOW (ref 8.9–10.3)
Chloride: 105 mmol/L (ref 98–111)
Creatinine, Ser: 0.69 mg/dL (ref 0.61–1.24)
GFR calc Af Amer: >60 mL/min (ref >60)
GFR calc non Af Amer: >60 mL/min (ref >60)
Glucose, Bld: 85 mg/dL (ref 70–99)
Potassium: 3.9 mmol/L (ref 3.5–5.1)
Sodium: 145 mmol/L (ref 135–145)

## 2017-11-08 LAB — CBC
HCT: 35.4 % — ABNORMAL LOW (ref 39.0–52.0)
Hemoglobin: 11.1 g/dL — ABNORMAL LOW (ref 13.0–17.0)
MCH: 29.4 pg (ref 26.0–34.0)
MCHC: 31.4 g/dL (ref 30.0–36.0)
MCV: 93.7 fL (ref 78.0–100.0)
Platelets: 253 10*3/uL (ref 150–400)
RBC: 3.78 MIL/uL — ABNORMAL LOW (ref 4.22–5.81)
RDW: 19 % — AB (ref 11.5–15.5)
WBC: 9.1 10*3/uL (ref 4.0–10.5)

## 2017-11-08 MED ORDER — LEVETIRACETAM IN NACL 500 MG/100ML IV SOLN
500.0000 mg | Freq: Once | INTRAVENOUS | Status: AC
  Administered 2017-11-08: 500 mg via INTRAVENOUS
  Filled 2017-11-08: qty 100

## 2017-11-08 MED ORDER — DIVALPROEX SODIUM 250 MG PO DR TAB: 250.0000 mg | DELAYED_RELEASE_TABLET | Freq: Three times a day (TID) | ORAL | Status: DC

## 2017-11-08 MED ORDER — HYDROCOD POLST-CPM POLST ER 10-8 MG/5ML PO SUER
5.0000 mL | Freq: Two times a day (BID) | ORAL | Status: DC
  Administered 2017-11-08 – 2017-11-10 (×4): 5 mL via ORAL
  Filled 2017-11-08 (×4): qty 5

## 2017-11-08 MED ORDER — LEVETIRACETAM 500 MG PO TABS
500.0000 mg | ORAL_TABLET | Freq: Two times a day (BID) | ORAL | Status: DC
  Administered 2017-11-09: 500 mg via ORAL
  Filled 2017-11-08: qty 1

## 2017-11-08 MED ORDER — BENZONATATE 100 MG PO CAPS
100.0000 mg | ORAL_CAPSULE | Freq: Three times a day (TID) | ORAL | Status: DC
  Administered 2017-11-08 – 2017-11-10 (×6): 100 mg via ORAL
  Filled 2017-11-08 (×6): qty 1

## 2017-11-08 MED ORDER — METOPROLOL TARTRATE 5 MG/5ML IV SOLN
2.5000 mg | INTRAVENOUS | Status: DC | PRN
  Administered 2017-11-08: 2.5 mg via INTRAVENOUS
  Filled 2017-11-08: qty 5

## 2017-11-08 MED ORDER — SODIUM CHLORIDE 0.9 % IV SOLN
INTRAVENOUS | Status: DC
  Administered 2017-11-08 (×2): via INTRAVENOUS

## 2017-11-08 MED ORDER — VALPROATE SODIUM 500 MG/5ML IV SOLN
250.0000 mg | Freq: Once | INTRAVENOUS | Status: AC
  Administered 2017-11-08: 250 mg via INTRAVENOUS
  Filled 2017-11-08: qty 2.5

## 2017-11-08 NOTE — Evaluation (Signed)
Clinical/Bedside Swallow Evaluation Patient Details  Name: Patrick Holt MRN: [id number] Date of Birth: Jul 17, 1950  Today's Date: 11/08/2017 Time: SLP Start Time (ACUTE ONLY): 1115 SLP Stop Time (ACUTE ONLY): 1155 SLP Time Calculation (min) (ACUTE ONLY): 40 min  Past Medical History:  Past Medical History:  Diagnosis Date  . Anemia   . Anxiety   . Asthma   . Atrial fibrillation ([place]) 01/2012   with RVR  . Bipolar 1 disorder ([place])   . Cancer ([place])   . Collagen vascular disease ([place])   . Conversion disorder 06/2010  . COPD (chronic obstructive pulmonary disease) ([place])   . Dermatomyositis ([place])   . Dermatomyositis ([place])   . Diverticulitis   . Diverticulosis of colon   . Dysrhythmia    "irregular" (11/15/2012)  . Esophageal dysmotility   . Esophageal stricture   . Fibromyalgia   . Gastritis   . GERD (gastroesophageal reflux disease)   . Hand fracture, right    sept 13, 2017, still wearing splint as og 01-10-2017  . Headache(784.0)    "severe; get them often" (11/15/2012)  . Hiatal hernia   . History of narcotic addiction ([place])   . Hx of adenomatous colonic polyps   . Hyperlipidemia   . Hypertension   . Internal hemorrhoids   . Ischemic heart disease   . Major depression    with acute psychotic break in 06/2010  . Myocardial infarct ([place])    mulitple (1999, 2000, 2004)  . Narcotic dependence ([place])   . Nephrolithiasis   . Obesity   . OCD (obsessive compulsive disorder)   . Otosclerosis   . Paroxysmal A-fib ([place])   . Peripheral neuropathy   . Raynaud's disease   . Renal insufficiency   . Rheumatoid arthritis(714.0)   . Sarcoidosis   . Seizures ([place])   . Subarachnoid hemorrhage ([place]) 01/2012   with subdural  hematoma.   . Type II diabetes mellitus ([place])   . Urge incontinence   . Vertigo    Past Surgical History:  Past Surgical History:  Procedure Laterality Date  . BACK SURGERY    . CARDIAC CATHETERIZATION    . CARPAL TUNNEL RELEASE Bilateral   . CATARACT  EXTRACTION W/ INTRAOCULAR LENS IMPLANT Left   . COLONOSCOPY N/A 09/27/2012   Procedure: COLONOSCOPY;  Surgeon: Lafayette Dragon, MD;  Location: [facility];  Service: Endoscopy;  Laterality: N/A;  . ESOPHAGOGASTRODUODENOSCOPY N/A 09/27/2012   Procedure: ESOPHAGOGASTRODUODENOSCOPY (EGD);  Surgeon: Lafayette Dragon, MD;  Location: [facility];  Service: Endoscopy;  Laterality: N/A;  . KNEE ARTHROSCOPY W/ MENISCAL REPAIR Left 2009  . LUMBAR DISC SURGERY    . LYMPH NODE DISSECTION Right 1970's   "neck; dr thought I had Hodgkins; turned out to be sarcoidosis" (11/15/2012)  . squamous papilloma   2010   removed by Dr. Constance Holster ENT, noted on tongue  . TONSILLECTOMY     HPI:  Pt is a 67 y.o. M with significant PMH of multiple comorbidities including atrial fibrillation, diastolic dysfunction CHF, COPD, conversion disorder, asthma, right heart failure, chronic lower extremity stasis dermatitis. Previous hospitalization for PE, now admited s/p fall with prolonged down time, encephalitis. 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    Assessment / Plan / Recommendation Clinical Impression  Pt currently presents with clinical indications/concerns for aspiration of even secretions.  Pt with wet voice, obvious vagal nerve, facial nerve and hypoglossal nerve impairments.  Provided pt with single ice chip - delayed swallow, wet cough and  voice noted immediately post=swallow.  Also noted before po intake.  Pt with whitish coating on lateral tongue right - concern for possible oral candidiasis.  Of note, caregiver reports pt with congested cough since Saturday and pt admits to dysphagia/coughing with intake since admit.  Suspect CVA impacting pt's swallow/airway protection.  Rec strict NPO and MBS when pt is fully alert and able to participate.  Encouraged pt/caregiver to have pt cough/expectorate if able to help clear secretions.  Education with teach back completed.   SLP Visit Diagnosis:  Dysphagia, oropharyngeal phase (R13.12)    Aspiration Risk  Severe aspiration risk;Risk for inadequate nutrition/hydration    Diet Recommendation NPO        Other  Recommendations   MBS when alert!   Follow up Recommendations Skilled Nursing facility      Frequency and Duration min 2x/week          Prognosis Prognosis for Safe Diet Advancement: Guarded Barriers to Reach Goals: Severity of deficits;Behavior      Swallow Study   General Date of Onset: 11/08/17 HPI: Pt is a 67 y.o. M with significant PMH of multiple comorbidities including atrial fibrillation, diastolic dysfunction CHF, COPD, conversion disorder, asthma, right heart failure, chronic lower extremity stasis dermatitis. Previous hospitalization for PE, now admited s/p fall with prolonged down time, encephalitis. 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  Type of Study: Bedside Swallow Evaluation Diet Prior to this Study: NPO Temperature Spikes Noted: No Respiratory Status: Nasal cannula History of Recent Intubation: No Behavior/Cognition: Alert;Cooperative;Pleasant mood Oral Care Completed by SLP: (? oral candidiasis ) Oral Cavity - Dentition: Edentulous Vision: Functional for self-feeding Self-Feeding Abilities: Total assist Patient Positioning: Upright in bed Baseline Vocal Quality: Suspected CN X (Vagus) involvement;Low vocal intensity;Wet Volitional Cough: Weak;Congested Volitional Swallow: Unable to elicit    Oral/Motor/Sensory Function Overall Oral Motor/Sensory Function: Moderate impairment Facial ROM: Reduced left Facial Symmetry: Abnormal symmetry left Facial Strength: Reduced left Facial Sensation: Reduced left Lingual ROM: Reduced left Velum: Suspected CN X (Vagus) dysfunction;Impaired left Mandible: (dnt)   Ice Chips Ice chips: Impaired Presentation: Spoon Oral Phase Functional Implications: Prolonged oral transit Pharyngeal Phase Impairments: Cough -  Immediate;Cough - Delayed;Suspected delayed Swallow   Thin Liquid Thin Liquid: Not tested    Nectar Thick Nectar Thick Liquid: Not tested   Honey Thick Honey Thick Liquid: Not tested   Puree Puree: Not tested   Solid     Solid: Not tested      Macario Golds 11/08/2017,12:02 PM  Luanna Salk, [facility] SLP Acute Rehab Services Pager [phone] Office [phone]

## 2017-11-08 NOTE — Evaluation (Signed)
SLP Cancellation Note  Patient Details Name: Patrick Holt MRN: [id number] DOB: January 14, 1951   Cancelled treatment:       Reason Eval/Treat Not Completed: Other (comment)(pt lethargic, reports he did not sleep well, congested cough, will continue efforts)   Macario Golds 11/08/2017, 9:21 AM   Luanna Salk, [place] SLP [place] Pager [phone] Office [phone]

## 2017-11-08 NOTE — Progress Notes (Signed)
Patient went back to A-fib. 12 lead EKG completed to confirm. Rizwan paged, received new order for PRN lopressor.

## 2017-11-08 NOTE — Progress Notes (Signed)
Physical Therapy Treatment Patient Details Name: Patrick Holt MRN: [id number] DOB: 1950-04-18 Today's Date: 11/08/2017    History of Present Illness Pt is a 67 y.o. M with significant PMH of multiple comorbidities including atrial fibrillation, diastolic dysfunction CHF, COPD, conversion disorder, asthma, right hear failure, chronic lower extremity stasis dermatitis. Previous hospitalization for PE, now admited s/p fall with prolonged down time. Imaging showed small infarct L parietal/temporal junction.    PT Comments    Pt is lethargic today, kept eyes closed most of PT session but did briefly verbalize when asked questions. He was oriented to self, location, month and could follow 1 step commands. +2 total assist for supine to sit, pt sat edge of bed x 6 minutes with min to min/guard assist for balance. Performed BLE AAROM.  HR 120s at rest. 90-110's after RN gave lopressor at beginning of PT session.    Follow Up Recommendations  SNF;Supervision/Assistance - 24 hour;Supervision for mobility/OOB;Other (comment)(versus ALF that can provide total A)     Equipment Recommendations  Other (comment)(TBA)    Recommendations for Other Services       Precautions / Restrictions Precautions Precautions: Fall Precaution Comments: multiple falls Restrictions Weight Bearing Restrictions: No    Mobility  Bed Mobility Overal bed mobility: Needs Assistance Bed Mobility: Supine to Sit;Sit to Supine     Supine to sit: Total assist;+2 for physical assistance Sit to supine: Total assist;+2 for physical assistance   General bed mobility comments: +2 to advance BLEs and to raise trunk; pt sat at edge of bed x 6 minutes  Transfers                 General transfer comment: deferred due to pain, poor activity tolerance, decreased balance  Ambulation/Gait                 Stairs             Wheelchair Mobility    Modified Rankin (Stroke Patients Only) Modified  Rankin (Stroke Patients Only) Pre-Morbid Rankin Score: Moderately severe disability Modified Rankin: Severe disability     Balance Overall balance assessment: Needs assistance Sitting-balance support: Feet supported;Single extremity supported Sitting balance-Leahy Scale: Poor Sitting balance - Comments: pt sat EOB x 6 minutes, min A initially for posterior lean then min/guard, worked on posture (lifting head and centering head, keeps head laterally flexed to R,) Postural control: Posterior lean                                  Cognition Arousal/Alertness: Lethargic Behavior During Therapy: WFL for tasks assessed/performed Overall Cognitive Status: No family/caregiver present to determine baseline cognitive functioning                                 General Comments: oriented to self, location, month, can follow 1 step commands, but kept eyes closed 99% of PT session, partially aroused to verbal stimulii      Exercises  hip abduction supine x 10 B AAROM Heel slides supine x 10 B AAROM    General Comments        Pertinent Vitals/Pain Pain Assessment: No/denies pain Faces Pain Scale: Hurts even more Pain Location: LUE with movement Pain Descriptors / Indicators: Aching;Grimacing Pain Intervention(s): Limited activity within patient's tolerance;Monitored during session;Repositioned    Home Living  Prior Function            PT Goals (current goals can now be found in the care plan section) Acute Rehab PT Goals Patient Stated Goal: none stated PT Goal Formulation: With patient Time For Goal Achievement: 11/17/17 Potential to Achieve Goals: Fair Progress towards PT goals: Progressing toward goals    Frequency    Min 2X/week      PT Plan Current plan remains appropriate    Co-evaluation              AM-PAC PT "6 Clicks" Daily Activity  Outcome Measure  Difficulty turning over in bed (including  adjusting bedclothes, sheets and blankets)?: Unable Difficulty moving from lying on back to sitting on the side of the bed? : Unable Difficulty sitting down on and standing up from a chair with arms (e.g., wheelchair, bedside commode, etc,.)?: Unable Help needed moving to and from a bed to chair (including a wheelchair)?: Total Help needed walking in hospital room?: Total Help needed climbing 3-5 steps with a railing? : Total 6 Click Score: 6    End of Session   Activity Tolerance: Patient limited by fatigue;Patient limited by pain Patient left: in bed;with call bell/phone within reach;with bed alarm set Nurse Communication: Mobility status PT Visit Diagnosis: Other abnormalities of gait and mobility (R26.89);Muscle weakness (generalized) (M62.81)     Time: 2620-3559 PT Time Calculation (min) (ACUTE ONLY): 17 min  Charges:  $Therapeutic Activity: 8-22 mins                     Blondell Reveal Kistler PT 11/08/2017  Acute Rehabilitation Services Pager [phone] Office [phone]

## 2017-11-08 NOTE — Progress Notes (Signed)
PROGRESS NOTE    Patrick Holt   Patrick Holt:[id number]  DOB: 09/02/1950  DOA: 10/31/2017 PCP: Medicine, [facility]   Brief Narrative:  Patrick Holt with atrial fibrillation, diastolic dysfunction CHF, COPD, conversion disorder, asthma, right heart failure, chronic lower extremity stasis dermatitis who presented to the ER with generalized weakness, fall, and inability to ambulate. He was just discharged from rehab facility a couple of days before. He laid on the floor for 2 days. He was noted to have left arm weakness and pain and underwent imaging of his brain. Noted to have a CVA which likely was not causing his left arm findings. Also suspected to have had (possibly) a seizure.  He stabilized quickly and we were awaiting a SNF bed when he developed a low grade fever, paroxysmal episodes of A-fib with RVR and new lung infiltrates with hypoxia. He was moved to SDU and began on treatment for pneumonia.    Subjective: He states he is coughing quite a lot. He is tired as he was not able to sleep last night.     Assessment & Plan:   Principal Problem:   Difficulty in walking/ falls - this is the original reason for admission- he fell 2 days after leaving rehab and layed on the floor for 2 days - PT eval completed >> will need SNF- awaiting bed  Active Problems: 9/23> Fever, hypoxia-> pneumonia, likely aspiration - CXR shows new infiltrates bilaterally > pneumonia vs edema - due to his fever, I suspect this is a pneumonia- he was here early this month with b/l PE and HCAP and was discharged on Doxycycline - ? If he is aspirating- will obtain SLP eval as well -  started Unasyn  -  MRSA PCR neg - fevers improved- is coughing and a little congested -start Tussionex and Tessalon - SLP notes vagal, facial and hypoglossal nerve impairments on exam and feels he is aspirating- recommending NPO and MBS in the future when more alert - start IVF while NPO  A-fib with RVR - likely  triggered by fever, hypoxia-- has returned back to NSR and has maintained NSR since after IVF bolus - cont Amiodarone and Eliquis   Left-sided weakness, pain and swelling  - suspect he laid on his arm while he was on the floor and current injury is due to the trauma - he is quite tender and swollen  - xray shoulder unrevealing- Ortho eval requested - Dr Lucia Gaskins recommending PT- axillary view xray of shoulder ordered and this is unrevealing-  - cont PRN Oxycodone - add K pad  - elevate arm (not being done properly)  Sepsis> Fever, rapid heart rate, pneumonia  CVA - MRI-  5 mm acute infarction affecting the cortical and subcortical brain at the deep insula/temporoparietal junction region on the left. No mass effect or hemorrhage. Atrophy and extensive chronic small-vessel ischemic changes elsewhere. - Neuro assisting with management- neuro feels this is an incidental finding and not related to left arm weakness - has h/o A-fib- cont Eliquis which he did miss for 2 days while he was on the floor - Carotid duplex >> Bilateral ICAs 1-39% stenosis. Bilateral vertebral arteries patent with antegrade flow - 2 D ECHO> no thrombus- see report below  Acute encephalopathy   Seizure disorder   - Note on 9/17> "difficult to arouse and eyes rolled back and fluttering" - ? whether he had a seizure- was loaded with Keppra 1.5 gm - STAT EEG unrevealing- see below -  cont Keppra and Depakote-    Severe upper extremity tremors - needs to be fed all meals   Depression - Risperdal    COPD (chronic obstructive pulmonary disease)    Nicotine abuse - stable- cont nebs and Prednisone - cont Nicotine patch     History of recent b/l pulmonary embolism -cont Eliquis  GERD - cont PPI   DVT prophylaxis: Heparin infusion Code Status: Full code Family Communication:  Disposition Plan:  will need SNF Consultants:   Neuro Procedures:   EEG> Normal electroencephalogram, awake and drowsy. There are no  focal lateralizing or epileptiform features.  2 D ECHO  - Left ventricle: The cavity size was normal. Wall thickness was   normal. Systolic function was normal. The estimated ejection   fraction was in the range of 55% to 60%. Wall motion was normal;   there were no regional wall motion abnormalities. - Aortic valve: There was mild stenosis. Valve area (VTI): 1.44   cm^2. Valve area (Vmax): 1.44 cm^2. Valve area (Vmean): 1.49   cm^2. - Right ventricle: The cavity size was mildly dilated. - Pulmonary arteries: Systolic pressure was moderately increased.   PA peak pressure: 51 mm Hg (S). Antimicrobials:  Anti-infectives (From admission, onward)   Start     Dose/Rate Route Frequency Ordered Stop   11/07/17 1200  Ampicillin-Sulbactam (UNASYN) 3 g in sodium chloride 0.9 % 100 mL IVPB     3 g 200 mL/hr over 30 Minutes Intravenous Every 6 hours 11/07/17 1130         Objective: Vitals:   11/08/17 0700 11/08/17 0704 11/08/17 0732 11/08/17 1203  BP: (!) 105/43     Pulse: 72 70    Resp: 16 16    Temp:   98.3 F (36.8 C) 99.1 F (37.3 C)  TempSrc:   Oral Oral  SpO2: 92% 93%    Weight:      Height:        Intake/Output Summary (Last 24 hours) at 11/08/2017 1313 Last data filed at 11/08/2017 1055 Gross per 24 hour  Intake 890 ml  Output 1550 ml  Net -660 ml   Filed Weights   11/02/17 0730  Weight: 95.7 kg    Examination: General exam: Appears comfortable - weak, tired appearing HEENT: PERRLA, oral mucosa moist, no sclera icterus or thrush Respiratory system: mild b/l rhonchi - coughing - Respiratory effort normal. On 2 L [place] Cardiovascular system: S1 & S2 heard, RRR  Gastrointestinal system: Abdomen soft, non-tender, nondistended. Normal bowel sound. No organomegaly Central nervous system: Alert and oriented. Coarse upper extremity tremor- No focal neurological deficits. Extremities: No cyanosis, clubbing or edema- tenderness in left shoulder, upper arm and elbow- persistent  swelling in left forearm   Skin: No rashes or ulcers Psychiatry:  Mood & affect appropriate.   Data Reviewed: I have personally reviewed following labs and imaging studies  CBC: Recent Labs  Lab 11/02/17 0549 11/03/17 0536 11/07/17 0940 11/08/17 0305  WBC 7.5 7.0 9.8 9.1  NEUTROABS 4.2  --   --   --   HGB 11.7* 11.7* 12.6* 11.1*  HCT 37.5* 36.7* 39.4 35.4*  MCV 93.3 91.5 92.1 93.7  PLT 304 269 292 941   Basic Metabolic Panel: Recent Labs  Lab 11/02/17 0549 11/07/17 0940 11/08/17 0305  NA 143 143 145  K 3.9 4.0 3.9  CL 109 103 105  CO2 26 30 32  GLUCOSE 92 114* 85  BUN 15 16 15   CREATININE 0.82 0.85 0.69  CALCIUM 8.7* 9.2 8.8*  MG 1.9  --   --   PHOS 3.4  --   --    GFR: Estimated Creatinine Clearance: 90.5 mL/min (by C-G formula based on SCr of 0.69 mg/dL). Liver Function Tests: Recent Labs  Lab 11/02/17 0549  AST 20  ALT 17  ALKPHOS 50  BILITOT 0.8  PROT 5.3*  ALBUMIN 2.3*   No results for input(s): LIPASE, AMYLASE in the last 168 hours. Recent Labs  Lab 11/01/17 2214  AMMONIA 43*   Coagulation Profile: No results for input(s): INR, PROTIME in the last 168 hours. Cardiac Enzymes: No results for input(s): CKTOTAL, CKMB, CKMBINDEX, TROPONINI in the last 168 hours. BNP (last 3 results) No results for input(s): PROBNP in the last 8760 hours. HbA1C: No results for input(s): HGBA1C in the last 72 hours. CBG: No results for input(s): GLUCAP in the last 168 hours. Lipid Profile: No results for input(s): CHOL, HDL, LDLCALC, TRIG, CHOLHDL, LDLDIRECT in the last 72 hours. Thyroid Function Tests: No results for input(s): TSH, T4TOTAL, FREET4, T3FREE, THYROIDAB in the last 72 hours. Anemia Panel: No results for input(s): VITAMINB12, FOLATE, FERRITIN, TIBC, IRON, RETICCTPCT in the last 72 hours. Urine analysis:    Component Value Date/Time   COLORURINE YELLOW 10/31/2017 [place] 10/31/2017 1524   LABSPEC 1.023 10/31/2017 1524   PHURINE  5.0 10/31/2017 1524   GLUCOSEU NEGATIVE 10/31/2017 1524   HGBUR SMALL (A) 10/31/2017 1524   HGBUR negative 12/11/2009 1356   BILIRUBINUR NEGATIVE 10/31/2017 1524   BILIRUBINUR MODERATE 06/05/2015 1407   KETONESUR 80 (A) 10/31/2017 1524   PROTEINUR 100 (A) 10/31/2017 1524   UROBILINOGEN 2.0 06/05/2015 1407   UROBILINOGEN 1.0 12/30/2014 1830   NITRITE NEGATIVE 10/31/2017 1524   LEUKOCYTESUR NEGATIVE 10/31/2017 1524   Sepsis Labs: @LABRCNTIP (procalcitonin:4,lacticidven:4) ) Recent Results (from the past 240 hour(s))  MRSA PCR Screening     Status: None   Collection Time: 11/07/17 10:28 AM  Result Value Ref Range Status   MRSA by PCR NEGATIVE NEGATIVE Final    Comment:        The GeneXpert MRSA Assay (FDA approved for NASAL specimens only), is one component of a comprehensive MRSA colonization surveillance program. It is not intended to diagnose MRSA infection nor to guide or monitor treatment for MRSA infections. Performed at [facility], [place] [address] [place], [place] [address]   Culture, blood (x 2)     Status: None (Preliminary result)   Collection Time: 11/07/17 12:07 PM  Result Value Ref Range Status   Specimen Description   Final    BLOOD RIGHT HAND Performed at [place] [address] [place], [place] [address]    Special Requests   Final    BOTTLES DRAWN AEROBIC AND ANAEROBIC Blood Culture adequate volume Performed at [facility] [address] [place], [place] [address]    Culture   Final    NO GROWTH < 24 HOURS Performed at [facility] [address], [place], [place] [address]    Report Status PENDING  Incomplete  Culture, blood (x 2)     Status: None (Preliminary result)   Collection Time: 11/07/17 12:09 PM  Result Value Ref Range Status   Specimen Description   Final    BLOOD LEFT HAND Performed at [facility] [address] [place], [place] [address]     Special Requests   Final    BOTTLES DRAWN  AEROBIC AND ANAEROBIC Blood Culture adequate volume Performed at [facility] [address], [place], [place] [address]    Culture   Final    NO GROWTH < 24 HOURS Performed at [facility] [address] [place],  [address]    Report Status PENDING  Incomplete         Radiology Studies: Dg Chest 2 View  Result Date: 10/31/2017 CLINICAL DATA:  Fall 2 days ago with persistent chest pain, initial encounter EXAM: CHEST - 2 VIEW COMPARISON:  10/14/2017 FINDINGS: Cardiac shadow is within normal limits. The lungs are well aerated bilaterally. Minimal right basilar atelectasis is seen. No pneumothorax is. Degenerative changes of the thoracic spine are seen. No definitive rib fractures are noted. IMPRESSION: Mild right basilar atelectasis. No pneumothorax or rib abnormality is seen. Electronically Signed   By: Inez Catalina M.D.   On: 10/31/2017 16:12   Dg Pelvis 1-2 Views  Result Date: 10/31/2017 CLINICAL DATA:  Recent fall 2 days ago with pelvic pain, initial encounter EXAM: PELVIS - 1 VIEW COMPARISON:  None. FINDINGS: Pelvic ring is intact. No acute fracture or dislocation is seen. No gross soft tissue abnormality is noted. IMPRESSION: No acute abnormality noted. Electronically Signed   By: Inez Catalina M.D.   On: 10/31/2017 16:14   Ct Head Wo Contrast  Result Date: 10/31/2017 CLINICAL DATA:  67 y/o M; fall to floor unable to move since Saturday. EXAM: CT HEAD WITHOUT CONTRAST CT CERVICAL SPINE WITHOUT CONTRAST TECHNIQUE: Multidetector CT imaging of the head and cervical spine was performed following the standard protocol without intravenous contrast. Multiplanar CT image reconstructions of the cervical spine were also generated. COMPARISON:  09/17/2017 CT head and cervical spine. FINDINGS: CT HEAD FINDINGS Brain: No evidence of acute infarction, hemorrhage, hydrocephalus, extra-axial collection or mass lesion/mass  effect. Stable moderate chronic microvascular ischemic changes and volume loss of the brain. Stable chronic lacunar infarct within left paramedian pons. Vascular: Calcific atherosclerosis of carotid siphons. No hyperdense vessel. Skull: Normal. Negative for fracture or focal lesion. Sinuses/Orbits: Normal aeration of the visible paranasal sinuses and mastoid air cells. Left intra-ocular lens replacement. Other: Right-sided ossicular replacement prosthesis. CT CERVICAL SPINE FINDINGS Alignment: Normal. Skull base and vertebrae: No acute fracture. No primary bone lesion or focal pathologic process. Chronic T1 spinous process fracture. Soft tissues and spinal canal: No prevertebral fluid or swelling. No visible canal hematoma. Disc levels: Intervertebral disc space heights are maintained. Small endplate marginal osteophytes are present at the C5-6 level. Upper chest: Negative. Other: Mild calcific atherosclerosis of the carotid bifurcations. IMPRESSION: CT head: 1. No acute intracranial abnormality or calvarial fracture. 2. Stable chronic microvascular ischemic changes, volume loss of the brain, and small chronic left paramedian pontine infarct. CT cervical spine: No acute fracture or dislocation. Chronic T1 spinous process fracture. Electronically Signed   By: Kristine Garbe M.D.   On: 10/31/2017 16:24   Ct Cervical Spine Wo Contrast  Result Date: 10/31/2017 CLINICAL DATA:  68 y/o M; fall to floor unable to move since Saturday. EXAM: CT HEAD WITHOUT CONTRAST CT CERVICAL SPINE WITHOUT CONTRAST TECHNIQUE: Multidetector CT imaging of the head and cervical spine was performed following the standard protocol without intravenous contrast. Multiplanar CT image reconstructions of the cervical spine were also generated. COMPARISON:  09/17/2017 CT head and cervical spine. FINDINGS: CT HEAD FINDINGS Brain: No evidence of acute infarction, hemorrhage, hydrocephalus, extra-axial collection or mass lesion/mass effect.  Stable moderate chronic microvascular ischemic changes and volume  loss of the brain. Stable chronic lacunar infarct within left paramedian pons. Vascular: Calcific atherosclerosis of carotid siphons. No hyperdense vessel. Skull: Normal. Negative for fracture or focal lesion. Sinuses/Orbits: Normal aeration of the visible paranasal sinuses and mastoid air cells. Left intra-ocular lens replacement. Other: Right-sided ossicular replacement prosthesis. CT CERVICAL SPINE FINDINGS Alignment: Normal. Skull base and vertebrae: No acute fracture. No primary bone lesion or focal pathologic process. Chronic T1 spinous process fracture. Soft tissues and spinal canal: No prevertebral fluid or swelling. No visible canal hematoma. Disc levels: Intervertebral disc space heights are maintained. Small endplate marginal osteophytes are present at the C5-6 level. Upper chest: Negative. Other: Mild calcific atherosclerosis of the carotid bifurcations. IMPRESSION: CT head: 1. No acute intracranial abnormality or calvarial fracture. 2. Stable chronic microvascular ischemic changes, volume loss of the brain, and small chronic left paramedian pontine infarct. CT cervical spine: No acute fracture or dislocation. Chronic T1 spinous process fracture. Electronically Signed   By: Kristine Garbe M.D.   On: 10/31/2017 16:24   Mr Brain Wo Contrast  Result Date: 11/01/2017 CLINICAL DATA:  [place] in the bathroom 3 days ago.  Possible stroke. EXAM: MRI HEAD WITHOUT CONTRAST TECHNIQUE: Multiplanar, multiecho pulse sequences of the brain and surrounding structures were obtained without intravenous contrast. COMPARISON:  CT studies 10/31/2017 FINDINGS: Brain: Diffusion imaging shows a 5 mm acute infarction affecting the cortical and subcortical brain at the deep insula/temporoparietal junction on the left. No swelling, hemorrhage or mass effect. No other acute infarction. There chronic small-vessel ischemic changes of the pons. There are a  few old small vessel cerebellar infarctions. Cerebral hemispheres elsewhere show chronic small-vessel ischemic changes the white matter. No large vessel territory infarction. Mass lesion, hemorrhage, hydrocephalus or extra-axial collection. Vascular: Major vessels at the base of the brain show flow. Skull and upper cervical spine: Negative Sinuses/Orbits: Clear/normal Other: None IMPRESSION: 5 mm acute infarction affecting the cortical and subcortical brain at the deep insula/temporoparietal junction region on the left. No mass effect or hemorrhage. Atrophy and extensive chronic small-vessel ischemic changes elsewhere. Electronically Signed   By: Nelson Chimes M.D.   On: 11/01/2017 12:41   Dg Shoulder Left  Result Date: 10/31/2017 CLINICAL DATA:  Fall 2 days ago with left shoulder pain, initial encounter EXAM: LEFT SHOULDER - 2+ VIEW COMPARISON:  06/26/2014 FINDINGS: Degenerative changes of the acromioclavicular joint are noted. No acute fracture or dislocation is seen. No soft tissue abnormality is noted. The underlying bony thorax is within normal limits. IMPRESSION: Mild degenerative change without acute abnormality. Electronically Signed   By: Inez Catalina M.D.   On: 10/31/2017 16:13      Scheduled Meds: . ALPRAZolam  0.5 mg Oral TID  . amiodarone  400 mg Oral Daily  . apixaban  5 mg Oral BID  . atorvastatin  40 mg Oral Daily  . benzonatate  100 mg Oral TID  . chlorhexidine  15 mL Mouth Rinse BID  . chlorpheniramine-HYDROcodone  5 mL Oral Q12H  . divalproex  250 mg Oral Q8H  . guaiFENesin  1,200 mg Oral BID  . levETIRAcetam  500 mg Oral BID  . mouth rinse  15 mL Mouth Rinse q12n4p  . mometasone-formoterol  2 puff Inhalation BID  . multivitamin with minerals  1 tablet Oral Daily  . nicotine  21 mg Transdermal Daily  . nutrition supplement (JUVEN)  1 packet Oral BID BM  . oxybutynin  5 mg Oral TID  . pantoprazole  40 mg Oral Q1200  .  polyethylene glycol  17 g Oral Daily  . potassium  chloride SA  20 mEq Oral QODAY  . predniSONE  5 mg Oral Q breakfast  . risperiDONE  1 mg Oral BID   Continuous Infusions: . ampicillin-sulbactam (UNASYN) IV Stopped (11/08/17 0704)     LOS: 6 days    Time spent in minutes: 35    Debbe Odea, MD [facility] Pager: [email or web address] Password [email or web address] 11/08/2017, 1:13 PM

## 2017-11-09 ENCOUNTER — Encounter (HOSPITAL_COMMUNITY): Payer: Self-pay

## 2017-11-09 ENCOUNTER — Inpatient Hospital Stay (HOSPITAL_COMMUNITY): Payer: Medicare Other

## 2017-11-09 LAB — CBC WITH DIFFERENTIAL/PLATELET
BASOS ABS: 0 10*3/uL (ref 0.0–0.1)
Basophils Relative: 0 %
EOS PCT: 1 %
Eosinophils Absolute: 0.1 10*3/uL (ref 0.0–0.7)
HEMATOCRIT: 34.6 % — AB (ref 39.0–52.0)
Hemoglobin: 10.8 g/dL — ABNORMAL LOW (ref 13.0–17.0)
LYMPHS PCT: 26 %
Lymphs Abs: 1.7 10*3/uL (ref 0.7–4.0)
MCH: 29.5 pg (ref 26.0–34.0)
MCHC: 31.2 g/dL (ref 30.0–36.0)
MCV: 94.5 fL (ref 78.0–100.0)
Monocytes Absolute: 0.7 10*3/uL (ref 0.1–1.0)
Monocytes Relative: 11 %
NEUTROS ABS: 4 10*3/uL (ref 1.7–7.7)
Neutrophils Relative %: 62 %
PLATELETS: 208 10*3/uL (ref 150–400)
RBC: 3.66 MIL/uL — ABNORMAL LOW (ref 4.22–5.81)
RDW: 18.5 % — ABNORMAL HIGH (ref 11.5–15.5)
WBC: 6.5 10*3/uL (ref 4.0–10.5)

## 2017-11-09 LAB — COMPREHENSIVE METABOLIC PANEL
ALT: 21 U/L (ref 0–44)
ANION GAP: 7 (ref 5–15)
AST: 13 U/L — AB (ref 15–41)
Albumin: 2.1 g/dL — ABNORMAL LOW (ref 3.5–5.0)
Alkaline Phosphatase: 55 U/L (ref 38–126)
BUN: 16 mg/dL (ref 8–23)
CO2: 32 mmol/L (ref 22–32)
CREATININE: 0.67 mg/dL (ref 0.61–1.24)
Calcium: 8.5 mg/dL — ABNORMAL LOW (ref 8.9–10.3)
Chloride: 106 mmol/L (ref 98–111)
GFR calc Af Amer: >60 mL/min (ref >60)
Glucose, Bld: 79 mg/dL (ref 70–99)
Potassium: 3.7 mmol/L (ref 3.5–5.1)
Sodium: 145 mmol/L (ref 135–145)
TOTAL PROTEIN: 5.2 g/dL — AB (ref 6.5–8.1)
Total Bilirubin: 0.7 mg/dL (ref 0.3–1.2)

## 2017-11-09 LAB — MAGNESIUM: MAGNESIUM: 2.1 mg/dL (ref 1.7–2.4)

## 2017-11-09 LAB — PHOSPHORUS: PHOSPHORUS: 3.2 mg/dL (ref 2.5–4.6)

## 2017-11-09 MED ORDER — FLUCONAZOLE 100MG IVPB
100.0000 mg | INTRAVENOUS | Status: DC
  Administered 2017-11-10: 100 mg via INTRAVENOUS
  Filled 2017-11-09: qty 50

## 2017-11-09 MED ORDER — NYSTATIN 100000 UNIT/ML MT SUSP
5.0000 mL | Freq: Four times a day (QID) | OROMUCOSAL | Status: DC
  Administered 2017-11-09 – 2017-11-10 (×6): 500000 [IU] via ORAL
  Filled 2017-11-09 (×5): qty 5

## 2017-11-09 MED ORDER — FLUCONAZOLE IN SODIUM CHLORIDE 200-0.9 MG/100ML-% IV SOLN
200.0000 mg | Freq: Once | INTRAVENOUS | Status: AC
  Administered 2017-11-09: 200 mg via INTRAVENOUS
  Filled 2017-11-09: qty 100

## 2017-11-09 MED ORDER — ADULT MULTIVITAMIN LIQUID CH
15.0000 mL | Freq: Every day | ORAL | Status: DC
  Filled 2017-11-09: qty 15

## 2017-11-09 MED ORDER — RESOURCE THICKENUP CLEAR PO POWD
ORAL | Status: DC | PRN
  Filled 2017-11-09: qty 125

## 2017-11-09 MED ORDER — LIDOCAINE 5 % EX PTCH
1.0000 | MEDICATED_PATCH | CUTANEOUS | Status: DC
  Administered 2017-11-09 – 2017-11-10 (×2): 1 via TRANSDERMAL
  Filled 2017-11-09 (×2): qty 1

## 2017-11-09 MED ORDER — ADULT MULTIVITAMIN W/MINERALS CH
1.0000 | ORAL_TABLET | Freq: Every day | ORAL | Status: DC
  Administered 2017-11-10: 1 via ORAL
  Filled 2017-11-09: qty 1

## 2017-11-09 MED ORDER — VALPROIC ACID 250 MG/5ML PO SOLN
250.0000 mg | Freq: Three times a day (TID) | ORAL | Status: DC
  Administered 2017-11-09 – 2017-11-10 (×4): 250 mg via ORAL
  Filled 2017-11-09 (×6): qty 5

## 2017-11-09 MED ORDER — LEVETIRACETAM 100 MG/ML PO SOLN
500.0000 mg | Freq: Two times a day (BID) | ORAL | Status: DC
  Administered 2017-11-09 – 2017-11-10 (×2): 500 mg via ORAL
  Filled 2017-11-09 (×5): qty 5

## 2017-11-09 NOTE — Progress Notes (Signed)
Nutrition Follow-up  DOCUMENTATION CODES:   Obesity unspecified  INTERVENTION:  - Continue Juven BID. - Will order Magic Cup BID with meals, each supplement provides 290 kcal and 9 grams of protein. - Continue to encourage PO intakes.  - Will order for patient to be weighed today.  - Tech/RN to feed patient   NUTRITION DIAGNOSIS:   Increased nutrient needs related to wound healing as evidenced by estimated needs. -ongoing  GOAL:   Patient will meet greater than or equal to 90% of their needs -likely met on average.  MONITOR:   PO intake, Supplement acceptance, Labs, Weight trends, I & O's, Skin  ASSESSMENT:   67 y.o. male with atrial fibrillation, diastolic dysfunction CHF, COPD, conversion disorder, asthma, right heart failure, chronic lower extremity stasis dermatitis who presented to the ER with generalized weakness, fall, and inability to ambulate. He was just discharged from rehab facility a couple of days before. He laid on the floor for 2 days.   No new weight since admission. Patient previously on Heart Healthy diet, thin liquids and eating 50-100% of meals and taking Juven ~50% of the time provided. SLP saw patient for MBS this AM and diet now changed per her recommendation: Dysphagia 2, nectar-thick liquids. Patient was NPO since yesterday afternoon and has not received a meal tray since diet change late this AM.   Per Dr. Reggy Eye note yesterday afternoon: difficulty walking and falls, PNA likely d/t aspiration, L-sided weakness with pain and swelling thought to be trauma 2/2 fall and laying on that side/that arm, sepsis, previous CVA which is not felt to be the cause of L-sided weakness, severe BUE tremors with patient needing to be fed, stable COPD.    Medications reviewed; 15 mL liquid multivitamin/day, 5 mL Mycostatin QID, 1 packet Miralax/day, 5 mg Detasone/day, 20 mEq oral KCl/day. Labs reviewed; Ca: 8.5 mg/dL. IVF; NS @ 75 mL/hr.    Diet Order:   Diet Order             DIET DYS 2 Room service appropriate? Yes; Fluid consistency: Nectar Thick  Diet effective now        Diet - low sodium heart healthy              EDUCATION NEEDS:   Not appropriate for education at this time  Skin:  Skin Assessment: Skin Integrity Issues: Skin Integrity Issues:: Stage II Stage II: bilateral buttocks   Last BM:  9/21  Height:   Ht Readings from Last 1 Encounters:  11/01/17 5' 3" (1.6 m)    Weight:   Wt Readings from Last 1 Encounters:  11/02/17 95.7 kg    Ideal Body Weight:  56.4 kg  BMI:  Body mass index is 37.38 kg/m.  Estimated Nutritional Needs:   Kcal:  1500-1700  Protein:  75-85g  Fluid:  1.7L/day      Jarome Matin, MS, RD, LDN, CNSC Inpatient Clinical Dietitian Pager # [phone] After hours/weekend pager # [phone]

## 2017-11-09 NOTE — Progress Notes (Signed)
PROGRESS NOTE    [name]  STM:[id number] DOB: 1950-09-23 DOA: 10/31/2017 PCP: Medicine, [facility]    Brief Narrative:  Patrick Holt with atrial fibrillation, diastolic dysfunction CHF, COPD, conversion disorder, asthma, right heart failure, chronic lower extremity stasis dermatitis who presented to the ER with generalized weakness, fall, and inability to ambulate. He was just discharged from rehab facility a couple of days before. He laid on the floor for 2 days. He was noted to have left arm weakness and pain and underwent imaging of his brain. Noted to have a CVA which likely was not causing his left arm findings. Also suspected to have had (possibly) a seizure.  He stabilized quickly and we were awaiting a SNF bed when he developed a low grade fever, paroxysmal episodes of A-fib with RVR and new lung infiltrates with hypoxia. He was moved to SDU and began on treatment for pneumonia.    Assessment & Plan:   Principal Problem:   Acute metabolic encephalopathy Active Problems:   DEPRESSION, MAJOR   COPD (chronic obstructive pulmonary disease) (HCC)   Atrial fibrillation (HCC)   Difficulty in walking, not elsewhere classified   Seizure disorder (Patrick Holt)   History of pulmonary embolism   Left-sided weakness   Fall   Atrial fibrillation with RVR (HCC)   Aspiration pneumonia of both lungs due to gastric secretions (HCC)  Difficulty in walking/ falls - this is the original reason for admission- he fell 2 days after leaving rehab and layed on the floor for 2 days - PT eval completed >> will need SNF- awaiting bed  9/23> Fever, hypoxia-> pneumonia, likely aspiration - CXR shows new infiltrates bilaterally > pneumonia vs edema - Due to fever it was suspected this is a pneumonia- he was here early this month with b/l PE and HCAP and was discharged on Doxycycline - ? If he is aspirating- will obtain SLP eval as well; Recommending Dysphagia 2 Diet with Nectar Thick  Liquids -Started Unasyn  -  MRSA PCR neg - fevers improved- is coughing and a little congested -start Tussionex and Tessalon - SLP notes vagal, facial and hypoglossal nerve impairments on exam and feels he is aspirating- recommending NPO yesterday and MBS done today recommending Dysphagia 2 Diet with Nectar Thick Liquids -Started  IVF while NPO but now on a diet so will cut IVF to 50 mL/hr and likely stop tomorrow.  -O2 requirement is weaning patient is on 1 L this morning -Wean off of supplemental oxygen via nasal cannula if possible  A-fib with RVR now in NSR -Likely triggered by fever, hypoxia-- has returned back to NSR and has maintained NSR since after IVF bolus -Continue Amiodarone 400 mg po Daily and Eliquis 5 mg po BID  Left-sided weakness, pain and swelling  -Suspect he laid on his arm while he was on the floor and current injury is due to the trauma -He is quite tender and swollen -Xray shoulder unrevealing- Ortho eval requested - Dr Lucia Gaskins recommending PT- axillary view xray of shoulder ordered and this is unrevealing -Upper extremity venous duplex done was negative for DVT -Continue PRN Oxycodone -Added K pad  -Elevate arm (not being done properly) -Lidocaine Patch   Sepsis> Fever, rapid heart rate, pneumonia -As above -Sepsis Physiology improved -Now Afebrile and has no Leukocytosis  CVA - MRI-  5 mm acute infarction affecting the cortical and subcortical brain at the deep insula/temporoparietal junction region on the left. No mass effect or hemorrhage. Atrophy  and extensive chronic small-vessel ischemic changes elsewhere. -Neuro assisting with management- neuro feels this is an incidental finding and not related to left arm weakness -Has h/o A-fib- cont Eliquis which he did miss for 2 days while he was on the floor -Carotid duplex >> Bilateral ICAs 1-39% stenosis. Bilateral vertebral arteries patent with antegrade flow -2 D ECHO> no thrombus- see report below -PT/OT  recommending SNF -SLP recommending Dysphagia 2 Diet with Nectar Thick Liquids   Acute encephalopathy, improved Seizure disorder   - Note on 9/17> "difficult to arouse and eyes rolled back and fluttering" - ? whether he had a seizure- was loaded with Keppra 1.5 gm - STAT EEG unrevealing- see below -Cont Keppra and Depakote as on MAR    Severe upper extremity tremors -Needs to be fed all meals -? Parkinsonism  -Will need to follow up with Neurology at D/C   Depression -C/w Respiridone 1 mg po BID    COPD (chronic obstructive pulmonary disease)   Nicotine Abuse -Stable -Continue Dulera 2 puff IH BID, Duoneb 3 mL q6hprn Wheezing, and Prednisone 5 mg po Daily  -Continue with Nicotine Patch 21 mg TD Daily   History of Recent Bilateral Pulmonary Embolism -Continue Anticoagulation with Apixaban  GERD -Continue with PPI with Pantoprazole 40 mg p.o. daily  Oral Candidiasis -Started the patient on IV Diflucan with pharmacy to dose -Also started Nystatin swish and spit 4 times a day   DVT prophylaxis: Patient is anticoagulated with apixaban Code Status: FULL CODE Family Communication: No family present at bedside  Disposition Plan: Stable to Transfer to Telemetry; Anticipate discharge to SNF in next 24-48 hours  Consultants:   Neurology  Speech Therapy    Procedures:  EEG -Normal electroencephalogram, awakeand drowsy. There are no focal lateralizing or epileptiform features  ECHOCARDIOGRAM ------------------------------------------------------------------- Study Conclusions  - Left ventricle: The cavity size was normal. Wall thickness was   normal. Systolic function was normal. The estimated ejection   fraction was in the range of 55% to 60%. Wall motion was normal;   there were no regional wall motion abnormalities. - Aortic valve: There was mild stenosis. Valve area (VTI): 1.44   cm^2. Valve area (Vmax): 1.44 cm^2. Valve area (Vmean): 1.49   cm^2. - Right  ventricle: The cavity size was mildly dilated. - Pulmonary arteries: Systolic pressure was moderately increased.   PA peak pressure: 51 mm Hg (S).   Antimicrobials:  Anti-infectives (From admission, onward)   Start     Dose/Rate Route Frequency Ordered Stop   11/10/17 1000  fluconazole (DIFLUCAN) IVPB 100 mg     100 mg 50 mL/hr over 60 Minutes Intravenous Every 24 hours 11/09/17 1106     11/09/17 1200  fluconazole (DIFLUCAN) IVPB 200 mg     200 mg 100 mL/hr over 60 Minutes Intravenous  Once 11/09/17 1104 11/09/17 1338   11/07/17 1200  Ampicillin-Sulbactam (UNASYN) 3 g in sodium chloride 0.9 % 100 mL IVPB     3 g 200 mL/hr over 30 Minutes Intravenous Every 6 hours 11/07/17 1130       Subjective: Seen and examined at bedside and was more alert.  Was wanting that something to drink.  Denies chest pain, lightheadedness or dizziness.  States he feels very weak and fatigued.  No other concerns or complaints at this time  Objective: Vitals:   11/09/17 1200 11/09/17 1300 11/09/17 1410 11/09/17 1622  BP:  (!) 120/55  101/61  Pulse:  (!) 59 61 61  Resp:  11  12 16  Temp: 98.1 F (36.7 C)   98 F (36.7 C)  TempSrc: Oral   Oral  SpO2:  91% 92% 91%  Weight:    95.9 kg  Height:    5\' 3"  (1.6 m)    Intake/Output Summary (Last 24 hours) at 11/09/2017 1740 Last data filed at 11/09/2017 1704 Gross per 24 hour  Intake 1382.64 ml  Output 650 ml  Net 732.64 ml   Filed Weights   11/02/17 0730 11/09/17 1622  Weight: 95.7 kg 95.9 kg   Examination: Physical Exam:  Constitutional: WN/WD obese Caucasian male in NAD and appears calm and comfortable Eyes: PERRL, lids and conjunctivae normal, sclerae anicteric  ENMT: External Ears, Nose appear normal. Grossly normal hearing. Mucous membranes are moist.   Neck: Appears normal, supple, no cervical masses, normal ROM, no appreciable thyromegaly, no JVD Respiratory: Diminished to auscultation bilaterally with coarse breath sounds, no wheezing,  rales, rhonchi or crackles. Normal respiratory effort and patient is not tachypenic. No accessory muscle use. Wearing 1 Liter of Supplemental O2 via [place] Cardiovascular: RRR, no murmurs / rubs / gallops. S1 and S2 auscultated. 2+ Upper Left extremity edema.  Abdomen: Soft, non-tender, Slightly distended. No masses palpated. No appreciable hepatosplenomegaly. Bowel sounds positive x4.  GU: Deferred. Musculoskeletal: No clubbing / cyanosis of digits/nails. No joint deformity upper and lower extremities.  Skin: Left arm extremely swollen No induration; Warm and dry.  Neurologic: CN 2-12 grossly intact with no focal deficits. Has very pronounced bilateral tremors Psychiatric: Normal judgment and insight. Alert and oriented x 3. Normal mood and appropriate affect.   Data Reviewed: I have personally reviewed following labs and imaging studies  CBC: Recent Labs  Lab 11/03/17 0536 11/07/17 0940 11/08/17 0305 11/09/17 0806  WBC 7.0 9.8 9.1 6.5  NEUTROABS  --   --   --  4.0  HGB 11.7* 12.6* 11.1* 10.8*  HCT 36.7* 39.4 35.4* 34.6*  MCV 91.5 92.1 93.7 94.5  PLT 269 292 253 170   Basic Metabolic Panel: Recent Labs  Lab 11/07/17 0940 11/08/17 0305 11/09/17 0806  NA 143 145 145  K 4.0 3.9 3.7  CL 103 105 106  CO2 30 32 32  GLUCOSE 114* 85 79  BUN 16 15 16   CREATININE 0.85 0.69 0.67  CALCIUM 9.2 8.8* 8.5*  MG  --   --  2.1  PHOS  --   --  3.2   GFR: Estimated Creatinine Clearance: 90.6 mL/min (by C-G formula based on SCr of 0.67 mg/dL). Liver Function Tests: Recent Labs  Lab 11/09/17 0806  AST 13*  ALT 21  ALKPHOS 55  BILITOT 0.7  PROT 5.2*  ALBUMIN 2.1*   No results for input(s): LIPASE, AMYLASE in the last 168 hours. No results for input(s): AMMONIA in the last 168 hours. Coagulation Profile: No results for input(s): INR, PROTIME in the last 168 hours. Cardiac Enzymes: No results for input(s): CKTOTAL, CKMB, CKMBINDEX, TROPONINI in the last 168 hours. BNP (last 3  results) No results for input(s): PROBNP in the last 8760 hours. HbA1C: No results for input(s): HGBA1C in the last 72 hours. CBG: No results for input(s): GLUCAP in the last 168 hours. Lipid Profile: No results for input(s): CHOL, HDL, LDLCALC, TRIG, CHOLHDL, LDLDIRECT in the last 72 hours. Thyroid Function Tests: No results for input(s): TSH, T4TOTAL, FREET4, T3FREE, THYROIDAB in the last 72 hours. Anemia Panel: No results for input(s): VITAMINB12, FOLATE, FERRITIN, TIBC, IRON, RETICCTPCT in the last 72 hours. Sepsis  Labs: Recent Labs  Lab 11/07/17 1130 11/07/17 1405  PROCALCITON <0.10  --   LATICACIDVEN 1.0 0.8    Recent Results (from the past 240 hour(s))  MRSA PCR Screening     Status: None   Collection Time: 11/07/17 10:28 AM  Result Value Ref Range Status   MRSA by PCR NEGATIVE NEGATIVE Final    Comment:        The GeneXpert MRSA Assay (FDA approved for NASAL specimens only), is one component of a comprehensive MRSA colonization surveillance program. It is not intended to diagnose MRSA infection nor to guide or monitor treatment for MRSA infections. Performed at [facility], [place] [address] [place], [place] [address]   Culture, blood (x 2)     Status: None (Preliminary result)   Collection Time: 11/07/17 12:07 PM  Result Value Ref Range Status   Specimen Description   Final    BLOOD RIGHT HAND Performed at [facility] [address], [place], [place] [address]    Special Requests   Final    BOTTLES DRAWN AEROBIC AND ANAEROBIC Blood Culture adequate volume Performed at [facility] [address] [place], [place] [address]    Culture   Final    NO GROWTH 2 DAYS Performed at [facility] [address] [place], [place] [address]    Report Status PENDING  Incomplete  Culture, blood (x 2)     Status: None (Preliminary result)   Collection Time: 11/07/17 12:09 PM  Result Value Ref Range  Status   Specimen Description   Final    BLOOD LEFT HAND Performed at [facility] [address], [place], [place] [address]    Special Requests   Final    BOTTLES DRAWN AEROBIC AND ANAEROBIC Blood Culture adequate volume Performed at [facility] [address], [place], [place] [address]    Culture   Final    NO GROWTH 2 DAYS Performed at [facility] [address], [place], [place] [address]    Report Status PENDING  Incomplete    Radiology Studies: Dg Swallowing Func-speech Pathology  Result Date: 11/09/2017 Objective Swallowing Evaluation: Type of Study: MBS-Modified Barium Swallow Study  Patient Details Name: HAVOC SANLUIS MRN: [id number] Date of Birth: [birth year] Today's Date: 11/09/2017 Time: SLP Start Time (ACUTE ONLY): 4765 -SLP Stop Time (ACUTE ONLY): 4650 SLP Time Calculation (min) (ACUTE ONLY): 23 min Past Medical History: Past Medical History: Diagnosis Date . Anemia  . Anxiety  . Asthma  . Atrial fibrillation ([place]) 01/2012  with RVR . Bipolar 1 disorder ([place])  . Cancer ([place])  . Collagen vascular disease ([place])  . Conversion disorder 06/2010 . COPD (chronic obstructive pulmonary disease) ([place])  . Dermatomyositis ([place])  . Dermatomyositis ([place])  . Diverticulitis  . Diverticulosis of colon  . Dysrhythmia   "irregular" (11/15/2012) . Esophageal dysmotility  . Esophageal stricture  . Fibromyalgia  . Gastritis  . GERD (gastroesophageal reflux disease)  . Hand fracture, right   sept 13, 2017, still wearing splint as og 01-10-2017 . Headache(784.0)   "severe; get them often" (11/15/2012) . Hiatal hernia  . History of narcotic addiction ([place])  . Hx of adenomatous colonic polyps  . Hyperlipidemia  . Hypertension  . Internal hemorrhoids  . Ischemic heart disease  . Major depression   with acute psychotic break in 06/2010 . Myocardial infarct ([place])   mulitple (1999, 2000, 2004) . Narcotic dependence ([place])  .  Nephrolithiasis  . Obesity  . OCD (obsessive  compulsive disorder)  . Otosclerosis  . Paroxysmal A-fib ([place])  . Peripheral neuropathy  . Raynaud's disease  . Renal insufficiency  . Rheumatoid arthritis(714.0)  . Sarcoidosis  . Seizures ([place])  . Subarachnoid hemorrhage ([place]) 01/2012  with subdural  hematoma.  . Type II diabetes mellitus ([place])  . Urge incontinence  . Vertigo  Past Surgical History: Past Surgical History: Procedure Laterality Date . BACK SURGERY   . CARDIAC CATHETERIZATION   . CARPAL TUNNEL RELEASE Bilateral  . CATARACT EXTRACTION W/ INTRAOCULAR LENS IMPLANT Left  . COLONOSCOPY N/A 09/27/2012  Procedure: COLONOSCOPY;  Surgeon: Lafayette Dragon, MD;  Location: [facility] ENDOSCOPY;  Service: Endoscopy;  Laterality: N/A; . ESOPHAGOGASTRODUODENOSCOPY N/A 09/27/2012  Procedure: ESOPHAGOGASTRODUODENOSCOPY (EGD);  Surgeon: Lafayette Dragon, MD;  Location: [facility];  Service: Endoscopy;  Laterality: N/A; . KNEE ARTHROSCOPY W/ MENISCAL REPAIR Left 2009 . LUMBAR DISC SURGERY   . LYMPH NODE DISSECTION Right 1970's  "neck; dr thought I had Hodgkins; turned out to be sarcoidosis" (11/15/2012) . squamous papilloma   2010  removed by Dr. Constance Holster ENT, noted on tongue . TONSILLECTOMY   HPI: Pt is a 67 y.o. M with significant PMH of multiple comorbidities including atrial fibrillation, diastolic dysfunction CHF, COPD, conversion disorder, asthma, right heart failure, chronic lower extremity stasis dermatitis. Previous hospitalization for PE, now admited s/p fall with prolonged down time, encephalitis. 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 Pt was transferred to ICU due to respiratory deficits and concern for aspiration.  BSE completed with recommendation for MBS to allow instrumental swallow evaluation.   Subjective: pt awake in flouro chair Assessment / Plan / Recommendation CHL IP CLINICAL IMPRESSIONS 11/09/2017 Clinical Impression Patient presents with mild oral and moderate pharyngeal dysphagia with sensorimotor deficits.  He did not aspirate  despite sequential swallows of thin and nectar.  Consistent laryngeal penetration of thin noted (x1 to vocal folds) that did not clear with cued throat clearing/cough.  Penetration occured due to decreased laryngeal closure, shallow pyriform sinuses allowing dumping into larynx before swallow.  Tongue base retraction impaired allowing residuals mostly in vallecular region with solids = liquids aid clearance.  Pt did cough and expectorate large bolus of viscous secretions during MBS with verbal cueing.  Secretion retention noted in pharynx which likely contributes to pt's dysphagia.  Given pt positioning is an issue, would recommend conservative diet of dys2/nectar = allowing tsps thin and ice chips with strict precautions. Will follow up for dysphagia management, treatment.  Hopeful for improvement with pt becoming stronger.  Would recommend consider palliative consult given recurrent admissions to the hospital in the last six months and current medical status.  Thanks for allowing me to help with this pt's care plan.   SLP Visit Diagnosis Dysphagia, oropharyngeal phase (R13.12) Attention and concentration deficit following -- Frontal lobe and executive function deficit following -- Impact on safety and function Moderate aspiration risk   CHL IP TREATMENT RECOMMENDATION 11/09/2017 Treatment Recommendations Therapy as outlined in treatment plan below   Prognosis 11/09/2017 Prognosis for Safe Diet Advancement Fair Barriers to Reach Goals Other (Comment) Barriers/Prognosis Comment -- CHL IP DIET RECOMMENDATION 11/09/2017 SLP Diet Recommendations Dysphagia 2 (Fine chop) solids;Nectar thick liquid;Ice chips PRN after oral care Liquid Administration via Straw;Cup Medication Administration Whole meds with puree Compensations Slow rate;Small sips/bites;Follow solids with liquid;Other (Comment) Postural Changes --   CHL IP OTHER RECOMMENDATIONS 11/09/2017 Recommended Consults -- Oral Care Recommendations Oral care QID Other  Recommendations Order thickener from pharmacy;Have oral suction available   CHL IP FOLLOW UP RECOMMENDATIONS 11/08/2017 Follow up Recommendations Skilled Nursing facility   [facility] IP FREQUENCY AND DURATION 11/09/2017 Speech Therapy Frequency (ACUTE ONLY) min 2x/week Treatment Duration 1 week      CHL IP ORAL PHASE 11/09/2017 Oral Phase Impaired Oral - Pudding Teaspoon -- Oral - Pudding Cup -- Oral - Honey Teaspoon -- Oral - Honey Cup -- Oral - Nectar Teaspoon Premature spillage Oral - Nectar Cup -- Oral - Nectar Straw Premature spillage Oral - Thin Teaspoon Premature spillage Oral - Thin Cup Premature spillage Oral - Thin Straw Premature spillage Oral - Puree Premature spillage Oral - Mech Soft Premature spillage;Impaired mastication;Delayed oral transit Oral - Regular -- Oral - Multi-Consistency -- Oral - Pill WFL Oral Phase - Comment --  CHL IP PHARYNGEAL PHASE 11/09/2017 Pharyngeal Phase Impaired Pharyngeal- Pudding Teaspoon -- Pharyngeal -- Pharyngeal- Pudding Cup -- Pharyngeal -- Pharyngeal- Honey Teaspoon -- Pharyngeal -- Pharyngeal- Honey Cup -- Pharyngeal -- Pharyngeal- Nectar Teaspoon Delayed swallow initiation-pyriform sinuses;Reduced laryngeal elevation;Reduced airway/laryngeal closure Pharyngeal -- Pharyngeal- Nectar Cup -- Pharyngeal -- Pharyngeal- Nectar Straw Delayed swallow initiation-pyriform sinuses;Penetration/Aspiration before swallow;Penetration/Aspiration during swallow;Reduced laryngeal elevation;Reduced airway/laryngeal closure;Reduced tongue base retraction Pharyngeal Material enters airway, remains ABOVE vocal cords and not ejected out Pharyngeal- Thin Teaspoon Delayed swallow initiation-pyriform sinuses;Reduced airway/laryngeal closure;Reduced laryngeal elevation Pharyngeal -- Pharyngeal- Thin Cup Delayed swallow initiation-pyriform sinuses;Reduced laryngeal elevation;Reduced airway/laryngeal closure;Penetration/Aspiration before swallow;Penetration/Aspiration during swallow;Reduced tongue base  retraction Pharyngeal Material enters airway, CONTACTS cords and then ejected out Pharyngeal- Thin Straw Delayed swallow initiation-pyriform sinuses;Reduced tongue base retraction;Reduced airway/laryngeal closure;Reduced laryngeal elevation;Penetration/Aspiration before swallow;Penetration/Aspiration during swallow Pharyngeal Material enters airway, remains ABOVE vocal cords and not ejected out Pharyngeal- Puree Delayed swallow initiation-vallecula;Pharyngeal residue - valleculae;Reduced tongue base retraction Pharyngeal -- Pharyngeal- Mechanical Soft Delayed swallow initiation-vallecula;Reduced tongue base retraction;Pharyngeal residue - valleculae Pharyngeal -- Pharyngeal- Regular -- Pharyngeal -- Pharyngeal- Multi-consistency -- Pharyngeal -- Pharyngeal- Pill WFL;Pharyngeal residue - valleculae Pharyngeal -- Pharyngeal Comment chin tuck not consistently performed despite total cues, dry swallows difficult for pt to perform, following solids with liquids helpful to decrease residuals, pt did not aspirate despite consumption of liquids sequentially, trace penetration to vocal folds x1 without sensation, cued cough and throat clear did not clear penetrates, pt with shallow pyriform sinus allowing penetration   CHL IP CERVICAL ESOPHAGEAL PHASE 11/09/2017 Cervical Esophageal Phase (No Data) Pudding Teaspoon -- Pudding Cup -- Honey Teaspoon -- Honey Cup -- Nectar Teaspoon -- Nectar Cup -- Nectar Straw -- Thin Teaspoon -- Thin Cup -- Thin Straw -- Puree -- Mechanical Soft -- Regular -- Multi-consistency -- Pill -- Cervical Esophageal Comment -- Macario Golds 11/09/2017, 9:43 AM  Luanna Salk, MS [place] Pager [phone] Office [phone]             Scheduled Meds: . ALPRAZolam  0.5 mg Oral TID  . amiodarone  400 mg Oral Daily  . apixaban  5 mg Oral BID  . atorvastatin  40 mg Oral Daily  . benzonatate  100 mg Oral TID  . chlorhexidine  15 mL Mouth Rinse BID  .  chlorpheniramine-HYDROcodone  5 mL Oral Q12H  . guaiFENesin  1,200 mg Oral BID  . levETIRAcetam  500 mg Oral BID  . lidocaine  1 patch Transdermal Q24H  . mouth rinse  15 mL Mouth Rinse q12n4p  . mometasone-formoterol  2 puff Inhalation BID  . multivitamin with minerals  1 tablet Oral Daily  . nicotine  21 mg Transdermal Daily  . nutrition supplement (JUVEN)  1 packet Oral BID BM  . nystatin  5 mL Oral QID  . oxybutynin  5 mg Oral TID  . pantoprazole  40 mg Oral Q1200  . polyethylene glycol  17 g Oral Daily  . potassium chloride SA  20 mEq Oral QODAY  . predniSONE  5 mg Oral Q breakfast  . risperiDONE  1 mg Oral BID  . valproic acid  250 mg Oral Q8H   Continuous Infusions: . sodium chloride 75 mL/hr at 11/09/17 1030  . ampicillin-sulbactam (UNASYN) IV Stopped (11/09/17 1307)  . [START ON 11/10/2017] fluconazole (DIFLUCAN) IV      LOS: 7 days   Kerney Elbe, DO [facility] PAGER is on [place]  If 7PM-7AM, please contact night-coverage [email or web address] Password [facility] 11/09/2017, 5:40 PM

## 2017-11-09 NOTE — Progress Notes (Signed)
Modified Barium Swallow Progress Note  Patient Details  Name: Patrick Holt MRN: [id number] Date of Birth: [birth year]  Today's Date: 11/09/2017  Modified Barium Swallow completed.  Full report located under Chart Review in the Imaging Section.  Brief recommendations include the following:  Clinical Impression  Patient presents with mild oral and moderate pharyngeal dysphagia with sensorimotor deficits.  He did not aspirate despite sequential swallows of thin and nectar.  Consistent laryngeal penetration of thin noted (x1 to vocal folds) that did not clear with cued throat clearing/cough.  Penetration occured due to decreased laryngeal closure, shallow pyriform sinuses allowing dumping into larynx before swallow.  Tongue base retraction impaired allowing residuals mostly in vallecular region with solids = liquids aid clearance.  Pt did cough and expectorate large bolus of viscous secretions during MBS with verbal cueing.  Secretion retention noted in pharynx which likely contributes to pt's dysphagia.  Given pt positioning is an issue, would recommend conservative diet of dys2/nectar = allowing tsps thin and ice chips with strict precautions. Will follow up for dysphagia management, treatment.  Hopeful for improvement with pt becoming stronger.  Would recommend consider palliative consult given recurrent admissions to the hospital in the last six months and current medical status.  Thanks for allowing me to help with this pt's care plan.     Swallow Evaluation Recommendations       SLP Diet Recommendations: Dysphagia 2 (Fine chop)- cohesive foods- solids;Nectar thick liquid;Ice chips PRN after oral care(tsps thin ok)   Liquid Administration via: Straw;Cup   Medication Administration: Whole meds with puree- or crushed if large   Supervision: Full assist for feeding   Compensations: Slow rate;Small sips/bites;Follow solids with liquid;Other (Comment)(intermittent cough/hock, cease po if pt  coughing)       Oral Care Recommendations: Oral care QID   Other Recommendations: Order thickener from pharmacy;Have oral suction available  Luanna Salk, [facility] SLP Acute Rehab Services Pager [phone] Office [phone]   Patrick Holt 11/09/2017,9:42 AM

## 2017-11-09 NOTE — Progress Notes (Signed)
OT Cancellation Note  Patient Details Name: Patrick Holt MRN: [id number] DOB: September 15, 1950   Cancelled Treatment:    Reason Eval/Treat Not Completed: Fatigue/lethargy limiting ability to participate Pt had just finished being bathed with CNA.  Pt fatigued. Per CNA pt needed extensive A with bed mobility. Will check on pt next day Kari Baars, [place] [phone] Office- [phone], Thereasa Parkin 11/09/2017, 2:45 PM

## 2017-11-09 NOTE — Progress Notes (Signed)
  Speech Language Pathology Treatment: Dysphagia  Patient Details Name: Patrick Holt MRN: [id number] DOB: April 08, 1950 Today's Date: 11/09/2017 Time: 1696-7893 SLP Time Calculation (min) (ACUTE ONLY): 12 min  Assessment / Plan / Recommendation Clinical Impression  Pt seen to educate him regarding dietary recommendations and swallow precautions.  Observed pt consuming medications with puree given by RN and nectar thickened soda.  Delayed swallow continues but no indication of aspiration.  Pt requires max cueing for chin tuck posture with nectar via straw.  Using teach back to cough/expectorate, demonstration and providing oral suction in hand- pt educated to importance of precautions.  Will follow up for dysphagia management and RMST.  Pt again confirms no dysphagia prior to admission - hopeful for improvement.  Swallow precaution signs posted and RN/pt educated.     HPI HPI: Pt is a 67 y.o. M with significant PMH of multiple comorbidities including atrial fibrillation, diastolic dysfunction CHF, COPD, conversion disorder, asthma, right heart failure, chronic lower extremity stasis dermatitis. Previous hospitalization for PE, now admited s/p fall with prolonged down time, encephalitis. 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 Pt was transferred to ICU due to respiratory deficits and concern for aspiration.  BSE completed with recommendation for MBS to allow instrumental swallow evaluation.        SLP Plan  Continue with current plan of care       Recommendations  Diet recommendations: Dysphagia 2 (fine chop);Nectar-thick liquid;Other(comment)(tsps thin and single ice chips ok) Liquids provided via: Teaspoon;Straw Medication Administration: Crushed with puree Supervision: Full supervision/cueing for compensatory strategies;Staff to assist with self feeding Compensations: Slow rate;Small sips/bites;Chin tuck;Follow solids with liquid(tuck chin with liquids -  straw, cough/expectorate if voice gurgly) Postural Changes and/or Swallow Maneuvers: Seated upright 90 degrees;Upright 30-60 min after meal                Oral Care Recommendations: Oral care before and after PO Follow up Recommendations: Skilled Nursing facility SLP Visit Diagnosis: Dysphagia, oropharyngeal phase (R13.12) Plan: Continue with current plan of care       GO                Macario Golds 11/09/2017, 11:02 AM  Luanna Salk, MS [facility] SLP Acute Rehab Services Pager [phone] Office [phone]

## 2017-11-09 NOTE — Progress Notes (Signed)
Pharmacy Antibiotic Note  Patrick Holt is a 67 y.o. male admitted on 10/31/2017 with generalized weakness, fall, and inability to ambulate.  Pharmacy consulted to dose fluconazole for oropharyngeal candidiasis.  Plan: Fluconazole 200mg  IV x 1, then 100mg  IV q24h - recommend tx for 7 days No dose further adjustments needed Pharmacy will sign-off, please re-consult if needed  Height: 5\' 3"  (160 cm) Weight: 211 lb (95.7 kg) IBW/kg (Calculated) : 56.9  Temp (24hrs), Avg:98.2 F (36.8 C), Min:97.6 F (36.4 C), Max:99.1 F (37.3 C)  Recent Labs  Lab 11/03/17 0536 11/07/17 0940 11/07/17 1130 11/07/17 1405 11/08/17 0305 11/09/17 0806  WBC 7.0 9.8  --   --  9.1 6.5  CREATININE  --  0.85  --   --  0.69 0.67  LATICACIDVEN  --   --  1.0 0.8  --   --     Estimated Creatinine Clearance: 90.5 mL/min (by C-G formula based on SCr of 0.67 mg/dL).    Allergies  Allergen Reactions  . Immune Globulins Other (See Comments)    Acute renal failure  . Rho (D) Immune Globulin     Acute renal failure Acute renal failure  . Ciprofloxacin Swelling  . Flagyl [Metronidazole] Swelling  . Lisinopril Diarrhea  . Sulfa Antibiotics Other (See Comments)    blisters  . Sulfasalazine     blisters blisters  . Cefepime Other (See Comments)    Diffuse maculopapular rash when receiving both vanc and cefepime, timing unclear.  . Morphine And Related Other (See Comments)    Ineffective for pain management  . Requip [Ropinirole Hcl] Other (See Comments)    constipation  . Vancomycin     Diffuse maculopapular rash when receiving both vanc/cefepime.  Timing unclear.    . Betamethasone Dipropionate Other (See Comments)    Unknown  . Betamethasone Dipropionate     Unknown  . Bupropion     Unknown  . Bupropion Hcl Other (See Comments)    Unknown  . Clobetasol Other (See Comments)    Unknown Unknown  . Codeine Other (See Comments)    Does not help with pain  . Escitalopram Oxalate Other (See  Comments)    Unknown  . Escitalopram Oxalate     Unknown  . Fluoxetine Hcl Other (See Comments)    Unknown  . Fluoxetine Hcl     Unknown  . Furosemide Other (See Comments)    Unknown Unknown  . Meclizine Rash  . Paroxetine Other (See Comments)    Unknown  . Penicillins Other (See Comments)    Unknown reaction Has patient had a PCN reaction causing immediate rash, facial/tongue/throat swelling, SOB or lightheadedness with hypotension: unknown Has patient had a PCN reaction causing severe rash involving mucus membranes or skin necrosis: unknown Has patient had a PCN reaction that required hospitalization unknown Has patient had a PCN reaction occurring within the last 10 years: unknown If all of the above answers are "NO", then may proceed with Cephalosporin use. Unknown Unknown reactio  . Tacrolimus Other (See Comments)    Unknown  . Tetanus Toxoid Other (See Comments)    Unknown  . Tuberculin Purified Protein Derivative Other (See Comments)    Unknown    Antimicrobials this admission: 9/23 Unasyn >> 9/25 Fluconazole >>  Dose adjustments this admission: none  Microbiology results: 9/23 BCx: ngtd 9/23 MRSA PCR: negative  Thank you for allowing pharmacy to be a part of this patient's care.  Peggyann Juba, PharmD, BCPS Pager: [phone] 11/09/2017  11:11 AM

## 2017-11-09 NOTE — Care Management Note (Signed)
Case Management Note  Patient Details  Name: Patrick Holt MRN: [id number] Date of Birth: [birth year]  Subjective/Objective:                  Dysphagia, o2 via [place],Iv unasyn, temp99.1/  Action/Plan: Following for progression of care and level of care. Following for cm needs, none present at this time.  Expected Discharge Date:  11/04/17               Expected Discharge Plan:     In-House Referral:     Discharge planning Services  CM Consult  Post Acute Care Choice:    Choice offered to:     DME Arranged:    DME Agency:     HH Arranged:    HH Agency:     Status of Service:  In process, will continue to follow  If discussed at Long Length of Stay Meetings, dates discussed:    Additional Comments:  Leeroy Cha, RN 11/09/2017, 9:47 AM

## 2017-11-10 ENCOUNTER — Inpatient Hospital Stay (HOSPITAL_COMMUNITY): Payer: Medicare Other

## 2017-11-10 LAB — CBC WITH DIFFERENTIAL/PLATELET
BASOS ABS: 0 10*3/uL (ref 0.0–0.1)
BASOS PCT: 0 %
EOS ABS: 0.1 10*3/uL (ref 0.0–0.7)
EOS PCT: 1 %
HCT: 34.3 % — ABNORMAL LOW (ref 39.0–52.0)
Hemoglobin: 10.6 g/dL — ABNORMAL LOW (ref 13.0–17.0)
Lymphocytes Relative: 39 %
Lymphs Abs: 2 10*3/uL (ref 0.7–4.0)
MCH: 29.7 pg (ref 26.0–34.0)
MCHC: 30.9 g/dL (ref 30.0–36.0)
MCV: 96.1 fL (ref 78.0–100.0)
Monocytes Absolute: 0.5 10*3/uL (ref 0.1–1.0)
Monocytes Relative: 9 %
Neutro Abs: 2.6 10*3/uL (ref 1.7–7.7)
Neutrophils Relative %: 51 %
PLATELETS: 223 10*3/uL (ref 150–400)
RBC: 3.57 MIL/uL — ABNORMAL LOW (ref 4.22–5.81)
RDW: 18.4 % — AB (ref 11.5–15.5)
WBC: 5.1 10*3/uL (ref 4.0–10.5)

## 2017-11-10 LAB — COMPREHENSIVE METABOLIC PANEL
ALBUMIN: 2 g/dL — AB (ref 3.5–5.0)
ALT: 29 U/L (ref 0–44)
ANION GAP: 6 (ref 5–15)
AST: 24 U/L (ref 15–41)
Alkaline Phosphatase: 55 U/L (ref 38–126)
BUN: 14 mg/dL (ref 8–23)
CHLORIDE: 105 mmol/L (ref 98–111)
CO2: 31 mmol/L (ref 22–32)
Calcium: 8.4 mg/dL — ABNORMAL LOW (ref 8.9–10.3)
Creatinine, Ser: 0.6 mg/dL — ABNORMAL LOW (ref 0.61–1.24)
GFR calc Af Amer: >60 mL/min (ref >60)
GFR calc non Af Amer: >60 mL/min (ref >60)
GLUCOSE: 112 mg/dL — AB (ref 70–99)
POTASSIUM: 4.1 mmol/L (ref 3.5–5.1)
SODIUM: 142 mmol/L (ref 135–145)
Total Bilirubin: 0.6 mg/dL (ref 0.3–1.2)
Total Protein: 5 g/dL — ABNORMAL LOW (ref 6.5–8.1)

## 2017-11-10 LAB — MAGNESIUM: Magnesium: 2 mg/dL (ref 1.7–2.4)

## 2017-11-10 LAB — OCCULT BLOOD X 1 CARD TO LAB, STOOL: FECAL OCCULT BLD: NEGATIVE

## 2017-11-10 LAB — PHOSPHORUS: Phosphorus: 3.3 mg/dL (ref 2.5–4.6)

## 2017-11-10 MED ORDER — AMOXICILLIN-POT CLAVULANATE 875-125 MG PO TABS: 1.0000 | ORAL_TABLET | Freq: Two times a day (BID) | ORAL | 0 refills | Status: DC

## 2017-11-10 MED ORDER — LIDOCAINE 5 % EX PTCH: 1.0000 | MEDICATED_PATCH | CUTANEOUS | 0 refills | Status: AC

## 2017-11-10 MED ORDER — CEFDINIR 300 MG PO CAPS
300.0000 mg | ORAL_CAPSULE | Freq: Two times a day (BID) | ORAL | Status: DC
  Filled 2017-11-10: qty 1

## 2017-11-10 MED ORDER — AMIODARONE HCL 400 MG PO TABS: 400.0000 mg | ORAL_TABLET | Freq: Every day | ORAL | 0 refills | Status: AC

## 2017-11-10 MED ORDER — RESOURCE THICKENUP CLEAR PO POWD: 1.0000 | ORAL | 0 refills | Status: AC | PRN

## 2017-11-10 MED ORDER — NYSTATIN 100000 UNIT/ML MT SUSP: 5.0000 mL | Freq: Four times a day (QID) | OROMUCOSAL | 0 refills | Status: AC

## 2017-11-10 MED ORDER — ALBUTEROL SULFATE (2.5 MG/3ML) 0.083% IN NEBU: 2.5000 mg | INHALATION_SOLUTION | RESPIRATORY_TRACT | 12 refills | Status: AC | PRN

## 2017-11-10 MED ORDER — CEFDINIR 300 MG PO CAPS: 300.0000 mg | ORAL_CAPSULE | Freq: Two times a day (BID) | ORAL | 0 refills | Status: AC

## 2017-11-10 MED ORDER — AMOXICILLIN-POT CLAVULANATE 875-125 MG PO TABS: 1.0000 | ORAL_TABLET | Freq: Two times a day (BID) | ORAL | Status: DC

## 2017-11-10 MED ORDER — FLUCONAZOLE 100 MG PO TABS: 100.0000 mg | ORAL_TABLET | Freq: Every day | ORAL | Status: DC

## 2017-11-10 MED ORDER — FLUCONAZOLE 100 MG PO TABS: 100.0000 mg | ORAL_TABLET | Freq: Every day | ORAL | 0 refills | Status: AC

## 2017-11-10 MED ORDER — BENZONATATE 100 MG PO CAPS: 100.0000 mg | ORAL_CAPSULE | Freq: Three times a day (TID) | ORAL | 0 refills | Status: AC

## 2017-11-10 NOTE — Progress Notes (Signed)
Physical Therapy Treatment Patient Details Name: Patrick Holt MRN: [id number] DOB: 1950-08-19 Today's Date: 11/10/2017    History of Present Illness Pt is a 67 y.o. M with significant PMH of multiple comorbidities including atrial fibrillation, diastolic dysfunction CHF, COPD, conversion disorder, asthma, right heart failure, chronic lower extremity stasis dermatitis. Previous hospitalization for PE, now admited s/p fall with prolonged down time. Imaging showed small infarct L parietal/temporal junction.    PT Comments    Pt more motivated to participate in PT today, even though upon entering pt's room he states he feels like he's "barely hanging on". Pt requiring max assist for bed mobility and transfers today, and unable to achieve full upright positioning with sit to stands, x3 attempts. Consider stedy for transfers next session to facilitate standing and transfer to chair. Pt with fair sitting balance at the EOB, only requiring external support from PT with fatigue. PT to continue to progress mobility as able. Will continue to follow.    Follow Up Recommendations  SNF;Supervision/Assistance - 24 hour     Equipment Recommendations  None recommended by PT    Recommendations for Other Services       Precautions / Restrictions Precautions Precautions: Fall Precaution Comments: multiple falls; L arm/shoulder very painful  Restrictions Weight Bearing Restrictions: No    Mobility  Bed Mobility Overal bed mobility: Needs Assistance Bed Mobility: Supine to Sit;Sit to Supine;Rolling Rolling: Max assist;+2 for physical assistance;+2 for safety/equipment   Supine to sit: Mod assist;+2 for physical assistance;HOB elevated Sit to supine: Max assist;+2 for physical assistance;+2 for safety/equipment;HOB elevated   General bed mobility comments: Rolling for bed pad placement, max assist for trunk and LE management. When rolling to L, PT protracted scapula to avoid stress on shoulder  affected by infarct. Pt with some UE initiation of rolling. Pt mod assist for supine to sit, as pt assisted with moving LEs to EOB, trunk elevatiom, UE use on bed rails. Max assist sit to supine for bilat LEs and trunk lowering, scooting up in bed after cessation of treatment.   Transfers Overall transfer level: Needs assistance Equipment used: Rolling walker (2 wheeled) Transfers: Sit to/from Stand Sit to Stand: Max assist;+2 physical assistance;+2 safety/equipment;From elevated surface         General transfer comment: attempted sit to stand x3, pt unable to initiate hip extension and PT unable to facilitate gluteals enough for pt to achieve full upright standing. Bilat LE blocking required, as bilat LEs sliding forward without assist. Pt unable to maintain LUE on RW. Pt was disappointed that he was unable to stand today. Rests between each sit to stand attempt.   Ambulation/Gait                 Stairs             Wheelchair Mobility    Modified Rankin (Stroke Patients Only)       Balance Overall balance assessment: Needs assistance Sitting-balance support: Feet supported;Single extremity supported Sitting balance-Leahy Scale: Poor Sitting balance - Comments: EOB sitting approximately 15 minutes total, pt encouraged to lean trunk forward as pt in posterior lean. Pt also encouraged to center head, as pt with R cervical lateral flexion and rotation.  Postural control: Posterior lean Standing balance support: Bilateral upper extremity supported Standing balance-Leahy Scale: Poor Standing balance comment: relies on BUE support, PT support  Cognition Arousal/Alertness: Awake/alert Behavior During Therapy: WFL for tasks assessed/performed;Agitated(frustration with mobility at times, somewhat demanding ) Overall Cognitive Status: No family/caregiver present to determine baseline cognitive functioning Area of Impairment: Problem  solving                             Problem Solving: Slow processing;Difficulty sequencing;Decreased initiation        Exercises      General Comments General comments (skin integrity, edema, etc.): pt with WNL smooth pursuits, maintains eyes looking upward and to the R when not cued.       Pertinent Vitals/Pain Pain Assessment: Faces Faces Pain Scale: Hurts little more Pain Location: LUE with movement Pain Descriptors / Indicators: Aching;Guarding;Grimacing Pain Intervention(s): Limited activity within patient's tolerance;Repositioned;Monitored during session    Home Living                      Prior Function            PT Goals (current goals can now be found in the care plan section) Acute Rehab PT Goals Patient Stated Goal: none stated PT Goal Formulation: With patient Time For Goal Achievement: 11/17/17 Potential to Achieve Goals: Fair Progress towards PT goals: Progressing toward goals    Frequency    Min 2X/week      PT Plan Current plan remains appropriate    Co-evaluation              AM-PAC PT "6 Clicks" Daily Activity  Outcome Measure  Difficulty turning over in bed (including adjusting bedclothes, sheets and blankets)?: Unable Difficulty moving from lying on back to sitting on the side of the bed? : Unable Difficulty sitting down on and standing up from a chair with arms (e.g., wheelchair, bedside commode, etc,.)?: Unable Help needed moving to and from a bed to chair (including a wheelchair)?: Total Help needed walking in hospital room?: Total Help needed climbing 3-5 steps with a railing? : Total 6 Click Score: 6    End of Session Equipment Utilized During Treatment: Gait belt Activity Tolerance: Patient limited by fatigue;Patient limited by pain Patient left: in bed;with nursing/sitter in room;with bed alarm set;with call bell/phone within reach Nurse Communication: Mobility status PT Visit Diagnosis: Muscle  weakness (generalized) (M62.81);Other abnormalities of gait and mobility (R26.89)     Time: 6503-5465 PT Time Calculation (min) (ACUTE ONLY): 37 min  Charges:  $Therapeutic Activity: 23-37 mins                     Julien Girt, PT Acute Rehabilitation Services Pager [phone]  Office [phone]    Tenecia Ignasiak D Avital Dancy 11/10/2017, 2:21 PM

## 2017-11-10 NOTE — Discharge Summary (Addendum)
Physician Discharge Summary  Patrick Holt YKD:[id number] DOB: 21-Jun-1950 DOA: 10/31/2017  PCP: [facility], [facility]  Admit date: 10/31/2017 Discharge date: 11/10/2017  Admitted From: Home Disposition: Home with [place] PT/OT/RN/Aide/SW as patient refused SNF Placement   Recommendations for Outpatient Follow-up:  1. Follow up with PCP in 1-2 weeks 2. Follow up with Orthopedic Surgery as an outpatient in 1-2 weeks 3. Follow up with Neurology as an outpatient  4. Repeat CXR in 3-6 weeks  5. Please obtain CMP/CBC, Mag, Phos in one week 6. Please follow up on the following pending results:  Home Health: Yes Equipment/Devices: None Recommended by PT  Discharge Condition: Stable CODE STATUS: FULL CODE Diet recommendation: Dysphagia 2 Diet with Nectar Thick Liquids  Brief/Interim Summary: Patrick Lycan Overcashwith atrial fibrillation, diastolic dysfunction CHF, COPD, conversion disorder, asthma, right heart failure, chronic lower extremity stasis dermatitis who presented to the ER with generalized weakness, fall, and inability to ambulate. He was just discharged from rehab facility a couple of days before. He laid on the floor for 2 days. He was noted to have left arm weakness and pain and underwent imaging of his brain. Noted to have a CVA which likely was not causing his left arm findings. Also suspected to have had (possibly) a seizure.  He stabilized quickly and we were awaiting a SNF bed when he developed a low grade fever, paroxysmal episodes of A-fib with RVR and new lung infiltrates with hypoxia. He was moved to SDU and began on treatment for pneumonia and improve so he was transferred to the telemetry floor.  Patient's oxygen requirements were weaned down to 1 L however he was unable to wean off completely.  PT evaluated the patient and recommending skilled nursing facility however patient did not want to go to skilled nursing facility as he had to pay out of pocket so he  elected to go home with home health services and likely will go to skilled nursing facility directly from home once his Medicaid status is approved.  At this time he is deemed medically stable to be discharged home as he is improved while being hospitalized.  Patient still remains significantly weak however he does not want to go to a skilled nursing facility currently due to an out-of-pocket expense and has elected to go home with the knowledge that he may get readmitted and he understands this risk.  Discharge Diagnoses:  Principal Problem:   Acute metabolic encephalopathy Active Problems:   DEPRESSION, MAJOR   COPD (chronic obstructive pulmonary disease) (HCC)   Atrial fibrillation (HCC)   Difficulty in walking, not elsewhere classified   Seizure disorder ([place])   History of pulmonary embolism   Left-sided weakness   Fall   Atrial fibrillation with RVR ([place])   Aspiration pneumonia of both lungs due to gastric secretions ([place])  Difficulty in Walking/ Falls - this is the original reason for admission- he fell 2 days after leaving rehab and layed on the floor for 2 days - PT evalcompleted>> will need SNF- awaiting bed but patient refusing to go due to Insurance Reasons and does not want to pay out of pocket. Will be D/C'd Home with [place] that patient has a high-risk of re-admission and he understands but would rather go home as opposed to paying out of pocket for SNF  9/23>Fever, hypoxia-> pneumonia, likely aspiration - CXR shows new infiltrates bilaterally>pneumonia vs edema - Due to fever it was suspected this is a pneumonia-  he was here early this month with b/l PE and HCAP and was discharged on Doxycycline - ? If he is aspirating- will obtain SLP eval as well; Recommending Dysphagia 2 Diet with Nectar Thick Liquids -Started IVUnasyn and will change to po Augmentin at D/C for 10 day total (PCN Allergy Listed but tolerating Unasyn during hospitalization)  to complete course  **ADDENDUM: 15:55 Primary Pharmacy called and refusing to fill Augmentin Dose due to Allergies despite patient tolerating IV Unasyn. Discussed with our Pharmacist and ID Pharmacist and patient has had 4 days of Anaerobic Therapy and recommended Cefdinir for Antibiotic Coverage; ID Pharmacist to discuss case with Pharmacist filling Script -MRSA PCR neg -Fevers improved- is coughing and a little congested -started on Tussionex and Tessalon - SLP notes vagal, facial and hypoglossal nerve impairments on exam and feels he is aspirating- recommending NPO yesterday and MBS done today recommending Dysphagia 2 Diet with Nectar Thick Liquids -Started  IVF while NPO but now on a diet so will cut IVF to 50 mL/hr and likely stop tomorrow.  -O2 requirement is weaning patient is on 1 L this morning and will need it at D/C as was unable to be weaned and was saturating 82% on Room Air -Wean off of supplemental oxygen via nasal cannula if possible but for now will be D/C'd on Home O2  A-fib with RVR now in NSR -Likely triggered by fever, hypoxia-- has returned back to [facility] has maintained NSR since after IVF bolus -Continue Amiodarone 400 mg po Daily and Eliquis 5 mg po BID  Left-sided weakness, pain and swelling, improved slightly -Suspect he laid on his arm while he was on the floor and current injury is due to the trauma -He is quite tenderand swollen -Xray shoulder unrevealing- Ortho eval requested - Dr Lucia Gaskins recommending PT- axillary view xray of shoulder ordered and this is unrevealing -Upper extremity venous duplex done was negative for DVT -Continue PRN Oxycodone -Added K pad  -Elevate arm(not being done properly) -Lidocaine Patch  -Follow up with Orthopedic Surgery as an outpatient   Sepsis> Fever, rapid heart rate, pneumonia -As above -Sepsis Physiology much improved -Now Afebrile and has no Leukocytosis -Respiratory status is improving but will need O2 at D/C    CVA - MRI- 5 mm acute infarction affecting the cortical and subcortical brain at the deep insula/temporoparietal junction region on the left. No mass effect or hemorrhage. Atrophy and extensive chronic small-vessel ischemic changes elsewhere. -Neuro assisting with management-neuro feels this is anincidental findingand not related to left arm weakness -Has h/o A-fib- cont Eliquis which he did miss for 2 days while he was on the floor -Carotid duplex >> Bilateral ICAs 1-39% stenosis. Bilateral vertebral arteries patent with antegrade flow -2 D ECHO> no thrombus- see report below -PT/OT recommending SNF but patient unwilling to go to SNF currently due to Insurance Reasons and rather go home and states he will go to SNF from [place] recommending Dysphagia 2 Diet with Nectar Thick Liquids; Will continue at D/C -Follow up with Neurology as an outpatient   Acute Encephalopathy, Significantly improved Seizure Disorder  - Note on 9/17> "difficult to arouse and eyes rolled back and fluttering" - ? whether he had a seizure- was loaded with Keppra 1.5 gm - STAT EEG unrevealing- see below -Cont Keppra and Depakote as on MAR and home Dose   Severe upper extremity tremors -Needs to be fed all meals -? Parkinsonism  -Will need to follow up with Neurology at D/C  Depression -C/w Respiridone 1 mg po BID   COPD (chronic obstructive pulmonary disease)  Nicotine Abuse -Stable -Continue Home Nebs, Duoneb 3 mL q6hprn Wheezing, and Prednisone 5 mg po Daily  -Continue with Nicotine Patch 21 mg TD Daily  History of Recent Bilateral Pulmonary Embolism -Continue Anticoagulation with Apixaban  GERD -Continue with PPI with Pantoprazole 40 mg p.o. daily  Oral Candidiasis -Started the patient on IV Diflucan with pharmacy to dose and will change to po 100 mg po Daily x7 Days total   -Also started Nystatin swish and spit 4 times a day   Discharge Instructions  Discharge Instructions     Call MD for:  difficulty breathing, headache or visual disturbances   Complete by:  As directed    Call MD for:  extreme fatigue   Complete by:  As directed    Call MD for:  hives   Complete by:  As directed    Call MD for:  persistant dizziness or light-headedness   Complete by:  As directed    Call MD for:  persistant nausea and vomiting   Complete by:  As directed    Call MD for:  redness, tenderness, or signs of infection (pain, swelling, redness, odor or green/yellow discharge around incision site)   Complete by:  As directed    Call MD for:  severe uncontrolled pain   Complete by:  As directed    Call MD for:  temperature >100.4   Complete by:  As directed    Diet - low sodium heart healthy   Complete by:  As directed    Diet - low sodium heart healthy   Complete by:  As directed    Dysphagia 2 Diet with Nectar Thick Liquids   Increase activity slowly   Complete by:  As directed    Increase activity slowly   Complete by:  As directed      Allergies as of 11/10/2017      Reactions   Immune Globulins Other (See Comments)   Acute renal failure   Rho (d) Immune Globulin    Acute renal failure Acute renal failure   Ciprofloxacin Swelling   Flagyl [metronidazole] Swelling   Lisinopril Diarrhea   Sulfa Antibiotics Other (See Comments)   blisters   Sulfasalazine    blisters blisters   Cefepime Other (See Comments)   Diffuse maculopapular rash when receiving both vanc and cefepime, timing unclear.   Morphine And Related Other (See Comments)   Ineffective for pain management   Requip [ropinirole Hcl] Other (See Comments)   constipation   Vancomycin    Diffuse maculopapular rash when receiving both vanc/cefepime.  Timing unclear.     Betamethasone Dipropionate Other (See Comments)   Unknown   Betamethasone Dipropionate    Unknown   Bupropion    Unknown   Bupropion Hcl Other (See Comments)   Unknown   Clobetasol Other (See Comments)   Unknown Unknown   Codeine  Other (See Comments)   Does not help with pain   Escitalopram Oxalate Other (See Comments)   Unknown   Escitalopram Oxalate    Unknown   Fluoxetine Hcl Other (See Comments)   Unknown   Fluoxetine Hcl    Unknown   Furosemide Other (See Comments)   Unknown Unknown   Meclizine Rash   Paroxetine Other (See Comments)   Unknown   Penicillins Other (See Comments)   Unknown reaction Has patient had a PCN reaction causing immediate rash, facial/tongue/throat swelling, SOB  or lightheadedness with hypotension: unknown Has patient had a PCN reaction causing severe rash involving mucus membranes or skin necrosis: unknown Has patient had a PCN reaction that required hospitalization unknown Has patient had a PCN reaction occurring within the last 10 years: unknown If all of the above answers are "NO", then may proceed with Cephalosporin use. Unknown Unknown reactio   Tacrolimus Other (See Comments)   Unknown   Tetanus Toxoid Other (See Comments)   Unknown   Tuberculin Purified Protein Derivative Other (See Comments)   Unknown      Medication List    TAKE these medications   acetaminophen 325 MG tablet Commonly known as:  TYLENOL Take 2 tablets (650 mg total) by mouth every 6 (six) hours as needed for mild pain (or Fever >/= 101).   albuterol (2.5 MG/3ML) 0.083% nebulizer solution Commonly known as:  PROVENTIL Take 3 mLs (2.5 mg total) by nebulization every 4 (four) hours as needed for wheezing or shortness of breath.   ALPRAZolam 0.5 MG tablet Commonly known as:  XANAX Take 1 tablet (0.5 mg total) by mouth 3 (three) times daily.   amiodarone 400 MG tablet Commonly known as:  PACERONE Take 1 tablet (400 mg total) by mouth daily. Start taking on:  11/11/2017 What changed:    medication strength  how much to take   amoxicillin-clavulanate 875-125 MG tablet Commonly known as:  AUGMENTIN Take 1 tablet by mouth every 12 (twelve) hours for 7 days.   apixaban 5 MG Tabs  tablet Commonly known as:  ELIQUIS Take 10 mg oral twice daily for total of  days, then transition to 5 mg oral twice daily on 10/21/2017   atorvastatin 40 MG tablet Commonly known as:  LIPITOR Take 1 tablet (40 mg total) by mouth daily.   benzonatate 100 MG capsule Commonly known as:  TESSALON Take 1 capsule (100 mg total) by mouth 3 (three) times daily.   budesonide-formoterol 160-4.5 MCG/ACT inhaler Commonly known as:  SYMBICORT Inhale 2 puffs into the lungs 2 (two) times daily.   divalproex 250 MG DR tablet Commonly known as:  DEPAKOTE Take 1 tablet (250 mg total) by mouth 3 (three) times daily.   ergocalciferol 50000 units capsule Commonly known as:  VITAMIN D2 Take 1 capsule (50,000 Units total) by mouth once a week.   esomeprazole 40 MG capsule Commonly known as:  NEXIUM Take 1 capsule (40 mg total) by mouth daily.   fluconazole 100 MG tablet Commonly known as:  DIFLUCAN Take 1 tablet (100 mg total) by mouth daily. Start taking on:  11/11/2017   guaiFENesin 600 MG 12 hr tablet Commonly known as:  MUCINEX Take 2 tablets (1,200 mg total) by mouth 2 (two) times daily.   ipratropium-albuterol 0.5-2.5 (3) MG/3ML Soln Commonly known as:  DUONEB Inhale 3 mLs into the lungs every 6 (six) hours as needed. What changed:  reasons to take this   levETIRAcetam 500 MG tablet Commonly known as:  KEPPRA Take 1 tablet (500 mg total) by mouth 2 (two) times daily.   lidocaine 5 % Commonly known as:  LIDODERM Place 1 patch onto the skin daily. Remove & Discard patch within 12 hours or as directed by MD Start taking on:  11/11/2017   loperamide 2 MG capsule Commonly known as:  IMODIUM Take 1 capsule (2 mg total) by mouth as needed for diarrhea or loose stools.   multivitamin with minerals Tabs tablet Take 1 tablet by mouth daily.   nicotine 21 mg/24hr  patch Commonly known as:  NICODERM CQ - dosed in mg/24 hours Place 1 patch (21 mg total) onto the skin daily.   nutrition  supplement (JUVEN) Pack Take 1 packet by mouth 2 (two) times daily between meals.   nystatin 100000 UNIT/ML suspension Commonly known as:  MYCOSTATIN Take 5 mLs (500,000 Units total) by mouth 4 (four) times daily.   oxybutynin 5 MG tablet Commonly known as:  DITROPAN Take 1 tablet (5 mg total) by mouth 3 (three) times daily.   Potassium Chloride ER 20 MEQ Tbcr Take 20 mEq by mouth every other day.   predniSONE 10 MG tablet Commonly known as:  DELTASONE Take 0.5 tablets (5 mg total) by mouth daily with breakfast.   RESOURCE THICKENUP CLEAR Powd Take 120 g by mouth as needed.   risperiDONE 1 MG tablet Commonly known as:  RISPERDAL Take 1 tablet (1 mg total) by mouth 2 (two) times daily.   senna-docusate 8.6-50 MG tablet Commonly known as:  Senokot-S Take 1 tablet by mouth at bedtime as needed for mild constipation.            Durable Medical Equipment  (From admission, onward)         Start     Ordered   11/10/17 1515  DME Oxygen  Once    Question Answer Comment  Mode or (Route) Nasal cannula   Liters per Minute 2   Frequency Continuous (stationary and portable oxygen unit needed)   Oxygen conserving device No   Oxygen delivery system Gas      11/10/17 1514          Allergies  Allergen Reactions  . Immune Globulins Other (See Comments)    Acute renal failure  . Rho (D) Immune Globulin     Acute renal failure Acute renal failure  . Ciprofloxacin Swelling  . Flagyl [Metronidazole] Swelling  . Lisinopril Diarrhea  . Sulfa Antibiotics Other (See Comments)    blisters  . Sulfasalazine     blisters blisters  . Cefepime Other (See Comments)    Diffuse maculopapular rash when receiving both vanc and cefepime, timing unclear.  . Morphine And Related Other (See Comments)    Ineffective for pain management  . Requip [Ropinirole Hcl] Other (See Comments)    constipation  . Vancomycin     Diffuse maculopapular rash when receiving both vanc/cefepime.   Timing unclear.    . Betamethasone Dipropionate Other (See Comments)    Unknown  . Betamethasone Dipropionate     Unknown  . Bupropion     Unknown  . Bupropion Hcl Other (See Comments)    Unknown  . Clobetasol Other (See Comments)    Unknown Unknown  . Codeine Other (See Comments)    Does not help with pain  . Escitalopram Oxalate Other (See Comments)    Unknown  . Escitalopram Oxalate     Unknown  . Fluoxetine Hcl Other (See Comments)    Unknown  . Fluoxetine Hcl     Unknown  . Furosemide Other (See Comments)    Unknown Unknown  . Meclizine Rash  . Paroxetine Other (See Comments)    Unknown  . Penicillins Other (See Comments)    Unknown reaction Has patient had a PCN reaction causing immediate rash, facial/tongue/throat swelling, SOB or lightheadedness with hypotension: unknown Has patient had a PCN reaction causing severe rash involving mucus membranes or skin necrosis: unknown Has patient had a PCN reaction that required hospitalization unknown Has patient had a  PCN reaction occurring within the last 10 years: unknown If all of the above answers are "NO", then may proceed with Cephalosporin use. Unknown Unknown reactio  . Tacrolimus Other (See Comments)    Unknown  . Tetanus Toxoid Other (See Comments)    Unknown  . Tuberculin Purified Protein Derivative Other (See Comments)    Unknown   Consultations:  Neurology  Orthopedic Surgery   SLP, PT, OT  Procedures/Studies: Dg Chest 2 View  Result Date: 10/31/2017 CLINICAL DATA:  Fall 2 days ago with persistent chest pain, initial encounter EXAM: CHEST - 2 VIEW COMPARISON:  10/14/2017 FINDINGS: Cardiac shadow is within normal limits. The lungs are well aerated bilaterally. Minimal right basilar atelectasis is seen. No pneumothorax is. Degenerative changes of the thoracic spine are seen. No definitive rib fractures are noted. IMPRESSION: Mild right basilar atelectasis. No pneumothorax or rib abnormality is seen.  Electronically Signed   By: Inez Catalina M.D.   On: 10/31/2017 16:12   Dg Pelvis 1-2 Views  Result Date: 10/31/2017 CLINICAL DATA:  Recent fall 2 days ago with pelvic pain, initial encounter EXAM: PELVIS - 1 VIEW COMPARISON:  None. FINDINGS: Pelvic ring is intact. No acute fracture or dislocation is seen. No gross soft tissue abnormality is noted. IMPRESSION: No acute abnormality noted. Electronically Signed   By: Inez Catalina M.D.   On: 10/31/2017 16:14   Dg Shoulder 1v Left  Result Date: 11/02/2017 CLINICAL DATA:  Shoulder pain EXAM: LEFT SHOULDER - 1 VIEW COMPARISON:  10/31/2017 FINDINGS: Axillary view shows normal alignment.  No obvious fracture. IMPRESSION: No acute osseous abnormality Electronically Signed   By: Donavan Foil M.D.   On: 11/02/2017 19:22   Ct Head Wo Contrast  Result Date: 10/31/2017 CLINICAL DATA:  67 y/o M; fall to floor unable to move since Saturday. EXAM: CT HEAD WITHOUT CONTRAST CT CERVICAL SPINE WITHOUT CONTRAST TECHNIQUE: Multidetector CT imaging of the head and cervical spine was performed following the standard protocol without intravenous contrast. Multiplanar CT image reconstructions of the cervical spine were also generated. COMPARISON:  09/17/2017 CT head and cervical spine. FINDINGS: CT HEAD FINDINGS Brain: No evidence of acute infarction, hemorrhage, hydrocephalus, extra-axial collection or mass lesion/mass effect. Stable moderate chronic microvascular ischemic changes and volume loss of the brain. Stable chronic lacunar infarct within left paramedian pons. Vascular: Calcific atherosclerosis of carotid siphons. No hyperdense vessel. Skull: Normal. Negative for fracture or focal lesion. Sinuses/Orbits: Normal aeration of the visible paranasal sinuses and mastoid air cells. Left intra-ocular lens replacement. Other: Right-sided ossicular replacement prosthesis. CT CERVICAL SPINE FINDINGS Alignment: Normal. Skull base and vertebrae: No acute fracture. No primary bone  lesion or focal pathologic process. Chronic T1 spinous process fracture. Soft tissues and spinal canal: No prevertebral fluid or swelling. No visible canal hematoma. Disc levels: Intervertebral disc space heights are maintained. Small endplate marginal osteophytes are present at the C5-6 level. Upper chest: Negative. Other: Mild calcific atherosclerosis of the carotid bifurcations. IMPRESSION: CT head: 1. No acute intracranial abnormality or calvarial fracture. 2. Stable chronic microvascular ischemic changes, volume loss of the brain, and small chronic left paramedian pontine infarct. CT cervical spine: No acute fracture or dislocation. Chronic T1 spinous process fracture. Electronically Signed   By: Kristine Garbe M.D.   On: 10/31/2017 16:24   Ct Angio Chest Pe W And/or Wo Contrast  Result Date: 10/11/2017 CLINICAL DATA:  Cough and shortness of breath EXAM: CT ANGIOGRAPHY CHEST WITH CONTRAST TECHNIQUE: Multidetector CT imaging of the chest was performed  using the standard protocol during bolus administration of intravenous contrast. Multiplanar CT image reconstructions and MIPs were obtained to evaluate the vascular anatomy. CONTRAST:  128mL ISOVUE-370 IOPAMIDOL (ISOVUE-370) INJECTION 76% COMPARISON:  CTA chest 04/06/2017 FINDINGS: Cardiovascular: --Pulmonary arteries: Contrast injection is sufficient to demonstrate satisfactory opacification of the pulmonary arteries to the segmental level.There is a saddle pulmonary embolus that extends along both the right and left pulmonary arteries and into the right upper lobar, inter lobar and left lower lobar arteries. Emboli extend into proximal segmental branches of all right lobes and the left lower lobe. The main pulmonary artery is within normal limits for size. No evidence of right heart strain. --Aorta: Limited opacification of the aorta due to bolus timing optimization for the pulmonary arteries. Conventional 3 vessel aortic branching pattern. The  aortic course and caliber are normal. There is mild aortic atherosclerosis. --Heart: Normal size. No pericardial effusion. Mediastinum/Nodes: No mediastinal, hilar or axillary lymphadenopathy. The visualized thyroid and thoracic esophageal course are unremarkable. Lungs/Pleura: There is bibasilar atelectasis. No pleural effusion or pneumothorax. No evidence of acute pulmonary infarction. Upper Abdomen: Contrast bolus timing is not optimized for evaluation of the abdominal organs. Within this limitation, the visualized organs of the upper abdomen are normal. Musculoskeletal: There is extravasated contrast in the visualized right arm extending into the right axilla. Review of the MIP images confirms the above findings. IMPRESSION: 1. Acute saddle pulmonary embolus with emboli extending into the left lower lobe are artery and multiple left lower lobe segmental branches and into all right-sided lobar branches and multiple proximal right segmental branches. 2. No CT evidence of right heart strain. 3. Extravasated contrast material within the right arm and axilla. Critical Value/emergent results were called by telephone at the time of interpretation on 10/11/2017 at 7:42 pm to Dr. Tomi Bamberger, who verbally acknowledged these results. Aortic Atherosclerosis (ICD10-I70.0). Electronically Signed   By: Ulyses Jarred M.D.   On: 10/11/2017 19:43   Ct Cervical Spine Wo Contrast  Result Date: 10/31/2017 CLINICAL DATA:  67 y/o M; fall to floor unable to move since Saturday. EXAM: CT HEAD WITHOUT CONTRAST CT CERVICAL SPINE WITHOUT CONTRAST TECHNIQUE: Multidetector CT imaging of the head and cervical spine was performed following the standard protocol without intravenous contrast. Multiplanar CT image reconstructions of the cervical spine were also generated. COMPARISON:  09/17/2017 CT head and cervical spine. FINDINGS: CT HEAD FINDINGS Brain: No evidence of acute infarction, hemorrhage, hydrocephalus, extra-axial collection or mass  lesion/mass effect. Stable moderate chronic microvascular ischemic changes and volume loss of the brain. Stable chronic lacunar infarct within left paramedian pons. Vascular: Calcific atherosclerosis of carotid siphons. No hyperdense vessel. Skull: Normal. Negative for fracture or focal lesion. Sinuses/Orbits: Normal aeration of the visible paranasal sinuses and mastoid air cells. Left intra-ocular lens replacement. Other: Right-sided ossicular replacement prosthesis. CT CERVICAL SPINE FINDINGS Alignment: Normal. Skull base and vertebrae: No acute fracture. No primary bone lesion or focal pathologic process. Chronic T1 spinous process fracture. Soft tissues and spinal canal: No prevertebral fluid or swelling. No visible canal hematoma. Disc levels: Intervertebral disc space heights are maintained. Small endplate marginal osteophytes are present at the C5-6 level. Upper chest: Negative. Other: Mild calcific atherosclerosis of the carotid bifurcations. IMPRESSION: CT head: 1. No acute intracranial abnormality or calvarial fracture. 2. Stable chronic microvascular ischemic changes, volume loss of the brain, and small chronic left paramedian pontine infarct. CT cervical spine: No acute fracture or dislocation. Chronic T1 spinous process fracture. Electronically Signed   By: Mia Creek  F[place]-Stratton M.D.   On: 10/31/2017 16:24   Mr Brain Wo Contrast  Result Date: 11/01/2017 CLINICAL DATA:  [place] in the bathroom 3 days ago.  Possible stroke. EXAM: MRI HEAD WITHOUT CONTRAST TECHNIQUE: Multiplanar, multiecho pulse sequences of the brain and surrounding structures were obtained without intravenous contrast. COMPARISON:  CT studies 10/31/2017 FINDINGS: Brain: Diffusion imaging shows a 5 mm acute infarction affecting the cortical and subcortical brain at the deep insula/temporoparietal junction on the left. No swelling, hemorrhage or mass effect. No other acute infarction. There chronic small-vessel ischemic changes of the  pons. There are a few old small vessel cerebellar infarctions. Cerebral hemispheres elsewhere show chronic small-vessel ischemic changes the white matter. No large vessel territory infarction. Mass lesion, hemorrhage, hydrocephalus or extra-axial collection. Vascular: Major vessels at the base of the brain show flow. Skull and upper cervical spine: Negative Sinuses/Orbits: Clear/normal Other: None IMPRESSION: 5 mm acute infarction affecting the cortical and subcortical brain at the deep insula/temporoparietal junction region on the left. No mass effect or hemorrhage. Atrophy and extensive chronic small-vessel ischemic changes elsewhere. Electronically Signed   By: Nelson Chimes M.D.   On: 11/01/2017 12:41   Dg Chest Port 1 View  Result Date: 11/10/2017 CLINICAL DATA:  Shortness of Breath EXAM: PORTABLE CHEST 1 VIEW COMPARISON:  11/07/2017 FINDINGS: Cardiac shadow is enlarged in size but accentuated by the portable technique. Aortic calcifications are again noted. Bibasilar infiltrative changes and small effusions are noted. These have increased slightly in the interval from the prior exam. No pneumothorax is noted. No bony abnormality is noted. IMPRESSION: Slight increase in bibasilar infiltrates and effusions. Electronically Signed   By: Inez Catalina M.D.   On: 11/10/2017 09:22   Dg Chest Port 1 View  Result Date: 11/07/2017 CLINICAL DATA:  Evaluate hypoxia. EXAM: PORTABLE CHEST - 1 VIEW COMPARISON:  10/31/2017 FINDINGS: New interstitial and airspace infiltrates or edema, predominately in the right mid lung in the lung bases left greater than right. Central pulmonary vascular congestion. Heart size upper limits normal. Blunting of left lateral costophrenic angle suggesting small effusion. No pneumothorax. Visualized bones unremarkable. IMPRESSION: 1. Asymmetric infiltrates or edema, new since previous 2. Probable small left pleural effusion. Electronically Signed   By: Lucrezia Europe M.D.   On: 11/07/2017 10:43    Dg Chest Port 1 View  Result Date: 10/14/2017 CLINICAL DATA:  67 year old male with shortness of breath EXAM: PORTABLE CHEST 1 VIEW COMPARISON:  Chest radiograph dated 10/11/2017 and CT dated 10/11/2017 FINDINGS: Bilateral lower lobe airspace densities may represent atelectasis versus infiltrate, new since the radiograph of 10/11/2017. No pleural effusion or pneumothorax. Top-normal cardiac size. No acute osseous pathology. IMPRESSION: Bibasilar atelectasis/infiltrate. Electronically Signed   By: Anner Crete M.D.   On: 10/14/2017 21:09   Dg Shoulder Left  Result Date: 10/31/2017 CLINICAL DATA:  Fall 2 days ago with left shoulder pain, initial encounter EXAM: LEFT SHOULDER - 2+ VIEW COMPARISON:  06/26/2014 FINDINGS: Degenerative changes of the acromioclavicular joint are noted. No acute fracture or dislocation is seen. No soft tissue abnormality is noted. The underlying bony thorax is within normal limits. IMPRESSION: Mild degenerative change without acute abnormality. Electronically Signed   By: Inez Catalina M.D.   On: 10/31/2017 16:13   Dg Swallowing Func-speech Pathology  Result Date: 11/09/2017 Objective Swallowing Evaluation: Type of Study: MBS-Modified Barium Swallow Study  Patient Details Name: JKAI ARWOOD MRN: [id number] Date of Birth: 01/10/1951 Today's Date: 11/09/2017 Time: SLP Start Time (ACUTE ONLY): 2952 -SLP Stop  Time (ACUTE ONLY): 0855 SLP Time Calculation (min) (ACUTE ONLY): 23 min Past Medical History: Past Medical History: Diagnosis Date . Anemia  . Anxiety  . Asthma  . Atrial fibrillation ([place]) 01/2012  with RVR . Bipolar 1 disorder ([place])  . Cancer ([place])  . Collagen vascular disease ([place])  . Conversion disorder 06/2010 . COPD (chronic obstructive pulmonary disease) ([place])  . Dermatomyositis ([place])  . Dermatomyositis ([place])  . Diverticulitis  . Diverticulosis of colon  . Dysrhythmia   "irregular" (11/15/2012) . Esophageal dysmotility  . Esophageal stricture  . Fibromyalgia  .  Gastritis  . GERD (gastroesophageal reflux disease)  . Hand fracture, right   sept 13, 2017, still wearing splint as og 01-10-2017 . Headache(784.0)   "severe; get them often" (11/15/2012) . Hiatal hernia  . History of narcotic addiction ([place])  . Hx of adenomatous colonic polyps  . Hyperlipidemia  . Hypertension  . Internal hemorrhoids  . Ischemic heart disease  . Major depression   with acute psychotic break in 06/2010 . Myocardial infarct ([place])   mulitple (1999, 2000, 2004) . Narcotic dependence ([place])  . Nephrolithiasis  . Obesity  . OCD (obsessive compulsive disorder)  . Otosclerosis  . Paroxysmal A-fib ([place])  . Peripheral neuropathy  . Raynaud's disease  . Renal insufficiency  . Rheumatoid arthritis(714.0)  . Sarcoidosis  . Seizures ([place])  . Subarachnoid hemorrhage ([place]) 01/2012  with subdural  hematoma.  . Type II diabetes mellitus ([place])  . Urge incontinence  . Vertigo  Past Surgical History: Past Surgical History: Procedure Laterality Date . BACK SURGERY   . CARDIAC CATHETERIZATION   . CARPAL TUNNEL RELEASE Bilateral  . CATARACT EXTRACTION W/ INTRAOCULAR LENS IMPLANT Left  . COLONOSCOPY N/A 09/27/2012  Procedure: COLONOSCOPY;  Surgeon: Lafayette Dragon, MD;  Location: [facility];  Service: Endoscopy;  Laterality: N/A; . ESOPHAGOGASTRODUODENOSCOPY N/A 09/27/2012  Procedure: ESOPHAGOGASTRODUODENOSCOPY (EGD);  Surgeon: Lafayette Dragon, MD;  Location: [facility];  Service: Endoscopy;  Laterality: N/A; . KNEE ARTHROSCOPY W/ MENISCAL REPAIR Left 2009 . LUMBAR DISC SURGERY   . LYMPH NODE DISSECTION Right 1970's  "neck; dr thought I had Hodgkins; turned out to be sarcoidosis" (11/15/2012) . squamous papilloma   2010  removed by Dr. Constance Holster ENT, noted on tongue . TONSILLECTOMY   HPI: Pt is a 67 y.o. M with significant PMH of multiple comorbidities including atrial fibrillation, diastolic dysfunction CHF, COPD, conversion disorder, asthma, right heart failure, chronic lower extremity stasis dermatitis. Previous hospitalization  for PE, now admited s/p fall with prolonged down time, encephalitis. 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 Pt was transferred to ICU due to respiratory deficits and concern for aspiration.  BSE completed with recommendation for MBS to allow instrumental swallow evaluation.   Subjective: pt awake in flouro chair Assessment / Plan / Recommendation CHL IP CLINICAL IMPRESSIONS 11/09/2017 Clinical Impression Patient presents with mild oral and moderate pharyngeal dysphagia with sensorimotor deficits.  He did not aspirate despite sequential swallows of thin and nectar.  Consistent laryngeal penetration of thin noted (x1 to vocal folds) that did not clear with cued throat clearing/cough.  Penetration occured due to decreased laryngeal closure, shallow pyriform sinuses allowing dumping into larynx before swallow.  Tongue base retraction impaired allowing residuals mostly in vallecular region with solids = liquids aid clearance.  Pt did cough and expectorate large bolus of viscous secretions during MBS with verbal cueing.  Secretion retention noted in pharynx which likely contributes to pt's dysphagia.  Given pt positioning is  an issue, would recommend conservative diet of dys2/nectar = allowing tsps thin and ice chips with strict precautions. Will follow up for dysphagia management, treatment.  Hopeful for improvement with pt becoming stronger.  Would recommend consider palliative consult given recurrent admissions to the hospital in the last six months and current medical status.  Thanks for allowing me to help with this pt's care plan.   SLP Visit Diagnosis Dysphagia, oropharyngeal phase (R13.12) Attention and concentration deficit following -- Frontal lobe and executive function deficit following -- Impact on safety and function Moderate aspiration risk   CHL IP TREATMENT RECOMMENDATION 11/09/2017 Treatment Recommendations Therapy as outlined in treatment plan below   Prognosis 11/09/2017  Prognosis for Safe Diet Advancement Fair Barriers to Reach Goals Other (Comment) Barriers/Prognosis Comment -- CHL IP DIET RECOMMENDATION 11/09/2017 SLP Diet Recommendations Dysphagia 2 (Fine chop) solids;Nectar thick liquid;Ice chips PRN after oral care Liquid Administration via Straw;Cup Medication Administration Whole meds with puree Compensations Slow rate;Small sips/bites;Follow solids with liquid;Other (Comment) Postural Changes --   CHL IP OTHER RECOMMENDATIONS 11/09/2017 Recommended Consults -- Oral Care Recommendations Oral care QID Other Recommendations Order thickener from pharmacy;Have oral suction available   CHL IP FOLLOW UP RECOMMENDATIONS 11/08/2017 Follow up Recommendations Skilled Nursing facility   [facility] IP FREQUENCY AND DURATION 11/09/2017 Speech Therapy Frequency (ACUTE ONLY) min 2x/week Treatment Duration 1 week      CHL IP ORAL PHASE 11/09/2017 Oral Phase Impaired Oral - Pudding Teaspoon -- Oral - Pudding Cup -- Oral - Honey Teaspoon -- Oral - Honey Cup -- Oral - Nectar Teaspoon Premature spillage Oral - Nectar Cup -- Oral - Nectar Straw Premature spillage Oral - Thin Teaspoon Premature spillage Oral - Thin Cup Premature spillage Oral - Thin Straw Premature spillage Oral - Puree Premature spillage Oral - Mech Soft Premature spillage;Impaired mastication;Delayed oral transit Oral - Regular -- Oral - Multi-Consistency -- Oral - Pill WFL Oral Phase - Comment --  CHL IP PHARYNGEAL PHASE 11/09/2017 Pharyngeal Phase Impaired Pharyngeal- Pudding Teaspoon -- Pharyngeal -- Pharyngeal- Pudding Cup -- Pharyngeal -- Pharyngeal- Honey Teaspoon -- Pharyngeal -- Pharyngeal- Honey Cup -- Pharyngeal -- Pharyngeal- Nectar Teaspoon Delayed swallow initiation-pyriform sinuses;Reduced laryngeal elevation;Reduced airway/laryngeal closure Pharyngeal -- Pharyngeal- Nectar Cup -- Pharyngeal -- Pharyngeal- Nectar Straw Delayed swallow initiation-pyriform sinuses;Penetration/Aspiration before swallow;Penetration/Aspiration  during swallow;Reduced laryngeal elevation;Reduced airway/laryngeal closure;Reduced tongue base retraction Pharyngeal Material enters airway, remains ABOVE vocal cords and not ejected out Pharyngeal- Thin Teaspoon Delayed swallow initiation-pyriform sinuses;Reduced airway/laryngeal closure;Reduced laryngeal elevation Pharyngeal -- Pharyngeal- Thin Cup Delayed swallow initiation-pyriform sinuses;Reduced laryngeal elevation;Reduced airway/laryngeal closure;Penetration/Aspiration before swallow;Penetration/Aspiration during swallow;Reduced tongue base retraction Pharyngeal Material enters airway, CONTACTS cords and then ejected out Pharyngeal- Thin Straw Delayed swallow initiation-pyriform sinuses;Reduced tongue base retraction;Reduced airway/laryngeal closure;Reduced laryngeal elevation;Penetration/Aspiration before swallow;Penetration/Aspiration during swallow Pharyngeal Material enters airway, remains ABOVE vocal cords and not ejected out Pharyngeal- Puree Delayed swallow initiation-vallecula;Pharyngeal residue - valleculae;Reduced tongue base retraction Pharyngeal -- Pharyngeal- Mechanical Soft Delayed swallow initiation-vallecula;Reduced tongue base retraction;Pharyngeal residue - valleculae Pharyngeal -- Pharyngeal- Regular -- Pharyngeal -- Pharyngeal- Multi-consistency -- Pharyngeal -- Pharyngeal- Pill WFL;Pharyngeal residue - valleculae Pharyngeal -- Pharyngeal Comment chin tuck not consistently performed despite total cues, dry swallows difficult for pt to perform, following solids with liquids helpful to decrease residuals, pt did not aspirate despite consumption of liquids sequentially, trace penetration to vocal folds x1 without sensation, cued cough and throat clear did not clear penetrates, pt with shallow pyriform sinus allowing penetration   CHL IP CERVICAL ESOPHAGEAL PHASE 11/09/2017 Cervical Esophageal Phase (No  Data) Pudding Teaspoon -- Pudding Cup -- Honey Teaspoon -- Honey Cup -- Nectar Teaspoon --  Nectar Cup -- Nectar Straw -- Thin Teaspoon -- Thin Cup -- Thin Straw -- Puree -- Mechanical Soft -- Regular -- Multi-consistency -- Pill -- Cervical Esophageal Comment -- Macario Golds 11/09/2017, 9:43 AM  Luanna Salk, MS [facility] SLP Acute Rehab Services Pager [phone] Office [phone]             [place] Ekg Site Rite  Result Date: 10/15/2017 If Site Rite image not attached, placement could not be confirmed due to current cardiac rhythm.  EEG -Normal electroencephalogram, awakeand drowsy. There are no focal lateralizing or epileptiform features  ECHOCARDIOGRAM ------------------------------------------------------------------- Study Conclusions  - Left ventricle: The cavity size was normal. Wall thickness was normal. Systolic function was normal. The estimated ejection fraction was in the range of 55% to 60%. Wall motion was normal; there were no regional wall motion abnormalities. - Aortic valve: There was mild stenosis. Valve area (VTI): 1.44 cm^2. Valve area (Vmax): 1.44 cm^2. Valve area (Vmean): 1.49 cm^2. - Right ventricle: The cavity size was mildly dilated. - Pulmonary arteries: Systolic pressure was moderately increased. PA peak pressure: 51 mm Hg (S).  Subjective: And examined at bedside and was alert and oriented however was still having some tremor activity in the upper arms.  Discussed with him about going to skilled nursing facility and he refused as it would cost him money and had to pay out of pocket.  No chest pain, lightheadedness or dizziness.  Patient states he slept well.  States that he has help at home and will want to go home rather than going to a skilled nursing facility.  Denies any other complaints or concerns and is improved respiratory wise however still requiring oxygen.  Discharge Exam: Vitals:   11/09/17 2146 11/10/17 0502  BP: 110/62 102/64  Pulse: (!) 55 64  Resp: 14 16  Temp: 98.1 F (36.7 C) 97.8 F (36.6 C)  SpO2: 96% 96%    Vitals:   11/09/17 1622 11/09/17 2009 11/09/17 2146 11/10/17 0502  BP: 101/61  110/62 102/64  Pulse: 61  (!) 55 64  Resp: 16  14 16   Temp: 98 F (36.7 C)  98.1 F (36.7 C) 97.8 F (36.6 C)  TempSrc: Oral  Oral Oral  SpO2: 91% 96% 96% 96%  Weight: 95.9 kg     Height: 5\' 3"  (1.6 m)      General: Pt is alert, awake, not in acute distress Cardiovascular: RRR, S1/S2 +, no rubs, no gallops Respiratory: Diminished bilaterally, no wheezing, no rhonchi; Supplemental O2 removed but patient desaturated to 82% Abdominal: Soft, NT, Distended due to body habitus, bowel sounds + Extremities: Left Upper Arm Edema improved, no cyanosis  The results of significant diagnostics from this hospitalization (including imaging, microbiology, ancillary and laboratory) are listed below for reference.    Microbiology: Recent Results (from the past 240 hour(s))  MRSA PCR Screening     Status: None   Collection Time: 11/07/17 10:28 AM  Result Value Ref Range Status   MRSA by PCR NEGATIVE NEGATIVE Final    Comment:        The GeneXpert MRSA Assay (FDA approved for NASAL specimens only), is one component of a comprehensive MRSA colonization surveillance program. It is not intended to diagnose MRSA infection nor to guide or monitor treatment for MRSA infections. Performed at [facility], [place] [address], [place], [place] [address]   Culture, blood (x  2)     Status: None (Preliminary result)   Collection Time: 11/07/17 12:07 PM  Result Value Ref Range Status   Specimen Description   Final    BLOOD RIGHT HAND Performed at [place] [address] [place], [place] [address]    Special Requests   Final    BOTTLES DRAWN AEROBIC AND ANAEROBIC Blood Culture adequate volume Performed at [place] [address] [place], [place] [address]    Culture   Final    NO GROWTH 3 DAYS Performed at [facility], [place] [address]  [place], [place] [address]    Report Status PENDING  Incomplete  Culture, blood (x 2)     Status: None (Preliminary result)   Collection Time: 11/07/17 12:09 PM  Result Value Ref Range Status   Specimen Description   Final    BLOOD LEFT HAND Performed at [place] [address], [place], [place] [address]    Special Requests   Final    BOTTLES DRAWN AEROBIC AND ANAEROBIC Blood Culture adequate volume Performed at [place] [address] [place], [place] [address]    Culture   Final    NO GROWTH 3 DAYS Performed at [facility], [place] [address] [place], [place] [address]    Report Status PENDING  Incomplete    Labs: BNP (last 3 results) Recent Labs    09/03/17 1901 09/17/17 1201 10/11/17 1454  BNP 36.1 33.3 37.9   Basic Metabolic Panel: Recent Labs  Lab 11/07/17 0940 11/08/17 0305 11/09/17 0806 11/10/17 0417  NA 143 145 145 142  K 4.0 3.9 3.7 4.1  CL 103 105 106 105  CO2 30 32 32 31  GLUCOSE 114* 85 79 112*  BUN 16 15 16 14   CREATININE 0.85 0.69 0.67 0.60*  CALCIUM 9.2 8.8* 8.5* 8.4*  MG  --   --  2.1 2.0  PHOS  --   --  3.2 3.3   Liver Function Tests: Recent Labs  Lab 11/09/17 0806 11/10/17 0417  AST 13* 24  ALT 21 29  ALKPHOS 55 55  BILITOT 0.7 0.6  PROT 5.2* 5.0*  ALBUMIN 2.1* 2.0*   No results for input(s): LIPASE, AMYLASE in the last 168 hours. No results for input(s): AMMONIA in the last 168 hours. CBC: Recent Labs  Lab 11/07/17 0940 11/08/17 0305 11/09/17 0806 11/10/17 0417  WBC 9.8 9.1 6.5 5.1  NEUTROABS  --   --  4.0 2.6  HGB 12.6* 11.1* 10.8* 10.6*  HCT 39.4 35.4* 34.6* 34.3*  MCV 92.1 93.7 94.5 96.1  PLT 292 253 208 223   Cardiac Enzymes: No results for input(s): CKTOTAL, CKMB, CKMBINDEX, TROPONINI in the last 168 hours. BNP: Invalid input(s): POCBNP CBG: No results for input(s): GLUCAP in the last 168 hours. D-Dimer No results for input(s): DDIMER in the last 72 hours. Hgb  A1c No results for input(s): HGBA1C in the last 72 hours. Lipid Profile No results for input(s): CHOL, HDL, LDLCALC, TRIG, CHOLHDL, LDLDIRECT in the last 72 hours. Thyroid function studies No results for input(s): TSH, T4TOTAL, T3FREE, THYROIDAB in the last 72 hours.  Invalid input(s): FREET3 Anemia work up No results for input(s): VITAMINB12, FOLATE, FERRITIN, TIBC, IRON, RETICCTPCT in the last 72 hours. Urinalysis    Component Value Date/Time   COLORURINE YELLOW 10/31/2017 1524   APPEARANCEUR CLEAR 10/31/2017 1524   LABSPEC 1.023 10/31/2017 1524   PHURINE 5.0 10/31/2017  [place] 10/31/2017 1524   HGBUR SMALL (A) 10/31/2017 1524   HGBUR negative 12/11/2009 1356   BILIRUBINUR NEGATIVE 10/31/2017 1524   BILIRUBINUR MODERATE 06/05/2015 1407   KETONESUR 80 (A) 10/31/2017 1524   PROTEINUR 100 (A) 10/31/2017 1524   UROBILINOGEN 2.0 06/05/2015 1407   UROBILINOGEN 1.0 12/30/2014 1830   NITRITE NEGATIVE 10/31/2017 1524   LEUKOCYTESUR NEGATIVE 10/31/2017 1524   Sepsis Labs Invalid input(s): PROCALCITONIN,  WBC,  LACTICIDVEN Microbiology Recent Results (from the past 240 hour(s))  MRSA PCR Screening     Status: None   Collection Time: 11/07/17 10:28 AM  Result Value Ref Range Status   MRSA by PCR NEGATIVE NEGATIVE Final    Comment:        The GeneXpert MRSA Assay (FDA approved for NASAL specimens only), is one component of a comprehensive MRSA colonization surveillance program. It is not intended to diagnose MRSA infection nor to guide or monitor treatment for MRSA infections. Performed at [facility], [place] [address] [place], [place] [address]   Culture, blood (x 2)     Status: None (Preliminary result)   Collection Time: 11/07/17 12:07 PM  Result Value Ref Range Status   Specimen Description   Final    BLOOD RIGHT HAND Performed at [place] [address] [place], [place] [address]    Special Requests   Final     BOTTLES DRAWN AEROBIC AND ANAEROBIC Blood Culture adequate volume Performed at [place] [address], [place], [place] [address]    Culture   Final    NO GROWTH 3 DAYS Performed at [facility], [name] [address] [place], [place] [address]    Report Status PENDING  Incomplete  Culture, blood (x 2)     Status: None (Preliminary result)   Collection Time: 11/07/17 12:09 PM  Result Value Ref Range Status   Specimen Description   Final    BLOOD LEFT HAND Performed at [place] [address], [place], [place] [address]    Special Requests   Final    BOTTLES DRAWN AEROBIC AND ANAEROBIC Blood Culture adequate volume Performed at [place] [address], [place], [place] [address]    Culture   Final    NO GROWTH 3 DAYS Performed at [facility], [place] [address], [place],  [address]    Report Status PENDING  Incomplete   Time coordinating discharge: 35 minutes  SIGNED:  Kerney Elbe, DO [facility] 11/10/2017, 3:19 PM Pager is on [place]  If 7PM-7AM, please contact night-coverage [email or web address] Password [email or web address]

## 2017-11-10 NOTE — Progress Notes (Signed)
Occupational Therapy Treatment Patient Details Name: Patrick Holt MRN: [id number] DOB: 13-Mar-1950 Today's Date: 11/10/2017    History of present illness Pt is a 67 y.o. M with significant PMH of multiple comorbidities including atrial fibrillation, diastolic dysfunction CHF, COPD, conversion disorder, asthma, right heart failure, chronic lower extremity stasis dermatitis. Previous hospitalization for PE, now admited s/p fall with prolonged down time. Imaging showed small infarct L parietal/temporal junction.   OT comments  Pt is making some progress towards stated goals,appears self limiting. Per chart review pt is refusing SNF and going home with home care. OT still recommend SNF is appropriate level of care at this time. Pt deferring OOB activity, stating "people don't know what they're doing" when they get him out of bed. AROM RUE ceiling punches x10 completed with minimal effort. Pt loudly vocalizes when LUE was lightly touched during start of session, pt not yelling with pain/making facial expressions when OT repositioning arm on pillow x2 (noted history of conversion disorder). Attempted to practice rolling, needing max A x2. Encouraged pt to initiate movement with several cues, pt stating "everything hurts", noting some flexion of muscles but no notable effort. Educated pt on positioning of LUE with pillows. Pt asking OT to complete simple tasks, encouraged independence. BUE tremors noted with intention. Will continue to follow pt acutely.  Follow Up Recommendations  SNF;Other (comment)(pt is going home with home care)    Equipment Recommendations  3 in 1 bedside commode;Tub/shower seat;Hospital bed    Recommendations for Other Services      Precautions / Restrictions Precautions Precautions: Fall Precaution Comments: multiple falls; L arm/shoulder very painful  Restrictions Weight Bearing Restrictions: No       Mobility Bed Mobility Overal bed mobility: Needs Assistance Bed  Mobility: Rolling Rolling: Max assist;+2 for physical assistance;+2 for safety/equipment   Supine to sit: Mod assist;+2 for physical assistance;HOB elevated Sit to supine: Max assist;+2 for physical assistance;+2 for safety/equipment;HOB elevated   General bed mobility comments: Rolling per pt request of pain in buttocks. Encouraged pt with increased time to initiate roll to both sides, pt saying he is trying but no significant movement noted. Pt also c/o pain when rolling. Pt needing A to lift legs to remove/place pillow under calves  Transfers Overall transfer level: Needs assistance Equipment used: Rolling walker (2 wheeled) Transfers: Sit to/from Stand Sit to Stand: Max assist;+2 physical assistance;+2 safety/equipment;From elevated surface         General transfer comment: attempted sit to stand x3, pt unable to initiate hip extension and PT unable to facilitate gluteals enough for pt to achieve full upright standing. Bilat LE blocking required, as bilat LEs sliding forward without assist. Pt unable to maintain LUE on RW. Pt was disappointed that he was unable to stand today. Rests between each sit to stand attempt.     Balance Overall balance assessment: Needs assistance Sitting-balance support: Feet supported;Single extremity supported Sitting balance-Leahy Scale: Poor Sitting balance - Comments: EOB sitting approximately 15 minutes total, pt encouraged to lean trunk forward as pt in posterior lean. Pt also encouraged to center head, as pt with R cervical lateral flexion and rotation.  Postural control: Posterior lean Standing balance support: Bilateral upper extremity supported Standing balance-Leahy Scale: Poor Standing balance comment: relies on BUE support, PT support                            ADL either performed or assessed with clinical judgement  ADL Overall ADL's : Needs assistance/impaired Eating/Feeding: Maximal assistance;Bed level Eating/Feeding  Details (indicate cue type and reason): to eat jello from spoon Grooming: Maximal assistance Grooming Details (indicate cue type and reason): to wipe mouth after eating                               General ADL Comments: Focused session on rolling and exercise, pt deferring any ADL/OOB activity. Pt appears self limiting in ability to perform basic tasks. 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 Appears mostly unaware of deficits/safety awareness        Exercises Shoulder Exercises Shoulder Flexion: AROM;Right;10 reps;Supine Shoulder ABduction: AAROM;Right;10 reps   Shoulder Instructions       General Comments pt with tendency to position self in R lateral neck flexion, gaze shifted up to R when not cued    Pertinent Vitals/ Pain       Pain Assessment: Faces Pain Score: 2  Faces Pain Scale: Hurts little more Pain Location: buttocks from needing to be repositioned, LUE Pain Descriptors / Indicators: Aching;Guarding;Grimacing Pain Intervention(s): Limited activity within patient's tolerance;Monitored during session;Repositioned  Home Living                                          Prior Functioning/Environment              Frequency  Min 2X/week        Progress Toward Goals  OT  Goals(current goals can now be found in the care plan section)  Progress towards OT goals: Progressing toward goals  Acute Rehab OT Goals Patient Stated Goal: none stated OT Goal Formulation: With patient Time For Goal Achievement: 11/18/17 Potential to Achieve Goals: [place] Discharge plan needs to be updated    Co-evaluation                 AM-PAC PT "6 Clicks" Daily Activity     Outcome Measure   Help from another person eating meals?: A Lot Help from another person taking care of personal grooming?: A Lot Help from another person toileting, which includes using toliet, bedpan, or urinal?: Total Help from another person bathing (including washing, rinsing, drying)?: A Lot Help from another person to put on and taking off regular upper body clothing?: A Lot Help from another person to put on and taking off regular lower body clothing?: Total 6 Click Score: 10    End of Session    OT Visit Diagnosis: Muscle weakness (generalized) (M62.81);Hemiplegia and hemiparesis Hemiplegia - Right/Left: Left Hemiplegia - dominant/non-dominant: Non-Dominant Hemiplegia - caused by: Cerebral infarction   Activity Tolerance Patient limited by pain   Patient Left  in bed;with call bell/phone within reach   Nurse Communication          Time: 5537-4827 OT Time Calculation (min): 37 min  Charges: OT General Charges $OT Visit: 1 Visit OT Treatments $Self Care/Home Management : 23-37 mins  Zenovia Jarred, MSOT, OTR/L Behavioral Health OT/ Acute Relief OT  Zenovia Jarred 11/10/2017, 5:18 PM

## 2017-11-10 NOTE — Progress Notes (Signed)
SATURATION QUALIFICATIONS: (This note is used to comply with regulatory documentation for home oxygen)  Patient Saturations on Room Air at Rest = 80%  Patient Saturations on Room Air while sitting up in bed = 78%  Patient Saturations on 2 Liters of oxygen while sitting up = 92%  Please briefly explain why patient needs home oxygen:

## 2017-11-10 NOTE — Progress Notes (Signed)
CSW spoke with patient about discharge options. Patient has been accepted at [facility] for short term rehab. Patient would be billed a co-pay for days spent in SNF while Medicaid application is pending. DSS and family working on patient's LTC Medicaid application.   Patient is unwilling to pay Medicare co-pay for SNF placement. Patient is medically stable to discharge. Patient prefers to discharge home, wait for LTC Medicaid approval, and go to LTC from home. MD aware. CSW alerted RNCM to assist with [facility] options.  CSW will follow up with patient's DSS worker Yancey Flemings: [phone]) about patient's decision to discharge home.   Pricilla Holm, MSW, [place] Social Work [phone]

## 2017-11-10 NOTE — Care Management Note (Signed)
Case Management Note  Patient Details  Name: Patrick Holt MRN: [id number] Date of Birth: 03/08/1950  Subjective/Objective:                  Transportation home  Action/Plan: Transportation papers completed and ptar called at 1502.  Expected Discharge Date:  11/04/17               Expected Discharge Plan:     In-House Referral:     Discharge planning Services  CM Consult  Post Acute Care Choice:    Choice offered to:     DME Arranged:    DME Agency:     HH Arranged:    HH Agency:     Status of Service:  In process, will continue to follow  If discussed at Long Length of Stay Meetings, dates discussed:    Additional Comments:  Leeroy Cha, RN 11/10/2017, 3:01 PM

## 2017-11-10 NOTE — Progress Notes (Addendum)
  Speech Language Pathology Treatment: Dysphagia  Patient Details Name: Patrick Holt MRN: [id number] DOB: 1950-04-06 Today's Date: 11/10/2017 Time: 6644-0347 SLP Time Calculation (min) (ACUTE ONLY): 25 min  Assessment / Plan / Recommendation Clinical Impression  SLP saw pt today to assure po tolerance, education re: his dysphagia and to clinical reasoning for precautions.  Student nurse in room feeding pt - providing pt thin Sprite via straw with HOB at approximately 45*.  Pt noted to cough significantly immediately post-swallow of thin - suspect aspiration!  SLP thickened soda for pt and provided to him via cup.  NO indication of airway compromise with nectar thick liquids.  Advised pt to increased aspiration pna risk with aspiration and weak cough.    Educated pt and Stage manager to clinical reasoning for swallow precautions/diet modifiers.  Encouraged her to follow precautions as this is a "written order".    Pt's cued cough was weak - encouraged pt to have follow up SLP to maximize his airway protection with po and his swallowing ability - to which he agreed!  Given pt h/o conversion disorder, suspect he may not follow precautions at home and suspect increased readmission rate if aspirating.  Intake documented as 50%, WBC 5.1 and pt is afebrile.    SLP placed pt's swallow precaution sign in his packet for PTAR.     HPI HPI: Pt is a 67 y.o. M with significant PMH of multiple comorbidities including atrial fibrillation, diastolic dysfunction CHF, COPD, conversion disorder, asthma, right heart failure, chronic lower extremity stasis dermatitis. Previous hospitalization for PE, now admited s/p fall with prolonged down time, encephalitis. 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 Pt was transferred to ICU due to respiratory deficits and concern for aspiration.  BSE completed with recommendation for MBS to allow instrumental swallow evaluation.        SLP Plan  Continue with current plan of care       Recommendations  Diet recommendations: Dysphagia 2 (fine chop);Nectar-thick liquid(tsps of thin ok) Liquids provided via: Teaspoon;Straw;Cup Medication Administration: Crushed with puree Supervision: Full supervision/cueing for compensatory strategies;Staff to assist with self feeding Compensations: Slow rate;Small sips/bites;Follow solids with liquid(tuck chin with liquids - straw, cough/expectorate if voice gurgly) Postural Changes and/or Swallow Maneuvers: Seated upright 90 degrees;Upright 30-60 min after meal                Oral Care Recommendations: Oral care before and after PO Follow up Recommendations: Home health SLP SLP Visit Diagnosis: Dysphagia, oropharyngeal phase (R13.12) Plan: Continue with current plan of care       GO                Macario Golds 11/10/2017, 4:01 PM Luanna Salk, [facility] SLP [place] Pager [phone] Office [phone]

## 2017-11-11 ENCOUNTER — Other Ambulatory Visit: Payer: Self-pay

## 2017-11-11 ENCOUNTER — Encounter (HOSPITAL_COMMUNITY): Payer: Self-pay | Admitting: Emergency Medicine

## 2017-11-11 ENCOUNTER — Emergency Department (HOSPITAL_COMMUNITY)
Admission: EM | Admit: 2017-11-11 | Discharge: 2017-11-11 | Disposition: A | Discharge status: Skilled Nursing Facility | Payer: Medicare Other | Attending: Emergency Medicine | Admitting: Emergency Medicine

## 2017-11-11 DIAGNOSIS — I10 Essential (primary) hypertension: Secondary | ICD-10-CM | POA: Diagnosis not present

## 2017-11-11 DIAGNOSIS — I4891 Unspecified atrial fibrillation: Secondary | ICD-10-CM | POA: Insufficient documentation

## 2017-11-11 DIAGNOSIS — M069 Rheumatoid arthritis, unspecified: Secondary | ICD-10-CM | POA: Diagnosis not present

## 2017-11-11 DIAGNOSIS — F17210 Nicotine dependence, cigarettes, uncomplicated: Secondary | ICD-10-CM | POA: Diagnosis not present

## 2017-11-11 DIAGNOSIS — E119 Type 2 diabetes mellitus without complications: Secondary | ICD-10-CM | POA: Insufficient documentation

## 2017-11-11 DIAGNOSIS — Z79899 Other long term (current) drug therapy: Secondary | ICD-10-CM | POA: Diagnosis not present

## 2017-11-11 DIAGNOSIS — Z7901 Long term (current) use of anticoagulants: Secondary | ICD-10-CM | POA: Diagnosis not present

## 2017-11-11 DIAGNOSIS — J449 Chronic obstructive pulmonary disease, unspecified: Secondary | ICD-10-CM | POA: Diagnosis not present

## 2017-11-11 DIAGNOSIS — F319 Bipolar disorder, unspecified: Secondary | ICD-10-CM | POA: Insufficient documentation

## 2017-11-11 DIAGNOSIS — Z022 Encounter for examination for admission to residential institution: Secondary | ICD-10-CM | POA: Diagnosis present

## 2017-11-11 DIAGNOSIS — Z86711 Personal history of pulmonary embolism: Secondary | ICD-10-CM | POA: Diagnosis not present

## 2017-11-11 DIAGNOSIS — Z09 Encounter for follow-up examination after completed treatment for conditions other than malignant neoplasm: Secondary | ICD-10-CM

## 2017-11-11 LAB — CBC
HEMATOCRIT: 37.5 % — AB (ref 39.0–52.0)
HEMOGLOBIN: 12 g/dL — AB (ref 13.0–17.0)
MCH: 29.8 pg (ref 26.0–34.0)
MCHC: 32 g/dL (ref 30.0–36.0)
MCV: 93.1 fL (ref 78.0–100.0)
Platelets: 239 10*3/uL (ref 150–400)
RBC: 4.03 MIL/uL — ABNORMAL LOW (ref 4.22–5.81)
RDW: 17.7 % — AB (ref 11.5–15.5)
WBC: 8.7 10*3/uL (ref 4.0–10.5)

## 2017-11-11 LAB — URINALYSIS, ROUTINE W REFLEX MICROSCOPIC
BILIRUBIN URINE: NEGATIVE
Glucose, UA: NEGATIVE mg/dL
HGB URINE DIPSTICK: NEGATIVE
KETONES UR: NEGATIVE mg/dL
Leukocytes, UA: NEGATIVE
Nitrite: NEGATIVE
PH: 8 (ref 5.0–8.0)
Protein, ur: NEGATIVE mg/dL
SPECIFIC GRAVITY, URINE: 1.015 (ref 1.005–1.030)

## 2017-11-11 LAB — BASIC METABOLIC PANEL
ANION GAP: 8 (ref 5–15)
BUN: 12 mg/dL (ref 8–23)
CALCIUM: 9 mg/dL (ref 8.9–10.3)
CO2: 31 mmol/L (ref 22–32)
Chloride: 104 mmol/L (ref 98–111)
Creatinine, Ser: 0.69 mg/dL (ref 0.61–1.24)
GFR calc Af Amer: >60 mL/min (ref >60)
GLUCOSE: 103 mg/dL — AB (ref 70–99)
POTASSIUM: 3.8 mmol/L (ref 3.5–5.1)
Sodium: 143 mmol/L (ref 135–145)

## 2017-11-11 LAB — CBG MONITORING, ED: Glucose-Capillary: 96 mg/dL (ref 70–99)

## 2017-11-11 NOTE — Discharge Instructions (Addendum)
Your blood work was all normal today.  [facility] has accepted you for placement. They will transport you there tonight.   Good luck and well wishes on a quick recovery!

## 2017-11-11 NOTE — ED Provider Notes (Signed)
[facility] DEPT Provider Note  CSN: [id number] Arrival date & time: 11/11/17  1239    History   Chief Complaint Chief Complaint  Patient presents with  . unable to care for himself  . SNF placement    HPI Patrick Holt is a 67 y.o. male with a complex medical history that includes of seizures, stroke, a-fib, COPD, cancer and more listed below who presented to the ED for SNF placement. Patient states that he needs a SNF because he needs a lot of assistance at home with his ADLs and managing his numerous medical conditions. He admits that he was recommended to go to SNF when discharged from the hospital yesterday 11/10/17, but states he refused to go to SNF because of cost. Patient has no specific physical complaints today. Denies fever, chills, new neuro complaints, SOB, chest pain, abdominal pain, N/V, bowel changes or urinary complaints.  Additional history obtained by medical chart. Discharge summary from 11/10/17 notes that patient was aware of the risks of going home instead of SNF and had the capacity to make this decision. Patient has DSS worker who is working with family and patient on [place] application.  Past Medical History:  Diagnosis Date  . Anemia   . Anxiety   . Asthma   . Atrial fibrillation ([place]well) 01/2012   with RVR  . Bipolar 1 disorder (Malabar)   . Cancer (Elliott)   . Collagen vascular disease ([place]ine Hill)   . Conversion disorder 06/2010  . COPD (chronic obstructive pulmonary disease) (Coalville)   . Dermatomyositis (Midwest City)   . Dermatomyositis (Aubrey)   . Diverticulitis   . Diverticulosis of colon   . Dysrhythmia    "irregular" (11/15/2012)  . Esophageal dysmotility   . Esophageal stricture   . Fibromyalgia   . Gastritis   . GERD (gastroesophageal reflux disease)   . Hand fracture, right    sept 13, 2017, still wearing splint as og 01-10-2017  . Headache(784.0)    "severe; get them often" (11/15/2012)  . Hiatal hernia   . History of narcotic  addiction (Salesville)   . Hx of adenomatous colonic polyps   . Hyperlipidemia   . Hypertension   . Internal hemorrhoids   . Ischemic heart disease   . Major depression    with acute psychotic break in 06/2010  . Myocardial infarct (Howardwick)    mulitple (1999, 2000, 2004)  . Narcotic dependence ([place])   . Nephrolithiasis   . Obesity   . OCD (obsessive compulsive disorder)   . Otosclerosis   . Paroxysmal A-fib (Glendale)   . Peripheral neuropathy   . Raynaud's disease   . Renal insufficiency   . Rheumatoid arthritis(714.0)   . Sarcoidosis   . Seizures (Olathe)   . Subarachnoid hemorrhage (Terryville) 01/2012   with subdural  hematoma.   . Type II diabetes mellitus (Tignall)   . Urge incontinence   . Vertigo     Patient Active Problem List   Diagnosis Date Noted  . Atrial fibrillation with RVR (Greenup)   . Aspiration pneumonia of both lungs due to gastric secretions (Chester)   . Fall 11/02/2017  . History of pulmonary embolism 10/31/2017  . Left-sided weakness 10/31/2017  . Anasarca 10/11/2017  . Pulmonary embolism (Anton) 10/11/2017  . ARF (acute renal failure) (Viera West) 09/17/2017  . Unstable angina (Triumph) 06/16/2017  . L1 and L2 vertebral fracture (Keams Canyon) 04/06/2017  . Multiple rib fractures 04/06/2017  . Evaluation by psychiatric service required 03/07/2017  .  Acute metabolic encephalopathy 02/17/7251  . Hypoglycemia 03/04/2017  . Acute respiratory failure with hypoxia (Eldon) 01-25-2018  . Acute encephalopathy 01-25-2018  . Rhabdomyolysis 02/05/2017  . Acute respiratory failure with hypoxia and hypercapnia (Edmund) 02/05/2017  . COPD with acute exacerbation (Seaford) 02/05/2017  . Bipolar 1 disorder (Toppenish) 02/05/2017  . Anemia 02/05/2017  . Hypertension 02/05/2017  . Hand fracture, right 02/05/2017  . Narcotic dependence (New Madrid) 02/05/2017  . Pre-diabetes 02/05/2017  . PAF (paroxysmal atrial fibrillation) (Stollings) 02/05/2017  . Frequent falls 02/05/2017  . History of dermatomyositis 02/05/2017  . Seizure disorder  (Vinton) 02/05/2017  . Bilateral lower extremity edema 02/05/2017  . Pulmonary artery hypertension (Little Chute) 01/15/2017  . Altered mental status 01/13/2017  . Low oxygen saturation   . Pain in right wrist 02/27/2016  . Altered mental state 11/14/2015  . Severe dehydration 11/14/2015  . Hypernatremia 11/14/2015  . Difficulty in walking, not elsewhere classified   . Right wrist fracture   . Closed displaced fracture of ulnar styloid with routine healing   . Scapholunate ligament injury with no instability   . Falls frequently   . Hypoxia 11/02/2015  . Anxiety 06/06/2015  . Impaired mobility 04/28/2015  . Lumbar spondylosis with myelopathy 03/07/2015  . Nocturia 12/03/2014  . Chronic venous insufficiency 09/23/2014  . Poor social situation 07/30/2014  . Schizophrenia, acute (Champion)   . Schizoaffective disorder, bipolar type (Eutaw)   . Decreased visual acuity 06/27/2014  . COPD exacerbation (Goodyear Village)   . Right sided weakness 01/04/2014  . Essential hypertension, benign   . Atrial fibrillation, unspecified   . Radiculopathy of cervical region   . Polyuria 12/07/2013  . Erectile dysfunction 12/07/2013  . Diverticulitis 08/01/2013  . Acute diverticulitis 08/01/2013  . Hematochezia 06/04/2013  . Dehydration 11/09/2012  . Hypokalemia 10/03/2012  . Benign neoplasm of colon 09/27/2012  . Stricture and stenosis of esophagus 09/27/2012  . Diverticulitis of colon without hemorrhage 07/26/2012  . Polypharmacy 03/26/2012  . Seizures (Mabie) 02/10/2012  . Atrial fibrillation (Highland Lakes) 02/05/2012  . Coronary atherosclerosis of native coronary artery 02/05/2012  . Radicular low back pain 03/09/2011  . History of seizure disorder 01/29/2011  . Irritable bowel syndrome (IBS) 06/24/2010  . Urge incontinence 06/19/2010  . Tobacco abuse 11/23/2008  . DEPRESSION, MAJOR 05/31/2008  . ISCHEMIC HEART DISEASE 05/31/2008  . RAYNAUD'S DISEASE 05/31/2008  . HIATAL HERNIA 05/31/2008  . Rheumatoid arthritis (Creekside)  05/31/2008  . FIBROMYALGIA 05/31/2008  . Hyperlipidemia 01/26/2008  . Anxiety state 01/26/2008  . HEMORRHOIDS, INTERNAL 01/26/2008  . COPD (chronic obstructive pulmonary disease) (McConnelsville) 01/26/2008  . ESOPHAGEAL MOTILITY DISORDER 01/26/2008  . GERD 01/26/2008  . Dermatomyositis (Veedersburg) 01/26/2008    Past Surgical History:  Procedure Laterality Date  . BACK SURGERY    . CARDIAC CATHETERIZATION    . CARPAL TUNNEL RELEASE Bilateral   . CATARACT EXTRACTION W/ INTRAOCULAR LENS IMPLANT Left   . COLONOSCOPY N/A 09/27/2012   Procedure: COLONOSCOPY;  Surgeon: Lafayette Dragon, MD;  Location: [facility] END[facility]COPY;  Service: Endoscopy;  Laterality: N/A;  . ESOPHAGOGASTRODUODENOSCOPY N/A 09/27/2012   Procedure: ESOPHAGOGASTRODUODENOSCOPY (EGD);  Surgeon: Lafayette Dragon, MD;  Location: [facility];  Service: Endoscopy;  Laterality: N/A;  . KNEE ARTHROSCOPY W/ MENISCAL REPAIR Left 2009  . LUMBAR DISC SURGERY    . LYMPH NODE DISSECTION Right 1970's   "neck; dr thought I had Hodgkins; turned out to be sarcoidosis" (11/15/2012)  . squamous papilloma   2010   removed by Dr. Constance Holster ENT, noted on tongue  .  TONSILLECTOMY          Home Medications    Prior to Admission medications   Medication Sig Start Date End Date Taking? Authorizing Provider  acetaminophen (TYLENOL) 325 MG tablet Take 2 tablets (650 mg total) by mouth every 6 (six) hours as needed for mild pain (or Fever >/= 101). 11/04/17   Debbe Odea, MD  albuterol (PROVENTIL) (2.5 MG/3ML) 0.083% nebulizer solution Take 3 mLs (2.5 mg total) by nebulization every 4 (four) hours as needed for wheezing or shortness of breath. 11/10/17   Sheikh, Omair Latif, DO  ALPRAZolam Duanne Moron) 0.5 MG tablet Take 1 tablet (0.5 mg total) by mouth 3 (three) times daily. 10/14/17   Elgergawy, Silver Huguenin, MD  amiodarone (PACERONE) 400 MG tablet Take 1 tablet (400 mg total) by mouth daily. 11/11/17   Raiford Noble Latif, DO  apixaban (ELIQUIS) 5 MG TABS tablet Take 10 mg oral twice  daily for total of  days, then transition to 5 mg oral twice daily on 10/21/2017 10/18/17   Elgergawy, Silver Huguenin, MD  atorvastatin (LIPITOR) 40 MG tablet Take 1 tablet (40 mg total) by mouth daily. 09/07/17   Desiree Hane, MD  benzonatate (TESSALON) 100 MG capsule Take 1 capsule (100 mg total) by mouth 3 (three) times daily. 11/10/17   Raiford Noble Latif, DO  budesonide-formoterol (SYMBICORT) 160-4.5 MCG/ACT inhaler Inhale 2 puffs into the lungs 2 (two) times daily. 09/07/17   Oretha Milch D, MD  cefdinir (OMNICEF) 300 MG capsule Take 1 capsule (300 mg total) by mouth every 12 (twelve) hours for 6 days. 11/10/17 11/16/17  Raiford Noble Latif, DO  divalproex (DEPAKOTE) 250 MG DR tablet Take 1 tablet (250 mg total) by mouth 3 (three) times daily. 09/07/17   Oretha Milch D, MD  ergocalciferol (VITAMIN D2) 50000 units capsule Take 1 capsule (50,000 Units total) by mouth once a week. 09/07/17   Desiree Hane, MD  esomeprazole (NEXIUM) 40 MG capsule Take 1 capsule (40 mg total) by mouth daily. 09/07/17   Desiree Hane, MD  fluconazole (DIFLUCAN) 100 MG tablet Take 1 tablet (100 mg total) by mouth daily. 11/11/17   Raiford Noble Latif, DO  guaiFENesin (MUCINEX) 600 MG 12 hr tablet Take 2 tablets (1,200 mg total) by mouth 2 (two) times daily. 10/18/17   Elgergawy, Silver Huguenin, MD  ipratropium-albuterol (DUONEB) 0.5-2.5 (3) MG/3ML SOLN Inhale 3 mLs into the lungs every 6 (six) hours as needed. Patient taking differently: Inhale 3 mLs into the lungs every 6 (six) hours as needed (wheezing).  09/07/17   Desiree Hane, MD  levETIRAcetam (KEPPRA) 500 MG tablet Take 1 tablet (500 mg total) by mouth 2 (two) times daily. 09/07/17   Oretha Milch D, MD  lidocaine (LIDODERM) 5 % Place 1 patch onto the skin daily. Remove & Discard patch within 12 hours or as directed by MD 11/11/17   Kerney Elbe, DO  loperamide (IMODIUM) 2 MG capsule Take 1 capsule (2 mg total) by mouth as needed for diarrhea or loose stools.  09/07/17   Desiree Hane, MD  Maltodextrin-Xanthan Gum (RESOURCE THICKENUP CLEAR) POWD Take 120 g by mouth as needed. 11/10/17   Raiford Noble Latif, DO  Multiple Vitamin (MULTIVITAMIN WITH MINERALS) TABS tablet Take 1 tablet by mouth daily. 09/07/17   Oretha Milch D, MD  nicotine (NICODERM CQ - DOSED IN MG/24 HOURS) 21 mg/24hr patch Place 1 patch (21 mg total) onto the skin daily. 10/15/17   Elgergawy, Silver Huguenin, MD  nutrition supplement, JUVEN, (JUVEN) PACK Take 1 packet by mouth 2 (two) times daily between meals. 11/04/17   Debbe Odea, MD  nystatin (MYCOSTATIN) 100000 UNIT/ML suspension Take 5 mLs (500,000 Units total) by mouth 4 (four) times daily. 11/10/17   Raiford Noble Latif, DO  oxybutynin (DITROPAN) 5 MG tablet Take 1 tablet (5 mg total) by mouth 3 (three) times daily. 09/07/17   Oretha Milch D, MD  Potassium Chloride ER 20 MEQ TBCR Take 20 mEq by mouth every other day. 09/20/17   Patrecia Pour, MD  predniSONE (DELTASONE) 10 MG tablet Take 0.5 tablets (5 mg total) by mouth daily with breakfast. 10/18/17   Elgergawy, Silver Huguenin, MD  risperiDONE (RISPERDAL) 1 MG tablet Take 1 tablet (1 mg total) by mouth 2 (two) times daily. 09/07/17   Desiree Hane, MD  senna-docusate (SENOKOT-S) 8.6-50 MG tablet Take 1 tablet by mouth at bedtime as needed for mild constipation. 09/07/17   Desiree Hane, MD    Family History Family History  Problem Relation Age of Onset  . Alcohol abuse Mother   . Depression Mother   . Heart disease Mother   . Diabetes Mother   . Stroke Mother   . Coronary artery disease Father   . Diabetes Other        1/2 brother  . Hepatitis Brother     Social History Social History   Tobacco Use  . Smoking status: Light Tobacco Smoker    Packs/day: 0.50    Years: 30.00    Pack years: 15.00    Types: Cigarettes    Last attempt to quit: 12/15/2014    Years since quitting: 2.9  . Smokeless tobacco: Never Used  . Tobacco comment: 1/2 cig BID  Substance Use Topics  .  Alcohol use: No    Alcohol/week: 0.0 standard drinks    Comment: Alcohol stopped in September of 2014  . Drug use: No     Allergies   Immune globulins; Rho (d) immune globulin; Ciprofloxacin; Flagyl [metronidazole]; Lisinopril; Sulfa antibiotics; Sulfasalazine; Cefepime; Morphine and related; Requip [ropinirole hcl]; Vancomycin; Betamethasone dipropionate; Betamethasone dipropionate; Bupropion; Bupropion hcl; Clobetasol; Codeine; Escitalopram oxalate; Escitalopram oxalate; Fluoxetine hcl; Fluoxetine hcl; Furosemide; Meclizine; Paroxetine; Penicillins; Tacrolimus; Tetanus toxoid; and Tuberculin purified protein derivative   Review of Systems Review of Systems  Constitutional: Negative.   HENT: Negative.   Respiratory: Negative.   Cardiovascular: Negative.   Gastrointestinal: Negative.   Genitourinary: Negative.   Musculoskeletal: Positive for arthralgias.  Skin: Negative.   Neurological: Positive for weakness. Negative for numbness.  Hematological: Bruises/bleeds easily.   Physical Exam Updated Vital Signs BP 124/80 (BP Location: Right Arm)   Pulse 70   Temp 97.9 F (36.6 C) (Oral)   Resp 18   SpO2 100%   Physical Exam  Constitutional: Vital signs are normal. He is cooperative. Nasal cannula in place.  Obese. Chronic ill appearance.  HENT:  Head: Normocephalic and atraumatic.  Cardiovascular: Normal rate, regular rhythm and normal heart sounds.  No murmur heard. Pulmonary/Chest: Effort normal. No tachypnea. No respiratory distress. He has decreased breath sounds.  Abdominal: Soft. Normal appearance and bowel sounds are normal. There is no tenderness.  Musculoskeletal:  Edema in left upper and lower extremity. 2+ pitting edema to left mid-shin.  Neurological: He is alert. He displays tremor. No cranial nerve deficit or sensory deficit. He exhibits abnormal muscle tone.  Left side weakness and paraplegia due to stroke. Bilateral tremor in hands.  Skin: Skin is warm.  Capillary refill takes 2 to 3 seconds. No pallor.  Erythema on lower legs bilaterally from chronic circulatory issues.  Psychiatric: His speech is normal and behavior is normal. Thought content normal. Cognition and memory are normal.  Nursing note and vitals reviewed.  ED Treatments / Results  Labs (all labs ordered are listed, but only abnormal results are displayed) Labs Reviewed  BASIC METABOLIC PANEL - Abnormal; Notable for the following components:      Result Value   Glucose, Bld 103 (*)    All other components within normal limits  CBC - Abnormal; Notable for the following components:   RBC 4.03 (*)    Hemoglobin 12.0 (*)    HCT 37.5 (*)    RDW 17.7 (*)    All other components within normal limits  URINALYSIS, ROUTINE W REFLEX MICROSCOPIC  CBG MONITORING, ED    EKG None  Radiology Dg Chest Port 1 View  Result Date: 11/10/2017 CLINICAL DATA:  Shortness of Breath EXAM: PORTABLE CHEST 1 VIEW COMPARISON:  11/07/2017 FINDINGS: Cardiac shadow is enlarged in size but accentuated by the portable technique. Aortic calcifications are again noted. Bibasilar infiltrative changes and small effusions are noted. These have increased slightly in the interval from the prior exam. No pneumothorax is noted. No bony abnormality is noted. IMPRESSION: Slight increase in bibasilar infiltrates and effusions. Electronically Signed   By: Inez Catalina M.D.   On: 11/10/2017 09:22    Procedures Procedures (including critical care time)  Medications Ordered in ED Medications - No data to display   Initial Impression / Assessment and Plan / ED Course  Triage vital signs and the nursing notes have been reviewed.  Pertinent labs & imaging results that were available during care of the patient were reviewed and considered in medical decision making (see chart for details).  Patient presents to the ED requesting SNF placement. Patient discharged from the hospital yesterday 11/10/17 where SNF was  recommended given his current physical state, but patient refused. He has no specific physical complaints today and physical exam unchanged from his physical at hospital discharge yesterday. Clinical Course as of Nov 11 1832  Fri Nov 11, 2017  1405 Case discussed with case management. Patient accepted at [facility] prior to hospital discharge, but patient refused to go. Family and pt's DSS worker is working on [employer]. Patient has arranged home health assistance. DSS worker or hospital social will call this provider back to discuss options for this patient.   [GM]  5427 All labs unremarkable. From a medical perspective, patient is stable.   [GM]  1711 Have not heard from pt's DSS worker. Patient has been moved to TCU until he can talk with PM Education officer, museum.   [GM]  1831 Spoke with evening Education officer, museum. [facility] will accept patient tonight and he will be transported there from the ED.   [GM]    Clinical Course User Index [GM] Mortis, Jonelle Sports, PA-C    Final Clinical Impressions(s) / ED Diagnoses   Dispo: Home. After thorough clinical evaluation, this patient is determined to be medically stable and can be safely discharged with the previously mentioned treatment and/or outpatient follow-up/referral(s). At this time, there are no other apparent medical conditions that require further screening, evaluation or treatment.   Final diagnoses:  Need for case management follow-up    ED Discharge Orders    None        Junita Push 11/11/17 Velta Addison    Virgel Manifold, MD 11/12/17 [phone]

## 2017-11-11 NOTE — Progress Notes (Signed)
CSCSW met with RN CM who stated pt had returned home from a SNF stay after refusing to pay co-pay after the pt's SNF stay days fully paid for by [facility] Medicare were all used up.  CSW met with pt who confirmed he had been mistaken he could remain at home by himself and is now stating he wants SNF placement and is willing to pay co-pay days.  CSW stated to pt he was to originally go to [facility] after staying at [facility] and CSW was told by the CSW's on the medical floor pt was "not allowed back" at [facility].  Pt stated he wanted to go to a SNF and would go to [place], but prefers to go to [facility]  CSW called [facility] and was referred by Gayla Medicus to the on-call admissions person, CSW called that person who did not call back.  CSW called Chantelle at [facility] who stated they could take the pt tonight using the pt's D/C paperwork from yesterday and requested CSW fax the AVS.  CSW faxed this.    Chantelle stated pt would have to agree to pay $232 a day totalling $1,160 using a check and pt would be reimbursed everything over and above the pt's co-pay price once the insurance Josem Kaufmann is provided by [facility].  Pt was agreeable, CSW updated EDP and the RN and received accepting information from [facility].  CSW placed this information in a note and updated the RN.  CSW faxed the AVS to [facility] who confirmed [facility] received it.  CSW will continue to follow for D/C needs.  Alphonse Guild. Chrisandra Wiemers, LCSW, LCAS, CSI Clinical Social Worker Ph: [phone]

## 2017-11-11 NOTE — Progress Notes (Signed)
CSW received a call from [place] Pt has been accepted by: [facility] Number for report is: [phone] Pt's unit/room/bed number will be: 65 Accepting physician: SNF MD  Pt can arrive ASAP on 11/11/17  CSW will update RN and EDP.  Alphonse Guild. Demetrica Zipp, LCSW, LCAS, CSI Clinical Social Worker Ph: [phone]

## 2017-11-11 NOTE — Progress Notes (Addendum)
11/11/17  1845  Called report to [facility] report given to [facility].

## 2017-11-11 NOTE — Progress Notes (Signed)
11/11/17  [facility] [phone] waiting for transport. Pt #4 on list.

## 2017-11-11 NOTE — ED Notes (Signed)
Attempted IV, unable to get IV, blood work collected. PA aware.

## 2017-11-11 NOTE — Progress Notes (Addendum)
CSW verbally consulted by [facility] as pt was expected to discharge to [facility] on yesterday then pt refused as pt was unwilling to pay copay days at the facility. CSW aware that pt returned today as pt is unable to care for self and now possibly interested in facility placement. CSW aware that pt will have co-pay days per last CSW note on 11/10/17. CSW will continue to follow for further needs at this time and handoff to evening CSW as needed. CSW has left vociemail or pt's DSS worker Aurea Graff [phone] as well as reached out to [facility] admission regarding pt.   Patrick Holt, MSW, [facility] Clinical Social Worker [phone]

## 2017-11-11 NOTE — ED Notes (Signed)
Bed: LM78 Expected date:  Expected time:  Means of arrival:  Comments: 67 yo needs social work consult

## 2017-11-11 NOTE — Progress Notes (Addendum)
CSW received a call from [place] at [facility] that the pt's PASSR had expired and CSW attempted another PASSR.  Pt's "screen still running" and a nurse will review the screening form and take action", per the [facility] MUST website.  [place] notified and she is checking with management and the [facility] ED CSW is checking on options for the pt until the pt has a PASSR.  CSW called the RN CM covering for the [facility] ED who will place a RN CM consult for 9/28 if pt remains in the ED so that coordination can take place tomorrow (9/28) so that the pt can D/C home with the needed oxygen tanks if pt remains in the ED.  8:45 PM' CSW received a phone call from [facility] pt can D/C.  RN updated.  CSW will continue to follow for D/C needs.  Alphonse Guild. Goldman Birchall, LCSW, LCAS, CSI Clinical Social Worker Ph: [phone]

## 2017-11-11 NOTE — ED Triage Notes (Signed)
Per [facility] pt from home where he is unable to care for himself. Has home health aide for couple hours per week. That who is called EMS about patient not safe to leave home alone all weekend. Pt was recently discharged home after refusing SNF placement. Pt had stroke and has left side deficits. Pt reports that he is unable to walk now since stroke and terrible fall. Pt c/o chronic back pain at this time.

## 2017-11-11 NOTE — Progress Notes (Signed)
11/11/17  1500 Patient has a ziplock bag with a red case. Per pt in his red case there is his wallet, and checkbook. Ziplock bag in locker #28

## 2017-11-11 NOTE — Care Management Note (Signed)
Case Management Note  CM consulted for transition needs.  CM contacted Dorian Pod with [facility] to find out if they have had a chance to have contact with pt since D/C from hospital admission yesterday.  She advised they were planning to see him tomorrow.  Would like to be notified if transitioned home again.  CM received a call from Santiago Glad with [facility] who advised pt left yesterday with an O2 tank.  Once home, he was suppose to be able to let [facility] enter the home to deliver more O2 tanks.  [facility] went by multiple times without being able to deliver O2.  Needs to be notified again and coordination planned for O2 delivery if transitioned home again.  Updated CSW.  11/14/2017 On chart review and contact with [facility] SNF, pt D/C to their facility.  Updated [facility] and [facility].  Lajuan Godbee, Benjaman Lobe, RN 11/11/2017, 2:20 PM

## 2017-11-12 LAB — CULTURE, BLOOD (ROUTINE X 2)
CULTURE: NO GROWTH
Culture: NO GROWTH
Special Requests: ADEQUATE
Special Requests: ADEQUATE

## 2018-01-05 ENCOUNTER — Encounter: Payer: Self-pay | Admitting: Neurology

## 2018-01-05 ENCOUNTER — Telehealth: Payer: Self-pay | Admitting: Neurology

## 2018-01-05 ENCOUNTER — Ambulatory Visit (INDEPENDENT_AMBULATORY_CARE_PROVIDER_SITE_OTHER): Payer: Medicare Other | Admitting: Neurology

## 2018-01-05 VITALS — BP 112/68 | HR 62

## 2018-01-05 DIAGNOSIS — R269 Unspecified abnormalities of gait and mobility: Secondary | ICD-10-CM | POA: Diagnosis not present

## 2018-01-05 DIAGNOSIS — R32 Unspecified urinary incontinence: Secondary | ICD-10-CM | POA: Diagnosis not present

## 2018-01-05 DIAGNOSIS — G3281 Cerebellar ataxia in diseases classified elsewhere: Secondary | ICD-10-CM | POA: Diagnosis not present

## 2018-01-05 NOTE — Progress Notes (Signed)
PATIENT: Patrick Holt DOB: [birth year]  Chief Complaint  Patient presents with  . CVA    He is here to follow up from a recent stroke.  He is currently in rehab at [facility]. He has no family involved in his care.  . Tremors    Reports worsening right hand tremor that is causing him significant difficulty when eating.   Marland Kitchen PCP    Medicine, [facility] (referred from hospital)      HISTORICAL   Patrick Holt is a 67 years old male, seen in request by his primary care Medicine, [facility] for evaluation of stroke,gait abnormality, initial evaluation was on January 05, 2018.  I have reviewed and summarized the discharge summary on November 10, 2017, he had a past medical history of atrial fibrillation, congestive heart failure, COPD, conversion disorder, asthma, chronic lower extremity stasis dermatitis, presented to the emergency room with generalized weakness, fall, inability to ambulate,  I personally reviewed MRI of brain on November 01, 2017: 5 mm acute infarction affecting left subcortical, generalized atrophy, extensive supratentorium small vessel disease.  Echocardiogram on November 02, 2017: Showed no thrombosis, ejection fraction 55 to 60%.  Ultrasound of carotid artery showed less than 39% stenosis of bilateral internal carotid artery, bilateral vertebral arteries were anterograde flow.  Left upper extremity venous Doppler study showed no evidence of DVT. A1C 5.4, LDL 47,   Depakote level 10, keppra 8.3  UDS was positive for benzodiazepine, previously was also positive for barbiturate  EEG was normal on November 01, 2017.   X-ray of left shoulder only showed mild degenerative changes.  Laboratory evaluations, hemoglobin 12, no significant abnormality on BMP,  Patient is a poor historian, complains of bilateral hands tremor, left shoulder pain, gait abnormality, worsening urinary incontinence over the past few  months, he stated he has not been able to work over the past 20 years,   REVIEW OF SYSTEMS: Full 14 system review of systems performed and notable only for as above All other review of systems were negative.  ALLERGIES: Allergies  Allergen Reactions  . Immune Globulins Other (See Comments)    Acute renal failure  . Rho (D) Immune Globulin     Acute renal failure Acute renal failure  . Ciprofloxacin Swelling  . Flagyl [Metronidazole] Swelling  . Lisinopril Diarrhea  . Sulfa Antibiotics Other (See Comments)    blisters  . Sulfasalazine     blisters blisters  . Cefepime Other (See Comments)    Diffuse maculopapular rash when receiving both vanc and cefepime, timing unclear.  . Morphine And Related Other (See Comments)    Ineffective for pain management  . Requip [Ropinirole Hcl] Other (See Comments)    constipation  . Vancomycin     Diffuse maculopapular rash when receiving both vanc/cefepime.  Timing unclear.    . Betamethasone Dipropionate Other (See Comments)    Unknown  . Betamethasone Dipropionate     Unknown  . Bupropion     Unknown  . Bupropion Hcl Other (See Comments)    Unknown  . Clobetasol Other (See Comments)    Unknown Unknown  . Codeine Other (See Comments)    Does not help with pain  . Escitalopram Oxalate Other (See Comments)    Unknown  . Escitalopram Oxalate     Unknown  . Fluoxetine Hcl Other (See Comments)    Unknown  . Fluoxetine Hcl     Unknown  . Furosemide  Other (See Comments)    Unknown Unknown  . Meclizine Rash  . Paroxetine Other (See Comments)    Unknown  . Penicillins Other (See Comments)    Unknown reaction Has patient had a PCN reaction causing immediate rash, facial/tongue/throat swelling, SOB or lightheadedness with hypotension: unknown Has patient had a PCN reaction causing severe rash involving mucus membranes or skin necrosis: unknown Has patient had a PCN reaction that required hospitalization unknown Has patient had a  PCN reaction occurring within the last 10 years: unknown If all of the above answers are "NO", then may proceed with Cephalosporin use. Unknown Unknown reactio  . Tacrolimus Other (See Comments)    Unknown  . Tetanus Toxoid Other (See Comments)    Unknown  . Tuberculin Purified Protein Derivative Other (See Comments)    Unknown    HOME MEDICATIONS: Current Outpatient Medications  Medication Sig Dispense Refill  . acetaminophen (TYLENOL) 325 MG tablet Take 2 tablets (650 mg total) by mouth every 6 (six) hours as needed for mild pain (or Fever >/= 101).    Marland Kitchen albuterol (PROVENTIL) (2.5 MG/3ML) 0.083% nebulizer solution Take 3 mLs (2.5 mg total) by nebulization every 4 (four) hours as needed for wheezing or shortness of breath. 75 mL 12  . ALPRAZolam (XANAX) 0.5 MG tablet Take 1 tablet (0.5 mg total) by mouth 3 (three) times daily. 12 tablet 0  . amiodarone (PACERONE) 400 MG tablet Take 1 tablet (400 mg total) by mouth daily. 30 tablet 0  . apixaban (ELIQUIS) 5 MG TABS tablet Take 10 mg oral twice daily for total of  days, then transition to 5 mg oral twice daily on 10/21/2017 60 tablet   . atorvastatin (LIPITOR) 40 MG tablet Take 1 tablet (40 mg total) by mouth daily. 30 tablet 0  . benzonatate (TESSALON) 100 MG capsule Take 1 capsule (100 mg total) by mouth 3 (three) times daily. 20 capsule 0  . budesonide-formoterol (SYMBICORT) 160-4.5 MCG/ACT inhaler Inhale 2 puffs into the lungs 2 (two) times daily. 1 Inhaler 0  . divalproex (DEPAKOTE) 250 MG DR tablet Take 1 tablet (250 mg total) by mouth 3 (three) times daily.    . ergocalciferol (VITAMIN D2) 50000 units capsule Take 1 capsule (50,000 Units total) by mouth once a week.    . esomeprazole (NEXIUM) 40 MG capsule Take 1 capsule (40 mg total) by mouth daily.    . fluconazole (DIFLUCAN) 100 MG tablet Take 1 tablet (100 mg total) by mouth daily. 5 tablet 0  . guaiFENesin (MUCINEX) 600 MG 12 hr tablet Take 2 tablets (1,200 mg total) by mouth 2  (two) times daily.    Marland Kitchen ipratropium-albuterol (DUONEB) 0.5-2.5 (3) MG/3ML SOLN Inhale 3 mLs into the lungs every 6 (six) hours as needed. (Patient taking differently: Inhale 3 mLs into the lungs every 6 (six) hours as needed (wheezing). ) 360 mL   . levETIRAcetam (KEPPRA) 500 MG tablet Take 1 tablet (500 mg total) by mouth 2 (two) times daily.    Marland Kitchen lidocaine (LIDODERM) 5 % Place 1 patch onto the skin daily. Remove & Discard patch within 12 hours or as directed by MD 30 patch 0  . loperamide (IMODIUM) 2 MG capsule Take 1 capsule (2 mg total) by mouth as needed for diarrhea or loose stools. 30 capsule 0  . Maltodextrin-Xanthan Gum (RESOURCE THICKENUP CLEAR) POWD Take 120 g by mouth as needed. 1 Can 0  . Multiple Vitamin (MULTIVITAMIN WITH MINERALS) TABS tablet Take 1 tablet by mouth  daily. 90 tablet 3  . nicotine (NICODERM CQ - DOSED IN MG/24 HOURS) 21 mg/24hr patch Place 1 patch (21 mg total) onto the skin daily. 28 patch 0  . nutrition supplement, JUVEN, (JUVEN) PACK Take 1 packet by mouth 2 (two) times daily between meals.  0  . nystatin (MYCOSTATIN) 100000 UNIT/ML suspension Take 5 mLs (500,000 Units total) by mouth 4 (four) times daily. 60 mL 0  . oxybutynin (DITROPAN) 5 MG tablet Take 1 tablet (5 mg total) by mouth 3 (three) times daily.    . Potassium Chloride ER 20 MEQ TBCR Take 20 mEq by mouth every other day.    . predniSONE (DELTASONE) 10 MG tablet Take 0.5 tablets (5 mg total) by mouth daily with breakfast.    . risperiDONE (RISPERDAL) 1 MG tablet Take 1 tablet (1 mg total) by mouth 2 (two) times daily.    Marland Kitchen senna-docusate (SENOKOT-S) 8.6-50 MG tablet Take 1 tablet by mouth at bedtime as needed for mild constipation.     No current facility-administered medications for this visit.     PAST MEDICAL HISTORY: Past Medical History:  Diagnosis Date  . Anemia   . Anxiety   . Asthma   . Atrial fibrillation ([place]) 01/2012   with RVR  . Bipolar 1 disorder ([place])   . Cancer ([place])   .  Collagen vascular disease ([place])   . Conversion disorder 06/2010  . COPD (chronic obstructive pulmonary disease) ([place])   . CVA (cerebral vascular accident) ([place])   . Dermatomyositis ([place])   . Dermatomyositis ([place])   . Diverticulitis   . Diverticulosis of colon   . Dysrhythmia    "irregular" (11/15/2012)  . Esophageal dysmotility   . Esophageal stricture   . Fibromyalgia   . Gastritis   . GERD (gastroesophageal reflux disease)   . Hand fracture, right    sept 13, 2017, still wearing splint as og 01-10-2017  . Headache(784.0)    "severe; get them often" (11/15/2012)  . Hiatal hernia   . History of narcotic addiction ([place])   . Hx of adenomatous colonic polyps   . Hyperlipidemia   . Hypertension   . Internal hemorrhoids   . Ischemic heart disease   . Major depression    with acute psychotic break in 06/2010  . Myocardial infarct ([place])    mulitple (1999, 2000, 2004)  . Narcotic dependence ([place])   . Nephrolithiasis   . Obesity   . OCD (obsessive compulsive disorder)   . Otosclerosis   . Paroxysmal A-fib ([place])   . Peripheral neuropathy   . Raynaud's disease   . Renal insufficiency   . Rheumatoid arthritis(714.0)   . Sarcoidosis   . Seizures ([place])   . Subarachnoid hemorrhage ([place]) 01/2012   with subdural  hematoma.   . Tremor   . Type II diabetes mellitus ([place])   . Urge incontinence   . Vertigo     PAST SURGICAL HISTORY: Past Surgical History:  Procedure Laterality Date  . BACK SURGERY    . CARDIAC CATHETERIZATION    . CARPAL TUNNEL RELEASE Bilateral   . CATARACT EXTRACTION W/ INTRAOCULAR LENS IMPLANT Left   . COLONOSCOPY N/A 09/27/2012   Procedure: COLONOSCOPY;  Surgeon: Lafayette Dragon, MD;  Location: [facility];  Service: Endoscopy;  Laterality: N/A;  . ESOPHAGOGASTRODUODENOSCOPY N/A 09/27/2012   Procedure: ESOPHAGOGASTRODUODENOSCOPY (EGD);  Surgeon: Lafayette Dragon, MD;  Location: [facility];  Service: Endoscopy;  Laterality: N/A;  . KNEE ARTHROSCOPY W/ MENISCAL  REPAIR  Left 2009  . LUMBAR DISC SURGERY    . LYMPH NODE DISSECTION Right 1970's   "neck; dr thought I had Hodgkins; turned out to be sarcoidosis" (11/15/2012)  . squamous papilloma   2010   removed by Dr. Constance Holster ENT, noted on tongue  . TONSILLECTOMY      FAMILY HISTORY: Family History  Problem Relation Age of Onset  . Alcohol abuse Mother   . Depression Mother   . Heart disease Mother   . Diabetes Mother   . Stroke Mother   . Coronary artery disease Father   . Diabetes Other        1/2 brother  . Hepatitis Brother     SOCIAL HISTORY: Social History   Socioeconomic History  . Marital status: Divorced    Spouse name: Not on file  . Number of children: 0  . Years of education: 53  . Highest education level: Not on file  Occupational History  . Occupation: disabled  Social Needs  . Financial resource strain: Not on file  . Food insecurity:    Worry: Not on file    Inability: Not on file  . Transportation needs:    Medical: Not on file    Non-medical: Not on file  Tobacco Use  . Smoking status: Former Smoker    Packs/day: 0.50    Years: 30.00    Pack years: 15.00    Types: Cigarettes    Last attempt to quit: 10/2017    Years since quitting: 0.2  . Smokeless tobacco: Never Used  Substance and Sexual Activity  . Alcohol use: No    Alcohol/week: 0.0 standard drinks    Comment: Alcohol stopped in September of 2014  . Drug use: No  . Sexual activity: Never  Lifestyle  . Physical activity:    Days per week: Not on file    Minutes per session: Not on file  . Stress: Not on file  Relationships  . Social connections:    Talks on phone: Not on file    Gets together: Not on file    Attends religious service: Not on file    Active member of club or organization: Not on file    Attends meetings of clubs or organizations: Not on file    Relationship status: Not on file  . Intimate partner violence:    Fear of current or ex partner: Not on file    Emotionally abused:  Not on file    Physically abused: Not on file    Forced sexual activity: Not on file  Other Topics Concern  . Not on file  Social History Narrative   The patient was born and raised by both his biological parents in the [place] area. His parents did separate in the past but never divorced. He denies any history of any physical or sexual abuse. The patient says he dropped out of school in the 12th grade. 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 He has never been married and has no children. He currently lives alone in a duplex in [place].      No caffeine use.   Right-handed.     PHYSICAL EXAM   Vitals:   01/05/18 1003  BP: 112/68  Pulse: 62    Not recorded      There is no height or weight on file to calculate BMI.  PHYSICAL EXAMNIATION:  Gen: NAD, conversant, well  nourised, obese, well groomed                     Cardiovascular: Regular rate rhythm, no peripheral edema, warm, nontender. Eyes: Conjunctivae clear without exudates or hemorrhage Neck: Supple, no carotid bruits. Pulmonary: Clear to auscultation bilaterally   NEUROLOGICAL EXAM:  MENTAL STATUS: Speech: Slurred speech; normal comprehension and expression Cognition:     Orientation to time, place and person     Normal recent and remote memory     Normal Attention span and concentration     Normal Language, naming, repeating,spontaneous speech     Fund of knowledge   CRANIAL NERVES: CN II: Visual fields are full to confrontation.  Pupils are small equal and reactive to light. CN III, IV, VI: extraocular movement are normal. No ptosis. CN V: Facial sensation is intact to pinprick in all 3 divisions bilaterally. Corneal responses are intact.  CN VII: Face is symmetric with normal eye closure and smile. CN VIII: Hearing is normal to rubbing fingers CN IX, X: Palate elevates symmetrically. Phonation is normal. CN XI: Head  turning and shoulder shrug are intact CN XII: Tongue is midline with normal movements and no atrophy.  MOTOR: He sits in wheelchair/recliner, variable effort on examination, complains of left shoulder pain, mild bilateral intrinsic hand muscle atrophy, intermittent bilateral hand resting tremor, moves right upper extremity, bilateral lower extremity against gravity by command, no significant rigidity noted  REFLEXES: Reflexes are 2+ and symmetric at the biceps, triceps, knees, and ankles. Plantar responses are flexor.  SENSORY: Withdrawal to pain  COORDINATION: Rapid alternating movements and fine finger movements are intact. There is no dysmetria on finger-to-nose and heel-knee-shin.    GAIT/STANCE: Deferred  DIAGNOSTIC DATA (LABS, IMAGING, TESTING) - I reviewed patient records, labs, notes, testing and imaging myself where available.   ASSESSMENT AND PLAN  Patrick Holt is a 67 y.o. male   Gait abnormality History of stroke   patient has variable effort on examinations, he was noted to have bilateral intrinsic hand muscle atrophy, also complains of worsening urinary incontinence, brisk reflexes,  MRI of cervical spine to rule out cervical spondylitic myelopathy  Continue rehabilitation    Marcial Pacas, M.D. Ph.D.  [facility] [address] [place], [place] [address] Ph: [phone] Fax: [phone]  CC: Medicine, [facility]

## 2018-01-05 NOTE — Telephone Encounter (Signed)
[employer] medicare order sent to GI. No auth they will reach out to the pt to schedule.  °

## 2018-01-18 ENCOUNTER — Ambulatory Visit (INDEPENDENT_AMBULATORY_CARE_PROVIDER_SITE_OTHER): Payer: Medicare Other | Admitting: Podiatry

## 2018-01-18 ENCOUNTER — Encounter: Payer: Self-pay | Admitting: Podiatry

## 2018-01-18 VITALS — BP 125/65

## 2018-01-18 DIAGNOSIS — E119 Type 2 diabetes mellitus without complications: Secondary | ICD-10-CM | POA: Diagnosis not present

## 2018-01-18 DIAGNOSIS — M79675 Pain in left toe(s): Secondary | ICD-10-CM | POA: Diagnosis not present

## 2018-01-18 DIAGNOSIS — B351 Tinea unguium: Secondary | ICD-10-CM | POA: Diagnosis not present

## 2018-01-18 DIAGNOSIS — M79674 Pain in right toe(s): Secondary | ICD-10-CM | POA: Diagnosis not present

## 2018-01-18 NOTE — Patient Instructions (Addendum)
    Apply moisturizing lotion to both feet, twice daily!!

## 2018-02-08 ENCOUNTER — Inpatient Hospital Stay (HOSPITAL_COMMUNITY)
Admission: EM | Admit: 2018-02-08 | Discharge: 2018-03-18 | DRG: 870 | Disposition: E | Payer: Medicare Other | Attending: Pulmonary Disease | Admitting: Pulmonary Disease

## 2018-02-08 ENCOUNTER — Emergency Department (HOSPITAL_COMMUNITY): Payer: Medicare Other

## 2018-02-08 ENCOUNTER — Other Ambulatory Visit: Payer: Self-pay

## 2018-02-08 DIAGNOSIS — E876 Hypokalemia: Secondary | ICD-10-CM | POA: Diagnosis present

## 2018-02-08 DIAGNOSIS — J9600 Acute respiratory failure, unspecified whether with hypoxia or hypercapnia: Secondary | ICD-10-CM

## 2018-02-08 DIAGNOSIS — I959 Hypotension, unspecified: Secondary | ICD-10-CM

## 2018-02-08 DIAGNOSIS — Z8719 Personal history of other diseases of the digestive system: Secondary | ICD-10-CM

## 2018-02-08 DIAGNOSIS — R0602 Shortness of breath: Secondary | ICD-10-CM

## 2018-02-08 DIAGNOSIS — R4182 Altered mental status, unspecified: Secondary | ICD-10-CM

## 2018-02-08 DIAGNOSIS — Z7901 Long term (current) use of anticoagulants: Secondary | ICD-10-CM

## 2018-02-08 DIAGNOSIS — I110 Hypertensive heart disease with heart failure: Secondary | ICD-10-CM | POA: Diagnosis present

## 2018-02-08 DIAGNOSIS — L8990 Pressure ulcer of unspecified site, unspecified stage: Secondary | ICD-10-CM

## 2018-02-08 DIAGNOSIS — J440 Chronic obstructive pulmonary disease with acute lower respiratory infection: Secondary | ICD-10-CM | POA: Diagnosis present

## 2018-02-08 DIAGNOSIS — K5730 Diverticulosis of large intestine without perforation or abscess without bleeding: Secondary | ICD-10-CM | POA: Diagnosis present

## 2018-02-08 DIAGNOSIS — F429 Obsessive-compulsive disorder, unspecified: Secondary | ICD-10-CM | POA: Diagnosis present

## 2018-02-08 DIAGNOSIS — A419 Sepsis, unspecified organism: Secondary | ICD-10-CM | POA: Diagnosis not present

## 2018-02-08 DIAGNOSIS — M069 Rheumatoid arthritis, unspecified: Secondary | ICD-10-CM | POA: Diagnosis present

## 2018-02-08 DIAGNOSIS — R509 Fever, unspecified: Secondary | ICD-10-CM

## 2018-02-08 DIAGNOSIS — F419 Anxiety disorder, unspecified: Secondary | ICD-10-CM | POA: Diagnosis present

## 2018-02-08 DIAGNOSIS — J13 Pneumonia due to Streptococcus pneumoniae: Secondary | ICD-10-CM | POA: Diagnosis present

## 2018-02-08 DIAGNOSIS — G4089 Other seizures: Secondary | ICD-10-CM | POA: Diagnosis present

## 2018-02-08 DIAGNOSIS — Z87891 Personal history of nicotine dependence: Secondary | ICD-10-CM

## 2018-02-08 DIAGNOSIS — T501X5A Adverse effect of loop [high-ceiling] diuretics, initial encounter: Secondary | ICD-10-CM | POA: Diagnosis present

## 2018-02-08 DIAGNOSIS — I5032 Chronic diastolic (congestive) heart failure: Secondary | ICD-10-CM | POA: Diagnosis present

## 2018-02-08 DIAGNOSIS — I952 Hypotension due to drugs: Secondary | ICD-10-CM | POA: Diagnosis present

## 2018-02-08 DIAGNOSIS — E785 Hyperlipidemia, unspecified: Secondary | ICD-10-CM | POA: Diagnosis present

## 2018-02-08 DIAGNOSIS — D869 Sarcoidosis, unspecified: Secondary | ICD-10-CM | POA: Diagnosis present

## 2018-02-08 DIAGNOSIS — Z638 Other specified problems related to primary support group: Secondary | ICD-10-CM

## 2018-02-08 DIAGNOSIS — E873 Alkalosis: Secondary | ICD-10-CM | POA: Diagnosis present

## 2018-02-08 DIAGNOSIS — J9621 Acute and chronic respiratory failure with hypoxia: Secondary | ICD-10-CM | POA: Diagnosis present

## 2018-02-08 DIAGNOSIS — E440 Moderate protein-calorie malnutrition: Secondary | ICD-10-CM

## 2018-02-08 DIAGNOSIS — Z833 Family history of diabetes mellitus: Secondary | ICD-10-CM

## 2018-02-08 DIAGNOSIS — Z87442 Personal history of urinary calculi: Secondary | ICD-10-CM

## 2018-02-08 DIAGNOSIS — Z9289 Personal history of other medical treatment: Secondary | ICD-10-CM

## 2018-02-08 DIAGNOSIS — R6521 Severe sepsis with septic shock: Secondary | ICD-10-CM | POA: Diagnosis present

## 2018-02-08 DIAGNOSIS — F319 Bipolar disorder, unspecified: Secondary | ICD-10-CM | POA: Diagnosis present

## 2018-02-08 DIAGNOSIS — Z811 Family history of alcohol abuse and dependence: Secondary | ICD-10-CM

## 2018-02-08 DIAGNOSIS — I7300 Raynaud's syndrome without gangrene: Secondary | ICD-10-CM | POA: Diagnosis present

## 2018-02-08 DIAGNOSIS — J156 Pneumonia due to other aerobic Gram-negative bacteria: Secondary | ICD-10-CM | POA: Diagnosis present

## 2018-02-08 DIAGNOSIS — J690 Pneumonitis due to inhalation of food and vomit: Secondary | ICD-10-CM | POA: Diagnosis present

## 2018-02-08 DIAGNOSIS — I480 Paroxysmal atrial fibrillation: Secondary | ICD-10-CM | POA: Diagnosis present

## 2018-02-08 DIAGNOSIS — Z818 Family history of other mental and behavioral disorders: Secondary | ICD-10-CM

## 2018-02-08 DIAGNOSIS — E1165 Type 2 diabetes mellitus with hyperglycemia: Secondary | ICD-10-CM | POA: Diagnosis present

## 2018-02-08 DIAGNOSIS — Z515 Encounter for palliative care: Secondary | ICD-10-CM | POA: Diagnosis present

## 2018-02-08 DIAGNOSIS — N179 Acute kidney failure, unspecified: Secondary | ICD-10-CM | POA: Diagnosis present

## 2018-02-08 DIAGNOSIS — M797 Fibromyalgia: Secondary | ICD-10-CM | POA: Diagnosis present

## 2018-02-08 DIAGNOSIS — Z823 Family history of stroke: Secondary | ICD-10-CM

## 2018-02-08 DIAGNOSIS — I272 Pulmonary hypertension, unspecified: Secondary | ICD-10-CM | POA: Diagnosis present

## 2018-02-08 DIAGNOSIS — I259 Chronic ischemic heart disease, unspecified: Secondary | ICD-10-CM | POA: Diagnosis present

## 2018-02-08 DIAGNOSIS — Z86010 Personal history of colonic polyps: Secondary | ICD-10-CM

## 2018-02-08 DIAGNOSIS — J90 Pleural effusion, not elsewhere classified: Secondary | ICD-10-CM

## 2018-02-08 DIAGNOSIS — I252 Old myocardial infarction: Secondary | ICD-10-CM

## 2018-02-08 DIAGNOSIS — Z8673 Personal history of transient ischemic attack (TIA), and cerebral infarction without residual deficits: Secondary | ICD-10-CM

## 2018-02-08 DIAGNOSIS — G92 Toxic encephalopathy: Secondary | ICD-10-CM | POA: Diagnosis present

## 2018-02-08 DIAGNOSIS — Z8249 Family history of ischemic heart disease and other diseases of the circulatory system: Secondary | ICD-10-CM

## 2018-02-08 DIAGNOSIS — Z66 Do not resuscitate: Secondary | ICD-10-CM | POA: Diagnosis present

## 2018-02-08 DIAGNOSIS — J9601 Acute respiratory failure with hypoxia: Secondary | ICD-10-CM

## 2018-02-08 DIAGNOSIS — E1142 Type 2 diabetes mellitus with diabetic polyneuropathy: Secondary | ICD-10-CM | POA: Diagnosis present

## 2018-02-08 DIAGNOSIS — T4275XA Adverse effect of unspecified antiepileptic and sedative-hypnotic drugs, initial encounter: Secondary | ICD-10-CM | POA: Diagnosis present

## 2018-02-08 DIAGNOSIS — Z23 Encounter for immunization: Secondary | ICD-10-CM

## 2018-02-08 DIAGNOSIS — Z978 Presence of other specified devices: Secondary | ICD-10-CM

## 2018-02-08 LAB — I-STAT CG4 LACTIC ACID, ED: Lactic Acid, Venous: 1.64 mmol/L (ref 0.5–1.9)

## 2018-02-08 MED ORDER — VANCOMYCIN HCL 10 G IV SOLR
1500.0000 mg | Freq: Once | INTRAVENOUS | Status: AC
  Administered 2018-02-09: 1500 mg via INTRAVENOUS
  Filled 2018-02-08: qty 1500

## 2018-02-08 MED ORDER — PIPERACILLIN-TAZOBACTAM 3.375 G IVPB 30 MIN
3.3750 g | Freq: Once | INTRAVENOUS | Status: AC
  Administered 2018-02-08: 3.375 g via INTRAVENOUS
  Filled 2018-02-08: qty 50

## 2018-02-08 MED ORDER — SODIUM CHLORIDE 0.9 % IV BOLUS (SEPSIS)
500.0000 mL | Freq: Once | INTRAVENOUS | Status: AC
  Administered 2018-02-08: 500 mL via INTRAVENOUS

## 2018-02-08 MED ORDER — SODIUM CHLORIDE 0.9 % IV BOLUS (SEPSIS)
1000.0000 mL | Freq: Once | INTRAVENOUS | Status: AC
  Administered 2018-02-08: 1000 mL via INTRAVENOUS

## 2018-02-08 NOTE — ED Triage Notes (Signed)
Per ems pt is from [facility] facility noticed pt was more altered than normal. Hot to touch. Pt hx of COPD and schizophrenia. Upon arrival bp 76/58 given 800 ml fluid then pressures 97/61 cbg 1115. Pt was given 5mg  albuterol   [place] Progression Recent Vital Signs   There were no vitals taken for this visit.   Past Medical History:  Diagnosis Date  . Anemia   . Anxiety   . Asthma   . Atrial fibrillation ([place]) 01/2012   with RVR  . Bipolar 1 disorder ([place])   . Cancer ([place])   . Collagen vascular disease ([place])   . Conversion disorder 06/2010  . COPD (chronic obstructive pulmonary disease) ([place])   . CVA (cerebral vascular accident) ([place])   . Dermatomyositis ([place])   . Dermatomyositis ([place])   . Diverticulitis   . Diverticulosis of colon   . Dysrhythmia    "irregular" (11/15/2012)  . Esophageal dysmotility   . Esophageal stricture   . Fibromyalgia   . Gastritis   . GERD (gastroesophageal reflux disease)   . Hand fracture, right    sept 13, 2017, still wearing splint as og 01-10-2017  . Headache(784.0)    "severe; get them often" (11/15/2012)  . Hiatal hernia   . History of narcotic addiction ([place])   . Hx of adenomatous colonic polyps   . Hyperlipidemia   . Hypertension   . Internal hemorrhoids   . Ischemic heart disease   . Major depression    with acute psychotic break in 06/2010  . Myocardial infarct ([place])    mulitple (1999, 2000, 2004)  . Narcotic dependence ([place])   . Nephrolithiasis   . Obesity   . OCD (obsessive compulsive disorder)   . Otosclerosis   . Paroxysmal A-fib ([place])   . Peripheral neuropathy   . Raynaud's disease   . Renal insufficiency   . Rheumatoid arthritis(714.0)   . Sarcoidosis   . Seizures ([place])   . Subarachnoid hemorrhage ([place]) 01/2012   with subdural  hematoma.   . Tremor   . Type II diabetes mellitus ([place])   . Urge incontinence   . Vertigo      Expected Discharge Date     Diet Order    None       VTE Documentation       Work Intensity Score/Level of Care     @LEVELOFCARE @   Mobility        Significant Events    DC Barriers   Abnormal Labs:  Tori Milks 01/31/2018, 11:16 PM atrovent.

## 2018-02-08 NOTE — ED Provider Notes (Signed)
[facility] EMERGENCY DEPARTMENT Provider Note   CSN: [id number] Arrival date & time: 01/27/2018  2309     History   Chief Complaint Chief Complaint  Patient presents with  . Altered Mental Status    HPI Patrick Holt is a 67 y.o. male.  The history is provided by the patient and medical records.  Altered Mental Status       LEVEL V CAVEAT:  AMS 67 year old male with history of anemia, anxiety, A. fib, bipolar disorder, COPD, dermatomyositis, fibromyalgia, GERD, diabetes, sarcoidosis, rheumatoid arthritis, peripheral neuropathy, presenting to the ED with altered mental status.  Patient is from rehab nursing facility and staff reports they checked on him this evening and noticed that he was somewhat lethargic and not really responding.  Patient was hypotensive with EMS in to the 83'M systolic, given 629 cc bolus and BP improved to 47'M systolic.  On arrival, patient hypotensive, tachycardic, warm to touch, and borderline hypoxic.  He does open his eyes to painful stimuli but no other significant interaction.  Past Medical History:  Diagnosis Date  . Anemia   . Anxiety   . Asthma   . Atrial fibrillation ([place]) 01/2012   with RVR  . Bipolar 1 disorder ([place])   . Cancer ([place])   . Collagen vascular disease ([place])   . Conversion disorder 06/2010  . COPD (chronic obstructive pulmonary disease) ([place])   . CVA (cerebral vascular accident) ([place])   . Dermatomyositis ([place])   . Dermatomyositis ([place])   . Diverticulitis   . Diverticulosis of colon   . Dysrhythmia    "irregular" (11/15/2012)  . Esophageal dysmotility   . Esophageal stricture   . Fibromyalgia   . Gastritis   . GERD (gastroesophageal reflux disease)   . Hand fracture, right    sept 13, 2017, still wearing splint as og 01-10-2017  . Headache(784.0)    "severe; get them often" (11/15/2012)  . Hiatal hernia   . History of narcotic addiction ([place])   . Hx of adenomatous colonic polyps   .  Hyperlipidemia   . Hypertension   . Internal hemorrhoids   . Ischemic heart disease   . Major depression    with acute psychotic break in 06/2010  . Myocardial infarct ([place])    mulitple (1999, 2000, 2004)  . Narcotic dependence ([place])   . Nephrolithiasis   . Obesity   . OCD (obsessive compulsive disorder)   . Otosclerosis   . Paroxysmal A-fib ([place])   . Peripheral neuropathy   . Raynaud's disease   . Renal insufficiency   . Rheumatoid arthritis(714.0)   . Sarcoidosis   . Seizures ([place])   . Subarachnoid hemorrhage ([place]) 01/2012   with subdural  hematoma.   . Tremor   . Type II diabetes mellitus ([place])   . Urge incontinence   . Vertigo     Patient Active Problem List   Diagnosis Date Noted  . Gait abnormality 01/05/2018  . Urinary incontinence 01/05/2018  . Atrial fibrillation with RVR ([place])   . Aspiration pneumonia of both lungs due to gastric secretions ([place])   . Fall 11/02/2017  . History of pulmonary embolism 10/31/2017  . Left-sided weakness 10/31/2017  . Anasarca 10/11/2017  . Pulmonary embolism ([place]) 10/11/2017  . ARF (acute renal failure) ([place]) 09/17/2017  . Unstable angina ([place]) 06/16/2017  . L1 and L2 vertebral fracture ([place]) 04/06/2017  . Multiple rib fractures 04/06/2017  . Evaluation by psychiatric service required 03/07/2017  . Acute  metabolic encephalopathy 46/27/0350  . Hypoglycemia 03/04/2017  . Acute respiratory failure with hypoxia ([place]) 10-10-2017  . Acute encephalopathy 10-10-2017  . Rhabdomyolysis 02/05/2017  . Acute respiratory failure with hypoxia and hypercapnia () 02/05/2017  . COPD with acute exacerbation ([place]) 02/05/2017  . Bipolar 1 disorder ([place]) 02/05/2017  . Anemia 02/05/2017  . Hypertension 02/05/2017  . Hand fracture, right 02/05/2017  . Narcotic dependence ([place]) 02/05/2017  . Pre-diabetes 02/05/2017  . PAF (paroxysmal atrial fibrillation) ([place]) 02/05/2017  . Frequent falls 02/05/2017  . History of dermatomyositis 02/05/2017    . Seizure disorder ([place]) 02/05/2017  . Bilateral lower extremity edema 02/05/2017  . Pulmonary artery hypertension ([place]) 01/15/2017  . Altered mental status 01/13/2017  . Low oxygen saturation   . Pain in right wrist 02/27/2016  . Altered mental state 11/14/2015  . Severe dehydration 11/14/2015  . Hypernatremia 11/14/2015  . Difficulty in walking, not elsewhere classified   . Right wrist fracture   . Closed displaced fracture of ulnar styloid with routine healing   . Scapholunate ligament injury with no instability   . Falls frequently   . Hypoxia 11/02/2015  . Anxiety 06/06/2015  . Impaired mobility 04/28/2015  . Lumbar spondylosis with myelopathy 03/07/2015  . Nocturia 12/03/2014  . Chronic venous insufficiency 09/23/2014  . Poor social situation 07/30/2014  . Schizophrenia, acute ([place])   . Schizoaffective disorder, bipolar type ([place])   . Decreased visual acuity 06/27/2014  . COPD exacerbation ([place])   . Right sided weakness 01/04/2014  . Essential hypertension, benign   . Atrial fibrillation, unspecified   . Radiculopathy of cervical region   . Polyuria 12/07/2013  . Erectile dysfunction 12/07/2013  . Diverticulitis 08/01/2013  . Acute diverticulitis 08/01/2013  . Hematochezia 06/04/2013  . Dehydration 11/09/2012  . Hypokalemia 10/03/2012  . Benign neoplasm of colon 09/27/2012  . Stricture and stenosis of esophagus 09/27/2012  . Diverticulitis of colon without hemorrhage 07/26/2012  . Polypharmacy 03/26/2012  . Seizures ([place]) 02/10/2012  . Atrial fibrillation ([place]) 02/05/2012  . Coronary atherosclerosis of native coronary artery 02/05/2012  . Radicular low back pain 03/09/2011  . History of seizure disorder 01/29/2011  . Irritable bowel syndrome (IBS) 06/24/2010  . Urge incontinence 06/19/2010  . Tobacco abuse 11/23/2008  . DEPRESSION, MAJOR 05/31/2008  . ISCHEMIC HEART DISEASE 05/31/2008  . RAYNAUD'S DISEASE 05/31/2008  . HIATAL HERNIA 05/31/2008  . Rheumatoid  arthritis ([place]) 05/31/2008  . FIBROMYALGIA 05/31/2008  . Hyperlipidemia 01/26/2008  . Anxiety state 01/26/2008  . HEMORRHOIDS, INTERNAL 01/26/2008  . COPD (chronic obstructive pulmonary disease) ([place]) 01/26/2008  . ESOPHAGEAL MOTILITY DISORDER 01/26/2008  . GERD 01/26/2008  . Dermatomyositis ([place]) 01/26/2008    Past Surgical History:  Procedure Laterality Date  . BACK SURGERY    . CARDIAC CATHETERIZATION    . CARPAL TUNNEL RELEASE Bilateral   . CATARACT EXTRACTION W/ INTRAOCULAR LENS IMPLANT Left   . COLONOSCOPY N/A 09/27/2012   Procedure: COLONOSCOPY;  Surgeon: Lafayette Dragon, MD;  Location: [facility] END[facility]COPY;  Service: Endoscopy;  Laterality: N/A;  . ESOPHAGOGASTRODUODENOSCOPY N/A 09/27/2012   Procedure: ESOPHAGOGASTRODUODENOSCOPY (EGD);  Surgeon: Lafayette Dragon, MD;  Location: [facility];  Service: Endoscopy;  Laterality: N/A;  . KNEE ARTHROSCOPY W/ MENISCAL REPAIR Left 2009  . LUMBAR DISC SURGERY    . LYMPH NODE DISSECTION Right 1970's   "neck; dr thought I had Hodgkins; turned out to be sarcoidosis" (11/15/2012)  . squamous papilloma   2010   removed by Dr. Constance Holster ENT, noted on tongue  .  TONSILLECTOMY          Home Medications    Prior to Admission medications   Medication Sig Start Date End Date Taking? Authorizing Provider  acetaminophen (TYLENOL) 325 MG tablet Take 2 tablets (650 mg total) by mouth every 6 (six) hours as needed for mild pain (or Fever >/= 101). 11/04/17   Debbe Odea, MD  albuterol (PROVENTIL) (2.5 MG/3ML) 0.083% nebulizer solution Take 3 mLs (2.5 mg total) by nebulization every 4 (four) hours as needed for wheezing or shortness of breath. 11/10/17   Sheikh, Omair Latif, DO  ALPRAZolam Duanne Moron) 0.5 MG tablet Take 1 tablet (0.5 mg total) by mouth 3 (three) times daily. 10/14/17   Elgergawy, Silver Huguenin, MD  amiodarone (PACERONE) 400 MG tablet Take 1 tablet (400 mg total) by mouth daily. 11/11/17   Raiford Noble Latif, DO  apixaban (ELIQUIS) 5 MG TABS tablet Take 10  mg oral twice daily for total of  days, then transition to 5 mg oral twice daily on 10/21/2017 10/18/17   Elgergawy, Silver Huguenin, MD  atorvastatin (LIPITOR) 40 MG tablet Take 1 tablet (40 mg total) by mouth daily. 09/07/17   Desiree Hane, MD  benzonatate (TESSALON) 100 MG capsule Take 1 capsule (100 mg total) by mouth 3 (three) times daily. 11/10/17   Raiford Noble Latif, DO  budesonide-formoterol (SYMBICORT) 160-4.5 MCG/ACT inhaler Inhale 2 puffs into the lungs 2 (two) times daily. 09/07/17   Oretha Milch D, MD  divalproex (DEPAKOTE) 250 MG DR tablet Take 1 tablet (250 mg total) by mouth 3 (three) times daily. 09/07/17   Oretha Milch D, MD  ergocalciferol (VITAMIN D2) 50000 units capsule Take 1 capsule (50,000 Units total) by mouth once a week. 09/07/17   Desiree Hane, MD  esomeprazole (NEXIUM) 40 MG capsule Take 1 capsule (40 mg total) by mouth daily. 09/07/17   Desiree Hane, MD  fluconazole (DIFLUCAN) 100 MG tablet Take 1 tablet (100 mg total) by mouth daily. 11/11/17   Raiford Noble Latif, DO  Fluticasone-Salmeterol (ADVAIR) 250-50 MCG/DOSE AEPB Inhale 1 puff into the lungs 2 (two) times daily.    [provider]  guaiFENesin (MUCINEX) 600 MG 12 hr tablet Take 2 tablets (1,200 mg total) by mouth 2 (two) times daily. 10/18/17   Elgergawy, Silver Huguenin, MD  ipratropium-albuterol (DUONEB) 0.5-2.5 (3) MG/3ML SOLN Inhale 3 mLs into the lungs every 6 (six) hours as needed. Patient taking differently: Inhale 3 mLs into the lungs every 6 (six) hours as needed (wheezing).  09/07/17   Desiree Hane, MD  levETIRAcetam (KEPPRA) 500 MG tablet Take 1 tablet (500 mg total) by mouth 2 (two) times daily. 09/07/17   Oretha Milch D, MD  lidocaine (LIDODERM) 5 % Place 1 patch onto the skin daily. Remove & Discard patch within 12 hours or as directed by MD 11/11/17   Kerney Elbe, DO  loperamide (IMODIUM) 2 MG capsule Take 1 capsule (2 mg total) by mouth as needed for diarrhea or loose stools. 09/07/17    Desiree Hane, MD  Maltodextrin-Xanthan Gum (RESOURCE THICKENUP CLEAR) POWD Take 120 g by mouth as needed. 11/10/17   Raiford Noble Latif, DO  Multiple Vitamin (MULTIVITAMIN WITH MINERALS) TABS tablet Take 1 tablet by mouth daily. 09/07/17   Oretha Milch D, MD  nicotine (NICODERM CQ - DOSED IN MG/24 HOURS) 21 mg/24hr patch Place 1 patch (21 mg total) onto the skin daily. 10/15/17   Elgergawy, Silver Huguenin, MD  nutrition supplement, JUVEN, (JUVEN) PACK  Take 1 packet by mouth 2 (two) times daily between meals. 11/04/17   Debbe Odea, MD  nystatin (MYCOSTATIN) 100000 UNIT/ML suspension Take 5 mLs (500,000 Units total) by mouth 4 (four) times daily. 11/10/17   Raiford Noble Latif, DO  omeprazole (PRILOSEC) 20 MG capsule Take 20 mg by mouth daily.    [provider]  oxybutynin (DITROPAN) 5 MG tablet Take 1 tablet (5 mg total) by mouth 3 (three) times daily. 09/07/17   Oretha Milch D, MD  Potassium Chloride ER 20 MEQ TBCR Take 20 mEq by mouth every other day. 09/20/17   Patrecia Pour, MD  predniSONE (DELTASONE) 10 MG tablet Take 0.5 tablets (5 mg total) by mouth daily with breakfast. 10/18/17   Elgergawy, Silver Huguenin, MD  risperiDONE (RISPERDAL) 1 MG tablet Take 1 tablet (1 mg total) by mouth 2 (two) times daily. 09/07/17   Desiree Hane, MD  senna-docusate (SENOKOT-S) 8.6-50 MG tablet Take 1 tablet by mouth at bedtime as needed for mild constipation. 09/07/17   Desiree Hane, MD    Family History Family History  Problem Relation Age of Onset  . Alcohol abuse Mother   . Depression Mother   . Heart disease Mother   . Diabetes Mother   . Stroke Mother   . Coronary artery disease Father   . Diabetes Other        1/2 brother  . Hepatitis Brother     Social History Social History   Tobacco Use  . Smoking status: Former Smoker    Packs/day: 0.50    Years: 30.00    Pack years: 15.00    Types: Cigarettes    Last attempt to quit: 10/2017    Years since quitting: 0.3  . Smokeless  tobacco: Never Used  Substance Use Topics  . Alcohol use: No    Alcohol/week: 0.0 standard drinks    Comment: Alcohol stopped in September of 2014  . Drug use: No     Allergies   Immune globulins; Rho (d) immune globulin; Ciprofloxacin; Flagyl [metronidazole]; Lisinopril; Sulfa antibiotics; Sulfasalazine; Cefepime; Morphine and related; Requip [ropinirole hcl]; Vancomycin; Betamethasone dipropionate; Betamethasone dipropionate; Bupropion; Bupropion hcl; Clobetasol; Codeine; Escitalopram oxalate; Escitalopram oxalate; Fluoxetine hcl; Fluoxetine hcl; Furosemide; Meclizine; Paroxetine; Penicillins; Tacrolimus; Tetanus toxoid; and Tuberculin purified protein derivative   Review of Systems Review of Systems  Unable to perform ROS: Mental status change     Physical Exam Updated Vital Signs BP (!) 75/58 (BP Location: Right Arm)   Pulse (!) 123   Temp 99.2 F (37.3 C) (Axillary)   Resp (!) 25   Ht 5\' 9"  (1.753 m)   Wt 81.2 kg   SpO2 92%   BMI 26.43 kg/m   Physical Exam Vitals signs and nursing note reviewed.  Constitutional:      Appearance: He is well-developed. He is ill-appearing.     Comments: Warm to the touch  HENT:     Head: Normocephalic and atraumatic.     Comments: Protecting airway Eyes:     Conjunctiva/sclera: Conjunctivae normal.     Pupils: Pupils are equal, round, and reactive to light.  Neck:     Musculoskeletal: Normal range of motion.  Cardiovascular:     Rate and Rhythm: Regular rhythm. Tachycardia present.     Heart sounds: Normal heart sounds.  Pulmonary:     Effort: Pulmonary effort is normal.     Breath sounds: No stridor. Rhonchi present. No wheezing.     Comments: Diffuse rhonchi, O2  sats 92% on albuterol neb Abdominal:     General: Bowel sounds are normal.     Palpations: Abdomen is soft. There is no mass.  Musculoskeletal: Normal range of motion.     Comments: No significant peripheral edema  Skin:    General: Skin is warm and dry.       ED Treatments / Results  Labs (all labs ordered are listed, but only abnormal results are displayed) Labs Reviewed  COMPREHENSIVE METABOLIC PANEL - Abnormal; Notable for the following components:      Result Value   Glucose, Bld 111 (*)    BUN 28 (*)    Creatinine, Ser 1.69 (*)    Calcium 8.6 (*)    Total Protein 5.0 (*)    Albumin 2.1 (*)    GFR calc non Af Amer 41 (*)    GFR calc Af Amer 47 (*)    All other components within normal limits  CBC WITH DIFFERENTIAL/PLATELET - Abnormal; Notable for the following components:   RDW 16.8 (*)    Abs Immature Granulocytes 0.09 (*)    All other components within normal limits  URINALYSIS, ROUTINE W REFLEX MICROSCOPIC - Abnormal; Notable for the following components:   Color, Urine AMBER (*)    APPearance HAZY (*)    Bilirubin Urine SMALL (*)    Ketones, ur 5 (*)    All other components within normal limits  VALPROIC ACID LEVEL - Abnormal; Notable for the following components:   Valproic Acid Lvl 20 (*)    All other components within normal limits  I-STAT ARTERIAL BLOOD GAS, ED - Abnormal; Notable for the following components:   pH, Arterial 7.327 (*)    pO2, Arterial 62.0 (*)    All other components within normal limits  I-STAT ARTERIAL BLOOD GAS, ED - Abnormal; Notable for the following components:   pH, Arterial 7.311 (*)    pO2, Arterial 79.0 (*)    Acid-base deficit 3.0 (*)    All other components within normal limits  CULTURE, BLOOD (ROUTINE X 2)  CULTURE, BLOOD (ROUTINE X 2)  URINE CULTURE  PROCALCITONIN  INFLUENZA PANEL BY PCR (TYPE A & B)  CORTISOL  BLOOD GAS, ARTERIAL  I-STAT CG4 LACTIC ACID, ED  I-STAT CG4 LACTIC ACID, ED    EKG EKG Interpretation  Date/Time:  Wednesday February 08 2018 23:13:04 EST Ventricular Rate:  115 PR Interval:    QRS Duration: 102 QT Interval:  364 QTC Calculation: 504 R Axis:   -69 Text Interpretation:  Atrial fibrillation Inferior infarct, old Lateral leads are also  involved Prolonged QT interval Confirmed by Virgel Manifold [phone]) on 01/30/2018 11:22:01 PM   Radiology Dg Chest Port 1 View  Result Date: 01/26/2018 CLINICAL DATA:  Sepsis and dyspnea EXAM: PORTABLE CHEST 1 VIEW COMPARISON:  11/10/2017 FINDINGS: Stable cardiomegaly with mild aortic atherosclerosis. Central vascular congestion is redemonstrated. Slight improvement in aeration at the lung bases with probable decrease in presumed small effusions. No acute pulmonary consolidation. Acute nor suspicious osseous abnormality. IMPRESSION: Stable cardiomegaly with mild central vascular congestion. Slight improvement in aeration at the lung bases with probable decrease in presumed small effusions. Electronically Signed   By: [name] Royalty M.D.   On: 01/27/2018 23:46    Procedures Procedures (including critical care time)  CRITICAL CARE Performed by: Larene Pickett   Total critical care time: 60 minutes  Critical care time was exclusive of separately billable procedures and treating other patients.  Critical care was necessary to  treat or prevent imminent or life-threatening deterioration.  Critical care was time spent personally by me on the following activities: development of treatment plan with patient and/or surrogate as well as nursing, discussions with consultants, evaluation of patient's response to treatment, examination of patient, obtaining history from patient or surrogate, ordering and performing treatments and interventions, ordering and review of laboratory studies, ordering and review of radiographic studies, pulse oximetry and re-evaluation of patient's condition.   Medications Ordered in ED Medications  norepinephrine (LEVOPHED) 4 mg in dextrose 5 % 250 mL (0.016 mg/mL) infusion (3 mcg/min Intravenous Transfusing/Transfer 02/09/18 0257)  sodium chloride 0.9 % bolus 1,000 mL (0 mLs Intravenous Stopped 02/09/18 0013)    And  sodium chloride 0.9 % bolus 1,000 mL (0 mLs Intravenous  Stopped 02/09/18 0037)    And  sodium chloride 0.9 % bolus 500 mL (500 mLs Intravenous Transfusing/Transfer 02/09/18 0257)  piperacillin-tazobactam (ZOSYN) IVPB 3.375 g (0 g Intravenous Stopped 02/09/18 0026)  vancomycin (VANCOCIN) 1,500 mg in sodium chloride 0.9 % 500 mL IVPB (0 mg Intravenous Stopped 02/09/18 0240)  sodium chloride 0.9 % bolus 1,000 mL (0 mLs Intravenous Stopped 02/09/18 0143)     Initial Impression / Assessment and Plan / ED Course  I have reviewed the triage vital signs and the nursing notes.  Pertinent labs & imaging results that were available during my care of the patient were reviewed by me and considered in my medical decision making (see chart for details).  67 year old male here with altered mental status.  On arrival he is hypertensive, tachycardic, warm to the touch with increased work of breathing.  Code sepsis activated upon initial assessment.  Concern for possible pneumonia.  Broad-spectrum antibiotics started as well as fluid boluses.  Labs and chest x-ray pending.  Blood and urine culture sent.  Flu swab pending.  No signs of trauma so will hold off on CT head.  7858-- patient reassessed.  Desaturating into the low 80s on nasal cannula.  He was transitioned to BiPAP.  Upon doing this he was found to have chewed up food in his cheek and he was suctioned with other food particles suctioned out.  Suspect he may have aspirated.  Will get ABG as well.  12:43 AM On re-check patient mental status is improving on bipap.  He is able to tell us his name and the date.  He continues protecting airway.  sats still upper 80's on bipap.  His BP is not responding to fluids, down to 59 systolic.  Will start levophed.  1:08 AM BP trending back up with levophed, now 77/52.  He continues maintaining his airway.  Consult to ICU for admission-- discussed with Dr. Jimmy Footman, ground team to evaluate.  PCCM has evaluated-- patient will be admitted.  BP has improved into the low  85'O systolic.  O2 sats also improving after additional suctioning by RT.  Final Clinical Impressions(s) / ED Diagnoses   Final diagnoses:  Altered mental status, unspecified altered mental status type  Hypotension, unspecified hypotension type  Fever, unspecified fever cause  Sepsis, due to unspecified organism, unspecified whether acute organ dysfunction present [facility])    ED Discharge Orders    None       Larene Pickett, PA-C 02/09/18 0306    Fatima Blank, MD 02/09/18 [phone]

## 2018-02-09 ENCOUNTER — Encounter (HOSPITAL_COMMUNITY): Payer: Self-pay

## 2018-02-09 ENCOUNTER — Inpatient Hospital Stay (HOSPITAL_COMMUNITY): Payer: Medicare Other

## 2018-02-09 DIAGNOSIS — E1142 Type 2 diabetes mellitus with diabetic polyneuropathy: Secondary | ICD-10-CM | POA: Diagnosis present

## 2018-02-09 DIAGNOSIS — G92 Toxic encephalopathy: Secondary | ICD-10-CM | POA: Diagnosis present

## 2018-02-09 DIAGNOSIS — E873 Alkalosis: Secondary | ICD-10-CM | POA: Diagnosis present

## 2018-02-09 DIAGNOSIS — J13 Pneumonia due to Streptococcus pneumoniae: Secondary | ICD-10-CM | POA: Diagnosis present

## 2018-02-09 DIAGNOSIS — A419 Sepsis, unspecified organism: Secondary | ICD-10-CM | POA: Diagnosis present

## 2018-02-09 DIAGNOSIS — E785 Hyperlipidemia, unspecified: Secondary | ICD-10-CM | POA: Diagnosis present

## 2018-02-09 DIAGNOSIS — J9811 Atelectasis: Secondary | ICD-10-CM | POA: Diagnosis not present

## 2018-02-09 DIAGNOSIS — N179 Acute kidney failure, unspecified: Secondary | ICD-10-CM

## 2018-02-09 DIAGNOSIS — R6521 Severe sepsis with septic shock: Secondary | ICD-10-CM

## 2018-02-09 DIAGNOSIS — Z515 Encounter for palliative care: Secondary | ICD-10-CM | POA: Diagnosis present

## 2018-02-09 DIAGNOSIS — R4182 Altered mental status, unspecified: Secondary | ICD-10-CM

## 2018-02-09 DIAGNOSIS — F319 Bipolar disorder, unspecified: Secondary | ICD-10-CM | POA: Diagnosis present

## 2018-02-09 DIAGNOSIS — Z66 Do not resuscitate: Secondary | ICD-10-CM | POA: Diagnosis present

## 2018-02-09 DIAGNOSIS — J9601 Acute respiratory failure with hypoxia: Secondary | ICD-10-CM | POA: Diagnosis not present

## 2018-02-09 DIAGNOSIS — I5032 Chronic diastolic (congestive) heart failure: Secondary | ICD-10-CM | POA: Diagnosis present

## 2018-02-09 DIAGNOSIS — G4089 Other seizures: Secondary | ICD-10-CM | POA: Diagnosis present

## 2018-02-09 DIAGNOSIS — I959 Hypotension, unspecified: Secondary | ICD-10-CM | POA: Diagnosis not present

## 2018-02-09 DIAGNOSIS — G9340 Encephalopathy, unspecified: Secondary | ICD-10-CM | POA: Diagnosis not present

## 2018-02-09 DIAGNOSIS — E876 Hypokalemia: Secondary | ICD-10-CM | POA: Diagnosis present

## 2018-02-09 DIAGNOSIS — Z23 Encounter for immunization: Secondary | ICD-10-CM | POA: Diagnosis present

## 2018-02-09 DIAGNOSIS — J90 Pleural effusion, not elsewhere classified: Secondary | ICD-10-CM | POA: Diagnosis not present

## 2018-02-09 DIAGNOSIS — J440 Chronic obstructive pulmonary disease with acute lower respiratory infection: Secondary | ICD-10-CM | POA: Diagnosis present

## 2018-02-09 DIAGNOSIS — R509 Fever, unspecified: Secondary | ICD-10-CM | POA: Diagnosis present

## 2018-02-09 DIAGNOSIS — L270 Generalized skin eruption due to drugs and medicaments taken internally: Secondary | ICD-10-CM | POA: Diagnosis not present

## 2018-02-09 DIAGNOSIS — D869 Sarcoidosis, unspecified: Secondary | ICD-10-CM | POA: Diagnosis present

## 2018-02-09 DIAGNOSIS — R569 Unspecified convulsions: Secondary | ICD-10-CM

## 2018-02-09 DIAGNOSIS — J156 Pneumonia due to other aerobic Gram-negative bacteria: Secondary | ICD-10-CM | POA: Diagnosis present

## 2018-02-09 DIAGNOSIS — J9621 Acute and chronic respiratory failure with hypoxia: Secondary | ICD-10-CM | POA: Diagnosis present

## 2018-02-09 DIAGNOSIS — E440 Moderate protein-calorie malnutrition: Secondary | ICD-10-CM | POA: Diagnosis present

## 2018-02-09 DIAGNOSIS — Z978 Presence of other specified devices: Secondary | ICD-10-CM | POA: Diagnosis not present

## 2018-02-09 DIAGNOSIS — E1165 Type 2 diabetes mellitus with hyperglycemia: Secondary | ICD-10-CM | POA: Diagnosis present

## 2018-02-09 DIAGNOSIS — J690 Pneumonitis due to inhalation of food and vomit: Secondary | ICD-10-CM | POA: Diagnosis present

## 2018-02-09 LAB — CBC WITH DIFFERENTIAL/PLATELET
Abs Immature Granulocytes: 0.09 10*3/uL — ABNORMAL HIGH (ref 0.00–0.07)
Basophils Absolute: 0 10*3/uL (ref 0.0–0.1)
Basophils Relative: 1 %
EOS ABS: 0 10*3/uL (ref 0.0–0.5)
Eosinophils Relative: 0 %
HCT: 44.6 % (ref 39.0–52.0)
Hemoglobin: 14.1 g/dL (ref 13.0–17.0)
Immature Granulocytes: 1 %
Lymphocytes Relative: 18 %
Lymphs Abs: 1.4 10*3/uL (ref 0.7–4.0)
MCH: 30.5 pg (ref 26.0–34.0)
MCHC: 31.6 g/dL (ref 30.0–36.0)
MCV: 96.3 fL (ref 80.0–100.0)
Monocytes Absolute: 0.2 10*3/uL (ref 0.1–1.0)
Monocytes Relative: 2 %
NEUTROS PCT: 78 %
Neutro Abs: 6.3 10*3/uL (ref 1.7–7.7)
Platelets: 187 10*3/uL (ref 150–400)
RBC: 4.63 MIL/uL (ref 4.22–5.81)
RDW: 16.8 % — ABNORMAL HIGH (ref 11.5–15.5)
WBC: 8.1 10*3/uL (ref 4.0–10.5)
nRBC: 0 % (ref 0.0–0.2)

## 2018-02-09 LAB — I-STAT ARTERIAL BLOOD GAS, ED
Acid-base deficit: 2 mmol/L (ref 0.0–2.0)
Acid-base deficit: 3 mmol/L — ABNORMAL HIGH (ref 0.0–2.0)
BICARBONATE: 24.4 mmol/L (ref 20.0–28.0)
Bicarbonate: 23.8 mmol/L (ref 20.0–28.0)
O2 Saturation: 89 %
O2 Saturation: 94 %
Patient temperature: 99.3
Patient temperature: 99.3
TCO2: 25 mmol/L (ref 22–32)
TCO2: 26 mmol/L (ref 22–32)
pCO2 arterial: 46.8 mmHg (ref 32.0–48.0)
pCO2 arterial: 47.4 mmHg (ref 32.0–48.0)
pH, Arterial: 7.311 — ABNORMAL LOW (ref 7.350–7.450)
pH, Arterial: 7.327 — ABNORMAL LOW (ref 7.350–7.450)
pO2, Arterial: 62 mmHg — ABNORMAL LOW (ref 83.0–108.0)
pO2, Arterial: 79 mmHg — ABNORMAL LOW (ref 83.0–108.0)

## 2018-02-09 LAB — RENAL FUNCTION PANEL
ANION GAP: 11 (ref 5–15)
Albumin: 1.9 g/dL — ABNORMAL LOW (ref 3.5–5.0)
BUN: 27 mg/dL — ABNORMAL HIGH (ref 8–23)
CO2: 23 mmol/L (ref 22–32)
Calcium: 8.1 mg/dL — ABNORMAL LOW (ref 8.9–10.3)
Chloride: 111 mmol/L (ref 98–111)
Creatinine, Ser: 1.78 mg/dL — ABNORMAL HIGH (ref 0.61–1.24)
GFR calc Af Amer: 44 mL/min — ABNORMAL LOW (ref >60)
GFR, EST NON AFRICAN AMERICAN: 38 mL/min — AB (ref >60)
Glucose, Bld: 120 mg/dL — ABNORMAL HIGH (ref 70–99)
Phosphorus: 4.4 mg/dL (ref 2.5–4.6)
Potassium: 4.3 mmol/L (ref 3.5–5.1)
Sodium: 145 mmol/L (ref 135–145)

## 2018-02-09 LAB — RESPIRATORY PANEL BY PCR
Adenovirus: NOT DETECTED
Bordetella pertussis: NOT DETECTED
CORONAVIRUS OC43-RVPPCR: NOT DETECTED
Chlamydophila pneumoniae: NOT DETECTED
Coronavirus 229E: NOT DETECTED
Coronavirus HKU1: NOT DETECTED
Coronavirus NL63: NOT DETECTED
Influenza A: NOT DETECTED
Influenza B: NOT DETECTED
MYCOPLASMA PNEUMONIAE-RVPPCR: NOT DETECTED
Metapneumovirus: NOT DETECTED
Parainfluenza Virus 1: NOT DETECTED
Parainfluenza Virus 2: NOT DETECTED
Parainfluenza Virus 3: NOT DETECTED
Parainfluenza Virus 4: NOT DETECTED
Respiratory Syncytial Virus: NOT DETECTED
Rhinovirus / Enterovirus: NOT DETECTED

## 2018-02-09 LAB — CK: Total CK: 44 U/L — ABNORMAL LOW (ref 49–397)

## 2018-02-09 LAB — URINALYSIS, ROUTINE W REFLEX MICROSCOPIC
Glucose, UA: NEGATIVE mg/dL
Hgb urine dipstick: NEGATIVE
Ketones, ur: 5 mg/dL — AB
Leukocytes, UA: NEGATIVE
Nitrite: NEGATIVE
Protein, ur: NEGATIVE mg/dL
SPECIFIC GRAVITY, URINE: 1.027 (ref 1.005–1.030)
pH: 5 (ref 5.0–8.0)

## 2018-02-09 LAB — MAGNESIUM: MAGNESIUM: 1.7 mg/dL (ref 1.7–2.4)

## 2018-02-09 LAB — CBC
HCT: 41.4 % (ref 39.0–52.0)
Hemoglobin: 12.7 g/dL — ABNORMAL LOW (ref 13.0–17.0)
MCH: 29.9 pg (ref 26.0–34.0)
MCHC: 30.7 g/dL (ref 30.0–36.0)
MCV: 97.4 fL (ref 80.0–100.0)
Platelets: 190 10*3/uL (ref 150–400)
RBC: 4.25 MIL/uL (ref 4.22–5.81)
RDW: 17 % — ABNORMAL HIGH (ref 11.5–15.5)
WBC: 10.6 10*3/uL — ABNORMAL HIGH (ref 4.0–10.5)
nRBC: 0 % (ref 0.0–0.2)

## 2018-02-09 LAB — POCT I-STAT 3, ART BLOOD GAS (G3+)
ACID-BASE DEFICIT: 3 mmol/L — AB (ref 0.0–2.0)
Bicarbonate: 23.1 mmol/L (ref 20.0–28.0)
O2 Saturation: 96 %
PO2 ART: 90 mmHg (ref 83.0–108.0)
TCO2: 25 mmol/L (ref 22–32)
pCO2 arterial: 46.7 mmHg (ref 32.0–48.0)
pH, Arterial: 7.302 — ABNORMAL LOW (ref 7.350–7.450)

## 2018-02-09 LAB — COMPREHENSIVE METABOLIC PANEL
ALT: 8 U/L (ref 0–44)
ANION GAP: 9 (ref 5–15)
AST: 17 U/L (ref 15–41)
Albumin: 2.1 g/dL — ABNORMAL LOW (ref 3.5–5.0)
Alkaline Phosphatase: 38 U/L (ref 38–126)
BUN: 28 mg/dL — ABNORMAL HIGH (ref 8–23)
CO2: 24 mmol/L (ref 22–32)
Calcium: 8.6 mg/dL — ABNORMAL LOW (ref 8.9–10.3)
Chloride: 111 mmol/L (ref 98–111)
Creatinine, Ser: 1.69 mg/dL — ABNORMAL HIGH (ref 0.61–1.24)
GFR calc Af Amer: 47 mL/min — ABNORMAL LOW (ref >60)
GFR calc non Af Amer: 41 mL/min — ABNORMAL LOW (ref >60)
GLUCOSE: 111 mg/dL — AB (ref 70–99)
Potassium: 3.9 mmol/L (ref 3.5–5.1)
Sodium: 144 mmol/L (ref 135–145)
TOTAL PROTEIN: 5 g/dL — AB (ref 6.5–8.1)
Total Bilirubin: 0.9 mg/dL (ref 0.3–1.2)

## 2018-02-09 LAB — GLUCOSE, CAPILLARY
GLUCOSE-CAPILLARY: 89 mg/dL (ref 70–99)
GLUCOSE-CAPILLARY: 88 mg/dL (ref 70–99)
Glucose-Capillary: 105 mg/dL — ABNORMAL HIGH (ref 70–99)
Glucose-Capillary: 152 mg/dL — ABNORMAL HIGH (ref 70–99)
Glucose-Capillary: 97 mg/dL (ref 70–99)

## 2018-02-09 LAB — RAPID URINE DRUG SCREEN, HOSP PERFORMED
Amphetamines: NOT DETECTED
Barbiturates: NOT DETECTED
Benzodiazepines: POSITIVE — AB
Cocaine: NOT DETECTED
Opiates: NOT DETECTED
Tetrahydrocannabinol: NOT DETECTED

## 2018-02-09 LAB — MRSA PCR SCREENING: MRSA by PCR: NEGATIVE

## 2018-02-09 LAB — INFLUENZA PANEL BY PCR (TYPE A & B)
Influenza A By PCR: NEGATIVE
Influenza B By PCR: NEGATIVE

## 2018-02-09 LAB — VALPROIC ACID LEVEL: Valproic Acid Lvl: 20 ug/mL — ABNORMAL LOW (ref 50.0–100.0)

## 2018-02-09 LAB — PROCALCITONIN: Procalcitonin: 4.38 ng/mL

## 2018-02-09 LAB — CORTISOL: CORTISOL PLASMA: 56.6 ug/dL

## 2018-02-09 LAB — STREP PNEUMONIAE URINARY ANTIGEN: STREP PNEUMO URINARY ANTIGEN: POSITIVE — AB

## 2018-02-09 MED ORDER — LACTATED RINGERS IV SOLN
INTRAVENOUS | Status: DC
  Administered 2018-02-09 – 2018-02-10 (×3): via INTRAVENOUS

## 2018-02-09 MED ORDER — METHYLPREDNISOLONE SODIUM SUCC 40 MG IJ SOLR
20.0000 mg | Freq: Every day | INTRAMUSCULAR | Status: DC
  Administered 2018-02-09 – 2018-02-10 (×2): 20 mg via INTRAVENOUS
  Filled 2018-02-09: qty 0.5
  Filled 2018-02-09: qty 1

## 2018-02-09 MED ORDER — VALPROATE SODIUM 500 MG/5ML IV SOLN
500.0000 mg | Freq: Three times a day (TID) | INTRAVENOUS | Status: DC
  Administered 2018-02-09 – 2018-02-14 (×15): 500 mg via INTRAVENOUS
  Filled 2018-02-09 (×16): qty 5

## 2018-02-09 MED ORDER — BUDESONIDE 0.5 MG/2ML IN SUSP
0.5000 mg | Freq: Two times a day (BID) | RESPIRATORY_TRACT | Status: DC
  Administered 2018-02-09 – 2018-02-19 (×21): 0.5 mg via RESPIRATORY_TRACT
  Filled 2018-02-09 (×22): qty 2

## 2018-02-09 MED ORDER — FAMOTIDINE IN NACL 20-0.9 MG/50ML-% IV SOLN
20.0000 mg | Freq: Every day | INTRAVENOUS | Status: DC
  Administered 2018-02-09 – 2018-02-16 (×8): 20 mg via INTRAVENOUS
  Filled 2018-02-09 (×9): qty 50

## 2018-02-09 MED ORDER — LACTATED RINGERS IV BOLUS
500.0000 mL | Freq: Once | INTRAVENOUS | Status: AC
  Administered 2018-02-09: 500 mL via INTRAVENOUS

## 2018-02-09 MED ORDER — PIPERACILLIN-TAZOBACTAM 3.375 G IVPB
3.3750 g | Freq: Three times a day (TID) | INTRAVENOUS | Status: DC
  Administered 2018-02-09: 3.375 g via INTRAVENOUS
  Filled 2018-02-09 (×2): qty 50

## 2018-02-09 MED ORDER — SODIUM CHLORIDE 0.9 % IV SOLN
2.0000 g | INTRAVENOUS | Status: DC
  Administered 2018-02-09 – 2018-02-11 (×3): 2 g via INTRAVENOUS
  Filled 2018-02-09 (×4): qty 20

## 2018-02-09 MED ORDER — VANCOMYCIN HCL 10 G IV SOLR
1250.0000 mg | INTRAVENOUS | Status: DC
  Filled 2018-02-09: qty 1250

## 2018-02-09 MED ORDER — ARFORMOTEROL TARTRATE 15 MCG/2ML IN NEBU
15.0000 ug | INHALATION_SOLUTION | Freq: Two times a day (BID) | RESPIRATORY_TRACT | Status: DC
  Administered 2018-02-09 – 2018-02-19 (×21): 15 ug via RESPIRATORY_TRACT
  Filled 2018-02-09 (×22): qty 2

## 2018-02-09 MED ORDER — IPRATROPIUM-ALBUTEROL 0.5-2.5 (3) MG/3ML IN SOLN
3.0000 mL | RESPIRATORY_TRACT | Status: DC | PRN
  Administered 2018-02-11 – 2018-02-19 (×3): 3 mL via RESPIRATORY_TRACT
  Filled 2018-02-09 (×4): qty 3

## 2018-02-09 MED ORDER — ORAL CARE MOUTH RINSE
15.0000 mL | Freq: Two times a day (BID) | OROMUCOSAL | Status: DC
  Administered 2018-02-09 – 2018-02-10 (×4): 15 mL via OROMUCOSAL

## 2018-02-09 MED ORDER — LEVETIRACETAM IN NACL 500 MG/100ML IV SOLN
500.0000 mg | Freq: Two times a day (BID) | INTRAVENOUS | Status: DC
  Administered 2018-02-09 – 2018-02-13 (×10): 500 mg via INTRAVENOUS
  Filled 2018-02-09 (×11): qty 100

## 2018-02-09 MED ORDER — VALPROATE SODIUM 500 MG/5ML IV SOLN
1250.0000 mg | Freq: Once | INTRAVENOUS | Status: AC
  Administered 2018-02-09: 1250 mg via INTRAVENOUS
  Filled 2018-02-09: qty 12.5

## 2018-02-09 MED ORDER — NOREPINEPHRINE BITARTRATE 1 MG/ML IV SOLN
0.0000 ug/min | INTRAVENOUS | Status: DC
  Administered 2018-02-09: 2 ug/min via INTRAVENOUS
  Administered 2018-02-09: 5 ug/min via INTRAVENOUS
  Filled 2018-02-09 (×2): qty 4

## 2018-02-09 MED ORDER — CHLORHEXIDINE GLUCONATE 0.12 % MT SOLN
15.0000 mL | Freq: Two times a day (BID) | OROMUCOSAL | Status: DC
  Administered 2018-02-09 (×2): 15 mL via OROMUCOSAL
  Filled 2018-02-09: qty 15

## 2018-02-09 MED ORDER — SODIUM CHLORIDE 0.9 % IV BOLUS
1000.0000 mL | Freq: Once | INTRAVENOUS | Status: AC
  Administered 2018-02-09: 1000 mL via INTRAVENOUS

## 2018-02-09 MED ORDER — SODIUM CHLORIDE 0.9 % IV SOLN
INTRAVENOUS | Status: DC | PRN
  Administered 2018-02-09 – 2018-02-16 (×4): via INTRAVENOUS
  Administered 2018-02-18: 240 mL via INTRAVENOUS

## 2018-02-09 NOTE — H&P (Signed)
NAME:  Patrick Holt, MRN:  [id number], DOB:  1950-08-09, LOS: 0 ADMISSION DATE:  01/28/2018, CONSULTATION DATE:  02/09/2018 REFERRING MD:  [name] Carnes, PA, CHIEF COMPLAINT:  Altered mental status  Brief History   20 yoM w/extensive hx presenting from SNF with AMS, hypotensive, hypoxic, and warm.  Had chewed food in mouth.  Placed on Bipap and peripheral levophed after 3L NS.  PCCM called for admit.  History of present illness   HPI obtained from medical chart review as patient is encephalopathic.    67 year old with extensive PMH significant for afib on eliquis, diastolic HF, COPD, seizures, sarcoidosis, RA, biopolar and conversion disorder presenting from [facility] for altered mental status.    On arrival to ER, patient was warm to the tough, hypotensive with SBP in the 70's and hypoxic in the low 80's with increased work of breathing.  He was found to have chewed food in his mouth.  ABG 7.327/ 46.8/ 62/ 24.4.  He was started on BiPAP with improvement in his mental status.  Blood and urine cultures sent and started on vancomycin and zosyn.  Labs showing WBC 8.1 with mild left shift, lactic 1.64, sCr 1.69, PCT 4.38, valproic acid 20, glucose 111, flu negative, CXR without obvious infiltrate, UA neg.  Patient given 3L NS and then placed on peripheral levophed.  PCCM called for admission.   Past Medical History  Anemia, Anxiety, Asthma, Atrial fibrillation on Eliquis [facility]) (01/2012), Bipolar 1 disorder ([place]), Cancer ([place]), Collagen vascular disease ([place]), Conversion disorder (06/2010), COPD (chronic obstructive pulmonary disease) ([place]), CVA (cerebral vascular accident) ([place]), Dermatomyositis ([place]), Dermatomyositis ([place]), Diverticulitis, Diverticulosis of colon, Dysrhythmia, Esophageal dysmotility, Esophageal stricture, Fibromyalgia, Gastritis, GERD (gastroesophageal reflux disease), Hand fracture, right, Headache(784.0), Hiatal hernia, History of narcotic addiction ([place]), adenomatous colonic  polyps, Hyperlipidemia, Hypertension, Internal hemorrhoids, Ischemic heart disease, Major depression, Myocardial infarct ([place]), Narcotic dependence ([place]), Nephrolithiasis, Obesity, OCD (obsessive compulsive disorder), Otosclerosis, Paroxysmal A-fib ([place]), Peripheral neuropathy, Raynaud's disease, Renal insufficiency, Rheumatoid arthritis(714.0), Sarcoidosis, Seizures ([place]), Subarachnoid hemorrhage ([place]) (01/2012), Tremor, Type II diabetes mellitus ([place]), Urge incontinence, and Vertigo.  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    Objective   Blood pressure (!) 96/59, pulse 75, temperature 99.2 F (37.3 C), temperature source Axillary, resp. rate 17, height 5\' 9"  (1.753 m), weight 81.2 kg, SpO2 98 %.    FiO2 (%):  [80 %] 80 %   Intake/Output Summary (Last 24 hours) at 02/09/2018 0158 Last data filed at 02/09/2018 0143 Gross per 24 hour  Intake 6050.76 ml  Output -  Net 6050.76 ml   Filed Weights   01/29/2018 2319  Weight: 81.2 kg    Examination: General:  Ill appearing older male in NAD on BiPAP HEENT: Squinting eyes shut- unable to view pupils, beard with full face mask on, slight leak  Neuro: arouses to noxious stimuli then will speak a few words, intermittently f/c and fall back asleep, withdrawals from pain in all extremitites CV: s1s2 rrr,  no m/r/g PULM: even/non-labored on BiPAP 12/6, lungs bilaterally clear, no wheeze GI: obese, soft, non-tender, bs active  Extremities: warm/dry, trace LE edema  Skin: no rashes   Resolved Hospital Problem list    Assessment & Plan:  Shock- presumed septic with unclear source at this time.  Presumed aspiration PNA given hypoxia and increased WOB initially and food in mouth - UA neg, lactate normal, WBC normal, however mild left shift with positive PCT 4.38, flu neg - s/p 3L NS - EKG non-acute, no ischemic changes P:  ICU admit Continue peripheral levophed for goal MAP >65 LR 50 m/hr  Continue vanc and zosyn for now Follow culture data Trend PCT/ fever curve/ WBC Checking cortisol level given pt is steroid dependent  Acute on chronic hypoxic respiratory failure High risk for intubation  R/o Aspiration PNA  Hx COPD, tobacco abuse, sarcoid (not on home O2), also noted some component of probable pulmonary hypertension given PASP 51 on TTE 10/2017 - ddx include PE although chronically on Eliquis  P:  Continue BiPAP for now High risk for intubation if mental status deteriorates  duonebs q 4 prn [place]/ pulmicort nebs BID abx as above   Acute encephalopathy - presumed due to sepsis vs postictal from seizure as acute infection could lower his threshold, vs psych component vs CNS process  Hx seizures, conversion disorder, bipolar  - patient is following commands and MAE in ICU - which r/o SCSE, no nuchal rigidity noted on PE P:  Neurology consulted for concern of ongoing seizures, defer spot EEG vs LTM to them Load with IV Depakote 15 mg/kg once, then 400 mg TID and keppra 500 mg BID per neurology recommendations Seizure precautions Frequent neuro checks Check UDS Holding home xanax, sinemet, Risperdal   AKI P:  IVF as above Check CK level  Monitor UOP Trend renal function, check mag  Prolonged QTc Afib on Eliquis - currently rate controlled P:  Tele monitor  Avoid QTc  meds Holding home amiodarone, inderal Checking mag Holding Eliquis for now, when more alert, consider restarting vs transitioning to heparin gtt  Hyperglycemia P:  CBG q 4 Add SSI if > 180   RA/ sarcoidosis P  Hold prednisone while NPO IV solumedrol daily    HLD P:  Resume lipitor when mental status improves  Protein calorie malnutrition with previous failure to thrive P:  Maximize nutrition as able when mental status improves  Best practice:  Diet: NPO Pain/Anxiety/Delirium protocol (if indicated): n/a VAP protocol (if indicated): n/a DVT prophylaxis: SCDs for now, will need to restart eliquis vs heparin GI prophylaxis: pepcid Glucose control: cbg q 4, add if needed Mobility: BR Code Status: Full, consider PMT consult given extensive underlying health conditions including failure to thrive Family Communication: no family w/patient Disposition: ICU  Labs   CBC: Recent Labs  Lab 02/10/2018 2330  WBC 8.1  NEUTROABS 6.3  HGB 14.1  HCT 44.6  MCV 96.3  PLT 712    Basic Metabolic Panel: Recent Labs  Lab 01/23/2018 2330  NA 144  K 3.9  CL 111  CO2 24  GLUCOSE 111*  BUN 28*  CREATININE 1.69*  CALCIUM 8.6*   GFR: Estimated Creatinine Clearance: 41.8 mL/min (A) (by C-G formula based on SCr of 1.69 mg/dL (H)). Recent Labs  Lab 01/15/2018 2330 01/24/2018 2342  PROCALCITON 4.38  --  WBC 8.1  --   LATICACIDVEN  --  1.64    Liver Function Tests: Recent Labs  Lab 02/13/2018 2330  AST 17  ALT 8  ALKPHOS 38  BILITOT 0.9  PROT 5.0*  ALBUMIN 2.1*   No results for input(s): LIPASE, AMYLASE in the last 168 hours. No results for input(s): AMMONIA in the last 168 hours.  ABG    Component Value Date/Time   PHART 7.311 (L) 02/09/2018 0153   PCO2ART 47.4 02/09/2018 0153   PO2ART 79.0 (L) 02/09/2018 0153   HCO3 23.8 02/09/2018 0153   TCO2 25 02/09/2018 0153   ACIDBASEDEF 3.0 (H) 02/09/2018 0153   O2SAT 94.0 02/09/2018 0153     Coagulation Profile: No  results for input(s): INR, PROTIME in the last 168 hours.  Cardiac Enzymes: No results for input(s): CKTOTAL, CKMB, CKMBINDEX, TROPONINI in the last 168 hours.  HbA1C: Hgb A1c MFr Bld  Date/Time Value Ref Range Status  11/01/2017 02:47 PM 5.4 4.8 - 5.6 % Final    Comment:    (NOTE)         Prediabetes: 5.7 - 6.4         Diabetes: >6.4         Glycemic control for adults with diabetes: <7.0   01/14/2017 06:14 AM 5.9 (H) 4.8 - 5.6 % Final    Comment:    (NOTE) Pre diabetes:          5.7%-6.4% Diabetes:              >6.4% Glycemic control for   <7.0% adults with diabetes     CBG: No results for input(s): GLUCAP in the last 168 hours.  Review of Systems:   unable  Past Medical History  He,  has a past medical history of Anemia, Anxiety, Asthma, Atrial fibrillation ([place]) (01/2012), Bipolar 1 disorder ([place]), Cancer ([place]), Collagen vascular disease ([place]), Conversion disorder (06/2010), COPD (chronic obstructive pulmonary disease) ([place]), CVA (cerebral vascular accident) ([place]), Dermatomyositis ([place]), Dermatomyositis ([place]), Diverticulitis, Diverticulosis of colon, Dysrhythmia, Esophageal dysmotility, Esophageal stricture, Fibromyalgia, Gastritis, GERD (gastroesophageal reflux disease), Hand fracture, right, Headache(784.0), Hiatal hernia, History of narcotic addiction ([place]), adenomatous colonic polyps, Hyperlipidemia, Hypertension, Internal hemorrhoids, Ischemic heart disease, Major depression, Myocardial infarct ([place]), Narcotic dependence ([place]), Nephrolithiasis, Obesity, OCD (obsessive compulsive disorder), Otosclerosis, Paroxysmal A-fib ([place]), Peripheral neuropathy, Raynaud's disease, Renal insufficiency, Rheumatoid arthritis(714.0), Sarcoidosis, Seizures ([place]), Subarachnoid hemorrhage ([place]) (01/2012), Tremor, Type II diabetes mellitus ([place]), Urge incontinence, and Vertigo.   Surgical History    Past Surgical History:  Procedure Laterality Date  . BACK SURGERY    . CARDIAC  CATHETERIZATION    . CARPAL TUNNEL RELEASE Bilateral   . CATARACT EXTRACTION W/ INTRAOCULAR LENS IMPLANT Left   . COLONOSCOPY N/A 09/27/2012   Procedure: COLONOSCOPY;  Surgeon: Lafayette Dragon, MD;  Location: [facility] ENDOSCOPY;  Service: Endoscopy;  Laterality: N/A;  . ESOPHAGOGASTRODUODENOSCOPY N/A 09/27/2012   Procedure: ESOPHAGOGASTRODUODENOSCOPY (EGD);  Surgeon: Lafayette Dragon, MD;  Location: [facility];  Service: Endoscopy;  Laterality: N/A;  . KNEE ARTHROSCOPY W/ MENISCAL REPAIR Left 2009  . LUMBAR DISC SURGERY    . LYMPH NODE DISSECTION Right 1970's   "neck; dr thought I had Hodgkins; turned out to be sarcoidosis" (11/15/2012)  . squamous papilloma   2010   removed by Dr. Constance Holster ENT, noted on tongue  . TONSILLECTOMY       Social History   reports that he quit smoking about 3 months ago. His smoking use included cigarettes. He  has a 15.00 pack-year smoking history. He has never used smokeless tobacco. He reports that he does not drink alcohol or use drugs.   Family History   His family history includes Alcohol abuse in his mother; Coronary artery disease in his father; Depression in his mother; Diabetes in his mother and another family member; Heart disease in his mother; Hepatitis in his brother; Stroke in his mother.   Allergies Allergies  Allergen Reactions  . Immune Globulins Other (See Comments)    Acute renal failure  . Rho (D) Immune Globulin     Acute renal failure Acute renal failure  . Ciprofloxacin Swelling  . Flagyl [Metronidazole] Swelling  . Lisinopril Diarrhea  . Sulfa Antibiotics Other (See Comments)    blisters  . Sulfasalazine     blisters blisters  . Cefepime Other (See Comments)    Diffuse maculopapular rash when receiving both vanc and cefepime, timing unclear.  . Morphine And Related Other (See Comments)    Ineffective for pain management  . Requip [Ropinirole Hcl] Other (See Comments)    constipation  . Vancomycin     Diffuse maculopapular rash when  receiving both vanc/cefepime.  Timing unclear.    . Betamethasone Dipropionate Other (See Comments)    Unknown  . Betamethasone Dipropionate     Unknown  . Bupropion     Unknown  . Bupropion Hcl Other (See Comments)    Unknown  . Clobetasol Other (See Comments)    Unknown Unknown  . Codeine Other (See Comments)    Does not help with pain  . Escitalopram Oxalate Other (See Comments)    Unknown  . Escitalopram Oxalate     Unknown  . Fluoxetine Hcl Other (See Comments)    Unknown  . Fluoxetine Hcl     Unknown  . Furosemide Other (See Comments)    Unknown Unknown  . Meclizine Rash  . Paroxetine Other (See Comments)    Unknown  . Penicillins Other (See Comments)    Unknown reaction Has patient had a PCN reaction causing immediate rash, facial/tongue/throat swelling, SOB or lightheadedness with hypotension: unknown Has patient had a PCN reaction causing severe rash involving mucus membranes or skin necrosis: unknown Has patient had a PCN reaction that required hospitalization unknown Has patient had a PCN reaction occurring within the last 10 years: unknown If all of the above answers are "NO", then may proceed with Cephalosporin use. Unknown Unknown reactio  . Tacrolimus Other (See Comments)    Unknown  . Tetanus Toxoid Other (See Comments)    Unknown  . Tuberculin Purified Protein Derivative Other (See Comments)    Unknown     Home Medications  Prior to Admission medications   Medication Sig Start Date End Date Taking? Authorizing Provider  acetaminophen (TYLENOL) 325 MG tablet Take 2 tablets (650 mg total) by mouth every 6 (six) hours as needed for mild pain (or Fever >/= 101). 11/04/17  Yes Debbe Odea, MD  ALPRAZolam Duanne Moron) 0.5 MG tablet Take 1 tablet (0.5 mg total) by mouth 3 (three) times daily. Patient taking differently: Take 0.5 mg by mouth 2 (two) times daily.  10/14/17  Yes Elgergawy, Silver Huguenin, MD  amiodarone (PACERONE) 400 MG tablet Take 1 tablet (400 mg  total) by mouth daily. 11/11/17  Yes Sheikh, Omair Latif, DO  apixaban (ELIQUIS) 5 MG TABS tablet Take 10 mg oral twice daily for total of  days, then transition to 5 mg oral twice daily on 10/21/2017 Patient taking differently: Take  5 mg by mouth 2 (two) times daily.  10/18/17  Yes Elgergawy, Silver Huguenin, MD  atorvastatin (LIPITOR) 40 MG tablet Take 1 tablet (40 mg total) by mouth daily. 09/07/17  Yes Oretha Milch D, MD  benzonatate (TESSALON) 100 MG capsule Take 1 capsule (100 mg total) by mouth 3 (three) times daily. 11/10/17  Yes Sheikh, Omair Latif, DO  carbidopa-levodopa (SINEMET IR) 25-100 MG tablet Take 1 tablet by mouth 2 (two) times daily.   Yes [provider]  divalproex (DEPAKOTE) 250 MG DR tablet Take 1 tablet (250 mg total) by mouth 3 (three) times daily. 09/07/17  Yes Oretha Milch D, MD  ergocalciferol (VITAMIN D2) 50000 units capsule Take 1 capsule (50,000 Units total) by mouth once a week. 09/07/17  Yes Oretha Milch D, MD  guaiFENesin (MUCINEX) 600 MG 12 hr tablet Take 2 tablets (1,200 mg total) by mouth 2 (two) times daily. 10/18/17  Yes Elgergawy, Silver Huguenin, MD  ipratropium-albuterol (DUONEB) 0.5-2.5 (3) MG/3ML SOLN Inhale 3 mLs into the lungs every 6 (six) hours as needed. Patient taking differently: Inhale 3 mLs into the lungs every 6 (six) hours as needed (wheezing).  09/07/17  Yes Oretha Milch D, MD  levETIRAcetam (KEPPRA) 500 MG tablet Take 1 tablet (500 mg total) by mouth 2 (two) times daily. 09/07/17  Yes Oretha Milch D, MD  Lidocaine 4 % PTCH Apply 1 patch topically daily. To each shoulder.  Remove after 12 hours   Yes [provider]  loperamide (IMODIUM A-D) 2 MG tablet Take 2 mg by mouth as needed for diarrhea or loose stools.   Yes [provider]  Multiple Vitamin (MULTIVITAMIN WITH MINERALS) TABS tablet Take 1 tablet by mouth daily. 09/07/17  Yes Oretha Milch D, MD  Nutritional Supplements (RESOURCE 2.0) LIQD Take 90 mLs by mouth 3 (three) times  daily between meals.   Yes [provider]  nystatin (MYCOSTATIN) 100000 UNIT/ML suspension Take 5 mLs (500,000 Units total) by mouth 4 (four) times daily. Patient taking differently: Take 5 mLs by mouth 2 (two) times daily.  11/10/17  Yes Sheikh, Omair Latif, DO  omeprazole (PRILOSEC) 20 MG capsule Take 20 mg by mouth daily.   Yes [provider]  oxybutynin (DITROPAN) 5 MG tablet Take 1 tablet (5 mg total) by mouth 3 (three) times daily. 09/07/17  Yes Oretha Milch D, MD  polyethylene glycol (MIRALAX / GLYCOLAX) packet Take 17 g by mouth daily.   Yes [provider]  Potassium Chloride ER 20 MEQ TBCR Take 20 mEq by mouth every other day. 09/20/17  Yes Patrecia Pour, MD  predniSONE (DELTASONE) 2.5 MG tablet Take 2.5 mg by mouth daily with breakfast.   Yes [provider]  propranolol (INDERAL) 10 MG tablet Take 10 mg by mouth 2 (two) times daily.   Yes [provider]  risperiDONE (RISPERDAL) 1 MG tablet Take 1 tablet (1 mg total) by mouth 2 (two) times daily. 09/07/17  Yes Oretha Milch D, MD  senna-docusate (SENOKOT-S) 8.6-50 MG tablet Take 1 tablet by mouth at bedtime as needed for mild constipation. Patient taking differently: Take 1 tablet by mouth at bedtime.  09/07/17  Yes Oretha Milch D, MD  umeclidinium bromide (INCRUSE ELLIPTA) 62.5 MCG/INH AEPB Inhale 1 puff into the lungs daily.   Yes [provider]  albuterol (PROVENTIL) (2.5 MG/3ML) 0.083% nebulizer solution Take 3 mLs (2.5 mg total) by nebulization every 4 (four) hours as needed for wheezing or shortness of breath. Patient not  taking: Reported on 02/09/2018 11/10/17   Raiford Noble Latif, DO  budesonide-formoterol [facility]) 160-4.5 MCG/ACT inhaler Inhale 2 puffs into the lungs 2 (two) times daily. Patient not taking: Reported on 02/09/2018 09/07/17   Oretha Milch D, MD  esomeprazole (NEXIUM) 40 MG capsule Take 1 capsule (40 mg total) by mouth daily. Patient not taking: Reported  on 02/09/2018 09/07/17   Oretha Milch D, MD  fluconazole (DIFLUCAN) 100 MG tablet Take 1 tablet (100 mg total) by mouth daily. Patient not taking: Reported on 02/09/2018 11/11/17   Raiford Noble Latif, DO  lidocaine (LIDODERM) 5 % Place 1 patch onto the skin daily. Remove & Discard patch within 12 hours or as directed by MD Patient not taking: Reported on 02/09/2018 11/11/17   Raiford Noble Latif, DO  loperamide (IMODIUM) 2 MG capsule Take 1 capsule (2 mg total) by mouth as needed for diarrhea or loose stools. Patient not taking: Reported on 02/09/2018 09/07/17   Desiree Hane, MD  Maltodextrin-Xanthan Gum (RESOURCE THICKENUP CLEAR) POWD Take 120 g by mouth as needed. Patient not taking: Reported on 02/09/2018 11/10/17   Raiford Noble Latif, DO  nicotine (NICODERM CQ - DOSED IN MG/24 HOURS) 21 mg/24hr patch Place 1 patch (21 mg total) onto the skin daily. Patient not taking: Reported on 02/09/2018 10/15/17   Elgergawy, Silver Huguenin, MD  nutrition supplement, JUVEN, (JUVEN) PACK Take 1 packet by mouth 2 (two) times daily between meals. Patient not taking: Reported on 02/09/2018 11/04/17   Debbe Odea, MD  predniSONE (DELTASONE) 10 MG tablet Take 0.5 tablets (5 mg total) by mouth daily with breakfast. Patient not taking: Reported on 02/09/2018 10/18/17   Elgergawy, Silver Huguenin, MD     Critical care time: 4 mins    Kennieth Rad, AGACNP-BC [place] Pgr: [phone] or if no answer [phone] 02/09/2018, 5:13 AM

## 2018-02-09 NOTE — Procedures (Signed)
History: 67 year old male being evaluated for possible seizures  Sedation: Propofol  Technique: This is a 21 channel routine scalp EEG performed at the bedside with bipolar and monopolar montages arranged in accordance to the international 10/20 system of electrode placement. One channel was dedicated to EKG recording.    Background: There is a posterior dominant rhythm of 8 to 9 Hz which is well sustained.  There is also mild irregular theta activity which is generalized and present throughout the recording.  There is excess anteriorly predominant beta activity as well.  Photic stimulation: Physiologic driving is not performed  EEG Abnormalities: 1) generalized irregular slow activity  Clinical Interpretation: This EEG is consistent with a generalized nonspecific cerebral dysfunction (encephalopathy) as can be seen with sedating medications among other causes. There was no seizure or seizure predisposition recorded on this study. Please note that lack of epileptiform activity on EEG does not preclude the possibility of epilepsy.   Roland Rack, MD [facility] [phone]  If 7pm- 7am, please page neurology on call as listed in [place].

## 2018-02-09 NOTE — Consult Note (Signed)
NEURO HOSPITALIST CONSULT NOTE   Requestig physician: Dr. Milon Dikes  Reason for Consult: Seizures  History obtained from:  ICU Team and Chart    HPI:                                                                                                                                          Patrick Holt is an 67 y.o. male with a history of seizures presenting from his SNF with hypotension, hypoxia, fever and AMS. He was noted to have some chewed food in his mouth by ICU team. When he arrived in the ED he was warm to touch, hypoxic and hypotensive with SBP in the 70's. Increased work of breathing was noted. His mental status improved after BiPAP was started. He was started on empiric ABX. WBC was 8.1 with a mild left shift.   His seizures are controlled at the SNF with valproic acid and Keppra. His valproic acid level during initial ED assessment was subtherapeutic at 20.   His PMHx includes atrial fibrillation on Eliquis, CHF, COPD, sarcoidosis, bipolar disorder and conversion disorder.   On arrival to the ICU the patient was awake and following commands. Later, he had a seizure-like episode with rightward gaze, eye fluttering and RUE tremor; there was concurrent brief change in his EKG. Neurology was called to further evaluate.   Past Medical History:  Diagnosis Date  . Anemia   . Anxiety   . Asthma   . Atrial fibrillation ([place]) 01/2012   with RVR  . Bipolar 1 disorder ([place])   . Cancer ([place])   . Collagen vascular disease ([place])   . Conversion disorder 06/2010  . COPD (chronic obstructive pulmonary disease) ([place])   . CVA (cerebral vascular accident) ([place])   . Dermatomyositis ([place])   . Dermatomyositis ([place])   . Diverticulitis   . Diverticulosis of colon   . Dysrhythmia    "irregular" (11/15/2012)  . Esophageal dysmotility   . Esophageal stricture   . Fibromyalgia   . Gastritis   . GERD (gastroesophageal reflux disease)   . Hand fracture, right    sept 13,  2017, still wearing splint as og 01-10-2017  . Headache(784.0)    "severe; get them often" (11/15/2012)  . Hiatal hernia   . History of narcotic addiction ([place])   . Hx of adenomatous colonic polyps   . Hyperlipidemia   . Hypertension   . Internal hemorrhoids   . Ischemic heart disease   . Major depression    with acute psychotic break in 06/2010  . Myocardial infarct ([place])    mulitple (1999, 2000, 2004)  . Narcotic dependence ([place])   . Nephrolithiasis   . Obesity   . OCD (obsessive compulsive disorder)   . Otosclerosis   . Paroxysmal  A-fib ([place])   . Peripheral neuropathy   . Raynaud's disease   . Renal insufficiency   . Rheumatoid arthritis(714.0)   . Sarcoidosis   . Seizures ([place])   . Subarachnoid hemorrhage ([place]) 01/2012   with subdural  hematoma.   . Tremor   . Type II diabetes mellitus ([place])   . Urge incontinence   . Vertigo     Past Surgical History:  Procedure Laterality Date  . BACK SURGERY    . CARDIAC CATHETERIZATION    . CARPAL TUNNEL RELEASE Bilateral   . CATARACT EXTRACTION W/ INTRAOCULAR LENS IMPLANT Left   . COLONOSCOPY N/A 09/27/2012   Procedure: COLONOSCOPY;  Surgeon: Lafayette Dragon, MD;  Location: [facility]COPY;  Service: Endoscopy;  Laterality: N/A;  . ESOPHAGOGASTRODUODENOSCOPY N/A 09/27/2012   Procedure: ESOPHAGOGASTRODUODENOSCOPY (EGD);  Surgeon: Lafayette Dragon, MD;  Location: [facility];  Service: Endoscopy;  Laterality: N/A;  . KNEE ARTHROSCOPY W/ MENISCAL REPAIR Left 2009  . LUMBAR DISC SURGERY    . LYMPH NODE DISSECTION Right 1970's   "neck; dr thought I had Hodgkins; turned out to be sarcoidosis" (11/15/2012)  . squamous papilloma   2010   removed by Dr. Constance Holster ENT, noted on tongue  . TONSILLECTOMY      Family History  Problem Relation Age of Onset  . Alcohol abuse Mother   . Depression Mother   . Heart disease Mother   . Diabetes Mother   . Stroke Mother   . Coronary artery disease Father   . Diabetes Other        1/2 brother  .  Hepatitis Brother               Social History:  reports that he quit smoking about 3 months ago. His smoking use included cigarettes. He has a 15.00 pack-year smoking history. He has never used smokeless tobacco. He reports that he does not drink alcohol or use drugs.  Allergies  Allergen Reactions  . Immune Globulins Other (See Comments)    Acute renal failure  . Rho (D) Immune Globulin     Acute renal failure Acute renal failure  . Ciprofloxacin Swelling  . Flagyl [Metronidazole] Swelling  . Lisinopril Diarrhea  . Sulfa Antibiotics Other (See Comments)    blisters  . Sulfasalazine     blisters blisters  . Cefepime Other (See Comments)    Diffuse maculopapular rash when receiving both vanc and cefepime, timing unclear.  . Morphine And Related Other (See Comments)    Ineffective for pain management  . Requip [Ropinirole Hcl] Other (See Comments)    constipation  . Vancomycin     Diffuse maculopapular rash when receiving both vanc/cefepime.  Timing unclear.    . Betamethasone Dipropionate Other (See Comments)    Unknown  . Betamethasone Dipropionate     Unknown  . Bupropion     Unknown  . Bupropion Hcl Other (See Comments)    Unknown  . Clobetasol Other (See Comments)    Unknown Unknown  . Codeine Other (See Comments)    Does not help with pain  . Escitalopram Oxalate Other (See Comments)    Unknown  . Escitalopram Oxalate     Unknown  . Fluoxetine Hcl Other (See Comments)    Unknown  . Fluoxetine Hcl     Unknown  . Furosemide Other (See Comments)    Unknown Unknown  . Meclizine Rash  . Paroxetine Other (See Comments)    Unknown  . Penicillins Other (See  Comments)    Unknown reaction Has patient had a PCN reaction causing immediate rash, facial/tongue/throat swelling, SOB or lightheadedness with hypotension: unknown Has patient had a PCN reaction causing severe rash involving mucus membranes or skin necrosis: unknown Has patient had a PCN reaction that  required hospitalization unknown Has patient had a PCN reaction occurring within the last 10 years: unknown If all of the above answers are "NO", then may proceed with Cephalosporin use. Unknown Unknown reactio  . Tacrolimus Other (See Comments)    Unknown  . Tetanus Toxoid Other (See Comments)    Unknown  . Tuberculin Purified Protein Derivative Other (See Comments)    Unknown    MEDICATIONS:                                                                                                                     Prior to Admission:  Medications Prior to Admission  Medication Sig Dispense Refill Last Dose  . acetaminophen (TYLENOL) 325 MG tablet Take 2 tablets (650 mg total) by mouth every 6 (six) hours as needed for mild pain (or Fever >/= 101).   01/19/2018 at Unknown time  . ALPRAZolam (XANAX) 0.5 MG tablet Take 1 tablet (0.5 mg total) by mouth 3 (three) times daily. (Patient taking differently: Take 0.5 mg by mouth 2 (two) times daily. ) 12 tablet 0 02/07/2018 at Unknown time  . amiodarone (PACERONE) 400 MG tablet Take 1 tablet (400 mg total) by mouth daily. 30 tablet 0 02/11/2018 at Unknown time  . apixaban (ELIQUIS) 5 MG TABS tablet Take 10 mg oral twice daily for total of  days, then transition to 5 mg oral twice daily on 10/21/2017 (Patient taking differently: Take 5 mg by mouth 2 (two) times daily. ) 60 tablet  02/04/2018 at 1700  . atorvastatin (LIPITOR) 40 MG tablet Take 1 tablet (40 mg total) by mouth daily. 30 tablet 0 01/29/2018 at Unknown time  . benzonatate (TESSALON) 100 MG capsule Take 1 capsule (100 mg total) by mouth 3 (three) times daily. 20 capsule 0 02/03/2018 at Unknown time  . carbidopa-levodopa (SINEMET IR) 25-100 MG tablet Take 1 tablet by mouth 2 (two) times daily.   01/25/2018 at Unknown time  . divalproex (DEPAKOTE) 250 MG DR tablet Take 1 tablet (250 mg total) by mouth 3 (three) times daily.   02/04/2018 at Unknown time  . ergocalciferol (VITAMIN D2) 50000 units  capsule Take 1 capsule (50,000 Units total) by mouth once a week.   01/16/2018 at Unknown time  . guaiFENesin (MUCINEX) 600 MG 12 hr tablet Take 2 tablets (1,200 mg total) by mouth 2 (two) times daily.   01/19/2018 at Unknown time  . ipratropium-albuterol (DUONEB) 0.5-2.5 (3) MG/3ML SOLN Inhale 3 mLs into the lungs every 6 (six) hours as needed. (Patient taking differently: Inhale 3 mLs into the lungs every 6 (six) hours as needed (wheezing). ) 360 mL  01/24/2018 at Unknown time  . levETIRAcetam (KEPPRA) 500 MG tablet Take 1 tablet (500 mg  total) by mouth 2 (two) times daily.   01/16/2018 at Unknown time  . Lidocaine 4 % PTCH Apply 1 patch topically daily. To each shoulder.  Remove after 12 hours   01/24/2018 at Unknown time  . loperamide (IMODIUM A-D) 2 MG tablet Take 2 mg by mouth as needed for diarrhea or loose stools.   unknown  . Multiple Vitamin (MULTIVITAMIN WITH MINERALS) TABS tablet Take 1 tablet by mouth daily. 90 tablet 3 02/04/2018 at Unknown time  . Nutritional Supplements (RESOURCE 2.0) LIQD Take 90 mLs by mouth 3 (three) times daily between meals.   02/12/2018 at Unknown time  . nystatin (MYCOSTATIN) 100000 UNIT/ML suspension Take 5 mLs (500,000 Units total) by mouth 4 (four) times daily. (Patient taking differently: Take 5 mLs by mouth 2 (two) times daily. ) 60 mL 0 02/04/2018 at Unknown time  . omeprazole (PRILOSEC) 20 MG capsule Take 20 mg by mouth daily.   01/30/2018 at Unknown time  . oxybutynin (DITROPAN) 5 MG tablet Take 1 tablet (5 mg total) by mouth 3 (three) times daily.   01/25/2018 at Unknown time  . polyethylene glycol (MIRALAX / GLYCOLAX) packet Take 17 g by mouth daily.   01/22/2018 at Unknown time  . Potassium Chloride ER 20 MEQ TBCR Take 20 mEq by mouth every other day.   02/01/2018 at Unknown time  . predniSONE (DELTASONE) 2.5 MG tablet Take 2.5 mg by mouth daily with breakfast.   01/30/2018 at Unknown time  . propranolol (INDERAL) 10 MG tablet Take 10 mg by mouth 2  (two) times daily.   01/31/2018 at 1700  . risperiDONE (RISPERDAL) 1 MG tablet Take 1 tablet (1 mg total) by mouth 2 (two) times daily.   02/09/2018 at Unknown time  . senna-docusate (SENOKOT-S) 8.6-50 MG tablet Take 1 tablet by mouth at bedtime as needed for mild constipation. (Patient taking differently: Take 1 tablet by mouth at bedtime. )   02/14/2018 at Unknown time  . umeclidinium bromide (INCRUSE ELLIPTA) 62.5 MCG/INH AEPB Inhale 1 puff into the lungs daily.   02/12/2018 at Unknown time  . albuterol (PROVENTIL) (2.5 MG/3ML) 0.083% nebulizer solution Take 3 mLs (2.5 mg total) by nebulization every 4 (four) hours as needed for wheezing or shortness of breath. (Patient not taking: Reported on 02/09/2018) 75 mL 12 Not Taking at Unknown time  . budesonide-formoterol (SYMBICORT) 160-4.5 MCG/ACT inhaler Inhale 2 puffs into the lungs 2 (two) times daily. (Patient not taking: Reported on 02/09/2018) 1 Inhaler 0 Not Taking at Unknown time  . esomeprazole (NEXIUM) 40 MG capsule Take 1 capsule (40 mg total) by mouth daily. (Patient not taking: Reported on 02/09/2018)   Not Taking at Unknown time  . fluconazole (DIFLUCAN) 100 MG tablet Take 1 tablet (100 mg total) by mouth daily. (Patient not taking: Reported on 02/09/2018) 5 tablet 0 Not Taking at Unknown time  . lidocaine (LIDODERM) 5 % Place 1 patch onto the skin daily. Remove & Discard patch within 12 hours or as directed by MD (Patient not taking: Reported on 02/09/2018) 30 patch 0 Not Taking at Unknown time  . loperamide (IMODIUM) 2 MG capsule Take 1 capsule (2 mg total) by mouth as needed for diarrhea or loose stools. (Patient not taking: Reported on 02/09/2018) 30 capsule 0 Not Taking at Unknown time  . Maltodextrin-Xanthan Gum (RESOURCE THICKENUP CLEAR) POWD Take 120 g by mouth as needed. (Patient not taking: Reported on 02/09/2018) 1 Can 0 Not Taking at Unknown time  . nicotine (NICODERM CQ -  DOSED IN MG/24 HOURS) 21 mg/24hr patch Place 1 patch (21 mg  total) onto the skin daily. (Patient not taking: Reported on 02/09/2018) 28 patch 0 Not Taking at Unknown time  . nutrition supplement, JUVEN, (JUVEN) PACK Take 1 packet by mouth 2 (two) times daily between meals. (Patient not taking: Reported on 02/09/2018)  0 Not Taking at Unknown time  . predniSONE (DELTASONE) 10 MG tablet Take 0.5 tablets (5 mg total) by mouth daily with breakfast. (Patient not taking: Reported on 02/09/2018)   Not Taking at Unknown time   Scheduled: . arformoterol  15 mcg Nebulization BID  . budesonide (PULMICORT) nebulizer solution  0.5 mg Nebulization BID  . chlorhexidine  15 mL Mouth Rinse BID  . mouth rinse  15 mL Mouth Rinse q12n4p  . methylPREDNISolone (SOLU-MEDROL) injection  20 mg Intravenous Daily   Continuous: . sodium chloride Stopped (02/09/18 0405)  . famotidine (PEPCID) IV    . lactated ringers 50 mL/hr at 02/09/18 0500  . levETIRAcetam 500 mg (02/09/18 0513)  . norepinephrine (LEVOPHED) Adult infusion 10 mcg/min (02/09/18 0500)  . valproate sodium       ROS:                                                                                                                                       Unable to obtain due to obtundation   Blood pressure (!) 90/58, pulse 67, temperature 98.4 F (36.9 C), temperature source Rectal, resp. rate 14, height 5\' 9"  (1.753 m), weight 82.4 kg, SpO2 95 %.   General Examination:                                                                                                       Physical Exam  HEENT-  [place]/AT   Lungs-On BiPAP Extremities- Pressure ulcers to heels bilaterally.   Neurological Examination Mental Status: Obtunded. Eyes closed. Will grimace and murmur to sustained sternal rub. Does not localize to noxious. Does not follow commands. No attempts to communicate.  Cranial Nerves: II: PERRL. Clamps eyes shut when attempting to assess for blink to threat.   III,IV, VI: Eyes close tightly; unable to assess for  EOM except for note made of conjugate slightly roving EOM with tendency towards upgaze.  V,VII: Face grossly symmetric in context of BiPAP. Unable to formally test facial sensation.  VIII: No response to voice IX,X: Unable to assess XI: Head at midline XII: Unable to assess Motor/Sensory: Moves upper and lower extremities  weakly to noxious stimuli after a significant delay, without asymmetry. Does not elevate any extremity antigravity. No posturing noted.  Deep Tendon Reflexes: 1+ bilateral brachioradialis, hypoactive patellae Plantars: Equivocal bilaterally Cerebellar/Gait: Unable to assess   Lab Results: Basic Metabolic Panel: Recent Labs  Lab 02/11/2018 2330  NA 144  K 3.9  CL 111  CO2 24  GLUCOSE 111*  BUN 28*  CREATININE 1.69*  CALCIUM 8.6*    CBC: Recent Labs  Lab 01/27/2018 2330  WBC 8.1  NEUTROABS 6.3  HGB 14.1  HCT 44.6  MCV 96.3  PLT 187    Cardiac Enzymes: No results for input(s): CKTOTAL, CKMB, CKMBINDEX, TROPONINI in the last 168 hours.  Lipid Panel: No results for input(s): CHOL, TRIG, HDL, CHOLHDL, VLDL, LDLCALC in the last 168 hours.  Imaging: Dg Chest Port 1 View  Result Date: 01/26/2018 CLINICAL DATA:  Sepsis and dyspnea EXAM: PORTABLE CHEST 1 VIEW COMPARISON:  11/10/2017 FINDINGS: Stable cardiomegaly with mild aortic atherosclerosis. Central vascular congestion is redemonstrated. Slight improvement in aeration at the lung bases with probable decrease in presumed small effusions. No acute pulmonary consolidation. Acute nor suspicious osseous abnormality. IMPRESSION: Stable cardiomegaly with mild central vascular congestion. Slight improvement in aeration at the lung bases with probable decrease in presumed small effusions. Electronically Signed   By: [name] Royalty M.D.   On: 02/10/2018 23:46    Assessment: 67 year old male with a history of seizures presenting from his SNF with hypotension, hypoxia, fever and AMS. Focal seizure activity was noted by ICU  staff.  1. His seizures are controlled at the SNF with valproic acid and Keppra. His valproic acid level during initial ED assessment was subtherapeutic at 20.  2. He does not appear to be actively seizing based on clinical exam. His obtundation is most likely a combination of postictal state and metabolic encephalopathy. Infection also likely a contributing factor regarding decreased seizure threshold and AMS.   Recommendations: 1. EEG.  2. Supplemental load of valproic acid at 15 mg/kg was administered in the ICU. Continuing with scheduled dosing at 500 mg IV TID.  3. Continue Keppra at 500 mg IV BID.   40 minutes spent in the evaluation and management of this critically ill patient  Electronically signed: Dr. Kerney Elbe 02/09/2018, 5:21 AM

## 2018-02-09 NOTE — ED Notes (Signed)
Admitting Provider at bedside. 

## 2018-02-09 NOTE — ED Notes (Signed)
Unchewed food found in pt's mouth. Oral airway suctioned of thick secretions.

## 2018-02-09 NOTE — Progress Notes (Signed)
RT placed patient on BIPAP. Patient tolerating well at this time. RT will monitor as needed. 

## 2018-02-09 NOTE — Progress Notes (Signed)
Pharmacy Antibiotic Note  Patrick Holt is a 67 y.o. male admitted on 02/07/2018 with sepsis.    Urinary strep came back positive today. D/w Dr. Vaughan Browner, we will narrow to ceftriaxone.   Plan: Dc vanc/zosyn Ceftriaxone 2g IV q24  Height: 5\' 9"  (175.3 cm) Weight: 181 lb 10.5 oz (82.4 kg) IBW/kg (Calculated) : 70.7  Temp (24hrs), Avg:98.4 F (36.9 C), Min:97.7 F (36.5 C), Max:99.2 F (37.3 C)  Recent Labs  Lab 01/28/2018 2330 01/16/2018 2342 02/09/18 0503 02/09/18 0510  WBC 8.1  --   --  10.6*  CREATININE 1.69*  --  1.78*  --   LATICACIDVEN  --  1.64  --   --     Estimated Creatinine Clearance: 39.7 mL/min (A) (by C-G formula based on SCr of 1.78 mg/dL (H)).    Allergies  Allergen Reactions  . Immune Globulins Other (See Comments)    Acute renal failure  . Rho (D) Immune Globulin     Acute renal failure Acute renal failure  . Ciprofloxacin Swelling  . Flagyl [Metronidazole] Swelling  . Lisinopril Diarrhea  . Sulfa Antibiotics Other (See Comments)    blisters  . Sulfasalazine     blisters blisters  . Cefepime Other (See Comments)    Diffuse maculopapular rash when receiving both vanc and cefepime, timing unclear.  . Morphine And Related Other (See Comments)    Ineffective for pain management  . Requip [Ropinirole Hcl] Other (See Comments)    constipation  . Vancomycin     Diffuse maculopapular rash when receiving both vanc/cefepime.  Timing unclear.    . Betamethasone Dipropionate Other (See Comments)    Unknown  . Betamethasone Dipropionate     Unknown  . Bupropion     Unknown  . Bupropion Hcl Other (See Comments)    Unknown  . Clobetasol Other (See Comments)    Unknown Unknown  . Codeine Other (See Comments)    Does not help with pain  . Escitalopram Oxalate Other (See Comments)    Unknown  . Escitalopram Oxalate     Unknown  . Fluoxetine Hcl Other (See Comments)    Unknown  . Fluoxetine Hcl     Unknown  . Furosemide Other (See Comments)   Unknown Unknown  . Meclizine Rash  . Paroxetine Other (See Comments)    Unknown  . Penicillins Other (See Comments)    Unknown reaction Has patient had a PCN reaction causing immediate rash, facial/tongue/throat swelling, SOB or lightheadedness with hypotension: unknown Has patient had a PCN reaction causing severe rash involving mucus membranes or skin necrosis: unknown Has patient had a PCN reaction that required hospitalization unknown Has patient had a PCN reaction occurring within the last 10 years: unknown If all of the above answers are "NO", then may proceed with Cephalosporin use. Unknown Unknown reactio  . Tacrolimus Other (See Comments)    Unknown  . Tetanus Toxoid Other (See Comments)    Unknown  . Tuberculin Purified Protein Derivative Other (See Comments)    Unknown     Onnie Boer, PharmD, BCIDP, AAHIVP, CPP Infectious Disease Pharmacist 02/09/2018 2:08 PM

## 2018-02-09 NOTE — Progress Notes (Signed)
Pt. Was NT sx. Per MD.

## 2018-02-09 NOTE — ED Notes (Signed)
ED Provider at bedside. 

## 2018-02-09 NOTE — Progress Notes (Signed)
EEG complete - results pending 

## 2018-02-09 NOTE — Progress Notes (Signed)
Pharmacy Antibiotic Note  Patrick Holt is a 67 y.o. male admitted on 01/30/2018 with sepsis.  Pharmacy has been consulted for vancomycin and zosyn dosing.  Plan: Vancomycin 1500 mg IV x 1 then 1250 mg IV q24 hours Zosyn 3.375 gm IV q8 hours F/u renal function, cultures and clinical course   Height: 5\' 9"  (175.3 cm) Weight: 181 lb 10.5 oz (82.4 kg) IBW/kg (Calculated) : 70.7  Temp (24hrs), Avg:98.8 F (37.1 C), Min:98.4 F (36.9 C), Max:99.2 F (37.3 C)  Recent Labs  Lab 01/30/2018 2330 02/11/2018 2342  WBC 8.1  --   CREATININE 1.69*  --   LATICACIDVEN  --  1.64    Estimated Creatinine Clearance: 41.8 mL/min (A) (by C-G formula based on SCr of 1.69 mg/dL (H)).    Allergies  Allergen Reactions  . Immune Globulins Other (See Comments)    Acute renal failure  . Rho (D) Immune Globulin     Acute renal failure Acute renal failure  . Ciprofloxacin Swelling  . Flagyl [Metronidazole] Swelling  . Lisinopril Diarrhea  . Sulfa Antibiotics Other (See Comments)    blisters  . Sulfasalazine     blisters blisters  . Cefepime Other (See Comments)    Diffuse maculopapular rash when receiving both vanc and cefepime, timing unclear.  . Morphine And Related Other (See Comments)    Ineffective for pain management  . Requip [Ropinirole Hcl] Other (See Comments)    constipation  . Vancomycin     Diffuse maculopapular rash when receiving both vanc/cefepime.  Timing unclear.    . Betamethasone Dipropionate Other (See Comments)    Unknown  . Betamethasone Dipropionate     Unknown  . Bupropion     Unknown  . Bupropion Hcl Other (See Comments)    Unknown  . Clobetasol Other (See Comments)    Unknown Unknown  . Codeine Other (See Comments)    Does not help with pain  . Escitalopram Oxalate Other (See Comments)    Unknown  . Escitalopram Oxalate     Unknown  . Fluoxetine Hcl Other (See Comments)    Unknown  . Fluoxetine Hcl     Unknown  . Furosemide Other (See Comments)   Unknown Unknown  . Meclizine Rash  . Paroxetine Other (See Comments)    Unknown  . Penicillins Other (See Comments)    Unknown reaction Has patient had a PCN reaction causing immediate rash, facial/tongue/throat swelling, SOB or lightheadedness with hypotension: unknown Has patient had a PCN reaction causing severe rash involving mucus membranes or skin necrosis: unknown Has patient had a PCN reaction that required hospitalization unknown Has patient had a PCN reaction occurring within the last 10 years: unknown If all of the above answers are "NO", then may proceed with Cephalosporin use. Unknown Unknown reactio  . Tacrolimus Other (See Comments)    Unknown  . Tetanus Toxoid Other (See Comments)    Unknown  . Tuberculin Purified Protein Derivative Other (See Comments)    Unknown     Thank you for allowing pharmacy to be a part of this patient's care.  Excell Seltzer [place] 02/09/2018 4:56 AM

## 2018-02-10 ENCOUNTER — Inpatient Hospital Stay (HOSPITAL_COMMUNITY): Payer: Medicare Other

## 2018-02-10 DIAGNOSIS — L8990 Pressure ulcer of unspecified site, unspecified stage: Secondary | ICD-10-CM

## 2018-02-10 LAB — URINE CULTURE: CULTURE: NO GROWTH

## 2018-02-10 LAB — GLUCOSE, CAPILLARY
GLUCOSE-CAPILLARY: 91 mg/dL (ref 70–99)
Glucose-Capillary: 95 mg/dL (ref 70–99)
Glucose-Capillary: 87 mg/dL (ref 70–99)
Glucose-Capillary: 122 mg/dL — ABNORMAL HIGH (ref 70–99)
Glucose-Capillary: 107 mg/dL — ABNORMAL HIGH (ref 70–99)
Glucose-Capillary: 95 mg/dL (ref 70–99)

## 2018-02-10 MED ORDER — METOPROLOL TARTRATE 5 MG/5ML IV SOLN
5.0000 mg | INTRAVENOUS | Status: DC | PRN
  Administered 2018-02-14 (×2): 5 mg via INTRAVENOUS
  Filled 2018-02-10 (×3): qty 5

## 2018-02-10 MED ORDER — BISACODYL 5 MG PO TBEC
5.0000 mg | DELAYED_RELEASE_TABLET | Freq: Every day | ORAL | Status: DC | PRN
  Administered 2018-02-12 – 2018-02-14 (×2): 5 mg via ORAL
  Filled 2018-02-10 (×2): qty 1

## 2018-02-10 MED ORDER — APIXABAN 5 MG PO TABS
5.0000 mg | ORAL_TABLET | Freq: Two times a day (BID) | ORAL | Status: DC
  Administered 2018-02-10 (×2): 5 mg via ORAL
  Filled 2018-02-10 (×4): qty 1

## 2018-02-10 MED ORDER — DOCUSATE SODIUM 100 MG PO CAPS
100.0000 mg | ORAL_CAPSULE | Freq: Two times a day (BID) | ORAL | Status: DC
  Administered 2018-02-10 (×2): 100 mg via ORAL
  Filled 2018-02-10 (×3): qty 1

## 2018-02-10 MED ORDER — PREDNISONE 2.5 MG PO TABS
2.5000 mg | ORAL_TABLET | Freq: Every day | ORAL | Status: DC
  Filled 2018-02-10: qty 1

## 2018-02-10 MED ORDER — AMIODARONE HCL 200 MG PO TABS
400.0000 mg | ORAL_TABLET | Freq: Every day | ORAL | Status: DC
  Administered 2018-02-10: 400 mg via ORAL
  Filled 2018-02-10 (×4): qty 2

## 2018-02-10 NOTE — Evaluation (Signed)
Clinical/Bedside Swallow Evaluation Patient Details  Name: Patrick Holt MRN: [id number] Date of Birth: May 19, 1950  Today's Date: 02/10/2018 Time: SLP Start Time (ACUTE ONLY): 0947 SLP Stop Time (ACUTE ONLY): 1001 SLP Time Calculation (min) (ACUTE ONLY): 14 min  Past Medical History:  Past Medical History:  Diagnosis Date  . Anemia   . Anxiety   . Asthma   . Atrial fibrillation ([place]) 01/2012   with RVR  . Bipolar 1 disorder ([place])   . Cancer ([place])   . Collagen vascular disease ([place])   . Conversion disorder 06/2010  . COPD (chronic obstructive pulmonary disease) ([place])   . CVA (cerebral vascular accident) ([place])   . Dermatomyositis ([place])   . Dermatomyositis ([place])   . Diverticulitis   . Diverticulosis of colon   . Dysrhythmia    "irregular" (11/15/2012)  . Esophageal dysmotility   . Esophageal stricture   . Fibromyalgia   . Gastritis   . GERD (gastroesophageal reflux disease)   . Hand fracture, right    sept 13, 2017, still wearing splint as og 01-10-2017  . Headache(784.0)    "severe; get them often" (11/15/2012)  . Hiatal hernia   . History of narcotic addiction ([place])   . Hx of adenomatous colonic polyps   . Hyperlipidemia   . Hypertension   . Internal hemorrhoids   . Ischemic heart disease   . Major depression    with acute psychotic break in 06/2010  . Myocardial infarct ([place])    mulitple (1999, 2000, 2004)  . Narcotic dependence ([place])   . Nephrolithiasis   . Obesity   . OCD (obsessive compulsive disorder)   . Otosclerosis   . Paroxysmal A-fib ([place])   . Peripheral neuropathy   . Raynaud's disease   . Renal insufficiency   . Rheumatoid arthritis(714.0)   . Sarcoidosis   . Seizures ([place])   . Subarachnoid hemorrhage ([place]) 01/2012   with subdural  hematoma.   . Tremor   . Type II diabetes mellitus ([place])   . Urge incontinence   . Vertigo    Past Surgical History:  Past Surgical History:  Procedure Laterality Date  . BACK SURGERY    . CARDIAC  CATHETERIZATION    . CARPAL TUNNEL RELEASE Bilateral   . CATARACT EXTRACTION W/ INTRAOCULAR LENS IMPLANT Left   . COLONOSCOPY N/A 09/27/2012   Procedure: COLONOSCOPY;  Surgeon: Lafayette Dragon, MD;  Location: [facility] ENDOSCOPY;  Service: Endoscopy;  Laterality: N/A;  . ESOPHAGOGASTRODUODENOSCOPY N/A 09/27/2012   Procedure: ESOPHAGOGASTRODUODENOSCOPY (EGD);  Surgeon: Lafayette Dragon, MD;  Location: [facility];  Service: Endoscopy;  Laterality: N/A;  . KNEE ARTHROSCOPY W/ MENISCAL REPAIR Left 2009  . LUMBAR DISC SURGERY    . LYMPH NODE DISSECTION Right 1970's   "neck; dr thought I had Hodgkins; turned out to be sarcoidosis" (11/15/2012)  . squamous papilloma   2010   removed by Dr. Constance Holster ENT, noted on tongue  . TONSILLECTOMY     HPI:  Pt is a [name] presenting from SNF with AMS, septic shock felt to be secondary to PNA. Upon arrival he had thick secretions and unchewed food in his oral cavity. Seizure-like activity was noted in the ICU. Pt has been intermittently requiring BiPAP. Pt has had BSEs before (01/2017, 02/2017) recommending Dys 3 diet, thin liquids as swallow appeared functional but pt preferred softer foods given lack of dentition. In September 2019 pt was evaluated with MBS after CVA with deep penetration of thin liquids that  couldn't be cleared. Dys 2 diet and nectar thick liquids recommended. Complex PMHx includes: atrial fibrillation on Eliquis, CHF, COPD, sarcoidosis, bipolar disorder, conversion disorder, DM, SAH, seizures, MI, HTN, HLD, HH, GERD, esophageal stricture, esophageal dysmotility, CVA   Assessment / Plan / Recommendation Clinical Impression  Pt does not show overt s/s of aspiration, but he is lethargic, confabulating, and has a h/o dysphagia, all of which puts him at higher risk. His mouth had a coating of dried secretions, having come off BiPAP earlier this morning. Recommend focusing on oral care, using a few ice chips after to facilitate moistening of the oral mucosa. Pt  could also have meds crushed in puree. Will f/u for readiness to restart diet as his mentation continues to clear. SLP Visit Diagnosis: Dysphagia, unspecified (R13.10)    Aspiration Risk  Moderate aspiration risk    Diet Recommendation NPO except meds;Ice chips PRN after oral care   Liquid Administration via: Cup;Straw Medication Administration: Crushed with puree    Other  Recommendations Oral Care Recommendations: Oral care QID   Follow up Recommendations Skilled Nursing facility      Frequency and Duration min 2x/week  2 weeks       Prognosis Prognosis for Safe Diet Advancement: Good Barriers to Reach Goals: Cognitive deficits      Swallow Study   General HPI: Pt is a [name] presenting from SNF with AMS, septic shock felt to be secondary to PNA. Upon arrival he had thick secretions and unchewed food in his oral cavity. Seizure-like activity was noted in the ICU. Pt has been intermittently requiring BiPAP. Pt has had BSEs before (01/2017, 02/2017) recommending Dys 3 diet, thin liquids as swallow appeared functional but pt preferred softer foods given lack of dentition. In September 2019 pt was evaluated with MBS after CVA with deep penetration of thin liquids that couldn't be cleared. Dys 2 diet and nectar thick liquids recommended. Complex PMHx includes: atrial fibrillation on Eliquis, CHF, COPD, sarcoidosis, bipolar disorder, conversion disorder, DM, SAH, seizures, MI, HTN, HLD, HH, GERD, esophageal stricture, esophageal dysmotility, CVA Type of Study: Bedside Swallow Evaluation Previous Swallow Assessment: see HPI Diet Prior to this Study: NPO Temperature Spikes Noted: No Respiratory Status: Nasal cannula History of Recent Intubation: No Behavior/Cognition: Lethargic/Drowsy;Requires cueing;Confused Oral Cavity Assessment: Dried secretions;Dry Oral Care Completed by SLP: Yes Oral Cavity - Dentition: Edentulous Self-Feeding Abilities: Total assist Patient Positioning:  Upright in bed Baseline Vocal Quality: Normal Volitional Cough: Weak Volitional Swallow: Able to elicit    Oral/Motor/Sensory Function Overall Oral Motor/Sensory Function: Generalized oral weakness(not following commands well)   Ice Chips Ice chips: Within functional limits Presentation: Spoon   Thin Liquid Thin Liquid: Within functional limits Presentation: Straw;Spoon    Nectar Thick Nectar Thick Liquid: Not tested   Honey Thick Honey Thick Liquid: Not tested   Puree Puree: Within functional limits Presentation: Spoon   Solid     Solid: Not tested      Germain Osgood 02/10/2018,10:49 AM  Germain Osgood, M.A. [place] Acute Environmental education officer [phone] Office [phone]

## 2018-02-10 NOTE — Progress Notes (Signed)
Subjective: No further seizures, he states that he feels that he likely had a stroke because he is unable to move his left arm as he typically does.  Exam: Vitals:   02/10/18 1000 02/10/18 1100  BP: 121/87 108/87  Pulse: 93 94  Resp: 13 14  Temp:    SpO2: 92% 91%   Gen: In bed, NAD Resp: non-labored breathing, no acute distress Abd: soft, nt  Neuro: MS: Awake, alert, interactive and appropriate CN: Visual fields full, face is symmetric Motor: He limits movement of his left arm, but there is some pain involved as well, seems to be effort related.  He moves bilateral legs with good strength Sensory: Intact to light touch  Impression: 67 year old male with breakthrough seizures in the setting of sepsis.  He is on a very low-dose of Depakote and his level was low so I agree with increasing it from his home 250 every 8 hours to his current 500 every 8 hours.  He does endorse some left-sided symptoms, I think it would be reasonable to do an MRI, but I suspect that this is more due to pain than true weakness.  Recommendations: 1) continue Depacon 500 3 times daily, once reliably taking p.o. could change to Depakote 750 twice daily 2) continue Keppra 500 twice daily 3) MRI brain  Roland Rack, MD [facility] [phone]  If 7pm- 7am, please page neurology on call as listed in [place].

## 2018-02-10 NOTE — Progress Notes (Addendum)
NAME:  Patrick Holt, MRN:  [id number], DOB:  October 16, 1950, LOS: 1 ADMISSION DATE:  01/16/2018, CONSULTATION DATE:  02/09/2018 REFERRING MD:  [name] Carnes, PA, CHIEF COMPLAINT:  Altered mental status  Brief History   75 yoM w/extensive hx presenting from SNF with AMS, septic shock secondary to pneumococcus pneumonia.  Placed on BiPAP and Levophed and admitted to ICU.  Past Medical History  Anemia, Anxiety, Asthma, Atrial fibrillation on Eliquis [facility]) (01/2012), Bipolar 1 disorder ([place]), Cancer ([place]), Collagen vascular disease ([place]), Conversion disorder (06/2010), COPD (chronic obstructive pulmonary disease) ([place]), CVA (cerebral vascular accident) ([place]), Dermatomyositis ([place]), Dermatomyositis ([place]), Diverticulitis, Diverticulosis of colon, Dysrhythmia, Esophageal dysmotility, Esophageal stricture, Fibromyalgia, Gastritis, GERD (gastroesophageal reflux disease), Hand fracture, right, Headache(784.0), Hiatal hernia, History of narcotic addiction ([place]), adenomatous colonic polyps, Hyperlipidemia, Hypertension, Internal hemorrhoids, Ischemic heart disease, Major depression, Myocardial infarct ([place]), Narcotic dependence ([place]), Nephrolithiasis, Obesity, OCD (obsessive compulsive disorder), Otosclerosis, Paroxysmal A-fib ([place]), Peripheral neuropathy, Raynaud's disease, Renal insufficiency, Rheumatoid arthritis(714.0), Sarcoidosis, Seizures ([place]), Subarachnoid hemorrhage ([place]) (01/2012), Tremor, Type II diabetes mellitus ([place]), Urge incontinence, and Vertigo.  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  Mental status has improved  Objective   Blood pressure 108/82, pulse 89, temperature (!) 96.2 F (35.7 C), temperature source Axillary, resp. rate 12, height 5\' 9"  (1.753 m), weight 86.6 kg, SpO2 97 %.    FiO2 (%):  [60 %] 60 %   Intake/Output Summary (Last 24 hours) at 02/10/2018 0810 Last data filed at 02/10/2018 0800 Gross per 24 hour  Intake 1385.1 ml  Output 775 ml  Net 610.1 ml   Filed Weights   02/11/2018 2319 02/09/18 0330 02/10/18 0500  Weight: 81.2 kg 82.4 kg 86.6 kg    Examination: Gen:      No acute distress HEENT:  EOMI, sclera anicteric Neck:     No masses; no thyromegaly Lungs:    Clear to auscultation bilaterally; normal respiratory effort CV:         Regular rate and rhythm; no murmurs Abd:      + bowel sounds; soft, non-tender; no palpable masses, no distension Ext:    No edema; adequate peripheral perfusion Skin:      Warm and dry; no rash Neuro: Awake, responsive  Resolved Hospital Problem list    Assessment & Plan:  Septic shock P:  Off pressors Follow cultures Antibiotics as above  Acute on chronic hypoxic respiratory failure Pneumococcus pneumonia. Hx COPD, tobacco abuse, sarcoid (not on home O2), also noted some component of probable pulmonary hypertension given PASP 51 on TTE 10/2017  P:  BiPAP as needed duonebs q 4 prn Brovanna/ pulmicort nebs BID  Acute encephalopathy - presumed due to sepsis vs postictal from seizure as acute infection could lower his threshold, vs psych component vs CNS process  Hx seizures, conversion disorder, bipolar  P:  Continue keppra, valproate Seizure precautions Frequent neuro checks Holding home xanax, sinemet, Risperdal   AKI P:  Follow urine output and creatinine.  Prolonged QTc  Afib on Eliquis - currently rate controlled P:  Tele monitor  Avoid QTc meds Holding home  amiodarone, inderal Checking mag Holding Eliquis for now, when more alert, consider restarting vs transitioning to heparin gtt  Hyperglycemia P:  CBG q 4  RA/ sarcoidosis P  Continue IV Solu-Medrol for now Convert to home dose of prednisone when able to take p.o.  HLD P:  Resume lipitor when mental status improves   Protein calorie malnutrition with previous failure to thrive P:  Keep n.p.o. Speech and swallow eval.  Best practice:  Diet: NPO Pain/Anxiety/Delirium protocol (if indicated): n/a VAP protocol (if indicated): n/a DVT prophylaxis: SCDs for now, will need to restart eliquis vs heparin GI prophylaxis: pepcid Glucose control: cbg q 4, add if needed Mobility: BR Code Status: Full, consider PMT consult given extensive underlying health conditions including failure to thrive Family Communication: no family w/patient Disposition: Transfer out of ICU  Marshell Garfinkel MD [facility] Pager [phone] If no answer call [phone] 02/10/2018, 8:11 AM

## 2018-02-10 NOTE — Progress Notes (Signed)
Pt. Is refusing to go on bipap at this time. RN is aware. RT to monitor.

## 2018-02-11 ENCOUNTER — Inpatient Hospital Stay (HOSPITAL_COMMUNITY): Payer: Medicare Other

## 2018-02-11 DIAGNOSIS — G9340 Encephalopathy, unspecified: Secondary | ICD-10-CM

## 2018-02-11 DIAGNOSIS — J9811 Atelectasis: Secondary | ICD-10-CM

## 2018-02-11 DIAGNOSIS — J9601 Acute respiratory failure with hypoxia: Secondary | ICD-10-CM

## 2018-02-11 LAB — BASIC METABOLIC PANEL
ANION GAP: 11 (ref 5–15)
BUN: 22 mg/dL (ref 8–23)
CALCIUM: 8.9 mg/dL (ref 8.9–10.3)
CO2: 27 mmol/L (ref 22–32)
Chloride: 109 mmol/L (ref 98–111)
Creatinine, Ser: 0.68 mg/dL (ref 0.61–1.24)
GFR calc Af Amer: >60 mL/min (ref >60)
Glucose, Bld: 106 mg/dL — ABNORMAL HIGH (ref 70–99)
Potassium: 3.7 mmol/L (ref 3.5–5.1)
Sodium: 147 mmol/L — ABNORMAL HIGH (ref 135–145)

## 2018-02-11 LAB — POCT I-STAT 3, ART BLOOD GAS (G3+)
ACID-BASE EXCESS: 2 mmol/L (ref 0.0–2.0)
Acid-Base Excess: 5 mmol/L — ABNORMAL HIGH (ref 0.0–2.0)
Bicarbonate: 26.7 mmol/L (ref 20.0–28.0)
Bicarbonate: 29.1 mmol/L — ABNORMAL HIGH (ref 20.0–28.0)
O2 SAT: 88 %
O2 Saturation: 99 %
PH ART: 7.489 — AB (ref 7.350–7.450)
Patient temperature: 97.7
Patient temperature: 96.9
TCO2: 28 mmol/L (ref 22–32)
TCO2: 30 mmol/L (ref 22–32)
pCO2 arterial: 41.1 mmHg (ref 32.0–48.0)
pCO2 arterial: 38.1 mmHg (ref 32.0–48.0)
pH, Arterial: 7.418 (ref 7.350–7.450)
pO2, Arterial: 52 mmHg — ABNORMAL LOW (ref 83.0–108.0)
pO2, Arterial: 139 mmHg — ABNORMAL HIGH (ref 83.0–108.0)

## 2018-02-11 LAB — GLUCOSE, CAPILLARY
GLUCOSE-CAPILLARY: 101 mg/dL — AB (ref 70–99)
Glucose-Capillary: 91 mg/dL (ref 70–99)
Glucose-Capillary: 100 mg/dL — ABNORMAL HIGH (ref 70–99)
Glucose-Capillary: 89 mg/dL (ref 70–99)
Glucose-Capillary: 79 mg/dL (ref 70–99)

## 2018-02-11 LAB — CBC
HCT: 40.9 % (ref 39.0–52.0)
Hemoglobin: 13 g/dL (ref 13.0–17.0)
MCH: 30.1 pg (ref 26.0–34.0)
MCHC: 31.8 g/dL (ref 30.0–36.0)
MCV: 94.7 fL (ref 80.0–100.0)
PLATELETS: 179 10*3/uL (ref 150–400)
RBC: 4.32 MIL/uL (ref 4.22–5.81)
RDW: 16.6 % — ABNORMAL HIGH (ref 11.5–15.5)
WBC: 8.2 10*3/uL (ref 4.0–10.5)
nRBC: 0 % (ref 0.0–0.2)

## 2018-02-11 LAB — MAGNESIUM: MAGNESIUM: 2 mg/dL (ref 1.7–2.4)

## 2018-02-11 LAB — PHOSPHORUS: Phosphorus: 1.6 mg/dL — ABNORMAL LOW (ref 2.5–4.6)

## 2018-02-11 MED ORDER — DOCUSATE SODIUM 50 MG/5ML PO LIQD
100.0000 mg | Freq: Two times a day (BID) | ORAL | Status: DC
  Administered 2018-02-11 – 2018-02-17 (×10): 100 mg
  Filled 2018-02-11 (×11): qty 10

## 2018-02-11 MED ORDER — FREE WATER
250.0000 mL | Freq: Four times a day (QID) | Status: DC
  Administered 2018-02-11 – 2018-02-17 (×25): 250 mL

## 2018-02-11 MED ORDER — FENTANYL BOLUS VIA INFUSION
25.0000 ug | INTRAVENOUS | Status: DC | PRN
  Administered 2018-02-11 – 2018-02-17 (×12): 25 ug via INTRAVENOUS
  Filled 2018-02-11: qty 25

## 2018-02-11 MED ORDER — FENTANYL CITRATE (PF) 100 MCG/2ML IJ SOLN
50.0000 ug | Freq: Once | INTRAMUSCULAR | Status: AC
  Administered 2018-02-11: 50 ug via INTRAVENOUS

## 2018-02-11 MED ORDER — PREDNISONE 2.5 MG PO TABS
2.5000 mg | ORAL_TABLET | Freq: Every day | ORAL | Status: DC
  Administered 2018-02-12 – 2018-02-14 (×3): 2.5 mg
  Filled 2018-02-11 (×3): qty 1

## 2018-02-11 MED ORDER — MIDAZOLAM HCL 2 MG/2ML IJ SOLN
2.0000 mg | Freq: Once | INTRAMUSCULAR | Status: AC
  Administered 2018-02-11: 2 mg via INTRAVENOUS

## 2018-02-11 MED ORDER — GUAIFENESIN 100 MG/5ML PO SOLN
5.0000 mL | Freq: Four times a day (QID) | ORAL | Status: DC
  Administered 2018-02-11 – 2018-02-17 (×24): 100 mg
  Filled 2018-02-11 (×25): qty 5

## 2018-02-11 MED ORDER — ETOMIDATE 2 MG/ML IV SOLN
0.3000 mg/kg | Freq: Once | INTRAVENOUS | Status: AC
  Administered 2018-02-11: 26.32 mg via INTRAVENOUS

## 2018-02-11 MED ORDER — SODIUM CHLORIDE 0.9 % IV SOLN: 250.0000 mL | INTRAVENOUS | Status: DC

## 2018-02-11 MED ORDER — FENTANYL CITRATE (PF) 100 MCG/2ML IJ SOLN: 50.0000 ug | Freq: Once | INTRAMUSCULAR | Status: AC

## 2018-02-11 MED ORDER — NOREPINEPHRINE BITARTRATE 1 MG/ML IV SOLN
2.0000 ug/min | INTRAVENOUS | Status: DC
  Administered 2018-02-11 – 2018-02-12 (×2): 2 ug/min via INTRAVENOUS
  Administered 2018-02-14: 4 ug/min via INTRAVENOUS
  Administered 2018-02-15: 2 ug/min via INTRAVENOUS
  Filled 2018-02-11 (×4): qty 4

## 2018-02-11 MED ORDER — MIDAZOLAM HCL 2 MG/2ML IJ SOLN
INTRAMUSCULAR | Status: AC
  Administered 2018-02-11: 2 mg via INTRAVENOUS
  Filled 2018-02-11: qty 2

## 2018-02-11 MED ORDER — NOREPINEPHRINE BITARTRATE 1 MG/ML IV SOLN: 0.0000 ug/min | INTRAVENOUS | Status: DC

## 2018-02-11 MED ORDER — FENTANYL CITRATE (PF) 100 MCG/2ML IJ SOLN
INTRAMUSCULAR | Status: AC
  Administered 2018-02-11: 100 ug via INTRAVENOUS
  Filled 2018-02-11: qty 2

## 2018-02-11 MED ORDER — FENTANYL 2500MCG IN NS 250ML (10MCG/ML) PREMIX INFUSION
25.0000 ug/h | INTRAVENOUS | Status: DC
  Administered 2018-02-13 (×2): 100 ug/h via INTRAVENOUS
  Administered 2018-02-14 – 2018-02-17 (×2): 75 ug/h via INTRAVENOUS
  Filled 2018-02-11 (×5): qty 250

## 2018-02-11 MED ORDER — FENTANYL BOLUS VIA INFUSION
25.0000 ug | INTRAVENOUS | Status: DC | PRN
  Filled 2018-02-11: qty 25

## 2018-02-11 MED ORDER — CHLORHEXIDINE GLUCONATE 0.12% ORAL RINSE (MEDLINE KIT)
15.0000 mL | Freq: Two times a day (BID) | OROMUCOSAL | Status: DC
  Administered 2018-02-11 – 2018-02-19 (×14): 15 mL via OROMUCOSAL

## 2018-02-11 MED ORDER — APIXABAN 5 MG PO TABS
5.0000 mg | ORAL_TABLET | Freq: Two times a day (BID) | ORAL | Status: DC
  Administered 2018-02-11: 5 mg
  Filled 2018-02-11 (×2): qty 1

## 2018-02-11 MED ORDER — AMIODARONE HCL 200 MG PO TABS
400.0000 mg | ORAL_TABLET | Freq: Every day | ORAL | Status: DC
  Administered 2018-02-12 – 2018-02-13 (×2): 400 mg
  Filled 2018-02-11 (×2): qty 2

## 2018-02-11 MED ORDER — GUAIFENESIN ER 600 MG PO TB12
600.0000 mg | ORAL_TABLET | Freq: Two times a day (BID) | ORAL | Status: DC
  Filled 2018-02-11: qty 1

## 2018-02-11 MED ORDER — FENTANYL 2500MCG IN NS 250ML (10MCG/ML) PREMIX INFUSION
25.0000 ug/h | INTRAVENOUS | Status: DC
  Administered 2018-02-11: 50 ug/h via INTRAVENOUS
  Filled 2018-02-11: qty 250

## 2018-02-11 MED ORDER — ETHACRYNATE SODIUM 50 MG IV SOLR
50.0000 mg | Freq: Once | INTRAVENOUS | Status: AC
  Administered 2018-02-11: 50 mg via INTRAVENOUS
  Filled 2018-02-11: qty 50

## 2018-02-11 MED ORDER — FENTANYL CITRATE (PF) 100 MCG/2ML IJ SOLN
100.0000 ug | Freq: Once | INTRAMUSCULAR | Status: AC
  Administered 2018-02-11: 100 ug via INTRAVENOUS

## 2018-02-11 MED ORDER — ROCURONIUM BROMIDE 50 MG/5ML IV SOLN
50.0000 mg | Freq: Once | INTRAVENOUS | Status: AC
  Administered 2018-02-11: 50 mg via INTRAVENOUS
  Filled 2018-02-11: qty 5

## 2018-02-11 MED ORDER — ORAL CARE MOUTH RINSE
15.0000 mL | OROMUCOSAL | Status: DC
  Administered 2018-02-11 – 2018-02-20 (×86): 15 mL via OROMUCOSAL

## 2018-02-11 NOTE — Progress Notes (Signed)
SLP Cancellation Note  Patient Details Name: [name] MRN: [id number] DOB: Dec 28, 1950   Cancelled treatment:       Reason Eval/Treat Not Completed: Medical issues which prohibited therapy. Pt on BiPAP and getting chest PT this am. Will f/u in the coming days for medical readiness for PO intake.    Gal Smolinski, Katherene Ponto 02/11/2018, 9:05 AM

## 2018-02-11 NOTE — Procedures (Signed)
OGT Placement  OGT placed under direct laryngoscopy and confirmed by auscultation  Rush Farmer, M.D. [facility] Pulmonary/Critical Care Medicine. Pager: [phone]. After hours pager: [phone].

## 2018-02-11 NOTE — Progress Notes (Signed)
Initial Nutrition Assessment  DOCUMENTATION CODES:  Non-severe (moderate) malnutrition in context of chronic illness  INTERVENTION:  Once Order placed, Initiate TF via OGT with Vital AF 1.2 at goal rate of 50 ml/h (1200 ml per day) and Prostat 30 ml TID to provide 1740 kcals, 135 gm protein, 973 ml free water daily.   NUTRITION DIAGNOSIS:  Moderate Malnutrition related to chronic illness, poor appetite as evidenced by mild fat wasting and an apparent loss of ~20% bw in <6 months  GOAL:  Patient will meet greater than or equal to 90% of their needs  MONITOR:  Diet advancement, Vent status, Labs, Skin, TF tolerance  REASON FOR ASSESSMENT:  Ventilator    ASSESSMENT:  67 y/o male PMHx CHF, COPD, CVA seizure disorder, RA, bipolar, conversion disorder. Long term SNF resident. Presented w/ AMS, hypotension, hypoxia and fever in setting of presumed sepsis. Started on BiPAP and vasopressors.   Pt intubated, unable to converse. RN reports pt has had no oral intake since admit.   Bed weight is 84.6 Kg (186.5 lbs). Was 181 lbs 11 oz on arrival. Pt appears to have lost a sizeable amount of weight over the past 6 months. Pt was 232.3 in July of this year. This equates to a loss of ~20% bw in 6 months.   Atleast meets criteria for moderate malnutrition with mild fat wasting and weight loss.   Patient is currently intubated on ventilator support MV: 10.7 L/min Temp (24hrs), Avg:97.1 F (36.2 C), Min:96.5 F (35.8 C), Max:97.8 F (36.6 C) Propofol: None  Labs: BGs 90-110, Phos: 4.4-> 1.6, Na: 147, BUN/Creat: 27/1.78 (28/1.69 on admit) Meds: Valproic acid Vasopressor Support: Norepinephrine   Recent Labs  Lab 02/03/2018 2330 02/09/18 0413 02/09/18 0503 02/11/18 0240  NA 144  --  145 147*  K 3.9  --  4.3 3.7  CL 111  --  111 109  CO2 24  --  23 27  BUN 28*  --  27* 22  CREATININE 1.69*  --  1.78* 0.68  CALCIUM 8.6*  --  8.1* 8.9  MG  --  1.7  --  2.0  PHOS  --   --  4.4 1.6*   GLUCOSE 111*  --  120* 106*    NUTRITION - FOCUSED PHYSICAL EXAM:   Most Recent Value  Orbital Region  Mild depletion  Upper Arm Region  No depletion  Thoracic and Lumbar Region  Mild depletion  Buccal Region  No depletion  Temple Region  Mild depletion  Clavicle Bone Region  No depletion  Clavicle and Acromion Bone Region  No depletion  Scapular Bone Region  No depletion  Dorsal Hand  No depletion  Patellar Region  No depletion  Anterior Thigh Region  No depletion  Posterior Calf Region  No depletion  Edema (RD Assessment)  None  Hair  Reviewed  Eyes  Reviewed  Mouth  Reviewed  Skin  Reviewed  Nails  Reviewed     Diet Order:   Diet Order            Diet NPO time specified  Diet effective now             EDUCATION NEEDS:  No education needs have been identified at this time  Skin:   DTI: Sacrum  Last BM:  12/28-per RN  Height:  Ht Readings from Last 1 Encounters:  02/04/2018 5\' 9"  (1.753 m)   Weight:  Wt Readings from Last 1 Encounters:  02/11/18 84.6 kg  Wt Readings from Last 10 Encounters:  02/11/18 84.6 kg  11/09/17 95.9 kg  10/11/17 97.2 kg  09/07/17 105.7 kg  06/16/17 86.2 kg  03/07/17 100 kg  02/20/17 107 kg  02/10/17 105.2 kg  01/14/17 111 kg  12/16/16 102.1 kg   Ideal Body Weight:  72.73 kg  BMI:  Body mass index is 27.54 kg/m.  Dosing wt: 181 lbs 11 oz (82.6 kg) Estimated Nutritional Needs:  Kcal:  1760 kcals (PSU 2003 b) Protein:  124-140 g Pro (1.5-1.7g/kg bw) Fluid:  Per MD goals  Burtis Junes RD, LDN, CNSC Clinical Nutrition Available Tues-Sat via Pager: [phone] 02/11/2018 3:14 PM

## 2018-02-11 NOTE — Progress Notes (Signed)
[place] Progress Note Patient Name: GLYNN YEPES DOB: [birth year] MRN: [id number]   Date of Service  02/11/2018  HPI/Events of Note  Hypoxia - ABG on HFNC = 7.41/41.1/52.0/26.7 and Sat = 88%, Sat now = 91% by oximetry. HR = 54.   eICU Interventions  Will continue to observe.      Intervention Category Major Interventions: Hypoxemia - evaluation and management  Sommer,Saketh Eugene 02/11/2018, 3:21 AM

## 2018-02-11 NOTE — Progress Notes (Signed)
eLink Physician-Brief Progress Note Patient Name: Patrick Holt DOB: 1950-12-20 MRN: [id number]   Date of Service  02/11/2018  HPI/Events of Note  Increased O2 requirement. CXR last evening looks like mild pulmonary edema. Allergy to Lasix.   eICU Interventions  Will orderL' 1. Ethacrynic Acid 50 mg IV X 1 now.      Intervention Category Major Interventions: Hypoxemia - evaluation and management  Deyon Chizek,Shrey Eugene 02/11/2018, 5:54 AM

## 2018-02-11 NOTE — Progress Notes (Signed)
eLink Physician-Brief Progress Note Patient Name: Patrick Holt DOB: [birth year] MRN: [id number]   Date of Service  02/11/2018  HPI/Events of Note  Called d/t HR = 30's. Now HR = 57. Patient moved from HFNC to BiPAP. Patient on Amiodarone and Metoprolol PRN HR > 140.  eICU Interventions  Will order: 1. ABG now.      Intervention Category Major Interventions: Arrhythmia - evaluation and management  Karion Cudd,Veto Eugene 02/11/2018, 2:35 AM

## 2018-02-11 NOTE — Progress Notes (Signed)
NAME:  Patrick Holt, MRN:  [id number], DOB:  30-Apr-1950, LOS: 2 ADMISSION DATE:  01/22/2018, CONSULTATION DATE:  02/09/2018 REFERRING MD:  [name] Carnes, PA, CHIEF COMPLAINT:  Altered mental status  Brief History   18 yoM w/extensive hx presenting from SNF with AMS, septic shock secondary to pneumococcus pneumonia.  Placed on BiPAP and Levophed and admitted to ICU.  Past Medical History  Anemia, Anxiety, Asthma, Atrial fibrillation on Eliquis [facility]) (01/2012), Bipolar 1 disorder ([place]), Cancer ([place]), Collagen vascular disease ([place]), Conversion disorder (06/2010), COPD (chronic obstructive pulmonary disease) ([place]), CVA (cerebral vascular accident) ([place]), Dermatomyositis ([place]), Dermatomyositis ([place]), Diverticulitis, Diverticulosis of colon, Dysrhythmia, Esophageal dysmotility, Esophageal stricture, Fibromyalgia, Gastritis, GERD (gastroesophageal reflux disease), Hand fracture, right, Headache(784.0), Hiatal hernia, History of narcotic addiction ([place]), adenomatous colonic polyps, Hyperlipidemia, Hypertension, Internal hemorrhoids, Ischemic heart disease, Major depression, Myocardial infarct ([place]), Narcotic dependence ([place]), Nephrolithiasis, Obesity, OCD (obsessive compulsive disorder), Otosclerosis, Paroxysmal A-fib ([place]), Peripheral neuropathy, Raynaud's disease, Renal insufficiency, Rheumatoid arthritis(714.0), Sarcoidosis, Seizures ([place]), Subarachnoid hemorrhage ([place]) (01/2012), Tremor, Type II diabetes mellitus ([place]), Urge incontinence, and Vertigo.  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  Mental status has improved  Objective   Blood pressure (!) 116/99, pulse 93, temperature (!) 96.6 F (35.9 C), temperature source Axillary, resp. rate 20, height _0  (1.753 m), weight 87.7 kg, SpO2 97 %.    FiO2 (%):  [100 %] 100 %   Intake/Output Summary (Last 24 hours) at 02/11/2018 1011 Last data filed at 02/11/2018 0800 Gross per 24 hour  Intake 1471.01 ml  Output 820 ml  Net 651.01 ml   Filed Weights   02/09/18 0330 02/10/18 0500 02/11/18 0500  Weight: 82.4 kg 86.6 kg 87.7 kg    Examination: Gen:      Acutely ill appearing male, moderate respiratory distress HEENT:  [place]/AT, PERRL, EOM-I and MMM Neck:      No JVD, thyromegaly or LAN Lungs:    Decreased BS on the left CV:         RRR, Nl S1/S2 and -M/R/G Abd:      Soft, NT, ND and +BS Ext:    -edema and tenderness Skin:       Intact Neuro:    Awake and interactive, moving all ext to command  Resolved Hospital Problem list    Assessment & Plan:  Septic shock P:  Restart pressors for MAP of 65% F/U on cultures Abx as above  Acute on chronic hypoxic respiratory failure Pneumococcus pneumonia. Hx COPD, tobacco abuse, sarcoid (not on home O2), also noted some component of probable pulmonary hypertension given PASP 51 on TTE 10/2017  Left sided mucous plug P:  Intubate Full vent support Bronch for BAL and  duonebs q 4 prn Brovanna/ pulmicort nebs BID CXR and ABG Adjust vent for ABG  Acute encephalopathy - presumed due to sepsis vs postictal from seizure as acute infection could lower his threshold, vs psych component vs CNS process  Hx seizures, conversion disorder, bipolar  P:  Continue keppra, valproate Seizure precautions Frequent neuro checks Holding home xanax, sinemet, Risperdal  Fentanyl drip No benzos  AKI P:  Restart TF Free water 250 ml q6 via NGT  Prolonged QTc Afib  on Eliquis - currently rate controlled P:  Tele monitor  Avoid QTc meds Holding home amiodarone, inderal Checking mag Holding Eliquis for now, when more alert, consider restarting vs transitioning to heparin gtt  Hyperglycemia P:  CBG q 4  RA/ sarcoidosis P  Continue IV Solu-Medrol for now Convert to home dose of prednisone when able to take p.o.  HLD P:  Resume lipitor when mental status improves and able to swallow   Protein calorie malnutrition with previous failure to thrive P:  Keep n.p.o. Speech and swallow eval.  The patient is critically ill with multiple organ systems failure and requires high complexity decision making for assessment and support, frequent evaluation and titration of therapies, application of advanced monitoring technologies and extensive interpretation of multiple databases.   Critical Care Time devoted to patient care services described in this note is  36  Minutes. This time reflects time of care of this signee Dr [name]. This critical care time does not reflect procedure time, or teaching time or supervisory time of PA/NP/Med student/Med Resident etc but could involve care discussion time.  Rush Farmer, M.D. [facility] Pulmonary/Critical Care Medicine. Pager: [phone]. After hours pager: [phone].

## 2018-02-11 NOTE — Progress Notes (Signed)
Noted normal MRI. I suspect the left arm limitation was due to pain, not true weakness.   Patient intubated just prior to me seeing him for respiratory reasons. Pupils are reactive, but otherwise still paralyzed.   Continue depacon 500mg  TID, once taking PO, change to depakote DR 750mg  BID  Continue keppra 500mg  BID.   Please call with further questions or concerns.   Roland Rack, MD [facility] [phone]  If 7pm- 7am, please page neurology on call as listed in [place].

## 2018-02-11 NOTE — Procedures (Signed)
Bronchoscopy Procedure Note MOREY ANDONIAN [id number] [birth year]  Procedure: Bronchoscopy Indications: Obtain specimens for culture and/or other diagnostic studies and Remove secretions  Procedure Details Consent: Unable to obtain consent because of emergent medical necessity. Time Out: Verified patient identification, verified procedure, site/side was marked, verified correct patient position, special equipment/implants available, medications/allergies/relevent history reviewed, required imaging and test results available.  Performed  In preparation for procedure, patient was given 100% FiO2 and bronchoscope lubricated. Sedation: Benzodiazepines, Muscle relaxants, Etomidate and Fentanyl  Airway entered and the following bronchi were examined: RUL, RML, RLL, LUL, LLL and Bronchi.   Left main completely blocked off by think mucous, BAL from the LLL Bronchoscope removed.  , Patient placed back on 100% FiO2 at conclusion of procedure.    Evaluation Hemodynamic Status: BP stable throughout; O2 sats: stable throughout Patient's Current Condition: stable Specimens:  Sent purulent fluid Complications: No apparent complications Patient did tolerate procedure well.   [name] 02/11/2018

## 2018-02-11 NOTE — Progress Notes (Signed)
Notified Dr. Nelda Marseille about pt consistent bradycardia, off sedation. BP stable, MAP >80. No new orders given.

## 2018-02-11 NOTE — Progress Notes (Signed)
Pt HR brady'd down into low 40's, all sedation turned off. When stimulated, PT HR 120's. MD aware, no new orders. Other VSS. Will continue to monitor.

## 2018-02-11 NOTE — Progress Notes (Signed)
   Initially PCCM intended for patient to be transferred to [facility] on 02/11/2018  However upon review of chart and brief evaluation/exam of patient patient remains very hypoxic with O2 sats in the low 80s despite being on 100% FiO2 on BiPAP...  Patient has very diminished air movement in the left lung, reviewed chest x-ray from 02/11/2018 suggest left lung lobar collapse.....???? mucus plug with pleural effusion  Face-to-face discussion with Dr. Nelda Marseille PCCM attending ED----- whol graciously agreed to keep patient on PCCM service.......... patient bronched and intubated by Dr. Nelda Marseille  PCCM with contact hospitalist service when patient is medically ready for transfer  Roxan Hockey, MD

## 2018-02-11 NOTE — Procedures (Signed)
Intubation Procedure Note Patrick Holt [id number] 1950-04-05  Procedure: Intubation Indications: Respiratory insufficiency  Procedure Details Consent: Unable to obtain consent because of emergent medical necessity. Time Out: Verified patient identification, verified procedure, site/side was marked, verified correct patient position, special equipment/implants available, medications/allergies/relevent history reviewed, required imaging and test results available.  Performed  Maximum sterile technique was used including gloves, hand hygiene and mask.  MAC    Evaluation Hemodynamic Status: BP stable throughout; O2 sats: stable throughout Patient's Current Condition: stable Complications: No apparent complications Patient did tolerate procedure well. Chest X-ray ordered to verify placement.  CXR: pending.   [name] 02/11/2018

## 2018-02-11 NOTE — Progress Notes (Signed)
Physical Therapy Evaluation Patient Details Name: Patrick Holt MRN: [id number] DOB: 16-Aug-1950 Today's Date: 02/11/2018   History of Present Illness  39 yoM w/extensive hx presenting from SNF with AMS, septic shock secondary to pneumococcus pneumonia. PMH includes but not limited to:  CVA, COPD, CA, A fib, MI, HLD, HTN, Conversion disorder, Asthma, Anemia, R hand fracture, major depression, lumbar disk surgery.     Clinical Impression  Pt admitted with above diagnosis. Pt currently with functional limitations due to the deficits listed below (see PT Problem List). RN permitted bed level eval on BiPAP, pt reporting unreliable history stating he lives at home alone and walks despite chart indicating he is coming from SNF. Patient extremely debilitated  with L sided weakness greater than right. Max A of 2 people to sit from elevated bed, desats quickly to 85%. Limited session to bed level and PROM.  Pt will benefit from skilled PT to increase their independence and safety with mobility to allow discharge to the venue listed below.    BP 116/99 HR 75      Follow Up Recommendations SNF    Equipment Recommendations  (TBD)    Recommendations for Other Services       Precautions / Restrictions Precautions Precaution Comments: Watch O2 Restrictions Weight Bearing Restrictions: No      Mobility  Bed Mobility Overal bed mobility: Needs Assistance Bed Mobility: Supine to Sit;Sit to Supine     Supine to sit: Max assist;+2 for physical assistance Sit to supine: Max assist;+2 for physical assistance      Transfers                 General transfer comment: Unable  Ambulation/Gait             General Gait Details: Unable  Stairs            Wheelchair Mobility    Modified Rankin (Stroke Patients Only)       Balance Overall balance assessment: (total assist for sitting balance today)                                            Pertinent Vitals/Pain Pain Assessment: Faces Faces Pain Scale: Hurts a little bit Pain Location: generalized discomfort Pain Descriptors / Indicators: Discomfort;Grimacing Pain Intervention(s): Limited activity within patient's tolerance    Home Living Family/patient expects to be discharged to:: Skilled nursing facility                 Additional Comments: Per RN patient living at [facility]. pt states he lives at home    Prior Function Level of Independence: Needs assistance   Gait / Transfers Assistance Needed: pt reports able to ambulate short distances with RW  ADL's / Homemaking Assistance Needed: aide assists with bathing/dressing; sponge bathes        Hand Dominance        Extremity/Trunk Assessment   Upper Extremity Assessment Upper Extremity Assessment: Generalized weakness(LUE<RUE grossly 1/5)    Lower Extremity Assessment Lower Extremity Assessment: Generalized weakness(LLE<RLE, grossly 2/5)       Communication      Cognition Arousal/Alertness: Awake/alert Behavior During Therapy: Agitated Overall Cognitive Status: Difficult to assess  General Comments      Exercises     Assessment/Plan    PT Assessment Patient needs continued PT services  PT Problem List Decreased strength       PT Treatment Interventions DME instruction;Gait training;Stair training;Functional mobility training;Therapeutic exercise;Therapeutic activities;Balance training    PT Goals (Current goals can be found in the Care Plan section)  Acute Rehab PT Goals Patient Stated Goal: non stated PT Goal Formulation: With patient Time For Goal Achievement: 02/25/18 Potential to Achieve Goals: Poor    Frequency Min 2X/week   Barriers to discharge        Co-evaluation               AM-PAC PT "6 Clicks" Mobility  Outcome Measure Help needed turning from your back to your side while in a flat bed without  using bedrails?: Total Help needed moving from lying on your back to sitting on the side of a flat bed without using bedrails?: Total Help needed moving to and from a bed to a chair (including a wheelchair)?: Total Help needed standing up from a chair using your arms (e.g., wheelchair or bedside chair)?: Total Help needed to walk in hospital room?: Total Help needed climbing 3-5 steps with a railing? : Total 6 Click Score: 6    End of Session Equipment Utilized During Treatment: Oxygen Activity Tolerance: Patient limited by fatigue Patient left: in bed;with call bell/phone within reach Nurse Communication: Mobility status PT Visit Diagnosis: Muscle weakness (generalized) (M62.81)    Time: 0037-0488 PT Time Calculation (min) (ACUTE ONLY): 20 min   Charges:   PT Evaluation $PT Eval High Complexity: 1 High         Reinaldo Berber, PT, DPT Acute Rehabilitation Services Pager: [phone] Office: [phone]    Reinaldo Berber 02/11/2018, 9:34 AM

## 2018-02-12 ENCOUNTER — Inpatient Hospital Stay (HOSPITAL_COMMUNITY): Payer: Medicare Other

## 2018-02-12 LAB — BASIC METABOLIC PANEL
ANION GAP: 14 (ref 5–15)
BUN: 23 mg/dL (ref 8–23)
CO2: 21 mmol/L — ABNORMAL LOW (ref 22–32)
Calcium: 9.1 mg/dL (ref 8.9–10.3)
Chloride: 112 mmol/L — ABNORMAL HIGH (ref 98–111)
Creatinine, Ser: 0.75 mg/dL (ref 0.61–1.24)
GFR calc Af Amer: >60 mL/min (ref >60)
GFR calc non Af Amer: >60 mL/min (ref >60)
Glucose, Bld: 73 mg/dL (ref 70–99)
Potassium: 4.6 mmol/L (ref 3.5–5.1)
Sodium: 147 mmol/L — ABNORMAL HIGH (ref 135–145)

## 2018-02-12 LAB — POCT I-STAT 3, ART BLOOD GAS (G3+)
Acid-Base Excess: 6 mmol/L — ABNORMAL HIGH (ref 0.0–2.0)
Bicarbonate: 27.1 mmol/L (ref 20.0–28.0)
O2 Saturation: 99 %
PH ART: 7.596 — AB (ref 7.350–7.450)
Patient temperature: 97.8
TCO2: 28 mmol/L (ref 22–32)
pCO2 arterial: 27.7 mmHg — ABNORMAL LOW (ref 32.0–48.0)
pO2, Arterial: 128 mmHg — ABNORMAL HIGH (ref 83.0–108.0)

## 2018-02-12 LAB — GLUCOSE, CAPILLARY
GLUCOSE-CAPILLARY: 76 mg/dL (ref 70–99)
Glucose-Capillary: 84 mg/dL (ref 70–99)
Glucose-Capillary: 69 mg/dL — ABNORMAL LOW (ref 70–99)
Glucose-Capillary: 136 mg/dL — ABNORMAL HIGH (ref 70–99)
Glucose-Capillary: 90 mg/dL (ref 70–99)
Glucose-Capillary: 91 mg/dL (ref 70–99)
Glucose-Capillary: 83 mg/dL (ref 70–99)

## 2018-02-12 LAB — CBC
HCT: 39.4 % (ref 39.0–52.0)
HEMOGLOBIN: 13.2 g/dL (ref 13.0–17.0)
MCH: 30.1 pg (ref 26.0–34.0)
MCHC: 33.5 g/dL (ref 30.0–36.0)
MCV: 90 fL (ref 80.0–100.0)
Platelets: 149 10*3/uL — ABNORMAL LOW (ref 150–400)
RBC: 4.38 MIL/uL (ref 4.22–5.81)
RDW: 16.5 % — ABNORMAL HIGH (ref 11.5–15.5)
WBC: 6.9 10*3/uL (ref 4.0–10.5)
nRBC: 0 % (ref 0.0–0.2)

## 2018-02-12 LAB — PHOSPHORUS: Phosphorus: 1.4 mg/dL — ABNORMAL LOW (ref 2.5–4.6)

## 2018-02-12 LAB — MAGNESIUM: Magnesium: 1.8 mg/dL (ref 1.7–2.4)

## 2018-02-12 MED ORDER — SODIUM PHOSPHATES 45 MMOLE/15ML IV SOLN
20.0000 mmol | Freq: Once | INTRAVENOUS | Status: AC
  Administered 2018-02-12: 20 mmol via INTRAVENOUS
  Filled 2018-02-12: qty 6.67

## 2018-02-12 MED ORDER — SODIUM CHLORIDE 0.9 % IV SOLN
2.0000 g | Freq: Three times a day (TID) | INTRAVENOUS | Status: DC
  Administered 2018-02-12 – 2018-02-13 (×3): 2 g via INTRAVENOUS
  Filled 2018-02-12 (×5): qty 2

## 2018-02-12 MED ORDER — VITAL AF 1.2 CAL PO LIQD
1000.0000 mL | ORAL | Status: DC
  Administered 2018-02-13: 1000 mL

## 2018-02-12 MED ORDER — PRO-STAT SUGAR FREE PO LIQD
30.0000 mL | Freq: Three times a day (TID) | ORAL | Status: DC
  Administered 2018-02-12 – 2018-02-14 (×6): 30 mL via ORAL
  Filled 2018-02-12 (×5): qty 30

## 2018-02-12 MED ORDER — DEXTROSE 50 % IV SOLN
50.0000 mL | Freq: Once | INTRAVENOUS | Status: AC
  Administered 2018-02-12: 50 mL via INTRAVENOUS

## 2018-02-12 MED ORDER — DEXTROSE 50 % IV SOLN
INTRAVENOUS | Status: AC
  Administered 2018-02-12: 50 mL via INTRAVENOUS
  Filled 2018-02-12: qty 50

## 2018-02-12 MED ORDER — ATROPINE SULFATE 1 MG/10ML IJ SOSY
PREFILLED_SYRINGE | INTRAMUSCULAR | Status: AC
  Filled 2018-02-12: qty 10

## 2018-02-12 NOTE — Progress Notes (Signed)
NAME:  Patrick Holt, MRN:  [id number], DOB:  1950-07-04, LOS: 3 ADMISSION DATE:  01/17/2018, CONSULTATION DATE:  02/09/2018 REFERRING MD:  [name] Carnes, PA, CHIEF COMPLAINT:  Altered mental status  Brief History   86 yoM w/extensive hx presenting from SNF with AMS, septic shock secondary to pneumococcus pneumonia.  Placed on BiPAP and Levophed and admitted to ICU.  Past Medical History  Anemia, Anxiety, Asthma, Atrial fibrillation on Eliquis [facility]) (01/2012), Bipolar 1 disorder ([place]), Cancer ([place]), Collagen vascular disease ([place]), Conversion disorder (06/2010), COPD (chronic obstructive pulmonary disease) ([place]), CVA (cerebral vascular accident) ([place]), Dermatomyositis ([place]), Dermatomyositis ([place]), Diverticulitis, Diverticulosis of colon, Dysrhythmia, Esophageal dysmotility, Esophageal stricture, Fibromyalgia, Gastritis, GERD (gastroesophageal reflux disease), Hand fracture, right, Headache(784.0), Hiatal hernia, History of narcotic addiction ([place]), adenomatous colonic polyps, Hyperlipidemia, Hypertension, Internal hemorrhoids, Ischemic heart disease, Major depression, Myocardial infarct ([place]), Narcotic dependence ([place]), Nephrolithiasis, Obesity, OCD (obsessive compulsive disorder), Otosclerosis, Paroxysmal A-fib ([place]), Peripheral neuropathy, Raynaud's disease, Renal insufficiency, Rheumatoid arthritis(714.0), Sarcoidosis, Seizures ([place]), Subarachnoid hemorrhage ([place]) (01/2012), Tremor, Type II diabetes mellitus ([place]), Urge incontinence, and Vertigo.  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 rate 19, height _0  (1.753 m), weight 86.1 kg, SpO2 100 %.    Vent Mode: PRVC FiO2 (%):  [60 %-100 %] 60 % Set Rate:  [16 bmp-20 bmp] 16 bmp Vt Set:  [560 mL] 560 mL PEEP:  [10 cmH20] 10 cmH20 Plateau Pressure:  [9 YJE56-31 cmH20] 28 cmH20   Intake/Output Summary (Last 24 hours) at 02/12/2018 1008 Last data filed at 02/12/2018 0800 Gross per 24 hour  Intake 1859.26 ml  Output 2270 ml  Net -410.74 ml   Filed Weights   02/11/18 0500 02/11/18 1459 02/12/18 0500  Weight: 87.7 kg 84.6 kg 86.1 kg   Examination: Gen:      Acutely ill appearing male, NAD, sedate on vent HEENT:  [place]/AT, PERRL, EOM-I and MMM Neck:      No JVD, thyromegaly or LAN Lungs:    Coarse BS diffusely CV:         RRR, Nl S1/S2 and -M/R/G Abd:      Soft, NT, ND and +BS Ext:    -edema and -tenderness Skin:       Intact Neuro:    Awake and interactive, moving all ext to command  Resolved Hospital Problem list    Assessment & Plan:  Septic shock P:  Continue pressors as needed F/U on BAL culture Abx as above  Acute on chronic hypoxic respiratory failure Pneumococcus pneumonia. Hx COPD, tobacco abuse, sarcoid (not on home O2), also noted some component of probable pulmonary hypertension given PASP 51 on TTE 10/2017  Left sided mucous plug P:  Full vent support F/u on BAL cultures Duonebs as ordered Brovanna/ pulmicort nebs BID CXR and ABG Adjust vent for ABG  Acute encephalopathy - presumed due to sepsis vs postictal from seizure as acute infection could lower his threshold,  vs psych component vs CNS process  Hx seizures, conversion disorder, bipolar  P:  Continue keppra and valproic acid Seizure precautions Neuro checks Holding home xanax, sinemet, Risperdal  Fentanyl drip No benzos  AKI P:  Continue  TF Free water 250 ml q6 via NGT  Prolonged QTc Afib on Eliquis - currently rate controlled P:  Tele monitoring Avoid QTc meds Holding home amiodarone, inderal Holding Eliquis for now, when more alert, consider restarting vs transitioning to heparin gtt  Hyperglycemia P:  CBG q 4  RA/ sarcoidosis P  Continue IV Solu-Medrol for now Convert to home dose of prednisone when able to take p.o.  HLD P:  Resume lipitor when mental status improves and able to swallow   Protein calorie malnutrition with previous failure to thrive P:  Keep n.p.o. Speech and swallow eval once extubated  The patient is critically ill with multiple organ systems failure and requires high complexity decision making for assessment and support, frequent evaluation and titration of therapies, application of advanced monitoring technologies and extensive interpretation of multiple databases.   Critical Care Time devoted to patient care services described in this note is  32  Minutes. This time reflects time of care of this signee Dr [name]. This critical care time does not reflect procedure time, or teaching time or supervisory time of PA/NP/Med student/Med Resident etc but could involve care discussion time.  Rush Farmer, M.D. [facility] Pulmonary/Critical Care Medicine. Pager: [phone]. After hours pager: [phone].

## 2018-02-12 NOTE — Progress Notes (Signed)
Hypoglycemic Event  CBG: 69  Treatment: 1 amp D50  Symptoms: none  Follow-up CBG: Time: 0920 CBG Result: 137  Possible Reasons for Event: NPO status  Comments/MD notified: Markus Jarvis

## 2018-02-13 DIAGNOSIS — J13 Pneumonia due to Streptococcus pneumoniae: Secondary | ICD-10-CM

## 2018-02-13 LAB — GLUCOSE, CAPILLARY
Glucose-Capillary: 85 mg/dL (ref 70–99)
Glucose-Capillary: 72 mg/dL (ref 70–99)
Glucose-Capillary: 100 mg/dL — ABNORMAL HIGH (ref 70–99)
Glucose-Capillary: 82 mg/dL (ref 70–99)
Glucose-Capillary: 119 mg/dL — ABNORMAL HIGH (ref 70–99)
Glucose-Capillary: 108 mg/dL — ABNORMAL HIGH (ref 70–99)
Glucose-Capillary: 116 mg/dL — ABNORMAL HIGH (ref 70–99)

## 2018-02-13 MED ORDER — DIPHENHYDRAMINE HCL 12.5 MG/5ML PO ELIX
25.0000 mg | ORAL_SOLUTION | Freq: Once | ORAL | Status: AC
  Administered 2018-02-13: 25 mg
  Filled 2018-02-13 (×2): qty 10

## 2018-02-13 MED ORDER — SODIUM CHLORIDE 0.9 % IV SOLN
1.0000 g | Freq: Three times a day (TID) | INTRAVENOUS | Status: DC
  Administered 2018-02-13 – 2018-02-15 (×5): 1 g via INTRAVENOUS
  Filled 2018-02-13 (×7): qty 1

## 2018-02-13 MED ORDER — POTASSIUM PHOSPHATE MONOBASIC 500 MG PO TABS
500.0000 mg | ORAL_TABLET | Freq: Three times a day (TID) | ORAL | Status: DC
  Filled 2018-02-13 (×2): qty 1

## 2018-02-13 MED ORDER — SODIUM CHLORIDE 0.9 % IV SOLN
1.0000 g | Freq: Three times a day (TID) | INTRAVENOUS | Status: DC
  Administered 2018-02-13: 1 g via INTRAVENOUS
  Filled 2018-02-13 (×3): qty 1

## 2018-02-13 MED ORDER — ATORVASTATIN CALCIUM 40 MG PO TABS
40.0000 mg | ORAL_TABLET | Freq: Every day | ORAL | Status: DC
  Administered 2018-02-13 – 2018-02-16 (×4): 40 mg
  Filled 2018-02-13 (×7): qty 1

## 2018-02-13 MED ORDER — K PHOS MONO-SOD PHOS DI & MONO 155-852-130 MG PO TABS
500.0000 mg | ORAL_TABLET | Freq: Two times a day (BID) | ORAL | Status: AC
  Administered 2018-02-13 – 2018-02-14 (×4): 500 mg
  Filled 2018-02-13 (×4): qty 2

## 2018-02-13 MED ORDER — VITAL AF 1.2 CAL PO LIQD
1000.0000 mL | ORAL | Status: DC
  Administered 2018-02-13 – 2018-02-14 (×2): 1000 mL

## 2018-02-13 MED ORDER — RISPERIDONE 1 MG PO TABS
1.0000 mg | ORAL_TABLET | Freq: Two times a day (BID) | ORAL | Status: DC
  Administered 2018-02-13 – 2018-02-17 (×9): 1 mg
  Filled 2018-02-13 (×9): qty 1

## 2018-02-13 MED ORDER — FUROSEMIDE 10 MG/ML IJ SOLN
20.0000 mg | Freq: Four times a day (QID) | INTRAMUSCULAR | Status: AC
  Administered 2018-02-13 (×2): 20 mg via INTRAVENOUS
  Filled 2018-02-13 (×2): qty 2

## 2018-02-13 MED ORDER — CLONAZEPAM 1 MG PO TABS
1.0000 mg | ORAL_TABLET | Freq: Two times a day (BID) | ORAL | Status: DC
  Administered 2018-02-13 – 2018-02-16 (×7): 1 mg
  Filled 2018-02-13 (×7): qty 1

## 2018-02-13 MED ORDER — APIXABAN 5 MG PO TABS
5.0000 mg | ORAL_TABLET | Freq: Two times a day (BID) | ORAL | Status: DC
  Administered 2018-02-13 – 2018-02-17 (×9): 5 mg
  Filled 2018-02-13 (×13): qty 1

## 2018-02-13 MED ORDER — CARBIDOPA-LEVODOPA 25-100 MG PO TABS
1.0000 | ORAL_TABLET | Freq: Two times a day (BID) | ORAL | Status: DC
  Administered 2018-02-13 – 2018-02-17 (×9): 1
  Filled 2018-02-13 (×13): qty 1

## 2018-02-13 NOTE — Progress Notes (Signed)
NAME:  Patrick Holt, MRN:  [id number], DOB:  01-Mar-1950, LOS: 4 ADMISSION DATE:  01/21/2018, CONSULTATION DATE:  02/09/2018 REFERRING MD:  [name] Carnes, PA, CHIEF COMPLAINT:  Altered mental status  Brief History   1 yoM w/extensive hx presenting from SNF with AMS, septic shock secondary to pneumococcus pneumonia.  Placed on BiPAP and Levophed and admitted to ICU.  Past Medical History  Anemia, Anxiety, Asthma, Atrial fibrillation on Eliquis [facility]) (01/2012), Bipolar 1 disorder ([place]), Cancer ([place]), Collagen vascular disease ([place]), Conversion disorder (06/2010), COPD (chronic obstructive pulmonary disease) ([place]), CVA (cerebral vascular accident) ([place]), Dermatomyositis ([place]), Dermatomyositis ([place]), Diverticulitis, Diverticulosis of colon, Dysrhythmia, Esophageal dysmotility, Esophageal stricture, Fibromyalgia, Gastritis, GERD (gastroesophageal reflux disease), Hand fracture, right, Headache(784.0), Hiatal hernia, History of narcotic addiction ([place]), adenomatous colonic polyps, Hyperlipidemia, Hypertension, Internal hemorrhoids, Ischemic heart disease, Major depression, Myocardial infarct ([place]), Narcotic dependence ([place]), Nephrolithiasis, Obesity, OCD (obsessive compulsive disorder), Otosclerosis, Paroxysmal A-fib ([place]), Peripheral neuropathy, Raynaud's disease, Renal insufficiency, Rheumatoid arthritis(714.0), Sarcoidosis, Seizures ([place]), Subarachnoid hemorrhage ([place]) (01/2012), Tremor, Type II diabetes mellitus ([place]), Urge incontinence, and Vertigo.  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 rate 14, height _0  (1.753 m), weight 86.2 kg, SpO2 97 %.    Vent Mode: PRVC FiO2 (%):  [60 %] 60 % Set Rate:  [16 bmp] 16 bmp Vt Set:  [560 mL] 560 mL PEEP:  [10 cmH20] 10 cmH20 Plateau Pressure:  [17 cmH20-21 cmH20] 20 cmH20   Intake/Output Summary (Last 24 hours) at 02/13/2018 1052 Last data filed at 02/13/2018 0600 Gross per 24 hour  Intake 2203.53 ml  Output 625 ml  Net 1578.53 ml   Filed Weights   02/11/18 1459 02/12/18 0500 02/13/18 0500  Weight: 84.6 kg 86.1 kg 86.2 kg   Examination:  Gen: well appearing HENT: OP clear, TM's clear, neck supple PULM: CTA B, normal percussion CV: RRR, no mgr, trace edema GI: BS+, soft, nontender Derm: no cyanosis or rash Psyche: normal mood and affect    Resolved Hospital Problem list    Assessment & Plan:  Septic shock: weaned off 12/30 AM P:  Wean off levophed for MAP > 65 Monitor hemodynamics  Acute on chronic hypoxic respiratory failure Pneumococcus pneumonia Enterobacter pneumonia Hx COPD, tobacco abuse, sarcoid (not on home O2), also noted some component of probable pulmonary hypertension given PASP 51 on TTE 10/2017  Left sided mucous plug Acute pulmonary edema and anasarca P:  Continue full vent support with daily SBT/WUA Brovana/pulmicort Pulmonary toilette measures duonebs to continue VAP prevention Diuresis today Stop vanc, change to cefepime (has tolerated in past)  Acute toxic metabolic encephalopathy - MRI negative, ICU delirium Hx seizures, conversion disorder, bipolar  Severe anxiety here P:  Continue keppra and valproate Restart home  Sinemet, risperdone Hold home xanax Start  clonazepam PAD protocol  AKI P:  Continue tube feeding Continue medical stress ulcer prophylaxis Continue free water  Prolonged QTc Afib on Eliquis, has episodes of bradycardia  - currently rate controlled P:  Restart eliquis Repeat EKG to monitor QTc Holding home b-blocker and amiodarone   Hyperglycemia P:  Monitor glucose  RA/ sarcoidosis P  Continue prednisone 2.49m daily here (Home dose)  HLD P:  Restart statin   Protein calorie malnutrition with previous failure to thrive P:  Continue Tube feeding   My cc time 31 minutes  BRoselie Awkward MD [facility]CCM Pager: [phone]: [phone] no response, call [phone]

## 2018-02-13 NOTE — Progress Notes (Signed)
Pharmacy Antibiotic Note  Patrick Holt is a 67 y.o. male admitted on 01/23/2018 with AMS, hypotension, and hypoxia.  Pt has multiple allergies and is being switched from Cefepime due to the development of a diffuse rash following administration.  Pt has urinary strep antigen positive and BAL with enterobacter + proteus. Give allergies and susceptibility pattern, best option is a carbapenem.  Noted hx of seizures and carbapenems do have a risk of lowering the seizure threshold (however this is most notable in the literature with high doses of Imipenem).    Will start meropenem.  I have spoke with RN and advised to watch for seizure and worsening of rash.  Plan: Meropenem 1gm IV q8h.  Height: _0  (175.3 cm) Weight: 190 lb 0.6 oz (86.2 kg) IBW/kg (Calculated) : 70.7  Temp (24hrs), Avg:98.1 F (36.7 C), Min:97.8 F (36.6 C), Max:98.4 F (36.9 C)  Recent Labs  Lab 01/26/2018 2330 02/09/2018 2342 02/09/18 0503 02/09/18 0510 02/11/18 0240 02/12/18 0408  WBC 8.1  --   --  10.6* 8.2 6.9  CREATININE 1.69*  --  1.78*  --  0.68 0.75  LATICACIDVEN  --  1.64  --   --   --   --     Estimated Creatinine Clearance: 96.1 mL/min (by C-G formula based on SCr of 0.75 mg/dL).    Allergies  Allergen Reactions  . Immune Globulins Other (See Comments)    Acute renal failure  . Rho (D) Immune Globulin     Acute renal failure Acute renal failure  . Ciprofloxacin Swelling  . Flagyl [Metronidazole] Swelling  . Sulfa Antibiotics Other (See Comments)    blisters  . Sulfasalazine     blisters blisters  . Vancomycin     Diffuse maculopapular rash when receiving both vanc/cefepime.  Timing unclear.    . Bupropion     Unknown  . Bupropion Hcl Other (See Comments)    Unknown  . Cefepime Rash    Diffuse maculopapular rash when receiving both vanc and cefepime, timing unclear.  Challenged with Cefepime again 01/2018 and developed full body rash.  Tolerated ceftriaxone and ceftazidime 01/2018.  .  Clobetasol Other (See Comments)    Unknown Unknown  . Escitalopram Oxalate     Unknown  . Fluoxetine Hcl     Unknown  . Furosemide Other (See Comments)    Unknown Unknown  . Meclizine Rash  . Paroxetine Other (See Comments)    Unknown  . Penicillins Other (See Comments)    Unknown reaction - Tolerated ceftriaxone 01/2018 Has patient had a PCN reaction causing immediate rash, facial/tongue/throat swelling, SOB or lightheadedness with hypotension: unknown Has patient had a PCN reaction causing severe rash involving mucus membranes or skin necrosis: unknown Has patient had a PCN reaction that required hospitalization unknown Has patient had a PCN reaction occurring within the last 10 years: unknown If all of the above answers are "NO", then may proceed with Cephalospori  . Tacrolimus Other (See Comments)    Unknown  . Tetanus Toxoid Other (See Comments)    Unknown  . Tuberculin Purified Protein Derivative Other (See Comments)    Unknown    Antimicrobials this admission: Vanc 12/26 >> 12/26 Zosyn 12/26 >> 12/26 Ceftriaxone 12/26 >> 12/29 Ceftaz 12/29  >> 12/30 Cefepime 12/30 >> developed diffuse rash Merrem 12/30 >>  Dose adjustments this admission:   Microbiology results: 12/26 MRSA PCR: neg 12/26 Ucx: neg 12/25 Bcx: ngtd 12/26 RVP: neg 12/26 urinary strep>>pos 12/28 BAL 20K:  enterobacter cloacae (S: cefepime, cipro, gent, imip, septra, R: ancef, ceftaz, ctx, zosyn)                    1K: proteus mirabilis    Thank you for allowing pharmacy to be a part of this patient's care.  [employer], Pharm.D., BCPS Clinical Pharmacist  **Pharmacist phone directory can now be found on [email or web address] ([email or web address]).  Listed under [place].  02/13/2018 6:13 PM

## 2018-02-13 NOTE — Progress Notes (Signed)
SLP Cancellation Note  Patient Details Name: Patrick Holt MRN: [id number] DOB: 1950/09/30   Cancelled treatment:       Reason Eval/Treat Not Completed: Medical issues which prohibited therapy - pt intubated. Will sign off for now. Please reorder SLP post-extubation.   Germain Osgood 02/13/2018, 8:33 AM  Germain Osgood, M.A. [place] Acute Environmental education officer [phone] Office [phone]

## 2018-02-14 ENCOUNTER — Inpatient Hospital Stay (HOSPITAL_COMMUNITY): Payer: Medicare Other

## 2018-02-14 DIAGNOSIS — J156 Pneumonia due to other aerobic Gram-negative bacteria: Secondary | ICD-10-CM

## 2018-02-14 DIAGNOSIS — L270 Generalized skin eruption due to drugs and medicaments taken internally: Secondary | ICD-10-CM

## 2018-02-14 DIAGNOSIS — E440 Moderate protein-calorie malnutrition: Secondary | ICD-10-CM

## 2018-02-14 LAB — CBC WITH DIFFERENTIAL/PLATELET
Abs Immature Granulocytes: 1.24 10*3/uL — ABNORMAL HIGH (ref 0.00–0.07)
Basophils Absolute: 0.1 10*3/uL (ref 0.0–0.1)
Basophils Relative: 1 %
Eosinophils Absolute: 0.1 10*3/uL (ref 0.0–0.5)
Eosinophils Relative: 2 %
HCT: 39.2 % (ref 39.0–52.0)
Hemoglobin: 12.6 g/dL — ABNORMAL LOW (ref 13.0–17.0)
IMMATURE GRANULOCYTES: 15 %
LYMPHS ABS: 1.7 10*3/uL (ref 0.7–4.0)
Lymphocytes Relative: 21 %
MCH: 29.9 pg (ref 26.0–34.0)
MCHC: 32.1 g/dL (ref 30.0–36.0)
MCV: 92.9 fL (ref 80.0–100.0)
Monocytes Absolute: 0.4 10*3/uL (ref 0.1–1.0)
Monocytes Relative: 5 %
Neutro Abs: 4.4 10*3/uL (ref 1.7–7.7)
Neutrophils Relative %: 56 %
Platelets: 165 10*3/uL (ref 150–400)
RBC: 4.22 MIL/uL (ref 4.22–5.81)
RDW: 17.5 % — ABNORMAL HIGH (ref 11.5–15.5)
WBC Morphology: INCREASED
WBC: 8.1 10*3/uL (ref 4.0–10.5)
nRBC: 0 % (ref 0.0–0.2)

## 2018-02-14 LAB — GLUCOSE, CAPILLARY
GLUCOSE-CAPILLARY: 166 mg/dL — AB (ref 70–99)
Glucose-Capillary: 122 mg/dL — ABNORMAL HIGH (ref 70–99)
Glucose-Capillary: 135 mg/dL — ABNORMAL HIGH (ref 70–99)
Glucose-Capillary: 137 mg/dL — ABNORMAL HIGH (ref 70–99)
Glucose-Capillary: 142 mg/dL — ABNORMAL HIGH (ref 70–99)
Glucose-Capillary: 157 mg/dL — ABNORMAL HIGH (ref 70–99)

## 2018-02-14 LAB — BASIC METABOLIC PANEL
Anion gap: 10 (ref 5–15)
BUN: 19 mg/dL (ref 8–23)
CO2: 28 mmol/L (ref 22–32)
Calcium: 8.3 mg/dL — ABNORMAL LOW (ref 8.9–10.3)
Chloride: 107 mmol/L (ref 98–111)
Creatinine, Ser: 0.61 mg/dL (ref 0.61–1.24)
GFR calc Af Amer: >60 mL/min (ref >60)
GFR calc non Af Amer: >60 mL/min (ref >60)
GLUCOSE: 128 mg/dL — AB (ref 70–99)
Potassium: 3.2 mmol/L — ABNORMAL LOW (ref 3.5–5.1)
Sodium: 145 mmol/L (ref 135–145)

## 2018-02-14 LAB — CULTURE, BLOOD (ROUTINE X 2)
Culture: NO GROWTH
Culture: NO GROWTH
Special Requests: ADEQUATE

## 2018-02-14 MED ORDER — LEVETIRACETAM 100 MG/ML PO SOLN
500.0000 mg | Freq: Two times a day (BID) | ORAL | Status: DC
  Administered 2018-02-14 – 2018-02-17 (×7): 500 mg
  Filled 2018-02-14 (×13): qty 5

## 2018-02-14 MED ORDER — POTASSIUM CHLORIDE 20 MEQ/15ML (10%) PO SOLN
40.0000 meq | Freq: Once | ORAL | Status: AC
  Administered 2018-02-14: 40 meq
  Filled 2018-02-14: qty 30

## 2018-02-14 MED ORDER — PREDNISONE 20 MG PO TABS
20.0000 mg | ORAL_TABLET | Freq: Every day | ORAL | Status: AC
  Administered 2018-02-14 – 2018-02-16 (×3): 20 mg
  Filled 2018-02-14 (×3): qty 1

## 2018-02-14 MED ORDER — INFLUENZA VAC SPLIT HIGH-DOSE 0.5 ML IM SUSY
0.5000 mL | PREFILLED_SYRINGE | INTRAMUSCULAR | Status: AC
  Administered 2018-02-15: 0.5 mL via INTRAMUSCULAR
  Filled 2018-02-14: qty 0.5

## 2018-02-14 MED ORDER — VALPROIC ACID 250 MG/5ML PO SOLN
500.0000 mg | Freq: Three times a day (TID) | ORAL | Status: DC
  Administered 2018-02-14 – 2018-02-17 (×9): 500 mg
  Filled 2018-02-14 (×9): qty 10

## 2018-02-14 MED ORDER — VITAL AF 1.2 CAL PO LIQD
1000.0000 mL | ORAL | Status: DC
  Administered 2018-02-14 – 2018-02-17 (×5): 1000 mL

## 2018-02-14 MED ORDER — BUMETANIDE 1 MG PO TABS
1.0000 mg | ORAL_TABLET | Freq: Two times a day (BID) | ORAL | Status: AC
  Administered 2018-02-14 (×2): 1 mg
  Filled 2018-02-14 (×2): qty 1

## 2018-02-14 MED ORDER — PREDNISONE 2.5 MG PO TABS
2.5000 mg | ORAL_TABLET | Freq: Every day | ORAL | Status: DC
  Administered 2018-02-17: 2.5 mg
  Filled 2018-02-14 (×4): qty 1

## 2018-02-14 NOTE — Progress Notes (Signed)
NAME:  Patrick Holt, MRN:  [id number], DOB:  1950-09-17, LOS: 5 ADMISSION DATE:  02/09/2018, CONSULTATION DATE:  02/09/2018 REFERRING MD:  [name] Carnes, PA, CHIEF COMPLAINT:  Altered mental status  Brief History   77 yoM w/extensive hx presenting from SNF with AMS, septic shock secondary to pneumococcus pneumonia.  Placed on BiPAP and Levophed and admitted to ICU.  Past Medical History  Anemia, Anxiety, Asthma, Atrial fibrillation on Eliquis [facility]) (01/2012), Bipolar 1 disorder ([place]), Cancer ([place]), Collagen vascular disease ([place]), Conversion disorder (06/2010), COPD (chronic obstructive pulmonary disease) ([place]), CVA (cerebral vascular accident) ([place]), Dermatomyositis ([place]), Dermatomyositis ([place]), Diverticulitis, Diverticulosis of colon, Dysrhythmia, Esophageal dysmotility, Esophageal stricture, Fibromyalgia, Gastritis, GERD (gastroesophageal reflux disease), Hand fracture, right, Headache(784.0), Hiatal hernia, History of narcotic addiction ([place]), adenomatous colonic polyps, Hyperlipidemia, Hypertension, Internal hemorrhoids, Ischemic heart disease, Major depression, Myocardial infarct ([place]), Narcotic dependence ([place]), Nephrolithiasis, Obesity, OCD (obsessive compulsive disorder), Otosclerosis, Paroxysmal A-fib ([place]), Peripheral neuropathy, Raynaud's disease, Renal insufficiency, Rheumatoid arthritis(714.0), Sarcoidosis, Seizures ([place]), Subarachnoid hemorrhage ([place]) (01/2012), Tremor, Type II diabetes mellitus ([place]), Urge incontinence, and Vertigo.  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 rate 16, height _0  (1.753 m), weight 86.2 kg, SpO2 92 %.    Vent Mode: PRVC FiO2 (%):  [40 %-60 %] 60 % Set Rate:  [16 bmp] 16 bmp Vt Set:  [560 mL] 560 mL PEEP:  [8 cmH20-10 cmH20] 10 cmH20 Plateau Pressure:  [12 XTG62-69 cmH20] 20 cmH20   Intake/Output Summary (Last 24 hours) at 02/14/2018 4854 Last data filed at 02/14/2018 0600 Gross per 24 hour  Intake 2534.6 ml  Output 2605 ml  Net -70.4 ml   Filed Weights   02/12/18 0500 02/13/18 0500 02/14/18 0500  Weight: 86.1 kg 86.2 kg 86.2 kg   Examination:  General:  In bed on vent HENT: NCAT ETT in place PULM: CTA B, vent supported breathing CV: RRR, no mgr GI: BS+, soft, nontender MSK: normal bulk and tone Derm: macular/papular rash over torso/abdomen extending to face, non-desquamating, edema noted hands/legs Neuro: sedated on vent      Resolved Hospital Problem list   AKI QTc prolongation  Assessment & Plan:  Septic shock: weaned off 12/30 AM, now requiring some levophed 12/31 due to sedation P:  Wean off levophed for MAP > 65  Acute on chronic hypoxic respiratory failure Pneumococcus pneumonia Enterobacter pneumonia Hx COPD, tobacco abuse, sarcoid (not on home O2), also noted some component of probable pulmonary hypertension given PASP 51 on TTE 10/2017  Left sided mucous plug Acute pulmonary edema and anasarca P:  Continue full vent support, wean PEEP/FiO2 Diurese today with bumetanide Pulm toilette Use meropenem (see rash discussion) VAP prevention Continue bronchodilators and  inhaled  steroids as ordered   Acute toxic metabolic encephalopathy - MRI negative, ICU delirium Hx seizures, conversion disorder, bipolar  Severe anxiety here P:  Change valproate to via tube Continue keppra to via tube Continue sinemet, risperdone Continue clonazepam PAD protocol  Rash: presumably drug rash, most meds started yesterday had been used at home or recently tolerated, suspect furosemide most likely cause P: Increase prednisone 28m daily x 3 days to help with rash Hold furosemide Change from cefepime to mero (though suspect furosemide most likely cause)  AKI> resolved P:  Continue free water Monitor BMET and UOP Replace electrolytes as needed  Prolonged QTc: resolved Afib on Eliquis, has episodes of bradycardia  - currently rate controlled P:  Continue eliquis Hold home b-blocker and amiodarone with hypotension tele  Hyperglycemia P:  Monitor glucose  RA/ sarcoidosis P  Resume prednisone 2.557mdaily on 1/3  HLD P:  Continue home statin   Protein calorie malnutrition with previous failure to thrive P:  Continue tube feeding   My cc time 32 minutes  BrRoselie AwkwardMD Le[place]utsmanCCM Pager: [phone]: [phone] no response, call [phone]

## 2018-02-14 NOTE — Progress Notes (Signed)
Patient appears to be more red this morning. Elink MD was notified.

## 2018-02-14 NOTE — Progress Notes (Signed)
   02/14/18 0715  Vitals  BP (!) 155/98  MAP (mmHg) 114  Pulse Rate (!) 143  ECG Heart Rate (!) 139  Resp 18  Oxygen Therapy  SpO2 90 %  Patient has increased diffuse rash with severe agitation this morning and significant changes in vital signs.  Provider and pharmacy notified.

## 2018-02-14 NOTE — Progress Notes (Signed)
Nutrition Follow-up  DOCUMENTATION CODES:   Non-severe (moderate) malnutrition in context of chronic illness  INTERVENTION:    Increase Vital AF 1.2 to new goal rate of 70 ml/h (1680 ml per day)  D/C Pro-stat   Provides 2016 kcal, 126 gm protein, 1362 ml free water daily  NUTRITION DIAGNOSIS:   Moderate Malnutrition related to chronic illness, poor appetite as evidenced by percent weight loss, mild fat depletion.  Ongoing   GOAL:   Patient will meet greater than or equal to 90% of their needs  Met with TF  MONITOR:   Diet advancement, Vent status, Labs, Skin, TF tolerance  ASSESSMENT:   67 y/o male PMHx CHF, COPD, CVA seizure disorder, RA, bipolar, conversion disorder. Long term SNF resident. Presented w/ AMS, hypotension, hypoxia and fever in setting of presumed sepsis. Started on BiPAP and vasopressors.   Patient is intubated on ventilator support MV: 8.6 L/min Temp (24hrs), Avg:98.6 F (37 C), Min:98 F (36.7 C), Max:99.9 F (37.7 C)   Currently receiving Vital AF 1.2 via OGT at 65 ml/h (1560 ml/day) with Prostat 30 ml TID to provide 2172 kcals, 162 gm protein, 1265 ml free water daily. Tolerating TF without difficulty. Also receiving free water flushes 250 ml every 6 hours.  Labs reviewed. Potassium 3.2 (L) CBG: [phone] Medications reviewed and include Colace, K Phos, Levophed.   Diet Order:   Diet Order            Diet NPO time specified  Diet effective now              EDUCATION NEEDS:   No education needs have been identified at this time  Skin:  Skin Assessment: Skin Integrity Issues: Skin Integrity Issues:: DTI DTI: sacrum & L heel  Last BM:  12/28 (type 1)  Height:   Ht Readings from Last 1 Encounters:  02/07/2018 '5\' 9"'  (1.753 m)    Weight:   Wt Readings from Last 1 Encounters:  02/14/18 86.2 kg    Ideal Body Weight:  72.73 kg  BMI:  Body mass index is 28.06 kg/m.  Estimated Nutritional Needs:   Kcal:   1915  Protein:  120-140 gm  Fluid:  2 L    Molli Barrows, RD, LDN, [place] Pager [phone] After Hours Pager [phone]

## 2018-02-15 ENCOUNTER — Encounter: Payer: Self-pay | Admitting: Podiatry

## 2018-02-15 LAB — CBC WITH DIFFERENTIAL/PLATELET
Basophils Absolute: 0 10*3/uL (ref 0.0–0.1)
Basophils Relative: 0 %
Eosinophils Absolute: 0 10*3/uL (ref 0.0–0.5)
Eosinophils Relative: 0 %
HCT: 41.8 % (ref 39.0–52.0)
Hemoglobin: 13.2 g/dL (ref 13.0–17.0)
Lymphocytes Relative: 23 %
Lymphs Abs: 2.2 10*3/uL (ref 0.7–4.0)
MCH: 29.7 pg (ref 26.0–34.0)
MCHC: 31.6 g/dL (ref 30.0–36.0)
MCV: 93.9 fL (ref 80.0–100.0)
Monocytes Absolute: 0.6 10*3/uL (ref 0.1–1.0)
Monocytes Relative: 6 %
Neutro Abs: 6.8 10*3/uL (ref 1.7–7.7)
Neutrophils Relative %: 71 %
Platelets: 165 10*3/uL (ref 150–400)
RBC: 4.45 MIL/uL (ref 4.22–5.81)
RDW: 17.6 % — ABNORMAL HIGH (ref 11.5–15.5)
WBC: 9.6 10*3/uL (ref 4.0–10.5)
nRBC: 0 % (ref 0.0–0.2)

## 2018-02-15 LAB — CULTURE, BAL-QUANTITATIVE W GRAM STAIN
Culture: 20000 — AB
Gram Stain: NONE SEEN
Special Requests: NORMAL

## 2018-02-15 LAB — GLUCOSE, CAPILLARY
GLUCOSE-CAPILLARY: 128 mg/dL — AB (ref 70–99)
Glucose-Capillary: 112 mg/dL — ABNORMAL HIGH (ref 70–99)
Glucose-Capillary: 123 mg/dL — ABNORMAL HIGH (ref 70–99)
Glucose-Capillary: 158 mg/dL — ABNORMAL HIGH (ref 70–99)
Glucose-Capillary: 157 mg/dL — ABNORMAL HIGH (ref 70–99)
Glucose-Capillary: 151 mg/dL — ABNORMAL HIGH (ref 70–99)

## 2018-02-15 LAB — BASIC METABOLIC PANEL
Anion gap: 10 (ref 5–15)
BUN: 23 mg/dL (ref 8–23)
CO2: 30 mmol/L (ref 22–32)
Calcium: 8.3 mg/dL — ABNORMAL LOW (ref 8.9–10.3)
Chloride: 103 mmol/L (ref 98–111)
Creatinine, Ser: 0.6 mg/dL — ABNORMAL LOW (ref 0.61–1.24)
GFR calc Af Amer: >60 mL/min (ref >60)
Glucose, Bld: 121 mg/dL — ABNORMAL HIGH (ref 70–99)
Potassium: 3.7 mmol/L (ref 3.5–5.1)
Sodium: 143 mmol/L (ref 135–145)

## 2018-02-15 MED ORDER — BUMETANIDE 1 MG PO TABS
1.0000 mg | ORAL_TABLET | Freq: Four times a day (QID) | ORAL | Status: AC
  Administered 2018-02-15 (×2): 1 mg via ORAL
  Filled 2018-02-15 (×2): qty 1

## 2018-02-15 MED ORDER — AMIODARONE HCL 200 MG PO TABS
400.0000 mg | ORAL_TABLET | Freq: Every day | ORAL | Status: DC
  Filled 2018-02-15: qty 2

## 2018-02-15 NOTE — Progress Notes (Signed)
eLink Physician-Brief Progress Note Patient Name: Patrick Holt DOB: 25-Dec-1950 MRN: [id number]   Date of Service  02/15/2018  HPI/Events of Note  Diarrhea - Request for Flexiseal.   eICU Interventions  Will order Flexiseal.     Intervention Category Major Interventions: Other:  [place], [place] 02/15/2018, 5:31 AM

## 2018-02-15 NOTE — Progress Notes (Signed)
NAME:  Patrick Holt, MRN:  [id number], DOB:  07-05-1950, LOS: 6 ADMISSION DATE:  01/18/2018, CONSULTATION DATE:  02/09/2018 REFERRING MD:  [name] Carnes, PA, CHIEF COMPLAINT:  Altered mental status  Brief History   89 yoM w/extensive hx presenting from SNF with AMS, septic shock secondary to pneumococcus pneumonia.  Placed on BiPAP and Levophed and admitted to ICU.  Past Medical History  Anemia, Anxiety, Asthma, Atrial fibrillation on Eliquis [facility]) (01/2012), Bipolar 1 disorder ([place]), Cancer ([place]), Collagen vascular disease ([place]), Conversion disorder (06/2010), COPD (chronic obstructive pulmonary disease) ([place]), CVA (cerebral vascular accident) ([place]), Dermatomyositis ([place]), Dermatomyositis ([place]), Diverticulitis, Diverticulosis of colon, Dysrhythmia, Esophageal dysmotility, Esophageal stricture, Fibromyalgia, Gastritis, GERD (gastroesophageal reflux disease), Hand fracture, right, Headache(784.0), Hiatal hernia, History of narcotic addiction ([place]), adenomatous colonic polyps, Hyperlipidemia, Hypertension, Internal hemorrhoids, Ischemic heart disease, Major depression, Myocardial infarct ([place]), Narcotic dependence ([place]), Nephrolithiasis, Obesity, OCD (obsessive compulsive disorder), Otosclerosis, Paroxysmal A-fib ([place]), Peripheral neuropathy, Raynaud's disease, Renal insufficiency, Rheumatoid arthritis(714.0), Sarcoidosis, Seizures ([place]), Subarachnoid hemorrhage ([place]) (01/2012), Tremor, Type II diabetes mellitus ([place]), Urge incontinence, and Vertigo.  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 rate 16, height _0  (1.753 m), weight 88.7 kg, SpO2 98 %.    Vent Mode: PRVC FiO2 (%):  [40 %-50 %] 40 % Set Rate:  [16 bmp] 16 bmp Vt Set:  [560 mL] 560 mL PEEP:  [5 cmH20] 5 cmH20 Plateau Pressure:  [13 cmH20-18 cmH20] 13 cmH20   Intake/Output Summary (Last 24 hours) at 02/15/2018 1241 Last data filed at 02/15/2018 1200 Gross per 24 hour  Intake 5628.27 ml  Output 2075 ml  Net 3553.27 ml   Filed Weights   02/13/18 0500 02/14/18 0500 02/15/18 0345  Weight: 86.2 kg 86.2 kg 88.7 kg   Examination:  General:  In bed on vent HENT: NCAT ETT in place PULM: CTA B, vent supported breathing CV: RRR, no mgr GI: BS+, soft, nontender Derm: rash improved, still has significant edema Neuro: sedated on vent    Resolved Hospital Problem list   AKI QTc prolongation  Assessment & Plan:  Septic shock: nearly resolved, 1/1 sedation related hypotension P:  Wean off levophed for MAP > 65  Acute on chronic hypoxic respiratory failure Pneumococcus pneumonia Enterobacter pneumonia Hx COPD, tobacco abuse, sarcoid (not on home O2), also noted some component of probable pulmonary hypertension given PASP 51 on TTE 10/2017  Left sided mucous plug Acute pulmonary edema and anasarca P:  Full mechanical vent support VAP prevention Daily WUA/SBT Diurese today with bumetanide Pulm toilette VAP prevention D/C meropenem Continue bronchodilators   Acute toxic metabolic encephalopathy - MRI negative, ICU delirium Hx seizures, conversion disorder, bipolar  Severe anxiety here P:  Continue  valproate, keppra, sinemet, risperdone, clonazepam  PAD protocol  Rash: furosemide related, improved P: Prednisone 2m daily for 3 full days then 2.543mdaily Hold furosemide  AKI> resolved P:  Monitor BMET and UOP Replace electrolytes as needed  Prolonged QTc: resolved Afib on Eliquis, has episodes of bradycardia  - currently rate controlled P:  Continue eliquis Hold b-blocker and amiodarone with hypotension Tele  Hyperglycemia P:  Monitor glucose  RA/ sarcoidosis P  Prednisone 2.5 mg daily on 1/3  HLD P:  Continue home statin   Protein calorie malnutrition with previous failure to thrive P:  Continue tube feeding  I met with the patient's good friend who is holds power of attorney over his estate but not health care power of attorney.  She tells me that he does not want prolong resuscitating measures and showed me the five wishes form he completed a few years ago outlining this directive.  She says that his family has been estranged from him but he has two living siblings.  Will ask social work to try to contact his family, if unable then we will consider a one way extubation based on what has been previously documented.    Feeding: tube feding Analgesia: fentanyl prn Sedation: n/a Thromboprophylaxis: eliquis HOB >30 degrees Ulcer prophylaxis: famotidine Glucose control: monitor glucose   My cc time 31 minutes  BrRoselie AwkwardMD [place] PCCM Pager: [phone]: [phone] no response, call [phone]

## 2018-02-15 NOTE — Progress Notes (Signed)
Subjective: Patrick Holt presents today referred by Medicine, [facility] . He is a resident of [facility]. He presents to clinic with cc of painful, discolored, thick toenails which he is unable to trim himself. It is not clear whether he missed the facility podiatrist's last visit.   Past Medical History:  Diagnosis Date  . Anemia   . Anxiety   . Asthma   . Atrial fibrillation ([place]) 01/2012   with RVR  . Bipolar 1 disorder ([place])   . Cancer ([place])   . Collagen vascular disease ([place])   . Conversion disorder 06/2010  . COPD (chronic obstructive pulmonary disease) ([place])   . CVA (cerebral vascular accident) ([place])   . Dermatomyositis ([place])   . Dermatomyositis ([place])   . Diverticulitis   . Diverticulosis of colon   . Dysrhythmia    "irregular" (11/15/2012)  . Esophageal dysmotility   . Esophageal stricture   . Fibromyalgia   . Gastritis   . GERD (gastroesophageal reflux disease)   . Hand fracture, right    sept 13, 2017, still wearing splint as og 01-10-2017  . Headache(784.0)    "severe; get them often" (11/15/2012)  . Hiatal hernia   . History of narcotic addiction ([place])   . Hx of adenomatous colonic polyps   . Hyperlipidemia   . Hypertension   . Internal hemorrhoids   . Ischemic heart disease   . Major depression    with acute psychotic break in 06/2010  . Myocardial infarct ([place])    mulitple (1999, 2000, 2004)  . Narcotic dependence ([place])   . Nephrolithiasis   . Obesity   . OCD (obsessive compulsive disorder)   . Otosclerosis   . Paroxysmal A-fib ([place])   . Peripheral neuropathy   . Raynaud's disease   . Renal insufficiency   . Rheumatoid arthritis(714.0)   . Sarcoidosis   . Seizures ([employer])   . Subarachnoid hemorrhage ([place]) 01/2012   with subdural  hematoma.   . Tremor   . Type II diabetes mellitus ([place])   . Urge incontinence   . Vertigo     Patient Active Problem List   Diagnosis Date Noted  . Malnutrition of moderate degree  02/14/2018  . Acute respiratory failure ([place])   . Pressure injury of skin 02/10/2018  . Sepsis ([place]) 02/09/2018  . Gait abnormality 01/05/2018  . Urinary incontinence 01/05/2018  . Atrial fibrillation with RVR ([place])   . Aspiration pneumonia of both lungs due to gastric secretions ([place])   . Fall 11/02/2017  . History of pulmonary embolism 10/31/2017  . Left-sided weakness 10/31/2017  . Anasarca 10/11/2017  . Pulmonary embolism ([place]) 10/11/2017  . ARF (acute renal failure) ([place]) 09/17/2017  . Unstable angina ([place]) 06/16/2017  . L1 and L2 vertebral fracture ([place]) 04/06/2017  . Multiple rib fractures 04/06/2017  . Evaluation by psychiatric service required 03/07/2017  . Acute metabolic encephalopathy 40/98/1191  . Hypoglycemia 03/04/2017  . Acute respiratory failure with hypoxia ([place]) 11-20-202019  . Acute encephalopathy 11-20-202019  . Rhabdomyolysis 02/05/2017  . Acute respiratory failure with hypoxia and hypercapnia ([place]) 02/05/2017  . COPD with acute exacerbation ([place]) 02/05/2017  . Bipolar 1 disorder ([place]) 02/05/2017  . Anemia 02/05/2017  . Hypertension 02/05/2017  . Hand fracture, right 02/05/2017  . Narcotic dependence ([place]) 02/05/2017  . Pre-diabetes 02/05/2017  . PAF (paroxysmal atrial fibrillation) ([place]) 02/05/2017  . Frequent falls 02/05/2017  . History of dermatomyositis 02/05/2017  . Seizure disorder ([place]) 02/05/2017  .  Bilateral lower extremity edema 02/05/2017  . Pulmonary artery hypertension ([place]) 01/15/2017  . Altered mental status 01/13/2017  . Low oxygen saturation   . Pain in right wrist 02/27/2016  . Altered mental state 11/14/2015  . Severe dehydration 11/14/2015  . Hypernatremia 11/14/2015  . Difficulty in walking, not elsewhere classified   . Right wrist fracture   . Closed displaced fracture of ulnar styloid with routine healing   . Scapholunate ligament injury with no instability   . Falls frequently   . Hypoxia 11/02/2015  . Anxiety 06/06/2015  .  Impaired mobility 04/28/2015  . Lumbar spondylosis with myelopathy 03/07/2015  . Nocturia 12/03/2014  . Chronic venous insufficiency 09/23/2014  . Poor social situation 07/30/2014  . Schizophrenia, acute ([place])   . Schizoaffective disorder, bipolar type ([place])   . Decreased visual acuity 06/27/2014  . COPD exacerbation ([place])   . Right sided weakness 01/04/2014  . Essential hypertension, benign   . Atrial fibrillation, unspecified   . Radiculopathy of cervical region   . Polyuria 12/07/2013  . Erectile dysfunction 12/07/2013  . Diverticulitis 08/01/2013  . Acute diverticulitis 08/01/2013  . Hematochezia 06/04/2013  . Dehydration 11/09/2012  . Hypokalemia 10/03/2012  . Benign neoplasm of colon 09/27/2012  . Stricture and stenosis of esophagus 09/27/2012  . Diverticulitis of colon without hemorrhage 07/26/2012  . Polypharmacy 03/26/2012  . Seizures ([place]) 02/10/2012  . Atrial fibrillation ([place]) 02/05/2012  . Coronary atherosclerosis of native coronary artery 02/05/2012  . Radicular low back pain 03/09/2011  . History of seizure disorder 01/29/2011  . Irritable bowel syndrome (IBS) 06/24/2010  . Urge incontinence 06/19/2010  . Tobacco abuse 11/23/2008  . DEPRESSION, MAJOR 05/31/2008  . ISCHEMIC HEART DISEASE 05/31/2008  . RAYNAUD'S DISEASE 05/31/2008  . HIATAL HERNIA 05/31/2008  . Rheumatoid arthritis ([place]) 05/31/2008  . FIBROMYALGIA 05/31/2008  . Hyperlipidemia 01/26/2008  . Anxiety state 01/26/2008  . HEMORRHOIDS, INTERNAL 01/26/2008  . COPD (chronic obstructive pulmonary disease) ([place]) 01/26/2008  . ESOPHAGEAL MOTILITY DISORDER 01/26/2008  . GERD 01/26/2008  . Dermatomyositis ([place]) 01/26/2008    Past Surgical History:  Procedure Laterality Date  . BACK SURGERY    . CARDIAC CATHETERIZATION    . CARPAL TUNNEL RELEASE Bilateral   . CATARACT EXTRACTION W/ INTRAOCULAR LENS IMPLANT Left   . COLONOSCOPY N/A 09/27/2012   Procedure: COLONOSCOPY;  Surgeon: Lafayette Dragon, MD;   Location: [facility];  Service: Endoscopy;  Laterality: N/A;  . ESOPHAGOGASTRODUODENOSCOPY N/A 09/27/2012   Procedure: ESOPHAGOGASTRODUODENOSCOPY (EGD);  Surgeon: Lafayette Dragon, MD;  Location: [facility];  Service: Endoscopy;  Laterality: N/A;  . KNEE ARTHROSCOPY W/ MENISCAL REPAIR Left 2009  . LUMBAR DISC SURGERY    . LYMPH NODE DISSECTION Right 1970's   "neck; dr thought I had Hodgkins; turned out to be sarcoidosis" (11/15/2012)  . squamous papilloma   2010   removed by Dr. Constance Holster ENT, noted on tongue  . TONSILLECTOMY     Outpatient Medications   acetaminophen (TYLENOL) 325 MG tablet    albuterol (PROVENTIL) (2.5 MG/3ML) 0.083% nebulizer solution    ALPRAZolam (XANAX) 0.5 MG tablet    amiodarone (PACERONE) 400 MG tablet    apixaban (ELIQUIS) 5 MG TABS tablet    atorvastatin (LIPITOR) 40 MG tablet    benzonatate (TESSALON) 100 MG capsule    budesonide-formoterol (SYMBICORT) 160-4.5 MCG/ACT inhaler    divalproex (DEPAKOTE) 250 MG DR tablet    ergocalciferol (VITAMIN D2) 50000 units capsule    esomeprazole (NEXIUM) 40 MG capsule  fluconazole (DIFLUCAN) 100 MG tablet    guaiFENesin (MUCINEX) 600 MG 12 hr tablet    ipratropium-albuterol (DUONEB) 0.5-2.5 (3) MG/3ML SOLN    levETIRAcetam (KEPPRA) 500 MG tablet    lidocaine (LIDODERM) 5 %    loperamide (IMODIUM) 2 MG capsule    Maltodextrin-Xanthan Gum (RESOURCE THICKENUP CLEAR) POWD    Multiple Vitamin (MULTIVITAMIN WITH MINERALS) TABS tablet    nicotine (NICODERM CQ - DOSED IN MG/24 HOURS) 21 mg/24hr patch    nutrition supplement, JUVEN, (JUVEN) PACK    nystatin (MYCOSTATIN) 100000 UNIT/ML suspension    omeprazole (PRILOSEC) 20 MG capsule    oxybutynin (DITROPAN) 5 MG tablet    Potassium Chloride ER 20 MEQ TBCR    predniSONE (DELTASONE) 10 MG tablet    risperiDONE (RISPERDAL) 1 MG tablet    senna-docusate (SENOKOT-S) 8.6-50 MG tablet    carbidopa-levodopa (SINEMET IR) 25-100 MG tablet    Lidocaine 4 % PTCH    loperamide  (IMODIUM A-D) 2 MG tablet    Nutritional Supplements (RESOURCE 2.0) LIQD    polyethylene glycol (MIRALAX / GLYCOLAX) packet    predniSONE (DELTASONE) 2.5 MG tablet    propranolol (INDERAL) 10 MG tablet    umeclidinium bromide (INCRUSE ELLIPTA) 62.5 MCG/INH AEPB       Allergies  Allergen Reactions  . Immune Globulins Other (See Comments)    Acute renal failure  . Rho (D) Immune Globulin     Acute renal failure Acute renal failure  . Ciprofloxacin Swelling  . Flagyl [Metronidazole] Swelling  . Sulfa Antibiotics Other (See Comments)    blisters  . Sulfasalazine     blisters blisters  . Vancomycin     Diffuse maculopapular rash when receiving both vanc/cefepime.  Timing unclear.    . Bupropion     Unknown  . Bupropion Hcl Other (See Comments)    Unknown  . Cefepime Rash    Diffuse maculopapular rash when receiving both vanc and cefepime, timing unclear.  Challenged with Cefepime again 01/2018 and developed full body rash.  Tolerated ceftriaxone and ceftazidime 01/2018.  . Clobetasol Other (See Comments)    Unknown Unknown  . Escitalopram Oxalate     Unknown  . Fluoxetine Hcl     Unknown  . Furosemide Other (See Comments)    Unknown Unknown  . Meclizine Rash  . Paroxetine Other (See Comments)    Unknown  . Penicillins Other (See Comments)    Unknown reaction - Tolerated ceftriaxone 01/2018 Has patient had a PCN reaction causing immediate rash, facial/tongue/throat swelling, SOB or lightheadedness with hypotension: unknown Has patient had a PCN reaction causing severe rash involving mucus membranes or skin necrosis: unknown Has patient had a PCN reaction that required hospitalization unknown Has patient had a PCN reaction occurring within the last 10 years: unknown If all of the above answers are "NO", then may proceed with Cephalospori  . Tacrolimus Other (See Comments)    Unknown  . Tetanus Toxoid Other (See Comments)    Unknown  . Tuberculin Purified Protein  Derivative Other (See Comments)    Unknown    Social History   Occupational History  . Occupation: disabled  Tobacco Use  . Smoking status: Former Smoker    Packs/day: 0.50    Years: 30.00    Pack years: 15.00    Types: Cigarettes    Last attempt to quit: 10/2017    Years since quitting: 0.3  . Smokeless tobacco: Never Used  Substance and Sexual Activity  . Alcohol  use: No    Alcohol/week: 0.0 standard drinks    Comment: Alcohol stopped in September of 2014  . Drug use: No  . Sexual activity: Never    Family History  Problem Relation Age of Onset  . Alcohol abuse Mother   . Depression Mother   . Heart disease Mother   . Diabetes Mother   . Stroke Mother   . Coronary artery disease Father   . Diabetes Other        1/2 brother  . Hepatitis Brother     Immunization History  Administered Date(s) Administered  . H1N1 02/19/2008  . Influenza Split 11/03/2010  . Influenza Whole 02/19/2008, 03/03/2009, 12/11/2009  . Influenza, High Dose Seasonal PF 02/15/2018  . Influenza,inj,Quad PF,6+ Mos 11/13/2012, 12/07/2013, 12/04/2014  . Pneumococcal Conjugate-13 12/03/2014  . Pneumococcal Polysaccharide-23 02/12/2009, 11/13/2012  . Rabies, IM 05/13/2012, 05/19/2012    Review of systems: Positive Findings in bold print.  Constitutional:  chills, fatigue, fever, sweats, weight change Communication: Optometrist, sign Ecologist, hand writing, iPad/Android device Eyes: diplopia, glare,  light sensitivity, eyeglasses, blindness Ears nose mouth throat: Hard of hearing, deaf, sign language,  vertigo,   bloody nose,  rhinitis,  cold sores, snoring Cardiovascular: HTN, edema, arrhythmia, pacemaker in place, defibrillator in place,  chest pain/tightness, chronic anticoagulation, blood clot, MI Respiratory:  Asthma, difficulty breathing, denies congestion, SOB, wheezing, cough Gastrointestinal: abdominal pain, diarrhea, nausea, vomiting, reflux Genitourinary:  nocturia,  pain  on urination,  blood in urine, Foley catheter, urinary urgency Musculoskeletal: Uses mobility aid, cramping, stiff joints, painful joints Skin: +changes in toenails, color change dryness, itchy skin, mole changes,  rash  Neurological: numbness, paresthesias, burning in feet, denies fainting,  seizure, change in speech. denies headaches, memory problems/poor historian, cerebral palsy, neuropathy, CVA Endocrine: diabetes, hypothyroidism, hyperthyroidism,  dry mouth, flushing,  heat intolerance,  cold intolerance,  excessive thirst, denies polyuria, nocturia Hematological:  easy bleeding,  excessive bleeding, easy bruising, enlarged lymph nodes, on long term blood thinner Allergy/immunological:  hives, frequent infections, multiple drug allergies, seasonal allergies,  Psychiatric:  anxiety, depression, mood disorder, suicidal ideations, hallucinations   Objective: Vascular Examination: Capillary refill time <3 seconds x 10 digits Dorsalis pedis palpable b/l Posterior tibial pulses absent b/l No digital hair x 10 digits Skin temperature gradient WNL b/l Edema b/l LE   Dermatological Examination: Age related skin atrophy b/l  Venous stasis changes b/l  Toenails 1-5 b/l discolored, thick, dystrophic with subungual debris and pain with palpation to nailbeds due to thickness of nails.  Musculoskeletal: LE muscle atrophy b/l  Neurological: Sensation intact with 10 gram monofilament  Assessment: 1. Painful onychomycosis toenails 1-5 b/l  2. NIDDM   Plan: 1. Discussed onychomycosis and treatment options.  Literature dispensed on today. 2. Toenails 1-5 b/l were debrided in length and girth without iatrogenic bleeding. 3. Patient to continue soft, supportive shoe gear 4. Patient to report any pedal injuries to medical professional immediately. 5. Follow up 3 months. Patient/POA to call should there be a concern in the interim.

## 2018-02-15 DEATH — deceased

## 2018-02-16 LAB — BASIC METABOLIC PANEL
Anion gap: 10 (ref 5–15)
BUN: 11 mg/dL (ref 8–23)
CO2: 37 mmol/L — ABNORMAL HIGH (ref 22–32)
Calcium: 8 mg/dL — ABNORMAL LOW (ref 8.9–10.3)
Chloride: 96 mmol/L — ABNORMAL LOW (ref 98–111)
Creatinine, Ser: 0.53 mg/dL — ABNORMAL LOW (ref 0.61–1.24)
GFR calc Af Amer: >60 mL/min (ref >60)
GFR calc non Af Amer: >60 mL/min (ref >60)
Glucose, Bld: 174 mg/dL — ABNORMAL HIGH (ref 70–99)
Potassium: 2.6 mmol/L — CL (ref 3.5–5.1)
Sodium: 143 mmol/L (ref 135–145)

## 2018-02-16 LAB — PHOSPHORUS
Phosphorus: <1 mg/dL — CL (ref 2.5–4.6)
Phosphorus: 2.5 mg/dL (ref 2.5–4.6)

## 2018-02-16 LAB — GLUCOSE, CAPILLARY
GLUCOSE-CAPILLARY: 116 mg/dL — AB (ref 70–99)
Glucose-Capillary: 99 mg/dL (ref 70–99)
Glucose-Capillary: 127 mg/dL — ABNORMAL HIGH (ref 70–99)
Glucose-Capillary: 144 mg/dL — ABNORMAL HIGH (ref 70–99)
Glucose-Capillary: 140 mg/dL — ABNORMAL HIGH (ref 70–99)

## 2018-02-16 LAB — MAGNESIUM: Magnesium: 1.5 mg/dL — ABNORMAL LOW (ref 1.7–2.4)

## 2018-02-16 MED ORDER — POTASSIUM CHLORIDE 20 MEQ/15ML (10%) PO SOLN
40.0000 meq | Freq: Once | ORAL | Status: AC
  Administered 2018-02-16: 40 meq
  Filled 2018-02-16: qty 30

## 2018-02-16 MED ORDER — BUMETANIDE 1 MG PO TABS
1.0000 mg | ORAL_TABLET | Freq: Two times a day (BID) | ORAL | Status: AC
  Administered 2018-02-16 (×2): 1 mg
  Filled 2018-02-16 (×2): qty 1

## 2018-02-16 MED ORDER — POTASSIUM CHLORIDE 10 MEQ/100ML IV SOLN
10.0000 meq | INTRAVENOUS | Status: DC
  Filled 2018-02-16: qty 100

## 2018-02-16 MED ORDER — CLONAZEPAM 0.5 MG PO TABS: 0.2500 mg | ORAL_TABLET | Freq: Two times a day (BID) | ORAL | Status: DC

## 2018-02-16 MED ORDER — CLONAZEPAM 0.25 MG PO TBDP
0.2500 mg | ORAL_TABLET | Freq: Two times a day (BID) | ORAL | Status: AC
  Administered 2018-02-19: 0.25 mg via ORAL
  Filled 2018-02-16: qty 1

## 2018-02-16 MED ORDER — CLONAZEPAM 0.5 MG PO TABS: 0.5000 mg | ORAL_TABLET | Freq: Two times a day (BID) | ORAL | Status: DC

## 2018-02-16 MED ORDER — SODIUM PHOSPHATES 45 MMOLE/15ML IV SOLN
30.0000 mmol | Freq: Once | INTRAVENOUS | Status: AC
  Administered 2018-02-16: 30 mmol via INTRAVENOUS
  Filled 2018-02-16: qty 10

## 2018-02-16 MED ORDER — BUMETANIDE 1 MG PO TABS
1.0000 mg | ORAL_TABLET | Freq: Two times a day (BID) | ORAL | Status: DC
  Filled 2018-02-16: qty 1

## 2018-02-16 MED ORDER — POTASSIUM CHLORIDE 10 MEQ/100ML IV SOLN
10.0000 meq | INTRAVENOUS | Status: AC
  Administered 2018-02-16 (×6): 10 meq via INTRAVENOUS
  Filled 2018-02-16 (×6): qty 100

## 2018-02-16 MED ORDER — POTASSIUM CHLORIDE 10 MEQ/100ML IV SOLN
10.0000 meq | INTRAVENOUS | Status: DC
  Administered 2018-02-16: 10 meq via INTRAVENOUS

## 2018-02-16 MED ORDER — CLONAZEPAM 0.5 MG PO TBDP
0.5000 mg | ORAL_TABLET | Freq: Two times a day (BID) | ORAL | Status: AC
  Administered 2018-02-16 – 2018-02-17 (×2): 0.5 mg
  Filled 2018-02-16 (×2): qty 1

## 2018-02-16 NOTE — Progress Notes (Signed)
Attending:    Subjective: Off antibiotics Off levophed Remains mechanically ventilated  Objective: Vitals:   02/16/18 0800 02/16/18 0809 02/16/18 0811 02/16/18 0900  BP: 93/65 93/65  107/73  Pulse: (!) 118 (!) 115  (!) 58  Resp: 17 20  18   Temp:      TempSrc:      SpO2: 93% 93% 93% 94%  Weight:      Height:       Vent Mode: PRVC FiO2 (%):  [40 %] 40 % Set Rate:  [16 bmp] 16 bmp Vt Set:  [560 mL] 560 mL PEEP:  [5 cmH20] 5 cmH20 Plateau Pressure:  [12 cmH20-18 cmH20] 17 cmH20  Intake/Output Summary (Last 24 hours) at 02/16/2018 1040 Last data filed at 02/16/2018 0930 Gross per 24 hour  Intake 1950.54 ml  Output 3780 ml  Net -1829.46 ml    General:  In bed on vent HENT: NCAT ETT in place PULM: Rhonchi bilaterally, vent supported breathing CV: RRR, no mgr GI: BS+, soft, nontender Derm: anasarca, edema noted Neuro: sedated on vent   CBC    Component Value Date/Time   WBC 9.6 02/15/2018 0253   RBC 4.45 02/15/2018 0253   HGB 13.2 02/15/2018 0253   HCT 41.8 02/15/2018 0253   PLT 165 02/15/2018 0253   MCV 93.9 02/15/2018 0253   MCH 29.7 02/15/2018 0253   MCHC 31.6 02/15/2018 0253   RDW 17.6 (H) 02/15/2018 0253   LYMPHSABS 2.2 02/15/2018 0253   MONOABS 0.6 02/15/2018 0253   EOSABS 0.0 02/15/2018 0253   BASOSABS 0.0 02/15/2018 0253    BMET    Component Value Date/Time   NA 143 02/15/2018 0253   K 3.7 02/15/2018 0253   CL 103 02/15/2018 0253   CO2 30 02/15/2018 0253   GLUCOSE 121 (H) 02/15/2018 0253   BUN 23 02/15/2018 0253   CREATININE 0.60 (L) 02/15/2018 0253   CREATININE 0.97 05/19/2015 1444   CALCIUM 8.3 (L) 02/15/2018 0253   GFRNONAA >60 02/15/2018 0253   GFRNONAA 85 03/26/2013 1108   GFRAA >60 02/15/2018 0253   GFRAA >89 03/26/2013 1108     Impression/Plan: Septic shock/pneumonia: resolved, off antibiotics Anasarca: continue bumex 1mg  bid again today Acute respiratory failure with hypoxemia: wean to pressure support today  Goals of care:  need to find family, if social work cannot reach them then will proceed with one way extubation per his prior stated wishes to not be maintained on prolonged life support  My cc time 32 minutes  Roselie Awkward, MD [facility] Pager: [phone] Cell: [phone] After 3pm or if no response, call [phone]

## 2018-02-16 NOTE — Progress Notes (Signed)
CRITICAL VALUE ALERT  Critical Value:  Phosphorus <1.0  Date & Time Notied:  12/18/18 @ 8845  Provider Notified: Warren Lacy  Orders Received/Actions taken:

## 2018-02-16 NOTE — Progress Notes (Signed)
eLink Physician-Brief Progress Note Patient Name: Patrick Holt DOB: October 16, 1950 MRN: [id number]   Date of Service  02/16/2018  HPI/Events of Note  Hypophosphatemia - Phosphorus level < 1.0   eICU Interventions  Will order: 1. Replace PO4---. 2. Repeat PO4--- level at 2 PM.     Intervention Category Major Interventions: Electrolyte abnormality - evaluation and management  Asia Favata,Tyresse Eugene 02/16/2018, 5:58 AM

## 2018-02-16 NOTE — Progress Notes (Signed)
PT Cancellation Note  Patient Details Name: Patrick Holt MRN: [id number] DOB: 04/18/1950   Cancelled Treatment:     discussed with RN. Team tentatively   planning for 1 way extubation, pt currently lethargic and hypotensive, will cont to follow.    Reinaldo Berber, PT, DPT Acute Rehabilitation Services Pager: [phone] Office: [phone]     Reinaldo Berber 02/16/2018, 4:29 PM

## 2018-02-16 NOTE — Progress Notes (Signed)
NAME:  Patrick Holt, MRN:  [id number], DOB:  [birth year], LOS: 7 ADMISSION DATE:  01/29/2018, CONSULTATION DATE:  02/09/2018 REFERRING MD:  [name] Carnes, PA, CHIEF COMPLAINT:  Altered mental status  Brief History   68 year old man presented from a SNF with AMS found to be in septic shock secondary to pneumococcal pneumonia. Placed on BiPAP and Levophed and admit to the ICU.   Past Medical History  Asthma, COPD, afib (on eliquis),T2DM, HTN, HLD, Dermatomyositis with esophogeal stricture, RA, sarcoidosis, CVA, subarachnoid hemorrhage, Diverticulosis, hiatal hernia,   Depression, Anxiety, Bipolar 1  Significant Hospital Events   12/26 Admitted, Weaned off pressors 12/27 Transfer from ICU 12/30 Rash  Consults:  Neurology signed off 12/28  Procedures:  12/28 ETT >   Significant Diagnostic Tests:  12/27 MRI brain > advanced chronic ischemic microangiopathy, without intracranial abnormality  Micro Data:  12/25 BC >> No growth 12/25 UC >> No growth 12/25 MRSA PCR >> neg 12/25 Flu neg 12/26 Urine pneumococcus- positve 12/28 BAL > enterobacter  Antimicrobials:  12/25 vancomycin (allergy reviewed, 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 rate 20, height _0  (1.753 m), weight 87.4 kg, SpO2 93 %.    Vent Mode: PRVC FiO2 (%):  [40 %] 40 % Set Rate:  [16 bmp] 16 bmp Vt Set:  [560 mL] 560 mL PEEP:  [5 cmH20] 5 cmH20 Plateau Pressure:  [12 cmH20-18 cmH20] 17 cmH20   Intake/Output Summary (Last 24 hours) at 02/16/2018 4098 Last data filed at 02/16/2018 0800 Gross per 24 hour  Intake 2097.71 ml  Output 3555 ml  Net -1457.29 ml   Filed Weights    02/14/18 0500 02/15/18 0345 02/16/18 0357  Weight: 86.2 kg 88.7 kg 87.4 kg    Examination: General: ill appearing  HENT: intubated  Lungs: wheezing, no rhonchi  Cardiovascular: regular rate and rhythm, no murmur  Abdomen: soft, non tender, non distended  Extremities: inflated boots on both lower extremities  Neuro: opens eyes to voice   Resolved Hospital Problem list   AKI QTc prolongation Furosemide related rash   Assessment & Plan:   Septic shock, sedation related hypotension  - weaned off of levophed yesterday, maintained good blood pressure   Acute on chronic hypoxic respiratory failure secondary to pneumococcal pneumonia  Mucus plug  Acute pulmonary edema and anasarca  Hx of COPD, tobacco abuse, sarcoidosis, ? Pulmonary hypertension  - continue full vent support  - continue diuresis with bumetanide 1 mg BID  - daily WUA/SBT  - may be ready to extubate, need family input on whether this will be a one way extubation   Hypophosphatemia  - replaced  - follow up phos level this afternoon    ICU delirium  Hx of seizures, bipolar, conversion disorder  - continue valproate, keppra, risperdone, sinemet  -begin to taper clonazepam   Afib  - rate controlled off of amiodarone and beta blocker  - continue eliquis for stroke ppx  - continue tele monitoring   Hx of RA/ sarcoidosis  - continue home prednisone 2.5 mg qd   Hyperglycemia  - continue to monitor   Hyperlipidemia  - continue home statin   Protein calorie  malnutrition  - continue tube feeds   Best practice:  Diet: tube feeds  Pain/Anxiety/Delirium protocol (if indicated): klonopin  VAP protocol (if indicated): yes  DVT prophylaxis: eliquis  GI prophylaxis: famotidine via G tube  Glucose control: monitoring CBG  Code Status: FULL  Family Communication: CSW has attempted to contact the patients brother, sister and brother in law through six different phone numbers but did not have success.     Disposition:   Labs   CBC: Recent Labs  Lab 02/11/18 0240 02/12/18 0408 02/14/18 0433 02/15/18 0253  WBC 8.2 6.9 8.1 9.6  NEUTROABS  --   --  4.4 6.8  HGB 13.0 13.2 12.6* 13.2  HCT 40.9 39.4 39.2 41.8  MCV 94.7 90.0 92.9 93.9  PLT 179 149* 165 528    Basic Metabolic Panel: Recent Labs  Lab 02/11/18 0240 02/12/18 0408 02/14/18 0433 02/15/18 0253 02/16/18 0359  NA 147* 147* 145 143  --   K 3.7 4.6 3.2* 3.7  --   CL 109 112* 107 103  --   CO2 27 21* 28 30  --   GLUCOSE 106* 73 128* 121*  --   BUN _0 --   CREATININE 0.68 0.75 0.61 0.60*  --   CALCIUM 8.9 9.1 8.3* 8.3*  --   MG 2.0 1.8  --   --   --   PHOS 1.6* 1.4*  --   --  <1.0*   GFR: Estimated Creatinine Clearance: 96.8 mL/min (A) (by C-G formula based on SCr of 0.6 mg/dL (L)). Recent Labs  Lab 02/11/18 0240 02/12/18 0408 02/14/18 0433 02/15/18 0253  WBC 8.2 6.9 8.1 9.6    Liver Function Tests: No results for input(s): AST, ALT, ALKPHOS, BILITOT, PROT, ALBUMIN in the last 168 hours. No results for input(s): LIPASE, AMYLASE in the last 168 hours. No results for input(s): AMMONIA in the last 168 hours.  ABG    Component Value Date/Time   PHART 7.596 (H) 02/12/2018 0249   PCO2ART 27.7 (L) 02/12/2018 0249   PO2ART 128.0 (H) 02/12/2018 0249   HCO3 27.1 02/12/2018 0249   TCO2 28 02/12/2018 0249   ACIDBASEDEF 3.0 (H) 02/09/2018 0757   O2SAT 99.0 02/12/2018 0249     Coagulation Profile: No results for input(s): INR, PROTIME in the last 168 hours.  Cardiac Enzymes: No results for input(s): CKTOTAL, CKMB, CKMBINDEX, TROPONINI in the last 168 hours.  HbA1C: Hgb A1c MFr Bld  Date/Time Value Ref Range Status  11/01/2017 02:47 PM 5.4 4.8 - 5.6 % Final    Comment:    (NOTE)         Prediabetes: 5.7 - 6.4         Diabetes: >6.4         Glycemic control for adults with diabetes: <7.0   01/14/2017 06:14 AM 5.9 (H) 4.8 - 5.6 % Final    Comment:    (NOTE) Pre diabetes:           5.7%-6.4% Diabetes:              >6.4% Glycemic control for   <7.0% adults with diabetes     CBG: Recent Labs  Lab 02/15/18 1514 02/15/18 1920 02/15/18 2314 02/16/18 0311 02/16/18 0726  GLUCAP 157* 151* 128* 116* 99    Review of Systems:   Patient intubated, unable to answer questions   Past Medical History  He,  has a past medical history of Anemia, Anxiety, Asthma, Atrial fibrillation (  [place]) (01/2012), Bipolar 1 disorder ([place]), Cancer ([place]), Collagen vascular disease ([place]), Conversion disorder (06/2010), COPD (chronic obstructive pulmonary disease) ([place]), CVA (cerebral vascular accident) ([place]), Dermatomyositis ([place]), Dermatomyositis ([place]), Diverticulitis, Diverticulosis of colon, Dysrhythmia, Esophageal dysmotility, Esophageal stricture, Fibromyalgia, Gastritis, GERD (gastroesophageal reflux disease), Hand fracture, right, Headache(784.0), Hiatal hernia, History of narcotic addiction ([place]), adenomatous colonic polyps, Hyperlipidemia, Hypertension, Internal hemorrhoids, Ischemic heart disease, Major depression, Myocardial infarct ([place]), Narcotic dependence ([place]), Nephrolithiasis, Obesity, OCD (obsessive compulsive disorder), Otosclerosis, Paroxysmal A-fib ([place]), Peripheral neuropathy, Raynaud's disease, Renal insufficiency, Rheumatoid arthritis(714.0), Sarcoidosis, Seizures ([place]), Subarachnoid hemorrhage ([place]) (01/2012), Tremor, Type II diabetes mellitus ([place]), Urge incontinence, and Vertigo.   Surgical History    Past Surgical History:  Procedure Laterality Date  . BACK SURGERY    . CARDIAC CATHETERIZATION    . CARPAL TUNNEL RELEASE Bilateral   . CATARACT EXTRACTION W/ INTRAOCULAR LENS IMPLANT Left   . COLONOSCOPY N/A 09/27/2012   Procedure: COLONOSCOPY;  Surgeon: Lafayette Dragon, MD;  Location: [facility];  Service: Endoscopy;  Laterality: N/A;  . ESOPHAGOGASTRODUODENOSCOPY N/A 09/27/2012   Procedure: ESOPHAGOGASTRODUODENOSCOPY (EGD);  Surgeon: Lafayette Dragon, MD;  Location: [facility];  Service: Endoscopy;  Laterality: N/A;  . KNEE ARTHROSCOPY W/ MENISCAL REPAIR Left 2009  . LUMBAR DISC SURGERY    . LYMPH NODE DISSECTION Right 1970's   "neck; dr thought I had Hodgkins; turned out to be sarcoidosis" (11/15/2012)  . squamous papilloma   2010   removed by Dr. Constance Holster ENT, noted on tongue  . TONSILLECTOMY       Social History   reports that he quit smoking about 4 months ago. His smoking use included cigarettes. He has a 15.00 pack-year smoking history. He has never used smokeless tobacco. He reports that he does not drink alcohol or use drugs.   Family History   His family history includes Alcohol abuse in his mother; Coronary artery disease in his father; Depression in his mother; Diabetes in his mother and another family member; Heart disease in his mother; Hepatitis in his brother; Stroke in his mother.   Allergies Allergies  Allergen Reactions  . Immune Globulins Other (See Comments)    Acute renal failure  . Rho (D) Immune Globulin     Acute renal failure Acute renal failure  . Ciprofloxacin Swelling  . Flagyl [Metronidazole] Swelling  . Sulfa Antibiotics Other (See Comments)    blisters  . Sulfasalazine     blisters blisters  . Vancomycin     Diffuse maculopapular rash when receiving both vanc/cefepime.  Timing unclear.    . Bupropion     Unknown  . Bupropion Hcl Other (See Comments)    Unknown  . Cefepime Rash    Diffuse maculopapular rash when receiving both vanc and cefepime, timing unclear.  Challenged with Cefepime again 01/2018 and developed full body rash.  Tolerated ceftriaxone and ceftazidime 01/2018.  . Clobetasol Other (See Comments)    Unknown Unknown  . Escitalopram Oxalate     Unknown  . Fluoxetine Hcl     Unknown  . Furosemide Other (See Comments)    Unknown Unknown  . Meclizine Rash  . Paroxetine Other (See Comments)    Unknown  . Penicillins Other (See Comments)    Unknown reaction - Tolerated ceftriaxone 01/2018 Has  patient had a PCN reaction causing immediate rash, facial/tongue/throat swelling, SOB or lightheadedness with hypotension: unknown Has patient had a PCN reaction causing severe rash involving mucus membranes or skin necrosis: unknown Has patient had a  PCN reaction that required hospitalization unknown Has patient had a PCN reaction occurring within the last 10 years: unknown If all of the above answers are "NO", then may proceed with Cephalospori  . Tacrolimus Other (See Comments)    Unknown  . Tetanus Toxoid Other (See Comments)    Unknown  . Tuberculin Purified Protein Derivative Other (See Comments)    Unknown     Home Medications  Prior to Admission medications   Medication Sig Start Date End Date Taking? Authorizing Provider  acetaminophen (TYLENOL) 325 MG tablet Take 2 tablets (650 mg total) by mouth every 6 (six) hours as needed for mild pain (or Fever >/= 101). 11/04/17  Yes Debbe Odea, MD  ALPRAZolam Duanne Moron) 0.5 MG tablet Take 1 tablet (0.5 mg total) by mouth 3 (three) times daily. Patient taking differently: Take 0.5 mg by mouth 2 (two) times daily.  10/14/17  Yes Elgergawy, Silver Huguenin, MD  amiodarone (PACERONE) 400 MG tablet Take 1 tablet (400 mg total) by mouth daily. 11/11/17  Yes Sheikh, Omair Latif, DO  apixaban (ELIQUIS) 5 MG TABS tablet Take 10 mg oral twice daily for total of  days, then transition to 5 mg oral twice daily on 10/21/2017 Patient taking differently: Take 5 mg by mouth 2 (two) times daily.  10/18/17  Yes Elgergawy, Silver Huguenin, MD  atorvastatin (LIPITOR) 40 MG tablet Take 1 tablet (40 mg total) by mouth daily. 09/07/17  Yes Oretha Milch D, MD  benzonatate (TESSALON) 100 MG capsule Take 1 capsule (100 mg total) by mouth 3 (three) times daily. 11/10/17  Yes Sheikh, Omair Latif, DO  carbidopa-levodopa (SINEMET IR) 25-100 MG tablet Take 1 tablet by mouth 2 (two) times daily.   Yes [provider]  divalproex (DEPAKOTE) 250 MG DR tablet Take 1 tablet (250 mg total)  by mouth 3 (three) times daily. 09/07/17  Yes Oretha Milch D, MD  ergocalciferol (VITAMIN D2) 50000 units capsule Take 1 capsule (50,000 Units total) by mouth once a week. 09/07/17  Yes Oretha Milch D, MD  guaiFENesin (MUCINEX) 600 MG 12 hr tablet Take 2 tablets (1,200 mg total) by mouth 2 (two) times daily. 10/18/17  Yes Elgergawy, Silver Huguenin, MD  ipratropium-albuterol (DUONEB) 0.5-2.5 (3) MG/3ML SOLN Inhale 3 mLs into the lungs every 6 (six) hours as needed. Patient taking differently: Inhale 3 mLs into the lungs every 6 (six) hours as needed (wheezing).  09/07/17  Yes Oretha Milch D, MD  levETIRAcetam (KEPPRA) 500 MG tablet Take 1 tablet (500 mg total) by mouth 2 (two) times daily. 09/07/17  Yes Oretha Milch D, MD  Lidocaine 4 % PTCH Apply 1 patch topically daily. To each shoulder.  Remove after 12 hours   Yes [provider]  loperamide (IMODIUM A-D) 2 MG tablet Take 2 mg by mouth as needed for diarrhea or loose stools.   Yes [provider]  Multiple Vitamin (MULTIVITAMIN WITH MINERALS) TABS tablet Take 1 tablet by mouth daily. 09/07/17  Yes Oretha Milch D, MD  Nutritional Supplements (RESOURCE 2.0) LIQD Take 90 mLs by mouth 3 (three) times daily between meals.   Yes [provider]  nystatin (MYCOSTATIN) 100000 UNIT/ML suspension Take 5 mLs (500,000 Units total) by mouth 4 (four) times daily. Patient taking differently: Take 5 mLs by mouth 2 (two) times daily.  11/10/17  Yes Sheikh, Omair Latif, DO  omeprazole (PRILOSEC) 20 MG capsule Take 20 mg by mouth daily.   Yes [provider]  oxybutynin (DITROPAN) 5 MG tablet  Take 1 tablet (5 mg total) by mouth 3 (three) times daily. 09/07/17  Yes Oretha Milch D, MD  polyethylene glycol (MIRALAX / GLYCOLAX) packet Take 17 g by mouth daily.   Yes [provider]  Potassium Chloride ER 20 MEQ TBCR Take 20 mEq by mouth every other day. 09/20/17  Yes Patrecia Pour, MD  predniSONE (DELTASONE) 2.5 MG tablet Take 2.5  mg by mouth daily with breakfast.   Yes [provider]  propranolol (INDERAL) 10 MG tablet Take 10 mg by mouth 2 (two) times daily.   Yes [provider]  risperiDONE (RISPERDAL) 1 MG tablet Take 1 tablet (1 mg total) by mouth 2 (two) times daily. 09/07/17  Yes Oretha Milch D, MD  senna-docusate (SENOKOT-S) 8.6-50 MG tablet Take 1 tablet by mouth at bedtime as needed for mild constipation. Patient taking differently: Take 1 tablet by mouth at bedtime.  09/07/17  Yes Oretha Milch D, MD  umeclidinium bromide (INCRUSE ELLIPTA) 62.5 MCG/INH AEPB Inhale 1 puff into the lungs daily.   Yes [provider]  albuterol (PROVENTIL) (2.5 MG/3ML) 0.083% nebulizer solution Take 3 mLs (2.5 mg total) by nebulization every 4 (four) hours as needed for wheezing or shortness of breath. Patient not taking: Reported on 02/09/2018 11/10/17   Raiford Noble Latif, DO  budesonide-formoterol [facility]) 160-4.5 MCG/ACT inhaler Inhale 2 puffs into the lungs 2 (two) times daily. Patient not taking: Reported on 02/09/2018 09/07/17   Oretha Milch D, MD  esomeprazole (NEXIUM) 40 MG capsule Take 1 capsule (40 mg total) by mouth daily. Patient not taking: Reported on 02/09/2018 09/07/17   Oretha Milch D, MD  fluconazole (DIFLUCAN) 100 MG tablet Take 1 tablet (100 mg total) by mouth daily. Patient not taking: Reported on 02/09/2018 11/11/17   Raiford Noble Latif, DO  lidocaine (LIDODERM) 5 % Place 1 patch onto the skin daily. Remove & Discard patch within 12 hours or as directed by MD Patient not taking: Reported on 02/09/2018 11/11/17   Raiford Noble Latif, DO  loperamide (IMODIUM) 2 MG capsule Take 1 capsule (2 mg total) by mouth as needed for diarrhea or loose stools. Patient not taking: Reported on 02/09/2018 09/07/17   Desiree Hane, MD  Maltodextrin-Xanthan Gum (RESOURCE THICKENUP CLEAR) POWD Take 120 g by mouth as needed. Patient not taking: Reported on 02/09/2018 11/10/17   Raiford Noble  Latif, DO  nicotine (NICODERM CQ - DOSED IN MG/24 HOURS) 21 mg/24hr patch Place 1 patch (21 mg total) onto the skin daily. Patient not taking: Reported on 02/09/2018 10/15/17   Elgergawy, Silver Huguenin, MD  nutrition supplement, JUVEN, (JUVEN) PACK Take 1 packet by mouth 2 (two) times daily between meals. Patient not taking: Reported on 02/09/2018 11/04/17   Debbe Odea, MD  predniSONE (DELTASONE) 10 MG tablet Take 0.5 tablets (5 mg total) by mouth daily with breakfast. Patient not taking: Reported on 02/09/2018 10/18/17   Elgergawy, Silver Huguenin, MD     Critical care time: **

## 2018-02-16 NOTE — Clinical Social Work Note (Signed)
Clinical Social Work Assessment  Patient Details  Name: Patrick Holt MRN: [id number] Date of Birth: [birth year]  Date of referral:  02/16/18               Reason for consult:  Other (Comment Required)(locating family )                Permission sought to share information with:  Other(none. ) Permission granted to share information::  No  Name::     [facility] and [name] (friend)   Agency::  SNF, adn friend   Relationship::  SNF and friend of pt.  Contact Information:  SNF and friend   Housing/Transportation Living arrangements for the past 2 months:  Skilled Nursing Facility([facility] ) Source of Information:  Facility, Engineer, materials Patient Interpreter Needed:  None Criminal Activity/Legal Involvement Pertinent to Current Situation/Hospitalization:  No - Comment as needed Significant Relationships:  None Lives with:  Facility Resident Do you feel safe going back to the place where you live?  No Need for family participation in patient care:  Yes (Comment)  Care giving concerns:  CSW consulted as staff is needing help locating pt's next of kin during this time.    Social Worker assessment / plan:  CSW unable to speak with pt as pt is intubated. CSW spoke with RN on unit and was informed that a list of numbers have been provided to staff for possible contacts for pt's family. CSW attempted to reach a sister, brother and brother in law regarding pt-no answer at the numbers. CSW spoke with MD working with pt and informed him that CSW would look into having non emergent [employer] check address given to CSW for pt's family. MD agreeable. CSW spoke with Ivin Booty who is pt's friend and was informed that her daughter and pt were once married and now they are divorced. According to Ivin Booty, she has been helping pt secretly and does not wish to have family notified of this as Ivin Booty expressed "this will tear my family apart". Ivin Booty expressed that pt has no one and is estranged from all  family.   Employment status:  Other (Comment)(unknown) Insurance information:  Managed Medicare PT Recommendations:  Not assessed at this time Information / Referral to community resources:     Patient/Family's Response to care:  Sharon's response to pt' care is involved but limited in decision making abilities as she is not next of kin.   Patient/Family's Understanding of and Emotional Response to Diagnosis, Current Treatment, and Prognosis:  CSW still attempting to locate family.  Emotional Assessment Appearance:  Appears stated age Attitude/Demeanor/Rapport:  Unable to Assess Affect (typically observed):  Unable to Assess Orientation:  (pt intubated. ) Alcohol / Substance use:  Not Applicable Psych involvement (Current and /or in the community):  No (Comment)  Discharge Needs  Concerns to be addressed:  Other (Comment Required(locating family for goals of care.) Readmission within the last 30 days:  No Current discharge risk:  Lack of support system, Dependent with Mobility, Terminally ill Barriers to Discharge:  Continued Medical Work up   [employer], [place] 02/16/2018, 12:21 PM

## 2018-02-16 NOTE — Progress Notes (Addendum)
1:06pm-CSW received all from [facility] and was informed that she was able to make contact with pt's brother Chrissie Noa at number listed on contact sheet in pt's chart. Sheriff expressed that she informed Chrissie Noa that he needed to call the hospital to discuss brother. Sheriff reports that Chrissie Noa expressed that he would however Brandon Melnick was unable to obtain a working number for [place] at this time. CSw has update unit of information and that pt's brother should be calling per sheriff.   12:44pm-CSW spoke with Corporal [name] from the [employer] and was informed that she was heading address given for Chrissie Noa (pt's brother). Corporal [name] to call CSW back.   12:28pm-CSW spoke with MD to give updates. CSW has reached out to non emergent [employer] to give address provided by Ivin Booty to assist in locating family. [employer] to call CSW back.   12:06pm-CSW spoke with Ivin Booty [phone]) [phone] and was informed that she doesn't want anyone to know that she has provided information to the hospital for pt. Ivin Booty expressed that her daughter and pt was once married and now they are divorced and pt has no one to help. Ivin Booty expressed that she has been secretly helping pt and doesn't want any of her family or pt's family to know as "it would tear my family apart". Ivin Booty expressed that she is the only person that has been around for pt and she just wants pt's family to be involved more.   11:56am-CSW spoke with RN on unit and was informed that a list of possible contact information for pt has been placed in pt's chart. CSW suggested that CSW would place information in chart so that MD needing to speak with family can call them to give updates and discuss pt's further wishes in care.   Dontavius Keim -brother [phone] or [phone] Diane Gordy-sister and Edwin Dada -brother in law [phone], [phone], [phone], [phone].   CSW has called all numbers at this time with no answer.  Some numbers are found to be inactive at this time. CSW to continued to assist at bed.      CSW consult to assist with locating family for pt. CSW reviewed chart and the only emergency contact number listed is for [name]. CSW reached out to this number however CSW unable to leave voicemail for call back at this time. CSW found another number  listed in a previous CSW note for a DSS Case Worker [phone]- CSW left voicemail for [employer].  CSW has also reached out to [place] to see if they have any further family contact listed in their record for pt. CSW awaits response from [place] well as call back from other parties at this time. CSW will continue to follow and assist with locating family as well.     [name] Baily Hovanec, MSW, [facility] Clinical Social Worker [phone]

## 2018-02-17 DIAGNOSIS — Z978 Presence of other specified devices: Secondary | ICD-10-CM

## 2018-02-17 LAB — MAGNESIUM: Magnesium: 2 mg/dL (ref 1.7–2.4)

## 2018-02-17 LAB — GLUCOSE, CAPILLARY
Glucose-Capillary: 116 mg/dL — ABNORMAL HIGH (ref 70–99)
Glucose-Capillary: 119 mg/dL — ABNORMAL HIGH (ref 70–99)
Glucose-Capillary: 134 mg/dL — ABNORMAL HIGH (ref 70–99)
Glucose-Capillary: 84 mg/dL (ref 70–99)
Glucose-Capillary: 93 mg/dL (ref 70–99)
Glucose-Capillary: 94 mg/dL (ref 70–99)

## 2018-02-17 LAB — BASIC METABOLIC PANEL
Anion gap: 10 (ref 5–15)
Anion gap: 8 (ref 5–15)
BUN: 11 mg/dL (ref 8–23)
BUN: 9 mg/dL (ref 8–23)
CALCIUM: 8.4 mg/dL — AB (ref 8.9–10.3)
CO2: 35 mmol/L — ABNORMAL HIGH (ref 22–32)
CO2: 35 mmol/L — ABNORMAL HIGH (ref 22–32)
Calcium: 8.3 mg/dL — ABNORMAL LOW (ref 8.9–10.3)
Chloride: 96 mmol/L — ABNORMAL LOW (ref 98–111)
Chloride: 100 mmol/L (ref 98–111)
Creatinine, Ser: 0.52 mg/dL — ABNORMAL LOW (ref 0.61–1.24)
Creatinine, Ser: 0.51 mg/dL — ABNORMAL LOW (ref 0.61–1.24)
GFR calc Af Amer: >60 mL/min (ref >60)
GFR calc non Af Amer: >60 mL/min (ref >60)
GFR calc non Af Amer: >60 mL/min (ref >60)
Glucose, Bld: 107 mg/dL — ABNORMAL HIGH (ref 70–99)
Glucose, Bld: 104 mg/dL — ABNORMAL HIGH (ref 70–99)
Potassium: 3.3 mmol/L — ABNORMAL LOW (ref 3.5–5.1)
Potassium: 3.7 mmol/L (ref 3.5–5.1)
Sodium: 141 mmol/L (ref 135–145)
Sodium: 143 mmol/L (ref 135–145)

## 2018-02-17 LAB — PHOSPHORUS
Phosphorus: 2.1 mg/dL — ABNORMAL LOW (ref 2.5–4.6)
Phosphorus: 3.9 mg/dL (ref 2.5–4.6)

## 2018-02-17 MED ORDER — VALPROIC ACID 250 MG/5ML PO SOLN
250.0000 mg | Freq: Three times a day (TID) | ORAL | Status: DC
  Filled 2018-02-17 (×7): qty 5

## 2018-02-17 MED ORDER — POTASSIUM CHLORIDE 20 MEQ/15ML (10%) PO SOLN: 40.0000 meq | Freq: Once | ORAL | Status: DC

## 2018-02-17 MED ORDER — SODIUM PHOSPHATES 45 MMOLE/15ML IV SOLN
30.0000 mmol | Freq: Once | INTRAVENOUS | Status: AC
  Administered 2018-02-17: 30 mmol via INTRAVENOUS
  Filled 2018-02-17: qty 10

## 2018-02-17 MED ORDER — POTASSIUM CHLORIDE 10 MEQ/100ML IV SOLN
10.0000 meq | INTRAVENOUS | Status: DC
  Filled 2018-02-17 (×2): qty 100

## 2018-02-17 MED ORDER — POTASSIUM CHLORIDE 20 MEQ/15ML (10%) PO SOLN
20.0000 meq | Freq: Once | ORAL | Status: AC
  Administered 2018-02-17: 20 meq via ORAL
  Filled 2018-02-17: qty 15

## 2018-02-17 MED ORDER — POTASSIUM CHLORIDE 20 MEQ/15ML (10%) PO SOLN
20.0000 meq | ORAL | Status: AC
  Administered 2018-02-17: 20 meq
  Filled 2018-02-17: qty 15

## 2018-02-17 MED ORDER — BUMETANIDE 1 MG PO TABS
1.0000 mg | ORAL_TABLET | Freq: Two times a day (BID) | ORAL | Status: DC
  Administered 2018-02-17: 1 mg via ORAL
  Filled 2018-02-17 (×3): qty 1

## 2018-02-17 MED ORDER — FAMOTIDINE 40 MG/5ML PO SUSR
20.0000 mg | Freq: Two times a day (BID) | ORAL | Status: DC
  Administered 2018-02-17: 20 mg
  Filled 2018-02-17 (×5): qty 2.5

## 2018-02-17 MED ORDER — POTASSIUM CHLORIDE 20 MEQ/15ML (10%) PO SOLN
40.0000 meq | Freq: Once | ORAL | Status: AC
  Administered 2018-02-17: 40 meq
  Filled 2018-02-17: qty 30

## 2018-02-17 MED ORDER — MAGNESIUM SULFATE 2 GM/50ML IV SOLN
2.0000 g | Freq: Once | INTRAVENOUS | Status: AC
  Administered 2018-02-17: 2 g via INTRAVENOUS
  Filled 2018-02-17: qty 50

## 2018-02-17 NOTE — Progress Notes (Signed)
NAME:  Patrick Holt, MRN:  [id number], DOB:  December 23, 1950, LOS: 8 ADMISSION DATE:  02/13/2018, CONSULTATION DATE:  02/09/2018 REFERRING MD:  [name] Carnes, PA, CHIEF COMPLAINT:  Altered mental status  Brief History   68 year old man presented from a SNF with AMS found to be in septic shock secondary to pneumococcal pneumonia. Placed on BiPAP and Levophed and admit to the ICU.   Past Medical History  Asthma, COPD, afib (on eliquis),T2DM, HTN, HLD, Dermatomyositis with esophogeal stricture, RA, sarcoidosis, CVA, subarachnoid hemorrhage, Diverticulosis, hiatal hernia,   Depression, Anxiety, Bipolar 1  Significant Hospital Events   12/26 Admitted, Weaned off pressors 12/27 Transfer from ICU 12/30 Rash  Consults:  Neurology signed off 12/28  Procedures:  12/28 ETT >  Significant Diagnostic Tests:  12/27 MRI brain > advanced chronic ischemic microangiopathy, without intracranial abnormality  Micro Data:  12/25 BC >> No growth 12/25 UC >> No growth 12/25 MRSA PCR >> neg 12/25 Flu neg 12/26 Urine pneumococcus- positve 12/28 BAL > enterobacter  Antimicrobials:  12/25 vancomycin (allergy reviewed, 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 rate (!) 24, height _0  (1.753 m), weight 85.8 kg, SpO2 97 %.    Vent Mode: PRVC FiO2 (%):  [40 %] 40 % Set Rate:  [16 bmp] 16 bmp Vt Set:  [560 mL] 560 mL PEEP:  [5 cmH20] 5 cmH20 Pressure Support:  [12 cmH20] 12 cmH20 Plateau Pressure:  [11 cmH20-18 cmH20] 12 cmH20   Intake/Output Summary (Last 24 hours) at 02/17/2018 0618 Last data filed at 02/17/2018 0600 Gross per 24 hour  Intake 2580.83 ml  Output 3755 ml  Net -1174.17  ml   Filed Weights   02/15/18 0345 02/16/18 0357 02/17/18 0500  Weight: 88.7 kg 87.4 kg 85.8 kg    Examination: General: ill appearing  HENT: ETT, OG  Lungs: ETT in place, on the vent, lungs clear  Cardiovascular: regular rate and rhythm, no murmur appreciated  Abdomen: quiet bowel sounds, soft, non tender, non distended  Extremities: 1+ pitting edema in either upper extremity, no lower extremity edema, heel pillows in place  Neuro: opens eyes to voice and sustains gaze for <10 seconds  GU: foley catheter in place   Resolved Hospital Problem list   AKI QTc prolongation Furosemide related rash  Septic shock, sedation related hypotension   Assessment & Plan:  68 year old man admit from a nursing facility with pneumococcal pneumonia and septic shock. He has completed a 7 day course of antibiotics and was successfully weaned off of pressors however this is day 7 of mechanical ventilation with an unsuccessful weaning trial yesterday.   Acute on chronic hypoxic respiratory failure secondary to pneumococcal pneumonia  Mucus plug  Acute pulmonary edema and anasarca  Hx of COPD, tobacco abuse, sarcoidosis, ? Pulmonary hypertension  Became apneic during weaning trial yesterday, persistent apnea on vent today.  Extubation would likely be terminal, patient expressed prior wishes of not wanting long term vent support when discussing with a friend. He has estranged sibblings whom we attempted to contact yesterday but were unable to reach.  - continue diuresis with bumetanide 1 mg BID  - continue daily WUA/SBT   Hypophosphatemia  Hypokalemia  Hypomagnesemia  - replacing  ICU delirium  Hx of seizures, bipolar, conversion disorder  - continue valproate, keppra, risperdone, sinemet  -continue to taper clonazepam   Afib  - rate controlled off of amiodarone and beta blocker  - continue eliquis for stroke ppx  - continue tele monitoring   Hx of RA/ sarcoidosis  - continue home  prednisone 2.5 mg qd   Hyperglycemia  - continue to monitor   Hyperlipidemia  - continue home statin   Protein calorie malnutrition  - continue tube feeds   Best practice:  Diet: tube feeds vital AF  Pain/Anxiety/Delirium protocol (if indicated): fentanyl gtt, PRN fentanyl  VAP protocol (if indicated): yes DVT prophylaxis: eliquis  GI prophylaxis: famotidine via G tube  Glucose control: monitoring CBG  Code Status: FULL  Family Communication: CSW attempted to reach family yesterday without success, non at bedside  Disposition: guarded, extubation will probably be terminal   Labs   CBC: Recent Labs  Lab 02/11/18 0240 02/12/18 0408 02/14/18 0433 02/15/18 0253  WBC 8.2 6.9 8.1 9.6  NEUTROABS  --   --  4.4 6.8  HGB 13.0 13.2 12.6* 13.2  HCT 40.9 39.4 39.2 41.8  MCV 94.7 90.0 92.9 93.9  PLT 179 149* 165 939    Basic Metabolic Panel: Recent Labs  Lab 02/11/18 0240 02/12/18 0408 02/14/18 0433 02/15/18 0253 02/16/18 0359 02/16/18 1408 02/16/18 1640 02/17/18 0512  NA 147* 147* 145 143  --  143  --  141  K 3.7 4.6 3.2* 3.7  --  2.6*  --  3.3*  CL 109 112* 107 103  --  96*  --  96*  CO2 27 21* 28 30  --  37*  --  35*  GLUCOSE 106* 73 128* 121*  --  174*  --  107*  BUN _0 --  11  --  11  CREATININE 0.68 0.75 0.61 0.60*  --  0.53*  --  0.52*  CALCIUM 8.9 9.1 8.3* 8.3*  --  8.0*  --  8.3*  MG 2.0 1.8  --   --   --   --  1.5*  --   PHOS 1.6* 1.4*  --   --  <1.0* 2.5  --  2.1*   GFR: Estimated Creatinine Clearance: 95.9 mL/min (A) (by C-G formula based on SCr of 0.52 mg/dL (L)). Recent Labs  Lab 02/11/18 0240 02/12/18 0408 02/14/18 0433 02/15/18 0253  WBC 8.2 6.9 8.1 9.6    Liver Function Tests: No results for input(s): AST, ALT, ALKPHOS, BILITOT, PROT, ALBUMIN in the last 168 hours. No results for input(s): LIPASE, AMYLASE in the last 168 hours. No results for input(s): AMMONIA in the last 168 hours.  ABG    Component Value Date/Time    PHART 7.596 (H) 02/12/2018 0249   PCO2ART 27.7 (L) 02/12/2018 0249   PO2ART 128.0 (H) 02/12/2018 0249   HCO3 27.1 02/12/2018 0249   TCO2 28 02/12/2018 0249   ACIDBASEDEF 3.0 (H) 02/09/2018 0757   O2SAT 99.0 02/12/2018 0249     Coagulation Profile: No results for input(s): INR, PROTIME in the last 168 hours.  Cardiac Enzymes: No results for input(s): CKTOTAL, CKMB, CKMBINDEX, TROPONINI in the last 168 hours.  HbA1C: Hgb A1c MFr Bld  Date/Time Value Ref Range Status  11/01/2017 02:47 PM 5.4 4.8 - 5.6 % Final    Comment:    (NOTE)         Prediabetes: 5.7 - 6.4  Diabetes: >6.4         Glycemic control for adults with diabetes: <7.0   01/14/2017 06:14 AM 5.9 (H) 4.8 - 5.6 % Final    Comment:    (NOTE) Pre diabetes:          5.7%-6.4% Diabetes:              >6.4% Glycemic control for   <7.0% adults with diabetes     CBG: Recent Labs  Lab 02/16/18 1123 02/16/18 1530 02/16/18 1916 02/16/18 2352 02/17/18 0316  GLUCAP 127* 144* 140* 119* 94    Review of Systems:   Intubated   Past Medical History  He,  has a past medical history of Anemia, Anxiety, Asthma, Atrial fibrillation ([place]) (01/2012), Bipolar 1 disorder ([place]), Cancer ([place]), Collagen vascular disease ([place]), Conversion disorder (06/2010), COPD (chronic obstructive pulmonary disease) ([place]), CVA (cerebral vascular accident) ([place]), Dermatomyositis ([place]), Dermatomyositis ([place]), Diverticulitis, Diverticulosis of colon, Dysrhythmia, Esophageal dysmotility, Esophageal stricture, Fibromyalgia, Gastritis, GERD (gastroesophageal reflux disease), Hand fracture, right, Headache(784.0), Hiatal hernia, History of narcotic addiction ([place]), adenomatous colonic polyps, Hyperlipidemia, Hypertension, Internal hemorrhoids, Ischemic heart disease, Major depression, Myocardial infarct ([place]), Narcotic dependence ([place]), Nephrolithiasis, Obesity, OCD (obsessive compulsive disorder), Otosclerosis, Paroxysmal A-fib ([place]), Peripheral  neuropathy, Raynaud's disease, Renal insufficiency, Rheumatoid arthritis(714.0), Sarcoidosis, Seizures ([place]), Subarachnoid hemorrhage ([place]) (01/2012), Tremor, Type II diabetes mellitus ([place]), Urge incontinence, and Vertigo.   Surgical History    Past Surgical History:  Procedure Laterality Date  . BACK SURGERY    . CARDIAC CATHETERIZATION    . CARPAL TUNNEL RELEASE Bilateral   . CATARACT EXTRACTION W/ INTRAOCULAR LENS IMPLANT Left   . COLONOSCOPY N/A 09/27/2012   Procedure: COLONOSCOPY;  Surgeon: Lafayette Dragon, MD;  Location: [facility] ENDOSCOPY;  Service: Endoscopy;  Laterality: N/A;  . ESOPHAGOGASTRODUODENOSCOPY N/A 09/27/2012   Procedure: ESOPHAGOGASTRODUODENOSCOPY (EGD);  Surgeon: Lafayette Dragon, MD;  Location: [facility];  Service: Endoscopy;  Laterality: N/A;  . KNEE ARTHROSCOPY W/ MENISCAL REPAIR Left 2009  . LUMBAR DISC SURGERY    . LYMPH NODE DISSECTION Right 1970's   "neck; dr thought I had Hodgkins; turned out to be sarcoidosis" (11/15/2012)  . squamous papilloma   2010   removed by Dr. Constance Holster ENT, noted on tongue  . TONSILLECTOMY       Social History   reports that he quit smoking about 4 months ago. His smoking use included cigarettes. He has a 15.00 pack-year smoking history. He has never used smokeless tobacco. He reports that he does not drink alcohol or use drugs.   Family History   His family history includes Alcohol abuse in his mother; Coronary artery disease in his father; Depression in his mother; Diabetes in his mother and another family member; Heart disease in his mother; Hepatitis in his brother; Stroke in his mother.   Allergies Allergies  Allergen Reactions  . Immune Globulins Other (See Comments)    Acute renal failure  . Rho (D) Immune Globulin     Acute renal failure Acute renal failure  . Ciprofloxacin Swelling  . Flagyl [Metronidazole] Swelling  . Sulfa Antibiotics Other (See Comments)    blisters  . Sulfasalazine     blisters blisters  . Vancomycin      Diffuse maculopapular rash when receiving both vanc/cefepime.  Timing unclear.    . Bupropion     Unknown  . Bupropion Hcl Other (See Comments)    Unknown  . Cefepime Rash    Diffuse maculopapular rash when receiving both vanc  and cefepime, timing unclear.  Challenged with Cefepime again 01/2018 and developed full body rash.  Tolerated ceftriaxone and ceftazidime 01/2018.  . Clobetasol Other (See Comments)    Unknown Unknown  . Escitalopram Oxalate     Unknown  . Fluoxetine Hcl     Unknown  . Furosemide Rash    Entered in 2011. Re-trialed 2019, developed rash again.  . Meclizine Rash  . Paroxetine Other (See Comments)    Unknown  . Penicillins Other (See Comments)    Unknown reaction - Tolerated ceftriaxone 01/2018 Has patient had a PCN reaction causing immediate rash, facial/tongue/throat swelling, SOB or lightheadedness with hypotension: unknown Has patient had a PCN reaction causing severe rash involving mucus membranes or skin necrosis: unknown Has patient had a PCN reaction that required hospitalization unknown Has patient had a PCN reaction occurring within the last 10 years: unknown If all of the above answers are "NO", then may proceed with Cephalospori  . Tacrolimus Other (See Comments)    Unknown  . Tetanus Toxoid Other (See Comments)    Unknown  . Tuberculin Purified Protein Derivative Other (See Comments)    Unknown     Home Medications  Prior to Admission medications   Medication Sig Start Date End Date Taking? Authorizing Provider  acetaminophen (TYLENOL) 325 MG tablet Take 2 tablets (650 mg total) by mouth every 6 (six) hours as needed for mild pain (or Fever >/= 101). 11/04/17  Yes Debbe Odea, MD  ALPRAZolam Duanne Moron) 0.5 MG tablet Take 1 tablet (0.5 mg total) by mouth 3 (three) times daily. Patient taking differently: Take 0.5 mg by mouth 2 (two) times daily.  10/14/17  Yes Elgergawy, Silver Huguenin, MD  amiodarone (PACERONE) 400 MG tablet Take 1 tablet (400 mg  total) by mouth daily. 11/11/17  Yes Sheikh, Omair Latif, DO  apixaban (ELIQUIS) 5 MG TABS tablet Take 10 mg oral twice daily for total of  days, then transition to 5 mg oral twice daily on 10/21/2017 Patient taking differently: Take 5 mg by mouth 2 (two) times daily.  10/18/17  Yes Elgergawy, Silver Huguenin, MD  atorvastatin (LIPITOR) 40 MG tablet Take 1 tablet (40 mg total) by mouth daily. 09/07/17  Yes Oretha Milch D, MD  benzonatate (TESSALON) 100 MG capsule Take 1 capsule (100 mg total) by mouth 3 (three) times daily. 11/10/17  Yes Sheikh, Omair Latif, DO  carbidopa-levodopa (SINEMET IR) 25-100 MG tablet Take 1 tablet by mouth 2 (two) times daily.   Yes [provider]  divalproex (DEPAKOTE) 250 MG DR tablet Take 1 tablet (250 mg total) by mouth 3 (three) times daily. 09/07/17  Yes Oretha Milch D, MD  ergocalciferol (VITAMIN D2) 50000 units capsule Take 1 capsule (50,000 Units total) by mouth once a week. 09/07/17  Yes Oretha Milch D, MD  guaiFENesin (MUCINEX) 600 MG 12 hr tablet Take 2 tablets (1,200 mg total) by mouth 2 (two) times daily. 10/18/17  Yes Elgergawy, Silver Huguenin, MD  ipratropium-albuterol (DUONEB) 0.5-2.5 (3) MG/3ML SOLN Inhale 3 mLs into the lungs every 6 (six) hours as needed. Patient taking differently: Inhale 3 mLs into the lungs every 6 (six) hours as needed (wheezing).  09/07/17  Yes Oretha Milch D, MD  levETIRAcetam (KEPPRA) 500 MG tablet Take 1 tablet (500 mg total) by mouth 2 (two) times daily. 09/07/17  Yes Oretha Milch D, MD  Lidocaine 4 % PTCH Apply 1 patch topically daily. To each shoulder.  Remove after 12 hours   Yes [provider]  loperamide (IMODIUM A-D) 2 MG tablet Take 2 mg by mouth as needed for diarrhea or loose stools.   Yes [provider]  Multiple Vitamin (MULTIVITAMIN WITH MINERALS) TABS tablet Take 1 tablet by mouth daily. 09/07/17  Yes Oretha Milch D, MD  Nutritional Supplements (RESOURCE 2.0) LIQD Take 90 mLs by mouth 3 (three) times  daily between meals.   Yes [provider]  nystatin (MYCOSTATIN) 100000 UNIT/ML suspension Take 5 mLs (500,000 Units total) by mouth 4 (four) times daily. Patient taking differently: Take 5 mLs by mouth 2 (two) times daily.  11/10/17  Yes Sheikh, Omair Latif, DO  omeprazole (PRILOSEC) 20 MG capsule Take 20 mg by mouth daily.   Yes [provider]  oxybutynin (DITROPAN) 5 MG tablet Take 1 tablet (5 mg total) by mouth 3 (three) times daily. 09/07/17  Yes Oretha Milch D, MD  polyethylene glycol (MIRALAX / GLYCOLAX) packet Take 17 g by mouth daily.   Yes [provider]  Potassium Chloride ER 20 MEQ TBCR Take 20 mEq by mouth every other day. 09/20/17  Yes Patrecia Pour, MD  predniSONE (DELTASONE) 2.5 MG tablet Take 2.5 mg by mouth daily with breakfast.   Yes [provider]  propranolol (INDERAL) 10 MG tablet Take 10 mg by mouth 2 (two) times daily.   Yes [provider]  risperiDONE (RISPERDAL) 1 MG tablet Take 1 tablet (1 mg total) by mouth 2 (two) times daily. 09/07/17  Yes Oretha Milch D, MD  senna-docusate (SENOKOT-S) 8.6-50 MG tablet Take 1 tablet by mouth at bedtime as needed for mild constipation. Patient taking differently: Take 1 tablet by mouth at bedtime.  09/07/17  Yes Oretha Milch D, MD  umeclidinium bromide (INCRUSE ELLIPTA) 62.5 MCG/INH AEPB Inhale 1 puff into the lungs daily.   Yes [provider]  albuterol (PROVENTIL) (2.5 MG/3ML) 0.083% nebulizer solution Take 3 mLs (2.5 mg total) by nebulization every 4 (four) hours as needed for wheezing or shortness of breath. Patient not taking: Reported on 02/09/2018 11/10/17   Raiford Noble Latif, DO  budesonide-formoterol [facility]) 160-4.5 MCG/ACT inhaler Inhale 2 puffs into the lungs 2 (two) times daily. Patient not taking: Reported on 02/09/2018 09/07/17   Oretha Milch D, MD  esomeprazole (NEXIUM) 40 MG capsule Take 1 capsule (40 mg total) by mouth daily. Patient not taking: Reported  on 02/09/2018 09/07/17   Oretha Milch D, MD  fluconazole (DIFLUCAN) 100 MG tablet Take 1 tablet (100 mg total) by mouth daily. Patient not taking: Reported on 02/09/2018 11/11/17   Raiford Noble Latif, DO  lidocaine (LIDODERM) 5 % Place 1 patch onto the skin daily. Remove & Discard patch within 12 hours or as directed by MD Patient not taking: Reported on 02/09/2018 11/11/17   Raiford Noble Latif, DO  loperamide (IMODIUM) 2 MG capsule Take 1 capsule (2 mg total) by mouth as needed for diarrhea or loose stools. Patient not taking: Reported on 02/09/2018 09/07/17   Desiree Hane, MD  Maltodextrin-Xanthan Gum (RESOURCE THICKENUP CLEAR) POWD Take 120 g by mouth as needed. Patient not taking: Reported on 02/09/2018 11/10/17   Raiford Noble Latif, DO  nicotine (NICODERM CQ - DOSED IN MG/24 HOURS) 21 mg/24hr patch Place 1 patch (21 mg total) onto the skin daily. Patient not taking: Reported on 02/09/2018 10/15/17   Elgergawy, Silver Huguenin, MD  nutrition supplement, JUVEN, (JUVEN) PACK Take 1 packet by mouth 2 (two) times daily between meals. Patient not taking: Reported on 02/09/2018 11/04/17  Debbe Odea, MD  predniSONE (DELTASONE) 10 MG tablet Take 0.5 tablets (5 mg total) by mouth daily with breakfast. Patient not taking: Reported on 02/09/2018 10/18/17   Elgergawy, Silver Huguenin, MD     Critical care time: **

## 2018-02-17 NOTE — Progress Notes (Signed)
Physical Therapy Re-evaluation Patient Details Name: Patrick Holt MRN: [id number] DOB: [birth year] Today's Date: 02/17/2018    History of Present Illness 18 yoM w/extensive hx presenting from SNF with AMS, septic shock secondary to pneumococcus pneumonia. Intubated 12/28 and extubated 02/17/18.  PMH includes but not limited to:  CVA, COPD, CA, A fib, MI, HLD, HTN, Conversion disorder, Asthma, Anemia, R hand fracture, major depression, lumbar disk surgery.     PT Comments    Pt admitted with above diagnosis. Pt currently with functional limitations due to the deficits listed below (see PT Problem List). Pt was able to come to eOB with max assist of 2. Sat EOB 10 min with +2 min to min guard assist.  Pt with significantly rigid posture needing stretching with pt showing some decr in tone but would incr tone if asked to assist with sitting.  Pt very weak however feel a trial of PT is warranted.  Goals set 12/28 and continue with those goals as they are still appropriate.   Pt will benefit from skilled PT to increase their independence and safety with mobility to allow discharge to the venue listed below.     Follow Up Recommendations  SNF     Equipment Recommendations  (TBD)    Recommendations for Other Services       Precautions / Restrictions Precautions Precautions: Fall Precaution Comments: flexi seal Restrictions Weight Bearing Restrictions: No    Mobility  Bed Mobility Overal bed mobility: Needs Assistance Bed Mobility: Supine to Sit;Sit to Supine     Supine to sit: Max assist;+2 for physical assistance Sit to supine: Max assist;+2 for physical assistance   General bed mobility comments: Needed asisst to move LES and for elevation of trunk.   Transfers                 General transfer comment: Unable  Ambulation/Gait             General Gait Details: Unable   Stairs             Wheelchair Mobility    Modified Rankin (Stroke Patients Only)        Balance Overall balance assessment: Needs assistance Sitting-balance support: Single extremity supported;Feet unsupported Sitting balance-Leahy Scale: Poor Sitting balance - Comments: Pt sat EOB 10 minutes however never could get pt to place feet on floor.  Pt very rigid with poor core movement.  Pt leans posteriorly and is very weak in trunk.  Pt with flexed knees and would not put feet on floor however pt was using tone and extension to sit EOB needing min guard to min assist.  At times, not holding onto pt and he was holding onto right rail.  Unsure if pt with poor motor planning vs. rigidity.  Pt doeshave Parkinsons.  Postural control: Posterior lean;Right lateral lean                                  Cognition Arousal/Alertness: Awake/alert Behavior During Therapy: Flat affect Overall Cognitive Status: Impaired/Different from baseline Area of Impairment: Orientation;Following commands;Safety/judgement;Awareness;Problem solving                 Orientation Level: Disoriented to;Situation;Time     Following Commands: Follows one step commands inconsistently;Follows one step commands with increased time Safety/Judgement: Decreased awareness of safety;Decreased awareness of deficits   Problem Solving: Slow processing;Difficulty sequencing;Decreased initiation  Exercises      General Comments        Pertinent Vitals/Pain Pain Assessment: Faces Faces Pain Scale: Hurts even more Pain Location: generalized discomfort Pain Descriptors / Indicators: Discomfort;Grimacing Pain Intervention(s): Limited activity within patient's tolerance;Monitored during session;Repositioned    Home Living                      Prior Function            PT Goals (current goals can now be found in the care plan section) Acute Rehab PT Goals Patient Stated Goal: non stated PT Goal Formulation: With patient Time For Goal Achievement: 02/25/18 Potential  to Achieve Goals: Poor Progress towards PT goals: Progressing toward goals    Frequency    Min 2X/week      PT Plan Current plan remains appropriate    Co-evaluation              AM-PAC PT "6 Clicks" Mobility   Outcome Measure  Help needed turning from your back to your side while in a flat bed without using bedrails?: A Lot Help needed moving from lying on your back to sitting on the side of a flat bed without using bedrails?: A Lot Help needed moving to and from a bed to a chair (including a wheelchair)?: Total Help needed standing up from a chair using your arms (e.g., wheelchair or bedside chair)?: Total Help needed to walk in hospital room?: Total Help needed climbing 3-5 steps with a railing? : Total 6 Click Score: 8    End of Session Equipment Utilized During Treatment: Oxygen;Gait belt Activity Tolerance: Patient limited by fatigue Patient left: in bed;with call bell/phone within reach Nurse Communication: Mobility status;Need for lift equipment PT Visit Diagnosis: Muscle weakness (generalized) (M62.81)     Time: 4401-0272 PT Time Calculation (min) (ACUTE ONLY): 17 min  Charges:                        [place] Pager:  [phone]  Office:  [place] 02/17/2018, 4:25 PM

## 2018-02-17 NOTE — Progress Notes (Signed)
Wasted 220 mL Fentanyl in sink with Lucile Crater, RN.

## 2018-02-17 NOTE — Progress Notes (Signed)
PT Cancellation Note  Patient Details Name: Patrick Holt MRN: [id number] DOB: 1950-09-14   Cancelled Treatment:    Reason Eval/Treat Not Completed: Medical issues which prohibited therapy(Nurse asked to HOLD until possible extubation this am. )Will check back this pm.    Denice Paradise 02/17/2018, 9:28 AM Tamber Burtch,PT Acute Rehabilitation Services Pager:  [phone]  Office:  [phone]

## 2018-02-17 NOTE — Procedures (Signed)
Extubation Procedure Note  Patient Details:   Name: [name] DOB: [birth year] MRN: [id number]   Airway Documentation:    Vent end date: 02/17/18 Vent end time: 1250   Evaluation  O2 sats: stable throughout Complications: No apparent complications Patient did tolerate procedure well. Bilateral Breath Sounds: Clear   Yes   Patient was extubated to a 6L [place] without any complications, dyspnea or stridor noted. Positive cuff leak.  Izear Pine, Eddie North 02/17/2018, 12:50 PM

## 2018-02-17 NOTE — Progress Notes (Addendum)
Patient SpO2 dropped in the 80s. Patient placed on a Non-rebreather then a HFNC. SpO2 is still in the 80's with little imporvment Elink MD notified and ordered patient to be placed on Bipap. Currently waiting for RT to place patient on Bipap. Patient does not appear in any distress. HR 77 RR 19 BP 98/76 (83). He is stating that he is "Hot" Temp is 98.1

## 2018-02-17 NOTE — Progress Notes (Signed)
eLink Physician-Brief Progress Note Patient Name: AVERI CACIOPPO DOB: 19-Mar-1950 MRN: [id number]   Date of Service  02/17/2018  HPI/Events of Note  Camera: sats 88. MAP fine. On HFNO. RT at bed side. Extubated earlier.  mentating ok. Able to protect lungs.   eICU Interventions  BiPAP 01/20/11 for now. Get ABG.      Intervention Category Major Interventions: Respiratory failure - evaluation and management  Elmer Sow 02/17/2018, 11:39 PM

## 2018-02-17 NOTE — Progress Notes (Signed)
Patrick Holt was extubated this morning. I met with him this afternoon, his main concern is that he is hungry. He appears to be breathing comfortably but is using the bedside suction for assistance with clearing sputum. We discussed his code status, he wishes to stay full code. He explains that despite the intubation being uncomfortable he would still want full scope of care because he does not feel ready to die. We discussed his family situation, he does not have or want contact with his sister or brother. He would like to establish his friend [name] as his designated healthcare POA. He is interested in meeting with social work regarding the healthcare POA.

## 2018-02-17 NOTE — Progress Notes (Signed)
[facility] ADULT ICU REPLACEMENT PROTOCOL FOR AM LAB REPLACEMENT ONLY  The patient does apply for the [facility] Adult ICU Electrolyte Replacment Protocol based on the criteria listed below:   1. Is GFR >/= 40 ml/min? Yes.    Patient's GFR today is >60 2. Is urine output >/= 0.5 ml/kg/hr for the last 6 hours? Yes.   Patient's UOP is 0.74 ml/kg/hr 3. Is BUN < 60 mg/dL? Yes.    Patient's BUN today is 11 4. Abnormal electrolyte(s): K+=3.3 5. Ordered repletion with: per protocol 6. If a panic level lab has been reported, has the CCM MD in charge been notified? Yes.  .   Physician:  Jake Samples MD  Vilma Prader 02/17/2018 6:40 AM

## 2018-02-18 ENCOUNTER — Inpatient Hospital Stay (HOSPITAL_COMMUNITY): Payer: Medicare Other

## 2018-02-18 LAB — POCT I-STAT 3, ART BLOOD GAS (G3+)
ACID-BASE EXCESS: 14 mmol/L — AB (ref 0.0–2.0)
Acid-Base Excess: 13 mmol/L — ABNORMAL HIGH (ref 0.0–2.0)
Bicarbonate: 39.1 mmol/L — ABNORMAL HIGH (ref 20.0–28.0)
Bicarbonate: 39.7 mmol/L — ABNORMAL HIGH (ref 20.0–28.0)
O2 Saturation: 92 %
O2 Saturation: 94 %
Patient temperature: 97.5
Patient temperature: 97.5
TCO2: 41 mmol/L — ABNORMAL HIGH (ref 22–32)
TCO2: 41 mmol/L — ABNORMAL HIGH (ref 22–32)
pCO2 arterial: 49.5 mmHg — ABNORMAL HIGH (ref 32.0–48.0)
pCO2 arterial: 52.7 mmHg — ABNORMAL HIGH (ref 32.0–48.0)
pH, Arterial: 7.476 — ABNORMAL HIGH (ref 7.350–7.450)
pH, Arterial: 7.51 — ABNORMAL HIGH (ref 7.350–7.450)
pO2, Arterial: 56 mmHg — ABNORMAL LOW (ref 83.0–108.0)
pO2, Arterial: 64 mmHg — ABNORMAL LOW (ref 83.0–108.0)

## 2018-02-18 LAB — GLUCOSE, CAPILLARY
GLUCOSE-CAPILLARY: 82 mg/dL (ref 70–99)
Glucose-Capillary: 73 mg/dL (ref 70–99)
Glucose-Capillary: 79 mg/dL (ref 70–99)
Glucose-Capillary: 91 mg/dL (ref 70–99)
Glucose-Capillary: 86 mg/dL (ref 70–99)

## 2018-02-18 LAB — MAGNESIUM: MAGNESIUM: 1.9 mg/dL (ref 1.7–2.4)

## 2018-02-18 LAB — BASIC METABOLIC PANEL
Anion gap: 9 (ref 5–15)
BUN: 9 mg/dL (ref 8–23)
CO2: 32 mmol/L (ref 22–32)
Calcium: 8.7 mg/dL — ABNORMAL LOW (ref 8.9–10.3)
Chloride: 101 mmol/L (ref 98–111)
Creatinine, Ser: 0.48 mg/dL — ABNORMAL LOW (ref 0.61–1.24)
GFR calc Af Amer: >60 mL/min (ref >60)
GFR calc non Af Amer: >60 mL/min (ref >60)
Glucose, Bld: 73 mg/dL (ref 70–99)
Potassium: 3.6 mmol/L (ref 3.5–5.1)
Sodium: 142 mmol/L (ref 135–145)

## 2018-02-18 LAB — PHOSPHORUS: Phosphorus: 3.8 mg/dL (ref 2.5–4.6)

## 2018-02-18 MED ORDER — DEXTROSE-NACL 5-0.45 % IV SOLN
INTRAVENOUS | Status: AC
  Administered 2018-02-18: 05:00:00 via INTRAVENOUS

## 2018-02-18 MED ORDER — DEXTROSE-NACL 2.5-0.45 % IV SOLN: INTRAVENOUS | Status: DC

## 2018-02-18 MED ORDER — BUMETANIDE 2 MG PO TABS
2.0000 mg | ORAL_TABLET | Freq: Once | ORAL | Status: DC
  Filled 2018-02-18: qty 1

## 2018-02-18 MED ORDER — BUMETANIDE 1 MG PO TABS
1.0000 mg | ORAL_TABLET | Freq: Two times a day (BID) | ORAL | Status: DC
  Filled 2018-02-18 (×3): qty 1

## 2018-02-18 MED ORDER — DEXTROSE-NACL 5-0.45 % IV SOLN
INTRAVENOUS | Status: DC
  Administered 2018-02-18: 22:00:00 via INTRAVENOUS

## 2018-02-18 NOTE — Progress Notes (Signed)
RT placed pt on BIPAP. Pt tolerated well. Pt sats are currently 93%. RT obtained a pre application of BIPAP ABG for base line reference. Pt not in any distress at this time. ABG results are as follows. RT will continue to monitor.   Results for TRAYCE, MAINO (MRN [id number]) as of 02/18/2018 00:21  Ref. Range 02/18/2018 00:02  Sample type Unknown ARTERIAL  pH, Arterial Latest Ref Range: 7.350 - 7.450  7.510 (H)  pCO2 arterial Latest Ref Range: 32.0 - 48.0 mmHg 49.5 (H)  pO2, Arterial Latest Ref Range: 83.0 - 108.0 mmHg 56.0 (L)  TCO2 Latest Ref Range: 22 - 32 mmol/L 41 (H)  Acid-Base Excess Latest Ref Range: 0.0 - 2.0 mmol/L 14.0 (H)  Bicarbonate Latest Ref Range: 20.0 - 28.0 mmol/L 39.7 (H)  O2 Saturation Latest Units: % 92.0  Patient temperature Unknown 97.5 F  Collection site Unknown RADIAL, ALLEN'S TEST ACCEPTABLE

## 2018-02-18 NOTE — Progress Notes (Signed)
Patient's SpO2 in the 80's again despite being on Bipap. Elink MD notified.

## 2018-02-18 NOTE — Evaluation (Signed)
Clinical/Bedside Swallow Evaluation Patient Details  Name: Patrick Holt MRN: [id number] Date of Birth: [birth year]  Today's Date: 02/18/2018 Time: SLP Start Time (ACUTE ONLY): 0932 SLP Stop Time (ACUTE ONLY): 1545 SLP Time Calculation (min) (ACUTE ONLY): 15 min  Past Medical History:  Past Medical History:  Diagnosis Date  . Anemia   . Anxiety   . Asthma   . Atrial fibrillation ([place]) 01/2012   with RVR  . Bipolar 1 disorder ([place])   . Cancer ([place])   . Collagen vascular disease ([place])   . Conversion disorder 06/2010  . COPD (chronic obstructive pulmonary disease) ([place])   . CVA (cerebral vascular accident) ([place])   . Dermatomyositis ([place])   . Dermatomyositis ([place])   . Diverticulitis   . Diverticulosis of colon   . Dysrhythmia    "irregular" (11/15/2012)  . Esophageal dysmotility   . Esophageal stricture   . Fibromyalgia   . Gastritis   . GERD (gastroesophageal reflux disease)   . Hand fracture, right    sept 13, 2017, still wearing splint as og 01-10-2017  . Headache(784.0)    "severe; get them often" (11/15/2012)  . Hiatal hernia   . History of narcotic addiction ([place])   . Hx of adenomatous colonic polyps   . Hyperlipidemia   . Hypertension   . Internal hemorrhoids   . Ischemic heart disease   . Major depression    with acute psychotic break in 06/2010  . Myocardial infarct ([place])    mulitple (1999, 2000, 2004)  . Narcotic dependence ([place])   . Nephrolithiasis   . Obesity   . OCD (obsessive compulsive disorder)   . Otosclerosis   . Paroxysmal A-fib ([place])   . Peripheral neuropathy   . Raynaud's disease   . Renal insufficiency   . Rheumatoid arthritis(714.0)   . Sarcoidosis   . Seizures ([place])   . Subarachnoid hemorrhage ([place]) 01/2012   with subdural  hematoma.   . Tremor   . Type II diabetes mellitus ([place])   . Urge incontinence   . Vertigo    Past Surgical History:  Past Surgical History:  Procedure Laterality Date  . BACK SURGERY    . CARDIAC  CATHETERIZATION    . CARPAL TUNNEL RELEASE Bilateral   . CATARACT EXTRACTION W/ INTRAOCULAR LENS IMPLANT Left   . COLONOSCOPY N/A 09/27/2012   Procedure: COLONOSCOPY;  Surgeon: Lafayette Dragon, MD;  Location: [facility] ENDOSCOPY;  Service: Endoscopy;  Laterality: N/A;  . ESOPHAGOGASTRODUODENOSCOPY N/A 09/27/2012   Procedure: ESOPHAGOGASTRODUODENOSCOPY (EGD);  Surgeon: Lafayette Dragon, MD;  Location: [facility];  Service: Endoscopy;  Laterality: N/A;  . KNEE ARTHROSCOPY W/ MENISCAL REPAIR Left 2009  . LUMBAR DISC SURGERY    . LYMPH NODE DISSECTION Right 1970's   "neck; dr thought I had Hodgkins; turned out to be sarcoidosis" (11/15/2012)  . squamous papilloma   2010   removed by Dr. Constance Holster ENT, noted on tongue  . TONSILLECTOMY     HPI:  Pt is a 68 yo M presenting from SNF with AMS, septic shock felt to be secondary to PNA. Upon arrival he had thick secretions and unchewed food in his oral cavity. Seizure-like activity was noted in the ICU. Pt has been intermittently requiring BiPAP; SLP evaluated most recently on 02/10/18, with recommendation for a few ice chips, meds crushed in puree. Before diet could be progressed, pt required intubation 02/11/18 to 02/17/18; off BiPAP and on HFNC since this morning. Pt now DNR. Pt has had BSEs  before (01/2017, 02/2017) recommending Dys 3 diet, thin liquids as swallow appeared functional but pt preferred softer foods given lack of dentition. In September 2019 pt was evaluated with MBS after CVA with deep penetration of thin liquids that couldn't be cleared. Dys 2 diet and nectar thick liquids recommended. Complex PMHx includes: atrial fibrillation on Eliquis, CHF, COPD, sarcoidosis, bipolar disorder, conversion disorder, DM, SAH, seizures, MI, HTN, HLD, HH, GERD, esophageal stricture, esophageal dysmotility, CVA   Assessment / Plan / Recommendation Clinical Impression   Pt presents with multiple risk factors for aspiration, with history of dysphagia, deconditioning, cognitive  impairment, decreased respiratory status, now s/p 6 day intubation. SLP assessed pt with ice chips after oral care completed. Pt with consistent immediate or delayed coughing, productive of thick, yellow secretions. Pt not yet ready for diet initiation, and will likely need instrumental testing when appropriate from a medical standpoint. Recommend NPO at this time. SLP will follow for PO/instrumental readiness.    SLP Visit Diagnosis: Dysphagia, unspecified (R13.10)    Aspiration Risk  Severe aspiration risk    Diet Recommendation NPO   Medication Administration: Via alternative means    Other  Recommendations Oral Care Recommendations: Oral care QID Other Recommendations: Have oral suction available   Follow up Recommendations Skilled Nursing facility      Frequency and Duration min 2x/week  2 weeks       Prognosis Prognosis for Safe Diet Advancement: Fair Barriers to Reach Goals: Other (Comment)(comorbidities)      Swallow Study   General Date of Onset: 02/14/2018 HPI: Pt is a 68 yo M presenting from SNF with AMS, septic shock felt to be secondary to PNA. Upon arrival he had thick secretions and unchewed food in his oral cavity. Seizure-like activity was noted in the ICU. Pt has been intermittently requiring BiPAP; SLP evaluated most recently on 02/10/18, with recommendation for a few ice chips, meds crushed in puree. Before diet could be progressed, pt required intubation 02/11/18 to 02/17/18; off BiPAP and on HFNC since this morning. Pt now DNR. Pt has had BSEs before (01/2017, 02/2017) recommending Dys 3 diet, thin liquids as swallow appeared functional but pt preferred softer foods given lack of dentition. In September 2019 pt was evaluated with MBS after CVA with deep penetration of thin liquids that couldn't be cleared. Dys 2 diet and nectar thick liquids recommended. Complex PMHx includes: atrial fibrillation on Eliquis, CHF, COPD, sarcoidosis, bipolar disorder, conversion  disorder, DM, SAH, seizures, MI, HTN, HLD, HH, GERD, esophageal stricture, esophageal dysmotility, CVA Type of Study: Bedside Swallow Evaluation Previous Swallow Assessment: see HPI Diet Prior to this Study: NPO Temperature Spikes Noted: No Respiratory Status: Nasal cannula History of Recent Intubation: Yes Length of Intubations (days): 6 days Date extubated: 02/17/18 Behavior/Cognition: Lethargic/Drowsy;Cooperative Oral Cavity Assessment: Dry Oral Care Completed by SLP: Yes Oral Cavity - Dentition: Edentulous Self-Feeding Abilities: Total assist Patient Positioning: Upright in bed Baseline Vocal Quality: Normal Volitional Cough: Weak Volitional Swallow: Able to elicit    Oral/Motor/Sensory Function Overall Oral Motor/Sensory Function: Mild impairment Facial ROM: Reduced left   Ice Chips Ice chips: Impaired Presentation: Spoon Pharyngeal Phase Impairments: Cough - Immediate   Thin Liquid Thin Liquid: Not tested    Nectar Thick Nectar Thick Liquid: Not tested   Honey Thick Honey Thick Liquid: Not tested   Puree Puree: Not tested   Solid     Solid: Not tested     Deneise Lever, MS, [place] Pager: [phone]  Office: [phone]  Aliene Altes 02/18/2018,6:51 PM

## 2018-02-18 NOTE — Progress Notes (Signed)
SLP Cancellation Note  Patient Details Name: MUATH HALLAM MRN: [id number] DOB: [birth year]   Cancelled treatment:       Reason Eval/Treat Not Completed: Medical issues which prohibited therapy. Currently on Bi-PAP. Will follow up when medically ready.  Deneise Lever, [place], CCC-SLP Speech-Language Pathologist Acute Rehabilitation Services Pager: [phone] Office: [phone]    Aliene Altes 02/18/2018, 9:36 AM

## 2018-02-18 NOTE — Progress Notes (Signed)
CPT not done at this time due to patient request.

## 2018-02-18 NOTE — Progress Notes (Signed)
RT obtained repeat ABG after application of BIPAP. Pt ABG results as follows. RT will continue to monitor.  Results for Patrick Holt, Patrick Holt (MRN [id number]) as of 02/18/2018 03:13  Ref. Range 02/18/2018 03:10  Sample type Unknown ARTERIAL  pH, Arterial Latest Ref Range: 7.350 - 7.450  7.476 (H)  pCO2 arterial Latest Ref Range: 32.0 - 48.0 mmHg 52.7 (H)  pO2, Arterial Latest Ref Range: 83.0 - 108.0 mmHg 64.0 (L)  TCO2 Latest Ref Range: 22 - 32 mmol/L 41 (H)  Acid-Base Excess Latest Ref Range: 0.0 - 2.0 mmol/L 13.0 (H)  Bicarbonate Latest Ref Range: 20.0 - 28.0 mmol/L 39.1 (H)  O2 Saturation Latest Units: % 94.0  Patient temperature Unknown 97.5 F  Collection site Unknown RADIAL, ALLEN'S TEST ACCEPTABLE

## 2018-02-18 NOTE — Progress Notes (Signed)
CPT not done at this time. Pt is sleeping.

## 2018-02-18 NOTE — Progress Notes (Signed)
eLink Physician-Brief Progress Note Patient Name: Patrick Holt DOB: 11-25-1950 MRN: [id number]   Date of Service  02/18/2018  HPI/Events of Note  Camera: on BiPAP 12/6/100%.  sats 90%. Able to protect airways, follows commands.  Earlier ABG met alkalosis. K ok  eICU Interventions  CxR stat BiPAP to 18/8/14. Follow ABG in 30 mins. If worsens re intubate.      Intervention Category Intermediate Interventions: Respiratory distress - evaluation and management  Elmer Sow 02/18/2018, 5:06 AM

## 2018-02-18 NOTE — Plan of Care (Signed)
Plan of Care Note  Patient seen for increasing hypoxemia.  Per RT he underwent oral care, oral suctioning etc and had no problems.  However later noted to require increasing BPAP fio2 from 30 to 100%. spo2 94-99% with good waveform.  Patient states his breathing is "good" - feels as well as he did this morning with no complaints. Mentation appropriate.  CXR reviewed and has persistent bilateral effusions.    Will increase AM dose of bumex to 2 mg given his repeated reaction to lasix.  No indication for intubation at this time, mentating well with RR low 20's and no overt respiratory distress.  Mali Alicea Wente, MD [facility] Pulmonology/Critical Care Pager [phone] After hours pager: [phone]

## 2018-02-18 NOTE — Progress Notes (Signed)
eLink Physician-Brief Progress Note Patient Name: Patrick Holt DOB: 10/05/1950 MRN: [id number]   Date of Service  02/18/2018  HPI/Events of Note  Allergic to lasix-rash. NPO. CxR stable effusion/chf. fio2 improving. K 3.7.  eICU Interventions  Continue care. Consider diuresis with bumex via NG tube in AM.      Intervention Category Intermediate Interventions: Hypervolemia - evaluation and management  Elmer Sow 02/18/2018, 6:21 AM

## 2018-02-18 NOTE — Progress Notes (Signed)
Pt taken off Bipap and placed on heated high flow [place] at 25L, 70%.  Sat 88 - 90%.  Pt tolerating well at this time.

## 2018-02-18 NOTE — Progress Notes (Signed)
eLink Physician-Brief Progress Note Patient Name: Patrick Holt DOB: May 22, 1950 MRN: [id number]   Date of Service  02/18/2018  HPI/Events of Note  NPO, BG lower.   eICU Interventions  D5 half NS at 50 ml/hr for now.      Intervention Category Minor Interventions: Routine modifications to care plan (e.g. PRN medications for pain, fever)  Elmer Sow 02/18/2018, 4:49 AM

## 2018-02-18 NOTE — Progress Notes (Signed)
NAME:  Patrick Holt, MRN:  [id number], DOB:  07-Mar-1950, LOS: 9 ADMISSION DATE:  01/24/2018, CONSULTATION DATE:  02/09/2018 REFERRING MD:  [name] Carnes, PA, CHIEF COMPLAINT:  Altered mental status  Brief History   49 yoM w/extensive hx presenting from SNF with AMS, septic shock secondary to pneumococcus pneumonia.  Placed on BiPAP and Levophed and admitted to ICU.  Past Medical History  Anemia, Anxiety, Asthma, Atrial fibrillation on Eliquis [facility]) (01/2012), Bipolar 1 disorder ([place]), Cancer ([place]), Collagen vascular disease ([place]), Conversion disorder (06/2010), COPD (chronic obstructive pulmonary disease) ([place]), CVA (cerebral vascular accident) ([place]), Dermatomyositis ([place]), Dermatomyositis ([place]), Diverticulitis, Diverticulosis of colon, Dysrhythmia, Esophageal dysmotility, Esophageal stricture, Fibromyalgia, Gastritis, GERD (gastroesophageal reflux disease), Hand fracture, right, Headache(784.0), Hiatal hernia, History of narcotic addiction ([place]), adenomatous colonic polyps, Hyperlipidemia, Hypertension, Internal hemorrhoids, Ischemic heart disease, Major depression, Myocardial infarct ([place]), Narcotic dependence ([place]), Nephrolithiasis, Obesity, OCD (obsessive compulsive disorder), Otosclerosis, Paroxysmal A-fib ([place]), Peripheral neuropathy, Raynaud's disease, Renal insufficiency, Rheumatoid arthritis(714.0), Sarcoidosis, Seizures ([place]), Subarachnoid hemorrhage ([place]) (01/2012), Tremor, Type II diabetes mellitus ([place]), Urge incontinence, and Vertigo.  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 rate (!) 21, height _0  (1.753 m), weight 85.7 kg, SpO2 96 %.    Vent Mode: PSV;CPAP FiO2 (%):  [30 %-80 %] 70 % PEEP:  [5 cmH20] 5 cmH20 Pressure Support:  [8 cmH20] 8 cmH20   Intake/Output Summary (Last 24 hours) at 02/18/2018 0915 Last data filed at 02/18/2018 0900 Gross per 24 hour  Intake 1147.32 ml  Output 1535 ml  Net -387.68 ml   Filed Weights   02/16/18 0357 02/17/18 0500 02/18/18 0422  Weight: 87.4 kg 85.8 kg 85.7 kg   Examination:   General:  Resting comfortably in bed, BIPAP  HENT: NCAT BIPAP mask in place PULM: diminished bases, BIPAP CV: RRR, no mgr GI: BS+, soft, nontender MSK: normal bulk and tone Derm: edema, thin skin Neuro: awake, alert, no distress, MAEW    Resolved Hospital Problem list   AKI QTc prolongation Septic shock: nearly resolved, 1/1 sedation related hypotension Drug rash: furosemide  Assessment & Plan:  Acute on chronic hypoxic respiratory failure : still problematic after extubation Pneumococcus pneumonia Enterobacter pneumonia Hx COPD, tobacco abuse, sarcoid (not on home O2), also noted some component of probable pulmonary hypertension given PASP 51 on TTE 10/2017  Left sided mucous plug Acute pulmonary edema and anasarca P:  DNR status Wean off BIPAP Attempt High Flow O2 > 88% Diurese with bumex Continue brovana/pulmicort  Acute toxic metabolic encephalopathy - resolved, MRI negative, ICU delirium Hx seizures, conversion disorder, bipolar  Severe anxiety here > improved P:  Continue valproate,  keppra, sinemet, risperdone, clonazepam PAD   AKI> resolved P:  Monitor  BMET and UOP Replace electrolytes as needed   Prolonged QTc: resolved Afib on Eliquis, has episodes of bradycardia  - currently rate controlled P:  Continue eliquis Continue to hold metoprolol and amiodarone Tele   Hyperglycemia P:  Monitor glucose  RA/ sarcoidosis P  Prednisone 2.73m daily to continue  HLD P:  Continue home statin   Protein calorie malnutrition with previous failure to thrive P:  Continue tube feeding  Code status: I met with him at length this morning and discussed his code status.  We discussed his critical condition and the need that should he go back on a ventilator he would require prolonged ICU care and a tracheostomy. We discussed that CPR in the event of cardiac arrest would be devestating and he would not recover to home given his overall frail state and acute illness. He voiced understanding and agreed that he does not want life support or CPR.  Code status DNR.  Will continue full support otherwise but will make him comfortable if no improvement.  Continue to [facility] status with his friend  Feeding: tube feding Analgesia: at this point not needed Sedation: n/a Thromboprophylaxis: eliquis HOB >30 degrees Ulcer prophylaxis: famotidine Glucose control: monitor glucose   My cc time 35 minutes  BRoselie Awkward MD [facility] Pager: [phone]: [phone] no response, call [phone]

## 2018-02-19 DIAGNOSIS — J90 Pleural effusion, not elsewhere classified: Secondary | ICD-10-CM

## 2018-02-19 DIAGNOSIS — R509 Fever, unspecified: Secondary | ICD-10-CM

## 2018-02-19 DIAGNOSIS — I959 Hypotension, unspecified: Secondary | ICD-10-CM

## 2018-02-19 LAB — BASIC METABOLIC PANEL
Anion gap: 9 (ref 5–15)
BUN: 5 mg/dL — ABNORMAL LOW (ref 8–23)
CALCIUM: 8.9 mg/dL (ref 8.9–10.3)
CO2: 27 mmol/L (ref 22–32)
Chloride: 102 mmol/L (ref 98–111)
Creatinine, Ser: 0.46 mg/dL — ABNORMAL LOW (ref 0.61–1.24)
GFR calc Af Amer: >60 mL/min (ref >60)
GFR calc non Af Amer: >60 mL/min (ref >60)
Glucose, Bld: 84 mg/dL (ref 70–99)
Potassium: 3.9 mmol/L (ref 3.5–5.1)
Sodium: 138 mmol/L (ref 135–145)

## 2018-02-19 LAB — GLUCOSE, CAPILLARY
GLUCOSE-CAPILLARY: 91 mg/dL (ref 70–99)
GLUCOSE-CAPILLARY: 95 mg/dL (ref 70–99)
Glucose-Capillary: 87 mg/dL (ref 70–99)
Glucose-Capillary: 88 mg/dL (ref 70–99)

## 2018-02-19 MED ORDER — AMIODARONE HCL IN DEXTROSE 360-4.14 MG/200ML-% IV SOLN: 30.0000 mg/h | INTRAVENOUS | Status: DC

## 2018-02-19 MED ORDER — ONDANSETRON HCL 4 MG/2ML IJ SOLN: 4.0000 mg | Freq: Four times a day (QID) | INTRAMUSCULAR | Status: DC | PRN

## 2018-02-19 MED ORDER — MORPHINE SULFATE (CONCENTRATE) 10 MG/0.5ML PO SOLN: 5.0000 mg | ORAL | Status: DC | PRN

## 2018-02-19 MED ORDER — POLYVINYL ALCOHOL 1.4 % OP SOLN
1.0000 [drp] | Freq: Four times a day (QID) | OPHTHALMIC | Status: DC | PRN
  Filled 2018-02-19: qty 15

## 2018-02-19 MED ORDER — ONDANSETRON 4 MG PO TBDP: 4.0000 mg | ORAL_TABLET | Freq: Four times a day (QID) | ORAL | Status: DC | PRN

## 2018-02-19 MED ORDER — LORAZEPAM 1 MG PO TABS: 1.0000 mg | ORAL_TABLET | ORAL | Status: DC | PRN

## 2018-02-19 MED ORDER — GLYCOPYRROLATE 1 MG PO TABS: 1.0000 mg | ORAL_TABLET | ORAL | Status: DC | PRN

## 2018-02-19 MED ORDER — ACETAMINOPHEN 650 MG RE SUPP: 650.0000 mg | Freq: Four times a day (QID) | RECTAL | Status: DC | PRN

## 2018-02-19 MED ORDER — ACETAMINOPHEN 325 MG PO TABS: 650.0000 mg | ORAL_TABLET | Freq: Four times a day (QID) | ORAL | Status: DC | PRN

## 2018-02-19 MED ORDER — FAMOTIDINE 40 MG/5ML PO SUSR: 20.0000 mg | Freq: Every day | ORAL | Status: DC

## 2018-02-19 MED ORDER — BIOTENE DRY MOUTH MT LIQD: 15.0000 mL | OROMUCOSAL | Status: DC | PRN

## 2018-02-19 MED ORDER — MORPHINE SULFATE (PF) 2 MG/ML IV SOLN
1.0000 mg | INTRAVENOUS | Status: DC | PRN
  Administered 2018-02-20: 1 mg via INTRAVENOUS
  Filled 2018-02-19: qty 1

## 2018-02-19 MED ORDER — AMIODARONE LOAD VIA INFUSION
150.0000 mg | Freq: Once | INTRAVENOUS | Status: AC
  Administered 2018-02-19: 150 mg via INTRAVENOUS
  Filled 2018-02-19: qty 83.34

## 2018-02-19 MED ORDER — LORAZEPAM 2 MG/ML IJ SOLN
1.0000 mg | INTRAMUSCULAR | Status: DC | PRN
  Administered 2018-02-19 – 2018-02-20 (×2): 1 mg via INTRAVENOUS
  Filled 2018-02-19 (×2): qty 1

## 2018-02-19 MED ORDER — AMIODARONE HCL IN DEXTROSE 360-4.14 MG/200ML-% IV SOLN
60.0000 mg/h | INTRAVENOUS | Status: DC
  Administered 2018-02-19: 60 mg/h via INTRAVENOUS
  Filled 2018-02-19: qty 200

## 2018-02-19 MED ORDER — ONDANSETRON 4 MG PO TBDP
4.0000 mg | ORAL_TABLET | Freq: Four times a day (QID) | ORAL | Status: DC | PRN
  Filled 2018-02-19: qty 1

## 2018-02-19 MED ORDER — LORAZEPAM 2 MG/ML PO CONC: 1.0000 mg | ORAL | Status: DC | PRN

## 2018-02-19 MED ORDER — DIPHENHYDRAMINE HCL 50 MG/ML IJ SOLN: 12.5000 mg | INTRAMUSCULAR | Status: DC | PRN

## 2018-02-19 MED ORDER — GLYCOPYRROLATE 0.2 MG/ML IJ SOLN: 0.2000 mg | INTRAMUSCULAR | Status: DC | PRN

## 2018-02-19 MED ORDER — GLYCOPYRROLATE 0.2 MG/ML IJ SOLN
0.2000 mg | INTRAMUSCULAR | Status: DC | PRN
  Administered 2018-02-20: 0.2 mg via INTRAVENOUS
  Filled 2018-02-19 (×2): qty 1

## 2018-02-19 NOTE — Progress Notes (Signed)
SLP Cancellation Note  Patient Details Name: GUNTHER ZAWADZKI MRN: [id number] DOB: 05-13-1950   Cancelled treatment:       Reason Eval/Treat Not Completed: Medical issues which prohibited therapy  Patient is now on high flow nasal canula at 25 liters per minute and unable to transfer to radiology.  ST will follow up next date to assess for readiness for MBS or possible need for FEES.   Shelly Flatten, MA, CCC-SLP Acute Rehab SLP [phone]  Lamar Sprinkles 02/19/2018, 11:46 AM

## 2018-02-19 NOTE — Progress Notes (Signed)
Pt is refusing CPT at this time.  RT did swab pt's mouth and was able to suction the back of his throat with a small amount of thick yellow tan secretions.  Pt tolerated well.

## 2018-02-19 NOTE — Progress Notes (Signed)
Pt placed back on Bipap due to increased HR and desat.  RN at bedside.

## 2018-02-19 NOTE — Progress Notes (Signed)
Pt taken off Bipap and placed on HFNC per patient request.  Pt tolerating well at this time.

## 2018-02-19 NOTE — Progress Notes (Signed)
NAME:  Patrick [place], MRN:  [id number], DOB:  Jan 26, 1951, LOS: 15 ADMISSION DATE:  01/28/2018, CONSULTATION DATE:02/09/2018 REFERRING PH:XTAV Baird Cancer, PA, CHIEF COMPLAINT:Altered mental status  Brief History   68 year old man presented from a SNF with AMS found to be in septic shock secondary to pneumococcal pneumonia. Placed on BiPAP and Levophed and admit to the ICU.  Past Medical History  Asthma, COPD, afib (on eliquis),T2DM, HTN, HLD, Dermatomyositis with esophogeal stricture, RA, sarcoidosis, CVA, subarachnoid hemorrhage, Diverticulosis, hiatal hernia, Depression, Anxiety, Bipolar 1  Significant Hospital Events   12/26 Admitted, Weaned off pressors 12/27 Transfer from ICU 12/30 Rash 1/3 extubated   Consults:  Neurology signed off 12/28  Procedures:  12/28 ETT > 1/3  Significant Diagnostic Tests:  12/27 MRI brain > advanced chronic ischemic microangiopathy, without intracranial abnormality  Micro Data:  12/25 BC >> No growth 12/25 UC >> No growth 12/25 MRSA PCR >> neg 12/25 Flu neg 12/26 Urine pneumococcus- positve 12/28 BAL > enterobacter  Antimicrobials:  12/25 vancomycin (allergy reviewed, 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 rate (!) 23, height _0  (1.753 m), weight 85.6 kg, SpO2 95 %.    FiO2 (%):  [60 %-100 %] 90 %   Intake/Output Summary (Last 24 hours) at 02/19/2018 0737 Last data filed at 02/19/2018 0500 Gross per 24 hour  Intake 970.9 ml  Output 2250 ml  Net -1279.1 ml   Filed Weights   02/17/18 0500 02/18/18 0422 02/19/18 0500  Weight: 85.8 kg 85.7 kg 85.6 kg    Examination: General: chronically ill appearing    HENT: on HFNC Lungs: clear to auscultation, some accessory muscle use  Cardiovascular: regular rate and rhythm, no murmur appreciated  Abdomen: bowel sounds delayed, soft, non tender  Extremities: no lower extremity edema  Neuro: awake and alert  GU: foley catheter   Resolved Hospital Problem list   AKI QTc prolongation Furosemide related rash Septic shock, sedation related hypotension  Toxic metabolic encephalopathy, ICU delirium  Electrolyte abnormalities 2/2 diuresis   Acute pulmonary edema  Mucus plug of the left bronchus   Assessment & Plan:  68 year old man admit from a nursing facility with pneumococcal pneumonia and septic shock. He has completed a 7 day course of antibiotics and was successfully weaned off of pressors. He was extubated 1/3.    Acute on chronic hypoxic respiratory failure secondary to pneumococcal pneumonia Hx of COPD, tobacco abuse, sarcoidosis, ? Pulmonary hypertension - weaned from bipap to HFNC  - diuresis with budesonide 1 mg BID is on hold as he is NPO  - continue brovana/ pulmicort   Aspiration risk  - SLP still recommending NPO for severe aspiration risk  - Will consider placing NG tube today   Afib  - rate controlled off of amiodarone and beta blocker, continue to hold  - continue eliquis for stroke ppx, currently on hold for NPO   Hx seizures, conversion disorder, bipolar  Anxiety  - continue valproate, keppra, sinemet, clonazepam   Hx of RA/ sarcoidosis  - continue home prednisone 2.5 mg qd   Hyperglycemia  - continue to monitor   Hyperlipidemia  - continue home statin   Best practice:  Diet: tube  feeds vital AF  Pain/Anxiety/Delirium protocol (if indicated): not indicated  VAP protocol (if indicated): not indicated  DVT prophylaxis: eliquis on hold as he is NPO  GI prophylaxis:famotidineon hold as he is NPO  Glucose control:monitoring CBG Code Status:DNR Family Communication: CSW attempted to reach family  yesterday without success, non at bedside  Disposition: guarded, extubation will probably be terminal   Labs   CBC: Recent Labs  Lab 02/14/18 0433 02/15/18 0253  WBC 8.1 9.6  NEUTROABS 4.4 6.8  HGB 12.6* 13.2  HCT 39.2 41.8  MCV 92.9 93.9  PLT 165 952    Basic Metabolic Panel: Recent Labs  Lab 02/16/18 0359 02/16/18 1408 02/16/18 1640 02/17/18 0512 02/17/18 1649 02/18/18 0431 02/19/18 0310  NA  --  143  --  141 143 142 138  K  --  2.6*  --  3.3* 3.7 3.6 3.9  CL  --  96*  --  96* 100 101 102  CO2  --  37*  --  35* 35* 32 27  GLUCOSE  --  174*  --  107* 104* 73 84  BUN  --  11  --  _0 5*  CREATININE  --  0.53*  --  0.52* 0.51* 0.48* 0.46*  CALCIUM  --  8.0*  --  8.3* 8.4* 8.7* 8.9  MG  --   --  1.5*  --  2.0 1.9  --   PHOS <1.0* 2.5  --  2.1* 3.9 3.8  --    GFR: Estimated Creatinine Clearance: 95.9 mL/min (A) (by C-G formula based on SCr of 0.46 mg/dL (L)). Recent Labs  Lab 02/14/18 0433 02/15/18 0253  WBC 8.1 9.6    Liver Function Tests: No results for input(s): AST, ALT, ALKPHOS, BILITOT, PROT, ALBUMIN in the last 168 hours. No results for input(s): LIPASE, AMYLASE in the last 168 hours. No results for input(s): AMMONIA in the last 168 hours.  ABG    Component Value Date/Time   PHART 7.476 (H) 02/18/2018 0310   PCO2ART 52.7 (H) 02/18/2018 0310   PO2ART 64.0 (L) 02/18/2018 0310   HCO3 39.1 (H) 02/18/2018 0310   TCO2 41 (H) 02/18/2018 0310   ACIDBASEDEF 3.0 (H) 02/09/2018 0757   O2SAT 94.0 02/18/2018 0310     Coagulation Profile: No results for input(s): INR, PROTIME in the last 168 hours.  Cardiac Enzymes: No results for input(s): CKTOTAL, CKMB, CKMBINDEX, TROPONINI in the last 168 hours.  HbA1C: Hgb A1c MFr Bld  Date/Time Value Ref Range Status  11/01/2017 02:47 PM 5.4 4.8 - 5.6 % Final    Comment:    (NOTE)         Prediabetes: 5.7 - 6.4         Diabetes: >6.4         Glycemic control for adults with diabetes: <7.0   01/14/2017  06:14 AM 5.9 (H) 4.8 - 5.6 % Final    Comment:    (NOTE) Pre diabetes:          5.7%-6.4% Diabetes:              >6.4% Glycemic control for   <7.0% adults with diabetes     CBG: Recent Labs  Lab 02/18/18 1221 02/18/18 1627 02/18/18 2009 02/18/18 2350 02/19/18 0343  GLUCAP 79 91 86 87 88    Review of Systems:   Endorses cough, fatigue. Denies pain   Past Medical History  He,  has a past medical history of Anemia, Anxiety,  Asthma, Atrial fibrillation ([place]) (01/2012), Bipolar 1 disorder ([place]), Cancer ([place]), Collagen vascular disease ([place]), Conversion disorder (06/2010), COPD (chronic obstructive pulmonary disease) ([place]), CVA (cerebral vascular accident) ([place]), Dermatomyositis ([place]), Dermatomyositis ([place]), Diverticulitis, Diverticulosis of colon, Dysrhythmia, Esophageal dysmotility, Esophageal stricture, Fibromyalgia, Gastritis, GERD (gastroesophageal reflux disease), Hand fracture, right, Headache(784.0), Hiatal hernia, History of narcotic addiction ([place]), adenomatous colonic polyps, Hyperlipidemia, Hypertension, Internal hemorrhoids, Ischemic heart disease, Major depression, Myocardial infarct ([place]), Narcotic dependence ([place]), Nephrolithiasis, Obesity, OCD (obsessive compulsive disorder), Otosclerosis, Paroxysmal A-fib ([place]), Peripheral neuropathy, Raynaud's disease, Renal insufficiency, Rheumatoid arthritis(714.0), Sarcoidosis, Seizures ([place]), Subarachnoid hemorrhage ([place]) (01/2012), Tremor, Type II diabetes mellitus ([place]), Urge incontinence, and Vertigo.   Surgical History    Past Surgical History:  Procedure Laterality Date  . BACK SURGERY    . CARDIAC CATHETERIZATION    . CARPAL TUNNEL RELEASE Bilateral   . CATARACT EXTRACTION W/ INTRAOCULAR LENS IMPLANT Left   . COLONOSCOPY N/A 09/27/2012   Procedure: COLONOSCOPY;  Surgeon: Lafayette Dragon, MD;  Location: [facility];  Service: Endoscopy;  Laterality: N/A;  . ESOPHAGOGASTRODUODENOSCOPY N/A 09/27/2012   Procedure:  ESOPHAGOGASTRODUODENOSCOPY (EGD);  Surgeon: Lafayette Dragon, MD;  Location: [facility];  Service: Endoscopy;  Laterality: N/A;  . KNEE ARTHROSCOPY W/ MENISCAL REPAIR Left 2009  . LUMBAR DISC SURGERY    . LYMPH NODE DISSECTION Right 1970's   "neck; dr thought I had Hodgkins; turned out to be sarcoidosis" (11/15/2012)  . squamous papilloma   2010   removed by Dr. Constance Holster ENT, noted on tongue  . TONSILLECTOMY       Social History   reports that he quit smoking about 4 months ago. His smoking use included cigarettes. He has a 15.00 pack-year smoking history. He has never used smokeless tobacco. He reports that he does not drink alcohol or use drugs.   Family History   His family history includes Alcohol abuse in his mother; Coronary artery disease in his father; Depression in his mother; Diabetes in his mother and another family member; Heart disease in his mother; Hepatitis in his brother; Stroke in his mother.   Allergies Allergies  Allergen Reactions  . Immune Globulins Other (See Comments)    Acute renal failure  . Rho (D) Immune Globulin     Acute renal failure Acute renal failure  . Ciprofloxacin Swelling  . Flagyl [Metronidazole] Swelling  . Sulfa Antibiotics Other (See Comments)    blisters  . Sulfasalazine     blisters blisters  . Vancomycin     Diffuse maculopapular rash when receiving both vanc/cefepime.  Timing unclear.    . Bupropion     Unknown  . Bupropion Hcl Other (See Comments)    Unknown  . Cefepime Rash    Diffuse maculopapular rash when receiving both vanc and cefepime, timing unclear.  Challenged with Cefepime again 01/2018 and developed full body rash.  Tolerated ceftriaxone and ceftazidime 01/2018.  . Clobetasol Other (See Comments)    Unknown Unknown  . Escitalopram Oxalate     Unknown  . Fluoxetine Hcl     Unknown  . Furosemide Rash    Entered in 2011. Re-trialed 2019, developed rash again.  . Meclizine Rash  . Paroxetine Other (See Comments)     Unknown  . Penicillins Other (See Comments)    Unknown reaction - Tolerated ceftriaxone 01/2018 Has patient had a PCN reaction causing immediate rash, facial/tongue/throat swelling, SOB or lightheadedness with hypotension: unknown Has patient had a PCN reaction causing severe rash involving mucus membranes or  skin necrosis: unknown Has patient had a PCN reaction that required hospitalization unknown Has patient had a PCN reaction occurring within the last 10 years: unknown If all of the above answers are "NO", then may proceed with Cephalospori  . Tacrolimus Other (See Comments)    Unknown  . Tetanus Toxoid Other (See Comments)    Unknown  . Tuberculin Purified Protein Derivative Other (See Comments)    Unknown     Home Medications  Prior to Admission medications   Medication Sig Start Date End Date Taking? Authorizing Provider  acetaminophen (TYLENOL) 325 MG tablet Take 2 tablets (650 mg total) by mouth every 6 (six) hours as needed for mild pain (or Fever >/= 101). 11/04/17  Yes Debbe Odea, MD  ALPRAZolam Duanne Moron) 0.5 MG tablet Take 1 tablet (0.5 mg total) by mouth 3 (three) times daily. Patient taking differently: Take 0.5 mg by mouth 2 (two) times daily.  10/14/17  Yes Elgergawy, Silver Huguenin, MD  amiodarone (PACERONE) 400 MG tablet Take 1 tablet (400 mg total) by mouth daily. 11/11/17  Yes Sheikh, Omair Latif, DO  apixaban (ELIQUIS) 5 MG TABS tablet Take 10 mg oral twice daily for total of  days, then transition to 5 mg oral twice daily on 10/21/2017 Patient taking differently: Take 5 mg by mouth 2 (two) times daily.  10/18/17  Yes Elgergawy, Silver Huguenin, MD  atorvastatin (LIPITOR) 40 MG tablet Take 1 tablet (40 mg total) by mouth daily. 09/07/17  Yes Oretha Milch D, MD  benzonatate (TESSALON) 100 MG capsule Take 1 capsule (100 mg total) by mouth 3 (three) times daily. 11/10/17  Yes Sheikh, Omair Latif, DO  carbidopa-levodopa (SINEMET IR) 25-100 MG tablet Take 1 tablet by mouth 2 (two) times  daily.   Yes [provider]  divalproex (DEPAKOTE) 250 MG DR tablet Take 1 tablet (250 mg total) by mouth 3 (three) times daily. 09/07/17  Yes Oretha Milch D, MD  ergocalciferol (VITAMIN D2) 50000 units capsule Take 1 capsule (50,000 Units total) by mouth once a week. 09/07/17  Yes Oretha Milch D, MD  guaiFENesin (MUCINEX) 600 MG 12 hr tablet Take 2 tablets (1,200 mg total) by mouth 2 (two) times daily. 10/18/17  Yes Elgergawy, Silver Huguenin, MD  ipratropium-albuterol (DUONEB) 0.5-2.5 (3) MG/3ML SOLN Inhale 3 mLs into the lungs every 6 (six) hours as needed. Patient taking differently: Inhale 3 mLs into the lungs every 6 (six) hours as needed (wheezing).  09/07/17  Yes Oretha Milch D, MD  levETIRAcetam (KEPPRA) 500 MG tablet Take 1 tablet (500 mg total) by mouth 2 (two) times daily. 09/07/17  Yes Oretha Milch D, MD  Lidocaine 4 % PTCH Apply 1 patch topically daily. To each shoulder.  Remove after 12 hours   Yes [provider]  loperamide (IMODIUM A-D) 2 MG tablet Take 2 mg by mouth as needed for diarrhea or loose stools.   Yes [provider]  Multiple Vitamin (MULTIVITAMIN WITH MINERALS) TABS tablet Take 1 tablet by mouth daily. 09/07/17  Yes Oretha Milch D, MD  Nutritional Supplements (RESOURCE 2.0) LIQD Take 90 mLs by mouth 3 (three) times daily between meals.   Yes [provider]  nystatin (MYCOSTATIN) 100000 UNIT/ML suspension Take 5 mLs (500,000 Units total) by mouth 4 (four) times daily. Patient taking differently: Take 5 mLs by mouth 2 (two) times daily.  11/10/17  Yes Sheikh, Omair Latif, DO  omeprazole (PRILOSEC) 20 MG capsule Take 20 mg by mouth daily.   Yes [provider]  oxybutynin (DITROPAN) 5 MG tablet Take 1 tablet (5 mg total) by mouth 3 (three) times daily. 09/07/17  Yes Oretha Milch D, MD  polyethylene glycol (MIRALAX / GLYCOLAX) packet Take 17 g by mouth daily.   Yes [provider]  Potassium Chloride ER 20 MEQ TBCR Take 20  mEq by mouth every other day. 09/20/17  Yes Patrecia Pour, MD  predniSONE (DELTASONE) 2.5 MG tablet Take 2.5 mg by mouth daily with breakfast.   Yes [provider]  propranolol (INDERAL) 10 MG tablet Take 10 mg by mouth 2 (two) times daily.   Yes [provider]  risperiDONE (RISPERDAL) 1 MG tablet Take 1 tablet (1 mg total) by mouth 2 (two) times daily. 09/07/17  Yes Oretha Milch D, MD  senna-docusate (SENOKOT-S) 8.6-50 MG tablet Take 1 tablet by mouth at bedtime as needed for mild constipation. Patient taking differently: Take 1 tablet by mouth at bedtime.  09/07/17  Yes Oretha Milch D, MD  umeclidinium bromide (INCRUSE ELLIPTA) 62.5 MCG/INH AEPB Inhale 1 puff into the lungs daily.   Yes [provider]  albuterol (PROVENTIL) (2.5 MG/3ML) 0.083% nebulizer solution Take 3 mLs (2.5 mg total) by nebulization every 4 (four) hours as needed for wheezing or shortness of breath. Patient not taking: Reported on 02/09/2018 11/10/17   Raiford Noble Latif, DO  budesonide-formoterol [facility]) 160-4.5 MCG/ACT inhaler Inhale 2 puffs into the lungs 2 (two) times daily. Patient not taking: Reported on 02/09/2018 09/07/17   Oretha Milch D, MD  esomeprazole (NEXIUM) 40 MG capsule Take 1 capsule (40 mg total) by mouth daily. Patient not taking: Reported on 02/09/2018 09/07/17   Oretha Milch D, MD  fluconazole (DIFLUCAN) 100 MG tablet Take 1 tablet (100 mg total) by mouth daily. Patient not taking: Reported on 02/09/2018 11/11/17   Raiford Noble Latif, DO  lidocaine (LIDODERM) 5 % Place 1 patch onto the skin daily. Remove & Discard patch within 12 hours or as directed by MD Patient not taking: Reported on 02/09/2018 11/11/17   Raiford Noble Latif, DO  loperamide (IMODIUM) 2 MG capsule Take 1 capsule (2 mg total) by mouth as needed for diarrhea or loose stools. Patient not taking: Reported on 02/09/2018 09/07/17   Desiree Hane, MD  Maltodextrin-Xanthan Gum (RESOURCE THICKENUP CLEAR)  POWD Take 120 g by mouth as needed. Patient not taking: Reported on 02/09/2018 11/10/17   Raiford Noble Latif, DO  nicotine (NICODERM CQ - DOSED IN MG/24 HOURS) 21 mg/24hr patch Place 1 patch (21 mg total) onto the skin daily. Patient not taking: Reported on 02/09/2018 10/15/17   Elgergawy, Silver Huguenin, MD  nutrition supplement, JUVEN, (JUVEN) PACK Take 1 packet by mouth 2 (two) times daily between meals. Patient not taking: Reported on 02/09/2018 11/04/17   Debbe Odea, MD  predniSONE (DELTASONE) 10 MG tablet Take 0.5 tablets (5 mg total) by mouth daily with breakfast. Patient not taking: Reported on 02/09/2018 10/18/17   Elgergawy, Silver Huguenin, MD     Critical care time: **

## 2018-02-19 NOTE — Progress Notes (Signed)
Patrick [place] has been on HFNC throughout the morning. This afternoon he went into afib and was hypoxic with SpO2 < 80% despite HFNC at FiO2 100%. He was started on an amiodarone infusion. He was started on Bipap for increased work of breathing but had discomfort with the mask and became lethargic so this was stopped. We had a discussion and Patrick Holt has opted for comfort measures with no escalation in treatment for the hypoxemia. He was transitioned to comfort care. His friend [name] was made aware and is on her way to the hospital at this time.

## 2018-02-19 NOTE — Progress Notes (Signed)
[facility]  Atrial fib with RVR Plan Amiodarone  Roselie Awkward, MD [place] [facility] Pager: [phone] Cell: [phone] If no response, call [phone]

## 2018-02-20 DIAGNOSIS — R6521 Severe sepsis with septic shock: Secondary | ICD-10-CM

## 2018-02-20 DIAGNOSIS — A419 Sepsis, unspecified organism: Secondary | ICD-10-CM

## 2018-02-20 MED ORDER — FENTANYL 2500MCG IN NS 250ML (10MCG/ML) PREMIX INFUSION
0.0000 ug/h | INTRAVENOUS | Status: DC
  Administered 2018-02-20: 25 ug/h via INTRAVENOUS
  Filled 2018-02-20: qty 250

## 2018-02-20 MED ORDER — SCOPOLAMINE 1 MG/3DAYS TD PT72
1.0000 | MEDICATED_PATCH | TRANSDERMAL | Status: DC
  Administered 2018-02-20: 1.5 mg via TRANSDERMAL
  Filled 2018-02-20: qty 1

## 2018-02-22 ENCOUNTER — Telehealth: Payer: Self-pay | Admitting: *Deleted

## 2018-02-22 NOTE — Telephone Encounter (Signed)
Received D/C from [facility] .D/C forwarded to Dr. Lake Bells to be signed.

## 2018-02-24 NOTE — Telephone Encounter (Signed)
Received signed D/C from provider- [facility] was notified for pick up.

## 2018-03-18 NOTE — Progress Notes (Signed)
   03/04/2018 0800  Clinical Encounter Type  Visited With Patient;Health care provider  Visit Type Initial;Patient actively dying;Social support  Referral From Physician  Spiritual Encounters  Spiritual Needs Emotional  Stress Factors  Patient Stress Factors Major life changes   Met w/ pt in room while RN tending to him.  Pt was kind in declining chaplain support.  RN reminded outside of rm that chaplain support is available if pt changes his mind.  Myra Gianotti resident, [phone]

## 2018-03-18 NOTE — Discharge Summary (Signed)
  Name: Patrick Holt MRN: [id number] DOB: 06-05-1950 68 y.o.  Date of Admission: 02/05/2018 11:09 PM Date of Discharge: 02/23/2018 Attending Physician: No att. providers found  Discharge Diagnosis: Active Problems:   Sepsis ([place])   Pressure injury of skin   Acute respiratory failure with hypoxemia (HCC)   Malnutrition of moderate degree   Endotracheal tube present   Fever   Hypotension   Pleural effusion   Septic shock ([place])  Cause of death: hypoxic respiratory failure  Time of death: 3:39   Disposition and follow-up:   Patrick Holt was discharged from [facility] in expired condition.    Hospital Course: 68 year old man presented 12/26 from a SNF with AMS found to be in septic shock secondary to pneumococcal pneumonia. Placed on BiPAP and Levophed and admit to the ICU, failed bipap and was transferred to the ICU intubated 12/27. His septic shock resolved and he completed a full course of vancomycin. He had volume overload and was treated with IV lasix but developed a rash so this was transitioned to bumex. Extubated 1/3 with weak swallow and severe risk of aspiration. Post extubation he had persistent hypoxia despite HFNC at FIO2 100% and opted for transition to comfort care, he became progressively less responsive with persistent SpO2 <80. Patrick Holt passed away 1/6 at 3:39 PM.    Signed: Ledell Noss, MD 02/23/2018, 11:44 AM

## 2018-03-18 NOTE — Progress Notes (Signed)
NAME:  Patrick Holt, MRN:  [id number], DOB:  26-Sep-1950, LOS: 69 ADMISSION DATE:  01/29/2018, CONSULTATION DATE:02/09/2018 REFERRING YD:XAJO Patrick Cancer, PA, CHIEF COMPLAINT:Altered mental status  Brief History   68 year old man presented from a SNF with AMS found to be in septic shock secondary to pneumococcal pneumonia. Placed on BiPAP and Levophed and admit to the ICU, failed bipap and was transferred to the ICU intubated 12/27. His septic shock resolved and he completed a full course of vancomycin. He had volume overload and was treated with IV lasix but developed a rash so this was transitioned to bumex. Extubated 1/3 with weak swallow and severe risk of aspiration. Post extubation he had persistent hypoxia despite HFNC and opted for transition to comfort care yesterday.  Past Medical History  Asthma, COPD, afib (on eliquis),T2DM, HTN, HLD, Dermatomyositis with esophogeal stricture, RA, sarcoidosis, CVA, subarachnoid hemorrhage, Diverticulosis, hiatal hernia, Depression, Anxiety, Bipolar 1  Significant Hospital Events   12/26 Admitted, Weaned off pressors 12/27 Transfer from ICU 12/30 Rash 1/3 extubated   Objective   Blood pressure 101/79, pulse 87, temperature 98.6 F (37 C), temperature source Oral, resp. rate 20, height 5\' 9"  (1.753 m), weight 85.6 kg, SpO2 (!) 64 %.    FiO2 (%):  [100 %] 100 %   Intake/Output Summary (Last 24 hours) at 02-28-2018 1059 Last data filed at Feb 28, 2018 1000 Gross per 24 hour  Intake 394.8 ml  Output 1350 ml  Net -955.2 ml   Filed Weights   02/17/18 0500 02/18/18 0422 02/19/18 0500  Weight: 85.8 kg 85.7 kg 85.6 kg    Examination: General: appears comfortable Lungs: normal work of breathing    Assessment & Plan:   Comfort care  - start scopolamine patch, continue  - continue fentanyl infusion - continue PRN benadryl, ativan, morphine IV and SL   - friend Patrick Holt was updated on the plan, [name] has opted to no longer  speak with his sister and brother and did not want them to be contacted  - will transfer to med surg today   Labs   CBC: Recent Labs  Lab 02/14/18 0433 02/15/18 0253  WBC 8.1 9.6  NEUTROABS 4.4 6.8  HGB 12.6* 13.2  HCT 39.2 41.8  MCV 92.9 93.9  PLT 165 878    Basic Metabolic Panel: Recent Labs  Lab 02/16/18 0359 02/16/18 1408 02/16/18 1640 02/17/18 0512 02/17/18 1649 02/18/18 0431 02/19/18 0310  NA  --  143  --  141 143 142 138  K  --  2.6*  --  3.3* 3.7 3.6 3.9  CL  --  96*  --  96* 100 101 102  CO2  --  37*  --  35* 35* 32 27  GLUCOSE  --  174*  --  107* 104* 73 84  BUN  --  11  --  11 9 9  5*  CREATININE  --  0.53*  --  0.52* 0.51* 0.48* 0.46*  CALCIUM  --  8.0*  --  8.3* 8.4* 8.7* 8.9  MG  --   --  1.5*  --  2.0 1.9  --   PHOS <1.0* 2.5  --  2.1* 3.9 3.8  --    GFR: Estimated Creatinine Clearance: 95.9 mL/min (A) (by C-G formula based on SCr of 0.46 mg/dL (L)). Recent Labs  Lab 02/14/18 0433 02/15/18 0253  WBC 8.1 9.6    Liver Function Tests: No results for input(s): AST, ALT, ALKPHOS, BILITOT, PROT, ALBUMIN in  the last 168 hours. No results for input(s): LIPASE, AMYLASE in the last 168 hours. No results for input(s): AMMONIA in the last 168 hours.  ABG    Component Value Date/Time   PHART 7.476 (H) 02/18/2018 0310   PCO2ART 52.7 (H) 02/18/2018 0310   PO2ART 64.0 (L) 02/18/2018 0310   HCO3 39.1 (H) 02/18/2018 0310   TCO2 41 (H) 02/18/2018 0310   ACIDBASEDEF 3.0 (H) 02/09/2018 0757   O2SAT 94.0 02/18/2018 0310     Coagulation Profile: No results for input(s): INR, PROTIME in the last 168 hours.  Cardiac Enzymes: No results for input(s): CKTOTAL, CKMB, CKMBINDEX, TROPONINI in the last 168 hours.  HbA1C: Hgb A1c MFr Bld  Date/Time Value Ref Range Status  11/01/2017 02:47 PM 5.4 4.8 - 5.6 % Final    Comment:    (NOTE)         Prediabetes: 5.7 - 6.4         Diabetes: >6.4         Glycemic control for adults with diabetes: <7.0   01/14/2017  06:14 AM 5.9 (H) 4.8 - 5.6 % Final    Comment:    (NOTE) Pre diabetes:          5.7%-6.4% Diabetes:              >6.4% Glycemic control for   <7.0% adults with diabetes     CBG: Recent Labs  Lab 02/18/18 2009 02/18/18 2350 02/19/18 0343 02/19/18 0803 02/19/18 1229  GLUCAP 86 87 88 91 95    Past Medical History  He,  has a past medical history of Anemia, Anxiety, Asthma, Atrial fibrillation ([place]) (01/2012), Bipolar 1 disorder ([employer]), Holt ([place]), Collagen vascular disease ([place]), Conversion disorder (06/2010), COPD (chronic obstructive pulmonary disease) ([place]), CVA (cerebral vascular accident) ([place]), Dermatomyositis ([place]), Dermatomyositis ([place]), Diverticulitis, Diverticulosis of colon, Dysrhythmia, Esophageal dysmotility, Esophageal stricture, Fibromyalgia, Gastritis, GERD (gastroesophageal reflux disease), Hand fracture, right, Headache(784.0), Hiatal hernia, History of narcotic addiction ([place]), adenomatous colonic polyps, Hyperlipidemia, Hypertension, Internal hemorrhoids, Ischemic heart disease, Major depression, Myocardial infarct ([place]), Narcotic dependence ([place]), Nephrolithiasis, Obesity, OCD (obsessive compulsive disorder), Otosclerosis, Paroxysmal A-fib ([place]), Peripheral neuropathy, Raynaud's disease, Renal insufficiency, Rheumatoid arthritis(714.0), Sarcoidosis, Seizures ([place]), Subarachnoid hemorrhage ([place]) (01/2012), Tremor, Type II diabetes mellitus ([place]), Urge incontinence, and Vertigo.   Surgical History    Past Surgical History:  Procedure Laterality Date  . BACK SURGERY    . CARDIAC CATHETERIZATION    . CARPAL TUNNEL RELEASE Bilateral   . CATARACT EXTRACTION W/ INTRAOCULAR LENS IMPLANT Left   . COLONOSCOPY N/A 09/27/2012   Procedure: COLONOSCOPY;  Surgeon: Lafayette Dragon, MD;  Location: [facility];  Service: Endoscopy;  Laterality: N/A;  . ESOPHAGOGASTRODUODENOSCOPY N/A 09/27/2012   Procedure: ESOPHAGOGASTRODUODENOSCOPY (EGD);  Surgeon: Lafayette Dragon, MD;  Location: [facility];  Service: Endoscopy;  Laterality: N/A;  . KNEE ARTHROSCOPY W/ MENISCAL REPAIR Left 2009  . LUMBAR DISC SURGERY    . LYMPH NODE DISSECTION Right 1970's   "neck; dr thought I had Hodgkins; turned out to be sarcoidosis" (11/15/2012)  . squamous papilloma   2010   removed by Dr. Constance Holster ENT, noted on tongue  . TONSILLECTOMY       Social History   reports that he quit smoking about 4 months ago. His smoking use included cigarettes. He has a 15.00 pack-year smoking history. He has never used smokeless tobacco. He reports that he does not drink alcohol or use drugs.   Family History   His  family history includes Alcohol abuse in his mother; Coronary artery disease in his father; Depression in his mother; Diabetes in his mother and another family member; Heart disease in his mother; Hepatitis in his brother; Stroke in his mother.   Allergies Allergies  Allergen Reactions  . Immune Globulins Other (See Comments)    Acute renal failure  . Rho (D) Immune Globulin     Acute renal failure Acute renal failure  . Ciprofloxacin Swelling  . Flagyl [Metronidazole] Swelling  . Sulfa Antibiotics Other (See Comments)    blisters  . Sulfasalazine     blisters blisters  . Vancomycin     Diffuse maculopapular rash when receiving both vanc/cefepime.  Timing unclear.    . Bupropion     Unknown  . Bupropion Hcl Other (See Comments)    Unknown  . Cefepime Rash    Diffuse maculopapular rash when receiving both vanc and cefepime, timing unclear.  Challenged with Cefepime again 01/2018 and developed full body rash.  Tolerated ceftriaxone and ceftazidime 01/2018.  . Clobetasol Other (See Comments)    Unknown Unknown  . Escitalopram Oxalate     Unknown  . Fluoxetine Hcl     Unknown  . Furosemide Rash    Entered in 2011. Re-trialed 2019, developed rash again.  . Meclizine Rash  . Paroxetine Other (See Comments)    Unknown  . Penicillins Other (See Comments)    Unknown reaction - Tolerated  ceftriaxone 01/2018 Has patient had a PCN reaction causing immediate rash, facial/tongue/throat swelling, SOB or lightheadedness with hypotension: unknown Has patient had a PCN reaction causing severe rash involving mucus membranes or skin necrosis: unknown Has patient had a PCN reaction that required hospitalization unknown Has patient had a PCN reaction occurring within the last 10 years: unknown If all of the above answers are "NO", then may proceed with Cephalospori  . Tacrolimus Other (See Comments)    Unknown  . Tetanus Toxoid Other (See Comments)    Unknown  . Tuberculin Purified Protein Derivative Other (See Comments)    Unknown     Home Medications  Prior to Admission medications   Medication Sig Start Date End Date Taking? Authorizing Provider  acetaminophen (TYLENOL) 325 MG tablet Take 2 tablets (650 mg total) by mouth every 6 (six) hours as needed for mild pain (or Fever >/= 101). 11/04/17  Yes Debbe Odea, MD  ALPRAZolam Duanne Moron) 0.5 MG tablet Take 1 tablet (0.5 mg total) by mouth 3 (three) times daily. Patient taking differently: Take 0.5 mg by mouth 2 (two) times daily.  10/14/17  Yes Elgergawy, Silver Huguenin, MD  amiodarone (PACERONE) 400 MG tablet Take 1 tablet (400 mg total) by mouth daily. 11/11/17  Yes Sheikh, Omair Latif, DO  apixaban (ELIQUIS) 5 MG TABS tablet Take 10 mg oral twice daily for total of  days, then transition to 5 mg oral twice daily on 10/21/2017 Patient taking differently: Take 5 mg by mouth 2 (two) times daily.  10/18/17  Yes Elgergawy, Silver Huguenin, MD  atorvastatin (LIPITOR) 40 MG tablet Take 1 tablet (40 mg total) by mouth daily. 09/07/17  Yes Oretha Milch D, MD  benzonatate (TESSALON) 100 MG capsule Take 1 capsule (100 mg total) by mouth 3 (three) times daily. 11/10/17  Yes Sheikh, Omair Latif, DO  carbidopa-levodopa (SINEMET IR) 25-100 MG tablet Take 1 tablet by mouth 2 (two) times daily.   Yes [provider]  divalproex (DEPAKOTE) 250 MG DR tablet  Take 1 tablet (250 mg total)  by mouth 3 (three) times daily. 09/07/17  Yes Oretha Milch D, MD  ergocalciferol (VITAMIN D2) 50000 units capsule Take 1 capsule (50,000 Units total) by mouth once a week. 09/07/17  Yes Oretha Milch D, MD  guaiFENesin (MUCINEX) 600 MG 12 hr tablet Take 2 tablets (1,200 mg total) by mouth 2 (two) times daily. 10/18/17  Yes Elgergawy, Silver Huguenin, MD  ipratropium-albuterol (DUONEB) 0.5-2.5 (3) MG/3ML SOLN Inhale 3 mLs into the lungs every 6 (six) hours as needed. Patient taking differently: Inhale 3 mLs into the lungs every 6 (six) hours as needed (wheezing).  09/07/17  Yes Oretha Milch D, MD  levETIRAcetam (KEPPRA) 500 MG tablet Take 1 tablet (500 mg total) by mouth 2 (two) times daily. 09/07/17  Yes Oretha Milch D, MD  Lidocaine 4 % PTCH Apply 1 patch topically daily. To each shoulder.  Remove after 12 hours   Yes [provider]  loperamide (IMODIUM A-D) 2 MG tablet Take 2 mg by mouth as needed for diarrhea or loose stools.   Yes [provider]  Multiple Vitamin (MULTIVITAMIN WITH MINERALS) TABS tablet Take 1 tablet by mouth daily. 09/07/17  Yes Oretha Milch D, MD  Nutritional Supplements (RESOURCE 2.0) LIQD Take 90 mLs by mouth 3 (three) times daily between meals.   Yes [provider]  nystatin (MYCOSTATIN) 100000 UNIT/ML suspension Take 5 mLs (500,000 Units total) by mouth 4 (four) times daily. Patient taking differently: Take 5 mLs by mouth 2 (two) times daily.  11/10/17  Yes Sheikh, Omair Latif, DO  omeprazole (PRILOSEC) 20 MG capsule Take 20 mg by mouth daily.   Yes [provider]  oxybutynin (DITROPAN) 5 MG tablet Take 1 tablet (5 mg total) by mouth 3 (three) times daily. 09/07/17  Yes Oretha Milch D, MD  polyethylene glycol (MIRALAX / GLYCOLAX) packet Take 17 g by mouth daily.   Yes [provider]  Potassium Chloride ER 20 MEQ TBCR Take 20 mEq by mouth every other day. 09/20/17  Yes Patrecia Pour, MD  predniSONE  (DELTASONE) 2.5 MG tablet Take 2.5 mg by mouth daily with breakfast.   Yes [provider]  propranolol (INDERAL) 10 MG tablet Take 10 mg by mouth 2 (two) times daily.   Yes [provider]  risperiDONE (RISPERDAL) 1 MG tablet Take 1 tablet (1 mg total) by mouth 2 (two) times daily. 09/07/17  Yes Oretha Milch D, MD  senna-docusate (SENOKOT-S) 8.6-50 MG tablet Take 1 tablet by mouth at bedtime as needed for mild constipation. Patient taking differently: Take 1 tablet by mouth at bedtime.  09/07/17  Yes Oretha Milch D, MD  umeclidinium bromide (INCRUSE ELLIPTA) 62.5 MCG/INH AEPB Inhale 1 puff into the lungs daily.   Yes [provider]  albuterol (PROVENTIL) (2.5 MG/3ML) 0.083% nebulizer solution Take 3 mLs (2.5 mg total) by nebulization every 4 (four) hours as needed for wheezing or shortness of breath. Patient not taking: Reported on 02/09/2018 11/10/17   Raiford Noble Latif, DO  budesonide-formoterol [facility]) 160-4.5 MCG/ACT inhaler Inhale 2 puffs into the lungs 2 (two) times daily. Patient not taking: Reported on 02/09/2018 09/07/17   Oretha Milch D, MD  esomeprazole (NEXIUM) 40 MG capsule Take 1 capsule (40 mg total) by mouth daily. Patient not taking: Reported on 02/09/2018 09/07/17   Oretha Milch D, MD  fluconazole (DIFLUCAN) 100 MG tablet Take 1 tablet (100 mg total) by mouth daily. Patient not taking: Reported on 02/09/2018 11/11/17   Raiford Noble Latif, DO  lidocaine (  LIDODERM) 5 % Place 1 patch onto the skin daily. Remove & Discard patch within 12 hours or as directed by MD Patient not taking: Reported on 02/09/2018 11/11/17   Raiford Noble Latif, DO  loperamide (IMODIUM) 2 MG capsule Take 1 capsule (2 mg total) by mouth as needed for diarrhea or loose stools. Patient not taking: Reported on 02/09/2018 09/07/17   Desiree Hane, MD  Maltodextrin-Xanthan Gum (RESOURCE THICKENUP CLEAR) POWD Take 120 g by mouth as needed. Patient not taking: Reported on  02/09/2018 11/10/17   Raiford Noble Latif, DO  nicotine (NICODERM CQ - DOSED IN MG/24 HOURS) 21 mg/24hr patch Place 1 patch (21 mg total) onto the skin daily. Patient not taking: Reported on 02/09/2018 10/15/17   Elgergawy, Silver Huguenin, MD  nutrition supplement, JUVEN, (JUVEN) PACK Take 1 packet by mouth 2 (two) times daily between meals. Patient not taking: Reported on 02/09/2018 11/04/17   Debbe Odea, MD  predniSONE (DELTASONE) 10 MG tablet Take 0.5 tablets (5 mg total) by mouth daily with breakfast. Patient not taking: Reported on 02/09/2018 10/18/17   Elgergawy, Silver Huguenin, MD     Critical care time: **

## 2018-03-18 NOTE — Progress Notes (Signed)
Called patients POA to alert his of decline in patient status as he and she had requested.

## 2018-03-18 NOTE — Progress Notes (Signed)
PATIENT DEATH NOTE  Patient asystole on monitor. Absent heart and breath sounds. Pupils fixed and dilated. Verified by Heide Guile, RN.  POA called MD notified. CDS Called.

## 2018-03-18 NOTE — Progress Notes (Signed)
CSW aware that pt has not been switched to comfort care at this time. CSW has received no further calls from pt's brother regarding reaching ou tot the unit for pt. CSW will continue to follow to offer support for those involved at this time.      Patrick Holt Patrick Holt, MSW, [facility] Clinical Social Worker [phone]

## 2018-03-18 NOTE — Progress Notes (Signed)
POA Patrick Holt was given his key from SNF that was at hospital.

## 2018-03-18 NOTE — Progress Notes (Signed)
SLP Cancellation Note  Patient Details Name: Patrick Holt MRN: [id number] DOB: 1950-11-27   Cancelled treatment:       Reason Eval/Treat Not Completed: Other (comment) SLP following for completion of instrumental swallow study; however, per review of chart, pt has now transitioned to full comfort. Swallow study not indicated at this time. SLP to sign off - please reorder if we can assist with comfort feeding.   Germain Osgood 03/10/2018, 9:00 AM  Germain Osgood, M.A. [place] [facility] Environmental education officer [phone] Office [phone]

## 2018-03-18 NOTE — Progress Notes (Signed)
eLink Physician-Brief Progress Note Patient Name: Patrick Holt DOB: March 13, 1950 MRN: [id number]   Date of Service  03/20/2018  HPI/Events of Note  Patient is comfort care. Sat = 65%  eICU Interventions  Will order a Fentanyl IV infusion. Titrate for comfort.      Intervention Category Major Interventions: Other:  Sommer,Souleymane Cornelia Copa 2018/03/20, 3:20 AM

## 2018-03-18 DEATH — deceased

## 2018-04-19 ENCOUNTER — Ambulatory Visit: Payer: Medicare Other | Admitting: Podiatry

## 2018-04-25 IMAGING — DX DG HAND COMPLETE 3+V*R*
3 series · 3 of 3 positions shown · non-contrast
Comparison: Right wrist series from today reported separately.

CLINICAL DATA: 65-year-old male status post multiple falls at home.
Pain. Initial encounter.

EXAM:
RIGHT HAND - COMPLETE 3+ VIEW

[hand pa]
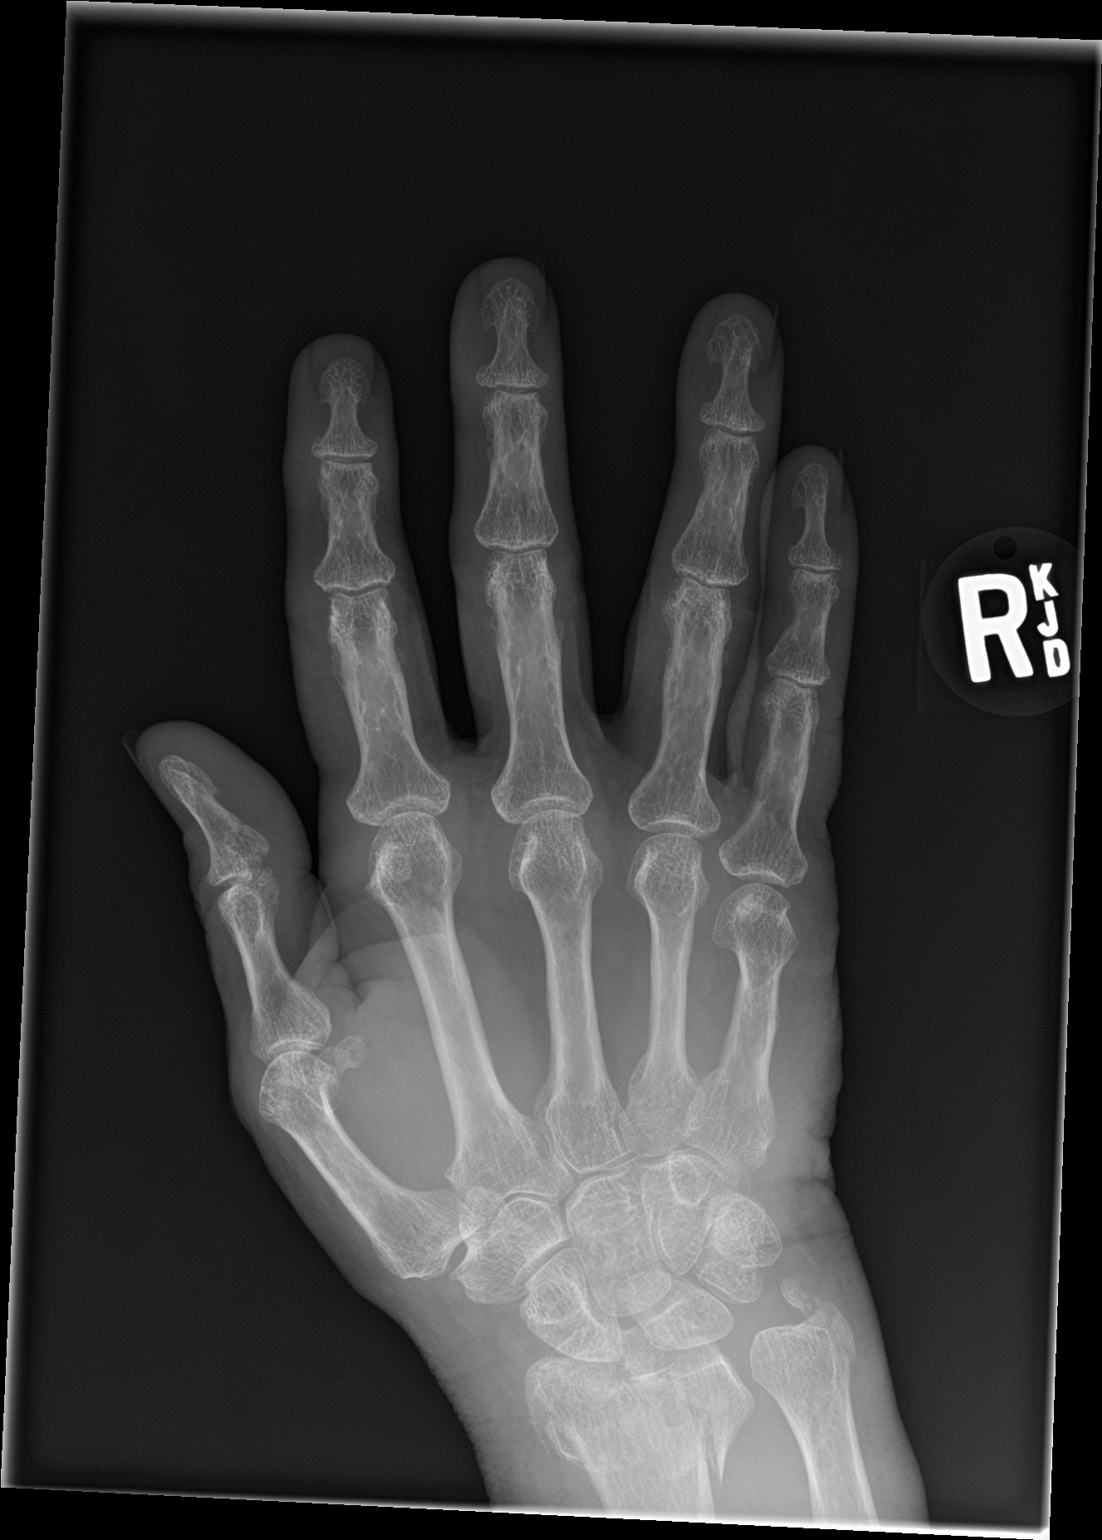

[hand obl]
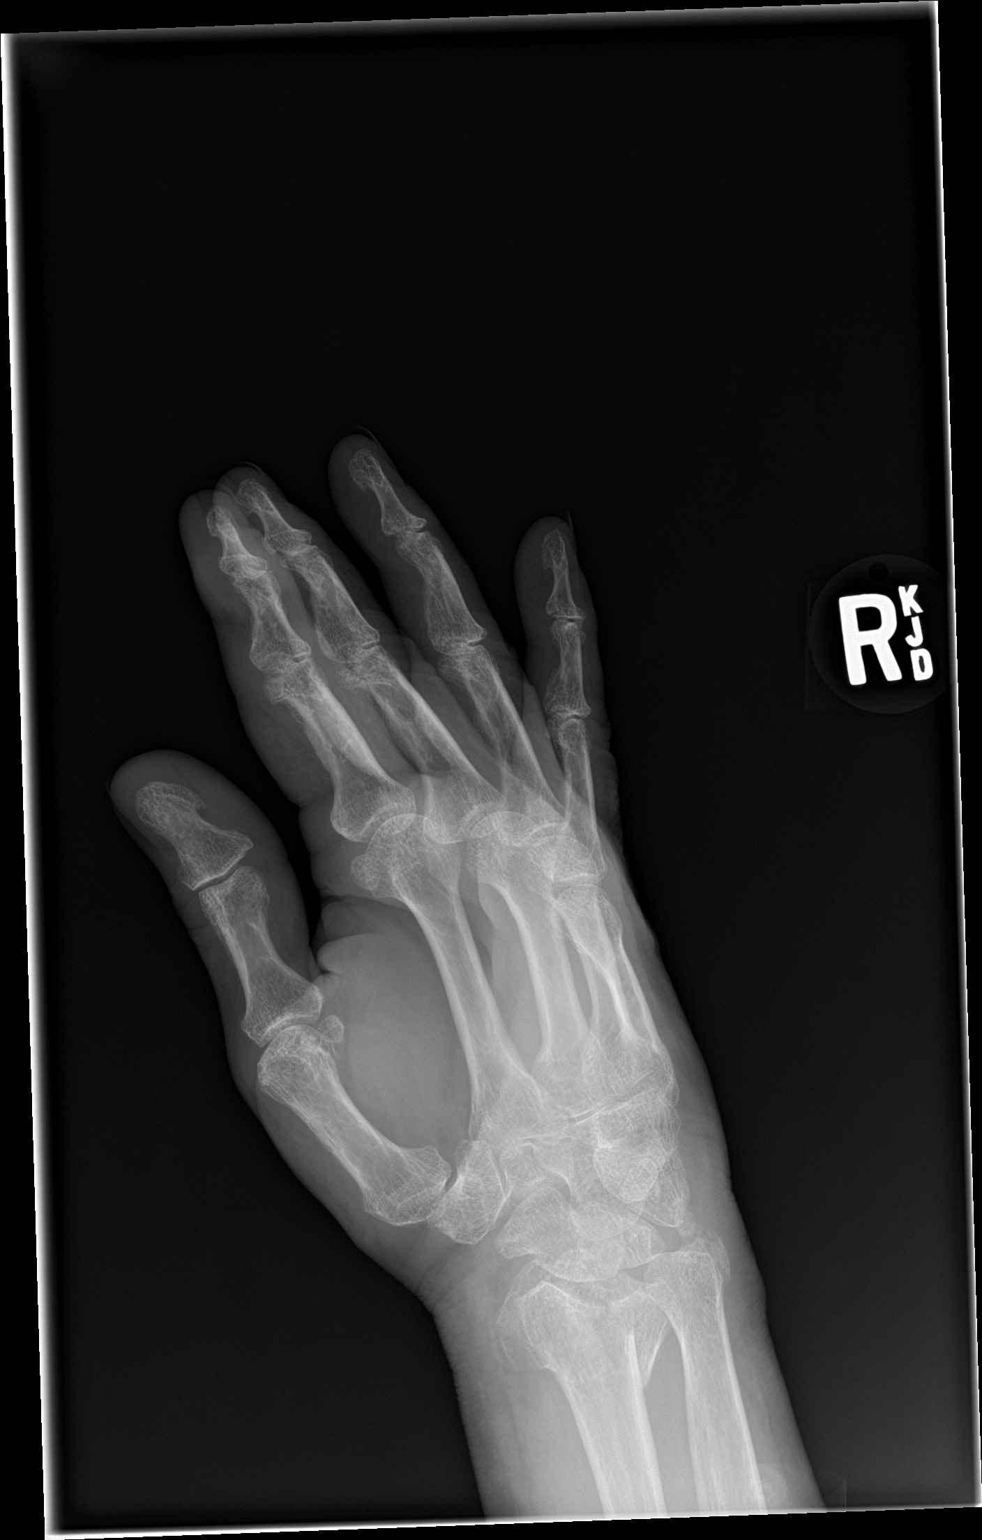

[hand lat]
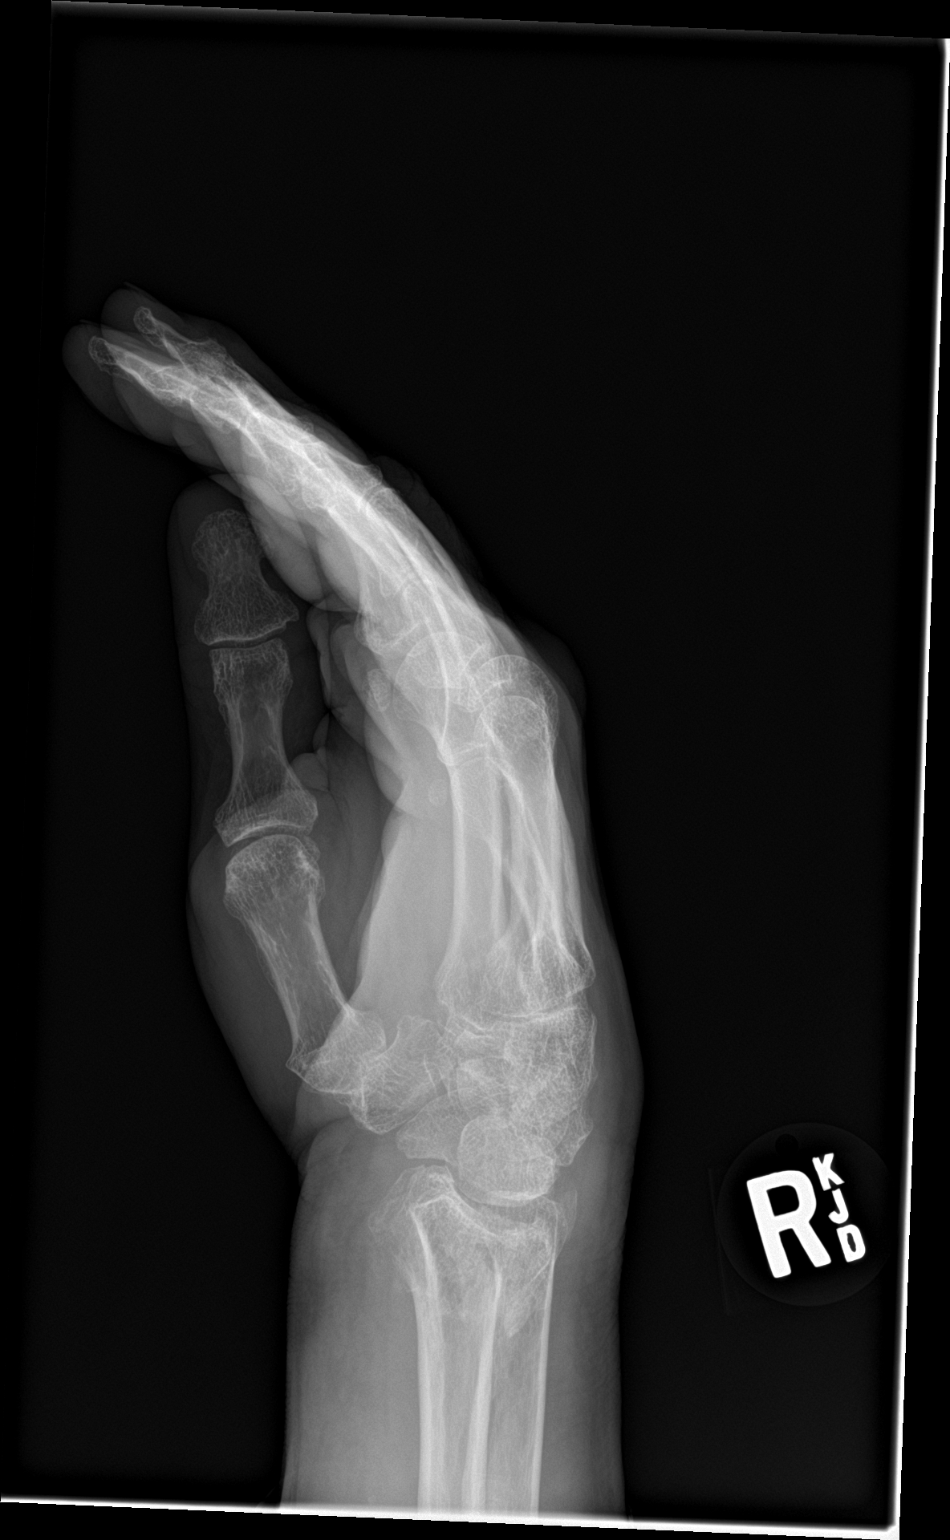

[3 of 3 positions shown; findings below may reference images not displayed]

FINDINGS: See right wrist series from today reported separately regarding
distal radius, ulna, and carpal bone injuries.

Foreshortening of the right fifth metacarpal where there may be an
underlying healed chronic fracture. No acute metacarpal fracture. No
acute phalanx fracture. Distal joint spaces are within normal limits
for age.
IMPRESSION: 1. See right wrist series from today reported separately.
2. No superimposed acute fracture or dislocation identified in the
right hand.

## 2018-04-25 IMAGING — DX DG CHEST 2V
2 series · 2 of 2 positions shown · non-contrast
Comparison: PA and lateral chest 10/16/2012, 12/30/2012 07/31/2014
and 12/30/2014. CT chest 07/31/2014.

CLINICAL DATA: Status post fall x3 today.

EXAM:
CHEST  2 VIEW

[chest lat]
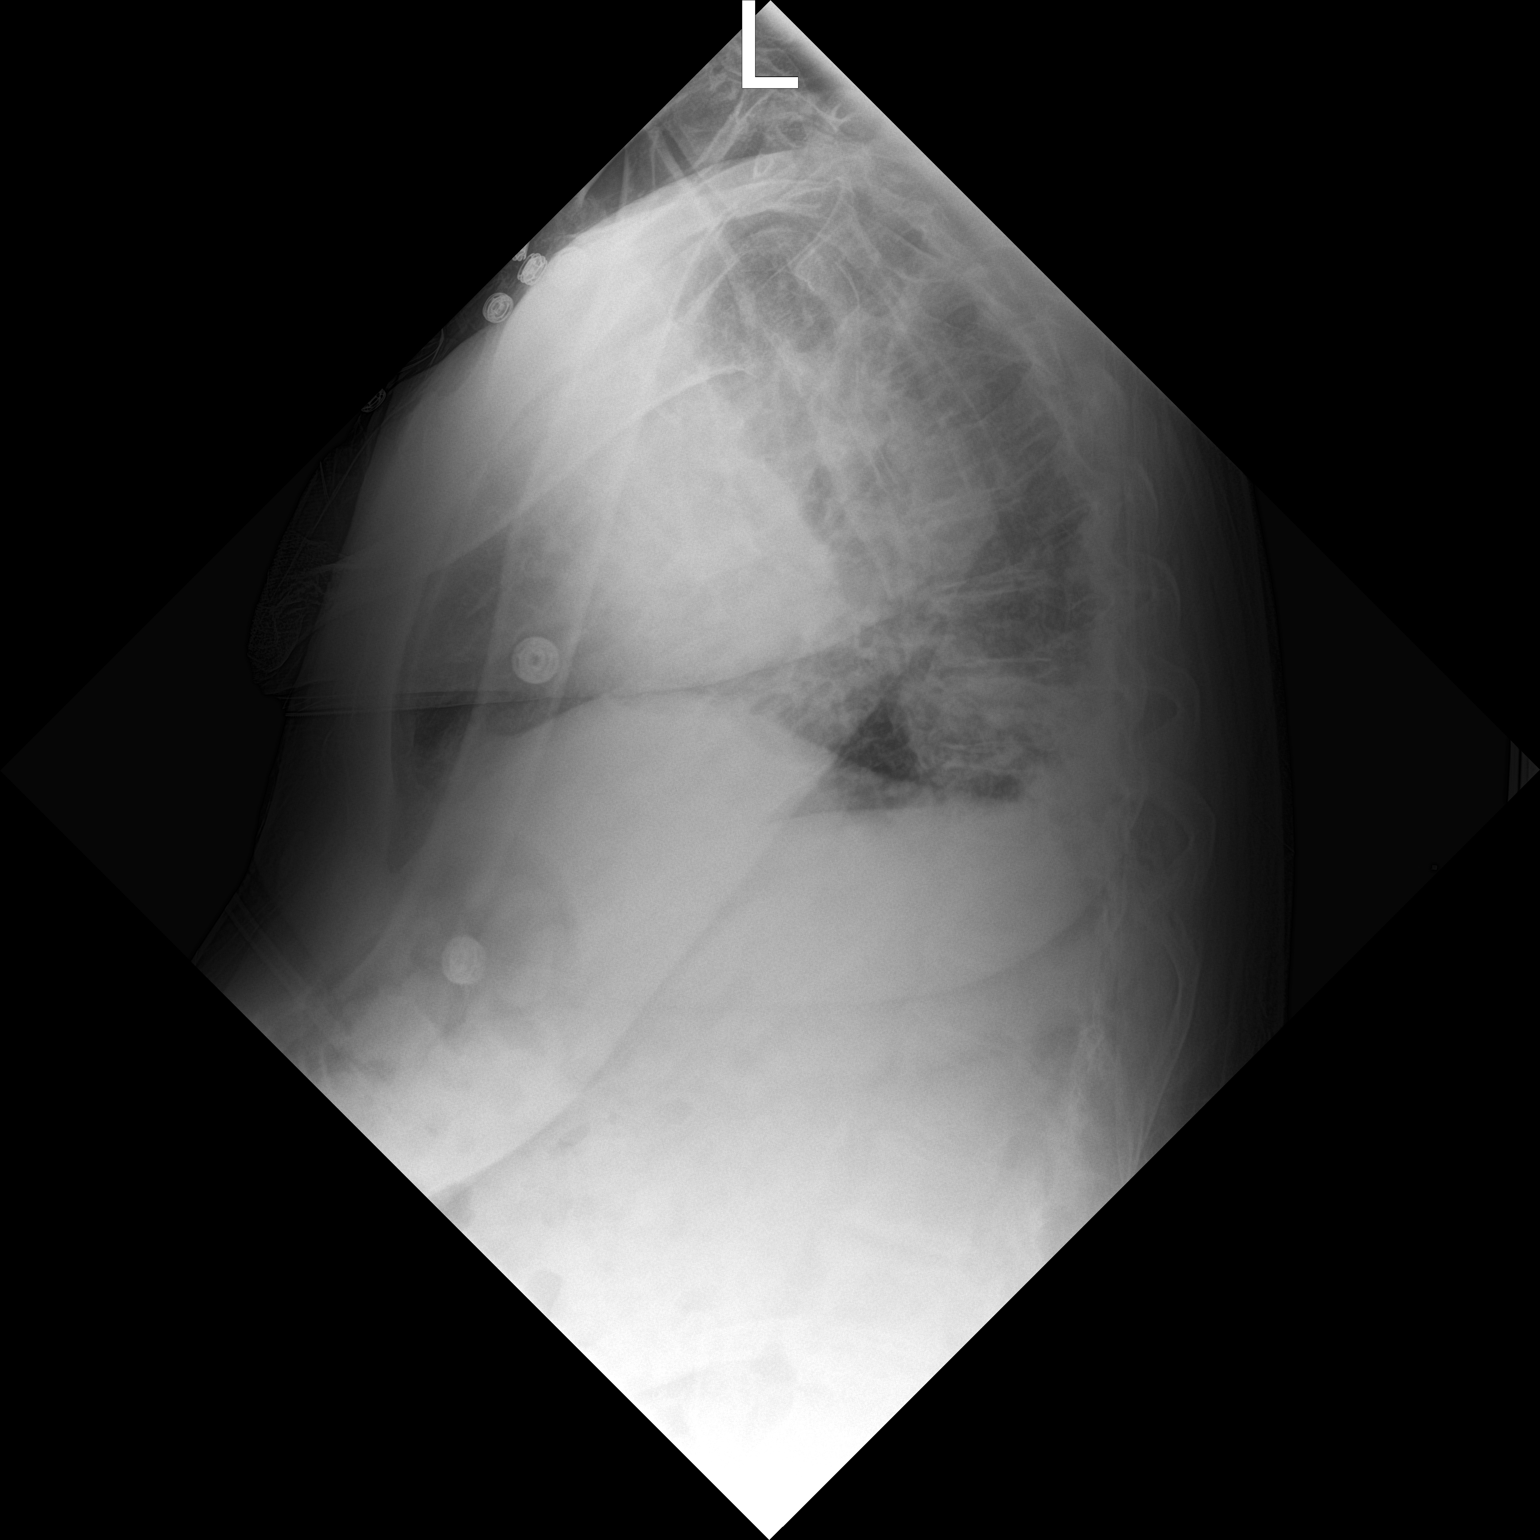

[chest ap]
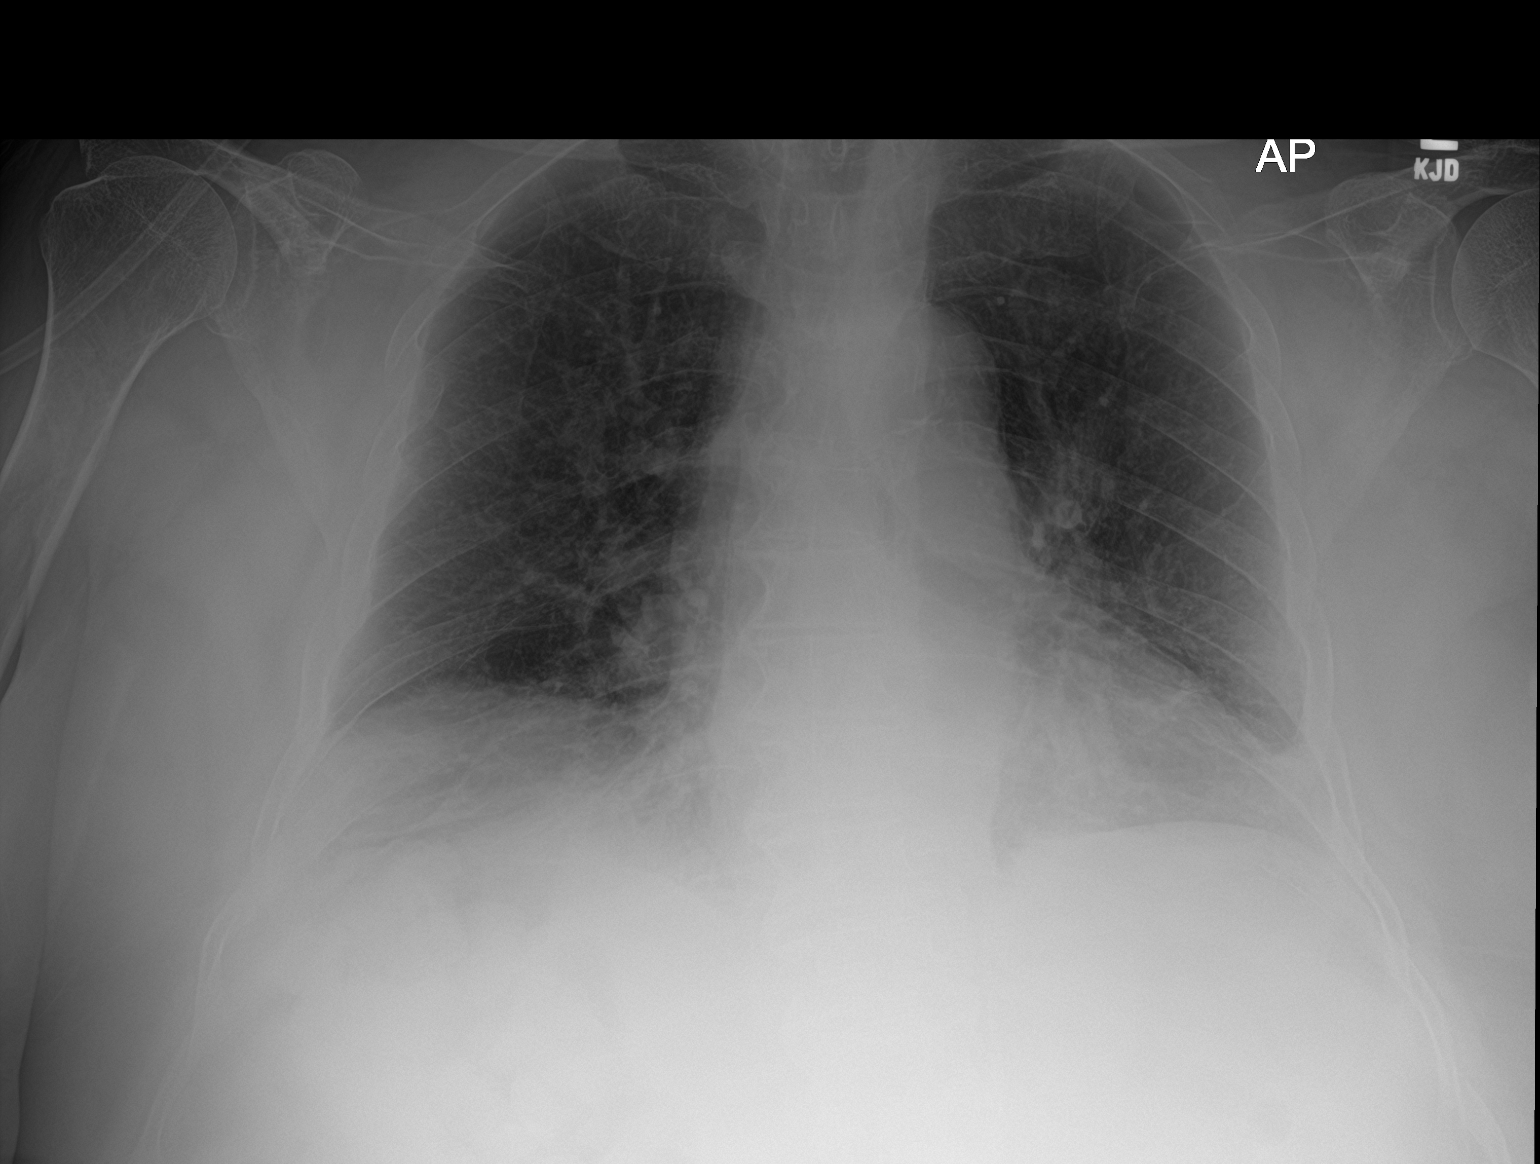

[2 of 2 positions shown; findings below may reference images not displayed]

FINDINGS: Asymmetric elevation of the right hemidiaphragm is seen on prior
exams. The lungs are clear. Heart size is upper normal. No
pneumothorax or pleural effusion. Aortic atherosclerosis noted.
IMPRESSION: No acute disease.

## 2018-04-25 IMAGING — DX DG WRIST COMPLETE 3+V*R*
3 series · 3 of 3 positions shown · non-contrast
Comparison: None.

CLINICAL DATA: 65-year-old male status post multiple falls at home.
Pain. Initial encounter.

EXAM:
RIGHT WRIST - COMPLETE 3+ VIEW

[wrist pa]
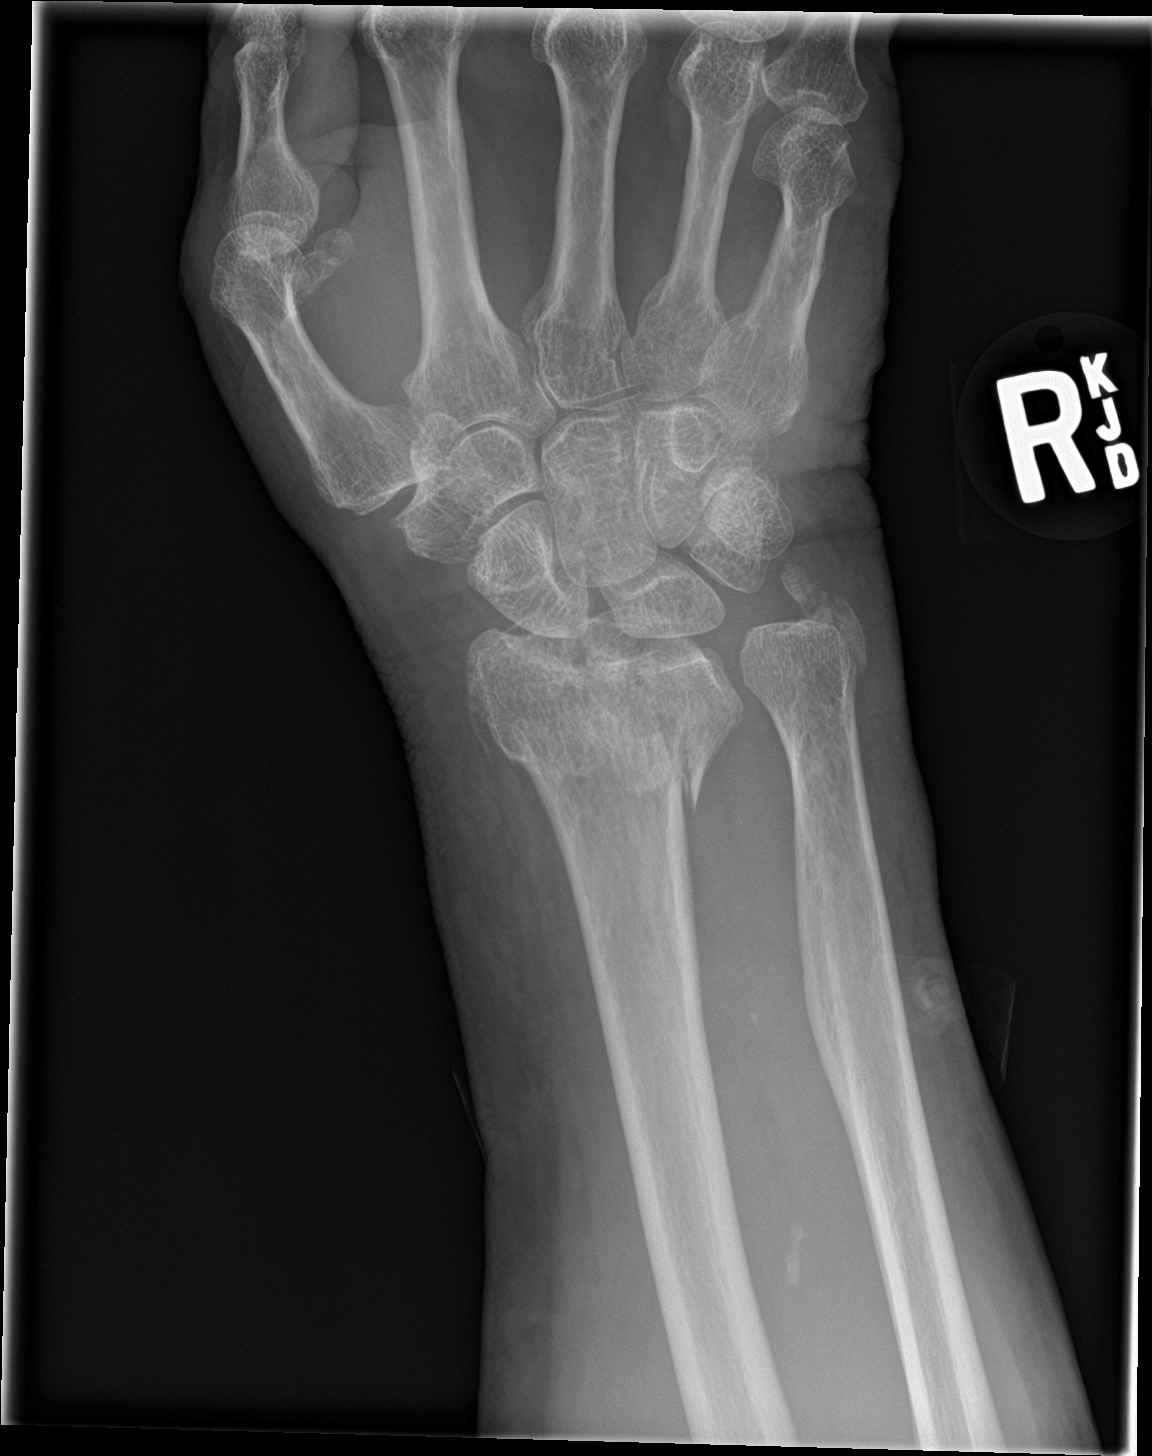

[wrist obl]
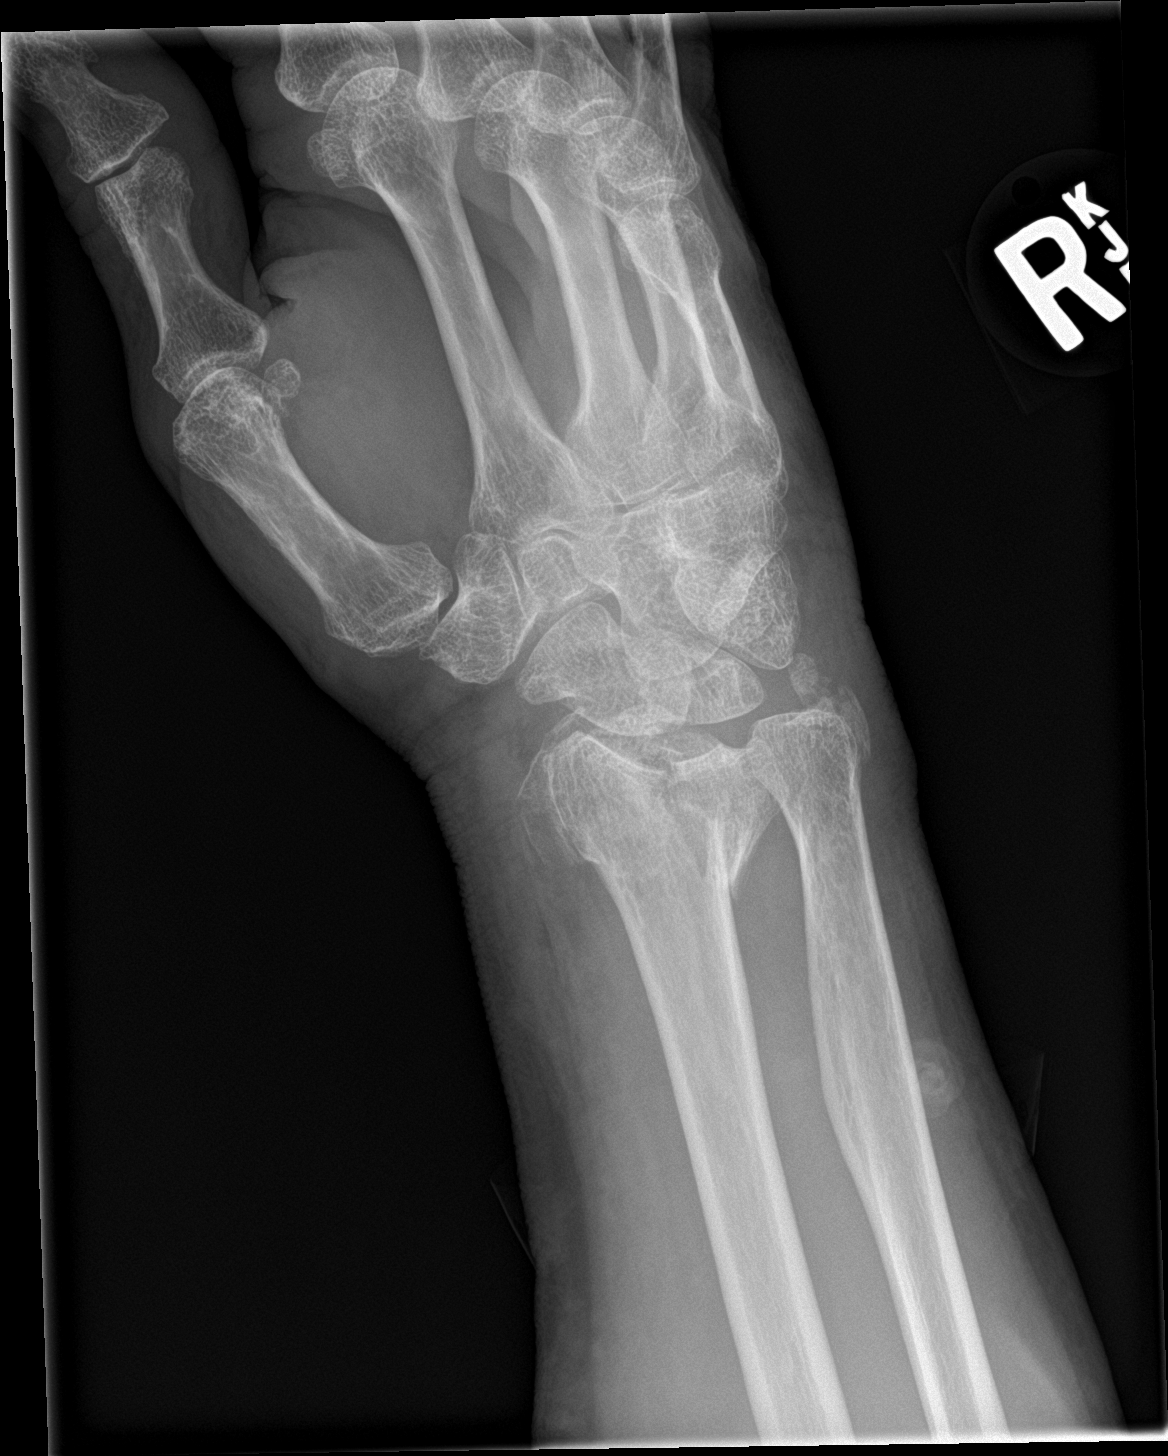

[wrist lat]
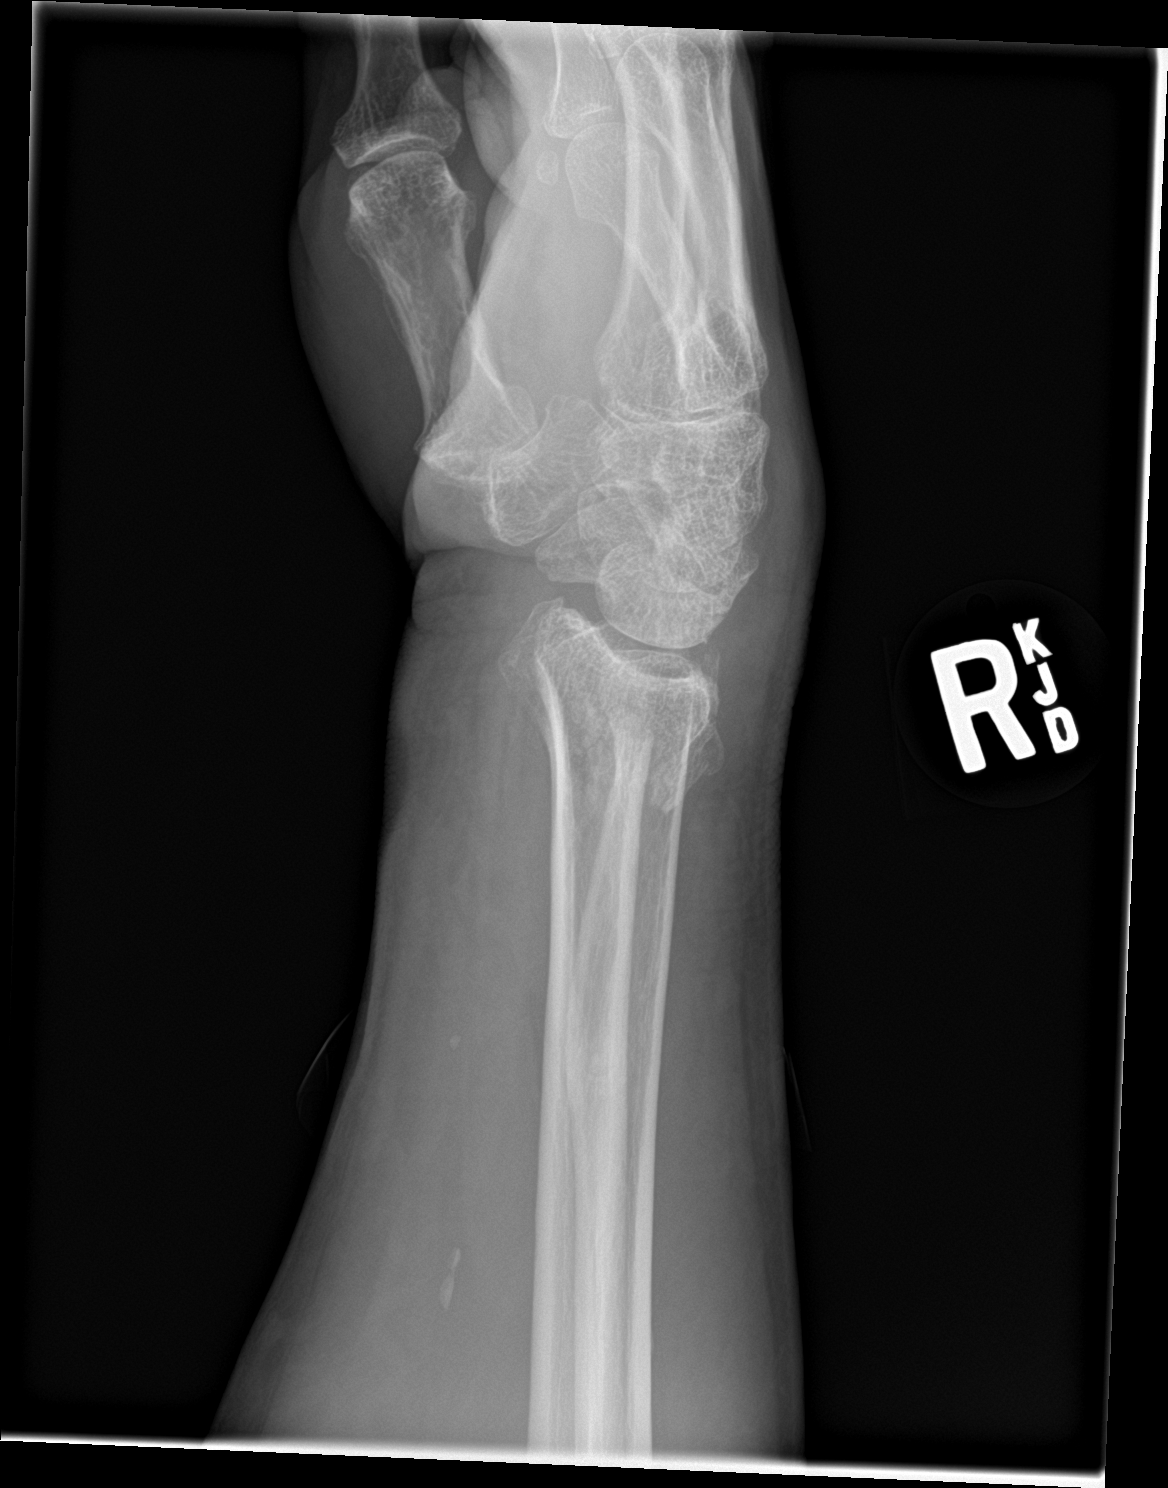

[3 of 3 positions shown; findings below may reference images not displayed]

FINDINGS: Comminuted and dorsally impacted fracture of the distal right radius
with radiocarpal joint and KAKI involvement. Comminuted mildly
displaced fracture of the ulnar styloid.

Surrounding soft tissue swelling. Mild widening of the scapholunate
interval, but otherwise carpal bone alignment appears preserved.
Visible metacarpals appear intact, there is foreshortening of the
fifth metacarpal. Calcified peripheral vascular disease.
IMPRESSION: 1. Comminuted and dorsally impacted distal right radius fracture
with radiocarpal and probably also KAKI involvement.
2. Comminuted mildly displaced fracture of the right ulnar styloid.
3. Widening of the scapholunate interval suspicious for scapholunate
ligament injury.

## 2018-05-11 IMAGING — DX DG CHEST 1V PORT
1 series · 1 of 1 positions shown · non-contrast
Comparison: Chest x-ray 11/02/2015

CLINICAL DATA: Altered mental status. History of hypertension and
ischemic heart disease.

EXAM:
PORTABLE CHEST 1 VIEW

[chest ap]
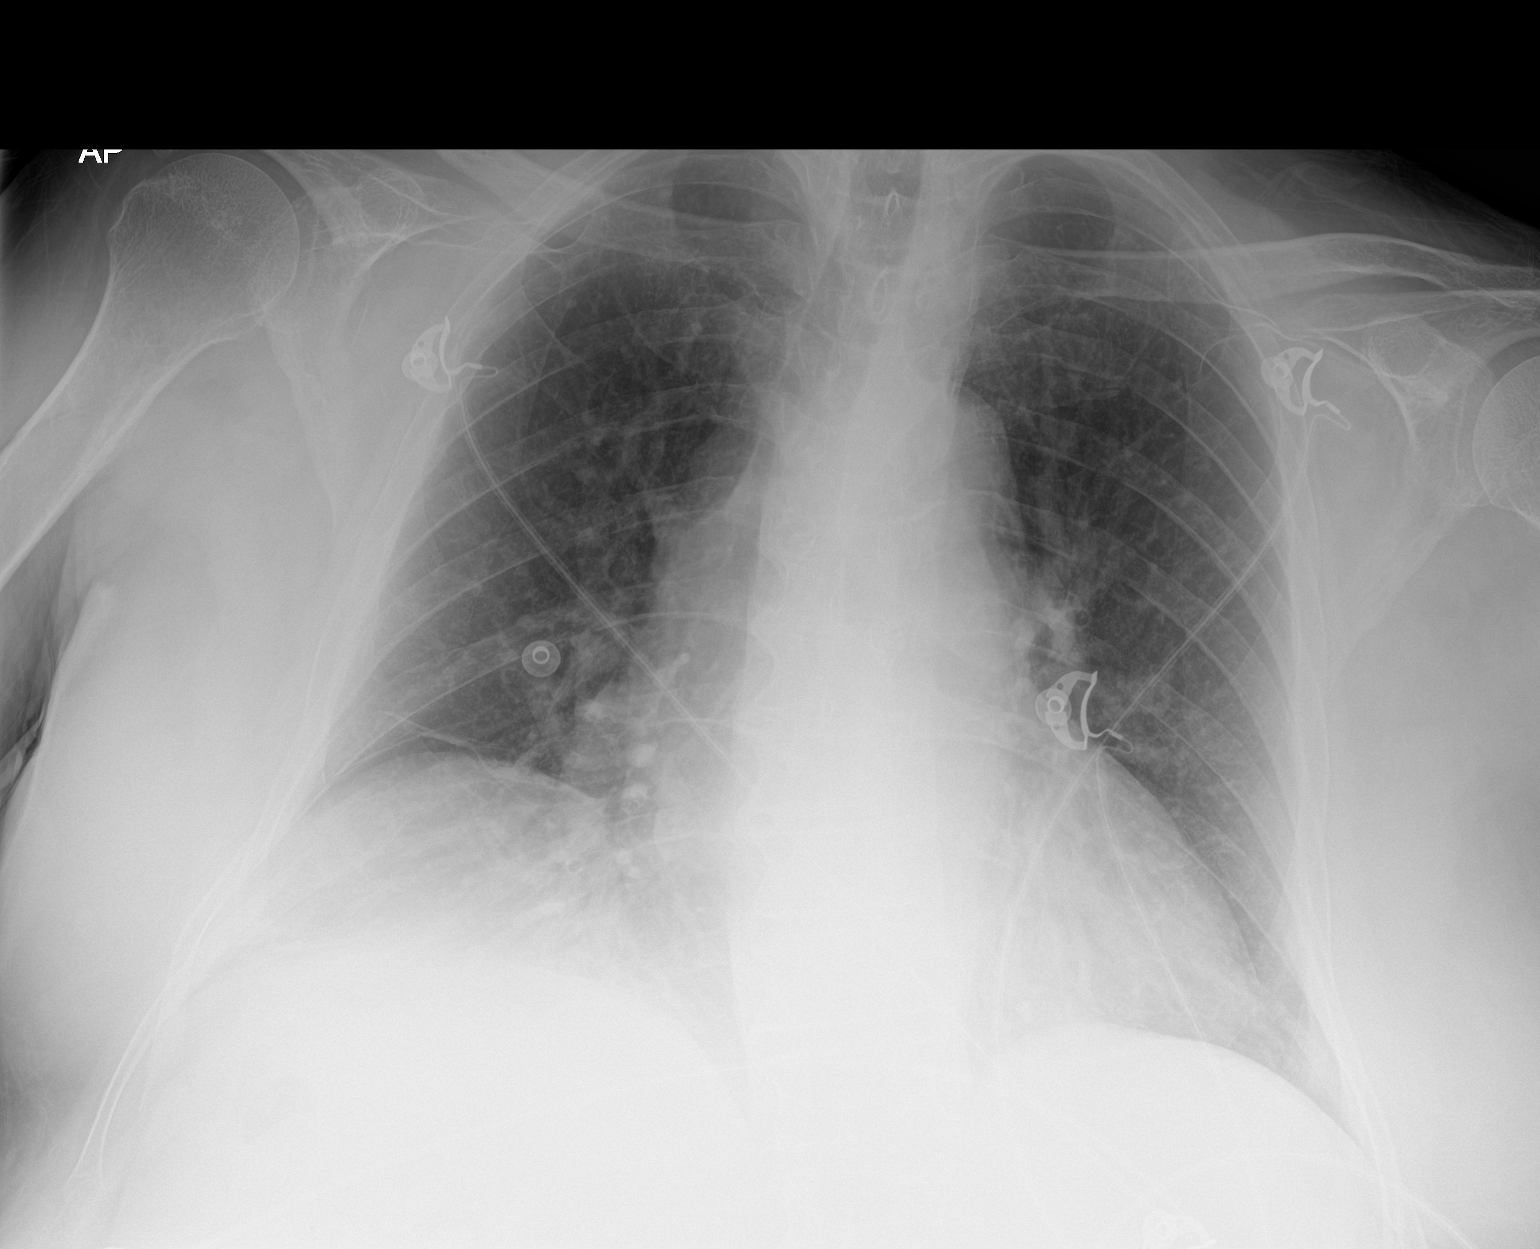

[1 of 1 positions shown; findings below may reference images not displayed]

FINDINGS: The heart is enlarged but stable. Mild tortuosity and calcification
of the thoracic aorta is stable. Stable eventration of the right
hemidiaphragm with overlying vascular crowding and atelectasis. No
infiltrates, edema or effusions.
IMPRESSION: Stable cardiac enlargement and elevation of the right hemidiaphragm.
No infiltrates or effusions.

## 2018-05-11 IMAGING — CT CT HEAD W/O CM
3 of 4 series · 15 of 47 positions shown, 18 images · non-contrast
Comparison: 10/29/2015 CT

CLINICAL DATA: Auditory hallucinations, flight of ideas and
agitation.

EXAM:
CT HEAD WITHOUT CONTRAST
TECHNIQUE: Contiguous axial images were obtained from the base of the skull
through the vertex without intravenous contrast.

[Series 2: head w/o · axial · non-contrast · 0.48mm/px · z∈[+1542,+1677]mm · 9 of 33 slices shown, 12 images]
[im 3/33  brain]
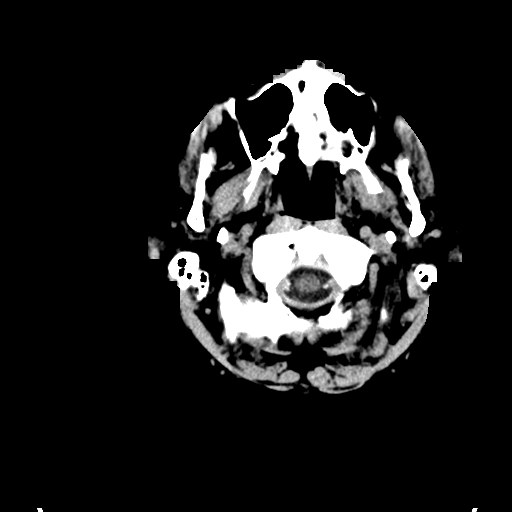
[im 3/33  bone]
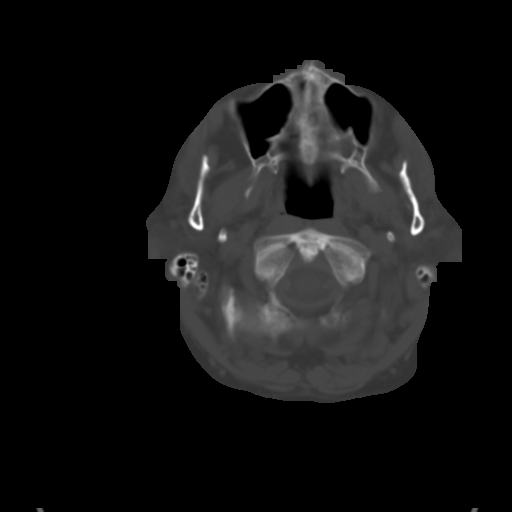
[im 7/33  brain]
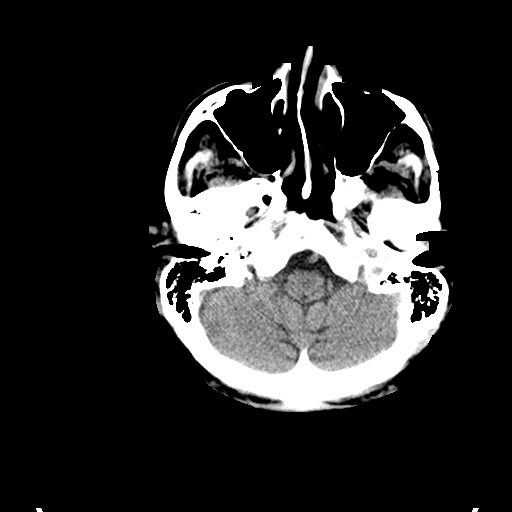
[im 10/33  brain]
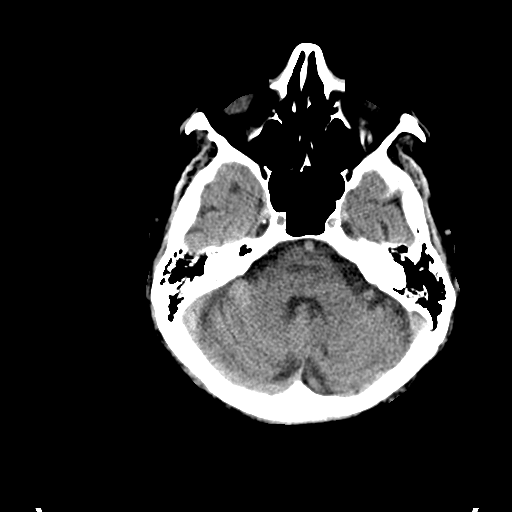
[im 14/33  brain]
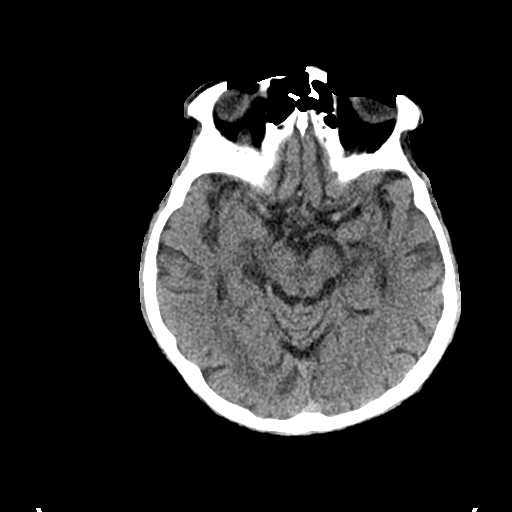
[im 17/33  brain]
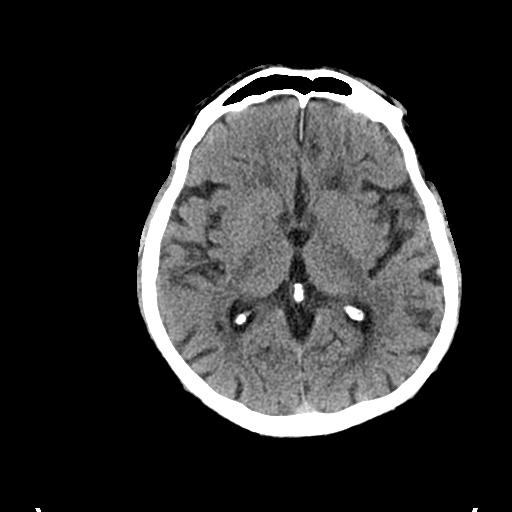
[im 17/33  bone]
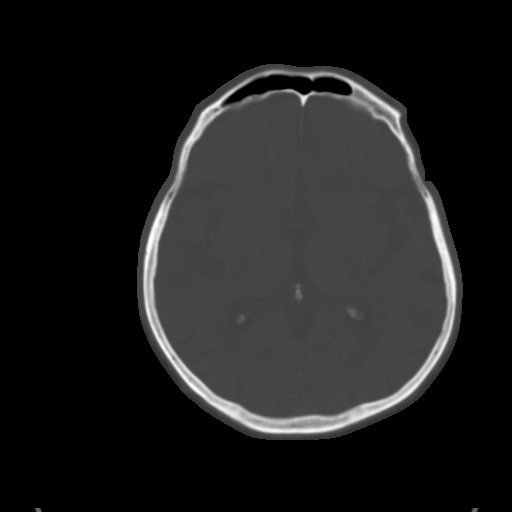
[im 19/33  brain]
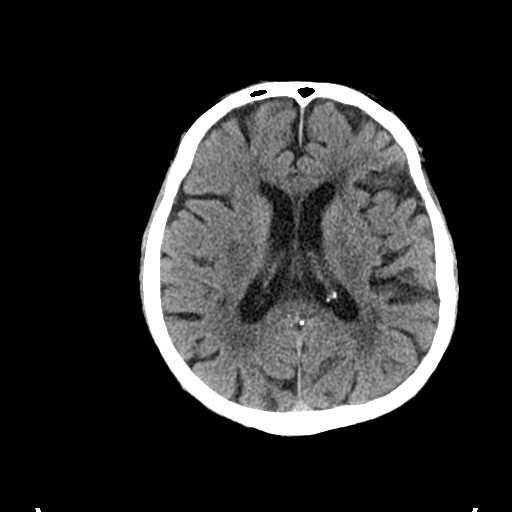
[im 23/33  brain]
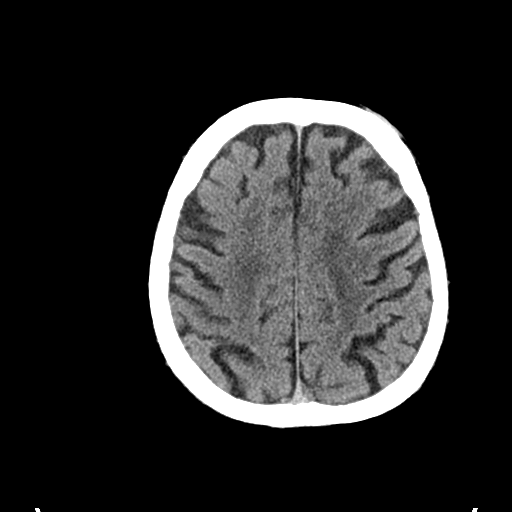
[im 26/33  brain]
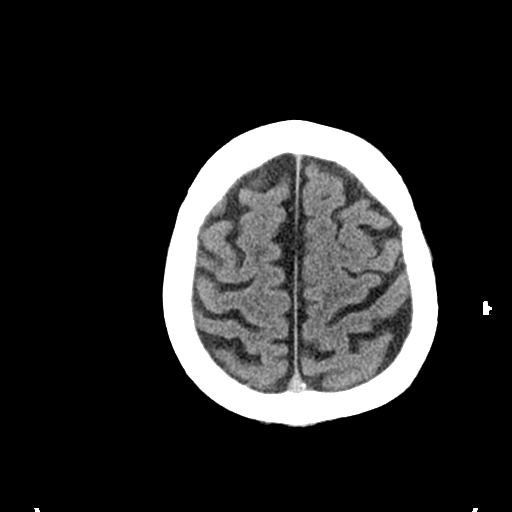
[im 30/33  brain]
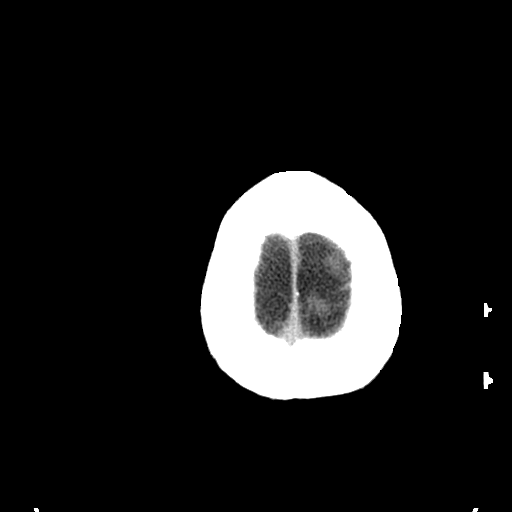
[im 30/33  bone]
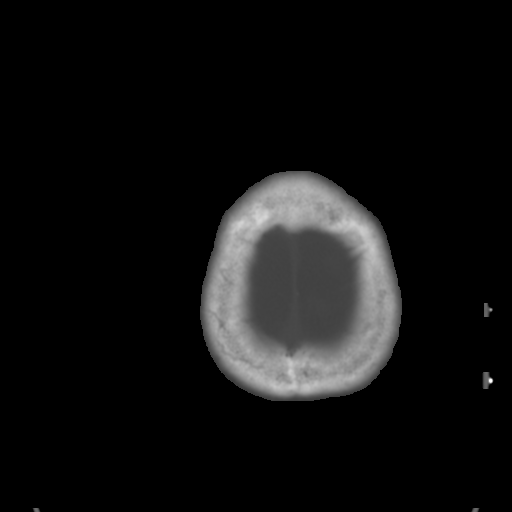

[Series 4: coronal · coronal · 0.37mm/px · 3 of 63 slices shown]
[im 21/63  brain]
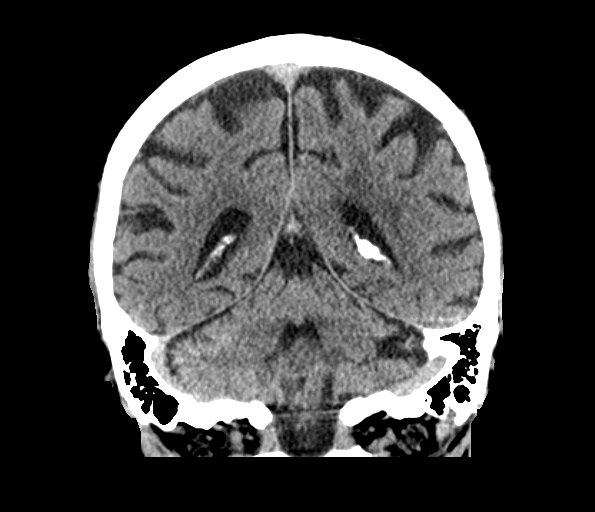
[im 28/63  brain]
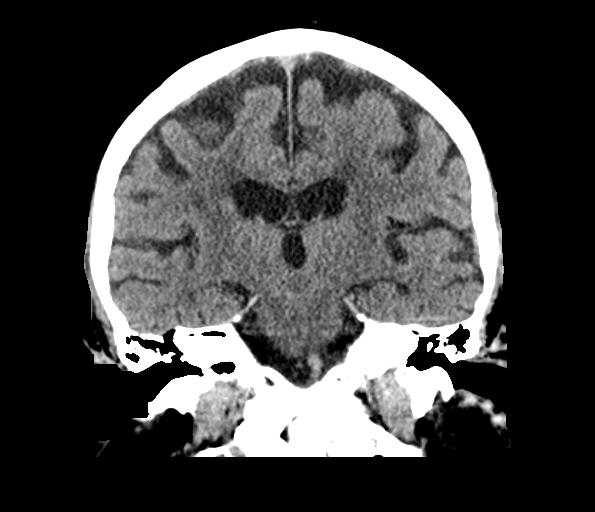
[im 35/63  brain]
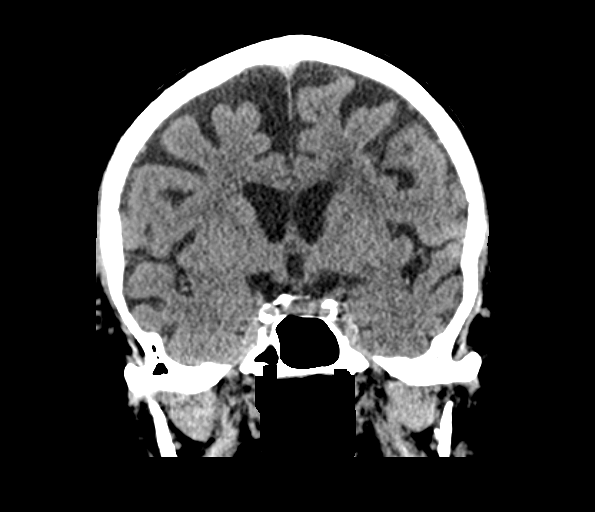

[Series 5: sagittal · sagittal · 0.37mm/px · 3 of 53 slices shown]
[im 18/53  brain]
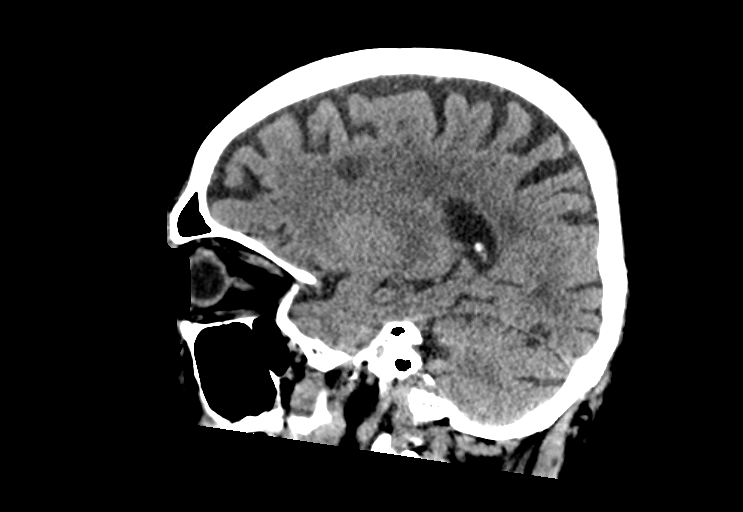
[im 27/53  brain]
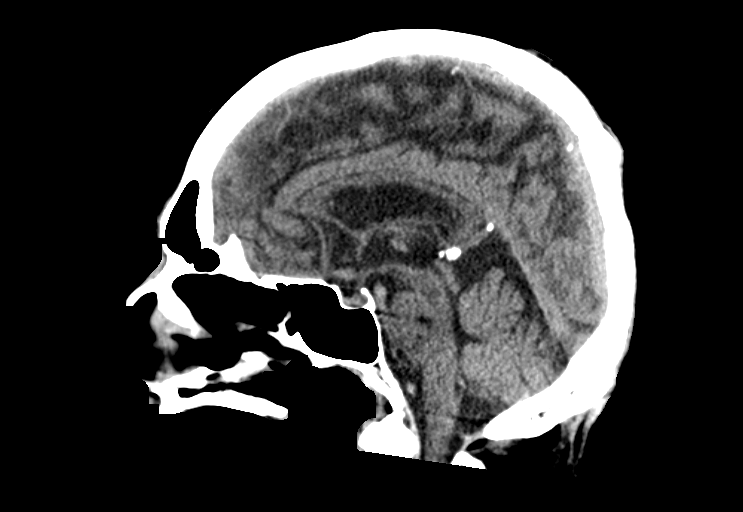
[im 35/53  brain]
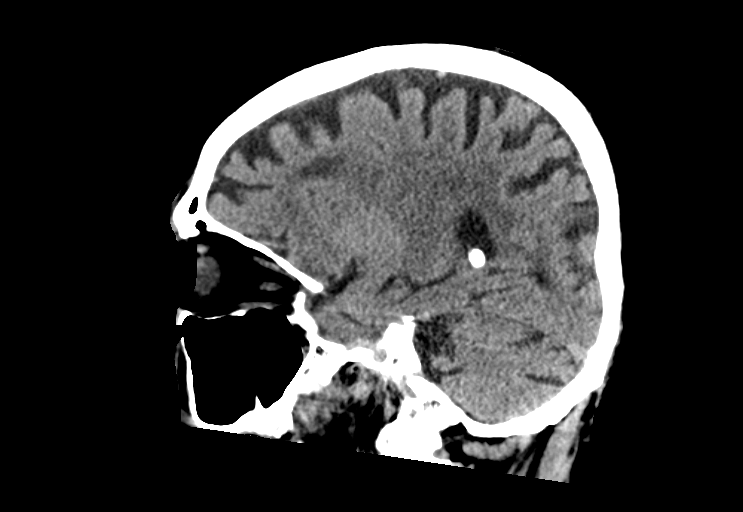

[15 of 47 positions shown; findings below may reference images not displayed]

FINDINGS: Brain: No evidence of acute large vessel territory infarction,
hemorrhage, hydrocephalus, extra-axial collection or mass
lesion/mass effect. Superficial and central atrophy with chronic
appearing small vessel ischemic disease of periventricular and
subcortical white matter.

Vascular: No hyperdense vessel or unexpected calcification.

Skull: Negative

Sinuses/Orbits: Left ocular lens replacement. The visualized
paranasal sinuses are clear.

Other: Clear mastoids bilaterally.
IMPRESSION: Cerebral atrophy with chronic small vessel ischemic disease. No
significant change from recent comparison.

## 2018-05-13 IMAGING — DX DG WRIST 2V*R*
2 series · 2 of 2 positions shown · non-contrast
Comparison: Radiograph 10/29/2015

CLINICAL DATA: RIGHT wrist pain. RIGHT wrist fracture two weeks
prior.

EXAM:
RIGHT WRIST - 2 VIEW

[wrist ap]
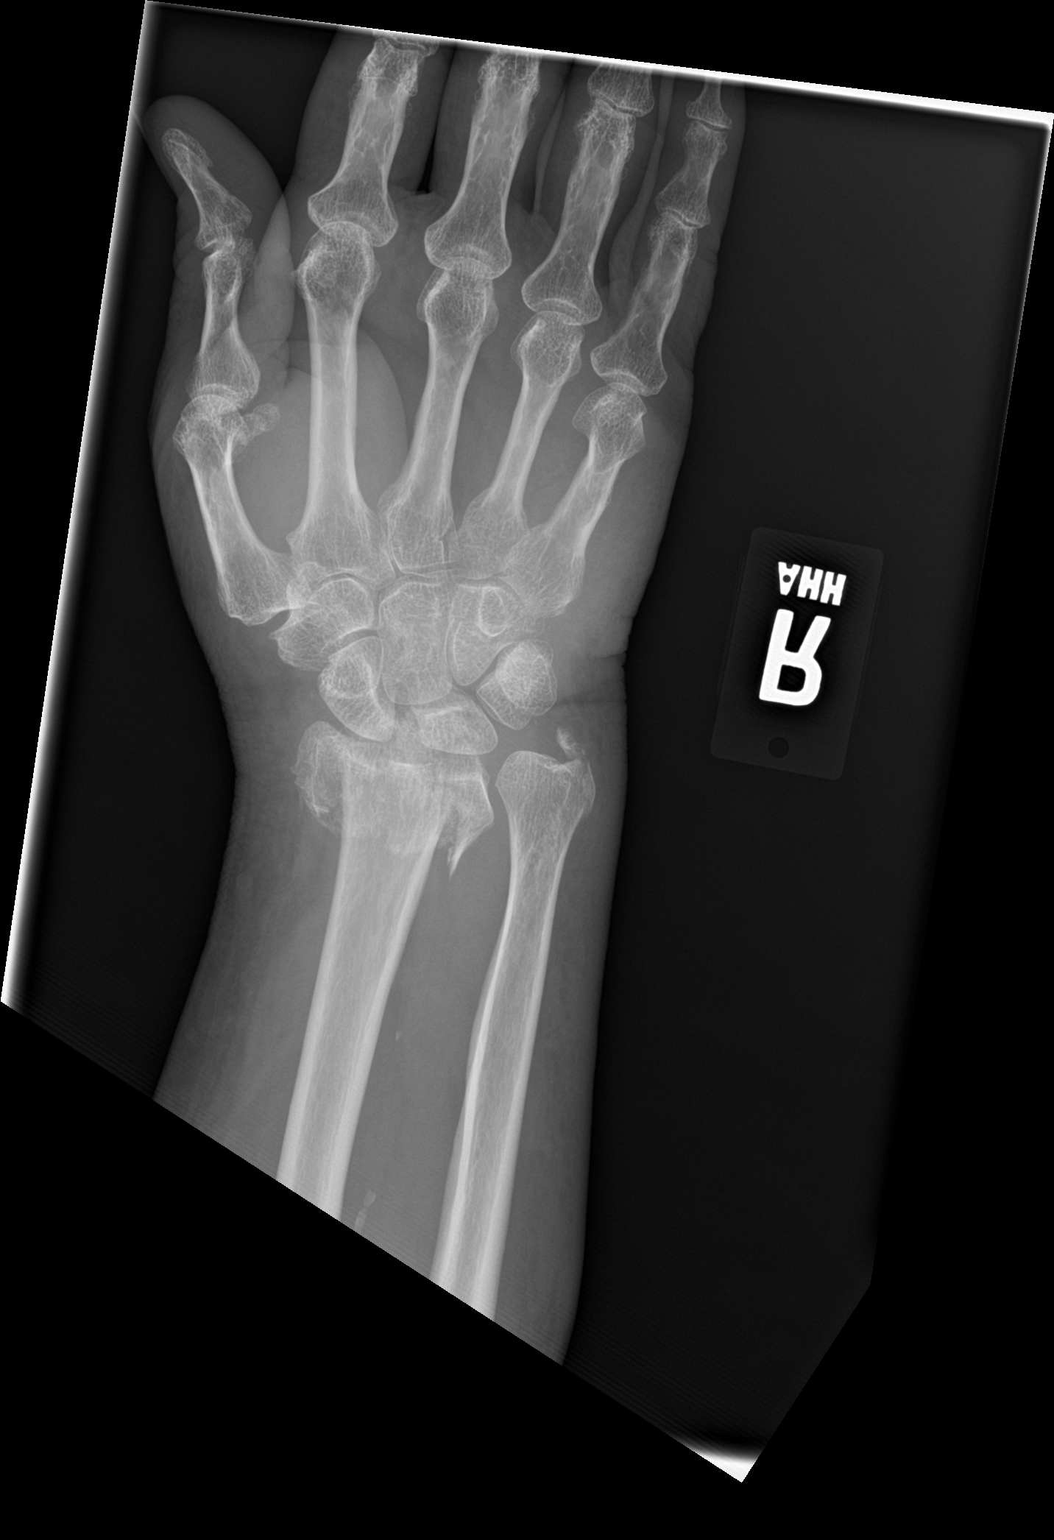

[wrist lat]
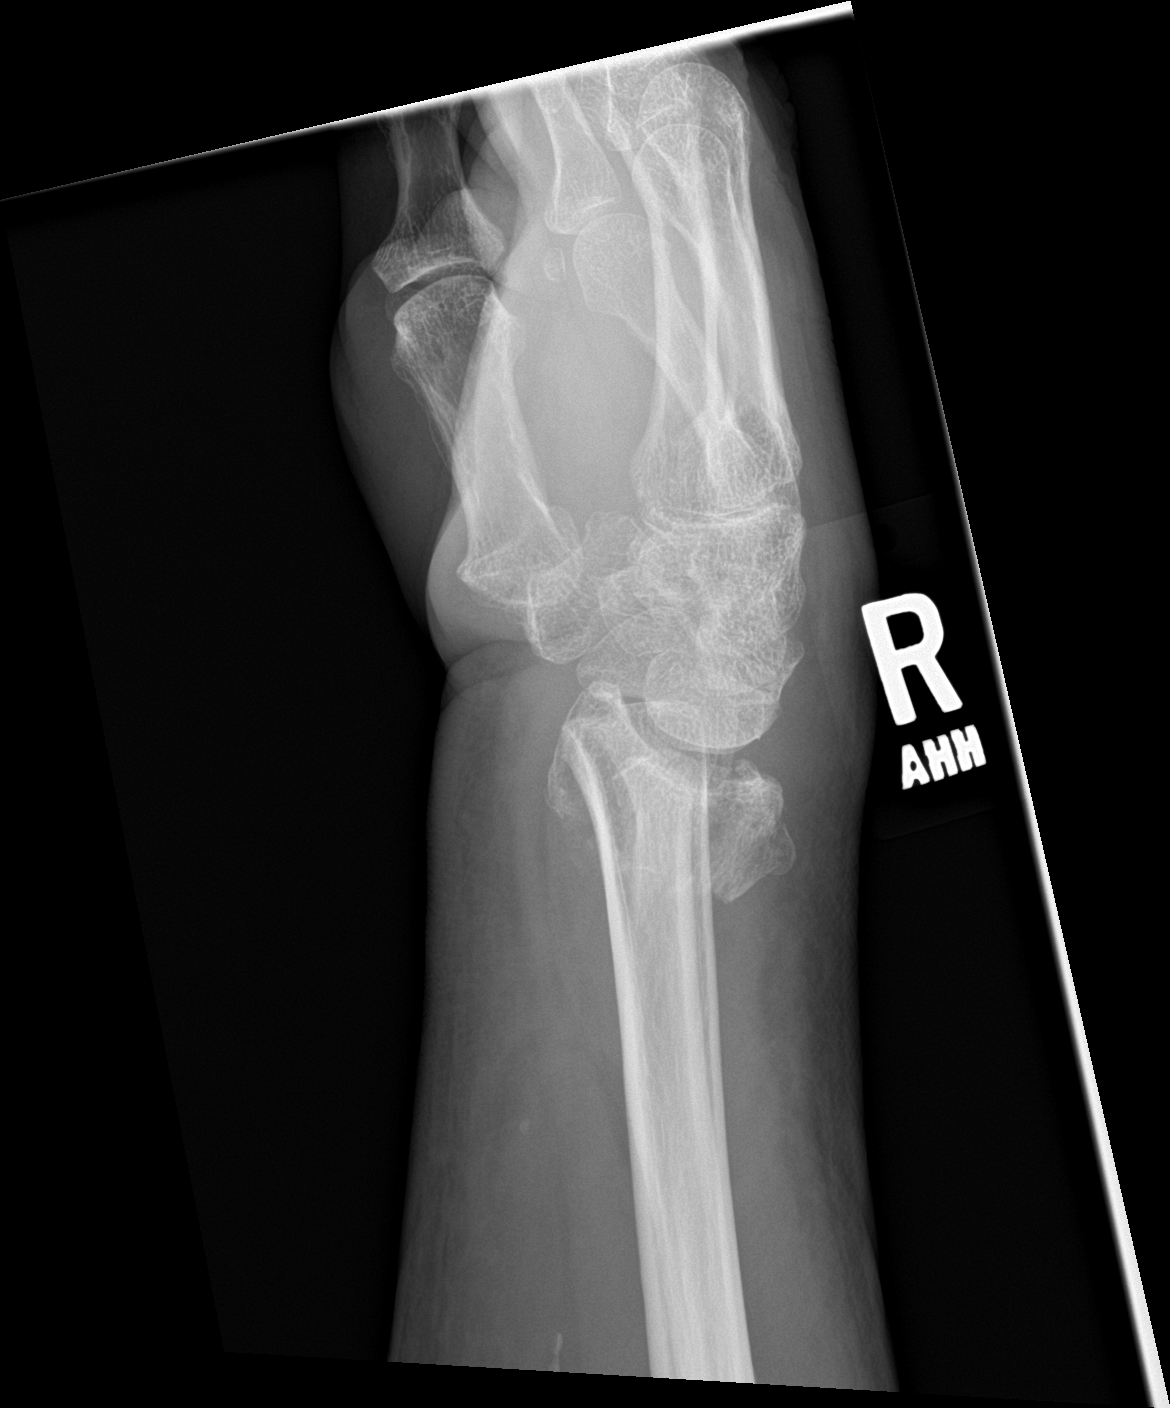

[2 of 2 positions shown; findings below may reference images not displayed]

FINDINGS: Impaction fracture of the distal radial metaphysis with comminution
of the epiphysis. The comminuted fragments appears increased in
displacement compared to radiograph 10/29/2015. Radiocarpal joint is
grossly intact. Ulnar styloid fracture noted.

Scaphoid lunate interval is widened to 5 mm.
IMPRESSION: Again demonstrated comminuted fracture of the distal RIGHT radius.
The comminuted fragments are increased in displacement compared to
radiograph of 10/29/2015.

## 2018-05-14 IMAGING — MR MR HEAD W/O CM
8 of 10 series · 37 of 48 positions shown · non-contrast
Comparison: CT HEAD November 14, 2015 and MRI of the head
October 22, 2014

CLINICAL DATA: Encephalopathy. 3-4 days of worsening altered mental
status, increasing agitation. History of atrial fibrillation,
bipolar disorder, seizure disorder, Parkinson's, recurrent falls.

EXAM:
MRI HEAD WITHOUT CONTRAST
TECHNIQUE: Multiplanar, multiecho pulse sequences of the brain and surrounding
structures were obtained without intravenous contrast.

[Series 3: T1 · sagittal · 5.0mm · 0.47mm/px · 2 of 27 slices shown]
[im 1/27]
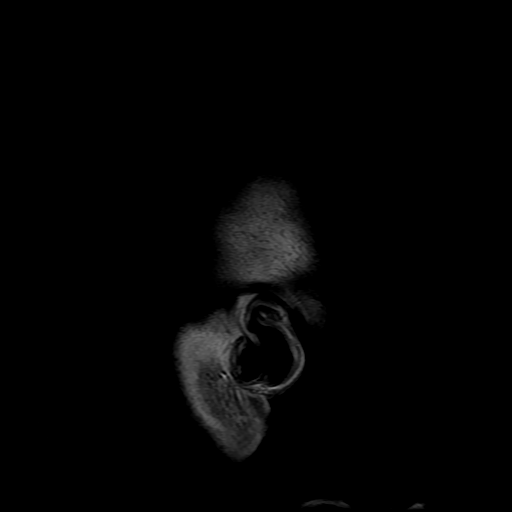
[im 27/27]
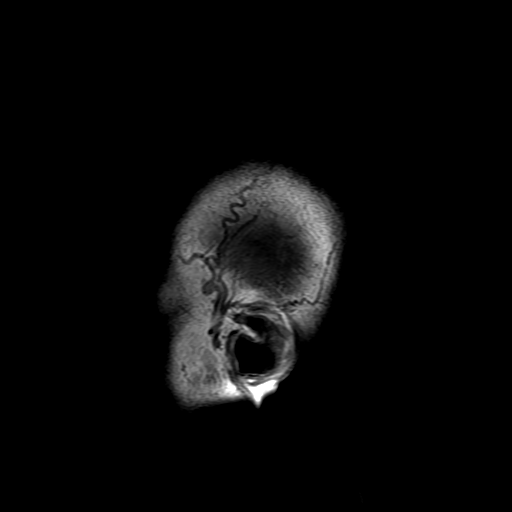

[Series 4: DWI · axial · 3.0mm · 1.09mm/px · z∈[-93,+63]mm · 8 of 112 slices shown (1 of 4)]
[im 1/112]
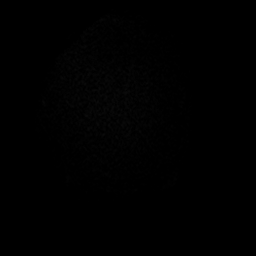
[im 13/112]
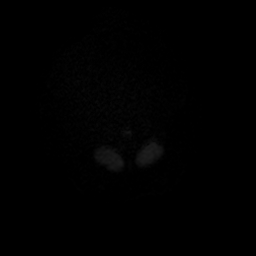
[im 38/112]
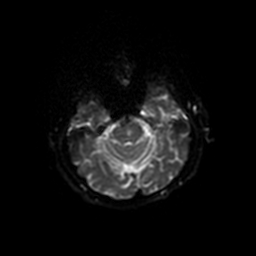
[im 50/112]
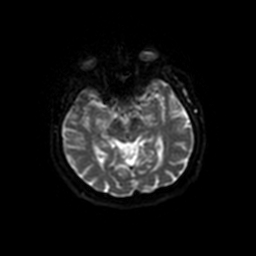
[im 62/112]
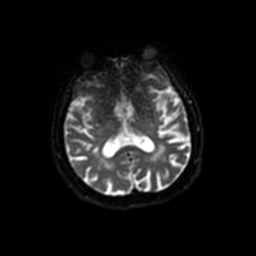
[im 75/112]
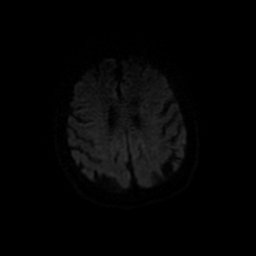
[im 99/112]
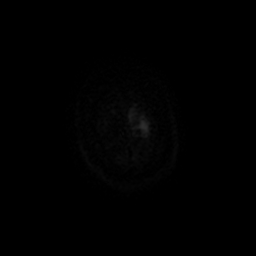
[im 112/112]
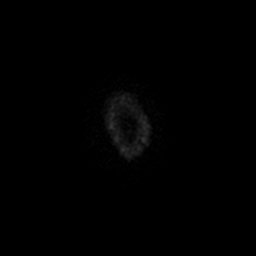

[Series 5: DWI · coronal · 5.0mm · 1.09mm/px · 8 of 76 slices shown (2 of 4)]
[im 1/76]
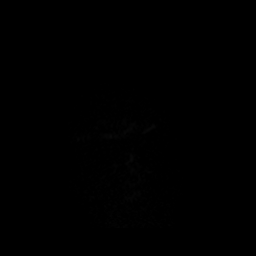
[im 11/76]
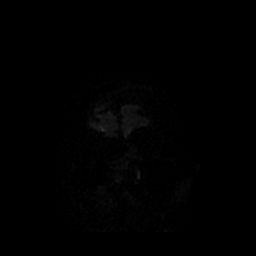
[im 22/76]
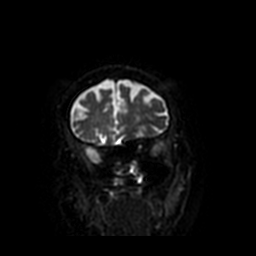
[im 33/76]
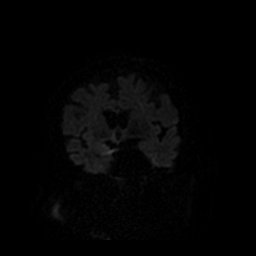
[im 43/76]
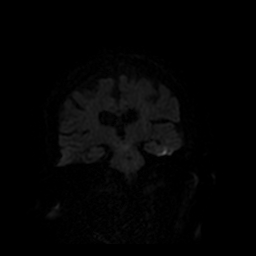
[im 54/76]
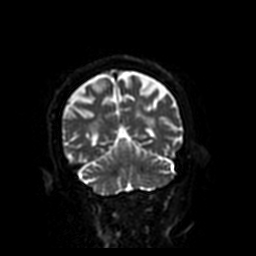
[im 65/76]
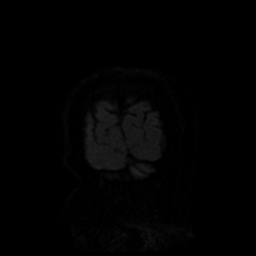
[im 76/76]
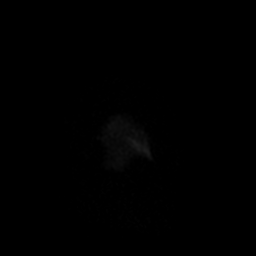

[Series 6: T2 · axial · 5.0mm · 0.43mm/px · z∈[-63,+94]mm · 3 of 25 slices shown]
[im 1/25]
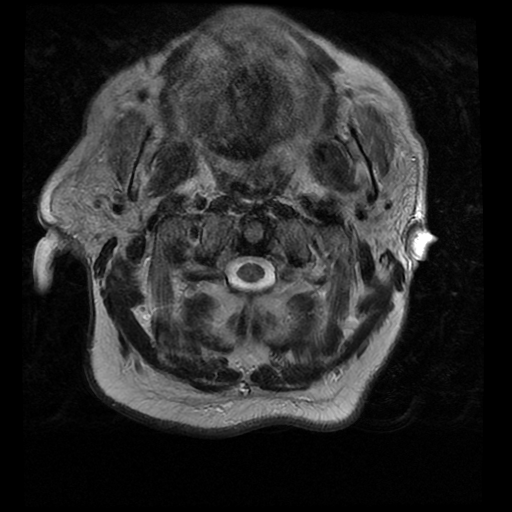
[im 13/25]
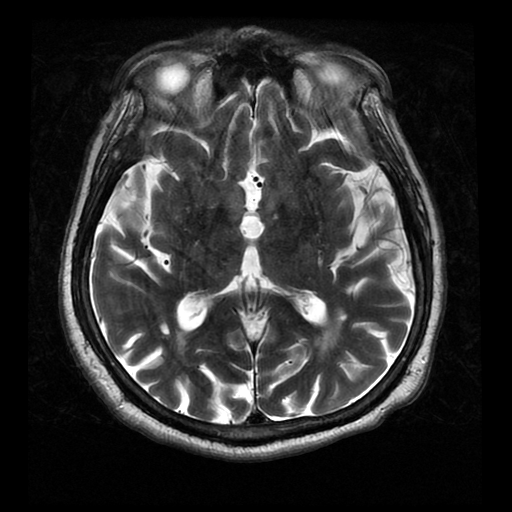
[im 25/25]
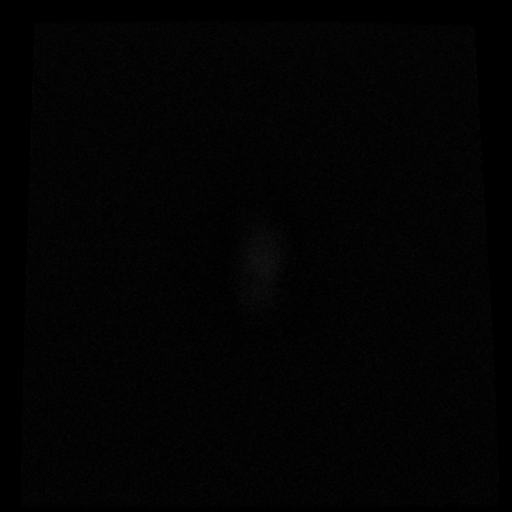

[Series 7: FLAIR · axial · 5.0mm · 0.43mm/px · z∈[-69,+100]mm · 3 of 26 slices shown]
[im 1/26]
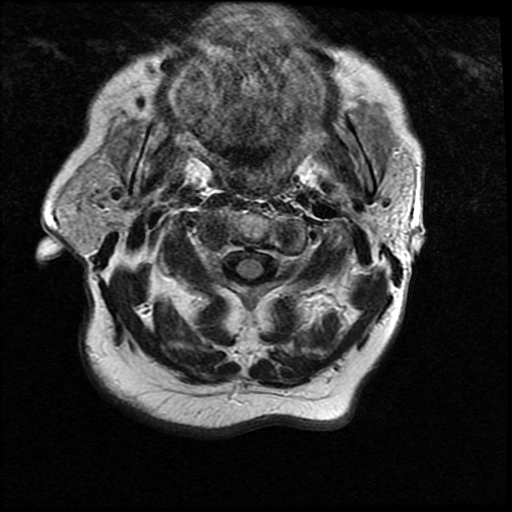
[im 13/26]
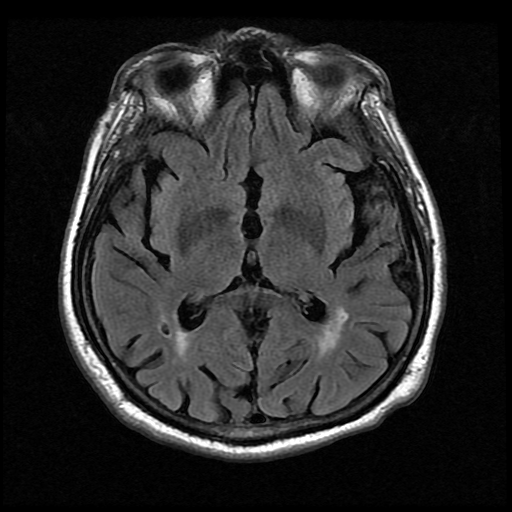
[im 26/26]
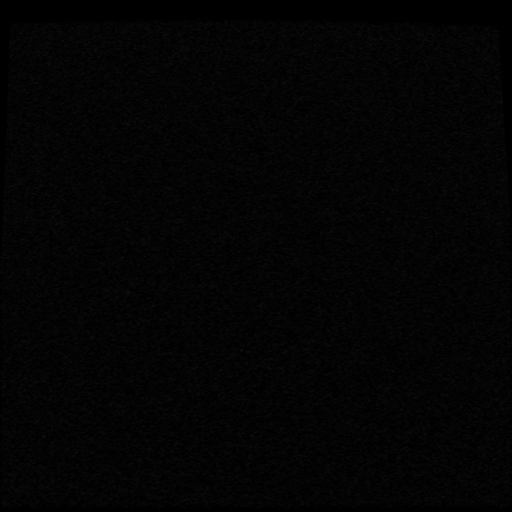

[Series 10: T2 post-contrast · coronal · 5.0mm · 0.45mm/px · 3 of 29 slices shown]
[im 1/29]
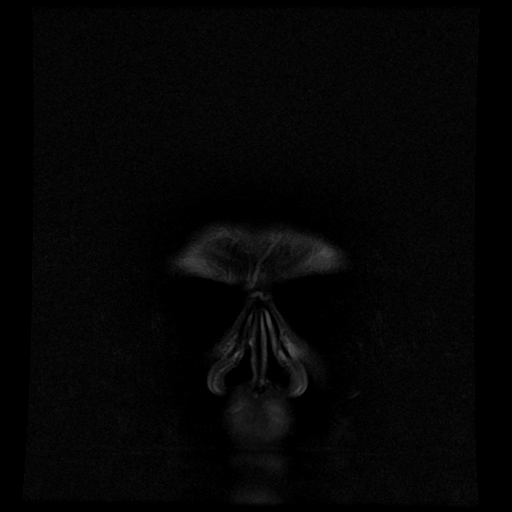
[im 15/29]
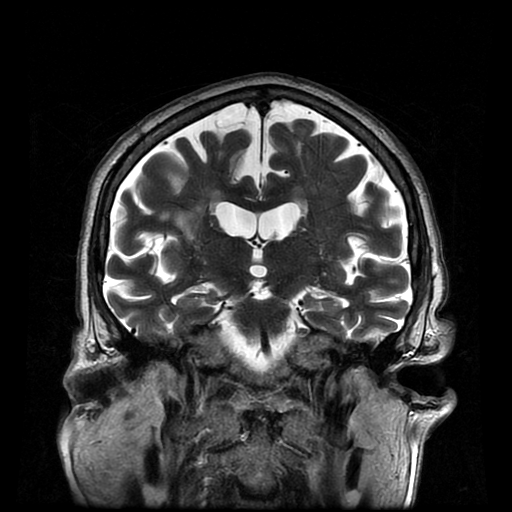
[im 29/29]
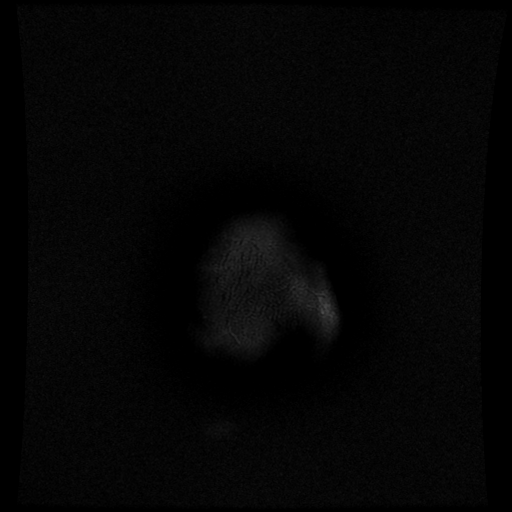

[Series 400: DWI · axial · 3.0mm · 1.09mm/px · z∈[-93,+63]mm · 6 of 56 slices shown (3 of 4)]
[im 1/56]
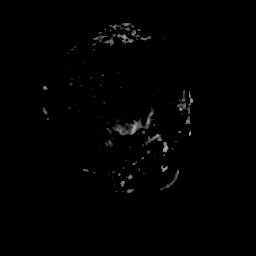
[im 12/56]
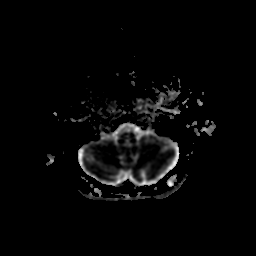
[im 23/56]
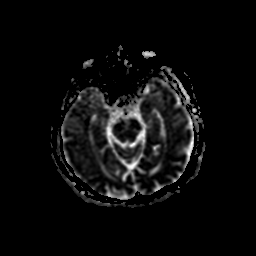
[im 34/56]
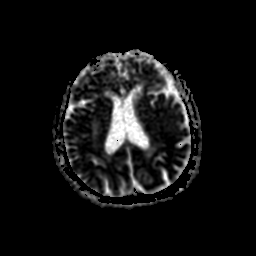
[im 45/56]
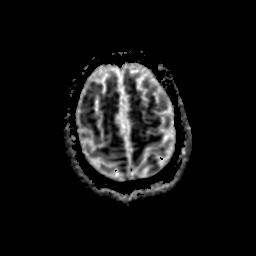
[im 56/56]
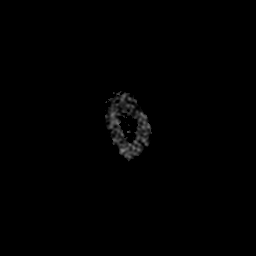

[Series 500: DWI · coronal · 5.0mm · 1.09mm/px · 4 of 37 slices shown (4 of 4)]
[im 1/37]
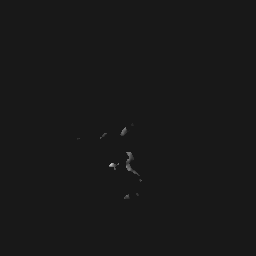
[im 13/37]
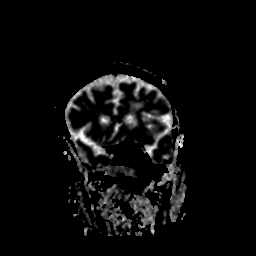
[im 25/37]
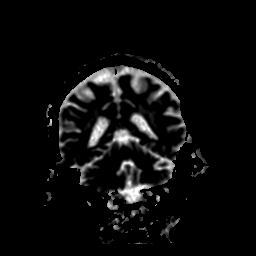
[im 37/37]
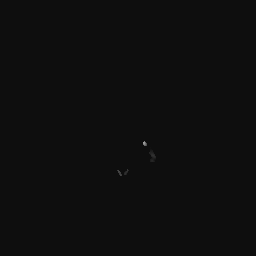

[37 of 48 positions shown; findings below may reference images not displayed]

FINDINGS: Multiple sequences are mildly motion degraded.

BRAIN: No reduced diffusion to suggest acute ischemia. No
susceptibility artifact to suggest hemorrhage. The ventricles and
sulci are normal for patient's age. Old small LEFT cerebellar
infarct. Patchy to confluent supratentorial and pontine white matter
FLAIR T2 hyperintensities, with superimposed focal cystic changes.
No suspicious parenchymal signal, masses or mass effect. No abnormal
extra-axial fluid collections. No extra-axial masses though,
contrast enhanced sequences would be more sensitive.

VASCULAR: Normal major intracranial vascular flow voids present at
skull base.

SKULL AND UPPER CERVICAL SPINE: No abnormal sellar expansion. No
suspicious calvarial bone marrow signal. Craniocervical junction
maintained.

SINUSES/ORBITS: The mastoid air-cells and included paranasal sinuses
are well-aerated. Status post LEFT ocular lens implant. The included
ocular globes and orbital contents are non-suspicious.

OTHER: Patient is edentulous.
IMPRESSION: No acute intracranial process.

Stable examination including moderate chronic small vessel ischemic
disease and old LEFT cerebellar small infarct.

## 2018-05-29 IMAGING — US US ABDOMEN LIMITED
1 series · 14 of 25 positions shown · non-contrast
Comparison: Abdominal and pelvic CT scan October 10, 2014

CLINICAL DATA: Elevated ammonia levels. History of diabetes,
hyperlipidemia, obesity.

EXAM:
US ABDOMEN LIMITED - RIGHT UPPER QUADRANT

[Series 1: us abdomen limited · 0.28mm/px · 14 of 76 slices shown]
[im 1/76]
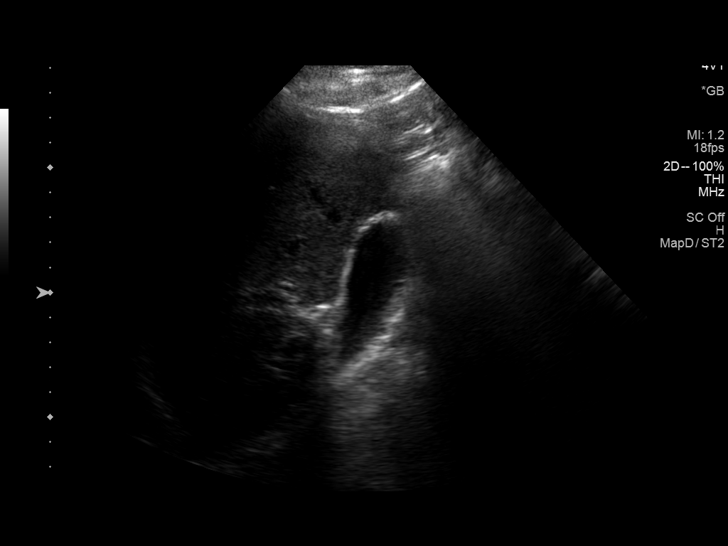
[im 7/76]
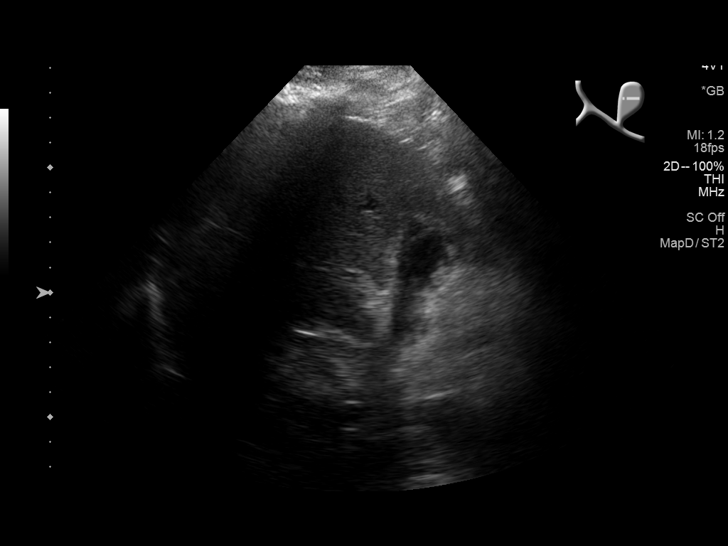
[im 13/76]
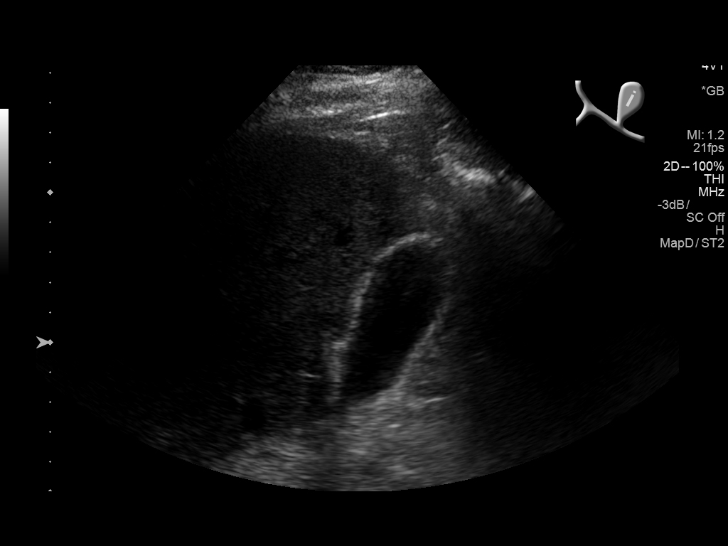
[im 19/76]
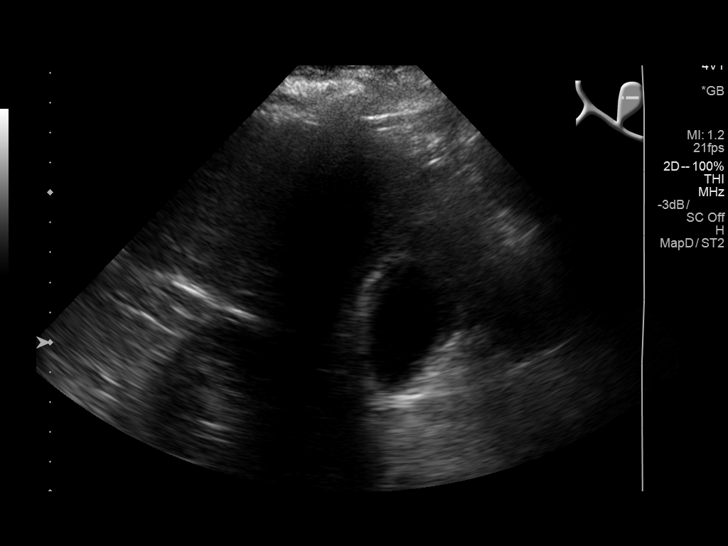
[im 26/76]
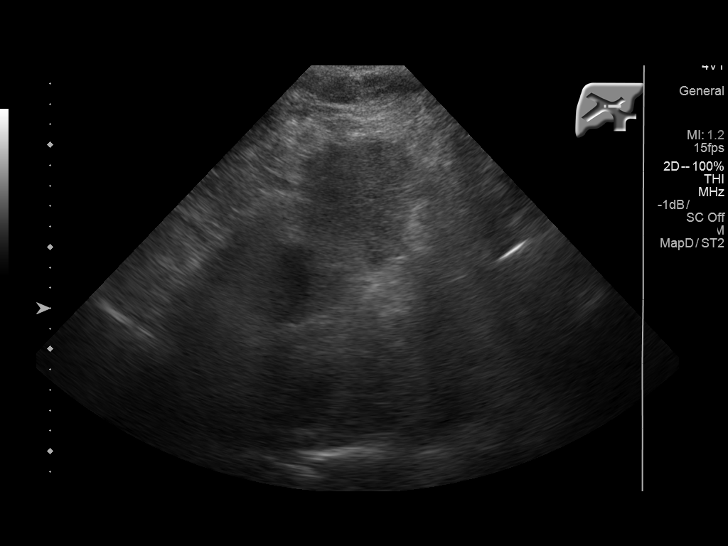
[im 29/76]
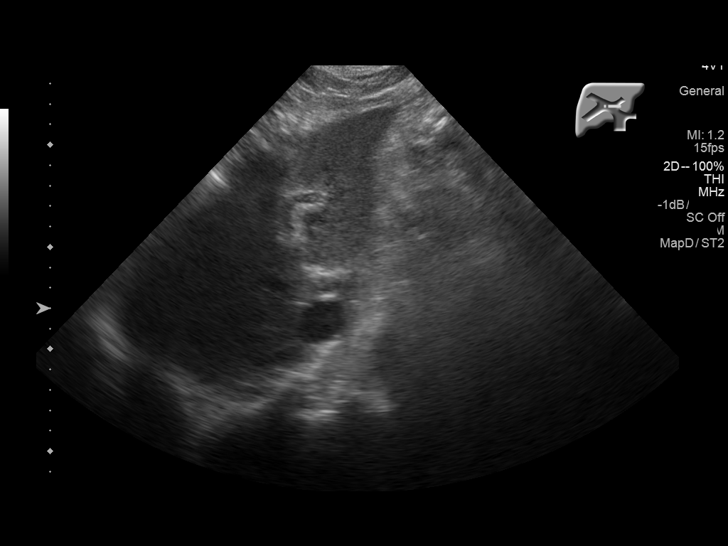
[im 35/76]
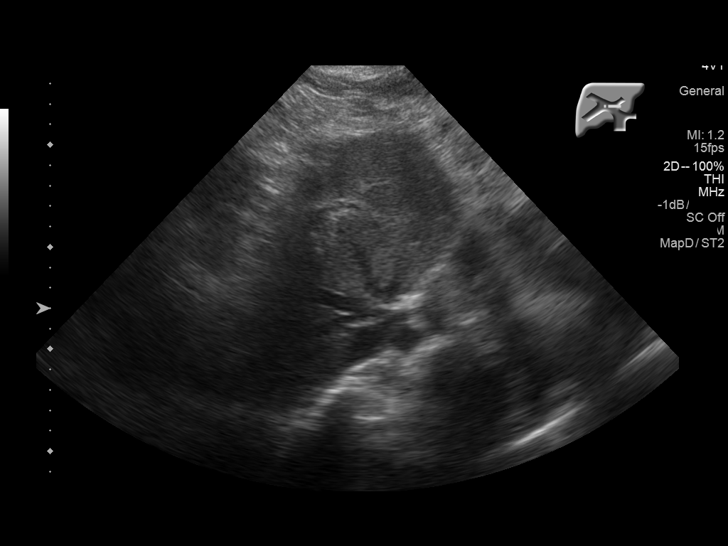
[im 41/76]
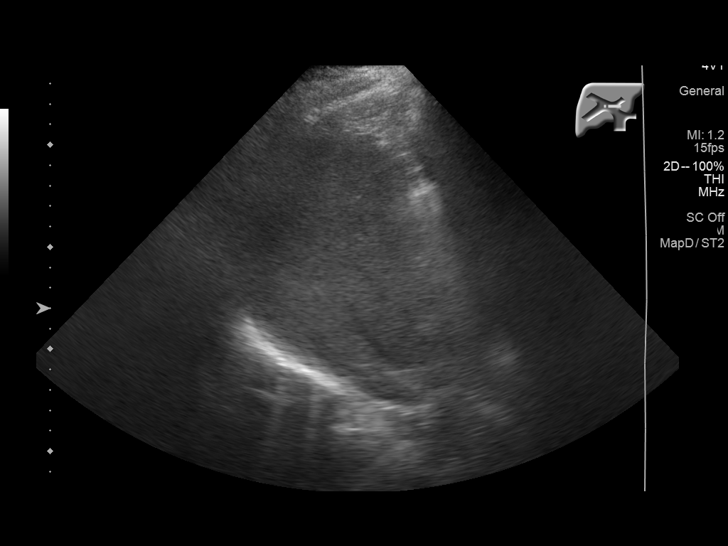
[im 47/76]
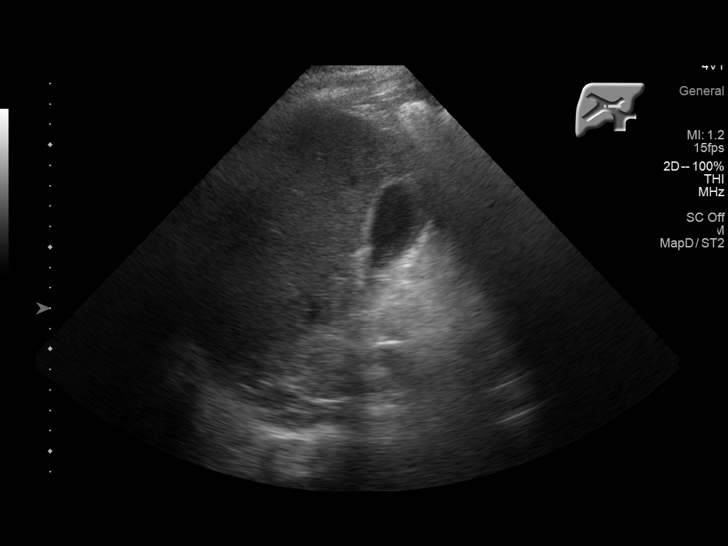
[im 51/76]
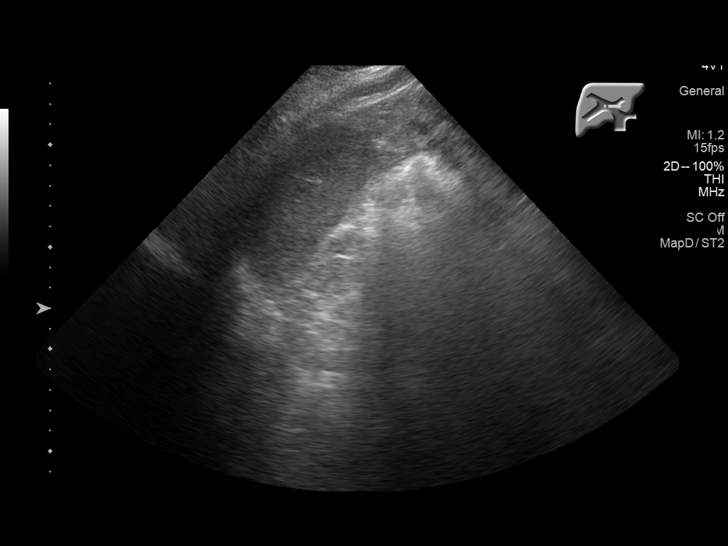
[im 57/76]
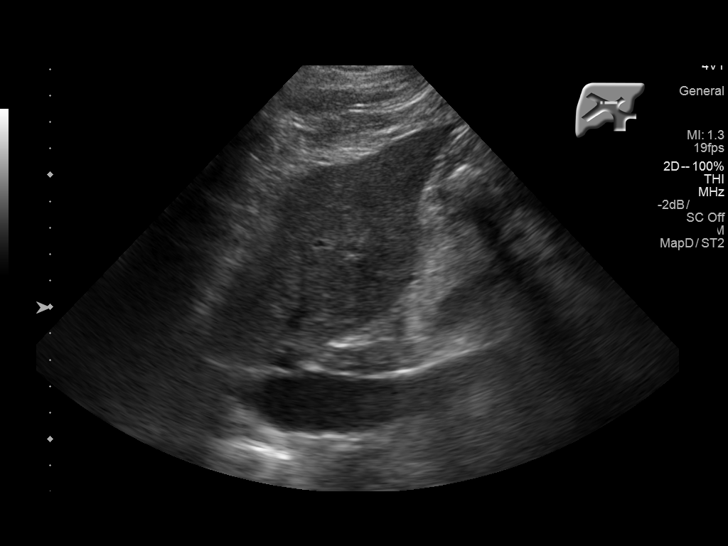
[im 63/76]
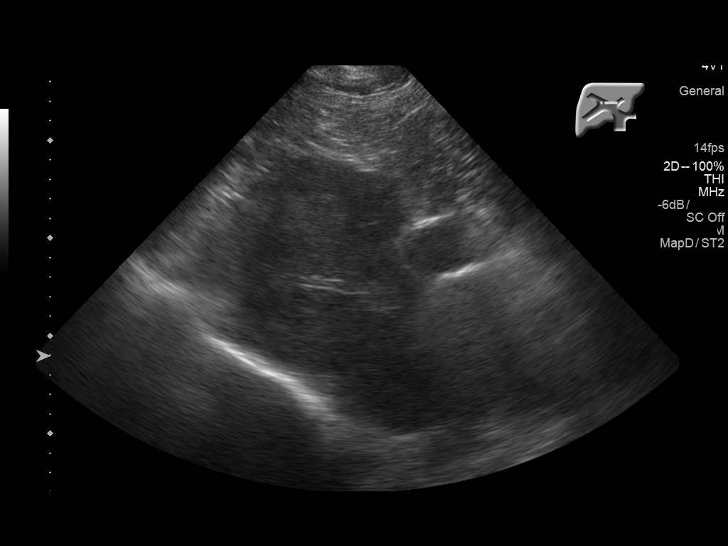
[im 69/76]
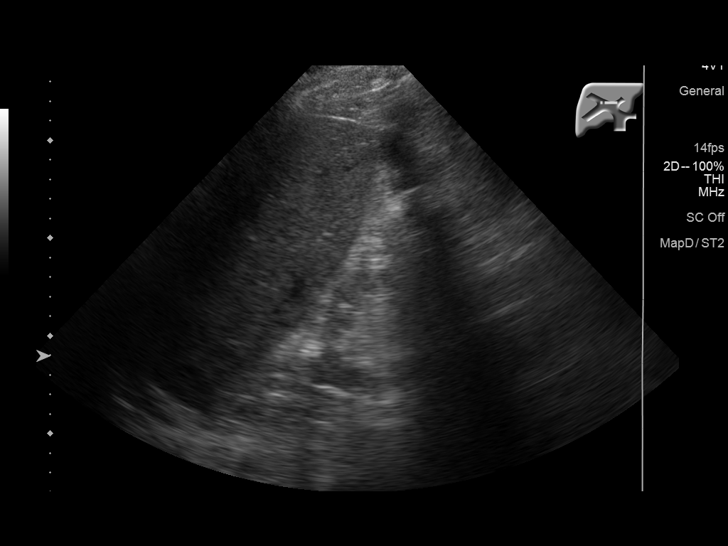
[im 76/76]
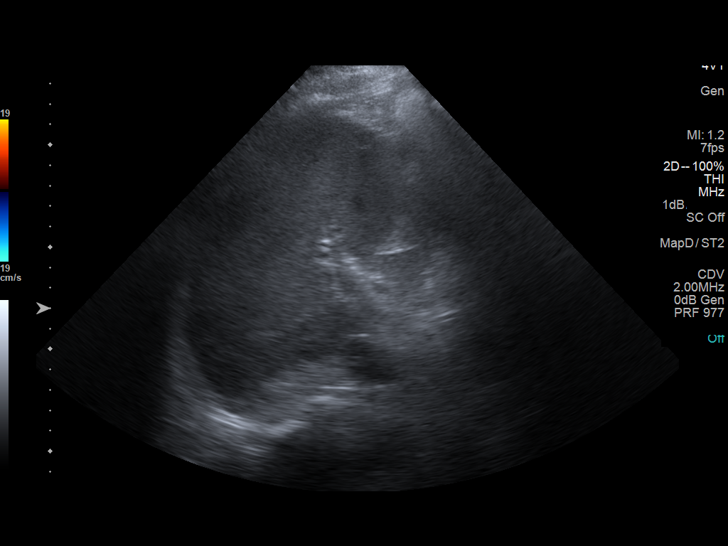

[14 of 25 positions shown; findings below may reference images not displayed]

FINDINGS: Gallbladder:

No gallstones or wall thickening visualized. No sonographic Murphy
sign noted by sonographer.

Common bile duct:

Diameter: 4.7 mm

Liver:

The hepatic echotexture is normal. There is no focal mass or ductal
dilation. The surface contour of the liver is normal.
IMPRESSION: Normal limited right upper quadrant ultrasound examination.

## 2018-07-27 IMAGING — DX DG CHEST 2V
2 series · 2 of 2 positions shown · non-contrast
Comparison: 11/14/2015

CLINICAL DATA: Cough for 3 days.

EXAM:
CHEST  2 VIEW

[x chest ap]
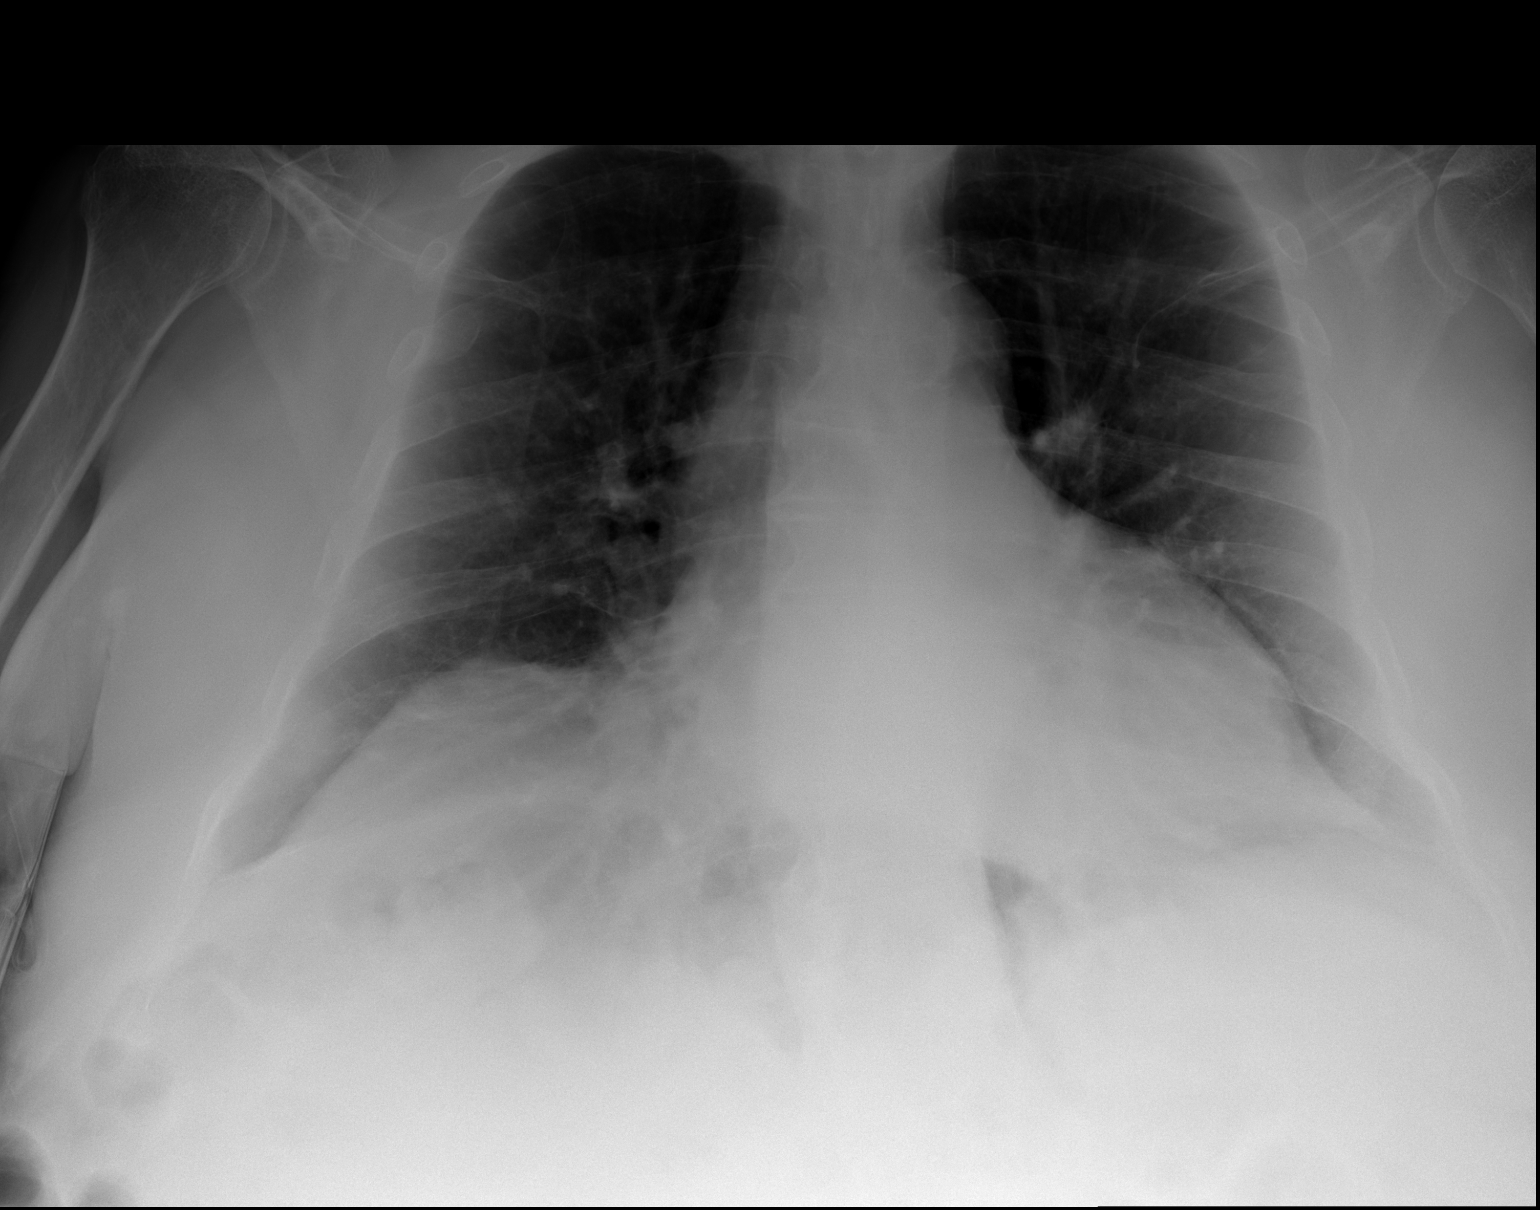

[w chest lat]
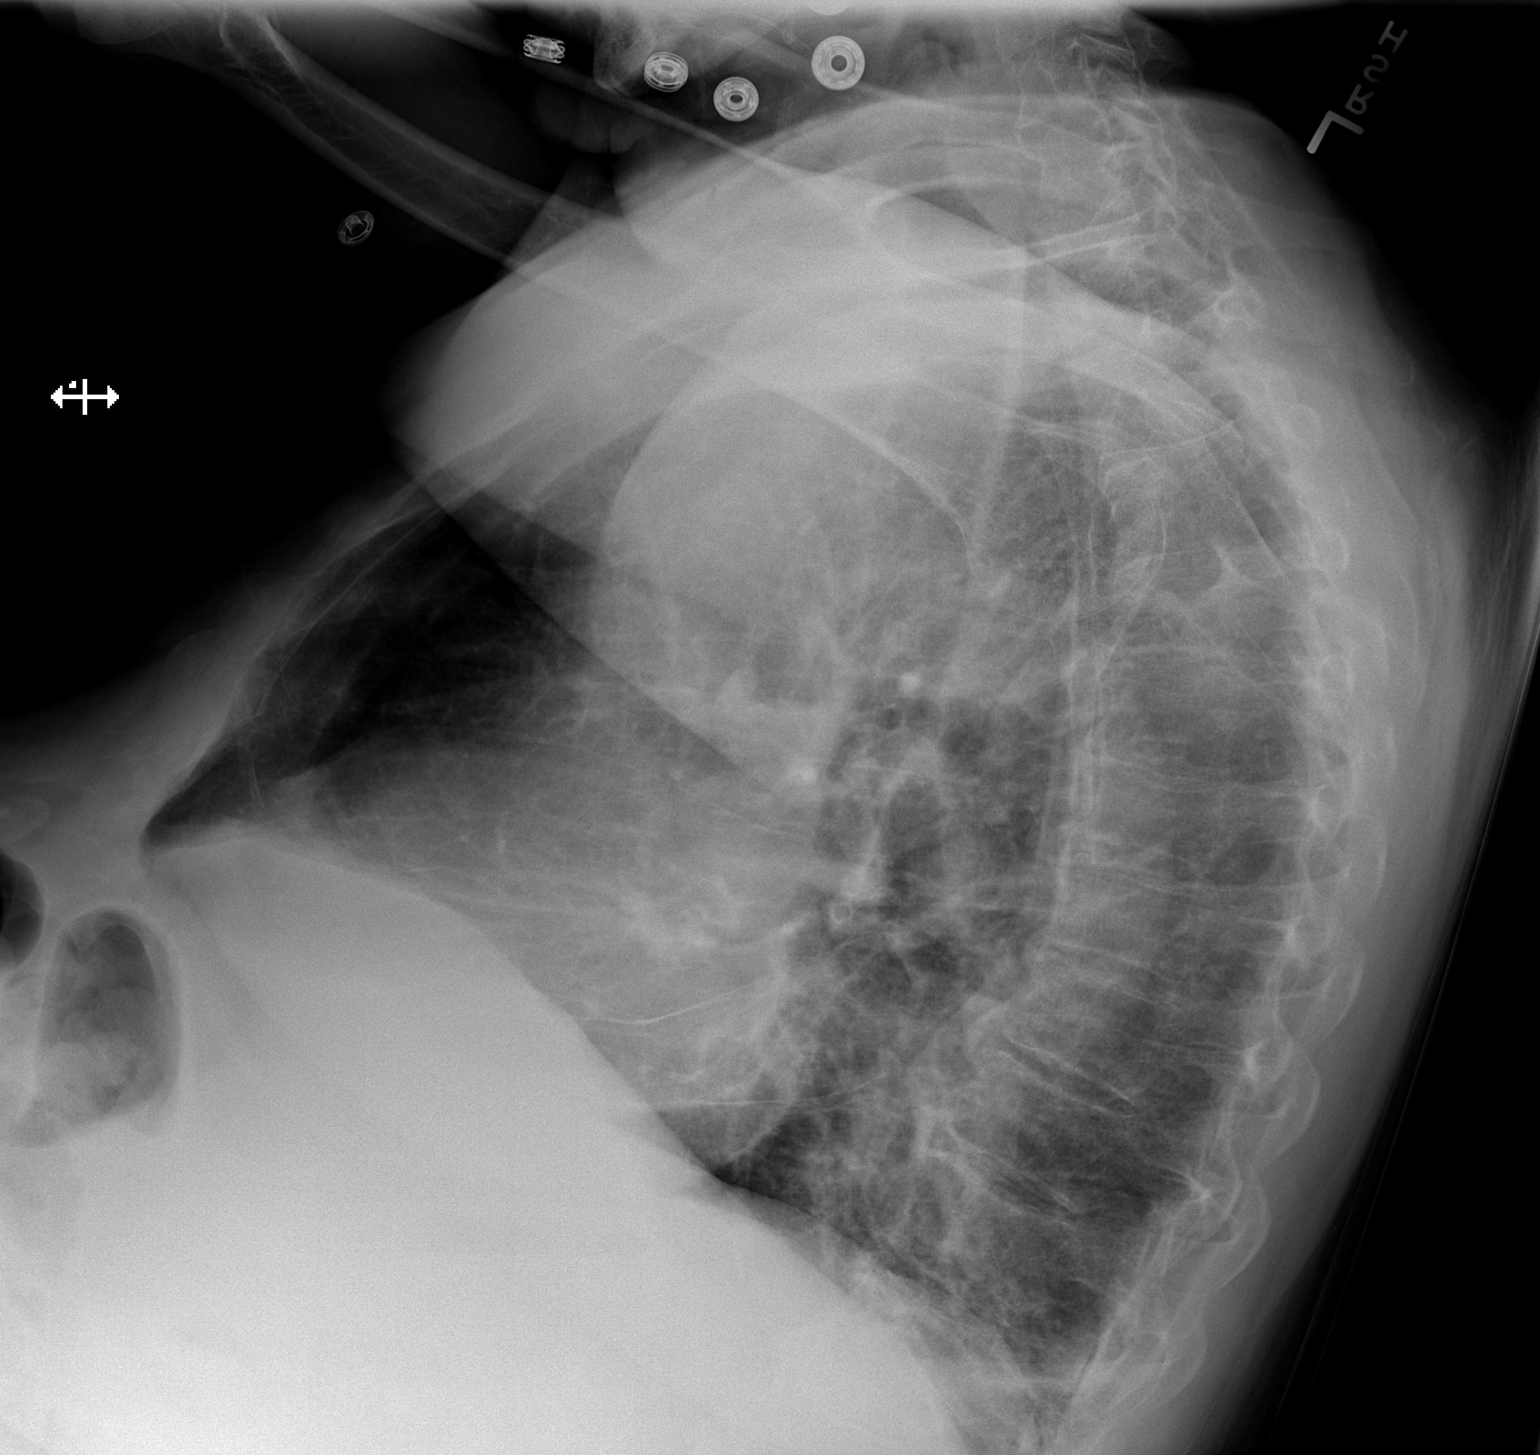

[2 of 2 positions shown; findings below may reference images not displayed]

FINDINGS: The heart size and mediastinal contours are within normal limits.
Both lungs are clear. The visualized skeletal structures are
unremarkable.
IMPRESSION: No active cardiopulmonary disease.

## 2018-09-25 IMAGING — CT CT L SPINE W/O CM
3 of 6 series · 9 of 33 positions shown, 10 images · non-contrast
Comparison: MRI lumbar spine 07/26/2014.

CLINICAL DATA: Low back and bilateral leg pain. The patient reports
he is unable to walk. No known injury.

EXAM:
CT LUMBAR SPINE WITHOUT CONTRAST
TECHNIQUE: Multidetector CT imaging of the lumbar spine was performed without
intravenous contrast administration. Multiplanar CT image
reconstructions were also generated.

[Series 208: orthog upper · axial · 0.34mm/px · z∈[+375,+375]mm · 1 of 138 slices shown, 2 images]
[im 69/138  soft-tissue]
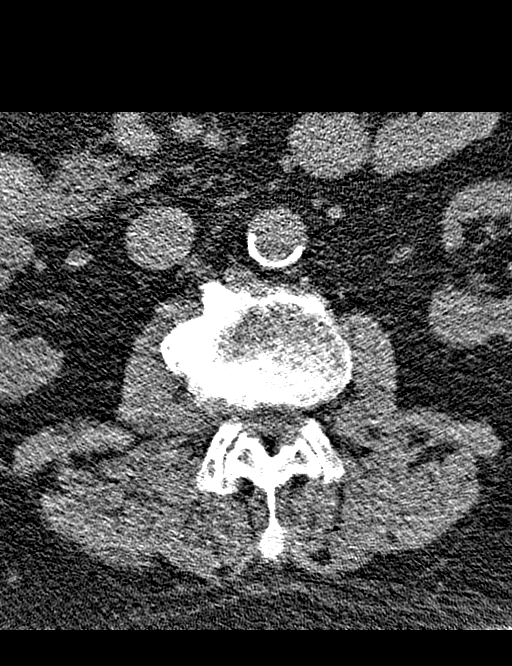
[im 69/138  bone]
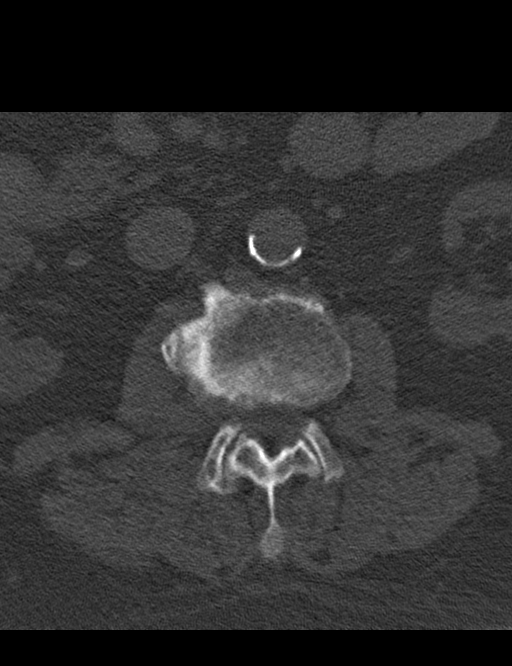

[Series 2011: coronal lower · coronal · 0.34mm/px · 3 of 78 slices shown]
[im 14/78  bone]
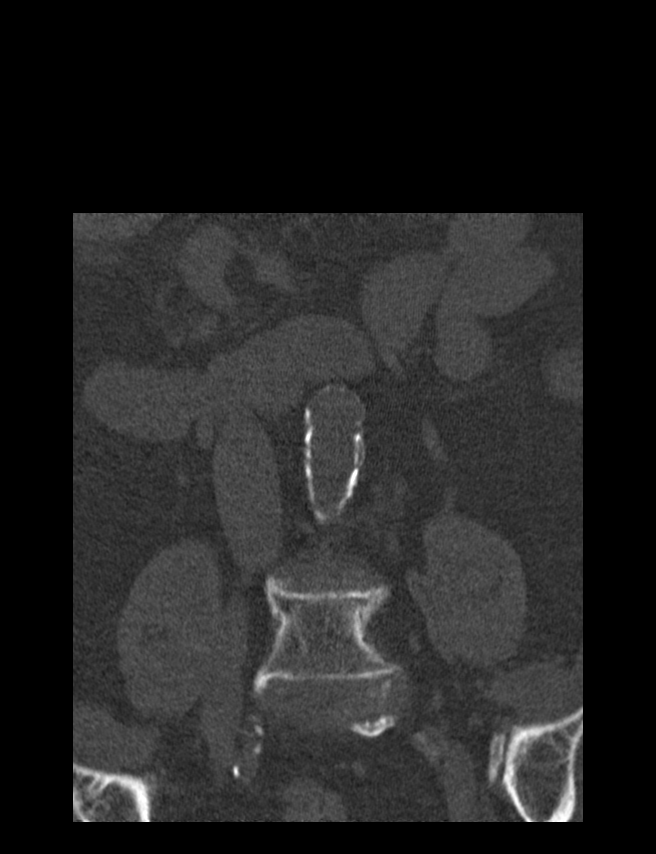
[im 39/78  bone]
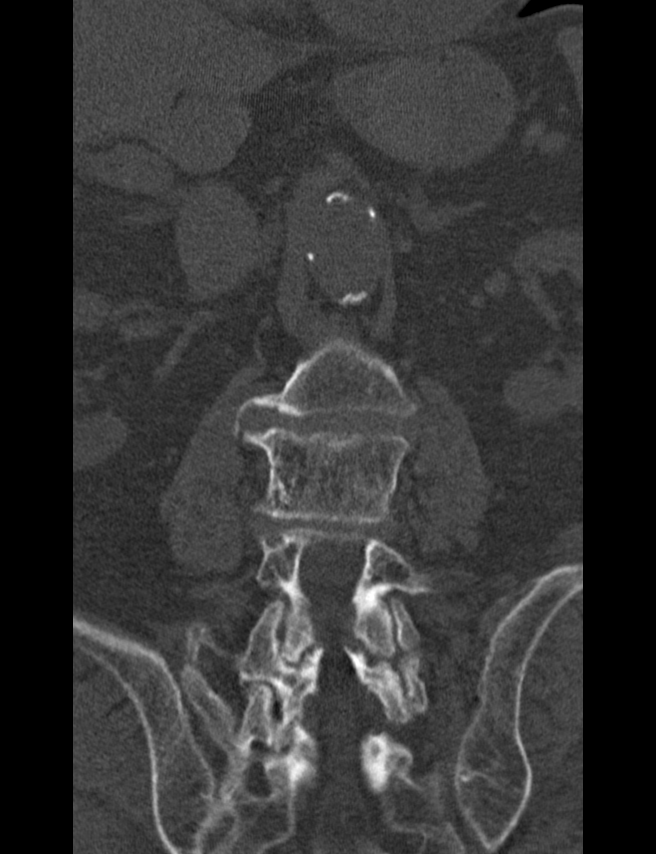
[im 65/78  bone]
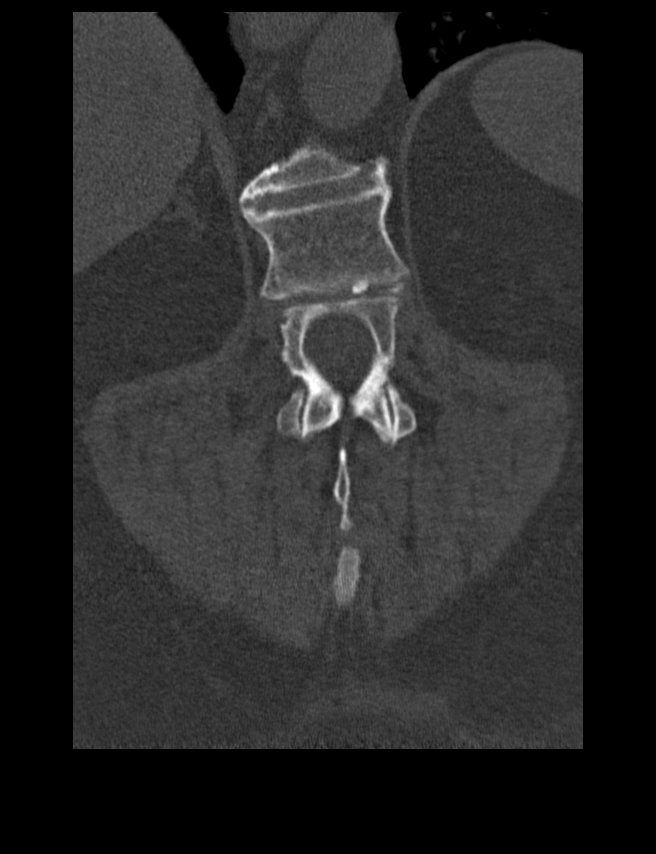

[Series 2012: sag · sagittal · 0.35mm/px · 5 of 82 slices shown]
[im 28/82  bone]
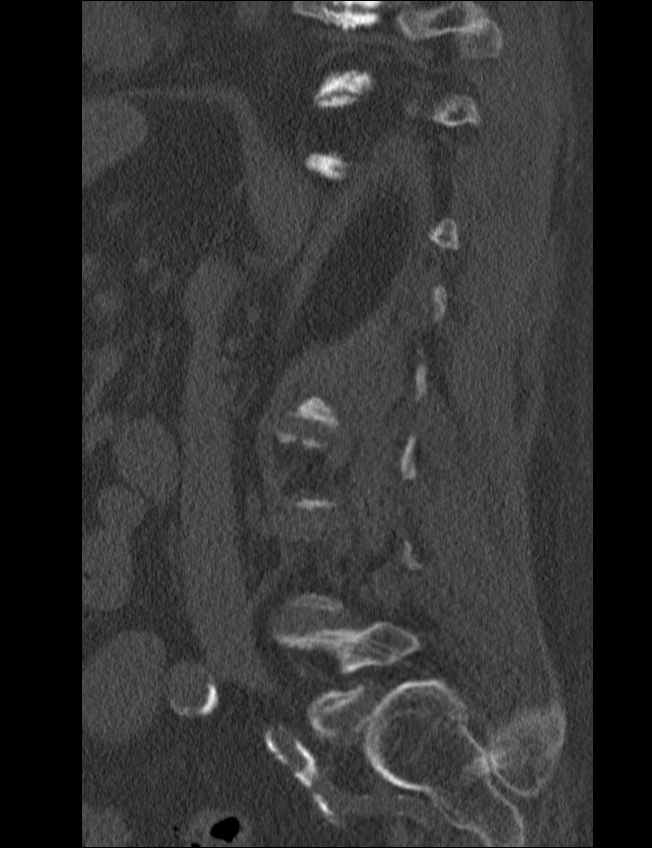
[im 34/82  bone]
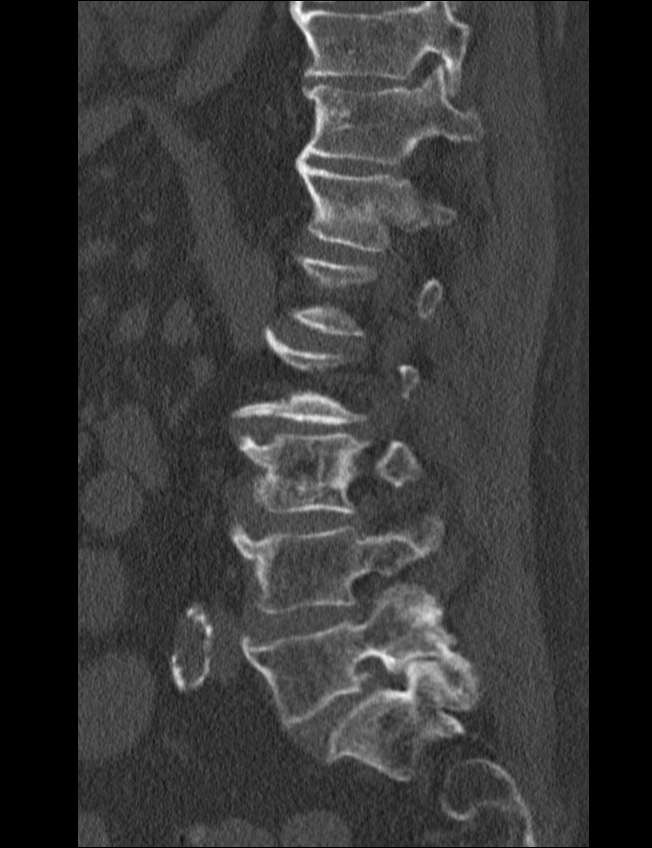
[im 41/82  bone]
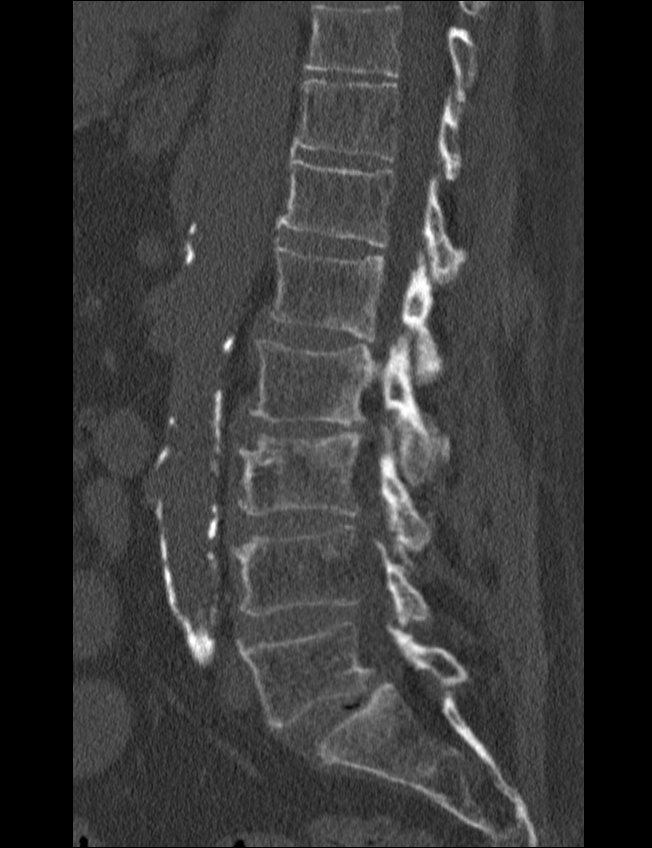
[im 48/82  bone]
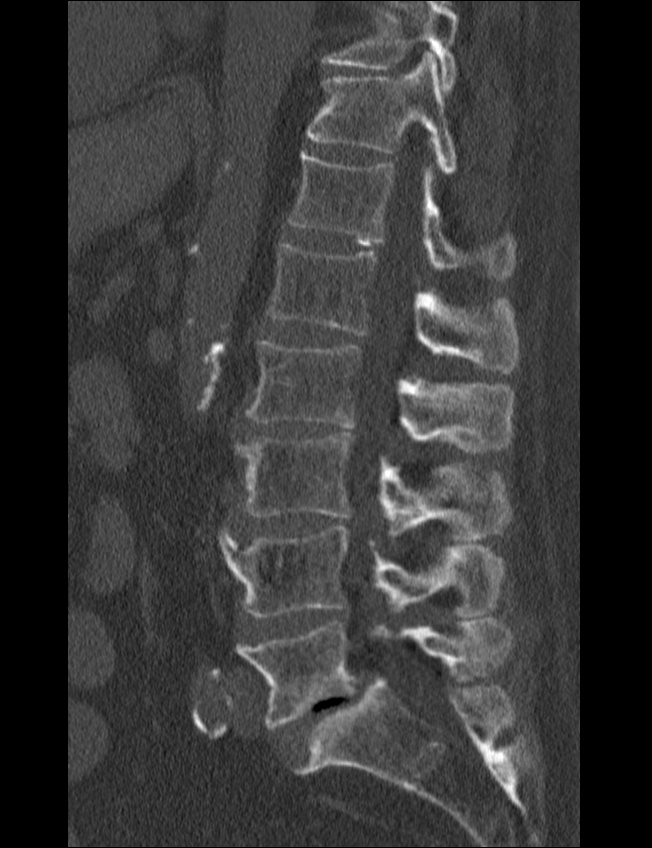
[im 55/82  bone]
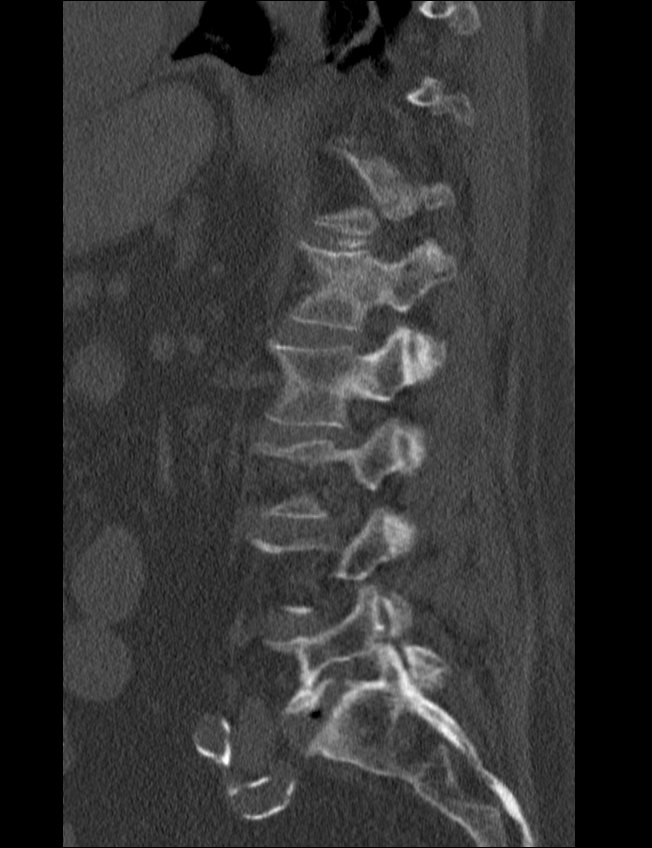

[9 of 33 positions shown; findings below may reference images not displayed]

FINDINGS: Segmentation: Standard.

Alignment: 0.9 cm anterolisthesis L5 on S1 is due to advanced facet
degenerative change. Alignment is otherwise maintained.

Vertebrae: No fracture or worrisome lesion is identified.

Paraspinal and other soft tissues: Extensive aortoiliac
atherosclerosis without aneurysm is identified. Sigmoid
diverticulosis is noted.

Disc levels: T10-11:  Negative.

T11-12: Negative.

T12-L1: Mild facet degenerative change is worse on the right. The
central canal and foramina appear open.

L1-2: Mild disc bulge and facet degenerative disease. The central
canal and foramina are open.

L2-3: Shallow disc bulge to the right. There is mild central canal
and right foraminal narrowing. The appearance is not grossly
changed.

L3-4: Shallow disc bulge and facet degenerative disease. Mild to
moderate central canal narrowing is seen. The foramina appear open.

L4-5:  Minimal disc bulge without central canal foraminal narrowing.

L5-S1: The disc is uncovered. Anterolisthesis results in mild to
moderate bilateral foraminal narrowing.
IMPRESSION: No marked change in the appearance lumbar spine compared to prior
MRI.

0.9 cm anterolisthesis L5 on S1. The central canal is open. Moderate
bilateral foraminal narrowing due to anterolisthesis is noted.

## 2018-09-25 IMAGING — CT CT RENAL STONE PROTOCOL
2 of 4 series · 8 of 46 positions shown, 9 images · non-contrast
Comparison: 12/30/2014

CLINICAL DATA: Right low back pain.

EXAM:
CT ABDOMEN AND PELVIS WITHOUT CONTRAST
TECHNIQUE: Multidetector CT imaging of the abdomen and pelvis was performed
following the standard protocol without IV contrast.

[Series 201: stone study, idose (2) · axial · 0.98mm/px · z∈[+117,+487]mm · 5 of 102 slices shown, 6 images]
[im 14/102  soft-tissue]
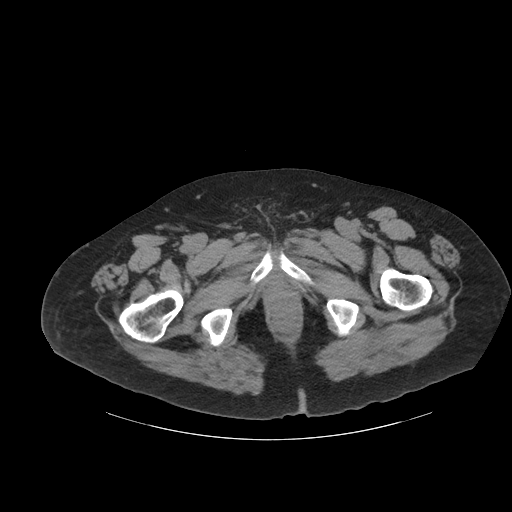
[im 14/102  bone]
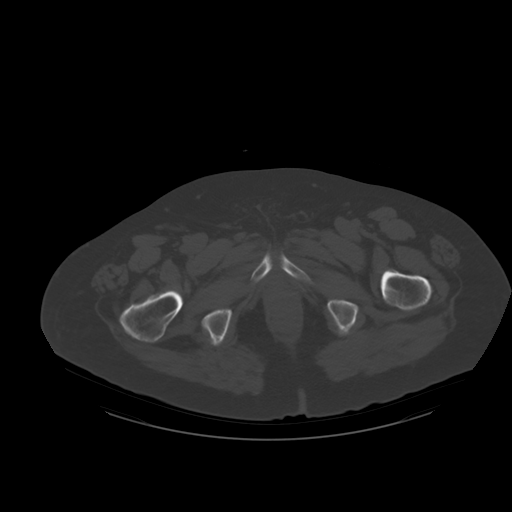
[im 31/102  soft-tissue]
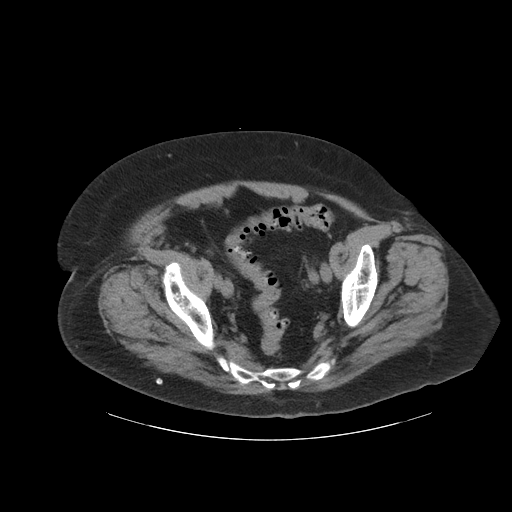
[im 53/102  soft-tissue]
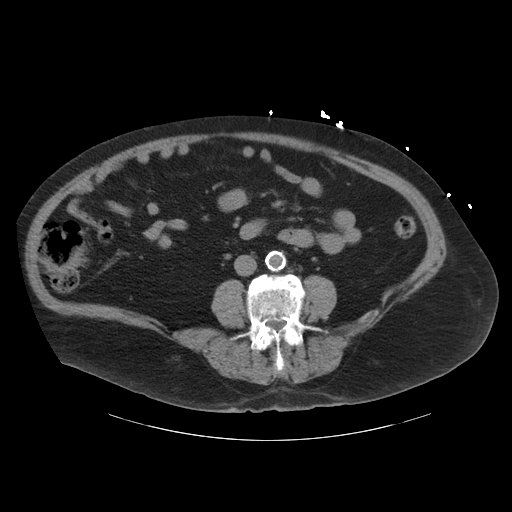
[im 71/102  soft-tissue]
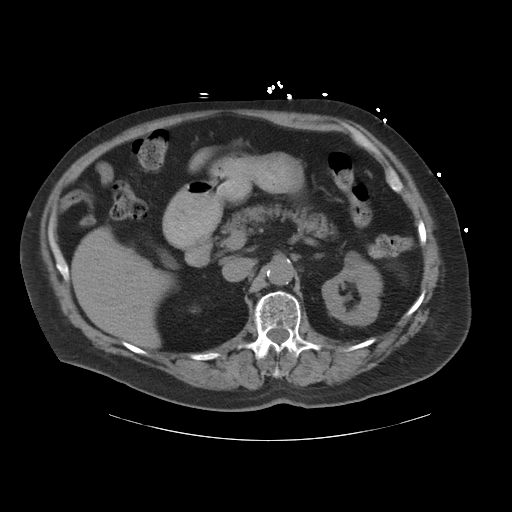
[im 88/102  soft-tissue]
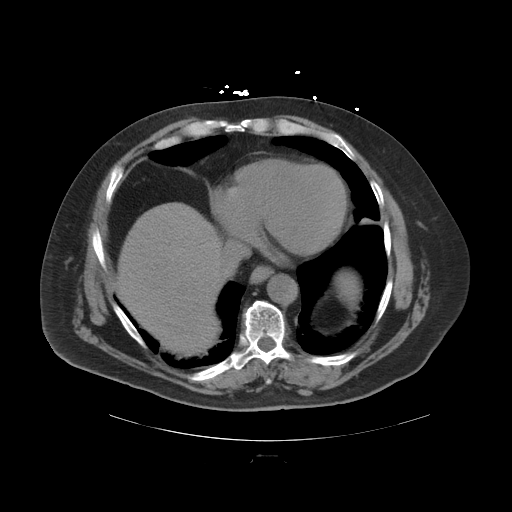

[Series 203: coronals, idose (2) · coronal · 0.45mm/px · 3 of 158 slices shown]
[im 53/158  soft-tissue]
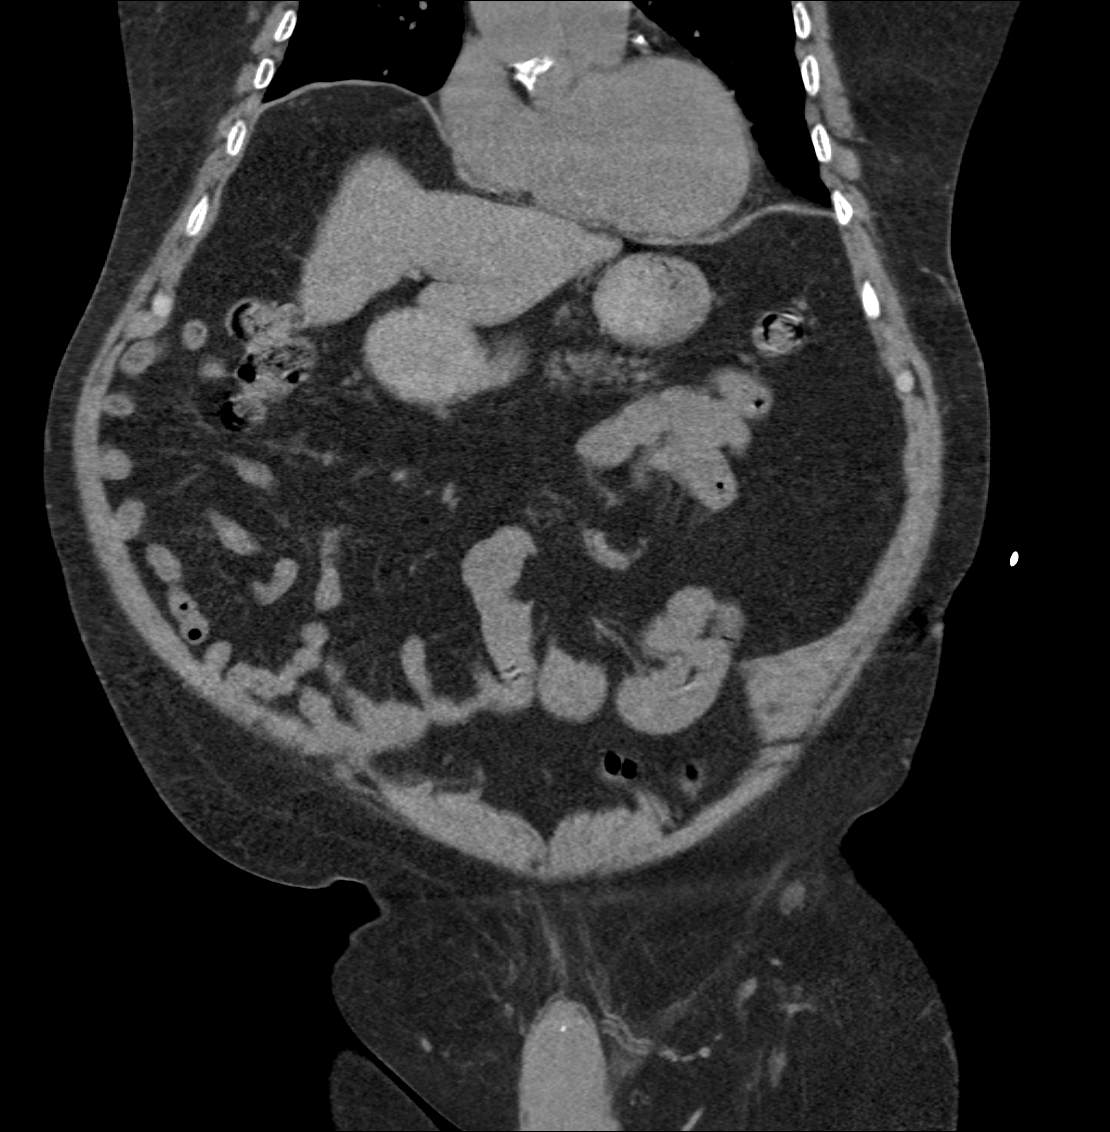
[im 70/158  soft-tissue]
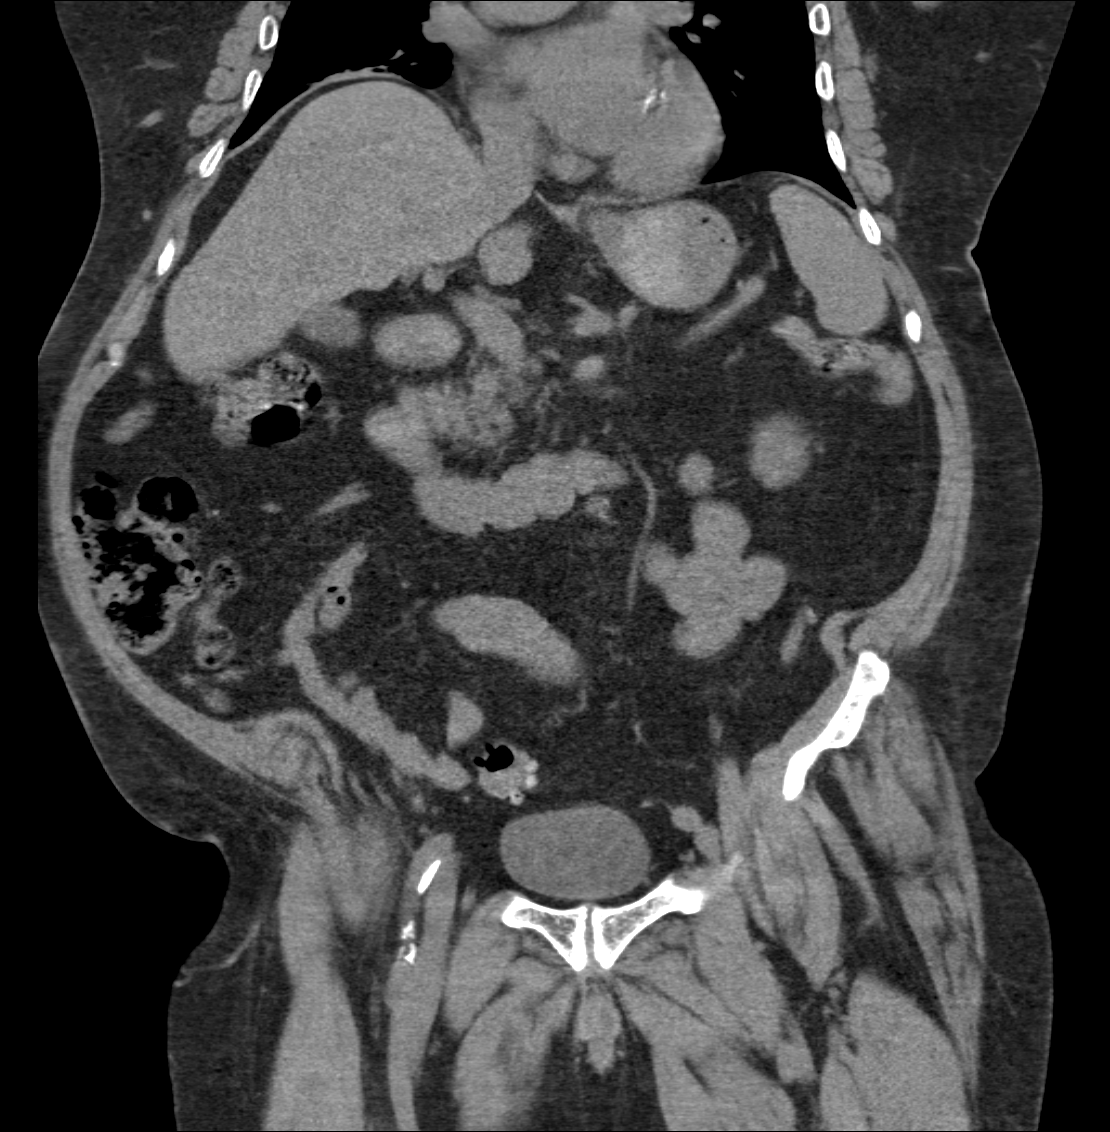
[im 88/158  soft-tissue]
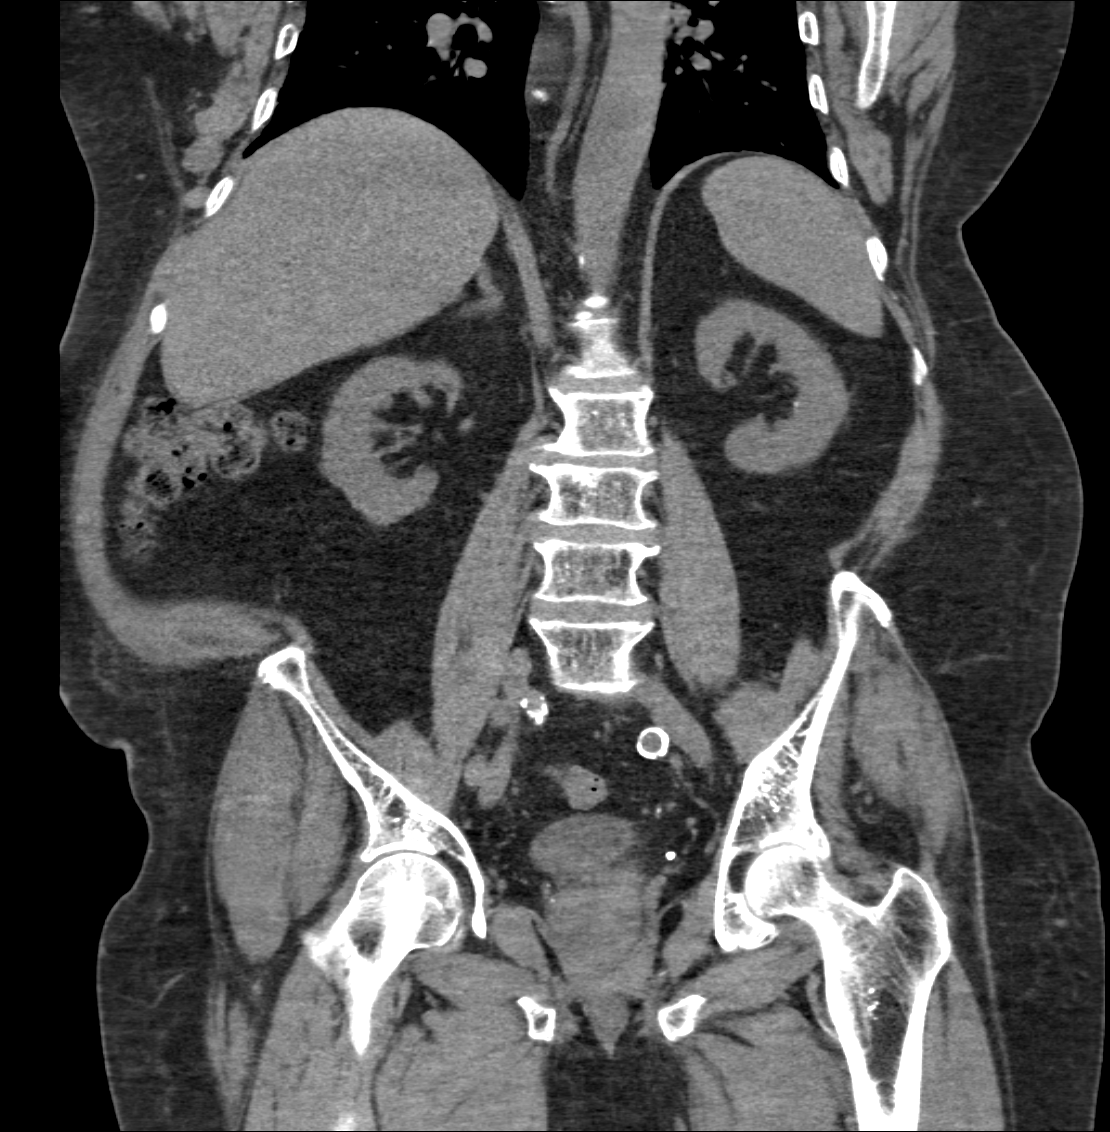

[8 of 46 positions shown; findings below may reference images not displayed]

FINDINGS: Lower chest: Dependent bibasilar atelectasis. Heart is normal size.
No effusions. Calcifications in the visualize coronary arteries,
mitral valve and aortic valve.

Hepatobiliary: No focal hepatic abnormality. Gallbladder
unremarkable.

Pancreas: No focal abnormality or ductal dilatation.

Spleen: No focal abnormality.  Normal size.

Adrenals/Urinary Tract: No adrenal abnormality. No focal renal
abnormality. No hydronephrosis. A few small punctate renal stones on
the left. Urinary bladder is unremarkable.

Stomach/Bowel: Diffuse aortic and iliac calcifications. No aneurysm
or adenopathy.

Vascular/Lymphatic: Sigmoid diverticulosis. No active
diverticulitis. Stomach and small bowel unremarkable. Appendix is
normal.

Reproductive: No visible focal abnormality.

Other: No free fluid or free air.

Musculoskeletal: No acute bony abnormality. Probable bilateral L5
pars defects with grade 1 anterolisthesis. Advanced degenerative
facet disease.
IMPRESSION: Punctate left nephrolithiasis. No ureteral stones or hydronephrosis.

Sigmoid diverticulosis.

Aortoiliac atherosclerosis.  Coronary artery disease.

No acute findings in the abdomen or pelvis.

## 2019-05-18 IMAGING — DX DG CHEST 1V
1 series · 1 of 1 positions shown · non-contrast
Comparison: 07/30/2016

CLINICAL DATA: Altered mental status and cough. Multiple falls over
the past few weeks including today.

EXAM:
CHEST 1 VIEW

[chest ap]
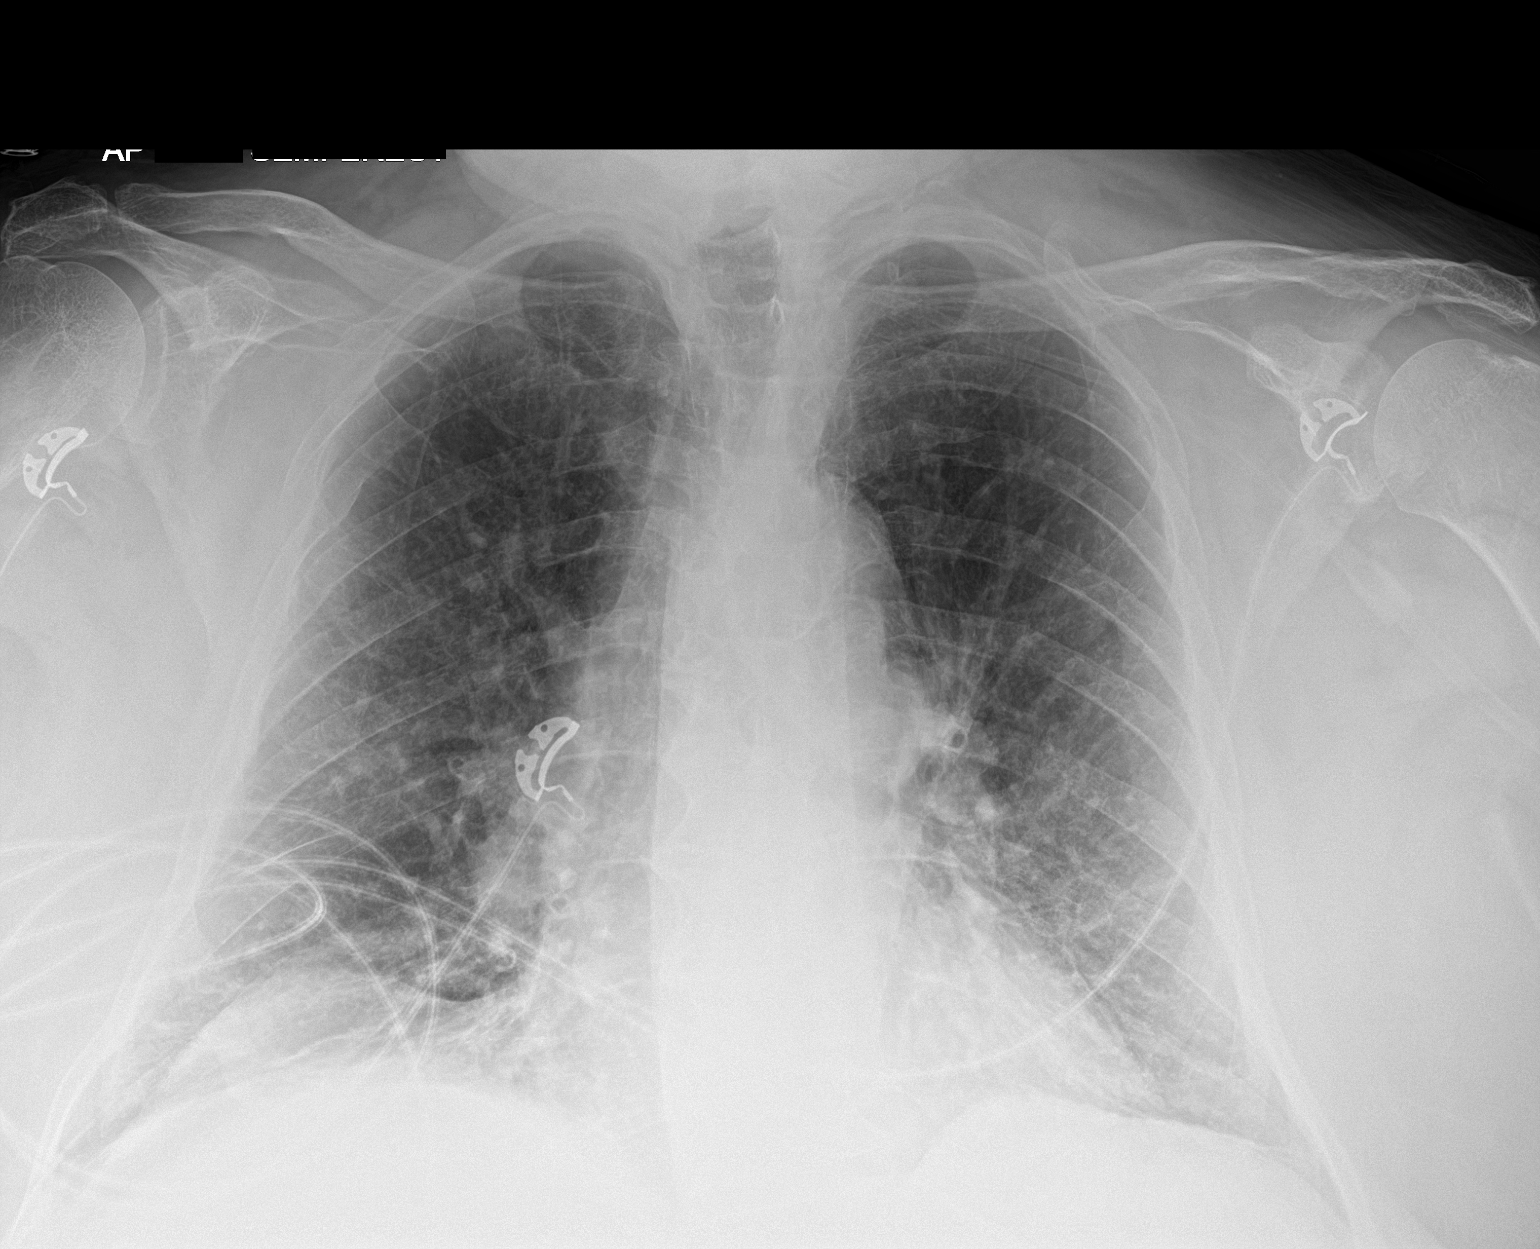

[1 of 1 positions shown; findings below may reference images not displayed]

FINDINGS: Shallow inspiration with linear atelectasis in the lung bases. Heart
size and pulmonary vascularity are normal for technique. No
consolidation or airspace disease. No blunting of costophrenic
angles. Old right rib fractures. Calcification of the aorta.
IMPRESSION: Atelectasis in the lung bases. Aortic atherosclerosis. No evidence
of active pulmonary disease.

## 2019-05-18 IMAGING — CT CT L SPINE W/O CM
3 series · 13 of 33 positions shown, 16 images · non-contrast
Comparison: Lumbar spine CT 03/30/2016

CLINICAL DATA: Fall with back pain

EXAM:
CT LUMBAR SPINE WITHOUT CONTRAST
TECHNIQUE: Multidetector CT imaging of the lumbar spine was performed without
intravenous contrast administration. Multiplanar CT image
reconstructions were also generated.

[Series 4: l-spine 2.0 st · axial · 0.43mm/px · z∈[-678,-476]mm · 5 of 147 slices shown, 7 images]
[im 23/147  soft-tissue]
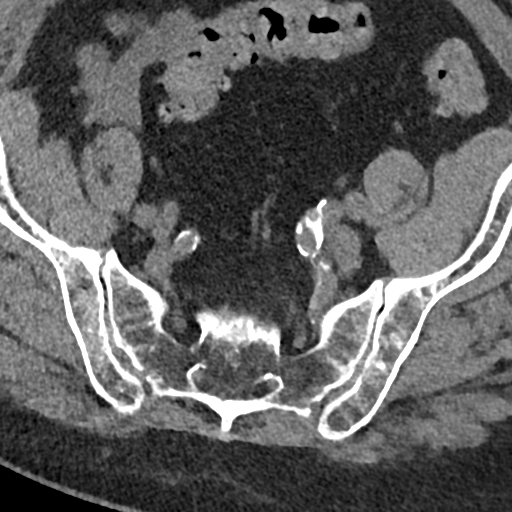
[im 23/147  bone]
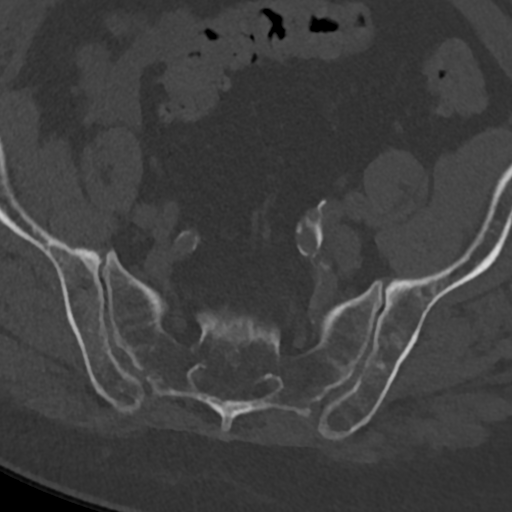
[im 45/147  bone]
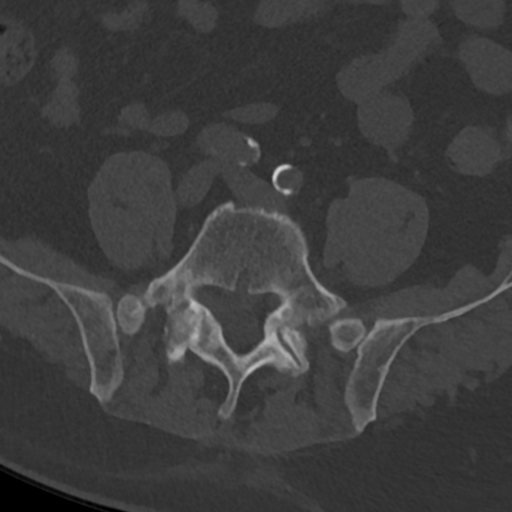
[im 79/147  bone]
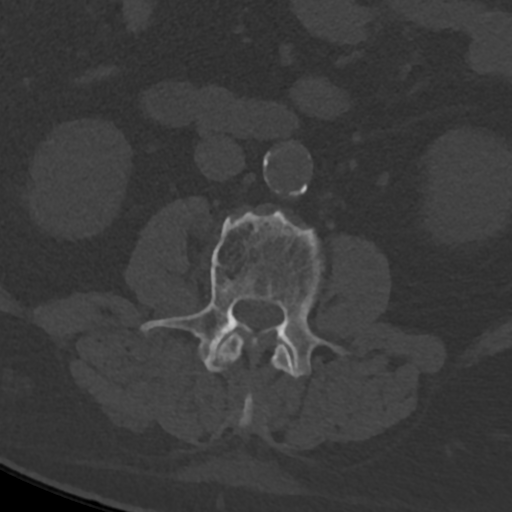
[im 102/147  bone]
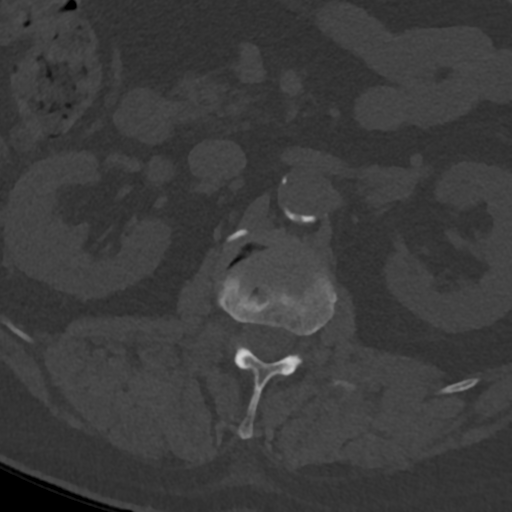
[im 124/147  soft-tissue]
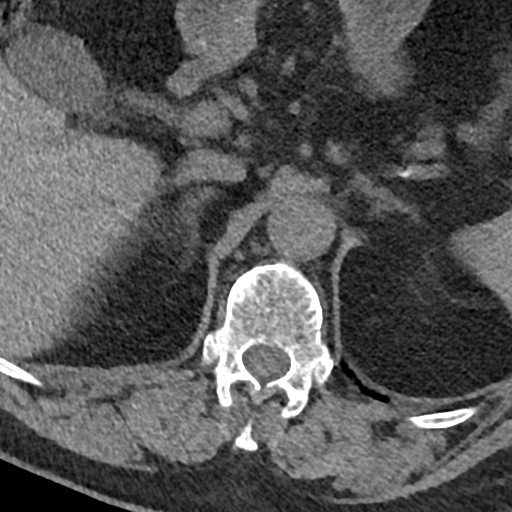
[im 124/147  bone]
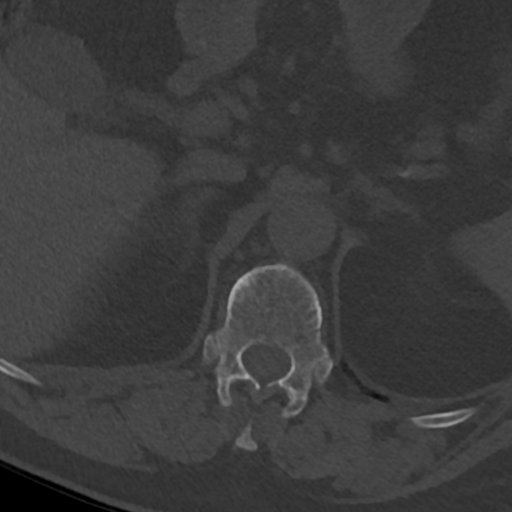

[Series 6: l-spine 2.0 cor bone · coronal · 0.43mm/px · 3 of 61 slices shown]
[im 13/61  bone]
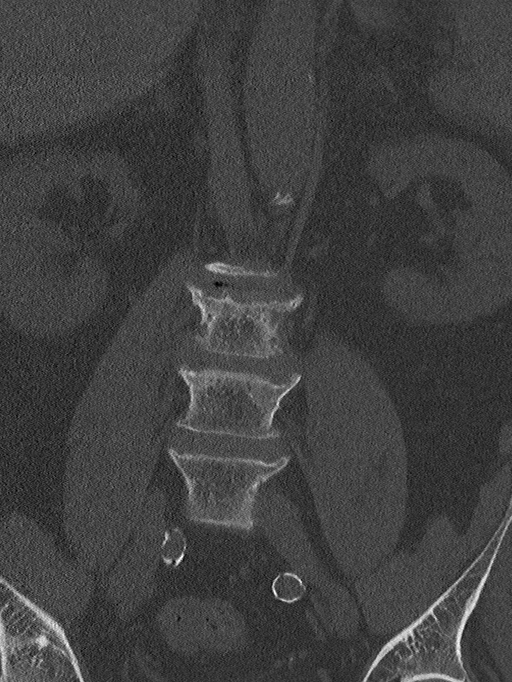
[im 25/61  bone]
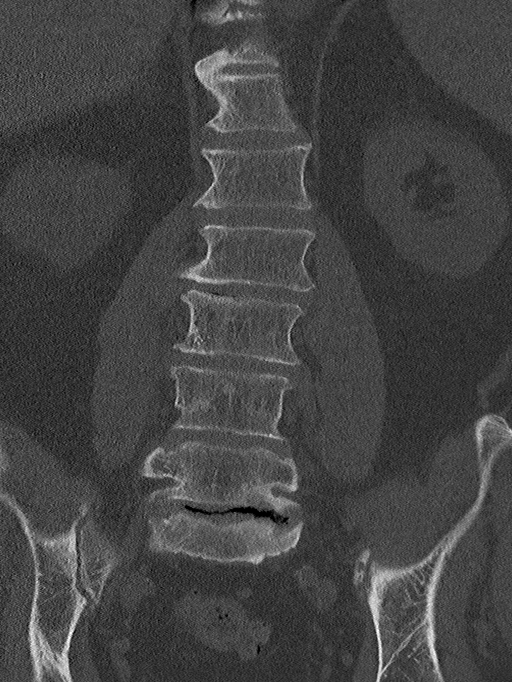
[im 37/61  bone]
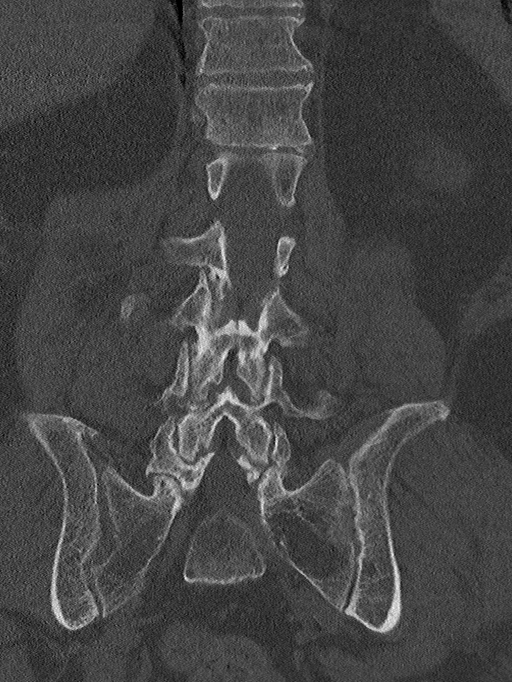

[Series 7: l-spine 2.0 sag bone · sagittal · 0.43mm/px · 5 of 83 slices shown, 6 images]
[im 28/83  bone]
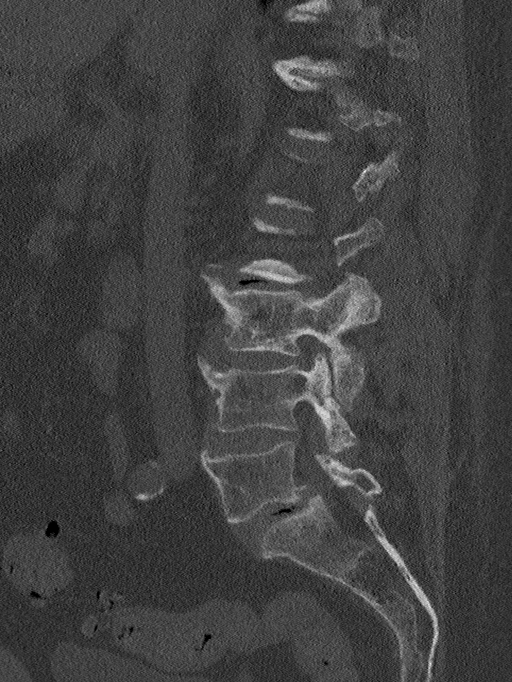
[im 35/83  bone]
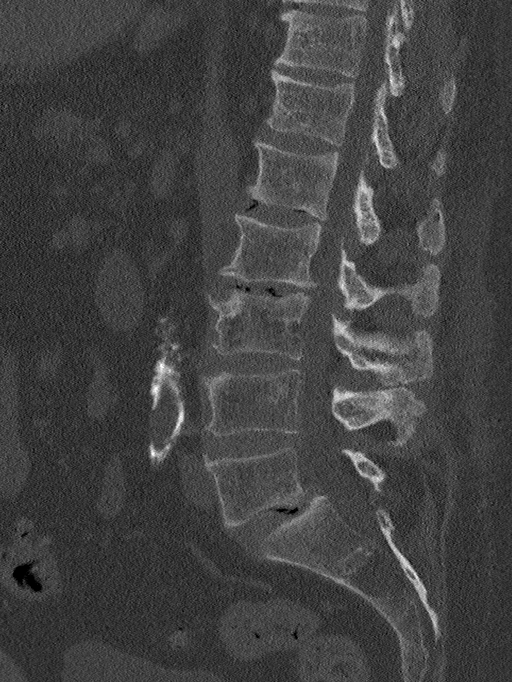
[im 42/83  soft-tissue]
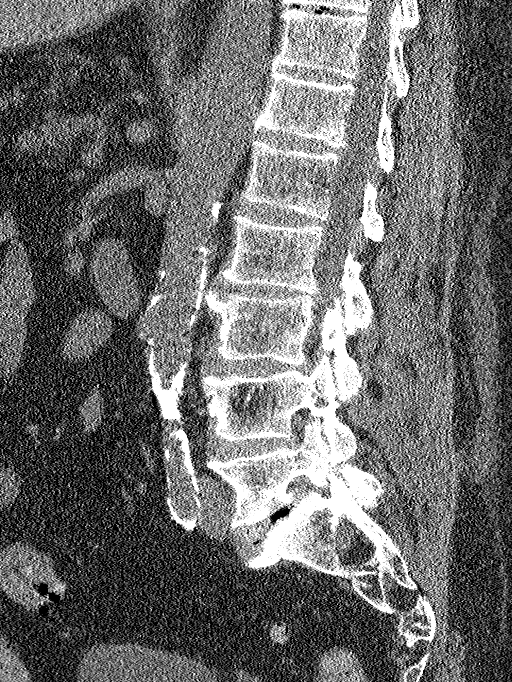
[im 42/83  bone]
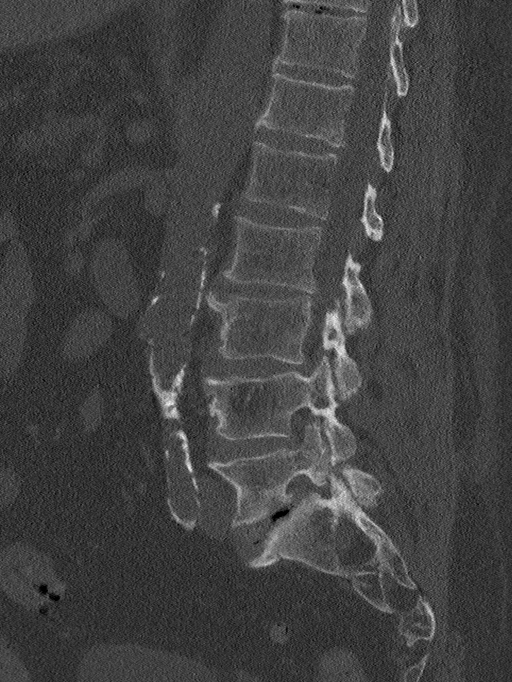
[im 48/83  bone]
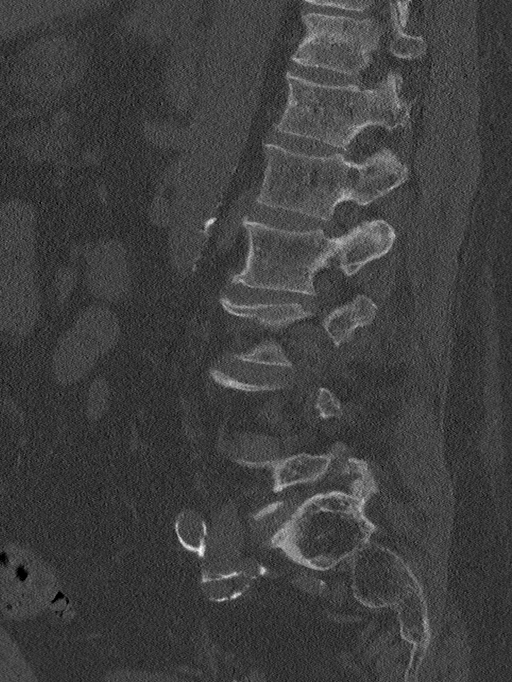
[im 55/83  bone]
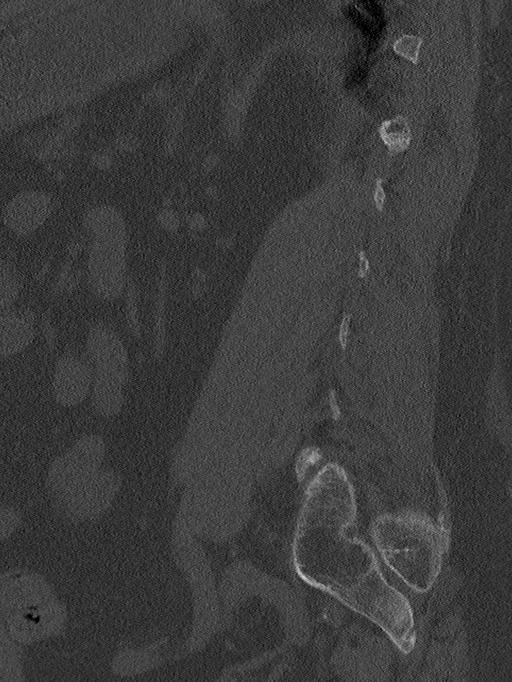

[13 of 33 positions shown; findings below may reference images not displayed]

FINDINGS: Segmentation: 5 lumbar type vertebrae.

Alignment: Grade 1 L5-S1 anterolisthesis secondary to severe facet
arthrosis and left pars interarticularis defect, unchanged.

Vertebrae: No acute fracture or focal pathologic process.

Paraspinal and other soft tissues: There is atherosclerotic
calcification of the non aneurysmal abdominal aorta. Rectosigmoid
diverticulosis.

Disc levels: There is no bony spinal canal stenosis. There is severe
right and moderate left neural foraminal stenosis at L2-L3 due to
partially calcified disc bulge and endplate osteophyte formation.
There is severe bilateral L5-S1 foraminal stenosis, unchanged.
IMPRESSION: 1. No acute fracture of the lumbar spine.
2. Unchanged grade 1 L5-S1 anterolisthesis with severe bilateral
neural foraminal stenosis.
3. Severe right and moderate left L2-L3 neural foraminal stenosis.

## 2019-05-18 IMAGING — DX DG WRIST COMPLETE 3+V*R*
4 series · 4 of 4 positions shown · non-contrast
Comparison: 11/16/2015

CLINICAL DATA: Pain post fall, multiple falls over the past few
weeks, chronic wrist pain since a fracture in October 2015

EXAM:
RIGHT WRIST - COMPLETE 3+ VIEW

[wrist pa]
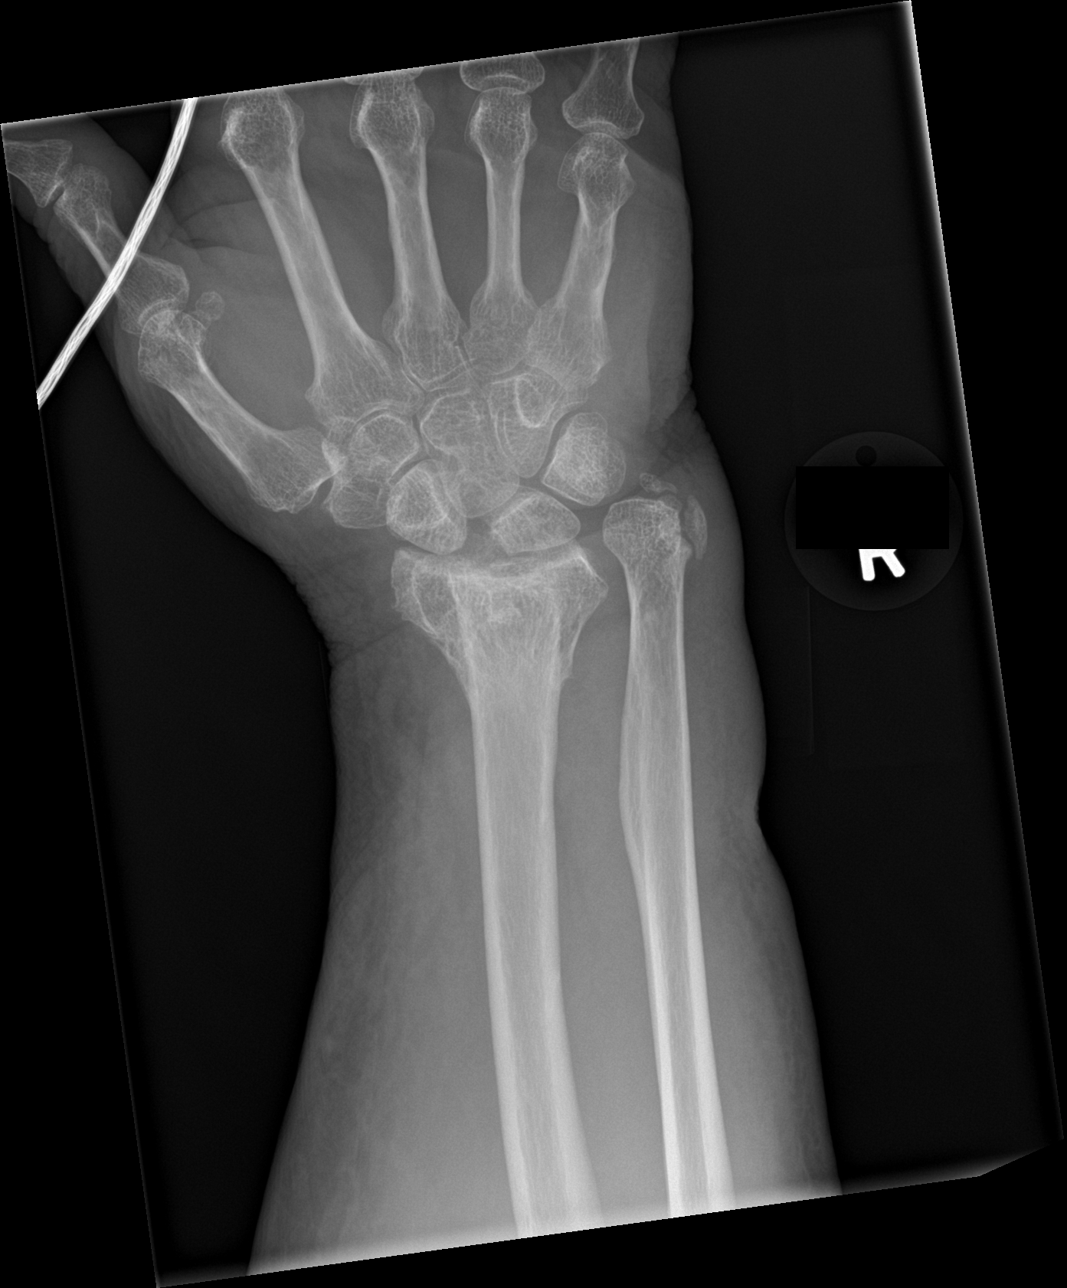

[wrist obl]
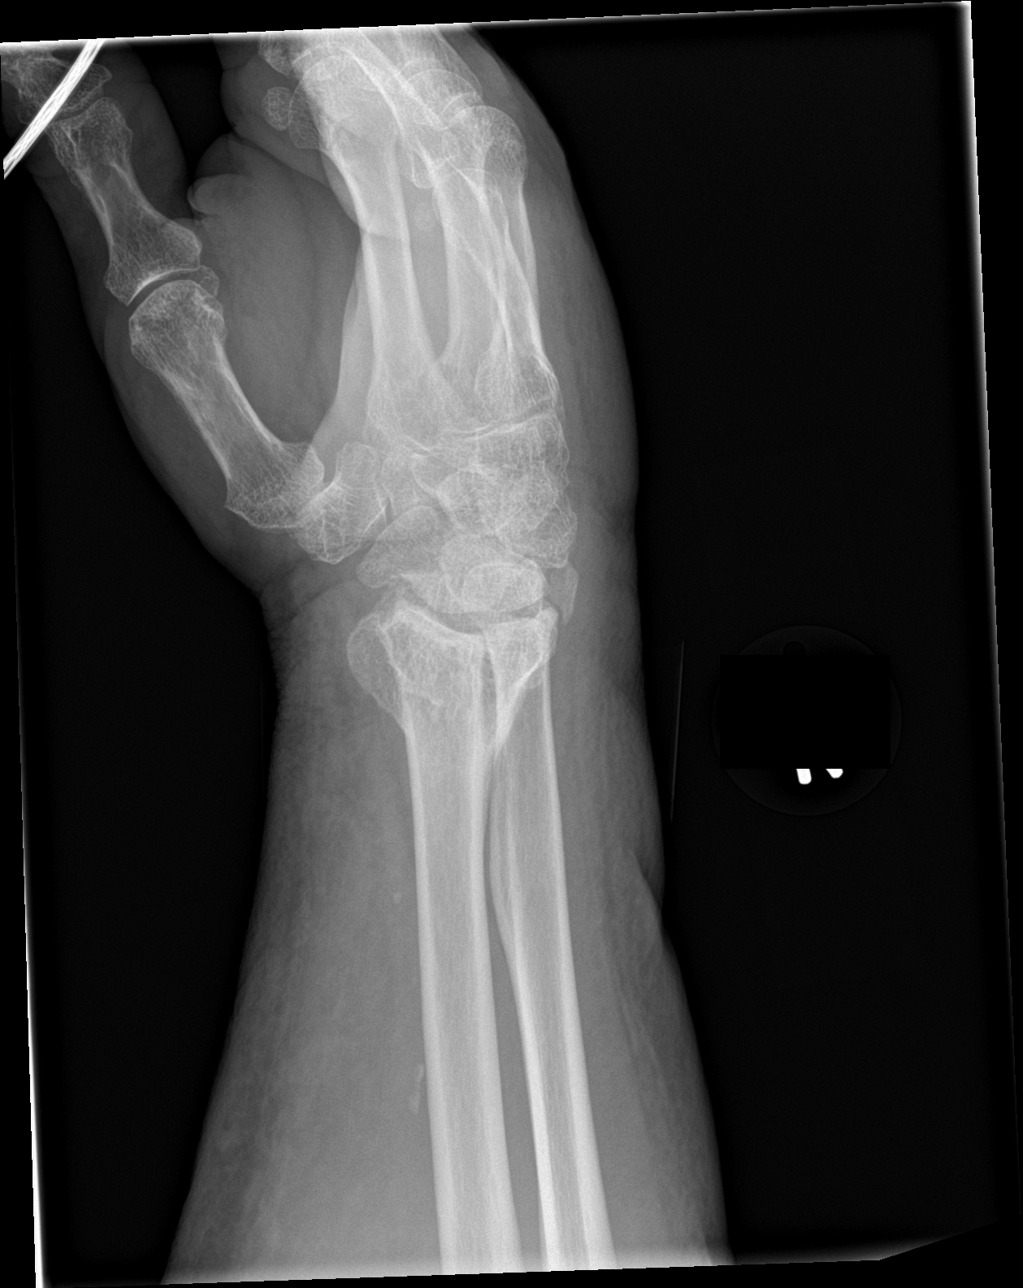

[wrist lat]
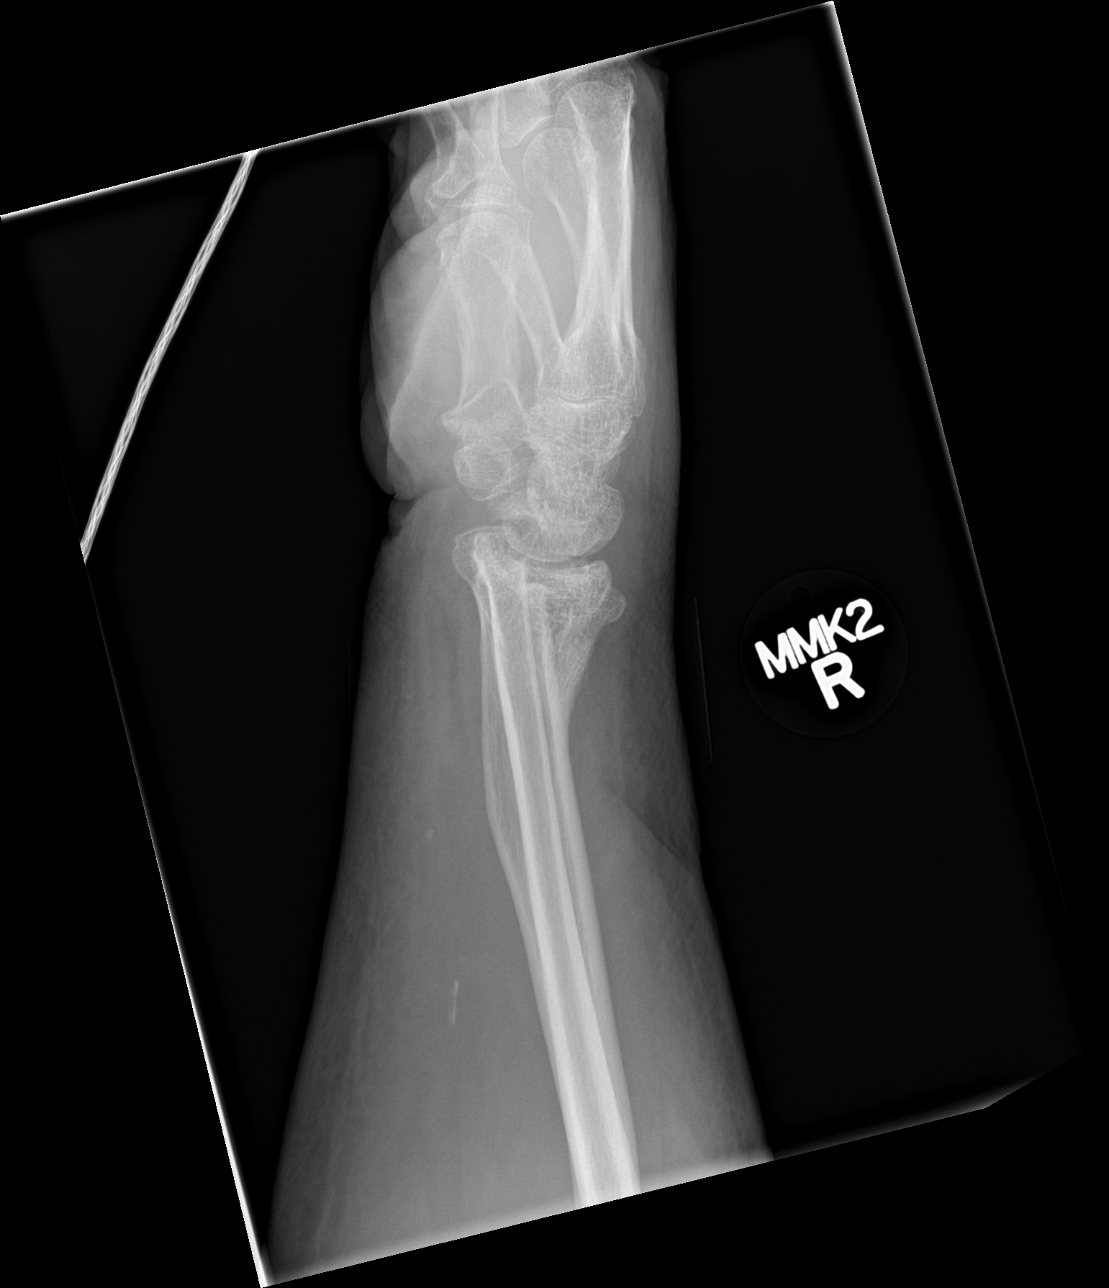

[wrist navicular]
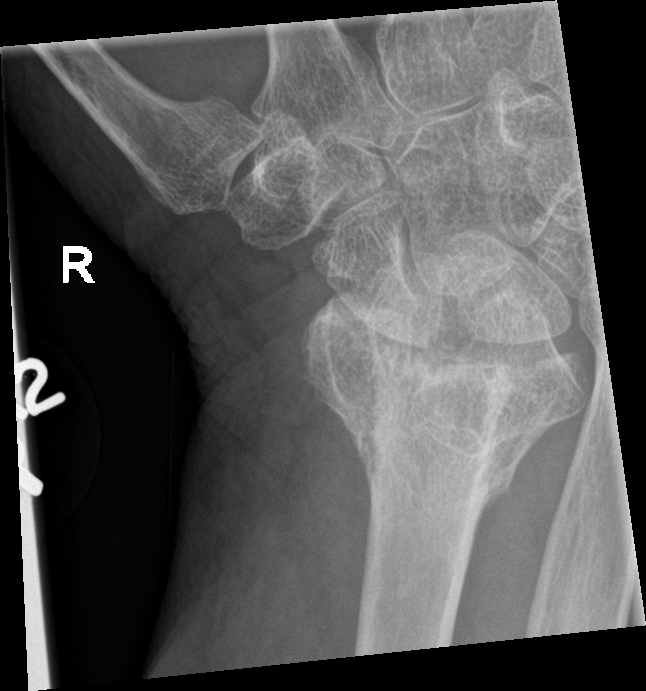

[4 of 4 positions shown; findings below may reference images not displayed]

FINDINGS: Osseous demineralization.

Marked deformity of the distal RIGHT radius secondary to interval
healing of previously identified comminuted displaced
intra-articular distal radial metaphyseal fracture.

Acquired ulnar plus variance.

Ununited fracture fragments at distal ulna and ulnar styloid
process.

Radiocarpal joint space narrowing.

Widening of the scapholunate interval consistent with torn
scapholunate ligament.

No acute fracture, dislocation, or bone destruction.
IMPRESSION: Significant deformity of the distal radius and to a lesser degree
ulna from previously identified fractures.

Acquired ulnar plus variance.

Torn scapholunate ligament.

Mild radiocarpal degenerative changes and diffuse osseous
demineralization.

## 2019-05-18 IMAGING — DX DG KNEE COMPLETE 4+V*L*
4 series · 4 of 4 positions shown · non-contrast
Comparison: None.

CLINICAL DATA: Altered mental status and cough.  Patient fell.

EXAM:
LEFT KNEE - COMPLETE 4+ VIEW

[knee ap]
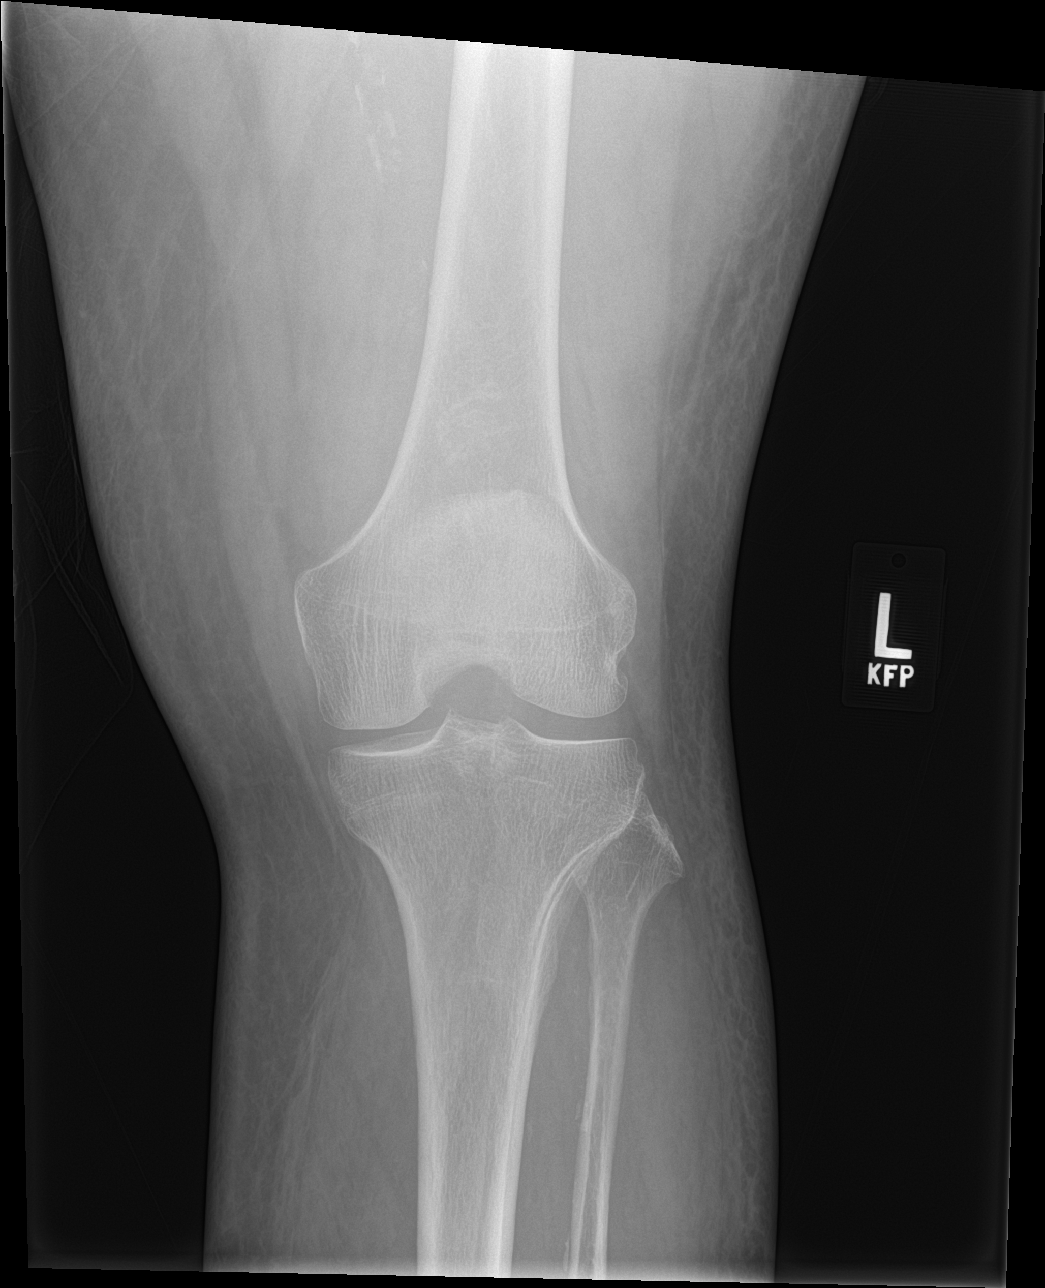

[knee lat]
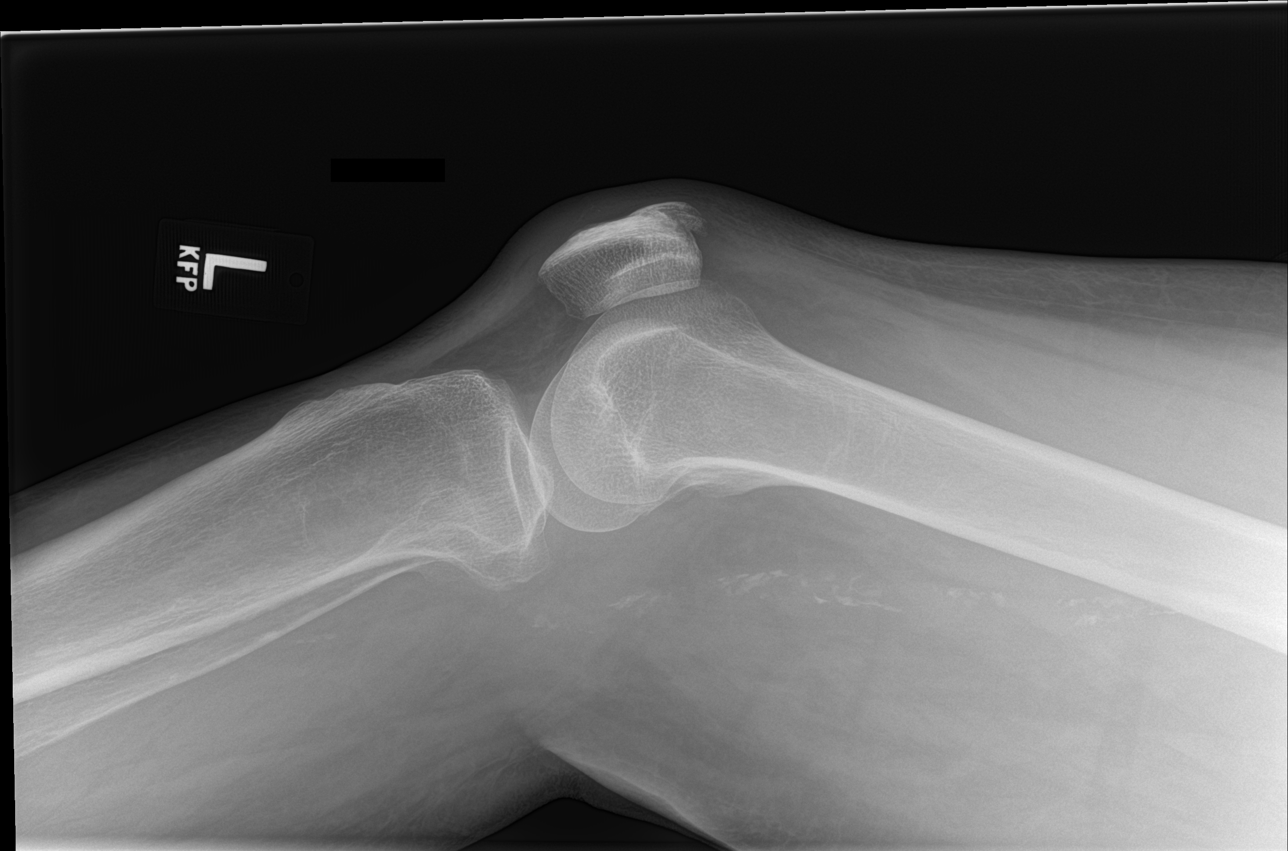

[knee obl (1 of 2)]
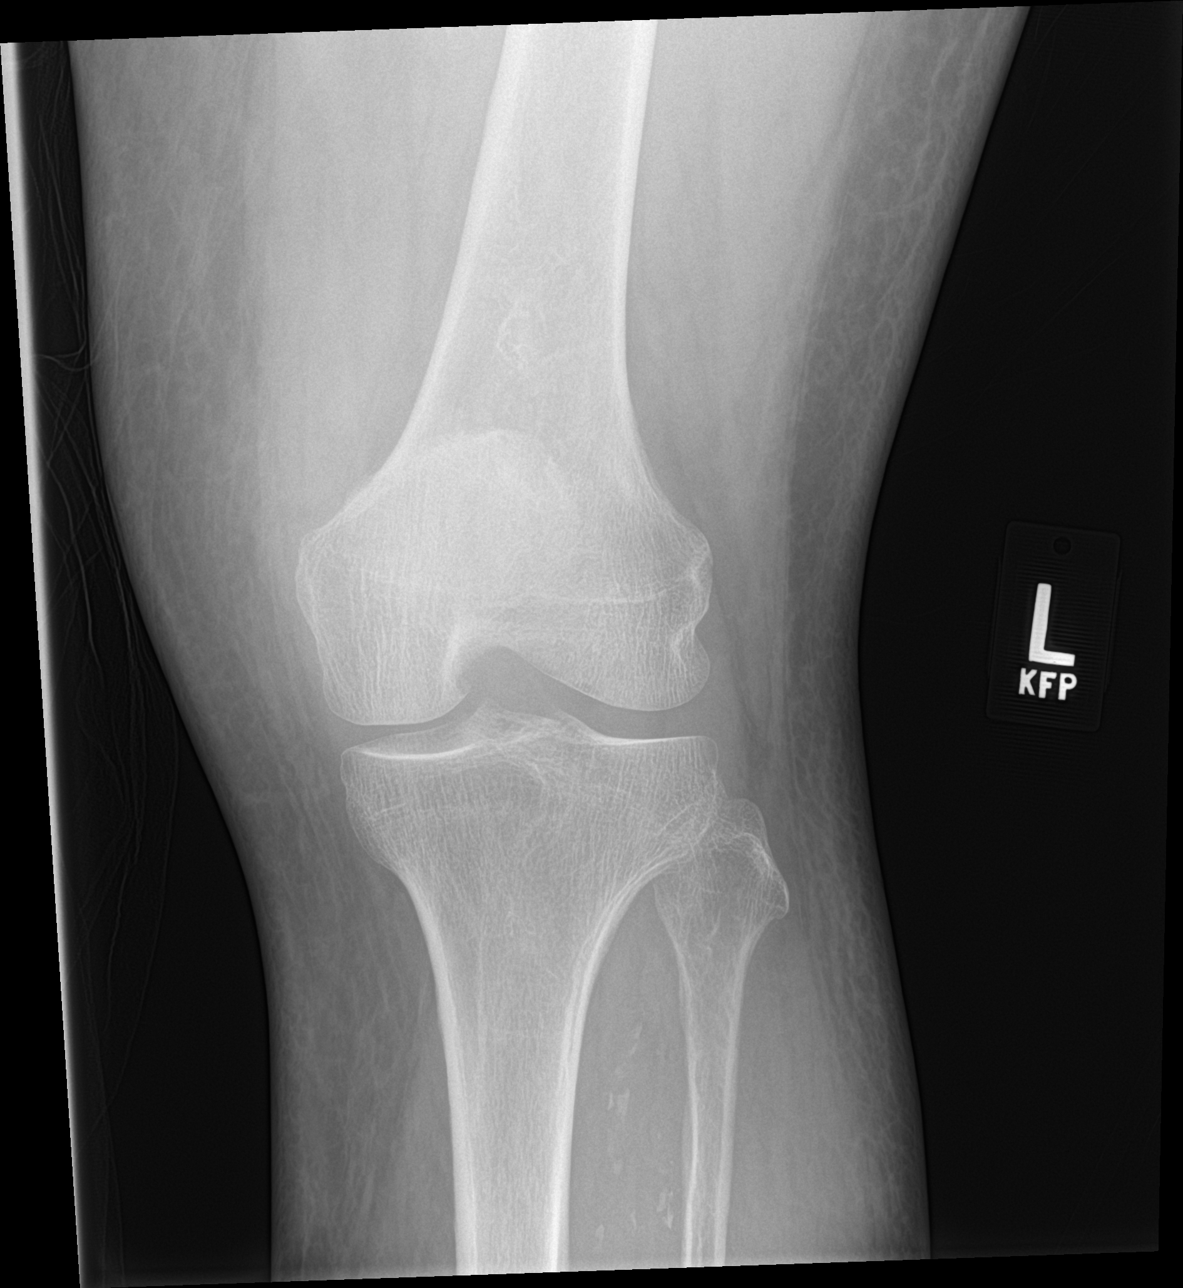

[knee obl (2 of 2)]
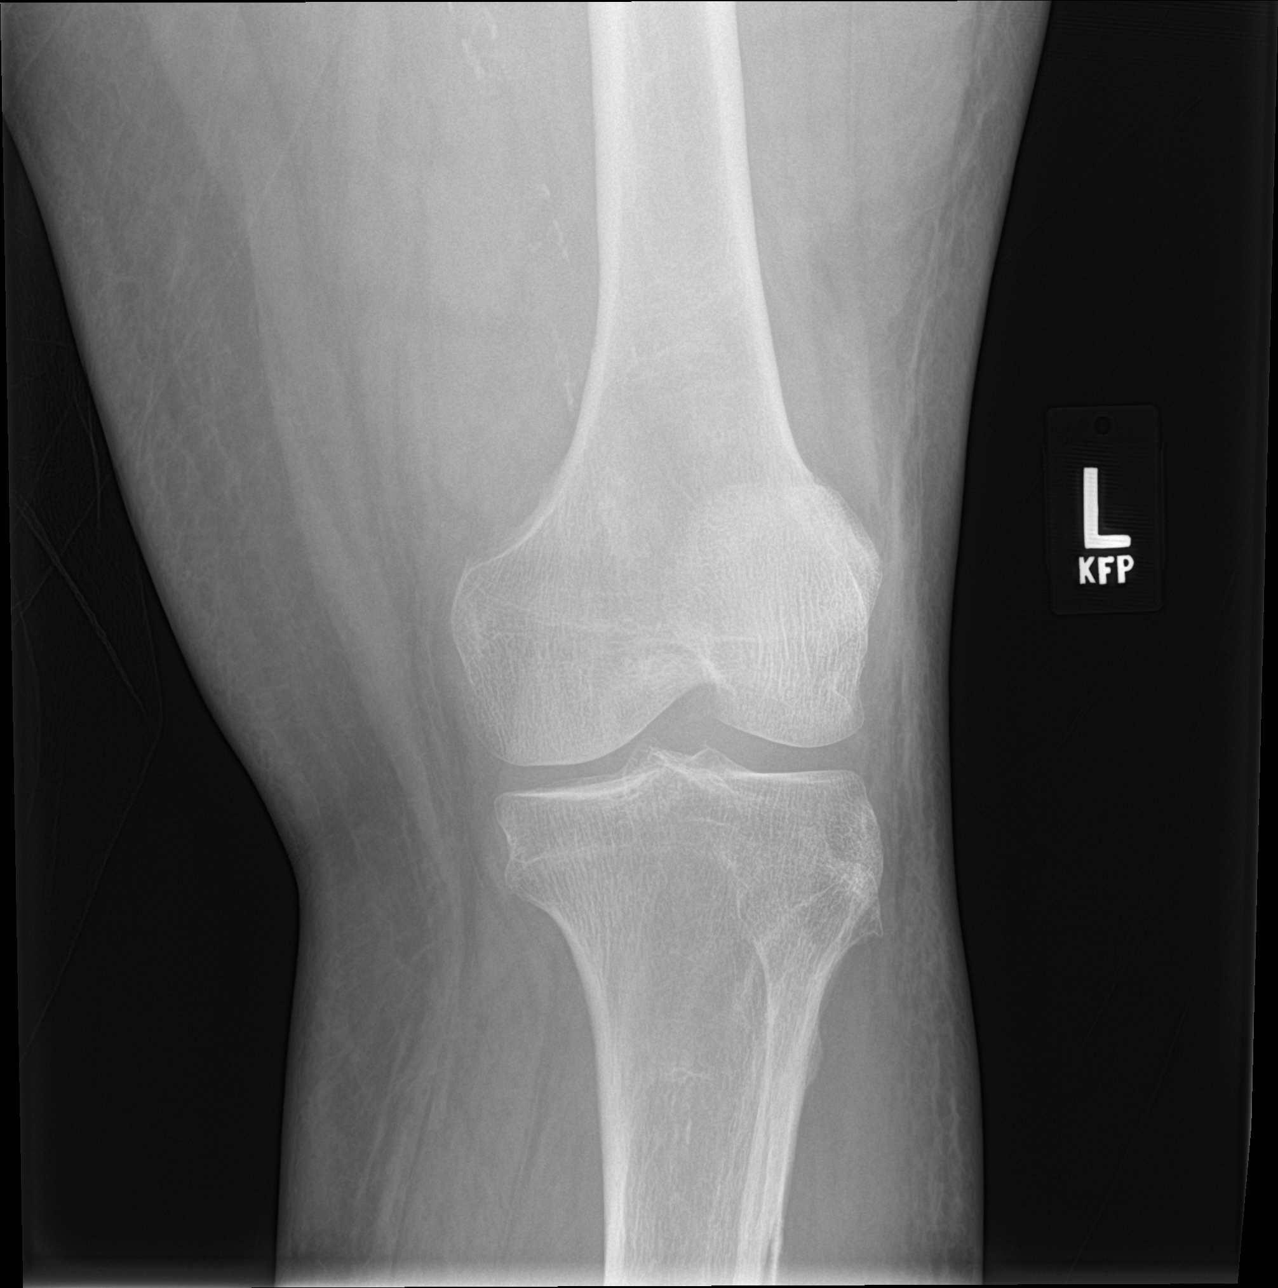

[4 of 4 positions shown; findings below may reference images not displayed]

FINDINGS: No evidence of fracture, dislocation, or joint effusion. No evidence
of arthropathy or other focal bone abnormality. Vascular
calcifications.
IMPRESSION: No acute bony abnormalities.

## 2019-05-18 IMAGING — CT CT HEAD W/O CM
4 series · 16 of 47 positions shown, 18 images · non-contrast
Comparison: 11/14/2015 CT.

CLINICAL DATA: Patient fell several times over the past several
weeks. Ataxia.

EXAM:
CT HEAD WITHOUT CONTRAST
TECHNIQUE: Contiguous axial images were obtained from the base of the skull
through the vertex without intravenous contrast.

[Series 3: head without · axial · non-contrast · 0.46mm/px · z∈[-88,+57]mm · 7 of 39 slices shown, 9 images]
[im 5/39  brain]
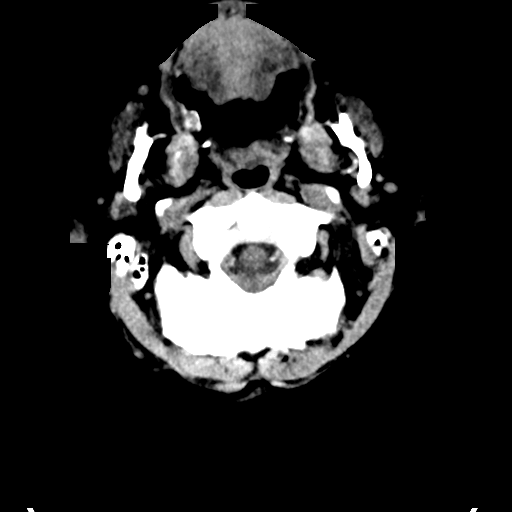
[im 5/39  bone]
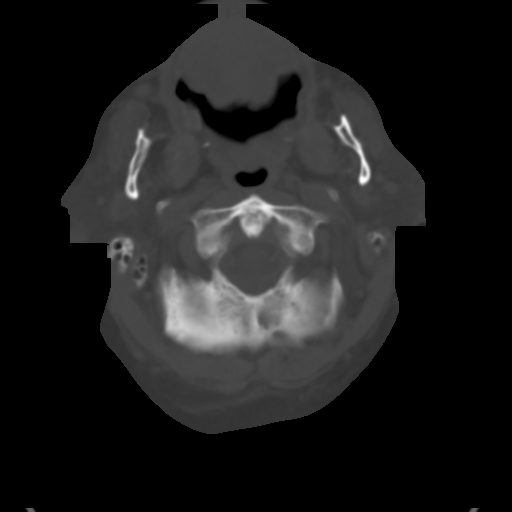
[im 10/39  brain]
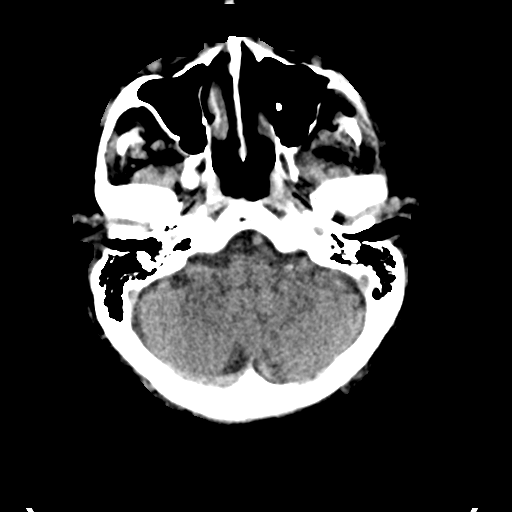
[im 15/39  brain]
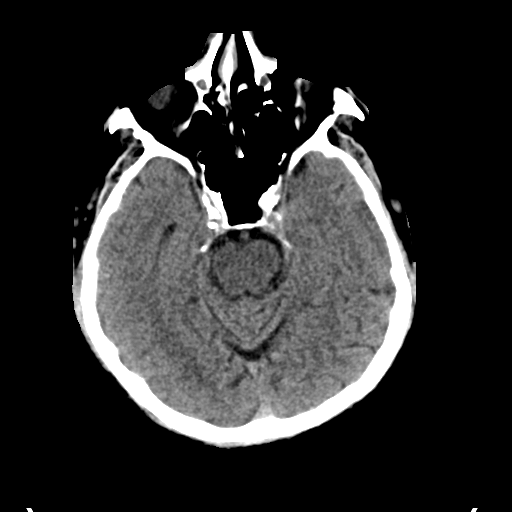
[im 20/39  brain]
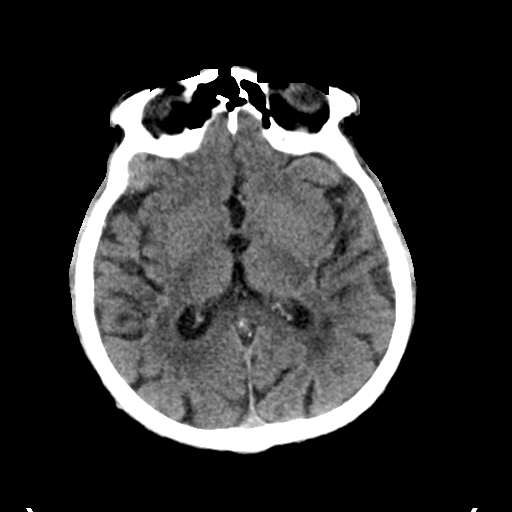
[im 24/39  brain]
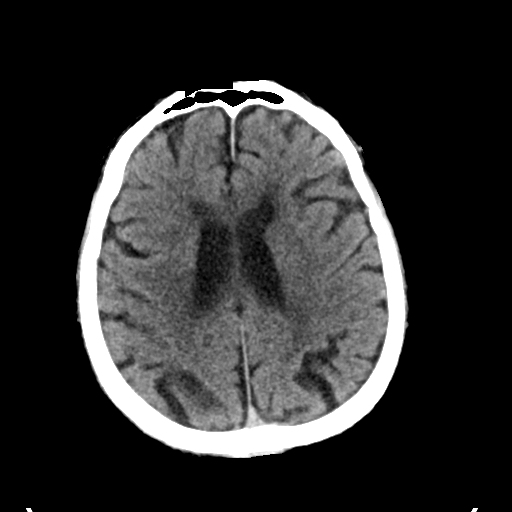
[im 24/39  bone]
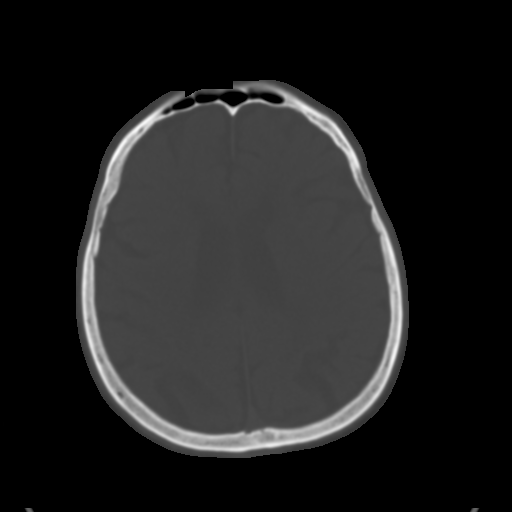
[im 29/39  brain]
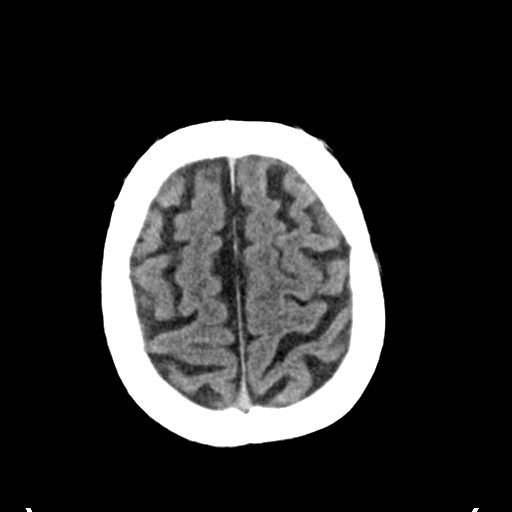
[im 34/39  brain]
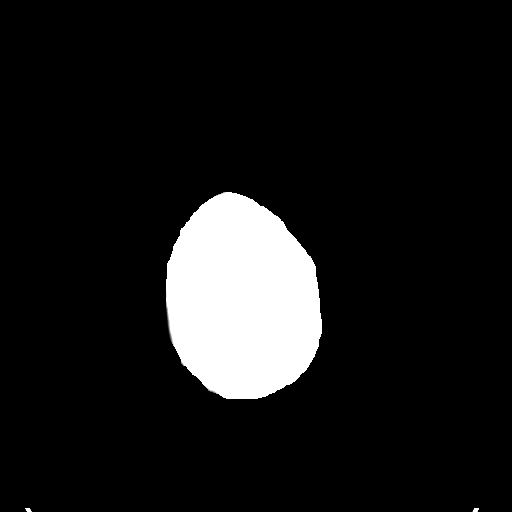

[Series 4: head bone · axial · 0.46mm/px · z∈[-90,-52]mm · 3 of 96 slices shown]
[im 10/96  bone]
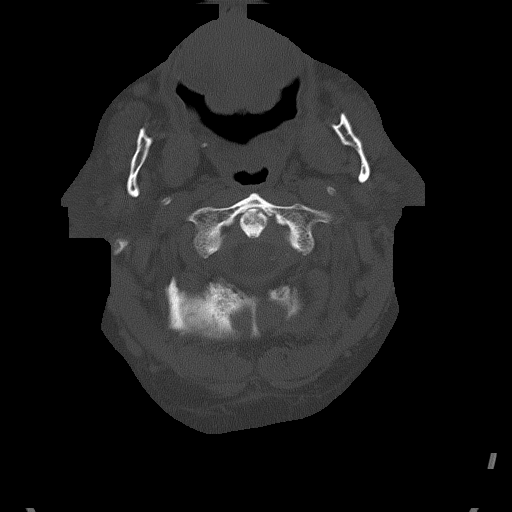
[im 20/96  bone]
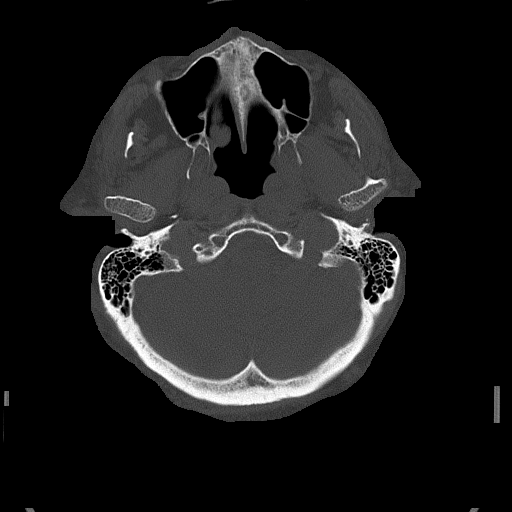
[im 29/96  bone]
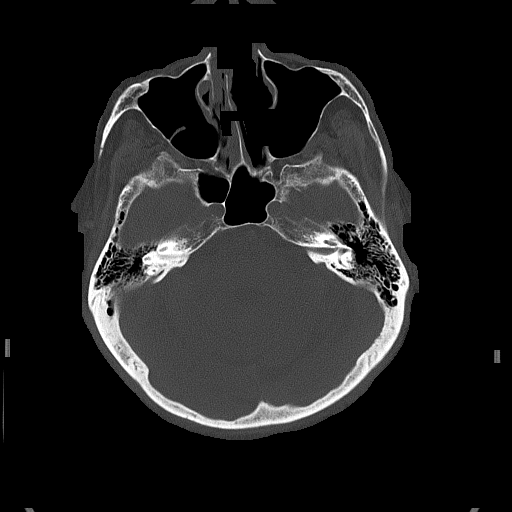

[Series 5: head without cor · coronal · non-contrast · 0.35mm/px · 3 of 81 slices shown]
[im 27/81  brain]
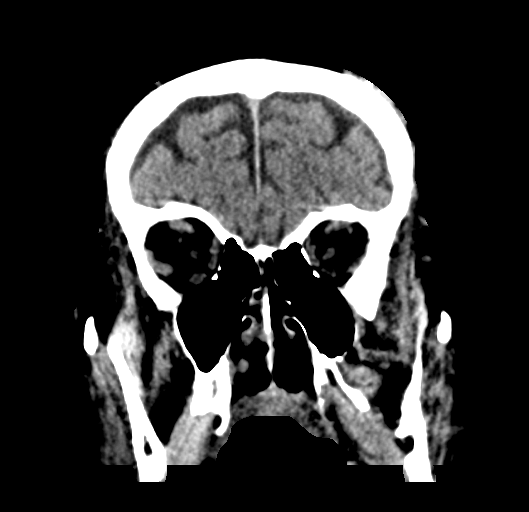
[im 36/81  brain]
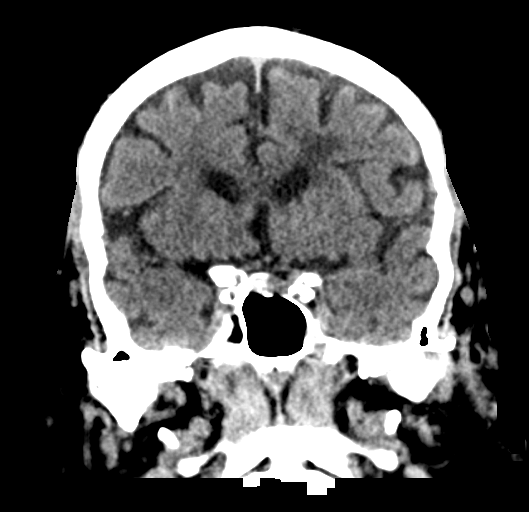
[im 45/81  brain]
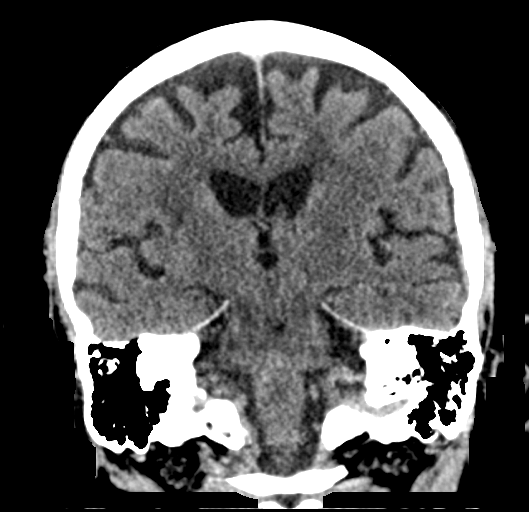

[Series 6: head without sag · sagittal · non-contrast · 0.37mm/px · 3 of 67 slices shown]
[im 23/67  brain]
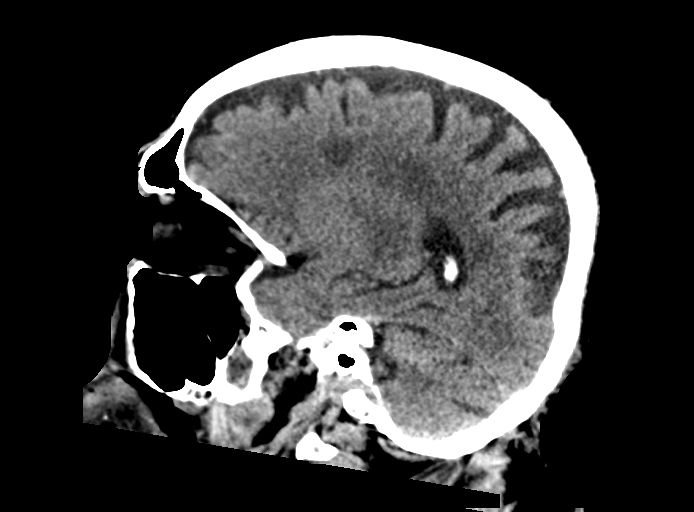
[im 34/67  brain]
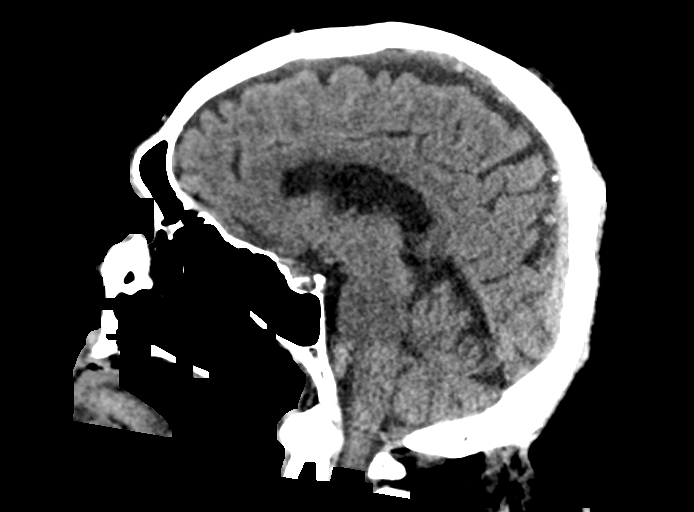
[im 45/67  brain]
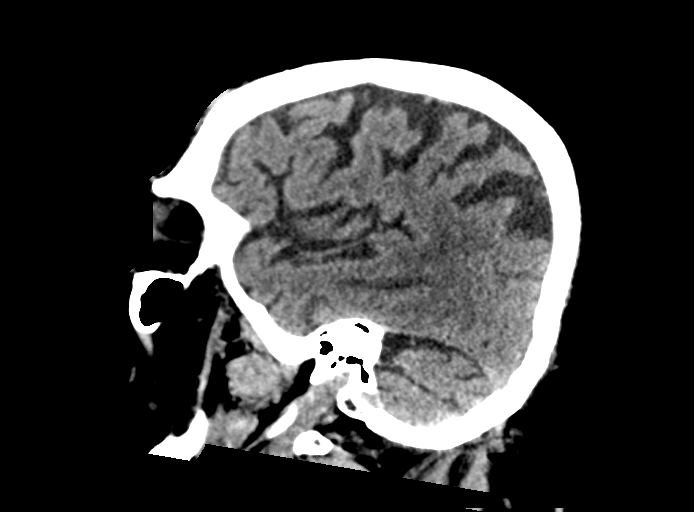

[16 of 47 positions shown; findings below may reference images not displayed]

FINDINGS: BRAIN: There is mild sulcal and ventricular prominence consistent
with superficial and central atrophy. No intraparenchymal
hemorrhage, mass effect nor midline shift. Periventricular and
subcortical white matter hypodensities consistent with chronic small
vessel ischemic disease are identified. No acute large vascular
territory infarcts. No abnormal extra-axial fluid collections. Basal
cisterns are not effaced and midline.

VASCULAR: Moderate calcific atherosclerosis of the carotid siphons.

SKULL: No skull fracture. No significant scalp soft tissue swelling.

SINUSES/ORBITS: The mastoid air-cells are clear. The included
paranasal sinuses are well-aerated.The included ocular globes and
orbital contents are non-suspicious. Status post left cataract
extraction.

OTHER: None.
IMPRESSION: Stable cerebral atrophy with chronic small vessel ischemia. No acute
intracranial abnormality.

## 2019-05-18 IMAGING — CT CT PELVIS W/O CM
2 of 3 series · 16 of 46 positions shown, 18 images · non-contrast
Comparison: 03/30/2016 CT

CLINICAL DATA: Patient has sustained multiple falls for the past
few weeks with pelvic trauma. Decreased mobility. Question fracture.

EXAM:
CT PELVIS WITHOUT CONTRAST
TECHNIQUE: Multidetector CT imaging of the pelvis was performed following the
standard protocol without intravenous contrast.

[Series 5: pelvis 2.0 st · axial · 0.82mm/px · z∈[-829,-613]mm · 13 of 124 slices shown, 15 images]
[im 8/124  soft-tissue]
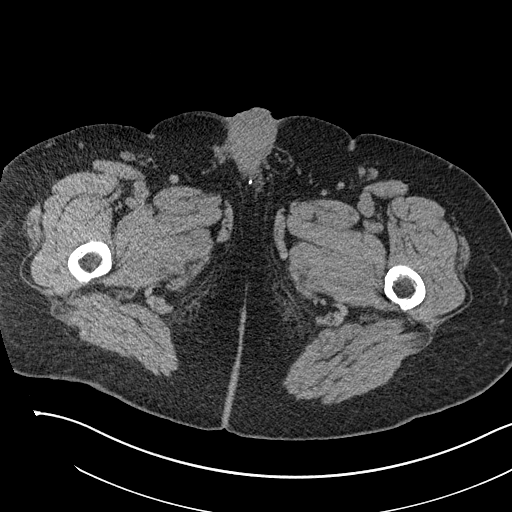
[im 8/124  bone]
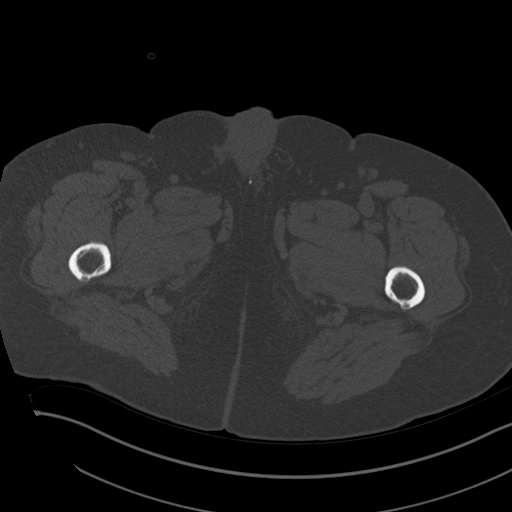
[im 16/124  soft-tissue]
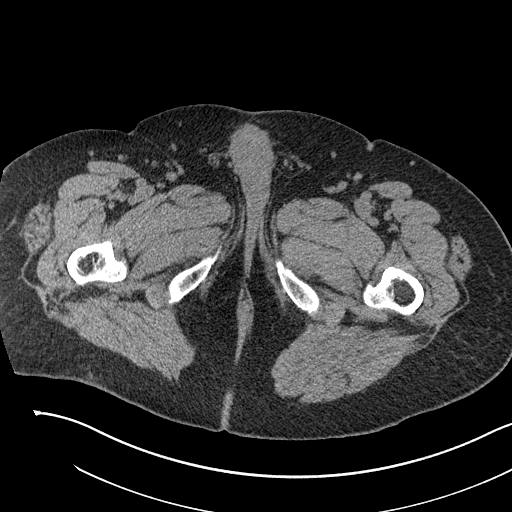
[im 24/124  soft-tissue]
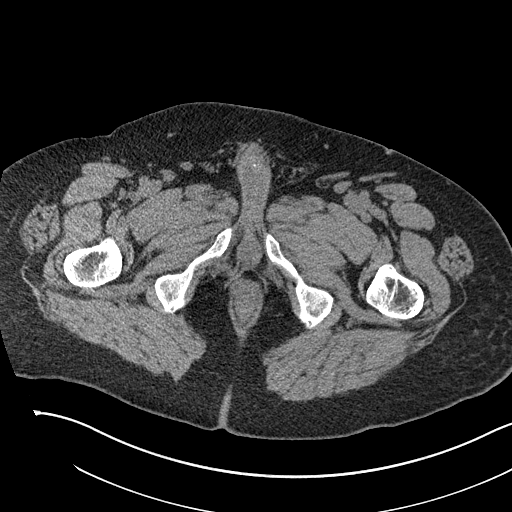
[im 36/124  soft-tissue]
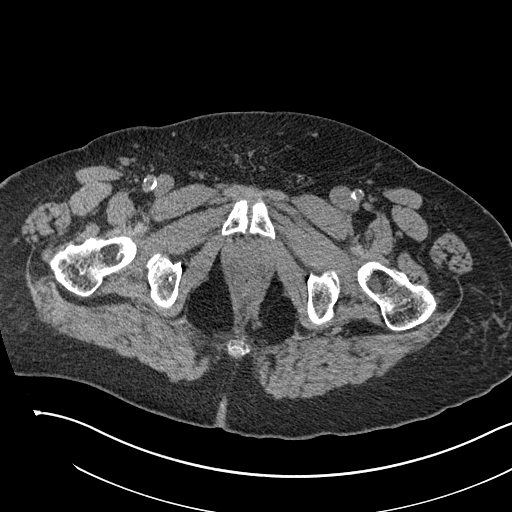
[im 44/124  soft-tissue]
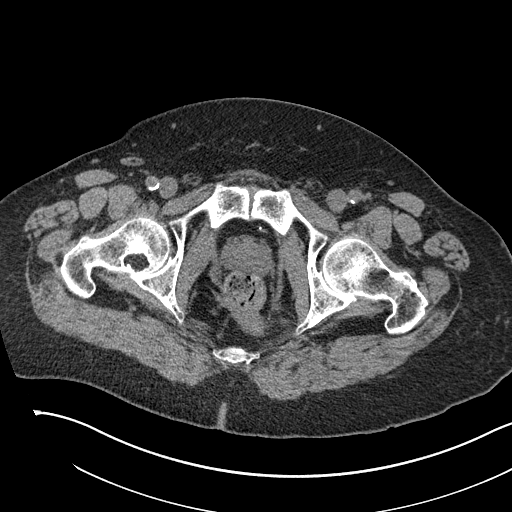
[im 52/124  soft-tissue]
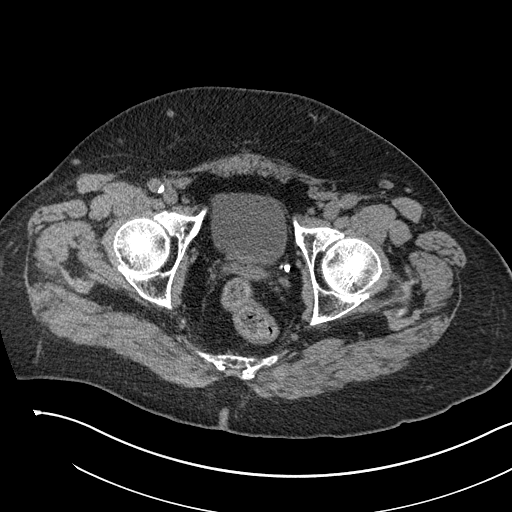
[im 64/124  soft-tissue]
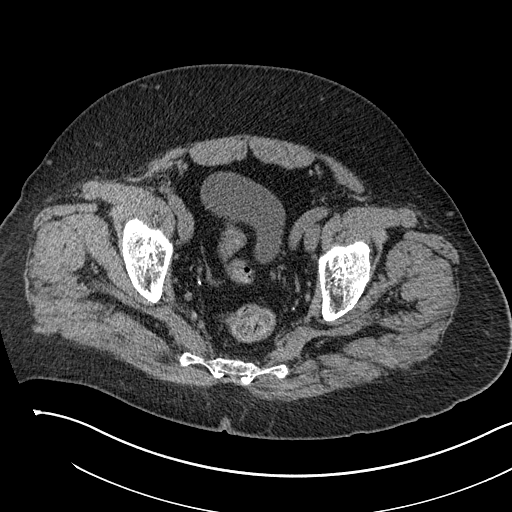
[im 72/124  soft-tissue]
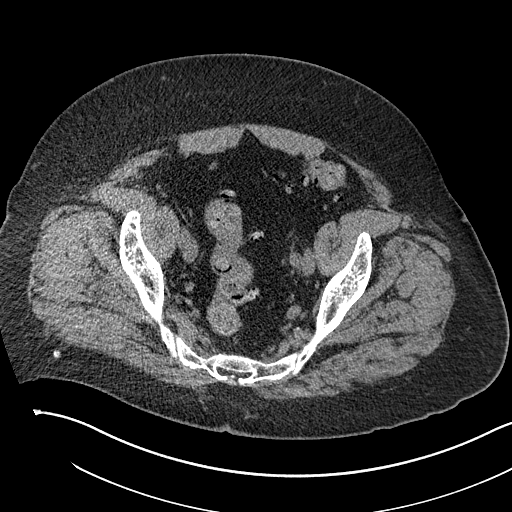
[im 80/124  soft-tissue]
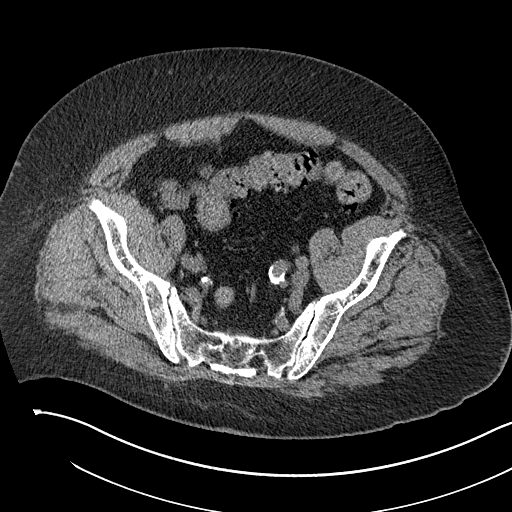
[im 80/124  bone]
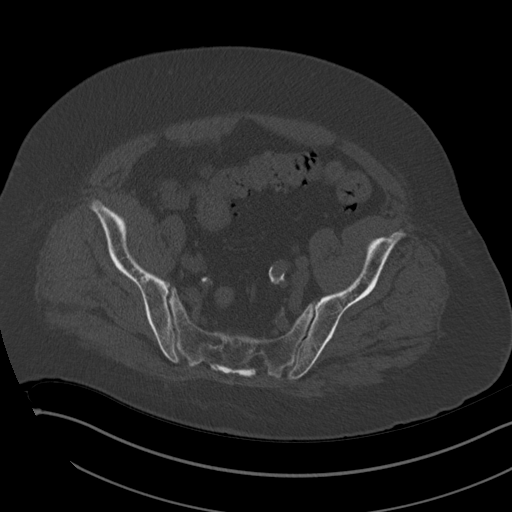
[im 88/124  soft-tissue]
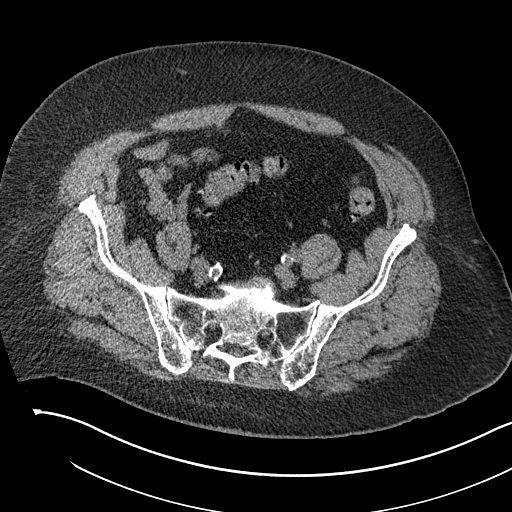
[im 100/124  soft-tissue]
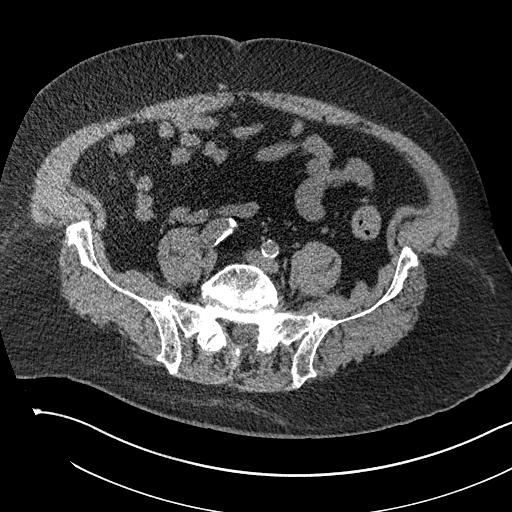
[im 108/124  soft-tissue]
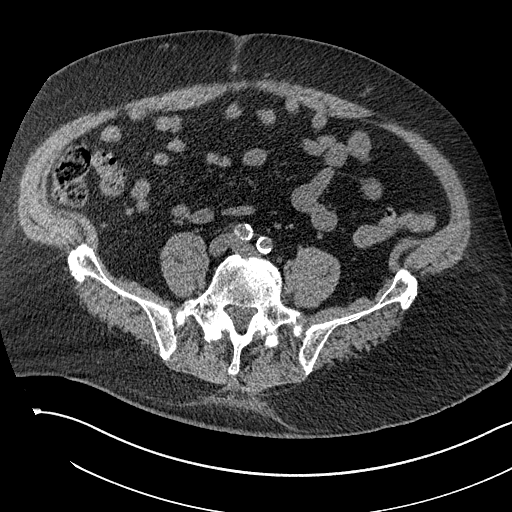
[im 116/124  soft-tissue]
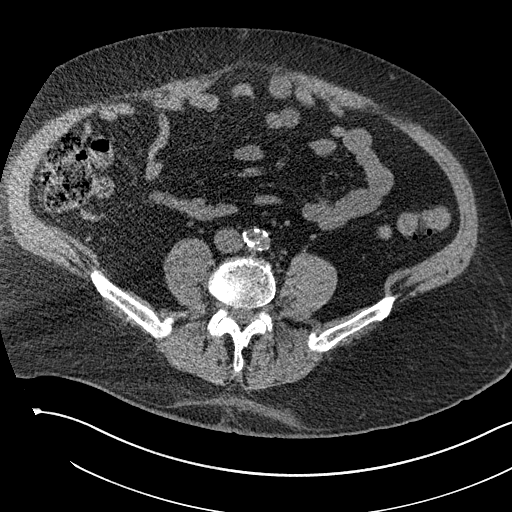

[Series 6: pelvis 2.0 cor. bone · coronal · 0.48mm/px · 3 of 191 slices shown]
[im 64/191  soft-tissue]
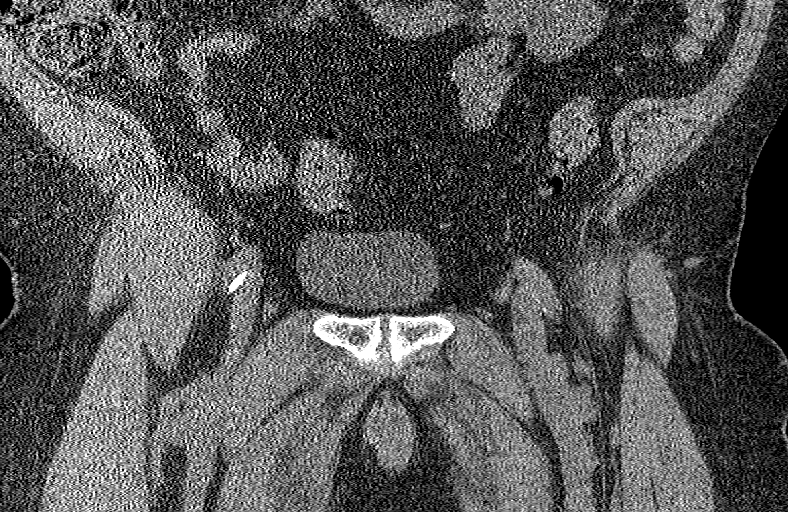
[im 85/191  soft-tissue]
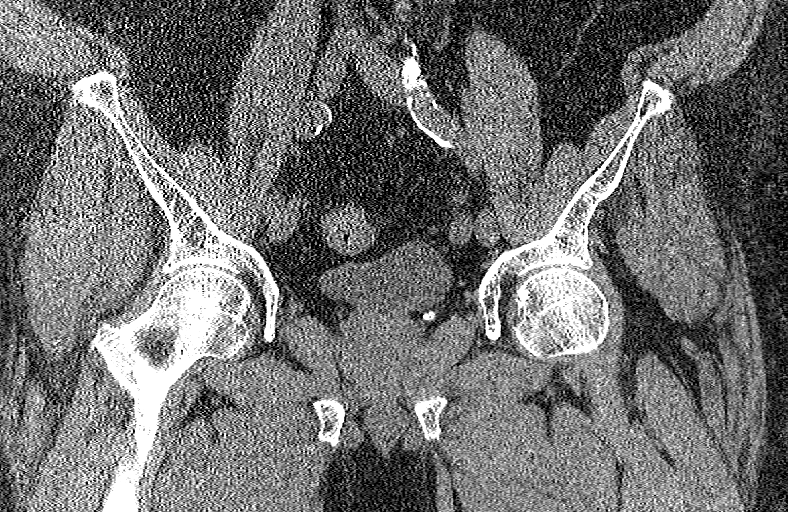
[im 106/191  soft-tissue]
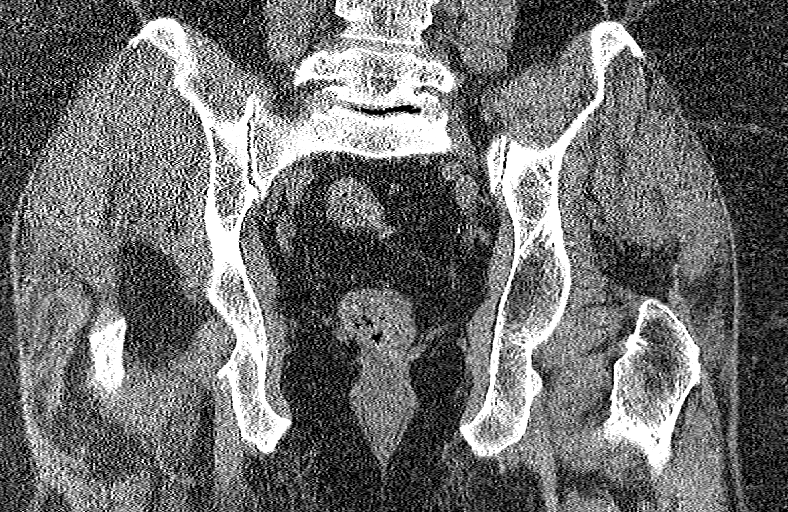

[16 of 46 positions shown; findings below may reference images not displayed]

FINDINGS: Urinary Tract:  No abnormality visualized.

Bowel: Sigmoid and descending colonic diverticulosis without acute
diverticulitis.

Vascular/Lymphatic: Moderate aortoiliac atherosclerosis without
aneurysm.

Reproductive:  No mass or other significant abnormality

Other:  No free air free fluid.

Musculoskeletal: Degenerative disc disease L4-5 and to a greater
extent L5-S1 with vacuum disc phenomenon. Grade 1 anterolisthesis of
L5 on S1. No pars defects. Intact sacrum and sacroiliac joints. No
acute pelvic fracture. Subcortical geodes of the right femoral
head-neck junction, stable in appearance. No pathologic fractures.
IMPRESSION: 1. No acute osseous abnormality of the bony pelvis.
2. Lower lumbar degenerative disc disease and spondylosis with grade
1 spondylolisthesis of L5 on S1.

## 2019-06-07 IMAGING — CT CT ABD-PELV W/O CM
2 of 4 series · 17 of 46 positions shown, 19 images · non-contrast
Comparison: 03/30/2016 and 12/30/2014

CLINICAL DATA: Fall today while walking. Multiple previous although
chronic back pain.

EXAM:
CT ABDOMEN AND PELVIS WITHOUT CONTRAST
TECHNIQUE: Multidetector CT imaging of the abdomen and pelvis was performed
following the standard protocol without IV contrast.

[Series 2: abd/pel w/o · axial · non-contrast · 0.98mm/px · z∈[+885,+1335]mm · 14 of 102 slices shown, 16 images]
[im 6/102  soft-tissue]
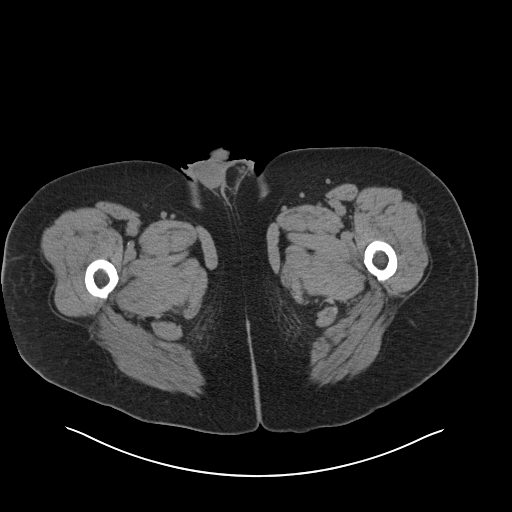
[im 6/102  bone]
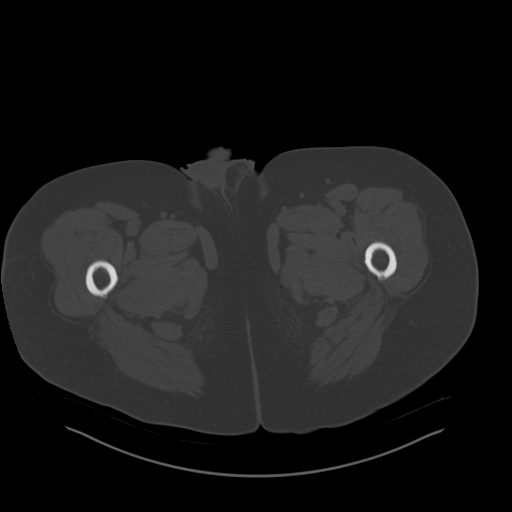
[im 11/102  soft-tissue]
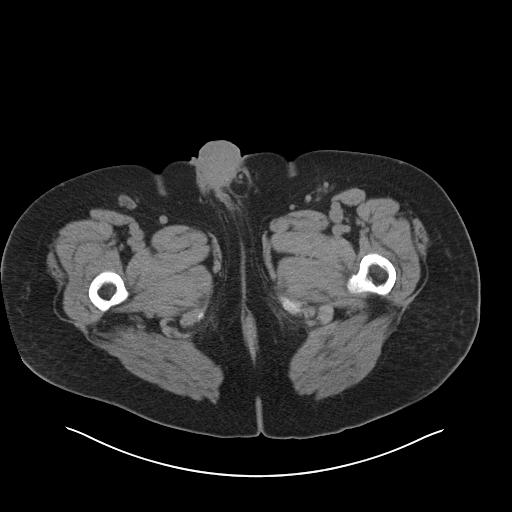
[im 22/102  soft-tissue]
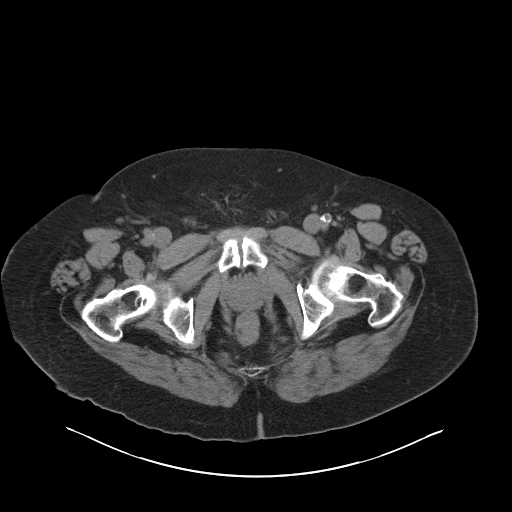
[im 27/102  soft-tissue]
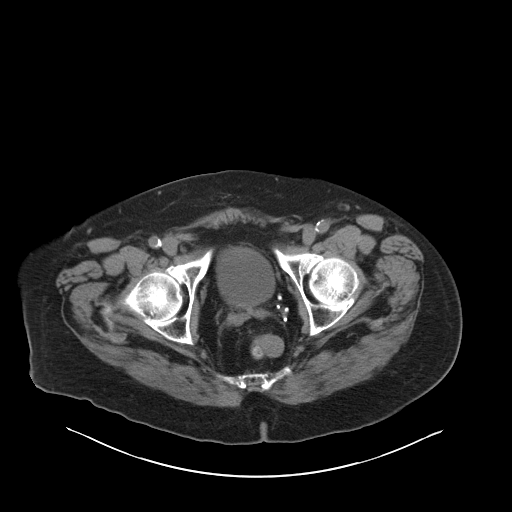
[im 32/102  soft-tissue]
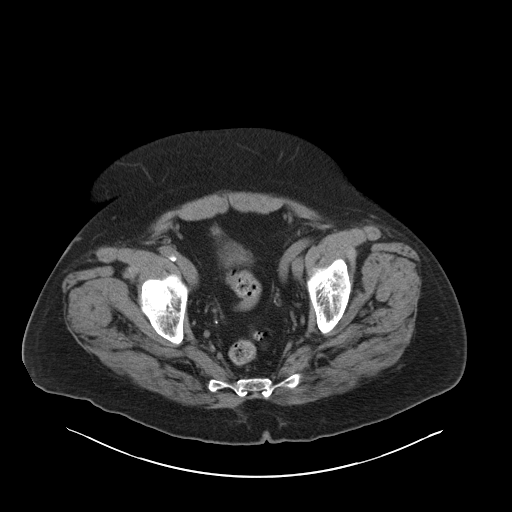
[im 43/102  soft-tissue]
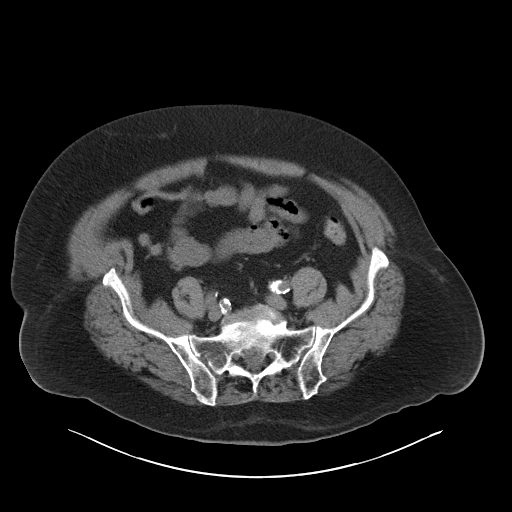
[im 48/102  soft-tissue]
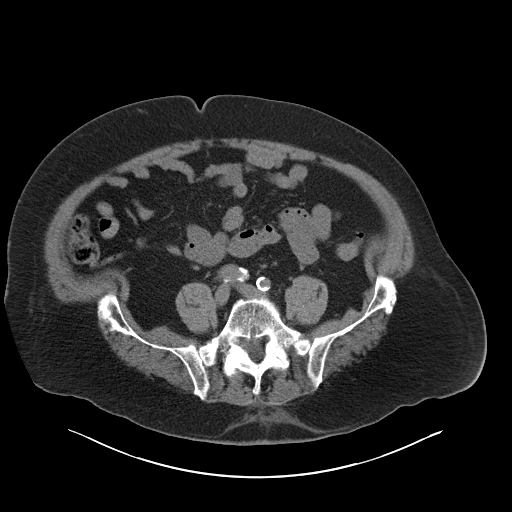
[im 54/102  soft-tissue]
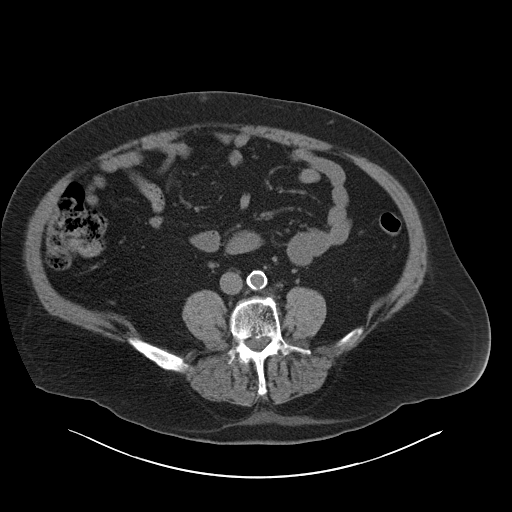
[im 59/102  soft-tissue]
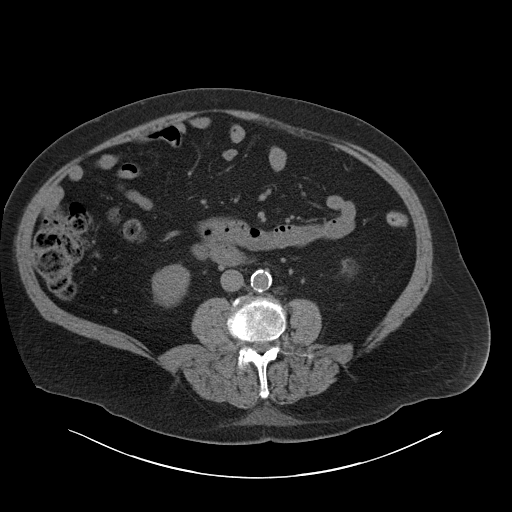
[im 59/102  bone]
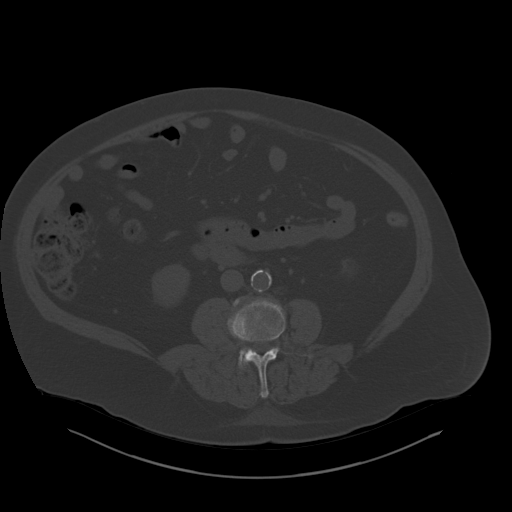
[im 70/102  soft-tissue]
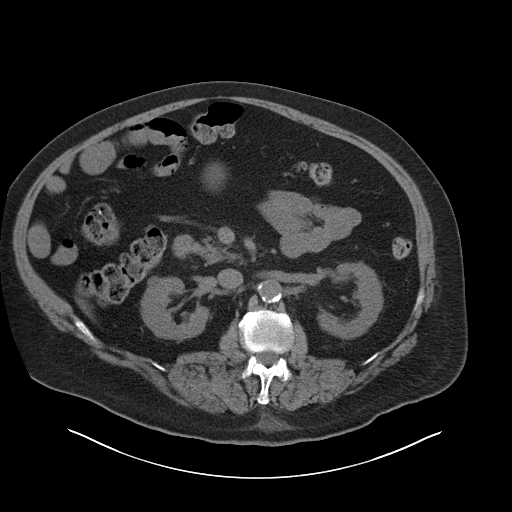
[im 75/102  soft-tissue]
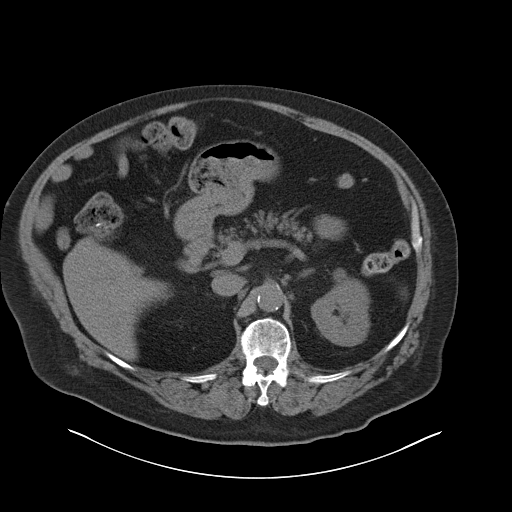
[im 80/102  soft-tissue]
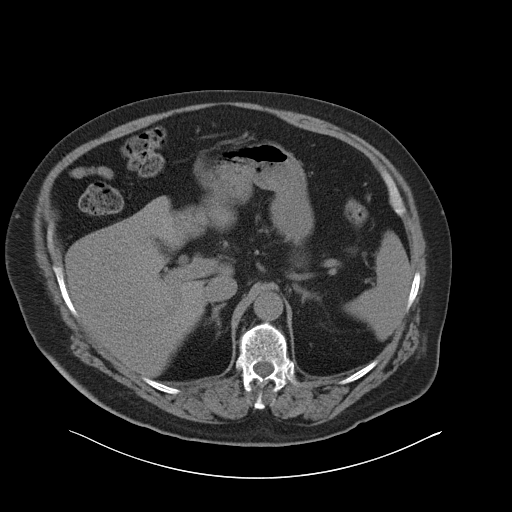
[im 91/102  soft-tissue]
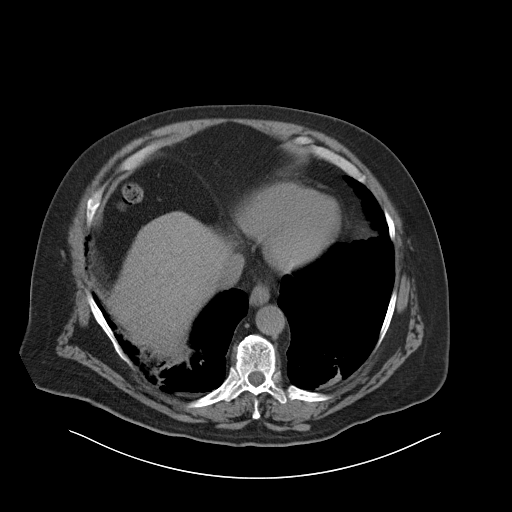
[im 96/102  soft-tissue]
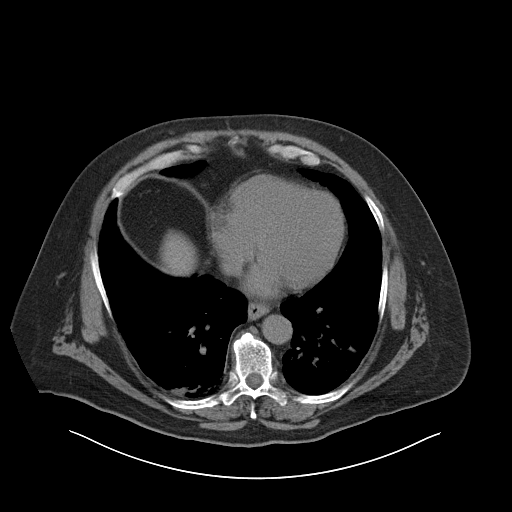

[Series 5: coronal · coronal · 1.01mm/px · 3 of 174 slices shown]
[im 58/174  soft-tissue]
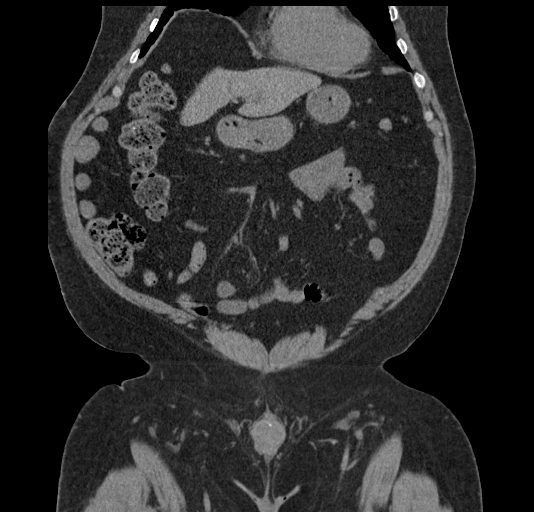
[im 77/174  soft-tissue]
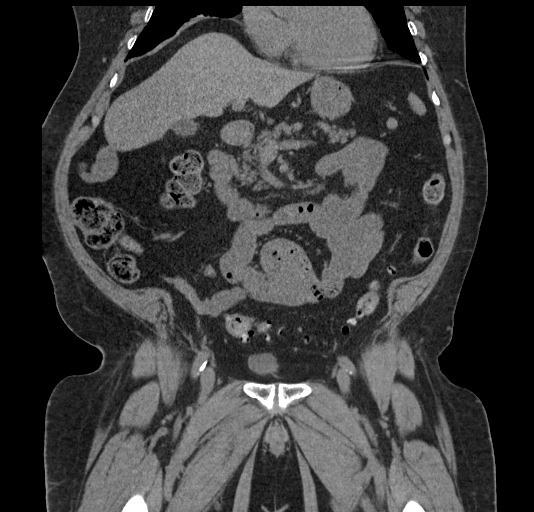
[im 97/174  soft-tissue]
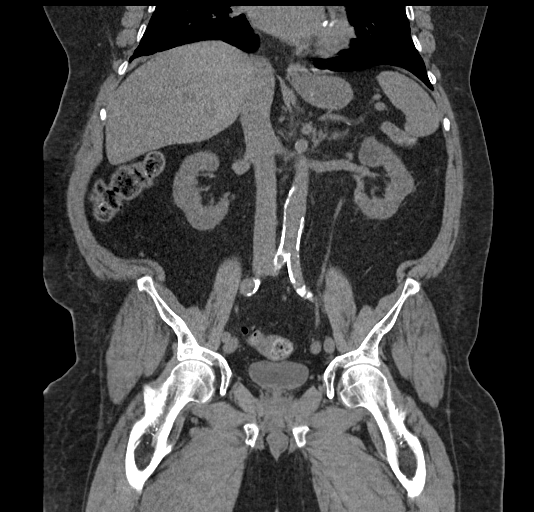

[17 of 46 positions shown; findings below may reference images not displayed]

FINDINGS: Lower chest: Mild bibasilar scarring and minimal bronchiectatic
change.

Hepatobiliary: Normal.

Pancreas: Normal.

Spleen: Normal.

Adrenals/Urinary Tract: Adrenal glands are normal. Kidneys are
normal in size without hydronephrosis. There is a 3 mm stone over
the mid pole left renal collecting system. 1.4 cm cyst over the mid
to upper left renal cortex. Subcentimeter hyperdensity over the mid
to upper pole right renal cortex too small to characterize but
likely a cyst. Minimal scarring upper pole right kidney. Ureters and
bladder are normal.

Stomach/Bowel: Stomach and small bowel are normal. Appendix is
normal. There is diverticulosis of the colon most prominent over the
sigmoid colon.

Vascular/Lymphatic: Moderate calcified plaque over the abdominal
aorta. No adenopathy.

Reproductive: Within normal.

Other: No free fluid or focal inflammatory change.

Musculoskeletal: There are degenerative changes of the spine with
stable grade 1 anterolisthesis of L5 on S1. Mild degenerate change
of the hips.
IMPRESSION: No acute findings in the abdomen/pelvis.

3 mm nonobstructing left renal stone.

Bilateral renal cysts.

Bibasilar scarring with bronchiectatic change.

Colonic diverticulosis.

Aortic Atherosclerosis ([id number]).

Stable grade 1 anterolisthesis of L5 on S1.

## 2019-06-14 IMAGING — CT CT HEAD W/O CM
5 of 8 series · 17 of 47 positions shown, 18 images · non-contrast
Comparison: Head CT 11/20/2016

CLINICAL DATA: Fall while walking with walker striking head on
wall. Laceration of right eyebrow. No loss of consciousness.

EXAM:
CT HEAD WITHOUT CONTRAST
CT CERVICAL SPINE WITHOUT CONTRAST
TECHNIQUE: Multidetector CT imaging of the head and cervical spine was
performed following the standard protocol without intravenous
contrast. Multiplanar CT image reconstructions of the cervical spine
were also generated.

[Series 3: head wo · axial · 0.44mm/px · z∈[-202,-38]mm · 3 of 34 slices shown, 4 images]
[im 1/34  brain]
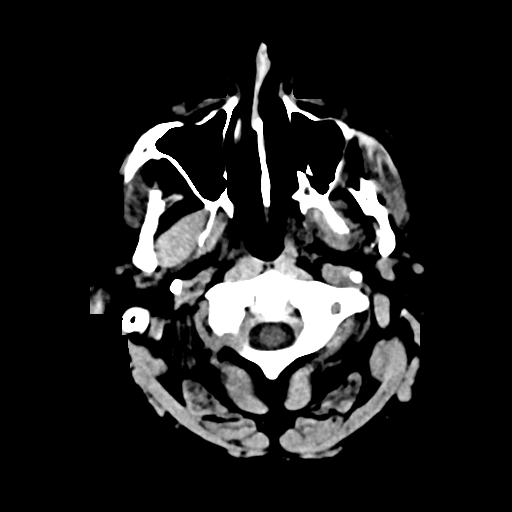
[im 1/34  bone]
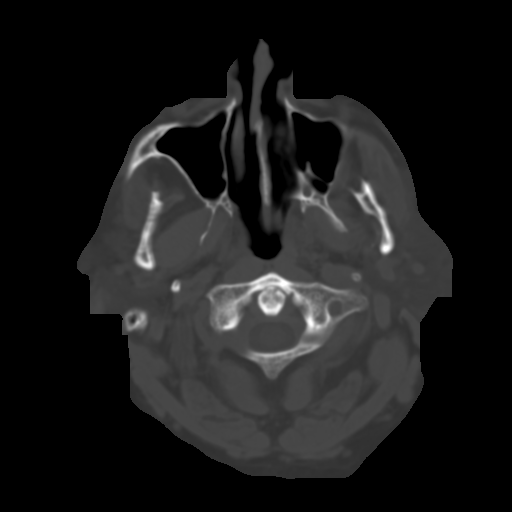
[im 17/34  brain]
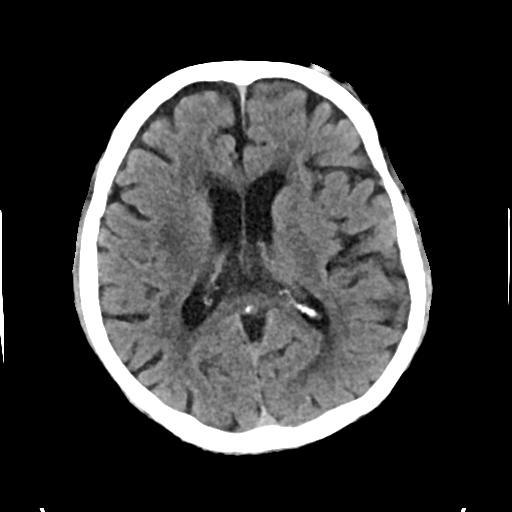
[im 34/34  brain]
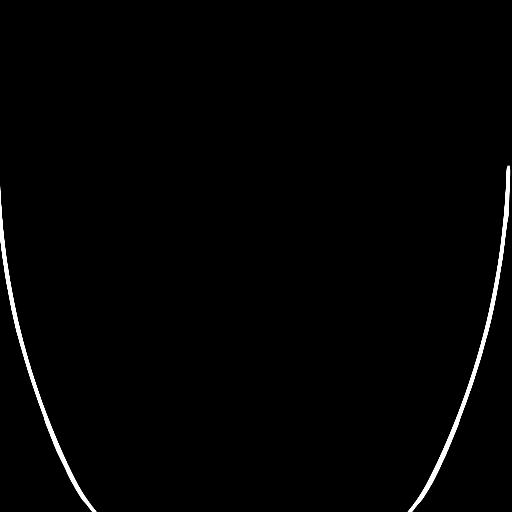

[Series 4: head bone · axial · 0.44mm/px · z∈[-182,-162]mm · 2 of 84 slices shown]
[im 11/84  bone]
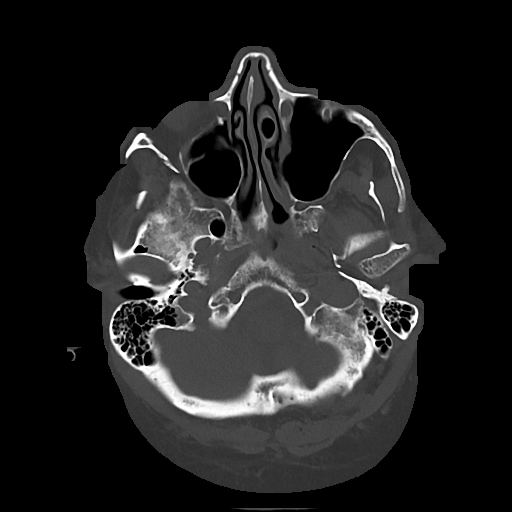
[im 21/84  bone]
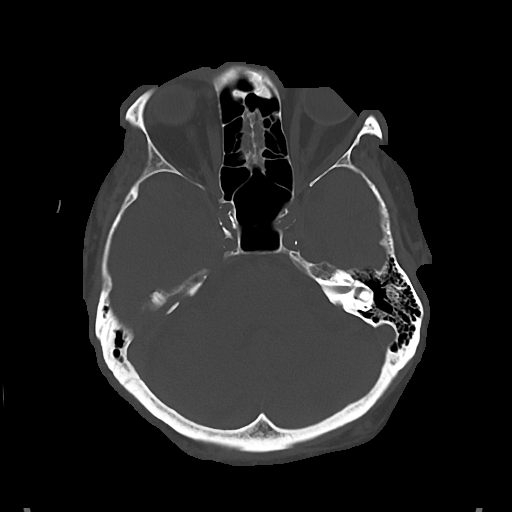

[Series 5: cor soft · coronal · 0.32mm/px · 3 of 71 slices shown]
[im 21/71  brain]
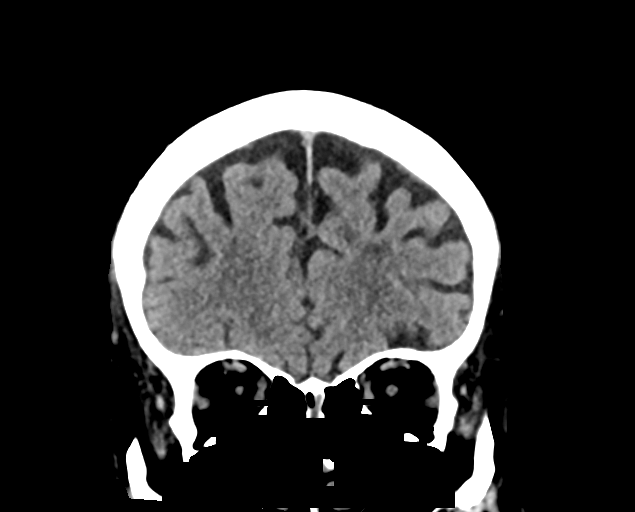
[im 31/71  brain]
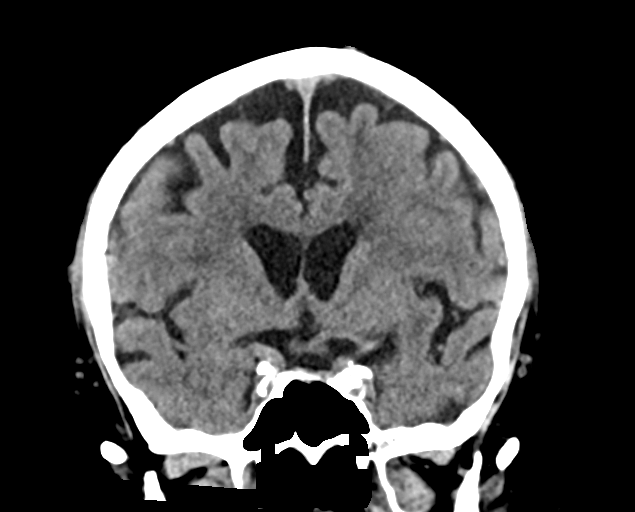
[im 41/71  brain]
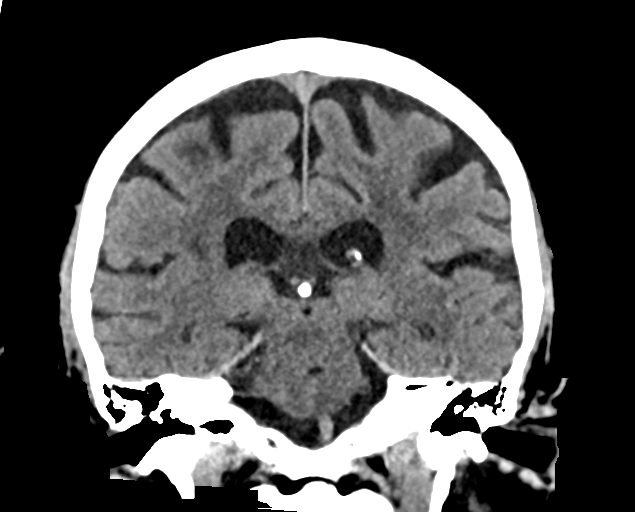

[Series 6: sag soft · sagittal · 0.32mm/px · 2 of 66 slices shown]
[im 22/66  brain]
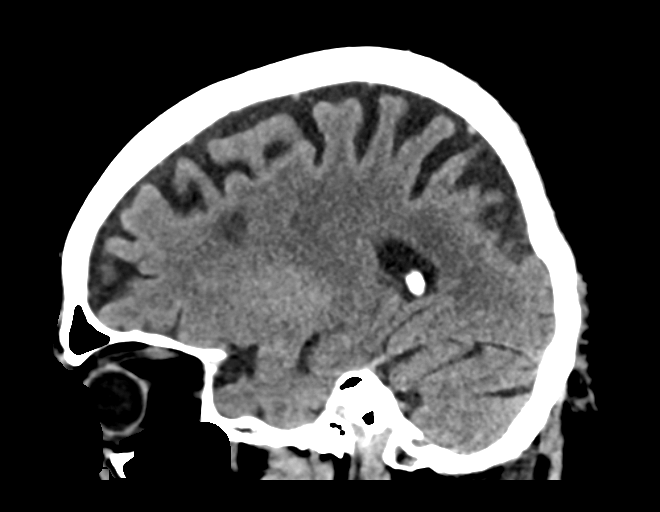
[im 44/66  brain]
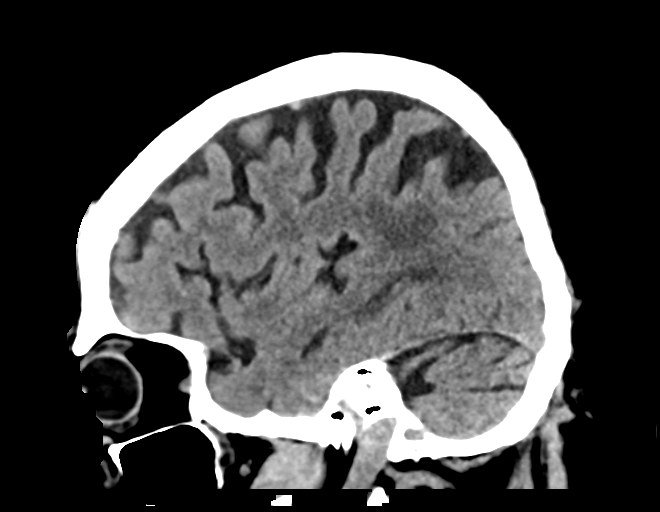

[Series 12: orthogonal axials · axial · 0.21mm/px · z∈[-327,-225]mm · 7 of 84 slices shown]
[im 11/84  brain]
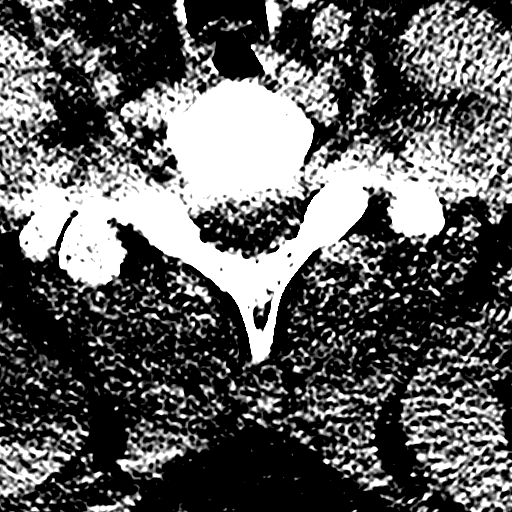
[im 21/84  brain]
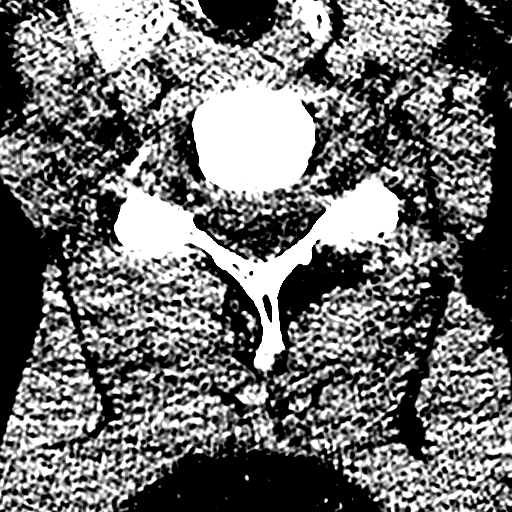
[im 32/84  brain]
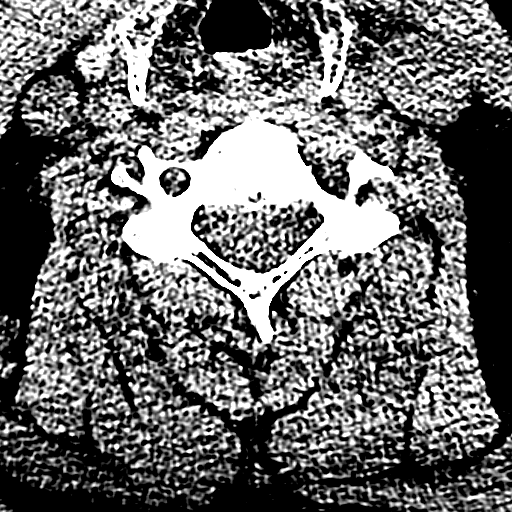
[im 42/84  brain]
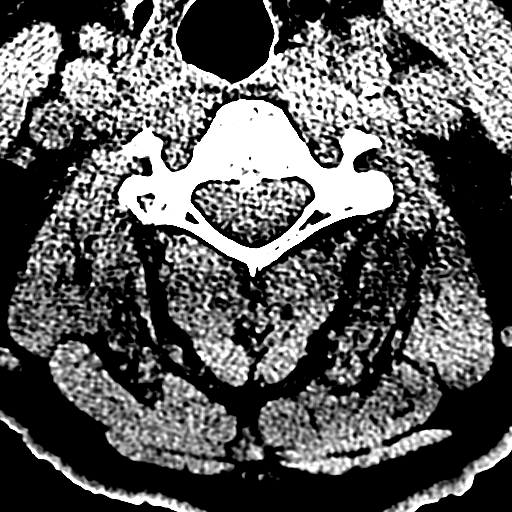
[im 52/84  brain]
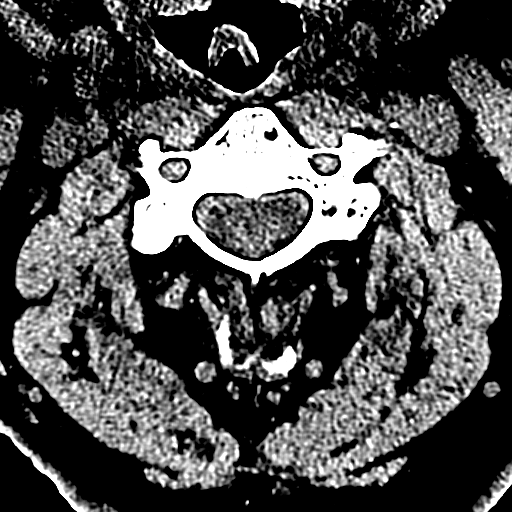
[im 63/84  brain]
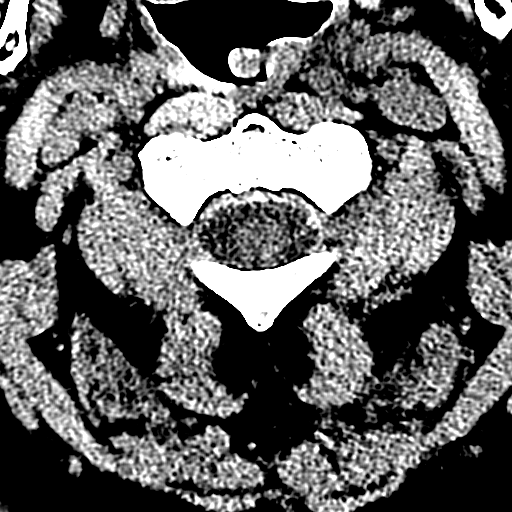
[im 73/84  brain]
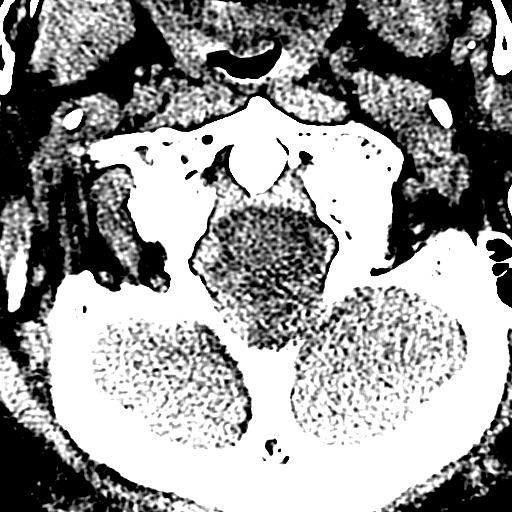

[17 of 47 positions shown; findings below may reference images not displayed]

FINDINGS: CT HEAD FINDINGS

Brain: Stable generalized atrophy. Stable periventricular chronic
small vessel ischemia. Remote lacunar infarct in the pons. No
intracranial hemorrhage, mass effect, or midline shift. No
hydrocephalus. The basilar cisterns are patent. No evidence of
territorial infarct or acute ischemia. No extra-axial or
intracranial fluid collection.

Vascular: Atherosclerosis of skullbase vasculature without
hyperdense vessel or abnormal calcification.

Skull: No fracture or focal lesion.

Sinuses/Orbits: Mild mucosal thickening of ethmoid air cells. No
sinus fluid levels. Mastoid air cells are clear. Prior left cataract
resection.

Other: None.

CT CERVICAL SPINE FINDINGS

Alignment: Normal.

Skull base and vertebrae: No acute fracture. Vertebral body heights
are maintained. The dens and skull base are intact.

Soft tissues and spinal canal: No prevertebral fluid or swelling. No
visible canal hematoma.

Disc levels: Mild disc space narrowing and endplate spurring at
C4-C5 and C5-C6.

Upper chest: No acute finding.

Other: Carotid calcifications.
IMPRESSION: 1.  No acute intracranial abnormality.  No skull fracture.
2. Cerebral atrophy and chronic small vessel ischemia, stable from
prior exam.
3. No fracture or subluxation of the cervical spine.

## 2019-06-14 IMAGING — CR DG CHEST 2V
2 series · 2 of 2 positions shown · non-contrast
Comparison: Radiograph 11/20/2016

CLINICAL DATA: Fall walking with walker.

EXAM:
CHEST  2 VIEW

[chest lat]
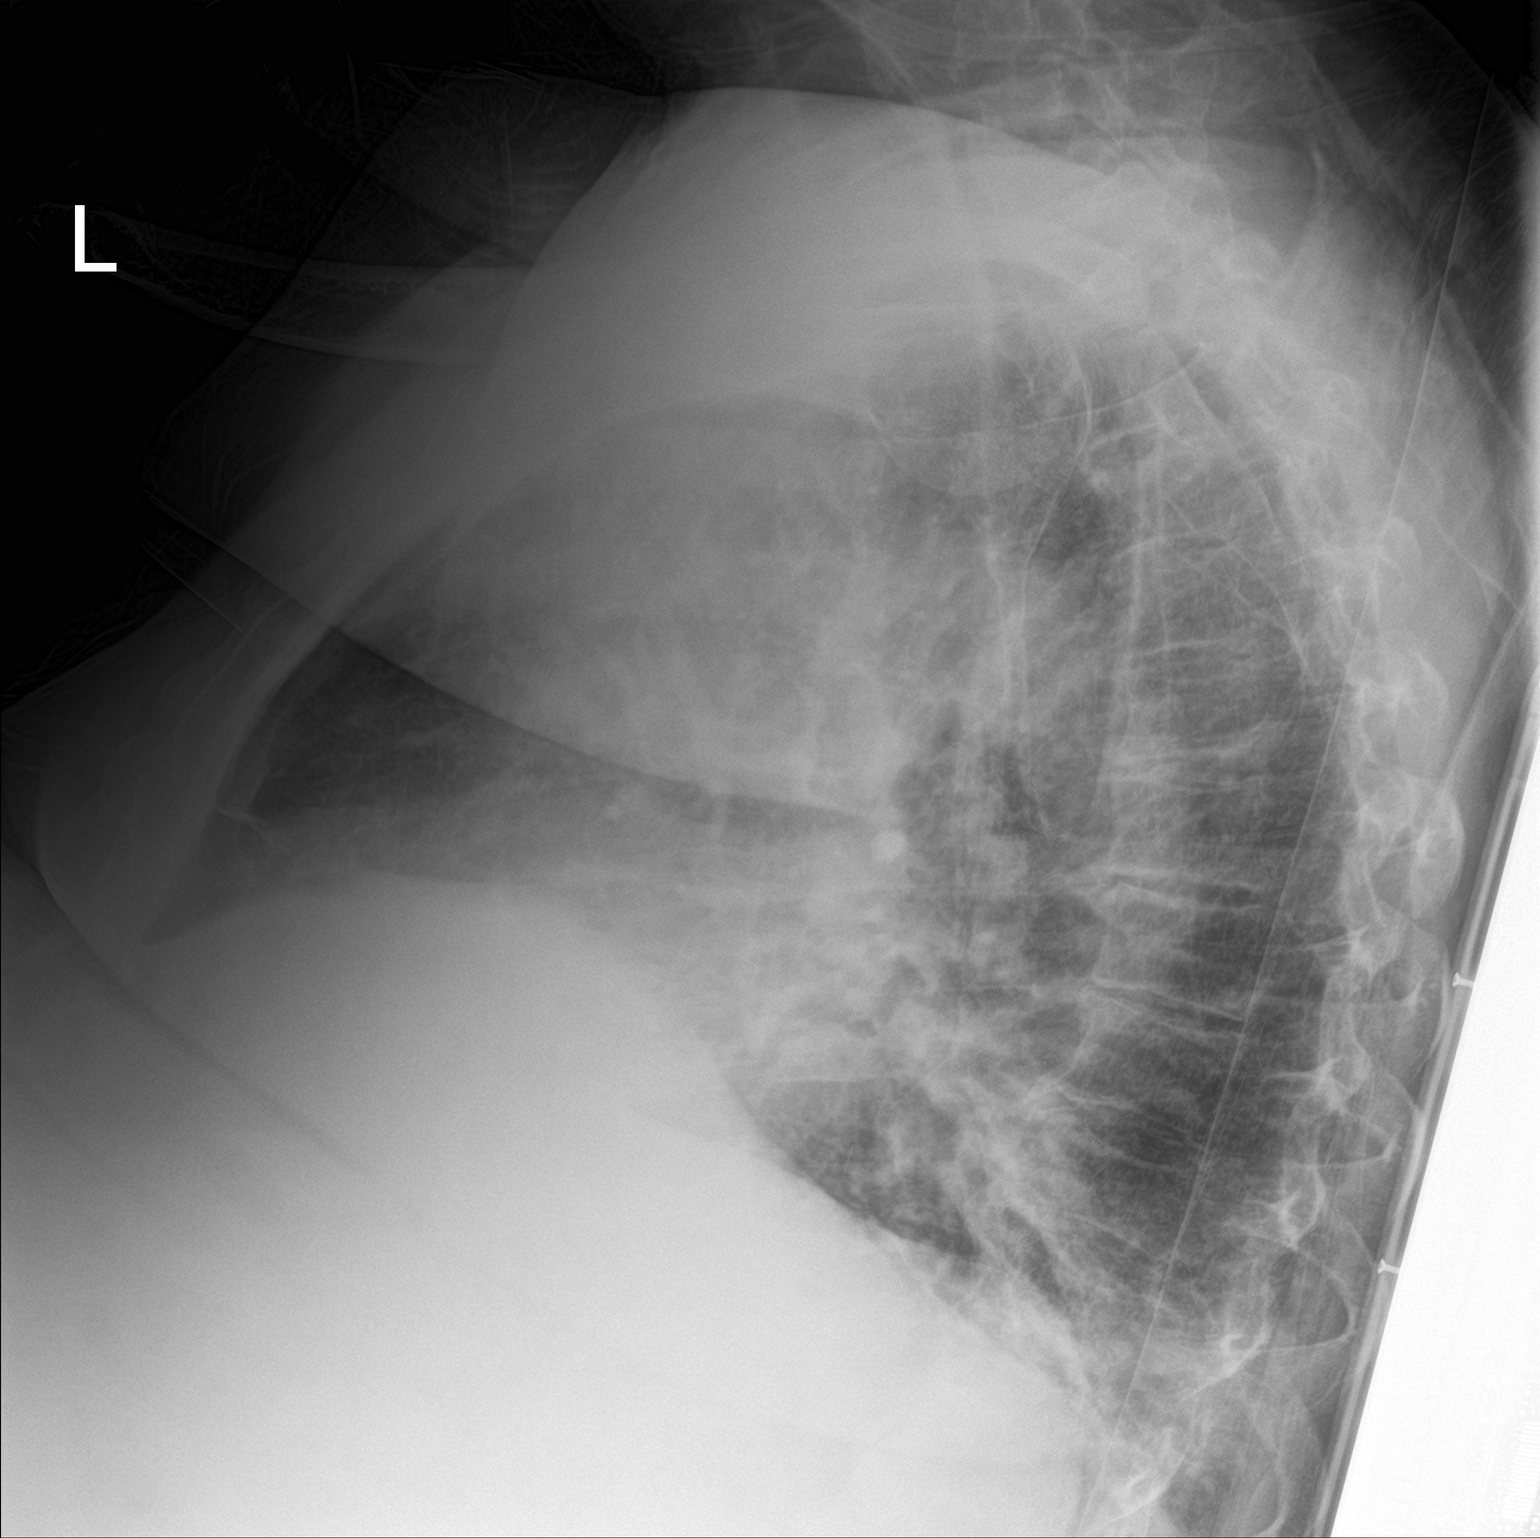

[chest ap]
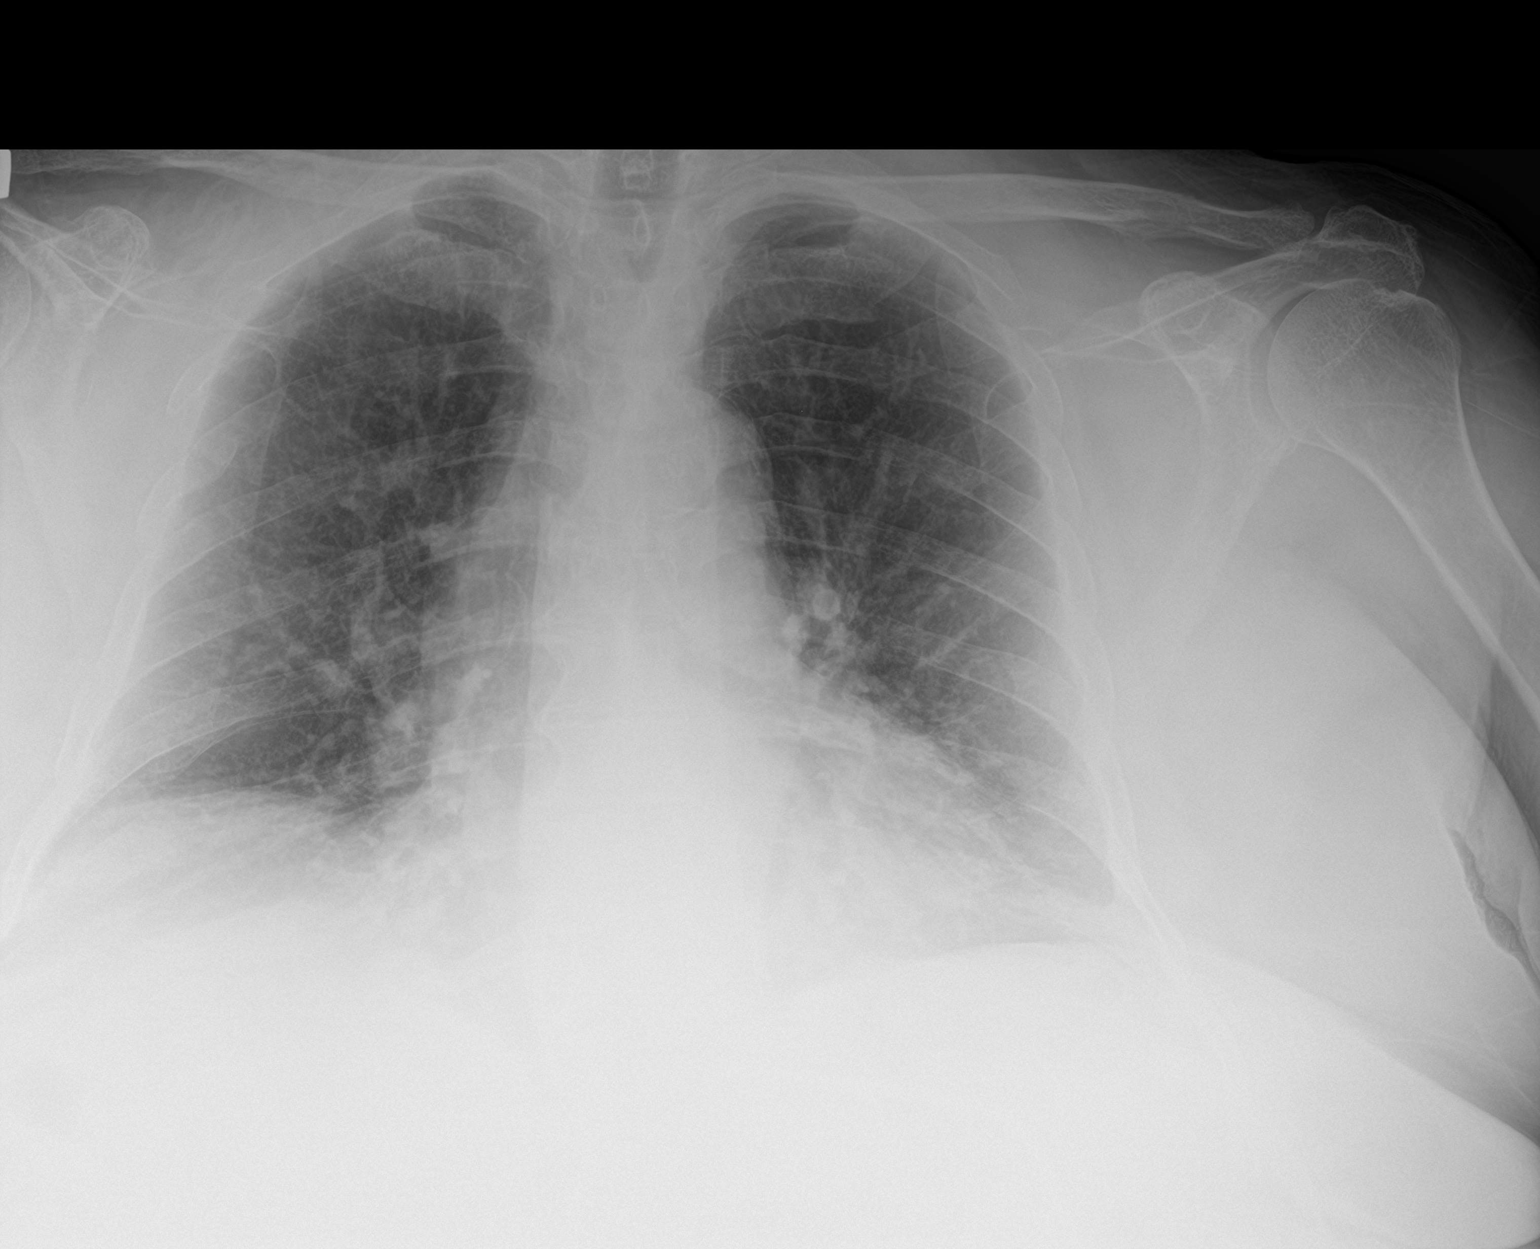

[2 of 2 positions shown; findings below may reference images not displayed]

FINDINGS: Unchanged heart size and mediastinal contours. Low lung volumes with
crowding of bronchovascular structures. Bibasilar atelectasis. No
confluent consolidation, pneumothorax or pleural effusion. Remote
right rib fracture.
IMPRESSION: Low lung volumes with bibasilar atelectasis.

## 2019-06-24 IMAGING — CR DG HAND COMPLETE 3+V*R*
3 series · 3 of 3 positions shown · non-contrast
Comparison: 11/16/2015

CLINICAL DATA: Fall, wrist pain.  Thumb pain.

EXAM:
RIGHT HAND - COMPLETE 3+ VIEW

[x hand pa right]
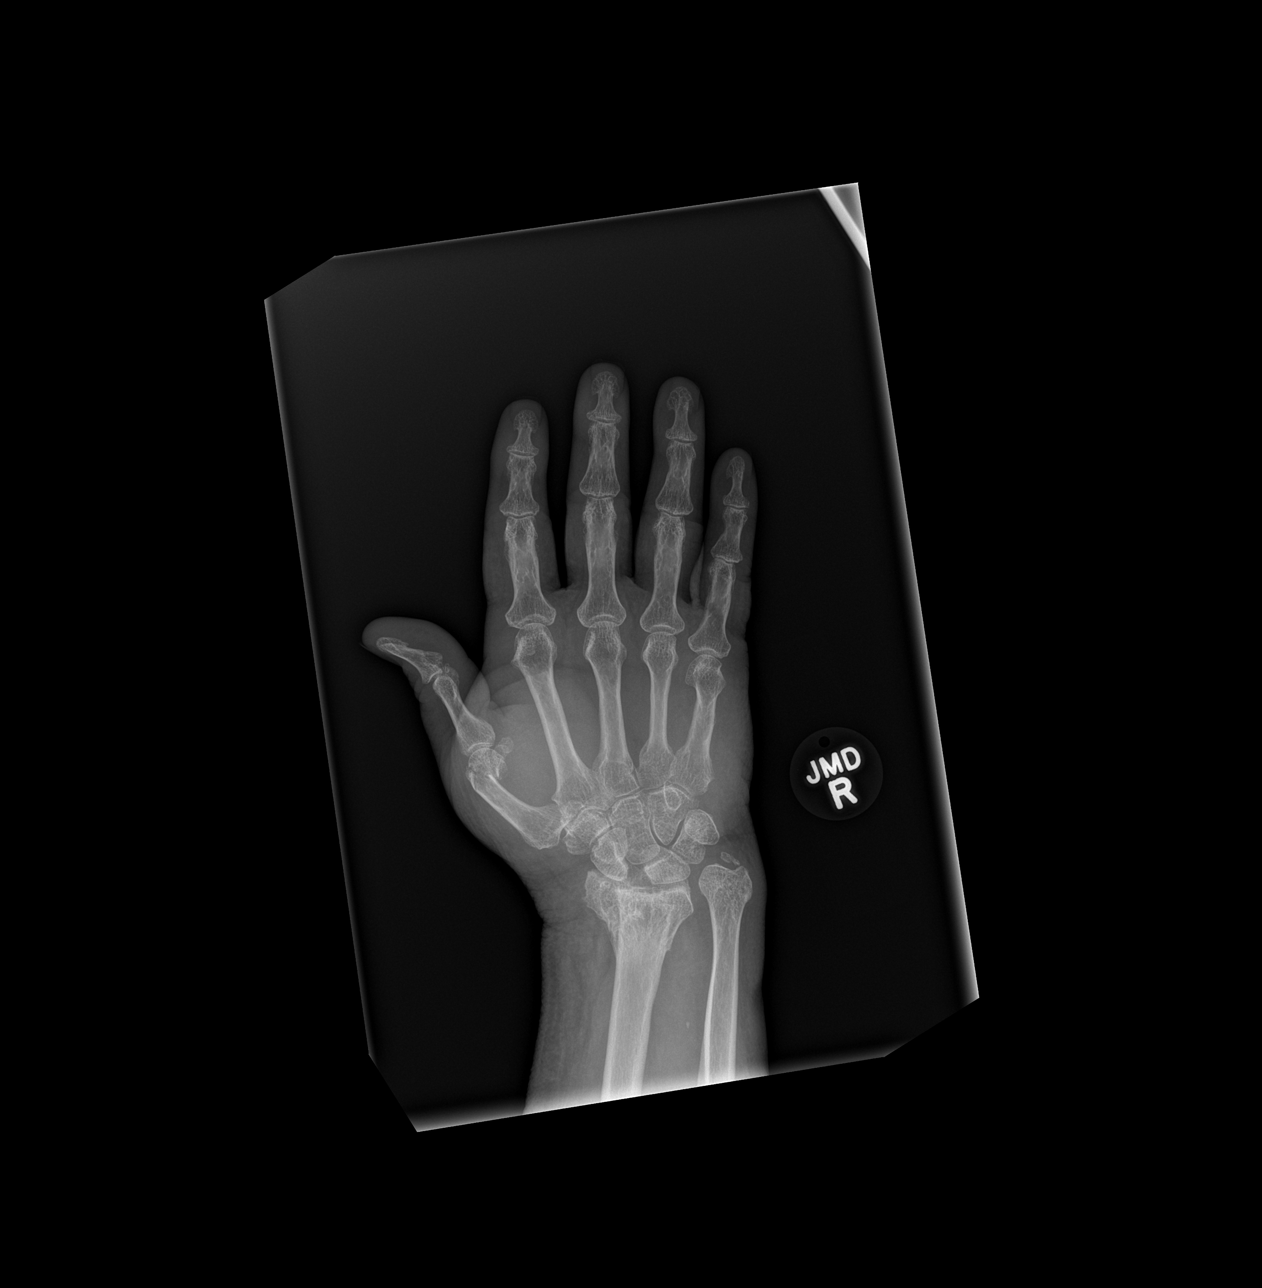

[x hand obl right]
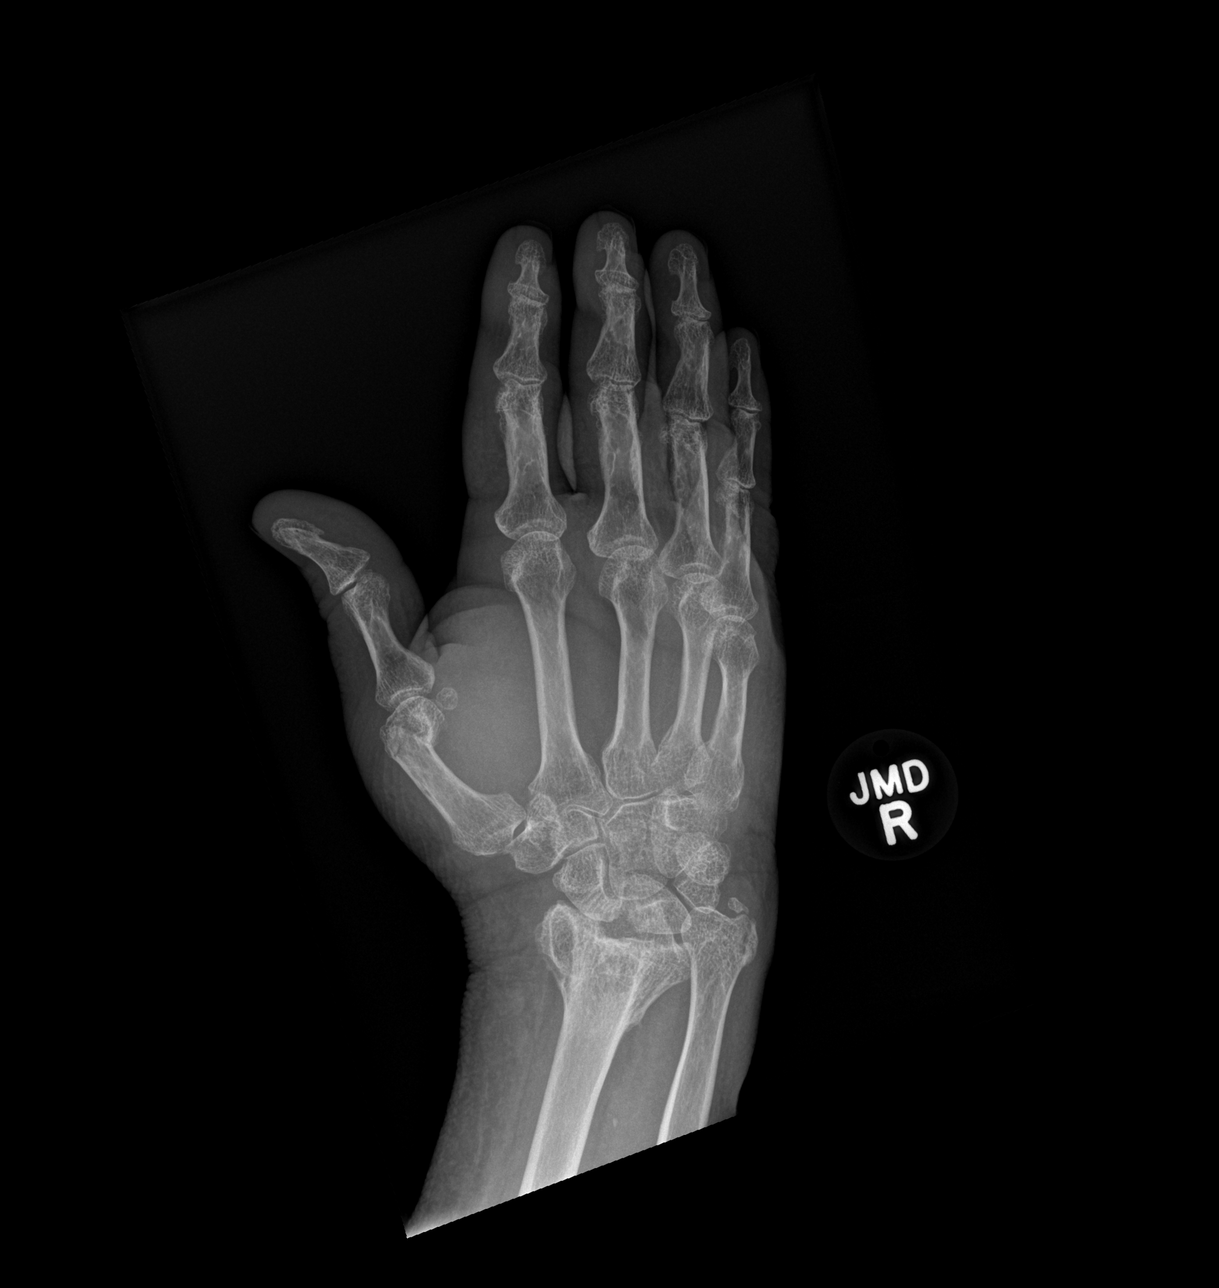

[x hand lat right]
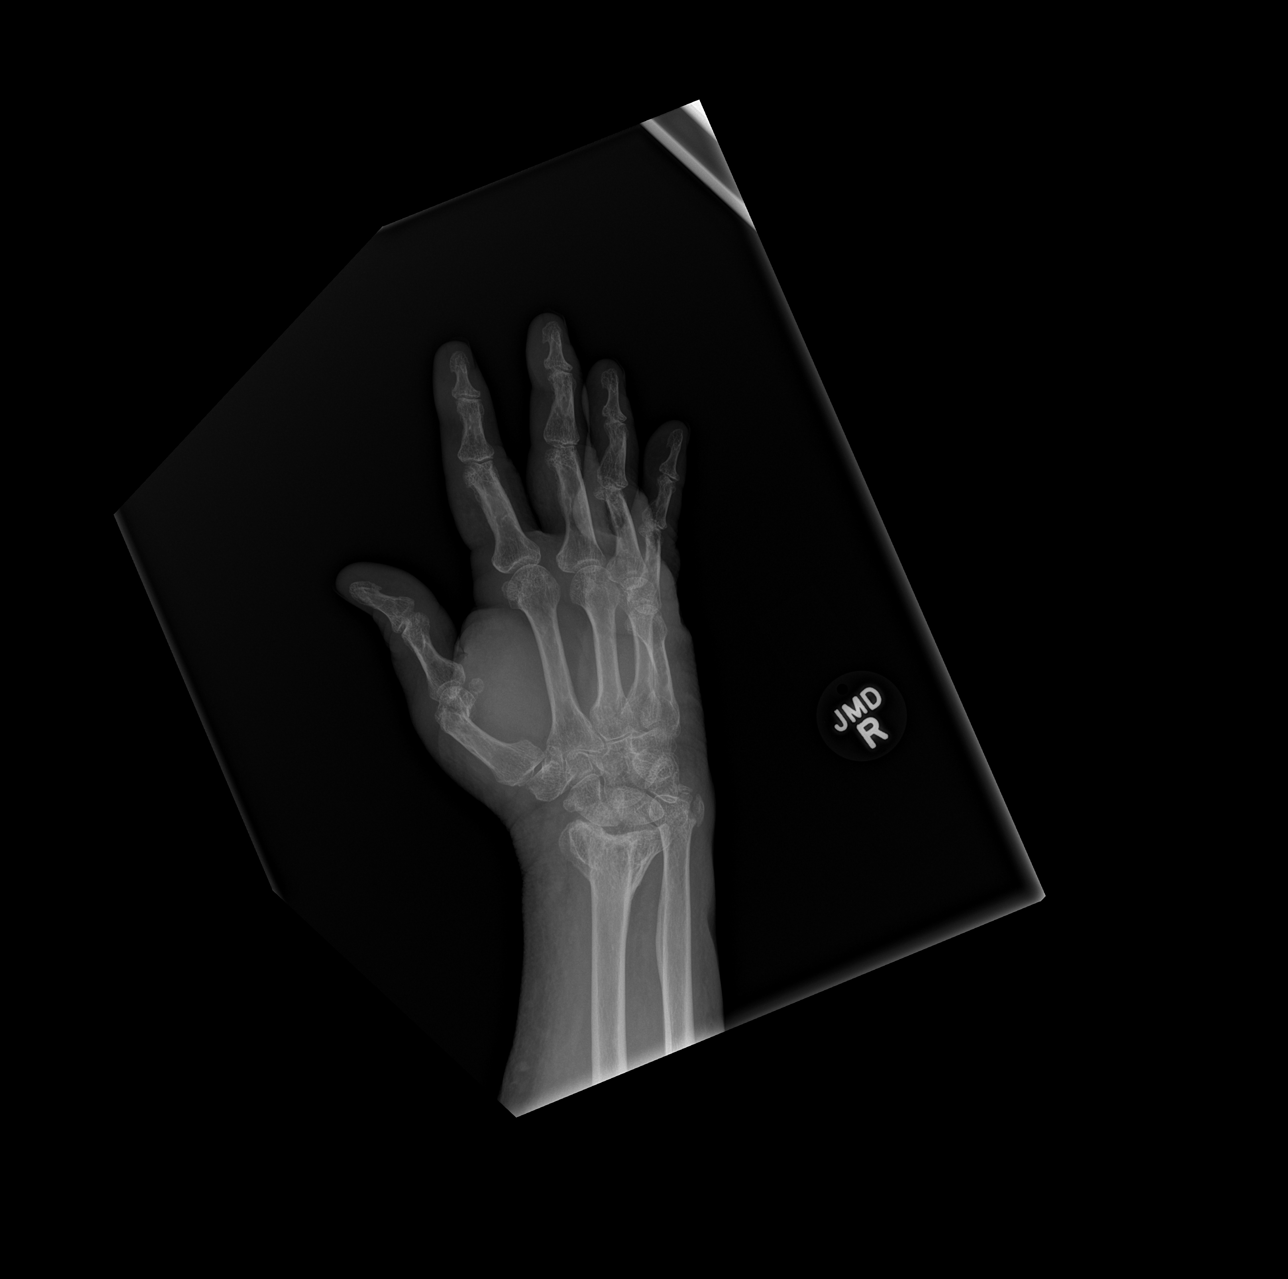

[3 of 3 positions shown; findings below may reference images not displayed]

FINDINGS: There is a fracture through the right first distal metacarpal with
mild angulation. Old healed distal radial fracture noted. There is
widening of the scapholunate distance, increasing since prior study,
now on 7 mm compared to 5 mm previously compatible with scapholunate
dissociation. Old ulnar styloid fracture noted.
IMPRESSION: Angulated distal right first metacarpal fracture.

Scapholunate dissociation, worsening since prior study.

Old distal radial and ulnar styloid fractures.

## 2019-06-24 IMAGING — CR DG WRIST COMPLETE 3+V*R*
3 series · 3 of 3 positions shown · non-contrast
Comparison: 11/20/2016

CLINICAL DATA: Fall, wrist and thumb pain.

EXAM:
RIGHT WRIST - COMPLETE 3+ VIEW

[x wrist pa right]
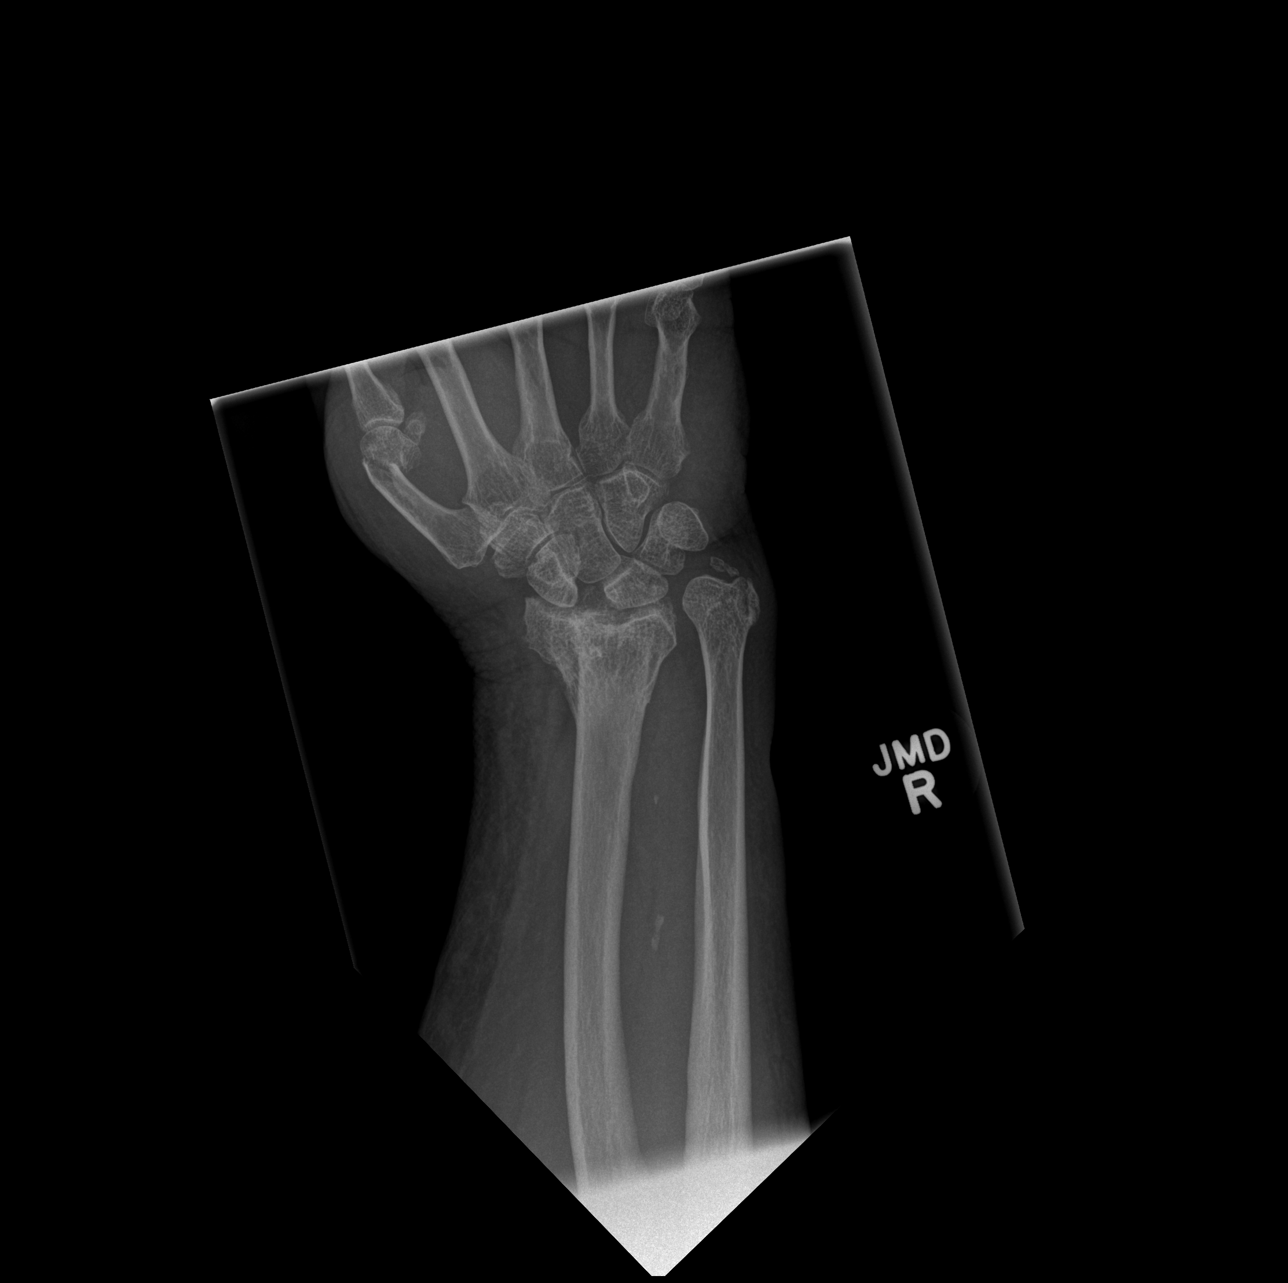

[x wrist obl right]
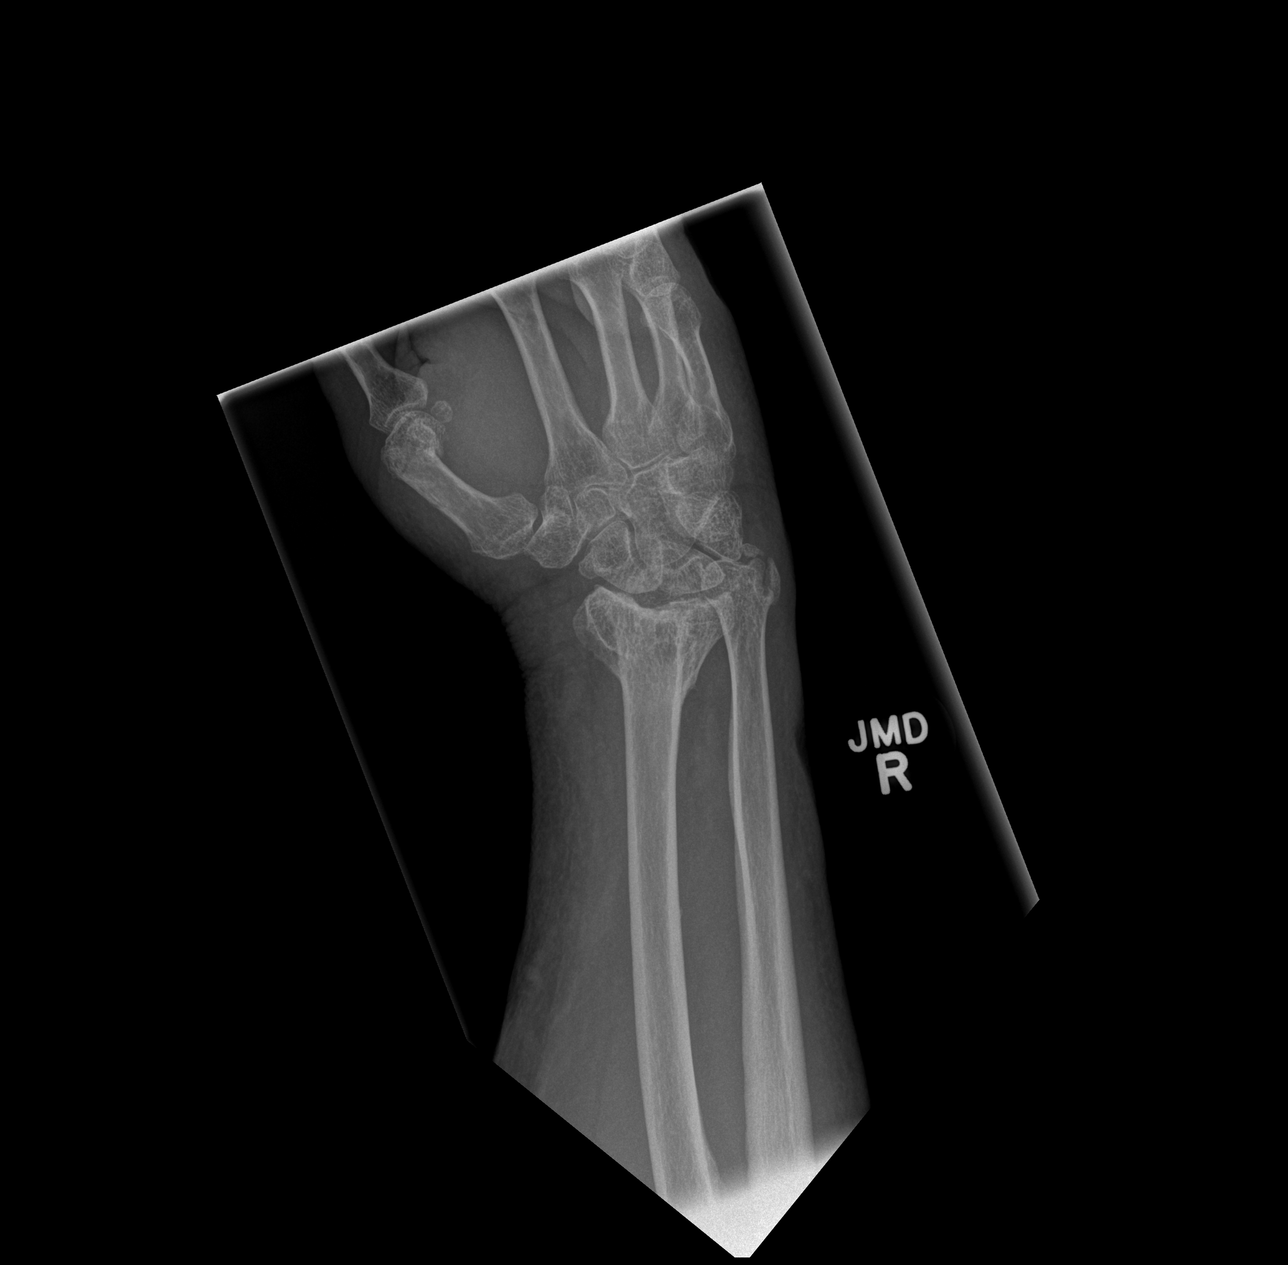

[x wrist lat right]
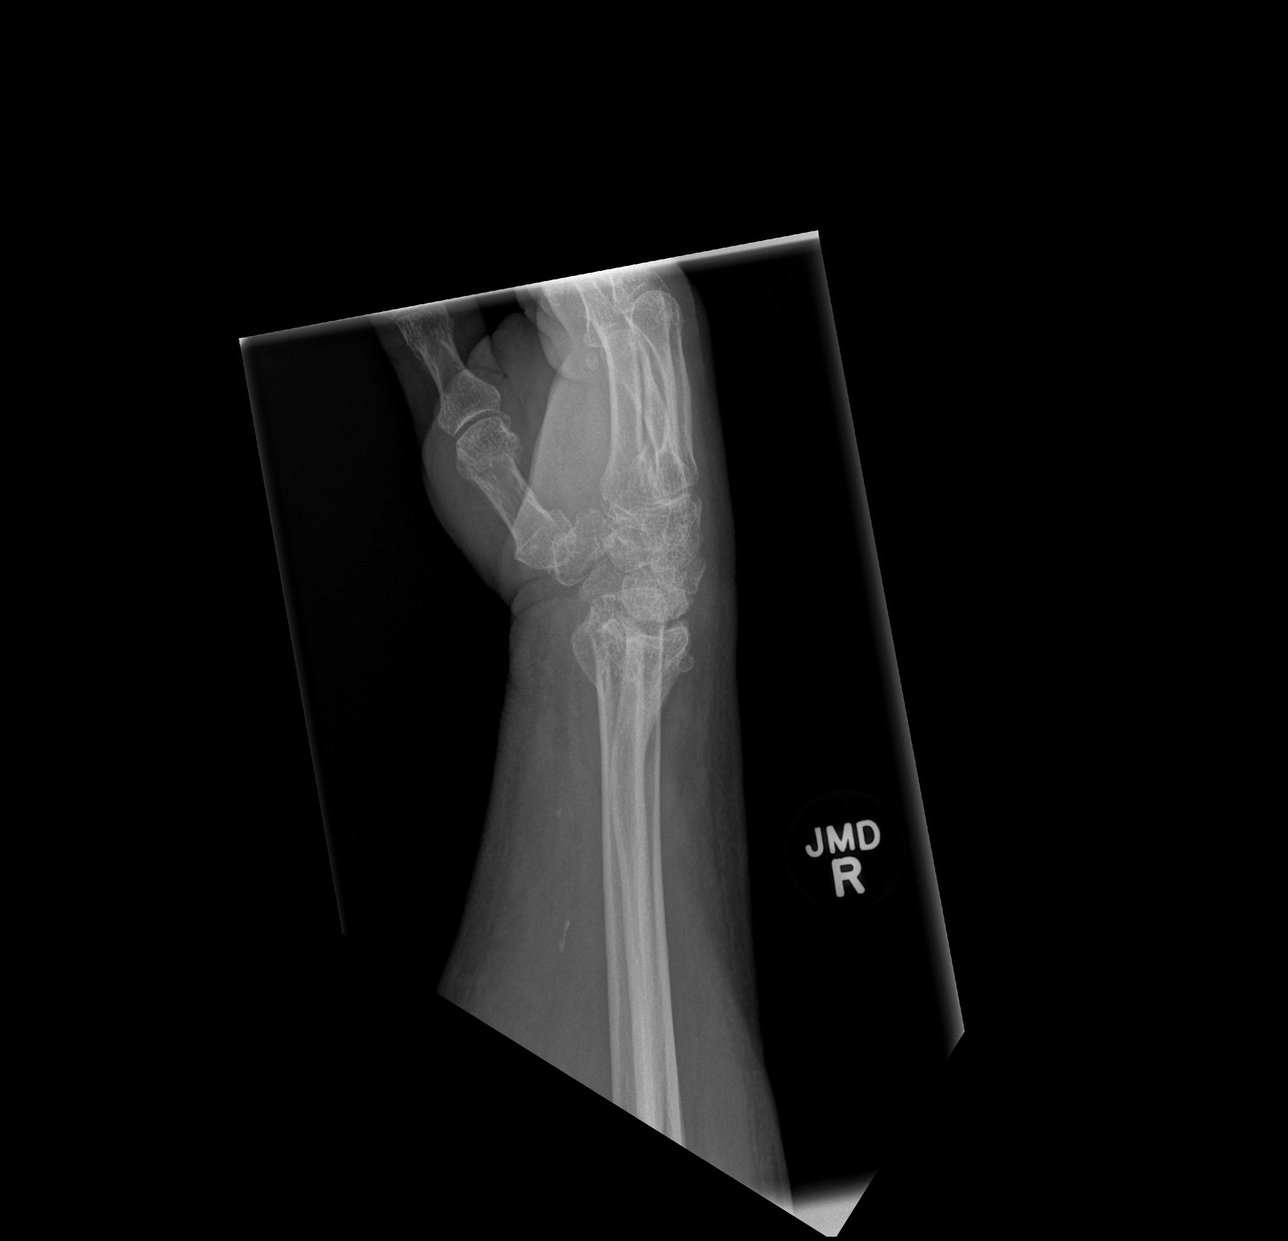

[3 of 3 positions shown; findings below may reference images not displayed]

FINDINGS: There is a fracture through the right first distal metacarpal with
mild angulation. Old healed distal radial fracture noted. There is
widening of the scapholunate distance, increasing since prior study,
now on 7 mm compared to 5 mm previously compatible with scapholunate
dissociation. Old ulnar styloid fracture noted.
IMPRESSION: Angulated distal right first metacarpal fracture.

Scapholunate dissociation, worsening since prior study.

Old distal radial and ulnar styloid fractures.

## 2019-07-11 IMAGING — CT CT HEAD W/O CM
4 series · 17 of 47 positions shown, 19 images · non-contrast
Comparison: 12/17/2016

CLINICAL DATA: Altered mental status, unwitnessed fall

EXAM:
CT HEAD WITHOUT CONTRAST
TECHNIQUE: Contiguous axial images were obtained from the base of the skull
through the vertex without intravenous contrast.

[Series 3: head wo · axial · 0.42mm/px · z∈[-102,+28]mm · 7 of 36 slices shown, 9 images]
[im 5/36  brain]
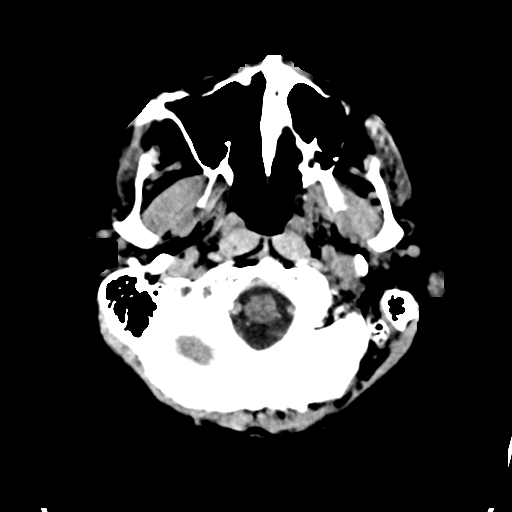
[im 5/36  bone]
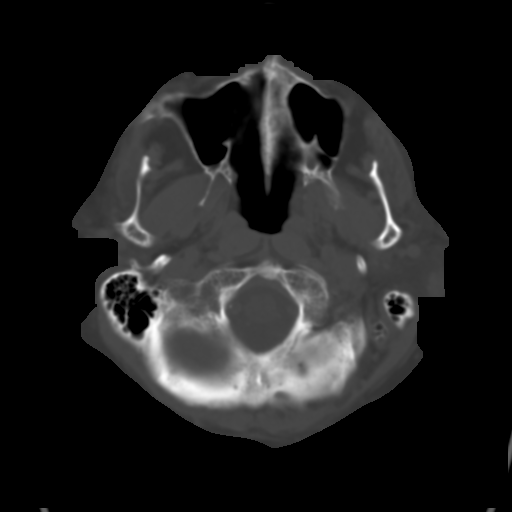
[im 9/36  brain]
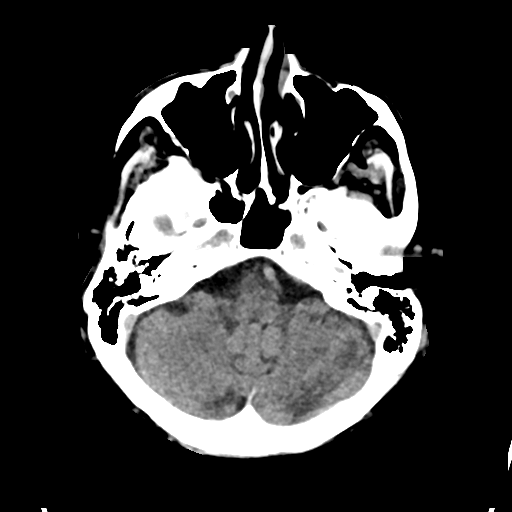
[im 14/36  brain]
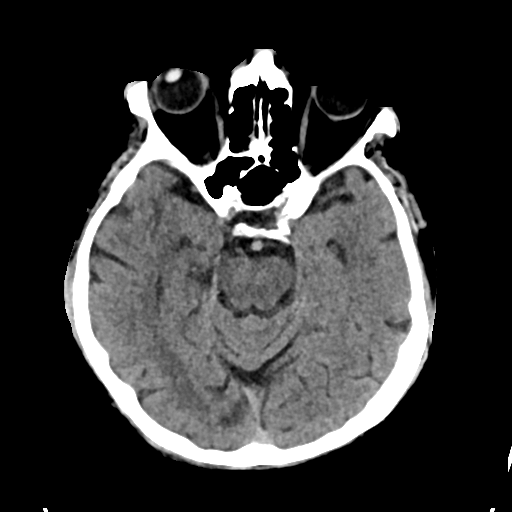
[im 18/36  brain]
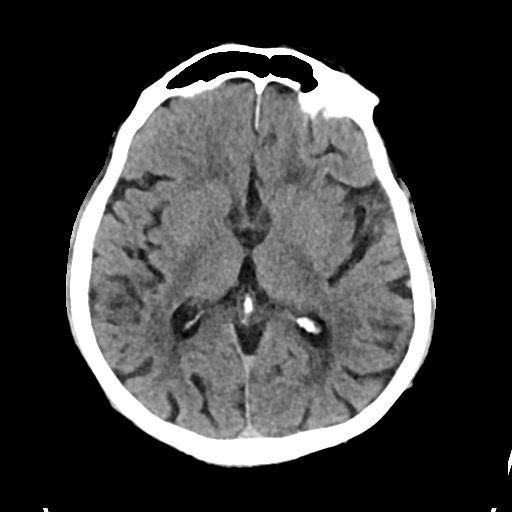
[im 22/36  brain]
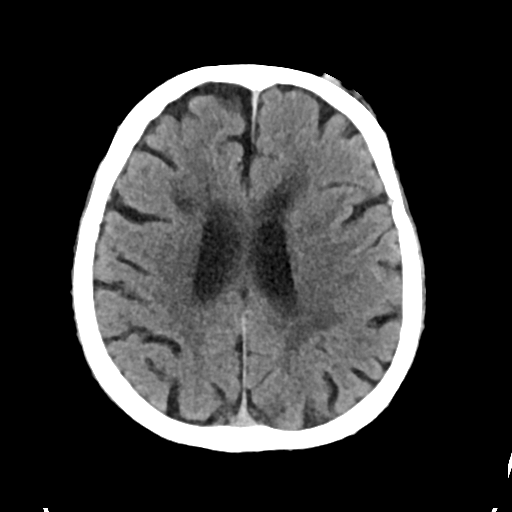
[im 22/36  bone]
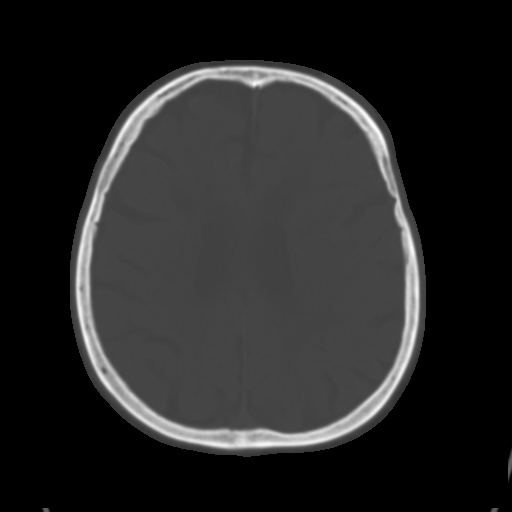
[im 27/36  brain]
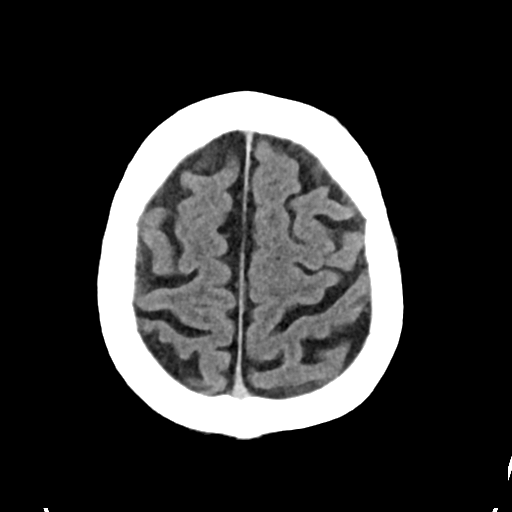
[im 31/36  brain]
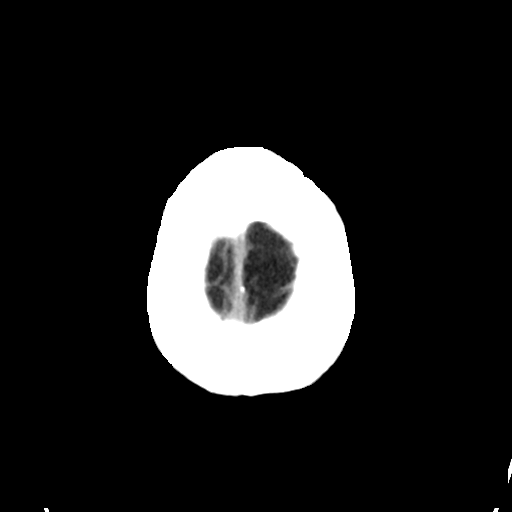

[Series 4: head bone · axial · 0.42mm/px · z∈[-106,-44]mm · 4 of 89 slices shown]
[im 9/89  bone]
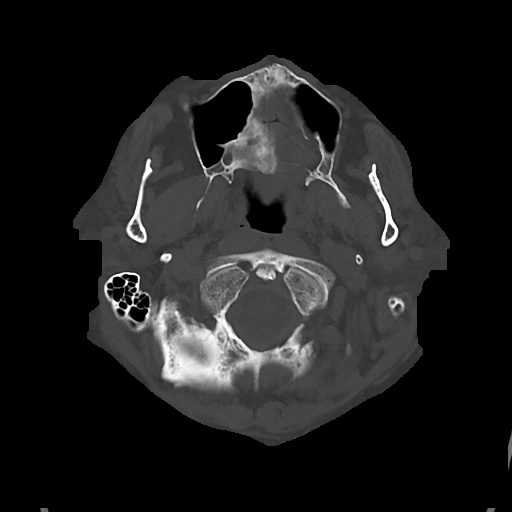
[im 18/89  bone]
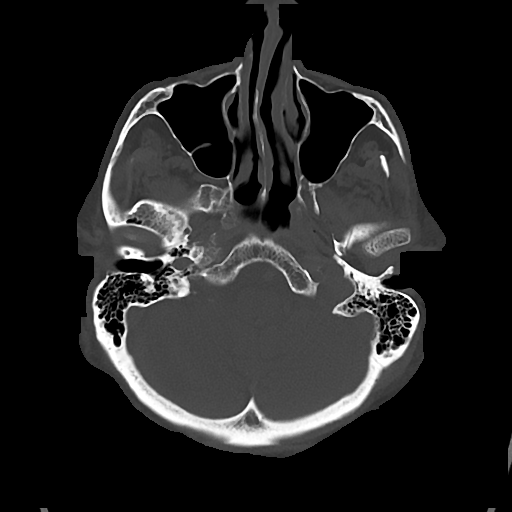
[im 27/89  bone]
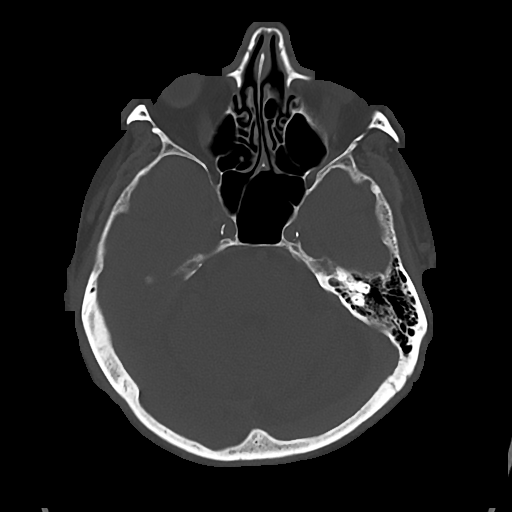
[im 40/89  bone]
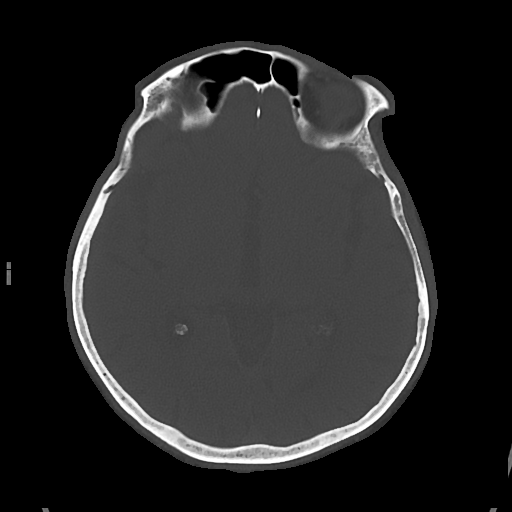

[Series 5: cor soft · coronal · 0.35mm/px · 3 of 70 slices shown]
[im 24/70  brain]
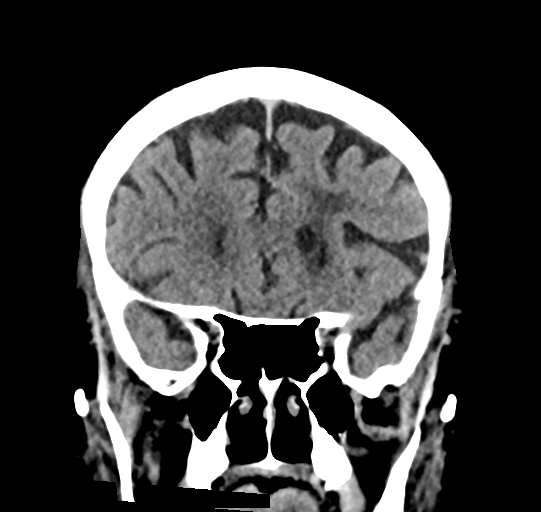
[im 31/70  brain]
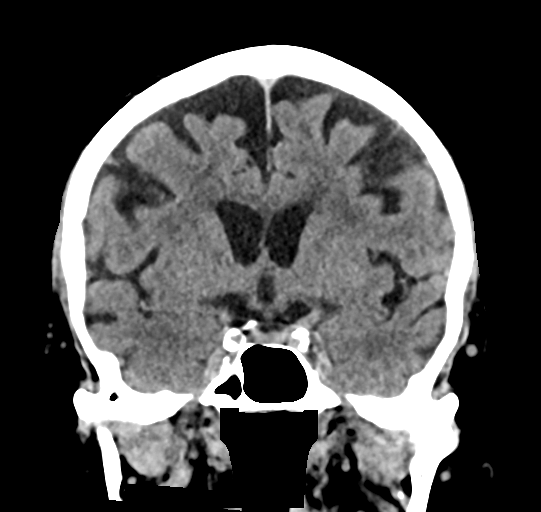
[im 39/70  brain]
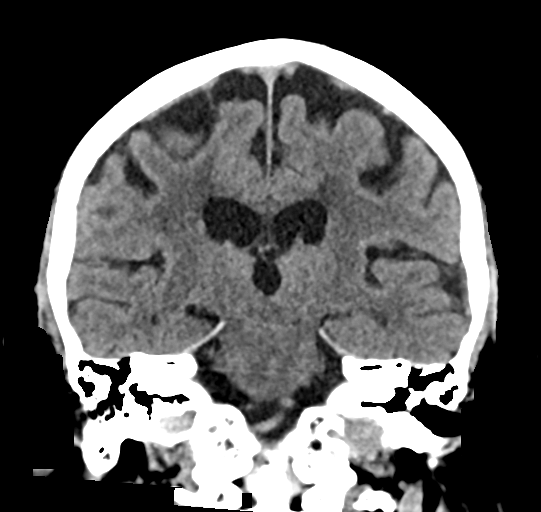

[Series 6: sag soft · sagittal · 0.35mm/px · 3 of 62 slices shown]
[im 21/62  brain]
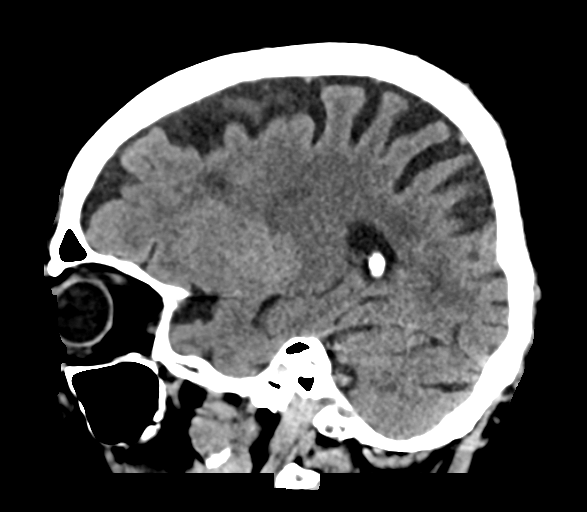
[im 31/62  brain]
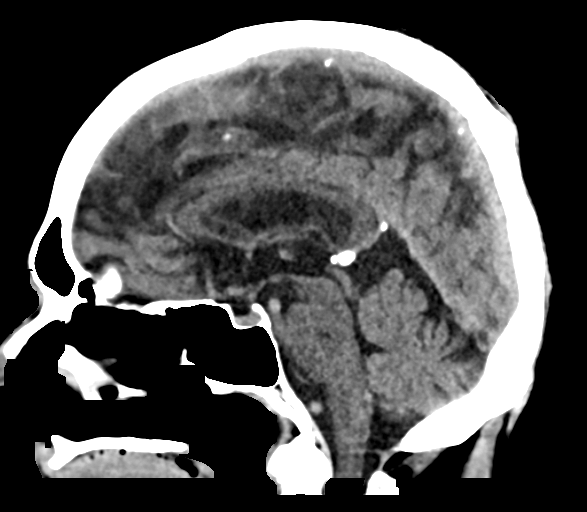
[im 41/62  brain]
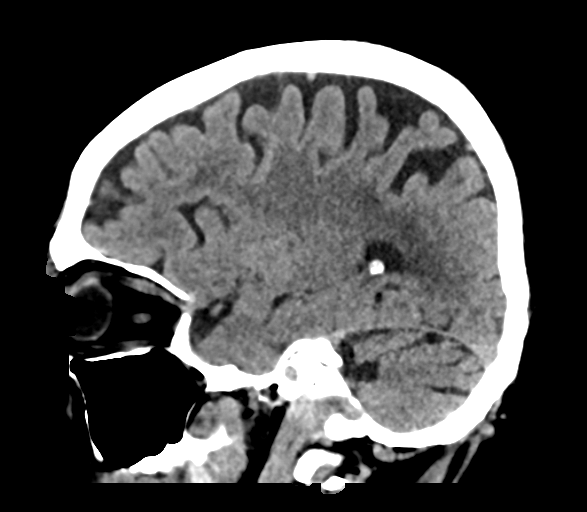

[17 of 47 positions shown; findings below may reference images not displayed]

FINDINGS: Brain: No acute territorial infarction, hemorrhage or intracranial
mass is visualized. Old lacunar infarcts in the pons. Mild to
moderate small vessel ischemic changes of the white matter. Atrophy.
Stable ventricle size

Vascular: No hyperdense vessels.  Carotid artery calcification

Skull: No fracture

Sinuses/Orbits: Mild mucosal thickening in the ethmoid sinuses. No
acute orbital abnormality.

Other: None
IMPRESSION: 1. No CT evidence for acute intracranial abnormality.
2. Atrophy and small vessel ischemic changes of the white matter

## 2019-07-11 IMAGING — DX DG CHEST 1V PORT
1 series · 1 of 1 positions shown · non-contrast
Comparison: 12/17/2016

CLINICAL DATA: Mental status changes.

EXAM:
PORTABLE CHEST 1 VIEW

[chest]
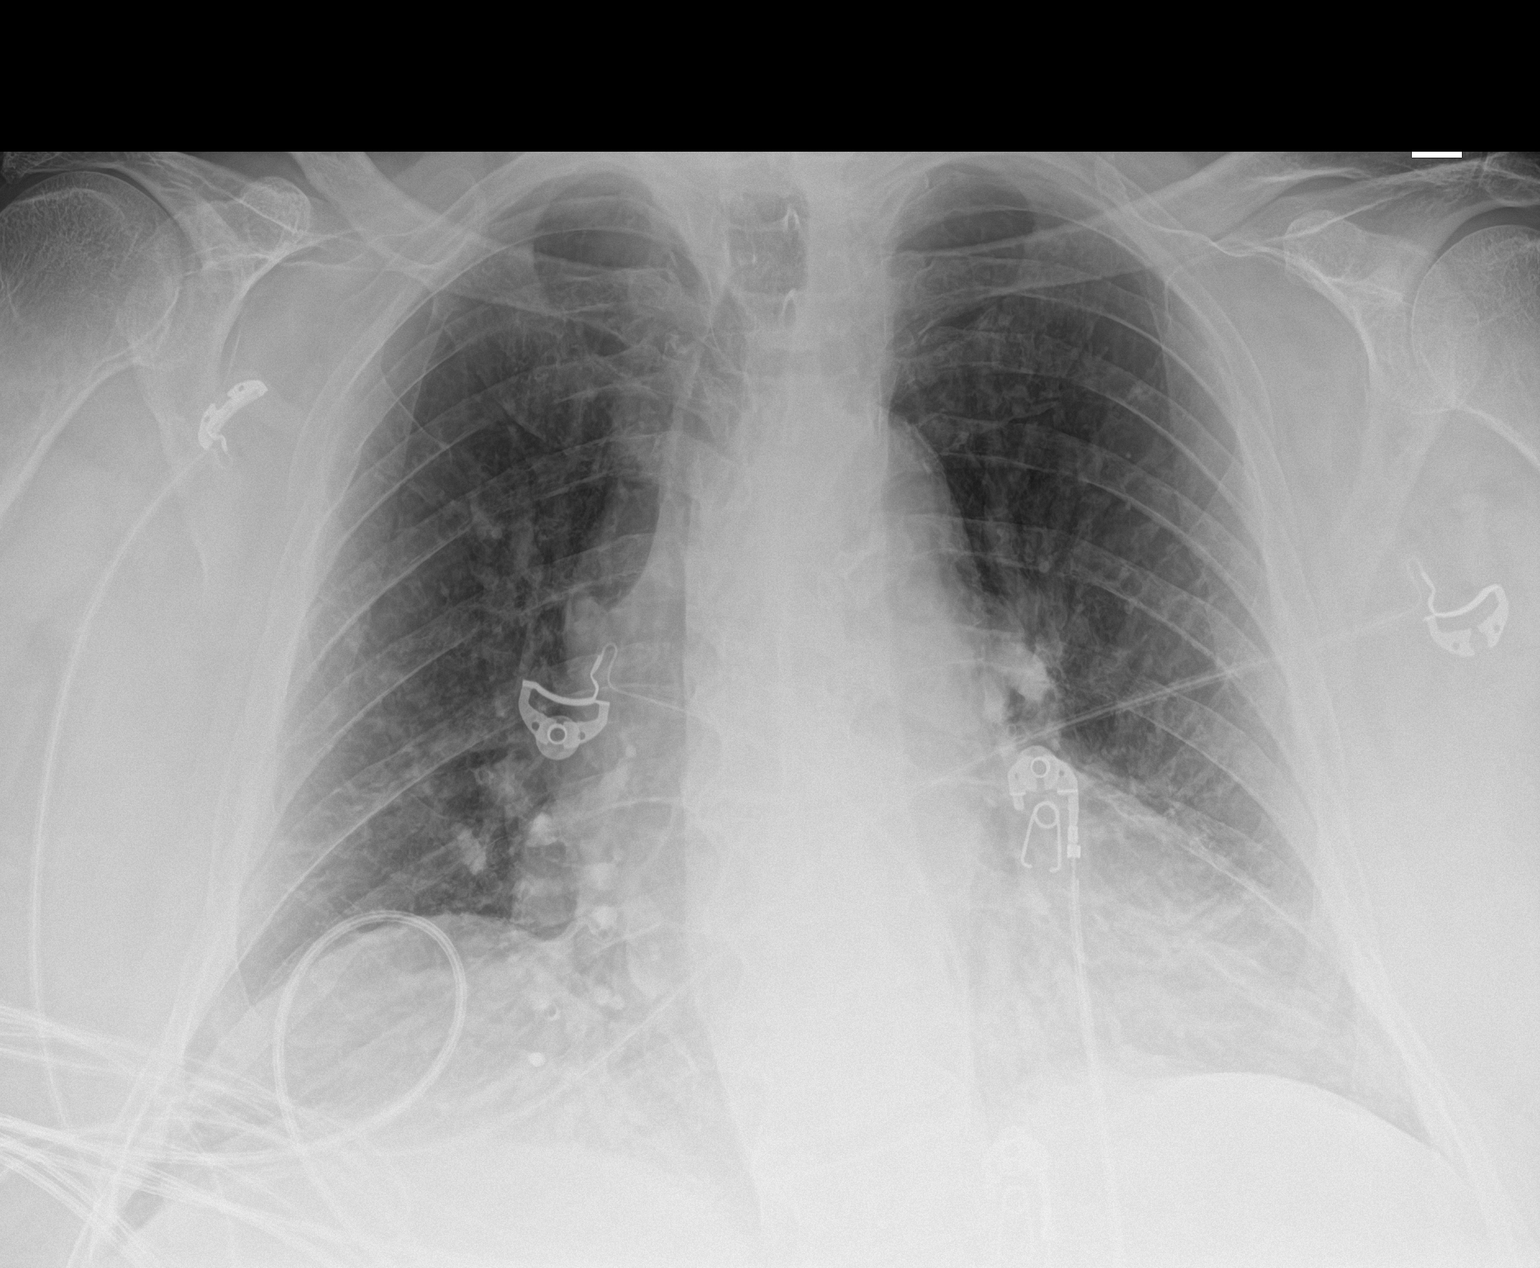

[1 of 1 positions shown; findings below may reference images not displayed]

FINDINGS: 8886 hours. Low lung volumes. There is pulmonary vascular congestion
without overt pulmonary edema. The lungs are clear without focal
pneumonia, edema, pneumothorax or pleural effusion. Similar
appearance atelectasis at the left base. The cardio pericardial
silhouette is enlarged. The visualized bony structures of the thorax
are intact. Telemetry leads overlie the chest.
IMPRESSION: 1. Low volume film with cardiomegaly and vascular congestion.
2. Similar appearance left base atelectasis.

## 2019-07-13 IMAGING — CR DG HAND COMPLETE 3+V*R*
3 series · 3 of 3 positions shown · non-contrast
Comparison: 12/27/2016

CLINICAL DATA: Fall several days ago.

EXAM:
RIGHT HAND - COMPLETE 3+ VIEW

[PA]
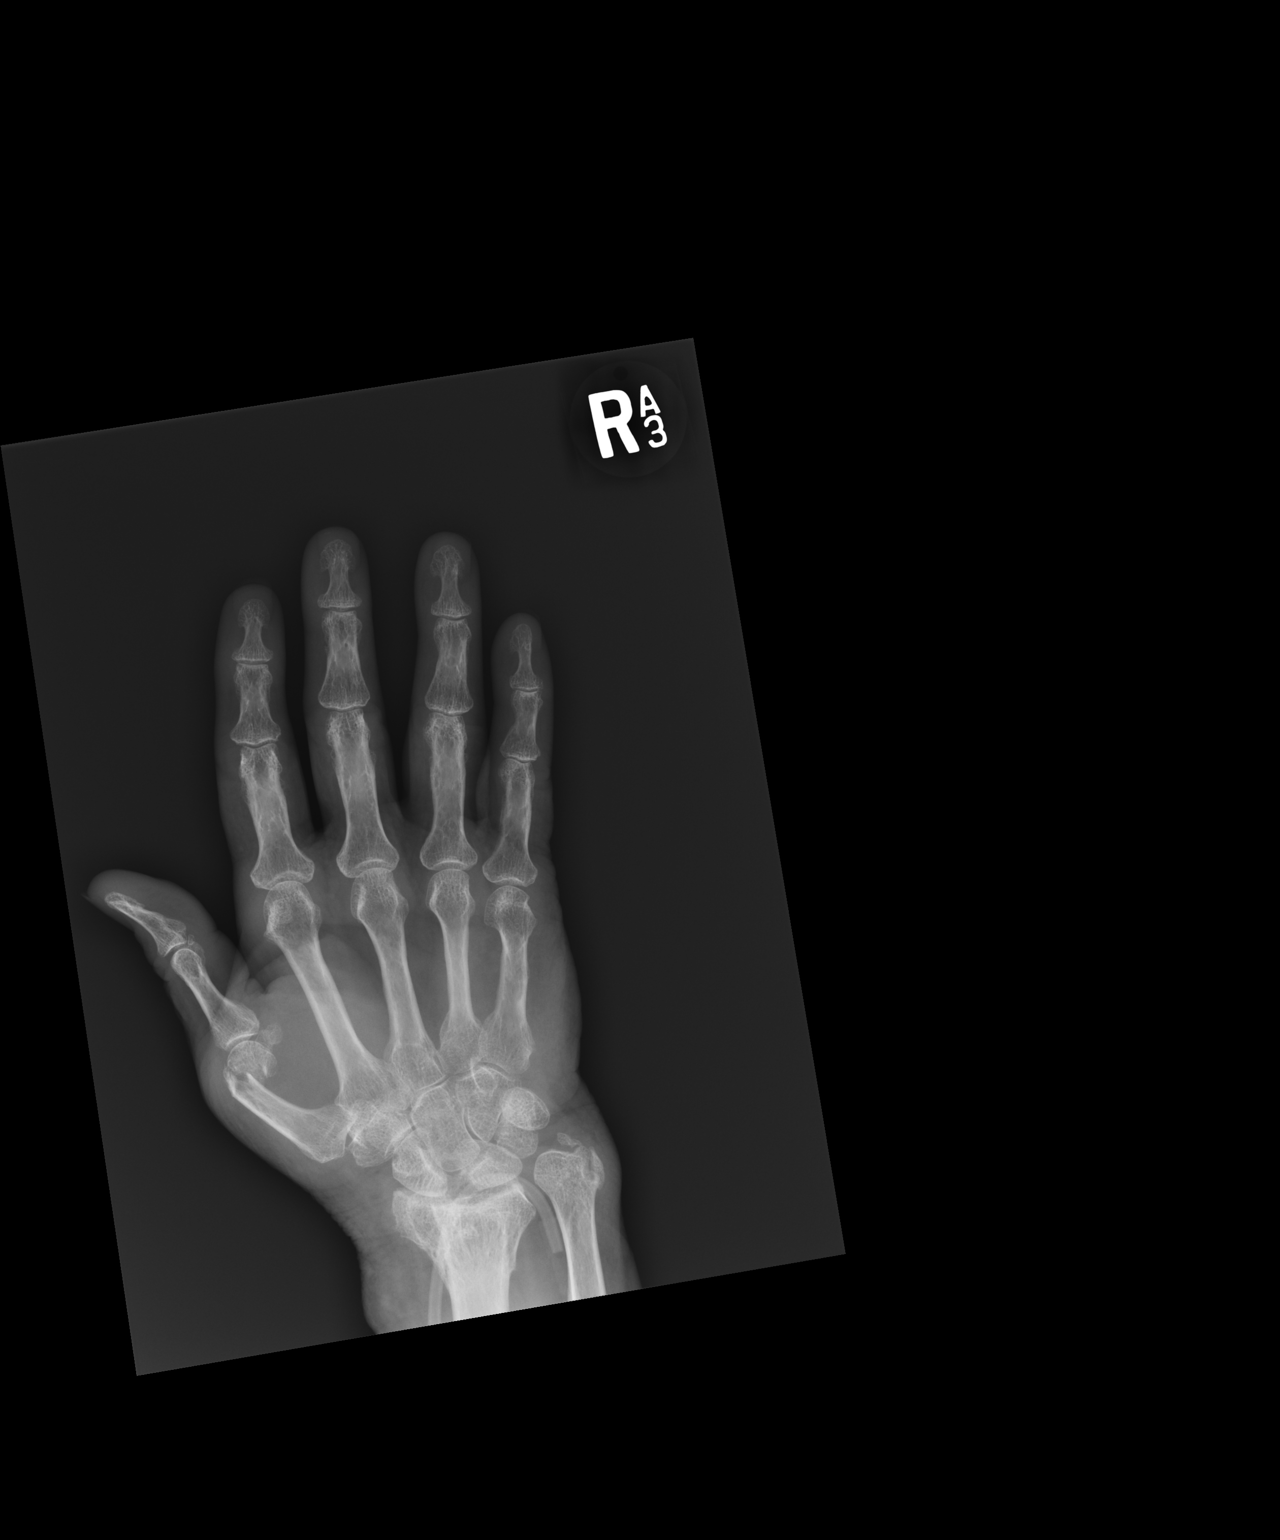

[lateral]
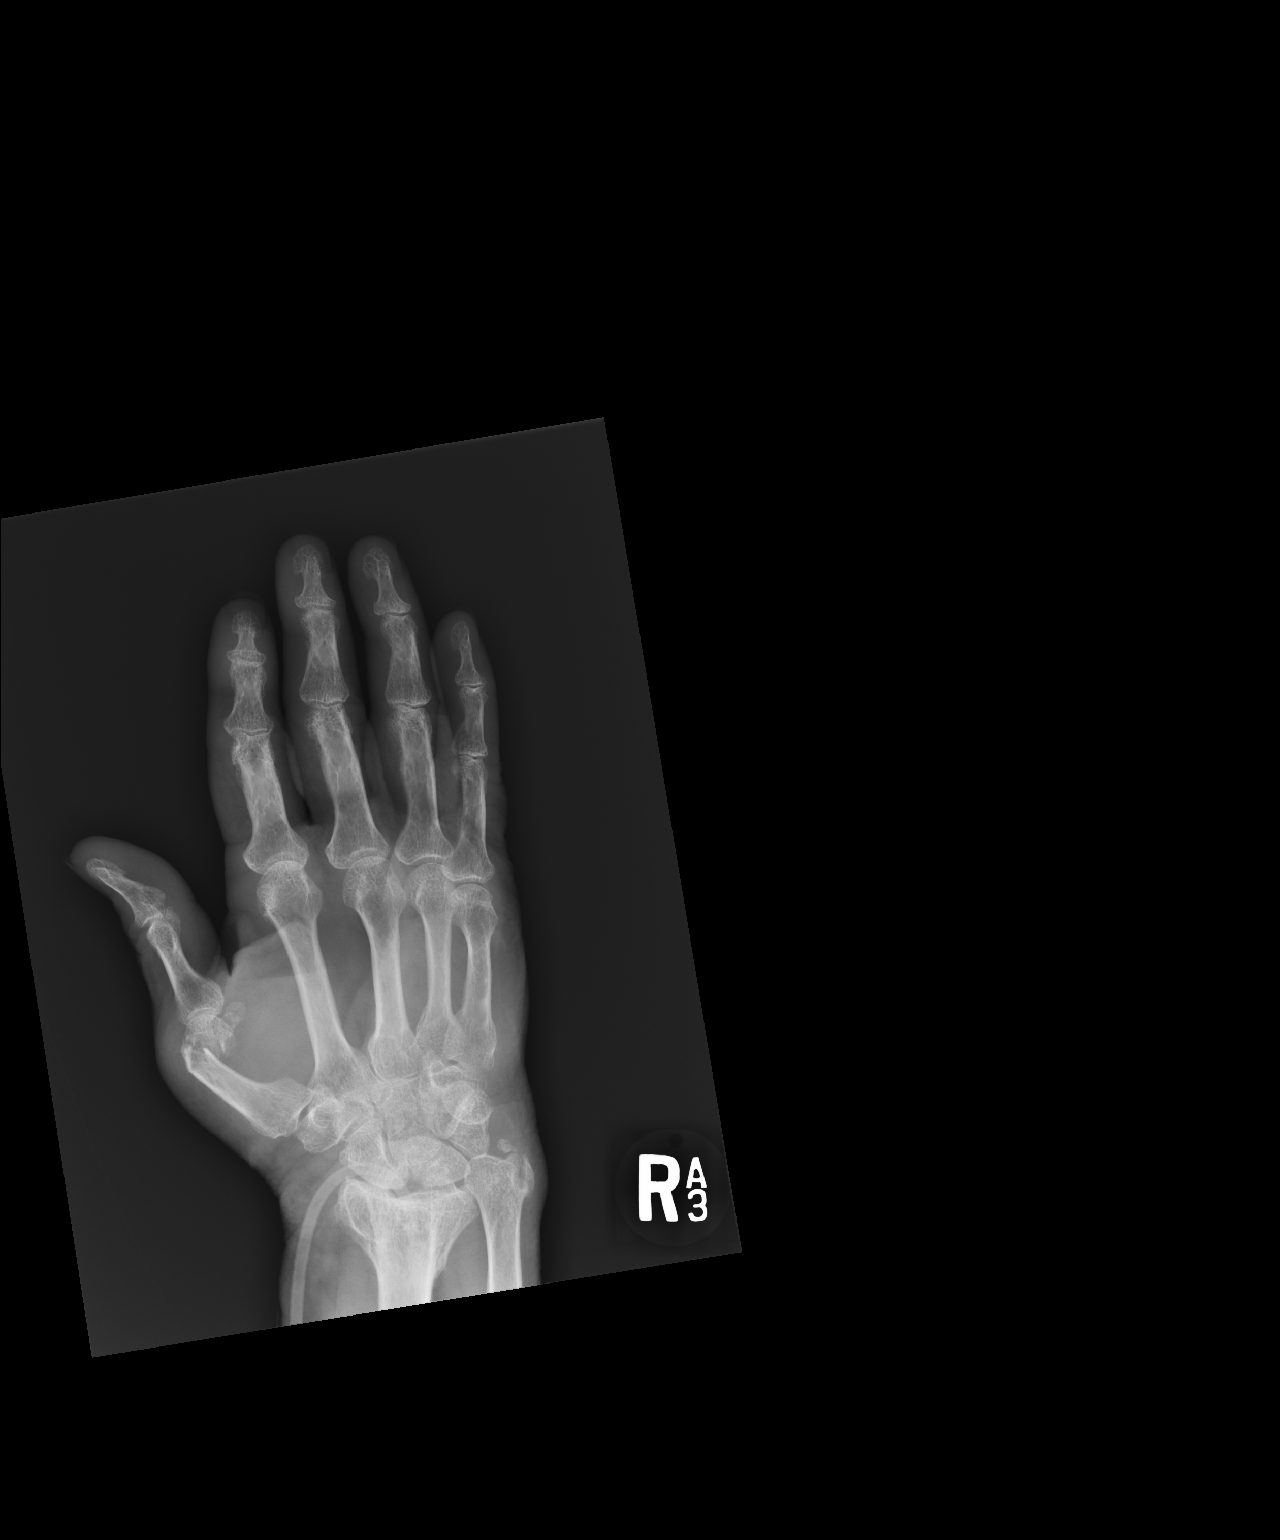

[pa obl]
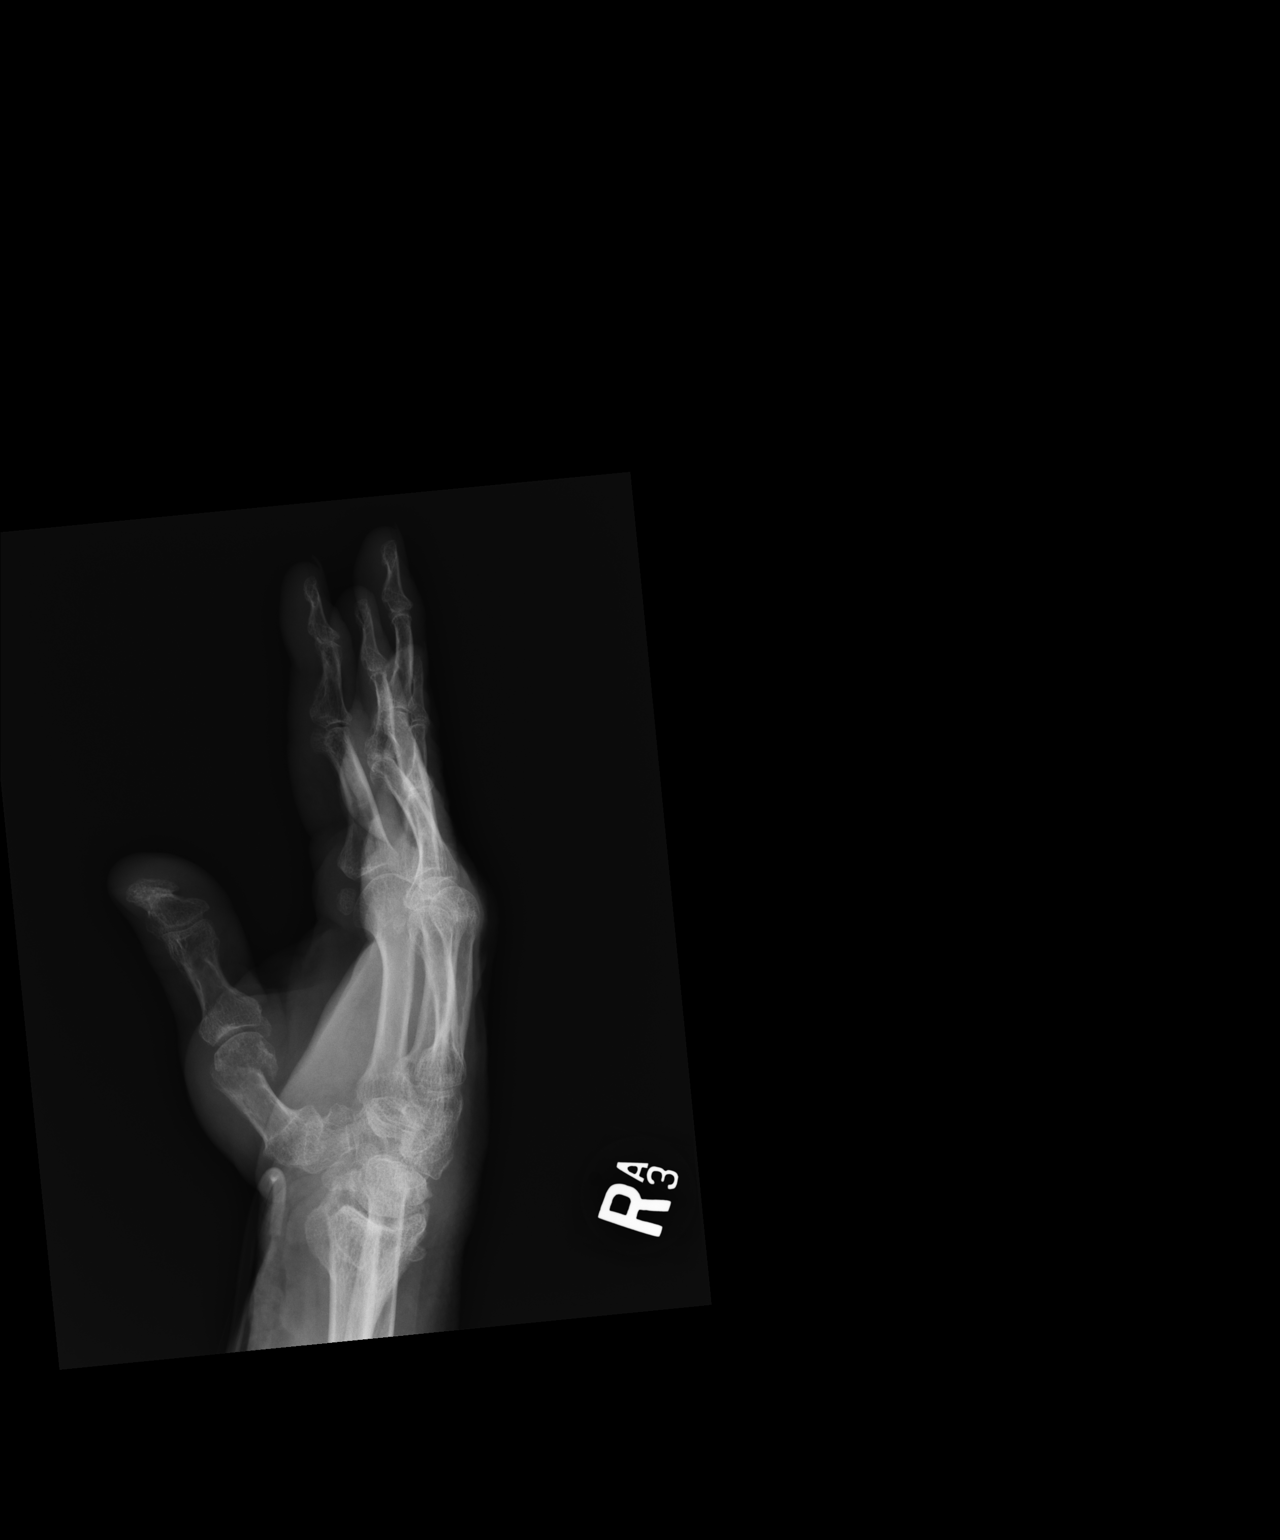

[3 of 3 positions shown; findings below may reference images not displayed]

FINDINGS: Deformity in the distal radius and ulna from old injury. Angulated
fracture through the right first metacarpal again noted with
increasing angulation. No new fracture. Evidence of scapholunate
dissociation with widening, stable.
IMPRESSION: Increasing angulation at the distal right first metacarpal fracture.

stable scapholunate dissociation.

## 2019-07-13 IMAGING — CR DG WRIST COMPLETE 3+V*R*
4 series · 4 of 4 positions shown · non-contrast
Comparison: 12/27/2016

CLINICAL DATA: Fall several days ago.  Pain.

EXAM:
RIGHT WRIST - COMPLETE 3+ VIEW

[PA]
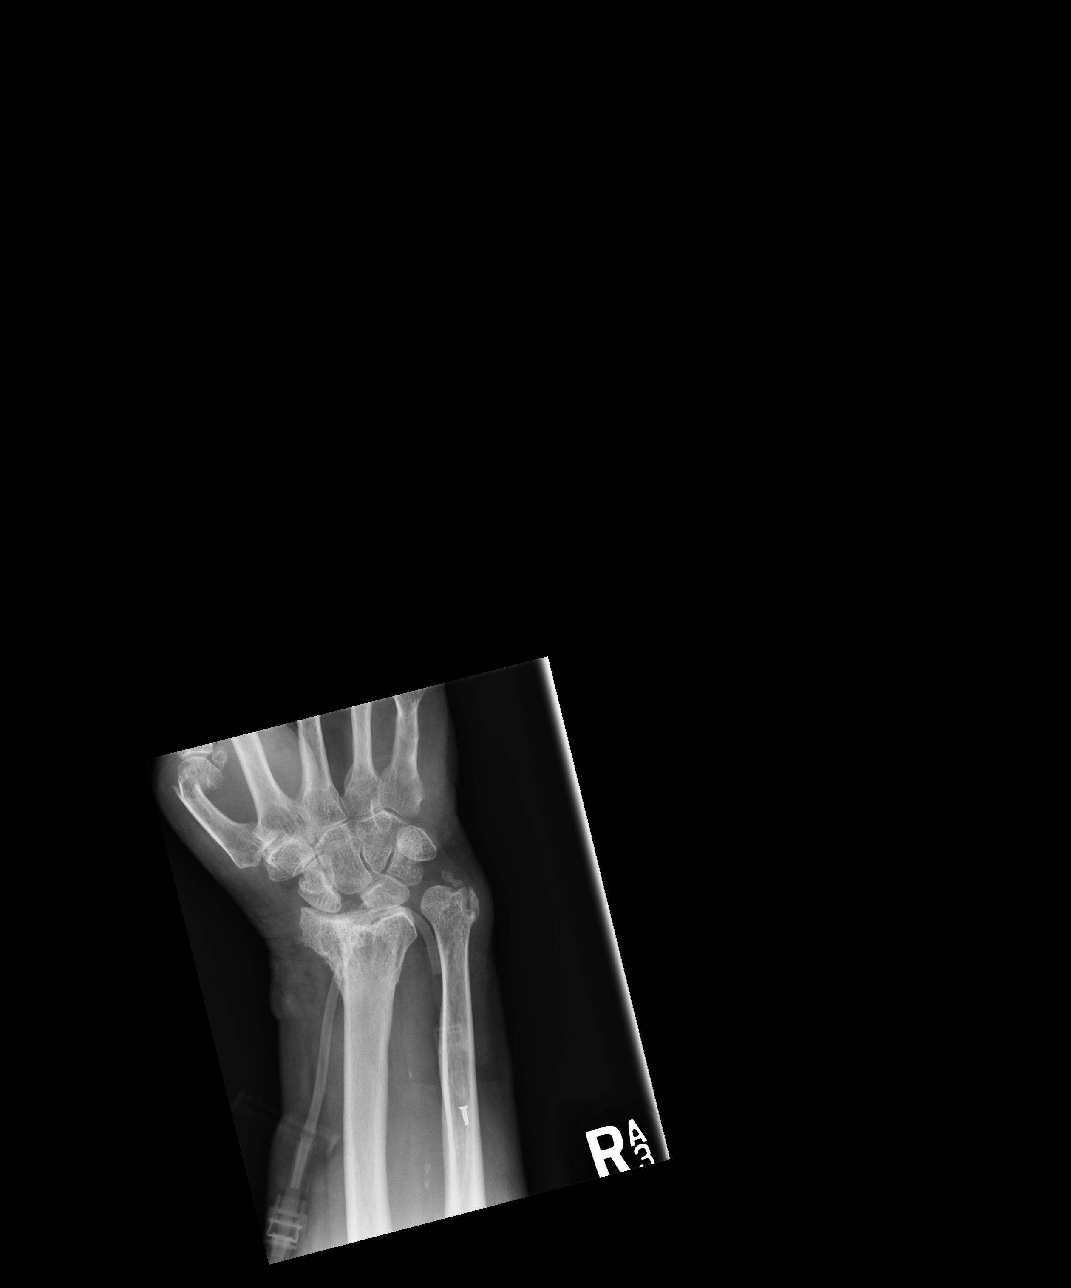

[ap ext rot (1 of 2)]
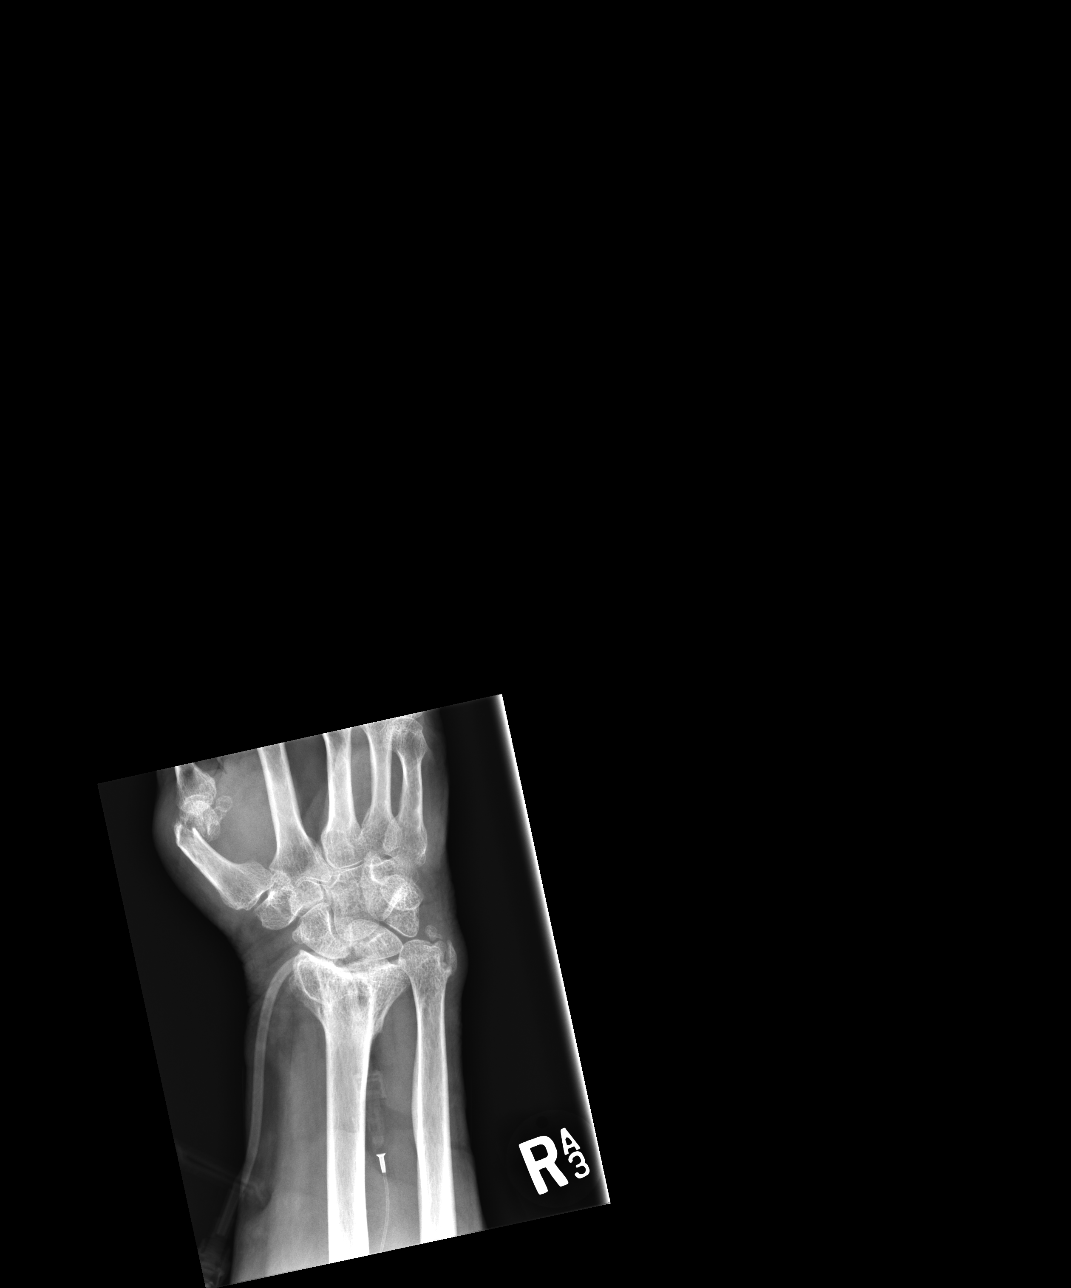

[ap ext rot (2 of 2)]
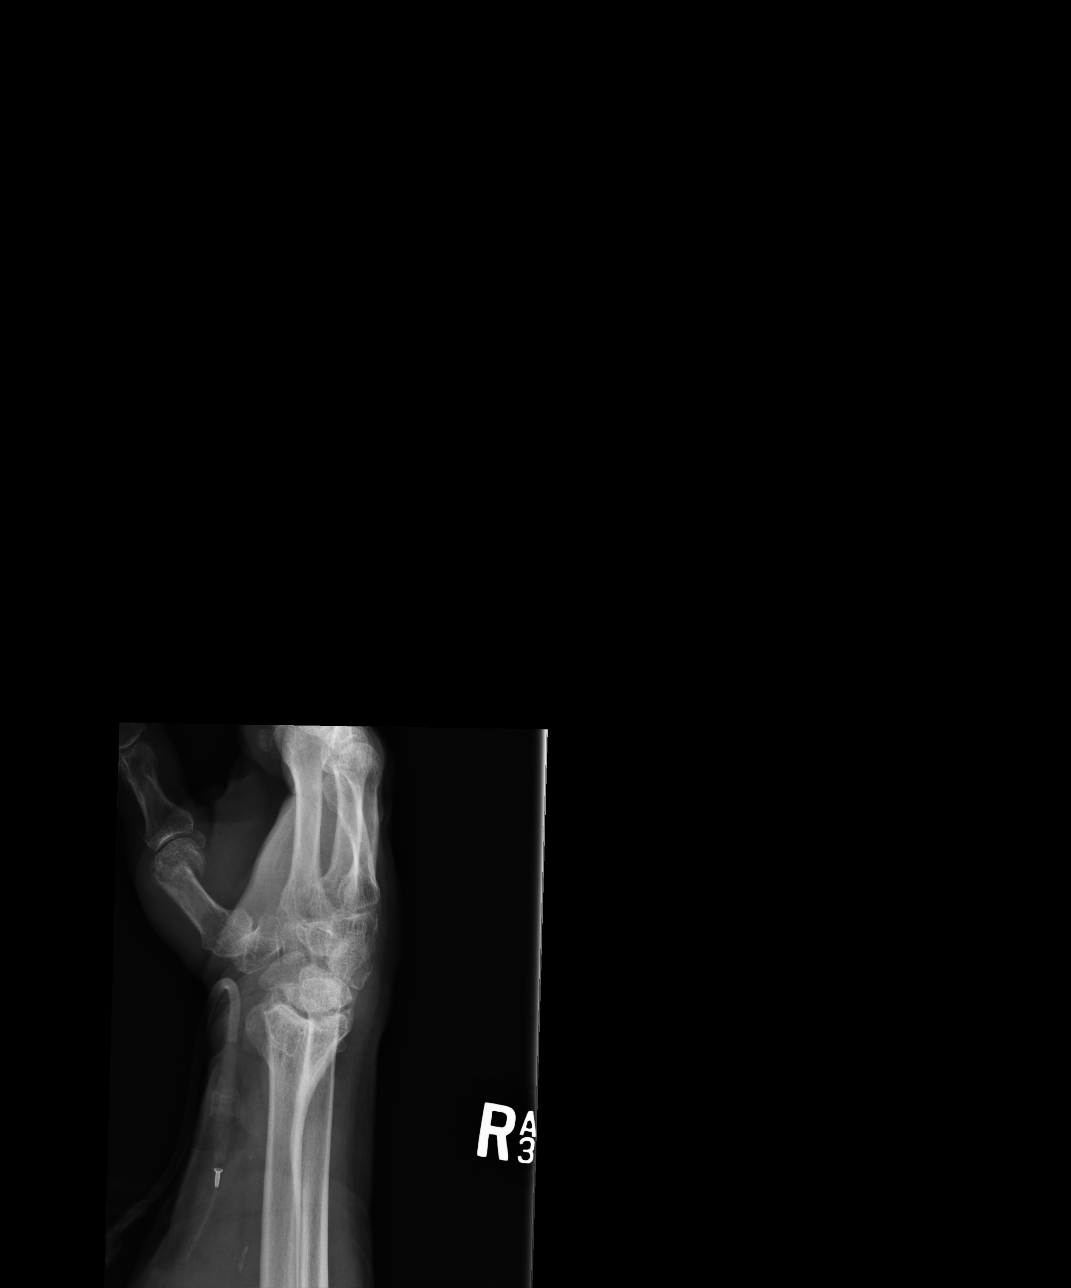

[lateral]
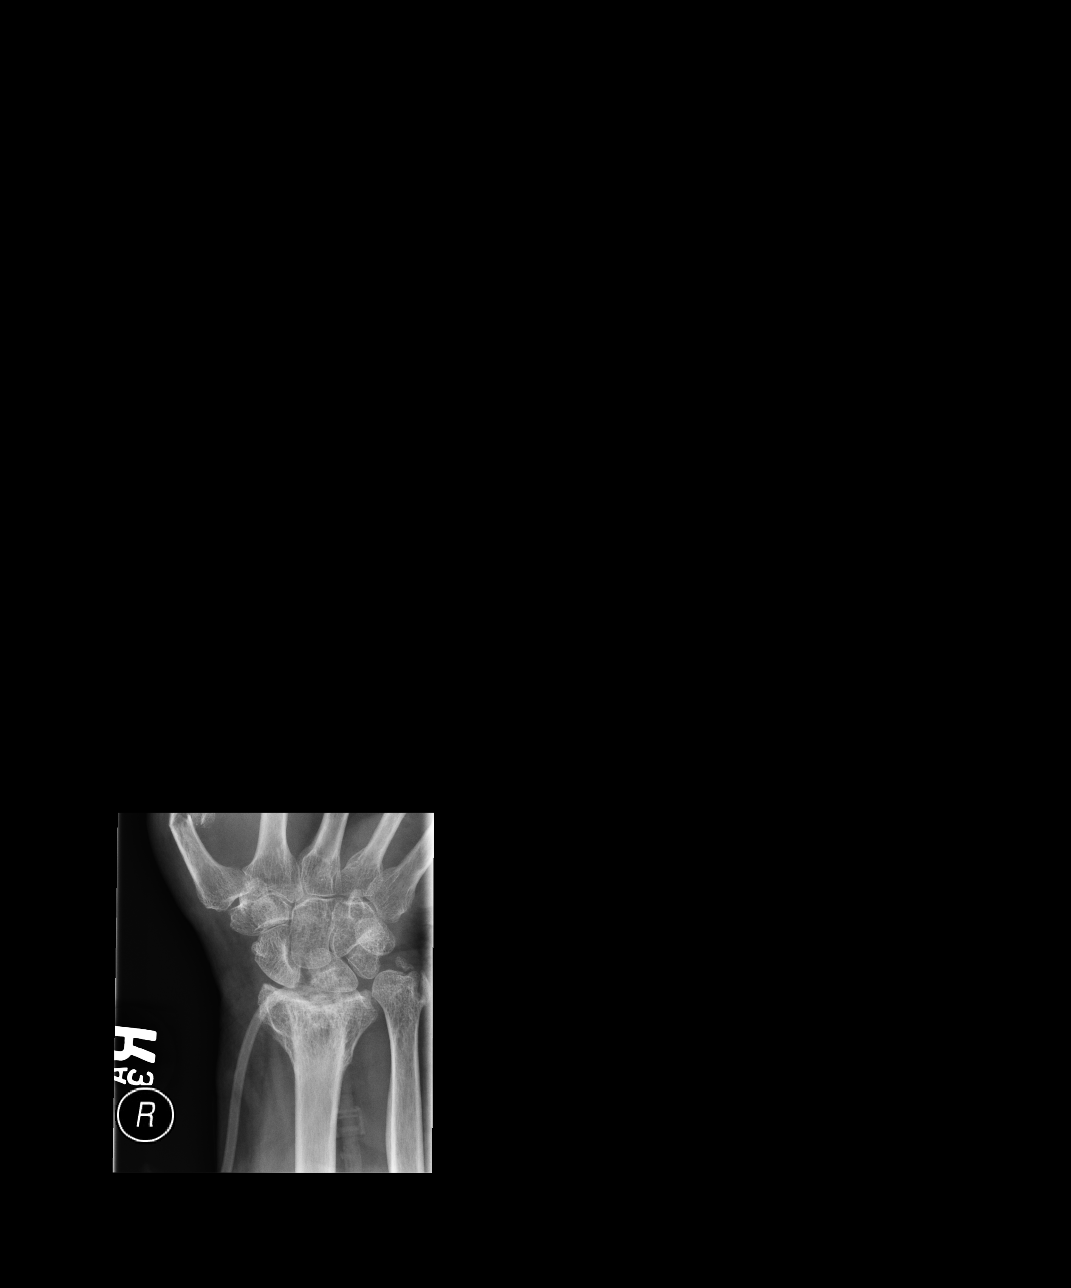

[4 of 4 positions shown; findings below may reference images not displayed]

FINDINGS: Fracture again noted through the distal right first metacarpal with
increasing angulation. Old healed distal radial and ulnar fractures
with deformity. Widening of the scapholunate space compatible with
dissociation, stable. No new fracture.
IMPRESSION: Increasing angulation through the scratched at increasing angulation
at the distal right first metacarpal fracture.

Scapholunate dissociation, stable.

## 2019-08-03 IMAGING — CR DG CHEST 2V
3 series · 3 of 3 positions shown · non-contrast
Comparison: Radiograph January 13, 2017.

CLINICAL DATA: Altered mental status after fall today.

EXAM:
CHEST  2 VIEW

[chest lat (1 of 2)]
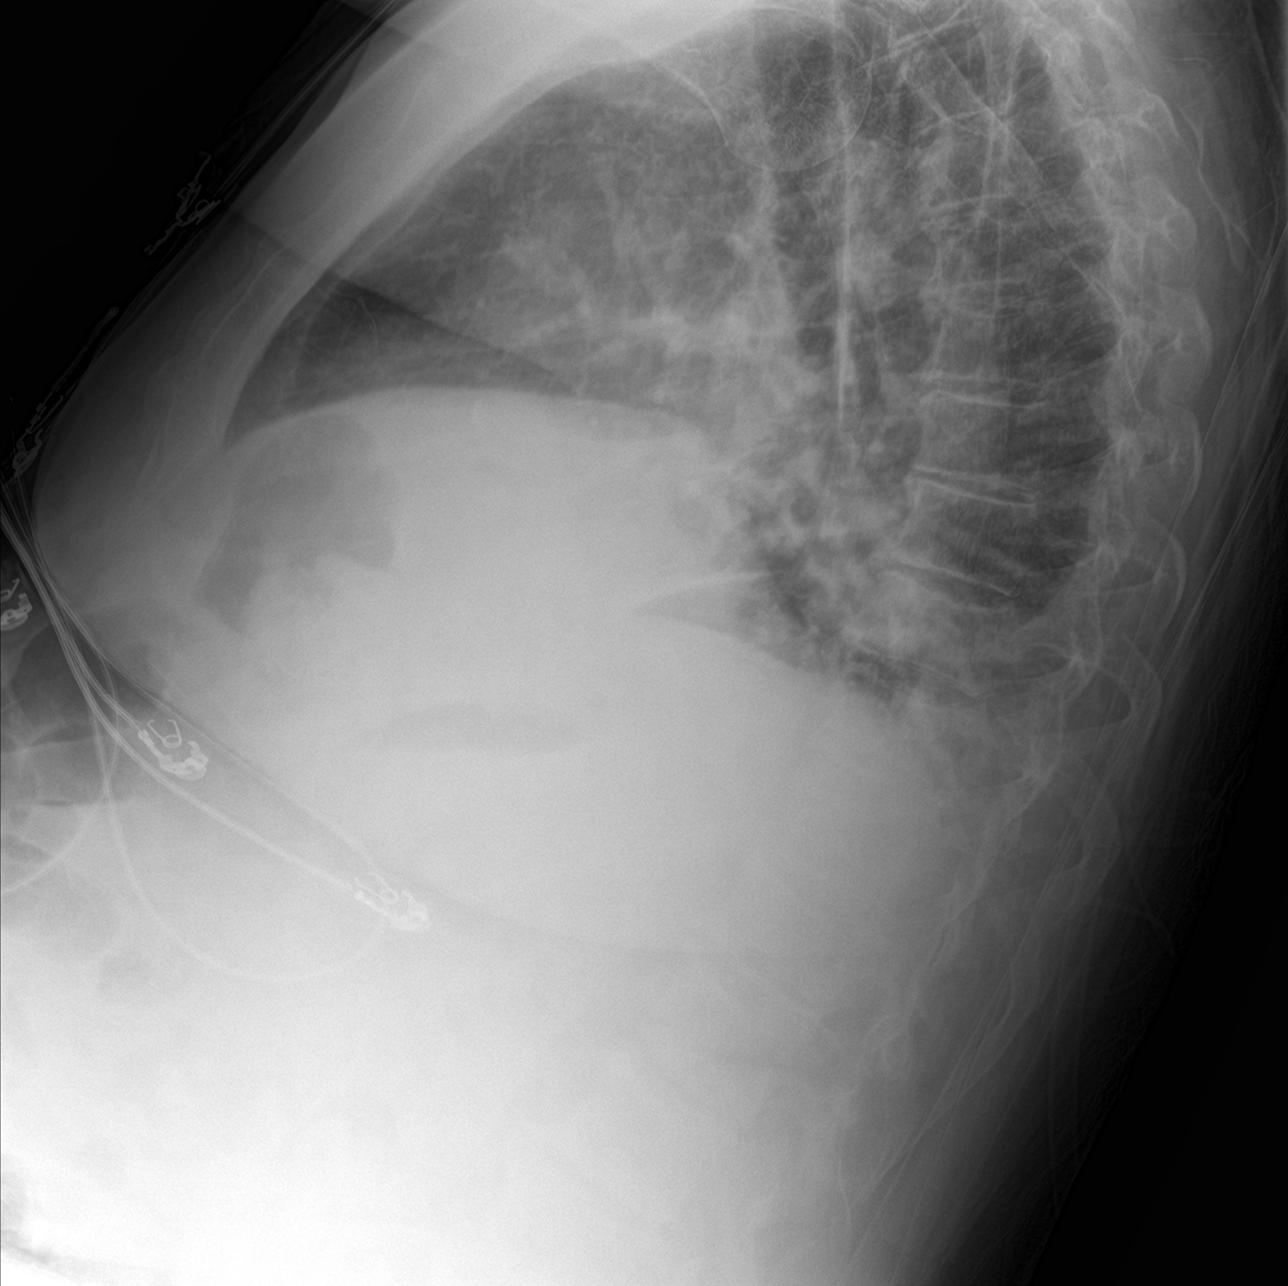

[chest ap]
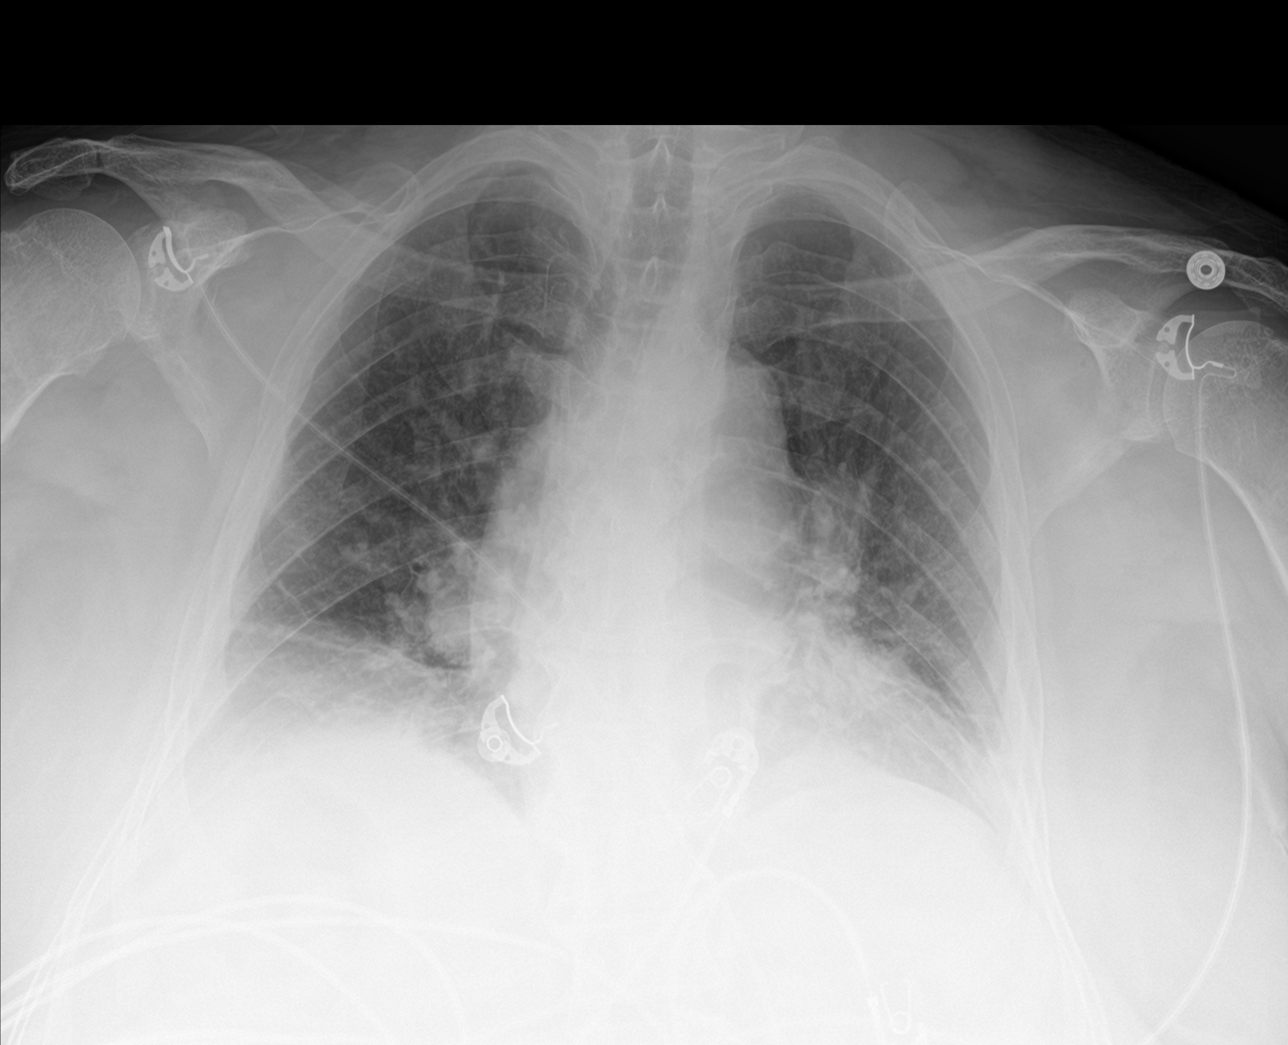

[chest lat (2 of 2)]
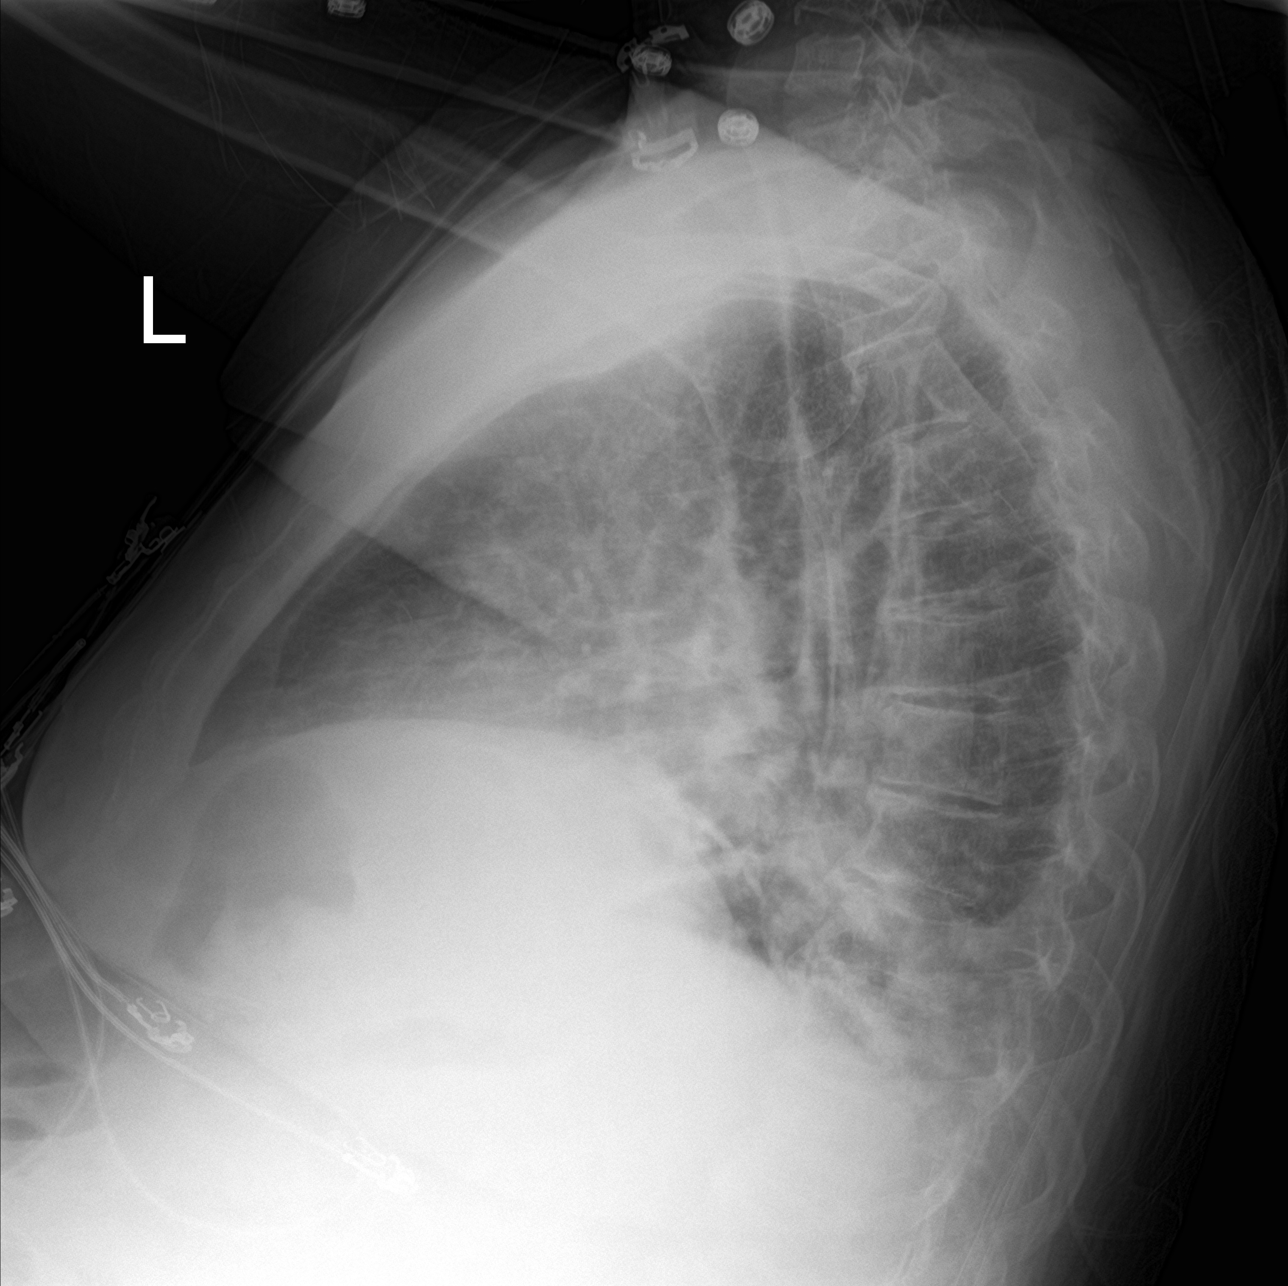

[3 of 3 positions shown; findings below may reference images not displayed]

FINDINGS: Stable cardiomegaly. No pneumothorax is noted. Mild left basilar
atelectasis is noted. Mild right pleural effusion is noted with
associated atelectasis. Bony thorax is unremarkable.
IMPRESSION: Mild bibasilar subsegmental atelectasis with mild right pleural
effusion.

## 2019-08-03 IMAGING — CR DG HAND COMPLETE 3+V*R*
3 series · 3 of 3 positions shown · non-contrast
Comparison: Radiograph January 15, 2017.

CLINICAL DATA: Right wrist deformity after fall today.

EXAM:
RIGHT HAND - COMPLETE 3+ VIEW

[hand pa]
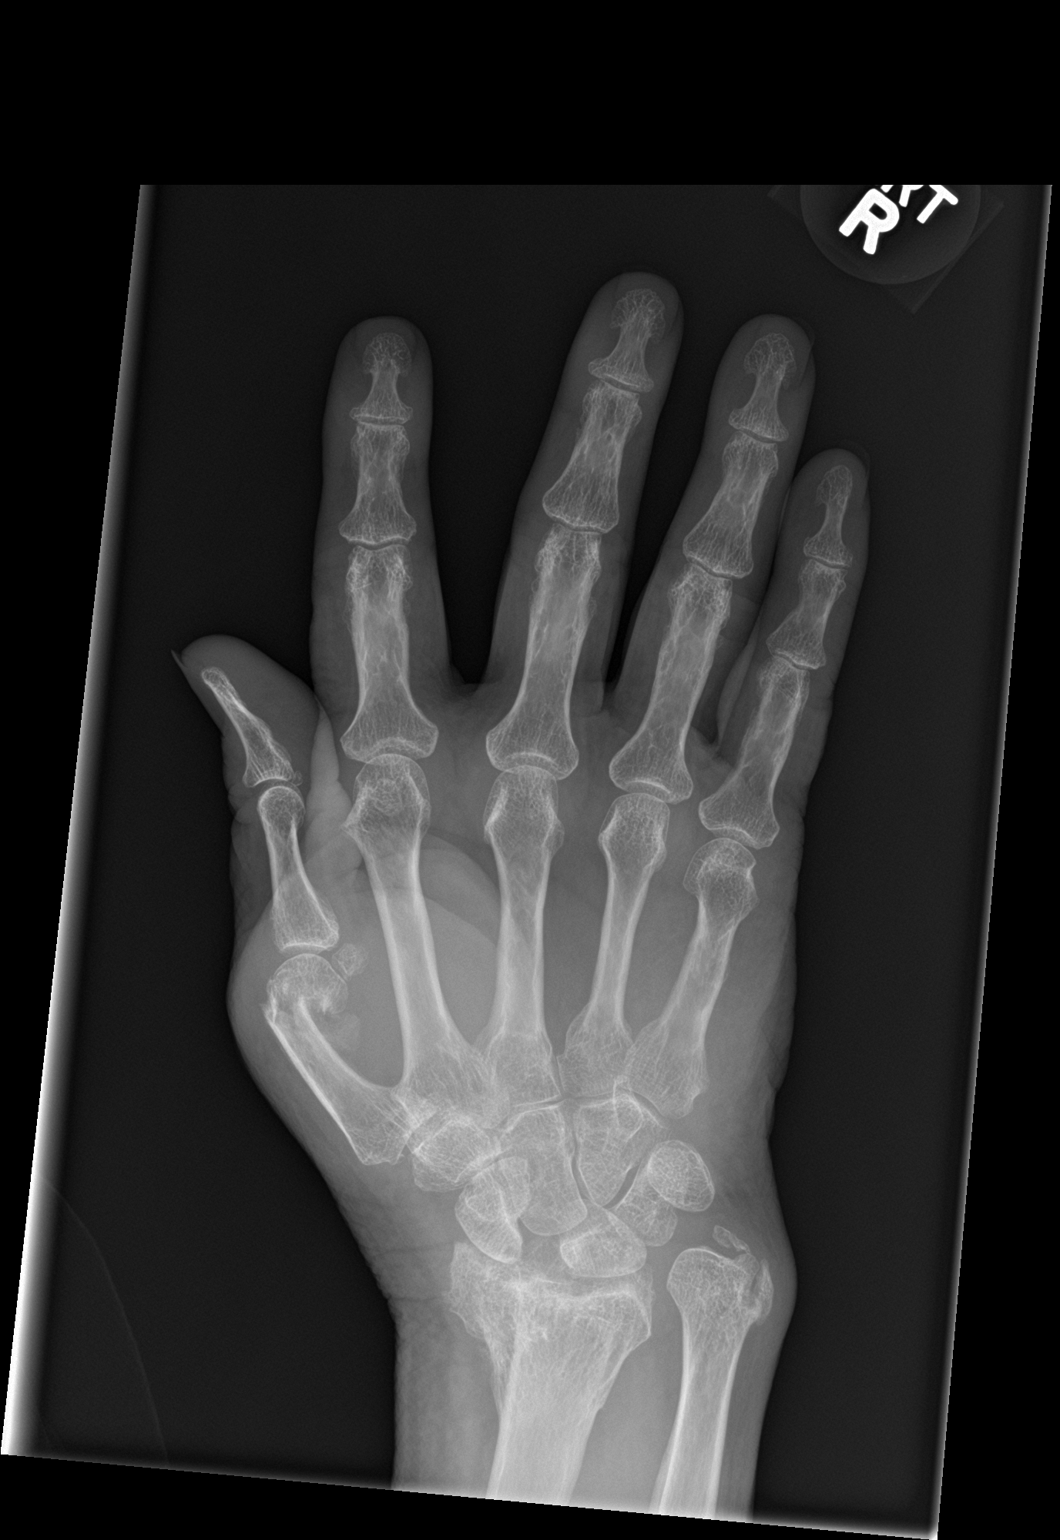

[hand obl]
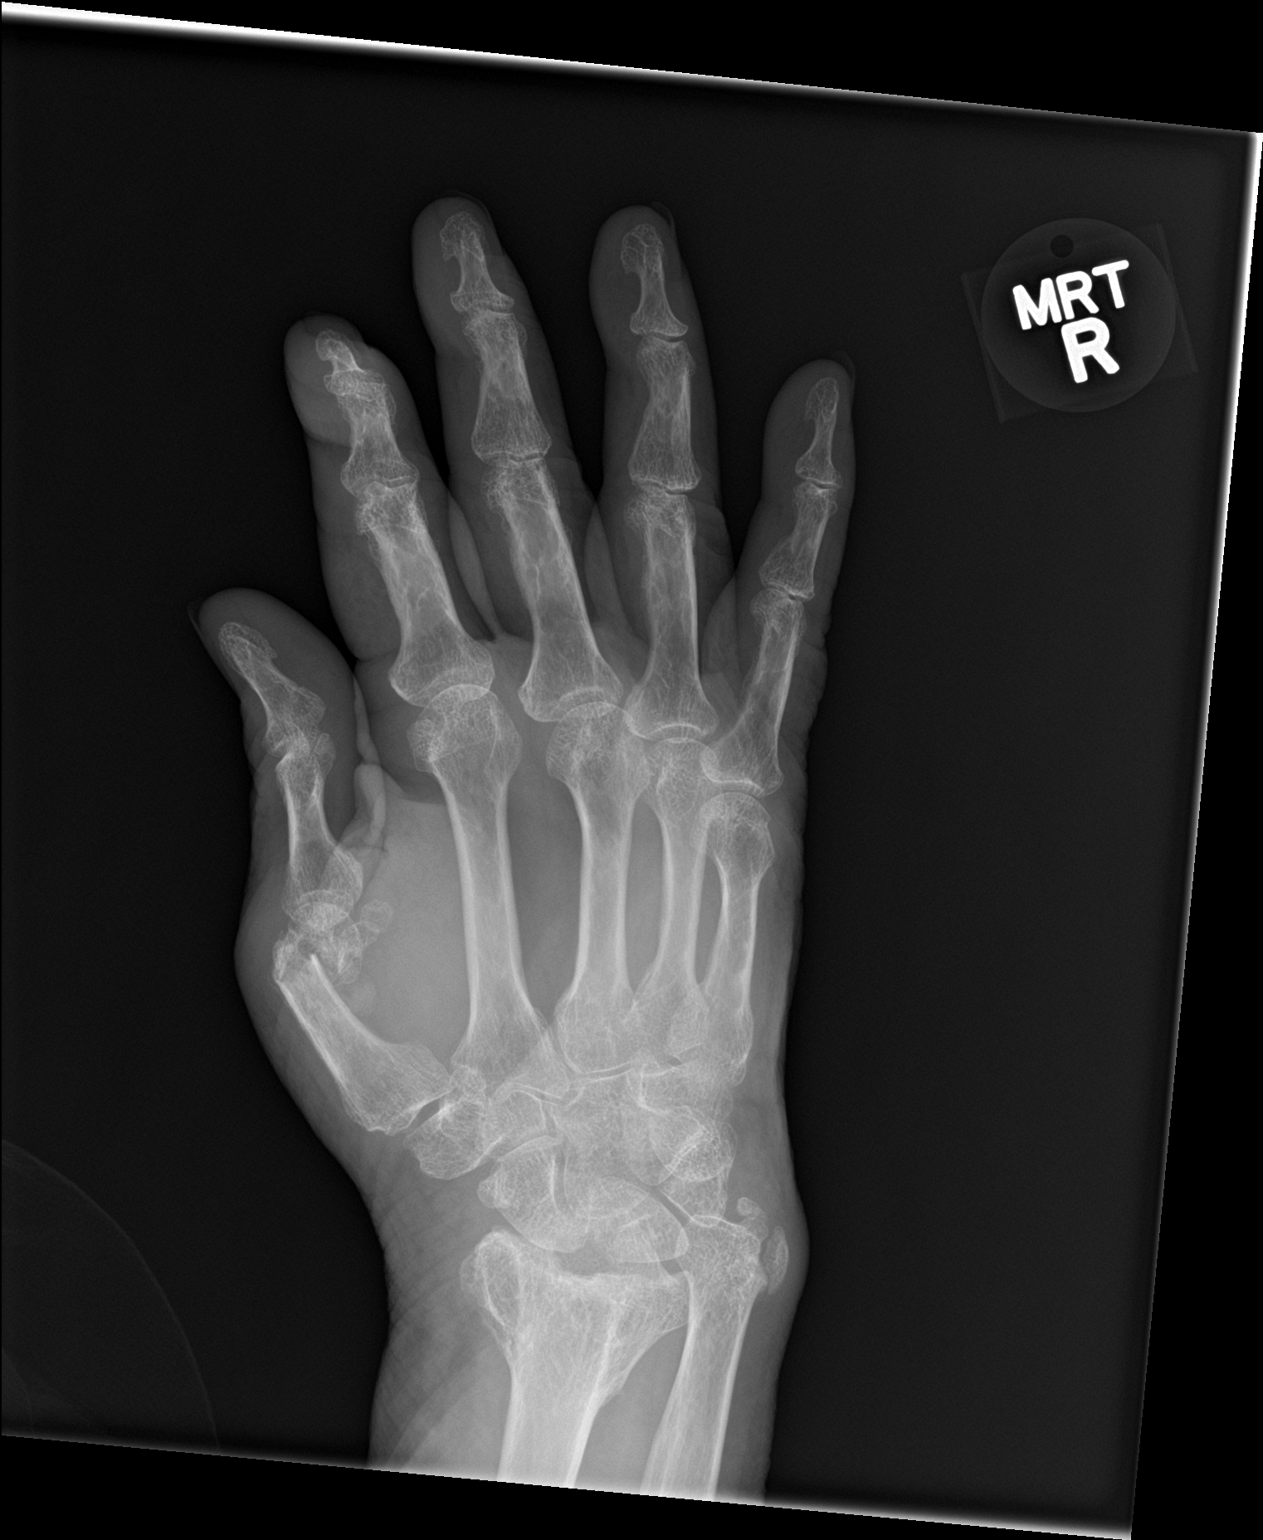

[hand lat]
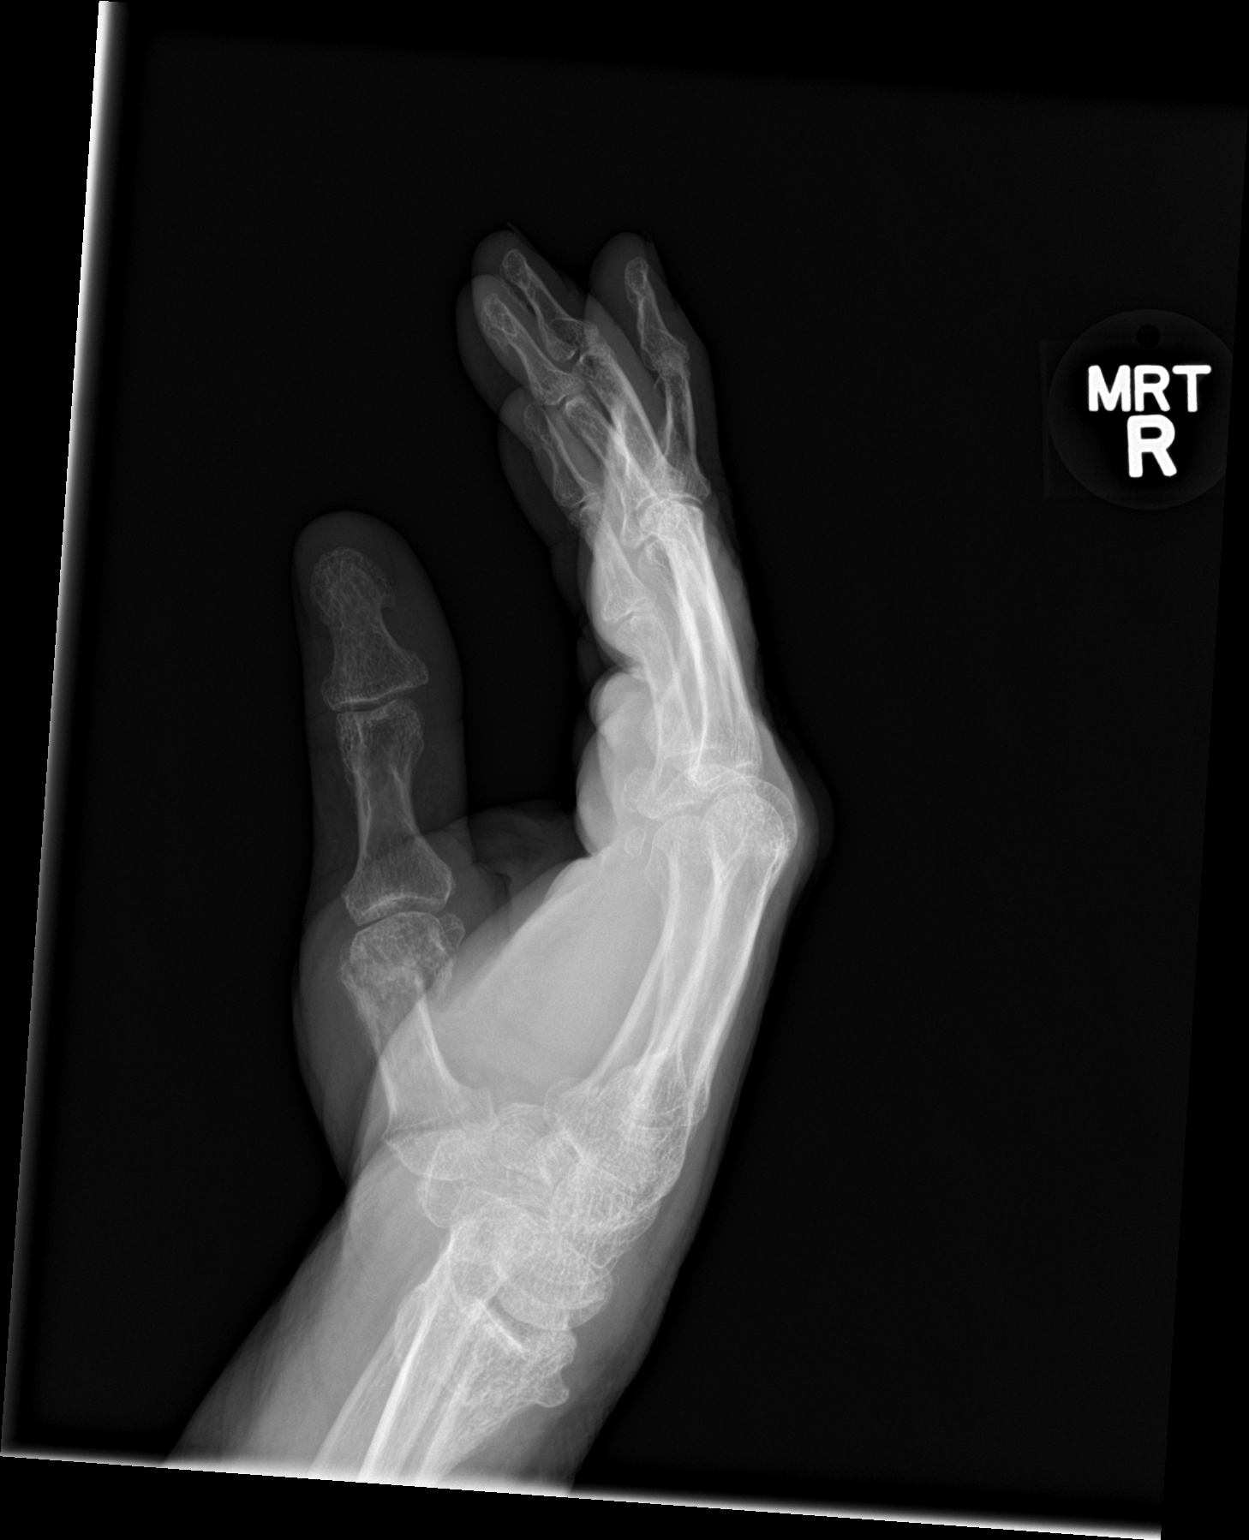

[3 of 3 positions shown; findings below may reference images not displayed]

FINDINGS: Continued presence of moderately displaced fracture involving distal
portion of first metacarpal. Stable widening of scapholunate space
is noted. No new fracture or dislocation is noted.
IMPRESSION: Continued presence of moderately displaced distal first metacarpal
fracture. Stable widening of scapholunate space is noted suggesting
ligamentous injury. No new abnormality is noted.

## 2019-08-03 IMAGING — CT CT HEAD W/O CM
4 series · 16 of 47 positions shown, 18 images · non-contrast
Comparison: None.

CLINICAL DATA: Altered mental status beginning this morning.
01/13/2017

EXAM:
CT HEAD WITHOUT CONTRAST
TECHNIQUE: Contiguous axial images were obtained from the base of the skull
through the vertex without intravenous contrast.

[Series 3: head bone · axial · 0.43mm/px · z∈[-112,-80]mm · 3 of 83 slices shown]
[im 9/83  bone]
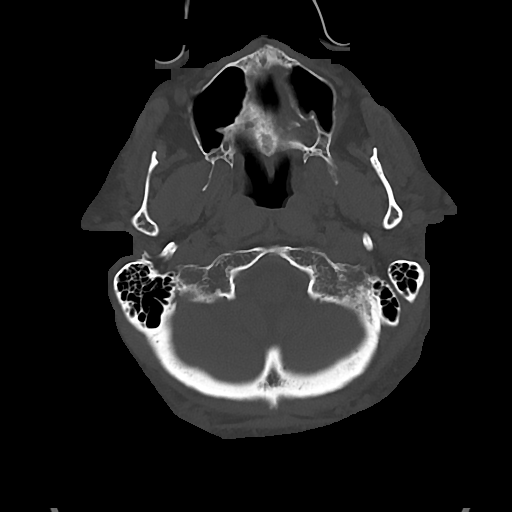
[im 17/83  bone]
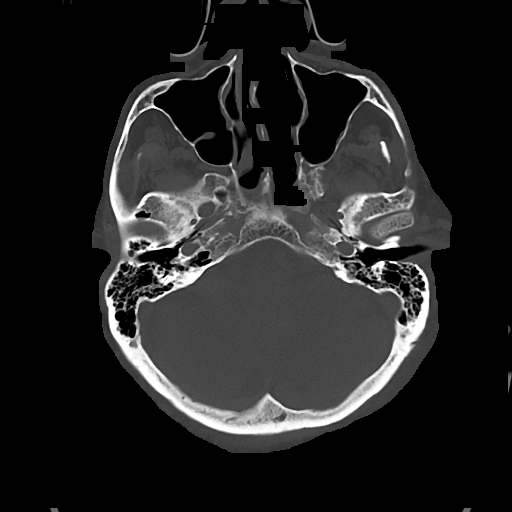
[im 25/83  bone]
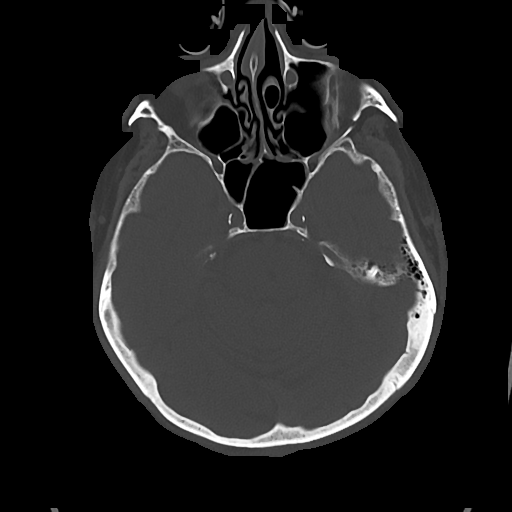

[Series 4: head wo · axial · 0.43mm/px · z∈[-108,+12]mm · 7 of 33 slices shown, 9 images]
[im 5/33  brain]
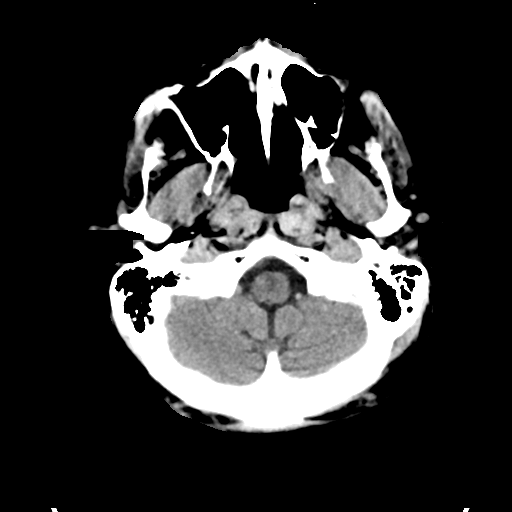
[im 5/33  bone]
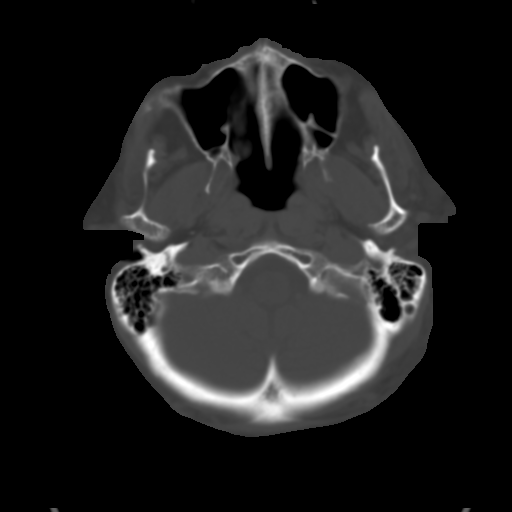
[im 9/33  brain]
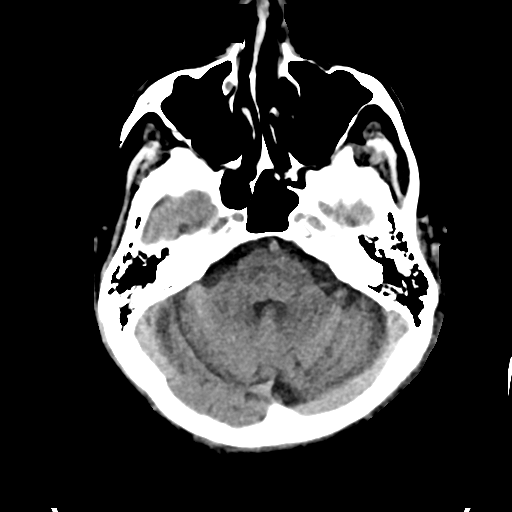
[im 13/33  brain]
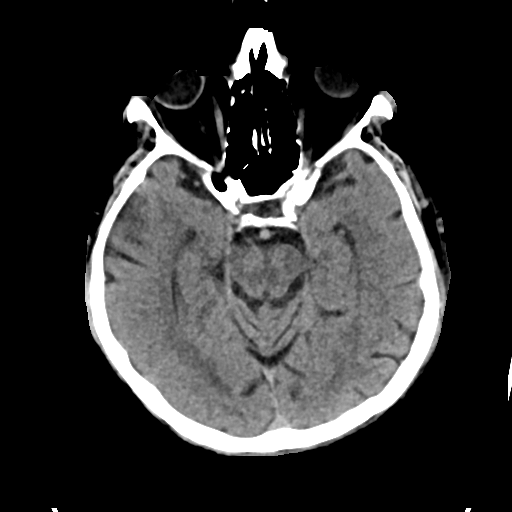
[im 17/33  brain]
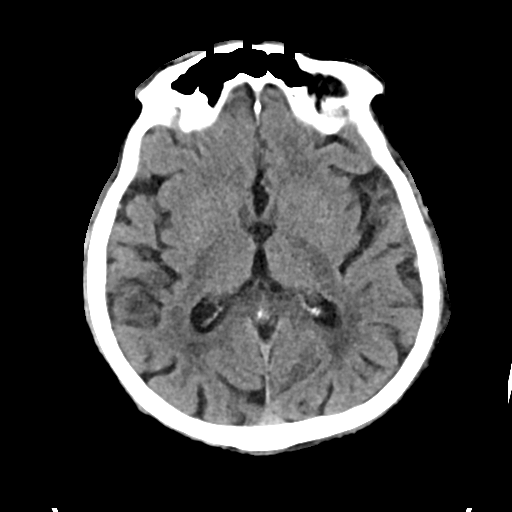
[im 21/33  brain]
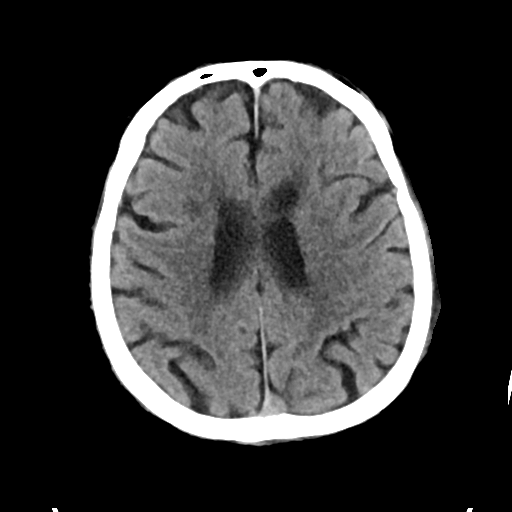
[im 21/33  bone]
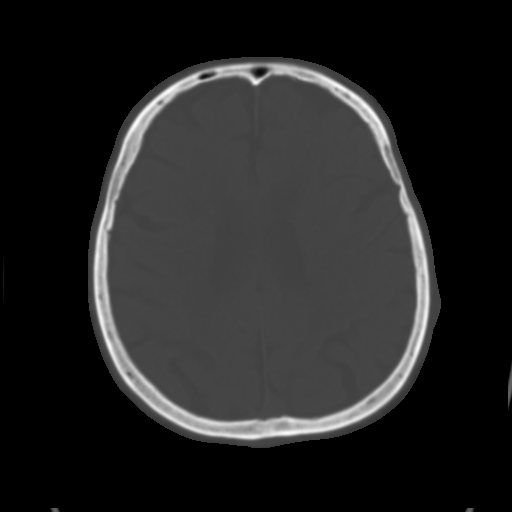
[im 25/33  brain]
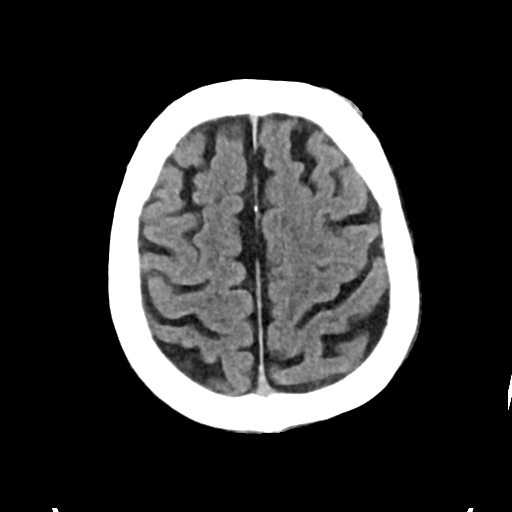
[im 29/33  brain]
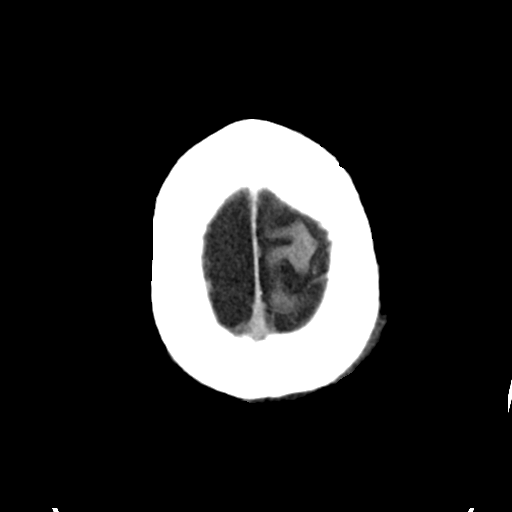

[Series 5: cor soft · coronal · 0.34mm/px · 3 of 62 slices shown]
[im 21/62  brain]
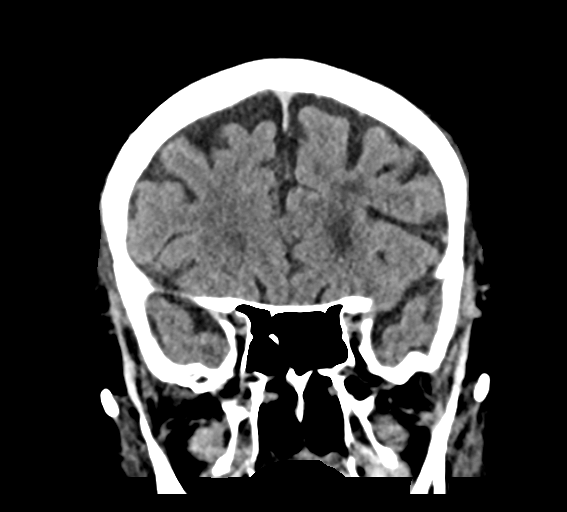
[im 28/62  brain]
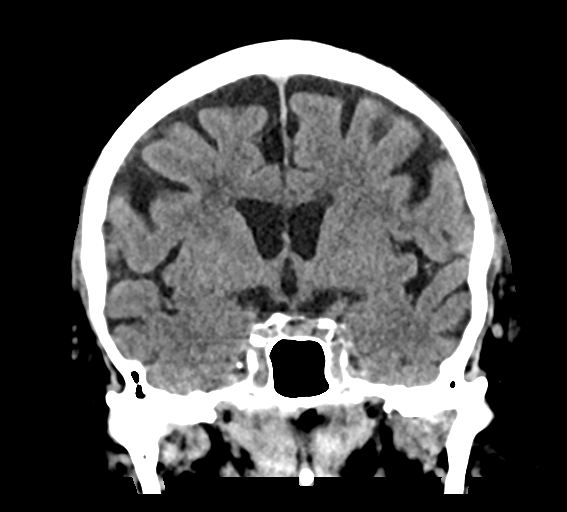
[im 34/62  brain]
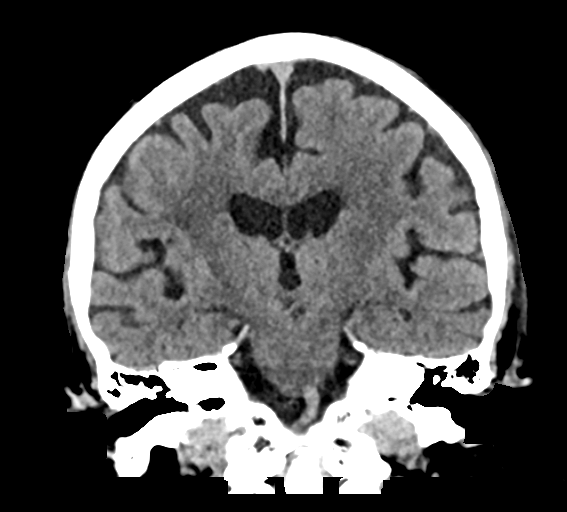

[Series 6: sag soft · sagittal · 0.35mm/px · 3 of 55 slices shown]
[im 19/55  brain]
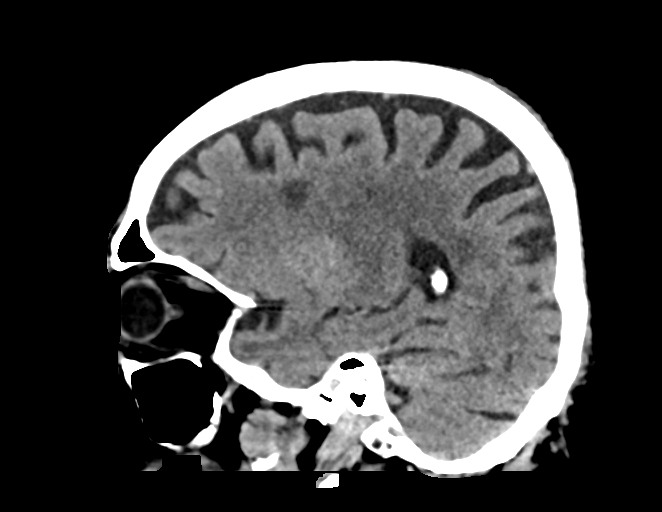
[im 28/55  brain]
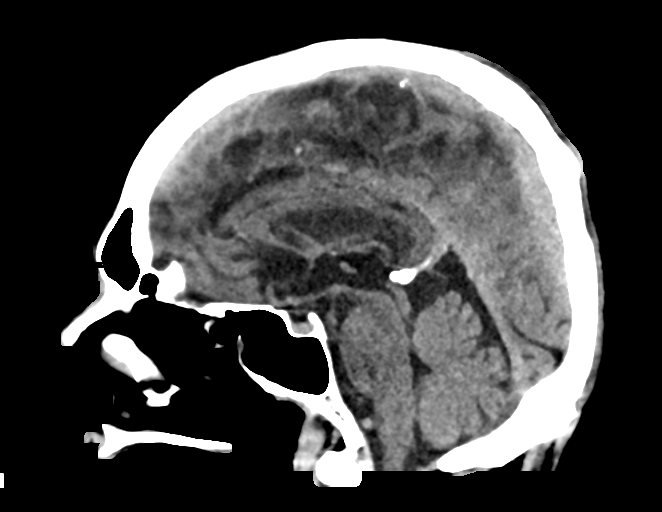
[im 37/55  brain]
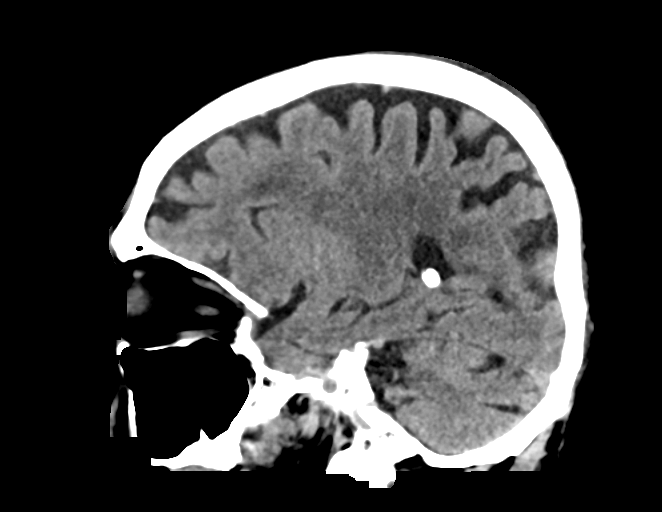

[16 of 47 positions shown; findings below may reference images not displayed]

FINDINGS: Brain: No evidence of acute infarction, hemorrhage, hydrocephalus,
extra-axial collection or mass lesion/mass effect.

Ventricular sulcal enlargement is noted consistent with mild
atrophy. There are patchy areas of white matter hypoattenuation
consistent with mild chronic microvascular ischemic change. A small
old lacune infarct is noted in the pons centrally. These findings
are stable.

Vascular: No hyperdense vessel or unexpected calcification.

Skull: Normal. Negative for fracture or focal lesion.

Sinuses/Orbits: Globes and orbits are unremarkable. Sinuses and
mastoid air cells are clear.

Other: None.
IMPRESSION: 1. No acute intracranial abnormalities.
2. Mild atrophy and chronic microvascular ischemic change. Stable
appearance from the recent prior exam.

## 2019-08-03 IMAGING — CR DG WRIST COMPLETE 3+V*R*
4 series · 4 of 4 positions shown · non-contrast
Comparison: Radiographs January 15, 2017.

CLINICAL DATA: Right wrist deformity after fall today.

EXAM:
RIGHT WRIST - COMPLETE 3+ VIEW

[wrist pa]
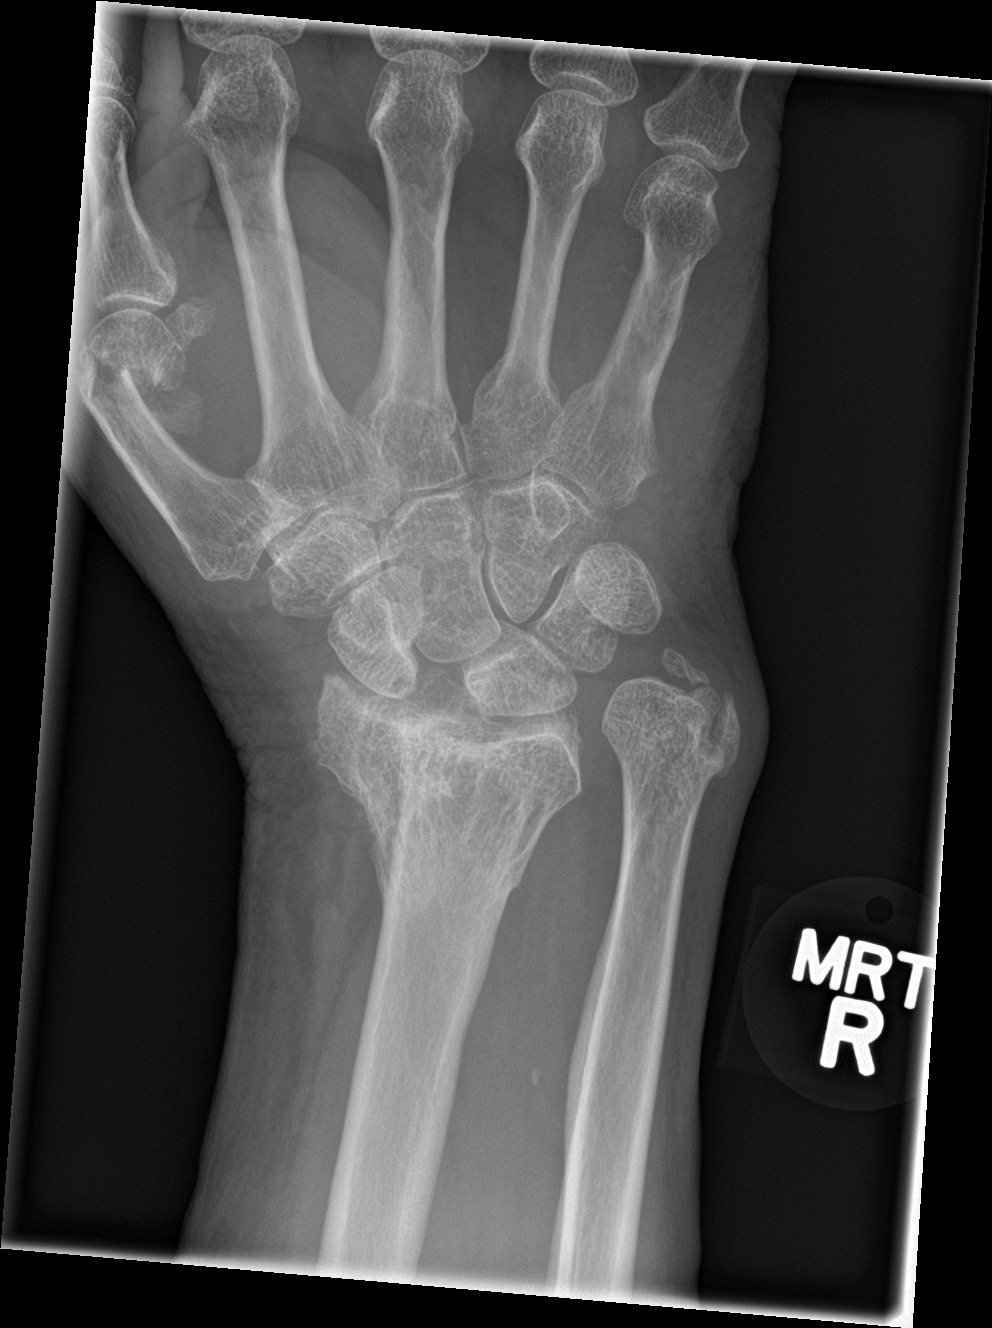

[wrist obl]
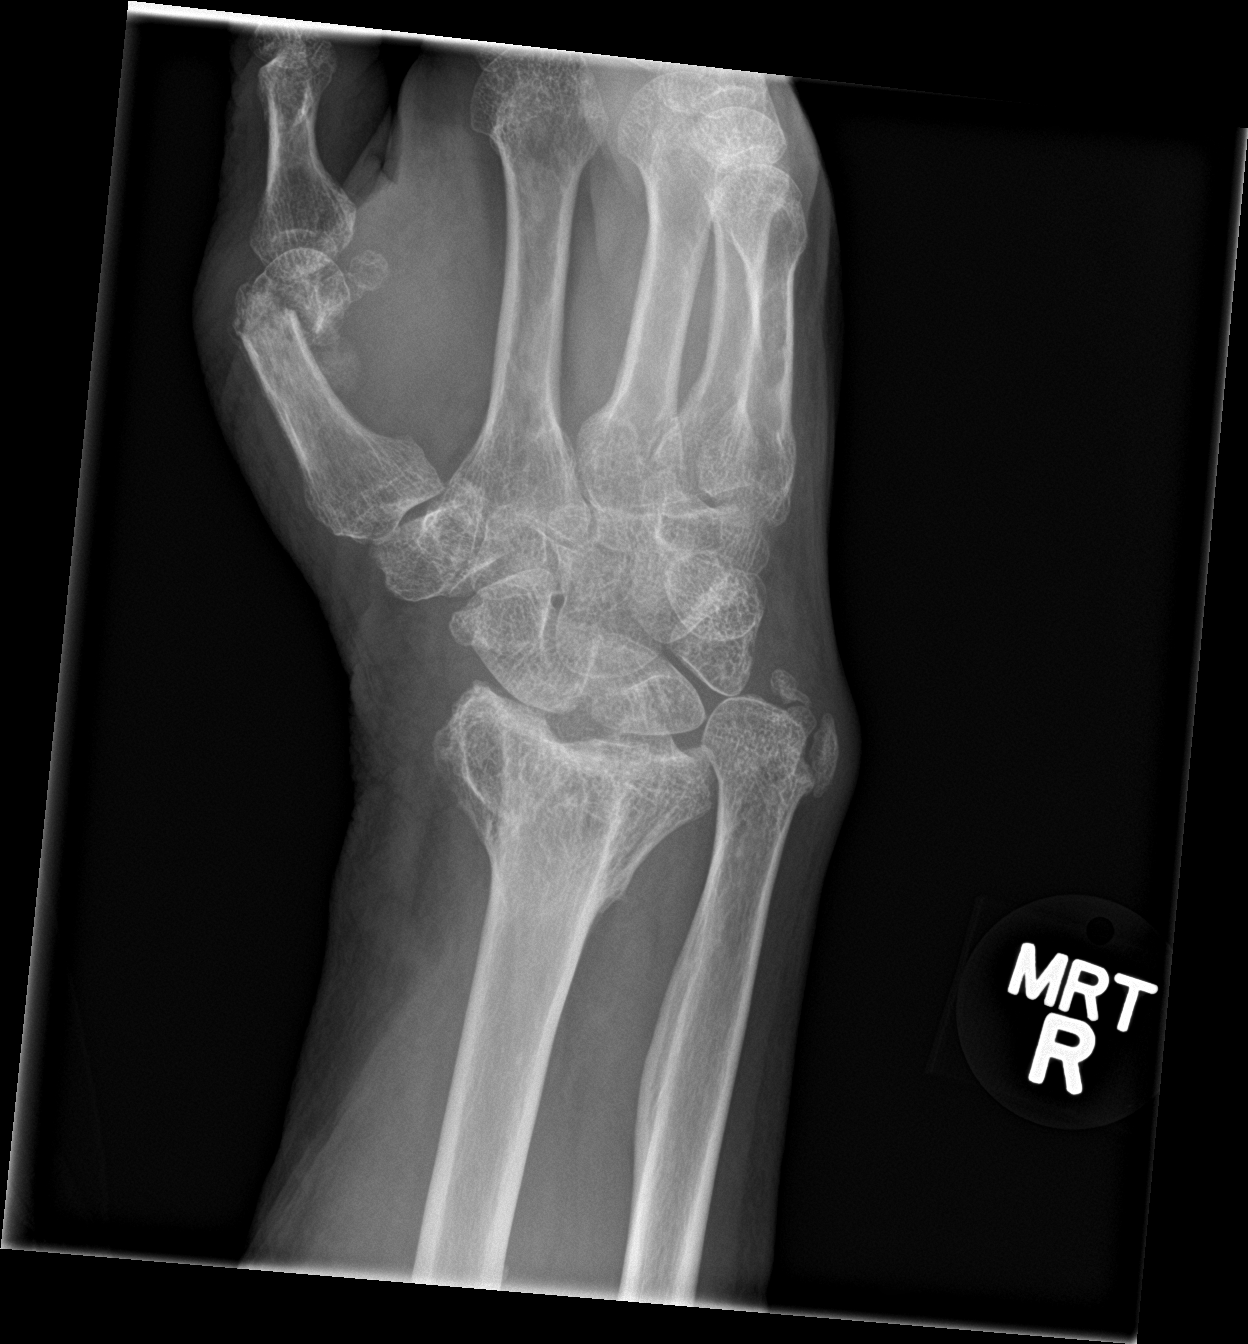

[wrist lat]
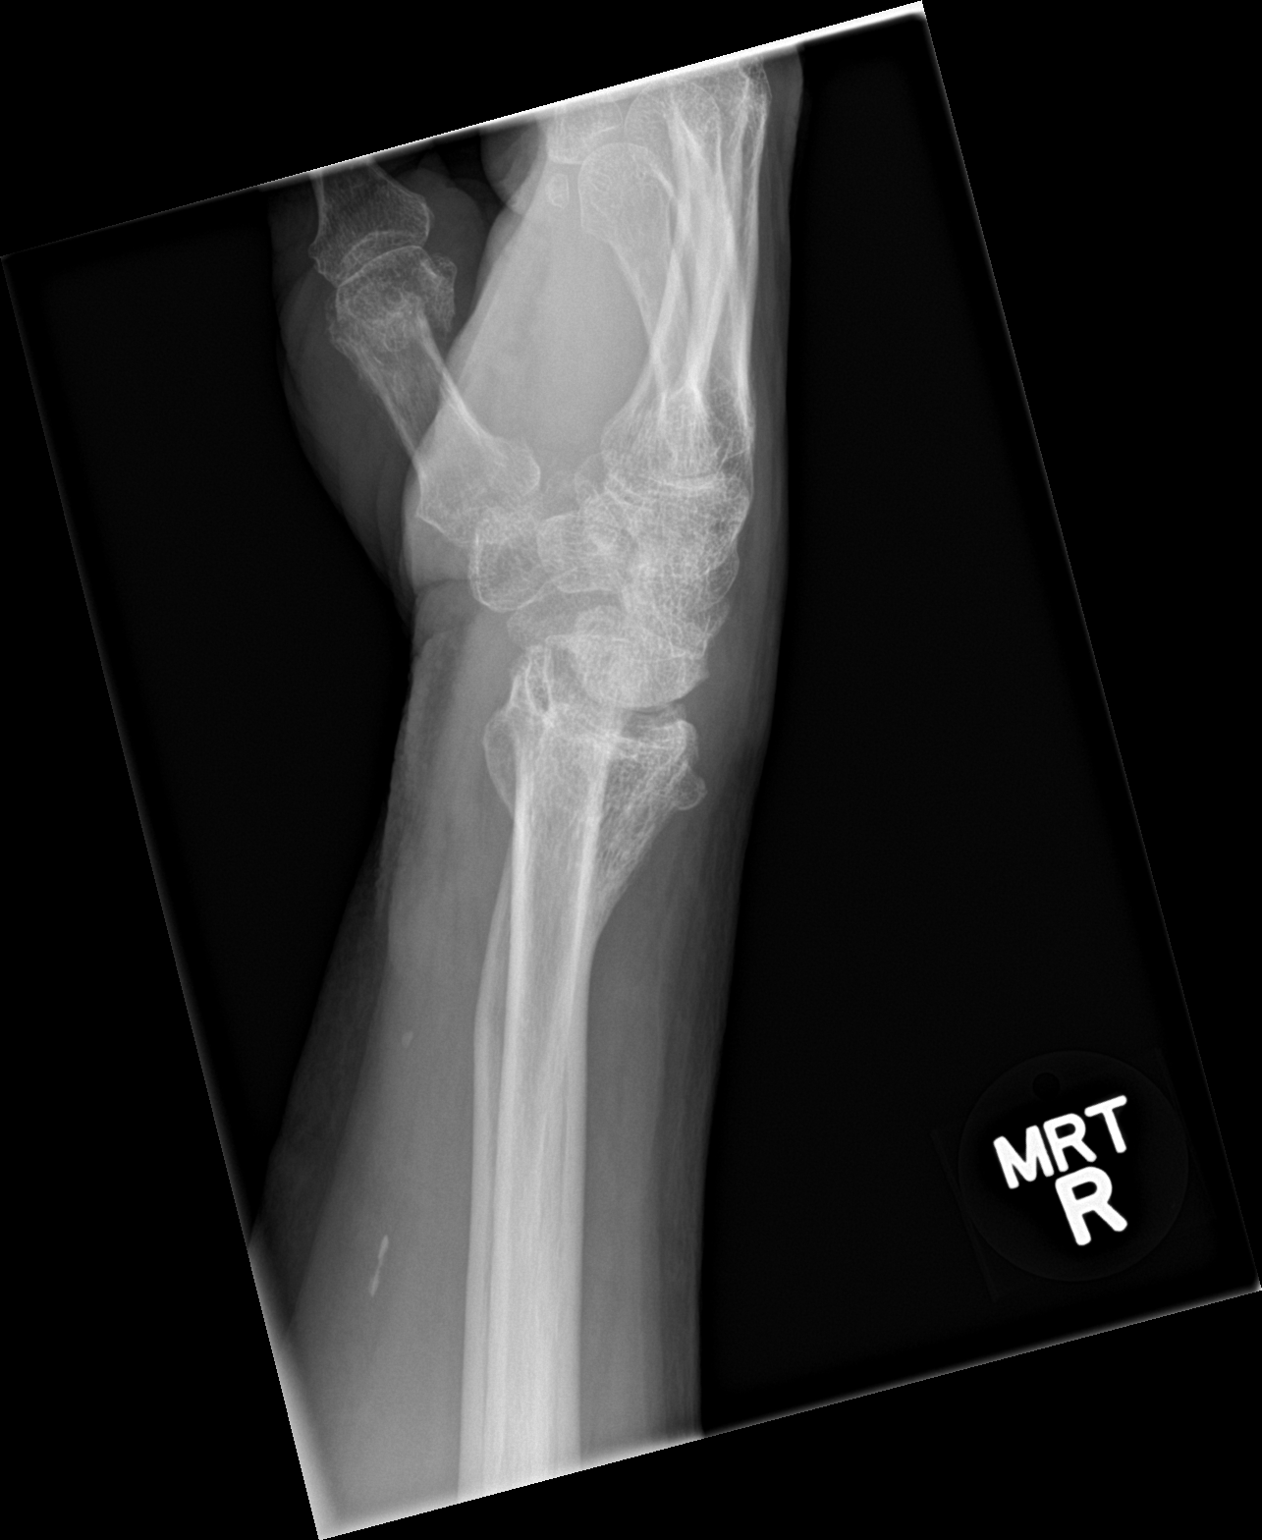

[wrist navicular]
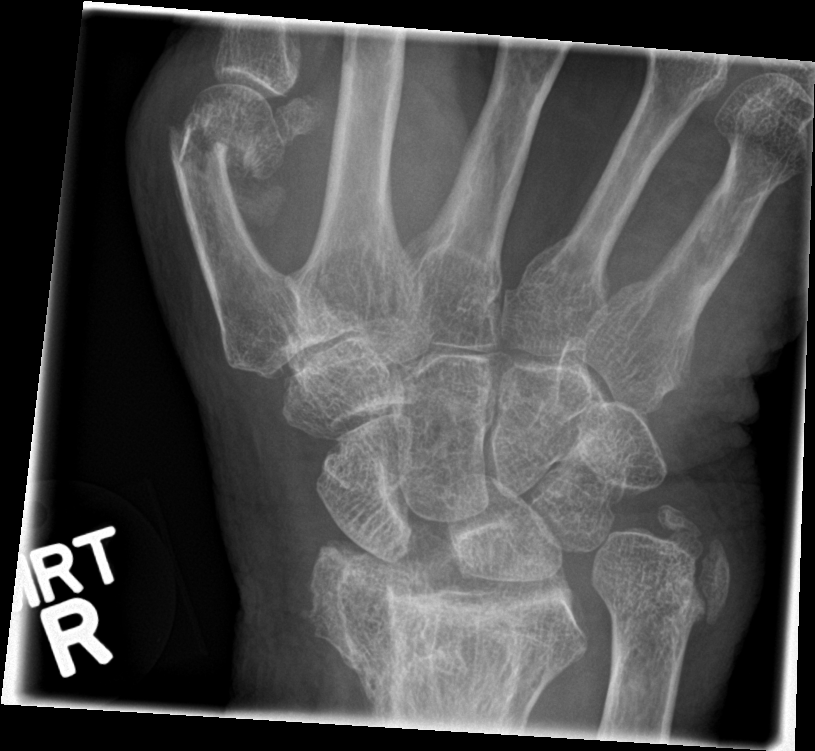

[4 of 4 positions shown; findings below may reference images not displayed]

FINDINGS: Stable widening of scapholunate space is noted. Continued presence
of distal first metacarpal fracture is noted. Stable deformity of
distal radius and ulna is noted consistent with old healed
fractures. No new abnormality is noted.
IMPRESSION: Stable distal first metacarpal fracture as well as stable deformity
of distal right radius and ulna consistent with old healed
fractures. No new abnormality is noted.

## 2019-08-03 IMAGING — CR DG LUMBAR SPINE COMPLETE 4+V
5 series · 5 of 5 positions shown · non-contrast
Comparison: Radiographs December 17, 2016.

CLINICAL DATA: Altered mental status after fall today.

EXAM:
LUMBAR SPINE - COMPLETE 4+ VIEW

[l-spine ap]
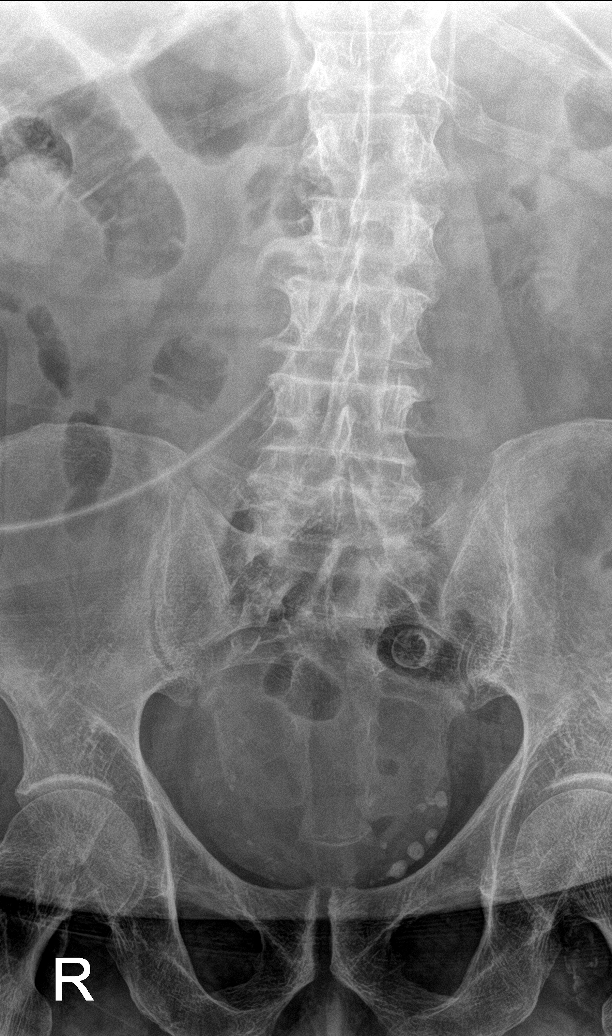

[l-spine obl (1 of 2)]
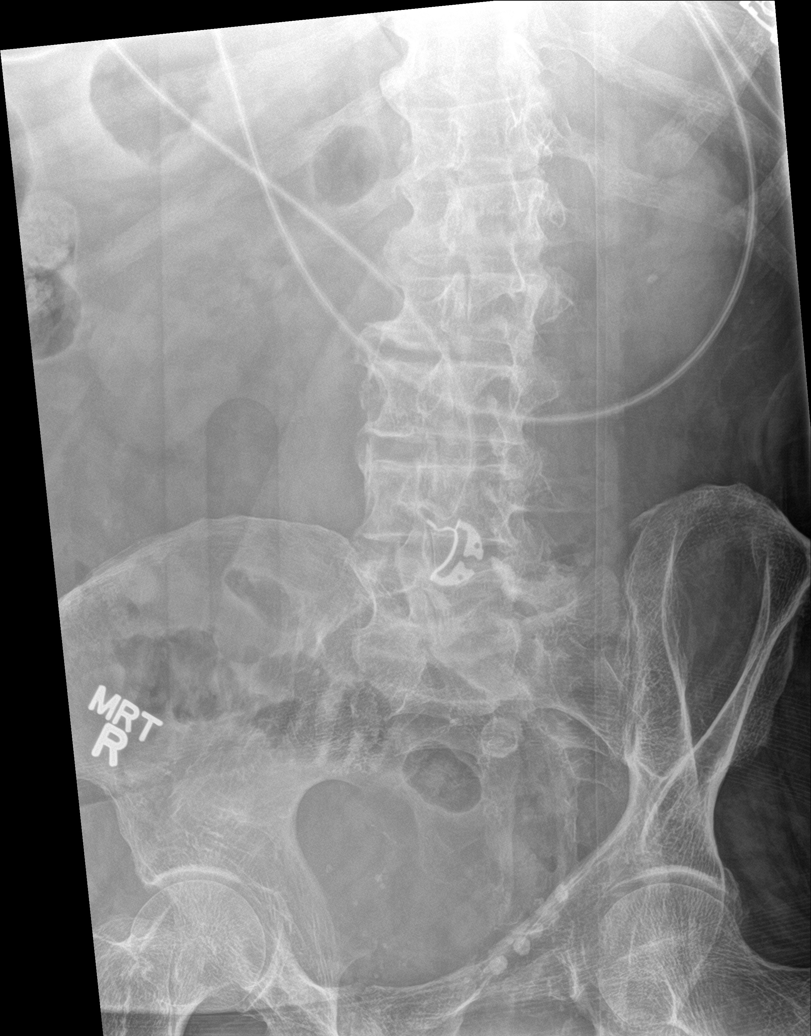

[l-spine obl (2 of 2)]
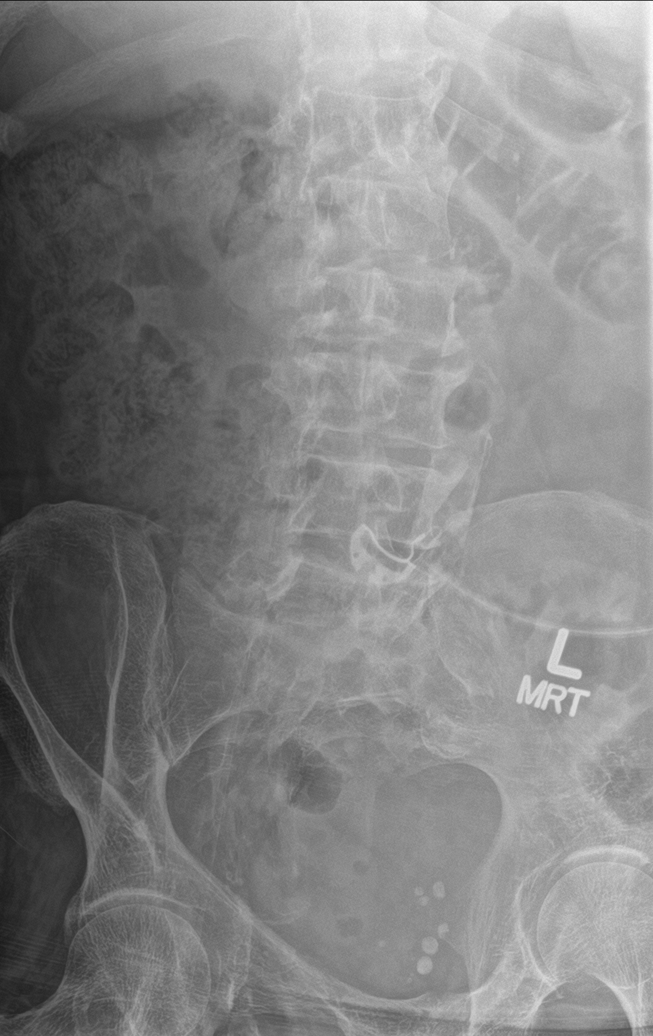

[l-spine lat]
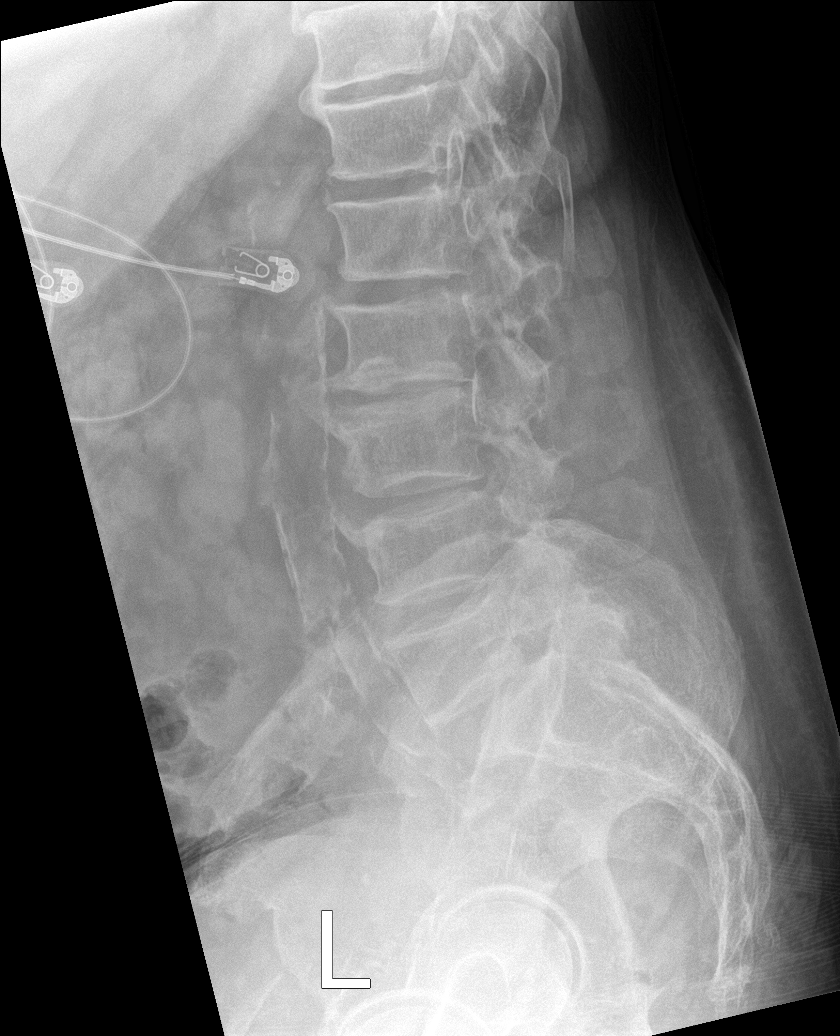

[l-spine spot]
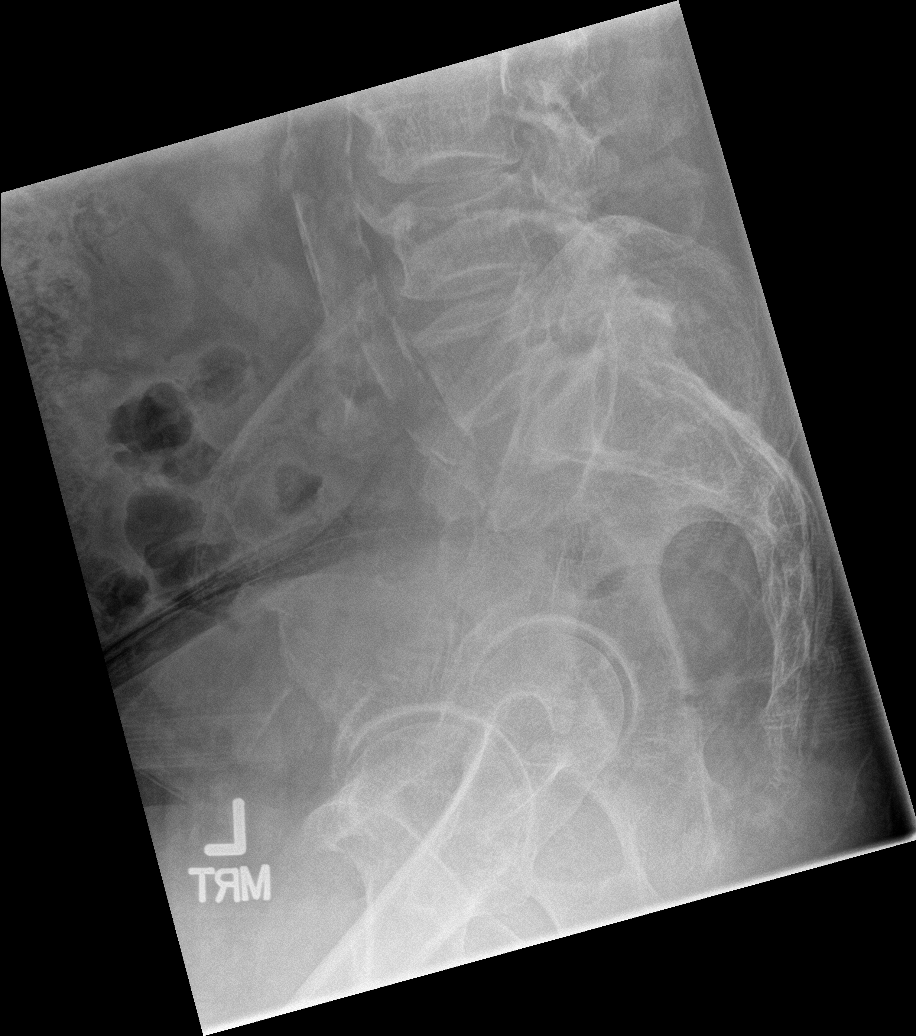

[5 of 5 positions shown; findings below may reference images not displayed]

FINDINGS: No fracture or spondylolisthesis is noted. Moderate degenerative
disc disease is noted at L2-3 with anterior osteophyte formation.
Mild degenerative disc disease is noted at L1 to and L3-4.
Atherosclerosis of thoracic aorta is noted.
IMPRESSION: Multilevel degenerative disc disease. No acute abnormality seen the
lumbar spine.

## 2019-08-06 IMAGING — DX DG CHEST 1V PORT
1 series · 1 of 1 positions shown · non-contrast
Comparison: 02/05/2017.

CLINICAL DATA: COPD.  Fall with weakness.  Atrial fibrillation.

EXAM:
PORTABLE CHEST 1 VIEW

[chest ap]
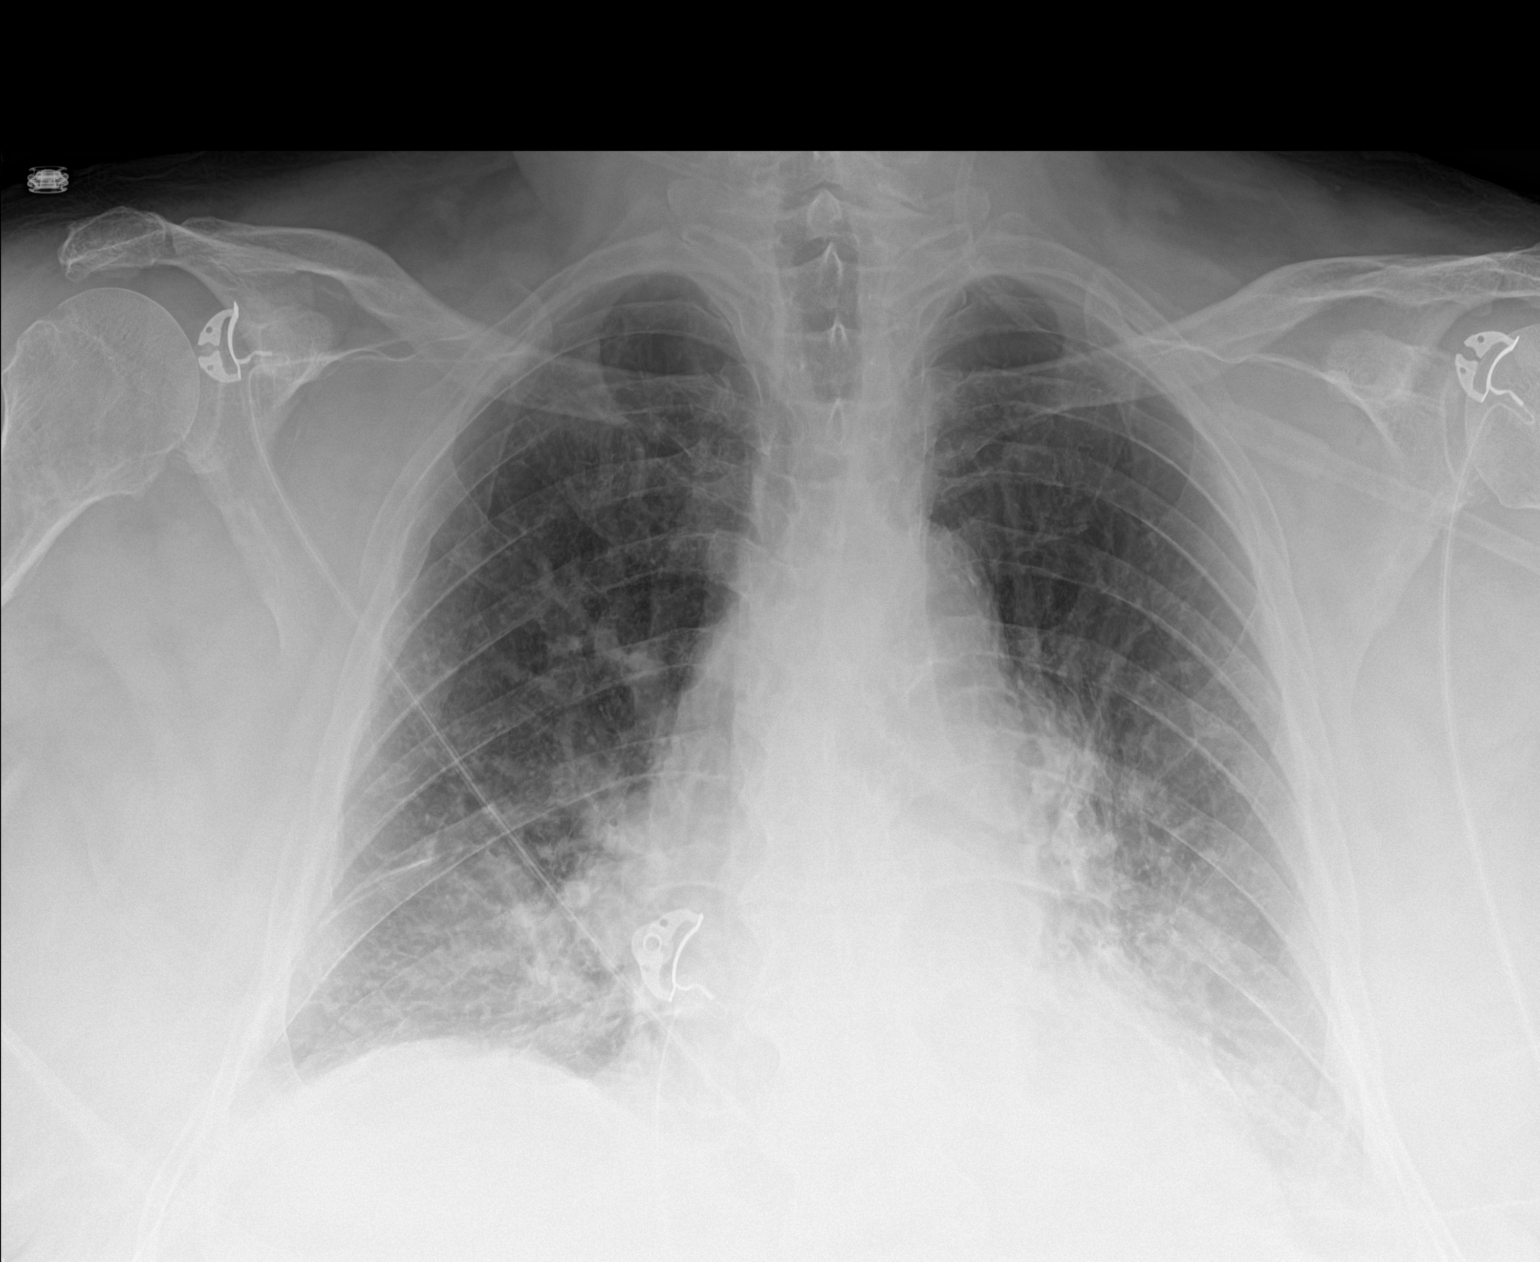

[1 of 1 positions shown; findings below may reference images not displayed]

FINDINGS: The heart is enlarged. Subsegmental atelectatic change the bases
without definite consolidation or frank edema. Small BILATERAL
effusions are likely. There is no pneumothorax.
IMPRESSION: Similar appearance to priors. Cardiomegaly. Bibasilar subsegmental
atelectasis.

## 2019-08-07 IMAGING — CT CT ANGIO CHEST
2 of 6 series · 18 of 36 positions shown · IV contrast (Omni 300)
Comparison: Chest radiograph performed 02/08/2017

CLINICAL DATA: Acute onset of generalized chest pain and shortness
of breath.

EXAM:
CT ANGIOGRAPHY CHEST WITH CONTRAST
TECHNIQUE: Multidetector CT imaging of the chest was performed using the
standard protocol during bolus administration of intravenous
contrast. Multiplanar CT image reconstructions and MIPs were
obtained to evaluate the vascular anatomy.
CONTRAST:  100mL FFSUWG-1OQ IOPAMIDOL (FFSUWG-1OQ) INJECTION 76%

[Series 7: pe thins · axial · 0.74mm/px · z∈[+1359,+1599]mm · 17 of 272 slices shown]
[im 16/272  lung]
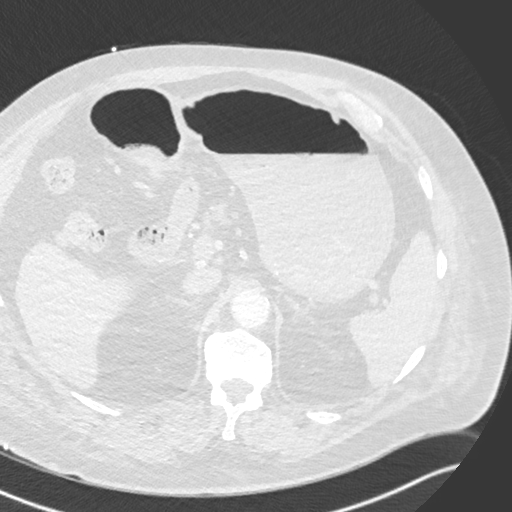
[im 31/272  mediastinal]
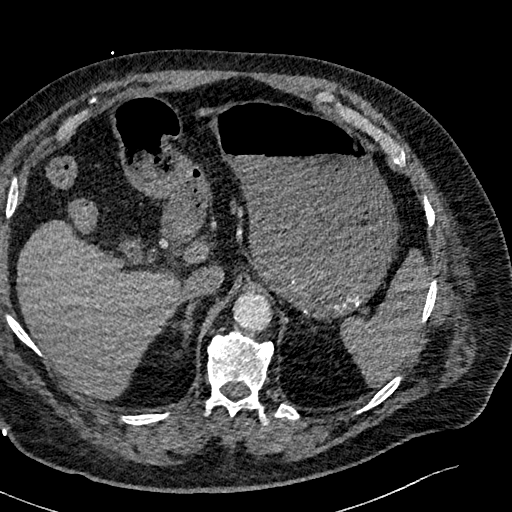
[im 46/272  lung]
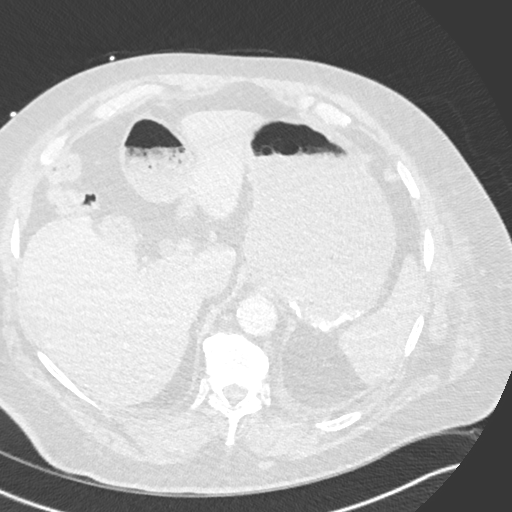
[im 61/272  mediastinal]
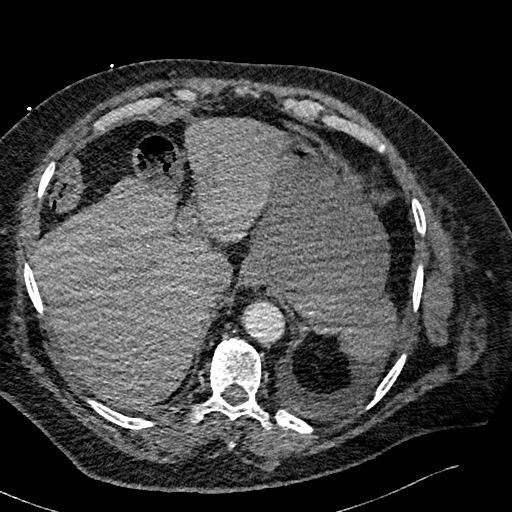
[im 76/272  lung]
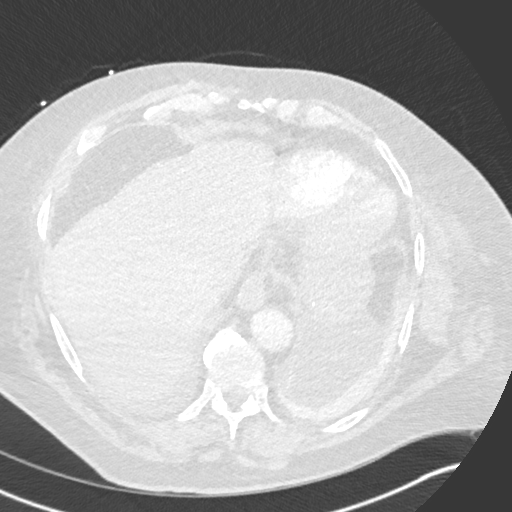
[im 91/272  mediastinal]
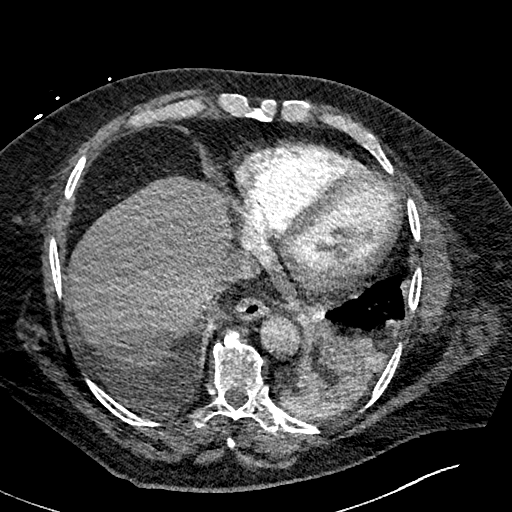
[im 106/272  lung]
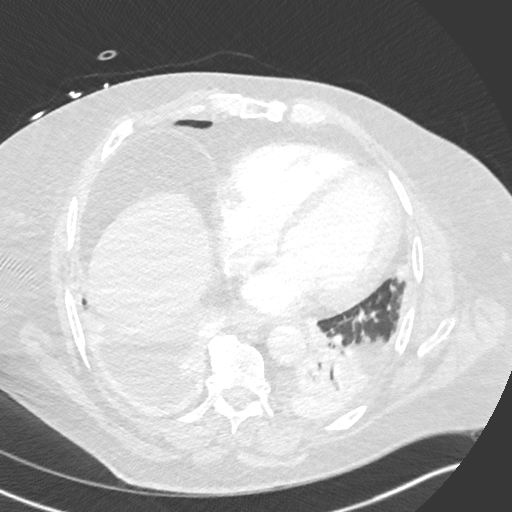
[im 121/272  mediastinal]
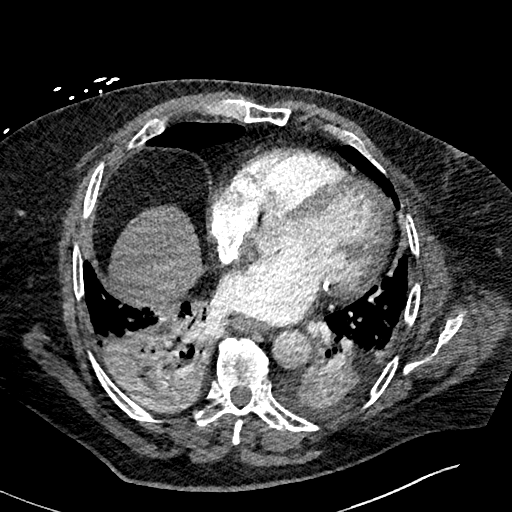
[im 136/272  lung]
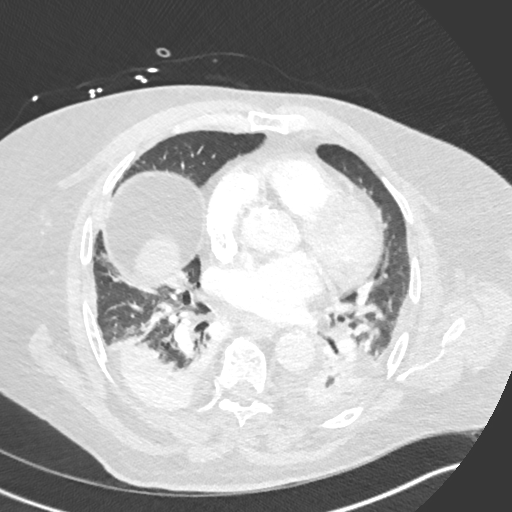
[im 151/272  mediastinal]
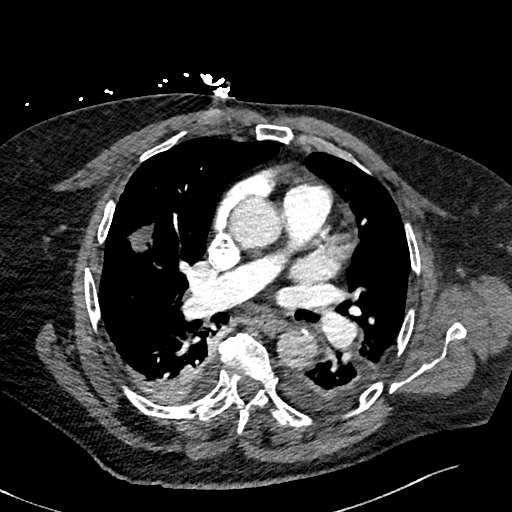
[im 166/272  lung]
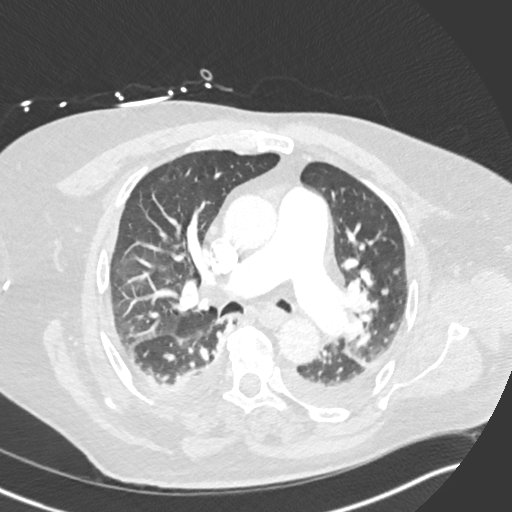
[im 181/272  mediastinal]
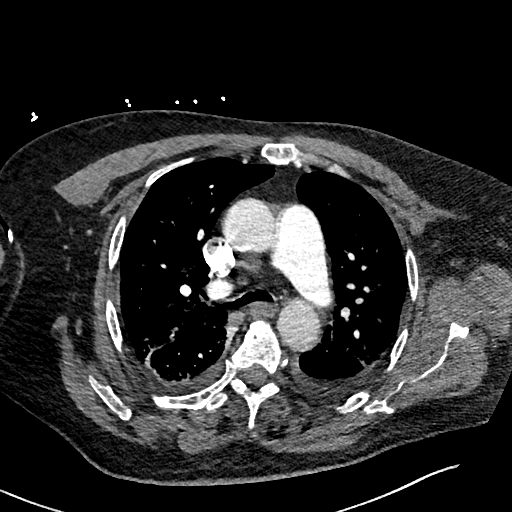
[im 196/272  lung]
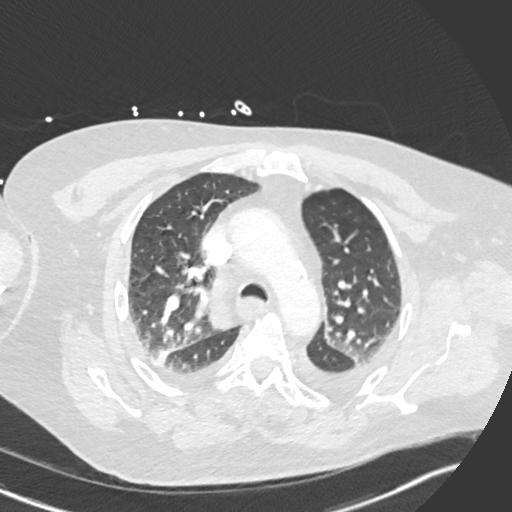
[im 211/272  mediastinal]
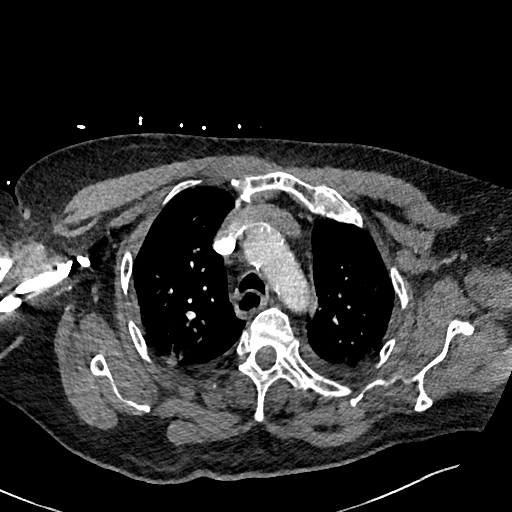
[im 226/272  lung]
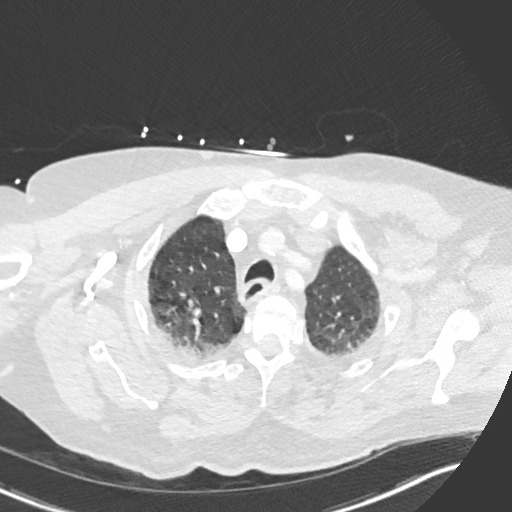
[im 241/272  mediastinal]
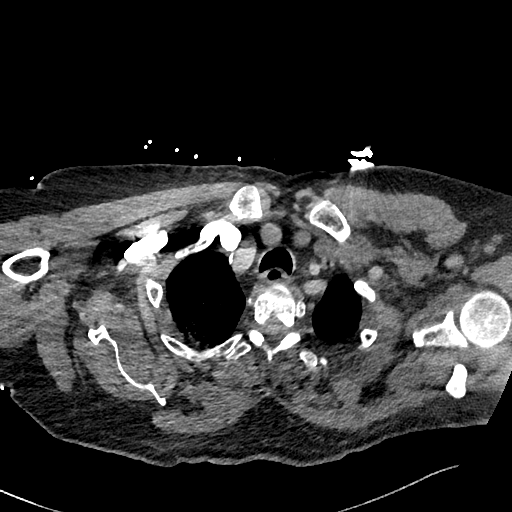
[im 256/272  lung]
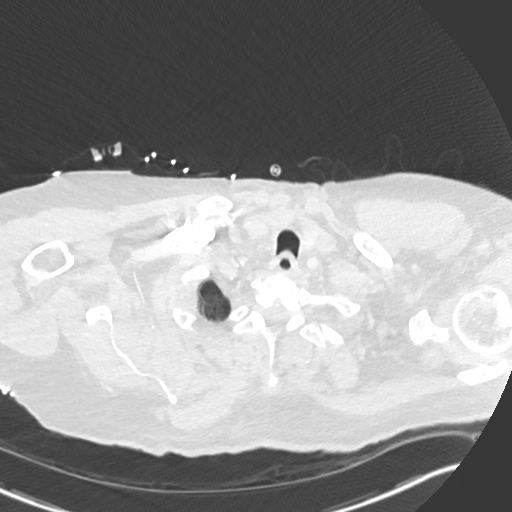

[Series 8: pe 2mm cor · coronal · 0.61mm/px · 1 of 156 slices shown]
[im 78/156  mediastinal]
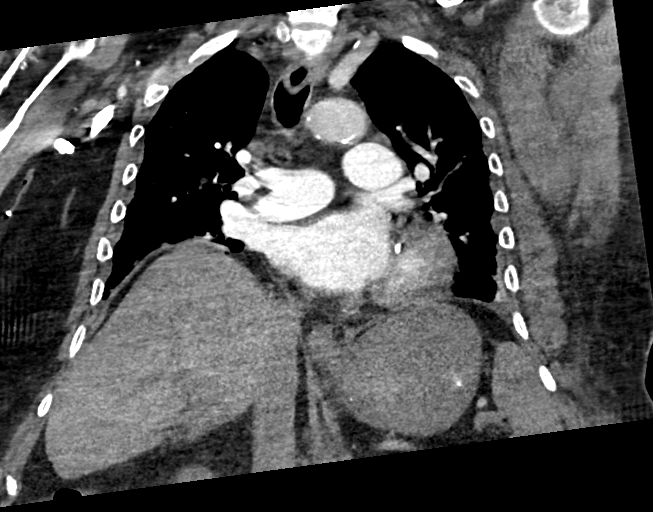

[18 of 36 positions shown; findings below may reference images not displayed]

FINDINGS: Cardiovascular: There is no evidence of significant pulmonary
embolus.

The heart is normal in size. Scattered coronary artery
calcifications are seen. Mild calcification is noted along the
thoracic aorta and proximal great vessels.

Mediastinum/Nodes: No mediastinal lymphadenopathy is seen. No
pericardial effusion is identified. The visualized portions of the
thyroid gland are unremarkable. No axillary lymphadenopathy is
appreciated.

Mild tracheobronchomalacia is noted.

Lungs/Pleura: Small bilateral pleural effusions are noted. There is
partial consolidation of both lower lung lobes, which may reflect
atelectasis or pneumonia. Mild interstitial prominence is noted. No
pneumothorax is seen.

Upper Abdomen: The visualized portions of the liver and spleen are
unremarkable. Minimal soft tissue inflammation about the gallbladder
is nonspecific. The visualized portions of the adrenal glands are
within normal limits.

Musculoskeletal: No acute osseous abnormalities are identified. The
visualized musculature is unremarkable in appearance.

Review of the MIP images confirms the above findings.
IMPRESSION: 1. No evidence of significant pulmonary embolus.
2. Small bilateral pleural effusions. Partial consolidation of both
lower lung lobes, which may reflect atelectasis or pneumonia. Mild
interstitial prominence noted.
3. Mild tracheobronchomalacia.
4. Scattered coronary artery calcifications seen.
5. Minimal nonspecific soft tissue inflammation about the
gallbladder.

## 2019-08-18 IMAGING — DX DG CHEST 1V PORT
1 series · 1 of 1 positions shown · non-contrast
Comparison: CTA chest dated 02/09/2017

CLINICAL DATA: Altered mental status, fall, hypotension

EXAM:
PORTABLE CHEST 1 VIEW

[chest ap]
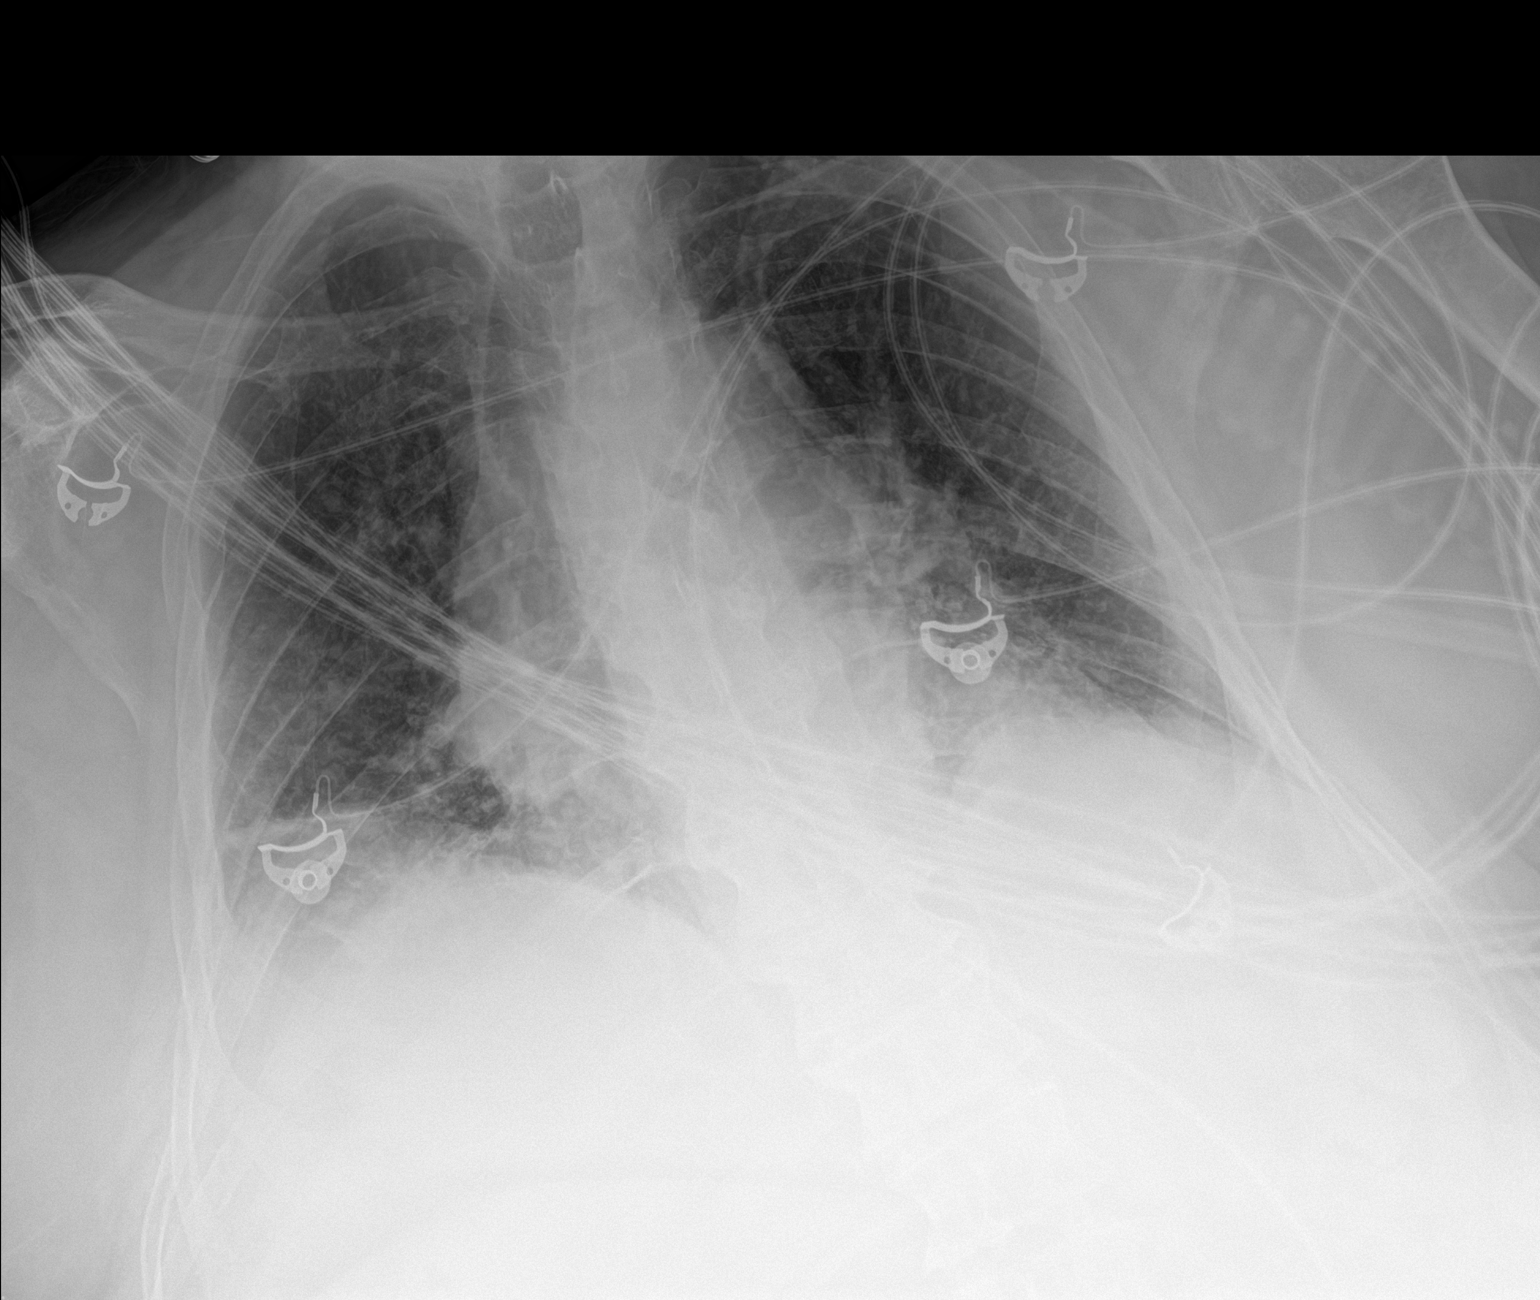

[1 of 1 positions shown; findings below may reference images not displayed]

FINDINGS: Mild bilateral lower lobe opacities, atelectasis versus pneumonia.
Small right pleural effusion.

Cardiomegaly with pulmonary vascular congestion. No frank
interstitial edema.

No pneumothorax.
IMPRESSION: Mild bilateral lower lobe opacities, atelectasis versus pneumonia.
Small right pleural effusion.

Cardiomegaly pulmonary vascular congestion. No frank interstitial
edema.

## 2019-08-29 IMAGING — CR DG CHEST 2V
4 series · 4 of 4 positions shown · non-contrast
Comparison: 02/20/2017

CLINICAL DATA: Generalize weakness

EXAM:
CHEST  2 VIEW

[w chest lat (1 of 2)]
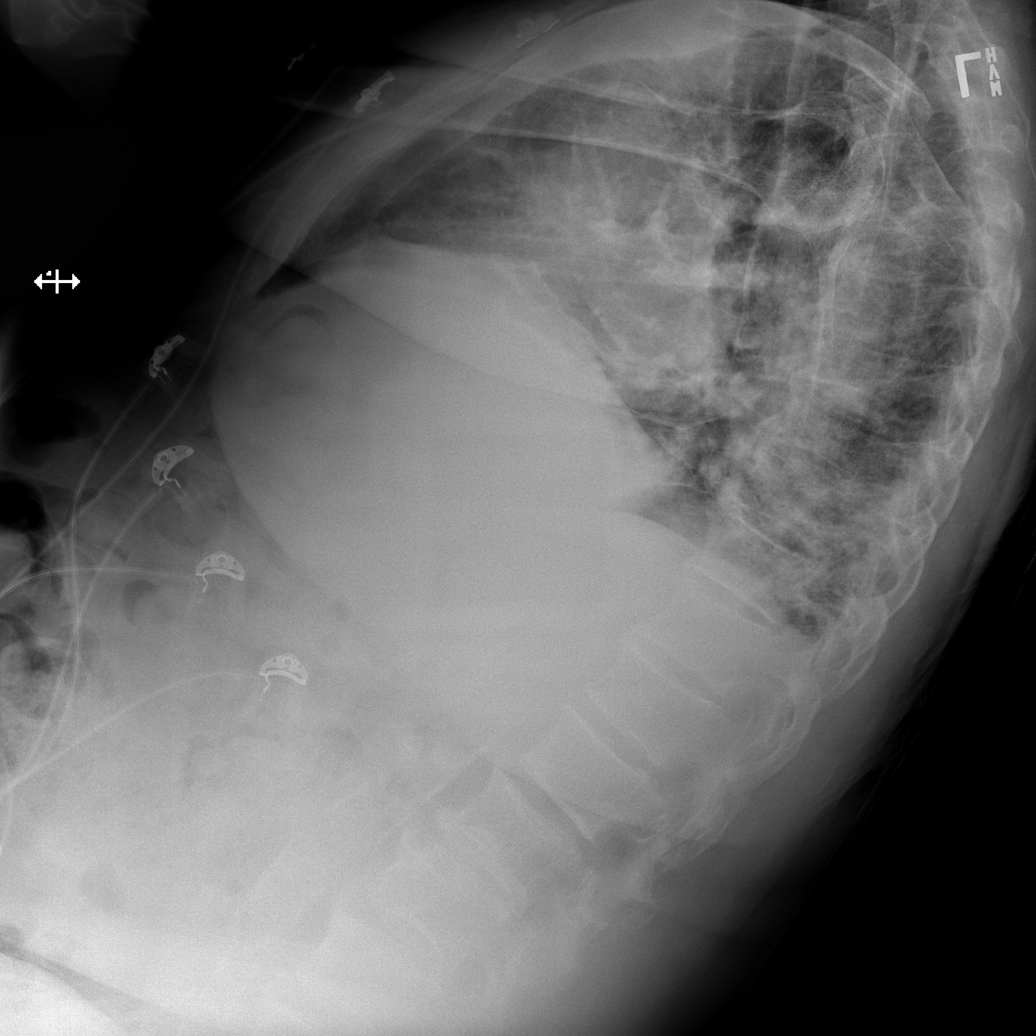

[w chest lat (2 of 2)]
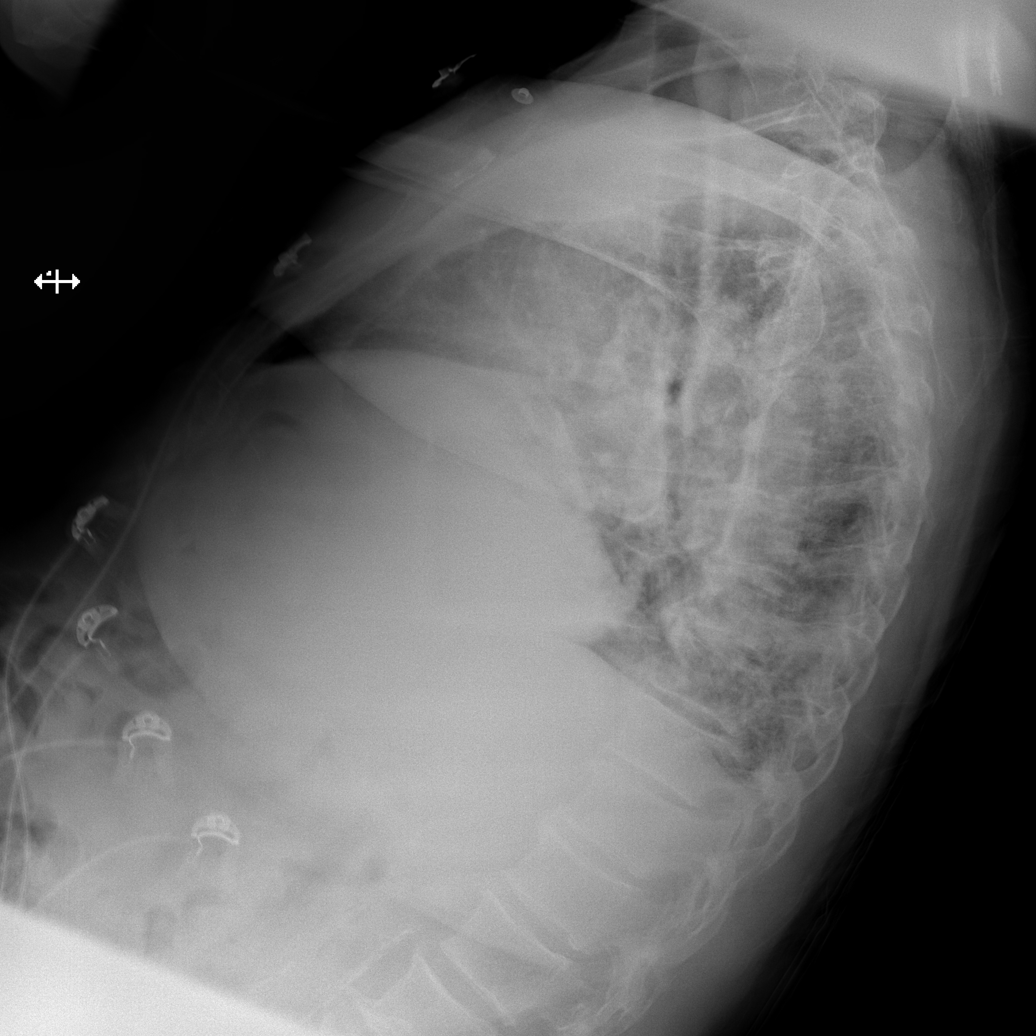

[x chest ap (1 of 2)]
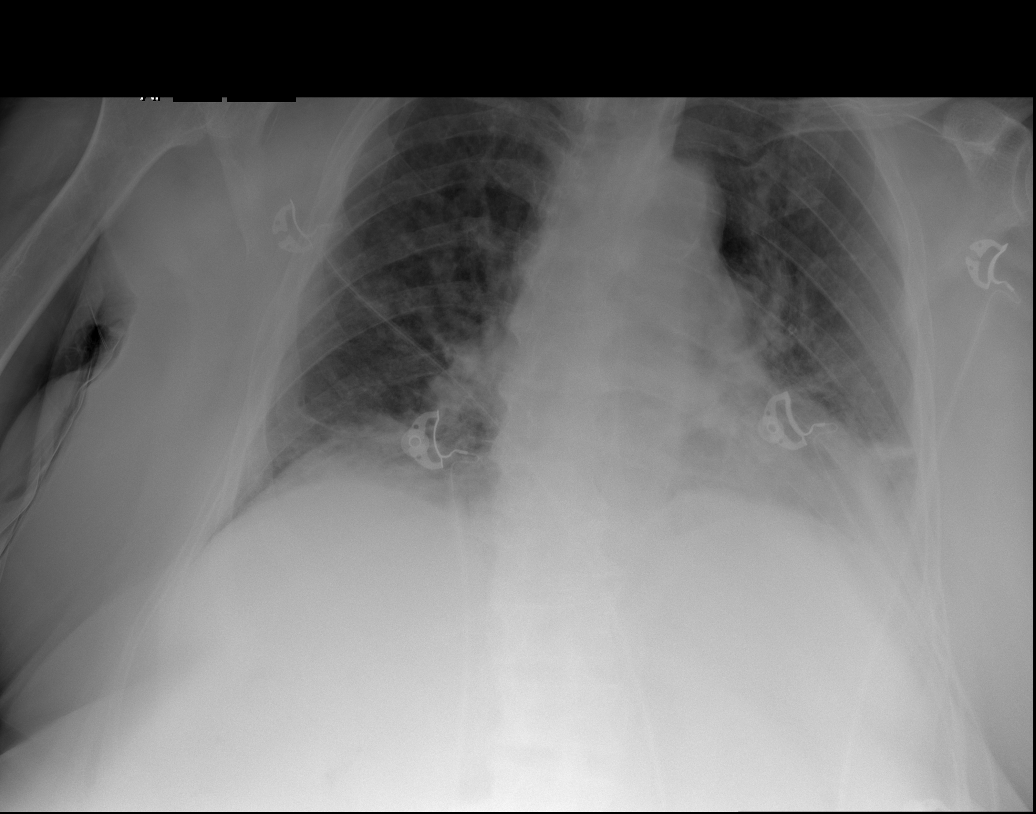

[x chest ap (2 of 2)]
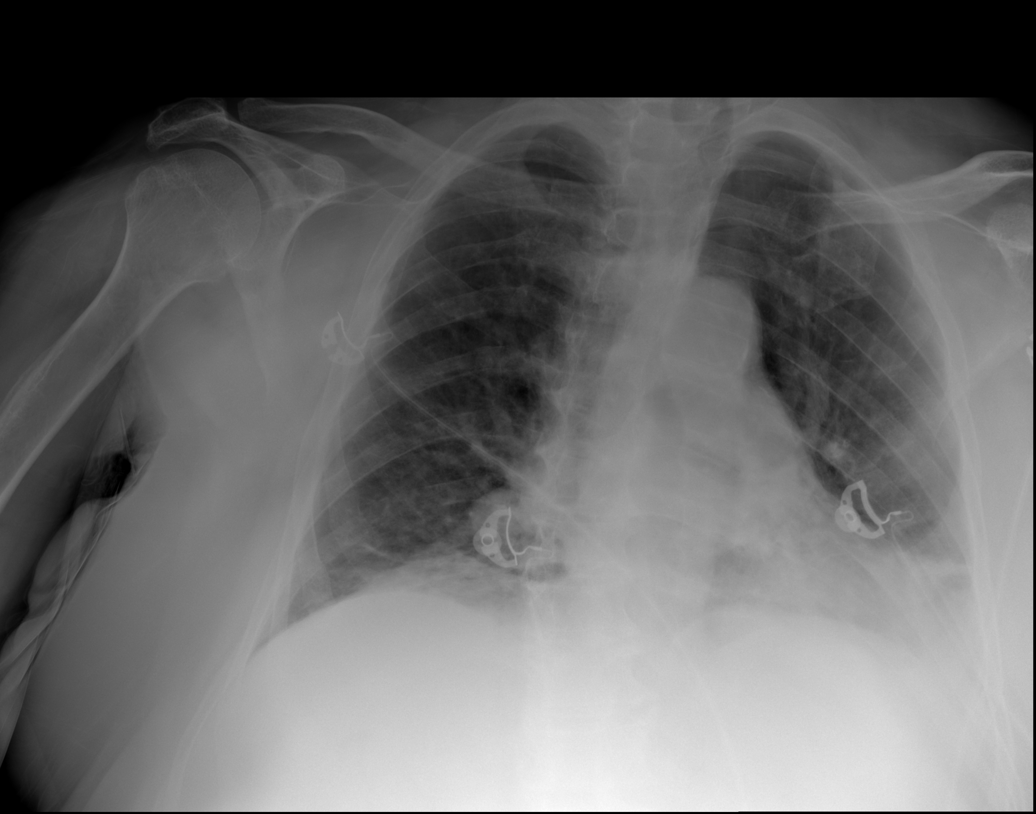

[4 of 4 positions shown; findings below may reference images not displayed]

FINDINGS: Cardiomegaly with vascular congestion. Streaky bibasilar atelectasis
or infiltrates. Aortic atherosclerosis. No pneumothorax.
Degenerative changes of the spine.
IMPRESSION: 1. Cardiomegaly with vascular congestion
2. Streaky bibasilar atelectasis versus mild infiltrates.

## 2019-08-29 IMAGING — CT CT HEAD W/O CM
3 series · 16 of 47 positions shown, 19 images · non-contrast
Comparison: CT brain 02/20/2017, 02/05/2017

CLINICAL DATA: Altered level of consciousness

EXAM:
CT HEAD WITHOUT CONTRAST
TECHNIQUE: Contiguous axial images were obtained from the base of the skull
through the vertex without intravenous contrast.

[Series 2: head wo · axial · 0.42mm/px · z∈[+1242,+1367]mm · 10 of 31 slices shown, 13 images]
[im 3/31  brain]
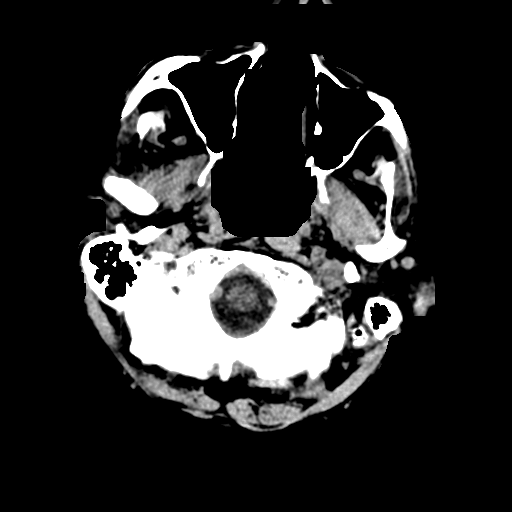
[im 3/31  bone]
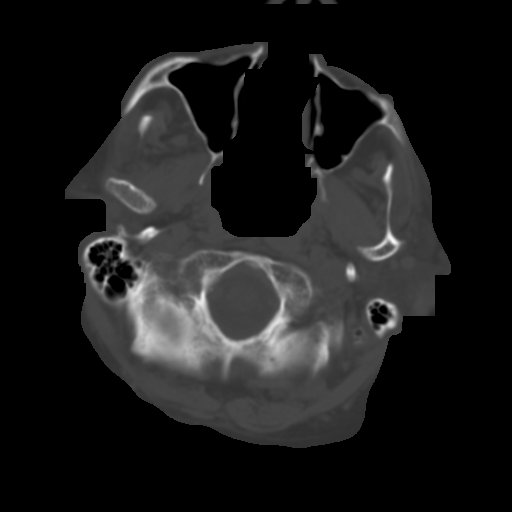
[im 6/31  brain]
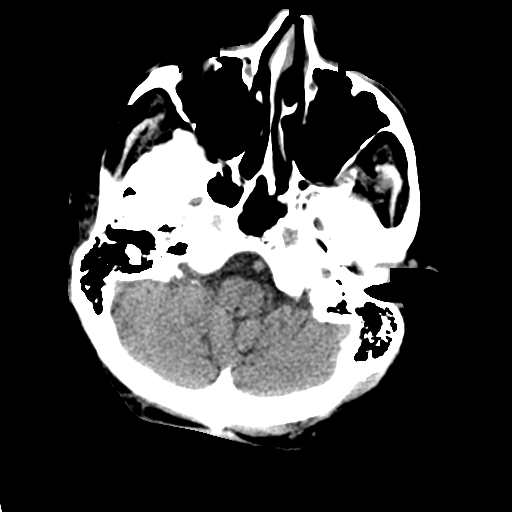
[im 9/31  brain]
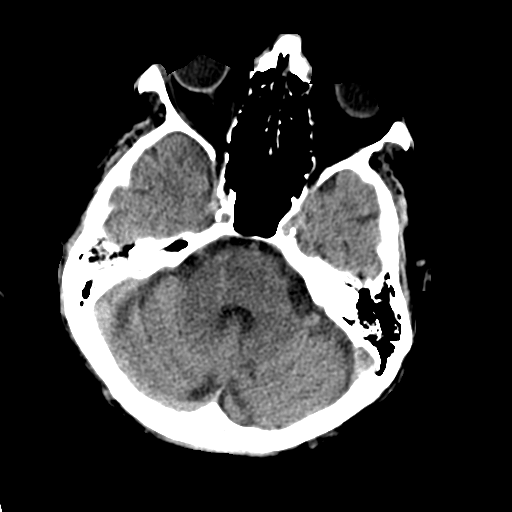
[im 11/31  brain]
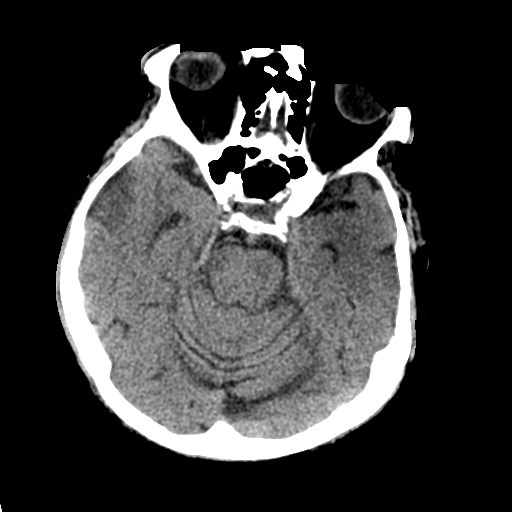
[im 14/31  brain]
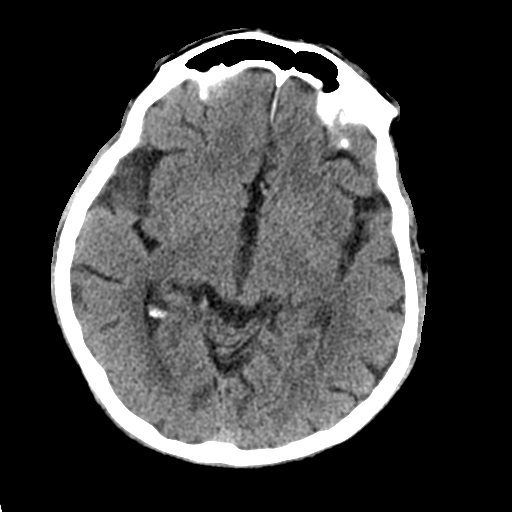
[im 14/31  bone]
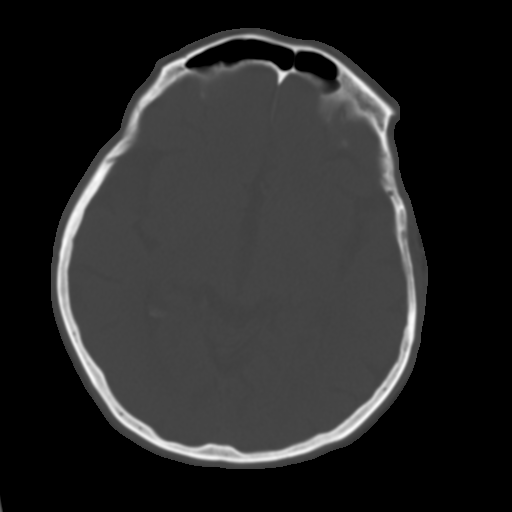
[im 17/31  brain]
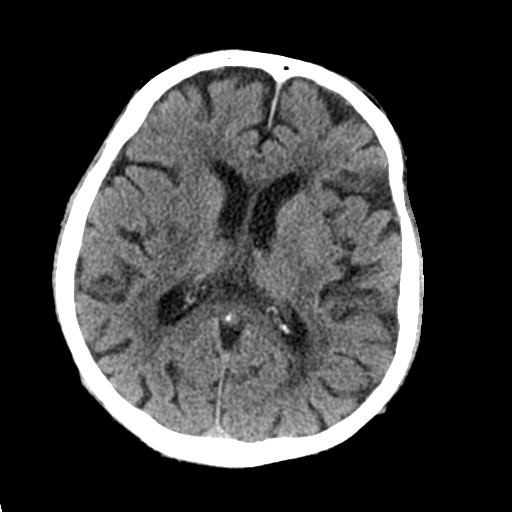
[im 20/31  brain]
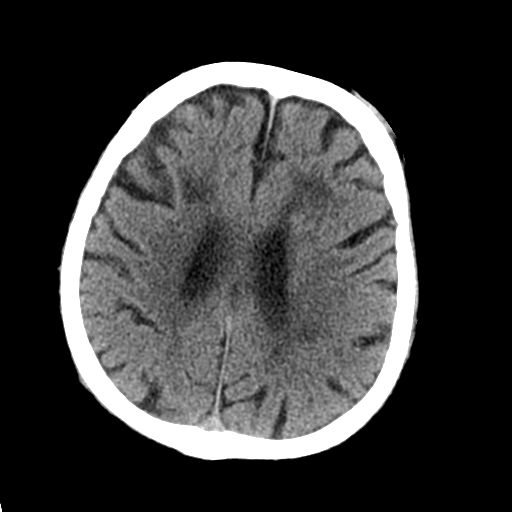
[im 23/31  brain]
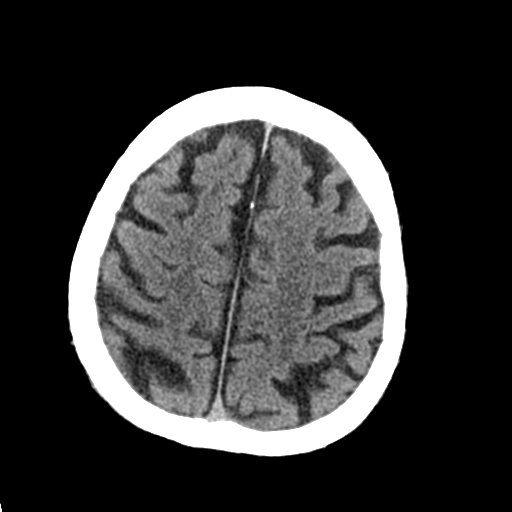
[im 25/31  brain]
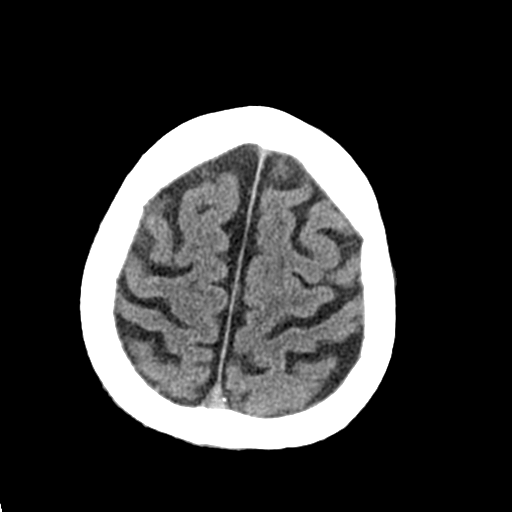
[im 25/31  bone]
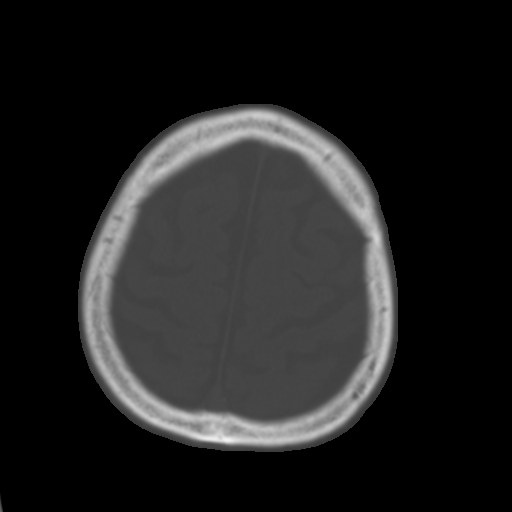
[im 28/31  brain]
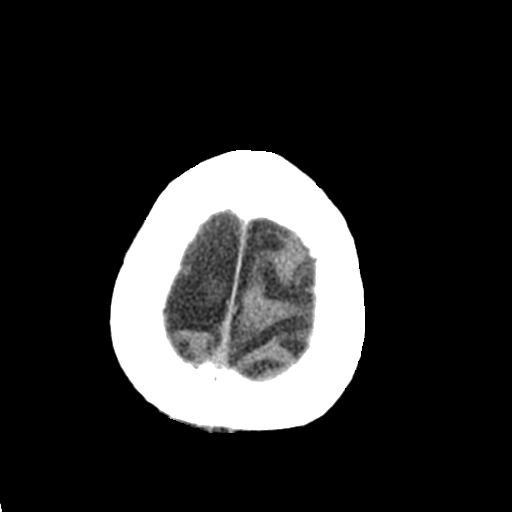

[Series 4: coronal soft tissue · coronal · 0.32mm/px · 3 of 66 slices shown]
[im 22/66  brain]
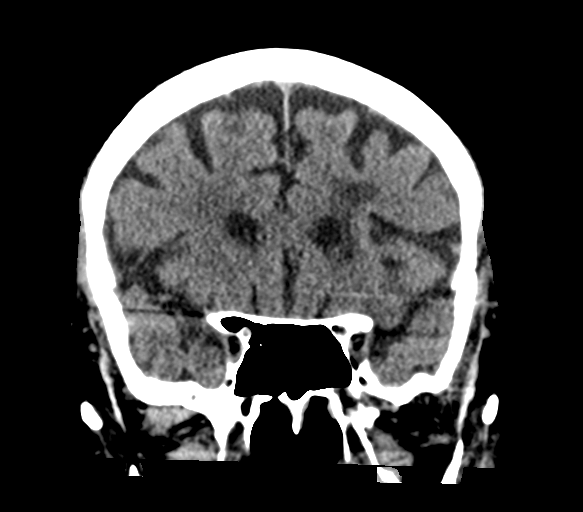
[im 29/66  brain]
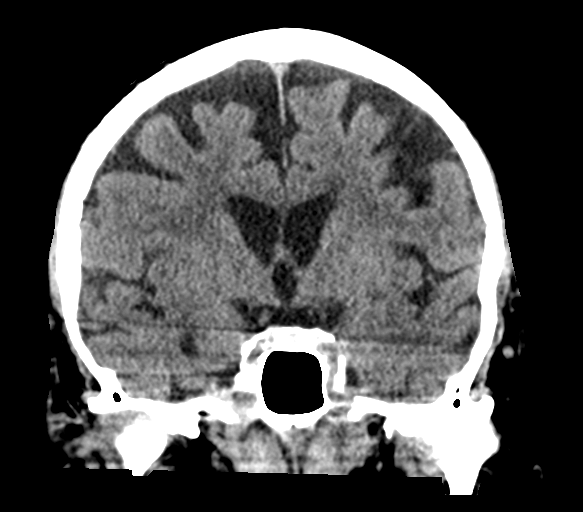
[im 37/66  brain]
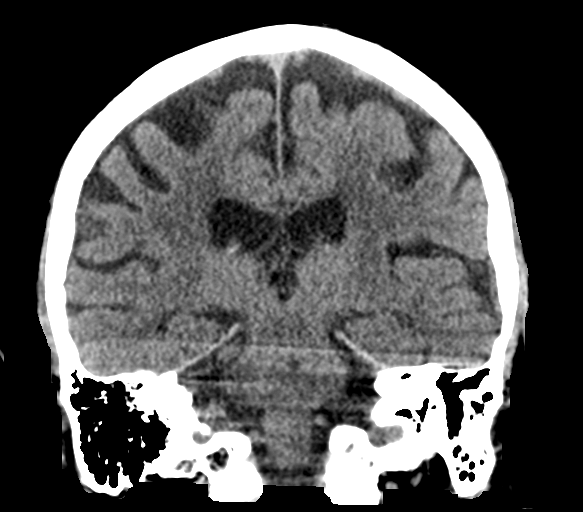

[Series 5: sagittal soft tissue · sagittal · 0.35mm/px · 3 of 61 slices shown]
[im 21/61  brain]
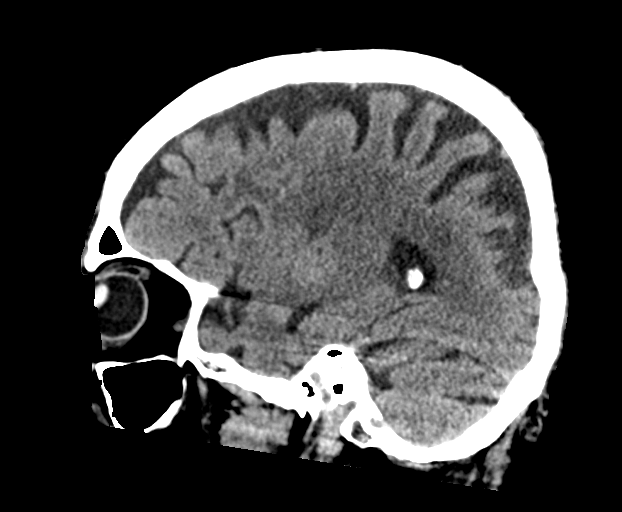
[im 31/61  brain]
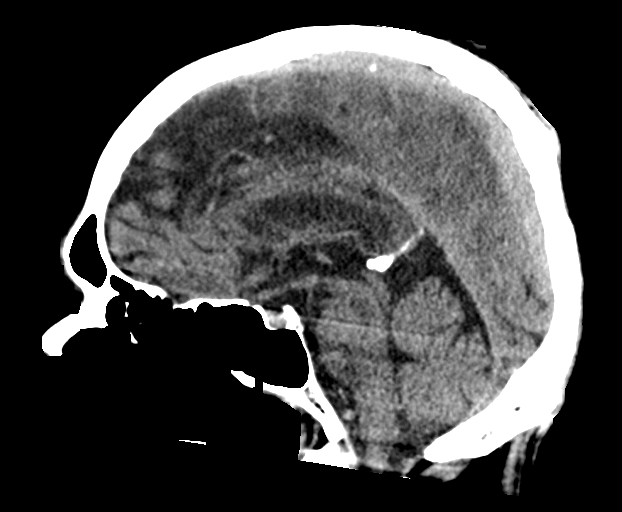
[im 41/61  brain]
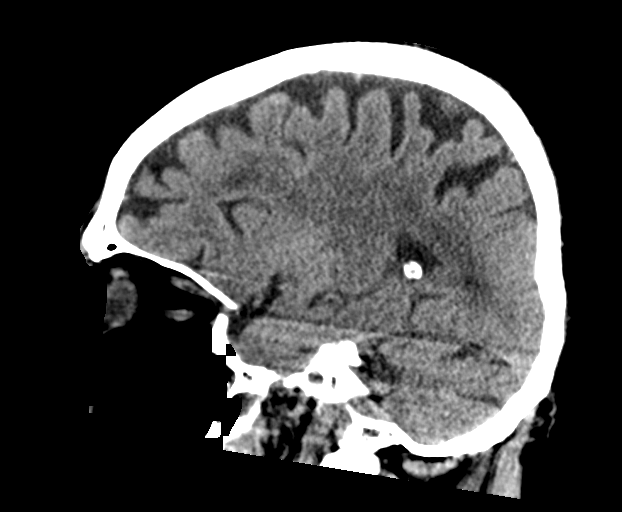

[16 of 47 positions shown; findings below may reference images not displayed]

FINDINGS: Brain: No acute territorial infarction, hemorrhage or intracranial
mass is visualized. Old lacunar infarct in the central pons.
Moderate vessel ischemic changes of the white matter. Mild to
moderate atrophy. Stable ventricle size

Vascular: No hyperdense vessels.  Carotid artery calcification

Skull: Normal. Negative for fracture or focal lesion.

Sinuses/Orbits: Mild mucosal thickening in the ethmoid sinuses. No
acute orbital abnormality.

Other: None.
IMPRESSION: 1. No CT evidence for acute intracranial abnormality.
2. Atrophy and small vessel ischemic changes of the white matter.

## 2019-08-30 IMAGING — DX DG CHEST 2V
2 series · 2 of 2 positions shown · non-contrast
Comparison: 03/03/2017.

CLINICAL DATA: Shortness of breath.

EXAM:
CHEST  2 VIEW

[chest lat]
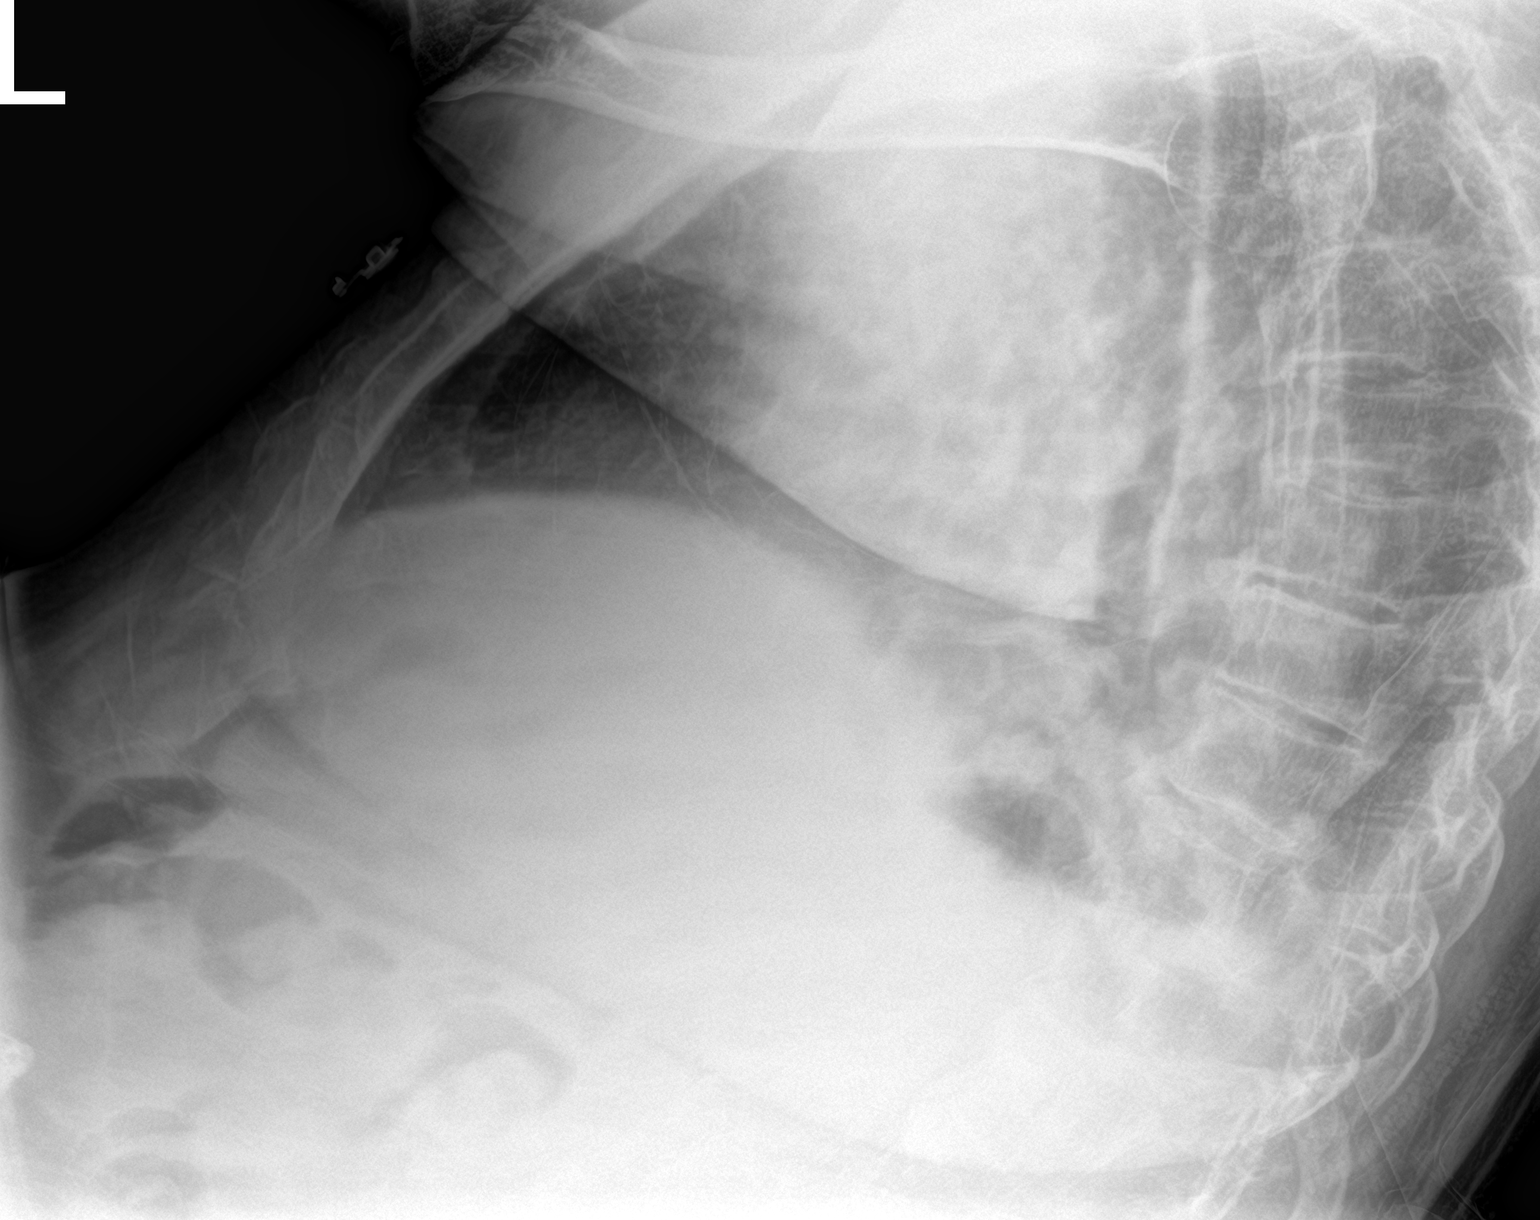

[chest ap]
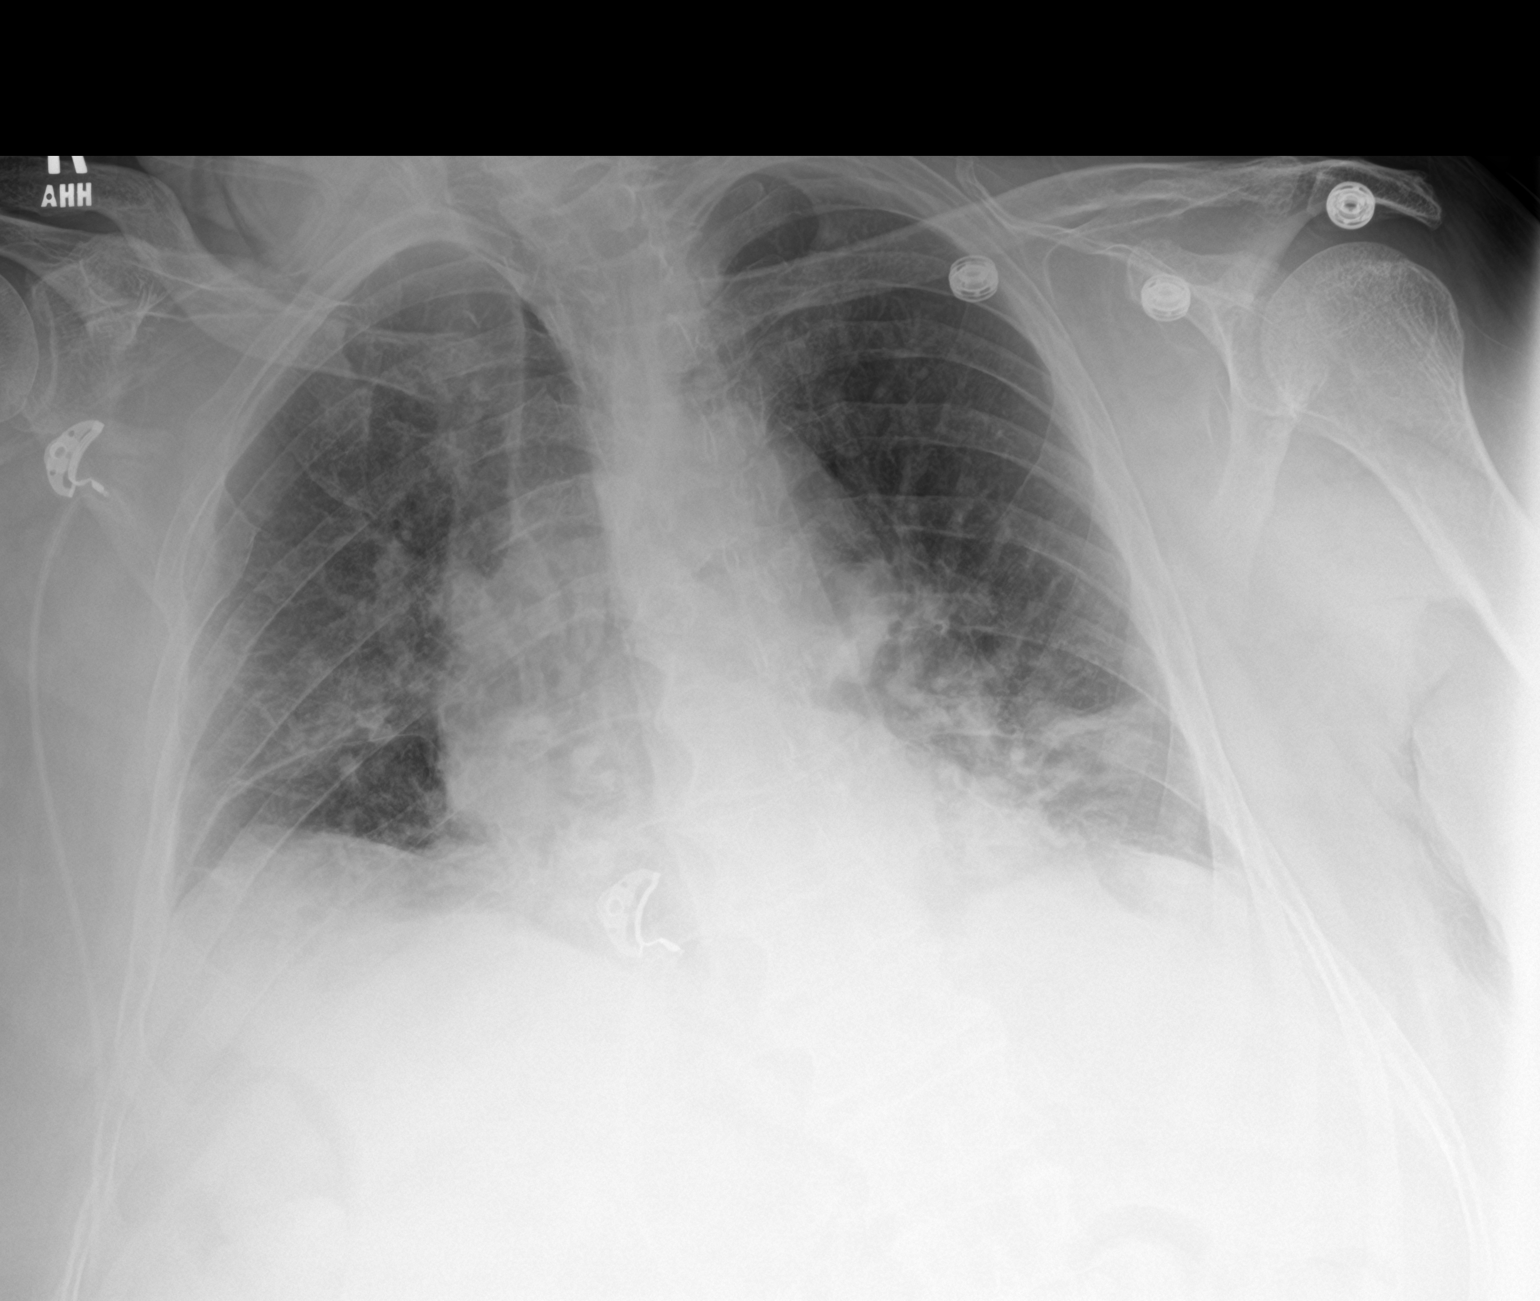

[2 of 2 positions shown; findings below may reference images not displayed]

FINDINGS: Cardiomegaly with normal pulmonary vascularity. No pulmonary venous
congestion noted on today's exam. Low lung volumes with mild
bibasilar atelectasis/infiltrates, progressed from prior exam. No
pleural effusion or pneumothorax. Degenerative changes thoracic
spine.
IMPRESSION: Low lung volumes with bibasilar atelectasis/infiltrates. Findings
have progressed from prior exam.

2. Stable cardiomegaly. No pulmonary venous congestion noted on
today's exam.

## 2019-08-31 IMAGING — CT CT ANGIO CHEST
2 of 6 series · 18 of 46 positions shown · IV contrast (APPLIED)
Comparison: Chest CTA 02/09/2017.

CLINICAL DATA: Hypoxia. Left lower extremity swelling. Evaluate for
pulmonary embolism.

EXAM:
CT ANGIOGRAPHY CHEST WITH CONTRAST
TECHNIQUE: Multidetector CT imaging of the chest was performed using the
standard protocol during bolus administration of intravenous
contrast. Multiplanar CT image reconstructions and MIPs were
obtained to evaluate the vascular anatomy.
CONTRAST:  100mL 4PUDQZ-6VQ IOPAMIDOL (4PUDQZ-6VQ) INJECTION 76%

[Series 5: thins · axial · 0.68mm/px · z∈[+1455,+1703]mm · 16 of 272 slices shown]
[im 12/272  lung]
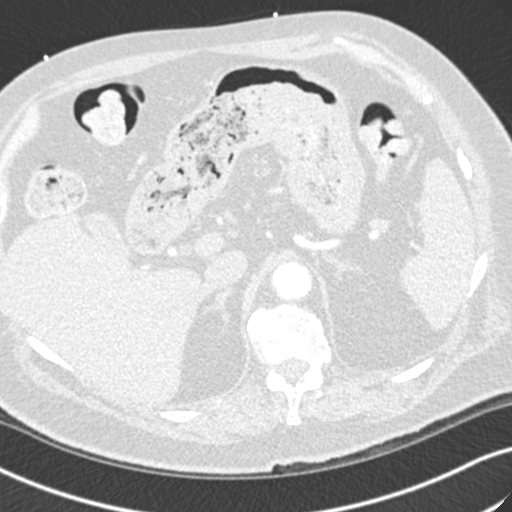
[im 36/272  soft-tissue]
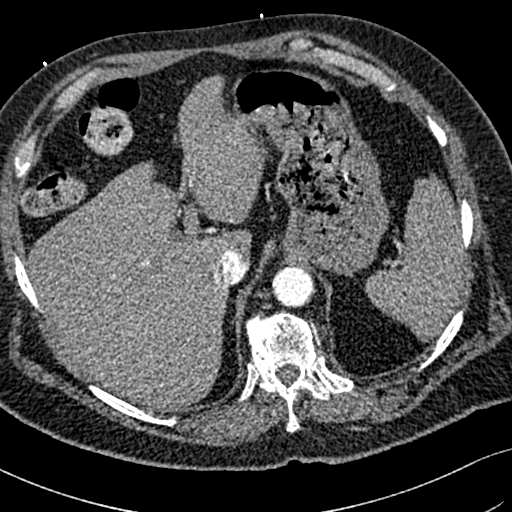
[im 48/272  lung]
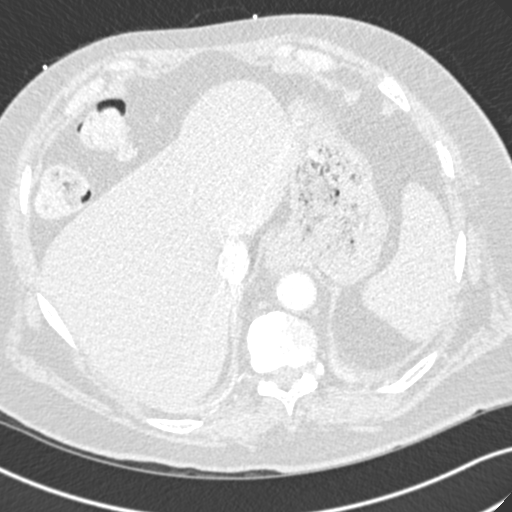
[im 59/272  soft-tissue]
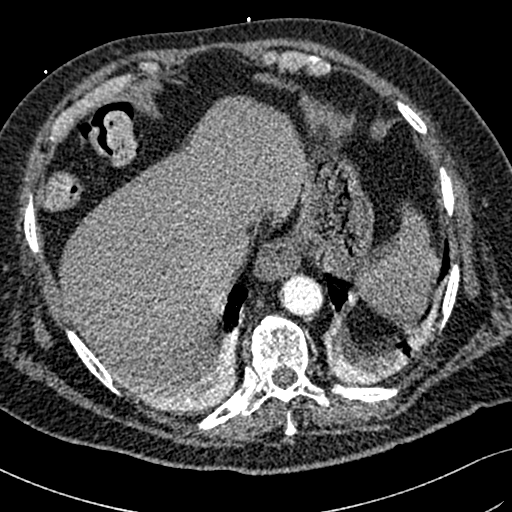
[im 83/272  lung]
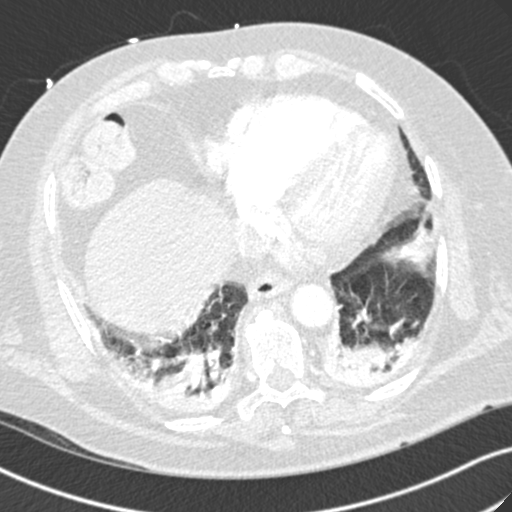
[im 95/272  soft-tissue]
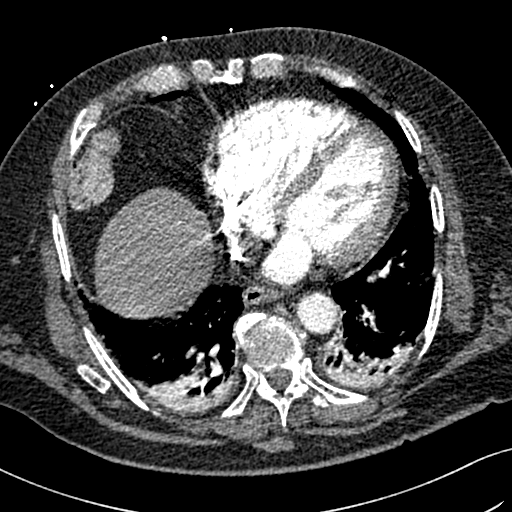
[im 107/272  lung]
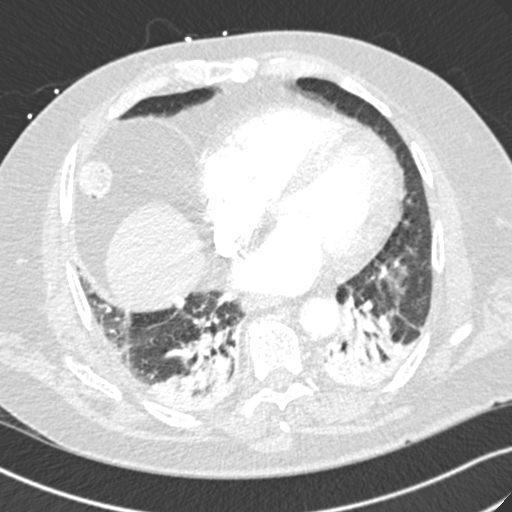
[im 130/272  soft-tissue]
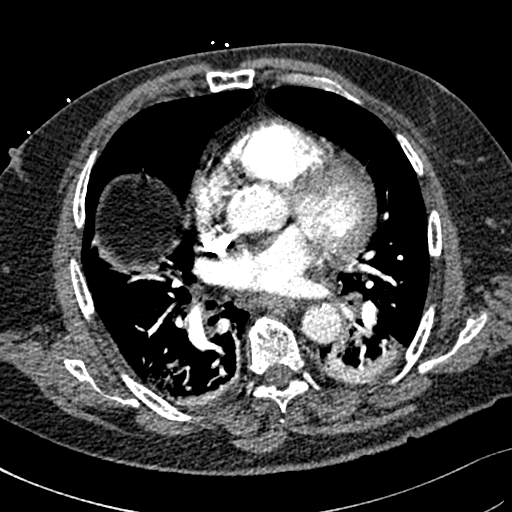
[im 142/272  lung]
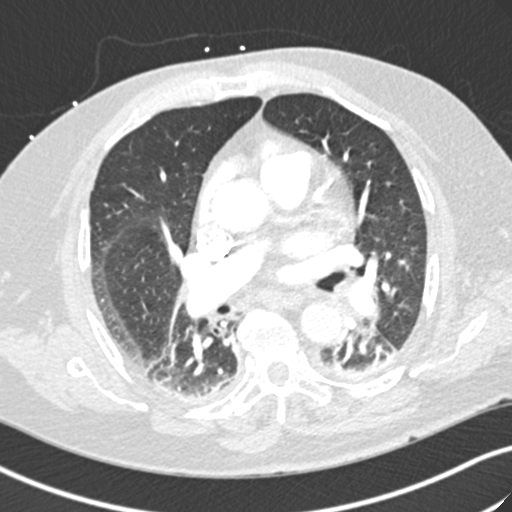
[im 165/272  soft-tissue]
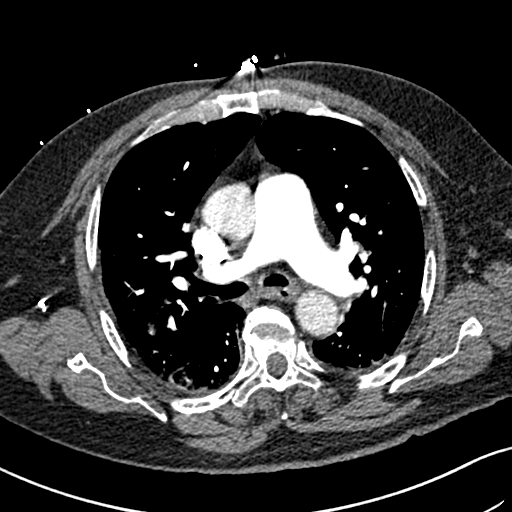
[im 177/272  lung]
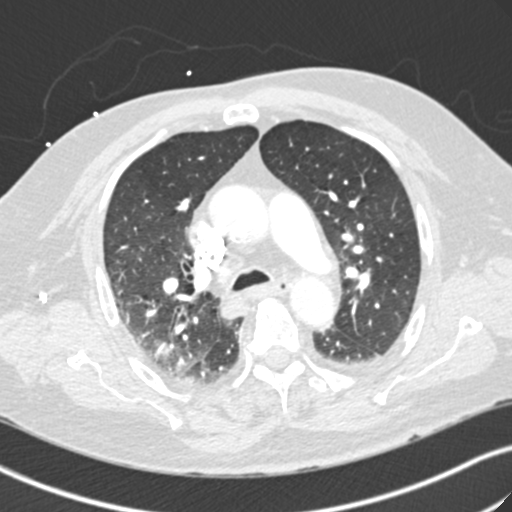
[im 189/272  soft-tissue]
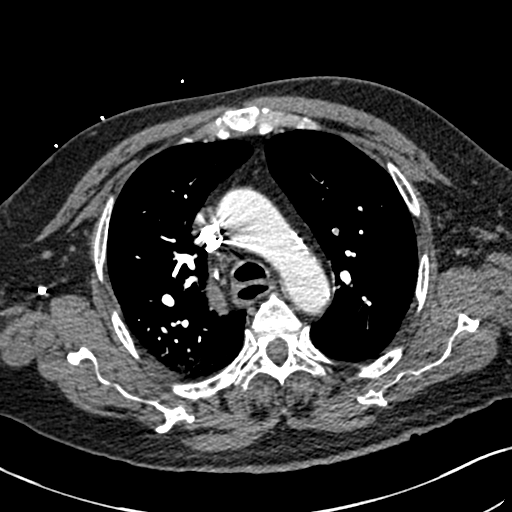
[im 213/272  lung]
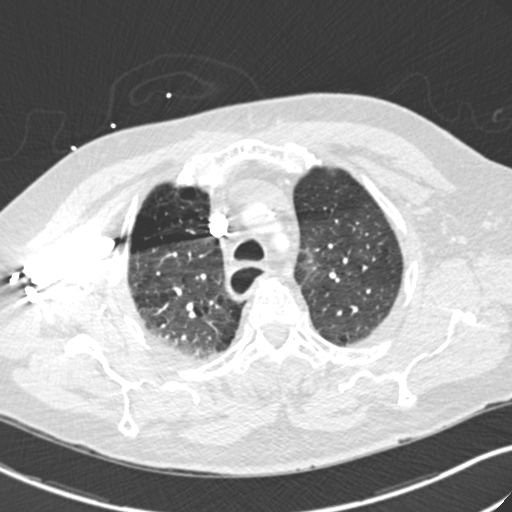
[im 224/272  soft-tissue]
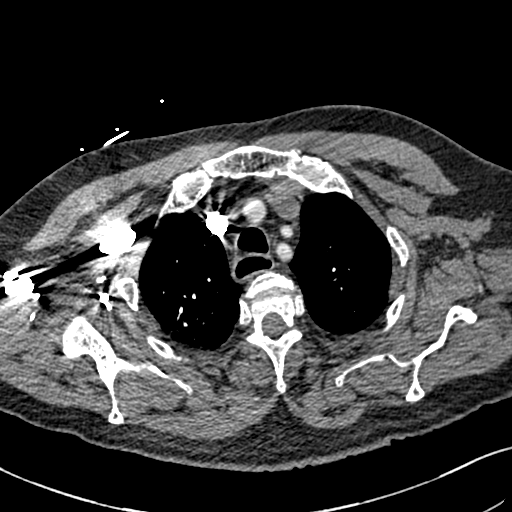
[im 236/272  lung]
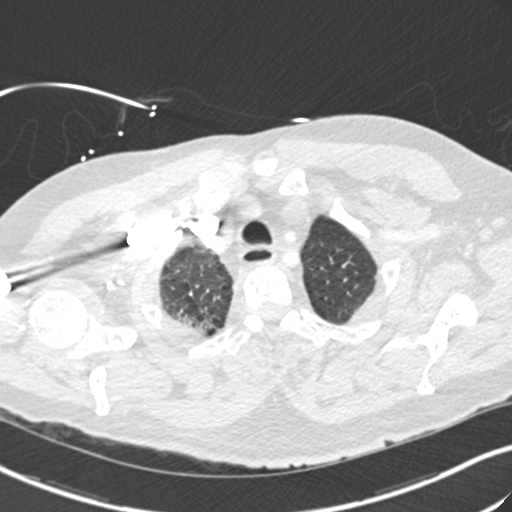
[im 260/272  soft-tissue]
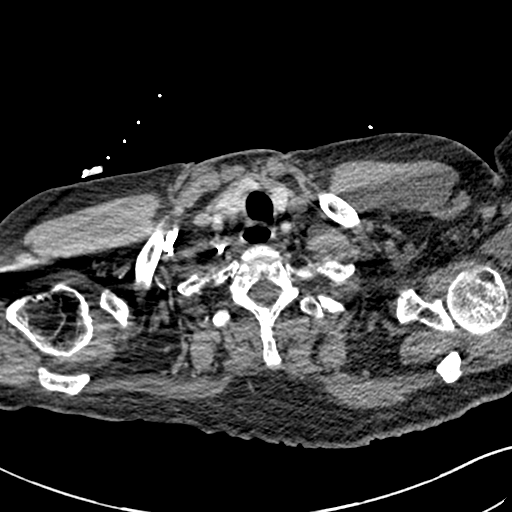

[Series 7: coronal mpr · coronal · 0.53mm/px · 2 of 106 slices shown]
[im 36/106  soft-tissue]
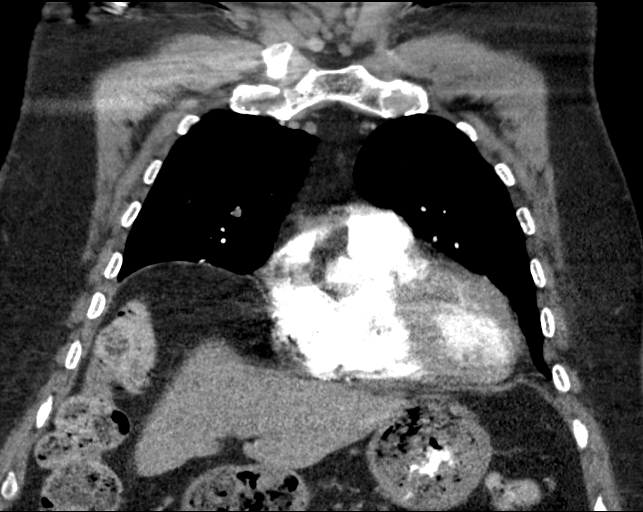
[im 71/106  soft-tissue]
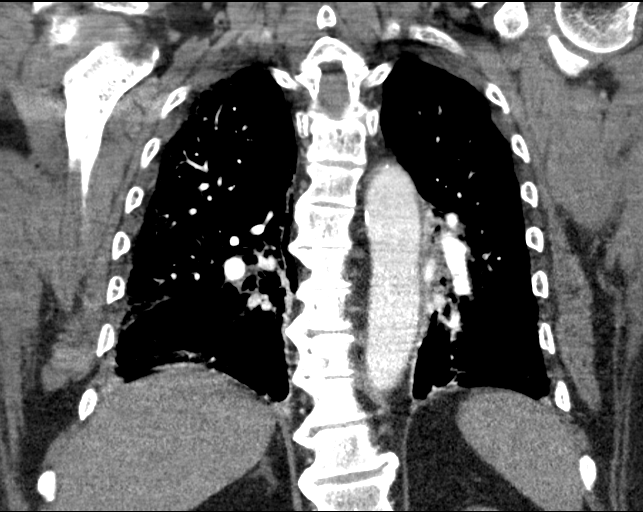

[18 of 46 positions shown; findings below may reference images not displayed]

FINDINGS: Cardiovascular: The pulmonary arteries are well opacified with
contrast to the level of the subsegmental branches. There is no
evidence of acute pulmonary embolism. There atherosclerosis of the
aorta, great vessels and coronary arteries. The heart size is
normal. There is no pericardial effusion.

Mediastinum/Nodes: There are no enlarged mediastinal, hilar or
axillary lymph nodes. There is a small hiatal hernia. No definite
abnormality of the trachea or thyroid gland on this examination.

Lungs/Pleura: No pneumothorax or significant pleural effusion. There
are persistent dependent airspace opacities in both lower lobes with
associated volume loss. Overall, these appear slightly improved
compared with the most recent study. There are new ground-glass
densities in the right upper lobe on images 48 through 59 series 6.
Small lingular opacity appears similar. No endobronchial lesion is
identified. Underlying emphysema noted.

Upper abdomen: No acute findings are seen within the visualized
upper abdomen. There is some reflux of contrast into the IVC.

Musculoskeletal/Chest wall: There is no chest wall mass or
suspicious osseous finding.

Review of the MIP images confirms the above findings.
IMPRESSION: 1. No evidence of acute pulmonary embolism.
2. Slightly improved, although persistent dependent airspace
opacities at both lung bases with associated volume loss. This could
be due to chronic atelectasis or recurrent aspiration. New patchy
ground-glass opacities in the right upper lobe, likely inflammatory.
3. Aortic Atherosclerosis (3DB56-H6A.A) and Emphysema ([id number]).

## 2019-10-02 IMAGING — CR DG LUMBAR SPINE COMPLETE 4+V
5 series · 5 of 5 positions shown · non-contrast
Comparison: Lumbar radiographs 01/26/2017

CLINICAL DATA: Recent fall.  Low back pain

EXAM:
LUMBAR SPINE - COMPLETE 4+ VIEW

[l-spine ap]
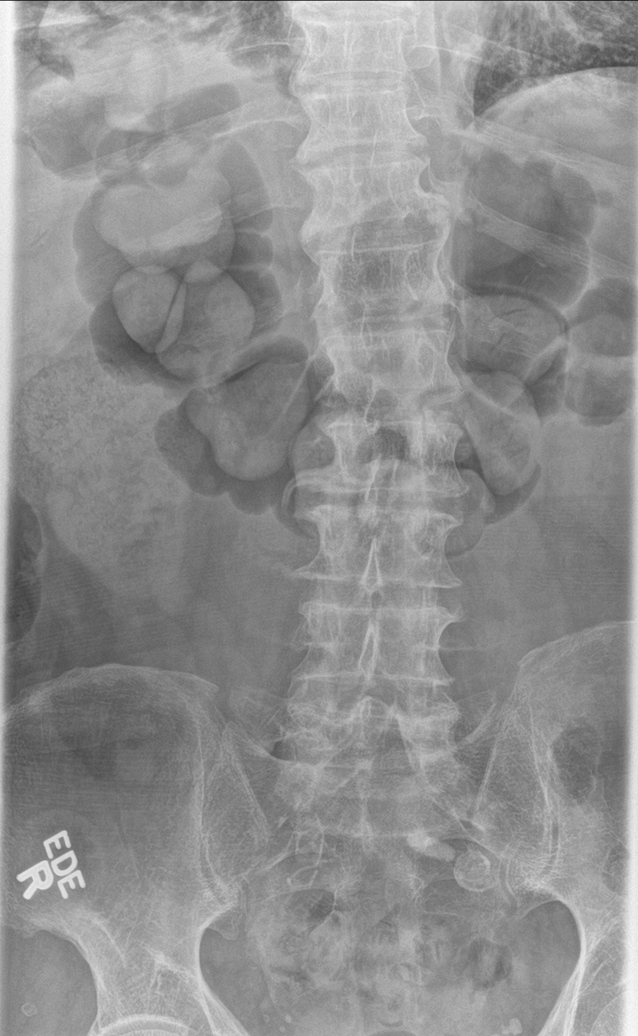

[l-spine obl (1 of 2)]
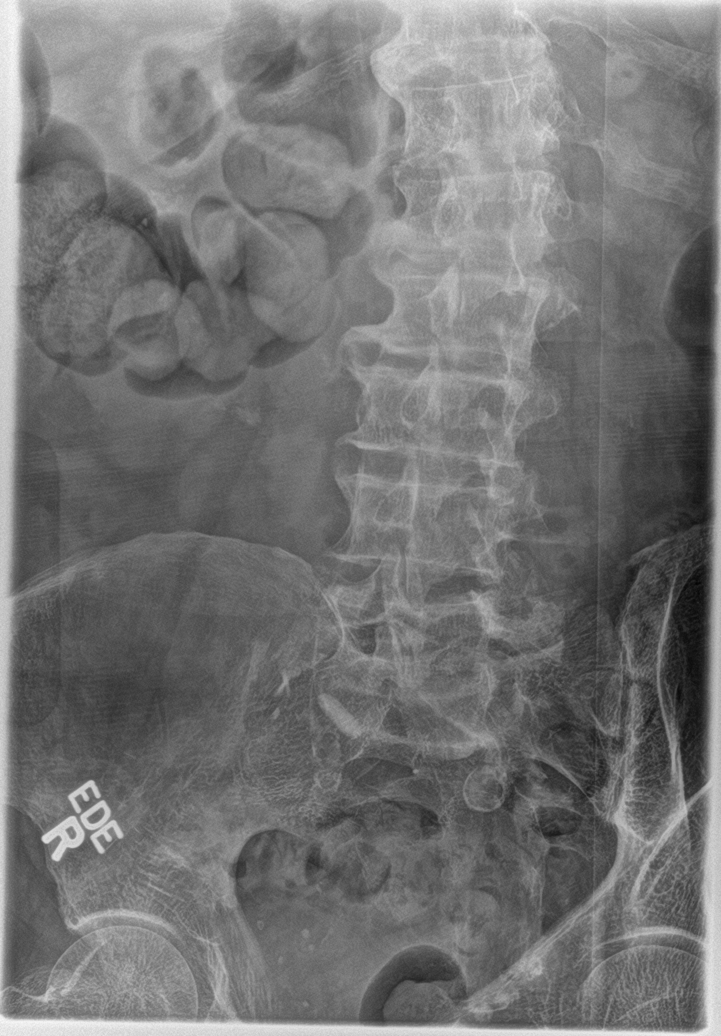

[l-spine obl (2 of 2)]
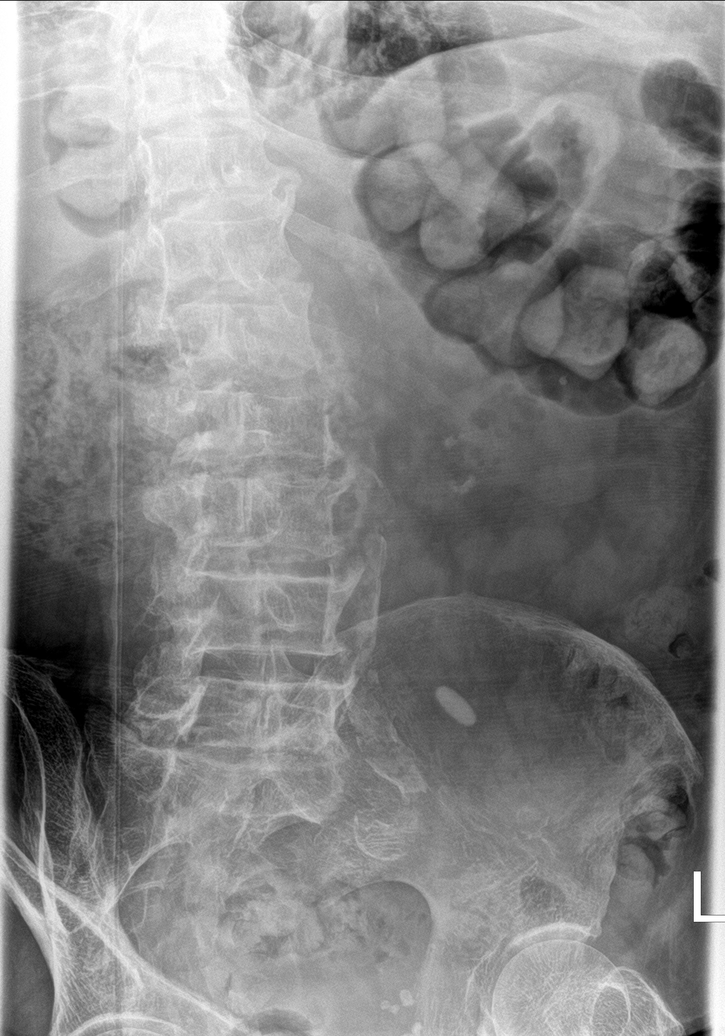

[l-spine lat]
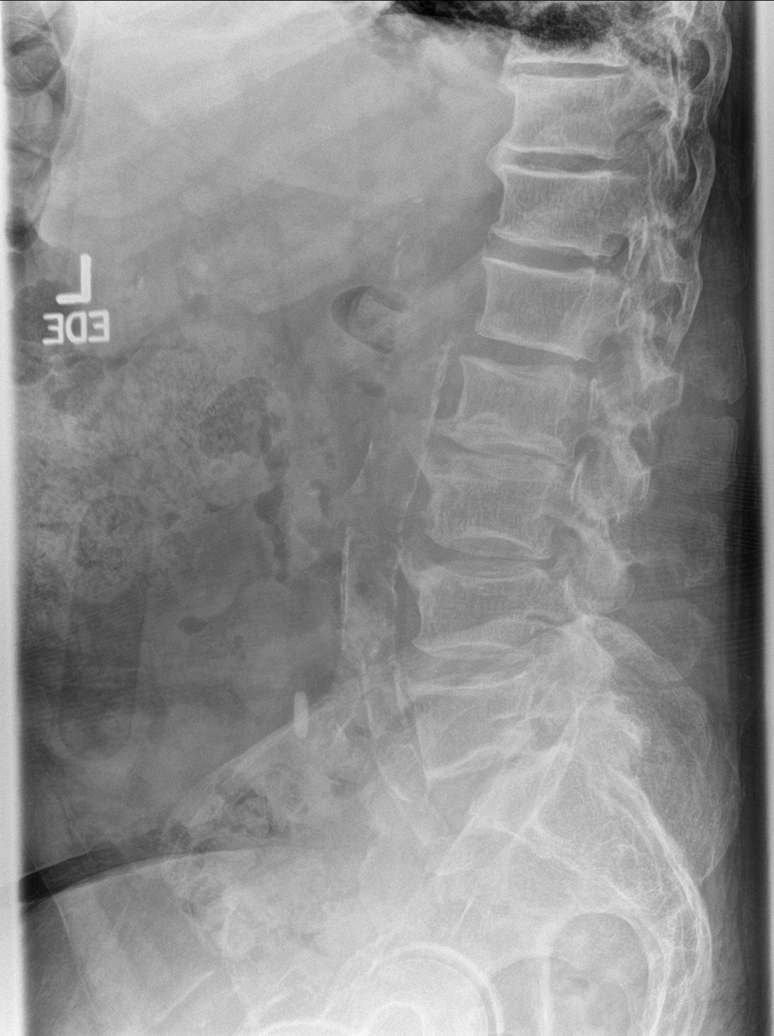

[l-spine spot]
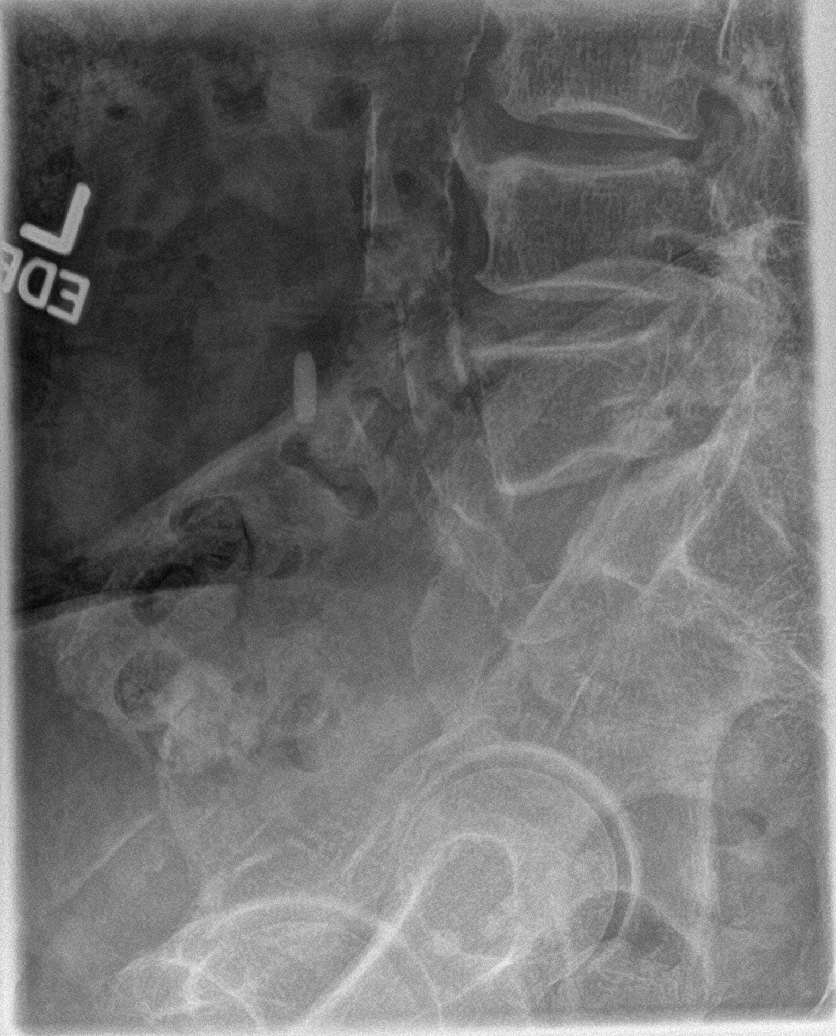

[5 of 5 positions shown; findings below may reference images not displayed]

FINDINGS: Negative for fracture

Multilevel degenerative change with anterior osteophyte formation.
Disc space narrowing most prominent L2-3. Grade 1 anterolisthesis
L5-S1 unchanged. No pars defect identified

Atherosclerotic aorta.  Constipation.  Mild scoliosis
IMPRESSION: Lumbar degenerative changes.  Negative for fracture

Constipation

## 2019-10-02 IMAGING — CT CT L SPINE W/O CM
3 series · 13 of 33 positions shown, 16 images · IV contrast (Omni 300)
Comparison: Radiographs same date.  Abdominal CT 12/10/2016.

CLINICAL DATA: Syncopal episode 3 days ago with fall.  Back pain.

EXAM:
CT LUMBAR SPINE WITHOUT CONTRAST
TECHNIQUE: Multidetector CT imaging of the lumbar spine was performed without
intravenous contrast administration. Multiplanar CT image
reconstructions were also generated.

[Series 1: a/p w/ 5mm · axial · 0.40mm/px · z∈[+1029,+1181]mm · 5 of 112 slices shown, 7 images]
[im 18/112  soft-tissue]
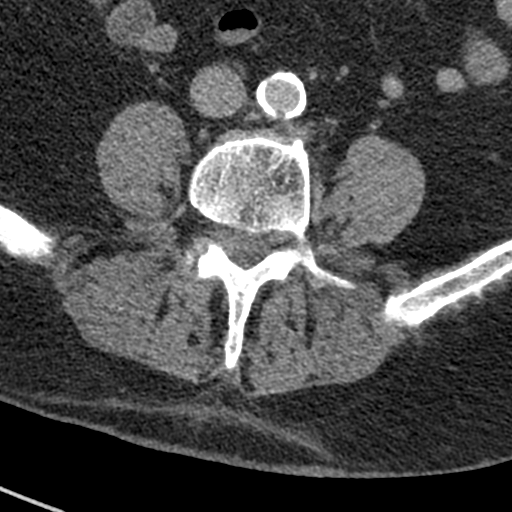
[im 18/112  bone]
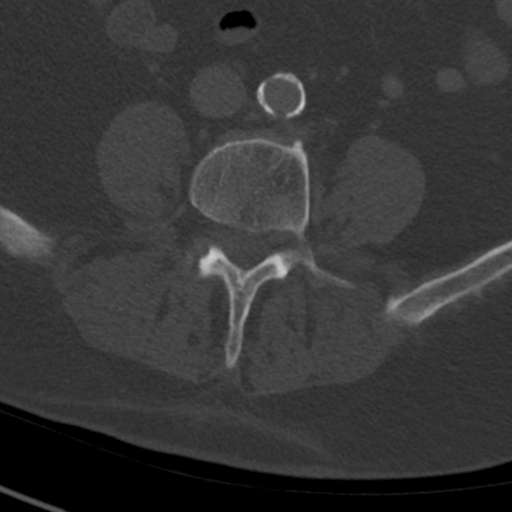
[im 35/112  bone]
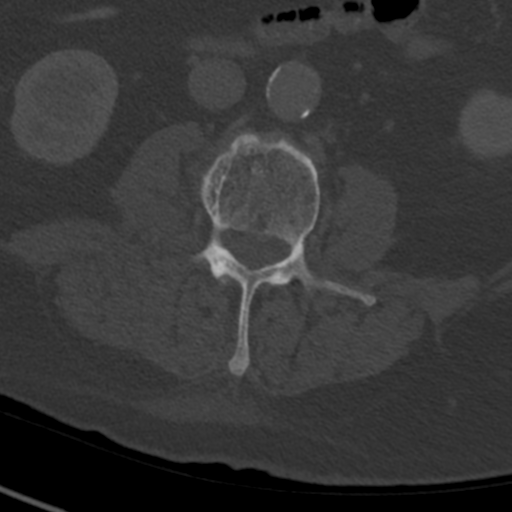
[im 60/112  bone]
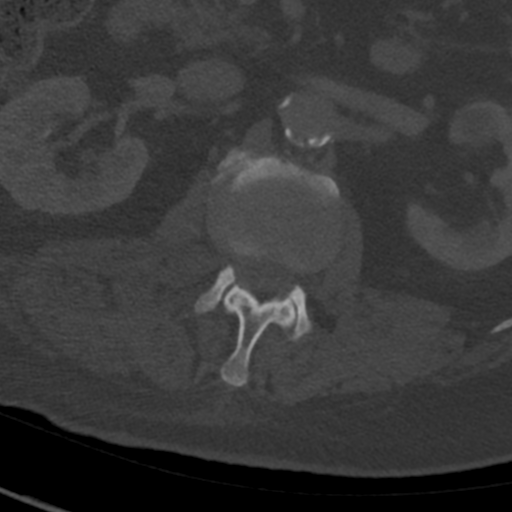
[im 77/112  bone]
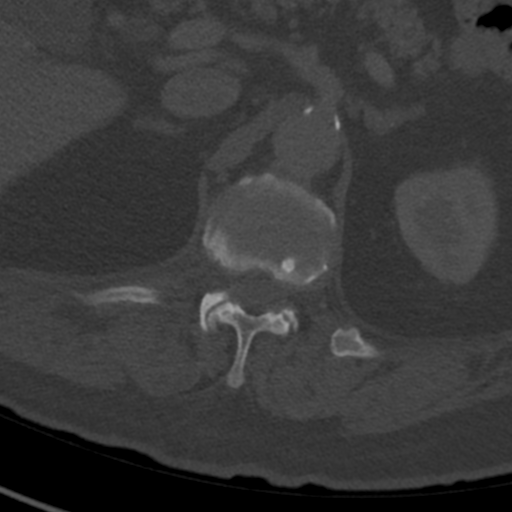
[im 94/112  soft-tissue]
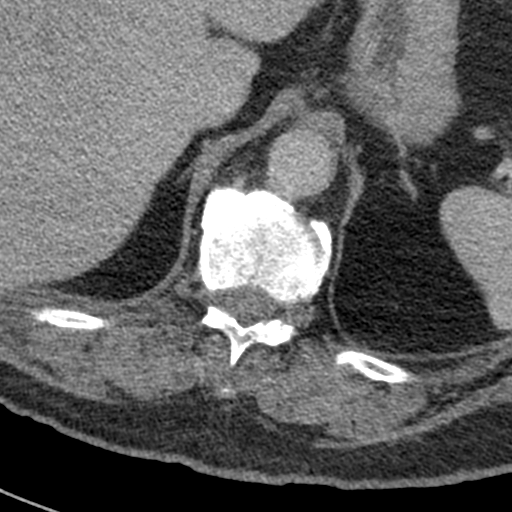
[im 94/112  bone]
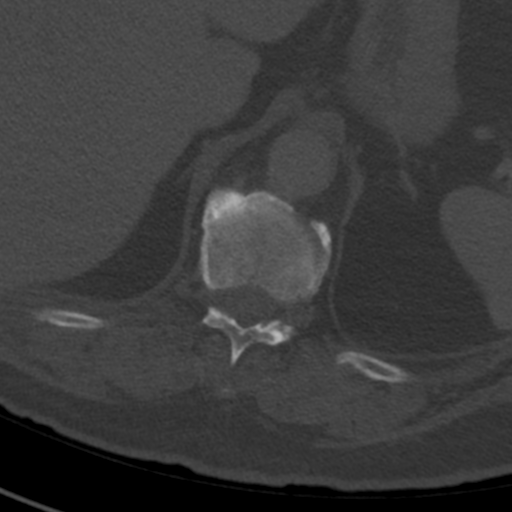

[Series 4: lspine bone coronal · coronal · 0.39mm/px · 3 of 87 slices shown (1 of 2)]
[im 18/87  bone]
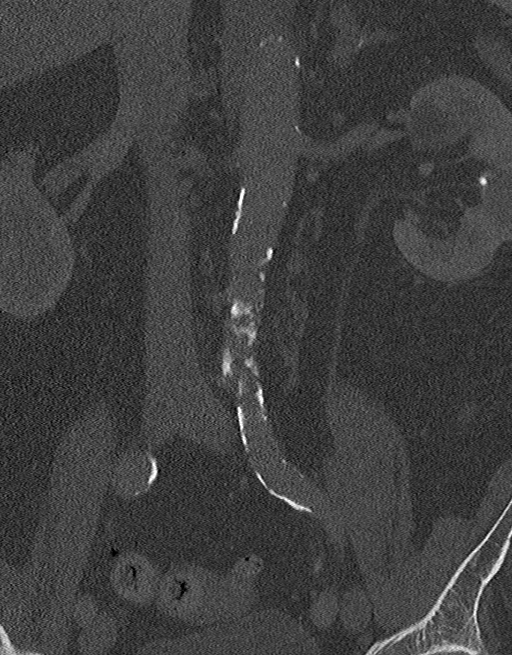
[im 35/87  bone]
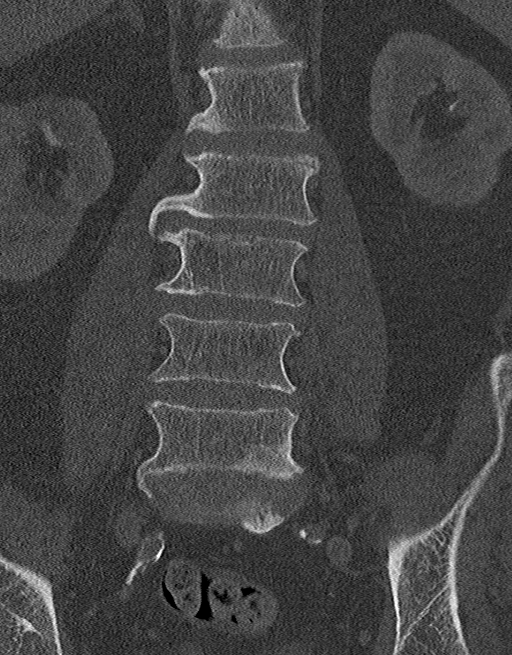
[im 52/87  bone]
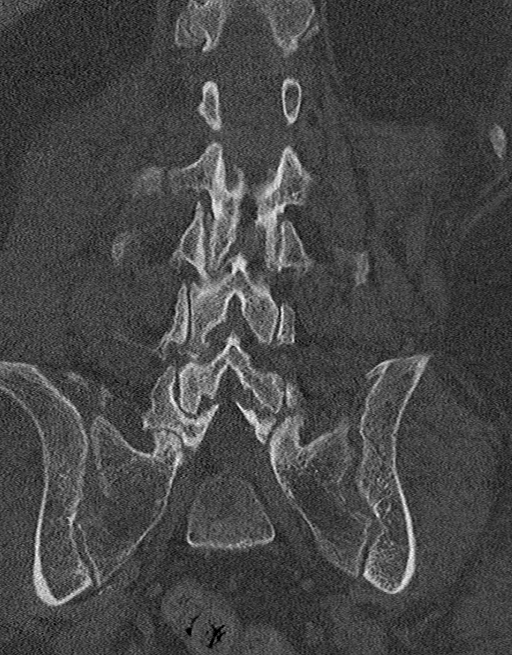

[Series 5: lspine bone coronal · sagittal · 0.40mm/px · 5 of 114 slices shown, 6 images (2 of 2)]
[im 38/114  bone]
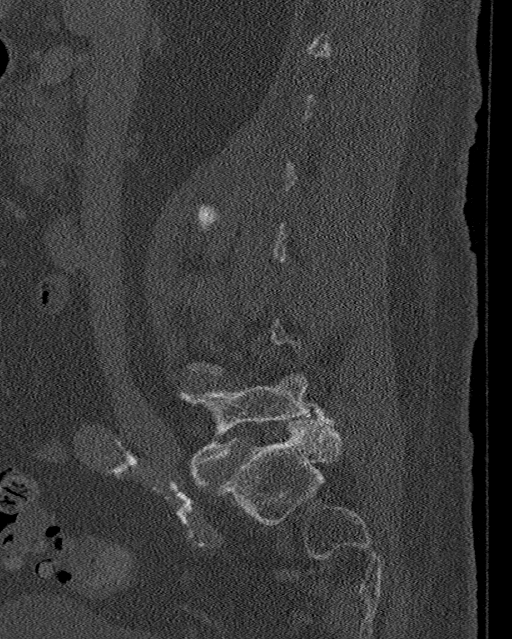
[im 48/114  bone]
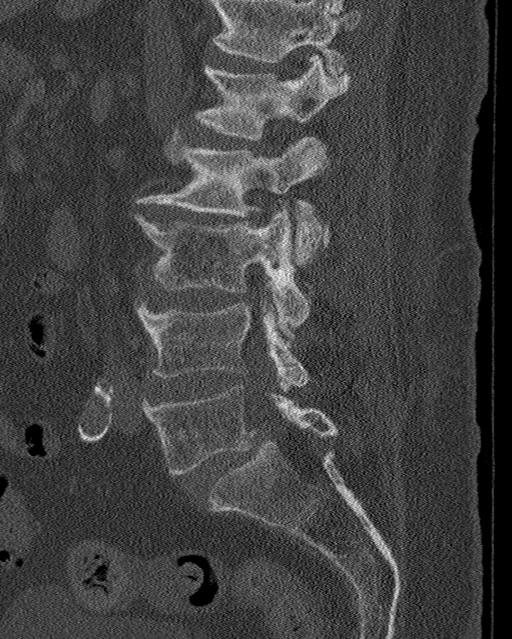
[im 57/114  soft-tissue]
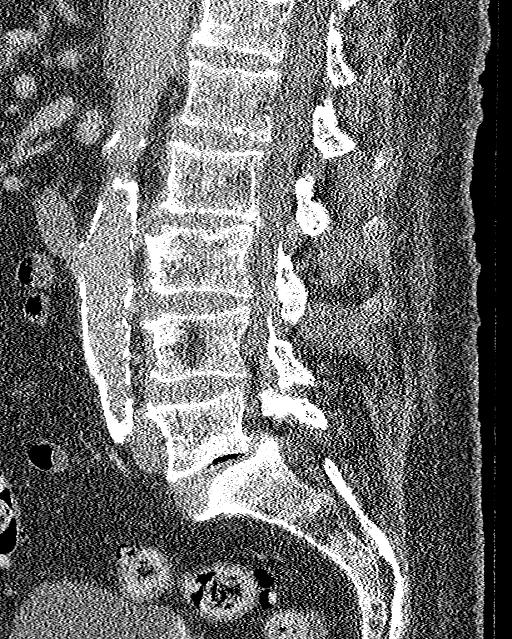
[im 57/114  bone]
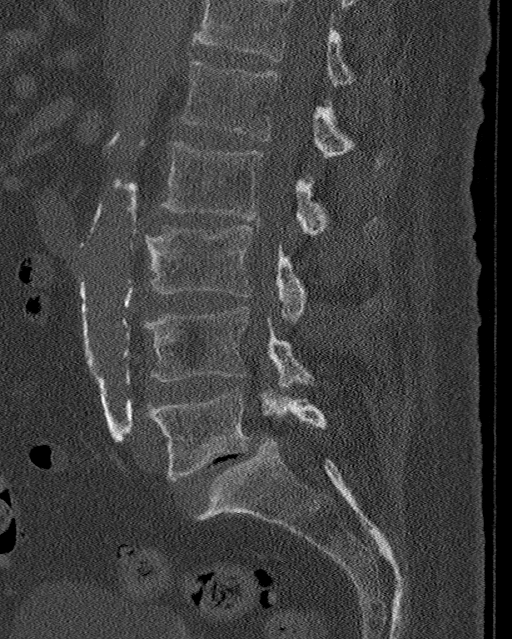
[im 66/114  bone]
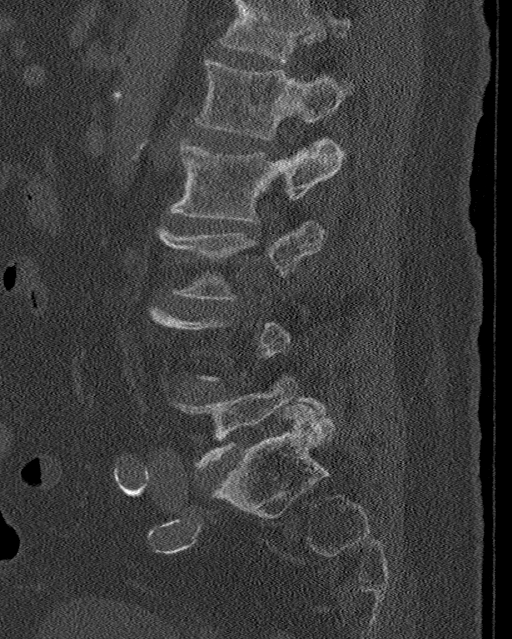
[im 76/114  bone]
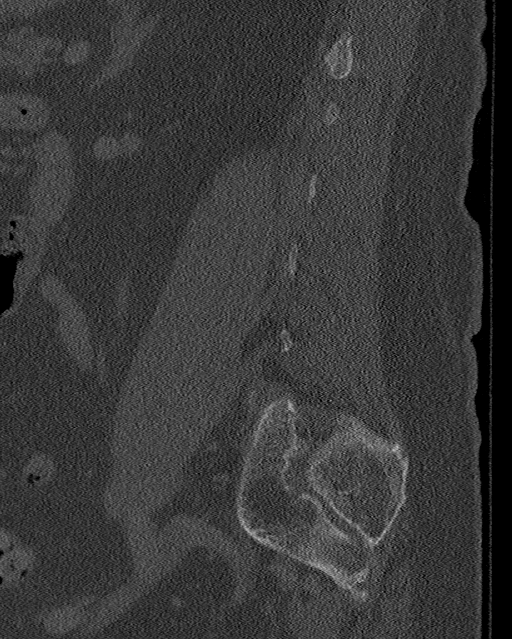

[13 of 33 positions shown; findings below may reference images not displayed]

FINDINGS: Images for this study were generated from the data acquired during
CTs of the chest, abdomen and pelvis. Those studies are dictated
separately.

Segmentation: 5 lumbar type vertebral bodies.

Alignment: Chronic degenerative anterolisthesis at L5-S1. Stable
from previous study.

Vertebrae: The bones are mildly demineralized. No acute vertebral
body fractures are identified. There are nondisplaced fractures of
the right L1 and L2 transverse processes. There are fractures of the
right 11th and 12th ribs. There are chronic endplate degenerative
changes throughout the spine. No evidence of pars defect.

Paraspinal and other soft tissues: No significant paraspinal
hematoma. Retroperitoneal findings are described separately.

Disc levels:

L1-2: No significant findings.

L2-3: Annular disc bulging with endplate osteophytes asymmetric to
the right. Mild facet and ligamentous hypertrophy. These factors
contribute to mild spinal stenosis and mild right foraminal
narrowing.

L3-4: Disc height is relatively preserved. Mild disc bulging,
endplate osteophytes and facet/ligamentous hypertrophy. Resulting
mild-to-moderate spinal stenosis.

L4-5: Disc height is relatively preserved. Mild annular disc
bulging, facet and ligamentous hypertrophy. Mild resulting spinal
stenosis.

L5-S1: Chronic loss of disc height with annular disc bulging and
endplate osteophytes. Moderate facet hypertrophy accounting for the
grade 1 anterolisthesis. Resulting chronic moderate foraminal
narrowing bilaterally.
IMPRESSION: 1. Acute fractures of the right L1 and L2 transverse processes. No
evidence of vertebral body fracture.
2. Lower right rib fractures.
3. Multilevel spondylosis as described, similar to previous
abdominopelvic CT.
4. Intra-abdominal findings described separately.

## 2019-10-02 IMAGING — CR DG CHEST 2V
2 series · 2 of 2 positions shown · non-contrast
Comparison: CT chest 03/05/2017 and chest radiograph 03/04/2017.

CLINICAL DATA: Fall, lower back pain and shortness of breath,
initial encounter.

EXAM:
CHEST  2 VIEW

[chest lat]
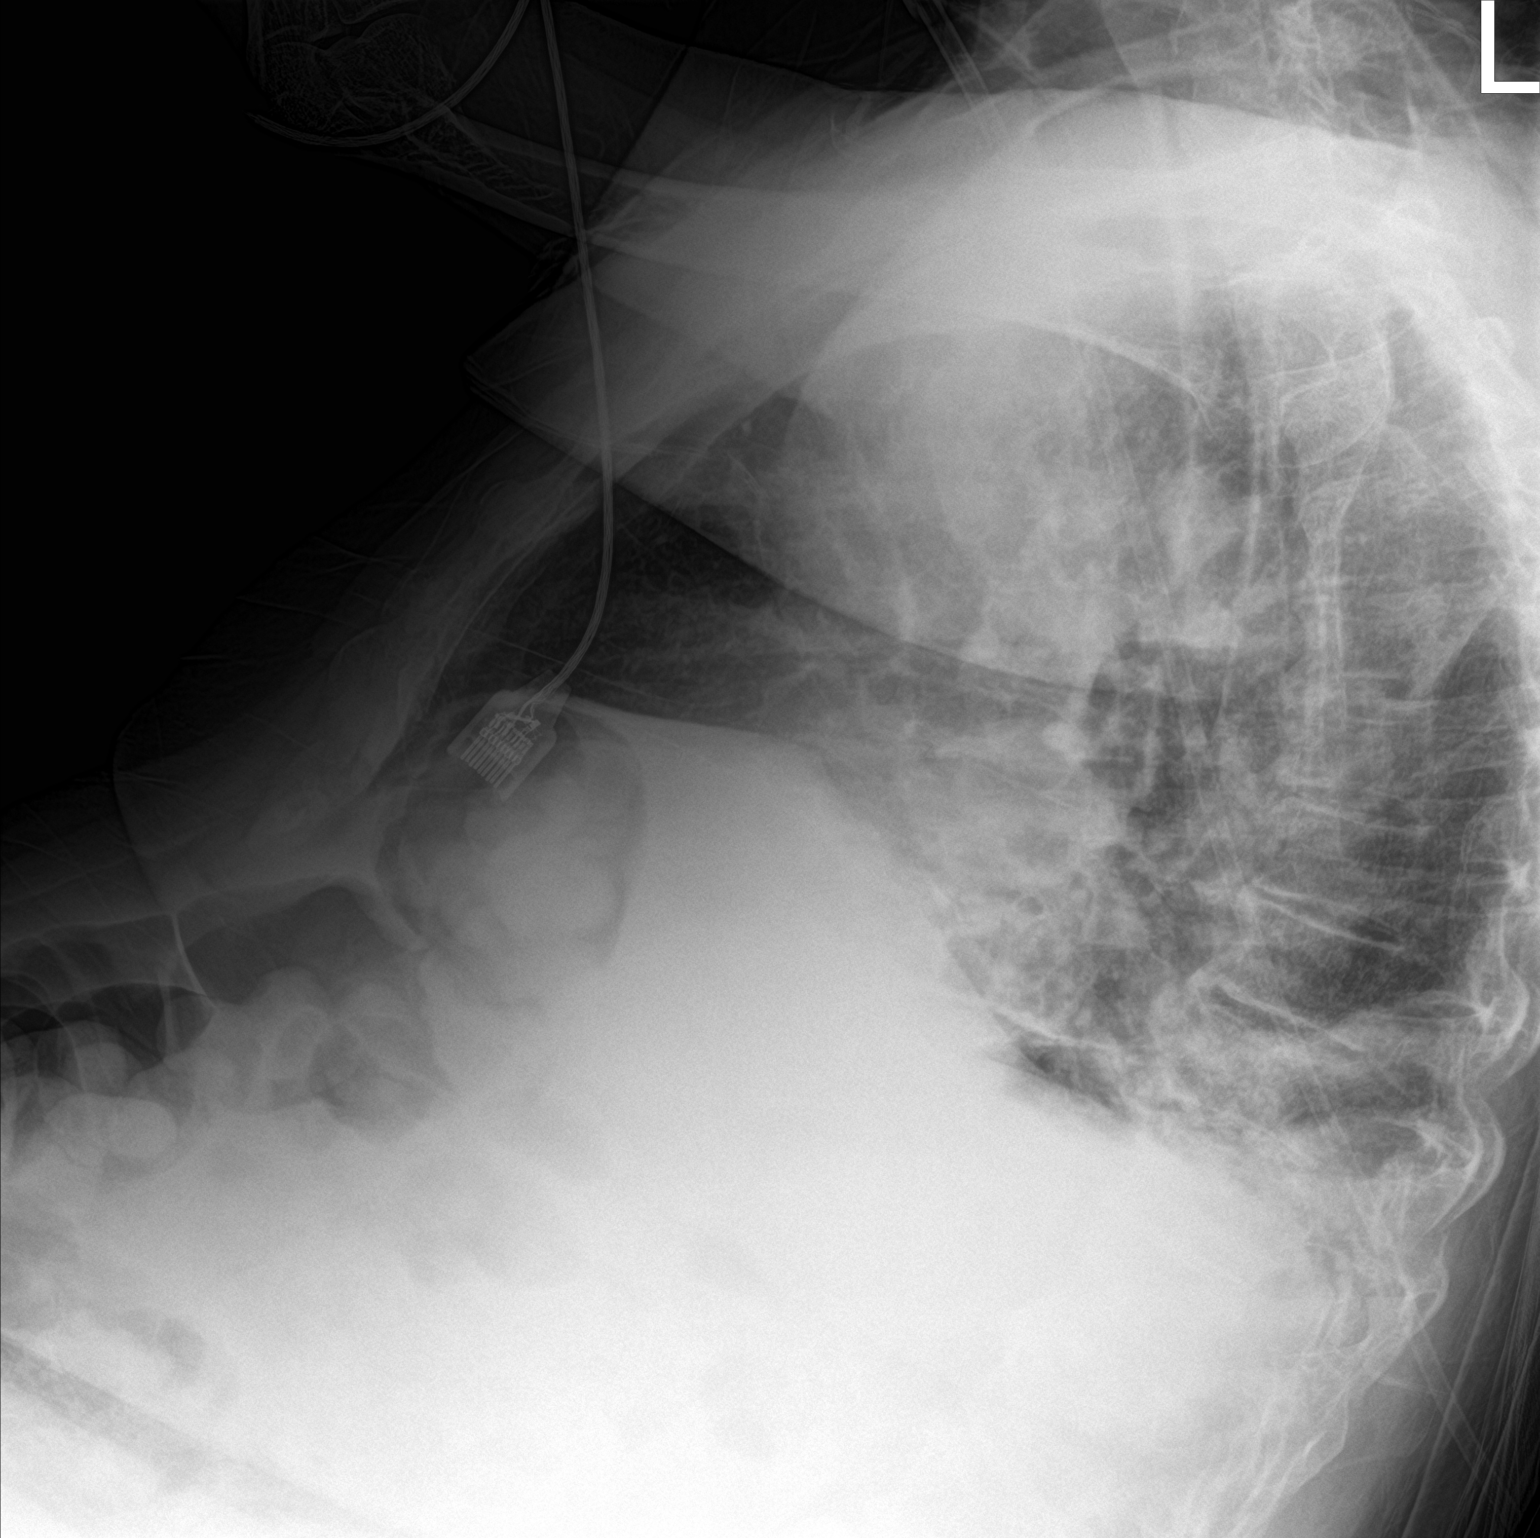

[chest ap]
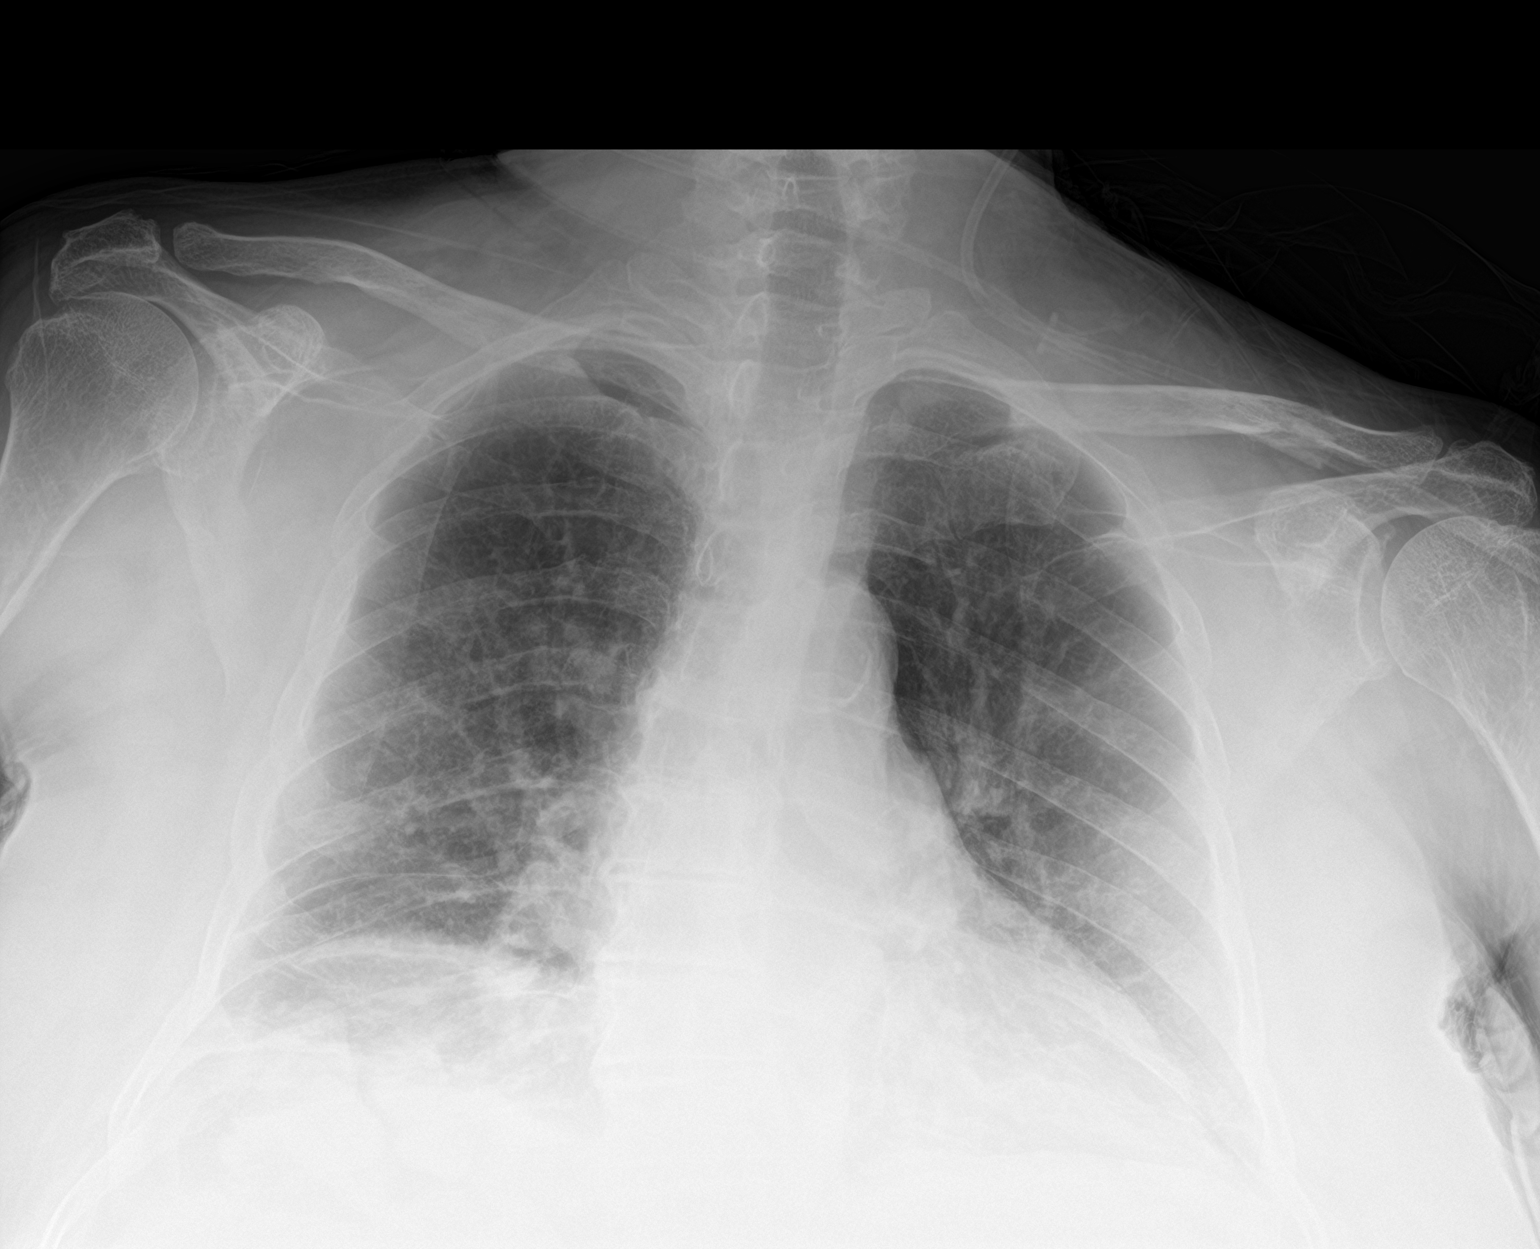

[2 of 2 positions shown; findings below may reference images not displayed]

FINDINGS: Trachea is midline. Heart size stable. Thoracic aorta is calcified.
Mild bibasilar atelectasis. No airspace consolidation or pleural
fluid. No pneumothorax. Osseous structures appear grossly intact.
IMPRESSION: Bibasilar atelectasis.

## 2019-10-02 IMAGING — CT CT ABD-PELV W/ CM
3 of 11 series · 11 of 46 positions shown, 17 images · IV contrast (iopamidol)
Comparison: CT scans March 05, 2017 and December 10, 2016.

CLINICAL DATA: Syncope. Blunt abdominal trauma after fall 3 days
ago.

EXAM:
CT ANGIOGRAPHY CHEST
CT ABDOMEN AND PELVIS WITH CONTRAST
TECHNIQUE: Multidetector CT imaging of the chest was performed using the
standard protocol during bolus administration of intravenous
contrast. Multiplanar CT image reconstructions and MIPs were
obtained to evaluate the vascular anatomy. Multidetector CT imaging
of the abdomen and pelvis was performed using the standard protocol
during bolus administration of intravenous contrast.
CONTRAST:  100mL [id number] IOPAMIDOL ([id number]-24B) INJECTION 76%

[Series 7: pe thins · axial · 0.86mm/px · z∈[+1160,+1306]mm · 5 of 290 slices shown]
[im 21/290  soft-tissue]
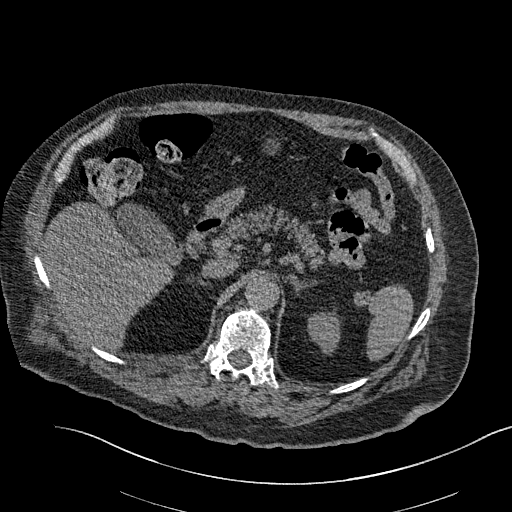
[im 62/290  soft-tissue]
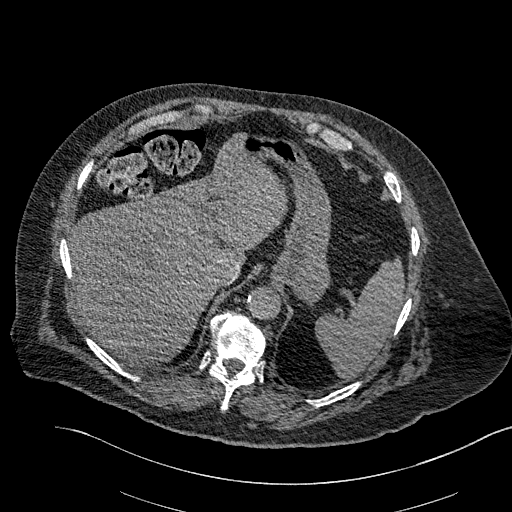
[im 104/290  soft-tissue]
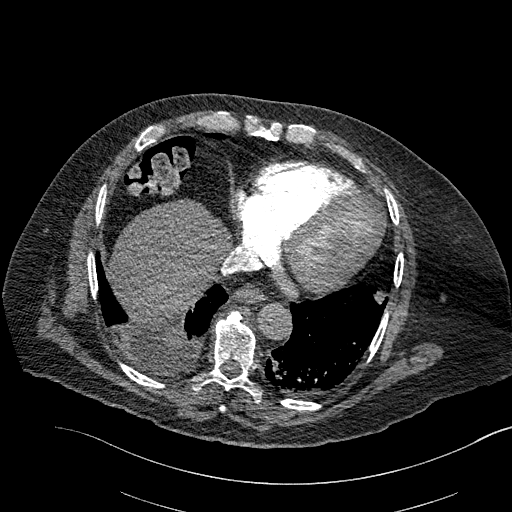
[im 124/290  soft-tissue]
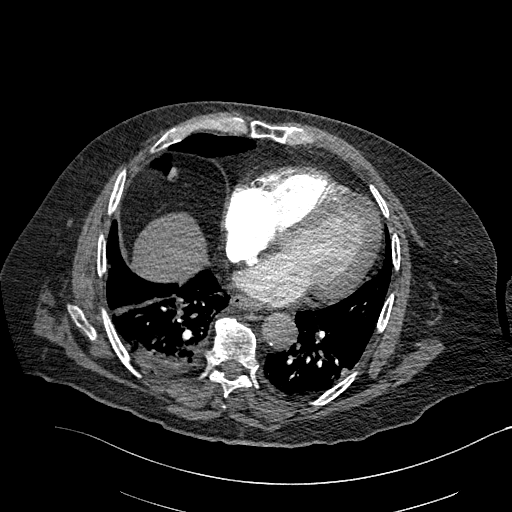
[im 166/290  soft-tissue]
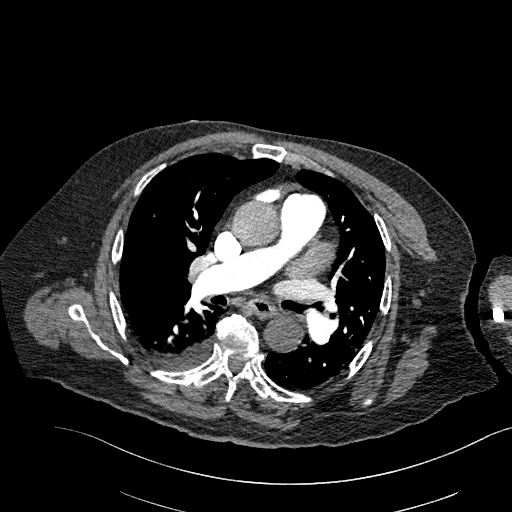

[Series 12: a/p w/ 5mm · axial · 0.83mm/px · z∈[+890,+1205]mm · 4 of 107 slices shown, 9 images]
[im 22/107  soft-tissue]
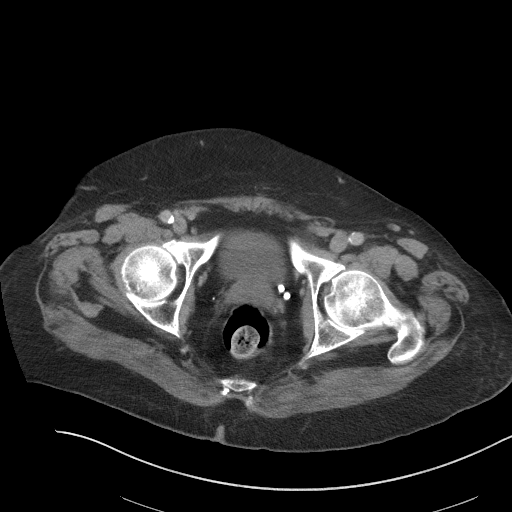
[im 22/107  lung]
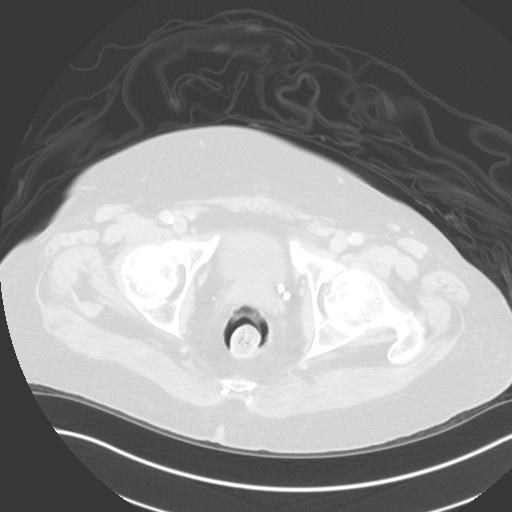
[im 22/107  bone]
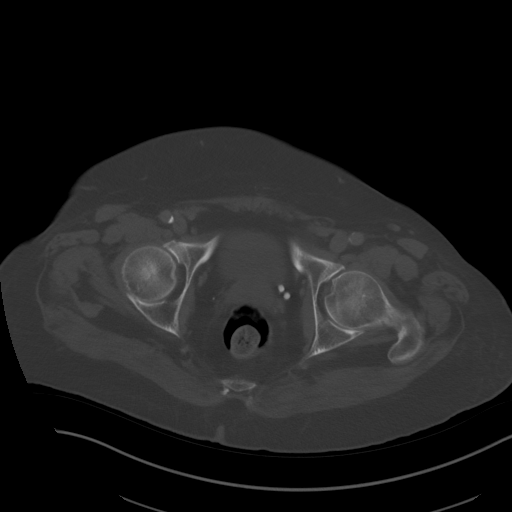
[im 43/107  soft-tissue]
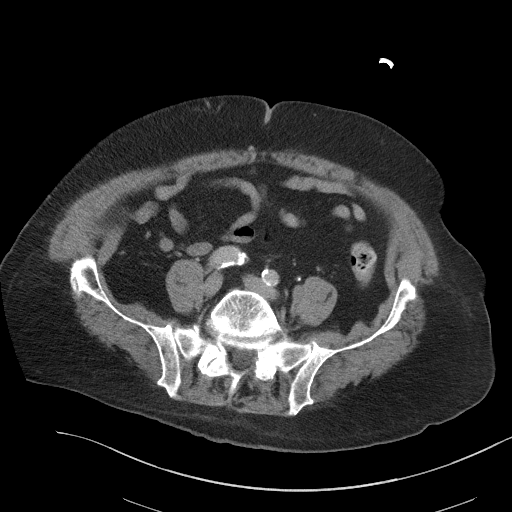
[im 43/107  lung]
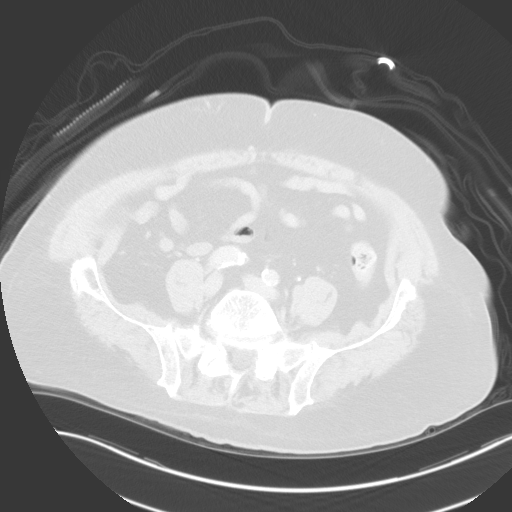
[im 64/107  soft-tissue]
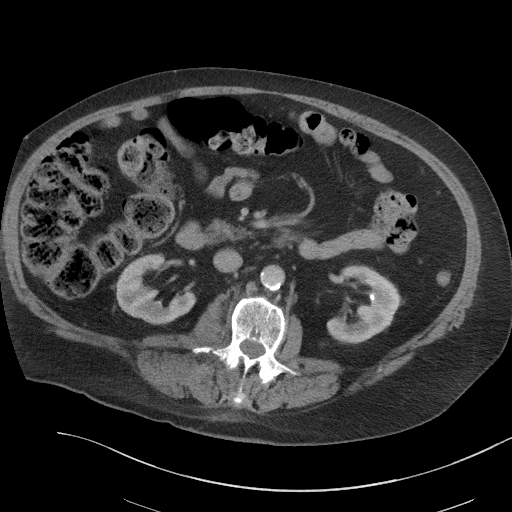
[im 64/107  lung]
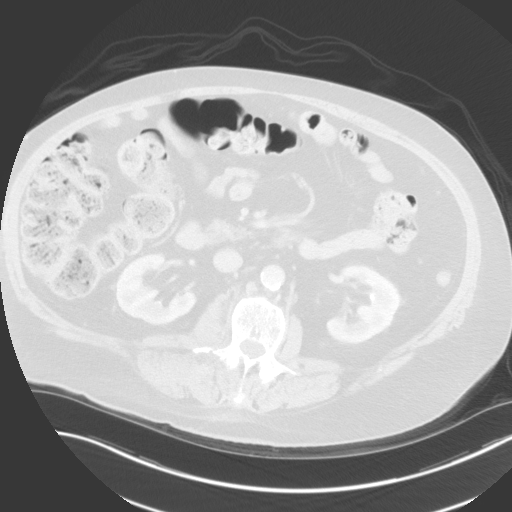
[im 85/107  soft-tissue]
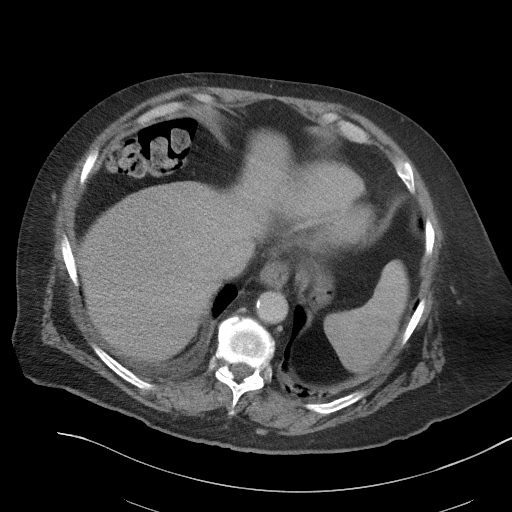
[im 85/107  lung]
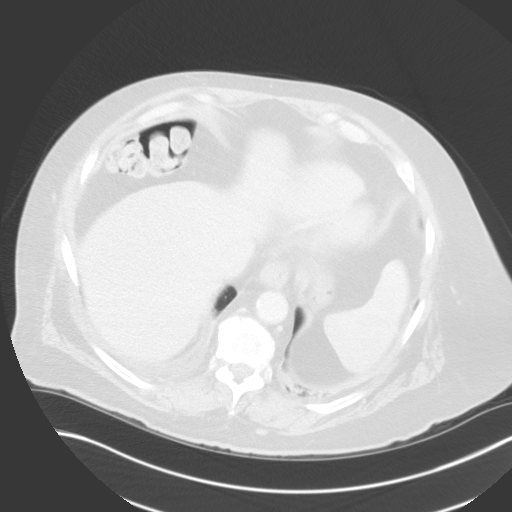

[Series 15: a/p w/ cor · coronal · 0.87mm/px · 2 of 151 slices shown, 3 images]
[im 51/151  soft-tissue]
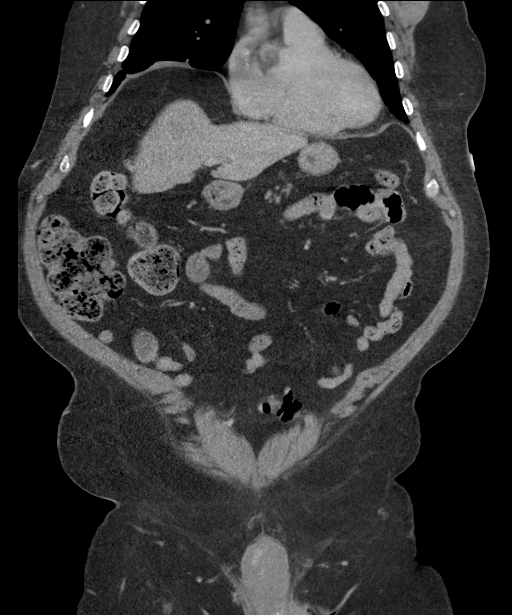
[im 51/151  bone]
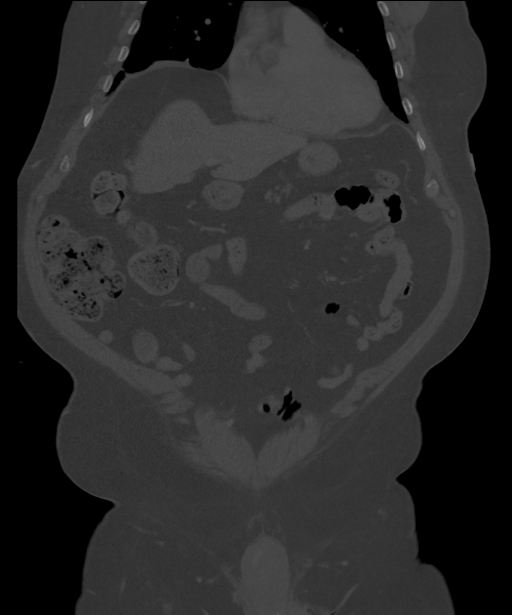
[im 101/151  soft-tissue]
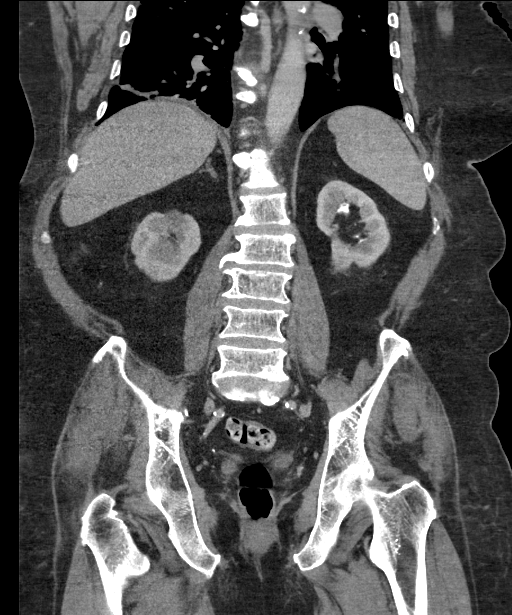

[11 of 46 positions shown; findings below may reference images not displayed]

FINDINGS: CTA CHEST FINDINGS

Cardiovascular: Satisfactory opacification of the pulmonary arteries
to the segmental level. No evidence of pulmonary embolism. Normal
heart size. No pericardial effusion. Atherosclerosis of thoracic
aorta is noted without aneurysm formation. No pericardial effusion
is noted. Mild coronary artery calcifications are noted.

Mediastinum/Nodes: No enlarged mediastinal, hilar, or axillary lymph
nodes. Thyroid gland, trachea, and esophagus demonstrate no
significant findings.

Lungs/Pleura: No pneumothorax is noted. Small right pleural effusion
is noted with adjacent subsegmental atelectasis. Mild emphysematous
disease is noted in the upper lobes bilaterally. Minimal left
posterior basilar subsegmental atelectasis is noted.

Musculoskeletal: No chest wall abnormality. No acute or significant
osseous findings.

Review of the MIP images confirms the above findings.

CT ABDOMEN and PELVIS FINDINGS

Hepatobiliary: No focal liver abnormality is seen. No gallstones,
gallbladder wall thickening, or biliary dilatation.

Pancreas: Unremarkable. No pancreatic ductal dilatation or
surrounding inflammatory changes.

Spleen: Normal in size without focal abnormality.

Adrenals/Urinary Tract: Adrenal glands are unremarkable. Stable
bilateral renal cysts are noted. Bilateral nonobstructive
nephrolithiasis is noted. Right renal upper pole scarring is noted.
No hydronephrosis or renal obstruction is noted. Urinary bladder is
unremarkable.

Stomach/Bowel: Stomach is within normal limits. Appendix appears
normal. No evidence of bowel wall thickening, distention, or
inflammatory changes. Sigmoid diverticulosis is noted.

Vascular/Lymphatic: Aortic atherosclerosis. No enlarged abdominal or
pelvic lymph nodes.

Reproductive: Prostate is unremarkable.

Other: No abdominal wall hernia or abnormality. No abdominopelvic
ascites.

Musculoskeletal: No acute or significant osseous findings.

Review of the MIP images confirms the above findings.
IMPRESSION: No definite evidence of pulmonary embolus.

Coronary artery calcifications are noted suggesting coronary artery
disease.

Small right pleural effusion is noted with adjacent subsegmental
atelectasis.

Sigmoid diverticulosis is noted without inflammation.

Bilateral nonobstructive nephrolithiasis.

No evidence of traumatic injury seen in the abdomen or pelvis.

Aortic Atherosclerosis (GM4WC-0UW.W) and Emphysema (GM4WC-EAV.A).

## 2019-12-12 IMAGING — DX DG CHEST 2V
2 series · 2 of 2 positions shown · non-contrast
Comparison: 04/06/2017

CLINICAL DATA: Chest pain for 12 hours radiating to the back.
Previous history of heart attack and stroke. Falls over the last 2
days.

EXAM:
CHEST - 2 VIEW

[chest lat]
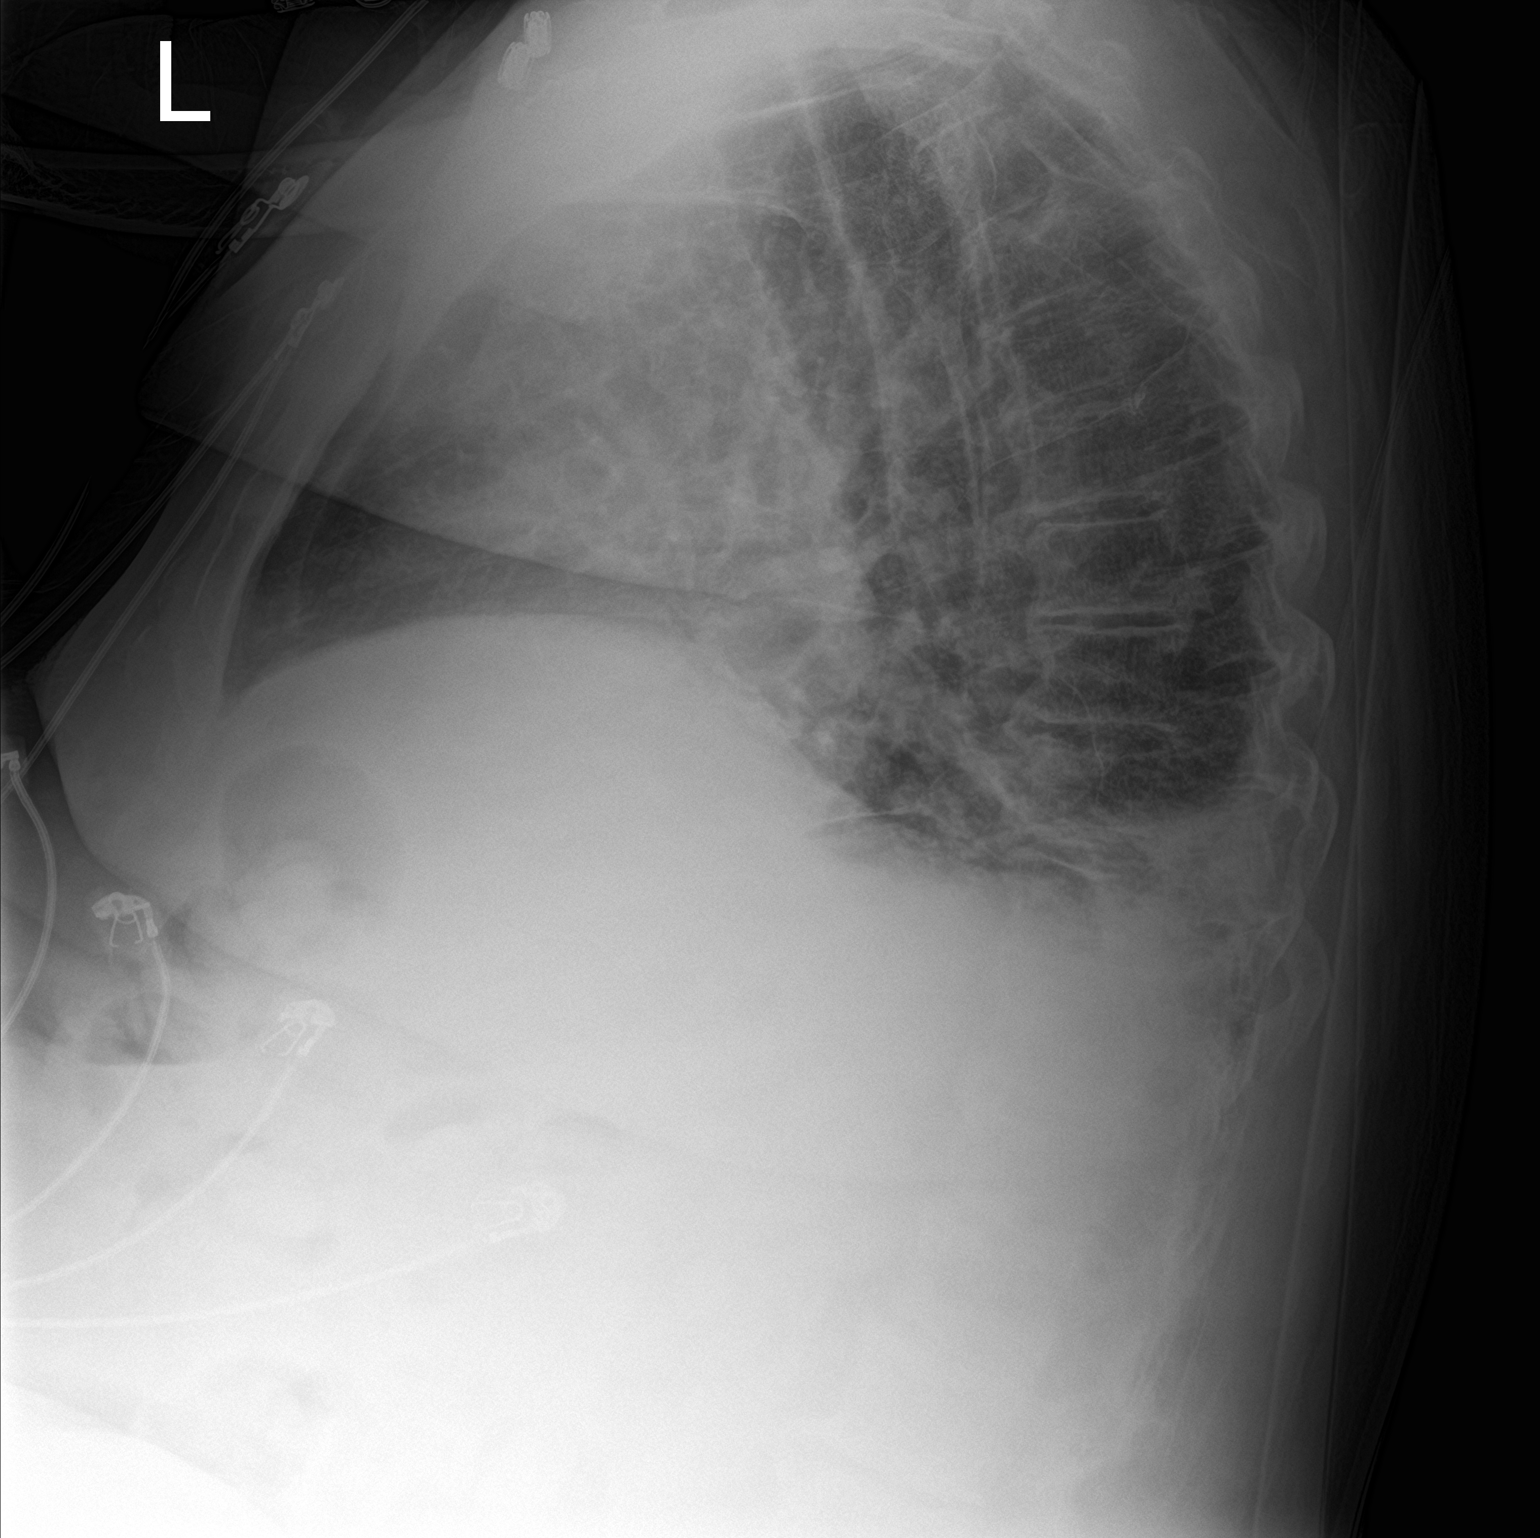

[chest ap]
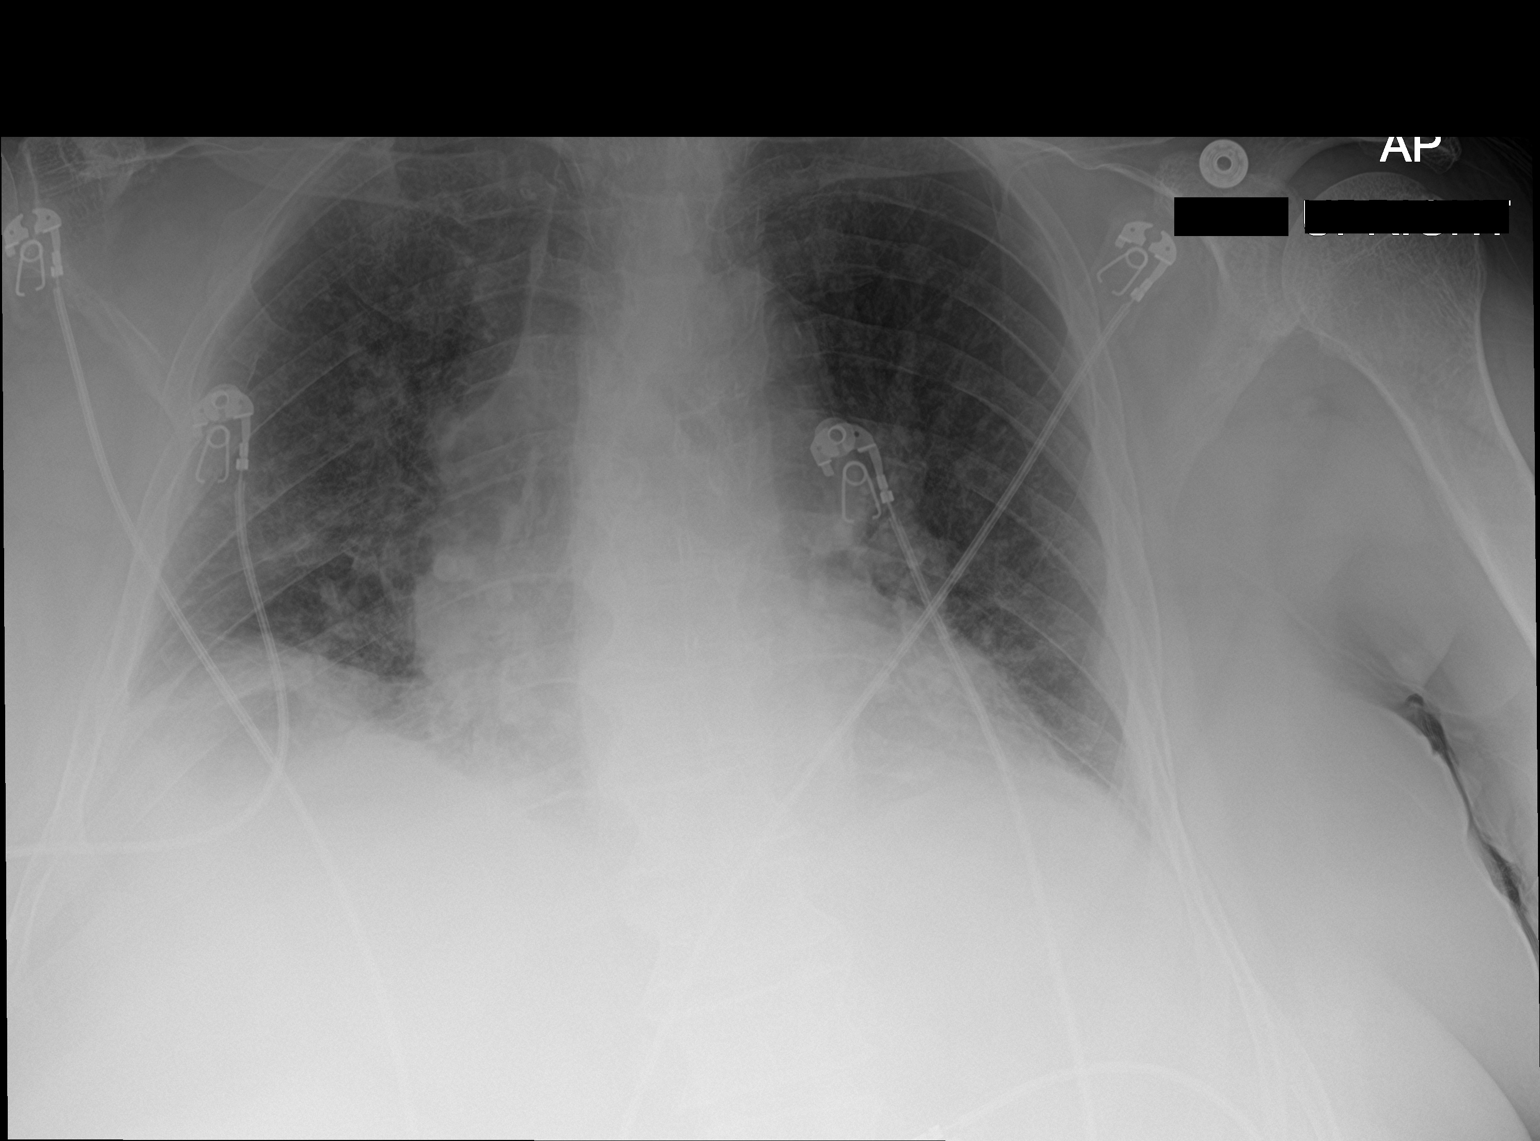

[2 of 2 positions shown; findings below may reference images not displayed]

FINDINGS: Shallow inspiration with atelectasis in the lung bases. Cardiac
enlargement. Pulmonary vascularity is normal for technique. No focal
consolidation. Small bilateral pleural effusions. No pneumothorax.
Calcification of the aorta. Degenerative changes in the spine.
IMPRESSION: Cardiac enlargement. Small bilateral pleural effusions with basilar
atelectasis. Aortic atherosclerosis.

## 2019-12-12 IMAGING — CT CT CERVICAL SPINE W/O CM
5 of 11 series · 9 of 33 positions shown, 10 images · non-contrast
Comparison: CT head 03/03/2017. CT cervical spine 12/17/2016

CLINICAL DATA: Falls over the last 2 days.

EXAM:
CT HEAD WITHOUT CONTRAST
CT CERVICAL SPINE WITHOUT CONTRAST
TECHNIQUE: Multidetector CT imaging of the head and cervical spine was
performed following the standard protocol without intravenous
contrast. Multiplanar CT image reconstructions of the cervical spine
were also generated.

[Series 4: head bone · axial · 0.42mm/px · z∈[-50,+6]mm · 2 of 86 slices shown]
[im 29/86  bone]
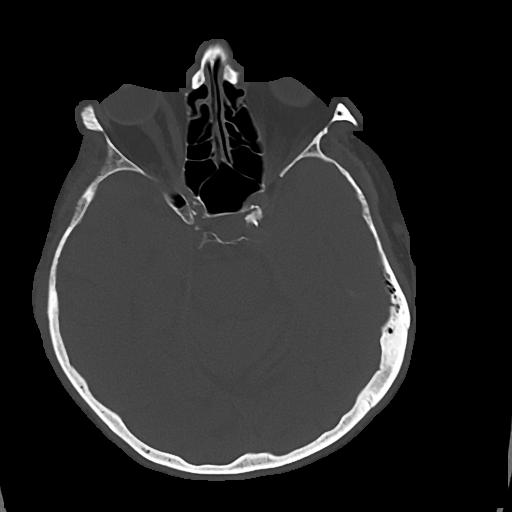
[im 57/86  bone]
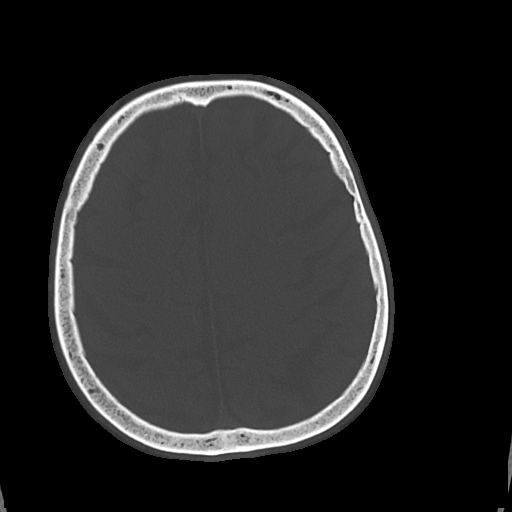

[Series 8: c spine soft · axial · 0.31mm/px · z∈[-183,-133]mm · 2 of 77 slices shown]
[im 26/77  soft-tissue]
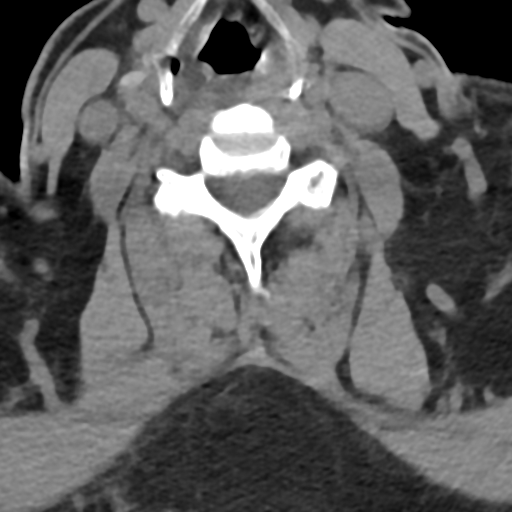
[im 51/77  soft-tissue]
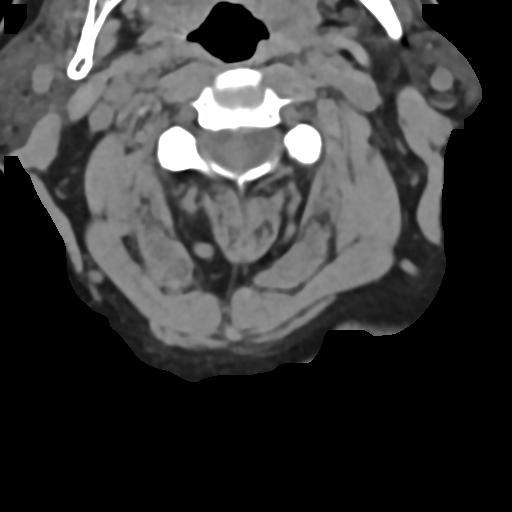

[Series 9: sag bone · sagittal · 0.32mm/px · 2 of 42 slices shown]
[im 14/42  bone]
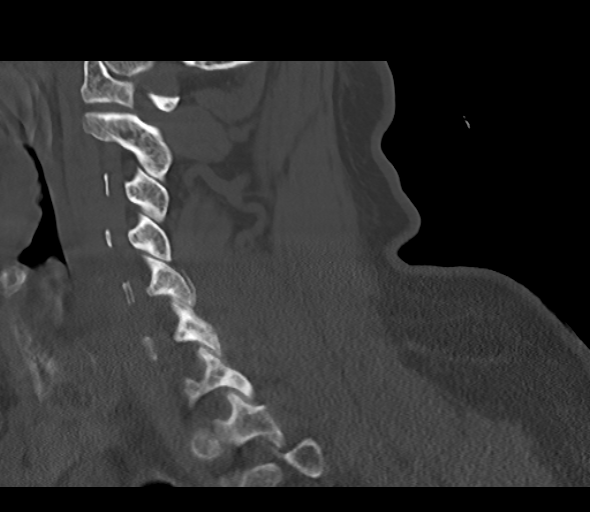
[im 28/42  bone]
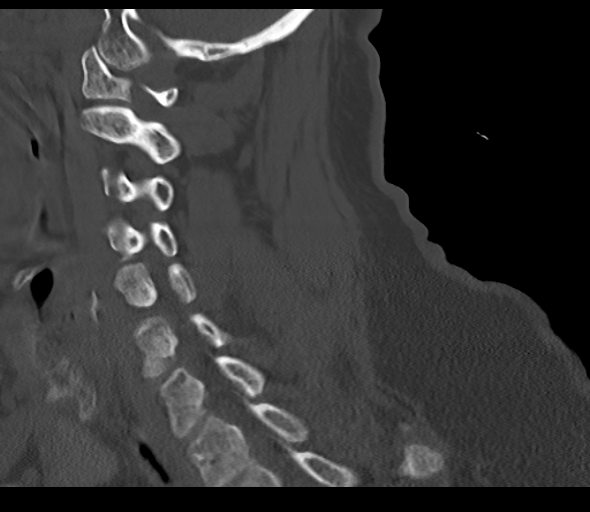

[Series 16: cor bone motion repeat · coronal · 0.19mm/px · 1 of 48 slices shown]
[im 24/48  bone]
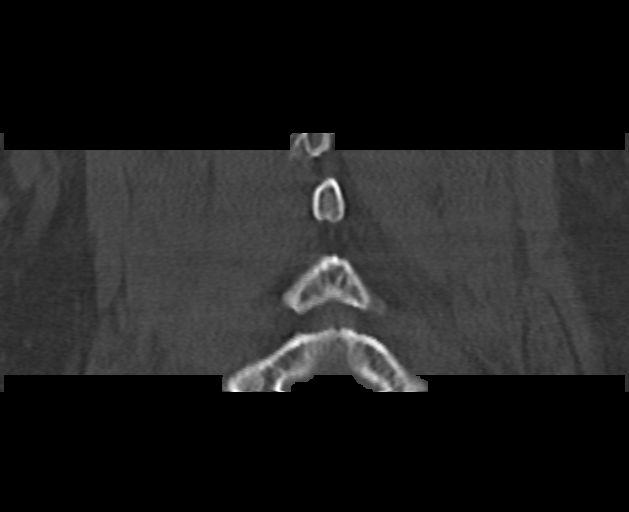

[Series 17: orthogonal axials · axial · 0.21mm/px · z∈[-199,-147]mm · 2 of 85 slices shown, 3 images]
[im 29/85  soft-tissue]
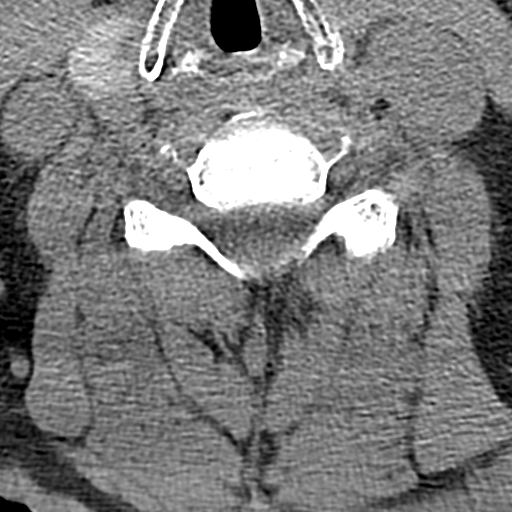
[im 29/85  bone]
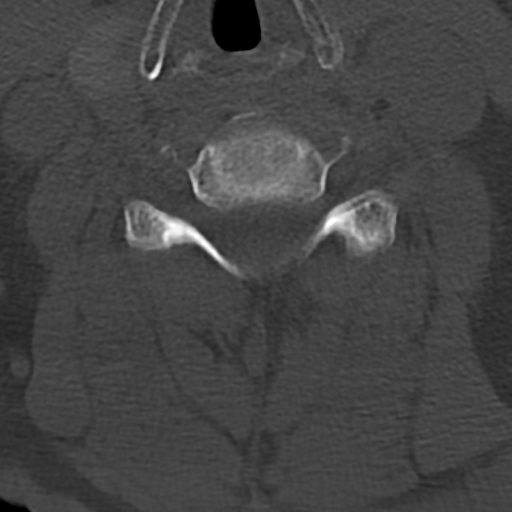
[im 57/85  bone]
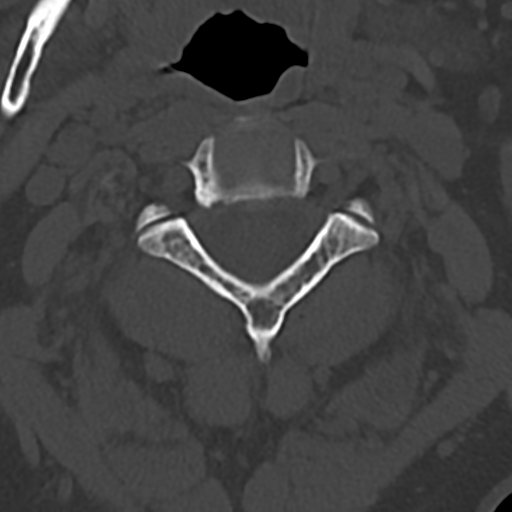

[9 of 33 positions shown; findings below may reference images not displayed]

FINDINGS: CT HEAD FINDINGS

Brain: Diffuse cerebral atrophy. Mild ventricular dilatation
consistent with central atrophy. Low-attenuation changes throughout
the deep white matter consistent small vessel ischemia. No
mass-effect or midline shift. No abnormal extra-axial fluid
collections. Gray-white matter junctions are distinct. Basal
cisterns are not effaced. No acute intracranial hemorrhage.

Vascular: Intracranial arterial vascular calcifications are present.

Skull: Calvarium appears intact.

Sinuses/Orbits: Paranasal sinuses and mastoid air cells are clear.

Other: None.

CT CERVICAL SPINE FINDINGS

Alignment: Normal alignment of the cervical spine.

Skull base and vertebrae: Skull base appears intact. No vertebral
compression deformities. No focal bone lesion or bone destruction.
Old appearing ununited fracture of the T1 spinous process.

Soft tissues and spinal canal: No prevertebral soft tissue swelling.
No paraspinal soft tissue mass or infiltration.

Disc levels: Mild degenerative changes demonstrated at C5-6 level.
Degenerative changes also at C1-2. degenerative changes in the facet
joints.

Upper chest: Scarring suggested in the lung apices.

Other: None.
IMPRESSION: 1. No acute intracranial abnormalities. Chronic atrophy and small
vessel ischemic changes.
2. Normal alignment of the cervical spine. Mild degenerative
changes. Old appearing ununited fracture of the T1 spinous process.
No acute displaced fractures identified.

## 2019-12-23 IMAGING — CR DG HIP (WITH OR WITHOUT PELVIS) 2-3V*L*
3 series · 3 of 3 positions shown · non-contrast
Comparison: None.

CLINICAL DATA: Fell 4 days ago with left hip pain.

EXAM:
DG HIP (WITH OR WITHOUT PELVIS) 2-3V LEFT

[pelvis ap]
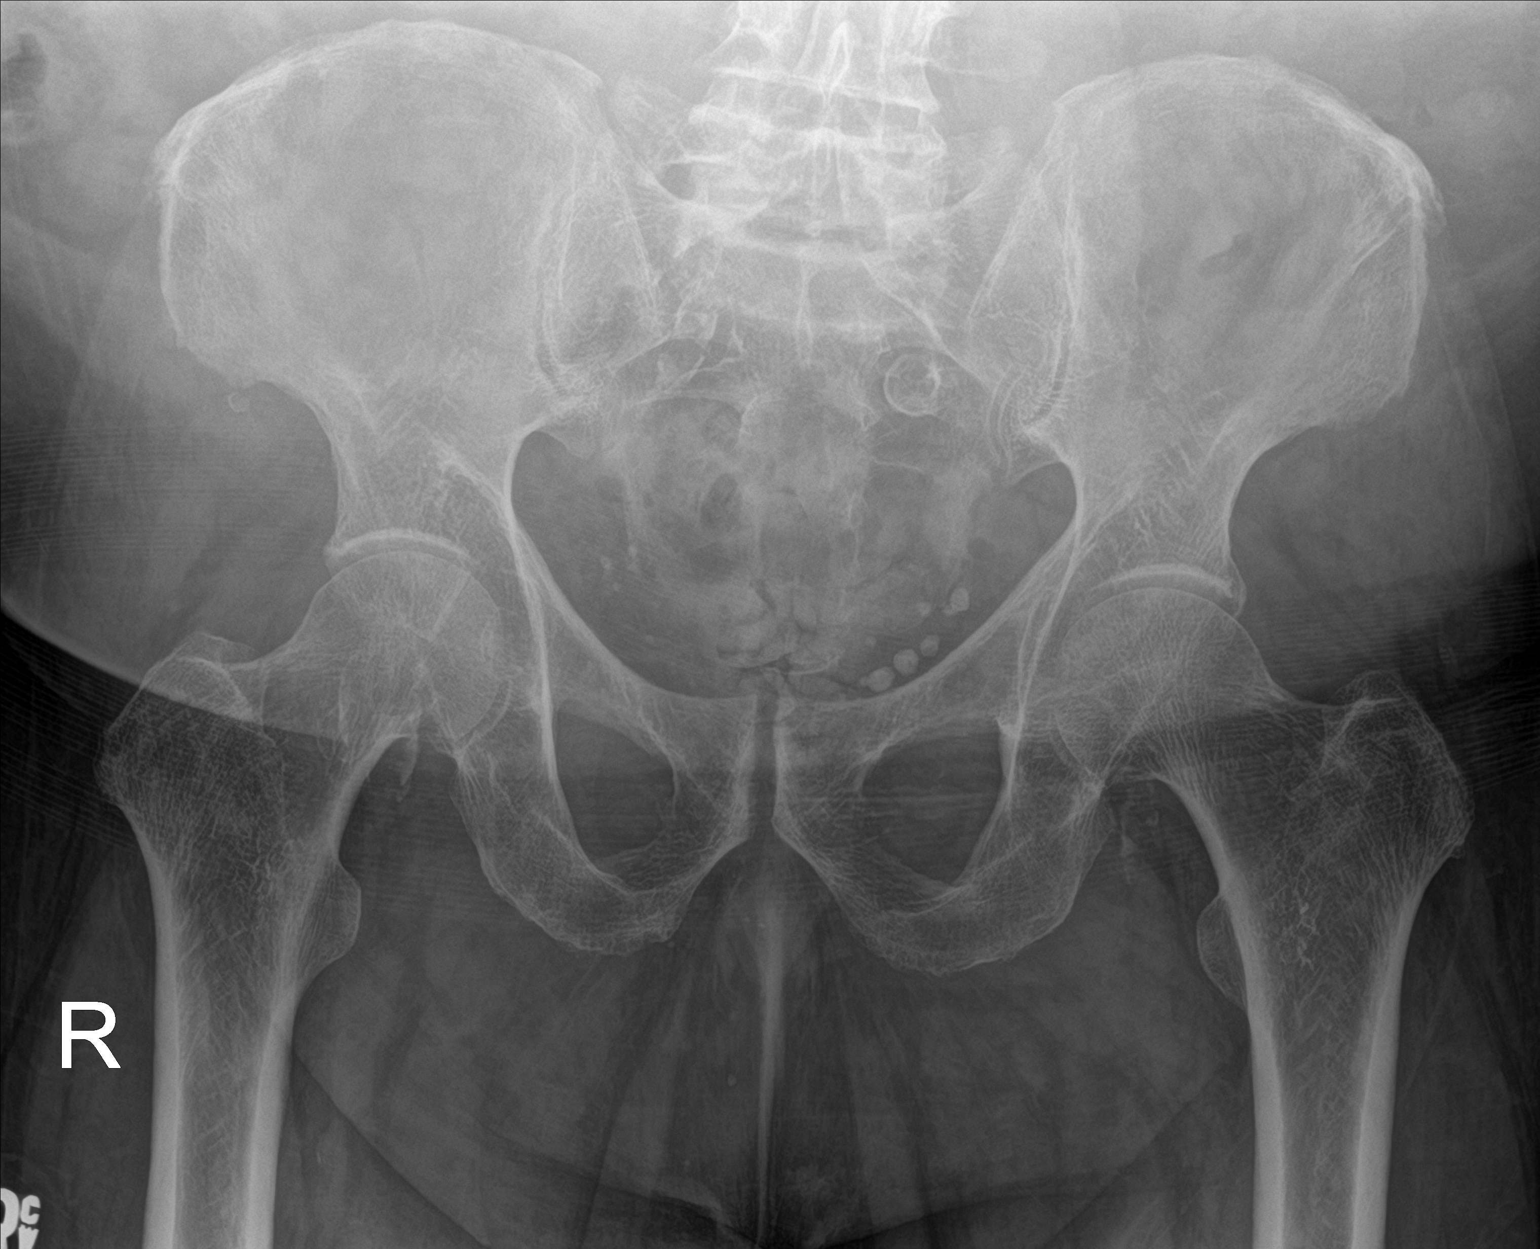

[hip ap]
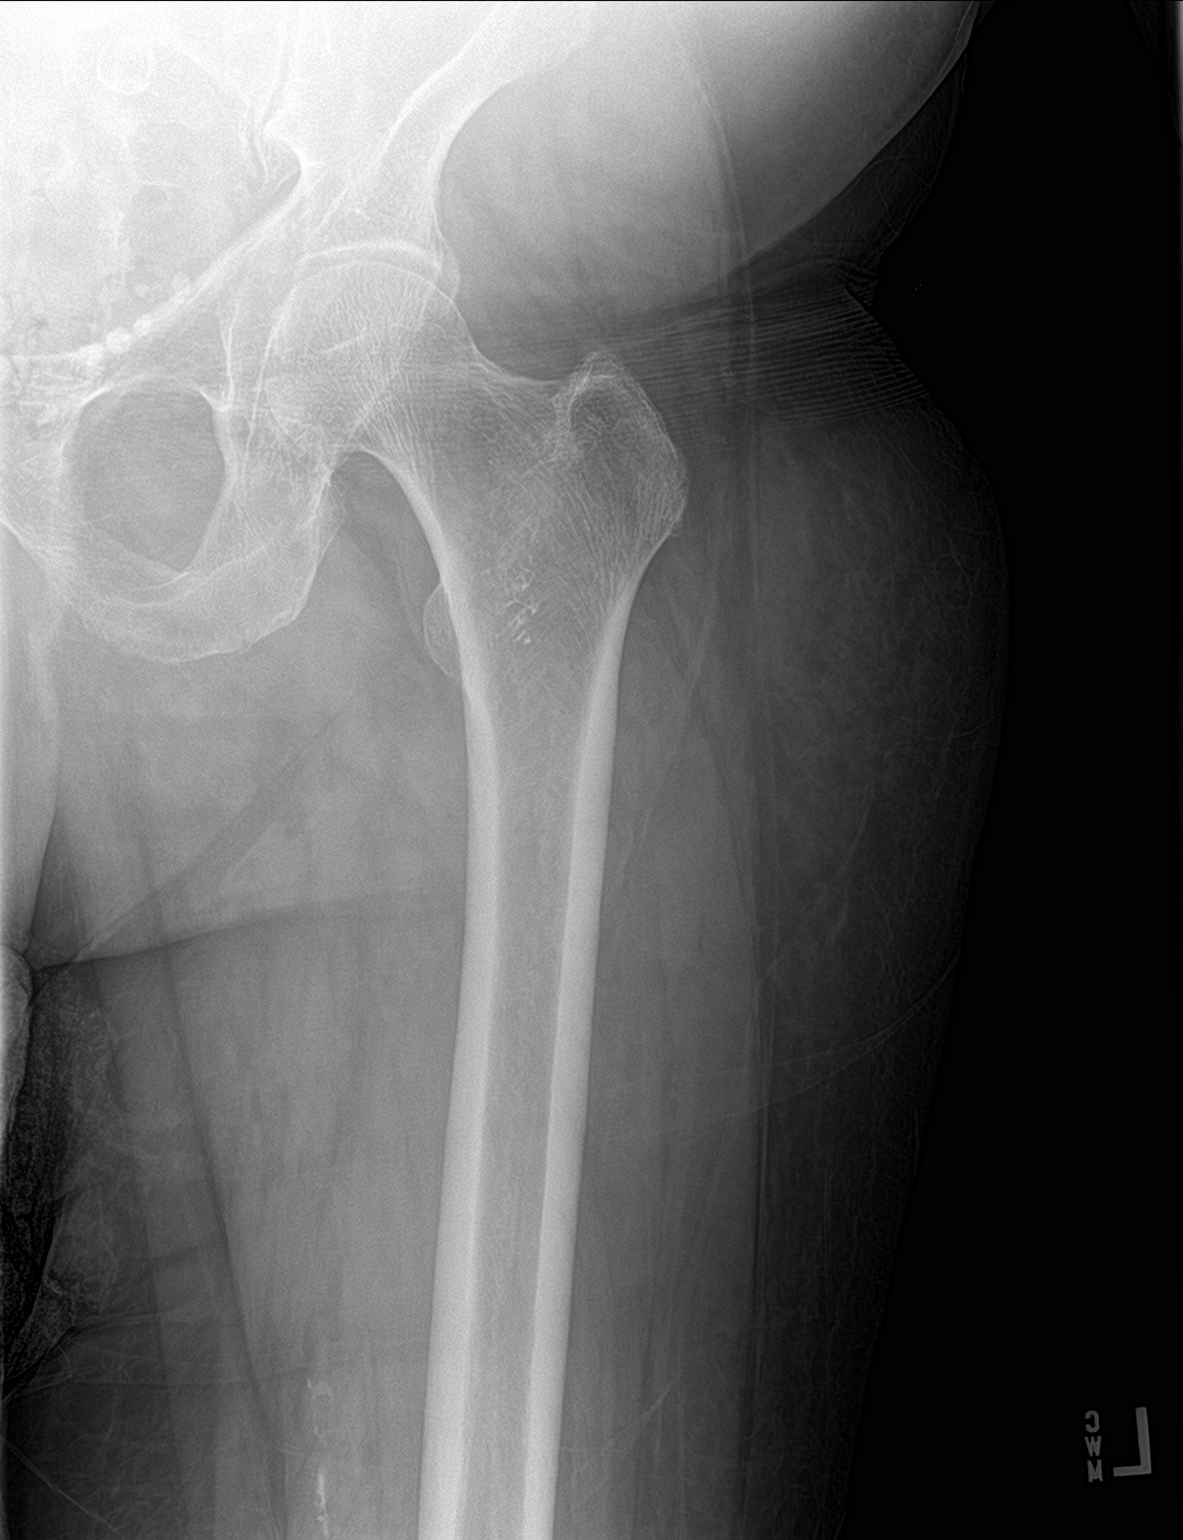

[hip lat]
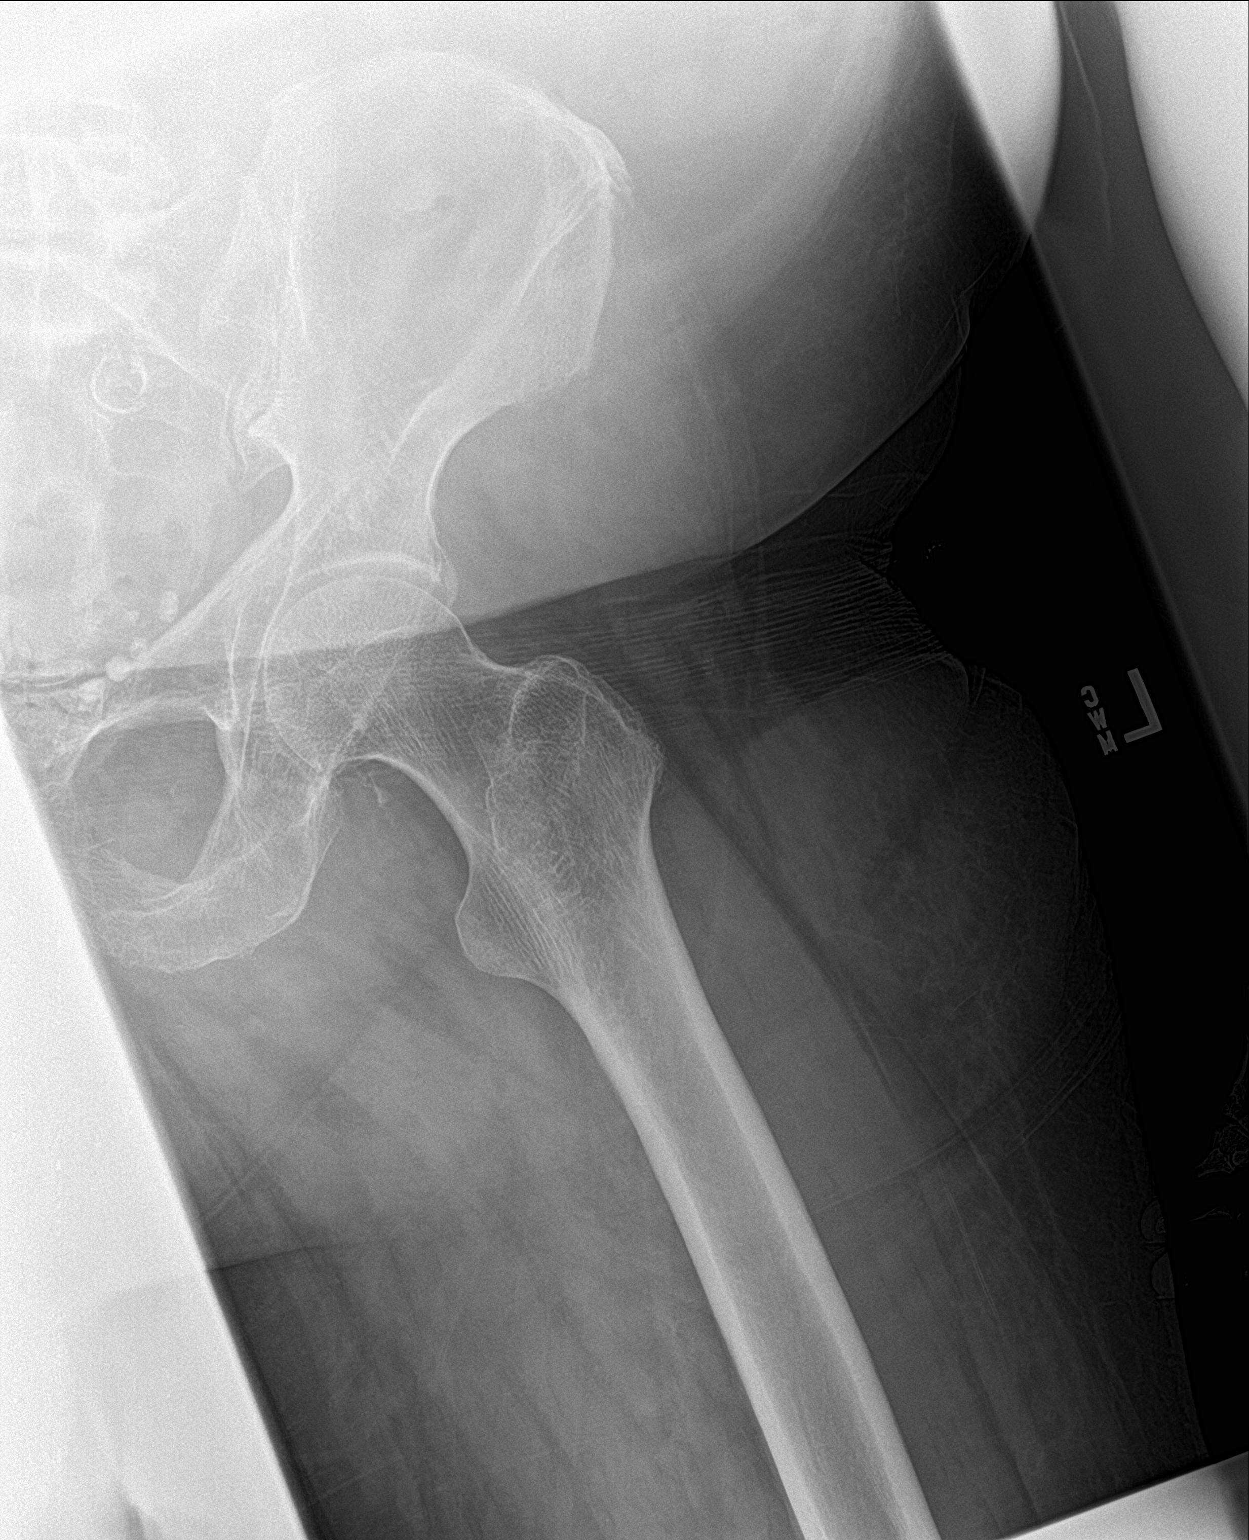

[3 of 3 positions shown; findings below may reference images not displayed]

FINDINGS: There is no evidence of hip fracture or dislocation. There is no
evidence of arthropathy or other focal bone abnormality.
IMPRESSION: Negative.

## 2020-02-29 IMAGING — CR DG CHEST 2V
2 series · 2 of 2 positions shown · non-contrast
Comparison: June 16, 2017

CLINICAL DATA: Generalized weakness.  Hypertension.

EXAM:
CHEST - 2 VIEW

[w chest lat]
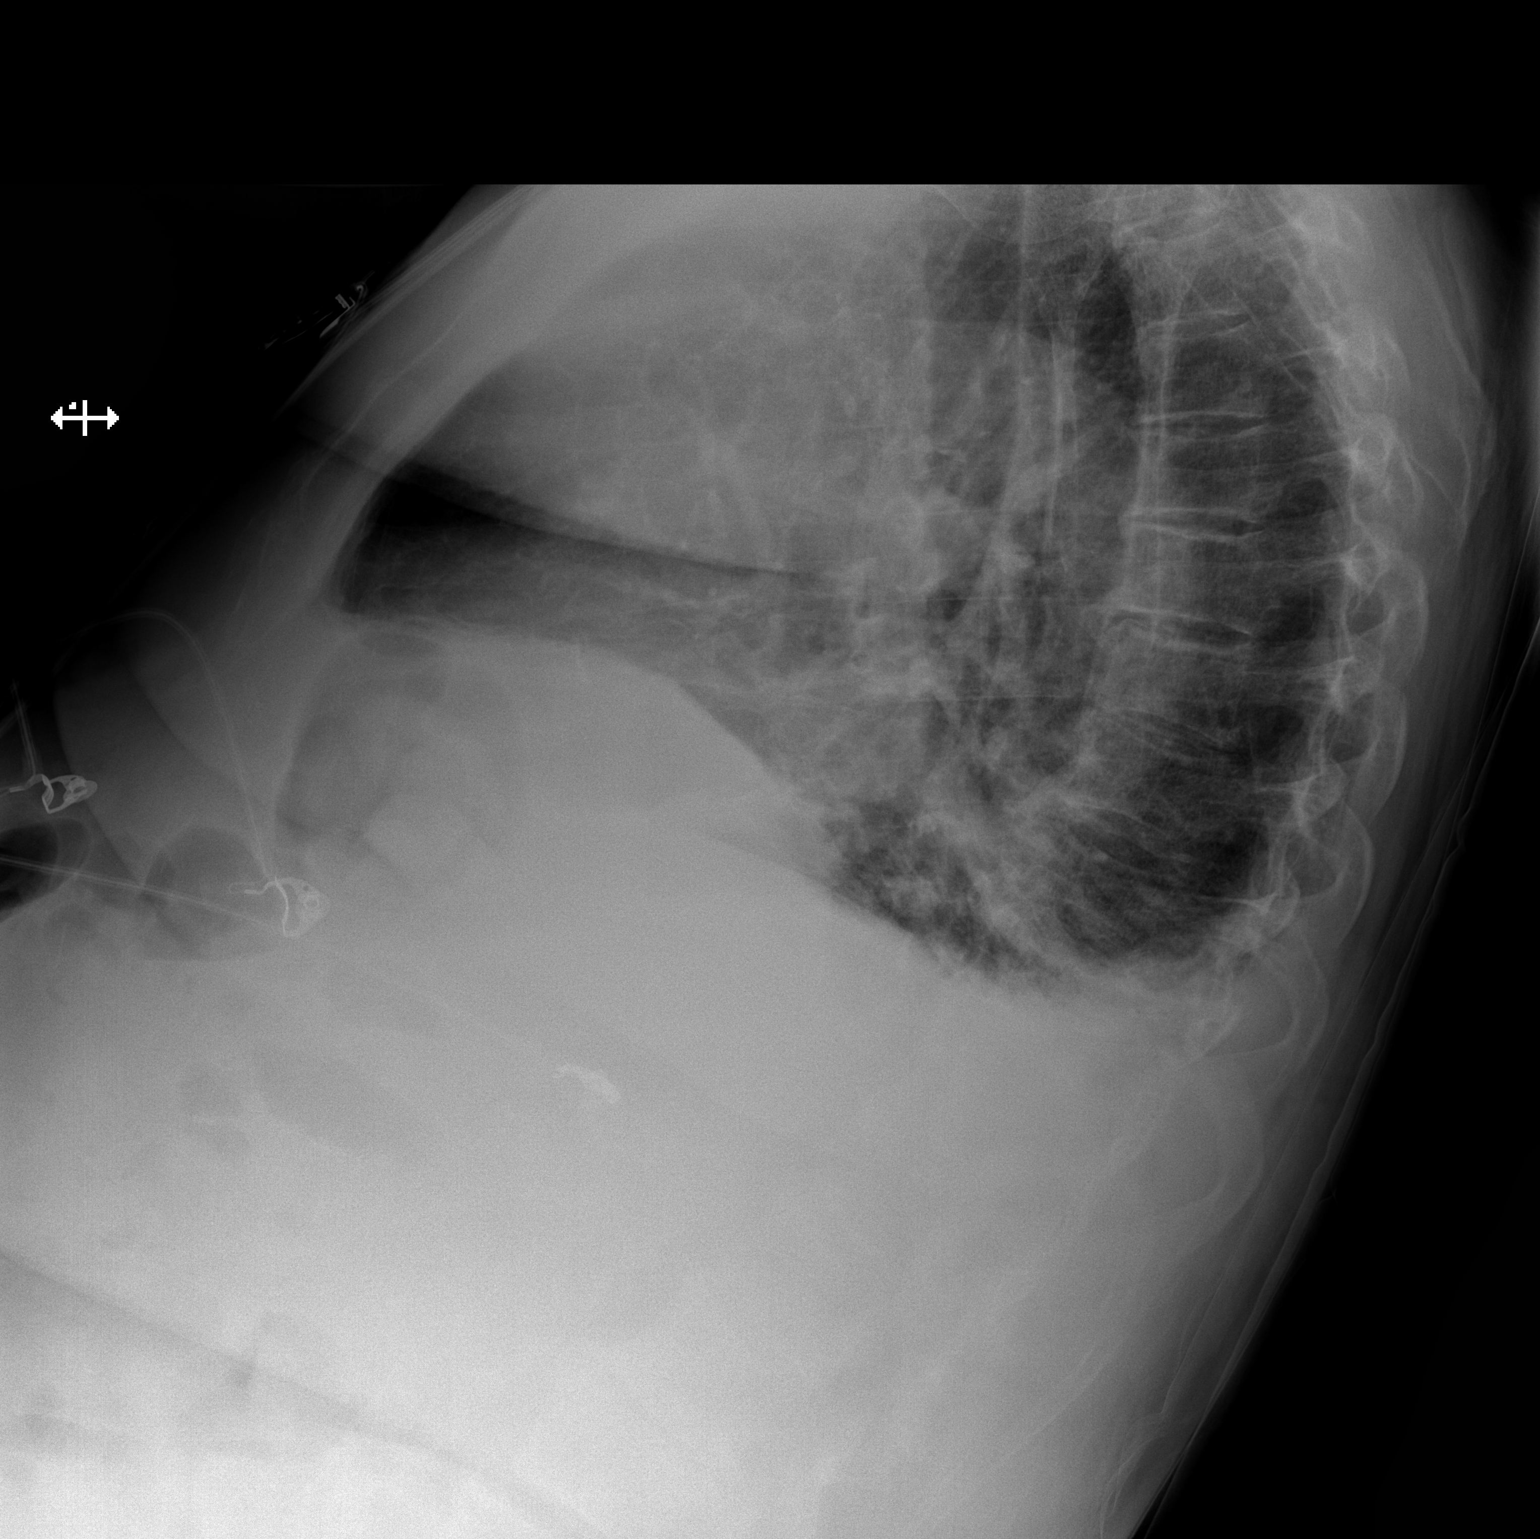

[x chest ap]
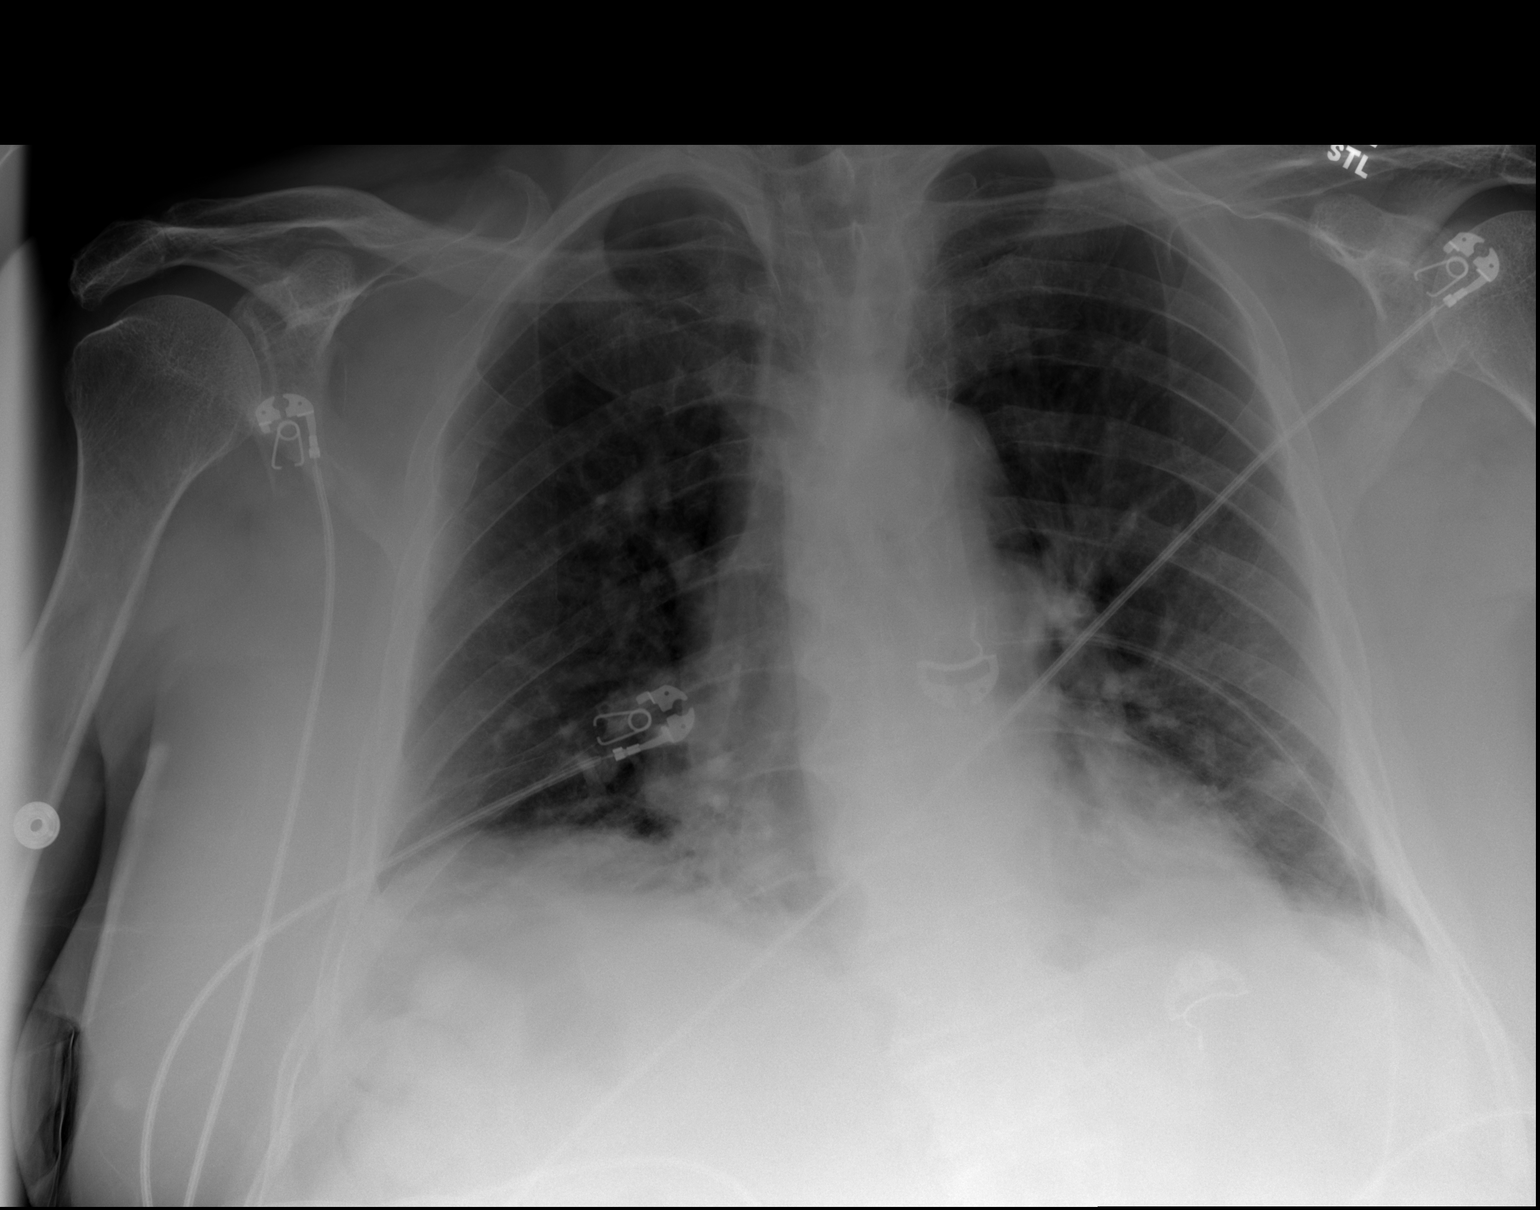

[2 of 2 positions shown; findings below may reference images not displayed]

FINDINGS: There is a small pleural effusion on each side with bibasilar
atelectatic change. There is no frank edema or consolidation. Heart
is upper normal in size with pulmonary vascularity normal. No
adenopathy. There is thoracolumbar levoscoliosis.
IMPRESSION: Small pleural effusions with bibasilar atelectasis. No frank
consolidation. Stable cardiac silhouette.

## 2020-02-29 IMAGING — CT CT HEAD W/O CM
3 series · 16 of 47 positions shown, 19 images · non-contrast
Comparison: Head CT dated 06/16/2017

CLINICAL DATA: 67-year-old male with altered mental status.

EXAM:
CT HEAD WITHOUT CONTRAST
TECHNIQUE: Contiguous axial images were obtained from the base of the skull
through the vertex without intravenous contrast.

[Series 2: head wo · axial · 0.41mm/px · z∈[-213,-68]mm · 10 of 35 slices shown, 13 images]
[im 3/35  brain]
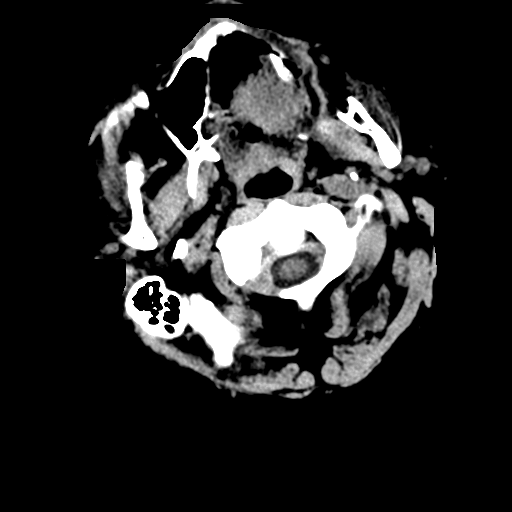
[im 3/35  bone]
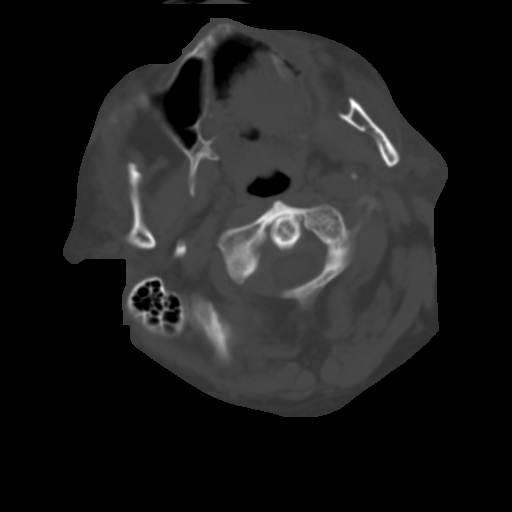
[im 6/35  brain]
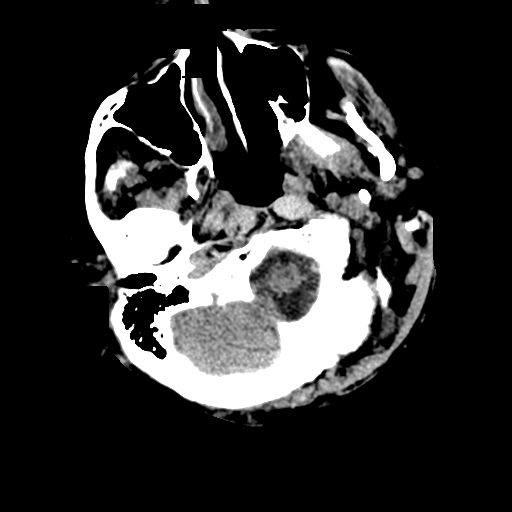
[im 10/35  brain]
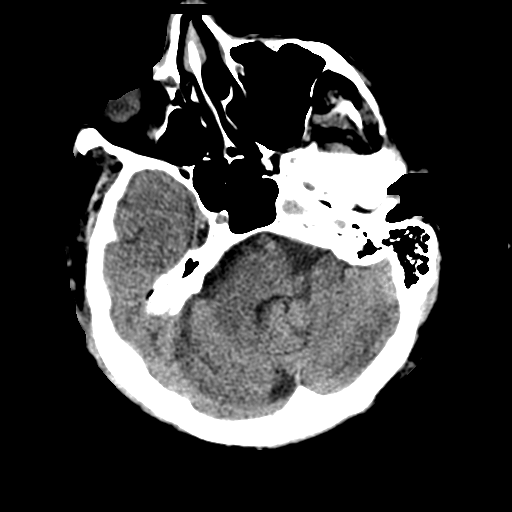
[im 12/35  brain]
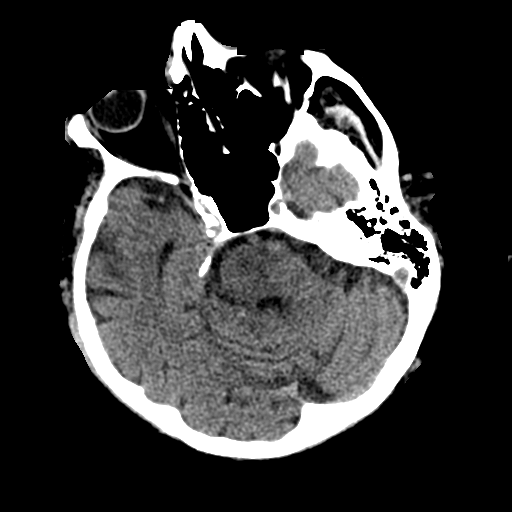
[im 16/35  brain]
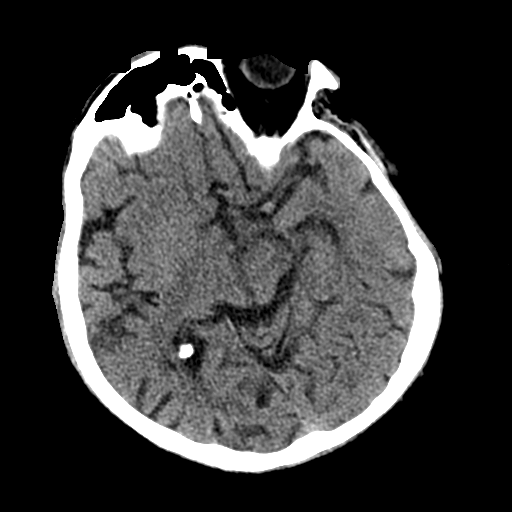
[im 16/35  bone]
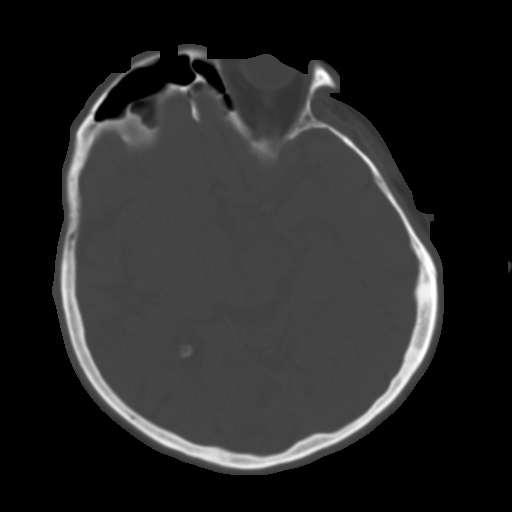
[im 19/35  brain]
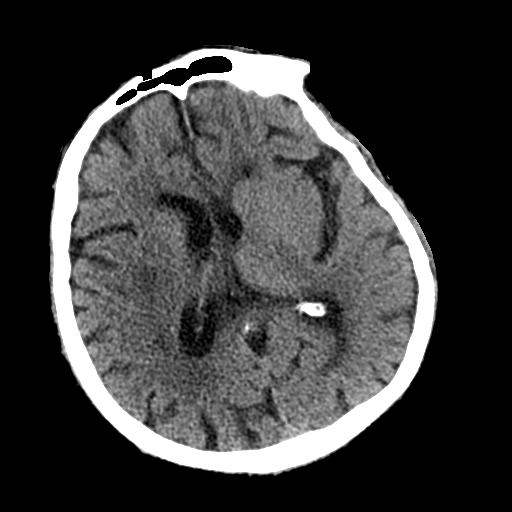
[im 23/35  brain]
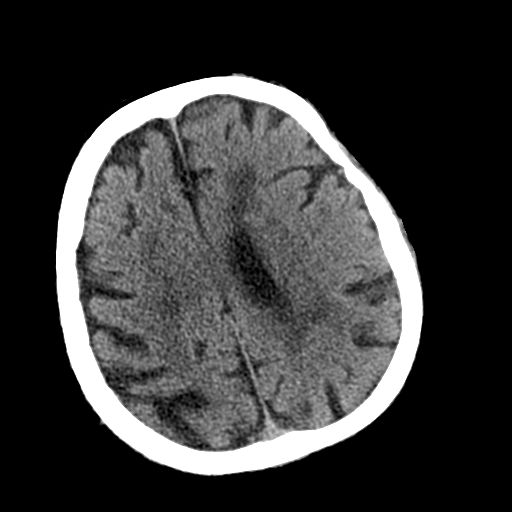
[im 26/35  brain]
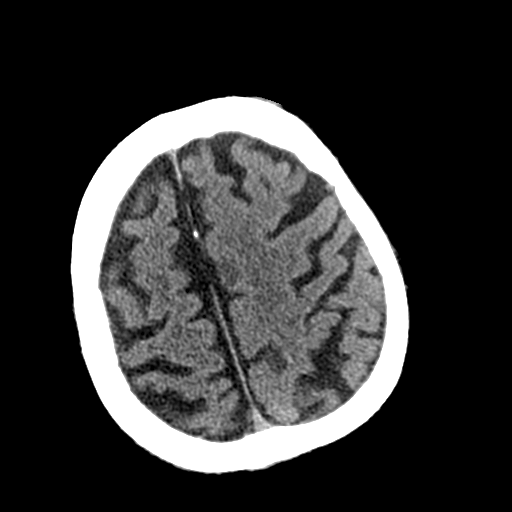
[im 29/35  brain]
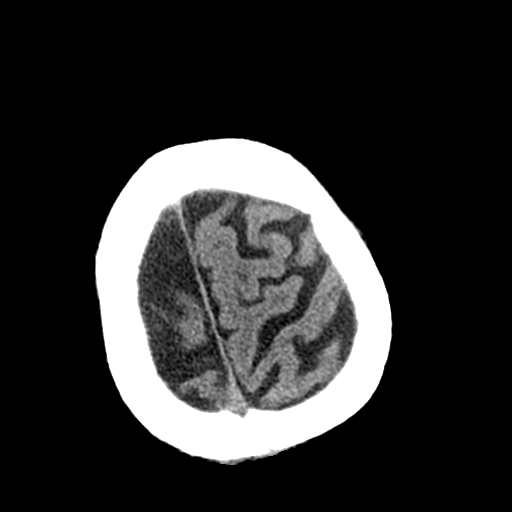
[im 29/35  bone]
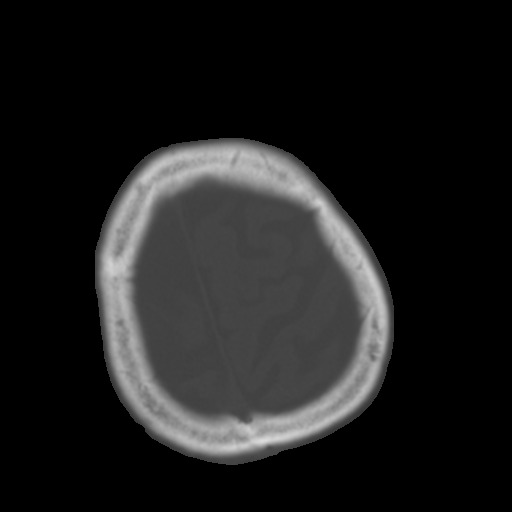
[im 32/35  brain]
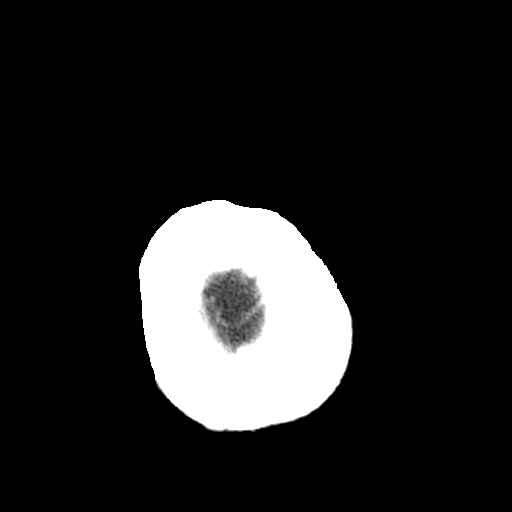

[Series 5: coronal soft tissue · coronal · 0.37mm/px · 3 of 72 slices shown]
[im 24/72  brain]
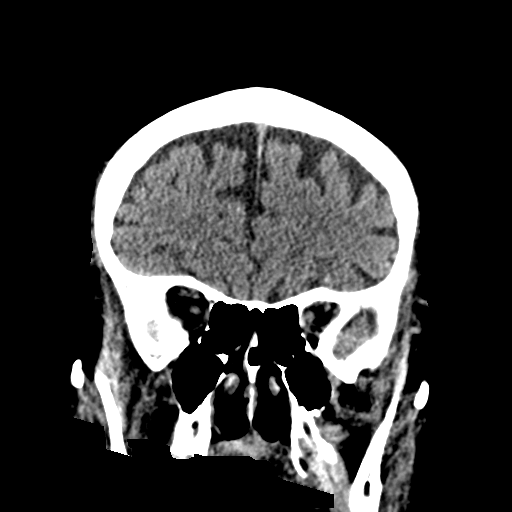
[im 32/72  brain]
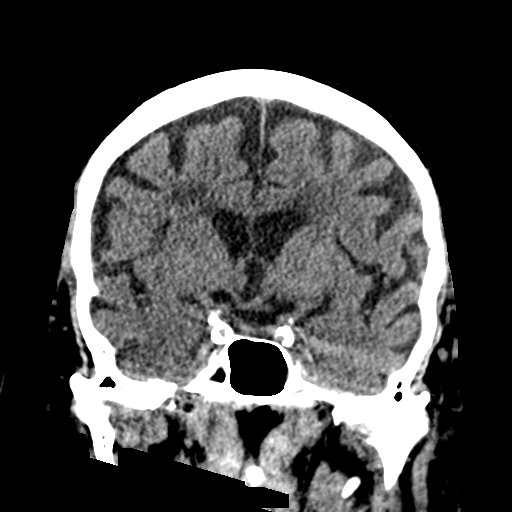
[im 40/72  brain]
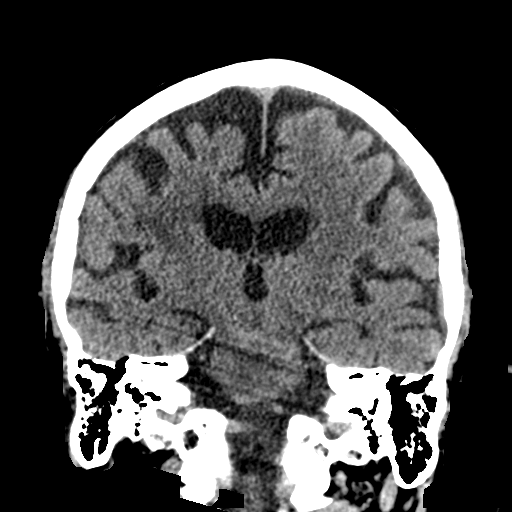

[Series 6: sagittal soft tissue · sagittal · 0.37mm/px · 3 of 63 slices shown]
[im 28/63  brain]
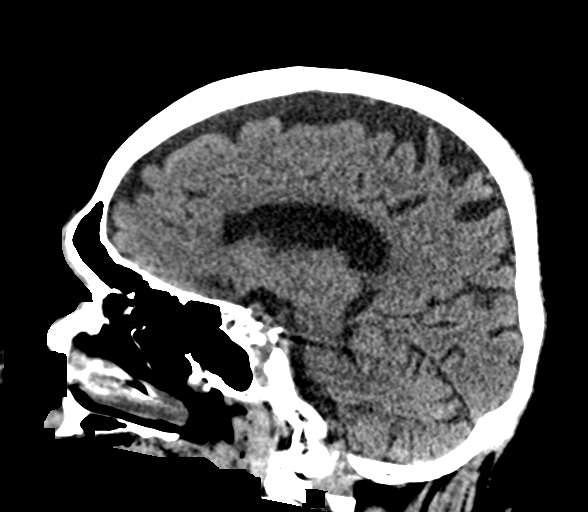
[im 32/63  brain]
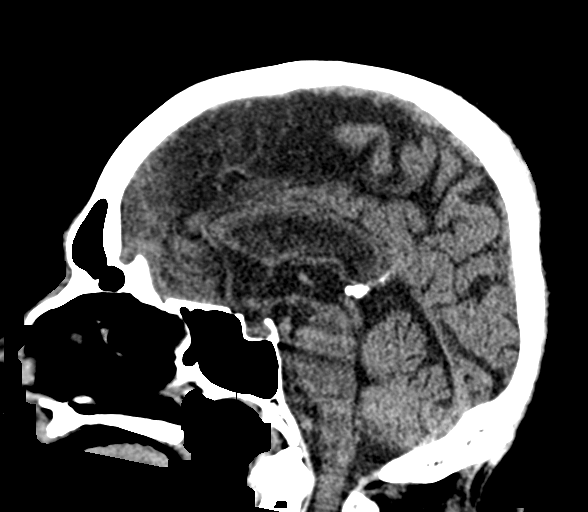
[im 35/63  brain]
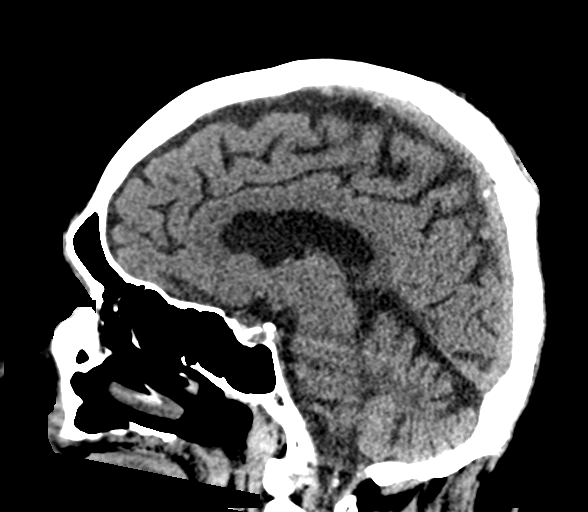

[16 of 47 positions shown; findings below may reference images not displayed]

FINDINGS: Brain: There is mild age-related atrophy and chronic microvascular
ischemic changes. There is no acute intracranial hemorrhage. No mass
effect or midline shift noted. No extra-axial fluid collection.

Vascular: No hyperdense vessel or unexpected calcification.

Skull: Normal. Negative for fracture or focal lesion.

Sinuses/Orbits: No acute finding.

Other: None
IMPRESSION: 1. No acute intracranial pathology.
2. Age-related atrophy and chronic microvascular ischemic changes.

## 2020-03-14 IMAGING — CT CT CERVICAL SPINE W/O CM
5 of 8 series · 12 of 33 positions shown, 13 images · non-contrast
Comparison: Head CT - 09/03/2017; head and C-spine CT-06/16/2017

CLINICAL DATA: Patient fell last evening, now with lethargy.
History of stroke.

EXAM:
CT HEAD WITHOUT CONTRAST
CT CERVICAL SPINE WITHOUT CONTRAST
TECHNIQUE: Multidetector CT imaging of the head and cervical spine was
performed following the standard protocol without intravenous
contrast. Multiplanar CT image reconstructions of the cervical spine
were also generated.

[Series 4: head bone · axial · 0.42mm/px · z∈[-150,-96]mm · 2 of 83 slices shown]
[im 28/83  bone]
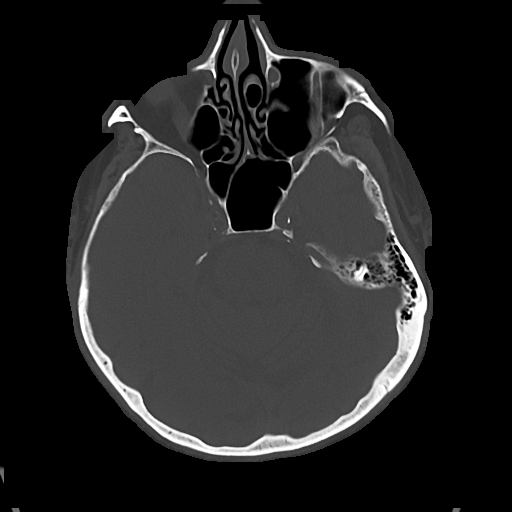
[im 55/83  bone]
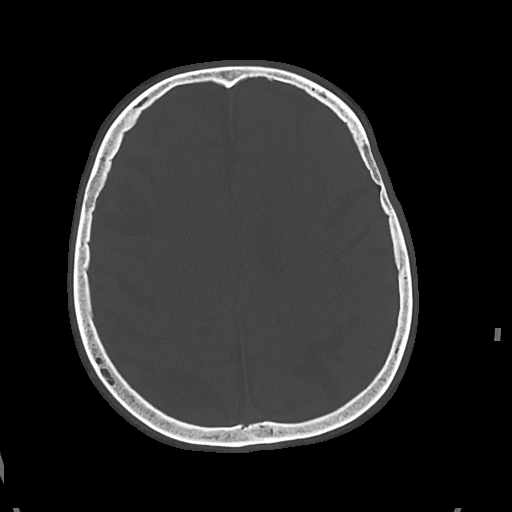

[Series 9: c spine soft · axial · 0.26mm/px · z∈[-297,-195]mm · 3 of 103 slices shown]
[im 26/103  soft-tissue]
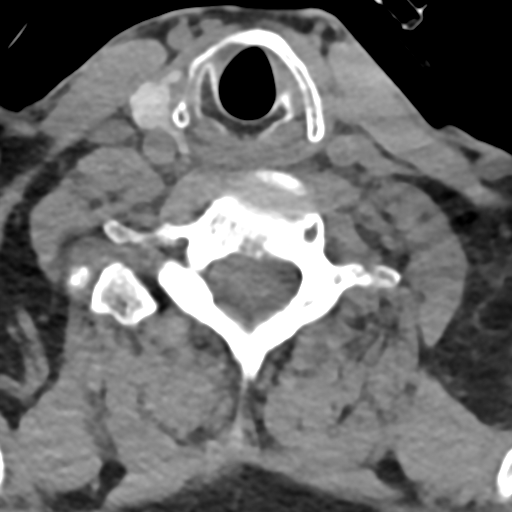
[im 52/103  soft-tissue]
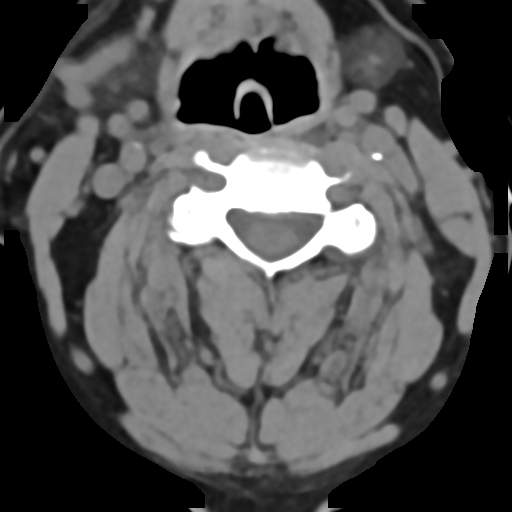
[im 77/103  soft-tissue]
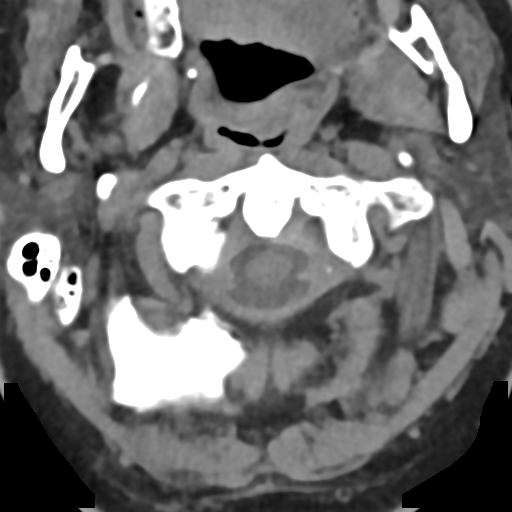

[Series 10: sag bone · sagittal · 0.36mm/px · 4 of 45 slices shown]
[im 9/45  bone]
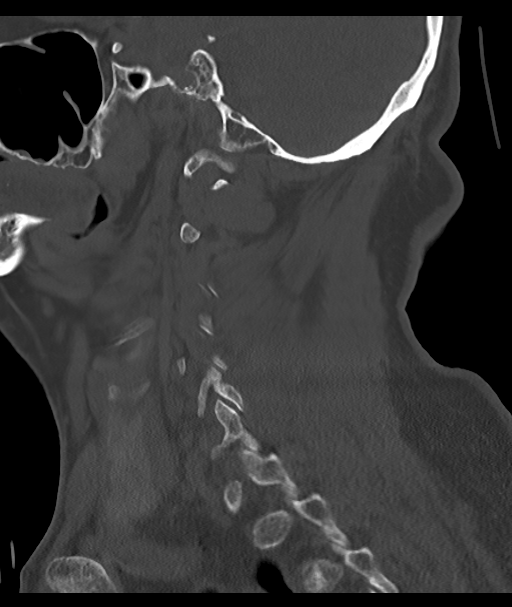
[im 18/45  bone]
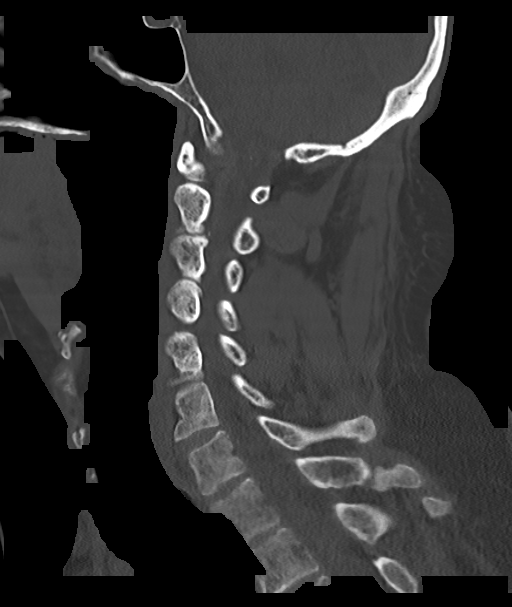
[im 27/45  bone]
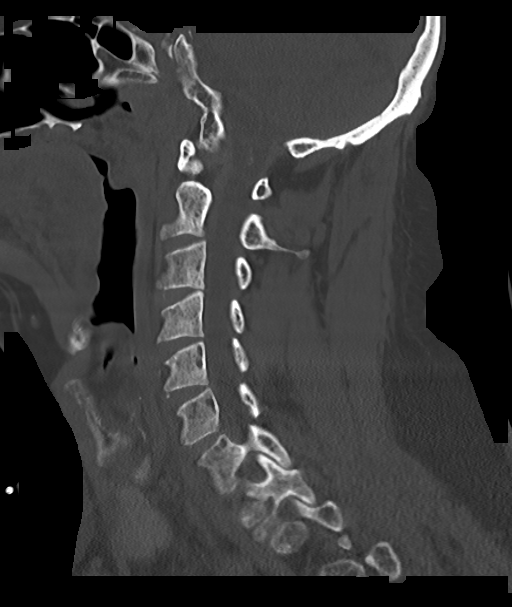
[im 36/45  bone]
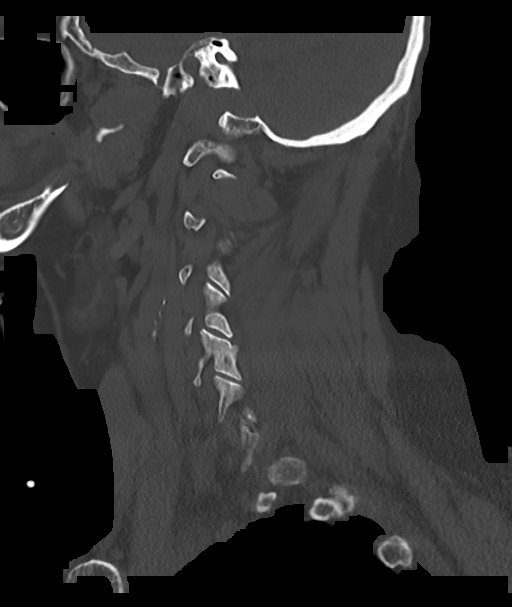

[Series 11: cor bone · coronal · 0.35mm/px · 1 of 53 slices shown]
[im 27/53  bone]
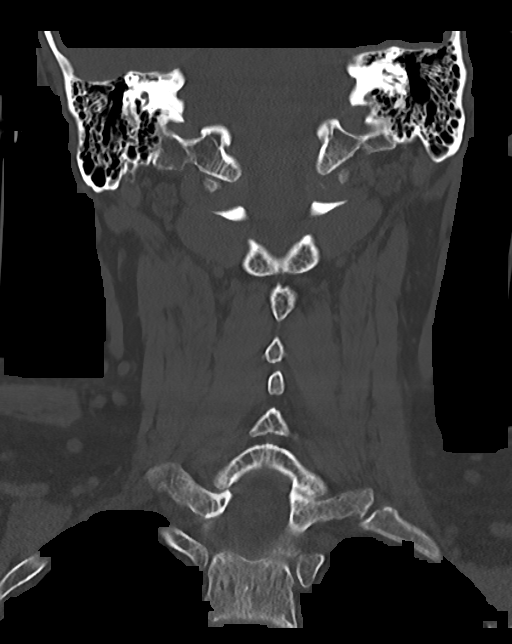

[Series 12: orthogonal axials · axial · 0.21mm/px · z∈[-300,-238]mm · 2 of 95 slices shown, 3 images]
[im 32/95  soft-tissue]
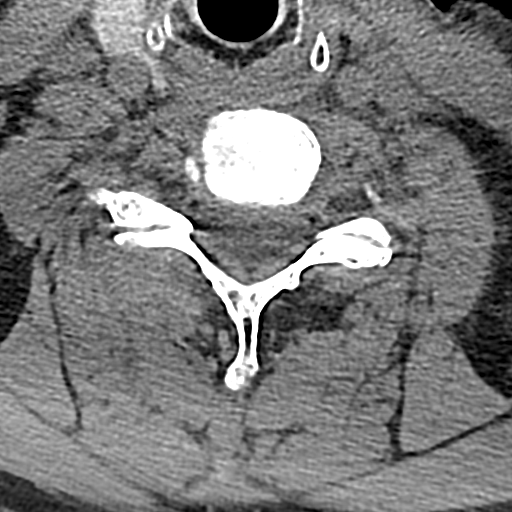
[im 32/95  bone]
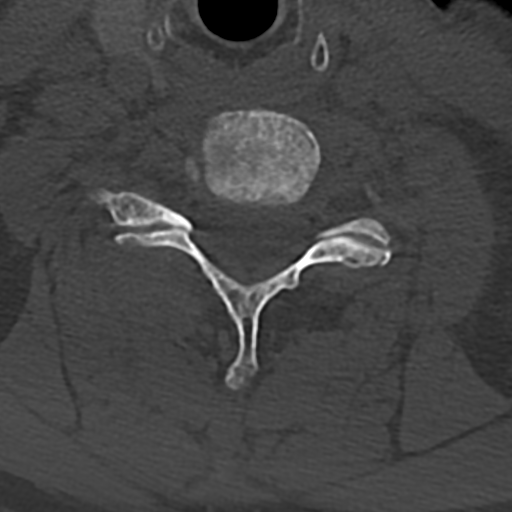
[im 63/95  bone]
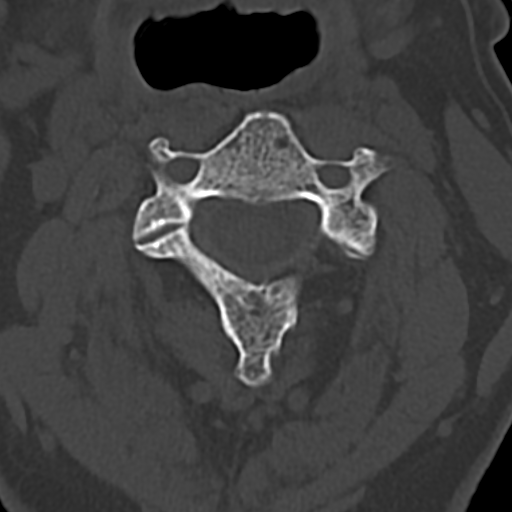

[12 of 33 positions shown; findings below may reference images not displayed]

FINDINGS: CT HEAD FINDINGS

Brain: Moderately advanced atrophy with sulcal prominence and
centralized volume loss with commensurate ex vacuo dilatation the
ventricular system. Periventricular hypodensities compatible
microvascular ischemic disease. Given background parenchymal
abnormalities, there is no CT evidence superimposed acute large
territory infarct. No intraparenchymal or extra-axial mass or
hemorrhage. Unchanged size and configuration of the ventricles and
the basilar cisterns. No midline shift.

Vascular: Intracranial atherosclerosis.

Skull: No displaced calvarial fracture.

Sinuses/Orbits: Limited visualization of the paranasal sinuses and
mastoid air cells is normal. No air-fluid levels. A small amount of
debris is seen within the bilateral external auditory canals. Post
left-sided cataract surgery.

Other: Regional soft tissues appear normal.

CT CERVICAL SPINE FINDINGS

Alignment: C1 to the superior endplate of T2 is imaged. Mild
scoliotic curvature of the cervicothoracic spine, potentially
positional. Otherwise, normal alignment of the cervical spine. No
anterolisthesis or retrolisthesis. Bilateral facets are normally
aligned.

Skull base and vertebrae: The dens is normally positioned between
the lateral masses of C1. Mild degenerative change of the
atlantodental articulation. Normal atlantoaxial articulations.

Cervical vertebral body heights are preserved.

Old fracture involving the T1 spinous process (sagittal image 18,
series 10)

Soft tissues and spinal canal: Prevertebral soft tissues are normal.

Disc levels: Cervical intervertebral disc space heights appear
preserved.

Upper chest: Limited visualization of lung apices demonstrates
centrilobular emphysematous change.

Other: Regional soft tissues appear normal. Calcified
atherosclerotic plaque within the bilateral carotid bulbs. Normal
noncontrast appearance of the thyroid gland. No bulky cervical
lymphadenopathy on this noncontrast examination.
IMPRESSION: 1. Similar findings of atrophy and microvascular ischemic disease
without superimposed acute intracranial process.
2. No acute fracture or static subluxation of the cervical spine.
3. Old T1 spinous process fracture.

## 2020-03-14 IMAGING — DX DG CHEST 1V PORT
1 series · 1 of 1 positions shown · non-contrast
Comparison: 09/03/2017

CLINICAL DATA: Hypoxemia; Per ED notes, last night around 9-10p pt
fell and denies LOC; Hx CVA with R sided deficits; pt lethargic and
weak; Hx COPD, sarcoidosis, DM, and HTN. Smoker

EXAM:
PORTABLE CHEST 1 VIEW

[chest]
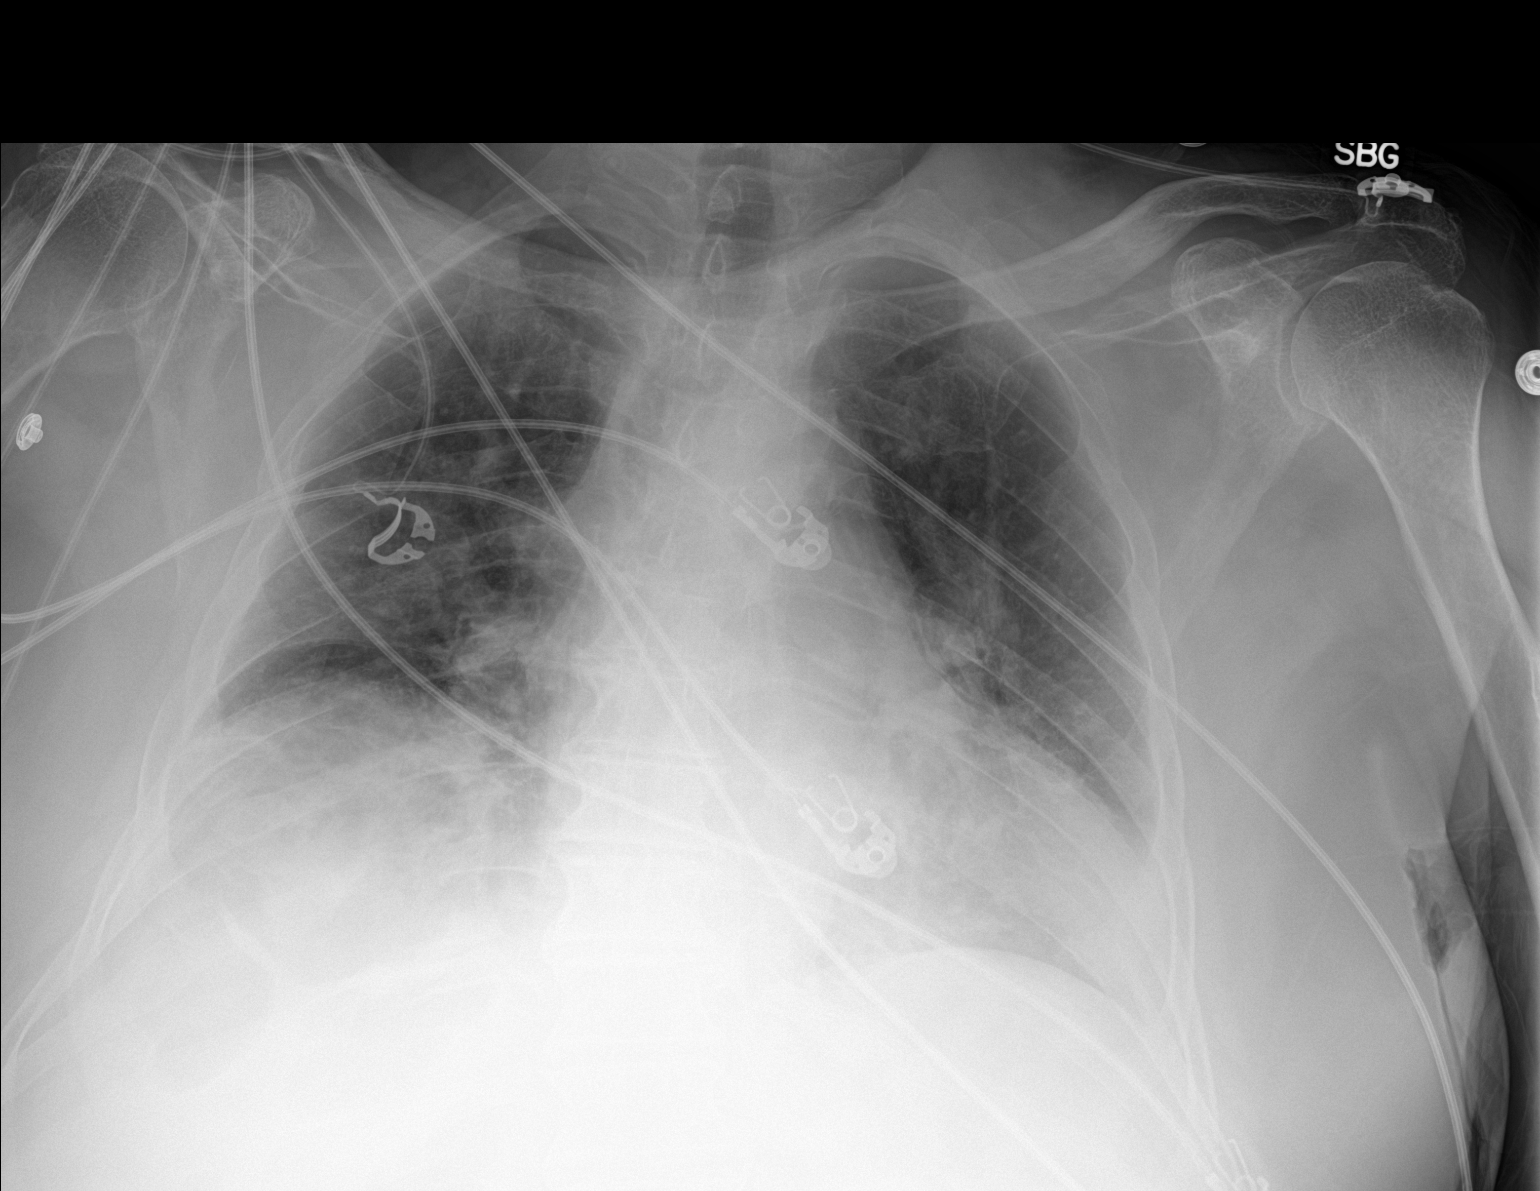

[1 of 1 positions shown; findings below may reference images not displayed]

FINDINGS: Shallow lung inflation. Heart size is accentuated by portable AP
technique. There is stable minimal atelectasis or scarring at the
RIGHT lung base. No pulmonary edema. Degenerative changes are seen
in thoracic spine. No acute fracture.
IMPRESSION: RIGHT LOWER lobe atelectasis.

## 2020-04-07 IMAGING — DX DG CHEST 1V PORT
1 series · 1 of 1 positions shown · non-contrast
Comparison: 09/17/2017 and earlier chest radiographs.

CLINICAL DATA: 67-year-old male with hypotension. Bilateral lower
extremity edema.

EXAM:
PORTABLE CHEST 1 VIEW

[chest ap]
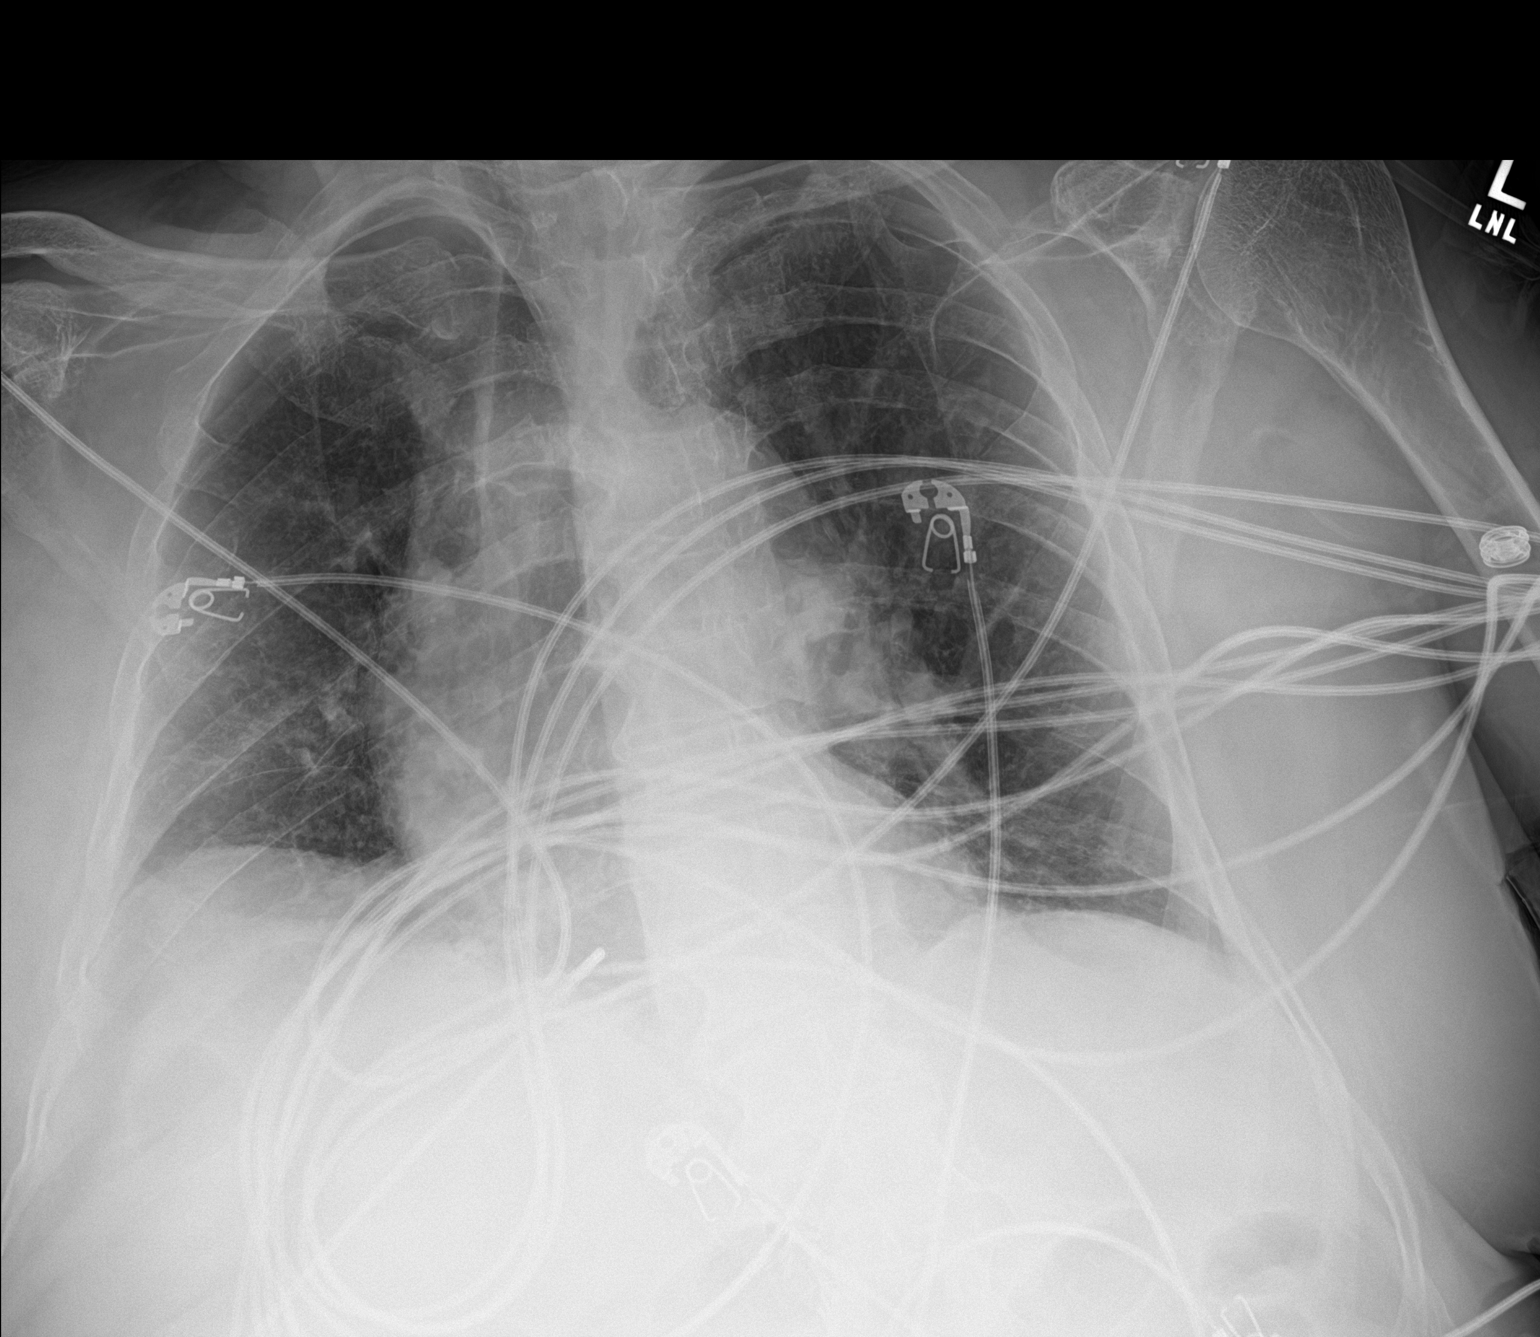

[1 of 1 positions shown; findings below may reference images not displayed]

FINDINGS: Portable AP semi upright view at 7060 hours. The patient is more
rotated to the right. Mildly improved lung volumes and bibasilar
ventilation. No pulmonary edema, pneumothorax or consolidation. No
definite pleural effusion. Stable cardiac size and mediastinal
contours. Cardiac size within normal limits. Visualized tracheal air
column is within normal limits. Negative visible bowel gas pattern.
No acute osseous abnormality identified.
IMPRESSION: No acute cardiopulmonary abnormality.

## 2020-04-10 IMAGING — DX DG CHEST 1V PORT
1 series · 1 of 1 positions shown · non-contrast
Comparison: Chest radiograph dated 10/11/2017 and CT dated
10/11/2017

CLINICAL DATA: 59-year-old male with shortness of breath

EXAM:
PORTABLE CHEST 1 VIEW

[chest ap]
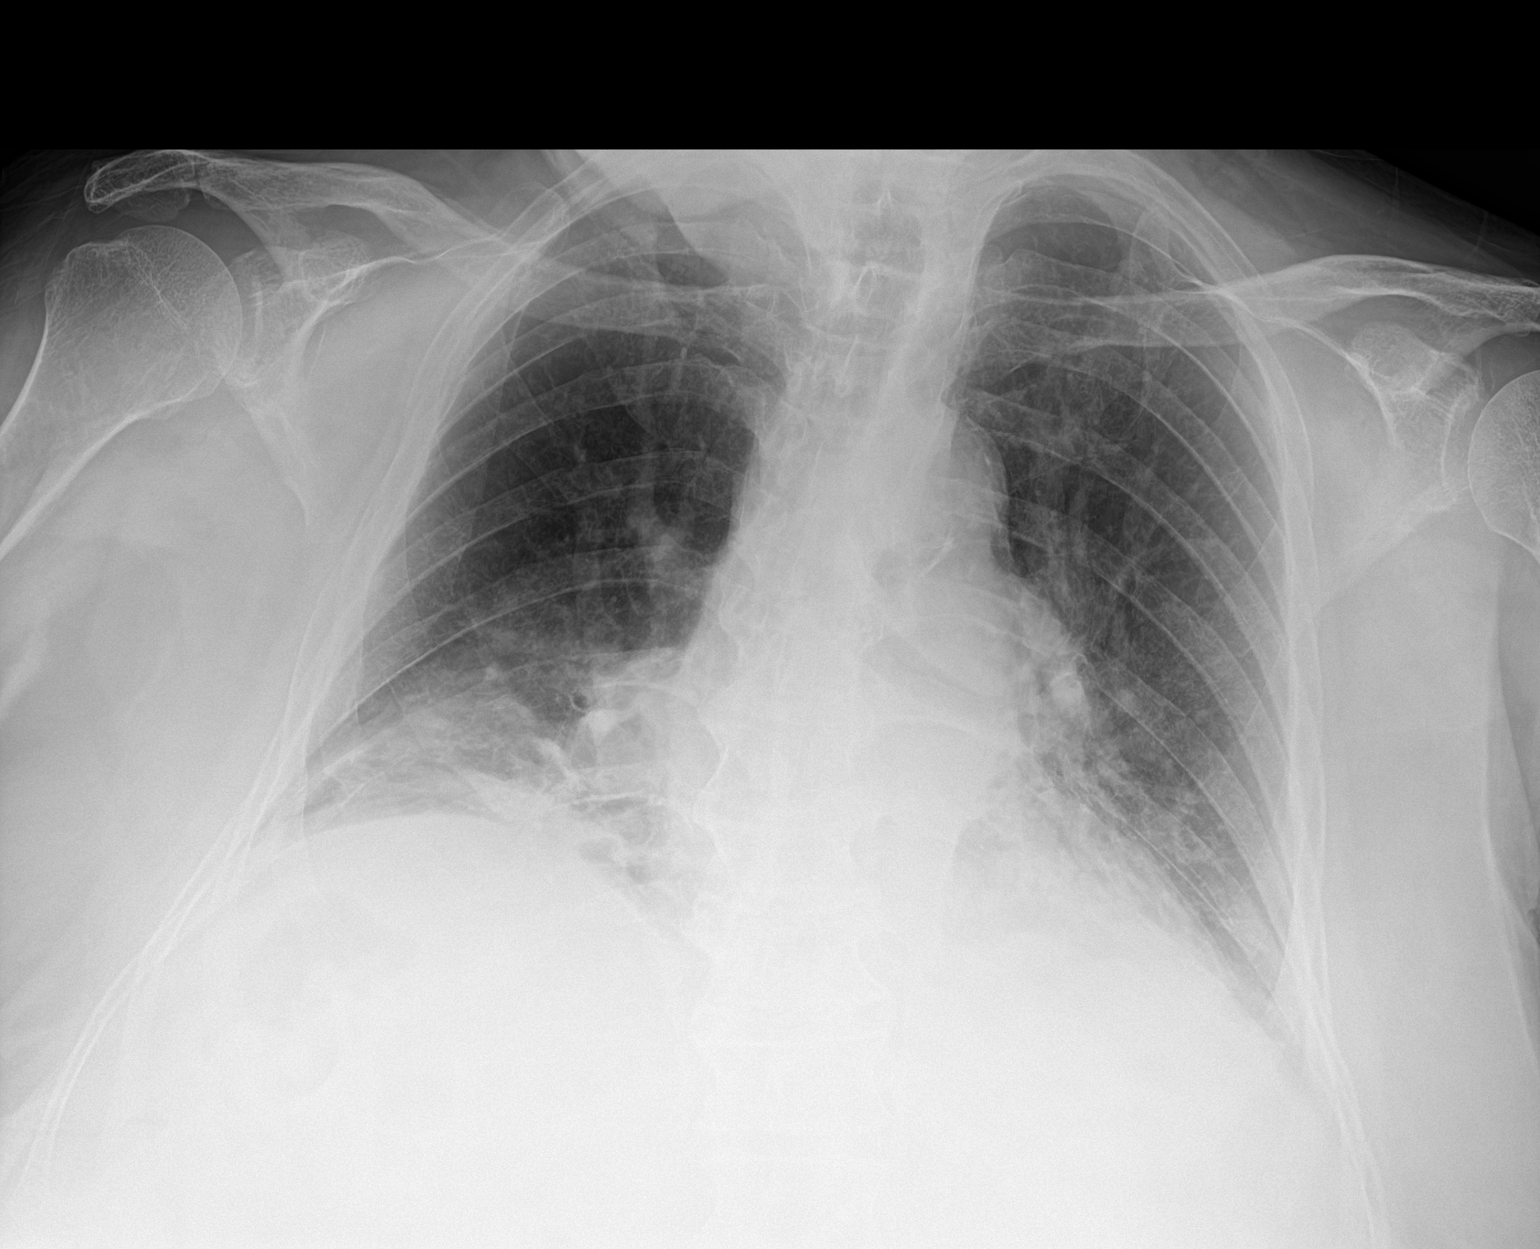

[1 of 1 positions shown; findings below may reference images not displayed]

FINDINGS: Bilateral lower lobe airspace densities may represent atelectasis
versus infiltrate, new since the radiograph of 10/11/2017. No
pleural effusion or pneumothorax. Top-normal cardiac size. No acute
osseous pathology.
IMPRESSION: Bibasilar atelectasis/infiltrate.

## 2020-04-27 IMAGING — CT CT HEAD W/O CM
4 of 6 series · 15 of 47 positions shown, 17 images · non-contrast
Comparison: 09/17/2017 CT head and cervical spine.

CLINICAL DATA: 67 y/o M; fall to floor unable to move since
[REDACTED].

EXAM:
CT HEAD WITHOUT CONTRAST
CT CERVICAL SPINE WITHOUT CONTRAST
TECHNIQUE: Multidetector CT imaging of the head and cervical spine was
performed following the standard protocol without intravenous
contrast. Multiplanar CT image reconstructions of the cervical spine
were also generated.

[Series 2: head wo · axial · 0.47mm/px · z∈[+1767,+1857]mm · 4 of 32 slices shown]
[im 7/32  brain]
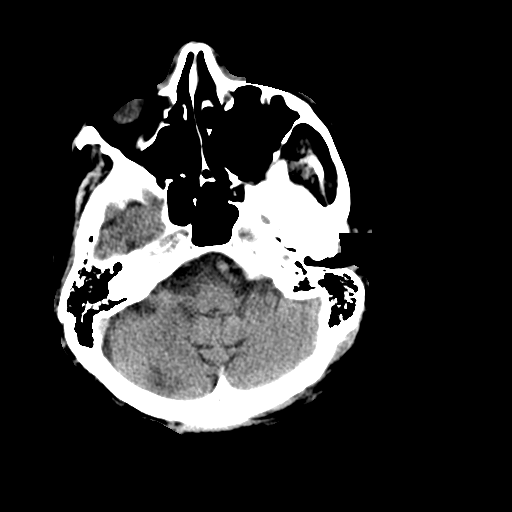
[im 13/32  brain]
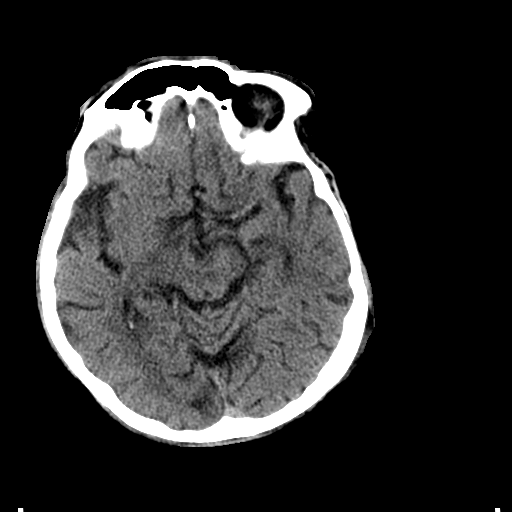
[im 19/32  brain]
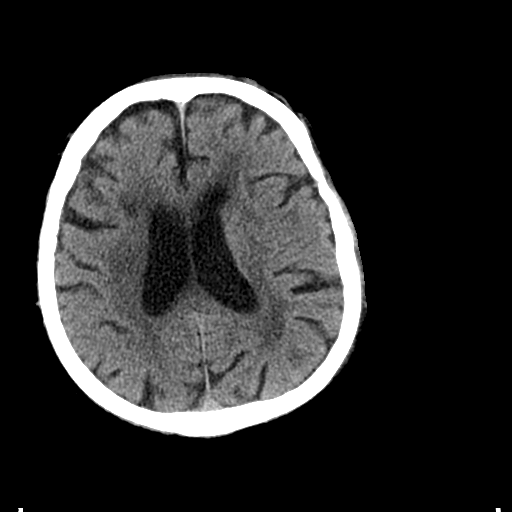
[im 25/32  brain]
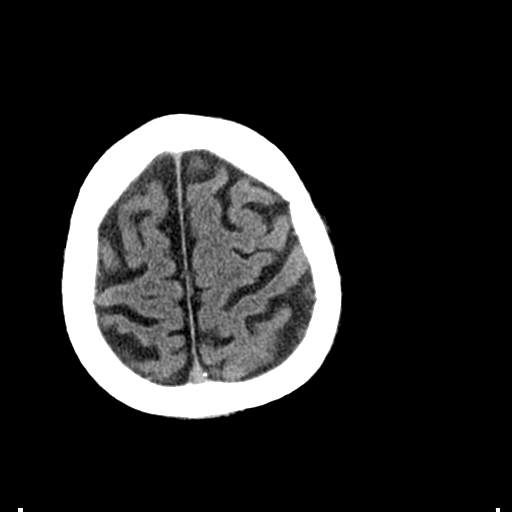

[Series 4: coronal soft tissue · coronal · 0.42mm/px · 3 of 65 slices shown]
[im 22/65  brain]
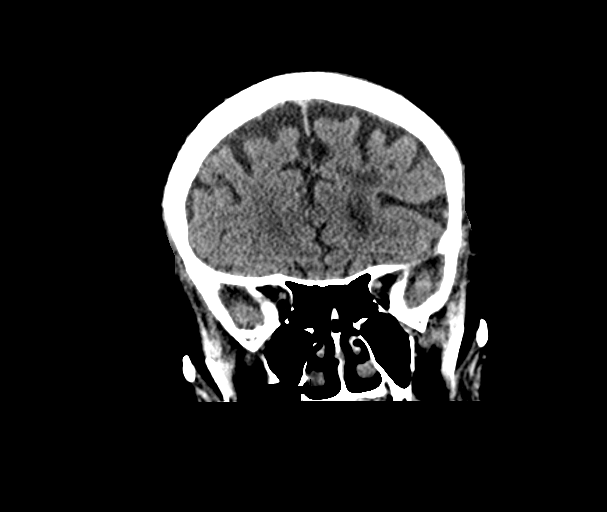
[im 29/65  brain]
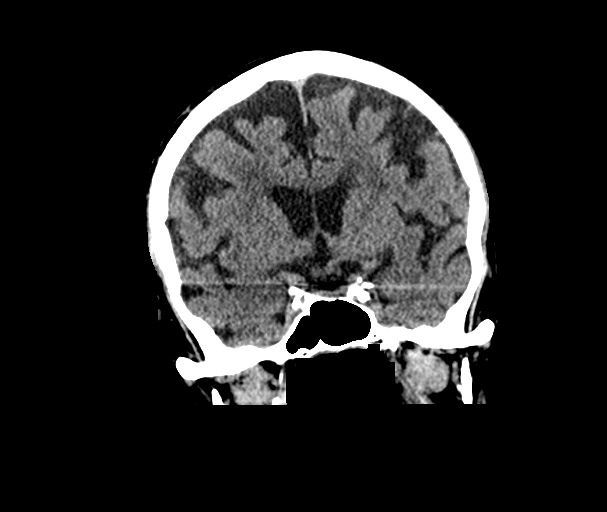
[im 36/65  brain]
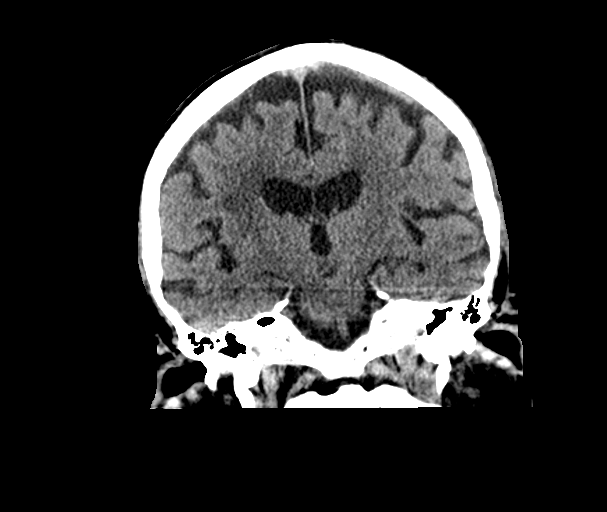

[Series 5: sagittal soft tissue · sagittal · 0.38mm/px · 1 of 55 slices shown]
[im 28/55  brain]
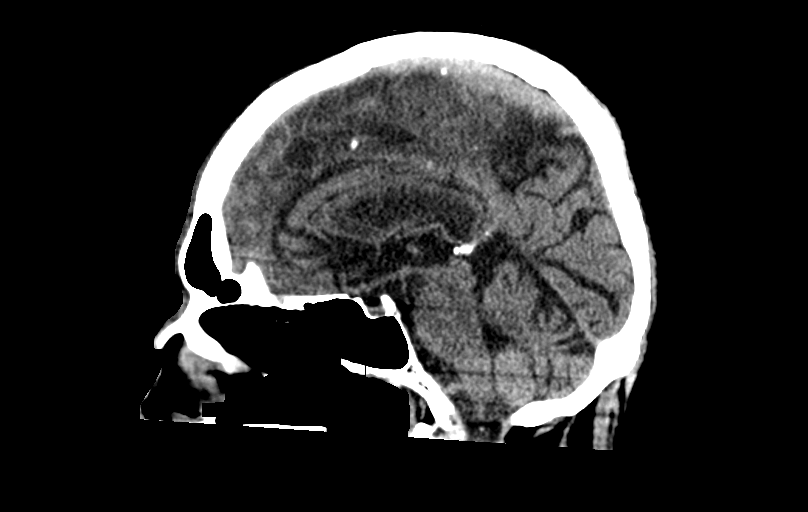

[Series 7: ang.axials · axial · 0.47mm/px · z∈[+1732,+1839]mm · 7 of 50 slices shown, 9 images]
[im 7/50  brain]
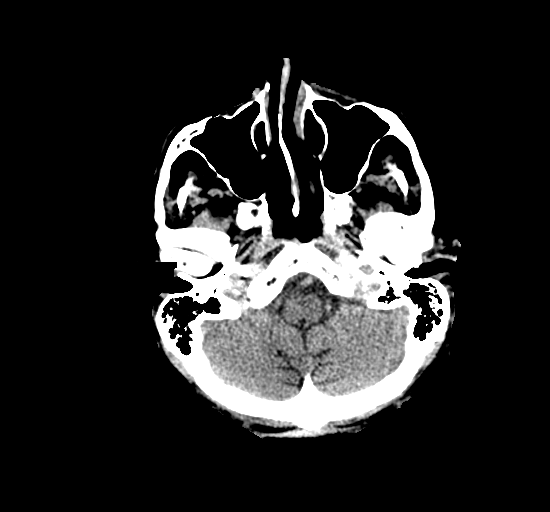
[im 7/50  bone]
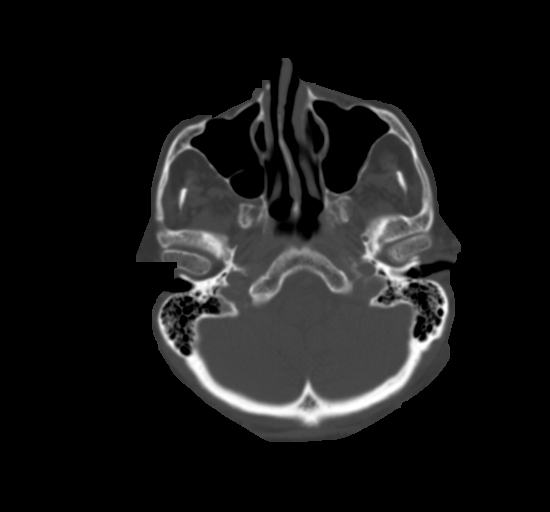
[im 13/50  brain]
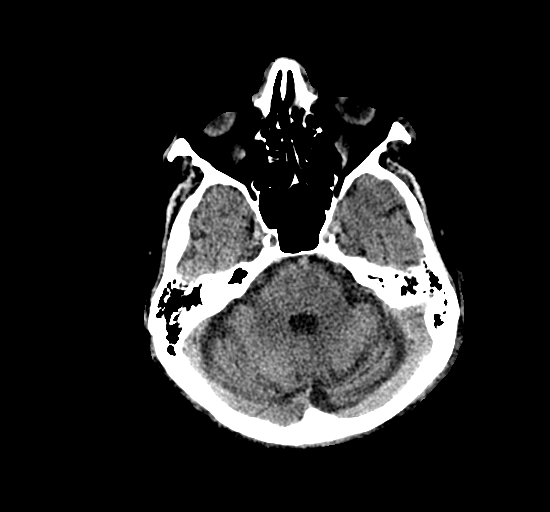
[im 19/50  brain]
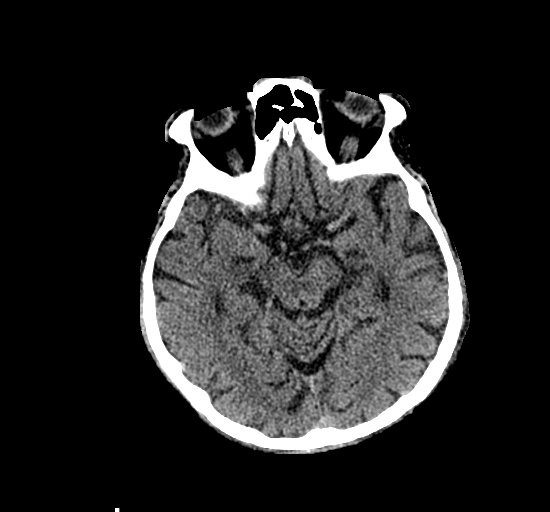
[im 25/50  brain]
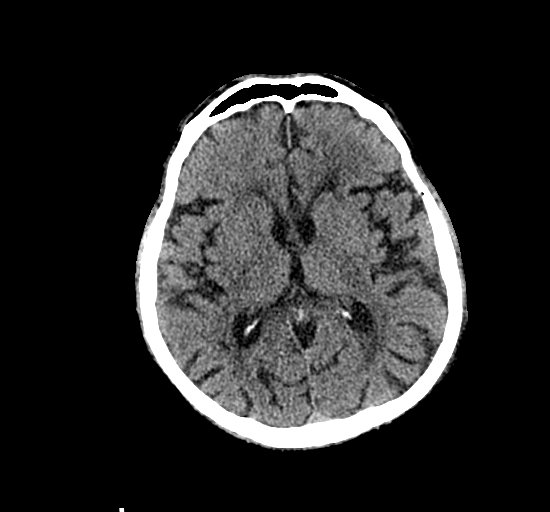
[im 31/50  brain]
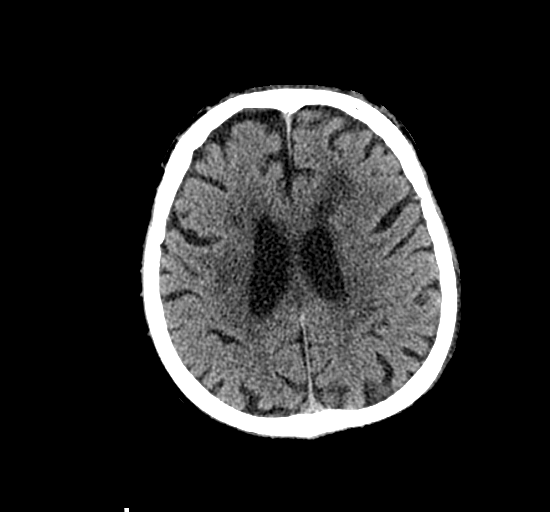
[im 31/50  bone]
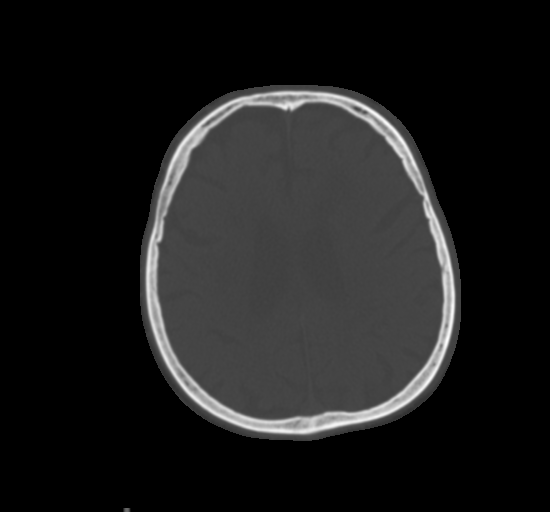
[im 37/50  brain]
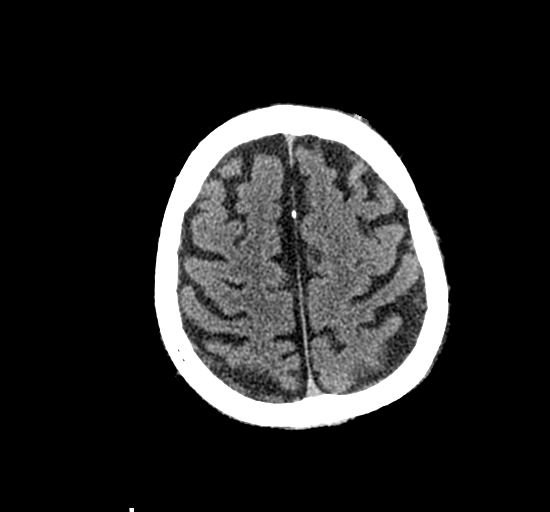
[im 43/50  brain]
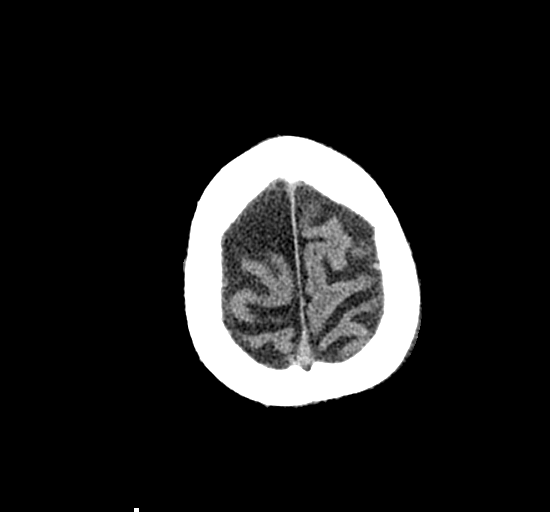

[15 of 47 positions shown; findings below may reference images not displayed]

FINDINGS: CT HEAD FINDINGS

Brain: No evidence of acute infarction, hemorrhage, hydrocephalus,
extra-axial collection or mass lesion/mass effect. Stable moderate
chronic microvascular ischemic changes and volume loss of the brain.
Stable chronic lacunar infarct within left paramedian pons.

Vascular: Calcific atherosclerosis of carotid siphons. No hyperdense
vessel.

Skull: Normal. Negative for fracture or focal lesion.

Sinuses/Orbits: Normal aeration of the visible paranasal sinuses and
mastoid air cells. Left intra-ocular lens replacement.

Other: Right-sided ossicular replacement prosthesis.

CT CERVICAL SPINE FINDINGS

Alignment: Normal.

Skull base and vertebrae: No acute fracture. No primary bone lesion
or focal pathologic process. Chronic T1 spinous process fracture.

Soft tissues and spinal canal: No prevertebral fluid or swelling. No
visible canal hematoma.

Disc levels: Intervertebral disc space heights are maintained. Small
endplate marginal osteophytes are present at the C5-6 level.

Upper chest: Negative.

Other: Mild calcific atherosclerosis of the carotid bifurcations.
IMPRESSION: CT head:

1. No acute intracranial abnormality or calvarial fracture.
2. Stable chronic microvascular ischemic changes, volume loss of the
brain, and small chronic left paramedian pontine infarct.

CT cervical spine:

No acute fracture or dislocation. Chronic T1 spinous process
fracture.

By: Reiner Zj M.D.

## 2020-04-27 IMAGING — CR DG SHOULDER 2+V*L*
2 series · 2 of 2 positions shown · non-contrast
Comparison: 06/26/2014

CLINICAL DATA: Fall 2 days ago with left shoulder pain, initial
encounter

EXAM:
LEFT SHOULDER - 2+ VIEW

[x shoulder ap left (1 of 2)]
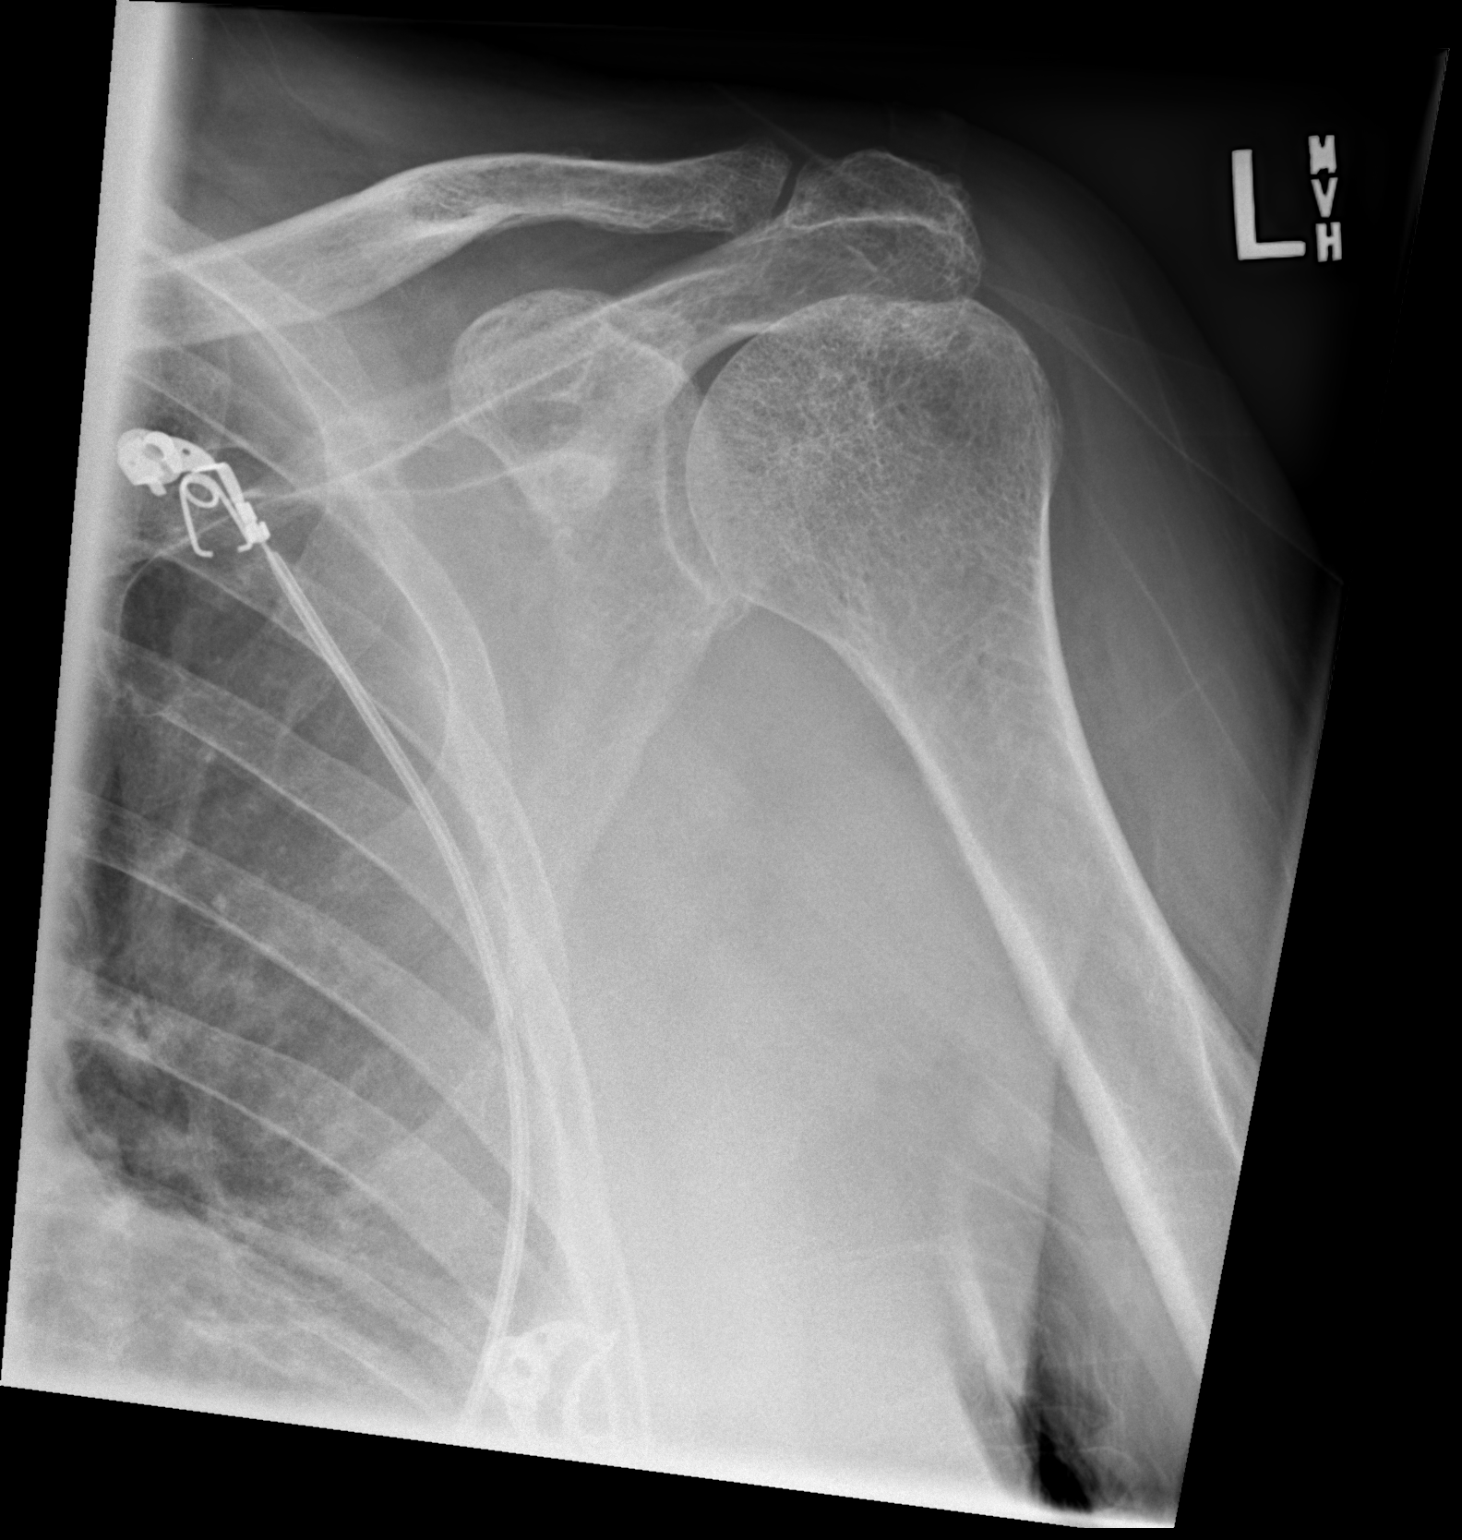

[x shoulder ap left (2 of 2)]
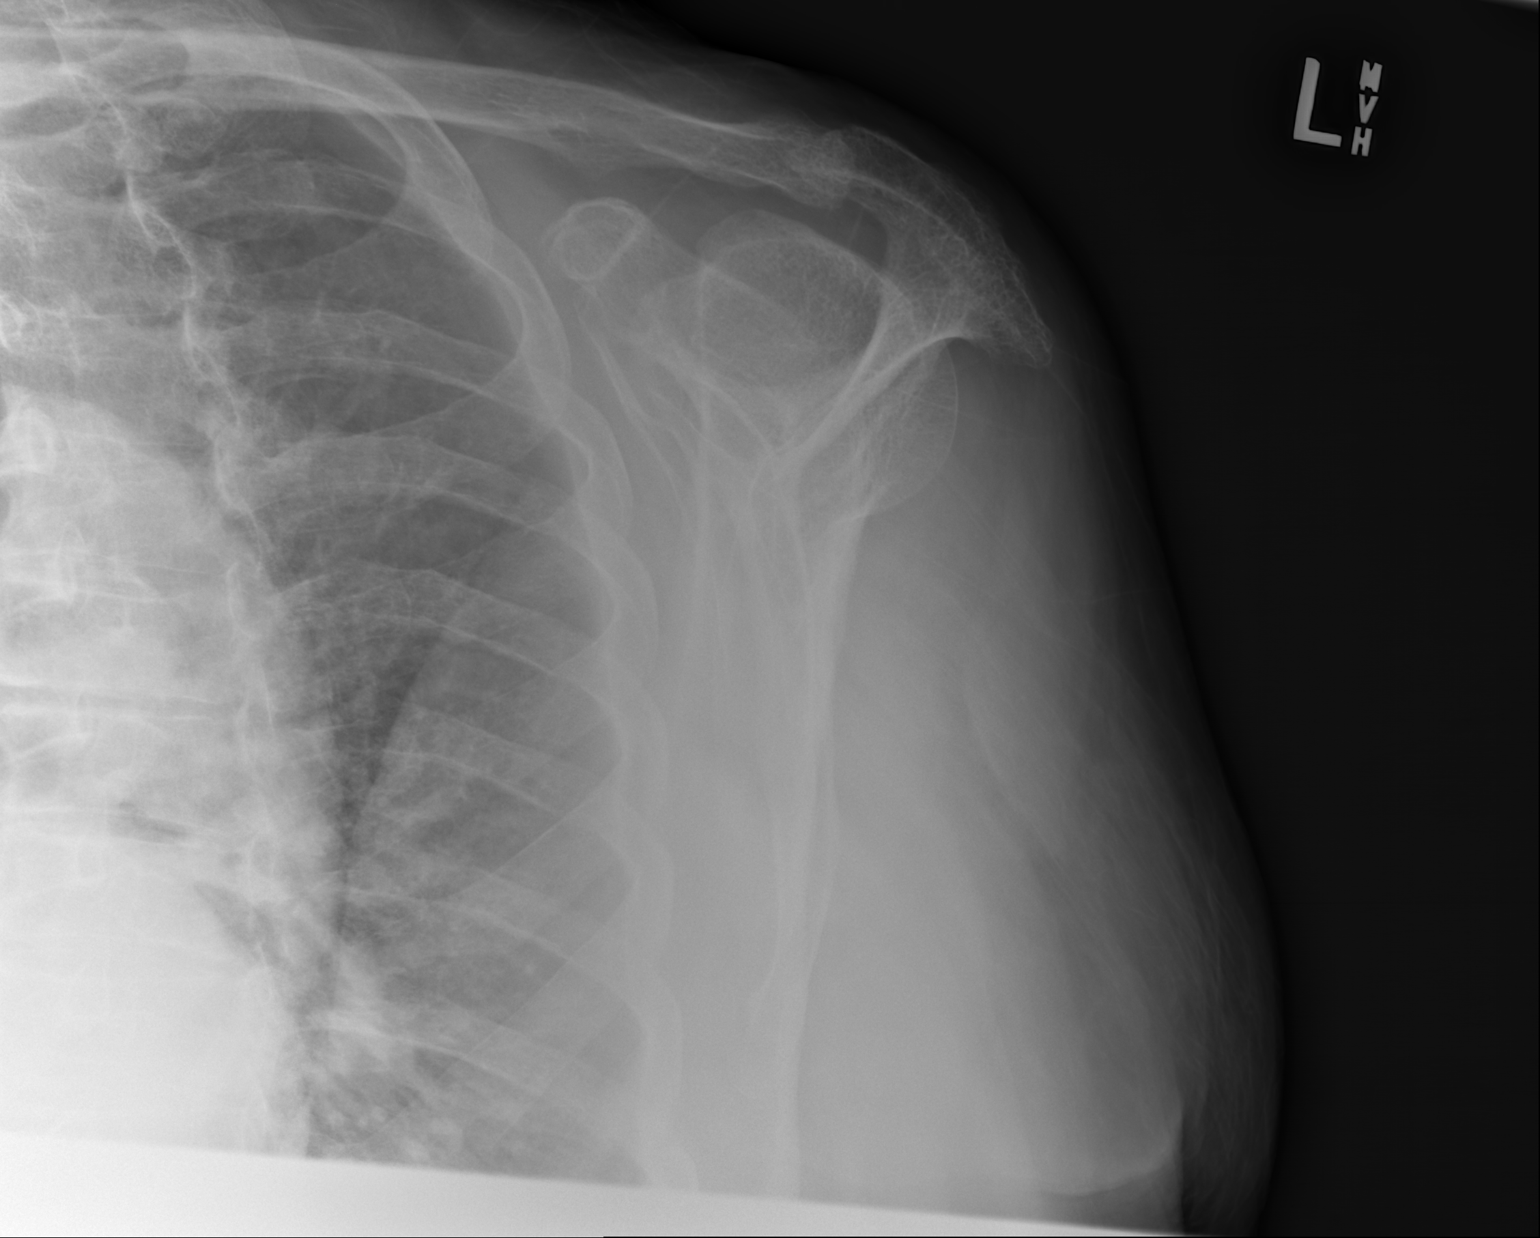

[2 of 2 positions shown; findings below may reference images not displayed]

FINDINGS: Degenerative changes of the acromioclavicular joint are noted. No
acute fracture or dislocation is seen. No soft tissue abnormality is
noted. The underlying bony thorax is within normal limits.
IMPRESSION: Mild degenerative change without acute abnormality.

## 2020-04-27 IMAGING — CR DG CHEST 2V
3 series · 3 of 3 positions shown · non-contrast
Comparison: 10/14/2017

CLINICAL DATA: Fall 2 days ago with persistent chest pain, initial
encounter

EXAM:
CHEST - 2 VIEW

[w chest lat (1 of 2)]
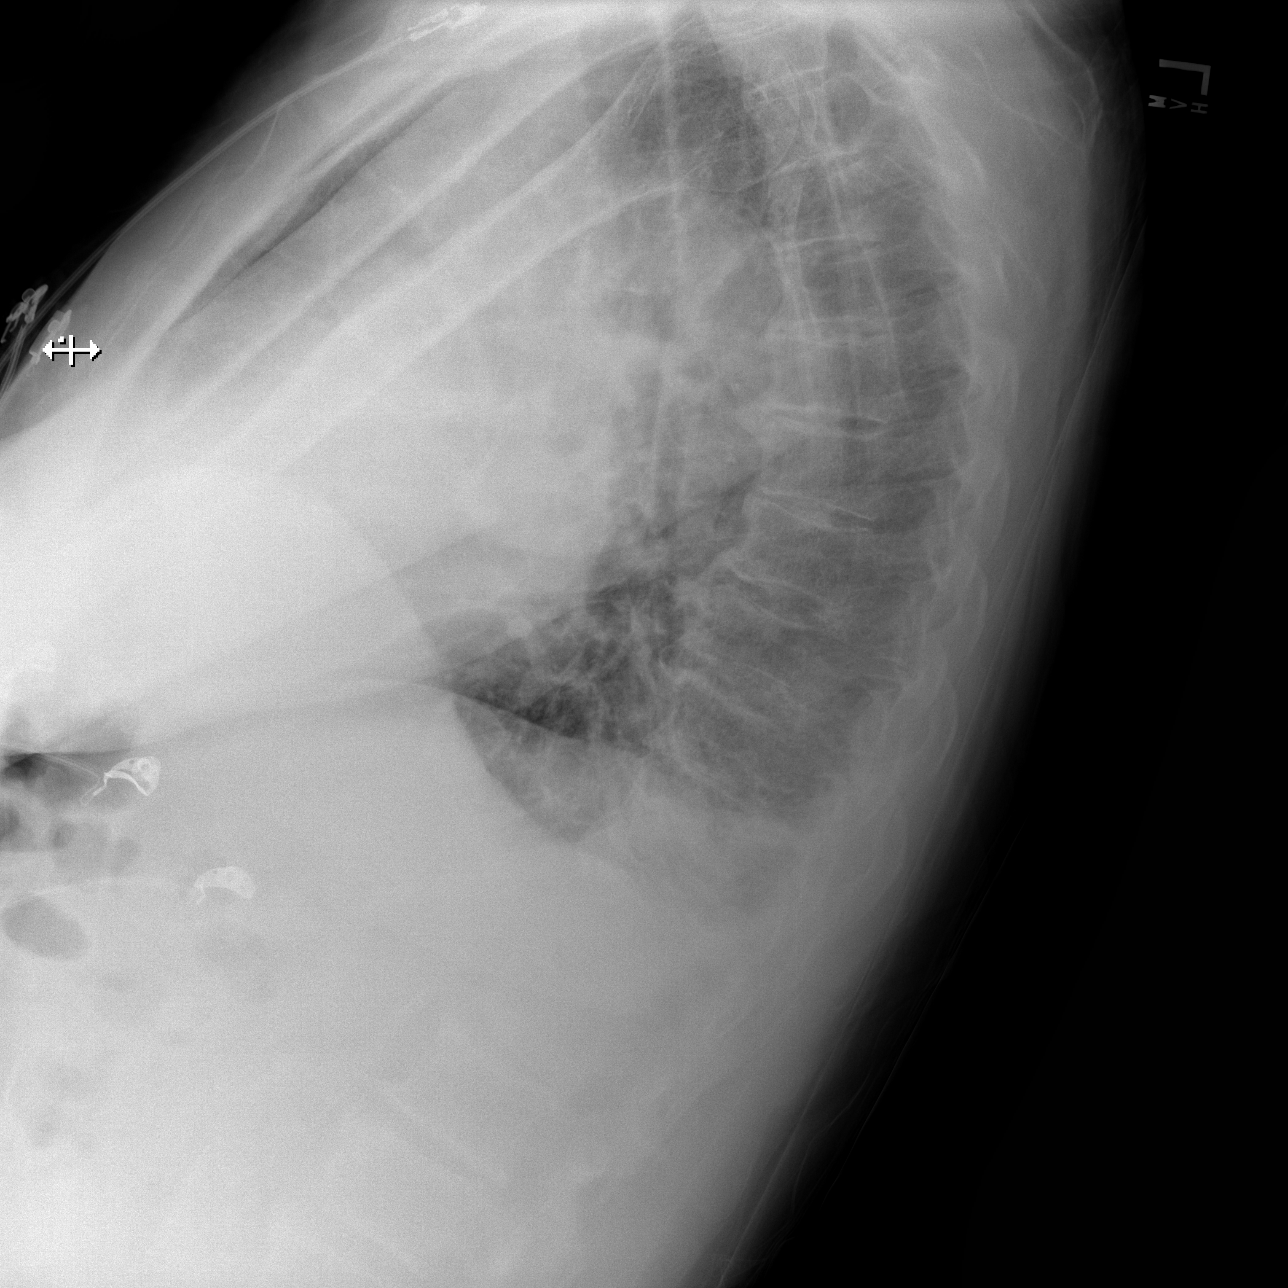

[w chest lat (2 of 2)]
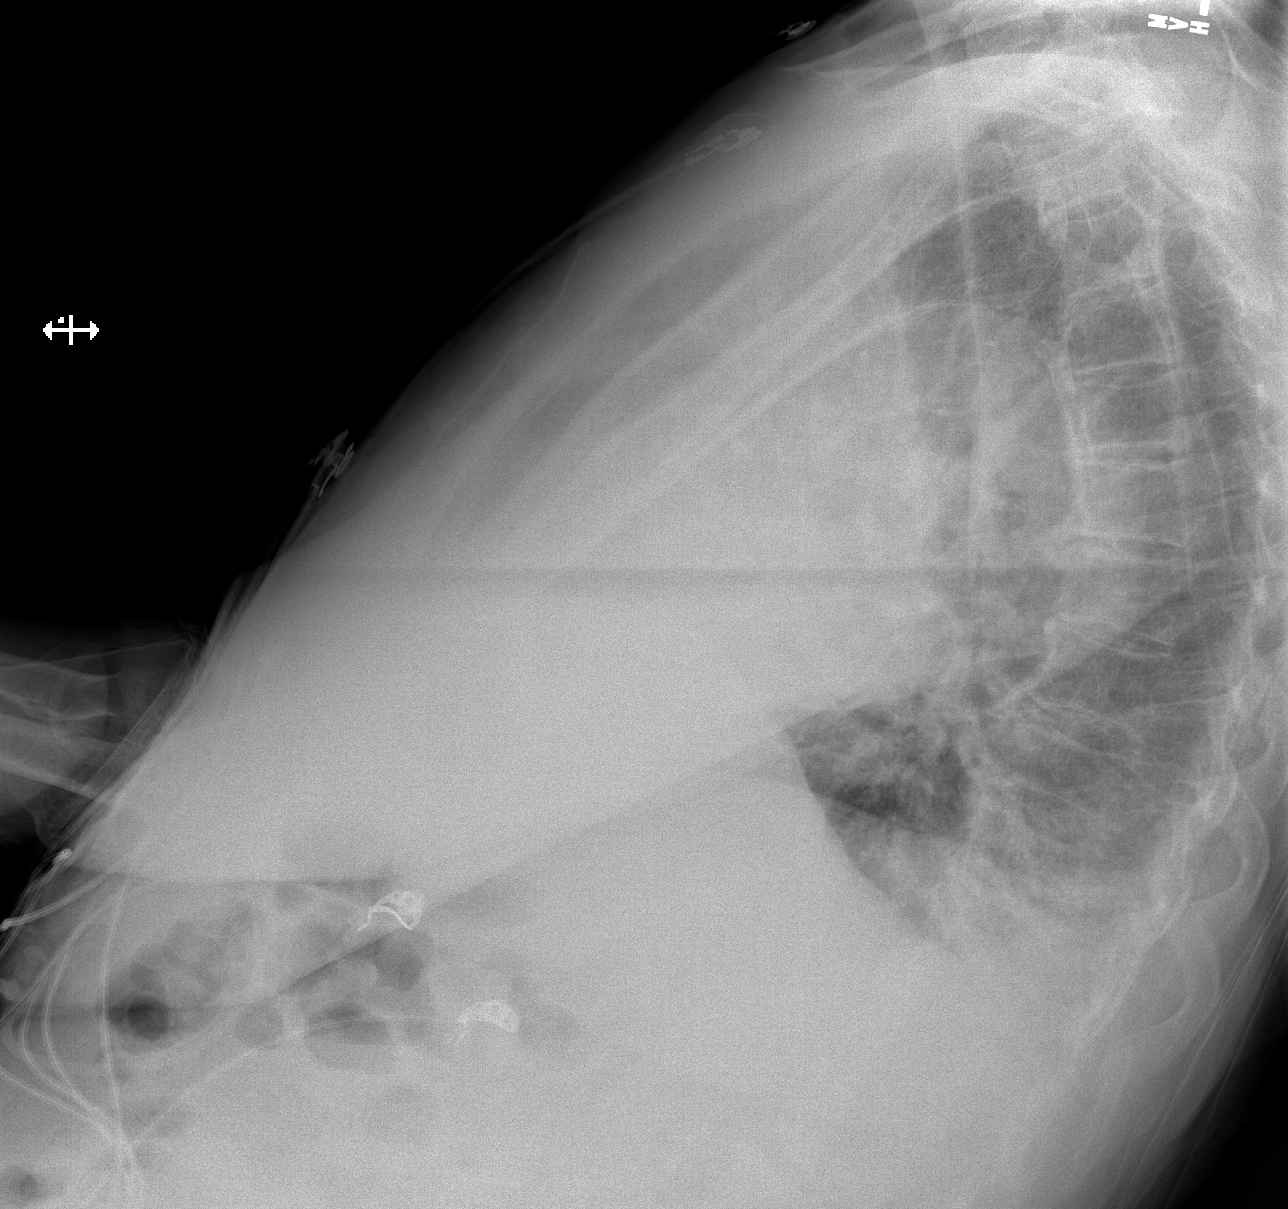

[x chest ap]
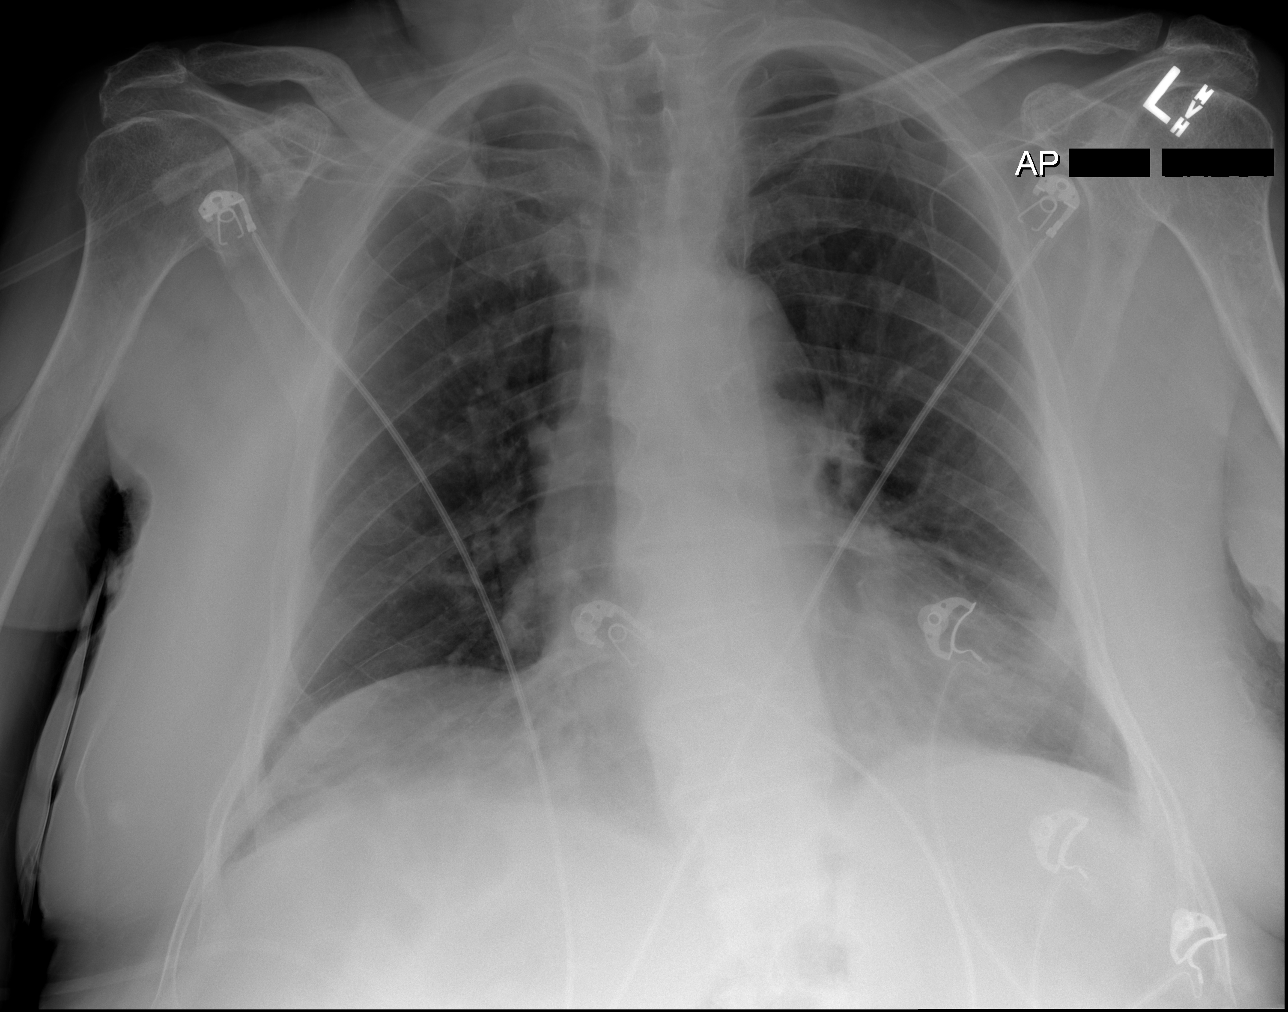

[3 of 3 positions shown; findings below may reference images not displayed]

FINDINGS: Cardiac shadow is within normal limits. The lungs are well aerated
bilaterally. Minimal right basilar atelectasis is seen. No
pneumothorax is. Degenerative changes of the thoracic spine are
seen. No definitive rib fractures are noted.
IMPRESSION: Mild right basilar atelectasis.

No pneumothorax or rib abnormality is seen.

## 2020-04-28 IMAGING — MR MR HEAD W/O CM
9 of 11 series · 37 of 48 positions shown · non-contrast
Comparison: CT studies 10/31/2017

CLINICAL DATA: Fell in the bathroom 3 days ago.  Possible stroke.

EXAM:
MRI HEAD WITHOUT CONTRAST
TECHNIQUE: Multiplanar, multiecho pulse sequences of the brain and surrounding
structures were obtained without intravenous contrast.

[Series 3: DWI · axial · 3.0mm · 1.09mm/px · z∈[-17,+142]mm · 8 of 110 slices shown (1 of 4)]
[im 1/110]
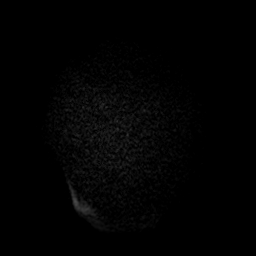
[im 13/110]
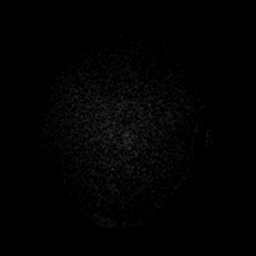
[im 37/110]
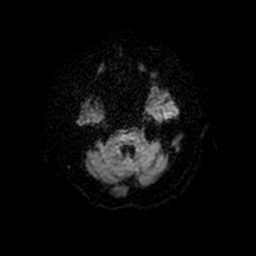
[im 49/110]
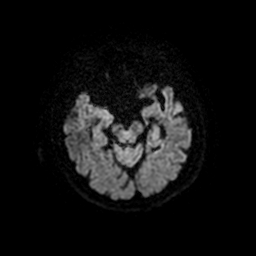
[im 61/110]
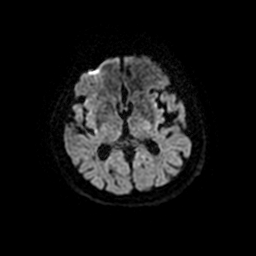
[im 73/110]
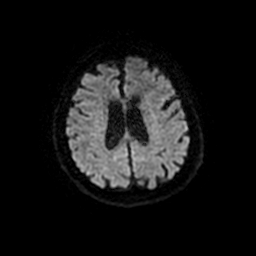
[im 97/110]
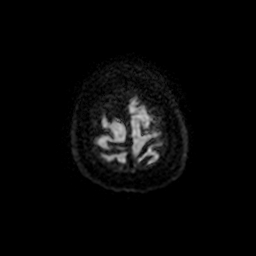
[im 110/110]
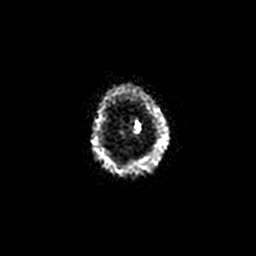

[Series 4: T1 · sagittal · 5.0mm · 0.47mm/px · 2 of 24 slices shown]
[im 1/24]
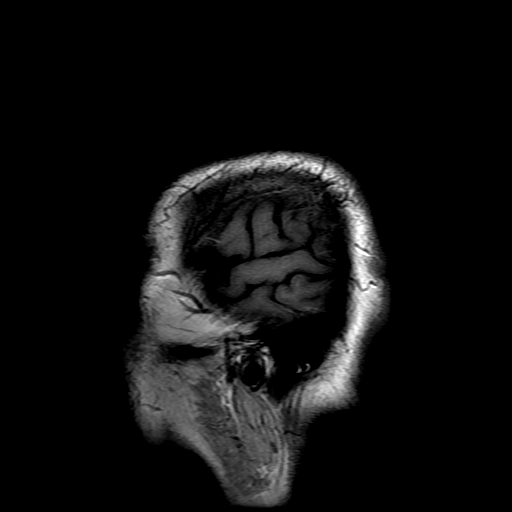
[im 24/24]
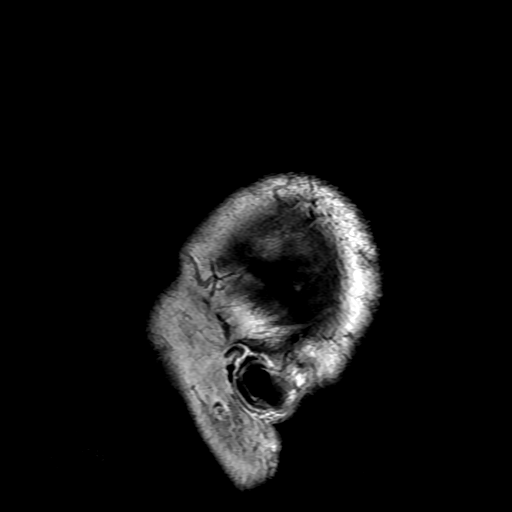

[Series 5: DWI · coronal · 3.0mm · 1.09mm/px · 8 of 110 slices shown (2 of 4)]
[im 1/110]
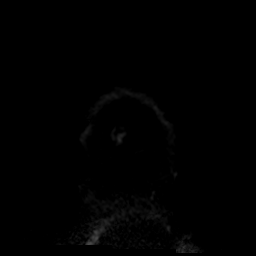
[im 13/110]
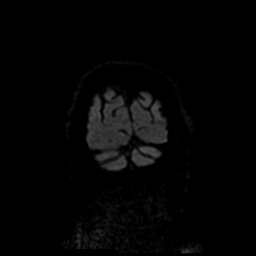
[im 37/110]
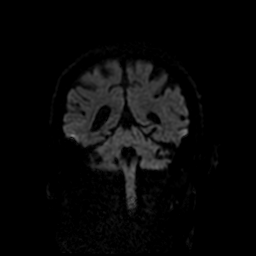
[im 49/110]
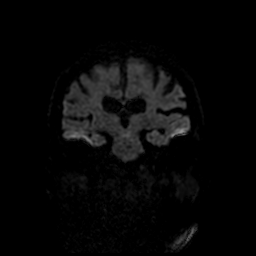
[im 61/110]
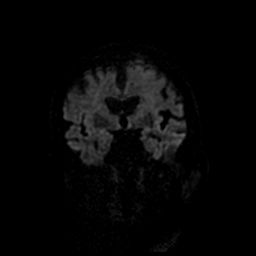
[im 73/110]
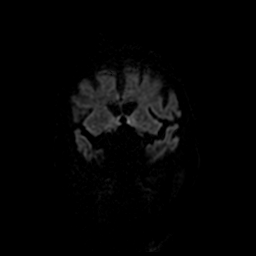
[im 97/110]
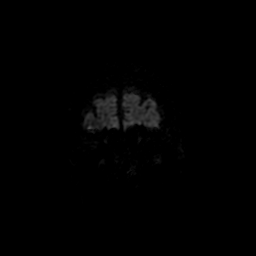
[im 110/110]
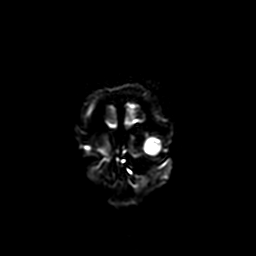

[Series 6: T2 · axial · 5.0mm · 0.43mm/px · z∈[-16,+141]mm · 2 of 28 slices shown (1 of 3)]
[im 1/28]
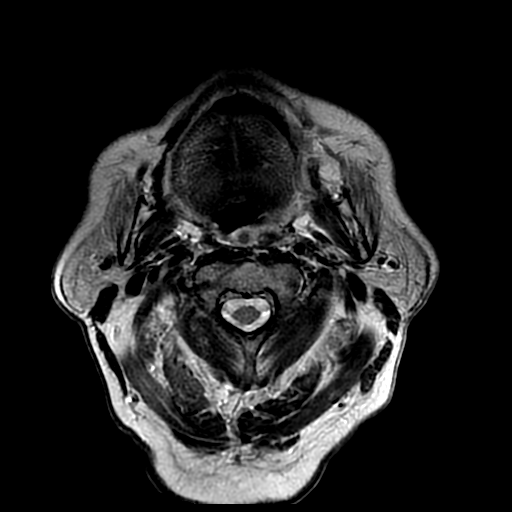
[im 28/28]
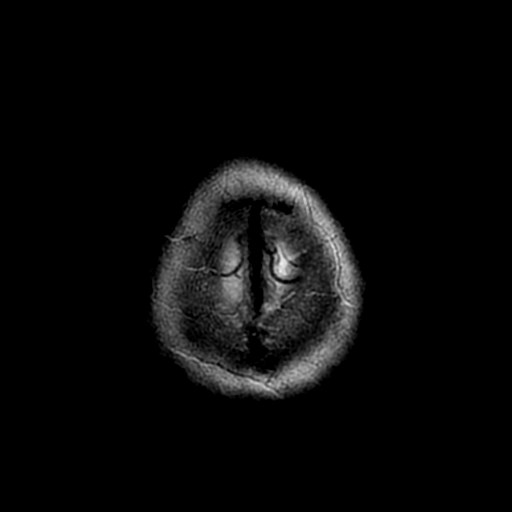

[Series 7: FLAIR · axial · 5.0mm · 0.43mm/px · z∈[-16,+141]mm · 2 of 28 slices shown]
[im 1/28]
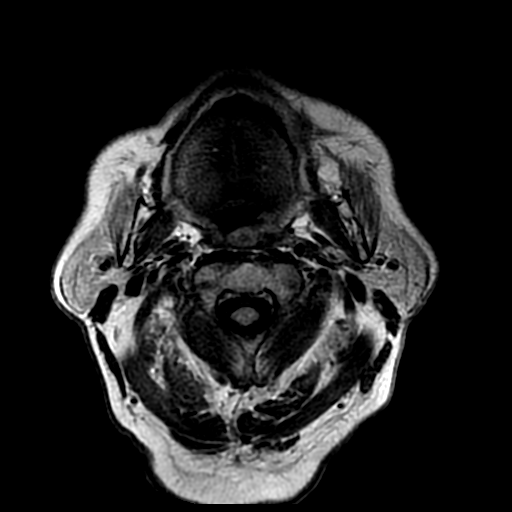
[im 28/28]
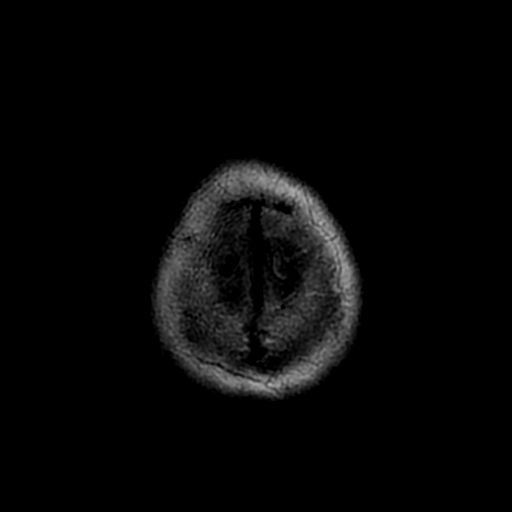

[Series 10: T2 · coronal · 5.0mm · 0.47mm/px · 2 of 24 slices shown (2 of 3)]
[im 1/24]
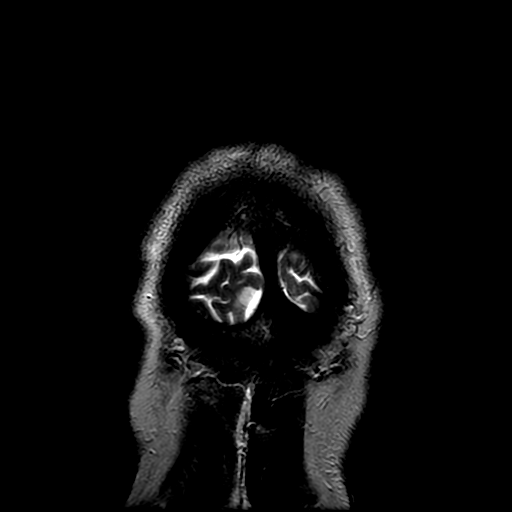
[im 24/24]
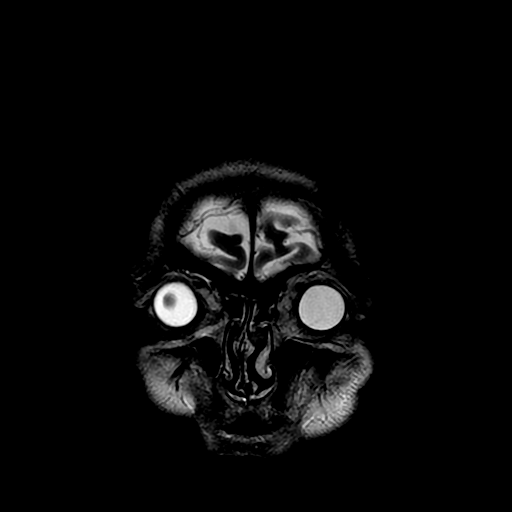

[Series 11: T2 · coronal · 3.0mm · 0.39mm/px · 3 of 34 slices shown (3 of 3)]
[im 1/34]
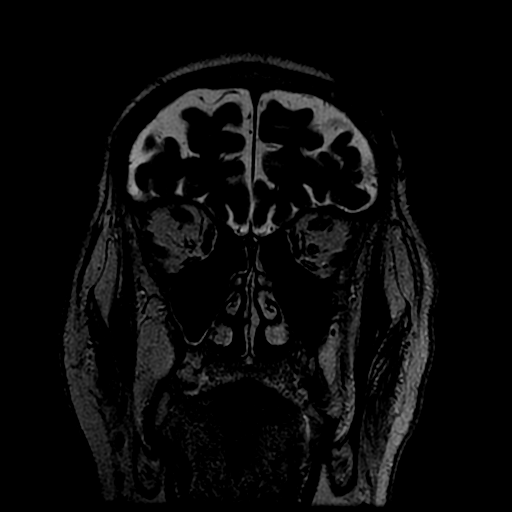
[im 17/34]
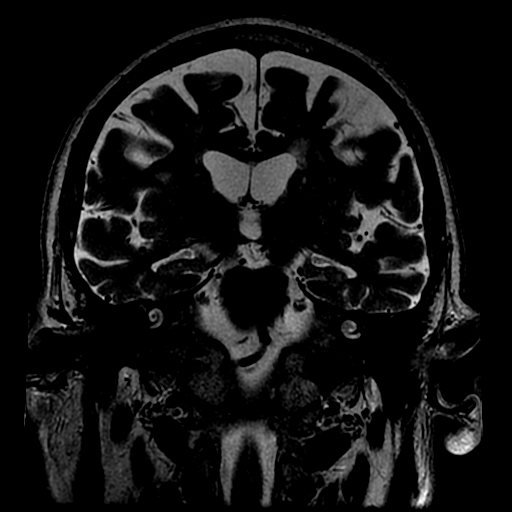
[im 34/34]
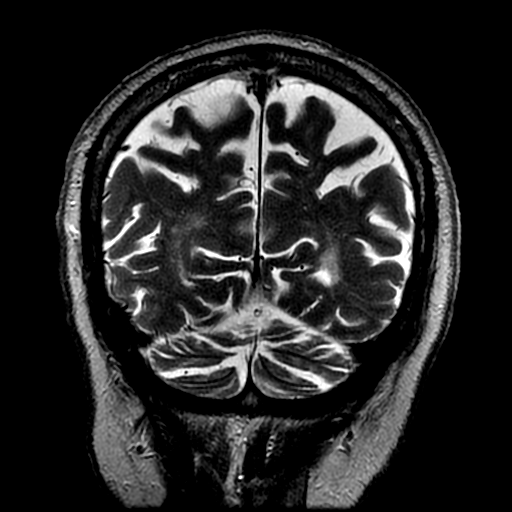

[Series 300: DWI · axial · 3.0mm · 1.09mm/px · z∈[-17,+142]mm · 5 of 55 slices shown (3 of 4)]
[im 1/55]
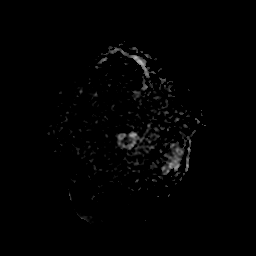
[im 14/55]
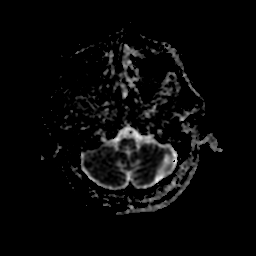
[im 28/55]
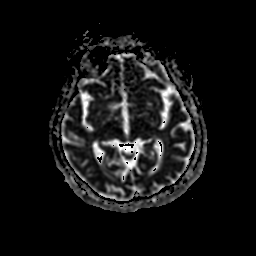
[im 41/55]
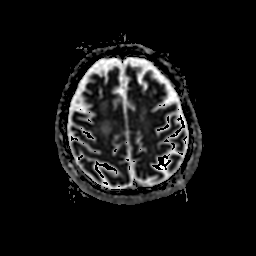
[im 55/55]
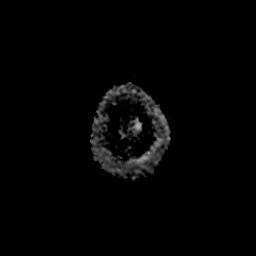

[Series 500: DWI · coronal · 3.0mm · 1.09mm/px · 5 of 55 slices shown (4 of 4)]
[im 1/55]
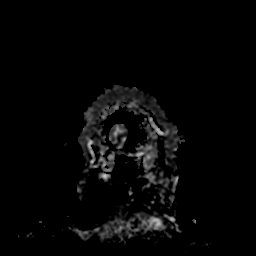
[im 14/55]
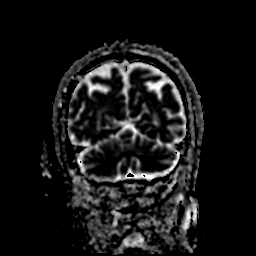
[im 28/55]
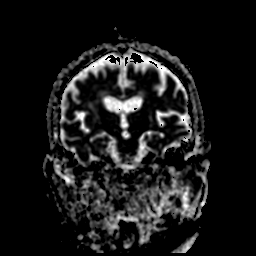
[im 41/55]
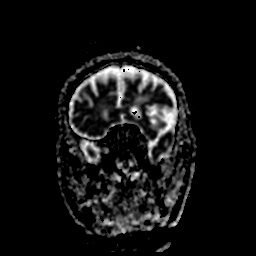
[im 55/55]
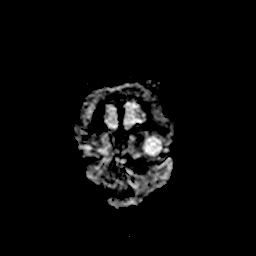

[37 of 48 positions shown; findings below may reference images not displayed]

FINDINGS: Brain: Diffusion imaging shows a 5 mm acute infarction affecting the
cortical and subcortical brain at the deep insula/temporoparietal
junction on the left. No swelling, hemorrhage or mass effect. No
other acute infarction. There chronic small-vessel ischemic changes
of the pons. There are a few old small vessel cerebellar
infarctions. Cerebral hemispheres elsewhere show chronic
small-vessel ischemic changes the white matter. No large vessel
territory infarction. Mass lesion, hemorrhage, hydrocephalus or
extra-axial collection.

Vascular: Major vessels at the base of the brain show flow.

Skull and upper cervical spine: Negative

Sinuses/Orbits: Clear/normal

Other: None
IMPRESSION: 5 mm acute infarction affecting the cortical and subcortical brain
at the deep insula/temporoparietal junction region on the left. No
mass effect or hemorrhage.

Atrophy and extensive chronic small-vessel ischemic changes
elsewhere.

## 2020-04-29 IMAGING — DX DG SHOULDER 1V*L*
1 series · 1 of 1 positions shown · non-contrast
Comparison: 10/31/2017

CLINICAL DATA: Shoulder pain

EXAM:
LEFT SHOULDER - 1 VIEW

[shoulder axial]
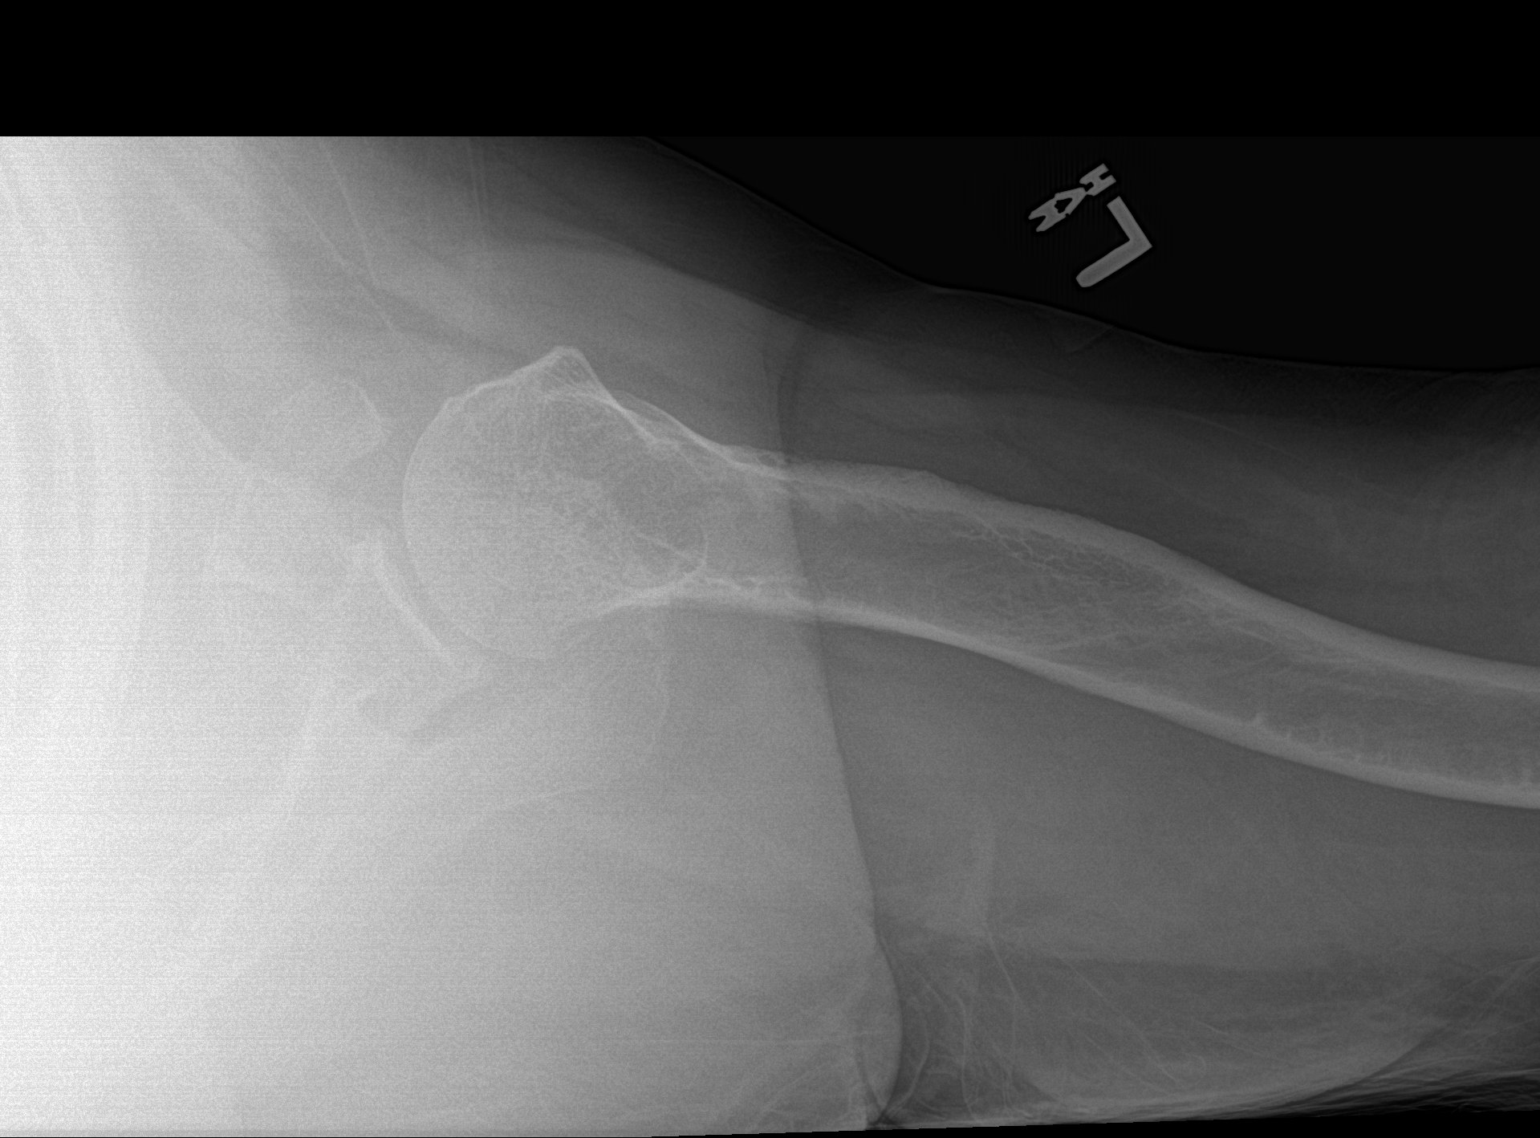

[1 of 1 positions shown; findings below may reference images not displayed]

FINDINGS: Axillary view shows normal alignment.  No obvious fracture.
IMPRESSION: No acute osseous abnormality

## 2020-05-04 IMAGING — DX DG CHEST 1V PORT
1 series · 1 of 1 positions shown · non-contrast
Comparison: 10/31/2017

CLINICAL DATA: Evaluate hypoxia.

EXAM:
PORTABLE CHEST - 1 VIEW

[chest ap]
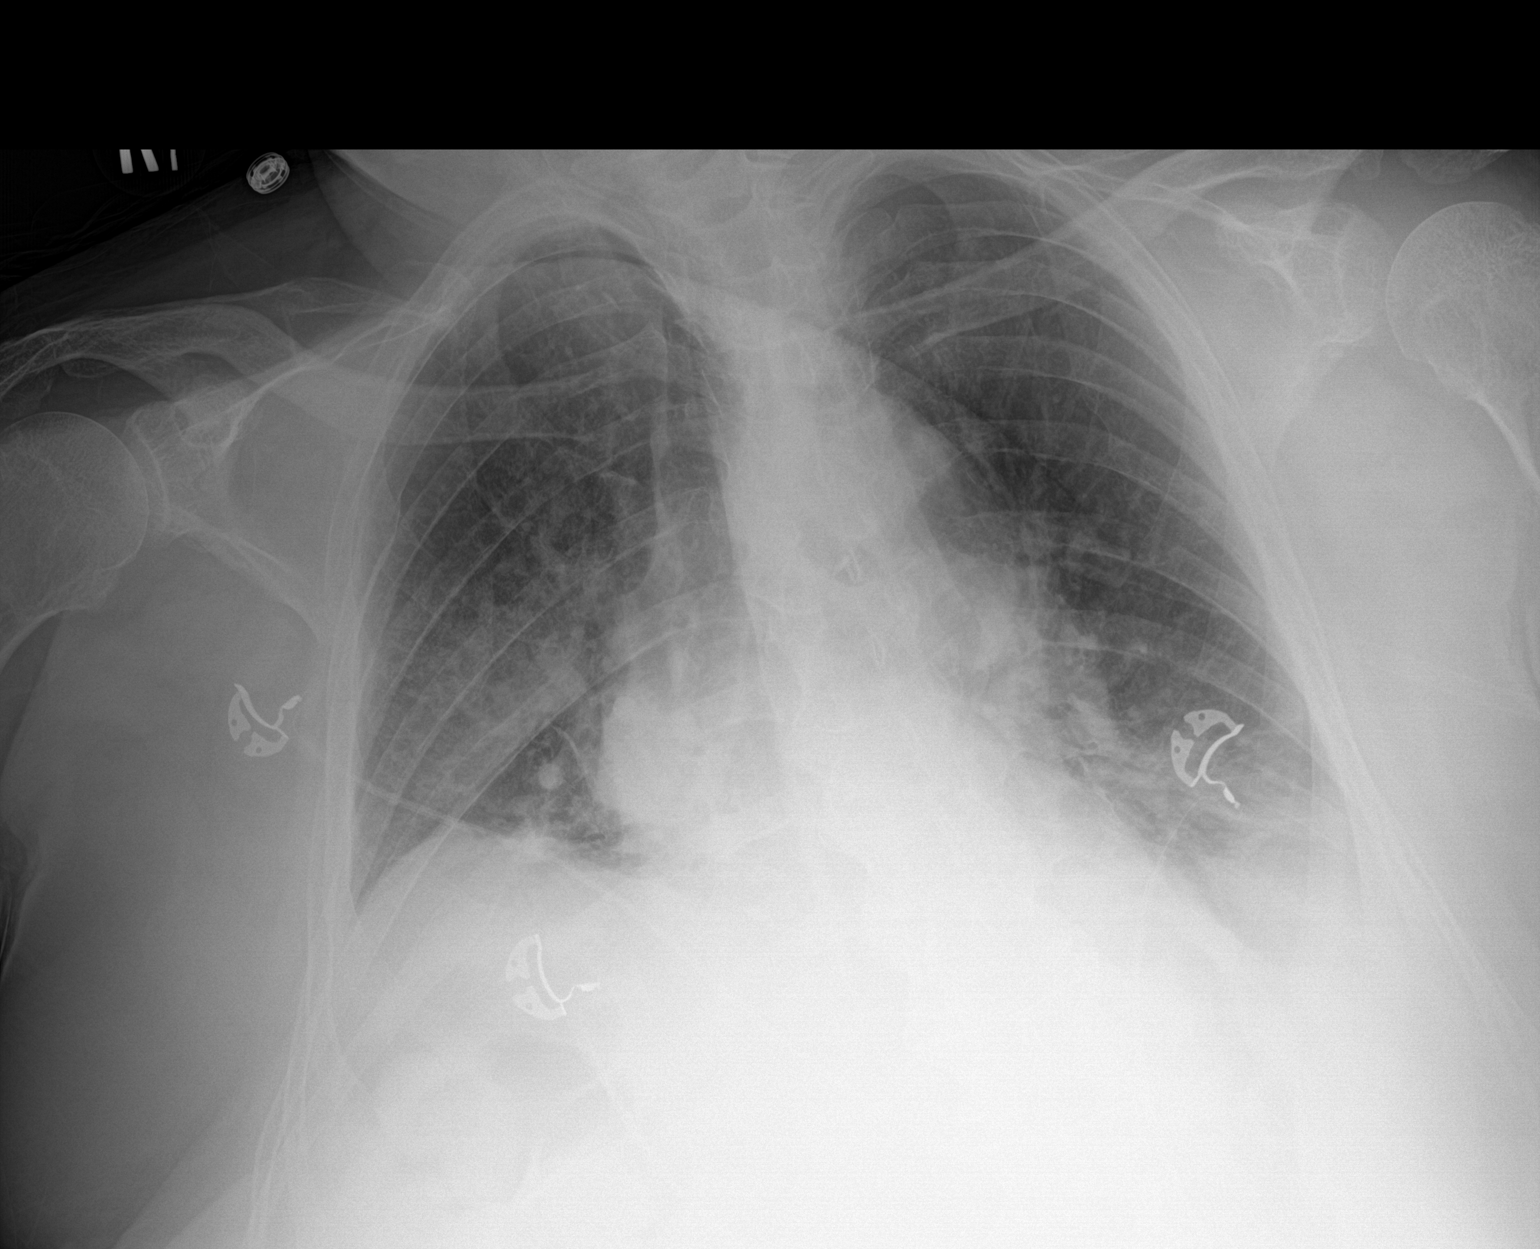

[1 of 1 positions shown; findings below may reference images not displayed]

FINDINGS: New interstitial and airspace infiltrates or edema, predominately in
the right mid lung in the lung bases left greater than right.
Central pulmonary vascular congestion.

Heart size upper limits normal.

Blunting of left lateral costophrenic angle suggesting small
effusion. No pneumothorax.

Visualized bones unremarkable.
IMPRESSION: 1. Asymmetric infiltrates or edema, new since previous
2. Probable small left pleural effusion.

## 2020-05-06 IMAGING — RF DG SWALLOWING FUNCTION - NRPT [facility]
19 of 21 series · 20 of 24 positions shown · non-contrast
Comparison: none

[Series 1: cp_standard · 0.34mm/px · 1 of 54 frames shown (1 of 19)]
[frame 1/54]
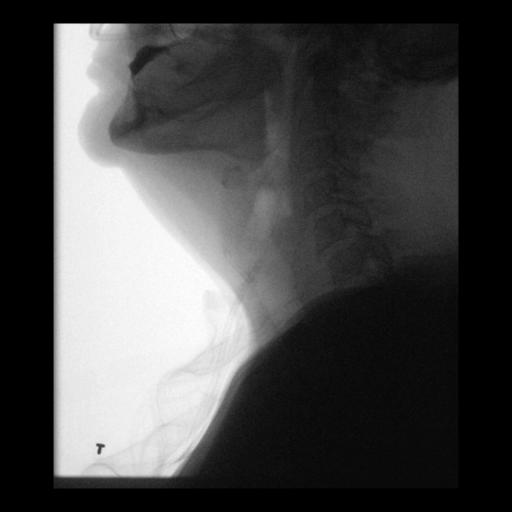

[Series 2: cp_standard · 0.34mm/px · 1 of 129 frames shown (2 of 19)]
[frame 4/129]
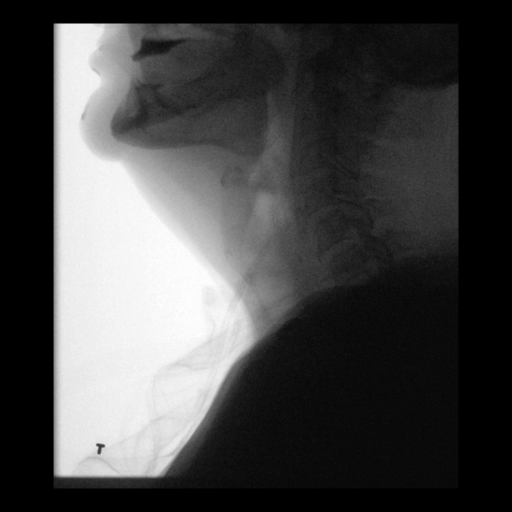

[Series 3: cp_standard · 0.34mm/px · 1 of 52 frames shown (3 of 19)]
[frame 45/52]
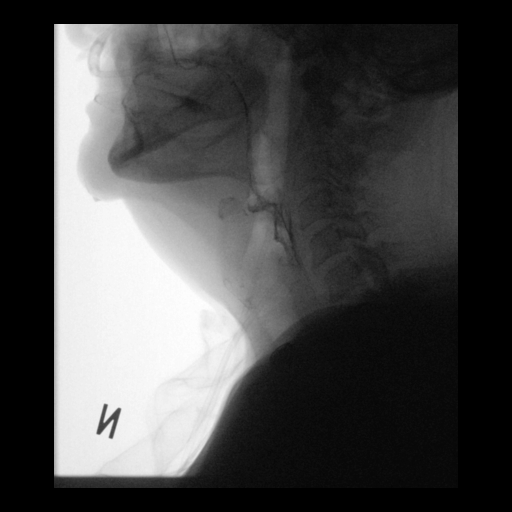

[Series 4: cp_standard · 0.34mm/px · 1 of 46 frames shown (4 of 19)]
[frame 24/46]
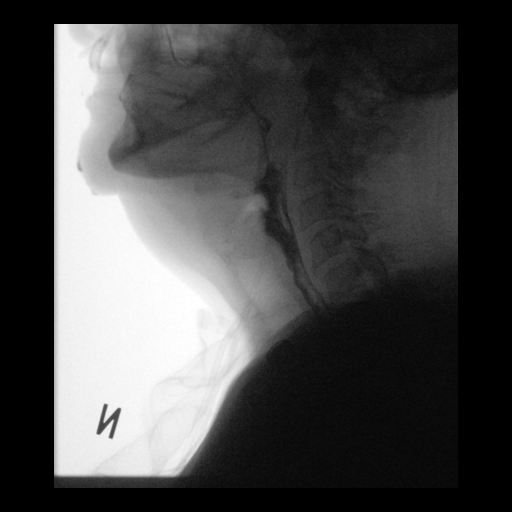

[Series 5: cp_standard · 0.34mm/px · 1 of 66 frames shown (5 of 19)]
[frame 57/66]
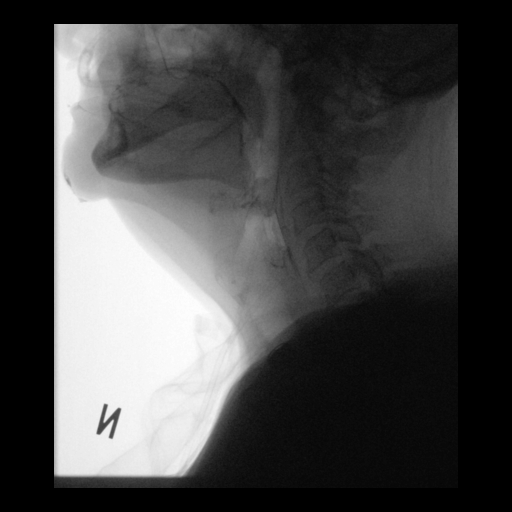

[Series 6: cp_standard · 0.34mm/px · 1 of 46 frames shown (6 of 19)]
[frame 24/46]
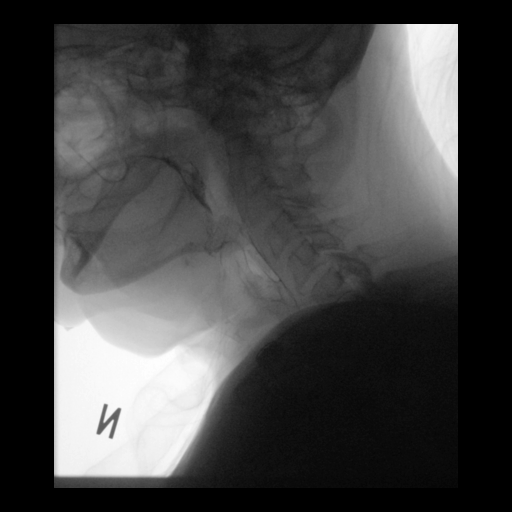

[Series 7: cp_standard · 0.34mm/px · 1 of 171 frames shown (7 of 19)]
[frame 48/171]
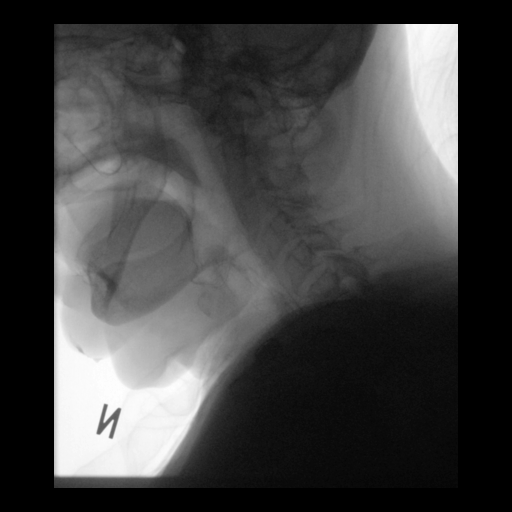

[Series 9: cp_standard · 0.34mm/px · 2 of 56 frames shown (8 of 19)]
[frame 9/56]
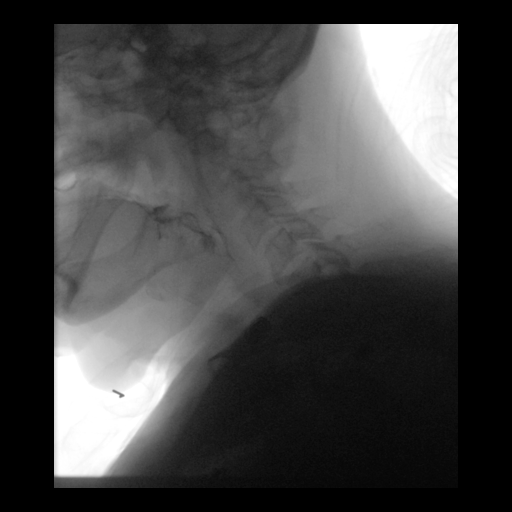
[frame 48/56]
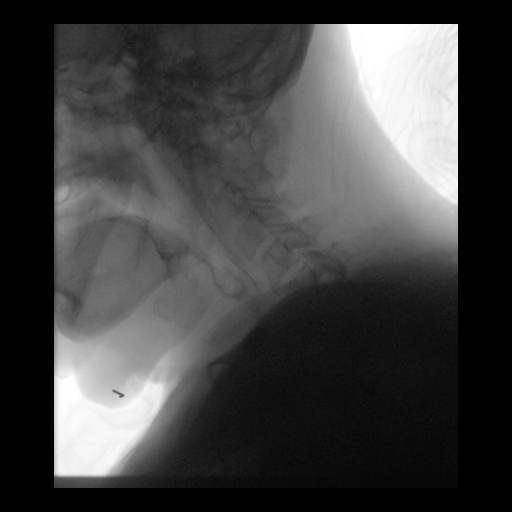

[Series 10: cp_standard · 0.34mm/px · 1 of 51 frames shown (9 of 19)]
[frame 44/51]
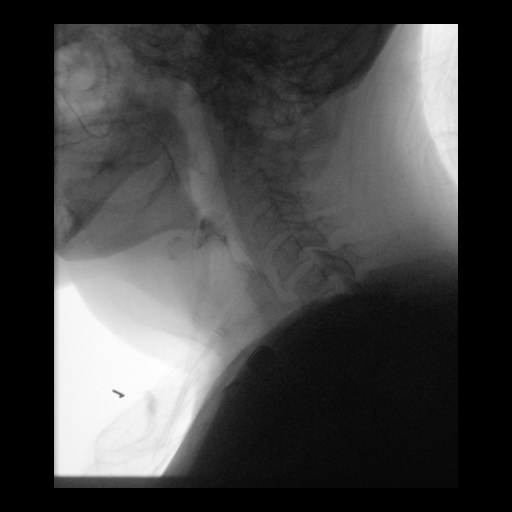

[Series 11: cp_standard · 0.34mm/px · 1 of 38 frames shown (10 of 19)]
[frame 20/38]
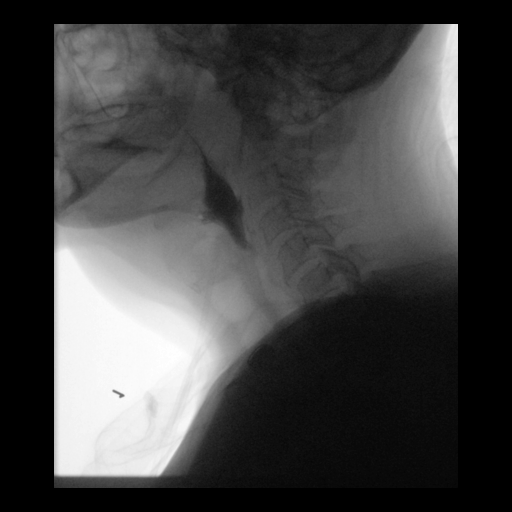

[Series 12: cp_standard · 0.34mm/px · 1 of 39 frames shown (11 of 19)]
[frame 20/39]
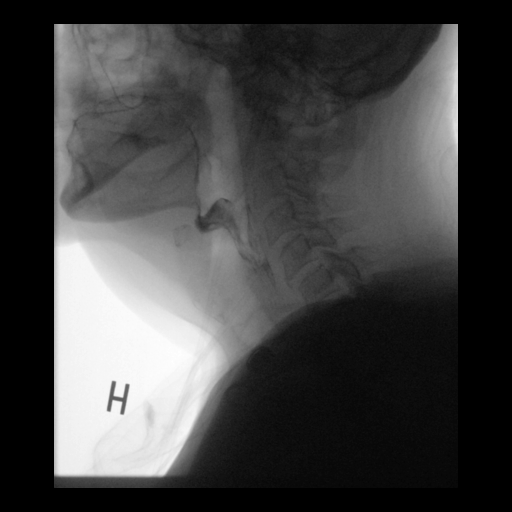

[Series 14: cp_standard · 0.34mm/px · 1 of 8 frames shown (12 of 19)]
[frame 5/8]
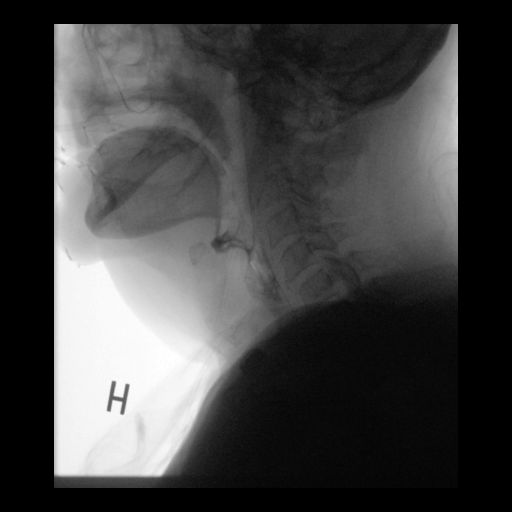

[Series 15: cp_standard · 0.34mm/px · 1 of 44 frames shown (13 of 19)]
[frame 7/44]
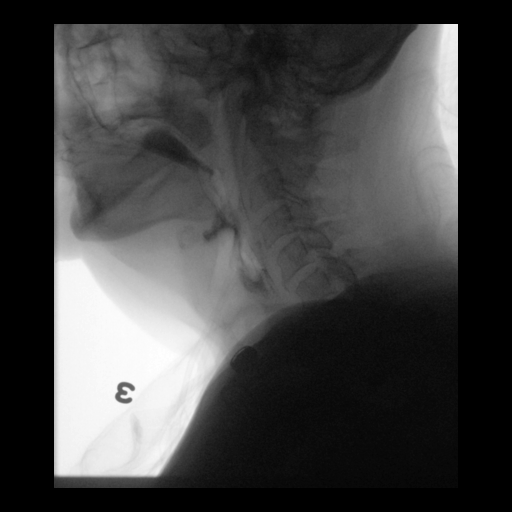

[Series 16: cp_standard · 0.34mm/px · 1 of 47 frames shown (14 of 19)]
[frame 24/47]
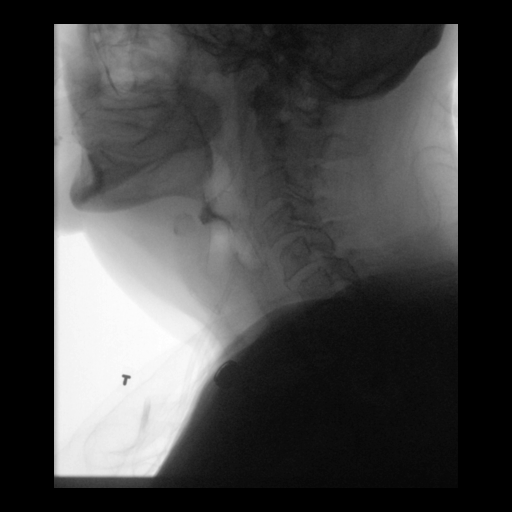

[Series 17: cp_standard · 0.34mm/px · 1 of 186 frames shown (15 of 19)]
[frame 28/186]
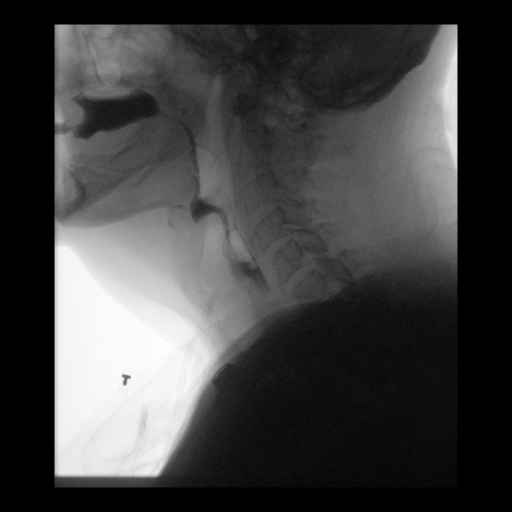

[Series 18: cp_standard · 0.34mm/px · 1 of 134 frames shown (16 of 19)]
[frame 18/134]
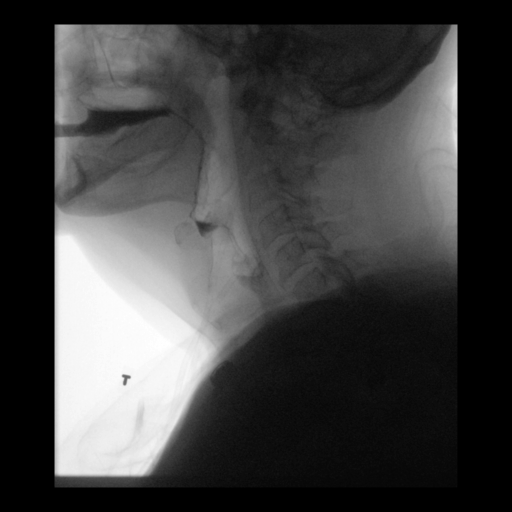

[Series 19: cp_standard · 0.34mm/px · 1 of 209 frames shown (17 of 19)]
[frame 178/209]
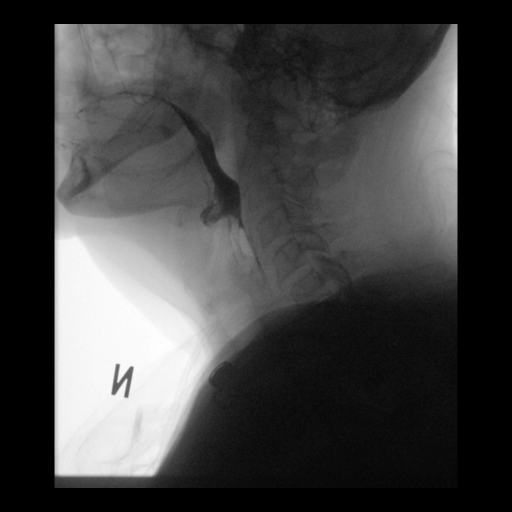

[Series 20: cp_standard · 0.34mm/px · 1 of 50 frames shown (18 of 19)]
[frame 43/50]
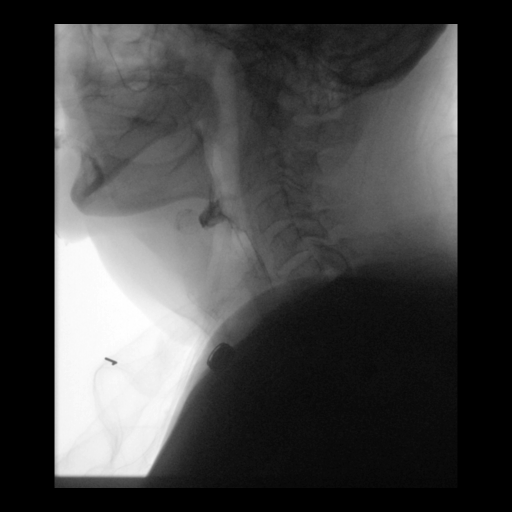

[Series 21: cp_standard · 0.34mm/px · 1 of 99 frames shown (19 of 19)]
[frame 85/99]
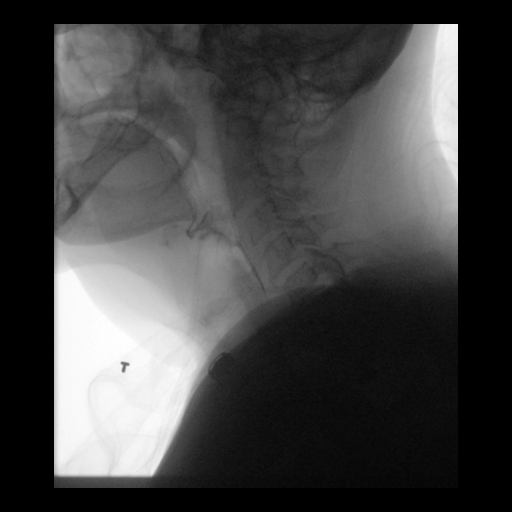

[20 of 24 positions shown; findings below may reference images not displayed]

FLUOROSCOPY FOR SWALLOWING FUNCTION STUDY:
Fluoroscopy was provided for swallowing function study, which was administered by a speech pathologist.  Final results and recommendations from this study are contained within the speech pathology report.

## 2020-05-07 IMAGING — DX DG CHEST 1V PORT
1 series · 1 of 1 positions shown · non-contrast
Comparison: 11/07/2017

CLINICAL DATA: Shortness of Breath

EXAM:
PORTABLE CHEST 1 VIEW

[chest ap]
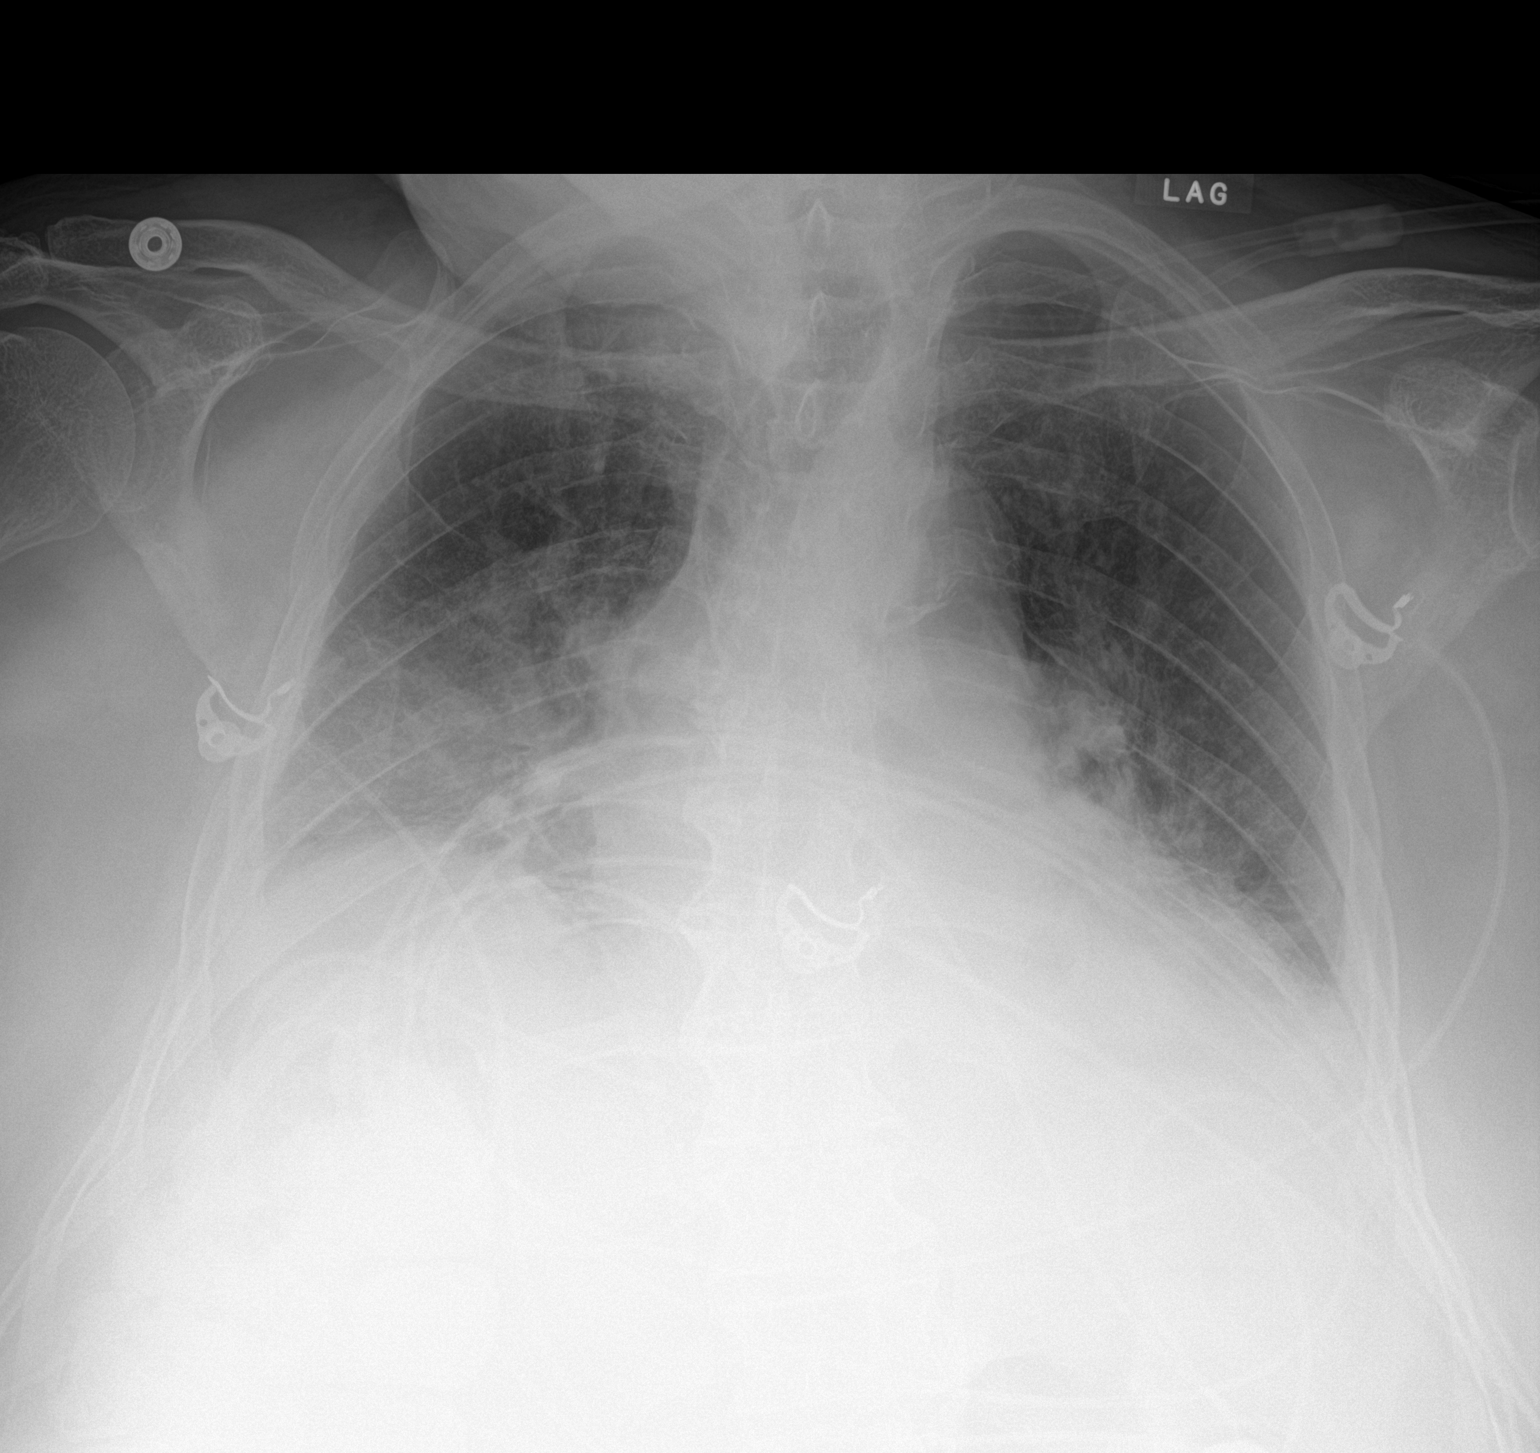

[1 of 1 positions shown; findings below may reference images not displayed]

FINDINGS: Cardiac shadow is enlarged in size but accentuated by the portable
technique. Aortic calcifications are again noted. Bibasilar
infiltrative changes and small effusions are noted. These have
increased slightly in the interval from the prior exam. No
pneumothorax is noted. No bony abnormality is noted.
IMPRESSION: Slight increase in bibasilar infiltrates and effusions.

## 2020-08-07 IMAGING — MR MR HEAD W/O CM
6 series · 48 of 48 positions shown · non-contrast
Comparison: Brain MRI 11/01/2017

CLINICAL DATA: Seizures and sepsis.

EXAM:
MRI HEAD WITHOUT CONTRAST
TECHNIQUE: Multiplanar, multiecho pulse sequences of the brain and surrounding
structures were obtained without intravenous contrast.

[Series 5: DWI · axial · 3.0mm · 0.88mm/px · z∈[-35,+98]mm · 16 of 92 slices shown (1 of 4)]
[im 1/92]
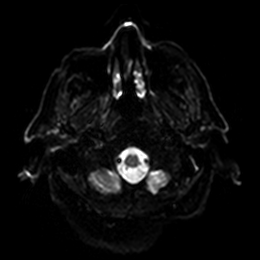
[im 7/92]
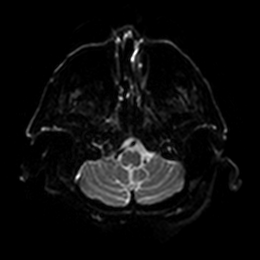
[im 13/92]
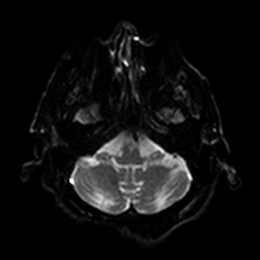
[im 19/92]
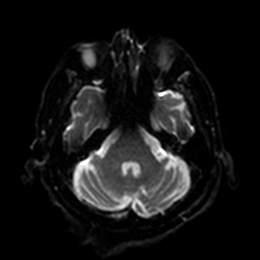
[im 25/92]
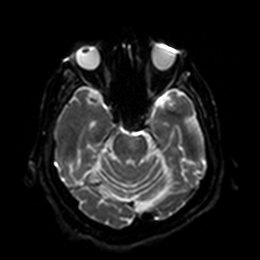
[im 31/92]
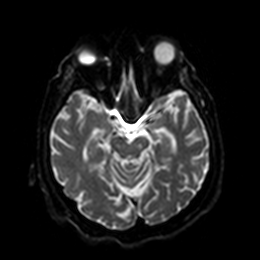
[im 37/92]
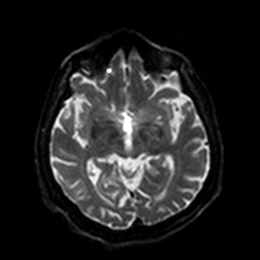
[im 43/92]
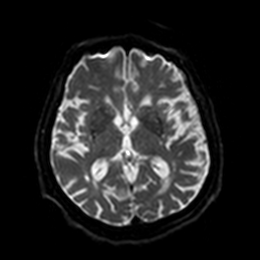
[im 49/92]
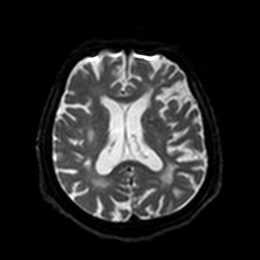
[im 55/92]
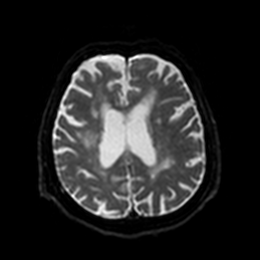
[im 61/92]
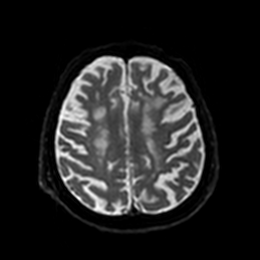
[im 67/92]
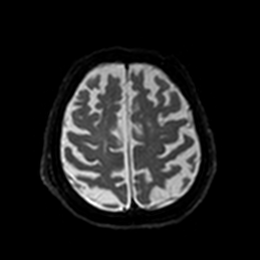
[im 73/92]
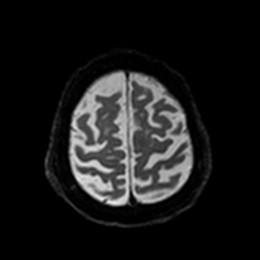
[im 79/92]
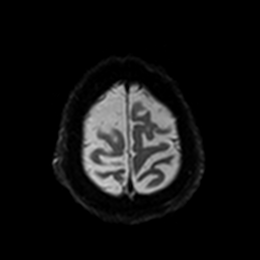
[im 85/92]
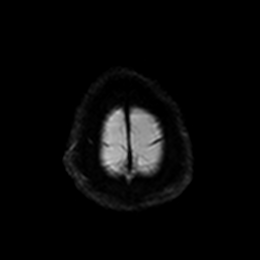
[im 92/92]
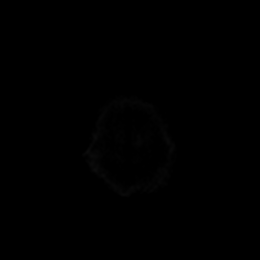

[Series 6: DWI · axial · 3.0mm · 0.88mm/px · z∈[-35,+98]mm · 8 of 46 slices shown (2 of 4)]
[im 1/46]
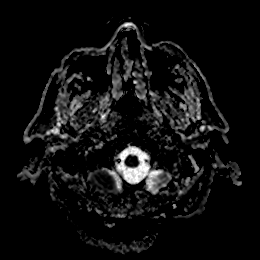
[im 7/46]
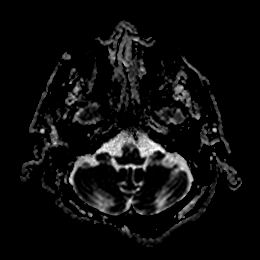
[im 13/46]
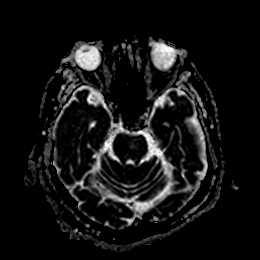
[im 20/46]
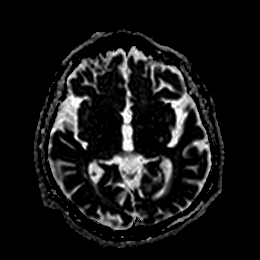
[im 26/46]
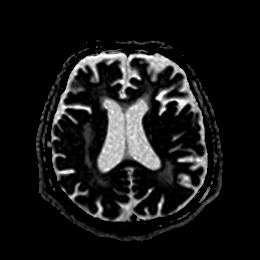
[im 33/46]
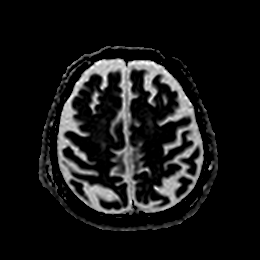
[im 39/46]
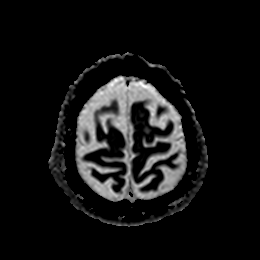
[im 46/46]
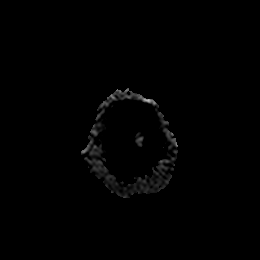

[Series 7: DWI · coronal · 4.0mm · 0.88mm/px · 11 of 64 slices shown (3 of 4)]
[im 1/64]
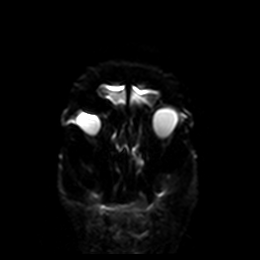
[im 7/64]
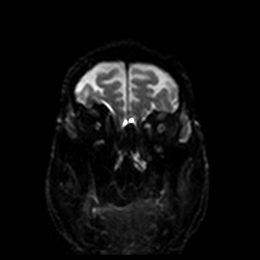
[im 13/64]
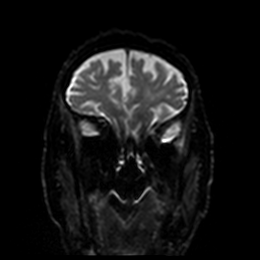
[im 19/64]
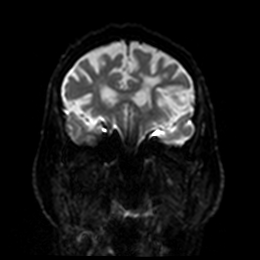
[im 26/64]
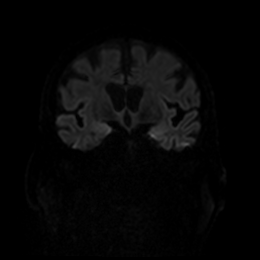
[im 32/64]
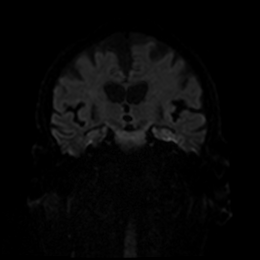
[im 38/64]
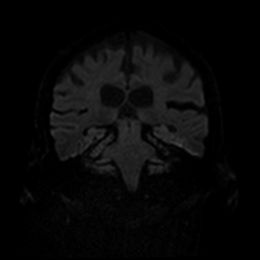
[im 45/64]
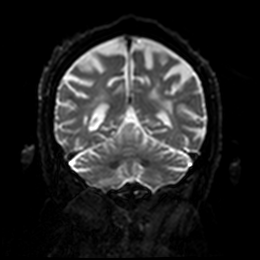
[im 51/64]
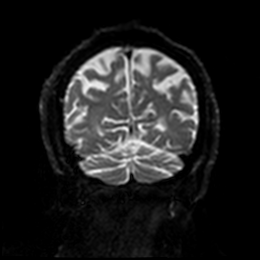
[im 57/64]
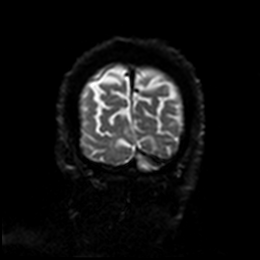
[im 64/64]
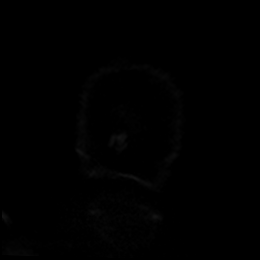

[Series 8: DWI · coronal · 4.0mm · 0.88mm/px · 5 of 32 slices shown (4 of 4)]
[im 1/32]
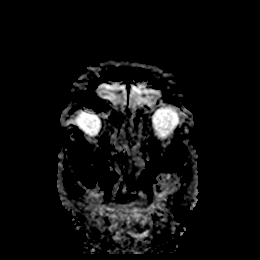
[im 8/32]
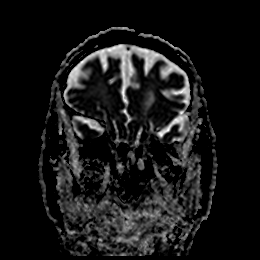
[im 16/32]
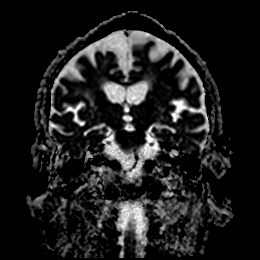
[im 24/32]
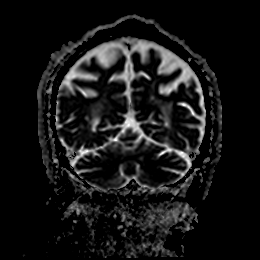
[im 32/32]
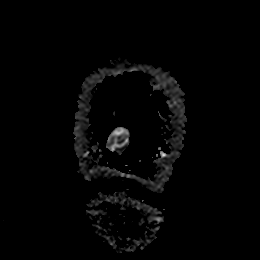

[Series 9: T1 · sagittal · 5.0mm · 0.75mm/px · 4 of 25 slices shown]
[im 1/25]
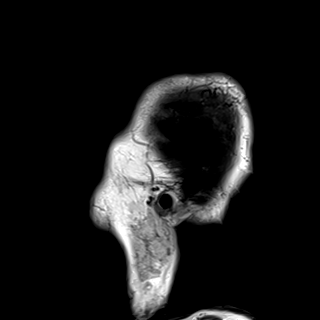
[im 9/25]
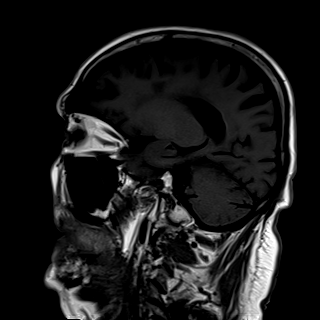
[im 17/25]
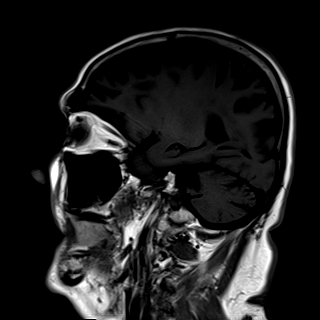
[im 25/25]
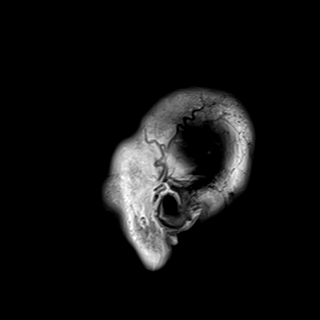

[Series 10: T2 · axial · 5.0mm · 0.72mm/px · z∈[-47,+100]mm · 4 of 26 slices shown]
[im 1/26]
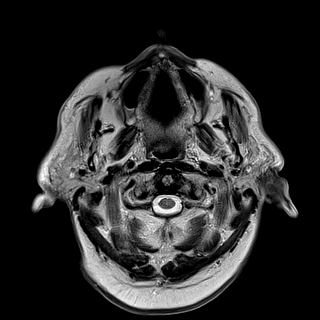
[im 9/26]
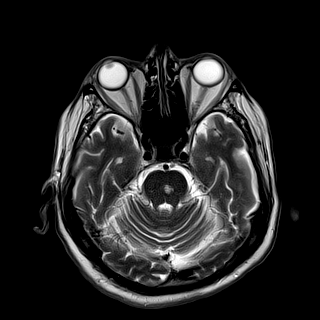
[im 17/26]
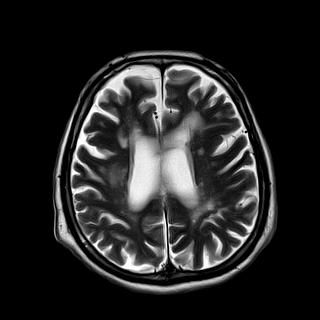
[im 26/26]
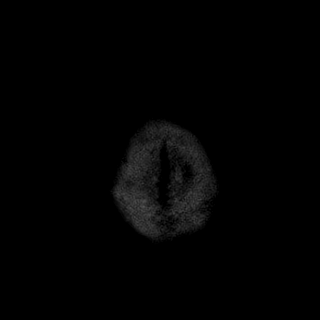

[48 of 48 positions shown; findings below may reference images not displayed]

FINDINGS: BRAIN: There is no acute infarct, acute hemorrhage, hydrocephalus or
extra-axial collection. The midline structures are normal. No
midline shift or other mass effect. Old pontine infarct and multiple
old lacunar infarcts of the periventricular white matter. Diffuse
confluent hyperintense T2-weighted signal within the
periventricular, deep and juxtacortical white matter, most commonly
due to chronic ischemic microangiopathy. Generalized atrophy without
lobar predilection. Susceptibility-sensitive sequences show no
chronic microhemorrhage or superficial siderosis.

VASCULAR: Major intracranial arterial and venous sinus flow voids
are normal.

SKULL AND UPPER CERVICAL SPINE: Calvarial bone marrow signal is
normal. There is no skull base mass. Visualized upper cervical spine
and soft tissues are normal.

SINUSES/ORBITS: No fluid levels or advanced mucosal thickening. No
mastoid or middle ear effusion. The orbits are normal.
IMPRESSION: Advanced chronic ischemic microangiopathy without acute intracranial
abnormality.

## 2020-08-08 IMAGING — DX DG CHEST 1V PORT
1 series · 1 of 1 positions shown · non-contrast
Comparison: Three days ago

CLINICAL DATA: Acute respiratory failure

EXAM:
PORTABLE CHEST 1 VIEW

[chest]
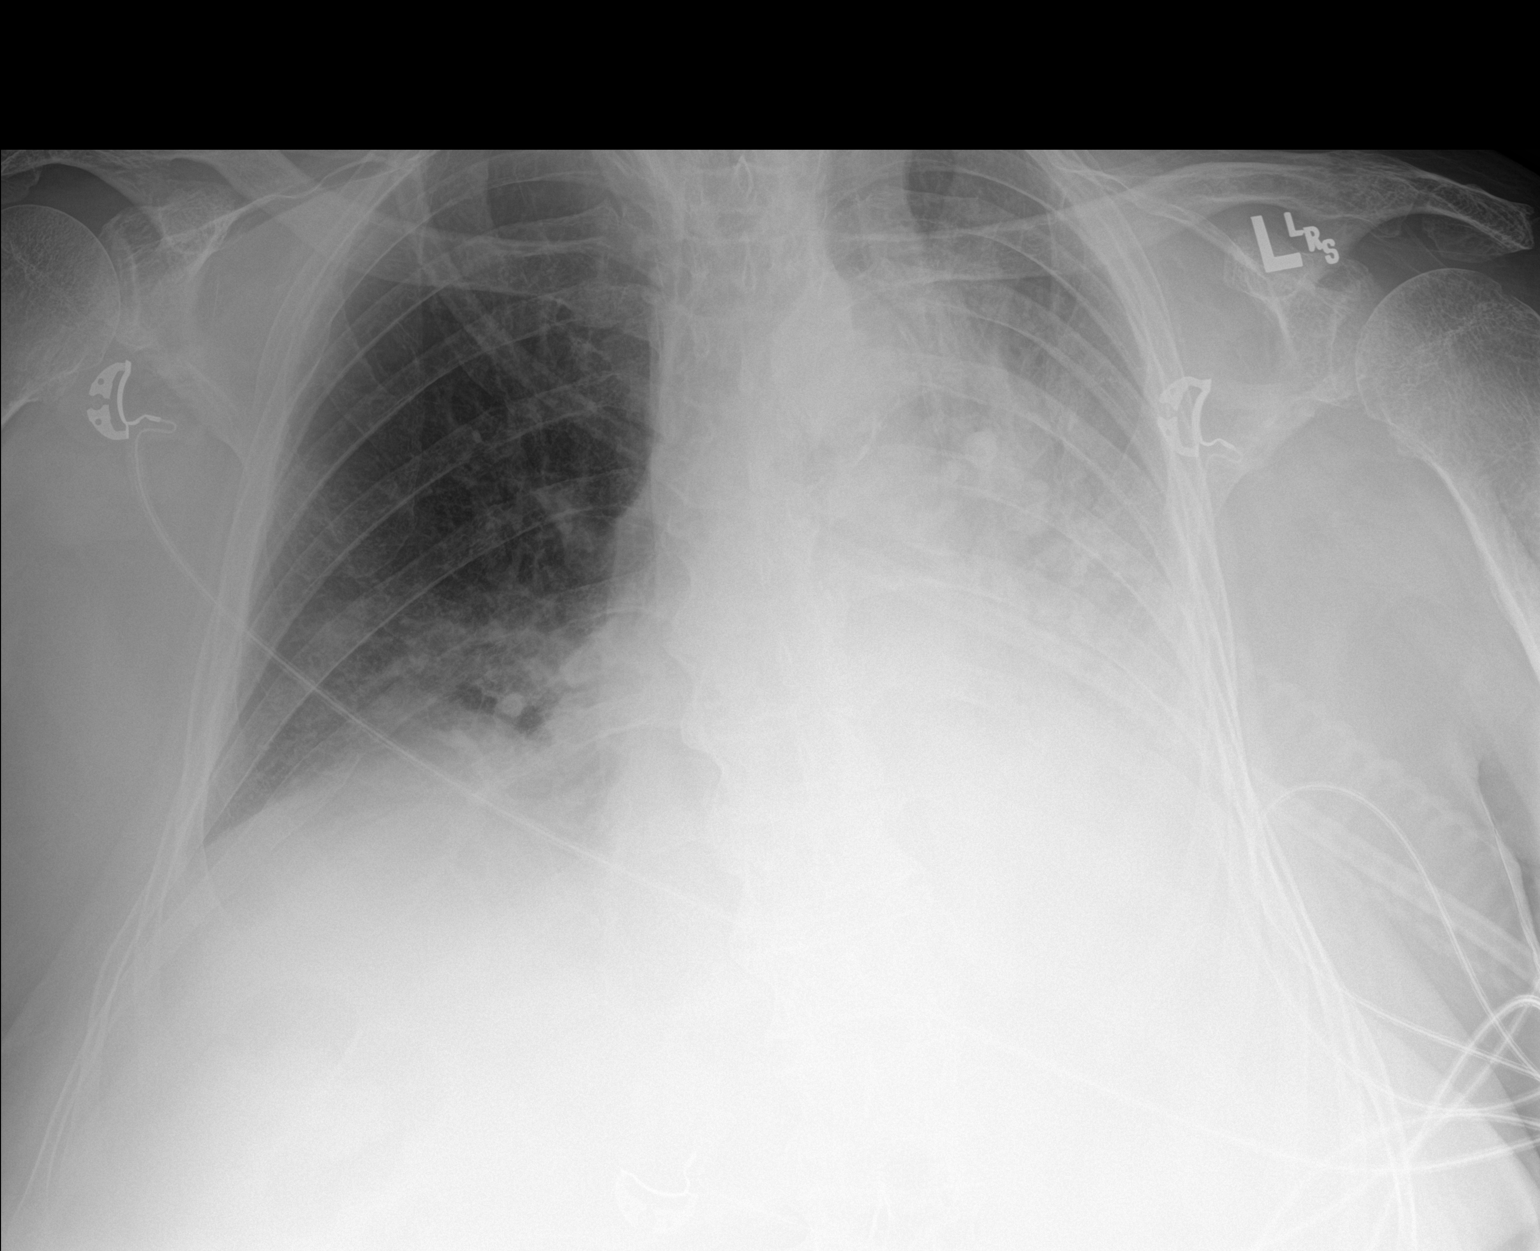

[1 of 1 positions shown; findings below may reference images not displayed]

FINDINGS: Worsening aeration with volume loss on the left. Similar indistinct
opacity at the right base. Probable cardiomegaly but extensive
obscuration by pulmonary opacity. No Kerley lines seen on the
aerated right. No pneumothorax.
IMPRESSION: Significant worsening aeration on the left with volume loss, suspect
interval lobar collapse.

## 2020-08-08 IMAGING — DX DG CHEST 1V PORT
1 series · 1 of 1 positions shown · non-contrast
Comparison: 02/11/2018 at 9967 hours

CLINICAL DATA: 67-year-old with endotracheal tube present.

EXAM:
PORTABLE CHEST 1 VIEW

[chest]
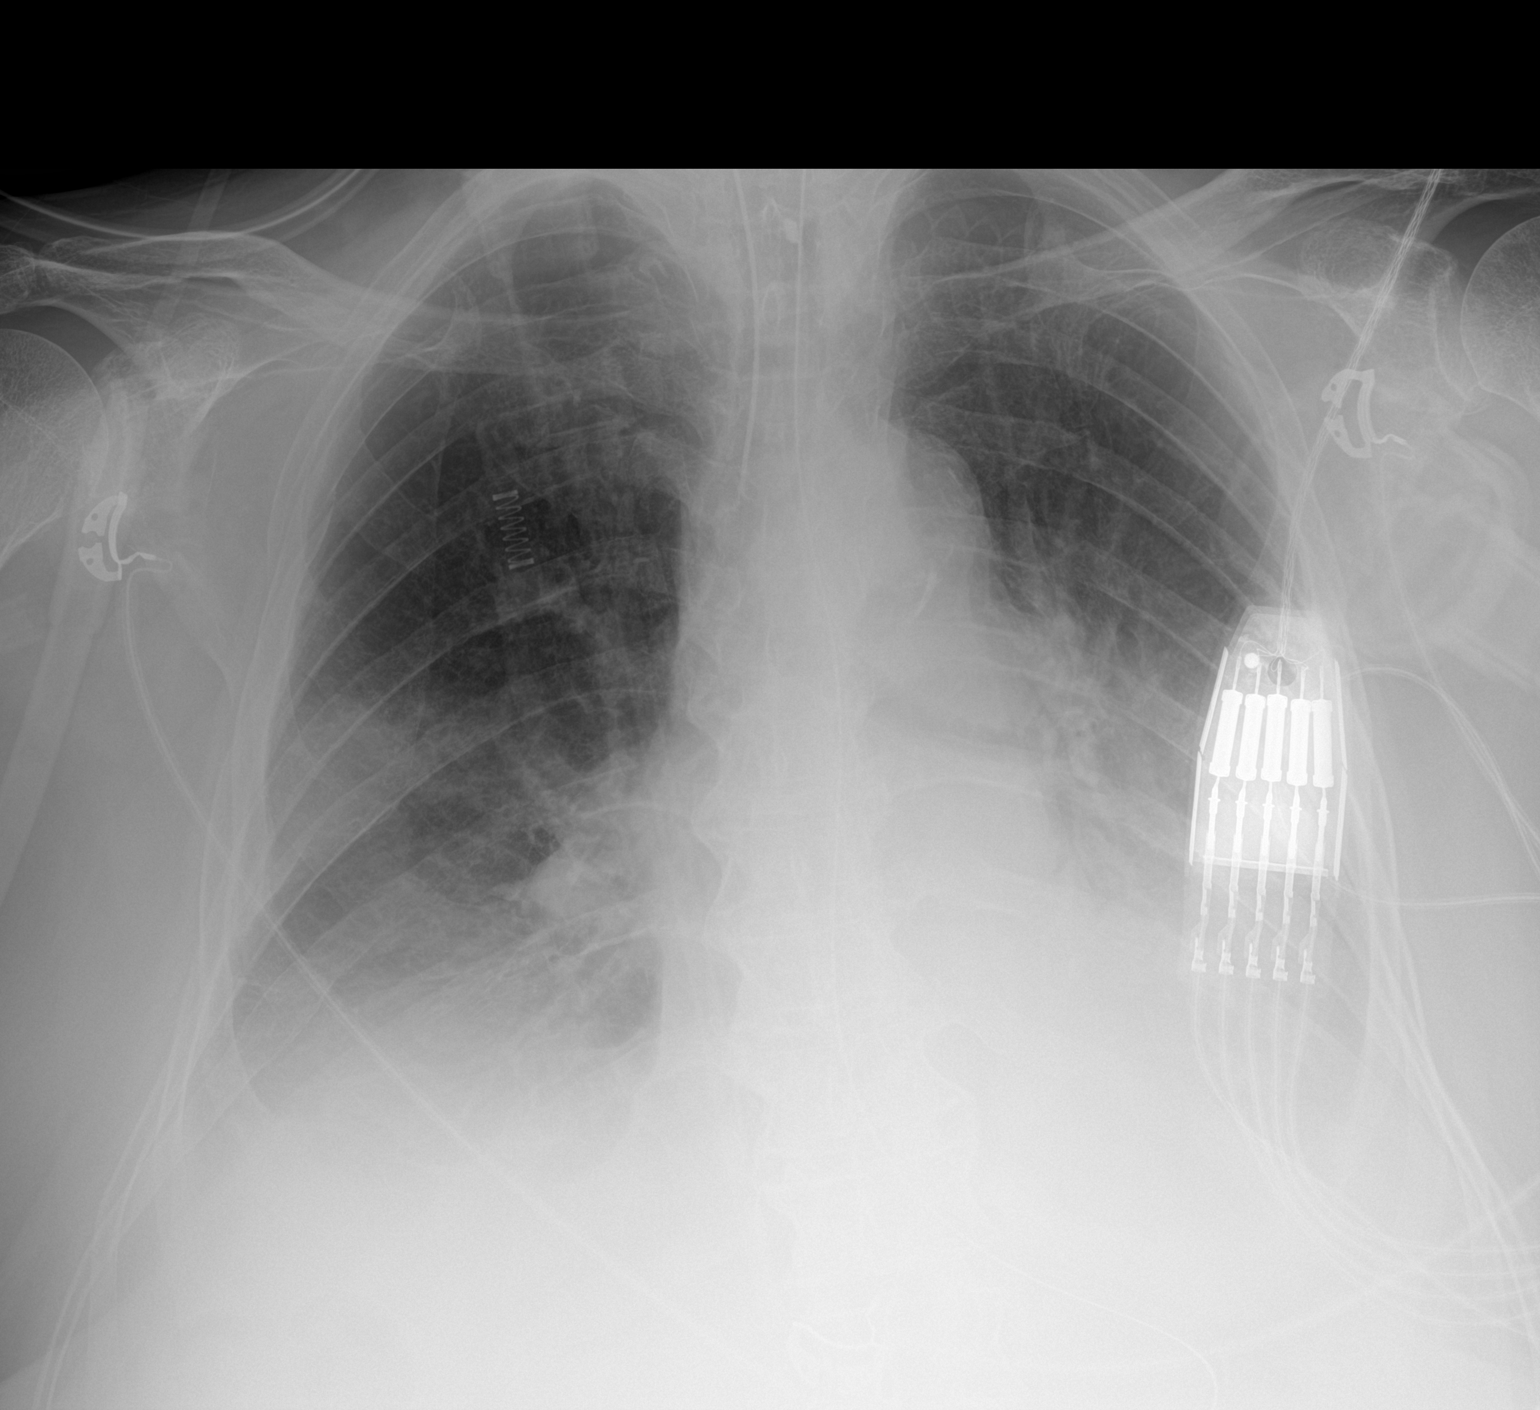

[1 of 1 positions shown; findings below may reference images not displayed]

FINDINGS: Endotracheal tube has been placed and the tube is 3.7 cm above the
carina. Nasogastric tube extends into the upper abdomen and the tip
is beyond the image. Improved aeration in the mid left lung but
there continues to be densities at the left lung base. Increased
densities in the mid and lower right chest. Negative for
pneumothorax. Cardiac silhouette is obscured by the lung densities.
IMPRESSION: 1. Endotracheal tube is appropriately positioned. Interval placement
of nasogastric tube.
2. Improved aeration in the left lung with residual left basilar
densities. Findings are suggestive for consolidation or airspace
disease with pleural effusions.
3. Increased densities in the mid and lower right chest. Findings
could represent asymmetric edema versus atelectasis or airspace
disease.

## 2020-08-09 IMAGING — DX DG CHEST 1V PORT
1 series · 1 of 1 positions shown · non-contrast
Comparison: Chest x-ray 02/11/2018.

CLINICAL DATA: 67-year-old male with history of shortness of
breath.

EXAM:
PORTABLE CHEST 1 VIEW

[chest ap]
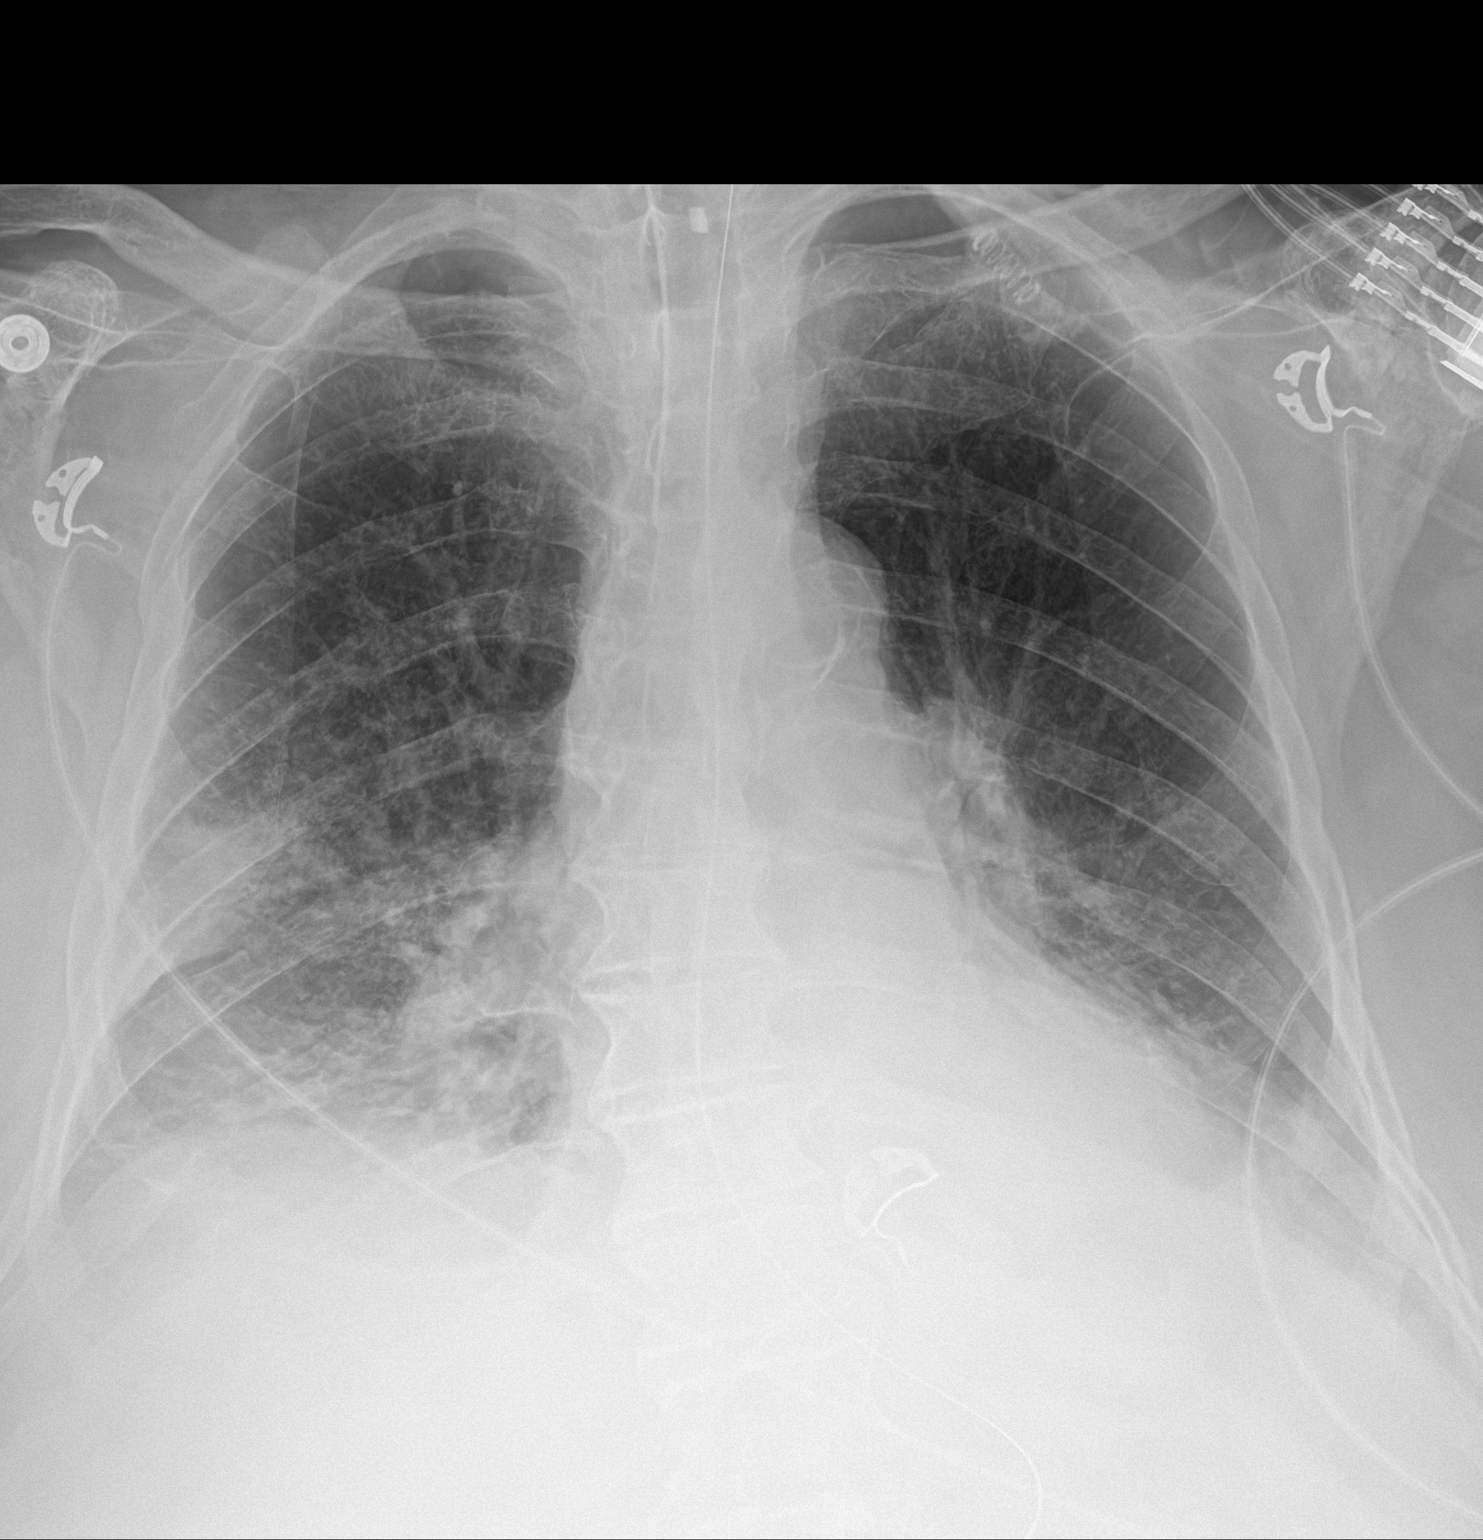

[1 of 1 positions shown; findings below may reference images not displayed]

FINDINGS: An endotracheal tube is in place with tip 5.1 cm above the carina. A
nasogastric tube is seen extending into the stomach, however, the
tip of the nasogastric tube extends below the lower margin of the
image. Lung volumes are low. Extensive opacities throughout the mid
to lower lungs bilaterally may reflect a combination of atelectasis
and/or airspace consolidation. Small bilateral pleural effusions. No
evidence of pulmonary edema. Heart size is normal. Upper mediastinal
contours are within normal limits.
IMPRESSION: 1. Support apparatus, as above.
2. Persistent areas of atelectasis and/or consolidation throughout
the mid to lower lungs bilaterally with small bilateral pleural
effusions.

## 2020-08-11 IMAGING — DX DG CHEST 1V PORT
1 series · 1 of 1 positions shown · non-contrast
Comparison: Portable chest x-ray of earlier today and February 12, 2018

CLINICAL DATA: Allergic reaction to the known substance. Patient is
intubated and on a ventilator secondary to acute respiratory
failure.

EXAM:
PORTABLE CHEST 1 VIEW

[chest ap]
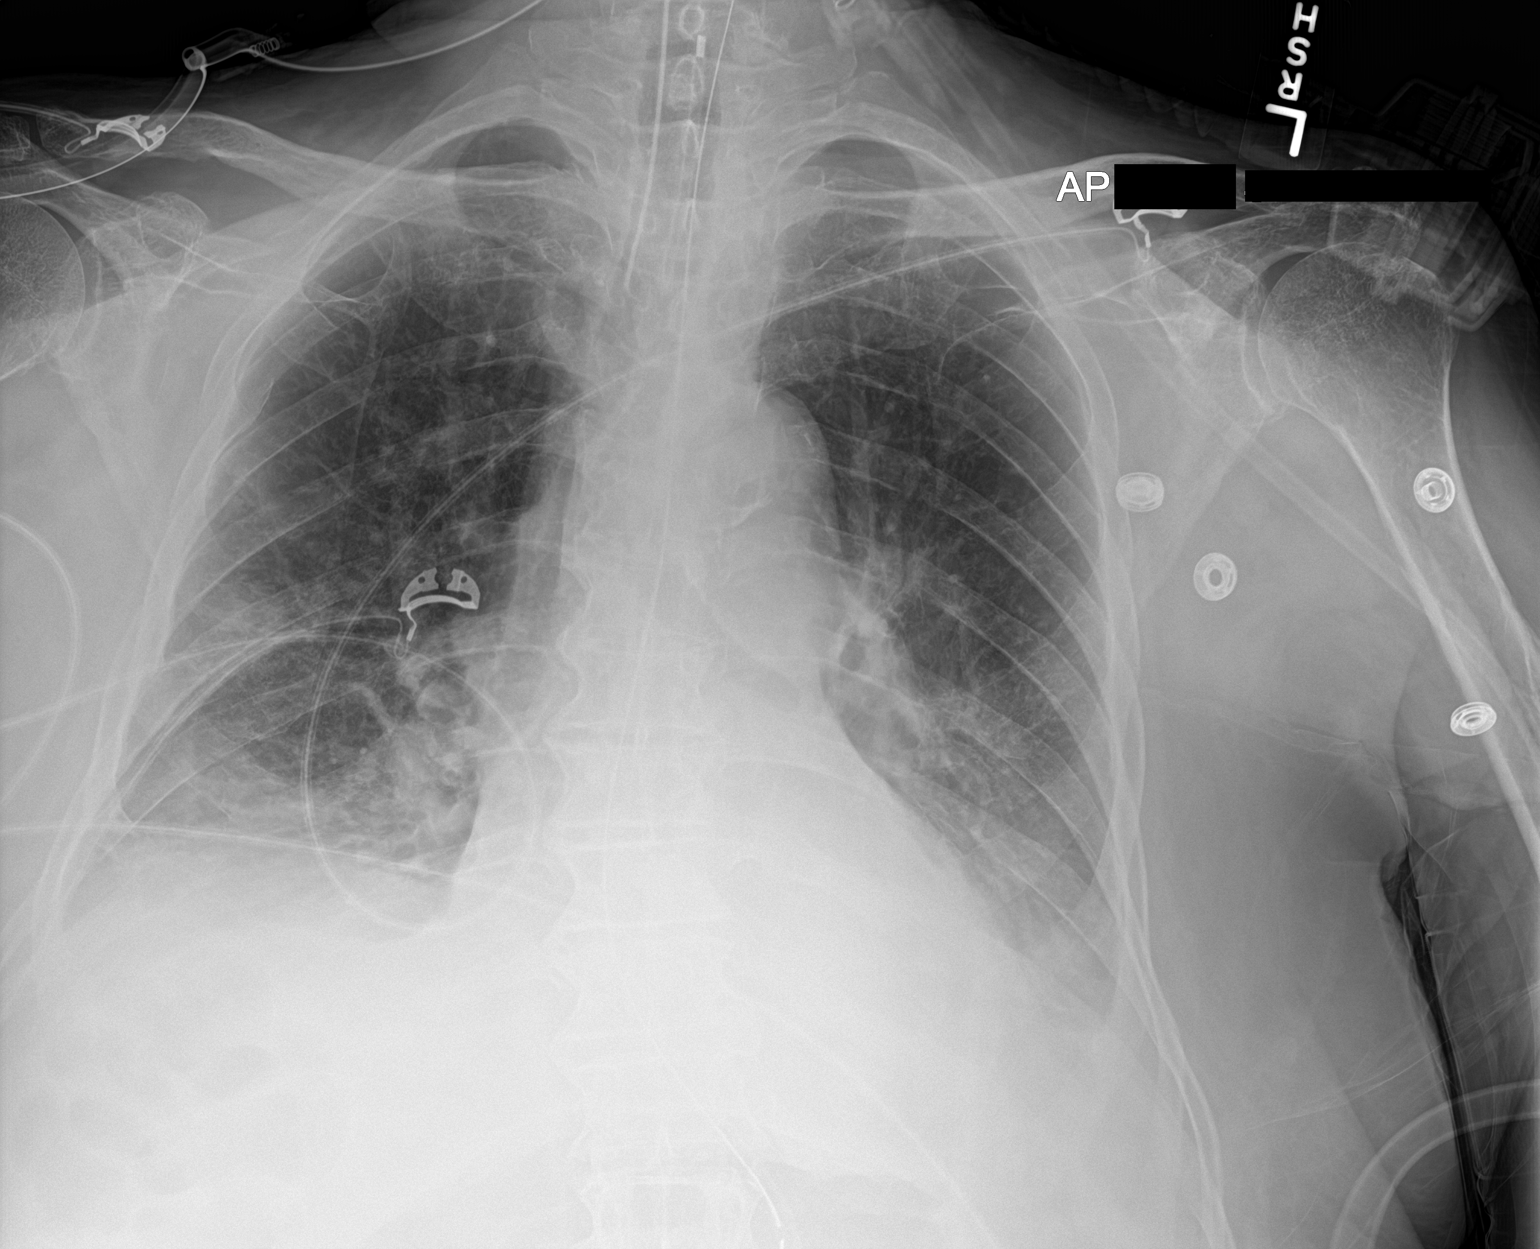

[1 of 1 positions shown; findings below may reference images not displayed]

FINDINGS: The lungs are adequately inflated. There are patchy bibasilar
densities greatest on the right. There is a trace of pleural fluid
bilaterally. The heart is normal in size. The pulmonary vascularity
is not clearly engorged. There is calcification in the wall of the
aortic arch. The endotracheal tube tip projects 7 cm above the
carina. The esophagogastric tube tip in proximal port project below
the GE junction.
IMPRESSION: Bibasilar atelectasis or pneumonia with small bilateral pleural
effusions not greatly changed since the study of earlier today and
fairly stable since the study of 2 days ago. The support tubes are
in position as described.

## 2020-08-11 IMAGING — DX DG CHEST 1V PORT
1 series · 1 of 1 positions shown · non-contrast
Comparison: 02/03/2017.

CLINICAL DATA: Acute respiratory failure.

EXAM:
PORTABLE CHEST 1 VIEW

[chest ap]
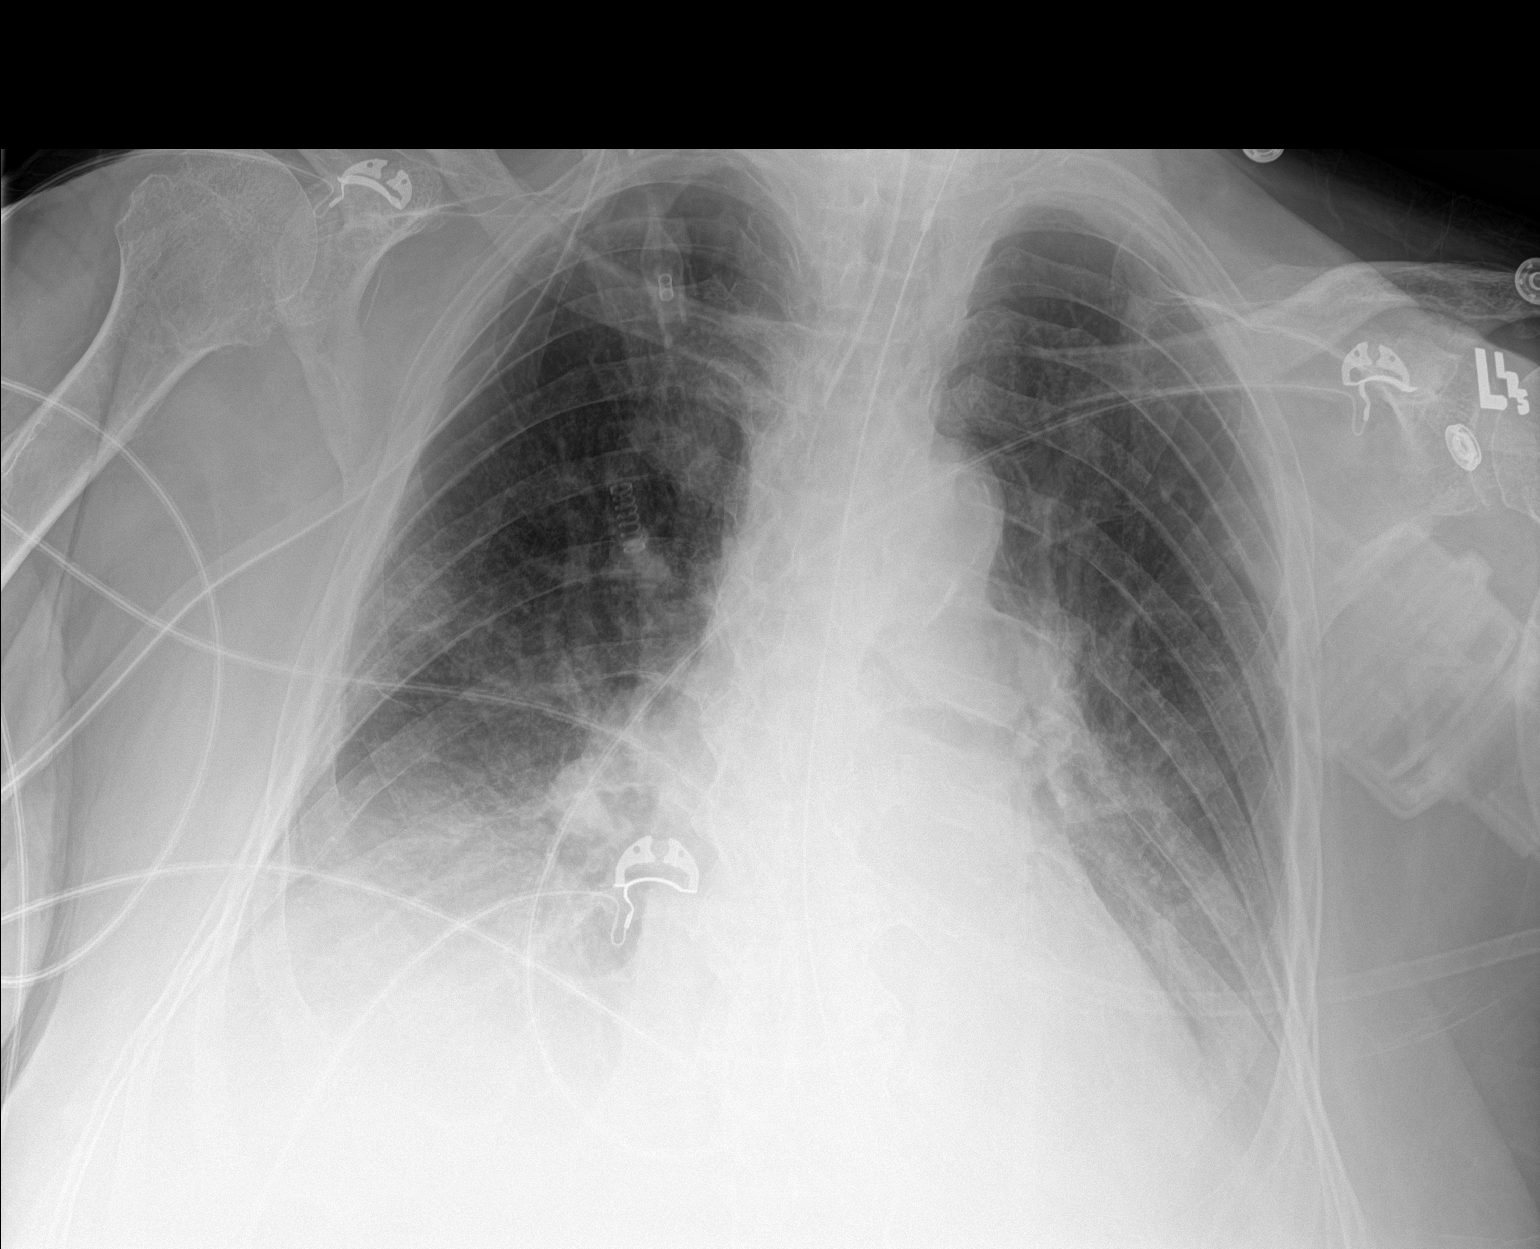

[1 of 1 positions shown; findings below may reference images not displayed]

FINDINGS: Endotracheal tube and NG tube in stable position. Heart size stable.
Bibasilar atelectasis/infiltrates again noted. Interim improvement
from prior exam. Small bilateral pleural effusions again noted. No
pneumothorax.
IMPRESSION: 1.  Lines and tubes stable position.

2. Bibasilar atelectasis/infiltrates again noted. Interim
improvement from prior exam. Small bilateral pleural effusions again
noted.
